# Patient Record
Sex: Male | Born: 1950
Health system: Southern US, Community
[De-identification: ages and names within clinical notes are randomized; demographics above are authoritative.]

## PROBLEM LIST (undated history)

## (undated) ENCOUNTER — Emergency Department (HOSPITAL_COMMUNITY): Discharge status: Against Medical Advice | Payer: Self-pay

## (undated) DIAGNOSIS — E039 Hypothyroidism, unspecified: Secondary | ICD-10-CM

## (undated) DIAGNOSIS — C61 Malignant neoplasm of prostate: Secondary | ICD-10-CM

## (undated) DIAGNOSIS — E119 Type 2 diabetes mellitus without complications: Secondary | ICD-10-CM

## (undated) DIAGNOSIS — R269 Unspecified abnormalities of gait and mobility: Secondary | ICD-10-CM

## (undated) DIAGNOSIS — I10 Essential (primary) hypertension: Secondary | ICD-10-CM

## (undated) DIAGNOSIS — T8781 Dehiscence of amputation stump: Secondary | ICD-10-CM

## (undated) DIAGNOSIS — I499 Cardiac arrhythmia, unspecified: Secondary | ICD-10-CM

## (undated) DIAGNOSIS — M109 Gout, unspecified: Secondary | ICD-10-CM

## (undated) DIAGNOSIS — I2510 Atherosclerotic heart disease of native coronary artery without angina pectoris: Secondary | ICD-10-CM

## (undated) DIAGNOSIS — Z9989 Dependence on other enabling machines and devices: Secondary | ICD-10-CM

## (undated) DIAGNOSIS — G959 Disease of spinal cord, unspecified: Secondary | ICD-10-CM

## (undated) DIAGNOSIS — R112 Nausea with vomiting, unspecified: Secondary | ICD-10-CM

## (undated) DIAGNOSIS — E1140 Type 2 diabetes mellitus with diabetic neuropathy, unspecified: Secondary | ICD-10-CM

## (undated) DIAGNOSIS — M86172 Other acute osteomyelitis, left ankle and foot: Secondary | ICD-10-CM

## (undated) DIAGNOSIS — E11621 Type 2 diabetes mellitus with foot ulcer: Secondary | ICD-10-CM

## (undated) DIAGNOSIS — Z87442 Personal history of urinary calculi: Secondary | ICD-10-CM

## (undated) DIAGNOSIS — I739 Peripheral vascular disease, unspecified: Secondary | ICD-10-CM

## (undated) DIAGNOSIS — K219 Gastro-esophageal reflux disease without esophagitis: Secondary | ICD-10-CM

## (undated) DIAGNOSIS — Z9289 Personal history of other medical treatment: Secondary | ICD-10-CM

## (undated) DIAGNOSIS — T8859XA Other complications of anesthesia, initial encounter: Secondary | ICD-10-CM

## (undated) DIAGNOSIS — E7800 Pure hypercholesterolemia, unspecified: Secondary | ICD-10-CM

## (undated) DIAGNOSIS — G629 Polyneuropathy, unspecified: Secondary | ICD-10-CM

## (undated) DIAGNOSIS — D649 Anemia, unspecified: Secondary | ICD-10-CM

## (undated) DIAGNOSIS — G709 Myoneural disorder, unspecified: Secondary | ICD-10-CM

## (undated) DIAGNOSIS — N189 Chronic kidney disease, unspecified: Secondary | ICD-10-CM

## (undated) DIAGNOSIS — Z9889 Other specified postprocedural states: Secondary | ICD-10-CM

## (undated) HISTORY — DX: Dehiscence of amputation stump: T87.81

## (undated) HISTORY — DX: Type 2 diabetes mellitus without complications: E11.9

## (undated) HISTORY — PX: BACK SURGERY: SHX140

## (undated) HISTORY — PX: NECK SURGERY: SHX720

## (undated) HISTORY — DX: Other acute osteomyelitis, left ankle and foot: M86.172

## (undated) HISTORY — PX: WISDOM TOOTH EXTRACTION: SHX21

## (undated) HISTORY — PX: CHOLECYSTECTOMY: SHX55

## (undated) HISTORY — DX: Disease of spinal cord, unspecified: G95.9

## (undated) HISTORY — DX: Type 2 diabetes mellitus with diabetic neuropathy, unspecified: E11.40

## (undated) HISTORY — PX: CATARACT EXTRACTION: SUR2

---
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
## 2006-04-08 HISTORY — PX: CORONARY ARTERY BYPASS GRAFT: SHX141

## 2009-04-08 HISTORY — PX: COLONOSCOPY: SHX174

## 2012-09-14 ENCOUNTER — Inpatient Hospital Stay (HOSPITAL_COMMUNITY)
Admission: EM | Admit: 2012-09-14 | Discharge: 2012-09-16 | DRG: 638 | Disposition: A | Discharge status: Home / Self Care | Payer: Managed Care, Other (non HMO) | Attending: Internal Medicine | Admitting: Internal Medicine

## 2012-09-14 ENCOUNTER — Inpatient Hospital Stay (HOSPITAL_COMMUNITY): Payer: Managed Care, Other (non HMO)

## 2012-09-14 ENCOUNTER — Emergency Department (HOSPITAL_COMMUNITY): Payer: Managed Care, Other (non HMO)

## 2012-09-14 ENCOUNTER — Encounter (HOSPITAL_COMMUNITY): Payer: Self-pay | Admitting: Emergency Medicine

## 2012-09-14 DIAGNOSIS — L02619 Cutaneous abscess of unspecified foot: Secondary | ICD-10-CM | POA: Diagnosis present

## 2012-09-14 DIAGNOSIS — M109 Gout, unspecified: Secondary | ICD-10-CM | POA: Diagnosis present

## 2012-09-14 DIAGNOSIS — Z79899 Other long term (current) drug therapy: Secondary | ICD-10-CM

## 2012-09-14 DIAGNOSIS — E1149 Type 2 diabetes mellitus with other diabetic neurological complication: Secondary | ICD-10-CM | POA: Diagnosis present

## 2012-09-14 DIAGNOSIS — I10 Essential (primary) hypertension: Secondary | ICD-10-CM | POA: Diagnosis present

## 2012-09-14 DIAGNOSIS — E119 Type 2 diabetes mellitus without complications: Secondary | ICD-10-CM | POA: Diagnosis present

## 2012-09-14 DIAGNOSIS — E1159 Type 2 diabetes mellitus with other circulatory complications: Secondary | ICD-10-CM | POA: Diagnosis present

## 2012-09-14 DIAGNOSIS — Z87891 Personal history of nicotine dependence: Secondary | ICD-10-CM

## 2012-09-14 DIAGNOSIS — E039 Hypothyroidism, unspecified: Secondary | ICD-10-CM | POA: Diagnosis present

## 2012-09-14 DIAGNOSIS — M216X9 Other acquired deformities of unspecified foot: Secondary | ICD-10-CM | POA: Diagnosis present

## 2012-09-14 DIAGNOSIS — E1142 Type 2 diabetes mellitus with diabetic polyneuropathy: Secondary | ICD-10-CM | POA: Diagnosis present

## 2012-09-14 DIAGNOSIS — G629 Polyneuropathy, unspecified: Secondary | ICD-10-CM | POA: Diagnosis present

## 2012-09-14 DIAGNOSIS — E11621 Type 2 diabetes mellitus with foot ulcer: Secondary | ICD-10-CM | POA: Diagnosis present

## 2012-09-14 DIAGNOSIS — E10621 Type 1 diabetes mellitus with foot ulcer: Secondary | ICD-10-CM

## 2012-09-14 DIAGNOSIS — E1165 Type 2 diabetes mellitus with hyperglycemia: Principal | ICD-10-CM | POA: Diagnosis present

## 2012-09-14 DIAGNOSIS — IMO0002 Reserved for concepts with insufficient information to code with codable children: Principal | ICD-10-CM | POA: Diagnosis present

## 2012-09-14 DIAGNOSIS — I2510 Atherosclerotic heart disease of native coronary artery without angina pectoris: Secondary | ICD-10-CM | POA: Diagnosis present

## 2012-09-14 DIAGNOSIS — E1169 Type 2 diabetes mellitus with other specified complication: Secondary | ICD-10-CM | POA: Diagnosis present

## 2012-09-14 DIAGNOSIS — L0390 Cellulitis, unspecified: Secondary | ICD-10-CM

## 2012-09-14 DIAGNOSIS — M9080 Osteopathy in diseases classified elsewhere, unspecified site: Secondary | ICD-10-CM | POA: Diagnosis present

## 2012-09-14 DIAGNOSIS — L97509 Non-pressure chronic ulcer of other part of unspecified foot with unspecified severity: Secondary | ICD-10-CM | POA: Diagnosis present

## 2012-09-14 DIAGNOSIS — L0291 Cutaneous abscess, unspecified: Secondary | ICD-10-CM

## 2012-09-14 DIAGNOSIS — Z951 Presence of aortocoronary bypass graft: Secondary | ICD-10-CM

## 2012-09-14 DIAGNOSIS — E7800 Pure hypercholesterolemia, unspecified: Secondary | ICD-10-CM | POA: Diagnosis present

## 2012-09-14 DIAGNOSIS — E785 Hyperlipidemia, unspecified: Secondary | ICD-10-CM | POA: Diagnosis present

## 2012-09-14 DIAGNOSIS — M869 Osteomyelitis, unspecified: Secondary | ICD-10-CM | POA: Diagnosis present

## 2012-09-14 DIAGNOSIS — F40298 Other specified phobia: Secondary | ICD-10-CM | POA: Diagnosis present

## 2012-09-14 HISTORY — DX: Atherosclerotic heart disease of native coronary artery without angina pectoris: I25.10

## 2012-09-14 HISTORY — DX: Essential (primary) hypertension: I10

## 2012-09-14 HISTORY — DX: Pure hypercholesterolemia, unspecified: E78.00

## 2012-09-14 HISTORY — DX: Polyneuropathy, unspecified: G62.9

## 2012-09-14 LAB — CBC WITH DIFFERENTIAL/PLATELET
Eosinophils Absolute: 0.1 10*3/uL (ref 0.0–0.7)
Eosinophils Relative: 2 % (ref 0–5)
HCT: 37.2 % — ABNORMAL LOW (ref 39.0–52.0)
Hemoglobin: 13 g/dL (ref 13.0–17.0)
Lymphs Abs: 1 10*3/uL (ref 0.7–4.0)
MCH: 29.3 pg (ref 26.0–34.0)
MCV: 83.8 fL (ref 78.0–100.0)
Monocytes Absolute: 0.5 10*3/uL (ref 0.1–1.0)
Monocytes Relative: 8 % (ref 3–12)
Platelets: 152 10*3/uL (ref 150–400)
RBC: 4.44 MIL/uL (ref 4.22–5.81)

## 2012-09-14 LAB — BASIC METABOLIC PANEL
BUN: 33 mg/dL — ABNORMAL HIGH (ref 6–23)
CO2: 26 mEq/L (ref 19–32)
Calcium: 9.3 mg/dL (ref 8.4–10.5)
Creatinine, Ser: 1.74 mg/dL — ABNORMAL HIGH (ref 0.50–1.35)
GFR calc non Af Amer: 40 mL/min — ABNORMAL LOW (ref >90)
Glucose, Bld: 291 mg/dL — ABNORMAL HIGH (ref 70–99)

## 2012-09-14 MED ORDER — PIPERACILLIN-TAZOBACTAM 3.375 G IVPB
3.3750 g | Freq: Once | INTRAVENOUS | Status: AC
  Administered 2012-09-14: 3.375 g via INTRAVENOUS
  Filled 2012-09-14: qty 50

## 2012-09-14 MED ORDER — ACETAMINOPHEN 650 MG RE SUPP: 650.0000 mg | Freq: Four times a day (QID) | RECTAL | Status: DC | PRN

## 2012-09-14 MED ORDER — VANCOMYCIN HCL IN DEXTROSE 1-5 GM/200ML-% IV SOLN
1000.0000 mg | Freq: Once | INTRAVENOUS | Status: AC
  Administered 2012-09-14: 1000 mg via INTRAVENOUS
  Filled 2012-09-14: qty 200

## 2012-09-14 MED ORDER — ATORVASTATIN CALCIUM 20 MG PO TABS
20.0000 mg | ORAL_TABLET | Freq: Every day | ORAL | Status: DC
  Administered 2012-09-15: 20 mg via ORAL
  Filled 2012-09-14 (×2): qty 1

## 2012-09-14 MED ORDER — AMLODIPINE BESYLATE 5 MG PO TABS
5.0000 mg | ORAL_TABLET | Freq: Every day | ORAL | Status: DC
  Administered 2012-09-15: 5 mg via ORAL
  Filled 2012-09-14: qty 1

## 2012-09-14 MED ORDER — CIPROFLOXACIN IN D5W 400 MG/200ML IV SOLN
400.0000 mg | Freq: Two times a day (BID) | INTRAVENOUS | Status: DC
  Administered 2012-09-15 – 2012-09-16 (×4): 400 mg via INTRAVENOUS
  Filled 2012-09-14 (×5): qty 200

## 2012-09-14 MED ORDER — ONDANSETRON HCL 4 MG/2ML IJ SOLN: 4.0000 mg | Freq: Four times a day (QID) | INTRAMUSCULAR | Status: DC | PRN

## 2012-09-14 MED ORDER — LEVOTHYROXINE SODIUM 75 MCG PO TABS
75.0000 ug | ORAL_TABLET | Freq: Every day | ORAL | Status: DC
  Filled 2012-09-14 (×3): qty 1

## 2012-09-14 MED ORDER — AMLODIPINE BESY-BENAZEPRIL HCL 5-10 MG PO CAPS: 1.0000 | ORAL_CAPSULE | Freq: Every day | ORAL | Status: DC

## 2012-09-14 MED ORDER — BENAZEPRIL HCL 10 MG PO TABS
10.0000 mg | ORAL_TABLET | Freq: Every day | ORAL | Status: DC
  Administered 2012-09-15 – 2012-09-16 (×2): 10 mg via ORAL
  Filled 2012-09-14 (×2): qty 1

## 2012-09-14 MED ORDER — ALLOPURINOL 300 MG PO TABS
300.0000 mg | ORAL_TABLET | Freq: Every day | ORAL | Status: DC
  Administered 2012-09-15 – 2012-09-16 (×2): 300 mg via ORAL
  Filled 2012-09-14 (×2): qty 1

## 2012-09-14 MED ORDER — VANCOMYCIN HCL 500 MG IV SOLR
500.0000 mg | Freq: Once | INTRAVENOUS | Status: AC
  Administered 2012-09-15: 500 mg via INTRAVENOUS
  Filled 2012-09-14: qty 500

## 2012-09-14 MED ORDER — ASPIRIN 81 MG PO CHEW
81.0000 mg | CHEWABLE_TABLET | Freq: Every day | ORAL | Status: DC
  Administered 2012-09-15 – 2012-09-16 (×2): 81 mg via ORAL
  Filled 2012-09-14 (×2): qty 1

## 2012-09-14 MED ORDER — GABAPENTIN 600 MG PO TABS
1200.0000 mg | ORAL_TABLET | Freq: Two times a day (BID) | ORAL | Status: DC
  Administered 2012-09-15 – 2012-09-16 (×4): 1200 mg via ORAL
  Filled 2012-09-14 (×5): qty 2

## 2012-09-14 MED ORDER — SODIUM CHLORIDE 0.9 % IV SOLN
INTRAVENOUS | Status: AC
  Administered 2012-09-15 (×2): via INTRAVENOUS

## 2012-09-14 MED ORDER — ONDANSETRON HCL 4 MG PO TABS: 4.0000 mg | ORAL_TABLET | Freq: Four times a day (QID) | ORAL | Status: DC | PRN

## 2012-09-14 MED ORDER — INSULIN ASPART 100 UNIT/ML ~~LOC~~ SOLN
0.0000 [IU] | Freq: Three times a day (TID) | SUBCUTANEOUS | Status: DC
  Administered 2012-09-15: 3 [IU] via SUBCUTANEOUS
  Administered 2012-09-15: 7 [IU] via SUBCUTANEOUS
  Administered 2012-09-15: 2 [IU] via SUBCUTANEOUS
  Administered 2012-09-16: 3 [IU] via SUBCUTANEOUS

## 2012-09-14 MED ORDER — ACETAMINOPHEN 325 MG PO TABS
650.0000 mg | ORAL_TABLET | Freq: Four times a day (QID) | ORAL | Status: DC | PRN
  Administered 2012-09-15 – 2012-09-16 (×2): 650 mg via ORAL
  Filled 2012-09-14: qty 1
  Filled 2012-09-14: qty 2

## 2012-09-14 MED ORDER — VANCOMYCIN HCL 10 G IV SOLR
1500.0000 mg | INTRAVENOUS | Status: DC
  Administered 2012-09-16: 1500 mg via INTRAVENOUS
  Filled 2012-09-14 (×2): qty 1500

## 2012-09-14 MED ORDER — PANTOPRAZOLE SODIUM 40 MG PO TBEC
40.0000 mg | DELAYED_RELEASE_TABLET | Freq: Every day | ORAL | Status: DC
  Administered 2012-09-15: 40 mg via ORAL

## 2012-09-14 NOTE — Progress Notes (Signed)
ANTIBIOTIC CONSULT NOTE - INITIAL  Pharmacy Consult for Vancocin and Cipro Indication: cellulitis  Allergies  Allergen Reactions  . Mushroom Extract Complex Nausea Only    Patient Measurements: Height: 5\' 10"  (177.8 cm) Weight: 225 lb (102.059 kg) IBW/kg (Calculated) : 73  Vital Signs: Temp: 98.6 F (37 C) (06/09 2303) Temp src: Oral (06/09 2303) BP: 156/91 mmHg (06/09 2303) Pulse Rate: 80 (06/09 2303)  Labs:  Recent Labs  09/14/12 1855  WBC 6.8  HGB 13.0  PLT 152  CREATININE 1.74*   Estimated Creatinine Clearance: 52 ml/min (by C-G formula based on Cr of 1.74).   Microbiology: No results found for this or any previous visit (from the past 720 hour(s)).  Medical History: Past Medical History  Diagnosis Date  . Coronary artery disease   . Hypertension   . Hypercholesteremia   . Neuropathy     Medications:  Prescriptions prior to admission  Medication Sig Dispense Refill  . allopurinol (ZYLOPRIM) 300 MG tablet Take 300 mg by mouth daily.      Marland Kitchen amLODipine-benazepril (LOTREL) 5-10 MG per capsule Take 1 capsule by mouth daily.      Marland Kitchen aspirin 81 MG chewable tablet Chew 81 mg by mouth daily.      . celecoxib (CELEBREX) 100 MG capsule Take 100 mg by mouth daily.      Marland Kitchen esomeprazole (NEXIUM) 40 MG capsule Take 40 mg by mouth daily before breakfast.      . hydrochlorothiazide (HYDRODIURIL) 25 MG tablet Take 25 mg by mouth daily.      Marland Kitchen levothyroxine (SYNTHROID, LEVOTHROID) 75 MCG tablet Take 75 mcg by mouth daily before breakfast.      . metFORMIN (GLUCOPHAGE) 500 MG tablet Take 500 mg by mouth 2 (two) times daily with a meal.      . Pitavastatin Calcium (LIVALO) 4 MG TABS Take 4 mg by mouth 2 (two) times a week.      . tadalafil (CIALIS) 20 MG tablet Take 20 mg by mouth daily as needed for erectile dysfunction.       Scheduled:  . [START ON 09/15/2012] allopurinol  300 mg Oral Daily  . [START ON 09/15/2012] amLODipine  5 mg Oral Daily  . [START ON 09/15/2012]  aspirin  81 mg Oral Daily  . [START ON 09/15/2012] atorvastatin  20 mg Oral q1800  . [START ON 09/15/2012] benazepril  10 mg Oral Daily  . ciprofloxacin  400 mg Intravenous Q12H  . [START ON 09/15/2012] insulin aspart  0-9 Units Subcutaneous TID WC  . [START ON 09/15/2012] levothyroxine  75 mcg Oral QAC breakfast  . [START ON 09/15/2012] pantoprazole  40 mg Oral Q1200  . piperacillin-tazobactam (ZOSYN)  IV  3.375 g Intravenous Once  . [START ON 09/15/2012] vancomycin  1,500 mg Intravenous Q24H  . vancomycin  1,000 mg Intravenous Once   Infusions:  . sodium chloride      Assessment: 62yo male c/o left foot pain and ulcer x6wk, pt says today wound worsened with pus and redness/warmth, to begin IV ABX for cellulitis.  Goal of Therapy:  Vancomycin trough level 10-15 mcg/ml  Plan:  Rec'd Zosyn 3.375g IV in ED; will begin vancomycin 1500mg  IV Q24H and Cipro 400mg  IV Q12H and monitor CBC, Cx, levels prn.  Wynona Neat, PharmD, BCPS  09/14/2012,11:05 PM

## 2012-09-14 NOTE — ED Provider Notes (Signed)
History     CSN: [id number]  Arrival date & time 09/14/12  1801   First MD Initiated Contact with Patient 09/14/12 1814      Chief Complaint  Patient presents with  . Foot Pain    (Consider location/radiation/quality/duration/timing/severity/associated sxs/prior treatment) HPI Comments: Patient presents to the ED for a left foot wound infection. States ulcer has been present for the past 6 weeks secondary to abnormal gait from neuropathy. He currently sees podiatry, Dr. Prudence Davidson.  Patient had an appointment earlier today, wound was debrided, cultures obtained and he was sent to the ED for further evaluation.  Patient recently returned from [place] on a business trip. States while he was there wound remained dressed and he wore socks and cam walker the entire time.  He states wound was not exposed and was in a hotel. He admits he did not perform proper wound care while away.  Now there is redness surrounding his third, fourth, and fifth left toes and extending into the volar surface of his foot. This is new and was not present before his trip.  Patient denies any recent fevers, sweats, or chills. No history of osteomyelitis.  The history is provided by the patient.    Past Medical History  Diagnosis Date  . Coronary artery disease   . Hypertension   . Hypercholesteremia   . Neuropathy     History reviewed. No pertinent past surgical history.  History reviewed. No pertinent family history.  History  Substance Use Topics  . Smoking status: Never Smoker   . Smokeless tobacco: Not on file  . Alcohol Use: No      Review of Systems  Skin: Positive for wound.  All other systems reviewed and are negative.    Allergies  Review of patient's allergies indicates no known allergies.  Home Medications  No current outpatient prescriptions on file.  BP 133/74  Pulse 87  Temp(Src) 98.2 F (36.8 C) (Oral)  Resp 18  SpO2 98%  Physical Exam  Nursing note and vitals  reviewed. Constitutional: He is oriented to person, place, and time. He appears well-developed and well-nourished.  HENT:  Head: Normocephalic and atraumatic.  Eyes: Conjunctivae and EOM are normal. Pupils are equal, round, and reactive to light.  Neck: Normal range of motion. Neck supple.  Cardiovascular: Normal rate, regular rhythm and normal heart sounds.   Pulmonary/Chest: Effort normal and breath sounds normal. No respiratory distress.  Musculoskeletal: Normal range of motion.  Ulcer on sole of left lateral foot, purulent drainage present with strong odor, cellulitis extending into 3rd-5th left toes and volar surface of foot  Neurological: He is alert and oriented to person, place, and time.  Skin: Skin is warm and dry.  Psychiatric: He has a normal mood and affect.        ED Course  Procedures (including critical care time)  Labs Reviewed  CBC WITH DIFFERENTIAL - Abnormal; Notable for the following:    HCT 37.2 (*)    All other components within normal limits  BASIC METABOLIC PANEL - Abnormal; Notable for the following:    Glucose, Bld 291 (*)    BUN 33 (*)    Creatinine, Ser 1.74 (*)    GFR calc non Af Amer 40 (*)    GFR calc Af Amer 46 (*)    All other components within normal limits  COMPREHENSIVE METABOLIC PANEL - Abnormal; Notable for the following:    Glucose, Bld 242 (*)    BUN 28 (*)  Creatinine, Ser 1.68 (*)    Albumin 3.2 (*)    GFR calc non Af Amer 42 (*)    GFR calc Af Amer 48 (*)    All other components within normal limits  HEMOGLOBIN A1C - Abnormal; Notable for the following:    Hemoglobin A1C 10.0 (*)    Mean Plasma Glucose 240 (*)    All other components within normal limits  CBC WITH DIFFERENTIAL - Abnormal; Notable for the following:    Hemoglobin 12.5 (*)    HCT 35.7 (*)    All other components within normal limits  GLUCOSE, CAPILLARY - Abnormal; Notable for the following:    Glucose-Capillary 328 (*)    All other components within normal  limits  URINALYSIS, ROUTINE W REFLEX MICROSCOPIC - Abnormal; Notable for the following:    Protein, ur 100 (*)    All other components within normal limits  GLUCOSE, CAPILLARY - Abnormal; Notable for the following:    Glucose-Capillary 217 (*)    All other components within normal limits  GLUCOSE, CAPILLARY - Abnormal; Notable for the following:    Glucose-Capillary 304 (*)    All other components within normal limits  SEDIMENTATION RATE - Abnormal; Notable for the following:    Sed Rate 53 (*)    All other components within normal limits  GLUCOSE, CAPILLARY - Abnormal; Notable for the following:    Glucose-Capillary 199 (*)    All other components within normal limits  BASIC METABOLIC PANEL - Abnormal; Notable for the following:    Glucose, Bld 268 (*)    BUN 25 (*)    Creatinine, Ser 1.60 (*)    GFR calc non Af Amer 44 (*)    GFR calc Af Amer 51 (*)    All other components within normal limits  CBC - Abnormal; Notable for the following:    Hemoglobin 12.7 (*)    HCT 36.9 (*)    All other components within normal limits  GLUCOSE, CAPILLARY - Abnormal; Notable for the following:    Glucose-Capillary 319 (*)    All other components within normal limits  GLUCOSE, CAPILLARY - Abnormal; Notable for the following:    Glucose-Capillary 247 (*)    All other components within normal limits  CULTURE, BLOOD (ROUTINE X 2)  CULTURE, BLOOD (ROUTINE X 2)  TSH  URINE MICROSCOPIC-ADD ON  SODIUM, URINE, RANDOM  CREATININE, URINE, RANDOM   No results found.   1. Type 1 diabetes mellitus with diabetic foot ulcer   2. Cellulitis   3. Neuropathy   4. Diabetic foot ulcer   5. HTN (hypertension)   6. Hypothyroidism   7. Diabetes mellitus   8. Hypercholesteremia   9. Gout   10. Hyperlipidemia   11. CAD (coronary artery disease)       MDM   Labs as above.  X-ray negative for osteomyelitis.  Wound culture performed earlier today, blood cultures pending.  Started on zosyn.  Pt will  need to be admitted for continued IV abx and wound care.  Consulted to unassigned internal medicine pending.  Signed out to gail schulz NP for dispo and temp admit orders.        Larene Pickett, PA-C 09/16/12 1056

## 2012-09-14 NOTE — ED Notes (Signed)
Pt c/o left foot pain and wound x 6 weeks that thinks is infected; pt sts increased pain

## 2012-09-14 NOTE — H&P (Signed)
[facility] History and Physical  [name] [id number] DOB: 1951-02-14 DOA: 09/14/2012  Referring physician: ER physician. PCP: No primary provider on file. patient has just moved from [facility]. Specialists: None.  Chief Complaint: Left foot also with erythema and discharge.  HPI: [name] is a 62 y.o. male with history of hypertension, diabetes mellitus, CAD status post CABG, hyperlipidemia and hypothyroidism has been noticing some redness and ulceration on his leg 6 weeks ago. He had gone to podiatrist in the ER and was instructed to decrease weight on the leg. Eventually patient started developing some pain and ulceration but worse had gone to another podiatrist and at this time was given diabetic foot wear. Patient recently went to [place] and back when he started developing further worsening of the ulcer with active discharge and erythema involving his foot. He had gone to a podiatrist today and had gone debridement was instructed to to the ER. X-rays don't show anything acute patient has been admitted for cellulitis with diabetic foot ulcer. Patient denies any fever chills chest pain shortness of breath.   Review of Systems: As presented in the history of presenting illness, rest negative.  Past Medical History  Diagnosis Date  . Coronary artery disease   . Hypertension   . Hypercholesteremia   . Neuropathy    Past Surgical History  Procedure Laterality Date  . Coronary artery bypass graft    . Cholecystectomy    . Back surgery     Social History:  reports that he has quit smoking. He does not have any smokeless tobacco history on file. He reports that he does not drink alcohol or use illicit drugs. Home.  where does patient live-- Can do ADLs. Can patient participate in ADLs?  Allergies  Allergen Reactions  . Mushroom Extract Complex Nausea Only    Family History  Problem Relation Age of Onset  . Diabetes Mellitus II Mother   . Diabetes Mellitus II Father    . CAD Father       Prior to Admission medications   Medication Sig Start Date End Date Taking? Authorizing Provider  allopurinol (ZYLOPRIM) 300 MG tablet Take 300 mg by mouth daily.   Yes Historical Provider, MD  amLODipine-benazepril (LOTREL) 5-10 MG per capsule Take 1 capsule by mouth daily.   Yes Historical Provider, MD  aspirin 81 MG chewable tablet Chew 81 mg by mouth daily.   Yes Historical Provider, MD  celecoxib (CELEBREX) 100 MG capsule Take 100 mg by mouth daily.   Yes Historical Provider, MD  esomeprazole (NEXIUM) 40 MG capsule Take 40 mg by mouth daily before breakfast.   Yes Historical Provider, MD  hydrochlorothiazide (HYDRODIURIL) 25 MG tablet Take 25 mg by mouth daily.   Yes Historical Provider, MD  levothyroxine (SYNTHROID, LEVOTHROID) 75 MCG tablet Take 75 mcg by mouth daily before breakfast.   Yes Historical Provider, MD  metFORMIN (GLUCOPHAGE) 500 MG tablet Take 500 mg by mouth 2 (two) times daily with a meal.   Yes Historical Provider, MD  Pitavastatin Calcium (LIVALO) 4 MG TABS Take 4 mg by mouth 2 (two) times a week.   Yes Historical Provider, MD  tadalafil (CIALIS) 20 MG tablet Take 20 mg by mouth daily as needed for erectile dysfunction.   Yes Historical Provider, MD   Physical Exam: Filed Vitals:   09/14/12 1812 09/14/12 2019  BP: 133/74 154/69  Pulse: 87 82  Temp: 98.2 F (36.8 C) 98.2 F (36.8 C)  TempSrc: Oral Oral  Resp: 18 16  Height:  5\' 10"  (1.778 m)  Weight:  102.059 kg (225 lb)  SpO2: 98% 98%     General:  Well-developed and nourished.   Eyes:Anicteric no pallor.  ENT: No discharge from the ears eyes nose mouth.   Neck: No mass felt.   Cardiovascular: S1-S2 heard.   Respiratory: No rhonchi or crepitations.   Abdomen: Soft nontender bowel sounds present.   Skin: There is erythema of his left foot with 2 cm ulceration on the plantar aspect of his left foot. Mild discharge. Good pulsations.   Musculoskeletal: See skin description.    Psychiatric: Appears normal.   Neurologic: Alert awake oriented to time place and person. Moves all extremities. Has left foot drop.   Labs on Admission:  Basic Metabolic Panel:  Recent Labs Lab 09/14/12 1855  NA 135  K 3.9  CL 97  CO2 26  GLUCOSE 291*  BUN 33*  CREATININE 1.74*  CALCIUM 9.3   Liver Function Tests: No results found for this basename: AST, ALT, ALKPHOS, BILITOT, PROT, ALBUMIN,  in the last 168 hours No results found for this basename: LIPASE, AMYLASE,  in the last 168 hours No results found for this basename: AMMONIA,  in the last 168 hours CBC:  Recent Labs Lab 09/14/12 1855  WBC 6.8  NEUTROABS 5.2  HGB 13.0  HCT 37.2*  MCV 83.8  PLT 152   Cardiac Enzymes: No results found for this basename: CKTOTAL, CKMB, CKMBINDEX, TROPONINI,  in the last 168 hours  BNP (last 3 results) No results found for this basename: PROBNP,  in the last 8760 hours CBG: No results found for this basename: GLUCAP,  in the last 168 hours  Radiological Exams on Admission: Dg Foot Complete Left  09/14/2012   *RADIOLOGY REPORT*  Clinical Data: Nonhealing left foot ulcer.  LEFT FOOT - COMPLETE 3+ VIEW  Comparison: None.  Findings: There is soft tissue swelling at the base of the fifth toe along the outer aspect of the fifth MTP joint.  No associated fracture or bony destruction is seen.  No foreign body is identified in the soft tissues.  There are mild degenerative changes of the toes.  IMPRESSION: No overt evidence of osteomyelitis or fracture.  No soft tissue foreign body is identified.  Soft tissue swelling is seen adjacent to the fifth toe.   Original Report Authenticated By: Aletta Edouard, M.D.     Assessment/Plan Principal Problem:   Cellulitis Active Problems:   Hypercholesteremia   Neuropathy   Diabetic foot ulcer   Gout   HTN (hypertension)   Hyperlipidemia   Hypothyroidism   1. Cellulitis of the left foot with diabetic foot ulcer - patient has been placed  on empiric antibiotics and CT of the left foot has been ordered as patient is very claustrophobic to get MRI. Further recommendations after CT results. Wound consult.  2. Diabetes mellitus type 2  - closely follow CBGs.  3. Hypertension  - continue home medications.  4. Possible chronic kidney disease - closely follow metabolic panel. We do not have baseline creatinine to compare. Check UA. 5. Hypothyroidism  - continue home medications.  6. Hyperlipidemia - continue home medications. 7. History of CAD status post CABG - denies any chest pain. 8. History of gout.    Code Status: Full code.   Family Communication: Patient's wife at the bedside.   Disposition Plan: Admit to inpatient.     Corina Stacy N. [facility] Pager [phone].   If  7PM-7AM, please contact night-coverage [email or web address] Password [facility] 09/14/2012, 9:51 PM

## 2012-09-14 NOTE — ED Provider Notes (Signed)
Medical screening examination/treatment/procedure(s) were conducted as a shared visit with non-physician practitioner(s) and myself.  I personally evaluated the patient during the encounter  Pt seen and examined, diabetic foot ulcer noted with cellulitis, pt to be admitted  Leota Jacobsen, MD 09/14/12 1948

## 2012-09-14 NOTE — ED Provider Notes (Signed)
  Physical Exam  BP 154/69  Pulse 82  Temp(Src) 98.2 F (36.8 C) (Oral)  Resp 16  Ht 5\' 10"  (1.778 m)  Wt 225 lb (102.059 kg)  BMI 32.28 kg/m2  SpO2 98%  Physical Exam Patient with diabetic foot ulcer has been followed by dietary sent to the emergency department for worsening symptoms and cellulitis.  After evaluation by Dr. Zenia Resides and Quincy Carnes was deemed that he was to be admitted for IV antibiotics.  He received 3.375 g of Zosyn.  [facility]  requested.  He receive additional vancomycin, which has been ordered ED Course  Procedures  MDM  Her hospital is requested admitted to [place] team to      Garald Balding, NP 09/14/12 2034

## 2012-09-14 NOTE — ED Notes (Signed)
C/o worsening,, increasing size of ulcer to bottom of left foot x 6 weeks. States today noticed pus & top of foot red & warm to touch. Denies fever, chills.

## 2012-09-15 DIAGNOSIS — E119 Type 2 diabetes mellitus without complications: Secondary | ICD-10-CM

## 2012-09-15 LAB — CBC WITH DIFFERENTIAL/PLATELET
Basophils Absolute: 0 10*3/uL (ref 0.0–0.1)
Eosinophils Absolute: 0.2 10*3/uL (ref 0.0–0.7)
Eosinophils Relative: 3 % (ref 0–5)
HCT: 35.7 % — ABNORMAL LOW (ref 39.0–52.0)
Lymphocytes Relative: 17 % (ref 12–46)
Lymphs Abs: 0.9 10*3/uL (ref 0.7–4.0)
MCH: 28.9 pg (ref 26.0–34.0)
MCV: 82.6 fL (ref 78.0–100.0)
Monocytes Absolute: 0.6 10*3/uL (ref 0.1–1.0)
RDW: 13.3 % (ref 11.5–15.5)
WBC: 5.2 10*3/uL (ref 4.0–10.5)

## 2012-09-15 LAB — COMPREHENSIVE METABOLIC PANEL
ALT: 28 U/L (ref 0–53)
AST: 22 U/L (ref 0–37)
Alkaline Phosphatase: 63 U/L (ref 39–117)
CO2: 26 mEq/L (ref 19–32)
Calcium: 8.8 mg/dL (ref 8.4–10.5)
Chloride: 98 mEq/L (ref 96–112)
GFR calc non Af Amer: 42 mL/min — ABNORMAL LOW (ref >90)
Glucose, Bld: 242 mg/dL — ABNORMAL HIGH (ref 70–99)
Potassium: 3.6 mEq/L (ref 3.5–5.1)
Sodium: 136 mEq/L (ref 135–145)
Total Bilirubin: 0.6 mg/dL (ref 0.3–1.2)

## 2012-09-15 LAB — URINALYSIS, ROUTINE W REFLEX MICROSCOPIC
Glucose, UA: NEGATIVE mg/dL
Hgb urine dipstick: NEGATIVE
Specific Gravity, Urine: 1.012 (ref 1.005–1.030)

## 2012-09-15 LAB — URINE MICROSCOPIC-ADD ON

## 2012-09-15 LAB — GLUCOSE, CAPILLARY: Glucose-Capillary: 217 mg/dL — ABNORMAL HIGH (ref 70–99)

## 2012-09-15 MED ORDER — AMLODIPINE BESYLATE 10 MG PO TABS
10.0000 mg | ORAL_TABLET | Freq: Every day | ORAL | Status: DC
  Administered 2012-09-16: 10 mg via ORAL
  Filled 2012-09-15: qty 1

## 2012-09-15 MED ORDER — INSULIN GLARGINE 100 UNIT/ML ~~LOC~~ SOLN
10.0000 [IU] | Freq: Every day | SUBCUTANEOUS | Status: DC
  Administered 2012-09-15 – 2012-09-16 (×2): 10 [IU] via SUBCUTANEOUS
  Filled 2012-09-15 (×2): qty 0.1

## 2012-09-15 MED ORDER — AMLODIPINE BESYLATE 5 MG PO TABS
5.0000 mg | ORAL_TABLET | Freq: Once | ORAL | Status: AC
  Administered 2012-09-15: 5 mg via ORAL
  Filled 2012-09-15 (×2): qty 1

## 2012-09-15 MED ORDER — COLLAGENASE 250 UNIT/GM EX OINT
TOPICAL_OINTMENT | Freq: Every day | CUTANEOUS | Status: DC
  Administered 2012-09-15 – 2012-09-16 (×2): via TOPICAL
  Filled 2012-09-15: qty 30

## 2012-09-15 MED ORDER — INSULIN PEN STARTER KIT
1.0000 | Freq: Once | Status: AC
  Administered 2012-09-15: 1
  Filled 2012-09-15: qty 1

## 2012-09-15 MED ORDER — LIVING WELL WITH DIABETES BOOK
Freq: Once | Status: AC
  Administered 2012-09-15: 21:00:00
  Filled 2012-09-15 (×2): qty 1

## 2012-09-15 MED ORDER — ZOLPIDEM TARTRATE 5 MG PO TABS: 5.0000 mg | ORAL_TABLET | Freq: Every evening | ORAL | Status: DC | PRN

## 2012-09-15 NOTE — Progress Notes (Signed)
UR COMPLETED  

## 2012-09-15 NOTE — Progress Notes (Signed)
Patient requesting sleeping pill, call placed to Dr. Hal Hope.

## 2012-09-15 NOTE — Plan of Care (Signed)
Problem: Food- and Nutrition-Related Knowledge Deficit (NB-1.1) Goal: Nutrition education Formal process to instruct or train a patient/client in a skill or to impart knowledge to help patients/clients voluntarily manage or modify food choices and eating behavior to maintain or improve health. Outcome: Completed/Met Date Met:  09/15/12  RD consulted for nutrition education regarding diabetes. Pt reports that all of his family members have diabetes. He notes that both of his parents died from DM complications and both were on HD. Pt reports that he eats fairly well and that his blood pressure and cholesterol are in control because he takes medications to help him. Pt reports eating large steak 3-4 nights a week. Not willing to change his red meat consumption because he has been eating that way for quite some time and doesn't see anything wrong with it because his weight has been stable. Pt admits that he eats "copious" amounts of salt however he states that he is also unwilling to change that because his blood pressures are "great" and are managed by his blood pressure pills. Discussed with patient that HTN and DM complications can result in renal issues however pt doesn't seem concerned. Expect very poor compliance when d/c.    Lab Results  Component Value Date    HGBA1C 10.0* 09/15/2012    RD provided "Carbohydrate Counting for People with Diabetes" handout from the Academy of Nutrition and Dietetics. Discussed different food groups and their effects on blood sugar, emphasizing carbohydrate-containing foods. Provided list of carbohydrates and recommended serving sizes of common foods.  Discussed importance of controlled and consistent carbohydrate intake throughout the day. Provided examples of ways to balance meals/snacks and encouraged intake of high-fiber, whole grain complex carbohydrates. Teach back method used.  Expect poor compliance.  Body mass index is 32.28 kg/(m^2). Pt meets criteria for  Obese Class I based on current BMI.  Current diet order is Heart Healthy, patient is consuming approximately 100% of meals at this time. Labs and medications reviewed. No further nutrition interventions warranted at this time. RD contact information provided. If additional nutrition issues arise, please re-consult RD.  Inda Coke MS, RD, LDN Pager: [phone] After-hours pager: [phone]

## 2012-09-15 NOTE — Progress Notes (Signed)
Orthopedic Tech Progress Note Patient Details:  [name] [birth year] [id number]  Patient ID: [name], male   DOB: 1950/10/02, 62 y.o.   MRN: [id number]   [name] 09/15/2012, 3:36 PM  Left offloading boot completed by [employer].

## 2012-09-15 NOTE — Progress Notes (Signed)
Orthopedic Tech Progress Note Patient Details:  [name] 17-Oct-1950 [id number]  Patient ID: [name], male   DOB: 10/03/1950, 62 y.o.   MRN: [id number]   [name] 09/15/2012, 12:53 PM Called [employer] for left offloading boot.

## 2012-09-15 NOTE — Consult Note (Signed)
Reason for Consult:left foot  5th MT head diabetic ulcer Wagner stage 3 vs 4 Referring Physician: Hal Hope md  [name] is an 62 y.o. male.  HPI: 6 wk hx of ulcer debrided by pediatrist with worsening, CT scan shows some small area of bone involvement suspicious for early osteomyelitis.  Hx CAD, states he has had borderline diabetes for years .  Admission A1C is 10 .   I discussed with him that this is not borderline. Started Cipro and Vanc 6/9  Past Medical History  Diagnosis Date  . Coronary artery disease   . Hypertension   . Hypercholesteremia   . Neuropathy     Past Surgical History  Procedure Laterality Date  . Coronary artery bypass graft    . Cholecystectomy    . Back surgery      Family History  Problem Relation Age of Onset  . Diabetes Mellitus II Mother   . Diabetes Mellitus II Father   . CAD Father     Social History:  reports that he has quit smoking. He does not have any smokeless tobacco history on file. He reports that he does not drink alcohol or use illicit drugs.  Allergies:  Allergies  Allergen Reactions  . Mushroom Extract Complex Nausea Only    Medications: I have reviewed the patient's current medications.  Results for orders placed during the hospital encounter of 09/14/12 (from the past 48 hour(s))  CBC WITH DIFFERENTIAL     Status: Abnormal   Collection Time    09/14/12  6:55 PM      Result Value Range   WBC 6.8  4.0 - 10.5 K/uL   RBC 4.44  4.22 - 5.81 MIL/uL   Hemoglobin 13.0  13.0 - 17.0 g/dL   HCT 37.2 (*) 39.0 - 52.0 %   MCV 83.8  78.0 - 100.0 fL   MCH 29.3  26.0 - 34.0 pg   MCHC 34.9  30.0 - 36.0 g/dL   RDW 13.6  11.5 - 15.5 %   Platelets 152  150 - 400 K/uL   Neutrophils Relative % 76  43 - 77 %   Neutro Abs 5.2  1.7 - 7.7 K/uL   Lymphocytes Relative 14  12 - 46 %   Lymphs Abs 1.0  0.7 - 4.0 K/uL   Monocytes Relative 8  3 - 12 %   Monocytes Absolute 0.5  0.1 - 1.0 K/uL   Eosinophils Relative 2  0 - 5 %   Eosinophils  Absolute 0.1  0.0 - 0.7 K/uL   Basophils Relative 0  0 - 1 %   Basophils Absolute 0.0  0.0 - 0.1 K/uL  BASIC METABOLIC PANEL     Status: Abnormal   Collection Time    09/14/12  6:55 PM      Result Value Range   Sodium 135  135 - 145 mEq/L   Potassium 3.9  3.5 - 5.1 mEq/L   Chloride 97  96 - 112 mEq/L   CO2 26  19 - 32 mEq/L   Glucose, Bld 291 (*) 70 - 99 mg/dL   BUN 33 (*) 6 - 23 mg/dL   Creatinine, Ser 1.74 (*) 0.50 - 1.35 mg/dL   Calcium 9.3  8.4 - 10.5 mg/dL   GFR calc non Af Amer 40 (*) >90 mL/min   GFR calc Af Amer 46 (*) >90 mL/min   Comment:            The eGFR has been calculated  using the CKD EPI equation.     This calculation has not been     validated in all clinical     situations.     eGFR's persistently     <90 mL/min signify     possible Chronic Kidney Disease.  GLUCOSE, CAPILLARY     Status: Abnormal   Collection Time    09/14/12 10:52 PM      Result Value Range   Glucose-Capillary 328 (*) 70 - 99 mg/dL  COMPREHENSIVE METABOLIC PANEL     Status: Abnormal   Collection Time    09/15/12  5:20 AM      Result Value Range   Sodium 136  135 - 145 mEq/L   Potassium 3.6  3.5 - 5.1 mEq/L   Chloride 98  96 - 112 mEq/L   CO2 26  19 - 32 mEq/L   Glucose, Bld 242 (*) 70 - 99 mg/dL   BUN 28 (*) 6 - 23 mg/dL   Creatinine, Ser 1.68 (*) 0.50 - 1.35 mg/dL   Calcium 8.8  8.4 - 10.5 mg/dL   Total Protein 6.2  6.0 - 8.3 g/dL   Albumin 3.2 (*) 3.5 - 5.2 g/dL   AST 22  0 - 37 U/L   ALT 28  0 - 53 U/L   Alkaline Phosphatase 63  39 - 117 U/L   Total Bilirubin 0.6  0.3 - 1.2 mg/dL   GFR calc non Af Amer 42 (*) >90 mL/min   GFR calc Af Amer 48 (*) >90 mL/min   Comment:            The eGFR has been calculated     using the CKD EPI equation.     This calculation has not been     validated in all clinical     situations.     eGFR's persistently     <90 mL/min signify     possible Chronic Kidney Disease.  HEMOGLOBIN A1C     Status: Abnormal   Collection Time     09/15/12  5:20 AM      Result Value Range   Hemoglobin A1C 10.0 (*) <5.7 %   Comment: (NOTE)                                                                               According to the ADA Clinical Practice Recommendations for 2011, when     HbA1c is used as a screening test:      >=6.5%   Diagnostic of Diabetes Mellitus               (if abnormal result is confirmed)     5.7-6.4%   Increased risk of developing Diabetes Mellitus     References:Diagnosis and Classification of Diabetes Mellitus,Diabetes     [id number] 1):S62-S69 and Standards of Medical Care in             Diabetes - 2011,Diabetes Care,2011,34 (Suppl 1):S11-S61.   Mean Plasma Glucose 240 (*) <117 mg/dL  CBC WITH DIFFERENTIAL     Status: Abnormal   Collection Time    09/15/12  5:20 AM      Result Value Range  WBC 5.2  4.0 - 10.5 K/uL   RBC 4.32  4.22 - 5.81 MIL/uL   Hemoglobin 12.5 (*) 13.0 - 17.0 g/dL   HCT 35.7 (*) 39.0 - 52.0 %   MCV 82.6  78.0 - 100.0 fL   MCH 28.9  26.0 - 34.0 pg   MCHC 35.0  30.0 - 36.0 g/dL   RDW 13.3  11.5 - 15.5 %   Platelets 153  150 - 400 K/uL   Neutrophils Relative % 69  43 - 77 %   Neutro Abs 3.6  1.7 - 7.7 K/uL   Lymphocytes Relative 17  12 - 46 %   Lymphs Abs 0.9  0.7 - 4.0 K/uL   Monocytes Relative 11  3 - 12 %   Monocytes Absolute 0.6  0.1 - 1.0 K/uL   Eosinophils Relative 3  0 - 5 %   Eosinophils Absolute 0.2  0.0 - 0.7 K/uL   Basophils Relative 1  0 - 1 %   Basophils Absolute 0.0  0.0 - 0.1 K/uL  TSH     Status: None   Collection Time    09/15/12  5:20 AM      Result Value Range   TSH 2.547  0.350 - 4.500 uIU/mL  GLUCOSE, CAPILLARY     Status: Abnormal   Collection Time    09/15/12  7:23 AM      Result Value Range   Glucose-Capillary 217 (*) 70 - 99 mg/dL  URINALYSIS, ROUTINE W REFLEX MICROSCOPIC     Status: Abnormal   Collection Time    09/15/12  7:44 AM      Result Value Range   Color, Urine YELLOW  YELLOW   APPearance CLEAR  CLEAR   Specific Gravity,  Urine 1.012  1.005 - 1.030   pH 6.0  5.0 - 8.0   Glucose, UA NEGATIVE  NEGATIVE mg/dL   Hgb urine dipstick NEGATIVE  NEGATIVE   Bilirubin Urine NEGATIVE  NEGATIVE   Ketones, ur NEGATIVE  NEGATIVE mg/dL   Protein, ur 100 (*) NEGATIVE mg/dL   Urobilinogen, UA 0.2  0.0 - 1.0 mg/dL   Nitrite NEGATIVE  NEGATIVE   Leukocytes, UA NEGATIVE  NEGATIVE  URINE MICROSCOPIC-ADD ON     Status: None   Collection Time    09/15/12  7:44 AM      Result Value Range   WBC, UA 0-2  <3 WBC/hpf   RBC / HPF 0-2  <3 RBC/hpf  GLUCOSE, CAPILLARY     Status: Abnormal   Collection Time    09/15/12 11:43 AM      Result Value Range   Glucose-Capillary 304 (*) 70 - 99 mg/dL  SEDIMENTATION RATE     Status: Abnormal   Collection Time    09/15/12  3:20 PM      Result Value Range   Sed Rate 53 (*) 0 - 16 mm/hr  GLUCOSE, CAPILLARY     Status: Abnormal   Collection Time    09/15/12  4:11 PM      Result Value Range   Glucose-Capillary 199 (*) 70 - 99 mg/dL    Ct Foot Left Wo Contrast  09/15/2012   *RADIOLOGY REPORT*  Clinical Data: Cellulitis.  Left foot pain.  Left foot wound with infection.  CT OF THE LEFT FOOT WITHOUT CONTRAST  Technique:  Multidetector CT imaging was performed according to the standard protocol. Multiplanar CT image reconstructions were also generated.  Comparison: None.  Findings: There is ulceration over  the plantar aspect of the fifth metatarsal head.  High density material is present around the ulceration, probably representing Iodoform dressing.  Phlegmon and infiltration extends into the plantar subcutaneous tissues up to the plantar surface of the fifth metatarsal.  On the sagittal images, there appears to be a thin layer of fluid overlying the plantar aspect of the fifth metatarsal head suspicious for small abscess adjacent to the area of ulceration.  There is a tiny area of rarefaction of the cortex that suggests osteomyelitis associated with ulceration and infection.  Negative for Brodie  abscess.  Findings compatible with gout arthritis are present at the first MTP joint.  Hallux valgus, marginal erosions have the typical appearance for gout.  Tiny erosions are present on both sides of the sinus tarsi which may be degenerative or associated with gout arthropathy.  Mild midfoot osteoarthritis is present.  Grossly the flexor and extensor tendons appear intact.  Edema is present over the dorsum of the foot which may be secondary to cellulitis or dependent edema.  Destructive changes of the medial sesamoid of the great toe are present with heterogeneous calcifications and destruction of the dorsal aspects of the sesamoid bone.  Bone is expanded.  This is an unusual appearance but given the other findings of gout arthritis and adjacent marginal erosion in the undersurface of the metatarsal head, the findings are most compatible with gout arthropathy. Infection is considered unlikely.  This may represent intraosseous tophus formation extending from the great toe flexor tendons.  IMPRESSION: 1.  Ulceration on the plantar aspect of the fifth metatarsal head with tiny area of osteolysis in the plantar aspect of the head suggesting early osteomyelitis. 2.  Phlegmon and fluid attenuation and deep to the ulceration. Fluid attenuation suggests a thin abscess although on CT, phlegmon can have a similar appearance. 3.  Gout arthropathy, most pronounced at the great toe.   Original Report Authenticated By: Dereck Ligas, M.D.   Dg Foot Complete Left  09/14/2012   *RADIOLOGY REPORT*  Clinical Data: Nonhealing left foot ulcer.  LEFT FOOT - COMPLETE 3+ VIEW  Comparison: None.  Findings: There is soft tissue swelling at the base of the fifth toe along the outer aspect of the fifth MTP joint.  No associated fracture or bony destruction is seen.  No foreign body is identified in the soft tissues.  There are mild degenerative changes of the toes.  IMPRESSION: No overt evidence of osteomyelitis or fracture.  No soft  tissue foreign body is identified.  Soft tissue swelling is seen adjacent to the fifth toe.   Original Report Authenticated By: Aletta Edouard, M.D.    Review of Systems  Constitutional: Negative for fever, chills and diaphoresis.  Respiratory:       Past smoker  Cardiovascular:       Positive for CAD.   Genitourinary: Negative.   Musculoskeletal:       Left 5MT head ulcer extends down to capsule at least Grade 3 Wagner ulcer.   No cellulitis.  Diabetic neuropathy bilat.  No right foot calluses.   Skin: Negative for rash.  Neurological: Positive for sensory change.  Psychiatric/Behavioral: Negative for depression. The patient has insomnia. The patient is not nervous/anxious.    Blood pressure 129/85, pulse 79, temperature 98.9 F (37.2 C), temperature source Oral, resp. rate 20, height 5\' 10"  (1.778 m), weight 102.059 kg (225 lb), SpO2 95.00%. Physical Exam  Assessment/Plan: Discussed with pt that he may have bone involvement .  Long discussion on  diabetic compliance with DIET and also with CHECKING  CBG's and taking his Insulin as instructed. Discussed poor compliance and likely more surgery with loss of 5th ray and potentially foot in future if compliance is poor. Will be glad to see in Followup in a week and if with PO ABX he is not improving with better diabetic control then plan would be open MRI with valium pre-scan to rule out osteomyelitis. If present then he will require 5th ray amputation.  He runs a [employer] , travels, and doesn't want to have any foot surgery if avoidable. He understands that if the bone is indeed infected then surgery will be required. We can treat with po ABX unload with present shoe and watch.    I would be glad to see him next week in my office if he wants.    Office # [phone]  Kilea Mccarey C 09/15/2012, 7:50 PM

## 2012-09-15 NOTE — Progress Notes (Signed)
Inpatient Diabetes Program Recommendations  AACE/ADA: New Consensus Statement on Inpatient Glycemic Control (2013)  Target Ranges:  Prepandial:   less than 140 mg/dL      Peak postprandial:   less than 180 mg/dL (1-2 hours)      Critically ill patients:  140 - 180 mg/dL     Results for ANJAN, ETCHEVERRY (MRN [id number]) as of 09/15/2012 15:15  Ref. Range 09/15/2012 07:23 09/15/2012 11:43  Glucose-Capillary Latest Range: 70-99 mg/dL 217 (H) 304 (H)    Results for JADEYN, REMEDIOS (MRN [id number]) as of 09/15/2012 15:15  Ref. Range 09/15/2012 05:20  Hemoglobin A1C Latest Range: <5.7 % 10.0 (H)    Patient admitted with cellulitis and foot ulcer.  A1c 10% (09/15/12).  Upon interview, patient told me he was diagnosed with "pre-diabetes" about 3 weeks ago.  His PCP in [place] started him on Metformin 500 mg bid and patient states he started his Metformin about 2 weeks ago.  Relocated to [facility] and needs to find a PCP.  Spoke with patient about the fact that he does indeed have DM.  Spoke with pt about new diagnosis.  Discussed A1C results with him and explained what an A1C is, basic pathophysiology of DM Type 2, basic home care, importance of checking CBGs and maintaining good CBG control to prevent long-term and short-term complications.  Reviewed signs and symptoms of hyperglycemia and hypoglycemia.  Reviewed how to treat hypoglycemia at home.  Wife present for entirety of discussion.  RNs to provide ongoing basic DM education at bedside with this patient.  Have ordered educational booklet, insulin starter kit, and DM videos.  Also ordered RD consult for DM diet education and placed a referral for patient to follow up with a CDE at the [facility].  Educated patient and spouse on insulin pen use at home.  Reviewed contents of insulin flexpen starter kit.  Reviewed all steps if insulin pen including attachment of needle, 2-unit air shot, dialing up dose, giving  injection, removing needle, disposal of sharps, storage of unused insulin, disposal of insulin etc.  Patient able to provide successful return demonstration.  Also reviewed troubleshooting with insulin pen.  MD to give patient Rxs for insulin pens and insulin pen needles.  [facility] for patient.  Made appointment with Dr. Benjiman Core for patient on July 8th at 3pm (this appt placed on AVS).  Also gave patient a Rx for a blood glucose meter that was completed by Dr. Grandville Silos.   Recommend patient be discharged home on Metformin 500 mg bid + basal insulin.  Noted Dr. Grandville Silos started patient on Lantus 10 units daily today.  Patient will be followed by Dr Cruzita Lederer who can make further adjustments to patient's DM medication regimen.   **For Discharging MD- Please give patient the following Rxs at d/c (patient given a Rx for a CBG meter today) 1. Lantus solostar insulin pen 2. Insulin Pen needles 31 gauge X 36mm 3. Metformin 500 mg bid   Will follow. Wyn Quaker RN, MSN, CDE Diabetes Coordinator Inpatient Diabetes Program [phone]

## 2012-09-15 NOTE — ED Provider Notes (Signed)
Medical screening examination/treatment/procedure(s) were performed by non-physician practitioner and as supervising physician I was immediately available for consultation/collaboration.  Leota Jacobsen, MD 09/15/12 2012

## 2012-09-15 NOTE — Consult Note (Signed)
WOC consult Note Reason for Consult: evaluation of left plantar foot wound. Pt. Reports history of this wound x 6 weeks.  Has seen podiatry who placed in cam boot, which is not currently providing adequate offloading.  Wore cam boot to [place] recently to try to prevent worsening of the ulcer, however the images taken at the beginning per the family members camera in the room, it does appear it has worsened. He has had serial debridements in the podiatry office.  Wound type:neuropathic foot ulceration Measurement: 3.5cm x 3.5cm x 0.5cm  Wound bed:90% Yellow slough, with some pink tissue at the wound edges. Drainage (amount, consistency, odor) moderate serous drainage, no odor Periwound:macerated  Dressing procedure/placement/frequency: begin PT for hydrotherapy to clean away necrotic tissue, add enzymatic debridement ointment with dressing changes daily after PT.  Custom offloading to be placed by [employer] to offload the left lateral foot, since this is key to wound healing in this type of wound.   FU Appt made with [facility] for Tuesday June 17th, 10:00.  Pt and caregiver provided with appt. Information.   Re consult if needed, will not follow at this time. Thanks  [name] [place], [place] [phone])

## 2012-09-15 NOTE — Progress Notes (Signed)
[facility] PROGRESS NOTE  [name] [id number] DOB: 1950-09-23 DOA: 09/14/2012 PCP: No primary provider on file.  Assessment/Plan: #1 left foot cellulitis with probable diabetic foot ulcer Patient currently afebrile. WBC within normal limits. CT of the left foot with ulceration on the plantar aspect of the fifth metatarsal head with tiny area of ostial lysis suggesting osteomyelitis. Phlegmon and fluid attenuation deep to the ulceration suggesting a questionable thin abscess. Gouty arthropathy at the great toe. Continue empiric IV ciprofloxacin and IV vancomycin. Patient has been seen by the wound care nurse and recommend PT for hydrotherapy and biotech. Patient to followup by the wound care center as outpatient. Due to changes seen on CT scan we'll consult with orthopedics for further evaluation and management. Tight diabetes control.  #2 uncontrolled type 2 diabetes Patient states was recently diagnosed with diabetes. Patient was recently started on metformin as outpatient. Hemoglobin A1c is 10.0. CBGs have ranged from 217 to 328. We'll place on Lantus 10 units daily. Continue sliding scale insulin. Will need outpatient diabetes education. Patient requesting information concerning endocrinology.  #3 acute renal failure versus chronic kidney disease Patient with no baseline creatinine. Creatinine on admission was 1.74. Creatinine today is 1.68. Urinalysis had 100 of protein. Will check a urine sodium. Check a urine creatinine. Check a renal ultrasound. Continue gentle hydration. Follow.  #4 hypothyroidism Continue home dose Synthroid.  #5 history of coronary artery disease status post CABG Stable. Asymptomatic. Continue aspirin, benazepril, Norvasc.  #6 hypertension Increase Norvasc to 10 mg daily. Continue benazepril and titrate as needed for better blood pressure control.  #7 history of gout Stable. Continue allopurinol.  #8 prophylaxis Protonix will GI prophylaxis. Lovenox  for DVT prophylaxis.  Code Status: Full Family Communication: Updated patient and wife at bedside. Disposition Plan: Home in 1-2 days   Consultants:  Orthopedics: Dr. Lorin Mercy pending  Procedures:  CT left foot 09/15/2012  Antibiotics:  IV ciprofloxacin 09/14/2012  HPI/Subjective: Patient with no complaints. Patient asking when he could go home.  Objective: Filed Vitals:   09/14/12 2303 09/15/12 0546 09/15/12 0920 09/15/12 1310  BP: 156/91 147/74 152/82 158/72  Pulse: 80 84 83 74  Temp: 98.6 F (37 C) 98.4 F (36.9 C) 98.5 F (36.9 C) 98.9 F (37.2 C)  TempSrc: Oral Oral    Resp: 18 18 21 20   Height:      Weight:      SpO2: 100% 94% 98% 93%    Intake/Output Summary (Last 24 hours) at 09/15/12 1411 Last data filed at 09/15/12 1310  Gross per 24 hour  Intake 1356.67 ml  Output   1400 ml  Net -43.33 ml   Filed Weights   09/14/12 2019  Weight: 102.059 kg (225 lb)    Exam:   General:  NAD  Cardiovascular: RRR  Respiratory: CTAB  Abdomen: Soft/NT/ND/+BS  Extremities: Left Foot in boot.  Data Reviewed: Basic Metabolic Panel:  Recent Labs Lab 09/14/12 1855 09/15/12 0520  NA 135 136  K 3.9 3.6  CL 97 98  CO2 26 26  GLUCOSE 291* 242*  BUN 33* 28*  CREATININE 1.74* 1.68*  CALCIUM 9.3 8.8   Liver Function Tests:  Recent Labs Lab 09/15/12 0520  AST 22  ALT 28  ALKPHOS 63  BILITOT 0.6  PROT 6.2  ALBUMIN 3.2*   No results found for this basename: LIPASE, AMYLASE,  in the last 168 hours No results found for this basename: AMMONIA,  in the last 168 hours CBC:  Recent Labs  Lab 09/14/12 1855 09/15/12 0520  WBC 6.8 5.2  NEUTROABS 5.2 3.6  HGB 13.0 12.5*  HCT 37.2* 35.7*  MCV 83.8 82.6  PLT 152 153   Cardiac Enzymes: No results found for this basename: CKTOTAL, CKMB, CKMBINDEX, TROPONINI,  in the last 168 hours BNP (last 3 results) No results found for this basename: PROBNP,  in the last 8760 hours CBG:  Recent Labs Lab  09/14/12 2252 09/15/12 0723 09/15/12 1143  GLUCAP 328* 217* 304*    No results found for this or any previous visit (from the past 240 hour(s)).   Studies: Ct Foot Left Wo Contrast  09/15/2012   *RADIOLOGY REPORT*  Clinical Data: Cellulitis.  Left foot pain.  Left foot wound with infection.  CT OF THE LEFT FOOT WITHOUT CONTRAST  Technique:  Multidetector CT imaging was performed according to the standard protocol. Multiplanar CT image reconstructions were also generated.  Comparison: None.  Findings: There is ulceration over the plantar aspect of the fifth metatarsal head.  High density material is present around the ulceration, probably representing Iodoform dressing.  Phlegmon and infiltration extends into the plantar subcutaneous tissues up to the plantar surface of the fifth metatarsal.  On the sagittal images, there appears to be a thin layer of fluid overlying the plantar aspect of the fifth metatarsal head suspicious for small abscess adjacent to the area of ulceration.  There is a tiny area of rarefaction of the cortex that suggests osteomyelitis associated with ulceration and infection.  Negative for Brodie abscess.  Findings compatible with gout arthritis are present at the first MTP joint.  Hallux valgus, marginal erosions have the typical appearance for gout.  Tiny erosions are present on both sides of the sinus tarsi which may be degenerative or associated with gout arthropathy.  Mild midfoot osteoarthritis is present.  Grossly the flexor and extensor tendons appear intact.  Edema is present over the dorsum of the foot which may be secondary to cellulitis or dependent edema.  Destructive changes of the medial sesamoid of the great toe are present with heterogeneous calcifications and destruction of the dorsal aspects of the sesamoid bone.  Bone is expanded.  This is an unusual appearance but given the other findings of gout arthritis and adjacent marginal erosion in the undersurface of the  metatarsal head, the findings are most compatible with gout arthropathy. Infection is considered unlikely.  This may represent intraosseous tophus formation extending from the great toe flexor tendons.  IMPRESSION: 1.  Ulceration on the plantar aspect of the fifth metatarsal head with tiny area of osteolysis in the plantar aspect of the head suggesting early osteomyelitis. 2.  Phlegmon and fluid attenuation and deep to the ulceration. Fluid attenuation suggests a thin abscess although on CT, phlegmon can have a similar appearance. 3.  Gout arthropathy, most pronounced at the great toe.   Original Report Authenticated By: Dereck Ligas, M.D.   Dg Foot Complete Left  09/14/2012   *RADIOLOGY REPORT*  Clinical Data: Nonhealing left foot ulcer.  LEFT FOOT - COMPLETE 3+ VIEW  Comparison: None.  Findings: There is soft tissue swelling at the base of the fifth toe along the outer aspect of the fifth MTP joint.  No associated fracture or bony destruction is seen.  No foreign body is identified in the soft tissues.  There are mild degenerative changes of the toes.  IMPRESSION: No overt evidence of osteomyelitis or fracture.  No soft tissue foreign body is identified.  Soft tissue swelling is seen  adjacent to the fifth toe.   Original Report Authenticated By: Aletta Edouard, M.D.    Scheduled Meds: . allopurinol  300 mg Oral Daily  . amLODipine  5 mg Oral Daily  . aspirin  81 mg Oral Daily  . atorvastatin  20 mg Oral q1800  . benazepril  10 mg Oral Daily  . ciprofloxacin  400 mg Intravenous Q12H  . collagenase   Topical Daily  . gabapentin  1,200 mg Oral BID  . insulin aspart  0-9 Units Subcutaneous TID WC  . insulin glargine  10 Units Subcutaneous Daily  . levothyroxine  75 mcg Oral QAC breakfast  . pantoprazole  40 mg Oral Q1200  . vancomycin  1,500 mg Intravenous Q24H   Continuous Infusions: . sodium chloride 50 mL/hr at 09/15/12 1310    Principal Problem:   Cellulitis Active Problems:    Hypercholesteremia   Neuropathy   Diabetic foot ulcer   Gout   HTN (hypertension)   Hyperlipidemia   Hypothyroidism   CAD (coronary artery disease)   Diabetes mellitus    Time spent: > 35 mins    [facility] Pager [phone]. If 7PM-7AM, please contact night-coverage at [email or web address], password [facility] 09/15/2012, 2:11 PM  LOS: 1 day

## 2012-09-15 NOTE — Progress Notes (Signed)
Physical Therapy Wound Treatment Patient Details  Name: Ulyses Trick MRN: [id number] Date of Birth: 15-Jun-1950  Today's Date: 09/15/2012 Time: [id number] Time Calculation (min): 41 min  Subjective  Subjective: Yeah, I've got to get out of here. Patient and Family Stated Goals: Healed up,  We have a trip to [place]. Date of Onset:  (chonic)  Pain Score: Pain Score:   2  Wound Assessment  Wound 09/14/12 Diabetic ulcer Foot Right;Lower 2.5x2cm (Active)  Site / Wound Assessment Red;Yellow 09/15/2012  2:54 PM  % Wound base Red or Granulating 40% 09/15/2012  2:54 PM  % Wound base Yellow 60% 09/15/2012  2:54 PM  Peri-wound Assessment Erythema (blanchable);Other (Comment) 09/15/2012  2:54 PM  Wound Length (cm) 2.2 cm 09/15/2012  2:54 PM  Wound Width (cm) 2.3 cm 09/15/2012  2:54 PM  Wound Depth (cm) 0.9 cm 09/15/2012  2:54 PM  Margins Unattacted edges (unapproximated) 09/15/2012  2:54 PM  Drainage Amount Minimal 09/15/2012  2:54 PM  Drainage Description Serosanguineous 09/15/2012  2:54 PM  Treatment Cleansed;Debridement (Selective);Packing (Saline gauze);Hydrotherapy (Pulse lavage) 09/15/2012  2:54 PM  Dressing Type Moist to dry 09/15/2012  2:54 PM  Dressing Changed New 09/15/2012  2:54 PM  Dressing Status Clean;Dry;Intact 09/15/2012  2:54 PM   Hydrotherapy Pulsed lavage therapy - wound location: to Left forefoot diabetic ulcer Pulsed Lavage with Suction (psi): 4 psi (to 12 psi) Pulsed Lavage with Suction - Normal Saline Used: 1000 mL Pulsed Lavage Tip: Tip with splash shield Selective Debridement Selective Debridement - Location: Left forefoot ulcer Selective Debridement - Tools Used: Forceps;Scissors Selective Debridement - Tissue Removed: yellow necrosis   Wound Assessment and Plan  Wound Therapy - Assess/Plan/Recommendations Wound Therapy - Clinical Statement: This is a progressing chronic diabetic ulcer that should benefit from [facility] and selective debridement. Wound Therapy - Functional Problem  List: active Factors Delaying/Impairing Wound Healing: Diabetes Mellitus Hydrotherapy Plan: Debridement;Dressing change;Pulsatile lavage with suction;Patient/family education Wound Therapy - Frequency: 6X / week Wound Therapy - Follow Up Recommendations: Home health RN Wound Plan: debride down to healthy tissue to allow for granulation and epithelialization  Wound Therapy Goals- Improve the function of patient's integumentary system by progressing the wound(s) through the phases of wound healing (inflammation - proliferation - remodeling) by: Decrease Necrotic Tissue to: 0% Decrease Necrotic Tissue - Progress: Goal set today Increase Granulation Tissue to: 100% Increase Granulation Tissue - Progress: Goal set today Improve Drainage Characteristics: Min;Serous Improve Drainage Characteristics - Progress: Goal set today Patient/Family will be able to : do basic dressing change Patient/Family Instruction Goal - Progress: Goal set today Time For Goal Achievement: 2 weeks Wound Therapy - Potential for Goals: Good  Goals will be updated until maximal potential achieved or discharge criteria met.  Discharge criteria: when goals achieved, discharge from hospital, MD decision/surgical intervention, no progress towards goals, refusal/missing three consecutive treatments without notification or medical reason.  GP     Adair Lemar, Tessie Fass 09/15/2012, 3:02 PM 09/15/2012  Donnella Sham, [place] [phone]  (pager)

## 2012-09-15 NOTE — Progress Notes (Signed)
As per diabetes coordinator instruction,the RN wanted to show pt. Video 511 about diabetes,pt. Refuses because he said he knows everything about diabetes.Pt. Agree to practice with the syringe and gave himself the Lantus.keep monitoring pt. Closely and assessing his needs.

## 2012-09-16 ENCOUNTER — Inpatient Hospital Stay (HOSPITAL_COMMUNITY): Payer: Managed Care, Other (non HMO)

## 2012-09-16 DIAGNOSIS — G589 Mononeuropathy, unspecified: Secondary | ICD-10-CM

## 2012-09-16 LAB — CBC
HCT: 36.9 % — ABNORMAL LOW (ref 39.0–52.0)
Hemoglobin: 12.7 g/dL — ABNORMAL LOW (ref 13.0–17.0)
MCH: 28.7 pg (ref 26.0–34.0)
MCV: 83.3 fL (ref 78.0–100.0)
RBC: 4.43 MIL/uL (ref 4.22–5.81)

## 2012-09-16 LAB — BASIC METABOLIC PANEL
CO2: 26 mEq/L (ref 19–32)
Glucose, Bld: 268 mg/dL — ABNORMAL HIGH (ref 70–99)
Potassium: 4.1 mEq/L (ref 3.5–5.1)
Sodium: 138 mEq/L (ref 135–145)

## 2012-09-16 MED ORDER — GABAPENTIN 600 MG PO TABS: 1200.0000 mg | ORAL_TABLET | Freq: Two times a day (BID) | ORAL | Status: DC

## 2012-09-16 MED ORDER — SULFAMETHOXAZOLE-TRIMETHOPRIM 800-160 MG PO TABS: 1.0000 | ORAL_TABLET | Freq: Two times a day (BID) | ORAL | Status: DC

## 2012-09-16 MED ORDER — INSULIN PEN NEEDLE 31G X 5 MM MISC: 1.0000 | Freq: Every day | Status: DC

## 2012-09-16 MED ORDER — COLLAGENASE 250 UNIT/GM EX OINT: TOPICAL_OINTMENT | Freq: Every day | CUTANEOUS | Status: DC

## 2012-09-16 MED ORDER — INSULIN GLARGINE 100 UNITS/ML SOLOSTAR PEN: 15.0000 [IU] | PEN_INJECTOR | Freq: Every day | SUBCUTANEOUS | Status: DC

## 2012-09-16 NOTE — Progress Notes (Signed)
Pt signed discharge papers, prescriptions given. Pt given dressing change supplies for initial home change, and instructions on santyl application covered with a NS moist to dry gauze to be changed every day. Instructed to keep foot clean and dry, and to call for worsening redness, fever, signs of infection. IV removed. Volunteer services notified.

## 2012-09-16 NOTE — Discharge Summary (Signed)
Physician Discharge Summary  [name] [id number] DOB: 07/11/1950 DOA: 09/14/2012  PCP: No primary provider on file.  Admit date: 09/14/2012 Discharge date: 09/16/2012  Time spent: greater than 30 minutes  Recommendations for Outpatient Follow-up:    Discharge Diagnoses:    Diabetic foot ulcer with Cellulitis, left lateral fifth metarsal head, possible osteomyelysis    Hypercholesteremia    Neuropathy    Gout    HTN (hypertension)    Hyperlipidemia    Hypothyroidism    CAD (coronary artery disease)    Diabetes mellitus, type 2, uncontrolled  Discharge Condition: stable  Filed Weights   09/14/12 2019 09/15/12 2102  Weight: 102.059 kg (225 lb) 102.059 kg (225 lb)    History of present illness:  [name] is a 62 y.o. male with history of hypertension, diabetes mellitus, CAD status post CABG, hyperlipidemia and hypothyroidism has been noticing some redness and ulceration on his leg 6 weeks ago. He had gone to podiatrist in the ER and was instructed to decrease weight on the leg. Eventually patient started developing some pain and ulceration but worse had gone to another podiatrist and at this time was given diabetic foot wear. Patient recently went to [place] and back when he started developing further worsening of the ulcer with active discharge and erythema involving his foot. He had gone to a podiatrist today and had gone debridement was instructed to to the ER. X-rays don't show anything acute patient has been admitted for cellulitis with diabetic foot ulcer. Patient denies any fever chills chest pain shortness of breath.  Hospital Course:   #1 left foot cellulitis with probable diabetic foot ulcer  Patient currently afebrile. WBC within normal limits. Refused MRI due to claustrophobia.  CT of the left foot with ulceration on the plantar aspect of the fifth metatarsal head with tiny area of ostial lysis suggesting osteomyelitis. Phlegmon and fluid attenuation deep to the  ulceration suggesting a questionable thin abscess. Gouty arthropathy at the great toe.  IV ciprofloxacin and IV vancomycin. Patient has been seen by the wound care nurse and recommend PT for hydrotherapy and biotech. Patient to followup by the wound care center as outpatient. Due to changes seen on CT scan Dr. Lorin Mercy consulted.  He will f/u in a week and consider open MRI.  If osteomyelitis, will require 5th ray amputation  #2 uncontrolled type 2 diabetes  Patient states was recently diagnosed with diabetes. Patient was recently started on metformin as outpatient. Hemoglobin A1c is 10.0. Discharge on insulin as below  #3 acute renal failure versus chronic kidney disease  Patient with no baseline creatinine on record. Creatinine on admission was 1.74.  Improved slightly with hydration  #4 hypothyroidism  Continue home dose Synthroid.   #5 history of coronary artery disease status post CABG  Stable. Asymptomatic. Continue aspirin, benazepril, Norvasc.   Procedures:  none  Consultations:  Ortho  WOC  Discharge Exam: Filed Vitals:   09/15/12 1310 09/15/12 1616 09/15/12 2102 09/16/12 0514  BP: 158/72 129/85 146/74 164/88  Pulse: 74 79 65 72  Temp: 98.9 F (37.2 C)  98 F (36.7 C) 98.4 F (36.9 C)  TempSrc:   Oral Oral  Resp: 20 20 20 20   Height:   5\' 10"  (1.778 m)   Weight:   102.059 kg (225 lb)   SpO2: 93% 95% 100% 100%   Foot with ulcer with minimal erythema  Discharge Instructions  Discharge Orders   Future Appointments Provider Department Dept Phone   10/13/2012 3:00  PM Philemon Kingdom, MD [facility] ENDOCRINOLOGY [phone]   Future Orders Complete By Expires     Activity as tolerated - No restrictions  As directed     Scheduling Instructions:      Wear offloading boot when ambulating    Ambulatory referral to Nutrition and Diabetic Education  As directed     Comments:      Patient with newly diagnosed diabetes.  A1c 10% (09/15/12).  To be d/c'd on  Metformin and Lantus.  Has appointment with Dr. Benjiman Core in July for Endocrinology.    Diet - low sodium heart healthy  As directed     Diet Carb Modified  As directed     Discharge instructions  As directed     Comments:      Monitor blood glucose at least once daily    Discharge wound care:  As directed     Comments:      Apply santyl ointment to foot wound and cover with gauze dressing daily        Medication List    TAKE these medications       allopurinol 300 MG tablet  Commonly known as:  ZYLOPRIM  Take 300 mg by mouth daily.     amLODipine-benazepril 5-10 MG per capsule  Commonly known as:  LOTREL  Take 1 capsule by mouth daily.     aspirin 81 MG chewable tablet  Chew 81 mg by mouth daily.     celecoxib 100 MG capsule  Commonly known as:  CELEBREX  Take 100 mg by mouth daily.     collagenase ointment  Commonly known as:  SANTYL  Apply topically daily. To foot wound     esomeprazole 40 MG capsule  Commonly known as:  NEXIUM  Take 40 mg by mouth daily before breakfast.     gabapentin 600 MG tablet  Commonly known as:  NEURONTIN  Take 2 tablets (1,200 mg total) by mouth 2 (two) times daily.     hydrochlorothiazide 25 MG tablet  Commonly known as:  HYDRODIURIL  Take 25 mg by mouth daily.     insulin glargine 100 units/mL Soln  Commonly known as:  LANTUS  Inject 15 Units into the skin at bedtime.     Insulin Pen Needle 31G X 5 MM Misc  Commonly known as:  FIFTY50 PEN NEEDLES  1 each by Does not apply route at bedtime.     levothyroxine 75 MCG tablet  Commonly known as:  SYNTHROID, LEVOTHROID  Take 75 mcg by mouth daily before breakfast.     LIVALO 4 MG Tabs  Generic drug:  Pitavastatin Calcium  Take 4 mg by mouth 2 (two) times a week.     metFORMIN 500 MG tablet  Commonly known as:  GLUCOPHAGE  Take 500 mg by mouth 2 (two) times daily with a meal.     sulfamethoxazole-trimethoprim 800-160 MG per tablet  Commonly known as:  BACTRIM DS   Take 1 tablet by mouth 2 (two) times daily.     tadalafil 20 MG tablet  Commonly known as:  CIALIS  Take 20 mg by mouth daily as needed for erectile dysfunction.       Allergies  Allergen Reactions  . Mushroom Extract Complex Nausea Only       Follow-up Information   Follow up with Marybelle Killings, MD In 1 week. (to check wound)    Contact information:   [place] [address] [phone]  Follow up with [facility] center On 09/22/2012. (at 10 am)       Follow up with primary care provider of choice In 2 weeks.       The results of significant diagnostics from this hospitalization (including imaging, microbiology, ancillary and laboratory) are listed below for reference.    Significant Diagnostic Studies: US Renal  09/16/2012   *RADIOLOGY REPORT*  Clinical Data: Elevated creatinine.  Diabetes.  RENAL/URINARY TRACT ULTRASOUND COMPLETE  Comparison:  None.  Findings:  Right Kidney:  11.7 cm. Normal echotexture.  Normal central sinus echo complex.  No calculi or hydronephrosis.  Left Kidney:  11.5 cm. Normal echotexture.  Normal central sinus echo complex.  No calculi or hydronephrosis.  Bladder:  Urinary bladder appears normal.  Prostatomegaly is present with the prostate measuring 48 mm x 35 mm x 41 mm.  IMPRESSION:  1.  Normal appearance of the kidneys and urinary bladder. 2.  Prostatomegaly.   Original Report Authenticated By: Dereck Ligas, M.D.   Ct Foot Left Wo Contrast  09/15/2012   *RADIOLOGY REPORT*  Clinical Data: Cellulitis.  Left foot pain.  Left foot wound with infection.  CT OF THE LEFT FOOT WITHOUT CONTRAST  Technique:  Multidetector CT imaging was performed according to the standard protocol. Multiplanar CT image reconstructions were also generated.  Comparison: None.  Findings: There is ulceration over the plantar aspect of the fifth metatarsal head.  High density material is present around the ulceration, probably representing  Iodoform dressing.  Phlegmon and infiltration extends into the plantar subcutaneous tissues up to the plantar surface of the fifth metatarsal.  On the sagittal images, there appears to be a thin layer of fluid overlying the plantar aspect of the fifth metatarsal head suspicious for small abscess adjacent to the area of ulceration.  There is a tiny area of rarefaction of the cortex that suggests osteomyelitis associated with ulceration and infection.  Negative for Brodie abscess.  Findings compatible with gout arthritis are present at the first MTP joint.  Hallux valgus, marginal erosions have the typical appearance for gout.  Tiny erosions are present on both sides of the sinus tarsi which may be degenerative or associated with gout arthropathy.  Mild midfoot osteoarthritis is present.  Grossly the flexor and extensor tendons appear intact.  Edema is present over the dorsum of the foot which may be secondary to cellulitis or dependent edema.  Destructive changes of the medial sesamoid of the great toe are present with heterogeneous calcifications and destruction of the dorsal aspects of the sesamoid bone.  Bone is expanded.  This is an unusual appearance but given the other findings of gout arthritis and adjacent marginal erosion in the undersurface of the metatarsal head, the findings are most compatible with gout arthropathy. Infection is considered unlikely.  This may represent intraosseous tophus formation extending from the great toe flexor tendons.  IMPRESSION: 1.  Ulceration on the plantar aspect of the fifth metatarsal head with tiny area of osteolysis in the plantar aspect of the head suggesting early osteomyelitis. 2.  Phlegmon and fluid attenuation and deep to the ulceration. Fluid attenuation suggests a thin abscess although on CT, phlegmon can have a similar appearance. 3.  Gout arthropathy, most pronounced at the great toe.   Original Report Authenticated By: Dereck Ligas, M.D.   Dg Foot Complete  Left  09/14/2012   *RADIOLOGY REPORT*  Clinical Data: Nonhealing left foot ulcer.  LEFT FOOT - COMPLETE 3+ VIEW  Comparison: None.  Findings: There is soft tissue swelling at the base of the fifth toe along the outer aspect of the fifth MTP joint.  No associated fracture or bony destruction is seen.  No foreign body is identified in the soft tissues.  There are mild degenerative changes of the toes.  IMPRESSION: No overt evidence of osteomyelitis or fracture.  No soft tissue foreign body is identified.  Soft tissue swelling is seen adjacent to the fifth toe.   Original Report Authenticated By: Aletta Edouard, M.D.    Microbiology: No results found for this or any previous visit (from the past 240 hour(s)).   Labs: Basic Metabolic Panel:  Recent Labs Lab 09/14/12 1855 09/15/12 0520 09/16/12 0525  NA 135 136 138  K 3.9 3.6 4.1  CL 97 98 101  CO2 26 26 26   GLUCOSE 291* 242* 268*  BUN 33* 28* 25*  CREATININE 1.74* 1.68* 1.60*  CALCIUM 9.3 8.8 9.2   Liver Function Tests:  Recent Labs Lab 09/15/12 0520  AST 22  ALT 28  ALKPHOS 63  BILITOT 0.6  PROT 6.2  ALBUMIN 3.2*   No results found for this basename: LIPASE, AMYLASE,  in the last 168 hours No results found for this basename: AMMONIA,  in the last 168 hours CBC:  Recent Labs Lab 09/14/12 1855 09/15/12 0520 09/16/12 0525  WBC 6.8 5.2 5.3  NEUTROABS 5.2 3.6  --   HGB 13.0 12.5* 12.7*  HCT 37.2* 35.7* 36.9*  MCV 83.8 82.6 83.3  PLT 152 153 168  CBG:  Recent Labs Lab 09/15/12 0723 09/15/12 1143 09/15/12 1611 09/15/12 2106 09/16/12 0805  GLUCAP 217* 304* 199* 319* 247*   Signed:  Jaser Fullen L  [facility] 09/16/2012, 10:13 AM

## 2012-09-16 NOTE — Progress Notes (Signed)
Physical Therapy Wound Treatment Patient Details  Name: [name] MRN: [id number] Date of Birth: [birth year]  Today's Date: 09/16/2012 Time: [id number] Time Calculation (min): 38 min  Subjective  Subjective: I am leaving today, I'll be going to the wound center. Patient and Family Stated Goals: Healed up,  We have a trip to [place]. Date of Onset:  (chonic)  Pain Score:    Wound Assessment  Wound 09/14/12 Diabetic ulcer Foot Right;Lower 2.5x2cm (Active)  Site / Wound Assessment Red;Yellow 09/16/2012 12:29 PM  % Wound base Red or Granulating 50% 09/16/2012 12:29 PM  % Wound base Yellow 50% 09/16/2012 12:29 PM  Peri-wound Assessment Erythema (blanchable);Other (Comment) 09/16/2012 12:29 PM  Wound Length (cm) 2.2 cm 09/15/2012  2:54 PM  Wound Width (cm) 2.3 cm 09/15/2012  2:54 PM  Wound Depth (cm) 0.9 cm 09/15/2012  2:54 PM  Margins Unattacted edges (unapproximated) 09/16/2012 12:29 PM  Closure None 09/16/2012  8:18 AM  Drainage Amount Minimal 09/16/2012 12:29 PM  Drainage Description Serosanguineous;Sanguineous 09/16/2012 12:29 PM  Treatment Cleansed 09/16/2012  8:18 AM  Dressing Type Moist to dry 09/16/2012 12:29 PM  Dressing Changed Changed 09/16/2012 12:29 PM  Dressing Status Clean;Dry;Intact 09/16/2012 12:29 PM   Hydrotherapy Pulsed lavage therapy - wound location: to Left forefoot diabetic ulcer Pulsed Lavage with Suction (psi): 4 psi (to 12 psi) Pulsed Lavage with Suction - Normal Saline Used: 1000 mL Pulsed Lavage Tip: Tip with splash shield Selective Debridement Selective Debridement - Location: Left forefoot ulcer Selective Debridement - Tools Used: Forceps;Scissors Selective Debridement - Tissue Removed: yellow necrosis   Wound Assessment and Plan  Wound Therapy - Assess/Plan/Recommendations Wound Therapy - Clinical Statement: This is a progressing chronic diabetic ulcer that should benefit from [facility] and selective debridement. Wound Therapy - Functional Problem List: active Factors  Delaying/Impairing Wound Healing: Diabetes Mellitus Hydrotherapy Plan: Debridement;Dressing change;Pulsatile lavage with suction;Patient/family education Wound Therapy - Frequency: 6X / week Wound Therapy - Follow Up Recommendations: Home health RN Wound Plan: debride down to healthy tissue to allow for granulation and epithelialization  Wound Therapy Goals- Improve the function of patient's integumentary system by progressing the wound(s) through the phases of wound healing (inflammation - proliferation - remodeling) by: Decrease Necrotic Tissue to: 0% Decrease Necrotic Tissue - Progress: Progressing toward goal Increase Granulation Tissue to: 100% Increase Granulation Tissue - Progress: Progressing toward goal Improve Drainage Characteristics: Min;Serous Improve Drainage Characteristics - Progress: Progressing toward goal Patient/Family will be able to : do basic dressing change Time For Goal Achievement: 2 weeks Wound Therapy - Potential for Goals: Good  Goals will be updated until maximal potential achieved or discharge criteria met.  Discharge criteria: when goals achieved, discharge from hospital, MD decision/surgical intervention, no progress towards goals, refusal/missing three consecutive treatments without notification or medical reason.  GP     Diaz Crago, Tessie Fass 09/16/2012, 12:32 PM 09/16/2012  Donnella Sham, [place] [phone]  (pager)

## 2012-09-16 NOTE — Progress Notes (Signed)
Inpatient Diabetes Program Recommendations  AACE/ADA: New Consensus Statement on Inpatient Glycemic Control (2013)  Target Ranges:  Prepandial:   less than 140 mg/dL      Peak postprandial:   less than 180 mg/dL (1-2 hours)      Critically ill patients:  140 - 180 mg/dL     Results for [name], [name] (MRN [id number]) as of 09/16/2012 09:35  Ref. Range 09/15/2012 07:23 09/15/2012 11:43 09/15/2012 16:11 09/15/2012 21:06  Glucose-Capillary Latest Range: 70-99 mg/dL 217 (H) 304 (H) 199 (H) 319 (H)    Results for [name], [name] (MRN [id number]) as of 09/16/2012 09:35  Ref. Range 09/16/2012 08:05  Glucose-Capillary Latest Range: 70-99 mg/dL 247 (H)    Noted Lantus 10 units started yesterday.  Patient with fasting hyperglycemia again today.  **Spoke with Dr. Grandville Silos yesterday.  Dr. Grandville Silos would like to send patient home on insulin along with Metformin.  Patient already has an appointment with Dr. Benjiman Core with [facility] on July 8th.  Please see DM Coordinator note from 06/10 to review the discussion this DM Coordinator had with patient on 06/10.  Patient would prefer Lantus solostar pen and has been instructed on how to use the insulin pen for home.  **Recommend the following Lantus adjustment:    Please increase Lantus to 20 units daily (0.2 units/kg dosing- patient weight 102 kg)      Please make sure to give patient Rxs for the following: 1. Lantus solostar insulin pen 2. Insulin Pen needles 31 gauge X 31mm 3. Metformin 500 mg bid   Will follow. Wyn Quaker RN, MSN, CDE Diabetes Coordinator Inpatient Diabetes Program [phone]

## 2012-09-16 NOTE — Progress Notes (Signed)
Attempted to administer pt synthroid, pt refused. States he is hungry and not willing to wait to eat. States he has not had his synthroid in 3 days due to similar circumstances. Requesting med admin time changed to 6 am. Will continue to monitor.

## 2012-09-17 NOTE — ED Provider Notes (Signed)
Medical screening examination/treatment/procedure(s) were conducted as a shared visit with non-physician practitioner(s) and myself.  I personally evaluated the patient during the encounter  Leota Jacobsen, MD 09/17/12 1317

## 2012-09-21 LAB — CULTURE, BLOOD (ROUTINE X 2): Culture: NO GROWTH

## 2012-09-22 ENCOUNTER — Encounter (HOSPITAL_BASED_OUTPATIENT_CLINIC_OR_DEPARTMENT_OTHER): Payer: Managed Care, Other (non HMO) | Attending: General Surgery

## 2012-09-22 DIAGNOSIS — L97509 Non-pressure chronic ulcer of other part of unspecified foot with unspecified severity: Secondary | ICD-10-CM | POA: Insufficient documentation

## 2012-09-22 DIAGNOSIS — E039 Hypothyroidism, unspecified: Secondary | ICD-10-CM | POA: Insufficient documentation

## 2012-09-22 DIAGNOSIS — Z951 Presence of aortocoronary bypass graft: Secondary | ICD-10-CM | POA: Insufficient documentation

## 2012-09-22 DIAGNOSIS — Z7982 Long term (current) use of aspirin: Secondary | ICD-10-CM | POA: Insufficient documentation

## 2012-09-22 DIAGNOSIS — Z794 Long term (current) use of insulin: Secondary | ICD-10-CM | POA: Insufficient documentation

## 2012-09-22 DIAGNOSIS — E1169 Type 2 diabetes mellitus with other specified complication: Secondary | ICD-10-CM | POA: Insufficient documentation

## 2012-09-22 DIAGNOSIS — Z79899 Other long term (current) drug therapy: Secondary | ICD-10-CM | POA: Insufficient documentation

## 2012-09-22 DIAGNOSIS — M109 Gout, unspecified: Secondary | ICD-10-CM | POA: Insufficient documentation

## 2012-09-22 DIAGNOSIS — I10 Essential (primary) hypertension: Secondary | ICD-10-CM | POA: Insufficient documentation

## 2012-09-22 DIAGNOSIS — G589 Mononeuropathy, unspecified: Secondary | ICD-10-CM | POA: Insufficient documentation

## 2012-09-22 DIAGNOSIS — B958 Unspecified staphylococcus as the cause of diseases classified elsewhere: Secondary | ICD-10-CM | POA: Insufficient documentation

## 2012-09-23 NOTE — Progress Notes (Signed)
[facility]  NAME:  Jon, [name] NO.:  [id number]  MEDICAL RECORD NO.:  [id number]      DATE OF BIRTH:  11-24-1950  PHYSICIAN:  Judene Companion, M.D.           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  He is a diabetic and he is on insulin and Glucophage.  He comes here today with a diabetic foot ulcer on the lateral aspect of his left foot that is 2 cm in diameter.  He has a blood pressure of 120/70, respirations 18, temperature 98, he weighs 226 pounds.  He has been a diabetic for 20+ years, and has handled his sugars very well especially since the start of using Lantus insulin.  He also has hypertension and neuropathy.  He has had coronary artery bypass graft.  He also takes aspirin, amlodipine, allopurinol for gout, Celebrex, Nexium, hydrochlorothiazide 25 mg a day, and levothyroxine 75 mcg a day.  He has also been on Bactrim because they cultured out staphylococci on his ulcer of his foot at his doctor's office, and we are going to continue him on that.  On examination, he has good pulses. His feet are warm.  He has good hair distribution.  On the lateral aspect of his left foot, is a clean, well-circumscribed 2 cm diabetic foot ulcer.  We discussed several sorts of treatment, one being hyperbaric oxygen which he said would be very difficult for him to go to because of his obligations at his business.  We talked about an EZCast and he felt like he could do this.  I think we will also apply for an Apligraf, and we will plan on an EZCast, hopefully we can get these accomplished next week.  In the meantime, today we put collagen on there and wrapped the foot in a dressing and he has an offloading shoe, so he will be back next week.  DIAGNOSES:  Diabetic foot ulcer, left foot, type 2 diabetes, hypertension, hypothyroidism, and  gout.     Judene Companion, M.D.     PP/MEDQ  D:  09/22/2012  T:  09/23/2012  Job:  [id number]

## 2012-10-12 ENCOUNTER — Encounter (HOSPITAL_BASED_OUTPATIENT_CLINIC_OR_DEPARTMENT_OTHER): Payer: Managed Care, Other (non HMO) | Attending: General Surgery

## 2012-10-12 DIAGNOSIS — E1169 Type 2 diabetes mellitus with other specified complication: Secondary | ICD-10-CM | POA: Insufficient documentation

## 2012-10-12 DIAGNOSIS — L97509 Non-pressure chronic ulcer of other part of unspecified foot with unspecified severity: Secondary | ICD-10-CM | POA: Insufficient documentation

## 2012-10-13 ENCOUNTER — Ambulatory Visit (INDEPENDENT_AMBULATORY_CARE_PROVIDER_SITE_OTHER): Payer: Managed Care, Other (non HMO) | Admitting: Internal Medicine

## 2012-10-13 ENCOUNTER — Encounter: Payer: Self-pay | Admitting: Internal Medicine

## 2012-10-13 VITALS — BP 124/68 | HR 77 | Temp 98.0°F | Resp 10 | Ht 71.0 in | Wt 233.0 lb

## 2012-10-13 DIAGNOSIS — E119 Type 2 diabetes mellitus without complications: Secondary | ICD-10-CM

## 2012-10-13 MED ORDER — METFORMIN HCL 500 MG PO TABS: 1000.0000 mg | ORAL_TABLET | Freq: Two times a day (BID) | ORAL | Status: DC

## 2012-10-13 NOTE — Progress Notes (Signed)
Patient ID: [name], male   DOB: 02-24-1951, 62 y.o.   MRN: [id number]  HPI: [name] is a 62 y.o.-year-old male, referred by the hospital diabetes educator for management of newly diagnosed DM2, insulin-dependent, uncontrolled, with complications (peripheral neuropathy, CAD - s/p CABG, ?CKD, ED, diabetic ulcer). He recently moved to [place] from [facility]. Last visit with his primary care doctor was in 07/2012, at that time, his sugars were found to be 300. He was advised to establish care with a physician here in [place]. He did not have time to do this yet.  Pt has been formally diagnosed with diabetes in 09/2012 when he was hospitalized for diabetic foot ulcer with left foot cellulitis (sent to the ED by podiatrist). He also had ?early osteomyelitis in 5th L metatarsal. He was treated with iv ABx, d'c'd on Bactrim, however, at the wound center yesterday: greenish aspect and sweet smell >> concern for pseudomonas >> changed to Cipro. He was discharged on Metformin and Lantus 1 mo ago, he continues both today.   At the last hospitalization, a hemoglobin A1c was: Lab Results  Component Value Date   HGBA1C 10.0* 09/15/2012  Sugars in the hospital have been 199-328.   Last CMP:   Chemistry      Component Value Date/Time   NA 138 09/16/2012 0525   K 4.1 09/16/2012 0525   CL 101 09/16/2012 0525   CO2 26 09/16/2012 0525   BUN 25* 09/16/2012 0525   CREATININE 1.60* 09/16/2012 0525      Component Value Date/Time   CALCIUM 9.2 09/16/2012 0525   ALKPHOS 63 09/15/2012 0520   AST 22 09/15/2012 0520   ALT 28 09/15/2012 0520   BILITOT 0.6 09/15/2012 0520  Glu 258   Pt is on a regimen of: - Metformin 500 mg po bid - he is tolerating this well - Lantus 10 units qhs  Pt checks his sugars once a day and they are: - am: 125-145 Does not check sugars later in the day as he is very busy at work. No lows. Lowest sugar was 119; ? If has hypoglycemia awareness. Highest sugar was 145 after the  hospitalization.  Pt's meals are: - Breakfast: 2 slices of whole wheat + cream cheese or muenster cheese + veggies - Lunch: sandwich sometimes with meat, veggies - Dinner: out usually; chicken, sometimes fried meal, whole wheat pasta, sushi - Snacks: cottage cheese, greek yoghurt; diet jello  He lifts weights 3 times a week for exercise.  Pt has kidney disease, but unclear if acute or chronic, last BUN/creatinine was 25/1.6 a mo ago.  Lab Results  Component Value Date   BUN 25* 09/16/2012   CREATININE 1.60* 09/16/2012  On Benazepril.  Pt's last eye exam was 5 mo ago. No DR. Denies numbness and tingling in his legs in Neurontin. Left leg worse. Sees Podiatry.   I reviewed his chart and he also has a history of gout-on Allopurinol, also has gout arthropathy, HTN, HL-on Pitavastatin, hypothyroidism-on levothyroxine 75, last TSH 2.54; GERD. He tells me that he was recently found to have an enlarged prostate on the CT scan obtained in the hospital. Previous PSA levels have been low.  Pt has FH of DM in both parents and 2 brothers.  ROS: Constitutional: no weight gain/loss, no fatigue, no subjective hyperthermia/hypothermia Eyes: no blurry vision, no xerophthalmia ENT: no sore throat, no nodules palpated in throat, no dysphagia/odynophagia, no hoarseness Cardiovascular: no CP/SOB/palpitations/leg swelling Respiratory: no cough/SOB Gastrointestinal: no N/V/D/C Musculoskeletal:  no muscle/joint aches Skin: no rashes Neurological: no tremors/numbness/tingling/dizziness Psychiatric: no depression/anxiety  Past Medical History  Diagnosis Date  . Coronary artery disease   . Hypertension   . Hypercholesteremia   . Neuropathy   . Diabetes mellitus without complication    Past Surgical History  Procedure Laterality Date  . Coronary artery bypass graft    . Cholecystectomy    . Back surgery     History   Social History  . Marital Status: Married    Spouse Name: N/A    Number of  Children: 63 and 18 y/o  . Years of Education: N/A   Occupational History  . Executive   Social History Main Topics  . Smoking status: Former Research scientist (life sciences), quit in 1991  . Smokeless tobacco: Not on file  . Alcohol Use: No  . Drug Use: No  . Sexually Active: Yes -- Male partner(s)   Social History Narrative   Regular exercise: yes 3 times a week - lifts weights   Caffeine use: hot tea   Current Outpatient Prescriptions on File Prior to Visit  Medication Sig Dispense Refill  . allopurinol (ZYLOPRIM) 300 MG tablet Take 300 mg by mouth daily.      Marland Kitchen amLODipine-benazepril (LOTREL) 5-10 MG per capsule Take 1 capsule by mouth daily.      Marland Kitchen aspirin 81 MG chewable tablet Chew 81 mg by mouth daily.      . celecoxib (CELEBREX) 100 MG capsule Take 100 mg by mouth daily.      . collagenase (SANTYL) ointment Apply topically daily. To foot wound  15 g  0  . esomeprazole (NEXIUM) 40 MG capsule Take 40 mg by mouth daily before breakfast.      . gabapentin (NEURONTIN) 600 MG tablet Take 2 tablets (1,200 mg total) by mouth 2 (two) times daily.      . hydrochlorothiazide (HYDRODIURIL) 25 MG tablet Take 25 mg by mouth daily.      . insulin glargine (LANTUS) 100 units/mL SOLN Inject 15 Units into the skin at bedtime.  1 vial  0  . Insulin Pen Needle (FIFTY50 PEN NEEDLES) 31G X 5 MM MISC 1 each by Does not apply route at bedtime.  30 each  0  . levothyroxine (SYNTHROID, LEVOTHROID) 75 MCG tablet Take 75 mcg by mouth daily before breakfast.      . Pitavastatin Calcium (LIVALO) 4 MG TABS Take 4 mg by mouth 2 (two) times a week.      . sulfamethoxazole-trimethoprim (BACTRIM DS) 800-160 MG per tablet Take 1 tablet by mouth 2 (two) times daily.  14 tablet  0  . tadalafil (CIALIS) 20 MG tablet   Metformin 500 mg twice a day Take 20 mg by mouth daily as needed for erectile dysfunction.       No current facility-administered medications on file prior to visit.   Allergies  Allergen Reactions  . Mushroom  Extract Complex Nausea Only   Family History  Problem Relation Age of Onset  . Diabetes Mellitus II Mother   . Diabetes Mellitus II Father   . CAD Father    PE: BP 124/68  Pulse 77  Temp(Src) 98 F (36.7 C) (Oral)  Resp 10  Ht 5\' 11"  (1.803 m)  Wt 233 lb (105.688 kg)  BMI 32.51 kg/m2  SpO2 97% Wt Readings from Last 3 Encounters:  10/13/12 233 lb (105.688 kg)  09/15/12 225 lb (102.059 kg)  - pt weighed with cast and boot  Constitutional: overweight, in  NAD Eyes: PERRLA, EOMI, no exophthalmos ENT: moist mucous membranes, no thyromegaly, no cervical lymphadenopathy Cardiovascular: RRR, No MRG Respiratory: CTA B Gastrointestinal: abdomen soft, NT, ND, BS+ Musculoskeletal: no deformities, strength intact in all 4; left leg in boot, could not examine Skin: moist, warm, no rashes Neurological: no tremor with outstretched hands, DTR normal in all 4  ASSESSMENT: 1. DM2, insulin-dependent, uncontrolled, with complications - Periph. Neuropathy - on neurontin - CAD - s/p CABG 8 years ago - ?CKD vs AKD (Cr 1.6 - 09/2012) - ED - on Cialis - left foot diabetic ulcer - likely Pseudomonas, on ciprofloxacin  2. Hypothyroidism - last TSH 2.54 on 09/15/2012 - on Levothyroxine 75 mcg daily  PLAN:  1. DM2 Patient with newly diagnosed diabetes, after a long history of prediabetes. He has several complications from diabetes (see above). Upon discharge from the hospital, he was continued on Lantus 15 units at night and half maximal dose of metformin, 500 mg twice a day. He is tolerating these well, however his a.m. sugars are still above goal, up to 145. We did discuss about goal blood sugars for him in the morning being approximately 90-120. To achieve this, I advised him to increase the metformin to target dose of 1000 mg twice a day.  - continue Lantus at 15 units for now, but we might be able to titrate it off - I did advise him to start writing his sugars down and to check at different  times of the day, so we can determined whether he needs mealtime coverage, too. If he does, we can use a DPP4 inhibitor. - He recently moved in the area and would like to establish care with me. He also needs to find a primary care doctor, and I placed a referral to [facility] medicine office - given sugar log and advised how to fill it and to bring it at next appt - check 1-2 x a day rotating check times - given foot care handout and explained the principles - given instructions for hypoglycemia management "15-15 rule" - advised to continue to get eye exams annually - I refilled his metformin - we can switch to 1000 mg tablets at next visit, if he tolerates it well - I will see him in a month with a sugar log  2. Hypothyroidism - The patient mentions that he would want me to manage his hypothyroidism. He recently had a TSH of 2.54 one month ago, so we will continue the current dose of levothyroxine 75 mcg daily

## 2012-10-13 NOTE — Patient Instructions (Addendum)
Please return in 1 month with your sugar log.   Please increase Metformin to target dose of 1000 mg 2x a day.  Try to read Dr. Janene Harvey book: "Program for Reversing Diabetes" for the vegan concept and other ideas for healthy eating.

## 2012-11-06 ENCOUNTER — Encounter (HOSPITAL_BASED_OUTPATIENT_CLINIC_OR_DEPARTMENT_OTHER): Payer: Managed Care, Other (non HMO) | Attending: General Surgery

## 2012-11-06 ENCOUNTER — Other Ambulatory Visit: Payer: Self-pay | Admitting: *Deleted

## 2012-11-06 ENCOUNTER — Encounter: Payer: Self-pay | Admitting: Internal Medicine

## 2012-11-06 DIAGNOSIS — L97509 Non-pressure chronic ulcer of other part of unspecified foot with unspecified severity: Secondary | ICD-10-CM | POA: Insufficient documentation

## 2012-11-06 DIAGNOSIS — L84 Corns and callosities: Secondary | ICD-10-CM | POA: Insufficient documentation

## 2012-11-06 DIAGNOSIS — E1169 Type 2 diabetes mellitus with other specified complication: Secondary | ICD-10-CM | POA: Insufficient documentation

## 2012-11-06 MED ORDER — ONETOUCH LANCETS MISC: Status: DC

## 2012-11-06 MED ORDER — GLUCOSE BLOOD VI STRP: ORAL_STRIP | Status: DC

## 2012-11-13 ENCOUNTER — Ambulatory Visit (INDEPENDENT_AMBULATORY_CARE_PROVIDER_SITE_OTHER): Payer: Managed Care, Other (non HMO) | Admitting: Internal Medicine

## 2012-11-13 ENCOUNTER — Encounter: Payer: Self-pay | Admitting: Internal Medicine

## 2012-11-13 VITALS — BP 122/68 | HR 75 | Temp 97.6°F | Resp 12 | Ht 71.0 in | Wt 232.0 lb

## 2012-11-13 DIAGNOSIS — E039 Hypothyroidism, unspecified: Secondary | ICD-10-CM

## 2012-11-13 DIAGNOSIS — E119 Type 2 diabetes mellitus without complications: Secondary | ICD-10-CM

## 2012-11-13 LAB — BASIC METABOLIC PANEL
BUN: 43 mg/dL — ABNORMAL HIGH (ref 6–23)
CO2: 24 mEq/L (ref 19–32)
Calcium: 9.3 mg/dL (ref 8.4–10.5)
Chloride: 104 mEq/L (ref 96–112)
Creatinine, Ser: 2 mg/dL — ABNORMAL HIGH (ref 0.4–1.5)
GFR: 36.23 mL/min — ABNORMAL LOW (ref >60.00)
Glucose, Bld: 173 mg/dL — ABNORMAL HIGH (ref 70–99)
Potassium: 4.2 mEq/L (ref 3.5–5.1)
Sodium: 137 mEq/L (ref 135–145)

## 2012-11-13 MED ORDER — LINAGLIPTIN 5 MG PO TABS: 5.0000 mg | ORAL_TABLET | Freq: Every day | ORAL | Status: DC

## 2012-11-13 NOTE — Patient Instructions (Addendum)
Please return in 3 months with your sugar log.  Continue Metformin at 1000 mg 2x a day with meals. Start Linagliptin (Tradjenta) 5 mg daily in am. Hold Lantus and let me know if sugars in am consistently >130.  Please stop at the lab.

## 2012-11-13 NOTE — Progress Notes (Signed)
Patient ID: [name], male   DOB: February 05, 1951, 62 y.o.   MRN: [id number]  HPI: Dejonte Burnett is a 62 y.o.-year-old male, returning for followup for newly diagnosed DM2, insulin-dependent, uncontrolled, with complications (peripheral neuropathy, CAD - s/p CABG, ?CKD, ED, diabetic ulcer). He will also f/u with me for his hypothyrodism.  Pt has been formally diagnosed with diabetes in 09/2012 when he was hospitalized for diabetic foot ulcer with left foot cellulitis (sent to the ED by podiatrist). He also had ?early osteomyelitis in 5th L metatarsal - still on Cipro, he was seen at the wound center today and his wound was debrided. He is having considerable pain, only taking Tylenol.  Last hemoglobin A1c was: Lab Results  Component Value Date   HGBA1C 10.0* 09/15/2012  Sugars in the hospital have been 199-328, but much improved since then.  Pt is on a regimen of: - Metformin 1000 mg po bid - increased from 500 mg bid at last visit - Lantus 15 units qhs  Pt checks his sugars once a day and they are: - am: 125-145 > 90-140 - b'f lunch: 128-140 - b'f dinner: 102-104 No lows. Lowest sugar was 90; ? If has hypoglycemia awareness. Highest sugar was 140. He lifts weights 3 times a week for exercise.  Pt has CKD, last BUN/creatinine:  Lab Results  Component Value Date   BUN 25* 09/16/2012   CREATININE 1.60* 09/16/2012  On Benazepril.  - last eye exam was 6 mo ago. No DR.  - Denies numbness and tingling in his legs in Neurontin. Left leg worse. Sees Podiatry.   I reviewed his chart and he also has a history of gout-on Allopurinol, also has gout arthropathy, HTN, HL-on Pitavastatin, hypothyroidism-on levothyroxine 75, last TSH 2.54; GERD; enlarged prostate on the CT scan obtained in the hospital.   I reviewed pt's medications, allergies, PMH, social hx, family hx and no changes required, except as mentioned above.  ROS: Constitutional: no weight gain/loss, increased appetite; no fatigue, no  subjective hyperthermia/hypothermia; burning with urination, nocturia, poor sleep Eyes: no blurry vision, no xerophthalmia ENT: no sore throat, no nodules palpated in throat, no dysphagia/odynophagia, no hoarseness Cardiovascular: no CP/SOB/palpitations/leg swelling Respiratory: no cough/SOB Gastrointestinal: no N/V/+D/no C Musculoskeletal: no muscle/joint aches Skin: ? Brown discoloration on bilateral lower extremities, question stasis dermatitis Difficulty with erections  PE: BP 122/68  Pulse 75  Temp(Src) 97.6 F (36.4 C) (Oral)  Resp 12  Ht 5\' 11"  (1.803 m)  Wt 232 lb (105.235 kg)  BMI 32.37 kg/m2  SpO2 97% Wt Readings from Last 3 Encounters:  11/13/12 232 lb (105.235 kg)  10/13/12 233 lb (105.688 kg)  09/15/12 225 lb (102.059 kg)  - pt weighed with cast and boot  Constitutional: overweight, in NAD Eyes: PERRLA, EOMI, no exophthalmos ENT: moist mucous membranes, no thyromegaly, no cervical lymphadenopathy Cardiovascular: RRR, No MRG Respiratory: CTA B Gastrointestinal: abdomen soft, NT, ND, BS+ Musculoskeletal: no deformities, strength intact in all 4; left leg in boot, could not examine Skin: moist, warm, no rashes  ASSESSMENT: 1. DM2, insulin-dependent, uncontrolled, with complications - Periph. Neuropathy - on neurontin - CAD - s/p CABG 8 years ago - CKD (Cr 1.6 - 09/2012) - ED - on Cialis - left foot diabetic ulcer  2. Hypothyroidism - last TSH 2.54 on 09/15/2012 - on Levothyroxine 75 mcg daily  PLAN:  1. DM2 Patient with newly diagnosed diabetes, after a long history of prediabetes. His sugars are almost all at goal in the last 2  weeks, after increasing the metformin the maximum dose, and maintaining Lantus to 15 units daily, but also improving his diet. The patient tells me that he would like to come off Lantus, and this is not an unreasonable goal, and we discussed about alternatives. For now, we can try Tradjenta 5 mg daily. He does not have a history of  pancreatitis. I gave him samples for 2 weeks and sent a supply for 3 months to his pharmacy. -  At last visit, we increased the metformin to target dose of 1000 mg twice a day. We had a long discussion about metformin use in the setting of chronic kidney disease, and I thoroughly explained the guidelines, which recommend stopping metformin if the creatinine is 1.5 in men. I explained the risks, which are very small, but present, for lactic acidosis, and these were especially with another medication in the same class (phenformin). The patient understood the risk and opted to stay on the current dose of metformin, acknowledging the risk for acidosis. They tell me that patient's creatinine of 1.6 was close to his baseline, he has had chronic kidney disease since his 87s, of unknown etiology, but it was considered that NSAIDs have played a role. He did stop Celebrex and Advil, and only using Tylenol for pain. I explained the fact that I would like to check his kidney function again, and will decide about metformin use when the results are back. If we cannot use it (for example his creatinine returns at 2), I believe that he would need to get back on Lantus - we can try to stop Lantus for now, but I told him to let me know through my chart if his sugars in the morning increase consistently higher than 130 - he did not establish care yet with a PCP at [facility], has an appointment in more than a month with Dr. Ronnald Ramp, however he considered this to late, and might schedule an appointment somewhere else - he did a good job filling her sugar log and I advised him to continue to check 1-2 x a day rotating check times - no refills needed today - I will see him in 3 months with his sugar log  2. Hypothyroidism - Last TSH of 2.54 2 months ago, so we will continue the current dose of levothyroxine 75 mcg daily  Office Visit on 11/13/2012  Component Date Value Range Status  . Sodium 11/13/2012 137   135 - 145 mEq/L Final  . Potassium 11/13/2012 4.2  3.5 - 5.1 mEq/L Final  . Chloride 11/13/2012 104  96 - 112 mEq/L Final  . CO2 11/13/2012 24  19 - 32 mEq/L Final  . Glucose, Bld 11/13/2012 173* 70 - 99 mg/dL Final  . BUN 11/13/2012 43* 6 - 23 mg/dL Final  . Creatinine, Ser 11/13/2012 2.0* 0.4 - 1.5 mg/dL Final  . Calcium 11/13/2012 9.3  8.4 - 10.5 mg/dL Final  . GFR 11/13/2012 36.23* >60.00 mL/min Final   Creatinine 2, will stop Metformin. Will try Cycloset. Will need referral to nephrology at next visit.  Msg sent: Dear Mr Rummel, Unfortunately, your creatinine is now 2, so I would NOT recommend using Metformin. Please stop it today.  We can try Cycloset instead, it is a once daily medication (taken in am, with food, within 2 hours of waking up). You can start at 0.8 mg and if you tolerate it, increase to 1.6 mg (2 tabs) after 1 week. We can increase further, but  please let me know if your sugars are not at goal in 2 weeks on the higher dose. Side effects can be: dizziness, congestion, headache (also some weight gain). I will send it to your pharmacy, but please let me know if you decide to not use this or if it is too expensive. Please let me know if you have any questions. Sincerely, Philemon Kingdom MD

## 2012-11-16 ENCOUNTER — Encounter: Payer: Self-pay | Admitting: Internal Medicine

## 2012-11-16 MED ORDER — BROMOCRIPTINE MESYLATE 0.8 MG PO TABS: ORAL_TABLET | ORAL | Status: DC

## 2012-11-17 ENCOUNTER — Encounter: Payer: Self-pay | Admitting: Internal Medicine

## 2012-11-18 ENCOUNTER — Encounter: Payer: Self-pay | Admitting: Internal Medicine

## 2012-12-11 ENCOUNTER — Encounter (HOSPITAL_BASED_OUTPATIENT_CLINIC_OR_DEPARTMENT_OTHER): Payer: Managed Care, Other (non HMO) | Attending: General Surgery

## 2012-12-11 DIAGNOSIS — E1169 Type 2 diabetes mellitus with other specified complication: Secondary | ICD-10-CM | POA: Insufficient documentation

## 2012-12-11 DIAGNOSIS — L97509 Non-pressure chronic ulcer of other part of unspecified foot with unspecified severity: Secondary | ICD-10-CM | POA: Insufficient documentation

## 2012-12-18 ENCOUNTER — Ambulatory Visit: Payer: Managed Care, Other (non HMO) | Admitting: Internal Medicine

## 2013-01-18 ENCOUNTER — Encounter (HOSPITAL_BASED_OUTPATIENT_CLINIC_OR_DEPARTMENT_OTHER): Payer: Managed Care, Other (non HMO) | Attending: General Surgery

## 2013-01-18 DIAGNOSIS — L97509 Non-pressure chronic ulcer of other part of unspecified foot with unspecified severity: Secondary | ICD-10-CM | POA: Insufficient documentation

## 2013-01-18 DIAGNOSIS — E1169 Type 2 diabetes mellitus with other specified complication: Secondary | ICD-10-CM | POA: Insufficient documentation

## 2013-02-11 ENCOUNTER — Other Ambulatory Visit: Payer: Self-pay

## 2013-02-12 ENCOUNTER — Ambulatory Visit: Payer: Managed Care, Other (non HMO) | Admitting: Endocrinology

## 2013-02-15 ENCOUNTER — Encounter (HOSPITAL_BASED_OUTPATIENT_CLINIC_OR_DEPARTMENT_OTHER): Payer: Managed Care, Other (non HMO) | Attending: General Surgery

## 2013-02-15 DIAGNOSIS — E1169 Type 2 diabetes mellitus with other specified complication: Secondary | ICD-10-CM | POA: Insufficient documentation

## 2013-02-15 DIAGNOSIS — L97509 Non-pressure chronic ulcer of other part of unspecified foot with unspecified severity: Secondary | ICD-10-CM | POA: Insufficient documentation

## 2013-02-17 ENCOUNTER — Ambulatory Visit: Payer: Managed Care, Other (non HMO) | Admitting: Endocrinology

## 2013-03-26 ENCOUNTER — Encounter (HOSPITAL_BASED_OUTPATIENT_CLINIC_OR_DEPARTMENT_OTHER): Payer: Managed Care, Other (non HMO) | Attending: General Surgery

## 2013-03-26 DIAGNOSIS — L97509 Non-pressure chronic ulcer of other part of unspecified foot with unspecified severity: Secondary | ICD-10-CM | POA: Insufficient documentation

## 2013-03-26 DIAGNOSIS — E1169 Type 2 diabetes mellitus with other specified complication: Secondary | ICD-10-CM | POA: Insufficient documentation

## 2013-04-14 ENCOUNTER — Encounter (HOSPITAL_BASED_OUTPATIENT_CLINIC_OR_DEPARTMENT_OTHER): Payer: Managed Care, Other (non HMO) | Attending: General Surgery

## 2013-04-14 DIAGNOSIS — E1169 Type 2 diabetes mellitus with other specified complication: Secondary | ICD-10-CM | POA: Insufficient documentation

## 2013-04-14 DIAGNOSIS — L97509 Non-pressure chronic ulcer of other part of unspecified foot with unspecified severity: Secondary | ICD-10-CM | POA: Insufficient documentation

## 2013-06-04 ENCOUNTER — Encounter (HOSPITAL_BASED_OUTPATIENT_CLINIC_OR_DEPARTMENT_OTHER): Payer: Managed Care, Other (non HMO) | Attending: General Surgery

## 2013-06-04 DIAGNOSIS — E1139 Type 2 diabetes mellitus with other diabetic ophthalmic complication: Secondary | ICD-10-CM | POA: Insufficient documentation

## 2013-06-04 DIAGNOSIS — L97509 Non-pressure chronic ulcer of other part of unspecified foot with unspecified severity: Secondary | ICD-10-CM | POA: Insufficient documentation

## 2013-06-16 ENCOUNTER — Encounter (HOSPITAL_BASED_OUTPATIENT_CLINIC_OR_DEPARTMENT_OTHER): Payer: Managed Care, Other (non HMO) | Attending: General Surgery

## 2013-06-16 DIAGNOSIS — E1169 Type 2 diabetes mellitus with other specified complication: Secondary | ICD-10-CM | POA: Insufficient documentation

## 2013-06-16 DIAGNOSIS — L97509 Non-pressure chronic ulcer of other part of unspecified foot with unspecified severity: Secondary | ICD-10-CM | POA: Insufficient documentation

## 2013-07-02 ENCOUNTER — Other Ambulatory Visit (HOSPITAL_COMMUNITY): Payer: Self-pay | Admitting: General Surgery

## 2013-07-02 DIAGNOSIS — M869 Osteomyelitis, unspecified: Secondary | ICD-10-CM

## 2013-07-13 ENCOUNTER — Ambulatory Visit (HOSPITAL_COMMUNITY)
Admission: RE | Admit: 2013-07-13 | Discharge: 2013-07-13 | Disposition: A | Discharge status: Home / Self Care | Payer: Managed Care, Other (non HMO) | Source: Ambulatory Visit | Attending: General Surgery | Admitting: General Surgery

## 2013-07-13 ENCOUNTER — Other Ambulatory Visit (HOSPITAL_COMMUNITY): Payer: Self-pay | Admitting: General Surgery

## 2013-07-13 DIAGNOSIS — M109 Gout, unspecified: Secondary | ICD-10-CM | POA: Insufficient documentation

## 2013-07-13 DIAGNOSIS — M869 Osteomyelitis, unspecified: Secondary | ICD-10-CM

## 2013-07-13 DIAGNOSIS — L97409 Non-pressure chronic ulcer of unspecified heel and midfoot with unspecified severity: Secondary | ICD-10-CM | POA: Insufficient documentation

## 2013-07-13 DIAGNOSIS — M25476 Effusion, unspecified foot: Secondary | ICD-10-CM | POA: Insufficient documentation

## 2013-07-13 DIAGNOSIS — M25473 Effusion, unspecified ankle: Secondary | ICD-10-CM | POA: Insufficient documentation

## 2013-07-16 ENCOUNTER — Encounter (HOSPITAL_BASED_OUTPATIENT_CLINIC_OR_DEPARTMENT_OTHER): Payer: Managed Care, Other (non HMO) | Attending: General Surgery

## 2013-07-16 DIAGNOSIS — L97509 Non-pressure chronic ulcer of other part of unspecified foot with unspecified severity: Secondary | ICD-10-CM | POA: Insufficient documentation

## 2013-07-16 DIAGNOSIS — E1169 Type 2 diabetes mellitus with other specified complication: Secondary | ICD-10-CM | POA: Insufficient documentation

## 2013-08-11 ENCOUNTER — Encounter (HOSPITAL_BASED_OUTPATIENT_CLINIC_OR_DEPARTMENT_OTHER): Payer: Managed Care, Other (non HMO) | Attending: General Surgery

## 2013-08-11 DIAGNOSIS — E1169 Type 2 diabetes mellitus with other specified complication: Secondary | ICD-10-CM | POA: Insufficient documentation

## 2013-08-11 DIAGNOSIS — L97509 Non-pressure chronic ulcer of other part of unspecified foot with unspecified severity: Secondary | ICD-10-CM | POA: Insufficient documentation

## 2013-09-15 ENCOUNTER — Encounter (HOSPITAL_BASED_OUTPATIENT_CLINIC_OR_DEPARTMENT_OTHER): Payer: Managed Care, Other (non HMO) | Attending: General Surgery

## 2013-09-15 DIAGNOSIS — L97509 Non-pressure chronic ulcer of other part of unspecified foot with unspecified severity: Secondary | ICD-10-CM | POA: Insufficient documentation

## 2013-09-15 DIAGNOSIS — L84 Corns and callosities: Secondary | ICD-10-CM | POA: Insufficient documentation

## 2013-09-15 DIAGNOSIS — E1169 Type 2 diabetes mellitus with other specified complication: Secondary | ICD-10-CM | POA: Insufficient documentation

## 2013-09-15 DIAGNOSIS — G5790 Unspecified mononeuropathy of unspecified lower limb: Secondary | ICD-10-CM | POA: Insufficient documentation

## 2013-10-06 ENCOUNTER — Encounter (HOSPITAL_BASED_OUTPATIENT_CLINIC_OR_DEPARTMENT_OTHER): Payer: Managed Care, Other (non HMO) | Attending: General Surgery

## 2013-10-06 DIAGNOSIS — E1169 Type 2 diabetes mellitus with other specified complication: Secondary | ICD-10-CM | POA: Diagnosis present

## 2013-10-06 DIAGNOSIS — L97509 Non-pressure chronic ulcer of other part of unspecified foot with unspecified severity: Secondary | ICD-10-CM | POA: Diagnosis not present

## 2013-10-13 DIAGNOSIS — E1169 Type 2 diabetes mellitus with other specified complication: Secondary | ICD-10-CM | POA: Diagnosis not present

## 2013-10-20 DIAGNOSIS — E1169 Type 2 diabetes mellitus with other specified complication: Secondary | ICD-10-CM | POA: Diagnosis not present

## 2013-10-27 DIAGNOSIS — E1169 Type 2 diabetes mellitus with other specified complication: Secondary | ICD-10-CM | POA: Diagnosis not present

## 2013-11-03 ENCOUNTER — Telehealth: Payer: Self-pay

## 2013-11-03 DIAGNOSIS — E1169 Type 2 diabetes mellitus with other specified complication: Secondary | ICD-10-CM | POA: Diagnosis not present

## 2013-11-03 NOTE — Telephone Encounter (Signed)
Diabetic Bundle. Pt no longer with provider.

## 2013-11-10 ENCOUNTER — Encounter (HOSPITAL_BASED_OUTPATIENT_CLINIC_OR_DEPARTMENT_OTHER): Payer: Managed Care, Other (non HMO) | Attending: General Surgery

## 2013-11-10 DIAGNOSIS — E1169 Type 2 diabetes mellitus with other specified complication: Secondary | ICD-10-CM | POA: Diagnosis present

## 2013-11-10 DIAGNOSIS — L97409 Non-pressure chronic ulcer of unspecified heel and midfoot with unspecified severity: Secondary | ICD-10-CM | POA: Diagnosis not present

## 2013-11-26 DIAGNOSIS — E1169 Type 2 diabetes mellitus with other specified complication: Secondary | ICD-10-CM | POA: Diagnosis not present

## 2013-12-01 DIAGNOSIS — E1169 Type 2 diabetes mellitus with other specified complication: Secondary | ICD-10-CM | POA: Diagnosis not present

## 2013-12-22 ENCOUNTER — Other Ambulatory Visit (HOSPITAL_COMMUNITY): Payer: Self-pay | Admitting: General Surgery

## 2013-12-22 ENCOUNTER — Encounter (HOSPITAL_BASED_OUTPATIENT_CLINIC_OR_DEPARTMENT_OTHER): Payer: Managed Care, Other (non HMO) | Attending: General Surgery

## 2013-12-22 ENCOUNTER — Ambulatory Visit (HOSPITAL_COMMUNITY)
Admission: RE | Admit: 2013-12-22 | Discharge: 2013-12-22 | Disposition: A | Discharge status: Home / Self Care | Payer: Managed Care, Other (non HMO) | Source: Ambulatory Visit | Attending: General Surgery | Admitting: General Surgery

## 2013-12-22 DIAGNOSIS — M19079 Primary osteoarthritis, unspecified ankle and foot: Secondary | ICD-10-CM | POA: Diagnosis not present

## 2013-12-22 DIAGNOSIS — X58XXXA Exposure to other specified factors, initial encounter: Secondary | ICD-10-CM | POA: Insufficient documentation

## 2013-12-22 DIAGNOSIS — I10 Essential (primary) hypertension: Secondary | ICD-10-CM | POA: Insufficient documentation

## 2013-12-22 DIAGNOSIS — Z9889 Other specified postprocedural states: Secondary | ICD-10-CM | POA: Diagnosis not present

## 2013-12-22 DIAGNOSIS — E1169 Type 2 diabetes mellitus with other specified complication: Secondary | ICD-10-CM | POA: Insufficient documentation

## 2013-12-22 DIAGNOSIS — M869 Osteomyelitis, unspecified: Secondary | ICD-10-CM | POA: Diagnosis present

## 2013-12-22 DIAGNOSIS — L97409 Non-pressure chronic ulcer of unspecified heel and midfoot with unspecified severity: Secondary | ICD-10-CM | POA: Insufficient documentation

## 2013-12-22 DIAGNOSIS — S91309A Unspecified open wound, unspecified foot, initial encounter: Secondary | ICD-10-CM | POA: Diagnosis not present

## 2013-12-22 DIAGNOSIS — Z9289 Personal history of other medical treatment: Secondary | ICD-10-CM

## 2013-12-24 ENCOUNTER — Encounter (HOSPITAL_COMMUNITY): Payer: Self-pay | Admitting: Emergency Medicine

## 2013-12-24 ENCOUNTER — Inpatient Hospital Stay (HOSPITAL_COMMUNITY)
Admission: EM | Admit: 2013-12-24 | Discharge: 2013-12-27 | DRG: 041 | Disposition: A | Discharge status: Home Health | Payer: Managed Care, Other (non HMO) | Attending: Internal Medicine | Admitting: Internal Medicine

## 2013-12-24 ENCOUNTER — Encounter (HOSPITAL_BASED_OUTPATIENT_CLINIC_OR_DEPARTMENT_OTHER): Payer: Managed Care, Other (non HMO)

## 2013-12-24 DIAGNOSIS — M869 Osteomyelitis, unspecified: Secondary | ICD-10-CM | POA: Diagnosis present

## 2013-12-24 DIAGNOSIS — E119 Type 2 diabetes mellitus without complications: Secondary | ICD-10-CM | POA: Diagnosis present

## 2013-12-24 DIAGNOSIS — E1169 Type 2 diabetes mellitus with other specified complication: Secondary | ICD-10-CM | POA: Diagnosis present

## 2013-12-24 DIAGNOSIS — Z7982 Long term (current) use of aspirin: Secondary | ICD-10-CM

## 2013-12-24 DIAGNOSIS — L97509 Non-pressure chronic ulcer of other part of unspecified foot with unspecified severity: Secondary | ICD-10-CM | POA: Diagnosis present

## 2013-12-24 DIAGNOSIS — E1369 Other specified diabetes mellitus with other specified complication: Secondary | ICD-10-CM

## 2013-12-24 DIAGNOSIS — Z9119 Patient's noncompliance with other medical treatment and regimen: Secondary | ICD-10-CM | POA: Diagnosis not present

## 2013-12-24 DIAGNOSIS — E1149 Type 2 diabetes mellitus with other diabetic neurological complication: Principal | ICD-10-CM | POA: Diagnosis present

## 2013-12-24 DIAGNOSIS — E039 Hypothyroidism, unspecified: Secondary | ICD-10-CM | POA: Diagnosis present

## 2013-12-24 DIAGNOSIS — Z91199 Patient's noncompliance with other medical treatment and regimen due to unspecified reason: Secondary | ICD-10-CM

## 2013-12-24 DIAGNOSIS — Z79899 Other long term (current) drug therapy: Secondary | ICD-10-CM

## 2013-12-24 DIAGNOSIS — L089 Local infection of the skin and subcutaneous tissue, unspecified: Secondary | ICD-10-CM | POA: Diagnosis present

## 2013-12-24 DIAGNOSIS — L03039 Cellulitis of unspecified toe: Secondary | ICD-10-CM

## 2013-12-24 DIAGNOSIS — I10 Essential (primary) hypertension: Secondary | ICD-10-CM | POA: Diagnosis present

## 2013-12-24 DIAGNOSIS — L97409 Non-pressure chronic ulcer of unspecified heel and midfoot with unspecified severity: Secondary | ICD-10-CM | POA: Diagnosis not present

## 2013-12-24 DIAGNOSIS — I2510 Atherosclerotic heart disease of native coronary artery without angina pectoris: Secondary | ICD-10-CM | POA: Diagnosis present

## 2013-12-24 DIAGNOSIS — E1142 Type 2 diabetes mellitus with diabetic polyneuropathy: Secondary | ICD-10-CM | POA: Diagnosis present

## 2013-12-24 DIAGNOSIS — N183 Chronic kidney disease, stage 3 unspecified: Secondary | ICD-10-CM | POA: Diagnosis present

## 2013-12-24 DIAGNOSIS — E1159 Type 2 diabetes mellitus with other circulatory complications: Secondary | ICD-10-CM | POA: Diagnosis present

## 2013-12-24 DIAGNOSIS — Z87891 Personal history of nicotine dependence: Secondary | ICD-10-CM | POA: Diagnosis not present

## 2013-12-24 DIAGNOSIS — Z951 Presence of aortocoronary bypass graft: Secondary | ICD-10-CM

## 2013-12-24 DIAGNOSIS — E785 Hyperlipidemia, unspecified: Secondary | ICD-10-CM | POA: Diagnosis present

## 2013-12-24 DIAGNOSIS — E7800 Pure hypercholesterolemia, unspecified: Secondary | ICD-10-CM

## 2013-12-24 DIAGNOSIS — E11621 Type 2 diabetes mellitus with foot ulcer: Secondary | ICD-10-CM | POA: Diagnosis present

## 2013-12-24 DIAGNOSIS — E038 Other specified hypothyroidism: Secondary | ICD-10-CM

## 2013-12-24 DIAGNOSIS — M9080 Osteopathy in diseases classified elsewhere, unspecified site: Secondary | ICD-10-CM | POA: Diagnosis present

## 2013-12-24 DIAGNOSIS — L02619 Cutaneous abscess of unspecified foot: Secondary | ICD-10-CM | POA: Diagnosis present

## 2013-12-24 DIAGNOSIS — L03032 Cellulitis of left toe: Secondary | ICD-10-CM

## 2013-12-24 DIAGNOSIS — L03119 Cellulitis of unspecified part of limb: Secondary | ICD-10-CM

## 2013-12-24 DIAGNOSIS — I129 Hypertensive chronic kidney disease with stage 1 through stage 4 chronic kidney disease, or unspecified chronic kidney disease: Secondary | ICD-10-CM | POA: Diagnosis present

## 2013-12-24 DIAGNOSIS — E1122 Type 2 diabetes mellitus with diabetic chronic kidney disease: Secondary | ICD-10-CM

## 2013-12-24 DIAGNOSIS — L97529 Non-pressure chronic ulcer of other part of left foot with unspecified severity: Secondary | ICD-10-CM | POA: Diagnosis present

## 2013-12-24 DIAGNOSIS — E08621 Diabetes mellitus due to underlying condition with foot ulcer: Secondary | ICD-10-CM

## 2013-12-24 DIAGNOSIS — G629 Polyneuropathy, unspecified: Secondary | ICD-10-CM

## 2013-12-24 LAB — CBC WITH DIFFERENTIAL/PLATELET
BASOS ABS: 0 10*3/uL (ref 0.0–0.1)
BASOS PCT: 1 % (ref 0–1)
EOS PCT: 4 % (ref 0–5)
Eosinophils Absolute: 0.2 10*3/uL (ref 0.0–0.7)
HCT: 38.2 % — ABNORMAL LOW (ref 39.0–52.0)
Hemoglobin: 12.9 g/dL — ABNORMAL LOW (ref 13.0–17.0)
LYMPHS PCT: 18 % (ref 12–46)
Lymphs Abs: 1 10*3/uL (ref 0.7–4.0)
MCH: 28.9 pg (ref 26.0–34.0)
MCHC: 33.8 g/dL (ref 30.0–36.0)
MCV: 85.5 fL (ref 78.0–100.0)
Monocytes Absolute: 0.4 10*3/uL (ref 0.1–1.0)
Monocytes Relative: 8 % (ref 3–12)
NEUTROS ABS: 3.8 10*3/uL (ref 1.7–7.7)
Neutrophils Relative %: 69 % (ref 43–77)
PLATELETS: 175 10*3/uL (ref 150–400)
RBC: 4.47 MIL/uL (ref 4.22–5.81)
RDW: 14.3 % (ref 11.5–15.5)
WBC: 5.4 10*3/uL (ref 4.0–10.5)

## 2013-12-24 LAB — BASIC METABOLIC PANEL
ANION GAP: 16 — AB (ref 5–15)
BUN: 48 mg/dL — ABNORMAL HIGH (ref 6–23)
CALCIUM: 9.2 mg/dL (ref 8.4–10.5)
CHLORIDE: 100 meq/L (ref 96–112)
CO2: 22 meq/L (ref 19–32)
Creatinine, Ser: 2.25 mg/dL — ABNORMAL HIGH (ref 0.50–1.35)
GFR calc non Af Amer: 29 mL/min — ABNORMAL LOW (ref >90)
GFR, EST AFRICAN AMERICAN: 34 mL/min — AB (ref >90)
Glucose, Bld: 155 mg/dL — ABNORMAL HIGH (ref 70–99)
Potassium: 4.5 mEq/L (ref 3.7–5.3)
SODIUM: 138 meq/L (ref 137–147)

## 2013-12-24 LAB — GLUCOSE, CAPILLARY: GLUCOSE-CAPILLARY: 128 mg/dL — AB (ref 70–99)

## 2013-12-24 MED ORDER — ALUM & MAG HYDROXIDE-SIMETH 200-200-20 MG/5ML PO SUSP: 30.0000 mL | Freq: Four times a day (QID) | ORAL | Status: DC | PRN

## 2013-12-24 MED ORDER — CARVEDILOL 3.125 MG PO TABS
3.1250 mg | ORAL_TABLET | Freq: Two times a day (BID) | ORAL | Status: DC
  Administered 2013-12-25 – 2013-12-27 (×5): 3.125 mg via ORAL
  Filled 2013-12-24 (×7): qty 1

## 2013-12-24 MED ORDER — ONDANSETRON HCL 4 MG PO TABS: 4.0000 mg | ORAL_TABLET | Freq: Four times a day (QID) | ORAL | Status: DC | PRN

## 2013-12-24 MED ORDER — ASPIRIN EC 81 MG PO TBEC
81.0000 mg | DELAYED_RELEASE_TABLET | Freq: Every day | ORAL | Status: DC
  Administered 2013-12-25 – 2013-12-27 (×3): 81 mg via ORAL
  Filled 2013-12-24 (×3): qty 1

## 2013-12-24 MED ORDER — LINAGLIPTIN 5 MG PO TABS
5.0000 mg | ORAL_TABLET | Freq: Every day | ORAL | Status: DC
  Administered 2013-12-25 – 2013-12-27 (×3): 5 mg via ORAL
  Filled 2013-12-24 (×3): qty 1

## 2013-12-24 MED ORDER — EZETIMIBE 10 MG PO TABS
10.0000 mg | ORAL_TABLET | Freq: Every day | ORAL | Status: DC
  Administered 2013-12-25 – 2013-12-27 (×3): 10 mg via ORAL
  Filled 2013-12-24 (×3): qty 1

## 2013-12-24 MED ORDER — HYDROCHLOROTHIAZIDE 25 MG PO TABS
25.0000 mg | ORAL_TABLET | Freq: Every day | ORAL | Status: DC
  Administered 2013-12-25 – 2013-12-27 (×3): 25 mg via ORAL
  Filled 2013-12-24 (×3): qty 1

## 2013-12-24 MED ORDER — GABAPENTIN 400 MG PO CAPS
1200.0000 mg | ORAL_CAPSULE | Freq: Three times a day (TID) | ORAL | Status: DC
  Administered 2013-12-24 – 2013-12-27 (×7): 1200 mg via ORAL
  Filled 2013-12-24 (×10): qty 3

## 2013-12-24 MED ORDER — FLORA-Q PO CAPS
1.0000 | ORAL_CAPSULE | Freq: Every day | ORAL | Status: DC
  Administered 2013-12-25 – 2013-12-27 (×3): 1 via ORAL
  Filled 2013-12-24 (×3): qty 1

## 2013-12-24 MED ORDER — SODIUM CHLORIDE 0.9 % IV SOLN
INTRAVENOUS | Status: DC
  Administered 2013-12-24: 19:00:00 via INTRAVENOUS

## 2013-12-24 MED ORDER — AMLODIPINE BESYLATE 5 MG PO TABS
5.0000 mg | ORAL_TABLET | Freq: Every day | ORAL | Status: DC
  Administered 2013-12-25 – 2013-12-27 (×3): 5 mg via ORAL
  Filled 2013-12-24 (×3): qty 1

## 2013-12-24 MED ORDER — ACETAMINOPHEN 325 MG PO TABS: 650.0000 mg | ORAL_TABLET | Freq: Four times a day (QID) | ORAL | Status: DC | PRN

## 2013-12-24 MED ORDER — ACETAMINOPHEN 650 MG RE SUPP: 650.0000 mg | Freq: Four times a day (QID) | RECTAL | Status: DC | PRN

## 2013-12-24 MED ORDER — INSULIN ASPART 100 UNIT/ML ~~LOC~~ SOLN
0.0000 [IU] | Freq: Three times a day (TID) | SUBCUTANEOUS | Status: DC
  Administered 2013-12-25: 2 [IU] via SUBCUTANEOUS
  Administered 2013-12-25 – 2013-12-26 (×2): 1 [IU] via SUBCUTANEOUS
  Administered 2013-12-26: 2 [IU] via SUBCUTANEOUS
  Administered 2013-12-26 – 2013-12-27 (×2): 3 [IU] via SUBCUTANEOUS

## 2013-12-24 MED ORDER — LEVOTHYROXINE SODIUM 88 MCG PO TABS
88.0000 ug | ORAL_TABLET | Freq: Every day | ORAL | Status: DC
  Administered 2013-12-25 – 2013-12-27 (×3): 88 ug via ORAL
  Filled 2013-12-24 (×4): qty 1

## 2013-12-24 MED ORDER — ONDANSETRON HCL 4 MG/2ML IJ SOLN: 4.0000 mg | Freq: Four times a day (QID) | INTRAMUSCULAR | Status: DC | PRN

## 2013-12-24 MED ORDER — HYDROMORPHONE HCL 1 MG/ML IJ SOLN
0.5000 mg | INTRAMUSCULAR | Status: DC | PRN
  Administered 2013-12-24 – 2013-12-25 (×3): 1 mg via INTRAVENOUS
  Filled 2013-12-24 (×3): qty 1

## 2013-12-24 MED ORDER — MORPHINE SULFATE 2 MG/ML IJ SOLN
2.0000 mg | Freq: Once | INTRAMUSCULAR | Status: AC
Start: 2013-12-24 — End: 2013-12-24
  Administered 2013-12-24: 2 mg via INTRAVENOUS
  Filled 2013-12-24: qty 1

## 2013-12-24 MED ORDER — SODIUM CHLORIDE 0.9 % IV SOLN
INTRAVENOUS | Status: DC
  Administered 2013-12-24: 22:00:00 via INTRAVENOUS

## 2013-12-24 MED ORDER — INSULIN ASPART 100 UNIT/ML ~~LOC~~ SOLN: 0.0000 [IU] | Freq: Every day | SUBCUTANEOUS | Status: DC

## 2013-12-24 MED ORDER — ALLOPURINOL 300 MG PO TABS
300.0000 mg | ORAL_TABLET | Freq: Every day | ORAL | Status: DC
  Administered 2013-12-25 – 2013-12-27 (×3): 300 mg via ORAL
  Filled 2013-12-24 (×3): qty 1

## 2013-12-24 MED ORDER — PANTOPRAZOLE SODIUM 40 MG PO TBEC
40.0000 mg | DELAYED_RELEASE_TABLET | Freq: Every day | ORAL | Status: DC
  Administered 2013-12-25 – 2013-12-27 (×3): 40 mg via ORAL
  Filled 2013-12-24 (×3): qty 1

## 2013-12-24 MED ORDER — OMEGA-3-ACID ETHYL ESTERS 1 G PO CAPS
1.0000 g | ORAL_CAPSULE | Freq: Two times a day (BID) | ORAL | Status: DC
  Administered 2013-12-24 – 2013-12-27 (×6): 1 g via ORAL
  Filled 2013-12-24 (×7): qty 1

## 2013-12-24 MED ORDER — AMLODIPINE BESY-BENAZEPRIL HCL 5-10 MG PO CAPS: 1.0000 | ORAL_CAPSULE | Freq: Every day | ORAL | Status: DC

## 2013-12-24 MED ORDER — MAGNESIUM OXIDE 400 (241.3 MG) MG PO TABS
200.0000 mg | ORAL_TABLET | Freq: Every day | ORAL | Status: DC
  Administered 2013-12-25 – 2013-12-27 (×3): 200 mg via ORAL
  Filled 2013-12-24 (×4): qty 0.5

## 2013-12-24 MED ORDER — DIAZEPAM 2 MG PO TABS
2.0000 mg | ORAL_TABLET | Freq: Once | ORAL | Status: AC
  Administered 2013-12-24: 2 mg via ORAL
  Filled 2013-12-24: qty 1

## 2013-12-24 MED ORDER — ENOXAPARIN SODIUM 40 MG/0.4ML ~~LOC~~ SOLN
40.0000 mg | Freq: Every day | SUBCUTANEOUS | Status: DC
  Administered 2013-12-24 – 2013-12-26 (×3): 40 mg via SUBCUTANEOUS
  Filled 2013-12-24 (×4): qty 0.4

## 2013-12-24 MED ORDER — BENAZEPRIL HCL 10 MG PO TABS
10.0000 mg | ORAL_TABLET | Freq: Every day | ORAL | Status: DC
  Administered 2013-12-25 – 2013-12-27 (×3): 10 mg via ORAL
  Filled 2013-12-24 (×3): qty 1

## 2013-12-24 MED ORDER — OXYCODONE HCL 5 MG PO TABS
5.0000 mg | ORAL_TABLET | ORAL | Status: DC | PRN
  Administered 2013-12-25 – 2013-12-27 (×5): 5 mg via ORAL
  Filled 2013-12-24 (×5): qty 1

## 2013-12-24 MED ORDER — VANCOMYCIN HCL IN DEXTROSE 1-5 GM/200ML-% IV SOLN
1000.0000 mg | Freq: Once | INTRAVENOUS | Status: AC
  Administered 2013-12-24: 1000 mg via INTRAVENOUS
  Filled 2013-12-24: qty 200

## 2013-12-24 MED ORDER — ATORVASTATIN CALCIUM 40 MG PO TABS
40.0000 mg | ORAL_TABLET | Freq: Every day | ORAL | Status: DC
  Administered 2013-12-25 – 2013-12-26 (×2): 40 mg via ORAL
  Filled 2013-12-24 (×3): qty 1

## 2013-12-24 NOTE — Progress Notes (Signed)
[facility]  NAME:  [name], [name] NO.:  MEDICAL RECORD NO.:  [id number]      DATE OF BIRTH:  07-10-1950  PHYSICIAN:  Judene Companion, M.D.           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  He is 63 years of age.  He has had a great problem with a diabetic foot ulcer over his plantar aspect of his fifth MP joint, left foot.  He has normal blood pressures, normal temperatures.  He weighs a little over 200 pounds.  He has been treated with Apligrafs.  He has been on Bactrim.  He has cultured out MRSA on his cultures and x-ray in the past has been very suspicious for osteomyelitis involving his fifth MP joint.  He has been classified as a Wagner 3 diabetic foot ulcer, left foot.  Lately, he has been treated with offloading with felt and diabetic shoes and his ulcer was just about healed and we were treating it with silver collagen along with the offloading.  Somehow over the last week, this ulcer has become much worse rather than being a couple of millimeters in diameter.  It is about a 1.5 cm and there was a lot of infection and cellulitis spreading up his dorsal aspect of his foot.  I got cultures on him and started him on doxycycline and I would like to continue using Dermagrafts, antibiotics, offloading and I think that hyperbaric oxygen is in order as we have been treating him for months where he waxes and wanes with any sort of improvement on this diabetic ulcer.  In light of the x-rays and findings compatible with early osteomyelitis, I want to continue him on long-term antibiotics, offloading, several types of dressings including Dermagrafts or Apligrafs, and I would like to incorporate hyperbaric oxygen.     Judene Companion, M.D.     PP/MEDQ  D:  12/23/2013  T:  12/24/2013  Job:  [id number]

## 2013-12-24 NOTE — ED Provider Notes (Signed)
CSN: [id number]     Arrival date & time 12/24/13  1531 History   First MD Initiated Contact with Patient 12/24/13 1824     Chief Complaint  Patient presents with  . Wound Check     (Consider location/radiation/quality/duration/timing/severity/associated sxs/prior Treatment) HPI Comments: Patient here with progressive left foot infection x1 week. Was seen at the wound care center and sent her for further evaluation. He has noted increased redness without drainage. No erythema extending up the leg. He is currently taking Bactrim for this. Wound culture is pending at this time. No reported vomiting or recent fever. Symptoms persistent and nothing makes them better.  The history is provided by the patient and the spouse.    Past Medical History  Diagnosis Date  . Coronary artery disease   . Hypertension   . Hypercholesteremia   . Neuropathy   . Diabetes mellitus without complication    Past Surgical History  Procedure Laterality Date  . Coronary artery bypass graft    . Cholecystectomy    . Back surgery     Family History  Problem Relation Age of Onset  . Diabetes Mellitus II Mother   . Diabetes Mellitus II Father   . CAD Father    History  Substance Use Topics  . Smoking status: Former Research scientist (life sciences)  . Smokeless tobacco: Not on file  . Alcohol Use: No    Review of Systems  All other systems reviewed and are negative.     Allergies  Mushroom extract complex  Home Medications   Prior to Admission medications   Medication Sig Start Date End Date Taking? Authorizing Provider  allopurinol (ZYLOPRIM) 300 MG tablet Take 300 mg by mouth daily.    Historical Provider, MD  amLODipine-benazepril (LOTREL) 5-10 MG per capsule Take 1 capsule by mouth daily.    Historical Provider, MD  aspirin 81 MG chewable tablet Chew 81 mg by mouth daily.    Historical Provider, MD  Bromocriptine Mesylate 0.8 MG TABS Take 2 tablets in am, by mouth, with breakfast 11/16/12   Philemon Kingdom, MD    celecoxib (CELEBREX) 100 MG capsule Take 100 mg by mouth daily.    Historical Provider, MD  collagenase (SANTYL) ointment Apply topically daily. To foot wound 09/16/12   Delfina Redwood, MD  esomeprazole (NEXIUM) 40 MG capsule Take 40 mg by mouth daily before breakfast.    Historical Provider, MD  gabapentin (NEURONTIN) 600 MG tablet Take 2 tablets (1,200 mg total) by mouth 2 (two) times daily. 09/16/12   Delfina Redwood, MD  glucose blood test strip Test blood glucose 2 times a day as instructed 11/06/12   Renato Shin, MD  hydrochlorothiazide (HYDRODIURIL) 25 MG tablet Take 25 mg by mouth daily.    Historical Provider, MD  insulin glargine (LANTUS) 100 units/mL SOLN Inject 15 Units into the skin at bedtime. 09/16/12   Delfina Redwood, MD  Insulin Pen Needle (FIFTY50 PEN NEEDLES) 31G X 5 MM MISC 1 each by Does not apply route at bedtime. 09/16/12   Delfina Redwood, MD  levothyroxine (SYNTHROID, LEVOTHROID) 75 MCG tablet Take 75 mcg by mouth daily before breakfast.    Historical Provider, MD  linagliptin (TRADJENTA) 5 MG TABS tablet Take 1 tablet (5 mg total) by mouth daily. 11/13/12   Philemon Kingdom, MD  ONE TOUCH LANCETS MISC Test blood glucose 2 times a day as directed 11/06/12   Renato Shin, MD  Pitavastatin Calcium (LIVALO) 4 MG TABS Take 4 mg by  mouth 2 (two) times a week.    Historical Provider, MD  sulfamethoxazole-trimethoprim (BACTRIM DS) 800-160 MG per tablet Take 1 tablet by mouth 2 (two) times daily. 09/16/12   Delfina Redwood, MD  tadalafil (CIALIS) 20 MG tablet Take 20 mg by mouth daily as needed for erectile dysfunction.    Historical Provider, MD   BP 161/73  Pulse 79  Temp(Src) 98.2 F (36.8 C) (Oral)  Resp 16  SpO2 96% Physical Exam  Nursing note and vitals reviewed. Constitutional: He is oriented to person, place, and time. He appears well-developed and well-nourished.  Non-toxic appearance. No distress.  HENT:  Head: Normocephalic and atraumatic.  Eyes:  Conjunctivae, EOM and lids are normal. Pupils are equal, round, and reactive to light.  Neck: Normal range of motion. Neck supple. No tracheal deviation present. No mass present.  Cardiovascular: Normal rate, regular rhythm and normal heart sounds.  Exam reveals no gallop.   No murmur heard. Pulmonary/Chest: Effort normal and breath sounds normal. No stridor. No respiratory distress. He has no decreased breath sounds. He has no wheezes. He has no rhonchi. He has no rales.  Abdominal: Soft. Normal appearance and bowel sounds are normal. He exhibits no distension. There is no tenderness. There is no rebound and no CVA tenderness.  Musculoskeletal: Normal range of motion. He exhibits no edema and no tenderness.       Feet:  Neurological: He is alert and oriented to person, place, and time. He has normal strength. No cranial nerve deficit or sensory deficit. GCS eye subscore is 4. GCS verbal subscore is 5. GCS motor subscore is 6.  Skin: Skin is warm and dry. No abrasion and no rash noted.  Psychiatric: He has a normal mood and affect. His speech is normal and behavior is normal.    ED Course  Procedures (including critical care time) Labs Review Labs Reviewed - No data to display  Imaging Review Dg Chest 2 View  12/23/2013   CLINICAL DATA:  Hypertension.  History of hyperbaric oxygen therapy.  EXAM: CHEST  2 VIEW  COMPARISON:  None.  FINDINGS: The heart size and mediastinal contours are within normal limits. Sternotomy wires are noted. No pneumothorax or pleural effusion is noted. Both lungs are clear. The visualized skeletal structures are unremarkable.  IMPRESSION: No acute cardiopulmonary abnormality seen.   Electronically Signed   By: Sabino Dick M.D.   On: 12/23/2013 08:37   Dg Foot Complete Left  12/23/2013   CLINICAL DATA:  Left foot wound over the fifth digit  EXAM: LEFT FOOT - COMPLETE 3+ VIEW  COMPARISON:  None.  FINDINGS: There is no acute fracture or dislocation. There is mild  osteoarthritis of the first MTP joint. There is a soft tissue wound overlying the lateral aspect of the fifth metatarsal head. There is no apparent bone destruction or periostitis. There is no subcutaneous emphysema.  IMPRESSION: Soft tissue wound overlying the fifth metatarsal head without radiographic evidence of osteomyelitis. If there is further clinical concern recommend MRI of the left foot without and with intravenous contrast.   Electronically Signed   By: Kathreen Devoid   On: 12/23/2013 08:41     EKG Interpretation None      MDM   Final diagnoses:  None    Patient started on vancomycin for foot infection. Spoke to orthopedics on call he will see the patient in consultation. Patient to be admitted to a MedSurg bed    Leota Jacobsen, MD 12/24/13 2052

## 2013-12-24 NOTE — H&P (Signed)
[facility] Admission History and Physical       [name] [id number] DOB: 1950-11-01 DOA: 12/24/2013  Referring physician: EDP PCP: No PCP Per Patient  Specialists:   Chief Complaint: Worsening Wound  HPI: [name] is a 63 y.o. male with a history of DM2, HTN, Hyperlipidemia, and Diabetic ulcer of the Left Foot who was seen by his wound care Doctor (Dr. Gerarda Gunther sent to the ED due to worsening over the past 3 weeks despite Bactrim Rx.   He reports that he has had this ulcer on his left foot for 1.5 years and has been receiving care   He denies having any fevers or chills.  He was placed on IV Vancomycin in the ED , and referred for medical Admission and Orthopedics Dr. [name] has been consulted to see the patient in the AM.    An MRI of the left foot has also been ordered for the AM to evaluate for possible Osteomyelitis.      Review of Systems:  Constitutional: No Weight Loss, No Weight Gain, Night Sweats, Fevers, Chills, Dizziness, Fatigue, or Generalized Weakness HEENT: No Headaches, Difficulty Swallowing,Tooth/Dental Problems,Sore Throat,  No Sneezing, Rhinitis, Ear Ache, Nasal Congestion, or Post Nasal Drip,  Cardio-vascular:  No Chest pain, Orthopnea, PND, Edema in Lower Extremities, Anasarca, Dizziness, Palpitations  Resp: No Dyspnea, No DOE, No Cough, No Hemoptysis, No Wheezing.    GI: No Heartburn, Indigestion, Abdominal Pain, Nausea, Vomiting, Diarrhea, Hematemesis, Hematochezia, Melena, Change in Bowel Habits,  Loss of Appetite  GU: No Dysuria, Change in Color of Urine, No Urgency or Frequency, No Flank pain.  Musculoskeletal: No Joint Pain or Swelling, No Decreased Range of Motion, No Back Pain.  Neurologic: No Syncope, No Seizures, Muscle Weakness, Paresthesia, Vision Disturbance or Loss, No Diplopia, No Vertigo, No Difficulty Walking,  Skin: +Left Foot Ulcer, +Right Foot Callous,  No Rash or Lesions. Psych: No Change in Mood or Affect, No Depression or  Anxiety, No Memory loss, No Confusion, or Hallucinations   Past Medical History  Diagnosis Date  . Coronary artery disease   . Hypertension   . Hypercholesteremia   . Neuropathy   . Diabetes mellitus without complication     Past Surgical History  Procedure Laterality Date  . Coronary artery bypass graft    . Cholecystectomy    . Back surgery       Prior to Admission medications   Medication Sig Start Date End Date Taking? Authorizing Provider  allopurinol (ZYLOPRIM) 300 MG tablet Take 300 mg by mouth daily.   Yes Historical Provider, MD  amLODipine-benazepril (LOTREL) 5-10 MG per capsule Take 1 capsule by mouth daily.   Yes Historical Provider, MD  aspirin EC 81 MG tablet Take 81 mg by mouth daily.   Yes Historical Provider, MD  carvedilol (COREG) 3.125 MG tablet Take 3.125 mg by mouth 2 (two) times daily with a meal.  12/03/13  Yes Historical Provider, MD  CHROMIUM PO Take 1 tablet by mouth daily.   Yes Historical Provider, MD  CRESTOR 20 MG tablet Take 20 mg by mouth daily.  10/29/13  Yes Historical Provider, MD  DEXILANT 60 MG capsule Take 60 mg by mouth daily.  12/24/13  Yes Historical Provider, MD  ezetimibe (ZETIA) 10 MG tablet Take 10 mg by mouth daily.   Yes Historical Provider, MD  gabapentin (NEURONTIN) 600 MG tablet Take 1,200 mg by mouth 3 (three) times daily.   Yes Historical Provider, MD  hydrochlorothiazide (HYDRODIURIL) 25 MG tablet  Take 25 mg by mouth daily.   Yes Historical Provider, MD  levothyroxine (SYNTHROID, LEVOTHROID) 88 MCG tablet Take 88 mcg by mouth daily before breakfast.   Yes Historical Provider, MD  linagliptin (TRADJENTA) 5 MG TABS tablet Take 5 mg by mouth daily.   Yes Historical Provider, MD  MAGNESIUM PO Take 1 tablet by mouth daily.   Yes Historical Provider, MD  metformin (FORTAMET) 1000 MG (OSM) 24 hr tablet Take 1,000 mg by mouth 2 (two) times daily with a meal.  11/29/13  Yes Historical Provider, MD  Omega-3 Fatty Acids (FISH OIL) 1200 MG CAPS  Take 1,200 mg by mouth 2 (two) times daily.   Yes Historical Provider, MD  Probiotic Product (PROBIOTIC PO) Take 1 capsule by mouth daily.   Yes Historical Provider, MD  Saw Palmetto, Serenoa repens, (SAW PALMETTO PO) Take 1 tablet by mouth daily.   Yes Historical Provider, MD  VOLTAREN 1 % GEL Apply 2 g topically 4 (four) times daily as needed (pain).  11/29/13  Yes Historical Provider, MD  tadalafil (CIALIS) 20 MG tablet Take 20 mg by mouth daily as needed for erectile dysfunction.    Historical Provider, MD     Allergies  Allergen Reactions  . Mushroom Extract Complex Nausea Only     Social History:  reports that he quit smoking about 25 years ago. His smoking use included Cigars. He has never used smokeless tobacco. He reports that he does not drink alcohol or use illicit drugs.     Family History  Problem Relation Age of Onset  . Diabetes Mellitus II Mother   . Diabetes Mellitus II Father   . CAD Father        Physical Exam:  GEN:  Pleasant Well Nourished and Well Developed 63 y.o. Caucasian male examined  and in no acute distress; cooperative with exam Filed Vitals:   12/24/13 1538 12/24/13 2022  BP: 161/73 163/76  Pulse: 79 77  Temp: 98.2 F (36.8 C) 98.4 F (36.9 C)  TempSrc: Oral Oral  Resp: 16   SpO2: 96% 98%   Blood pressure 163/76, pulse 77, temperature 98.4 F (36.9 C), temperature source Oral, resp. rate 16, SpO2 98.00%. PSYCH: He is alert and oriented x4; does not appear anxious does not appear depressed; affect is normal HEENT: Normocephalic and Atraumatic, Mucous membranes pink; PERRLA; EOM intact; Fundi:  Benign;  No scleral icterus, Nares: Patent, Oropharynx: Clear, Fair Dentition,    Neck:  FROM, No Cervical Lymphadenopathy nor Thyromegaly or Carotid Bruit; No JVD; Breasts:: Not examined CHEST WALL: No tenderness CHEST: Normal respiration, clear to auscultation bilaterally HEART: Regular rate and rhythm; no murmurs rubs or gallops BACK: No kyphosis  or scoliosis; No CVA tenderness ABDOMEN: Positive Bowel Sounds, Soft Non-Tender; No Masses, No Organomegaly. Rectal Exam: Not done EXTREMITIES: No Cyanosis, Clubbing, or Edema; + Ulceration of the Lateral Left Foot = Quarter Size just beneath the 5th MTP Area, and + Right lateral Foot Callous Formation Genitalia: not examined PULSES: 2+ and symmetric SKIN: Normal hydration no rash  CNS:  Alert and Oriented x 4, No Focal Deficits  Vascular: pulses palpable throughout    Labs on Admission:  Basic Metabolic Panel:  Recent Labs Lab 12/24/13 1842  NA 138  K 4.5  CL 100  CO2 22  GLUCOSE 155*  BUN 48*  CREATININE 2.25*  CALCIUM 9.2   Liver Function Tests: No results found for this basename: AST, ALT, ALKPHOS, BILITOT, PROT, ALBUMIN,  in the last 168  hours No results found for this basename: LIPASE, AMYLASE,  in the last 168 hours No results found for this basename: AMMONIA,  in the last 168 hours CBC:  Recent Labs Lab 12/24/13 1842  WBC 5.4  NEUTROABS 3.8  HGB 12.9*  HCT 38.2*  MCV 85.5  PLT 175   Cardiac Enzymes: No results found for this basename: CKTOTAL, CKMB, CKMBINDEX, TROPONINI,  in the last 168 hours  BNP (last 3 results) No results found for this basename: PROBNP,  in the last 8760 hours CBG: No results found for this basename: GLUCAP,  in the last 168 hours  Radiological Exams on Admission: No results found.   EKG: Independently reviewed.    Assessment/Plan:   63 y.o. male with  Principal Problem:   1.    Foot infection/Foot ulcer, left/Diabetic foot ulcer   IV Vancomycin   Ortho:  Dr [name] to See in AM     2.    HTN (hypertension)   Continue Lotrel, and HCTZ,    Monitor BPs   IV Hydralazine PRN     3.    Hyperlipidemia   Continue Crestor ( or Equivalent) Rx, Zetia, and Omega 3 Fatty Acids      4.    Hypothyroidism   Continue Levothyroxine       5.    CAD (coronary artery disease)   Continue Carvedilol, and ASA Rx     6     Diabetes  mellitus   Hold Metformin, and Tradjenta   SSI    Check HbA1c in AM     7.    DVT Prophylaxis   Lovenox    Code Status:   FULL CODE Family Communication:    Wife At Bedside Disposition Plan:   Inpatient      Time spent:  Raymond C [facility] Pager [phone]   If [place] Please Contact the Day Rounding Team MD for [facility]  If 7PM-7AM, Please Contact night-coverage  [email or web address] Password [facility] 12/24/2013, 9:28 PM

## 2013-12-24 NOTE — ED Notes (Signed)
Pt presents with c/o left foot wound that has gotten progressively worse over the last week. Pt was sent here from the wound center related to increased swelling and redness and worsening of the sore. Pt has been on antibiotics for this and has already had a culture on this foot. Pt was sent here for IV antibiotics and possible consult with orthopedics.

## 2013-12-25 ENCOUNTER — Encounter (HOSPITAL_COMMUNITY): Payer: Managed Care, Other (non HMO) | Admitting: Anesthesiology

## 2013-12-25 ENCOUNTER — Encounter (HOSPITAL_COMMUNITY)
Admission: EM | Disposition: A | Discharge status: Home Health | Payer: Self-pay | Source: Home / Self Care | Attending: Internal Medicine

## 2013-12-25 ENCOUNTER — Inpatient Hospital Stay (HOSPITAL_COMMUNITY): Payer: Managed Care, Other (non HMO) | Admitting: Anesthesiology

## 2013-12-25 DIAGNOSIS — E7800 Pure hypercholesterolemia, unspecified: Secondary | ICD-10-CM

## 2013-12-25 DIAGNOSIS — E1129 Type 2 diabetes mellitus with other diabetic kidney complication: Secondary | ICD-10-CM

## 2013-12-25 DIAGNOSIS — I10 Essential (primary) hypertension: Secondary | ICD-10-CM

## 2013-12-25 DIAGNOSIS — E038 Other specified hypothyroidism: Secondary | ICD-10-CM

## 2013-12-25 DIAGNOSIS — L089 Local infection of the skin and subcutaneous tissue, unspecified: Secondary | ICD-10-CM

## 2013-12-25 DIAGNOSIS — N183 Chronic kidney disease, stage 3 unspecified: Secondary | ICD-10-CM | POA: Diagnosis present

## 2013-12-25 DIAGNOSIS — N189 Chronic kidney disease, unspecified: Secondary | ICD-10-CM

## 2013-12-25 DIAGNOSIS — L97509 Non-pressure chronic ulcer of other part of unspecified foot with unspecified severity: Secondary | ICD-10-CM

## 2013-12-25 DIAGNOSIS — G589 Mononeuropathy, unspecified: Secondary | ICD-10-CM

## 2013-12-25 HISTORY — PX: AMPUTATION: SHX166

## 2013-12-25 LAB — HEMOGLOBIN A1C
Hgb A1c MFr Bld: 7 % — ABNORMAL HIGH (ref <5.7)
Mean Plasma Glucose: 154 mg/dL — ABNORMAL HIGH (ref <117)

## 2013-12-25 LAB — BASIC METABOLIC PANEL
ANION GAP: 15 (ref 5–15)
BUN: 39 mg/dL — ABNORMAL HIGH (ref 6–23)
CO2: 23 mEq/L (ref 19–32)
CREATININE: 1.95 mg/dL — AB (ref 0.50–1.35)
Calcium: 9.5 mg/dL (ref 8.4–10.5)
Chloride: 100 mEq/L (ref 96–112)
GFR calc Af Amer: 40 mL/min — ABNORMAL LOW (ref >90)
GFR calc non Af Amer: 35 mL/min — ABNORMAL LOW (ref >90)
Glucose, Bld: 126 mg/dL — ABNORMAL HIGH (ref 70–99)
Potassium: 4.4 mEq/L (ref 3.7–5.3)
Sodium: 138 mEq/L (ref 137–147)

## 2013-12-25 LAB — GLUCOSE, CAPILLARY
GLUCOSE-CAPILLARY: 165 mg/dL — AB (ref 70–99)
Glucose-Capillary: 128 mg/dL — ABNORMAL HIGH (ref 70–99)
Glucose-Capillary: 141 mg/dL — ABNORMAL HIGH (ref 70–99)
Glucose-Capillary: 149 mg/dL — ABNORMAL HIGH (ref 70–99)
Glucose-Capillary: 187 mg/dL — ABNORMAL HIGH (ref 70–99)

## 2013-12-25 LAB — CBC
HCT: 40 % (ref 39.0–52.0)
HEMOGLOBIN: 13.1 g/dL (ref 13.0–17.0)
MCH: 28.2 pg (ref 26.0–34.0)
MCHC: 32.8 g/dL (ref 30.0–36.0)
MCV: 86 fL (ref 78.0–100.0)
Platelets: 206 10*3/uL (ref 150–400)
RBC: 4.65 MIL/uL (ref 4.22–5.81)
RDW: 14.5 % (ref 11.5–15.5)
WBC: 7.4 10*3/uL (ref 4.0–10.5)

## 2013-12-25 LAB — SURGICAL PCR SCREEN
MRSA, PCR: POSITIVE — AB
Staphylococcus aureus: POSITIVE — AB

## 2013-12-25 SURGERY — AMPUTATION, FOOT, RAY
Anesthesia: General | Site: Toe | Laterality: Left

## 2013-12-25 MED ORDER — METHOCARBAMOL 1000 MG/10ML IJ SOLN
500.0000 mg | Freq: Four times a day (QID) | INTRAMUSCULAR | Status: DC | PRN
  Filled 2013-12-25: qty 5

## 2013-12-25 MED ORDER — VANCOMYCIN HCL 10 G IV SOLR
1500.0000 mg | INTRAVENOUS | Status: DC
  Administered 2013-12-25 – 2013-12-27 (×3): 1500 mg via INTRAVENOUS
  Filled 2013-12-25 (×3): qty 1500

## 2013-12-25 MED ORDER — FENTANYL CITRATE 0.05 MG/ML IJ SOLN
25.0000 ug | INTRAMUSCULAR | Status: DC | PRN
  Administered 2013-12-25 (×2): 50 ug via INTRAVENOUS

## 2013-12-25 MED ORDER — MUPIROCIN 2 % EX OINT
1.0000 "application " | TOPICAL_OINTMENT | Freq: Two times a day (BID) | CUTANEOUS | Status: DC
  Administered 2013-12-25 – 2013-12-27 (×4): 1 via NASAL
  Filled 2013-12-25: qty 22

## 2013-12-25 MED ORDER — LIDOCAINE HCL (PF) 2 % IJ SOLN
INTRAMUSCULAR | Status: DC | PRN
  Administered 2013-12-25: 75 mg via INTRADERMAL

## 2013-12-25 MED ORDER — ONDANSETRON HCL 4 MG/2ML IJ SOLN: 4.0000 mg | Freq: Four times a day (QID) | INTRAMUSCULAR | Status: DC | PRN

## 2013-12-25 MED ORDER — ONDANSETRON HCL 4 MG/2ML IJ SOLN
INTRAMUSCULAR | Status: AC
  Filled 2013-12-25: qty 2

## 2013-12-25 MED ORDER — OXYCODONE-ACETAMINOPHEN 5-325 MG PO TABS
1.0000 | ORAL_TABLET | ORAL | Status: DC | PRN
  Administered 2013-12-25 – 2013-12-27 (×6): 2 via ORAL
  Filled 2013-12-25 (×6): qty 2

## 2013-12-25 MED ORDER — ONDANSETRON HCL 4 MG PO TABS: 4.0000 mg | ORAL_TABLET | Freq: Four times a day (QID) | ORAL | Status: DC | PRN

## 2013-12-25 MED ORDER — ONDANSETRON HCL 4 MG/2ML IJ SOLN
INTRAMUSCULAR | Status: DC | PRN
  Administered 2013-12-25: 4 mg via INTRAVENOUS

## 2013-12-25 MED ORDER — CHLORHEXIDINE GLUCONATE CLOTH 2 % EX PADS
6.0000 | MEDICATED_PAD | Freq: Every day | CUTANEOUS | Status: DC
  Administered 2013-12-26: 6 via TOPICAL

## 2013-12-25 MED ORDER — FENTANYL CITRATE 0.05 MG/ML IJ SOLN
INTRAMUSCULAR | Status: AC
  Filled 2013-12-25: qty 2

## 2013-12-25 MED ORDER — METOCLOPRAMIDE HCL 5 MG/ML IJ SOLN: 5.0000 mg | Freq: Three times a day (TID) | INTRAMUSCULAR | Status: DC | PRN

## 2013-12-25 MED ORDER — METHOCARBAMOL 500 MG PO TABS
500.0000 mg | ORAL_TABLET | Freq: Four times a day (QID) | ORAL | Status: DC | PRN
  Filled 2013-12-25: qty 1

## 2013-12-25 MED ORDER — PROPOFOL 10 MG/ML IV BOLUS
INTRAVENOUS | Status: DC | PRN
  Administered 2013-12-25: 200 mg via INTRAVENOUS

## 2013-12-25 MED ORDER — PROPOFOL 10 MG/ML IV BOLUS
INTRAVENOUS | Status: AC
  Filled 2013-12-25: qty 20

## 2013-12-25 MED ORDER — LACTATED RINGERS IV SOLN: INTRAVENOUS | Status: DC

## 2013-12-25 MED ORDER — SODIUM CHLORIDE 0.9 % IJ SOLN
INTRAMUSCULAR | Status: AC
  Filled 2013-12-25: qty 10

## 2013-12-25 MED ORDER — FENTANYL CITRATE 0.05 MG/ML IJ SOLN
INTRAMUSCULAR | Status: DC | PRN
  Administered 2013-12-25 (×3): 50 ug via INTRAVENOUS

## 2013-12-25 MED ORDER — EPHEDRINE SULFATE 50 MG/ML IJ SOLN
INTRAMUSCULAR | Status: AC
  Filled 2013-12-25: qty 1

## 2013-12-25 MED ORDER — DOCUSATE SODIUM 100 MG PO CAPS
100.0000 mg | ORAL_CAPSULE | Freq: Two times a day (BID) | ORAL | Status: DC
  Administered 2013-12-25 – 2013-12-26 (×2): 100 mg via ORAL
  Filled 2013-12-25 (×6): qty 1

## 2013-12-25 MED ORDER — LIDOCAINE HCL (CARDIAC) 20 MG/ML IV SOLN
INTRAVENOUS | Status: AC
  Filled 2013-12-25: qty 5

## 2013-12-25 MED ORDER — FENTANYL CITRATE 0.05 MG/ML IJ SOLN
INTRAMUSCULAR | Status: AC
  Filled 2013-12-25: qty 5

## 2013-12-25 MED ORDER — MIDAZOLAM HCL 5 MG/5ML IJ SOLN
INTRAMUSCULAR | Status: DC | PRN
  Administered 2013-12-25: 2 mg via INTRAVENOUS

## 2013-12-25 MED ORDER — 0.9 % SODIUM CHLORIDE (POUR BTL) OPTIME
TOPICAL | Status: DC | PRN
  Administered 2013-12-25: 1000 mL

## 2013-12-25 MED ORDER — MIDAZOLAM HCL 2 MG/2ML IJ SOLN
INTRAMUSCULAR | Status: AC
  Filled 2013-12-25: qty 2

## 2013-12-25 MED ORDER — LACTATED RINGERS IV SOLN
INTRAVENOUS | Status: DC | PRN
  Administered 2013-12-25: 11:00:00 via INTRAVENOUS

## 2013-12-25 MED ORDER — SODIUM CHLORIDE 0.9 % IV SOLN: INTRAVENOUS | Status: DC

## 2013-12-25 MED ORDER — HYDROMORPHONE HCL 1 MG/ML IJ SOLN
0.5000 mg | INTRAMUSCULAR | Status: DC | PRN
  Administered 2013-12-25 – 2013-12-26 (×3): 1 mg via INTRAVENOUS
  Filled 2013-12-25 (×3): qty 1

## 2013-12-25 MED ORDER — METOCLOPRAMIDE HCL 10 MG PO TABS: 5.0000 mg | ORAL_TABLET | Freq: Three times a day (TID) | ORAL | Status: DC | PRN

## 2013-12-25 SURGICAL SUPPLY — 37 items
BAG ZIPLOCK 12X15 (MISCELLANEOUS) ×2 IMPLANT
BANDAGE ESMARK 6X9 LF (GAUZE/BANDAGES/DRESSINGS) ×1 IMPLANT
BLADE OSCILLATING/SAGITTAL (BLADE) ×1
BLADE SW THK.38XMED LNG THN (BLADE) ×1 IMPLANT
BNDG COHESIVE 3X5 TAN STRL LF (GAUZE/BANDAGES/DRESSINGS) ×2 IMPLANT
BNDG COHESIVE 4X5 TAN STRL (GAUZE/BANDAGES/DRESSINGS) ×2 IMPLANT
BNDG COHESIVE 6X5 TAN STRL LF (GAUZE/BANDAGES/DRESSINGS) ×2 IMPLANT
BNDG ESMARK 6X9 LF (GAUZE/BANDAGES/DRESSINGS) ×2
BNDG GAUZE ELAST 4 BULKY (GAUZE/BANDAGES/DRESSINGS) ×2 IMPLANT
CUFF TOURN SGL QUICK 34 (TOURNIQUET CUFF)
CUFF TRNQT CYL 34X4X40X1 (TOURNIQUET CUFF) IMPLANT
DRAPE SHEET LG 3/4 BI-LAMINATE (DRAPES) ×2 IMPLANT
DRAPE SURG 17X11 SM STRL (DRAPES) ×4 IMPLANT
DRAPE U-SHAPE 47X51 STRL (DRAPES) ×4 IMPLANT
DRSG ADAPTIC 3X8 NADH LF (GAUZE/BANDAGES/DRESSINGS) ×2 IMPLANT
DURAPREP 26ML APPLICATOR (WOUND CARE) ×2 IMPLANT
ELECT REM PT RETURN 9FT ADLT (ELECTROSURGICAL) ×2
ELECTRODE REM PT RTRN 9FT ADLT (ELECTROSURGICAL) ×1 IMPLANT
GAUZE SPONGE 4X4 12PLY STRL (GAUZE/BANDAGES/DRESSINGS) ×4 IMPLANT
GLOVE BIOGEL PI IND STRL 8.5 (GLOVE) ×1 IMPLANT
GLOVE BIOGEL PI INDICATOR 8.5 (GLOVE) ×1
GLOVE SURG ORTHO 9.0 STRL STRW (GLOVE) ×2 IMPLANT
GOWN STRL REUS W/ TWL XL LVL3 (GOWN DISPOSABLE) ×1 IMPLANT
GOWN STRL REUS W/TWL XL LVL3 (GOWN DISPOSABLE) ×1
KIT BASIN OR (CUSTOM PROCEDURE TRAY) ×2 IMPLANT
MANIFOLD NEPTUNE II (INSTRUMENTS) ×2 IMPLANT
NS IRRIG 1000ML POUR BTL (IV SOLUTION) ×2 IMPLANT
PACK ORTHO EXTREMITY (CUSTOM PROCEDURE TRAY) ×2 IMPLANT
PAD ABD 8X10 STRL (GAUZE/BANDAGES/DRESSINGS) ×2 IMPLANT
PAD CAST 4YDX4 CTTN HI CHSV (CAST SUPPLIES) IMPLANT
PADDING CAST COTTON 4X4 STRL (CAST SUPPLIES)
POSITIONER SURGICAL ARM (MISCELLANEOUS) ×2 IMPLANT
SCOTCHCAST PLUS 4X4 WHITE (CAST SUPPLIES) ×2 IMPLANT
STOCKINETTE 8 INCH (MISCELLANEOUS) ×2 IMPLANT
SUCTION FRAZIER TIP 10 FR DISP (SUCTIONS) ×2 IMPLANT
SUT ETHILON 2 0 PSLX (SUTURE) ×6 IMPLANT
WATER STERILE IRR 1500ML POUR (IV SOLUTION) IMPLANT

## 2013-12-25 NOTE — Transfer of Care (Signed)
Immediate Anesthesia Transfer of Care Note  Patient: [name]  Procedure(s) Performed: Procedure(s): AMPUTATION RAY LEFT 5TH RAY (Left)  Patient Location: PACU  Anesthesia Type:General  Level of Consciousness: sedated and responds to stimulation  Airway & Oxygen Therapy: Patient Spontanous Breathing and Patient connected to face mask oxygen  Post-op Assessment: Report given to PACU RN and Post -op Vital signs reviewed and stable  Post vital signs: Reviewed and stable  Complications: No apparent anesthesia complications

## 2013-12-25 NOTE — Anesthesia Postprocedure Evaluation (Signed)
  Anesthesia Post-op Note  Patient: Mahkai Pitones  Procedure(s) Performed: Procedure(s) (LRB): AMPUTATION RAY LEFT 5TH RAY (Left)  Patient Location: PACU  Anesthesia Type: General  Level of Consciousness: awake and alert   Airway and Oxygen Therapy: Patient Spontanous Breathing  Post-op Pain: mild  Post-op Assessment: Post-op Vital signs reviewed, Patient's Cardiovascular Status Stable, Respiratory Function Stable, Patent Airway and No signs of Nausea or vomiting  Last Vitals:  Filed Vitals:   12/25/13 1250  BP:   Pulse: 69  Temp:   Resp: 12    Post-op Vital Signs: stable   Complications: No apparent anesthesia complications

## 2013-12-25 NOTE — Progress Notes (Signed)
Pt at about 1650 asked to use the bathrrom. Pt offered urinal but refused. Offered the [facility] but still refused. Pt attempted to use crutches and then put them away saying he did not want to use them. Pt applied wt to left foot and started to bleed after. Pt had mod amt of bleeding on left side of dsg. dsg then reinforced. MD made aware. Pt said he would listen the next time and not apply pressure ot leg. Vital signs wnl. Vwilliams,rn.

## 2013-12-25 NOTE — Discharge Instructions (Signed)
Elevate left lower extremity level with the heart. Nonweightbearing left foot. Keep dressing clean dry and intact for one week.

## 2013-12-25 NOTE — Progress Notes (Addendum)
[facility] PROGRESS NOTE   [name] [id number] DOB: 03-10-1951 DOA: 12/24/2013 PCP: No PCP Per Patient  HPI/Subjective: Seen this morning after he was seen by Dr. [name], he will have surgery later today.  Assessment/Plan: Principal Problem:   Foot infection Active Problems:   Diabetic foot ulcer   HTN (hypertension)   Hyperlipidemia   Hypothyroidism   CAD (coronary artery disease)   Diabetes mellitus   Foot ulcer, left   Foot infection/Foot ulcer, left/Diabetic foot ulcer  -Evaluated by Dr. [name] this morning, patient scheduled for left fifth ray amputation today. -Continue IV antibiotics, after amputation will treat as cellulitis rather than osteomyelitis. -MRI discontinued.  Diabetes mellitus  -Hold metformin and Tradjenta. -Patient said he was diagnosed only for the past one year. -Started on insulin sliding scale and carbohydrate modified diet after surgery. -A1c is 7.0 which correlate with mean plasma glucose of 126 indicating good control.  HTN (hypertension)  -Continue Lotrel, and HCTZ,  -Monitor blood pressure, IV hydralazine as needed for high blood pressure.  Hyperlipidemia  Continue Crestor ( or Equivalent) Rx, Zetia, and Omega 3 Fatty Acids   Hypothyroidism  Continue Levothyroxine   CAD (coronary artery disease)  Continue Carvedilol, and ASA Rx   CK stage III -Baseline creatinine being 1.6 and 2, creatinine is 1.95 today. This is likely secondary to diabetes.   Code Status: Full code Family Communication: Plan discussed with the patient. Disposition Plan: Remains inpatient   Consultants:  Dr. [name]   Procedures:  None  Antibiotics:  None   Objective: Filed Vitals:   12/25/13 1230  BP: 122/74  Pulse: 66  Temp:   Resp: 12    Intake/Output Summary (Last 24 hours) at 12/25/13 1249 Last data filed at 12/25/13 1218  Gross per 24 hour  Intake   1297 ml  Output   1930 ml  Net   -633 ml   Filed Weights   12/24/13 2100    Weight: 105.96 kg (233 lb 9.6 oz)    Exam: General: Alert and awake, oriented x3, not in any acute distress. HEENT: anicteric sclera, pupils reactive to light and accommodation, EOMI CVS: S1-S2 clear, no murmur rubs or gallops Chest: clear to auscultation bilaterally, no wheezing, rales or rhonchi Abdomen: soft nontender, nondistended, normal bowel sounds, no organomegaly Extremities: no cyanosis, clubbing or edema noted bilaterally Neuro: Cranial nerves II-XII intact, no focal neurological deficits  Data Reviewed: Basic Metabolic Panel:  Recent Labs Lab 12/24/13 1842 12/25/13 0525  NA 138 138  K 4.5 4.4  CL 100 100  CO2 22 23  GLUCOSE 155* 126*  BUN 48* 39*  CREATININE 2.25* 1.95*  CALCIUM 9.2 9.5   Liver Function Tests: No results found for this basename: AST, ALT, ALKPHOS, BILITOT, PROT, ALBUMIN,  in the last 168 hours No results found for this basename: LIPASE, AMYLASE,  in the last 168 hours No results found for this basename: AMMONIA,  in the last 168 hours CBC:  Recent Labs Lab 12/24/13 1842 12/25/13 0525  WBC 5.4 7.4  NEUTROABS 3.8  --   HGB 12.9* 13.1  HCT 38.2* 40.0  MCV 85.5 86.0  PLT 175 206   Cardiac Enzymes: No results found for this basename: CKTOTAL, CKMB, CKMBINDEX, TROPONINI,  in the last 168 hours BNP (last 3 results) No results found for this basename: PROBNP,  in the last 8760 hours CBG:  Recent Labs Lab 12/24/13 2214 12/25/13 0732 12/25/13 1217  GLUCAP 128* 165* 128*    Micro Recent  Results (from the past 240 hour(s))  SURGICAL PCR SCREEN     Status: Abnormal   Collection Time    12/25/13  9:47 AM      Result Value Ref Range Status   MRSA, PCR POSITIVE (*) NEGATIVE Final   Comment: RESULT CALLED TO, READ BACK BY AND VERIFIED WITH:     WILLIAMS, V. AT 1217 ON 11/24/13 BY HOBBINS, J.   Staphylococcus aureus POSITIVE (*) NEGATIVE Final   Comment:            The Xpert SA Assay (FDA     approved for NASAL specimens     in  patients over 26 years of age),     is one component of     a comprehensive surveillance     program.  Test performance has     been validated by [employer] for patients greater     than or equal to 32 year old.     It is not intended     to diagnose infection nor to     guide or monitor treatment.     RESULT CALLED TO, READ BACK BY AND VERIFIED WITH:     WILLIAMS, V. AT 1217 ON 11/24/13 BY HOBBINS, J.     Studies: No results found.  Scheduled Meds: . allopurinol  300 mg Oral Daily  . amLODipine  5 mg Oral Daily  . aspirin EC  81 mg Oral Daily  . atorvastatin  40 mg Oral q1800  . benazepril  10 mg Oral Daily  . carvedilol  3.125 mg Oral BID WC  . enoxaparin (LOVENOX) injection  40 mg Subcutaneous QHS  . ezetimibe  10 mg Oral Daily  . FLORA-Q  1 capsule Oral Daily  . gabapentin  1,200 mg Oral TID  . hydrochlorothiazide  25 mg Oral Daily  . insulin aspart  0-5 Units Subcutaneous QHS  . insulin aspart  0-9 Units Subcutaneous TID WC  . levothyroxine  88 mcg Oral QAC breakfast  . linagliptin  5 mg Oral Daily  . magnesium oxide  200 mg Oral Daily  . omega-3 acid ethyl esters  1 g Oral BID  . pantoprazole  40 mg Oral Daily  . vancomycin  1,500 mg Intravenous Q24H   Continuous Infusions: . sodium chloride 20 mL/hr at 12/24/13 1911  . sodium chloride 50 mL/hr at 12/24/13 2216  . sodium chloride    . lactated ringers         Time spent: 35 minutes    [facility] A  [facility] Pager [phone] If 7PM-7AM, please contact night-coverage at [email or web address], password [facility] 12/25/2013, 12:49 PM  LOS: 1 day

## 2013-12-25 NOTE — Consult Note (Signed)
Reason for Consult: Osteomyelitis ulceration cellulitis left foot fifth metatarsal Referring Physician: Dr. Grier Mitts is an 63 y.o. male.  HPI: Patient is a 63 year old gentleman diabetic insensate neuropathy with over a year and a half ulceration over the fifth metatarsal head left foot. Patient also has a history of gout. Patient is been going to the [place] [facility] and was referred from the wound Center last night to the hospital.  Past Medical History  Diagnosis Date  . Coronary artery disease   . Hypertension   . Hypercholesteremia   . Neuropathy   . Diabetes mellitus without complication     Past Surgical History  Procedure Laterality Date  . Coronary artery bypass graft    . Cholecystectomy    . Back surgery      Family History  Problem Relation Age of Onset  . Diabetes Mellitus II Mother   . Diabetes Mellitus II Father   . CAD Father     Social History:  reports that he quit smoking about 25 years ago. His smoking use included Cigars. He has never used smokeless tobacco. He reports that he does not drink alcohol or use illicit drugs.  Allergies:  Allergies  Allergen Reactions  . Mushroom Extract Complex Nausea Only    Medications: I have reviewed the patient's current medications.  Results for orders placed during the hospital encounter of 12/24/13 (from the past 48 hour(s))  CBC WITH DIFFERENTIAL     Status: Abnormal   Collection Time    12/24/13  6:42 PM      Result Value Ref Range   WBC 5.4  4.0 - 10.5 K/uL   RBC 4.47  4.22 - 5.81 MIL/uL   Hemoglobin 12.9 (*) 13.0 - 17.0 g/dL   HCT 38.2 (*) 39.0 - 52.0 %   MCV 85.5  78.0 - 100.0 fL   MCH 28.9  26.0 - 34.0 pg   MCHC 33.8  30.0 - 36.0 g/dL   RDW 14.3  11.5 - 15.5 %   Platelets 175  150 - 400 K/uL   Neutrophils Relative % 69  43 - 77 %   Neutro Abs 3.8  1.7 - 7.7 K/uL   Lymphocytes Relative 18  12 - 46 %   Lymphs Abs 1.0  0.7 - 4.0 K/uL   Monocytes Relative 8  3 - 12 %   Monocytes  Absolute 0.4  0.1 - 1.0 K/uL   Eosinophils Relative 4  0 - 5 %   Eosinophils Absolute 0.2  0.0 - 0.7 K/uL   Basophils Relative 1  0 - 1 %   Basophils Absolute 0.0  0.0 - 0.1 K/uL  BASIC METABOLIC PANEL     Status: Abnormal   Collection Time    12/24/13  6:42 PM      Result Value Ref Range   Sodium 138  137 - 147 mEq/L   Potassium 4.5  3.7 - 5.3 mEq/L   Chloride 100  96 - 112 mEq/L   CO2 22  19 - 32 mEq/L   Glucose, Bld 155 (*) 70 - 99 mg/dL   BUN 48 (*) 6 - 23 mg/dL   Creatinine, Ser 2.25 (*) 0.50 - 1.35 mg/dL   Calcium 9.2  8.4 - 10.5 mg/dL   GFR calc non Af Amer 29 (*) >90 mL/min   GFR calc Af Amer 34 (*) >90 mL/min   Comment: (NOTE)     The eGFR has been calculated using the CKD  EPI equation.     This calculation has not been validated in all clinical situations.     eGFR's persistently <90 mL/min signify possible Chronic Kidney     Disease.   Anion gap 16 (*) 5 - 15  GLUCOSE, CAPILLARY     Status: Abnormal   Collection Time    12/24/13 10:14 PM      Result Value Ref Range   Glucose-Capillary 128 (*) 70 - 99 mg/dL   Comment 1 Notify RN    BASIC METABOLIC PANEL     Status: Abnormal   Collection Time    12/25/13  5:25 AM      Result Value Ref Range   Sodium 138  137 - 147 mEq/L   Potassium 4.4  3.7 - 5.3 mEq/L   Chloride 100  96 - 112 mEq/L   CO2 23  19 - 32 mEq/L   Glucose, Bld 126 (*) 70 - 99 mg/dL   BUN 39 (*) 6 - 23 mg/dL   Creatinine, Ser 1.95 (*) 0.50 - 1.35 mg/dL   Calcium 9.5  8.4 - 10.5 mg/dL   GFR calc non Af Amer 35 (*) >90 mL/min   GFR calc Af Amer 40 (*) >90 mL/min   Comment: (NOTE)     The eGFR has been calculated using the CKD EPI equation.     This calculation has not been validated in all clinical situations.     eGFR's persistently <90 mL/min signify possible Chronic Kidney     Disease.   Anion gap 15  5 - 15  CBC     Status: None   Collection Time    12/25/13  5:25 AM      Result Value Ref Range   WBC 7.4  4.0 - 10.5 K/uL   RBC 4.65  4.22 -  5.81 MIL/uL   Hemoglobin 13.1  13.0 - 17.0 g/dL   HCT 40.0  39.0 - 52.0 %   MCV 86.0  78.0 - 100.0 fL   MCH 28.2  26.0 - 34.0 pg   MCHC 32.8  30.0 - 36.0 g/dL   RDW 14.5  11.5 - 15.5 %   Platelets 206  150 - 400 K/uL    No results found.  Review of Systems  All other systems reviewed and are negative.  Blood pressure 148/87, pulse 76, temperature 97.2 F (36.2 C), temperature source Oral, resp. rate 18, height '5\' 11"'  (1.803 m), weight 105.96 kg (233 lb 9.6 oz), SpO2 97.00%. Physical Exam On examination patient has a strong dorsalis pedis pulse on the left. He has cellulitis involving half of his foot. There is no fluctuance no signs of abscess. He has a large chronic ulcer over the lateral border of the fifth metatarsal head. The bone is palpable within the wound. There is no drainage. There are no tophaceous gouty changes. Patient is heel cord contracture with dorsiflexion of the ankle less than neutral. Assessment/Plan: Assessment: Chronic ulceration with osteomyelitis and cellulitis left foot fifth metatarsal head.  Plan: Will plan for a left foot fifth ray amputation. Risks and benefits were discussed including persistent infection nonhealing of the wound need for additional surgery. Patient states he understands and wished to proceed at this time. Anticipate patient could be discharged to home on Monday. Continue IV antibiotics through Sunday.  Kerryann Allaire V 12/25/2013, 7:09 AM

## 2013-12-25 NOTE — Progress Notes (Signed)
ANTIBIOTIC CONSULT NOTE - INITIAL  Pharmacy Consult for vancomycin Indication: diabetic foot ulcer, r/o osteo  Allergies  Allergen Reactions  . Mushroom Extract Complex Nausea Only    Patient Measurements: Height: 5\' 11"  (180.3 cm) Weight: 233 lb 9.6 oz (105.96 kg) IBW/kg (Calculated) : 75.3 Adjusted Body Weight:   Vital Signs: Temp: 97.2 F (36.2 C) (09/19 0514) Temp src: Oral (09/19 0514) BP: 148/87 mmHg (09/19 0514) Pulse Rate: 76 (09/19 0514) Intake/Output from previous day: 09/18 0701 - 09/19 0700 In: 547 [I.V.:347; IV Piggyback:200] Out: 1080 [Urine:1080] Intake/Output from this shift: Total I/O In: 547 [I.V.:347; IV Piggyback:200] Out: 1080 [Urine:1080]  Labs:  Recent Labs  12/24/13 1842 12/25/13 0525  WBC 5.4 7.4  HGB 12.9* 13.1  PLT 175 206  CREATININE 2.25*  --    Estimated Creatinine Clearance: 41.1 ml/min (by C-G formula based on Cr of 2.25). No results found for this basename: VANCOTROUGH, VANCOPEAK, VANCORANDOM, GENTTROUGH, GENTPEAK, GENTRANDOM, TOBRATROUGH, TOBRAPEAK, TOBRARND, AMIKACINPEAK, AMIKACINTROU, AMIKACIN,  in the last 72 hours   Microbiology: No results found for this or any previous visit (from the past 720 hour(s)).  Medical History: Past Medical History  Diagnosis Date  . Coronary artery disease   . Hypertension   . Hypercholesteremia   . Neuropathy   . Diabetes mellitus without complication     Medications:  Anti-infectives   Start     Dose/Rate Route Frequency Ordered Stop   12/25/13 0800  vancomycin (VANCOCIN) 1,500 mg in sodium chloride 0.9 % 500 mL IVPB     1,500 mg 250 mL/hr over 120 Minutes Intravenous Every 24 hours 12/25/13 0617     12/24/13 1845  vancomycin (VANCOCIN) IVPB 1000 mg/200 mL premix     1,000 mg 200 mL/hr over 60 Minutes Intravenous  Once 12/24/13 1837 12/24/13 2024     Assessment: Patient with diabetic foot ulcer, First dose of antibiotics already given.    Goal of Therapy:  Vancomycin trough  level 15-20 mcg/ml  Plan:  Measure antibiotic drug levels at steady state Follow up culture results vancomycin 1500mg  iv q24hr  Nani Skillern Crowford 12/25/2013,6:24 AM

## 2013-12-25 NOTE — Consult Note (Deleted)
Reason for Consult: Left intertrochanteric hip fracture and left olecranon fracture Referring Physician: Dr. Grier Mitts is an 63 y.o. male.  HPI: Patient is a 63 year old woman who lives alone states that she had a mechanical fall sustaining a fracture to the left elbow and left hip.  Past Medical History  Diagnosis Date  . Coronary artery disease   . Hypertension   . Hypercholesteremia   . Neuropathy   . Diabetes mellitus without complication     Past Surgical History  Procedure Laterality Date  . Coronary artery bypass graft    . Cholecystectomy    . Back surgery      Family History  Problem Relation Age of Onset  . Diabetes Mellitus II Mother   . Diabetes Mellitus II Father   . CAD Father     Social History:  reports that he quit smoking about 25 years ago. His smoking use included Cigars. He has never used smokeless tobacco. He reports that he does not drink alcohol or use illicit drugs.  Allergies:  Allergies  Allergen Reactions  . Mushroom Extract Complex Nausea Only    Medications: I have reviewed the patient's current medications.  Results for orders placed during the hospital encounter of 12/24/13 (from the past 48 hour(s))  CBC WITH DIFFERENTIAL     Status: Abnormal   Collection Time    12/24/13  6:42 PM      Result Value Ref Range   WBC 5.4  4.0 - 10.5 K/uL   RBC 4.47  4.22 - 5.81 MIL/uL   Hemoglobin 12.9 (*) 13.0 - 17.0 g/dL   HCT 38.2 (*) 39.0 - 52.0 %   MCV 85.5  78.0 - 100.0 fL   MCH 28.9  26.0 - 34.0 pg   MCHC 33.8  30.0 - 36.0 g/dL   RDW 14.3  11.5 - 15.5 %   Platelets 175  150 - 400 K/uL   Neutrophils Relative % 69  43 - 77 %   Neutro Abs 3.8  1.7 - 7.7 K/uL   Lymphocytes Relative 18  12 - 46 %   Lymphs Abs 1.0  0.7 - 4.0 K/uL   Monocytes Relative 8  3 - 12 %   Monocytes Absolute 0.4  0.1 - 1.0 K/uL   Eosinophils Relative 4  0 - 5 %   Eosinophils Absolute 0.2  0.0 - 0.7 K/uL   Basophils Relative 1  0 - 1 %   Basophils Absolute  0.0  0.0 - 0.1 K/uL  BASIC METABOLIC PANEL     Status: Abnormal   Collection Time    12/24/13  6:42 PM      Result Value Ref Range   Sodium 138  137 - 147 mEq/L   Potassium 4.5  3.7 - 5.3 mEq/L   Chloride 100  96 - 112 mEq/L   CO2 22  19 - 32 mEq/L   Glucose, Bld 155 (*) 70 - 99 mg/dL   BUN 48 (*) 6 - 23 mg/dL   Creatinine, Ser 2.25 (*) 0.50 - 1.35 mg/dL   Calcium 9.2  8.4 - 10.5 mg/dL   GFR calc non Af Amer 29 (*) >90 mL/min   GFR calc Af Amer 34 (*) >90 mL/min   Comment: (NOTE)     The eGFR has been calculated using the CKD EPI equation.     This calculation has not been validated in all clinical situations.     eGFR's persistently <90 mL/min signify possible  Chronic Kidney     Disease.   Anion gap 16 (*) 5 - 15  GLUCOSE, CAPILLARY     Status: Abnormal   Collection Time    12/24/13 10:14 PM      Result Value Ref Range   Glucose-Capillary 128 (*) 70 - 99 mg/dL   Comment 1 Notify RN    BASIC METABOLIC PANEL     Status: Abnormal   Collection Time    12/25/13  5:25 AM      Result Value Ref Range   Sodium 138  137 - 147 mEq/L   Potassium 4.4  3.7 - 5.3 mEq/L   Chloride 100  96 - 112 mEq/L   CO2 23  19 - 32 mEq/L   Glucose, Bld 126 (*) 70 - 99 mg/dL   BUN 39 (*) 6 - 23 mg/dL   Creatinine, Ser 1.95 (*) 0.50 - 1.35 mg/dL   Calcium 9.5  8.4 - 10.5 mg/dL   GFR calc non Af Amer 35 (*) >90 mL/min   GFR calc Af Amer 40 (*) >90 mL/min   Comment: (NOTE)     The eGFR has been calculated using the CKD EPI equation.     This calculation has not been validated in all clinical situations.     eGFR's persistently <90 mL/min signify possible Chronic Kidney     Disease.   Anion gap 15  5 - 15  CBC     Status: None   Collection Time    12/25/13  5:25 AM      Result Value Ref Range   WBC 7.4  4.0 - 10.5 K/uL   RBC 4.65  4.22 - 5.81 MIL/uL   Hemoglobin 13.1  13.0 - 17.0 g/dL   HCT 40.0  39.0 - 52.0 %   MCV 86.0  78.0 - 100.0 fL   MCH 28.2  26.0 - 34.0 pg   MCHC 32.8  30.0 - 36.0 g/dL    RDW 14.5  11.5 - 15.5 %   Platelets 206  150 - 400 K/uL    No results found.  Review of Systems  All other systems reviewed and are negative.  Blood pressure 148/87, pulse 76, temperature 97.2 F (36.2 C), temperature source Oral, resp. rate 18, height '5\' 11"'  (1.803 m), weight 105.96 kg (233 lb 9.6 oz), SpO2 97.00%. Physical Exam On examination patient's left upper extremity is splinted. Radiographs shows a nondisplaced fracture of the olecranon of the left elbow. Examination of her left lower extremity her leg is shortened and externally rotated. Radiographs show a intertrochanteric hip fracture with comminution. Assessment/Plan: Assessment: #1 left olecranon fracture. #2 left intertrochanteric hip fracture.  Plan: Will plan to treat the left elbow fracture nonoperatively. The fracture is nondisplaced. Will plan for intramedullary nail fixation for the left hip. Risks and benefits were discussed including infection neurovascular injury pain DVT need for additional surgery. Patient states she understands and wished to proceed at this time patient does live alone she is recently widowed and will need skilled nursing for discharge.  Saniya Tranchina V 12/25/2013, 6:33 AM

## 2013-12-25 NOTE — Op Note (Signed)
12/24/2013 - 12/25/2013  12:12 PM  PATIENT:  [name]    PRE-OPERATIVE DIAGNOSIS:  left fifth ray amputation  POST-OPERATIVE DIAGNOSIS:  Same  PROCEDURE:  AMPUTATION RAY LEFT 5TH RAY Local tissue rearrangement for wound closure 3 x 7 cm  SURGEON:  [name], MD  PHYSICIAN ASSISTANT:None ANESTHESIA:   General  PREOPERATIVE INDICATIONS:  [name] is a  63 y.o. male with a diagnosis of left fifth ray amputation who failed conservative measures and elected for surgical management.    The risks benefits and alternatives were discussed with the patient preoperatively including but not limited to the risks of infection, bleeding, nerve injury, cardiopulmonary complications, the need for revision surgery, among others, and the patient was willing to proceed.  OPERATIVE IMPLANTS: None  OPERATIVE FINDINGS: No deep abscess with good petechial bleeding  OPERATIVE PROCEDURE: Patient is a 63 year old gentleman with a 1-1/2 year history of ulceration on the left foot fifth metatarsal head with diabetic insensate neuropathy. Patient is been treated conservatively at the wound center and presents at this time for surgical intervention. Patient has exposed bone with a chronic ulceration and cellulitis. Risks and benefits of surgery were discussed or a fifth ray amputation. Patient states he understands was to proceed at this time the importance of nonweightbearing to promote wound healing was discussed.  Patient was brought to the operating room and underwent a general anesthetic. After adequate levels of anesthesia were obtained patient's left lower extremity was prepped using DuraPrep draped into a sterile field. A timeout was called. An elliptical incision was made around the ulcer and toe 3 second metatarsal and ulcer in one block of tissue. The metatarsal was resected proximally. The wounds irrigated with normal saline. There was good petechial bleeding. There is no signs of abscess or necrotic  tissue. Local tissue rearrangement was performed to close the wound 7 x 3 cm. 2-0 nylon was used to close the wound. Sterile compressive dressing was applied. Patient was extubated taken to the PACU in stable condition.

## 2013-12-25 NOTE — Progress Notes (Signed)
Call received from lab with positive MRSA pcr result. Pt made aware and education provided. Pt also placed on contact precaution and standing orders initiated. Vw, rn.

## 2013-12-25 NOTE — Anesthesia Preprocedure Evaluation (Signed)
Anesthesia Evaluation  Patient identified by MRN, date of birth, ID band Patient awake    Reviewed: Allergy & Precautions, H&P , NPO status , Patient's Chart, lab work & pertinent test results, reviewed documented beta blocker date and time   Airway Mallampati: II TM Distance: >3 FB Neck ROM: full    Dental  (+) Caps, Dental Advisory Given All upper front capped:   Pulmonary neg pulmonary ROS, former smoker,  breath sounds clear to auscultation  Pulmonary exam normal       Cardiovascular Exercise Tolerance: Good hypertension, Pt. on home beta blockers and Pt. on medications + CAD Rhythm:regular Rate:Normal     Neuro/Psych negative neurological ROS  negative psych ROS   GI/Hepatic negative GI ROS, Neg liver ROS,   Endo/Other  diabetes, Type 2, Oral Hypoglycemic AgentsHypothyroidism   Renal/GU negative Renal ROS  negative genitourinary   Musculoskeletal   Abdominal   Peds  Hematology negative hematology ROS (+)   Anesthesia Other Findings   Reproductive/Obstetrics negative OB ROS                           Anesthesia Physical Anesthesia Plan  ASA: III  Anesthesia Plan: General   Post-op Pain Management:    Induction: Intravenous  Airway Management Planned: LMA  Additional Equipment:   Intra-op Plan:   Post-operative Plan:   Informed Consent: I have reviewed the patients History and Physical, chart, labs and discussed the procedure including the risks, benefits and alternatives for the proposed anesthesia with the patient or authorized representative who has indicated his/her understanding and acceptance.   Dental Advisory Given  Plan Discussed with: CRNA and Surgeon  Anesthesia Plan Comments:         Anesthesia Quick Evaluation

## 2013-12-26 DIAGNOSIS — N183 Chronic kidney disease, stage 3 unspecified: Secondary | ICD-10-CM

## 2013-12-26 DIAGNOSIS — E1169 Type 2 diabetes mellitus with other specified complication: Secondary | ICD-10-CM

## 2013-12-26 LAB — BASIC METABOLIC PANEL
Anion gap: 13 (ref 5–15)
BUN: 28 mg/dL — AB (ref 6–23)
CO2: 25 meq/L (ref 19–32)
CREATININE: 1.69 mg/dL — AB (ref 0.50–1.35)
Calcium: 8.7 mg/dL (ref 8.4–10.5)
Chloride: 99 mEq/L (ref 96–112)
GFR calc Af Amer: 48 mL/min — ABNORMAL LOW (ref >90)
GFR calc non Af Amer: 41 mL/min — ABNORMAL LOW (ref >90)
GLUCOSE: 182 mg/dL — AB (ref 70–99)
Potassium: 4.1 mEq/L (ref 3.7–5.3)
Sodium: 137 mEq/L (ref 137–147)

## 2013-12-26 LAB — GLUCOSE, CAPILLARY
GLUCOSE-CAPILLARY: 205 mg/dL — AB (ref 70–99)
GLUCOSE-CAPILLARY: 166 mg/dL — AB (ref 70–99)
Glucose-Capillary: 146 mg/dL — ABNORMAL HIGH (ref 70–99)
Glucose-Capillary: 183 mg/dL — ABNORMAL HIGH (ref 70–99)

## 2013-12-26 MED ORDER — METFORMIN HCL 500 MG PO TABS
1000.0000 mg | ORAL_TABLET | Freq: Two times a day (BID) | ORAL | Status: DC
  Administered 2013-12-26 – 2013-12-27 (×2): 1000 mg via ORAL
  Filled 2013-12-26 (×4): qty 2

## 2013-12-26 NOTE — Progress Notes (Signed)
[facility] PROGRESS NOTE   [name] [id number] DOB: 1951/03/31 DOA: 12/24/2013 PCP: No PCP Per Patient  HPI/Subjective: Patient tried to walk to the bathroom yesterday and he had incident of bleeding from the surgery site. Both me and Dr. [name] reinforced non-weightbearing status for now.  Assessment/Plan: Principal Problem:   Foot infection Active Problems:   Diabetic foot ulcer   HTN (hypertension)   Hyperlipidemia   Hypothyroidism   CAD (coronary artery disease)   Diabetes mellitus   Foot ulcer, left   CKD (chronic kidney disease), stage III   Foot infection/Foot ulcer, left/Diabetic foot ulcer  -Evaluated by Dr. [name] this morning, patient scheduled for left fifth ray amputation today. -Continue IV antibiotics, after amputation will treat as cellulitis rather than osteomyelitis. -Likely will be discharged in the morning, on oral antibiotics.  Diabetes mellitus  -Restart her metformin and Tradjenta. -Patient said he was diagnosed only for the past one year. -Started on insulin sliding scale and carbohydrate modified diet after surgery. -A1c is 7.0 which correlate with mean plasma glucose of 126 indicating good control.  HTN (hypertension)  -Continue Lotrel, and HCTZ,  -Monitor blood pressure, IV hydralazine as needed for high blood pressure.  Hyperlipidemia  Continue Crestor ( or Equivalent) Rx, Zetia, and Omega 3 Fatty Acids   Hypothyroidism  Continue Levothyroxine   CAD (coronary artery disease)  Continue Carvedilol, and ASA Rx   CK stage III -Baseline creatinine being 1.6 and 2, creatinine is 1.95 today. This is likely secondary to diabetes.   Code Status: Full code Family Communication: Plan discussed with the patient. Disposition Plan: Remains inpatient   Consultants:  Dr. [name]   Procedures:  None  Antibiotics:  None   Objective: Filed Vitals:   12/26/13 1429  BP: 132/60  Pulse: 74  Temp: 98.1 F (36.7 C)  Resp: 18     Intake/Output Summary (Last 24 hours) at 12/26/13 1437 Last data filed at 12/26/13 1013  Gross per 24 hour  Intake 697.33 ml  Output   2270 ml  Net -1572.67 ml   Filed Weights   12/24/13 2100  Weight: 105.96 kg (233 lb 9.6 oz)    Exam: General: Alert and awake, oriented x3, not in any acute distress. HEENT: anicteric sclera, pupils reactive to light and accommodation, EOMI CVS: S1-S2 clear, no murmur rubs or gallops Chest: clear to auscultation bilaterally, no wheezing, rales or rhonchi Abdomen: soft nontender, nondistended, normal bowel sounds, no organomegaly Extremities: no cyanosis, clubbing or edema noted bilaterally Neuro: Cranial nerves II-XII intact, no focal neurological deficits  Data Reviewed: Basic Metabolic Panel:  Recent Labs Lab 12/24/13 1842 12/25/13 0525 12/26/13 0806  NA 138 138 137  K 4.5 4.4 4.1  CL 100 100 99  CO2 22 23 25   GLUCOSE 155* 126* 182*  BUN 48* 39* 28*  CREATININE 2.25* 1.95* 1.69*  CALCIUM 9.2 9.5 8.7   Liver Function Tests: No results found for this basename: AST, ALT, ALKPHOS, BILITOT, PROT, ALBUMIN,  in the last 168 hours No results found for this basename: LIPASE, AMYLASE,  in the last 168 hours No results found for this basename: AMMONIA,  in the last 168 hours CBC:  Recent Labs Lab 12/24/13 1842 12/25/13 0525  WBC 5.4 7.4  NEUTROABS 3.8  --   HGB 12.9* 13.1  HCT 38.2* 40.0  MCV 85.5 86.0  PLT 175 206   Cardiac Enzymes: No results found for this basename: CKTOTAL, CKMB, CKMBINDEX, TROPONINI,  in the last 168 hours BNP (  last 3 results) No results found for this basename: PROBNP,  in the last 8760 hours CBG:  Recent Labs Lab 12/25/13 1217 12/25/13 1709 12/25/13 2102 12/26/13 0736 12/26/13 1208  GLUCAP 128* 149* 187* 146* 183*    Micro Recent Results (from the past 240 hour(s))  SURGICAL PCR SCREEN     Status: Abnormal   Collection Time    12/25/13  9:47 AM      Result Value Ref Range Status   MRSA,  PCR POSITIVE (*) NEGATIVE Final   Comment: RESULT CALLED TO, READ BACK BY AND VERIFIED WITH:     WILLIAMS, V. AT 1217 ON 11/24/13 BY HOBBINS, J.   Staphylococcus aureus POSITIVE (*) NEGATIVE Final   Comment:            The Xpert SA Assay (FDA     approved for NASAL specimens     in patients over 42 years of age),     is one component of     a comprehensive surveillance     program.  Test performance has     been validated by [employer] for patients greater     than or equal to 39 year old.     It is not intended     to diagnose infection nor to     guide or monitor treatment.     RESULT CALLED TO, READ BACK BY AND VERIFIED WITH:     WILLIAMS, V. AT 1217 ON 11/24/13 BY HOBBINS, J.     Studies: No results found.  Scheduled Meds: . allopurinol  300 mg Oral Daily  . amLODipine  5 mg Oral Daily  . aspirin EC  81 mg Oral Daily  . atorvastatin  40 mg Oral q1800  . benazepril  10 mg Oral Daily  . carvedilol  3.125 mg Oral BID WC  . Chlorhexidine Gluconate Cloth  6 each Topical Q0600  . docusate sodium  100 mg Oral BID  . enoxaparin (LOVENOX) injection  40 mg Subcutaneous QHS  . ezetimibe  10 mg Oral Daily  . FLORA-Q  1 capsule Oral Daily  . gabapentin  1,200 mg Oral TID  . hydrochlorothiazide  25 mg Oral Daily  . insulin aspart  0-5 Units Subcutaneous QHS  . insulin aspart  0-9 Units Subcutaneous TID WC  . levothyroxine  88 mcg Oral QAC breakfast  . linagliptin  5 mg Oral Daily  . magnesium oxide  200 mg Oral Daily  . mupirocin ointment  1 application Nasal BID  . omega-3 acid ethyl esters  1 g Oral BID  . pantoprazole  40 mg Oral Daily  . vancomycin  1,500 mg Intravenous Q24H   Continuous Infusions: . sodium chloride 20 mL/hr at 12/24/13 1911       Time spent: 35 minutes    [facility]  [facility] Pager [phone] If 7PM-7AM, please contact night-coverage at [email or web address], password [facility] 12/26/2013, 2:37 PM  LOS: 2 days

## 2013-12-26 NOTE — Evaluation (Signed)
Physical Therapy Evaluation Patient Details Name: [name] MRN: [id number] DOB: 07-02-1950 Today's Date: 12/26/2013   History of Present Illness  R fifth ray amputation; hx peripheral neuropathy affecting balance  Clinical Impression  Pt s/p L foot 5th ray amputation presents with functional mobility limitations 2* NWB status on L LE and balance deficits 2* peripheral neuropathy.  Pt mobilizing this date with min assist and use of RW and knee scooter.  Pt plans d/c home with family assist.    Follow Up Recommendations No PT follow up    Equipment Recommendations  Rolling walker with 5" wheels;3in1 (PT)    Recommendations for Other Services OT consult     Precautions / Restrictions Precautions Precautions: Fall Restrictions Weight Bearing Restrictions: Yes LLE Weight Bearing: Non weight bearing      Mobility  Bed Mobility Overal bed mobility: Modified Independent                Transfers Overall transfer level: Needs assistance Equipment used: Rolling walker (2 wheeled) Transfers: Sit to/from Stand Sit to Stand: Min guard         General transfer comment: cues for LE management and use of UEs to self assist  Ambulation/Gait Ambulation/Gait assistance: Min assist Ambulation Distance (Feet): 25 Feet Assistive device: Rolling walker (2 wheeled) Gait Pattern/deviations: Step-to pattern;Decreased step length - right;Decreased step length - left;Shuffle;Trunk flexed Gait velocity: decr   General Gait Details: cues for posture, position from RW and L LE management; assist for stability  Stairs            Wheelchair Mobility    Modified Rankin (Stroke Patients Only)       Balance Overall balance assessment: Needs assistance Sitting-balance support: Feet supported;No upper extremity supported Sitting balance-Leahy Scale: Normal     Standing balance support: Bilateral upper extremity supported Standing balance-Leahy Scale: Fair Standing balance  comment: Pt balance ltd prior to admit 2* periph neuropathy.  Pt unable to balance on R LE without assist of RW                             Pertinent Vitals/Pain Pain Assessment: 0-10 Pain Score: 3  Pain Location: L foot Pain Intervention(s): Limited activity within patient's tolerance;Monitored during session;Premedicated before session    Home Living Family/patient expects to be discharged to:: Private residence Living Arrangements: Spouse/significant other Available Help at Discharge: Family Type of Home: House Home Access: Stairs to enter Entrance Stairs-Rails: None Entrance Stairs-Number of Steps: 1 Home Layout: Able to live on main level with bedroom/bathroom Home Equipment: Crutches Additional Comments: Pt attempted crutches yesterday without assist and too unstable to ambulate    Prior Function Level of Independence: Independent         Comments: Pt states very active     Hand Dominance        Extremity/Trunk Assessment   Upper Extremity Assessment: Overall WFL for tasks assessed           Lower Extremity Assessment: LLE deficits/detail   LLE Deficits / Details: ROM/strength WFL - dressings in place L foot  Cervical / Trunk Assessment: Normal  Communication   Communication: No difficulties  Cognition Arousal/Alertness: Awake/alert Behavior During Therapy: WFL for tasks assessed/performed Overall Cognitive Status: Within Functional Limits for tasks assessed                      General Comments General comments (skin integrity, edema, etc.): Pt mobilized  150' on knee scooter with min assist and multiple rests 2* L calf cramping.  Cues required for knee placement, use of hand brakes and 3 pt turning    Exercises        Assessment/Plan    PT Assessment Patient needs continued PT services  PT Diagnosis     PT Problem List Decreased activity tolerance;Decreased balance;Decreased mobility;Decreased knowledge of use of  DME;Decreased safety awareness;Obesity;Pain  PT Treatment Interventions DME instruction;Gait training;Stair training;Functional mobility training;Therapeutic activities;Therapeutic exercise;Patient/family education   PT Goals (Current goals can be found in the Care Plan section) Acute Rehab PT Goals Patient Stated Goal: Resume previous active lifestyle asap PT Goal Formulation: With patient Time For Goal Achievement: 01/02/14 Potential to Achieve Goals: Good    Frequency Min 6X/week   Barriers to discharge        Co-evaluation               End of Session Equipment Utilized During Treatment: Gait belt Activity Tolerance: Patient tolerated treatment well Patient left: Other (comment) (bathroom) Nurse Communication: Mobility status         Time: 1107-1140 PT Time Calculation (min): 33 min   Charges:   PT Evaluation $Initial PT Evaluation Tier I: 1 Procedure PT Treatments $Gait Training: 8-22 mins $Therapeutic Activity: 8-22 mins   PT G Codes:          Artice Bergerson 12/26/2013, 12:04 PM

## 2013-12-26 NOTE — Progress Notes (Signed)
Patient ID: [name], male   DOB: [birth year], 63 y.o.   MRN: [id number] Patient states that he was noncompliant with the nonweightbearing and walked to the bathroom and had immediate onset of bleeding through the dressing. Will have dressing changed today. Anticipate patient can be discharged to home tomorrow morning. Patient will not need oral antibiotics. I will followup in the office in one week.

## 2013-12-27 ENCOUNTER — Encounter (HOSPITAL_COMMUNITY): Payer: Self-pay | Admitting: Orthopedic Surgery

## 2013-12-27 LAB — BASIC METABOLIC PANEL
ANION GAP: 10 (ref 5–15)
BUN: 28 mg/dL — ABNORMAL HIGH (ref 6–23)
CO2: 26 meq/L (ref 19–32)
Calcium: 9 mg/dL (ref 8.4–10.5)
Chloride: 102 mEq/L (ref 96–112)
Creatinine, Ser: 2 mg/dL — ABNORMAL HIGH (ref 0.50–1.35)
GFR calc Af Amer: 39 mL/min — ABNORMAL LOW (ref >90)
GFR, EST NON AFRICAN AMERICAN: 34 mL/min — AB (ref >90)
GLUCOSE: 150 mg/dL — AB (ref 70–99)
POTASSIUM: 4.3 meq/L (ref 3.7–5.3)
SODIUM: 138 meq/L (ref 137–147)

## 2013-12-27 LAB — GLUCOSE, CAPILLARY: Glucose-Capillary: 201 mg/dL — ABNORMAL HIGH (ref 70–99)

## 2013-12-27 MED ORDER — HYDROCODONE-ACETAMINOPHEN 5-325 MG PO TABS
1.0000 | ORAL_TABLET | Freq: Four times a day (QID) | ORAL | Status: DC | PRN
Start: 2013-12-27 — End: 2015-12-27

## 2013-12-27 NOTE — Care Management Note (Signed)
    Page 1 of 1   12/27/2013     10:03:20 AM CARE MANAGEMENT NOTE 12/27/2013  Patient:  HARLON, WILLETTS   Account Number:  [id number]  Date Initiated:  12/27/2013  Documentation initiated by:  Sunday Spillers  Subjective/Objective Assessment:   63 yo admitted with foot infection requiring amputation of toes. PTA lived at home with spouse.     Action/Plan:   home when stable   Anticipated DC Date:  12/27/2013   Anticipated DC Plan:  [place]  CM consult      [facility]  DURABLE MEDICAL EQUIPMENT   Choice offered to / List presented to:     DME arranged  Vassie Moselle      DME agency  [place].        Status of service:  Completed, signed off Medicare Important Message given?   (If response is "NO", the following Medicare IM given date fields will be blank) Date Medicare IM given:   Medicare IM given by:   Date Additional Medicare IM given:   Additional Medicare IM given by:    Discharge Disposition:  HOME/SELF CARE  Per UR Regulation:  Reviewed for med. necessity/level of care/duration of stay  If discussed at [place] of Stay Meetings, dates discussed:    Comments:

## 2013-12-27 NOTE — Discharge Summary (Signed)
Physician Discharge Summary  [name] [id number] DOB: September 08, 1950 DOA: 12/24/2013  PCP: No PCP Per Patient  Admit date: 12/24/2013 Discharge date: 12/27/2013  Time spent: 40 minutes  Recommendations for Outpatient Follow-up:  1. Followup with Dr. [name] within 1 week. 2. Followup with primary care physician for diabetes and CKD stage III.  Discharge Diagnoses:  Principal Problem:   Foot infection Active Problems:   Diabetic foot ulcer   HTN (hypertension)   Hyperlipidemia   Hypothyroidism   CAD (coronary artery disease)   Diabetes mellitus   Foot ulcer, left   CKD (chronic kidney disease), stage III   Discharge Condition: Stable  Diet recommendation: Heart healthy  Filed Weights   12/24/13 2100  Weight: 105.96 kg (233 lb 9.6 oz)    History of present illness:  [name] is a 63 y.o. male with a history of DM2, HTN, Hyperlipidemia, and Diabetic ulcer of the Left Foot who was seen by his wound care Doctor (Dr. Gerarda Gunther sent to the ED due to worsening over the past 3 weeks despite Bactrim Rx. He reports that he has had this ulcer on his left foot for 1.5 years and has been receiving care He denies having any fevers or chills. He was placed on IV Vancomycin in the ED , and referred for medical Admission and Orthopedics Dr. [name] has been consulted to see the patient in the AM. An MRI of the left foot has also been ordered for the AM to evaluate for possible Osteomyelitis.  Hospital Course:   Foot infection/Foot ulcer, left/Diabetic foot ulcer  -Evaluated by Dr. [name] this morning, patient scheduled for left fifth ray amputation today.  -Continue IV antibiotics, after amputation will treat as cellulitis rather than osteomyelitis.  -Per Dr. [name] is noted no need for oral antibiotics, no deep abscess with acute petechial bleeding. -Patient discharged on as needed Vicodin, followup with Dr. [name] as outpatient. -Nonweightbearing, rolling walker prescribed, patient said he will  have scooter for his left leg.  Diabetes mellitus  -Restart home metformin and Tradjenta.  -Patient said he was diagnosed only for the past one year.  -Started on insulin sliding scale and carbohydrate modified diet after surgery.  -A1c is 7.0 which correlate with mean plasma glucose of 126 indicating good glycemic control.   HTN (hypertension)  -Continue Lotrel, and HCTZ,  -Monitor blood pressure, IV hydralazine as needed for high blood pressure.   Hyperlipidemia  -Continue Crestor, Zetia, and Omega 3 Fatty Acids.   Hypothyroidism  -Continue Levothyroxine   CAD (coronary artery disease)  -Continue Carvedilol, and ASA Rx.  CK stage III  -Baseline creatinine being 1.6 and 2, creatinine is 2.0  today. This is likely secondary to diabetes. -Patient is on benazepril, continued throughout the hospital stay. Check BMP in 1-2 weeks.   Procedures:  Left fifth ray amputation done by Dr. [name] on 12/25/13  Consultations:  [name] of orthopedics  Discharge Exam: Filed Vitals:   12/27/13 0517  BP: 165/85  Pulse: 72  Temp: 97.8 F (36.6 C)  Resp: 18   General: Alert and awake, oriented x3, not in any acute distress. HEENT: anicteric sclera, pupils reactive to light and accommodation, EOMI CVS: S1-S2 clear, no murmur rubs or gallops Chest: clear to auscultation bilaterally, no wheezing, rales or rhonchi Abdomen: soft nontender, nondistended, normal bowel sounds, no organomegaly Extremities: no cyanosis, clubbing or edema noted bilaterally Neuro: Cranial nerves II-XII intact, no focal neurological deficits  Discharge Instructions You were cared for by a hospitalist during  your hospital stay. If you have any questions about your discharge medications or the care you received while you were in the hospital after you are discharged, you can call the unit and asked to speak with the hospitalist on call if the hospitalist that took care of you is not available. Once you are discharged,  your primary care physician will handle any further medical issues. Please note that NO REFILLS for any discharge medications will be authorized once you are discharged, as it is imperative that you return to your primary care physician (or establish a relationship with a primary care physician if you do not have one) for your aftercare needs so that they can reassess your need for medications and monitor your lab values.  Discharge Instructions   Diet Carb Modified    Complete by:  As directed      Increase activity slowly    Complete by:  As directed           Current Discharge Medication List    START taking these medications   Details  HYDROcodone-acetaminophen (NORCO) 5-325 MG per tablet Take 1 tablet by mouth every 6 (six) hours as needed for moderate pain. Qty: 20 tablet, Refills: 0      CONTINUE these medications which have NOT CHANGED   Details  allopurinol (ZYLOPRIM) 300 MG tablet Take 300 mg by mouth daily.    amLODipine-benazepril (LOTREL) 5-10 MG per capsule Take 1 capsule by mouth daily.    aspirin EC 81 MG tablet Take 81 mg by mouth daily.    carvedilol (COREG) 3.125 MG tablet Take 3.125 mg by mouth 2 (two) times daily with a meal.     CHROMIUM PO Take 1 tablet by mouth daily.    CRESTOR 20 MG tablet Take 20 mg by mouth daily.     DEXILANT 60 MG capsule Take 60 mg by mouth daily.     ezetimibe (ZETIA) 10 MG tablet Take 10 mg by mouth daily.    gabapentin (NEURONTIN) 600 MG tablet Take 1,200 mg by mouth 3 (three) times daily.    hydrochlorothiazide (HYDRODIURIL) 25 MG tablet Take 25 mg by mouth daily.    levothyroxine (SYNTHROID, LEVOTHROID) 88 MCG tablet Take 88 mcg by mouth daily before breakfast.    linagliptin (TRADJENTA) 5 MG TABS tablet Take 5 mg by mouth daily.    MAGNESIUM PO Take 1 tablet by mouth daily.    metformin (FORTAMET) 1000 MG (OSM) 24 hr tablet Take 1,000 mg by mouth 2 (two) times daily with a meal.     Omega-3 Fatty Acids (FISH OIL)  1200 MG CAPS Take 1,200 mg by mouth 2 (two) times daily.    Probiotic Product (PROBIOTIC PO) Take 1 capsule by mouth daily.    Saw Palmetto, Serenoa repens, (SAW PALMETTO PO) Take 1 tablet by mouth daily.    VOLTAREN 1 % GEL Apply 2 g topically 4 (four) times daily as needed (pain).     tadalafil (CIALIS) 20 MG tablet Take 20 mg by mouth daily as needed for erectile dysfunction.       Allergies  Allergen Reactions  . Mushroom Extract Complex Nausea Only   Follow-up Information   Follow up with DUDA,MARCUS V, MD In 1 week.   Specialty:  Orthopedic Surgery   Contact information:   [place] [address] [phone]        The results of significant diagnostics from this hospitalization (including imaging, microbiology, ancillary and laboratory) are  listed below for reference.    Significant Diagnostic Studies: Dg Chest 2 View  12/23/2013   CLINICAL DATA:  Hypertension.  History of hyperbaric oxygen therapy.  EXAM: CHEST  2 VIEW  COMPARISON:  None.  FINDINGS: The heart size and mediastinal contours are within normal limits. Sternotomy wires are noted. No pneumothorax or pleural effusion is noted. Both lungs are clear. The visualized skeletal structures are unremarkable.  IMPRESSION: No acute cardiopulmonary abnormality seen.   Electronically Signed   By: Sabino Dick M.D.   On: 12/23/2013 08:37   Dg Foot Complete Left  12/23/2013   CLINICAL DATA:  Left foot wound over the fifth digit  EXAM: LEFT FOOT - COMPLETE 3+ VIEW  COMPARISON:  None.  FINDINGS: There is no acute fracture or dislocation. There is mild osteoarthritis of the first MTP joint. There is a soft tissue wound overlying the lateral aspect of the fifth metatarsal head. There is no apparent bone destruction or periostitis. There is no subcutaneous emphysema.  IMPRESSION: Soft tissue wound overlying the fifth metatarsal head without radiographic evidence of osteomyelitis. If there is further clinical  concern recommend MRI of the left foot without and with intravenous contrast.   Electronically Signed   By: Kathreen Devoid   On: 12/23/2013 08:41    Microbiology: Recent Results (from the past 240 hour(s))  SURGICAL PCR SCREEN     Status: Abnormal   Collection Time    12/25/13  9:47 AM      Result Value Ref Range Status   MRSA, PCR POSITIVE (*) NEGATIVE Final   Comment: RESULT CALLED TO, READ BACK BY AND VERIFIED WITH:     WILLIAMS, V. AT 1217 ON 11/24/13 BY HOBBINS, J.   Staphylococcus aureus POSITIVE (*) NEGATIVE Final   Comment:            The Xpert SA Assay (FDA     approved for NASAL specimens     in patients over 22 years of age),     is one component of     a comprehensive surveillance     program.  Test performance has     been validated by [employer] for patients greater     than or equal to 42 year old.     It is not intended     to diagnose infection nor to     guide or monitor treatment.     RESULT CALLED TO, READ BACK BY AND VERIFIED WITH:     WILLIAMS, V. AT 1217 ON 11/24/13 BY HOBBINS, J.     Labs: Basic Metabolic Panel:  Recent Labs Lab 12/24/13 1842 12/25/13 0525 12/26/13 0806 12/27/13 0450  NA 138 138 137 138  K 4.5 4.4 4.1 4.3  CL 100 100 99 102  CO2 22 23 25 26   GLUCOSE 155* 126* 182* 150*  BUN 48* 39* 28* 28*  CREATININE 2.25* 1.95* 1.69* 2.00*  CALCIUM 9.2 9.5 8.7 9.0   Liver Function Tests: No results found for this basename: AST, ALT, ALKPHOS, BILITOT, PROT, ALBUMIN,  in the last 168 hours No results found for this basename: LIPASE, AMYLASE,  in the last 168 hours No results found for this basename: AMMONIA,  in the last 168 hours CBC:  Recent Labs Lab 12/24/13 1842 12/25/13 0525  WBC 5.4 7.4  NEUTROABS 3.8  --   HGB 12.9* 13.1  HCT 38.2* 40.0  MCV 85.5 86.0  PLT 175 206   Cardiac Enzymes:  No results found for this basename: CKTOTAL, CKMB, CKMBINDEX, TROPONINI,  in the last 168 hours BNP: BNP (last 3 results) No results  found for this basename: PROBNP,  in the last 8760 hours CBG:  Recent Labs Lab 12/26/13 0736 12/26/13 1208 12/26/13 1733 12/26/13 2204 12/27/13 0742  GLUCAP 146* 183* 205* 166* 201*       Signed:  Echo Propp A  [facility] 12/27/2013, 10:44 AM

## 2013-12-27 NOTE — Progress Notes (Signed)
[facility]  [facility] is providing the following services: RW  If patient discharges after hours, please call [phone].   Linward Headland 12/27/2013, 10:11 AM

## 2014-02-17 HISTORY — PX: CARDIAC CATHETERIZATION: SHX172

## 2014-12-27 ENCOUNTER — Ambulatory Visit
Admission: RE | Admit: 2014-12-27 | Discharge: 2014-12-27 | Disposition: A | Discharge status: Home / Self Care | Payer: BLUE CROSS/BLUE SHIELD | Source: Ambulatory Visit | Attending: Family Medicine | Admitting: Family Medicine

## 2014-12-27 ENCOUNTER — Other Ambulatory Visit: Payer: Self-pay | Admitting: Family Medicine

## 2014-12-27 DIAGNOSIS — R1915 Other abnormal bowel sounds: Secondary | ICD-10-CM

## 2014-12-27 DIAGNOSIS — R109 Unspecified abdominal pain: Secondary | ICD-10-CM

## 2014-12-28 ENCOUNTER — Emergency Department (HOSPITAL_COMMUNITY): Payer: BLUE CROSS/BLUE SHIELD

## 2014-12-28 ENCOUNTER — Emergency Department (HOSPITAL_COMMUNITY)
Admission: EM | Admit: 2014-12-28 | Discharge: 2014-12-28 | Disposition: A | Discharge status: Home / Self Care | Payer: BLUE CROSS/BLUE SHIELD | Attending: Emergency Medicine | Admitting: Emergency Medicine

## 2014-12-28 ENCOUNTER — Encounter (HOSPITAL_COMMUNITY): Payer: Self-pay | Admitting: *Deleted

## 2014-12-28 DIAGNOSIS — Z87891 Personal history of nicotine dependence: Secondary | ICD-10-CM | POA: Insufficient documentation

## 2014-12-28 DIAGNOSIS — G629 Polyneuropathy, unspecified: Secondary | ICD-10-CM | POA: Insufficient documentation

## 2014-12-28 DIAGNOSIS — I2510 Atherosclerotic heart disease of native coronary artery without angina pectoris: Secondary | ICD-10-CM | POA: Diagnosis not present

## 2014-12-28 DIAGNOSIS — E782 Mixed hyperlipidemia: Secondary | ICD-10-CM | POA: Insufficient documentation

## 2014-12-28 DIAGNOSIS — Z7982 Long term (current) use of aspirin: Secondary | ICD-10-CM | POA: Insufficient documentation

## 2014-12-28 DIAGNOSIS — R109 Unspecified abdominal pain: Secondary | ICD-10-CM

## 2014-12-28 DIAGNOSIS — E119 Type 2 diabetes mellitus without complications: Secondary | ICD-10-CM | POA: Diagnosis not present

## 2014-12-28 DIAGNOSIS — I10 Essential (primary) hypertension: Secondary | ICD-10-CM | POA: Diagnosis not present

## 2014-12-28 DIAGNOSIS — Z79899 Other long term (current) drug therapy: Secondary | ICD-10-CM | POA: Insufficient documentation

## 2014-12-28 DIAGNOSIS — R1084 Generalized abdominal pain: Secondary | ICD-10-CM | POA: Insufficient documentation

## 2014-12-28 LAB — URINE MICROSCOPIC-ADD ON

## 2014-12-28 LAB — URINALYSIS, ROUTINE W REFLEX MICROSCOPIC
Bilirubin Urine: NEGATIVE
KETONES UR: NEGATIVE mg/dL
Leukocytes, UA: NEGATIVE
Nitrite: NEGATIVE
Protein, ur: >300 mg/dL — AB
Specific Gravity, Urine: 1.03 (ref 1.005–1.030)
Urobilinogen, UA: 0.2 mg/dL (ref 0.0–1.0)
pH: 5 (ref 5.0–8.0)

## 2014-12-28 LAB — COMPREHENSIVE METABOLIC PANEL
ALBUMIN: 3.7 g/dL (ref 3.5–5.0)
ALK PHOS: 69 U/L (ref 38–126)
ALT: 47 U/L (ref 17–63)
AST: 50 U/L — ABNORMAL HIGH (ref 15–41)
Anion gap: 7 (ref 5–15)
BILIRUBIN TOTAL: 1 mg/dL (ref 0.3–1.2)
BUN: 39 mg/dL — ABNORMAL HIGH (ref 6–20)
CALCIUM: 8.7 mg/dL — AB (ref 8.9–10.3)
CO2: 19 mmol/L — ABNORMAL LOW (ref 22–32)
CREATININE: 2.22 mg/dL — AB (ref 0.61–1.24)
Chloride: 110 mmol/L (ref 101–111)
GFR calc Af Amer: 34 mL/min — ABNORMAL LOW (ref >60)
GFR calc non Af Amer: 29 mL/min — ABNORMAL LOW (ref >60)
GLUCOSE: 194 mg/dL — AB (ref 65–99)
Potassium: 4.8 mmol/L (ref 3.5–5.1)
SODIUM: 136 mmol/L (ref 135–145)
TOTAL PROTEIN: 5.7 g/dL — AB (ref 6.5–8.1)

## 2014-12-28 LAB — CBC
HCT: 41 % (ref 39.0–52.0)
Hemoglobin: 13.9 g/dL (ref 13.0–17.0)
MCH: 29 pg (ref 26.0–34.0)
MCHC: 33.9 g/dL (ref 30.0–36.0)
MCV: 85.6 fL (ref 78.0–100.0)
PLATELETS: 156 10*3/uL (ref 150–400)
RBC: 4.79 MIL/uL (ref 4.22–5.81)
RDW: 14.6 % (ref 11.5–15.5)
WBC: 6.4 10*3/uL (ref 4.0–10.5)

## 2014-12-28 LAB — LIPASE, BLOOD: Lipase: 52 U/L — ABNORMAL HIGH (ref 22–51)

## 2014-12-28 MED ORDER — SODIUM CHLORIDE 0.9 % IV SOLN
1000.0000 mL | INTRAVENOUS | Status: DC
  Administered 2014-12-28: 1000 mL via INTRAVENOUS

## 2014-12-28 MED ORDER — RANITIDINE HCL 150 MG PO TABS: 150.0000 mg | ORAL_TABLET | Freq: Two times a day (BID) | ORAL | Status: DC

## 2014-12-28 MED ORDER — SODIUM CHLORIDE 0.9 % IV SOLN
1000.0000 mL | Freq: Once | INTRAVENOUS | Status: AC
  Administered 2014-12-28: 1000 mL via INTRAVENOUS

## 2014-12-28 MED ORDER — IOHEXOL 300 MG/ML  SOLN
50.0000 mL | Freq: Once | INTRAMUSCULAR | Status: AC | PRN
  Administered 2014-12-28: 50 mL via ORAL

## 2014-12-28 MED ORDER — ACETAMINOPHEN 325 MG PO TABS
650.0000 mg | ORAL_TABLET | Freq: Once | ORAL | Status: DC
  Filled 2014-12-28: qty 2

## 2014-12-28 NOTE — ED Provider Notes (Signed)
CSN: [id number]     Arrival date & time 12/28/14  [id number] History   First MD Initiated Contact with Patient 12/28/14 1002     Chief Complaint  Patient presents with  . Pancreatitis     (Consider location/radiation/quality/duration/timing/severity/associated sxs/prior Treatment) HPI  [name] is a 64 y.o. male with PMH significant for CAD, hypertension, hypercholesterolemia, neuropathy, diabetes, qcute kidney disease who presents after being referred to the ED by his PCP for possible pancreatitis. Patient states he has been experiencing constant epigastric squeezing/pushing nonradiating abdominal pain for the past week. Associated symptoms include nausea, malodorous diarrhea, anorexia, and fatigue. Denies fevers, chills, chest pain, shortness of breath, vomiting, polyuria, polydipsia, cough, headache, or difficulty walking. Note he was started on toujeo (insulin glargine) 1 week ago. He has been able to tolerate adequate fluid intake. He states he normally has a voracious appetite, however over the past week he has been eating toast and applesauce throughout the day. Denies tobacco use, alcohol use, drug use. Abdominal surgeries include cholecystectomy over 20 years ago. Abdominal x-ray performed yesterday shows no evidence of obstruction or free air in the abdomen.   Past Medical History  Diagnosis Date  . Coronary artery disease   . Hypertension   . Hypercholesteremia   . Neuropathy   . Diabetes mellitus without complication    Past Surgical History  Procedure Laterality Date  . Coronary artery bypass graft    . Cholecystectomy    . Back surgery    . Amputation Left 12/25/2013    Procedure: AMPUTATION RAY LEFT 5TH RAY;  Surgeon: [name], MD;  Location: [facility];  Service: Orthopedics;  Laterality: Left;   Family History  Problem Relation Age of Onset  . Diabetes Mellitus II Mother   . Diabetes Mellitus II Father   . CAD Father    Social History  Substance Use Topics  .  Smoking status: Former Smoker    Types: Cigars    Quit date: 12/24/1988  . Smokeless tobacco: Never Used  . Alcohol Use: No    Review of Systems All other systems negative unless otherwise stated in HPI    Allergies  Mushroom extract complex  Home Medications   Prior to Admission medications   Medication Sig Start Date End Date Taking? Authorizing Provider  allopurinol (ZYLOPRIM) 300 MG tablet Take 300 mg by mouth daily.    Historical Provider, MD  amLODipine-benazepril (LOTREL) 5-10 MG per capsule Take 1 capsule by mouth daily.    Historical Provider, MD  aspirin EC 81 MG tablet Take 81 mg by mouth daily.    Historical Provider, MD  carvedilol (COREG) 3.125 MG tablet Take 3.125 mg by mouth 2 (two) times daily with a meal.  12/03/13   Historical Provider, MD  CHROMIUM PO Take 1 tablet by mouth daily.    Historical Provider, MD  CRESTOR 20 MG tablet Take 20 mg by mouth daily.  10/29/13   Historical Provider, MD  DEXILANT 60 MG capsule Take 60 mg by mouth daily.  12/24/13   Historical Provider, MD  ezetimibe (ZETIA) 10 MG tablet Take 10 mg by mouth daily.    Historical Provider, MD  gabapentin (NEURONTIN) 600 MG tablet Take 1,200 mg by mouth 3 (three) times daily.    Historical Provider, MD  hydrochlorothiazide (HYDRODIURIL) 25 MG tablet Take 25 mg by mouth daily.    Historical Provider, MD  HYDROcodone-acetaminophen (NORCO) 5-325 MG per tablet Take 1 tablet by mouth every 6 (six) hours as needed  for moderate pain. 12/27/13   Verlee Monte, MD  levothyroxine (SYNTHROID, LEVOTHROID) 88 MCG tablet Take 88 mcg by mouth daily before breakfast.    Historical Provider, MD  linagliptin (TRADJENTA) 5 MG TABS tablet Take 5 mg by mouth daily.    Historical Provider, MD  MAGNESIUM PO Take 1 tablet by mouth daily.    Historical Provider, MD  metformin (FORTAMET) 1000 MG (OSM) 24 hr tablet Take 1,000 mg by mouth 2 (two) times daily with a meal.  11/29/13   Historical Provider, MD  Omega-3 Fatty Acids  (FISH OIL) 1200 MG CAPS Take 1,200 mg by mouth 2 (two) times daily.    Historical Provider, MD  Probiotic Product (PROBIOTIC PO) Take 1 capsule by mouth daily.    Historical Provider, MD  Saw Palmetto, Serenoa repens, (SAW PALMETTO PO) Take 1 tablet by mouth daily.    Historical Provider, MD  tadalafil (CIALIS) 20 MG tablet Take 20 mg by mouth daily as needed for erectile dysfunction.    Historical Provider, MD  VOLTAREN 1 % GEL Apply 2 g topically 4 (four) times daily as needed (pain).  11/29/13   Historical Provider, MD   BP 126/57 mmHg  Pulse 69  Temp(Src) 97.8 F (36.6 C) (Oral)  Resp 12  SpO2 99% Physical Exam  Constitutional: He is oriented to person, place, and time. He appears well-developed and well-nourished.  HENT:  Head: Normocephalic and atraumatic.  Mouth/Throat: Oropharynx is clear and moist.  Eyes: Pupils are equal, round, and reactive to light.  Neck: Normal range of motion. Neck supple.  Cardiovascular: Normal rate, regular rhythm and normal heart sounds.   No murmur heard. Pulmonary/Chest: Effort normal and breath sounds normal. No respiratory distress. He has no wheezes. He has no rales.  Abdominal: Soft. Bowel sounds are normal. He exhibits no distension. There is generalized tenderness. There is no rebound and no guarding.    No peritoneal signs.   Musculoskeletal: Normal range of motion.  Lymphadenopathy:    He has no cervical adenopathy.  Neurological: He is alert and oriented to person, place, and time. He has normal strength. No sensory deficit.  Skin: Skin is warm and dry.  Psychiatric: He has a normal mood and affect. His behavior is normal.    ED Course  Procedures (including critical care time) Labs Review Labs Reviewed  LIPASE, BLOOD - Abnormal; Notable for the following:    Lipase 52 (*)    All other components within normal limits  COMPREHENSIVE METABOLIC PANEL - Abnormal; Notable for the following:    CO2 19 (*)    Glucose, Bld 194 (*)     BUN 39 (*)    Creatinine, Ser 2.22 (*)    Calcium 8.7 (*)    Total Protein 5.7 (*)    AST 50 (*)    GFR calc non Af Amer 29 (*)    GFR calc Af Amer 34 (*)    All other components within normal limits  URINALYSIS, ROUTINE W REFLEX MICROSCOPIC (NOT AT [facility]) - Abnormal; Notable for the following:    Glucose, UA >1000 (*)    Hgb urine dipstick SMALL (*)    Protein, ur >300 (*)    All other components within normal limits  URINE MICROSCOPIC-ADD ON - Abnormal; Notable for the following:    Bacteria, UA FEW (*)    Casts HYALINE CASTS (*)    All other components within normal limits  CBC    Imaging Review Ct Abdomen Pelvis Wo  Contrast  12/28/2014   CLINICAL DATA:  Acute epigastric and right lower quadrant abdominal pain.  EXAM: CT ABDOMEN AND PELVIS WITHOUT CONTRAST  TECHNIQUE: Multidetector CT imaging of the abdomen and pelvis was performed following the standard protocol without IV contrast.  COMPARISON:  None.  FINDINGS: Severe degenerative disc disease is noted at L4-5. Visualized lung bases appear normal.  Status post cholecystectomy. No focal abnormality is noted in the liver, spleen or pancreas on these unenhanced images. Right adrenal gland and kidneys appear normal. 11 mm left adrenal nodule is noted. No hydronephrosis or renal obstruction is noted. No renal or ureteral calculi are noted. Atherosclerosis of abdominal aorta is noted without aneurysm formation. The appendix appears normal. There is no evidence of bowel obstruction. No abnormal fluid collection is noted. Sigmoid diverticulosis is noted without inflammation. Urinary bladder appears normal. Mild prostatic enlargement is noted. No significant adenopathy is noted.  IMPRESSION: Atherosclerosis of abdominal aorta without aneurysm formation.  Sigmoid diverticulosis without inflammation.  Mild prostatic enlargement.  No hydronephrosis or renal obstruction is noted. No renal or ureteral calculi are noted.  11 mm left adrenal nodule is  noted which most likely represents benign adenoma. Follow-up CT scan or MRI in 6-12 months is recommended to ensure stability.   Electronically Signed   By: Marijo Conception, M.D.   On: 12/28/2014 14:19   Dg Abd Acute W/chest  12/27/2014   CLINICAL DATA:  Epigastric pain, nausea  EXAM: DG ABDOMEN ACUTE W/ 1V CHEST  COMPARISON:  Chest x-ray of 12/22/2013  FINDINGS: No active infiltrate or effusion is seen. Mediastinal and hilar contours are unremarkable. Median sternotomy sutures are noted. The heart is within normal limits in size.  Supine and erect views of the abdomen show no bowel obstruction. No free air is noted. Surgical clips are present in the right upper quadrant from prior cholecystectomy. There are degenerative changes in the lower lumbar spine.  IMPRESSION: 1. No active lung disease. 2. No bowel obstruction.  No free air.   Electronically Signed   By: Ivar Drape M.D.   On: 12/27/2014 16:42   I have personally reviewed and evaluated these images and lab results as part of my medical decision-making.   EKG Interpretation None      MDM   Final diagnoses:  None   Patient presents with one-week history of abdominal pain with nausea, S diarrhea, anorexia, and fatigue. VSS, patient appears in no acute distress and nontoxic. We'll start IV fluids, and pain management. Labs pending. Will order abdominal CT. Concern for pancreatitis vs appendicitis vs GERD.  Low suspicion of mesenteric ischemia.   Lipase 52. Creatinine 2.22 (unchanged).  AST 50.  UA shows glycosuria and proteinuria, most likely from chronic kidney disease. Abdominal CT pending.  Abdominal CT shows no focal abnormality noted in the spleen, liver, or pancreas.  No evidence of bowel obstruction. No abnormal fluid collection. No renal or ureteral calculi. Evidence of sigmoid diverticulosis without inflammation. Atherosclerosis of the abdominal aorta without aneurysm formation. 11 mm left adrenal nodule noted which most likely  represents benign adenoma. Follow-up CT scan or MRI in 6-12 months is recommended.  No evidence of acute abdominal processes. Will discharge home. Patient will follow up outpatient GI. Patient given strict return precautions. Patient acknowledges and agrees with this plan.  Case has been discussed with and seen by Dr. Oleta Mouse who agrees with the above plan for discharge with GI follow up.   Gloriann Loan, PA-C 12/28/14 [place]  Roderic Ovens, MD 12/28/14 [phone]

## 2014-12-28 NOTE — ED Notes (Signed)
Pt reports abdominal pain, nausea, and diarhea for 1 week. Pt was seen by his PCP and told to come here for R/O pancreatitis.

## 2014-12-28 NOTE — ED Notes (Signed)
Patient transported to CT 

## 2014-12-28 NOTE — Discharge Instructions (Signed)

## 2015-04-06 ENCOUNTER — Other Ambulatory Visit: Payer: Self-pay | Admitting: Family Medicine

## 2015-04-06 ENCOUNTER — Ambulatory Visit
Admission: RE | Admit: 2015-04-06 | Discharge: 2015-04-06 | Disposition: A | Discharge status: Home / Self Care | Payer: BLUE CROSS/BLUE SHIELD | Source: Ambulatory Visit | Attending: Family Medicine | Admitting: Family Medicine

## 2015-04-06 DIAGNOSIS — M5489 Other dorsalgia: Secondary | ICD-10-CM

## 2015-04-06 DIAGNOSIS — R52 Pain, unspecified: Secondary | ICD-10-CM

## 2015-08-28 ENCOUNTER — Other Ambulatory Visit: Payer: Self-pay | Admitting: Orthopedic Surgery

## 2015-08-28 DIAGNOSIS — M25512 Pain in left shoulder: Secondary | ICD-10-CM

## 2015-09-05 ENCOUNTER — Ambulatory Visit
Admission: RE | Admit: 2015-09-05 | Discharge: 2015-09-05 | Disposition: A | Discharge status: Home / Self Care | Payer: 59 | Source: Ambulatory Visit | Attending: Orthopedic Surgery | Admitting: Orthopedic Surgery

## 2015-09-05 DIAGNOSIS — M25512 Pain in left shoulder: Secondary | ICD-10-CM

## 2015-12-24 ENCOUNTER — Inpatient Hospital Stay (HOSPITAL_COMMUNITY)
Admission: EM | Admit: 2015-12-24 | Discharge: 2015-12-27 | DRG: 872 | Disposition: A | Discharge status: Home / Self Care | Payer: Managed Care, Other (non HMO) | Attending: Internal Medicine | Admitting: Internal Medicine

## 2015-12-24 ENCOUNTER — Emergency Department (HOSPITAL_COMMUNITY): Payer: Managed Care, Other (non HMO)

## 2015-12-24 ENCOUNTER — Encounter (HOSPITAL_COMMUNITY): Payer: Self-pay

## 2015-12-24 ENCOUNTER — Inpatient Hospital Stay (HOSPITAL_COMMUNITY)
Admit: 2015-12-24 | Discharge: 2015-12-24 | Disposition: A | Discharge status: Home / Self Care | Payer: Managed Care, Other (non HMO) | Attending: Emergency Medicine | Admitting: Emergency Medicine

## 2015-12-24 DIAGNOSIS — M79609 Pain in unspecified limb: Secondary | ICD-10-CM | POA: Diagnosis not present

## 2015-12-24 DIAGNOSIS — I10 Essential (primary) hypertension: Secondary | ICD-10-CM | POA: Diagnosis not present

## 2015-12-24 DIAGNOSIS — N189 Chronic kidney disease, unspecified: Secondary | ICD-10-CM

## 2015-12-24 DIAGNOSIS — I129 Hypertensive chronic kidney disease with stage 1 through stage 4 chronic kidney disease, or unspecified chronic kidney disease: Secondary | ICD-10-CM | POA: Diagnosis present

## 2015-12-24 DIAGNOSIS — E860 Dehydration: Secondary | ICD-10-CM | POA: Diagnosis present

## 2015-12-24 DIAGNOSIS — E7800 Pure hypercholesterolemia, unspecified: Secondary | ICD-10-CM | POA: Diagnosis present

## 2015-12-24 DIAGNOSIS — G629 Polyneuropathy, unspecified: Secondary | ICD-10-CM | POA: Diagnosis not present

## 2015-12-24 DIAGNOSIS — A419 Sepsis, unspecified organism: Principal | ICD-10-CM | POA: Diagnosis present

## 2015-12-24 DIAGNOSIS — E1165 Type 2 diabetes mellitus with hyperglycemia: Secondary | ICD-10-CM | POA: Diagnosis present

## 2015-12-24 DIAGNOSIS — E1122 Type 2 diabetes mellitus with diabetic chronic kidney disease: Secondary | ICD-10-CM | POA: Diagnosis present

## 2015-12-24 DIAGNOSIS — L03115 Cellulitis of right lower limb: Secondary | ICD-10-CM | POA: Diagnosis present

## 2015-12-24 DIAGNOSIS — M7989 Other specified soft tissue disorders: Secondary | ICD-10-CM

## 2015-12-24 DIAGNOSIS — W010XXA Fall on same level from slipping, tripping and stumbling without subsequent striking against object, initial encounter: Secondary | ICD-10-CM | POA: Diagnosis present

## 2015-12-24 DIAGNOSIS — Z951 Presence of aortocoronary bypass graft: Secondary | ICD-10-CM

## 2015-12-24 DIAGNOSIS — N179 Acute kidney failure, unspecified: Secondary | ICD-10-CM | POA: Diagnosis present

## 2015-12-24 DIAGNOSIS — Z87891 Personal history of nicotine dependence: Secondary | ICD-10-CM

## 2015-12-24 DIAGNOSIS — E039 Hypothyroidism, unspecified: Secondary | ICD-10-CM | POA: Diagnosis present

## 2015-12-24 DIAGNOSIS — Z833 Family history of diabetes mellitus: Secondary | ICD-10-CM

## 2015-12-24 DIAGNOSIS — I2510 Atherosclerotic heart disease of native coronary artery without angina pectoris: Secondary | ICD-10-CM | POA: Diagnosis present

## 2015-12-24 DIAGNOSIS — E1159 Type 2 diabetes mellitus with other circulatory complications: Secondary | ICD-10-CM | POA: Diagnosis present

## 2015-12-24 DIAGNOSIS — N183 Chronic kidney disease, stage 3 (moderate): Secondary | ICD-10-CM | POA: Diagnosis present

## 2015-12-24 DIAGNOSIS — Z89422 Acquired absence of other left toe(s): Secondary | ICD-10-CM | POA: Diagnosis not present

## 2015-12-24 DIAGNOSIS — Z7984 Long term (current) use of oral hypoglycemic drugs: Secondary | ICD-10-CM | POA: Diagnosis not present

## 2015-12-24 DIAGNOSIS — Z7982 Long term (current) use of aspirin: Secondary | ICD-10-CM | POA: Diagnosis not present

## 2015-12-24 DIAGNOSIS — Z8249 Family history of ischemic heart disease and other diseases of the circulatory system: Secondary | ICD-10-CM | POA: Diagnosis not present

## 2015-12-24 DIAGNOSIS — Y92410 Unspecified street and highway as the place of occurrence of the external cause: Secondary | ICD-10-CM

## 2015-12-24 DIAGNOSIS — E785 Hyperlipidemia, unspecified: Secondary | ICD-10-CM | POA: Diagnosis present

## 2015-12-24 DIAGNOSIS — E1140 Type 2 diabetes mellitus with diabetic neuropathy, unspecified: Secondary | ICD-10-CM | POA: Diagnosis present

## 2015-12-24 DIAGNOSIS — M79661 Pain in right lower leg: Secondary | ICD-10-CM | POA: Diagnosis present

## 2015-12-24 LAB — CBC WITH DIFFERENTIAL/PLATELET
BASOS PCT: 0 %
Basophils Absolute: 0 10*3/uL (ref 0.0–0.1)
EOS PCT: 0 %
Eosinophils Absolute: 0 10*3/uL (ref 0.0–0.7)
HEMATOCRIT: 41.6 % (ref 39.0–52.0)
HEMOGLOBIN: 14.1 g/dL (ref 13.0–17.0)
LYMPHS PCT: 7 %
Lymphs Abs: 0.9 10*3/uL (ref 0.7–4.0)
MCH: 29.7 pg (ref 26.0–34.0)
MCHC: 33.9 g/dL (ref 30.0–36.0)
MCV: 87.6 fL (ref 78.0–100.0)
MONOS PCT: 4 %
Monocytes Absolute: 0.5 10*3/uL (ref 0.1–1.0)
NEUTROS ABS: 11 10*3/uL — AB (ref 1.7–7.7)
NEUTROS PCT: 89 %
Platelets: 135 10*3/uL — ABNORMAL LOW (ref 150–400)
RBC: 4.75 MIL/uL (ref 4.22–5.81)
RDW: 14.9 % (ref 11.5–15.5)
WBC: 12.4 10*3/uL — ABNORMAL HIGH (ref 4.0–10.5)

## 2015-12-24 LAB — COMPREHENSIVE METABOLIC PANEL
ALT: 24 U/L (ref 17–63)
AST: 38 U/L (ref 15–41)
Albumin: 3.7 g/dL (ref 3.5–5.0)
Alkaline Phosphatase: 60 U/L (ref 38–126)
Anion gap: 9 (ref 5–15)
BILIRUBIN TOTAL: 1.1 mg/dL (ref 0.3–1.2)
BUN: 41 mg/dL — AB (ref 6–20)
CHLORIDE: 108 mmol/L (ref 101–111)
CO2: 17 mmol/L — ABNORMAL LOW (ref 22–32)
CREATININE: 2.75 mg/dL — AB (ref 0.61–1.24)
Calcium: 8.3 mg/dL — ABNORMAL LOW (ref 8.9–10.3)
GFR calc Af Amer: 26 mL/min — ABNORMAL LOW (ref >60)
GFR calc non Af Amer: 22 mL/min — ABNORMAL LOW (ref >60)
Glucose, Bld: 148 mg/dL — ABNORMAL HIGH (ref 65–99)
Potassium: 4.7 mmol/L (ref 3.5–5.1)
Sodium: 134 mmol/L — ABNORMAL LOW (ref 135–145)
TOTAL PROTEIN: 6.1 g/dL — AB (ref 6.5–8.1)

## 2015-12-24 LAB — I-STAT CG4 LACTIC ACID, ED
LACTIC ACID, VENOUS: 2.25 mmol/L — AB (ref 0.5–1.9)
LACTIC ACID, VENOUS: 1.89 mmol/L (ref 0.5–1.9)

## 2015-12-24 LAB — GLUCOSE, CAPILLARY: GLUCOSE-CAPILLARY: 171 mg/dL — AB (ref 65–99)

## 2015-12-24 MED ORDER — ONDANSETRON HCL 4 MG/2ML IJ SOLN
4.0000 mg | Freq: Once | INTRAMUSCULAR | Status: AC
  Administered 2015-12-24: 4 mg via INTRAVENOUS
  Filled 2015-12-24: qty 2

## 2015-12-24 MED ORDER — SODIUM CHLORIDE 0.9 % IV SOLN
Freq: Once | INTRAVENOUS | Status: AC
  Administered 2015-12-24: 20:00:00 via INTRAVENOUS

## 2015-12-24 MED ORDER — MORPHINE SULFATE (PF) 4 MG/ML IV SOLN
4.0000 mg | Freq: Once | INTRAVENOUS | Status: AC
  Administered 2015-12-24: 4 mg via INTRAVENOUS
  Filled 2015-12-24: qty 1

## 2015-12-24 MED ORDER — SODIUM CHLORIDE 0.9% FLUSH
3.0000 mL | Freq: Two times a day (BID) | INTRAVENOUS | Status: DC
  Administered 2015-12-24 – 2015-12-27 (×4): 3 mL via INTRAVENOUS

## 2015-12-24 MED ORDER — ROSUVASTATIN CALCIUM 20 MG PO TABS
20.0000 mg | ORAL_TABLET | Freq: Every day | ORAL | Status: DC
  Administered 2015-12-24 – 2015-12-25 (×2): 20 mg via ORAL
  Filled 2015-12-24 (×4): qty 1

## 2015-12-24 MED ORDER — AMLODIPINE BESYLATE 5 MG PO TABS
5.0000 mg | ORAL_TABLET | Freq: Every day | ORAL | Status: DC
  Administered 2015-12-25 – 2015-12-27 (×3): 5 mg via ORAL
  Filled 2015-12-24 (×3): qty 1

## 2015-12-24 MED ORDER — INSULIN ASPART 100 UNIT/ML ~~LOC~~ SOLN
0.0000 [IU] | Freq: Every day | SUBCUTANEOUS | Status: DC
  Administered 2015-12-25: 3 [IU] via SUBCUTANEOUS

## 2015-12-24 MED ORDER — ASPIRIN EC 81 MG PO TBEC
81.0000 mg | DELAYED_RELEASE_TABLET | Freq: Every day | ORAL | Status: DC
  Administered 2015-12-24 – 2015-12-27 (×4): 81 mg via ORAL
  Filled 2015-12-24 (×4): qty 1

## 2015-12-24 MED ORDER — ACETAMINOPHEN 650 MG RE SUPP: 650.0000 mg | Freq: Four times a day (QID) | RECTAL | Status: DC | PRN

## 2015-12-24 MED ORDER — INSULIN ASPART 100 UNIT/ML ~~LOC~~ SOLN
0.0000 [IU] | Freq: Three times a day (TID) | SUBCUTANEOUS | Status: DC
  Administered 2015-12-25: 2 [IU] via SUBCUTANEOUS
  Administered 2015-12-25: 3 [IU] via SUBCUTANEOUS
  Administered 2015-12-26: 1 [IU] via SUBCUTANEOUS

## 2015-12-24 MED ORDER — VANCOMYCIN HCL IN DEXTROSE 1-5 GM/200ML-% IV SOLN: 1000.0000 mg | Freq: Once | INTRAVENOUS | Status: DC

## 2015-12-24 MED ORDER — CARVEDILOL 3.125 MG PO TABS
3.1250 mg | ORAL_TABLET | Freq: Two times a day (BID) | ORAL | Status: DC
  Administered 2015-12-25 – 2015-12-26 (×2): 3.125 mg via ORAL
  Filled 2015-12-24 (×2): qty 1

## 2015-12-24 MED ORDER — LEVOTHYROXINE SODIUM 88 MCG PO TABS
88.0000 ug | ORAL_TABLET | Freq: Every day | ORAL | Status: DC
  Administered 2015-12-25: 88 ug via ORAL
  Filled 2015-12-24: qty 1

## 2015-12-24 MED ORDER — PIPERACILLIN-TAZOBACTAM 3.375 G IVPB 30 MIN: 3.3750 g | Freq: Once | INTRAVENOUS | Status: DC

## 2015-12-24 MED ORDER — SODIUM CHLORIDE 0.9 % IV SOLN
INTRAVENOUS | Status: AC
  Administered 2015-12-24: 19:00:00 via INTRAVENOUS

## 2015-12-24 MED ORDER — PANTOPRAZOLE SODIUM 40 MG PO TBEC
40.0000 mg | DELAYED_RELEASE_TABLET | Freq: Every day | ORAL | Status: DC
  Administered 2015-12-24 – 2015-12-27 (×4): 40 mg via ORAL
  Filled 2015-12-24 (×4): qty 1

## 2015-12-24 MED ORDER — MORPHINE SULFATE (PF) 2 MG/ML IV SOLN
2.0000 mg | INTRAVENOUS | Status: DC | PRN
  Administered 2015-12-24 – 2015-12-25 (×2): 2 mg via INTRAVENOUS
  Filled 2015-12-24 (×2): qty 1

## 2015-12-24 MED ORDER — SODIUM CHLORIDE 0.9 % IV BOLUS (SEPSIS)
1000.0000 mL | Freq: Once | INTRAVENOUS | Status: AC
  Administered 2015-12-24: 1000 mL via INTRAVENOUS

## 2015-12-24 MED ORDER — EZETIMIBE 10 MG PO TABS
10.0000 mg | ORAL_TABLET | Freq: Every day | ORAL | Status: DC
  Administered 2015-12-24 – 2015-12-27 (×4): 10 mg via ORAL
  Filled 2015-12-24 (×4): qty 1

## 2015-12-24 MED ORDER — GABAPENTIN 600 MG PO TABS
300.0000 mg | ORAL_TABLET | Freq: Three times a day (TID) | ORAL | Status: DC
  Administered 2015-12-24 – 2015-12-27 (×8): 300 mg via ORAL
  Filled 2015-12-24 (×8): qty 1

## 2015-12-24 MED ORDER — ALLOPURINOL 300 MG PO TABS
300.0000 mg | ORAL_TABLET | Freq: Every day | ORAL | Status: DC
  Administered 2015-12-24 – 2015-12-27 (×4): 300 mg via ORAL
  Filled 2015-12-24 (×4): qty 1

## 2015-12-24 MED ORDER — RISAQUAD PO CAPS
ORAL_CAPSULE | Freq: Every day | ORAL | Status: DC
  Administered 2015-12-24 – 2015-12-27 (×4): 1 via ORAL
  Filled 2015-12-24 (×4): qty 1

## 2015-12-24 MED ORDER — ONDANSETRON HCL 4 MG/2ML IJ SOLN: 4.0000 mg | Freq: Four times a day (QID) | INTRAMUSCULAR | Status: DC | PRN

## 2015-12-24 MED ORDER — SODIUM CHLORIDE 0.9 % IV BOLUS (SEPSIS)
1000.0000 mL | Freq: Once | INTRAVENOUS | Status: AC
Start: 2015-12-24 — End: 2015-12-24
  Administered 2015-12-24: 1000 mL via INTRAVENOUS

## 2015-12-24 MED ORDER — ACETAMINOPHEN 325 MG PO TABS
650.0000 mg | ORAL_TABLET | Freq: Four times a day (QID) | ORAL | Status: DC | PRN
  Administered 2015-12-24 – 2015-12-25 (×2): 650 mg via ORAL
  Filled 2015-12-24 (×2): qty 2

## 2015-12-24 MED ORDER — VANCOMYCIN HCL IN DEXTROSE 1-5 GM/200ML-% IV SOLN
1000.0000 mg | INTRAVENOUS | Status: AC
  Administered 2015-12-24: 1000 mg via INTRAVENOUS
  Filled 2015-12-24: qty 200

## 2015-12-24 MED ORDER — ONDANSETRON HCL 4 MG PO TABS: 4.0000 mg | ORAL_TABLET | Freq: Four times a day (QID) | ORAL | Status: DC | PRN

## 2015-12-24 MED ORDER — HEPARIN SODIUM (PORCINE) 5000 UNIT/ML IJ SOLN
5000.0000 [IU] | Freq: Three times a day (TID) | INTRAMUSCULAR | Status: DC
  Administered 2015-12-24 – 2015-12-25 (×3): 5000 [IU] via SUBCUTANEOUS
  Filled 2015-12-24 (×4): qty 1

## 2015-12-24 MED ORDER — PIPERACILLIN-TAZOBACTAM 3.375 G IVPB
3.3750 g | Freq: Three times a day (TID) | INTRAVENOUS | Status: DC
Start: 2015-12-25 — End: 2015-12-27
  Administered 2015-12-25 – 2015-12-27 (×8): 3.375 g via INTRAVENOUS
  Filled 2015-12-24 (×10): qty 50

## 2015-12-24 MED ORDER — VANCOMYCIN HCL IN DEXTROSE 1-5 GM/200ML-% IV SOLN
1000.0000 mg | Freq: Once | INTRAVENOUS | Status: AC
  Administered 2015-12-24: 1000 mg via INTRAVENOUS
  Filled 2015-12-24: qty 200

## 2015-12-24 MED ORDER — PIPERACILLIN-TAZOBACTAM 3.375 G IVPB 30 MIN
3.3750 g | Freq: Once | INTRAVENOUS | Status: AC
  Administered 2015-12-24: 3.375 g via INTRAVENOUS
  Filled 2015-12-24: qty 50

## 2015-12-24 MED ORDER — VANCOMYCIN HCL 10 G IV SOLR
1250.0000 mg | INTRAVENOUS | Status: DC
  Administered 2015-12-25 – 2015-12-26 (×2): 1250 mg via INTRAVENOUS
  Filled 2015-12-24 (×3): qty 1250

## 2015-12-24 MED ORDER — HYDROCODONE-ACETAMINOPHEN 5-325 MG PO TABS
1.0000 | ORAL_TABLET | Freq: Four times a day (QID) | ORAL | Status: DC | PRN
  Administered 2015-12-25 – 2015-12-27 (×6): 1 via ORAL
  Filled 2015-12-24 (×6): qty 1

## 2015-12-24 NOTE — H&P (Signed)
History and Physical    [name] [id number] DOB: [birth year] DOA: 12/24/2015  PCP: Rachell Cipro, MD  Patient coming from: Home  Chief Complaint: RLE pain, redness, fever, chills for 1 day  HPI: [name] is a 65 y.o. male with medical history significant of hypertension, type 2 diabetes with neuropathy, coronary artery disease status post CABG, hyperlipidemia, hypothyroidism, diabetic foot ulcer status post left fifth toe amputation, presented with sudden onset of right lower extremity pain, redness, swelling, fever and chills since yesterday. Patient reported that he was traveling to [place] where he had a fall 3 days ago. He did not have any problem for 2 days however, yesterday he started feeling chills, fever and sudden onset of right lower extremity redness, swelling and pain. He was diaphoretic. Today, he flew from [place] to come here.  He did not measure his temperature however reported having fever and chills. He reported that he tripped and had a fall. He did not pass out. He stated that he might hurt his knee during the fall. Denied headache, dizziness, chest pain, shortness of breath, nausea, vomiting, abdominal pain. Denied dysuria, urgency or frequency.  ED Course: In the ER patient was found to have tachycardia, leukocytosis, and elevated lactate. He was treated for right lower extremity cellulitis with IV vancomycin and Zosyn. Blood cultures were ordered. X-ray consistent with soft tissue swelling without abscess or gas. The patient also received IV morphine for pain management. Admitted for further evaluation.  Review of Systems: As per HPI otherwise 10 point review of systems negative.   Review of systems positive for subjective fever, chills, right lower extremity pain and swelling and redness. Denied headache, dizziness, lightheadedness, chest pain, shortness of breath, nausea, vomiting, abdominal pain, dysuria, urgency, frequency, constipation or diarrhea.  The  patient's wife at bedside in the ER.   Past Medical History:  Diagnosis Date  . Coronary artery disease   . Diabetes mellitus without complication ([place])   . Hypercholesteremia   . Hypertension   . Neuropathy [facility])     Past Surgical History:  Procedure Laterality Date  . AMPUTATION Left 12/25/2013   Procedure: AMPUTATION RAY LEFT 5TH RAY;  Surgeon: [name], MD;  Location: [facility];  Service: Orthopedics;  Laterality: Left;  . BACK SURGERY    . CHOLECYSTECTOMY    . CORONARY ARTERY BYPASS GRAFT      Social History: reports that he quit smoking about 27 years ago. His smoking use included Cigars. He has never used smokeless tobacco. He reports that he does not drink alcohol or use drugs.  Allergies  Allergen Reactions  . Mushroom Extract Complex Nausea Only    Family History  Problem Relation Age of Onset  . Diabetes Mellitus II Mother   . Diabetes Mellitus II Father   . CAD Father   Reviewed   Prior to Admission medications   Medication Sig Start Date End Date Taking? Authorizing Provider  allopurinol (ZYLOPRIM) 300 MG tablet Take 300 mg by mouth daily.    Historical Provider, MD  amLODipine-benazepril (LOTREL) 5-10 MG per capsule Take 1 capsule by mouth daily.    Historical Provider, MD  aspirin EC 81 MG tablet Take 81 mg by mouth daily.    Historical Provider, MD  carvedilol (COREG) 3.125 MG tablet Take 3.125 mg by mouth 2 (two) times daily with a meal.  12/03/13   Historical Provider, MD  CHROMIUM PO Take 1 tablet by mouth daily.    Historical Provider, MD  CRESTOR 20 MG tablet Take 20 mg by mouth daily.  10/29/13   Historical Provider, MD  DEXILANT 60 MG capsule Take 60 mg by mouth daily.  12/24/13   Historical Provider, MD  ezetimibe (ZETIA) 10 MG tablet Take 10 mg by mouth daily.    Historical Provider, MD  gabapentin (NEURONTIN) 600 MG tablet Take 1,200 mg by mouth 3 (three) times daily.    Historical Provider, MD  hydrochlorothiazide (HYDRODIURIL) 25 MG tablet Take  25 mg by mouth daily.    Historical Provider, MD  HYDROcodone-acetaminophen (NORCO) 5-325 MG per tablet Take 1 tablet by mouth every 6 (six) hours as needed for moderate pain. 12/27/13   Verlee Monte, MD  levothyroxine (SYNTHROID, LEVOTHROID) 88 MCG tablet Take 88 mcg by mouth daily before breakfast.    Historical Provider, MD  linagliptin (TRADJENTA) 5 MG TABS tablet Take 5 mg by mouth daily.    Historical Provider, MD  MAGNESIUM PO Take 1 tablet by mouth daily.    Historical Provider, MD  metformin (FORTAMET) 1000 MG (OSM) 24 hr tablet Take 1,000 mg by mouth 2 (two) times daily with a meal.  11/29/13   Historical Provider, MD  Omega-3 Fatty Acids (FISH OIL) 1200 MG CAPS Take 1,200 mg by mouth 2 (two) times daily.    Historical Provider, MD  Probiotic Product (PROBIOTIC PO) Take 1 capsule by mouth daily.    Historical Provider, MD  ranitidine (ZANTAC) 150 MG tablet Take 1 tablet (150 mg total) by mouth 2 (two) times daily. 12/28/14   Gloriann Loan, PA-C  Saw Palmetto, Serenoa repens, (SAW PALMETTO PO) Take 1 tablet by mouth daily.    Historical Provider, MD  tadalafil (CIALIS) 20 MG tablet Take 20 mg by mouth daily as needed for erectile dysfunction.    Historical Provider, MD  VOLTAREN 1 % GEL Apply 2 g topically 4 (four) times daily as needed (pain).  11/29/13   Historical Provider, MD    Physical Exam: Vitals:   12/24/15 1304 12/24/15 1534 12/24/15 1735 12/24/15 1745  BP: 169/79 143/74 157/75 145/70  Pulse: 108 103 119 111  Resp: 20 18 17 25   Temp: 98.1 F (36.7 C)     TempSrc: Oral     SpO2: 99% 98% 94% 95%      Constitutional: NAD, calm, comfortable Vitals:   12/24/15 1304 12/24/15 1534 12/24/15 1735 12/24/15 1745  BP: 169/79 143/74 157/75 145/70  Pulse: 108 103 119 111  Resp: 20 18 17 25   Temp: 98.1 F (36.7 C)     TempSrc: Oral     SpO2: 99% 98% 94% 95%   Eyes: PERRL, lids and conjunctivae normal ENMT: Mucous membranes are dry. Normal dentition.  Neck: normal,  supple Respiratory: clear to auscultation bilaterally, no wheezing, no crackles. Normal respiratory effort. No accessory muscle use.  Cardiovascular: Regular rate and rhythm, no murmurs / rubs / gallops.  Abdomen: Soft, no tenderness, Bowel sounds positive.  Musculoskeletal: no clubbing / cyanosis. No joint deformity upper and lower extremities. Good ROM, no contractures. Normal muscle tone.  Right lower extremity has redness, tenderness, swelling. Bilateral dorsalis pedis pulse palpable. Warm to touch. Skin: Erythematous rash on right lower extremity and a skin tenderness. Neurologic: CN 2-12 grossly intact. Strength 5/5 in all 4.  Psychiatric: Normal judgment and insight. Alert and oriented x 3. Normal mood.   Labs on Admission: I have personally reviewed following labs and imaging studies  CBC:  Recent Labs Lab 12/24/15 1311  WBC 12.4*  NEUTROABS 11.0*  HGB 14.1  HCT 41.6  MCV 87.6  PLT 433*   Basic Metabolic Panel:  Recent Labs Lab 12/24/15 1311  NA 134*  K 4.7  CL 108  CO2 17*  GLUCOSE 148*  BUN 41*  CREATININE 2.75*  CALCIUM 8.3*   GFR: CrCl cannot be calculated (Unknown ideal weight.). Liver Function Tests:  Recent Labs Lab 12/24/15 1311  AST 38  ALT 24  ALKPHOS 60  BILITOT 1.1  PROT 6.1*  ALBUMIN 3.7   No results for input(s): LIPASE, AMYLASE in the last 168 hours. No results for input(s): AMMONIA in the last 168 hours. Coagulation Profile: No results for input(s): INR, PROTIME in the last 168 hours. Cardiac Enzymes: No results for input(s): CKTOTAL, CKMB, CKMBINDEX, TROPONINI in the last 168 hours. BNP (last 3 results) No results for input(s): PROBNP in the last 8760 hours. HbA1C: No results for input(s): HGBA1C in the last 72 hours. CBG: No results for input(s): GLUCAP in the last 168 hours. Lipid Profile: No results for input(s): CHOL, HDL, LDLCALC, TRIG, CHOLHDL, LDLDIRECT in the last 72 hours. Thyroid Function Tests: No results for  input(s): TSH, T4TOTAL, FREET4, T3FREE, THYROIDAB in the last 72 hours. Anemia Panel: No results for input(s): VITAMINB12, FOLATE, FERRITIN, TIBC, IRON, RETICCTPCT in the last 72 hours. Urine analysis:    Component Value Date/Time   COLORURINE YELLOW 12/28/2014 1031   APPEARANCEUR CLEAR 12/28/2014 1031   LABSPEC 1.030 12/28/2014 1031   PHURINE 5.0 12/28/2014 1031   GLUCOSEU >1000 (A) 12/28/2014 1031   HGBUR SMALL (A) 12/28/2014 1031   BILIRUBINUR NEGATIVE 12/28/2014 1031   KETONESUR NEGATIVE 12/28/2014 1031   PROTEINUR >300 (A) 12/28/2014 1031   UROBILINOGEN 0.2 12/28/2014 1031   NITRITE NEGATIVE 12/28/2014 1031   LEUKOCYTESUR NEGATIVE 12/28/2014 1031    Radiological Exams on Admission: Dg Chest 2 View  Result Date: 12/24/2015 CLINICAL DATA:  Fever and chills that began yesterday, shaking began today, fell 2 days ago, history diabetes mellitus, hypertension, coronary artery disease post CABG EXAM: CHEST  2 VIEW COMPARISON:  04/06/2015 FINDINGS: Upper normal heart size post CABG. Mediastinal contours and pulmonary vascularity normal. Lungs clear. No pleural effusion or pneumothorax. Scattered endplate spur formation thoracic spine. IMPRESSION: No acute abnormalities. Electronically Signed   By: Lavonia Dana M.D.   On: 12/24/2015 14:37   Dg Tibia/fibula Right  Result Date: 12/24/2015 CLINICAL DATA:  Cellulitis.  Evaluate for soft tissue gas. EXAM: RIGHT TIBIA AND FIBULA - 2 VIEW COMPARISON:  None. FINDINGS: There is diffuse soft tissue swelling, with no appreciable soft tissue gas. No radiopaque foreign body. Vascular calcifications throughout the soft tissues. No fracture or suspicious focal osseous lesion. No cortical erosions. There is spurring at the proximal right fibular shaft laterally. Small to moderate enthesophytes are seen at the superior and inferior right patella. No evidence of malalignment at the right knee or right ankle on the provided views. IMPRESSION: Diffuse soft tissue  swelling, with no appreciable soft tissue gas. No fracture Electronically Signed   By: Ilona Sorrel M.D.   On: 12/24/2015 17:23   Dg Ankle Complete Right  Result Date: 12/24/2015 CLINICAL DATA:  Cellulitis.  Evaluate for soft tissue gas. EXAM: RIGHT ANKLE - COMPLETE 3+ VIEW COMPARISON:  None. FINDINGS: Diffuse soft tissue swelling. No fracture, subluxation or suspicious focal osseous lesion. No cortical erosions or periosteal reaction. Mild osteoarthritis in the right ankle joint. Bulky Achilles right calcaneal enthesophyte. No appreciable soft tissue gas. Vascular calcifications throughout the soft  tissues. IMPRESSION: Diffuse right ankle soft tissue swelling, with no appreciable soft tissue gas. No fracture or malalignment. Mild right ankle joint osteoarthritis. Electronically Signed   By: Ilona Sorrel M.D.   On: 12/24/2015 17:21    EKG: Independently reviewed. Sinus tachycardia.  Assessment/Plan  # Cellulitis of leg, right, likely early sepsis: (Tachycardia, leukocytosis, elevated lactate, fever etc). Patient has diabetes with neuropathy and recent fall might have triggered the cellulitis. No sign of open wound noticed. -X-ray of right lower extremity consistent with soft tissue swelling, cellulitis. No sign of fracture or gas. No open wound on physical examination. Continue IV vancomycin and Zosyn. Follow-up blood culture result. -Pharmacy was referred to doze with IV antibiotics. -Vascular Doppler ultrasound of lower extremity was ordered in the ER to rule out DVT. Follow-up results. -Continue pain management with IV morphine and oral pain medication.  #Type 2 diabetes with neuropathy: Lower the dose of Neurontin according to GFR. Checking HbA1c. Monitor blood sugar level. Control hyperglycemia with correctional dose insulin. Holding metformin and oral hypoglycemic agent. Consider discontinuing metformin on discharge given renal failure.  #Acute on chronic kidney disease stage III: Likely  contributed by infection on top of  ACE inhibitor and hydrochlorothiazide. Presented with serum creatinine level of 2.7 with baseline serum creatinine level around 2.2. Check urinalysis. Holding both medication this time. Patient looked dehydrated therefore receiving IV hydration. Monitor BMP closely. Avoid nephrotoxins. Patient follows up with his nephrologist outpatient.  # [facility] HTN (hypertension): Monitor blood pressure closely. I will continue amlodipine 5 mg and Coreg. Holding benazepril and hydrochlorothiazide because of worsening renal failure.  # Hyperlipidemia: Continue statin.  #History of CAD (coronary artery disease): Denied chest pain or shortness of breath. Continue aspirin, Coreg, starting. Advised outpatient follow-up.    #Hypothyroidism: Continue Synthroid.  Plan discussed with the patient and his wife at bedside in the ER. Patient agreed with the current plan of care.  DVT prophylaxis: Heparin subcutaneous. Code Status: Full code Family Communication: The patient's wife in ER.  Disposition Plan: Likely discharge home in 2-3 days. Admission status: Inpatient because of severity of illness and requirement of IV antibiotics.   Gladie Gravette Tanna Furry MD [facility] Pager [phone]  If 7PM-7AM, please contact night-coverage [email or web address] Password [facility]  12/24/2015, 6:36 PM

## 2015-12-24 NOTE — Progress Notes (Signed)
VASCULAR LAB PRELIMINARY  PRELIMINARY  PRELIMINARY  PRELIMINARY  Right lower extremity venous duplex completed.    Preliminary report:  There is no DVT or SVT noted in the right lower extremity.  There is an enlarged inguinal lymph node noted.   Josejuan Hoaglin, RVT 12/24/2015, 6:41 PM

## 2015-12-24 NOTE — ED Notes (Signed)
Lactic acid 2.25 called from lab, Dr. Ellender Hose informed

## 2015-12-24 NOTE — ED Provider Notes (Signed)
[facility] DEPT Provider Note   CSN: [id number] Arrival date & time: 12/24/15  1257     History   Chief Complaint Chief Complaint  Patient presents with  . Leg Pain  . Chills    HPI [name] is a 65 y.o. male.  Patient presents with a three-day history of progressing redness, pain and swelling to his right leg. States he fell on the street when he was visiting in [place] 4 days ago. He landed on both knees. Then he said redness, pain and swelling to his right leg starting last night. Reports subjective fever and chills at home with shaking. Did not check his temperature. Increased pain from diabetic neuropathy. Denies any document of fevers. No chest pain or shortness of breath. No focal weakness, numbness or tingling. Has some nausea but no vomiting. Denies any breaks in the skin.   The history is provided by the patient.  Leg Pain      Past Medical History:  Diagnosis Date  . Coronary artery disease   . Diabetes mellitus without complication ([place])   . Hypercholesteremia   . Hypertension   . Neuropathy [facility])     Patient Active Problem List   Diagnosis Date Noted  . CKD (chronic kidney disease), stage III 12/25/2013  . Foot infection 12/24/2013  . Foot ulcer, left ([place]) 12/24/2013  . Cellulitis 09/14/2012  . Diabetic foot ulcer ([place]) 09/14/2012  . Gout 09/14/2012  . HTN (hypertension) 09/14/2012  . Hyperlipidemia 09/14/2012  . Hypothyroidism 09/14/2012  . CAD (coronary artery disease) 09/14/2012  . Diabetes mellitus ([place]) 09/14/2012  . Hypercholesteremia   . Neuropathy [facility])     Past Surgical History:  Procedure Laterality Date  . AMPUTATION Left 12/25/2013   Procedure: AMPUTATION RAY LEFT 5TH RAY;  Surgeon: [name], MD;  Location: [facility] ORS;  Service: Orthopedics;  Laterality: Left;  . BACK SURGERY    . CHOLECYSTECTOMY    . CORONARY ARTERY BYPASS GRAFT         Home Medications    Prior to Admission medications   Medication Sig Start Date  End Date Taking? Authorizing Provider  allopurinol (ZYLOPRIM) 300 MG tablet Take 300 mg by mouth daily.    Historical Provider, MD  amLODipine-benazepril (LOTREL) 5-10 MG per capsule Take 1 capsule by mouth daily.    Historical Provider, MD  aspirin EC 81 MG tablet Take 81 mg by mouth daily.    Historical Provider, MD  carvedilol (COREG) 3.125 MG tablet Take 3.125 mg by mouth 2 (two) times daily with a meal.  12/03/13   Historical Provider, MD  CHROMIUM PO Take 1 tablet by mouth daily.    Historical Provider, MD  CRESTOR 20 MG tablet Take 20 mg by mouth daily.  10/29/13   Historical Provider, MD  DEXILANT 60 MG capsule Take 60 mg by mouth daily.  12/24/13   Historical Provider, MD  ezetimibe (ZETIA) 10 MG tablet Take 10 mg by mouth daily.    Historical Provider, MD  gabapentin (NEURONTIN) 600 MG tablet Take 1,200 mg by mouth 3 (three) times daily.    Historical Provider, MD  hydrochlorothiazide (HYDRODIURIL) 25 MG tablet Take 25 mg by mouth daily.    Historical Provider, MD  HYDROcodone-acetaminophen (NORCO) 5-325 MG per tablet Take 1 tablet by mouth every 6 (six) hours as needed for moderate pain. 12/27/13   Verlee Monte, MD  levothyroxine (SYNTHROID, LEVOTHROID) 88 MCG tablet Take 88 mcg by mouth daily before breakfast.  Historical Provider, MD  linagliptin (TRADJENTA) 5 MG TABS tablet Take 5 mg by mouth daily.    Historical Provider, MD  MAGNESIUM PO Take 1 tablet by mouth daily.    Historical Provider, MD  metformin (FORTAMET) 1000 MG (OSM) 24 hr tablet Take 1,000 mg by mouth 2 (two) times daily with a meal.  11/29/13   Historical Provider, MD  Omega-3 Fatty Acids (FISH OIL) 1200 MG CAPS Take 1,200 mg by mouth 2 (two) times daily.    Historical Provider, MD  Probiotic Product (PROBIOTIC PO) Take 1 capsule by mouth daily.    Historical Provider, MD  ranitidine (ZANTAC) 150 MG tablet Take 1 tablet (150 mg total) by mouth 2 (two) times daily. 12/28/14   Gloriann Loan, PA-C  Saw Palmetto, Serenoa repens,  (SAW PALMETTO PO) Take 1 tablet by mouth daily.    Historical Provider, MD  tadalafil (CIALIS) 20 MG tablet Take 20 mg by mouth daily as needed for erectile dysfunction.    Historical Provider, MD  VOLTAREN 1 % GEL Apply 2 g topically 4 (four) times daily as needed (pain).  11/29/13   Historical Provider, MD    Family History Family History  Problem Relation Age of Onset  . Diabetes Mellitus II Mother   . Diabetes Mellitus II Father   . CAD Father     Social History Social History  Substance Use Topics  . Smoking status: Former Smoker    Types: Cigars    Quit date: 12/24/1988  . Smokeless tobacco: Never Used  . Alcohol use No     Allergies   Mushroom extract complex   Review of Systems Review of Systems  Constitutional: Positive for activity change, appetite change, chills, fatigue and fever.  HENT: Negative for congestion.   Respiratory: Negative for cough, chest tightness and shortness of breath.   Cardiovascular: Negative for chest pain and leg swelling.  Gastrointestinal: Negative for abdominal pain, nausea and vomiting.  Genitourinary: Negative for dysuria, hematuria and testicular pain.  Musculoskeletal: Negative for arthralgias and myalgias.  Skin: Positive for wound.  Neurological: Positive for weakness. Negative for dizziness and headaches.   A complete 10 system review of systems was obtained and all systems are negative except as noted in the HPI and PMH.    Physical Exam Updated Vital Signs BP 143/74   Pulse 103   Temp 98.1 F (36.7 C) (Oral)   Resp 18   SpO2 98%   Physical Exam  Constitutional: He is oriented to person, place, and time. He appears well-developed and well-nourished. No distress.  Shaking chills  HENT:  Head: Normocephalic and atraumatic.  Mouth/Throat: Oropharynx is clear and moist. No oropharyngeal exudate.  Eyes: Conjunctivae and EOM are normal. Pupils are equal, round, and reactive to light.  Neck: Normal range of motion. Neck  supple.  No meningismus.  Cardiovascular: Normal rate, regular rhythm, normal heart sounds and intact distal pulses.   No murmur heard. Pulmonary/Chest: Effort normal and breath sounds normal. No respiratory distress.  Abdominal: Soft. There is no tenderness. There is no rebound and no guarding.  Musculoskeletal: Normal range of motion. He exhibits edema and tenderness.  There is diffuse erythema to the right lower leg from proximal tibia to dorsal foot. It is warm and red and tender to palpation. Full range of motion of hip and ankle. Intact DP pulse. No crepitus  Abrasion to posterior left calf with some surrounding redness. Intact DP pulse    Neurological: He is alert and oriented  to person, place, and time. No cranial nerve deficit. He exhibits normal muscle tone. Coordination normal.  No ataxia on finger to nose bilaterally. No pronator drift. 5/5 strength throughout. CN 2-12 intact.Equal grip strength. Sensation intact.   Skin: Skin is warm.  Psychiatric: He has a normal mood and affect. His behavior is normal.  Nursing note and vitals reviewed.      ED Treatments / Results  Labs (all labs ordered are listed, but only abnormal results are displayed) Labs Reviewed  COMPREHENSIVE METABOLIC PANEL - Abnormal; Notable for the following:       Result Value   Sodium 134 (*)    CO2 17 (*)    Glucose, Bld 148 (*)    BUN 41 (*)    Creatinine, Ser 2.75 (*)    Calcium 8.3 (*)    Total Protein 6.1 (*)    GFR calc non Af Amer 22 (*)    GFR calc Af Amer 26 (*)    All other components within normal limits  CBC WITH DIFFERENTIAL/PLATELET - Abnormal; Notable for the following:    WBC 12.4 (*)    Platelets 135 (*)    Neutro Abs 11.0 (*)    All other components within normal limits  GLUCOSE, CAPILLARY - Abnormal; Notable for the following:    Glucose-Capillary 171 (*)    All other components within normal limits  I-STAT CG4 LACTIC ACID, ED - Abnormal; Notable for the following:     Lactic Acid, Venous 2.25 (*)    All other components within normal limits  CULTURE, BLOOD (ROUTINE X 2)  CULTURE, BLOOD (ROUTINE X 2)  URINALYSIS, ROUTINE W REFLEX MICROSCOPIC (NOT AT [facility])  HEMOGLOBIN S0F  BASIC METABOLIC PANEL  CBC  I-STAT CG4 LACTIC ACID, ED    EKG  EKG Interpretation  Date/Time:  Sunday December 24 2015 17:32:10 EDT Ventricular Rate:  109 PR Interval:    QRS Duration: 98 QT Interval:  325 QTC Calculation: 438 R Axis:   76 Text Interpretation:  Sinus tachycardia No previous ECGs available Confirmed by Wyvonnia Dusky  MD, Keshona Kartes [phone]) on 12/24/2015 5:41:18 PM       Radiology Dg Chest 2 View  Result Date: 12/24/2015 CLINICAL DATA:  Fever and chills that began yesterday, shaking began today, fell 2 days ago, history diabetes mellitus, hypertension, coronary artery disease post CABG EXAM: CHEST  2 VIEW COMPARISON:  04/06/2015 FINDINGS: Upper normal heart size post CABG. Mediastinal contours and pulmonary vascularity normal. Lungs clear. No pleural effusion or pneumothorax. Scattered endplate spur formation thoracic spine. IMPRESSION: No acute abnormalities. Electronically Signed   By: Lavonia Dana M.D.   On: 12/24/2015 14:37    Procedures Procedures (including critical care time)  Medications Ordered in ED Medications  0.9 %  sodium chloride infusion (not administered)  sodium chloride 0.9 % bolus 1,000 mL (not administered)  piperacillin-tazobactam (ZOSYN) IVPB 3.375 g (not administered)  vancomycin (VANCOCIN) IVPB 1000 mg/200 mL premix (not administered)     Initial Impression / Assessment and Plan / ED Course  I have reviewed the triage vital signs and the nursing notes.  Pertinent labs & imaging results that were available during my care of the patient were reviewed by me and considered in my medical decision making (see chart for details).  Clinical Course  Diabetic patient with rapidly progressing cellulitis of right lower extremity. Subjective fever  and chills at home. Afebrile on arrival.  Lactate and white blood cell count elevated. Patient will be started on  IV antibiotics after cultures obtained. X-ray will be obtained to rule out soft tissue gas.  Patient given IV fluids and IV antibiotics. Creatinine is 2.75 which is slightly worse than his baseline. Chest x-ray shows no evidence of heart failure  Xray shows soft tissue swelling. No fracture, no soft tissue gas.  Patient will be admitted given his immunocompromise state diabetes with likely early sepsis. Blood pressure and mental status remained stable in the ED. Doppler negative for DVT.  Admission d/w Dr. Carolin Sicks. Final Clinical Impressions(s) / ED Diagnoses   Final diagnoses:  Cellulitis of right leg    New Prescriptions New Prescriptions   No medications on file     Ezequiel Essex, MD 12/24/15 2339

## 2015-12-24 NOTE — ED Triage Notes (Signed)
Patient complains of bilateral leg pain with right leg swelling and redness since yesterday with chills. States that it started while out of state. Complains of shooting pain in feet to knee with increased neuropathy pain to hands. Alert and oriented, no fever on assessment

## 2015-12-24 NOTE — Progress Notes (Signed)
ANTIBIOTIC CONSULT NOTE - INITIAL  Pharmacy Consult for Vanco/Zosyn Indication: Cellulitis + sepsis  Allergies  Allergen Reactions  . Mushroom Extract Complex Nausea Only    Patient Measurements: Weight: 251 lb 8.7 oz (114.1 kg) Adjusted Body Weight:    Vital Signs: Temp: 99.1 F (37.3 C) (09/17 2120) Temp Source: Oral (09/17 2120) BP: 157/68 (09/17 2120) Pulse Rate: 101 (09/17 2120) Intake/Output from previous day: No intake/output data recorded. Intake/Output from this shift: Total I/O In: 2070 [I.V.:2070] Out: -   Labs:  Recent Labs  12/24/15 1311  WBC 12.4*  HGB 14.1  PLT 135*  CREATININE 2.75*   CrCl cannot be calculated (Unknown ideal weight.). No results for input(s): VANCOTROUGH, VANCOPEAK, VANCORANDOM, GENTTROUGH, GENTPEAK, GENTRANDOM, TOBRATROUGH, TOBRAPEAK, TOBRARND, AMIKACINPEAK, AMIKACINTROU, AMIKACIN in the last 72 hours.   Microbiology: No results found for this or any previous visit (from the past 720 hour(s)).  Medical History: Past Medical History:  Diagnosis Date  . Coronary artery disease   . Diabetes mellitus without complication ([place])   . Hypercholesteremia   . Hypertension   . Neuropathy ([facility])     Medications:  See med rec  Assessment: 65 y/o M with B leg pain. R leg has swelling and redness x 1 day. +fever, chills. Has tachycardia, leukocytosis, and elevated lactate. Patient is morbidly obese with known neuropathy. He reported that he tripped and had a fall. He did not pass out. He stated that he might hurt his knee during the fall.   PMH: hypertension, type 2 diabetes with neuropathy, coronary artery disease status post CABG, hyperlipidemia, hypothyroidism, diabetic foot ulcer status post left fifth toe amputation  ID: R leg cellulits with early sepsis. LA 2.25>1.89. WBC 12.4. Scr 2.75. Estimated CrCl 42.   Goal of Therapy:  Vancomycin trough level 15-20 mcg/ml  Plan:  Zosyn 3.375g IV q8hr. Vancomycin 2g IV (total load) x 1  then 1250mg  IV q24h Vanco trough after 3-5 doses at steady state.   Chriss Redel S. Alford Highland, PharmD, BCPS Clinical Staff Pharmacist Pager [phone]  Eilene Ghazi Stillinger 12/24/2015,9:49 PM

## 2015-12-24 NOTE — ED Notes (Signed)
Report attempted 

## 2015-12-25 DIAGNOSIS — L03115 Cellulitis of right lower limb: Secondary | ICD-10-CM

## 2015-12-25 LAB — GLUCOSE, CAPILLARY
GLUCOSE-CAPILLARY: 231 mg/dL — AB (ref 65–99)
GLUCOSE-CAPILLARY: 282 mg/dL — AB (ref 65–99)
Glucose-Capillary: 158 mg/dL — ABNORMAL HIGH (ref 65–99)
Glucose-Capillary: 175 mg/dL — ABNORMAL HIGH (ref 65–99)

## 2015-12-25 LAB — BASIC METABOLIC PANEL
ANION GAP: 6 (ref 5–15)
BUN: 38 mg/dL — ABNORMAL HIGH (ref 6–20)
CHLORIDE: 108 mmol/L (ref 101–111)
CO2: 22 mmol/L (ref 22–32)
CREATININE: 2.93 mg/dL — AB (ref 0.61–1.24)
Calcium: 7.6 mg/dL — ABNORMAL LOW (ref 8.9–10.3)
GFR calc non Af Amer: 21 mL/min — ABNORMAL LOW (ref >60)
GFR, EST AFRICAN AMERICAN: 24 mL/min — AB (ref >60)
Glucose, Bld: 234 mg/dL — ABNORMAL HIGH (ref 65–99)
Potassium: 4 mmol/L (ref 3.5–5.1)
SODIUM: 136 mmol/L (ref 135–145)

## 2015-12-25 LAB — CBC
HCT: 32.9 % — ABNORMAL LOW (ref 39.0–52.0)
HEMOGLOBIN: 10.5 g/dL — AB (ref 13.0–17.0)
MCH: 28.3 pg (ref 26.0–34.0)
MCHC: 31.9 g/dL (ref 30.0–36.0)
MCV: 88.7 fL (ref 78.0–100.0)
PLATELETS: 105 10*3/uL — AB (ref 150–400)
RBC: 3.71 MIL/uL — AB (ref 4.22–5.81)
RDW: 15.4 % (ref 11.5–15.5)
WBC: 9.1 10*3/uL (ref 4.0–10.5)

## 2015-12-25 LAB — URINE MICROSCOPIC-ADD ON: WBC, UA: NONE SEEN WBC/hpf (ref 0–5)

## 2015-12-25 LAB — URINALYSIS, ROUTINE W REFLEX MICROSCOPIC
BILIRUBIN URINE: NEGATIVE
Glucose, UA: NEGATIVE mg/dL
Ketones, ur: NEGATIVE mg/dL
LEUKOCYTES UA: NEGATIVE
NITRITE: NEGATIVE
PH: 5.5 (ref 5.0–8.0)
Protein, ur: >300 mg/dL — AB
SPECIFIC GRAVITY, URINE: 1.013 (ref 1.005–1.030)

## 2015-12-25 LAB — MRSA PCR SCREENING: MRSA BY PCR: POSITIVE — AB

## 2015-12-25 MED ORDER — PREGABALIN 75 MG PO CAPS
75.0000 mg | ORAL_CAPSULE | Freq: Every day | ORAL | Status: DC
  Administered 2015-12-25 – 2015-12-26 (×2): 75 mg via ORAL
  Filled 2015-12-25 (×2): qty 1

## 2015-12-25 MED ORDER — SODIUM CHLORIDE 0.9 % IV SOLN
INTRAVENOUS | Status: DC
  Administered 2015-12-25 – 2015-12-26 (×2): via INTRAVENOUS

## 2015-12-25 MED ORDER — INSULIN GLARGINE 100 UNIT/ML ~~LOC~~ SOLN
25.0000 [IU] | Freq: Every day | SUBCUTANEOUS | Status: DC
  Administered 2015-12-25 – 2015-12-26 (×2): 25 [IU] via SUBCUTANEOUS
  Filled 2015-12-25 (×3): qty 0.25

## 2015-12-25 NOTE — Care Management Note (Addendum)
Case Management Note  Patient Details  Name: [name] MRN: [id number] Date of Birth: [birth year]  Subjective/Objective:                 Patient admitted with AKI, and cellulitis to right leg. PMH DM, CKD. Receiving IVF and IV Abx. Anticipate DC in next few days as symptoms resolve.    Action/Plan:  No needs identified at this time, will continue to follow. Addendum 9-20, spoke with patient at the bedside will DC to home today, no needs.  Expected Discharge Date:                  Expected Discharge Plan:  Home/Self Care  In-House Referral:  NA  Discharge planning Services  CM Consult  Post Acute Care Choice:  NA Choice offered to:  NA  DME Arranged:    DME Agency:     HH Arranged:    HH Agency:     Status of Service:  In process, will continue to follow  If discussed at Long Length of Stay Meetings, dates discussed:    Additional Comments:  Carles Collet, RN 12/25/2015, 3:10 PM

## 2015-12-25 NOTE — Progress Notes (Signed)
PROGRESS NOTE    [name]  HLK:[id number] DOB: 1950-09-10 DOA: 12/24/2015 PCP: Rachell Cipro, MD Brief Narrative: 65/M with PMH of of hypertension, type 2 diabetes with neuropathy, coronary artery disease status post CABG, hyperlipidemia, hypothyroidism, diabetic foot ulcer status post left fifth toe amputation, presented with sudden onset of right lower extremity pain, redness, swelling, fever and chills x1day.  -FOudn to have Cellulitis and AKi, started on Abx and IVF  Assessment & Plan: 1.  Sepsis due to Cellulitis of R leg -improving, sepsis physiology resolved -lactate cleared -continue IV Vanc and ZOsyn, day 2 -X-ray of right lower extremity consistent with soft tissue swelling, cellulitis. -FU Blood Cx, pain control -Dopplers negative for DVT -elevate R leg  2. Type 2 diabetes with neuropathy -taking 1200mg  TID of Gabapentin despite CKD3, cut down dose to 300mg  TID may need further lowering if GFR doesn't improve, add QHS lyrica due to pts report of severe neuropathy -start 1/2 dose lantus and SSI, CBGS starting to trend dup -takes [place] 50units QHS at home -stop metformin at DC due to CKD  3. Acute on chronic kidney disease stage III:  -due to sepsis, with concomitant use of ARB, hydrochlorothiazide and NSAIDs -admitting creatinine 2.7 and now 2.9, baseline creatinine level around 2.2. -IVF, avoid nephrotoxins and monitor  4.  [place] HTN (hypertension) -stable, continue amlodipine 5 mg and Coreg.  -Holding benazepril and hydrochlorothiazide due to AKI  5. History of CAD (coronary artery disease) -stable, continue aspirin, Coreg, statin    6. Hypothyroidism: Continue Synthroid.  DVT prophylaxis: Heparin subcutaneous. Code Status: Full code Family Communication: No family at bedside Disposition Plan: home in few days if stable    Antimicrobials:Vanc/Zosyn  Subjective: Feels better, R leg unchanged  Objective: Vitals:   12/24/15 2000 12/24/15 2045  12/24/15 2120 12/25/15 0600  BP: 149/74  (!) 157/68 (!) 129/59  Pulse: 100  (!) 101 82  Resp: 25  (!) 22 16  Temp:  100.4 F (38 C) 99.1 F (37.3 C) 98 F (36.7 C)  TempSrc:  Oral Oral Oral  SpO2: 95%  (!) 88%   Weight:   114.1 kg (251 lb 8.7 oz) 114.1 kg (251 lb 8.7 oz)  Height:    5\' 10"  (1.778 m)    Intake/Output Summary (Last 24 hours) at 12/25/15 1330 Last data filed at 12/25/15 0602  Gross per 24 hour  Intake             2360 ml  Output              300 ml  Net             2060 ml   Filed Weights   12/24/15 2120 12/25/15 0600  Weight: 114.1 kg (251 lb 8.7 oz) 114.1 kg (251 lb 8.7 oz)    Examination:  General exam: Appears calm and comfortable  Respiratory system: Clear to auscultation. Respiratory effort normal. Cardiovascular system: S1 & S2 heard, RRR. No JVD, murmurs, rubs, gallops or clicks. 1plus edema Gastrointestinal system: Abdomen is nondistended, soft and nontender. No organomegaly or masses felt. Normal bowel sounds heard. Central nervous system: Alert and oriented. No focal neurological deficits. Extremities/Skin with erythema, edema and tenderness, overlying the R leg Psychiatry: Judgement and insight appear normal. Mood & affect appropriate.     Data Reviewed: I have personally reviewed following labs and imaging studies  CBC:  Recent Labs Lab 12/24/15 1311 12/25/15 0958  WBC 12.4* 9.1  NEUTROABS 11.0*  --   HGB  14.1 10.5*  HCT 41.6 32.9*  MCV 87.6 88.7  PLT 135* 161*   Basic Metabolic Panel:  Recent Labs Lab 12/24/15 1311 12/25/15 0958  NA 134* 136  K 4.7 4.0  CL 108 108  CO2 17* 22  GLUCOSE 148* 234*  BUN 41* 38*  CREATININE 2.75* 2.93*  CALCIUM 8.3* 7.6*   GFR: Estimated Creatinine Clearance: 31.4 mL/min (by C-G formula based on SCr of 2.93 mg/dL (H)). Liver Function Tests:  Recent Labs Lab 12/24/15 1311  AST 38  ALT 24  ALKPHOS 60  BILITOT 1.1  PROT 6.1*  ALBUMIN 3.7   No results for input(s): LIPASE, AMYLASE in  the last 168 hours. No results for input(s): AMMONIA in the last 168 hours. Coagulation Profile: No results for input(s): INR, PROTIME in the last 168 hours. Cardiac Enzymes: No results for input(s): CKTOTAL, CKMB, CKMBINDEX, TROPONINI in the last 168 hours. BNP (last 3 results) No results for input(s): PROBNP in the last 8760 hours. HbA1C: No results for input(s): HGBA1C in the last 72 hours. CBG:  Recent Labs Lab 12/24/15 2131 12/25/15 0837 12/25/15 1157  GLUCAP 171* 158* 231*   Lipid Profile: No results for input(s): CHOL, HDL, LDLCALC, TRIG, CHOLHDL, LDLDIRECT in the last 72 hours. Thyroid Function Tests: No results for input(s): TSH, T4TOTAL, FREET4, T3FREE, THYROIDAB in the last 72 hours. Anemia Panel: No results for input(s): VITAMINB12, FOLATE, FERRITIN, TIBC, IRON, RETICCTPCT in the last 72 hours. Urine analysis:    Component Value Date/Time   COLORURINE YELLOW 12/24/2015 0112   APPEARANCEUR CLOUDY (A) 12/24/2015 0112   LABSPEC 1.013 12/24/2015 0112   PHURINE 5.5 12/24/2015 0112   GLUCOSEU NEGATIVE 12/24/2015 0112   HGBUR SMALL (A) 12/24/2015 0112   BILIRUBINUR NEGATIVE 12/24/2015 0112   KETONESUR NEGATIVE 12/24/2015 0112   PROTEINUR >300 (A) 12/24/2015 0112   UROBILINOGEN 0.2 12/28/2014 1031   NITRITE NEGATIVE 12/24/2015 0112   LEUKOCYTESUR NEGATIVE 12/24/2015 0112   Sepsis Labs: @LABRCNTIP (procalcitonin:4,lacticidven:4)  ) Recent Results (from the past 240 hour(s))  MRSA PCR Screening     Status: Abnormal   Collection Time: 12/25/15  6:36 AM  Result Value Ref Range Status   MRSA by PCR POSITIVE (A) NEGATIVE Final    Comment:        The GeneXpert MRSA Assay (FDA approved for NASAL specimens only), is one component of a comprehensive MRSA colonization surveillance program. It is not intended to diagnose MRSA infection nor to guide or monitor treatment for MRSA infections. RESULT CALLED TO, READ BACK BY AND VERIFIED WITH: K. PRICE RN 10:00 12/25/15  ([email or web address])          Radiology Studies: Dg Chest 2 View  Result Date: 12/24/2015 CLINICAL DATA:  Fever and chills that began yesterday, shaking began today, fell 2 days ago, history diabetes mellitus, hypertension, coronary artery disease post CABG EXAM: CHEST  2 VIEW COMPARISON:  04/06/2015 FINDINGS: Upper normal heart size post CABG. Mediastinal contours and pulmonary vascularity normal. Lungs clear. No pleural effusion or pneumothorax. Scattered endplate spur formation thoracic spine. IMPRESSION: No acute abnormalities. Electronically Signed   By: Lavonia Dana M.D.   On: 12/24/2015 14:37   Dg Tibia/fibula Right  Result Date: 12/24/2015 CLINICAL DATA:  Cellulitis.  Evaluate for soft tissue gas. EXAM: RIGHT TIBIA AND FIBULA - 2 VIEW COMPARISON:  None. FINDINGS: There is diffuse soft tissue swelling, with no appreciable soft tissue gas. No radiopaque foreign body. Vascular calcifications throughout the soft tissues. No fracture or suspicious focal osseous lesion.  No cortical erosions. There is spurring at the proximal right fibular shaft laterally. Small to moderate enthesophytes are seen at the superior and inferior right patella. No evidence of malalignment at the right knee or right ankle on the provided views. IMPRESSION: Diffuse soft tissue swelling, with no appreciable soft tissue gas. No fracture Electronically Signed   By: Ilona Sorrel M.D.   On: 12/24/2015 17:23   Dg Ankle Complete Right  Result Date: 12/24/2015 CLINICAL DATA:  Cellulitis.  Evaluate for soft tissue gas. EXAM: RIGHT ANKLE - COMPLETE 3+ VIEW COMPARISON:  None. FINDINGS: Diffuse soft tissue swelling. No fracture, subluxation or suspicious focal osseous lesion. No cortical erosions or periosteal reaction. Mild osteoarthritis in the right ankle joint. Bulky Achilles right calcaneal enthesophyte. No appreciable soft tissue gas. Vascular calcifications throughout the soft tissues. IMPRESSION: Diffuse right ankle soft tissue  swelling, with no appreciable soft tissue gas. No fracture or malalignment. Mild right ankle joint osteoarthritis. Electronically Signed   By: Ilona Sorrel M.D.   On: 12/24/2015 17:21        Scheduled Meds: . acidophilus   Oral Daily  . allopurinol  300 mg Oral Daily  . amLODipine  5 mg Oral Daily  . aspirin EC  81 mg Oral Daily  . carvedilol  3.125 mg Oral BID WC  . ezetimibe  10 mg Oral Daily  . gabapentin  300 mg Oral TID  . heparin  5,000 Units Subcutaneous Q8H  . insulin aspart  0-5 Units Subcutaneous QHS  . insulin aspart  0-9 Units Subcutaneous TID WC  . levothyroxine  88 mcg Oral QAC breakfast  . pantoprazole  40 mg Oral Daily  . piperacillin-tazobactam (ZOSYN)  IV  3.375 g Intravenous Q8H  . rosuvastatin  20 mg Oral Daily  . sodium chloride flush  3 mL Intravenous Q12H  . vancomycin  1,250 mg Intravenous Q24H   Continuous Infusions: . sodium chloride 100 mL/hr at 12/25/15 0955     LOS: 1 day    Time spent: 60min    Domenic Polite, MD [facility] Pager [phone]  If 7PM-7AM, please contact night-coverage [email or web address] Password [email or web address] 12/25/2015, 1:30 PM

## 2015-12-25 NOTE — Progress Notes (Signed)
Pt arrived floor, [id number]. Pt oriented to room and settled in to start the care. Will continue to monitor.

## 2015-12-26 LAB — BASIC METABOLIC PANEL
Anion gap: 7 (ref 5–15)
BUN: 25 mg/dL — AB (ref 6–20)
CALCIUM: 8.1 mg/dL — AB (ref 8.9–10.3)
CHLORIDE: 108 mmol/L (ref 101–111)
CO2: 23 mmol/L (ref 22–32)
CREATININE: 2.66 mg/dL — AB (ref 0.61–1.24)
GFR calc Af Amer: 27 mL/min — ABNORMAL LOW (ref >60)
GFR calc non Af Amer: 23 mL/min — ABNORMAL LOW (ref >60)
GLUCOSE: 148 mg/dL — AB (ref 65–99)
Potassium: 3.8 mmol/L (ref 3.5–5.1)
Sodium: 138 mmol/L (ref 135–145)

## 2015-12-26 LAB — CBC
HCT: 35 % — ABNORMAL LOW (ref 39.0–52.0)
Hemoglobin: 11.1 g/dL — ABNORMAL LOW (ref 13.0–17.0)
MCH: 28.2 pg (ref 26.0–34.0)
MCHC: 31.7 g/dL (ref 30.0–36.0)
MCV: 88.8 fL (ref 78.0–100.0)
PLATELETS: 127 10*3/uL — AB (ref 150–400)
RBC: 3.94 MIL/uL — ABNORMAL LOW (ref 4.22–5.81)
RDW: 15 % (ref 11.5–15.5)
WBC: 8.4 10*3/uL (ref 4.0–10.5)

## 2015-12-26 LAB — GLUCOSE, CAPILLARY
GLUCOSE-CAPILLARY: 130 mg/dL — AB (ref 65–99)
GLUCOSE-CAPILLARY: 185 mg/dL — AB (ref 65–99)
Glucose-Capillary: 248 mg/dL — ABNORMAL HIGH (ref 65–99)
Glucose-Capillary: 140 mg/dL — ABNORMAL HIGH (ref 65–99)

## 2015-12-26 LAB — HEMOGLOBIN A1C
HEMOGLOBIN A1C: 6 % — AB (ref 4.8–5.6)
Mean Plasma Glucose: 126 mg/dL

## 2015-12-26 MED ORDER — CARVEDILOL 6.25 MG PO TABS
6.2500 mg | ORAL_TABLET | Freq: Two times a day (BID) | ORAL | Status: DC
  Administered 2015-12-26 – 2015-12-27 (×2): 6.25 mg via ORAL
  Filled 2015-12-26 (×2): qty 1

## 2015-12-26 MED ORDER — LEVOTHYROXINE SODIUM 112 MCG PO TABS
112.0000 ug | ORAL_TABLET | Freq: Every day | ORAL | Status: DC
  Administered 2015-12-26 – 2015-12-27 (×2): 112 ug via ORAL
  Filled 2015-12-26 (×2): qty 1

## 2015-12-26 MED ORDER — INSULIN ASPART 100 UNIT/ML ~~LOC~~ SOLN
0.0000 [IU] | Freq: Three times a day (TID) | SUBCUTANEOUS | Status: DC
  Administered 2015-12-26: 2 [IU] via SUBCUTANEOUS
  Administered 2015-12-26: 5 [IU] via SUBCUTANEOUS
  Administered 2015-12-27: 3 [IU] via SUBCUTANEOUS

## 2015-12-26 MED ORDER — INSULIN ASPART 100 UNIT/ML ~~LOC~~ SOLN: 0.0000 [IU] | Freq: Every day | SUBCUTANEOUS | Status: DC

## 2015-12-26 NOTE — Progress Notes (Signed)
PROGRESS NOTE    [name]  RWE:[id number] DOB: July 31, 1950 DOA: 12/24/2015 PCP: Rachell Cipro, MD Brief Narrative: 65/M with PMH of of hypertension, type 2 diabetes with neuropathy, coronary artery disease status post CABG, hyperlipidemia, hypothyroidism, diabetic foot ulcer status post left fifth toe amputation, presented with sudden onset of right lower extremity pain, redness, swelling, fever and chills x1day.  -Found to have Cellulitis and AKi, started on Abx and IVF  Assessment & Plan: 1.  Sepsis due to Cellulitis of R leg -improving, sepsis physiology resolved -lactate cleared -continue IV Vanc and ZOsyn, day 2 -X-ray of right lower extremity consistent with soft tissue swelling, cellulitis. -Blood Cx-NGTD, pain control -Dopplers negative for DVT -elevate R leg -change to PO Abx in 1-2days  2. Type 2 diabetes with neuropathy -taking 1200mg  TID of Gabapentin despite CKD3, cut down dose to 300mg  TID may need further lowering if GFR doesn't improve, add QHS lyrica due to pts report of severe neuropathy -start 1/2 dose lantus and SSI, CBGS starting to trend dup -takes [place] 50units QHS at home -stop metformin at DC due to CKD  3. Acute on chronic kidney disease stage III:  -due to sepsis, with concomitant use of ARB, hydrochlorothiazide and NSAIDs -admitting creatinine 2.7 and now 2.9, baseline creatinine level around 2.2. -improving, will stop IVF  4.  [place] HTN (hypertension) -stable, continue amlodipine 5 mg and Coreg.  -Holding benazepril and hydrochlorothiazide due to AKI  5. History of CAD (coronary artery disease) -stable, continue aspirin, Coreg, statin    6. Hypothyroidism: Continue Synthroid.  DVT prophylaxis: Heparin subcutaneous. Code Status: Full code Family Communication: No family at bedside Disposition Plan: home in 1-2days    Antimicrobials:Vanc/Zosyn  Subjective: Feels better, R leg unchanged  Objective: Vitals:   12/25/15 1428  12/25/15 2204 12/26/15 0632 12/26/15 1427  BP: (!) 129/52 (!) 147/77 (!) 157/78 (!) 141/70  Pulse: 89 92 82 76  Resp: 18 18 18 18   Temp: 98.8 F (37.1 C) 98.8 F (37.1 C) 98.2 F (36.8 C) 98 F (36.7 C)  TempSrc: Oral Oral Oral Oral  SpO2: 98% 95% 96% 96%  Weight:      Height:        Intake/Output Summary (Last 24 hours) at 12/26/15 1432 Last data filed at 12/26/15 0533  Gross per 24 hour  Intake          2603.33 ml  Output              900 ml  Net          1703.33 ml   Filed Weights   12/24/15 2120 12/25/15 0600  Weight: 114.1 kg (251 lb 8.7 oz) 114.1 kg (251 lb 8.7 oz)    Examination:  General exam: Appears calm and comfortable  Respiratory system: Clear to auscultation. Respiratory effort normal. Cardiovascular system: S1 & S2 heard, RRR. No JVD, murmurs, rubs, gallops or clicks. 1plus edema Gastrointestinal system: Abdomen is nondistended, soft and nontender. No organomegaly or masses felt. Normal bowel sounds heard. Central nervous system: Alert and oriented. No focal neurological deficits. Extremities/Skin with erythema, edema and tenderness, overlying the R leg Psychiatry: Judgement and insight appear normal. Mood & affect appropriate.     Data Reviewed: I have personally reviewed following labs and imaging studies  CBC:  Recent Labs Lab 12/24/15 1311 12/25/15 0958 12/26/15 0837  WBC 12.4* 9.1 8.4  NEUTROABS 11.0*  --   --   HGB 14.1 10.5* 11.1*  HCT 41.6 32.9* 35.0*  MCV  87.6 88.7 88.8  PLT 135* 105* 209*   Basic Metabolic Panel:  Recent Labs Lab 12/24/15 1311 12/25/15 0958 12/26/15 0837  NA 134* 136 138  K 4.7 4.0 3.8  CL 108 108 108  CO2 17* 22 23  GLUCOSE 148* 234* 148*  BUN 41* 38* 25*  CREATININE 2.75* 2.93* 2.66*  CALCIUM 8.3* 7.6* 8.1*   GFR: Estimated Creatinine Clearance: 34.5 mL/min (by C-G formula based on SCr of 2.66 mg/dL (H)). Liver Function Tests:  Recent Labs Lab 12/24/15 1311  AST 38  ALT 24  ALKPHOS 60  BILITOT  1.1  PROT 6.1*  ALBUMIN 3.7   No results for input(s): LIPASE, AMYLASE in the last 168 hours. No results for input(s): AMMONIA in the last 168 hours. Coagulation Profile: No results for input(s): INR, PROTIME in the last 168 hours. Cardiac Enzymes: No results for input(s): CKTOTAL, CKMB, CKMBINDEX, TROPONINI in the last 168 hours. BNP (last 3 results) No results for input(s): PROBNP in the last 8760 hours. HbA1C:  Recent Labs  12/24/15 2131  HGBA1C 6.0*   CBG:  Recent Labs Lab 12/25/15 0837 12/25/15 1157 12/25/15 1645 12/25/15 2201 12/26/15 0815  GLUCAP 158* 231* 175* 282* 130*   Lipid Profile: No results for input(s): CHOL, HDL, LDLCALC, TRIG, CHOLHDL, LDLDIRECT in the last 72 hours. Thyroid Function Tests: No results for input(s): TSH, T4TOTAL, FREET4, T3FREE, THYROIDAB in the last 72 hours. Anemia Panel: No results for input(s): VITAMINB12, FOLATE, FERRITIN, TIBC, IRON, RETICCTPCT in the last 72 hours. Urine analysis:    Component Value Date/Time   COLORURINE YELLOW 12/24/2015 0112   APPEARANCEUR CLOUDY (A) 12/24/2015 0112   LABSPEC 1.013 12/24/2015 0112   PHURINE 5.5 12/24/2015 0112   GLUCOSEU NEGATIVE 12/24/2015 0112   HGBUR SMALL (A) 12/24/2015 0112   BILIRUBINUR NEGATIVE 12/24/2015 0112   KETONESUR NEGATIVE 12/24/2015 0112   PROTEINUR >300 (A) 12/24/2015 0112   UROBILINOGEN 0.2 12/28/2014 1031   NITRITE NEGATIVE 12/24/2015 0112   LEUKOCYTESUR NEGATIVE 12/24/2015 0112   Sepsis Labs: @LABRCNTIP (procalcitonin:4,lacticidven:4)  ) Recent Results (from the past 240 hour(s))  Blood culture (routine x 2)     Status: None (Preliminary result)   Collection Time: 12/24/15  4:11 PM  Result Value Ref Range Status   Specimen Description BLOOD RIGHT ANTECUBITAL  Final   Special Requests BOTTLES DRAWN AEROBIC AND ANAEROBIC 5ML  Final   Culture NO GROWTH 2 DAYS  Final   Report Status PENDING  Incomplete  Blood culture (routine x 2)     Status: None (Preliminary  result)   Collection Time: 12/24/15  4:16 PM  Result Value Ref Range Status   Specimen Description BLOOD LEFT ANTECUBITAL  Final   Special Requests IN PEDIATRIC BOTTLE 4ML  Final   Culture NO GROWTH 2 DAYS  Final   Report Status PENDING  Incomplete  MRSA PCR Screening     Status: Abnormal   Collection Time: 12/25/15  6:36 AM  Result Value Ref Range Status   MRSA by PCR POSITIVE (A) NEGATIVE Final    Comment:        The GeneXpert MRSA Assay (FDA approved for NASAL specimens only), is one component of a comprehensive MRSA colonization surveillance program. It is not intended to diagnose MRSA infection nor to guide or monitor treatment for MRSA infections. RESULT CALLED TO, READ BACK BY AND VERIFIED WITH: K. PRICE RN 10:00 12/25/15 ([email or web address])          Radiology Studies: Dg Tibia/fibula Right  Result Date:  12/24/2015 CLINICAL DATA:  Cellulitis.  Evaluate for soft tissue gas. EXAM: RIGHT TIBIA AND FIBULA - 2 VIEW COMPARISON:  None. FINDINGS: There is diffuse soft tissue swelling, with no appreciable soft tissue gas. No radiopaque foreign body. Vascular calcifications throughout the soft tissues. No fracture or suspicious focal osseous lesion. No cortical erosions. There is spurring at the proximal right fibular shaft laterally. Small to moderate enthesophytes are seen at the superior and inferior right patella. No evidence of malalignment at the right knee or right ankle on the provided views. IMPRESSION: Diffuse soft tissue swelling, with no appreciable soft tissue gas. No fracture Electronically Signed   By: Ilona Sorrel M.D.   On: 12/24/2015 17:23   Dg Ankle Complete Right  Result Date: 12/24/2015 CLINICAL DATA:  Cellulitis.  Evaluate for soft tissue gas. EXAM: RIGHT ANKLE - COMPLETE 3+ VIEW COMPARISON:  None. FINDINGS: Diffuse soft tissue swelling. No fracture, subluxation or suspicious focal osseous lesion. No cortical erosions or periosteal reaction. Mild osteoarthritis in the  right ankle joint. Bulky Achilles right calcaneal enthesophyte. No appreciable soft tissue gas. Vascular calcifications throughout the soft tissues. IMPRESSION: Diffuse right ankle soft tissue swelling, with no appreciable soft tissue gas. No fracture or malalignment. Mild right ankle joint osteoarthritis. Electronically Signed   By: Ilona Sorrel M.D.   On: 12/24/2015 17:21        Scheduled Meds: . acidophilus   Oral Daily  . allopurinol  300 mg Oral Daily  . amLODipine  5 mg Oral Daily  . aspirin EC  81 mg Oral Daily  . carvedilol  6.25 mg Oral BID WC  . ezetimibe  10 mg Oral Daily  . gabapentin  300 mg Oral TID  . heparin  5,000 Units Subcutaneous Q8H  . insulin aspart  0-15 Units Subcutaneous TID WC  . insulin aspart  0-5 Units Subcutaneous QHS  . insulin glargine  25 Units Subcutaneous QHS  . levothyroxine  112 mcg Oral QAC breakfast  . pantoprazole  40 mg Oral Daily  . piperacillin-tazobactam (ZOSYN)  IV  3.375 g Intravenous Q8H  . pregabalin  75 mg Oral QHS  . rosuvastatin  20 mg Oral Daily  . sodium chloride flush  3 mL Intravenous Q12H  . vancomycin  1,250 mg Intravenous Q24H   Continuous Infusions:     LOS: 2 days    Time spent: 45min    Domenic Polite, MD [facility] Pager [phone]  If 7PM-7AM, please contact night-coverage [email or web address] Password [email or web address] 12/26/2015, 2:32 PM

## 2015-12-26 NOTE — Progress Notes (Signed)
Inpatient Diabetes Program Recommendations  AACE/ADA: New Consensus Statement on Inpatient Glycemic Control (2015)  Target Ranges:  Prepandial:   less than 140 mg/dL      Peak postprandial:   less than 180 mg/dL (1-2 hours)      Critically ill patients:  140 - 180 mg/dL   Lab Results  Component Value Date   GLUCAP 130 (H) 12/26/2015   HGBA1C 6.0 (H) 12/24/2015    Review of Glycemic Control  Diabetes history: DM 2 Outpatient Diabetes medications: Tresiba 50 units QHS Current orders for Inpatient glycemic control: Lantus 25 units QHS, Novolog Sensitive TID + HS scale   A1c 6% on 12/24/15  Inpatient Diabetes Program Recommendations:   Glucose trends upward during the day. Consider increasing correction to Novolog Moderate correction TID.  Thanks,  Tama Headings RN, MSN, [facility] Inpatient Diabetes Coordinator Team Pager [phone] (8a-5p)

## 2015-12-27 LAB — CBC
HEMATOCRIT: 34.3 % — AB (ref 39.0–52.0)
Hemoglobin: 11.3 g/dL — ABNORMAL LOW (ref 13.0–17.0)
MCH: 28.3 pg (ref 26.0–34.0)
MCHC: 32.9 g/dL (ref 30.0–36.0)
MCV: 86 fL (ref 78.0–100.0)
Platelets: 159 10*3/uL (ref 150–400)
RBC: 3.99 MIL/uL — ABNORMAL LOW (ref 4.22–5.81)
RDW: 14.7 % (ref 11.5–15.5)
WBC: 7 10*3/uL (ref 4.0–10.5)

## 2015-12-27 LAB — BASIC METABOLIC PANEL
Anion gap: 10 (ref 5–15)
BUN: 21 mg/dL — AB (ref 6–20)
CO2: 21 mmol/L — ABNORMAL LOW (ref 22–32)
CREATININE: 2.52 mg/dL — AB (ref 0.61–1.24)
Calcium: 8.6 mg/dL — ABNORMAL LOW (ref 8.9–10.3)
Chloride: 107 mmol/L (ref 101–111)
GFR calc Af Amer: 29 mL/min — ABNORMAL LOW (ref >60)
GFR, EST NON AFRICAN AMERICAN: 25 mL/min — AB (ref >60)
GLUCOSE: 124 mg/dL — AB (ref 65–99)
POTASSIUM: 3.7 mmol/L (ref 3.5–5.1)
Sodium: 138 mmol/L (ref 135–145)

## 2015-12-27 LAB — GLUCOSE, CAPILLARY
Glucose-Capillary: 163 mg/dL — ABNORMAL HIGH (ref 65–99)
Glucose-Capillary: 232 mg/dL — ABNORMAL HIGH (ref 65–99)

## 2015-12-27 MED ORDER — HYDROCODONE-ACETAMINOPHEN 5-325 MG PO TABS: 1.0000 | ORAL_TABLET | Freq: Four times a day (QID) | ORAL | 0 refills | Status: DC | PRN

## 2015-12-27 MED ORDER — GABAPENTIN 600 MG PO TABS: 300.0000 mg | ORAL_TABLET | Freq: Three times a day (TID) | ORAL | Status: DC

## 2015-12-27 MED ORDER — DOXYCYCLINE HYCLATE 50 MG PO CAPS: 100.0000 mg | ORAL_CAPSULE | Freq: Two times a day (BID) | ORAL | 0 refills | Status: DC

## 2015-12-27 MED ORDER — PREGABALIN 75 MG PO CAPS: 75.0000 mg | ORAL_CAPSULE | Freq: Every day | ORAL | 0 refills | Status: DC

## 2015-12-27 MED ORDER — INSULIN DEGLUDEC 200 UNIT/ML ~~LOC~~ SOPN: 40.0000 [IU] | PEN_INJECTOR | Freq: Every day | SUBCUTANEOUS | Status: DC

## 2015-12-27 NOTE — Care Management Important Message (Signed)
Important Message  Patient Details  Name: [name] MRN: [id number] Date of Birth: Aug 22, 1950   Medicare Important Message Given:  Yes    Carles Collet, RN 12/27/2015, 10:03 AM

## 2015-12-27 NOTE — Progress Notes (Signed)
Pt given discharge instructions, prescriptions, and care notes. Pt verbalized understanding AEB no further questions or concerns at this time. IV was discontinued, no redness, pain, or swelling noted at this time. Telemetry discontinued and Centralized Telemetry was notified. Pt left the floor via wheelchair with staff in stable condition. 

## 2015-12-27 NOTE — Progress Notes (Signed)
Pharmacy Antibiotic Note  [name] is a 65 y.o. male admitted on 12/24/2015 with sepsis.  Pharmacy has been consulted for Zosyn and vancomycin dosing.  Day #4 of abx for R leg cellulits with early sepsis. Afebrile, WBC wnl. Scr 2.52, somewhat near baseline. Estimated CrCl 35 ml/min   Plan: Continue Zosyn 3.375 gm IV q8h (4 hour infusion) Continue vancomycin 1,250mg  IV Q24 Monitor clinical picture, renal function F/U abx deescalation / LOT  Provider may transition to PO abx today  Height: 5\' 10"  (177.8 cm) Weight: 251 lb 8.7 oz (114.1 kg) IBW/kg (Calculated) : 73  Temp (24hrs), Avg:98.6 F (37 C), Min:98 F (36.7 C), Max:99.1 F (37.3 C)   Recent Labs Lab 12/24/15 1311 12/24/15 1330 12/24/15 1745 12/25/15 0958 12/26/15 0837 12/27/15 0703 12/27/15 0706  WBC 12.4*  --   --  9.1 8.4 7.0  --   CREATININE 2.75*  --   --  2.93* 2.66*  --  2.52*  LATICACIDVEN  --  2.25* 1.89  --   --   --   --     Estimated Creatinine Clearance: 36.5 mL/min (by C-G formula based on SCr of 2.52 mg/dL (H)).    Allergies  Allergen Reactions  . Mushroom Extract Complex Nausea Only     Thank you for allowing pharmacy to be a part of this patient's care.  Elenor Quinones, PharmD, BCPS Clinical Pharmacist Pager [phone] 12/27/2015 1:28 PM

## 2015-12-29 LAB — CULTURE, BLOOD (ROUTINE X 2)
CULTURE: NO GROWTH
Culture: NO GROWTH

## 2016-01-01 NOTE — Discharge Summary (Signed)
Physician Discharge Summary  [name] CWU:[id number] DOB: [birth year] DOA: 12/24/2015  PCP: Rachell Cipro, MD  Admit date: 12/24/2015 Discharge date: 12/27/2015  Time spent:45 minutes  Recommendations for Outpatient Follow-up:  1. Dr.Dewey in 1-2weeks, please check Bet at FU, changed gabapentin dose due to CKD4 2. FU with Dr.Upton at [facility]  Discharge Diagnoses:    Acute kidney injury superimposed on CKD ([place])   Cellulitis of right lower extremity   Neuropathy (HCC)   HTN (hypertension)   Hyperlipidemia   CAD (coronary artery disease)   Cellulitis of leg, right     Discharge Condition: stable  Diet recommendation: diabetic, heart healthy  Filed Weights   12/24/15 2120 12/25/15 0600  Weight: 114.1 kg (251 lb 8.7 oz) 114.1 kg (251 lb 8.7 oz)    History of present illness:  65/M with PMH of of hypertension, type 2 diabetes with neuropathy, coronary artery disease status post CABG, hyperlipidemia, hypothyroidism, diabetic foot ulcer status post left fifth toe amputation, presented with sudden onset of right lower extremity pain, redness, swelling, fever and chills x1day  Hospital Course:  1.  Sepsis due to Cellulitis of R leg -improved, sepsis physiology resolved -initially treated with IV Vanc and ZOsyn,  -X-ray of right lower extremity was consistent with soft tissue swelling, cellulitis. -Blood Cx-NGTD, Dopplers negative for DVT -clinically improved and transitioned to Oral DOxycycline at discharge, advised FU with PCP in 1 week, to reassess leg and FU Bmet  2. Type 2 diabetes with neuropathy -taking 1200mg  TID of Gabapentin despite CKD3, cut down dose to 300mg  TID and added low dose QHS lyrica due to pts report of severe neuropathy -resumed Insulin at lower dose than home regimen -takes [place] 50units QHS at home -stopped metformin at DC due to CKD  3. Acute on chronic kidney disease stage III: -due to sepsis, with concomitant use of ARB,  hydrochlorothiazide and NSAIDs -creatinine was 2.9, baseline creatinine level around 2.2. -improved to 2.5 at discharge, followed by Dr.Upton at [place], will need FU labs in 1 week and ARB and NSAIDs stopped  4.  EssentiaHTN (hypertension) -stable, continue amlodipine 5 mg and Coreg.  -Stopped benazepril  due to AKI  5. History ofCAD (coronary artery disease) -stable, continue aspirin, Coreg, statin  6. Hypothyroidism:Continue Synthroid  Discharge Exam: Vitals:   12/26/15 1427 12/26/15 2227  BP: (!) 141/70 (!) 154/76  Pulse: 76 81  Resp: 18 18  Temp: 98 F (36.7 C) 99.1 F (37.3 C)    General: AAOx3 Cardiovascular: S1S2/RRR Respiratory: CTAB  Discharge Instructions   Discharge Instructions    Diet - low sodium heart healthy    Complete by:  As directed    Diet Carb Modified    Complete by:  As directed    Increase activity slowly    Complete by:  As directed      Discharge Medication List as of 12/27/2015 12:02 PM    START taking these medications   Details  doxycycline (VIBRAMYCIN) 50 MG capsule Take 2 capsules (100 mg total) by mouth 2 (two) times daily. For 5days, do not take this on an empty stomach, Starting Wed 12/27/2015, Print      CONTINUE these medications which have CHANGED   Details  gabapentin (NEURONTIN) 600 MG tablet Take 0.5 tablets (300 mg total) by mouth 3 (three) times daily. Take 2 tablets (1200 mg) by mouth every morning and at night, may also take 2 tablets mid-day as needed for nerve pain, Starting Wed 12/27/2015, No Print  HYDROcodone-acetaminophen (NORCO) 5-325 MG tablet Take 1 tablet by mouth every 6 (six) hours as needed for moderate pain., Starting Wed 12/27/2015, Print    Insulin Degludec (TRESIBA FLEXTOUCH) 200 UNIT/ML SOPN Inject 40 Units into the skin at bedtime., Starting Wed 12/27/2015, No Print    pregabalin (LYRICA) 75 MG capsule Take 1 capsule (75 mg total) by mouth at bedtime., Starting Wed 12/27/2015, Print      CONTINUE  these medications which have NOT CHANGED   Details  allopurinol (ZYLOPRIM) 300 MG tablet Take 300 mg by mouth daily., Until Discontinued, Historical Med    amLODipine (NORVASC) 10 MG tablet Take 10 mg by mouth at bedtime., Starting Fri 11/03/2015, Historical Med    aspirin EC 81 MG tablet Take 81 mg by mouth at bedtime. , Historical Med    Carboxymethylcellul-Glycerin (REFRESH OPTIVE) 1-0.9 % GEL Place 1 application into both eyes 2 (two) times daily., Historical Med    carvedilol (COREG) 6.25 MG tablet Take 6.25 mg by mouth 2 (two) times daily., Starting Fri 10/27/2015, Historical Med    diclofenac sodium (VOLTAREN) 1 % GEL Apply 1 application topically daily., Historical Med    Esomeprazole Magnesium (NEXIUM PO) Take 22.3 mg by mouth daily., Historical Med    ezetimibe (ZETIA) 10 MG tablet Take 10 mg by mouth daily., Historical Med    levothyroxine (SYNTHROID, LEVOTHROID) 112 MCG tablet Take 112 mcg by mouth daily before breakfast., Historical Med    OVER THE COUNTER MEDICATION Take 1 tablet by mouth See admin instructions. Take 1 tablet by mouth every 2 hours during the night as needed for leg cramps: Hyland's Leg Cramps PM, Historical Med    tadalafil (CIALIS) 5 MG tablet Take 5 mg by mouth daily as needed for erectile dysfunction., Historical Med    triamcinolone (KENALOG) 0.025 % cream Apply 1 application topically daily as needed (psoriasis)., Historical Med      STOP taking these medications     ibuprofen (ADVIL,MOTRIN) 200 MG tablet      losartan (COZAAR) 25 MG tablet      CRESTOR 20 MG tablet      DEXILANT 60 MG capsule      Probiotic Product (PROBIOTIC PO)      ranitidine (ZANTAC) 150 MG tablet        Allergies  Allergen Reactions  . Mushroom Extract Complex Nausea Only   Follow-up Information    DEWEY,ELIZABETH, MD. Schedule an appointment as soon as possible for a visit on 01/02/2016.   Specialty:  Family Medicine Why:  Labs-Bmet in 1 week// Appointment  with Dr. Ernie Hew is on 01/02/16 at [place] information: [address] [place] [address] [phone]            The results of significant diagnostics from this hospitalization (including imaging, microbiology, ancillary and laboratory) are listed below for reference.    Significant Diagnostic Studies: Dg Chest 2 View  Result Date: 12/24/2015 CLINICAL DATA:  Fever and chills that began yesterday, shaking began today, fell 2 days ago, history diabetes mellitus, hypertension, coronary artery disease post CABG EXAM: CHEST  2 VIEW COMPARISON:  04/06/2015 FINDINGS: Upper normal heart size post CABG. Mediastinal contours and pulmonary vascularity normal. Lungs clear. No pleural effusion or pneumothorax. Scattered endplate spur formation thoracic spine. IMPRESSION: No acute abnormalities. Electronically Signed   By: Lavonia Dana M.D.   On: 12/24/2015 14:37   Dg Tibia/fibula Right  Result Date: 12/24/2015 CLINICAL DATA:  Cellulitis.  Evaluate for soft  tissue gas. EXAM: RIGHT TIBIA AND FIBULA - 2 VIEW COMPARISON:  None. FINDINGS: There is diffuse soft tissue swelling, with no appreciable soft tissue gas. No radiopaque foreign body. Vascular calcifications throughout the soft tissues. No fracture or suspicious focal osseous lesion. No cortical erosions. There is spurring at the proximal right fibular shaft laterally. Small to moderate enthesophytes are seen at the superior and inferior right patella. No evidence of malalignment at the right knee or right ankle on the provided views. IMPRESSION: Diffuse soft tissue swelling, with no appreciable soft tissue gas. No fracture Electronically Signed   By: Ilona Sorrel M.D.   On: 12/24/2015 17:23   Dg Ankle Complete Right  Result Date: 12/24/2015 CLINICAL DATA:  Cellulitis.  Evaluate for soft tissue gas. EXAM: RIGHT ANKLE - COMPLETE 3+ VIEW COMPARISON:  None. FINDINGS: Diffuse soft tissue swelling. No fracture, subluxation or suspicious focal  osseous lesion. No cortical erosions or periosteal reaction. Mild osteoarthritis in the right ankle joint. Bulky Achilles right calcaneal enthesophyte. No appreciable soft tissue gas. Vascular calcifications throughout the soft tissues. IMPRESSION: Diffuse right ankle soft tissue swelling, with no appreciable soft tissue gas. No fracture or malalignment. Mild right ankle joint osteoarthritis. Electronically Signed   By: Ilona Sorrel M.D.   On: 12/24/2015 17:21    Microbiology: Recent Results (from the past 240 hour(s))  Blood culture (routine x 2)     Status: None   Collection Time: 12/24/15  4:11 PM  Result Value Ref Range Status   Specimen Description BLOOD RIGHT ANTECUBITAL  Final   Special Requests BOTTLES DRAWN AEROBIC AND ANAEROBIC 5ML  Final   Culture NO GROWTH 5 DAYS  Final   Report Status 12/29/2015 FINAL  Final  Blood culture (routine x 2)     Status: None   Collection Time: 12/24/15  4:16 PM  Result Value Ref Range Status   Specimen Description BLOOD LEFT ANTECUBITAL  Final   Special Requests IN PEDIATRIC BOTTLE 4ML  Final   Culture NO GROWTH 5 DAYS  Final   Report Status 12/29/2015 FINAL  Final  MRSA PCR Screening     Status: Abnormal   Collection Time: 12/25/15  6:36 AM  Result Value Ref Range Status   MRSA by PCR POSITIVE (A) NEGATIVE Final    Comment:        The GeneXpert MRSA Assay (FDA approved for NASAL specimens only), is one component of a comprehensive MRSA colonization surveillance program. It is not intended to diagnose MRSA infection nor to guide or monitor treatment for MRSA infections. RESULT CALLED TO, READ BACK BY AND VERIFIED WITH: K. PRICE RN 10:00 12/25/15 ([email or web address])      Labs: Basic Metabolic Panel:  Recent Labs Lab 12/26/15 0837 12/27/15 0706  NA 138 138  K 3.8 3.7  CL 108 107  CO2 23 21*  GLUCOSE 148* 124*  BUN 25* 21*  CREATININE 2.66* 2.52*  CALCIUM 8.1* 8.6*   Liver Function Tests: No results for input(s): AST, ALT, ALKPHOS,  BILITOT, PROT, ALBUMIN in the last 168 hours. No results for input(s): LIPASE, AMYLASE in the last 168 hours. No results for input(s): AMMONIA in the last 168 hours. CBC:  Recent Labs Lab 12/26/15 0837 12/27/15 0703  WBC 8.4 7.0  HGB 11.1* 11.3*  HCT 35.0* 34.3*  MCV 88.8 86.0  PLT 127* 159   Cardiac Enzymes: No results for input(s): CKTOTAL, CKMB, CKMBINDEX, TROPONINI in the last 168 hours. BNP: BNP (last 3 results) No results for input(s): BNP  in the last 8760 hours.  ProBNP (last 3 results) No results for input(s): PROBNP in the last 8760 hours.  CBG:  Recent Labs Lab 12/26/15 1220 12/26/15 1731 12/26/15 2223 12/27/15 0836 12/27/15 1145  GLUCAP 248* 140* 185* 163* 232*       Signed:  Laterrica Libman MD.  [facility] 01/01/2016, 1:15 PM

## 2016-04-04 ENCOUNTER — Encounter (INDEPENDENT_AMBULATORY_CARE_PROVIDER_SITE_OTHER): Payer: Self-pay | Admitting: Orthopedic Surgery

## 2016-04-04 ENCOUNTER — Ambulatory Visit (INDEPENDENT_AMBULATORY_CARE_PROVIDER_SITE_OTHER): Payer: Managed Care, Other (non HMO) | Admitting: Family

## 2016-04-04 VITALS — Ht 70.0 in | Wt 251.0 lb

## 2016-04-04 DIAGNOSIS — L97511 Non-pressure chronic ulcer of other part of right foot limited to breakdown of skin: Secondary | ICD-10-CM | POA: Insufficient documentation

## 2016-04-04 DIAGNOSIS — M79632 Pain in left forearm: Secondary | ICD-10-CM | POA: Diagnosis not present

## 2016-04-04 NOTE — Progress Notes (Signed)
Office Visit Note   Patient: [name]           Date of Birth: 1950/12/05           MRN: [id number] Visit Date: 04/04/2016              Requested by: Fanny Bien, MD [address] [name], [place] [address] PCP: Rachell Cipro, MD   Assessment & Plan: Visit Diagnoses:  1. Non-pressure chronic ulcer of other part of right foot limited to breakdown of skin ([place])   2. Left forearm pain     Plan: He will cleanse the wound daily. Apply antibacterial ointment and dressing daily. Minimize weight bearing on right foot until wound healed. Will complete the course of antibiotics. For the left forearm pain have provided him with a tennis elbow brace. Recommended he cut back on weight lifting. Rest. nonsteroidals for pain.   Follow-Up Instructions: Return in about 2 weeks (around 04/18/2016).   Orders:  No orders of the defined types were placed in this encounter.  No orders of the defined types were placed in this encounter.     Procedures: No procedures performed   Clinical Data: No additional findings.   Subjective: Chief Complaint  Patient presents with  . Left Foot - Follow-up    Patient is a 65 year old gentleman seen today for evaluation of two separate issues.   1. right foot wound, lateral side with callus and spot bloody drain. He was seen by his PCP, Dr. Willette Pa several days ago with pain to the right foot and fever and chills, wound was cultured. there is not a report for review. He was placed on cipro, currently completing this course.   He states that this area has been on his foot "for a while" and is able to narrow it down to roughly one year. He is full weight bearing without a dressing to the area and in regular shoe wear.   2. Left forearm pain which has been ongoing for about 6 months. Is well localized and point tender. Pain with weight lifting which patient states he does daily. Though this was initially related to a rotator cuff repair which was  done about 6 months ago. Has persisted. Denies burning, numbness or tingling pain. Pain at rest and with use.     Review of Systems  Constitutional: Negative for chills and fever.     Objective: Vital Signs: Ht 5\' 10"  (1.778 m)   Wt 251 lb (113.9 kg)   BMI 36.01 kg/m   Physical Exam  Constitutional: He is oriented to person, place, and time. He appears well-developed and well-nourished.  Pulmonary/Chest: Effort normal.  Musculoskeletal:       Right forearm: He exhibits tenderness. He exhibits no bony tenderness and no swelling.       Arms: Right foot: There is ulceration over the base of the 5th metatarsal which is 15 mm in diameter. There is surrounding maceration. This was pared with a 10 blade knife back to viable tissue. There is no drainage. No surrounding erythema. No sign of infection.    Tender in muscle belly. Non tender over medial and lateral epicondyles.  Neurological: He is alert and oriented to person, place, and time.  Psychiatric: He has a normal mood and affect.  Nursing note reviewed.   Ortho Exam  Specialty Comments:  No specialty comments available.  Imaging: No results found.   PMFS History: Patient Active Problem List   Diagnosis  Date Noted  . Non-pressure chronic ulcer of other part of right foot limited to breakdown of skin ([place]) 04/04/2016  . Cellulitis of leg, right 12/24/2015  . Acute kidney injury superimposed on CKD ([place]) 12/24/2015  . Cellulitis of right lower extremity 12/24/2015  . CKD (chronic kidney disease), stage III 12/25/2013  . Foot infection 12/24/2013  . Foot ulcer, left ([place]) 12/24/2013  . Cellulitis 09/14/2012  . Diabetic foot ulcer ([place]) 09/14/2012  . Gout 09/14/2012  . HTN (hypertension) 09/14/2012  . Hyperlipidemia 09/14/2012  . Hypothyroidism 09/14/2012  . CAD (coronary artery disease) 09/14/2012  . Diabetes mellitus ([place]) 09/14/2012  . Hypercholesteremia   . Neuropathy [facility])    Past Medical History:    Diagnosis Date  . Coronary artery disease   . Diabetes mellitus without complication ([place])   . Hypercholesteremia   . Hypertension   . Neuropathy ([place])     Family History  Problem Relation Age of Onset  . Diabetes Mellitus II Mother   . Diabetes Mellitus II Father   . CAD Father     Past Surgical History:  Procedure Laterality Date  . AMPUTATION Left 12/25/2013   Procedure: AMPUTATION RAY LEFT 5TH RAY;  Surgeon: [name], MD;  Location: [facility];  Service: Orthopedics;  Laterality: Left;  . BACK SURGERY    . CHOLECYSTECTOMY    . CORONARY ARTERY BYPASS GRAFT     Social History   Occupational History  . Not on file.   Social History Main Topics  . Smoking status: Former Smoker    Types: Cigars    Quit date: 12/24/1988  . Smokeless tobacco: Never Used  . Alcohol use No  . Drug use: No  . Sexual activity: Yes    Partners: Female

## 2016-04-05 ENCOUNTER — Other Ambulatory Visit (INDEPENDENT_AMBULATORY_CARE_PROVIDER_SITE_OTHER): Payer: Self-pay | Admitting: Family

## 2016-04-12 ENCOUNTER — Other Ambulatory Visit (INDEPENDENT_AMBULATORY_CARE_PROVIDER_SITE_OTHER): Payer: Self-pay | Admitting: Family

## 2016-04-12 ENCOUNTER — Telehealth (INDEPENDENT_AMBULATORY_CARE_PROVIDER_SITE_OTHER): Payer: Self-pay | Admitting: Orthopedic Surgery

## 2016-04-12 MED ORDER — DOXYCYCLINE HYCLATE 50 MG PO CAPS: 100.0000 mg | ORAL_CAPSULE | Freq: Two times a day (BID) | ORAL | 0 refills | Status: DC

## 2016-04-12 NOTE — Telephone Encounter (Signed)
I called patient left voicemail stating we received culture results from PCP. +staph, sensitivity to cipro. Advised he needs to dc cipro and start taking doxycycline. This was sent to pharmacy [facility]

## 2016-05-13 ENCOUNTER — Encounter (HOSPITAL_BASED_OUTPATIENT_CLINIC_OR_DEPARTMENT_OTHER): Payer: Managed Care, Other (non HMO) | Attending: Internal Medicine

## 2016-05-30 ENCOUNTER — Other Ambulatory Visit: Payer: Self-pay | Admitting: Urology

## 2016-05-30 DIAGNOSIS — C61 Malignant neoplasm of prostate: Secondary | ICD-10-CM

## 2016-06-07 ENCOUNTER — Encounter: Payer: Self-pay | Admitting: Radiation Oncology

## 2016-06-10 ENCOUNTER — Encounter: Payer: Self-pay | Admitting: Radiation Oncology

## 2016-06-10 ENCOUNTER — Ambulatory Visit (HOSPITAL_COMMUNITY)
Admission: RE | Admit: 2016-06-10 | Discharge: 2016-06-10 | Disposition: A | Discharge status: Home / Self Care | Payer: Managed Care, Other (non HMO) | Source: Ambulatory Visit | Attending: Urology | Admitting: Urology

## 2016-06-10 ENCOUNTER — Encounter (HOSPITAL_COMMUNITY)
Admission: RE | Admit: 2016-06-10 | Discharge: 2016-06-10 | Disposition: A | Discharge status: Home / Self Care | Payer: Managed Care, Other (non HMO) | Source: Ambulatory Visit | Attending: Urology | Admitting: Urology

## 2016-06-10 DIAGNOSIS — C61 Malignant neoplasm of prostate: Secondary | ICD-10-CM

## 2016-06-10 MED ORDER — TECHNETIUM TC 99M MEDRONATE IV KIT
21.4000 | PACK | Freq: Once | INTRAVENOUS | Status: AC | PRN
  Administered 2016-06-10: 21.4 via INTRAVENOUS

## 2016-06-17 ENCOUNTER — Encounter: Payer: Self-pay | Admitting: Radiation Oncology

## 2016-06-17 NOTE — Progress Notes (Addendum)
GU Location of Tumor / Histology: prostatic adenocarcinoma   If Prostate Cancer, Gleason Score is (4 + 3) and PSA is (4.5) on 02/13/16. Then, PSA on 04/26/16 was lower at 3.14. Prostate volume: 100 cc.   [name] was referred by Dr. Rachell Cipro to Dr. [name] January 2018 for evaluation of an elevated PSA.   Biopsies of prostate (if applicable) revealed:    Past/Anticipated interventions by urology, if any: biopsy, bone scan (negative), discussion about ADT,  referral to radiation oncology to discuss external beam plus seed boost.  Past/Anticipated interventions by medical oncology, if any: no  Weight changes, if any: no  Wt Readings from Last 3 Encounters:  06/20/16 240 lb (108.9 kg)  04/04/16 251 lb (113.9 kg)  12/25/15 251 lb 8.7 oz (114.1 kg)    Bowel/Bladder complaints, if any: nocturia x 5   Nausea/Vomiting, if any: no  Pain issues, if any: Left forearm 4-10 had  shoulder surgery in the past   SAFETY ISSUES:  Prior radiation? No  Pacemaker/ICD? No  Possible current pregnancy? no  Is the patient on methotrexate? No  Current Complaints / other details:  66 year old male. Works as an Programme researcher, broadcasting/film/video. Married. Father with hx of prostate ca. NKDA.  BP 140/65   Pulse 71   Temp 98.3 F (36.8 C) (Oral)   Resp 18   Ht 5\' 10"  (1.778 m)   Wt 240 lb (108.9 kg)   SpO2 99%   BMI 34.44 kg/m

## 2016-06-19 DIAGNOSIS — C61 Malignant neoplasm of prostate: Secondary | ICD-10-CM | POA: Insufficient documentation

## 2016-06-19 NOTE — Progress Notes (Signed)
Radiation Oncology         [phone] ________________________________  Initial outpatient Consultation  Name: [name] MRN: [id number]  Date: 06/20/2016  DOB: 10-Jul-1950  OV:ZCHYI,FOYDXAJOI, MD  [name], MD   REFERRING PHYSICIAN: [name], MD  DIAGNOSIS: 66 y.o. gentleman with Stage T2a adenocarcinoma of the prostate with Gleason Score of 4+3=7, and PSA of 3.14.    ICD-9-CM ICD-10-CM   1. Prostate cancer ([place]) 185 C61     HISTORY OF PRESENT ILLNESS: [name] is a 66 y.o. male with a diagnosis of prostate cancer. He was noted to have an elevated PSA of 4.5 by his primary care physician, Dr. Rachell Cipro.  Accordingly, he was referred for evaluation in urology by Dr. [name] on 04/26/16,  digital rectal examination was performed at that time revealing a firm right lobe.  Repeat PSA 04/26/16 was 3.14.  The patient proceeded to transrectal ultrasound with 12 biopsies of the prostate on 05/22/16.  The prostate volume measured 99.88 cc.  Out of 12 core biopsies,4 were positive.  The maximum Gleason score was 4+3=7, and this was seen in left base, right base and right base lateral.  The patient reviewed the biopsy results with his urologist and he has kindly been referred today for discussion of potential radiation treatment options.      PREVIOUS RADIATION THERAPY: No  PAST MEDICAL HISTORY:  Past Medical History:  Diagnosis Date  . Coronary artery disease   . Diabetes mellitus without complication ([place])   . Hypercholesteremia   . Hypertension   . Neuropathy ([place])   . Prostate cancer ([place])       PAST SURGICAL HISTORY: Past Surgical History:  Procedure Laterality Date  . AMPUTATION Left 12/25/2013   Procedure: AMPUTATION RAY LEFT 5TH RAY;  Surgeon: [name], MD;  Location: [facility];  Service: Orthopedics;  Laterality: Left;  . BACK SURGERY    . CHOLECYSTECTOMY    . CORONARY ARTERY BYPASS GRAFT      FAMILY HISTORY:  Family History  Problem  Relation Age of Onset  . Diabetes Mellitus II Mother   . Diabetes Mellitus II Father   . CAD Father   . Cancer Father     prostate    SOCIAL HISTORY:  Social History   Social History  . Marital status: Married    Spouse name: N/A  . Number of children: N/A  . Years of education: N/A   Occupational History  . Not on file.   Social History Main Topics  . Smoking status: Former Smoker    Types: Cigars    Quit date: 12/24/1988  . Smokeless tobacco: Never Used  . Alcohol use No  . Drug use: No  . Sexual activity: Yes    Partners: Female   Other Topics Concern  . Not on file   Social History Narrative   Regular exercise: yes 3 times a week   Caffeine use: hot tea    ALLERGIES: Mushroom extract complex  MEDICATIONS:  Current Outpatient Prescriptions  Medication Sig Dispense Refill  . allopurinol (ZYLOPRIM) 300 MG tablet Take 300 mg by mouth daily.    Marland Kitchen amLODipine (NORVASC) 10 MG tablet Take 10 mg by mouth at bedtime.    Marland Kitchen aspirin EC 81 MG tablet Take 81 mg by mouth at bedtime.     . carvedilol (COREG) 6.25 MG tablet Take 6.25 mg by mouth 2 (two) times daily.    . diclofenac sodium (VOLTAREN) 1 % GEL Apply 1  application topically daily.    . Esomeprazole Magnesium (NEXIUM PO) Take 22.3 mg by mouth daily.    Marland Kitchen ezetimibe (ZETIA) 10 MG tablet Take 10 mg by mouth daily.    . Insulin Degludec (TRESIBA FLEXTOUCH) 200 UNIT/ML SOPN Inject 40 Units into the skin at bedtime.    Marland Kitchen levothyroxine (SYNTHROID, LEVOTHROID) 112 MCG tablet Take 112 mcg by mouth daily before breakfast.    . OVER THE COUNTER MEDICATION Take 1 tablet by mouth See admin instructions. Take 1 tablet by mouth every 2 hours during the night as needed for leg cramps: Hyland's Leg Cramps PM    . pregabalin (LYRICA) 75 MG capsule Take 1 capsule (75 mg total) by mouth at bedtime. 30 capsule 0  . tadalafil (CIALIS) 5 MG tablet Take 5 mg by mouth daily as needed for erectile dysfunction.    . triamcinolone (KENALOG)  0.025 % cream Apply 1 application topically daily as needed (psoriasis).     No current facility-administered medications for this encounter.     REVIEW OF SYSTEMS:  On review of systems, the patient reports that he is doing well overall. He denies any chest pain, shortness of breath, cough, fevers, chills, night sweats, unintended weight changes. He denies any bowel disturbances, and denies abdominal pain, nausea or vomiting. He reports chronic swelling of his lower extremities. He reports pain in the left forearm 4 out of 10 on pain scale and had shoulder surgery in the past. His IPSS was 10, indicating mild-moderate urinary symptoms. He reports nocturia x3 and urinary frequency. He has ED which responds to Cialis.  A complete review of systems is obtained and is otherwise negative.   The patient travels intermittently which may interrupt therapy.   PHYSICAL EXAM:  Wt Readings from Last 3 Encounters:  06/20/16 240 lb (108.9 kg)  04/04/16 251 lb (113.9 kg)  12/25/15 251 lb 8.7 oz (114.1 kg)   Temp Readings from Last 3 Encounters:  06/20/16 98.3 F (36.8 C) (Oral)  12/28/14 97.6 F (36.4 C) (Oral)   BP Readings from Last 3 Encounters:  06/20/16 140/65  12/26/15 (!) 154/76  12/28/14 128/70   Pulse Readings from Last 3 Encounters:  06/20/16 71  12/26/15 81  12/28/14 72   Pain Assessment Pain Score: 4  (Left forearm)/10  In general this is a well appearing caucasian male in no acute distress. He is alert and oriented x4 and appropriate throughout the examination. HEENT reveals that the patient is normocephalic, atraumatic. EOMs are intact. PERRLA. Skin is intact without any evidence of gross lesions. Cardiovascular exam reveals a regular rate and rhythm, no clicks rubs or murmurs are auscultated. Chest is clear to auscultation bilaterally. Lymphatic assessment is performed and does not reveal any adenopathy in the cervical, supraclavicular, axillary, or inguinal chains. Abdomen has  active bowel sounds in all quadrants and is intact. The abdomen is soft, non tender, non distended. Lower extremities are negative for pretibial pitting edema, deep calf tenderness, cyanosis or clubbing.   KPS = 100  100 - Normal; no complaints; no evidence of disease. 90   - Able to carry on normal activity; minor signs or symptoms of disease. 80   - Normal activity with effort; some signs or symptoms of disease. 31   - Cares for self; unable to carry on normal activity or to do active work. 60   - Requires occasional assistance, but is able to care for most of his personal needs. 50   - Requires considerable  assistance and frequent medical care. 57   - Disabled; requires special care and assistance. 73   - Severely disabled; hospital admission is indicated although death not imminent. 32   - Very sick; hospital admission necessary; active supportive treatment necessary. 10   - Moribund; fatal processes progressing rapidly. 0     - Dead  Karnofsky DA, Abelmann [place], Craver LS and Burchenal [facility] [phone]) The use of the nitrogen mustards in the palliative treatment of carcinoma: with particular reference to bronchogenic carcinoma Cancer 1 634-56  LABORATORY DATA:  Lab Results  Component Value Date   WBC 7.0 12/27/2015   HGB 11.3 (L) 12/27/2015   HCT 34.3 (L) 12/27/2015   MCV 86.0 12/27/2015   PLT 159 12/27/2015   Lab Results  Component Value Date   NA 138 12/27/2015   K 3.7 12/27/2015   CL 107 12/27/2015   CO2 21 (L) 12/27/2015   Lab Results  Component Value Date   ALT 24 12/24/2015   AST 38 12/24/2015   ALKPHOS 60 12/24/2015   BILITOT 1.1 12/24/2015     RADIOGRAPHY: Nm Bone Scan Whole Body  Result Date: 06/10/2016 CLINICAL DATA:  Prostate cancer EXAM: NUCLEAR MEDICINE WHOLE BODY BONE SCAN TECHNIQUE: Whole body anterior and posterior images were obtained approximately 3 hours after intravenous injection of radiopharmaceutical. RADIOPHARMACEUTICALS:  21.4 mCi Technetium-54m MDP IV  COMPARISON:  CT scan abdomen and pelvis 06/10/2016 FINDINGS: A whole-body bone scan shows homogeneous uptake of the tracer within bony structures without evidence of metastatic disease. Mild increased activity bilateral shoulders, bilateral SI joints, upper lumbar spine and bilateral sternoclavicular joints probable degenerative in nature. IMPRESSION: No evidence of bony metastatic disease. Probable degenerative changes as described above. Electronically Signed   By: Lahoma Crocker M.D.   On: 06/10/2016 16:41      IMPRESSION/PLAN: 1. 66 y.o. gentleman with Stage T2a adenocarcinoma of the prostate with Gleason Score of 4+3=7, and PSA of 3.14.  We discussed the patient's workup and outlines the nature of prostate cancer in this setting. The patient's T stage, Gleason's score, and PSA put him into the intermediate risk group. Accordingly he is eligible for a variety of potential treatment options including external radiation, brachytherapy, external beam radiation with brachytherapy boost or prostatectomy. The patient is not an ideal candidate for brachytherapy with a prostate volume of 100 cc. We discussed and outlined the risks, benefits, short and long-term effects associated with radiotherapy. We discussed the role of ADT in the treatment of prostate cancer and he is not interested in this due to the side effects discussed.   At the end of the conversation the patient is interested in moving forward with external radiation. He has not had placement of gold fiducial markers. We will contact [facility] to make arrangements for fiducial marker placement prior to simulation. He will be scheduled for simulation in the near future. We will share our discussion with Dr. [name] and move forward with scheduling placement of 3 gold fiducial markers into the prostate to proceed with IMRT in the near future.   I spent 60 minutes face to face with the patient and more than 50% of that time was spent in counseling  and/or coordination of care.    Nicholos Johns, PA-C    Tyler Pita, MD  [facility] Direct Dial: [phone]  Fax: [phone] [email or web address]  Skype  [email or web address]  This document serves as a record of services personally performed by Tyler Pita, MD and Ailene Ards  Bruning, PA-C. It was created on their behalf by Arlyce Harman, a trained medical scribe. The creation of this record is based on the scribe's personal observations and the provider's statements to them. This document has been checked and approved by the attending provider.

## 2016-06-20 ENCOUNTER — Encounter: Payer: Self-pay | Admitting: Radiation Oncology

## 2016-06-20 ENCOUNTER — Ambulatory Visit
Admission: RE | Admit: 2016-06-20 | Discharge: 2016-06-20 | Disposition: A | Discharge status: Home / Self Care | Payer: Managed Care, Other (non HMO) | Source: Ambulatory Visit | Attending: Radiation Oncology | Admitting: Radiation Oncology

## 2016-06-20 DIAGNOSIS — I10 Essential (primary) hypertension: Secondary | ICD-10-CM | POA: Insufficient documentation

## 2016-06-20 DIAGNOSIS — Z8249 Family history of ischemic heart disease and other diseases of the circulatory system: Secondary | ICD-10-CM | POA: Insufficient documentation

## 2016-06-20 DIAGNOSIS — Z87891 Personal history of nicotine dependence: Secondary | ICD-10-CM | POA: Diagnosis not present

## 2016-06-20 DIAGNOSIS — Z7982 Long term (current) use of aspirin: Secondary | ICD-10-CM | POA: Diagnosis not present

## 2016-06-20 DIAGNOSIS — Z794 Long term (current) use of insulin: Secondary | ICD-10-CM | POA: Diagnosis not present

## 2016-06-20 DIAGNOSIS — Z510 Encounter for antineoplastic radiation therapy: Secondary | ICD-10-CM | POA: Diagnosis not present

## 2016-06-20 DIAGNOSIS — Z951 Presence of aortocoronary bypass graft: Secondary | ICD-10-CM | POA: Diagnosis not present

## 2016-06-20 DIAGNOSIS — Z833 Family history of diabetes mellitus: Secondary | ICD-10-CM | POA: Diagnosis not present

## 2016-06-20 DIAGNOSIS — C61 Malignant neoplasm of prostate: Secondary | ICD-10-CM

## 2016-06-20 DIAGNOSIS — E1140 Type 2 diabetes mellitus with diabetic neuropathy, unspecified: Secondary | ICD-10-CM | POA: Insufficient documentation

## 2016-06-20 DIAGNOSIS — Z8042 Family history of malignant neoplasm of prostate: Secondary | ICD-10-CM | POA: Insufficient documentation

## 2016-06-20 DIAGNOSIS — I2510 Atherosclerotic heart disease of native coronary artery without angina pectoris: Secondary | ICD-10-CM | POA: Insufficient documentation

## 2016-06-20 DIAGNOSIS — E7800 Pure hypercholesterolemia, unspecified: Secondary | ICD-10-CM | POA: Diagnosis not present

## 2016-06-20 DIAGNOSIS — Z79899 Other long term (current) drug therapy: Secondary | ICD-10-CM | POA: Diagnosis not present

## 2016-06-20 HISTORY — DX: Malignant neoplasm of prostate: C61

## 2016-06-20 NOTE — Addendum Note (Signed)
Encounter addended by: Heywood Footman, RN on: 06/20/2016  3:58 PM<BR>    Actions taken: Charge Capture section accepted

## 2016-06-20 NOTE — Addendum Note (Signed)
Encounter addended by: Malena Edman, RN on: 06/20/2016 12:54 PM<BR>    Actions taken: Charge Capture section accepted

## 2016-06-25 ENCOUNTER — Telehealth: Payer: Self-pay | Admitting: *Deleted

## 2016-06-25 NOTE — Telephone Encounter (Signed)
CALLED PATIENT TO INFORM THAT SIM APPT. HAS BEEN MOVED TO 07-01-16 @ 11 AM @ DR. MANNING'S OFFICE, SPOKE WITH PATIENT AND HE IS AWARE OF THIS APPT.

## 2016-06-27 NOTE — Progress Notes (Signed)
  Radiation Oncology         [phone] ________________________________  Name: [name] MRN: [id number]  Date: 07/01/2016  DOB: 1950/04/18  SIMULATION AND TREATMENT PLANNING NOTE    ICD-9-CM ICD-10-CM   1. Prostate cancer ([place]) 185 C61     DIAGNOSIS:  66 y.o. gentleman with Stage T2a adenocarcinoma of the prostate with Gleason Score of 4+3=7, and PSA of 3.14  NARRATIVE:  The patient was brought to the [facility].  Identity was confirmed.  All relevant records and images related to the planned course of therapy were reviewed.  The patient freely provided informed written consent to proceed with treatment after reviewing the details related to the planned course of therapy. The consent form was witnessed and verified by the simulation staff.  Then, the patient was set-up in a stable reproducible supine position for radiation therapy.  A vacuum lock pillow device was custom fabricated to position his legs in a reproducible immobilized position.  Then, I performed a urethrogram under sterile conditions to identify the prostatic apex.  CT images were obtained.  Surface markings were placed.  The CT images were loaded into the planning software.  Then the prostate target and avoidance structures including the rectum, bladder, bowel and hips were contoured.  Treatment planning then occurred.  The radiation prescription was entered and confirmed.  A total of one complex treatment devices were fabricated. I have requested : Intensity Modulated Radiotherapy (IMRT) is medically necessary for this case for the following reason:  Rectal sparing.Marland Kitchen  PLAN:  The patient will receive 78 Gy in 40 fractions.  ________________________________  Sheral Apley Tammi Klippel, M.D.

## 2016-07-01 ENCOUNTER — Encounter: Payer: Self-pay | Admitting: Medical Oncology

## 2016-07-01 ENCOUNTER — Ambulatory Visit
Admission: RE | Admit: 2016-07-01 | Discharge: 2016-07-01 | Disposition: A | Discharge status: Home / Self Care | Payer: Managed Care, Other (non HMO) | Source: Ambulatory Visit | Attending: Radiation Oncology | Admitting: Radiation Oncology

## 2016-07-01 DIAGNOSIS — C61 Malignant neoplasm of prostate: Secondary | ICD-10-CM

## 2016-07-01 DIAGNOSIS — Z510 Encounter for antineoplastic radiation therapy: Secondary | ICD-10-CM | POA: Diagnosis not present

## 2016-07-01 NOTE — Progress Notes (Signed)
I met [name] and introduced myself as the prostate oncology navigator and discussed my role. I was not able to meet him the day of consult with Dr. Tammi Klippel.

## 2016-07-03 DIAGNOSIS — Z510 Encounter for antineoplastic radiation therapy: Secondary | ICD-10-CM | POA: Diagnosis not present

## 2016-07-04 ENCOUNTER — Ambulatory Visit
Admission: RE | Admit: 2016-07-04 | Payer: Managed Care, Other (non HMO) | Source: Ambulatory Visit | Admitting: Radiation Oncology

## 2016-07-10 ENCOUNTER — Ambulatory Visit
Admission: RE | Admit: 2016-07-10 | Discharge: 2016-07-10 | Disposition: A | Discharge status: Home / Self Care | Payer: Managed Care, Other (non HMO) | Source: Ambulatory Visit | Attending: Radiation Oncology | Admitting: Radiation Oncology

## 2016-07-10 DIAGNOSIS — Z510 Encounter for antineoplastic radiation therapy: Secondary | ICD-10-CM | POA: Diagnosis not present

## 2016-07-11 ENCOUNTER — Ambulatory Visit
Admission: RE | Admit: 2016-07-11 | Discharge: 2016-07-11 | Disposition: A | Discharge status: Home / Self Care | Payer: Managed Care, Other (non HMO) | Source: Ambulatory Visit | Attending: Radiation Oncology | Admitting: Radiation Oncology

## 2016-07-11 DIAGNOSIS — Z510 Encounter for antineoplastic radiation therapy: Secondary | ICD-10-CM | POA: Diagnosis not present

## 2016-07-12 ENCOUNTER — Ambulatory Visit
Admission: RE | Admit: 2016-07-12 | Discharge: 2016-07-12 | Disposition: A | Discharge status: Home / Self Care | Payer: Managed Care, Other (non HMO) | Source: Ambulatory Visit | Attending: Radiation Oncology | Admitting: Radiation Oncology

## 2016-07-12 VITALS — BP 153/77 | HR 66 | Resp 18 | Wt 242.0 lb

## 2016-07-12 DIAGNOSIS — Z510 Encounter for antineoplastic radiation therapy: Secondary | ICD-10-CM | POA: Diagnosis not present

## 2016-07-12 DIAGNOSIS — C61 Malignant neoplasm of prostate: Secondary | ICD-10-CM

## 2016-07-12 NOTE — Progress Notes (Signed)
Weight and vitals stable. Reports continued chronic left forearm pain related to lifting weights. Reports nocturia x 3-5. Denies dysuria or hematuria. Denies urinary leakage or incontinence. Describes a strong steady urine stream without difficulty emptying his bladder. Denies any bowel complaints. Denies fatigue.   BP (!) 153/77 (BP Location: Left Arm, Patient Position: Sitting, Cuff Size: Large)   Pulse 66   Resp 18   Wt 242 lb (109.8 kg)   SpO2 100%   BMI 34.72 kg/m  Wt Readings from Last 3 Encounters:  07/12/16 242 lb (109.8 kg)  06/20/16 240 lb (108.9 kg)  04/04/16 251 lb (113.9 kg)

## 2016-07-12 NOTE — Progress Notes (Signed)
  Radiation Oncology         [phone] ________________________________  Name: [name] MRN: [id number]  Date: 07/12/2016  DOB: 1950/06/08    Weekly Radiation Therapy Management    ICD-9-CM ICD-10-CM   1. Prostate cancer ([place]) 185 C61      Current Dose: 5.85 Gy     Planned Dose:  78 Gy  Narrative . . . . . . . . The patient presents for routine under treatment assessment.                                  Weight and vitals stable. Reports continued chronic left forearm pain related to lifting weights. Reports nocturia x 3-5. Denies dysuria or hematuria. Denies urinary leakage or incontinence. Describes a strong steady urine stream without difficulty emptying his bladder. Denies any bowel complaints. Denies fatigue.                                  Set-up films were reviewed.                                 The chart was checked. Physical Findings. . .  weight is 242 lb (109.8 kg). His blood pressure is 153/77 (abnormal) and his pulse is 66. His respiration is 18 and oxygen saturation is 100%. . Weight essentially stable.  No significant changes. Lungs are clear to auscultation bilaterally. Heart has regular rate and rhythm. Abdomen soft, non-tender, normal bowel sounds. Impression . . . . . . . The patient is tolerating radiation. Plan . . . . . . . . . . . . Continue treatment as planned.  ________________________________   Blair Promise, PhD, MD  This document serves as a record of services personally performed by Gery Pray, MD. It was created on his behalf by Arlyce Harman, a trained medical scribe. The creation of this record is based on the scribe's personal observations and the provider's statements to them. This document has been checked and approved by the attending provider.

## 2016-07-15 ENCOUNTER — Ambulatory Visit
Admission: RE | Admit: 2016-07-15 | Discharge: 2016-07-15 | Disposition: A | Discharge status: Home / Self Care | Payer: Managed Care, Other (non HMO) | Source: Ambulatory Visit | Attending: Radiation Oncology | Admitting: Radiation Oncology

## 2016-07-15 DIAGNOSIS — Z510 Encounter for antineoplastic radiation therapy: Secondary | ICD-10-CM | POA: Diagnosis not present

## 2016-07-16 ENCOUNTER — Ambulatory Visit
Admission: RE | Admit: 2016-07-16 | Discharge: 2016-07-16 | Disposition: A | Discharge status: Home / Self Care | Payer: Managed Care, Other (non HMO) | Source: Ambulatory Visit | Attending: Radiation Oncology | Admitting: Radiation Oncology

## 2016-07-16 DIAGNOSIS — Z510 Encounter for antineoplastic radiation therapy: Secondary | ICD-10-CM | POA: Diagnosis not present

## 2016-07-17 ENCOUNTER — Ambulatory Visit
Admission: RE | Admit: 2016-07-17 | Discharge: 2016-07-17 | Disposition: A | Discharge status: Home / Self Care | Payer: Managed Care, Other (non HMO) | Source: Ambulatory Visit | Attending: Radiation Oncology | Admitting: Radiation Oncology

## 2016-07-17 DIAGNOSIS — Z510 Encounter for antineoplastic radiation therapy: Secondary | ICD-10-CM | POA: Diagnosis not present

## 2016-07-18 ENCOUNTER — Ambulatory Visit
Admission: RE | Admit: 2016-07-18 | Discharge: 2016-07-18 | Disposition: A | Discharge status: Home / Self Care | Payer: Managed Care, Other (non HMO) | Source: Ambulatory Visit | Attending: Radiation Oncology | Admitting: Radiation Oncology

## 2016-07-18 DIAGNOSIS — Z510 Encounter for antineoplastic radiation therapy: Secondary | ICD-10-CM | POA: Diagnosis not present

## 2016-07-19 ENCOUNTER — Ambulatory Visit
Admission: RE | Admit: 2016-07-19 | Discharge: 2016-07-19 | Disposition: A | Discharge status: Home / Self Care | Payer: Managed Care, Other (non HMO) | Source: Ambulatory Visit | Attending: Radiation Oncology | Admitting: Radiation Oncology

## 2016-07-19 DIAGNOSIS — Z510 Encounter for antineoplastic radiation therapy: Secondary | ICD-10-CM | POA: Diagnosis not present

## 2016-07-22 ENCOUNTER — Ambulatory Visit
Admission: RE | Admit: 2016-07-22 | Discharge: 2016-07-22 | Disposition: A | Discharge status: Home / Self Care | Payer: Managed Care, Other (non HMO) | Source: Ambulatory Visit | Attending: Radiation Oncology | Admitting: Radiation Oncology

## 2016-07-22 DIAGNOSIS — Z510 Encounter for antineoplastic radiation therapy: Secondary | ICD-10-CM | POA: Diagnosis not present

## 2016-07-23 ENCOUNTER — Ambulatory Visit
Admission: RE | Admit: 2016-07-23 | Discharge: 2016-07-23 | Disposition: A | Discharge status: Home / Self Care | Payer: Managed Care, Other (non HMO) | Source: Ambulatory Visit | Attending: Radiation Oncology | Admitting: Radiation Oncology

## 2016-07-23 DIAGNOSIS — Z510 Encounter for antineoplastic radiation therapy: Secondary | ICD-10-CM | POA: Diagnosis not present

## 2016-07-24 ENCOUNTER — Ambulatory Visit
Admission: RE | Admit: 2016-07-24 | Discharge: 2016-07-24 | Disposition: A | Discharge status: Home / Self Care | Payer: Managed Care, Other (non HMO) | Source: Ambulatory Visit | Attending: Radiation Oncology | Admitting: Radiation Oncology

## 2016-07-24 DIAGNOSIS — Z510 Encounter for antineoplastic radiation therapy: Secondary | ICD-10-CM | POA: Diagnosis not present

## 2016-07-25 ENCOUNTER — Ambulatory Visit
Admission: RE | Admit: 2016-07-25 | Discharge: 2016-07-25 | Disposition: A | Discharge status: Home / Self Care | Payer: Managed Care, Other (non HMO) | Source: Ambulatory Visit | Attending: Radiation Oncology | Admitting: Radiation Oncology

## 2016-07-25 DIAGNOSIS — Z510 Encounter for antineoplastic radiation therapy: Secondary | ICD-10-CM | POA: Diagnosis not present

## 2016-07-26 ENCOUNTER — Ambulatory Visit
Admission: RE | Admit: 2016-07-26 | Discharge: 2016-07-26 | Disposition: A | Discharge status: Home / Self Care | Payer: Managed Care, Other (non HMO) | Source: Ambulatory Visit | Attending: Radiation Oncology | Admitting: Radiation Oncology

## 2016-07-26 DIAGNOSIS — Z510 Encounter for antineoplastic radiation therapy: Secondary | ICD-10-CM | POA: Diagnosis not present

## 2016-07-29 ENCOUNTER — Encounter (INDEPENDENT_AMBULATORY_CARE_PROVIDER_SITE_OTHER): Payer: Self-pay | Admitting: Orthopedic Surgery

## 2016-07-29 ENCOUNTER — Ambulatory Visit (INDEPENDENT_AMBULATORY_CARE_PROVIDER_SITE_OTHER): Payer: Managed Care, Other (non HMO)

## 2016-07-29 ENCOUNTER — Ambulatory Visit: Admission: RE | Admit: 2016-07-29 | Payer: Managed Care, Other (non HMO) | Source: Ambulatory Visit

## 2016-07-29 ENCOUNTER — Ambulatory Visit (INDEPENDENT_AMBULATORY_CARE_PROVIDER_SITE_OTHER): Payer: Managed Care, Other (non HMO) | Admitting: Orthopedic Surgery

## 2016-07-29 VITALS — Ht 70.0 in | Wt 242.0 lb

## 2016-07-29 DIAGNOSIS — M86271 Subacute osteomyelitis, right ankle and foot: Secondary | ICD-10-CM

## 2016-07-29 DIAGNOSIS — L03031 Cellulitis of right toe: Secondary | ICD-10-CM | POA: Insufficient documentation

## 2016-07-29 DIAGNOSIS — L97511 Non-pressure chronic ulcer of other part of right foot limited to breakdown of skin: Secondary | ICD-10-CM

## 2016-07-29 MED ORDER — DOXYCYCLINE HYCLATE 100 MG PO TABS: 100.0000 mg | ORAL_TABLET | Freq: Two times a day (BID) | ORAL | 0 refills | Status: DC

## 2016-07-29 NOTE — Progress Notes (Signed)
Office Visit Note   Patient: [name]           Date of Birth: Aug 27, 1950           MRN: [id number] Visit Date: 07/29/2016              Requested by: Fanny Bien, MD [place], [place] [address] PCP: Rachell Cipro, MD  Chief Complaint  Patient presents with  . Right Foot - Open Wound      HPI: Patient was last in the office in December he had a debridement of the callus patient states that the ulcer and cellulitis of these develop now was from the debridement of the callus.  Assessment & Plan: Visit Diagnoses:  1. Subacute osteomyelitis, right ankle and foot ([place])     Plan: Prescription called in for doxycycline start antibiotic ointment dressing changes daily and he felt leaving pad was placed under his custom orthotics to further unload pressure from the base of the fifth metatarsal. Following the office in 2 weeks.  Follow-Up Instructions: Return in about 2 weeks (around 08/12/2016).   Ortho Exam  Patient is alert, oriented, no adenopathy, well-dressed, normal affect, normal respiratory effort. Examination patient has a normal gait. He has good hair growth down to his foot and has a good dorsalis pedis pulse. Patient has redness and tenderness to palpation around the base of the fifth metatarsal. The ulcer is about 7 mm in diameter 0.1 mm deep this does not probe to bone or tendon. Patient has heel cord tightness with dorsiflexion about 10 short of neutral with the knee extended.  Imaging: Xr Foot Complete Right  Result Date: 07/29/2016 Three-view radiographs of left foot shows calcification of the digital vessels. Patient has no destructive bony changes of the fifth metatarsal no evidence of chronic osteomyelitis.   Labs: Lab Results  Component Value Date   HGBA1C 6.0 (H) 12/24/2015   HGBA1C 7.0 (H) 12/25/2013   HGBA1C 10.0 (H) 09/15/2012   ESRSEDRATE 53 (H) 09/15/2012   REPTSTATUS 12/29/2015 FINAL 12/24/2015   CULT NO GROWTH 5 DAYS  12/24/2015    Orders:  Orders Placed This Encounter  Procedures  . XR Foot Complete Right   Meds ordered this encounter  Medications  . doxycycline (VIBRA-TABS) 100 MG tablet    Sig: Take 1 tablet (100 mg total) by mouth 2 (two) times daily.    Dispense:  60 tablet    Refill:  0     Procedures: No procedures performed  Clinical Data: No additional findings.  ROS:  All other systems negative, except as noted in the HPI. Review of Systems  Objective: Vital Signs: Ht 5\' 10"  (1.778 m)   Wt 242 lb (109.8 kg)   BMI 34.72 kg/m   Specialty Comments:  No specialty comments available.  PMFS History: Patient Active Problem List   Diagnosis Date Noted  . Prostate cancer ([place]) 06/19/2016  . Non-pressure chronic ulcer of other part of right foot limited to breakdown of skin ([place]) 04/04/2016  . Cellulitis of leg, right 12/24/2015  . Acute kidney injury superimposed on CKD ([place]) 12/24/2015  . Cellulitis of right lower extremity 12/24/2015  . CKD (chronic kidney disease), stage III 12/25/2013  . Foot infection 12/24/2013  . Foot ulcer, left ([place]) 12/24/2013  . Cellulitis 09/14/2012  . Diabetic foot ulcer ([place]) 09/14/2012  . Gout 09/14/2012  . HTN (hypertension) 09/14/2012  . Hyperlipidemia 09/14/2012  . Hypothyroidism 09/14/2012  . CAD (coronary artery disease)  09/14/2012  . Diabetes mellitus ([place]) 09/14/2012  . Hypercholesteremia   . Neuropathy [facility])    Past Medical History:  Diagnosis Date  . Coronary artery disease   . Diabetes mellitus without complication ([place])   . Hypercholesteremia   . Hypertension   . Neuropathy   . Prostate cancer [facility])     Family History  Problem Relation Age of Onset  . Diabetes Mellitus II Mother   . Diabetes Mellitus II Father   . CAD Father   . Cancer Father     prostate    Past Surgical History:  Procedure Laterality Date  . AMPUTATION Left 12/25/2013   Procedure: AMPUTATION RAY LEFT 5TH RAY;  Surgeon: [name], MD;   Location: [facility];  Service: Orthopedics;  Laterality: Left;  . BACK SURGERY    . CHOLECYSTECTOMY    . CORONARY ARTERY BYPASS GRAFT     Social History   Occupational History  . Not on file.   Social History Main Topics  . Smoking status: Former Smoker    Types: Cigars    Quit date: 12/24/1988  . Smokeless tobacco: Never Used  . Alcohol use No  . Drug use: No  . Sexual activity: Yes    Partners: Female

## 2016-07-30 ENCOUNTER — Ambulatory Visit: Payer: Managed Care, Other (non HMO)

## 2016-07-31 ENCOUNTER — Ambulatory Visit: Payer: Managed Care, Other (non HMO)

## 2016-08-01 ENCOUNTER — Ambulatory Visit
Admission: RE | Admit: 2016-08-01 | Discharge: 2016-08-01 | Disposition: A | Discharge status: Home / Self Care | Payer: Managed Care, Other (non HMO) | Source: Ambulatory Visit | Attending: Radiation Oncology | Admitting: Radiation Oncology

## 2016-08-01 DIAGNOSIS — Z510 Encounter for antineoplastic radiation therapy: Secondary | ICD-10-CM | POA: Diagnosis not present

## 2016-08-02 ENCOUNTER — Ambulatory Visit
Admission: RE | Admit: 2016-08-02 | Discharge: 2016-08-02 | Disposition: A | Discharge status: Home / Self Care | Payer: Managed Care, Other (non HMO) | Source: Ambulatory Visit | Attending: Radiation Oncology | Admitting: Radiation Oncology

## 2016-08-02 ENCOUNTER — Encounter: Payer: Self-pay | Admitting: Medical Oncology

## 2016-08-02 DIAGNOSIS — Z510 Encounter for antineoplastic radiation therapy: Secondary | ICD-10-CM | POA: Diagnosis not present

## 2016-08-05 ENCOUNTER — Ambulatory Visit
Admission: RE | Admit: 2016-08-05 | Discharge: 2016-08-05 | Disposition: A | Discharge status: Home / Self Care | Payer: Managed Care, Other (non HMO) | Source: Ambulatory Visit | Attending: Radiation Oncology | Admitting: Radiation Oncology

## 2016-08-05 DIAGNOSIS — Z510 Encounter for antineoplastic radiation therapy: Secondary | ICD-10-CM | POA: Diagnosis not present

## 2016-08-06 ENCOUNTER — Ambulatory Visit
Admission: RE | Admit: 2016-08-06 | Discharge: 2016-08-06 | Disposition: A | Discharge status: Home / Self Care | Payer: Managed Care, Other (non HMO) | Source: Ambulatory Visit | Attending: Radiation Oncology | Admitting: Radiation Oncology

## 2016-08-06 DIAGNOSIS — Z510 Encounter for antineoplastic radiation therapy: Secondary | ICD-10-CM | POA: Diagnosis not present

## 2016-08-07 ENCOUNTER — Ambulatory Visit
Admission: RE | Admit: 2016-08-07 | Discharge: 2016-08-07 | Disposition: A | Discharge status: Home / Self Care | Payer: Managed Care, Other (non HMO) | Source: Ambulatory Visit | Attending: Radiation Oncology | Admitting: Radiation Oncology

## 2016-08-07 DIAGNOSIS — Z510 Encounter for antineoplastic radiation therapy: Secondary | ICD-10-CM | POA: Diagnosis not present

## 2016-08-08 ENCOUNTER — Ambulatory Visit
Admission: RE | Admit: 2016-08-08 | Discharge: 2016-08-08 | Disposition: A | Discharge status: Home / Self Care | Payer: Managed Care, Other (non HMO) | Source: Ambulatory Visit | Attending: Radiation Oncology | Admitting: Radiation Oncology

## 2016-08-08 DIAGNOSIS — Z510 Encounter for antineoplastic radiation therapy: Secondary | ICD-10-CM | POA: Diagnosis not present

## 2016-08-09 ENCOUNTER — Ambulatory Visit
Admission: RE | Admit: 2016-08-09 | Discharge: 2016-08-09 | Disposition: A | Discharge status: Home / Self Care | Payer: Managed Care, Other (non HMO) | Source: Ambulatory Visit | Attending: Radiation Oncology | Admitting: Radiation Oncology

## 2016-08-09 DIAGNOSIS — Z510 Encounter for antineoplastic radiation therapy: Secondary | ICD-10-CM | POA: Diagnosis not present

## 2016-08-12 ENCOUNTER — Ambulatory Visit
Admission: RE | Admit: 2016-08-12 | Discharge: 2016-08-12 | Disposition: A | Discharge status: Home / Self Care | Payer: Managed Care, Other (non HMO) | Source: Ambulatory Visit | Attending: Radiation Oncology | Admitting: Radiation Oncology

## 2016-08-12 ENCOUNTER — Encounter (INDEPENDENT_AMBULATORY_CARE_PROVIDER_SITE_OTHER): Payer: Self-pay | Admitting: Orthopedic Surgery

## 2016-08-12 ENCOUNTER — Ambulatory Visit (INDEPENDENT_AMBULATORY_CARE_PROVIDER_SITE_OTHER): Payer: Managed Care, Other (non HMO) | Admitting: Orthopedic Surgery

## 2016-08-12 DIAGNOSIS — Z510 Encounter for antineoplastic radiation therapy: Secondary | ICD-10-CM | POA: Diagnosis not present

## 2016-08-12 DIAGNOSIS — L97511 Non-pressure chronic ulcer of other part of right foot limited to breakdown of skin: Secondary | ICD-10-CM

## 2016-08-12 DIAGNOSIS — M7712 Lateral epicondylitis, left elbow: Secondary | ICD-10-CM | POA: Diagnosis not present

## 2016-08-12 NOTE — Progress Notes (Signed)
Office Visit Note   Patient: [name]           Date of Birth: [birth year]           MRN: [id number] Visit Date: 08/12/2016              Requested by: Fanny Bien, [address] [place], [place] [address] PCP: Fanny Bien, MD  Chief Complaint  Patient presents with  . Right Foot - Follow-up    Follow up wound 5th metatarsal base       HPI: Patient is a 66 year old gentleman diabetic insensate neuropathy wider grade 1 ulcer base of the fifth metatarsal right foot as well as recurrent lateral epicondylitis of the left elbow. Patient has tried using a tennis elbow strap in the past he lifts weights religiously.  Assessment & Plan: Visit Diagnoses:  1. Non-pressure chronic ulcer of other part of right foot limited to breakdown of skin ([place])   2. Lateral epicondylitis, left elbow     Plan: Recommended wearing the tennis elbow strap wall lifting. Recommended wearing the new custom orthotics to unload pressure from the base of the fifth metatarsal recommended antibiotic ointment.  Follow-Up Instructions: Return in about 4 weeks (around 09/09/2016).   Ortho Exam  Patient is alert, oriented, no adenopathy, well-dressed, normal affect, normal respiratory effort. Examination patient's left elbow he has tenderness to palpation of lateral condyle resisted extension of the wrist reproduces pain. There is no redness no cellulitis no olecranon bursitis. Examination of the right foot he has a Wagner grade 1 ulcer which is tender meters in diameter 1 mm deep with healthy beefy granulation tissue does have some callus around the edges from too much pressure. There is no cellulitis no odor no signs of infection.  Imaging: No results found.  Labs: Lab Results  Component Value Date   HGBA1C 6.0 (H) 12/24/2015   HGBA1C 7.0 (H) 12/25/2013   HGBA1C 10.0 (H) 09/15/2012   ESRSEDRATE 53 (H) 09/15/2012   REPTSTATUS 12/29/2015 FINAL 12/24/2015   CULT NO GROWTH 5 DAYS 12/24/2015     Orders:  No orders of the defined types were placed in this encounter.  No orders of the defined types were placed in this encounter.    Procedures: No procedures performed  Clinical Data: No additional findings.  ROS:  All other systems negative, except as noted in the HPI. Review of Systems  Objective: Vital Signs: There were no vitals taken for this visit.  Specialty Comments:  No specialty comments available.  PMFS History: Patient Active Problem List   Diagnosis Date Noted  . Lateral epicondylitis, left elbow 08/12/2016  . Cellulitis of fifth toe of right foot 07/29/2016  . Prostate cancer ([place]) 06/19/2016  . Non-pressure chronic ulcer of other part of right foot limited to breakdown of skin ([place]) 04/04/2016  . Cellulitis of leg, right 12/24/2015  . Acute kidney injury superimposed on CKD ([place]) 12/24/2015  . Cellulitis of right lower extremity 12/24/2015  . CKD (chronic kidney disease), stage III 12/25/2013  . Foot infection 12/24/2013  . Foot ulcer, left ([place]) 12/24/2013  . Cellulitis 09/14/2012  . Diabetic foot ulcer ([place]) 09/14/2012  . Gout 09/14/2012  . HTN (hypertension) 09/14/2012  . Hyperlipidemia 09/14/2012  . Hypothyroidism 09/14/2012  . CAD (coronary artery disease) 09/14/2012  . Diabetes mellitus ([place]) 09/14/2012  . Hypercholesteremia   . Neuropathy [facility])    Past Medical History:  Diagnosis Date  . Coronary artery disease   .  Diabetes mellitus without complication ([place])   . Hypercholesteremia   . Hypertension   . Neuropathy   . Prostate cancer [facility])     Family History  Problem Relation Age of Onset  . Diabetes Mellitus II Mother   . Diabetes Mellitus II Father   . CAD Father   . Cancer Father     prostate    Past Surgical History:  Procedure Laterality Date  . AMPUTATION Left 12/25/2013   Procedure: AMPUTATION RAY LEFT 5TH RAY;  Surgeon: [name], MD;  Location: [facility];  Service: Orthopedics;  Laterality: Left;  . BACK  SURGERY    . CHOLECYSTECTOMY    . CORONARY ARTERY BYPASS GRAFT     Social History   Occupational History  . Not on file.   Social History Main Topics  . Smoking status: Former Smoker    Types: Cigars    Quit date: 12/24/1988  . Smokeless tobacco: Never Used  . Alcohol use No  . Drug use: No  . Sexual activity: Yes    Partners: Female

## 2016-08-13 ENCOUNTER — Ambulatory Visit
Admission: RE | Admit: 2016-08-13 | Discharge: 2016-08-13 | Disposition: A | Discharge status: Home / Self Care | Payer: Managed Care, Other (non HMO) | Source: Ambulatory Visit | Attending: Radiation Oncology | Admitting: Radiation Oncology

## 2016-08-13 DIAGNOSIS — Z510 Encounter for antineoplastic radiation therapy: Secondary | ICD-10-CM | POA: Diagnosis not present

## 2016-08-14 ENCOUNTER — Ambulatory Visit
Admission: RE | Admit: 2016-08-14 | Discharge: 2016-08-14 | Disposition: A | Discharge status: Home / Self Care | Payer: Managed Care, Other (non HMO) | Source: Ambulatory Visit | Attending: Radiation Oncology | Admitting: Radiation Oncology

## 2016-08-14 DIAGNOSIS — Z510 Encounter for antineoplastic radiation therapy: Secondary | ICD-10-CM | POA: Diagnosis not present

## 2016-08-15 ENCOUNTER — Ambulatory Visit
Admission: RE | Admit: 2016-08-15 | Discharge: 2016-08-15 | Disposition: A | Discharge status: Home / Self Care | Payer: Managed Care, Other (non HMO) | Source: Ambulatory Visit | Attending: Radiation Oncology | Admitting: Radiation Oncology

## 2016-08-15 ENCOUNTER — Other Ambulatory Visit: Payer: Self-pay | Admitting: Radiation Oncology

## 2016-08-15 DIAGNOSIS — R05 Cough: Secondary | ICD-10-CM

## 2016-08-15 DIAGNOSIS — R059 Cough, unspecified: Secondary | ICD-10-CM

## 2016-08-15 DIAGNOSIS — Z510 Encounter for antineoplastic radiation therapy: Secondary | ICD-10-CM | POA: Diagnosis not present

## 2016-08-15 DIAGNOSIS — J209 Acute bronchitis, unspecified: Secondary | ICD-10-CM

## 2016-08-15 MED ORDER — GUAIFENESIN-CODEINE 100-10 MG/5ML PO SOLN: 5.0000 mL | ORAL | 0 refills | Status: DC | PRN

## 2016-08-16 ENCOUNTER — Ambulatory Visit
Admission: RE | Admit: 2016-08-16 | Discharge: 2016-08-16 | Disposition: A | Discharge status: Home / Self Care | Payer: Managed Care, Other (non HMO) | Source: Ambulatory Visit | Attending: Radiation Oncology | Admitting: Radiation Oncology

## 2016-08-16 DIAGNOSIS — Z510 Encounter for antineoplastic radiation therapy: Secondary | ICD-10-CM | POA: Diagnosis not present

## 2016-08-19 ENCOUNTER — Ambulatory Visit
Admission: RE | Admit: 2016-08-19 | Discharge: 2016-08-19 | Disposition: A | Discharge status: Home / Self Care | Payer: Managed Care, Other (non HMO) | Source: Ambulatory Visit | Attending: Radiation Oncology | Admitting: Radiation Oncology

## 2016-08-19 DIAGNOSIS — Z510 Encounter for antineoplastic radiation therapy: Secondary | ICD-10-CM | POA: Diagnosis not present

## 2016-08-20 ENCOUNTER — Ambulatory Visit
Admission: RE | Admit: 2016-08-20 | Discharge: 2016-08-20 | Disposition: A | Discharge status: Home / Self Care | Payer: Managed Care, Other (non HMO) | Source: Ambulatory Visit | Attending: Radiation Oncology | Admitting: Radiation Oncology

## 2016-08-20 DIAGNOSIS — Z510 Encounter for antineoplastic radiation therapy: Secondary | ICD-10-CM | POA: Diagnosis not present

## 2016-08-21 ENCOUNTER — Ambulatory Visit
Admission: RE | Admit: 2016-08-21 | Discharge: 2016-08-21 | Disposition: A | Discharge status: Home / Self Care | Payer: Managed Care, Other (non HMO) | Source: Ambulatory Visit | Attending: Radiation Oncology | Admitting: Radiation Oncology

## 2016-08-21 DIAGNOSIS — Z510 Encounter for antineoplastic radiation therapy: Secondary | ICD-10-CM | POA: Diagnosis not present

## 2016-08-22 ENCOUNTER — Ambulatory Visit
Admission: RE | Admit: 2016-08-22 | Discharge: 2016-08-22 | Disposition: A | Discharge status: Home / Self Care | Payer: Managed Care, Other (non HMO) | Source: Ambulatory Visit | Attending: Radiation Oncology | Admitting: Radiation Oncology

## 2016-08-22 DIAGNOSIS — Z510 Encounter for antineoplastic radiation therapy: Secondary | ICD-10-CM | POA: Diagnosis not present

## 2016-08-23 ENCOUNTER — Other Ambulatory Visit: Payer: Self-pay | Admitting: Radiation Oncology

## 2016-08-23 ENCOUNTER — Ambulatory Visit
Admission: RE | Admit: 2016-08-23 | Discharge: 2016-08-23 | Disposition: A | Discharge status: Home / Self Care | Payer: Managed Care, Other (non HMO) | Source: Ambulatory Visit | Attending: Radiation Oncology | Admitting: Radiation Oncology

## 2016-08-23 DIAGNOSIS — C61 Malignant neoplasm of prostate: Secondary | ICD-10-CM

## 2016-08-23 DIAGNOSIS — Z510 Encounter for antineoplastic radiation therapy: Secondary | ICD-10-CM | POA: Diagnosis not present

## 2016-08-23 MED ORDER — TAMSULOSIN HCL 0.4 MG PO CAPS: 0.4000 mg | ORAL_CAPSULE | Freq: Every day | ORAL | 5 refills | Status: DC

## 2016-08-26 ENCOUNTER — Ambulatory Visit
Admission: RE | Admit: 2016-08-26 | Discharge: 2016-08-26 | Disposition: A | Discharge status: Home / Self Care | Payer: Managed Care, Other (non HMO) | Source: Ambulatory Visit | Attending: Radiation Oncology | Admitting: Radiation Oncology

## 2016-08-26 DIAGNOSIS — Z510 Encounter for antineoplastic radiation therapy: Secondary | ICD-10-CM | POA: Diagnosis not present

## 2016-08-27 ENCOUNTER — Ambulatory Visit
Admission: RE | Admit: 2016-08-27 | Discharge: 2016-08-27 | Disposition: A | Discharge status: Home / Self Care | Payer: Managed Care, Other (non HMO) | Source: Ambulatory Visit | Attending: Radiation Oncology | Admitting: Radiation Oncology

## 2016-08-27 DIAGNOSIS — Z510 Encounter for antineoplastic radiation therapy: Secondary | ICD-10-CM | POA: Diagnosis not present

## 2016-08-28 ENCOUNTER — Ambulatory Visit
Admission: RE | Admit: 2016-08-28 | Discharge: 2016-08-28 | Disposition: A | Discharge status: Home / Self Care | Payer: Managed Care, Other (non HMO) | Source: Ambulatory Visit | Attending: Radiation Oncology | Admitting: Radiation Oncology

## 2016-08-28 DIAGNOSIS — Z510 Encounter for antineoplastic radiation therapy: Secondary | ICD-10-CM | POA: Diagnosis not present

## 2016-08-29 ENCOUNTER — Ambulatory Visit
Admission: RE | Admit: 2016-08-29 | Discharge: 2016-08-29 | Disposition: A | Discharge status: Home / Self Care | Payer: Managed Care, Other (non HMO) | Source: Ambulatory Visit | Attending: Radiation Oncology | Admitting: Radiation Oncology

## 2016-08-29 DIAGNOSIS — Z510 Encounter for antineoplastic radiation therapy: Secondary | ICD-10-CM | POA: Diagnosis not present

## 2016-08-30 ENCOUNTER — Ambulatory Visit
Admission: RE | Admit: 2016-08-30 | Discharge: 2016-08-30 | Disposition: A | Discharge status: Home / Self Care | Payer: Managed Care, Other (non HMO) | Source: Ambulatory Visit | Attending: Radiation Oncology | Admitting: Radiation Oncology

## 2016-08-30 DIAGNOSIS — Z510 Encounter for antineoplastic radiation therapy: Secondary | ICD-10-CM | POA: Diagnosis not present

## 2016-09-03 ENCOUNTER — Ambulatory Visit
Admission: RE | Admit: 2016-09-03 | Discharge: 2016-09-03 | Disposition: A | Discharge status: Home / Self Care | Payer: Managed Care, Other (non HMO) | Source: Ambulatory Visit | Attending: Radiation Oncology | Admitting: Radiation Oncology

## 2016-09-03 ENCOUNTER — Ambulatory Visit: Admission: RE | Admit: 2016-09-03 | Payer: Managed Care, Other (non HMO) | Source: Ambulatory Visit

## 2016-09-03 DIAGNOSIS — Z510 Encounter for antineoplastic radiation therapy: Secondary | ICD-10-CM | POA: Diagnosis not present

## 2016-09-04 ENCOUNTER — Ambulatory Visit: Payer: Managed Care, Other (non HMO)

## 2016-09-05 ENCOUNTER — Ambulatory Visit
Admission: RE | Admit: 2016-09-05 | Discharge: 2016-09-05 | Disposition: A | Discharge status: Home / Self Care | Payer: Managed Care, Other (non HMO) | Source: Ambulatory Visit | Attending: Radiation Oncology | Admitting: Radiation Oncology

## 2016-09-05 ENCOUNTER — Ambulatory Visit: Payer: Managed Care, Other (non HMO)

## 2016-09-05 DIAGNOSIS — Z510 Encounter for antineoplastic radiation therapy: Secondary | ICD-10-CM | POA: Diagnosis not present

## 2016-09-06 ENCOUNTER — Ambulatory Visit: Payer: Managed Care, Other (non HMO)

## 2016-09-09 ENCOUNTER — Encounter: Payer: Self-pay | Admitting: Radiation Oncology

## 2016-09-09 ENCOUNTER — Ambulatory Visit: Payer: Managed Care, Other (non HMO)

## 2016-09-09 NOTE — Progress Notes (Signed)
  Radiation Oncology         [phone]) [phone] ________________________________  Name: [name] MRN: [id number]  Date: 09/09/2016  DOB: [birth year]  End of Treatment Note  Diagnosis: 66 y.o. gentleman with Stage T2a adenocarcinoma of the prostate with Gleason Score of 4+3=7, and PSA of 3.14    Indication for treatment:  Curative       Radiation treatment dates:   07/10/16-09/05/16  Site/dose:   Prostate/ 78 Gy in 40 fractions  Beams/energy:   IMRT/ 6X  Narrative: The patient tolerated radiation treatment relatively well. Patient reported managing his bladder symptoms with Flomax.  Plan: The patient has completed radiation treatment. The patient will return to radiation oncology clinic for routine followup in one month. I advised him to call or return sooner if he has any questions or concerns related to his recovery or treatment. ________________________________  Sheral Apley. Tammi Klippel, M.D.   This document serves as a record of services personally performed by Tyler Pita, MD. It was created on his behalf by Bethann Humble, a trained medical scribe. The creation of this record is based on the scribe's personal observations and the provider's statements to them. This document has been checked and approved by the attending provider.

## 2016-09-19 ENCOUNTER — Ambulatory Visit (INDEPENDENT_AMBULATORY_CARE_PROVIDER_SITE_OTHER): Payer: Managed Care, Other (non HMO)

## 2016-09-19 ENCOUNTER — Ambulatory Visit (INDEPENDENT_AMBULATORY_CARE_PROVIDER_SITE_OTHER): Payer: Managed Care, Other (non HMO) | Admitting: Orthopedic Surgery

## 2016-09-19 DIAGNOSIS — L97511 Non-pressure chronic ulcer of other part of right foot limited to breakdown of skin: Secondary | ICD-10-CM | POA: Diagnosis not present

## 2016-09-19 DIAGNOSIS — M79632 Pain in left forearm: Secondary | ICD-10-CM

## 2016-09-19 NOTE — Progress Notes (Signed)
Office Visit Note   Patient: [name]           Date of Birth: July 30, 1950           MRN: [id number] Visit Date: 09/19/2016              Requested by: Fanny Bien, [address] [place], [place] [address] PCP: Fanny Bien, MD  Chief Complaint  Patient presents with  . Left Elbow - Follow-up  . Right Foot - Follow-up      HPI: Patient complains of pain along the border of the ulna left forearm. Patient states that the pain is constant but worse when exercising. Patient also complains of a chronic ulcer over the base of the fifth metatarsal right foot he has a new double upright brace on the left knee orthotics. Patient also complains of some burning radicular pain in the left foot well-seated and driving.  Assessment & Plan: Visit Diagnoses:  1. Pain in left forearm   2. Left forearm pain   3. Non-pressure chronic ulcer of other part of right foot limited to breakdown of skin ([place])     Plan: Ulcer debridement of skin and soft tissue right foot. A felt relieving pad was placed in the shoe to unload pressure from the ulcer.  Follow-Up Instructions: Return in about 3 weeks (around 10/10/2016).   Ortho Exam  Patient is alert, oriented, no adenopathy, well-dressed, normal affect, normal respiratory effort. Examination patient has an antalgic gait. He has no focal motor weakness in either lower extremity his left elbow is nontender to palpation lateral condyle is nontender to palpation he does have tenderness to palpation along the subcutaneous border of the junction of the middle and proximal third of the left ulna. His hand is neurovascularly intact there is no skin ulcers or breakdown. Examination the right foot he has a chronic Wagner grade 1 ulcer over the base of the fifth metatarsal. After informed consent a 10 blade knife was used to debride the skin and soft tissue back to bleeding viable granulation tissue. Silver nitrate was used for hemostasis a Band-Aid  was applied. The ulcer is 40 x 10 mm x 1 mm deep. There is no cellulitis no drainage no signs of infection. The left forearm is generally tender to palpation along the subcutaneous border. There is no palpable fluid collections no palpable bony changes  Imaging: No results found.  Labs: Lab Results  Component Value Date   HGBA1C 6.0 (H) 12/24/2015   HGBA1C 7.0 (H) 12/25/2013   HGBA1C 10.0 (H) 09/15/2012   ESRSEDRATE 53 (H) 09/15/2012   REPTSTATUS 12/29/2015 FINAL 12/24/2015   CULT NO GROWTH 5 DAYS 12/24/2015    Orders:  Orders Placed This Encounter  Procedures  . XR Forearm Left   No orders of the defined types were placed in this encounter.    Procedures: No procedures performed  Clinical Data: No additional findings.  ROS:  All other systems negative, except as noted in the HPI. Review of Systems  Objective: Vital Signs: There were no vitals taken for this visit.  Specialty Comments:  No specialty comments available.  PMFS History: Patient Active Problem List   Diagnosis Date Noted  . Lateral epicondylitis, left elbow 08/12/2016  . Cellulitis of fifth toe of right foot 07/29/2016  . Prostate cancer ([place]) 06/19/2016  . Non-pressure chronic ulcer of other part of right foot limited to breakdown of skin ([place]) 04/04/2016  . Cellulitis of leg,  right 12/24/2015  . Acute kidney injury superimposed on CKD ([place]) 12/24/2015  . Cellulitis of right lower extremity 12/24/2015  . CKD (chronic kidney disease), stage III 12/25/2013  . Foot infection 12/24/2013  . Foot ulcer, left ([place]) 12/24/2013  . Cellulitis 09/14/2012  . Diabetic foot ulcer ([place]) 09/14/2012  . Gout 09/14/2012  . HTN (hypertension) 09/14/2012  . Hyperlipidemia 09/14/2012  . Hypothyroidism 09/14/2012  . CAD (coronary artery disease) 09/14/2012  . Diabetes mellitus ([place]) 09/14/2012  . Hypercholesteremia   . Neuropathy [facility])    Past Medical History:  Diagnosis Date  . Coronary artery disease   .  Diabetes mellitus without complication ([place])   . Hypercholesteremia   . Hypertension   . Neuropathy   . Prostate cancer [facility])     Family History  Problem Relation Age of Onset  . Diabetes Mellitus II Mother   . Diabetes Mellitus II Father   . CAD Father   . Cancer Father        prostate    Past Surgical History:  Procedure Laterality Date  . AMPUTATION Left 12/25/2013   Procedure: AMPUTATION RAY LEFT 5TH RAY;  Surgeon: [name], MD;  Location: [facility];  Service: Orthopedics;  Laterality: Left;  . BACK SURGERY    . CHOLECYSTECTOMY    . CORONARY ARTERY BYPASS GRAFT     Social History   Occupational History  . Not on file.   Social History Main Topics  . Smoking status: Former Smoker    Types: Cigars    Quit date: 12/24/1988  . Smokeless tobacco: Never Used  . Alcohol use No  . Drug use: No  . Sexual activity: Yes    Partners: Female

## 2016-09-20 ENCOUNTER — Other Ambulatory Visit (INDEPENDENT_AMBULATORY_CARE_PROVIDER_SITE_OTHER): Payer: Self-pay | Admitting: Orthopedic Surgery

## 2016-09-30 ENCOUNTER — Other Ambulatory Visit: Payer: Self-pay | Admitting: Family Medicine

## 2016-09-30 ENCOUNTER — Ambulatory Visit
Admission: RE | Admit: 2016-09-30 | Discharge: 2016-09-30 | Disposition: A | Discharge status: Home / Self Care | Payer: Managed Care, Other (non HMO) | Source: Ambulatory Visit | Attending: Family Medicine | Admitting: Family Medicine

## 2016-09-30 DIAGNOSIS — W19XXXA Unspecified fall, initial encounter: Secondary | ICD-10-CM

## 2016-09-30 DIAGNOSIS — M25522 Pain in left elbow: Secondary | ICD-10-CM

## 2016-10-10 ENCOUNTER — Ambulatory Visit (INDEPENDENT_AMBULATORY_CARE_PROVIDER_SITE_OTHER): Payer: Managed Care, Other (non HMO) | Admitting: Orthopedic Surgery

## 2016-10-10 ENCOUNTER — Encounter (INDEPENDENT_AMBULATORY_CARE_PROVIDER_SITE_OTHER): Payer: Self-pay | Admitting: Orthopedic Surgery

## 2016-10-10 VITALS — Ht 70.0 in | Wt 242.0 lb

## 2016-10-10 DIAGNOSIS — L97511 Non-pressure chronic ulcer of other part of right foot limited to breakdown of skin: Secondary | ICD-10-CM | POA: Diagnosis not present

## 2016-10-10 NOTE — Progress Notes (Signed)
Office Visit Note   Patient: [name]           Date of Birth: 25-Apr-1950           MRN: [id number] Visit Date: 10/10/2016              Requested by: Fanny Bien, [address] [place], [place] [address] PCP: Fanny Bien, MD  Chief Complaint  Patient presents with  . Left Foot - Pain  . Right Foot - Wound Check    Base of 5th metatarsal ulceration      HPI: Patient is a 66 year old gentleman diabetic insensate neuropathy venous stasis insufficiency with foot drop on the left who presents with a new ulcer beneath the base of the fifth metatarsal left foot with a chronic ulcer beneath the base of the fifth metatarsal right foot. Patient complains of pain on the left foot.  Assessment & Plan: Visit Diagnoses:  1. Non-pressure chronic ulcer of other part of right foot limited to breakdown of skin ([place])     Plan: The ulcers were debrided of skin and soft tissue 2 recommend wearing compression stockings daily continue custom orthotics double upright brace on the left continued dressing changes daily  Follow-Up Instructions: Return in about 4 weeks (around 11/07/2016).   Ortho Exam  Patient is alert, oriented, no adenopathy, well-dressed, normal affect, normal respiratory effort. Examination patient has brawny skin color changes in both legs. He has pitting edema up to the tibial tubercle with venous stasis swelling without ulceration. He has a palpable pulse. He has dorsiflexion just past neutral bilaterally with no equinus contracture he does have a foot drop on the left. He has a new ulcer beneath the base of the fifth metatarsal left foot. After informed consent a 10 blade knife was used to debride the skin and soft tissue back to healthy viable granulation tissue. There was fluid but no purulence no abscess no signs of infection this did not probe to bone or tendon. The ulcer is 15 mm in diameter and 1 mm deep a Band-Aid was applied silver nitrate was used for  hemostasis. Examination the right foot patient has a chronic 10 mm diameter ulcer with good granulation tissue on the right foot over the base of the fifth metatarsal this is 1 mm deep. It does not probe to bone or tendon there is no signs of infection no drainage no odor.  Imaging: No results found.  Labs: Lab Results  Component Value Date   HGBA1C 6.0 (H) 12/24/2015   HGBA1C 7.0 (H) 12/25/2013   HGBA1C 10.0 (H) 09/15/2012   ESRSEDRATE 53 (H) 09/15/2012   REPTSTATUS 12/29/2015 FINAL 12/24/2015   CULT NO GROWTH 5 DAYS 12/24/2015    Orders:  No orders of the defined types were placed in this encounter.  No orders of the defined types were placed in this encounter.    Procedures: No procedures performed  Clinical Data: No additional findings.  ROS:  All other systems negative, except as noted in the HPI. Review of Systems  Objective: Vital Signs: Ht 5\' 10"  (1.778 m)   Wt 242 lb (109.8 kg)   BMI 34.72 kg/m   Specialty Comments:  No specialty comments available.  PMFS History: Patient Active Problem List   Diagnosis Date Noted  . Lateral epicondylitis, left elbow 08/12/2016  . Cellulitis of fifth toe of right foot 07/29/2016  . Prostate cancer ([place]) 06/19/2016  . Non-pressure chronic ulcer of other part  of right foot limited to breakdown of skin ([place]) 04/04/2016  . Cellulitis of leg, right 12/24/2015  . Acute kidney injury superimposed on CKD ([place]) 12/24/2015  . Cellulitis of right lower extremity 12/24/2015  . CKD (chronic kidney disease), stage III 12/25/2013  . Foot infection 12/24/2013  . Foot ulcer, left ([place]) 12/24/2013  . Cellulitis 09/14/2012  . Diabetic foot ulcer ([place]) 09/14/2012  . Gout 09/14/2012  . HTN (hypertension) 09/14/2012  . Hyperlipidemia 09/14/2012  . Hypothyroidism 09/14/2012  . CAD (coronary artery disease) 09/14/2012  . Diabetes mellitus ([place]) 09/14/2012  . Hypercholesteremia   . Neuropathy [facility])    Past Medical History:    Diagnosis Date  . Coronary artery disease   . Diabetes mellitus without complication ([place])   . Hypercholesteremia   . Hypertension   . Neuropathy   . Prostate cancer [facility])     Family History  Problem Relation Age of Onset  . Diabetes Mellitus II Mother   . Diabetes Mellitus II Father   . CAD Father   . Cancer Father        prostate    Past Surgical History:  Procedure Laterality Date  . AMPUTATION Left 12/25/2013   Procedure: AMPUTATION RAY LEFT 5TH RAY;  Surgeon: [name], MD;  Location: [facility];  Service: Orthopedics;  Laterality: Left;  . BACK SURGERY    . CHOLECYSTECTOMY    . CORONARY ARTERY BYPASS GRAFT     Social History   Occupational History  . Not on file.   Social History Main Topics  . Smoking status: Former Smoker    Types: Cigars    Quit date: 12/24/1988  . Smokeless tobacco: Never Used  . Alcohol use No  . Drug use: No  . Sexual activity: Yes    Partners: Female

## 2016-10-17 ENCOUNTER — Ambulatory Visit
Admission: RE | Admit: 2016-10-17 | Discharge: 2016-10-17 | Disposition: A | Discharge status: Home / Self Care | Payer: Managed Care, Other (non HMO) | Source: Ambulatory Visit | Attending: Radiation Oncology | Admitting: Radiation Oncology

## 2016-10-17 ENCOUNTER — Encounter: Payer: Self-pay | Admitting: Urology

## 2016-10-17 DIAGNOSIS — E1140 Type 2 diabetes mellitus with diabetic neuropathy, unspecified: Secondary | ICD-10-CM | POA: Diagnosis not present

## 2016-10-17 DIAGNOSIS — Z833 Family history of diabetes mellitus: Secondary | ICD-10-CM | POA: Insufficient documentation

## 2016-10-17 DIAGNOSIS — Z87891 Personal history of nicotine dependence: Secondary | ICD-10-CM | POA: Insufficient documentation

## 2016-10-17 DIAGNOSIS — I2510 Atherosclerotic heart disease of native coronary artery without angina pectoris: Secondary | ICD-10-CM | POA: Insufficient documentation

## 2016-10-17 DIAGNOSIS — Z8249 Family history of ischemic heart disease and other diseases of the circulatory system: Secondary | ICD-10-CM | POA: Insufficient documentation

## 2016-10-17 DIAGNOSIS — Z951 Presence of aortocoronary bypass graft: Secondary | ICD-10-CM | POA: Insufficient documentation

## 2016-10-17 DIAGNOSIS — Z8042 Family history of malignant neoplasm of prostate: Secondary | ICD-10-CM | POA: Diagnosis not present

## 2016-10-17 DIAGNOSIS — E7800 Pure hypercholesterolemia, unspecified: Secondary | ICD-10-CM | POA: Diagnosis not present

## 2016-10-17 DIAGNOSIS — Z7982 Long term (current) use of aspirin: Secondary | ICD-10-CM | POA: Diagnosis not present

## 2016-10-17 DIAGNOSIS — C61 Malignant neoplasm of prostate: Secondary | ICD-10-CM | POA: Diagnosis not present

## 2016-10-17 DIAGNOSIS — I10 Essential (primary) hypertension: Secondary | ICD-10-CM | POA: Diagnosis not present

## 2016-10-17 DIAGNOSIS — Z510 Encounter for antineoplastic radiation therapy: Secondary | ICD-10-CM | POA: Diagnosis present

## 2016-10-17 DIAGNOSIS — Z79899 Other long term (current) drug therapy: Secondary | ICD-10-CM | POA: Insufficient documentation

## 2016-10-17 DIAGNOSIS — Z794 Long term (current) use of insulin: Secondary | ICD-10-CM | POA: Insufficient documentation

## 2016-10-17 NOTE — Progress Notes (Signed)
Follow up prostate cancer,   radiation 07/10/16-09/05/16 78Gy/40 fractions No hematuria or dysuria, good stream, nocturia down to 2x, takes flomax still, regular bowels, has a lot of gas stated, appetite good,  C/o shoulder left and feet pain Mild fatigue has gotten better 8:32 AM BP (!) 156/72   Pulse 68   Temp 98 F (36.7 C) (Oral)   Resp 20   Ht 5\' 10"  (1.778 m)   Wt 242 lb 9.6 oz (110 kg)   BMI 34.81 kg/m   Wt Readings from Last 3 Encounters:  10/17/16 242 lb 9.6 oz (110 kg)  10/10/16 242 lb (109.8 kg)  07/29/16 242 lb (109.8 kg)   8:32 AM

## 2016-10-17 NOTE — Addendum Note (Signed)
Encounter addended by: Doreen Beam, RN on: 10/17/2016  9:37 AM<BR>    Actions taken: Visit Navigator Flowsheet section accepted

## 2016-10-17 NOTE — Progress Notes (Signed)
Radiation Oncology         [phone] ________________________________  Name: Tahmid Stonehocker MRN: [id number]  Date: 10/17/2016  DOB: 07/13/1950  Post Treatment Note  CC: Fanny Bien, MD  [name], MD  Diagnosis:   66 y.o.gentleman with Stage T2aadenocarcinoma of the prostate with Gleason Score of 4+3=7, and PSA of 3.14    Interval Since Last Radiation:  6 weeks  07/10/16-09/05/16:   Prostate/ 78 Gy in 40 fractions  Narrative:  The patient returns today for routine follow-up.  He tolerated radiation treatment relatively well. Patient did experience moderate bladder symptoms with increased frequency, urgency and nocturia which were managed with Flomax.                            On review of systems, the patient states that he is doing well in general.  He continues to use Flomax daily as he has noted a significant improvement in his nocturia, frequency and force of stream.  He denies dysuria, gross hematuria, incontinence, weak stream or hesitancy. He reports nocturia is 1-2x/night which is manageable. He denies abdominal pain, nausea, vomiting or diarrhea.  Bowel movements have returned to normal but he continues with significant gas which he attributes to his diet.  He reports a healthy appetite and is maintaining his weight.  His energy level is gradually improving and he has continued to work out at the gym regularly.  He uses Cialis prn for ED and reports that this is successful.  ALLERGIES:  is allergic to mushroom extract complex.  Meds: Current Outpatient Prescriptions  Medication Sig Dispense Refill  . allopurinol (ZYLOPRIM) 300 MG tablet Take 300 mg by mouth daily.    Marland Kitchen amLODipine (NORVASC) 10 MG tablet Take 10 mg by mouth at bedtime.    Marland Kitchen aspirin EC 81 MG tablet Take 81 mg by mouth at bedtime.     . carvedilol (COREG) 6.25 MG tablet Take 6.25 mg by mouth 2 (two) times daily.    . diclofenac sodium (VOLTAREN) 1 % GEL Apply 1 application topically daily.    .  Esomeprazole Magnesium (NEXIUM PO) Take 22.3 mg by mouth daily.    Marland Kitchen ezetimibe (ZETIA) 10 MG tablet Take 10 mg by mouth daily.    . Insulin Degludec (TRESIBA FLEXTOUCH) 200 UNIT/ML SOPN Inject 40 Units into the skin at bedtime.    Marland Kitchen levothyroxine (SYNTHROID, LEVOTHROID) 112 MCG tablet Take 112 mcg by mouth daily before breakfast.    . OVER THE COUNTER MEDICATION Take 1 tablet by mouth See admin instructions. Take 1 tablet by mouth every 2 hours during the night as needed for leg cramps: Hyland's Leg Cramps PM    . pregabalin (LYRICA) 75 MG capsule Take 1 capsule (75 mg total) by mouth at bedtime. 30 capsule 0  . tadalafil (CIALIS) 5 MG tablet Take 5 mg by mouth daily as needed for erectile dysfunction.    . tamsulosin (FLOMAX) 0.4 MG CAPS capsule Take 1 capsule (0.4 mg total) by mouth daily after supper. 30 capsule 5  . triamcinolone (KENALOG) 0.025 % cream Apply 1 application topically daily as needed (psoriasis).    Marland Kitchen doxycycline (VIBRA-TABS) 100 MG tablet Take 1 tablet (100 mg total) by mouth 2 (two) times daily. (Patient not taking: Reported on 10/17/2016) 60 tablet 0  . guaiFENesin-codeine 100-10 MG/5ML syrup Take 5-10 mLs by mouth every 4 (four) hours as needed for cough. (Patient not taking: Reported on 10/17/2016)  236 mL 0   No current facility-administered medications for this encounter.     Physical Findings:  height is 5\' 10"  (1.778 m) and weight is 242 lb 9.6 oz (110 kg). His oral temperature is 98 F (36.7 C). His blood pressure is 156/72 (abnormal) and his pulse is 68. His respiration is 20.  Pain Assessment Pain Score: 4  Pain Loc: Foot/10 In general this is a well appearing caucasian male in no acute distress. He's alert and oriented x4 and appropriate throughout the examination. Cardiopulmonary assessment is negative for acute distress and he exhibits normal effort.   Lab Findings: Lab Results  Component Value Date   WBC 7.0 12/27/2015   HGB 11.3 (L) 12/27/2015   HCT 34.3  (L) 12/27/2015   MCV 86.0 12/27/2015   PLT 159 12/27/2015     Radiographic Findings: Dg Elbow Complete Left (3+view)  Result Date: 09/30/2016 CLINICAL DATA:  Left posterior and medial elbow pain. Fall 6 days ago. EXAM: LEFT ELBOW - COMPLETE 3+ VIEW COMPARISON:  09/19/2016 FINDINGS: There is no evidence of fracture, dislocation, or joint effusion. There is no evidence of arthropathy or other focal bone abnormality. Soft tissues are unremarkable. IMPRESSION: Negative. Electronically Signed   By: Rolm Baptise M.D.   On: 09/30/2016 17:29   Xr Forearm Left  Result Date: 09/19/2016 2 view radiographs of left forearm shows no periosteal reactionassistant changes no fracture no bony abnormalities.   Impression/Plan: 1. 66 y.o.gentleman with Stage T2aadenocarcinoma of the prostate with Gleason Score of 4+3=7, and PSA of 3.14. He will continue to follow up with urology for ongoing PSA determinations and has an appointment scheduled with Dr. [name] next week. He understands what to expect with regards to PSA monitoring going forward. I will look forward to following his response to treatment via correspondence with urology, and would be happy to continue to participate in his care if clinically indicated. I talked to the patient about what to expect in the future, including his risk for erectile dysfunction rectal bleeding. I encouraged him to call or return to the office if he has any questions regarding his previous radiation or possible radiation side effects. He was comfortable with this plan and will follow up as needed.    Nicholos Johns, PA-C

## 2016-10-23 ENCOUNTER — Other Ambulatory Visit: Payer: Self-pay | Admitting: Cardiology

## 2016-10-23 DIAGNOSIS — I872 Venous insufficiency (chronic) (peripheral): Secondary | ICD-10-CM

## 2016-10-23 NOTE — Addendum Note (Signed)
Encounter addended by: Heywood Footman, RN on: 10/23/2016  9:05 AM<BR>    Actions taken: Visit Navigator Flowsheet section accepted

## 2016-10-29 ENCOUNTER — Telehealth (INDEPENDENT_AMBULATORY_CARE_PROVIDER_SITE_OTHER): Payer: Self-pay | Admitting: Orthopedic Surgery

## 2016-10-29 MED ORDER — DOXYCYCLINE HYCLATE 100 MG PO CAPS: 100.0000 mg | ORAL_CAPSULE | Freq: Two times a day (BID) | ORAL | 0 refills | Status: DC

## 2016-10-29 NOTE — Telephone Encounter (Signed)
Patient called advised he is out of town and have ulcers on both of his feet. Patient said he is having chills. Patient think he has an infection. Patient asked if he can get a Rx for Doxycycline called into the [facility] [address]. The phone number is [phone] The number to contact patient is [phone]

## 2016-10-29 NOTE — Telephone Encounter (Signed)
OK call in prescription for doxycycline for 2 weeks 100 mg twice a day

## 2016-10-29 NOTE — Telephone Encounter (Signed)
Sent to me by mistake  Please advise

## 2016-10-29 NOTE — Telephone Encounter (Signed)
Pt wife called to check status of request for antibiotic. Please check with [name] and call pt.

## 2016-10-29 NOTE — Telephone Encounter (Signed)
I called and left voicemail on pharmacy vm line, and this was sent in electronically through epic and I have called patient to make them aware as well. Advised him to follow up in office when he is back in town.

## 2016-11-14 ENCOUNTER — Ambulatory Visit (INDEPENDENT_AMBULATORY_CARE_PROVIDER_SITE_OTHER): Payer: Managed Care, Other (non HMO) | Admitting: Orthopedic Surgery

## 2016-11-15 ENCOUNTER — Encounter (INDEPENDENT_AMBULATORY_CARE_PROVIDER_SITE_OTHER): Payer: Self-pay | Admitting: Orthopedic Surgery

## 2016-11-15 ENCOUNTER — Ambulatory Visit (INDEPENDENT_AMBULATORY_CARE_PROVIDER_SITE_OTHER): Payer: Managed Care, Other (non HMO) | Admitting: Orthopedic Surgery

## 2016-11-15 VITALS — Ht 70.0 in | Wt 242.0 lb

## 2016-11-15 DIAGNOSIS — M79632 Pain in left forearm: Secondary | ICD-10-CM

## 2016-11-15 DIAGNOSIS — L97511 Non-pressure chronic ulcer of other part of right foot limited to breakdown of skin: Secondary | ICD-10-CM

## 2016-11-15 DIAGNOSIS — L97421 Non-pressure chronic ulcer of left heel and midfoot limited to breakdown of skin: Secondary | ICD-10-CM

## 2016-11-15 DIAGNOSIS — L97411 Non-pressure chronic ulcer of right heel and midfoot limited to breakdown of skin: Secondary | ICD-10-CM | POA: Insufficient documentation

## 2016-11-15 NOTE — Progress Notes (Signed)
Office Visit Note   Patient: [name]           Date of Birth: [birth year]           MRN: [id number] Visit Date: 11/15/2016              Requested by: Fanny Bien, [address], [place] [address] PCP: Fanny Bien, MD  Chief Complaint  Patient presents with  . Right Foot - Wound Check  . Left Foot - Wound Check      HPI: Patient is a 66 year old gentleman with diabetic insensate neuropathy complains of burning neuropathic pain which is helped with Lyrica and anti-inflammatories. Patient states she's developed a new ulcer on the plantar aspect the left foot base of the fifth metatarsal he states he feels this hurts more than the right foot. She has completed a course of doxycycline no signs or symptoms of infection at this time.  Assessment & Plan: Visit Diagnoses:  1. Non-pressure chronic ulcer of other part of right foot limited to breakdown of skin (HCC)   2. Pain in left forearm   3. Midfoot ulcer, left, limited to breakdown of skin ([place])     Plan: We will place a follow leaving donut to unload the base of the fifth metatarsal left foot. He will continue with antibiotic ointment and Band-Aid dressing changes daily. Ulcers were debrided 2 of skin and soft tissue back to healthy viable granulation tissue. Patient has shown interested in enrolling in the [place] study and he will call them to set this up.  Follow-Up Instructions: Return in about 3 weeks (around 12/06/2016).   Ortho Exam  Patient is alert, oriented, no adenopathy, well-dressed, normal affect, normal respiratory effort. Examination patient has palpable pulses bilaterally he has a cavovarus foot which overloads the base of the fifth metatarsal bilaterally. He has a new ulcer with nonviable tissue around the base of the fifth metatarsal left foot. After informed consent a 10 blade knife was used to debride the skin and soft tissue back to healthy viable granulation tissue for both the left  and right fifth metatarsal head ulcers. Silver nitrate was used for hemostasis of family was applied. The left ulcer measures 10 x 20 mm the right ulcer is 15 mm in diameter there are both 2 mm deep. There is no exposed bone or tendon. There is no ascending cellulitis.  Imaging: No results found.  Labs: Lab Results  Component Value Date   HGBA1C 6.0 (H) 12/24/2015   HGBA1C 7.0 (H) 12/25/2013   HGBA1C 10.0 (H) 09/15/2012   ESRSEDRATE 53 (H) 09/15/2012   REPTSTATUS 12/29/2015 FINAL 12/24/2015   CULT NO GROWTH 5 DAYS 12/24/2015    Orders:  No orders of the defined types were placed in this encounter.  No orders of the defined types were placed in this encounter.    Procedures: No procedures performed  Clinical Data: No additional findings.  ROS:  All other systems negative, except as noted in the HPI. Review of Systems  Objective: Vital Signs: Ht 5\' 10"  (1.778 m)   Wt 242 lb (109.8 kg)   BMI 34.72 kg/m   Specialty Comments:  No specialty comments available.  PMFS History: Patient Active Problem List   Diagnosis Date Noted  . Midfoot ulcer, left, limited to breakdown of skin ([place]) 11/15/2016  . Lateral epicondylitis, left elbow 08/12/2016  . Cellulitis of fifth toe of right foot 07/29/2016  . Prostate cancer (  [place]) 06/19/2016  . Non-pressure chronic ulcer of other part of right foot limited to breakdown of skin ([place]) 04/04/2016  . Cellulitis of leg, right 12/24/2015  . Acute kidney injury superimposed on CKD ([place]) 12/24/2015  . Cellulitis of right lower extremity 12/24/2015  . CKD (chronic kidney disease), stage III 12/25/2013  . Foot infection 12/24/2013  . Foot ulcer, left ([place]) 12/24/2013  . Cellulitis 09/14/2012  . Diabetic foot ulcer ([place]) 09/14/2012  . Gout 09/14/2012  . HTN (hypertension) 09/14/2012  . Hyperlipidemia 09/14/2012  . Hypothyroidism 09/14/2012  . CAD (coronary artery disease) 09/14/2012  . Diabetes mellitus ([place]) 09/14/2012  .  Hypercholesteremia   . Neuropathy [facility])    Past Medical History:  Diagnosis Date  . Coronary artery disease   . Diabetes mellitus without complication ([place])   . Hypercholesteremia   . Hypertension   . Neuropathy   . Prostate cancer [facility])     Family History  Problem Relation Age of Onset  . Diabetes Mellitus II Mother   . Diabetes Mellitus II Father   . CAD Father   . Cancer Father        prostate    Past Surgical History:  Procedure Laterality Date  . AMPUTATION Left 12/25/2013   Procedure: AMPUTATION RAY LEFT 5TH RAY;  Surgeon: [name], MD;  Location: [facility];  Service: Orthopedics;  Laterality: Left;  . BACK SURGERY    . CHOLECYSTECTOMY    . CORONARY ARTERY BYPASS GRAFT     Social History   Occupational History  . Not on file.   Social History Main Topics  . Smoking status: Former Smoker    Types: Cigars    Quit date: 12/24/1988  . Smokeless tobacco: Never Used  . Alcohol use No  . Drug use: No  . Sexual activity: Yes    Partners: Female

## 2016-12-02 ENCOUNTER — Telehealth (INDEPENDENT_AMBULATORY_CARE_PROVIDER_SITE_OTHER): Payer: Self-pay | Admitting: Orthopedic Surgery

## 2016-12-02 NOTE — Telephone Encounter (Signed)
Called patient left message on voicemail  appt has been canceled for 12/06/16 - need to call back to reschedule appt.

## 2016-12-02 NOTE — Telephone Encounter (Signed)
Returned call to patient left message to call back to reschedule appointment  with Dr [name]

## 2016-12-05 ENCOUNTER — Inpatient Hospital Stay
Admission: RE | Admit: 2016-12-05 | Discharge: 2016-12-05 | Disposition: A | Discharge status: Home / Self Care | Payer: Managed Care, Other (non HMO) | Source: Ambulatory Visit | Attending: Cardiology | Admitting: Cardiology

## 2016-12-05 ENCOUNTER — Inpatient Hospital Stay: Admission: RE | Admit: 2016-12-05 | Payer: Managed Care, Other (non HMO) | Source: Ambulatory Visit

## 2016-12-06 ENCOUNTER — Ambulatory Visit (INDEPENDENT_AMBULATORY_CARE_PROVIDER_SITE_OTHER): Payer: Managed Care, Other (non HMO) | Admitting: Orthopedic Surgery

## 2016-12-10 ENCOUNTER — Telehealth (INDEPENDENT_AMBULATORY_CARE_PROVIDER_SITE_OTHER): Payer: Self-pay | Admitting: Radiology

## 2016-12-10 NOTE — Telephone Encounter (Signed)
Dr. Ernie Hew ordered cultures on patient, positive for pseudomonas aeruginosa heavy growth. Cipro 500 bid x 2 weeks. Need to call patient and medical doctor to see if they started abx tx, otherwise we will.

## 2016-12-11 NOTE — Telephone Encounter (Signed)
Spoke with patient to advise him we did receive his wound culture from Dr. Theone Murdoch office. Wanting to know what anitbiotic regimen he was put on. 8/30 he was started on levaquin and metronidzaole (flagyl). Dr. [name] is recommending cipro 500mg  1 po bid x 2 weeks. He is actually coming in tomorrow to see Dr. [name] at [place] [facility] and can discuss antibiotics at that time.

## 2016-12-12 ENCOUNTER — Encounter (INDEPENDENT_AMBULATORY_CARE_PROVIDER_SITE_OTHER): Payer: Self-pay | Admitting: Orthopedic Surgery

## 2016-12-12 ENCOUNTER — Ambulatory Visit (INDEPENDENT_AMBULATORY_CARE_PROVIDER_SITE_OTHER): Payer: Managed Care, Other (non HMO) | Admitting: Orthopedic Surgery

## 2016-12-12 DIAGNOSIS — M79632 Pain in left forearm: Secondary | ICD-10-CM | POA: Diagnosis not present

## 2016-12-12 DIAGNOSIS — L97421 Non-pressure chronic ulcer of left heel and midfoot limited to breakdown of skin: Secondary | ICD-10-CM | POA: Diagnosis not present

## 2016-12-12 DIAGNOSIS — L97511 Non-pressure chronic ulcer of other part of right foot limited to breakdown of skin: Secondary | ICD-10-CM

## 2016-12-12 DIAGNOSIS — M7989 Other specified soft tissue disorders: Secondary | ICD-10-CM

## 2016-12-12 NOTE — Progress Notes (Signed)
Office Visit Note   Patient: [name]           Date of Birth: January 08, 1951           MRN: [id number] Visit Date: 12/12/2016              Requested by: Fanny Bien, [name] [place], [place] [address] PCP: Fanny Bien, MD  Chief Complaint  Patient presents with  . Right Foot - Wound Check  . Left Foot - Wound Check      HPI: Patient is a 66 year old gentleman with insensate neuropathy bilateral lower extremities with Wagner grade 1 ulcers beneath the fifth metatarsal head bilaterally. Initial tissues cultures were positive for Pseudomonas for the right foot ulcer and he has completed a one-week course of Levaquin and Flagyl. Patient continues to complain of bone pain on the ulnar border of the left forearm. Patient states that he has pain that wakes him up at night pain during the day and has pain when he lifts Friday Saturday and Sunday. Previous plain radiographs were inconclusive for any type of bone lesion.  Assessment & Plan: Visit Diagnoses:  1. Pain and swelling of left forearm   2. Midfoot ulcer, left, limited to breakdown of skin ([place])   3. Non-pressure chronic ulcer of other part of right foot limited to breakdown of skin [place])     Plan: We'll request an MRI scan to further evaluate the left forearm for possible bone malignancy.  Follow-Up Instructions: Return in about 3 weeks (around 01/02/2017).   Ortho Exam  Patient is alert, oriented, no adenopathy, well-dressed, normal affect, normal respiratory effort. Examination patient has venous stasis swelling in both legs with brawny skin color changes recommended that he wear his compression stockings. Examination of both feet he has a Wagner grade 1 ulcer beneath the fifth metatarsal head bilaterally. After informed consent a 10 blade knife was used to debride the skin and soft tissue on both feet back to bleeding viable granulation tissue. This was touched with silver nitrate and a Band-Aid was  applied. The wounds are 10 mm in diameter and 1 mm deep the ulcers do not probe to bone or tendon. There is no cellulitis on either foot no drainage no odor no clinical signs of infection.  Examination left forearm the muscles are nontender to palpation the origin of the flexor and extensor muscles are nontender to palpation the subcutaneous border of the ulna at the junction of the proximal and middle third is tender to palpation along the subcutaneous border of the bone. The muscles are nontender to palpation the muscle origins are nontender to palpation. Patient is only tender to palpation directly over the bone.  Imaging: No results found. No images are attached to the encounter.  Labs: Lab Results  Component Value Date   HGBA1C 6.0 (H) 12/24/2015   HGBA1C 7.0 (H) 12/25/2013   HGBA1C 10.0 (H) 09/15/2012   ESRSEDRATE 53 (H) 09/15/2012   REPTSTATUS 12/29/2015 FINAL 12/24/2015   CULT NO GROWTH 5 DAYS 12/24/2015    Orders:  Orders Placed This Encounter  Procedures  . MR FOREARM LEFT WO CONTRAST   No orders of the defined types were placed in this encounter.    Procedures: No procedures performed  Clinical Data: No additional findings.  ROS:  All other systems negative, except as noted in the HPI. Review of Systems  Objective: Vital Signs: There were no vitals taken for this visit.  Specialty  Comments:  No specialty comments available.  PMFS History: Patient Active Problem List   Diagnosis Date Noted  . Midfoot ulcer, left, limited to breakdown of skin ([place]) 11/15/2016  . Lateral epicondylitis, left elbow 08/12/2016  . Cellulitis of fifth toe of right foot 07/29/2016  . Prostate cancer ([place]) 06/19/2016  . Non-pressure chronic ulcer of other part of right foot limited to breakdown of skin ([place]) 04/04/2016  . Cellulitis of leg, right 12/24/2015  . Acute kidney injury superimposed on CKD ([place]) 12/24/2015  . Cellulitis of right lower extremity 12/24/2015  . CKD  (chronic kidney disease), stage III 12/25/2013  . Foot infection 12/24/2013  . Foot ulcer, left ([place]) 12/24/2013  . Cellulitis 09/14/2012  . Diabetic foot ulcer ([place]) 09/14/2012  . Gout 09/14/2012  . HTN (hypertension) 09/14/2012  . Hyperlipidemia 09/14/2012  . Hypothyroidism 09/14/2012  . CAD (coronary artery disease) 09/14/2012  . Diabetes mellitus ([place]) 09/14/2012  . Hypercholesteremia   . Neuropathy [facility])    Past Medical History:  Diagnosis Date  . Coronary artery disease   . Diabetes mellitus without complication ([place])   . Hypercholesteremia   . Hypertension   . Neuropathy   . Prostate cancer [facility])     Family History  Problem Relation Age of Onset  . Diabetes Mellitus II Mother   . Diabetes Mellitus II Father   . CAD Father   . Cancer Father        prostate    Past Surgical History:  Procedure Laterality Date  . AMPUTATION Left 12/25/2013   Procedure: AMPUTATION RAY LEFT 5TH RAY;  Surgeon: [name], MD;  Location: [facility];  Service: Orthopedics;  Laterality: Left;  . BACK SURGERY    . CHOLECYSTECTOMY    . CORONARY ARTERY BYPASS GRAFT     Social History   Occupational History  . Not on file.   Social History Main Topics  . Smoking status: Former Smoker    Types: Cigars    Quit date: 12/24/1988  . Smokeless tobacco: Never Used  . Alcohol use No  . Drug use: No  . Sexual activity: Yes    Partners: Female

## 2016-12-13 ENCOUNTER — Other Ambulatory Visit (INDEPENDENT_AMBULATORY_CARE_PROVIDER_SITE_OTHER): Payer: Self-pay | Admitting: Family

## 2016-12-13 MED ORDER — CIPROFLOXACIN HCL 500 MG PO TABS
500.0000 mg | ORAL_TABLET | Freq: Two times a day (BID) | ORAL | 0 refills | Status: AC
Start: 2016-12-13 — End: 2016-12-27

## 2016-12-13 NOTE — Progress Notes (Unsigned)
ciprofl

## 2016-12-16 ENCOUNTER — Ambulatory Visit (INDEPENDENT_AMBULATORY_CARE_PROVIDER_SITE_OTHER): Payer: Managed Care, Other (non HMO) | Admitting: Orthopedic Surgery

## 2016-12-24 ENCOUNTER — Ambulatory Visit
Admission: RE | Admit: 2016-12-24 | Discharge: 2016-12-24 | Disposition: A | Discharge status: Home / Self Care | Payer: Medicare Other | Source: Ambulatory Visit | Attending: Orthopedic Surgery | Admitting: Orthopedic Surgery

## 2016-12-24 DIAGNOSIS — M7989 Other specified soft tissue disorders: Secondary | ICD-10-CM

## 2016-12-24 DIAGNOSIS — M79632 Pain in left forearm: Secondary | ICD-10-CM

## 2016-12-25 ENCOUNTER — Ambulatory Visit
Admission: RE | Admit: 2016-12-25 | Discharge: 2016-12-25 | Disposition: A | Discharge status: Home / Self Care | Payer: Managed Care, Other (non HMO) | Source: Ambulatory Visit | Attending: Cardiology | Admitting: Cardiology

## 2016-12-25 DIAGNOSIS — I872 Venous insufficiency (chronic) (peripheral): Secondary | ICD-10-CM

## 2016-12-25 NOTE — Consult Note (Signed)
Chief Complaint: Patient was seen in consultation today for  Chief Complaint  Patient presents with  . Advice Only    E & M for Venous Insufficiency   at the request of Ganji,Jay  Referring Physician(s): Ganji,Jay  History of Present Illness: [name] is a 66 y.o. male with a long history of lower extremity swelling. He is a diabetic, and is status post amputation of the left small toe, who presents with leg and ankle skin discoloration, episodic swelling, and bilateral foot ulcers. The ulcers are on the lateral feet bilaterally. His dressings are in place. When asked, he also complains of pain in his calves, tiredness, and does describe spider veins. He doesn't have a history of varicose veins in his family through his mother. He has not been treated for venous insufficiency in the past as far as procedures. He was given knee-high graded compression stockings. The symptoms do interfere with his activities of daily living.  Past Medical History:  Diagnosis Date  . Coronary artery disease   . Diabetes mellitus without complication ([place])   . Hypercholesteremia   . Hypertension   . Neuropathy   . Prostate cancer [facility])     Past Surgical History:  Procedure Laterality Date  . AMPUTATION Left 12/25/2013   Procedure: AMPUTATION RAY LEFT 5TH RAY;  Surgeon: [name], MD;  Location: [facility];  Service: Orthopedics;  Laterality: Left;  . BACK SURGERY    . CHOLECYSTECTOMY    . CORONARY ARTERY BYPASS GRAFT      Allergies: Mushroom extract complex  Medications: Prior to Admission medications   Medication Sig Start Date End Date Taking? Authorizing Provider  allopurinol (ZYLOPRIM) 300 MG tablet Take 300 mg by mouth daily.   Yes [provider]  amLODipine (NORVASC) 10 MG tablet Take 10 mg by mouth at bedtime. 11/03/15  Yes [provider]  aspirin EC 81 MG tablet Take 81 mg by mouth at bedtime.    Yes [provider]  carvedilol (COREG) 6.25 MG tablet  Take 6.25 mg by mouth 2 (two) times daily. 10/27/15  Yes [provider]  diclofenac sodium (VOLTAREN) 1 % GEL Apply 1 application topically daily.   Yes [provider]  Esomeprazole Magnesium (NEXIUM PO) Take 22.3 mg by mouth daily.   Yes [provider]  ezetimibe (ZETIA) 10 MG tablet Take 10 mg by mouth daily.   Yes [provider]  Insulin Degludec (TRESIBA FLEXTOUCH) 200 UNIT/ML SOPN Inject 40 Units into the skin at bedtime. 12/27/15  Yes Domenic Polite, MD  levothyroxine (SYNTHROID, LEVOTHROID) 112 MCG tablet Take 112 mcg by mouth daily before breakfast.   Yes [provider]  LYRICA 150 MG capsule  12/09/16  Yes [provider]  OVER THE COUNTER MEDICATION Take 1 tablet by mouth See admin instructions. Take 1 tablet by mouth every 2 hours during the night as needed for leg cramps: Hyland's Leg Cramps PM   Yes [provider]  pregabalin (LYRICA) 75 MG capsule Take 1 capsule (75 mg total) by mouth at bedtime. 12/27/15  Yes Domenic Polite, MD  rosuvastatin (CRESTOR) 10 MG tablet Take 10 mg by mouth daily.   Yes [provider]  sildenafil (VIAGRA) 25 MG tablet Take 25 mg by mouth daily as needed for erectile dysfunction.   Yes [provider]  tadalafil (CIALIS) 5 MG tablet Take 5 mg by mouth daily as needed for erectile dysfunction.   Yes [provider]  triamcinolone (  KENALOG) 0.025 % cream Apply 1 application topically daily as needed (psoriasis).   Yes [provider]  carvedilol (COREG) 25 MG tablet  11/22/16   [provider]  ciprofloxacin (CIPRO) 500 MG tablet Take 1 tablet (500 mg total) by mouth 2 (two) times daily. Patient not taking: Reported on 12/25/2016 12/13/16 12/27/16  Suzan Slick, NP  doxycycline (VIBRA-TABS) 100 MG tablet Take 1 tablet (100 mg total) by mouth 2 (two) times daily. Patient not taking: Reported on 12/12/2016 07/29/16   [name], MD  doxycycline  (VIBRAMYCIN) 100 MG capsule Take 1 capsule (100 mg total) by mouth 2 (two) times daily. Patient not taking: Reported on 12/25/2016 10/29/16   [name], MD  guaiFENesin-codeine 100-10 MG/5ML syrup Take 5-10 mLs by mouth every 4 (four) hours as needed for cough. Patient not taking: Reported on 12/25/2016 08/15/16   Tyler Pita, MD  levofloxacin [facility]) 750 MG tablet  12/05/16   [provider]  metroNIDAZOLE (FLAGYL) 500 MG tablet  12/05/16   [provider]  tamsulosin (FLOMAX) 0.4 MG CAPS capsule Take 1 capsule (0.4 mg total) by mouth daily after supper. Patient not taking: Reported on 12/25/2016 08/23/16   Tyler Pita, MD     Family History  Problem Relation Age of Onset  . Diabetes Mellitus II Mother   . Diabetes Mellitus II Father   . CAD Father   . Cancer Father        prostate    Social History   Social History  . Marital status: Married    Spouse name: N/A  . Number of children: N/A  . Years of education: N/A   Social History Main Topics  . Smoking status: Former Smoker    Types: Cigars    Quit date: 12/24/1988  . Smokeless tobacco: Never Used  . Alcohol use No  . Drug use: No  . Sexual activity: Yes    Partners: Female   Other Topics Concern  . Not on file   Social History Narrative   Regular exercise: yes 3 times a week   Caffeine use: hot tea     Review of Systems: A 12 point ROS discussed and pertinent positives are indicated in the HPI above.  All other systems are negative.  Review of Systems  Vital Signs: BP (!) 107/55   Pulse 71   Temp 98.2 F (36.8 C) (Oral)   Resp 15   Ht 5\' 10"  (1.778 m)   Wt 236 lb (107 kg)   SpO2 96%   BMI 33.86 kg/m   Physical Exam  Constitutional: He is oriented to person, place, and time. He appears well-developed and well-nourished.  Cardiovascular:  Dorsalis pedis and posterior tibial pulses are 2+ bilaterally.  Musculoskeletal:  Examination of the left lower extremity demonstrates  discoloration from the mid calf to the ankle. There are no varicosities. A wound on the lateral foot is noted with a dressing in place.  Examination of the right lower extremity demonstrates discoloration from the mid calf to the ankle. There are no varicosities. A wound dressing is in place at the lateral foot.  Neurological: He is alert and oriented to person, place, and time.     Imaging: Mr Forearm Left Wo Contrast  Result Date: 12/25/2016 CLINICAL DATA:  Left elbow and forearm pain with numbness and swelling for 2 years. Previous injury. EXAM: MRI OF THE LEFT FOREARM WITHOUT CONTRAST TECHNIQUE: Multiplanar, multisequence MR imaging of the left forearm was performed.  No intravenous contrast was administered. COMPARISON:  Radiographs 09/19/2016 and 09/30/2016. FINDINGS: Bones/Joint/Cartilage Images extend from the elbow through the distal forearm. The wrist is incompletely visualized. Capsules were placed dorsally in the proximal forearm. No evidence of acute fracture, dislocation or focal osseous lesion. There are mild degenerative changes at the elbow. There is no significant elbow joint effusion. Ligaments Not relevant for exam/indication. The collateral ligaments appear intact at the elbow. Muscles and Tendons There is T2 hyperintensity and fatty atrophy within the anconeus muscle. The forearm musculature otherwise appears unremarkable. No tendon abnormalities are seen. The biceps, brachialis and triceps tendons appear normal. Soft tissues No evidence of soft tissue mass, fluid collection or focal inflammation. IMPRESSION: 1. T2 hyperintensity and fatty atrophy within the anconeus muscle, suggesting subacute denervation. 2. No focal mass lesion or acute findings demonstrated. 3. Mild degenerative changes at the elbow. Electronically Signed   By: Richardean Sale M.D.   On: 12/25/2016 09:46   [place] Rad Eval And Mgmt  Result Date: 12/25/2016 Please refer to "Notes" to see consult details.  Venous  Doppler study was performed today. There is no evidence of DVT. There is also no evidence of reflux in the right lower extremity. There is mild reflux in the left greater saphenous vein in the distal thigh and within the calf only. No varicosities are identified by ultrasound.   Labs:  CBC:  Recent Labs  12/27/15 0703  WBC 7.0  HGB 11.3*  HCT 34.3*  PLT 159    COAGS: No results for input(s): INR, APTT in the last 8760 hours.  BMP:  Recent Labs  12/27/15 0706  NA 138  K 3.7  CL 107  CO2 21*  GLUCOSE 124*  BUN 21*  CALCIUM 8.6*  CREATININE 2.52*  GFRNONAA 25*  GFRAA 29*    LIVER FUNCTION TESTS: No results for input(s): BILITOT, AST, ALT, ALKPHOS, PROT, ALBUMIN in the last 8760 hours.  TUMOR MARKERS: No results for input(s): AFPTM, CEA, CA199, CHROMGRNA in the last 8760 hours.  Assessment and Plan:  Mr. Leonette Monarch does have skin discoloration and edema bilaterally but his venous insufficiency is only noted in the distal left greater saphenous vein. This is probably contributing to but is not the primary cause of his foot wounds bilaterally. He was instructed continue wearing his knee-high graded compression stockings and will follow-up at the six-month interval. His foot wounds are probably related to diabetic small vessel disease. Please note that his ankle pulses are bounding and intact.  Thank you for this interesting consult.  I greatly enjoyed meeting Real Cona and look forward to participating in their care.  A copy of this report was sent to the requesting provider on this date.  Electronically Signed: Crosley Stejskal, ART A 12/25/2016, 10:04 AM   I spent a total of  40 Minutes   in face to face in clinical consultation, greater than 50% of which was counseling/coordinating care for lower extremity venous insufficiency.

## 2016-12-30 ENCOUNTER — Ambulatory Visit (INDEPENDENT_AMBULATORY_CARE_PROVIDER_SITE_OTHER): Payer: Managed Care, Other (non HMO) | Admitting: Orthopedic Surgery

## 2016-12-30 ENCOUNTER — Ambulatory Visit (INDEPENDENT_AMBULATORY_CARE_PROVIDER_SITE_OTHER): Payer: Managed Care, Other (non HMO)

## 2016-12-30 ENCOUNTER — Encounter (INDEPENDENT_AMBULATORY_CARE_PROVIDER_SITE_OTHER): Payer: Self-pay | Admitting: Orthopedic Surgery

## 2016-12-30 DIAGNOSIS — M4712 Other spondylosis with myelopathy, cervical region: Secondary | ICD-10-CM | POA: Diagnosis not present

## 2016-12-30 MED ORDER — PREDNISONE 10 MG PO TABS: 20.0000 mg | ORAL_TABLET | Freq: Every day | ORAL | 0 refills | Status: DC

## 2016-12-30 NOTE — Progress Notes (Signed)
Office Visit Note   Patient: [name]           Date of Birth: March 19, 1951           MRN: [id number] Visit Date: 12/30/2016              Requested by: Fanny Bien, [address] [place], [place] [address] PCP: Fanny Bien, MD  Chief Complaint  Patient presents with  . Right Foot - Follow-up, Pain  . Left Foot - Follow-up  . Left Forearm - Follow-up, Pain      HPI: Patient is a 66 year old gentleman who still has the elbow pain status post MRI scan he states that over the last few days he has developed weakness in his left hand he states he cannot button or unbutton his pants due to the weakness in his left thumb. Patient states that his right foot still has some drainage he states he has not been doing heel cord stretching.  Assessment & Plan: Visit Diagnoses:  1. Other spondylosis with myelopathy, cervical region     Plan: Recommended Achilles stretching on the right to unload the lateral aspect of the right foot. We will start him on a prednisone dose pack to help with the myelopathy in the left upper extremity. We'll obtain an MRI scan to evaluate cervical spine.  Follow-Up Instructions: Return in about 3 weeks (around 01/20/2017).   Ortho Exam  Patient is alert, oriented, no adenopathy, well-dressed, normal affect, normal respiratory effort. Examination patient's MRI scan of the left elbow shows some denervation of the anconeous muscle most likely due to the muscle pain. No bony abnormalities no masses were identified.  Examination of right foot patient has increased Achilles contracture with dorsiflexion short of neutral. The ulcer over the base of the fifth metatarsal is superficial and is approximately 2 mm x 10 mm.  Examination of upper extremities patient has essentially the same motor strength in both upper extremities grip strength appears approximately 4.5 over 5 in the left hand compared to the right. Thoracic outlet is nontender to  palpation. Radiographs show degenerative arthritic changes of the cervical spine.  Imaging: Xr Cervical Spine 2 Or 3 Views  Result Date: 12/30/2016 Two-view radiographs of the cervical spine shows osteoarthritic changes throughout the cervical spine with para-articular bone spurs as well as joint space narrowing.  No images are attached to the encounter.  Labs: Lab Results  Component Value Date   HGBA1C 6.0 (H) 12/24/2015   HGBA1C 7.0 (H) 12/25/2013   HGBA1C 10.0 (H) 09/15/2012   ESRSEDRATE 53 (H) 09/15/2012   REPTSTATUS 12/29/2015 FINAL 12/24/2015   CULT NO GROWTH 5 DAYS 12/24/2015    Orders:  Orders Placed This Encounter  Procedures  . XR Cervical Spine 2 or 3 views  . MR Cervical Spine w/o contrast   Meds ordered this encounter  Medications  . predniSONE (DELTASONE) 10 MG tablet    Sig: Take 2 tablets (20 mg total) by mouth daily with breakfast.    Dispense:  60 tablet    Refill:  0     Procedures: No procedures performed  Clinical Data: No additional findings.  ROS:  All other systems negative, except as noted in the HPI. Review of Systems  Objective: Vital Signs: There were no vitals taken for this visit.  Specialty Comments:  No specialty comments available.  PMFS History: Patient Active Problem List   Diagnosis Date Noted  . Midfoot ulcer, left, limited to  breakdown of skin ([place]) 11/15/2016  . Lateral epicondylitis, left elbow 08/12/2016  . Cellulitis of fifth toe of right foot 07/29/2016  . Prostate cancer ([place]) 06/19/2016  . Non-pressure chronic ulcer of other part of right foot limited to breakdown of skin ([place]) 04/04/2016  . Cellulitis of leg, right 12/24/2015  . Acute kidney injury superimposed on CKD ([place]) 12/24/2015  . Cellulitis of right lower extremity 12/24/2015  . CKD (chronic kidney disease), stage III 12/25/2013  . Foot infection 12/24/2013  . Foot ulcer, left ([place]) 12/24/2013  . Cellulitis 09/14/2012  . Diabetic foot ulcer ([place])  09/14/2012  . Gout 09/14/2012  . HTN (hypertension) 09/14/2012  . Hyperlipidemia 09/14/2012  . Hypothyroidism 09/14/2012  . CAD (coronary artery disease) 09/14/2012  . Diabetes mellitus ([place]) 09/14/2012  . Hypercholesteremia   . Neuropathy [facility])    Past Medical History:  Diagnosis Date  . Coronary artery disease   . Diabetes mellitus without complication ([place])   . Hypercholesteremia   . Hypertension   . Neuropathy   . Prostate cancer [facility])     Family History  Problem Relation Age of Onset  . Diabetes Mellitus II Mother   . Diabetes Mellitus II Father   . CAD Father   . Cancer Father        prostate    Past Surgical History:  Procedure Laterality Date  . AMPUTATION Left 12/25/2013   Procedure: AMPUTATION RAY LEFT 5TH RAY;  Surgeon: [name], MD;  Location: [facility];  Service: Orthopedics;  Laterality: Left;  . BACK SURGERY    . CHOLECYSTECTOMY    . CORONARY ARTERY BYPASS GRAFT     Social History   Occupational History  . Not on file.   Social History Main Topics  . Smoking status: Former Smoker    Types: Cigars    Quit date: 12/24/1988  . Smokeless tobacco: Never Used  . Alcohol use No  . Drug use: No  . Sexual activity: Yes    Partners: Female

## 2017-01-20 ENCOUNTER — Ambulatory Visit
Admission: RE | Admit: 2017-01-20 | Discharge: 2017-01-20 | Disposition: A | Discharge status: Home / Self Care | Payer: Self-pay | Source: Ambulatory Visit | Attending: Orthopedic Surgery | Admitting: Orthopedic Surgery

## 2017-01-20 DIAGNOSIS — M4712 Other spondylosis with myelopathy, cervical region: Secondary | ICD-10-CM

## 2017-01-24 ENCOUNTER — Other Ambulatory Visit: Payer: Self-pay | Admitting: Radiology

## 2017-01-26 ENCOUNTER — Other Ambulatory Visit (INDEPENDENT_AMBULATORY_CARE_PROVIDER_SITE_OTHER): Payer: Self-pay | Admitting: Orthopedic Surgery

## 2017-01-27 ENCOUNTER — Encounter (INDEPENDENT_AMBULATORY_CARE_PROVIDER_SITE_OTHER): Payer: Self-pay | Admitting: Orthopedic Surgery

## 2017-01-27 ENCOUNTER — Ambulatory Visit (INDEPENDENT_AMBULATORY_CARE_PROVIDER_SITE_OTHER): Payer: 59 | Admitting: Orthopedic Surgery

## 2017-01-27 DIAGNOSIS — M4722 Other spondylosis with radiculopathy, cervical region: Secondary | ICD-10-CM | POA: Diagnosis not present

## 2017-01-27 DIAGNOSIS — L97511 Non-pressure chronic ulcer of other part of right foot limited to breakdown of skin: Secondary | ICD-10-CM | POA: Diagnosis not present

## 2017-01-27 NOTE — Progress Notes (Signed)
Office Visit Note   Patient: [name]           Date of Birth: 05-24-1950           MRN: [id number] Visit Date: 01/27/2017              Requested by: Fanny Bien, [address] [place], [place] [address] PCP: Fanny Bien, MD  Chief Complaint  Patient presents with  . Neck - Follow-up    MRI review cervical spine      HPI: Follow-up for his cervical spine and right foot.  Patient states that he is having burning pain along the medial scapular border on the left side and has radicular pain down the left upper extremity and has weakness in the left upper extremity.  Patient states that he is wearing his custom orthotics with spacer but is having a difficult time healing the ulcer beneath the fifth metatarsal head right foot  Assessment & Plan: Visit Diagnoses:  1. Other spondylosis with radiculopathy, cervical region   2. Non-pressure chronic ulcer of other part of right foot limited to breakdown of skin ([place])     Plan: Patient will continue with his orthotics and pressure loading continued heel cord stretching to unload the base of the fifth metatarsal.  We will set him up with a neurosurgical evaluation with Dr. Duffy Bruce for evaluation of his cervical spine.  Follow-Up Instructions: Return in about 4 weeks (around 02/24/2017).   Ortho Exam  Patient is alert, oriented, no adenopathy, well-dressed, normal affect, normal respiratory effort. Examination of the right foot patient has healthy granulation tissue the wound is approximately 5 mm x 10 mm and 1 mm deep there is some mild maceration around the edges.  Patient does have heel cord tightness with dorsiflexion only to about neutral.  Examination of the cervical spine he does have thoracic tenderness on the left.  Motor strength is slightly decreased for grip strength on the left.  Shoulder abduction flexion elbow flexion extension and grip strength appears symmetric.  Review of the MRI scan shows advanced  degenerative disc disease of the cervical spine with severe stenosis.  Imaging: No results found. No images are attached to the encounter.  Labs: Lab Results  Component Value Date   HGBA1C 6.0 (H) 12/24/2015   HGBA1C 7.0 (H) 12/25/2013   HGBA1C 10.0 (H) 09/15/2012   ESRSEDRATE 53 (H) 09/15/2012   REPTSTATUS 12/29/2015 FINAL 12/24/2015   CULT NO GROWTH 5 DAYS 12/24/2015    Orders:  Orders Placed This Encounter  Procedures  . Ambulatory referral to Neurosurgery   No orders of the defined types were placed in this encounter.    Procedures: No procedures performed  Clinical Data: No additional findings.  ROS:  All other systems negative, except as noted in the HPI. Review of Systems  Objective: Vital Signs: There were no vitals taken for this visit.  Specialty Comments:  No specialty comments available.  PMFS History: Patient Active Problem List   Diagnosis Date Noted  . Other spondylosis with radiculopathy, cervical region 01/27/2017  . Midfoot ulcer, left, limited to breakdown of skin ([place]) 11/15/2016  . Lateral epicondylitis, left elbow 08/12/2016  . Cellulitis of fifth toe of right foot 07/29/2016  . Prostate cancer ([place]) 06/19/2016  . Non-pressure chronic ulcer of other part of right foot limited to breakdown of skin ([place]) 04/04/2016  . Cellulitis of leg, right 12/24/2015  . Acute kidney injury superimposed on CKD ([place]) 12/24/2015  .  Cellulitis of right lower extremity 12/24/2015  . CKD (chronic kidney disease), stage III ([place]) 12/25/2013  . Foot infection 12/24/2013  . Foot ulcer, left ([place]) 12/24/2013  . Cellulitis 09/14/2012  . Diabetic foot ulcer ([place]) 09/14/2012  . Gout 09/14/2012  . HTN (hypertension) 09/14/2012  . Hyperlipidemia 09/14/2012  . Hypothyroidism 09/14/2012  . CAD (coronary artery disease) 09/14/2012  . Diabetes mellitus ([place]) 09/14/2012  . Hypercholesteremia   . Neuropathy [facility])    Past Medical History:  Diagnosis Date  .  Coronary artery disease   . Diabetes mellitus without complication ([place])   . Hypercholesteremia   . Hypertension   . Neuropathy   . Prostate cancer [facility])     Family History  Problem Relation Age of Onset  . Diabetes Mellitus II Mother   . Diabetes Mellitus II Father   . CAD Father   . Cancer Father        prostate    Past Surgical History:  Procedure Laterality Date  . AMPUTATION Left 12/25/2013   Procedure: AMPUTATION RAY LEFT 5TH RAY;  Surgeon: [name], MD;  Location: [facility];  Service: Orthopedics;  Laterality: Left;  . BACK SURGERY    . CHOLECYSTECTOMY    . CORONARY ARTERY BYPASS GRAFT     Social History   Occupational History  . Not on file.   Social History Main Topics  . Smoking status: Former Smoker    Types: Cigars    Quit date: 12/24/1988  . Smokeless tobacco: Never Used  . Alcohol use No  . Drug use: No  . Sexual activity: Yes    Partners: Female

## 2017-03-04 ENCOUNTER — Telehealth: Payer: Self-pay | Admitting: Radiation Oncology

## 2017-03-04 NOTE — Telephone Encounter (Signed)
Received fax refill request for tamsulosin from [facility] pharmacy. Patient was released to Dr. [name] in July thus refill was not authorization. Provided a note encouraging the pharmacy to contact urology for refill. Fax confirmation that response was delivered obtained.

## 2017-03-10 ENCOUNTER — Ambulatory Visit (INDEPENDENT_AMBULATORY_CARE_PROVIDER_SITE_OTHER): Payer: 59 | Admitting: Orthopedic Surgery

## 2017-03-20 ENCOUNTER — Encounter (INDEPENDENT_AMBULATORY_CARE_PROVIDER_SITE_OTHER): Payer: Self-pay | Admitting: Orthopedic Surgery

## 2017-03-20 ENCOUNTER — Ambulatory Visit (INDEPENDENT_AMBULATORY_CARE_PROVIDER_SITE_OTHER): Payer: 59 | Admitting: Orthopedic Surgery

## 2017-03-20 DIAGNOSIS — L97511 Non-pressure chronic ulcer of other part of right foot limited to breakdown of skin: Secondary | ICD-10-CM | POA: Diagnosis not present

## 2017-03-20 NOTE — Progress Notes (Signed)
Office Visit Note   Patient: [name]           Date of Birth: 08/06/1950           MRN: [id number] Visit Date: 03/20/2017              Requested by: Fanny Bien, [address], [place] [address] PCP: Fanny Bien, MD  Chief Complaint  Patient presents with  . Right Foot - Follow-up      HPI: Patient is a 66 year old gentleman with insensate neuropathy with a Waggoner grade 1 ulcer beneath the fifth metatarsal head.  Patient states the ulcer has gotten larger he is status post anterior cervical discectomy and fusion about a month ago he states he still has arm pain and weakness.  Assessment & Plan: Visit Diagnoses:  1. Non-pressure chronic ulcer of other part of right foot limited to breakdown of skin ([place])     Plan: We will have him use a Band-Aid and a felt relieving donut to unload pressure from the ulcer discussed the importance of protected weightbearing.  Use the felt relieving donut at all time  Follow-Up Instructions: Return in about 3 weeks (around 04/10/2017).   Ortho Exam  Patient is alert, oriented, no adenopathy, well-dressed, normal affect, normal respiratory effort. Examination patient has a good pulse his foot is plantigrade he has a prominent fifth metatarsal base he has a large area of hyper granulation tissue callus and ulceration over the base of the fifth metatarsal right foot.  After informed consent a 10 blade knife was used to debride the skin and soft tissue back to bleeding viable granulation tissue.  There is no abscess no exposed bone no exposed tendon silver nitrate was used for hemostasis the ulcer is 2 x 3 cm and 3 mm deep.  A Band-Aid and a felt relieving donut was applied.  Imaging: No results found. No images are attached to the encounter.  Labs: Lab Results  Component Value Date   HGBA1C 6.0 (H) 12/24/2015   HGBA1C 7.0 (H) 12/25/2013   HGBA1C 10.0 (H) 09/15/2012   ESRSEDRATE 53 (H) 09/15/2012   REPTSTATUS  12/29/2015 FINAL 12/24/2015   CULT NO GROWTH 5 DAYS 12/24/2015    @LABSALLVALUES (HGBA1)@  There is no height or weight on file to calculate BMI.  Orders:  No orders of the defined types were placed in this encounter.  No orders of the defined types were placed in this encounter.    Procedures: No procedures performed  Clinical Data: No additional findings.  ROS:  All other systems negative, except as noted in the HPI. Review of Systems  Objective: Vital Signs: There were no vitals taken for this visit.  Specialty Comments:  No specialty comments available.  PMFS History: Patient Active Problem List   Diagnosis Date Noted  . Other spondylosis with radiculopathy, cervical region 01/27/2017  . Midfoot ulcer, left, limited to breakdown of skin ([place]) 11/15/2016  . Lateral epicondylitis, left elbow 08/12/2016  . Cellulitis of fifth toe of right foot 07/29/2016  . Prostate cancer ([place]) 06/19/2016  . Non-pressure chronic ulcer of other part of right foot limited to breakdown of skin ([place]) 04/04/2016  . Cellulitis of leg, right 12/24/2015  . Acute kidney injury superimposed on CKD ([place]) 12/24/2015  . Cellulitis of right lower extremity 12/24/2015  . CKD (chronic kidney disease), stage III ([place]) 12/25/2013  . Foot infection 12/24/2013  . Foot ulcer, left ([place]) 12/24/2013  . Cellulitis  09/14/2012  . Diabetic foot ulcer ([place]) 09/14/2012  . Gout 09/14/2012  . HTN (hypertension) 09/14/2012  . Hyperlipidemia 09/14/2012  . Hypothyroidism 09/14/2012  . CAD (coronary artery disease) 09/14/2012  . Diabetes mellitus ([place]) 09/14/2012  . Hypercholesteremia   . Neuropathy [facility])    Past Medical History:  Diagnosis Date  . Coronary artery disease   . Diabetes mellitus without complication ([place])   . Hypercholesteremia   . Hypertension   . Neuropathy   . Prostate cancer [facility])     Family History  Problem Relation Age of Onset  . Diabetes Mellitus II Mother   . Diabetes  Mellitus II Father   . CAD Father   . Cancer Father        prostate    Past Surgical History:  Procedure Laterality Date  . AMPUTATION Left 12/25/2013   Procedure: AMPUTATION RAY LEFT 5TH RAY;  Surgeon: [name], MD;  Location: [facility];  Service: Orthopedics;  Laterality: Left;  . BACK SURGERY    . CHOLECYSTECTOMY    . CORONARY ARTERY BYPASS GRAFT     Social History   Occupational History  . Not on file  Tobacco Use  . Smoking status: Former Smoker    Types: Cigars    Last attempt to quit: 12/24/1988    Years since quitting: 28.2  . Smokeless tobacco: Never Used  Substance and Sexual Activity  . Alcohol use: No  . Drug use: No  . Sexual activity: Yes    Partners: Female

## 2017-04-17 ENCOUNTER — Ambulatory Visit (INDEPENDENT_AMBULATORY_CARE_PROVIDER_SITE_OTHER): Payer: 59 | Admitting: Orthopedic Surgery

## 2017-04-24 ENCOUNTER — Ambulatory Visit (INDEPENDENT_AMBULATORY_CARE_PROVIDER_SITE_OTHER): Payer: 59 | Admitting: Orthopedic Surgery

## 2017-04-24 ENCOUNTER — Encounter (INDEPENDENT_AMBULATORY_CARE_PROVIDER_SITE_OTHER): Payer: Self-pay | Admitting: Orthopedic Surgery

## 2017-04-24 DIAGNOSIS — L97511 Non-pressure chronic ulcer of other part of right foot limited to breakdown of skin: Secondary | ICD-10-CM

## 2017-04-24 NOTE — Progress Notes (Signed)
Office Visit Note   Patient: [name]           Date of Birth: [birth year]           MRN: [id number] Visit Date: 04/24/2017              Requested by: Fanny Bien, [address] [place], [place] [address] PCP: Fanny Bien, MD  Chief Complaint  Patient presents with  . Right Foot - Wound Check      HPI: Patient is a 67 year old gentleman status post anterior cervical discectomy and fusion he states he still has pain in the left upper extremity he is wearing a soft collar.  Patient states that he has been on his feet a lot he does have custom orthotics.  Assessment & Plan: Visit Diagnoses:  1. Non-pressure chronic ulcer of other part of right foot limited to breakdown of skin ([place])     Plan: Discussed the importance of minimizing weightbearing discussed the possibility of using a kneeling scooter patient states that he does not feel comfortable with a kneeling scooter he has used one before.  Again reinforced that minimal weightbearing will help the ulcer to heal.  He has a good custom orthotic with felt relieving pad to unload pressure.  Follow-Up Instructions: Return in about 4 weeks (around 05/22/2017).   Ortho Exam  Patient is alert, oriented, no adenopathy, well-dressed, normal affect, normal respiratory effort. Examination patient has a good dorsalis pedis pulse.  He has atrophy of the fat pad of the calcaneus with essentially skin and bone with no fat pad.  He has a increasing ulcer over the base of the fifth metatarsal from weightbearing.  After informed consent a 10 blade knife was used to debride the skin and soft tissue back to healthy viable granulation tissue this was touched with silver nitrate.  The ulcer is 3 x 2 cm and 2 mm deep this does not probe to bone or tendon there is no drainage no abscess no cellulitis.  Imaging: No results found. No images are attached to the encounter.  Labs: Lab Results  Component Value Date   HGBA1C 6.0 (H)  12/24/2015   HGBA1C 7.0 (H) 12/25/2013   HGBA1C 10.0 (H) 09/15/2012   ESRSEDRATE 53 (H) 09/15/2012   REPTSTATUS 12/29/2015 FINAL 12/24/2015   CULT NO GROWTH 5 DAYS 12/24/2015    @LABSALLVALUES (HGBA1)@  There is no height or weight on file to calculate BMI.  Orders:  No orders of the defined types were placed in this encounter.  No orders of the defined types were placed in this encounter.    Procedures: No procedures performed  Clinical Data: No additional findings.  ROS:  All other systems negative, except as noted in the HPI. Review of Systems  Objective: Vital Signs: There were no vitals taken for this visit.  Specialty Comments:  No specialty comments available.  PMFS History: Patient Active Problem List   Diagnosis Date Noted  . Other spondylosis with radiculopathy, cervical region 01/27/2017  . Midfoot ulcer, left, limited to breakdown of skin ([place]) 11/15/2016  . Lateral epicondylitis, left elbow 08/12/2016  . Cellulitis of fifth toe of right foot 07/29/2016  . Prostate cancer ([place]) 06/19/2016  . Non-pressure chronic ulcer of other part of right foot limited to breakdown of skin ([place]) 04/04/2016  . Cellulitis of leg, right 12/24/2015  . Acute kidney injury superimposed on CKD ([place]) 12/24/2015  . Cellulitis of right lower extremity 12/24/2015  .  CKD (chronic kidney disease), stage III ([place]) 12/25/2013  . Foot infection 12/24/2013  . Foot ulcer, left ([place]) 12/24/2013  . Cellulitis 09/14/2012  . Diabetic foot ulcer ([place]) 09/14/2012  . Gout 09/14/2012  . HTN (hypertension) 09/14/2012  . Hyperlipidemia 09/14/2012  . Hypothyroidism 09/14/2012  . CAD (coronary artery disease) 09/14/2012  . Diabetes mellitus ([place]) 09/14/2012  . Hypercholesteremia   . Neuropathy [facility])    Past Medical History:  Diagnosis Date  . Coronary artery disease   . Diabetes mellitus without complication ([place])   . Hypercholesteremia   . Hypertension   . Neuropathy   . Prostate  cancer [facility])     Family History  Problem Relation Age of Onset  . Diabetes Mellitus II Mother   . Diabetes Mellitus II Father   . CAD Father   . Cancer Father        prostate    Past Surgical History:  Procedure Laterality Date  . AMPUTATION Left 12/25/2013   Procedure: AMPUTATION RAY LEFT 5TH RAY;  Surgeon: [name], MD;  Location: [facility];  Service: Orthopedics;  Laterality: Left;  . BACK SURGERY    . CHOLECYSTECTOMY    . CORONARY ARTERY BYPASS GRAFT     Social History   Occupational History  . Not on file  Tobacco Use  . Smoking status: Former Smoker    Types: Cigars    Last attempt to quit: 12/24/1988    Years since quitting: 28.3  . Smokeless tobacco: Never Used  Substance and Sexual Activity  . Alcohol use: No  . Drug use: No  . Sexual activity: Yes    Partners: Female

## 2017-05-26 ENCOUNTER — Encounter (INDEPENDENT_AMBULATORY_CARE_PROVIDER_SITE_OTHER): Payer: Self-pay | Admitting: Orthopedic Surgery

## 2017-05-26 ENCOUNTER — Ambulatory Visit (INDEPENDENT_AMBULATORY_CARE_PROVIDER_SITE_OTHER): Payer: 59 | Admitting: Orthopedic Surgery

## 2017-05-26 VITALS — Ht 70.0 in | Wt 236.0 lb

## 2017-05-26 DIAGNOSIS — L97511 Non-pressure chronic ulcer of other part of right foot limited to breakdown of skin: Secondary | ICD-10-CM

## 2017-05-26 NOTE — Progress Notes (Signed)
Office Visit Note   Patient: [name]           Date of Birth: 17-Feb-1951           MRN: [id number] Visit Date: 05/26/2017              Requested by: Fanny Bien, [address] [place], [place] [address] PCP: Fanny Bien, MD  Chief Complaint  Patient presents with  . Right Foot - Follow-up    Foot ulcer      HPI: Patient is a 67 year old gentleman with a persistent ulcer base of the fifth metatarsal right foot.  Patient has custom orthotics felt relieving pressure relieving pads but still has persistent ulceration.  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  Assessment & Plan: Visit Diagnoses:  1. Non-pressure chronic ulcer of other part of right foot limited to breakdown of skin ([place])     Plan: Discussed the importance of minimizing weightbearing recommended using a kneeling scooter.  Patient states that this would be inconvenient for him to use a kneeling scooter at work and while trying to go to soccer matches.  He states he will try to get his weight off the foot continue with antibiotic ointment dressing changes.  Follow-Up Instructions: Return in about 3 weeks (around 06/16/2017).   Ortho Exam  Patient is alert, oriented, no adenopathy, well-dressed, normal affect, normal respiratory effort. Examination patient has an antalgic gait he has a palpable pulse.  There is no surrounding cellulitis around the ulcer there is nonviable tissue around the wound edges.  After informed consent a 10 blade knife was used to debride the skin and soft tissue back to bleeding viable granulation tissue.  This was touched with silver nitrate the ulcer is 10 mm in diameter 1 mm deep there is no drainage no exposed bone or tendon no signs of any deep infection.  Patient has shown improvement in his hemoglobin A1c.  Imaging: No results found. No images are attached to the encounter.  Labs: Lab Results    Component Value Date   HGBA1C 6.0 (H) 12/24/2015   HGBA1C 7.0 (H) 12/25/2013   HGBA1C 10.0 (H) 09/15/2012   ESRSEDRATE 53 (H) 09/15/2012   REPTSTATUS 12/29/2015 FINAL 12/24/2015   CULT NO GROWTH 5 DAYS 12/24/2015    @LABSALLVALUES (HGBA1)@  Body mass index is 33.86 kg/m.  Orders:  No orders of the defined types were placed in this encounter.  No orders of the defined types were placed in this encounter.    Procedures: No procedures performed  Clinical Data: No additional findings.  ROS:  All other systems negative, except as noted in the HPI. Review of Systems  Objective: Vital Signs: Ht 5\' 10"  (1.778 m)   Wt 236 lb (107 kg)   BMI 33.86 kg/m   Specialty Comments:  No specialty comments available.  PMFS History: Patient Active Problem List   Diagnosis Date Noted  . Other spondylosis with radiculopathy, cervical region 01/27/2017  . Midfoot ulcer, left, limited to breakdown of skin ([place]) 11/15/2016  . Lateral epicondylitis, left elbow 08/12/2016  . Cellulitis of fifth toe of right foot 07/29/2016  . Prostate cancer ([place]) 06/19/2016  . Non-pressure chronic ulcer of other part of right foot limited to breakdown of skin ([place]) 04/04/2016  . Cellulitis of leg, right 12/24/2015  . Acute kidney injury superimposed on CKD ([place]) 12/24/2015  . Cellulitis of  right lower extremity 12/24/2015  . CKD (chronic kidney disease), stage III ([place]) 12/25/2013  . Foot infection 12/24/2013  . Foot ulcer, left ([place]) 12/24/2013  . Cellulitis 09/14/2012  . Diabetic foot ulcer ([place]) 09/14/2012  . Gout 09/14/2012  . HTN (hypertension) 09/14/2012  . Hyperlipidemia 09/14/2012  . Hypothyroidism 09/14/2012  . CAD (coronary artery disease) 09/14/2012  . Diabetes mellitus ([place]) 09/14/2012  . Hypercholesteremia   . Neuropathy [facility])    Past Medical History:  Diagnosis Date  . Coronary artery disease   . Diabetes mellitus without complication ([place])   . Hypercholesteremia   .  Hypertension   . Neuropathy   . Prostate cancer [facility])     Family History  Problem Relation Age of Onset  . Diabetes Mellitus II Mother   . Diabetes Mellitus II Father   . CAD Father   . Cancer Father        prostate    Past Surgical History:  Procedure Laterality Date  . AMPUTATION Left 12/25/2013   Procedure: AMPUTATION RAY LEFT 5TH RAY;  Surgeon: [name], MD;  Location[facility];  Service: Orthopedics;  Laterality: Left;  . BACK SURGERY    . CHOLECYSTECTOMY    . CORONARY ARTERY BYPASS GRAFT     Social History   Occupational History  . Not on file  Tobacco Use  . Smoking status: Former Smoker    Types: Cigars    Last attempt to quit: 12/24/1988    Years since quitting: 28.4  . Smokeless tobacco: Never Used  Substance and Sexual Activity  . Alcohol use: No  . Drug use: No  . Sexual activity: Yes    Partners: Female

## 2017-06-10 ENCOUNTER — Other Ambulatory Visit (HOSPITAL_COMMUNITY): Payer: Self-pay | Admitting: Interventional Radiology

## 2017-06-10 ENCOUNTER — Telehealth: Payer: Self-pay | Admitting: Radiology

## 2017-06-10 NOTE — Telephone Encounter (Signed)
Phoned patient to schedule 6 mo follow up app't w/ Dr Barbie Banner.  Patient states that he is doing well and does not want to schedule follow up app't at this time.  Stephene Alegria Riki Rusk, RN 06/10/2017 1:45 PM

## 2017-06-16 ENCOUNTER — Ambulatory Visit (INDEPENDENT_AMBULATORY_CARE_PROVIDER_SITE_OTHER): Payer: 59 | Admitting: Orthopedic Surgery

## 2017-06-19 ENCOUNTER — Ambulatory Visit (INDEPENDENT_AMBULATORY_CARE_PROVIDER_SITE_OTHER): Payer: 59 | Admitting: Orthopedic Surgery

## 2017-06-19 VITALS — Ht 70.0 in | Wt 236.0 lb

## 2017-06-19 DIAGNOSIS — L97511 Non-pressure chronic ulcer of other part of right foot limited to breakdown of skin: Secondary | ICD-10-CM

## 2017-06-23 ENCOUNTER — Encounter (INDEPENDENT_AMBULATORY_CARE_PROVIDER_SITE_OTHER): Payer: Self-pay | Admitting: Orthopedic Surgery

## 2017-06-23 NOTE — Progress Notes (Signed)
Office Visit Note   Patient: [name]           Date of Birth: 1950/07/23           MRN: [id number] Visit Date: 06/19/2017              Requested by: Fanny Bien, [address] [place], [place] [address] PCP: Fanny Bien, MD  Chief Complaint  Patient presents with  . Right Foot - Wound Check, Follow-up      HPI: Patient is a 24-year gentleman presents in follow-up for ulcer base of the fifth metatarsal right foot.  Patient has good orthotics pressure relieving felt pads but still is ambulating on the lateral aspect of his foot.  Assessment & Plan: Visit Diagnoses:  1. Non-pressure chronic ulcer of other part of right foot limited to breakdown of skin ([name])     Plan: The orthotic was modified to allow for plantarflexion of the first ray.  Patient will follow-up in 3 weeks.  If he is better but not completely improved we will need to post laterally to further unload the pressure.  Follow-Up Instructions: No Follow-up on file.   Ortho Exam  Patient is alert, oriented, no adenopathy, well-dressed, normal affect, normal respiratory effort. Examination patient has a palpable pulse he has a plantar flexed first ray that is rocking his foot over 4 weightbearing on the lateral side of his foot.  He has good midfoot motion and the varus alignment of the hindfoot can be corrected with allowing for plantarflexion of the first ray.  The ulcer has good granulation tissue it is 10 mm in diameter 1 mm deep.  Imaging: No results found. No images are attached to the encounter.  Labs: Lab Results  Component Value Date   HGBA1C 6.0 (H) 12/24/2015   HGBA1C 7.0 (H) 12/25/2013   HGBA1C 10.0 (H) 09/15/2012   ESRSEDRATE 53 (H) 09/15/2012   REPTSTATUS 12/29/2015 FINAL 12/24/2015   CULT NO GROWTH 5 DAYS 12/24/2015    @LABSALLVALUES (HGBA1)@  Body mass index is 33.86 kg/m.  Orders:  No orders of the defined types were placed in this encounter.  No orders of the  defined types were placed in this encounter.    Procedures: No procedures performed  Clinical Data: No additional findings.  ROS:  All other systems negative, except as noted in the HPI. Review of Systems  Objective: Vital Signs: Ht 5\' 10"  (1.778 m)   Wt 236 lb (107 kg)   BMI 33.86 kg/m   Specialty Comments:  No specialty comments available.  PMFS History: Patient Active Problem List   Diagnosis Date Noted  . Other spondylosis with radiculopathy, cervical region 01/27/2017  . Midfoot ulcer, left, limited to breakdown of skin ([name]) 11/15/2016  . Lateral epicondylitis, left elbow 08/12/2016  . Cellulitis of fifth toe of right foot 07/29/2016  . Prostate cancer ([place]) 06/19/2016  . Non-pressure chronic ulcer of other part of right foot limited to breakdown of skin ([name]) 04/04/2016  . Cellulitis of leg, right 12/24/2015  . Acute kidney injury superimposed on CKD ([name]) 12/24/2015  . Cellulitis of right lower extremity 12/24/2015  . CKD (chronic kidney disease), stage III ([place]) 12/25/2013  . Foot infection 12/24/2013  . Foot ulcer, left ([name]) 12/24/2013  . Cellulitis 09/14/2012  . Diabetic foot ulcer ([name]) 09/14/2012  . Gout 09/14/2012  . HTN (hypertension) 09/14/2012  . Hyperlipidemia 09/14/2012  . Hypothyroidism 09/14/2012  . CAD (coronary artery disease) 09/14/2012  .  Diabetes mellitus ([name]) 09/14/2012  . Hypercholesteremia   . Neuropathy [facility])    Past Medical History:  Diagnosis Date  . Coronary artery disease   . Diabetes mellitus without complication ([place])   . Hypercholesteremia   . Hypertension   . Neuropathy   . Prostate cancer [facility])     Family History  Problem Relation Age of Onset  . Diabetes Mellitus II Mother   . Diabetes Mellitus II Father   . CAD Father   . Cancer Father        prostate    Past Surgical History:  Procedure Laterality Date  . AMPUTATION Left 12/25/2013   Procedure: AMPUTATION RAY LEFT 5TH RAY;  Surgeon: [name], MD;   Location[facility];  Service: Orthopedics;  Laterality: Left;  . BACK SURGERY    . CHOLECYSTECTOMY    . CORONARY ARTERY BYPASS GRAFT     Social History   Occupational History  . Not on file  Tobacco Use  . Smoking status: Former Smoker    Types: Cigars    Last attempt to quit: 12/24/1988    Years since quitting: 28.5  . Smokeless tobacco: Never Used  Substance and Sexual Activity  . Alcohol use: No  . Drug use: No  . Sexual activity: Yes    Partners: Female

## 2017-07-17 ENCOUNTER — Encounter (INDEPENDENT_AMBULATORY_CARE_PROVIDER_SITE_OTHER): Payer: Self-pay | Admitting: Orthopedic Surgery

## 2017-07-17 ENCOUNTER — Ambulatory Visit (INDEPENDENT_AMBULATORY_CARE_PROVIDER_SITE_OTHER): Payer: 59 | Admitting: Orthopedic Surgery

## 2017-07-17 DIAGNOSIS — L97511 Non-pressure chronic ulcer of other part of right foot limited to breakdown of skin: Secondary | ICD-10-CM

## 2017-07-17 MED ORDER — DOXYCYCLINE HYCLATE 100 MG PO TABS: 100.0000 mg | ORAL_TABLET | Freq: Two times a day (BID) | ORAL | 0 refills | Status: DC

## 2017-07-17 NOTE — Progress Notes (Signed)
Office Visit Note   Patient: [name]           Date of Birth: 01-Aug-1950           MRN: [id number] Visit Date: 07/17/2017              Requested by: Fanny Bien, [address] [place], [place] [address] PCP: Fanny Bien, MD  Chief Complaint  Patient presents with  . Right Foot - Follow-up, Pain      HPI: Patient presents in follow-up for chronic Waggoner grade 1 ulcer beneath the base of the fifth metatarsal right foot.  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  Assessment & Plan: Visit Diagnoses:  1. Non-pressure chronic ulcer of other part of right foot limited to breakdown of skin ([place])     Plan: Patient was given instructions to try a kneeling scooter or some way to get pressure off his foot continue with the dressing changes daily we will call in a prescription for doxycycline he does have some redness around the ulcer at this time the redness is not tender to palpation but I am concerned that he may be developing some cellulitis.  Follow-Up Instructions: No follow-ups on file.   Ortho Exam  Patient is alert, oriented, no adenopathy, well-dressed, normal affect, normal respiratory effort. Examination there is some redness around the ulcer.  After informed consent a 10 blade knife was used to debride the skin and soft tissue back to healthy bleeding viable granulation tissue the ulcer is 2 cm in diameter 2 mm deep there is no exposed bone or tendon.  Patient has a good pulse.  Imaging: No results found. No images are attached to the encounter.  Labs: Lab Results  Component Value Date   HGBA1C 6.0 (H) 12/24/2015   HGBA1C 7.0 (H) 12/25/2013   HGBA1C 10.0 (H) 09/15/2012   ESRSEDRATE 53 (H) 09/15/2012   REPTSTATUS 12/29/2015 FINAL 12/24/2015   CULT NO  GROWTH 5 DAYS 12/24/2015    @LABSALLVALUES (HGBA1)@  There is no height or weight on file to calculate BMI.  Orders:  No orders of the defined types were placed in this encounter.  Meds ordered this encounter  Medications  . doxycycline (VIBRA-TABS) 100 MG tablet    Sig: Take 1 tablet (100 mg total) by mouth 2 (two) times daily.    Dispense:  60 tablet    Refill:  0     Procedures: No procedures performed  Clinical Data: No additional findings.  ROS:  All other systems negative, except as noted in the HPI. Review of Systems  Objective: Vital Signs: There were no vitals taken for this visit.  Specialty Comments:  No specialty comments available.  PMFS History: Patient Active Problem List   Diagnosis Date Noted  . Other spondylosis with radiculopathy, cervical region 01/27/2017  . Midfoot ulcer, left, limited to breakdown of skin ([place]) 11/15/2016  . Lateral epicondylitis, left elbow 08/12/2016  . Cellulitis of fifth toe of right foot 07/29/2016  . Prostate cancer ([place]) 06/19/2016  . Non-pressure chronic ulcer of other part of right foot limited to breakdown of skin ([place]) 04/04/2016  . Cellulitis of leg, right 12/24/2015  . Acute kidney injury superimposed on CKD ([place]) 12/24/2015  . Cellulitis  of right lower extremity 12/24/2015  . CKD (chronic kidney disease), stage III ([place]) 12/25/2013  . Foot infection 12/24/2013  . Foot ulcer, left ([place]) 12/24/2013  . Cellulitis 09/14/2012  . Diabetic foot ulcer ([place]) 09/14/2012  . Gout 09/14/2012  . HTN (hypertension) 09/14/2012  . Hyperlipidemia 09/14/2012  . Hypothyroidism 09/14/2012  . CAD (coronary artery disease) 09/14/2012  . Diabetes mellitus ([place]) 09/14/2012  . Hypercholesteremia   . Neuropathy [facility])    Past Medical History:  Diagnosis Date  . Coronary artery disease   . Diabetes mellitus without complication ([place])   . Hypercholesteremia   . Hypertension   . Neuropathy   . Prostate cancer [facility])     Family  History  Problem Relation Age of Onset  . Diabetes Mellitus II Mother   . Diabetes Mellitus II Father   . CAD Father   . Cancer Father        prostate    Past Surgical History:  Procedure Laterality Date  . AMPUTATION Left 12/25/2013   Procedure: AMPUTATION RAY LEFT 5TH RAY;  Surgeon: [name], MD;  Location: [facility];  Service: Orthopedics;  Laterality: Left;  . BACK SURGERY    . CHOLECYSTECTOMY    . CORONARY ARTERY BYPASS GRAFT     Social History   Occupational History  . Not on file  Tobacco Use  . Smoking status: Former Smoker    Types: Cigars    Last attempt to quit: 12/24/1988    Years since quitting: 28.5  . Smokeless tobacco: Never Used  Substance and Sexual Activity  . Alcohol use: No  . Drug use: No  . Sexual activity: Yes    Partners: Female

## 2017-08-07 ENCOUNTER — Encounter (INDEPENDENT_AMBULATORY_CARE_PROVIDER_SITE_OTHER): Payer: Self-pay | Admitting: Orthopedic Surgery

## 2017-08-07 ENCOUNTER — Ambulatory Visit (INDEPENDENT_AMBULATORY_CARE_PROVIDER_SITE_OTHER): Payer: 59 | Admitting: Orthopedic Surgery

## 2017-08-07 VITALS — Ht 70.0 in | Wt 236.0 lb

## 2017-08-07 DIAGNOSIS — L97511 Non-pressure chronic ulcer of other part of right foot limited to breakdown of skin: Secondary | ICD-10-CM | POA: Diagnosis not present

## 2017-08-07 NOTE — Progress Notes (Signed)
Office Visit Note   Patient: [name]           Date of Birth: Dec 25, 1950           MRN: [id number] Visit Date: 08/07/2017              Requested by: Fanny Bien, [address] [place], [place] [address] PCP: Fanny Bien, MD  Chief Complaint  Patient presents with  . Right Foot - Follow-up, Wound Check    Ulcer base 5th MT      HPI: Patient is a 67 year old gentleman presents in follow-up for Wagoner grade 1 ulcer on the base of the fifth metatarsal right foot.  Patient has been using topical antibiotic ointment and oral doxycycline.  Patient states he has not been using his kneeling scooter he states it is too difficult to use.  Assessment & Plan: Visit Diagnoses:  1. Non-pressure chronic ulcer of other part of right foot limited to breakdown of skin ([place])     Plan: Discontinue the oral antibiotics and the topical antibiotics there is too much maceration.  Ulcer was debrided.  The orthotic was further modified to unload pressure from the base of the fifth metatarsal patient has shown improvement from his last exam.  Follow-Up Instructions: Return in about 1 month (around 09/04/2017).   Ortho Exam  Patient is alert, oriented, no adenopathy, well-dressed, normal affect, normal respiratory effort. Examination there is maceration around there is dermatitis from the [place].  There is no cellulitis no tenderness to palpation no signs of infection.  After informed consent a 10 blade knife was used to debride the skin and soft tissue back to healthy viable granulation tissue there is no exposed bone or tendon there is 100% granulation tissue the wound bed the macerated tissue was debrided the ulcer is 10 mm in diameter and 3 mm deep.  This was touched with silver nitrate.  A 2 x 2 plus Covan was applied.  Patient has good pressure relieving underneath the first ray to allow for plantarflexion of the first ray there is a felt relieving pad under his orthotic and the  orthotic was further trimmed to unload pressure from the base of the fifth metatarsal.  Imaging: No results found. No images are attached to the encounter.  Labs: Lab Results  Component Value Date   HGBA1C 6.0 (H) 12/24/2015   HGBA1C 7.0 (H) 12/25/2013   HGBA1C 10.0 (H) 09/15/2012   ESRSEDRATE 53 (H) 09/15/2012   REPTSTATUS 12/29/2015 FINAL 12/24/2015   CULT NO GROWTH 5 DAYS 12/24/2015    Lab Results  Component Value Date/Time   HGBA1C 6.0 (H) 12/24/2015 09:31 PM   HGBA1C 7.0 (H) 12/25/2013 05:25 AM   HGBA1C 10.0 (H) 09/15/2012 05:20 AM    Body mass index is 33.86 kg/m.  Orders:  No orders of the defined types were placed in this encounter.  No orders of the defined types were placed in this encounter.    Procedures: No procedures performed  Clinical Data: No additional findings.  ROS:  All other systems negative, except as noted in the HPI. Review of Systems  Objective: Vital Signs: Ht 5\' 10"  (1.778 m)   Wt 236 lb (107 kg)   BMI 33.86 kg/m   Specialty Comments:  No specialty comments available.  PMFS History: Patient Active Problem List   Diagnosis Date Noted  . Other spondylosis with radiculopathy, cervical region 01/27/2017  . Midfoot ulcer, left, limited to breakdown of  skin ([place]) 11/15/2016  . Lateral epicondylitis, left elbow 08/12/2016  . Cellulitis of fifth toe of right foot 07/29/2016  . Prostate cancer ([place]) 06/19/2016  . Non-pressure chronic ulcer of other part of right foot limited to breakdown of skin ([place]) 04/04/2016  . Cellulitis of leg, right 12/24/2015  . Acute kidney injury superimposed on CKD ([place]) 12/24/2015  . Cellulitis of right lower extremity 12/24/2015  . CKD (chronic kidney disease), stage III ([place]) 12/25/2013  . Foot infection 12/24/2013  . Foot ulcer, left ([place]) 12/24/2013  . Cellulitis 09/14/2012  . Diabetic foot ulcer ([place]) 09/14/2012  . Gout 09/14/2012  . HTN (hypertension) 09/14/2012  . Hyperlipidemia 09/14/2012    . Hypothyroidism 09/14/2012  . CAD (coronary artery disease) 09/14/2012  . Diabetes mellitus ([place]) 09/14/2012  . Hypercholesteremia   . Neuropathy [facility])    Past Medical History:  Diagnosis Date  . Coronary artery disease   . Diabetes mellitus without complication ([place])   . Hypercholesteremia   . Hypertension   . Neuropathy   . Prostate cancer [facility])     Family History  Problem Relation Age of Onset  . Diabetes Mellitus II Mother   . Diabetes Mellitus II Father   . CAD Father   . Cancer Father        prostate    Past Surgical History:  Procedure Laterality Date  . AMPUTATION Left 12/25/2013   Procedure: AMPUTATION RAY LEFT 5TH RAY;  Surgeon: [name], MD;  Location: [facility];  Service: Orthopedics;  Laterality: Left;  . BACK SURGERY    . CHOLECYSTECTOMY    . CORONARY ARTERY BYPASS GRAFT     Social History   Occupational History  . Not on file  Tobacco Use  . Smoking status: Former Smoker    Types: Cigars    Last attempt to quit: 12/24/1988    Years since quitting: 28.6  . Smokeless tobacco: Never Used  Substance and Sexual Activity  . Alcohol use: No  . Drug use: No  . Sexual activity: Yes    Partners: Female

## 2017-08-14 ENCOUNTER — Other Ambulatory Visit (INDEPENDENT_AMBULATORY_CARE_PROVIDER_SITE_OTHER): Payer: Self-pay | Admitting: Orthopedic Surgery

## 2017-09-04 ENCOUNTER — Other Ambulatory Visit (INDEPENDENT_AMBULATORY_CARE_PROVIDER_SITE_OTHER): Payer: Self-pay | Admitting: Orthopedic Surgery

## 2017-09-04 ENCOUNTER — Ambulatory Visit (INDEPENDENT_AMBULATORY_CARE_PROVIDER_SITE_OTHER): Payer: 59 | Admitting: Orthopedic Surgery

## 2017-09-04 ENCOUNTER — Encounter (INDEPENDENT_AMBULATORY_CARE_PROVIDER_SITE_OTHER): Payer: Self-pay | Admitting: Orthopedic Surgery

## 2017-09-04 VITALS — Ht 70.0 in | Wt 236.0 lb

## 2017-09-04 DIAGNOSIS — M7661 Achilles tendinitis, right leg: Secondary | ICD-10-CM | POA: Diagnosis not present

## 2017-09-04 DIAGNOSIS — L97511 Non-pressure chronic ulcer of other part of right foot limited to breakdown of skin: Secondary | ICD-10-CM | POA: Diagnosis not present

## 2017-09-04 NOTE — Progress Notes (Signed)
Office Visit Note   Patient: [name]           Date of Birth: 01-21-1951           MRN: [id number] Visit Date: 09/04/2017              Requested by: Fanny Bien, [address] [place], [place] [address] PCP: Fanny Bien, MD  Chief Complaint  Patient presents with  . Right Foot - Follow-up    Ulcer base 5th MT      HPI: Patient is a 67 year old gentleman with diabetic insensate neuropathy presents complaining of plantar fasciitis secondary to his heel cord contracture and a ulcer over the base of the fifth metatarsal.  Patient has been using a Band-Aid but has not been putting ointment on it.  He is full weightbearing in shoes and orthotics.  Assessment & Plan: Visit Diagnoses:  1. Non-pressure chronic ulcer of other part of right foot limited to breakdown of skin ([place])   2. Achilles tendinitis, right leg     Plan: Patient was given instruction and demonstrated heel cord stretching to stretch the Achilles to relieve the plantar fasciitis he will do this 5 times a day a minute at a time continue with his orthotics recommended using antibiotic ointment with Band-Aid change daily.  Follow-Up Instructions: Return in about 1 month (around 10/02/2017).   Ortho Exam  Patient is alert, oriented, no adenopathy, well-dressed, normal affect, normal respiratory effort. Examination the wound is much smaller patient does have some green discoloration in the hypertrophic callus around the wound.  After informed consent the skin and soft tissue was debrided back to bleeding viable granulation tissue all the green tissue was excised.  The ulcer was 5 x 10 mm and 1 mm deep after debridement.  Iodosorb and a Band-Aid was applied.  There is no cellulitis there is some skin irritation from his Band-Aid.  Imaging: No results found. No images are attached to the encounter.  Labs: Lab Results  Component Value Date   HGBA1C 6.0 (H) 12/24/2015   HGBA1C 7.0 (H) 12/25/2013   HGBA1C 10.0 (H) 09/15/2012   ESRSEDRATE 53 (H) 09/15/2012   REPTSTATUS 12/29/2015 FINAL 12/24/2015   CULT NO GROWTH 5 DAYS 12/24/2015     Lab Results  Component Value Date   ALBUMIN 3.7 12/24/2015   ALBUMIN 3.7 12/28/2014   ALBUMIN 3.2 (L) 09/15/2012    Body mass index is 33.86 kg/m.  Orders:  No orders of the defined types were placed in this encounter.  No orders of the defined types were placed in this encounter.    Procedures: No procedures performed  Clinical Data: No additional findings.  ROS:  All other systems negative, except as noted in the HPI. Review of Systems  Objective: Vital Signs: Ht 5\' 10"  (1.778 m)   Wt 236 lb (107 kg)   BMI 33.86 kg/m   Specialty Comments:  No specialty comments available.  PMFS History: Patient Active Problem List   Diagnosis Date Noted  . Other spondylosis with radiculopathy, cervical region 01/27/2017  . Midfoot ulcer, left, limited to breakdown of skin ([place]) 11/15/2016  . Lateral epicondylitis, left elbow 08/12/2016  . Cellulitis of fifth toe of right foot 07/29/2016  . Prostate cancer ([place]) 06/19/2016  . Non-pressure chronic ulcer of other part of right foot limited to breakdown of skin ([place]) 04/04/2016  . Cellulitis of leg, right 12/24/2015  . Acute kidney injury superimposed on CKD ([place])  12/24/2015  . Cellulitis of right lower extremity 12/24/2015  . CKD (chronic kidney disease), stage III ([place]) 12/25/2013  . Foot infection 12/24/2013  . Foot ulcer, left ([place]) 12/24/2013  . Cellulitis 09/14/2012  . Diabetic foot ulcer ([place]) 09/14/2012  . Gout 09/14/2012  . HTN (hypertension) 09/14/2012  . Hyperlipidemia 09/14/2012  . Hypothyroidism 09/14/2012  . CAD (coronary artery disease) 09/14/2012  . Diabetes mellitus ([place]) 09/14/2012  . Hypercholesteremia   . Neuropathy [facility])    Past Medical History:  Diagnosis Date  . Coronary artery disease   . Diabetes mellitus without complication ([place])   .  Hypercholesteremia   . Hypertension   . Neuropathy   . Prostate cancer [facility])     Family History  Problem Relation Age of Onset  . Diabetes Mellitus II Mother   . Diabetes Mellitus II Father   . CAD Father   . Cancer Father        prostate    Past Surgical History:  Procedure Laterality Date  . AMPUTATION Left 12/25/2013   Procedure: AMPUTATION RAY LEFT 5TH RAY;  Surgeon: [name], MD;  Location: [facility];  Service: Orthopedics;  Laterality: Left;  . BACK SURGERY    . CHOLECYSTECTOMY    . CORONARY ARTERY BYPASS GRAFT     Social History   Occupational History  . Not on file  Tobacco Use  . Smoking status: Former Smoker    Types: Cigars    Last attempt to quit: 12/24/1988    Years since quitting: 28.7  . Smokeless tobacco: Never Used  Substance and Sexual Activity  . Alcohol use: No  . Drug use: No  . Sexual activity: Yes    Partners: Female

## 2017-09-05 NOTE — Telephone Encounter (Signed)
Rx request, please advise. °

## 2017-10-02 ENCOUNTER — Ambulatory Visit (INDEPENDENT_AMBULATORY_CARE_PROVIDER_SITE_OTHER): Payer: Managed Care, Other (non HMO) | Admitting: Orthopedic Surgery

## 2017-10-02 ENCOUNTER — Encounter (INDEPENDENT_AMBULATORY_CARE_PROVIDER_SITE_OTHER): Payer: Self-pay | Admitting: Orthopedic Surgery

## 2017-10-02 VITALS — Ht 70.0 in | Wt 236.0 lb

## 2017-10-02 DIAGNOSIS — L97511 Non-pressure chronic ulcer of other part of right foot limited to breakdown of skin: Secondary | ICD-10-CM | POA: Diagnosis not present

## 2017-10-02 DIAGNOSIS — M7661 Achilles tendinitis, right leg: Secondary | ICD-10-CM | POA: Diagnosis not present

## 2017-10-02 NOTE — Progress Notes (Signed)
Office Visit Note   Patient: [name]           Date of Birth: [birth year]           MRN: [id number] Visit Date: 10/02/2017              Requested by: Fanny Bien, [address] [place], [place] [address] PCP: Fanny Bien, MD  Chief Complaint  Patient presents with  . Right Foot - Wound Check    Ulcer base 5th MT      HPI: Patient is a 67 year old gentleman who presents follow-up for a ulcer beneath the fifth metatarsal right foot he has been doing heel cord stretching with minimal relief he complains of heel pain.  Assessment & Plan: Visit Diagnoses:  1. Non-pressure chronic ulcer of other part of right foot limited to breakdown of skin ([place])   2. Achilles tendinitis, right leg     Plan: Recommended orthotics with a tight heel cup to provide more support to his heel pad which is atrophic.  Continue with wound care for the ulcer orthotics were recommended.  Follow-Up Instructions: Return in about 3 weeks (around 10/23/2017).   Ortho Exam  Patient is alert, oriented, no adenopathy, well-dressed, normal affect, normal respiratory effort. Examination patient has a thin atrophic heel pad on the right essentially skin and bone is tender to palpation the origin the plantar fascia he does have heel cord tightness with dorsiflexion to neutral.  The ulcer over the fifth metatarsal is getting better.  After informed consent a 10 blade knife was used to debride the skin and soft tissue back to healthy viable granulation tissue this was touched with silver nitrate.  The ulcer is 10 mm in diameter 2 mm deep.  This is showing good steady improvement.  Imaging: No results found. No images are attached to the encounter.  Labs: Lab Results  Component Value Date   HGBA1C 6.0 (H) 12/24/2015   HGBA1C 7.0 (H) 12/25/2013   HGBA1C 10.0 (H) 09/15/2012   ESRSEDRATE 53 (H) 09/15/2012   REPTSTATUS 12/29/2015 FINAL 12/24/2015   CULT NO GROWTH 5 DAYS 12/24/2015     Lab  Results  Component Value Date   ALBUMIN 3.7 12/24/2015   ALBUMIN 3.7 12/28/2014   ALBUMIN 3.2 (L) 09/15/2012    Body mass index is 33.86 kg/m.  Orders:  No orders of the defined types were placed in this encounter.  No orders of the defined types were placed in this encounter.    Procedures: No procedures performed  Clinical Data: No additional findings.  ROS:  All other systems negative, except as noted in the HPI. Review of Systems  Objective: Vital Signs: Ht 5\' 10"  (1.778 m)   Wt 236 lb (107 kg)   BMI 33.86 kg/m   Specialty Comments:  No specialty comments available.  PMFS History: Patient Active Problem List   Diagnosis Date Noted  . Other spondylosis with radiculopathy, cervical region 01/27/2017  . Midfoot ulcer, left, limited to breakdown of skin ([place]) 11/15/2016  . Lateral epicondylitis, left elbow 08/12/2016  . Cellulitis of fifth toe of right foot 07/29/2016  . Prostate cancer ([place]) 06/19/2016  . Non-pressure chronic ulcer of other part of right foot limited to breakdown of skin ([place]) 04/04/2016  . Cellulitis of leg, right 12/24/2015  . Acute kidney injury superimposed on CKD ([place]) 12/24/2015  . Cellulitis of right lower extremity 12/24/2015  . CKD (chronic kidney disease), stage III ([place]) 12/25/2013  .  Foot infection 12/24/2013  . Foot ulcer, left ([place]) 12/24/2013  . Cellulitis 09/14/2012  . Diabetic foot ulcer ([place]) 09/14/2012  . Gout 09/14/2012  . HTN (hypertension) 09/14/2012  . Hyperlipidemia 09/14/2012  . Hypothyroidism 09/14/2012  . CAD (coronary artery disease) 09/14/2012  . Diabetes mellitus ([place]) 09/14/2012  . Hypercholesteremia   . Neuropathy [facility])    Past Medical History:  Diagnosis Date  . Coronary artery disease   . Diabetes mellitus without complication ([place])   . Hypercholesteremia   . Hypertension   . Neuropathy   . Prostate cancer [facility])     Family History  Problem Relation Age of Onset  . Diabetes Mellitus II Mother     . Diabetes Mellitus II Father   . CAD Father   . Cancer Father        prostate    Past Surgical History:  Procedure Laterality Date  . AMPUTATION Left 12/25/2013   Procedure: AMPUTATION RAY LEFT 5TH RAY;  Surgeon: [name], MD;  Location: [facility];  Service: Orthopedics;  Laterality: Left;  . BACK SURGERY    . CHOLECYSTECTOMY    . CORONARY ARTERY BYPASS GRAFT     Social History   Occupational History  . Not on file  Tobacco Use  . Smoking status: Former Smoker    Types: Cigars    Last attempt to quit: 12/24/1988    Years since quitting: 28.7  . Smokeless tobacco: Never Used  Substance and Sexual Activity  . Alcohol use: No  . Drug use: No  . Sexual activity: Yes    Partners: Female

## 2017-10-30 ENCOUNTER — Ambulatory Visit (INDEPENDENT_AMBULATORY_CARE_PROVIDER_SITE_OTHER): Payer: Managed Care, Other (non HMO) | Admitting: Orthopedic Surgery

## 2017-10-30 ENCOUNTER — Encounter (INDEPENDENT_AMBULATORY_CARE_PROVIDER_SITE_OTHER): Payer: Self-pay | Admitting: Orthopedic Surgery

## 2017-10-30 DIAGNOSIS — L97511 Non-pressure chronic ulcer of other part of right foot limited to breakdown of skin: Secondary | ICD-10-CM | POA: Diagnosis not present

## 2017-10-30 DIAGNOSIS — B351 Tinea unguium: Secondary | ICD-10-CM | POA: Diagnosis not present

## 2017-10-30 DIAGNOSIS — E11621 Type 2 diabetes mellitus with foot ulcer: Secondary | ICD-10-CM

## 2017-10-30 NOTE — Progress Notes (Signed)
Office Visit Note   Patient: [name]           Date of Birth: 10-Apr-1950           MRN: [id number] Visit Date: 10/30/2017              Requested by: Fanny Bien, [address] [place], [place] [address] PCP: Fanny Bien, MD  Chief Complaint  Patient presents with  . Left Foot - Follow-up  . Right Foot - Follow-up      HPI: Patient is a 67 year old gentleman who states he is having a lot of pain from the ulcer on the plantar aspect of the right foot.  Patient also complains of increased callus from the ulcer on the left foot.  Patient complains of painful thickened discolored onychomycotic nails x9 which she is unable to safely trim on his own.  Assessment & Plan: Visit Diagnoses:  1. Non-pressure chronic ulcer of other part of right foot limited to breakdown of skin ([place])   2. Onychomycosis     Plan: Nails trimmed x9 without complications ulcers debrided x2.  Patient will continue with his compression for the venous insufficiency.  Follow-Up Instructions: Return in about 1 month (around 11/27/2017).   Ortho Exam  Patient is alert, oriented, no adenopathy, well-dressed, normal affect, normal respiratory effort. Examination patient has an antalgic gait he has a Waggoner grade 1 ulcer beneath the fifth metatarsal base bilaterally.  The ulcer on the right side is 2 cm in diameter the ulcer on the left is 1 cm diameter.  After informed consent a 10 blade knife was used to debride the skin and soft tissue back to healthy viable granulation tissue.  The ulcer on the right after debridement is 2 cm in diameter 2 mm deep to the left ulcer is 1 cm diameter to patient has thickened discolored onychomycotic nails x9 there is no signs of paronychial infection and the nails are trimmed x9 without complications.  Patient has does have brawny skin color swelling in both lower extremities and recommended that he wear his compression stockings.  Imaging: No results  found. No images are attached to the encounter.  Labs: Lab Results  Component Value Date   HGBA1C 6.0 (H) 12/24/2015   HGBA1C 7.0 (H) 12/25/2013   HGBA1C 10.0 (H) 09/15/2012   ESRSEDRATE 53 (H) 09/15/2012   REPTSTATUS 12/29/2015 FINAL 12/24/2015   CULT NO GROWTH 5 DAYS 12/24/2015     Lab Results  Component Value Date   ALBUMIN 3.7 12/24/2015   ALBUMIN 3.7 12/28/2014   ALBUMIN 3.2 (L) 09/15/2012    There is no height or weight on file to calculate BMI.  Orders:  No orders of the defined types were placed in this encounter.  No orders of the defined types were placed in this encounter.    Procedures: No procedures performed  Clinical Data: No additional findings.  ROS:  All other systems negative, except as noted in the HPI. Review of Systems  Objective: Vital Signs: There were no vitals taken for this visit.  Specialty Comments:  No specialty comments available.  PMFS History: Patient Active Problem List   Diagnosis Date Noted  . Onychomycosis 10/30/2017  . Other spondylosis with radiculopathy, cervical region 01/27/2017  . Midfoot ulcer, left, limited to breakdown of skin ([place]) 11/15/2016  . Lateral epicondylitis, left elbow 08/12/2016  . Cellulitis of fifth toe of right foot 07/29/2016  . Prostate cancer ([place]) 06/19/2016  .  Non-pressure chronic ulcer of other part of right foot limited to breakdown of skin ([place]) 04/04/2016  . Cellulitis of leg, right 12/24/2015  . Acute kidney injury superimposed on CKD ([place]) 12/24/2015  . Cellulitis of right lower extremity 12/24/2015  . CKD (chronic kidney disease), stage III ([place]) 12/25/2013  . Foot infection 12/24/2013  . Foot ulcer, left ([place]) 12/24/2013  . Cellulitis 09/14/2012  . Diabetic foot ulcer ([place]) 09/14/2012  . Gout 09/14/2012  . HTN (hypertension) 09/14/2012  . Hyperlipidemia 09/14/2012  . Hypothyroidism 09/14/2012  . CAD (coronary artery disease) 09/14/2012  . Diabetes mellitus ([place]) 09/14/2012   . Hypercholesteremia   . Neuropathy [facility])    Past Medical History:  Diagnosis Date  . Coronary artery disease   . Diabetes mellitus without complication ([place])   . Hypercholesteremia   . Hypertension   . Neuropathy   . Prostate cancer [facility])     Family History  Problem Relation Age of Onset  . Diabetes Mellitus II Mother   . Diabetes Mellitus II Father   . CAD Father   . Cancer Father        prostate    Past Surgical History:  Procedure Laterality Date  . AMPUTATION Left 12/25/2013   Procedure: AMPUTATION RAY LEFT 5TH RAY;  Surgeon: [name], MD;  Location: [facility];  Service: Orthopedics;  Laterality: Left;  . BACK SURGERY    . CHOLECYSTECTOMY    . CORONARY ARTERY BYPASS GRAFT     Social History   Occupational History  . Not on file  Tobacco Use  . Smoking status: Former Smoker    Types: Cigars    Last attempt to quit: 12/24/1988    Years since quitting: 28.8  . Smokeless tobacco: Never Used  Substance and Sexual Activity  . Alcohol use: No  . Drug use: No  . Sexual activity: Yes    Partners: Female

## 2017-12-01 ENCOUNTER — Ambulatory Visit (INDEPENDENT_AMBULATORY_CARE_PROVIDER_SITE_OTHER): Payer: Managed Care, Other (non HMO) | Admitting: Orthopedic Surgery

## 2017-12-22 ENCOUNTER — Ambulatory Visit (INDEPENDENT_AMBULATORY_CARE_PROVIDER_SITE_OTHER): Payer: Managed Care, Other (non HMO) | Admitting: Orthopedic Surgery

## 2017-12-29 ENCOUNTER — Ambulatory Visit (INDEPENDENT_AMBULATORY_CARE_PROVIDER_SITE_OTHER): Payer: Managed Care, Other (non HMO) | Admitting: Orthopedic Surgery

## 2017-12-29 ENCOUNTER — Encounter (INDEPENDENT_AMBULATORY_CARE_PROVIDER_SITE_OTHER): Payer: Self-pay | Admitting: Orthopedic Surgery

## 2017-12-29 VITALS — Ht 70.0 in | Wt 236.0 lb

## 2017-12-29 DIAGNOSIS — L97511 Non-pressure chronic ulcer of other part of right foot limited to breakdown of skin: Secondary | ICD-10-CM | POA: Diagnosis not present

## 2017-12-29 DIAGNOSIS — B351 Tinea unguium: Secondary | ICD-10-CM

## 2017-12-29 DIAGNOSIS — M7661 Achilles tendinitis, right leg: Secondary | ICD-10-CM | POA: Diagnosis not present

## 2017-12-29 DIAGNOSIS — L97421 Non-pressure chronic ulcer of left heel and midfoot limited to breakdown of skin: Secondary | ICD-10-CM | POA: Diagnosis not present

## 2017-12-29 NOTE — Progress Notes (Signed)
Office Visit Note   Patient: [name]           Date of Birth: 04-10-1950           MRN: [id number] Visit Date: 12/29/2017              Requested by: Fanny Bien, [address] [place],  [address] PCP: Fanny Bien, MD  Chief Complaint  Patient presents with  . Left Foot - Pain  . Right Foot - Follow-up, Wound Check      HPI: Patient is a 67 year old gentleman with diabetic insensate neuropathy venous insufficiency heel cord contracture with foot drop on the left with severe neuropathy.  Patient states the Lyrica does help.  He has a chronic ulcer over the base of the fifth metatarsal right foot.  Patient states he is used total contact cast in the past.  He states that this did not work.  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  He does have a brace for the foot drop on the left he has modified orthotics for both feet.  Assessment & Plan: Visit Diagnoses:  1. Non-pressure chronic ulcer of other part of right foot limited to breakdown of skin ([place])   2. Onychomycosis   3. Achilles tendinitis, right leg   4. Midfoot ulcer, left, limited to breakdown of skin ([place])     Plan: Patient will continue with Achilles stretching he will stop the workouts using the pressure on the foot use knee extension knee flexion exercises with no weightbearing on the feet.  Minimize his activities continue current care.  Follow-Up Instructions: Return in about 4 weeks (around 01/26/2018).   Ortho Exam  Patient is alert, oriented, no adenopathy, well-dressed, normal affect, normal respiratory effort. Examination patient has a good dorsalis pedis pulse bilaterally he has venous insufficiency with brawny skin color changes and pitting edema but no open venous ulcers.  Patient's feet are thin and atrophic he has equinus contractures bilaterally left worse than right he has minimal subtalar motion.  He has a Waggoner grade 1 ulcer beneath the fifth  metatarsal base on the right.  After informed consent a 10 blade knife was used to debride the skin and soft tissue back to healthy viable bleeding granulation tissue.  There is no exposed bone or tendon.  Silver nitrate was used for hemostasis.  The ulcer is 15 mm in diameter and 3 mm deep.  A Band-Aid was applied.  On the left foot he has some callus at the base of the fifth metatarsal on the left foot.  The callus was pared silver nitrate was used for hemostasis this area was 5 mm in diameter 1 mm deep.  Patient has a varus hindfoot bilaterally.  Imaging: No results found. No images are attached to the encounter.  Labs: Lab Results  Component Value Date   HGBA1C 6.0 (H) 12/24/2015   HGBA1C 7.0 (H) 12/25/2013   HGBA1C 10.0 (H) 09/15/2012   ESRSEDRATE 53 (H) 09/15/2012   REPTSTATUS 12/29/2015 FINAL 12/24/2015   CULT NO GROWTH 5 DAYS 12/24/2015     Lab Results  Component Value Date   ALBUMIN 3.7 12/24/2015   ALBUMIN 3.7 12/28/2014   ALBUMIN 3.2 (L) 09/15/2012    Body mass index is 33.86 kg/m.  Orders:  No orders of the defined types were placed in this encounter.  No orders of the defined types were placed in this encounter.    Procedures:  No procedures performed  Clinical Data: No additional findings.  ROS:  All other systems negative, except as noted in the HPI. Review of Systems  Objective: Vital Signs: Ht 5\' 10"  (1.778 m)   Wt 236 lb (107 kg)   BMI 33.86 kg/m   Specialty Comments:  No specialty comments available.  PMFS History: Patient Active Problem List   Diagnosis Date Noted  . Onychomycosis 10/30/2017  . Other spondylosis with radiculopathy, cervical region 01/27/2017  . Midfoot ulcer, left, limited to breakdown of skin ([place]) 11/15/2016  . Lateral epicondylitis, left elbow 08/12/2016  . Cellulitis of fifth toe of right foot 07/29/2016  . Prostate cancer ([place]) 06/19/2016  . Non-pressure chronic ulcer of other part of right foot limited to  breakdown of skin ([place]) 04/04/2016  . Cellulitis of leg, right 12/24/2015  . Acute kidney injury superimposed on CKD ([place]) 12/24/2015  . Cellulitis of right lower extremity 12/24/2015  . CKD (chronic kidney disease), stage III ([place]) 12/25/2013  . Foot infection 12/24/2013  . Foot ulcer, left ([place]) 12/24/2013  . Cellulitis 09/14/2012  . Diabetic foot ulcer ([place]) 09/14/2012  . Gout 09/14/2012  . HTN (hypertension) 09/14/2012  . Hyperlipidemia 09/14/2012  . Hypothyroidism 09/14/2012  . CAD (coronary artery disease) 09/14/2012  . Diabetes mellitus ([place]) 09/14/2012  . Hypercholesteremia   . Neuropathy [facility])    Past Medical History:  Diagnosis Date  . Coronary artery disease   . Diabetes mellitus without complication ([place])   . Hypercholesteremia   . Hypertension   . Neuropathy   . Prostate cancer [facility])     Family History  Problem Relation Age of Onset  . Diabetes Mellitus II Mother   . Diabetes Mellitus II Father   . CAD Father   . Cancer Father        prostate    Past Surgical History:  Procedure Laterality Date  . AMPUTATION Left 12/25/2013   Procedure: AMPUTATION RAY LEFT 5TH RAY;  Surgeon: [name], MD;  Location: [facility];  Service: Orthopedics;  Laterality: Left;  . BACK SURGERY    . CHOLECYSTECTOMY    . CORONARY ARTERY BYPASS GRAFT     Social History   Occupational History  . Not on file  Tobacco Use  . Smoking status: Former Smoker    Types: Cigars    Last attempt to quit: 12/24/1988    Years since quitting: 29.0  . Smokeless tobacco: Never Used  Substance and Sexual Activity  . Alcohol use: No  . Drug use: No  . Sexual activity: Yes    Partners: Female

## 2018-01-14 ENCOUNTER — Encounter (INDEPENDENT_AMBULATORY_CARE_PROVIDER_SITE_OTHER): Payer: Self-pay | Admitting: Orthopedic Surgery

## 2018-01-14 ENCOUNTER — Ambulatory Visit (INDEPENDENT_AMBULATORY_CARE_PROVIDER_SITE_OTHER): Payer: Managed Care, Other (non HMO) | Admitting: Orthopedic Surgery

## 2018-01-14 VITALS — Ht 70.0 in | Wt 236.0 lb

## 2018-01-14 DIAGNOSIS — L97421 Non-pressure chronic ulcer of left heel and midfoot limited to breakdown of skin: Secondary | ICD-10-CM

## 2018-01-14 DIAGNOSIS — L97511 Non-pressure chronic ulcer of other part of right foot limited to breakdown of skin: Secondary | ICD-10-CM

## 2018-01-14 NOTE — Progress Notes (Signed)
Office Visit Note   Patient: [name]           Date of Birth: 1951-02-24           MRN: [id number] Visit Date: 01/14/2018              Requested by: Fanny Bien, [address] [place], [place] [address] PCP: Fanny Bien, MD  Chief Complaint  Patient presents with  . Right Foot - Wound Check  . Left Foot - Wound Check      HPI: Patient is a 67 year old gentleman who presents in follow-up for ulceration lateral column of both feet.  He has a posterior AFO on the left with a carbon plate in the shoe.  There are chronic ulcers over the base of the fifth metatarsals bilaterally.  Assessment & Plan: Visit Diagnoses:  1. Non-pressure chronic ulcer of other part of right foot limited to breakdown of skin ([place])   2. Midfoot ulcer, left, limited to breakdown of skin ([place])     Plan: New felt relieving pads were placed beneath his custom orthotics to unload pressure from the carbon plate and unload the base of the fifth metatarsal bilaterally.  Continue with dressing changes with a Band-Aid.  Follow-Up Instructions: Return in about 3 weeks (around 02/04/2018).   Ortho Exam  Patient is alert, oriented, no adenopathy, well-dressed, normal affect, normal respiratory effort. Examination patient has Waggoner grade 1 ulcers base of the fifth metatarsal bilaterally.  These ulcers are superficial 1 cm diameter 1 mm deep they do not probe to bone or tendon.  There is no cellulitis there is good granulation tissue at the base.  Imaging: No results found. No images are attached to the encounter.  Labs: Lab Results  Component Value Date   HGBA1C 6.0 (H) 12/24/2015   HGBA1C 7.0 (H) 12/25/2013   HGBA1C 10.0 (H) 09/15/2012   ESRSEDRATE 53 (H) 09/15/2012   REPTSTATUS 12/29/2015 FINAL 12/24/2015   CULT NO GROWTH 5 DAYS 12/24/2015     Lab Results  Component Value Date   ALBUMIN 3.7 12/24/2015   ALBUMIN 3.7 12/28/2014   ALBUMIN 3.2 (L) 09/15/2012    Body mass  index is 33.86 kg/m.  Orders:  No orders of the defined types were placed in this encounter.  No orders of the defined types were placed in this encounter.    Procedures: No procedures performed  Clinical Data: No additional findings.  ROS:  All other systems negative, except as noted in the HPI. Review of Systems  Objective: Vital Signs: Ht 5\' 10"  (1.778 m)   Wt 236 lb (107 kg)   BMI 33.86 kg/m   Specialty Comments:  No specialty comments available.  PMFS History: Patient Active Problem List   Diagnosis Date Noted  . Onychomycosis 10/30/2017  . Other spondylosis with radiculopathy, cervical region 01/27/2017  . Midfoot ulcer, left, limited to breakdown of skin ([place]) 11/15/2016  . Lateral epicondylitis, left elbow 08/12/2016  . Cellulitis of fifth toe of right foot 07/29/2016  . Prostate cancer ([place]) 06/19/2016  . Non-pressure chronic ulcer of other part of right foot limited to breakdown of skin ([place]) 04/04/2016  . Cellulitis of leg, right 12/24/2015  . Acute kidney injury superimposed on CKD ([place]) 12/24/2015  . Cellulitis of right lower extremity 12/24/2015  . CKD (chronic kidney disease), stage III ([place]) 12/25/2013  . Foot infection 12/24/2013  . Foot ulcer, left ([place]) 12/24/2013  . Cellulitis 09/14/2012  . Diabetic  foot ulcer ([place]) 09/14/2012  . Gout 09/14/2012  . HTN (hypertension) 09/14/2012  . Hyperlipidemia 09/14/2012  . Hypothyroidism 09/14/2012  . CAD (coronary artery disease) 09/14/2012  . Diabetes mellitus ([place]) 09/14/2012  . Hypercholesteremia   . Neuropathy [facility])    Past Medical History:  Diagnosis Date  . Coronary artery disease   . Diabetes mellitus without complication ([place])   . Hypercholesteremia   . Hypertension   . Neuropathy   . Prostate cancer [facility])     Family History  Problem Relation Age of Onset  . Diabetes Mellitus II Mother   . Diabetes Mellitus II Father   . CAD Father   . Cancer Father        prostate    Past  Surgical History:  Procedure Laterality Date  . AMPUTATION Left 12/25/2013   Procedure: AMPUTATION RAY LEFT 5TH RAY;  Surgeon: [name], MD;  Location: [facility];  Service: Orthopedics;  Laterality: Left;  . BACK SURGERY    . CHOLECYSTECTOMY    . CORONARY ARTERY BYPASS GRAFT     Social History   Occupational History  . Not on file  Tobacco Use  . Smoking status: Former Smoker    Types: Cigars    Last attempt to quit: 12/24/1988    Years since quitting: 29.0  . Smokeless tobacco: Never Used  Substance and Sexual Activity  . Alcohol use: No  . Drug use: No  . Sexual activity: Yes    Partners: Female

## 2018-01-26 ENCOUNTER — Ambulatory Visit (INDEPENDENT_AMBULATORY_CARE_PROVIDER_SITE_OTHER): Payer: Managed Care, Other (non HMO) | Admitting: Orthopedic Surgery

## 2018-02-05 ENCOUNTER — Encounter (INDEPENDENT_AMBULATORY_CARE_PROVIDER_SITE_OTHER): Payer: Self-pay | Admitting: Orthopedic Surgery

## 2018-02-05 ENCOUNTER — Ambulatory Visit (INDEPENDENT_AMBULATORY_CARE_PROVIDER_SITE_OTHER): Payer: Managed Care, Other (non HMO) | Admitting: Orthopedic Surgery

## 2018-02-05 VITALS — Ht 70.0 in | Wt 236.0 lb

## 2018-02-05 DIAGNOSIS — L97511 Non-pressure chronic ulcer of other part of right foot limited to breakdown of skin: Secondary | ICD-10-CM

## 2018-02-06 ENCOUNTER — Ambulatory Visit: Payer: Managed Care, Other (non HMO) | Admitting: Neurology

## 2018-02-06 ENCOUNTER — Encounter: Payer: Self-pay | Admitting: Neurology

## 2018-02-06 VITALS — BP 141/81 | HR 75 | Ht 70.0 in | Wt 225.0 lb

## 2018-02-06 DIAGNOSIS — E1142 Type 2 diabetes mellitus with diabetic polyneuropathy: Secondary | ICD-10-CM

## 2018-02-06 DIAGNOSIS — E538 Deficiency of other specified B group vitamins: Secondary | ICD-10-CM

## 2018-02-06 DIAGNOSIS — G959 Disease of spinal cord, unspecified: Secondary | ICD-10-CM | POA: Insufficient documentation

## 2018-02-06 DIAGNOSIS — E1140 Type 2 diabetes mellitus with diabetic neuropathy, unspecified: Secondary | ICD-10-CM | POA: Insufficient documentation

## 2018-02-06 HISTORY — DX: Type 2 diabetes mellitus with diabetic neuropathy, unspecified: E11.40

## 2018-02-06 HISTORY — DX: Disease of spinal cord, unspecified: G95.9

## 2018-02-06 MED ORDER — PREGABALIN 75 MG PO CAPS: 150.0000 mg | ORAL_CAPSULE | Freq: Two times a day (BID) | ORAL | Status: DC

## 2018-02-06 MED ORDER — LYRICA 150 MG PO CAPS: 150.0000 mg | ORAL_CAPSULE | Freq: Every day | ORAL | Status: DC

## 2018-02-06 MED ORDER — DULOXETINE HCL 30 MG PO CPEP: 30.0000 mg | ORAL_CAPSULE | Freq: Every day | ORAL | 3 refills | Status: DC

## 2018-02-06 NOTE — Progress Notes (Signed)
Reason for visit: Peripheral neuropathy  Referring physician: Dr. Parke Poisson is a 67 y.o. male  History of present illness:  [name] is a 67 year old right-handed white male with a history of diabetes and a diabetic neuropathy both dating back about 10 years.  The patient has had gradual worsening of his neuropathy over time, he claims that about 9 years ago he did undergo nerve conduction studies and there was no response on the nerve conductions of the legs, he was told he had a severe neuropathy at that time.  The patient has had gradual worsening of balance, he will fall on occasion, he has an AFO brace on the left with severe foot drop, he has a more mild foot drop on the right but does not use a brace.  He has had problems with ulcerations on the feet that have been slow to heal.  He reports that he has had cervical spine surgery a year ago, he had spinal cord compression prior to surgery.  The patient has also had lumbosacral spine surgery in the past.  The patient reports ongoing discomfort mainly in the feet that is associated with a throbbing sensation mainly on the right foot and burning sensations in both feet.  The patient denies issues with controlling the bowels or the bladder.  He does not use a cane or a walker for ambulation.  The patient mainly has discomfort in the evening hours.  He does have some back pain and some occasional pain down the leg on the left.  The patient also has some left arm pain.  He is on Lyrica, he is on maximum doses for his renal failure, he claims that his creatinine runs between the upper 2 range and the low 3 range.  The patient therefore likely has stage IV renal insufficiency.  Past Medical History:  Diagnosis Date  . Coronary artery disease   . Diabetes mellitus without complication ([place])   . Diabetic neuropathy ([place]) 02/06/2018  . Hypercholesteremia   . Hypertension   . Neuropathy   . Prostate cancer [facility])     Past Surgical  History:  Procedure Laterality Date  . AMPUTATION Left 12/25/2013   Procedure: AMPUTATION RAY LEFT 5TH RAY;  Surgeon: [name], MD;  Location: [facility];  Service: Orthopedics;  Laterality: Left;  . BACK SURGERY    . CHOLECYSTECTOMY    . CORONARY ARTERY BYPASS GRAFT      Family History  Problem Relation Age of Onset  . Diabetes Mellitus II Mother   . Diabetes Mellitus II Father   . CAD Father   . Cancer Father        prostate  . Diabetes Mellitus II Brother   . Diabetes Mellitus II Brother   . Cancer Brother     Social history:  reports that he quit smoking about 29 years ago. His smoking use included cigars. He has never used smokeless tobacco. He reports that he does not drink alcohol or use drugs.  Medications:  Prior to Admission medications   Medication Sig Start Date End Date Taking? Authorizing Provider  aspirin EC 81 MG tablet Take 81 mg by mouth at bedtime.    Yes [provider]  carvedilol (COREG) 25 MG tablet  11/22/16  Yes [provider]  diclofenac sodium (VOLTAREN) 1 % GEL Apply 1 application topically daily.   Yes [provider]  Esomeprazole Magnesium (NEXIUM PO) Take 22.3 mg by mouth daily.  Yes [provider]  ezetimibe (ZETIA) 10 MG tablet Take 10 mg by mouth daily.   Yes [provider]  guaiFENesin-codeine 100-10 MG/5ML syrup Take 5-10 mLs by mouth every 4 (four) hours as needed for cough. 08/15/16  Yes Tyler Pita, MD  Insulin Degludec (TRESIBA FLEXTOUCH) 200 UNIT/ML SOPN Inject 40 Units into the skin at bedtime. Patient taking differently: Inject 30 Units into the skin at bedtime.  12/27/15  Yes Domenic Polite, MD  levothyroxine (SYNTHROID, LEVOTHROID) 112 MCG tablet Take 112 mcg by mouth daily before breakfast.   Yes [provider]  LYRICA 150 MG capsule  12/09/16  Yes [provider]  OVER THE COUNTER MEDICATION Take 1 tablet by mouth See admin instructions. Take 1 tablet by mouth every  2 hours during the night as needed for leg cramps: Hyland's Leg Cramps PM   Yes [provider]  pregabalin (LYRICA) 75 MG capsule Take 1 capsule (75 mg total) by mouth at bedtime. 12/27/15  Yes Domenic Polite, MD  rosuvastatin (CRESTOR) 10 MG tablet Take 10 mg by mouth daily.   Yes [provider]  Semaglutide (OZEMPIC, 0.25 OR 0.5 MG/DOSE, [place]) Inject into the skin once a week.   Yes [provider]  sildenafil (VIAGRA) 25 MG tablet Take 25 mg by mouth daily as needed for erectile dysfunction.   Yes [provider]  tadalafil (CIALIS) 5 MG tablet Take 5 mg by mouth daily as needed for erectile dysfunction.   Yes [provider]  tamsulosin (FLOMAX) 0.4 MG CAPS capsule Take 1 capsule (0.4 mg total) by mouth daily after supper. 08/23/16  Yes Tyler Pita, MD  triamcinolone (KENALOG) 0.025 % cream Apply 1 application topically daily as needed (psoriasis).   Yes [provider]      Allergies  Allergen Reactions  . Mushroom Extract Complex Nausea Only    ROS:  Out of a complete 14 system review of symptoms, the patient complains only of the following symptoms, and all other reviewed systems are negative.  Foot pain Walking problems  Blood pressure (!) 141/81, pulse 75, height 5\' 10"  (1.778 m), weight 225 lb (102.1 kg).  Physical Exam  General: The patient is alert and cooperative at the time of the examination.  Eyes: Pupils are equal, round, and reactive to light. Discs are flat bilaterally.  Neck: The neck is supple, no carotid bruits are noted.  Respiratory: The respiratory examination is clear.  Cardiovascular: The cardiovascular examination reveals a regular rate and rhythm, no obvious murmurs or rubs are noted.  Skin: Extremities are without significant edema.  Neurologic Exam  Mental status: The patient is alert and oriented x 3 at the time of the examination. The patient has apparent normal recent and remote  memory, with an apparently normal attention span and concentration ability.  Cranial nerves: Facial symmetry is present. There is good sensation of the face to pinprick and soft touch bilaterally. The strength of the facial muscles and the muscles to head turning and shoulder shrug are normal bilaterally. Speech is well enunciated, no aphasia or dysarthria is noted. Extraocular movements are full. Visual fields are full. The tongue is midline, and the patient has symmetric elevation of the soft palate. No obvious hearing deficits are noted.  Motor: The motor testing reveals 5 over 5 strength of the upper extremities, with exception of 4/5 strength of the intrinsic muscles of the left hand, atrophy of the first dorsal interosseous muscle.  With the lower extremities, there  is 4/5 strength with hip flexion on the left, 4+/5 strength with hip flexion on the right.  The patient is a very prominent foot drop on the left, slight foot drop on the right.  Good symmetric motor tone is noted throughout.  Sensory: Sensory testing is intact to pinprick, soft touch, vibration sensation, and position sense on the upper extremities.  With the lower extremities there is a stocking pattern pinprick sensory deficit to the knees bilaterally, significant impairment of vibration sensation in position sense is seen in both feet.  No evidence of extinction is noted.  Coordination: Cerebellar testing reveals good finger-nose-finger and heel-to-shin bilaterally.  Gait and station: Gait is wide-based, unsteady.  The patient has a steppage gait with the left leg.  The patient usually has an AFO brace on the left.  Tandem gait was not attempted.  Romberg is positive, the patient will fall backwards.  Reflexes: Deep tendon reflexes are depressed but symmetric in the arms, with the lower extremities the knee jerk reflexes are slightly brisk but are more prominent on the left than the right.  The right ankle jerk reflexes absent, the  left ankle jerk reflex is present and may be slightly brisk. Toes are downgoing bilaterally.   MRI cervical 01/20/18:  IMPRESSION: 1. Advanced degenerative spondylolysis extending from C3-4 through C6-7 with moderate to severe diffuse canal stenosis (severe at C5-6 and C6-7). 2. Abnormal cord signal intensity within the left aspect of the cord at C6-7, consistent with myelomalacia and/or edema. 3. Multifactorial degenerative changes with resultant multilevel foraminal narrowing as above. Notable findings include moderate left C3 foraminal narrowing, severe bilateral C4 foraminal stenosis, moderate bilateral C5 foraminal narrowing, severe left C6 foraminal narrowing stenosis, severe bilateral C7 foraminal stenosis, and moderate right C8 foraminal narrowing.  * MRI scan images were reviewed online. I agree with the written report.    Assessment/Plan:  1.  Diabetic peripheral neuropathy  2.  Cervical myelopathy  3.  Gait disorder  The patient likely has a combination of issues with the cervical myelopathy and the peripheral neuropathy.  The patient is on a maximum dose of the Lyrica, he will be given a prescription for Cymbalta starting at 30 mg in the evening, he will call for any dose adjustments.  The patient will follow-up in 6 months.  The patient does not wish to undergo a repeat EMG nerve conduction study.  The patient will undergo blood work today.  Jill Alexanders MD 02/06/2018 8:47 AM  [facility] [address] [place], [place] [address]  Phone [phone] Fax [phone]

## 2018-02-06 NOTE — Patient Instructions (Signed)
We will start Cymbalta for the diabetic neuropathy pain.   Cymbalta (duloxetine) is an antidepressant medication that is commonly used for peripheral neuropathy pain or for fibromyalgia pain. As with any antidepressant medication, worsening depression can be seen. This medication can potentially cause headache, dizziness, sexual dysfunction, or nausea. If any problems are noted on this medication, please contact our office.

## 2018-02-09 ENCOUNTER — Encounter (INDEPENDENT_AMBULATORY_CARE_PROVIDER_SITE_OTHER): Payer: Self-pay | Admitting: Orthopedic Surgery

## 2018-02-09 NOTE — Progress Notes (Addendum)
Office Visit Note   Patient: [name]           Date of Birth: [birth year]           MRN: [id number] Visit Date: 02/05/2018              Requested by: Fanny Bien, [address], [place] [address] PCP: Fanny Bien, MD  Chief Complaint  Patient presents with  . Left Foot - Wound Check  . Right Foot - Wound Check      HPI: Patient presents in follow-up status post left fifth ray amputation.   Assessment & Plan: Visit Diagnoses:  1. Non-pressure chronic ulcer of other part of right foot limited to breakdown of skin ([place])     Plan: Patient is showing good steady improvement he will continue with his orthotics the brace on the left reevaluate in 4 weeks.  Follow-Up Instructions: Return in about 4 weeks (around 03/05/2018).   Ortho Exam  Patient is alert, oriented, no adenopathy, well-dressed, normal affect, normal respiratory effort. Examination patient has ulceration of the base of the fifth metatarsal bilaterally ulcerative area about 5 mm in diameter.  There is good healthy granulation tissue there is no redness no cellulitis no drainage no odor no signs of infection.  Imaging: No results found. No images are attached to the encounter.  Labs: Lab Results  Component Value Date   HGBA1C 6.0 (H) 12/24/2015   HGBA1C 7.0 (H) 12/25/2013   HGBA1C 10.0 (H) 09/15/2012   ESRSEDRATE 16 02/06/2018   ESRSEDRATE 53 (H) 09/15/2012   REPTSTATUS 12/29/2015 FINAL 12/24/2015   CULT NO GROWTH 5 DAYS 12/24/2015     Lab Results  Component Value Date   ALBUMIN 3.7 12/24/2015   ALBUMIN 3.7 12/28/2014   ALBUMIN 3.2 (L) 09/15/2012    Body mass index is 33.86 kg/m.  Orders:  No orders of the defined types were placed in this encounter.  No orders of the defined types were placed in this encounter.    Procedures: No procedures performed  Clinical Data: No additional findings.  ROS:  All other systems negative, except as noted in the  HPI. Review of Systems  Objective: Vital Signs: Ht 5\' 10"  (1.778 m)   Wt 236 lb (107 kg)   BMI 33.86 kg/m   Specialty Comments:  No specialty comments available.  PMFS History: Patient Active Problem List   Diagnosis Date Noted  . Diabetic neuropathy ([place]) 02/06/2018  . Cervical myelopathy ([place]) 02/06/2018  . Onychomycosis 10/30/2017  . Other spondylosis with radiculopathy, cervical region 01/27/2017  . Midfoot ulcer, left, limited to breakdown of skin ([name]) 11/15/2016  . Lateral epicondylitis, left elbow 08/12/2016  . Cellulitis of fifth toe of right foot 07/29/2016  . Prostate cancer ([place]) 06/19/2016  . Non-pressure chronic ulcer of other part of right foot limited to breakdown of skin ([place]) 04/04/2016  . Cellulitis of leg, right 12/24/2015  . Acute kidney injury superimposed on CKD ([place]) 12/24/2015  . Cellulitis of right lower extremity 12/24/2015  . CKD (chronic kidney disease), stage III ([place]) 12/25/2013  . Foot infection 12/24/2013  . Foot ulcer, left ([place]) 12/24/2013  . Cellulitis 09/14/2012  . Diabetic foot ulcer ([place]) 09/14/2012  . Gout 09/14/2012  . HTN (hypertension) 09/14/2012  . Hyperlipidemia 09/14/2012  . Hypothyroidism 09/14/2012  . CAD (coronary artery disease) 09/14/2012  . Diabetes mellitus ([place]) 09/14/2012  . Hypercholesteremia   . Neuropathy [facility])    Past Medical  History:  Diagnosis Date  . Cervical myelopathy ([place]) 02/06/2018  . Coronary artery disease   . Diabetes mellitus without complication ([place])   . Diabetic neuropathy ([place]) 02/06/2018  . Hypercholesteremia   . Hypertension   . Neuropathy   . Prostate cancer [facility])     Family History  Problem Relation Age of Onset  . Diabetes Mellitus II Mother   . Diabetes Mellitus II Father   . CAD Father   . Cancer Father        prostate  . Diabetes Mellitus II Brother   . Diabetes Mellitus II Brother   . Cancer Brother     Past Surgical History:  Procedure Laterality Date  . AMPUTATION Left  12/25/2013   Procedure: AMPUTATION RAY LEFT 5TH RAY;  Surgeon: [name], MD;  Location: [facility];  Service: Orthopedics;  Laterality: Left;  . BACK SURGERY    . CHOLECYSTECTOMY    . CORONARY ARTERY BYPASS GRAFT     Social History   Occupational History  . Not on file  Tobacco Use  . Smoking status: Former Smoker    Types: Cigars    Last attempt to quit: 12/24/1988    Years since quitting: 29.1  . Smokeless tobacco: Never Used  Substance and Sexual Activity  . Alcohol use: No  . Drug use: No  . Sexual activity: Yes    Partners: Female

## 2018-02-12 ENCOUNTER — Telehealth: Payer: Self-pay | Admitting: Neurology

## 2018-02-12 LAB — MULTIPLE MYELOMA PANEL, SERUM
ALPHA2 GLOB SERPL ELPH-MCNC: 0.8 g/dL (ref 0.4–1.0)
Albumin SerPl Elph-Mcnc: 3.7 g/dL (ref 2.9–4.4)
Albumin/Glob SerPl: 1.7 (ref 0.7–1.7)
Alpha 1: 0.2 g/dL (ref 0.0–0.4)
B-Globulin SerPl Elph-Mcnc: 0.8 g/dL (ref 0.7–1.3)
Gamma Glob SerPl Elph-Mcnc: 0.4 g/dL (ref 0.4–1.8)
Globulin, Total: 2.2 g/dL (ref 2.2–3.9)
IGM (IMMUNOGLOBULIN M), SRM: 42 mg/dL (ref 20–172)
IgA/Immunoglobulin A, Serum: 87 mg/dL (ref 61–437)
IgG (Immunoglobin G), Serum: 460 mg/dL — ABNORMAL LOW (ref 700–1600)
TOTAL PROTEIN: 5.9 g/dL — AB (ref 6.0–8.5)

## 2018-02-12 LAB — B. BURGDORFI ANTIBODIES

## 2018-02-12 LAB — ENA+DNA/DS+SJORGEN'S
ENA RNP Ab: 1 AI — ABNORMAL HIGH (ref 0.0–0.9)
ENA SM Ab Ser-aCnc: <0.2 AI (ref 0.0–0.9)
ENA SSA (RO) Ab: <0.2 AI (ref 0.0–0.9)
ENA SSB (LA) Ab: <0.2 AI (ref 0.0–0.9)

## 2018-02-12 LAB — ANGIOTENSIN CONVERTING ENZYME: Angio Convert Enzyme: 47 U/L (ref 14–82)

## 2018-02-12 LAB — VITAMIN B12: VITAMIN B 12: 319 pg/mL (ref 232–1245)

## 2018-02-12 LAB — ANA W/REFLEX: Anti Nuclear Antibody(ANA): POSITIVE — AB

## 2018-02-12 LAB — SEDIMENTATION RATE: SED RATE: 16 mm/h (ref 0–30)

## 2018-02-12 MED ORDER — DULOXETINE HCL 30 MG PO CPEP: 30.0000 mg | ORAL_CAPSULE | Freq: Two times a day (BID) | ORAL | 3 refills | Status: DC

## 2018-02-12 NOTE — Telephone Encounter (Signed)
I called patient.  The blood work essentially is normal, ANA is positive but the antibody panel shows only a very minimal elevation in the ENA RNP antibody, likely not clinically significant.  The patient is doing well so far with the Cymbalta, we will go up to 30 mg twice daily.

## 2018-03-09 ENCOUNTER — Ambulatory Visit (INDEPENDENT_AMBULATORY_CARE_PROVIDER_SITE_OTHER): Payer: Managed Care, Other (non HMO) | Admitting: Orthopedic Surgery

## 2018-03-09 ENCOUNTER — Encounter (INDEPENDENT_AMBULATORY_CARE_PROVIDER_SITE_OTHER): Payer: Self-pay | Admitting: Orthopedic Surgery

## 2018-03-09 VITALS — Ht 70.0 in | Wt 225.0 lb

## 2018-03-09 DIAGNOSIS — L97511 Non-pressure chronic ulcer of other part of right foot limited to breakdown of skin: Secondary | ICD-10-CM | POA: Diagnosis not present

## 2018-03-09 DIAGNOSIS — L97421 Non-pressure chronic ulcer of left heel and midfoot limited to breakdown of skin: Secondary | ICD-10-CM | POA: Diagnosis not present

## 2018-03-09 NOTE — Progress Notes (Signed)
Office Visit Note   Patient: [name]           Date of Birth: 31-Dec-1950           MRN: [id number] Visit Date: 03/09/2018              Requested by: Fanny Bien, [address] [place], [place] [address] PCP: Fanny Bien, MD  Chief Complaint  Patient presents with  . Left Foot - Follow-up  . Right Foot - Follow-up      HPI: Patient is a 67 year old gentleman who presents in follow-up for both feet with a chronic Wegner grade 1 ulcer beneath the fifth metatarsal head bilaterally.  Patient denies any acute problems.  Assessment & Plan: Visit Diagnoses:  1. Non-pressure chronic ulcer of other part of right foot limited to breakdown of skin ([place])   2. Midfoot ulcer, left, limited to breakdown of skin ([place])     Plan: Patient's ulcers are improving nicely he states that his sugars have been improved and this may be the reason.  Continue with the brace on the right foot continue with his protective shoe wear and orthotics.  Follow-Up Instructions: Return in about 4 weeks (around 04/06/2018).   Ortho Exam  Patient is alert, oriented, no adenopathy, well-dressed, normal affect, normal respiratory effort. Examination of both lower extremities patient does have venous insufficiency with pitting edema with brawny skin color changes but no open ulcers.  Recommend that he resume wearing his compression stockings.  He has a Medical illustrator grade 1 ulcer beneath the fifth metatarsal head of the left foot.  After informed consent a 10 blade knife was used to debride the skin and soft tissue back to healthy viable granulation tissue this was touched with silver nitrate this ulcer looks much improved it is 10 mm in diameter 0.1 mm deep.  Examination of the right foot he only has a callus beneath the fifth metatarsal head and the callus was pared without complications.  Imaging: No results found. No images are attached to the encounter.  Labs: Lab Results  Component Value Date     HGBA1C 6.0 (H) 12/24/2015   HGBA1C 7.0 (H) 12/25/2013   HGBA1C 10.0 (H) 09/15/2012   ESRSEDRATE 16 02/06/2018   ESRSEDRATE 53 (H) 09/15/2012   REPTSTATUS 12/29/2015 FINAL 12/24/2015   CULT NO GROWTH 5 DAYS 12/24/2015     Lab Results  Component Value Date   ALBUMIN 3.7 12/24/2015   ALBUMIN 3.7 12/28/2014   ALBUMIN 3.2 (L) 09/15/2012    Body mass index is 32.28 kg/m.  Orders:  No orders of the defined types were placed in this encounter.  No orders of the defined types were placed in this encounter.    Procedures: No procedures performed  Clinical Data: No additional findings.  ROS:  All other systems negative, except as noted in the HPI. Review of Systems  Objective: Vital Signs: Ht 5\' 10"  (1.778 m)   Wt 225 lb (102.1 kg)   BMI 32.28 kg/m   Specialty Comments:  No specialty comments available.  PMFS History: Patient Active Problem List   Diagnosis Date Noted  . Diabetic neuropathy ([place]) 02/06/2018  . Cervical myelopathy ([place]) 02/06/2018  . Onychomycosis 10/30/2017  . Other spondylosis with radiculopathy, cervical region 01/27/2017  . Midfoot ulcer, left, limited to breakdown of skin ([place]) 11/15/2016  . Lateral epicondylitis, left elbow 08/12/2016  . Cellulitis of fifth toe of right foot 07/29/2016  . Prostate cancer ([place])  06/19/2016  . Non-pressure chronic ulcer of other part of right foot limited to breakdown of skin ([place]) 04/04/2016  . Cellulitis of leg, right 12/24/2015  . Acute kidney injury superimposed on CKD ([place]) 12/24/2015  . Cellulitis of right lower extremity 12/24/2015  . CKD (chronic kidney disease), stage III ([place]) 12/25/2013  . Foot infection 12/24/2013  . Foot ulcer, left ([place]) 12/24/2013  . Cellulitis 09/14/2012  . Diabetic foot ulcer ([place]) 09/14/2012  . Gout 09/14/2012  . HTN (hypertension) 09/14/2012  . Hyperlipidemia 09/14/2012  . Hypothyroidism 09/14/2012  . CAD (coronary artery disease) 09/14/2012  . Diabetes mellitus  ([place]) 09/14/2012  . Hypercholesteremia   . Neuropathy [facility])    Past Medical History:  Diagnosis Date  . Cervical myelopathy ([place]) 02/06/2018  . Coronary artery disease   . Diabetes mellitus without complication ([place])   . Diabetic neuropathy ([place]) 02/06/2018  . Hypercholesteremia   . Hypertension   . Neuropathy   . Prostate cancer [facility])     Family History  Problem Relation Age of Onset  . Diabetes Mellitus II Mother   . Diabetes Mellitus II Father   . CAD Father   . Cancer Father        prostate  . Diabetes Mellitus II Brother   . Diabetes Mellitus II Brother   . Cancer Brother     Past Surgical History:  Procedure Laterality Date  . AMPUTATION Left 12/25/2013   Procedure: AMPUTATION RAY LEFT 5TH RAY;  Surgeon: [name], MD;  Location: [facility];  Service: Orthopedics;  Laterality: Left;  . BACK SURGERY    . CHOLECYSTECTOMY    . CORONARY ARTERY BYPASS GRAFT     Social History   Occupational History  . Not on file  Tobacco Use  . Smoking status: Former Smoker    Types: Cigars    Last attempt to quit: 12/24/1988    Years since quitting: 29.2  . Smokeless tobacco: Never Used  Substance and Sexual Activity  . Alcohol use: No  . Drug use: No  . Sexual activity: Yes    Partners: Female

## 2018-04-13 ENCOUNTER — Encounter (INDEPENDENT_AMBULATORY_CARE_PROVIDER_SITE_OTHER): Payer: Self-pay | Admitting: Orthopedic Surgery

## 2018-04-13 ENCOUNTER — Ambulatory Visit (INDEPENDENT_AMBULATORY_CARE_PROVIDER_SITE_OTHER): Payer: Managed Care, Other (non HMO) | Admitting: Orthopedic Surgery

## 2018-04-13 VITALS — Ht 70.0 in | Wt 225.0 lb

## 2018-04-13 DIAGNOSIS — L97421 Non-pressure chronic ulcer of left heel and midfoot limited to breakdown of skin: Secondary | ICD-10-CM | POA: Diagnosis not present

## 2018-04-13 DIAGNOSIS — L97511 Non-pressure chronic ulcer of other part of right foot limited to breakdown of skin: Secondary | ICD-10-CM | POA: Diagnosis not present

## 2018-04-13 NOTE — Progress Notes (Signed)
Office Visit Note   Patient: [name]           Date of Birth: 11/17/1950           MRN: [id number] Visit Date: 04/13/2018              Requested by: Fanny Bien, [address] [place], [place] [address] PCP: Fanny Bien, MD  Chief Complaint  Patient presents with  . Right Foot - Follow-up  . Left Foot - Follow-up      HPI: Patient is a 68 year old gentleman who presents in follow-up for Wagoner grade 1 ulcer is base of the fifth metatarsal both feet.  Patient is currently wearing a double upright brace has custom orthotics extra-depth shoes he states that shoes are fitting well.  Assessment & Plan: Visit Diagnoses:  1. Non-pressure chronic ulcer of other part of right foot limited to breakdown of skin ([place])   2. Midfoot ulcer, left, limited to breakdown of skin ([place])     Plan: Continue with current care and reevaluate in 4 weeks.  Follow-Up Instructions: Return in about 4 weeks (around 05/11/2018).   Ortho Exam  Patient is alert, oriented, no adenopathy, well-dressed, normal affect, normal respiratory effort. Examination patient does have venous stasis changes but no venous ulcers.  He has a Medical illustrator grade 1 ulcer beneath the base of the fifth metatarsal bilaterally.  After informed consent a 10 blade knife was used to debride skin and soft tissue from the wounds bilaterally.  The base of the fifth metatarsal left foot is 1 cm diameter 2 mm deep the base of the fifth metatarsal right foot ulcer is 2 cm in diameter 2 mm deep these both had good granulation tissue after debridement there is no exposed bone no tendon no signs of infection a Band-Aid was applied.  Imaging: No results found. No images are attached to the encounter.  Labs: Lab Results  Component Value Date   HGBA1C 6.0 (H) 12/24/2015   HGBA1C 7.0 (H) 12/25/2013   HGBA1C 10.0 (H) 09/15/2012   ESRSEDRATE 16 02/06/2018   ESRSEDRATE 53 (H) 09/15/2012   REPTSTATUS 12/29/2015 FINAL 12/24/2015    CULT NO GROWTH 5 DAYS 12/24/2015     Lab Results  Component Value Date   ALBUMIN 3.7 12/24/2015   ALBUMIN 3.7 12/28/2014   ALBUMIN 3.2 (L) 09/15/2012    Body mass index is 32.28 kg/m.  Orders:  No orders of the defined types were placed in this encounter.  No orders of the defined types were placed in this encounter.    Procedures: No procedures performed  Clinical Data: No additional findings.  ROS:  All other systems negative, except as noted in the HPI. Review of Systems  Objective: Vital Signs: Ht 5\' 10"  (1.778 m)   Wt 225 lb (102.1 kg)   BMI 32.28 kg/m   Specialty Comments:  No specialty comments available.  PMFS History: Patient Active Problem List   Diagnosis Date Noted  . Diabetic neuropathy ([place]) 02/06/2018  . Cervical myelopathy ([place]) 02/06/2018  . Onychomycosis 10/30/2017  . Other spondylosis with radiculopathy, cervical region 01/27/2017  . Midfoot ulcer, left, limited to breakdown of skin ([place]) 11/15/2016  . Lateral epicondylitis, left elbow 08/12/2016  . Cellulitis of fifth toe of right foot 07/29/2016  . Prostate cancer ([place]) 06/19/2016  . Non-pressure chronic ulcer of other part of right foot limited to breakdown of skin ([place]) 04/04/2016  . Cellulitis of leg, right 12/24/2015  .  Acute kidney injury superimposed on CKD ([place]) 12/24/2015  . Cellulitis of right lower extremity 12/24/2015  . CKD (chronic kidney disease), stage III ([place]) 12/25/2013  . Foot infection 12/24/2013  . Foot ulcer, left ([place]) 12/24/2013  . Cellulitis 09/14/2012  . Diabetic foot ulcer ([place]) 09/14/2012  . Gout 09/14/2012  . HTN (hypertension) 09/14/2012  . Hyperlipidemia 09/14/2012  . Hypothyroidism 09/14/2012  . CAD (coronary artery disease) 09/14/2012  . Diabetes mellitus ([place]) 09/14/2012  . Hypercholesteremia   . Neuropathy [facility])    Past Medical History:  Diagnosis Date  . Cervical myelopathy ([place]) 02/06/2018  . Coronary artery disease   . Diabetes  mellitus without complication ([place])   . Diabetic neuropathy ([place]) 02/06/2018  . Hypercholesteremia   . Hypertension   . Neuropathy   . Prostate cancer [facility])     Family History  Problem Relation Age of Onset  . Diabetes Mellitus II Mother   . Diabetes Mellitus II Father   . CAD Father   . Cancer Father        prostate  . Diabetes Mellitus II Brother   . Diabetes Mellitus II Brother   . Cancer Brother     Past Surgical History:  Procedure Laterality Date  . AMPUTATION Left 12/25/2013   Procedure: AMPUTATION RAY LEFT 5TH RAY;  Surgeon: [name], MD;  Location: [facility];  Service: Orthopedics;  Laterality: Left;  . BACK SURGERY    . CHOLECYSTECTOMY    . CORONARY ARTERY BYPASS GRAFT     Social History   Occupational History  . Not on file  Tobacco Use  . Smoking status: Former Smoker    Types: Cigars    Last attempt to quit: 12/24/1988    Years since quitting: 29.3  . Smokeless tobacco: Never Used  Substance and Sexual Activity  . Alcohol use: No  . Drug use: No  . Sexual activity: Yes    Partners: Female

## 2018-04-18 ENCOUNTER — Other Ambulatory Visit: Payer: Self-pay | Admitting: Neurology

## 2018-05-11 ENCOUNTER — Encounter (INDEPENDENT_AMBULATORY_CARE_PROVIDER_SITE_OTHER): Payer: Self-pay | Admitting: Orthopedic Surgery

## 2018-05-11 ENCOUNTER — Ambulatory Visit (INDEPENDENT_AMBULATORY_CARE_PROVIDER_SITE_OTHER): Payer: Managed Care, Other (non HMO) | Admitting: Orthopedic Surgery

## 2018-05-11 VITALS — Ht 70.0 in | Wt 225.0 lb

## 2018-05-11 DIAGNOSIS — L97511 Non-pressure chronic ulcer of other part of right foot limited to breakdown of skin: Secondary | ICD-10-CM | POA: Diagnosis not present

## 2018-05-11 DIAGNOSIS — L97421 Non-pressure chronic ulcer of left heel and midfoot limited to breakdown of skin: Secondary | ICD-10-CM

## 2018-05-11 NOTE — Progress Notes (Signed)
Office Visit Note   Patient: [name]           Date of Birth: October 04, 1950           MRN: [id number] Visit Date: 05/11/2018              Requested by: Fanny Bien, [address] [place], [place] [address] PCP: Fanny Bien, MD  Chief Complaint  Patient presents with  . Left Foot - Follow-up  . Right Foot - Follow-up      HPI: Patient is a 68 year old gentleman with diabetic insensate neuropathy with Wagner grade 1 ulcers beneath the base of the fifth metatarsals bilaterally with a double upright brace on the left.  Patient states he feels like the ulcers are getting better.  Assessment & Plan: Visit Diagnoses:  1. Non-pressure chronic ulcer of other part of right foot limited to breakdown of skin ([place])   2. Midfoot ulcer, left, limited to breakdown of skin ([place])     Plan: Ulcers were debrided of skin and soft tissue x2 continue with protective shoe wear.  Follow-Up Instructions: Return in about 4 weeks (around 06/08/2018).   Ortho Exam  Patient is alert, oriented, no adenopathy, well-dressed, normal affect, normal respiratory effort. Examination patient's feet are plantigrade bilaterally.  He has a Medical illustrator grade 1 ulcer beneath the base of the fifth metatarsals bilaterally.  After informed consent a 10 blade knife was used to debride the skin and soft tissue back to healthy viable bleeding petechial tissue along the ulcers bilaterally.  Single nitrate was used for hemostasis Band-Aids were applied.  The right fifth metatarsal ulcer is 10 mm in diameter 2 mm deep the left ulcer is 10 mm in diameter 1 mm deep.  Imaging: No results found. No images are attached to the encounter.  Labs: Lab Results  Component Value Date   HGBA1C 6.0 (H) 12/24/2015   HGBA1C 7.0 (H) 12/25/2013   HGBA1C 10.0 (H) 09/15/2012   ESRSEDRATE 16 02/06/2018   ESRSEDRATE 53 (H) 09/15/2012   REPTSTATUS 12/29/2015 FINAL 12/24/2015   CULT NO GROWTH 5 DAYS 12/24/2015     Lab  Results  Component Value Date   ALBUMIN 3.7 12/24/2015   ALBUMIN 3.7 12/28/2014   ALBUMIN 3.2 (L) 09/15/2012    Body mass index is 32.28 kg/m.  Orders:  No orders of the defined types were placed in this encounter.  No orders of the defined types were placed in this encounter.    Procedures: No procedures performed  Clinical Data: No additional findings.  ROS:  All other systems negative, except as noted in the HPI. Review of Systems  Objective: Vital Signs: Ht 5\' 10"  (1.778 m)   Wt 225 lb (102.1 kg)   BMI 32.28 kg/m   Specialty Comments:  No specialty comments available.  PMFS History: Patient Active Problem List   Diagnosis Date Noted  . Diabetic neuropathy ([place]) 02/06/2018  . Cervical myelopathy ([place]) 02/06/2018  . Onychomycosis 10/30/2017  . Other spondylosis with radiculopathy, cervical region 01/27/2017  . Midfoot ulcer, left, limited to breakdown of skin ([place]) 11/15/2016  . Lateral epicondylitis, left elbow 08/12/2016  . Cellulitis of fifth toe of right foot 07/29/2016  . Prostate cancer ([place]) 06/19/2016  . Non-pressure chronic ulcer of other part of right foot limited to breakdown of skin ([place]) 04/04/2016  . Cellulitis of leg, right 12/24/2015  . Acute kidney injury superimposed on CKD ([place]) 12/24/2015  . Cellulitis of right lower  extremity 12/24/2015  . CKD (chronic kidney disease), stage III ([place]) 12/25/2013  . Foot infection 12/24/2013  . Foot ulcer, left ([place]) 12/24/2013  . Cellulitis 09/14/2012  . Diabetic foot ulcer ([place]) 09/14/2012  . Gout 09/14/2012  . HTN (hypertension) 09/14/2012  . Hyperlipidemia 09/14/2012  . Hypothyroidism 09/14/2012  . CAD (coronary artery disease) 09/14/2012  . Diabetes mellitus ([place]) 09/14/2012  . Hypercholesteremia   . Neuropathy [facility])    Past Medical History:  Diagnosis Date  . Cervical myelopathy ([place]) 02/06/2018  . Coronary artery disease   . Diabetes mellitus without complication ([place])   . Diabetic  neuropathy ([place]) 02/06/2018  . Hypercholesteremia   . Hypertension   . Neuropathy   . Prostate cancer [facility])     Family History  Problem Relation Age of Onset  . Diabetes Mellitus II Mother   . Diabetes Mellitus II Father   . CAD Father   . Cancer Father        prostate  . Diabetes Mellitus II Brother   . Diabetes Mellitus II Brother   . Cancer Brother     Past Surgical History:  Procedure Laterality Date  . AMPUTATION Left 12/25/2013   Procedure: AMPUTATION RAY LEFT 5TH RAY;  Surgeon: [name], MD;  Location: [facility];  Service: Orthopedics;  Laterality: Left;  . BACK SURGERY    . CHOLECYSTECTOMY    . CORONARY ARTERY BYPASS GRAFT     Social History   Occupational History  . Not on file  Tobacco Use  . Smoking status: Former Smoker    Types: Cigars    Last attempt to quit: 12/24/1988    Years since quitting: 29.3  . Smokeless tobacco: Never Used  Substance and Sexual Activity  . Alcohol use: No  . Drug use: No  . Sexual activity: Yes    Partners: Female

## 2018-05-15 NOTE — Progress Notes (Signed)
Subjective:   @Patient  ID: [name], male    DOB: 1950/06/19, 68 y.o.   MRN: [id number]  No chief complaint on file.   HPI: [name]  is a 68 y.o. male  with [name] is a fairly complex Caucasian male with history of known coronary artery disease and has undergone CABG in 2005, who has diabetes mellitus, diabetic nephropathy. stage 3-4 CKD, peripheral neuropathy and diabetic foot ulcer right. DM is controlled. He also has chronic bilateral leg edema and venous insufficiency of left leg by insufficiency study in Sept 2019. He has no known PAD and Lower extremity arterial duplex was normal and he has good pulses on physical exam.  He has had amputation of his left 2 toes due to diabetic foot ulcer in the past. He now has diabetic bilateral foot ulcers in the sole of the foot and follows Dr. [name]. He has not had any recurrence of chest pain and remains angina free. No shortness breath, no PND or orthopnea. He has been exercising regularly but mostly lifting weight. Underwent cervical spine surgery in Nov 2018 without cardiac complications and has done well with mild residual bilateral arm pain.  Due to peripheral neuropathy he is now on duloxetine and is doing well.   Past Medical History:  Diagnosis Date  . Cervical myelopathy ([place]) 02/06/2018  . Coronary artery disease   . Diabetes mellitus without complication ([place])   . Diabetic neuropathy ([place]) 02/06/2018  . Hypercholesteremia   . Hypertension   . Neuropathy   . Prostate cancer [facility])     Past Surgical History:  Procedure Laterality Date  . AMPUTATION Left 12/25/2013   Procedure: AMPUTATION RAY LEFT 5TH RAY;  Surgeon: [name], MD;  Location: [facility];  Service: Orthopedics;  Laterality: Left;  . BACK SURGERY    . CHOLECYSTECTOMY    . CORONARY ARTERY BYPASS GRAFT      Social History   Socioeconomic History  . Marital status: Married    Spouse name: Not on file  . Number of children: Not on file  . Years of  education: Not on file  . Highest education level: Not on file  Occupational History  . Not on file  Social Needs  . Financial resource strain: Not on file  . Food insecurity:    Worry: Not on file    Inability: Not on file  . Transportation needs:    Medical: Not on file    Non-medical: Not on file  Tobacco Use  . Smoking status: Former Smoker    Types: Cigars    Last attempt to quit: 12/24/1988    Years since quitting: 29.4  . Smokeless tobacco: Never Used  Substance and Sexual Activity  . Alcohol use: No  . Drug use: No  . Sexual activity: Yes    Partners: Female  Lifestyle  . Physical activity:    Days per week: Not on file    Minutes per session: Not on file  . Stress: Not on file  Relationships  . Social connections:    Talks on phone: Not on file    Gets together: Not on file    Attends religious service: Not on file    Active member of club or organization: Not on file    Attends meetings of clubs or organizations: Not on file    Relationship status: Not on file  . Intimate partner violence:    Fear of current or ex partner: Not on file  Emotionally abused: Not on file    Physically abused: Not on file    Forced sexual activity: Not on file  Other Topics Concern  . Not on file  Social History Narrative   Regular exercise: yes 3 times a week   Caffeine use: hot tea    Current Outpatient Medications on File Prior to Visit  Medication Sig Dispense Refill  . aspirin EC 81 MG tablet Take 81 mg by mouth at bedtime.     . carvedilol (COREG) 25 MG tablet     . diclofenac sodium (VOLTAREN) 1 % GEL Apply 1 application topically daily.    . DULoxetine (CYMBALTA) 30 MG capsule TAKE 1 CAPSULE (30 MG TOTAL) BY MOUTH 2 (TWO) TIMES DAILY. 180 capsule 2  . Esomeprazole Magnesium (NEXIUM PO) Take 22.3 mg by mouth daily.    Marland Kitchen ezetimibe (ZETIA) 10 MG tablet Take 10 mg by mouth daily.    Marland Kitchen guaiFENesin-codeine 100-10 MG/5ML syrup Take 5-10 mLs by mouth every 4 (four) hours  as needed for cough. 236 mL 0  . Insulin Degludec (TRESIBA FLEXTOUCH) 200 UNIT/ML SOPN Inject 40 Units into the skin at bedtime. (Patient taking differently: Inject 30 Units into the skin at bedtime. )    . levothyroxine (SYNTHROID, LEVOTHROID) 112 MCG tablet Take 112 mcg by mouth daily before breakfast.    . LYRICA 150 MG capsule Take 1 capsule (150 mg total) by mouth at bedtime.    Marland Kitchen OVER THE COUNTER MEDICATION Take 1 tablet by mouth See admin instructions. Take 1 tablet by mouth every 2 hours during the night as needed for leg cramps: [place]yland's Leg Cramps PM    . pregabalin (LYRICA) 75 MG capsule Take 2 capsules (150 mg total) by mouth 2 (two) times daily.    . rosuvastatin (CRESTOR) 10 MG tablet Take 10 mg by mouth daily.    . Semaglutide (OZEMPIC, 0.25 OR 0.5 MG/DOSE, [place]) Inject into the skin once a week.    . sildenafil (VIAGRA) 25 MG tablet Take 25 mg by mouth daily as needed for erectile dysfunction.    . tadalafil (CIALIS) 5 MG tablet Take 5 mg by mouth daily as needed for erectile dysfunction.    . tamsulosin (FLOMAX) 0.4 MG CAPS capsule Take 1 capsule (0.4 mg total) by mouth daily after supper. 30 capsule 5  . triamcinolone (KENALOG) 0.025 % cream Apply 1 application topically daily as needed (psoriasis).     No current facility-administered medications on file prior to visit.      Review of Systems  Constitutional: Negative for malaise/fatigue and weight loss.  Respiratory: Negative for cough, hemoptysis and shortness of breath.   Cardiovascular: Positive for leg swelling (chronic). Negative for chest pain, palpitations and claudication.  Gastrointestinal: Negative for abdominal pain, blood in stool, constipation, heartburn and vomiting.  Genitourinary: Negative for dysuria.  Musculoskeletal: Positive for joint pain (knee and shoulders) and myalgias (muscle cramps at night).  Neurological: Negative for dizziness, focal weakness and headaches.  Endo/Heme/Allergies: Does not  bruise/bleed easily.  Psychiatric/Behavioral: Negative for depression. The patient is not nervous/anxious.   All other systems reviewed and are negative.      Objective:  There were no vitals taken for this visit.  Physical Exam  Constitutional: He appears well-developed. No distress.  Mildly obese  HENT:  Head: Atraumatic.  Eyes: Conjunctivae are normal.  Neck: Neck supple. No JVD present. No thyromegaly present.  Cardiovascular: Normal rate and regular rhythm. Exam reveals no gallop.  No murmur (  2/6 early systolic murmur at the apex) heard. Pulses:      Carotid pulses are 2+ on the right side with bruit and 2+ on the left side with bruit.      Femoral pulses are 2+ on the right side and 2+ on the left side.      Popliteal pulses are 0 on the right side and 0 on the left side.       Dorsalis pedis pulses are 2+ on the right side and 2+ on the left side.       Posterior tibial pulses are 0 on the right side and 0 on the left side.  Has ulcers on both feet at the lateral aspect, healing and followed by Dr. [name]. Thick rigid nails and onychomycosis (chronic).   Pulmonary/Chest: Effort normal and breath sounds normal.  Abdominal: Soft. Bowel sounds are normal.  Musculoskeletal: Normal range of motion.        General: No edema.  Neurological: He is alert.  Skin: Skin is warm and dry.  Psychiatric: He has a normal mood and affect.    CARDIAC STUDIES:    Echo- 06/08/13 1. Left ventricular cavity is normal in size. Mild concentric hypertrophy. Normal global wall motion. Normal systolic global function. Calculated EF 57%. Visual EF is 60-65%. 2. Left atrial cavity is mildly dilated. Mild aneurysmal motion of the interatrial septum. 3. Mild aortic valve leaflet calcification. 4. Mild calcification of the mitral annulus. Mild mitral regurgitation. 5. Tricuspid valve structurally normal. Mild tricuspid regurgitation.   Lexiscan sestamibi stress test 05/28/2013: 1. Resting EKG NSR,  Poor R wave progression. Low voltage complexes. Stress EKG was non diagnostic for ischemia. No ST-T changes of ischemia noted with pharmacologic stress testing.  2. The perfusion study demonstrated normal isotope uptake both at rest and stress. There was no evidence of ischemia or scar. Dynamic gated images reveal normal wall motion and endocardial thickening. Left ventricular ejection fraction was estimated to be 67%.  Lower extremity arterial duplex 05/12/2014:  No hemodynamically significant stenoses are identified in the bilateral lower extremity arterial system.This exam reveals normal perfusion of both the lower extremities with RABI 1.04 and LABI 1.09. There is mild diffuse disease involving the small vessels below the knee. Compared to the study done on 06/08/2013, no significant change. Impression: Lower extremity venous insufficiency study 12/25/2016: No venous insufficiency or DVT in the right lower extremity. Reflexes in the left greater saphenous vein beginning in the distal thigh and involving the entire length of the calf. No obvious varicosities.  Carotid artery duplex 01/27/2018: Stenosis in the bilateral internal carotid artery (16-49%), lower end of spectrum with heteregenous plaque. Right external carotid stenosis <50%. Antegrade right vertebral artery flow. Antegrade left vertebral artery flow. Follow up in one year is appropriate if clinically indicated. Compared to 12/04/2016, no significant change.    Assessment & Recommendations:   1. Coronary artery disease involving native coronary artery of native heart without angina pectoris [place] 02/18/2004: RIMA to LAD, LIMA to obtuse marginal, SVG to diagonal and SVG to PDA. EKG 05/18/2018: Sinus rhythm with first-degree AV block at the rate of 81 bpm, normal axis, incomplete right bundle branch block.  Nonspecific T abnormality. No significant change from EKG 05/19/2017.   2. Essential hypertension Controlled  3. Diabetic  polyneuropathy associated with type 2 diabetes mellitus ([employer]) Also has diabetic foot ulcer  4. CKD (chronic kidney disease), stage III ([place]) Stable and follows [facility] Dr. Hollie Salk.  5. Hypercholesteremia On Zetia  and Crestor and controlled per patient  6. Chronic venous insufficiency left leg, no le edema    7. Asymptomatic mild bilateral carotid stenosis of <50%.  Recommendation:  He is presently doing well and from cardiac standpoint no change in the EKG, has underlying first-degree AV block, blood pressure is well controlled and is on appropriate medical therapy.  Renal function is remained stable, diabetes is now much improved.  Chronic ulceration in his feet is also improved and stable.  Advised him that if ulcers to get worse he needs to contact us immediately.  I'll see him back on an annual basis.  Adrian Prows, MD, [facility] 05/15/2018, 3:32 PM [facility] [place] Pager: [phone] Office: [phone] If no answer Cell [phone]

## 2018-05-18 ENCOUNTER — Encounter: Payer: Self-pay | Admitting: Cardiology

## 2018-05-18 ENCOUNTER — Ambulatory Visit: Payer: Managed Care, Other (non HMO) | Admitting: Cardiology

## 2018-05-18 VITALS — BP 101/59 | HR 84 | Ht 70.0 in | Wt 225.0 lb

## 2018-05-18 DIAGNOSIS — E1142 Type 2 diabetes mellitus with diabetic polyneuropathy: Secondary | ICD-10-CM | POA: Diagnosis not present

## 2018-05-18 DIAGNOSIS — I10 Essential (primary) hypertension: Secondary | ICD-10-CM

## 2018-05-18 DIAGNOSIS — I129 Hypertensive chronic kidney disease with stage 1 through stage 4 chronic kidney disease, or unspecified chronic kidney disease: Secondary | ICD-10-CM

## 2018-05-18 DIAGNOSIS — N183 Chronic kidney disease, stage 3 unspecified: Secondary | ICD-10-CM

## 2018-05-18 DIAGNOSIS — I2510 Atherosclerotic heart disease of native coronary artery without angina pectoris: Secondary | ICD-10-CM

## 2018-05-18 DIAGNOSIS — E7800 Pure hypercholesterolemia, unspecified: Secondary | ICD-10-CM

## 2018-06-08 ENCOUNTER — Ambulatory Visit (INDEPENDENT_AMBULATORY_CARE_PROVIDER_SITE_OTHER): Payer: Managed Care, Other (non HMO) | Admitting: Orthopedic Surgery

## 2018-06-08 ENCOUNTER — Encounter (INDEPENDENT_AMBULATORY_CARE_PROVIDER_SITE_OTHER): Payer: Self-pay | Admitting: Orthopedic Surgery

## 2018-06-08 VITALS — Ht 70.0 in | Wt 225.0 lb

## 2018-06-08 DIAGNOSIS — L97421 Non-pressure chronic ulcer of left heel and midfoot limited to breakdown of skin: Secondary | ICD-10-CM | POA: Diagnosis not present

## 2018-06-08 DIAGNOSIS — L97511 Non-pressure chronic ulcer of other part of right foot limited to breakdown of skin: Secondary | ICD-10-CM | POA: Diagnosis not present

## 2018-06-08 NOTE — Progress Notes (Signed)
Office Visit Note   Patient: [name]           Date of Birth: [birth year]           MRN: [id number] Visit Date: 06/08/2018              Requested by: Fanny Bien, [address] [place], [place] [address] PCP: Fanny Bien, MD  Chief Complaint  Patient presents with  . Left Foot - Follow-up  . Right Foot - Follow-up       HPI: Patient is seen in follow-up for bilateral lower extremities.  Patient states he needs new orthotics from [employer].  Assessment & Plan: Visit Diagnoses:  1. Non-pressure chronic ulcer of other part of right foot limited to breakdown of skin ([address])   2. Midfoot ulcer, left, limited to breakdown of skin [facility])     Plan: Patient is given a prescription for [employer] for new custom orthotics.  Patient is status post a left fifth ray amputation and requires the orthotic to prevent further ulceration or further amputation.  Patient is given a prescription for handicap parking.  Follow-Up Instructions: Return in about 4 weeks (around 07/06/2018).   Ortho Exam  Patient is alert, oriented, no adenopathy, well-dressed, normal affect, normal respiratory effort. Examination patient has palpable pulses.  He has a callus beneath the base of the fifth metatarsal on the left foot but no open wound.  Patient does have an open Wagner grade 1 ulcer beneath the base of the fifth metatarsal on the right foot.  He states he has had some bleeding and pain denies any cellulitis denies any odor.  Patient has palpable pulses.  The callus was pared on the left foot.  On the right foot the skin and soft tissue was debrided with a 10 blade knife.  The wound was 10 mm in diameter and 2 mm deep after debridement.  This was touched with silver nitrate Iodosorb and a Band-Aid was applied.  There is no exposed bone or tendon.  Imaging: No results found. No images are attached to the encounter.  Labs: Lab Results  Component Value Date   HGBA1C 6.0 (H) 12/24/2015   HGBA1C 7.0 (H) 12/25/2013   HGBA1C 10.0 (H) 09/15/2012   ESRSEDRATE 16 02/06/2018   ESRSEDRATE 53 (H) 09/15/2012   REPTSTATUS 12/29/2015 FINAL 12/24/2015   CULT NO GROWTH 5 DAYS 12/24/2015     Lab Results  Component Value Date   ALBUMIN 3.7 12/24/2015   ALBUMIN 3.7 12/28/2014   ALBUMIN 3.2 (L) 09/15/2012    Body mass index is 32.28 kg/m.  Orders:  No orders of the defined types were placed in this encounter.  No orders of the defined types were placed in this encounter.    Procedures: No procedures performed  Clinical Data: No additional findings.  ROS:  All other systems negative, except as noted in the HPI. Review of Systems  Objective: Vital Signs: Ht 5\' 10"  (1.778 m)   Wt 225 lb (102.1 kg)   BMI 32.28 kg/m   Specialty Comments:  No specialty comments available.  PMFS History: Patient Active Problem List   Diagnosis Date Noted  . Diabetic neuropathy ([place]) 02/06/2018  . Cervical myelopathy ([place]) 02/06/2018  . Onychomycosis 10/30/2017  . Other spondylosis with radiculopathy, cervical region 01/27/2017  . Midfoot ulcer, left, limited to breakdown of skin ([place]) 11/15/2016  . Lateral epicondylitis, left elbow 08/12/2016  . Cellulitis of fifth toe of right foot 07/29/2016  .  Prostate cancer ([place]) 06/19/2016  . Non-pressure chronic ulcer of other part of right foot limited to breakdown of skin ([place]) 04/04/2016  . Cellulitis of leg, right 12/24/2015  . Cellulitis of right lower extremity 12/24/2015  . CKD (chronic kidney disease), stage III ([name]) 12/25/2013  . Foot infection 12/24/2013  . Foot ulcer, left ([place]) 12/24/2013  . Diabetic foot ulcer ([place]) 09/14/2012  . Gout 09/14/2012  . HTN (hypertension) 09/14/2012  . Hypothyroidism 09/14/2012  . CAD (coronary artery disease) 09/14/2012  . Diabetes mellitus ([place]) 09/14/2012  . Hypercholesteremia   . Neuropathy [facility])    Past Medical History:  Diagnosis Date  . Cervical myelopathy ([place]) 02/06/2018  .  Coronary artery disease   . Diabetes mellitus without complication ([place])   . Diabetic neuropathy ([place]) 02/06/2018  . Hypercholesteremia   . Hypertension   . Neuropathy   . Prostate cancer [facility])     Family History  Problem Relation Age of Onset  . Diabetes Mellitus II Mother   . Diabetes Mellitus II Father   . CAD Father   . Cancer Father        prostate  . Diabetes Mellitus II Brother   . Diabetes Mellitus II Brother   . Cancer Brother     Past Surgical History:  Procedure Laterality Date  . AMPUTATION Left 12/25/2013   Procedure: AMPUTATION RAY LEFT 5TH RAY;  Surgeon: [name], MD;  [facility]: [facility];  Service: Orthopedics;  Laterality: Left;  . BACK SURGERY    . CHOLECYSTECTOMY    . CORONARY ARTERY BYPASS GRAFT    . NECK SURGERY     novemver 2019   Social History   Occupational History  . Not on file  Tobacco Use  . Smoking status: Former Smoker    Types: Cigars    Last attempt to quit: 12/24/1988    Years since quitting: 29.4  . Smokeless tobacco: Never Used  Substance and Sexual Activity  . Alcohol use: No  . Drug use: No  . Sexual activity: Yes    Partners: Female

## 2018-07-09 ENCOUNTER — Telehealth (INDEPENDENT_AMBULATORY_CARE_PROVIDER_SITE_OTHER): Payer: Self-pay | Admitting: Radiology

## 2018-07-09 NOTE — Telephone Encounter (Signed)
Called and spoke with patient, patient wanted to reschedule 4/6 appointment due to COVID-19. He wanted to push it out at least a month, rescheduled him for 5/4

## 2018-07-09 NOTE — Telephone Encounter (Signed)
ERROR

## 2018-07-13 ENCOUNTER — Ambulatory Visit (INDEPENDENT_AMBULATORY_CARE_PROVIDER_SITE_OTHER): Payer: Managed Care, Other (non HMO) | Admitting: Orthopedic Surgery

## 2018-08-04 ENCOUNTER — Other Ambulatory Visit: Payer: Self-pay | Admitting: Cardiology

## 2018-08-04 DIAGNOSIS — R0989 Other specified symptoms and signs involving the circulatory and respiratory systems: Secondary | ICD-10-CM

## 2018-08-10 ENCOUNTER — Encounter: Payer: Self-pay | Admitting: Orthopedic Surgery

## 2018-08-10 ENCOUNTER — Ambulatory Visit (INDEPENDENT_AMBULATORY_CARE_PROVIDER_SITE_OTHER): Payer: Managed Care, Other (non HMO) | Admitting: Orthopedic Surgery

## 2018-08-10 ENCOUNTER — Ambulatory Visit (INDEPENDENT_AMBULATORY_CARE_PROVIDER_SITE_OTHER): Payer: Self-pay | Admitting: Orthopedic Surgery

## 2018-08-10 ENCOUNTER — Ambulatory Visit: Payer: Managed Care, Other (non HMO) | Admitting: Orthopedic Surgery

## 2018-08-10 ENCOUNTER — Other Ambulatory Visit: Payer: Self-pay

## 2018-08-10 VITALS — Ht 70.0 in | Wt 225.0 lb

## 2018-08-10 DIAGNOSIS — L97421 Non-pressure chronic ulcer of left heel and midfoot limited to breakdown of skin: Secondary | ICD-10-CM

## 2018-08-10 DIAGNOSIS — L97511 Non-pressure chronic ulcer of other part of right foot limited to breakdown of skin: Secondary | ICD-10-CM | POA: Diagnosis not present

## 2018-08-10 DIAGNOSIS — B351 Tinea unguium: Secondary | ICD-10-CM | POA: Diagnosis not present

## 2018-08-10 NOTE — Progress Notes (Signed)
Office Visit Note   Patient: [name]           Date of Birth: 1950/11/21           MRN: [id number] Visit Date: 08/10/2018              Requested by: Fanny Bien, [address] [place], [place] [address] PCP: Fanny Bien, MD  Chief Complaint  Patient presents with  . Left Foot - Follow-up  . Right Foot - Follow-up      HPI: Patient is a 68 year old gentleman who presents in follow-up for both lower extremities with chronic Wegner grade 1 ulcer beneath the base of the fifth metatarsal bilaterally.  Patient also complains of thickened discolored onychomycotic nails.  He is wearing a posterior AFO on the left he has custom orthotics extra-depth shoes.  Assessment & Plan: Visit Diagnoses:  1. Non-pressure chronic ulcer of other part of right foot limited to breakdown of skin ([place])   2. Midfoot ulcer, left, limited to breakdown of skin ([place])   3. Onychomycosis     Plan: Patient will continue with routine wound care follow-up in 3 months.  Follow-Up Instructions: Return in about 3 months (around 11/10/2018).   Ortho Exam  Patient is alert, oriented, no adenopathy, well-dressed, normal affect, normal respiratory effort. Examination patient has a good dorsalis pedis pulse bilaterally he does have venous stasis changes but no ulcers.  The ulcers beneath the base of the fifth metatarsal bilaterally are healing quite nicely.  There is epithelialization of the ulcer on the left foot right foot has a very small open wound that is about 3 x 5 mm.  There is no redness no cellulitis no signs of infection the skin is thin and atrophic.  Examination of his nails he has thickened discolored onychomycotic nails x9 these were trimmed x9 without complications.  Imaging: No results found. No images are attached to the encounter.  Labs: Lab Results  Component Value Date   HGBA1C 6.0 (H) 12/24/2015   HGBA1C 7.0 (H) 12/25/2013   HGBA1C 10.0 (H) 09/15/2012   ESRSEDRATE 16  02/06/2018   ESRSEDRATE 53 (H) 09/15/2012   REPTSTATUS 12/29/2015 FINAL 12/24/2015   CULT NO GROWTH 5 DAYS 12/24/2015     Lab Results  Component Value Date   ALBUMIN 3.7 12/24/2015   ALBUMIN 3.7 12/28/2014   ALBUMIN 3.2 (L) 09/15/2012    Body mass index is 32.28 kg/m.  Orders:  No orders of the defined types were placed in this encounter.  No orders of the defined types were placed in this encounter.    Procedures: No procedures performed  Clinical Data: No additional findings.  ROS:  All other systems negative, except as noted in the HPI. Review of Systems  Objective: Vital Signs: Ht 5\' 10"  (1.778 m)   Wt 225 lb (102.1 kg)   BMI 32.28 kg/m   Specialty Comments:  No specialty comments available.  PMFS History: Patient Active Problem List   Diagnosis Date Noted  . Diabetic neuropathy ([place]) 02/06/2018  . Cervical myelopathy ([place]) 02/06/2018  . Onychomycosis 10/30/2017  . Other spondylosis with radiculopathy, cervical region 01/27/2017  . Midfoot ulcer, left, limited to breakdown of skin ([place]) 11/15/2016  . Lateral epicondylitis, left elbow 08/12/2016  . Cellulitis of fifth toe of right foot 07/29/2016  . Prostate cancer ([place]) 06/19/2016  . Non-pressure chronic ulcer of other part of right foot limited to breakdown of skin ([place]) 04/04/2016  .  Cellulitis of leg, right 12/24/2015  . Cellulitis of right lower extremity 12/24/2015  . CKD (chronic kidney disease), stage III ([place]) 12/25/2013  . Foot infection 12/24/2013  . Foot ulcer, left ([place]) 12/24/2013  . Diabetic foot ulcer ([place]) 09/14/2012  . Gout 09/14/2012  . HTN (hypertension) 09/14/2012  . Hypothyroidism 09/14/2012  . CAD (coronary artery disease) 09/14/2012  . Diabetes mellitus ([place]) 09/14/2012  . Hypercholesteremia   . Neuropathy [facility])    Past Medical History:  Diagnosis Date  . Cervical myelopathy ([place]) 02/06/2018  . Coronary artery disease   . Diabetes mellitus without complication ([place])    . Diabetic neuropathy ([place]) 02/06/2018  . Hypercholesteremia   . Hypertension   . Neuropathy   . Prostate cancer [facility])     Family History  Problem Relation Age of Onset  . Diabetes Mellitus II Mother   . Diabetes Mellitus II Father   . CAD Father   . Cancer Father        prostate  . Diabetes Mellitus II Brother   . Diabetes Mellitus II Brother   . Cancer Brother     Past Surgical History:  Procedure Laterality Date  . AMPUTATION Left 12/25/2013   Procedure: AMPUTATION RAY LEFT 5TH RAY;  Surgeon: [name], MD;  Location: [facility];  Service: Orthopedics;  Laterality: Left;  . BACK SURGERY    . CHOLECYSTECTOMY    . CORONARY ARTERY BYPASS GRAFT    . NECK SURGERY     novemver 2019   Social History   Occupational History  . Not on file  Tobacco Use  . Smoking status: Former Smoker    Types: Cigars    Last attempt to quit: 12/24/1988    Years since quitting: 29.6  . Smokeless tobacco: Never Used  Substance and Sexual Activity  . Alcohol use: No  . Drug use: No  . Sexual activity: Yes    Partners: Female

## 2018-08-18 ENCOUNTER — Telehealth: Payer: Self-pay | Admitting: Neurology

## 2018-08-18 NOTE — Telephone Encounter (Signed)
Due to current COVID 19 pandemic, our office is severely reducing in office visits until further notice, in order to minimize the risk to our patients and healthcare providers.   Called patient to offer a virtual visit for his 5/18 appointment. Patient accepted and verbalized understanding of the doxy.me process. I have sent an e-mail to patient with the link and directions as well as my name and office number/hours for reference. Patient understands that RN will call him to update chart.  Pt understands that although there may be some limitations with this type of visit, we will take all precautions to reduce any security or privacy concerns.  Pt understands that this will be treated like an in office visit and we will file with pt's insurance, and there may be a patient responsible charge related to this service.

## 2018-08-20 NOTE — Telephone Encounter (Signed)
I contacted the pt and updated meds, allergies and pmh for 08/24/18 visit.  Pt states he has not received the link yet so I have resent to [email or web address]. Pt understands to sign in 15 mins prior to our virtual waiting room.

## 2018-08-24 ENCOUNTER — Encounter: Payer: Self-pay | Admitting: Neurology

## 2018-08-24 ENCOUNTER — Other Ambulatory Visit: Payer: Self-pay

## 2018-08-24 ENCOUNTER — Ambulatory Visit (INDEPENDENT_AMBULATORY_CARE_PROVIDER_SITE_OTHER): Payer: Managed Care, Other (non HMO) | Admitting: Neurology

## 2018-08-24 DIAGNOSIS — G959 Disease of spinal cord, unspecified: Secondary | ICD-10-CM | POA: Diagnosis not present

## 2018-08-24 DIAGNOSIS — E1142 Type 2 diabetes mellitus with diabetic polyneuropathy: Secondary | ICD-10-CM

## 2018-08-24 DIAGNOSIS — R269 Unspecified abnormalities of gait and mobility: Secondary | ICD-10-CM | POA: Diagnosis not present

## 2018-08-24 HISTORY — DX: Unspecified abnormalities of gait and mobility: R26.9

## 2018-08-24 MED ORDER — DULOXETINE HCL 60 MG PO CPEP: 60.0000 mg | ORAL_CAPSULE | Freq: Two times a day (BID) | ORAL | 1 refills | Status: DC

## 2018-08-24 NOTE — Progress Notes (Signed)
     Virtual Visit via Video Note  I connected with Bela Nyborg on 08/24/18 at  8:00 AM EDT by a video enabled telemedicine application and verified that I am speaking with the correct person using two identifiers.  Location: Patient: The patient is at home. Provider: Providers in the office.   I discussed the limitations of evaluation and management by telemedicine and the availability of in person appointments. The patient expressed understanding and agreed to proceed.  History of Present Illness: [name] is a 68 year old right-handed white male with a history of diabetes with a diabetic peripheral neuropathy.  The patient has had some foot ulcers that have healed, he indicates that he continues to have pain in the feet, left greater than right.  The pain is actually worse when he is up on his feet, but he still has some burning sensations in the left greater than right foot with rest.  The patient is sleeping fairly well at night, he remains on Lyrica but cannot take higher doses because of chronic renal insufficiency.  He is on the waiting list for a renal transplant.  He reports no falls, he does not use a cane for ambulation.  He does have AFO braces.  He does not have any steps or stairs that he has to go up and down at home.  The patient has gained some benefit from Cymbalta, he currently is on 30 mg twice daily.  The patient has some decreased left arm and hand strength following cervical spine surgery.  He denies any significant pain in the hands.  He is being evaluated for the neuropathy.   Observations/Objective: The examination today reveals the patient is alert and cooperative.  Speech is well enunciated, not aphasic or dysarthric.  Extraocular movements are full.  He is able to protrude the tongue in the midline with good lateral movement of the tongue.  Face is symmetric.  He has good finger-nose-finger bilaterally and good heel shin bilaterally.  Gait is slightly wide-based, he  can walk independently.  He is unable to perform tandem gait.  Romberg is unsteady.  Assessment and Plan: 1.  Peripheral neuropathy, diabetic  2.  Gait disorder  The patient will be increased on the Cymbalta, he will go to 90 mg daily for 1 week and then go to 60 mg twice daily.  He will be given a prescription for the Cymbalta.  He will call for any dose adjustments.  He will remain on the Lyrica.  He will follow-up here in about 6 months.  Follow Up Instructions:    I discussed the assessment and treatment plan with the patient. The patient was provided an opportunity to ask questions and all were answered. The patient agreed with the plan and demonstrated an understanding of the instructions.   The patient was advised to call back or seek an in-person evaluation if the symptoms worsen or if the condition fails to improve as anticipated.  I provided 20 minutes of non-face-to-face time during this encounter.   Kathrynn Ducking, MD

## 2018-09-09 ENCOUNTER — Telehealth: Payer: Self-pay | Admitting: Neurology

## 2018-09-09 NOTE — Telephone Encounter (Signed)
Called patient and LVM to schedule a 6 month follow-up with Judson Roch NP per Dr. Jannifer Franklin.

## 2018-09-10 ENCOUNTER — Ambulatory Visit (INDEPENDENT_AMBULATORY_CARE_PROVIDER_SITE_OTHER): Payer: Self-pay | Admitting: Orthopedic Surgery

## 2018-09-10 ENCOUNTER — Encounter: Payer: Self-pay | Admitting: Orthopedic Surgery

## 2018-09-10 ENCOUNTER — Other Ambulatory Visit: Payer: Self-pay

## 2018-09-10 VITALS — Ht 70.0 in | Wt 225.0 lb

## 2018-09-10 DIAGNOSIS — L97511 Non-pressure chronic ulcer of other part of right foot limited to breakdown of skin: Secondary | ICD-10-CM

## 2018-09-10 DIAGNOSIS — L97421 Non-pressure chronic ulcer of left heel and midfoot limited to breakdown of skin: Secondary | ICD-10-CM

## 2018-09-10 MED ORDER — DOXYCYCLINE HYCLATE 100 MG PO TABS: 100.0000 mg | ORAL_TABLET | Freq: Two times a day (BID) | ORAL | 0 refills | Status: DC

## 2018-09-15 ENCOUNTER — Encounter: Payer: Self-pay | Admitting: Orthopedic Surgery

## 2018-09-15 NOTE — Progress Notes (Signed)
Office Visit Note   Patient: [name]           Date of Birth: April 29, 1950           MRN: [id number] Visit Date: 09/10/2018              Requested by: Fanny Bien, [address] [place], [place] [address] PCP: Fanny Bien, MD  Chief Complaint  Patient presents with  . Left Foot - Follow-up  . Right Foot - Follow-up      HPI: Patient is a 68 year old gentleman with bilateral insensate neuropathic ulcers on both feet.  With venous insufficiency brawny skin color changes in both legs.  Assessment & Plan: Visit Diagnoses:  1. Non-pressure chronic ulcer of other part of right foot limited to breakdown of skin ([place])   2. Midfoot ulcer, left, limited to breakdown of skin ([place])     Plan: Discussed the importance of minimizing weightbearing.  He will use his kneeling scooter continue with dressing changes prescription for doxycycline sent to [facility]   Follow-Up Instructions: Return in about 2 weeks (around 09/24/2018).   Ortho Exam  Patient is alert, oriented, no adenopathy, well-dressed, normal affect, normal respiratory effort. Examination patient has brawny skin color changes in both legs with venous insufficiency but no ulcers.  There is a 5 cm diameter area of cellulitis on the left foot base of the fifth metatarsal Wegner grade 1 ulcer 2 cm in diameter.  Right foot he states he has some pain there is some mild cellulitis with a very superficial Wegner grade 1 ulcer.  Imaging: No results found. No images are attached to the encounter.  Labs: Lab Results  Component Value Date   HGBA1C 6.0 (H) 12/24/2015   HGBA1C 7.0 (H) 12/25/2013   HGBA1C 10.0 (H) 09/15/2012   ESRSEDRATE 16 02/06/2018   ESRSEDRATE 53 (H) 09/15/2012   REPTSTATUS 12/29/2015 FINAL 12/24/2015   CULT NO GROWTH 5 DAYS 12/24/2015     Lab Results  Component Value Date   ALBUMIN 3.7 12/24/2015   ALBUMIN 3.7 12/28/2014   ALBUMIN 3.2 (L) 09/15/2012    Body mass index is 32.28 kg/m.   Orders:  No orders of the defined types were placed in this encounter.  Meds ordered this encounter  Medications  . doxycycline (VIBRA-TABS) 100 MG tablet    Sig: Take 1 tablet (100 mg total) by mouth 2 (two) times daily.    Dispense:  60 tablet    Refill:  0     Procedures: No procedures performed  Clinical Data: No additional findings.  ROS:  All other systems negative, except as noted in the HPI. Review of Systems  Objective: Vital Signs: Ht 5\' 10"  (1.778 m)   Wt 225 lb (102.1 kg)   BMI 32.28 kg/m   Specialty Comments:  No specialty comments available.  PMFS History: Patient Active Problem List   Diagnosis Date Noted  . Gait abnormality 08/24/2018  . Diabetic neuropathy ([place]) 02/06/2018  . Cervical myelopathy ([place]) 02/06/2018  . Onychomycosis 10/30/2017  . Other spondylosis with radiculopathy, cervical region 01/27/2017  . Midfoot ulcer, left, limited to breakdown of skin ([place]) 11/15/2016  . Lateral epicondylitis, left elbow 08/12/2016  . Cellulitis of fifth toe of right foot 07/29/2016  . Prostate cancer ([place]) 06/19/2016  . Non-pressure chronic ulcer of other part of right foot limited to breakdown of skin ([place]) 04/04/2016  . Cellulitis of leg, right 12/24/2015  . Cellulitis of right lower  extremity 12/24/2015  . CKD (chronic kidney disease), stage III ([place]) 12/25/2013  . Foot infection 12/24/2013  . Foot ulcer, left ([place]) 12/24/2013  . Diabetic foot ulcer ([place]) 09/14/2012  . Gout 09/14/2012  . HTN (hypertension) 09/14/2012  . Hypothyroidism 09/14/2012  . CAD (coronary artery disease) 09/14/2012  . Diabetes mellitus ([place]) 09/14/2012  . Hypercholesteremia   . Neuropathy [facility])    Past Medical History:  Diagnosis Date  . Cervical myelopathy ([place]) 02/06/2018  . Coronary artery disease   . Diabetes mellitus without complication ([place])   . Diabetic neuropathy ([place]) 02/06/2018  . Gait abnormality 08/24/2018  . Hypercholesteremia   . Hypertension   .  Neuropathy   . Prostate cancer [facility])     Family History  Problem Relation Age of Onset  . Diabetes Mellitus II Mother   . Diabetes Mellitus II Father   . CAD Father   . Cancer Father        prostate  . Diabetes Mellitus II Brother   . Diabetes Mellitus II Brother   . Cancer Brother     Past Surgical History:  Procedure Laterality Date  . AMPUTATION Left 12/25/2013   Procedure: AMPUTATION RAY LEFT 5TH RAY;  Surgeon: [name], MD;  Location: [facility];  Service: Orthopedics;  Laterality: Left;  . BACK SURGERY    . CHOLECYSTECTOMY    . CORONARY ARTERY BYPASS GRAFT    . NECK SURGERY     novemver 2019   Social History   Occupational History  . Not on file  Tobacco Use  . Smoking status: Former Smoker    Types: Cigars    Last attempt to quit: 12/24/1988    Years since quitting: 29.7  . Smokeless tobacco: Never Used  Substance and Sexual Activity  . Alcohol use: No  . Drug use: No  . Sexual activity: Yes    Partners: Female

## 2018-09-28 ENCOUNTER — Ambulatory Visit: Payer: Self-pay | Admitting: Orthopedic Surgery

## 2018-10-12 ENCOUNTER — Encounter: Payer: Self-pay | Admitting: Orthopedic Surgery

## 2018-10-12 ENCOUNTER — Other Ambulatory Visit: Payer: Self-pay

## 2018-10-12 ENCOUNTER — Ambulatory Visit (INDEPENDENT_AMBULATORY_CARE_PROVIDER_SITE_OTHER): Payer: Medicare Other | Admitting: Orthopedic Surgery

## 2018-10-12 ENCOUNTER — Ambulatory Visit: Payer: Self-pay

## 2018-10-12 VITALS — Ht 70.0 in | Wt 225.0 lb

## 2018-10-12 DIAGNOSIS — M5442 Lumbago with sciatica, left side: Secondary | ICD-10-CM | POA: Diagnosis not present

## 2018-10-12 DIAGNOSIS — G8929 Other chronic pain: Secondary | ICD-10-CM | POA: Diagnosis not present

## 2018-10-12 DIAGNOSIS — M5441 Lumbago with sciatica, right side: Secondary | ICD-10-CM | POA: Diagnosis not present

## 2018-10-12 DIAGNOSIS — L97511 Non-pressure chronic ulcer of other part of right foot limited to breakdown of skin: Secondary | ICD-10-CM

## 2018-10-12 MED ORDER — PREDNISONE 10 MG PO TABS: 20.0000 mg | ORAL_TABLET | Freq: Every day | ORAL | 0 refills | Status: DC

## 2018-10-12 NOTE — Progress Notes (Signed)
Office Visit Note   Patient: [name]           Date of Birth: 1951/02/06           MRN: [id number] Visit Date: 10/12/2018              Requested by: Fanny Bien, [address] [place],  [place] [address] PCP: Fanny Bien, MD  Chief Complaint  Patient presents with  . Right Foot - Follow-up  . Left Foot - Follow-up  . Lower Back - Pain      HPI: Patient is a 68 year old gentleman who presents with diabetic insensate neuropathic ulcers both feet for follow-up as well as acute lower back pain with radicular symptoms into the left buttocks greater than the right.  Patient states he has had 2 surgeries on his lumbar spine years ago recently has also had a cervical spine surgery as well.  Patient states that he has been having pain for weeks left greater than right lower extremity.  Patient has chronic foot drop on the left.  Assessment & Plan: Visit Diagnoses:  1. Chronic bilateral low back pain with bilateral sciatica     Plan: We will start him on prednisone 20 mg with breakfast in the morning he will check his sugars to make sure that they are not elevated by the prednisone.  Discussed that if he is not getting any relief with the prednisone we would need to get an MRI scan.  Follow-Up Instructions: Return in about 4 weeks (around 11/09/2018).   Ortho Exam  Patient is alert, oriented, no adenopathy, well-dressed, normal affect, normal respiratory effort. Examination patient has chronic foot drop on the left he has a negative straight leg raise bilaterally.  The ulcer over the base of the fifth metatarsal left foot is completely healed.  He has no active EHL or dorsiflexion of the left ankle good plantar flexion.  Examination right lower extremity he has good motor function.  He has a ulcer over the base of the fifth metatarsal right foot.  After informed consent a 10 blade knife was used to debride the skin and soft tissue back to healthy viable tissue the  ulcer is 10 mm in diameter 1 mm deep a Band-Aid was applied.  Imaging: Xr Lumbar Spine 2-3 Views  Result Date: 10/12/2018 2 view radiographs of the lumbar spine shows a degenerative scoliosis in the AP view with multiple periarticular bony spurs.  Lateral radiograph shows straightening of the lumbar lordosis joint space collapse and bony spurs at L4-5 as well as degenerative collapse at T11-T12 and T9 and T8 with bony spurs anteriorly.  There is calcification of the aorta.  No images are attached to the encounter.  Labs: Lab Results  Component Value Date   HGBA1C 6.0 (H) 12/24/2015   HGBA1C 7.0 (H) 12/25/2013   HGBA1C 10.0 (H) 09/15/2012   ESRSEDRATE 16 02/06/2018   ESRSEDRATE 53 (H) 09/15/2012   REPTSTATUS 12/29/2015 FINAL 12/24/2015   CULT NO GROWTH 5 DAYS 12/24/2015     Lab Results  Component Value Date   ALBUMIN 3.7 12/24/2015   ALBUMIN 3.7 12/28/2014   ALBUMIN 3.2 (L) 09/15/2012    No results found for: MG No results found for: VD25OH  No results found for: PREALBUMIN CBC EXTENDED Latest Ref Rng & Units 12/27/2015 12/26/2015 12/25/2015  WBC 4.0 - 10.5 K/uL 7.0 8.4 9.1  RBC 4.22 - 5.81 MIL/uL 3.99(L) 3.94(L) 3.71(L)  HGB 13.0 - 17.0  g/dL 11.3(L) 11.1(L) 10.5(L)  HCT 39.0 - 52.0 % 34.3(L) 35.0(L) 32.9(L)  PLT 150 - 400 K/uL 159 127(L) 105(L)  NEUTROABS 1.7 - 7.7 K/uL - - -  LYMPHSABS 0.7 - 4.0 K/uL - - -     Body mass index is 32.28 kg/m.  Orders:  Orders Placed This Encounter  Procedures  . XR Lumbar Spine 2-3 Views   No orders of the defined types were placed in this encounter.    Procedures: No procedures performed  Clinical Data: No additional findings.  ROS:  All other systems negative, except as noted in the HPI. Review of Systems  Objective: Vital Signs: Ht 5\' 10"  (1.778 m)   Wt 225 lb (102.1 kg)   BMI 32.28 kg/m   Specialty Comments:  No specialty comments available.  PMFS History: Patient Active Problem List   Diagnosis Date Noted   . Gait abnormality 08/24/2018  . Diabetic neuropathy ([place]) 02/06/2018  . Cervical myelopathy ([place]) 02/06/2018  . Onychomycosis 10/30/2017  . Other spondylosis with radiculopathy, cervical region 01/27/2017  . Midfoot ulcer, left, limited to breakdown of skin ([place]) 11/15/2016  . Lateral epicondylitis, left elbow 08/12/2016  . Cellulitis of fifth toe of right foot 07/29/2016  . Prostate cancer ([place]) 06/19/2016  . Non-pressure chronic ulcer of other part of right foot limited to breakdown of skin ([place]) 04/04/2016  . Cellulitis of leg, right 12/24/2015  . Cellulitis of right lower extremity 12/24/2015  . CKD (chronic kidney disease), stage III ([place]) 12/25/2013  . Foot infection 12/24/2013  . Foot ulcer, left ([place]) 12/24/2013  . Diabetic foot ulcer ([place]) 09/14/2012  . Gout 09/14/2012  . HTN (hypertension) 09/14/2012  . Hypothyroidism 09/14/2012  . CAD (coronary artery disease) 09/14/2012  . Diabetes mellitus ([place]) 09/14/2012  . Hypercholesteremia   . Neuropathy [facility])    Past Medical History:  Diagnosis Date  . Cervical myelopathy ([place]) 02/06/2018  . Coronary artery disease   . Diabetes mellitus without complication ([place])   . Diabetic neuropathy ([place]) 02/06/2018  . Gait abnormality 08/24/2018  . Hypercholesteremia   . Hypertension   . Neuropathy   . Prostate cancer [facility])     Family History  Problem Relation Age of Onset  . Diabetes Mellitus II Mother   . Diabetes Mellitus II Father   . CAD Father   . Cancer Father        prostate  . Diabetes Mellitus II Brother   . Diabetes Mellitus II Brother   . Cancer Brother     Past Surgical History:  Procedure Laterality Date  . AMPUTATION Left 12/25/2013   Procedure: AMPUTATION RAY LEFT 5TH RAY;  Surgeon: [name], MD;  Location: [facility];  Service: Orthopedics;  Laterality: Left;  . BACK SURGERY    . CHOLECYSTECTOMY    . CORONARY ARTERY BYPASS GRAFT    . NECK SURGERY     novemver 2019   Social History   Occupational  History  . Not on file  Tobacco Use  . Smoking status: Former Smoker    Types: Cigars    Quit date: 12/24/1988    Years since quitting: 29.8  . Smokeless tobacco: Never Used  Substance and Sexual Activity  . Alcohol use: No  . Drug use: No  . Sexual activity: Yes    Partners: Female

## 2018-10-21 ENCOUNTER — Other Ambulatory Visit: Payer: Self-pay | Admitting: Orthopedic Surgery

## 2018-10-21 NOTE — Telephone Encounter (Signed)
[name] please advise . Thanks

## 2018-10-22 ENCOUNTER — Telehealth: Payer: Self-pay | Admitting: Orthopedic Surgery

## 2018-10-22 NOTE — Telephone Encounter (Signed)
Patient called stating that he was told to call and request a MRI by Dr. [name]. No referral in.  Please call patient to advise. [phone]

## 2018-10-23 ENCOUNTER — Other Ambulatory Visit: Payer: Self-pay

## 2018-10-23 DIAGNOSIS — G8911 Acute pain due to trauma: Secondary | ICD-10-CM

## 2018-10-23 NOTE — Telephone Encounter (Signed)
Patient referral was ordered and sent to [facility]; patient was notified of order and informed someone will be calling him for an appt.

## 2018-10-23 NOTE — Telephone Encounter (Signed)
Can you send orders for LS spine MRI scan without contrast please for lumbar radiculopathy

## 2018-10-23 NOTE — Telephone Encounter (Signed)
Shawn, can you please advise? Dr [name] did dictate for patient to be referred to have a MRI done. Thank you.

## 2018-11-03 ENCOUNTER — Other Ambulatory Visit: Payer: Self-pay | Admitting: Orthopedic Surgery

## 2018-11-03 NOTE — Telephone Encounter (Signed)
Do you want to give refill

## 2018-11-09 ENCOUNTER — Ambulatory Visit: Payer: BLUE CROSS/BLUE SHIELD | Admitting: Orthopedic Surgery

## 2018-11-14 ENCOUNTER — Other Ambulatory Visit: Payer: Self-pay | Admitting: Family

## 2018-11-16 NOTE — Telephone Encounter (Signed)
Do you want to refill for this pt?

## 2018-11-20 ENCOUNTER — Ambulatory Visit
Admission: RE | Admit: 2018-11-20 | Discharge: 2018-11-20 | Disposition: A | Discharge status: Home / Self Care | Payer: Medicare Other | Source: Ambulatory Visit | Attending: Orthopedic Surgery | Admitting: Orthopedic Surgery

## 2018-11-20 ENCOUNTER — Other Ambulatory Visit: Payer: Self-pay

## 2018-11-20 DIAGNOSIS — G8911 Acute pain due to trauma: Secondary | ICD-10-CM

## 2018-11-23 ENCOUNTER — Other Ambulatory Visit: Payer: Self-pay

## 2018-11-23 DIAGNOSIS — M5442 Lumbago with sciatica, left side: Secondary | ICD-10-CM

## 2018-11-23 DIAGNOSIS — G8929 Other chronic pain: Secondary | ICD-10-CM

## 2018-11-24 ENCOUNTER — Encounter: Payer: Self-pay | Admitting: Orthopedic Surgery

## 2018-11-24 ENCOUNTER — Ambulatory Visit: Payer: BLUE CROSS/BLUE SHIELD | Admitting: Orthopedic Surgery

## 2018-11-24 ENCOUNTER — Ambulatory Visit (INDEPENDENT_AMBULATORY_CARE_PROVIDER_SITE_OTHER): Payer: Medicare Other | Admitting: Orthopedic Surgery

## 2018-11-24 VITALS — Ht 70.0 in | Wt 225.0 lb

## 2018-11-24 DIAGNOSIS — L97421 Non-pressure chronic ulcer of left heel and midfoot limited to breakdown of skin: Secondary | ICD-10-CM | POA: Diagnosis not present

## 2018-11-24 DIAGNOSIS — M5441 Lumbago with sciatica, right side: Secondary | ICD-10-CM | POA: Diagnosis not present

## 2018-11-24 DIAGNOSIS — G8929 Other chronic pain: Secondary | ICD-10-CM

## 2018-11-24 DIAGNOSIS — M5442 Lumbago with sciatica, left side: Secondary | ICD-10-CM | POA: Diagnosis not present

## 2018-11-24 DIAGNOSIS — L97511 Non-pressure chronic ulcer of other part of right foot limited to breakdown of skin: Secondary | ICD-10-CM | POA: Diagnosis not present

## 2018-11-24 DIAGNOSIS — I2510 Atherosclerotic heart disease of native coronary artery without angina pectoris: Secondary | ICD-10-CM | POA: Diagnosis not present

## 2018-11-24 NOTE — Progress Notes (Addendum)
Office Visit Note   Patient: [name]           Date of Birth: 03/12/1951           MRN: [id number] Visit Date: 11/24/2018              Requested by: Fanny Bien, [address] [place],  [place] [address] PCP: Fanny Bien, MD  Chief Complaint  Patient presents with  . Lower Back - Follow-up    MRI review L spine       HPI: Patient is a 68 year old gentleman who presents for 3 separate issues #1 follow-up status post MRI scan of his lumbar spine he states he still has neuropathic burning pain involving the left buttocks and left anterior thigh as well as some mild radicular pain into the right anterior thigh.  Patient complains of weakness with his hip flexors.  Patient also has chronic Wegner grade 1 ulcers over the base of the fifth metatarsals bilaterally.  Assessment & Plan: Visit Diagnoses:  1. Non-pressure chronic ulcer of other part of right foot limited to breakdown of skin ([place])   2. Midfoot ulcer, left, limited to breakdown of skin ([place])     Plan: Will schedule patient with a follow-up appointment Dr. Ernestina Patches for evaluation for epidural steroid injection.  The Wegner grade 1 ulcers were debrided x2 he will use gauze and a Band-Aid and follow-up in 4 weeks.  Follow-Up Instructions: Return in about 4 weeks (around 12/22/2018).   Ortho Exam  Patient is alert, oriented, no adenopathy, well-dressed, normal affect, normal respiratory effort. Examination patient has radicular pain in the buttocks on the left side into the both thighs bilaterally.  Review the MRI scan he has L3-4 spinal stenosis as well as T11-T12 spinal stenosis.  There is an L4-5 foraminal protrusion compressing the L5 nerve root and moderate right foraminal impingement at L5-S1.  There is facet arthropathy at L3-4 and degenerative disc disease at T11-12 and L4-5.  After informed consent a 10 blade knife was used to debride the skin and soft tissue from the leg grade 1 ulcers to the base  of the fifth metatarsal bilaterally.  Both ulcers had good bleeding granulation tissue and were touched with silver nitrate after debridement they are both 2 cm in diameter 1 mm deep.  Patient also has a small Wegner grade 1 ulcer beneath the fifth metatarsal head of the right foot that is 5 mm diameter 1 mm deep. Imaging: No results found. No images are attached to the encounter.  Labs: Lab Results  Component Value Date   HGBA1C 6.0 (H) 12/24/2015   HGBA1C 7.0 (H) 12/25/2013   HGBA1C 10.0 (H) 09/15/2012   ESRSEDRATE 16 02/06/2018   ESRSEDRATE 53 (H) 09/15/2012   REPTSTATUS 12/29/2015 FINAL 12/24/2015   CULT NO GROWTH 5 DAYS 12/24/2015     Lab Results  Component Value Date   ALBUMIN 3.7 12/24/2015   ALBUMIN 3.7 12/28/2014   ALBUMIN 3.2 (L) 09/15/2012    No results found for: MG No results found for: VD25OH  No results found for: PREALBUMIN CBC EXTENDED Latest Ref Rng & Units 12/27/2015 12/26/2015 12/25/2015  WBC 4.0 - 10.5 K/uL 7.0 8.4 9.1  RBC 4.22 - 5.81 MIL/uL 3.99(L) 3.94(L) 3.71(L)  HGB 13.0 - 17.0 g/dL 11.3(L) 11.1(L) 10.5(L)  HCT 39.0 - 52.0 % 34.3(L) 35.0(L) 32.9(L)  PLT 150 - 400 K/uL 159 127(L) 105(L)  NEUTROABS 1.7 - 7.7 K/uL - - -  LYMPHSABS 0.7 - 4.0 K/uL - - -     Body mass index is 32.28 kg/m.  Orders:  No orders of the defined types were placed in this encounter.  No orders of the defined types were placed in this encounter.    Procedures: No procedures performed  Clinical Data: No additional findings.  ROS:  All other systems negative, except as noted in the HPI. Review of Systems  Objective: Vital Signs: Ht 5\' 10"  (1.778 m)   Wt 225 lb (102.1 kg)   BMI 32.28 kg/m   Specialty Comments:  No specialty comments available.  PMFS History: Patient Active Problem List   Diagnosis Date Noted  . Gait abnormality 08/24/2018  . Diabetic neuropathy ([place]) 02/06/2018  . Cervical myelopathy ([place]) 02/06/2018  . Onychomycosis 10/30/2017  .  Other spondylosis with radiculopathy, cervical region 01/27/2017  . Midfoot ulcer, left, limited to breakdown of skin ([place]) 11/15/2016  . Lateral epicondylitis, left elbow 08/12/2016  . Cellulitis of fifth toe of right foot 07/29/2016  . Prostate cancer ([place]) 06/19/2016  . Non-pressure chronic ulcer of other part of right foot limited to breakdown of skin ([place]) 04/04/2016  . Cellulitis of leg, right 12/24/2015  . Cellulitis of right lower extremity 12/24/2015  . CKD (chronic kidney disease), stage III ([place]) 12/25/2013  . Foot infection 12/24/2013  . Foot ulcer, left ([place]) 12/24/2013  . Diabetic foot ulcer ([place]) 09/14/2012  . Gout 09/14/2012  . HTN (hypertension) 09/14/2012  . Hypothyroidism 09/14/2012  . CAD (coronary artery disease) 09/14/2012  . Diabetes mellitus ([place]) 09/14/2012  . Hypercholesteremia   . Neuropathy [facility])    Past Medical History:  Diagnosis Date  . Cervical myelopathy ([place]) 02/06/2018  . Coronary artery disease   . Diabetes mellitus without complication ([place])   . Diabetic neuropathy ([place]) 02/06/2018  . Gait abnormality 08/24/2018  . Hypercholesteremia   . Hypertension   . Neuropathy   . Prostate cancer [facility])     Family History  Problem Relation Age of Onset  . Diabetes Mellitus II Mother   . Diabetes Mellitus II Father   . CAD Father   . Cancer Father        prostate  . Diabetes Mellitus II Brother   . Diabetes Mellitus II Brother   . Cancer Brother     Past Surgical History:  Procedure Laterality Date  . AMPUTATION Left 12/25/2013   Procedure: AMPUTATION RAY LEFT 5TH RAY;  Surgeon: [name], MD;  Location: [facility];  Service: Orthopedics;  Laterality: Left;  . BACK SURGERY    . CHOLECYSTECTOMY    . CORONARY ARTERY BYPASS GRAFT    . NECK SURGERY     novemver 2019   Social History   Occupational History  . Not on file  Tobacco Use  . Smoking status: Former Smoker    Types: Cigars    Quit date: 12/24/1988    Years since quitting: 29.9  .  Smokeless tobacco: Never Used  Substance and Sexual Activity  . Alcohol use: No  . Drug use: No  . Sexual activity: Yes    Partners: Female

## 2018-12-02 ENCOUNTER — Encounter: Payer: Self-pay | Admitting: Physical Medicine and Rehabilitation

## 2018-12-02 ENCOUNTER — Ambulatory Visit (INDEPENDENT_AMBULATORY_CARE_PROVIDER_SITE_OTHER): Payer: Medicare Other | Admitting: Physical Medicine and Rehabilitation

## 2018-12-02 ENCOUNTER — Telehealth: Payer: Self-pay | Admitting: Physical Medicine and Rehabilitation

## 2018-12-02 VITALS — BP 158/90 | HR 70

## 2018-12-02 DIAGNOSIS — I2510 Atherosclerotic heart disease of native coronary artery without angina pectoris: Secondary | ICD-10-CM

## 2018-12-02 DIAGNOSIS — M48062 Spinal stenosis, lumbar region with neurogenic claudication: Secondary | ICD-10-CM

## 2018-12-02 DIAGNOSIS — E1142 Type 2 diabetes mellitus with diabetic polyneuropathy: Secondary | ICD-10-CM

## 2018-12-02 DIAGNOSIS — M5416 Radiculopathy, lumbar region: Secondary | ICD-10-CM

## 2018-12-02 NOTE — Telephone Encounter (Signed)
Per [employer] portal, no PA is needed for [phone] for [facility] Supplement customers.

## 2018-12-02 NOTE — Progress Notes (Signed)
Bilateral buttock pain left more than right. Sometimes has anterior thigh pain. Symptoms have been present for several months. Has neuropathy/ foot pain. Exercise/ stretching helps relieve the pain. Numeric Pain Rating Scale and Functional Assessment Average Pain 6 Pain Right Now 4 My pain is intermittent, dull and aching Pain is worse with: walking Pain improves with: rest and therapy/exercise   In the last MONTH (on 0-10 scale) has pain interfered with the following?  1. General activity like being  able to carry out your everyday physical activities such as walking, climbing stairs, carrying groceries, or moving a chair?  Rating(8)  2. Relation with others like being able to carry out your usual social activities and roles such as  activities at home, at work and in your community. Rating(8)  3. Enjoyment of life such that you have  been bothered by emotional problems such as feeling anxious, depressed or irritable?  Rating(1)

## 2018-12-07 ENCOUNTER — Encounter: Payer: Self-pay | Admitting: Physical Medicine and Rehabilitation

## 2018-12-07 ENCOUNTER — Ambulatory Visit: Payer: Self-pay

## 2018-12-07 ENCOUNTER — Ambulatory Visit (INDEPENDENT_AMBULATORY_CARE_PROVIDER_SITE_OTHER): Payer: Medicare Other | Admitting: Physical Medicine and Rehabilitation

## 2018-12-07 VITALS — BP 158/85 | HR 77

## 2018-12-07 DIAGNOSIS — M5416 Radiculopathy, lumbar region: Secondary | ICD-10-CM | POA: Diagnosis not present

## 2018-12-07 MED ORDER — BETAMETHASONE SOD PHOS & ACET 6 (3-3) MG/ML IJ SUSP
12.0000 mg | Freq: Once | INTRAMUSCULAR | Status: AC
  Administered 2018-12-07: 12 mg

## 2018-12-07 NOTE — Progress Notes (Signed)
 .  Numeric Pain Rating Scale and Functional Assessment Average Pain 5   In the last MONTH (on 0-10 scale) has pain interfered with the following?  1. General activity like being  able to carry out your everyday physical activities such as walking, climbing stairs, carrying groceries, or moving a chair?  Rating(6)   +Driver, -BT, -Dye Allergies.  

## 2018-12-08 NOTE — Progress Notes (Signed)
[name] - 68 y.o. male MRN [id number]  Date of birth: 11/11/1950  Office Visit Note: Visit Date: 12/07/2018 PCP: Fanny Bien, MD Referred by: Fanny Bien, MD  Subjective: Chief Complaint  Patient presents with  . Lower Back - Pain   HPI:  [name] is a 68 y.o. male who comes in today For planned bilateral L3 transforaminal epidural steroid injection for low back pain and left more than right radicular pain with severe stenosis at L3-4.  Please see her prior note for further details justification.  Case complicated by prior cervical ACDF with history of myelopathy as well as diabetic polyneuropathy.  ROS Otherwise per HPI.  Assessment & Plan: Visit Diagnoses:  1. Lumbar radiculopathy     Plan: No additional findings.   Meds & Orders:  Meds ordered this encounter  Medications  . betamethasone acetate-betamethasone sodium phosphate (CELESTONE) injection 12 mg    Orders Placed This Encounter  Procedures  . XR C-ARM NO REPORT  . Epidural Steroid injection    Follow-up: No follow-ups on file.   Procedures: No procedures performed  Lumbosacral Transforaminal Epidural Steroid Injection - Sub-Pedicular Approach with Fluoroscopic Guidance  Patient: [name]      Date of Birth: February 15, 1951 MRN: [id number] PCP: Fanny Bien, MD      Visit Date: 12/07/2018   Universal Protocol:    Date/Time: 12/07/2018  Consent Given By: the patient  Position: PRONE  Additional Comments: Vital signs were monitored before and after the procedure. Patient was prepped and draped in the usual sterile fashion. The correct patient, procedure, and site was verified.   Injection Procedure Details:  Procedure Site One Meds Administered:  Meds ordered this encounter  Medications  . betamethasone acetate-betamethasone sodium phosphate (CELESTONE) injection 12 mg    Laterality: Bilateral  Location/Site:  L3-L4  Needle size: 22 G  Needle type: Spinal  Needle  Placement: Transforaminal  Findings:    -Comments: Excellent flow of contrast along the nerve and into the epidural space.  Procedure Details: After squaring off the end-plates to get a true AP view, the C-arm was positioned so that an oblique view of the foramen as noted above was visualized. The target area is just inferior to the "nose of the scotty dog" or sub pedicular. The soft tissues overlying this structure were infiltrated with 2-3 ml. of 1% Lidocaine without Epinephrine.  The spinal needle was inserted toward the target using a "trajectory" view along the fluoroscope beam.  Under AP and lateral visualization, the needle was advanced so it did not puncture dura and was located close the 6 O'Clock position of the pedical in AP tracterory. Biplanar projections were used to confirm position. Aspiration was confirmed to be negative for CSF and/or blood. A 1-2 ml. volume of Isovue-250 was injected and flow of contrast was noted at each level. Radiographs were obtained for documentation purposes.   After attaining the desired flow of contrast documented above, a 0.5 to 1.0 ml test dose of 0.25% Marcaine was injected into each respective transforaminal space.  The patient was observed for 90 seconds post injection.  After no sensory deficits were reported, and normal lower extremity motor function was noted,   the above injectate was administered so that equal amounts of the injectate were placed at each foramen (level) into the transforaminal epidural space.   Additional Comments:  The patient tolerated the procedure well Dressing: 2 x 2 sterile gauze and Band-Aid    Post-procedure details: Patient  was observed during the procedure. Post-procedure instructions were reviewed.  Patient left the clinic in stable condition.     Clinical History: MRI LUMBAR SPINE WITHOUT CONTRAST  TECHNIQUE: Multiplanar, multisequence MR imaging of the lumbar spine was performed. No intravenous  contrast was administered.  COMPARISON:  10/12/2018 radiography  FINDINGS: Segmentation:  5 lumbar type vertebrae  Alignment:  Mild levo scoliotic curvature.  Vertebrae:  No fracture, evidence of discitis, or bone lesion.  Conus medullaris and cauda equina: Conus extends to the T12-L1 level. Conus and cauda equina appear normal.  Paraspinal and other soft tissues: Right upper pole renal cyst with apparent complexity likely related to motion.  Disc levels:  T11-12: Advanced disc narrowing with circumferential bulge and posterior element hypertrophy. Spinal stenosis with mild right cord impingement. Right more than left foraminal stenosis.  T12- L1: Left facet spurring.  Negative disc  L1-L2: Bilateral moderate facet spurring with negative disc. Mild spinal stenosis.  L2-L3: Moderate degenerative facet spurring.  Spondylosis.  L3-L4: Advanced facet arthropathy with 5 mm degenerative cyst in the ligamentum flavum. Minor bulging of the disc. Severe spinal stenosis. Both foramina are patent  L4-L5: Advanced disc narrowing with right eccentric protrusion compressing the right L5 nerve root in the subarticular recess. Degenerative facet spurring asymmetric to the right with mild/moderate right foraminal narrowing.  L5-S1:Degenerative facet spurring with asymmetric bulky hypertrophy on the right impinging on the right foraminal L5 nerve root.  IMPRESSION: 1. Generalized facet arthropathy most advanced at L3-4. There is superimposed advanced disc degeneration at T11-12 and L4-5. 2. L3-4 severe spinal stenosis related to facet hypertrophy and a ligamentum flavum ganglion. 3. T11-12 spinal stenosis with mild right cord flattening and biforaminal impingement. 4. L4-5 right foraminal protrusion compressing the L5 nerve root. 5. L5-S1 moderate right foraminal impingement.   Electronically Signed   By: Monte Fantasia M.D.   On: 11/20/2018 10:56      Objective:  VS:  HT:    WT:   BMI:     BP:(!) 158/85  HR:77bpm  TEMP: ( )  RESP:  Physical Exam  Ortho Exam Imaging: Xr C-arm No Report  Result Date: 12/07/2018 Please see Notes tab for imaging impression.

## 2018-12-08 NOTE — Procedures (Signed)
Lumbosacral Transforaminal Epidural Steroid Injection - Sub-Pedicular Approach with Fluoroscopic Guidance  Patient: [name]      Date of Birth: 05-May-1950 MRN: [id number] PCP: Fanny Bien, MD      Visit Date: 12/07/2018   Universal Protocol:    Date/Time: 12/07/2018  Consent Given By: the patient  Position: PRONE  Additional Comments: Vital signs were monitored before and after the procedure. Patient was prepped and draped in the usual sterile fashion. The correct patient, procedure, and site was verified.   Injection Procedure Details:  Procedure Site One Meds Administered:  Meds ordered this encounter  Medications  . betamethasone acetate-betamethasone sodium phosphate (CELESTONE) injection 12 mg    Laterality: Bilateral  Location/Site:  L3-L4  Needle size: 22 G  Needle type: Spinal  Needle Placement: Transforaminal  Findings:    -Comments: Excellent flow of contrast along the nerve and into the epidural space.  Procedure Details: After squaring off the end-plates to get a true AP view, the C-arm was positioned so that an oblique view of the foramen as noted above was visualized. The target area is just inferior to the "nose of the scotty dog" or sub pedicular. The soft tissues overlying this structure were infiltrated with 2-3 ml. of 1% Lidocaine without Epinephrine.  The spinal needle was inserted toward the target using a "trajectory" view along the fluoroscope beam.  Under AP and lateral visualization, the needle was advanced so it did not puncture dura and was located close the 6 O'Clock position of the pedical in AP tracterory. Biplanar projections were used to confirm position. Aspiration was confirmed to be negative for CSF and/or blood. A 1-2 ml. volume of Isovue-250 was injected and flow of contrast was noted at each level. Radiographs were obtained for documentation purposes.   After attaining the desired flow of contrast documented above, a 0.5 to  1.0 ml test dose of 0.25% Marcaine was injected into each respective transforaminal space.  The patient was observed for 90 seconds post injection.  After no sensory deficits were reported, and normal lower extremity motor function was noted,   the above injectate was administered so that equal amounts of the injectate were placed at each foramen (level) into the transforaminal epidural space.   Additional Comments:  The patient tolerated the procedure well Dressing: 2 x 2 sterile gauze and Band-Aid    Post-procedure details: Patient was observed during the procedure. Post-procedure instructions were reviewed.  Patient left the clinic in stable condition.

## 2018-12-11 DIAGNOSIS — Z951 Presence of aortocoronary bypass graft: Secondary | ICD-10-CM

## 2018-12-13 ENCOUNTER — Other Ambulatory Visit: Payer: Self-pay | Admitting: Family

## 2018-12-14 ENCOUNTER — Other Ambulatory Visit: Payer: Self-pay

## 2018-12-14 ENCOUNTER — Inpatient Hospital Stay (HOSPITAL_COMMUNITY): Payer: Medicare Other

## 2018-12-14 ENCOUNTER — Inpatient Hospital Stay (HOSPITAL_COMMUNITY)
Admission: EM | Admit: 2018-12-14 | Discharge: 2018-12-18 | DRG: 617 | Disposition: A | Discharge status: Home Health | Payer: Medicare Other | Attending: Internal Medicine | Admitting: Internal Medicine

## 2018-12-14 ENCOUNTER — Emergency Department (HOSPITAL_COMMUNITY): Payer: Medicare Other

## 2018-12-14 DIAGNOSIS — Z8546 Personal history of malignant neoplasm of prostate: Secondary | ICD-10-CM | POA: Diagnosis not present

## 2018-12-14 DIAGNOSIS — D631 Anemia in chronic kidney disease: Secondary | ICD-10-CM | POA: Diagnosis present

## 2018-12-14 DIAGNOSIS — I2510 Atherosclerotic heart disease of native coronary artery without angina pectoris: Secondary | ICD-10-CM | POA: Diagnosis present

## 2018-12-14 DIAGNOSIS — L03115 Cellulitis of right lower limb: Secondary | ICD-10-CM | POA: Diagnosis present

## 2018-12-14 DIAGNOSIS — L02611 Cutaneous abscess of right foot: Secondary | ICD-10-CM | POA: Diagnosis present

## 2018-12-14 DIAGNOSIS — Z7982 Long term (current) use of aspirin: Secondary | ICD-10-CM | POA: Diagnosis not present

## 2018-12-14 DIAGNOSIS — E1169 Type 2 diabetes mellitus with other specified complication: Secondary | ICD-10-CM | POA: Diagnosis present

## 2018-12-14 DIAGNOSIS — E1159 Type 2 diabetes mellitus with other circulatory complications: Secondary | ICD-10-CM | POA: Diagnosis present

## 2018-12-14 DIAGNOSIS — Z8042 Family history of malignant neoplasm of prostate: Secondary | ICD-10-CM

## 2018-12-14 DIAGNOSIS — E1122 Type 2 diabetes mellitus with diabetic chronic kidney disease: Secondary | ICD-10-CM | POA: Diagnosis present

## 2018-12-14 DIAGNOSIS — Z794 Long term (current) use of insulin: Secondary | ICD-10-CM | POA: Diagnosis not present

## 2018-12-14 DIAGNOSIS — E11621 Type 2 diabetes mellitus with foot ulcer: Secondary | ICD-10-CM | POA: Diagnosis not present

## 2018-12-14 DIAGNOSIS — I120 Hypertensive chronic kidney disease with stage 5 chronic kidney disease or end stage renal disease: Secondary | ICD-10-CM | POA: Diagnosis present

## 2018-12-14 DIAGNOSIS — M86171 Other acute osteomyelitis, right ankle and foot: Secondary | ICD-10-CM | POA: Diagnosis present

## 2018-12-14 DIAGNOSIS — Z79890 Hormone replacement therapy (postmenopausal): Secondary | ICD-10-CM

## 2018-12-14 DIAGNOSIS — Z833 Family history of diabetes mellitus: Secondary | ICD-10-CM | POA: Diagnosis not present

## 2018-12-14 DIAGNOSIS — Z951 Presence of aortocoronary bypass graft: Secondary | ICD-10-CM | POA: Diagnosis not present

## 2018-12-14 DIAGNOSIS — M869 Osteomyelitis, unspecified: Secondary | ICD-10-CM

## 2018-12-14 DIAGNOSIS — E7800 Pure hypercholesterolemia, unspecified: Secondary | ICD-10-CM | POA: Diagnosis present

## 2018-12-14 DIAGNOSIS — E039 Hypothyroidism, unspecified: Secondary | ICD-10-CM | POA: Diagnosis present

## 2018-12-14 DIAGNOSIS — N185 Chronic kidney disease, stage 5: Secondary | ICD-10-CM | POA: Diagnosis present

## 2018-12-14 DIAGNOSIS — Z809 Family history of malignant neoplasm, unspecified: Secondary | ICD-10-CM

## 2018-12-14 DIAGNOSIS — L97509 Non-pressure chronic ulcer of other part of unspecified foot with unspecified severity: Secondary | ICD-10-CM | POA: Diagnosis present

## 2018-12-14 DIAGNOSIS — G959 Disease of spinal cord, unspecified: Secondary | ICD-10-CM | POA: Diagnosis present

## 2018-12-14 DIAGNOSIS — M86271 Subacute osteomyelitis, right ankle and foot: Secondary | ICD-10-CM | POA: Diagnosis not present

## 2018-12-14 DIAGNOSIS — L0390 Cellulitis, unspecified: Secondary | ICD-10-CM | POA: Diagnosis not present

## 2018-12-14 DIAGNOSIS — N186 End stage renal disease: Secondary | ICD-10-CM | POA: Diagnosis present

## 2018-12-14 DIAGNOSIS — E119 Type 2 diabetes mellitus without complications: Secondary | ICD-10-CM

## 2018-12-14 DIAGNOSIS — E1142 Type 2 diabetes mellitus with diabetic polyneuropathy: Secondary | ICD-10-CM | POA: Diagnosis present

## 2018-12-14 DIAGNOSIS — I959 Hypotension, unspecified: Secondary | ICD-10-CM | POA: Diagnosis not present

## 2018-12-14 DIAGNOSIS — N179 Acute kidney failure, unspecified: Secondary | ICD-10-CM | POA: Diagnosis present

## 2018-12-14 DIAGNOSIS — L97414 Non-pressure chronic ulcer of right heel and midfoot with necrosis of bone: Secondary | ICD-10-CM | POA: Diagnosis not present

## 2018-12-14 DIAGNOSIS — Z87891 Personal history of nicotine dependence: Secondary | ICD-10-CM

## 2018-12-14 DIAGNOSIS — Z20828 Contact with and (suspected) exposure to other viral communicable diseases: Secondary | ICD-10-CM | POA: Diagnosis present

## 2018-12-14 DIAGNOSIS — N184 Chronic kidney disease, stage 4 (severe): Secondary | ICD-10-CM | POA: Diagnosis present

## 2018-12-14 DIAGNOSIS — Z8249 Family history of ischemic heart disease and other diseases of the circulatory system: Secondary | ICD-10-CM

## 2018-12-14 DIAGNOSIS — E872 Acidosis: Secondary | ICD-10-CM | POA: Diagnosis not present

## 2018-12-14 DIAGNOSIS — Z7952 Long term (current) use of systemic steroids: Secondary | ICD-10-CM | POA: Diagnosis not present

## 2018-12-14 DIAGNOSIS — I96 Gangrene, not elsewhere classified: Secondary | ICD-10-CM | POA: Diagnosis present

## 2018-12-14 DIAGNOSIS — Z91018 Allergy to other foods: Secondary | ICD-10-CM

## 2018-12-14 DIAGNOSIS — E11622 Type 2 diabetes mellitus with other skin ulcer: Secondary | ICD-10-CM

## 2018-12-14 DIAGNOSIS — E1152 Type 2 diabetes mellitus with diabetic peripheral angiopathy with gangrene: Secondary | ICD-10-CM | POA: Diagnosis present

## 2018-12-14 DIAGNOSIS — I10 Essential (primary) hypertension: Secondary | ICD-10-CM | POA: Diagnosis present

## 2018-12-14 LAB — COMPREHENSIVE METABOLIC PANEL
ALT: 25 U/L (ref 0–44)
AST: 27 U/L (ref 15–41)
Albumin: 3 g/dL — ABNORMAL LOW (ref 3.5–5.0)
Alkaline Phosphatase: 46 U/L (ref 38–126)
Anion gap: 15 (ref 5–15)
BUN: 58 mg/dL — ABNORMAL HIGH (ref 8–23)
CO2: 16 mmol/L — ABNORMAL LOW (ref 22–32)
Calcium: 8.5 mg/dL — ABNORMAL LOW (ref 8.9–10.3)
Chloride: 103 mmol/L (ref 98–111)
Creatinine, Ser: 5.14 mg/dL — ABNORMAL HIGH (ref 0.61–1.24)
GFR calc Af Amer: 12 mL/min — ABNORMAL LOW (ref >60)
GFR calc non Af Amer: 11 mL/min — ABNORMAL LOW (ref >60)
Glucose, Bld: 237 mg/dL — ABNORMAL HIGH (ref 70–99)
Potassium: 4.5 mmol/L (ref 3.5–5.1)
Sodium: 134 mmol/L — ABNORMAL LOW (ref 135–145)
Total Bilirubin: 0.6 mg/dL (ref 0.3–1.2)
Total Protein: 6.7 g/dL (ref 6.5–8.1)

## 2018-12-14 LAB — URINALYSIS, ROUTINE W REFLEX MICROSCOPIC
Bilirubin Urine: NEGATIVE
Glucose, UA: 50 mg/dL — AB
Ketones, ur: NEGATIVE mg/dL
Leukocytes,Ua: NEGATIVE
Nitrite: NEGATIVE
Protein, ur: >=300 mg/dL — AB
Specific Gravity, Urine: 1.019 (ref 1.005–1.030)
pH: 5 (ref 5.0–8.0)

## 2018-12-14 LAB — CBC
HCT: 26.2 % — ABNORMAL LOW (ref 39.0–52.0)
Hemoglobin: 8.7 g/dL — ABNORMAL LOW (ref 13.0–17.0)
MCH: 29.3 pg (ref 26.0–34.0)
MCHC: 33.2 g/dL (ref 30.0–36.0)
MCV: 88.2 fL (ref 80.0–100.0)
Platelets: 175 10*3/uL (ref 150–400)
RBC: 2.97 MIL/uL — ABNORMAL LOW (ref 4.22–5.81)
RDW: 15.2 % (ref 11.5–15.5)
WBC: 11.7 10*3/uL — ABNORMAL HIGH (ref 4.0–10.5)
nRBC: 0 % (ref 0.0–0.2)

## 2018-12-14 LAB — HEMOGLOBIN A1C
Hgb A1c MFr Bld: 6.3 % — ABNORMAL HIGH (ref 4.8–5.6)
Mean Plasma Glucose: 134.11 mg/dL

## 2018-12-14 LAB — CBC WITH DIFFERENTIAL/PLATELET
Abs Immature Granulocytes: 0.19 10*3/uL — ABNORMAL HIGH (ref 0.00–0.07)
Basophils Absolute: 0 10*3/uL (ref 0.0–0.1)
Basophils Relative: 0 %
Eosinophils Absolute: 0 10*3/uL (ref 0.0–0.5)
Eosinophils Relative: 0 %
HCT: 33.1 % — ABNORMAL LOW (ref 39.0–52.0)
Hemoglobin: 10.4 g/dL — ABNORMAL LOW (ref 13.0–17.0)
Immature Granulocytes: 2 %
Lymphocytes Relative: 3 %
Lymphs Abs: 0.4 10*3/uL — ABNORMAL LOW (ref 0.7–4.0)
MCH: 29.1 pg (ref 26.0–34.0)
MCHC: 31.4 g/dL (ref 30.0–36.0)
MCV: 92.7 fL (ref 80.0–100.0)
Monocytes Absolute: 1 10*3/uL (ref 0.1–1.0)
Monocytes Relative: 8 %
Neutro Abs: 11.4 10*3/uL — ABNORMAL HIGH (ref 1.7–7.7)
Neutrophils Relative %: 87 %
Platelets: 197 10*3/uL (ref 150–400)
RBC: 3.57 MIL/uL — ABNORMAL LOW (ref 4.22–5.81)
RDW: 15.2 % (ref 11.5–15.5)
WBC: 13 10*3/uL — ABNORMAL HIGH (ref 4.0–10.5)
nRBC: 0 % (ref 0.0–0.2)

## 2018-12-14 LAB — SARS CORONAVIRUS 2 BY RT PCR (HOSPITAL ORDER, PERFORMED IN ~~LOC~~ HOSPITAL LAB): SARS Coronavirus 2: NEGATIVE

## 2018-12-14 LAB — PREALBUMIN: Prealbumin: 14.8 mg/dL — ABNORMAL LOW (ref 18–38)

## 2018-12-14 LAB — CREATININE, SERUM
Creatinine, Ser: 5.18 mg/dL — ABNORMAL HIGH (ref 0.61–1.24)
GFR calc Af Amer: 12 mL/min — ABNORMAL LOW (ref >60)
GFR calc non Af Amer: 10 mL/min — ABNORMAL LOW (ref >60)

## 2018-12-14 LAB — C-REACTIVE PROTEIN: CRP: 32.1 mg/dL — ABNORMAL HIGH (ref <1.0)

## 2018-12-14 LAB — GLUCOSE, CAPILLARY
Glucose-Capillary: 213 mg/dL — ABNORMAL HIGH (ref 70–99)
Glucose-Capillary: 199 mg/dL — ABNORMAL HIGH (ref 70–99)

## 2018-12-14 LAB — LACTIC ACID, PLASMA
Lactic Acid, Venous: 1.7 mmol/L (ref 0.5–1.9)
Lactic Acid, Venous: 1.1 mmol/L (ref 0.5–1.9)

## 2018-12-14 LAB — SEDIMENTATION RATE: Sed Rate: 131 mm/hr — ABNORMAL HIGH (ref 0–16)

## 2018-12-14 LAB — SURGICAL PCR SCREEN
MRSA, PCR: NEGATIVE
Staphylococcus aureus: NEGATIVE

## 2018-12-14 MED ORDER — PROBIOTIC PO CAPS: ORAL_CAPSULE | Freq: Every morning | ORAL | Status: DC

## 2018-12-14 MED ORDER — PREGABALIN 100 MG PO CAPS
300.0000 mg | ORAL_CAPSULE | Freq: Every day | ORAL | Status: DC
  Administered 2018-12-14 – 2018-12-16 (×3): 300 mg via ORAL
  Filled 2018-12-14 (×3): qty 3

## 2018-12-14 MED ORDER — ACETAMINOPHEN 325 MG PO TABS: 650.0000 mg | ORAL_TABLET | Freq: Four times a day (QID) | ORAL | Status: DC | PRN

## 2018-12-14 MED ORDER — ONDANSETRON HCL 4 MG/2ML IJ SOLN
4.0000 mg | Freq: Once | INTRAMUSCULAR | Status: AC
  Administered 2018-12-14: 4 mg via INTRAVENOUS
  Filled 2018-12-14: qty 2

## 2018-12-14 MED ORDER — HYDROCODONE-ACETAMINOPHEN 5-325 MG PO TABS
1.0000 | ORAL_TABLET | ORAL | Status: DC | PRN
  Administered 2018-12-14 – 2018-12-15 (×3): 2 via ORAL
  Administered 2018-12-15: 14:00:00 1 via ORAL
  Administered 2018-12-16 – 2018-12-17 (×3): 2 via ORAL
  Filled 2018-12-14 (×2): qty 2
  Filled 2018-12-14: qty 1
  Filled 2018-12-14 (×4): qty 2

## 2018-12-14 MED ORDER — DOCUSATE SODIUM 100 MG PO CAPS
100.0000 mg | ORAL_CAPSULE | Freq: Two times a day (BID) | ORAL | Status: DC
  Administered 2018-12-14: 100 mg via ORAL
  Filled 2018-12-14: qty 1

## 2018-12-14 MED ORDER — MUPIROCIN 2 % EX OINT
1.0000 "application " | TOPICAL_OINTMENT | Freq: Two times a day (BID) | CUTANEOUS | Status: DC
  Administered 2018-12-14 – 2018-12-18 (×8): 1 via NASAL
  Filled 2018-12-14: qty 22

## 2018-12-14 MED ORDER — ROSUVASTATIN CALCIUM 5 MG PO TABS
10.0000 mg | ORAL_TABLET | Freq: Every day | ORAL | Status: DC
  Administered 2018-12-14 – 2018-12-18 (×4): 10 mg via ORAL
  Filled 2018-12-14 (×4): qty 2

## 2018-12-14 MED ORDER — ONDANSETRON HCL 4 MG PO TABS: 4.0000 mg | ORAL_TABLET | Freq: Four times a day (QID) | ORAL | Status: DC | PRN

## 2018-12-14 MED ORDER — FENOFIBRATE 160 MG PO TABS
160.0000 mg | ORAL_TABLET | Freq: Every day | ORAL | Status: DC
  Administered 2018-12-14 – 2018-12-17 (×4): 160 mg via ORAL
  Filled 2018-12-14 (×4): qty 1

## 2018-12-14 MED ORDER — BACID PO TABS
2.0000 | ORAL_TABLET | Freq: Every day | ORAL | Status: DC
  Administered 2018-12-14 – 2018-12-18 (×4): 2 via ORAL
  Filled 2018-12-14 (×4): qty 2

## 2018-12-14 MED ORDER — SODIUM CHLORIDE 0.9 % IV SOLN
1.0000 g | INTRAVENOUS | Status: DC
  Administered 2018-12-15 – 2018-12-17 (×3): 1 g via INTRAVENOUS
  Filled 2018-12-14 (×6): qty 1

## 2018-12-14 MED ORDER — LEVOTHYROXINE SODIUM 112 MCG PO TABS
112.0000 ug | ORAL_TABLET | Freq: Every day | ORAL | Status: DC
  Administered 2018-12-15 – 2018-12-18 (×4): 112 ug via ORAL
  Filled 2018-12-14 (×4): qty 1

## 2018-12-14 MED ORDER — INSULIN ASPART 100 UNIT/ML ~~LOC~~ SOLN
0.0000 [IU] | Freq: Three times a day (TID) | SUBCUTANEOUS | Status: DC
  Administered 2018-12-14 – 2018-12-15 (×2): 3 [IU] via SUBCUTANEOUS
  Administered 2018-12-15: 2 [IU] via SUBCUTANEOUS
  Administered 2018-12-16: 3 [IU] via SUBCUTANEOUS
  Administered 2018-12-16: 2 [IU] via SUBCUTANEOUS
  Administered 2018-12-16: 1 [IU] via SUBCUTANEOUS
  Administered 2018-12-17: 2 [IU] via SUBCUTANEOUS
  Administered 2018-12-17: 1 [IU] via SUBCUTANEOUS

## 2018-12-14 MED ORDER — VANCOMYCIN HCL 10 G IV SOLR
2000.0000 mg | Freq: Once | INTRAVENOUS | Status: AC
  Administered 2018-12-14: 2000 mg via INTRAVENOUS
  Filled 2018-12-14: qty 2000

## 2018-12-14 MED ORDER — ACETAMINOPHEN 325 MG PO TABS
650.0000 mg | ORAL_TABLET | Freq: Once | ORAL | Status: AC
  Administered 2018-12-14: 650 mg via ORAL
  Filled 2018-12-14: qty 2

## 2018-12-14 MED ORDER — INSULIN DEGLUDEC 200 UNIT/ML ~~LOC~~ SOPN: 24.0000 [IU] | PEN_INJECTOR | Freq: Every day | SUBCUTANEOUS | Status: DC

## 2018-12-14 MED ORDER — EZETIMIBE 10 MG PO TABS: 10.0000 mg | ORAL_TABLET | Freq: Every day | ORAL | Status: DC

## 2018-12-14 MED ORDER — VANCOMYCIN VARIABLE DOSE PER UNSTABLE RENAL FUNCTION (PHARMACIST DOSING): Status: DC

## 2018-12-14 MED ORDER — INSULIN ASPART 100 UNIT/ML ~~LOC~~ SOLN: 0.0000 [IU] | Freq: Every day | SUBCUTANEOUS | Status: DC

## 2018-12-14 MED ORDER — HEPARIN SODIUM (PORCINE) 5000 UNIT/ML IJ SOLN
5000.0000 [IU] | Freq: Three times a day (TID) | INTRAMUSCULAR | Status: DC
  Administered 2018-12-14 – 2018-12-18 (×11): 5000 [IU] via SUBCUTANEOUS
  Filled 2018-12-14 (×11): qty 1

## 2018-12-14 MED ORDER — ASPIRIN EC 81 MG PO TBEC
81.0000 mg | DELAYED_RELEASE_TABLET | Freq: Every day | ORAL | Status: DC
  Administered 2018-12-14 – 2018-12-17 (×4): 81 mg via ORAL
  Filled 2018-12-14 (×4): qty 1

## 2018-12-14 MED ORDER — ONDANSETRON HCL 4 MG/2ML IJ SOLN: 4.0000 mg | Freq: Four times a day (QID) | INTRAMUSCULAR | Status: DC | PRN

## 2018-12-14 MED ORDER — CARVEDILOL 25 MG PO TABS
25.0000 mg | ORAL_TABLET | Freq: Two times a day (BID) | ORAL | Status: DC
  Administered 2018-12-14 – 2018-12-18 (×8): 25 mg via ORAL
  Filled 2018-12-14 (×5): qty 1
  Filled 2018-12-14: qty 2
  Filled 2018-12-14 (×2): qty 1

## 2018-12-14 MED ORDER — DULOXETINE HCL 60 MG PO CPEP
60.0000 mg | ORAL_CAPSULE | Freq: Two times a day (BID) | ORAL | Status: DC
  Administered 2018-12-14 – 2018-12-18 (×7): 60 mg via ORAL
  Filled 2018-12-14 (×7): qty 1

## 2018-12-14 MED ORDER — SODIUM CHLORIDE 0.9 % IV SOLN
INTRAVENOUS | Status: DC
  Administered 2018-12-14: 17:00:00 800 mL via INTRAVENOUS
  Administered 2018-12-15: 02:00:00 via INTRAVENOUS

## 2018-12-14 MED ORDER — ACETAMINOPHEN 650 MG RE SUPP: 650.0000 mg | Freq: Four times a day (QID) | RECTAL | Status: DC | PRN

## 2018-12-14 MED ORDER — INSULIN GLARGINE 100 UNIT/ML ~~LOC~~ SOLN
24.0000 [IU] | Freq: Every day | SUBCUTANEOUS | Status: DC
  Administered 2018-12-14 – 2018-12-17 (×4): 24 [IU] via SUBCUTANEOUS
  Filled 2018-12-14 (×5): qty 0.24

## 2018-12-14 MED ORDER — SODIUM CHLORIDE 0.9 % IV SOLN
Freq: Once | INTRAVENOUS | Status: AC
  Administered 2018-12-14: 14:00:00 via INTRAVENOUS

## 2018-12-14 MED ORDER — MORPHINE SULFATE (PF) 4 MG/ML IV SOLN
4.0000 mg | Freq: Once | INTRAVENOUS | Status: AC
  Administered 2018-12-14: 4 mg via INTRAVENOUS
  Filled 2018-12-14: qty 1

## 2018-12-14 MED ORDER — SODIUM CHLORIDE 0.9% FLUSH: 3.0000 mL | Freq: Once | INTRAVENOUS | Status: DC

## 2018-12-14 MED ORDER — METRONIDAZOLE 500 MG PO TABS
500.0000 mg | ORAL_TABLET | Freq: Three times a day (TID) | ORAL | Status: DC
  Administered 2018-12-14 – 2018-12-18 (×11): 500 mg via ORAL
  Filled 2018-12-14 (×11): qty 1

## 2018-12-14 NOTE — ED Provider Notes (Signed)
[facility] EMERGENCY DEPARTMENT Provider Note   CSN: [id number] Arrival date & time: 12/14/18  1147     History   Chief Complaint Chief Complaint  Patient presents with  . Recurrent Skin Infections    HPI [name] is a 68 y.o. male with history of CAD, diabetes, CKD, hypertension, neuropathy, prostate cancer presents today with wound of the right foot.  Patient injured his right foot 6 days ago and over the past 5 days has developed drainage, pain swelling and erythema of the area.  He describes a moderate intensity throbbing sensation constant worsened with palpation and movement and without alleviating factors.  He reports fever at home up to 102 F.  He has been using doxycycline left over from previous infection twice daily for the last 3 days without improvement of his symptoms.  Denies headache/vision change, chest pain/shortness of breath, abdominal pain, nausea/vomiting, diarrhea or any additional concerns today.     HPI  Past Medical History:  Diagnosis Date  . Cervical myelopathy ([place]) 02/06/2018  . Coronary artery disease   . Diabetes mellitus without complication ([place])   . Diabetic neuropathy ([place]) 02/06/2018  . Gait abnormality 08/24/2018  . Hypercholesteremia   . Hypertension   . Neuropathy   . Prostate cancer [facility])     Patient Active Problem List   Diagnosis Date Noted  . Osteomyelitis ([place]) 12/14/2018  . Gait abnormality 08/24/2018  . Diabetic neuropathy ([place]) 02/06/2018  . Cervical myelopathy ([employer]) 02/06/2018  . Onychomycosis 10/30/2017  . Other spondylosis with radiculopathy, cervical region 01/27/2017  . Midfoot ulcer, left, limited to breakdown of skin ([place]) 11/15/2016  . Lateral epicondylitis, left elbow 08/12/2016  . Cellulitis of fifth toe of right foot 07/29/2016  . Prostate cancer ([place]) 06/19/2016  . Non-pressure chronic ulcer of other part of right foot limited to breakdown of skin ([place]) 04/04/2016  . Cellulitis of leg,  right 12/24/2015  . Cellulitis of right lower extremity 12/24/2015  . CKD (chronic kidney disease), stage III ([place]) 12/25/2013  . Foot infection 12/24/2013  . Foot ulcer, left ([place]) 12/24/2013  . Diabetic foot ulcer ([place]) 09/14/2012  . Gout 09/14/2012  . HTN (hypertension) 09/14/2012  . Hypothyroidism 09/14/2012  . CAD (coronary artery disease) 09/14/2012  . Diabetes mellitus ([place]) 09/14/2012  . Hypercholesteremia   . Neuropathy [facility])     Past Surgical History:  Procedure Laterality Date  . AMPUTATION Left 12/25/2013   Procedure: AMPUTATION RAY LEFT 5TH RAY;  Surgeon: [name], MD;  Location: [facility] ORS;  Service: Orthopedics;  Laterality: Left;  . BACK SURGERY    . CHOLECYSTECTOMY    . CORONARY ARTERY BYPASS GRAFT    . NECK SURGERY     novemver 2019        Home Medications    Prior to Admission medications   Medication Sig Start Date End Date Taking? Authorizing Provider  allopurinol (ZYLOPRIM) 100 MG tablet  05/04/18   [provider]  amLODipine (NORVASC) 5 MG tablet  03/26/18   [provider]  aspirin EC 81 MG tablet Take 81 mg by mouth at bedtime.     [provider]  calcitRIOL (ROCALTROL) 0.25 MCG capsule  03/22/18   [provider]  carvedilol (COREG) 25 MG tablet Take 25 mg by mouth 2 (two) times daily with a meal.  11/22/16   [provider]  diclofenac sodium (VOLTAREN) 1 % GEL Apply 1 application topically daily.    [provider]  doxycycline (VIBRA-TABS) 100 MG tablet TAKE 1 TABLET BY MOUTH TWICE A DAY 11/17/18   Suzan Slick, NP  DULoxetine (CYMBALTA) 60 MG capsule Take 1 capsule (60 mg total) by mouth 2 (two) times daily. 08/24/18   Kathrynn Ducking, MD  Esomeprazole Magnesium (NEXIUM PO) Take 22.3 mg by mouth as needed.     [provider]  ezetimibe (ZETIA) 10 MG tablet Take 10 mg by mouth daily.    [provider]  Insulin Degludec (TRESIBA FLEXTOUCH) 200 UNIT/ML SOPN Inject 40 Units  into the skin at bedtime. Patient taking differently: Inject 24 Units into the skin at bedtime.  12/27/15   Domenic Polite, MD  levothyroxine (SYNTHROID, LEVOTHROID) 112 MCG tablet Take 112 mcg by mouth daily before breakfast.    [provider]  LYRICA 150 MG capsule Take 1 capsule (150 mg total) by mouth at bedtime. Patient taking differently: Take 300 mg by mouth at bedtime.  02/06/18   Kathrynn Ducking, MD  OVER THE COUNTER MEDICATION Take 1 tablet by mouth See admin instructions. Take 1 tablet by mouth every 2 hours during the night as needed for leg cramps: Hyland's Leg Cramps PM    [provider]  OZEMPIC, 1 MG/DOSE, 2 MG/1.5ML SOPN Inject into the skin once a week.  05/15/18   [provider]  predniSONE (DELTASONE) 10 MG tablet TAKE 2 TABLETS (20 MG TOTAL) BY MOUTH DAILY WITH BREAKFAST. 11/03/18   [name], MD  rosuvastatin (CRESTOR) 10 MG tablet Take 10 mg by mouth daily.    [provider]  tadalafil (CIALIS) 5 MG tablet Take 5 mg by mouth daily as needed for erectile dysfunction.    [provider]  triamcinolone (KENALOG) 0.025 % cream Apply 1 application topically daily as needed (psoriasis).    [provider]    Family History Family History  Problem Relation Age of Onset  . Diabetes Mellitus II Mother   . Diabetes Mellitus II Father   . CAD Father   . Cancer Father        prostate  . Diabetes Mellitus II Brother   . Diabetes Mellitus II Brother   . Cancer Brother     Social History Social History   Tobacco Use  . Smoking status: Former Smoker    Types: Cigars    Quit date: 12/24/1988    Years since quitting: 29.9  . Smokeless tobacco: Never Used  Substance Use Topics  . Alcohol use: No  . Drug use: No     Allergies   Mushroom extract complex   Review of Systems Review of Systems Ten systems are reviewed and are negative for acute change except as noted in the HPI   Physical Exam Updated Vital  Signs BP (!) 150/80 (BP Location: Right Arm)   Pulse (!) 109   Temp 100.1 F (37.8 C) (Oral)   Resp 16   Ht 5\' 10"  (1.778 m)   Wt 99.8 kg   SpO2 100%   BMI 31.57 kg/m   Physical Exam Constitutional:      General: He is not in acute distress.    Appearance: Normal appearance. He is well-developed. He is not ill-appearing or diaphoretic.  HENT:     Head: Normocephalic and atraumatic.     Right Ear: External ear normal.     Left Ear: External ear normal.     Nose: Nose normal.  Eyes:     General: Vision grossly intact. Gaze aligned  appropriately.     Pupils: Pupils are equal, round, and reactive to light.  Neck:     Musculoskeletal: Normal range of motion.     Trachea: Trachea and phonation normal. No tracheal deviation.  Cardiovascular:     Pulses:          Dorsalis pedis pulses are 2+ on the right side and 2+ on the left side.  Pulmonary:     Effort: Pulmonary effort is normal. No respiratory distress.  Abdominal:     General: There is no distension.     Palpations: Abdomen is soft.     Tenderness: There is no abdominal tenderness. There is no guarding or rebound.  Musculoskeletal: Normal range of motion.       Feet:  Feet:     Right foot:     Protective Sensation: 3 sites tested. 3 sites sensed.     Left foot:     Protective Sensation: 3 sites tested. 3 sites sensed.     Comments: Patient with wound of the right lateral foot at the base of the fifth toe.  Necrotic tissue present in the center.  Surrounding erythema/streaking extending up the midfoot, ankle and right shin.  Tender to palpation.  Capillary refill and sensation intact to all 5 toes.  Pedal pulses intact and equal bilaterally.  See picture below Skin:    General: Skin is warm and dry.  Neurological:     Mental Status: He is alert.     GCS: GCS eye subscore is 4. GCS verbal subscore is 5. GCS motor subscore is 6.     Comments: Speech is clear and goal oriented, follows commands Major Cranial nerves  without deficit, no facial droop Moves extremities without ataxia, coordination intact  Psychiatric:        Behavior: Behavior normal.            ED Treatments / Results  Labs (all labs ordered are listed, but only abnormal results are displayed) Labs Reviewed  COMPREHENSIVE METABOLIC PANEL - Abnormal; Notable for the following components:      Result Value   Sodium 134 (*)    CO2 16 (*)    Glucose, Bld 237 (*)    BUN 58 (*)    Creatinine, Ser 5.14 (*)    Calcium 8.5 (*)    Albumin 3.0 (*)    GFR calc non Af Amer 11 (*)    GFR calc Af Amer 12 (*)    All other components within normal limits  CBC WITH DIFFERENTIAL/PLATELET - Abnormal; Notable for the following components:   WBC 13.0 (*)    RBC 3.57 (*)    Hemoglobin 10.4 (*)    HCT 33.1 (*)    Neutro Abs 11.4 (*)    Lymphs Abs 0.4 (*)    Abs Immature Granulocytes 0.19 (*)    All other components within normal limits  CULTURE, BLOOD (ROUTINE X 2)  CULTURE, BLOOD (ROUTINE X 2)  AEROBIC CULTURE (SUPERFICIAL SPECIMEN)  SARS CORONAVIRUS 2 (HOSPITAL ORDER, [facility])  LACTIC ACID, PLASMA  LACTIC ACID, PLASMA  URINALYSIS, ROUTINE W REFLEX MICROSCOPIC    EKG None  Radiology Dg Chest 2 View  Result Date: 12/14/2018 CLINICAL DATA:  Fever. EXAM: CHEST - 2 VIEW COMPARISON:  December 24, 2015 FINDINGS: Stable postsurgical changes. Cardiomediastinal silhouette is normal. Mediastinal contours appear intact. There is no evidence of focal airspace consolidation, pleural effusion or pneumothorax. Osseous structures are without acute abnormality. Soft tissues  are grossly normal. IMPRESSION: No active cardiopulmonary disease. Electronically Signed   By: Fidela Salisbury M.D.   On: 12/14/2018 13:11   Dg Foot Complete Right  Result Date: 12/14/2018 CLINICAL DATA:  Infection fifth toe EXAM: RIGHT FOOT COMPLETE - 3+ VIEW COMPARISON:  None. FINDINGS: Soft tissue swelling and extensive soft tissue air the  level of the fifth toe and metatarsal head. Loss cortical distinctness of the medial aspect of the fifth metatarsal head is suspicious for acute osteomyelitis. No fracture identified. No dislocation. Mild degenerative changes within the midfoot. Prominent calcaneal enthesophyte. Vascular calcifications. IMPRESSION: 1. Findings suspicious for osteomyelitis of the fifth metatarsal head. 2. Soft tissue swelling with air within the soft tissues at the level of the fifth digit and fifth metatarsal head suggesting ulceration. No soft tissue gas is seen tracking proximally within the forefoot or hindfoot. Electronically Signed   By: Davina Poke M.D.   On: 12/14/2018 13:14    Procedures Procedures (including critical care time)  Medications Ordered in ED Medications  sodium chloride flush (NS) 0.9 % injection 3 mL (has no administration in time range)  vancomycin (VANCOCIN) 2,000 mg in sodium chloride 0.9 % 500 mL IVPB (has no administration in time range)  vancomycin variable dose per unstable renal function (pharmacist dosing) (has no administration in time range)  morphine 4 MG/ML injection 4 mg (4 mg Intravenous Given 12/14/18 1415)  acetaminophen (TYLENOL) tablet 650 mg (650 mg Oral Given 12/14/18 1415)  0.9 %  sodium chloride infusion ( Intravenous New Bag/Given 12/14/18 1415)  ondansetron (ZOFRAN) injection 4 mg (4 mg Intravenous Given 12/14/18 1414)     Initial Impression / Assessment and Plan / ED Course  I have reviewed the triage vital signs and the nursing notes.  Pertinent labs & imaging results that were available during my care of the patient were reviewed by me and considered in my medical decision making (see chart for details).  Clinical Course as of Dec 13 1437  Mon Dec 14, 2018  1358 Hospitalist   [BM]    Clinical Course User Index [BM] Deliah Boston, PA-C   CBC with leukocytosis of 13.0 with left shift CMP with creatinine 5.14, appears as patient with AKI today baseline  creatinine looks to be approximately 2-1/2- Blood cultures, IV fluids, pain medication, Tylenol ordered.  X-ray of the right foot ordered.  Lactic pending.  Patient will need admission. - CXR:    IMPRESSION:  No active cardiopulmonary disease.   DG Right Foot:  IMPRESSION:  1. Findings suspicious for osteomyelitis of the fifth metatarsal  head.  2. Soft tissue swelling with air within the soft tissues at the  level of the fifth digit and fifth metatarsal head suggesting  ulceration. No soft tissue gas is seen tracking proximally within  the forefoot or hindfoot.   Discussed case with orthopedic surgeon Dr. Ninfa Linden who advises medicine admission at this time.  IV vancomycin.  N.p.o. after midnight.  Dr. [name] to see patient tomorrow morning. - Wound culture collected, COVID-19 screening collected, pain medication, IV fluid bolus, Zofran and vancomycin ordered.  Patient updated on care plan is agreeable for admission at this time.  He has no further questions or concerns.  On reevaluation he is resting comfortably in no acute distress. - Patient seen and evaluated by Dr. Maryan Rued during this visit, patient reports new nonproductive cough, will send rapid COVID testing. He is not any respiratory distress.    Note: Portions of this report may have  been transcribed using voice recognition software. Every effort was made to ensure accuracy; however, inadvertent computerized transcription errors may still be present. Final Clinical Impressions(s) / ED Diagnoses   Final diagnoses:  Osteomyelitis of right foot, unspecified type [facility])    ED Discharge Orders    None       Gari Crown 12/14/18 1441    Blanchie Dessert, MD 12/14/18 2106

## 2018-12-14 NOTE — H&P (Signed)
History and Physical    [name] CWC:[id number] DOB: 1950/12/09 DOA: 12/14/2018  PCP: Fanny Bien, MD  Patient coming from: home  I have personally briefly reviewed patient's old medical records in [place]  Chief Complaint: Right foot wound  HPI: [name] is a 68 y.o. male with medical history significant of hypertension, diabetes, chronic right foot wound under the care of Dr. [name], chronic kidney disease stage IV under the care of Dr. Hollie Salk.  Patient reports that for the past 5 to 6 days and his wife have noticed developing erythema around his chronic right foot wound.  This is been draining dark/purulent material.  He is noticed fevers for the past 3 days.  He says wound is mildly tender.  Reports urine output has been normal for him.  He has not had any vomiting or diarrhea.  Does report cough and shortness of breath for the last 3 days, although he is not had any since he is come to the hospital.  No chest pain.  ED Course: He was evaluated the emergency room where plain film x-rays showed concern for underlying osteomyelitis of his right fifth metatarsal.  Labs showed elevated WBC count as well as elevated creatinine from baseline.  Case was reviewed with orthopedics who felt that he would likely need operative management and has requested medical admission.  Review of Systems:  General: Positive for fever, generalized weakness Respiratory: Positive for occasional shortness of breath and cough Cardiac: Negative for chest pain, palpitations Abdomen: Negative for abdominal pain, vomiting, diarrhea Skin: Positive for chronic ulcer of right foot. Remainder of systems reviewed and found to be negative.   Past Medical History:  Diagnosis Date  . Cervical myelopathy ([place]) 02/06/2018  . Coronary artery disease   . Diabetes mellitus without complication ([place])   . Diabetic neuropathy ([place]) 02/06/2018  . Gait abnormality 08/24/2018  . Hypercholesteremia   . Hypertension   .  Neuropathy   . Prostate cancer [facility])     Past Surgical History:  Procedure Laterality Date  . AMPUTATION Left 12/25/2013   Procedure: AMPUTATION RAY LEFT 5TH RAY;  Surgeon: [name], MD;  Location: [facility];  Service: Orthopedics;  Laterality: Left;  . BACK SURGERY    . CHOLECYSTECTOMY    . CORONARY ARTERY BYPASS GRAFT    . NECK SURGERY     novemver 2019    Social History:  reports that he quit smoking about 29 years ago. His smoking use included cigars. He has never used smokeless tobacco. He reports that he does not drink alcohol or use drugs.  Allergies  Allergen Reactions  . Mushroom Extract Complex Nausea Only    Family History  Problem Relation Age of Onset  . Diabetes Mellitus II Mother   . Diabetes Mellitus II Father   . CAD Father   . Cancer Father        prostate  . Diabetes Mellitus II Brother   . Diabetes Mellitus II Brother   . Cancer Brother     Prior to Admission medications   Medication Sig Start Date End Date Taking? Authorizing Provider  allopurinol (ZYLOPRIM) 100 MG tablet  05/04/18   [provider]  amLODipine (NORVASC) 5 MG tablet  03/26/18   [provider]  aspirin EC 81 MG tablet Take 81 mg by mouth at bedtime.     [provider]  calcitRIOL (ROCALTROL) 0.25 MCG capsule  03/22/18   [provider]  carvedilol (COREG) 25  MG tablet Take 25 mg by mouth 2 (two) times daily with a meal.  11/22/16   [provider]  diclofenac sodium (VOLTAREN) 1 % GEL Apply 1 application topically daily.    [provider]  doxycycline (VIBRA-TABS) 100 MG tablet TAKE 1 TABLET BY MOUTH TWICE A DAY 11/17/18   Suzan Slick, NP  DULoxetine (CYMBALTA) 60 MG capsule Take 1 capsule (60 mg total) by mouth 2 (two) times daily. 08/24/18   Kathrynn Ducking, MD  Esomeprazole Magnesium (NEXIUM PO) Take 22.3 mg by mouth as needed.     [provider]  ezetimibe (ZETIA) 10 MG tablet Take 10 mg by mouth daily.     [provider]  fenofibrate 160 MG tablet Take 160 mg by mouth at bedtime. 10/26/18   [provider]  Insulin Degludec (TRESIBA FLEXTOUCH) 200 UNIT/ML SOPN Inject 40 Units into the skin at bedtime. Patient taking differently: Inject 24 Units into the skin at bedtime.  12/27/15   Domenic Polite, MD  levothyroxine (SYNTHROID, LEVOTHROID) 112 MCG tablet Take 112 mcg by mouth daily before breakfast.    [provider]  LYRICA 150 MG capsule Take 1 capsule (150 mg total) by mouth at bedtime. Patient taking differently: Take 300 mg by mouth at bedtime.  02/06/18   Kathrynn Ducking, MD  OVER THE COUNTER MEDICATION Take 1 tablet by mouth See admin instructions. Take 1 tablet by mouth every 2 hours during the night as needed for leg cramps: Hyland's Leg Cramps PM    [provider]  predniSONE (DELTASONE) 10 MG tablet TAKE 2 TABLETS (20 MG TOTAL) BY MOUTH DAILY WITH BREAKFAST. 11/03/18   [name], MD  rosuvastatin (CRESTOR) 10 MG tablet Take 10 mg by mouth daily.    [provider]  triamcinolone (KENALOG) 0.025 % cream Apply 1 application topically daily as needed (psoriasis).    [provider]    Physical Exam: Vitals:   12/14/18 1153 12/14/18 1154 12/14/18 1245  BP: (!) 150/80  (!) 138/110  Pulse: (!) 109  (!) 102  Resp: 16  20  Temp: 100.1 F (37.8 C)    TempSrc: Oral    SpO2: 100%  100%  Weight:  99.8 kg   Height:  5\' 10"  (1.778 m)     Constitutional: NAD, calm, comfortable Eyes: PERRL, lids and conjunctivae normal ENMT: Mucous membranes are moist. Posterior pharynx clear of any exudate or lesions.Normal dentition.  Neck: normal, supple, no masses, no thyromegaly Respiratory: clear to auscultation bilaterally, no wheezing, no crackles. Normal respiratory effort. No accessory muscle use.  Cardiovascular: Regular rate and rhythm, no murmurs / rubs / gallops. No extremity edema. 2+ pedal pulses. No carotid bruits.  Abdomen: no  tenderness, no masses palpated. No hepatosplenomegaly. Bowel sounds positive.  Musculoskeletal: no clubbing / cyanosis. No joint deformity upper and lower extremities. Good ROM, no contractures. Normal muscle tone.  Skin: Large ulcer on lateral aspect of fifth metatarsal with surrounding cellulitis.  Please refer to pictures and EDP note. Neurologic: CN 2-12 grossly intact. Sensation intact, DTR normal. Strength 5/5 in all 4.  Psychiatric: Normal judgment and insight. Alert and oriented x 3. Normal mood.    Labs on Admission: I have personally reviewed following labs and imaging studies  CBC: Recent Labs  Lab 12/14/18 1207  WBC 13.0*  NEUTROABS 11.4*  HGB 10.4*  HCT 33.1*  MCV 92.7  PLT 791   Basic Metabolic Panel: Recent Labs  Lab 12/14/18 1207  NA 134*  K 4.5  CL 103  CO2 16*  GLUCOSE 237*  BUN 58*  CREATININE 5.14*  CALCIUM 8.5*   GFR: Estimated Creatinine Clearance: 16.1 mL/min (A) (by C-G formula based on SCr of 5.14 mg/dL (H)). Liver Function Tests: Recent Labs  Lab 12/14/18 1207  AST 27  ALT 25  ALKPHOS 46  BILITOT 0.6  PROT 6.7  ALBUMIN 3.0*   No results for input(s): LIPASE, AMYLASE in the last 168 hours. No results for input(s): AMMONIA in the last 168 hours. Coagulation Profile: No results for input(s): INR, PROTIME in the last 168 hours. Cardiac Enzymes: No results for input(s): CKTOTAL, CKMB, CKMBINDEX, TROPONINI in the last 168 hours. BNP (last 3 results) No results for input(s): PROBNP in the last 8760 hours. HbA1C: No results for input(s): HGBA1C in the last 72 hours. CBG: No results for input(s): GLUCAP in the last 168 hours. Lipid Profile: No results for input(s): CHOL, HDL, LDLCALC, TRIG, CHOLHDL, LDLDIRECT in the last 72 hours. Thyroid Function Tests: No results for input(s): TSH, T4TOTAL, FREET4, T3FREE, THYROIDAB in the last 72 hours. Anemia Panel: No results for input(s): VITAMINB12, FOLATE, FERRITIN, TIBC, IRON, RETICCTPCT in the  last 72 hours. Urine analysis:    Component Value Date/Time   COLORURINE YELLOW 12/14/2018 1506   APPEARANCEUR HAZY (A) 12/14/2018 1506   LABSPEC 1.019 12/14/2018 1506   PHURINE 5.0 12/14/2018 1506   GLUCOSEU 50 (A) 12/14/2018 1506   HGBUR MODERATE (A) 12/14/2018 1506   BILIRUBINUR NEGATIVE 12/14/2018 1506   KETONESUR NEGATIVE 12/14/2018 1506   PROTEINUR >=300 (A) 12/14/2018 1506   UROBILINOGEN 0.2 12/28/2014 1031   NITRITE NEGATIVE 12/14/2018 1506   LEUKOCYTESUR NEGATIVE 12/14/2018 1506    Radiological Exams on Admission: Dg Chest 2 View  Result Date: 12/14/2018 CLINICAL DATA:  Fever. EXAM: CHEST - 2 VIEW COMPARISON:  December 24, 2015 FINDINGS: Stable postsurgical changes. Cardiomediastinal silhouette is normal. Mediastinal contours appear intact. There is no evidence of focal airspace consolidation, pleural effusion or pneumothorax. Osseous structures are without acute abnormality. Soft tissues are grossly normal. IMPRESSION: No active cardiopulmonary disease. Electronically Signed   By: Fidela Salisbury M.D.   On: 12/14/2018 13:11   Dg Foot Complete Right  Result Date: 12/14/2018 CLINICAL DATA:  Infection fifth toe EXAM: RIGHT FOOT COMPLETE - 3+ VIEW COMPARISON:  None. FINDINGS: Soft tissue swelling and extensive soft tissue air the level of the fifth toe and metatarsal head. Loss cortical distinctness of the medial aspect of the fifth metatarsal head is suspicious for acute osteomyelitis. No fracture identified. No dislocation. Mild degenerative changes within the midfoot. Prominent calcaneal enthesophyte. Vascular calcifications. IMPRESSION: 1. Findings suspicious for osteomyelitis of the fifth metatarsal head. 2. Soft tissue swelling with air within the soft tissues at the level of the fifth digit and fifth metatarsal head suggesting ulceration. No soft tissue gas is seen tracking proximally within the forefoot or hindfoot. Electronically Signed   By: Davina Poke M.D.   On:  12/14/2018 13:14    Assessment/Plan Active Problems:   Diabetic foot ulcer (HCC)   HTN (hypertension)   Hypothyroidism   Diabetes mellitus ([place])   Osteomyelitis (HCC)   AKI (acute kidney injury) ([place])   CKD (chronic kidney disease) stage 4, GFR 15-29 ml/min ([place])     1. Osteomyelitis of right foot.  Patient has been started on a treatment antibiotics per lower extremity wound doorstep.  MRI has been ordered to further evaluate extent of infection.  Orthopedics following and plans  on operative management on 9/8.  He will be kept n.p.o. after midnight. 2. Acute kidney injury on chronic kidney disease stage IV.  Case reviewed with Dr. Joelyn Oms on-call for nephrology.  Patient's creatinine and 11/2018 was 4.4.  Currently, patient has presented with a creatinine of 5.1.  Will provide hydration and recheck labs in a.m.  Monitor urine output.  If no significant improvement or worsening creatinine, consider formal nephrology consultation. 3. Diabetes.  Continue on basal insulin.  Supplement with sliding scale 4. Hypothyroidism.  Continue Synthroid 5. Hypertension.  He is chronically on amlodipine and Coreg.  Will hold amlodipine for now and continue on home dose of Coreg.  DVT prophylaxis: heparin  Code Status: Full code Family Communication: Discussed with wife at the bedside Disposition Plan: Likely discharge home postprocedure Consults called: Orthopedics, Dr. [name] Admission status: Inpatient, telemetry It is my clinical opinion that admission to INPATIENT is reasonable and necessary because of the expectation that this patient will require hospital care that crosses at least 2 midnights to treat this condition based on the medical complexity of the problems presented and need for operative management.  Given the aforementioned information, the predictability of an adverse outcome is felt to be significant.   Kathie Dike MD [facility]   If 7PM-7AM, please contact night-coverage  [email or web address]   12/14/2018, 3:26 PM

## 2018-12-14 NOTE — Progress Notes (Signed)
Patient ID: [name], male   DOB: 1950-12-12, 68 y.o.   MRN: [id number] I have reviewed the patient's chart and seen pictures of his foot.  He will obviously need some type of surgery on that foot.  I will inform Dr. [name] about the patient.  He will need to be n.p.o. after midnight tonight for surgery tomorrow (9-/8).

## 2018-12-14 NOTE — Consult Note (Signed)
ORTHOPAEDIC CONSULTATION  REQUESTING PHYSICIAN: Kathie Dike, MD  Chief Complaint: Cellulitis abscess foul-smelling draining right foot.  HPI: [name] is a 68 y.o. male who presents with osteomyelitis ulceration abscess right foot fifth metatarsal head.  Patient states that he has had redness cellulitis and foul-smelling drainage for several days.  Past Medical History:  Diagnosis Date  . Cervical myelopathy ([place]) 02/06/2018  . Coronary artery disease   . Diabetes mellitus without complication ([place])   . Diabetic neuropathy ([place]) 02/06/2018  . Gait abnormality 08/24/2018  . Hypercholesteremia   . Hypertension   . Neuropathy   . Prostate cancer [facility])    Past Surgical History:  Procedure Laterality Date  . AMPUTATION Left 12/25/2013   Procedure: AMPUTATION RAY LEFT 5TH RAY;  Surgeon: [name], MD;  Location: [facility];  Service: Orthopedics;  Laterality: Left;  . BACK SURGERY    . CHOLECYSTECTOMY    . CORONARY ARTERY BYPASS GRAFT    . NECK SURGERY     novemver 2019   Social History   Socioeconomic History  . Marital status: Married    Spouse name: Not on file  . Number of children: Not on file  . Years of education: Not on file  . Highest education level: Not on file  Occupational History  . Not on file  Social Needs  . Financial resource strain: Not on file  . Food insecurity    Worry: Not on file    Inability: Not on file  . Transportation needs    Medical: Not on file    Non-medical: Not on file  Tobacco Use  . Smoking status: Former Smoker    Types: Cigars    Quit date: 12/24/1988    Years since quitting: 29.9  . Smokeless tobacco: Never Used  Substance and Sexual Activity  . Alcohol use: No  . Drug use: No  . Sexual activity: Yes    Partners: Female  Lifestyle  . Physical activity    Days per week: Not on file    Minutes per session: Not on file  . Stress: Not on file  Relationships  . Social Herbalist on phone: Not on file   Gets together: Not on file    Attends religious service: Not on file    Active member of club or organization: Not on file    Attends meetings of clubs or organizations: Not on file    Relationship status: Not on file  Other Topics Concern  . Not on file  Social History Narrative   Regular exercise: yes 3 times a week   Caffeine use: hot tea   Family History  Problem Relation Age of Onset  . Diabetes Mellitus II Mother   . Diabetes Mellitus II Father   . CAD Father   . Cancer Father        prostate  . Diabetes Mellitus II Brother   . Diabetes Mellitus II Brother   . Cancer Brother    - negative except otherwise stated in the family history section Allergies  Allergen Reactions  . Mushroom Extract Complex Nausea Only   Prior to Admission medications   Medication Sig Start Date End Date Taking? Authorizing Provider  acetaminophen (TYLENOL) 500 MG tablet Take 1,500 mg by mouth every 6 (six) hours as needed (pain).   Yes [provider]  allopurinol (ZYLOPRIM) 100 MG tablet Take 200 mg by mouth every morning.  05/04/18  Yes [provider]  Alprostadil, Vasodilator, (CAVERJECT IC) 1 Dose by Intracavernosal route daily as needed (erectile dysfunction).   Yes [provider]  amLODipine (NORVASC) 5 MG tablet Take 5 mg by mouth every morning.  03/26/18  Yes [provider]  aspirin EC 81 MG tablet Take 81 mg by mouth every morning.    Yes [provider]  calcitRIOL (ROCALTROL) 0.25 MCG capsule Take 0.25 mcg by mouth every morning.  03/22/18  Yes [provider]  carvedilol (COREG) 25 MG tablet Take 25 mg by mouth 2 (two) times daily.  11/22/16  Yes [provider]  diclofenac sodium (VOLTAREN) 1 % GEL Apply 1 application topically 3 (three) times daily as needed (pain).    Yes [provider]  docusate sodium (STOOL SOFTENER) 100 MG capsule Take 100 mg by mouth 2 (two) times daily.   Yes [provider]   doxycycline (VIBRA-TABS) 100 MG tablet TAKE 1 TABLET BY MOUTH TWICE A DAY Patient taking differently: Take 100 mg by mouth 2 (two) times daily.  11/17/18  Yes Suzan Slick, NP  DULoxetine (CYMBALTA) 60 MG capsule Take 1 capsule (60 mg total) by mouth 2 (two) times daily. 08/24/18  Yes Kathrynn Ducking, MD  fenofibrate 160 MG tablet Take 160 mg by mouth at bedtime. 10/26/18  Yes [provider]  Insulin Degludec (TRESIBA FLEXTOUCH) 200 UNIT/ML SOPN Inject 40 Units into the skin at bedtime. Patient taking differently: Inject 24 Units into the skin at bedtime.  12/27/15  Yes Domenic Polite, MD  levothyroxine (SYNTHROID, LEVOTHROID) 112 MCG tablet Take 112 mcg by mouth daily before breakfast. BRAND NAME SYNTHROID   Yes [provider]  LYRICA 150 MG capsule Take 1 capsule (150 mg total) by mouth at bedtime. Patient taking differently: Take 150 mg by mouth 2 (two) times daily.  02/06/18  Yes Kathrynn Ducking, MD  OVER THE COUNTER MEDICATION Take 1 tablet by mouth 3 (three) times daily as needed (leg cramps). Hyland's Leg Cramps   Yes [provider]  Probiotic Product (PROBIOTIC PO) Take 1 tablet by mouth every morning.   Yes [provider]  rosuvastatin (CRESTOR) 10 MG tablet Take 10 mg by mouth at bedtime.    Yes [provider]  Semaglutide, 1 MG/DOSE, (OZEMPIC, 1 MG/DOSE,) 2 MG/1.5ML SOPN Inject 1 mg into the skin every Friday.   Yes [provider]  triamcinolone (KENALOG) 0.025 % cream Apply 1 application topically daily as needed (psoriasis).   Yes [provider]  predniSONE (DELTASONE) 10 MG tablet TAKE 2 TABLETS (20 MG TOTAL) BY MOUTH DAILY WITH BREAKFAST. Patient not taking: Reported on 12/14/2018 11/03/18   [name], MD   Dg Chest 2 View  Result Date: 12/14/2018 CLINICAL DATA:  Fever. EXAM: CHEST - 2 VIEW COMPARISON:  December 24, 2015 FINDINGS: Stable postsurgical changes. Cardiomediastinal silhouette is normal.  Mediastinal contours appear intact. There is no evidence of focal airspace consolidation, pleural effusion or pneumothorax. Osseous structures are without acute abnormality. Soft tissues are grossly normal. IMPRESSION: No active cardiopulmonary disease. Electronically Signed   By: Fidela Salisbury M.D.   On: 12/14/2018 13:11   Dg Foot Complete Right  Result Date: 12/14/2018 CLINICAL DATA:  Infection fifth toe EXAM: RIGHT FOOT COMPLETE - 3+ VIEW COMPARISON:  None. FINDINGS: Soft tissue swelling and extensive soft tissue air the level of the fifth toe and metatarsal head. Loss cortical distinctness of the medial aspect of the fifth metatarsal head is suspicious for acute osteomyelitis.  No fracture identified. No dislocation. Mild degenerative changes within the midfoot. Prominent calcaneal enthesophyte. Vascular calcifications. IMPRESSION: 1. Findings suspicious for osteomyelitis of the fifth metatarsal head. 2. Soft tissue swelling with air within the soft tissues at the level of the fifth digit and fifth metatarsal head suggesting ulceration. No soft tissue gas is seen tracking proximally within the forefoot or hindfoot. Electronically Signed   By: Davina Poke M.D.   On: 12/14/2018 13:14   - pertinent xrays, CT, MRI studies were reviewed and independently interpreted  Positive ROS: All other systems have been reviewed and were otherwise negative with the exception of those mentioned in the HPI and as above.  Physical Exam: General: Alert, no acute distress Psychiatric: Patient is competent for consent with normal mood and affect Lymphatic: No axillary or cervical lymphadenopathy Cardiovascular: No pedal edema Respiratory: No cyanosis, no use of accessory musculature GI: No organomegaly, abdomen is soft and non-tender    Images:  @ENCIMAGES @  Labs:  Lab Results  Component Value Date   HGBA1C 6.0 (H) 12/24/2015   HGBA1C 7.0 (H) 12/25/2013   HGBA1C 10.0 (H) 09/15/2012   ESRSEDRATE  16 02/06/2018   ESRSEDRATE 53 (H) 09/15/2012   REPTSTATUS 12/29/2015 FINAL 12/24/2015   CULT NO GROWTH 5 DAYS 12/24/2015    Lab Results  Component Value Date   ALBUMIN 3.0 (L) 12/14/2018   ALBUMIN 3.7 12/24/2015   ALBUMIN 3.7 12/28/2014    Neurologic: Patient does not have protective sensation bilateral lower extremities.   MUSCULOSKELETAL:   Skin: Examination patient has cellulitis involving the dorsal half of the right foot.  He has a black eschar over the base of the fifth metatarsal but no full-thickness skin defect.  He has a large necrotic abscess ulcer over the fifth metatarsal head.  Patient has a palpable dorsalis pedis pulse.  Review of the MRI scan shows abscess and osteomyelitis involving the fifth metatarsal head possible involvement of the fourth metatarsal.  Assessment: Assessment: Diabetic insensate neuropathy with abscess osteomyelitis necrotic tissue fifth metatarsal head right foot.  Plan: Plan: Discussed with the patient the minimum surgery would be to proceed with 1/5 ray amputation right foot.  Discussed that we may have to proceed with a fourth and fifth ray amputation depending on the soft tissue involvement with the infection.  Risks and benefits were discussed including persistent infection neurovascular injury nonhealing of the wound need for additional surgery.  Patient states he understands wished to proceed at this time discussed the importance of n.p.o. prior to surgery and we will plan for surgery approximately 5 PM today.  Thank you for the consult and the opportunity to see [name], [place] [phone] 5:13 PM

## 2018-12-14 NOTE — Progress Notes (Addendum)
Pharmacy Antibiotic Note  [name] is a 68 y.o. male admitted on 12/14/2018 with osteomyelitis.  Pharmacy has been consulted for Vancomycin dosing.  Patient with CKD (baseline Serum Creat 2.5), today with some AKI Scr 5.14.  Addendum: Pharmacy was also asked to dose Cefepime in this patient.   Plan: Vancomycin 2 grams IV x 1 Vanc variable dosing per pharmacy due to changing renal function (Pharmacy will monitor and dose based on levels) Cefepime 1 gram IV q24hr Monitor renal function, clinical status, C&S, and vanc levels as needed  Height: 5\' 10"  (177.8 cm) Weight: 220 lb (99.8 kg) IBW/kg (Calculated) : 73  Temp (24hrs), Avg:100.1 F (37.8 C), Min:100.1 F (37.8 C), Max:100.1 F (37.8 C)  Recent Labs  Lab 12/14/18 1207  WBC 13.0*  CREATININE 5.14*  LATICACIDVEN 1.7    Estimated Creatinine Clearance: 16.1 mL/min (A) (by C-G formula based on SCr of 5.14 mg/dL (H)).    Allergies  Allergen Reactions  . Mushroom Extract Complex Nausea Only    Antimicrobials this admission: Vanc 9/7 >>  Thank you for allowing pharmacy to be a part of this patient's care.  Alanda Slim, PharmD, [facility] Clinical Pharmacist Please see AMION for all Pharmacists' Contact Phone Numbers 12/14/2018, 1:51 PM

## 2018-12-14 NOTE — Progress Notes (Addendum)
Patient trasfered from ED to [phone] via stretcher; alert and oriented x 4; complaints of pain in right foot; IV saline locked in RFA running fluids at 125 ml/hr; skin dry; Left lateral foot - diabetic foot ulcer; right lateral 5th metatarsal - large ulcer; right lateral foot - non-pressure chronic ulcer; MASD buttocks. Orient patient to room and unit; gave patient care guide; instructed how to use the call bell and  fall risk precautions; patient's wife at bedside. Will continue to monitor the patient.

## 2018-12-14 NOTE — ED Triage Notes (Signed)
Pt here for evaluation of recurrent cellulitis on R shin x 5-6 days. Endorses fever of up to 102 at home. Pt had some leftover doxycycline that he took this week.

## 2018-12-15 ENCOUNTER — Encounter (HOSPITAL_COMMUNITY): Payer: Self-pay

## 2018-12-15 ENCOUNTER — Inpatient Hospital Stay (HOSPITAL_COMMUNITY): Payer: Medicare Other | Admitting: Anesthesiology

## 2018-12-15 ENCOUNTER — Encounter (HOSPITAL_COMMUNITY): Payer: Medicare Other

## 2018-12-15 ENCOUNTER — Encounter (HOSPITAL_COMMUNITY)
Admission: EM | Disposition: A | Discharge status: Home Health | Payer: Self-pay | Source: Home / Self Care | Attending: Internal Medicine

## 2018-12-15 DIAGNOSIS — L02611 Cutaneous abscess of right foot: Secondary | ICD-10-CM

## 2018-12-15 DIAGNOSIS — N184 Chronic kidney disease, stage 4 (severe): Secondary | ICD-10-CM

## 2018-12-15 DIAGNOSIS — L97414 Non-pressure chronic ulcer of right heel and midfoot with necrosis of bone: Secondary | ICD-10-CM

## 2018-12-15 DIAGNOSIS — M869 Osteomyelitis, unspecified: Secondary | ICD-10-CM

## 2018-12-15 DIAGNOSIS — M86271 Subacute osteomyelitis, right ankle and foot: Secondary | ICD-10-CM

## 2018-12-15 DIAGNOSIS — Z794 Long term (current) use of insulin: Secondary | ICD-10-CM

## 2018-12-15 DIAGNOSIS — E11621 Type 2 diabetes mellitus with foot ulcer: Secondary | ICD-10-CM

## 2018-12-15 DIAGNOSIS — L97509 Non-pressure chronic ulcer of other part of unspecified foot with unspecified severity: Secondary | ICD-10-CM

## 2018-12-15 HISTORY — PX: AMPUTATION: SHX166

## 2018-12-15 HISTORY — PX: APPLICATION OF WOUND VAC: SHX5189

## 2018-12-15 HISTORY — PX: I & D EXTREMITY: SHX5045

## 2018-12-15 LAB — CBC
HCT: 26.6 % — ABNORMAL LOW (ref 39.0–52.0)
Hemoglobin: 8.5 g/dL — ABNORMAL LOW (ref 13.0–17.0)
MCH: 28.5 pg (ref 26.0–34.0)
MCHC: 32 g/dL (ref 30.0–36.0)
MCV: 89.3 fL (ref 80.0–100.0)
Platelets: 154 10*3/uL (ref 150–400)
RBC: 2.98 MIL/uL — ABNORMAL LOW (ref 4.22–5.81)
RDW: 15.3 % (ref 11.5–15.5)
WBC: 10 10*3/uL (ref 4.0–10.5)
nRBC: 0 % (ref 0.0–0.2)

## 2018-12-15 LAB — TYPE AND SCREEN
ABO/RH(D): O POS
Antibody Screen: NEGATIVE

## 2018-12-15 LAB — PREPARE RBC (CROSSMATCH)

## 2018-12-15 LAB — GLUCOSE, CAPILLARY
Glucose-Capillary: 220 mg/dL — ABNORMAL HIGH (ref 70–99)
Glucose-Capillary: 171 mg/dL — ABNORMAL HIGH (ref 70–99)
Glucose-Capillary: 116 mg/dL — ABNORMAL HIGH (ref 70–99)
Glucose-Capillary: 115 mg/dL — ABNORMAL HIGH (ref 70–99)

## 2018-12-15 LAB — HIV ANTIBODY (ROUTINE TESTING W REFLEX): HIV Screen 4th Generation wRfx: NONREACTIVE

## 2018-12-15 LAB — ABO/RH: ABO/RH(D): O POS

## 2018-12-15 LAB — PROTIME-INR
INR: 1.4 — ABNORMAL HIGH (ref 0.8–1.2)
Prothrombin Time: 16.9 seconds — ABNORMAL HIGH (ref 11.4–15.2)

## 2018-12-15 SURGERY — IRRIGATION AND DEBRIDEMENT EXTREMITY
Anesthesia: General | Site: Foot | Laterality: Right

## 2018-12-15 MED ORDER — METOCLOPRAMIDE HCL 10 MG PO TABS: 5.0000 mg | ORAL_TABLET | Freq: Three times a day (TID) | ORAL | Status: DC | PRN

## 2018-12-15 MED ORDER — BISACODYL 10 MG RE SUPP: 10.0000 mg | Freq: Every day | RECTAL | Status: DC | PRN

## 2018-12-15 MED ORDER — FENTANYL CITRATE (PF) 100 MCG/2ML IJ SOLN
INTRAMUSCULAR | Status: DC | PRN
  Administered 2018-12-15: 50 ug via INTRAVENOUS
  Administered 2018-12-15: 25 ug via INTRAVENOUS
  Administered 2018-12-15: 50 ug via INTRAVENOUS

## 2018-12-15 MED ORDER — SODIUM CHLORIDE 0.9 % IV SOLN
INTRAVENOUS | Status: DC
  Administered 2018-12-15: 16:00:00 via INTRAVENOUS

## 2018-12-15 MED ORDER — PRO-STAT SUGAR FREE PO LIQD
30.0000 mL | Freq: Three times a day (TID) | ORAL | Status: DC
  Administered 2018-12-16 – 2018-12-18 (×7): 30 mL via ORAL
  Filled 2018-12-15 (×7): qty 30

## 2018-12-15 MED ORDER — LIDOCAINE 2% (20 MG/ML) 5 ML SYRINGE
INTRAMUSCULAR | Status: AC
  Filled 2018-12-15: qty 5

## 2018-12-15 MED ORDER — SODIUM CHLORIDE 0.9 % IV SOLN
INTRAVENOUS | Status: DC
  Administered 2018-12-15: 19:00:00 via INTRAVENOUS

## 2018-12-15 MED ORDER — OXYCODONE HCL 5 MG PO TABS: 5.0000 mg | ORAL_TABLET | Freq: Once | ORAL | Status: DC | PRN

## 2018-12-15 MED ORDER — PROPOFOL 10 MG/ML IV BOLUS
INTRAVENOUS | Status: AC
  Filled 2018-12-15: qty 20

## 2018-12-15 MED ORDER — ENSURE PRE-SURGERY PO LIQD: 296.0000 mL | Freq: Once | ORAL | Status: DC

## 2018-12-15 MED ORDER — LIDOCAINE 2% (20 MG/ML) 5 ML SYRINGE
INTRAMUSCULAR | Status: DC | PRN
  Administered 2018-12-15: 60 mg via INTRAVENOUS

## 2018-12-15 MED ORDER — PROMETHAZINE HCL 25 MG/ML IJ SOLN: 6.2500 mg | INTRAMUSCULAR | Status: DC | PRN

## 2018-12-15 MED ORDER — METHOCARBAMOL 1000 MG/10ML IJ SOLN
500.0000 mg | Freq: Four times a day (QID) | INTRAVENOUS | Status: DC | PRN
  Filled 2018-12-15: qty 5

## 2018-12-15 MED ORDER — HYDROMORPHONE HCL 1 MG/ML IJ SOLN: 0.5000 mg | INTRAMUSCULAR | Status: DC | PRN

## 2018-12-15 MED ORDER — POLYETHYLENE GLYCOL 3350 17 G PO PACK: 17.0000 g | PACK | Freq: Every day | ORAL | Status: DC | PRN

## 2018-12-15 MED ORDER — CEFAZOLIN SODIUM-DEXTROSE 2-4 GM/100ML-% IV SOLN
2.0000 g | INTRAVENOUS | Status: AC
  Administered 2018-12-15: 2 g via INTRAVENOUS

## 2018-12-15 MED ORDER — METOCLOPRAMIDE HCL 5 MG/ML IJ SOLN: 5.0000 mg | Freq: Three times a day (TID) | INTRAMUSCULAR | Status: DC | PRN

## 2018-12-15 MED ORDER — HYDROMORPHONE HCL 1 MG/ML IJ SOLN: 0.2500 mg | INTRAMUSCULAR | Status: DC | PRN

## 2018-12-15 MED ORDER — ONDANSETRON HCL 4 MG PO TABS: 4.0000 mg | ORAL_TABLET | Freq: Four times a day (QID) | ORAL | Status: DC | PRN

## 2018-12-15 MED ORDER — PHENYLEPHRINE 40 MCG/ML (10ML) SYRINGE FOR IV PUSH (FOR BLOOD PRESSURE SUPPORT)
PREFILLED_SYRINGE | INTRAVENOUS | Status: DC | PRN
  Administered 2018-12-15: 120 ug via INTRAVENOUS
  Administered 2018-12-15: 160 ug via INTRAVENOUS
  Administered 2018-12-15 (×3): 80 ug via INTRAVENOUS

## 2018-12-15 MED ORDER — ONDANSETRON HCL 4 MG/2ML IJ SOLN
INTRAMUSCULAR | Status: AC
  Filled 2018-12-15: qty 2

## 2018-12-15 MED ORDER — CEFAZOLIN SODIUM-DEXTROSE 2-4 GM/100ML-% IV SOLN
INTRAVENOUS | Status: AC
  Filled 2018-12-15: qty 100

## 2018-12-15 MED ORDER — POVIDONE-IODINE 10 % EX SWAB: 2.0000 "application " | Freq: Once | CUTANEOUS | Status: DC

## 2018-12-15 MED ORDER — ONDANSETRON HCL 4 MG/2ML IJ SOLN
INTRAMUSCULAR | Status: DC | PRN
  Administered 2018-12-15: 4 mg via INTRAVENOUS

## 2018-12-15 MED ORDER — FENTANYL CITRATE (PF) 250 MCG/5ML IJ SOLN
INTRAMUSCULAR | Status: AC
  Filled 2018-12-15: qty 5

## 2018-12-15 MED ORDER — METHOCARBAMOL 500 MG PO TABS: 500.0000 mg | ORAL_TABLET | Freq: Four times a day (QID) | ORAL | Status: DC | PRN

## 2018-12-15 MED ORDER — DOCUSATE SODIUM 100 MG PO CAPS
100.0000 mg | ORAL_CAPSULE | Freq: Two times a day (BID) | ORAL | Status: DC
  Administered 2018-12-16 – 2018-12-17 (×4): 100 mg via ORAL
  Filled 2018-12-15 (×6): qty 1

## 2018-12-15 MED ORDER — MAGNESIUM CITRATE PO SOLN: 1.0000 | Freq: Once | ORAL | Status: DC | PRN

## 2018-12-15 MED ORDER — OXYCODONE HCL 5 MG PO TABS: 10.0000 mg | ORAL_TABLET | ORAL | Status: DC | PRN

## 2018-12-15 MED ORDER — 0.9 % SODIUM CHLORIDE (POUR BTL) OPTIME
TOPICAL | Status: DC | PRN
  Administered 2018-12-15: 1000 mL

## 2018-12-15 MED ORDER — ONDANSETRON HCL 4 MG/2ML IJ SOLN: 4.0000 mg | Freq: Four times a day (QID) | INTRAMUSCULAR | Status: DC | PRN

## 2018-12-15 MED ORDER — CHLORHEXIDINE GLUCONATE 4 % EX LIQD: 60.0000 mL | Freq: Once | CUTANEOUS | Status: DC

## 2018-12-15 MED ORDER — PHENYLEPHRINE 40 MCG/ML (10ML) SYRINGE FOR IV PUSH (FOR BLOOD PRESSURE SUPPORT)
PREFILLED_SYRINGE | INTRAVENOUS | Status: AC
  Filled 2018-12-15: qty 10

## 2018-12-15 MED ORDER — RENA-VITE PO TABS
1.0000 | ORAL_TABLET | Freq: Every day | ORAL | Status: DC
  Administered 2018-12-16 – 2018-12-17 (×3): 1 via ORAL
  Filled 2018-12-15 (×3): qty 1

## 2018-12-15 MED ORDER — SODIUM CHLORIDE 0.9% IV SOLUTION: Freq: Once | INTRAVENOUS | Status: DC

## 2018-12-15 MED ORDER — PROPOFOL 10 MG/ML IV BOLUS
INTRAVENOUS | Status: DC | PRN
  Administered 2018-12-15: 120 mg via INTRAVENOUS

## 2018-12-15 MED ORDER — OXYCODONE HCL 5 MG PO TABS: 5.0000 mg | ORAL_TABLET | ORAL | Status: DC | PRN

## 2018-12-15 MED ORDER — OXYCODONE HCL 5 MG/5ML PO SOLN: 5.0000 mg | Freq: Once | ORAL | Status: DC | PRN

## 2018-12-15 MED ORDER — ACETAMINOPHEN 500 MG PO TABS: 1000.0000 mg | ORAL_TABLET | Freq: Once | ORAL | Status: DC

## 2018-12-15 MED ORDER — ACETAMINOPHEN 325 MG PO TABS: 325.0000 mg | ORAL_TABLET | Freq: Four times a day (QID) | ORAL | Status: DC | PRN

## 2018-12-15 SURGICAL SUPPLY — 42 items
BENZOIN TINCTURE PRP APPL 2/3 (GAUZE/BANDAGES/DRESSINGS) ×3 IMPLANT
BLADE SAW SGTL HD 18.5X60.5X1. (BLADE) ×3 IMPLANT
BLADE SURG 21 STRL SS (BLADE) ×3 IMPLANT
BNDG COHESIVE 4X5 TAN STRL (GAUZE/BANDAGES/DRESSINGS) IMPLANT
BNDG COHESIVE 6X5 TAN STRL LF (GAUZE/BANDAGES/DRESSINGS) IMPLANT
BNDG GAUZE ELAST 4 BULKY (GAUZE/BANDAGES/DRESSINGS) ×6 IMPLANT
CONT SPEC 4OZ CLIKSEAL STRL BL (MISCELLANEOUS) ×1 IMPLANT
COVER SURGICAL LIGHT HANDLE (MISCELLANEOUS) ×6 IMPLANT
COVER WAND RF STERILE (DRAPES) ×3 IMPLANT
DRAPE INCISE IOBAN 66X45 STRL (DRAPES) ×3 IMPLANT
DRAPE U-SHAPE 47X51 STRL (DRAPES) ×3 IMPLANT
DRSG ADAPTIC 3X8 NADH LF (GAUZE/BANDAGES/DRESSINGS) ×3 IMPLANT
DRSG PAD ABDOMINAL 8X10 ST (GAUZE/BANDAGES/DRESSINGS) IMPLANT
DURAPREP 26ML APPLICATOR (WOUND CARE) ×3 IMPLANT
ELECT REM PT RETURN 9FT ADLT (ELECTROSURGICAL) ×3
ELECTRODE REM PT RTRN 9FT ADLT (ELECTROSURGICAL) ×2 IMPLANT
GAUZE SPONGE 4X4 12PLY STRL (GAUZE/BANDAGES/DRESSINGS) ×3 IMPLANT
GLOVE BIOGEL PI IND STRL 9 (GLOVE) ×2 IMPLANT
GLOVE BIOGEL PI INDICATOR 9 (GLOVE) ×1
GLOVE SURG ORTHO 9.0 STRL STRW (GLOVE) ×3 IMPLANT
GOWN STRL REUS W/ TWL XL LVL3 (GOWN DISPOSABLE) ×6 IMPLANT
GOWN STRL REUS W/TWL XL LVL3 (GOWN DISPOSABLE) ×3
HANDPIECE INTERPULSE COAX TIP (DISPOSABLE)
KIT BASIN OR (CUSTOM PROCEDURE TRAY) ×3 IMPLANT
KIT PREVENA INCISION MGT 13 (CANNISTER) ×1 IMPLANT
KIT PREVENA INCISION MGT20CM45 (CANNISTER) ×1 IMPLANT
KIT TURNOVER KIT B (KITS) ×3 IMPLANT
MANIFOLD NEPTUNE II (INSTRUMENTS) ×3 IMPLANT
NS IRRIG 1000ML POUR BTL (IV SOLUTION) ×3 IMPLANT
PACK ORTHO EXTREMITY (CUSTOM PROCEDURE TRAY) ×3 IMPLANT
PAD ARMBOARD 7.5X6 YLW CONV (MISCELLANEOUS) ×6 IMPLANT
SET HNDPC FAN SPRY TIP SCT (DISPOSABLE) IMPLANT
SPONGE LAP 18X18 RF (DISPOSABLE) IMPLANT
STOCKINETTE IMPERVIOUS 9X36 MD (GAUZE/BANDAGES/DRESSINGS) IMPLANT
SUT ETHILON 2 0 PSLX (SUTURE) ×6 IMPLANT
SWAB COLLECTION DEVICE MRSA (MISCELLANEOUS) ×3 IMPLANT
SWAB CULTURE ESWAB REG 1ML (MISCELLANEOUS) IMPLANT
TOWEL GREEN STERILE (TOWEL DISPOSABLE) ×3 IMPLANT
TOWEL GREEN STERILE FF (TOWEL DISPOSABLE) ×3 IMPLANT
TUBE CONNECTING 12X1/4 (SUCTIONS) ×3 IMPLANT
WATER STERILE IRR 1000ML POUR (IV SOLUTION) ×3 IMPLANT
YANKAUER SUCT BULB TIP NO VENT (SUCTIONS) ×3 IMPLANT

## 2018-12-15 NOTE — Anesthesia Postprocedure Evaluation (Signed)
Anesthesia Post Note  Patient: [name]  Procedure(s) Performed: DEBRIDEMENT RIGHT FOOT (Right ) AMPUTATION OF 4TH AND 5TH TOES RIGHT FOOT (Right Foot) APPLICATION OF WOUND VAC (Right Foot)     Patient location during evaluation: PACU Anesthesia Type: General Level of consciousness: awake and alert Pain management: pain level controlled Vital Signs Assessment: post-procedure vital signs reviewed and stable Respiratory status: spontaneous breathing, nonlabored ventilation, respiratory function stable and patient connected to nasal cannula oxygen Cardiovascular status: blood pressure returned to baseline and stable Postop Assessment: no apparent nausea or vomiting Anesthetic complications: no    Last Vitals:  Vitals:   12/15/18 1734 12/15/18 1749  BP: (!) 109/57 108/61  Pulse: 90 91  Resp: 20 18  Temp:    SpO2: 98% 99%    Last Pain:  Vitals:   12/15/18 1719  TempSrc:   PainSc: Asleep                 Ellene Bloodsaw,W. EDMOND

## 2018-12-15 NOTE — Telephone Encounter (Signed)
Do you want to refill for the pt?

## 2018-12-15 NOTE — Anesthesia Preprocedure Evaluation (Addendum)
Anesthesia Evaluation  Patient identified by MRN, date of birth, ID band Patient awake    Reviewed: Allergy & Precautions, H&P , NPO status , Patient's Chart, lab work & pertinent test results, reviewed documented beta blocker date and time   Airway Mallampati: II  TM Distance: >3 FB Neck ROM: full    Dental  (+) Caps, Dental Advisory Given All upper front capped:   Pulmonary neg pulmonary ROS, former smoker,    Pulmonary exam normal breath sounds clear to auscultation       Cardiovascular Exercise Tolerance: Poor hypertension, Pt. on home beta blockers and Pt. on medications + CAD  Normal cardiovascular exam Rhythm:regular Rate:Normal  S/p CABG at OSH in 2005: RIMA to LAD, LIMA to obtuse marginal, SVG to diagonal and SVG to PDA. EKG 05/18/2018: Sinus rhythm with first-degree AV block at the rate of 81 bpm, normal axis, incomplete right bundle branch block.  Nonspecific T abnormality. No significant change from EKG 05/19/2017.   Echo- 06/08/13 1. Left ventricular cavity is normal in size. Mild concentric hypertrophy. Normal global wall motion. Normal systolic global function. Calculated EF 57%. Visual EF is 60-65%. 2. Left atrial cavity is mildly dilated. Mild aneurysmal motion of the interatrial septum. 3. Mild aortic valve leaflet calcification. 4. Mild calcification of the mitral annulus. Mild mitral regurgitation. 5. Tricuspid valve structurally normal. Mild tricuspid regurgitation.  Lexiscan sestamibi stress test 05/28/2013: 1. Resting EKG NSR, Poor R wave progression. Low voltage complexes. Stress EKG was non diagnostic for ischemia. No ST-T changes of ischemia noted with pharmacologic stress testing.  2. The perfusion study demonstrated normal isotope uptake both at rest and stress. There was no evidence of ischemia or scar. Dynamic gated images reveal normal wall motion and endocardial thickening. Left ventricular ejection  fraction was estimated to be 67%.   Neuro/Psych Diabetic neuropathy Cervical myelopathy negative neurological ROS  negative psych ROS   GI/Hepatic negative GI ROS, Neg liver ROS,   Endo/Other  diabetes, Type 2, Oral Hypoglycemic AgentsHypothyroidism   Renal/GU CRFRenal diseaseCKD 4   Hx of prostate cancer    Musculoskeletal  (+) Arthritis , Osteoarthritis,  RLE midfoot ulcer, cellulitis, osteomyelitis   Abdominal   Peds  Hematology negative hematology ROS (+)   Anesthesia Other Findings   Reproductive/Obstetrics negative OB ROS                            Anesthesia Physical  Anesthesia Plan  ASA: III  Anesthesia Plan: General   Post-op Pain Management:    Induction: Intravenous  PONV Risk Score and Plan: 2 and Ondansetron and Dexamethasone  Airway Management Planned: LMA  Additional Equipment: None  Intra-op Plan:   Post-operative Plan:   Informed Consent: I have reviewed the patients History and Physical, chart, labs and discussed the procedure including the risks, benefits and alternatives for the proposed anesthesia with the patient or authorized representative who has indicated his/her understanding and acceptance.     Dental Advisory Given  Plan Discussed with: CRNA and Surgeon  Anesthesia Plan Comments:         Anesthesia Quick Evaluation

## 2018-12-15 NOTE — Plan of Care (Signed)
  Problem: Clinical Measurements: Goal: Respiratory complications will improve Outcome: Progressing   Problem: Activity: Goal: Risk for activity intolerance will decrease Outcome: Progressing   Problem: Pain Managment: Goal: General experience of comfort will improve Outcome: Progressing   Problem: Skin Integrity: Goal: Risk for impaired skin integrity will decrease Outcome: Progressing   

## 2018-12-15 NOTE — Anesthesia Procedure Notes (Signed)
Procedure Name: LMA Insertion Date/Time: 12/15/2018 4:39 PM Performed by: Kynlea Blackston T, CRNA Pre-anesthesia Checklist: Patient identified, Emergency Drugs available, Suction available and Patient being monitored Patient Re-evaluated:Patient Re-evaluated prior to induction Oxygen Delivery Method: Circle system utilized Preoxygenation: Pre-oxygenation with 100% oxygen Induction Type: IV induction LMA Size: 5.0 Number of attempts: 1 Airway Equipment and Method: Patient positioned with wedge pillow Placement Confirmation: positive ETCO2 and breath sounds checked- equal and bilateral Tube secured with: Tape Dental Injury: Teeth and Oropharynx as per pre-operative assessment

## 2018-12-15 NOTE — Consult Note (Signed)
[place] Nurse wound consult note Patient receiving care in [facility] [phone]. Reason for Consult: LE wound Per the note from Dr. Kathrynn Speed 12/14/18 at 1347:  "I have reviewed the patient's chart and seen pictures of his foot.  He will obviously need some type of surgery on that foot.  I will inform Dr. [name] about the patient.  He will need to be n.p.o. after midnight tonight for surgery tomorrow (9-/8)."  Therefore, the WOC is signing off on this consult. Val Riles, RN, MSN, CWOCN, CNS-BC, pager [phone]

## 2018-12-15 NOTE — Progress Notes (Signed)
Initial Nutrition Assessment  DOCUMENTATION CODES:   Obesity unspecified  INTERVENTION:   -Renal MVI daily -30 ml Prostat TID, each supplement provides 100 kcals and 15 grams protein  NUTRITION DIAGNOSIS:   Increased nutrient needs related to post-op healing as evidenced by estimated needs.  GOAL:   Patient will meet greater than or equal to 90% of their needs  MONITOR:   PO intake, Supplement acceptance, Diet advancement, Labs, Weight trends, Skin, I & O's  REASON FOR ASSESSMENT:   Consult Wound healing  ASSESSMENT:   [name] is a 68 y.o. male with medical history significant of hypertension, diabetes, chronic right foot wound under the care of Dr. [name], chronic kidney disease stage IV under the care of Dr. Hollie Salk.  Patient reports that for the past 5 to 6 days and his wife have noticed developing erythema around his chronic right foot wound.  This is been draining dark/purulent material.  He is noticed fevers for the past 3 days.  He says wound is mildly tender.  Reports urine output has been normal for him.  He has not had any vomiting or diarrhea.  Does report cough and shortness of breath for the last 3 days, although he is not had any since he is come to the hospital.  No chest pain.  Pt admitted with osteomyelitis of rt foot.   Reviewed I/O's: +2 L x 24 hours   UOP: 250 ml x 24 hours   Pt currently NPO for surgery (per orthopedics, fourth and fifth ray amputation depending on the soft tissue involvement with the infection). Pt previously on a renal, carb modified diet with 1.2 L fluid restriction and was consuming 100% of meals.  Pt sleeping soundly at time of visit and did not respond to voice.   Reviewed wt history; pt has experienced a 2.3% wt loss over the past 3 months.  Pt with increased nutritional needs for healing and wound benefit from addition of nutritional supplements.   Lab Results  Component Value Date   HGBA1C 6.3 (H) 12/14/2018   PTA DM  medications are 24 units insulin degludec q HS.   Labs reviewed: CBGS: 220 (inpatient orders for glycemic control are 0-5 units insulin aspart q HS, 0-9 units insulin aspart TID with meals, and 24 units insulin glargine q HS).   NUTRITION - FOCUSED PHYSICAL EXAM:    Most Recent Value  Orbital Region  No depletion  Upper Arm Region  No depletion  Thoracic and Lumbar Region  No depletion  Buccal Region  No depletion  Temple Region  No depletion  Clavicle Bone Region  No depletion  Clavicle and Acromion Bone Region  No depletion  Scapular Bone Region  No depletion  Dorsal Hand  No depletion  Patellar Region  No depletion  Anterior Thigh Region  No depletion  Posterior Calf Region  No depletion  Edema (RD Assessment)  Mild  Hair  Reviewed  Eyes  Reviewed  Mouth  Reviewed  Skin  Reviewed  Nails  Reviewed       Diet Order:   Diet Order            Diet NPO time specified  Diet effective midnight              EDUCATION NEEDS:   No education needs have been identified at this time  Skin:  Skin Assessment: Skin Integrity Issues: Skin Integrity Issues:: Other (Comment), Diabetic Ulcer Diabetic Ulcer: lt lateral foot Other: osteomyelitis ulceration abscess right  foot fifth metatarsal head, chronic non-pressure ulcer rt foot, MASD sacrum  Last BM:  12/12/18  Height:   Ht Readings from Last 1 Encounters:  12/15/18 5\' 10"  (1.778 m)    Weight:   Wt Readings from Last 1 Encounters:  12/15/18 99.8 kg    Ideal Body Weight:  75.5 kg  BMI:  Body mass index is 31.57 kg/m.  Estimated Nutritional Needs:   Kcal:  2200-2400  Protein:  110-125 grams  Fluid:  2.2-2.4 L    Mikahla Wisor A. Jimmye [name], RD, LDN, [place] Registered Dietitian II Certified Diabetes Care and Education Specialist Pager: [phone] After hours Pager: [phone]

## 2018-12-15 NOTE — Op Note (Signed)
12/15/2018  5:21 PM  PATIENT:  Francis Dowse    PRE-OPERATIVE DIAGNOSIS:  Infected Right foot  POST-OPERATIVE DIAGNOSIS:  Same  PROCEDURE:  DEBRIDEMENT RIGHT FOOT, AMPUTATION OF 4TH AND 5TH TOES RIGHT FOOT,  APPLICATION OF WOUND VAC Local tissue rearrangement for wound closure 11 x 5 cm.  SURGEON:  [name], MD  PHYSICIAN ASSISTANT:None ANESTHESIA:   General  PREOPERATIVE INDICATIONS:  [name] Goeken is a  68 y.o. male with a diagnosis of Infected Right foot who failed conservative measures and elected for surgical management.    The risks benefits and alternatives were discussed with the patient preoperatively including but not limited to the risks of infection, bleeding, nerve injury, cardiopulmonary complications, the need for revision surgery, among others, and the patient was willing to proceed.  OPERATIVE IMPLANTS: Praveena wound VAC 13 cm sponge  @ENCIMAGES @  OPERATIVE FINDINGS: Large purulent abscess that circumferentially involves the skin and soft tissue and bone of the fourth and fifth metatarsals.  Soft tissue was sent for cultures.  OPERATIVE PROCEDURE: Patient was brought the operating room and underwent a general anesthetic.  After adequate levels anesthesia were obtained patient's right lower extremity was prepped using DuraPrep draped into a sterile field a timeout was called.  A racquet incision was first made around the ulcerative tissue and the fifth metatarsal.  This showed that the margins of surgery were still completely infected and necrotic soft tissue.  The margins were then extended to encompass the fourth metatarsal.  The fourth and fifth metatarsals were resected through the base and the tissue was sent for cultures.  Patient had further infected tissue dorsally and this dorsal soft tissue infected tendons was resected.  Patient also had further infection plantarly and this infected plantar soft tissue was also resected.  The wound was irrigated with normal  saline and the wound edges were healthy there was a small area of 1 cm dorsally on the foot where she had some ischemic changes but the tissue was viable.  Electrocautery was used for hemostasis.  Local tissue rearrangement was used to close the wound 11 x 5 cm.  The Praveena wound VAC was applied this had a good suction fit patient was extubated taken the PACU in stable condition.   DISCHARGE PLANNING:  Antibiotic duration: Continue antibiotics for at least 4 weeks postoperatively.  Oral or IV antibiotics to be determined by tissue cultures.  Weightbearing: Strict nonweightbearing on the right  Pain medication: Opioid pathway ordered  Dressing care/ Wound VAC: Continue wound VAC at discharge  Ambulatory devices: Walker or crutches or kneeling scooter  Discharge to: Anticipate discharge to home  Follow-up: In the office 1 week post operative.

## 2018-12-15 NOTE — Progress Notes (Signed)
Blood transfusion of 1 unit prbc on hold per MD due to shortage of patient's blood type and current hemoglobin level.

## 2018-12-15 NOTE — Transfer of Care (Signed)
Immediate Anesthesia Transfer of Care Note  Patient: [name]  Procedure(s) Performed: DEBRIDEMENT RIGHT FOOT (Right ) AMPUTATION OF 4TH AND 5TH TOES RIGHT FOOT (Right Foot) APPLICATION OF WOUND VAC (Right Foot)  Patient Location: PACU  Anesthesia Type:General  Level of Consciousness: drowsy  Airway & Oxygen Therapy: Patient Spontanous Breathing and Patient connected to nasal cannula oxygen  Post-op Assessment: Report given to RN, Post -op Vital signs reviewed and stable and Patient moving all extremities  Post vital signs: Reviewed and stable  Last Vitals:  Vitals Value Taken Time  BP 99/53 12/15/18 1719  Temp    Pulse 89 12/15/18 1723  Resp 18 12/15/18 1723  SpO2 98 % 12/15/18 1723  Vitals shown include unvalidated device data.  Last Pain:  Vitals:   12/15/18 1719  TempSrc:   PainSc: (P) Asleep      Patients Stated Pain Goal: 4 (29/09/03 0149)  Complications: No apparent anesthesia complications

## 2018-12-15 NOTE — Progress Notes (Signed)
PROGRESS NOTE    [name]  LDJ:[id number] DOB: 09-26-1950 DOA: 12/14/2018 PCP: Fanny Bien, MD    Brief Narrative:  68 year old diabetic, hypertension and chronic right foot wound, chronic kidney disease stage IV presented to the emergency room with 5 to 6 days of worsening erythema around his chronic right foot wound.  Draining purulent material.  Noticed fever for 3 days.  Patient found to have worsening diabetic foot infection with osteomyelitis and admitted to the hospital.   Assessment & Plan:   Active Problems:   Diabetic foot ulcer (HCC)   HTN (hypertension)   Hypothyroidism   Diabetes mellitus ([place])   Osteomyelitis ([place])   AKI (acute kidney injury) ([place])   CKD (chronic kidney disease) stage 4, GFR 15-29 ml/min (HCC)  Diabetic foot ulcer with osteomyelitis: Continue broad-spectrum antibiotics.  Surgical cultures will be appreciated.  Blood cultures negative so far.  Currently remains hemodynamically stable.  Patient will need surgical debridement/amputation that is planned for tonight.  Adequate pain medications.  Acute kidney injury on chronic kidney stage IV: Patient has advanced kidney disease.  Adequate urine output.  Medications to dose with renal functions.  Continue very close monitoring.  Will discuss with nephrology as needed.  Type 2 diabetes: On insulin.  Currently well controlled.  Latest A1c 6.3.  Continue similar doses of insulin.  Hypertension: Blood pressures are adequate.  Continue home medications.  Hypothyroidism: On Synthroid.  Continue.   DVT prophylaxis: Heparin subcu Code Status: Full code Family Communication: None Disposition Plan: Continue inpatient hospitalization.  Per surgery today.   Consultants:   Orthopedics  Procedures:   None  Antimicrobials:  Cefepime, 12/14/2018>>> Vancomycin, 12/14/2018>>> Flagyl, 12/14/2018>>>   Subjective: Patient seen and examined.  No overnight events.  Temperature maximum 100.7.  Purulent draining  right leg and some pain on movement.  N.p.o. for surgical procedure.  Objective: Vitals:   12/14/18 2215 12/15/18 0037 12/15/18 0254 12/15/18 0445  BP:  113/65 (!) 118/58 109/67  Pulse:  89 86 90  Resp:  16 16 18   Temp: (!) 100.7 F (38.2 C) 98.4 F (36.9 C) 98.1 F (36.7 C) (!) 97.5 F (36.4 C)  TempSrc:      SpO2:  97% 97% 97%  Weight:      Height:        Intake/Output Summary (Last 24 hours) at 12/15/2018 1255 Last data filed at 12/15/2018 0600 Gross per 24 hour  Intake 2277.79 ml  Output 250 ml  Net 2027.79 ml   Filed Weights   12/14/18 1154  Weight: 99.8 kg    Examination:  General exam: Appears calm and comfortable, on room air. Respiratory system: Clear to auscultation. Respiratory effort normal.  No added sounds. Cardiovascular system: S1 & S2 heard, RRR. No JVD, murmurs, rubs, gallops or clicks. No pedal edema. Gastrointestinal system: Abdomen is nondistended, soft and nontender. No organomegaly or masses felt. Normal bowel sounds heard. Central nervous system: Alert and oriented. No focal neurological deficits. Extremities: Symmetric 5 x 5 power. Psychiatry: Judgement and insight appear normal. Mood & affect appropriate.  Right lateral foot: Right erythematous, fluctuant swelling and purulent drainage from the lateral aspect of metatarsal head area.   Data Reviewed: I have personally reviewed following labs and imaging studies  CBC: Recent Labs  Lab 12/14/18 1207 12/14/18 1739 12/15/18 0241  WBC 13.0* 11.7* 10.0  NEUTROABS 11.4*  --   --   HGB 10.4* 8.7* 8.5*  HCT 33.1* 26.2* 26.6*  MCV 92.7 88.2 89.3  PLT 197 175 144   Basic Metabolic Panel: Recent Labs  Lab 12/14/18 1207 12/14/18 1739  NA 134*  --   K 4.5  --   CL 103  --   CO2 16*  --   GLUCOSE 237*  --   BUN 58*  --   CREATININE 5.14* 5.18*  CALCIUM 8.5*  --    GFR: Estimated Creatinine Clearance: 15.9 mL/min (A) (by C-G formula based on SCr of 5.18 mg/dL (H)). Liver Function  Tests: Recent Labs  Lab 12/14/18 1207  AST 27  ALT 25  ALKPHOS 46  BILITOT 0.6  PROT 6.7  ALBUMIN 3.0*   No results for input(s): LIPASE, AMYLASE in the last 168 hours. No results for input(s): AMMONIA in the last 168 hours. Coagulation Profile: Recent Labs  Lab 12/15/18 0241  INR 1.4*   Cardiac Enzymes: No results for input(s): CKTOTAL, CKMB, CKMBINDEX, TROPONINI in the last 168 hours. BNP (last 3 results) No results for input(s): PROBNP in the last 8760 hours. HbA1C: Recent Labs    12/14/18 1739  HGBA1C 6.3*   CBG: Recent Labs  Lab 12/14/18 1641 12/14/18 2159 12/15/18 0816 12/15/18 1221  GLUCAP 213* 199* 220* 171*   Lipid Profile: No results for input(s): CHOL, HDL, LDLCALC, TRIG, CHOLHDL, LDLDIRECT in the last 72 hours. Thyroid Function Tests: No results for input(s): TSH, T4TOTAL, FREET4, T3FREE, THYROIDAB in the last 72 hours. Anemia Panel: No results for input(s): VITAMINB12, FOLATE, FERRITIN, TIBC, IRON, RETICCTPCT in the last 72 hours. Sepsis Labs: Recent Labs  Lab 12/14/18 1207 12/14/18 1645  LATICACIDVEN 1.7 1.1    Recent Results (from the past 240 hour(s))  Wound or Superficial Culture     Status: None (Preliminary result)   Collection Time: 12/14/18 12:46 PM   Specimen: Wound  Result Value Ref Range Status   Specimen Description WOUND  Final   Special Requests Immunocompromised  Final   Gram Stain   Final    RARE WBC PRESENT,BOTH PMN AND MONONUCLEAR FEW GRAM POSITIVE COCCI FEW GRAM POSITIVE RODS MODERATE GRAM NEGATIVE RODS    Culture   Final    CULTURE REINCUBATED FOR BETTER GROWTH Performed at [facility], [place] [address], [place], [place] [address]    Report Status PENDING  Incomplete  SARS Coronavirus 2 [facility] order, Performed in [facility] hospital lab) Nasopharyngeal Nasopharyngeal Swab     Status: None   Collection Time: 12/14/18  1:59 PM   Specimen: Nasopharyngeal Swab  Result Value Ref Range Status   SARS  Coronavirus 2 NEGATIVE NEGATIVE Final    Comment: (NOTE) If result is NEGATIVE SARS-CoV-2 target nucleic acids are NOT DETECTED. The SARS-CoV-2 RNA is generally detectable in upper and lower  respiratory specimens during the acute phase of infection. The lowest  concentration of SARS-CoV-2 viral copies this assay can detect is 250  copies / mL. A negative result does not preclude SARS-CoV-2 infection  and should not be used as the sole basis for treatment or other  patient management decisions.  A negative result may occur with  improper specimen collection / handling, submission of specimen other  than nasopharyngeal swab, presence of viral mutation(s) within the  areas targeted by this assay, and inadequate number of viral copies  (<250 copies / mL). A negative result must be combined with clinical  observations, patient history, and epidemiological information. If result is POSITIVE SARS-CoV-2 target nucleic acids are DETECTED. The SARS-CoV-2 RNA is generally detectable in upper and lower  respiratory specimens dur ing the acute phase of infection.  Positive  results are indicative of active infection with SARS-CoV-2.  Clinical  correlation with patient history and other diagnostic information is  necessary to determine patient infection status.  Positive results do  not rule out bacterial infection or co-infection with other viruses. If result is PRESUMPTIVE POSTIVE SARS-CoV-2 nucleic acids MAY BE PRESENT.   A presumptive positive result was obtained on the submitted specimen  and confirmed on repeat testing.  While 2019 novel coronavirus  (SARS-CoV-2) nucleic acids may be present in the submitted sample  additional confirmatory testing may be necessary for epidemiological  and / or clinical management purposes  to differentiate between  SARS-CoV-2 and other Sarbecovirus currently known to infect humans.  If clinically indicated additional testing with an alternate test   methodology [phone]) is advised. The SARS-CoV-2 RNA is generally  detectable in upper and lower respiratory sp ecimens during the acute  phase of infection. The expected result is Negative. Fact Sheet for Patients:  StrictlyIdeas.no Fact Sheet for Healthcare Providers: BankingDealers.co.za This test is not yet approved or cleared by the [place] FDA and has been authorized for detection and/or diagnosis of SARS-CoV-2 by FDA under an Emergency Use Authorization (EUA).  This EUA will remain in effect (meaning this test can be used) for the duration of the COVID-19 declaration under Section 564(b)(1) of the Act, 21 U.S.C. section 360bbb-3(b)(1), unless the authorization is terminated or revoked sooner. Performed at [facility], [place] [address], [place], [place] [address]   Blood culture (routine x 2)     Status: None (Preliminary result)   Collection Time: 12/14/18  2:35 PM   Specimen: BLOOD  Result Value Ref Range Status   Specimen Description BLOOD LEFT ANTECUBITAL  Final   Special Requests AEROBIC BOTTLE ONLY Blood Culture adequate volume  Final   Culture   Final    NO GROWTH < 24 HOURS Performed at [facility], [place] [address] [place], [place] [address]    Report Status PENDING  Incomplete  Surgical PCR screen     Status: None   Collection Time: 12/14/18  4:34 PM   Specimen: Nasal Mucosa; Nasal Swab  Result Value Ref Range Status   MRSA, PCR NEGATIVE NEGATIVE Final   Staphylococcus aureus NEGATIVE NEGATIVE Final    Comment: (NOTE) The Xpert SA Assay (FDA approved for NASAL specimens in patients 25 years of age and older), is one component of a comprehensive surveillance program. It is not intended to diagnose infection nor to guide or monitor treatment. Performed at [facility], [place] [address] [place], [place] [address]   Blood culture (routine x 2)     Status: None (Preliminary result)    Collection Time: 12/14/18  4:45 PM   Specimen: BLOOD  Result Value Ref Range Status   Specimen Description BLOOD LEFT ANTECUBITAL  Final   Special Requests   Final    BOTTLES DRAWN AEROBIC ONLY Blood Culture adequate volume   Culture   Final    NO GROWTH < 24 HOURS Performed at [facility], [place] [address], [place], [place] [address]    Report Status PENDING  Incomplete         Radiology Studies: Dg Chest 2 View  Result Date: 12/14/2018 CLINICAL DATA:  Fever. EXAM: CHEST - 2 VIEW COMPARISON:  December 24, 2015 FINDINGS: Stable postsurgical changes. Cardiomediastinal silhouette is normal. Mediastinal contours appear intact. There is no evidence  of focal airspace consolidation, pleural effusion or pneumothorax. Osseous structures are without acute abnormality. Soft tissues are grossly normal. IMPRESSION: No active cardiopulmonary disease. Electronically Signed   By: Fidela Salisbury M.D.   On: 12/14/2018 13:11   Mr Foot Right Wo Contrast  Result Date: 12/14/2018 CLINICAL DATA:  O: On the lateral aspect around the right fifth digit. EXAM: MRI OF THE RIGHT FOREFOOT WITHOUT CONTRAST TECHNIQUE: Multiplanar, multisequence MR imaging of the right forefoot was performed. No intravenous contrast was administered. COMPARISON:  None. FINDINGS: Bones/Joint/Cartilage There is increased T2 signal involving the fifth metatarsal head with subtle T1 hypointensity seen along the lateral margin best seen on series 4, image 8. There is also increased T2 signal seen involving the fifth metatarsal head diffusely and the fifth phalanges. There is also mildly increased STIR signal seen at the fourth proximal and distal phalanges. No areas cortical destruction or bony erosion are seen. No cysts seen at T1 hypointensity are noted. Ligaments The Lisfranc ligaments appear to be intact. Muscles and Tendons There is mild fatty atrophy noted within the muscles surrounding the forefoot. There is diffusely increased  signal seen within the musculature surrounding the forefoot however. There is fluid signal surrounding the fifth extensor digitorum tendon at the level of the metatarsal head best seen on series 5, image 8. The remainder of the flexor extensor tendons are intact. Soft tissues Overlying skin ulceration seen along the lateral aspect of the foot measuring 1.3 cm in length with diffuse subcutaneous edema. There is subcutaneous emphysema seen surrounding the dorsal aspect of the fifth digit and extending into the interdigital space. IMPRESSION: 1. Reactive marrow versus early osteomyelitis involving the lateral aspect of the fifth metatarsal head. No destructive changes however. Overlying area of skin ulceration and diffuse edema. No soft tissue abscess. 2. Reactive marrow involving the fifth and fourth digits as described above. 3. Fifth extensor digitorum tenosynovitis 4. Subcutaneous emphysema on the dorsal aspect of the fifth digit and interdigital space. Electronically Signed   By: Prudencio Pair M.D.   On: 12/14/2018 20:02   Dg Foot Complete Right  Result Date: 12/14/2018 CLINICAL DATA:  Infection fifth toe EXAM: RIGHT FOOT COMPLETE - 3+ VIEW COMPARISON:  None. FINDINGS: Soft tissue swelling and extensive soft tissue air the level of the fifth toe and metatarsal head. Loss cortical distinctness of the medial aspect of the fifth metatarsal head is suspicious for acute osteomyelitis. No fracture identified. No dislocation. Mild degenerative changes within the midfoot. Prominent calcaneal enthesophyte. Vascular calcifications. IMPRESSION: 1. Findings suspicious for osteomyelitis of the fifth metatarsal head. 2. Soft tissue swelling with air within the soft tissues at the level of the fifth digit and fifth metatarsal head suggesting ulceration. No soft tissue gas is seen tracking proximally within the forefoot or hindfoot. Electronically Signed   By: Davina Poke M.D.   On: 12/14/2018 13:14        Scheduled  Meds:  sodium chloride   Intravenous Once   aspirin EC  81 mg Oral QHS   carvedilol  25 mg Oral BID   docusate sodium  100 mg Oral BID   DULoxetine  60 mg Oral BID   fenofibrate  160 mg Oral QHS   heparin  5,000 Units Subcutaneous Q8H   insulin aspart  0-5 Units Subcutaneous QHS   insulin aspart  0-9 Units Subcutaneous TID WC   insulin glargine  24 Units Subcutaneous QHS   lactobacillus acidophilus  2 tablet Oral Daily   levothyroxine  112 mcg Oral Q0600   metroNIDAZOLE  500 mg Oral Q8H   mupirocin ointment  1 application Nasal BID   pregabalin  300 mg Oral QHS   rosuvastatin  10 mg Oral Daily   sodium chloride flush  3 mL Intravenous Once   vancomycin variable dose per unstable renal function (pharmacist dosing)   Does not apply See admin instructions   Continuous Infusions:  sodium chloride 100 mL/hr at 12/15/18 0200   ceFEPime (MAXIPIME) IV       LOS: 1 day    Time spent: 30 minutes    Barb Merino, MD [facility] Pager [phone]  If 7PM-7AM, please contact night-coverage [email or web address] Password [facility] 12/15/2018, 12:55 PM

## 2018-12-16 ENCOUNTER — Encounter (HOSPITAL_COMMUNITY): Payer: Self-pay | Admitting: Orthopedic Surgery

## 2018-12-16 ENCOUNTER — Inpatient Hospital Stay (HOSPITAL_COMMUNITY): Payer: Medicare Other

## 2018-12-16 DIAGNOSIS — L0390 Cellulitis, unspecified: Secondary | ICD-10-CM

## 2018-12-16 LAB — CBC WITH DIFFERENTIAL/PLATELET
Abs Immature Granulocytes: 0.07 10*3/uL (ref 0.00–0.07)
Basophils Absolute: 0 10*3/uL (ref 0.0–0.1)
Basophils Relative: 0 %
Eosinophils Absolute: 0.1 10*3/uL (ref 0.0–0.5)
Eosinophils Relative: 1 %
HCT: 23.4 % — ABNORMAL LOW (ref 39.0–52.0)
Hemoglobin: 7.2 g/dL — ABNORMAL LOW (ref 13.0–17.0)
Immature Granulocytes: 1 %
Lymphocytes Relative: 6 %
Lymphs Abs: 0.4 10*3/uL — ABNORMAL LOW (ref 0.7–4.0)
MCH: 28.3 pg (ref 26.0–34.0)
MCHC: 30.8 g/dL (ref 30.0–36.0)
MCV: 92.1 fL (ref 80.0–100.0)
Monocytes Absolute: 0.4 10*3/uL (ref 0.1–1.0)
Monocytes Relative: 6 %
Neutro Abs: 6.1 10*3/uL (ref 1.7–7.7)
Neutrophils Relative %: 86 %
Platelets: 145 10*3/uL — ABNORMAL LOW (ref 150–400)
RBC: 2.54 MIL/uL — ABNORMAL LOW (ref 4.22–5.81)
RDW: 16 % — ABNORMAL HIGH (ref 11.5–15.5)
WBC: 7.1 10*3/uL (ref 4.0–10.5)
nRBC: 0 % (ref 0.0–0.2)

## 2018-12-16 LAB — BASIC METABOLIC PANEL
Anion gap: 13 (ref 5–15)
BUN: 80 mg/dL — ABNORMAL HIGH (ref 8–23)
CO2: 14 mmol/L — ABNORMAL LOW (ref 22–32)
Calcium: 7.4 mg/dL — ABNORMAL LOW (ref 8.9–10.3)
Chloride: 109 mmol/L (ref 98–111)
Creatinine, Ser: 7.06 mg/dL — ABNORMAL HIGH (ref 0.61–1.24)
GFR calc Af Amer: 8 mL/min — ABNORMAL LOW (ref >60)
GFR calc non Af Amer: 7 mL/min — ABNORMAL LOW (ref >60)
Glucose, Bld: 144 mg/dL — ABNORMAL HIGH (ref 70–99)
Potassium: 4.3 mmol/L (ref 3.5–5.1)
Sodium: 136 mmol/L (ref 135–145)

## 2018-12-16 LAB — GLUCOSE, CAPILLARY
Glucose-Capillary: 141 mg/dL — ABNORMAL HIGH (ref 70–99)
Glucose-Capillary: 207 mg/dL — ABNORMAL HIGH (ref 70–99)
Glucose-Capillary: 186 mg/dL — ABNORMAL HIGH (ref 70–99)
Glucose-Capillary: 196 mg/dL — ABNORMAL HIGH (ref 70–99)

## 2018-12-16 MED ORDER — LACTATED RINGERS IV SOLN
INTRAVENOUS | Status: DC
  Administered 2018-12-16 – 2018-12-17 (×2): via INTRAVENOUS

## 2018-12-16 MED ORDER — SODIUM CHLORIDE 0.9 % IV SOLN
INTRAVENOUS | Status: DC
  Administered 2018-12-16: 08:00:00 via INTRAVENOUS

## 2018-12-16 NOTE — Progress Notes (Signed)
ABI has been completed.   Preliminary results in CV Proc.   [name] 12/16/2018 1:32 PM

## 2018-12-16 NOTE — Progress Notes (Signed)
PROGRESS NOTE    [name]  QZR:[id number] DOB: [birth year] DOA: 12/14/2018 PCP: Fanny Bien, MD    Brief Narrative:  68 year old diabetic, hypertension and chronic right foot wound, chronic kidney disease stage IV presented to the emergency room with 5 to 6 days of worsening erythema around his chronic right foot wound.  Draining purulent material.  Noticed fever for 3 days.  Patient found to have worsening diabetic foot infection with osteomyelitis and admitted to the hospital.   Assessment & Plan:   Active Problems:   Diabetic foot ulcer (HCC)   HTN (hypertension)   Hypothyroidism   Diabetes mellitus ([place])   Osteomyelitis ([place])   AKI (acute kidney injury) ([place])   CKD (chronic kidney disease) stage 4, GFR 15-29 ml/min (HCC)   Cutaneous abscess of right foot  Diabetic foot ulcer with osteomyelitis: Continue broad-spectrum antibiotics.  Status post fourth and fifth metatarsal ray amputation.  Significant infection.  Continue antibiotics until final blood cultures and surgical cultures.  Postop wound care and wound VAC management as per surgery.  Adequate pain medications.  Acute kidney injury on chronic kidney stage IV: Patient has advanced kidney disease.  Adequate urine output.  Medications to dose with renal functions.  Renal functions worsening today.  He also had episode of hypotension perioperative. Discussed and consult placed to his nephrology team. We will start patient on IV hydration.  Strict intake and output monitoring.  Uncontrolled type 2 diabetes with peripheral neuropathy: On insulin.  Currently well controlled.  Latest A1c 6.3.  Continue similar doses of insulin.  Patient continue Lyrica and pain medications.  Hypertension: Blood pressures are adequate now.  Continue beta-blockers.  Hypothyroidism: On Synthroid.  Continue.  Acute on chronic anemia of chronic disease: Hemoglobin further down to 7.2.  No evidence of active bleeding.  Hold off on blood  transfusion today.  We will continue close monitoring.   DVT prophylaxis: Heparin subcu Code Status: Full code Family Communication: None Disposition Plan: Continue inpatient hospitalization.  Per surgery today.   Consultants:   Orthopedics  Nephrology  Procedures:  Right fourth fifth metatarsal ray amputation with wound VAC placement  Antimicrobials:  Cefepime, 12/14/2018>>> Vancomycin, 12/14/2018>>> Flagyl, 12/14/2018>>>   Subjective: Patient seen and examined.  No overnight events.  Afebrile.  Worsening renal functions noted.  He has adequate urine output as per him.  Minimum pain at the surgical site.  Objective: Vitals:   12/15/18 1829 12/15/18 2030 12/16/18 0050 12/16/18 0641  BP: (!) 100/55 122/69 131/67 (!) 97/56  Pulse: 91 89  67  Resp: 18  18 16   Temp: 98.8 F (37.1 C) 97.7 F (36.5 C) 97.8 F (36.6 C) 97.6 F (36.4 C)  TempSrc: Oral Oral Oral Axillary  SpO2: 96% 96% 98% 99%  Weight:      Height:        Intake/Output Summary (Last 24 hours) at 12/16/2018 1144 Last data filed at 12/16/2018 1027 Gross per 24 hour  Intake 1299 ml  Output 250 ml  Net 1049 ml   Filed Weights   12/14/18 1154 12/15/18 1536  Weight: 99.8 kg 99.8 kg    Examination:  General exam: Appears calm and comfortable, on room air.  Anxious.  Pale looking. Respiratory system: Clear to auscultation. Respiratory effort normal.  No added sounds. Cardiovascular system: S1 & S2 heard, RRR. No JVD, murmurs, rubs, gallops or clicks. No pedal edema. Gastrointestinal system: Abdomen is nondistended, soft and nontender. No organomegaly or masses felt. Normal bowel sounds heard. Central  nervous system: Alert and oriented. No focal neurological deficits. Extremities: Symmetric 5 x 5 power. Psychiatry: Judgement and insight appear normal. Mood & affect appropriate.  Right lateral foot: Postop wound, treated with wound VAC dressing, not removed by me.    Data Reviewed: I have personally reviewed  following labs and imaging studies  CBC: Recent Labs  Lab 12/14/18 1207 12/14/18 1739 12/15/18 0241 12/16/18 0248  WBC 13.0* 11.7* 10.0 7.1  NEUTROABS 11.4*  --   --  6.1  HGB 10.4* 8.7* 8.5* 7.2*  HCT 33.1* 26.2* 26.6* 23.4*  MCV 92.7 88.2 89.3 92.1  PLT 197 175 154 409*   Basic Metabolic Panel: Recent Labs  Lab 12/14/18 1207 12/14/18 1739 12/16/18 0248  NA 134*  --  136  K 4.5  --  4.3  CL 103  --  109  CO2 16*  --  14*  GLUCOSE 237*  --  144*  BUN 58*  --  80*  CREATININE 5.14* 5.18* 7.06*  CALCIUM 8.5*  --  7.4*   GFR: Estimated Creatinine Clearance: 11.7 mL/min (A) (by C-G formula based on SCr of 7.06 mg/dL (H)). Liver Function Tests: Recent Labs  Lab 12/14/18 1207  AST 27  ALT 25  ALKPHOS 46  BILITOT 0.6  PROT 6.7  ALBUMIN 3.0*   No results for input(s): LIPASE, AMYLASE in the last 168 hours. No results for input(s): AMMONIA in the last 168 hours. Coagulation Profile: Recent Labs  Lab 12/15/18 0241  INR 1.4*   Cardiac Enzymes: No results for input(s): CKTOTAL, CKMB, CKMBINDEX, TROPONINI in the last 168 hours. BNP (last 3 results) No results for input(s): PROBNP in the last 8760 hours. HbA1C: Recent Labs    12/14/18 1739  HGBA1C 6.3*   CBG: Recent Labs  Lab 12/15/18 0816 12/15/18 1221 12/15/18 1501 12/15/18 1719 12/16/18 0808  GLUCAP 220* 171* 116* 115* 141*   Lipid Profile: No results for input(s): CHOL, HDL, LDLCALC, TRIG, CHOLHDL, LDLDIRECT in the last 72 hours. Thyroid Function Tests: No results for input(s): TSH, T4TOTAL, FREET4, T3FREE, THYROIDAB in the last 72 hours. Anemia Panel: No results for input(s): VITAMINB12, FOLATE, FERRITIN, TIBC, IRON, RETICCTPCT in the last 72 hours. Sepsis Labs: Recent Labs  Lab 12/14/18 1207 12/14/18 1645  LATICACIDVEN 1.7 1.1    Recent Results (from the past 240 hour(s))  Wound or Superficial Culture     Status: None (Preliminary result)   Collection Time: 12/14/18 12:46 PM   Specimen:  Wound  Result Value Ref Range Status   Specimen Description WOUND  Final   Special Requests Immunocompromised  Final   Gram Stain   Final    RARE WBC PRESENT,BOTH PMN AND MONONUCLEAR FEW GRAM POSITIVE COCCI FEW GRAM POSITIVE RODS MODERATE GRAM NEGATIVE RODS    Culture   Final    CULTURE REINCUBATED FOR BETTER GROWTH Performed at [facility], [place] [address], [place], [place] [address]    Report Status PENDING  Incomplete  SARS Coronavirus 2 [facility] order, Performed in [facility] hospital lab) Nasopharyngeal Nasopharyngeal Swab     Status: None   Collection Time: 12/14/18  1:59 PM   Specimen: Nasopharyngeal Swab  Result Value Ref Range Status   SARS Coronavirus 2 NEGATIVE NEGATIVE Final    Comment: (NOTE) If result is NEGATIVE SARS-CoV-2 target nucleic acids are NOT DETECTED. The SARS-CoV-2 RNA is generally detectable in upper and lower  respiratory specimens during the acute phase of infection. The lowest  concentration of SARS-CoV-2 viral  copies this assay can detect is 250  copies / mL. A negative result does not preclude SARS-CoV-2 infection  and should not be used as the sole basis for treatment or other  patient management decisions.  A negative result may occur with  improper specimen collection / handling, submission of specimen other  than nasopharyngeal swab, presence of viral mutation(s) within the  areas targeted by this assay, and inadequate number of viral copies  (<250 copies / mL). A negative result must be combined with clinical  observations, patient history, and epidemiological information. If result is POSITIVE SARS-CoV-2 target nucleic acids are DETECTED. The SARS-CoV-2 RNA is generally detectable in upper and lower  respiratory specimens dur ing the acute phase of infection.  Positive  results are indicative of active infection with SARS-CoV-2.  Clinical  correlation with patient history and other diagnostic information is  necessary to determine  patient infection status.  Positive results do  not rule out bacterial infection or co-infection with other viruses. If result is PRESUMPTIVE POSTIVE SARS-CoV-2 nucleic acids MAY BE PRESENT.   A presumptive positive result was obtained on the submitted specimen  and confirmed on repeat testing.  While 2019 novel coronavirus  (SARS-CoV-2) nucleic acids may be present in the submitted sample  additional confirmatory testing may be necessary for epidemiological  and / or clinical management purposes  to differentiate between  SARS-CoV-2 and other Sarbecovirus currently known to infect humans.  If clinically indicated additional testing with an alternate test  methodology [phone]) is advised. The SARS-CoV-2 RNA is generally  detectable in upper and lower respiratory sp ecimens during the acute  phase of infection. The expected result is Negative. Fact Sheet for Patients:  StrictlyIdeas.no Fact Sheet for Healthcare Providers: BankingDealers.co.za This test is not yet approved or cleared by the [place] FDA and has been authorized for detection and/or diagnosis of SARS-CoV-2 by FDA under an Emergency Use Authorization (EUA).  This EUA will remain in effect (meaning this test can be used) for the duration of the COVID-19 declaration under Section 564(b)(1) of the Act, 21 U.S.C. section 360bbb-3(b)(1), unless the authorization is terminated or revoked sooner. Performed at [facility], [place] [address] [place], [place] [address]   Blood culture (routine x 2)     Status: None (Preliminary result)   Collection Time: 12/14/18  2:35 PM   Specimen: BLOOD  Result Value Ref Range Status   Specimen Description BLOOD LEFT ANTECUBITAL  Final   Special Requests AEROBIC BOTTLE ONLY Blood Culture adequate volume  Final   Culture   Final    NO GROWTH 2 DAYS Performed at [facility], [place] [address], [place], [place] [address]    Report  Status PENDING  Incomplete  Surgical PCR screen     Status: None   Collection Time: 12/14/18  4:34 PM   Specimen: Nasal Mucosa; Nasal Swab  Result Value Ref Range Status   MRSA, PCR NEGATIVE NEGATIVE Final   Staphylococcus aureus NEGATIVE NEGATIVE Final    Comment: (NOTE) The Xpert SA Assay (FDA approved for NASAL specimens in patients 64 years of age and older), is one component of a comprehensive surveillance program. It is not intended to diagnose infection nor to guide or monitor treatment. Performed at [facility], [place] [address] [place], [place] [address]   Blood culture (routine x 2)     Status: None (Preliminary result)   Collection Time: 12/14/18  4:45 PM   Specimen: BLOOD  Result Value Ref Range Status   Specimen Description BLOOD LEFT ANTECUBITAL  Final   Special Requests   Final    BOTTLES DRAWN AEROBIC ONLY Blood Culture adequate volume   Culture   Final    NO GROWTH 2 DAYS Performed at [facility], [address] [place], [place] [address]    Report Status PENDING  Incomplete  Aerobic/Anaerobic Culture (surgical/deep wound)     Status: None (Preliminary result)   Collection Time: 12/15/18  5:02 PM   Specimen: Soft Tissue, Other  Result Value Ref Range Status   Specimen Description TISSUE RIGHT FOOT  Final   Special Requests NONE  Final   Gram Stain   Final    FEW WBC PRESENT,BOTH PMN AND MONONUCLEAR FEW GRAM POSITIVE COCCI FEW GRAM NEGATIVE RODS RARE GRAM POSITIVE RODS    Culture   Final    CULTURE REINCUBATED FOR BETTER GROWTH Performed at [facility], [place] [address] [place], [place] [address]    Report Status PENDING  Incomplete         Radiology Studies: Dg Chest 2 View  Result Date: 12/14/2018 CLINICAL DATA:  Fever. EXAM: CHEST - 2 VIEW COMPARISON:  December 24, 2015 FINDINGS: Stable postsurgical changes. Cardiomediastinal silhouette is normal. Mediastinal contours appear intact. There is no evidence of focal airspace  consolidation, pleural effusion or pneumothorax. Osseous structures are without acute abnormality. Soft tissues are grossly normal. IMPRESSION: No active cardiopulmonary disease. Electronically Signed   By: Fidela Salisbury M.D.   On: 12/14/2018 13:11   Mr Foot Right Wo Contrast  Result Date: 12/14/2018 CLINICAL DATA:  O: On the lateral aspect around the right fifth digit. EXAM: MRI OF THE RIGHT FOREFOOT WITHOUT CONTRAST TECHNIQUE: Multiplanar, multisequence MR imaging of the right forefoot was performed. No intravenous contrast was administered. COMPARISON:  None. FINDINGS: Bones/Joint/Cartilage There is increased T2 signal involving the fifth metatarsal head with subtle T1 hypointensity seen along the lateral margin best seen on series 4, image 8. There is also increased T2 signal seen involving the fifth metatarsal head diffusely and the fifth phalanges. There is also mildly increased STIR signal seen at the fourth proximal and distal phalanges. No areas cortical destruction or bony erosion are seen. No cysts seen at T1 hypointensity are noted. Ligaments The Lisfranc ligaments appear to be intact. Muscles and Tendons There is mild fatty atrophy noted within the muscles surrounding the forefoot. There is diffusely increased signal seen within the musculature surrounding the forefoot however. There is fluid signal surrounding the fifth extensor digitorum tendon at the level of the metatarsal head best seen on series 5, image 8. The remainder of the flexor extensor tendons are intact. Soft tissues Overlying skin ulceration seen along the lateral aspect of the foot measuring 1.3 cm in length with diffuse subcutaneous edema. There is subcutaneous emphysema seen surrounding the dorsal aspect of the fifth digit and extending into the interdigital space. IMPRESSION: 1. Reactive marrow versus early osteomyelitis involving the lateral aspect of the fifth metatarsal head. No destructive changes however. Overlying area  of skin ulceration and diffuse edema. No soft tissue abscess. 2. Reactive marrow involving the fifth and fourth digits as described above. 3. Fifth extensor digitorum tenosynovitis 4. Subcutaneous emphysema on the dorsal aspect of the fifth digit and interdigital space. Electronically Signed   By: Prudencio Pair M.D.   On: 12/14/2018 20:02   Dg Foot Complete Right  Result Date: 12/14/2018 CLINICAL DATA:  Infection fifth toe EXAM: RIGHT  FOOT COMPLETE - 3+ VIEW COMPARISON:  None. FINDINGS: Soft tissue swelling and extensive soft tissue air the level of the fifth toe and metatarsal head. Loss cortical distinctness of the medial aspect of the fifth metatarsal head is suspicious for acute osteomyelitis. No fracture identified. No dislocation. Mild degenerative changes within the midfoot. Prominent calcaneal enthesophyte. Vascular calcifications. IMPRESSION: 1. Findings suspicious for osteomyelitis of the fifth metatarsal head. 2. Soft tissue swelling with air within the soft tissues at the level of the fifth digit and fifth metatarsal head suggesting ulceration. No soft tissue gas is seen tracking proximally within the forefoot or hindfoot. Electronically Signed   By: Davina Poke M.D.   On: 12/14/2018 13:14        Scheduled Meds:  sodium chloride   Intravenous Once   aspirin EC  81 mg Oral QHS   carvedilol  25 mg Oral BID   docusate sodium  100 mg Oral BID   DULoxetine  60 mg Oral BID   feeding supplement (PRO-STAT SUGAR FREE 64)  30 mL Oral TID BM   fenofibrate  160 mg Oral QHS   heparin  5,000 Units Subcutaneous Q8H   insulin aspart  0-5 Units Subcutaneous QHS   insulin aspart  0-9 Units Subcutaneous TID WC   insulin glargine  24 Units Subcutaneous QHS   lactobacillus acidophilus  2 tablet Oral Daily   levothyroxine  112 mcg Oral Q0600   metroNIDAZOLE  500 mg Oral Q8H   multivitamin  1 tablet Oral QHS   mupirocin ointment  1 application Nasal BID   pregabalin  300 mg Oral  QHS   rosuvastatin  10 mg Oral Daily   sodium chloride flush  3 mL Intravenous Once   vancomycin variable dose per unstable renal function (pharmacist dosing)   Does not apply See admin instructions   Continuous Infusions:  sodium chloride 10 mL/hr at 12/15/18 1840   sodium chloride 150 mL/hr at 12/16/18 0755   ceFEPime (MAXIPIME) IV 1 g (12/16/18 1127)   methocarbamol (ROBAXIN) IV       LOS: 2 days    Time spent: 30 minutes    Barb Merino, MD [facility] Pager [phone]  If 7PM-7AM, please contact night-coverage [email or web address] Password [email or web address] 12/16/2018, 11:44 AM

## 2018-12-16 NOTE — Progress Notes (Signed)
Orthopedic Tech Progress Note Patient Details:  [name] Jan 16, 1951 [id number]  Ortho Devices Type of Ortho Device: Postop shoe/boot Ortho Device/Splint Location: rle. Ortho Device/Splint Interventions: Ordered, Application, Adjustment   Post Interventions Patient Tolerated: Well Instructions Provided: Care of device, Adjustment of device   Karolee Stamps 12/16/2018, 7:22 AM

## 2018-12-16 NOTE — Progress Notes (Signed)
Late Entry for 12/16/18: Patient seen and assessed. Assessment entered via computerized charting per [facility]. No acute distress noted during assessment. Wound vac noted to right foot at 125 mm Hg. No drainage noted. No family present at bedside. Side rails up x 2. Bed in lowest position and locked. No family present at bedside. Continue to monitor

## 2018-12-16 NOTE — Consult Note (Signed)
Francis Dowse Admit Date: 12/14/2018 12/16/2018 Rexene Agent Requesting Physician:  Raelyn Mora MD  Reason for Consult:  [id number] HPI:  68 year old male admitted 9/7 after presenting with progressive diabetic foot wound on the right side and concern for infection.  He had accompanying fevers/chills and purulent drainage from the site.  He was found to have a mild leukocytosis.  Seen by orthopedics and underwent MRI with concerning for deep infection and concern for osteomyelitis.  On 9/8 he underwent debridement of the wound and fourth and fifth toe amputations, followed by wound VAC placement.  Antibiotics have been vancomycin, cefepime, metronidazole.  Other past history includes hypertension on beta-blocker/CCB, type 2 diabetes, longstanding, hypothyroidism.  He follows with Dr. Hollie Salk in our office.  He has CKD 4 and creatinine on 8/21 was 4.4.  Presenting serum creatinine was 5.1 and has progressed to 7.1 today.  Other labs are notable for potassium 4.3 and serum bicarbonate of 14 with anion gap of 13.  Urine analysis without hematuria, pyuria.  He has 4+ proteinuria consistent with previous values.  There is some mention of hypotension in the perioperative setting.  No exposure to IV contrast.  No NSAIDs during or prior to this admission.  No issues with lower urinary tract symptoms.  Does not currently have a Foley catheter.   Creatinine, Ser (mg/dL)  Date Value  12/16/2018 7.06 (H)  12/14/2018 5.18 (H)  12/14/2018 5.14 (H)  12/27/2015 2.52 (H)  12/26/2015 2.66 (H)  12/25/2015 2.93 (H)  12/24/2015 2.75 (H)  12/28/2014 2.22 (H)  12/27/2013 2.00 (H)  12/26/2013 1.69 (H)  ] I/Os:  ROS NSAIDS: No exposure IV Contrast no exposure TMP/SMX no exposure Hypotension?,  In OR Balance of 12 systems is negative w/ exceptions as above  PMH  Past Medical History:  Diagnosis Date  . Cervical myelopathy ([place]) 02/06/2018  . Coronary artery disease   . Diabetes mellitus without complication  ([place])   . Diabetic neuropathy ([place]) 02/06/2018  . Gait abnormality 08/24/2018  . Hypercholesteremia   . Hypertension   . Neuropathy   . Prostate cancer ([place])    [place]  Past Surgical History:  Procedure Laterality Date  . AMPUTATION Left 12/25/2013   Procedure: AMPUTATION RAY LEFT 5TH RAY;  Surgeon: [name], MD;  Location: [facility];  Service: Orthopedics;  Laterality: Left;  . AMPUTATION Right 12/15/2018   Procedure: AMPUTATION OF 4TH AND 5TH TOES RIGHT FOOT;  Surgeon: [name], MD;  Location: [facility];  Service: Orthopedics;  Laterality: Right;  . APPLICATION OF WOUND VAC Right 12/15/2018   Procedure: APPLICATION OF WOUND VAC;  Surgeon: [name], MD;  Location: [facility];  Service: Orthopedics;  Laterality: Right;  . BACK SURGERY    . CHOLECYSTECTOMY    . CORONARY ARTERY BYPASS GRAFT    . I&D EXTREMITY Right 12/15/2018   Procedure: DEBRIDEMENT RIGHT FOOT;  Surgeon: [name], MD;  Location: [facility];  Service: Orthopedics;  Laterality: Right;  . NECK SURGERY     novemver 2019   FH  Family History  Problem Relation Age of Onset  . Diabetes Mellitus II Mother   . Diabetes Mellitus II Father   . CAD Father   . Cancer Father        prostate  . Diabetes Mellitus II Brother   . Diabetes Mellitus II Brother   . Cancer Brother    [place]  reports that he quit smoking about 29 years ago. His smoking use included  cigars. He has never used smokeless tobacco. He reports that he does not drink alcohol or use drugs. Allergies  Allergies  Allergen Reactions  . Mushroom Extract Complex Nausea Only   Home medications Prior to Admission medications   Medication Sig Start Date End Date Taking? Authorizing Provider  acetaminophen (TYLENOL) 500 MG tablet Take 1,500 mg by mouth every 6 (six) hours as needed (pain).   Yes [provider]  allopurinol (ZYLOPRIM) 100 MG tablet Take 200 mg by mouth every morning.  05/04/18  Yes [provider]  Alprostadil, Vasodilator, (CAVERJECT  IC) 1 Dose by Intracavernosal route daily as needed (erectile dysfunction).   Yes [provider]  amLODipine (NORVASC) 5 MG tablet Take 5 mg by mouth every morning.  03/26/18  Yes [provider]  aspirin EC 81 MG tablet Take 81 mg by mouth every morning.    Yes [provider]  calcitRIOL (ROCALTROL) 0.25 MCG capsule Take 0.25 mcg by mouth every morning.  03/22/18  Yes [provider]  carvedilol (COREG) 25 MG tablet Take 25 mg by mouth 2 (two) times daily.  11/22/16  Yes [provider]  diclofenac sodium (VOLTAREN) 1 % GEL Apply 1 application topically 3 (three) times daily as needed (pain).    Yes [provider]  docusate sodium (STOOL SOFTENER) 100 MG capsule Take 100 mg by mouth 2 (two) times daily.   Yes [provider]  doxycycline (VIBRA-TABS) 100 MG tablet TAKE 1 TABLET BY MOUTH TWICE A DAY Patient taking differently: Take 100 mg by mouth 2 (two) times daily.  11/17/18  Yes Suzan Slick, NP  DULoxetine (CYMBALTA) 60 MG capsule Take 1 capsule (60 mg total) by mouth 2 (two) times daily. 08/24/18  Yes Kathrynn Ducking, MD  fenofibrate 160 MG tablet Take 160 mg by mouth at bedtime. 10/26/18  Yes [provider]  Insulin Degludec (TRESIBA FLEXTOUCH) 200 UNIT/ML SOPN Inject 40 Units into the skin at bedtime. Patient taking differently: Inject 24 Units into the skin at bedtime.  12/27/15  Yes Domenic Polite, MD  levothyroxine (SYNTHROID, LEVOTHROID) 112 MCG tablet Take 112 mcg by mouth daily before breakfast. BRAND NAME SYNTHROID   Yes [provider]  LYRICA 150 MG capsule Take 1 capsule (150 mg total) by mouth at bedtime. Patient taking differently: Take 150 mg by mouth 2 (two) times daily.  02/06/18  Yes Kathrynn Ducking, MD  OVER THE COUNTER MEDICATION Take 1 tablet by mouth 3 (three) times daily as needed (leg cramps). Hyland's Leg Cramps   Yes [provider]  Probiotic Product (PROBIOTIC PO) Take 1  tablet by mouth every morning.   Yes [provider]  rosuvastatin (CRESTOR) 10 MG tablet Take 10 mg by mouth at bedtime.    Yes [provider]  Semaglutide, 1 MG/DOSE, (OZEMPIC, 1 MG/DOSE,) 2 MG/1.5ML SOPN Inject 1 mg into the skin every Friday.   Yes [provider]  triamcinolone (KENALOG) 0.025 % cream Apply 1 application topically daily as needed (psoriasis).   Yes [provider]  predniSONE (DELTASONE) 10 MG tablet TAKE 2 TABLETS (20 MG TOTAL) BY MOUTH DAILY WITH BREAKFAST. Patient not taking: Reported on 12/14/2018 11/03/18   [name], MD    Current Medications Scheduled Meds: . sodium chloride   Intravenous Once  . aspirin EC  81 mg Oral QHS  . carvedilol  25 mg Oral BID  . docusate sodium  100 mg Oral BID  . DULoxetine  60 mg Oral BID  . feeding supplement (PRO-STAT SUGAR FREE 64)  30 mL Oral TID BM  . fenofibrate  160 mg Oral QHS  . heparin  5,000 Units Subcutaneous Q8H  . insulin aspart  0-5 Units Subcutaneous QHS  . insulin aspart  0-9 Units Subcutaneous TID WC  . insulin glargine  24 Units Subcutaneous QHS  . lactobacillus acidophilus  2 tablet Oral Daily  . levothyroxine  112 mcg Oral Q0600  . metroNIDAZOLE  500 mg Oral Q8H  . multivitamin  1 tablet Oral QHS  . mupirocin ointment  1 application Nasal BID  . pregabalin  300 mg Oral QHS  . rosuvastatin  10 mg Oral Daily  . sodium chloride flush  3 mL Intravenous Once  . vancomycin variable dose per unstable renal function (pharmacist dosing)   Does not apply See admin instructions   Continuous Infusions: . sodium chloride 10 mL/hr at 12/15/18 1840  . sodium chloride 150 mL/hr at 12/16/18 0755  . ceFEPime (MAXIPIME) IV 1 g (12/16/18 1127)  . methocarbamol (ROBAXIN) IV     PRN Meds:.acetaminophen **OR** acetaminophen, bisacodyl, HYDROcodone-acetaminophen, HYDROmorphone (DILAUDID) injection, methocarbamol **OR** methocarbamol (ROBAXIN) IV, metoCLOPramide **OR** metoCLOPramide  (REGLAN) injection, ondansetron **OR** ondansetron (ZOFRAN) IV, oxyCODONE, oxyCODONE, polyethylene glycol  CBC Recent Labs  Lab 12/14/18 1207 12/14/18 1739 12/15/18 0241 12/16/18 0248  WBC 13.0* 11.7* 10.0 7.1  NEUTROABS 11.4*  --   --  6.1  HGB 10.4* 8.7* 8.5* 7.2*  HCT 33.1* 26.2* 26.6* 23.4*  MCV 92.7 88.2 89.3 92.1  PLT 197 175 154 277*   Basic Metabolic Panel Recent Labs  Lab 12/14/18 1207 12/14/18 1739 12/16/18 0248  NA 134*  --  136  K 4.5  --  4.3  CL 103  --  109  CO2 16*  --  14*  GLUCOSE 237*  --  144*  BUN 58*  --  80*  CREATININE 5.14* 5.18* 7.06*  CALCIUM 8.5*  --  7.4*    Physical Exam  Blood pressure (!) 112/57, pulse 67, temperature (!) 97.4 F (36.3 C), temperature source Oral, resp. rate 16, height 5\' 10"  (1.778 m), weight 99.8 kg, SpO2 100 %. GEN: NAD, sitting in chair, cordial ENT: NCAT EYES: EOMI CV: RRR, normal S1 and S2, no rub PULM: CTA B, normal work of breathing ABD: Soft, nontender SKIN: Wound VAC on right lateral distal foot EXT: No edema in the left leg No asterixis, CN II through XII grossly intact   Assessment 68 year old male with infected diabetic foot wound status post debridement and amputation of fourth and fifth toes of the right foot with acute on chronic renal failure.  1. CKD 5 with AKI, it appears he has progressive diabetic nephropathy and the acute insult surrounding the infection and amputation.  No clear nephrotoxic exposures.  Currently receiving hydration with normal saline at 150 mL/hr.  No uremia.  No indications for RRT at the current time. 2. Infected diabetic foot wound status post amputation of fourth and fifth toes on 9/8 with Dr. [name].  On Vanco/cefepime/Flagyl 3. Metabolic acidosis with anion gap of 13, likely driven by #1.  Potentially to worsen with normal saline.  Follow closely. 4. Anemia, hemoglobin 10.4 at presentation, now 7.2; not surprising, asymptomatic, transfusion per primary.  Check iron  levels 5. DM2, A1c 6.3% 6. HTN, BPs currently stable  Plan 1. Largely as above, stop normal saline, will hydrate with LR at 75 mL/h overnight 2. Check iron studies 3. Have discussed  that patient has progressive renal disease and even if avoiding this current hospitalization, likely to need dialysis sooner than later.  Transplant currently not feasible with his diabetic foot wound.  We will continue to dialogue with patient. 4. Daily weights, Daily Renal Panel, Strict I/Os, Avoid nephrotoxins (NSAIDs, judicious IV Contrast)    Pearson Grippe MD 12/16/2018, 3:36 PM

## 2018-12-16 NOTE — Evaluation (Signed)
Physical Therapy Evaluation Patient Details Name: [name] MRN: [id number] DOB: [birth year] Today's Date: 12/16/2018   History of Present Illness  68 year old male with significant PMH including diabeties, hypertension and chronic right foot wound, chronic kidney disease stage IV presented to the emergency room with 5 to 6 days of worsening erythema around his chronic right foot wound. s/p I&D of R foot and amputation of 4th and 5th toes on R foot, now NWB with wound vac.  Clinical Impression  Pt presents with an overall decrease in functional mobility secondary to above. PTA, pt was independent for all mobility. Educ on precautions, positioning, therex, and importance of mobility. Today, pt able to tolerate gait training with RW min assist, max cues throughout for R LE NWB precautions, pt with difficulty maintaining this. Pt would benefit from continued acute PT services to maximize functional mobility and independence prior to d/c home with home health services to address balance and strength deficits.      Follow Up Recommendations Home health PT;Supervision - Intermittent;Supervision for mobility/OOB    Equipment Recommendations  Other (comment)(determine whether pt will need w/c)    Recommendations for Other Services       Precautions / Restrictions Precautions Precautions: Fall Required Braces or Orthoses: Other Brace Other Brace: R fracture shoe Restrictions Weight Bearing Restrictions: Yes RLE Weight Bearing: Non weight bearing      Mobility  Bed Mobility Overal bed mobility: Needs Assistance Bed Mobility: Supine to Sit     Supine to sit: Min assist     General bed mobility comments: min assist to initate movement this AM, assisting with R LE movement off the bed  Transfers Overall transfer level: Needs assistance Equipment used: Rolling walker (2 wheeled) Transfers: Sit to/from Stand Sit to Stand: Min assist         General transfer comment: sit<>stand from  EOB with RW and min assist, cues for hand placement and for R LE NWB precautions  Ambulation/Gait Ambulation/Gait assistance: Min assist Gait Distance (Feet): 10 Feet Assistive device: Rolling walker (2 wheeled)   Gait velocity: decreased Gait velocity interpretation: <1.31 ft/sec, indicative of household ambulator General Gait Details: swing to pattern for R LE NWB with RW, cues for precaution as pt had difficulty maintaining throughout  Stairs            Wheelchair Mobility    Modified Rankin (Stroke Patients Only)       Balance Overall balance assessment: Needs assistance Sitting-balance support: Feet supported Sitting balance-Leahy Scale: Fair Sitting balance - Comments: supervision for sitting balance     Standing balance-Leahy Scale: Poor Standing balance comment: reliant on UE support using RW                             Pertinent Vitals/Pain Pain Assessment: Faces Faces Pain Scale: Hurts little more Pain Location: R foot Pain Descriptors / Indicators: Aching;Grimacing;Operative site guarding Pain Intervention(s): Limited activity within patient's tolerance;Monitored during session    [place] expects to be discharged to:: Private residence Living Arrangements: Spouse/significant other   Type of Home: House Home Access: Stairs to enter Entrance Stairs-Rails: None Entrance Stairs-Number of Steps: 1 Home Layout: One level(with one step to get into den) Home Equipment: [name] - 2 wheels      Prior Function Level of Independence: Independent               Hand Dominance  Extremity/Trunk Assessment   Upper Extremity Assessment Upper Extremity Assessment: Overall WFL for tasks assessed    Lower Extremity Assessment Lower Extremity Assessment: Generalized weakness;RLE deficits/detail RLE Deficits / Details: R toe amputation, peripheral neuropathy affect strength and sensation. Strength WFL at hip and  knee RLE: Unable to fully assess due to pain    Cervical / Trunk Assessment Cervical / Trunk Assessment: Normal  Communication   Communication: No difficulties  Cognition Arousal/Alertness: Awake/alert Behavior During Therapy: WFL for tasks assessed/performed Overall Cognitive Status: Within Functional Limits for tasks assessed                                 General Comments: Pt overall awake/alert this AM, some drowsiness noted and slow to initiate      General Comments General comments (skin integrity, edema, etc.): wound vac R LE    Exercises     Assessment/Plan    PT Assessment Patient needs continued PT services  PT Problem List Decreased strength;Decreased balance;Pain;Decreased mobility;Decreased activity tolerance;Decreased coordination;Decreased skin integrity;Impaired sensation       PT Treatment Interventions DME instruction;Functional mobility training;Balance training;Patient/family education;Gait training;Therapeutic activities;Therapeutic exercise    PT Goals (Current goals can be found in the Care Plan section)  Acute Rehab PT Goals Patient Stated Goal: "go home" PT Goal Formulation: With patient Time For Goal Achievement: 12/30/18 Potential to Achieve Goals: Good    Frequency Min 3X/week   Barriers to discharge Inaccessible home environment 1 STE and 1 step into den    Co-evaluation               AM-PAC PT "6 Clicks" Mobility  Outcome Measure Help needed turning from your back to your side while in a flat bed without using bedrails?: A Little Help needed moving from lying on your back to sitting on the side of a flat bed without using bedrails?: A Little Help needed moving to and from a bed to a chair (including a wheelchair)?: A Little Help needed standing up from a chair using your arms (e.g., wheelchair or bedside chair)?: A Little Help needed to walk in hospital room?: A Little Help needed climbing 3-5 steps with a  railing? : A Lot 6 Click Score: 17    End of Session Equipment Utilized During Treatment: Gait belt Activity Tolerance: Patient tolerated treatment well;Patient limited by fatigue Patient left: in chair;with chair alarm set;with call bell/phone within reach Nurse Communication: Mobility status PT Visit Diagnosis: Unsteadiness on feet (R26.81);Muscle weakness (generalized) (M62.81);Pain Pain - Right/Left: Right Pain - part of body: Ankle and joints of foot    Time:  -      Charges:              Netta Corrigan, PT, DPT Acute Rehab Office [place] 12/16/2018, 9:45 AM

## 2018-12-16 NOTE — TOC Initial Note (Signed)
Transition of Care [facility]) - Initial/Assessment Note    Patient Details  Name: [name] MRN: [id number] Date of Birth: 08-29-1950  [facility] CM/SW Contact:    Benard Halsted, LCSW Phone Number: 12/16/2018, 2:14 PM  Clinical Narrative:                 CSW received consult for possible home health services at time of discharge. CSW spoke with patient regarding PT recommendation of Home Health PT at time of discharge. Patient reported that he would like home health services, though he eventually wants to get back to work. Patient reports he has never had home health before and does not have a preference. CSW discussed equipment needs with patient and he requested a wheelchair if he needs one. CSW will reach out to [employer] for delivery to the room once closer to discharge. CSW confirmed PCP and address with patient. Patient states his wife will come pick him up at discharge. No further questions reported at this time. CSW to continue to follow and assist with discharge planning needs.   Expected Discharge Plan: Opp Barriers to Discharge: Continued Medical Work up   Patient Goals and CMS Choice Patient states their goals for this hospitalization and ongoing recovery are:: Return to work [employer].gov Compare Post Acute Care list provided to:: Patient Choice offered to / list presented to : Patient  Expected Discharge Plan and Services Expected Discharge Plan: [place] In-house Referral: NA Discharge Planning Services: CM Consult Post Acute Care Choice: Durable Medical Equipment, Home Health Living arrangements for the past 2 months: Single Family Home                 DME Arranged: Wheelchair manual         HH Arranged: PT, RN          Prior Living Arrangements/Services Living arrangements for the past 2 months: Single Family Home Lives with:: Spouse Patient language and need for interpreter reviewed:: Yes Do you feel  safe going back to the place where you live?: Yes      Need for Family Participation in Patient Care: No (Comment) Care giver support system in place?: Yes (comment) Current home services: DME Criminal Activity/Legal Involvement Pertinent to Current Situation/Hospitalization: No - Comment as needed  Activities of Daily Living      Permission Sought/Granted Permission sought to share information with : Facility Art therapist granted to share information with : Yes, Verbal Permission Granted     Permission granted to share info w AGENCY: Home Health        Emotional Assessment Appearance:: Appears stated age Attitude/Demeanor/Rapport: Gracious Affect (typically observed): Accepting, Appropriate Orientation: : Oriented to Self, Oriented to Place, Oriented to  Time, Oriented to Situation Alcohol / Substance Use: Not Applicable Psych Involvement: No (comment)  Admission diagnosis:  Osteomyelitis of right foot, unspecified type [facility]) [M86.9] Patient Active Problem List   Diagnosis Date Noted  . Cutaneous abscess of right foot   . Osteomyelitis ([place]) 12/14/2018  . AKI (acute kidney injury) ([place]) 12/14/2018  . CKD (chronic kidney disease) stage 4, GFR 15-29 ml/min (HCC) 12/14/2018  . Gait abnormality 08/24/2018  . Diabetic neuropathy ([place]) 02/06/2018  . Cervical myelopathy ([place]) 02/06/2018  . Onychomycosis 10/30/2017  . Other spondylosis with radiculopathy, cervical region 01/27/2017  . Midfoot ulcer, left, limited to breakdown of skin ([place]) 11/15/2016  . Lateral epicondylitis, left elbow 08/12/2016  . Cellulitis of  fifth toe of right foot 07/29/2016  . Prostate cancer ([place]) 06/19/2016  . Non-pressure chronic ulcer of other part of right foot limited to breakdown of skin ([place]) 04/04/2016  . Cellulitis of leg, right 12/24/2015  . Cellulitis of right lower extremity 12/24/2015  . CKD (chronic kidney disease), stage III ([place]) 12/25/2013  . Foot infection  12/24/2013  . Foot ulcer, left ([place]) 12/24/2013  . Diabetic foot ulcer ([place]) 09/14/2012  . Gout 09/14/2012  . HTN (hypertension) 09/14/2012  . Hypothyroidism 09/14/2012  . CAD (coronary artery disease) 09/14/2012  . Diabetes mellitus ([place]) 09/14/2012  . Hypercholesteremia   . Neuropathy ([place])    PCP:  Fanny Bien, MD Pharmacy:   [id number] - [place], [address]. [address] [place] [address] Phone: [phone] Fax: [phone]     Social Determinants of Health (SDOH) Interventions    Readmission Risk Interventions Readmission Risk Prevention Plan 12/16/2018  Transportation Screening Complete  PCP or Specialist Appt within 5-7 Days Complete  Home Care Screening Complete  Medication Review (RN CM) Complete  Some recent data might be hidden

## 2018-12-16 NOTE — Plan of Care (Signed)

## 2018-12-17 LAB — CBC WITH DIFFERENTIAL/PLATELET
Abs Immature Granulocytes: 0.2 10*3/uL — ABNORMAL HIGH (ref 0.00–0.07)
Basophils Absolute: 0 10*3/uL (ref 0.0–0.1)
Basophils Relative: 0 %
Eosinophils Absolute: 0.1 10*3/uL (ref 0.0–0.5)
Eosinophils Relative: 3 %
HCT: 22.7 % — ABNORMAL LOW (ref 39.0–52.0)
Hemoglobin: 7.2 g/dL — ABNORMAL LOW (ref 13.0–17.0)
Immature Granulocytes: 4 %
Lymphocytes Relative: 8 %
Lymphs Abs: 0.4 10*3/uL — ABNORMAL LOW (ref 0.7–4.0)
MCH: 29 pg (ref 26.0–34.0)
MCHC: 31.7 g/dL (ref 30.0–36.0)
MCV: 91.5 fL (ref 80.0–100.0)
Monocytes Absolute: 0.3 10*3/uL (ref 0.1–1.0)
Monocytes Relative: 6 %
Neutro Abs: 4.1 10*3/uL (ref 1.7–7.7)
Neutrophils Relative %: 79 %
Platelets: 159 10*3/uL (ref 150–400)
RBC: 2.48 MIL/uL — ABNORMAL LOW (ref 4.22–5.81)
RDW: 16.1 % — ABNORMAL HIGH (ref 11.5–15.5)
WBC: 5.1 10*3/uL (ref 4.0–10.5)
nRBC: 0 % (ref 0.0–0.2)

## 2018-12-17 LAB — BASIC METABOLIC PANEL
Anion gap: 11 (ref 5–15)
BUN: 90 mg/dL — ABNORMAL HIGH (ref 8–23)
CO2: 16 mmol/L — ABNORMAL LOW (ref 22–32)
Calcium: 7.5 mg/dL — ABNORMAL LOW (ref 8.9–10.3)
Chloride: 107 mmol/L (ref 98–111)
Creatinine, Ser: 7.14 mg/dL — ABNORMAL HIGH (ref 0.61–1.24)
GFR calc Af Amer: 8 mL/min — ABNORMAL LOW (ref >60)
GFR calc non Af Amer: 7 mL/min — ABNORMAL LOW (ref >60)
Glucose, Bld: 156 mg/dL — ABNORMAL HIGH (ref 70–99)
Potassium: 4.2 mmol/L (ref 3.5–5.1)
Sodium: 134 mmol/L — ABNORMAL LOW (ref 135–145)

## 2018-12-17 LAB — VANCOMYCIN, RANDOM: Vancomycin Rm: 12

## 2018-12-17 LAB — IRON AND TIBC
Iron: 30 ug/dL — ABNORMAL LOW (ref 45–182)
Saturation Ratios: 19 % (ref 17.9–39.5)
TIBC: 158 ug/dL — ABNORMAL LOW (ref 250–450)
UIBC: 128 ug/dL

## 2018-12-17 LAB — FERRITIN: Ferritin: 2007 ng/mL — ABNORMAL HIGH (ref 24–336)

## 2018-12-17 LAB — AEROBIC CULTURE W GRAM STAIN (SUPERFICIAL SPECIMEN)

## 2018-12-17 LAB — GLUCOSE, CAPILLARY
Glucose-Capillary: 141 mg/dL — ABNORMAL HIGH (ref 70–99)
Glucose-Capillary: 179 mg/dL — ABNORMAL HIGH (ref 70–99)
Glucose-Capillary: 157 mg/dL — ABNORMAL HIGH (ref 70–99)
Glucose-Capillary: 156 mg/dL — ABNORMAL HIGH (ref 70–99)

## 2018-12-17 MED ORDER — VANCOMYCIN HCL IN DEXTROSE 1-5 GM/200ML-% IV SOLN
1000.0000 mg | Freq: Once | INTRAVENOUS | Status: AC
  Administered 2018-12-17: 06:00:00 1000 mg via INTRAVENOUS
  Filled 2018-12-17: qty 200

## 2018-12-17 MED ORDER — SODIUM BICARBONATE 650 MG PO TABS
1300.0000 mg | ORAL_TABLET | Freq: Two times a day (BID) | ORAL | Status: DC
  Administered 2018-12-17 – 2018-12-18 (×2): 1300 mg via ORAL
  Filled 2018-12-17 (×2): qty 2

## 2018-12-17 MED ORDER — PREGABALIN 75 MG PO CAPS
150.0000 mg | ORAL_CAPSULE | Freq: Every day | ORAL | Status: DC
  Administered 2018-12-17: 150 mg via ORAL
  Filled 2018-12-17: qty 2

## 2018-12-17 NOTE — Progress Notes (Signed)
Pharmacy Antibiotic Note  [name] is a 68 y.o. male admitted on 12/14/2018 with osteomyelitis.  Pharmacy has been consulted for Vancomycin dosing.  Vancomycin level 12. Last vancomycin dose 9/7  Plan: Vancomycin 1 g IV this morning F/U cultures  Height: 5\' 10"  (177.8 cm) Weight: 220 lb (99.8 kg) IBW/kg (Calculated) : 73  Temp (24hrs), Avg:97.8 F (36.6 C), Min:97.4 F (36.3 C), Max:98.4 F (36.9 C)  Recent Labs  Lab 12/14/18 1207 12/14/18 1645 12/14/18 1739 12/15/18 0241 12/16/18 0248 12/17/18 0250  WBC 13.0*  --  11.7* 10.0 7.1 5.1  CREATININE 5.14*  --  5.18*  --  7.06* 7.14*  LATICACIDVEN 1.7 1.1  --   --   --   --   VANCORANDOM  --   --   --   --   --  12    Estimated Creatinine Clearance: 11.6 mL/min (A) (by C-G formula based on SCr of 7.14 mg/dL (H)).    Allergies  Allergen Reactions  . Mushroom Extract Complex Nausea Only    Antimicrobials this admission: Vanc 9/7 >>  Phillis Knack, PharmD, BCPS  12/17/2018, 4:47 AM

## 2018-12-17 NOTE — Progress Notes (Signed)
Physical Therapy Treatment Patient Details Name: [name] MRN: [id number] DOB: 1950-08-01 Today's Date: 12/17/2018    History of Present Illness 68 year old male with significant PMH including diabeties, hypertension and chronic right foot wound, chronic kidney disease stage IV presented to the emergency room with 5 to 6 days of worsening erythema around his chronic right foot wound. s/p I&D of R foot and amputation of 4th and 5th toes on R foot, now NWB with wound vac.    PT Comments    Patient received in bed, reports he is waiting for lunch. Agrees to walk around to chair to sit up for lunch. Instructed patient that he is able to bear weight through heel on right. AFO donned on left. No assistance needed for supine to sit. Sit to stand required min assist from low bed. 2-3 attempts before he was able to achieve full standing. Ambulated with RW 15 feet to recliner. Patient will continue to benefit from skilled PT while here to improve mobility and functional independence.      Follow Up Recommendations  Home health PT;Supervision for mobility/OOB     Equipment Recommendations       Recommendations for Other Services       Precautions / Restrictions Precautions Precautions: Fall Other Brace: R post op shoe , AFO on left Restrictions Weight Bearing Restrictions: Yes RLE Weight Bearing: Non weight bearing Other Position/Activity Restrictions: Per Dr. [name] is allowed heel weight bearing on right with post op shoe    Mobility  Bed Mobility Overal bed mobility: Needs Assistance Bed Mobility: Supine to Sit     Supine to sit: Supervision     General bed mobility comments: no physical assistance needed with supine to sit.  Transfers Overall transfer level: Needs assistance Equipment used: Rolling walker (2 wheeled) Transfers: Sit to/from Stand Sit to Stand: Min assist         General transfer comment: sit<>stand from EOB with RW and min assist, cues for hand placement  and for R LE heel weight bearing precautions.  Ambulation/Gait Ambulation/Gait assistance: Min guard Gait Distance (Feet): 15 Feet Assistive device: Rolling walker (2 wheeled) Gait Pattern/deviations: Step-to pattern;Decreased stride length;Trunk flexed Gait velocity: decreased   General Gait Details: patient with improved ambulation now as he is allowed heel weight bearing on right. Also put AFO on left LE which he reported helped a lot.   Stairs             Wheelchair Mobility    Modified Rankin (Stroke Patients Only)       Balance Overall balance assessment: Needs assistance Sitting-balance support: Feet supported Sitting balance-Leahy Scale: Good     Standing balance support: Bilateral upper extremity supported Standing balance-Leahy Scale: Fair Standing balance comment: reliant on UE support using RW                            Cognition Arousal/Alertness: Awake/alert Behavior During Therapy: WFL for tasks assessed/performed Overall Cognitive Status: Within Functional Limits for tasks assessed                                        Exercises      General Comments        Pertinent Vitals/Pain Pain Assessment: No/denies pain    Home Living  Prior Function            PT Goals (current goals can now be found in the care plan section) Acute Rehab PT Goals Patient Stated Goal: "go home" PT Goal Formulation: With patient Time For Goal Achievement: 12/30/18 Potential to Achieve Goals: Good Progress towards PT goals: Progressing toward goals    Frequency    Min 3X/week      PT Plan Current plan remains appropriate    Co-evaluation              AM-PAC PT "6 Clicks" Mobility   Outcome Measure  Help needed turning from your back to your side while in a flat bed without using bedrails?: None Help needed moving from lying on your back to sitting on the side of a flat bed without  using bedrails?: A Little Help needed moving to and from a bed to a chair (including a wheelchair)?: A Little Help needed standing up from a chair using your arms (e.g., wheelchair or bedside chair)?: A Little Help needed to walk in hospital room?: A Little Help needed climbing 3-5 steps with a railing? : A Little 6 Click Score: 19    End of Session Equipment Utilized During Treatment: Gait belt Activity Tolerance: Patient tolerated treatment well Patient left: in chair;with call bell/phone within reach;with family/visitor present Nurse Communication: Mobility status PT Visit Diagnosis: Muscle weakness (generalized) (M62.81);Difficulty in walking, not elsewhere classified (R26.2)     Time: 8335-8251 PT Time Calculation (min) (ACUTE ONLY): 21 min  Charges:  $Gait Training: 8-22 mins                     Gwenda Heiner, PT, GCS 12/17/18,1:35 PM

## 2018-12-17 NOTE — Care Management Important Message (Signed)
Important Message  Patient Details  Name: [name] MRN: [id number] Date of Birth: [birth year]   Medicare Important Message Given:  Yes     Deegan Valentino 12/17/2018, 4:09 PM

## 2018-12-17 NOTE — Progress Notes (Signed)
Admit: 12/14/2018 LOS: 73  68 year old male with infected diabetic foot wound status post debridement and amputation of fourth and fifth toes of the right foot with acute on chronic renal failure.  Subjective:  . Stable creatinine, . Partially recorded U OP in excess of 0.5 L . Hemoglobin stable . Tolerating p.o., remains on LR  09/09 0701 - 09/10 0700 In: 2520.2 [P.O.:960; I.V.:1328.6; IV Piggyback:231.6] Out: 575 [Urine:525; Drains:50]  Filed Weights   12/14/18 1154 12/15/18 1536  Weight: 99.8 kg 99.8 kg    Scheduled Meds: . sodium chloride   Intravenous Once  . aspirin EC  81 mg Oral QHS  . carvedilol  25 mg Oral BID  . docusate sodium  100 mg Oral BID  . DULoxetine  60 mg Oral BID  . feeding supplement (PRO-STAT SUGAR FREE 64)  30 mL Oral TID BM  . fenofibrate  160 mg Oral QHS  . heparin  5,000 Units Subcutaneous Q8H  . insulin aspart  0-5 Units Subcutaneous QHS  . insulin aspart  0-9 Units Subcutaneous TID WC  . insulin glargine  24 Units Subcutaneous QHS  . lactobacillus acidophilus  2 tablet Oral Daily  . levothyroxine  112 mcg Oral Q0600  . metroNIDAZOLE  500 mg Oral Q8H  . multivitamin  1 tablet Oral QHS  . mupirocin ointment  1 application Nasal BID  . pregabalin  150 mg Oral QHS  . rosuvastatin  10 mg Oral Daily  . sodium chloride flush  3 mL Intravenous Once  . vancomycin variable dose per unstable renal function (pharmacist dosing)   Does not apply See admin instructions   Continuous Infusions: . sodium chloride 10 mL/hr at 12/15/18 1840  . ceFEPime (MAXIPIME) IV 1 g (12/17/18 1154)  . lactated ringers 75 mL/hr at 12/17/18 0616  . methocarbamol (ROBAXIN) IV     PRN Meds:.acetaminophen **OR** acetaminophen, bisacodyl, HYDROcodone-acetaminophen, HYDROmorphone (DILAUDID) injection, methocarbamol **OR** methocarbamol (ROBAXIN) IV, metoCLOPramide **OR** metoCLOPramide (REGLAN) injection, ondansetron **OR** ondansetron (ZOFRAN) IV, oxyCODONE, oxyCODONE,  polyethylene glycol  Current Labs: reviewed  Results for TREJUAN, MATHERNE (MRN [id number]) as of 12/17/2018 15:57  Ref. Range 12/17/2018 02:50  Saturation Ratios Latest Ref Range: 17.9 - 39.5 % 19  Ferritin Latest Ref Range: 24 - 336 ng/mL 2,007 (H)    Physical Exam:  Blood pressure 132/74, pulse 70, temperature 98.1 F (36.7 C), temperature source Oral, resp. rate 16, height 5\' 10"  (1.778 m), weight 99.8 kg, SpO2 96 %. GEN: NAD, sitting in chair, cordial ENT: NCAT EYES: EOMI CV: RRR, normal S1 and S2, no rub PULM: CTA B, normal work of breathing ABD: Soft, nontender SKIN: Wound VAC on right lateral distal foot EXT: No edema in the left leg No asterixis, CN II through XII grossly intact  A 1. CKD 5 with AKI, it appears he has progressive diabetic nephropathy and the acute insult surrounding the infection and amputation.    Follows with Dr. Hollie Salk at our office. No clear nephrotoxic exposures.    Stable in past 24 hours.  No uremia.  No indications for RRT at the current time. 2. Infected diabetic foot wound status post amputation of fourth and fifth toes on 9/8 with Dr. [name].  On Vanco/cefepime/Flagyl; wound VAC 3. Metabolic acidosis with anion gap of 13, likely driven by #1.    Start sodium bicarbonate 1300mg  twice daily  4. anemia, hemoglobin 10.4 at presentation, now 7.2; not surprising, asymptomatic, transfusion per primary.  Fe levels above.  If not improving will consider ESA  5. DM2, A1c 6.3% 6. HTN, BPs currently stable  P . As above, stop LR, begin sodium bicarbonate 1300 twice daily.  Continue to follow closely.  Consider ESA if hemoglobin remains low. . Medication Issues; o Preferred narcotic agents for pain control are hydromorphone, fentanyl, and methadone. Morphine should not be used.  o Baclofen should be avoided o Avoid oral sodium phosphate and magnesium citrate based laxatives / bowel preps    Pearson Grippe MD 12/17/2018, 3:55 PM  Recent Labs  Lab 12/14/18 1207  12/14/18 1739 12/16/18 0248 12/17/18 0250  NA 134*  --  136 134*  K 4.5  --  4.3 4.2  CL 103  --  109 107  CO2 16*  --  14* 16*  GLUCOSE 237*  --  144* 156*  BUN 58*  --  80* 90*  CREATININE 5.14* 5.18* 7.06* 7.14*  CALCIUM 8.5*  --  7.4* 7.5*   Recent Labs  Lab 12/14/18 1207  12/15/18 0241 12/16/18 0248 12/17/18 0250  WBC 13.0*   < > 10.0 7.1 5.1  NEUTROABS 11.4*  --   --  6.1 4.1  HGB 10.4*   < > 8.5* 7.2* 7.2*  HCT 33.1*   < > 26.6* 23.4* 22.7*  MCV 92.7   < > 89.3 92.1 91.5  PLT 197   < > 154 145* 159   < > = values in this interval not displayed.

## 2018-12-17 NOTE — Progress Notes (Signed)
Pt up to bathroom, refusing to use bedside commode, stated that PT had gotten him to the bathroom. Two person assist, used bedside commode over toliet and walker. Pt bearing weight on foot - has rigid shoe on. Will encourage pt to maintain NWB precautions.

## 2018-12-17 NOTE — Progress Notes (Signed)
PROGRESS NOTE    [name]  GXQ:[id number] DOB: 1951/03/28 DOA: 12/14/2018 PCP: Fanny Bien, MD    Brief Narrative:  68 year old diabetic, hypertension and chronic right foot wound, chronic kidney disease stage IV presented to the emergency room with 5 to 6 days of worsening erythema around his chronic right foot wound.  Draining purulent material.  Noticed fever for 3 days.  Patient found to have worsening diabetic foot infection with osteomyelitis and admitted to the hospital. 12/15/2018: Underwent surgical amputation.  Continues to worsen renal functions. Cultures with multiple organisms, final cultures pending.  Blood cultures negative so far.   Assessment & Plan:   Active Problems:   Diabetic foot ulcer (HCC)   HTN (hypertension)   Hypothyroidism   Diabetes mellitus ([place])   Osteomyelitis ([place])   AKI (acute kidney injury) ([place])   CKD (chronic kidney disease) stage 4, GFR 15-29 ml/min (HCC)   Cutaneous abscess of right foot  Diabetic foot ulcer with osteomyelitis: Continue broad-spectrum antibiotics.  Status post fourth and fifth metatarsal ray amputation.  Patient had evidence of significant local infection.  Wound cultures are growing multiple organisms.  Blood cultures negative so far. Continue antibiotics until final blood cultures and surgical cultures.  Postop wound care and wound VAC management as per surgery.  Adequate pain medications. Start mobilizing with therapies.  Acute kidney injury on chronic kidney stage IV: Patient has advanced kidney disease.  Adequate urine output.  Medications to dose with renal functions.  Renal functions continues to worsen. Strict intake and output monitoring.  Followed by nephrology.  Uncontrolled type 2 diabetes with peripheral neuropathy: On insulin.  Currently well controlled.  Latest A1c 6.3.  Continue similar doses of insulin.  Patient continue Lyrica and pain medications.  Hypertension: Blood pressures are adequate now.  Continue  beta-blockers.  Hypothyroidism: On Synthroid.  Continue.  Acute on chronic anemia of chronic disease: Hemoglobin further down to 7.2.  No evidence of active bleeding. We will continue close monitoring.   DVT prophylaxis: Heparin subcu Code Status: Full code Family Communication: None Disposition Plan: Continue inpatient hospitalization.  Ultimately may go home after he stabilizes on the renal functions.   Consultants:   Orthopedics  Nephrology  Procedures:  Right fourth fifth metatarsal ray amputation with wound VAC placement  Antimicrobials:  Cefepime, 12/14/2018>>> Vancomycin, 12/14/2018>>> Flagyl, 12/14/2018>>>   Subjective:  Patient seen and examined.  No overnight events.  Remains afebrile last 24 hours.  Minimal pain on the one side.  He is eager to walk. Urine output 1000 mL last 24 hours.  Objective: Vitals:   12/16/18 2024 12/17/18 0004 12/17/18 0438 12/17/18 0952  BP: 133/67 127/66 132/74   Pulse: 77 77 70   Resp: 16 16 16 16   Temp: 97.7 F (36.5 C) 98.4 F (36.9 C) 98.1 F (36.7 C)   TempSrc:  Oral Oral   SpO2: 99% 96% 96%   Weight:      Height:        Intake/Output Summary (Last 24 hours) at 12/17/2018 1336 Last data filed at 12/17/2018 1241 Gross per 24 hour  Intake 3177.65 ml  Output 1075 ml  Net 2102.65 ml   Filed Weights   12/14/18 1154 12/15/18 1536  Weight: 99.8 kg 99.8 kg    Examination:  General exam: Appears calm and comfortable, on room air. pale looking. Respiratory system: Clear to auscultation. Respiratory effort normal.  No added sounds. Cardiovascular system: S1 & S2 heard, RRR. No JVD, murmurs, rubs, gallops or clicks.  No pedal edema. Gastrointestinal system: Abdomen is nondistended, soft and nontender. No organomegaly or masses felt. Normal bowel sounds heard. Central nervous system: Alert and oriented. No focal neurological deficits. Extremities: Symmetric 5 x 5 power. Psychiatry: Judgement and insight appear normal. Mood &  affect appropriate.  Right lateral foot: Postop wound, treated with wound VAC dressing, not removed by me.    Data Reviewed: I have personally reviewed following labs and imaging studies  CBC: Recent Labs  Lab 12/14/18 1207 12/14/18 1739 12/15/18 0241 12/16/18 0248 12/17/18 0250  WBC 13.0* 11.7* 10.0 7.1 5.1  NEUTROABS 11.4*  --   --  6.1 4.1  HGB 10.4* 8.7* 8.5* 7.2* 7.2*  HCT 33.1* 26.2* 26.6* 23.4* 22.7*  MCV 92.7 88.2 89.3 92.1 91.5  PLT 197 175 154 145* 235   Basic Metabolic Panel: Recent Labs  Lab 12/14/18 1207 12/14/18 1739 12/16/18 0248 12/17/18 0250  NA 134*  --  136 134*  K 4.5  --  4.3 4.2  CL 103  --  109 107  CO2 16*  --  14* 16*  GLUCOSE 237*  --  144* 156*  BUN 58*  --  80* 90*  CREATININE 5.14* 5.18* 7.06* 7.14*  CALCIUM 8.5*  --  7.4* 7.5*   GFR: Estimated Creatinine Clearance: 11.6 mL/min (A) (by C-G formula based on SCr of 7.14 mg/dL (H)). Liver Function Tests: Recent Labs  Lab 12/14/18 1207  AST 27  ALT 25  ALKPHOS 46  BILITOT 0.6  PROT 6.7  ALBUMIN 3.0*   No results for input(s): LIPASE, AMYLASE in the last 168 hours. No results for input(s): AMMONIA in the last 168 hours. Coagulation Profile: Recent Labs  Lab 12/15/18 0241  INR 1.4*   Cardiac Enzymes: No results for input(s): CKTOTAL, CKMB, CKMBINDEX, TROPONINI in the last 168 hours. BNP (last 3 results) No results for input(s): PROBNP in the last 8760 hours. HbA1C: Recent Labs    12/14/18 1739  HGBA1C 6.3*   CBG: Recent Labs  Lab 12/16/18 1230 12/16/18 1640 12/16/18 2134 12/17/18 0815 12/17/18 1155  GLUCAP 207* 186* 196* 141* 179*   Lipid Profile: No results for input(s): CHOL, HDL, LDLCALC, TRIG, CHOLHDL, LDLDIRECT in the last 72 hours. Thyroid Function Tests: No results for input(s): TSH, T4TOTAL, FREET4, T3FREE, THYROIDAB in the last 72 hours. Anemia Panel: Recent Labs    12/17/18 0250  FERRITIN 2,007*  TIBC 158*  IRON 30*   Sepsis Labs: Recent Labs   Lab 12/14/18 1207 12/14/18 1645  LATICACIDVEN 1.7 1.1    Recent Results (from the past 240 hour(s))  Wound or Superficial Culture     Status: Abnormal   Collection Time: 12/14/18 12:46 PM   Specimen: Wound  Result Value Ref Range Status   Specimen Description WOUND  Final   Special Requests Immunocompromised  Final   Gram Stain   Final    RARE WBC PRESENT,BOTH PMN AND MONONUCLEAR FEW GRAM POSITIVE COCCI FEW GRAM POSITIVE RODS MODERATE GRAM NEGATIVE RODS    Culture (A)  Final    MULTIPLE ORGANISMS PRESENT, NONE PREDOMINANT NO STAPHYLOCOCCUS AUREUS ISOLATED NO GROUP A STREP (S.PYOGENES) ISOLATED Performed at [facility], [place] [address] [place], [place] [address]    Report Status 12/17/2018 FINAL  Final  SARS Coronavirus 2 [facility] order, Performed in [facility] hospital lab) Nasopharyngeal Nasopharyngeal Swab     Status: None   Collection Time: 12/14/18  1:59 PM   Specimen: Nasopharyngeal Swab  Result Value Ref Range  Status   SARS Coronavirus 2 NEGATIVE NEGATIVE Final    Comment: (NOTE) If result is NEGATIVE SARS-CoV-2 target nucleic acids are NOT DETECTED. The SARS-CoV-2 RNA is generally detectable in upper and lower  respiratory specimens during the acute phase of infection. The lowest  concentration of SARS-CoV-2 viral copies this assay can detect is 250  copies / mL. A negative result does not preclude SARS-CoV-2 infection  and should not be used as the sole basis for treatment or other  patient management decisions.  A negative result may occur with  improper specimen collection / handling, submission of specimen other  than nasopharyngeal swab, presence of viral mutation(s) within the  areas targeted by this assay, and inadequate number of viral copies  (<250 copies / mL). A negative result must be combined with clinical  observations, patient history, and epidemiological information. If result is POSITIVE SARS-CoV-2 target nucleic acids are DETECTED. The  SARS-CoV-2 RNA is generally detectable in upper and lower  respiratory specimens dur ing the acute phase of infection.  Positive  results are indicative of active infection with SARS-CoV-2.  Clinical  correlation with patient history and other diagnostic information is  necessary to determine patient infection status.  Positive results do  not rule out bacterial infection or co-infection with other viruses. If result is PRESUMPTIVE POSTIVE SARS-CoV-2 nucleic acids MAY BE PRESENT.   A presumptive positive result was obtained on the submitted specimen  and confirmed on repeat testing.  While 2019 novel coronavirus  (SARS-CoV-2) nucleic acids may be present in the submitted sample  additional confirmatory testing may be necessary for epidemiological  and / or clinical management purposes  to differentiate between  SARS-CoV-2 and other Sarbecovirus currently known to infect humans.  If clinically indicated additional testing with an alternate test  methodology [phone]) is advised. The SARS-CoV-2 RNA is generally  detectable in upper and lower respiratory sp ecimens during the acute  phase of infection. The expected result is Negative. Fact Sheet for Patients:  StrictlyIdeas.no Fact Sheet for Healthcare Providers: BankingDealers.co.za This test is not yet approved or cleared by the [place] FDA and has been authorized for detection and/or diagnosis of SARS-CoV-2 by FDA under an Emergency Use Authorization (EUA).  This EUA will remain in effect (meaning this test can be used) for the duration of the COVID-19 declaration under Section 564(b)(1) of the Act, 21 U.S.C. section 360bbb-3(b)(1), unless the authorization is terminated or revoked sooner. Performed at [facility], [place] [address], [place], [place] [address]   Blood culture (routine x 2)     Status: None (Preliminary result)   Collection Time: 12/14/18  2:35 PM   Specimen:  BLOOD  Result Value Ref Range Status   Specimen Description BLOOD LEFT ANTECUBITAL  Final   Special Requests AEROBIC BOTTLE ONLY Blood Culture adequate volume  Final   Culture   Final    NO GROWTH 2 DAYS Performed at [facility], [place] [address], [place], [place] [address]    Report Status PENDING  Incomplete  Surgical PCR screen     Status: None   Collection Time: 12/14/18  4:34 PM   Specimen: Nasal Mucosa; Nasal Swab  Result Value Ref Range Status   MRSA, PCR NEGATIVE NEGATIVE Final   Staphylococcus aureus NEGATIVE NEGATIVE Final    Comment: (NOTE) The Xpert SA Assay (FDA approved for NASAL specimens in patients 1 years of age and older), is one component of a comprehensive surveillance program. It is  not intended to diagnose infection nor to guide or monitor treatment. Performed at [facility], [place] [address], [place], [place] [address]   Blood culture (routine x 2)     Status: None (Preliminary result)   Collection Time: 12/14/18  4:45 PM   Specimen: BLOOD  Result Value Ref Range Status   Specimen Description BLOOD LEFT ANTECUBITAL  Final   Special Requests   Final    BOTTLES DRAWN AEROBIC ONLY Blood Culture adequate volume   Culture   Final    NO GROWTH 2 DAYS Performed at [facility], [place] [address], [place], [place] [address]    Report Status PENDING  Incomplete  Aerobic/Anaerobic Culture (surgical/deep wound)     Status: None (Preliminary result)   Collection Time: 12/15/18  5:02 PM   Specimen: Soft Tissue, Other  Result Value Ref Range Status   Specimen Description TISSUE RIGHT FOOT  Final   Special Requests NONE  Final   Gram Stain   Final    FEW WBC PRESENT,BOTH PMN AND MONONUCLEAR FEW GRAM POSITIVE COCCI FEW GRAM NEGATIVE RODS RARE GRAM POSITIVE RODS    Culture   Final    CULTURE REINCUBATED FOR BETTER GROWTH HOLDING FOR POSSIBLE ANAEROBE Performed at [facility], [place] [address], [place], [place] [address]    Report Status  PENDING  Incomplete         Radiology Studies: Vas [place] Abi With/wo Tbi  Result Date: 12/16/2018 LOWER EXTREMITY DOPPLER STUDY Indications: Ulceration. High Risk         Hypertension, hyperlipidemia, Diabetes, coronary artery Factors:          disease.  Comparison Study: no prior Performing Technologist: Abram Sander RVS  Examination Guidelines: A complete evaluation includes at minimum, Doppler waveform signals and systolic blood pressure reading at the level of bilateral brachial, anterior tibial, and posterior tibial arteries, when vessel segments are accessible. Bilateral testing is considered an integral part of a complete examination. Photoelectric Plethysmograph (PPG) waveforms and toe systolic pressure readings are included as required and additional duplex testing as needed. Limited examinations for reoccurring indications may be performed as noted.  ABI Findings: +--------+------------------+-----+---------+--------+ Right   Rt Pressure (mmHg)IndexWaveform Comment  +--------+------------------+-----+---------+--------+ [id number]                    triphasic         +--------+------------------+-----+---------+--------+ PTA     128               1.09 triphasic         +--------+------------------+-----+---------+--------+ DP      148               1.26 triphasic         +--------+------------------+-----+---------+--------+ +--------+------------------+-----+---------+-------+ Left    Lt Pressure (mmHg)IndexWaveform Comment +--------+------------------+-----+---------+-------+ [id number]                    triphasic        +--------+------------------+-----+---------+-------+ PTA     255               2.18 triphasic        +--------+------------------+-----+---------+-------+ DP      255               2.18 triphasic        +--------+------------------+-----+---------+-------+ +-------+-----------+-----------+------------+------------+  ABI/TBIToday's ABIToday's TBIPrevious ABIPrevious TBI +-------+-----------+-----------+------------+------------+ Right  1.26                                           +-------+-----------+-----------+------------+------------+  Left   2.18                                           +-------+-----------+-----------+------------+------------+  Summary: Right: Resting right ankle-brachial index is within normal range. No evidence of significant right lower extremity arterial disease. Left: Resting left ankle-brachial index indicates noncompressible left lower extremity arteries.  *See table(s) above for measurements and observations.  Electronically signed by Curt Jews MD on 12/16/2018 at 4:49:33 PM.   Final         Scheduled Meds: . sodium chloride   Intravenous Once  . aspirin EC  81 mg Oral QHS  . carvedilol  25 mg Oral BID  . docusate sodium  100 mg Oral BID  . DULoxetine  60 mg Oral BID  . feeding supplement (PRO-STAT SUGAR FREE 64)  30 mL Oral TID BM  . fenofibrate  160 mg Oral QHS  . heparin  5,000 Units Subcutaneous Q8H  . insulin aspart  0-5 Units Subcutaneous QHS  . insulin aspart  0-9 Units Subcutaneous TID WC  . insulin glargine  24 Units Subcutaneous QHS  . lactobacillus acidophilus  2 tablet Oral Daily  . levothyroxine  112 mcg Oral Q0600  . metroNIDAZOLE  500 mg Oral Q8H  . multivitamin  1 tablet Oral QHS  . mupirocin ointment  1 application Nasal BID  . pregabalin  150 mg Oral QHS  . rosuvastatin  10 mg Oral Daily  . sodium chloride flush  3 mL Intravenous Once  . vancomycin variable dose per unstable renal function (pharmacist dosing)   Does not apply See admin instructions   Continuous Infusions: . sodium chloride 10 mL/hr at 12/15/18 1840  . ceFEPime (MAXIPIME) IV 1 g (12/17/18 1154)  . lactated ringers 75 mL/hr at 12/17/18 0616  . methocarbamol (ROBAXIN) IV       LOS: 3 days    Time spent: 30 minutes    Barb Merino, MD [facility]  Pager [phone]  If 7PM-7AM, please contact night-coverage [email or web address] Password [email or web address] 12/17/2018, 1:36 PM

## 2018-12-18 LAB — BASIC METABOLIC PANEL
Anion gap: 11 (ref 5–15)
BUN: 98 mg/dL — ABNORMAL HIGH (ref 8–23)
CO2: 17 mmol/L — ABNORMAL LOW (ref 22–32)
Calcium: 7.8 mg/dL — ABNORMAL LOW (ref 8.9–10.3)
Chloride: 109 mmol/L (ref 98–111)
Creatinine, Ser: 6.99 mg/dL — ABNORMAL HIGH (ref 0.61–1.24)
GFR calc Af Amer: 8 mL/min — ABNORMAL LOW (ref >60)
GFR calc non Af Amer: 7 mL/min — ABNORMAL LOW (ref >60)
Glucose, Bld: 120 mg/dL — ABNORMAL HIGH (ref 70–99)
Potassium: 4.6 mmol/L (ref 3.5–5.1)
Sodium: 137 mmol/L (ref 135–145)

## 2018-12-18 LAB — CBC WITH DIFFERENTIAL/PLATELET
Abs Immature Granulocytes: 0.4 10*3/uL — ABNORMAL HIGH (ref 0.00–0.07)
Basophils Absolute: 0 10*3/uL (ref 0.0–0.1)
Basophils Relative: 0 %
Eosinophils Absolute: 0.1 10*3/uL (ref 0.0–0.5)
Eosinophils Relative: 3 %
HCT: 23.3 % — ABNORMAL LOW (ref 39.0–52.0)
Hemoglobin: 7.4 g/dL — ABNORMAL LOW (ref 13.0–17.0)
Immature Granulocytes: 8 %
Lymphocytes Relative: 9 %
Lymphs Abs: 0.5 10*3/uL — ABNORMAL LOW (ref 0.7–4.0)
MCH: 28.7 pg (ref 26.0–34.0)
MCHC: 31.8 g/dL (ref 30.0–36.0)
MCV: 90.3 fL (ref 80.0–100.0)
Monocytes Absolute: 0.4 10*3/uL (ref 0.1–1.0)
Monocytes Relative: 7 %
Neutro Abs: 3.7 10*3/uL (ref 1.7–7.7)
Neutrophils Relative %: 73 %
Platelets: 170 10*3/uL (ref 150–400)
RBC: 2.58 MIL/uL — ABNORMAL LOW (ref 4.22–5.81)
RDW: 15.7 % — ABNORMAL HIGH (ref 11.5–15.5)
WBC: 5.1 10*3/uL (ref 4.0–10.5)
nRBC: 0 % (ref 0.0–0.2)

## 2018-12-18 LAB — GLUCOSE, CAPILLARY
Glucose-Capillary: 94 mg/dL (ref 70–99)
Glucose-Capillary: 157 mg/dL — ABNORMAL HIGH (ref 70–99)

## 2018-12-18 LAB — AEROBIC/ANAEROBIC CULTURE W GRAM STAIN (SURGICAL/DEEP WOUND)

## 2018-12-18 MED ORDER — SODIUM BICARBONATE 650 MG PO TABS: 1300.0000 mg | ORAL_TABLET | Freq: Two times a day (BID) | ORAL | 0 refills | Status: AC

## 2018-12-18 MED ORDER — AMOXICILLIN-POT CLAVULANATE 500-125 MG PO TABS
1.0000 | ORAL_TABLET | Freq: Every day | ORAL | Status: DC
  Administered 2018-12-18: 500 mg via ORAL
  Filled 2018-12-18: qty 1

## 2018-12-18 MED ORDER — HYDROCODONE-ACETAMINOPHEN 5-325 MG PO TABS: 1.0000 | ORAL_TABLET | Freq: Four times a day (QID) | ORAL | Status: DC | PRN

## 2018-12-18 MED ORDER — AMOXICILLIN-POT CLAVULANATE 500-125 MG PO TABS: 1.0000 | ORAL_TABLET | Freq: Every day | ORAL | 0 refills | Status: AC

## 2018-12-18 NOTE — TOC Transition Note (Signed)
Transition of Care [facility]) - CM/SW Discharge Note   Patient Details  Name: [name] MRN: [id number] Date of Birth: [birth year]  Transition of Care [facility] CM/SW Contact:  Bartholomew Crews, RN Phone Number:  [phone] 12/18/2018, 4:25 PM   Clinical Narrative:    Spoke with patient and wife at the bedside. Discussed pending delivery of wheelchair from [place]. Patient and spouse requested 3n1. DME order created, and [employer] notified. Patient has pravena vac at bedside, and advised that bedside RN will attache when ready to transition home. [facility] to follow up with PT, RN - notified of transition home today. Wife to provide transportation home. No other transiton of care needs identified.    Final next level of care: [place] Barriers to Discharge: No Barriers Identified   Patient Goals and CMS Choice Patient states their goals for this hospitalization and ongoing recovery are:: take a shower CMS Medicare.gov Compare Post Acute Care list provided to:: Patient Choice offered to / list presented to : Patient, Spouse  Discharge Placement                       Discharge Plan and Services In-house Referral: NA Discharge Planning Services: CM Consult Post Acute Care Choice: Durable Medical Equipment, Home Health          DME Arranged: 3-N-1, Wheelchair manual DME Agency: [employer] Date DME Agency Contacted: 12/18/18 Time DME Agency Contacted: 1330 Representative spoke with at DME Agency: Thedore Mins [facility] Arranged: RN, PT [place] Agency: [place] Date [name]: 12/18/18 Time [place]: 1330 Representative spoke with at [place]: [place] ([place]) Interventions     Readmission Risk Interventions Readmission Risk Prevention Plan 12/16/2018  Transportation Screening Complete  PCP or Specialist Appt within 5-7 Days Complete  Home Care Screening Complete  Medication Review (RN CM) Complete  Some recent  data might be hidden

## 2018-12-18 NOTE — Progress Notes (Signed)
Wheelchair, BSC, and cushion delivered. Pt already had portable wound vac set up in room. I taught patient about unhooking and rehooking wound vac. I removed his IV and telemetry. I gave him the AVS ans discussed it with him and his wife. They understand his antibiotics and follow up schedule. Pt in stable condition for discharge.

## 2018-12-18 NOTE — Progress Notes (Signed)
[facility]    NEPHROLOGY PROGRESS NOTE  SUBJECTIVE: Feeling well today.  Is planning for discharge.  Denies headaches, fevers, chills, chest pain, shortness of breath, nausea, vomiting, diarrhea or dysuria.  All other review of systems are negative.     OBJECTIVE:  Vitals:   12/18/18 0820 12/18/18 1214  BP: (!) 155/80 (!) 148/90  Pulse: 70 72  Resp: 18 16  Temp: (!) 97.4 F (36.3 C) 97.6 F (36.4 C)  SpO2: 99% 100%    Intake/Output Summary (Last 24 hours) at 12/18/2018 1410 Last data filed at 12/18/2018 0630 Gross per 24 hour  Intake 1382.78 ml  Output 925 ml  Net 457.78 ml      General:  AAOx3 NAD HEENT: MMM  AT anicteric sclera Neck:  No JVD, no adenopathy CV:  Heart RRR  Lungs:  L/S CTA bilaterally Abd:  abd SNT/ND with normal BS GU:  Bladder non-palpable Extremities: +1 right lower extremity edema, wound VAC in place Skin:  No skin rash  MEDICATIONS:  . sodium chloride   Intravenous Once  . amoxicillin-clavulanate  1 tablet Oral Daily  . aspirin EC  81 mg Oral QHS  . carvedilol  25 mg Oral BID  . docusate sodium  100 mg Oral BID  . DULoxetine  60 mg Oral BID  . feeding supplement (PRO-STAT SUGAR FREE 64)  30 mL Oral TID BM  . fenofibrate  160 mg Oral QHS  . heparin  5,000 Units Subcutaneous Q8H  . insulin aspart  0-5 Units Subcutaneous QHS  . insulin aspart  0-9 Units Subcutaneous TID WC  . insulin glargine  24 Units Subcutaneous QHS  . lactobacillus acidophilus  2 tablet Oral Daily  . levothyroxine  112 mcg Oral Q0600  . multivitamin  1 tablet Oral QHS  . mupirocin ointment  1 application Nasal BID  . pregabalin  150 mg Oral QHS  . rosuvastatin  10 mg Oral Daily  . sodium bicarbonate  1,300 mg Oral BID  . sodium chloride flush  3 mL Intravenous Once       LABS:   CBC Latest Ref Rng & Units 12/18/2018 12/17/2018 12/16/2018  WBC 4.0 - 10.5 K/uL 5.1 5.1 7.1  Hemoglobin 13.0 - 17.0 g/dL 7.4(L) 7.2(L) 7.2(L)  Hematocrit 39.0 - 52.0  % 23.3(L) 22.7(L) 23.4(L)  Platelets 150 - 400 K/uL 170 159 145(L)    CMP Latest Ref Rng & Units 12/18/2018 12/17/2018 12/16/2018  Glucose 70 - 99 mg/dL 120(H) 156(H) 144(H)  BUN 8 - 23 mg/dL 98(H) 90(H) 80(H)  Creatinine 0.61 - 1.24 mg/dL 6.99(H) 7.14(H) 7.06(H)  Sodium 135 - 145 mmol/L 137 134(L) 136  Potassium 3.5 - 5.1 mmol/L 4.6 4.2 4.3  Chloride 98 - 111 mmol/L 109 107 109  CO2 22 - 32 mmol/L 17(L) 16(L) 14(L)  Calcium 8.9 - 10.3 mg/dL 7.8(L) 7.5(L) 7.4(L)  Total Protein 6.5 - 8.1 g/dL - - -  Total Bilirubin 0.3 - 1.2 mg/dL - - -  Alkaline Phos 38 - 126 U/L - - -  AST 15 - 41 U/L - - -  ALT 0 - 44 U/L - - -    Lab Results  Component Value Date   CALCIUM 7.8 (L) 12/18/2018       Component Value Date/Time   COLORURINE YELLOW 12/14/2018 1506   APPEARANCEUR HAZY (A) 12/14/2018 1506   LABSPEC 1.019 12/14/2018 1506   PHURINE 5.0 12/14/2018 1506   GLUCOSEU 50 (A) 12/14/2018 1506   HGBUR MODERATE (  A) 12/14/2018 1506   BILIRUBINUR NEGATIVE 12/14/2018 1506   KETONESUR NEGATIVE 12/14/2018 1506   PROTEINUR >=300 (A) 12/14/2018 1506   UROBILINOGEN 0.2 12/28/2014 1031   NITRITE NEGATIVE 12/14/2018 1506   LEUKOCYTESUR NEGATIVE 12/14/2018 1506   No results found for: PHART, PCO2ART, PO2ART, HCO3, TCO2, ACIDBASEDEF, O2SAT     Component Value Date/Time   IRON 30 (L) 12/17/2018 0250   TIBC 158 (L) 12/17/2018 0250   FERRITIN 2,007 (H) 12/17/2018 0250   IRONPCTSAT 19 12/17/2018 0250       ASSESSMENT/PLAN:    68 year old male with infected diabetic foot wound status post debridement and amputation of fourth and fifth toes of the right foot with acute on chronic renal failure.  1. CKD 5 with AKI, it appears he has progressive diabetic nephropathy and the acute insult surrounding the infection and amputation.   Follows with Dr. Hollie Salk at our office. No clear nephrotoxic exposures.   Stable in past 48 hours.No uremia. No indications for RRT at the current time.  We will follow-up in  office in 1 to 2 weeks. 2. Infected diabetic foot wound status post amputation of fourth and fifth toes on 9/8 with Dr. [name]. On Vanco/cefepime/Flagyl; wound VAC 3. Metabolic acidosis with anion gap of 13, likely driven by #1.   Started sodium bicarbonate 1300mg  twice daily yesterday. 4. anemia, hemoglobin 10.4 at presentation, now 7.2; not surprising, asymptomatic, transfusion per primary. Fe levels above.  If not improving will consider ESA 5. DM2, A1c 6.3% 6. HTN,BPs currently stable     [place], DO, FACP

## 2018-12-18 NOTE — Progress Notes (Addendum)
Patient suffers from impaired mobility, peripheral neuropathy which impairs their ability to perform daily activities like bathing in the home. A walker will not resolve issue with performing activities of daily living. A wheelchair will allow patient to safely perform daily activities. Patient can safely propel the wheelchair in the home or has a caregiver who can provide assistance. Length of need Lifetime.  Accessories: elevating leg rests (ELRs), wheel locks, extensions and anti-tippers.  Manya Silvas, RN CM Transitions of Care [phone]

## 2018-12-18 NOTE — Discharge Summary (Signed)
Physician Discharge Summary  [name] HLK:[id number] DOB: [birth year] DOA: 12/14/2018  PCP: Fanny Bien, MD  Admit date: 12/14/2018 Discharge date: 12/18/2018  Admitted From: home Discharge disposition: home with [facility]   Code Status: Full Code   Recommendations for Outpatient Follow-Up:   1. Home health care 2. Follow-up with Dr. [name] next week, 1 week postop. 3. Follow-up with nephrology as an outpatient.  Discharge Diagnosis:   Active Problems:   Diabetic foot ulcer (HCC)   HTN (hypertension)   Hypothyroidism   Diabetes mellitus ([place])   Osteomyelitis (HCC)   AKI (acute kidney injury) ([place])   CKD (chronic kidney disease) stage 4, GFR 15-29 ml/min (HCC)   Cutaneous abscess of right foot    History of Present Illness / Brief narrative:  Patient is 68 year old diabetic, hypertension and chronic right foot wound, chronic kidney disease stage IV who presented to the emergency room with 5 to 6 days of worsening erythema around his chronic right foot wound.  Draining purulent material.  Noticed fever for 3 days.  Patient found to have worsening diabetic foot infection with osteomyelitis and admitted to the hospital. 12/15/2018: Underwent surgical amputation.  Continues to worsen renal functions. Blood culture did not show any growth so far.  Wound culture showed multiple organisms but no staph aureus or strep pyogenes.     Subjective:  Seen and examined this morning.  Pleasant elderly Caucasian male.  Not in distress.  Wants to go home.  Has a wound VAC in the right foot.  Hospital Course:  Diabetic foot ulcer with osteomyelitis: 12/15/2018: Underwent surgical amputation by Dr. [name] on 9/8  Initially he was started on broad-spectrum antibiotic coverage with cefepime, vancomycin and Flagyl. Blood culture did not show any growth so far.  Wound culture showed multiple organisms but no staph aureus or strep pyogenes.   Dr. [name] is on vacation.  D/w orthoppedics PA Silvestre Gunner  this am.  Patient has a f/u plan with Dr. [name] on 9/15. We'll discharge the patient on Aumentin 500mg  daily (renally dosed) with probiotics.  Acute kidney injury on chronic kidney stage IV Metabolic acidosis Patient has advanced kidney disease. Creatinine 5.14 on 9/7, has persistently remained elevated throughout the hospitalization.  6.99 today.  Nephrology following.  Is not yet on dialysis.   Continue to follow-up with nephrology as an outpatient.  Started on sodium bicarbonate 1300 mg twice daily for metabolic acidosis.    Type 2 diabetes with peripheral neuropathy: On insulin.  Currently well controlled.  Latest A1c 6.3.  Continue similar doses of insulin.  Patient continue Lyrica and pain medications.  Hypertension: Blood pressures are adequate now.  Continue beta-blockers.  Hypothyroidism: On Synthroid.  Continue.  Acute on chronic anemia of chronic disease: Hemoglobin 10.4 on presentation, 7.2 on last check, likely because of intraoperative blood loss.  Not transfused on this hospitalization.  May need to consider ESA as an outpatient.   Stable for discharge to home today with a wound VAC.   Discharge Exam:   Vitals:   12/17/18 0952 12/17/18 2219 12/18/18 0552 12/18/18 0820  BP:  (!) 167/79 133/76 (!) 155/80  Pulse:  76 67 70  Resp: 16  17 18   Temp:  97.8 F (36.6 C) (!) 96.7 F (35.9 C) (!) 97.4 F (36.3 C)  TempSrc:   Axillary   SpO2:  100% 98% 99%  Weight:   105.9 kg   Height:        Body mass index is 33.5  kg/m.  General exam: Appears calm and comfortable.  Eager to go home Skin: No rashes, lesions or ulcers. HEENT: Atraumatic, normocephalic, supple neck, no obvious bleeding Lungs: Clear to auscultation bilaterally CVS: Regular rate and rhythm, no murmur GI/Abd soft, nontender, BS + CNS: Alert and awake, oriented x3 Psychiatry: Mood appropriate.  Intact judgment insight. Extremities: No pedal edema, no calf tenderness right foot pain postsurgical status.   On wound VAC  Discharge Instructions:  Wound care: Home health care, wound VAC  Discharge Instructions    Call MD for:  redness, tenderness, or signs of infection (pain, swelling, redness, odor or green/yellow discharge around incision site)   Complete by: As directed    Call MD for:  temperature >100.4   Complete by: As directed    Diet Carb Modified   Complete by: As directed    Increase activity slowly   Complete by: As directed    Negative Pressure Wound Therapy - Incisional   Complete by: As directed      Follow-up Information    [name], MD In 1 week.   Specialty: Orthopedic Surgery Contact information: [place] [address] [phone]          Allergies as of 12/18/2018      Reactions   Mushroom Extract Complex Nausea Only      Medication List    STOP taking these medications   doxycycline 100 MG tablet Commonly known as: VIBRA-TABS   predniSONE 10 MG tablet Commonly known as: DELTASONE     TAKE these medications   acetaminophen 500 MG tablet Commonly known as: TYLENOL Take 1,500 mg by mouth every 6 (six) hours as needed (pain).   allopurinol 100 MG tablet Commonly known as: ZYLOPRIM Take 200 mg by mouth every morning.   amLODipine 5 MG tablet Commonly known as: NORVASC Take 5 mg by mouth every morning.   amoxicillin-clavulanate 500-125 MG tablet Commonly known as: AUGMENTIN Take 1 tablet (500 mg total) by mouth daily for 25 days.   aspirin EC 81 MG tablet Take 81 mg by mouth every morning.   calcitRIOL 0.25 MCG capsule Commonly known as: ROCALTROL Take 0.25 mcg by mouth every morning.   carvedilol 25 MG tablet Commonly known as: COREG Take 25 mg by mouth 2 (two) times daily.   CAVERJECT IC 1 Dose by Intracavernosal route daily as needed (erectile dysfunction).   diclofenac sodium 1 % Gel Commonly known as: VOLTAREN Apply 1 application topically 3 (three) times daily as needed (pain).   DULoxetine  60 MG capsule Commonly known as: Cymbalta Take 1 capsule (60 mg total) by mouth 2 (two) times daily.   fenofibrate 160 MG tablet Take 160 mg by mouth at bedtime.   Insulin Degludec 200 UNIT/ML Sopn Commonly known as: [place] FlexTouch Inject 40 Units into the skin at bedtime. What changed: how much to take   levothyroxine 112 MCG tablet Commonly known as: SYNTHROID Take 112 mcg by mouth daily before breakfast. BRAND NAME SYNTHROID   Lyrica 150 MG capsule Generic drug: pregabalin Take 1 capsule (150 mg total) by mouth at bedtime. What changed: when to take this   OVER THE COUNTER MEDICATION Take 1 tablet by mouth 3 (three) times daily as needed (leg cramps). Hyland's Leg Cramps   Ozempic (1 MG/DOSE) 2 MG/1.5ML Sopn Generic drug: Semaglutide (1 MG/DOSE) Inject 1 mg into the skin every Friday.   PROBIOTIC PO Take 1 tablet by mouth every morning.   rosuvastatin 10  MG tablet Commonly known as: CRESTOR Take 10 mg by mouth at bedtime.   sodium bicarbonate 650 MG tablet Take 2 tablets (1,300 mg total) by mouth 2 (two) times daily.   Stool Softener 100 MG capsule Generic drug: docusate sodium Take 100 mg by mouth 2 (two) times daily.   triamcinolone 0.025 % cream Commonly known as: KENALOG Apply 1 application topically daily as needed (psoriasis).            Durable Medical Equipment  (From admission, onward)         Start     Ordered   12/18/18 1106  For home use only DME standard manual wheelchair with seat cushion  (Wheelchairs)  Once    Comments: Patient suffers from impaired mobility, peripheral neuropathy which impairs their ability to perform daily activities like bathing in the home.  A walker will not resolve issue with performing activities of daily living. A wheelchair will allow patient to safely perform daily activities. Patient can safely propel the wheelchair in the home or has a caregiver who can provide assistance. Length of need  Lifetime. Accessories: elevating leg rests (ELRs), wheel locks, extensions and anti-tippers.   12/18/18 1107          Time coordinating discharge: 35 minutes  The results of significant diagnostics from this hospitalization (including imaging, microbiology, ancillary and laboratory) are listed below for reference.    Procedures and Diagnostic Studies:   Dg Chest 2 View  Result Date: 12/14/2018 CLINICAL DATA:  Fever. EXAM: CHEST - 2 VIEW COMPARISON:  December 24, 2015 FINDINGS: Stable postsurgical changes. Cardiomediastinal silhouette is normal. Mediastinal contours appear intact. There is no evidence of focal airspace consolidation, pleural effusion or pneumothorax. Osseous structures are without acute abnormality. Soft tissues are grossly normal. IMPRESSION: No active cardiopulmonary disease. Electronically Signed   By: Fidela Salisbury M.D.   On: 12/14/2018 13:11   Mr Foot Right Wo Contrast  Result Date: 12/14/2018 CLINICAL DATA:  O: On the lateral aspect around the right fifth digit. EXAM: MRI OF THE RIGHT FOREFOOT WITHOUT CONTRAST TECHNIQUE: Multiplanar, multisequence MR imaging of the right forefoot was performed. No intravenous contrast was administered. COMPARISON:  None. FINDINGS: Bones/Joint/Cartilage There is increased T2 signal involving the fifth metatarsal head with subtle T1 hypointensity seen along the lateral margin best seen on series 4, image 8. There is also increased T2 signal seen involving the fifth metatarsal head diffusely and the fifth phalanges. There is also mildly increased STIR signal seen at the fourth proximal and distal phalanges. No areas cortical destruction or bony erosion are seen. No cysts seen at T1 hypointensity are noted. Ligaments The Lisfranc ligaments appear to be intact. Muscles and Tendons There is mild fatty atrophy noted within the muscles surrounding the forefoot. There is diffusely increased signal seen within the musculature surrounding the  forefoot however. There is fluid signal surrounding the fifth extensor digitorum tendon at the level of the metatarsal head best seen on series 5, image 8. The remainder of the flexor extensor tendons are intact. Soft tissues Overlying skin ulceration seen along the lateral aspect of the foot measuring 1.3 cm in length with diffuse subcutaneous edema. There is subcutaneous emphysema seen surrounding the dorsal aspect of the fifth digit and extending into the interdigital space. IMPRESSION: 1. Reactive marrow versus early osteomyelitis involving the lateral aspect of the fifth metatarsal head. No destructive changes however. Overlying area of skin ulceration and diffuse edema. No soft tissue abscess. 2. Reactive marrow involving the  fifth and fourth digits as described above. 3. Fifth extensor digitorum tenosynovitis 4. Subcutaneous emphysema on the dorsal aspect of the fifth digit and interdigital space. Electronically Signed   By: Prudencio Pair M.D.   On: 12/14/2018 20:02   Dg Foot Complete Right  Result Date: 12/14/2018 CLINICAL DATA:  Infection fifth toe EXAM: RIGHT FOOT COMPLETE - 3+ VIEW COMPARISON:  None. FINDINGS: Soft tissue swelling and extensive soft tissue air the level of the fifth toe and metatarsal head. Loss cortical distinctness of the medial aspect of the fifth metatarsal head is suspicious for acute osteomyelitis. No fracture identified. No dislocation. Mild degenerative changes within the midfoot. Prominent calcaneal enthesophyte. Vascular calcifications. IMPRESSION: 1. Findings suspicious for osteomyelitis of the fifth metatarsal head. 2. Soft tissue swelling with air within the soft tissues at the level of the fifth digit and fifth metatarsal head suggesting ulceration. No soft tissue gas is seen tracking proximally within the forefoot or hindfoot. Electronically Signed   By: Davina Poke M.D.   On: 12/14/2018 13:14     Labs:   Basic Metabolic Panel: Recent Labs  Lab 12/14/18 1207  12/14/18 1739 12/16/18 0248 12/17/18 0250 12/18/18 0243  NA 134*  --  136 134* 137  K 4.5  --  4.3 4.2 4.6  CL 103  --  109 107 109  CO2 16*  --  14* 16* 17*  GLUCOSE 237*  --  144* 156* 120*  BUN 58*  --  80* 90* 98*  CREATININE 5.14* 5.18* 7.06* 7.14* 6.99*  CALCIUM 8.5*  --  7.4* 7.5* 7.8*   GFR Estimated Creatinine Clearance: 12.2 mL/min (A) (by C-G formula based on SCr of 6.99 mg/dL (H)). Liver Function Tests: Recent Labs  Lab 12/14/18 1207  AST 27  ALT 25  ALKPHOS 46  BILITOT 0.6  PROT 6.7  ALBUMIN 3.0*   No results for input(s): LIPASE, AMYLASE in the last 168 hours. No results for input(s): AMMONIA in the last 168 hours. Coagulation profile Recent Labs  Lab 12/15/18 0241  INR 1.4*    CBC: Recent Labs  Lab 12/14/18 1207 12/14/18 1739 12/15/18 0241 12/16/18 0248 12/17/18 0250 12/18/18 0243  WBC 13.0* 11.7* 10.0 7.1 5.1 5.1  NEUTROABS 11.4*  --   --  6.1 4.1 3.7  HGB 10.4* 8.7* 8.5* 7.2* 7.2* 7.4*  HCT 33.1* 26.2* 26.6* 23.4* 22.7* 23.3*  MCV 92.7 88.2 89.3 92.1 91.5 90.3  PLT 197 175 154 145* 159 170   Cardiac Enzymes: No results for input(s): CKTOTAL, CKMB, CKMBINDEX, TROPONINI in the last 168 hours. BNP: Invalid input(s): POCBNP CBG: Recent Labs  Lab 12/17/18 0815 12/17/18 1155 12/17/18 1645 12/17/18 2233 12/18/18 0820  GLUCAP 141* 179* 157* 156* 94   D-Dimer No results for input(s): DDIMER in the last 72 hours. Hgb A1c No results for input(s): HGBA1C in the last 72 hours. Lipid Profile No results for input(s): CHOL, HDL, LDLCALC, TRIG, CHOLHDL, LDLDIRECT in the last 72 hours. Thyroid function studies No results for input(s): TSH, T4TOTAL, T3FREE, THYROIDAB in the last 72 hours.  Invalid input(s): FREET3 Anemia work up Recent Labs    12/17/18 0250  FERRITIN 2,007*  TIBC 158*  IRON 30*   Microbiology Recent Results (from the past 240 hour(s))  Wound or Superficial Culture     Status: Abnormal   Collection Time: 12/14/18  12:46 PM   Specimen: Wound  Result Value Ref Range Status   Specimen Description WOUND  Final   Special Requests Immunocompromised  Final   Gram Stain   Final    RARE WBC PRESENT,BOTH PMN AND MONONUCLEAR FEW GRAM POSITIVE COCCI FEW GRAM POSITIVE RODS MODERATE GRAM NEGATIVE RODS    Culture (A)  Final    MULTIPLE ORGANISMS PRESENT, NONE PREDOMINANT NO STAPHYLOCOCCUS AUREUS ISOLATED NO GROUP A STREP (S.PYOGENES) ISOLATED Performed at [facility], [place] [address], [place], [place] [address]    Report Status 12/17/2018 FINAL  Final  SARS Coronavirus 2 [facility] order, Performed in [facility] hospital lab) Nasopharyngeal Nasopharyngeal Swab     Status: None   Collection Time: 12/14/18  1:59 PM   Specimen: Nasopharyngeal Swab  Result Value Ref Range Status   SARS Coronavirus 2 NEGATIVE NEGATIVE Final    Comment: (NOTE) If result is NEGATIVE SARS-CoV-2 target nucleic acids are NOT DETECTED. The SARS-CoV-2 RNA is generally detectable in upper and lower  respiratory specimens during the acute phase of infection. The lowest  concentration of SARS-CoV-2 viral copies this assay can detect is 250  copies / mL. A negative result does not preclude SARS-CoV-2 infection  and should not be used as the sole basis for treatment or other  patient management decisions.  A negative result may occur with  improper specimen collection / handling, submission of specimen other  than nasopharyngeal swab, presence of viral mutation(s) within the  areas targeted by this assay, and inadequate number of viral copies  (<250 copies / mL). A negative result must be combined with clinical  observations, patient history, and epidemiological information. If result is POSITIVE SARS-CoV-2 target nucleic acids are DETECTED. The SARS-CoV-2 RNA is generally detectable in upper and lower  respiratory specimens dur ing the acute phase of infection.  Positive  results are indicative of active infection with  SARS-CoV-2.  Clinical  correlation with patient history and other diagnostic information is  necessary to determine patient infection status.  Positive results do  not rule out bacterial infection or co-infection with other viruses. If result is PRESUMPTIVE POSTIVE SARS-CoV-2 nucleic acids MAY BE PRESENT.   A presumptive positive result was obtained on the submitted specimen  and confirmed on repeat testing.  While 2019 novel coronavirus  (SARS-CoV-2) nucleic acids may be present in the submitted sample  additional confirmatory testing may be necessary for epidemiological  and / or clinical management purposes  to differentiate between  SARS-CoV-2 and other Sarbecovirus currently known to infect humans.  If clinically indicated additional testing with an alternate test  methodology [phone]) is advised. The SARS-CoV-2 RNA is generally  detectable in upper and lower respiratory sp ecimens during the acute  phase of infection. The expected result is Negative. Fact Sheet for Patients:  StrictlyIdeas.no Fact Sheet for Healthcare Providers: BankingDealers.co.za This test is not yet approved or cleared by the [place] FDA and has been authorized for detection and/or diagnosis of SARS-CoV-2 by FDA under an Emergency Use Authorization (EUA).  This EUA will remain in effect (meaning this test can be used) for the duration of the COVID-19 declaration under Section 564(b)(1) of the Act, 21 U.S.C. section 360bbb-3(b)(1), unless the authorization is terminated or revoked sooner. Performed at [facility], [place] [address], [place], [place] [address]   Blood culture (routine x 2)     Status: None (Preliminary result)   Collection Time: 12/14/18  2:35 PM   Specimen: BLOOD  Result Value Ref Range Status   Specimen Description BLOOD LEFT ANTECUBITAL  Final   Special Requests AEROBIC BOTTLE ONLY Blood Culture adequate  volume  Final   Culture    Final    NO GROWTH 2 DAYS Performed at [facility], [place] [address] [place], [place] [address]    Report Status PENDING  Incomplete  Surgical PCR screen     Status: None   Collection Time: 12/14/18  4:34 PM   Specimen: Nasal Mucosa; Nasal Swab  Result Value Ref Range Status   MRSA, PCR NEGATIVE NEGATIVE Final   Staphylococcus aureus NEGATIVE NEGATIVE Final    Comment: (NOTE) The Xpert SA Assay (FDA approved for NASAL specimens in patients 17 years of age and older), is one component of a comprehensive surveillance program. It is not intended to diagnose infection nor to guide or monitor treatment. Performed at [facility], [place] [address], [place], [place] [address]   Blood culture (routine x 2)     Status: None (Preliminary result)   Collection Time: 12/14/18  4:45 PM   Specimen: BLOOD  Result Value Ref Range Status   Specimen Description BLOOD LEFT ANTECUBITAL  Final   Special Requests   Final    BOTTLES DRAWN AEROBIC ONLY Blood Culture adequate volume   Culture   Final    NO GROWTH 2 DAYS Performed at [facility], [place] [name], [place], [place] [address]    Report Status PENDING  Incomplete  Aerobic/Anaerobic Culture (surgical/deep wound)     Status: None (Preliminary result)   Collection Time: 12/15/18  5:02 PM   Specimen: Soft Tissue, Other  Result Value Ref Range Status   Specimen Description TISSUE RIGHT FOOT  Final   Special Requests NONE  Final   Gram Stain   Final    FEW WBC PRESENT,BOTH PMN AND MONONUCLEAR FEW GRAM POSITIVE COCCI FEW GRAM NEGATIVE RODS RARE GRAM POSITIVE RODS    Culture   Final    CULTURE REINCUBATED FOR BETTER GROWTH HOLDING FOR POSSIBLE ANAEROBE Performed at [facility], [place] [address], [place], [place] [address]    Report Status PENDING  Incomplete    Signed: Marlowe Aschoff Misha Antonini  [facility] 12/18/2018, 11:07 AM

## 2018-12-19 LAB — CULTURE, BLOOD (ROUTINE X 2)
Culture: NO GROWTH
Culture: NO GROWTH
Special Requests: ADEQUATE
Special Requests: ADEQUATE

## 2018-12-21 ENCOUNTER — Telehealth: Payer: Self-pay | Admitting: Orthopedic Surgery

## 2018-12-21 ENCOUNTER — Telehealth: Payer: Self-pay

## 2018-12-21 NOTE — Telephone Encounter (Signed)
[name] with [name] would like a call back concerning [place] instructions and if patient will keep wound vac on until his F/U appointment on 12/28/2018.  Patient had right foot surgery on 12/15/2018.  CB# is [phone].  Please advise.  Thank you.

## 2018-12-21 NOTE — Telephone Encounter (Signed)
Patient called stated needed pain meds..just had surgery and just taking Tylenol. States been a liitle dizzy since he has been home.  Please call patient @ [phone]

## 2018-12-21 NOTE — Telephone Encounter (Signed)
Pt is s/p a right foot debridement on 12/15/18 he is requesting rx for pain medication. The only thing that the pt is taking is tylenol and lyrica 150 mg #190 filled on 11/26/18

## 2018-12-22 ENCOUNTER — Other Ambulatory Visit: Payer: Self-pay | Admitting: Orthopedic Surgery

## 2018-12-22 ENCOUNTER — Encounter: Payer: Self-pay | Admitting: Family

## 2018-12-22 ENCOUNTER — Ambulatory Visit (INDEPENDENT_AMBULATORY_CARE_PROVIDER_SITE_OTHER): Payer: Medicare Other | Admitting: Orthopedic Surgery

## 2018-12-22 VITALS — Ht 70.0 in | Wt 233.0 lb

## 2018-12-22 DIAGNOSIS — Z89431 Acquired absence of right foot: Secondary | ICD-10-CM

## 2018-12-22 MED ORDER — OXYCODONE-ACETAMINOPHEN 5-325 MG PO TABS: 1.0000 | ORAL_TABLET | ORAL | 0 refills | Status: DC | PRN

## 2018-12-22 MED ORDER — PENTOXIFYLLINE ER 400 MG PO TBCR: 400.0000 mg | EXTENDED_RELEASE_TABLET | Freq: Three times a day (TID) | ORAL | 3 refills | Status: DC

## 2018-12-22 MED ORDER — NITROGLYCERIN 0.2 MG/HR TD PT24: 0.2000 mg | MEDICATED_PATCH | Freq: Every day | TRANSDERMAL | 12 refills | Status: DC

## 2018-12-22 NOTE — Telephone Encounter (Signed)
rx sent

## 2018-12-22 NOTE — Telephone Encounter (Signed)
Pt has an appt today for vac malfunction. Will hold message and call with updated orders after visit.

## 2018-12-22 NOTE — Progress Notes (Signed)
rx sent

## 2018-12-23 ENCOUNTER — Encounter: Payer: Self-pay | Admitting: Orthopedic Surgery

## 2018-12-23 NOTE — Progress Notes (Signed)
Office Visit Note   Patient: [name]           Date of Birth: April 23, 1950           MRN: [id number] Visit Date: 12/22/2018              Requested by: Fanny Bien, [facility] [address],  [place] [address] PCP: Fanny Bien, MD  Chief Complaint  Patient presents with  . Right Foot - Routine Post Op    12/15/2018 right foot debridement amputation 4th and 5th toes      HPI: Patient is a 68 year old gentleman who presents 1 week status post right foot fourth and fifth ray amputations.  Patient complains of stinging states he needs pain medicine he states he has been nonweightbearing.  Assessment & Plan: Visit Diagnoses:  1. Partial nontraumatic amputation of foot, right ([place])     Plan: The wound VAC was removed recommended continue nonweightbearing okay to wash with soap and water at this time patient has some mild wound dehiscence problems he is sending the prescription for nitroglycerin and Trental and a prescription for Percocet.  Follow-Up Instructions: Return in about 1 week (around 12/29/2018).   Ortho Exam  Patient is alert, oriented, no adenopathy, well-dressed, normal affect, normal respiratory effort. Examination there is some ischemic changes over the dorsal flap of the incision.  He has a strong dorsalis pedis pulse.  There is no drainage no odor no depth to the incision no signs of infection  Imaging: No results found. No images are attached to the encounter.  Labs: Lab Results  Component Value Date   HGBA1C 6.3 (H) 12/14/2018   HGBA1C 6.0 (H) 12/24/2015   HGBA1C 7.0 (H) 12/25/2013   ESRSEDRATE 131 (H) 12/14/2018   ESRSEDRATE 16 02/06/2018   ESRSEDRATE 53 (H) 09/15/2012   CRP 32.1 (H) 12/14/2018   REPTSTATUS 12/18/2018 FINAL 12/15/2018   GRAMSTAIN  12/15/2018    FEW WBC PRESENT,BOTH PMN AND MONONUCLEAR FEW GRAM POSITIVE COCCI FEW GRAM NEGATIVE RODS RARE GRAM POSITIVE RODS    CULT  12/15/2018    FEW ESCHERICHIA COLI FEW  CORYNEBACTERIUM STRIATUM FEW PREVOTELLA MELANINOGENICA BETA LACTAMASE POSITIVE Performed at [facility], [place] [address] [name], [place] [address]    LABORGA ESCHERICHIA COLI 12/15/2018     Lab Results  Component Value Date   ALBUMIN 3.0 (L) 12/14/2018   ALBUMIN 3.7 12/24/2015   ALBUMIN 3.7 12/28/2014   PREALBUMIN 14.8 (L) 12/14/2018    No results found for: MG No results found for: VD25OH  Lab Results  Component Value Date   PREALBUMIN 14.8 (L) 12/14/2018   CBC EXTENDED Latest Ref Rng & Units 12/18/2018 12/17/2018 12/16/2018  WBC 4.0 - 10.5 K/uL 5.1 5.1 7.1  RBC 4.22 - 5.81 MIL/uL 2.58(L) 2.48(L) 2.54(L)  HGB 13.0 - 17.0 g/dL 7.4(L) 7.2(L) 7.2(L)  HCT 39.0 - 52.0 % 23.3(L) 22.7(L) 23.4(L)  PLT 150 - 400 K/uL 170 159 145(L)  NEUTROABS 1.7 - 7.7 K/uL 3.7 4.1 6.1  LYMPHSABS 0.7 - 4.0 K/uL 0.5(L) 0.4(L) 0.4(L)     Body mass index is 33.43 kg/m.  Orders:  No orders of the defined types were placed in this encounter.  Meds ordered this encounter  Medications  . nitroGLYCERIN (NITRODUR - DOSED IN MG/24 HR) 0.2 mg/hr patch    Sig: Place 1 patch (0.2 mg total) onto the skin daily.    Dispense:  30 patch    Refill:  12  .  pentoxifylline (TRENTAL) 400 MG CR tablet    Sig: Take 1 tablet (400 mg total) by mouth 3 (three) times daily with meals.    Dispense:  90 tablet    Refill:  3  . oxyCODONE-acetaminophen (PERCOCET/ROXICET) 5-325 MG tablet    Sig: Take 1 tablet by mouth every 4 (four) hours as needed for severe pain.    Dispense:  30 tablet    Refill:  0     Procedures: No procedures performed  Clinical Data: No additional findings.  ROS:  All other systems negative, except as noted in the HPI. Review of Systems  Objective: Vital Signs: Ht 5\' 10"  (1.778 m)   Wt 233 lb (105.7 kg)   BMI 33.43 kg/m   Specialty Comments:  No specialty comments available.  PMFS History: Patient Active Problem List   Diagnosis Date Noted  . Cutaneous abscess of  right foot   . Osteomyelitis ([place]) 12/14/2018  . AKI (acute kidney injury) ([place]) 12/14/2018  . CKD (chronic kidney disease) stage 4, GFR 15-29 ml/min (HCC) 12/14/2018  . Gait abnormality 08/24/2018  . Diabetic neuropathy ([place]) 02/06/2018  . Cervical myelopathy ([place]) 02/06/2018  . Onychomycosis 10/30/2017  . Other spondylosis with radiculopathy, cervical region 01/27/2017  . Midfoot ulcer, left, limited to breakdown of skin ([place]) 11/15/2016  . Lateral epicondylitis, left elbow 08/12/2016  . Cellulitis of fifth toe of right foot 07/29/2016  . Prostate cancer ([place]) 06/19/2016  . Non-pressure chronic ulcer of other part of right foot limited to breakdown of skin ([place]) 04/04/2016  . Cellulitis of leg, right 12/24/2015  . Cellulitis of right lower extremity 12/24/2015  . CKD (chronic kidney disease), stage III ([place]) 12/25/2013  . Foot infection 12/24/2013  . Foot ulcer, left ([place]) 12/24/2013  . Diabetic foot ulcer ([place]) 09/14/2012  . Gout 09/14/2012  . HTN (hypertension) 09/14/2012  . Hypothyroidism 09/14/2012  . CAD (coronary artery disease) 09/14/2012  . Diabetes mellitus ([place]) 09/14/2012  . Hypercholesteremia   . Neuropathy [facility])    Past Medical History:  Diagnosis Date  . Cervical myelopathy ([place]) 02/06/2018  . Coronary artery disease   . Diabetes mellitus without complication ([place])   . Diabetic neuropathy ([place]) 02/06/2018  . Gait abnormality 08/24/2018  . Hypercholesteremia   . Hypertension   . Neuropathy   . Prostate cancer [facility])     Family History  Problem Relation Age of Onset  . Diabetes Mellitus II Mother   . Diabetes Mellitus II Father   . CAD Father   . Cancer Father        prostate  . Diabetes Mellitus II Brother   . Diabetes Mellitus II Brother   . Cancer Brother     Past Surgical History:  Procedure Laterality Date  . AMPUTATION Left 12/25/2013   Procedure: AMPUTATION RAY LEFT 5TH RAY;  Surgeon: [name], MD;  [facility];  Service: Orthopedics;   Laterality: Left;  . AMPUTATION Right 12/15/2018   Procedure: AMPUTATION OF 4TH AND 5TH TOES RIGHT FOOT;  Surgeon: [name], MD;  Location: [place];  Service: Orthopedics;  Laterality: Right;  . APPLICATION OF WOUND VAC Right 12/15/2018   Procedure: APPLICATION OF WOUND VAC;  Surgeon: [name], MD;  Location: [place];  Service: Orthopedics;  Laterality: Right;  . BACK SURGERY    . CHOLECYSTECTOMY    . CORONARY ARTERY BYPASS GRAFT    . I&D EXTREMITY Right 12/15/2018   Procedure: DEBRIDEMENT RIGHT FOOT;  Surgeon: Meridee Score  V, MD;  Location: [place];  Service: Orthopedics;  Laterality: Right;  . NECK SURGERY     novemver 2019   Social History   Occupational History  . Not on file  Tobacco Use  . Smoking status: Former Smoker    Types: Cigars    Quit date: 12/24/1988    Years since quitting: 30.0  . Smokeless tobacco: Never Used  Substance and Sexual Activity  . Alcohol use: No  . Drug use: No  . Sexual activity: Yes    Partners: Female

## 2018-12-23 NOTE — Telephone Encounter (Signed)
Patient was seen on 12/22/2018 and was applied dry dressing with Ace wrap. Sutures were still intact until next appt.

## 2018-12-28 ENCOUNTER — Ambulatory Visit: Payer: Medicare Other | Admitting: Orthopedic Surgery

## 2018-12-31 ENCOUNTER — Encounter: Payer: Self-pay | Admitting: Orthopedic Surgery

## 2018-12-31 ENCOUNTER — Ambulatory Visit (INDEPENDENT_AMBULATORY_CARE_PROVIDER_SITE_OTHER): Payer: Medicare Other | Admitting: Orthopedic Surgery

## 2018-12-31 ENCOUNTER — Telehealth: Payer: Self-pay | Admitting: Orthopedic Surgery

## 2018-12-31 VITALS — Ht 70.0 in | Wt 233.0 lb

## 2018-12-31 DIAGNOSIS — L97421 Non-pressure chronic ulcer of left heel and midfoot limited to breakdown of skin: Secondary | ICD-10-CM

## 2018-12-31 DIAGNOSIS — Z89431 Acquired absence of right foot: Secondary | ICD-10-CM

## 2018-12-31 MED ORDER — SILVER SULFADIAZINE 1 % EX CREA: 1.0000 "application " | TOPICAL_CREAM | Freq: Every day | CUTANEOUS | 3 refills | Status: DC

## 2018-12-31 NOTE — Telephone Encounter (Signed)
Dwyane Dee from [facility] called. He is the PT working with patient. He would like to know if patient could start using a Cam Boot for walking. His call back number is [phone]

## 2019-01-01 NOTE — Telephone Encounter (Signed)
[name] was called and informed patient should not be weightbearing at this time and should not use cam boot for walking.

## 2019-01-05 ENCOUNTER — Encounter: Payer: Self-pay | Admitting: Orthopedic Surgery

## 2019-01-05 NOTE — Progress Notes (Signed)
Office Visit Note   Patient: [name]           Date of Birth: 1950/12/23           MRN: [id number] Visit Date: 12/31/2018              Requested by: Fanny Bien, [address] [place],  [place] [address] PCP: Fanny Bien, MD  Chief Complaint  Patient presents with  . Right Foot - Routine Post Op    12/15/2018 right foot debridement and amputation 4th and 5th toes.       HPI: Patient is a 68 year old gentleman who presents status post debridement right foot amputation fourth and fifth rays and left foot status post fifth ray amputation.  Assessment & Plan: Visit Diagnoses:  1. Partial nontraumatic amputation of foot, right ([name])   2. Midfoot ulcer, left, limited to breakdown of skin ([place])     Plan: Patient is given a note to stay out of work until these wounds have completely healed.  Continue with the nitroglycerin patch prescription provided for Silvadene.  Follow-Up Instructions: Return in about 1 week (around 01/07/2019).   Ortho Exam  Patient is alert, oriented, no adenopathy, well-dressed, normal affect, normal respiratory effort. Examination patient is a strong dorsalis pedis pulse he is using a nitroglycerin patch there is wound dehiscence consistent with microcirculatory disorder.  Patient does have some tenderness to palpation of the origin of the plantar fascia.  Left foot patient has a good dorsalis pedis pulse and well-healed fifth ray amputation.  Imaging: No results found. No images are attached to the encounter.  Labs: Lab Results  Component Value Date   HGBA1C 6.3 (H) 12/14/2018   HGBA1C 6.0 (H) 12/24/2015   HGBA1C 7.0 (H) 12/25/2013   ESRSEDRATE 131 (H) 12/14/2018   ESRSEDRATE 16 02/06/2018   ESRSEDRATE 53 (H) 09/15/2012   CRP 32.1 (H) 12/14/2018   REPTSTATUS 12/18/2018 FINAL 12/15/2018   GRAMSTAIN  12/15/2018    FEW WBC PRESENT,BOTH PMN AND MONONUCLEAR FEW GRAM POSITIVE COCCI FEW GRAM NEGATIVE RODS RARE GRAM POSITIVE RODS     CULT  12/15/2018    FEW ESCHERICHIA COLI FEW CORYNEBACTERIUM STRIATUM FEW PREVOTELLA MELANINOGENICA BETA LACTAMASE POSITIVE Performed at [facility], [place] [address], [place],  [address]    LABORGA ESCHERICHIA COLI 12/15/2018     Lab Results  Component Value Date   ALBUMIN 3.0 (L) 12/14/2018   ALBUMIN 3.7 12/24/2015   ALBUMIN 3.7 12/28/2014   PREALBUMIN 14.8 (L) 12/14/2018    No results found for: MG No results found for: VD25OH  Lab Results  Component Value Date   PREALBUMIN 14.8 (L) 12/14/2018   CBC EXTENDED Latest Ref Rng & Units 12/18/2018 12/17/2018 12/16/2018  WBC 4.0 - 10.5 K/uL 5.1 5.1 7.1  RBC 4.22 - 5.81 MIL/uL 2.58(L) 2.48(L) 2.54(L)  HGB 13.0 - 17.0 g/dL 7.4(L) 7.2(L) 7.2(L)  HCT 39.0 - 52.0 % 23.3(L) 22.7(L) 23.4(L)  PLT 150 - 400 K/uL 170 159 145(L)  NEUTROABS 1.7 - 7.7 K/uL 3.7 4.1 6.1  LYMPHSABS 0.7 - 4.0 K/uL 0.5(L) 0.4(L) 0.4(L)     Body mass index is 33.43 kg/m.  Orders:  No orders of the defined types were placed in this encounter.  Meds ordered this encounter  Medications  . silver sulfADIAZINE (SILVADENE) 1 % cream    Sig: Apply 1 application topically daily. Apply to affected area daily plus dry dressing    Dispense:  400 g  Refill:  3     Procedures: No procedures performed  Clinical Data: No additional findings.  ROS:  All other systems negative, except as noted in the HPI. Review of Systems  Objective: Vital Signs: Ht 5\' 10"  (1.778 m)   Wt 233 lb (105.7 kg)   BMI 33.43 kg/m   Specialty Comments:  No specialty comments available.  PMFS History: Patient Active Problem List   Diagnosis Date Noted  . Cutaneous abscess of right foot   . Osteomyelitis ([place]) 12/14/2018  . AKI (acute kidney injury) ([place]) 12/14/2018  . CKD (chronic kidney disease) stage 4, GFR 15-29 ml/min (HCC) 12/14/2018  . Gait abnormality 08/24/2018  . Diabetic neuropathy ([place]) 02/06/2018  . Cervical myelopathy ([place]) 02/06/2018  .  Onychomycosis 10/30/2017  . Other spondylosis with radiculopathy, cervical region 01/27/2017  . Midfoot ulcer, left, limited to breakdown of skin ([place]) 11/15/2016  . Lateral epicondylitis, left elbow 08/12/2016  . Cellulitis of fifth toe of right foot 07/29/2016  . Prostate cancer ([place]) 06/19/2016  . Non-pressure chronic ulcer of other part of right foot limited to breakdown of skin ([place]) 04/04/2016  . Cellulitis of leg, right 12/24/2015  . Cellulitis of right lower extremity 12/24/2015  . CKD (chronic kidney disease), stage III ([place]) 12/25/2013  . Foot infection 12/24/2013  . Foot ulcer, left ([place]) 12/24/2013  . Diabetic foot ulcer ([place]) 09/14/2012  . Gout 09/14/2012  . HTN (hypertension) 09/14/2012  . Hypothyroidism 09/14/2012  . CAD (coronary artery disease) 09/14/2012  . Diabetes mellitus ([place]) 09/14/2012  . Hypercholesteremia   . Neuropathy [facility])    Past Medical History:  Diagnosis Date  . Cervical myelopathy ([place]) 02/06/2018  . Coronary artery disease   . Diabetes mellitus without complication ([place])   . Diabetic neuropathy ([place]) 02/06/2018  . Gait abnormality 08/24/2018  . Hypercholesteremia   . Hypertension   . Neuropathy   . Prostate cancer [facility])     Family History  Problem Relation Age of Onset  . Diabetes Mellitus II Mother   . Diabetes Mellitus II Father   . CAD Father   . Cancer Father        prostate  . Diabetes Mellitus II Brother   . Diabetes Mellitus II Brother   . Cancer Brother     Past Surgical History:  Procedure Laterality Date  . AMPUTATION Left 12/25/2013   Procedure: AMPUTATION RAY LEFT 5TH RAY;  Surgeon: [name], MD;  [facility];  Service: Orthopedics;  Laterality: Left;  . AMPUTATION Right 12/15/2018   Procedure: AMPUTATION OF 4TH AND 5TH TOES RIGHT FOOT;  Surgeon: [name], MD;  Location: [place];  Service: Orthopedics;  Laterality: Right;  . APPLICATION OF WOUND VAC Right 12/15/2018   Procedure: APPLICATION OF WOUND VAC;  Surgeon:  [name], MD;  Location: [place];  Service: Orthopedics;  Laterality: Right;  . BACK SURGERY    . CHOLECYSTECTOMY    . CORONARY ARTERY BYPASS GRAFT    . I&D EXTREMITY Right 12/15/2018   Procedure: DEBRIDEMENT RIGHT FOOT;  Surgeon: [name], MD;  Location: [place];  Service: Orthopedics;  Laterality: Right;  . NECK SURGERY     novemver 2019   Social History   Occupational History  . Not on file  Tobacco Use  . Smoking status: Former Smoker    Types: Cigars    Quit date: 12/24/1988    Years since quitting: 30.0  . Smokeless tobacco: Never Used  Substance and Sexual  Activity  . Alcohol use: No  . Drug use: No  . Sexual activity: Yes    Partners: Female

## 2019-01-07 ENCOUNTER — Encounter: Payer: Self-pay | Admitting: Orthopedic Surgery

## 2019-01-07 ENCOUNTER — Ambulatory Visit (INDEPENDENT_AMBULATORY_CARE_PROVIDER_SITE_OTHER): Payer: Medicare Other | Admitting: Orthopedic Surgery

## 2019-01-07 VITALS — Ht 70.0 in | Wt 233.0 lb

## 2019-01-07 DIAGNOSIS — Z89431 Acquired absence of right foot: Secondary | ICD-10-CM

## 2019-01-07 MED ORDER — OXYCODONE-ACETAMINOPHEN 5-325 MG PO TABS: 1.0000 | ORAL_TABLET | ORAL | 0 refills | Status: DC | PRN

## 2019-01-12 ENCOUNTER — Encounter: Payer: Self-pay | Admitting: Orthopedic Surgery

## 2019-01-12 NOTE — Progress Notes (Signed)
Office Visit Note   Patient: [name]           Date of Birth: December 26, 1950           MRN: [id number] Visit Date: 01/07/2019              Requested by: Fanny Bien, [address],  [place] [address] PCP: Fanny Bien, MD  Chief Complaint  Patient presents with  . Right Foot - Routine Post Op    12/15/2018 right foot deb; 4th &5th amp      HPI: Patient is a 68 year old gentleman who is status post right foot debridement and fourth and fifth ray amputation is currently using a nitroglycerin patch daily and Silvadene dressing changes.  Patient states his pain is worse at night.   Assessment & Plan: Visit Diagnoses:  1. Partial nontraumatic amputation of foot, right ([place])     Plan: Patient will continue with dressing changes daily continue with protected weightbearing.  Follow-Up Instructions: Return in about 2 weeks (around 01/21/2019).   Ortho Exam  Patient is alert, oriented, no adenopathy, well-dressed, normal affect, normal respiratory effort. Examination the wound has some necrotic tissue around the edges this was debrided there is 75% granulation tissue after debridement.  The sutures are harvested Iodosorb dressing was applied.  Imaging: No results found. No images are attached to the encounter.  Labs: Lab Results  Component Value Date   HGBA1C 6.3 (H) 12/14/2018   HGBA1C 6.0 (H) 12/24/2015   HGBA1C 7.0 (H) 12/25/2013   ESRSEDRATE 131 (H) 12/14/2018   ESRSEDRATE 16 02/06/2018   ESRSEDRATE 53 (H) 09/15/2012   CRP 32.1 (H) 12/14/2018   REPTSTATUS 12/18/2018 FINAL 12/15/2018   GRAMSTAIN  12/15/2018    FEW WBC PRESENT,BOTH PMN AND MONONUCLEAR FEW GRAM POSITIVE COCCI FEW GRAM NEGATIVE RODS RARE GRAM POSITIVE RODS    CULT  12/15/2018    FEW ESCHERICHIA COLI FEW CORYNEBACTERIUM STRIATUM FEW PREVOTELLA MELANINOGENICA BETA LACTAMASE POSITIVE Performed at [facility], [place] [address] [place],  [address]    LABORGA  ESCHERICHIA COLI 12/15/2018     Lab Results  Component Value Date   ALBUMIN 3.0 (L) 12/14/2018   ALBUMIN 3.7 12/24/2015   ALBUMIN 3.7 12/28/2014   PREALBUMIN 14.8 (L) 12/14/2018    No results found for: MG No results found for: VD25OH  Lab Results  Component Value Date   PREALBUMIN 14.8 (L) 12/14/2018   CBC EXTENDED Latest Ref Rng & Units 12/18/2018 12/17/2018 12/16/2018  WBC 4.0 - 10.5 K/uL 5.1 5.1 7.1  RBC 4.22 - 5.81 MIL/uL 2.58(L) 2.48(L) 2.54(L)  HGB 13.0 - 17.0 g/dL 7.4(L) 7.2(L) 7.2(L)  HCT 39.0 - 52.0 % 23.3(L) 22.7(L) 23.4(L)  PLT 150 - 400 K/uL 170 159 145(L)  NEUTROABS 1.7 - 7.7 K/uL 3.7 4.1 6.1  LYMPHSABS 0.7 - 4.0 K/uL 0.5(L) 0.4(L) 0.4(L)     Body mass index is 33.43 kg/m.  Orders:  No orders of the defined types were placed in this encounter.  Meds ordered this encounter  Medications  . oxyCODONE-acetaminophen (PERCOCET/ROXICET) 5-325 MG tablet    Sig: Take 1 tablet by mouth every 4 (four) hours as needed for severe pain.    Dispense:  30 tablet    Refill:  0     Procedures: No procedures performed  Clinical Data: No additional findings.  ROS:  All other systems negative, except as noted in the HPI. Review of Systems  Objective: Vital Signs:  Ht 5\' 10"  (1.778 m)   Wt 233 lb (105.7 kg)   BMI 33.43 kg/m   Specialty Comments:  No specialty comments available.  PMFS History: Patient Active Problem List   Diagnosis Date Noted  . Cutaneous abscess of right foot   . Osteomyelitis ([place]) 12/14/2018  . AKI (acute kidney injury) ([place]) 12/14/2018  . CKD (chronic kidney disease) stage 4, GFR 15-29 ml/min (HCC) 12/14/2018  . Gait abnormality 08/24/2018  . Diabetic neuropathy ([place]) 02/06/2018  . Cervical myelopathy ([place]) 02/06/2018  . Onychomycosis 10/30/2017  . Other spondylosis with radiculopathy, cervical region 01/27/2017  . Midfoot ulcer, left, limited to breakdown of skin ([place]) 11/15/2016  . Lateral epicondylitis, left elbow 08/12/2016  .  Cellulitis of fifth toe of right foot 07/29/2016  . Prostate cancer ([place]) 06/19/2016  . Non-pressure chronic ulcer of other part of right foot limited to breakdown of skin ([place]) 04/04/2016  . Cellulitis of leg, right 12/24/2015  . Cellulitis of right lower extremity 12/24/2015  . CKD (chronic kidney disease), stage III 12/25/2013  . Foot infection 12/24/2013  . Foot ulcer, left ([place]) 12/24/2013  . Diabetic foot ulcer ([place]) 09/14/2012  . Gout 09/14/2012  . HTN (hypertension) 09/14/2012  . Hypothyroidism 09/14/2012  . CAD (coronary artery disease) 09/14/2012  . Diabetes mellitus ([place]) 09/14/2012  . Hypercholesteremia   . Neuropathy [facility])    Past Medical History:  Diagnosis Date  . Cervical myelopathy ([place]) 02/06/2018  . Coronary artery disease   . Diabetes mellitus without complication ([place])   . Diabetic neuropathy ([place]) 02/06/2018  . Gait abnormality 08/24/2018  . Hypercholesteremia   . Hypertension   . Neuropathy   . Prostate cancer [facility])     Family History  Problem Relation Age of Onset  . Diabetes Mellitus II Mother   . Diabetes Mellitus II Father   . CAD Father   . Cancer Father        prostate  . Diabetes Mellitus II Brother   . Diabetes Mellitus II Brother   . Cancer Brother     Past Surgical History:  Procedure Laterality Date  . AMPUTATION Left 12/25/2013   Procedure: AMPUTATION RAY LEFT 5TH RAY;  Surgeon: [name], MD;  [facility];  Service: Orthopedics;  Laterality: Left;  . AMPUTATION Right 12/15/2018   Procedure: AMPUTATION OF 4TH AND 5TH TOES RIGHT FOOT;  Surgeon: [name], MD;  Location: [place];  Service: Orthopedics;  Laterality: Right;  . APPLICATION OF WOUND VAC Right 12/15/2018   Procedure: APPLICATION OF WOUND VAC;  Surgeon: [name], MD;  Location: [place];  Service: Orthopedics;  Laterality: Right;  . BACK SURGERY    . CHOLECYSTECTOMY    . CORONARY ARTERY BYPASS GRAFT    . I&D EXTREMITY Right 12/15/2018   Procedure: DEBRIDEMENT RIGHT  FOOT;  Surgeon: [name], MD;  Location: [place];  Service: Orthopedics;  Laterality: Right;  . NECK SURGERY     novemver 2019   Social History   Occupational History  . Not on file  Tobacco Use  . Smoking status: Former Smoker    Types: Cigars    Quit date: 12/24/1988    Years since quitting: 30.0  . Smokeless tobacco: Never Used  Substance and Sexual Activity  . Alcohol use: No  . Drug use: No  . Sexual activity: Yes    Partners: Female

## 2019-01-15 ENCOUNTER — Other Ambulatory Visit: Payer: Self-pay | Admitting: Neurology

## 2019-01-21 ENCOUNTER — Ambulatory Visit: Payer: Medicare Other | Admitting: Orthopedic Surgery

## 2019-01-25 ENCOUNTER — Ambulatory Visit (INDEPENDENT_AMBULATORY_CARE_PROVIDER_SITE_OTHER): Payer: Medicare Other | Admitting: Orthopedic Surgery

## 2019-01-25 ENCOUNTER — Other Ambulatory Visit: Payer: Self-pay

## 2019-01-25 ENCOUNTER — Encounter: Payer: Self-pay | Admitting: Orthopedic Surgery

## 2019-01-25 VITALS — Ht 70.0 in | Wt 233.0 lb

## 2019-01-25 DIAGNOSIS — Z89431 Acquired absence of right foot: Secondary | ICD-10-CM

## 2019-01-28 ENCOUNTER — Other Ambulatory Visit: Payer: Managed Care, Other (non HMO)

## 2019-02-09 ENCOUNTER — Encounter: Payer: Self-pay | Admitting: Orthopedic Surgery

## 2019-02-09 NOTE — Progress Notes (Signed)
Office Visit Note   Patient: [name]           Date of Birth: Feb 24, 1951           MRN: [id number] Visit Date: 01/25/2019              Requested by: Fanny Bien, [address] [place],  [place] [address] PCP: Fanny Bien, MD  Chief Complaint  Patient presents with  . Right Foot - Routine Post Op    12/15/2018 right foot deb 4th & 5th amp      HPI: Patient is a 68 year old gentleman who is 4 weeks status post right foot fourth and fifth ray amputations.  Patient states that starting last week he has been having some dizziness his CBG has been running in the 100s.  He states he has some shaking when he stands up possibly orthostatic hypotension.  Patient states he has been using the nitroglycerin patch.  Assessment & Plan: Visit Diagnoses:  1. Partial nontraumatic amputation of foot, right ([place])     Plan: Plan: Patient may start light duty work.  He is given a prescription for biotech for orthotics and a spacer and a carbon plate.  Recommended following up with his primary care physician for orthostatic hypotension recommended fluids.  Follow-Up Instructions: Return in about 4 weeks (around 02/22/2019).   Ortho Exam  Patient is alert, oriented, no adenopathy, well-dressed, normal affect, normal respiratory effort. Examination there is hyper granulation tissue along the surgical incision this was touched with silver nitrate there is no redness no cellulitis no drainage no signs of infection.  Imaging: No results found. No images are attached to the encounter.  Labs: Lab Results  Component Value Date   HGBA1C 6.3 (H) 12/14/2018   HGBA1C 6.0 (H) 12/24/2015   HGBA1C 7.0 (H) 12/25/2013   ESRSEDRATE 131 (H) 12/14/2018   ESRSEDRATE 16 02/06/2018   ESRSEDRATE 53 (H) 09/15/2012   CRP 32.1 (H) 12/14/2018   REPTSTATUS 12/18/2018 FINAL 12/15/2018   GRAMSTAIN  12/15/2018    FEW WBC PRESENT,BOTH PMN AND MONONUCLEAR FEW GRAM POSITIVE COCCI FEW GRAM NEGATIVE  RODS RARE GRAM POSITIVE RODS    CULT  12/15/2018    FEW ESCHERICHIA COLI FEW CORYNEBACTERIUM STRIATUM FEW PREVOTELLA MELANINOGENICA BETA LACTAMASE POSITIVE Performed at [facility], [place] [address] [place], [place] [address]    LABORGA ESCHERICHIA COLI 12/15/2018     Lab Results  Component Value Date   ALBUMIN 3.0 (L) 12/14/2018   ALBUMIN 3.7 12/24/2015   ALBUMIN 3.7 12/28/2014   PREALBUMIN 14.8 (L) 12/14/2018    No results found for: MG No results found for: VD25OH  Lab Results  Component Value Date   PREALBUMIN 14.8 (L) 12/14/2018   CBC EXTENDED Latest Ref Rng & Units 12/18/2018 12/17/2018 12/16/2018  WBC 4.0 - 10.5 K/uL 5.1 5.1 7.1  RBC 4.22 - 5.81 MIL/uL 2.58(L) 2.48(L) 2.54(L)  HGB 13.0 - 17.0 g/dL 7.4(L) 7.2(L) 7.2(L)  HCT 39.0 - 52.0 % 23.3(L) 22.7(L) 23.4(L)  PLT 150 - 400 K/uL 170 159 145(L)  NEUTROABS 1.7 - 7.7 K/uL 3.7 4.1 6.1  LYMPHSABS 0.7 - 4.0 K/uL 0.5(L) 0.4(L) 0.4(L)     Body mass index is 33.43 kg/m.  Orders:  No orders of the defined types were placed in this encounter.  No orders of the defined types were placed in this encounter.    Procedures: No procedures performed  Clinical Data: No additional findings.  ROS:  All other systems negative, except as noted in the HPI. Review of Systems  Objective: Vital Signs: Ht 5\' 10"  (1.778 m)   Wt 233 lb (105.7 kg)   BMI 33.43 kg/m   Specialty Comments:  No specialty comments available.  PMFS History: Patient Active Problem List   Diagnosis Date Noted  . Cutaneous abscess of right foot   . Osteomyelitis ([place]) 12/14/2018  . AKI (acute kidney injury) ([place]) 12/14/2018  . CKD (chronic kidney disease) stage 4, GFR 15-29 ml/min ([facility]) 12/14/2018  . Gait abnormality 08/24/2018  . Diabetic neuropathy ([place]) 02/06/2018  . Cervical myelopathy ([place]) 02/06/2018  . Onychomycosis 10/30/2017  . Other spondylosis with radiculopathy, cervical region 01/27/2017  . Midfoot ulcer, left, limited  to breakdown of skin ([place]) 11/15/2016  . Lateral epicondylitis, left elbow 08/12/2016  . Cellulitis of fifth toe of right foot 07/29/2016  . Prostate cancer ([place]) 06/19/2016  . Non-pressure chronic ulcer of other part of right foot limited to breakdown of skin ([place]) 04/04/2016  . Cellulitis of leg, right 12/24/2015  . Cellulitis of right lower extremity 12/24/2015  . CKD (chronic kidney disease), stage III 12/25/2013  . Foot infection 12/24/2013  . Foot ulcer, left ([place]) 12/24/2013  . Diabetic foot ulcer ([place]) 09/14/2012  . Gout 09/14/2012  . HTN (hypertension) 09/14/2012  . Hypothyroidism 09/14/2012  . CAD (coronary artery disease) 09/14/2012  . Diabetes mellitus ([place]) 09/14/2012  . Hypercholesteremia   . Neuropathy [facility])    Past Medical History:  Diagnosis Date  . Cervical myelopathy ([place]) 02/06/2018  . Coronary artery disease   . Diabetes mellitus without complication ([place])   . Diabetic neuropathy ([name]) 02/06/2018  . Gait abnormality 08/24/2018  . Hypercholesteremia   . Hypertension   . Neuropathy   . Prostate cancer [facility])     Family History  Problem Relation Age of Onset  . Diabetes Mellitus II Mother   . Diabetes Mellitus II Father   . CAD Father   . Cancer Father        prostate  . Diabetes Mellitus II Brother   . Diabetes Mellitus II Brother   . Cancer Brother     Past Surgical History:  Procedure Laterality Date  . AMPUTATION Left 12/25/2013   Procedure: AMPUTATION RAY LEFT 5TH RAY;  Surgeon: [name], MD;  Location: [facility];  Service: Orthopedics;  Laterality: Left;  . AMPUTATION Right 12/15/2018   Procedure: AMPUTATION OF 4TH AND 5TH TOES RIGHT FOOT;  Surgeon: [name], MD;  Location: [place];  Service: Orthopedics;  Laterality: Right;  . APPLICATION OF WOUND VAC Right 12/15/2018   Procedure: APPLICATION OF WOUND VAC;  Surgeon: [name], MD;  Location: [name];  Service: Orthopedics;  Laterality: Right;  . BACK SURGERY    . CHOLECYSTECTOMY    .  CORONARY ARTERY BYPASS GRAFT    . I&D EXTREMITY Right 12/15/2018   Procedure: DEBRIDEMENT RIGHT FOOT;  Surgeon: [name], MD;  Location: [facility];  Service: Orthopedics;  Laterality: Right;  . NECK SURGERY     novemver 2019   Social History   Occupational History  . Not on file  Tobacco Use  . Smoking status: Former Smoker    Types: Cigars    Quit date: 12/24/1988    Years since quitting: 30.1  . Smokeless tobacco: Never Used  Substance and Sexual Activity  . Alcohol use: No  . Drug use: No  . Sexual activity: Yes    Partners: Female

## 2019-02-18 ENCOUNTER — Other Ambulatory Visit: Payer: Self-pay | Admitting: Neurology

## 2019-02-18 NOTE — Telephone Encounter (Signed)
I called pt about making an appt with Judson Roch NP per Dr Jannifer Franklin recommendation. I stated a refill is being requested on Cymbalta. Pt stated he recently had foot surgery and Dr. [name] decrease his cymbalta to one tablet daily. He stated Dr.Duda was suppose to notify Dr.WIllis. I stated a message will be sent to Dr. Jannifer Franklin for review on the medication. Pt given appt with Judson Roch NP.

## 2019-02-25 ENCOUNTER — Ambulatory Visit (INDEPENDENT_AMBULATORY_CARE_PROVIDER_SITE_OTHER): Payer: Medicare Other | Admitting: Orthopedic Surgery

## 2019-02-25 ENCOUNTER — Other Ambulatory Visit: Payer: Self-pay

## 2019-02-25 ENCOUNTER — Encounter: Payer: Self-pay | Admitting: Orthopedic Surgery

## 2019-02-25 VITALS — Ht 70.0 in | Wt 233.0 lb

## 2019-02-25 DIAGNOSIS — Z89431 Acquired absence of right foot: Secondary | ICD-10-CM

## 2019-03-12 ENCOUNTER — Other Ambulatory Visit: Payer: Self-pay

## 2019-03-12 DIAGNOSIS — N183 Chronic kidney disease, stage 3 unspecified: Secondary | ICD-10-CM

## 2019-03-15 ENCOUNTER — Other Ambulatory Visit: Payer: Self-pay

## 2019-03-15 ENCOUNTER — Ambulatory Visit (HOSPITAL_COMMUNITY)
Admission: RE | Admit: 2019-03-15 | Discharge: 2019-03-15 | Disposition: A | Discharge status: Home / Self Care | Payer: Medicare Other | Source: Ambulatory Visit | Attending: Family | Admitting: Family

## 2019-03-15 ENCOUNTER — Encounter: Payer: Self-pay | Admitting: Family

## 2019-03-15 ENCOUNTER — Ambulatory Visit (INDEPENDENT_AMBULATORY_CARE_PROVIDER_SITE_OTHER): Payer: Medicare Other | Admitting: Surgery

## 2019-03-15 ENCOUNTER — Ambulatory Visit (INDEPENDENT_AMBULATORY_CARE_PROVIDER_SITE_OTHER)
Admission: RE | Admit: 2019-03-15 | Discharge: 2019-03-15 | Disposition: A | Discharge status: Home / Self Care | Payer: Medicare Other | Source: Ambulatory Visit | Attending: Family | Admitting: Family

## 2019-03-15 VITALS — BP 159/81 | HR 72 | Temp 96.7°F | Resp 16 | Ht 70.0 in | Wt 212.0 lb

## 2019-03-15 DIAGNOSIS — N183 Chronic kidney disease, stage 3 unspecified: Secondary | ICD-10-CM | POA: Diagnosis present

## 2019-03-15 DIAGNOSIS — N185 Chronic kidney disease, stage 5: Secondary | ICD-10-CM | POA: Diagnosis not present

## 2019-03-15 DIAGNOSIS — N186 End stage renal disease: Secondary | ICD-10-CM | POA: Diagnosis not present

## 2019-03-15 DIAGNOSIS — I2510 Atherosclerotic heart disease of native coronary artery without angina pectoris: Secondary | ICD-10-CM

## 2019-03-15 NOTE — Patient Instructions (Addendum)
T

## 2019-03-15 NOTE — H&P (View-Only) (Signed)
[facility]  Patient name: [name] MRN: [id number] DOB: 1951/02/23 Sex: male   REQUESTING PROVIDER:   Dr. Hollie Salk   REASON FOR CONSULT:    Dialysis access  HISTORY OF PRESENT ILLNESS:   [name] is a 68 y.o. male, who is referred for evaluation of dialysis access.  He is not yet started dialysis.  He is right-handed.  His renal failure secondary to diabetes and hypertension.  Patient has a longstanding history of diabetes which has been complicated by neuropathy and nephropathy.  He has a history of a left fifth toe amputation.  He also suffers from coronary artery disease.  He is a former smoker.  PAST MEDICAL HISTORY    Past Medical History:  Diagnosis Date  . Cervical myelopathy ([place]) 02/06/2018  . Coronary artery disease   . Diabetes mellitus without complication ([place])   . Diabetic neuropathy ([place]) 02/06/2018  . Gait abnormality 08/24/2018  . Hypercholesteremia   . Hypertension   . Neuropathy   . Prostate cancer ([place])      FAMILY HISTORY   Family History  Problem Relation Age of Onset  . Diabetes Mellitus II Mother   . Diabetes Mellitus II Father   . CAD Father   . Cancer Father        prostate  . Diabetes Mellitus II Brother   . Diabetes Mellitus II Brother   . Cancer Brother     SOCIAL HISTORY:   Social History   Socioeconomic History  . Marital status: Married    Spouse name: Not on file  . Number of children: Not on file  . Years of education: Not on file  . Highest education level: Not on file  Occupational History  . Not on file  Social Needs  . Financial resource strain: Not on file  . Food insecurity    Worry: Not on file    Inability: Not on file  . Transportation needs    Medical: Not on file    Non-medical: Not on file  Tobacco Use  . Smoking status: Former Smoker    Types: Cigars    Quit date: 12/24/1988    Years since quitting: 30.2  . Smokeless tobacco: Never  Used  Substance and Sexual Activity  . Alcohol use: No  . Drug use: No  . Sexual activity: Yes    Partners: Female  Lifestyle  . Physical activity    Days per week: Not on file    Minutes per session: Not on file  . Stress: Not on file  Relationships  . Social Herbalist on phone: Not on file    Gets together: Not on file    Attends religious service: Not on file    Active member of club or organization: Not on file    Attends meetings of clubs or organizations: Not on file    Relationship status: Not on file  . Intimate partner violence    Fear of current or ex partner: Not on file    Emotionally abused: Not on file    Physically abused: Not on file    Forced sexual activity: Not on file  Other Topics Concern  . Not on file  Social History Narrative   Regular exercise: yes 3 times a week   Caffeine use: hot tea    ALLERGIES:    Allergies  Allergen Reactions  . Mushroom Extract Complex Nausea Only    CURRENT MEDICATIONS:  Current Outpatient Medications  Medication Sig Dispense Refill  . acetaminophen (TYLENOL) 500 MG tablet Take 1,500 mg by mouth every 6 (six) hours as needed (pain).    Marland Kitchen allopurinol (ZYLOPRIM) 100 MG tablet Take 200 mg by mouth every morning.     . Alprostadil, Vasodilator, (CAVERJECT IC) 1 Dose by Intracavernosal route daily as needed (erectile dysfunction).    Marland Kitchen aspirin EC 81 MG tablet Take 81 mg by mouth every morning.     . calcitRIOL (ROCALTROL) 0.25 MCG capsule Take 0.25 mcg by mouth every morning.     . carvedilol (COREG) 25 MG tablet Take 25 mg by mouth 2 (two) times daily.     . diclofenac sodium (VOLTAREN) 1 % GEL Apply 1 application topically 3 (three) times daily as needed (pain).     Marland Kitchen docusate sodium (STOOL SOFTENER) 100 MG capsule Take 100 mg by mouth 2 (two) times daily.    . DULoxetine (CYMBALTA) 60 MG capsule Take 1 capsule (60 mg total) by mouth daily. 90 capsule 1  . fenofibrate 160 MG tablet Take 160 mg by mouth  at bedtime.    . Insulin Degludec (TRESIBA FLEXTOUCH) 200 UNIT/ML SOPN Inject 40 Units into the skin at bedtime. (Patient taking differently: Inject 24 Units into the skin at bedtime. )    . levothyroxine (SYNTHROID, LEVOTHROID) 112 MCG tablet Take 112 mcg by mouth daily before breakfast. BRAND NAME SYNTHROID    . LYRICA 150 MG capsule Take 1 capsule (150 mg total) by mouth at bedtime. (Patient taking differently: Take 150 mg by mouth 2 (two) times daily. )    . nitroGLYCERIN (NITRODUR - DOSED IN MG/24 HR) 0.2 mg/hr patch Place 1 patch (0.2 mg total) onto the skin daily. 30 patch 12  . OVER THE COUNTER MEDICATION Take 1 tablet by mouth 3 (three) times daily as needed (leg cramps). Hyland's Leg Cramps    . pentoxifylline (TRENTAL) 400 MG CR tablet Take 1 tablet (400 mg total) by mouth 3 (three) times daily with meals. 90 tablet 3  . rosuvastatin (CRESTOR) 10 MG tablet Take 10 mg by mouth at bedtime.     . Semaglutide, 1 MG/DOSE, (OZEMPIC, 1 MG/DOSE,) 2 MG/1.5ML SOPN Inject 1 mg into the skin every Friday.    . triamcinolone (KENALOG) 0.025 % cream Apply 1 application topically daily as needed (psoriasis).    Marland Kitchen amLODipine (NORVASC) 5 MG tablet Take 5 mg by mouth every morning.     Marland Kitchen oxyCODONE-acetaminophen (PERCOCET/ROXICET) 5-325 MG tablet Take 1 tablet by mouth every 4 (four) hours as needed for severe pain. (Patient not taking: Reported on 03/15/2019) 30 tablet 0  . Probiotic Product (PROBIOTIC PO) Take 1 tablet by mouth every morning.    . silver sulfADIAZINE (SILVADENE) 1 % cream Apply 1 application topically daily. Apply to affected area daily plus dry dressing (Patient not taking: Reported on 03/15/2019) 400 g 3   No current facility-administered medications for this visit.     REVIEW OF SYSTEMS:   [X]  denotes positive finding, [ ]  denotes negative finding Cardiac  Comments:  Chest pain or chest pressure:    Shortness of breath upon exertion:    Short of breath when lying flat:      Irregular heart rhythm:        Vascular    Pain in calf, thigh, or hip brought on by ambulation:    Pain in feet at night that wakes you up from your sleep:     Blood  clot in your veins:    Leg swelling:         Pulmonary    Oxygen at home:    Productive cough:     Wheezing:         Neurologic    Sudden weakness in arms or legs:     Sudden numbness in arms or legs:     Sudden onset of difficulty speaking or slurred speech:    Temporary loss of vision in one eye:     Problems with dizziness:         Gastrointestinal    Blood in stool:      Vomited blood:         Genitourinary    Burning when urinating:     Blood in urine:        Psychiatric    Major depression:         Hematologic    Bleeding problems:    Problems with blood clotting too easily:        Skin    Rashes or ulcers:        Constitutional    Fever or chills:     PHYSICAL EXAM:   Vitals:   03/15/19 0912  BP: (!) 159/81  Pulse: 72  Resp: 16  Temp: (!) 96.7 F (35.9 C)  TempSrc: Temporal  SpO2: 98%  Weight: 212 lb (96.2 kg)  Height: 5\' 10"  (1.778 m)    GENERAL: The patient is a well-nourished male, in no acute distress. The vital signs are documented above. CARDIAC: There is a regular rate and rhythm.  VASCULAR: Palpable left radial and brachial pulse PULMONARY: Nonlabored respirations MUSCULOSKELETAL: There are no major deformities or cyanosis. NEUROLOGIC: No focal weakness or paresthesias are detected. SKIN: There are no ulcers or rashes noted. PSYCHIATRIC: The patient has a normal affect.  STUDIES:   I have ordered and reviewed the following: +-----------------+-------------+----------+--------------+ Right Cephalic   Diameter (cm)Depth (cm)   Findings    +-----------------+-------------+----------+--------------+ Shoulder             27.00                             +-----------------+-------------+----------+--------------+ Prox upper arm     0.330.16                branching    +-----------------+-------------+----------+--------------+ Mid upper arm     0.29 / 0.27             branching    +-----------------+-------------+----------+--------------+ Dist upper arm    0.29 / 0.20             branching    +-----------------+-------------+----------+--------------+ Antecubital fossa                       not visualized +-----------------+-------------+----------+--------------+ Prox forearm      0.26 / 0.24                          +-----------------+-------------+----------+--------------+ Mid forearm       0.31 / 0.16             branching    +-----------------+-------------+----------+--------------+ Dist forearm                            not visualized +-----------------+-------------+----------+--------------+  +-----------------+-------------+----------+--------------+ Right Basilic    Diameter (cm)Depth (cm)  Findings    +-----------------+-------------+----------+--------------+ Shoulder                                not visualized +-----------------+-------------+----------+--------------+ Prox upper arm                          not visualized +-----------------+-------------+----------+--------------+ Mid upper arm        0.44                   joins      +-----------------+-------------+----------+--------------+ Dist upper arm       0.46                 branching    +-----------------+-------------+----------+--------------+ Antecubital fossa 0.36 / 0.32                          +-----------------+-------------+----------+--------------+ Prox forearm         0.21                              +-----------------+-------------+----------+--------------+  +-----------------+-------------+----------+---------+ Left Cephalic    Diameter (cm)Depth (cm)Findings  +-----------------+-------------+----------+---------+ Shoulder             0.28                          +-----------------+-------------+----------+---------+ Prox upper arm    0.29 / 0.24                     +-----------------+-------------+----------+---------+ Mid upper arm        0.26                         +-----------------+-------------+----------+---------+ Dist upper arm       0.36               tortuous  +-----------------+-------------+----------+---------+ Antecubital fossa    0.37                         +-----------------+-------------+----------+---------+ Prox forearm         0.31                         +-----------------+-------------+----------+---------+ Mid forearm       0.35 / 0.23           branching +-----------------+-------------+----------+---------+ Dist forearm      0.37 / 0.32                     +-----------------+-------------+----------+---------+  +-----------------+-------------+----------+---------+ Left Basilic     Diameter (cm)Depth (cm)Findings  +-----------------+-------------+----------+---------+ Prox upper arm    0.58 / 0.54                     +-----------------+-------------+----------+---------+ Mid upper arm        0.60                         +-----------------+-------------+----------+---------+ Dist upper arm       0.57               branching +-----------------+-------------+----------+---------+ Antecubital fossa 0.45 / 0.19           branching +-----------------+-------------+----------+---------+ Prox forearm  too small +-----------------+-------------+----------+---------+  ASSESSMENT and PLAN   CKD 5: We discussed proceeding with a left arm fistula.  I suspect this will be a brachiocephalic fistula, however I did discuss that if we used the basilic vein, he would need a staged procedure.  I discussed the risk of not maturity as well as the risk of steal syndrome.  All of his questions were answered.  I will proceed on Thursday, December  17.   Leia Alf, MD, FACS [facility] [phone] Pager [phone]

## 2019-03-15 NOTE — Progress Notes (Signed)
[facility] [place]  Patient name: [name] MRN: [id number] DOB: May 08, 1950 Sex: male   REQUESTING PROVIDER:   Dr. Hollie Salk   REASON FOR CONSULT:    Dialysis access  HISTORY OF PRESENT ILLNESS:   [name] is a 68 y.o. male, who is referred for evaluation of dialysis access.  He is not yet started dialysis.  He is right-handed.  His renal failure secondary to diabetes and hypertension.  Patient has a longstanding history of diabetes which has been complicated by neuropathy and nephropathy.  He has a history of a left fifth toe amputation.  He also suffers from coronary artery disease.  He is a former smoker.  PAST MEDICAL HISTORY    Past Medical History:  Diagnosis Date  . Cervical myelopathy ([place]) 02/06/2018  . Coronary artery disease   . Diabetes mellitus without complication ([place])   . Diabetic neuropathy ([place]) 02/06/2018  . Gait abnormality 08/24/2018  . Hypercholesteremia   . Hypertension   . Neuropathy   . Prostate cancer ([place])      FAMILY HISTORY   Family History  Problem Relation Age of Onset  . Diabetes Mellitus II Mother   . Diabetes Mellitus II Father   . CAD Father   . Cancer Father        prostate  . Diabetes Mellitus II Brother   . Diabetes Mellitus II Brother   . Cancer Brother     SOCIAL HISTORY:   Social History   Socioeconomic History  . Marital status: Married    Spouse name: Not on file  . Number of children: Not on file  . Years of education: Not on file  . Highest education level: Not on file  Occupational History  . Not on file  Social Needs  . Financial resource strain: Not on file  . Food insecurity    Worry: Not on file    Inability: Not on file  . Transportation needs    Medical: Not on file    Non-medical: Not on file  Tobacco Use  . Smoking status: Former Smoker    Types: Cigars    Quit date: 12/24/1988    Years since quitting: 30.2  . Smokeless tobacco: Never  Used  Substance and Sexual Activity  . Alcohol use: No  . Drug use: No  . Sexual activity: Yes    Partners: Female  Lifestyle  . Physical activity    Days per week: Not on file    Minutes per session: Not on file  . Stress: Not on file  Relationships  . Social Herbalist on phone: Not on file    Gets together: Not on file    Attends religious service: Not on file    Active member of club or organization: Not on file    Attends meetings of clubs or organizations: Not on file    Relationship status: Not on file  . Intimate partner violence    Fear of current or ex partner: Not on file    Emotionally abused: Not on file    Physically abused: Not on file    Forced sexual activity: Not on file  Other Topics Concern  . Not on file  Social History Narrative   Regular exercise: yes 3 times a week   Caffeine use: hot tea    ALLERGIES:    Allergies  Allergen Reactions  . Mushroom Extract Complex Nausea Only    CURRENT MEDICATIONS:  Current Outpatient Medications  Medication Sig Dispense Refill  . acetaminophen (TYLENOL) 500 MG tablet Take 1,500 mg by mouth every 6 (six) hours as needed (pain).    Marland Kitchen allopurinol (ZYLOPRIM) 100 MG tablet Take 200 mg by mouth every morning.     . Alprostadil, Vasodilator, (CAVERJECT IC) 1 Dose by Intracavernosal route daily as needed (erectile dysfunction).    Marland Kitchen aspirin EC 81 MG tablet Take 81 mg by mouth every morning.     . calcitRIOL (ROCALTROL) 0.25 MCG capsule Take 0.25 mcg by mouth every morning.     . carvedilol (COREG) 25 MG tablet Take 25 mg by mouth 2 (two) times daily.     . diclofenac sodium (VOLTAREN) 1 % GEL Apply 1 application topically 3 (three) times daily as needed (pain).     Marland Kitchen docusate sodium (STOOL SOFTENER) 100 MG capsule Take 100 mg by mouth 2 (two) times daily.    . DULoxetine (CYMBALTA) 60 MG capsule Take 1 capsule (60 mg total) by mouth daily. 90 capsule 1  . fenofibrate 160 MG tablet Take 160 mg by mouth  at bedtime.    . Insulin Degludec (TRESIBA FLEXTOUCH) 200 UNIT/ML SOPN Inject 40 Units into the skin at bedtime. (Patient taking differently: Inject 24 Units into the skin at bedtime. )    . levothyroxine (SYNTHROID, LEVOTHROID) 112 MCG tablet Take 112 mcg by mouth daily before breakfast. BRAND NAME SYNTHROID    . LYRICA 150 MG capsule Take 1 capsule (150 mg total) by mouth at bedtime. (Patient taking differently: Take 150 mg by mouth 2 (two) times daily. )    . nitroGLYCERIN (NITRODUR - DOSED IN MG/24 HR) 0.2 mg/hr patch Place 1 patch (0.2 mg total) onto the skin daily. 30 patch 12  . OVER THE COUNTER MEDICATION Take 1 tablet by mouth 3 (three) times daily as needed (leg cramps). Hyland's Leg Cramps    . pentoxifylline (TRENTAL) 400 MG CR tablet Take 1 tablet (400 mg total) by mouth 3 (three) times daily with meals. 90 tablet 3  . rosuvastatin (CRESTOR) 10 MG tablet Take 10 mg by mouth at bedtime.     . Semaglutide, 1 MG/DOSE, (OZEMPIC, 1 MG/DOSE,) 2 MG/1.5ML SOPN Inject 1 mg into the skin every Friday.    . triamcinolone (KENALOG) 0.025 % cream Apply 1 application topically daily as needed (psoriasis).    Marland Kitchen amLODipine (NORVASC) 5 MG tablet Take 5 mg by mouth every morning.     Marland Kitchen oxyCODONE-acetaminophen (PERCOCET/ROXICET) 5-325 MG tablet Take 1 tablet by mouth every 4 (four) hours as needed for severe pain. (Patient not taking: Reported on 03/15/2019) 30 tablet 0  . Probiotic Product (PROBIOTIC PO) Take 1 tablet by mouth every morning.    . silver sulfADIAZINE (SILVADENE) 1 % cream Apply 1 application topically daily. Apply to affected area daily plus dry dressing (Patient not taking: Reported on 03/15/2019) 400 g 3   No current facility-administered medications for this visit.     REVIEW OF SYSTEMS:   [X]  denotes positive finding, [ ]  denotes negative finding Cardiac  Comments:  Chest pain or chest pressure:    Shortness of breath upon exertion:    Short of breath when lying flat:     Irregular heart rhythm:        Vascular    Pain in calf, thigh, or hip brought on by ambulation:    Pain in feet at night that wakes you up from your sleep:     Blood clot  in your veins:    Leg swelling:         Pulmonary    Oxygen at home:    Productive cough:     Wheezing:         Neurologic    Sudden weakness in arms or legs:     Sudden numbness in arms or legs:     Sudden onset of difficulty speaking or slurred speech:    Temporary loss of vision in one eye:     Problems with dizziness:         Gastrointestinal    Blood in stool:      Vomited blood:         Genitourinary    Burning when urinating:     Blood in urine:        Psychiatric    Major depression:         Hematologic    Bleeding problems:    Problems with blood clotting too easily:        Skin    Rashes or ulcers:        Constitutional    Fever or chills:     PHYSICAL EXAM:   Vitals:   03/15/19 0912  BP: (!) 159/81  Pulse: 72  Resp: 16  Temp: (!) 96.7 F (35.9 C)  TempSrc: Temporal  SpO2: 98%  Weight: 212 lb (96.2 kg)  Height: 5\' 10"  (1.778 m)    GENERAL: The patient is a well-nourished male, in no acute distress. The vital signs are documented above. CARDIAC: There is a regular rate and rhythm.  VASCULAR: Palpable left radial and brachial pulse PULMONARY: Nonlabored respirations MUSCULOSKELETAL: There are no major deformities or cyanosis. NEUROLOGIC: No focal weakness or paresthesias are detected. SKIN: There are no ulcers or rashes noted. PSYCHIATRIC: The patient has a normal affect.  STUDIES:   I have ordered and reviewed the following: +-----------------+-------------+----------+--------------+ Right Cephalic   Diameter (cm)Depth (cm)   Findings    +-----------------+-------------+----------+--------------+ Shoulder             27.00                             +-----------------+-------------+----------+--------------+ Prox upper arm     0.330.16                branching    +-----------------+-------------+----------+--------------+ Mid upper arm     0.29 / 0.27             branching    +-----------------+-------------+----------+--------------+ Dist upper arm    0.29 / 0.20             branching    +-----------------+-------------+----------+--------------+ Antecubital fossa                       not visualized +-----------------+-------------+----------+--------------+ Prox forearm      0.26 / 0.24                          +-----------------+-------------+----------+--------------+ Mid forearm       0.31 / 0.16             branching    +-----------------+-------------+----------+--------------+ Dist forearm                            not visualized +-----------------+-------------+----------+--------------+  +-----------------+-------------+----------+--------------+ Right Basilic    Diameter (cm)Depth (cm)  Findings    +-----------------+-------------+----------+--------------+ Shoulder                                not visualized +-----------------+-------------+----------+--------------+ Prox upper arm                          not visualized +-----------------+-------------+----------+--------------+ Mid upper arm        0.44                   joins      +-----------------+-------------+----------+--------------+ Dist upper arm       0.46                 branching    +-----------------+-------------+----------+--------------+ Antecubital fossa 0.36 / 0.32                          +-----------------+-------------+----------+--------------+ Prox forearm         0.21                              +-----------------+-------------+----------+--------------+  +-----------------+-------------+----------+---------+ Left Cephalic    Diameter (cm)Depth (cm)Findings  +-----------------+-------------+----------+---------+ Shoulder             0.28                          +-----------------+-------------+----------+---------+ Prox upper arm    0.29 / 0.24                     +-----------------+-------------+----------+---------+ Mid upper arm        0.26                         +-----------------+-------------+----------+---------+ Dist upper arm       0.36               tortuous  +-----------------+-------------+----------+---------+ Antecubital fossa    0.37                         +-----------------+-------------+----------+---------+ Prox forearm         0.31                         +-----------------+-------------+----------+---------+ Mid forearm       0.35 / 0.23           branching +-----------------+-------------+----------+---------+ Dist forearm      0.37 / 0.32                     +-----------------+-------------+----------+---------+  +-----------------+-------------+----------+---------+ Left Basilic     Diameter (cm)Depth (cm)Findings  +-----------------+-------------+----------+---------+ Prox upper arm    0.58 / 0.54                     +-----------------+-------------+----------+---------+ Mid upper arm        0.60                         +-----------------+-------------+----------+---------+ Dist upper arm       0.57               branching +-----------------+-------------+----------+---------+ Antecubital fossa 0.45 / 0.19           branching +-----------------+-------------+----------+---------+ Prox forearm  too small +-----------------+-------------+----------+---------+  ASSESSMENT and PLAN   CKD 5: We discussed proceeding with a left arm fistula.  I suspect this will be a brachiocephalic fistula, however I did discuss that if we used the basilic vein, he would need a staged procedure.  I discussed the risk of not maturity as well as the risk of steal syndrome.  All of his questions were answered.  I will proceed on Thursday, December  17.   Leia Alf, MD, FACS [facility] [phone] Pager [phone]

## 2019-03-16 ENCOUNTER — Encounter: Payer: Self-pay | Admitting: Orthopedic Surgery

## 2019-03-16 NOTE — Progress Notes (Signed)
Office Visit Note   Patient: [name]           Date of Birth: 24-Mar-1951           MRN: [id number] Visit Date: 02/25/2019              Requested by: Fanny Bien, [name] [place],  [place] [address] PCP: Fanny Bien, MD  Chief Complaint  Patient presents with  . Right Foot - Routine Post Op    12/15/2018  Right foot 4th & 5th       HPI: Patient is a 68 year old gentleman who presents 2 months status post right foot fourth and fifth ray amputation.  Patient states he has a follow-up with [facility] after Thanksgiving.  Assessment & Plan: Visit Diagnoses:  1. Partial nontraumatic amputation of foot, right ([place])     Plan: Patient will follow up with [facility] for orthotics and a protective donut to unload pressure from the base of the fifth metatarsal.  Follow-Up Instructions: Return in about 4 weeks (around 03/25/2019).   Ortho Exam  Patient is alert, oriented, no adenopathy, well-dressed, normal affect, normal respiratory effort. Examination the incision is well-healed patient does have a prominent base of the fifth metatarsal.  There is a small area of hyper granulation tissue that was touched with silver nitrate.  Imaging: No results found. No images are attached to the encounter.  Labs: Lab Results  Component Value Date   HGBA1C 6.3 (H) 12/14/2018   HGBA1C 6.0 (H) 12/24/2015   HGBA1C 7.0 (H) 12/25/2013   ESRSEDRATE 131 (H) 12/14/2018   ESRSEDRATE 16 02/06/2018   ESRSEDRATE 53 (H) 09/15/2012   CRP 32.1 (H) 12/14/2018   REPTSTATUS 12/18/2018 FINAL 12/15/2018   GRAMSTAIN  12/15/2018    FEW WBC PRESENT,BOTH PMN AND MONONUCLEAR FEW GRAM POSITIVE COCCI FEW GRAM NEGATIVE RODS RARE GRAM POSITIVE RODS    CULT  12/15/2018    FEW ESCHERICHIA COLI FEW CORYNEBACTERIUM STRIATUM FEW PREVOTELLA MELANINOGENICA BETA LACTAMASE POSITIVE Performed at [facility], [place] [address], [place], [place] [address]    LABORGA ESCHERICHIA COLI  12/15/2018     Lab Results  Component Value Date   ALBUMIN 3.0 (L) 12/14/2018   ALBUMIN 3.7 12/24/2015   ALBUMIN 3.7 12/28/2014   PREALBUMIN 14.8 (L) 12/14/2018    No results found for: MG No results found for: VD25OH  Lab Results  Component Value Date   PREALBUMIN 14.8 (L) 12/14/2018   CBC EXTENDED Latest Ref Rng & Units 12/18/2018 12/17/2018 12/16/2018  WBC 4.0 - 10.5 K/uL 5.1 5.1 7.1  RBC 4.22 - 5.81 MIL/uL 2.58(L) 2.48(L) 2.54(L)  HGB 13.0 - 17.0 g/dL 7.4(L) 7.2(L) 7.2(L)  HCT 39.0 - 52.0 % 23.3(L) 22.7(L) 23.4(L)  PLT 150 - 400 K/uL 170 159 145(L)  NEUTROABS 1.7 - 7.7 K/uL 3.7 4.1 6.1  LYMPHSABS 0.7 - 4.0 K/uL 0.5(L) 0.4(L) 0.4(L)     Body mass index is 33.43 kg/m.  Orders:  No orders of the defined types were placed in this encounter.  No orders of the defined types were placed in this encounter.    Procedures: No procedures performed  Clinical Data: No additional findings.  ROS:  All other systems negative, except as noted in the HPI. Review of Systems  Objective: Vital Signs: Ht 5\' 10"  (1.778 m)   Wt 233 lb (105.7 kg)   BMI 33.43 kg/m   Specialty Comments:  No specialty comments available.  PMFS History: Patient  Active Problem List   Diagnosis Date Noted  . Cutaneous abscess of right foot   . Osteomyelitis ([place]) 12/14/2018  . AKI (acute kidney injury) ([place]) 12/14/2018  . CKD (chronic kidney disease) stage 4, GFR 15-29 ml/min (HCC) 12/14/2018  . Gait abnormality 08/24/2018  . Diabetic neuropathy ([place]) 02/06/2018  . Cervical myelopathy ([place]) 02/06/2018  . Onychomycosis 10/30/2017  . Other spondylosis with radiculopathy, cervical region 01/27/2017  . Midfoot ulcer, left, limited to breakdown of skin ([place]) 11/15/2016  . Lateral epicondylitis, left elbow 08/12/2016  . Cellulitis of fifth toe of right foot 07/29/2016  . Prostate cancer ([place]) 06/19/2016  . Non-pressure chronic ulcer of other part of right foot limited to breakdown of skin ([place])  04/04/2016  . Cellulitis of leg, right 12/24/2015  . Cellulitis of right lower extremity 12/24/2015  . CKD (chronic kidney disease), stage III 12/25/2013  . Foot infection 12/24/2013  . Foot ulcer, left ([place]) 12/24/2013  . Diabetic foot ulcer ([place]) 09/14/2012  . Gout 09/14/2012  . HTN (hypertension) 09/14/2012  . Hypothyroidism 09/14/2012  . CAD (coronary artery disease) 09/14/2012  . Diabetes mellitus ([place]) 09/14/2012  . Hypercholesteremia   . Neuropathy [facility])    Past Medical History:  Diagnosis Date  . Cervical myelopathy ([place]) 02/06/2018  . Coronary artery disease   . Diabetes mellitus without complication ([place])   . Diabetic neuropathy ([place]) 02/06/2018  . Gait abnormality 08/24/2018  . Hypercholesteremia   . Hypertension   . Neuropathy   . Prostate cancer [facility])     Family History  Problem Relation Age of Onset  . Diabetes Mellitus II Mother   . Diabetes Mellitus II Father   . CAD Father   . Cancer Father        prostate  . Diabetes Mellitus II Brother   . Diabetes Mellitus II Brother   . Cancer Brother     Past Surgical History:  Procedure Laterality Date  . AMPUTATION Left 12/25/2013   Procedure: AMPUTATION RAY LEFT 5TH RAY;  Surgeon: [name], MD;  Location: [facility];  Service: Orthopedics;  Laterality: Left;  . AMPUTATION Right 12/15/2018   Procedure: AMPUTATION OF 4TH AND 5TH TOES RIGHT FOOT;  Surgeon: [name], MD;  Location: [place];  Service: Orthopedics;  Laterality: Right;  . APPLICATION OF WOUND VAC Right 12/15/2018   Procedure: APPLICATION OF WOUND VAC;  Surgeon: [name], MD;  Location: [place];  Service: Orthopedics;  Laterality: Right;  . BACK SURGERY    . CHOLECYSTECTOMY    . CORONARY ARTERY BYPASS GRAFT    . I&D EXTREMITY Right 12/15/2018   Procedure: DEBRIDEMENT RIGHT FOOT;  Surgeon: [name], MD;  Location: [place];  Service: Orthopedics;  Laterality: Right;  . NECK SURGERY     novemver 2019   Social History   Occupational History  .  Not on file  Tobacco Use  . Smoking status: Former Smoker    Types: Cigars    Quit date: 12/24/1988    Years since quitting: 30.2  . Smokeless tobacco: Never Used  Substance and Sexual Activity  . Alcohol use: No  . Drug use: No  . Sexual activity: Yes    Partners: Female

## 2019-03-17 ENCOUNTER — Ambulatory Visit: Payer: Self-pay | Admitting: Neurology

## 2019-03-23 ENCOUNTER — Other Ambulatory Visit (HOSPITAL_COMMUNITY)
Admission: RE | Admit: 2019-03-23 | Discharge: 2019-03-23 | Disposition: A | Discharge status: Home / Self Care | Payer: Medicare Other | Source: Ambulatory Visit | Attending: Surgery | Admitting: Surgery

## 2019-03-23 DIAGNOSIS — Z01812 Encounter for preprocedural laboratory examination: Secondary | ICD-10-CM | POA: Diagnosis present

## 2019-03-23 DIAGNOSIS — Z20828 Contact with and (suspected) exposure to other viral communicable diseases: Secondary | ICD-10-CM | POA: Insufficient documentation

## 2019-03-23 LAB — SARS CORONAVIRUS 2 (TAT 6-24 HRS): SARS Coronavirus 2: NEGATIVE

## 2019-03-24 ENCOUNTER — Other Ambulatory Visit: Payer: Self-pay

## 2019-03-24 ENCOUNTER — Encounter (HOSPITAL_COMMUNITY): Payer: Self-pay | Admitting: Surgery

## 2019-03-24 NOTE — Anesthesia Preprocedure Evaluation (Addendum)
Anesthesia Evaluation  Patient identified by MRN, date of birth, ID band Patient awake    Reviewed: Allergy & Precautions, NPO status , Patient's Chart, lab work & pertinent test results, reviewed documented beta blocker date and time   Airway Mallampati: III  TM Distance: >3 FB Neck ROM: Limited    Dental  (+) Caps, Implants, Dental Advisory Given   Pulmonary former smoker,    Pulmonary exam normal        Cardiovascular hypertension, Pt. on medications and Pt. on home beta blockers + CAD and + CABG  Normal cardiovascular exam     Neuro/Psych negative neurological ROS     GI/Hepatic Neg liver ROS, GERD  ,  Endo/Other  diabetes, Insulin DependentHypothyroidism   Renal/GU Renal disease     Musculoskeletal  (+) Arthritis ,   Abdominal   Peds  Hematology negative hematology ROS (+)   Anesthesia Other Findings   Reproductive/Obstetrics                           Anesthesia Physical Anesthesia Plan  ASA: III  Anesthesia Plan: MAC   Post-op Pain Management:    Induction:   PONV Risk Score and Plan: 1 and Ondansetron and Treatment may vary due to age or medical condition  Airway Management Planned: Natural Airway and Simple Face Mask  Additional Equipment:   Intra-op Plan:   Post-operative Plan:   Informed Consent:   Plan Discussed with:   Anesthesia Plan Comments: (Follows with Dr. Einar Gip for hx of CAD s/p CABG 2005. Last seen 05/18/18. Per note he was doing well from cardiac standpoint and advised to f/u in 1 year.  ESRD not yet on HD.   IDDMII, last A1c 6.3 on 12/14/18.  Will need DOS labs and eval.  EKG 05/18/2018: Sinus rhythm with first-degree AV block at the rate of 81 bpm, normal axis, incomplete right bundle branch block.  Nonspecific T abnormality. No significant change from EKG 05/19/2017.  Echo 06/08/13: 1. Left ventricular cavity is normal in size. Mild concentric  hypertrophy. Normal global wall motion. Normal systolic global function. Calculated EF 57%. Visual EF is 60-65%. 2. Left atrial cavity is mildly dilated. Mild aneurysmal motion of the interatrial septum. 3. Mild aortic valve leaflet calcification. 4. Mild calcification of the mitral annulus. Mild mitral regurgitation. 5. Tricuspid valve structurally normal. Mild tricuspid regurgitation.  Nuclear stress 05/28/2013: 1. Resting EKG NSR, Poor R wave progression. Low voltage complexes. Stress EKG was non diagnostic for ischemia. No ST-T changes of ischemia noted with pharmacologic stress testing.  2. The perfusion study demonstrated normal isotope uptake both at rest and stress. There was no evidence of ischemia or scar. Dynamic gated images reveal normal wall motion and endocardial thickening. Left ventricular ejection fraction was estimated to be 67%.)       Anesthesia Quick Evaluation

## 2019-03-24 NOTE — Progress Notes (Signed)
Anesthesia Chart Review: Same day workup  Follows with Dr. Einar Gip for hx of CAD s/p CABG 2005. Last seen 05/18/18. Per note he was doing well from cardiac standpoint and advised to f/u in 1 year.  ESRD not yet on HD.   IDDMII, last A1c 6.3 on 12/14/18.  Will need DOS labs and eval.  EKG 05/18/2018: Sinus rhythm with first-degree AV block at the rate of 81 bpm, normal axis, incomplete right bundle branch block.  Nonspecific T abnormality. No significant change from EKG 05/19/2017.  Echo 06/08/13: 1. Left ventricular cavity is normal in size. Mild concentric hypertrophy. Normal global wall motion. Normal systolic global function. Calculated EF 57%. Visual EF is 60-65%. 2. Left atrial cavity is mildly dilated. Mild aneurysmal motion of the interatrial septum. 3. Mild aortic valve leaflet calcification. 4. Mild calcification of the mitral annulus. Mild mitral regurgitation. 5. Tricuspid valve structurally normal. Mild tricuspid regurgitation.  Nuclear stress 05/28/2013: 1. Resting EKG NSR, Poor R wave progression. Low voltage complexes. Stress EKG was non diagnostic for ischemia. No ST-T changes of ischemia noted with pharmacologic stress testing.  2. The perfusion study demonstrated normal isotope uptake both at rest and stress. There was no evidence of ischemia or scar. Dynamic gated images reveal normal wall motion and endocardial thickening. Left ventricular ejection fraction was estimated to be 67%.   Wynonia Musty [facility] Short Stay Center/Anesthesiology Phone [phone] 03/24/2019 1:47 PM

## 2019-03-25 ENCOUNTER — Ambulatory Visit (HOSPITAL_COMMUNITY): Payer: Medicare Other | Admitting: Physician Assistant

## 2019-03-25 ENCOUNTER — Ambulatory Visit (HOSPITAL_COMMUNITY)
Admission: RE | Admit: 2019-03-25 | Discharge: 2019-03-25 | Disposition: A | Discharge status: Home / Self Care | Payer: Medicare Other | Source: Ambulatory Visit | Attending: Surgery | Admitting: Surgery

## 2019-03-25 ENCOUNTER — Encounter (HOSPITAL_COMMUNITY)
Admission: RE | Disposition: A | Discharge status: Home / Self Care | Payer: Self-pay | Source: Ambulatory Visit | Attending: Surgery

## 2019-03-25 ENCOUNTER — Encounter (HOSPITAL_COMMUNITY): Payer: Self-pay | Admitting: Surgery

## 2019-03-25 ENCOUNTER — Ambulatory Visit: Payer: Medicare Other | Admitting: Orthopedic Surgery

## 2019-03-25 ENCOUNTER — Other Ambulatory Visit: Payer: Self-pay

## 2019-03-25 DIAGNOSIS — I2510 Atherosclerotic heart disease of native coronary artery without angina pectoris: Secondary | ICD-10-CM | POA: Insufficient documentation

## 2019-03-25 DIAGNOSIS — E1122 Type 2 diabetes mellitus with diabetic chronic kidney disease: Secondary | ICD-10-CM | POA: Insufficient documentation

## 2019-03-25 DIAGNOSIS — E7800 Pure hypercholesterolemia, unspecified: Secondary | ICD-10-CM | POA: Insufficient documentation

## 2019-03-25 DIAGNOSIS — Z87891 Personal history of nicotine dependence: Secondary | ICD-10-CM | POA: Insufficient documentation

## 2019-03-25 DIAGNOSIS — Z794 Long term (current) use of insulin: Secondary | ICD-10-CM | POA: Insufficient documentation

## 2019-03-25 DIAGNOSIS — Z79899 Other long term (current) drug therapy: Secondary | ICD-10-CM | POA: Insufficient documentation

## 2019-03-25 DIAGNOSIS — I120 Hypertensive chronic kidney disease with stage 5 chronic kidney disease or end stage renal disease: Secondary | ICD-10-CM | POA: Insufficient documentation

## 2019-03-25 DIAGNOSIS — N185 Chronic kidney disease, stage 5: Secondary | ICD-10-CM | POA: Insufficient documentation

## 2019-03-25 DIAGNOSIS — Z79890 Hormone replacement therapy (postmenopausal): Secondary | ICD-10-CM | POA: Insufficient documentation

## 2019-03-25 DIAGNOSIS — E1140 Type 2 diabetes mellitus with diabetic neuropathy, unspecified: Secondary | ICD-10-CM | POA: Diagnosis not present

## 2019-03-25 DIAGNOSIS — Z8546 Personal history of malignant neoplasm of prostate: Secondary | ICD-10-CM | POA: Diagnosis not present

## 2019-03-25 DIAGNOSIS — Z7982 Long term (current) use of aspirin: Secondary | ICD-10-CM | POA: Diagnosis not present

## 2019-03-25 HISTORY — DX: Type 2 diabetes mellitus with foot ulcer: E11.621

## 2019-03-25 HISTORY — DX: Hypothyroidism, unspecified: E03.9

## 2019-03-25 HISTORY — DX: Chronic kidney disease, unspecified: N18.9

## 2019-03-25 HISTORY — PX: AV FISTULA PLACEMENT: SHX1204

## 2019-03-25 HISTORY — DX: Gastro-esophageal reflux disease without esophagitis: K21.9

## 2019-03-25 HISTORY — DX: Personal history of urinary calculi: Z87.442

## 2019-03-25 LAB — POCT I-STAT, CHEM 8
BUN: 74 mg/dL — ABNORMAL HIGH (ref 8–23)
Calcium, Ion: 1.18 mmol/L (ref 1.15–1.40)
Chloride: 112 mmol/L — ABNORMAL HIGH (ref 98–111)
Creatinine, Ser: 4.6 mg/dL — ABNORMAL HIGH (ref 0.61–1.24)
Glucose, Bld: 114 mg/dL — ABNORMAL HIGH (ref 70–99)
HCT: 27 % — ABNORMAL LOW (ref 39.0–52.0)
Hemoglobin: 9.2 g/dL — ABNORMAL LOW (ref 13.0–17.0)
Potassium: 4.8 mmol/L (ref 3.5–5.1)
Sodium: 142 mmol/L (ref 135–145)
TCO2: 19 mmol/L — ABNORMAL LOW (ref 22–32)

## 2019-03-25 LAB — GLUCOSE, CAPILLARY
Glucose-Capillary: 114 mg/dL — ABNORMAL HIGH (ref 70–99)
Glucose-Capillary: 86 mg/dL (ref 70–99)

## 2019-03-25 SURGERY — ARTERIOVENOUS (AV) FISTULA CREATION
Anesthesia: Monitor Anesthesia Care | Site: Arm Upper | Laterality: Left

## 2019-03-25 MED ORDER — PROPOFOL 10 MG/ML IV BOLUS
INTRAVENOUS | Status: AC
  Filled 2019-03-25: qty 20

## 2019-03-25 MED ORDER — MIDAZOLAM HCL 5 MG/5ML IJ SOLN
INTRAMUSCULAR | Status: DC | PRN
  Administered 2019-03-25: 2 mg via INTRAVENOUS

## 2019-03-25 MED ORDER — PROPOFOL 500 MG/50ML IV EMUL
INTRAVENOUS | Status: DC | PRN
  Administered 2019-03-25: 75 ug/kg/min via INTRAVENOUS

## 2019-03-25 MED ORDER — PHENYLEPHRINE 40 MCG/ML (10ML) SYRINGE FOR IV PUSH (FOR BLOOD PRESSURE SUPPORT)
PREFILLED_SYRINGE | INTRAVENOUS | Status: AC
  Filled 2019-03-25: qty 10

## 2019-03-25 MED ORDER — FENTANYL CITRATE (PF) 250 MCG/5ML IJ SOLN
INTRAMUSCULAR | Status: AC
  Filled 2019-03-25: qty 5

## 2019-03-25 MED ORDER — LIDOCAINE 2% (20 MG/ML) 5 ML SYRINGE
INTRAMUSCULAR | Status: DC | PRN
  Administered 2019-03-25: 40 mg via INTRAVENOUS

## 2019-03-25 MED ORDER — SODIUM CHLORIDE 0.9 % IV SOLN
INTRAVENOUS | Status: AC
  Filled 2019-03-25: qty 1.2

## 2019-03-25 MED ORDER — CHLORHEXIDINE GLUCONATE 4 % EX LIQD: 60.0000 mL | Freq: Once | CUTANEOUS | Status: DC

## 2019-03-25 MED ORDER — ONDANSETRON HCL 4 MG/2ML IJ SOLN
INTRAMUSCULAR | Status: DC | PRN
  Administered 2019-03-25: 4 mg via INTRAVENOUS

## 2019-03-25 MED ORDER — LIDOCAINE-EPINEPHRINE (PF) 1 %-1:200000 IJ SOLN
INTRAMUSCULAR | Status: AC
  Filled 2019-03-25: qty 30

## 2019-03-25 MED ORDER — OXYCODONE-ACETAMINOPHEN 5-325 MG PO TABS: 1.0000 | ORAL_TABLET | Freq: Four times a day (QID) | ORAL | 0 refills | Status: DC | PRN

## 2019-03-25 MED ORDER — FENTANYL CITRATE (PF) 100 MCG/2ML IJ SOLN
INTRAMUSCULAR | Status: DC | PRN
  Administered 2019-03-25: 50 ug via INTRAVENOUS

## 2019-03-25 MED ORDER — ONDANSETRON HCL 4 MG/2ML IJ SOLN
INTRAMUSCULAR | Status: AC
  Filled 2019-03-25: qty 2

## 2019-03-25 MED ORDER — MIDAZOLAM HCL 2 MG/2ML IJ SOLN
INTRAMUSCULAR | Status: AC
  Filled 2019-03-25: qty 2

## 2019-03-25 MED ORDER — STERILE WATER FOR IRRIGATION IR SOLN
Status: DC | PRN
  Administered 2019-03-25: 1000 mL

## 2019-03-25 MED ORDER — LIDOCAINE 2% (20 MG/ML) 5 ML SYRINGE
INTRAMUSCULAR | Status: AC
  Filled 2019-03-25: qty 5

## 2019-03-25 MED ORDER — CEFAZOLIN SODIUM-DEXTROSE 2-4 GM/100ML-% IV SOLN
INTRAVENOUS | Status: AC
  Filled 2019-03-25: qty 100

## 2019-03-25 MED ORDER — 0.9 % SODIUM CHLORIDE (POUR BTL) OPTIME
TOPICAL | Status: DC | PRN
  Administered 2019-03-25: 08:00:00 1000 mL

## 2019-03-25 MED ORDER — CEFAZOLIN SODIUM-DEXTROSE 2-4 GM/100ML-% IV SOLN
2.0000 g | INTRAVENOUS | Status: AC
  Administered 2019-03-25: 2 g via INTRAVENOUS

## 2019-03-25 MED ORDER — SODIUM CHLORIDE 0.9 % IV SOLN
INTRAVENOUS | Status: DC | PRN
  Administered 2019-03-25: 500 mL

## 2019-03-25 MED ORDER — SODIUM CHLORIDE 0.9 % IV SOLN: INTRAVENOUS | Status: DC

## 2019-03-25 MED ORDER — PHENYLEPHRINE 40 MCG/ML (10ML) SYRINGE FOR IV PUSH (FOR BLOOD PRESSURE SUPPORT)
PREFILLED_SYRINGE | INTRAVENOUS | Status: DC | PRN
  Administered 2019-03-25 (×3): 80 ug via INTRAVENOUS

## 2019-03-25 MED ORDER — LIDOCAINE-EPINEPHRINE (PF) 1 %-1:200000 IJ SOLN
INTRAMUSCULAR | Status: DC | PRN
  Administered 2019-03-25: 3 mL

## 2019-03-25 SURGICAL SUPPLY — 32 items
ARMBAND PINK RESTRICT EXTREMIT (MISCELLANEOUS) ×4 IMPLANT
CANISTER SUCT 3000ML PPV (MISCELLANEOUS) ×2 IMPLANT
CLIP VESOCCLUDE MED 6/CT (CLIP) ×2 IMPLANT
CLIP VESOCCLUDE SM WIDE 6/CT (CLIP) ×2 IMPLANT
COVER PROBE W GEL 5X96 (DRAPES) ×2 IMPLANT
COVER WAND RF STERILE (DRAPES) ×2 IMPLANT
DERMABOND ADVANCED (GAUZE/BANDAGES/DRESSINGS) ×1
DERMABOND ADVANCED .7 DNX12 (GAUZE/BANDAGES/DRESSINGS) ×1 IMPLANT
ELECT REM PT RETURN 9FT ADLT (ELECTROSURGICAL) ×2
ELECTRODE REM PT RTRN 9FT ADLT (ELECTROSURGICAL) ×1 IMPLANT
GLOVE BIOGEL PI IND STRL 7.0 (GLOVE) IMPLANT
GLOVE BIOGEL PI IND STRL 7.5 (GLOVE) ×1 IMPLANT
GLOVE BIOGEL PI INDICATOR 7.0 (GLOVE) ×2
GLOVE BIOGEL PI INDICATOR 7.5 (GLOVE) ×1
GLOVE SURG SS PI 7.5 STRL IVOR (GLOVE) ×2 IMPLANT
GOWN STRL REUS W/ TWL LRG LVL3 (GOWN DISPOSABLE) ×2 IMPLANT
GOWN STRL REUS W/ TWL XL LVL3 (GOWN DISPOSABLE) ×1 IMPLANT
GOWN STRL REUS W/TWL LRG LVL3 (GOWN DISPOSABLE) ×3
GOWN STRL REUS W/TWL XL LVL3 (GOWN DISPOSABLE) ×1
HEMOSTAT SNOW SURGICEL 2X4 (HEMOSTASIS) IMPLANT
KIT BASIN OR (CUSTOM PROCEDURE TRAY) ×2 IMPLANT
KIT TURNOVER KIT B (KITS) ×2 IMPLANT
NS IRRIG 1000ML POUR BTL (IV SOLUTION) ×2 IMPLANT
PACK CV ACCESS (CUSTOM PROCEDURE TRAY) ×2 IMPLANT
PAD ARMBOARD 7.5X6 YLW CONV (MISCELLANEOUS) ×4 IMPLANT
SUT PROLENE 6 0 CC (SUTURE) ×4 IMPLANT
SUT VIC AB 3-0 SH 27 (SUTURE) ×1
SUT VIC AB 3-0 SH 27X BRD (SUTURE) ×1 IMPLANT
SUT VICRYL 4-0 PS2 18IN ABS (SUTURE) ×2 IMPLANT
TOWEL GREEN STERILE (TOWEL DISPOSABLE) ×2 IMPLANT
UNDERPAD 30X30 (UNDERPADS AND DIAPERS) ×2 IMPLANT
WATER STERILE IRR 1000ML POUR (IV SOLUTION) ×2 IMPLANT

## 2019-03-25 NOTE — Transfer of Care (Signed)
Immediate Anesthesia Transfer of Care Note  Patient: [name]  Procedure(s) Performed: LEFT ARM ARTERIOVENOUS (AV) FISTULA CREATION (Left Arm Upper)  Patient Location: PACU  Anesthesia Type:MAC  Level of Consciousness: oriented, drowsy and patient cooperative  Airway & Oxygen Therapy: Patient Spontanous Breathing and Patient connected to face mask oxygen  Post-op Assessment: Report given to RN and Post -op Vital signs reviewed and stable  Post vital signs: Reviewed  Last Vitals:  Vitals Value Taken Time  BP 117/67 03/25/19 0910  Temp    Pulse 73 03/25/19 0910  Resp 20 03/25/19 0910  SpO2 98 % 03/25/19 0910  Vitals shown include unvalidated device data.  Last Pain:  Vitals:   03/25/19 0624  TempSrc: Oral  PainSc:          Complications: No apparent anesthesia complications

## 2019-03-25 NOTE — Op Note (Signed)
    Patient name: [name] MRN: [id number] DOB: 1950/08/19 Sex: male  03/25/2019 Pre-operative Diagnosis: CKD Post-operative diagnosis:  Same Surgeon:  Annamarie Major Assistants:  Laurence Slate Procedure:   Left first stage basilic vein fistula Anesthesia: MAC Blood Loss: Minimal Specimens: None  Findings: Healthy appearing artery and vein.  The vein excepted a #5 dilator without difficulty  Indications: The patient is not yet on dialysis.  He comes in today for fistula creation  Procedure:  The patient was identified in the holding area and taken to [address]  The patient was then placed supine on the table. MAC anesthesia was administered.  The patient was prepped and draped in the usual sterile fashion.  A time out was called and antibiotics were administered.  Ultrasound was used to evaluate the cephalic and basilic vein in the upper arm.  In the mid arm the cephalic vein became rather small and tortuous and I did not feel was a good candidate for fistula therefore I elected to use the basilic vein which was of excellent diameter measuring about 5-6 mm.  1% lidocaine was used for local anesthesia.  An oblique incision was made just proximal to the antecubital crease.  I first dissected out the basilic vein.  This was a healthy appearing 4 mm vein.  It was fully mobilized and marked for orientation.  Next I dissected out the brachial artery which was a 5 mm disease-free artery.  The vein was then ligated distally.  It was flushed with heparin saline.  It distended nicely.  Next the brachial artery was occluded with vascular clamps.  A #11 blade was used to make an arteriotomy which was extended longitudinally with Potts scissors.  The vein was cut the appropriate length and spatulated but the size arteriotomy.  A running anastomosis was created 6-0 Prolene.  Prior to completion the appropriate flushing maneuvers were performed and the anastomosis was completed.  The vein did not distend as I  had expected.  It was a little more pulsatile just beyond the anastomosis.  I tried to further mobilize the vein however this did not change the vein diameter.  At this point, I elected to reoccluded the brachial artery and takedown the anastomosis.  The vein was flushed with heparin saline.  Sequential dilators were then passed.  The vein easily excepted the #5 dilator.  I then redid the anastomosis with 6-0 Prolene.  Once this was completed the clamps were released.  This time the vein distended nicely and had a palpable thrill.  There was also a palpable radial pulse.  The wound was then irrigated.  Hemostasis was achieved.  The incision was closed with 2 layers of Vicryl followed by Dermabond.  There are no immediate complications.   Disposition: To PACU stable.   Theotis Burrow, M.D., [facility] [place] Office: [phone] Pager:  [phone]

## 2019-03-25 NOTE — Discharge Instructions (Signed)
° °  [facility] [place] ° °Discharge Instructions ° °AV Fistula or Graft Surgery for Dialysis Access ° °Please refer to the following instructions for your post-procedure care. Your surgeon or physician assistant will discuss any changes with you. ° °Activity ° °You may drive the day following your surgery, if you are comfortable and no longer taking prescription pain medication. Resume full activity as the soreness in your incision resolves. ° °Bathing/Showering ° °You may shower after you go home. Keep your incision dry for 48 hours. Do not soak in a bathtub, hot tub, or swim until the incision heals completely. You may not shower if you have a hemodialysis catheter. ° °Incision Care ° °Clean your incision with mild soap and water after 48 hours. Pat the area dry with a clean towel. You do not need a bandage unless otherwise instructed. Do not apply any ointments or creams to your incision. You may have skin glue on your incision. Do not peel it off. It will come off on its own in about one week. Your arm may swell a bit after surgery. To reduce swelling use pillows to elevate your arm so it is above your heart. Your doctor will tell you if you need to lightly wrap your arm with an ACE bandage. ° °Diet ° °Resume your normal diet. There are not special food restrictions following this procedure. In order to heal from your surgery, it is CRITICAL to get adequate nutrition. Your body requires vitamins, minerals, and protein. Vegetables are the best source of vitamins and minerals. Vegetables also provide the perfect balance of protein. Processed food has little nutritional value, so try to avoid this. ° °Medications ° °Resume taking all of your medications. If your incision is causing pain, you may take over-the counter pain relievers such as acetaminophen (Tylenol). If you were prescribed a stronger pain medication, please be aware these medications can cause nausea and constipation. Prevent  nausea by taking the medication with a snack or meal. Avoid constipation by drinking plenty of fluids and eating foods with high amount of fiber, such as fruits, vegetables, and grains. Do not take Tylenol if you are taking prescription pain medications. ° ° ° ° °Follow up °Your surgeon may want to see you in the office following your access surgery. If so, this will be arranged at the time of your surgery. ° °Please call us immediately for any of the following conditions: ° °Increased pain, redness, drainage (pus) from your incision site °Fever of 101 degrees or higher °Severe or worsening pain at your incision site °Hand pain or numbness. ° °Reduce your risk of vascular disease: ° °Stop smoking. If you would like help, call [facility] at 1-800-QUIT-NOW (1-800-784-8669) or [place] at 336-586-4000 ° °Manage your cholesterol °Maintain a desired weight °Control your diabetes °Keep your blood pressure down ° °Dialysis ° °It will take several weeks to several months for your new dialysis access to be ready for use. Your surgeon will determine when it is OK to use it. Your nephrologist will continue to direct your dialysis. You can continue to use your Permcath until your new access is ready for use. ° °If you have any questions, please call the office at [phone]. ° °

## 2019-03-25 NOTE — Interval H&P Note (Signed)
History and Physical Interval Note:  03/25/2019 7:56 AM  [name]  has presented today for surgery, with the diagnosis of END STAGE RENAL DISEASE.  The various methods of treatment have been discussed with the patient and family. After consideration of risks, benefits and other options for treatment, the patient has consented to  Procedure(s): LEFT ARM ARTERIOVENOUS (AV) FISTULA CREATION (Left) as a surgical intervention.  The patient's history has been reviewed, patient examined, no change in status, stable for surgery.  I have reviewed the patient's chart and labs.  Questions were answered to the patient's satisfaction.     Annamarie Major

## 2019-03-25 NOTE — Anesthesia Postprocedure Evaluation (Signed)
Anesthesia Post Note  Patient: [name]  Procedure(s) Performed: LEFT ARM ARTERIOVENOUS (AV) FISTULA CREATION (Left Arm Upper)     Patient location during evaluation: PACU Anesthesia Type: MAC Level of consciousness: awake and alert Pain management: pain level controlled Vital Signs Assessment: post-procedure vital signs reviewed and stable Respiratory status: spontaneous breathing, nonlabored ventilation, respiratory function stable and patient connected to nasal cannula oxygen Cardiovascular status: stable and blood pressure returned to baseline Postop Assessment: no apparent nausea or vomiting Anesthetic complications: no    Last Vitals:  Vitals:   03/25/19 0925 03/25/19 0935  BP: 129/79   Pulse: 73 73  Resp: 16 14  Temp:    SpO2: 96% 98%    Last Pain:  Vitals:   03/25/19 0935  TempSrc:   PainSc: 0-No pain                 Tiajuana Amass

## 2019-03-25 NOTE — Anesthesia Procedure Notes (Signed)
Procedure Name: MAC Date/Time: 03/25/2019 7:40 AM Performed by: Jenne Campus, CRNA Pre-anesthesia Checklist: Patient identified, Emergency Drugs available, Suction available and Patient being monitored Oxygen Delivery Method: Simple face mask

## 2019-03-26 ENCOUNTER — Other Ambulatory Visit: Payer: Self-pay | Admitting: Orthopedic Surgery

## 2019-03-29 ENCOUNTER — Ambulatory Visit (INDEPENDENT_AMBULATORY_CARE_PROVIDER_SITE_OTHER): Payer: Medicare Other | Admitting: Orthopedic Surgery

## 2019-03-29 ENCOUNTER — Other Ambulatory Visit: Payer: Self-pay

## 2019-03-29 ENCOUNTER — Ambulatory Visit: Payer: Self-pay

## 2019-03-29 ENCOUNTER — Encounter: Payer: Self-pay | Admitting: Orthopedic Surgery

## 2019-03-29 DIAGNOSIS — M1A0210 Idiopathic chronic gout, right elbow, without tophus (tophi): Secondary | ICD-10-CM

## 2019-03-29 DIAGNOSIS — Z89431 Acquired absence of right foot: Secondary | ICD-10-CM | POA: Diagnosis not present

## 2019-03-29 DIAGNOSIS — I2510 Atherosclerotic heart disease of native coronary artery without angina pectoris: Secondary | ICD-10-CM | POA: Diagnosis not present

## 2019-03-29 DIAGNOSIS — M25521 Pain in right elbow: Secondary | ICD-10-CM

## 2019-03-29 DIAGNOSIS — L97421 Non-pressure chronic ulcer of left heel and midfoot limited to breakdown of skin: Secondary | ICD-10-CM

## 2019-03-29 NOTE — Progress Notes (Signed)
Office Visit Note   Patient: [name]           Date of Birth: 1951-03-30           MRN: [id number] Visit Date: 03/29/2019              Requested by: Fanny Bien, [address] [place],  [place] [address] PCP: Fanny Bien, MD  Chief Complaint  Patient presents with  . Right Foot - Pain, Follow-up  . Left Foot - Pain  . Right Elbow - Pain      HPI: Patient is a 68 year old gentleman who presents in follow-up for 3 separate issues #1 ulcer base of the fifth metatarsal left foot #2 ulcer base of the fifth metatarsal right foot status post fourth and fifth ray amputations right foot #3 swelling olecranon bursa right elbow.  Patient states he has a history of gout he is currently on allopurinol 100 mg twice a day he states he does not take colchicine.  Patient complains of a stinging pain in both heels he is on Lyrica.  Patient states he was placed on an antibiotic due to redness in his leg.  Assessment & Plan: Visit Diagnoses:  1. Pain in right elbow   2. Partial nontraumatic amputation of foot, right ([place])   3. Midfoot ulcer, left, limited to breakdown of skin ([place])   4. 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  We will draw a uric acid level and see if we need to increase his allopurinol or place him on some colchicine.  Discussed the importance of minimizing weightbearing of both feet he does have atrophy of the fat pads bilaterally and has thin atrophic skin he is following up with [employer] for new orthotics.  Follow-Up Instructions: Return in about 2 weeks (around 04/12/2019).   Ortho Exam  Patient is alert, oriented, no adenopathy, well-dressed, normal affect, normal respiratory effort. Examination patient has a good dorsalis pedis pulse bilaterally.  He has thin skin on both feet with loss of the  heel pad bilaterally essentially skin and bone with no fat pad bilaterally.  Patient has a very small superficial ulcer on the base the fifth metatarsal left foot that is about a millimeter diameter no cellulitis no drainage right foot he has healthy beefy granulation tissue over the lateral ulcer on the right foot he is currently using a nitroglycerin patch and a dry dressing change daily.  Discussed the importance of minimizing weightbearing.  Examination of the right elbow he does have some bursitis but this is nontender to palpation there is no cellulitis patient appears to have approximately 3 cc of fluid with bursitis that may either be due to his weight lifting or possibly due to flareup of gout we will draw a uric acid level today.  Radiographs are not obtained.  Imaging: No results found. No images are attached to the encounter.  Labs: Lab Results  Component Value Date   HGBA1C 6.3 (H) 12/14/2018   HGBA1C 6.0 (H) 12/24/2015   HGBA1C 7.0 (H) 12/25/2013   ESRSEDRATE 131 (H) 12/14/2018   ESRSEDRATE 16 02/06/2018   ESRSEDRATE 53 (H) 09/15/2012   CRP 32.1 (H) 12/14/2018   REPTSTATUS 12/18/2018 FINAL 12/15/2018   GRAMSTAIN  12/15/2018    FEW WBC PRESENT,BOTH PMN AND MONONUCLEAR FEW GRAM  POSITIVE COCCI FEW GRAM NEGATIVE RODS RARE GRAM POSITIVE RODS    CULT  12/15/2018    FEW ESCHERICHIA COLI FEW CORYNEBACTERIUM STRIATUM FEW PREVOTELLA MELANINOGENICA BETA LACTAMASE POSITIVE Performed at [facility], [place] [address], [place], [place] [address]    LABORGA ESCHERICHIA COLI 12/15/2018     Lab Results  Component Value Date   ALBUMIN 3.0 (L) 12/14/2018   ALBUMIN 3.7 12/24/2015   ALBUMIN 3.7 12/28/2014   PREALBUMIN 14.8 (L) 12/14/2018    No results found for: MG No results found for: VD25OH  Lab Results  Component Value Date   PREALBUMIN 14.8 (L) 12/14/2018   CBC EXTENDED Latest Ref Rng & Units 03/25/2019 12/18/2018 12/17/2018  WBC 4.0 - 10.5 K/uL - 5.1 5.1  RBC 4.22  - 5.81 MIL/uL - 2.58(L) 2.48(L)  HGB 13.0 - 17.0 g/dL 9.2(L) 7.4(L) 7.2(L)  HCT 39.0 - 52.0 % 27.0(L) 23.3(L) 22.7(L)  PLT 150 - 400 K/uL - 170 159  NEUTROABS 1.7 - 7.7 K/uL - 3.7 4.1  LYMPHSABS 0.7 - 4.0 K/uL - 0.5(L) 0.4(L)     There is no height or weight on file to calculate BMI.  Orders:  Orders Placed This Encounter  Procedures  . XR Elbow 2 Views Right   No orders of the defined types were placed in this encounter.    Procedures: No procedures performed  Clinical Data: No additional findings.  ROS:  All other systems negative, except as noted in the HPI. Review of Systems  Objective: Vital Signs: There were no vitals taken for this visit.  Specialty Comments:  No specialty comments available.  PMFS History: Patient Active Problem List   Diagnosis Date Noted  . Cutaneous abscess of right foot   . Osteomyelitis ([place]) 12/14/2018  . AKI (acute kidney injury) ([place]) 12/14/2018  . CKD (chronic kidney disease) stage 4, GFR 15-29 ml/min ([facility]) 12/14/2018  . Gait abnormality 08/24/2018  . Diabetic neuropathy ([place]) 02/06/2018  . Cervical myelopathy ([place]) 02/06/2018  . Onychomycosis 10/30/2017  . Other spondylosis with radiculopathy, cervical region 01/27/2017  . Midfoot ulcer, left, limited to breakdown of skin ([place]) 11/15/2016  . Lateral epicondylitis, left elbow 08/12/2016  . Cellulitis of fifth toe of right foot 07/29/2016  . Prostate cancer ([place]) 06/19/2016  . Non-pressure chronic ulcer of other part of right foot limited to breakdown of skin ([place]) 04/04/2016  . Cellulitis of leg, right 12/24/2015  . Cellulitis of right lower extremity 12/24/2015  . CKD (chronic kidney disease), stage III 12/25/2013  . Foot infection 12/24/2013  . Foot ulcer, left ([place]) 12/24/2013  . Diabetic foot ulcer ([place]) 09/14/2012  . Gout 09/14/2012  . HTN (hypertension) 09/14/2012  . Hypothyroidism 09/14/2012  . CAD (coronary artery disease) 09/14/2012  . Diabetes mellitus ([place])  09/14/2012  . Hypercholesteremia   . Neuropathy [facility])    Past Medical History:  Diagnosis Date  . Cervical myelopathy ([place]) 02/06/2018  . Chronic kidney disease   . Coronary artery disease   . Diabetes mellitus without complication ([place])   . Diabetic foot ulcer ([place])   . Diabetic foot ulcer ([place])   . Diabetic neuropathy ([place]) 02/06/2018  . Gait abnormality 08/24/2018  . GERD (gastroesophageal reflux disease)   . History of kidney stones   . Hypercholesteremia   . Hypertension   . Hypothyroidism   . Neuropathy   . Prostate cancer [name])     Family History  Problem Relation Age of Onset  . Diabetes Mellitus II Mother   .  Diabetes Mellitus II Father   . CAD Father   . Cancer Father        prostate  . Diabetes Mellitus II Brother   . Diabetes Mellitus II Brother   . Cancer Brother     Past Surgical History:  Procedure Laterality Date  . AMPUTATION Left 12/25/2013   Procedure: AMPUTATION RAY LEFT 5TH RAY;  Surgeon: [name], MD;  Location: [facility];  Service: Orthopedics;  Laterality: Left;  . AMPUTATION Right 12/15/2018   Procedure: AMPUTATION OF 4TH AND 5TH TOES RIGHT FOOT;  Surgeon: [name], MD;  Location: [facility];  Service: Orthopedics;  Laterality: Right;  . APPLICATION OF WOUND VAC Right 12/15/2018   Procedure: APPLICATION OF WOUND VAC;  Surgeon: [name], MD;  Location: [facility];  Service: Orthopedics;  Laterality: Right;  . AV FISTULA PLACEMENT Left 03/25/2019   Procedure: LEFT ARM ARTERIOVENOUS (AV) FISTULA CREATION;  Surgeon: [name], MD;  Location: [facility];  Service: Vascular;  Laterality: Left;  . BACK SURGERY    . CARDIAC CATHETERIZATION    . CHOLECYSTECTOMY    . CORONARY ARTERY BYPASS GRAFT    . I & D EXTREMITY Right 12/15/2018   Procedure: DEBRIDEMENT RIGHT FOOT;  Surgeon: [name], MD;  Location: [facility];  Service: Orthopedics;  Laterality: Right;  . NECK SURGERY     novemver 2019   Social History   Occupational History  . Not on file   Tobacco Use  . Smoking status: Former Smoker    Types: Cigars    Quit date: 12/24/1988    Years since quitting: 30.2  . Smokeless tobacco: Never Used  Substance and Sexual Activity  . Alcohol use: No  . Drug use: No  . Sexual activity: Yes    Partners: Female

## 2019-03-29 NOTE — Addendum Note (Signed)
Addended by: Lorayne Bender on: 03/29/2019 09:34 AM   Modules accepted: Orders

## 2019-03-30 ENCOUNTER — Telehealth: Payer: Self-pay | Admitting: *Deleted

## 2019-03-30 NOTE — Telephone Encounter (Signed)
Patient called c/o sudden onset of numbness in fingers Left hand. He denies pain with grip. He states his fingers are cool not cold, pink with good cap refill. He states he is at work and has not had arm elevated or been working fingers. I asked him to work his finger with grip and extend, elevation to decrease swelling. If no relief to call this office back or sched. Appointment. He desires to do the above first and will call back if no improvement for appt.

## 2019-04-06 ENCOUNTER — Ambulatory Visit: Payer: Medicare Other | Attending: Internal Medicine

## 2019-04-06 DIAGNOSIS — Z20822 Contact with and (suspected) exposure to covid-19: Secondary | ICD-10-CM

## 2019-04-07 LAB — NOVEL CORONAVIRUS, NAA: SARS-CoV-2, NAA: NOT DETECTED

## 2019-04-15 ENCOUNTER — Encounter: Payer: Self-pay | Admitting: Orthopedic Surgery

## 2019-04-15 ENCOUNTER — Ambulatory Visit (INDEPENDENT_AMBULATORY_CARE_PROVIDER_SITE_OTHER): Payer: Medicare Other | Admitting: Orthopedic Surgery

## 2019-04-15 ENCOUNTER — Other Ambulatory Visit: Payer: Self-pay

## 2019-04-15 ENCOUNTER — Telehealth: Payer: Self-pay

## 2019-04-15 DIAGNOSIS — M86271 Subacute osteomyelitis, right ankle and foot: Secondary | ICD-10-CM

## 2019-04-15 NOTE — Telephone Encounter (Signed)
I called [facility] about uric acid level that was obtained 03/29/19. The representative that I spoke with searched under name, dob and our client code and states that their is no lab result and that the order is not visible found. I see the order for this day collection time 9:34am. What do I do from here?

## 2019-04-15 NOTE — Progress Notes (Signed)
Office Visit Note   Patient: [name]           Date of Birth: 03-12-1951           MRN: [id number] Visit Date: 04/15/2019              Requested by: Fanny Bien, [address] [place],  [place] [address] PCP: Fanny Bien, MD  Chief Complaint  Patient presents with  . Right Foot - Follow-up      HPI: This is a pleasant 69 year old gentleman who is here in follow-up.  He is status post right foot fourth and fifth toe amputation.  He states that he is trying to plan a kidney transplant but this cannot be done until the foot is healed.  He does walk on it in a regular shoe.  He was to have a uric acid drawn but has not done this yet  Assessment & Plan: Visit Diagnoses: No diagnosis found.  Plan: He understands the importance of getting uric acid drawn we will try and do this..  We did discuss with him getting a knee scooter and being completely nonweightbearing as a way to improve his chance of a quicker recovery.  He said he did not think he needed this that 90% of the time at work he does not ambulate on his foot he will follow up in 3 weeks  Follow-Up Instructions: No follow-ups on file.   Ortho Exam  Patient is alert, oriented, no adenopathy, well-dressed, normal affect, normal respiratory effort. Right foot: Minimal to no soft tissue swelling.  Pulses are easily palpable.  No signs of cellulitis or infection.  At the proximal end of the wound there is a 3 x 1 cm area that has good granulation tissue no drainage no foul odor and is at the skin surface After verbal consent the area of the ulcer and callus was debrided to healthy soft wound edges Imaging: No results found. No images are attached to the encounter.  Labs: Lab Results  Component Value Date   HGBA1C 6.3 (H) 12/14/2018   HGBA1C 6.0 (H) 12/24/2015   HGBA1C 7.0 (H) 12/25/2013   ESRSEDRATE 131 (H) 12/14/2018   ESRSEDRATE 16 02/06/2018   ESRSEDRATE 53 (H) 09/15/2012   CRP 32.1 (H)  12/14/2018   REPTSTATUS 12/18/2018 FINAL 12/15/2018   GRAMSTAIN  12/15/2018    FEW WBC PRESENT,BOTH PMN AND MONONUCLEAR FEW GRAM POSITIVE COCCI FEW GRAM NEGATIVE RODS RARE GRAM POSITIVE RODS    CULT  12/15/2018    FEW ESCHERICHIA COLI FEW CORYNEBACTERIUM STRIATUM FEW PREVOTELLA MELANINOGENICA BETA LACTAMASE POSITIVE Performed at [facility], [place] [address] [place], [place] [address]    LABORGA ESCHERICHIA COLI 12/15/2018     Lab Results  Component Value Date   ALBUMIN 3.0 (L) 12/14/2018   ALBUMIN 3.7 12/24/2015   ALBUMIN 3.7 12/28/2014   PREALBUMIN 14.8 (L) 12/14/2018    No results found for: MG No results found for: VD25OH  Lab Results  Component Value Date   PREALBUMIN 14.8 (L) 12/14/2018   CBC EXTENDED Latest Ref Rng & Units 03/25/2019 12/18/2018 12/17/2018  WBC 4.0 - 10.5 K/uL - 5.1 5.1  RBC 4.22 - 5.81 MIL/uL - 2.58(L) 2.48(L)  HGB 13.0 - 17.0 g/dL 9.2(L) 7.4(L) 7.2(L)  HCT 39.0 - 52.0 % 27.0(L) 23.3(L) 22.7(L)  PLT 150 - 400 K/uL - 170 159  NEUTROABS 1.7 - 7.7 K/uL - 3.7 4.1  LYMPHSABS 0.7 - 4.0 K/uL -  0.5(L) 0.4(L)     There is no height or weight on file to calculate BMI.  Orders:  No orders of the defined types were placed in this encounter.  No orders of the defined types were placed in this encounter.    Procedures: No procedures performed  Clinical Data: No additional findings.  ROS:  All other systems negative, except as noted in the HPI. Review of Systems  Objective: Vital Signs: There were no vitals taken for this visit.  Specialty Comments:  No specialty comments available.  PMFS History: Patient Active Problem List   Diagnosis Date Noted  . Cutaneous abscess of right foot   . Osteomyelitis ([place]) 12/14/2018  . AKI (acute kidney injury) ([place]) 12/14/2018  . CKD (chronic kidney disease) stage 4, GFR 15-29 ml/min (HCC) 12/14/2018  . Gait abnormality 08/24/2018  . Diabetic neuropathy ([place]) 02/06/2018  . Cervical  myelopathy ([place]) 02/06/2018  . Onychomycosis 10/30/2017  . Other spondylosis with radiculopathy, cervical region 01/27/2017  . Midfoot ulcer, left, limited to breakdown of skin ([place]) 11/15/2016  . Lateral epicondylitis, left elbow 08/12/2016  . Cellulitis of fifth toe of right foot 07/29/2016  . Prostate cancer ([place]) 06/19/2016  . Non-pressure chronic ulcer of other part of right foot limited to breakdown of skin ([place]) 04/04/2016  . Cellulitis of leg, right 12/24/2015  . Cellulitis of right lower extremity 12/24/2015  . CKD (chronic kidney disease), stage III 12/25/2013  . Foot infection 12/24/2013  . Foot ulcer, left ([place]) 12/24/2013  . Diabetic foot ulcer ([place]) 09/14/2012  . Gout 09/14/2012  . HTN (hypertension) 09/14/2012  . Hypothyroidism 09/14/2012  . CAD (coronary artery disease) 09/14/2012  . Diabetes mellitus ([place]) 09/14/2012  . Hypercholesteremia   . Neuropathy [facility])    Past Medical History:  Diagnosis Date  . Cervical myelopathy ([name]) 02/06/2018  . Chronic kidney disease   . Coronary artery disease   . Diabetes mellitus without complication ([place])   . Diabetic foot ulcer ([name])   . Diabetic foot ulcer ([place])   . Diabetic neuropathy ([place]) 02/06/2018  . Gait abnormality 08/24/2018  . GERD (gastroesophageal reflux disease)   . History of kidney stones   . Hypercholesteremia   . Hypertension   . Hypothyroidism   . Neuropathy   . Prostate cancer [facility])     Family History  Problem Relation Age of Onset  . Diabetes Mellitus II Mother   . Diabetes Mellitus II Father   . CAD Father   . Cancer Father        prostate  . Diabetes Mellitus II Brother   . Diabetes Mellitus II Brother   . Cancer Brother     Past Surgical History:  Procedure Laterality Date  . AMPUTATION Left 12/25/2013   Procedure: AMPUTATION RAY LEFT 5TH RAY;  Surgeon: [name], MD;  Location: [facility];  Service: Orthopedics;  Laterality: Left;  . AMPUTATION Right 12/15/2018   Procedure: AMPUTATION OF 4TH  AND 5TH TOES RIGHT FOOT;  Surgeon: [name], MD;  Location: [facility];  Service: Orthopedics;  Laterality: Right;  . APPLICATION OF WOUND VAC Right 12/15/2018   Procedure: APPLICATION OF WOUND VAC;  Surgeon: [name], MD;  Location: [place];  Service: Orthopedics;  Laterality: Right;  . AV FISTULA PLACEMENT Left 03/25/2019   Procedure: LEFT ARM ARTERIOVENOUS (AV) FISTULA CREATION;  Surgeon: [name], MD;  Location: [place];  Service: Vascular;  Laterality: Left;  . BACK SURGERY    . CARDIAC  CATHETERIZATION    . CHOLECYSTECTOMY    . CORONARY ARTERY BYPASS GRAFT    . I & D EXTREMITY Right 12/15/2018   Procedure: DEBRIDEMENT RIGHT FOOT;  Surgeon: [name], MD;  Location: [place];  Service: Orthopedics;  Laterality: Right;  . NECK SURGERY     novemver 2019   Social History   Occupational History  . Not on file  Tobacco Use  . Smoking status: Former Smoker    Types: Cigars    Quit date: 12/24/1988    Years since quitting: 30.3  . Smokeless tobacco: Never Used  Substance and Sexual Activity  . Alcohol use: No  . Drug use: No  . Sexual activity: Yes    Partners: Female

## 2019-04-15 NOTE — Telephone Encounter (Signed)
Correction apparently the pt was given an order and was to have this lab work obtained at another office and has not gone to have this done. Please disregard the previous  message.

## 2019-04-17 ENCOUNTER — Other Ambulatory Visit: Payer: Self-pay

## 2019-04-17 ENCOUNTER — Encounter (HOSPITAL_COMMUNITY): Payer: Self-pay

## 2019-04-17 ENCOUNTER — Ambulatory Visit (HOSPITAL_COMMUNITY)
Admission: EM | Admit: 2019-04-17 | Discharge: 2019-04-17 | Disposition: A | Discharge status: Home / Self Care | Payer: Medicare Other | Attending: Emergency Medicine | Admitting: Emergency Medicine

## 2019-04-17 ENCOUNTER — Ambulatory Visit (INDEPENDENT_AMBULATORY_CARE_PROVIDER_SITE_OTHER): Payer: Medicare Other

## 2019-04-17 DIAGNOSIS — Z79890 Hormone replacement therapy (postmenopausal): Secondary | ICD-10-CM | POA: Diagnosis not present

## 2019-04-17 DIAGNOSIS — L97511 Non-pressure chronic ulcer of other part of right foot limited to breakdown of skin: Secondary | ICD-10-CM | POA: Diagnosis not present

## 2019-04-17 DIAGNOSIS — J9811 Atelectasis: Secondary | ICD-10-CM | POA: Diagnosis not present

## 2019-04-17 DIAGNOSIS — N186 End stage renal disease: Secondary | ICD-10-CM | POA: Insufficient documentation

## 2019-04-17 DIAGNOSIS — Z951 Presence of aortocoronary bypass graft: Secondary | ICD-10-CM | POA: Diagnosis not present

## 2019-04-17 DIAGNOSIS — R0602 Shortness of breath: Secondary | ICD-10-CM

## 2019-04-17 DIAGNOSIS — E11621 Type 2 diabetes mellitus with foot ulcer: Secondary | ICD-10-CM | POA: Diagnosis not present

## 2019-04-17 DIAGNOSIS — R05 Cough: Secondary | ICD-10-CM | POA: Insufficient documentation

## 2019-04-17 DIAGNOSIS — M4722 Other spondylosis with radiculopathy, cervical region: Secondary | ICD-10-CM | POA: Insufficient documentation

## 2019-04-17 DIAGNOSIS — J90 Pleural effusion, not elsewhere classified: Secondary | ICD-10-CM | POA: Insufficient documentation

## 2019-04-17 DIAGNOSIS — Z794 Long term (current) use of insulin: Secondary | ICD-10-CM | POA: Insufficient documentation

## 2019-04-17 DIAGNOSIS — Z8042 Family history of malignant neoplasm of prostate: Secondary | ICD-10-CM | POA: Insufficient documentation

## 2019-04-17 DIAGNOSIS — Z79899 Other long term (current) drug therapy: Secondary | ICD-10-CM | POA: Insufficient documentation

## 2019-04-17 DIAGNOSIS — I25810 Atherosclerosis of coronary artery bypass graft(s) without angina pectoris: Secondary | ICD-10-CM | POA: Insufficient documentation

## 2019-04-17 DIAGNOSIS — I120 Hypertensive chronic kidney disease with stage 5 chronic kidney disease or end stage renal disease: Secondary | ICD-10-CM | POA: Insufficient documentation

## 2019-04-17 DIAGNOSIS — M109 Gout, unspecified: Secondary | ICD-10-CM | POA: Insufficient documentation

## 2019-04-17 DIAGNOSIS — Z8249 Family history of ischemic heart disease and other diseases of the circulatory system: Secondary | ICD-10-CM | POA: Insufficient documentation

## 2019-04-17 DIAGNOSIS — E7800 Pure hypercholesterolemia, unspecified: Secondary | ICD-10-CM | POA: Diagnosis not present

## 2019-04-17 DIAGNOSIS — E039 Hypothyroidism, unspecified: Secondary | ICD-10-CM | POA: Insufficient documentation

## 2019-04-17 DIAGNOSIS — Z9049 Acquired absence of other specified parts of digestive tract: Secondary | ICD-10-CM | POA: Diagnosis not present

## 2019-04-17 DIAGNOSIS — Z89422 Acquired absence of other left toe(s): Secondary | ICD-10-CM | POA: Diagnosis not present

## 2019-04-17 DIAGNOSIS — Z809 Family history of malignant neoplasm, unspecified: Secondary | ICD-10-CM | POA: Insufficient documentation

## 2019-04-17 DIAGNOSIS — Z7982 Long term (current) use of aspirin: Secondary | ICD-10-CM | POA: Insufficient documentation

## 2019-04-17 DIAGNOSIS — Z89421 Acquired absence of other right toe(s): Secondary | ICD-10-CM | POA: Diagnosis not present

## 2019-04-17 DIAGNOSIS — Z87891 Personal history of nicotine dependence: Secondary | ICD-10-CM | POA: Insufficient documentation

## 2019-04-17 DIAGNOSIS — Z833 Family history of diabetes mellitus: Secondary | ICD-10-CM | POA: Insufficient documentation

## 2019-04-17 DIAGNOSIS — Z8546 Personal history of malignant neoplasm of prostate: Secondary | ICD-10-CM | POA: Diagnosis not present

## 2019-04-17 DIAGNOSIS — E1140 Type 2 diabetes mellitus with diabetic neuropathy, unspecified: Secondary | ICD-10-CM | POA: Diagnosis not present

## 2019-04-17 DIAGNOSIS — N529 Male erectile dysfunction, unspecified: Secondary | ICD-10-CM | POA: Diagnosis not present

## 2019-04-17 DIAGNOSIS — Z20822 Contact with and (suspected) exposure to covid-19: Secondary | ICD-10-CM | POA: Diagnosis not present

## 2019-04-17 DIAGNOSIS — E1122 Type 2 diabetes mellitus with diabetic chronic kidney disease: Secondary | ICD-10-CM | POA: Insufficient documentation

## 2019-04-17 DIAGNOSIS — R059 Cough, unspecified: Secondary | ICD-10-CM

## 2019-04-17 DIAGNOSIS — Z91018 Allergy to other foods: Secondary | ICD-10-CM | POA: Insufficient documentation

## 2019-04-17 LAB — POC SARS CORONAVIRUS 2 AG -  ED: SARS Coronavirus 2 Ag: NEGATIVE

## 2019-04-17 LAB — POC SARS CORONAVIRUS 2 AG: SARS Coronavirus 2 Ag: NEGATIVE

## 2019-04-17 MED ORDER — ALBUTEROL SULFATE HFA 108 (90 BASE) MCG/ACT IN AERS: 2.0000 | INHALATION_SPRAY | RESPIRATORY_TRACT | 0 refills | Status: DC | PRN

## 2019-04-17 MED ORDER — DOXYCYCLINE HYCLATE 100 MG PO CAPS: 100.0000 mg | ORAL_CAPSULE | Freq: Two times a day (BID) | ORAL | 0 refills | Status: DC

## 2019-04-17 MED ORDER — BENZONATATE 100 MG PO CAPS: 100.0000 mg | ORAL_CAPSULE | Freq: Three times a day (TID) | ORAL | 0 refills | Status: DC | PRN

## 2019-04-17 NOTE — ED Provider Notes (Signed)
[place]    CSN: [id number] Arrival date & time: 04/17/19  1111      History   Chief Complaint Chief Complaint  Patient presents with  . Cough  . Shortness of Breath    HPI [name] is a 69 y.o. male.   Patient here concerned with cough x 1 week.  PMH ESRD, DM II. Admits wheezing, SOB for same period of time.  He had negative COVID PCR test 11 days ago.       Past Medical History:  Diagnosis Date  . Cervical myelopathy ([place]) 02/06/2018  . Chronic kidney disease   . Coronary artery disease   . Diabetes mellitus without complication ([place])   . Diabetic foot ulcer ([place])   . Diabetic foot ulcer ([place])   . Diabetic neuropathy ([place]) 02/06/2018  . Gait abnormality 08/24/2018  . GERD (gastroesophageal reflux disease)   . History of kidney stones   . Hypercholesteremia   . Hypertension   . Hypothyroidism   . Neuropathy   . Prostate cancer [facility]     Patient Active Problem List   Diagnosis Date Noted  . Cutaneous abscess of right foot   . Osteomyelitis ([place]) 12/14/2018  . AKI (acute kidney injury) ([place]) 12/14/2018  . CKD (chronic kidney disease) stage 4, GFR 15-29 ml/min (HCC) 12/14/2018  . Gait abnormality 08/24/2018  . Diabetic neuropathy ([place]) 02/06/2018  . Cervical myelopathy ([place]) 02/06/2018  . Onychomycosis 10/30/2017  . Other spondylosis with radiculopathy, cervical region 01/27/2017  . Midfoot ulcer, left, limited to breakdown of skin ([place]) 11/15/2016  . Lateral epicondylitis, left elbow 08/12/2016  . Cellulitis of fifth toe of right foot 07/29/2016  . Prostate cancer ([place]) 06/19/2016  . Non-pressure chronic ulcer of other part of right foot limited to breakdown of skin ([place]) 04/04/2016  . Cellulitis of leg, right 12/24/2015  . Cellulitis of right lower extremity 12/24/2015  . CKD (chronic kidney disease), stage III 12/25/2013  . Foot infection 12/24/2013  . Foot ulcer, left ([place]) 12/24/2013  . Diabetic foot ulcer ([place]) 09/14/2012  . Gout  09/14/2012  . HTN (hypertension) 09/14/2012  . Hypothyroidism 09/14/2012  . CAD (coronary artery disease) 09/14/2012  . Diabetes mellitus ([place]) 09/14/2012  . Hypercholesteremia   . Neuropathy [facility])     Past Surgical History:  Procedure Laterality Date  . AMPUTATION Left 12/25/2013   Procedure: AMPUTATION RAY LEFT 5TH RAY;  Surgeon: [name], MD;  [facility];  Service: Orthopedics;  Laterality: Left;  . AMPUTATION Right 12/15/2018   Procedure: AMPUTATION OF 4TH AND 5TH TOES RIGHT FOOT;  Surgeon: [name], MD;  Location: [place];  Service: Orthopedics;  Laterality: Right;  . APPLICATION OF WOUND VAC Right 12/15/2018   Procedure: APPLICATION OF WOUND VAC;  Surgeon: [name], MD;  Location: [place];  Service: Orthopedics;  Laterality: Right;  . AV FISTULA PLACEMENT Left 03/25/2019   Procedure: LEFT ARM ARTERIOVENOUS (AV) FISTULA CREATION;  Surgeon: [name], MD;  Location: [place];  Service: Vascular;  Laterality: Left;  . BACK SURGERY    . CARDIAC CATHETERIZATION    . CHOLECYSTECTOMY    . CORONARY ARTERY BYPASS GRAFT    . I & D EXTREMITY Right 12/15/2018   Procedure: DEBRIDEMENT RIGHT FOOT;  Surgeon: [name], MD;  Location: [place];  Service: Orthopedics;  Laterality: Right;  . NECK SURGERY     novemver 2019       Home Medications  Prior to Admission medications   Medication Sig Start Date End Date Taking? Authorizing Provider  acetaminophen (TYLENOL) 500 MG tablet Take 1,500 mg by mouth every 6 (six) hours as needed (pain).    [provider]  albuterol (VENTOLIN HFA) 108 (90 Base) MCG/ACT inhaler Inhale 2 puffs into the lungs every 4 (four) hours as needed for wheezing or shortness of breath. 04/17/19   Peri Jefferson, PA-C  allopurinol (ZYLOPRIM) 100 MG tablet Take 200 mg by mouth every morning.  05/04/18   [provider]  Alprostadil, Vasodilator, (CAVERJECT IC) 1 Dose by Intracavernosal route daily as needed (erectile dysfunction).     [provider]  aspirin EC 81 MG tablet Take 81 mg by mouth every morning.     [provider]  benzonatate (TESSALON PERLES) 100 MG capsule Take 1 capsule (100 mg total) by mouth 3 (three) times daily as needed for cough. 04/17/19   Peri Jefferson, PA-C  calcitRIOL (ROCALTROL) 0.25 MCG capsule Take 0.25 mcg by mouth every morning.  03/22/18   [provider]  carvedilol (COREG) 25 MG tablet Take 25 mg by mouth 2 (two) times daily.  11/22/16   [provider]  diclofenac sodium (VOLTAREN) 1 % GEL Apply 1 application topically 3 (three) times daily as needed (pain).     [provider]  docusate sodium (STOOL SOFTENER) 100 MG capsule Take 100 mg by mouth 2 (two) times daily.    [provider]  doxycycline (VIBRAMYCIN) 100 MG capsule Take 1 capsule (100 mg total) by mouth 2 (two) times daily. 04/17/19   Peri Jefferson, PA-C  DULoxetine (CYMBALTA) 60 MG capsule Take 1 capsule (60 mg total) by mouth daily. 02/18/19   Kathrynn Ducking, MD  fenofibrate 160 MG tablet Take 160 mg by mouth at bedtime. 10/26/18   [provider]  Insulin Degludec (TRESIBA FLEXTOUCH) 200 UNIT/ML SOPN Inject 40 Units into the skin at bedtime. Patient taking differently: Inject 20 Units into the skin at bedtime.  12/27/15   Domenic Polite, MD  levothyroxine (SYNTHROID, LEVOTHROID) 112 MCG tablet Take 112 mcg by mouth daily before breakfast. BRAND NAME SYNTHROID    [provider]  LYRICA 150 MG capsule Take 1 capsule (150 mg total) by mouth at bedtime. Patient taking differently: Take 150 mg by mouth 2 (two) times daily.  02/06/18   Kathrynn Ducking, MD  nitroGLYCERIN (NITRODUR - DOSED IN MG/24 HR) 0.2 mg/hr patch Place 1 patch (0.2 mg total) onto the skin daily. 12/22/18   [name], MD  OVER THE COUNTER MEDICATION Take 1 tablet by mouth 3 (three) times daily as needed (leg cramps). Hyland's Leg Cramps    [provider]  oxyCODONE-acetaminophen  (PERCOCET/ROXICET) 5-325 MG tablet Take 1 tablet by mouth every 6 (six) hours as needed. 03/25/19   Ulyses Amor, PA-C  pentoxifylline (TRENTAL) 400 MG CR tablet TAKE 1 TABLET (400 MG TOTAL) BY MOUTH 3 (THREE) TIMES DAILY WITH MEALS. 03/26/19   [name], MD  rosuvastatin (CRESTOR) 10 MG tablet Take 10 mg by mouth at bedtime.     [provider]  Semaglutide, 1 MG/DOSE, (OZEMPIC, 1 MG/DOSE,) 2 MG/1.5ML SOPN Inject 0.5 mg into the skin every Friday.     [provider]  triamcinolone (KENALOG) 0.025 % cream Apply 1 application topically daily as needed (psoriasis).    [provider]    Family History Family History  Problem Relation Age of Onset  . Diabetes Mellitus  II Mother   . Diabetes Mellitus II Father   . CAD Father   . Cancer Father        prostate  . Diabetes Mellitus II Brother   . Diabetes Mellitus II Brother   . Cancer Brother     Social History Social History   Tobacco Use  . Smoking status: Former Smoker    Types: Cigars    Quit date: 12/24/1988    Years since quitting: 30.3  . Smokeless tobacco: Never Used  Substance Use Topics  . Alcohol use: No  . Drug use: No     Allergies   Mushroom extract complex   Review of Systems Review of Systems  Constitutional: Negative for chills and fever.  HENT: Negative for ear pain, rhinorrhea, sinus pressure, sinus pain and sore throat.        No loss of taste or smell  Eyes: Negative for pain and visual disturbance.  Respiratory: Positive for cough, shortness of breath and wheezing.   Cardiovascular: Negative for chest pain and palpitations.  Gastrointestinal: Negative for abdominal pain, diarrhea, nausea and vomiting.  Musculoskeletal: Negative for arthralgias, back pain and myalgias.  Skin: Negative for color change and rash.  Neurological: Negative for seizures and syncope.  Psychiatric/Behavioral: Positive for sleep disturbance.  All other systems reviewed and are  negative.    Physical Exam Triage Vital Signs ED Triage Vitals  Enc Vitals Group     BP 04/17/19 1145 (!) 154/67     Pulse Rate 04/17/19 1145 73     Resp 04/17/19 1145 20     Temp 04/17/19 1145 98.1 F (36.7 C)     Temp Source 04/17/19 1145 Oral     SpO2 04/17/19 1145 96 %     Weight --      Height --      Head Circumference --      Peak Flow --      Pain Score 04/17/19 1141 0     Pain Loc --      Pain Edu? --      Excl. in [place]? --    No data found.  Updated Vital Signs BP (!) 154/67 (BP Location: Left Arm)   Pulse 73   Temp 98.1 F (36.7 C) (Oral)   Resp 20   SpO2 96%   Visual Acuity Right Eye Distance:   Left Eye Distance:   Bilateral Distance:    Right Eye Near:   Left Eye Near:    Bilateral Near:     Physical Exam Vitals and nursing note reviewed.  Constitutional:      Appearance: He is well-developed.  HENT:     Head: Normocephalic and atraumatic.  Eyes:     Conjunctiva/sclera: Conjunctivae normal.     Pupils: Pupils are equal, round, and reactive to light.  Cardiovascular:     Rate and Rhythm: Normal rate and regular rhythm.     Heart sounds: No murmur.  Pulmonary:     Effort: Pulmonary effort is normal. No respiratory distress.     Breath sounds: Normal breath sounds. No decreased breath sounds, wheezing, rhonchi or rales.  Abdominal:     Palpations: Abdomen is soft.     Tenderness: There is no abdominal tenderness.  Musculoskeletal:     Cervical back: Neck supple.  Skin:    General: Skin is warm and dry.  Neurological:     Mental Status: He is alert.      UC Treatments / Results  Labs (all labs ordered are listed, but only abnormal results are displayed) Labs Reviewed  POC SARS CORONAVIRUS 2 AG -  ED  POC SARS CORONAVIRUS 2 AG    EKG   Radiology DG Chest 2 View  Result Date: 04/17/2019 CLINICAL DATA:  Cough and shortness of breath EXAM: CHEST - 2 VIEW COMPARISON:  December 14, 2018 FINDINGS: There are small pleural effusions  bilaterally with slight bibasilar atelectasis. No edema or consolidation. Heart is upper normal in size with pulmonary vascularity normal. No adenopathy. Patient is status post median sternotomy. There is postoperative change in the lower cervical region IMPRESSION: Small pleural effusions bilaterally with bibasilar atelectasis. Lungs elsewhere clear. Heart upper normal in size. Postoperative changes noted. No evident adenopathy. Electronically Signed   By: Lowella Grip III M.D.   On: 04/17/2019 12:22    Procedures Procedures (including critical care time)  Medications Ordered in UC Medications - No data to display  Initial Impression / Assessment and Plan / UC Course  I have reviewed the triage vital signs and the nursing notes.  Pertinent labs & imaging results that were available during my care of the patient were reviewed by me and considered in my medical decision making (see chart for details).     Xray with small b/l pleural effusion, due to [place] DM, will start on doxycycline and recommend close follow up with PCP. ED precautions given. Final Clinical Impressions(s) / UC Diagnoses   Final diagnoses:  Cough  Shortness of breath     Discharge Instructions     Take medication as prescribed. Follow up with PCP on Monday Go to ER with worsening symptoms or if symptoms fail to improve.    ED Prescriptions    Medication Sig Dispense Auth. Provider   albuterol (VENTOLIN HFA) 108 (90 Base) MCG/ACT inhaler Inhale 2 puffs into the lungs every 4 (four) hours as needed for wheezing or shortness of breath. 18 g Peri Jefferson, PA-C   doxycycline (VIBRAMYCIN) 100 MG capsule Take 1 capsule (100 mg total) by mouth 2 (two) times daily. 20 capsule Peri Jefferson, PA-C   benzonatate (TESSALON PERLES) 100 MG capsule Take 1 capsule (100 mg total) by mouth 3 (three) times daily as needed for cough. 20 capsule Peri Jefferson, PA-C     PDMP not reviewed this encounter.   Peri Jefferson, PA-C 04/17/19 1252

## 2019-04-17 NOTE — Discharge Instructions (Signed)
Take medication as prescribed. Follow up with PCP on Monday Go to ER with worsening symptoms or if symptoms fail to improve.

## 2019-04-17 NOTE — ED Triage Notes (Addendum)
Pt presents to UC with cough, worse at nigh x 1 week; mild SOB with exertion x 1 week, and chest congestion x 1 week. Pt reports he had a negative COVID test 1 week ago.

## 2019-04-18 LAB — NOVEL CORONAVIRUS, NAA (HOSP ORDER, SEND-OUT TO REF LAB; TAT 18-24 HRS): SARS-CoV-2, NAA: NOT DETECTED

## 2019-05-03 ENCOUNTER — Ambulatory Visit (INDEPENDENT_AMBULATORY_CARE_PROVIDER_SITE_OTHER): Payer: Medicare Other | Admitting: Cardiology

## 2019-05-03 ENCOUNTER — Encounter: Payer: Self-pay | Admitting: Cardiology

## 2019-05-03 ENCOUNTER — Other Ambulatory Visit: Payer: Self-pay

## 2019-05-03 VITALS — BP 117/65 | HR 72 | Temp 97.9°F | Ht 70.0 in | Wt 198.7 lb

## 2019-05-03 DIAGNOSIS — Z951 Presence of aortocoronary bypass graft: Secondary | ICD-10-CM

## 2019-05-03 DIAGNOSIS — I120 Hypertensive chronic kidney disease with stage 5 chronic kidney disease or end stage renal disease: Secondary | ICD-10-CM

## 2019-05-03 DIAGNOSIS — I2510 Atherosclerotic heart disease of native coronary artery without angina pectoris: Secondary | ICD-10-CM

## 2019-05-03 DIAGNOSIS — E1122 Type 2 diabetes mellitus with diabetic chronic kidney disease: Secondary | ICD-10-CM

## 2019-05-03 DIAGNOSIS — Z794 Long term (current) use of insulin: Secondary | ICD-10-CM

## 2019-05-03 DIAGNOSIS — E7800 Pure hypercholesterolemia, unspecified: Secondary | ICD-10-CM

## 2019-05-03 DIAGNOSIS — I6523 Occlusion and stenosis of bilateral carotid arteries: Secondary | ICD-10-CM

## 2019-05-03 DIAGNOSIS — N185 Chronic kidney disease, stage 5: Secondary | ICD-10-CM

## 2019-05-03 DIAGNOSIS — I10 Essential (primary) hypertension: Secondary | ICD-10-CM

## 2019-05-03 NOTE — Progress Notes (Signed)
Primary Physician/Referring:  Fanny Bien, MD  Patient ID: [name], male    DOB: [birth year], 69 y.o.   MRN: [id number]  Chief Complaint  Patient presents with  . Coronary Artery Disease  . Follow-up    1 year   HPI:    [name]  is a 69 y.o.  Caucasian male with CABG in [place] 02/18/2004: RIMA to LAD, LIMA to obtuse marginal, SVG to diagonal and SVG to PDA, who has diabetes mellitus, diabetic nephropathy. stage 3-4 CKD, peripheral neuropathy and diabetic foot ulcer right.   DM is controlled. He also has chronic bilateral leg edema and positive venous insufficiency of left leg by insufficiency study in Sept 2019. He has no known PAD. He has had amputation of his left 2 toes due to diabetic foot ulcer in the past. He now has diabetic bilateral foot ulcers in the sole of the foot and follows Dr. [name]. He is also presently in the wound care clinic.  He now has developed advanced renal failure and needs dialysis.  He was trying to postpone this but now is aware as he feels very poorly, lack of appetite, weight loss, dizziness, he wants to proceed with having AV shunt placed and also willing to proceed with having a dialysis catheter placed temporarily.  Wife present.  Past Medical History:  Diagnosis Date  . Cervical myelopathy ([place]) 02/06/2018  . Chronic kidney disease   . Coronary artery disease   . Diabetes mellitus without complication ([place])   . Diabetic foot ulcer ([place])   . Diabetic foot ulcer ([place])   . Diabetic neuropathy ([place]) 02/06/2018  . Gait abnormality 08/24/2018  . GERD (gastroesophageal reflux disease)   . History of kidney stones   . Hypercholesteremia   . Hypertension   . Hypothyroidism   . Neuropathy   . Prostate cancer [facility])    Past Surgical History:  Procedure Laterality Date  . AMPUTATION Left 12/25/2013   Procedure: AMPUTATION RAY LEFT 5TH RAY;  Surgeon: [name], MD;  Location: [facility];  Service: Orthopedics;  Laterality: Left;  . AMPUTATION  Right 12/15/2018   Procedure: AMPUTATION OF 4TH AND 5TH TOES RIGHT FOOT;  Surgeon: [name], MD;  Location: [place];  Service: Orthopedics;  Laterality: Right;  . APPLICATION OF WOUND VAC Right 12/15/2018   Procedure: APPLICATION OF WOUND VAC;  Surgeon: [name], MD;  Location: [place];  Service: Orthopedics;  Laterality: Right;  . AV FISTULA PLACEMENT Left 03/25/2019   Procedure: LEFT ARM ARTERIOVENOUS (AV) FISTULA CREATION;  Surgeon: [name], MD;  Location: [place];  Service: Vascular;  Laterality: Left;  . BACK SURGERY    . CARDIAC CATHETERIZATION    . CHOLECYSTECTOMY    . CORONARY ARTERY BYPASS GRAFT    . I & D EXTREMITY Right 12/15/2018   Procedure: DEBRIDEMENT RIGHT FOOT;  Surgeon: [name], MD;  Location: [place];  Service: Orthopedics;  Laterality: Right;  . NECK SURGERY     novemver 2019   Social History   Tobacco Use  . Smoking status: Former Smoker    Types: Cigars    Quit date: 12/24/1988    Years since quitting: 30.3  . Smokeless tobacco: Never Used  Substance Use Topics  . Alcohol use: No    ROS  Review of Systems  Constitution: Positive for malaise/fatigue and weight loss. Negative for weight gain.  Cardiovascular: Negative for dyspnea on exertion, leg swelling and syncope.  Respiratory: Negative  for hemoptysis.   Endocrine: Negative for cold intolerance.  Hematologic/Lymphatic: Does not bruise/bleed easily.  Skin:       Right foot ulcer (not examined) and right shin skin tear after a recent trauma.  Gastrointestinal: Negative for hematochezia and melena.  Neurological: Positive for light-headedness and paresthesias. Negative for headaches.   Objective  Blood pressure 117/65, pulse 72, temperature 97.9 F (36.6 C), height 5\' 10"  (1.778 m), weight 198 lb 11.2 oz (90.1 kg), SpO2 99 %.  Vitals with BMI 05/03/2019 04/17/2019 03/25/2019  Height 5\' 10"  - -  Weight 198 lbs 11 oz - -  BMI 16.01 - -  Systolic 093 235 -  Diastolic 65 67 -  Pulse 72 73 73       Physical Exam  Constitutional:  Ill looking  HENT:  Head: Atraumatic.  Eyes: Conjunctivae are normal.  Neck: No JVD present. No thyromegaly present.  Cardiovascular: Normal rate and regular rhythm. Exam reveals no gallop.  No murmur (2/6 early systolic murmur at the apex) heard. Pulses:      Carotid pulses are 2+ on the right side and 2+ on the left side.      Femoral pulses are 2+ on the right side and 2+ on the left side.      Popliteal pulses are 2+ on the right side and 2+ on the left side.       Dorsalis pedis pulses are 1+ on the right side and 1+ on the left side.       Posterior tibial pulses are 1+ on the right side and 1+ on the left side.  Has ulcers on right  Feet healing and followed by Dr. [name] and wound care clinic (not examined today).  Right shin has a superficial ulceration after recent trauma.   Thick rigid nails and onychomycosis (chronic).   Pulmonary/Chest: Effort normal and breath sounds normal.  Abdominal: Soft. Bowel sounds are normal.  Musculoskeletal:        General: No edema. Normal range of motion.     Cervical back: Neck supple.  Neurological: He is alert.  Skin: Skin is warm and dry.  Psychiatric: He has a normal mood and affect.   Laboratory examination:   Recent Labs    12/16/18 0248 12/16/18 0248 12/17/18 0250 12/18/18 0243 03/25/19 0625  NA 136   < > 134* 137 142  K 4.3   < > 4.2 4.6 4.8  CL 109   < > 107 109 112*  CO2 14*  --  16* 17*  --   GLUCOSE 144*   < > 156* 120* 114*  BUN 80*   < > 90* 98* 74*  CREATININE 7.06*   < > 7.14* 6.99* 4.60*  CALCIUM 7.4*  --  7.5* 7.8*  --   GFRNONAA 7*  --  7* 7*  --   GFRAA 8*  --  8* 8*  --    < > = values in this interval not displayed.   CrCl cannot be calculated (Patient's most recent lab result is older than the maximum 21 days allowed.).  CMP Latest Ref Rng & Units 03/25/2019 12/18/2018 12/17/2018  Glucose 70 - 99 mg/dL 114(H) 120(H) 156(H)  BUN 8 - 23 mg/dL 74(H) 98(H) 90(H)   Creatinine 0.61 - 1.24 mg/dL 4.60(H) 6.99(H) 7.14(H)  Sodium 135 - 145 mmol/L 142 137 134(L)  Potassium 3.5 - 5.1 mmol/L 4.8 4.6 4.2  Chloride 98 - 111 mmol/L 112(H) 109 107  CO2 22 -  32 mmol/L - 17(L) 16(L)  Calcium 8.9 - 10.3 mg/dL - 7.8(L) 7.5(L)  Total Protein 6.5 - 8.1 g/dL - - -  Total Bilirubin 0.3 - 1.2 mg/dL - - -  Alkaline Phos 38 - 126 U/L - - -  AST 15 - 41 U/L - - -  ALT 0 - 44 U/L - - -   CBC Latest Ref Rng & Units 03/25/2019 12/18/2018 12/17/2018  WBC 4.0 - 10.5 K/uL - 5.1 5.1  Hemoglobin 13.0 - 17.0 g/dL 9.2(L) 7.4(L) 7.2(L)  Hematocrit 39.0 - 52.0 % 27.0(L) 23.3(L) 22.7(L)  Platelets 150 - 400 K/uL - 170 159   Lipid Panel  No results found for: CHOL, TRIG, HDL, CHOLHDL, VLDL, LDLCALC, LDLDIRECT HEMOGLOBIN A1C Lab Results  Component Value Date   HGBA1C 6.3 (H) 12/14/2018   MPG 134.11 12/14/2018   TSH No results for input(s): TSH in the last 8760 hours.  External labs:  Cholesterol, total 148.000 M 11/27/2018 HDL 37.000 M 11/27/2018 LDL N/D Triglycerides 223.000 M 11/27/2018 A1C 6.300 12/14/2018 Hemoglobin 9.200 03/25/2019 Creatinine, Serum 4.600 03/25/2019 Potassium 4.800 03/25/2019 Magnesium N/D ALT (SGPT) 14.000 IU/ 01/29/2019 TSH 2.030 10/12/2018 Platelets 178.000 X 03/24/2019  Medications and allergies   Allergies  Allergen Reactions  . Mushroom Extract Complex Nausea Only     Current Outpatient Medications  Medication Instructions  . acetaminophen (TYLENOL) 1,500 mg, Oral, Every 6 hours PRN  . albuterol (VENTOLIN HFA) 108 (90 Base) MCG/ACT inhaler 2 puffs, Inhalation, Every 4 hours PRN  . allopurinol (ZYLOPRIM) 200 mg, Oral,  Every morning - 10a  . Alprostadil, Vasodilator, (CAVERJECT IC) 1 Dose, Intracavernosal, Daily PRN  . aspirin EC 81 mg, Oral,  Every morning - 10a  . calcitRIOL (ROCALTROL) 0.25 mcg, Oral,  Every morning - 10a  . carvedilol (COREG) 25 mg, Oral, 2 times daily  . DULoxetine (CYMBALTA) 60 mg, Oral, Daily  . Insulin Degludec  (TRESIBA FLEXTOUCH) 40 Units, Subcutaneous, Daily at bedtime  . levothyroxine (SYNTHROID) 112 mcg, Oral, Daily before breakfast, BRAND NAME SYNTHROID  . Lyrica 150 mg, Oral, Daily at bedtime  . nitroGLYCERIN (NITRODUR - DOSED IN MG/24 HR) 0.2 mg, Transdermal, Daily  . OVER THE COUNTER MEDICATION 1 tablet, Oral, 3 times daily PRN, Hyland's Leg Cramps  . Ozempic (1 MG/DOSE) 0.5 mg, Subcutaneous, Every Fri  . pentoxifylline (TRENTAL) 400 mg, Oral, 3 times daily with meals  . rosuvastatin (CRESTOR) 10 mg, Oral, Daily at bedtime  . triamcinolone (KENALOG) 4.627 % cream 1 application, Topical, Daily PRN    Radiology:  No results found.  Cardiac Studies:   Echo- 06/08/13 1. Left ventricular cavity is normal in size. Mild concentric hypertrophy. Normal global wall motion. Normal systolic global function. Calculated EF 57%. Visual EF is 60-65%. 2. Left atrial cavity is mildly dilated. Mild aneurysmal motion of the interatrial septum. 3. Mild aortic valve leaflet calcification. 4. Mild calcification of the mitral annulus. Mild mitral regurgitation. 5. Tricuspid valve structurally normal. Mild tricuspid regurgitation.  Lexiscan sestamibi stress test 05/28/2013: 1. Resting EKG NSR, Poor R wave progression. Low voltage complexes. Stress EKG was non diagnostic for ischemia. No ST-T changes of ischemia noted with pharmacologic stress testing.  2. The perfusion study demonstrated normal isotope uptake both at rest and stress. There was no evidence of ischemia or scar. Dynamic gated images reveal normal wall motion and endocardial thickening. Left ventricular ejection fraction was estimated to be 67%.  Lower extremity arterial duplex 05/12/2014:  No hemodynamically significant stenoses are identified in the  bilateral lower extremity arterial system.This exam reveals normal perfusion of both the lower extremities with RABI 1.04 and LABI 1.09. There is mild diffuse disease involving the small vessels below the  knee. Compared to the study done on 06/08/2013, no significant change. Impression: Lower extremity venous insufficiency study 12/25/2016: No venous insufficiency or DVT in the right lower extremity. Reflexes in the left greater saphenous vein beginning in the distal thigh and involving the entire length of the calf. No obvious varicosities.  Carotid artery duplex 01/27/2018: Stenosis in the bilateral internal carotid artery (16-49%), lower end of spectrum with heteregenous plaque. Right external carotid stenosis <50%. Antegrade right vertebral artery flow. Antegrade left vertebral artery flow. Follow up in one year is appropriate if clinically indicated. Compared to 12/04/2016, no significant change.  ABI 12/16/2018: Right: Resting right ankle-brachial index is within normal range. No evidence of significant right lower extremity arterial disease. Left: Resting left ankle-brachial index indicates noncompressible left lower extremity arteries.  Assessment     ICD-10-CM   1. Coronary artery disease involving native coronary artery of native heart without angina pectoris  I25.10 EKG 12-Lead  2. S/P CABG x 4:  02/18/2004: RIMA to LAD, LIMA to OM-1, SVG to D1 and SVG to PDA.  Z95.1   3. Controlled type 2 diabetes mellitus with stage 5 chronic kidney disease not on chronic dialysis, with long-term current use of insulin (HCC)  E11.22    N18.5    Z79.4   4. Essential hypertension  I10   5. Hypercholesteremia  E78.00    EKG 05/03/2019: Sinus rhythm with borderline first-degree AV block, normal axis, incomplete right bundle branch block.  Poor R progression, probably normal variant.  No evidence of ischemia.No significant change from EKG 05/18/2018   No orders of the defined types were placed in this encounter.   Medications Discontinued During This Encounter  Medication Reason  . benzonatate (TESSALON PERLES) 100 MG capsule Error  . diclofenac sodium (VOLTAREN) 1 % GEL Error  . docusate sodium  (STOOL SOFTENER) 100 MG capsule Error  . doxycycline (VIBRAMYCIN) 100 MG capsule Error  . fenofibrate 160 MG tablet Error  . oxyCODONE-acetaminophen (PERCOCET/ROXICET) 5-325 MG tablet Error    Recommendations:   Nevan Creighton  is a 69 y.o. Caucasian male with CABG in [place] 02/18/2004: RIMA to LAD, LIMA to obtuse marginal, SVG to diagonal and SVG to PDA, who has diabetes mellitus, diabetic nephropathy. stage 3-4 CKD, peripheral neuropathy and diabetic foot ulcer right.  Patient is now in end stage renal disease, he is awaiting dialysis.  Will discuss with vascular surgery to go ahead and place AV shunt.  He has carotid artery duplex scheduled for surveillance next week which he will keep.  He is profoundly weak, very unsteady on his gait, will reduce the dose of carvedilol from 25 mg of 12.5 mg twice daily and will try to have permissive hypertension for now.  Diabetes continues to be controlled, lipids are also controlled, I reviewed his external labs.  I will see him back in 2 months for follow-up.  He is presently in wound care clinic for slow healing right foot ulcer, he also has developed superficial ulceration after trauma 2 days ago.  No evidence of infection. I have presonally reached out to Dr. Trula Slade regarding proponing his visit if possible although not an emergency.   I spent 40 minutes in review of external records, coordination of care, discussion with him and his wife about his complex medical issues.  Adrian Prows, MD, [name] 05/03/2019, 9:07 AM [facility] [place] Office: [phone]  CC: Madelon Lips, MD (Nephro); CC: Annamarie Major, MD (Vasc)

## 2019-05-04 ENCOUNTER — Other Ambulatory Visit: Payer: Self-pay | Admitting: *Deleted

## 2019-05-04 ENCOUNTER — Other Ambulatory Visit: Payer: Self-pay

## 2019-05-04 ENCOUNTER — Telehealth: Payer: Self-pay | Admitting: *Deleted

## 2019-05-04 ENCOUNTER — Encounter (HOSPITAL_BASED_OUTPATIENT_CLINIC_OR_DEPARTMENT_OTHER): Payer: Medicare Other | Attending: Internal Medicine | Admitting: Internal Medicine

## 2019-05-04 DIAGNOSIS — E1122 Type 2 diabetes mellitus with diabetic chronic kidney disease: Secondary | ICD-10-CM | POA: Diagnosis not present

## 2019-05-04 DIAGNOSIS — E11621 Type 2 diabetes mellitus with foot ulcer: Secondary | ICD-10-CM | POA: Insufficient documentation

## 2019-05-04 DIAGNOSIS — Z951 Presence of aortocoronary bypass graft: Secondary | ICD-10-CM | POA: Diagnosis not present

## 2019-05-04 DIAGNOSIS — E1151 Type 2 diabetes mellitus with diabetic peripheral angiopathy without gangrene: Secondary | ICD-10-CM | POA: Diagnosis not present

## 2019-05-04 DIAGNOSIS — Z87891 Personal history of nicotine dependence: Secondary | ICD-10-CM | POA: Diagnosis not present

## 2019-05-04 DIAGNOSIS — L97511 Non-pressure chronic ulcer of other part of right foot limited to breakdown of skin: Secondary | ICD-10-CM | POA: Diagnosis not present

## 2019-05-04 DIAGNOSIS — Z89431 Acquired absence of right foot: Secondary | ICD-10-CM | POA: Insufficient documentation

## 2019-05-04 DIAGNOSIS — I120 Hypertensive chronic kidney disease with stage 5 chronic kidney disease or end stage renal disease: Secondary | ICD-10-CM | POA: Insufficient documentation

## 2019-05-04 DIAGNOSIS — Z992 Dependence on renal dialysis: Secondary | ICD-10-CM | POA: Insufficient documentation

## 2019-05-04 DIAGNOSIS — N186 End stage renal disease: Secondary | ICD-10-CM | POA: Diagnosis not present

## 2019-05-04 DIAGNOSIS — E1142 Type 2 diabetes mellitus with diabetic polyneuropathy: Secondary | ICD-10-CM | POA: Diagnosis not present

## 2019-05-04 DIAGNOSIS — W19XXXA Unspecified fall, initial encounter: Secondary | ICD-10-CM | POA: Insufficient documentation

## 2019-05-04 DIAGNOSIS — S81811A Laceration without foreign body, right lower leg, initial encounter: Secondary | ICD-10-CM | POA: Diagnosis not present

## 2019-05-04 DIAGNOSIS — Z8546 Personal history of malignant neoplasm of prostate: Secondary | ICD-10-CM | POA: Insufficient documentation

## 2019-05-04 DIAGNOSIS — I2510 Atherosclerotic heart disease of native coronary artery without angina pectoris: Secondary | ICD-10-CM | POA: Insufficient documentation

## 2019-05-04 DIAGNOSIS — Z923 Personal history of irradiation: Secondary | ICD-10-CM | POA: Diagnosis not present

## 2019-05-04 NOTE — Telephone Encounter (Signed)
All information as in previous note from [facility]  Given to son Broadus John.

## 2019-05-04 NOTE — Telephone Encounter (Signed)
I have spoke with [name] multiple times today. He is confused on what is going to be done and when. I called Dr. Hollie Salk. Patient is scheduled for [facility] placement at [facility] [place] today 05/04/2019.  Patient instructed to be here at the office on Monday 05/10/2019 at 9:30 for Vascular labs and the see PA to determine when to schedule Second stage of surgery for access in his arm. He is reminded to call Dr. Bishop [place] office for any questions about starting dialysis.

## 2019-05-04 NOTE — Progress Notes (Signed)
Jon, [name] ([id number]) Visit Report for 05/04/2019 Abuse/Suicide Risk Screen Details Patient Name: Date of Service: [name], [name] 05/04/2019 9:00 AM Medical Record [id number] Patient Account Number: [id number] Date of Birth/Sex: Treating RN: 11-Jun-1950 (69 y.o. [name] Primary Care Vernica Wachtel: Fanny Bien Other Clinician: Referring Adara Kittle: Treating Prezley Qadir/Extender:Robson, Gennette Pac, Rojelio Brenner in Treatment: 0 Abuse/Suicide Risk Screen Items Answer ABUSE RISK SCREEN: Has anyone close to you tried to hurt or harm you recentlyo No Do you feel uncomfortable with anyone in your familyo No Has anyone forced you do things that you didnt want to doo No Electronic Signature(s) Signed: 05/04/2019 6:19:30 PM By: Baruch Gouty RN, BSN Entered By: Baruch Gouty on 05/04/2019 09:44:16 -------------------------------------------------------------------------------- Activities of Daily Living Details Patient Name: Date of Service: [name] 05/04/2019 9:00 AM Medical Record [id number] Patient Account Number: [id number] Date of Birth/Sex: Treating RN: 02-07-1951 (69 y.o. [name] Primary Care Johnatan Baskette: Fanny Bien Other Clinician: Referring Ryann Pauli: Treating Meital Riehl/Extender:Robson, Gennette Pac, Rojelio Brenner in Treatment: 0 Activities of Daily Living Items Answer Activities of Daily Living (Please select one for each item) Drive Automobile Completely Able Take Medications Completely Able Use Telephone Completely Able Care for Appearance Completely Able Use Toilet Completely Able Bath / Shower Completely Able Dress Self Completely Able Feed Self Completely Able Walk Completely Able Get In / Out Bed Completely Able Housework Completely Able Prepare Meals Completely Able Handle Money Completely Able Shop for Self Completely Able Electronic Signature(s) Signed: 05/04/2019 6:19:30 PM By: Baruch Gouty RN, BSN Entered  By: Baruch Gouty on 05/04/2019 09:45:01 -------------------------------------------------------------------------------- Education Screening Details Patient Name: Date of Service: [name], [name] 05/04/2019 9:00 AM Medical Record [id number] Patient Account Number: [id number] Date of Birth/Sex: Treating RN: 1950/11/01 (68 y.o. [name] Primary Care Tewana Bohlen: Fanny Bien Other Clinician: Referring Sherylann Vangorden: Treating Elleen Coulibaly/Extender:Robson, Gennette Pac, Rojelio Brenner in Treatment: 0 Primary Learner Assessed: Patient Learning Preferences/Education Level/Primary Language Learning Preference: Explanation, Demonstration, Printed Material Highest Education Level: College or Above Preferred Language: English Cognitive Barrier Language Barrier: No Translator Needed: No Memory Deficit: No Emotional Barrier: No Cultural/Religious Beliefs Affecting Medical Care: No Physical Barrier Impaired Vision: Yes Contacts Impaired Hearing: No Decreased Hand dexterity: No Knowledge/Comprehension Knowledge Level: High Comprehension Level: High Ability to understand written High instructions: Ability to understand verbal High instructions: Motivation Anxiety Level: Calm Cooperation: Cooperative Education Importance: Acknowledges Need Interest in Health Problems: Asks Questions Perception: Coherent Willingness to Engage in Self- High Management Activities: Readiness to Engage in Self- High Management Activities: Electronic Signature(s) Signed: 05/04/2019 6:19:30 PM By: Baruch Gouty RN, BSN Entered By: Baruch Gouty on 05/04/2019 09:45:28 -------------------------------------------------------------------------------- Fall Risk Assessment Details Patient Name: Date of Service: [name], [name] 05/04/2019 9:00 AM Medical Record [id number] Patient Account Number: [id number] Date of Birth/Sex: Treating RN: November 23, 1950 (69 y.o. [name] Primary Care  Reynolds Kittel: Fanny Bien Other Clinician: Referring Derrich Gaby: Treating Kannan Proia/Extender:Robson, Gennette Pac, Rojelio Brenner in Treatment: 0 Fall Risk Assessment Items Have you had 2 or more falls in the last 12 monthso 0 Yes Have you had any fall that resulted in injury in the last 12 monthso 0 Yes FALLS RISK SCREEN History of falling - immediate or within 3 months 25 Yes Secondary diagnosis (Do you have 2 or more medical diagnoseso) 0 No Ambulatory aid None/bed rest/wheelchair/nurse 0 No Crutches/cane/walker 15 Yes Furniture 0 No Intravenous therapy Access/Saline/Heparin Lock 0 No Weak (short steps with or without shuffle, stooped but able to lift head 10 Yes while walking, may seek  support from furniture) Impaired (short steps with shuffle, may have difficulty arising from chair, 0 No head down, impaired balance) Mental Status Oriented to own ability 0 Yes Overestimates or forgets limitations 0 No Risk Level: Medium Risk Score: 50 Electronic Signature(s) Signed: 05/04/2019 6:19:30 PM By: Baruch Gouty RN, BSN Entered By: Baruch Gouty on 05/04/2019 09:45:54 -------------------------------------------------------------------------------- Foot Assessment Details Patient Name: Date of Service: [name], [name] 05/04/2019 9:00 AM Medical Record [id number] Patient Account Number: [id number] Date of Birth/Sex: Treating RN: [birth year] (69 y.o. [name] Primary Care Andray Assefa: Fanny Bien Other Clinician: Referring Ceasia Elwell: Treating Harlean Regula/Extender:Robson, Gennette Pac, Rojelio Brenner in Treatment: 0 Foot Assessment Items Site Locations + = Sensation present, - = Sensation absent, C = Callus, U = Ulcer R = Redness, W = Warmth, M = Maceration, PU = Pre-ulcerative lesion F = Fissure, S = Swelling, D = Dryness Assessment Right: Left: Other Deformity: Yes Yes Prior Foot Ulcer: Yes Yes Prior Amputation: Yes Yes Charcot Joint: No No Ambulatory  Status: Ambulatory With Help Assistance Device: Cane Gait: Steady Electronic Signature(s) Signed: 05/04/2019 6:19:30 PM By: Baruch Gouty RN, BSN Entered By: Baruch Gouty on 05/04/2019 09:48:56 -------------------------------------------------------------------------------- Nutrition Risk Screening Details Patient Name: Date of Service: [name], [name] 05/04/2019 9:00 AM Medical Record [id number] Patient Account Number: [id number] Date of Birth/Sex: Treating RN: [birth year] (69 y.o. [name] Primary Care Kara Mierzejewski: Fanny Bien Other Clinician: Referring Kiylee Thoreson: Treating Tennyson Kallen/Extender:Robson, Gennette Pac, Rojelio Brenner in Treatment: 0 Height (in): 71 Weight (lbs): 192 Body Mass Index (BMI): 26.8 Nutrition Risk Screening Items Score Screening NUTRITION RISK SCREEN: I have an illness or condition that made me change the kind and/or 2 Yes amount of food I eat I eat fewer than two meals per day 0 No I eat few fruits and vegetables, or milk products 2 Yes I have three or more drinks of beer, liquor or wine almost every day 0 No I have tooth or mouth problems that make it hard for me to eat 0 No I don't always have enough money to buy the food I need 0 No I eat alone most of the time 0 No I take three or more different prescribed or over-the-counter drugs a day 1 Yes 2 Yes Without wanting to, I have lost or gained 10 pounds in the last six months I am not always physically able to shop, cook and/or feed myself 0 No Nutrition Protocols Good Risk Protocol Moderate Risk Protocol Provide education on elevated blood sugars and High Risk Proctocol 0 impact on wound healing, as applicable Risk Level: High Risk Score: 7 Electronic Signature(s) Signed: 05/04/2019 6:19:30 PM By: Baruch Gouty RN, BSN Entered By: Baruch Gouty on 05/04/2019 09:46:36

## 2019-05-05 ENCOUNTER — Ambulatory Visit: Payer: Medicare Other

## 2019-05-05 NOTE — Progress Notes (Signed)
[name], [name] ([id number]) Visit Report for 05/04/2019 Chief Complaint Document Details Patient Name: Date of Service: [name], [name] 05/04/2019 9:00 AM Medical Record [id number] Patient Account Number: [id number] Date of Birth/Sex: Treating RN: November 19, 1950 (69 y.o. M) Primary Care Provider: Fanny Bien Other Clinician: Referring Provider: Treating Provider/Extender:Varick Keys, Gennette Pac, Rojelio Brenner in Treatment: 0 Information Obtained from: Patient Chief Complaint 05/04/2019; patient is here for review of wound on his right lateral foot and right anterior lower leg Electronic Signature(s) Signed: 05/04/2019 5:32:11 PM By: Linton Ham MD Entered By: Linton Ham on 05/04/2019 10:37:54 -------------------------------------------------------------------------------- Debridement Details Patient Name: Date of Service: [name], [name] 05/04/2019 9:00 AM Medical Record [id number] Patient Account Number: [id number] Date of Birth/Sex: Treating RN: Oct 05, 1950 (69 y.o. M) Primary Care Provider: Fanny Bien Other Clinician: Referring Provider: Treating Provider/Extender:Steward Sames, Gennette Pac, Rojelio Brenner in Treatment: 0 Debridement Performed for Wound #5 Right,Lateral Foot Assessment: Performed By: Physician Ricard Dillon., MD Debridement Type: Debridement Severity of Tissue Pre Fat layer exposed Debridement: Level of Consciousness (Pre- Awake and Alert procedure): Pre-procedure Verification/Time Out Taken: Yes - 10:24 Start Time: 10:24 Pain Control: Lidocaine 5% topical ointment Total Area Debrided (L x W): 1.5 (cm) x 1.2 (cm) = 1.8 (cm) Tissue and other material Viable, Non-Viable, Subcutaneous, Skin: Dermis , Skin: Epidermis debrided: Level: Skin/Subcutaneous Tissue Debridement Description: Excisional Instrument: Curette Bleeding: Moderate Hemostasis Achieved: Silver Nitrate End Time: 10:26 Procedural Pain: 2 Post Procedural Pain:  0 Response to Treatment: Procedure was tolerated well Level of Consciousness Awake and Alert (Post-procedure): Post Debridement Measurements of Total Wound Length: (cm) 1.5 Width: (cm) 1.2 Depth: (cm) 0.1 Volume: (cm) 0.141 Character of Wound/Ulcer Post Improved Debridement: Severity of Tissue Post Debridement: Fat layer exposed Post Procedure Diagnosis Same as Pre-procedure Electronic Signature(s) Signed: 05/04/2019 5:32:11 PM By: Linton Ham MD Entered By: Linton Ham on 05/04/2019 10:37:24 -------------------------------------------------------------------------------- HPI Details Patient Name: Date of Service: [name] 05/04/2019 9:00 AM Medical Record [id number] Patient Account Number: [id number] Date of Birth/Sex: Treating RN: 1951-01-08 (69 y.o. M) Primary Care Provider: Fanny Bien Other Clinician: Referring Provider: Treating Provider/Extender:Quanetta Truss, Gennette Pac, Rojelio Brenner in Treatment: 0 History of Present Illness HPI Description: ADMISSION 05/04/2019 This is a 69 year old patient who spent a prolonged period of time in this clinic in 2014-2015 cared for by Dr. Jerline Pain with apparently predominantly wounds on the left plantar foot. This apparently healed up. He is a type II diabetic with a history of peripheral neuropathy and PAD. In September 2020 he ended up with a right fourth and fifth ray extensive amputation I believe by Dr. [name] for underlying osteomyelitis. Prior to the surgery he had arterial studies that were normal on the right with an ABI of 1.26 and triphasic waveforms and on the left noncompressible with triphasic waveforms. Unfortunately they did not do TBI's. In any case he apparently developed a nonhealing area on the right lateral foot. He has been applying Silvadene to this. He uses his own running shoe with a custom insert. He has an AFO on the left which is fixed in the other shoe. He is here for a second opinion of  this wound. Unfortunately 2 days ago he also had a fall and he has a considerably long laceration across the mid tibia. The only Steri-Stripped this they did not go to the ER this was not sutured although it probably should have been Past medical history; type 2 diabetes with a history of PAD and peripheral neuropathy, prostate CA treated with radiation, cervical  spondylosis, left foot ulcer, history of osteomyelitis coronary artery disease status post CABG in 2005 recent fistula creation in the left arm in preparation for the dialysis, gout and a history of venous insufficiency ABIs were not done in this clinic as they were done in September 1 0.26 on the right and 2.18 on the left but you noncompressible. TBI's were not done but his waveforms were said to be triphasic bilaterally Electronic Signature(s) Signed: 05/04/2019 5:32:11 PM By: Linton Ham MD Entered By: Linton Ham on 05/04/2019 10:41:07 -------------------------------------------------------------------------------- Chemical Cauterization Details Patient Name: Date of Service: [name], [name] 05/04/2019 9:00 AM Medical Record [id number] Patient Account Number: [id number] Date of Birth/Sex: Treating RN: 09-Jan-1951 (69 y.o. M) Primary Care Provider: Fanny Bien Other Clinician: Referring Provider: Treating Provider/Extender:Dorina Ribaudo, Gennette Pac, Rojelio Brenner in Treatment: 0 Procedure Performed for: Wound #6 Right,Anterior Lower Leg Performed By: Physician Ricard Dillon., MD Post Procedure Diagnosis Same as Pre-procedure Electronic Signature(s) Signed: 05/04/2019 5:32:11 PM By: Linton Ham MD Entered By: Linton Ham on 05/04/2019 10:37:31 -------------------------------------------------------------------------------- Physical Exam Details Patient Name: Date of Service: [name], [name] 05/04/2019 9:00 AM Medical Record [id number] Patient Account Number: [id number] Date of  Birth/Sex: Treating RN: 05-21-1950 (69 y.o. M) Primary Care Provider: Fanny Bien Other Clinician: Referring Provider: Treating Provider/Extender:Ifeoma Vallin, Gennette Pac, Rojelio Brenner in Treatment: 0 Constitutional Sitting or standing Blood Pressure is within target range for patient.. Pulse regular and within target range for patient.Marland Kitchen Respirations regular, non-labored and within target range.. Temperature is normal and within the target range for the patient.Marland Kitchen Appears in no distress. Eyes Conjunctivae clear. No discharge.no icterus. Respiratory work of breathing is normal. Cardiovascular Pedal pulses are palpable on the right. Integumentary (Hair, Skin) No erythema around the wound. Psychiatric appears at normal baseline. Notes Wound exam; the area in question was on the lateral remanent of his right foot. He has an protruding area of bone which I think is probably the remanent of the metatarsal although I did not look at his x-rays or review his surgery. He has 1 small open area. He had hyper granulation interestingly almost the small volcano superiorly with a indent. The intent had some depth but no purulence no drainage although the patient says it bleeds from this area. The rest of the small wound looked healthy with some epithelialization. I used a #5 curette and knocked it down the hyper granulation. Hemostasis with silver nitrate. He has a laceration type linear wound on the right anterior tibia. Weeping tissue which I cauterized with silver nitrate and I will reapply the Steri-Strips although I am not sure this is going to adhere Electronic Signature(s) Signed: 05/04/2019 5:32:11 PM By: Linton Ham MD Entered By: Linton Ham on 05/04/2019 10:45:24 -------------------------------------------------------------------------------- Physician Orders Details Patient Name: Date of Service: [name], [name] 05/04/2019 9:00 AM Medical Record [id number] Patient  Account Number: [id number] Date of Birth/Sex: Treating RN: November 05, 1950 (68 y.o. Oval Linsey Primary Care Provider: Fanny Bien Other Clinician: Referring Provider: Treating Provider/Extender:Clevie Prout, Gennette Pac, Rojelio Brenner in Treatment: 0 Verbal / Phone Orders: No Diagnosis Coding Follow-up Appointments Return Appointment in 1 week. Dressing Change Frequency Wound #5 Right,Lateral Foot Change Dressing every other day. Wound Cleansing May shower with protection. Primary Wound Dressing Wound #5 Right,Lateral Foot Hydrofera Blue Other: - felt to relieve pressure Wound #6 Right,Anterior Lower Leg Other: - steri strips Secondary Dressing Wound #6 Right,Anterior Lower Leg ABD pad Edema Control Kerlix and Coban - Right Lower Extremity Off-Loading Other: - surgical shoe to  right foot Electronic Signature(s) Signed: 05/04/2019 5:32:11 PM By: Linton Ham MD Signed: 05/05/2019 7:19:07 AM By: Carlene Coria RN Entered By: Carlene Coria on 05/04/2019 10:45:04 -------------------------------------------------------------------------------- Problem List Details Patient Name: Date of Service: [name], [name] 05/04/2019 9:00 AM Medical Record [id number] Patient Account Number: [id number] Date of Birth/Sex: Treating RN: [birth year] (69 y.o. M) Primary Care Provider: Fanny Bien Other Clinician: Referring Provider: Treating Provider/Extender:Keyshia Orwick, Gennette Pac, Rojelio Brenner in Treatment: 0 Active Problems ICD-10 Evaluated Encounter Code Description Active Date Today Diagnosis E11.621 Type 2 diabetes mellitus with foot ulcer 05/04/2019 No Yes L97.511 Non-pressure chronic ulcer of other part of right foot 05/04/2019 No Yes limited to breakdown of skin E11.42 Type 2 diabetes mellitus with diabetic polyneuropathy 05/04/2019 No Yes S81.811D Laceration without foreign body, right lower leg, 05/04/2019 No Yes subsequent encounter Inactive Problems Resolved  Problems Electronic Signature(s) Signed: 05/04/2019 5:32:11 PM By: Linton Ham MD Entered By: Linton Ham on 05/04/2019 10:37:05 -------------------------------------------------------------------------------- Progress Note Details Patient Name: Date of Service: [name], [name] 05/04/2019 9:00 AM Medical Record [id number] Patient Account Number: [id number] Date of Birth/Sex: Treating RN: Dec 09, 1950 (69 y.o. M) Primary Care Provider: Fanny Bien Other Clinician: Referring Provider: Treating Provider/Extender:Waver Dibiasio, Gennette Pac, Rojelio Brenner in Treatment: 0 Subjective Chief Complaint Information obtained from Patient 05/04/2019; patient is here for review of wound on his right lateral foot and right anterior lower leg History of Present Illness (HPI) ADMISSION 05/04/2019 This is a 69 year old patient who spent a prolonged period of time in this clinic in 2014-2015 cared for by Dr. Jerline Pain with apparently predominantly wounds on the left plantar foot. This apparently healed up. He is a type II diabetic with a history of peripheral neuropathy and PAD. In September 2020 he ended up with a right fourth and fifth ray extensive amputation I believe by Dr. [name] for underlying osteomyelitis. Prior to the surgery he had arterial studies that were normal on the right with an ABI of 1.26 and triphasic waveforms and on the left noncompressible with triphasic waveforms. Unfortunately they did not do TBI's. In any case he apparently developed a nonhealing area on the right lateral foot. He has been applying Silvadene to this. He uses his own running shoe with a custom insert. He has an AFO on the left which is fixed in the other shoe. He is here for a second opinion of this wound. Unfortunately 2 days ago he also had a fall and he has a considerably long laceration across the mid tibia. The only Steri-Stripped this they did not go to the ER this was not sutured although it probably  should have been Past medical history; type 2 diabetes with a history of PAD and peripheral neuropathy, prostate CA treated with radiation, cervical spondylosis, left foot ulcer, history of osteomyelitis coronary artery disease status post CABG in 2005 recent fistula creation in the left arm in preparation for the dialysis, gout and a history of venous insufficiency ABIs were not done in this clinic as they were done in September 1 0.26 on the right and 2.18 on the left but you noncompressible. TBI's were not done but his waveforms were said to be triphasic bilaterally Patient History Information obtained from Patient. Allergies mushroom (Severity: Moderate, Reaction: nausea) Family History Cancer - Father,Siblings, Diabetes - Mother,Father,Siblings, Heart Disease - Father, Hypertension - Mother, Kidney Disease - Mother,Father, No family history of Hereditary Spherocytosis, Lung Disease, Seizures, Stroke, Thyroid Problems, Tuberculosis. Social History Former smoker - quit 30 yrs ago, Marital Status - Married,  Alcohol Use - Never, Drug Use - No History, Caffeine Use - Daily - coffee. Medical History Cardiovascular Patient has history of Coronary Artery Disease, Hypertension Denies history of Congestive Heart Failure Endocrine Patient has history of Type II Diabetes Denies history of Type I Diabetes Genitourinary Patient has history of End Stage Renal Disease - starting HD Integumentary (Skin) Denies history of History of Burn Musculoskeletal Patient has history of Gout, Osteomyelitis Denies history of Rheumatoid Arthritis, Osteoarthritis Neurologic Patient has history of Neuropathy Oncologic Patient has history of Received Radiation - 2018 Denies history of Received Chemotherapy Psychiatric Patient has history of Anorexia/bulimia, Confinement Anxiety Patient is treated with Insulin. Blood sugar is tested. Blood sugar results noted at the following times: Breakfast  - <100. Hospitalization/Surgery History - right 4th and fifth toe ray amputation. - left 5th toe amputation. - cervical fusion. - lumbar dickectomy. - CABG 4 vessel. - cholecystectomy. Medical And Surgical History Notes Cardiovascular hypercholesteremia Endocrine hypothyroidism Genitourinary CKD, prostate CA Musculoskeletal abnormal gait Neurologic cervical myelopathy Oncologic prostate CA Psychiatric poor appetite secondary to nausea Review of Systems (ROS) Constitutional Symptoms (General Health) Complains or has symptoms of Fatigue. Eyes Complains or has symptoms of Glasses / Contacts - contacts. Denies complaints or symptoms of Dry Eyes, Vision Changes. Ear/Nose/Mouth/Throat Denies complaints or symptoms of Chronic sinus problems or rhinitis. Respiratory Denies complaints or symptoms of Chronic or frequent coughs, Shortness of Breath. Gastrointestinal Complains or has symptoms of Nausea. Denies complaints or symptoms of Frequent diarrhea, Vomiting. Endocrine Denies complaints or symptoms of Heat/cold intolerance. Genitourinary Denies complaints or symptoms of Frequent urination. Integumentary (Skin) Complains or has symptoms of Wounds - right foot and righ tlower leg. Neurologic Complains or has symptoms of Numbness/parasthesias - both feet. Psychiatric Complains or has symptoms of Claustrophobia. Denies complaints or symptoms of Suicidal. Objective Constitutional Sitting or standing Blood Pressure is within target range for patient.. Pulse regular and within target range for patient.Marland Kitchen Respirations regular, non-labored and within target range.. Temperature is normal and within the target range for the patient.Marland Kitchen Appears in no distress. Vitals Time Taken: 9:18 AM, Height: 71 in, Source: Stated, Weight: 192 lbs, Source: Stated, BMI: 26.8, Temperature: 97.6 F, Pulse: 71 bpm, Respiratory Rate: 18 breaths/min, Blood Pressure: 130/64 mmHg, Capillary Blood Glucose: 97  mg/dl. General Notes: blood sugar per pt report this am Eyes Conjunctivae clear. No discharge.no icterus. Respiratory work of breathing is normal. Cardiovascular Pedal pulses are palpable on the right. Psychiatric appears at normal baseline. General Notes: Wound exam; the area in question was on the lateral remanent of his right foot. He has an protruding area of bone which I think is probably the remanent of the metatarsal although I did not look at his x-rays or review his surgery. He has 1 small open area. He had hyper granulation interestingly almost the small volcano superiorly with a indent. The intent had some depth but no purulence no drainage although the patient says it bleeds from this area. The rest of the small wound looked healthy with some epithelialization. I used a #5 curette and knocked it down the hyper granulation. Hemostasis with silver nitrate. ooHe has a laceration type linear wound on the right anterior tibia. Weeping tissue which I cauterized with silver nitrate and I will reapply the Steri-Strips although I am not sure this is going to adhere Integumentary (Hair, Skin) No erythema around the wound. Wound #5 status is Open. Original cause of wound was Surgical Injury. The wound is located on the Right,Lateral Foot. The  wound measures 1.5cm length x 1.2cm width x 0.1cm depth; 1.414cm^2 area and 0.141cm^3 volume. There is Fat Layer (Subcutaneous Tissue) Exposed exposed. There is no tunneling or undermining noted. There is a medium amount of serosanguineous drainage noted. The wound margin is flat and intact. There is large (67-100%) red, hyper - granulation within the wound bed. There is no necrotic tissue within the wound bed. Wound #6 status is Open. Original cause of wound was Trauma. The wound is located on the Right,Anterior Lower Leg. The wound measures 8.2cm length x 1cm width x 0.1cm depth; 6.44cm^2 area and 0.644cm^3 volume. There is Fat Layer (Subcutaneous  Tissue) Exposed exposed. There is no tunneling or undermining noted. There is a large amount of sanguinous drainage noted. The wound margin is flat and intact. There is medium (34-66%) red granulation within the wound bed. There is no necrotic tissue within the wound bed. Assessment Active Problems ICD-10 Type 2 diabetes mellitus with foot ulcer Non-pressure chronic ulcer of other part of right foot limited to breakdown of skin Type 2 diabetes mellitus with diabetic polyneuropathy Laceration without foreign body, right lower leg, subsequent encounter Procedures Wound #5 Pre-procedure diagnosis of Wound #5 is a Diabetic Wound/Ulcer of the Lower Extremity located on the Right,Lateral Foot .Severity of Tissue Pre Debridement is: Fat layer exposed. There was a Excisional Skin/Subcutaneous Tissue Debridement with a total area of 1.8 sq cm performed by Ricard Dillon., MD. With the following instrument(s): Curette to remove Viable and Non-Viable tissue/material. Material removed includes Subcutaneous Tissue, Skin: Dermis, and Skin: Epidermis after achieving pain control using Lidocaine 5% topical ointment. No specimens were taken. A time out was conducted at 10:24, prior to the start of the procedure. A Moderate amount of bleeding was controlled with Silver Nitrate. The procedure was tolerated well with a pain level of 2 throughout and a pain level of 0 following the procedure. Post Debridement Measurements: 1.5cm length x 1.2cm width x 0.1cm depth; 0.141cm^3 volume. Character of Wound/Ulcer Post Debridement is improved. Severity of Tissue Post Debridement is: Fat layer exposed. Post procedure Diagnosis Wound #5: Same as Pre-Procedure Wound #6 Pre-procedure diagnosis of Wound #6 is an Abrasion located on the Right,Anterior Lower Leg . An Chemical Cauterization procedure was performed by Ricard Dillon., MD. Post procedure Diagnosis Wound #6: Same as Pre-Procedure Plan Follow-up  Appointments: Return Appointment in 1 week. Dressing Change Frequency: Wound #5 Right,Lateral Foot: Change Dressing every other day. Wound Cleansing: May shower with protection. Primary Wound Dressing: Wound #5 Right,Lateral Foot: Hydrofera Blue Other: - felt to relieve pressure Wound #6 Right,Anterior Lower Leg: Other: - steri strips Secondary Dressing: Wound #6 Right,Anterior Lower Leg: ABD pad Edema Control: Kerlix and Coban - Right Lower Extremity Off-Loading: Other: - surgical shoe to right foot 1. Post debridement we are going to use Hydrofera Blue on the wound on the right anterior foot 2. I am going to change him to a surgical shoe to see if we can offload this better although it seems like he walks on the outside of his foot we will see if we can manage in a surgical shoe and whether this is helpful. He has a modified insole in the right shoe he has been wearing although I think there is probably too much pressure and friction in this area 3. He does have peripheral neuropathy but he does not have significant PAD. His peripheral pulses are easily palpable 4. No current evidence of infection although he tells me that nephrology gave him  2 rounds of doxycycline. I do not think any further antibiotics are necessary at this point I did not do any cultures I spent 35 minutes in the research of this patient's record, face-to-face examination and preparation of this note Electronic Signature(s) Signed: 05/04/2019 10:45:56 AM By: Linton Ham MD Entered By: Linton Ham on 05/04/2019 10:45:55 -------------------------------------------------------------------------------- HxROS Details Patient Name: Date of Service: [name], [name] 05/04/2019 9:00 AM Medical Record [id number] Patient Account Number: [id number] Date of Birth/Sex: Treating RN: [birth year] (69 y.o. Ernestene Mention Primary Care Provider: Fanny Bien Other Clinician: Referring Provider: Treating  Provider/Extender:Shaquandra Galano, Gennette Pac, Rojelio Brenner in Treatment: 0 Information Obtained From Patient Constitutional Symptoms (General Health) Complaints and Symptoms: Positive for: Fatigue Eyes Complaints and Symptoms: Positive for: Glasses / Contacts - contacts Negative for: Dry Eyes; Vision Changes Ear/Nose/Mouth/Throat Complaints and Symptoms: Negative for: Chronic sinus problems or rhinitis Respiratory Complaints and Symptoms: Negative for: Chronic or frequent coughs; Shortness of Breath Gastrointestinal Complaints and Symptoms: Positive for: Nausea Negative for: Frequent diarrhea; Vomiting Endocrine Complaints and Symptoms: Negative for: Heat/cold intolerance Medical History: Positive for: Type II Diabetes Negative for: Type I Diabetes Past Medical History Notes: hypothyroidism Time with diabetes: 7 yrs Treated with: Insulin Blood sugar tested every day: Yes Tested : once Blood sugar testing results: Breakfast: <100 Genitourinary Complaints and Symptoms: Negative for: Frequent urination Medical History: Positive for: End Stage Renal Disease - starting HD Past Medical History Notes: CKD, prostate CA Integumentary (Skin) Complaints and Symptoms: Positive for: Wounds - right foot and righ tlower leg Medical History: Negative for: History of Burn Neurologic Complaints and Symptoms: Positive for: Numbness/parasthesias - both feet Medical History: Positive for: Neuropathy Past Medical History Notes: cervical myelopathy Psychiatric Complaints and Symptoms: Positive for: Claustrophobia Negative for: Suicidal Medical History: Positive for: Anorexia/bulimia; Confinement Anxiety Past Medical History Notes: poor appetite secondary to nausea Hematologic/Lymphatic Cardiovascular Medical History: Positive for: Coronary Artery Disease; Hypertension Negative for: Congestive Heart Failure Past Medical History  Notes: hypercholesteremia Immunological Musculoskeletal Medical History: Positive for: Gout; Osteomyelitis Negative for: Rheumatoid Arthritis; Osteoarthritis Past Medical History Notes: abnormal gait Oncologic Medical History: Positive for: Received Radiation - 2018 Negative for: Received Chemotherapy Past Medical History Notes: prostate CA Immunizations Pneumococcal Vaccine: Received Pneumococcal Vaccination: Yes Implantable Devices Yes Hospitalization / Surgery History Type of Hospitalization/Surgery right 4th and fifth toe ray amputation left 5th toe amputation cervical fusion lumbar dickectomy CABG 4 vessel cholecystectomy Family and Social History Cancer: Yes - Father,Siblings; Diabetes: Yes - Mother,Father,Siblings; Heart Disease: Yes - Father; Hereditary Spherocytosis: No; Hypertension: Yes - Mother; Kidney Disease: Yes - Mother,Father; Lung Disease: No; Seizures: No; Stroke: No; Thyroid Problems: No; Tuberculosis: No; Former smoker - quit 30 yrs ago; Marital Status - Married; Alcohol Use: Never; Drug Use: No History; Caffeine Use: Daily - coffee; Financial Concerns: No; Food, Clothing or Shelter Needs: No; Support System Lacking: No; Transportation Concerns: No Electronic Signature(s) Signed: 05/04/2019 5:32:11 PM By: Linton Ham MD Signed: 05/04/2019 6:19:30 PM By: Baruch Gouty RN, BSN Entered By: Baruch Gouty on 05/04/2019 09:44:08 -------------------------------------------------------------------------------- [place] Details Patient Name: Date of Service: [name], [name] 05/04/2019 Medical Record [id number] Patient Account Number: [id number] Date of Birth/Sex: Treating RN: 11/16/1950 (69 y.o. M) Primary Care Provider: Fanny Bien Other Clinician: Referring Provider: Treating Provider/Extender:Gurshaan Matsuoka, Gennette Pac, Rojelio Brenner in Treatment: 0 Diagnosis Coding ICD-10 Codes Code Description E11.621 Type 2 diabetes mellitus with  foot ulcer L97.511 Non-pressure chronic ulcer of other part of right foot limited to breakdown of skin E11.42 Type 2  diabetes mellitus with diabetic polyneuropathy S81.811D Laceration without foreign body, right lower leg, subsequent encounter Facility Procedures CPT4 Code Description: 11941740 [place] VISIT-LEV 3 EST PT Modifier: 25 Quantity: 1 CPT4 Code Description: [id number] 11042 - DEB SUBQ TISSUE 20 SQ CM/< ICD-10 Diagnosis Description L97.511 Non-pressure chronic ulcer of other part of right foot limit E11.621 Type 2 diabetes mellitus with foot ulcer Modifier: ed to breakdown Quantity: 1 of skin CPT4 Code Description: 63149702 17250 - CHEM CAUT GRANULATION TISS ICD-10 Diagnosis Description S81.811D Laceration without foreign body, right lower leg, subsequent Modifier: 24 encounter Quantity: 1 Physician Procedures CPT4 Code Description: [id number] [place] PHYS LEVEL 3 NEW PT ICD-10 Diagnosis Description E11.621 Type 2 diabetes mellitus with foot ulcer L97.511 Non-pressure chronic ulcer of other part of right foot li E11.42 Type 2 diabetes mellitus with diabetic  polyneuropathy S81.811D Laceration without foreign body, right lower leg, subsequ Modifier: 25 mited to breakd ent encounter Quantity: 1 own of skin CPT4 Code Description: 5027741 11042 - WC PHYS SUBQ TISS 20 SQ CM ICD-10 Diagnosis Description 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

## 2019-05-06 ENCOUNTER — Ambulatory Visit: Payer: Medicare Other | Admitting: Orthopedic Surgery

## 2019-05-06 NOTE — Progress Notes (Signed)
FRANSISCO, MESSMER ([id number]) Visit Report for 05/04/2019 Allergy List Details Patient Name: Date of Service: [name], [name] 05/04/2019 9:00 AM Medical Record [id number] Patient Account Number: [id number] Date of Birth/Sex: Treating RN: 04-11-1950 (69 y.o. Ernestene Mention Primary Care Shawneequa Baldridge: Fanny Bien Other Clinician: Referring Hanaa Payes: Treating Lashawn Orrego/Extender:Robson, Gennette Pac, Rojelio Brenner in Treatment: 0 Allergies Active Allergies mushroom Reaction: nausea Severity: Moderate Allergy Notes Electronic Signature(s) Signed: 05/04/2019 6:19:30 PM By: Baruch Gouty RN, BSN Entered By: Baruch Gouty on 05/04/2019 09:21:09 -------------------------------------------------------------------------------- Arrival Information Details Patient Name: Date of Service: [name], [name] 05/04/2019 9:00 AM Medical Record [id number] Patient Account Number: [id number] Date of Birth/Sex: Treating RN: [birth year] (69 y.o. Ernestene Mention Primary Care Russ Looper: Fanny Bien Other Clinician: Referring Kaedyn Polivka: Treating Dari Carpenito/Extender:Robson, Gennette Pac, Rojelio Brenner in Treatment: 0 Visit Information Patient Arrived: Ambulatory Arrival Time: 09:09 Accompanied By: spouse Transfer Assistance: None Patient Identification Verified: Yes Secondary Verification Process Completed: Yes Patient Requires Transmission-Based No Precautions: Patient Has Alerts: Yes Patient Alerts: R ABI = 1.26 L ABI = 2.18 History Since Last Visit Has Dressing in Place as Prescribed: Yes Electronic Signature(s) Signed: 05/04/2019 6:19:30 PM By: Baruch Gouty RN, BSN Entered By: Baruch Gouty on 05/04/2019 09:58:22 -------------------------------------------------------------------------------- Clinic Level of Care Assessment Details Patient Name: Date of Service: [name], [name] 05/04/2019 9:00 AM Medical Record [id number] Patient Account Number: [id number] Date of  Birth/Sex: Treating RN: 07-Jan-1951 (69 y.o. Oval Linsey Primary Care Amber Guthridge: Fanny Bien Other Clinician: Referring Adrian Specht: Treating Brylinn Teaney/Extender:Robson, Gennette Pac, Rojelio Brenner in Treatment: 0 Clinic Level of Care Assessment Items TOOL 1 Quantity Score X - Use when EandM and Procedure is performed on INITIAL visit 1 0 ASSESSMENTS - Nursing Assessment / Reassessment X - General Physical Exam (combine w/ comprehensive assessment (listed just below) 1 20 when performed on new pt. evals) X - Comprehensive Assessment (HX, ROS, Risk Assessments, Wounds Hx, etc.) 1 25 ASSESSMENTS - Wound and Skin Assessment / Reassessment []  - Dermatologic / Skin Assessment (not related to wound area) 0 ASSESSMENTS - Ostomy and/or Continence Assessment and Care []  - Incontinence Assessment and Management 0 []  - Ostomy Care Assessment and Management (repouching, etc.) 0 PROCESS - Coordination of Care X - Simple Patient / Family Education for ongoing care 1 15 []  - Complex (extensive) Patient / Family Education for ongoing care 0 X - Staff obtains Programmer, systems, Records, Test Results / Process Orders 1 10 []  - Staff telephones HHA, Nursing Homes / Clarify orders / etc 0 []  - Routine Transfer to another Facility (non-emergent condition) 0 X - Routine Hospital Admission (non-emergent condition) 1 10 []  - New Admissions / Biomedical engineer / Ordering NPWT, Apligraf, etc. 0 []  - Emergency Hospital Admission (emergent condition) 0 PROCESS - Special Needs []  - Pediatric / Minor Patient Management 0 []  - Isolation Patient Management 0 []  - Hearing / Language / Visual special needs 0 []  - Assessment of Community assistance (transportation, D/C planning, etc.) 0 []  - Additional assistance / Altered mentation 0 []  - Support Surface(s) Assessment (bed, cushion, seat, etc.) 0 INTERVENTIONS - Miscellaneous []  - External ear exam 0 []  - Patient Transfer (multiple staff / Civil Service fast streamer /  Similar devices) 0 []  - Simple Staple / Suture removal (25 or less) 0 []  - Complex Staple / Suture removal (26 or more) 0 []  - Hypo/Hyperglycemic Management (do not check if billed separately) 0 X - Ankle / Brachial Index (ABI) - do not check if billed separately 1 15 Has the patient  been seen at the hospital within the last three years: Yes Total Score: 95 Level Of Care: New/Established - Level 3 Electronic Signature(s) Signed: 05/05/2019 7:19:07 AM By: Carlene Coria RN Entered By: Carlene Coria on 05/04/2019 10:30:41 -------------------------------------------------------------------------------- Encounter Discharge Information Details Patient Name: Date of Service: [name], [name] 05/04/2019 9:00 AM Medical Record [id number] Patient Account Number: [id number] Date of Birth/Sex: Treating RN: Jun 15, 1950 (69 y.o. [name] Primary Care Berthe Oley: Fanny Bien Other Clinician: Referring Yani Lal: Treating Jaiquan Temme/Extender:Robson, Gennette Pac, Rojelio Brenner in Treatment: 0 Encounter Discharge Information Items Post Procedure Vitals Discharge Condition: Stable Temperature (F): 97.6 Ambulatory Status: Ambulatory Pulse (bpm): 71 Discharge Destination: Home Respiratory Rate (breaths/min): 18 Transportation: Private Auto Blood Pressure (mmHg): 130/64 Accompanied By: wife Schedule Follow-up Appointment: Yes Clinical Summary of Care: Patient Declined Electronic Signature(s) Signed: 05/06/2019 7:44:20 AM By: Kela Millin Entered By: Kela Millin on 05/04/2019 11:01:20 -------------------------------------------------------------------------------- Lower Extremity Assessment Details Patient Name: Date of Service: [name], [name] 05/04/2019 9:00 AM Medical Record [id number] Patient Account Number: [id number] Date of Birth/Sex: Treating RN: [birth year] (69 y.o. Ernestene Mention Primary Care Romell Wolden: Fanny Bien Other Clinician: Referring Shakya Sebring:  Treating Moneka Mcquinn/Extender:Robson, Gennette Pac, Rojelio Brenner in Treatment: 0 Edema Assessment Assessed: [Left: No] [Right: No] Edema: [Left: No] [Right: No] Calf Left: Right: Point of Measurement: cm From Medial Instep 33.8 cm 34 cm Ankle Left: Right: Point of Measurement: cm From Medial Instep 19.7 cm 20.3 cm Vascular Assessment Pulses: Dorsalis Pedis Palpable: [Left:Yes] [Right:Yes] Electronic Signature(s) Signed: 05/04/2019 6:19:30 PM By: Baruch Gouty RN, BSN Entered By: Baruch Gouty on 05/04/2019 09:50:27 -------------------------------------------------------------------------------- Multi Wound Chart Details Patient Name: Date of Service: [name] 05/04/2019 9:00 AM Medical Record [id number] Patient Account Number: [id number] Date of Birth/Sex: Treating RN: November 14, 1950 (69 y.o. M) Primary Care Ryle Buscemi: Fanny Bien Other Clinician: Referring Laquisha Northcraft: Treating Zina Pitzer/Extender:Robson, Gennette Pac, Rojelio Brenner in Treatment: 0 Vital Signs Height(in): 71 Capillary Blood 97 Glucose(mg/dl): Weight(lbs): 192 Pulse(bpm): 41 Body Mass Index(BMI): 66 Blood Pressure(mmHg): 130/64 Temperature(F): 97.6 Respiratory 18 Rate(breaths/min): Photos: [5:No Photos] [6:No Photos] [N/A:N/A] Wound Location: [5:Right Foot - Lateral] [6:Right Lower Leg - Anterior N/A] Wounding Event: [5:Surgical Injury] [6:Trauma] [N/A:N/A] Primary Etiology: [5: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] [N/A:N/A] Secondary Etiology: [5:Open Surgical Wound] [6:N/A] [N/A:N/A] Comorbid History: [5:Coronary Artery Disease, Coronary Artery Disease, N/A Hypertension, Type II Diabetes, End Stage Renal Diabetes, End Stage Renal Disease, Gout, Osteomyelitis, Neuropathy, Osteomyelitis, Neuropathy, Received Radiation,  Anorexia/bulimia, Confinement Anxiety] [6:Hypertension, Type II Disease, Gout, Received Radiation, Anorexia/bulimia, Confinement Anxiety] Date  Acquired: [5:05/02/2019] [6:05/02/2019] [N/A:N/A] Weeks of Treatment: [5:0] [6:0] [N/A:N/A] Wound Status: [5:Open] [6:Open] [N/A:N/A] Measurements L x W x D 1.5x1.2x0.1 [6:8.2x1x0.1] [N/A:N/A] (cm) Area (cm) : [5:1.414] [6:6.44] [N/A:N/A] Volume (cm) : [5:0.141] [6:0.644] [N/A:N/A] Classification: [5:Grade 2] [6:Full Thickness Without Exposed Support Structures] [N/A:N/A] Exudate Amount: [5:Medium] [6:Large] [N/A:N/A] Exudate Type: [5:Serosanguineous] [6:Sanguinous] [N/A:N/A] Exudate Color: [5:red, brown] [6:red] [N/A:N/A] Wound Margin: [5:Flat and Intact] [6:Flat and Intact] [N/A:N/A] Granulation Amount: [5:Large (67-100%)] [6:Medium (34-66%)] [N/A:N/A] Granulation Quality: [5:Red, Hyper-granulation] [6:Red] [N/A:N/A] Necrotic Amount: [5:None Present (0%)] [6:None Present (0%)] [N/A:N/A] Exposed Structures: [5:Fat Layer (Subcutaneous Fat Layer (Subcutaneous N/A Tissue) Exposed: Yes Fascia: No Tendon: No Muscle: No Joint: No Bone: No] [6:Tissue) Exposed: Yes Fascia: No Tendon: No Muscle: No Joint: No Bone: No] Epithelialization: [5:Small (1-33%)] [6:Medium (34-66%)] [N/A:N/A] Debridement: [5:Debridement - Excisional N/A] [N/A:N/A] Pre-procedure [5:10:24] [6:N/A] [N/A:N/A] Verification/Time Out Taken: Pain Control: [5:Lidocaine 5% topical ointment] [6:N/A] [N/A:N/A] Tissue Debrided: [5:Subcutaneous] [6:N/A] [N/A:N/A] Level: [5:Skin/Subcutaneous Tissue N/A] [N/A:N/A] Debridement  Area (sq cm):1.8 [6:N/A] [N/A:N/A] Instrument: [5:Curette] [6:N/A] [N/A:N/A] Bleeding: [5:Moderate] [6:N/A] [N/A:N/A] Hemostasis Achieved: [5:Silver Nitrate] [6:N/A] [N/A:N/A] Procedural Pain: [5:2] [6:N/A] Post Procedural Pain: [5:0] [6:N/A] Debridement Treatment [5:Procedure was tolerated] [6:N/A] Response: [5:well] Post Debridement [5:1.5x1.2x0.1] [6:N/A] Measurements L x W x D (cm) Post Debridement [5:0.141] [6:N/A] Volume: (cm) Procedures Performed: [5:Debridement] [6:Chemical  Cauterization] Treatment Notes Electronic Signature(s) Signed: 05/04/2019 5:32:11 PM By: Linton Ham MD Entered By: Linton Ham on 05/04/2019 10:37:11 -------------------------------------------------------------------------------- Multi-Disciplinary Care Plan Details Patient Name: Date of Service: [name], [name] 05/04/2019 9:00 AM Medical Record [id number] Patient Account Number: [id number] Date of Birth/Sex: Treating RN: 01/18/1951 (68 y.o. Oval Linsey Primary Care Lynnel Zanetti: Fanny Bien Other Clinician: Referring Ellar Hakala: Treating Eden Toohey/Extender:Robson, Gennette Pac, Rojelio Brenner in Treatment: 0 Active Inactive Abuse / Safety / Falls / Self Care Management Nursing Diagnoses: Potential for injury related to falls Goals: Patient will not experience any injury related to falls Date Initiated: 05/04/2019 Target Resolution Date: 06/04/2019 Goal Status: Active Interventions: Assess Activities of Daily Living upon admission and as needed Assess fall risk on admission and as needed Assess: immobility, friction, shearing, incontinence upon admission and as needed Assess impairment of mobility on admission and as needed per policy Assess personal safety and home safety (as indicated) on admission and as needed Assess self care needs on admission and as needed Notes: Wound/Skin Impairment Nursing Diagnoses: Knowledge deficit related to ulceration/compromised skin integrity Goals: Patient/caregiver will verbalize understanding of skin care regimen Date Initiated: 05/04/2019 Target Resolution Date: 06/04/2019 Goal Status: Active Ulcer/skin breakdown will have a volume reduction of 30% by week 4 Date Initiated: 05/04/2019 Target Resolution Date: 06/04/2019 Goal Status: Active Interventions: Assess patient/caregiver ability to obtain necessary supplies Assess patient/caregiver ability to perform ulcer/skin care regimen upon admission and as needed Assess  ulceration(s) every visit Notes: Electronic Signature(s) Signed: 05/05/2019 7:19:07 AM By: Carlene Coria RN Entered By: Carlene Coria on 05/04/2019 10:19:48 -------------------------------------------------------------------------------- Pain Assessment Details Patient Name: Date of Service: [name], [name] 05/04/2019 9:00 AM Medical Record [id number] Patient Account Number: [id number] Date of Birth/Sex: Treating RN: 04/01/1951 (69 y.o. Ernestene Mention Primary Care Quaran Kedzierski: Fanny Bien Other Clinician: Referring Ulric Salzman: Treating Nena Hampe/Extender:Robson, Gennette Pac, Rojelio Brenner in Treatment: 0 Active Problems Location of Pain Severity and Description of Pain Patient Has Paino Yes Site Locations Pain Location: Pain in Ulcers With Dressing Change: No Duration of the Pain. Constant / Intermittento Intermittent Rate the pain. Current Pain Level: 2 Worst Pain Level: 8 Character of Pain Describe the Pain: Shooting, Other: stinging Pain Management and Medication Current Pain Management: Medication: Yes Is the Current Pain Management Adequate: Adequate Rest: Yes How does your wound impact your activities of daily livingo Sleep: Yes Bathing: No Appetite: No Relationship With Others: No Bladder Continence: No Emotions: Yes Bowel Continence: No Work: No Toileting: No Drive: No Dressing: No Hobbies: No Electronic Signature(s) Signed: 05/04/2019 6:19:30 PM By: Baruch Gouty RN, BSN Entered By: Baruch Gouty on 05/04/2019 09:57:14 -------------------------------------------------------------------------------- Patient/Caregiver Education Details Patient Name: Date of Service: [name], [name] 1/26/2021andnbsp9:00 AM Medical Record [id number] Patient Account Number: [id number] Date of Birth/Gender: Treating RN: [birth year] (68 y.o. Oval Linsey Primary Care Physician: Fanny Bien Other Clinician: Referring Physician: Treating  Physician/Extender:Robson, Gennette Pac, Rojelio Brenner in Treatment: 0 Education Assessment Education Provided To: Patient Education Topics Provided Wound/Skin Impairment: Methods: Explain/Verbal Responses: State content correctly Electronic Signature(s) Signed: 05/05/2019 7:19:07 AM By: Carlene Coria RN Entered By: Carlene Coria on 05/04/2019 10:19:59 -------------------------------------------------------------------------------- Wound Assessment Details Patient Name:  Date of Service: [name], [name] 05/04/2019 9:00 AM Medical Record [id number] Patient Account Number: [id number] Date of Birth/Sex: Treating RN: 09-06-1950 (69 y.o. M) Primary Care Kiylee Thoreson: Fanny Bien Other Clinician: Referring Dim Meisinger: Treating Caylynn Minchew/Extender:Robson, Gennette Pac, Rojelio Brenner in Treatment: 0 Wound Status Wound Number: 5 Primary Diabetic Wound/Ulcer of the Lower Extremity Etiology: Wound Location: Right Foot - Lateral Secondary Open Surgical Wound Wounding Event: Surgical Injury Etiology: Date Acquired: 05/02/2019 Wound Open Weeks Of Treatment: 0 Status: Clustered Wound: No Comorbid Coronary Artery Disease, Hypertension, Type History: II Diabetes, End Stage Renal Disease, Gout, Osteomyelitis, Neuropathy, Received Radiation, Anorexia/bulimia, Confinement Anxiety Photos Wound Measurements Length: (cm) 1.5 % Reduction in Width: (cm) 1.2 % Reduction in Depth: (cm) 0.1 Epithelializat Area: (cm) 1.414 Tunneling: Volume: (cm) 0.141 Undermining: Wound Description Classification: Grade 2 Foul Odor Aft Wound Margin: Flat and Intact Slough/Fibrin Exudate Amount: Medium Exudate Type: Serosanguineous Exudate Color: red, brown Wound Bed Granulation Amount: Large (67-100%) Granulation Quality: Red, Hyper-granulation Fascia Expose Necrotic Amount: None Present (0%) Fat Layer (Su Tendon Expose Muscle Expose Joint Exposed Bone Exposed: er Cleansing: No o  No Exposed Structure d: No bcutaneous Tissue) Exposed: Yes d: No d: No : No No Area: 0% Volume: 0% ion: Small (1-33%) No No Treatment Notes Wound #5 (Right, Lateral Foot) 1. Cleanse With Wound Cleanser 2. Periwound Care Moisturizing lotion Skin Prep 3. Primary Dressing Applied Hydrofera Blue 4. Secondary Dressing Dry Gauze 6. Support Layer Holiday representative) Signed: 05/04/2019 5:09:30 PM By: Mikeal Hawthorne EMT/HBOT Entered By: Mikeal Hawthorne on 05/04/2019 15:46:53 -------------------------------------------------------------------------------- Wound Assessment Details Patient Name: Date of Service: [name], [name] 05/04/2019 9:00 AM Medical Record [id number] Patient Account Number: [id number] Date of Birth/Sex: Treating RN: 10-Aug-1950 (69 y.o. M) Primary Care Sahasra Belue: Fanny Bien Other Clinician: Referring Azad Calame: Treating Tabor Bartram/Extender:Robson, Gennette Pac, Rojelio Brenner in Treatment: 0 Wound Status Wound Number: 6 Primary Abrasion Etiology: Wound Location: Right Lower Leg - Anterior Wound Open Wounding Event: Trauma Status: Date Acquired: 05/02/2019 Comorbid Coronary Artery Disease, Hypertension, Type II Weeks Of Treatment: 0 History: Diabetes, End Stage Renal Disease, Gout, Clustered Wound: No Osteomyelitis, Neuropathy, Received Radiation, Anorexia/bulimia, Confinement Anxiety Photos Wound Measurements Length: (cm) 8.2 % Reduct Width: (cm) 1 % Reduct Depth: (cm) 0.1 Epitheli Area: (cm) 6.44 Tunneli Volume: (cm) 0.644 Undermi Wound Description Full Thickness Without Exposed Support Foul Od Classification: Structures Classification: Structures Slou Wound Flat and Intact Margin: Exudate Large Amount: Exudate Sanguinous Type: Exudate red Color: Wound Bed Granulation Amount: Medium (34-66%) Granulation Quality: Red Fasc Necrotic Amount: None Present (0%) Fat Tend Musc Join Bone or After  Cleansing: No gh/Fibrino No Exposed Structure ia Exposed: No Layer (Subcutaneous Tissue) Exposed: Yes on Exposed: No le Exposed: No t Exposed: No Exposed: No ion in Area: 0% ion in Volume: 0% alization: Medium (34-66%) ng: No ning: No Treatment Notes Wound #6 (Right, Anterior Lower Leg) 1. Cleanse With Wound Cleanser 2. Periwound Care Skin Prep 3. Primary Dressing Applied Other primary dressing (specifiy in notes) 6. Support Layer Applied Kerlix/Coban Notes primary dressing is Dispensing optician) Signed: 05/04/2019 5:09:30 PM By: Mikeal Hawthorne EMT/HBOT Entered By: Mikeal Hawthorne on 05/04/2019 15:46:21 -------------------------------------------------------------------------------- Vitals Details Patient Name: Date of Service: [name], [name] 05/04/2019 9:00 AM Medical Record [id number] Patient Account Number: [id number] Date of Birth/Sex: Treating RN: Jul 07, 1950 (68 y.o. Ernestene Mention Primary Care Jovonni Borquez: Fanny Bien Other Clinician: Referring Kayana Thoen: Treating Qais Jowers/Extender:Robson, Gennette Pac, Rojelio Brenner in Treatment: 0 Vital Signs Time Taken: 09:18 Temperature (F): 97.6 Height (  in): 71 Pulse (bpm): 71 Source: Stated Respiratory Rate (breaths/min): 18 Weight (lbs): 192 Blood Pressure (mmHg): 130/64 Source: Stated Capillary Blood Glucose (mg/dl): 97 Body Mass Index (BMI): 26.8 Reference Range: 80 - 120 mg / dl Notes blood sugar per pt report this am Electronic Signature(s) Signed: 05/04/2019 6:19:30 PM By: Baruch Gouty RN, BSN Entered By: Baruch Gouty on 05/04/2019 09:20:12

## 2019-05-07 ENCOUNTER — Telehealth (HOSPITAL_COMMUNITY): Payer: Self-pay

## 2019-05-07 ENCOUNTER — Other Ambulatory Visit: Payer: Self-pay

## 2019-05-07 ENCOUNTER — Ambulatory Visit (INDEPENDENT_AMBULATORY_CARE_PROVIDER_SITE_OTHER): Payer: Medicare Other

## 2019-05-07 DIAGNOSIS — R0989 Other specified symptoms and signs involving the circulatory and respiratory systems: Secondary | ICD-10-CM | POA: Diagnosis not present

## 2019-05-07 NOTE — Telephone Encounter (Signed)

## 2019-05-07 NOTE — Progress Notes (Signed)
POST OPERATIVE OFFICE NOTE    CC:  F/u for surgery  HPI:  This is a 69 y.o. male who is s/p left 1st stage BVT on 03/25/2019 by Dr. Trula Slade.  He was scheduled to have [facility] placed at [facility] on 05/04/2019.  The pt has some mild tingling in the fingertips on his left hand.  He does not have any pain in his left hand.  He states he did get his catheter last week and is now dialyzing on M/W/F at the [facility] location.  He wants to get the surgery done as soon as possible.  He wants the catheter out as soon as possible as well.   He is scheduled to get his first covid vaccine on Thursday as it was cancelled last week.     Allergies  Allergen Reactions  . Mushroom Extract Complex Nausea Only    Current Outpatient Medications  Medication Sig Dispense Refill  . acetaminophen (TYLENOL) 500 MG tablet Take 1,500 mg by mouth every 6 (six) hours as needed (pain).    Marland Kitchen albuterol (VENTOLIN HFA) 108 (90 Base) MCG/ACT inhaler Inhale 2 puffs into the lungs every 4 (four) hours as needed for wheezing or shortness of breath. 18 g 0  . allopurinol (ZYLOPRIM) 100 MG tablet Take 200 mg by mouth every morning.     . Alprostadil, Vasodilator, (CAVERJECT IC) 1 Dose by Intracavernosal route daily as needed (erectile dysfunction).    Marland Kitchen aspirin EC 81 MG tablet Take 81 mg by mouth every morning.     . calcitRIOL (ROCALTROL) 0.25 MCG capsule Take 0.25 mcg by mouth every morning.     . carvedilol (COREG) 25 MG tablet Take 12.5 mg by mouth 2 (two) times daily.    . DULoxetine (CYMBALTA) 60 MG capsule Take 1 capsule (60 mg total) by mouth daily. 90 capsule 1  . Insulin Degludec (TRESIBA FLEXTOUCH) 200 UNIT/ML SOPN Inject 40 Units into the skin at bedtime. (Patient taking differently: Inject 20 Units into the skin at bedtime. )    . levothyroxine (SYNTHROID, LEVOTHROID) 112 MCG tablet Take 112 mcg by mouth daily before breakfast. BRAND NAME SYNTHROID    . LYRICA 150 MG capsule Take 1 capsule (150 mg total) by  mouth at bedtime. (Patient taking differently: Take 150 mg by mouth 2 (two) times daily. )    . nitroGLYCERIN (NITRODUR - DOSED IN MG/24 HR) 0.2 mg/hr patch Place 1 patch (0.2 mg total) onto the skin daily. 30 patch 12  . OVER THE COUNTER MEDICATION Take 1 tablet by mouth 3 (three) times daily as needed (leg cramps). Hyland's Leg Cramps    . pentoxifylline (TRENTAL) 400 MG CR tablet TAKE 1 TABLET (400 MG TOTAL) BY MOUTH 3 (THREE) TIMES DAILY WITH MEALS. 270 tablet 1  . rosuvastatin (CRESTOR) 10 MG tablet Take 10 mg by mouth at bedtime.     . Semaglutide, 1 MG/DOSE, (OZEMPIC, 1 MG/DOSE,) 2 MG/1.5ML SOPN Inject 0.5 mg into the skin every Friday.     . triamcinolone (KENALOG) 0.025 % cream Apply 1 application topically daily as needed (psoriasis).     No current facility-administered medications for this visit.     ROS:  See HPI  Physical Exam:  Today's Vitals   05/10/19 1011  BP: 139/61  Pulse: 78  Resp: 18  Temp: (!) 96.5 F (35.8 C)  TempSrc: Temporal  Weight: 197 lb (89.4 kg)  Height: 5\' 10"  (1.778 m)   Body mass index is 28.27 kg/m.  Incision:  Well healed.  Extremities:  There is a palpable left radial pulse.  Motor and sensory are in tact.  There is a thrill/bruit present.  The left hand is slightly cooler than the right.    Dialysis Duplex on 05/10/2019: Diameter:  0.52cm-0.76cm Depth:  0.60cm-1.61cm Competing branches present   Assessment/Plan:  This is a 69 y.o. male who is s/p: Left 1st stage BVT on 03/25/2019 by Dr. Trula Slade  -the pt does have mild tingling and numbness in the tips of his fingers on the left hand.  I discussed steal sx with him in detail.  He states this is tolerable.  He knows that if he develops sores or motor difficulties, he will contact us sooner rather than later.   -his fistula is maturing nicely-we will schedule him for a 2nd stage left BVT by Dr. Trula Slade on a T/T  -If pt has tunneled dialysis catheter and the access has been used  successfully to the satisfaction of the dialysis center, the tunneled catheter can be removed at their discretion.   I discussed this with the pt and his wife (she was via telephone).    Leontine Locket, PA-C [facility] [phone]  Clinic MD:  Trula Slade

## 2019-05-07 NOTE — H&P (View-Only) (Signed)
POST OPERATIVE OFFICE NOTE    CC:  F/u for surgery  HPI:  This is a 69 y.o. male who is s/p left 1st stage BVT on 03/25/2019 by Dr. Trula Slade.  He was scheduled to have [facility] placed at [facility] on 05/04/2019.  The pt has some mild tingling in the fingertips on his left hand.  He does not have any pain in his left hand.  He states he did get his catheter last week and is now dialyzing on M/W/F at the [facility] location.  He wants to get the surgery done as soon as possible.  He wants the catheter out as soon as possible as well.   He is scheduled to get his first covid vaccine on Thursday as it was cancelled last week.     Allergies  Allergen Reactions  . Mushroom Extract Complex Nausea Only    Current Outpatient Medications  Medication Sig Dispense Refill  . acetaminophen (TYLENOL) 500 MG tablet Take 1,500 mg by mouth every 6 (six) hours as needed (pain).    Marland Kitchen albuterol (VENTOLIN HFA) 108 (90 Base) MCG/ACT inhaler Inhale 2 puffs into the lungs every 4 (four) hours as needed for wheezing or shortness of breath. 18 g 0  . allopurinol (ZYLOPRIM) 100 MG tablet Take 200 mg by mouth every morning.     . Alprostadil, Vasodilator, (CAVERJECT IC) 1 Dose by Intracavernosal route daily as needed (erectile dysfunction).    Marland Kitchen aspirin EC 81 MG tablet Take 81 mg by mouth every morning.     . calcitRIOL (ROCALTROL) 0.25 MCG capsule Take 0.25 mcg by mouth every morning.     . carvedilol (COREG) 25 MG tablet Take 12.5 mg by mouth 2 (two) times daily.    . DULoxetine (CYMBALTA) 60 MG capsule Take 1 capsule (60 mg total) by mouth daily. 90 capsule 1  . Insulin Degludec (TRESIBA FLEXTOUCH) 200 UNIT/ML SOPN Inject 40 Units into the skin at bedtime. (Patient taking differently: Inject 20 Units into the skin at bedtime. )    . levothyroxine (SYNTHROID, LEVOTHROID) 112 MCG tablet Take 112 mcg by mouth daily before breakfast. BRAND NAME SYNTHROID    . LYRICA 150 MG capsule Take 1 capsule (150 mg total) by  mouth at bedtime. (Patient taking differently: Take 150 mg by mouth 2 (two) times daily. )    . nitroGLYCERIN (NITRODUR - DOSED IN MG/24 HR) 0.2 mg/hr patch Place 1 patch (0.2 mg total) onto the skin daily. 30 patch 12  . OVER THE COUNTER MEDICATION Take 1 tablet by mouth 3 (three) times daily as needed (leg cramps). Hyland's Leg Cramps    . pentoxifylline (TRENTAL) 400 MG CR tablet TAKE 1 TABLET (400 MG TOTAL) BY MOUTH 3 (THREE) TIMES DAILY WITH MEALS. 270 tablet 1  . rosuvastatin (CRESTOR) 10 MG tablet Take 10 mg by mouth at bedtime.     . Semaglutide, 1 MG/DOSE, (OZEMPIC, 1 MG/DOSE,) 2 MG/1.5ML SOPN Inject 0.5 mg into the skin every Friday.     . triamcinolone (KENALOG) 0.025 % cream Apply 1 application topically daily as needed (psoriasis).     No current facility-administered medications for this visit.     ROS:  See HPI  Physical Exam:  Today's Vitals   05/10/19 1011  BP: 139/61  Pulse: 78  Resp: 18  Temp: (!) 96.5 F (35.8 C)  TempSrc: Temporal  Weight: 197 lb (89.4 kg)  Height: 5\' 10"  (1.778 m)   Body mass index is 28.27 kg/m.  Incision:  Well healed.  Extremities:  There is a palpable left radial pulse.  Motor and sensory are in tact.  There is a thrill/bruit present.  The left hand is slightly cooler than the right.    Dialysis Duplex on 05/10/2019: Diameter:  0.52cm-0.76cm Depth:  0.60cm-1.61cm Competing branches present   Assessment/Plan:  This is a 69 y.o. male who is s/p: Left 1st stage BVT on 03/25/2019 by Dr. Trula Slade  -the pt does have mild tingling and numbness in the tips of his fingers on the left hand.  I discussed steal sx with him in detail.  He states this is tolerable.  He knows that if he develops sores or motor difficulties, he will contact us sooner rather than later.   -his fistula is maturing nicely-we will schedule him for a 2nd stage left BVT by Dr. Trula Slade on a T/T  -If pt has tunneled dialysis catheter and the access has been used  successfully to the satisfaction of the dialysis center, the tunneled catheter can be removed at their discretion.   I discussed this with the pt and his wife (she was via telephone).    Leontine Locket, PA-C [facility] [phone]  Clinic MD:  Trula Slade

## 2019-05-09 ENCOUNTER — Other Ambulatory Visit: Payer: Self-pay

## 2019-05-09 DIAGNOSIS — N186 End stage renal disease: Secondary | ICD-10-CM

## 2019-05-10 ENCOUNTER — Ambulatory Visit (HOSPITAL_COMMUNITY)
Admission: RE | Admit: 2019-05-10 | Discharge: 2019-05-10 | Disposition: A | Discharge status: Home / Self Care | Payer: Medicare Other | Source: Ambulatory Visit | Attending: Surgery | Admitting: Surgery

## 2019-05-10 ENCOUNTER — Other Ambulatory Visit: Payer: Self-pay

## 2019-05-10 ENCOUNTER — Ambulatory Visit (INDEPENDENT_AMBULATORY_CARE_PROVIDER_SITE_OTHER): Payer: Self-pay | Admitting: Physician Assistant

## 2019-05-10 VITALS — BP 139/61 | HR 78 | Temp 96.5°F | Resp 18 | Ht 70.0 in | Wt 197.0 lb

## 2019-05-10 DIAGNOSIS — N186 End stage renal disease: Secondary | ICD-10-CM

## 2019-05-13 ENCOUNTER — Ambulatory Visit: Payer: Medicare Other | Attending: Internal Medicine

## 2019-05-13 ENCOUNTER — Encounter (HOSPITAL_BASED_OUTPATIENT_CLINIC_OR_DEPARTMENT_OTHER): Payer: Medicare Other | Attending: Internal Medicine | Admitting: Internal Medicine

## 2019-05-13 ENCOUNTER — Other Ambulatory Visit: Payer: Self-pay

## 2019-05-13 DIAGNOSIS — W19XXXA Unspecified fall, initial encounter: Secondary | ICD-10-CM | POA: Insufficient documentation

## 2019-05-13 DIAGNOSIS — E1151 Type 2 diabetes mellitus with diabetic peripheral angiopathy without gangrene: Secondary | ICD-10-CM | POA: Insufficient documentation

## 2019-05-13 DIAGNOSIS — I2510 Atherosclerotic heart disease of native coronary artery without angina pectoris: Secondary | ICD-10-CM | POA: Insufficient documentation

## 2019-05-13 DIAGNOSIS — Z923 Personal history of irradiation: Secondary | ICD-10-CM | POA: Diagnosis not present

## 2019-05-13 DIAGNOSIS — E1142 Type 2 diabetes mellitus with diabetic polyneuropathy: Secondary | ICD-10-CM | POA: Diagnosis not present

## 2019-05-13 DIAGNOSIS — E11621 Type 2 diabetes mellitus with foot ulcer: Secondary | ICD-10-CM | POA: Insufficient documentation

## 2019-05-13 DIAGNOSIS — Z951 Presence of aortocoronary bypass graft: Secondary | ICD-10-CM | POA: Diagnosis not present

## 2019-05-13 DIAGNOSIS — L97511 Non-pressure chronic ulcer of other part of right foot limited to breakdown of skin: Secondary | ICD-10-CM | POA: Diagnosis not present

## 2019-05-13 DIAGNOSIS — Z23 Encounter for immunization: Secondary | ICD-10-CM | POA: Insufficient documentation

## 2019-05-13 DIAGNOSIS — S81811A Laceration without foreign body, right lower leg, initial encounter: Secondary | ICD-10-CM | POA: Insufficient documentation

## 2019-05-13 DIAGNOSIS — L97521 Non-pressure chronic ulcer of other part of left foot limited to breakdown of skin: Secondary | ICD-10-CM | POA: Insufficient documentation

## 2019-05-13 DIAGNOSIS — Z8546 Personal history of malignant neoplasm of prostate: Secondary | ICD-10-CM | POA: Diagnosis not present

## 2019-05-13 LAB — GLUCOSE, CAPILLARY: Glucose-Capillary: 107 mg/dL — ABNORMAL HIGH (ref 70–99)
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
## 2019-05-13 NOTE — Progress Notes (Signed)
   Covid-19 Vaccination Clinic  Name:  [name]    MRN: [id number] DOB: 1951/02/02  05/13/2019  [name] was observed post Covid-19 immunization for 15 minutes without incidence. He was provided with Vaccine Information Sheet and instruction to access the V-Safe system.   [name] was instructed to call 911 with any severe reactions post vaccine: Marland Kitchen Difficulty breathing  . Swelling of your face and throat  . A fast heartbeat  . A bad rash all over your body  . Dizziness and weakness    Immunizations Administered    Name Date Dose VIS Date Route   Pfizer COVID-19 Vaccine 05/13/2019  5:22 PM 0.3 mL 03/19/2019 Intramuscular   Manufacturer: [place]   Lot: [id number]   [place]: [id number]

## 2019-05-13 NOTE — Progress Notes (Signed)
[name], [name] ([id number]) Visit Report for 05/13/2019 Debridement Details Patient Name: Date of Service: [name], [name] 05/13/2019 7:30 AM Medical Record [id number] Patient Account Number: [id number] Date of Birth/Sex: Treating RN: October 16, 1950 (69 y.o. M) Primary Care Provider: Fanny Bien Other Clinician: Referring Provider: Treating Provider/Extender:Zarie Kosiba, Gennette Pac, Rojelio Brenner in Treatment: 1 Debridement Performed for Wound #5 Right,Lateral Foot Assessment: Performed By: Physician Ricard Dillon., MD Debridement Type: Debridement Severity of Tissue Pre Limited to breakdown of skin Debridement: Level of Consciousness (Pre- Awake and Alert procedure): Pre-procedure Verification/Time Out Taken: Yes - 08:16 Start Time: 08:17 Pain Control: Lidocaine 4% Topical Solution Total Area Debrided (L x W): 1.1 (cm) x 0.6 (cm) = 0.66 (cm) Tissue and other material Viable, Non-Viable, Callus, Skin: Dermis , Fibrin/Exudate debrided: Level: Skin/Dermis Debridement Description: Selective/Open Wound Instrument: Curette Bleeding: Minimum Hemostasis Achieved: Pressure End Time: 08:20 Procedural Pain: 0 Post Procedural Pain: 0 Response to Treatment: Procedure was tolerated well Level of Consciousness Awake and Alert (Post-procedure): Post Debridement Measurements of Total Wound Length: (cm) 1.1 Width: (cm) 0.6 Depth: (cm) 0.1 Volume: (cm) 0.052 Character of Wound/Ulcer Post Improved Debridement: Severity of Tissue Post Debridement: Limited to breakdown of skin Post Procedure Diagnosis Same as Pre-procedure Electronic Signature(s) Signed: 05/13/2019 5:50:02 PM By: Linton Ham MD Entered By: Linton Ham on 05/13/2019 08:35:48 -------------------------------------------------------------------------------- HPI Details Patient Name: Date of Service: [name], [name] 05/13/2019 7:30 AM Medical Record [id number] Patient Account Number: [id number] Date of  Birth/Sex: Treating RN: 11-30-1950 (69 y.o. M) Primary Care Provider: Fanny Bien Other Clinician: Referring Provider: Treating Provider/Extender:Vessie Olmsted, Gennette Pac, Rojelio Brenner in Treatment: 1 History of Present Illness HPI Description: ADMISSION 05/04/2019 This is a 69 year old patient who spent a prolonged period of time in this clinic in 2014-2015 cared for by Dr. Jerline Pain with apparently predominantly wounds on the left plantar foot. This apparently healed up. He is a type II diabetic with a history of peripheral neuropathy and PAD. In September 2020 he ended up with a right fourth and fifth ray extensive amputation I believe by Dr. [name] for underlying osteomyelitis. Prior to the surgery he had arterial studies that were normal on the right with an ABI of 1.26 and triphasic waveforms and on the left noncompressible with triphasic waveforms. Unfortunately they did not do TBI's. In any case he apparently developed a nonhealing area on the right lateral foot. He has been applying Silvadene to this. He uses his own running shoe with a custom insert. He has an AFO on the left which is fixed in the other shoe. He is here for a second opinion of this wound. Unfortunately 2 days ago he also had a fall and he has a considerably long laceration across the mid tibia. The only Steri-Stripped this they did not go to the ER this was not sutured although it probably should have been Past medical history; type 2 diabetes with a history of PAD and peripheral neuropathy, prostate CA treated with radiation, cervical spondylosis, left foot ulcer, history of osteomyelitis coronary artery disease status post CABG in 2005 recent fistula creation in the left arm in preparation for the dialysis, gout and a history of venous insufficiency ABIs were not done in this clinic as they were done in September 1.26 on the right and 2.18 on the left noncompressible. TBI's were not done but his waveforms were  said to be triphasic bilaterally 05/13/2019; I reviewed the operative report from Dr. [name] on 12/14/2018. I think he went into the OR expecting to  just have amputation of the fourth and fifth toes but after incision the margins were still completely infected and necrotic therefore the surgery was extended to encompass the metatarsals. Tissue was sent for culture this showed E. coli I am not really sure about the duration of his antibiotics whether he received this at dialysis or not. The patient thinks his wound has been present ever since the surgery. This is on the remanent of the metatarsals laterally. Last week hyper granulated this week really healthy looking with an epithelialized margin He also has a traumatic laceration which was Steri-Stripped by [place] last week. This has open areas inferiorly but I did not remove the Steri-Strips this week. I am going to give this another week and then remove them next week and deal with what is there. Finally we got a phone call from the transplant people at [facility]. They want to know if this is going to be healed whether he can be delayed for 6 months on the list. Electronic Signature(s) Signed: 05/13/2019 5:50:02 PM By: Linton Ham MD Entered By: Linton Ham on 05/13/2019 08:40:30 -------------------------------------------------------------------------------- Physical Exam Details Patient Name: Date of Service: [name], [name] 05/13/2019 7:30 AM Medical Record [id number] Patient Account Number: [id number] Date of Birth/Sex: Treating RN: [birth year] (69 y.o. M) Primary Care Provider: Fanny Bien Other Clinician: Referring Provider: Treating Provider/Extender:Kerryann Allaire, Gennette Pac, Rojelio Brenner in Treatment: 1 Constitutional Patient is hypotensive. Manually in the right arm 90/40 when he left.Marland Kitchen Respirations regular, non-labored and within target range.. Temperature is normal and within the target range for the patient.. Somewhat pale  looking. Respiratory work of breathing is normal. Bilateral breath sounds are clear and equal in all lobes with no wheezes, rales or rhonchi.. Cardiovascular Soft midsystolic murmur. Notes Wound exam The area in question is on the lateral remanent of his right foot. This has a protruding area of bone which I think is probably the remanent of the fifth metatarsal however there is no palpable bone. The wound is small after last week's debridement of hyper granulation the surface of this looks healthy there is surrounding epithelialization. The laceration on the right mid tibia has an open area inferiorly. I am going to leave the Steri-Strips in place for another week we will remove them next week and deal with the results of this then Electronic Signature(s) Signed: 05/13/2019 5:50:02 PM By: Linton Ham MD Entered By: Linton Ham on 05/13/2019 08:42:32 -------------------------------------------------------------------------------- Physician Orders Details Patient Name: Date of Service: [name], [name] 05/13/2019 7:30 AM Medical Record [id number] Patient Account Number: [id number] Date of Birth/Sex: Treating RN: [birth year] (69 y.o. [name], [name] Primary Care Provider: Fanny Bien Other Clinician: Referring Provider: Treating Provider/Extender:Berea Majkowski, Gennette Pac, Rojelio Brenner in Treatment: 1 Verbal / Phone Orders: No Diagnosis Coding ICD-10 Coding Code Description E11.621 Type 2 diabetes mellitus with foot ulcer L97.511 Non-pressure chronic ulcer of other part of right foot limited to breakdown of skin E11.42 Type 2 diabetes mellitus with diabetic polyneuropathy S81.811D Laceration without foreign body, right lower leg, subsequent encounter Follow-up Appointments Return Appointment in 1 week. Dressing Change Frequency Change Dressing every other day. - leave steri-strips in place on right leg wound. Wound Cleansing May shower with protection. Primary  Wound Dressing Wound #5 Right,Lateral Foot Calcium Alginate with Silver Wound #6 Right,Anterior Lower Leg Calcium Alginate with Silver - to open areas. Other: - leave steri-strips in place. Secondary Dressing Wound #6 Right,Anterior Lower Leg ABD pad Edema Control Kerlix and Coban - Right Lower Extremity Off-Loading  Open toe surgical shoe to: - felt in shoe. Other: - surgical shoe to right foot Radiology X-ray, foot right - x-ray of right foot related non-healing diabetic foot ulcer looking for infection. CPT code - (ICD10 E11.621 - Type 2 diabetes mellitus with foot ulcer) Electronic Signature(s) Signed: 05/13/2019 5:50:02 PM By: Linton Ham MD Signed: 05/13/2019 5:56:18 PM By: Deon Pilling Entered By: Deon Pilling on 05/13/2019 08:26:42 -------------------------------------------------------------------------------- Prescription 05/13/2019 Patient Name: [name] Provider: Linton Ham MD Date of Birth: [birth year] NPI#: [id number] Sex: Jerilynn Mages DEA#: [id number] Phone #: [phone] License #: [id number] Patient Address: [place] [address] [place], [place] [address] [place], [place] [address] [phone] Allergies mushroom Reaction: nausea Severity: Moderate Provider's Orders X-ray, foot right - ICD10: E11.621 - x-ray of right foot related non-healing diabetic foot ulcer looking for infection. CPT code Signature(s): Date(s): Electronic Signature(s) Signed: 05/13/2019 5:50:02 PM By: Linton Ham MD Signed: 05/13/2019 5:56:18 PM By: Deon Pilling Entered By: Deon Pilling on 05/13/2019 08:26:42 --------------------------------------------------------------------------------  Problem List Details Patient Name: Date of Service: [name], [name] 05/13/2019 7:30 AM Medical Record [id number] Patient Account Number: [id number] Date of Birth/Sex: Treating RN: [birth year] (69 y.o. [name] Primary Care  Provider: Fanny Bien Other Clinician: Referring Provider: Treating Provider/Extender:Kieli Golladay, Gennette Pac, Rojelio Brenner in Treatment: 1 Active Problems ICD-10 Evaluated Encounter Code Description Active Date Today Diagnosis E11.621 Type 2 diabetes mellitus with foot ulcer 05/04/2019 No Yes L97.511 Non-pressure chronic ulcer of other part of right foot 05/04/2019 No Yes limited to breakdown of skin E11.42 Type 2 diabetes mellitus with diabetic polyneuropathy 05/04/2019 No Yes S81.811D Laceration without foreign body, right lower leg, 05/04/2019 No Yes subsequent encounter Inactive Problems Resolved Problems Electronic Signature(s) Signed: 05/13/2019 5:50:02 PM By: Linton Ham MD Entered By: Linton Ham on 05/13/2019 08:35:30 -------------------------------------------------------------------------------- Progress Note Details Patient Name: Date of Service: [name], [name] 05/13/2019 7:30 AM Medical Record [id number] Patient Account Number: [id number] Date of Birth/Sex: Treating RN: 06/15/1950 (69 y.o. M) Primary Care Provider: Fanny Bien Other Clinician: Referring Provider: Treating Provider/Extender:Toshika Parrow, Gennette Pac, Rojelio Brenner in Treatment: 1 Subjective History of Present Illness (HPI) ADMISSION 05/04/2019 This is a 69 year old patient who spent a prolonged period of time in this clinic in 2014-2015 cared for by Dr. Jerline Pain with apparently predominantly wounds on the left plantar foot. This apparently healed up. He is a type II diabetic with a history of peripheral neuropathy and PAD. In September 2020 he ended up with a right fourth and fifth ray extensive amputation I believe by Dr. [name] for underlying osteomyelitis. Prior to the surgery he had arterial studies that were normal on the right with an ABI of 1.26 and triphasic waveforms and on the left noncompressible with triphasic waveforms. Unfortunately they did not do TBI's. In any case  he apparently developed a nonhealing area on the right lateral foot. He has been applying Silvadene to this. He uses his own running shoe with a custom insert. He has an AFO on the left which is fixed in the other shoe. He is here for a second opinion of this wound. Unfortunately 2 days ago he also had a fall and he has a considerably long laceration across the mid tibia. The only Steri-Stripped this they did not go to the ER this was not sutured although it probably should have been Past medical history; type 2 diabetes with a history of PAD and peripheral neuropathy, prostate CA treated with radiation,  cervical spondylosis, left foot ulcer, history of osteomyelitis coronary artery disease status post CABG in 2005 recent fistula creation in the left arm in preparation for the dialysis, gout and a history of venous insufficiency ABIs were not done in this clinic as they were done in September 1.26 on the right and 2.18 on the left noncompressible. TBI's were not done but his waveforms were said to be triphasic bilaterally 05/13/2019; I reviewed the operative report from Dr. [name] on 12/14/2018. I think he went into the OR expecting to just have amputation of the fourth and fifth toes but after incision the margins were still completely infected and necrotic therefore the surgery was extended to encompass the metatarsals. Tissue was sent for culture this showed E. coli I am not really sure about the duration of his antibiotics whether he received this at dialysis or not. The patient thinks his wound has been present ever since the surgery. This is on the remanent of the metatarsals laterally. Last week hyper granulated this week really healthy looking with an epithelialized margin He also has a traumatic laceration which was Steri-Stripped by [place] last week. This has open areas inferiorly but I did not remove the Steri-Strips this week. I am going to give this another week and then remove them next week  and deal with what is there. Finally we got a phone call from the transplant people at [name]. They want to know if this is going to be healed whether he can be delayed for 6 months on the list. Objective Constitutional Patient is hypotensive. Manually in the right arm 90/40 when he left.Marland Kitchen Respirations regular, non-labored and within target range.. Temperature is normal and within the target range for the patient.. Somewhat pale looking. Vitals Time Taken: 8:00 AM, Height: 71 in, Source: Stated, Weight: 192 lbs, Source: Stated, BMI: 26.8, Temperature: 98.4 F, Pulse: 84 bpm, Respiratory Rate: 18 breaths/min, Blood Pressure: 72/44 mmHg, Capillary Blood Glucose: 107 mg/dl. General Notes: pt staggered in lobby and became lightheaded. assisted to sit, pt diaphoretic. stated he ate a small sandwich this morning but did not check his blood sugar. states having difficulty with low BP at dialysis.0813 manual BP 90/40, pt still with mild dizziness, feeling somewhat better. given water to drink Respiratory work of breathing is normal. Bilateral breath sounds are clear and equal in all lobes with no wheezes, rales or rhonchi.. Cardiovascular Soft midsystolic murmur. General Notes: Wound exam ooThe area in question is on the lateral remanent of his right foot. This has a protruding area of bone which I think is probably the remanent of the fifth metatarsal however there is no palpable bone. The wound is small after last week's debridement of hyper granulation the surface of this looks healthy there is surrounding epithelialization. ooThe laceration on the right mid tibia has an open area inferiorly. I am going to leave the Steri-Strips in place for another week we will remove them next week and deal with the results of this then Integumentary (Hair, Skin) Wound #5 status is Open. Original cause of wound was Surgical Injury. The wound is located on the Right,Lateral Foot. The wound measures 1.1cm  length x 0.6cm width x 0.1cm depth; 0.518cm^2 area and 0.052cm^3 volume. There is Fat Layer (Subcutaneous Tissue) Exposed exposed. There is no tunneling or undermining noted. There is a small amount of serosanguineous drainage noted. The wound margin is flat and intact. There is large (67-100%) red granulation within the wound bed. There is no necrotic tissue within  the wound bed. Wound #6 status is Open. Original cause of wound was Trauma. The wound is located on the Right,Anterior Lower Leg. The wound measures 2cm length x 0.8cm width x 0.1cm depth; 1.257cm^2 area and 0.126cm^3 volume. There is Fat Layer (Subcutaneous Tissue) Exposed exposed. There is no tunneling or undermining noted. There is a small amount of serosanguineous drainage noted. The wound margin is flat and intact. There is large (67-100%) red granulation within the wound bed. There is no necrotic tissue within the wound bed. Assessment Active Problems ICD-10 Type 2 diabetes mellitus with foot ulcer Non-pressure chronic ulcer of other part of right foot limited to breakdown of skin Type 2 diabetes mellitus with diabetic polyneuropathy Laceration without foreign body, right lower leg, subsequent encounter Procedures Wound #5 Pre-procedure diagnosis of Wound #5 is a Diabetic Wound/Ulcer of the Lower Extremity located on the Right,Lateral Foot .Severity of Tissue Pre Debridement is: Limited to breakdown of skin. There was a Selective/Open Wound Skin/Dermis Debridement with a total area of 0.66 sq cm performed by Ricard Dillon., MD. With the following instrument(s): Curette to remove Viable and Non-Viable tissue/material. Material removed includes Callus, Skin: Dermis, and Fibrin/Exudate after achieving pain control using Lidocaine 4% Topical Solution. A time out was conducted at 08:16, prior to the start of the procedure. A Minimum amount of bleeding was controlled with Pressure. The procedure was tolerated well with a pain  level of 0 throughout and a pain level of 0 following the procedure. Post Debridement Measurements: 1.1cm length x 0.6cm width x 0.1cm depth; 0.052cm^3 volume. Character of Wound/Ulcer Post Debridement is improved. Severity of Tissue Post Debridement is: Limited to breakdown of skin. Post procedure Diagnosis Wound #5: Same as Pre-Procedure Plan Follow-up Appointments: Return Appointment in 1 week. Dressing Change Frequency: Change Dressing every other day. - leave steri-strips in place on right leg wound. Wound Cleansing: May shower with protection. Primary Wound Dressing: Wound #5 Right,Lateral Foot: Calcium Alginate with Silver Wound #6 Right,Anterior Lower Leg: Calcium Alginate with Silver - to open areas. Other: - leave steri-strips in place. Secondary Dressing: Wound #6 Right,Anterior Lower Leg: ABD pad Edema Control: Kerlix and Coban - Right Lower Extremity Off-Loading: Open toe surgical shoe to: - felt in shoe. Other: - surgical shoe to right foot Radiology ordered were: X-ray, foot right - x-ray of right foot related non-healing diabetic foot ulcer looking for infection. CPT code 1. Extensive resection by Dr. [name] of infected bone on 12/15/2018. I have reviewed the operative record 2. I do not really see whether he had postoperative antibiotics for him or not. I do not see any recent x-rays of the area therefore I am going to go ahead and order an x-ray of the foot today. I am particularly interested in what I think is the remanent of the fifth metatarsal. His intraoperative culture grew E. coli which was cephalosporin, quinolone sensitive but ampicillin/sulbactam intermediate really resistant 3. The wound actually looks quite good today. Healthy granulation rim of epithelialization. Not much change in surface area however 4. We have communicated with the transplant list. I have asked him to give [place] a month to see if we can get this closed and to see if there is any  worrisome remanence of bone infection in his foot. The patient is anxious to proceed 5. The patient is hypotensive and looks a bit pasty in clinic today. His cardiorespiratory exam is normal he does not look septic. He tells [place] that he had problems yesterday with blood  pressure at dialysis. I wonder whether he has orthostatic hypotension/autonomic neuropathy secondary to his diabetic condition. He is mentating normally. Electronic Signature(s) Signed: 05/13/2019 5:50:02 PM By: Linton Ham MD Entered By: Linton Ham on 05/13/2019 08:45:20 -------------------------------------------------------------------------------- SuperBill Details Patient Name: Date of Service: [name], [name] 05/13/2019 Medical Record [id number] Patient Account Number: [id number] Date of Birth/Sex: Treating RN: Jun 07, 1950 (69 y.o. [name], [name] Primary Care Provider: Fanny Bien Other Clinician: Referring Provider: Treating Provider/Extender:Davit Vassar, Gennette Pac, Rojelio Brenner in Treatment: 1 Diagnosis Coding ICD-10 Codes Code Description E11.621 Type 2 diabetes mellitus with foot ulcer L97.511 Non-pressure chronic ulcer of other part of right foot limited to breakdown of skin E11.42 Type 2 diabetes mellitus with diabetic polyneuropathy S81.811D Laceration without foreign body, right lower leg, subsequent encounter Facility Procedures CPT4 Code Description: [id number] - DEBRIDE WOUND 1ST 20 SQ CM OR < ICD-10 Diagnosis Description L97.511 Non-pressure chronic ulcer of other part of right foot limited E11.621 Type 2 diabetes mellitus with foot ulcer Modifier: to breakdown o Quantity: 1 f skin Physician Procedures Electronic Signature(s) Signed: 05/13/2019 5:50:02 PM By: Linton Ham MD Entered By: Linton Ham on 05/13/2019 08:45:54

## 2019-05-14 ENCOUNTER — Ambulatory Visit: Admit: 2019-05-14 | Payer: Medicare Other | Admitting: Surgery

## 2019-05-14 ENCOUNTER — Ambulatory Visit: Payer: Medicare Other

## 2019-05-14 SURGERY — ARTERIOVENOUS (AV) FISTULA CREATION
Anesthesia: Choice

## 2019-05-18 ENCOUNTER — Other Ambulatory Visit (HOSPITAL_COMMUNITY)
Admission: RE | Admit: 2019-05-18 | Discharge: 2019-05-18 | Disposition: A | Discharge status: Home / Self Care | Payer: Medicare Other | Source: Ambulatory Visit | Attending: Surgery | Admitting: Surgery

## 2019-05-18 DIAGNOSIS — Z01812 Encounter for preprocedural laboratory examination: Secondary | ICD-10-CM | POA: Insufficient documentation

## 2019-05-18 DIAGNOSIS — Z20822 Contact with and (suspected) exposure to covid-19: Secondary | ICD-10-CM | POA: Diagnosis not present

## 2019-05-18 LAB — SARS CORONAVIRUS 2 (TAT 6-24 HRS): SARS Coronavirus 2: NEGATIVE

## 2019-05-19 ENCOUNTER — Encounter (HOSPITAL_COMMUNITY): Payer: Self-pay | Admitting: Surgery

## 2019-05-19 ENCOUNTER — Other Ambulatory Visit: Payer: Self-pay

## 2019-05-19 NOTE — Progress Notes (Signed)
Patient denies shortness of breath, fever, cough and chest pain.  PCP - Dr Rachell Cipro Cardiologist - Dr Adrian Prows Neuro - Dr Margette Fast  Chest x-ray - 04/17/19 (2V) EKG - 05/03/19 Stress Test - 05/28/13 ECHO - 06/08/13 Cardiac Cath - 02/18/2004  Fasting Blood Sugar - 90-100s Checks Blood Sugar 1 times a day  . THE NIGHT BEFORE SURGERY, do not take Tresiba insulin.  . If your blood sugar is less than 70 mg/dL, you will need to treat for low blood sugar: o Treat a low blood sugar (less than 70 mg/dL) with  cup of clear juice (cranberry or apple), 4 glucose tablets, OR glucose gel. o Recheck blood sugar in 15 minutes after treatment (to make sure it is greater than 70 mg/dL). If your blood sugar is not greater than 70 mg/dL on recheck, call [phone] for further instructions.  Anesthesia review: Yes  STOP now taking any Aspirin (unless otherwise instructed by your surgeon), Aleve, Naproxen, Ibuprofen, Motrin, Advil, Goody's, BC's, all herbal medications, fish oil, and all vitamins.   Coronavirus Screening Covid test on 05/18/19 was negative.  Patient verbalized understanding of instructions that were given via phone.

## 2019-05-19 NOTE — Progress Notes (Signed)
Anesthesia Chart Review: Same day workup  Follows with Dr. Einar Gip for hx of CAD s/p CABG 2005. Last seen 05/03/19. Per note his CKD had progressed to ESRD and he needed to start HD soon due to progressive symptoms. His carvedilol was reduced to allow for somewhat permissive hypertension due to pt having significant fatigue and weakness. Pt did subsequently have [place] placed and has started HD.  IDDMII, last A1c 6.3 on 12/14/18.  Will need DOS labs and eval.  EKG 05/18/2018: Sinus rhythm with first-degree AV block at the rate of 81 bpm, normal axis, incomplete right bundle branch block.  Nonspecific T abnormality. No significant change from EKG 05/19/2017.  Carotid artery duplex 05/07/2019:  Stenosis in the bilateral internal carotid artery (16-49%), lower end of  spectrum with heteregenous plaque.  Antegrade right vertebral artery flow. Antegrade left vertebral artery flow.  Follow up in one year is appropriate if clinically indicated. Compared to 01/27/2018, no significant change.  Echo 06/08/13: 1. Left ventricular cavity is normal in size. Mild concentric hypertrophy. Normal global wall motion. Normal systolic global function. Calculated EF 57%. Visual EF is 60-65%. 2. Left atrial cavity is mildly dilated. Mild aneurysmal motion of the interatrial septum. 3. Mild aortic valve leaflet calcification. 4. Mild calcification of the mitral annulus. Mild mitral regurgitation. 5. Tricuspid valve structurally normal. Mild tricuspid regurgitation.  Nuclear stress 05/28/2013: 1. Resting EKG NSR, Poor R wave progression. Low voltage complexes. Stress EKG was non diagnostic for ischemia. No ST-T changes of ischemia noted with pharmacologic stress testing.  2. The perfusion study demonstrated normal isotope uptake both at rest and stress. There was no evidence of ischemia or scar. Dynamic gated images reveal normal wall motion and endocardial thickening. Left ventricular ejection fraction was estimated to be  67%.  [name] [facility]/Anesthesiology Phone [phone] 05/19/2019 10:29 AM

## 2019-05-19 NOTE — Anesthesia Preprocedure Evaluation (Addendum)
Anesthesia Evaluation  Patient identified by MRN, date of birth, ID band Patient awake    Reviewed: Allergy & Precautions, NPO status   Airway Mallampati: II  TM Distance: >3 FB     Dental   Pulmonary former smoker,    breath sounds clear to auscultation       Cardiovascular hypertension, + CAD   Rhythm:Regular Rate:Normal     Neuro/Psych    GI/Hepatic GERD  ,  Endo/Other  diabetesHypothyroidism   Renal/GU Renal disease     Musculoskeletal   Abdominal   Peds  Hematology   Anesthesia Other Findings   Reproductive/Obstetrics                           Anesthesia Physical Anesthesia Plan  ASA: III  Anesthesia Plan: General   Post-op Pain Management:    Induction: Intravenous  PONV Risk Score and Plan: 2 and Ondansetron  Airway Management Planned: Nasal Cannula and Simple Face Mask  Additional Equipment:   Intra-op Plan:   Post-operative Plan:   Informed Consent: I have reviewed the patients History and Physical, chart, labs and discussed the procedure including the risks, benefits and alternatives for the proposed anesthesia with the patient or authorized representative who has indicated his/her understanding and acceptance.     Dental advisory given  Plan Discussed with: CRNA and Anesthesiologist  Anesthesia Plan Comments: (Follows with Dr. Einar Gip for hx of CAD s/p CABG 2005. Last seen 05/03/19. Per note his CKD had progressed to ESRD and he needed to start HD soon due to progressive symptoms. His carvedilol was reduced to allow for somewhat permissive hypertension due to pt having significant fatigue and weakness. Pt did subsequently have [place] placed and has started HD.  IDDMII, last A1c 6.3 on 12/14/18.  Will need DOS labs and eval.  EKG 05/18/2018: Sinus rhythm with first-degree AV block at the rate of 81 bpm, normal axis, incomplete right bundle branch block.  Nonspecific T  abnormality. No significant change from EKG 05/19/2017.  Carotid artery duplex 05/07/2019:  Stenosis in the bilateral internal carotid artery (16-49%), lower end of  spectrum with heteregenous plaque.  Antegrade right vertebral artery flow. Antegrade left vertebral artery flow.  Follow up in one year is appropriate if clinically indicated. Compared to 01/27/2018, no significant change.  Echo 06/08/13: 1. Left ventricular cavity is normal in size. Mild concentric hypertrophy. Normal global wall motion. Normal systolic global function. Calculated EF 57%. Visual EF is 60-65%. 2. Left atrial cavity is mildly dilated. Mild aneurysmal motion of the interatrial septum. 3. Mild aortic valve leaflet calcification. 4. Mild calcification of the mitral annulus. Mild mitral regurgitation. 5. Tricuspid valve structurally normal. Mild tricuspid regurgitation.  Nuclear stress 05/28/2013: 1. Resting EKG NSR, Poor R wave progression. Low voltage complexes. Stress EKG was non diagnostic for ischemia. No ST-T changes of ischemia noted with pharmacologic stress testing.  2. The perfusion study demonstrated normal isotope uptake both at rest and stress. There was no evidence of ischemia or scar. Dynamic gated images reveal normal wall motion and endocardial thickening. Left ventricular ejection fraction was estimated to be 67%.)      Anesthesia Quick Evaluation

## 2019-05-20 ENCOUNTER — Encounter (HOSPITAL_COMMUNITY): Payer: Self-pay | Admitting: Surgery

## 2019-05-20 ENCOUNTER — Encounter (HOSPITAL_COMMUNITY)
Admission: RE | Disposition: A | Discharge status: Home / Self Care | Payer: Self-pay | Source: Ambulatory Visit | Attending: Surgery

## 2019-05-20 ENCOUNTER — Telehealth: Payer: Self-pay | Admitting: *Deleted

## 2019-05-20 ENCOUNTER — Ambulatory Visit (HOSPITAL_COMMUNITY)
Admission: RE | Admit: 2019-05-20 | Discharge: 2019-05-20 | Disposition: A | Discharge status: Home / Self Care | Payer: Medicare Other | Source: Ambulatory Visit | Attending: Surgery | Admitting: Surgery

## 2019-05-20 ENCOUNTER — Ambulatory Visit (HOSPITAL_COMMUNITY): Payer: Medicare Other | Admitting: Physician Assistant

## 2019-05-20 ENCOUNTER — Other Ambulatory Visit: Payer: Self-pay

## 2019-05-20 ENCOUNTER — Encounter (HOSPITAL_BASED_OUTPATIENT_CLINIC_OR_DEPARTMENT_OTHER): Payer: Medicare Other | Admitting: Internal Medicine

## 2019-05-20 DIAGNOSIS — R202 Paresthesia of skin: Secondary | ICD-10-CM | POA: Diagnosis not present

## 2019-05-20 DIAGNOSIS — N184 Chronic kidney disease, stage 4 (severe): Secondary | ICD-10-CM

## 2019-05-20 DIAGNOSIS — Z79890 Hormone replacement therapy (postmenopausal): Secondary | ICD-10-CM | POA: Diagnosis not present

## 2019-05-20 DIAGNOSIS — Z794 Long term (current) use of insulin: Secondary | ICD-10-CM | POA: Diagnosis not present

## 2019-05-20 DIAGNOSIS — Z79899 Other long term (current) drug therapy: Secondary | ICD-10-CM | POA: Diagnosis not present

## 2019-05-20 DIAGNOSIS — N185 Chronic kidney disease, stage 5: Secondary | ICD-10-CM

## 2019-05-20 DIAGNOSIS — Z7982 Long term (current) use of aspirin: Secondary | ICD-10-CM | POA: Insufficient documentation

## 2019-05-20 DIAGNOSIS — N186 End stage renal disease: Secondary | ICD-10-CM | POA: Insufficient documentation

## 2019-05-20 HISTORY — PX: BASCILIC VEIN TRANSPOSITION: SHX5742

## 2019-05-20 HISTORY — DX: Dependence on other enabling machines and devices: Z99.89

## 2019-05-20 HISTORY — DX: Myoneural disorder, unspecified: G70.9

## 2019-05-20 LAB — POCT I-STAT, CHEM 8
BUN: 32 mg/dL — ABNORMAL HIGH (ref 8–23)
Calcium, Ion: 0.98 mmol/L — ABNORMAL LOW (ref 1.15–1.40)
Chloride: 95 mmol/L — ABNORMAL LOW (ref 98–111)
Creatinine, Ser: 5.6 mg/dL — ABNORMAL HIGH (ref 0.61–1.24)
Glucose, Bld: 129 mg/dL — ABNORMAL HIGH (ref 70–99)
HCT: 28 % — ABNORMAL LOW (ref 39.0–52.0)
Hemoglobin: 9.5 g/dL — ABNORMAL LOW (ref 13.0–17.0)
Potassium: 3.4 mmol/L — ABNORMAL LOW (ref 3.5–5.1)
Sodium: 137 mmol/L (ref 135–145)
TCO2: 30 mmol/L (ref 22–32)

## 2019-05-20 LAB — SURGICAL PCR SCREEN
MRSA, PCR: NEGATIVE
Staphylococcus aureus: NEGATIVE

## 2019-05-20 LAB — GLUCOSE, CAPILLARY
Glucose-Capillary: 136 mg/dL — ABNORMAL HIGH (ref 70–99)
Glucose-Capillary: 126 mg/dL — ABNORMAL HIGH (ref 70–99)

## 2019-05-20 SURGERY — TRANSPOSITION, VEIN, BASILIC
Anesthesia: Monitor Anesthesia Care | Site: Arm Upper | Laterality: Left

## 2019-05-20 MED ORDER — ONDANSETRON HCL 4 MG/2ML IJ SOLN
INTRAMUSCULAR | Status: AC
  Filled 2019-05-20: qty 2

## 2019-05-20 MED ORDER — 0.9 % SODIUM CHLORIDE (POUR BTL) OPTIME
TOPICAL | Status: DC | PRN
  Administered 2019-05-20: 07:00:00 1000 mL

## 2019-05-20 MED ORDER — ONDANSETRON HCL 4 MG/2ML IJ SOLN
INTRAMUSCULAR | Status: DC | PRN
  Administered 2019-05-20: 4 mg via INTRAVENOUS

## 2019-05-20 MED ORDER — LIDOCAINE-EPINEPHRINE (PF) 1 %-1:200000 IJ SOLN
INTRAMUSCULAR | Status: DC | PRN
  Administered 2019-05-20: 20 mL

## 2019-05-20 MED ORDER — FENTANYL CITRATE (PF) 100 MCG/2ML IJ SOLN: 25.0000 ug | INTRAMUSCULAR | Status: DC | PRN

## 2019-05-20 MED ORDER — CEFAZOLIN SODIUM-DEXTROSE 2-4 GM/100ML-% IV SOLN
2.0000 g | INTRAVENOUS | Status: AC
  Administered 2019-05-20: 2 g via INTRAVENOUS

## 2019-05-20 MED ORDER — PROPOFOL 500 MG/50ML IV EMUL
INTRAVENOUS | Status: DC | PRN
  Administered 2019-05-20: 100 ug/kg/min via INTRAVENOUS

## 2019-05-20 MED ORDER — FENTANYL CITRATE (PF) 100 MCG/2ML IJ SOLN
INTRAMUSCULAR | Status: AC
  Filled 2019-05-20: qty 2

## 2019-05-20 MED ORDER — FENTANYL CITRATE (PF) 100 MCG/2ML IJ SOLN
INTRAMUSCULAR | Status: DC | PRN
  Administered 2019-05-20: 25 ug via INTRAVENOUS

## 2019-05-20 MED ORDER — CEFAZOLIN SODIUM-DEXTROSE 2-4 GM/100ML-% IV SOLN
INTRAVENOUS | Status: AC
  Filled 2019-05-20: qty 100

## 2019-05-20 MED ORDER — SODIUM CHLORIDE 0.9 % IV SOLN
INTRAVENOUS | Status: DC | PRN
  Administered 2019-05-20: 500 mL

## 2019-05-20 MED ORDER — CHLORHEXIDINE GLUCONATE 4 % EX LIQD: 60.0000 mL | Freq: Once | CUTANEOUS | Status: DC

## 2019-05-20 MED ORDER — PROPOFOL 1000 MG/100ML IV EMUL
INTRAVENOUS | Status: AC
  Filled 2019-05-20: qty 100

## 2019-05-20 MED ORDER — SODIUM CHLORIDE 0.9 % IV SOLN
INTRAVENOUS | Status: AC
  Filled 2019-05-20: qty 1.2

## 2019-05-20 MED ORDER — MUPIROCIN 2 % EX OINT
TOPICAL_OINTMENT | CUTANEOUS | Status: AC
  Filled 2019-05-20: qty 22

## 2019-05-20 MED ORDER — LIDOCAINE 2% (20 MG/ML) 5 ML SYRINGE
INTRAMUSCULAR | Status: DC | PRN
  Administered 2019-05-20: 40 mg via INTRAVENOUS

## 2019-05-20 MED ORDER — MIDAZOLAM HCL 2 MG/2ML IJ SOLN
INTRAMUSCULAR | Status: AC
  Filled 2019-05-20: qty 2

## 2019-05-20 MED ORDER — HEMOSTATIC AGENTS (NO CHARGE) OPTIME
TOPICAL | Status: DC | PRN
  Administered 2019-05-20: 1 via TOPICAL

## 2019-05-20 MED ORDER — PHENYLEPHRINE HCL-NACL 10-0.9 MG/250ML-% IV SOLN
INTRAVENOUS | Status: DC | PRN
  Administered 2019-05-20: 15 ug/min via INTRAVENOUS

## 2019-05-20 MED ORDER — LIDOCAINE-EPINEPHRINE 1 %-1:100000 IJ SOLN
INTRAMUSCULAR | Status: AC
  Filled 2019-05-20: qty 2

## 2019-05-20 MED ORDER — FENTANYL CITRATE (PF) 250 MCG/5ML IJ SOLN
INTRAMUSCULAR | Status: AC
  Filled 2019-05-20: qty 5

## 2019-05-20 MED ORDER — PROPOFOL 10 MG/ML IV BOLUS
INTRAVENOUS | Status: AC
  Filled 2019-05-20: qty 20

## 2019-05-20 MED ORDER — SODIUM CHLORIDE 0.9 % IV SOLN: INTRAVENOUS | Status: DC

## 2019-05-20 MED ORDER — HYDROCODONE-ACETAMINOPHEN 5-325 MG PO TABS: 1.0000 | ORAL_TABLET | ORAL | 0 refills | Status: DC | PRN

## 2019-05-20 SURGICAL SUPPLY — 36 items
ARMBAND PINK RESTRICT EXTREMIT (MISCELLANEOUS) ×3 IMPLANT
CANISTER SUCT 3000ML PPV (MISCELLANEOUS) ×3 IMPLANT
CLIP VESOCCLUDE MED 24/CT (CLIP) IMPLANT
CLIP VESOCCLUDE MED 6/CT (CLIP) IMPLANT
CLIP VESOCCLUDE SM WIDE 24/CT (CLIP) IMPLANT
CLIP VESOCCLUDE SM WIDE 6/CT (CLIP) IMPLANT
COVER PROBE W GEL 5X96 (DRAPES) ×3 IMPLANT
COVER WAND RF STERILE (DRAPES) ×3 IMPLANT
DERMABOND ADVANCED (GAUZE/BANDAGES/DRESSINGS) ×2
DERMABOND ADVANCED .7 DNX12 (GAUZE/BANDAGES/DRESSINGS) ×1 IMPLANT
ELECT REM PT RETURN 9FT ADLT (ELECTROSURGICAL) ×3
ELECTRODE REM PT RTRN 9FT ADLT (ELECTROSURGICAL) ×1 IMPLANT
GLOVE BIOGEL PI IND STRL 7.5 (GLOVE) ×1 IMPLANT
GLOVE BIOGEL PI INDICATOR 7.5 (GLOVE) ×2
GLOVE SURG SS PI 7.5 STRL IVOR (GLOVE) ×3 IMPLANT
GOWN STRL REUS W/ TWL LRG LVL3 (GOWN DISPOSABLE) ×2 IMPLANT
GOWN STRL REUS W/ TWL XL LVL3 (GOWN DISPOSABLE) ×1 IMPLANT
GOWN STRL REUS W/TWL LRG LVL3 (GOWN DISPOSABLE) ×4
GOWN STRL REUS W/TWL XL LVL3 (GOWN DISPOSABLE) ×2
HEMOSTAT SNOW SURGICEL 2X4 (HEMOSTASIS) ×2 IMPLANT
KIT BASIN OR (CUSTOM PROCEDURE TRAY) ×3 IMPLANT
KIT TURNOVER KIT B (KITS) ×3 IMPLANT
NS IRRIG 1000ML POUR BTL (IV SOLUTION) ×3 IMPLANT
PACK CV ACCESS (CUSTOM PROCEDURE TRAY) ×3 IMPLANT
PAD ARMBOARD 7.5X6 YLW CONV (MISCELLANEOUS) ×6 IMPLANT
SUT PROLENE 6 0 CC (SUTURE) ×5 IMPLANT
SUT SILK 2 0 (SUTURE) ×2
SUT SILK 2 0 SH (SUTURE) IMPLANT
SUT SILK 2-0 18XBRD TIE 12 (SUTURE) IMPLANT
SUT VIC AB 3-0 SH 27 (SUTURE) ×4
SUT VIC AB 3-0 SH 27X BRD (SUTURE) ×1 IMPLANT
SUT VIC AB 4-0 PS2 18 (SUTURE) ×2 IMPLANT
SUT VICRYL 4-0 PS2 18IN ABS (SUTURE) ×3 IMPLANT
TOWEL GREEN STERILE (TOWEL DISPOSABLE) ×3 IMPLANT
UNDERPAD 30X30 (UNDERPADS AND DIAPERS) ×3 IMPLANT
WATER STERILE IRR 1000ML POUR (IV SOLUTION) ×3 IMPLANT

## 2019-05-20 NOTE — Interval H&P Note (Signed)
History and Physical Interval Note:  05/20/2019 7:23 AM  [name]  has presented today for surgery, with the diagnosis of END STAGE RENAL DISEASE.  The various methods of treatment have been discussed with the patient and family. After consideration of risks, benefits and other options for treatment, the patient has consented to  Procedure(s): [place] (Left) as a surgical intervention.  The patient's history has been reviewed, patient examined, no change in status, stable for surgery.  I have reviewed the patient's chart and labs.  Questions were answered to the patient's satisfaction.     Annamarie Major

## 2019-05-20 NOTE — Anesthesia Procedure Notes (Signed)
Procedure Name: MAC Date/Time: 05/20/2019 7:33 AM Performed by: Kyung Rudd, CRNA Pre-anesthesia Checklist: Patient identified, Emergency Drugs available, Suction available and Patient being monitored Patient Re-evaluated:Patient Re-evaluated prior to induction Oxygen Delivery Method: Simple face mask Induction Type: IV induction Placement Confirmation: positive ETCO2 Dental Injury: Teeth and Oropharynx as per pre-operative assessment

## 2019-05-20 NOTE — Transfer of Care (Signed)
Immediate Anesthesia Transfer of Care Note  Patient: [name]  Procedure(s) Performed: SECOND STAGE LEFT BASCILIC VEIN TRANSPOSITION (Left Arm Upper)  Patient Location: PACU  Anesthesia Type:MAC  Level of Consciousness: awake, alert  and oriented  Airway & Oxygen Therapy: Patient Spontanous Breathing  Post-op Assessment: Report given to RN, Post -op Vital signs reviewed and stable and Patient moving all extremities  Post vital signs: Reviewed and stable  Last Vitals:  Vitals Value Taken Time  BP 93/59 05/20/19 0907  Temp    Pulse 72 05/20/19 0908  Resp 17 05/20/19 0908  SpO2 100 % 05/20/19 0908  Vitals shown include unvalidated device data.  Last Pain:  Vitals:   05/20/19 0637  PainSc: 0-No pain         Complications: No apparent anesthesia complications

## 2019-05-20 NOTE — Op Note (Signed)
    Patient name: [name] MRN: [id number] DOB: January 13, 1951 Sex: male  05/20/2019 Pre-operative Diagnosis: ESRD Post-operative diagnosis:  Same Surgeon:  Annamarie Major Assistants:  2nd stage left basilic vein fistula Procedure:   [name] Anesthesia:  MAC Blood Loss:  minimal Specimens:  none  Findings:  Excellent caliber vein  Indications:  The patient comes in today for his 2nd stage fistula creation  Procedure:  The patient was identified in the holding area and taken to [place] [facility]  The patient was then placed supine on the table. MAC anesthesia was administered.  The patient was prepped and draped in the usual sterile fashion.  A time out was called and antibiotics were administered.  1% lidocaine was used for local anesthesia.  Ultrasound was used to evaluate the basilic vein which was widely patent and about 1cm.  2 longitudinal incisions were made in the upper arm.  The vein was circumferentially mobilized.  Side branches were ligated between silk ties.  The vein was marked for orientation.  Next, a curved Gore tunneler was used to create a subcutaneous tunnel.  The vein was then occluded with baby Gregory clamps.  It was transected near the antecubital crease and then brought through the previously created tunnel making sure to maintain proper orientation.  A end to end anastomosis was then created with 5-0 Prolene.  Prior to completion the appropriate flushing maneuvers were performed and the anastomosis was completed.  The clamps were released.  There was an excellent thrill within the fistula.  The fistula was then inspected to make sure there were no kinks.  The wound was then irrigated.  The incisions were closed with 2 layers of 3-0 Vicryl followed by Dermabond.  There were no immediate complications.   Disposition: To PACU stable.   Theotis Burrow, M.D., [facility] [place] Office: [phone] Pager:  [phone]

## 2019-05-20 NOTE — Anesthesia Postprocedure Evaluation (Signed)
Anesthesia Post Note  Patient: [name]  Procedure(s) Performed: SECOND STAGE LEFT BASCILIC VEIN TRANSPOSITION (Left Arm Upper)     Patient location during evaluation: PACU Anesthesia Type: MAC Level of consciousness: awake Pain management: pain level controlled Respiratory status: spontaneous breathing Cardiovascular status: stable Postop Assessment: no apparent nausea or vomiting Anesthetic complications: no    Last Vitals:  Vitals:   05/20/19 0917 05/20/19 0927  BP: (!) 104/52 (!) 115/57  Pulse: 77 70  Resp: 18 16  Temp:  36.5 C  SpO2: 100% 100%    Last Pain:  Vitals:   05/20/19 0927  PainSc: 0-No pain                 Harlen Danford

## 2019-05-20 NOTE — Telephone Encounter (Signed)
Per [employer] online portal no pa is needed for [phone].

## 2019-05-20 NOTE — Discharge Instructions (Signed)
° °  [facility] [place] ° °Discharge Instructions ° °AV Fistula or Graft Surgery for Dialysis Access ° °Please refer to the following instructions for your post-procedure care. Your surgeon or physician assistant will discuss any changes with you. ° °Activity ° °You may drive the day following your surgery, if you are comfortable and no longer taking prescription pain medication. Resume full activity as the soreness in your incision resolves. ° °Bathing/Showering ° °You may shower after you go home. Keep your incision dry for 48 hours. Do not soak in a bathtub, hot tub, or swim until the incision heals completely. You may not shower if you have a hemodialysis catheter. ° °Incision Care ° °Clean your incision with mild soap and water after 48 hours. Pat the area dry with a clean towel. You do not need a bandage unless otherwise instructed. Do not apply any ointments or creams to your incision. You may have skin glue on your incision. Do not peel it off. It will come off on its own in about one week. Your arm may swell a bit after surgery. To reduce swelling use pillows to elevate your arm so it is above your heart. Your doctor will tell you if you need to lightly wrap your arm with an ACE bandage. ° °Diet ° °Resume your normal diet. There are not special food restrictions following this procedure. In order to heal from your surgery, it is CRITICAL to get adequate nutrition. Your body requires vitamins, minerals, and protein. Vegetables are the best source of vitamins and minerals. Vegetables also provide the perfect balance of protein. Processed food has little nutritional value, so try to avoid this. ° °Medications ° °Resume taking all of your medications. If your incision is causing pain, you may take over-the counter pain relievers such as acetaminophen (Tylenol). If you were prescribed a stronger pain medication, please be aware these medications can cause nausea and constipation. Prevent  nausea by taking the medication with a snack or meal. Avoid constipation by drinking plenty of fluids and eating foods with high amount of fiber, such as fruits, vegetables, and grains. Do not take Tylenol if you are taking prescription pain medications. ° ° ° ° °Follow up °Your surgeon may want to see you in the office following your access surgery. If so, this will be arranged at the time of your surgery. ° °Please call us immediately for any of the following conditions: ° °Increased pain, redness, drainage (pus) from your incision site °Fever of 101 degrees or higher °Severe or worsening pain at your incision site °Hand pain or numbness. ° °Reduce your risk of vascular disease: ° °Stop smoking. If you would like help, call [facility] at 1-800-QUIT-NOW (1-800-784-8669) or [name] at 336-586-4000 ° °Manage your cholesterol °Maintain a desired weight °Control your diabetes °Keep your blood pressure down ° °Dialysis ° °It will take several weeks to several months for your new dialysis access to be ready for use. Your surgeon will determine when it is OK to use it. Your nephrologist will continue to direct your dialysis. You can continue to use your Permcath until your new access is ready for use. ° °If you have any questions, please call the office at [phone]. ° °

## 2019-05-21 ENCOUNTER — Other Ambulatory Visit (HOSPITAL_COMMUNITY): Payer: Self-pay | Admitting: Internal Medicine

## 2019-05-21 ENCOUNTER — Encounter (HOSPITAL_BASED_OUTPATIENT_CLINIC_OR_DEPARTMENT_OTHER): Payer: Medicare Other | Admitting: Internal Medicine

## 2019-05-21 ENCOUNTER — Ambulatory Visit (HOSPITAL_COMMUNITY)
Admission: RE | Admit: 2019-05-21 | Discharge: 2019-05-21 | Disposition: A | Discharge status: Home / Self Care | Payer: Medicare Other | Source: Ambulatory Visit | Attending: Internal Medicine | Admitting: Internal Medicine

## 2019-05-21 DIAGNOSIS — B999 Unspecified infectious disease: Secondary | ICD-10-CM

## 2019-05-21 DIAGNOSIS — E11621 Type 2 diabetes mellitus with foot ulcer: Secondary | ICD-10-CM | POA: Diagnosis not present

## 2019-05-24 NOTE — Progress Notes (Signed)
DUFF, POZZI ([id number]) Visit Report for 05/21/2019 Arrival Information Details Patient Name: Date of Service: [name], [name] 05/21/2019 3:00 PM Medical Record [id number] Patient Account Number: [id number] Date of Birth/Sex: Treating RN: 1950-07-19 (69 y.o. [name], [name] Primary Care Rosalva Neary: Fanny Bien Other Clinician: Referring Jarielys Girardot: Treating Cru Kritikos/Extender:Robson, Gennette Pac, Rojelio Brenner in Treatment: 2 Visit Information History Since Last Visit All ordered tests and consults were completed: Yes Patient Arrived: Kasandra Knudsen Added or deleted any medications: No Arrival Time: 15:10 Any new allergies or adverse reactions: No Accompanied By: wife Had a fall or experienced change in No Transfer Assistance: None activities of daily living that may affect Patient Identification Verified: Yes risk of falls: Secondary Verification Process Completed: Yes Signs or symptoms of abuse/neglect since last No Patient Requires Transmission-Based No visito Precautions: Hospitalized since last visit: No Patient Has Alerts: Yes Implantable device outside of the clinic excluding No Patient Alerts: R ABI = cellular tissue based products placed in the center 1.26 since last visit: L ABI = Has Dressing in Place as Prescribed: Yes 2.18 Pain Present Now: Yes Electronic Signature(s) Signed: 05/21/2019 5:41:48 PM By: Deon Pilling Entered By: Deon Pilling on 05/21/2019 15:20:53 -------------------------------------------------------------------------------- Clinic Level of Care Assessment Details Patient Name: Date of Service: [name], [name] 05/21/2019 3:00 PM Medical Record [id number] Patient Account Number: [id number] Date of Birth/Sex: Treating RN: [birth year] (69 y.o. [name] Primary Care Earon Rivest: Fanny Bien Other Clinician: Referring Alexiss Iturralde: Treating Valree Feild/Extender:Robson, Gennette Pac, Rojelio Brenner in Treatment: 2 Clinic Level  of Care Assessment Items TOOL 4 Quantity Score X - Use when only an EandM is performed on FOLLOW-UP visit 1 0 ASSESSMENTS - Nursing Assessment / Reassessment X - Reassessment of Co-morbidities (includes updates in patient status) 1 10 X - Reassessment of Adherence to Treatment Plan 1 5 ASSESSMENTS - Wound and Skin Assessment / Reassessment X - Simple Wound Assessment / Reassessment - one wound 1 5 []  - Complex Wound Assessment / Reassessment - multiple wounds 0 []  - Dermatologic / Skin Assessment (not related to wound area) 0 ASSESSMENTS - Focused Assessment X - Circumferential Edema Measurements - multi extremities 1 5 []  - Nutritional Assessment / Counseling / Intervention 0 []  - Lower Extremity Assessment (monofilament, tuning fork, pulses) 0 []  - Peripheral Arterial Disease Assessment (using hand held doppler) 0 ASSESSMENTS - Ostomy and/or Continence Assessment and Care []  - Incontinence Assessment and Management 0 []  - Ostomy Care Assessment and Management (repouching, etc.) 0 PROCESS - Coordination of Care X - Simple Patient / Family Education for ongoing care 1 15 []  - Complex (extensive) Patient / Family Education for ongoing care 0 X - Staff obtains Programmer, systems, Records, Test Results / Process Orders 1 10 []  - Staff telephones HHA, Nursing Homes / Clarify orders / etc 0 []  - Routine Transfer to another Facility (non-emergent condition) 0 []  - Routine Hospital Admission (non-emergent condition) 0 []  - New Admissions / Biomedical engineer / Ordering NPWT, Apligraf, etc. 0 []  - Emergency Hospital Admission (emergent condition) 0 X - Simple Discharge Coordination 1 10 []  - Complex (extensive) Discharge Coordination 0 PROCESS - Special Needs []  - Pediatric / Minor Patient Management 0 []  - Isolation Patient Management 0 []  - Hearing / Language / Visual special needs 0 []  - Assessment of Community assistance (transportation, D/C planning, etc.) 0 []  - Additional assistance /  Altered mentation 0 []  - Support Surface(s) Assessment (bed, cushion, seat, etc.) 0 INTERVENTIONS - Wound Cleansing / Measurement []  - Simple Wound Cleansing -  one wound 0 X - Complex Wound Cleansing - multiple wounds 2 5 X - Wound Imaging (photographs - any number of wounds) 1 5 []  - Wound Tracing (instead of photographs) 0 []  - Simple Wound Measurement - one wound 0 X - Complex Wound Measurement - multiple wounds 2 5 INTERVENTIONS - Wound Dressings X - Small Wound Dressing one or multiple wounds 2 10 []  - Medium Wound Dressing one or multiple wounds 0 []  - Large Wound Dressing one or multiple wounds 0 []  - Application of Medications - topical 0 []  - Application of Medications - injection 0 INTERVENTIONS - Miscellaneous []  - External ear exam 0 []  - Specimen Collection (cultures, biopsies, blood, body fluids, etc.) 0 []  - Specimen(s) / Culture(s) sent or taken to Lab for analysis 0 []  - Patient Transfer (multiple staff / Civil Service fast streamer / Similar devices) 0 []  - Simple Staple / Suture removal (25 or less) 0 []  - Complex Staple / Suture removal (26 or more) 0 []  - Hypo / Hyperglycemic Management (close monitor of Blood Glucose) 0 []  - Ankle / Brachial Index (ABI) - do not check if billed separately 0 X - Vital Signs 1 5 Has the patient been seen at the hospital within the last three years: Yes Total Score: 110 Level Of Care: New/Established - Level 3 Electronic Signature(s) Signed: 05/24/2019 6:12:41 PM By: Kela Millin Entered By: Kela Millin on 05/21/2019 15:39:37 -------------------------------------------------------------------------------- Encounter Discharge Information Details Patient Name: Date of Service: [name], [name] 05/21/2019 3:00 PM Medical Record [id number] Patient Account Number: [id number] Date of Birth/Sex: Treating RN: 11-Jul-1950 (69 y.o. [name] Primary Care Kaden Daughdrill: Fanny Bien Other Clinician: Referring Brady Plant: Treating  Amiliah Campisi/Extender:Robson, Gennette Pac, Rojelio Brenner in Treatment: 2 Encounter Discharge Information Items Discharge Condition: Stable Ambulatory Status: Cane Discharge Destination: Home Transportation: Private Auto Accompanied By: self Schedule Follow-up Appointment: Yes Clinical Summary of Care: Electronic Signature(s) Signed: 05/21/2019 5:41:48 PM By: Deon Pilling Entered By: Deon Pilling on 05/21/2019 15:59:40 -------------------------------------------------------------------------------- Lower Extremity Assessment Details Patient Name: Date of Service: [name], [name] 05/21/2019 3:00 PM Medical Record [id number] Patient Account Number: [id number] Date of Birth/Sex: Treating RN: 1950/07/19 (69 y.o. [name] Primary Care Ernestyne Caldwell: Fanny Bien Other Clinician: Referring Davie Sagona: Treating Liberty Seto/Extender:Robson, Gennette Pac, Rojelio Brenner in Treatment: 2 Edema Assessment Assessed: [Left: No] [Right: Yes] Edema: [Left: N] [Right: o] Calf Left: Right: Point of Measurement: cm From Medial Instep cm 33 cm Ankle Left: Right: Point of Measurement: cm From Medial Instep cm 20 cm Electronic Signature(s) Signed: 05/21/2019 5:41:48 PM By: Deon Pilling Entered By: Deon Pilling on 05/21/2019 15:21:57 -------------------------------------------------------------------------------- Multi Wound Chart Details Patient Name: Date of Service: [name], [name] 05/21/2019 3:00 PM Medical Record [id number] Patient Account Number: [id number] Date of Birth/Sex: Treating RN: [birth year] (69 y.o. [name] Primary Care Kolyn Rozario: Fanny Bien Other Clinician: Referring Arkeem Harts: Treating Yaretzy Olazabal/Extender:Robson, Gennette Pac, Rojelio Brenner in Treatment: 2 Vital Signs Height(in): 71 Capillary Blood 135 Glucose(mg/dl): Weight(lbs): 192 Pulse(bpm): 60 Body Mass Index(BMI): 27 Blood Pressure(mmHg): 97/45 Temperature(F):  98.5 Respiratory 16 Rate(breaths/min): Photos: [5:No Photos] [6:No Photos] [7:No Photos] Wound Location: [5:Right Foot - Lateral] [6:Right, Anterior Lower Leg Left Toe Second] Wounding Event: [5:Surgical Injury] [6:Trauma] [7:Gradually Appeared] Primary Etiology: [5: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] [7:Diabetic Wound/Ulcer of the Lower Extremity] Secondary Etiology: [5:Open Surgical Wound] [6:N/A] [7:N/A] Comorbid History: [5:Coronary Artery Disease, N/A Hypertension, Type II Diabetes, End Stage Renal Disease, Gout, Osteomyelitis, Neuropathy, Received Radiation, Anorexia/bulimia, Confinement Anxiety] [7:Coronary Artery Disease,  Hypertension, Type II  Diabetes, End Stage Renal Disease, Gout, Osteomyelitis, Neuropathy, Received Radiation, Anorexia/bulimia, Confinement Anxiety] Date Acquired: [5:05/02/2019] [6:05/02/2019] [7:05/13/2019] Weeks of Treatment: [5:2] [6:2] [7:0] Wound Status: [5:Open] [6:Healed - Epithelialized] [7:Open] Measurements L x W x D 0.6x0.3x0.1 [6:0x0x0] [7:0.3x0.4x0.1] (cm) Area (cm) : [5:0.141] [6:0] [7:0.094] Volume (cm) : [5:0.014] [6:0] [7:0.009] % Reduction in Area: [5:90.00%] [6:100.00%] [7:N/A] % Reduction in Volume: 90.10% [6:100.00%] [7:N/A] Classification: [5:Grade 2] [6:Full Thickness Without Exposed Support Structures] [7:Grade 1] Exudate Amount: [5:Small] [6:N/A] [7:Medium] Exudate Type: [5:Serosanguineous] [6:N/A] [7:Serosanguineous] Exudate Color: [5:red, brown] [6:N/A] [7:red, brown] Wound Margin: [5:Flat and Intact] [6:N/A] [7:Distinct, outline attached] Granulation Amount: [5:Large (67-100%)] [6:N/A] [7:Large (67-100%)] Granulation Quality: [5:Red, Pink] [6:N/A] [7:Red, Pink] Necrotic Amount: [5:None Present (0%)] [6:N/A] [7:Small (1-33%)] Exposed Structures: [5:Fat Layer (Subcutaneous N/A Tissue) Exposed: Yes Fascia: No Tendon: No Muscle: No Joint: No Bone: No Large (67-100%)] [6:N/A] [7:Fascia: No Fat Layer (Subcutaneous Tissue)  Exposed: No Tendon: No Muscle: No Joint: No Bone: No Small (1-33%)] Treatment Notes Wound #5 (Right, Lateral Foot) 1. Cleanse With Wound Cleanser Soap and water 3. Primary Dressing Applied Calcium Alginate Ag 4. Secondary Dressing Dry Gauze 6. Support Layer Applied Kerlix/Coban Notes netting Wound #7 (Left Toe Second) 1. Cleanse With Wound Cleanser 3. Primary Dressing Applied Calcium Alginate Ag 4. Secondary Dressing Dry Gauze Roll Gauze 5. Secured With [employer]) Signed: 05/21/2019 5:45:04 PM By: Linton Ham MD Signed: 05/24/2019 6:12:41 PM By: Kela Millin Entered By: Linton Ham on 05/21/2019 16:22:44 -------------------------------------------------------------------------------- [facility] Details Patient Name: Date of Service: [name], [name] 05/21/2019 3:00 PM Medical Record [id number] Patient Account Number: [id number] Date of Birth/Sex: Treating RN: Sep 17, 1950 (69 y.o. [name] Primary Care Cristian Davitt: Fanny Bien Other Clinician: Referring Jenna Routzahn: Treating Izabela Ow/Extender:Robson, Gennette Pac, Rojelio Brenner in Treatment: 2 Active Inactive Abuse / Safety / Falls / Self Care Management Nursing Diagnoses: Potential for injury related to falls Goals: Patient will not experience any injury related to falls Date Initiated: 05/04/2019 Target Resolution Date: 06/11/2019 Goal Status: Active Interventions: Assess Activities of Daily Living upon admission and as needed Assess fall risk on admission and as needed Assess: immobility, friction, shearing, incontinence upon admission and as needed Assess impairment of mobility on admission and as needed per policy Assess personal safety and home safety (as indicated) on admission and as needed Assess self care needs on admission and as needed Notes: Wound/Skin Impairment Nursing Diagnoses: Knowledge deficit related to  ulceration/compromised skin integrity Goals: Patient/caregiver will verbalize understanding of skin care regimen Date Initiated: 05/04/2019 Target Resolution Date: 06/11/2019 Goal Status: Active Ulcer/skin breakdown will have a volume reduction of 30% by week 4 Date Initiated: 05/04/2019 Target Resolution Date: 06/04/2019 Goal Status: Active Interventions: Assess patient/caregiver ability to obtain necessary supplies Assess patient/caregiver ability to perform ulcer/skin care regimen upon admission and as needed Assess ulceration(s) every visit Notes: Electronic Signature(s) Signed: 05/24/2019 6:12:41 PM By: Kela Millin Entered By: Kela Millin on 05/21/2019 15:38:25 -------------------------------------------------------------------------------- Pain Assessment Details Patient Name: Date of Service: [name], [name] 05/21/2019 3:00 PM Medical Record [id number] Patient Account Number: [id number] Date of Birth/Sex: Treating RN: 02-19-1951 (69 y.o. [name] Primary Care Ashonte Angelucci: Fanny Bien Other Clinician: Referring Romolo Sieling: Treating Deairra Halleck/Extender:Robson, Gennette Pac, Rojelio Brenner in Treatment: 2 Active Problems Location of Pain Severity and Description of Pain Patient Has Paino Yes Site Locations Pain Location: Pain in Ulcers Rate the pain. Current Pain Level: 7 Worst Pain Level: 10 Least Pain Level: 0 Tolerable Pain Level: 8  Character of Pain Describe the Pain: Aching, Sharp Pain Management and Medication Current Pain Management: Medication: Yes Cold Application: No Rest: Yes Massage: No Activity: No T.E.N.S.: No Heat Application: No Leg drop or elevation: Yes Is the Current Pain Management Adequate: Adequate How does your wound impact your activities of daily livingo Sleep: Yes Bathing: No Appetite: No Relationship With Others: No Bladder Continence: No Emotions: No Bowel Continence: No Work: No Toileting: No Drive:  No Dressing: No Hobbies: Yes Electronic Signature(s) Signed: 05/21/2019 5:41:48 PM By: Deon Pilling Entered By: Deon Pilling on 05/21/2019 15:21:48 -------------------------------------------------------------------------------- Patient/Caregiver Education Details Patient Name: Date of Service: [name], [name] 2/12/2021andnbsp3:00 PM Medical Record [id number] Patient Account Number: [id number] Date of Birth/Gender: Treating RN: Sep 01, 1950 (69 y.o. [name] Primary Care Physician: Fanny Bien Other Clinician: Referring Physician: Treating Physician/Extender:Robson, Gennette Pac, Rojelio Brenner in Treatment: 2 Education Assessment Education Provided To: Patient Education Topics Provided Wound/Skin Impairment: Methods: Explain/Verbal Responses: State content correctly Electronic Signature(s) Signed: 05/24/2019 6:12:41 PM By: Kela Millin Entered By: Kela Millin on 05/21/2019 15:38:37 -------------------------------------------------------------------------------- Wound Assessment Details Patient Name: Date of Service: [name], [name] 05/21/2019 3:00 PM Medical Record [id number] Patient Account Number: [id number] Date of Birth/Sex: Treating RN: 09/18/1950 (70 y.o. [name] Primary Care Cris Talavera: Fanny Bien Other Clinician: Referring Teddie Curd: Treating Paxon Propes/Extender:Robson, Gennette Pac, Rojelio Brenner in Treatment: 2 Wound Status Wound Number: 5 Primary Diabetic Wound/Ulcer of the Lower Extremity Etiology: Wound Location: Right Foot - Lateral Secondary Open Surgical Wound Wounding Event: Surgical Injury Etiology: Date Acquired: 05/02/2019 Wound Open Weeks Of Treatment: 2 Status: Clustered Wound: No Comorbid Coronary Artery Disease, Hypertension, Type History: II Diabetes, End Stage Renal Disease, Gout, Osteomyelitis, Neuropathy, Received Radiation, Anorexia/bulimia, Confinement Anxiety Photos Wound  Measurements Length: (cm) 0.6 Width: (cm) 0.3 Depth: (cm) 0.1 Area: (cm) 0.141 Volume: (cm) 0.014 Wound Description Classification: Grade 2 Wound Margin: Flat and Intact Exudate Amount: Small Exudate Type: Serosanguineous Exudate Color: red, brown Wound Bed Granulation Amount: Large (67-100%) Granulation Quality: Red, Pink Necrotic Amount: None Present (0%) Foul Odor After Cleansing: No Slough/Fibrino Yes Exposed Structure Fascia Exposed: No Fat Layer (Subcutaneous Tissue) Exposed: Ye Tendon Exposed: No Muscle Exposed: No Joint Exposed: No Bone Exposed: No % Reduction in Area: 90% % Reduction in Volume: 90.1% Epithelialization: Large (67-100%) Tunneling: No Undermining: No s Treatment Notes Wound #5 (Right, Lateral Foot) 1. Cleanse With Wound Cleanser Soap and water 3. Primary Dressing Applied Calcium Alginate Ag 4. Secondary Dressing Dry Gauze 6. Support Layer Applied Kerlix/Coban Notes Horticulturist, commercial) Signed: 05/24/2019 4:24:03 PM By: Mikeal Hawthorne EMT/HBOT Signed: 05/24/2019 6:12:41 PM By: Kela Millin Previous Signature: 05/21/2019 5:41:48 PM Version By: Deon Pilling Entered By: Mikeal Hawthorne on 05/24/2019 15:54:01 -------------------------------------------------------------------------------- Wound Assessment Details Patient Name: Date of Service: [name], [name] 05/21/2019 3:00 PM Medical Record [id number] Patient Account Number: [id number] Date of Birth/Sex: Treating RN: December 22, 1950 (69 y.o. [name], [name] Primary Care Chihiro Frey: Fanny Bien Other Clinician: Referring Ernesto Zukowski: Treating Zylee Marchiano/Extender:Robson, Gennette Pac, Rojelio Brenner in Treatment: 2 Wound Status Wound Number: 6 Primary Abrasion Etiology: Wound Location: Right Lower Leg - Anterior Wound Healed - Epithelialized Wounding Event: Trauma Status: Date Acquired: 05/02/2019 Comorbid Coronary Artery Disease, Hypertension, Type II Weeks Of  Treatment: 2 History: Diabetes, End Stage Renal Disease, Gout, Clustered Wound: No Osteomyelitis, Neuropathy, Received Radiation, Anorexia/bulimia, Confinement Anxiety Photos Wound Measurements Length: (cm) 0 % Reduct Width: (cm) 0 % Reduct Depth: (cm) 0 Epitheli Area: (cm) 0 Volume: (cm) 0 Wound Description Classification: Full Thickness  Without Exposed Support Foul Odo Structures Slough/F Wound Flat and Intact Margin: Exudate Small Amount: Exudate Serosanguineous Type: Exudate red, brown Color: Wound Bed Granulation Amount: Large (67-100%) Granulation Quality: Red Fascia E Necrotic Amount: None Present (0%) Fat Laye Tendon E Muscle E Joint Ex Bone Exp r After Cleansing: No ibrino No Exposed Structure xposed: No r (Subcutaneous Tissue) Exposed: Yes xposed: No xposed: No posed: No osed: No ion in Area: 100% ion in Volume: 100% alization: Medium (34-66%) Electronic Signature(s) Signed: 05/24/2019 4:24:03 PM By: Mikeal Hawthorne EMT/HBOT Signed: 05/24/2019 6:19:44 PM By: Deon Pilling Previous Signature: 05/21/2019 5:41:48 PM Version By: Deon Pilling Entered By: Mikeal Hawthorne on 05/24/2019 15:53:41 -------------------------------------------------------------------------------- Wound Assessment Details Patient Name: Date of Service: [name], [name] 05/21/2019 3:00 PM Medical Record [id number] Patient Account Number: [id number] Date of Birth/Sex: Treating RN: 1951-04-08 (69 y.o. [name] Primary Care Esco Joslyn: Fanny Bien Other Clinician: Referring Anastazia Creek: Treating Aanyah Loa/Extender:Robson, Gennette Pac, Rojelio Brenner in Treatment: 2 Wound Status Wound Number: 7 Primary Diabetic Wound/Ulcer of the Lower Extremity Etiology: Wound Location: Left Toe Second Wound Open Wounding Event: Gradually Appeared Status: Date Acquired: 05/13/2019 Comorbid Coronary Artery Disease, Hypertension, Type II Weeks Of Treatment: 0 History:  Diabetes, End Stage Renal Disease, Gout, Clustered Wound: No Osteomyelitis, Neuropathy, Received Radiation, Anorexia/bulimia, Confinement Anxiety Photos Wound Measurements Length: (cm) 0.3 % Reduction i Width: (cm) 0.4 % Reduction i Depth: (cm) 0.1 Epithelializa Area: (cm) 0.094 Tunneling: Volume: (cm) 0.009 Undermining: Wound Description Classification: Grade 1 Foul Odor Af Wound Margin: Distinct, outline attached Slough/Fibri Exudate Amount: Medium Exudate Type: Serosanguineous Exudate Color: red, brown Wound Bed Granulation Amount: Large (67-100%) Granulation Quality: Red, Pink Fascia Expos Necrotic Amount: Small (1-33%) Fat Layer (S Necrotic Quality: Adherent Slough Tendon Expos Muscle Expos Joint Expose Bone Exposed Treatment Notes Wound #7 (Left Toe Second) 1. Cleanse With Wound Cleanser 3. Primary Dressing Applied Calcium Alginate Ag 4. Secondary Dressing Dry Gauze Roll Gauze 5. Secured With Medipore tape ter Cleansing: No no Yes Exposed Structure ed: No ubcutaneous Tissue) Exposed: No ed: No ed: No d: No : No n Area: 0% n Volume: 0% tion: Small (1-33%) No No Electronic Signature(s) Signed: 05/24/2019 4:24:03 PM By: Mikeal Hawthorne EMT/HBOT Signed: 05/24/2019 6:12:41 PM By: Kela Millin Previous Signature: 05/21/2019 5:41:48 PM Version By: Deon Pilling Entered By: Mikeal Hawthorne on 05/24/2019 15:54:25 -------------------------------------------------------------------------------- Vitals Details Patient Name: Date of Service: [name], BOWKER 05/21/2019 3:00 PM Medical Record [id number] Patient Account Number: [id number] Date of Birth/Sex: Treating RN: 10/26/1950 (69 y.o. [name], [name] Primary Care Swain Acree: Fanny Bien Other Clinician: Referring Shiara Mcgough: Treating Chaysen Tillman/Extender:Robson, Gennette Pac, Rojelio Brenner in Treatment: 2 Vital Signs Time Taken: 15:10 Temperature (F): 98.5 Height (in): 71 Pulse (bpm):  87 Weight (lbs): 192 Respiratory Rate (breaths/min): 16 Body Mass Index (BMI): 26.8 Blood Pressure (mmHg): 97/45 Capillary Blood Glucose (mg/dl): 135 Reference Range: 80 - 120 mg / dl Electronic Signature(s) Signed: 05/21/2019 5:41:48 PM By: Deon Pilling Entered By: Deon Pilling on 05/21/2019 15:21:23

## 2019-05-24 NOTE — Progress Notes (Signed)
Jon, [name] ([id number]) Visit Report for 05/21/2019 HPI Details Patient Name: Date of Service: [name], [name] 05/21/2019 3:00 PM Medical Record [id number] Patient Account Number: [id number] Date of Birth/Sex: Treating RN: 1950/04/17 (69 y.o. [name] Primary Care Provider: Fanny [name] Other Clinician: Referring Provider: Treating Provider/Extender:[name], [name], [name] in Treatment: 2 History of Present Illness HPI Description: ADMISSION 05/04/2019 This is a 69 year old patient who spent a prolonged period of time in this clinic in 2014-2015 cared for by Dr. Jerline Pain with apparently predominantly wounds on the left plantar foot. This apparently healed up. He is a type II diabetic with a history of peripheral neuropathy and PAD. In September 2020 he ended up with a right fourth and fifth ray extensive amputation I believe by Dr. [name] for underlying osteomyelitis. Prior to the surgery he had arterial studies that were normal on the right with an ABI of 1.26 and triphasic waveforms and on the left noncompressible with triphasic waveforms. Unfortunately they did not do TBI's. In any case he apparently developed a nonhealing area on the right lateral foot. He has been applying Silvadene to this. He uses his own running shoe with a custom insert. He has an AFO on the left which is fixed in the other shoe. He is here for a second opinion of this wound. Unfortunately 2 days ago he also had a fall and he has a considerably long laceration across the mid tibia. The only Steri-Stripped this they did not go to the ER this was not sutured although it probably should have been Past medical history; type 2 diabetes with a history of PAD and peripheral neuropathy, prostate CA treated with radiation, cervical spondylosis, left foot ulcer, history of osteomyelitis coronary artery disease status post CABG in 2005 recent fistula creation in the left arm in preparation for  the dialysis, gout and a history of venous insufficiency ABIs were not done in this clinic as they were done in September 1.26 on the right and 2.18 on the left noncompressible. TBI's were not done but his waveforms were said to be triphasic bilaterally 05/13/2019; I reviewed the operative report from Dr. [name] on 12/14/2018. I think he went into the OR expecting to just have amputation of the fourth and fifth toes but after incision the margins were still completely infected and necrotic therefore the surgery was extended to encompass the metatarsals. Tissue was sent for culture this showed E. coli I am not really sure about the duration of his antibiotics whether he received this at dialysis or not. The patient thinks his wound has been present ever since the surgery. This is on the remanent of the metatarsals laterally. Last week hyper granulated this week really healthy looking with an epithelialized margin He also has a traumatic laceration which was Steri-Stripped by [place] last week. This has open areas inferiorly but I did not remove the Steri-Strips this week. I am going to give this another week and then remove them next week and deal with what is there. Finally we got a phone call from the transplant people at [facility]. They want to know if this is going to be healed whether he can be delayed for 6 months on the list. 2/12; patient has a small wound on the right lateral foot. This looks healthy. X-ray showed no acute bony abnormality no evidence of osteomyelitis. He is noted to have heavy vascular calcification however. He points out a new area on the tip of the left second toe with  loss of the nail in this area this is the area where he has a AFO brace Electronic Signature(s) Signed: 05/24/2019 6:14:31 PM By: Jon Hurst RN, BSN Signed: 05/24/2019 6:16:05 PM By: Linton Ham MD Previous Signature: 05/21/2019 5:45:04 PM Version By: Linton Ham MD Entered By: [name] on 05/24/2019  17:55:15 -------------------------------------------------------------------------------- Physician Orders Details Patient Name: Date of Service: [name], [name] 05/21/2019 3:00 PM Medical Record [id number] Patient Account Number: [id number] Date of Birth/Sex: Treating RN: 1950-12-29 (69 y.o. [name] Primary Care Provider: Fanny [name] Other Clinician: Referring Provider: Treating Provider/Extender:Demontrae Gilbert, [name], [name] in Treatment: 2 Verbal / Phone Orders: No Diagnosis Coding ICD-10 Coding Code Description E11.621 Type 2 diabetes mellitus with foot ulcer L97.511 Non-pressure chronic ulcer of other part of right foot limited to breakdown of skin E11.42 Type 2 diabetes mellitus with diabetic polyneuropathy S81.811D Laceration without foreign body, right lower leg, subsequent encounter Follow-up Appointments Return Appointment in 1 week. Dressing Change Frequency Change Dressing every other day. Wound Cleansing May shower with protection. Primary Wound Dressing Wound #5 Right,Lateral Foot Calcium Alginate with Silver Wound #7 Left Toe Second Calcium Alginate with Silver Edema Control Kerlix and Coban - Right Lower Extremity Off-Loading Open toe surgical shoe to: - felt in shoe. Other: - surgical shoe to right foot Electronic Signature(s) Signed: 05/21/2019 5:45:04 PM By: Linton Ham MD Signed: 05/24/2019 6:12:41 PM By: Kela Millin Entered By: Kela Millin on 05/21/2019 15:38:00 -------------------------------------------------------------------------------- Problem List Details Patient Name: Date of Service: Jon, [name] 05/21/2019 3:00 PM Medical Record [id number] Patient Account Number: [id number] Date of Birth/Sex: Treating RN: 16-Jan-1951 (69 y.o. [name] Primary Care Provider: Fanny [name] Other Clinician: Referring Provider: Treating Provider/Extender:[name], [name], [name] in Treatment: 2 Active Problems ICD-10 Evaluated Encounter Code Description Active Date Today Diagnosis E11.621 Type 2 diabetes mellitus with foot ulcer 05/04/2019 No Yes L97.511 Non-pressure chronic ulcer of other part of right foot 05/04/2019 No Yes limited to breakdown of skin E11.42 Type 2 diabetes mellitus with diabetic polyneuropathy 05/04/2019 No Yes S81.811D Laceration without foreign body, right lower leg, 05/04/2019 No Yes subsequent encounter L97.521 Non-pressure chronic ulcer of other part of left foot 05/21/2019 No Yes limited to breakdown of skin Inactive Problems Resolved Problems Electronic Signature(s) Signed: 05/21/2019 5:45:04 PM By: Linton Ham MD Entered By: Linton Ham on 05/21/2019 16:26:20 -------------------------------------------------------------------------------- Progress Note Details Patient Name: Date of Service: ASEEM, SESSUMS 05/21/2019 3:00 PM Medical Record [id number] Patient Account Number: [id number] Date of Birth/Sex: Treating RN: [birth year] (69 y.o. [name] Primary Care Provider: Fanny [name] Other Clinician: Referring Provider: Treating Provider/Extender:Zakaria Sedor, [name], [name] in Treatment: 2 Subjective History of Present Illness (HPI) ADMISSION 05/04/2019 This is a 69 year old patient who spent a prolonged period of time in this clinic in 2014-2015 cared for by Dr. Jerline Pain with apparently predominantly wounds on the left plantar foot. This apparently healed up. He is a type II diabetic with a history of peripheral neuropathy and PAD. In September 2020 he ended up with a right fourth and fifth ray extensive amputation I believe by Dr. [name] for underlying osteomyelitis. Prior to the surgery he had arterial studies that were normal on the right with an ABI of 1.26 and triphasic waveforms and on the left noncompressible with triphasic waveforms. Unfortunately they did not do TBI's. In any case  he apparently developed a nonhealing area on the right lateral foot. He has been applying Silvadene to this. He uses his own running  shoe with a custom insert. He has an AFO on the left which is fixed in the other shoe. He is here for a second opinion of this wound. Unfortunately 2 days ago he also had a fall and he has a considerably long laceration across the mid tibia. The only Steri-Stripped this they did not go to the ER this was not sutured although it probably should have been Past medical history; type 2 diabetes with a history of PAD and peripheral neuropathy, prostate CA treated with radiation, cervical spondylosis, left foot ulcer, history of osteomyelitis coronary artery disease status post CABG in 2005 recent fistula creation in the left arm in preparation for the dialysis, gout and a history of venous insufficiency ABIs were not done in this clinic as they were done in September 1.26 on the right and 2.18 on the left noncompressible. TBI's were not done but his waveforms were said to be triphasic bilaterally 05/13/2019; I reviewed the operative report from Dr. [name] on 12/14/2018. I think he went into the OR expecting to just have amputation of the fourth and fifth toes but after incision the margins were still completely infected and necrotic therefore the surgery was extended to encompass the metatarsals. Tissue was sent for culture this showed E. coli I am not really sure about the duration of his antibiotics whether he received this at dialysis or not. The patient thinks his wound has been present ever since the surgery. This is on the remanent of the metatarsals laterally. Last week hyper granulated this week really healthy looking with an epithelialized margin He also has a traumatic laceration which was Steri-Stripped by [place] last week. This has open areas inferiorly but I did not remove the Steri-Strips this week. I am going to give this another week and then remove them next week  and deal with what is there. Finally we got a phone call from the transplant people at [facility]. They want to know if this is going to be healed whether he can be delayed for 6 months on the list. 2/12; patient has a small wound on the right lateral foot. This looks healthy. X-ray showed no acute bony abnormality no evidence of osteomyelitis. He is noted to have heavy vascular calcification however. He points out a new area on the tip of the left second toe with loss of the nail in this area this is the area where he has a AFO brace Objective Constitutional Vitals Time Taken: 3:10 PM, Height: 71 in, Weight: 192 lbs, BMI: 26.8, Temperature: 98.5 F, Pulse: 87 bpm, Respiratory Rate: 16 breaths/min, Blood Pressure: 97/45 mmHg, Capillary Blood Glucose: 135 mg/dl. Integumentary (Hair, Skin) Wound #5 status is Open. Original cause of wound was Surgical Injury. The wound is located on the Right,Lateral Foot. The wound measures 0.6cm length x 0.3cm width x 0.1cm depth; 0.141cm^2 area and 0.014cm^3 volume. There is Fat Layer (Subcutaneous Tissue) Exposed exposed. There is no tunneling or undermining noted. There is a small amount of serosanguineous drainage noted. The wound margin is flat and intact. There is large (67-100%) red, pink granulation within the wound bed. There is no necrotic tissue within the wound bed. Wound #6 status is Healed - Epithelialized. Original cause of wound was Trauma. The wound is located on the Right,Anterior Lower Leg. The wound measures 0cm length x 0cm width x 0cm depth; 0cm^2 area and 0cm^3 volume. There is Fat Layer (Subcutaneous Tissue) Exposed exposed. There is a small amount of serosanguineous drainage noted. The wound margin  is flat and intact. There is large (67-100%) red granulation within the wound bed. There is no necrotic tissue within the wound bed. Wound #7 status is Open. Original cause of wound was Gradually Appeared. The wound is located on the Left  Toe Second. The wound measures 0.3cm length x 0.4cm width x 0.1cm depth; 0.094cm^2 area and 0.009cm^3 volume. There is no tunneling or undermining noted. There is a medium amount of serosanguineous drainage noted. The wound margin is distinct with the outline attached to the wound base. There is large (67-100%) red, pink granulation within the wound bed. There is a small (1-33%) amount of necrotic tissue within the wound bed including Adherent Slough. Assessment Active Problems ICD-10 Type 2 diabetes mellitus with foot ulcer Non-pressure chronic ulcer of other part of right foot limited to breakdown of skin Type 2 diabetes mellitus with diabetic polyneuropathy Laceration without foreign body, right lower leg, subsequent encounter Non-pressure chronic ulcer of other part of left foot limited to breakdown of skin Plan Follow-up Appointments: Return Appointment in 1 week. Dressing Change Frequency: Change Dressing every other day. Wound Cleansing: May shower with protection. Primary Wound Dressing: Wound #5 Right,Lateral Foot: Calcium Alginate with Silver Wound #7 Left Toe Second: Calcium Alginate with Silver Edema Control: Kerlix and Coban - Right Lower Extremity Off-Loading: Open toe surgical shoe to: - felt in shoe. Other: - surgical shoe to right foot 1. Right lateral foot I am still going to apply silver alginate to this area 2. kerlix and Coban. We have him in a healing sandal 3. I am going to apply silver alginate to the toe itself I think this is related to pressure on the tip of the shoe with the AFO on. We will keep an eye on that. He has severe mycotic toenails this will probably need podiatry going forward 4. His x-ray was negative for osteomyelitis the wound appears to be improving. 5. I do not think any further imaging studies are necessary as long as the wounds close over. I wonder if he is going to need a trip to look at this foot wear Electronic  Signature(s) Signed: 05/24/2019 6:14:31 PM By: Jon Hurst RN, BSN Signed: 05/24/2019 6:16:05 PM By: Linton Ham MD Previous Signature: 05/21/2019 5:45:04 PM Version By: Linton Ham MD Entered By: [name] on 05/24/2019 17:55:27 -------------------------------------------------------------------------------- [place] Details Patient Name: Date of Service: SIMEON, VERA 05/21/2019 Medical Record [id number] Patient Account Number: [id number] Date of Birth/Sex: Treating RN: [birth year] (69 y.o. [name] Primary Care Provider: Fanny [name] Other Clinician: Referring Provider: Treating Provider/Extender:Manar Smalling, [name], [name] in Treatment: 2 Diagnosis Coding ICD-10 Codes Code Description E11.621 Type 2 diabetes mellitus with foot ulcer L97.511 Non-pressure chronic ulcer of other part of right foot limited to breakdown of skin E11.42 Type 2 diabetes mellitus with diabetic polyneuropathy S81.811D Laceration without foreign body, right lower leg, subsequent encounter Facility Procedures CPT4 Code: [id number] Description: [place] VISIT-LEV 3 EST PT Modifier: Quantity: 1 Physician Procedures Electronic Signature(s) Signed: 05/21/2019 5:45:04 PM By: Linton Ham MD Entered By: Linton Ham on 05/21/2019 03:49:17

## 2019-05-28 ENCOUNTER — Other Ambulatory Visit: Payer: Self-pay

## 2019-05-28 ENCOUNTER — Encounter (HOSPITAL_BASED_OUTPATIENT_CLINIC_OR_DEPARTMENT_OTHER): Payer: Medicare Other | Admitting: Internal Medicine

## 2019-05-28 DIAGNOSIS — E11621 Type 2 diabetes mellitus with foot ulcer: Secondary | ICD-10-CM | POA: Diagnosis not present

## 2019-05-31 NOTE — Progress Notes (Signed)
[name], [name] ([id number]) Visit Report for 05/28/2019 HPI Details Patient Name: Date of Service: [name], [name] 05/28/2019 3:45 PM Medical Record [id number] Patient Account Number: [id number] Date of Birth/Sex: Treating RN: 11/18/1950 (69 y.o. [name] Primary Care Provider: Fanny [name] Other Clinician: Referring Provider: Treating Provider/Extender:[name], [name], [name] in Treatment: 3 History of Present Illness HPI Description: ADMISSION 05/04/2019 This is a 69 year old patient who spent a prolonged period of time in this clinic in 2014-2015 cared for by [name] with apparently predominantly wounds on the left plantar foot. This apparently healed up. He is a type II diabetic with a history of peripheral neuropathy and PAD. In September 2020 he ended up with a right fourth and fifth ray extensive amputation I believe by [name] for underlying osteomyelitis. Prior to the surgery he had arterial studies that were normal on the right with an ABI of 1.26 and triphasic waveforms and on the left noncompressible with triphasic waveforms. Unfortunately they did not do TBI's. In any case he apparently developed a nonhealing area on the right lateral foot. He has been applying Silvadene to this. He uses his own running shoe with a custom insert. He has an AFO on the left which is fixed in the other shoe. He is here for a second opinion of this wound. Unfortunately 2 days ago he also had a fall and he has a considerably long laceration across the mid tibia. The only Steri-Stripped this they did not go to the ER this was not sutured although it probably should have been Past medical history; type 2 diabetes with a history of PAD and peripheral neuropathy, prostate CA treated with radiation, cervical spondylosis, left foot ulcer, history of osteomyelitis coronary artery disease status post CABG in 2005 recent fistula creation in the left arm in preparation for  the dialysis, gout and a history of venous insufficiency ABIs were not done in this clinic as they were done in September 1.26 on the right and 2.18 on the left noncompressible. TBI's were not done but his waveforms were said to be triphasic bilaterally 05/13/2019; I reviewed the operative report from [name] on 12/14/2018. I think he went into the OR expecting to just have amputation of the fourth and fifth toes but after incision the margins were still completely infected and necrotic therefore the surgery was extended to encompass the metatarsals. Tissue was sent for culture this showed E. coli I am not really sure about the duration of his antibiotics whether he received this at dialysis or not. The patient thinks his wound has been present ever since the surgery. This is on the remanent of the metatarsals laterally. Last week hyper granulated this week really healthy looking with an epithelialized margin He also has a traumatic laceration which was Steri-Stripped by [place] last week. This has open areas inferiorly but I did not remove the Steri-Strips this week. I am going to give this another week and then remove them next week and deal with what is there. Finally we got a phone call from the transplant people at [name]. They want to know if this is going to be healed whether he can be delayed for 6 months on the list. 2/12; patient has a small wound on the right lateral foot. This looks healthy. X-ray showed no acute bony abnormality no evidence of osteomyelitis. He is noted to have heavy vascular calcification however. He points out a new area on the tip of the left second toe with  loss of the nail in this area this is the area where he has a AFO brace 2/19; both the patient's wound area on the right lateral foot and the left second toe are healed. With regards to the right lateral foot I think this is a pressure/friction area related to how he walks with his right foot in an AFO. I think the  crucial thing here is pressure relief. The patient is going to go see people at [employer] and I support this He is anxious to be back on the transplant list at [name] for kidney transplant or at least not to be removed from it. We will try to communicate with the transplant team at [facility] Signed: 05/28/2019 6:29:59 PM By: Jon Ham MD Entered By: [name] on 05/28/2019 18:19:46 -------------------------------------------------------------------------------- Physical Exam Details Patient Name: Date of Service: [name], [name] 05/28/2019 3:45 PM Medical Record [id number] Patient Account Number: [id number] Date of Birth/Sex: Treating RN: May 20, 1950 (69 y.o. [name] Primary Care Provider: Fanny [name] Other Clinician: Referring Provider: Treating Provider/Extender:Dann Ventress, [name], [name] in Treatment: 3 Constitutional Sitting or standing Blood Pressure is within target range for patient.. Pulse regular and within target range for patient.Marland Kitchen Respirations regular, non-labored and within target range.. Temperature is normal and within the target range for the patient.Marland Kitchen Appears in no distress. Notes Wound exam; the area in question is on the lateral remanent of his right foot. This is a protruding area of bone which I think is the remnant of the fifth metatarsal. He is developing thick callus in this area which is probably constant friction and pressure perhaps over time he would develop a false bursa. He says it occasionally hurts and I think that would be a good explanation. The area on the left second toe nailbed is closed Electronic Signature(s) Signed: 05/28/2019 6:29:59 PM By: Jon Ham MD Entered By: [name] on 05/28/2019 18:20:47 -------------------------------------------------------------------------------- Physician Orders Details Patient Name: Date of Service: COLBI, STAUBS 05/28/2019 3:45  PM Medical Record [id number] Patient Account Number: [id number] Date of Birth/Sex: Treating RN: 1951/02/02 (69 y.o. [name] Primary Care Provider: Fanny [name] Other Clinician: Referring Provider: Treating Provider/Extender:Tameah Mihalko, [name], [name] in Treatment: 3 Verbal / Phone Orders: No Diagnosis Coding ICD-10 Coding Code Description E11.621 Type 2 diabetes mellitus with foot ulcer L97.511 Non-pressure chronic ulcer of other part of right foot limited to breakdown of skin E11.42 Type 2 diabetes mellitus with diabetic polyneuropathy S81.811D Laceration without foreign body, right lower leg, subsequent encounter L97.521 Non-pressure chronic ulcer of other part of left foot limited to breakdown of skin Discharge From [facility] Discharge from [facility] - call if wound re-opens Notes cushion right lateral foot Electronic Signature(s) Signed: 05/28/2019 6:29:59 PM By: Jon Ham MD Signed: 05/31/2019 1:40:10 PM By: Kela Millin Entered By: Kela Millin on 05/28/2019 17:48:22 -------------------------------------------------------------------------------- Problem List Details Patient Name: Date of Service: [name], [name] 05/28/2019 3:45 PM Medical Record [id number] Patient Account Number: [id number] Date of Birth/Sex: Treating RN: 1950/10/26 (69 y.o. [name] Primary Care Provider: Fanny [name] Other Clinician: Referring Provider: Treating Provider/Extender:Mohd Clemons, [name], [name] in Treatment: 3 Active Problems ICD-10 Evaluated Encounter Code Description Active Date Today Diagnosis E11.621 Type 2 diabetes mellitus with foot ulcer 05/04/2019 No Yes L97.511 Non-pressure chronic ulcer of other part of right foot 05/04/2019 No Yes limited to breakdown of skin E11.42 Type 2 diabetes mellitus with diabetic polyneuropathy 05/04/2019 No Yes S81.811D Laceration without foreign body,  right lower leg, 05/04/2019 No Yes subsequent encounter L97.521 Non-pressure chronic ulcer of other part of left foot 05/21/2019 No Yes limited to breakdown of skin Inactive Problems Resolved Problems Electronic Signature(s) Signed: 05/28/2019 6:29:59 PM By: Jon Ham MD Entered By: [name] on 05/28/2019 18:18:21 -------------------------------------------------------------------------------- Progress Note Details Patient Name: Date of Service: [name], [name] 05/28/2019 3:45 PM Medical Record [id number] Patient Account Number: [id number] Date of Birth/Sex: Treating RN: 12/01/1950 (69 y.o. [name] Primary Care Provider: Fanny [name] Other Clinician: Referring Provider: Treating Provider/Extender:Gwendalyn Mcgonagle, [name], [name] in Treatment: 3 Subjective History of Present Illness (HPI) ADMISSION 05/04/2019 This is a 69 year old patient who spent a prolonged period of time in this clinic in 2014-2015 cared for by [name] with apparently predominantly wounds on the left plantar foot. This apparently healed up. He is a type II diabetic with a history of peripheral neuropathy and PAD. In September 2020 he ended up with a right fourth and fifth ray extensive amputation I believe by [name] for underlying osteomyelitis. Prior to the surgery he had arterial studies that were normal on the right with an ABI of 1.26 and triphasic waveforms and on the left noncompressible with triphasic waveforms. Unfortunately they did not do TBI's. In any case he apparently developed a nonhealing area on the right lateral foot. He has been applying Silvadene to this. He uses his own running shoe with a custom insert. He has an AFO on the left which is fixed in the other shoe. He is here for a second opinion of this wound. Unfortunately 2 days ago he also had a fall and he has a considerably long laceration across the mid tibia. The only Steri-Stripped this they did  not go to the ER this was not sutured although it probably should have been Past medical history; type 2 diabetes with a history of PAD and peripheral neuropathy, prostate CA treated with radiation, cervical spondylosis, left foot ulcer, history of osteomyelitis coronary artery disease status post CABG in 2005 recent fistula creation in the left arm in preparation for the dialysis, gout and a history of venous insufficiency ABIs were not done in this clinic as they were done in September 1.26 on the right and 2.18 on the left noncompressible. TBI's were not done but his waveforms were said to be triphasic bilaterally 05/13/2019; I reviewed the operative report from [name] on 12/14/2018. I think he went into the OR expecting to just have amputation of the fourth and fifth toes but after incision the margins were still completely infected and necrotic therefore the surgery was extended to encompass the metatarsals. Tissue was sent for culture this showed E. coli I am not really sure about the duration of his antibiotics whether he received this at dialysis or not. The patient thinks his wound has been present ever since the surgery. This is on the remanent of the metatarsals laterally. Last week hyper granulated this week really healthy looking with an epithelialized margin He also has a traumatic laceration which was Steri-Stripped by [place] last week. This has open areas inferiorly but I did not remove the Steri-Strips this week. I am going to give this another week and then remove them next week and deal with what is there. Finally we got a phone call from the transplant people at [name]. They want to know if this is going to be healed whether he can be delayed for 6 months on the list. 2/12; patient has a small wound  on the right lateral foot. This looks healthy. X-ray showed no acute bony abnormality no evidence of osteomyelitis. He is noted to have heavy vascular calcification however. He points  out a new area on the tip of the left second toe with loss of the nail in this area this is the area where he has a AFO brace 2/19; both the patient's wound area on the right lateral foot and the left second toe are healed. With regards to the right lateral foot I think this is a pressure/friction area related to how he walks with his right foot in an AFO. I think the crucial thing here is pressure relief. The patient is going to go see people at [employer] and I support this He is anxious to be back on the transplant list at [name] for kidney transplant or at least not to be removed from it. We will try to communicate with the transplant team at [name] Objective Constitutional Sitting or standing Blood Pressure is within target range for patient.. Pulse regular and within target range for patient.Marland Kitchen Respirations regular, non-labored and within target range.. Temperature is normal and within the target range for the patient.Marland Kitchen Appears in no distress. Vitals Time Taken: 4:41 PM, Height: 71 in, Source: Stated, Weight: 192 lbs, Source: Stated, BMI: 26.8, Temperature: 98.4 F, Pulse: 76 bpm, Respiratory Rate: 18 breaths/min, Blood Pressure: 126/59 mmHg, Capillary Blood Glucose: 175 mg/dl. General Notes: glucose per pt report today General Notes: Wound exam; the area in question is on the lateral remanent of his right foot. This is a protruding area of bone which I think is the remnant of the fifth metatarsal. He is developing thick callus in this area which is probably constant friction and pressure perhaps over time he would develop a false bursa. He says it occasionally hurts and I think that would be a good explanation. ooThe area on the left second toe nailbed is closed Integumentary (Hair, Skin) Wound #5 status is Healed - Epithelialized. Original cause of wound was Surgical Injury. The wound is located on the Right,Lateral Foot. The wound measures 0cm length x 0cm width x 0cm depth; 0cm^2  area and 0cm^3 volume. There is no tunneling or undermining noted. There is a none present amount of drainage noted. The wound margin is flat and intact. There is no granulation within the wound bed. There is no necrotic tissue within the wound bed. Wound #7 status is Healed - Epithelialized. Original cause of wound was Gradually Appeared. The wound is located on the Left Toe Second. The wound measures 0cm length x 0cm width x 0cm depth; 0cm^2 area and 0cm^3 volume. There is no tunneling or undermining noted. There is a none present amount of drainage noted. The wound margin is distinct with the outline attached to the wound base. There is no granulation within the wound bed. There is a small (1-33%) amount of necrotic tissue within the wound bed including Adherent Slough. Assessment Active Problems ICD-10 Type 2 diabetes mellitus with foot ulcer Non-pressure chronic ulcer of other part of right foot limited to breakdown of skin Type 2 diabetes mellitus with diabetic polyneuropathy Laceration without foreign body, right lower leg, subsequent encounter Non-pressure chronic ulcer of other part of left foot limited to breakdown of skin Plan Discharge From [name]: Discharge from [facility] - call if wound re-opens General Notes: cushion right lateral foot 1. I think this patient really is just on the issue of offloading this area on his right foot. This  is not easy to do when your left foot is fixed in an AFO brace I agree with the thought of going to see the people at [facility]. 2. I have asked him to lubricate this area religiously to soften down the developing callus. If he cannot relieve the pressure in this area I think this will be a recurrent issue Electronic Signature(s) Signed: 05/28/2019 6:29:59 PM By: Jon Ham MD Entered By: [name] on 05/28/2019 18:22:09 -------------------------------------------------------------------------------- SuperBill  Details Patient Name: Date of Service: [name], [name] 05/28/2019 Medical Record [id number] Patient Account Number: [id number] Date of Birth/Sex: Treating RN: 1950-11-14 (69 y.o. [name] Primary Care Provider: Fanny [name] Other Clinician: Referring Provider: Treating Provider/Extender:Welton Bord, [name], [name] in Treatment: 3 Diagnosis Coding ICD-10 Codes Code Description E11.621 Type 2 diabetes mellitus with foot ulcer L97.511 Non-pressure chronic ulcer of other part of right foot limited to breakdown of skin E11.42 Type 2 diabetes mellitus with diabetic polyneuropathy S81.811D Laceration without foreign body, right lower leg, subsequent encounter L97.521 Non-pressure chronic ulcer of other part of left foot limited to breakdown of skin Facility Procedures CPT4 Code: [id number] Description: 99213 - WOUND CARE VISIT-LEV 3 EST PT Modifier: Quantity: 1 Physician Procedures CPT4 Code Description: [id number] - WC PHYS LEVEL 2 - EST PT ICD-10 Diagnosis Description L97.511 Non-pressure chronic ulcer of other part of right foot limi E11.621 Type 2 diabetes mellitus with foot ulcer Modifier: ted to breakdo Quantity: 1 wn of skin Electronic Signature(s) Signed: 05/28/2019 6:29:59 PM By: Jon Ham MD Entered By: [name] on 05/28/2019 18:22:25

## 2019-05-31 NOTE — Progress Notes (Addendum)
SUMMIT, ARROYAVE ([id number]) Visit Report for 05/28/2019 Arrival Information Details Patient Name: Date of Service: CARTEL, MAUSS 05/28/2019 3:45 PM Medical Record [id number] Patient Account Number: [id number] Date of Birth/Sex: Treating RN: 1951-02-21 (69 y.o. Ernestene Mention Primary Care Braedyn Kauk: Fanny Bien Other Clinician: Referring Kaian Fahs: Treating Kent Braunschweig/Extender:Robson, Gennette Pac, Rojelio Brenner in Treatment: 3 Visit Information History Since Last Visit Added or deleted any medications: No Patient Arrived: Kasandra Knudsen Any new allergies or adverse reactions: No Arrival Time: 16:40 Had a fall or experienced change in No Accompanied By: spouse activities of daily living that may affect Transfer Assistance: None risk of falls: Patient Identification Verified: Yes Signs or symptoms of abuse/neglect since last No Secondary Verification Process Completed: Yes visito Patient Requires Transmission-Based No Hospitalized since last visit: No Precautions: Implantable device outside of the clinic excluding No Patient Has Alerts: Yes cellular tissue based products placed in the center Patient Alerts: R ABI = since last visit: 1.26 Has Dressing in Place as Prescribed: Yes L ABI = Has Compression in Place as Prescribed: Yes 2.18 Pain Present Now: Yes Electronic Signature(s) Signed: 05/28/2019 6:14:01 PM By: Baruch Gouty RN, BSN Entered By: Baruch Gouty on 05/28/2019 16:40:58 -------------------------------------------------------------------------------- Clinic Level of Care Assessment Details Patient Name: Date of Service: VASHAWN, EKSTEIN 05/28/2019 3:45 PM Medical Record [id number] Patient Account Number: [id number] Date of Birth/Sex: Treating RN: [birth year] (69 y.o. [name] Primary Care Jawon Dipiero: Fanny Bien Other Clinician: Referring Lennart Gladish: Treating Malachi Kinzler/Extender:Robson, Gennette Pac, Rojelio Brenner in Treatment:  3 Clinic Level of Care Assessment Items TOOL 4 Quantity Score X - Use when only an EandM is performed on FOLLOW-UP visit 1 0 ASSESSMENTS - Nursing Assessment / Reassessment X - Reassessment of Co-morbidities (includes updates in patient status) 1 10 X - Reassessment of Adherence to Treatment Plan 1 5 ASSESSMENTS - Wound and Skin Assessment / Reassessment X - Simple Wound Assessment / Reassessment - one wound 1 5 []  - Complex Wound Assessment / Reassessment - multiple wounds 0 []  - Dermatologic / Skin Assessment (not related to wound area) 0 ASSESSMENTS - Focused Assessment X - Circumferential Edema Measurements - multi extremities 1 5 []  - Nutritional Assessment / Counseling / Intervention 0 []  - Lower Extremity Assessment (monofilament, tuning fork, pulses) 0 []  - Peripheral Arterial Disease Assessment (using hand held doppler) 0 ASSESSMENTS - Ostomy and/or Continence Assessment and Care []  - Incontinence Assessment and Management 0 []  - Ostomy Care Assessment and Management (repouching, etc.) 0 PROCESS - Coordination of Care X - Simple Patient / Family Education for ongoing care 1 15 []  - Complex (extensive) Patient / Family Education for ongoing care 0 X - Staff obtains Programmer, systems, Records, Test Results / Process Orders 1 10 []  - Staff telephones HHA, Nursing Homes / Clarify orders / etc 0 []  - Routine Transfer to another Facility (non-emergent condition) 0 []  - Routine Hospital Admission (non-emergent condition) 0 []  - New Admissions / Biomedical engineer / Ordering NPWT, Apligraf, etc. 0 []  - Emergency Hospital Admission (emergent condition) 0 X - Simple Discharge Coordination 1 10 []  - Complex (extensive) Discharge Coordination 0 PROCESS - Special Needs []  - Pediatric / Minor Patient Management 0 []  - Isolation Patient Management 0 []  - Hearing / Language / Visual special needs 0 []  - Assessment of Community assistance (transportation, D/C planning, etc.) 0 []  - Additional  assistance / Altered mentation 0 []  - Support Surface(s) Assessment (bed, cushion, seat, etc.) 0 INTERVENTIONS - Wound Cleansing / Measurement X - Simple Wound  Cleansing - one wound 1 5 []  - Complex Wound Cleansing - multiple wounds 0 X - Wound Imaging (photographs - any number of wounds) 1 5 []  - Wound Tracing (instead of photographs) 0 X - Simple Wound Measurement - one wound 1 5 []  - Complex Wound Measurement - multiple wounds 0 INTERVENTIONS - Wound Dressings []  - Small Wound Dressing one or multiple wounds 0 []  - Medium Wound Dressing one or multiple wounds 0 []  - Large Wound Dressing one or multiple wounds 0 []  - Application of Medications - topical 0 []  - Application of Medications - injection 0 INTERVENTIONS - Miscellaneous []  - External ear exam 0 []  - Specimen Collection (cultures, biopsies, blood, body fluids, etc.) 0 []  - Specimen(s) / Culture(s) sent or taken to Lab for analysis 0 []  - Patient Transfer (multiple staff / Civil Service fast streamer / Similar devices) 0 []  - Simple Staple / Suture removal (25 or less) 0 []  - Complex Staple / Suture removal (26 or more) 0 []  - Hypo / Hyperglycemic Management (close monitor of Blood Glucose) 0 []  - Ankle / Brachial Index (ABI) - do not check if billed separately 0 X - Vital Signs 1 5 Has the patient been seen at the hospital within the last three years: Yes Total Score: 80 Level Of Care: New/Established - Level 3 Electronic Signature(s) Signed: 05/31/2019 1:40:10 PM By: Kela Millin Entered By: Kela Millin on 05/28/2019 17:42:52 -------------------------------------------------------------------------------- Lower Extremity Assessment Details Patient Name: Date of Service: ZYIRE, EIDSON 05/28/2019 3:45 PM Medical Record [id number] Patient Account Number: [id number] Date of Birth/Sex: Treating RN: 05-27-1950 (69 y.o. Ernestene Mention Primary Care Delle Andrzejewski: Fanny Bien Other Clinician: Referring Undra Harriman: Treating  Fawaz Borquez/Extender:Robson, Gennette Pac, Rojelio Brenner in Treatment: 3 Edema Assessment Assessed: [Left: No] [Right: No] Edema: [Left: No] [Right: No] Calf Left: Right: Point of Measurement: cm From Medial Instep 30 cm 33 cm Ankle Left: Right: Point of Measurement: cm From Medial Instep 20 cm 19 cm Vascular Assessment Pulses: Dorsalis Pedis Palpable: [Left:Yes] [Right:Yes] Electronic Signature(s) Signed: 05/28/2019 6:14:01 PM By: Baruch Gouty RN, BSN Entered By: Baruch Gouty on 05/28/2019 16:46:14 -------------------------------------------------------------------------------- Multi Wound Chart Details Patient Name: Date of Service: JLEN, WINTLE 05/28/2019 3:45 PM Medical Record [id number] Patient Account Number: [id number] Date of Birth/Sex: Treating RN: 10-24-1950 (69 y.o. [name] Primary Care Dailan Pfalzgraf: Fanny Bien Other Clinician: Referring Jabir Dahlem: Treating Morad Tal/Extender:Robson, Gennette Pac, Rojelio Brenner in Treatment: 3 Vital Signs Height(in): 71 Capillary Blood 175 Glucose(mg/dl): Weight(lbs): 192 Pulse(bpm): 26 Body Mass Index(BMI): 27 Blood Pressure(mmHg): 126/59 Temperature(F): 98.4 Respiratory 18 Rate(breaths/min): Photos: [5:No Photos] [7:No Photos] [N/A:N/A] Wound Location: [5:Right Foot - Lateral] [7:Left Toe Second] [N/A:N/A] Wounding Event: [5:Surgical Injury] [7:Gradually Appeared] [N/A:N/A] Primary Etiology: [5:Diabetic Wound/Ulcer of the Diabetic Wound/Ulcer of the N/A Lower Extremity] [7:Lower Extremity] Secondary Etiology: [5:Open Surgical Wound] [7:N/A] [N/A:N/A] Comorbid History: [5:Coronary Artery Disease, Coronary Artery Disease, N/A Hypertension, Type II Diabetes, End Stage Renal Diabetes, End Stage Renal Disease, Gout, Osteomyelitis, Neuropathy, Osteomyelitis, Neuropathy, Received Radiation,  Anorexia/bulimia, Confinement Anxiety] [7:Hypertension, Type II Disease, Gout, Received Radiation,  Anorexia/bulimia, Confinement Anxiety] Date Acquired: [5:05/02/2019] [7:05/13/2019] [N/A:N/A] Weeks of Treatment: [5:3] [7:1] [N/A:N/A] Wound Status: [5:Healed - Epithelialized] [7:Healed - Epithelialized] [N/A:N/A] Measurements L x W x D 0x0x0 [7:0x0x0] [N/A:N/A] (cm) Area (cm) : [5:0] [7:0] [N/A:N/A] Volume (cm) : [5:0] [7:0] [N/A:N/A] % Reduction in Area: [5:100.00%] [7:100.00%] [N/A:N/A] % Reduction in Volume: 100.00% [7:100.00%] [N/A:N/A] Classification: [5:Grade 2] [7:Grade 1] [N/A:N/A] Exudate Amount: [5:None Present] [7:None Present] [N/A:N/A] Wound  Margin: [5:Flat and Intact] [7:Distinct, outline attached N/A] Granulation Amount: [5:None Present (0%)] [7:None Present (0%)] [N/A:N/A] Necrotic Amount: [5:None Present (0%)] [7:Small (1-33%)] [N/A:N/A] Exposed Structures: [5:Fascia: No Fat Layer (Subcutaneous Fat Layer (Subcutaneous Tissue) Exposed: No Tendon: No Muscle: No Joint: No Bone: No Large (67-100%)] [7:Fascia: No Tissue) Exposed: No Tendon: No Muscle: No Joint: No Bone: No Large (67-100%)] [N/A:N/A N/A] Treatment Notes Electronic Signature(s) Signed: 05/28/2019 6:29:59 PM By: Linton Ham MD Signed: 05/31/2019 1:40:10 PM By: Kela Millin Entered By: Linton Ham on 05/28/2019 18:18:30 -------------------------------------------------------------------------------- Multi-Disciplinary Care Plan Details Patient Name: Date of Service: ISLEY, ZINNI 05/28/2019 3:45 PM Medical Record [id number] Patient Account Number: [id number] Date of Birth/Sex: Treating RN: 01-08-1951 (69 y.o. [name] Primary Care Walburga Hudman: Fanny Bien Other Clinician: Referring Dvontae Ruan: Treating Kymorah Korf/Extender:Robson, Gennette Pac, Rojelio Brenner in Treatment: 3 Active Inactive Electronic Signature(s) Signed: 05/31/2019 1:40:10 PM By: Kela Millin Entered By: Kela Millin on 05/28/2019  17:47:35 -------------------------------------------------------------------------------- Pain Assessment Details Patient Name: Date of Service: CLAYTEN, ALLCOCK 05/28/2019 3:45 PM Medical Record [id number] Patient Account Number: [id number] Date of Birth/Sex: Treating RN: 16-Nov-1950 (69 y.o. Ernestene Mention Primary Care Mckinzi Eriksen: Fanny Bien Other Clinician: Referring Kaysa Roulhac: Treating Cyani Kallstrom/Extender:Robson, Gennette Pac, Rojelio Brenner in Treatment: 3 Active Problems Location of Pain Severity and Description of Pain Patient Has Paino Yes Site Locations Pain Location: Generalized Pain With Dressing Change: No Duration of the Pain. Constant / Intermittento Intermittent Rate the pain. Current Pain Level: 5 Least Pain Level: 0 Character of Pain Describe the Pain: Aching, Burning Pain Management and Medication Current Pain Management: Medication: Yes Is the Current Pain Management Adequate: Adequate How does your wound impact your activities of daily livingo Sleep: Yes Bathing: No Appetite: No Relationship With Others: No Bladder Continence: No Emotions: Yes Bowel Continence: No Work: No Toileting: No Drive: No Dressing: No Hobbies: No Notes c/o right heel burning Electronic Signature(s) Signed: 05/28/2019 6:14:01 PM By: Baruch Gouty RN, BSN Entered By: Baruch Gouty on 05/28/2019 16:44:13 -------------------------------------------------------------------------------- Patient/Caregiver Education Details Patient Name: Date of Service: Francis Dowse 2/19/2021andnbsp3:45 PM Medical Record [id number] Patient Account Number: [id number] Date of Birth/Gender: Treating RN: Jan 08, 1951 (69 y.o. [name] Primary Care Physician: Fanny Bien Other Clinician: Referring Physician: Treating Physician/Extender:Robson, Gennette Pac, Rojelio Brenner in Treatment: 3 Education Assessment Education Provided To: Patient Education  Topics Provided Wound/Skin Impairment: Methods: Explain/Verbal Responses: State content correctly Electronic Signature(s) Signed: 05/31/2019 1:40:10 PM By: Kela Millin Entered By: Kela Millin on 05/28/2019 17:37:39 -------------------------------------------------------------------------------- Wound Assessment Details Patient Name: Date of Service: SURESH, AUDI 05/28/2019 3:45 PM Medical Record [id number] Patient Account Number: [id number] Date of Birth/Sex: Treating RN: [birth year] (69 y.o. [name] Primary Care Talley Kreiser: Fanny Bien Other Clinician: Referring Jeremiah Curci: Treating Kamariya Blevens/Extender:Robson, Gennette Pac, Rojelio Brenner in Treatment: 3 Wound Status Wound Number: 5 Primary Diabetic Wound/Ulcer of the Lower Extremity Etiology: Wound Location: Right Foot - Lateral Secondary Open Surgical Wound Wounding Event: Surgical Injury Etiology: Date Acquired: 05/02/2019 Wound Healed - Epithelialized Weeks Of Treatment: 3 Weeks Of Treatment: 3 Status: Clustered Wound: No Comorbid Coronary Artery Disease, Hypertension, Type History: II Diabetes, End Stage Renal Disease, Gout, Osteomyelitis, Neuropathy, Received Radiation, Anorexia/bulimia, Confinement Anxiety Photos Wound Measurements Length: (cm) 0 % Reductio Width: (cm) 0 % Reductio Depth: (cm) 0 Epithelial Area: (cm) 0 Tunneling Volume: (cm) 0 Undermini Wound Description Classification: Grade 2 Wound Margin: Flat and Intact Exudate Amount: None Present Wound Bed Granulation Amount: None Present (0%) Necrotic Amount: None Present (0%) Foul  Odor After Cleansing: No Slough/Fibrino No Exposed Structure Fascia Exposed: No Fat Layer (Subcutaneous Tissue) Exposed: No Tendon Exposed: No Muscle Exposed: No Joint Exposed: No Bone Exposed: No n in Area: 100% n in Volume: 100% ization: Large (67-100%) : No ng: No Electronic Signature(s) Signed: 05/31/2019 4:33:06 PM By:  Mikeal Hawthorne EMT/HBOT Signed: 05/31/2019 5:29:42 PM By: Kela Millin Previous Signature: 05/31/2019 1:40:10 PM Version By: Kela Millin Entered By: Mikeal Hawthorne on 05/31/2019 13:45:03 -------------------------------------------------------------------------------- Wound Assessment Details Patient Name: Date of Service: [place], RUMPLE 05/28/2019 3:45 PM Medical Record [id number] Patient Account Number: [id number] Date of Birth/Sex: Treating RN: [birth year] (69 y.o. [name] Primary Care Corliss Coggeshall: Fanny Bien Other Clinician: Referring Clifton Safley: Treating Endiya Klahr/Extender:Robson, Gennette Pac, Rojelio Brenner in Treatment: 3 Wound Status Wound Number: 7 Primary Diabetic Wound/Ulcer of the Lower Extremity Etiology: Wound Location: Left Toe Second Wound Healed - Epithelialized Wounding Event: Gradually Appeared Status: Date Acquired: 05/13/2019 Comorbid Coronary Artery Disease, Hypertension, Type II Weeks Of Treatment: 1 History: Diabetes, End Stage Renal Disease, Gout, Clustered Wound: No Osteomyelitis, Neuropathy, Received Radiation, Anorexia/bulimia, Confinement Anxiety Photos Wound Measurements Length: (cm) 0 % Reducti Width: (cm) 0 % Reducti Depth: (cm) 0 Epithelia Area: (cm) 0 Tunnelin Volume: (cm) 0 Undermin Wound Description Classification: Grade 1 Wound Margin: Distinct, outline attached Exudate Amount: None Present Wound Bed Granulation Amount: None Present (0%) Necrotic Amount: Small (1-33%) Necrotic Quality: Adherent Slough Foul Odor After Cleansing: No Slough/Fibrino No Exposed Structure Fascia Exposed: No Fat Layer (Subcutaneous Tissue) Exposed: No Tendon Exposed: No Muscle Exposed: No Joint Exposed: No Bone Exposed: No on in Area: 100% on in Volume: 100% lization: Large (67-100%) g: No ing: No Electronic Signature(s) Signed: 05/31/2019 4:33:06 PM By: Mikeal Hawthorne EMT/HBOT Signed: 05/31/2019 5:29:42 PM By:  Kela Millin Previous Signature: 05/31/2019 1:40:10 PM Version By: Kela Millin Entered By: Mikeal Hawthorne on 05/31/2019 13:44:38 -------------------------------------------------------------------------------- Vitals Details Patient Name: Date of Service: SIMRAN, MANNIS 05/28/2019 3:45 PM Medical Record [id number] Patient Account Number: [id number] Date of Birth/Sex: Treating RN: Feb 27, 1951 (69 y.o. Ernestene Mention Primary Care Jerick Khachatryan: Fanny Bien Other Clinician: Referring Lura Falor: Treating Adalynd Donahoe/Extender:Robson, Gennette Pac, Rojelio Brenner in Treatment: 3 Vital Signs Time Taken: 16:41 Temperature (F): 98.4 Height (in): 71 Pulse (bpm): 76 Source: Stated Respiratory Rate (breaths/min): 18 Weight (lbs): 192 Blood Pressure (mmHg): 126/59 Source: Stated Capillary Blood Glucose (mg/dl): 175 Body Mass Index (BMI): 26.8 Reference Range: 80 - 120 mg / dl Notes glucose per pt report today Electronic Signature(s) Signed: 05/28/2019 6:14:01 PM By: Baruch Gouty RN, BSN Entered By: Baruch Gouty on 05/28/2019 16:42:04

## 2019-06-01 ENCOUNTER — Ambulatory Visit: Payer: Medicare Other

## 2019-06-03 ENCOUNTER — Ambulatory Visit: Payer: Self-pay

## 2019-06-03 ENCOUNTER — Ambulatory Visit (INDEPENDENT_AMBULATORY_CARE_PROVIDER_SITE_OTHER): Payer: Medicare Other | Admitting: Physical Medicine and Rehabilitation

## 2019-06-03 ENCOUNTER — Other Ambulatory Visit: Payer: Self-pay

## 2019-06-03 VITALS — BP 117/62 | HR 83

## 2019-06-03 DIAGNOSIS — M5416 Radiculopathy, lumbar region: Secondary | ICD-10-CM

## 2019-06-03 DIAGNOSIS — M48062 Spinal stenosis, lumbar region with neurogenic claudication: Secondary | ICD-10-CM | POA: Diagnosis not present

## 2019-06-03 MED ORDER — DEXAMETHASONE SODIUM PHOSPHATE 10 MG/ML IJ SOLN
15.0000 mg | Freq: Once | INTRAMUSCULAR | Status: AC
  Administered 2019-06-03: 16:00:00 15 mg

## 2019-06-03 NOTE — Progress Notes (Signed)
    Numeric Pain Rating Scale and Functional Assessment Average Pain 5   In the last MONTH (on 0-10 scale) has pain interfered with the following?  1. General activity like being  able to carry out your everyday physical activities such as walking, climbing stairs, carrying groceries, or moving a chair?  Rating(2)   +Driver, -BT, -Dye Allergies.   

## 2019-06-08 ENCOUNTER — Ambulatory Visit: Payer: Medicare Other

## 2019-06-08 ENCOUNTER — Ambulatory Visit: Payer: Medicare Other | Attending: Internal Medicine

## 2019-06-08 DIAGNOSIS — Z23 Encounter for immunization: Secondary | ICD-10-CM | POA: Insufficient documentation

## 2019-06-08 NOTE — Procedures (Signed)
Lumbosacral Transforaminal Epidural Steroid Injection - Sub-Pedicular Approach with Fluoroscopic Guidance  Patient: [name]      Date of Birth: 1951-02-14 MRN: [id number] PCP: Fanny Bien, MD      Visit Date: 06/03/2019   Universal Protocol:    Date/Time: 06/03/2019  Consent Given By: the patient  Position: PRONE  Additional Comments: Vital signs were monitored before and after the procedure. Patient was prepped and draped in the usual sterile fashion. The correct patient, procedure, and site was verified.   Injection Procedure Details:  Procedure Site One Meds Administered:  Meds ordered this encounter  Medications  . dexamethasone (DECADRON) injection 15 mg    Laterality: Bilateral  Location/Site:  L3-L4  Needle size: 22 G  Needle type: Spinal  Needle Placement: Transforaminal  Findings:    -Comments: Excellent flow of contrast along the nerve and into the epidural space.  Procedure Details: After squaring off the end-plates to get a true AP view, the C-arm was positioned so that an oblique view of the foramen as noted above was visualized. The target area is just inferior to the "nose of the scotty dog" or sub pedicular. The soft tissues overlying this structure were infiltrated with 2-3 ml. of 1% Lidocaine without Epinephrine.  The spinal needle was inserted toward the target using a "trajectory" view along the fluoroscope beam.  Under AP and lateral visualization, the needle was advanced so it did not puncture dura and was located close the 6 O'Clock position of the pedical in AP tracterory. Biplanar projections were used to confirm position. Aspiration was confirmed to be negative for CSF and/or blood. A 1-2 ml. volume of Isovue-250 was injected and flow of contrast was noted at each level. Radiographs were obtained for documentation purposes.   After attaining the desired flow of contrast documented above, a 0.5 to 1.0 ml test dose of 0.25% Marcaine was  injected into each respective transforaminal space.  The patient was observed for 90 seconds post injection.  After no sensory deficits were reported, and normal lower extremity motor function was noted,   the above injectate was administered so that equal amounts of the injectate were placed at each foramen (level) into the transforaminal epidural space.   Additional Comments:  The patient tolerated the procedure well Dressing: 2 x 2 sterile gauze and Band-Aid    Post-procedure details: Patient was observed during the procedure. Post-procedure instructions were reviewed.  Patient left the clinic in stable condition.

## 2019-06-08 NOTE — Progress Notes (Signed)
[name] - 69 y.o. male MRN [id number]  Date of birth: October 28, 1950  Office Visit Note: Visit Date: 06/03/2019 PCP: Fanny Bien, MD Referred by: Fanny Bien, MD  Subjective: Chief Complaint  Patient presents with  . Lower Back - Pain   HPI: [name] is a 69 y.o. male who comes in today For planned repeat bilateral L3 transforaminal epidural steroid injection for severe stenosis at this level with bilateral low back pain left more than right hip and leg pain.  Leg pain is much better but still having referral pain in the hips.  His history is complicated by diabetes and diabetic neuropathy and nephropathy.  He is now in end-stage renal disease on dialysis.  Last injection in August was very beneficial for several months.  We can repeat that injection today has had no new trauma or other issues.  He has no focal weakness.  ROS Otherwise per HPI.  Assessment & Plan: Visit Diagnoses:  1. Lumbar radiculopathy   2. Spinal stenosis of lumbar region with neurogenic claudication     Plan: No additional findings.   Meds & Orders:  Meds ordered this encounter  Medications  . dexamethasone (DECADRON) injection 15 mg    Orders Placed This Encounter  Procedures  . XR C-ARM NO REPORT  . Epidural Steroid injection    Follow-up: Return if symptoms worsen or fail to improve.   Procedures: No procedures performed  Lumbosacral Transforaminal Epidural Steroid Injection - Sub-Pedicular Approach with Fluoroscopic Guidance  Patient: [name]      Date of Birth: 1950/06/04 MRN: [id number] PCP: Fanny Bien, MD      Visit Date: 06/03/2019   Universal Protocol:    Date/Time: 06/03/2019  Consent Given By: the patient  Position: PRONE  Additional Comments: Vital signs were monitored before and after the procedure. Patient was prepped and draped in the usual sterile fashion. The correct patient, procedure, and site was verified.   Injection Procedure Details:   Procedure Site One Meds Administered:  Meds ordered this encounter  Medications  . dexamethasone (DECADRON) injection 15 mg    Laterality: Bilateral  Location/Site:  L3-L4  Needle size: 22 G  Needle type: Spinal  Needle Placement: Transforaminal  Findings:    -Comments: Excellent flow of contrast along the nerve and into the epidural space.  Procedure Details: After squaring off the end-plates to get a true AP view, the C-arm was positioned so that an oblique view of the foramen as noted above was visualized. The target area is just inferior to the "nose of the scotty dog" or sub pedicular. The soft tissues overlying this structure were infiltrated with 2-3 ml. of 1% Lidocaine without Epinephrine.  The spinal needle was inserted toward the target using a "trajectory" view along the fluoroscope beam.  Under AP and lateral visualization, the needle was advanced so it did not puncture dura and was located close the 6 O'Clock position of the pedical in AP tracterory. Biplanar projections were used to confirm position. Aspiration was confirmed to be negative for CSF and/or blood. A 1-2 ml. volume of Isovue-250 was injected and flow of contrast was noted at each level. Radiographs were obtained for documentation purposes.   After attaining the desired flow of contrast documented above, a 0.5 to 1.0 ml test dose of 0.25% Marcaine was injected into each respective transforaminal space.  The patient was observed for 90 seconds post injection.  After no sensory deficits were reported, and normal lower extremity  motor function was noted,   the above injectate was administered so that equal amounts of the injectate were placed at each foramen (level) into the transforaminal epidural space.   Additional Comments:  The patient tolerated the procedure well Dressing: 2 x 2 sterile gauze and Band-Aid    Post-procedure details: Patient was observed during the procedure. Post-procedure  instructions were reviewed.  Patient left the clinic in stable condition.      Clinical History: MRI LUMBAR SPINE WITHOUT CONTRAST  TECHNIQUE: Multiplanar, multisequence MR imaging of the lumbar spine was performed. No intravenous contrast was administered.  COMPARISON:  10/12/2018 radiography  FINDINGS: Segmentation:  5 lumbar type vertebrae  Alignment:  Mild levo scoliotic curvature.  Vertebrae:  No fracture, evidence of discitis, or bone lesion.  Conus medullaris and cauda equina: Conus extends to the T12-L1 level. Conus and cauda equina appear normal.  Paraspinal and other soft tissues: Right upper pole renal cyst with apparent complexity likely related to motion.  Disc levels:  T11-12: Advanced disc narrowing with circumferential bulge and posterior element hypertrophy. Spinal stenosis with mild right cord impingement. Right more than left foraminal stenosis.  T12- L1: Left facet spurring.  Negative disc  L1-L2: Bilateral moderate facet spurring with negative disc. Mild spinal stenosis.  L2-L3: Moderate degenerative facet spurring.  Spondylosis.  L3-L4: Advanced facet arthropathy with 5 mm degenerative cyst in the ligamentum flavum. Minor bulging of the disc. Severe spinal stenosis. Both foramina are patent  L4-L5: Advanced disc narrowing with right eccentric protrusion compressing the right L5 nerve root in the subarticular recess. Degenerative facet spurring asymmetric to the right with mild/moderate right foraminal narrowing.  L5-S1:Degenerative facet spurring with asymmetric bulky hypertrophy on the right impinging on the right foraminal L5 nerve root.  IMPRESSION: 1. Generalized facet arthropathy most advanced at L3-4. There is superimposed advanced disc degeneration at T11-12 and L4-5. 2. L3-4 severe spinal stenosis related to facet hypertrophy and a ligamentum flavum ganglion. 3. T11-12 spinal stenosis with mild right cord  flattening and biforaminal impingement. 4. L4-5 right foraminal protrusion compressing the L5 nerve root. 5. L5-S1 moderate right foraminal impingement.   Electronically Signed   By: Monte Fantasia M.D.   On: 11/20/2018 10:56   He reports that he quit smoking about 30 years ago. His smoking use included cigars. He has never used smokeless tobacco.  Recent Labs    12/14/18 1739  HGBA1C 6.3*    Objective:  VS:  HT:    WT:   BMI:     BP:117/62  HR:83bpm  TEMP: ( )  RESP:  Physical Exam  Ortho Exam Imaging: No results found.  Past Medical/Family/Surgical/Social History: Medications & Allergies reviewed per EMR, new medications updated. Patient Active Problem List   Diagnosis Date Noted  . Cutaneous abscess of right foot   . Osteomyelitis ([place]) 12/14/2018  . AKI (acute kidney injury) ([place]) 12/14/2018  . CKD (chronic kidney disease) stage 4, GFR 15-29 ml/min (HCC) 12/14/2018  . Gait abnormality 08/24/2018  . Diabetic neuropathy ([place]) 02/06/2018  . Cervical myelopathy ([place]) 02/06/2018  . Onychomycosis 10/30/2017  . Other spondylosis with radiculopathy, cervical region 01/27/2017  . Midfoot ulcer, left, limited to breakdown of skin ([place]) 11/15/2016  . Lateral epicondylitis, left elbow 08/12/2016  . Cellulitis of fifth toe of right foot 07/29/2016  . Prostate cancer ([place]) 06/19/2016  . Non-pressure chronic ulcer of other part of right foot limited to breakdown of skin ([place]) 04/04/2016  . Cellulitis of leg, right 12/24/2015  .  Cellulitis of right lower extremity 12/24/2015  . CKD (chronic kidney disease), stage III 12/25/2013  . Foot infection 12/24/2013  . Foot ulcer, left ([place]) 12/24/2013  . Diabetic foot ulcer ([place]) 09/14/2012  . Gout 09/14/2012  . HTN (hypertension) 09/14/2012  . Hypothyroidism 09/14/2012  . CAD (coronary artery disease) 09/14/2012  . Diabetes mellitus ([place]) 09/14/2012  . Hypercholesteremia   . Neuropathy [facility])    Past Medical History:   Diagnosis Date  . Ambulates with cane    straight cane  . Cervical myelopathy ([place]) 02/06/2018  . Chronic kidney disease    dailysis [facility]  . Coronary artery disease   . Diabetes mellitus without complication ([place])    type2  . Diabetic foot ulcer ([place])   . Diabetic foot ulcer ([place])   . Diabetic neuropathy ([place]) 02/06/2018  . Gait abnormality 08/24/2018  . GERD (gastroesophageal reflux disease)   . History of kidney stones    passed stones  . Hypercholesteremia   . Hypertension   . Hypothyroidism   . Neuromuscular disorder (HCC)    neuropathy left leg and bilateral feet  . Neuropathy   . Prostate cancer [facility])    Family History  Problem Relation Age of Onset  . Diabetes Mellitus II Mother   . Diabetes Mellitus II Father   . CAD Father   . Cancer Father        prostate  . Diabetes Mellitus II Brother   . Diabetes Mellitus II Brother   . Cancer Brother    Past Surgical History:  Procedure Laterality Date  . AMPUTATION Left 12/25/2013   Procedure: AMPUTATION RAY LEFT 5TH RAY;  Surgeon: [name], MD;  Location: [facility];  Service: Orthopedics;  Laterality: Left;  . AMPUTATION Right 12/15/2018   Procedure: AMPUTATION OF 4TH AND 5TH TOES RIGHT FOOT;  Surgeon: [name], MD;  Location: [place];  Service: Orthopedics;  Laterality: Right;  . APPLICATION OF WOUND VAC Right 12/15/2018   Procedure: APPLICATION OF WOUND VAC;  Surgeon: [name], MD;  Location: [place];  Service: Orthopedics;  Laterality: Right;  . AV FISTULA PLACEMENT Left 03/25/2019   Procedure: LEFT ARM ARTERIOVENOUS (AV) FISTULA CREATION;  Surgeon: [name], MD;  Location: [place];  Service: Vascular;  Laterality: Left;  . BACK SURGERY    . BASCILIC VEIN TRANSPOSITION Left 05/20/2019   Procedure: SECOND STAGE LEFT BASCILIC VEIN TRANSPOSITION;  Surgeon: [name], MD;  Location: [place];  Service: Vascular;  Laterality: Left;  . CARDIAC CATHETERIZATION  02/17/2014  . CHOLECYSTECTOMY    . COLONOSCOPY     . CORONARY ARTERY BYPASS GRAFT    . I & D EXTREMITY Right 12/15/2018   Procedure: DEBRIDEMENT RIGHT FOOT;  Surgeon: [name], MD;  Location: [place];  Service: Orthopedics;  Laterality: Right;  . NECK SURGERY     novemver 2019  . WISDOM TOOTH EXTRACTION     Social History   Occupational History  . Not on file  Tobacco Use  . Smoking status: Former Smoker    Types: Cigars    Quit date: 12/24/1988    Years since quitting: 30.4  . Smokeless tobacco: Never Used  Substance and Sexual Activity  . Alcohol use: No  . Drug use: No  . Sexual activity: Yes    Partners: Female

## 2019-06-08 NOTE — Progress Notes (Signed)
   Covid-19 Vaccination Clinic  Name:  Askari Kinley    MRN: [id number] DOB: [birth year]  06/08/2019  Mr. Kross was observed post Covid-19 immunization for 15 minutes without incident. He was provided with Vaccine Information Sheet and instruction to access the V-Safe system.   Mr. Fotheringham was instructed to call 911 with any severe reactions post vaccine: Marland Kitchen Difficulty breathing  . Swelling of face and throat  . A fast heartbeat  . A bad rash all over body  . Dizziness and weakness   Immunizations Administered    Name Date Dose VIS Date Route   Pfizer COVID-19 Vaccine 06/08/2019  4:44 PM 0.3 mL 03/19/2019 Intramuscular   Manufacturer: [place]   Lot: [id number]   [place]: [address]-2

## 2019-06-21 ENCOUNTER — Ambulatory Visit: Payer: Medicare Other

## 2019-06-25 ENCOUNTER — Telehealth (HOSPITAL_COMMUNITY): Payer: Self-pay

## 2019-06-25 ENCOUNTER — Ambulatory Visit (INDEPENDENT_AMBULATORY_CARE_PROVIDER_SITE_OTHER): Payer: Medicare Other

## 2019-06-25 ENCOUNTER — Other Ambulatory Visit: Payer: Self-pay

## 2019-06-25 ENCOUNTER — Ambulatory Visit (INDEPENDENT_AMBULATORY_CARE_PROVIDER_SITE_OTHER): Payer: Medicare Other | Admitting: Internal Medicine

## 2019-06-25 ENCOUNTER — Encounter: Payer: Self-pay | Admitting: Internal Medicine

## 2019-06-25 VITALS — BP 118/56 | HR 87 | Temp 97.1°F | Ht 70.0 in | Wt 206.0 lb

## 2019-06-25 DIAGNOSIS — I6523 Occlusion and stenosis of bilateral carotid arteries: Secondary | ICD-10-CM | POA: Diagnosis not present

## 2019-06-25 DIAGNOSIS — R05 Cough: Secondary | ICD-10-CM

## 2019-06-25 DIAGNOSIS — R053 Chronic cough: Secondary | ICD-10-CM

## 2019-06-25 MED ORDER — METHYLPREDNISOLONE ACETATE 80 MG/ML IJ SUSP
120.0000 mg | Freq: Once | INTRAMUSCULAR | Status: AC
  Administered 2019-06-25: 120 mg via INTRAMUSCULAR

## 2019-06-25 MED ORDER — FAMOTIDINE 20 MG PO TABS: ORAL_TABLET | ORAL | 11 refills | Status: DC

## 2019-06-25 MED ORDER — PANTOPRAZOLE SODIUM 40 MG PO TBEC: 40.0000 mg | DELAYED_RELEASE_TABLET | Freq: Every day | ORAL | 2 refills | Status: DC

## 2019-06-25 MED ORDER — BENZONATATE 200 MG PO CAPS: 200.0000 mg | ORAL_CAPSULE | Freq: Three times a day (TID) | ORAL | 1 refills | Status: DC | PRN

## 2019-06-25 NOTE — Telephone Encounter (Signed)

## 2019-06-25 NOTE — Progress Notes (Addendum)
[name], male    DOB: 1950/05/04,     MRN: [id number]   Brief patient profile:  28 yowm quit smoking 1990 no sequelae with  hbp /dm/ IHD s/p cabg 2005 [place], [place] on HD since early  of Jan 2021 non-stop since so referred to pulmonary clinic 06/25/2019 by Dr   Deterding    History of Present Illness  06/25/2019  Pulmonary/ 1st office eval/Duanna Runk  Chief Complaint  Patient presents with  . Pulmonary Consult    Referred by Dr [name]. Pt c/o cough x 3 months- non prod.   Dyspnea:  Not limited by breathing from desired activities  Including wt lifting  Cough: indolent onset 3 m prior to OV   of persitent daily dry tickle in throat not really much production at all, no assoc nasal symptoms or prior h/o seasonal rhinitis or even much problem with colds in past. Sleep: at hs worse ? Better p inhaler, tessalon immediately on waking/ bed is flat with two big pillws and dog in bedroom  SABA use: 4 total per per day  Lots of halls not helping  .     Kouffman Reflux v Neurogenic Cough Differentiator Reflux Comments  Do you awaken from a sound sleep coughing violently?                            With trouble breathing? No sometimes   Do you have choking episodes when you cannot  Get enough air, gasping for air ?              When it's severe   Do you usually cough when you lie down into  The bed, or when you just lie down to rest ?                          Yes   Do you usually cough after meals or eating?         No   Do you cough when (or after) you bend over?    no   GERD SCORE     Kouffman Reflux v Neurogenic Cough Differentiator Neurogenic   Do you more-or-less cough all day long? sporadic   Does change of temperature make you cough? Cold    Does laughing or chuckling cause you to cough? no   Do fumes (perfume, automobile fumes, burned  Toast, etc.,) cause you to cough ?      no   Does speaking, singing, or talking on the phone cause you to cough   ?               Ye      Neurogenic/Airway score     No obvious other patterns in day to day or daytime variability or assoc excess/ purulent sputum or mucus plugs or hemoptysis or cp or chest tightness, subjective wheeze or overt sinus or hb symptoms.     Also denies any obvious fluctuation of symptoms with weather or environmental changes or other aggravating or alleviating factors except as outlined above   No unusual exposure hx or h/o childhood pna/ asthma or knowledge of premature birth.  Current Allergies, Complete Past Medical History, Past Surgical History, Family History, and Social History were reviewed in [employer] record.  ROS  The following are not active complaints unless bolded Hoarseness, sore throat, dysphagia, dental problems, itching, sneezing,  nasal congestion or  discharge of excess mucus or purulent secretions, ear ache,   fever, chills, sweats, unintended wt loss or wt gain, classically pleuritic or exertional cp,  orthopnea pnd or arm/hand swelling  or leg swelling, presyncope, palpitations, abdominal pain, anorexia, nausea, vomiting, diarrhea  or change in bowel habits or change in bladder habits, change in stools or change in urine, dysuria, hematuria,  rash, arthralgias, visual complaints, headache, numbness, weakness or ataxia or problems with walking or coordination,  change in mood or  memory.           Past Medical History:  Diagnosis Date  . Ambulates with cane    straight cane  . Cervical myelopathy ([place]) 02/06/2018  . Chronic kidney disease    dailysis [facility]  . Coronary artery disease   . Diabetes mellitus without complication ([place])    type2  . Diabetic foot ulcer ([place])   . Diabetic foot ulcer ([place])   . Diabetic neuropathy ([place]) 02/06/2018  . Gait abnormality 08/24/2018  . GERD (gastroesophageal reflux disease)   . History of kidney stones    passed stones  . Hypercholesteremia   . Hypertension   . Hypothyroidism   . Neuromuscular disorder (HCC)     neuropathy left leg and bilateral feet  . Neuropathy   . Prostate cancer [facility])     Outpatient Medications Prior to Visit  Medication Sig Dispense Refill  . albuterol (VENTOLIN HFA) 108 (90 Base) MCG/ACT inhaler Inhale 2 puffs into the lungs every 4 (four) hours as needed for wheezing or shortness of breath. 18 g 0  . allopurinol (ZYLOPRIM) 100 MG tablet Take 200 mg by mouth daily.     . Alprostadil, Vasodilator, (CAVERJECT IC) 1 Dose by Intracavernosal route daily as needed (erectile dysfunction).    Marland Kitchen aspirin EC 81 MG tablet Take 81 mg by mouth every morning.     . diclofenac Sodium (VOLTAREN) 1 % GEL Apply 1 application topically daily as needed for pain.    Marland Kitchen insulin degludec (TRESIBA FLEXTOUCH) 200 UNIT/ML FlexTouch Pen Inject 20 Units into the skin daily.    Marland Kitchen levothyroxine (SYNTHROID, LEVOTHROID) 112 MCG tablet Take 112 mcg by mouth daily before breakfast. BRAND NAME SYNTHROID    . pregabalin (LYRICA) 150 MG capsule Take 1 capsule by mouth in the morning and at bedtime.    . rosuvastatin (CRESTOR) 10 MG tablet Take 10 mg by mouth daily.     . Semaglutide, 1 MG/DOSE, (OZEMPIC, 1 MG/DOSE,) 2 MG/1.5ML SOPN Inject 0.5 mg into the skin every Friday.     Marland Kitchen acetaminophen (OFIRMEV) 10 MG/ML SOLN Take by mouth.    . Carboxymethylcellul-Glycerin (LUBRICATING EYE DROPS OP) Place 1 drop into both eyes daily as needed (dry eyes).    . carvedilol (COREG) 6.25 MG tablet Take 6.25 mg by mouth 2 (two) times daily.    . DULoxetine (CYMBALTA) 60 MG capsule Take 1 capsule (60 mg total) by mouth daily. (Patient not taking: Reported on 05/13/2019) 90 capsule 1  . lanthanum (FOSRENOL) 1000 MG chewable tablet Chew 1,000 mg by mouth 3 (three) times daily with meals.    . nitroGLYCERIN (NITRODUR - DOSED IN MG/24 HR) 0.2 mg/hr patch Place 1 patch (0.2 mg total) onto the skin daily. (Patient not taking: Reported on 05/13/2019) 30 patch 12     Objective:     BP (!) 118/56 (BP Location: Left Arm, Cuff Size:  Normal)   Pulse 87   Temp (!) 97.1 F (36.2 C) (Temporal)  Ht 5\' 10"  (1.778 m)   Wt 206 lb (93.4 kg)   SpO2 98% Comment: on RA  BMI 29.56 kg/m   SpO2: 98 %(on RA)   amb wm freq throat clearing evoving to dry coughing spasms   HEENT : pt wearing mask not removed for exam due to covid -19 concerns.    NECK :  without JVD/Nodes/TM/ nl carotid upstrokes bilaterally/HD catheter R [place]   LUNGS: no acc muscle use,  Nl contour chest which is clear to A and P bilaterally without cough on insp or exp maneuvers   CV:  RRR  no s3 or murmur or increase in P2, and no edema   ABD:  soft and nontender with nl inspiratory excursion in the supine position. No bruits or organomegaly appreciated, bowel sounds nl  MS:  Nl gait/ ext warm without deformities, calf tenderness, cyanosis or clubbing Brace on L ankle   SKIN: warm and dry without lesions    NEURO:  alert, approp, nl sensorium with  no  cerebellar deficits apparent/ wears L ankle brace for foot drop       I personally reviewed images and agree with radiology impression as follows:  CXR:   PA and Lateral 04/17/19 Small pleural effusions bilaterally with bibasilar atelectasis. Lungs elsewhere clear. Heart upper normal in size. Postoperative changes noted. No evident adenopathy.  Vs 06/25/2019 : my interpretation:  No effusions, now, clear lungs   Labs ordered 06/25/2019  :  allergy profile          Assessment   No problem-specific Assessment & Plan notes found for this encounter.     Christinia Gully, MD 06/25/2019

## 2019-06-25 NOTE — Patient Instructions (Addendum)
Pantoprazole (protonix) 40 mg   Take  30-60 min before first meal of the day and Pepcid (famotidine)  20 mg one after supper  until return to office - this is the best way to tell whether stomach acid is contributing to your problem.    GERD (REFLUX)  is an extremely common cause of respiratory symptoms just like yours , many times with no obvious heartburn at all.    It can be treated with medication, but also with lifestyle changes including elevation of the head of your bed (ideally with 6 -8inch blocks under the headboard of your bed),  Smoking cessation, avoidance of late meals, excessive alcohol, and avoid fatty foods, chocolate, peppermint, colas, red wine, and acidic juices such as orange juice.  NO MINT OR MENTHOL PRODUCTS SO NO COUGH DROPS  USE SUGARLESS CANDY INSTEAD (Jolley ranchers or Stover's or Life Savers) or even ice chips will also do - the key is to swallow to prevent all throat clearing. NO OIL BASED VITAMINS - use powdered substitutes.  Avoid fish oil when coughing.  For cough > tessalon 200 mg every 6-8 hours to eliminate all the coughing   Depomedrol 120 mg IM today   Please remember to go to the lab and x-ray department   for your tests - we will call you with the results when they are available.     Please schedule a follow up office visit in 4 weeks, sooner if needed

## 2019-06-26 ENCOUNTER — Encounter: Payer: Self-pay | Admitting: Internal Medicine

## 2019-06-26 NOTE — Assessment & Plan Note (Signed)
Onset around 1st of 2021  - Allergy profile 06/25/2019 >  Eos .2 /  IgE   - max gerd rx 06/25/2019   Of the three most common causes of  Sub-acute / recurrent or chronic cough, only one (GERD)  can actually contribute to/ trigger  the other two (asthma and post nasal drip syndrome)  and perpetuate the cylce of cough.  While not intuitively obvious, many patients with chronic low grade reflux do not cough until there is a primary insult that disturbs the protective epithelial barrier and exposes sensitive nerve endings.   This is typically viral but can due to PNDS and  either may apply here.     >>>The point is that once this occurs, it is difficult to eliminate the cycle  using anything but a maximally effective acid suppression regimen at least in the short run, accompanied by an appropriate diet to address non acid GERD and control / eliminate the cough itself to the extent possible with tessalon 200 mg and avoid narcs/ cough drops plus one dose Depomedrol 120 IM  in case of component of Th-2 driven upper or lower airways inflammation (if cough responds short term only to relapse befor return while will on rx for uacs that would point to allergic rhinitis/ asthma or eos bronchitis)    Discussed in detail all the  indications, usual  risks and alternatives  relative to the benefits with patient who agrees to proceed with empirical  as outlined.    Advised: The standardized cough guidelines published in Chest by Lissa Morales in 2006 are still the best available and consist of a multiple step process (up to 12!) , not a single office visit,  and are intended  to address this problem logically,  with an alogrithm dependent on response to empiric treatment at  each progressive step  to determine a specific diagnosis with  minimal addtional testing needed. Therefore if adherence is an issue or can't be accurately verified,  it's very unlikely the standard evaluation and treatment will be successful here.     Furthermore, response to therapy (other than acute cough suppression, which should only be used short term with avoidance of narcotic containing cough syrups if possible), can be a gradual process for which the patient is not likely to  perceive immediate benefit.  Unlike going to an eye doctor where the best perscription is almost always the first one and is immediately effective, this is almost never the case in the management of chronic cough syndromes. Therefore the patient needs to commit up front to consistently adhere to recommendations  for up to 4-6  weeks of therapy directed at the likely underlying problem(s) before the response can be reasonably evaluated.            Each maintenance medication was reviewed in detail including emphasizing most importantly the difference between maintenance and prns and under what circumstances the prns are to be triggered using an action plan format where appropriate.  Total time for H and P, chart review, counseling,   and generating customized AVS unique to this office visit / charting = 60 min

## 2019-06-28 ENCOUNTER — Encounter: Payer: Self-pay | Admitting: Internal Medicine

## 2019-06-28 ENCOUNTER — Telehealth: Payer: Self-pay | Admitting: Internal Medicine

## 2019-06-28 ENCOUNTER — Ambulatory Visit (INDEPENDENT_AMBULATORY_CARE_PROVIDER_SITE_OTHER): Payer: Self-pay | Admitting: Physician Assistant

## 2019-06-28 ENCOUNTER — Other Ambulatory Visit: Payer: Self-pay

## 2019-06-28 VITALS — BP 143/71 | HR 75 | Temp 97.2°F | Resp 16 | Ht 70.0 in | Wt 200.0 lb

## 2019-06-28 DIAGNOSIS — N186 End stage renal disease: Secondary | ICD-10-CM

## 2019-06-28 LAB — RESPIRATORY ALLERGY PROFILE REGION II ~~LOC~~
Allergen, A. alternata, m6: <0.1 kU/L
Allergen, Cedar tree, t12: <0.1 kU/L
Allergen, Comm Silver Birch, t9: <0.1 kU/L
Allergen, Cottonwood, t14: <0.1 kU/L
Allergen, D pternoyssinus,d7: <0.1 kU/L
Allergen, Mouse Urine Protein, e78: <0.1 kU/L
Allergen, Mulberry, t76: <0.1 kU/L
Allergen, Oak,t7: <0.1 kU/L
Allergen, P. notatum, m1: <0.1 kU/L
Aspergillus fumigatus, m3: <0.1 kU/L
Bermuda Grass: <0.1 kU/L
Box Elder IgE: <0.1 kU/L
CLADOSPORIUM HERBARUM (M2) IGE: <0.1 kU/L
COMMON RAGWEED (SHORT) (W1) IGE: <0.1 kU/L
Cat Dander: <0.1 kU/L
Class: 0
Class: 0
Class: 0
Class: 0
Class: 0
Class: 0
Class: 0
Class: 0
Class: 0
Class: 0
Class: 0
Class: 0
Class: 0
Class: 0
Class: 0
Class: 0
Class: 0
Class: 0
Class: 0
Class: 0
Class: 0
Class: 0
Class: 0
Class: 0
Cockroach: <0.1 kU/L
D. farinae: <0.1 kU/L
Dog Dander: <0.1 kU/L
Elm IgE: <0.1 kU/L
IgE (Immunoglobulin E), Serum: 10 kU/L (ref <=114)
Johnson Grass: <0.1 kU/L
Pecan/Hickory Tree IgE: <0.1 kU/L
Rough Pigweed  IgE: <0.1 kU/L
Sheep Sorrel IgE: <0.1 kU/L
Timothy Grass: <0.1 kU/L

## 2019-06-28 LAB — INTERPRETATION:

## 2019-06-28 LAB — CBC WITH DIFFERENTIAL/PLATELET
Absolute Monocytes: 587 cells/uL (ref 200–950)
Basophils Absolute: 90 cells/uL (ref 0–200)
Basophils Relative: 1.3 %
Eosinophils Absolute: 221 cells/uL (ref 15–500)
Eosinophils Relative: 3.2 %
HCT: 30.6 % — ABNORMAL LOW (ref 38.5–50.0)
Hemoglobin: 10.3 g/dL — ABNORMAL LOW (ref 13.2–17.1)
Lymphs Abs: 780 cells/uL — ABNORMAL LOW (ref 850–3900)
MCH: 28.9 pg (ref 27.0–33.0)
MCHC: 33.7 g/dL (ref 32.0–36.0)
MCV: 85.7 fL (ref 80.0–100.0)
MPV: 10.2 fL (ref 7.5–12.5)
Monocytes Relative: 8.5 %
Neutro Abs: 5223 cells/uL (ref 1500–7800)
Neutrophils Relative %: 75.7 %
Platelets: 220 10*3/uL (ref 140–400)
RBC: 3.57 10*6/uL — ABNORMAL LOW (ref 4.20–5.80)
RDW: 15.8 % — ABNORMAL HIGH (ref 11.0–15.0)
Total Lymphocyte: 11.3 %
WBC: 6.9 10*3/uL (ref 3.8–10.8)

## 2019-06-28 NOTE — Telephone Encounter (Signed)
OK to schedule screening colonoscopy direct

## 2019-06-28 NOTE — Progress Notes (Signed)
Spoke with pt and notified of results per Dr. Wert. Pt verbalized understanding and denied any questions. 

## 2019-06-28 NOTE — Telephone Encounter (Signed)
Dr. Carlean Purl, pt was urgently referred by Dr. [name] for a colonoscopy prior to kidney transplant.  Records from [facility] have been printed and will be sent to you for review.

## 2019-06-28 NOTE — Progress Notes (Signed)
POST OPERATIVE OFFICE NOTE    CC:  F/u for surgery  HPI:  This is a 69 y.o. male who is s/p second stage left basilic vein transposition by Dr. Trula Slade on May 20, 2019.Initial left arm AV fistula creation performed March 25, 2019  He dialyzes via right IJ TDC. He dialyzes on Mondays, Wednesdays and Fridays at [facility] location  He continues to have mild tingling and numbness in his left fingertips and also coldness. He denies any pain in left upper arm, left forearm, or left hand/fingers. No wounds on fingers or hand.  Allergies  Allergen Reactions  . Mushroom Extract Complex Nausea Only    Current Outpatient Medications  Medication Sig Dispense Refill  . albuterol (VENTOLIN HFA) 108 (90 Base) MCG/ACT inhaler Inhale 2 puffs into the lungs every 4 (four) hours as needed for wheezing or shortness of breath. 18 g 0  . allopurinol (ZYLOPRIM) 100 MG tablet Take 200 mg by mouth daily.     . Alprostadil, Vasodilator, (CAVERJECT IC) 1 Dose by Intracavernosal route daily as needed (erectile dysfunction).    Marland Kitchen aspirin EC 81 MG tablet Take 81 mg by mouth every morning.     . benzonatate (TESSALON) 200 MG capsule Take 1 capsule (200 mg total) by mouth 3 (three) times daily as needed for cough. 45 capsule 1  . diclofenac Sodium (VOLTAREN) 1 % GEL Apply 1 application topically daily as needed for pain.    . famotidine (PEPCID) 20 MG tablet One after supper 30 tablet 11  . insulin degludec (TRESIBA FLEXTOUCH) 200 UNIT/ML FlexTouch Pen Inject 20 Units into the skin daily.    Marland Kitchen levothyroxine (SYNTHROID, LEVOTHROID) 112 MCG tablet Take 112 mcg by mouth daily before breakfast. BRAND NAME SYNTHROID    . pantoprazole (PROTONIX) 40 MG tablet Take 1 tablet (40 mg total) by mouth daily. Take 30-60 min before first meal of the day 30 tablet 2  . pregabalin (LYRICA) 150 MG capsule Take 1 capsule by mouth in the morning and at bedtime.    . rosuvastatin (CRESTOR) 10 MG tablet Take 10 mg by mouth  daily.     . Semaglutide, 1 MG/DOSE, (OZEMPIC, 1 MG/DOSE,) 2 MG/1.5ML SOPN Inject 0.5 mg into the skin every Friday.      No current facility-administered medications for this visit.     ROS: Review of Systems  Constitutional: Negative for chills, fever and malaise/fatigue.  Respiratory: Negative for cough and shortness of breath.   Cardiovascular: Negative for chest pain, palpitations, claudication and leg swelling.  Gastrointestinal: Negative for abdominal pain, constipation, diarrhea, nausea and vomiting.  Musculoskeletal: Negative for joint pain.  Neurological: Negative for dizziness, weakness and headaches.     Physical Exam: General: well nourished, well appearing, not in any discomfort Cardiac regular rate and rhythm, no murmurs Lungs: non labored, no wheezing, equal expansion bilaterally Incision:  Left upper extremity incisions healed, some skin glue still present. No swelling, no erythema or ecchymsis, no tenderness. Good bruit/ thrill in left basilic vein fistula Extremities:  Bilateral lower extremities well perfused, warm. No ulcers. No swelling or deformities. 2+ radial pulses bilaterally, normal grip strength , left finger tips cooler than right Neuro: Alert and oriented   Assessment/Plan:  This is a 69 y.o. male who is s/p second stage left basilic vein transposition 05/20/19 by Dr. Trula Slade. His arm is well healed. Great bruit and palpable thrill.  - he has mild steal symptoms in left upper extremity. I re discussed this with him  today and discussed that his symptoms may or may not become more pronounced once fistula is being used. Right now they are tolerable. He will follow up if he develops worsening pain or wounds - He is eager to have his TDC removed - He can start using his fistula for dialysis access. Once the left BV fistula access has been used successfully to the satisfaction of the dialysis center, the tunneled catheter can be removed at the discretion of the  dialysis center - He will follow up as needed or if he has any issues with his fistula or worsening steal symptoms   Paulo Fruit, PA-C [facility] [phone]  Clinic MD:  Dr. Trula Slade

## 2019-06-29 ENCOUNTER — Encounter: Payer: Self-pay | Admitting: Family Medicine

## 2019-06-30 ENCOUNTER — Telehealth: Payer: Self-pay

## 2019-06-30 NOTE — Telephone Encounter (Signed)
Pt left VM on triage line regarding going to HD today and being unable to have HD today due to facility not being aware that his fistula can start being used. I spoke to [place] at [facility] and read her the PA's note from pt's appt on 3/22 stating his fistula can start being accessed.

## 2019-07-02 ENCOUNTER — Ambulatory Visit: Payer: Medicare Other | Admitting: Cardiology

## 2019-07-20 ENCOUNTER — Ambulatory Visit (AMBULATORY_SURGERY_CENTER): Payer: Self-pay | Admitting: *Deleted

## 2019-07-20 ENCOUNTER — Other Ambulatory Visit: Payer: Self-pay

## 2019-07-20 VITALS — Temp 97.1°F | Ht 70.0 in | Wt 209.2 lb

## 2019-07-20 DIAGNOSIS — Z1211 Encounter for screening for malignant neoplasm of colon: Secondary | ICD-10-CM

## 2019-07-20 NOTE — Progress Notes (Signed)
Pt has dialysis on MWF No egg or soy allergy known to patient  No issues with past sedation with any surgeries  or procedures, no intubation problems  No diet pills per patient No home 02 use per patient  No blood thinners per patient  Pt denies issues with constipation  No A fib or A flutter  EMMI video sent to pt's e mail   Due to the COVID-19 pandemic we are asking patients to follow these guidelines. Please only bring one care partner. Please be aware that your care partner may wait in the car in the parking lot or if they feel like they will be too hot to wait in the car, they may wait in the lobby on the 4th floor. All care partners are required to wear a mask the entire time (we do not have any that we can provide them), they need to practice social distancing, and we will do a Covid check for all patient's and care partners when you arrive. Also we will check their temperature and your temperature. If the care partner waits in their car they need to stay in the parking lot the entire time and we will call them on their cell phone when the patient is ready for discharge so they can bring the car to the front of the building. Also all patient's will need to wear a mask into building.

## 2019-07-22 ENCOUNTER — Ambulatory Visit (INDEPENDENT_AMBULATORY_CARE_PROVIDER_SITE_OTHER): Payer: Medicare Other

## 2019-07-22 ENCOUNTER — Ambulatory Visit (INDEPENDENT_AMBULATORY_CARE_PROVIDER_SITE_OTHER): Payer: Medicare Other | Admitting: Physician Assistant

## 2019-07-22 ENCOUNTER — Encounter: Payer: Self-pay | Admitting: Orthopedic Surgery

## 2019-07-22 ENCOUNTER — Other Ambulatory Visit: Payer: Self-pay

## 2019-07-22 DIAGNOSIS — M86271 Subacute osteomyelitis, right ankle and foot: Secondary | ICD-10-CM | POA: Diagnosis not present

## 2019-07-22 NOTE — Progress Notes (Signed)
Office Visit Note   Patient: [name]           Date of Birth: 07-30-1950           MRN: [id number] Visit Date: 07/22/2019              Requested by: Lorenda Hatchet, [place] [address],  [place] [address] PCP: Lorenda Hatchet, FNP  Chief Complaint  Patient presents with  . Right Foot - Follow-up      HPI: This is a pleasant gentleman who is here for follow-up for his right foot he is status post ray amputations fourth and fifth.  He was doing well.  He actually had been seen by the wound center and was released.  Over the weekend he became concerned as he started having drainage on the lateral side of his amputation site.  He denies any fever chills  Assessment & Plan: Visit Diagnoses:  1. Subacute osteomyelitis of right foot ([place])     Plan: Patient should offload this and go back on his knee scooter to the best of his ability.  He may do daily dressing changes with his silver cell.  He will follow-up in 3 weeks  Follow-Up Instructions: No follow-ups on file.   Ortho Exam  Patient is alert, oriented, no adenopathy, well-dressed, normal affect, normal respiratory effort. Focused examination demonstrates no cellulitis no foul odor he does have a superficial ulcer approximately 2 cm in diameter.  Another small 1/4 cm in diameter.  Neither have any purulent drainage or surrounding cellulitis they do not probe deeply  Imaging: No results found. No images are attached to the encounter.  Labs: Lab Results  Component Value Date   HGBA1C 6.3 (H) 12/14/2018   HGBA1C 6.0 (H) 12/24/2015   HGBA1C 7.0 (H) 12/25/2013   ESRSEDRATE 131 (H) 12/14/2018   ESRSEDRATE 16 02/06/2018   ESRSEDRATE 53 (H) 09/15/2012   CRP 32.1 (H) 12/14/2018   REPTSTATUS 12/18/2018 FINAL 12/15/2018   GRAMSTAIN  12/15/2018    FEW WBC PRESENT,BOTH PMN AND MONONUCLEAR FEW GRAM POSITIVE COCCI FEW GRAM NEGATIVE RODS RARE GRAM POSITIVE RODS    CULT  12/15/2018    FEW  ESCHERICHIA COLI FEW CORYNEBACTERIUM STRIATUM FEW PREVOTELLA MELANINOGENICA BETA LACTAMASE POSITIVE Performed at [facility], [place] [address] [place], [place] [address]    LABORGA ESCHERICHIA COLI 12/15/2018     Lab Results  Component Value Date   ALBUMIN 3.0 (L) 12/14/2018   ALBUMIN 3.7 12/24/2015   ALBUMIN 3.7 12/28/2014   PREALBUMIN 14.8 (L) 12/14/2018    No results found for: MG No results found for: VD25OH  Lab Results  Component Value Date   PREALBUMIN 14.8 (L) 12/14/2018   CBC EXTENDED Latest Ref Rng & Units 06/25/2019 05/20/2019 03/25/2019  WBC 3.8 - 10.8 Thousand/uL 6.9 - -  RBC 4.20 - 5.80 Million/uL 3.57(L) - -  HGB 13.2 - 17.1 g/dL 10.3(L) 9.5(L) 9.2(L)  HCT 38.5 - 50.0 % 30.6(L) 28.0(L) 27.0(L)  PLT 140 - 400 Thousand/uL 220 - -  NEUTROABS 1,500 - 7,800 cells/uL 5,223 - -  LYMPHSABS 850 - 3,900 cells/uL 780(L) - -     There is no height or weight on file to calculate BMI.  Orders:  Orders Placed This Encounter  Procedures  . XR Foot 2 Views Right   No orders of the defined types were placed in this encounter.    Procedures: No procedures performed  Clinical Data: No additional  findings.  ROS:  All other systems negative, except as noted in the HPI. Review of Systems  Objective: Vital Signs: There were no vitals taken for this visit.  Specialty Comments:  No specialty comments available.  PMFS History: Patient Active Problem List   Diagnosis Date Noted  . Chronic cough 06/25/2019  . Cutaneous abscess of right foot   . Osteomyelitis ([place]) 12/14/2018  . AKI (acute kidney injury) ([place]) 12/14/2018  . CKD (chronic kidney disease) stage 4, GFR 15-29 ml/min (HCC) 12/14/2018  . Gait abnormality 08/24/2018  . Diabetic neuropathy ([place]) 02/06/2018  . Cervical myelopathy ([place]) 02/06/2018  . Onychomycosis 10/30/2017  . Other spondylosis with radiculopathy, cervical region 01/27/2017  . Midfoot ulcer, left, limited to breakdown of skin  ([place]) 11/15/2016  . Lateral epicondylitis, left elbow 08/12/2016  . Cellulitis of fifth toe of right foot 07/29/2016  . Prostate cancer ([place]) 06/19/2016  . Non-pressure chronic ulcer of other part of right foot limited to breakdown of skin ([place]) 04/04/2016  . Cellulitis of leg, right 12/24/2015  . Cellulitis of right lower extremity 12/24/2015  . CKD (chronic kidney disease), stage III 12/25/2013  . Foot infection 12/24/2013  . Foot ulcer, left ([place]) 12/24/2013  . Diabetic foot ulcer ([place]) 09/14/2012  . Gout 09/14/2012  . HTN (hypertension) 09/14/2012  . Hypothyroidism 09/14/2012  . CAD (coronary artery disease) 09/14/2012  . Diabetes mellitus ([place]) 09/14/2012  . Hypercholesteremia   . Neuropathy [facility])    Past Medical History:  Diagnosis Date  . Ambulates with cane    straight cane  . Cervical myelopathy ([place]) 02/06/2018  . Chronic kidney disease    dailysis M [facility] F  . Coronary artery disease   . Diabetes mellitus without complication ([place])    type2  . Diabetic foot ulcer ([place])   . Diabetic foot ulcer ([place])   . Diabetic neuropathy ([place]) 02/06/2018  . Gait abnormality 08/24/2018  . GERD (gastroesophageal reflux disease)   . History of kidney stones    passed stones  . Hypercholesteremia   . Hypertension   . Hypothyroidism   . Neuromuscular disorder (HCC)    neuropathy left leg and bilateral feet  . Neuropathy   . Prostate cancer [facility])     Family History  Problem Relation Age of Onset  . Diabetes Mellitus II Mother   . Diabetes Mellitus II Father   . CAD Father   . Cancer Father        prostate  . Diabetes Mellitus II Brother   . Diabetes Mellitus II Brother   . Stomach cancer Brother 40  . Colon cancer Neg Hx   . Colon polyps Neg Hx   . Esophageal cancer Neg Hx   . Rectal cancer Neg Hx     Past Surgical History:  Procedure Laterality Date  . AMPUTATION Left 12/25/2013   Procedure: AMPUTATION RAY LEFT 5TH RAY;  Surgeon: [name], MD;  Location: [facility];   Service: Orthopedics;  Laterality: Left;  . AMPUTATION Right 12/15/2018   Procedure: AMPUTATION OF 4TH AND 5TH TOES RIGHT FOOT;  Surgeon: [name], MD;  Location: [place];  Service: Orthopedics;  Laterality: Right;  . APPLICATION OF WOUND VAC Right 12/15/2018   Procedure: APPLICATION OF WOUND VAC;  Surgeon: [name], MD;  Location: [place];  Service: Orthopedics;  Laterality: Right;  . AV FISTULA PLACEMENT Left 03/25/2019   Procedure: LEFT ARM ARTERIOVENOUS (AV) FISTULA CREATION;  Surgeon: [name], MD;  [facility]  OR;  Service: Vascular;  Laterality: Left;  . BACK SURGERY    . BASCILIC VEIN TRANSPOSITION Left 05/20/2019   Procedure: SECOND STAGE LEFT BASCILIC VEIN TRANSPOSITION;  Surgeon: [name], MD;  Location: [place];  Service: Vascular;  Laterality: Left;  . CARDIAC CATHETERIZATION  02/17/2014  . CHOLECYSTECTOMY    . COLONOSCOPY  2011   in [place], [place] GRAFT  2008  . I & D EXTREMITY Right 12/15/2018   Procedure: DEBRIDEMENT RIGHT FOOT;  Surgeon: [name], MD;  Location: [place];  Service: Orthopedics;  Laterality: Right;  . NECK SURGERY     novemver 2019  . WISDOM TOOTH EXTRACTION     Social History   Occupational History  . Not on file  Tobacco Use  . Smoking status: Former Smoker    Types: Cigars    Quit date: 12/24/1988    Years since quitting: 30.5  . Smokeless tobacco: Never Used  Substance and Sexual Activity  . Alcohol use: No  . Drug use: No  . Sexual activity: Yes    Partners: Female

## 2019-07-23 ENCOUNTER — Ambulatory Visit (INDEPENDENT_AMBULATORY_CARE_PROVIDER_SITE_OTHER): Payer: Medicare Other | Admitting: Internal Medicine

## 2019-07-23 ENCOUNTER — Encounter: Payer: Self-pay | Admitting: Internal Medicine

## 2019-07-23 DIAGNOSIS — I6523 Occlusion and stenosis of bilateral carotid arteries: Secondary | ICD-10-CM

## 2019-07-23 DIAGNOSIS — R05 Cough: Secondary | ICD-10-CM | POA: Diagnosis not present

## 2019-07-23 DIAGNOSIS — R053 Chronic cough: Secondary | ICD-10-CM

## 2019-07-23 NOTE — Progress Notes (Signed)
[name], male    DOB: 19-Sep-1950,     MRN: [id number]   Brief patient profile:  89 yowm quit smoking 1990 no sequelae with  hbp /dm/ IHD s/p cabg 2005 [place], [place] on HD since early  of Jan 2021 non-stop since so referred to pulmonary clinic 06/25/2019 by Dr   Deterding    History of Present Illness  06/25/2019  Pulmonary/ 1st office eval/[name]  Chief Complaint  Patient presents with  . Pulmonary Consult    Referred by Dr [name]. Pt c/o cough x 3 months- non prod.   Dyspnea:  Not limited by breathing from desired activities  Including wt lifting  Cough: indolent onset 3 m prior to OV   of persitent daily dry tickle in throat not really much production at all, no assoc nasal symptoms or prior h/o seasonal rhinitis or even much problem with colds in past. Sleep: at hs worse ? Better p inhaler, tessalon immediately on waking/ bed is flat with two big pillws and dog in bedroom  SABA use: 4 total per per day  Lots of halls not helping  Kouffman Reflux v Neurogenic Cough Differentiator Reflux Comments  Do you awaken from a sound sleep coughing violently?                            With trouble breathing? No sometimes   Do you have choking episodes when you cannot  Get enough air, gasping for air ?              When it's severe   Do you usually cough when you lie down into  The bed, or when you just lie down to rest ?                          Yes   Do you usually cough after meals or eating?         No   Do you cough when (or after) you bend over?    no   GERD SCORE     Kouffman Reflux v Neurogenic Cough Differentiator Neurogenic   Do you more-or-less cough all day long? sporadic   Does change of temperature make you cough? Cold    Does laughing or chuckling cause you to cough? no   Do fumes (perfume, automobile fumes, burned  Toast, etc.,) cause you to cough ?      no   Does speaking, singing, or talking on the phone cause you to cough   ?               [name]     Neurogenic/Airway score    REC Pantoprazole (protonix) 40 mg   Take  30-60 min before first meal of the day and Pepcid (famotidine)  20 mg one after supper  until return to office  For cough > tessalon 200 mg every 6-8 hours to eliminate all the coughing  gerd diet  Depomedrol 120 mg IM today        07/23/2019  f/u ov/[name] re: cough since first of 2021  Chief Complaint  Patient presents with  . Follow-up    Cough has resolved. He is taking the tessalon about 2 x per day. He has not had to use his albuterol.   Dyspnea:  Not limited by breathing from desired activities  No real aerobics Cough: gone but still using tessalon bid Sleeping:  slt elevations pillows  SABA use: none  02: none    No obvious day to day or daytime variability or assoc excess/ purulent sputum or mucus plugs or hemoptysis or cp or chest tightness, subjective wheeze or overt sinus or hb symptoms.   Sleeping  without nocturnal  or early am exacerbation  of respiratory  c/o's or need for noct saba. Also denies any obvious fluctuation of symptoms with weather or environmental changes or other aggravating or alleviating factors except as outlined above   No unusual exposure hx or h/o childhood pna/ asthma or knowledge of premature birth.  Current Allergies, Complete Past Medical History, Past Surgical History, Family History, and Social History were reviewed in [employer] record.  ROS  The following are not active complaints unless bolded Hoarseness, sore throat, dysphagia, dental problems, itching, sneezing,  nasal congestion or discharge of excess mucus or purulent secretions, ear ache,   fever, chills, sweats, unintended wt loss or wt gain, classically pleuritic or exertional cp,  orthopnea pnd or arm/hand swelling  or leg swelling, presyncope, palpitations, abdominal pain, anorexia, nausea, vomiting, diarrhea  or change in bowel habits or change in bladder habits, change in stools or change in  urine, dysuria, hematuria,  rash, arthralgias, visual complaints, headache, numbness, weakness or ataxia or problems with walking or coordination,  change in mood or  memory.        Current Meds  Medication Sig  . albuterol (VENTOLIN HFA) 108 (90 Base) MCG/ACT inhaler Inhale 2 puffs into the lungs every 4 (four) hours as needed for wheezing or shortness of breath.  . allopurinol (ZYLOPRIM) 100 MG tablet Take 200 mg by mouth daily.   . Alprostadil, Vasodilator, (CAVERJECT IC) 1 Dose by Intracavernosal route daily as needed (erectile dysfunction).  Marland Kitchen aspirin EC 81 MG tablet Take 81 mg by mouth every morning.   . benzonatate (TESSALON) 200 MG capsule Take 1 capsule (200 mg total) by mouth 3 (three) times daily as needed for cough.  . diclofenac Sodium (VOLTAREN) 1 % GEL Apply 1 application topically daily as needed for pain.  . famotidine (PEPCID) 20 MG tablet One after supper  . ferric citrate (AURYXIA) 1 GM 210 MG(Fe) tablet Take 420 mg by mouth 3 (three) times daily with meals.  . insulin degludec (TRESIBA FLEXTOUCH) 200 UNIT/ML FlexTouch Pen Inject 20 Units into the skin daily.  Marland Kitchen levothyroxine (SYNTHROID, LEVOTHROID) 112 MCG tablet Take 112 mcg by mouth daily before breakfast. BRAND NAME SYNTHROID  . Methoxy PEG-Epoetin Beta (MIRCERA IJ) Mircera  . multivitamin (RENA-VIT) TABS tablet Take by mouth.  . pantoprazole (PROTONIX) 40 MG tablet Take 1 tablet (40 mg total) by mouth daily. Take 30-60 min before first meal of the day  . pregabalin (LYRICA) 150 MG capsule Take 1 capsule by mouth in the morning and at bedtime.  . rosuvastatin (CRESTOR) 10 MG tablet Take 10 mg by mouth daily.   . Semaglutide, 1 MG/DOSE, (OZEMPIC, 1 MG/DOSE,) 2 MG/1.5ML SOPN Inject 0.5 mg into the skin every Friday.   Marland Kitchen VITAMIN D, ERGOCALCIFEROL, PO Take by mouth.          Past Medical History:  Diagnosis Date  . Ambulates with cane    straight cane  . Cervical myelopathy ([place]) 02/06/2018  . Chronic kidney disease     dailysis [facility]  . Coronary artery disease   . Diabetes mellitus without complication ([place])    type2  . Diabetic foot ulcer ([place])   .  Diabetic foot ulcer ([place])   . Diabetic neuropathy ([place]) 02/06/2018  . Gait abnormality 08/24/2018  . GERD (gastroesophageal reflux disease)   . History of kidney stones    passed stones  . Hypercholesteremia   . Hypertension   . Hypothyroidism   . Neuromuscular disorder (HCC)    neuropathy left leg and bilateral feet  . Neuropathy   . Prostate cancer ([place])        Objective:    Somber wm nad    Wt Readings from Last 3 Encounters:  07/23/19 203 lb 12.8 oz (92.4 kg)  07/20/19 209 lb 3.2 oz (94.9 kg)  06/28/19 200 lb (90.7 kg)     Vital signs reviewed - Note on arrival 02 sats  97% on RA       HEENT : pt wearing mask not removed for exam due to covid -19 concerns.    NECK :  without JVD/Nodes/TM/ nl carotid upstrokes bilaterally   LUNGS: no acc muscle use,  Nl contour chest which is clear to A and P bilaterally without cough on insp or exp maneuvers   CV:  RRR  no s3 or murmur or increase in P2, and no edema   ABD:  soft and nontender with nl inspiratory excursion in the supine position. No bruits or organomegaly appreciated, bowel sounds nl  MS:  Nl gait/ ext warm without deformities, calf tenderness, cyanosis or clubbing No obvious joint restrictions   SKIN: warm and dry without lesions    NEURO:  alert, approp, nl sensorium  -   wears L ankle brace for foot drop        I personally reviewed images and agree with radiology impression as follows:  CXR:   06/25/19  No acute cardiopulmonary abnormality seen.    Assessment

## 2019-07-23 NOTE — Patient Instructions (Signed)
Stop the night time tessalon and no other changes > if cough recurs resume three times a day until 100% control x 3 straight days then twice daily    Please schedule a follow up office visit in 6 weeks, call sooner if needed

## 2019-07-24 ENCOUNTER — Encounter: Payer: Self-pay | Admitting: Internal Medicine

## 2019-07-24 NOTE — Assessment & Plan Note (Signed)
Onset around 1st of 2021  - Allergy profile 06/25/2019 >  Eos 0.2 /  IgE 10  RAST neg  - max gerd rx 06/25/2019 > much better 07/23/2019 rec taper tessalon down/ off  C/w Upper airway cough syndrome (previously labeled PNDS),  is so named because it's frequently impossible to sort out how much is  CR/sinusitis with freq throat clearing (which can be related to primary GERD)   vs  causing  secondary (" extra esophageal")  GERD from wide swings in gastric pressure that occur with throat clearing, often  promoting self use of mint and menthol lozenges that reduce the lower esophageal sphincter tone and exacerbate the problem further in a cyclical fashion.   These are the same pts (now being labeled as having "irritable larynx syndrome" by some cough centers) who not infrequently have a history of having failed to tolerate ace inhibitors,  dry powder inhalers or biphosphonates or report having atypical/extraesophageal reflux symptoms that don't respond to standard doses of PPI  and are easily confused as having aecopd or asthma flares by even experienced allergists/ pulmonologists (myself included).   If cough really has responded to gerd rx should be able to taper off tessalon > f/u in 6 weeks          Each maintenance medication was reviewed in detail including emphasizing most importantly the difference between maintenance and prns and under what circumstances the prns are to be triggered using an action plan format where appropriate.  Total time for H and P, chart review, counseling, teaching device and generating customized AVS unique to this office visit / charting = 20 min

## 2019-08-03 ENCOUNTER — Encounter: Payer: Medicare Other | Admitting: Internal Medicine

## 2019-08-08 ENCOUNTER — Other Ambulatory Visit: Payer: Self-pay | Admitting: Neurology

## 2019-08-19 ENCOUNTER — Encounter: Payer: Self-pay | Admitting: Orthopedic Surgery

## 2019-08-19 ENCOUNTER — Other Ambulatory Visit: Payer: Self-pay

## 2019-08-19 ENCOUNTER — Ambulatory Visit (INDEPENDENT_AMBULATORY_CARE_PROVIDER_SITE_OTHER): Payer: Medicare Other | Admitting: Orthopedic Surgery

## 2019-08-19 VITALS — Ht 70.0 in | Wt 203.0 lb

## 2019-08-19 DIAGNOSIS — M86271 Subacute osteomyelitis, right ankle and foot: Secondary | ICD-10-CM | POA: Diagnosis not present

## 2019-08-19 DIAGNOSIS — I6523 Occlusion and stenosis of bilateral carotid arteries: Secondary | ICD-10-CM | POA: Diagnosis not present

## 2019-08-19 MED ORDER — DOXYCYCLINE HYCLATE 100 MG PO TABS: 100.0000 mg | ORAL_TABLET | Freq: Every day | ORAL | 0 refills | Status: DC

## 2019-08-20 ENCOUNTER — Telehealth: Payer: Self-pay | Admitting: Orthopedic Surgery

## 2019-08-20 NOTE — Telephone Encounter (Signed)
I spoke with the patient he is having shaking chills and just does not feel well.  I also spoke with Dr. [name].  He has recommended that the patient go to the emergency room at [facility].  The patient is hesitant as he has several things going on but I did reiterate to him that infection of this type could be quite serious and that he needs to be seen and evaluated in an emergency room

## 2019-08-20 NOTE — Telephone Encounter (Signed)
Patient called advised he now have the shakes and needing to know if the surgery should be pushed up? Patient said he is not feeling very well. The number to contact patient is [phone]

## 2019-08-23 ENCOUNTER — Other Ambulatory Visit: Payer: Self-pay

## 2019-08-23 ENCOUNTER — Encounter: Payer: Self-pay | Admitting: Orthopedic Surgery

## 2019-08-23 NOTE — Progress Notes (Signed)
Office Visit Note   Patient: [name]           Date of Birth: [birth year]           MRN: [id number] Visit Date: 08/19/2019              Requested by: Lorenda Hatchet, FNP [address] [place],  [place] [address] PCP: Lorenda Hatchet, FNP  Chief Complaint  Patient presents with  . Right Foot - Open Wound    12/15/2018 right foor 4th and 5th ray amputation       HPI: Patient is a 69 year old gentleman who presents with worsening ulceration over the base of the fifth metatarsal right foot.  Patient is status post fourth and fifth ray amputation in 2020.  He currently ambulates with a quad cane.  Patient states that he is undergoing evaluation for kidney transplant and states he has follow-up appointments at [facility].  Assessment & Plan: Visit Diagnoses:  1. Subacute osteomyelitis, right ankle and foot ([place])     Plan: With the osteomyelitis of the base of the fifth metatarsal right foot have recommended proceeding with surgical intervention for partial bone excision from the base of the fifth metatarsal as well as the cuboid.  Discussed risks of maintaining a plantigrade foot that he would need braces he would need to be strictly nonweightbearing after surgery and patient may require a transtibial amputation.  Patient wishes to proceed with foot salvage intervention.  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  Follow-Up Instructions: Return in about 2 weeks (around 09/02/2019).   Ortho Exam  Patient is alert, oriented, no adenopathy, well-dressed, normal affect, normal respiratory effort. Examination patient has a worsening ulcer over the base of the fifth metatarsal right foot.  Patient has good pulses bilaterally.  There are some ischemic changes to the toes but no ulcers in the left foot.  Examination the ulcer which was previously superficial now extends down to bone.  Patient complains of  persistent drainage.  Imaging: No results found. No images are attached to the encounter.  Labs: Lab Results  Component Value Date   HGBA1C 6.3 (H) 12/14/2018   HGBA1C 6.0 (H) 12/24/2015   HGBA1C 7.0 (H) 12/25/2013   ESRSEDRATE 131 (H) 12/14/2018   ESRSEDRATE 16 02/06/2018   ESRSEDRATE 53 (H) 09/15/2012   CRP 32.1 (H) 12/14/2018   REPTSTATUS 12/18/2018 FINAL 12/15/2018   GRAMSTAIN  12/15/2018    FEW WBC PRESENT,BOTH PMN AND MONONUCLEAR FEW GRAM POSITIVE COCCI FEW GRAM NEGATIVE RODS RARE GRAM POSITIVE RODS    CULT  12/15/2018    FEW ESCHERICHIA COLI FEW CORYNEBACTERIUM STRIATUM FEW PREVOTELLA MELANINOGENICA BETA LACTAMASE POSITIVE Performed at [facility], [place] [address] [place], [place] [address]    LABORGA ESCHERICHIA COLI 12/15/2018     Lab Results  Component Value Date   ALBUMIN 3.0 (L) 12/14/2018   ALBUMIN 3.7 12/24/2015   ALBUMIN 3.7 12/28/2014   PREALBUMIN 14.8 (L) 12/14/2018    No results found for: MG No results found for: VD25OH  Lab Results  Component Value Date   PREALBUMIN 14.8 (L) 12/14/2018   CBC EXTENDED Latest Ref Rng & Units 06/25/2019 05/20/2019 03/25/2019  WBC 3.8 - 10.8 Thousand/uL 6.9 - -  RBC 4.20 - 5.80 Million/uL 3.57(L) - -  HGB 13.2 - 17.1 g/dL 10.3(L) 9.5(L) 9.2(L)  HCT 38.5 - 50.0 % 30.6(L) 28.0(L) 27.0(L)  PLT 140 -  400 Thousand/uL 220 - -  NEUTROABS 1,500 - 7,800 cells/uL 5,223 - -  LYMPHSABS 850 - 3,900 cells/uL 780(L) - -     Body mass index is 29.13 kg/m.  Orders:  No orders of the defined types were placed in this encounter.  Meds ordered this encounter  Medications  . doxycycline (VIBRA-TABS) 100 MG tablet    Sig: Take 1 tablet (100 mg total) by mouth daily.    Dispense:  30 tablet    Refill:  0     Procedures: No procedures performed  Clinical Data: No additional findings.  ROS:  All other systems negative, except as noted in the HPI. Review of Systems  Objective: Vital Signs: Ht 5\' 10"   (1.778 m)   Wt 203 lb (92.1 kg)   BMI 29.13 kg/m   Specialty Comments:  No specialty comments available.  PMFS History: Patient Active Problem List   Diagnosis Date Noted  . Chronic cough 06/25/2019  . Cutaneous abscess of right foot   . Osteomyelitis ([place]) 12/14/2018  . AKI (acute kidney injury) ([place]) 12/14/2018  . CKD (chronic kidney disease) stage 4, GFR 15-29 ml/min (H[facility]) 12/14/2018  . Gait abnormality 08/24/2018  . Diabetic neuropathy ([place]) 02/06/2018  . Cervical myelopathy ([place]) 02/06/2018  . Onychomycosis 10/30/2017  . Other spondylosis with radiculopathy, cervical region 01/27/2017  . Midfoot ulcer, left, limited to breakdown of skin ([place]) 11/15/2016  . Lateral epicondylitis, left elbow 08/12/2016  . Cellulitis of fifth toe of right foot 07/29/2016  . Prostate cancer ([place]) 06/19/2016  . Non-pressure chronic ulcer of other part of right foot limited to breakdown of skin ([place]) 04/04/2016  . Cellulitis of leg, right 12/24/2015  . Cellulitis of right lower extremity 12/24/2015  . CKD (chronic kidney disease), stage III 12/25/2013  . Foot infection 12/24/2013  . Foot ulcer, left ([place]) 12/24/2013  . Diabetic foot ulcer ([place]) 09/14/2012  . Gout 09/14/2012  . HTN (hypertension) 09/14/2012  . Hypothyroidism 09/14/2012  . CAD (coronary artery disease) 09/14/2012  . Diabetes mellitus ([place]) 09/14/2012  . Hypercholesteremia   . Neuropathy [facility])    Past Medical History:  Diagnosis Date  . Ambulates with cane    straight cane  . Cervical myelopathy ([place]) 02/06/2018  . Chronic kidney disease    dailysis [facility]  . Coronary artery disease   . Diabetes mellitus without complication ([place])    type2  . Diabetic foot ulcer ([place])   . Diabetic foot ulcer ([place])   . Diabetic neuropathy ([place]) 02/06/2018  . Gait abnormality 08/24/2018  . GERD (gastroesophageal reflux disease)   . History of kidney stones    passed stones  . Hypercholesteremia   . Hypertension   . Hypothyroidism   .  Neuromuscular disorder (HCC)    neuropathy left leg and bilateral feet  . Neuropathy   . Prostate cancer [facility])     Family History  Problem Relation Age of Onset  . Diabetes Mellitus II Mother   . Diabetes Mellitus II Father   . CAD Father   . Cancer Father        prostate  . Diabetes Mellitus II Brother   . Diabetes Mellitus II Brother   . Stomach cancer Brother 29  . Colon cancer Neg Hx   . Colon polyps Neg Hx   . Esophageal cancer Neg Hx   . Rectal cancer Neg Hx     Past Surgical History:  Procedure Laterality Date  . AMPUTATION Left 12/25/2013  Procedure: AMPUTATION RAY LEFT 5TH RAY;  Surgeon: [name], MD;  Location: [facility];  Service: Orthopedics;  Laterality: Left;  . AMPUTATION Right 12/15/2018   Procedure: AMPUTATION OF 4TH AND 5TH TOES RIGHT FOOT;  Surgeon: [name], MD;  Location: [facility];  Service: Orthopedics;  Laterality: Right;  . APPLICATION OF WOUND VAC Right 12/15/2018   Procedure: APPLICATION OF WOUND VAC;  Surgeon: [name], MD;  Location: [facility];  Service: Orthopedics;  Laterality: Right;  . AV FISTULA PLACEMENT Left 03/25/2019   Procedure: LEFT ARM ARTERIOVENOUS (AV) FISTULA CREATION;  Surgeon: [name], MD;  Location: [facility];  Service: Vascular;  Laterality: Left;  . BACK SURGERY    . BASCILIC VEIN TRANSPOSITION Left 05/20/2019   Procedure: SECOND STAGE LEFT BASCILIC VEIN TRANSPOSITION;  Surgeon: [name], MD;  Location: [facility];  Service: Vascular;  Laterality: Left;  . CARDIAC CATHETERIZATION  02/17/2014  . CHOLECYSTECTOMY    . COLONOSCOPY  2011   in [place], [place] GRAFT  2008  . I & D EXTREMITY Right 12/15/2018   Procedure: DEBRIDEMENT RIGHT FOOT;  Surgeon: [name], MD;  Location: [facility];  Service: Orthopedics;  Laterality: Right;  . NECK SURGERY     novemver 2019  . WISDOM TOOTH EXTRACTION     Social History   Occupational History  . Not on file  Tobacco Use  . Smoking status: Former  Smoker    Types: Cigars    Quit date: 12/24/1988    Years since quitting: 30.6  . Smokeless tobacco: Never Used  Substance and Sexual Activity  . Alcohol use: No  . Drug use: No  . Sexual activity: Yes    Partners: Female

## 2019-08-25 ENCOUNTER — Other Ambulatory Visit: Payer: Self-pay | Admitting: Physician Assistant

## 2019-08-26 ENCOUNTER — Other Ambulatory Visit (HOSPITAL_COMMUNITY)
Admission: RE | Admit: 2019-08-26 | Discharge: 2019-08-26 | Disposition: A | Discharge status: Home / Self Care | Payer: Medicare Other | Source: Ambulatory Visit | Attending: Orthopedic Surgery | Admitting: Orthopedic Surgery

## 2019-08-26 ENCOUNTER — Encounter (HOSPITAL_COMMUNITY): Payer: Self-pay | Admitting: Orthopedic Surgery

## 2019-08-26 ENCOUNTER — Other Ambulatory Visit: Payer: Self-pay

## 2019-08-26 DIAGNOSIS — Z01812 Encounter for preprocedural laboratory examination: Secondary | ICD-10-CM | POA: Diagnosis present

## 2019-08-26 DIAGNOSIS — Z20822 Contact with and (suspected) exposure to covid-19: Secondary | ICD-10-CM | POA: Diagnosis not present

## 2019-08-26 LAB — SARS CORONAVIRUS 2 (TAT 6-24 HRS): SARS Coronavirus 2: NEGATIVE

## 2019-08-26 NOTE — Progress Notes (Addendum)
[name] denies chest pain or shortness of breath. Patient was  tested  for Covid__5/20/21 and has been in quarantine since that time.  Mr [name] is on Hemodialysis and begins home dialysis tonight.  [name] has type II diabetes, patient reports that last A1C drawn at the kidney center was 7.0.  I instructed patient to take 1/2 of Tresbia tonight, =10 units.I instructed patient to check CBG after awaking and every 2 hours until arrival  to the hospital.  I Instructed patient if CBG is less than 70 to drink1 /2 cup of a clear juice. Recheck CBG in 15 minutes then call pre- op desk at [phone] for further instructions.

## 2019-08-27 ENCOUNTER — Encounter (HOSPITAL_COMMUNITY): Payer: Self-pay | Admitting: Orthopedic Surgery

## 2019-08-27 ENCOUNTER — Other Ambulatory Visit: Payer: Self-pay

## 2019-08-27 ENCOUNTER — Encounter (HOSPITAL_COMMUNITY)
Admission: RE | Disposition: A | Discharge status: Home / Self Care | Payer: Self-pay | Source: Ambulatory Visit | Attending: Orthopedic Surgery

## 2019-08-27 ENCOUNTER — Ambulatory Visit (HOSPITAL_COMMUNITY)
Admission: RE | Admit: 2019-08-27 | Discharge: 2019-08-27 | Disposition: A | Discharge status: Home / Self Care | Payer: Medicare Other | Source: Ambulatory Visit | Attending: Orthopedic Surgery | Admitting: Orthopedic Surgery

## 2019-08-27 ENCOUNTER — Ambulatory Visit (HOSPITAL_COMMUNITY): Payer: Medicare Other | Admitting: Anesthesiology

## 2019-08-27 DIAGNOSIS — I120 Hypertensive chronic kidney disease with stage 5 chronic kidney disease or end stage renal disease: Secondary | ICD-10-CM | POA: Insufficient documentation

## 2019-08-27 DIAGNOSIS — Z951 Presence of aortocoronary bypass graft: Secondary | ICD-10-CM | POA: Insufficient documentation

## 2019-08-27 DIAGNOSIS — Z7982 Long term (current) use of aspirin: Secondary | ICD-10-CM | POA: Diagnosis not present

## 2019-08-27 DIAGNOSIS — N186 End stage renal disease: Secondary | ICD-10-CM | POA: Insufficient documentation

## 2019-08-27 DIAGNOSIS — Z833 Family history of diabetes mellitus: Secondary | ICD-10-CM | POA: Diagnosis not present

## 2019-08-27 DIAGNOSIS — M1990 Unspecified osteoarthritis, unspecified site: Secondary | ICD-10-CM | POA: Diagnosis not present

## 2019-08-27 DIAGNOSIS — L97514 Non-pressure chronic ulcer of other part of right foot with necrosis of bone: Secondary | ICD-10-CM | POA: Diagnosis not present

## 2019-08-27 DIAGNOSIS — E1169 Type 2 diabetes mellitus with other specified complication: Secondary | ICD-10-CM | POA: Insufficient documentation

## 2019-08-27 DIAGNOSIS — K219 Gastro-esophageal reflux disease without esophagitis: Secondary | ICD-10-CM | POA: Insufficient documentation

## 2019-08-27 DIAGNOSIS — M109 Gout, unspecified: Secondary | ICD-10-CM | POA: Insufficient documentation

## 2019-08-27 DIAGNOSIS — Z794 Long term (current) use of insulin: Secondary | ICD-10-CM | POA: Diagnosis not present

## 2019-08-27 DIAGNOSIS — E1140 Type 2 diabetes mellitus with diabetic neuropathy, unspecified: Secondary | ICD-10-CM | POA: Insufficient documentation

## 2019-08-27 DIAGNOSIS — I2510 Atherosclerotic heart disease of native coronary artery without angina pectoris: Secondary | ICD-10-CM | POA: Diagnosis not present

## 2019-08-27 DIAGNOSIS — Z87891 Personal history of nicotine dependence: Secondary | ICD-10-CM | POA: Insufficient documentation

## 2019-08-27 DIAGNOSIS — M86271 Subacute osteomyelitis, right ankle and foot: Secondary | ICD-10-CM

## 2019-08-27 DIAGNOSIS — E7800 Pure hypercholesterolemia, unspecified: Secondary | ICD-10-CM | POA: Diagnosis not present

## 2019-08-27 DIAGNOSIS — E11621 Type 2 diabetes mellitus with foot ulcer: Secondary | ICD-10-CM | POA: Insufficient documentation

## 2019-08-27 DIAGNOSIS — M869 Osteomyelitis, unspecified: Secondary | ICD-10-CM | POA: Diagnosis present

## 2019-08-27 DIAGNOSIS — Z79890 Hormone replacement therapy (postmenopausal): Secondary | ICD-10-CM | POA: Insufficient documentation

## 2019-08-27 DIAGNOSIS — E1122 Type 2 diabetes mellitus with diabetic chronic kidney disease: Secondary | ICD-10-CM | POA: Diagnosis not present

## 2019-08-27 DIAGNOSIS — Z8546 Personal history of malignant neoplasm of prostate: Secondary | ICD-10-CM | POA: Insufficient documentation

## 2019-08-27 DIAGNOSIS — E039 Hypothyroidism, unspecified: Secondary | ICD-10-CM | POA: Insufficient documentation

## 2019-08-27 DIAGNOSIS — Z79899 Other long term (current) drug therapy: Secondary | ICD-10-CM | POA: Insufficient documentation

## 2019-08-27 DIAGNOSIS — L97511 Non-pressure chronic ulcer of other part of right foot limited to breakdown of skin: Secondary | ICD-10-CM | POA: Diagnosis not present

## 2019-08-27 HISTORY — DX: Gout, unspecified: M10.9

## 2019-08-27 HISTORY — PX: I & D EXTREMITY: SHX5045

## 2019-08-27 LAB — SURGICAL PCR SCREEN
MRSA, PCR: NEGATIVE
Staphylococcus aureus: NEGATIVE

## 2019-08-27 LAB — GLUCOSE, CAPILLARY
Glucose-Capillary: 148 mg/dL — ABNORMAL HIGH (ref 70–99)
Glucose-Capillary: 106 mg/dL — ABNORMAL HIGH (ref 70–99)
Glucose-Capillary: 134 mg/dL — ABNORMAL HIGH (ref 70–99)

## 2019-08-27 LAB — POCT I-STAT, CHEM 8
BUN: 44 mg/dL — ABNORMAL HIGH (ref 8–23)
Calcium, Ion: 1.01 mmol/L — ABNORMAL LOW (ref 1.15–1.40)
Chloride: 96 mmol/L — ABNORMAL LOW (ref 98–111)
Creatinine, Ser: 8.8 mg/dL — ABNORMAL HIGH (ref 0.61–1.24)
Glucose, Bld: 144 mg/dL — ABNORMAL HIGH (ref 70–99)
HCT: 23 % — ABNORMAL LOW (ref 39.0–52.0)
Hemoglobin: 7.8 g/dL — ABNORMAL LOW (ref 13.0–17.0)
Potassium: 3.7 mmol/L (ref 3.5–5.1)
Sodium: 136 mmol/L (ref 135–145)
TCO2: 31 mmol/L (ref 22–32)

## 2019-08-27 SURGERY — IRRIGATION AND DEBRIDEMENT EXTREMITY
Anesthesia: Monitor Anesthesia Care | Laterality: Right

## 2019-08-27 MED ORDER — CEFAZOLIN SODIUM-DEXTROSE 2-4 GM/100ML-% IV SOLN
2.0000 g | INTRAVENOUS | Status: AC
  Administered 2019-08-27: 2 g via INTRAVENOUS
  Filled 2019-08-27: qty 100

## 2019-08-27 MED ORDER — MIDAZOLAM HCL 2 MG/2ML IJ SOLN: 1.0000 mg | Freq: Once | INTRAMUSCULAR | Status: AC

## 2019-08-27 MED ORDER — FENTANYL CITRATE (PF) 250 MCG/5ML IJ SOLN
INTRAMUSCULAR | Status: AC
  Filled 2019-08-27: qty 5

## 2019-08-27 MED ORDER — MIDAZOLAM HCL 2 MG/2ML IJ SOLN
INTRAMUSCULAR | Status: AC
  Filled 2019-08-27: qty 2

## 2019-08-27 MED ORDER — SODIUM CHLORIDE 0.9 % IV SOLN: INTRAVENOUS | Status: DC

## 2019-08-27 MED ORDER — PROPOFOL 10 MG/ML IV BOLUS
INTRAVENOUS | Status: DC | PRN
  Administered 2019-08-27 (×3): 20 mg via INTRAVENOUS

## 2019-08-27 MED ORDER — ROPIVACAINE HCL 5 MG/ML IJ SOLN
INTRAMUSCULAR | Status: DC | PRN
  Administered 2019-08-27: 25 mL via PERINEURAL
  Administered 2019-08-27: 15 mL via PERINEURAL

## 2019-08-27 MED ORDER — FENTANYL CITRATE (PF) 100 MCG/2ML IJ SOLN: 50.0000 ug | Freq: Once | INTRAMUSCULAR | Status: AC

## 2019-08-27 MED ORDER — MIDAZOLAM HCL 5 MG/5ML IJ SOLN
INTRAMUSCULAR | Status: DC | PRN
  Administered 2019-08-27: 1 mg via INTRAVENOUS

## 2019-08-27 MED ORDER — OXYCODONE-ACETAMINOPHEN 5-325 MG PO TABS
1.0000 | ORAL_TABLET | ORAL | 0 refills | Status: DC | PRN
Start: 2019-08-27 — End: 2020-01-05

## 2019-08-27 MED ORDER — MIDAZOLAM HCL 2 MG/2ML IJ SOLN
INTRAMUSCULAR | Status: AC
  Administered 2019-08-27: 1 mg via INTRAVENOUS
  Filled 2019-08-27: qty 2

## 2019-08-27 MED ORDER — FENTANYL CITRATE (PF) 100 MCG/2ML IJ SOLN
INTRAMUSCULAR | Status: AC
  Administered 2019-08-27: 50 ug via INTRAVENOUS
  Filled 2019-08-27: qty 2

## 2019-08-27 MED ORDER — CHLORHEXIDINE GLUCONATE 0.12 % MT SOLN
OROMUCOSAL | Status: AC
  Administered 2019-08-27: 15 mL via OROMUCOSAL
  Filled 2019-08-27: qty 15

## 2019-08-27 MED ORDER — PROPOFOL 500 MG/50ML IV EMUL
INTRAVENOUS | Status: DC | PRN
  Administered 2019-08-27: 150 ug/kg/min via INTRAVENOUS

## 2019-08-27 MED ORDER — CHLORHEXIDINE GLUCONATE 0.12 % MT SOLN: 15.0000 mL | Freq: Once | OROMUCOSAL | Status: AC

## 2019-08-27 MED ORDER — ORAL CARE MOUTH RINSE: 15.0000 mL | Freq: Once | OROMUCOSAL | Status: AC

## 2019-08-27 SURGICAL SUPPLY — 35 items
BLADE SURG 21 STRL SS (BLADE) ×2 IMPLANT
BNDG COHESIVE 4X5 TAN STRL (GAUZE/BANDAGES/DRESSINGS) ×2 IMPLANT
BNDG COHESIVE 6X5 TAN STRL LF (GAUZE/BANDAGES/DRESSINGS) IMPLANT
BNDG GAUZE ELAST 4 BULKY (GAUZE/BANDAGES/DRESSINGS) ×2 IMPLANT
COVER SURGICAL LIGHT HANDLE (MISCELLANEOUS) ×4 IMPLANT
COVER WAND RF STERILE (DRAPES) ×2 IMPLANT
DRAPE U-SHAPE 47X51 STRL (DRAPES) ×2 IMPLANT
DRSG ADAPTIC 3X8 NADH LF (GAUZE/BANDAGES/DRESSINGS) ×2 IMPLANT
DRSG EMULSION OIL 3X3 NADH (GAUZE/BANDAGES/DRESSINGS) ×2 IMPLANT
DURAPREP 26ML APPLICATOR (WOUND CARE) ×2 IMPLANT
ELECT REM PT RETURN 9FT ADLT (ELECTROSURGICAL)
ELECTRODE REM PT RTRN 9FT ADLT (ELECTROSURGICAL) IMPLANT
GAUZE SPONGE 4X4 12PLY STRL (GAUZE/BANDAGES/DRESSINGS) ×2 IMPLANT
GLOVE BIOGEL PI IND STRL 9 (GLOVE) ×1 IMPLANT
GLOVE BIOGEL PI INDICATOR 9 (GLOVE) ×1
GLOVE SURG ORTHO 9.0 STRL STRW (GLOVE) ×2 IMPLANT
GOWN STRL REUS W/ TWL XL LVL3 (GOWN DISPOSABLE) ×2 IMPLANT
GOWN STRL REUS W/TWL XL LVL3 (GOWN DISPOSABLE) ×2
HANDPIECE INTERPULSE COAX TIP (DISPOSABLE)
KIT BASIN OR (CUSTOM PROCEDURE TRAY) ×2 IMPLANT
KIT TURNOVER KIT B (KITS) ×2 IMPLANT
MANIFOLD NEPTUNE II (INSTRUMENTS) ×2 IMPLANT
NS IRRIG 1000ML POUR BTL (IV SOLUTION) ×2 IMPLANT
PACK ORTHO EXTREMITY (CUSTOM PROCEDURE TRAY) ×2 IMPLANT
PAD ABD 8X10 STRL (GAUZE/BANDAGES/DRESSINGS) ×2 IMPLANT
PAD ARMBOARD 7.5X6 YLW CONV (MISCELLANEOUS) ×4 IMPLANT
PREVENA RESTOR ARTHOFORM 46X30 (CANNISTER) ×2 IMPLANT
SET HNDPC FAN SPRY TIP SCT (DISPOSABLE) IMPLANT
STOCKINETTE IMPERVIOUS 9X36 MD (GAUZE/BANDAGES/DRESSINGS) IMPLANT
SUT ETHILON 2 0 PSLX (SUTURE) ×4 IMPLANT
SWAB COLLECTION DEVICE MRSA (MISCELLANEOUS) ×2 IMPLANT
SWAB CULTURE ESWAB REG 1ML (MISCELLANEOUS) IMPLANT
TOWEL GREEN STERILE (TOWEL DISPOSABLE) ×2 IMPLANT
TUBE CONNECTING 12X1/4 (SUCTIONS) ×2 IMPLANT
YANKAUER SUCT BULB TIP NO VENT (SUCTIONS) ×2 IMPLANT

## 2019-08-27 NOTE — Op Note (Addendum)
08/27/2019  9:48 AM  PATIENT:  [name]    PRE-OPERATIVE DIAGNOSIS:  Osteomyelitis Right Cuboid  POST-OPERATIVE DIAGNOSIS:  Same  PROCEDURE:  PARTIAL CUBOID EXCISION RIGHT FOOT Local tissue rearrangement for wound closure 9 x 5 cm. Application of 13 cm Prevena wound VAC.  SURGEON:  [name], MD  PHYSICIAN ASSISTANT:None ANESTHESIA:   General  PREOPERATIVE INDICATIONS:  [name] is a  69 y.o. male with a diagnosis of Osteomyelitis Right Cuboid who failed conservative measures and elected for surgical management.    The risks benefits and alternatives were discussed with the patient preoperatively including but not limited to the risks of infection, bleeding, nerve injury, cardiopulmonary complications, the need for revision surgery, among others, and the patient was willing to proceed.  OPERATIVE IMPLANTS: None  @ENCIMAGES @  OPERATIVE FINDINGS: Infection extended down to the base of the fifth metatarsal and the cuboid partial excision of both cuboid and base of the fifth metatarsal.  No surrounding abscess.  No clinical signs of infection at the bone resection margins.  OPERATIVE PROCEDURE: Patient was brought the operating room and underwent a regional anesthetic.  After adequate levels anesthesia were obtained patient's right lower extremity was prepped using DuraPrep draped into a sterile field a timeout was called.  Elliptical incision was made around the ulcerative tissue carried down to bone.  This left a wound that was 5 x 9 cm.  A oscillating saw was used to perform a partial cuboid excision.  The resection margins were clear.  The wound was irrigated with normal saline.  Local tissue rearrangement was used to perform a wound closure 5 x 9 cm.  A Prevena wound VAC was applied this had a good suction fit patient was taken the PACU in stable condition   DISCHARGE PLANNING:  Antibiotic duration: Preoperative antibiotics  Weightbearing: Nonweightbearing on the  right  Pain medication: Prescription for Percocet  Dressing care/ Wound VAC: Continue wound VAC for 1 week  Ambulatory devices: Walker crutches or kneeling scooter  Discharge to: Home.  Follow-up: In the office 1 week post operative.

## 2019-08-27 NOTE — Anesthesia Procedure Notes (Signed)
Anesthesia Regional Block: Adductor canal block   Pre-Anesthetic Checklist: ,, timeout performed, Correct Patient, Correct Site, Correct Laterality, Correct Procedure, Correct Position, site marked, Risks and benefits discussed,  Surgical consent,  Pre-op evaluation,  At surgeon's request and post-op pain management  Laterality: Right  Prep: chloraprep       Needles:  Injection technique: Single-shot  Needle Type: Echogenic Stimulator Needle     Needle Length: 10cm  Needle Gauge: 20     Additional Needles:   Procedures:,,,, ultrasound used (permanent image in chart),,,,  Narrative:  Start time: 08/27/2019 8:30 AM End time: 08/27/2019 8:33 AM Injection made incrementally with aspirations every 5 mL.  Performed by: Personally  Anesthesiologist: Lidia Collum, MD  Additional Notes: Monitors applied. Injection made in 5cc increments. No resistance to injection. Good needle visualization. Patient tolerated procedure well. 15 mL 0.5% ropivacaine

## 2019-08-27 NOTE — Anesthesia Procedure Notes (Signed)
Anesthesia Regional Block: Popliteal block   Pre-Anesthetic Checklist: ,, timeout performed, Correct Patient, Correct Site, Correct Laterality, Correct Procedure, Correct Position, site marked, Risks and benefits discussed,  Surgical consent,  Pre-op evaluation,  At surgeon's request and post-op pain management  Laterality: Right  Prep: chloraprep       Needles:  Injection technique: Single-shot  Needle Type: Echogenic Stimulator Needle     Needle Length: 10cm  Needle Gauge: 20     Additional Needles:   Procedures:,,,, ultrasound used (permanent image in chart),,,,  Narrative:  Start time: 08/27/2019 8:33 AM End time: 08/27/2019 8:36 AM Injection made incrementally with aspirations every 5 mL.  Performed by: Personally  Anesthesiologist: Lidia Collum, MD  Additional Notes: Monitors applied. Injection made in 5cc increments. No resistance to injection. Good needle visualization. Patient tolerated procedure well.    Ropivacaine 0.5% 25 mL

## 2019-08-27 NOTE — Anesthesia Preprocedure Evaluation (Addendum)
Anesthesia Evaluation  Patient identified by MRN, date of birth, ID band Patient awake    Reviewed: Allergy & Precautions, NPO status , Patient's Chart, lab work & pertinent test results  History of Anesthesia Complications Negative for: history of anesthetic complications  Airway Mallampati: II  TM Distance: >3 FB Neck ROM: Full    Dental  (+) Caps   Pulmonary neg pulmonary ROS, former smoker,    Pulmonary exam normal        Cardiovascular hypertension, + CAD and + CABG (2005)  Normal cardiovascular exam     Neuro/Psych negative neurological ROS  negative psych ROS   GI/Hepatic Neg liver ROS, GERD  ,  Endo/Other  diabetes, Type 2, Insulin DependentHypothyroidism   Renal/GU CRF, ESRF and DialysisRenal disease  negative genitourinary   Musculoskeletal  (+) Arthritis ,   Abdominal   Peds  Hematology  (+) anemia ,   Anesthesia Other Findings   Reproductive/Obstetrics                          Anesthesia Physical Anesthesia Plan  ASA: IV  Anesthesia Plan: MAC   Post-op Pain Management:    Induction: Intravenous  PONV Risk Score and Plan: 1 and Propofol infusion, TIVA and Treatment may vary due to age or medical condition  Airway Management Planned: Natural Airway, Nasal Cannula and Simple Face Mask  Additional Equipment: None  Intra-op Plan:   Post-operative Plan:   Informed Consent: I have reviewed the patients History and Physical, chart, labs and discussed the procedure including the risks, benefits and alternatives for the proposed anesthesia with the patient or authorized representative who has indicated his/her understanding and acceptance.       Plan Discussed with:   Anesthesia Plan Comments:       Anesthesia Quick Evaluation

## 2019-08-27 NOTE — Progress Notes (Signed)
Orthopedic Tech Progress Note Patient Details:  [name] Sep 23, 1950 [id number] PACU RN called requesting POST OP SHOE Ortho Devices Type of Ortho Device: Postop shoe/boot Ortho Device/Splint Location: RLE Ortho Device/Splint Interventions: Application   Post Interventions Patient Tolerated: Well Instructions Provided: Care of device   Janit Pagan 08/27/2019, 10:38 AM

## 2019-08-27 NOTE — Anesthesia Procedure Notes (Signed)
Procedure Name: MAC Date/Time: 08/27/2019 9:05 AM Performed by: Amadeo Garnet, CRNA Pre-anesthesia Checklist: Patient identified, Emergency Drugs available, Suction available and Patient being monitored Patient Re-evaluated:Patient Re-evaluated prior to induction Oxygen Delivery Method: Simple face mask Preoxygenation: Pre-oxygenation with 100% oxygen Induction Type: IV induction Placement Confirmation: positive ETCO2 Dental Injury: Teeth and Oropharynx as per pre-operative assessment

## 2019-08-27 NOTE — Transfer of Care (Signed)
Immediate Anesthesia Transfer of Care Note  Patient: [name]  Procedure(s) Performed: PARTIAL CUBOID EXCISION RIGHT FOOT (Right )  Patient Location: PACU  Anesthesia Type:MAC combined with regional for post-op pain  Level of Consciousness: drowsy  Airway & Oxygen Therapy: Patient Spontanous Breathing  Post-op Assessment: Report given to RN, Post -op Vital signs reviewed and stable and Patient moving all extremities  Post vital signs: Reviewed and stable  Last Vitals:  Vitals Value Taken Time  BP 123/58 08/27/19 0933  Temp    Pulse 73 08/27/19 0934  Resp 15 08/27/19 0934  SpO2 100 % 08/27/19 0934  Vitals shown include unvalidated device data.  Last Pain:  Vitals:   08/27/19 0840  PainSc: 0-No pain         Complications: No apparent anesthesia complications

## 2019-08-27 NOTE — H&P (Signed)
[name] is an 69 y.o. male.   Chief Complaint:  Right Foot osteomyelitis HPI: Patient is a 69 year old gentleman who presents with worsening ulceration over the base of the fifth metatarsal right foot.  Patient is status post fourth and fifth ray amputation in 2020.  He currently ambulates with a quad cane.  Patient states that he is undergoing evaluation for kidney transplant and states he has follow-up appointments at [facility].  Past Medical History:  Diagnosis Date  . Ambulates with cane    straight cane  . Cervical myelopathy ([place]) 02/06/2018  . Chronic kidney disease    dailysis M W F- home  . Coronary artery disease   . Diabetes mellitus without complication ([place])    type2  . Diabetic foot ulcer ([place])   . Diabetic foot ulcer ([place])   . Diabetic neuropathy ([place]) 02/06/2018  . Gait abnormality 08/24/2018  . GERD (gastroesophageal reflux disease)   . Gout   . History of kidney stones    passed stones  . Hypercholesteremia   . Hypertension   . Hypothyroidism   . Neuromuscular disorder (HCC)    neuropathy left leg and bilateral feet  . Neuropathy   . Prostate cancer [facility])     Past Surgical History:  Procedure Laterality Date  . AMPUTATION Left 12/25/2013   Procedure: AMPUTATION RAY LEFT 5TH RAY;  Surgeon: [name], MD;  [facility];  Service: Orthopedics;  Laterality: Left;  . AMPUTATION Right 12/15/2018   Procedure: AMPUTATION OF 4TH AND 5TH TOES RIGHT FOOT;  Surgeon: [name], MD;  Location: [place];  Service: Orthopedics;  Laterality: Right;  . APPLICATION OF WOUND VAC Right 12/15/2018   Procedure: APPLICATION OF WOUND VAC;  Surgeon: [name], MD;  Location: [place];  Service: Orthopedics;  Laterality: Right;  . AV FISTULA PLACEMENT Left 03/25/2019   Procedure: LEFT ARM ARTERIOVENOUS (AV) FISTULA CREATION;  Surgeon: [name], MD;  Location: [place];  Service: Vascular;  Laterality: Left;  . BACK SURGERY    . BASCILIC VEIN TRANSPOSITION  Left 05/20/2019   Procedure: SECOND STAGE LEFT BASCILIC VEIN TRANSPOSITION;  Surgeon: [name], MD;  Location: [place];  Service: Vascular;  Laterality: Left;  . CARDIAC CATHETERIZATION  02/17/2014  . CHOLECYSTECTOMY    . COLONOSCOPY  2011   in [place], [place] GRAFT  2008  . I & D EXTREMITY Right 12/15/2018   Procedure: DEBRIDEMENT RIGHT FOOT;  Surgeon: [name], MD;  Location: [place];  Service: Orthopedics;  Laterality: Right;  . NECK SURGERY     novemver 2019  . WISDOM TOOTH EXTRACTION      Family History  Problem Relation Age of Onset  . Diabetes Mellitus II Mother   . Diabetes Mellitus II Father   . CAD Father   . Cancer Father        prostate  . Diabetes Mellitus II Brother   . Diabetes Mellitus II Brother   . Stomach cancer Brother 82  . Colon cancer Neg Hx   . Colon polyps Neg Hx   . Esophageal cancer Neg Hx   . Rectal cancer Neg Hx    Social History:  reports that he quit smoking about 30 years ago. His smoking use included cigars. He has never used smokeless tobacco. He reports that he does not drink alcohol or use drugs.  Allergies:  Allergies  Allergen Reactions  . Mushroom Extract  Complex Nausea Only    Medications Prior to Admission  Medication Sig Dispense Refill  . allopurinol (ZYLOPRIM) 100 MG tablet Take 200 mg by mouth daily.     Marland Kitchen aspirin EC 81 MG tablet Take 81 mg by mouth every morning.     . benzonatate (TESSALON) 200 MG capsule Take 1 capsule (200 mg total) by mouth 3 (three) times daily as needed for cough. (Patient taking differently: Take 200 mg by mouth daily. ) 45 capsule 1  . calcitRIOL (ROCALTROL) 0.5 MCG capsule Take 0.5 mcg by mouth 3 (three) times a week.    . diclofenac Sodium (VOLTAREN) 1 % GEL Apply 1 application topically daily as needed (pain).     Marland Kitchen doxycycline (VIBRA-TABS) 100 MG tablet Take 1 tablet (100 mg total) by mouth daily. (Patient taking differently: Take 100 mg by mouth 2 (two) times  daily. ) 30 tablet 0  . famotidine (PEPCID) 20 MG tablet One after supper (Patient taking differently: Take 20 mg by mouth every evening. ) 30 tablet 11  . ferric citrate (AURYXIA) 1 GM 210 MG(Fe) tablet Take 420 mg by mouth 3 (three) times daily with meals.    . insulin degludec (TRESIBA FLEXTOUCH) 200 UNIT/ML FlexTouch Pen Inject 20 Units into the skin at bedtime.     Marland Kitchen levothyroxine (SYNTHROID, LEVOTHROID) 112 MCG tablet Take 112 mcg by mouth daily before breakfast. BRAND NAME SYNTHROID    . multivitamin (RENA-VIT) TABS tablet Take 1 tablet by mouth daily.     . mupirocin cream (BACTROBAN) 2 % Apply 1 application topically daily as needed.    . pantoprazole (PROTONIX) 40 MG tablet Take 1 tablet (40 mg total) by mouth daily. Take 30-60 min before first meal of the day 30 tablet 2  . pregabalin (LYRICA) 150 MG capsule Take 150 mg by mouth in the morning and at bedtime.     . rosuvastatin (CRESTOR) 10 MG tablet Take 10 mg by mouth daily.     . Semaglutide, 1 MG/DOSE, (OZEMPIC, 1 MG/DOSE,) 2 MG/1.5ML SOPN Inject 0.5 mg into the skin every Friday. Takes in the evening    . albuterol (VENTOLIN HFA) 108 (90 Base) MCG/ACT inhaler Inhale 2 puffs into the lungs every 4 (four) hours as needed for wheezing or shortness of breath. (Patient not taking: Reported on 08/23/2019) 18 g 0  . DULoxetine (CYMBALTA) 60 MG capsule TAKE 1 CAPSULE BY MOUTH EVERY DAY (Patient not taking: Reported on 08/23/2019) 90 capsule 1    Results for orders placed or performed during the hospital encounter of 08/26/19 (from the past 48 hour(s))  SARS CORONAVIRUS 2 (TAT 6-24 HRS) Nasopharyngeal Nasopharyngeal Swab     Status: None   Collection Time: 08/26/19  8:40 AM   Specimen: Nasopharyngeal Swab  Result Value Ref Range   SARS Coronavirus 2 NEGATIVE NEGATIVE    Comment: (NOTE) SARS-CoV-2 target nucleic acids are NOT DETECTED. The SARS-CoV-2 RNA is generally detectable in upper and lower respiratory specimens during the acute  phase of infection. Negative results do not preclude SARS-CoV-2 infection, do not rule out co-infections with other pathogens, and should not be used as the sole basis for treatment or other patient management decisions. Negative results must be combined with clinical observations, patient history, and epidemiological information. The expected result is Negative. Fact Sheet for Patients: SugarRoll.be Fact Sheet for Healthcare Providers: https://www.woods-mathews.com/ This test is not yet approved or cleared by the [place] FDA and  has been authorized for detection and/or diagnosis of  SARS-CoV-2 by FDA under an Emergency Use Authorization (EUA). This EUA will remain  in effect (meaning this test can be used) for the duration of the COVID-19 declaration under Section 56 4(b)(1) of the Act, 21 U.S.C. section 360bbb-3(b)(1), unless the authorization is terminated or revoked sooner. Performed at [facility], [place] [address], [place], [place] [address]    No results found.  Review of Systems  All other systems reviewed and are negative.   Blood pressure (!) 137/53, pulse 67, temperature 98.2 F (36.8 C), resp. rate 18, height 5\' 10"  (1.778 m), weight 91.6 kg, SpO2 100 %. Physical Exam  Patient is alert, oriented, no adenopathy, well-dressed, normal affect, normal respiratory effort. Examination patient has a worsening ulcer over the base of the fifth metatarsal right foot.  Patient has good pulses bilaterally.  There are some ischemic changes to the toes but no ulcers in the left foot.  Examination the ulcer which was previously superficial now extends down to bone.  Patient complains of persistent drainage. Lungs Clear. Heart RRR Assessment/Plan Visit Diagnoses:  1. Subacute osteomyelitis, right ankle and foot ([place])     Plan: With the osteomyelitis of the base of the fifth metatarsal right foot have recommended proceeding with  surgical intervention for partial bone excision from the base of the fifth metatarsal as well as the cuboid.  Discussed risks of maintaining a plantigrade foot that he would need braces he would need to be strictly nonweightbearing after surgery and patient may require a transtibial amputation.  Patient wishes to proceed with foot salvage intervention.  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   Bevely Palmer Kayler Rise, PA 08/27/2019, 6:40 AM

## 2019-08-27 NOTE — Anesthesia Postprocedure Evaluation (Signed)
Anesthesia Post Note  Patient: [name]  Procedure(s) Performed: PARTIAL CUBOID EXCISION RIGHT FOOT (Right )     Patient location during evaluation: PACU Anesthesia Type: MAC Level of consciousness: awake and alert Pain management: pain level controlled Vital Signs Assessment: post-procedure vital signs reviewed and stable Respiratory status: spontaneous breathing, nonlabored ventilation and respiratory function stable Cardiovascular status: blood pressure returned to baseline and stable Postop Assessment: no apparent nausea or vomiting Anesthetic complications: no    Last Vitals:  Vitals:   08/27/19 0938 08/27/19 0953  BP: (!) 123/58 138/71  Pulse:  77  Resp:  14  Temp: 36.7 C   SpO2:  100%    Last Pain:  Vitals:   08/27/19 0938  PainSc: [place] [name]

## 2019-09-02 ENCOUNTER — Encounter: Payer: Self-pay | Admitting: Orthopedic Surgery

## 2019-09-02 ENCOUNTER — Ambulatory Visit (INDEPENDENT_AMBULATORY_CARE_PROVIDER_SITE_OTHER): Payer: Medicare Other | Admitting: Physician Assistant

## 2019-09-02 ENCOUNTER — Other Ambulatory Visit: Payer: Self-pay

## 2019-09-02 DIAGNOSIS — L97514 Non-pressure chronic ulcer of other part of right foot with necrosis of bone: Secondary | ICD-10-CM

## 2019-09-02 NOTE — Progress Notes (Signed)
Office Visit Note   Patient: [name]           Date of Birth: [birth year]           MRN: [id number] Visit Date: 09/02/2019              Requested by: Lorenda Hatchet, FNP [address] [place],  [place] [address] PCP: Lorenda Hatchet, FNP  Chief Complaint  Patient presents with  . Right Foot - Pain      HPI: Patient presents today 1 week status post ray amputation.  He is overall doing well.  He is wondering when he can have his new brace made.  Assessment & Plan: Visit Diagnoses: No diagnosis found.  Plan: Patient may wash wound with mild soap and water and do daily dry dressing changes he will follow-up in 1 week.  Follow-Up Instructions: No follow-ups on file.   Ortho Exam  Patient is alert, oriented, no adenopathy, well-dressed, normal affect, normal respiratory effort. Focused examination demonstrates macerated skin but the wound is well opposed with wound edges.  No evidence of necrosis.  Swelling is well controlled.  Sutures are intact.  No cellulitis.  Imaging: No results found.   Labs: Lab Results  Component Value Date   HGBA1C 6.3 (H) 12/14/2018   HGBA1C 6.0 (H) 12/24/2015   HGBA1C 7.0 (H) 12/25/2013   ESRSEDRATE 131 (H) 12/14/2018   ESRSEDRATE 16 02/06/2018   ESRSEDRATE 53 (H) 09/15/2012   CRP 32.1 (H) 12/14/2018   REPTSTATUS 12/18/2018 FINAL 12/15/2018   GRAMSTAIN  12/15/2018    FEW WBC PRESENT,BOTH PMN AND MONONUCLEAR FEW GRAM POSITIVE COCCI FEW GRAM NEGATIVE RODS RARE GRAM POSITIVE RODS    CULT  12/15/2018    FEW ESCHERICHIA COLI FEW CORYNEBACTERIUM STRIATUM FEW PREVOTELLA MELANINOGENICA BETA LACTAMASE POSITIVE Performed at [facility], [place] [address] [place], [place] [address]    LABORGA ESCHERICHIA COLI 12/15/2018     Lab Results  Component Value Date   ALBUMIN 3.0 (L) 12/14/2018   ALBUMIN 3.7 12/24/2015   ALBUMIN 3.7 12/28/2014   PREALBUMIN 14.8 (L) 12/14/2018    No results found for: MG No  results found for: VD25OH  Lab Results  Component Value Date   PREALBUMIN 14.8 (L) 12/14/2018   CBC EXTENDED Latest Ref Rng & Units 08/27/2019 06/25/2019 05/20/2019  WBC 3.8 - 10.8 Thousand/uL - 6.9 -  RBC 4.20 - 5.80 Million/uL - 3.57(L) -  HGB 13.0 - 17.0 g/dL 7.8(L) 10.3(L) 9.5(L)  HCT 39.0 - 52.0 % 23.0(L) 30.6(L) 28.0(L)  PLT 140 - 400 Thousand/uL - 220 -  NEUTROABS 1,500 - 7,800 cells/uL - 5,223 -  LYMPHSABS 850 - 3,900 cells/uL - 780(L) -     There is no height or weight on file to calculate BMI.  Orders:  No orders of the defined types were placed in this encounter.  No orders of the defined types were placed in this encounter.    Procedures: No procedures performed  Clinical Data: No additional findings.  ROS:  All other systems negative, except as noted in the HPI. Review of Systems  Objective: Vital Signs: There were no vitals taken for this visit.  Specialty Comments:  No specialty comments available.  PMFS History: Patient Active Problem List   Diagnosis Date Noted  . Ulcerated, foot, right, with necrosis of bone ([place])   . Chronic cough 06/25/2019  . Cutaneous abscess of right foot   . Subacute osteomyelitis, right ankle and  foot ([place]) 12/14/2018  . AKI (acute kidney injury) ([place]) 12/14/2018  . CKD (chronic kidney disease) stage 4, GFR 15-29 ml/min (HCC) 12/14/2018  . Gait abnormality 08/24/2018  . Diabetic neuropathy ([place]) 02/06/2018  . Cervical myelopathy ([place]) 02/06/2018  . Onychomycosis 10/30/2017  . Other spondylosis with radiculopathy, cervical region 01/27/2017  . Midfoot ulcer, left, limited to breakdown of skin ([place]) 11/15/2016  . Lateral epicondylitis, left elbow 08/12/2016  . Cellulitis of fifth toe of right foot 07/29/2016  . Prostate cancer ([place]) 06/19/2016  . Non-pressure chronic ulcer of other part of right foot limited to breakdown of skin ([place]) 04/04/2016  . Cellulitis of leg, right 12/24/2015  . Cellulitis of right lower  extremity 12/24/2015  . CKD (chronic kidney disease), stage III 12/25/2013  . Foot infection 12/24/2013  . Foot ulcer, left ([place]) 12/24/2013  . Diabetic foot ulcer ([place]) 09/14/2012  . Gout 09/14/2012  . HTN (hypertension) 09/14/2012  . Hypothyroidism 09/14/2012  . CAD (coronary artery disease) 09/14/2012  . Diabetes mellitus ([place]) 09/14/2012  . Hypercholesteremia   . Neuropathy [facility])    Past Medical History:  Diagnosis Date  . Ambulates with cane    straight cane  . Cervical myelopathy ([place]) 02/06/2018  . Chronic kidney disease    dailysis M W F- home  . Coronary artery disease   . Diabetes mellitus without complication ([place])    type2  . Diabetic foot ulcer ([place])   . Diabetic foot ulcer ([place])   . Diabetic neuropathy ([place]) 02/06/2018  . Gait abnormality 08/24/2018  . GERD (gastroesophageal reflux disease)   . Gout   . History of kidney stones    passed stones  . Hypercholesteremia   . Hypertension   . Hypothyroidism   . Neuromuscular disorder (HCC)    neuropathy left leg and bilateral feet  . Neuropathy   . Prostate cancer [facility])     Family History  Problem Relation Age of Onset  . Diabetes Mellitus II Mother   . Diabetes Mellitus II Father   . CAD Father   . Cancer Father        prostate  . Diabetes Mellitus II Brother   . Diabetes Mellitus II Brother   . Stomach cancer Brother 69  . Colon cancer Neg Hx   . Colon polyps Neg Hx   . Esophageal cancer Neg Hx   . Rectal cancer Neg Hx     Past Surgical History:  Procedure Laterality Date  . AMPUTATION Left 12/25/2013   Procedure: AMPUTATION RAY LEFT 5TH RAY;  Surgeon: [name], MD;  [facility];  Service: Orthopedics;  Laterality: Left;  . AMPUTATION Right 12/15/2018   Procedure: AMPUTATION OF 4TH AND 5TH TOES RIGHT FOOT;  Surgeon: [name], MD;  Location: [place];  Service: Orthopedics;  Laterality: Right;  . APPLICATION OF WOUND VAC Right 12/15/2018   Procedure: APPLICATION OF WOUND VAC;  Surgeon: [name], MD;  Location: [place];  Service: Orthopedics;  Laterality: Right;  . AV FISTULA PLACEMENT Left 03/25/2019   Procedure: LEFT ARM ARTERIOVENOUS (AV) FISTULA CREATION;  Surgeon: [name], MD;  Location: [place];  Service: Vascular;  Laterality: Left;  . BACK SURGERY    . BASCILIC VEIN TRANSPOSITION Left 05/20/2019   Procedure: SECOND STAGE LEFT BASCILIC VEIN TRANSPOSITION;  Surgeon: [name], MD;  Location: [place];  Service: Vascular;  Laterality: Left;  . CARDIAC CATHETERIZATION  02/17/2014  . CHOLECYSTECTOMY    . COLONOSCOPY  2011   in [place], [place] GRAFT  2008  . I & D EXTREMITY Right 12/15/2018   Procedure: DEBRIDEMENT RIGHT FOOT;  Surgeon: [name], MD;  Location: [place];  Service: Orthopedics;  Laterality: Right;  . I & D EXTREMITY Right 08/27/2019   Procedure: PARTIAL CUBOID EXCISION RIGHT FOOT;  Surgeon: [name], MD;  Location: [place];  Service: Orthopedics;  Laterality: Right;  . NECK SURGERY     novemver 2019  . WISDOM TOOTH EXTRACTION     Social History   Occupational History  . Not on file  Tobacco Use  . Smoking status: Former Smoker    Types: Cigars    Quit date: 12/24/1988    Years since quitting: 30.7  . Smokeless tobacco: Never Used  Substance and Sexual Activity  . Alcohol use: No  . Drug use: No  . Sexual activity: Yes    Partners: Female

## 2019-09-08 ENCOUNTER — Encounter: Payer: Self-pay | Admitting: Family

## 2019-09-08 ENCOUNTER — Other Ambulatory Visit: Payer: Self-pay

## 2019-09-08 ENCOUNTER — Ambulatory Visit (INDEPENDENT_AMBULATORY_CARE_PROVIDER_SITE_OTHER): Payer: Medicare Other | Admitting: Family

## 2019-09-08 DIAGNOSIS — Z89421 Acquired absence of other right toe(s): Secondary | ICD-10-CM

## 2019-09-08 NOTE — Progress Notes (Signed)
Post-Op Visit Note   Patient: [name]           Date of Birth: [birth year]           MRN: [id number] Visit Date: 09/08/2019 PCP: Lorenda Hatchet, FNP  Chief Complaint: No chief complaint on file.   HPI:  HPI Is a 69 year old gentleman seen today status post right fourth fifth ray amputation right foot.  He is weeks out.  He has been doing dry dressing changes and nonweightbearing.  Unfortunately he did have a fall today prior to arrival and has new abrasions to the second and third toes.  Ortho Exam On examination of the right foot fourth ray amputation this is approximated with sutures there is slight gaping centrally there is no dehiscence there is no active drainage there are some ischemic changes to the distal aspect of his incision no surrounding erythema no odor no sign of infection  Visit Diagnoses:  1. History of partial ray amputation of fourth toe of right foot ([place])     Plan: Continue daily Dial soap cleansing.  Dry dressing changes.  Nonweightbearing.  Provided an order for his custom orthotic with spacer for the right shoe to [employer] per his request.  Follow-Up Instructions: No follow-ups on file.   Imaging: No results found.  Orders:  No orders of the defined types were placed in this encounter.  No orders of the defined types were placed in this encounter.    PMFS History: Patient Active Problem List   Diagnosis Date Noted  . History of partial ray amputation of fourth toe of right foot ([place]) 09/08/2019  . Ulcerated, foot, right, with necrosis of bone ([place])   . Chronic cough 06/25/2019  . Cutaneous abscess of right foot   . Subacute osteomyelitis, right ankle and foot ([place]) 12/14/2018  . AKI (acute kidney injury) ([place]) 12/14/2018  . CKD (chronic kidney disease) stage 4, GFR 15-29 ml/min (HCC) 12/14/2018  . Gait abnormality 08/24/2018  . Diabetic neuropathy ([place]) 02/06/2018  . Cervical myelopathy ([place]) 02/06/2018  . Onychomycosis 10/30/2017  .  Other spondylosis with radiculopathy, cervical region 01/27/2017  . Midfoot ulcer, left, limited to breakdown of skin ([place]) 11/15/2016  . Lateral epicondylitis, left elbow 08/12/2016  . Cellulitis of fifth toe of right foot 07/29/2016  . Prostate cancer ([place]) 06/19/2016  . Non-pressure chronic ulcer of other part of right foot limited to breakdown of skin ([place]) 04/04/2016  . Cellulitis of leg, right 12/24/2015  . Cellulitis of right lower extremity 12/24/2015  . CKD (chronic kidney disease), stage III 12/25/2013  . Foot infection 12/24/2013  . Foot ulcer, left ([place]) 12/24/2013  . Diabetic foot ulcer ([place]) 09/14/2012  . Gout 09/14/2012  . HTN (hypertension) 09/14/2012  . Hypothyroidism 09/14/2012  . CAD (coronary artery disease) 09/14/2012  . Diabetes mellitus ([place]) 09/14/2012  . Hypercholesteremia   . Neuropathy [facility])    Past Medical History:  Diagnosis Date  . Ambulates with cane    straight cane  . Cervical myelopathy ([place]) 02/06/2018  . Chronic kidney disease    dailysis M W F- home  . Coronary artery disease   . Diabetes mellitus without complication ([place])    type2  . Diabetic foot ulcer ([place])   . Diabetic foot ulcer ([place])   . Diabetic neuropathy ([place]) 02/06/2018  . Gait abnormality 08/24/2018  . GERD (gastroesophageal reflux disease)   . Gout   . History of kidney stones    passed stones  . Hypercholesteremia   .  Hypertension   . Hypothyroidism   . Neuromuscular disorder (HCC)    neuropathy left leg and bilateral feet  . Neuropathy   . Prostate cancer [facility])     Family History  Problem Relation Age of Onset  . Diabetes Mellitus II Mother   . Diabetes Mellitus II Father   . CAD Father   . Cancer Father        prostate  . Diabetes Mellitus II Brother   . Diabetes Mellitus II Brother   . Stomach cancer Brother 13  . Colon cancer Neg Hx   . Colon polyps Neg Hx   . Esophageal cancer Neg Hx   . Rectal cancer Neg Hx     Past Surgical History:  Procedure  Laterality Date  . AMPUTATION Left 12/25/2013   Procedure: AMPUTATION RAY LEFT 5TH RAY;  Surgeon: [name], MD;  [facility];  Service: Orthopedics;  Laterality: Left;  . AMPUTATION Right 12/15/2018   Procedure: AMPUTATION OF 4TH AND 5TH TOES RIGHT FOOT;  Surgeon: [name], MD;  Location: [name];  Service: Orthopedics;  Laterality: Right;  . APPLICATION OF WOUND VAC Right 12/15/2018   Procedure: APPLICATION OF WOUND VAC;  Surgeon: [name], MD;  Location: [place];  Service: Orthopedics;  Laterality: Right;  . AV FISTULA PLACEMENT Left 03/25/2019   Procedure: LEFT ARM ARTERIOVENOUS (AV) FISTULA CREATION;  Surgeon: [name], MD;  Location: [place];  Service: Vascular;  Laterality: Left;  . BACK SURGERY    . BASCILIC VEIN TRANSPOSITION Left 05/20/2019   Procedure: SECOND STAGE LEFT BASCILIC VEIN TRANSPOSITION;  Surgeon: [name], MD;  Location: [place];  Service: Vascular;  Laterality: Left;  . CARDIAC CATHETERIZATION  02/17/2014  . CHOLECYSTECTOMY    . COLONOSCOPY  2011   in [place], [place] GRAFT  2008  . I & D EXTREMITY Right 12/15/2018   Procedure: DEBRIDEMENT RIGHT FOOT;  Surgeon: [name], MD;  Location: [place];  Service: Orthopedics;  Laterality: Right;  . I & D EXTREMITY Right 08/27/2019   Procedure: PARTIAL CUBOID EXCISION RIGHT FOOT;  Surgeon: [name], MD;  Location: [place];  Service: Orthopedics;  Laterality: Right;  . NECK SURGERY     novemver 2019  . WISDOM TOOTH EXTRACTION     Social History   Occupational History  . Not on file  Tobacco Use  . Smoking status: Former Smoker    Types: Cigars    Quit date: 12/24/1988    Years since quitting: 30.7  . Smokeless tobacco: Never Used  Substance and Sexual Activity  . Alcohol use: No  . Drug use: No  . Sexual activity: Yes    Partners: Female

## 2019-09-10 ENCOUNTER — Ambulatory Visit: Payer: Medicare Other | Admitting: Family

## 2019-09-10 ENCOUNTER — Ambulatory Visit: Payer: Medicare Other | Admitting: Internal Medicine

## 2019-09-15 ENCOUNTER — Encounter: Payer: Medicare Other | Admitting: Internal Medicine

## 2019-09-17 ENCOUNTER — Ambulatory Visit (INDEPENDENT_AMBULATORY_CARE_PROVIDER_SITE_OTHER): Payer: Medicare Other | Admitting: Family

## 2019-09-17 ENCOUNTER — Encounter: Payer: Self-pay | Admitting: Family

## 2019-09-17 ENCOUNTER — Ambulatory Visit: Payer: Medicare Other | Admitting: Internal Medicine

## 2019-09-17 ENCOUNTER — Other Ambulatory Visit: Payer: Self-pay

## 2019-09-17 VITALS — Ht 70.0 in | Wt 202.0 lb

## 2019-09-17 DIAGNOSIS — Z89421 Acquired absence of other right toe(s): Secondary | ICD-10-CM

## 2019-09-17 NOTE — Progress Notes (Signed)
Post-Op Visit Note   Patient: [name]           Date of Birth: April 19, 1950           MRN: [id number] Visit Date: 09/17/2019 PCP: Lorenda Hatchet, FNP  Chief Complaint:  Chief Complaint  Patient presents with  . Right Foot - Routine Post Op    08/27/19 right 4th and 5th ray amputation     HPI:  HPI Patient is a 69 year old gentleman who presents today status post right fourth and fifth ray amputations.  The incision is proximately sutures he has been nonweightbearing doing daily dry dressing changes. Ortho Exam There was some mild dehiscence distally.  There is 1 ulcerative area that is open about 5 mm in diameter this is filled in with 60% granulation tissue.  There is no visible bone or tendon.  Overall incision is healing well sutures harvested today without incident there is no surrounding erythema no maceration  Visit Diagnoses:  1. History of partial ray amputation of fourth toe of right foot ([place])     Plan: He will continue daily Dial soap cleansing.  Mupirocin dressing changes daily continue to offload the forefoot he will follow-up in 1 week.  Follow-Up Instructions: Return in about 6 days (around 09/23/2019).   Imaging: No results found.  Orders:  No orders of the defined types were placed in this encounter.  No orders of the defined types were placed in this encounter.    PMFS History: Patient Active Problem List   Diagnosis Date Noted  . History of partial ray amputation of fourth toe of right foot ([name]) 09/08/2019  . Ulcerated, foot, right, with necrosis of bone ([place])   . Chronic cough 06/25/2019  . Cutaneous abscess of right foot   . Subacute osteomyelitis, right ankle and foot ([place]) 12/14/2018  . AKI (acute kidney injury) ([place]) 12/14/2018  . CKD (chronic kidney disease) stage 4, GFR 15-29 ml/min (HCC) 12/14/2018  . Gait abnormality 08/24/2018  . Diabetic neuropathy ([place]) 02/06/2018  . Cervical myelopathy ([place]) 02/06/2018  . Onychomycosis  10/30/2017  . Other spondylosis with radiculopathy, cervical region 01/27/2017  . Midfoot ulcer, left, limited to breakdown of skin ([place]) 11/15/2016  . Lateral epicondylitis, left elbow 08/12/2016  . Cellulitis of fifth toe of right foot 07/29/2016  . Prostate cancer ([place]) 06/19/2016  . Non-pressure chronic ulcer of other part of right foot limited to breakdown of skin ([place]) 04/04/2016  . Cellulitis of leg, right 12/24/2015  . Cellulitis of right lower extremity 12/24/2015  . CKD (chronic kidney disease), stage III 12/25/2013  . Foot infection 12/24/2013  . Foot ulcer, left ([place]) 12/24/2013  . Diabetic foot ulcer ([place]) 09/14/2012  . Gout 09/14/2012  . HTN (hypertension) 09/14/2012  . Hypothyroidism 09/14/2012  . CAD (coronary artery disease) 09/14/2012  . Diabetes mellitus ([place]) 09/14/2012  . Hypercholesteremia   . Neuropathy [facility])    Past Medical History:  Diagnosis Date  . Ambulates with cane    straight cane  . Cervical myelopathy ([place]) 02/06/2018  . Chronic kidney disease    dailysis M W F- home  . Coronary artery disease   . Diabetes mellitus without complication ([place])    type2  . Diabetic foot ulcer ([place])   . Diabetic foot ulcer ([place])   . Diabetic neuropathy ([place]) 02/06/2018  . Gait abnormality 08/24/2018  . GERD (gastroesophageal reflux disease)   . Gout   . History of kidney stones    passed stones  .  Hypercholesteremia   . Hypertension   . Hypothyroidism   . Neuromuscular disorder (HCC)    neuropathy left leg and bilateral feet  . Neuropathy   . Prostate cancer [facility])     Family History  Problem Relation Age of Onset  . Diabetes Mellitus II Mother   . Diabetes Mellitus II Father   . CAD Father   . Cancer Father        prostate  . Diabetes Mellitus II Brother   . Diabetes Mellitus II Brother   . Stomach cancer Brother 49  . Colon cancer Neg Hx   . Colon polyps Neg Hx   . Esophageal cancer Neg Hx   . Rectal cancer Neg Hx     Past Surgical History:    Procedure Laterality Date  . AMPUTATION Left 12/25/2013   Procedure: AMPUTATION RAY LEFT 5TH RAY;  Surgeon: [name], MD;  [facility];  Service: Orthopedics;  Laterality: Left;  . AMPUTATION Right 12/15/2018   Procedure: AMPUTATION OF 4TH AND 5TH TOES RIGHT FOOT;  Surgeon: [name], MD;  Location: [place];  Service: Orthopedics;  Laterality: Right;  . APPLICATION OF WOUND VAC Right 12/15/2018   Procedure: APPLICATION OF WOUND VAC;  Surgeon: [name], MD;  Location: [place];  Service: Orthopedics;  Laterality: Right;  . AV FISTULA PLACEMENT Left 03/25/2019   Procedure: LEFT ARM ARTERIOVENOUS (AV) FISTULA CREATION;  Surgeon: [name], MD;  Location: [place];  Service: Vascular;  Laterality: Left;  . BACK SURGERY    . BASCILIC VEIN TRANSPOSITION Left 05/20/2019   Procedure: SECOND STAGE LEFT BASCILIC VEIN TRANSPOSITION;  Surgeon: [name], MD;  Location: [place];  Service: Vascular;  Laterality: Left;  . CARDIAC CATHETERIZATION  02/17/2014  . CHOLECYSTECTOMY    . COLONOSCOPY  2011   in [place], [place] GRAFT  2008  . I & D EXTREMITY Right 12/15/2018   Procedure: DEBRIDEMENT RIGHT FOOT;  Surgeon: [name], MD;  Location: [place];  Service: Orthopedics;  Laterality: Right;  . I & D EXTREMITY Right 08/27/2019   Procedure: PARTIAL CUBOID EXCISION RIGHT FOOT;  Surgeon: [name], MD;  Location: [place];  Service: Orthopedics;  Laterality: Right;  . NECK SURGERY     novemver 2019  . WISDOM TOOTH EXTRACTION     Social History   Occupational History  . Not on file  Tobacco Use  . Smoking status: Former Smoker    Types: Cigars    Quit date: 12/24/1988    Years since quitting: 30.7  . Smokeless tobacco: Never Used  Vaping Use  . Vaping Use: Never used  Substance and Sexual Activity  . Alcohol use: No  . Drug use: No  . Sexual activity: Yes    Partners: Female

## 2019-09-18 ENCOUNTER — Other Ambulatory Visit: Payer: Self-pay | Admitting: Internal Medicine

## 2019-09-18 DIAGNOSIS — R053 Chronic cough: Secondary | ICD-10-CM

## 2019-09-24 ENCOUNTER — Encounter: Payer: Self-pay | Admitting: Family

## 2019-09-24 ENCOUNTER — Ambulatory Visit (INDEPENDENT_AMBULATORY_CARE_PROVIDER_SITE_OTHER): Payer: Medicare Other | Admitting: Family

## 2019-09-24 ENCOUNTER — Other Ambulatory Visit: Payer: Self-pay

## 2019-09-24 DIAGNOSIS — Z89421 Acquired absence of other right toe(s): Secondary | ICD-10-CM

## 2019-09-24 NOTE — Progress Notes (Signed)
Post-Op Visit Note   Patient: [name]           Date of Birth: 11/26/1950           MRN: [id number] Visit Date: 09/24/2019 PCP: Lorenda Hatchet, FNP  Chief Complaint:  No chief complaint on file.   HPI:  HPI Patient is a 69 year old gentleman who presents today status post right fourth and fifth ray amputations.  Has been nonweight bearing. Doing dry dressing changes.   Ortho Exam There was some mild dehiscence distally, this is now ulcerated, wound is a diameter of 10 mm in diameter, 100% beefy granulation tissue. There is no visible bone or tendon.  There is no surrounding erythema no maceration. No drainage.  Visit Diagnoses:  1. History of partial ray amputation of fourth toe of right foot ([place])     Plan: He will continue daily Dial soap cleansing.  Mupirocin dressing changes daily. continue to offload the forefoot. Will minimize weight bearing.   Follow-Up Instructions: No follow-ups on file.   Imaging: No results found.  Orders:  No orders of the defined types were placed in this encounter.  No orders of the defined types were placed in this encounter.    PMFS History: Patient Active Problem List   Diagnosis Date Noted  . History of partial ray amputation of fourth toe of right foot ([place]) 09/08/2019  . Ulcerated, foot, right, with necrosis of bone ([place])   . Chronic cough 06/25/2019  . Cutaneous abscess of right foot   . Subacute osteomyelitis, right ankle and foot ([place]) 12/14/2018  . AKI (acute kidney injury) ([place]) 12/14/2018  . CKD (chronic kidney disease) stage 4, GFR 15-29 ml/min (HCC) 12/14/2018  . Gait abnormality 08/24/2018  . Diabetic neuropathy ([place]) 02/06/2018  . Cervical myelopathy ([place]) 02/06/2018  . Onychomycosis 10/30/2017  . Other spondylosis with radiculopathy, cervical region 01/27/2017  . Midfoot ulcer, left, limited to breakdown of skin ([place]) 11/15/2016  . Lateral epicondylitis, left elbow 08/12/2016  . Cellulitis of fifth toe of  right foot 07/29/2016  . Prostate cancer ([place]) 06/19/2016  . Non-pressure chronic ulcer of other part of right foot limited to breakdown of skin ([place]) 04/04/2016  . Cellulitis of leg, right 12/24/2015  . Cellulitis of right lower extremity 12/24/2015  . CKD (chronic kidney disease), stage III 12/25/2013  . Foot infection 12/24/2013  . Foot ulcer, left ([name]) 12/24/2013  . Diabetic foot ulcer ([place]) 09/14/2012  . Gout 09/14/2012  . HTN (hypertension) 09/14/2012  . Hypothyroidism 09/14/2012  . CAD (coronary artery disease) 09/14/2012  . Diabetes mellitus ([place]) 09/14/2012  . Hypercholesteremia   . Neuropathy [facility])    Past Medical History:  Diagnosis Date  . Ambulates with cane    straight cane  . Cervical myelopathy ([place]) 02/06/2018  . Chronic kidney disease    dailysis M W F- home  . Coronary artery disease   . Diabetes mellitus without complication ([place])    type2  . Diabetic foot ulcer ([place])   . Diabetic foot ulcer ([place])   . Diabetic neuropathy ([place]) 02/06/2018  . Gait abnormality 08/24/2018  . GERD (gastroesophageal reflux disease)   . Gout   . History of kidney stones    passed stones  . Hypercholesteremia   . Hypertension   . Hypothyroidism   . Neuromuscular disorder (HCC)    neuropathy left leg and bilateral feet  . Neuropathy   . Prostate cancer [facility])     Family History  Problem Relation  Age of Onset  . Diabetes Mellitus II Mother   . Diabetes Mellitus II Father   . CAD Father   . Cancer Father        prostate  . Diabetes Mellitus II Brother   . Diabetes Mellitus II Brother   . Stomach cancer Brother 74  . Colon cancer Neg Hx   . Colon polyps Neg Hx   . Esophageal cancer Neg Hx   . Rectal cancer Neg Hx     Past Surgical History:  Procedure Laterality Date  . AMPUTATION Left 12/25/2013   Procedure: AMPUTATION RAY LEFT 5TH RAY;  Surgeon: [name], MD;  [facility];  Service: Orthopedics;  Laterality: Left;  . AMPUTATION Right 12/15/2018   Procedure:  AMPUTATION OF 4TH AND 5TH TOES RIGHT FOOT;  Surgeon: [name], MD;  Location: [place];  Service: Orthopedics;  Laterality: Right;  . APPLICATION OF WOUND VAC Right 12/15/2018   Procedure: APPLICATION OF WOUND VAC;  Surgeon: [name], MD;  Location: [place];  Service: Orthopedics;  Laterality: Right;  . AV FISTULA PLACEMENT Left 03/25/2019   Procedure: LEFT ARM ARTERIOVENOUS (AV) FISTULA CREATION;  Surgeon: [name], MD;  Location: [place];  Service: Vascular;  Laterality: Left;  . BACK SURGERY    . BASCILIC VEIN TRANSPOSITION Left 05/20/2019   Procedure: SECOND STAGE LEFT BASCILIC VEIN TRANSPOSITION;  Surgeon: [name], MD;  Location: [place];  Service: Vascular;  Laterality: Left;  . CARDIAC CATHETERIZATION  02/17/2014  . CHOLECYSTECTOMY    . COLONOSCOPY  2011   in [place], [place] GRAFT  2008  . I & D EXTREMITY Right 12/15/2018   Procedure: DEBRIDEMENT RIGHT FOOT;  Surgeon: [name], MD;  Location: [place];  Service: Orthopedics;  Laterality: Right;  . I & D EXTREMITY Right 08/27/2019   Procedure: PARTIAL CUBOID EXCISION RIGHT FOOT;  Surgeon: [name], MD;  Location: [place];  Service: Orthopedics;  Laterality: Right;  . NECK SURGERY     novemver 2019  . WISDOM TOOTH EXTRACTION     Social History   Occupational History  . Not on file  Tobacco Use  . Smoking status: Former Smoker    Types: Cigars    Quit date: 12/24/1988    Years since quitting: 30.7  . Smokeless tobacco: Never Used  Vaping Use  . Vaping Use: Never used  Substance and Sexual Activity  . Alcohol use: No  . Drug use: No  . Sexual activity: Yes    Partners: Female

## 2019-10-08 ENCOUNTER — Ambulatory Visit (INDEPENDENT_AMBULATORY_CARE_PROVIDER_SITE_OTHER): Payer: Medicare Other | Admitting: Family

## 2019-10-08 ENCOUNTER — Other Ambulatory Visit: Payer: Self-pay

## 2019-10-08 ENCOUNTER — Encounter: Payer: Self-pay | Admitting: Family

## 2019-10-08 VITALS — Ht 70.0 in | Wt 202.0 lb

## 2019-10-08 DIAGNOSIS — Z89421 Acquired absence of other right toe(s): Secondary | ICD-10-CM

## 2019-10-08 NOTE — Progress Notes (Signed)
Post-Op Visit Note   Patient: [name]           Date of Birth: 04-18-1950           MRN: [id number] Visit Date: 10/08/2019 PCP: Lorenda Hatchet, FNP  Chief Complaint:  Chief Complaint  Patient presents with  . Right Foot - Follow-up    08/27/19 right 4th and 5th ray amputation     HPI:  HPI Patient is a 69 year old gentleman who presents today status post right fourth and fifth ray amputations.  Has been full weightbearing in regular shoewear his obtain custom orthotics.  He is returned to work.  He states he continues to minimize his weightbearing.  Doing mupirocin dressing changes.  Ortho Exam There was some mild dehiscence distally, this is now ulcerated, 2 separate areas of ulceration these are both 5 mm in length.  There is no probing or depth.  Flat pink tissue in the wound bed.  1 drop of bloody drainage on his dressing.   There is no surrounding erythema no maceration.   Visit Diagnoses:  1. History of partial ray amputation of fourth toe of right foot ([place])     Plan: He will continue daily Dial soap cleansing.  Mupirocin dressing changes daily. Will minimize weight bearing.   Follow-Up Instructions: Return in about 3 weeks (around 10/29/2019).   Imaging: No results found.  Orders:  No orders of the defined types were placed in this encounter.  No orders of the defined types were placed in this encounter.    PMFS History: Patient Active Problem List   Diagnosis Date Noted  . History of partial ray amputation of fourth toe of right foot ([place]) 09/08/2019  . Ulcerated, foot, right, with necrosis of bone ([place])   . Chronic cough 06/25/2019  . Cutaneous abscess of right foot   . Subacute osteomyelitis, right ankle and foot ([place]) 12/14/2018  . AKI (acute kidney injury) ([place]) 12/14/2018  . CKD (chronic kidney disease) stage 4, GFR 15-29 ml/min (HCC) 12/14/2018  . Gait abnormality 08/24/2018  . Diabetic neuropathy ([place]) 02/06/2018  . Cervical myelopathy ([place])  02/06/2018  . Onychomycosis 10/30/2017  . Other spondylosis with radiculopathy, cervical region 01/27/2017  . Midfoot ulcer, left, limited to breakdown of skin ([place]) 11/15/2016  . Lateral epicondylitis, left elbow 08/12/2016  . Cellulitis of fifth toe of right foot 07/29/2016  . Prostate cancer ([place]) 06/19/2016  . Non-pressure chronic ulcer of other part of right foot limited to breakdown of skin ([place]) 04/04/2016  . Cellulitis of leg, right 12/24/2015  . Cellulitis of right lower extremity 12/24/2015  . CKD (chronic kidney disease), stage III 12/25/2013  . Foot infection 12/24/2013  . Foot ulcer, left ([place]) 12/24/2013  . Diabetic foot ulcer ([place]) 09/14/2012  . Gout 09/14/2012  . HTN (hypertension) 09/14/2012  . Hypothyroidism 09/14/2012  . CAD (coronary artery disease) 09/14/2012  . Diabetes mellitus ([place]) 09/14/2012  . Hypercholesteremia   . Neuropathy [facility])    Past Medical History:  Diagnosis Date  . Ambulates with cane    straight cane  . Cervical myelopathy ([place]) 02/06/2018  . Chronic kidney disease    dailysis M W F- home  . Coronary artery disease   . Diabetes mellitus without complication ([place])    type2  . Diabetic foot ulcer ([place])   . Diabetic foot ulcer ([place])   . Diabetic neuropathy ([name]) 02/06/2018  . Gait abnormality 08/24/2018  . GERD (gastroesophageal reflux disease)   . Gout   .  History of kidney stones    passed stones  . Hypercholesteremia   . Hypertension   . Hypothyroidism   . Neuromuscular disorder (HCC)    neuropathy left leg and bilateral feet  . Neuropathy   . Prostate cancer [facility])     Family History  Problem Relation Age of Onset  . Diabetes Mellitus II Mother   . Diabetes Mellitus II Father   . CAD Father   . Cancer Father        prostate  . Diabetes Mellitus II Brother   . Diabetes Mellitus II Brother   . Stomach cancer Brother 67  . Colon cancer Neg Hx   . Colon polyps Neg Hx   . Esophageal cancer Neg Hx   . Rectal cancer Neg Hx       Past Surgical History:  Procedure Laterality Date  . AMPUTATION Left 12/25/2013   Procedure: AMPUTATION RAY LEFT 5TH RAY;  Surgeon: [name], MD;  [facility];  Service: Orthopedics;  Laterality: Left;  . AMPUTATION Right 12/15/2018   Procedure: AMPUTATION OF 4TH AND 5TH TOES RIGHT FOOT;  Surgeon: [name], MD;  Location: [place];  Service: Orthopedics;  Laterality: Right;  . APPLICATION OF WOUND VAC Right 12/15/2018   Procedure: APPLICATION OF WOUND VAC;  Surgeon: [name], MD;  Location: [place];  Service: Orthopedics;  Laterality: Right;  . AV FISTULA PLACEMENT Left 03/25/2019   Procedure: LEFT ARM ARTERIOVENOUS (AV) FISTULA CREATION;  Surgeon: [name], MD;  Location: [place];  Service: Vascular;  Laterality: Left;  . BACK SURGERY    . BASCILIC VEIN TRANSPOSITION Left 05/20/2019   Procedure: SECOND STAGE LEFT BASCILIC VEIN TRANSPOSITION;  Surgeon: [name], MD;  Location: [place];  Service: Vascular;  Laterality: Left;  . CARDIAC CATHETERIZATION  02/17/2014  . CHOLECYSTECTOMY    . COLONOSCOPY  2011   in [place], [place] GRAFT  2008  . I & D EXTREMITY Right 12/15/2018   Procedure: DEBRIDEMENT RIGHT FOOT;  Surgeon: [name], MD;  Location: [place];  Service: Orthopedics;  Laterality: Right;  . I & D EXTREMITY Right 08/27/2019   Procedure: PARTIAL CUBOID EXCISION RIGHT FOOT;  Surgeon: [name], MD;  Location: [place];  Service: Orthopedics;  Laterality: Right;  . NECK SURGERY     novemver 2019  . WISDOM TOOTH EXTRACTION     Social History   Occupational History  . Not on file  Tobacco Use  . Smoking status: Former Smoker    Types: Cigars    Quit date: 12/24/1988    Years since quitting: 30.8  . Smokeless tobacco: Never Used  Vaping Use  . Vaping Use: Never used  Substance and Sexual Activity  . Alcohol use: No  . Drug use: No  . Sexual activity: Yes    Partners: Female

## 2019-10-29 ENCOUNTER — Other Ambulatory Visit: Payer: Self-pay

## 2019-10-29 ENCOUNTER — Ambulatory Visit (INDEPENDENT_AMBULATORY_CARE_PROVIDER_SITE_OTHER): Payer: Medicare Other | Admitting: Family

## 2019-10-29 ENCOUNTER — Encounter: Payer: Self-pay | Admitting: Family

## 2019-10-29 DIAGNOSIS — L97411 Non-pressure chronic ulcer of right heel and midfoot limited to breakdown of skin: Secondary | ICD-10-CM

## 2019-10-29 DIAGNOSIS — Z89421 Acquired absence of other right toe(s): Secondary | ICD-10-CM

## 2019-10-29 MED ORDER — SULFAMETHOXAZOLE-TRIMETHOPRIM 800-160 MG PO TABS
1.0000 | ORAL_TABLET | Freq: Two times a day (BID) | ORAL | 0 refills | Status: DC
Start: 2019-10-29 — End: 2019-12-27

## 2019-10-29 NOTE — Progress Notes (Signed)
Post-Op Visit Note   Patient: [name]           Date of Birth: Jul 24, 1950           MRN: [id number] Visit Date: 10/29/2019 PCP: Lorenda Hatchet, FNP  Chief Complaint: No chief complaint on file.   HPI:  HPI The patient is a 69 year old gentleman seen today status post 4th and 5th ray amputations on right foot. Had been doing well. Had advanced to regular shoe wear with custom orthotics. No concerns today.  Ortho Exam Incision well healed distally. unfortunately able to express purulent drainage from proximal incision area of eschar. This was debrided back to viable tissue. Underlying ulcer is 3 mm in diameter and probes 5 mm deep. No surrounding erythema or warmth. No cellulitis.   Visit Diagnoses: No diagnosis found.  Plan: given silvercell to pack open the wound following daily cleansing. Placed on bactrim for 10 days. Follow up in 2 weeks. Discussed return precautions.  Follow-Up Instructions: No follow-ups on file.   Imaging: No results found.  Orders:  No orders of the defined types were placed in this encounter.  Meds ordered this encounter  Medications  . sulfamethoxazole-trimethoprim (BACTRIM DS) 800-160 MG tablet    Sig: Take 1 tablet by mouth 2 (two) times daily.    Dispense:  20 tablet    Refill:  0     PMFS History: Patient Active Problem List   Diagnosis Date Noted  . History of partial ray amputation of fourth toe of right foot ([place]) 09/08/2019  . Ulcerated, foot, right, with necrosis of bone ([place])   . Chronic cough 06/25/2019  . Cutaneous abscess of right foot   . Subacute osteomyelitis, right ankle and foot ([place]) 12/14/2018  . AKI (acute kidney injury) ([place]) 12/14/2018  . CKD (chronic kidney disease) stage 4, GFR 15-29 ml/min (HCC) 12/14/2018  . Gait abnormality 08/24/2018  . Diabetic neuropathy ([place]) 02/06/2018  . Cervical myelopathy ([place]) 02/06/2018  . Onychomycosis 10/30/2017  . Other spondylosis with radiculopathy, cervical region  01/27/2017  . Midfoot ulcer, left, limited to breakdown of skin ([place]) 11/15/2016  . Lateral epicondylitis, left elbow 08/12/2016  . Cellulitis of fifth toe of right foot 07/29/2016  . Prostate cancer ([place]) 06/19/2016  . Non-pressure chronic ulcer of other part of right foot limited to breakdown of skin ([place]) 04/04/2016  . Cellulitis of leg, right 12/24/2015  . Cellulitis of right lower extremity 12/24/2015  . CKD (chronic kidney disease), stage III 12/25/2013  . Foot infection 12/24/2013  . Foot ulcer, left ([place]) 12/24/2013  . Diabetic foot ulcer ([place]) 09/14/2012  . Gout 09/14/2012  . HTN (hypertension) 09/14/2012  . Hypothyroidism 09/14/2012  . CAD (coronary artery disease) 09/14/2012  . Diabetes mellitus ([place]) 09/14/2012  . Hypercholesteremia   . Neuropathy [facility])    Past Medical History:  Diagnosis Date  . Ambulates with cane    straight cane  . Cervical myelopathy ([place]) 02/06/2018  . Chronic kidney disease    dailysis M W F- home  . Coronary artery disease   . Diabetes mellitus without complication ([place])    type2  . Diabetic foot ulcer ([place])   . Diabetic foot ulcer ([place])   . Diabetic neuropathy ([place]) 02/06/2018  . Gait abnormality 08/24/2018  . GERD (gastroesophageal reflux disease)   . Gout   . History of kidney stones    passed stones  . Hypercholesteremia   . Hypertension   . Hypothyroidism   .  Neuromuscular disorder (HCC)    neuropathy left leg and bilateral feet  . Neuropathy   . Prostate cancer [facility])     Family History  Problem Relation Age of Onset  . Diabetes Mellitus II Mother   . Diabetes Mellitus II Father   . CAD Father   . Cancer Father        prostate  . Diabetes Mellitus II Brother   . Diabetes Mellitus II Brother   . Stomach cancer Brother 78  . Colon cancer Neg Hx   . Colon polyps Neg Hx   . Esophageal cancer Neg Hx   . Rectal cancer Neg Hx     Past Surgical History:  Procedure Laterality Date  . AMPUTATION Left 12/25/2013   Procedure:  AMPUTATION RAY LEFT 5TH RAY;  Surgeon: [name], MD;  [facility];  Service: Orthopedics;  Laterality: Left;  . AMPUTATION Right 12/15/2018   Procedure: AMPUTATION OF 4TH AND 5TH TOES RIGHT FOOT;  Surgeon: [name], MD;  Location: [place];  Service: Orthopedics;  Laterality: Right;  . APPLICATION OF WOUND VAC Right 12/15/2018   Procedure: APPLICATION OF WOUND VAC;  Surgeon: [name], MD;  Location: [place];  Service: Orthopedics;  Laterality: Right;  . AV FISTULA PLACEMENT Left 03/25/2019   Procedure: LEFT ARM ARTERIOVENOUS (AV) FISTULA CREATION;  Surgeon: [name], MD;  Location: [place];  Service: Vascular;  Laterality: Left;  . BACK SURGERY    . BASCILIC VEIN TRANSPOSITION Left 05/20/2019   Procedure: SECOND STAGE LEFT BASCILIC VEIN TRANSPOSITION;  Surgeon: [name], MD;  Location: [place];  Service: Vascular;  Laterality: Left;  . CARDIAC CATHETERIZATION  02/17/2014  . CHOLECYSTECTOMY    . COLONOSCOPY  2011   in [place], [place] GRAFT  2008  . I & D EXTREMITY Right 12/15/2018   Procedure: DEBRIDEMENT RIGHT FOOT;  Surgeon: [name], MD;  Location: [place];  Service: Orthopedics;  Laterality: Right;  . I & D EXTREMITY Right 08/27/2019   Procedure: PARTIAL CUBOID EXCISION RIGHT FOOT;  Surgeon: [name], MD;  Location: [place];  Service: Orthopedics;  Laterality: Right;  . NECK SURGERY     novemver 2019  . WISDOM TOOTH EXTRACTION     Social History   Occupational History  . Not on file  Tobacco Use  . Smoking status: Former Smoker    Types: Cigars    Quit date: 12/24/1988    Years since quitting: 30.8  . Smokeless tobacco: Never Used  Vaping Use  . Vaping Use: Never used  Substance and Sexual Activity  . Alcohol use: No  . Drug use: No  . Sexual activity: Yes    Partners: Female

## 2019-11-12 ENCOUNTER — Encounter: Payer: Self-pay | Admitting: Family

## 2019-11-12 ENCOUNTER — Other Ambulatory Visit: Payer: Self-pay

## 2019-11-12 ENCOUNTER — Ambulatory Visit (INDEPENDENT_AMBULATORY_CARE_PROVIDER_SITE_OTHER): Payer: Medicare Other | Admitting: Family

## 2019-11-12 DIAGNOSIS — M5416 Radiculopathy, lumbar region: Secondary | ICD-10-CM

## 2019-11-12 DIAGNOSIS — L97411 Non-pressure chronic ulcer of right heel and midfoot limited to breakdown of skin: Secondary | ICD-10-CM

## 2019-11-12 DIAGNOSIS — Z89421 Acquired absence of other right toe(s): Secondary | ICD-10-CM

## 2019-11-12 MED ORDER — DOXYCYCLINE HYCLATE 100 MG PO TABS: 100.0000 mg | ORAL_TABLET | Freq: Two times a day (BID) | ORAL | 0 refills | Status: DC

## 2019-11-12 NOTE — Addendum Note (Signed)
Addended by: Suzan Slick on: 11/12/2019 09:41 AM   Modules accepted: Orders

## 2019-11-12 NOTE — Progress Notes (Addendum)
Post-Op Visit Note   Patient: [name]           Date of Birth: 1951-01-14           MRN: [id number] Visit Date: 11/12/2019 PCP: Lorenda Hatchet, FNP  Chief Complaint:  Chief Complaint  Patient presents with   Right Foot - Follow-up    HPI:  HPI The patient is a 69 year old gentleman seen today status post 4th and 5th ray amputations on right foot. Had advanced to regular shoe wear with custom orthotics.   Seen last 2 weeks ago, was treated with Bactrim for infection x 10 days and new ulceration to partial cuboid excision incision which had previously healed. States felt better on bactrim after being off for 2 days had return of tenderness and redness.  The patient also having what he feels to be radicular pain in anterior right thigh, constant aching, burning pain. Has tried voltaren with minimal relief. Chronic low back pain. Worse with extension and weight bearing. No relieving factors. Good relief with ESI in past x 2. No red flag symptoms. No new/recent injuries  Ortho Exam Incision well healed distally. unfortunately has wound over cuboid, is 3 mm in diameter and probes 8 mm deep. Does not probe to bone. There is surrounding erythema or warmth. No ascending cellulitis.   Distal NV intact, strength equal. + straight leg raise on right  Visit Diagnoses: No diagnosis found.  Plan: continue using silvercell to pack open the wound following daily cleansing. Placed on Doxycycline. Follow up in 1 week with [name]. Discussed return precautions.  Follow-Up Instructions: No follow-ups on file.   Imaging: No results found.  Orders:  No orders of the defined types were placed in this encounter.  Meds ordered this encounter  Medications   doxycycline (VIBRA-TABS) 100 MG tablet    Sig: Take 1 tablet (100 mg total) by mouth 2 (two) times daily.    Dispense:  60 tablet    Refill:  0     PMFS History: Patient Active Problem List   Diagnosis Date Noted   History of  partial ray amputation of fourth toe of right foot ([place]) 09/08/2019   Ulcerated, foot, right, with necrosis of bone ([place])    Chronic cough 06/25/2019   Cutaneous abscess of right foot    Subacute osteomyelitis, right ankle and foot ([place]) 12/14/2018   AKI (acute kidney injury) ([place]) 12/14/2018   CKD (chronic kidney disease) stage 4, GFR 15-29 ml/min (HCC) 12/14/2018   Gait abnormality 08/24/2018   Diabetic neuropathy ([place]) 02/06/2018   Cervical myelopathy ([place]) 02/06/2018   Onychomycosis 10/30/2017   Other spondylosis with radiculopathy, cervical region 01/27/2017   Midfoot ulcer, right, limited to breakdown of skin ([place]) 11/15/2016   Lateral epicondylitis, left elbow 08/12/2016   Cellulitis of fifth toe of right foot 07/29/2016   Prostate cancer ([place]) 06/19/2016   Non-pressure chronic ulcer of other part of right foot limited to breakdown of skin ([place]) 04/04/2016   Cellulitis of leg, right 12/24/2015   Cellulitis of right lower extremity 12/24/2015   CKD (chronic kidney disease), stage III 12/25/2013   Foot infection 12/24/2013   Foot ulcer, left ([place]) 12/24/2013   Diabetic foot ulcer ([place]) 09/14/2012   Gout 09/14/2012   HTN (hypertension) 09/14/2012   Hypothyroidism 09/14/2012   CAD (coronary artery disease) 09/14/2012   Diabetes mellitus ([place]) 09/14/2012   Hypercholesteremia    Neuropathy ([place])    Past Medical History:  Diagnosis Date  Ambulates with cane    straight cane   Cervical myelopathy (HCC) 02/06/2018   Chronic kidney disease    dailysis M W F- home   Coronary artery disease    Diabetes mellitus without complication ([place])    type2   Diabetic foot ulcer ([place])    Diabetic foot ulcer ([place])    Diabetic neuropathy ([place]) 02/06/2018   Gait abnormality 08/24/2018   GERD (gastroesophageal reflux disease)    Gout    History of kidney stones    passed stones   Hypercholesteremia    Hypertension    Hypothyroidism     Neuromuscular disorder ([place])    neuropathy left leg and bilateral feet   Neuropathy    Prostate cancer ([place])     Family History  Problem Relation Age of Onset   Diabetes Mellitus II Mother    Diabetes Mellitus II Father    CAD Father    Cancer Father        prostate   Diabetes Mellitus II Brother    Diabetes Mellitus II Brother    Stomach cancer Brother 75   Colon cancer Neg Hx    Colon polyps Neg Hx    Esophageal cancer Neg Hx    Rectal cancer Neg Hx     Past Surgical History:  Procedure Laterality Date   AMPUTATION Left 12/25/2013   Procedure: AMPUTATION RAY LEFT 5TH RAY;  Surgeon: [name], MD;  [facility];  Service: Orthopedics;  Laterality: Left;   AMPUTATION Right 12/15/2018   Procedure: AMPUTATION OF 4TH AND 5TH TOES RIGHT FOOT;  Surgeon: [name], MD;  Location: [facility];  Service: Orthopedics;  Laterality: Right;   APPLICATION OF WOUND VAC Right 12/15/2018   Procedure: APPLICATION OF WOUND VAC;  Surgeon: [name], MD;  Location: [facility];  Service: Orthopedics;  Laterality: Right;   AV FISTULA PLACEMENT Left 03/25/2019   Procedure: LEFT ARM ARTERIOVENOUS (AV) FISTULA CREATION;  Surgeon: [name], MD;  Location: [facility];  Service: Vascular;  Laterality: Left;   [place] Left 05/20/2019   Procedure: SECOND STAGE LEFT BASCILIC VEIN TRANSPOSITION;  Surgeon: [name], MD;  Location: [facility];  Service: Vascular;  Laterality: Left;   CARDIAC CATHETERIZATION  02/17/2014   CHOLECYSTECTOMY     COLONOSCOPY  2011   in [place], [place]  2008   I & D EXTREMITY Right 12/15/2018   Procedure: DEBRIDEMENT RIGHT FOOT;  Surgeon: [name], MD;  Location: [facility];  Service: Orthopedics;  Laterality: Right;   I & D EXTREMITY Right 08/27/2019   Procedure: PARTIAL CUBOID EXCISION RIGHT FOOT;  Surgeon: [name], MD;  Location: [facility];  Service: Orthopedics;  Laterality:  Right;   NECK SURGERY     novemver 2019   WISDOM TOOTH EXTRACTION     Social History   Occupational History   Not on file  Tobacco Use   Smoking status: Former Smoker    Types: Cigars    Quit date: 12/24/1988    Years since quitting: 30.9   Smokeless tobacco: Never Used  Vaping Use   Vaping Use: Never used  Substance and Sexual Activity   Alcohol use: No   Drug use: No   Sexual activity: Yes    Partners: Female

## 2019-11-18 ENCOUNTER — Ambulatory Visit (INDEPENDENT_AMBULATORY_CARE_PROVIDER_SITE_OTHER): Payer: Medicare Other | Admitting: Orthopedic Surgery

## 2019-11-18 ENCOUNTER — Encounter: Payer: Self-pay | Admitting: Orthopedic Surgery

## 2019-11-18 ENCOUNTER — Telehealth: Payer: Self-pay | Admitting: Physical Medicine and Rehabilitation

## 2019-11-18 VITALS — Ht 70.0 in | Wt 202.0 lb

## 2019-11-18 DIAGNOSIS — L97411 Non-pressure chronic ulcer of right heel and midfoot limited to breakdown of skin: Secondary | ICD-10-CM

## 2019-11-18 NOTE — Telephone Encounter (Signed)
Documented in referral  

## 2019-11-18 NOTE — Telephone Encounter (Signed)
Patient called returning call to [facility] to set appt. Please call patient at [phone].

## 2019-11-19 ENCOUNTER — Ambulatory Visit (INDEPENDENT_AMBULATORY_CARE_PROVIDER_SITE_OTHER): Payer: Medicare Other | Admitting: Family

## 2019-11-19 ENCOUNTER — Other Ambulatory Visit: Payer: Self-pay

## 2019-11-19 ENCOUNTER — Encounter: Payer: Self-pay | Admitting: Family

## 2019-11-19 VITALS — Ht 70.0 in | Wt 202.0 lb

## 2019-11-19 DIAGNOSIS — L97411 Non-pressure chronic ulcer of right heel and midfoot limited to breakdown of skin: Secondary | ICD-10-CM

## 2019-11-19 DIAGNOSIS — I6523 Occlusion and stenosis of bilateral carotid arteries: Secondary | ICD-10-CM

## 2019-11-19 DIAGNOSIS — Z89421 Acquired absence of other right toe(s): Secondary | ICD-10-CM

## 2019-11-24 ENCOUNTER — Encounter: Payer: Self-pay | Admitting: Family

## 2019-11-24 NOTE — Progress Notes (Signed)
Post-Op Visit Note   Patient: [name]           Date of Birth: 1950/09/23           MRN: [id number] Visit Date: 11/19/2019 PCP: Lorenda Hatchet, FNP  Chief Complaint:  Chief Complaint  Patient presents with  . Right Foot - Routine Post Op    08/27/19 right foot partial cuboid excision     HPI:  HPI The patient is a 69 year old gentleman seen today status post partial cuboid excision as well as remote 4th and 5th ray amputations on right foot. Has advanced to regular shoe wear with custom orthotics. Has an AFO on left. Concerned he may require bracing on right foot, has been ambulating with cane. Having difficulty with ambulation and stability especially of right foot.   Was placed on Bactrim for infection of right foot along incision of most recent surgery. Had resolution of symptoms and some pain with bactrim. A few days off had return of drainage.  Ortho Exam Incision well healed distally. unfortunately has wound over cuboid, is 3 mm in diameter with 5 mm of  Depth today. Does not probe to bone or fascia. There is mild surrounding erythema. no warmth. No ascending cellulitis.   Visit Diagnoses: No diagnosis found.  Plan: will continue with silver dressings. Will continue doxycycline. Given order for bracing to assist with right foot instability and gait. Follow with duda in 1-2 weeks. Patient aware of return precautions.  Follow-Up Instructions: No follow-ups on file.   Imaging: No results found.  Orders:  No orders of the defined types were placed in this encounter.  No orders of the defined types were placed in this encounter.    PMFS History: Patient Active Problem List   Diagnosis Date Noted  . History of partial ray amputation of fourth toe of right foot ([place]) 09/08/2019  . Ulcerated, foot, right, with necrosis of bone ([place])   . Chronic cough 06/25/2019  . Cutaneous abscess of right foot   . Subacute osteomyelitis, right ankle and foot ([place]) 12/14/2018  .  AKI (acute kidney injury) ([place]) 12/14/2018  . CKD (chronic kidney disease) stage 4, GFR 15-29 ml/min (HCC) 12/14/2018  . Gait abnormality 08/24/2018  . Diabetic neuropathy ([place]) 02/06/2018  . Cervical myelopathy ([place]) 02/06/2018  . Onychomycosis 10/30/2017  . Other spondylosis with radiculopathy, cervical region 01/27/2017  . Midfoot ulcer, right, limited to breakdown of skin ([place]) 11/15/2016  . Lateral epicondylitis, left elbow 08/12/2016  . Cellulitis of fifth toe of right foot 07/29/2016  . Prostate cancer ([place]) 06/19/2016  . Non-pressure chronic ulcer of other part of right foot limited to breakdown of skin ([place]) 04/04/2016  . Cellulitis of leg, right 12/24/2015  . Cellulitis of right lower extremity 12/24/2015  . CKD (chronic kidney disease), stage III 12/25/2013  . Foot infection 12/24/2013  . Foot ulcer, left ([place]) 12/24/2013  . Diabetic foot ulcer ([place]) 09/14/2012  . Gout 09/14/2012  . HTN (hypertension) 09/14/2012  . Hypothyroidism 09/14/2012  . CAD (coronary artery disease) 09/14/2012  . Diabetes mellitus ([place]) 09/14/2012  . Hypercholesteremia   . Neuropathy [facility])    Past Medical History:  Diagnosis Date  . Ambulates with cane    straight cane  . Cervical myelopathy ([place]) 02/06/2018  . Chronic kidney disease    dailysis M W F- home  . Coronary artery disease   . Diabetes mellitus without complication ([place])    type2  . Diabetic foot ulcer (  [place])   . Diabetic foot ulcer ([place])   . Diabetic neuropathy ([place]) 02/06/2018  . Gait abnormality 08/24/2018  . GERD (gastroesophageal reflux disease)   . Gout   . History of kidney stones    passed stones  . Hypercholesteremia   . Hypertension   . Hypothyroidism   . Neuromuscular disorder (HCC)    neuropathy left leg and bilateral feet  . Neuropathy   . Prostate cancer [facility])     Family History  Problem Relation Age of Onset  . Diabetes Mellitus II Mother   . Diabetes Mellitus II Father   . CAD Father   . Cancer Father          prostate  . Diabetes Mellitus II Brother   . Diabetes Mellitus II Brother   . Stomach cancer Brother 72  . Colon cancer Neg Hx   . Colon polyps Neg Hx   . Esophageal cancer Neg Hx   . Rectal cancer Neg Hx     Past Surgical History:  Procedure Laterality Date  . AMPUTATION Left 12/25/2013   Procedure: AMPUTATION RAY LEFT 5TH RAY;  Surgeon: [name], MD;  [facility];  Service: Orthopedics;  Laterality: Left;  . AMPUTATION Right 12/15/2018   Procedure: AMPUTATION OF 4TH AND 5TH TOES RIGHT FOOT;  Surgeon: [name], MD;  Location: [place];  Service: Orthopedics;  Laterality: Right;  . APPLICATION OF WOUND VAC Right 12/15/2018   Procedure: APPLICATION OF WOUND VAC;  Surgeon: [name], MD;  [facility];  Service: Orthopedics;  Laterality: Right;  . AV FISTULA PLACEMENT Left 03/25/2019   Procedure: LEFT ARM ARTERIOVENOUS (AV) FISTULA CREATION;  Surgeon: [name], MD;  Location: [place];  Service: Vascular;  Laterality: Left;  . BACK SURGERY    . BASCILIC VEIN TRANSPOSITION Left 05/20/2019   Procedure: SECOND STAGE LEFT BASCILIC VEIN TRANSPOSITION;  Surgeon: [name], MD;  Location: [facility];  Service: Vascular;  Laterality: Left;  . CARDIAC CATHETERIZATION  02/17/2014  . CHOLECYSTECTOMY    . COLONOSCOPY  2011   in [place], [place]  2008  . I & D EXTREMITY Right 12/15/2018   Procedure: DEBRIDEMENT RIGHT FOOT;  Surgeon: [name], MD;  Location: [place] [facility];  Service: Orthopedics;  Laterality: Right;  . I & D EXTREMITY Right 08/27/2019   Procedure: PARTIAL CUBOID EXCISION RIGHT FOOT;  Surgeon: [name], MD;  Location: [place];  Service: Orthopedics;  Laterality: Right;  . NECK SURGERY     novemver 2019  . WISDOM TOOTH EXTRACTION     Social History   Occupational History  . Not on file  Tobacco Use  . Smoking status: Former Smoker    Types: Cigars    Quit date: 12/24/1988    Years since quitting: 30.9  . Smokeless  tobacco: Never Used  Vaping Use  . Vaping Use: Never used  Substance and Sexual Activity  . Alcohol use: No  . Drug use: No  . Sexual activity: Yes    Partners: Female

## 2019-12-03 ENCOUNTER — Ambulatory Visit (INDEPENDENT_AMBULATORY_CARE_PROVIDER_SITE_OTHER): Payer: Medicare Other | Admitting: Family

## 2019-12-03 ENCOUNTER — Encounter: Payer: Self-pay | Admitting: Family

## 2019-12-03 VITALS — Ht 70.0 in | Wt 202.0 lb

## 2019-12-03 DIAGNOSIS — L97411 Non-pressure chronic ulcer of right heel and midfoot limited to breakdown of skin: Secondary | ICD-10-CM | POA: Diagnosis not present

## 2019-12-03 DIAGNOSIS — I6523 Occlusion and stenosis of bilateral carotid arteries: Secondary | ICD-10-CM | POA: Diagnosis not present

## 2019-12-04 ENCOUNTER — Other Ambulatory Visit: Payer: Self-pay | Admitting: Family

## 2019-12-06 ENCOUNTER — Telehealth: Payer: Self-pay | Admitting: Family

## 2019-12-06 ENCOUNTER — Other Ambulatory Visit: Payer: Self-pay

## 2019-12-06 LAB — WOUND CULTURE
GRAM STAIN:: NONE SEEN
MICRO NUMBER:: 10881810
RESULT:: NO GROWTH
SPECIMEN QUALITY:: ADEQUATE

## 2019-12-06 MED ORDER — DOXYCYCLINE HYCLATE 100 MG PO TABS: 100.0000 mg | ORAL_TABLET | Freq: Two times a day (BID) | ORAL | 0 refills | Status: DC

## 2019-12-06 NOTE — Telephone Encounter (Signed)
Yes, refill doxycycline

## 2019-12-06 NOTE — Telephone Encounter (Signed)
Pt has a mid foot ulcer s/p partial cuboid excision 08/27/19. In office on Friday and cultures obtained. They did not grow anthing but pt is wanting a refill on his Doxycycline. Please advise.

## 2019-12-06 NOTE — Telephone Encounter (Signed)
Did you want to refill this med for pt?

## 2019-12-06 NOTE — Telephone Encounter (Signed)
Rx sent to pharm

## 2019-12-06 NOTE — Telephone Encounter (Signed)
Patient called.   He said the wound is still showing puss so he needs antibiotics.   Call back: [phone]

## 2019-12-08 ENCOUNTER — Encounter: Payer: Self-pay | Admitting: Physical Medicine and Rehabilitation

## 2019-12-08 ENCOUNTER — Ambulatory Visit (INDEPENDENT_AMBULATORY_CARE_PROVIDER_SITE_OTHER): Payer: Medicare Other | Admitting: Physical Medicine and Rehabilitation

## 2019-12-08 ENCOUNTER — Telehealth: Payer: Self-pay

## 2019-12-08 ENCOUNTER — Encounter: Payer: Self-pay | Admitting: Family

## 2019-12-08 ENCOUNTER — Other Ambulatory Visit: Payer: Self-pay

## 2019-12-08 ENCOUNTER — Ambulatory Visit: Payer: Self-pay

## 2019-12-08 VITALS — BP 154/69 | HR 77

## 2019-12-08 DIAGNOSIS — M5416 Radiculopathy, lumbar region: Secondary | ICD-10-CM

## 2019-12-08 DIAGNOSIS — M48062 Spinal stenosis, lumbar region with neurogenic claudication: Secondary | ICD-10-CM

## 2019-12-08 MED ORDER — METHYLPREDNISOLONE ACETATE 80 MG/ML IJ SUSP
80.0000 mg | Freq: Once | INTRAMUSCULAR | Status: AC
  Administered 2019-12-08: 80 mg

## 2019-12-08 NOTE — Progress Notes (Signed)
[name] - 69 y.o. male MRN [id number]  Date of birth: [birth year]  Office Visit Note: Visit Date: 12/08/2019 PCP: Lorenda Hatchet, FNP Referred by: Lorenda Hatchet, FNP  Subjective: Chief Complaint  Patient presents with  . Right Leg - Pain  . Right Thigh - Pain   HPI: [name] is a 69 y.o. male who comes in today for planned repeat Bilateral L3-L4 Lumbar epidural steroid injection with fluoroscopic guidance.  The patient has failed conservative care including home exercise, medications, time and activity modification.  This injection will be diagnostic and hopefully therapeutic.  Please see requesting physician notes for further details and justification. Patient received more than 50% pain relief from prior injection.   Referring: Dr. Meridee Score    ROS Otherwise per HPI.  Assessment & Plan: Visit Diagnoses:  1. Lumbar radiculopathy   2. Spinal stenosis of lumbar region with neurogenic claudication     Plan: No additional findings.   Meds & Orders:  Meds ordered this encounter  Medications  . methylPREDNISolone acetate (DEPO-MEDROL) injection 80 mg    Orders Placed This Encounter  Procedures  . XR C-ARM NO REPORT  . Epidural Steroid injection    Follow-up: Return if symptoms worsen or fail to improve.   Procedures: No procedures performed  Lumbosacral Transforaminal Epidural Steroid Injection - Sub-Pedicular Approach with Fluoroscopic Guidance  Patient: [name]      Date of Birth: July 14, 1950 MRN: [id number] PCP: Lorenda Hatchet, FNP      Visit Date: 12/08/2019   Universal Protocol:    Date/Time: 12/08/2019  Consent Given By: the patient  Position: PRONE  Additional Comments: Vital signs were monitored before and after the procedure. Patient was prepped and draped in the usual sterile fashion. The correct patient, procedure, and site was verified.   Injection Procedure Details:  Procedure Site One Meds Administered:  Meds ordered this  encounter  Medications  . methylPREDNISolone acetate (DEPO-MEDROL) injection 80 mg    Laterality: Bilateral  Location/Site:  L3-L4  Needle size: 22 G  Needle type: Spinal  Needle Placement: Transforaminal  Findings:    -Comments: Excellent flow of contrast along the nerve, nerve root and into the epidural space.  Procedure Details: After squaring off the end-plates to get a true AP view, the C-arm was positioned so that an oblique view of the foramen as noted above was visualized. The target area is just inferior to the "nose of the scotty dog" or sub pedicular. The soft tissues overlying this structure were infiltrated with 2-3 ml. of 1% Lidocaine without Epinephrine.  The spinal needle was inserted toward the target using a "trajectory" view along the fluoroscope beam.  Under AP and lateral visualization, the needle was advanced so it did not puncture dura and was located close the 6 O'Clock position of the pedical in AP tracterory. Biplanar projections were used to confirm position. Aspiration was confirmed to be negative for CSF and/or blood. A 1-2 ml. volume of Isovue-250 was injected and flow of contrast was noted at each level. Radiographs were obtained for documentation purposes.   After attaining the desired flow of contrast documented above, a 0.5 to 1.0 ml test dose of 0.25% Marcaine was injected into each respective transforaminal space.  The patient was observed for 90 seconds post injection.  After no sensory deficits were reported, and normal lower extremity motor function was noted,   the above injectate was administered so that equal amounts of the injectate were placed at  each foramen (level) into the transforaminal epidural space.   Additional Comments:  The patient tolerated the procedure well Dressing: 2 x 2 sterile gauze and Band-Aid    Post-procedure details: Patient was observed during the procedure. Post-procedure instructions were reviewed.  Patient left  the clinic in stable condition.      Clinical History: MRI LUMBAR SPINE WITHOUT CONTRAST  TECHNIQUE: Multiplanar, multisequence MR imaging of the lumbar spine was performed. No intravenous contrast was administered.  COMPARISON:  10/12/2018 radiography  FINDINGS: Segmentation:  5 lumbar type vertebrae  Alignment:  Mild levo scoliotic curvature.  Vertebrae:  No fracture, evidence of discitis, or bone lesion.  Conus medullaris and cauda equina: Conus extends to the T12-L1 level. Conus and cauda equina appear normal.  Paraspinal and other soft tissues: Right upper pole renal cyst with apparent complexity likely related to motion.  Disc levels:  T11-12: Advanced disc narrowing with circumferential bulge and posterior element hypertrophy. Spinal stenosis with mild right cord impingement. Right more than left foraminal stenosis.  T12- L1: Left facet spurring.  Negative disc  L1-L2: Bilateral moderate facet spurring with negative disc. Mild spinal stenosis.  L2-L3: Moderate degenerative facet spurring.  Spondylosis.  L3-L4: Advanced facet arthropathy with 5 mm degenerative cyst in the ligamentum flavum. Minor bulging of the disc. Severe spinal stenosis. Both foramina are patent  L4-L5: Advanced disc narrowing with right eccentric protrusion compressing the right L5 nerve root in the subarticular recess. Degenerative facet spurring asymmetric to the right with mild/moderate right foraminal narrowing.  L5-S1:Degenerative facet spurring with asymmetric bulky hypertrophy on the right impinging on the right foraminal L5 nerve root.  IMPRESSION: 1. Generalized facet arthropathy most advanced at L3-4. There is superimposed advanced disc degeneration at T11-12 and L4-5. 2. L3-4 severe spinal stenosis related to facet hypertrophy and a ligamentum flavum ganglion. 3. T11-12 spinal stenosis with mild right cord flattening and biforaminal impingement. 4. L4-5  right foraminal protrusion compressing the L5 nerve root. 5. L5-S1 moderate right foraminal impingement.   Electronically Signed   By: Monte Fantasia M.D.   On: 11/20/2018 10:56   He reports that he quit smoking about 30 years ago. His smoking use included cigars. He has never used smokeless tobacco.  Recent Labs    12/14/18 1739  HGBA1C 6.3*    Objective:  VS:  HT:    WT:   BMI:     BP:(!) 154/69  HR:77bpm  TEMP: ( )  RESP:  Physical Exam Constitutional:      General: He is not in acute distress.    Appearance: Normal appearance. He is not ill-appearing.  HENT:     Head: Normocephalic and atraumatic.     Right Ear: External ear normal.     Left Ear: External ear normal.  Eyes:     Extraocular Movements: Extraocular movements intact.  Cardiovascular:     Rate and Rhythm: Normal rate.     Pulses: Normal pulses.  Abdominal:     General: There is no distension.     Palpations: Abdomen is soft.  Musculoskeletal:        General: No tenderness or signs of injury.     Right lower leg: No edema.     Left lower leg: No edema.     Comments: Patient ambulating with a cane with an awkward gait.  Patient has history of neuropathy and is status post foot surgery.  Skin:    Findings: No erythema or rash.  Neurological:  General: No focal deficit present.     Mental Status: He is alert and oriented to person, place, and time.     Sensory: No sensory deficit.     Motor: No weakness or abnormal muscle tone.     Coordination: Coordination normal.  Psychiatric:        Mood and Affect: Mood normal.        Behavior: Behavior normal.     Ortho Exam  Imaging: No results found.  Past Medical/Family/Surgical/Social History: Medications & Allergies reviewed per EMR, new medications updated. Patient Active Problem List   Diagnosis Date Noted  . History of partial ray amputation of fourth toe of right foot ([place]) 09/08/2019  . Ulcerated, foot, right, with necrosis of bone  ([place])   . Chronic cough 06/25/2019  . Cutaneous abscess of right foot   . Subacute osteomyelitis, right ankle and foot ([place]) 12/14/2018  . AKI (acute kidney injury) ([place]) 12/14/2018  . CKD (chronic kidney disease) stage 4, GFR 15-29 ml/min (HCC) 12/14/2018  . Gait abnormality 08/24/2018  . Diabetic neuropathy ([place]) 02/06/2018  . Cervical myelopathy ([place]) 02/06/2018  . Onychomycosis 10/30/2017  . Other spondylosis with radiculopathy, cervical region 01/27/2017  . Midfoot ulcer, right, limited to breakdown of skin ([place]) 11/15/2016  . Lateral epicondylitis, left elbow 08/12/2016  . Cellulitis of fifth toe of right foot 07/29/2016  . Prostate cancer ([place]) 06/19/2016  . Non-pressure chronic ulcer of other part of right foot limited to breakdown of skin ([place]) 04/04/2016  . Cellulitis of leg, right 12/24/2015  . Cellulitis of right lower extremity 12/24/2015  . CKD (chronic kidney disease), stage III 12/25/2013  . Foot infection 12/24/2013  . Foot ulcer, left ([place]) 12/24/2013  . Diabetic foot ulcer ([place]) 09/14/2012  . Gout 09/14/2012  . HTN (hypertension) 09/14/2012  . Hypothyroidism 09/14/2012  . CAD (coronary artery disease) 09/14/2012  . Diabetes mellitus ([place]) 09/14/2012  . Hypercholesteremia   . Neuropathy [facility])    Past Medical History:  Diagnosis Date  . Ambulates with cane    straight cane  . Cervical myelopathy ([place]) 02/06/2018  . Chronic kidney disease    dailysis M W F- home  . Coronary artery disease   . Diabetes mellitus without complication ([place])    type2  . Diabetic foot ulcer ([place])   . Diabetic foot ulcer ([place])   . Diabetic neuropathy ([place]) 02/06/2018  . Gait abnormality 08/24/2018  . GERD (gastroesophageal reflux disease)   . Gout   . History of kidney stones    passed stones  . Hypercholesteremia   . Hypertension   . Hypothyroidism   . Neuromuscular disorder (HCC)    neuropathy left leg and bilateral feet  . Neuropathy   . Prostate cancer [facility])    Family  History  Problem Relation Age of Onset  . Diabetes Mellitus II Mother   . Diabetes Mellitus II Father   . CAD Father   . Cancer Father        prostate  . Diabetes Mellitus II Brother   . Diabetes Mellitus II Brother   . Stomach cancer Brother 48  . Colon cancer Neg Hx   . Colon polyps Neg Hx   . Esophageal cancer Neg Hx   . Rectal cancer Neg Hx    Past Surgical History:  Procedure Laterality Date  . AMPUTATION Left 12/25/2013   Procedure: AMPUTATION RAY LEFT 5TH RAY;  Surgeon: [name], MD;  [facility]: [facility];  Service: Orthopedics;  Laterality:  Left;  . AMPUTATION Right 12/15/2018   Procedure: AMPUTATION OF 4TH AND 5TH TOES RIGHT FOOT;  Surgeon: [name], MD;  Location: [place];  Service: Orthopedics;  Laterality: Right;  . APPLICATION OF WOUND VAC Right 12/15/2018   Procedure: APPLICATION OF WOUND VAC;  Surgeon: [name], MD;  Location: [place];  Service: Orthopedics;  Laterality: Right;  . AV FISTULA PLACEMENT Left 03/25/2019   Procedure: LEFT ARM ARTERIOVENOUS (AV) FISTULA CREATION;  Surgeon: [name], MD;  Location: [place];  Service: Vascular;  Laterality: Left;  . BACK SURGERY    . BASCILIC VEIN TRANSPOSITION Left 05/20/2019   Procedure: SECOND STAGE LEFT BASCILIC VEIN TRANSPOSITION;  Surgeon: [name], MD;  Location: [place];  Service: Vascular;  Laterality: Left;  . CARDIAC CATHETERIZATION  02/17/2014  . CHOLECYSTECTOMY    . COLONOSCOPY  2011   in [place], [place] GRAFT  2008  . I & D EXTREMITY Right 12/15/2018   Procedure: DEBRIDEMENT RIGHT FOOT;  Surgeon: [name], MD;  Location: [place];  Service: Orthopedics;  Laterality: Right;  . I & D EXTREMITY Right 08/27/2019   Procedure: PARTIAL CUBOID EXCISION RIGHT FOOT;  Surgeon: [name], MD;  Location: [place];  Service: Orthopedics;  Laterality: Right;  . NECK SURGERY     novemver 2019  . WISDOM TOOTH EXTRACTION     Social History   Occupational History  . Not on  file  Tobacco Use  . Smoking status: Former Smoker    Types: Cigars    Quit date: 12/24/1988    Years since quitting: 30.9  . Smokeless tobacco: Never Used  Vaping Use  . Vaping Use: Never used  Substance and Sexual Activity  . Alcohol use: No  . Drug use: No  . Sexual activity: Yes    Partners: Female

## 2019-12-08 NOTE — Progress Notes (Signed)
Post-Op Visit Note   Patient: [name]           Date of Birth: 16-Jul-1950           MRN: [id number] Visit Date: 12/03/2019 PCP: Lorenda Hatchet, FNP  Chief Complaint:  Chief Complaint  Patient presents with  . Right Foot - Routine Post Op    08/27/19 right foot partial cuboid excision     HPI:  HPI The patient is a 69 year old gentleman seen today status post partial cuboid excision as well as remote 4th and 5th ray amputations on right foot. Has advanced to regular shoe wear with custom orthotics. Has an AFO on left. Now in new AFO on right, please with improvement in gait stability.  No worsening of symptoms. Concerned for chronic infection or nonhealing of this wound. Have been using silver cell. Curious about other wound care/dressing options.  Ortho Exam Incision well healed distally. Has wound, which is stable,over cuboid, is 3 mm in diameter with 5 mm of  Depth today. Does not probe to bone or fascia. There is mild surrounding erythema. no warmth. No ascending cellulitis. Surrounding tissue debrided of callus and nonviable tissue. No further skin breakdown.  Visit Diagnoses:  1. Midfoot ulcer, right, limited to breakdown of skin ([place])     Plan: will begin prisma dressings. And return for any worsening. Patient aware of return precautions.   Follow-Up Instructions: Return in about 2 weeks (around 12/17/2019).   Imaging: No results found.  Orders:  Orders Placed This Encounter  Procedures  . Wound culture   No orders of the defined types were placed in this encounter.    PMFS History: Patient Active Problem List   Diagnosis Date Noted  . History of partial ray amputation of fourth toe of right foot ([place]) 09/08/2019  . Ulcerated, foot, right, with necrosis of bone ([place])   . Chronic cough 06/25/2019  . Cutaneous abscess of right foot   . Subacute osteomyelitis, right ankle and foot ([place]) 12/14/2018  . AKI (acute kidney injury) ([place]) 12/14/2018  . CKD  (chronic kidney disease) stage 4, GFR 15-29 ml/min (HCC) 12/14/2018  . Gait abnormality 08/24/2018  . Diabetic neuropathy ([place]) 02/06/2018  . Cervical myelopathy ([place]) 02/06/2018  . Onychomycosis 10/30/2017  . Other spondylosis with radiculopathy, cervical region 01/27/2017  . Midfoot ulcer, right, limited to breakdown of skin ([place]) 11/15/2016  . Lateral epicondylitis, left elbow 08/12/2016  . Cellulitis of fifth toe of right foot 07/29/2016  . Prostate cancer ([place]) 06/19/2016  . Non-pressure chronic ulcer of other part of right foot limited to breakdown of skin ([place]) 04/04/2016  . Cellulitis of leg, right 12/24/2015  . Cellulitis of right lower extremity 12/24/2015  . CKD (chronic kidney disease), stage III 12/25/2013  . Foot infection 12/24/2013  . Foot ulcer, left ([place]) 12/24/2013  . Diabetic foot ulcer ([place]) 09/14/2012  . Gout 09/14/2012  . HTN (hypertension) 09/14/2012  . Hypothyroidism 09/14/2012  . CAD (coronary artery disease) 09/14/2012  . Diabetes mellitus ([place]) 09/14/2012  . Hypercholesteremia   . Neuropathy [facility])    Past Medical History:  Diagnosis Date  . Ambulates with cane    straight cane  . Cervical myelopathy ([place]) 02/06/2018  . Chronic kidney disease    dailysis M W F- home  . Coronary artery disease   . Diabetes mellitus without complication ([place])    type2  . Diabetic foot ulcer ([place])   . Diabetic foot ulcer ([place])   .  Diabetic neuropathy ([place]) 02/06/2018  . Gait abnormality 08/24/2018  . GERD (gastroesophageal reflux disease)   . Gout   . History of kidney stones    passed stones  . Hypercholesteremia   . Hypertension   . Hypothyroidism   . Neuromuscular disorder (HCC)    neuropathy left leg and bilateral feet  . Neuropathy   . Prostate cancer [facility])     Family History  Problem Relation Age of Onset  . Diabetes Mellitus II Mother   . Diabetes Mellitus II Father   . CAD Father   . Cancer Father        prostate  . Diabetes Mellitus II Brother     . Diabetes Mellitus II Brother   . Stomach cancer Brother 76  . Colon cancer Neg Hx   . Colon polyps Neg Hx   . Esophageal cancer Neg Hx   . Rectal cancer Neg Hx     Past Surgical History:  Procedure Laterality Date  . AMPUTATION Left 12/25/2013   Procedure: AMPUTATION RAY LEFT 5TH RAY;  Surgeon: [name], MD;  [facility];  Service: Orthopedics;  Laterality: Left;  . AMPUTATION Right 12/15/2018   Procedure: AMPUTATION OF 4TH AND 5TH TOES RIGHT FOOT;  Surgeon: [name], MD;  Location: [place];  Service: Orthopedics;  Laterality: Right;  . APPLICATION OF WOUND VAC Right 12/15/2018   Procedure: APPLICATION OF WOUND VAC;  Surgeon: [name], MD;  Location: [place];  Service: Orthopedics;  Laterality: Right;  . AV FISTULA PLACEMENT Left 03/25/2019   Procedure: LEFT ARM ARTERIOVENOUS (AV) FISTULA CREATION;  Surgeon: [name], MD;  Location: [place];  Service: Vascular;  Laterality: Left;  . BACK SURGERY    . BASCILIC VEIN TRANSPOSITION Left 05/20/2019   Procedure: SECOND STAGE LEFT BASCILIC VEIN TRANSPOSITION;  Surgeon: [name], MD;  Location: [place];  Service: Vascular;  Laterality: Left;  . CARDIAC CATHETERIZATION  02/17/2014  . CHOLECYSTECTOMY    . COLONOSCOPY  2011   in [place], [place] GRAFT  2008  . I & D EXTREMITY Right 12/15/2018   Procedure: DEBRIDEMENT RIGHT FOOT;  Surgeon: [name], MD;  Location: [place];  Service: Orthopedics;  Laterality: Right;  . I & D EXTREMITY Right 08/27/2019   Procedure: PARTIAL CUBOID EXCISION RIGHT FOOT;  Surgeon: [name], MD;  Location: [place];  Service: Orthopedics;  Laterality: Right;  . NECK SURGERY     novemver 2019  . WISDOM TOOTH EXTRACTION     Social History   Occupational History  . Not on file  Tobacco Use  . Smoking status: Former Smoker    Types: Cigars    Quit date: 12/24/1988    Years since quitting: 30.9  . Smokeless tobacco: Never Used  Vaping Use  . Vaping Use:  Never used  Substance and Sexual Activity  . Alcohol use: No  . Drug use: No  . Sexual activity: Yes    Partners: Female

## 2019-12-08 NOTE — Telephone Encounter (Signed)
Called pt and advised of wound culture results. Voiced understanding and will follow up at appt 12/17/19

## 2019-12-08 NOTE — Procedures (Signed)
Lumbosacral Transforaminal Epidural Steroid Injection - Sub-Pedicular Approach with Fluoroscopic Guidance  Patient: [name]      Date of Birth: January 22, 1951 MRN: [id number] PCP: Lorenda Hatchet, FNP      Visit Date: 12/08/2019   Universal Protocol:    Date/Time: 12/08/2019  Consent Given By: the patient  Position: PRONE  Additional Comments: Vital signs were monitored before and after the procedure. Patient was prepped and draped in the usual sterile fashion. The correct patient, procedure, and site was verified.   Injection Procedure Details:  Procedure Site One Meds Administered:  Meds ordered this encounter  Medications  . methylPREDNISolone acetate (DEPO-MEDROL) injection 80 mg    Laterality: Bilateral  Location/Site:  L3-L4  Needle size: 22 G  Needle type: Spinal  Needle Placement: Transforaminal  Findings:    -Comments: Excellent flow of contrast along the nerve, nerve root and into the epidural space.  Procedure Details: After squaring off the end-plates to get a true AP view, the C-arm was positioned so that an oblique view of the foramen as noted above was visualized. The target area is just inferior to the "nose of the scotty dog" or sub pedicular. The soft tissues overlying this structure were infiltrated with 2-3 ml. of 1% Lidocaine without Epinephrine.  The spinal needle was inserted toward the target using a "trajectory" view along the fluoroscope beam.  Under AP and lateral visualization, the needle was advanced so it did not puncture dura and was located close the 6 O'Clock position of the pedical in AP tracterory. Biplanar projections were used to confirm position. Aspiration was confirmed to be negative for CSF and/or blood. A 1-2 ml. volume of Isovue-250 was injected and flow of contrast was noted at each level. Radiographs were obtained for documentation purposes.   After attaining the desired flow of contrast documented above, a 0.5 to 1.0 ml  test dose of 0.25% Marcaine was injected into each respective transforaminal space.  The patient was observed for 90 seconds post injection.  After no sensory deficits were reported, and normal lower extremity motor function was noted,   the above injectate was administered so that equal amounts of the injectate were placed at each foramen (level) into the transforaminal epidural space.   Additional Comments:  The patient tolerated the procedure well Dressing: 2 x 2 sterile gauze and Band-Aid    Post-procedure details: Patient was observed during the procedure. Post-procedure instructions were reviewed.  Patient left the clinic in stable condition.

## 2019-12-08 NOTE — Progress Notes (Signed)
Pt state that he has pain in his leg and upper thigh. Pt state walking makes the pain worse.  Pt has hx of inj on 06/03/19 pt state that the inj was great.  Numeric Pain Rating Scale and Functional Assessment Average Pain 5   In the last MONTH (on 0-10 scale) has pain interfered with the following?  1. General activity like being  able to carry out your everyday physical activities such as walking, climbing stairs, carrying groceries, or moving a chair?  Rating(7)   +Driver, -BT, -Dye Allergies.

## 2019-12-10 ENCOUNTER — Ambulatory Visit: Payer: Medicare Other | Admitting: Family

## 2019-12-10 ENCOUNTER — Encounter: Payer: Self-pay | Admitting: Orthopedic Surgery

## 2019-12-10 NOTE — Progress Notes (Signed)
Patient left without being seen.

## 2019-12-17 ENCOUNTER — Ambulatory Visit (INDEPENDENT_AMBULATORY_CARE_PROVIDER_SITE_OTHER): Payer: Medicare Other | Admitting: Family

## 2019-12-17 DIAGNOSIS — Z89421 Acquired absence of other right toe(s): Secondary | ICD-10-CM | POA: Diagnosis not present

## 2019-12-17 DIAGNOSIS — I6523 Occlusion and stenosis of bilateral carotid arteries: Secondary | ICD-10-CM | POA: Diagnosis not present

## 2019-12-17 DIAGNOSIS — L97411 Non-pressure chronic ulcer of right heel and midfoot limited to breakdown of skin: Secondary | ICD-10-CM | POA: Diagnosis not present

## 2019-12-17 MED ORDER — PURAPLY 2X4CM EX SHEE: 1.0000 cm2 | MEDICATED_PATCH | CUTANEOUS | 2 refills | Status: DC

## 2019-12-17 MED ORDER — PURAPLY ANTIMICROBIAL 4X4CM EX SHEE: 1.0000 cm2 | MEDICATED_PATCH | CUTANEOUS | 2 refills | Status: DC

## 2019-12-17 MED ORDER — PURAPLY ANTIMICROBIAL 4X4CM EX SHEE: 1.0000 cm2 | MEDICATED_PATCH | CUTANEOUS | 2 refills | Status: AC

## 2019-12-18 ENCOUNTER — Other Ambulatory Visit: Payer: Self-pay | Admitting: Internal Medicine

## 2019-12-18 ENCOUNTER — Encounter: Payer: Self-pay | Admitting: Physical Medicine and Rehabilitation

## 2019-12-18 DIAGNOSIS — R053 Chronic cough: Secondary | ICD-10-CM

## 2019-12-18 NOTE — Progress Notes (Signed)
[name] - 69 y.o. male MRN [id number]  Date of birth: 22-May-1950  Office Visit Note: Visit Date: 12/02/2018 PCP: Lorenda Hatchet, FNP Referred by: Fanny Bien, MD  Subjective: Chief Complaint  Patient presents with  . Lower Back - Pain   HPI: [name] is a 69 y.o. male who comes in today At the request of Dr. Meridee Score for consultation for increasing low back pain with a history of chronic back pain with radicular pain down both legs.  History of diabetic neuropathy and pretty severe diabetes in general.  He has had multiple complications including amputation.  He has had not any specific injury to the lumbar spine but he is on chronic pain medication.  He has had prior ACDF of the cervical spine with history of myelopathy from that.  MRI was completed after failure of conservative care including medication management and therapy.  Lumbar MRI is reviewed with the patient today using spine models and imaging.  Patient does not endorse any focal weakness more than usual.  He has had weakness in general.  He does have difficulty with movement.  He does have difficulty with balance.  MRI mainly shows severe stenosis at L3-4 with more local foraminal narrowing at L4-5 on the right and facet arthropathy.  No prior lumbar surgery.  Review of Systems  Musculoskeletal: Positive for back pain, joint pain and neck pain.  Neurological: Positive for tingling, sensory change and weakness.  All other systems reviewed and are negative.  Otherwise per HPI.  Assessment & Plan: Visit Diagnoses:  1. Spinal stenosis of lumbar region with neurogenic claudication   2. Lumbar radiculopathy   3. Type 2 diabetes mellitus with diabetic polyneuropathy, unspecified whether long term insulin use (HCC)     Plan: Findings:  Chronic history of back pain and neck pain and chronic pain syndrome complicated by diabetes and polyneuropathy and myelopathy now with worsening low back pain and bilateral  radicular pain over the last several months with failure of conservative care including medication management and therapy.  MRI consistent with severe lumbar stenosis.  Patient probably not a great surgical candidate with his health conditions.  We will schedule him for bilateral L3 transforaminal epidural steroid injection with fluoroscopic guidance and this would be diagnostic and hopefully therapeutic.  This could be done intermittently depending on his relief.    Meds & Orders: No orders of the defined types were placed in this encounter.  No orders of the defined types were placed in this encounter.   Follow-up: Return for Bilateral L3 transforaminal epidural steroid injection.   Procedures: No procedures performed  No notes on file   Clinical History: MRI LUMBAR SPINE WITHOUT CONTRAST  TECHNIQUE: Multiplanar, multisequence MR imaging of the lumbar spine was performed. No intravenous contrast was administered.  COMPARISON:  10/12/2018 radiography  FINDINGS: Segmentation:  5 lumbar type vertebrae  Alignment:  Mild levo scoliotic curvature.  Vertebrae:  No fracture, evidence of discitis, or bone lesion.  Conus medullaris and cauda equina: Conus extends to the T12-L1 level. Conus and cauda equina appear normal.  Paraspinal and other soft tissues: Right upper pole renal cyst with apparent complexity likely related to motion.  Disc levels:  T11-12: Advanced disc narrowing with circumferential bulge and posterior element hypertrophy. Spinal stenosis with mild right cord impingement. Right more than left foraminal stenosis.  T12- L1: Left facet spurring.  Negative disc  L1-L2: Bilateral moderate facet spurring with negative disc. Mild spinal stenosis.  L2-L3:  Moderate degenerative facet spurring.  Spondylosis.  L3-L4: Advanced facet arthropathy with 5 mm degenerative cyst in the ligamentum flavum. Minor bulging of the disc. Severe spinal stenosis. Both  foramina are patent  L4-L5: Advanced disc narrowing with right eccentric protrusion compressing the right L5 nerve root in the subarticular recess. Degenerative facet spurring asymmetric to the right with mild/moderate right foraminal narrowing.  L5-S1:Degenerative facet spurring with asymmetric bulky hypertrophy on the right impinging on the right foraminal L5 nerve root.  IMPRESSION: 1. Generalized facet arthropathy most advanced at L3-4. There is superimposed advanced disc degeneration at T11-12 and L4-5. 2. L3-4 severe spinal stenosis related to facet hypertrophy and a ligamentum flavum ganglion. 3. T11-12 spinal stenosis with mild right cord flattening and biforaminal impingement. 4. L4-5 right foraminal protrusion compressing the L5 nerve root. 5. L5-S1 moderate right foraminal impingement.   Electronically Signed   By: Monte Fantasia M.D.   On: 11/20/2018 10:56   He reports that he quit smoking about 31 years ago. His smoking use included cigars. He has never used smokeless tobacco. No results for input(s): HGBA1C, LABURIC in the last 8760 hours.  Objective:  VS:  HT:    WT:   BMI:     BP:(!) 158/90  HR:70bpm  TEMP: ( )  RESP:  Physical Exam Vitals and nursing note reviewed.  Constitutional:      General: He is not in acute distress.    Appearance: Normal appearance. He is well-developed.  HENT:     Head: Normocephalic and atraumatic.  Eyes:     Conjunctiva/sclera: Conjunctivae normal.     Pupils: Pupils are equal, round, and reactive to light.  Cardiovascular:     Rate and Rhythm: Normal rate.     Pulses: Normal pulses.     Heart sounds: Normal heart sounds.  Pulmonary:     Effort: Pulmonary effort is normal. No respiratory distress.  Musculoskeletal:     Cervical back: Normal range of motion and neck supple. No rigidity.     Right lower leg: No edema.     Left lower leg: No edema.     Comments: Patient with good proximal strength in the hips  bilaterally.  No pain with hip rotation.  No pain over the greater trochanters.  He has global decrease sensation in the lower extremities past the knees.  He has negative slump test.  Skin:    General: Skin is warm and dry.     Findings: No erythema or rash.  Neurological:     General: No focal deficit present.     Mental Status: He is alert and oriented to person, place, and time.     Sensory: Sensory deficit present.     Motor: Weakness present.     Coordination: Coordination normal.     Gait: Gait abnormal.  Psychiatric:        Mood and Affect: Mood normal.        Behavior: Behavior normal.     Ortho Exam  Imaging: No results found.  Past Medical/Family/Surgical/Social History: Medications & Allergies reviewed per EMR, new medications updated. Patient Active Problem List   Diagnosis Date Noted  . History of partial ray amputation of fourth toe of right foot ([place]) 09/08/2019  . Ulcerated, foot, right, with necrosis of bone ([place])   . Chronic cough 06/25/2019  . Cutaneous abscess of right foot   . Subacute osteomyelitis, right ankle and foot ([place]) 12/14/2018  . AKI (acute kidney injury) ([place]) 12/14/2018  .  CKD (chronic kidney disease) stage 4, GFR 15-29 ml/min (HCC) 12/14/2018  . Gait abnormality 08/24/2018  . Diabetic neuropathy ([place]) 02/06/2018  . Cervical myelopathy ([place]) 02/06/2018  . Onychomycosis 10/30/2017  . Other spondylosis with radiculopathy, cervical region 01/27/2017  . Midfoot ulcer, right, limited to breakdown of skin ([place]) 11/15/2016  . Lateral epicondylitis, left elbow 08/12/2016  . Cellulitis of fifth toe of right foot 07/29/2016  . Prostate cancer ([place]) 06/19/2016  . Non-pressure chronic ulcer of other part of right foot limited to breakdown of skin ([place]) 04/04/2016  . Cellulitis of leg, right 12/24/2015  . Cellulitis of right lower extremity 12/24/2015  . CKD (chronic kidney disease), stage III 12/25/2013  . Foot infection 12/24/2013  . Foot ulcer,  left ([place]) 12/24/2013  . Diabetic foot ulcer ([place]) 09/14/2012  . Gout 09/14/2012  . HTN (hypertension) 09/14/2012  . Hypothyroidism 09/14/2012  . CAD (coronary artery disease) 09/14/2012  . Diabetes mellitus ([place]) 09/14/2012  . Hypercholesteremia   . Neuropathy [place])    Past Medical History:  Diagnosis Date  . Ambulates with cane    straight cane  . Cervical myelopathy ([place]) 02/06/2018  . Chronic kidney disease    dailysis M W F- home  . Coronary artery disease   . Diabetes mellitus without complication ([place])    type2  . Diabetic foot ulcer ([place])   . Diabetic foot ulcer ([place])   . Diabetic neuropathy ([place]) 02/06/2018  . Gait abnormality 08/24/2018  . GERD (gastroesophageal reflux disease)   . Gout   . History of kidney stones    passed stones  . Hypercholesteremia   . Hypertension   . Hypothyroidism   . Neuromuscular disorder (HCC)    neuropathy left leg and bilateral feet  . Neuropathy   . Prostate cancer [facility])    Family History  Problem Relation Age of Onset  . Diabetes Mellitus II Mother   . Diabetes Mellitus II Father   . CAD Father   . Cancer Father        prostate  . Diabetes Mellitus II Brother   . Diabetes Mellitus II Brother   . Stomach cancer Brother 68  . Colon cancer Neg Hx   . Colon polyps Neg Hx   . Esophageal cancer Neg Hx   . Rectal cancer Neg Hx    Past Surgical History:  Procedure Laterality Date  . AMPUTATION Left 12/25/2013   Procedure: AMPUTATION RAY LEFT 5TH RAY;  Surgeon: [name], MD;  [facility];  Service: Orthopedics;  Laterality: Left;  . AMPUTATION Right 12/15/2018   Procedure: AMPUTATION OF 4TH AND 5TH TOES RIGHT FOOT;  Surgeon: [name], MD;  Location: [place];  Service: Orthopedics;  Laterality: Right;  . APPLICATION OF WOUND VAC Right 12/15/2018   Procedure: APPLICATION OF WOUND VAC;  Surgeon: [name], MD;  Location: [place];  Service: Orthopedics;  Laterality: Right;  . AV FISTULA PLACEMENT Left 03/25/2019    Procedure: LEFT ARM ARTERIOVENOUS (AV) FISTULA CREATION;  Surgeon: [name], MD;  Location: [place];  Service: Vascular;  Laterality: Left;  . BACK SURGERY    . BASCILIC VEIN TRANSPOSITION Left 05/20/2019   Procedure: SECOND STAGE LEFT BASCILIC VEIN TRANSPOSITION;  Surgeon: [name], MD;  Location: [place];  Service: Vascular;  Laterality: Left;  . CARDIAC CATHETERIZATION  02/17/2014  . CHOLECYSTECTOMY    . COLONOSCOPY  2011   in [place], Normal  . CORONARY ARTERY BYPASS GRAFT  2008  . I & D EXTREMITY Right 12/15/2018   Procedure: DEBRIDEMENT RIGHT FOOT;  Surgeon: [name], MD;  Location: [place];  Service: Orthopedics;  Laterality: Right;  . I & D EXTREMITY Right 08/27/2019   Procedure: PARTIAL CUBOID EXCISION RIGHT FOOT;  Surgeon: [name], MD;  Location: [place];  Service: Orthopedics;  Laterality: Right;  . NECK SURGERY     novemver 2019  . WISDOM TOOTH EXTRACTION     Social History   Occupational History  . Not on file  Tobacco Use  . Smoking status: Former Smoker    Types: Cigars    Quit date: 12/24/1988    Years since quitting: 31.0  . Smokeless tobacco: Never Used  Vaping Use  . Vaping Use: Never used  Substance and Sexual Activity  . Alcohol use: No  . Drug use: No  . Sexual activity: Yes    Partners: Female

## 2019-12-22 ENCOUNTER — Encounter: Payer: Self-pay | Admitting: Family

## 2019-12-22 NOTE — Progress Notes (Signed)
Post-Op Visit Note   Patient: [name]           Date of Birth: December 25, 1950           MRN: [id number] Visit Date: 12/17/2019 PCP: Lorenda Hatchet, FNP  Chief Complaint:  Chief Complaint  Patient presents with  . Right Foot - Routine Post Op    HPI:  HPI The patient is a 69 year old gentleman seen today status post partial cuboid excision as well as remote 4th and 5th ray amputations on right foot. Has advanced to regular shoe wear with custom orthotics and new AFO on right, please with improvement in gait stability. Has returned to work but primarily is seated work with little weight bearing.   Concerned for ongoing nonhealing ulcer over cuboid area of incision. Ongoing serosanguinous drainage. Denies odor. Has been using prisma and manuka honey dressings since last visit. Would like to try a new wound dressing. Also would like ID input for IV antibiotics. He is considering hyperbaric oxygen as well.   Ortho Exam Incision well healed. Continues with wound overlying cuboid. Wound is 3 mm in diameter with 5 mm of  Depth today. Does not probe to bone or fascia. There is mild surrounding erythema. no warmth. No ascending cellulitis. Surrounding tissue debrided of callus and nonviable tissue. No further skin breakdown.  Visit Diagnoses:  1. Midfoot ulcer, right, limited to breakdown of skin ([place])   2. History of partial ray amputation of fourth toe of right foot ([place])     Plan: will begin puraply dressings. Referral to ID placed, appreciate any recommendations/input. No outward signs of acute infection. Cultures recently negative. Concern for underlying infection.  And return for any worsening. Patient aware of return precautions.   Follow-Up Instructions: Return in about 2 weeks (around 12/31/2019).   Imaging: No results found.  Orders:  Orders Placed This Encounter  Procedures  . Ambulatory referral to Infectious Disease   Meds ordered this encounter  Medications  .  DISCONTD: Collagen Matrix Fenest, Porc, (PURAPLY) 2X4CM SHEE    Sig: Apply 1 cm2 topically 1 day or 1 dose for 1 dose.    Dispense:  14 each    Refill:  2  . DISCONTD: Collagen Matrix Fenest, Porc, (PURAPLY) 2X4CM SHEE    Sig: Apply 1 cm2 topically 1 day or 1 dose for 1 dose.    Dispense:  14 each    Refill:  2  . DISCONTD: Collagen-Antimicrobial (PURAPLY ANTIMICROBIAL 4X4CM) SHEE    Sig: Apply 1 cm2 topically 1 day or 1 dose for 1 dose.    Dispense:  14 each    Refill:  2  . Collagen-Antimicrobial (PURAPLY ANTIMICROBIAL 4X4CM) SHEE    Sig: Apply 1 cm2 topically 1 day or 1 dose for 1 dose.    Dispense:  14 each    Refill:  2     PMFS History: Patient Active Problem List   Diagnosis Date Noted  . History of partial ray amputation of fourth toe of right foot ([place]) 09/08/2019  . Ulcerated, foot, right, with necrosis of bone ([place])   . Chronic cough 06/25/2019  . Cutaneous abscess of right foot   . Subacute osteomyelitis, right ankle and foot ([place]) 12/14/2018  . AKI (acute kidney injury) ([place]) 12/14/2018  . CKD (chronic kidney disease) stage 4, GFR 15-29 ml/min (HCC) 12/14/2018  . Gait abnormality 08/24/2018  . Diabetic neuropathy ([place]) 02/06/2018  . Cervical myelopathy ([place]) 02/06/2018  . Onychomycosis 10/30/2017  .  Other spondylosis with radiculopathy, cervical region 01/27/2017  . Midfoot ulcer, right, limited to breakdown of skin ([place]) 11/15/2016  . Lateral epicondylitis, left elbow 08/12/2016  . Cellulitis of fifth toe of right foot 07/29/2016  . Prostate cancer ([place]) 06/19/2016  . Non-pressure chronic ulcer of other part of right foot limited to breakdown of skin ([place]) 04/04/2016  . Cellulitis of leg, right 12/24/2015  . Cellulitis of right lower extremity 12/24/2015  . CKD (chronic kidney disease), stage III 12/25/2013  . Foot infection 12/24/2013  . Foot ulcer, left ([place]) 12/24/2013  . Diabetic foot ulcer ([place]) 09/14/2012  . Gout 09/14/2012  . HTN (hypertension)  09/14/2012  . Hypothyroidism 09/14/2012  . CAD (coronary artery disease) 09/14/2012  . Diabetes mellitus ([place]) 09/14/2012  . Hypercholesteremia   . Neuropathy [facility])    Past Medical History:  Diagnosis Date  . Ambulates with cane    straight cane  . Cervical myelopathy ([place]) 02/06/2018  . Chronic kidney disease    dailysis M W F- home  . Coronary artery disease   . Diabetes mellitus without complication ([place])    type2  . Diabetic foot ulcer ([place])   . Diabetic foot ulcer ([place])   . Diabetic neuropathy ([place]) 02/06/2018  . Gait abnormality 08/24/2018  . GERD (gastroesophageal reflux disease)   . Gout   . History of kidney stones    passed stones  . Hypercholesteremia   . Hypertension   . Hypothyroidism   . Neuromuscular disorder (HCC)    neuropathy left leg and bilateral feet  . Neuropathy   . Prostate cancer [facility])     Family History  Problem Relation Age of Onset  . Diabetes Mellitus II Mother   . Diabetes Mellitus II Father   . CAD Father   . Cancer Father        prostate  . Diabetes Mellitus II Brother   . Diabetes Mellitus II Brother   . Stomach cancer Brother 24  . Colon cancer Neg Hx   . Colon polyps Neg Hx   . Esophageal cancer Neg Hx   . Rectal cancer Neg Hx     Past Surgical History:  Procedure Laterality Date  . AMPUTATION Left 12/25/2013   Procedure: AMPUTATION RAY LEFT 5TH RAY;  Surgeon: [name], MD;  [facility];  Service: Orthopedics;  Laterality: Left;  . AMPUTATION Right 12/15/2018   Procedure: AMPUTATION OF 4TH AND 5TH TOES RIGHT FOOT;  Surgeon: [name], MD;  Location: [place];  Service: Orthopedics;  Laterality: Right;  . APPLICATION OF WOUND VAC Right 12/15/2018   Procedure: APPLICATION OF WOUND VAC;  Surgeon: [name], MD;  Location: [place];  Service: Orthopedics;  Laterality: Right;  . AV FISTULA PLACEMENT Left 03/25/2019   Procedure: LEFT ARM ARTERIOVENOUS (AV) FISTULA CREATION;  Surgeon: [name], MD;  Location: [facility];   Service: Vascular;  Laterality: Left;  . BACK SURGERY    . BASCILIC VEIN TRANSPOSITION Left 05/20/2019   Procedure: SECOND STAGE LEFT BASCILIC VEIN TRANSPOSITION;  Surgeon: [name], MD;  Location: [place];  Service: Vascular;  Laterality: Left;  . CARDIAC CATHETERIZATION  02/17/2014  . CHOLECYSTECTOMY    . COLONOSCOPY  2011   in [place], [place] GRAFT  2008  . I & D EXTREMITY Right 12/15/2018   Procedure: DEBRIDEMENT RIGHT FOOT;  Surgeon: [name], MD;  Location: [place];  Service: Orthopedics;  Laterality: Right;  .  I & D EXTREMITY Right 08/27/2019   Procedure: PARTIAL CUBOID EXCISION RIGHT FOOT;  Surgeon: [name], MD;  Location: [place];  Service: Orthopedics;  Laterality: Right;  . NECK SURGERY     novemver 2019  . WISDOM TOOTH EXTRACTION     Social History   Occupational History  . Not on file  Tobacco Use  . Smoking status: Former Smoker    Types: Cigars    Quit date: 12/24/1988    Years since quitting: 31.0  . Smokeless tobacco: Never Used  Vaping Use  . Vaping Use: Never used  Substance and Sexual Activity  . Alcohol use: No  . Drug use: No  . Sexual activity: Yes    Partners: Female

## 2019-12-27 ENCOUNTER — Telehealth: Payer: Self-pay | Admitting: *Deleted

## 2019-12-27 ENCOUNTER — Encounter: Payer: Self-pay | Admitting: Infectious Diseases

## 2019-12-27 ENCOUNTER — Ambulatory Visit (INDEPENDENT_AMBULATORY_CARE_PROVIDER_SITE_OTHER): Payer: Medicare Other | Admitting: Infectious Diseases

## 2019-12-27 ENCOUNTER — Ambulatory Visit
Admission: RE | Admit: 2019-12-27 | Discharge: 2019-12-27 | Disposition: A | Discharge status: Home / Self Care | Payer: Medicare Other | Source: Ambulatory Visit | Attending: Infectious Diseases | Admitting: Infectious Diseases

## 2019-12-27 ENCOUNTER — Other Ambulatory Visit: Payer: Self-pay

## 2019-12-27 VITALS — BP 163/76 | HR 74 | Temp 97.6°F | Wt 208.0 lb

## 2019-12-27 DIAGNOSIS — M869 Osteomyelitis, unspecified: Secondary | ICD-10-CM

## 2019-12-27 MED ORDER — AMOXICILLIN-POT CLAVULANATE 500-125 MG PO TABS
1.0000 | ORAL_TABLET | Freq: Two times a day (BID) | ORAL | 2 refills | Status: DC
Start: 2019-12-27 — End: 2019-12-29

## 2019-12-27 NOTE — Progress Notes (Signed)
[facility]                                                             [place], [place], [place], [address]                                                                  Phn. [phone]; Fax: [phone]                                                                             Date: 12/27/19  Reason for Referral: Diabetic Foot Ulcer  Referring Provider: Dondra Prader  Assessment Rt Foot Diabetic Ulcer Rt Cuboid Osteomyelitis Diabetes mellitus with Diabetic Neuropathy   Plan The Xray just came back positive for Osteomyelitis at the time of writing this note. So, he will need 6 weeks of antibiotics. Patient is interested in IV abx as he has already been on oral antibiotics. So, I will try to coordinate IV abx with home health. He is not willing to see a podiatrist however I made a referral to see if he needs further debridement to which he agreed. At this point, I do not have any deep tissue cultures or bone cultures to target antibiotics and the antibiotics would be empirical. Given he is ESRD on HD ( 4 times a week), Vancomycin and ceftazidime seems a reasonable option that would not need another central line. [name] and myself spoke withe [facility] [place] today regarding plan for IV vanc and ceftazidime for 6 weeks. He will need weekly, CBC, CMP, vanc trough, ESR and CRP. Orders Placed This Encounter  Procedures  . DG Foot 2 Views Right  . Sedimentation rate  . C-reactive protein  . CBC  . Ambulatory referral to Podiatry   Follow up in 2 weeks   I spent greater than 60 minutes with the patient including greater than 50% of time in face to face counsel of the patient and in coordination of their care.    Rosiland Oz, MD [facility]  Office phone [phone] Fax no.  [phone] ______________________________________________________________________________________________________________________  HPI: 69 Y O Caucasian Male with a PMH of IDDM, Diabetic Neuropathy, CAD, HTN, Hypothyroidism, ESRD on HD via Left arm fistula ( 4 times in a week, HLD, s/p 4th and 5th ray amputation in 12/2018 who is here for evaluation of chronic Rt mid-foot ulcer in the plantar side for approx 2 months. Patient had a  RT partial cuboid resection on 08/27/19 for OM of RT cuobid that failes conservative management. Per Operative note, the resection margins were clear. However, I did not see any path sent from the clean margin. He denies taking any oral or IV abx after the procedure. He started following with wound care after  post procedure. He was started on 10 days of Bactrim for concern of infection at the surgical site  On 10/29/19 followed by Doxycycline on 11/12/19 for new wound that developed over the cuboid and has been taking Doxycyline until this clinic visit. 8/27 superficial wound cultures negative He does not have any recent imagings of his foot.   He is here with his wife. He got very upset when I discussed about seeing a podiatrist to him. He said " why don't you understand, they will take off my foot? I really want to save my foot with the IV abx". I explained that I need some deep bone cultures to help me guide with the antibiotics. He was frustrated and wanted to start IV abx right away. He did have good pulses on both feet but has severe neuropathy. He has quit smoking since 1980s.  Wound has started approx 2 months ago in July and has failed to heal despite being on Oral Doxycycline. He denies any fever, chills but complains of drainage from the wound which has been more frequent in the last 2 weeks. He has been taking Doxycycline without any issues.  ROS: Constitutional: Negative for fever, chills, activity change, appetite change, fatigue and unexpected weight change.   HENT: Negative for congestion, sore throat, rhinorrhea, sneezing, trouble swallowing and sinus pressure.  Eyes: Negative for photophobia and visual disturbance.  Respiratory: Negative for cough, chest tightness, shortness of breath, wheezing and stridor.  Cardiovascular: Negative for chest pain, palpitations and leg swelling.  Gastrointestinal: Negative for nausea, vomiting, abdominal pain, diarrhea, constipation, blood in stool, abdominal distention and anal bleeding.  Genitourinary: Negative for dysuria, hematuria, flank pain and difficulty urinating.  Musculoskeletal: Negative for myalgias, back pain, joint swelling, arthralgias Skin: Negative for color change, pallor, rash and wound.  Neurological: Negative for dizziness, tremors, weakness and light-headedness.  Hematological: Negative for adenopathy. Does not bruise/bleed easily.  Psychiatric/Behavioral: Negative for behavioral problems, confusion, sleep disturbance, dysphoric mood, decreased concentration and agitation.   Past Medical History:  Diagnosis Date  . Ambulates with cane    straight cane  . Cervical myelopathy ([place]) 02/06/2018  . Chronic kidney disease    dailysis M W F- home  . Coronary artery disease   . Diabetes mellitus without complication ([place])    type2  . Diabetic foot ulcer ([place])   . Diabetic foot ulcer ([place])   . Diabetic neuropathy ([place]) 02/06/2018  . Gait abnormality 08/24/2018  . GERD (gastroesophageal reflux disease)   . Gout   . History of kidney stones    passed stones  . Hypercholesteremia   . Hypertension   . Hypothyroidism   . Neuromuscular disorder (H[facility])    neuropathy left leg and bilateral feet  . Neuropathy   . Prostate cancer [facility])    Past Surgical History:  Procedure Laterality Date  . AMPUTATION Left 12/25/2013   Procedure: AMPUTATION RAY LEFT 5TH RAY;  Surgeon: [name], MD;  Location: [facility];  Service: Orthopedics;  Laterality: Left;  . AMPUTATION Right 12/15/2018   Procedure:  AMPUTATION OF 4TH AND 5TH TOES RIGHT FOOT;  Surgeon: [name], MD;  Location: [facility];  Service: Orthopedics;  Laterality: Right;  . APPLICATION OF WOUND VAC Right 12/15/2018   Procedure: APPLICATION OF WOUND VAC;  Surgeon: [name], MD;  Location: [place];  Service: Orthopedics;  Laterality: Right;  . AV FISTULA PLACEMENT Left 03/25/2019   Procedure: LEFT ARM ARTERIOVENOUS (AV) FISTULA CREATION;  Surgeon: Harold Barban  W, MD;  Location: [place];  Service: Vascular;  Laterality: Left;  . BACK SURGERY    . BASCILIC VEIN TRANSPOSITION Left 05/20/2019   Procedure: SECOND STAGE LEFT BASCILIC VEIN TRANSPOSITION;  Surgeon: [name], MD;  Location: [place];  Service: Vascular;  Laterality: Left;  . CARDIAC CATHETERIZATION  02/17/2014  . CHOLECYSTECTOMY    . COLONOSCOPY  2011   in [place], [place] GRAFT  2008  . I & D EXTREMITY Right 12/15/2018   Procedure: DEBRIDEMENT RIGHT FOOT;  Surgeon: [name], MD;  Location: [facility] [place];  Service: Orthopedics;  Laterality: Right;  . I & D EXTREMITY Right 08/27/2019   Procedure: PARTIAL CUBOID EXCISION RIGHT FOOT;  Surgeon: [name], MD;  Location: [facility];  Service: Orthopedics;  Laterality: Right;  . NECK SURGERY     novemver 2019  . WISDOM TOOTH EXTRACTION     Current Outpatient Medications on File Prior to Visit  Medication Sig Dispense Refill  . allopurinol (ZYLOPRIM) 100 MG tablet Take 200 mg by mouth daily.     Marland Kitchen aspirin EC 81 MG tablet Take 81 mg by mouth every morning.     . calcitRIOL (ROCALTROL) 0.5 MCG capsule Take 0.5 mcg by mouth 3 (three) times a week.    . diclofenac Sodium (VOLTAREN) 1 % GEL Apply 1 application topically daily as needed (pain).     Marland Kitchen doxycycline (VIBRA-TABS) 100 MG tablet TAKE 1 TABLET BY MOUTH TWICE A DAY 60 tablet 0  . doxycycline (VIBRA-TABS) 100 MG tablet Take 1 tablet (100 mg total) by mouth 2 (two) times daily. 60 tablet 0  . ferric citrate (AURYXIA) 1 GM 210 MG(Fe) tablet Take  420 mg by mouth 3 (three) times daily with meals.    . insulin degludec (TRESIBA FLEXTOUCH) 200 UNIT/ML FlexTouch Pen Inject 20 Units into the skin at bedtime.     Marland Kitchen levothyroxine (SYNTHROID, LEVOTHROID) 112 MCG tablet Take 112 mcg by mouth daily before breakfast. BRAND NAME SYNTHROID    . multivitamin (RENA-VIT) TABS tablet Take 1 tablet by mouth daily.     . pregabalin (LYRICA) 150 MG capsule Take 150 mg by mouth in the morning and at bedtime.     . rosuvastatin (CRESTOR) 10 MG tablet Take 10 mg by mouth daily.     . Semaglutide, 1 MG/DOSE, (OZEMPIC, 1 MG/DOSE,) 2 MG/1.5ML SOPN Inject 0.5 mg into the skin every Friday. Takes in the evening    . benzonatate (TESSALON) 200 MG capsule Take 1 capsule (200 mg total) by mouth 3 (three) times daily as needed for cough. (Patient not taking: Reported on 12/27/2019) 45 capsule 1  . famotidine (PEPCID) 20 MG tablet One after supper (Patient not taking: Reported on 12/27/2019) 30 tablet 11  . oxyCODONE-acetaminophen (PERCOCET) 5-325 MG tablet Take 1 tablet by mouth every 4 (four) hours as needed. (Patient not taking: Reported on 12/27/2019) 30 tablet 0  . pantoprazole (PROTONIX) 40 MG tablet TAKE 1 TABLET (40 MG TOTAL) BY MOUTH DAILY. TAKE 30-60 MIN BEFORE FIRST MEAL OF THE DAY (Patient not taking: Reported on 12/27/2019) 90 tablet 0   No current facility-administered medications on file prior to visit.   Allergies  Allergen Reactions  . Mushroom Extract Complex Nausea Only   Social History   Socioeconomic History  . Marital status: Married    Spouse name: Not on file  . Number of children: 2  . Years of education: Not on file  .  Highest education level: Not on file  Occupational History  . Not on file  Tobacco Use  . Smoking status: Former Smoker    Types: Cigars    Quit date: 12/24/1988    Years since quitting: 31.0  . Smokeless tobacco: Never Used  Vaping Use  . Vaping Use: Never used  Substance and Sexual Activity  . Alcohol use: No  .  Drug use: No  . Sexual activity: Yes    Partners: Female  Other Topics Concern  . Not on file  Social History Narrative   Regular exercise: yes 3 times a week   Caffeine use: hot tea   Social Determinants of Health   Financial Resource Strain:   . Difficulty of Paying Living Expenses: Not on file  Food Insecurity:   . Worried About Charity fundraiser in the Last Year: Not on file  . Ran Out of Food in the Last Year: Not on file  Transportation Needs:   . Lack of Transportation (Medical): Not on file  . Lack of Transportation (Non-Medical): Not on file  Physical Activity:   . Days of Exercise per Week: Not on file  . Minutes of Exercise per Session: Not on file  Stress:   . Feeling of Stress : Not on file  Social Connections:   . Frequency of Communication with Friends and Family: Not on file  . Frequency of Social Gatherings with Friends and Family: Not on file  . Attends Religious Services: Not on file  . Active Member of Clubs or Organizations: Not on file  . Attends Archivist Meetings: Not on file  . Marital Status: Not on file  Intimate Partner Violence:   . Fear of Current or Ex-Partner: Not on file  . Emotionally Abused: Not on file  . Physically Abused: Not on file  . Sexually Abused: Not on file     Vitals BP (!) 163/76   Pulse 74   Temp 97.6 F (36.4 C) (Oral)   Wt 208 lb (94.3 kg)   BMI 29.84 kg/m    Examination  General - not in acute distress, comfortably sitting in chair HEENT - PEERLA, no pallor and no icterus Chest - b/l clear air entry, no additional sounds CVS- Normal s1s2, RRR Abdomen - Soft, Non tender , non distended RT foot-    Neuro: grossly normal Back - WNL Psych : calm and cooperative   Recent labs   CBC Latest Ref Rng & Units 08/27/2019 06/25/2019 05/20/2019  WBC 3.8 - 10.8 Thousand/uL - 6.9 -  Hemoglobin 13.0 - 17.0 g/dL 7.8(L) 10.3(L) 9.5(L)  Hematocrit 39 - 52 % 23.0(L) 30.6(L) 28.0(L)  Platelets 140 - 400  Thousand/uL - 220 -   CMP Latest Ref Rng & Units 08/27/2019 05/20/2019 03/25/2019  Glucose 70 - 99 mg/dL 144(H) 129(H) 114(H)  BUN 8 - 23 mg/dL 44(H) 32(H) 74(H)  Creatinine 0.61 - 1.24 mg/dL 8.80(H) 5.60(H) 4.60(H)  Sodium 135 - 145 mmol/L 136 137 142  Potassium 3.5 - 5.1 mmol/L 3.7 3.4(L) 4.8  Chloride 98 - 111 mmol/L 96(L) 95(L) 112(H)  CO2 22 - 32 mmol/L - - -  Calcium 8.9 - 10.3 mg/dL - - -  Total Protein 6.5 - 8.1 g/dL - - -  Total Bilirubin 0.3 - 1.2 mg/dL - - -  Alkaline Phos 38 - 126 U/L - - -  AST 15 - 41 U/L - - -  ALT 0 - 44 U/L - - -  Pertinent Microbiology 12/03/19 Rt Foot wound cx no organisms om   Pertinent Imaging Xray Rt Foot 12/27/19  FINDINGS: Prior amputations of the fourth and fifth metatarsals. Evidence for soft tissue swelling and lucency along the lateral aspect of the foot near the tarsal bones. Mild cortical irregularity adjacent to the soft tissue swelling. Prominent enthesopathic changes at the Achilles tendon insertion site. Vascular calcifications along the dorsal aspect of the foot.  IMPRESSION: Soft tissue swelling and wound along the lateral aspect of the right foot adjacent to the tarsal bones. Mild cortical irregularity in the region of the soft tissue swelling. Findings raise concern for osteomyelitis.  Previous amputations as described.  All pertinent labs/Imagings/notes reviewed. All pertinent plain films and CT images have been personally visualized and interpreted; radiology reports have been reviewed. Decision making incorporated into the Impression / Recommendations.

## 2019-12-27 NOTE — Telephone Encounter (Addendum)
RN relayed verbal order for vancomycin and ceftazidime after hemodialysis. Patient does home hemodialysis 4 times a week (need to know which days and through which dialysis location) Will need weekly cbc, cmp, esr, crp, vanc trough - drawn by nursing or patient?   May need first dose at short stay if not had cef previously  [facility] will speak with Pam to see if this is something they can accommodate

## 2019-12-28 LAB — CBC
HCT: 33.9 % — ABNORMAL LOW (ref 38.5–50.0)
Hemoglobin: 10.8 g/dL — ABNORMAL LOW (ref 13.2–17.1)
MCH: 28.1 pg (ref 27.0–33.0)
MCHC: 31.9 g/dL — ABNORMAL LOW (ref 32.0–36.0)
MCV: 88.1 fL (ref 80.0–100.0)
MPV: 11 fL (ref 7.5–12.5)
Platelets: 180 10*3/uL (ref 140–400)
RBC: 3.85 10*6/uL — ABNORMAL LOW (ref 4.20–5.80)
RDW: 15.7 % — ABNORMAL HIGH (ref 11.0–15.0)
WBC: 6 10*3/uL (ref 3.8–10.8)

## 2019-12-28 LAB — C-REACTIVE PROTEIN: CRP: 77.9 mg/L — ABNORMAL HIGH (ref <8.0)

## 2019-12-28 LAB — SEDIMENTATION RATE: Sed Rate: 55 mm/h — ABNORMAL HIGH (ref 0–20)

## 2019-12-28 NOTE — Telephone Encounter (Signed)
Per [name] at [facility], patient will not be able to infuse antibiotics at home via fistula.  He would need to come into the dialysis center for dialysis followed by antibiotic infusion. Spoke with [name], dialysis nurse.  She confirmed switch from home (4 days) to on-site (3 days) through nephrologist Dr Hollie Salk, location and schedule pending. [name] dialysis - [phone] Relayed verbal orders to [place], [place] per Dr [place] Bali for  - Vancomycin 2 gm x 1 for loading dose, then 1 gm there after post dialysis x 6 weeks. - Ceftazidime 2 gm post dialysis x 6 weeks  Dialysis center unable to draw ESR, CRP.  They do draw monthly labs and will send to [facility] ([name] panel including CBC diff and BMP among others).  Please advise on labs.

## 2019-12-28 NOTE — Telephone Encounter (Signed)
I would prefer to have weekly CBC, CMP, ESR and CRP done. However, [name] just informed me that the HD cannot do ESR, CRP ( I am not sure why). In that case, I will see him in 2 weeks and get ESR and CRP at that time and another set of ESR and CRP near the end of his therapy.

## 2019-12-29 ENCOUNTER — Other Ambulatory Visit: Payer: Self-pay | Admitting: Infectious Diseases

## 2019-12-29 ENCOUNTER — Telehealth: Payer: Self-pay

## 2019-12-29 MED ORDER — METRONIDAZOLE 500 MG PO TABS
500.0000 mg | ORAL_TABLET | Freq: Two times a day (BID) | ORAL | 1 refills | Status: DC
Start: 2019-12-29 — End: 2020-01-07

## 2019-12-29 NOTE — Telephone Encounter (Signed)
Patient called office back stating he would prefer to do home dialysis then go to dialysis center. States he is willing to hire someone to do this, but would like to make sure MD is okay with this.  Per Rn's last note patient home health unable to infuse via fistula. Will forward message to MD.  Aundria Rud, [place]

## 2019-12-29 NOTE — Telephone Encounter (Signed)
-----   Message from Rosiland Oz, MD sent at 12/29/2019  7:55 AM EDT ----- Regarding: Update Could you please let the patient know that he can stop taking the current oral antibiotics as soon as he starts getting the IV abx? Once he starts getting the IV abx, he needs to take Metronidazole 500mg  PO BID for [redacted] weeks along with the IV abx. I have sent a prescription to his pharmacy. Thank you.

## 2019-12-29 NOTE — Telephone Encounter (Signed)
Spoke with patient regarding Md's message. Patient is okay with plan; will pick up new medication today. Is scheduled to start IV infusion today.  [place]

## 2019-12-29 NOTE — Telephone Encounter (Signed)
I think it would be more safe and preferable for the patient to have HD at the HD center and get the antibiotics after HD than at home. After 6 weeks, he can continue doing his home HD.

## 2019-12-29 NOTE — Telephone Encounter (Signed)
Thank you :)

## 2019-12-30 NOTE — Telephone Encounter (Signed)
Called patient with MD's message.  Patient has decided to have infusion at HD center. Is scheduled to see Podiatrist in one week. Will call office if he has any concerns.  [place]

## 2019-12-31 ENCOUNTER — Ambulatory Visit: Payer: Medicare Other | Admitting: Family

## 2019-12-31 NOTE — Telephone Encounter (Signed)
Notified that patient is receiving IV antibiotics at the dialysis center.

## 2019-12-31 NOTE — Telephone Encounter (Signed)
Sounds good. Thank you

## 2020-01-01 ENCOUNTER — Other Ambulatory Visit: Payer: Self-pay | Admitting: Orthopedic Surgery

## 2020-01-03 ENCOUNTER — Ambulatory Visit: Payer: Self-pay

## 2020-01-03 ENCOUNTER — Ambulatory Visit (INDEPENDENT_AMBULATORY_CARE_PROVIDER_SITE_OTHER): Payer: Medicare Other | Admitting: Orthopedic Surgery

## 2020-01-03 ENCOUNTER — Encounter: Payer: Self-pay | Admitting: Orthopedic Surgery

## 2020-01-03 VITALS — Ht 70.0 in | Wt 208.0 lb

## 2020-01-03 DIAGNOSIS — M79671 Pain in right foot: Secondary | ICD-10-CM

## 2020-01-03 DIAGNOSIS — L97514 Non-pressure chronic ulcer of other part of right foot with necrosis of bone: Secondary | ICD-10-CM | POA: Diagnosis not present

## 2020-01-03 DIAGNOSIS — I6523 Occlusion and stenosis of bilateral carotid arteries: Secondary | ICD-10-CM

## 2020-01-03 NOTE — Progress Notes (Signed)
Office Visit Note   Patient: [name]           Date of Birth: 06/12/1950           MRN: [id number] Visit Date: 01/03/2020              Requested by: Lorenda Hatchet, FNP [address] [place],  [place] [address] PCP: Lorenda Hatchet, FNP  Chief Complaint  Patient presents with  . Right Foot - Pain    08/27/19 right foot partial cuboid excision       HPI: Patient is a 69 year old gentleman diabetic end-stage renal disease on dialysis Tuesday Thursday Saturday who is status post fourth and fifth ray amputations of the right foot.  Patient has a persistent ulcer he has been started on 3 antibiotics with infectious disease with 2 of the antibiotics during dialysis.  Radiographs obtained with infectious disease did show destructive bony changes.  Assessment & Plan: Visit Diagnoses:  1. Pain in right foot   2. Ulcerated, foot, right, with necrosis of bone ([place])     Plan: With patient's necrotic bony changes to the base of the fifth metatarsal and cuboid I do not feel a bone culture would be beneficial I think the best option is is wide excision of the cuboid and base of the fifth metatarsal with patient being off antibiotics prior to cultures and then restarting antibiotics once cultures are finalized.  Risks and benefits were discussed including risk of the wound not healing risk of recurrent infection.  Patient states he understands and wishes to proceed with foot salvage intervention and does not want to consider a transtibial amputation.  Patient has commitments on Thursday and we will plan for surgery on Friday hold the antibiotics until surgery.  Follow-Up Instructions: Return in about 1 week (around 01/10/2020) for Follow-up a week from Thursday.   Ortho Exam  Patient is alert, oriented, no adenopathy, well-dressed, normal affect, normal respiratory effort. Examination patient has a good dorsalis pedis pulse patient has callus over the lateral base of the  fifth metatarsal.  The callus was pared there is good healthy granulation tissue patient has about 1 mm of exposed bone.  Review of radiographs obtained today and his most recent radiographs obtained infectious disease shows extensive bony abnormalities to the base of the fifth metatarsal extending into the cuboid.  Imaging: XR Foot 2 Views Right  Result Date: 01/03/2020 2 view radiographs of the right foot shows extensive destructive bony changes through the base of the fifth metatarsal extending to the cuboid.  No images are attached to the encounter.  Labs: Lab Results  Component Value Date   HGBA1C 6.3 (H) 12/14/2018   HGBA1C 6.0 (H) 12/24/2015   HGBA1C 7.0 (H) 12/25/2013   ESRSEDRATE 55 (H) 12/27/2019   ESRSEDRATE 131 (H) 12/14/2018   ESRSEDRATE 16 02/06/2018   CRP 77.9 (H) 12/27/2019   CRP 32.1 (H) 12/14/2018   REPTSTATUS 12/18/2018 FINAL 12/15/2018   GRAMSTAIN  12/15/2018    FEW WBC PRESENT,BOTH PMN AND MONONUCLEAR FEW GRAM POSITIVE COCCI FEW GRAM NEGATIVE RODS RARE GRAM POSITIVE RODS    CULT  12/15/2018    FEW ESCHERICHIA COLI FEW CORYNEBACTERIUM STRIATUM FEW PREVOTELLA MELANINOGENICA BETA LACTAMASE POSITIVE Performed at [facility], [place] [address] [place],  [address]    LABORGA ESCHERICHIA COLI 12/15/2018     Lab Results  Component Value Date   ALBUMIN 3.0 (L) 12/14/2018   ALBUMIN 3.7 12/24/2015  ALBUMIN 3.7 12/28/2014   PREALBUMIN 14.8 (L) 12/14/2018    No results found for: MG No results found for: VD25OH  Lab Results  Component Value Date   PREALBUMIN 14.8 (L) 12/14/2018   CBC EXTENDED Latest Ref Rng & Units 12/27/2019 08/27/2019 06/25/2019  WBC 3.8 - 10.8 Thousand/uL 6.0 - 6.9  RBC 4.20 - 5.80 Million/uL 3.85(L) - 3.57(L)  HGB 13.2 - 17.1 g/dL 10.8(L) 7.8(L) 10.3(L)  HCT 38 - 50 % 33.9(L) 23.0(L) 30.6(L)  PLT 140 - 400 Thousand/uL 180 - 220  NEUTROABS 1,500 - 7,800 cells/uL - - 5,223  LYMPHSABS 850 - 3,900 cells/uL - - 780(L)      Body mass index is 29.84 kg/m.  Orders:  Orders Placed This Encounter  Procedures  . XR Foot 2 Views Right   No orders of the defined types were placed in this encounter.    Procedures: No procedures performed  Clinical Data: No additional findings.  ROS:  All other systems negative, except as noted in the HPI. Review of Systems  Objective: Vital Signs: Ht 5\' 10"  (1.778 m)   Wt 208 lb (94.3 kg)   BMI 29.84 kg/m   Specialty Comments:  No specialty comments available.  PMFS History: Patient Active Problem List   Diagnosis Date Noted  . History of partial ray amputation of fourth toe of right foot ([place]) 09/08/2019  . Ulcerated, foot, right, with necrosis of bone ([place])   . Chronic cough 06/25/2019  . Cutaneous abscess of right foot   . Subacute osteomyelitis, right ankle and foot ([place]) 12/14/2018  . AKI (acute kidney injury) ([place]) 12/14/2018  . CKD (chronic kidney disease) stage 4, GFR 15-29 ml/min ([facility]) 12/14/2018  . Gait abnormality 08/24/2018  . Diabetic neuropathy ([place]) 02/06/2018  . Cervical myelopathy ([place]) 02/06/2018  . Onychomycosis 10/30/2017  . Other spondylosis with radiculopathy, cervical region 01/27/2017  . Midfoot ulcer, right, limited to breakdown of skin ([place]) 11/15/2016  . Lateral epicondylitis, left elbow 08/12/2016  . Cellulitis of fifth toe of right foot 07/29/2016  . Prostate cancer ([place]) 06/19/2016  . Non-pressure chronic ulcer of other part of right foot limited to breakdown of skin ([place]) 04/04/2016  . Cellulitis of leg, right 12/24/2015  . Cellulitis of right lower extremity 12/24/2015  . CKD (chronic kidney disease), stage III 12/25/2013  . Foot infection 12/24/2013  . Foot ulcer, left ([place]) 12/24/2013  . Diabetic foot ulcer ([place]) 09/14/2012  . Gout 09/14/2012  . HTN (hypertension) 09/14/2012  . Hypothyroidism 09/14/2012  . CAD (coronary artery disease) 09/14/2012  . Diabetes mellitus ([place]) 09/14/2012  . Hypercholesteremia    . Neuropathy [facility])    Past Medical History:  Diagnosis Date  . Ambulates with cane    straight cane  . Cervical myelopathy ([place]) 02/06/2018  . Chronic kidney disease    dailysis M W F- home  . Coronary artery disease   . Diabetes mellitus without complication ([place])    type2  . Diabetic foot ulcer ([place])   . Diabetic foot ulcer ([place])   . Diabetic neuropathy ([place]) 02/06/2018  . Gait abnormality 08/24/2018  . GERD (gastroesophageal reflux disease)   . Gout   . History of kidney stones    passed stones  . Hypercholesteremia   . Hypertension   . Hypothyroidism   . Neuromuscular disorder ([facility])    neuropathy left leg and bilateral feet  . Neuropathy   . Prostate cancer [facility])     Family History  Problem Relation Age of  Onset  . Diabetes Mellitus II Mother   . Diabetes Mellitus II Father   . CAD Father   . Cancer Father        prostate  . Diabetes Mellitus II Brother   . Diabetes Mellitus II Brother   . Stomach cancer Brother 78  . Colon cancer Neg Hx   . Colon polyps Neg Hx   . Esophageal cancer Neg Hx   . Rectal cancer Neg Hx     Past Surgical History:  Procedure Laterality Date  . AMPUTATION Left 12/25/2013   Procedure: AMPUTATION RAY LEFT 5TH RAY;  Surgeon: [name], MD;  [facility];  Service: Orthopedics;  Laterality: Left;  . AMPUTATION Right 12/15/2018   Procedure: AMPUTATION OF 4TH AND 5TH TOES RIGHT FOOT;  Surgeon: [name], MD;  Location: [place];  Service: Orthopedics;  Laterality: Right;  . APPLICATION OF WOUND VAC Right 12/15/2018   Procedure: APPLICATION OF WOUND VAC;  Surgeon: [name], MD;  Location: [place];  Service: Orthopedics;  Laterality: Right;  . AV FISTULA PLACEMENT Left 03/25/2019   Procedure: LEFT ARM ARTERIOVENOUS (AV) FISTULA CREATION;  Surgeon: [name], MD;  Location: [place];  Service: Vascular;  Laterality: Left;  . BACK SURGERY    . BASCILIC VEIN TRANSPOSITION Left 05/20/2019   Procedure: SECOND STAGE LEFT BASCILIC VEIN  TRANSPOSITION;  Surgeon: [name], MD;  Location: [place];  Service: Vascular;  Laterality: Left;  . CARDIAC CATHETERIZATION  02/17/2014  . CHOLECYSTECTOMY    . COLONOSCOPY  2011   in [place], [place] GRAFT  2008  . I & D EXTREMITY Right 12/15/2018   Procedure: DEBRIDEMENT RIGHT FOOT;  Surgeon: [name], MD;  Location: [place];  Service: Orthopedics;  Laterality: Right;  . I & D EXTREMITY Right 08/27/2019   Procedure: PARTIAL CUBOID EXCISION RIGHT FOOT;  Surgeon: [name], MD;  Location: [place];  Service: Orthopedics;  Laterality: Right;  . NECK SURGERY     novemver 2019  . WISDOM TOOTH EXTRACTION     Social History   Occupational History  . Not on file  Tobacco Use  . Smoking status: Former Smoker    Types: Cigars    Quit date: 12/24/1988    Years since quitting: 31.0  . Smokeless tobacco: Never Used  Vaping Use  . Vaping Use: Never used  Substance and Sexual Activity  . Alcohol use: No  . Drug use: No  . Sexual activity: Yes    Partners: Female

## 2020-01-05 ENCOUNTER — Other Ambulatory Visit: Payer: Self-pay | Admitting: Physician Assistant

## 2020-01-06 ENCOUNTER — Encounter (HOSPITAL_COMMUNITY): Payer: Self-pay | Admitting: Orthopedic Surgery

## 2020-01-06 ENCOUNTER — Other Ambulatory Visit: Payer: Self-pay

## 2020-01-06 NOTE — Progress Notes (Signed)
[name] denies chest pain or shortness of breath. Patient has had  3 vaccines for Covid.  [name] will be tested on arrival to the hospital. [name] has type II diabetes.  I instructed patient to take 5 units of Tresiba at hs. Check CBG upon awakening and every 2 hours until leaving to come to the hospital. If CBG is  less than 70; treat with 4 oz of a clear juice..  Recheck CBG in 15 minutes and call pre op desk - [phone] for further instructions.

## 2020-01-07 ENCOUNTER — Ambulatory Visit: Payer: BLUE CROSS/BLUE SHIELD | Admitting: Podiatry

## 2020-01-07 ENCOUNTER — Encounter (HOSPITAL_COMMUNITY): Payer: Self-pay | Admitting: Orthopedic Surgery

## 2020-01-07 ENCOUNTER — Ambulatory Visit (HOSPITAL_COMMUNITY): Payer: Medicare Other | Admitting: Anesthesiology

## 2020-01-07 ENCOUNTER — Ambulatory Visit (HOSPITAL_COMMUNITY)
Admission: RE | Admit: 2020-01-07 | Discharge: 2020-01-07 | Disposition: A | Discharge status: Home / Self Care | Payer: Medicare Other | Source: Ambulatory Visit | Attending: Orthopedic Surgery | Admitting: Orthopedic Surgery

## 2020-01-07 ENCOUNTER — Encounter (HOSPITAL_COMMUNITY)
Admission: RE | Disposition: A | Discharge status: Home / Self Care | Payer: Self-pay | Source: Ambulatory Visit | Attending: Orthopedic Surgery

## 2020-01-07 ENCOUNTER — Other Ambulatory Visit: Payer: Self-pay

## 2020-01-07 DIAGNOSIS — Z20822 Contact with and (suspected) exposure to covid-19: Secondary | ICD-10-CM | POA: Insufficient documentation

## 2020-01-07 DIAGNOSIS — E1122 Type 2 diabetes mellitus with diabetic chronic kidney disease: Secondary | ICD-10-CM | POA: Insufficient documentation

## 2020-01-07 DIAGNOSIS — N186 End stage renal disease: Secondary | ICD-10-CM | POA: Insufficient documentation

## 2020-01-07 DIAGNOSIS — L97514 Non-pressure chronic ulcer of other part of right foot with necrosis of bone: Secondary | ICD-10-CM | POA: Insufficient documentation

## 2020-01-07 DIAGNOSIS — Z8546 Personal history of malignant neoplasm of prostate: Secondary | ICD-10-CM | POA: Diagnosis not present

## 2020-01-07 DIAGNOSIS — D631 Anemia in chronic kidney disease: Secondary | ICD-10-CM | POA: Insufficient documentation

## 2020-01-07 DIAGNOSIS — M86271 Subacute osteomyelitis, right ankle and foot: Secondary | ICD-10-CM

## 2020-01-07 DIAGNOSIS — Z89421 Acquired absence of other right toe(s): Secondary | ICD-10-CM | POA: Insufficient documentation

## 2020-01-07 DIAGNOSIS — E1169 Type 2 diabetes mellitus with other specified complication: Secondary | ICD-10-CM | POA: Insufficient documentation

## 2020-01-07 DIAGNOSIS — Z951 Presence of aortocoronary bypass graft: Secondary | ICD-10-CM | POA: Diagnosis not present

## 2020-01-07 DIAGNOSIS — I120 Hypertensive chronic kidney disease with stage 5 chronic kidney disease or end stage renal disease: Secondary | ICD-10-CM | POA: Insufficient documentation

## 2020-01-07 DIAGNOSIS — E11622 Type 2 diabetes mellitus with other skin ulcer: Secondary | ICD-10-CM | POA: Diagnosis not present

## 2020-01-07 DIAGNOSIS — Z87891 Personal history of nicotine dependence: Secondary | ICD-10-CM | POA: Diagnosis not present

## 2020-01-07 DIAGNOSIS — Z7984 Long term (current) use of oral hypoglycemic drugs: Secondary | ICD-10-CM | POA: Diagnosis not present

## 2020-01-07 DIAGNOSIS — Z89432 Acquired absence of left foot: Secondary | ICD-10-CM | POA: Diagnosis not present

## 2020-01-07 DIAGNOSIS — I2510 Atherosclerotic heart disease of native coronary artery without angina pectoris: Secondary | ICD-10-CM | POA: Insufficient documentation

## 2020-01-07 DIAGNOSIS — M869 Osteomyelitis, unspecified: Secondary | ICD-10-CM | POA: Diagnosis not present

## 2020-01-07 HISTORY — DX: Other specified postprocedural states: Z98.890

## 2020-01-07 HISTORY — DX: Other complications of anesthesia, initial encounter: T88.59XA

## 2020-01-07 HISTORY — PX: I & D EXTREMITY: SHX5045

## 2020-01-07 HISTORY — DX: Nausea with vomiting, unspecified: R11.2

## 2020-01-07 LAB — SURGICAL PCR SCREEN
MRSA, PCR: NEGATIVE
Staphylococcus aureus: NEGATIVE

## 2020-01-07 LAB — POCT I-STAT, CHEM 8
BUN: 33 mg/dL — ABNORMAL HIGH (ref 8–23)
Calcium, Ion: 1.02 mmol/L — ABNORMAL LOW (ref 1.15–1.40)
Chloride: 100 mmol/L (ref 98–111)
Creatinine, Ser: 5.7 mg/dL — ABNORMAL HIGH (ref 0.61–1.24)
Glucose, Bld: 93 mg/dL (ref 70–99)
HCT: 31 % — ABNORMAL LOW (ref 39.0–52.0)
Hemoglobin: 10.5 g/dL — ABNORMAL LOW (ref 13.0–17.0)
Potassium: 4.2 mmol/L (ref 3.5–5.1)
Sodium: 141 mmol/L (ref 135–145)
TCO2: 27 mmol/L (ref 22–32)

## 2020-01-07 LAB — GLUCOSE, CAPILLARY
Glucose-Capillary: 103 mg/dL — ABNORMAL HIGH (ref 70–99)
Glucose-Capillary: 82 mg/dL (ref 70–99)
Glucose-Capillary: 94 mg/dL (ref 70–99)

## 2020-01-07 LAB — SARS CORONAVIRUS 2 BY RT PCR (HOSPITAL ORDER, PERFORMED IN ~~LOC~~ HOSPITAL LAB): SARS Coronavirus 2: NEGATIVE

## 2020-01-07 SURGERY — IRRIGATION AND DEBRIDEMENT EXTREMITY
Anesthesia: General | Laterality: Right

## 2020-01-07 MED ORDER — POVIDONE-IODINE 10 % EX SWAB
2.0000 "application " | Freq: Once | CUTANEOUS | Status: AC
  Administered 2020-01-07: 2 via TOPICAL

## 2020-01-07 MED ORDER — FENTANYL CITRATE (PF) 250 MCG/5ML IJ SOLN
INTRAMUSCULAR | Status: DC | PRN
  Administered 2020-01-07: 25 ug via INTRAVENOUS

## 2020-01-07 MED ORDER — OXYCODONE HCL 5 MG PO TABS: 5.0000 mg | ORAL_TABLET | Freq: Once | ORAL | Status: DC | PRN

## 2020-01-07 MED ORDER — PROPOFOL 10 MG/ML IV BOLUS
INTRAVENOUS | Status: DC | PRN
  Administered 2020-01-07: 150 mg via INTRAVENOUS

## 2020-01-07 MED ORDER — ONDANSETRON HCL 4 MG/2ML IJ SOLN
INTRAMUSCULAR | Status: DC | PRN
  Administered 2020-01-07: 4 mg via INTRAVENOUS

## 2020-01-07 MED ORDER — CEFAZOLIN SODIUM-DEXTROSE 2-4 GM/100ML-% IV SOLN
2.0000 g | INTRAVENOUS | Status: AC
  Administered 2020-01-07: 2 g via INTRAVENOUS
  Filled 2020-01-07: qty 100

## 2020-01-07 MED ORDER — MIDAZOLAM HCL 2 MG/2ML IJ SOLN
INTRAMUSCULAR | Status: AC
  Filled 2020-01-07: qty 2

## 2020-01-07 MED ORDER — ONDANSETRON HCL 4 MG/2ML IJ SOLN: 4.0000 mg | Freq: Once | INTRAMUSCULAR | Status: DC | PRN

## 2020-01-07 MED ORDER — OXYCODONE-ACETAMINOPHEN 5-325 MG PO TABS: 1.0000 | ORAL_TABLET | ORAL | 0 refills | Status: DC | PRN

## 2020-01-07 MED ORDER — FENTANYL CITRATE (PF) 100 MCG/2ML IJ SOLN: 25.0000 ug | INTRAMUSCULAR | Status: DC | PRN

## 2020-01-07 MED ORDER — CHLORHEXIDINE GLUCONATE 0.12 % MT SOLN
15.0000 mL | Freq: Once | OROMUCOSAL | Status: AC
  Administered 2020-01-07: 15 mL via OROMUCOSAL
  Filled 2020-01-07: qty 15

## 2020-01-07 MED ORDER — CHLORHEXIDINE GLUCONATE 4 % EX LIQD: 60.0000 mL | Freq: Once | CUTANEOUS | Status: DC

## 2020-01-07 MED ORDER — EPHEDRINE 5 MG/ML INJ
INTRAVENOUS | Status: AC
  Filled 2020-01-07: qty 10

## 2020-01-07 MED ORDER — PROPOFOL 10 MG/ML IV BOLUS
INTRAVENOUS | Status: AC
  Filled 2020-01-07: qty 20

## 2020-01-07 MED ORDER — 0.9 % SODIUM CHLORIDE (POUR BTL) OPTIME
TOPICAL | Status: DC | PRN
  Administered 2020-01-07: 1000 mL

## 2020-01-07 MED ORDER — DEXAMETHASONE SODIUM PHOSPHATE 4 MG/ML IJ SOLN
INTRAMUSCULAR | Status: DC | PRN
  Administered 2020-01-07: 5 mg via INTRAVENOUS

## 2020-01-07 MED ORDER — FENTANYL CITRATE (PF) 250 MCG/5ML IJ SOLN
INTRAMUSCULAR | Status: AC
  Filled 2020-01-07: qty 5

## 2020-01-07 MED ORDER — OXYCODONE HCL 5 MG/5ML PO SOLN: 5.0000 mg | Freq: Once | ORAL | Status: DC | PRN

## 2020-01-07 MED ORDER — EPHEDRINE SULFATE-NACL 50-0.9 MG/10ML-% IV SOSY
PREFILLED_SYRINGE | INTRAVENOUS | Status: DC | PRN
  Administered 2020-01-07: 5 mg via INTRAVENOUS
  Administered 2020-01-07: 10 mg via INTRAVENOUS

## 2020-01-07 MED ORDER — MIDAZOLAM HCL 5 MG/5ML IJ SOLN
INTRAMUSCULAR | Status: DC | PRN
  Administered 2020-01-07: 1 mg via INTRAVENOUS

## 2020-01-07 MED ORDER — SODIUM CHLORIDE 0.9 % IV SOLN: INTRAVENOUS | Status: DC

## 2020-01-07 MED ORDER — ORAL CARE MOUTH RINSE: 15.0000 mL | Freq: Once | OROMUCOSAL | Status: AC

## 2020-01-07 MED ORDER — LIDOCAINE 2% (20 MG/ML) 5 ML SYRINGE
INTRAMUSCULAR | Status: DC | PRN
  Administered 2020-01-07: 60 mg via INTRAVENOUS

## 2020-01-07 SURGICAL SUPPLY — 24 items
BLADE SAW SGTL 81X20 HD (BLADE) ×2 IMPLANT
BLADE SURG 21 STRL SS (BLADE) ×2 IMPLANT
BNDG GAUZE ELAST 4 BULKY (GAUZE/BANDAGES/DRESSINGS) ×4 IMPLANT
COVER SURGICAL LIGHT HANDLE (MISCELLANEOUS) ×2 IMPLANT
DRAPE DERMATAC (DRAPES) ×2 IMPLANT
DRAPE U-SHAPE 47X51 STRL (DRAPES) ×2 IMPLANT
DURAPREP 26ML APPLICATOR (WOUND CARE) ×2 IMPLANT
ELECT REM PT RETURN 9FT ADLT (ELECTROSURGICAL)
ELECTRODE REM PT RTRN 9FT ADLT (ELECTROSURGICAL) IMPLANT
GLOVE BIOGEL PI IND STRL 9 (GLOVE) ×1 IMPLANT
GLOVE BIOGEL PI INDICATOR 9 (GLOVE) ×1
GLOVE SURG ORTHO 9.0 STRL STRW (GLOVE) ×2 IMPLANT
GOWN STRL REUS W/ TWL XL LVL3 (GOWN DISPOSABLE) ×2 IMPLANT
GOWN STRL REUS W/TWL XL LVL3 (GOWN DISPOSABLE) ×2
KIT BASIN OR (CUSTOM PROCEDURE TRAY) ×2 IMPLANT
KIT PREVENA INCISION MGT 13 (CANNISTER) ×2 IMPLANT
KIT TURNOVER KIT B (KITS) ×2 IMPLANT
MANIFOLD NEPTUNE II (INSTRUMENTS) ×2 IMPLANT
NS IRRIG 1000ML POUR BTL (IV SOLUTION) ×2 IMPLANT
PACK ORTHO EXTREMITY (CUSTOM PROCEDURE TRAY) ×2 IMPLANT
SUT ETHILON 2 0 PSLX (SUTURE) ×4 IMPLANT
TOWEL GREEN STERILE (TOWEL DISPOSABLE) ×2 IMPLANT
TUBE CONNECTING 12X1/4 (SUCTIONS) ×2 IMPLANT
YANKAUER SUCT BULB TIP NO VENT (SUCTIONS) ×2 IMPLANT

## 2020-01-07 NOTE — Transfer of Care (Signed)
Immediate Anesthesia Transfer of Care Note  Patient: [name]  Procedure(s) Performed: RIGHT FOOT EXCISION INFECTED BONE (Right )  Patient Location: PACU  Anesthesia Type:General  Level of Consciousness: awake, alert  and oriented  Airway & Oxygen Therapy: Patient Spontanous Breathing  Post-op Assessment: Report given to RN and Post -op Vital signs reviewed and stable  Post vital signs: Reviewed and stable  Last Vitals:  Vitals Value Taken Time  BP 148/77 01/07/20 1334  Temp    Pulse 75 01/07/20 1335  Resp 16 01/07/20 1335  SpO2 98 % 01/07/20 1335  Vitals shown include unvalidated device data.  Last Pain:  Vitals:   01/07/20 1018  TempSrc: Oral         Complications: No complications documented.

## 2020-01-07 NOTE — Anesthesia Preprocedure Evaluation (Addendum)
Anesthesia Evaluation  Patient identified by MRN, date of birth, ID band Patient awake    Reviewed: Allergy & Precautions, NPO status , Patient's Chart, lab work & pertinent test results  History of Anesthesia Complications (+) PONV and history of anesthetic complications  Airway Mallampati: II  TM Distance: >3 FB Neck ROM: Full    Dental  (+) Dental Advisory Given, Teeth Intact   Pulmonary former smoker,    Pulmonary exam normal        Cardiovascular hypertension, Pt. on medications and Pt. on home beta blockers + CAD and + CABG  Normal cardiovascular exam   '21 Carotid US - Stenosis in b/l ICA (16-49%)   Neuro/Psych  Neuromuscular disease (cervical myelopathy, neuropathy) negative psych ROS   GI/Hepatic Neg liver ROS, GERD  Controlled,  Endo/Other  diabetes, Type 2, Oral Hypoglycemic AgentsHypothyroidism   Renal/GU ESRF and DialysisRenal disease     Musculoskeletal  (+) Arthritis ,  Gout    Abdominal   Peds  Hematology  (+) anemia ,   Anesthesia Other Findings Needs Covid test   Reproductive/Obstetrics                            Anesthesia Physical Anesthesia Plan  ASA: III  Anesthesia Plan: General   Post-op Pain Management:    Induction: Intravenous  PONV Risk Score and Plan: 3 and Treatment may vary due to age or medical condition, Ondansetron and Dexamethasone  Airway Management Planned: LMA  Additional Equipment: None  Intra-op Plan:   Post-operative Plan: Extubation in OR  Informed Consent: I have reviewed the patients History and Physical, chart, labs and discussed the procedure including the risks, benefits and alternatives for the proposed anesthesia with the patient or authorized representative who has indicated his/her understanding and acceptance.     Dental advisory given  Plan Discussed with: CRNA and Anesthesiologist  Anesthesia Plan Comments:         Anesthesia Quick Evaluation

## 2020-01-07 NOTE — Anesthesia Postprocedure Evaluation (Signed)
Anesthesia Post Note  Patient: [name]  Procedure(s) Performed: RIGHT FOOT EXCISION INFECTED BONE (Right )     Patient location during evaluation: PACU Anesthesia Type: General Level of consciousness: awake and alert Pain management: pain level controlled Vital Signs Assessment: post-procedure vital signs reviewed and stable Respiratory status: spontaneous breathing, nonlabored ventilation and respiratory function stable Cardiovascular status: blood pressure returned to baseline and stable Postop Assessment: no apparent nausea or vomiting Anesthetic complications: no   No complications documented.  Last Vitals:  Vitals:   01/07/20 1340 01/07/20 1351  BP: (!) 148/77   Pulse: 75   Resp: 15 16  Temp:  (!) 36.1 C  SpO2: 99% 100%    Last Pain:  Vitals:   01/07/20 1351  TempSrc:   PainSc: 0-No pain                 Audry Pili

## 2020-01-07 NOTE — Op Note (Signed)
01/07/2020  1:35 PM  PATIENT:  [name]    PRE-OPERATIVE DIAGNOSIS:  Osteomyelitis Right Foot  POST-OPERATIVE DIAGNOSIS:  Same  PROCEDURE:  RIGHT FOOT EXCISION INFECTED BONE Including excision of skin and soft tissue fascia tendon including bone of the cuboid and fourth metatarsal base Local tissue rearrangement for wound closure 10 x 4 cm. Application of Prevena wound VAC.  SURGEON:  [name], MD  PHYSICIAN ASSISTANT:None ANESTHESIA:   General  PREOPERATIVE INDICATIONS:  [name] is a  69 y.o. male with a diagnosis of Osteomyelitis Right Foot who failed conservative measures and elected for surgical management.    The risks benefits and alternatives were discussed with the patient preoperatively including but not limited to the risks of infection, bleeding, nerve injury, cardiopulmonary complications, the need for revision surgery, among others, and the patient was willing to proceed.  OPERATIVE IMPLANTS: 13 cm Prevena wound VAC  @ENCIMAGES @  OPERATIVE FINDINGS: Bone, soft tissue, tendon and skin sent for cultures  OPERATIVE PROCEDURE: Patient was brought to the operating room and underwent a general anesthetic.  After adequate level of anesthesia were obtained patient's right lower extremity was prepped using DuraPrep draped into the sterile field a timeout was called.  Elliptical incision was made longitudinally around the ulcerative tissue this left a wound that was 10 x 4 cm.  Bony resection was performed including partial resection the base of the fourth metatarsal partial resection of the cuboid with an oscillating saw.  The wound edges were healthy and viable.  The wound was irrigated with normal saline.  Electrocautery was used hemostasis.  Local tissue rearrangement was used to close the wound 10 x 4 cm.  A Prevena wound VAC was applied covered with Covan this had a good suction fit patient was extubated taken the PACU in stable condition   DISCHARGE  PLANNING:  Antibiotic duration: Preoperative antibiotics patient does have a follow-up with infectious disease and anticipate cultures may be finalized prior to his follow-up with infectious disease.  Weightbearing: Touchdown weightbearing on the right  Pain medication: Prescription for Percocet  Dressing care/ Wound VAC: Continue wound VAC for 1 week  Ambulatory devices: Walker  Discharge to: Home.  Follow-up: In the office 1 week post operative.

## 2020-01-07 NOTE — Anesthesia Procedure Notes (Signed)
Procedure Name: LMA Insertion Date/Time: 01/07/2020 1:01 PM Performed by: Dorthea Cove, CRNA Pre-anesthesia Checklist: Patient identified, Emergency Drugs available, Suction available and Patient being monitored Patient Re-evaluated:Patient Re-evaluated prior to induction Oxygen Delivery Method: Circle system utilized Preoxygenation: Pre-oxygenation with 100% oxygen Induction Type: IV induction Ventilation: Mask ventilation without difficulty LMA: LMA inserted LMA Size: 5.0 Tube type: Oral Number of attempts: 1 Airway Equipment and Method: Stylet and Oral airway Placement Confirmation: ETT inserted through vocal cords under direct vision,  positive ETCO2 and breath sounds checked- equal and bilateral Tube secured with: Tape Dental Injury: Teeth and Oropharynx as per pre-operative assessment

## 2020-01-07 NOTE — H&P (Signed)
[name] is an 69 y.o. male.   Chief Complaint: Right Foot Osteomyelitis HPI: Patient is a 69 year old gentleman diabetic end-stage renal disease on dialysis Tuesday Thursday Saturday who is status post fourth and fifth ray amputations of the right foot.  Patient has a persistent ulcer he has been started on 3 antibiotics with infectious disease with 2 of the antibiotics during dialysis.  Radiographs obtained with infectious disease did show destructive bony changes.  Past Medical History:  Diagnosis Date  . Ambulates with cane    straight cane  . Cervical myelopathy ([place]) 02/06/2018  . Chronic kidney disease    dailysis M W F- home  . Complication of anesthesia   . Coronary artery disease   . Diabetes mellitus without complication ([place])    type2  . Diabetic foot ulcer ([place])   . Diabetic foot ulcer ([place])   . Diabetic neuropathy ([place]) 02/06/2018  . Gait abnormality 08/24/2018  . GERD (gastroesophageal reflux disease)     01/06/20- not current  . Gout   . History of kidney stones    passed stones  . Hypercholesteremia   . Hypertension   . Hypothyroidism   . Neuromuscular disorder (HCC)    neuropathy left leg and bilateral feet  . Neuropathy   . PONV (postoperative nausea and vomiting)   . Prostate cancer [facility])     Past Surgical History:  Procedure Laterality Date  . AMPUTATION Left 12/25/2013   Procedure: AMPUTATION RAY LEFT 5TH RAY;  Surgeon: [name], MD;  [facility];  Service: Orthopedics;  Laterality: Left;  . AMPUTATION Right 12/15/2018   Procedure: AMPUTATION OF 4TH AND 5TH TOES RIGHT FOOT;  Surgeon: [name], MD;  Location: [place];  Service: Orthopedics;  Laterality: Right;  . APPLICATION OF WOUND VAC Right 12/15/2018   Procedure: APPLICATION OF WOUND VAC;  Surgeon: [name], MD;  Location: [place];  Service: Orthopedics;  Laterality: Right;  . AV FISTULA PLACEMENT Left 03/25/2019   Procedure: LEFT ARM ARTERIOVENOUS (AV) FISTULA CREATION;  Surgeon:  [name], MD;  Location: [place];  Service: Vascular;  Laterality: Left;  . BACK SURGERY    . BASCILIC VEIN TRANSPOSITION Left 05/20/2019   Procedure: SECOND STAGE LEFT BASCILIC VEIN TRANSPOSITION;  Surgeon: [name], MD;  Location: [place];  Service: Vascular;  Laterality: Left;  . CARDIAC CATHETERIZATION  02/17/2014  . CHOLECYSTECTOMY    . COLONOSCOPY  2011   in [place], [place] GRAFT  2008  . I & D EXTREMITY Right 12/15/2018   Procedure: DEBRIDEMENT RIGHT FOOT;  Surgeon: [name], MD;  Location: [place];  Service: Orthopedics;  Laterality: Right;  . I & D EXTREMITY Right 08/27/2019   Procedure: PARTIAL CUBOID EXCISION RIGHT FOOT;  Surgeon: [name], MD;  Location: [place];  Service: Orthopedics;  Laterality: Right;  . NECK SURGERY     novemver 2019  . WISDOM TOOTH EXTRACTION      Family History  Problem Relation Age of Onset  . Diabetes Mellitus II Mother   . Diabetes Mellitus II Father   . CAD Father   . Cancer Father        prostate  . Diabetes Mellitus II Brother   . Diabetes Mellitus II Brother   . Stomach cancer Brother 33  . Colon cancer Neg Hx   . Colon polyps Neg Hx   . Esophageal cancer Neg Hx   . Rectal  cancer Neg Hx    Social History:  reports that he quit smoking about 31 years ago. His smoking use included cigars. He has never used smokeless tobacco. He reports that he does not drink alcohol and does not use drugs.  Allergies:  Allergies  Allergen Reactions  . Mushroom Extract Complex Nausea Only    No medications prior to admission.    No results found for this or any previous visit (from the past 48 hour(s)). No results found.  Review of Systems  All other systems reviewed and are negative.   There were no vitals taken for this visit. Physical Exam  Patient is alert, oriented, no adenopathy, well-dressed, normal affect, normal respiratory effort. Examination patient has a good dorsalis pedis pulse  patient has callus over the lateral base of the fifth metatarsal.  The callus was pared there is good healthy granulation tissue patient has about 1 mm of exposed bone.  Review of radiographs obtained today and his most recent radiographs obtained infectious disease shows extensive bony abnormalities to the base of the fifth metatarsal extending into the cuboid.Heart RRR Lungs Clear Assessment/Plan 1. Pain in right foot   2. Ulcerated, foot, right, with necrosis of bone ([place])     Plan: With patient's necrotic bony changes to the base of the fifth metatarsal and cuboid I do not feel a bone culture would be beneficial I think the best option is is wide excision of the cuboid and base of the fifth metatarsal with patient being off antibiotics prior to cultures and then restarting antibiotics once cultures are finalized.  Risks and benefits were discussed including risk of the wound not healing risk of recurrent infection.  Patient states he understands and wishes to proceed with foot salvage intervention and does not want to consider a transtibial amputation.  Patient has commitments on Thursday and we will plan for surgery on Friday hold the antibiotics until surgery.   Bevely Palmer Lehua Flores, PA 01/07/2020, 6:53 AM

## 2020-01-08 ENCOUNTER — Encounter (HOSPITAL_COMMUNITY): Payer: Self-pay | Admitting: Orthopedic Surgery

## 2020-01-10 ENCOUNTER — Other Ambulatory Visit: Payer: Self-pay

## 2020-01-10 ENCOUNTER — Ambulatory Visit (INDEPENDENT_AMBULATORY_CARE_PROVIDER_SITE_OTHER): Payer: Medicare Other | Admitting: Infectious Diseases

## 2020-01-10 DIAGNOSIS — M869 Osteomyelitis, unspecified: Secondary | ICD-10-CM | POA: Diagnosis not present

## 2020-01-10 DIAGNOSIS — M86271 Subacute osteomyelitis, right ankle and foot: Secondary | ICD-10-CM

## 2020-01-10 NOTE — Progress Notes (Addendum)
[name]                                                             [place], [place], [place], [address]                                                                  Phn. [phone]; Fax: [phone]                                                                             Date: 01/10/2020  Reason for Visit: Follow up for Diabetic Foot Ulcer   Assessment Rt Foot Diabetic Ulcer/Rt Cuboid and 4th metatarsal base Osteomyelitis Diabetes mellitus with Diabetic Neuropathy  ESRD on HD  Patient was finally seen by Dr [name] and fortunately, he underwent Rt foot excisional debridement on 10/1. OR cx is growing Staphylococcus haemolyticus and Corynebacterium striatum. Path was not sent. However, OM of the remaining bone is high and will need to be treated for 6 weeks   Plan Vancomycin with HD * 6 weeks  Weekly CBC, BMP HD not able to do weekly CRP and ESR. So, I will do ESR and CRP 2 weekly during clinic visit. Follow up in 2 weeks   I spent greater than 25 minutes with the patient including greater than 50% of time in face to face counsel of the patient and in coordination of their care.    Rosiland Oz, MD [facility] for Infectious Diseases  Office phone [phone] Fax no. [phone] ______________________________________________________________________________________________________________________ Subjective  Patient is here for a follow up of Rt Foot Osteomyelitis. Patient was seen by Dr [name] and went excisional debridement on 10/1. His Rt foot is still wrapped with a surgical dressing and still has a wound vac. He is supposed to see Dr [name] soon for post op check. He has not been taking Vancomycin since last Tuesday 9/28. Will communicate with his HD center regarding starting Vancomycin with HD. Patient and wife are agreeable to the plan. No other complaints today    ROS: 11 point ROS negative except as stated above   Past Medical History:  Diagnosis Date  . Ambulates with cane    straight cane  . Cervical myelopathy ([place]) 02/06/2018  . Chronic kidney disease    dailysis M W F- home  . Complication of anesthesia   . Coronary artery disease   . Diabetes mellitus without complication ([place])    type2  . Diabetic foot ulcer ([place])   . Diabetic foot ulcer ([place])   . Diabetic neuropathy ([place]) 02/06/2018  . Gait abnormality 08/24/2018  . GERD (gastroesophageal reflux disease)     01/06/20- not current  . Gout   . History of kidney stones    passed stones  .  Hypercholesteremia   . Hypertension   . Hypothyroidism   . Neuromuscular disorder (HCC)    neuropathy left leg and bilateral feet  . Neuropathy   . PONV (postoperative nausea and vomiting)   . Prostate cancer [facility])    Past Surgical History:  Procedure Laterality Date  . AMPUTATION Left 12/25/2013   Procedure: AMPUTATION RAY LEFT 5TH RAY;  Surgeon: [name], MD;  [facility];  Service: Orthopedics;  Laterality: Left;  . AMPUTATION Right 12/15/2018   Procedure: AMPUTATION OF 4TH AND 5TH TOES RIGHT FOOT;  Surgeon: [name], MD;  Location: [place];  Service: Orthopedics;  Laterality: Right;  . APPLICATION OF WOUND VAC Right 12/15/2018   Procedure: APPLICATION OF WOUND VAC;  Surgeon: [name], MD;  Location: [place];  Service: Orthopedics;  Laterality: Right;  . AV FISTULA PLACEMENT Left 03/25/2019   Procedure: LEFT ARM ARTERIOVENOUS (AV) FISTULA CREATION;  Surgeon: [name], MD;  Location: [place];  Service: Vascular;  Laterality: Left;  . BACK SURGERY    . BASCILIC VEIN TRANSPOSITION Left 05/20/2019   Procedure: SECOND STAGE LEFT BASCILIC VEIN TRANSPOSITION;  Surgeon: [name], MD;  Location: [place];  Service: Vascular;  Laterality: Left;  . CARDIAC CATHETERIZATION  02/17/2014  . CHOLECYSTECTOMY    . COLONOSCOPY  2011   in [place], [place] GRAFT  2008  . I & D EXTREMITY Right 12/15/2018   Procedure: DEBRIDEMENT RIGHT FOOT;  Surgeon: [name], MD;  Location: [place];  Service: Orthopedics;  Laterality: Right;  . I & D EXTREMITY Right 08/27/2019   Procedure: PARTIAL CUBOID EXCISION RIGHT FOOT;  Surgeon: [name], MD;  Location: [place];  Service: Orthopedics;  Laterality: Right;  . I & D EXTREMITY Right 01/07/2020   Procedure: RIGHT FOOT EXCISION INFECTED BONE;  Surgeon: [name], MD;  Location: [place];  Service: Orthopedics;  Laterality: Right;  . NECK SURGERY     novemver 2019  . WISDOM TOOTH EXTRACTION     Current Outpatient Medications on File Prior to Visit  Medication Sig Dispense Refill  . allopurinol (ZYLOPRIM) 100 MG tablet Take 200 mg by mouth daily.     Marland Kitchen aspirin EC 81 MG tablet Take 81 mg by mouth every morning.     . B Complex-C-Zn-Folic Acid (DIALYVITE 237 WITH ZINC) 0.8 MG TABS Take 1 tablet by mouth daily.    . calcitRIOL (ROCALTROL) 0.5 MCG capsule Take 0.5 mcg by mouth 3 (three) times a week.    . carvedilol (COREG) 6.25 MG tablet Take 6.25 mg by mouth 2 (two) times daily.    . chlorhexidine (PERIDEX) 0.12 % solution 15 mLs by Mouth Rinse route daily.    . diclofenac Sodium (VOLTAREN) 1 % GEL Apply 1 application topically daily as needed (pain).     . Ibuprofen-Acetaminophen (ADVIL DUAL ACTION) 125-250 MG TABS Take 4 tablets by mouth daily as needed (Pain).    . insulin degludec (TRESIBA FLEXTOUCH) 200 UNIT/ML FlexTouch Pen Inject 10 Units into the skin at bedtime.     Marland Kitchen levothyroxine (SYNTHROID, LEVOTHROID) 112 MCG tablet Take 112 mcg by mouth daily before breakfast. BRAND NAME SYNTHROID    . oxyCODONE-acetaminophen (PERCOCET/ROXICET) 5-325 MG tablet Take 1 tablet by mouth every 4 (four) hours as needed. 30 tablet 0  . pregabalin (LYRICA) 150 MG capsule Take 150 mg by mouth in the morning and at bedtime.     Marland Kitchen  rosuvastatin (CRESTOR) 10 MG tablet Take 10 mg by mouth daily.     . Semaglutide, 1  MG/DOSE, (OZEMPIC, 1 MG/DOSE,) 2 MG/1.5ML SOPN Inject 0.5 mg into the skin every Friday. Takes in the evening    . sevelamer carbonate (RENVELA) 800 MG tablet Take 2,400 mg by mouth 3 (three) times daily.    Marland Kitchen zinc gluconate 50 MG tablet Take 50 mg by mouth daily.     No current facility-administered medications on file prior to visit.   Allergies  Allergen Reactions  . Mushroom Extract Complex Nausea Only   Social History   Socioeconomic History  . Marital status: Married    Spouse name: Not on file  . Number of children: 2  . Years of education: Not on file  . Highest education level: Not on file  Occupational History  . Not on file  Tobacco Use  . Smoking status: Former Smoker    Types: Cigars    Quit date: 12/24/1988    Years since quitting: 31.0  . Smokeless tobacco: Never Used  Vaping Use  . Vaping Use: Never used  Substance and Sexual Activity  . Alcohol use: No  . Drug use: No  . Sexual activity: Yes    Partners: Female  Other Topics Concern  . Not on file  Social History Narrative   Regular exercise: yes 3 times a week   Caffeine use: hot tea   Social Determinants of Health   Financial Resource Strain:   . Difficulty of Paying Living Expenses: Not on file  Food Insecurity:   . Worried About Charity fundraiser in the Last Year: Not on file  . Ran Out of Food in the Last Year: Not on file  Transportation Needs:   . Lack of Transportation (Medical): Not on file  . Lack of Transportation (Non-Medical): Not on file  Physical Activity:   . Days of Exercise per Week: Not on file  . Minutes of Exercise per Session: Not on file  Stress:   . Feeling of Stress : Not on file  Social Connections:   . Frequency of Communication with Friends and Family: Not on file  . Frequency of Social Gatherings with Friends and Family: Not on file  . Attends Religious Services: Not on file  . Active Member of Clubs or Organizations: Not on file  . Attends Archivist  Meetings: Not on file  . Marital Status: Not on file  Intimate Partner Violence:   . Fear of Current or Ex-Partner: Not on file  . Emotionally Abused: Not on file  . Physically Abused: Not on file  . Sexually Abused: Not on file     Examination  General - not in acute distress, comfortably sitting in chair HEENT - PEERLA, no pallor and no icterus Chest - b/l clear air entry, no additional sounds CVS- Normal s1s2, RRR Abdomen - Soft, Non tender , non distended RT foot- WRAPPED IN A SURGICAL DRESSING, HAS A WOUND VAC ( DRESSING HAS NOT BEEN REMOVED BY SURGEON POST SX) Neuro: grossly normal Back - WNL Psych : calm and cooperative   Recent labs   CBC Latest Ref Rng & Units 01/07/2020 12/27/2019 08/27/2019  WBC 3.8 - 10.8 Thousand/uL - 6.0 -  Hemoglobin 13.0 - 17.0 g/dL 10.5(L) 10.8(L) 7.8(L)  Hematocrit 39 - 52 % 31.0(L) 33.9(L) 23.0(L)  Platelets 140 - 400 Thousand/uL - 180 -   CMP Latest Ref Rng & Units 01/07/2020 08/27/2019 05/20/2019  Glucose 70 -  99 mg/dL 93 144(H) 129(H)  BUN 8 - 23 mg/dL 33(H) 44(H) 32(H)  Creatinine 0.61 - 1.24 mg/dL 5.70(H) 8.80(H) 5.60(H)  Sodium 135 - 145 mmol/L 141 136 137  Potassium 3.5 - 5.1 mmol/L 4.2 3.7 3.4(L)  Chloride 98 - 111 mmol/L 100 96(L) 95(L)  CO2 22 - 32 mmol/L - - -  Calcium 8.9 - 10.3 mg/dL - - -  Total Protein 6.5 - 8.1 g/dL - - -  Total Bilirubin 0.3 - 1.2 mg/dL - - -  Alkaline Phos 38 - 126 U/L - - -  AST 15 - 41 U/L - - -  ALT 0 - 44 U/L - - -    Pertinent Microbiology 12/03/19 Rt Foot wound cx no organisms om   Results for orders placed or performed during the hospital encounter of 01/07/20  SARS Coronavirus 2 by RT PCR (hospital order, performed in [facility] hospital lab) Nasopharyngeal Nasopharyngeal Swab     Status: None   Collection Time: 01/07/20 10:08 AM   Specimen: Nasopharyngeal Swab  Result Value Ref Range Status   SARS Coronavirus 2 NEGATIVE NEGATIVE Final    Comment: (NOTE) SARS-CoV-2 target nucleic acids  are NOT DETECTED.  The SARS-CoV-2 RNA is generally detectable in upper and lower respiratory specimens during the acute phase of infection. The lowest concentration of SARS-CoV-2 viral copies this assay can detect is 250 copies / mL. A negative result does not preclude SARS-CoV-2 infection and should not be used as the sole basis for treatment or other patient management decisions.  A negative result may occur with improper specimen collection / handling, submission of specimen other than nasopharyngeal swab, presence of viral mutation(s) within the areas targeted by this assay, and inadequate number of viral copies (<250 copies / mL). A negative result must be combined with clinical observations, patient history, and epidemiological information.  Fact Sheet for Patients:   StrictlyIdeas.no  Fact Sheet for Healthcare Providers: BankingDealers.co.za  This test is not yet approved or  cleared by the [place] FDA and has been authorized for detection and/or diagnosis of SARS-CoV-2 by FDA under an Emergency Use Authorization (EUA).  This EUA will remain in effect (meaning this test can be used) for the duration of the COVID-19 declaration under Section 564(b)(1) of the Act, 21 U.S.C. section 360bbb-3(b)(1), unless the authorization is terminated or revoked sooner.  Performed at [facility], [place] [address] [place], [place] [address]   Surgical pcr screen     Status: None   Collection Time: 01/07/20 10:55 AM   Specimen: Nasal Mucosa; Nasal Swab  Result Value Ref Range Status   MRSA, PCR NEGATIVE NEGATIVE Final   Staphylococcus aureus NEGATIVE NEGATIVE Final    Comment: (NOTE) The Xpert SA Assay (FDA approved for NASAL specimens in patients 54 years of age and older), is one component of a comprehensive surveillance program. It is not intended to diagnose infection nor to guide or monitor treatment. Performed at [facility], [place] [address], [place], [place] [address]   Aerobic/Anaerobic Culture (surgical/deep wound)     Status: None (Preliminary result)   Collection Time: 01/07/20  1:15 PM   Specimen: Bone; Tissue  Result Value Ref Range Status   Specimen Description TISSUE BONE  Final   Special Requests BONE TISSUE RIGHT FOOT SPEC A  Final   Gram Stain   Final    FEW WBC PRESENT, PREDOMINANTLY PMN NO ORGANISMS SEEN Performed at [facility], [place]  [address] [place], [place] [address]    Culture   Final    RARE STAPHYLOCOCCUS HAEMOLYTICUS RARE CORYNEBACTERIUM STRIATUM    Report Status PENDING  Incomplete   Organism ID, Bacteria STAPHYLOCOCCUS HAEMOLYTICUS  Final      Susceptibility   Staphylococcus haemolyticus - MIC*    CIPROFLOXACIN >=8 RESISTANT Resistant     ERYTHROMYCIN >=8 RESISTANT Resistant     GENTAMICIN <=0.5 SENSITIVE Sensitive     OXACILLIN >=4 RESISTANT Resistant     TETRACYCLINE >=16 RESISTANT Resistant     VANCOMYCIN <=0.5 SENSITIVE Sensitive     TRIMETH/SULFA >=320 RESISTANT Resistant     CLINDAMYCIN 1 INTERMEDIATE Intermediate     RIFAMPIN <=0.5 SENSITIVE Sensitive     Inducible Clindamycin NEGATIVE Sensitive     * RARE STAPHYLOCOCCUS HAEMOLYTICUS    Pertinent Imaging Xray Rt Foot 01/03/20 2 view radiographs of the right foot shows extensive destructive bony  changes through the base of the fifth metatarsal extending to the cuboid.   Xray Rt Foot 12/27/19  FINDINGS: Prior amputations of the fourth and fifth metatarsals. Evidence for soft tissue swelling and lucency along the lateral aspect of the foot near the tarsal bones. Mild cortical irregularity adjacent to the soft tissue swelling. Prominent enthesopathic changes at the Achilles tendon insertion site. Vascular calcifications along the dorsal aspect of the foot.  IMPRESSION: Soft tissue swelling and wound along the lateral aspect of the right foot adjacent to the tarsal bones. Mild cortical  irregularity in the region of the soft tissue swelling. Findings raise concern for osteomyelitis.  Previous amputations as described.  All pertinent labs/Imagings/notes reviewed. All pertinent plain films and CT images have been personally visualized and interpreted; radiology reports have been reviewed. Decision making incorporated into the Impression / Recommendations.  .sab

## 2020-01-10 NOTE — Progress Notes (Signed)
Call placed to [facility] location at [phone]. Per Dr. West Bali to restart Vancomycin 1 gram post dialysis x 6 weeks. Draw weekly CBC and CMP and fax results [phone].  Spoke with Elder Love and she prefers orders to be faxed at [phone].  Orders faxed and confirmation received.  [name]

## 2020-01-11 ENCOUNTER — Encounter: Payer: Self-pay | Admitting: Physician Assistant

## 2020-01-11 ENCOUNTER — Telehealth: Payer: Self-pay

## 2020-01-11 ENCOUNTER — Ambulatory Visit (INDEPENDENT_AMBULATORY_CARE_PROVIDER_SITE_OTHER): Payer: Medicare Other | Admitting: Physician Assistant

## 2020-01-11 VITALS — Ht 70.0 in | Wt 205.0 lb

## 2020-01-11 DIAGNOSIS — L97514 Non-pressure chronic ulcer of other part of right foot with necrosis of bone: Secondary | ICD-10-CM

## 2020-01-11 LAB — SEDIMENTATION RATE: Sed Rate: 41 mm/h — ABNORMAL HIGH (ref 0–20)

## 2020-01-11 LAB — C-REACTIVE PROTEIN: CRP: 35.9 mg/L — ABNORMAL HIGH (ref <8.0)

## 2020-01-11 NOTE — Telephone Encounter (Signed)
There is no message attached to this encounter?

## 2020-01-11 NOTE — Progress Notes (Signed)
Office Visit Note   Patient: [name]           Date of Birth: [birth year]           MRN: [id number] Visit Date: 01/11/2020              Requested by: Lorenda Hatchet, FNP [address] [place],  [place] [address] PCP: Lorenda Hatchet, FNP  Chief Complaint  Patient presents with  . Right Foot - Routine Post Op    01/07/20 right foot excision infected bone       HPI: I think forwarded to [place] patient is 5 days status post right foot excision of infected bone comes in today because his wound VAC is beeping.  Assessment & Plan: Visit Diagnoses: No diagnosis found.  Plan: Wound VAC was removed without difficulty due to [facility] date dressing changes they would like to keep their appointment with Dr. [name] on Thursday and I think this is fine.  Follow-up at that time.  Follow-Up Instructions: No follow-ups on file.   Ortho Exam  Patient is alert, oriented, no adenopathy, well-dressed, normal affect, normal respiratory effort. Wound VAC was removed without any difficulty.  Swelling is minimal.  Incision has well apposed wound edges with just minimal skin maceration.  No sign of necrosis just a small amount of bloody drainage dry dressing was applied no cellulitis  Imaging: No results found. No images are attached to the encounter.  Labs: Lab Results  Component Value Date   HGBA1C 6.3 (H) 12/14/2018   HGBA1C 6.0 (H) 12/24/2015   HGBA1C 7.0 (H) 12/25/2013   ESRSEDRATE 41 (H) 01/10/2020   ESRSEDRATE 55 (H) 12/27/2019   ESRSEDRATE 131 (H) 12/14/2018   CRP 35.9 (H) 01/10/2020   CRP 77.9 (H) 12/27/2019   CRP 32.1 (H) 12/14/2018   REPTSTATUS PENDING 01/07/2020   GRAMSTAIN  01/07/2020    FEW WBC PRESENT, PREDOMINANTLY PMN NO ORGANISMS SEEN Performed at [place] [address], [place], [place] [address]    CULT  01/07/2020    RARE STAPHYLOCOCCUS HAEMOLYTICUS RARE CORYNEBACTERIUM STRIATUM Standardized susceptibility testing for this organism is  not available. NO ANAEROBES ISOLATED; CULTURE IN PROGRESS FOR 5 DAYS    LABORGA STAPHYLOCOCCUS HAEMOLYTICUS 01/07/2020     Lab Results  Component Value Date   ALBUMIN 3.0 (L) 12/14/2018   ALBUMIN 3.7 12/24/2015   ALBUMIN 3.7 12/28/2014   PREALBUMIN 14.8 (L) 12/14/2018    No results found for: MG No results found for: VD25OH  Lab Results  Component Value Date   PREALBUMIN 14.8 (L) 12/14/2018   CBC EXTENDED Latest Ref Rng & Units 01/07/2020 12/27/2019 08/27/2019  WBC 3.8 - 10.8 Thousand/uL - 6.0 -  RBC 4.20 - 5.80 Million/uL - 3.85(L) -  HGB 13.0 - 17.0 g/dL 10.5(L) 10.8(L) 7.8(L)  HCT 39 - 52 % 31.0(L) 33.9(L) 23.0(L)  PLT 140 - 400 Thousand/uL - 180 -  NEUTROABS 1,500 - 7,800 cells/uL - - -  LYMPHSABS 850 - 3,900 cells/uL - - -     Body mass index is 29.41 kg/m.  Orders:  No orders of the defined types were placed in this encounter.  No orders of the defined types were placed in this encounter.    Procedures: No procedures performed  Clinical Data: No additional findings.  ROS:  All other systems negative, except as noted in the HPI. Review of Systems  Objective: Vital Signs: Ht 5\' 10"  (1.778 m)   Wt 205  lb (93 kg)   BMI 29.41 kg/m   Specialty Comments:  No specialty comments available.  PMFS History: Patient Active Problem List   Diagnosis Date Noted  . History of partial ray amputation of fourth toe of right foot ([place]) 09/08/2019  . Ulcerated, foot, right, with necrosis of bone ([place])   . Chronic cough 06/25/2019  . Cutaneous abscess of right foot   . Subacute osteomyelitis of right foot ([place]) 12/14/2018  . AKI (acute kidney injury) ([place]) 12/14/2018  . CKD (chronic kidney disease) stage 4, GFR 15-29 ml/min (HCC) 12/14/2018  . Gait abnormality 08/24/2018  . Diabetic neuropathy ([place]) 02/06/2018  . Cervical myelopathy ([place]) 02/06/2018  . Onychomycosis 10/30/2017  . Other spondylosis with radiculopathy, cervical region 01/27/2017  . Midfoot  ulcer, right, limited to breakdown of skin ([place]) 11/15/2016  . Lateral epicondylitis, left elbow 08/12/2016  . Cellulitis of fifth toe of right foot 07/29/2016  . Prostate cancer ([place]) 06/19/2016  . Non-pressure chronic ulcer of other part of right foot limited to breakdown of skin ([place]) 04/04/2016  . Cellulitis of leg, right 12/24/2015  . Cellulitis of right lower extremity 12/24/2015  . CKD (chronic kidney disease), stage III ([place]) 12/25/2013  . Foot infection 12/24/2013  . Foot ulcer, left ([place]) 12/24/2013  . Diabetic foot ulcer ([place]) 09/14/2012  . Gout 09/14/2012  . HTN (hypertension) 09/14/2012  . Hypothyroidism 09/14/2012  . CAD (coronary artery disease) 09/14/2012  . Diabetes mellitus ([place]) 09/14/2012  . Hypercholesteremia   . Neuropathy [facility])    Past Medical History:  Diagnosis Date  . Ambulates with cane    straight cane  . Cervical myelopathy ([name]) 02/06/2018  . Chronic kidney disease    dailysis M W F- home  . Complication of anesthesia   . Coronary artery disease   . Diabetes mellitus without complication ([place])    type2  . Diabetic foot ulcer ([place])   . Diabetic foot ulcer ([place])   . Diabetic neuropathy ([place]) 02/06/2018  . Gait abnormality 08/24/2018  . GERD (gastroesophageal reflux disease)     01/06/20- not current  . Gout   . History of kidney stones    passed stones  . Hypercholesteremia   . Hypertension   . Hypothyroidism   . Neuromuscular disorder (HCC)    neuropathy left leg and bilateral feet  . Neuropathy   . PONV (postoperative nausea and vomiting)   . Prostate cancer [facility])     Family History  Problem Relation Age of Onset  . Diabetes Mellitus II Mother   . Diabetes Mellitus II Father   . CAD Father   . Cancer Father        prostate  . Diabetes Mellitus II Brother   . Diabetes Mellitus II Brother   . Stomach cancer Brother 9  . Colon cancer Neg Hx   . Colon polyps Neg Hx   . Esophageal cancer Neg Hx   . Rectal cancer Neg Hx     Past  Surgical History:  Procedure Laterality Date  . AMPUTATION Left 12/25/2013   Procedure: AMPUTATION RAY LEFT 5TH RAY;  Surgeon: [name], MD;  [facility];  Service: Orthopedics;  Laterality: Left;  . AMPUTATION Right 12/15/2018   Procedure: AMPUTATION OF 4TH AND 5TH TOES RIGHT FOOT;  Surgeon: [name], MD;  Location: [place];  Service: Orthopedics;  Laterality: Right;  . APPLICATION OF WOUND VAC Right 12/15/2018   Procedure: APPLICATION OF WOUND VAC;  Surgeon: [name],  MD;  Location: [place];  Service: Orthopedics;  Laterality: Right;  . AV FISTULA PLACEMENT Left 03/25/2019   Procedure: LEFT ARM ARTERIOVENOUS (AV) FISTULA CREATION;  Surgeon: [name], MD;  Location: [place];  Service: Vascular;  Laterality: Left;  . BACK SURGERY    . BASCILIC VEIN TRANSPOSITION Left 05/20/2019   Procedure: SECOND STAGE LEFT BASCILIC VEIN TRANSPOSITION;  Surgeon: [name], MD;  Location: [place];  Service: Vascular;  Laterality: Left;  . CARDIAC CATHETERIZATION  02/17/2014  . CHOLECYSTECTOMY    . COLONOSCOPY  2011   in [place], [place] GRAFT  2008  . I & D EXTREMITY Right 12/15/2018   Procedure: DEBRIDEMENT RIGHT FOOT;  Surgeon: [name], MD;  Location: [place];  Service: Orthopedics;  Laterality: Right;  . I & D EXTREMITY Right 08/27/2019   Procedure: PARTIAL CUBOID EXCISION RIGHT FOOT;  Surgeon: [name], MD;  Location: [place];  Service: Orthopedics;  Laterality: Right;  . I & D EXTREMITY Right 01/07/2020   Procedure: RIGHT FOOT EXCISION INFECTED BONE;  Surgeon: [name], MD;  Location: [place];  Service: Orthopedics;  Laterality: Right;  . NECK SURGERY     novemver 2019  . WISDOM TOOTH EXTRACTION     Social History   Occupational History  . Not on file  Tobacco Use  . Smoking status: Former Smoker    Types: Cigars    Quit date: 12/24/1988    Years since quitting: 31.0  . Smokeless tobacco: Never Used  Vaping Use  . Vaping Use:  Never used  Substance and Sexual Activity  . Alcohol use: No  . Drug use: No  . Sexual activity: Yes    Partners: Female

## 2020-01-12 ENCOUNTER — Telehealth: Payer: Self-pay

## 2020-01-12 ENCOUNTER — Other Ambulatory Visit: Payer: Self-pay | Admitting: Infectious Diseases

## 2020-01-12 MED ORDER — SULFAMETHOXAZOLE-TRIMETHOPRIM 400-80 MG PO TABS: 1.0000 | ORAL_TABLET | Freq: Two times a day (BID) | ORAL | 1 refills | Status: DC

## 2020-01-12 NOTE — Progress Notes (Unsigned)
batr

## 2020-01-12 NOTE — Telephone Encounter (Signed)
-----   Message from Rosiland Oz, MD sent at 01/12/2020  2:39 PM EDT ----- Regarding: Bactrim prescribed His bone cx has grown one more organism Stenotrophomonas maltophilia. I have sent bactrim 1 ds bid to his pharmacy.

## 2020-01-12 NOTE — Telephone Encounter (Signed)
Patient made aware of culture results and new medication sent to pharmacy. Patient states he will pick it up today. Patient very appreciative of call.  Eugenia Mcalpine

## 2020-01-13 ENCOUNTER — Encounter: Payer: Self-pay | Admitting: Orthopedic Surgery

## 2020-01-13 ENCOUNTER — Ambulatory Visit (INDEPENDENT_AMBULATORY_CARE_PROVIDER_SITE_OTHER): Payer: Medicare Other | Admitting: Orthopedic Surgery

## 2020-01-13 VITALS — Ht 70.0 in | Wt 205.0 lb

## 2020-01-13 DIAGNOSIS — L97514 Non-pressure chronic ulcer of other part of right foot with necrosis of bone: Secondary | ICD-10-CM

## 2020-01-15 LAB — AEROBIC/ANAEROBIC CULTURE W GRAM STAIN (SURGICAL/DEEP WOUND)

## 2020-01-17 ENCOUNTER — Encounter: Payer: Self-pay | Admitting: Orthopedic Surgery

## 2020-01-17 NOTE — Progress Notes (Signed)
Office Visit Note   Patient: [name]           Date of Birth: 07-18-1950           MRN: [id number] Visit Date: 01/13/2020              Requested by: Lorenda Hatchet, [place] [address],  [place] [address] PCP: Lorenda Hatchet, FNP  Chief Complaint  Patient presents with  . Right Foot - Routine Post Op    01/07/20 right excision infected bone       HPI: Patient is a 69 year old gentleman who presents for initial follow-up status post lateral column bony excision for infection on 01/07/2020.    He has been nonweightbearing on a scooter.  He is currently receiving vancomycin with dialysis.  He is also on oral sulfamethoxazole trimethoprim.  Assessment & Plan: Visit Diagnoses:  1. Ulcerated, foot, right, with necrosis of bone ([place])     Plan: We will follow-up Thursday he will continue routine wound care.  Follow-Up Instructions: Return in about 1 week (around 01/20/2020).   Ortho Exam  Patient is alert, oriented, no adenopathy, well-dressed, normal affect, normal respiratory effort. Examination of the wound edges are well approximated there is no ischemic changes no cellulitis no drainage no signs of infection.  Begin routine wound care with washing with soap and water continue nonweightbearing.  Imaging: No results found. No images are attached to the encounter.  Labs: Lab Results  Component Value Date   HGBA1C 6.3 (H) 12/14/2018   HGBA1C 6.0 (H) 12/24/2015   HGBA1C 7.0 (H) 12/25/2013   ESRSEDRATE 41 (H) 01/10/2020   ESRSEDRATE 55 (H) 12/27/2019   ESRSEDRATE 131 (H) 12/14/2018   CRP 35.9 (H) 01/10/2020   CRP 77.9 (H) 12/27/2019   CRP 32.1 (H) 12/14/2018   REPTSTATUS 01/15/2020 FINAL 01/07/2020   GRAMSTAIN  01/07/2020    FEW WBC PRESENT, PREDOMINANTLY PMN NO ORGANISMS SEEN    CULT  01/07/2020    RARE STAPHYLOCOCCUS HAEMOLYTICUS RARE CORYNEBACTERIUM STRIATUM Standardized susceptibility testing for this organism is not  available. RARE STENOTROPHOMONAS MALTOPHILIA CRITICAL RESULT CALLED TO, READ BACK BY AND VERIFIED WITH: DR Keshav Winegar VIA EPIC [id number] FCP NO ANAEROBES ISOLATED Performed at [facility], [place] [address] [place],  [address]    LABORGA STAPHYLOCOCCUS HAEMOLYTICUS 01/07/2020   LABORGA STENOTROPHOMONAS MALTOPHILIA 01/07/2020     Lab Results  Component Value Date   ALBUMIN 3.0 (L) 12/14/2018   ALBUMIN 3.7 12/24/2015   ALBUMIN 3.7 12/28/2014   PREALBUMIN 14.8 (L) 12/14/2018    No results found for: MG No results found for: VD25OH  Lab Results  Component Value Date   PREALBUMIN 14.8 (L) 12/14/2018   CBC EXTENDED Latest Ref Rng & Units 01/07/2020 12/27/2019 08/27/2019  WBC 3.8 - 10.8 Thousand/uL - 6.0 -  RBC 4.20 - 5.80 Million/uL - 3.85(L) -  HGB 13.0 - 17.0 g/dL 10.5(L) 10.8(L) 7.8(L)  HCT 39 - 52 % 31.0(L) 33.9(L) 23.0(L)  PLT 140 - 400 Thousand/uL - 180 -  NEUTROABS 1,500 - 7,800 cells/uL - - -  LYMPHSABS 850 - 3,900 cells/uL - - -     Body mass index is 29.41 kg/m.  Orders:  No orders of the defined types were placed in this encounter.  No orders of the defined types were placed in this encounter.    Procedures: No procedures performed  Clinical Data: No additional findings.  ROS:  All other systems negative, except  as noted in the HPI. Review of Systems  Objective: Vital Signs: Ht 5\' 10"  (1.778 m)   Wt 205 lb (93 kg)   BMI 29.41 kg/m   Specialty Comments:  No specialty comments available.  PMFS History: Patient Active Problem List   Diagnosis Date Noted  . History of partial ray amputation of fourth toe of right foot ([place]) 09/08/2019  . Ulcerated, foot, right, with necrosis of bone ([place])   . Chronic cough 06/25/2019  . Cutaneous abscess of right foot   . Subacute osteomyelitis of right foot ([place]) 12/14/2018  . AKI (acute kidney injury) ([place]) 12/14/2018  . CKD (chronic kidney disease) stage 4, GFR 15-29 ml/min (HCC) 12/14/2018  . Gait  abnormality 08/24/2018  . Diabetic neuropathy ([place]) 02/06/2018  . Cervical myelopathy ([place]) 02/06/2018  . Onychomycosis 10/30/2017  . Other spondylosis with radiculopathy, cervical region 01/27/2017  . Midfoot ulcer, right, limited to breakdown of skin ([place]) 11/15/2016  . Lateral epicondylitis, left elbow 08/12/2016  . Cellulitis of fifth toe of right foot 07/29/2016  . Prostate cancer ([place]) 06/19/2016  . Non-pressure chronic ulcer of other part of right foot limited to breakdown of skin ([place]) 04/04/2016  . Cellulitis of leg, right 12/24/2015  . Cellulitis of right lower extremity 12/24/2015  . CKD (chronic kidney disease), stage III ([place]) 12/25/2013  . Foot infection 12/24/2013  . Foot ulcer, left ([place]) 12/24/2013  . Diabetic foot ulcer ([place]) 09/14/2012  . Gout 09/14/2012  . HTN (hypertension) 09/14/2012  . Hypothyroidism 09/14/2012  . CAD (coronary artery disease) 09/14/2012  . Diabetes mellitus ([place]) 09/14/2012  . Hypercholesteremia   . Neuropathy [facility])    Past Medical History:  Diagnosis Date  . Ambulates with cane    straight cane  . Cervical myelopathy ([place]) 02/06/2018  . Chronic kidney disease    dailysis M W F- home  . Complication of anesthesia   . Coronary artery disease   . Diabetes mellitus without complication ([place])    type2  . Diabetic foot ulcer ([place])   . Diabetic foot ulcer ([place])   . Diabetic neuropathy ([place]) 02/06/2018  . Gait abnormality 08/24/2018  . GERD (gastroesophageal reflux disease)     01/06/20- not current  . Gout   . History of kidney stones    passed stones  . Hypercholesteremia   . Hypertension   . Hypothyroidism   . Neuromuscular disorder (HCC)    neuropathy left leg and bilateral feet  . Neuropathy   . PONV (postoperative nausea and vomiting)   . Prostate cancer [facility])     Family History  Problem Relation Age of Onset  . Diabetes Mellitus II Mother   . Diabetes Mellitus II Father   . CAD Father   . Cancer Father        prostate  .  Diabetes Mellitus II Brother   . Diabetes Mellitus II Brother   . Stomach cancer Brother 74  . Colon cancer Neg Hx   . Colon polyps Neg Hx   . Esophageal cancer Neg Hx   . Rectal cancer Neg Hx     Past Surgical History:  Procedure Laterality Date  . AMPUTATION Left 12/25/2013   Procedure: AMPUTATION RAY LEFT 5TH RAY;  Surgeon: [name], MD;  Location: [facility];  Service: Orthopedics;  Laterality: Left;  . AMPUTATION Right 12/15/2018   Procedure: AMPUTATION OF 4TH AND 5TH TOES RIGHT FOOT;  Surgeon: [name], MD;  Location: [place];  Service: Orthopedics;  Laterality:  Right;  Marland Kitchen APPLICATION OF WOUND VAC Right 12/15/2018   Procedure: APPLICATION OF WOUND VAC;  Surgeon: [name], MD;  Location: [place];  Service: Orthopedics;  Laterality: Right;  . AV FISTULA PLACEMENT Left 03/25/2019   Procedure: LEFT ARM ARTERIOVENOUS (AV) FISTULA CREATION;  Surgeon: [name], MD;  Location: [place];  Service: Vascular;  Laterality: Left;  . BACK SURGERY    . BASCILIC VEIN TRANSPOSITION Left 05/20/2019   Procedure: SECOND STAGE LEFT BASCILIC VEIN TRANSPOSITION;  Surgeon: [name], MD;  Location: [place];  Service: Vascular;  Laterality: Left;  . CARDIAC CATHETERIZATION  02/17/2014  . CHOLECYSTECTOMY    . COLONOSCOPY  2011   in [place], [place] GRAFT  2008  . I & D EXTREMITY Right 12/15/2018   Procedure: DEBRIDEMENT RIGHT FOOT;  Surgeon: [name], MD;  Location: [place];  Service: Orthopedics;  Laterality: Right;  . I & D EXTREMITY Right 08/27/2019   Procedure: PARTIAL CUBOID EXCISION RIGHT FOOT;  Surgeon: [name], MD;  Location: [place];  Service: Orthopedics;  Laterality: Right;  . I & D EXTREMITY Right 01/07/2020   Procedure: RIGHT FOOT EXCISION INFECTED BONE;  Surgeon: [name], MD;  Location: [place];  Service: Orthopedics;  Laterality: Right;  . NECK SURGERY     novemver 2019  . WISDOM TOOTH EXTRACTION     Social History   Occupational  History  . Not on file  Tobacco Use  . Smoking status: Former Smoker    Types: Cigars    Quit date: 12/24/1988    Years since quitting: 31.0  . Smokeless tobacco: Never Used  Vaping Use  . Vaping Use: Never used  Substance and Sexual Activity  . Alcohol use: No  . Drug use: No  . Sexual activity: Yes    Partners: Female

## 2020-01-20 ENCOUNTER — Ambulatory Visit (INDEPENDENT_AMBULATORY_CARE_PROVIDER_SITE_OTHER): Payer: Medicare Other | Admitting: Orthopedic Surgery

## 2020-01-20 ENCOUNTER — Encounter: Payer: Self-pay | Admitting: Orthopedic Surgery

## 2020-01-20 VITALS — Ht 70.0 in | Wt 205.0 lb

## 2020-01-20 DIAGNOSIS — L97514 Non-pressure chronic ulcer of other part of right foot with necrosis of bone: Secondary | ICD-10-CM

## 2020-01-20 NOTE — Progress Notes (Signed)
Office Visit Note   Patient: [name]           Date of Birth: Nov 01, 1950           MRN: [id number] Visit Date: 01/20/2020              Requested by: Lorenda Hatchet, FNP [address] [place],  [place] [address] PCP: Lorenda Hatchet, FNP  Chief Complaint  Patient presents with  . Right Foot - Routine Post Op    01/07/20 right foot excision infected bone.       HPI: Patient presents in follow-up status post revision lateral column right foot.  Patient is receiving antibiotics with dialysis and states that he has no foot symptoms he has been nonweightbearing he is pleased with his progress.  Assessment & Plan: Visit Diagnoses:  1. Ulcerated, foot, right, with necrosis of bone ([place])     Plan: Plan to follow-up in 1 week to harvest the suture.  Recommended continued protected weightbearing for about 4 to 6 weeks.  Discussed that he may drive to work.  Follow-Up Instructions: Return in about 1 week (around 01/27/2020).   Ortho Exam  Patient is alert, oriented, no adenopathy, well-dressed, normal affect, normal respiratory effort. Examination the incision is well approximated there is no swelling no drainage no odor no signs of infection.  Imaging: No results found. No images are attached to the encounter.  Labs: Lab Results  Component Value Date   HGBA1C 6.3 (H) 12/14/2018   HGBA1C 6.0 (H) 12/24/2015   HGBA1C 7.0 (H) 12/25/2013   ESRSEDRATE 41 (H) 01/10/2020   ESRSEDRATE 55 (H) 12/27/2019   ESRSEDRATE 131 (H) 12/14/2018   CRP 35.9 (H) 01/10/2020   CRP 77.9 (H) 12/27/2019   CRP 32.1 (H) 12/14/2018   REPTSTATUS 01/15/2020 FINAL 01/07/2020   GRAMSTAIN  01/07/2020    FEW WBC PRESENT, PREDOMINANTLY PMN NO ORGANISMS SEEN    CULT  01/07/2020    RARE STAPHYLOCOCCUS HAEMOLYTICUS RARE CORYNEBACTERIUM STRIATUM Standardized susceptibility testing for this organism is not available. RARE STENOTROPHOMONAS MALTOPHILIA CRITICAL RESULT CALLED TO, READ  BACK BY AND VERIFIED WITH: DR Sonia Bromell VIA EPIC [phone] [id number] FCP NO ANAEROBES ISOLATED Performed at [facility], [place] [address], [place], [place] [address]    LABORGA STAPHYLOCOCCUS HAEMOLYTICUS 01/07/2020   LABORGA STENOTROPHOMONAS MALTOPHILIA 01/07/2020     Lab Results  Component Value Date   ALBUMIN 3.0 (L) 12/14/2018   ALBUMIN 3.7 12/24/2015   ALBUMIN 3.7 12/28/2014   PREALBUMIN 14.8 (L) 12/14/2018    No results found for: MG No results found for: VD25OH  Lab Results  Component Value Date   PREALBUMIN 14.8 (L) 12/14/2018   CBC EXTENDED Latest Ref Rng & Units 01/07/2020 12/27/2019 08/27/2019  WBC 3.8 - 10.8 Thousand/uL - 6.0 -  RBC 4.20 - 5.80 Million/uL - 3.85(L) -  HGB 13.0 - 17.0 g/dL 10.5(L) 10.8(L) 7.8(L)  HCT 39 - 52 % 31.0(L) 33.9(L) 23.0(L)  PLT 140 - 400 Thousand/uL - 180 -  NEUTROABS 1,500 - 7,800 cells/uL - - -  LYMPHSABS 850 - 3,900 cells/uL - - -     Body mass index is 29.41 kg/m.  Orders:  No orders of the defined types were placed in this encounter.  No orders of the defined types were placed in this encounter.    Procedures: No procedures performed  Clinical Data: No additional findings.  ROS:  All other systems negative, except as noted in the HPI. Review  of Systems  Objective: Vital Signs: Ht 5\' 10"  (1.778 m)   Wt 205 lb (93 kg)   BMI 29.41 kg/m   Specialty Comments:  No specialty comments available.  PMFS History: Patient Active Problem List   Diagnosis Date Noted  . History of partial ray amputation of fourth toe of right foot ([place]) 09/08/2019  . Ulcerated, foot, right, with necrosis of bone ([place])   . Chronic cough 06/25/2019  . Cutaneous abscess of right foot   . Subacute osteomyelitis of right foot ([place]) 12/14/2018  . AKI (acute kidney injury) ([place]) 12/14/2018  . CKD (chronic kidney disease) stage 4, GFR 15-29 ml/min (HCC) 12/14/2018  . Gait abnormality 08/24/2018  . Diabetic neuropathy ([place]) 02/06/2018  . Cervical  myelopathy ([place]) 02/06/2018  . Onychomycosis 10/30/2017  . Other spondylosis with radiculopathy, cervical region 01/27/2017  . Midfoot ulcer, right, limited to breakdown of skin ([place]) 11/15/2016  . Lateral epicondylitis, left elbow 08/12/2016  . Cellulitis of fifth toe of right foot 07/29/2016  . Prostate cancer ([place]) 06/19/2016  . Non-pressure chronic ulcer of other part of right foot limited to breakdown of skin ([place]) 04/04/2016  . Cellulitis of leg, right 12/24/2015  . Cellulitis of right lower extremity 12/24/2015  . CKD (chronic kidney disease), stage III ([place]) 12/25/2013  . Foot infection 12/24/2013  . Foot ulcer, left ([place]) 12/24/2013  . Diabetic foot ulcer ([place]) 09/14/2012  . Gout 09/14/2012  . HTN (hypertension) 09/14/2012  . Hypothyroidism 09/14/2012  . CAD (coronary artery disease) 09/14/2012  . Diabetes mellitus ([place]) 09/14/2012  . Hypercholesteremia   . Neuropathy [facility])    Past Medical History:  Diagnosis Date  . Ambulates with cane    straight cane  . Cervical myelopathy ([place]) 02/06/2018  . Chronic kidney disease    dailysis M W F- home  . Complication of anesthesia   . Coronary artery disease   . Diabetes mellitus without complication ([place])    type2  . Diabetic foot ulcer ([place])   . Diabetic foot ulcer ([place])   . Diabetic neuropathy ([place]) 02/06/2018  . Gait abnormality 08/24/2018  . GERD (gastroesophageal reflux disease)     01/06/20- not current  . Gout   . History of kidney stones    passed stones  . Hypercholesteremia   . Hypertension   . Hypothyroidism   . Neuromuscular disorder (HCC)    neuropathy left leg and bilateral feet  . Neuropathy   . PONV (postoperative nausea and vomiting)   . Prostate cancer [facility])     Family History  Problem Relation Age of Onset  . Diabetes Mellitus II Mother   . Diabetes Mellitus II Father   . CAD Father   . Cancer Father        prostate  . Diabetes Mellitus II Brother   . Diabetes Mellitus II Brother   . Stomach  cancer Brother 44  . Colon cancer Neg Hx   . Colon polyps Neg Hx   . Esophageal cancer Neg Hx   . Rectal cancer Neg Hx     Past Surgical History:  Procedure Laterality Date  . AMPUTATION Left 12/25/2013   Procedure: AMPUTATION RAY LEFT 5TH RAY;  Surgeon: [name], MD;  [facility];  Service: Orthopedics;  Laterality: Left;  . AMPUTATION Right 12/15/2018   Procedure: AMPUTATION OF 4TH AND 5TH TOES RIGHT FOOT;  Surgeon: [name], MD;  Location: [place];  Service: Orthopedics;  Laterality: Right;  . APPLICATION OF WOUND  VAC Right 12/15/2018   Procedure: APPLICATION OF WOUND VAC;  Surgeon: [name], MD;  Location: [place];  Service: Orthopedics;  Laterality: Right;  . AV FISTULA PLACEMENT Left 03/25/2019   Procedure: LEFT ARM ARTERIOVENOUS (AV) FISTULA CREATION;  Surgeon: [name], MD;  Location: [place];  Service: Vascular;  Laterality: Left;  . BACK SURGERY    . BASCILIC VEIN TRANSPOSITION Left 05/20/2019   Procedure: SECOND STAGE LEFT BASCILIC VEIN TRANSPOSITION;  Surgeon: [name], MD;  Location: [place];  Service: Vascular;  Laterality: Left;  . CARDIAC CATHETERIZATION  02/17/2014  . CHOLECYSTECTOMY    . COLONOSCOPY  2011   in [place], [place] GRAFT  2008  . I & D EXTREMITY Right 12/15/2018   Procedure: DEBRIDEMENT RIGHT FOOT;  Surgeon: [name], MD;  Location: [place];  Service: Orthopedics;  Laterality: Right;  . I & D EXTREMITY Right 08/27/2019   Procedure: PARTIAL CUBOID EXCISION RIGHT FOOT;  Surgeon: [name], MD;  Location: [place];  Service: Orthopedics;  Laterality: Right;  . I & D EXTREMITY Right 01/07/2020   Procedure: RIGHT FOOT EXCISION INFECTED BONE;  Surgeon: [name], MD;  Location: [place];  Service: Orthopedics;  Laterality: Right;  . NECK SURGERY     novemver 2019  . WISDOM TOOTH EXTRACTION     Social History   Occupational History  . Not on file  Tobacco Use  . Smoking status: Former Smoker     Types: Cigars    Quit date: 12/24/1988    Years since quitting: 31.0  . Smokeless tobacco: Never Used  Vaping Use  . Vaping Use: Never used  Substance and Sexual Activity  . Alcohol use: No  . Drug use: No  . Sexual activity: Yes    Partners: Female

## 2020-01-24 ENCOUNTER — Ambulatory Visit (INDEPENDENT_AMBULATORY_CARE_PROVIDER_SITE_OTHER): Payer: Medicare Other | Admitting: Infectious Diseases

## 2020-01-24 ENCOUNTER — Telehealth: Payer: Self-pay

## 2020-01-24 ENCOUNTER — Encounter: Payer: Self-pay | Admitting: Infectious Diseases

## 2020-01-24 ENCOUNTER — Other Ambulatory Visit: Payer: Self-pay

## 2020-01-24 VITALS — BP 193/91 | HR 78 | Temp 97.4°F

## 2020-01-24 DIAGNOSIS — M869 Osteomyelitis, unspecified: Secondary | ICD-10-CM | POA: Diagnosis present

## 2020-01-24 MED ORDER — SULFAMETHOXAZOLE-TRIMETHOPRIM 400-80 MG PO TABS: 1.0000 | ORAL_TABLET | Freq: Three times a day (TID) | ORAL | 0 refills | Status: DC

## 2020-01-24 NOTE — Telephone Encounter (Signed)
Verbal orders given to Vickie with [facility] location to end patient's Vancomycin 02/18/20 per Dr. [place]. Vickie read back orders and verbalized understanding. Gordon Vandunk T Brooks Sailors

## 2020-01-24 NOTE — Progress Notes (Signed)
[facility] Infectious [facility]                                                             [place], [place], [place], [address]                                                                  Phn. [phone]; Fax: [phone]                                                                             Date: 01/24/20  Reason for Follow Up: Diabetic Foot Osteomyelitis   Assessment/Plan 1. Rt Foot Diabetic Ulcer/Rt Cuboid and 4th metatarsal base Osteomyelitis S/p Rt foot excisional debridement on 10/1. OR cx is growing Staphylococcus haemolyticus,  Corynebacterium striatum and Stenotrophomonas maltophilia   2. Medication Monitoring:  01/20/20 WBC 4.63, hb 9.5, platelets 150, k 4.3 3. Insulin Diabetes mellitus with Diabetic Neuropathy (a1c 6.3) 4. ESRD on HD    Plan Continue Vancomycin with HD to complete remainder of 6 weeks. End date is 02/18/20 Will Increase Bactrim from 1 DS PO BID to 1 DS PO TID. End date 02/23/20 Weekly CBC, BMP and vancomycin trough  HD not able to do weekly CRP and ESR. So, I will do ESR and CRP today  Follow up in 4 weeks   I spent greater than 25  minutes with the patient including greater than 50% of time in face to face counsel of the patient and in coordination of their care.   Rosiland Oz, MD [facility] Infectious Diseases  Office phone [phone] Fax no. [phone] ______________________________________________________________________________________________________________________ Subjective:  69 Y O Caucasian Male with a PMH of IDDM, Diabetic Neuropathy, CAD, HTN, Hypothyroidism, ESRD on HD via Left arm fistula ( 4 times in a week, HLD, s/p 4th and 5th ray amputation in 12/2018 who is here for follow up of Rt lateral Foot Osteomyelitis. Patient underwent excisional debridement of Rt lateral foot on 10/1. He has been following Dr [name] and has a fu  appointment soon for removal of sutures. Getting Vancomycin with HD. End date for 6 weeks 02/18/20. Denies any issues with AV fistula. He has also been taking Bactrim POI BID since 10/6. Denies any nausea/vomiting/diarrhea/rashes or itching with the antibiotics. He is very impressed with the healing of the wound and was concerned if he would have recurrent foot infection. Discussed about foot care as well as diabetes control. He has no other complaints today.  ROS - 10 point ROS negative except as above  Past Medical History:  Diagnosis Date  . Ambulates with cane    straight cane  . Cervical myelopathy ([place]) 02/06/2018  . Chronic kidney disease    dailysis M W F- home  . Complication of anesthesia   .  Coronary artery disease   . Diabetes mellitus without complication ([place])    type2  . Diabetic foot ulcer ([place])   . Diabetic foot ulcer ([place])   . Diabetic neuropathy ([place]) 02/06/2018  . Gait abnormality 08/24/2018  . GERD (gastroesophageal reflux disease)     01/06/20- not current  . Gout   . History of kidney stones    passed stones  . Hypercholesteremia   . Hypertension   . Hypothyroidism   . Neuromuscular disorder (HCC)    neuropathy left leg and bilateral feet  . Neuropathy   . PONV (postoperative nausea and vomiting)   . Prostate cancer [facility])    Past Surgical History:  Procedure Laterality Date  . AMPUTATION Left 12/25/2013   Procedure: AMPUTATION RAY LEFT 5TH RAY;  Surgeon: [name], MD;  [facility]: [facility];  Service: Orthopedics;  Laterality: Left;  . AMPUTATION Right 12/15/2018   Procedure: AMPUTATION OF 4TH AND 5TH TOES RIGHT FOOT;  Surgeon: [name], MD;  Location: [place];  Service: Orthopedics;  Laterality: Right;  . APPLICATION OF WOUND VAC Right 12/15/2018   Procedure: APPLICATION OF WOUND VAC;  Surgeon: [name], MD;  Location: [place];  Service: Orthopedics;  Laterality: Right;  . AV FISTULA PLACEMENT Left 03/25/2019   Procedure: LEFT ARM ARTERIOVENOUS (AV)  FISTULA CREATION;  Surgeon: [name], MD;  Location: [place];  Service: Vascular;  Laterality: Left;  . BACK SURGERY    . BASCILIC VEIN TRANSPOSITION Left 05/20/2019   Procedure: SECOND STAGE LEFT BASCILIC VEIN TRANSPOSITION;  Surgeon: [name], MD;  Location: [place];  Service: Vascular;  Laterality: Left;  . CARDIAC CATHETERIZATION  02/17/2014  . CHOLECYSTECTOMY    . COLONOSCOPY  2011   in [place], [place] GRAFT  2008  . I & D EXTREMITY Right 12/15/2018   Procedure: DEBRIDEMENT RIGHT FOOT;  Surgeon: [name], MD;  Location: [place];  Service: Orthopedics;  Laterality: Right;  . I & D EXTREMITY Right 08/27/2019   Procedure: PARTIAL CUBOID EXCISION RIGHT FOOT;  Surgeon: [name], MD;  Location: [place];  Service: Orthopedics;  Laterality: Right;  . I & D EXTREMITY Right 01/07/2020   Procedure: RIGHT FOOT EXCISION INFECTED BONE;  Surgeon: [name], MD;  Location: [place];  Service: Orthopedics;  Laterality: Right;  . NECK SURGERY     novemver 2019  . WISDOM TOOTH EXTRACTION     Current Outpatient Medications on File Prior to Visit  Medication Sig Dispense Refill  . allopurinol (ZYLOPRIM) 100 MG tablet Take 200 mg by mouth daily.     Marland Kitchen aspirin EC 81 MG tablet Take 81 mg by mouth every morning.     . B Complex-C-Zn-Folic Acid (DIALYVITE 244 WITH ZINC) 0.8 MG TABS Take 1 tablet by mouth daily.    . calcitRIOL (ROCALTROL) 0.5 MCG capsule Take 0.5 mcg by mouth 3 (three) times a week.    . carvedilol (COREG) 6.25 MG tablet Take 6.25 mg by mouth 2 (two) times daily.    . chlorhexidine (PERIDEX) 0.12 % solution 15 mLs by Mouth Rinse route daily.    . diclofenac Sodium (VOLTAREN) 1 % GEL Apply 1 application topically daily as needed (pain).     . Ibuprofen-Acetaminophen (ADVIL DUAL ACTION) 125-250 MG TABS Take 4 tablets by mouth daily as needed (Pain).    . insulin degludec (TRESIBA FLEXTOUCH) 200 UNIT/ML FlexTouch Pen Inject 10 Units into the  skin  at bedtime.     Marland Kitchen levothyroxine (SYNTHROID, LEVOTHROID) 112 MCG tablet Take 112 mcg by mouth daily before breakfast. BRAND NAME SYNTHROID    . oxyCODONE-acetaminophen (PERCOCET/ROXICET) 5-325 MG tablet Take 1 tablet by mouth every 4 (four) hours as needed. 30 tablet 0  . pregabalin (LYRICA) 150 MG capsule Take 150 mg by mouth in the morning and at bedtime.     . rosuvastatin (CRESTOR) 10 MG tablet Take 10 mg by mouth daily.     . Semaglutide, 1 MG/DOSE, (OZEMPIC, 1 MG/DOSE,) 2 MG/1.5ML SOPN Inject 0.5 mg into the skin every Friday. Takes in the evening    . sevelamer carbonate (RENVELA) 800 MG tablet Take 2,400 mg by mouth 3 (three) times daily.    Marland Kitchen sulfamethoxazole-trimethoprim (BACTRIM) 400-80 MG tablet Take 1 tablet by mouth 2 (two) times daily. 60 tablet 1  . zinc gluconate 50 MG tablet Take 50 mg by mouth daily.     No current facility-administered medications on file prior to visit.   Allergies  Allergen Reactions  . Mushroom Extract Complex Nausea Only   Social History   Socioeconomic History  . Marital status: Married    Spouse name: Not on file  . Number of children: 2  . Years of education: Not on file  . Highest education level: Not on file  Occupational History  . Not on file  Tobacco Use  . Smoking status: Former Smoker    Types: Cigars    Quit date: 12/24/1988    Years since quitting: 31.1  . Smokeless tobacco: Never Used  Vaping Use  . Vaping Use: Never used  Substance and Sexual Activity  . Alcohol use: No  . Drug use: No  . Sexual activity: Yes    Partners: Female  Other Topics Concern  . Not on file  Social History Narrative   Regular exercise: yes 3 times a week   Caffeine use: hot tea   Social Determinants of Health   Financial Resource Strain:   . Difficulty of Paying Living Expenses: Not on file  Food Insecurity:   . Worried About Charity fundraiser in the Last Year: Not on file  . Ran Out of Food in the Last Year: Not on file  Transportation  Needs:   . Lack of Transportation (Medical): Not on file  . Lack of Transportation (Non-Medical): Not on file  Physical Activity:   . Days of Exercise per Week: Not on file  . Minutes of Exercise per Session: Not on file  Stress:   . Feeling of Stress : Not on file  Social Connections:   . Frequency of Communication with Friends and Family: Not on file  . Frequency of Social Gatherings with Friends and Family: Not on file  . Attends Religious Services: Not on file  . Active Member of Clubs or Organizations: Not on file  . Attends Archivist Meetings: Not on file  . Marital Status: Not on file  Intimate Partner Violence:   . Fear of Current or Ex-Partner: Not on file  . Emotionally Abused: Not on file  . Physically Abused: Not on file  . Sexually Abused: Not on file   Vitals BP (!) 193/91   Pulse 78   Temp (!) 97.4 F (36.3 C) (Oral)    Examination  General - not in acute distress, comfortably sitting in chair HEENT - PEERLA, no pallor and no icterus Chest - b/l clear air entry, no additional sounds CVS-  Normal s1s2, RRR Abdomen - Soft, Non tender , non distended RT foot- suture in place, no tenderness/erythema/swelling    Neuro: grossly normal Back - WNL Psych : calm and cooperative  Recent labs CBC Latest Ref Rng & Units 01/07/2020 12/27/2019 08/27/2019  WBC 3.8 - 10.8 Thousand/uL - 6.0 -  Hemoglobin 13.0 - 17.0 g/dL 10.5(L) 10.8(L) 7.8(L)  Hematocrit 39 - 52 % 31.0(L) 33.9(L) 23.0(L)  Platelets 140 - 400 Thousand/uL - 180 -   CMP Latest Ref Rng & Units 01/07/2020 08/27/2019 05/20/2019  Glucose 70 - 99 mg/dL 93 144(H) 129(H)  BUN 8 - 23 mg/dL 33(H) 44(H) 32(H)  Creatinine 0.61 - 1.24 mg/dL 5.70(H) 8.80(H) 5.60(H)  Sodium 135 - 145 mmol/L 141 136 137  Potassium 3.5 - 5.1 mmol/L 4.2 3.7 3.4(L)  Chloride 98 - 111 mmol/L 100 96(L) 95(L)  CO2 22 - 32 mmol/L - - -  Calcium 8.9 - 10.3 mg/dL - - -  Total Protein 6.5 - 8.1 g/dL - - -  Total Bilirubin 0.3 - 1.2  mg/dL - - -  Alkaline Phos 38 - 126 U/L - - -  AST 15 - 41 U/L - - -  ALT 0 - 44 U/L - - -     Pertinent Microbiology Results for orders placed or performed during the hospital encounter of 01/07/20  SARS Coronavirus 2 by RT PCR (hospital order, performed in [facility] hospital lab) Nasopharyngeal Nasopharyngeal Swab     Status: None   Collection Time: 01/07/20 10:08 AM   Specimen: Nasopharyngeal Swab  Result Value Ref Range Status   SARS Coronavirus 2 NEGATIVE NEGATIVE Final    Comment: (NOTE) SARS-CoV-2 target nucleic acids are NOT DETECTED.  The SARS-CoV-2 RNA is generally detectable in upper and lower respiratory specimens during the acute phase of infection. The lowest concentration of SARS-CoV-2 viral copies this assay can detect is 250 copies / mL. A negative result does not preclude SARS-CoV-2 infection and should not be used as the sole basis for treatment or other patient management decisions.  A negative result may occur with improper specimen collection / handling, submission of specimen other than nasopharyngeal swab, presence of viral mutation(s) within the areas targeted by this assay, and inadequate number of viral copies (<250 copies / mL). A negative result must be combined with clinical observations, patient history, and epidemiological information.  Fact Sheet for Patients:   StrictlyIdeas.no  Fact Sheet for Healthcare Providers: BankingDealers.co.za  This test is not yet approved or  cleared by the [place] FDA and has been authorized for detection and/or diagnosis of SARS-CoV-2 by FDA under an Emergency Use Authorization (EUA).  This EUA will remain in effect (meaning this test can be used) for the duration of the COVID-19 declaration under Section 564(b)(1) of the Act, 21 U.S.C. section 360bbb-3(b)(1), unless the authorization is terminated or revoked sooner.  Performed at [facility],  [place] [address], [place], [place] [address]   Surgical pcr screen     Status: None   Collection Time: 01/07/20 10:55 AM   Specimen: Nasal Mucosa; Nasal Swab  Result Value Ref Range Status   MRSA, PCR NEGATIVE NEGATIVE Final   Staphylococcus aureus NEGATIVE NEGATIVE Final    Comment: (NOTE) The Xpert SA Assay (FDA approved for NASAL specimens in patients 52 years of age and older), is one component of a comprehensive surveillance program. It is not intended to diagnose infection nor to guide or monitor treatment. Performed at [facility],  [address] [place], [place] [address]   Aerobic/Anaerobic Culture (surgical/deep wound)     Status: None   Collection Time: 01/07/20  1:15 PM   Specimen: Bone; Tissue  Result Value Ref Range Status   Specimen Description TISSUE BONE  Final   Special Requests BONE TISSUE RIGHT FOOT SPEC A  Final   Gram Stain   Final    FEW WBC PRESENT, PREDOMINANTLY PMN NO ORGANISMS SEEN    Culture   Final    RARE STAPHYLOCOCCUS HAEMOLYTICUS RARE CORYNEBACTERIUM STRIATUM Standardized susceptibility testing for this organism is not available. RARE STENOTROPHOMONAS MALTOPHILIA CRITICAL RESULT CALLED TO, READ BACK BY AND VERIFIED WITH: DR DUDA VIA EPIC [phone] FCP NO ANAEROBES ISOLATED Performed at [facility], [place] [address] [place], [place] [address]    Report Status 01/15/2020 FINAL  Final   Organism ID, Bacteria STAPHYLOCOCCUS HAEMOLYTICUS  Final   Organism ID, Bacteria STENOTROPHOMONAS MALTOPHILIA  Final      Susceptibility   Stenotrophomonas maltophilia - MIC*    LEVOFLOXACIN 0.5 SENSITIVE Sensitive     TRIMETH/SULFA <=20 SENSITIVE Sensitive     * RARE STENOTROPHOMONAS MALTOPHILIA   Staphylococcus haemolyticus - MIC*    CIPROFLOXACIN >=8 RESISTANT Resistant     ERYTHROMYCIN >=8 RESISTANT Resistant     GENTAMICIN <=0.5 SENSITIVE Sensitive     OXACILLIN >=4 RESISTANT Resistant     TETRACYCLINE >=16 RESISTANT Resistant     VANCOMYCIN  <=0.5 SENSITIVE Sensitive     TRIMETH/SULFA >=320 RESISTANT Resistant     CLINDAMYCIN 1 INTERMEDIATE Intermediate     RIFAMPIN <=0.5 SENSITIVE Sensitive     Inducible Clindamycin NEGATIVE Sensitive     * RARE STAPHYLOCOCCUS HAEMOLYTICUS     All pertinent labs/Imagings/notes reviewed. All pertinent plain films and CT images have been personally visualized and interpreted; radiology reports have been reviewed. Decision making incorporated into the Impression / Recommendations.

## 2020-01-25 ENCOUNTER — Telehealth: Payer: Self-pay

## 2020-01-25 LAB — C-REACTIVE PROTEIN: CRP: 8.8 mg/L — ABNORMAL HIGH (ref <8.0)

## 2020-01-25 LAB — SEDIMENTATION RATE: Sed Rate: 34 mm/h — ABNORMAL HIGH (ref 0–20)

## 2020-01-25 NOTE — Telephone Encounter (Signed)
-----   Message from Rosiland Oz, MD sent at 01/24/2020  6:59 PM EDT ----- Regarding: LABS Please let HD center know that I need vanc trough weekly while he is on vancomycin with HD. Thanks

## 2020-01-25 NOTE — Telephone Encounter (Signed)
Thanks

## 2020-01-25 NOTE — Telephone Encounter (Signed)
Spoke with RN, Jocelyn Lamer regarding lab weekly vancomycin through orders. Verbal orders read back and understood. Results to be to [phone]. Eugenia Mcalpine

## 2020-01-27 ENCOUNTER — Ambulatory Visit (INDEPENDENT_AMBULATORY_CARE_PROVIDER_SITE_OTHER): Payer: Medicare Other | Admitting: Orthopedic Surgery

## 2020-01-27 ENCOUNTER — Encounter: Payer: Self-pay | Admitting: Orthopedic Surgery

## 2020-01-27 DIAGNOSIS — L97514 Non-pressure chronic ulcer of other part of right foot with necrosis of bone: Secondary | ICD-10-CM

## 2020-01-27 NOTE — Progress Notes (Signed)
Office Visit Note   Patient: [name]           Date of Birth: 1950-11-19           MRN: [id number] Visit Date: 01/27/2020              Requested by: Lorenda Hatchet, FNP [address] [place],  [place] [address] PCP: Lorenda Hatchet, FNP  Chief Complaint  Patient presents with  . Right Foot - Pain      HPI: Patient is a 69 year old gentleman who presents for follow-up status post excision of infected bone right foot and wound closure he is currently on IV antibiotics for 3 more weeks.  Patient is nonweightbearing using a kneeling scooter.  Assessment & Plan: Visit Diagnoses:  1. Ulcerated, foot, right, with necrosis of bone ([place])     Plan: We will harvest the sutures today the incision is well-healed continue nonweightbearing for 2 weeks follow-up in 2 weeks at which time we will evaluate for walking with his shoe and braces.  Follow-Up Instructions: Return in about 2 weeks (around 02/10/2020).   Ortho Exam  Patient is alert, oriented, no adenopathy, well-dressed, normal affect, normal respiratory effort. Examination the incision is well-healed there is no redness no cellulitis no swelling no fluctuance no tenderness to palpation.  Sutures are harvested today.  Imaging: No results found. No images are attached to the encounter.  Labs: Lab Results  Component Value Date   HGBA1C 6.3 (H) 12/14/2018   HGBA1C 6.0 (H) 12/24/2015   HGBA1C 7.0 (H) 12/25/2013   ESRSEDRATE 34 (H) 01/24/2020   ESRSEDRATE 41 (H) 01/10/2020   ESRSEDRATE 55 (H) 12/27/2019   CRP 8.8 (H) 01/24/2020   CRP 35.9 (H) 01/10/2020   CRP 77.9 (H) 12/27/2019   REPTSTATUS 01/15/2020 FINAL 01/07/2020   GRAMSTAIN  01/07/2020    FEW WBC PRESENT, PREDOMINANTLY PMN NO ORGANISMS SEEN    CULT  01/07/2020    RARE STAPHYLOCOCCUS HAEMOLYTICUS RARE CORYNEBACTERIUM STRIATUM Standardized susceptibility testing for this organism is not available. RARE STENOTROPHOMONAS MALTOPHILIA CRITICAL  RESULT CALLED TO, READ BACK BY AND VERIFIED WITH: DR Chardonay [place] VIA EPIC [phone] FCP NO ANAEROBES ISOLATED Performed at [facility], [place] [address], [place], [place] [address]    LABORGA STAPHYLOCOCCUS HAEMOLYTICUS 01/07/2020   LABORGA STENOTROPHOMONAS MALTOPHILIA 01/07/2020     Lab Results  Component Value Date   ALBUMIN 3.0 (L) 12/14/2018   ALBUMIN 3.7 12/24/2015   ALBUMIN 3.7 12/28/2014   PREALBUMIN 14.8 (L) 12/14/2018    No results found for: MG No results found for: VD25OH  Lab Results  Component Value Date   PREALBUMIN 14.8 (L) 12/14/2018   CBC EXTENDED Latest Ref Rng & Units 01/07/2020 12/27/2019 08/27/2019  WBC 3.8 - 10.8 Thousand/uL - 6.0 -  RBC 4.20 - 5.80 Million/uL - 3.85(L) -  HGB 13.0 - 17.0 g/dL 10.5(L) 10.8(L) 7.8(L)  HCT 39 - 52 % 31.0(L) 33.9(L) 23.0(L)  PLT 140 - 400 Thousand/uL - 180 -  NEUTROABS 1,500 - 7,800 cells/uL - - -  LYMPHSABS 850 - 3,900 cells/uL - - -     There is no height or weight on file to calculate BMI.  Orders:  No orders of the defined types were placed in this encounter.  No orders of the defined types were placed in this encounter.    Procedures: No procedures performed  Clinical Data: No additional findings.  ROS:  All other systems negative, except as noted  in the HPI. Review of Systems  Objective: Vital Signs: There were no vitals taken for this visit.  Specialty Comments:  No specialty comments available.  PMFS History: Patient Active Problem List   Diagnosis Date Noted  . History of partial ray amputation of fourth toe of right foot ([place]) 09/08/2019  . Ulcerated, foot, right, with necrosis of bone ([name])   . Chronic cough 06/25/2019  . Cutaneous abscess of right foot   . Subacute osteomyelitis of right foot ([place]) 12/14/2018  . AKI (acute kidney injury) ([place]) 12/14/2018  . CKD (chronic kidney disease) stage 4, GFR 15-29 ml/min (HCC) 12/14/2018  . Gait abnormality 08/24/2018  . Diabetic neuropathy  ([place]) 02/06/2018  . Cervical myelopathy ([place]) 02/06/2018  . Onychomycosis 10/30/2017  . Other spondylosis with radiculopathy, cervical region 01/27/2017  . Midfoot ulcer, right, limited to breakdown of skin ([place]) 11/15/2016  . Lateral epicondylitis, left elbow 08/12/2016  . Cellulitis of fifth toe of right foot 07/29/2016  . Prostate cancer ([place]) 06/19/2016  . Non-pressure chronic ulcer of other part of right foot limited to breakdown of skin ([place]) 04/04/2016  . Cellulitis of leg, right 12/24/2015  . Cellulitis of right lower extremity 12/24/2015  . CKD (chronic kidney disease), stage III ([place]) 12/25/2013  . Foot infection 12/24/2013  . Foot ulcer, left ([place]) 12/24/2013  . Diabetic foot ulcer ([place]) 09/14/2012  . Gout 09/14/2012  . HTN (hypertension) 09/14/2012  . Hypothyroidism 09/14/2012  . CAD (coronary artery disease) 09/14/2012  . Diabetes mellitus ([place]) 09/14/2012  . Hypercholesteremia   . Neuropathy [facility])    Past Medical History:  Diagnosis Date  . Ambulates with cane    straight cane  . Cervical myelopathy ([place]) 02/06/2018  . Chronic kidney disease    dailysis M W F- home  . Complication of anesthesia   . Coronary artery disease   . Diabetes mellitus without complication ([place])    type2  . Diabetic foot ulcer ([place])   . Diabetic foot ulcer ([place])   . Diabetic neuropathy ([place]) 02/06/2018  . Gait abnormality 08/24/2018  . GERD (gastroesophageal reflux disease)     01/06/20- not current  . Gout   . History of kidney stones    passed stones  . Hypercholesteremia   . Hypertension   . Hypothyroidism   . Neuromuscular disorder (HCC)    neuropathy left leg and bilateral feet  . Neuropathy   . PONV (postoperative nausea and vomiting)   . Prostate cancer [facility])     Family History  Problem Relation Age of Onset  . Diabetes Mellitus II Mother   . Diabetes Mellitus II Father   . CAD Father   . Cancer Father        prostate  . Diabetes Mellitus II Brother   . Diabetes  Mellitus II Brother   . Stomach cancer Brother 5  . Colon cancer Neg Hx   . Colon polyps Neg Hx   . Esophageal cancer Neg Hx   . Rectal cancer Neg Hx     Past Surgical History:  Procedure Laterality Date  . AMPUTATION Left 12/25/2013   Procedure: AMPUTATION RAY LEFT 5TH RAY;  Surgeon: [name], MD;  [facility];  Service: Orthopedics;  Laterality: Left;  . AMPUTATION Right 12/15/2018   Procedure: AMPUTATION OF 4TH AND 5TH TOES RIGHT FOOT;  Surgeon: [name], MD;  Location: [place];  Service: Orthopedics;  Laterality: Right;  . APPLICATION OF WOUND VAC Right 12/15/2018   Procedure:  APPLICATION OF WOUND VAC;  Surgeon: [name], MD;  Location: [place];  Service: Orthopedics;  Laterality: Right;  . AV FISTULA PLACEMENT Left 03/25/2019   Procedure: LEFT ARM ARTERIOVENOUS (AV) FISTULA CREATION;  Surgeon: [name], MD;  Location: [place];  Service: Vascular;  Laterality: Left;  . BACK SURGERY    . BASCILIC VEIN TRANSPOSITION Left 05/20/2019   Procedure: SECOND STAGE LEFT BASCILIC VEIN TRANSPOSITION;  Surgeon: [name], MD;  Location: [place];  Service: Vascular;  Laterality: Left;  . CARDIAC CATHETERIZATION  02/17/2014  . CHOLECYSTECTOMY    . COLONOSCOPY  2011   in [place], [place] GRAFT  2008  . I & D EXTREMITY Right 12/15/2018   Procedure: DEBRIDEMENT RIGHT FOOT;  Surgeon: [name], MD;  Location: [place];  Service: Orthopedics;  Laterality: Right;  . I & D EXTREMITY Right 08/27/2019   Procedure: PARTIAL CUBOID EXCISION RIGHT FOOT;  Surgeon: [name], MD;  Location: [place];  Service: Orthopedics;  Laterality: Right;  . I & D EXTREMITY Right 01/07/2020   Procedure: RIGHT FOOT EXCISION INFECTED BONE;  Surgeon: [name], MD;  Location: [place];  Service: Orthopedics;  Laterality: Right;  . NECK SURGERY     novemver 2019  . WISDOM TOOTH EXTRACTION     Social History   Occupational History  . Not on file  Tobacco Use  .  Smoking status: Former Smoker    Types: Cigars    Quit date: 12/24/1988    Years since quitting: 31.1  . Smokeless tobacco: Never Used  Vaping Use  . Vaping Use: Never used  Substance and Sexual Activity  . Alcohol use: No  . Drug use: No  . Sexual activity: Yes    Partners: Female

## 2020-01-27 NOTE — Progress Notes (Signed)
Thank you. I appreciate your plan.

## 2020-01-28 ENCOUNTER — Other Ambulatory Visit: Payer: Self-pay

## 2020-01-28 ENCOUNTER — Ambulatory Visit (INDEPENDENT_AMBULATORY_CARE_PROVIDER_SITE_OTHER): Payer: Medicare Other

## 2020-01-28 ENCOUNTER — Ambulatory Visit (INDEPENDENT_AMBULATORY_CARE_PROVIDER_SITE_OTHER): Payer: Medicare Other | Admitting: Pulmonary Disease

## 2020-01-28 ENCOUNTER — Encounter: Payer: Self-pay | Admitting: Pulmonary Disease

## 2020-01-28 VITALS — BP 132/78 | HR 77 | Temp 97.2°F

## 2020-01-28 DIAGNOSIS — N184 Chronic kidney disease, stage 4 (severe): Secondary | ICD-10-CM

## 2020-01-28 DIAGNOSIS — L97514 Non-pressure chronic ulcer of other part of right foot with necrosis of bone: Secondary | ICD-10-CM | POA: Diagnosis not present

## 2020-01-28 DIAGNOSIS — R053 Chronic cough: Secondary | ICD-10-CM

## 2020-01-28 DIAGNOSIS — R0602 Shortness of breath: Secondary | ICD-10-CM | POA: Insufficient documentation

## 2020-01-28 DIAGNOSIS — Z0000 Encounter for general adult medical examination without abnormal findings: Secondary | ICD-10-CM

## 2020-01-28 MED ORDER — BENZONATATE 100 MG PO CAPS: 100.0000 mg | ORAL_CAPSULE | Freq: Three times a day (TID) | ORAL | 0 refills | Status: DC

## 2020-01-28 MED ORDER — OMEPRAZOLE 40 MG PO CPDR: 40.0000 mg | DELAYED_RELEASE_CAPSULE | Freq: Every day | ORAL | 3 refills | Status: DC

## 2020-01-28 MED ORDER — FAMOTIDINE 20 MG PO TABS: 20.0000 mg | ORAL_TABLET | Freq: Every day | ORAL | 3 refills | Status: DC

## 2020-01-28 NOTE — Progress Notes (Signed)
@Patient  ID: [name], male    DOB: 03-12-1951, 69 y.o.   MRN: [id number]  Chief Complaint  Patient presents with  . Follow-up    Cough    Referring provider: Lorenda Hatchet, FNP  HPI:  69 year old male former smoker found our office for chronic cough  PMH: Neuropathy, gout, hypertension, hypothyroidism, CAD, diabetes, chronic kidney disease Smoker/ Smoking History: None former smoker Maintenance:   Pt of: Dr. Melvyn Novas  01/28/2020  - Visit   69 year old male former smoker followed in our office for chronic cough.  He is followed by Dr. Melvyn Novas.  He was last seen in our office in April/2021 by Dr. Melvyn Novas.  After March/2021 office visit on return to office in April/2021 patient's cough was better controlled with max GERD treatment.  He was recommended that time to try to stop taking Tessalon Perles and titrate this down.  Is recommended that he have follow-up in 6 weeks.  This was never completed.  Patient presenting to office today reporting that cough has started to return.  Tessalon Perles are helping.  He is currently being followed by Dr. [name] to he has been managed with IV antibiotics as well as oral antibiotics for an ulcerated foot with necrosis to the bone.  He was last seen by Dr. Jess Barters office on 01/27/2020.  Records are in the chart.  2-3 weeks  Dry cough  Worse at night Using tessalon pearles every night  Worsened shortness of breath at night   Stopped reflux meds in June     Questionaires / Pulmonary Flowsheets:   ACT:  No flowsheet data found.  MMRC: No flowsheet data found.  [facility]worth:  No flowsheet data found.  Tests:    Allergy profile 06/25/2019 >  Eos 0.2 /  IgE 10  RAST neg    06/26/2019-chest x-ray-no acute cardiopulmonary abnormality seen   FENO:  No results found for: NITRICOXIDE  PFT: No flowsheet data found.  WALK:  No flowsheet data found.  Imaging: XR Foot 2 Views Right  Result Date: 01/03/2020 2 view radiographs of the right  foot shows extensive destructive bony changes through the base of the fifth metatarsal extending to the cuboid.   Lab Results:  CBC    Component Value Date/Time   WBC 6.0 12/27/2019 1643   RBC 3.85 (L) 12/27/2019 1643   HGB 10.5 (L) 01/07/2020 1048   HCT 31.0 (L) 01/07/2020 1048   PLT 180 12/27/2019 1643   MCV 88.1 12/27/2019 1643   MCH 28.1 12/27/2019 1643   MCHC 31.9 (L) 12/27/2019 1643   RDW 15.7 (H) 12/27/2019 1643   LYMPHSABS 780 (L) 06/25/2019 1615   MONOABS 0.4 12/18/2018 0243   EOSABS 221 06/25/2019 1615   BASOSABS 90 06/25/2019 1615    BMET    Component Value Date/Time   NA 141 01/07/2020 1048   K 4.2 01/07/2020 1048   CL 100 01/07/2020 1048   CO2 17 (L) 12/18/2018 0243   GLUCOSE 93 01/07/2020 1048   BUN 33 (H) 01/07/2020 1048   CREATININE 5.70 (H) 01/07/2020 1048   CALCIUM 7.8 (L) 12/18/2018 0243   GFRNONAA 7 (L) 12/18/2018 0243   GFRAA 8 (L) 12/18/2018 0243    BNP No results found for: BNP  ProBNP No results found for: PROBNP  Specialty Problems      Pulmonary Problems   Chronic cough    Onset around 1st of 2021  - Allergy profile 06/25/2019 >  Eos 0.2 /  IgE 10  RAST neg  - max gerd rx 06/25/2019 > much better 07/23/2019 rec taper tessalon down/ off      Shortness of breath      Allergies  Allergen Reactions  . Mushroom Extract Complex Nausea Only    Immunization History  Administered Date(s) Administered  . Hepatitis B 07/21/2019, 08/24/2019  . Hepatitis B, adult 07/21/2019, 08/24/2019  . Hepb-cpg 12/16/2019  . PFIZER SARS-COV-2 Vaccination 05/13/2019, 06/08/2019  . Pneumococcal Conjugate-13 08/10/2019, 08/10/2019    Past Medical History:  Diagnosis Date  . Ambulates with cane    straight cane  . Cervical myelopathy ([place]) 02/06/2018  . Chronic kidney disease    dailysis M W F- home  . Complication of anesthesia   . Coronary artery disease   . Diabetes mellitus without complication ([place])    type2  . Diabetic foot ulcer ([place])     . Diabetic foot ulcer ([place])   . Diabetic neuropathy ([place]) 02/06/2018  . Gait abnormality 08/24/2018  . GERD (gastroesophageal reflux disease)     01/06/20- not current  . Gout   . History of kidney stones    passed stones  . Hypercholesteremia   . Hypertension   . Hypothyroidism   . Neuromuscular disorder ([facility])    neuropathy left leg and bilateral feet  . Neuropathy   . PONV (postoperative nausea and vomiting)   . Prostate cancer ([place])     Tobacco History: Social History   Tobacco Use  Smoking Status Former Smoker  . Years: 25.00  . Types: Cigars  . Start date: 04/08/1973  . Quit date: 12/24/1988  . Years since quitting: 31.1  Smokeless Tobacco Never Used  Tobacco Comment   Cigars and Pipe    Counseling given: Yes Comment: Cigars and Pipe    Continue to not smoke  Outpatient Encounter Medications as of 01/28/2020  Medication Sig  . allopurinol (ZYLOPRIM) 100 MG tablet Take 200 mg by mouth daily.   Marland Kitchen aspirin EC 81 MG tablet Take 81 mg by mouth every morning.   . B Complex-C-Zn-Folic Acid (DIALYVITE 161 WITH ZINC) 0.8 MG TABS Take 1 tablet by mouth daily.  . benzonatate (TESSALON) 100 MG capsule Take 1 capsule (100 mg total) by mouth 3 (three) times daily.  . calcitRIOL (ROCALTROL) 0.5 MCG capsule Take 0.5 mcg by mouth 3 (three) times a week.  . carvedilol (COREG) 6.25 MG tablet Take 6.25 mg by mouth 2 (two) times daily.  . chlorhexidine (PERIDEX) 0.12 % solution 15 mLs by Mouth Rinse route daily.  . diclofenac Sodium (VOLTAREN) 1 % GEL Apply 1 application topically daily as needed (pain).   Marland Kitchen HYDROcodone-acetaminophen (NORCO/VICODIN) 5-325 MG tablet Take 1 tablet by mouth every 6 (six) hours as needed.  . Ibuprofen-Acetaminophen (ADVIL DUAL ACTION) 125-250 MG TABS Take 4 tablets by mouth daily as needed (Pain).  . insulin degludec (TRESIBA FLEXTOUCH) 200 UNIT/ML FlexTouch Pen Inject 10 Units into the skin at bedtime.   . Lancets (ONETOUCH DELICA PLUS [id number]) MISC   .  levothyroxine (SYNTHROID, LEVOTHROID) 112 MCG tablet Take 112 mcg by mouth daily before breakfast. BRAND NAME SYNTHROID  . Methoxy PEG-Epoetin Beta (MIRCERA IJ) Inject into the skin.  Marland Kitchen oxyCODONE-acetaminophen (PERCOCET/ROXICET) 5-325 MG tablet Take 1 tablet by mouth every 4 (four) hours as needed.  . pregabalin (LYRICA) 150 MG capsule Take 150 mg by mouth in the morning and at bedtime.   . rosuvastatin (CRESTOR) 10 MG tablet Take 10 mg by mouth daily.   . Semaglutide, 1  MG/DOSE, (OZEMPIC, 1 MG/DOSE,) 2 MG/1.5ML SOPN Inject 0.5 mg into the skin every Friday. Takes in the evening  . sevelamer carbonate (RENVELA) 800 MG tablet Take 2,400 mg by mouth 3 (three) times daily.  Marland Kitchen sulfamethoxazole-trimethoprim (BACTRIM) 400-80 MG tablet Take 1 tablet by mouth 2 (two) times daily.  Marland Kitchen sulfamethoxazole-trimethoprim (BACTRIM) 400-80 MG tablet Take 1 tablet by mouth 3 (three) times daily.  Marland Kitchen zinc gluconate 50 MG tablet Take 50 mg by mouth daily.  . [DISCONTINUED] benzonatate (TESSALON) 100 MG capsule Take 100 mg by mouth 3 (three) times daily.  . famotidine (PEPCID) 20 MG tablet Take 1 tablet (20 mg total) by mouth at bedtime.  Marland Kitchen omeprazole (PRILOSEC) 40 MG capsule Take 1 capsule (40 mg total) by mouth daily.   No facility-administered encounter medications on file as of 01/28/2020.     Review of Systems  Review of Systems  Constitutional: Negative for activity change, chills, fatigue, fever and unexpected weight change.  HENT: Positive for congestion. Negative for postnasal drip, rhinorrhea, sinus pressure, sinus pain and sore throat.   Eyes: Negative.   Respiratory: Positive for cough and shortness of breath (at night). Negative for wheezing.   Cardiovascular: Negative for chest pain and palpitations.  Gastrointestinal: Negative for constipation, diarrhea, nausea and vomiting.  Endocrine: Negative.   Genitourinary: Negative.   Musculoskeletal: Negative.   Skin: Negative.   Neurological: Negative  for dizziness and headaches.  Psychiatric/Behavioral: Negative.  Negative for dysphoric mood. The patient is not nervous/anxious.   All other systems reviewed and are negative.    Physical Exam  BP 132/78 (BP Location: Left Arm, Cuff Size: Normal)   Pulse 77   Temp (!) 97.2 F (36.2 C) (Oral)   SpO2 98%   Wt Readings from Last 5 Encounters:  01/20/20 205 lb (93 kg)  01/13/20 205 lb (93 kg)  01/11/20 205 lb (93 kg)  01/07/20 205 lb (93 kg)  01/03/20 208 lb (94.3 kg)    BMI Readings from Last 5 Encounters:  01/20/20 29.41 kg/m  01/13/20 29.41 kg/m  01/11/20 29.41 kg/m  01/07/20 29.41 kg/m  01/03/20 29.84 kg/m     Physical Exam Vitals and nursing note reviewed.  Constitutional:      General: He is not in acute distress.    Appearance: Normal appearance. He is obese.  HENT:     Head: Normocephalic and atraumatic.     Right Ear: Hearing, tympanic membrane, ear canal and external ear normal. There is no impacted cerumen.     Left Ear: Hearing, tympanic membrane, ear canal and external ear normal. There is no impacted cerumen.     Nose: Rhinorrhea present. No mucosal edema.     Right Turbinates: Not enlarged.     Left Turbinates: Not enlarged.     Mouth/Throat:     Mouth: Mucous membranes are dry.     Pharynx: Oropharynx is clear. No oropharyngeal exudate.     Comments: +PND Eyes:     Pupils: Pupils are equal, round, and reactive to light.  Cardiovascular:     Rate and Rhythm: Normal rate and regular rhythm.     Pulses: Normal pulses.     Heart sounds: Normal heart sounds. No murmur heard.   Pulmonary:     Effort: Pulmonary effort is normal.     Breath sounds: Normal breath sounds. No decreased breath sounds, wheezing or rales.  Musculoskeletal:     Cervical back: Normal range of motion.     Right lower leg:  1+ Edema present.     Left lower leg: 1+ Edema present.       Legs:  Lymphadenopathy:     Cervical: No cervical adenopathy.  Skin:    General: Skin  is warm and dry.     Capillary Refill: Capillary refill takes less than 2 seconds.     Findings: No erythema or rash.  Neurological:     General: No focal deficit present.     Mental Status: He is alert and oriented to person, place, and time.     Motor: No weakness.     Coordination: Coordination normal.     Gait: Gait is intact. Gait normal.  Psychiatric:        Mood and Affect: Mood normal.        Behavior: Behavior normal. Behavior is cooperative.        Thought Content: Thought content normal.        Judgment: Judgment normal.       Assessment & Plan:   Chronic cough Cough returned 2 to 3 weeks ago Postnasal drip and rhinorrhea on exam Cough was previously responsive to GERD treatments, patient stopped taking acid reflux medications due to resolution of cough in June/2021 Suspect patient may have aspect of diabetic gastroparesis given severity of diabetic neuropathy, this may be leading to silent reflux  Plan: Restart omeprazole 40 mg daily in the morning Restart Pepcid 20 mg in the evening Can continue to use Tessalon Perles If postnasal drip or rhinorrhea worsens can start taking daily antihistamine 8-week follow-up in office to ensure the cough has improved Given 25 years of smoking history with pipe and cigar smoke may need to consider pulmonary function test if shortness of breath does not improve with resolution of cough Provided GERD diet and lifestyle recommendations on AVS  Healthcare maintenance Patient will receive flu vaccine from dialysis center He reports that he is up-to-date with his pneumonia vaccines We will request records from dialysis center  Ulcerated, foot, right, with necrosis of bone ([place]) Plan: Continue medications as managed by Dr. [name] Continue follow-up with Dr. [name]  Shortness of breath 25-year smoker with cigars and pipes Never had breathing test performed Patient reporting worsening shortness of breath over the last 2 to 3 weeks  with cough onset  Plan: Chest x-ray today May need to consider pulmonary function testing if shortness of breath persist despite cough improvement  CKD (chronic kidney disease) stage 4, GFR 15-29 ml/min [facility]) Plan: Continue Monday Wednesday Friday dialysis    Return in about 2 months (around 03/29/2020), or if symptoms worsen or fail to improve, for Follow up with Dr. Melvyn Novas.   Lauraine Rinne, NP 01/28/2020   This appointment required 32 minutes of patient care (this includes precharting, chart review, review of results, face-to-face care, etc.).

## 2020-01-28 NOTE — Assessment & Plan Note (Signed)
Patient will receive flu vaccine from dialysis center He reports that he is up-to-date with his pneumonia vaccines We will request records from dialysis center

## 2020-01-28 NOTE — Assessment & Plan Note (Signed)
Cough returned 2 to 3 weeks ago Postnasal drip and rhinorrhea on exam Cough was previously responsive to GERD treatments, patient stopped taking acid reflux medications due to resolution of cough in June/2021 Suspect patient may have aspect of diabetic gastroparesis given severity of diabetic neuropathy, this may be leading to silent reflux  Plan: Restart omeprazole 40 mg daily in the morning Restart Pepcid 20 mg in the evening Can continue to use Tessalon Perles If postnasal drip or rhinorrhea worsens can start taking daily antihistamine 8-week follow-up in office to ensure the cough has improved Given 25 years of smoking history with pipe and cigar smoke may need to consider pulmonary function test if shortness of breath does not improve with resolution of cough Provided GERD diet and lifestyle recommendations on AVS

## 2020-01-28 NOTE — Assessment & Plan Note (Signed)
Plan: Continue medications as managed by Dr. [name] Continue follow-up with Dr. [name]

## 2020-01-28 NOTE — Patient Instructions (Addendum)
You were seen today by Lauraine Rinne, NP  for:   1. Chronic cough  - omeprazole (PRILOSEC) 40 MG capsule; Take 1 capsule (40 mg total) by mouth daily.  Dispense: 30 capsule; Refill: 3 - famotidine (PEPCID) 20 MG tablet; Take 1 tablet (20 mg total) by mouth at bedtime.  Dispense: 30 tablet; Refill: 3 - DG Chest 2 View; Future  Restart omeprazole 40 mg tablet  >>>Please take 1 tablet daily 15 minutes to 30 minutes before your first meal of the day as well as before your other medications >>>Try to take at the same time each day >>>take this medication daily  Restart Pepcid 20 mg at night  GERD management: >>>Avoid laying flat until 2 hours after meals >>>Elevate head of the bed including entire chest >>>Reduce size of meals and amount of fat, acid, spices, caffeine and sweets >>>If you are smoking, Please stop! >>>Decrease alcohol consumption >>>Work on maintaining a healthy weight with normal BMI   Cough Home Instructions:  We believe you have a chronic/cyclical cough that is aggravated by reflux , coughing , and drainage.  . Goal is to not Cough or clear throat.  Marland Kitchen Avoid coughing or clearing throat by using:  o non-mint products/sugarless candy o Water o ice chips o Remember NO MINT PRODUCTS  . Medications to use:  o Mucinex DM 1-2 every 12 hrs or Delsym 2 tsp every 12 hrs for cough (These are Over the counter) o Tessalon Three times a day  As needed  Cough.  o Prilosec 40mg  30 min before breakfast or dinner.  o Pepcid 20mg  before dinner  o Zyrtec 10mg  at bedtime (Can use generic, this is over the counter) o Chlor tabs 4mg  2 at bedtime  for nasal drip until cough is 100% cough free. (this medication is over the counter)     We recommend today:  Orders Placed This Encounter  Procedures  . DG Chest 2 View    Standing Status:   Future    Standing Expiration Date:   05/30/2020    Order Specific Question:   Reason for Exam (SYMPTOM  OR DIAGNOSIS REQUIRED)    Answer:   cough     Order Specific Question:   Preferred imaging location?    Answer:   Internal    Order Specific Question:   Radiology Contrast Protocol - do NOT remove file path    Answer:   [email or web address]   Orders Placed This Encounter  Procedures  . DG Chest 2 View   Meds ordered this encounter  Medications  . omeprazole (PRILOSEC) 40 MG capsule    Sig: Take 1 capsule (40 mg total) by mouth daily.    Dispense:  30 capsule    Refill:  3  . famotidine (PEPCID) 20 MG tablet    Sig: Take 1 tablet (20 mg total) by mouth at bedtime.    Dispense:  30 tablet    Refill:  3    Follow Up:    Return in about 2 months (around 03/29/2020), or if symptoms worsen or fail to improve, for Follow up with Dr. Melvyn Novas.   Notification of test results are managed in the following manner: If there are  any recommendations or changes to the  plan of care discussed in office today,  we will contact you and let you know what they are. If you do not hear from [place], then your results are normal and you can view them through your  MyChart account ,  or a letter will be sent to you. Thank you again for trusting [place] with your care  - Thank you, [facility]    It is flu season:   >>> Best ways to protect herself from the flu: Receive the yearly flu vaccine, practice good hand hygiene washing with soap and also using hand sanitizer when available, eat a nutritious meals, get adequate rest, hydrate appropriately       Please contact the office if your symptoms worsen or you have concerns that you are not improving.   Thank you for choosing [facility] for your healthcare, and for allowing [place] to partner with you on your healthcare journey. I am thankful to be able to provide care to you today.   Wyn Quaker FNP-C    Gastroesophageal Reflux Disease, Adult Gastroesophageal reflux (GER) happens when acid from the stomach flows up into the tube that  connects the mouth and the stomach (esophagus). Normally, food travels down the esophagus and stays in the stomach to be digested. With GER, food and stomach acid sometimes move back up into the esophagus. You may have a disease called gastroesophageal reflux disease (GERD) if the reflux:  Happens often.  Causes frequent or very bad symptoms.  Causes problems such as damage to the esophagus. When this happens, the esophagus becomes sore and swollen (inflamed). Over time, GERD can make small holes (ulcers) in the lining of the esophagus. What are the causes? This condition is caused by a problem with the muscle between the esophagus and the stomach. When this muscle is weak or not normal, it does not close properly to keep food and acid from coming back up from the stomach. The muscle can be weak because of:  Tobacco use.  Pregnancy.  Having a certain type of hernia (hiatal hernia).  Alcohol use.  Certain foods and drinks, such as coffee, chocolate, onions, and peppermint. What increases the risk? You are more likely to develop this condition if you:  Are overweight.  Have a disease that affects your connective tissue.  Use NSAID medicines. What are the signs or symptoms? Symptoms of this condition include:  Heartburn.  Difficult or painful swallowing.  The feeling of having a lump in the throat.  A bitter taste in the mouth.  Bad breath.  Having a lot of saliva.  Having an upset or bloated stomach.  Belching.  Chest pain. Different conditions can cause chest pain. Make sure you see your doctor if you have chest pain.  Shortness of breath or noisy breathing (wheezing).  Ongoing (chronic) cough or a cough at night.  Wearing away of the surface of teeth (tooth enamel).  Weight loss. How is this treated? Treatment will depend on how bad your symptoms are. Your doctor may suggest:  Changes to your diet.  Medicine.  Surgery. Follow these instructions at  home: Eating and drinking   Follow a diet as told by your doctor. You may need to avoid foods and drinks such as: ? Coffee and tea (with or without caffeine). ? Drinks that contain alcohol. ? Energy drinks and sports drinks. ? Bubbly (carbonated) drinks or sodas. ? Chocolate and cocoa. ? Peppermint and mint flavorings. ? Garlic and onions. ? Horseradish. ? Spicy and acidic foods. These include peppers, chili powder, curry powder, vinegar, hot sauces, and BBQ sauce. ? Citrus fruit juices and citrus fruits, such as oranges, lemons, and limes. ? Tomato-based foods. These include red sauce, chili, salsa, and pizza with red sauce. ?  Fried and fatty foods. These include donuts, french fries, potato chips, and high-fat dressings. ? High-fat meats. These include hot dogs, rib eye steak, sausage, ham, and bacon. ? High-fat dairy items, such as whole milk, butter, and cream cheese.  Eat small meals often. Avoid eating large meals.  Avoid drinking large amounts of liquid with your meals.  Avoid eating meals during the 2-3 hours before bedtime.  Avoid lying down right after you eat.  Do not exercise right after you eat. Lifestyle   Do not use any products that contain nicotine or tobacco. These include cigarettes, e-cigarettes, and chewing tobacco. If you need help quitting, ask your doctor.  Try to lower your stress. If you need help doing this, ask your doctor.  If you are overweight, lose an amount of weight that is healthy for you. Ask your doctor about a safe weight loss goal. General instructions  Pay attention to any changes in your symptoms.  Take over-the-counter and prescription medicines only as told by your doctor. Do not take aspirin, ibuprofen, or other NSAIDs unless your doctor says it is okay.  Wear loose clothes. Do not wear anything tight around your waist.  Raise (elevate) the head of your bed about 6 inches (15 cm).  Avoid bending over if this makes your  symptoms worse.  Keep all follow-up visits as told by your doctor. This is important. Contact a doctor if:  You have new symptoms.  You lose weight and you do not know why.  You have trouble swallowing or it hurts to swallow.  You have wheezing or a cough that keeps happening.  Your symptoms do not get better with treatment.  You have a hoarse voice. Get help right away if:  You have pain in your arms, neck, jaw, teeth, or back.  You feel sweaty, dizzy, or light-headed.  You have chest pain or shortness of breath.  You throw up (vomit) and your throw-up looks like blood or coffee grounds.  You pass out (faint).  Your poop (stool) is bloody or black.  You cannot swallow, drink, or eat. Summary  If a person has gastroesophageal reflux disease (GERD), food and stomach acid move back up into the esophagus and cause symptoms or problems such as damage to the esophagus.  Treatment will depend on how bad your symptoms are.  Follow a diet as told by your doctor.  Take all medicines only as told by your doctor. This information is not intended to replace advice given to you by your health care provider. Make sure you discuss any questions you have with your health care provider. Document Revised: 10/01/2017 Document Reviewed: 10/01/2017 Elsevier Patient Education  2020 [place] for Gastroesophageal Reflux Disease, Adult When you have gastroesophageal reflux disease (GERD), the foods you eat and your eating habits are very important. Choosing the right foods can help ease your discomfort. Think about working with a nutrition specialist (dietitian) to help you make good choices. What are tips for following this plan?  Meals  Choose healthy foods that are low in fat, such as fruits, vegetables, whole grains, low-fat dairy products, and lean meat, fish, and poultry.  Eat small meals often instead of 3 large meals a day. Eat your meals slowly, and in a  place where you are relaxed. Avoid bending over or lying down until 2-3 hours after eating.  Avoid eating meals 2-3 hours before bed.  Avoid drinking a lot of liquid with meals.  Cook foods using methods other than frying. Bake, grill, or broil food instead.  Avoid or limit: ? Chocolate. ? Peppermint or spearmint. ? Alcohol. ? Pepper. ? Black and decaffeinated coffee. ? Black and decaffeinated tea. ? Bubbly (carbonated) soft drinks. ? Caffeinated energy drinks and soft drinks.  Limit high-fat foods such as: ? Fatty meat or fried foods. ? Whole milk, cream, butter, or ice cream. ? Nuts and nut butters. ? Pastries, donuts, and sweets made with butter or shortening.  Avoid foods that cause symptoms. These foods may be different for everyone. Common foods that cause symptoms include: ? Tomatoes. ? Oranges, lemons, and limes. ? Peppers. ? Spicy food. ? Onions and garlic. ? Vinegar. Lifestyle  Maintain a healthy weight. Ask your doctor what weight is healthy for you. If you need to lose weight, work with your doctor to do so safely.  Exercise for at least 30 minutes for 5 or more days each week, or as told by your doctor.  Wear loose-fitting clothes.  Do not smoke. If you need help quitting, ask your doctor.  Sleep with the head of your bed higher than your feet. Use a wedge under the mattress or blocks under the bed frame to raise the head of the bed. Summary  When you have gastroesophageal reflux disease (GERD), food and lifestyle choices are very important in easing your symptoms.  Eat small meals often instead of 3 large meals a day. Eat your meals slowly, and in a place where you are relaxed.  Limit high-fat foods such as fatty meat or fried foods.  Avoid bending over or lying down until 2-3 hours after eating.  Avoid peppermint and spearmint, caffeine, alcohol, and chocolate. This information is not intended to replace advice given to you by your health care  provider. Make sure you discuss any questions you have with your health care provider. Document Revised: 07/16/2018 Document Reviewed: 04/30/2016 Elsevier Patient Education  [place].

## 2020-01-28 NOTE — Assessment & Plan Note (Signed)
Plan: Continue Monday Wednesday Friday dialysis

## 2020-01-28 NOTE — Assessment & Plan Note (Signed)
25-year smoker with cigars and pipes Never had breathing test performed Patient reporting worsening shortness of breath over the last 2 to 3 weeks with cough onset  Plan: Chest x-ray today May need to consider pulmonary function testing if shortness of breath persist despite cough improvement

## 2020-02-09 ENCOUNTER — Telehealth: Payer: Self-pay | Admitting: Internal Medicine

## 2020-02-09 NOTE — Telephone Encounter (Signed)
Called and spoke with patient, he is requesting a refill of the Tessalon pearles, states he still has the cough at night.  He ends up taking 2 of the 100 mg tablets, he is requesting the higher mg dose of the tessalon.  He denies any fever, chills or body aches.  He took a home covid test and it was negative.  He was confused regarding the Pepcid and Prilosec.  We reviewed which one his to take at what time of day.  He verbalized understanding.  Dr. Melvyn Novas, please advise if it is ok to send in a script for the tessalon pearles 200mg .

## 2020-02-09 NOTE — Telephone Encounter (Signed)
Ok for tessalon 200  Up to tid   #45 but needs ov with all meds in hand before we can refill this again as it means the other meds didn't fix the underlying problem

## 2020-02-10 ENCOUNTER — Encounter: Payer: Self-pay | Admitting: Orthopedic Surgery

## 2020-02-10 ENCOUNTER — Ambulatory Visit (INDEPENDENT_AMBULATORY_CARE_PROVIDER_SITE_OTHER): Payer: Medicare Other | Admitting: Orthopedic Surgery

## 2020-02-10 ENCOUNTER — Other Ambulatory Visit: Payer: Self-pay | Admitting: Pulmonary Disease

## 2020-02-10 VITALS — Ht 70.0 in | Wt 205.0 lb

## 2020-02-10 DIAGNOSIS — Z89421 Acquired absence of other right toe(s): Secondary | ICD-10-CM

## 2020-02-10 MED ORDER — BENZONATATE 200 MG PO CAPS: 200.0000 mg | ORAL_CAPSULE | Freq: Three times a day (TID) | ORAL | 0 refills | Status: DC | PRN

## 2020-02-10 NOTE — Telephone Encounter (Signed)
Spoke with the pt and notified of response per MW  He does want a sooner appt than the one he has in Jan 2022  I have scheduled him for 03/16/20  Rx refill sent x 1 only

## 2020-02-10 NOTE — Telephone Encounter (Addendum)
Pt is requesting refill on BENZONATATE 100 MG CAPSULE next ov 04/15/19.pt has already pick up from pharmacy nothing further needed

## 2020-02-10 NOTE — Progress Notes (Signed)
Office Visit Note   Patient: [name]           Date of Birth: [birth year]           MRN: [id number] Visit Date: 02/10/2020              Requested by: Lorenda Hatchet, [place] [address] [place],  [place] [address] PCP: Lorenda Hatchet, FNP  Chief Complaint  Patient presents with  . Right Foot - Routine Post Op    01/07/20 right foot excision bone       HPI: Patient is a 68 year old gentleman who presents in follow-up status post further excision of the lateral column right foot he has been nonweightbearing in a postoperative shoe patient feels well he feels like this has healed nicely.  Assessment & Plan: Visit Diagnoses:  1. 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  He will check his foot daily increase activity slowly.  Follow-Up Instructions: Return in about 2 weeks (around 02/24/2020).   Ortho Exam  Patient is alert, oriented, no adenopathy, well-dressed, normal affect, normal respiratory effort. Examination patient surgical incision is well-healed there is no redness no cellulitis no drainage no signs of infection.  There is a small scab about 3 x 5 mm 0.1 mm deep.  Imaging: No results found. No images are attached to the encounter.  Labs: Lab Results  Component Value Date   HGBA1C 6.3 (H) 12/14/2018   HGBA1C 6.0 (H) 12/24/2015   HGBA1C 7.0 (H) 12/25/2013   ESRSEDRATE 34 (H) 01/24/2020   ESRSEDRATE 41 (H) 01/10/2020   ESRSEDRATE 55 (H) 12/27/2019   CRP 8.8 (H) 01/24/2020   CRP 35.9 (H) 01/10/2020   CRP 77.9 (H) 12/27/2019   REPTSTATUS 01/15/2020 FINAL 01/07/2020   GRAMSTAIN  01/07/2020    FEW WBC PRESENT, PREDOMINANTLY PMN NO ORGANISMS SEEN    CULT  01/07/2020    RARE STAPHYLOCOCCUS HAEMOLYTICUS RARE CORYNEBACTERIUM STRIATUM Standardized susceptibility testing for this organism is not available. RARE  STENOTROPHOMONAS MALTOPHILIA CRITICAL RESULT CALLED TO, READ BACK BY AND VERIFIED WITH: DR Daeveon Zweber VIA EPIC [id number] FCP NO ANAEROBES ISOLATED Performed at [name], [place] [address], [place], [place] [address]    LABORGA STAPHYLOCOCCUS HAEMOLYTICUS 01/07/2020   LABORGA STENOTROPHOMONAS MALTOPHILIA 01/07/2020     Lab Results  Component Value Date   ALBUMIN 3.0 (L) 12/14/2018   ALBUMIN 3.7 12/24/2015   ALBUMIN 3.7 12/28/2014   PREALBUMIN 14.8 (L) 12/14/2018    No results found for: MG No results found for: VD25OH  Lab Results  Component Value Date   PREALBUMIN 14.8 (L) 12/14/2018   CBC EXTENDED Latest Ref Rng & Units 01/07/2020 12/27/2019 08/27/2019  WBC 3.8 - 10.8 Thousand/uL - 6.0 -  RBC 4.20 - 5.80 Million/uL - 3.85(L) -  HGB 13.0 - 17.0 g/dL 10.5(L) 10.8(L) 7.8(L)  HCT 39 - 52 % 31.0(L) 33.9(L) 23.0(L)  PLT 140 - 400 Thousand/uL - 180 -  NEUTROABS 1,500 - 7,800 cells/uL - - -  LYMPHSABS 850 - 3,900 cells/uL - - -     Body mass index is 29.41 kg/m.  Orders:  No orders of the defined types were placed in this encounter.  No orders of the defined types were placed in this encounter.    Procedures: No procedures performed  Clinical Data: No additional findings.  ROS:  All other systems negative, except as noted in the HPI. Review of Systems  Objective: Vital Signs: Ht 5\' 10"  (1.778 m)   Wt 205 lb (93 kg)   BMI 29.41 kg/m   Specialty Comments:  No specialty comments available.  PMFS History: Patient Active Problem List   Diagnosis Date Noted  . Healthcare maintenance 01/28/2020  . Shortness of breath 01/28/2020  . History of partial ray amputation of fourth toe of right foot ([place]) 09/08/2019  . Ulcerated, foot, right, with necrosis of bone ([place])   . Chronic cough 06/25/2019  . Cutaneous abscess of right foot   . Subacute osteomyelitis of right foot ([place]) 12/14/2018  . AKI (acute kidney injury) ([place]) 12/14/2018  . CKD (chronic kidney  disease) stage 4, GFR 15-29 ml/min (HCC) 12/14/2018  . Gait abnormality 08/24/2018  . Diabetic neuropathy ([place]) 02/06/2018  . Cervical myelopathy ([place]) 02/06/2018  . Onychomycosis 10/30/2017  . Other spondylosis with radiculopathy, cervical region 01/27/2017  . Midfoot ulcer, right, limited to breakdown of skin ([place]) 11/15/2016  . Lateral epicondylitis, left elbow 08/12/2016  . Cellulitis of fifth toe of right foot 07/29/2016  . Prostate cancer ([place]) 06/19/2016  . Non-pressure chronic ulcer of other part of right foot limited to breakdown of skin ([place]) 04/04/2016  . Cellulitis of leg, right 12/24/2015  . Cellulitis of right lower extremity 12/24/2015  . CKD (chronic kidney disease), stage III ([place]) 12/25/2013  . Foot infection 12/24/2013  . Foot ulcer, left ([place]) 12/24/2013  . Diabetic foot ulcer ([place]) 09/14/2012  . Gout 09/14/2012  . HTN (hypertension) 09/14/2012  . Hypothyroidism 09/14/2012  . CAD (coronary artery disease) 09/14/2012  . Diabetes mellitus ([place]) 09/14/2012  . Hypercholesteremia   . Neuropathy [facility])    Past Medical History:  Diagnosis Date  . Ambulates with cane    straight cane  . Cervical myelopathy ([place]) 02/06/2018  . Chronic kidney disease    dailysis [facility]- home  . Complication of anesthesia   . Coronary artery disease   . Diabetes mellitus without complication ([place])    type2  . Diabetic foot ulcer ([place])   . Diabetic foot ulcer ([place])   . Diabetic neuropathy ([place]) 02/06/2018  . Gait abnormality 08/24/2018  . GERD (gastroesophageal reflux disease)     01/06/20- not current  . Gout   . History of kidney stones    passed stones  . Hypercholesteremia   . Hypertension   . Hypothyroidism   . Neuromuscular disorder (HCC)    neuropathy left leg and bilateral feet  . Neuropathy   . PONV (postoperative nausea and vomiting)   . Prostate cancer [facility])     Family History  Problem Relation Age of Onset  . Diabetes Mellitus II Mother   . Diabetes Mellitus II  Father   . CAD Father   . Cancer Father        prostate  . Diabetes Mellitus II Brother   . Diabetes Mellitus II Brother   . Stomach cancer Brother 93  . Colon cancer Neg Hx   . Colon polyps Neg Hx   . Esophageal cancer Neg Hx   . Rectal cancer Neg Hx     Past Surgical History:  Procedure Laterality Date  . AMPUTATION Left 12/25/2013   Procedure: AMPUTATION RAY LEFT 5TH RAY;  Surgeon: [name], MD;  Location: [facility];  Service: Orthopedics;  Laterality: Left;  . AMPUTATION Right 12/15/2018   Procedure: AMPUTATION OF 4TH AND 5TH TOES RIGHT  FOOT;  Surgeon: [name], MD;  Location: [facility];  Service: Orthopedics;  Laterality: Right;  . APPLICATION OF WOUND VAC Right 12/15/2018   Procedure: APPLICATION OF WOUND VAC;  Surgeon: [name], MD;  Location: [facility];  Service: Orthopedics;  Laterality: Right;  . AV FISTULA PLACEMENT Left 03/25/2019   Procedure: LEFT ARM ARTERIOVENOUS (AV) FISTULA CREATION;  Surgeon: [name], MD;  Location: [facility];  Service: Vascular;  Laterality: Left;  . BACK SURGERY    . BASCILIC VEIN TRANSPOSITION Left 05/20/2019   Procedure: SECOND STAGE LEFT BASCILIC VEIN TRANSPOSITION;  Surgeon: [name], MD;  Location: [facility];  Service: Vascular;  Laterality: Left;  . CARDIAC CATHETERIZATION  02/17/2014  . CHOLECYSTECTOMY    . COLONOSCOPY  2011   in [place], [place] GRAFT  2008  . I & D EXTREMITY Right 12/15/2018   Procedure: DEBRIDEMENT RIGHT FOOT;  Surgeon: [name], MD;  Location: [facility];  Service: Orthopedics;  Laterality: Right;  . I & D EXTREMITY Right 08/27/2019   Procedure: PARTIAL CUBOID EXCISION RIGHT FOOT;  Surgeon: [name], MD;  Location: [facility];  Service: Orthopedics;  Laterality: Right;  . I & D EXTREMITY Right 01/07/2020   Procedure: RIGHT FOOT EXCISION INFECTED BONE;  Surgeon: [name], MD;  Location: [facility];  Service: Orthopedics;  Laterality: Right;  . NECK SURGERY     novemver 2019  .  WISDOM TOOTH EXTRACTION     Social History   Occupational History  . Not on file  Tobacco Use  . Smoking status: Former Smoker    Years: 25.00    Types: Cigars    Start date: 04/08/1973    Quit date: 12/24/1988    Years since quitting: 31.1  . Smokeless tobacco: Never Used  . Tobacco comment: Cigars and Pipe   Vaping Use  . Vaping Use: Never used  Substance and Sexual Activity  . Alcohol use: No  . Drug use: No  . Sexual activity: Yes    Partners: Female

## 2020-02-22 ENCOUNTER — Telehealth: Payer: Self-pay | Admitting: Internal Medicine

## 2020-02-22 NOTE — Telephone Encounter (Signed)
Nothing else to offer in addition to Brian's recs which he should be following to the letter at this point and ok to add on any day at 1130 x for Friday 02/25/20 but must bring all active meds with him to office

## 2020-02-22 NOTE — Telephone Encounter (Signed)
Called and spoke with patient, provided instructions per Dr. Melvyn Novas.  Patient stated the instructions from his visit with Wyn Quaker NP were not clear and some of the medications were not even listed on the instructions.  I read from the actual visit with the instructions, patient stated the detailed instructions did not sound familiar to him.  He asked what I was ready from and I advised him the office note, however, I would print out the AVS and compare the two.  The instructions were the same.  Patient stated he was reading them from mychart.  I advised him to always call us or message [place] on mychart with any questions as we want him to understand his instructions.  I advised him to take all of his medications with him to his appointment with Dr. Melvyn Novas on Friday.  He understands this appointment is in [place] as he is not in the [place] office this week, offered an appointment next week, however, he did not want to wait that long.  He has the address and phone number.  He verbalized understanding.  Nothing further needed.

## 2020-02-22 NOTE — Telephone Encounter (Signed)
Spoke with the [name]  He states that his cough is "getting worse by the day"- ever since the last ov with Aaron Edelman on 01/28/20  Cough is non prod and he is not having any other symptoms like wheezing, SOB, f/c/s, aches  He is requesting sooner appt with Dr Melvyn Novas- currently scheduled in [place] for 03/16/20  Last ov AVS:   Return in about 2 months (around 03/29/2020), or if symptoms worsen or fail to improve, for Follow up with Dr. Melvyn Novas. You were seen today by Lauraine Rinne, NP  for:   1. Chronic cough  - omeprazole (PRILOSEC) 40 MG capsule; Take 1 capsule (40 mg total) by mouth daily.  Dispense: 30 capsule; Refill: 3 - famotidine (PEPCID) 20 MG tablet; Take 1 tablet (20 mg total) by mouth at bedtime.  Dispense: 30 tablet; Refill: 3 - DG Chest 2 View; Future  Restart omeprazole 40 mg tablet  >>>Please take 1 tablet daily 15 minutes to 30 minutes before your first meal of the day as well as before your other medications >>>Try to take at the same time each day >>>take this medication daily  Restart Pepcid 20 mg at night  GERD management: >>>Avoid laying flat until 2 hours after meals >>>Elevate head of the bed including entire chest >>>Reduce size of meals and amount of fat, acid, spices, caffeine and sweets >>>If you are smoking, Please stop! >>>Decrease alcohol consumption >>>Work on maintaining a healthy weight with normal BMI   Cough Home Instructions:  We believe you have a chronic/cyclical cough that is aggravated by reflux , coughing , and drainage.   Goal is to not Cough or clear throat.   Avoid coughing or clearing throat by using:  ? non-mint products/sugarless candy ? Water ? ice chips ? Remember NO MINT PRODUCTS   Medications to use:  ? Mucinex DM 1-2 every 12 hrs or Delsym 2 tsp every 12 hrs for cough (These are Over the counter) ? Tessalon Three times a day  As needed  Cough.  ? Prilosec 40mg  30 min before breakfast or dinner.  ? Pepcid 20mg  before dinner   ? Zyrtec 10mg  at bedtime (Can use generic, this is over the counter) ? Chlor tabs 4mg  2 at bedtime  for nasal drip until cough is 100% cough free. (this medication is over the counter)       He is taking the prilosec, pepcid, tessalon but HAS NOT tried chlor tabs mucinex, delsym, or zyrtec. I advised that we can check with Dr Melvyn Novas but not sure if he will want to add anything else since he has not done what was already instructed at the last visit. Please advise, thanks!

## 2020-02-24 ENCOUNTER — Encounter: Payer: Self-pay | Admitting: Orthopedic Surgery

## 2020-02-24 ENCOUNTER — Encounter: Payer: Self-pay | Admitting: Internal Medicine

## 2020-02-24 ENCOUNTER — Ambulatory Visit (INDEPENDENT_AMBULATORY_CARE_PROVIDER_SITE_OTHER): Payer: Medicare Other | Admitting: Internal Medicine

## 2020-02-24 ENCOUNTER — Other Ambulatory Visit: Payer: Self-pay

## 2020-02-24 ENCOUNTER — Ambulatory Visit (HOSPITAL_COMMUNITY)
Admission: RE | Admit: 2020-02-24 | Discharge: 2020-02-24 | Disposition: A | Discharge status: Home / Self Care | Payer: Medicare Other | Source: Ambulatory Visit | Attending: Internal Medicine | Admitting: Internal Medicine

## 2020-02-24 ENCOUNTER — Ambulatory Visit (INDEPENDENT_AMBULATORY_CARE_PROVIDER_SITE_OTHER): Payer: Medicare Other | Admitting: Orthopedic Surgery

## 2020-02-24 DIAGNOSIS — J90 Pleural effusion, not elsewhere classified: Secondary | ICD-10-CM

## 2020-02-24 DIAGNOSIS — R053 Chronic cough: Secondary | ICD-10-CM

## 2020-02-24 DIAGNOSIS — I6523 Occlusion and stenosis of bilateral carotid arteries: Secondary | ICD-10-CM | POA: Diagnosis not present

## 2020-02-24 DIAGNOSIS — Z89421 Acquired absence of other right toe(s): Secondary | ICD-10-CM

## 2020-02-24 MED ORDER — BENZONATATE 200 MG PO CAPS: 200.0000 mg | ORAL_CAPSULE | Freq: Three times a day (TID) | ORAL | 0 refills | Status: DC | PRN

## 2020-02-24 MED ORDER — METHYLPREDNISOLONE ACETATE 80 MG/ML IJ SUSP
120.0000 mg | Freq: Once | INTRAMUSCULAR | Status: AC
  Administered 2020-02-24: 120 mg via INTRAMUSCULAR

## 2020-02-24 NOTE — Progress Notes (Signed)
Office Visit Note   Patient: [name]           Date of Birth: 1950/12/27           MRN: [id number] Visit Date: 02/24/2020              Requested by: Lorenda Hatchet, [place] [address] [place],  [place] [address] PCP: Lorenda Hatchet, FNP  Chief Complaint  Patient presents with  . Right Foot - Pain, Follow-up      HPI: Patient is a 69 year old gentleman status post revision lateral column amputation of the right foot he currently uses a 4 pronged cane he has an extra-depth shoe custom orthotics and a anterior foot orthosis.  He has finished his antibiotics.  Assessment & Plan: Visit Diagnoses:  1. History of partial ray amputation of fourth toe of right foot ([place])     Plan: Recommended that he could try DuoDERM over the small area of skin breakdown.  Increase his activities as tolerated.  Follow-Up Instructions: Return in about 4 weeks (around 03/23/2020).   Ortho Exam  Patient is alert, oriented, no adenopathy, well-dressed, normal affect, normal respiratory effort. Examination there is no redness no cellulitis no swelling the incision is well-healed there is no tenderness to palpation there is 1 area of granulation tissue that is 5 mm in diameter and 0.1 mm deep no signs of infection no exposed bone or tendon.  Imaging: No results found. No images are attached to the encounter.  Labs: Lab Results  Component Value Date   HGBA1C 6.3 (H) 12/14/2018   HGBA1C 6.0 (H) 12/24/2015   HGBA1C 7.0 (H) 12/25/2013   ESRSEDRATE 34 (H) 01/24/2020   ESRSEDRATE 41 (H) 01/10/2020   ESRSEDRATE 55 (H) 12/27/2019   CRP 8.8 (H) 01/24/2020   CRP 35.9 (H) 01/10/2020   CRP 77.9 (H) 12/27/2019   REPTSTATUS 01/15/2020 FINAL 01/07/2020   GRAMSTAIN  01/07/2020    FEW WBC PRESENT, PREDOMINANTLY PMN NO ORGANISMS SEEN    CULT  01/07/2020    RARE STAPHYLOCOCCUS HAEMOLYTICUS RARE CORYNEBACTERIUM STRIATUM Standardized susceptibility testing for this organism is not  available. RARE STENOTROPHOMONAS MALTOPHILIA CRITICAL RESULT CALLED TO, READ BACK BY AND VERIFIED WITH: DR Devota Viruet VIA EPIC [id number] FCP NO ANAEROBES ISOLATED Performed at [facility], [place] [address], [place], [place] [address]    LABORGA STAPHYLOCOCCUS HAEMOLYTICUS 01/07/2020   LABORGA STENOTROPHOMONAS MALTOPHILIA 01/07/2020     Lab Results  Component Value Date   ALBUMIN 3.0 (L) 12/14/2018   ALBUMIN 3.7 12/24/2015   ALBUMIN 3.7 12/28/2014   PREALBUMIN 14.8 (L) 12/14/2018    No results found for: MG No results found for: VD25OH  Lab Results  Component Value Date   PREALBUMIN 14.8 (L) 12/14/2018   CBC EXTENDED Latest Ref Rng & Units 01/07/2020 12/27/2019 08/27/2019  WBC 3.8 - 10.8 Thousand/uL - 6.0 -  RBC 4.20 - 5.80 Million/uL - 3.85(L) -  HGB 13.0 - 17.0 g/dL 10.5(L) 10.8(L) 7.8(L)  HCT 39 - 52 % 31.0(L) 33.9(L) 23.0(L)  PLT 140 - 400 Thousand/uL - 180 -  NEUTROABS 1,500 - 7,800 cells/uL - - -  LYMPHSABS 850 - 3,900 cells/uL - - -     There is no height or weight on file to calculate BMI.  Orders:  No orders of the defined types were placed in this encounter.  No orders of the defined types were placed in this encounter.    Procedures: No procedures performed  Clinical Data: No additional findings.  ROS:  All other systems negative, except as noted in the HPI. Review of Systems  Objective: Vital Signs: There were no vitals taken for this visit.  Specialty Comments:  No specialty comments available.  PMFS History: Patient Active Problem List   Diagnosis Date Noted  . Healthcare maintenance 01/28/2020  . Shortness of breath 01/28/2020  . History of partial ray amputation of fourth toe of right foot ([place]) 09/08/2019  . Ulcerated, foot, right, with necrosis of bone ([place])   . Chronic cough 06/25/2019  . Cutaneous abscess of right foot   . Subacute osteomyelitis of right foot ([place]) 12/14/2018  . AKI (acute kidney injury) ([place]) 12/14/2018  . CKD  (chronic kidney disease) stage 4, GFR 15-29 ml/min (HCC) 12/14/2018  . Gait abnormality 08/24/2018  . Diabetic neuropathy ([place]) 02/06/2018  . Cervical myelopathy ([place]) 02/06/2018  . Onychomycosis 10/30/2017  . Other spondylosis with radiculopathy, cervical region 01/27/2017  . Midfoot ulcer, right, limited to breakdown of skin ([place]) 11/15/2016  . Lateral epicondylitis, left elbow 08/12/2016  . Cellulitis of fifth toe of right foot 07/29/2016  . Prostate cancer ([place]) 06/19/2016  . Non-pressure chronic ulcer of other part of right foot limited to breakdown of skin ([place]) 04/04/2016  . Cellulitis of leg, right 12/24/2015  . Cellulitis of right lower extremity 12/24/2015  . CKD (chronic kidney disease), stage III ([place]) 12/25/2013  . Foot infection 12/24/2013  . Foot ulcer, left ([place]) 12/24/2013  . Diabetic foot ulcer ([place]) 09/14/2012  . Gout 09/14/2012  . HTN (hypertension) 09/14/2012  . Hypothyroidism 09/14/2012  . CAD (coronary artery disease) 09/14/2012  . Diabetes mellitus ([place]) 09/14/2012  . Hypercholesteremia   . Neuropathy [facility])    Past Medical History:  Diagnosis Date  . Ambulates with cane    straight cane  . Cervical myelopathy ([place]) 02/06/2018  . Chronic kidney disease    dailysis M W F- home  . Complication of anesthesia   . Coronary artery disease   . Diabetes mellitus without complication ([place])    type2  . Diabetic foot ulcer ([place])   . Diabetic foot ulcer ([place])   . Diabetic neuropathy ([place]) 02/06/2018  . Gait abnormality 08/24/2018  . GERD (gastroesophageal reflux disease)     01/06/20- not current  . Gout   . History of kidney stones    passed stones  . Hypercholesteremia   . Hypertension   . Hypothyroidism   . Neuromuscular disorder (HCC)    neuropathy left leg and bilateral feet  . Neuropathy   . PONV (postoperative nausea and vomiting)   . Prostate cancer [facility])     Family History  Problem Relation Age of Onset  . Diabetes Mellitus II Mother   .  Diabetes Mellitus II Father   . CAD Father   . Cancer Father        prostate  . Diabetes Mellitus II Brother   . Diabetes Mellitus II Brother   . Stomach cancer Brother 44  . Colon cancer Neg Hx   . Colon polyps Neg Hx   . Esophageal cancer Neg Hx   . Rectal cancer Neg Hx     Past Surgical History:  Procedure Laterality Date  . AMPUTATION Left 12/25/2013   Procedure: AMPUTATION RAY LEFT 5TH RAY;  Surgeon: [name], MD;  Location: [facility];  Service: Orthopedics;  Laterality: Left;  . AMPUTATION Right 12/15/2018   Procedure: AMPUTATION OF 4TH AND 5TH TOES RIGHT FOOT;  Surgeon: [name], MD;  Location: [place];  Service: Orthopedics;  Laterality: Right;  . APPLICATION OF WOUND VAC Right 12/15/2018   Procedure: APPLICATION OF WOUND VAC;  Surgeon: [name], MD;  Location: [place];  Service: Orthopedics;  Laterality: Right;  . AV FISTULA PLACEMENT Left 03/25/2019   Procedure: LEFT ARM ARTERIOVENOUS (AV) FISTULA CREATION;  Surgeon: [name], MD;  Location: [place];  Service: Vascular;  Laterality: Left;  . BACK SURGERY    . BASCILIC VEIN TRANSPOSITION Left 05/20/2019   Procedure: SECOND STAGE LEFT BASCILIC VEIN TRANSPOSITION;  Surgeon: [name], MD;  Location: [place];  Service: Vascular;  Laterality: Left;  . CARDIAC CATHETERIZATION  02/17/2014  . CHOLECYSTECTOMY    . COLONOSCOPY  2011   in [place], [place] GRAFT  2008  . I & D EXTREMITY Right 12/15/2018   Procedure: DEBRIDEMENT RIGHT FOOT;  Surgeon: [name], MD;  Location: [place];  Service: Orthopedics;  Laterality: Right;  . I & D EXTREMITY Right 08/27/2019   Procedure: PARTIAL CUBOID EXCISION RIGHT FOOT;  Surgeon: [name], MD;  Location: [place];  Service: Orthopedics;  Laterality: Right;  . I & D EXTREMITY Right 01/07/2020   Procedure: RIGHT FOOT EXCISION INFECTED BONE;  Surgeon: [name], MD;  Location: [place];  Service: Orthopedics;  Laterality: Right;  . NECK SURGERY      novemver 2019  . WISDOM TOOTH EXTRACTION     Social History   Occupational History  . Not on file  Tobacco Use  . Smoking status: Former Smoker    Years: 25.00    Types: Cigars    Start date: 04/08/1973    Quit date: 12/24/1988    Years since quitting: 31.1  . Smokeless tobacco: Never Used  . Tobacco comment: Cigars and Pipe   Vaping Use  . Vaping Use: Never used  Substance and Sexual Activity  . Alcohol use: No  . Drug use: No  . Sexual activity: Yes    Partners: Female

## 2020-02-24 NOTE — Progress Notes (Signed)
[name], male    DOB: 02/16/1951,     MRN: [id number]   Brief patient profile:  70 yowm quit smoking 1990 no sequelae with  hbp /dm/ IHD s/p cabg 2005 [place], [place] on HD since early  of Jan 2021 non-stop since so referred to pulmonary clinic 06/25/2019 by Dr   [name]    History of Present Illness  06/25/2019  Pulmonary/ 1st office eval/[name]  Chief Complaint  Patient presents with  . Pulmonary Consult    Referred by Dr [name]. Pt c/o cough x 3 months- non prod.   Dyspnea:  Not limited by breathing from desired activities  Including wt lifting  Cough: indolent onset 3 m prior to OV   of persitent daily dry tickle in throat not really much production at all, no assoc nasal symptoms or prior h/o seasonal rhinitis or even much problem with colds in past. Sleep: at hs worse ? Better p inhaler, tessalon immediately on waking/ bed is flat with two big pillws and dog in bedroom  SABA use: 4 total per per day  Lots of halls not helping  Kouffman Reflux v Neurogenic Cough Differentiator Reflux Comments  Do you awaken from a sound sleep coughing violently?                            With trouble breathing? No sometimes   Do you have choking episodes when you cannot  Get enough air, gasping for air ?              When it's severe   Do you usually cough when you lie down into  The bed, or when you just lie down to rest ?                          Yes   Do you usually cough after meals or eating?         No   Do you cough when (or after) you bend over?    no   GERD SCORE     Kouffman Reflux v Neurogenic Cough Differentiator Neurogenic   Do you more-or-less cough all day long? sporadic   Does change of temperature make you cough? Cold    Does laughing or chuckling cause you to cough? no   Do fumes (perfume, automobile fumes, burned  Toast, etc.,) cause you to cough ?      no   Does speaking, singing, or talking on the phone cause you to cough   ?               [name]     Neurogenic/Airway score    REC Pantoprazole (protonix) 40 mg   Take  30-60 min before first meal of the day and Pepcid (famotidine)  20 mg one after supper  until return to office  For cough > tessalon 200 mg every 6-8 hours to eliminate all the coughing  gerd diet  Depomedrol 120 mg IM today        07/23/2019  f/u ov/[name] re: cough since first of 2021  Chief Complaint  Patient presents with  . Follow-up    Cough has resolved. He is taking the tessalon about 2 x per day. He has not had to use his albuterol.   Dyspnea:  Not limited by breathing from desired activities  No real aerobics Cough: gone but still using tessalon bid Sleeping:  slt elevation  pillows  SABA use: none  02: none rec Tessalon and f/u in 6 weeks > did not return       NP  Ov 01/28/20 2-3 weeks  Dry cough  Worse at night Using tessalon pearles every night  Worsened shortness of breath at night   Stopped reflux meds in June  2021   1. Chronic cough  - omeprazole (PRILOSEC) 40 MG capsule; Take 1 capsule (40 mg total) by mouth daily.  Dispense: 30 capsule; Refill: 3 - famotidine (PEPCID) 20 MG tablet; Take 1 tablet (20 mg total) by mouth at bedtime.  Dispense: 30 tablet; Refill: 3 - DG Chest 2 View; Future  Restart omeprazole 40 mg tablet  >>>Please take 1 tablet daily 15 minutes to 30 minutes before your first meal of the day as well as before your other medications >>>Try to take at the same time each day >>>take this medication daily  Restart Pepcid 20 mg at night  GERD management: >>>Avoid laying flat until 2 hours after meals >>>Elevate head of the bed including entire chest >>>Reduce size of meals and amount of fat, acid, spices, caffeine and sweets >>>If you are smoking, Please stop! >>>Decrease alcohol consumption >>>Work on maintaining a healthy weight with normal BMI   Cough Home Instructions:  We believe you have a chronic/cyclical cough that is aggravated by reflux , coughing ,  and drainage.  . Goal is to not Cough or clear throat.  Marland Kitchen Avoid coughing or clearing throat by using:  o non-mint products/sugarless candy o Water o ice chips o Remember NO MINT PRODUCTS  . Medications to use:  o Mucinex DM 1-2 every 12 hrs or Delsym 2 tsp every 12 hrs for cough (These are Over the counter) o Tessalon Three times a day  As needed  Cough.  o Prilosec 40mg  30 min before breakfast or dinner.  o Pepcid 20mg  before dinner  o Zyrtec 10mg  at bedtime (Can use generic, this is over the counter) o Chlor tabs 4mg  2 at bedtime  for nasal drip until cough is 100% cough free. (this medication is over the counter)    02/24/2020  Acute  ov/[name] office/[name] re: severe  cough since early oct 2021  Chief Complaint  Patient presents with  . Follow-up    non productive cough gets SOB when coughing  previous cough 100% gone until returned to in center HD now back at home  Just started 02/11/20  Dyspnea: worse lying down / no problem walking with cane  Cough: after supper seems worse/ hacking dry cough Sleeping: flat bed/ 2 pillows under  head  SABA use: no better p saba  02: none  Not using 1st gen H1 blockers per guidelines and using mints (mentos)  No obvious day to day or daytime variability or assoc excess/ purulent sputum or mucus plugs or hemoptysis or cp or chest tightness, subjective wheeze or overt sinus or hb symptoms.    . Also denies any obvious fluctuation of symptoms with weather or environmental changes or other aggravating or alleviating factors except as outlined above   No unusual exposure hx or h/o childhood pna/ asthma or knowledge of premature birth.  Current Allergies, Complete Past Medical History, Past Surgical History, Family History, and Social History were reviewed in [employer] record.  ROS  The following are not active complaints unless bolded Hoarseness, sore throat, dysphagia, dental problems, itching, sneezing,  nasal  congestion or discharge of excess mucus or  purulent secretions, ear ache,   fever, chills, sweats, unintended wt loss or wt gain, classically pleuritic or exertional cp,  orthopnea pnd or arm/hand swelling  or leg swelling, presyncope, palpitations, abdominal pain, anorexia, nausea, vomiting, diarrhea  or change in bowel habits or change in bladder habits, change in stools or change in urine, dysuria, hematuria,  rash, arthralgias, visual complaints, headache, numbness, weakness or ataxia or problems with walking or coordination,  change in mood or  memory.        Current Meds  Medication Sig  . allopurinol (ZYLOPRIM) 100 MG tablet Take 200 mg by mouth daily.   Marland Kitchen aspirin EC 81 MG tablet Take 81 mg by mouth every morning.   . B Complex-C-Zn-Folic Acid (DIALYVITE 220 WITH ZINC) 0.8 MG TABS Take 1 tablet by mouth daily.  . benzonatate (TESSALON) 100 MG capsule TAKE 1 CAPSULE BY MOUTH THREE TIMES A DAY  . benzonatate (TESSALON) 200 MG capsule Take 1 capsule (200 mg total) by mouth 3 (three) times daily as needed for cough.  . calcitRIOL (ROCALTROL) 0.5 MCG capsule Take 0.5 mcg by mouth 3 (three) times a week.  . carvedilol (COREG) 6.25 MG tablet Take 6.25 mg by mouth 2 (two) times daily.  . chlorhexidine (PERIDEX) 0.12 % solution 15 mLs by Mouth Rinse route daily.  . diclofenac Sodium (VOLTAREN) 1 % GEL Apply 1 application topically daily as needed (pain).   . famotidine (PEPCID) 20 MG tablet Take 1 tablet (20 mg total) by mouth at bedtime.  Marland Kitchen HYDROcodone-acetaminophen (NORCO/VICODIN) 5-325 MG tablet Take 1 tablet by mouth every 6 (six) hours as needed.  . Ibuprofen-Acetaminophen (ADVIL DUAL ACTION) 125-250 MG TABS Take 4 tablets by mouth daily as needed (Pain).  . insulin degludec (TRESIBA FLEXTOUCH) 200 UNIT/ML FlexTouch Pen Inject 10 Units into the skin at bedtime.   . Lancets (ONETOUCH DELICA PLUS URKYHC62B) MISC   . levothyroxine (SYNTHROID, LEVOTHROID) 112 MCG tablet Take 112 mcg by mouth daily  before breakfast. BRAND NAME SYNTHROID  . Methoxy PEG-Epoetin Beta (MIRCERA IJ) Inject into the skin.  Marland Kitchen omeprazole (PRILOSEC) 40 MG capsule Take 1 capsule (40 mg total) by mouth daily.  Marland Kitchen oxyCODONE-acetaminophen (PERCOCET/ROXICET) 5-325 MG tablet Take 1 tablet by mouth every 4 (four) hours as needed.  . pregabalin (LYRICA) 150 MG capsule Take 150 mg by mouth in the morning and at bedtime.   . rosuvastatin (CRESTOR) 10 MG tablet Take 10 mg by mouth daily.   . Semaglutide, 1 MG/DOSE, (OZEMPIC, 1 MG/DOSE,) 2 MG/1.5ML SOPN Inject 0.5 mg into the skin every Friday. Takes in the evening  . sevelamer carbonate (RENVELA) 800 MG tablet Take 2,400 mg by mouth 3 (three) times daily.  Marland Kitchen zinc gluconate 50 MG tablet Take 50 mg by mouth daily.            Past Medical History:  Diagnosis Date  . Ambulates with cane    straight cane  . Cervical myelopathy ([place]) 02/06/2018  . Chronic kidney disease    dailysis M W F  . Coronary artery disease   . Diabetes mellitus without complication ([place])    type2  . Diabetic foot ulcer ([place])   . Diabetic foot ulcer ([place])   . Diabetic neuropathy ([place]) 02/06/2018  . Gait abnormality 08/24/2018  . GERD (gastroesophageal reflux disease)   . History of kidney stones    passed stones  . Hypercholesteremia   . Hypertension   . Hypothyroidism   . Neuromuscular disorder ([place])  neuropathy left leg and bilateral feet  . Neuropathy   . Prostate cancer ([place])        Objective:        02/24/2020     204   07/23/19 203 lb 12.8 oz (92.4 kg)  07/20/19 209 lb 3.2 oz (94.9 kg)  06/28/19 200 lb (90.7 kg)     Vital signs reviewed  02/24/2020  - Note at rest 02 sats  98% on RA     4 pronged walker 2 foot braces    HEENT : pt wearing mask not removed for exam due to covid -19 concerns.    NECK :  without JVD/Nodes/TM/ nl carotid upstrokes bilaterally   LUNGS: no acc muscle use,  Nl contour chest witht cough early on insp  Maneuvers and decreased bs/dullness in  bases L > R   CV:  RRR  no s3 or murmur or increase in P2, and no edema   ABD:  soft and nontender with nl inspiratory excursion in the supine position. No bruits or organomegaly appreciated, bowel sounds nl  MS:  Nl gait/ ext warm without deformities, calf tenderness, cyanosis or clubbing No obvious joint restrictions   SKIN: warm and dry without lesions    NEURO:  alert, approp, nl sensorium with  no motor or cerebellar deficits apparent.     CXR PA and Lateral:   02/24/2020 :    I personally reviewed images and  impression as follows:   Bilateral L > R effusions minimal change from priors but def new since 06/25/2019            Assessment

## 2020-02-24 NOTE — Assessment & Plan Note (Signed)
Onset around 1st of 2021  - Allergy profile 06/25/2019 >  Eos 0.2 /  IgE 10  RAST neg  - max gerd rx 06/25/2019 > much better 07/23/2019 rec taper tessalon down/ off - recurrent cough while off gerd rx early oct 2021  - cyclical cough regimen 60/63/0160   Of the three most common causes of  Sub-acute / recurrent or chronic cough, only one (GERD)  can actually contribute to/ trigger  the other two (asthma and post nasal drip syndrome)  and perpetuate the cylce of cough.  While not intuitively obvious, many patients with chronic low grade reflux do not cough until there is a primary insult that disturbs the protective epithelial barrier and exposes sensitive nerve endings.   This is typically viral but can due to PNDS and  either may apply here.   The point is that once this occurs, it is difficult to eliminate the cycle  using anything but a maximally effective acid suppression regimen at least in the short run, accompanied by an appropriate diet to address non acid GERD and control / eliminate the cough itself for at least 3 days with tessalon and percocet prn plus 1st gen H1 blockers per guidelines  And Also added Depomedrol 141m mg IM   in case of component of Th-2 driven upper or lower airways inflammation (if cough responds short term only to relapse befor return while will on rx for uacs that would point to allergic rhinitis/ asthma or eos bronchitis)

## 2020-02-24 NOTE — Assessment & Plan Note (Signed)
New finding  01/27/20 in HD pt   Most likely related to chronic vol overload in this HD pt but could also be occult chf > check echo next step.          Each maintenance medication was reviewed in detail including emphasizing most importantly the difference between maintenance and prns and under what circumstances the prns are to be triggered using an action plan format where appropriate.  Total time for H and P, chart review, counseling,  and generating customized AVS unique to this office visit / charting  > 40 min

## 2020-02-24 NOTE — Patient Instructions (Addendum)
Pepcid 20 mg after supper    Omeprazole 40 mg Take 30-60 min before first meal of the day   GERD (REFLUX)  is an extremely common cause of respiratory symptoms just like yours , many times with no obvious heartburn at all. +g   It can be treated with medication, but also with lifestyle changes including elevation of the head of your bed (ideally with 6 -8inch blocks under the headboard of your bed),  Smoking cessation, avoidance of late meals, excessive alcohol, and avoid fatty foods, chocolate, peppermint, colas, red wine, and acidic juices such as orange juice.  NO MINT OR MENTHOL PRODUCTS SO NO COUGH DROPS  USE SUGARLESS CANDY INSTEAD (Jolley ranchers or Stover's or Life Savers) or even ice chips will also do - the key is to swallow to prevent all throat clearing. NO OIL BASED VITAMINS - use powdered substitutes.  Avoid fish oil when coughing.  Depomedrol 120 mg IM   Tessalon 200 mg about  Every 8 hours around the clock and supplement if needed  with either vicodin or percocet every 4 hours  As needed x 3 straight days  For drainage / throat tickle try take CHLORPHENIRAMINE  4 mg  (Chlortab 4mg   at [employer] should be easiest to find in the green box)  take one every 4 hours as needed - available over the counter- may cause drowsiness so start with just a bedtime dose or two and see how you tolerate it before trying in daytime    Please remember to go to the  x-ray department  @  [facility] for your tests - we will call you with the results when they are available       Keep the [facility] appointment  Add: needs echo to eval effusions

## 2020-02-25 ENCOUNTER — Other Ambulatory Visit: Payer: Self-pay

## 2020-02-25 ENCOUNTER — Ambulatory Visit: Payer: Medicare Other | Admitting: Internal Medicine

## 2020-02-25 DIAGNOSIS — J90 Pleural effusion, not elsewhere classified: Secondary | ICD-10-CM

## 2020-02-25 DIAGNOSIS — R0602 Shortness of breath: Secondary | ICD-10-CM

## 2020-02-25 NOTE — Progress Notes (Signed)
Called and went over xray results per Dr Melvyn Novas with patient. All questions answered and patient expressed full understanding and ok with order being placed for Echo. Order placed per Dr Melvyn Novas. Nothing further needed at this time.

## 2020-02-28 ENCOUNTER — Ambulatory Visit (INDEPENDENT_AMBULATORY_CARE_PROVIDER_SITE_OTHER): Payer: Medicare Other | Admitting: Infectious Diseases

## 2020-02-28 ENCOUNTER — Other Ambulatory Visit: Payer: Self-pay

## 2020-02-28 ENCOUNTER — Encounter: Payer: Self-pay | Admitting: Infectious Diseases

## 2020-02-28 VITALS — BP 175/78 | HR 69 | Wt 199.0 lb

## 2020-02-28 DIAGNOSIS — M869 Osteomyelitis, unspecified: Secondary | ICD-10-CM | POA: Diagnosis present

## 2020-02-28 NOTE — Progress Notes (Addendum)
[facility]                                                             [place], [place], [place], [address]                                                                  Phn. [phone]; Fax: [phone]                                                                             Date: 02/28/20  Reason for Follow Up: Diabetic Foot Osteomyelitis   Assessment/Plan 1. Rt Foot Diabetic Ulcer/Rt Cuboid and 4th metatarsal base Osteomyelitis S/p Rt foot excisional debridement on 10/1. OR cx is growing Staphylococcus haemolyticus,  Corynebacterium striatum and Stenotrophomonas maltophilia   - Patient has already completed 6 weeks of IV vancomycin with end date 11/12 and 6 weeks of PO bactrim with end date 11/17. He is off antibiotics currently and follows up with Dr [name]. Last follow up with Dr [name] with good healing of the RT foot wound.   2. Insulin Diabetes mellitus with Diabetic Neuropathy (a1c 6.3) 3. ESRD on HD  4 Medication monitoring Labs 02/16/20 WBC 3.52, hb 9.  Plan Will do CBC and BMP today as patient was on abx until last week as part of medication monitoring Follow up with Dr Pearla Dubonnet Follow up with me PRN  Discussed about diabetic control, regular self exam of foot as measures to prevent further infection of the foot.  All questions and concerns were discussed. Patient verbalised understanding of the plan.   Rosiland Oz, MD [facility]  Office phone [phone] Fax no. [phone] ______________________________________________________________________________________________________________________ Subjective: 69 Y O Caucasian Male with a PMH of IDDM, Diabetic Neuropathy, CAD, HTN, Hypothyroidism, ESRD on HD via Left arm fistula ( 4 times in a week, HLD, s/p 4th and 5th ray amputation in 12/2018) who is here for follow up of Rt lateral  Foot Osteomyelitis. Patient underwent excisional debridement of Rt lateral foot on 10/1. He has been following Dr [name].. Denies any issues with AV fistula.   Patient is accompanied by his wife. Vancomycin was d'ced on 11/12 and PO bactrim was d'ced on 11/17 after completing full 6 weeks course. Complains of minimal bloody drainage on and off from the small opening at the RT lateral wound. Denies any foul smelling drainage/discharge. Denies any fevers, chills, sweats. Overall doing well and no complaints. Wound seems to be healing well.   He is also following with Pulmonary for evaluation of cough.   ROS - 10 point ROS negative except as above  Past Medical History:  Diagnosis Date  . Ambulates with cane    straight cane  . Cervical myelopathy ([place])  02/06/2018  . Chronic kidney disease    dailysis M W F- home  . Complication of anesthesia   . Coronary artery disease   . Diabetes mellitus without complication ([place])    type2  . Diabetic foot ulcer ([place])   . Diabetic foot ulcer ([place])   . Diabetic neuropathy ([place]) 02/06/2018  . Gait abnormality 08/24/2018  . GERD (gastroesophageal reflux disease)     01/06/20- not current  . Gout   . History of kidney stones    passed stones  . Hypercholesteremia   . Hypertension   . Hypothyroidism   . Neuromuscular disorder (HCC)    neuropathy left leg and bilateral feet  . Neuropathy   . PONV (postoperative nausea and vomiting)   . Prostate cancer [facility])    Past Surgical History:  Procedure Laterality Date  . AMPUTATION Left 12/25/2013   Procedure: AMPUTATION RAY LEFT 5TH RAY;  Surgeon: [name], MD;  Location: [facility];  Service: Orthopedics;  Laterality: Left;  . AMPUTATION Right 12/15/2018   Procedure: AMPUTATION OF 4TH AND 5TH TOES RIGHT FOOT;  Surgeon: [name], MD;  Location: [place];  Service: Orthopedics;  Laterality: Right;  . APPLICATION OF WOUND VAC Right 12/15/2018   Procedure: APPLICATION OF WOUND VAC;  Surgeon: [name], MD;   Location: [place];  Service: Orthopedics;  Laterality: Right;  . AV FISTULA PLACEMENT Left 03/25/2019   Procedure: LEFT ARM ARTERIOVENOUS (AV) FISTULA CREATION;  Surgeon: [name], MD;  Location: [place];  Service: Vascular;  Laterality: Left;  . BACK SURGERY    . BASCILIC VEIN TRANSPOSITION Left 05/20/2019   Procedure: SECOND STAGE LEFT BASCILIC VEIN TRANSPOSITION;  Surgeon: [name], MD;  Location: [place];  Service: Vascular;  Laterality: Left;  . CARDIAC CATHETERIZATION  02/17/2014  . CHOLECYSTECTOMY    . COLONOSCOPY  2011   in [place], [place] GRAFT  2008  . I & D EXTREMITY Right 12/15/2018   Procedure: DEBRIDEMENT RIGHT FOOT;  Surgeon: [name], MD;  Location: [place];  Service: Orthopedics;  Laterality: Right;  . I & D EXTREMITY Right 08/27/2019   Procedure: PARTIAL CUBOID EXCISION RIGHT FOOT;  Surgeon: [name], MD;  Location: [place];  Service: Orthopedics;  Laterality: Right;  . I & D EXTREMITY Right 01/07/2020   Procedure: RIGHT FOOT EXCISION INFECTED BONE;  Surgeon: [name], MD;  Location: [place];  Service: Orthopedics;  Laterality: Right;  . NECK SURGERY     novemver 2019  . WISDOM TOOTH EXTRACTION     Current Outpatient Medications on File Prior to Visit  Medication Sig Dispense Refill  . allopurinol (ZYLOPRIM) 100 MG tablet Take 200 mg by mouth daily.     Marland Kitchen aspirin EC 81 MG tablet Take 81 mg by mouth every morning.     . B Complex-C-Zn-Folic Acid (DIALYVITE 161 WITH ZINC) 0.8 MG TABS Take 1 tablet by mouth daily.    . benzonatate (TESSALON) 200 MG capsule Take 1 capsule (200 mg total) by mouth 3 (three) times daily as needed for cough. 90 capsule 0  . carvedilol (COREG) 6.25 MG tablet Take 6.25 mg by mouth 2 (two) times daily.    . diclofenac Sodium (VOLTAREN) 1 % GEL Apply 1 application topically daily as needed (pain).     . famotidine (PEPCID) 20 MG tablet Take 1 tablet (20 mg total) by mouth at bedtime. 30 tablet 3  .  HYDROcodone-acetaminophen (NORCO/VICODIN) 5-325 MG tablet Take 1 tablet by mouth every 6 (six) hours as needed.    Marland Kitchen levothyroxine (SYNTHROID, LEVOTHROID) 112 MCG tablet Take 112 mcg by mouth daily before breakfast. BRAND NAME SYNTHROID    . pregabalin (LYRICA) 150 MG capsule Take 150 mg by mouth in the morning and at bedtime.     . rosuvastatin (CRESTOR) 10 MG tablet Take 10 mg by mouth daily.     . sevelamer carbonate (RENVELA) 800 MG tablet Take 2,400 mg by mouth 3 (three) times daily.    . calcitRIOL (ROCALTROL) 0.5 MCG capsule Take 0.5 mcg by mouth 3 (three) times a week.    . chlorhexidine (PERIDEX) 0.12 % solution 15 mLs by Mouth Rinse route daily.    . Ibuprofen-Acetaminophen (ADVIL DUAL ACTION) 125-250 MG TABS Take 4 tablets by mouth daily as needed (Pain).    . insulin degludec (TRESIBA FLEXTOUCH) 200 UNIT/ML FlexTouch Pen Inject 10 Units into the skin at bedtime.  (Patient not taking: Reported on 02/28/2020)    . Lancets (ONETOUCH DELICA PLUS [id number]) [place]  (Patient not taking: Reported on 02/28/2020)    . Methoxy PEG-Epoetin Beta (MIRCERA IJ) Inject into the skin.    Marland Kitchen omeprazole (PRILOSEC) 40 MG capsule Take 1 capsule (40 mg total) by mouth daily. 30 capsule 3  . oxyCODONE-acetaminophen (PERCOCET/ROXICET) 5-325 MG tablet Take 1 tablet by mouth every 4 (four) hours as needed. 30 tablet 0  . Semaglutide, 1 MG/DOSE, (OZEMPIC, 1 MG/DOSE,) 2 MG/1.5ML SOPN Inject 0.5 mg into the skin every Friday. Takes in the evening (Patient not taking: Reported on 02/28/2020)    . zinc gluconate 50 MG tablet Take 50 mg by mouth daily.     No current facility-administered medications on file prior to visit.   Allergies  Allergen Reactions  . Mushroom Extract Complex Nausea Only   Social History   Socioeconomic History  . Marital status: Married    Spouse name: Not on file  . Number of children: 2  . Years of education: Not on file  . Highest education level: Not on file  Occupational History    . Not on file  Tobacco Use  . Smoking status: Former Smoker    Years: 25.00    Types: Cigars    Start date: 04/08/1973    Quit date: 12/24/1988    Years since quitting: 31.2  . Smokeless tobacco: Never Used  . Tobacco comment: Cigars and Pipe   Vaping Use  . Vaping Use: Never used  Substance and Sexual Activity  . Alcohol use: No  . Drug use: No  . Sexual activity: Yes    Partners: Female  Other Topics Concern  . Not on file  Social History Narrative   Regular exercise: yes 3 times a week   Caffeine use: hot tea   Social Determinants of Health   Financial Resource Strain:   . Difficulty of Paying Living Expenses: Not on file  Food Insecurity:   . Worried About Charity fundraiser in the Last Year: Not on file  . Ran Out of Food in the Last Year: Not on file  Transportation Needs:   . Lack of Transportation (Medical): Not on file  . Lack of Transportation (Non-Medical): Not on file  Physical Activity:   . Days of Exercise per Week: Not on file  . Minutes of Exercise per Session: Not on file  Stress:   . Feeling of Stress : Not on file  Social Connections:   .  Frequency of Communication with Friends and Family: Not on file  . Frequency of Social Gatherings with Friends and Family: Not on file  . Attends Religious Services: Not on file  . Active Member of Clubs or Organizations: Not on file  . Attends Archivist Meetings: Not on file  . Marital Status: Not on file  Intimate Partner Violence:   . Fear of Current or Ex-Partner: Not on file  . Emotionally Abused: Not on file  . Physically Abused: Not on file  . Sexually Abused: Not on file   Vitals BP (!) 175/78   Pulse 69   Wt 199 lb (90.3 kg)   BMI 28.55 kg/m    Examination  General - not in acute distress, comfortably sitting in chair HEENT - PEERLA, no pallor and no icterus Chest - b/l clear air entry, BASAL RALES + CVS- Normal s1s2, RRR Abdomen - Soft, Non tender , non distended RT  foot   Neuro: grossly normal Back - WNL Psych : calm and cooperative  Recent labs CBC Latest Ref Rng & Units 02/28/2020 01/07/2020 12/27/2019  WBC 3.8 - 10.8 Thousand/uL 4.5 - 6.0  Hemoglobin 13.2 - 17.1 g/dL 10.1(L) 10.5(L) 10.8(L)  Hematocrit 38 - 50 % 31.3(L) 31.0(L) 33.9(L)  Platelets 140 - 400 Thousand/uL 89(L) - 180   CMP Latest Ref Rng & Units 01/07/2020 08/27/2019 05/20/2019  Glucose 70 - 99 mg/dL 93 144(H) 129(H)  BUN 8 - 23 mg/dL 33(H) 44(H) 32(H)  Creatinine 0.61 - 1.24 mg/dL 5.70(H) 8.80(H) 5.60(H)  Sodium 135 - 145 mmol/L 141 136 137  Potassium 3.5 - 5.1 mmol/L 4.2 3.7 3.4(L)  Chloride 98 - 111 mmol/L 100 96(L) 95(L)  CO2 22 - 32 mmol/L - - -  Calcium 8.9 - 10.3 mg/dL - - -  Total Protein 6.5 - 8.1 g/dL - - -  Total Bilirubin 0.3 - 1.2 mg/dL - - -  Alkaline Phos 38 - 126 U/L - - -  AST 15 - 41 U/L - - -  ALT 0 - 44 U/L - - -    Pertinent Microbiology Results for orders placed or performed during the hospital encounter of 01/07/20  SARS Coronavirus 2 by RT PCR (hospital order, performed in [facility] hospital lab) Nasopharyngeal Nasopharyngeal Swab     Status: None   Collection Time: 01/07/20 10:08 AM   Specimen: Nasopharyngeal Swab  Result Value Ref Range Status   SARS Coronavirus 2 NEGATIVE NEGATIVE Final    Comment: (NOTE) SARS-CoV-2 target nucleic acids are NOT DETECTED.  The SARS-CoV-2 RNA is generally detectable in upper and lower respiratory specimens during the acute phase of infection. The lowest concentration of SARS-CoV-2 viral copies this assay can detect is 250 copies / mL. A negative result does not preclude SARS-CoV-2 infection and should not be used as the sole basis for treatment or other patient management decisions.  A negative result may occur with improper specimen collection / handling, submission of specimen other than nasopharyngeal swab, presence of viral mutation(s) within the areas targeted by this assay, and inadequate number of  viral copies (<250 copies / mL). A negative result must be combined with clinical observations, patient history, and epidemiological information.  Fact Sheet for Patients:   StrictlyIdeas.no  Fact Sheet for Healthcare Providers: BankingDealers.co.za  This test is not yet approved or  cleared by the [place] FDA and has been authorized for detection and/or diagnosis of SARS-CoV-2 by FDA under an Emergency Use Authorization (EUA).  This EUA  will remain in effect (meaning this test can be used) for the duration of the COVID-19 declaration under Section 564(b)(1) of the Act, 21 U.S.C. section 360bbb-3(b)(1), unless the authorization is terminated or revoked sooner.  Performed at [facility], [place] [address] [place], [place] [address]   Surgical pcr screen     Status: None   Collection Time: 01/07/20 10:55 AM   Specimen: Nasal Mucosa; Nasal Swab  Result Value Ref Range Status   MRSA, PCR NEGATIVE NEGATIVE Final   Staphylococcus aureus NEGATIVE NEGATIVE Final    Comment: (NOTE) The Xpert SA Assay (FDA approved for NASAL specimens in patients 28 years of age and older), is one component of a comprehensive surveillance program. It is not intended to diagnose infection nor to guide or monitor treatment. Performed at [facility], [place] [address] [place], [place] [address]   Aerobic/Anaerobic Culture (surgical/deep wound)     Status: None   Collection Time: 01/07/20  1:15 PM   Specimen: Bone; Tissue  Result Value Ref Range Status   Specimen Description TISSUE BONE  Final   Special Requests BONE TISSUE RIGHT FOOT SPEC A  Final   Gram Stain   Final    FEW WBC PRESENT, PREDOMINANTLY PMN NO ORGANISMS SEEN    Culture   Final    RARE STAPHYLOCOCCUS HAEMOLYTICUS RARE CORYNEBACTERIUM STRIATUM Standardized susceptibility testing for this organism is not available. RARE STENOTROPHOMONAS MALTOPHILIA CRITICAL RESULT CALLED  TO, READ BACK BY AND VERIFIED WITH: DR DUDA VIA EPIC [id number] FCP NO ANAEROBES ISOLATED Performed at [facility], [place] [address], [place], [place] [address]    Report Status 01/15/2020 FINAL  Final   Organism ID, Bacteria STAPHYLOCOCCUS HAEMOLYTICUS  Final   Organism ID, Bacteria STENOTROPHOMONAS MALTOPHILIA  Final      Susceptibility   Stenotrophomonas maltophilia - MIC*    LEVOFLOXACIN 0.5 SENSITIVE Sensitive     TRIMETH/SULFA <=20 SENSITIVE Sensitive     * RARE STENOTROPHOMONAS MALTOPHILIA   Staphylococcus haemolyticus - MIC*    CIPROFLOXACIN >=8 RESISTANT Resistant     ERYTHROMYCIN >=8 RESISTANT Resistant     GENTAMICIN <=0.5 SENSITIVE Sensitive     OXACILLIN >=4 RESISTANT Resistant     TETRACYCLINE >=16 RESISTANT Resistant     VANCOMYCIN <=0.5 SENSITIVE Sensitive     TRIMETH/SULFA >=320 RESISTANT Resistant     CLINDAMYCIN 1 INTERMEDIATE Intermediate     RIFAMPIN <=0.5 SENSITIVE Sensitive     Inducible Clindamycin NEGATIVE Sensitive     * RARE STAPHYLOCOCCUS HAEMOLYTICUS     All pertinent labs/Imagings/notes reviewed. All pertinent plain films and CT images have been personally visualized and interpreted; radiology reports have been reviewed. Decision making incorporated into the Impression / Recommendations.  Chest Xray 02/24/20 FINDINGS: Sequelae of CABG are again identified. The cardiomediastinal silhouette is unchanged with normal heart size. There are persistent small pleural effusions, left larger than right with the left-sided effusion having slightly increased in size. No airspace consolidation, edema, or pneumothorax is identified. No acute osseous abnormality is seen.  IMPRESSION: Persistent small bilateral pleural effusions, slightly increased on the left.  I spent greater than 20  minutes with the patient including greater than 50% of time in face to face counsel of the patient and in coordination of their care.

## 2020-02-29 LAB — CBC
HCT: 31.3 % — ABNORMAL LOW (ref 38.5–50.0)
Hemoglobin: 10.1 g/dL — ABNORMAL LOW (ref 13.2–17.1)
MCH: 29.1 pg (ref 27.0–33.0)
MCHC: 32.3 g/dL (ref 32.0–36.0)
MCV: 90.2 fL (ref 80.0–100.0)
MPV: 12.4 fL (ref 7.5–12.5)
Platelets: 89 10*3/uL — ABNORMAL LOW (ref 140–400)
RBC: 3.47 10*6/uL — ABNORMAL LOW (ref 4.20–5.80)
RDW: 15.7 % — ABNORMAL HIGH (ref 11.0–15.0)
WBC: 4.5 10*3/uL (ref 3.8–10.8)

## 2020-02-29 LAB — BASIC METABOLIC PANEL
BUN/Creatinine Ratio: 6 (calc) (ref 6–22)
BUN: 36 mg/dL — ABNORMAL HIGH (ref 7–25)
CO2: 30 mmol/L (ref 20–32)
Calcium: 9.1 mg/dL (ref 8.6–10.3)
Chloride: 100 mmol/L (ref 98–110)
Creat: 5.97 mg/dL — ABNORMAL HIGH (ref 0.70–1.18)
Glucose, Bld: 121 mg/dL — ABNORMAL HIGH (ref 65–99)
Potassium: 4.1 mmol/L (ref 3.5–5.3)
Sodium: 141 mmol/L (ref 135–146)

## 2020-03-08 ENCOUNTER — Other Ambulatory Visit: Payer: Self-pay

## 2020-03-08 ENCOUNTER — Ambulatory Visit: Payer: Medicare Other | Admitting: Cardiology

## 2020-03-08 ENCOUNTER — Encounter: Payer: Self-pay | Admitting: Cardiology

## 2020-03-08 VITALS — BP 168/77 | HR 70 | Resp 16 | Ht 70.0 in | Wt 204.0 lb

## 2020-03-08 DIAGNOSIS — I483 Typical atrial flutter: Secondary | ICD-10-CM

## 2020-03-08 DIAGNOSIS — I5032 Chronic diastolic (congestive) heart failure: Secondary | ICD-10-CM

## 2020-03-08 DIAGNOSIS — R0600 Dyspnea, unspecified: Secondary | ICD-10-CM

## 2020-03-08 DIAGNOSIS — I10 Essential (primary) hypertension: Secondary | ICD-10-CM

## 2020-03-08 DIAGNOSIS — R0609 Other forms of dyspnea: Secondary | ICD-10-CM

## 2020-03-08 DIAGNOSIS — N186 End stage renal disease: Secondary | ICD-10-CM

## 2020-03-08 DIAGNOSIS — I2510 Atherosclerotic heart disease of native coronary artery without angina pectoris: Secondary | ICD-10-CM

## 2020-03-08 MED ORDER — CARVEDILOL 12.5 MG PO TABS
6.2500 mg | ORAL_TABLET | Freq: Two times a day (BID) | ORAL | 3 refills | Status: DC
Start: 2020-03-08 — End: 2020-03-24

## 2020-03-08 MED ORDER — RIVAROXABAN 15 MG PO TABS: 15.0000 mg | ORAL_TABLET | Freq: Every day | ORAL | 2 refills | Status: DC

## 2020-03-08 NOTE — Patient Instructions (Signed)
Atrial Flutter  Atrial flutter is a type of abnormal heart rhythm (arrhythmia). The heart has an electrical system that tells it how to beat. In atrial flutter, the signals move rapidly in the top chambers of the heart (the atria). This makes your heart beat very fast. Atrial flutter can come and go, or it can be permanent. The goal of treatment is to prevent blood clots from forming, control your heart rate, or restore your heartbeat to a normal rhythm. If this condition is not treated, it can cause serious problems, such as a weakened heart muscle (cardiomyopathy) or a stroke. What are the causes? This condition is often caused by conditions that damage the heart's electrical system. These include:  Heart conditions and heart surgery. These include heart attacks and open-heart surgery.  Lung problems, such as COPD or a blood clot in the lung (pulmonary embolism, or PE).  Poorly controlled high blood pressure (hypertension).  Overactive thyroid (hyperthyroidism).  Diabetes. In some cases, the cause of this condition is not known. What increases the risk? You are more likely to develop this condition if:  You are an elderly adult.  You are a man.  You are overweight (obese).  You have obstructive sleep apnea.  You have a family history of atrial flutter.  You have diabetes.  You drink a lot of alcohol, especially binge drinking.  You use drugs, including cannabis.  You smoke. What are the signs or symptoms? Symptoms of this condition include:  A feeling that your heart is pounding or racing (palpitations).  Shortness of breath.  Chest pain.  Feeling dizzy or light-headed.  Fainting.  Low blood pressure (hypotension).  Fatigue.  Tiring easily during exercise or activity. In some cases, there are no symptoms. How is this diagnosed? This condition may be diagnosed with:  An electrocardiogram (ECG) to check electrical signals of the heart.  An ambulatory  cardiac monitor to record your heart's activity for a few days.  An echocardiogram to create pictures of your heart.  A transesophageal echocardiogram (TEE) to create even better pictures of your heart.  A stress test to check your blood supply while you exercise.  Imaging tests, such as a CT scan or chest X-ray.  Blood tests. How is this treated? Treatment depends on underlying conditions and how you feel when you experience atrial flutter. This condition may be treated with:  Medicines to prevent blood clots or to treat heart rate or heart rhythm problems.  Electrical cardioversion to reset the heart's rhythm.  Ablation to remove the heart tissue that sends abnormal signals.  Left atrial appendage closure to seal the area where blood clots can form. In some cases, underlying conditions will be treated. Follow these instructions at home: Medicines  Take over-the-counter and prescription medicines only as told by your health care provider.  Do not take any new medicines without talking to your health care provider.  If you are taking blood thinners: ? Talk with your health care provider before you take any medicines that contain aspirin or NSAIDs, such as ibuprofen. These medicines increase your risk for dangerous bleeding. ? Take your medicine exactly as told, at the same time every day. ? Avoid activities that could cause injury or bruising, and follow instructions about how to prevent falls. ? Wear a medical alert bracelet or carry a card that lists what medicines you take. Lifestyle  Eat heart-healthy foods. Talk with a dietitian to make an eating plan that is right for you.  Do  not use any products that contain nicotine or tobacco, such as cigarettes, e-cigarettes, and chewing tobacco. If you need help quitting, ask your health care provider.  Do not drink alcohol.  Do not use drugs, including cannabis.  Lose weight if you are overweight or obese.  Exercise  regularly as instructed by your health care provider. General instructions  Do not use diet pills unless your health care provider approves. Diet pills may make heart problems worse.  If you have obstructive sleep apnea, manage your condition as told by your health care provider.  Keep all follow-up visits as told by your health care provider. This is important. Contact a health care provider if you:  Notice a change in the rate, rhythm, or strength of your heartbeat.  Are taking a blood thinner and you notice more bruising.  Have a sudden change in weight.  Tire more easily when you exercise or do heavy work. Get help right away if you have:  Pain or pressure in your chest.  Shortness of breath.  Fainting.  Increasing sweating with no known cause.  Side effects of blood thinners, such as blood in your vomit, stool, or urine, or bleeding that cannot stop.  Any symptoms of a stroke. "BE FAST" is an easy way to remember the main warning signs of a stroke: ? B - Balance. Signs are dizziness, sudden trouble walking, or loss of balance. ? E - Eyes. Signs are trouble seeing or a sudden change in vision. ? F - Face. Signs are sudden weakness or numbness of the face, or the face or eyelid drooping on one side. ? A - Arms. Signs are weakness or numbness in an arm. This happens suddenly and usually on one side of the body. ? S - Speech. Signs are sudden trouble speaking, slurred speech, or trouble understanding what people say. ? T - Time. Time to call emergency services. Write down what time symptoms started.  Other signs of a stroke, such as: ? A sudden, severe headache with no known cause. ? Nausea or vomiting. ? Seizure.  These symptoms may represent a serious problem that is an emergency. Do not wait to see if the symptoms will go away. Get medical help right away. Call your local emergency services (911 in the [place] Do not drive yourself to the hospital. Summary  Atrial  flutter is an abnormal heart rhythm that can give you symptoms of palpitations, shortness of breath, or fatigue.  Atrial flutter is often treated with medicines to keep your heart in a normal rhythm and to prevent a stroke.  Get help right away if you cannot catch your breath, or have chest pain or pressure.  Get help right away if you have signs or symptoms of a stroke. This information is not intended to replace advice given to you by your health care provider. Make sure you discuss any questions you have with your health care provider. Document Revised: 09/16/2018 Document Reviewed: 09/16/2018 Elsevier Patient Education  [place].

## 2020-03-08 NOTE — Progress Notes (Signed)
Primary Physician/Referring:  Lorenda Hatchet, FNP  [name] ID: [name], male    DOB: 1951/04/01, 69 y.o.   MRN: [id number]  Chief Complaint  [name] presents with  . Follow-up    1 year  . Coronary Artery Disease  . Hypertension   HPI:    [name]  is a 69 y.o.  Caucasian male with CABG in [place] 02/18/2004: RIMA to LAD, LIMA to obtuse marginal, SVG to diagonal and SVG to PDA, who has diabetes mellitus, diabetic nephropathy with ESRD on HD, peripheral neuropathy and diabetic foot ulcer right, S/P Sim's amputation of the right foot and now has chronic ulcer.  [name] just finished prolonged antibiotic therapy for right foot osteomyelitis in November 2021.  [name] has chronic bilateral leg edema and positive venous insufficiency of left leg by insufficiency study in Sept 2019, amputation of his left 2 toes due to diabetic foot ulcer in the past.  [name] is now here in the office for follow-up of coronary disease, elevated blood pressure last few months and marked fatigue and dyspnea and markedly decreased exercise tolerance over the past 6 weeks or so.  No chest pain, no PND or orthopnea.  [name] was evaluated by pulmonary medicine and chest x-ray reviewed bilateral pleural effusion and is being evaluated for CHF and echo has been ordered.  Past Medical History:  Diagnosis Date  . Ambulates with cane    straight cane  . Cervical myelopathy ([place]) 02/06/2018  . Chronic kidney disease    dailysis M W F- home  . Complication of anesthesia   . Coronary artery disease   . Diabetes mellitus without complication ([place])    type2  . Diabetic foot ulcer ([place])   . Diabetic foot ulcer ([place])   . Diabetic neuropathy ([place]) 02/06/2018  . Gait abnormality 08/24/2018  . GERD (gastroesophageal reflux disease)     01/06/20- not current  . Gout   . History of kidney stones    passed stones  . Hypercholesteremia   . Hypertension   . Hypothyroidism   . Neuromuscular disorder (HCC)    neuropathy left leg and  bilateral feet  . Neuropathy   . PONV (postoperative nausea and vomiting)   . Prostate cancer [facility])    Past Surgical History:  Procedure Laterality Date  . AMPUTATION Left 12/25/2013   Procedure: AMPUTATION RAY LEFT 5TH RAY;  Surgeon: [name], MD;  Location: [facility];  Service: Orthopedics;  Laterality: Left;  . AMPUTATION Right 12/15/2018   Procedure: AMPUTATION OF 4TH AND 5TH TOES RIGHT FOOT;  Surgeon: [name], MD;  Location: [place];  Service: Orthopedics;  Laterality: Right;  . APPLICATION OF WOUND VAC Right 12/15/2018   Procedure: APPLICATION OF WOUND VAC;  Surgeon: [name], MD;  Location: [place];  Service: Orthopedics;  Laterality: Right;  . AV FISTULA PLACEMENT Left 03/25/2019   Procedure: LEFT ARM ARTERIOVENOUS (AV) FISTULA CREATION;  Surgeon: [name], MD;  Location: [place];  Service: Vascular;  Laterality: Left;  . BACK SURGERY    . BASCILIC VEIN TRANSPOSITION Left 05/20/2019   Procedure: SECOND STAGE LEFT BASCILIC VEIN TRANSPOSITION;  Surgeon: [name], MD;  Location: [place];  Service: Vascular;  Laterality: Left;  . CARDIAC CATHETERIZATION  02/17/2014  . CHOLECYSTECTOMY    . COLONOSCOPY  2011   in [place], [place] GRAFT  2008  . I & D EXTREMITY Right 12/15/2018   Procedure: DEBRIDEMENT RIGHT  FOOT;  Surgeon: [name], MD;  Location: [place];  Service: Orthopedics;  Laterality: Right;  . I & D EXTREMITY Right 08/27/2019   Procedure: PARTIAL CUBOID EXCISION RIGHT FOOT;  Surgeon: [name], MD;  Location: [place];  Service: Orthopedics;  Laterality: Right;  . I & D EXTREMITY Right 01/07/2020   Procedure: RIGHT FOOT EXCISION INFECTED BONE;  Surgeon: [name], MD;  Location: [place];  Service: Orthopedics;  Laterality: Right;  . NECK SURGERY     novemver 2019  . WISDOM TOOTH EXTRACTION     Social History   Tobacco Use  . Smoking status: Former Smoker    Years: 25.00    Types: Cigars    Start date: 04/08/1973     Quit date: 12/24/1988    Years since quitting: 31.2  . Smokeless tobacco: Never Used  . Tobacco comment: Cigars and Pipe   Substance Use Topics  . Alcohol use: No    ROS  Review of Systems  Constitutional: Positive for malaise/fatigue and weight loss. Negative for weight gain.  Cardiovascular: Negative for dyspnea on exertion, leg swelling and syncope.  Respiratory: Negative for hemoptysis.   Endocrine: Negative for cold intolerance.  Hematologic/Lymphatic: Does not bruise/bleed easily.  Skin:       Right foot ulcer (not examined) and right shin skin tear after a recent trauma.  Gastrointestinal: Negative for hematochezia and melena.  Neurological: Positive for light-headedness and paresthesias. Negative for headaches.   Objective  Blood pressure (!) 168/77, pulse 70, resp. rate 16, height 5\' 10"  (1.778 m), weight 204 lb (92.5 kg), SpO2 96 %.  Vitals with BMI 03/08/2020 02/28/2020 02/24/2020  Height 5\' 10"  - 5\' 10"   Weight 204 lbs 199 lbs 204 lbs  BMI 29.27 97.58 83.25  Systolic 498 264 158  Diastolic 77 78 82  Pulse 70 69 72     Physical Exam Constitutional:      Comments: Ill looking  HENT:     Head: Atraumatic.  Eyes:     Conjunctiva/sclera: Conjunctivae normal.  Neck:     Thyroid: No thyromegaly.     Vascular: No JVD.  Cardiovascular:     Rate and Rhythm: Normal rate and regular rhythm.     Pulses:          Carotid pulses are 2+ on the right side and 2+ on the left side.      Femoral pulses are 2+ on the right side and 2+ on the left side.      Popliteal pulses are 2+ on the right side and 2+ on the left side.       Dorsalis pedis pulses are 1+ on the right side and 1+ on the left side.       Posterior tibial pulses are 1+ on the right side and 1+ on the left side.     Heart sounds: Murmur heard.  Holosystolic murmur is present with a grade of 2/6. Through out the precordium related to AV shunt left arm.    No gallop.      Comments: Has ulcers on right foot,  covered by tape and per wife and [name] has no discharge and is almost healed. Thick rigid nails and onychomycosis (chronic).  No leg edema. Capillary refill < 3 Sec.  Left arm AV fistula for dialysis patent. Pulmonary:     Effort: Pulmonary effort is normal.     Breath sounds: Normal breath sounds.  Abdominal:     General:  Bowel sounds are normal.     Palpations: Abdomen is soft.  Musculoskeletal:        General: Normal range of motion.     Cervical back: Neck supple.  Skin:    General: Skin is warm and dry.  Neurological:     Mental Status: [name] is alert.    Laboratory examination:   Recent Labs    08/27/19 0728 01/07/20 1048 02/28/20 1522  NA 136 141 141  K 3.7 4.2 4.1  CL 96* 100 100  CO2  --   --  30  GLUCOSE 144* 93 121*  BUN 44* 33* 36*  CREATININE 8.80* 5.70* 5.97*  CALCIUM  --   --  9.1   estimated creatinine clearance is 13.2 mL/min (A) (by C-G formula based on SCr of 5.97 mg/dL (H)).  CMP Latest Ref Rng & Units 02/28/2020 01/07/2020 08/27/2019  Glucose 65 - 99 mg/dL 121(H) 93 144(H)  BUN 7 - 25 mg/dL 36(H) 33(H) 44(H)  Creatinine 0.70 - 1.18 mg/dL 5.97(H) 5.70(H) 8.80(H)  Sodium 135 - 146 mmol/L 141 141 136  Potassium 3.5 - 5.3 mmol/L 4.1 4.2 3.7  Chloride 98 - 110 mmol/L 100 100 96(L)  CO2 20 - 32 mmol/L 30 - -  Calcium 8.6 - 10.3 mg/dL 9.1 - -  Total Protein 6.5 - 8.1 g/dL - - -  Total Bilirubin 0.3 - 1.2 mg/dL - - -  Alkaline Phos 38 - 126 U/L - - -  AST 15 - 41 U/L - - -  ALT 0 - 44 U/L - - -   CBC Latest Ref Rng & Units 02/28/2020 01/07/2020 12/27/2019  WBC 3.8 - 10.8 Thousand/uL 4.5 - 6.0  Hemoglobin 13.2 - 17.1 g/dL 10.1(L) 10.5(L) 10.8(L)  Hematocrit 38 - 50 % 31.3(L) 31.0(L) 33.9(L)  Platelets 140 - 400 Thousand/uL 89(L) - 180   HEMOGLOBIN A1C Lab Results  Component Value Date   HGBA1C 6.3 (H) 12/14/2018   MPG 134.11 12/14/2018   External labs:   Labs 08/25/2019:  Total cholesterol 109, triglycerides 331, HDL 25, LDL 43.  Non-HDL  cholesterol 84.  Labs 02/25/2020: A1c 4.7%.  TSH normal.  Labs 02/17/2020:  Hb 10.4/HCT 31.5, platelets 146, normal indicis.  Sodium 139, potassium 4.5, BUN 46, creatinine 5.88.  Total bilirubin 0.69.  Albumin 4.2.  ALT 14.  AST 19.  Medications and allergies   Allergies  Allergen Reactions  . Mushroom Extract Complex Nausea Only    Current Outpatient Medications on File Prior to Visit  Medication Sig Dispense Refill  . allopurinol (ZYLOPRIM) 100 MG tablet Take 200 mg by mouth daily.     . B Complex-C-Zn-Folic Acid (DIALYVITE 623 WITH ZINC) 0.8 MG TABS Take 1 tablet by mouth daily.    . benzonatate (TESSALON) 200 MG capsule Take 1 capsule (200 mg total) by mouth 3 (three) times daily as needed for cough. 90 capsule 0  . calcitRIOL (ROCALTROL) 0.5 MCG capsule Take 0.5 mcg by mouth daily.     . chlorhexidine (PERIDEX) 0.12 % solution 15 mLs by Mouth Rinse route daily.    . diclofenac Sodium (VOLTAREN) 1 % GEL Apply 1 application topically daily as needed (pain).     . famotidine (PEPCID) 20 MG tablet Take 1 tablet (20 mg total) by mouth at bedtime. 30 tablet 3  . HYDROcodone-acetaminophen (NORCO/VICODIN) 5-325 MG tablet Take 1 tablet by mouth every 6 (six) hours as needed.    . Lancets (ONETOUCH DELICA PLUS [id number]) [place]     .  levothyroxine (SYNTHROID, LEVOTHROID) 112 MCG tablet Take 112 mcg by mouth daily before breakfast. BRAND NAME SYNTHROID    . Methoxy PEG-Epoetin Beta (MIRCERA IJ) Inject into the skin.    Marland Kitchen omeprazole (PRILOSEC) 40 MG capsule Take 1 capsule (40 mg total) by mouth daily. 30 capsule 3  . oxyCODONE-acetaminophen (PERCOCET/ROXICET) 5-325 MG tablet Take 1 tablet by mouth every 4 (four) hours as needed. 30 tablet 0  . pregabalin (LYRICA) 150 MG capsule Take 150 mg by mouth in the morning and at bedtime.     . rosuvastatin (CRESTOR) 10 MG tablet Take 10 mg by mouth daily.     . Semaglutide, 1 MG/DOSE, (OZEMPIC, 1 MG/DOSE,) 2 MG/1.5ML SOPN Inject 0.5 mg into the skin  every Friday. Takes in the evening    . sevelamer carbonate (RENVELA) 800 MG tablet Take 2,400 mg by mouth 3 (three) times daily.    Marland Kitchen zinc gluconate 50 MG tablet Take 50 mg by mouth daily.     No current facility-administered medications on file prior to visit.     Radiology:   CXR 02/25/2020: Persistent small bilateral pleural effusions, slightly increased on the left.  Cardiac Studies:   Echo- 06/08/13 1. Left ventricular cavity is normal in size. Mild concentric hypertrophy. Normal global wall motion. Normal systolic global function. Calculated EF 57%. Visual EF is 60-65%. 2. Left atrial cavity is mildly dilated. Mild aneurysmal motion of the interatrial septum. 3. Mild aortic valve leaflet calcification. 4. Mild calcification of the mitral annulus. Mild mitral regurgitation. 5. Tricuspid valve structurally normal. Mild tricuspid regurgitation.  Lexiscan sestamibi stress test 05/28/2013: 1. Resting EKG NSR, Poor R wave progression. Low voltage complexes. Stress EKG was non diagnostic for ischemia. No ST-T changes of ischemia noted with pharmacologic stress testing.  2. The perfusion study demonstrated normal isotope uptake both at rest and stress. There was no evidence of ischemia or scar. Dynamic gated images reveal normal wall motion and endocardial thickening. Left ventricular ejection fraction was estimated to be 67%.  Lower extremity arterial duplex 05/12/2014:  No hemodynamically significant stenoses are identified in the bilateral lower extremity arterial system.This exam reveals normal perfusion of both the lower extremities with RABI 1.04 and LABI 1.09. There is mild diffuse disease involving the small vessels below the knee. Compared to the study done on 06/08/2013, no significant change. Impression: Lower extremity venous insufficiency study 12/25/2016: No venous insufficiency or DVT in the right lower extremity. Reflexes in the left greater saphenous vein beginning in the distal  thigh and involving the entire length of the calf. No obvious varicosities.  Carotid artery duplex 01/27/2018: Stenosis in the bilateral internal carotid artery (16-49%), lower end of spectrum with heteregenous plaque. Right external carotid stenosis <50%. Antegrade right vertebral artery flow. Antegrade left vertebral artery flow. Follow up in one year is appropriate if clinically indicated. Compared to 12/04/2016, no significant change.  ABI 12/16/2018: Right: Resting right ankle-brachial index is within normal range. No evidence of significant right lower extremity arterial disease. Left: Resting left ankle-brachial index indicates noncompressible left lower extremity arteries.   EKG   EKG 03/08/2020: Typical atrial flutter with 3: 1 conduction with ventricular rate of 67 bpm, normal axis.  EKG 05/03/2019: Sinus rhythm with borderline first-degree AV block, normal axis, incomplete right bundle branch block.  Poor R progression, probably normal variant.  No evidence of ischemia.No significant change from EKG 05/18/2018   Assessment     ICD-10-CM   1. Typical atrial flutter (HCC)  I48.3 PCV  MYOCARDIAL PERFUSION WITH LEXISCAN    PCV ECHOCARDIOGRAM COMPLETE    Rivaroxaban (XARELTO) 15 MG TABS tablet    Ambulatory referral to Cardiac Electrophysiology  2. Coronary artery disease involving native coronary artery of native heart without angina pectoris  I25.10 EKG 12-Lead    PCV MYOCARDIAL PERFUSION WITH LEXISCAN    PCV ECHOCARDIOGRAM COMPLETE  3. Dyspnea on exertion  R06.00 Pulse oximetry, overnight  4. Chronic diastolic heart failure (HCC)  I50.32 Pulse oximetry, overnight    PCV MYOCARDIAL PERFUSION WITH LEXISCAN  5. Essential hypertension  I10   6. ESRD (end stage renal disease) (HCC)  N18.6    CHA2DS2-VASc Score is 4.  Yearly risk of stroke: 4.8% (A, HTN, DM, Vasc Dz).  Score of 1=0.6; 2=2.2; 3=3.2; 4=4.8; 5=7.2; 6=9.8; 7=>9.8) -(CHF; HTN; vasc disease DM,  Male = 1; Age <65  =0; 65-74 = 1,  >75 =2; stroke/embolism= 2).    Meds ordered this encounter  Medications  . Rivaroxaban (XARELTO) 15 MG TABS tablet    Sig: Take 1 tablet (15 mg total) by mouth daily with supper.    Dispense:  30 tablet    Refill:  2  . carvedilol (COREG) 12.5 MG tablet    Sig: Take 0.5 tablets (6.25 mg total) by mouth 2 (two) times daily.    Dispense:  180 tablet    Refill:  3    Medications Discontinued During This Encounter  Medication Reason  . aspirin EC 81 MG tablet Change in therapy  . insulin degludec (TRESIBA FLEXTOUCH) 200 UNIT/ML FlexTouch Pen Error  . Ibuprofen-Acetaminophen (ADVIL DUAL ACTION) 125-250 MG TABS Discontinued by provider  . carvedilol (COREG) 6.25 MG tablet Reorder    Recommendations:   Carlis Blanchard  is a 69 y.o. Caucasian male with CABG in [place] 02/18/2004: RIMA to LAD, LIMA to obtuse marginal, SVG to diagonal and SVG to PDA, who has diabetes mellitus, diabetic nephropathy with ESRD on HD, peripheral neuropathy and diabetic foot ulcer right, S/P Sim's amputation of the right foot and now has chronic ulcer.  [name] just finished prolonged antibiotic therapy for right foot osteomyelitis in November 2021.  [name] has chronic bilateral leg edema and positive venous insufficiency of left leg by insufficiency study in Sept 2019, amputation of his left 2 toes due to diabetic foot ulcer in the past.  [name] is now here in the office for follow-up of coronary disease, elevated blood pressure last few months and marked fatigue and dyspnea and markedly decreased exercise tolerance over the past 6 weeks or so.  [name] symptoms are most probably related to new onset of atrial flutter.  I have started him on Xarelto 15 mg p.o. nightly after dinner, will also increase carvedilol to 12.5 mg p.o. twice daily for elevated blood pressure.  I will set him up for direct-current cardioversion in 3 weeks, however I would like to see him back in 2 weeks for follow-up.  Initially I was thinking of  performing nocturnal oximetry however after review of his EKG, suspect his fatigue is related to underlying atrial arrhythmia.  [name] needs echocardiogram and also Lexiscan nuclear stress test to exclude ischemic etiology.  I have advised him to discontinue ibuprofen and also aspirin to reduce risk of bleeding.  I would like to see him back in 2 weeks although I have set him up for direct-current cardioversion in 3 weeks.  In view of typical atrial flutter, [name] would also benefit from atrial flutter ablation so long-term anticoagulation can be avoided  to reduce risk of bleed.  A referral for EP has been made.  This was a 40-minute office visit encounter with complex decision making and additional 10 minutes for documentation and review of his external records and external labs.  His wife is present and all questions answered.   Adrian Prows, MD, [facility] 03/08/2020, 10:04 PM Office: [phone] Pager: [phone]   CC: Madelon Lips, MD (Nephro), Christinia Gully, MD (Pul), Caryl Ada, FNP

## 2020-03-16 ENCOUNTER — Other Ambulatory Visit: Payer: Self-pay

## 2020-03-16 ENCOUNTER — Ambulatory Visit (INDEPENDENT_AMBULATORY_CARE_PROVIDER_SITE_OTHER): Payer: Medicare Other | Admitting: Orthopedic Surgery

## 2020-03-16 ENCOUNTER — Ambulatory Visit (INDEPENDENT_AMBULATORY_CARE_PROVIDER_SITE_OTHER): Payer: Medicare Other | Admitting: Internal Medicine

## 2020-03-16 ENCOUNTER — Encounter: Payer: Self-pay | Admitting: Internal Medicine

## 2020-03-16 DIAGNOSIS — R053 Chronic cough: Secondary | ICD-10-CM | POA: Diagnosis not present

## 2020-03-16 DIAGNOSIS — Z89421 Acquired absence of other right toe(s): Secondary | ICD-10-CM

## 2020-03-16 DIAGNOSIS — J90 Pleural effusion, not elsewhere classified: Secondary | ICD-10-CM

## 2020-03-16 DIAGNOSIS — I6523 Occlusion and stenosis of bilateral carotid arteries: Secondary | ICD-10-CM

## 2020-03-16 MED ORDER — BENZONATATE 200 MG PO CAPS
200.0000 mg | ORAL_CAPSULE | Freq: Three times a day (TID) | ORAL | 2 refills | Status: DC | PRN
Start: 2020-03-16 — End: 2020-04-14

## 2020-03-16 NOTE — Assessment & Plan Note (Signed)
New finding  01/27/20 in HD pt  - [place] w/u 02/2020 dx afib  Plan is cardioversion then return p first of year to see if effusion resolve and if not can consider tapping the greater of the two  Discussed in detail all the  indications, usual  risks and alternatives  relative to the benefits with patient who agrees to proceed with w/u as outlined.            Each maintenance medication was reviewed in detail including emphasizing most importantly the difference between maintenance and prns and under what circumstances the prns are to be triggered using an action plan format where appropriate.  Total time for H and P, chart review, counseling, teaching device and generating customized AVS unique to this office visit / charting =  22 min

## 2020-03-16 NOTE — Patient Instructions (Signed)
Keep appt for January to follow up the fluid in your lungs after they cardiovert    No change in medications from my perspective in the meantime

## 2020-03-16 NOTE — Progress Notes (Signed)
[name], male    DOB: 12/31/1950,     MRN: [id number]   Brief patient profile:  80 yowm quit smoking 1990 no sequelae with  hbp /dm/ IHD s/p cabg 2005 [place], [place] on HD since early  of Jan 2021 non-stop since so referred to pulmonary clinic 06/25/2019 by Dr   [name]    History of Present Illness  06/25/2019  Pulmonary/ 1st office eval/Taniela [name]  Chief Complaint  Patient presents with  . Pulmonary Consult    Referred by Dr [name]. Pt c/o cough x 3 months- non prod.   Dyspnea:  Not limited by breathing from desired activities  Including wt lifting  Cough: indolent onset 3 m prior to OV   of persitent daily dry tickle in throat not really much production at all, no assoc nasal symptoms or prior h/o seasonal rhinitis or even much problem with colds in past. Sleep: at hs worse ? Better p inhaler, tessalon immediately on waking/ bed is flat with two big pillws and dog in bedroom  SABA use: 4 total per per day  Lots of halls not helping  Kouffman Reflux v Neurogenic Cough Differentiator Reflux Comments  Do you awaken from a sound sleep coughing violently?                            With trouble breathing? No sometimes   Do you have choking episodes when you cannot  Get enough air, gasping for air ?              When it's severe   Do you usually cough when you lie down into  The bed, or when you just lie down to rest ?                          Yes   Do you usually cough after meals or eating?         No   Do you cough when (or after) you bend over?    no   GERD SCORE     Kouffman Reflux v Neurogenic Cough Differentiator Neurogenic   Do you more-or-less cough all day long? sporadic   Does change of temperature make you cough? Cold    Does laughing or chuckling cause you to cough? no   Do fumes (perfume, automobile fumes, burned  Toast, etc.,) cause you to cough ?      no   Does speaking, singing, or talking on the phone cause you to cough   ?               [name]     Neurogenic/Airway score    REC Pantoprazole (protonix) 40 mg   Take  30-60 min before first meal of the day and Pepcid (famotidine)  20 mg one after supper  until return to office  For cough > tessalon 200 mg every 6-8 hours to eliminate all the coughing  gerd diet  Depomedrol 120 mg IM today        07/23/2019  f/u ov/[name] re: cough since first of 2021  Chief Complaint  Patient presents with  . Follow-up    Cough has resolved. He is taking the tessalon about 2 x per day. He has not had to use his albuterol.   Dyspnea:  Not limited by breathing from desired activities  No real aerobics Cough: gone but still using tessalon bid Sleeping:  slt elevation  pillows  SABA use: none  02: none rec Tessalon and f/u in 6 weeks > did not return       NP  Ov 01/28/20 2-3 weeks  Dry cough  Worse at night Using tessalon pearles every night  Worsened shortness of breath at night   Stopped reflux meds in June  2021   1. Chronic cough  - omeprazole (PRILOSEC) 40 MG capsule; Take 1 capsule (40 mg total) by mouth daily.  Dispense: 30 capsule; Refill: 3 - famotidine (PEPCID) 20 MG tablet; Take 1 tablet (20 mg total) by mouth at bedtime.  Dispense: 30 tablet; Refill: 3 - DG Chest 2 View; Future  Restart omeprazole 40 mg tablet  >>>Please take 1 tablet daily 15 minutes to 30 minutes before your first meal of the day as well as before your other medications >>>Try to take at the same time each day >>>take this medication daily  Restart Pepcid 20 mg at night  GERD management: >>>Avoid laying flat until 2 hours after meals >>>Elevate head of the bed including entire chest >>>Reduce size of meals and amount of fat, acid, spices, caffeine and sweets >>>If you are smoking, Please stop! >>>Decrease alcohol consumption >>>Work on maintaining a healthy weight with normal BMI   Cough Home Instructions:  We believe you have a chronic/cyclical cough that is aggravated by reflux , coughing ,  and drainage.  . Goal is to not Cough or clear throat.  Marland Kitchen Avoid coughing or clearing throat by using:  o non-mint products/sugarless candy o Water o ice chips o Remember NO MINT PRODUCTS  . Medications to use:  o Mucinex DM 1-2 every 12 hrs or Delsym 2 tsp every 12 hrs for cough (These are Over the counter) o Tessalon Three times a day  As needed  Cough.  o Prilosec 40mg  30 min before breakfast or dinner.  o Pepcid 20mg  before dinner  o Zyrtec 10mg  at bedtime (Can use generic, this is over the counter) o Chlor tabs 4mg  2 at bedtime  for nasal drip until cough is 100% cough free. (this medication is over the counter)    02/24/2020  Acute  ov/[name] office/[name] re: severe  cough since early oct 2021  Chief Complaint  Patient presents with  . Follow-up    non productive cough gets SOB when coughing  previous cough 100% gone until returned to in center HD now back at home  Just started 02/11/20  Dyspnea: worse lying down / no problem walking with cane  Cough: after supper seems worse/ hacking dry cough Sleeping: flat bed/ 2 pillows under  head  SABA use: no better p saba  02: none  Not using 1st gen H1 blockers per guidelines and using mints (mentos) rec Pepcid 20 mg after supper   Omeprazole 40 mg Take 30-60 min before first meal of the day  GERD  Depomedrol 120 mg IM  Tessalon 200 mg about  Every 8 hours around the clock and supplement if needed  with either vicodin or percocet every 4 hours  As needed x 3 straight days For drainage / throat tickle try take CHLORPHENIRAMINE  4 mg  Please remember to go to the  x-ray department  > bilateral effusions    Keep the [name] appointment  Add: needs echo to eval effusions > [name] dx afib, planning cardioversion    03/16/2020  f/u ov/[name] re:  Home hemodialysis new afib with fatigue > sob and cough is better  Chief Complaint  Patient presents with  . Follow-up    Pt cough improved some. He notices most at night and early  am- non prod.    Dyspnea:  Not limited by breathing R foot/ 4 pronged walker Cough: dry cough mostly daytime, tessalon helps ? If better with 1st gen H1 blockers per guidelines  And  no noct doses of chlorpheniramine needed / uses daytime doesn't think it makes him sleepy,   Sleeping: flat bed/ 2 pillows better  SABA use: none 02: none    No obvious day to day or daytime variability or assoc excess/ purulent sputum or mucus plugs or hemoptysis or cp or chest tightness, subjective wheeze or overt sinus or hb symptoms.   sleeping without nocturnal  or early am exacerbation  of respiratory  c/o's or need for noct saba. Also denies any obvious fluctuation of symptoms with weather or environmental changes or other aggravating or alleviating factors except as outlined above   No unusual exposure hx or h/o childhood pna/ asthma or knowledge of premature birth.  Current Allergies, Complete Past Medical History, Past Surgical History, Family History, and Social History were reviewed in [employer] record.  ROS  The following are not active complaints unless bolded Hoarseness, sore throat, dysphagia, dental problems, itching, sneezing,  nasal congestion or discharge of excess mucus or purulent secretions, ear ache,   fever, chills, sweats, unintended wt loss or wt gain, classically pleuritic or exertional cp,  orthopnea pnd or arm/hand swelling  or leg swelling, presyncope, palpitations, abdominal pain, anorexia, nausea, vomiting, diarrhea  or change in bowel habits or change in bladder habits, change in stools or change in urine, dysuria, hematuria,  rash, arthralgias, visual complaints, headache, numbness, weakness or ataxia or problems with walking or coordination,  change in mood or  memory.        Current Meds  Medication Sig  . allopurinol (ZYLOPRIM) 100 MG tablet Take 200 mg by mouth daily.   . B Complex-C-Zn-Folic Acid (DIALYVITE 937 WITH ZINC) 0.8 MG TABS Take 1 tablet by  mouth daily.  . benzonatate (TESSALON) 200 MG capsule Take 1 capsule (200 mg total) by mouth 3 (three) times daily as needed for cough.  . calcitRIOL (ROCALTROL) 0.5 MCG capsule Take 0.5 mcg by mouth daily.   . carvedilol (COREG) 12.5 MG tablet Take 0.5 tablets (6.25 mg total) by mouth 2 (two) times daily.  . chlorhexidine (PERIDEX) 0.12 % solution 15 mLs by Mouth Rinse route daily.  . chlorpheniramine (CHLOR-TRIMETON) 4 MG tablet Take 4 mg by mouth every 4 (four) hours as needed for allergies.  Marland Kitchen diclofenac Sodium (VOLTAREN) 1 % GEL Apply 1 application topically daily as needed (pain).   . famotidine (PEPCID) 20 MG tablet Take 1 tablet (20 mg total) by mouth at bedtime.  Marland Kitchen HYDROcodone-acetaminophen (NORCO/VICODIN) 5-325 MG tablet Take 1 tablet by mouth every 6 (six) hours as needed.  . Lancets (ONETOUCH DELICA PLUS [id number]) MISC   . levothyroxine (SYNTHROID, LEVOTHROID) 112 MCG tablet Take 112 mcg by mouth daily before breakfast. BRAND NAME SYNTHROID  . Methoxy PEG-Epoetin Beta (MIRCERA IJ) Inject into the skin.  Marland Kitchen omeprazole (PRILOSEC) 40 MG capsule Take 1 capsule (40 mg total) by mouth daily.  Marland Kitchen oxyCODONE-acetaminophen (PERCOCET/ROXICET) 5-325 MG tablet Take 1 tablet by mouth every 4 (four) hours as needed.  . pregabalin (LYRICA) 150 MG capsule Take 150 mg by mouth in the morning and at bedtime.   . Rivaroxaban (XARELTO) 15 MG TABS  tablet Take 1 tablet (15 mg total) by mouth daily with supper.  . rosuvastatin (CRESTOR) 10 MG tablet Take 10 mg by mouth daily.   . Semaglutide, 1 MG/DOSE, (OZEMPIC, 1 MG/DOSE,) 2 MG/1.5ML SOPN Inject 0.5 mg into the skin every Friday. Takes in the evening  . sevelamer carbonate (RENVELA) 800 MG tablet Take 2,400 mg by mouth 3 (three) times daily.  Marland Kitchen zinc gluconate 50 MG tablet Take 50 mg by mouth daily.            Past Medical History:  Diagnosis Date  . Ambulates with cane    straight cane  . Cervical myelopathy ([place]) 02/06/2018  . Chronic kidney  disease    dailysis M W F  . Coronary artery disease   . Diabetes mellitus without complication ([place])    type2  . Diabetic foot ulcer ([place])   . Diabetic foot ulcer ([place])   . Diabetic neuropathy ([place]) 02/06/2018  . Gait abnormality 08/24/2018  . GERD (gastroesophageal reflux disease)   . History of kidney stones    passed stones  . Hypercholesteremia   . Hypertension   . Hypothyroidism   . Neuromuscular disorder (HCC)    neuropathy left leg and bilateral feet  . Neuropathy   . Prostate cancer ([place])        Objective:      03/16/2020       199 02/24/2020     204   07/23/19 203 lb 12.8 oz (92.4 kg)  07/20/19 209 lb 3.2 oz (94.9 kg)  06/28/19 200 lb (90.7 kg)     Vital signs reviewed  03/16/2020  - Note at rest 02 sats  96% on RA    Somber amb nad slow walk with 4 pronged walker    HEENT : pt wearing mask not removed for exam due to covid -19 concerns.    NECK :  without JVD/Nodes/TM/ nl carotid upstrokes bilaterally   LUNGS: no acc muscle use,  Nl contour chest dullness and decrease BS bases  bilaterally without cough on insp or exp maneuvers   CV:  RRR (not irrreg)  no s3 or murmur or increase in P2, and no edema   ABD:  soft and nontender with nl inspiratory excursion in the supine position. No bruits or organomegaly appreciated, bowel sounds nl  MS:    ext warm without deformities, calf tenderness, cyanosis or clubbing R foot brace in place    SKIN: warm and dry without lesions    NEURO:  alert, approp, nl sensorium with  no motor or cerebellar deficits apparent.              Assessment

## 2020-03-16 NOTE — Assessment & Plan Note (Signed)
Onset around 1st of 2021  - Allergy profile 06/25/2019 >  Eos 0.2 /  IgE 10  RAST neg  - max gerd rx 06/25/2019 > much better 07/23/2019 rec taper tessalon down/ off - recurrent cough while off gerd rx early oct 2021 assoc with new bilateral pl effusions - cyclical cough regimen 09/81/1914   Cause and effect have not been established between the afib/effusions/cough so no change in pulmonary rx and f/u p cardioversion to see how many of his symptoms respond to back in SR (if can maintain it) .  In meantime rec try less H1 so see if daytime fatigue improves and if so try less sedating antihistamine

## 2020-03-17 ENCOUNTER — Ambulatory Visit: Payer: Medicare Other

## 2020-03-17 ENCOUNTER — Other Ambulatory Visit (HOSPITAL_COMMUNITY): Payer: Medicare Other

## 2020-03-17 ENCOUNTER — Encounter: Payer: Self-pay | Admitting: Orthopedic Surgery

## 2020-03-17 DIAGNOSIS — I2510 Atherosclerotic heart disease of native coronary artery without angina pectoris: Secondary | ICD-10-CM

## 2020-03-17 DIAGNOSIS — I483 Typical atrial flutter: Secondary | ICD-10-CM

## 2020-03-17 NOTE — Progress Notes (Signed)
Office Visit Note   Patient: [name]           Date of Birth: 1950-06-23           MRN: [id number] Visit Date: 03/16/2020              Requested by: Lorenda Hatchet, [place] [address],  [place] [address] PCP: Lorenda Hatchet, FNP  Chief Complaint  Patient presents with  . Right Foot - Follow-up      HPI: Patient is a 69 year old gentleman who presents in follow-up status post fourth and fifth ray amputations right foot he has been using DuoDERM over the surgical incision he has a posterior AFO  Assessment & Plan: Visit Diagnoses:  1. History of partial ray amputation of fourth toe of right foot ([place])     Plan: Recommended using a wider piece of the DuoDERM.  Follow-up if there is any acute change in his foot.  Follow-Up Instructions: Return if symptoms worsen or fail to improve.   Ortho Exam  Patient is alert, oriented, no adenopathy, well-dressed, normal affect, normal respiratory effort. Examination the incision is well approximated there is no redness no cellulitis no drainage no signs of infection.  His foot is plantigrade.  Imaging: No results found. No images are attached to the encounter.  Labs: Lab Results  Component Value Date   HGBA1C 6.3 (H) 12/14/2018   HGBA1C 6.0 (H) 12/24/2015   HGBA1C 7.0 (H) 12/25/2013   ESRSEDRATE 34 (H) 01/24/2020   ESRSEDRATE 41 (H) 01/10/2020   ESRSEDRATE 55 (H) 12/27/2019   CRP 8.8 (H) 01/24/2020   CRP 35.9 (H) 01/10/2020   CRP 77.9 (H) 12/27/2019   REPTSTATUS 01/15/2020 FINAL 01/07/2020   GRAMSTAIN  01/07/2020    FEW WBC PRESENT, PREDOMINANTLY PMN NO ORGANISMS SEEN    CULT  01/07/2020    RARE STAPHYLOCOCCUS HAEMOLYTICUS RARE CORYNEBACTERIUM STRIATUM Standardized susceptibility testing for this organism is not available. RARE STENOTROPHOMONAS MALTOPHILIA CRITICAL RESULT CALLED TO, READ BACK BY AND VERIFIED WITH: DR Zaydee Aina VIA EPIC [id number] [id number] FCP NO ANAEROBES ISOLATED Performed at [facility], [place] [address] [place], [place] [address]    LABORGA STAPHYLOCOCCUS HAEMOLYTICUS 01/07/2020   LABORGA STENOTROPHOMONAS MALTOPHILIA 01/07/2020     Lab Results  Component Value Date   ALBUMIN 3.0 (L) 12/14/2018   ALBUMIN 3.7 12/24/2015   ALBUMIN 3.7 12/28/2014   PREALBUMIN 14.8 (L) 12/14/2018    No results found for: MG No results found for: VD25OH  Lab Results  Component Value Date   PREALBUMIN 14.8 (L) 12/14/2018   CBC EXTENDED Latest Ref Rng & Units 02/28/2020 01/07/2020 12/27/2019  WBC 3.8 - 10.8 Thousand/uL 4.5 - 6.0  RBC 4.20 - 5.80 Million/uL 3.47(L) - 3.85(L)  HGB 13.2 - 17.1 g/dL 10.1(L) 10.5(L) 10.8(L)  HCT 38.5 - 50.0 % 31.3(L) 31.0(L) 33.9(L)  PLT 140 - 400 Thousand/uL 89(L) - 180  NEUTROABS 1,500 - 7,800 cells/uL - - -  LYMPHSABS 850 - 3,900 cells/uL - - -     There is no height or weight on file to calculate BMI.  Orders:  No orders of the defined types were placed in this encounter.  No orders of the defined types were placed in this encounter.    Procedures: No procedures performed  Clinical Data: No additional findings.  ROS:  All other systems negative, except as noted in the HPI. Review of Systems  Objective: Vital Signs: There were no vitals taken  for this visit.  Specialty Comments:  No specialty comments available.  PMFS History: Patient Active Problem List   Diagnosis Date Noted  . Bilateral pleural effusion 02/24/2020  . Healthcare maintenance 01/28/2020  . Shortness of breath 01/28/2020  . History of partial ray amputation of fourth toe of right foot ([place]) 09/08/2019  . Ulcerated, foot, right, with necrosis of bone ([place])   . Chronic cough 06/25/2019  . Cutaneous abscess of right foot   . Subacute osteomyelitis of right foot ([place]) 12/14/2018  . AKI (acute kidney injury) ([place]) 12/14/2018  . CKD (chronic kidney disease) stage 4, GFR 15-29 ml/min (HCC) 12/14/2018  . Gait abnormality 08/24/2018  . Diabetic  neuropathy ([place]) 02/06/2018  . Cervical myelopathy ([place]) 02/06/2018  . Onychomycosis 10/30/2017  . Other spondylosis with radiculopathy, cervical region 01/27/2017  . Midfoot ulcer, right, limited to breakdown of skin ([place]) 11/15/2016  . Lateral epicondylitis, left elbow 08/12/2016  . Cellulitis of fifth toe of right foot 07/29/2016  . Prostate cancer ([place]) 06/19/2016  . Non-pressure chronic ulcer of other part of right foot limited to breakdown of skin ([place]) 04/04/2016  . Cellulitis of leg, right 12/24/2015  . Cellulitis of right lower extremity 12/24/2015  . CKD (chronic kidney disease), stage III ([place]) 12/25/2013  . Foot infection 12/24/2013  . Foot ulcer, left ([place]) 12/24/2013  . Diabetic foot ulcer ([place]) 09/14/2012  . Gout 09/14/2012  . HTN (hypertension) 09/14/2012  . Hypothyroidism 09/14/2012  . CAD (coronary artery disease) 09/14/2012  . Diabetes mellitus ([place]) 09/14/2012  . Hypercholesteremia   . Neuropathy [facility])    Past Medical History:  Diagnosis Date  . Ambulates with cane    straight cane  . Cervical myelopathy ([place]) 02/06/2018  . Chronic kidney disease    dailysis M W F- home  . Complication of anesthesia   . Coronary artery disease   . Diabetes mellitus without complication ([place])    type2  . Diabetic foot ulcer ([place])   . Diabetic foot ulcer ([place])   . Diabetic neuropathy ([place]) 02/06/2018  . Gait abnormality 08/24/2018  . GERD (gastroesophageal reflux disease)     01/06/20- not current  . Gout   . History of kidney stones    passed stones  . Hypercholesteremia   . Hypertension   . Hypothyroidism   . Neuromuscular disorder (HCC)    neuropathy left leg and bilateral feet  . Neuropathy   . PONV (postoperative nausea and vomiting)   . Prostate cancer [facility])     Family History  Problem Relation Age of Onset  . Diabetes Mellitus II Mother   . Diabetes Mellitus II Father   . CAD Father   . Cancer Father        prostate  . Diabetes Mellitus II Brother   .  Diabetes Mellitus II Brother   . Stomach cancer Brother 83  . Colon cancer Neg Hx   . Colon polyps Neg Hx   . Esophageal cancer Neg Hx   . Rectal cancer Neg Hx     Past Surgical History:  Procedure Laterality Date  . AMPUTATION Left 12/25/2013   Procedure: AMPUTATION RAY LEFT 5TH RAY;  Surgeon: [name], MD;  Location: [facility];  Service: Orthopedics;  Laterality: Left;  . AMPUTATION Right 12/15/2018   Procedure: AMPUTATION OF 4TH AND 5TH TOES RIGHT FOOT;  Surgeon: [name], MD;  Location: [place];  Service: Orthopedics;  Laterality: Right;  . APPLICATION OF WOUND VAC Right 12/15/2018  Procedure: APPLICATION OF WOUND VAC;  Surgeon: [name], MD;  Location: [place];  Service: Orthopedics;  Laterality: Right;  . AV FISTULA PLACEMENT Left 03/25/2019   Procedure: LEFT ARM ARTERIOVENOUS (AV) FISTULA CREATION;  Surgeon: [name], MD;  Location: [place];  Service: Vascular;  Laterality: Left;  . BACK SURGERY    . BASCILIC VEIN TRANSPOSITION Left 05/20/2019   Procedure: SECOND STAGE LEFT BASCILIC VEIN TRANSPOSITION;  Surgeon: [name], MD;  Location: [place];  Service: Vascular;  Laterality: Left;  . CARDIAC CATHETERIZATION  02/17/2014  . CHOLECYSTECTOMY    . COLONOSCOPY  2011   in [place], [place] GRAFT  2008  . I & D EXTREMITY Right 12/15/2018   Procedure: DEBRIDEMENT RIGHT FOOT;  Surgeon: [name], MD;  Location: [place];  Service: Orthopedics;  Laterality: Right;  . I & D EXTREMITY Right 08/27/2019   Procedure: PARTIAL CUBOID EXCISION RIGHT FOOT;  Surgeon: [name], MD;  Location: [place];  Service: Orthopedics;  Laterality: Right;  . I & D EXTREMITY Right 01/07/2020   Procedure: RIGHT FOOT EXCISION INFECTED BONE;  Surgeon: [name], MD;  Location: [place];  Service: Orthopedics;  Laterality: Right;  . NECK SURGERY     novemver 2019  . WISDOM TOOTH EXTRACTION     Social History   Occupational History  . Not on file  Tobacco Use   . Smoking status: Former Smoker    Years: 25.00    Types: Cigars    Start date: 04/08/1973    Quit date: 12/24/1988    Years since quitting: 31.2  . Smokeless tobacco: Never Used  . Tobacco comment: Cigars and Pipe   Vaping Use  . Vaping Use: Never used  Substance and Sexual Activity  . Alcohol use: No  . Drug use: No  . Sexual activity: Yes    Partners: Female

## 2020-03-20 ENCOUNTER — Ambulatory Visit: Payer: Medicare Other

## 2020-03-20 ENCOUNTER — Other Ambulatory Visit: Payer: Self-pay

## 2020-03-20 DIAGNOSIS — I2510 Atherosclerotic heart disease of native coronary artery without angina pectoris: Secondary | ICD-10-CM

## 2020-03-20 DIAGNOSIS — I5032 Chronic diastolic (congestive) heart failure: Secondary | ICD-10-CM

## 2020-03-20 DIAGNOSIS — I483 Typical atrial flutter: Secondary | ICD-10-CM

## 2020-03-20 NOTE — Progress Notes (Signed)
You are seeing him tomorrow for AFL eval

## 2020-03-20 NOTE — Progress Notes (Signed)
I reviewed his echo: also AFL not AFIB at least by rhythm

## 2020-03-24 ENCOUNTER — Ambulatory Visit: Payer: Medicare Other | Admitting: Cardiology

## 2020-03-24 ENCOUNTER — Encounter: Payer: Self-pay | Admitting: Cardiology

## 2020-03-24 ENCOUNTER — Other Ambulatory Visit: Payer: Self-pay | Admitting: Cardiology

## 2020-03-24 ENCOUNTER — Encounter: Payer: Self-pay | Admitting: Internal Medicine

## 2020-03-24 ENCOUNTER — Ambulatory Visit (INDEPENDENT_AMBULATORY_CARE_PROVIDER_SITE_OTHER): Payer: Medicare Other | Admitting: Internal Medicine

## 2020-03-24 ENCOUNTER — Other Ambulatory Visit: Payer: Self-pay

## 2020-03-24 VITALS — BP 133/64 | HR 56 | Ht 70.0 in | Wt 195.0 lb

## 2020-03-24 DIAGNOSIS — I10 Essential (primary) hypertension: Secondary | ICD-10-CM

## 2020-03-24 DIAGNOSIS — I483 Typical atrial flutter: Secondary | ICD-10-CM | POA: Diagnosis not present

## 2020-03-24 LAB — CBC WITH DIFFERENTIAL/PLATELET
Basophils Absolute: 0 10*3/uL (ref 0.0–0.2)
Basos: 1 %
EOS (ABSOLUTE): 0.1 10*3/uL (ref 0.0–0.4)
Eos: 1 %
Hematocrit: 25.7 % — ABNORMAL LOW (ref 37.5–51.0)
Hemoglobin: 8.3 g/dL — ABNORMAL LOW (ref 13.0–17.7)
Immature Grans (Abs): 0.1 10*3/uL (ref 0.0–0.1)
Immature Granulocytes: 1 %
Lymphocytes Absolute: 0.5 10*3/uL — ABNORMAL LOW (ref 0.7–3.1)
Lymphs: 8 %
MCH: 27.8 pg (ref 26.6–33.0)
MCHC: 32.3 g/dL (ref 31.5–35.7)
MCV: 86 fL (ref 79–97)
Monocytes Absolute: 0.6 10*3/uL (ref 0.1–0.9)
Monocytes: 11 %
Neutrophils Absolute: 4.8 10*3/uL (ref 1.4–7.0)
Neutrophils: 78 %
Platelets: 108 10*3/uL — ABNORMAL LOW (ref 150–450)
RBC: 2.99 x10E6/uL — ABNORMAL LOW (ref 4.14–5.80)
RDW: 15.7 % — ABNORMAL HIGH (ref 11.6–15.4)
WBC: 6 10*3/uL (ref 3.4–10.8)

## 2020-03-24 LAB — BASIC METABOLIC PANEL
BUN/Creatinine Ratio: 6 — ABNORMAL LOW (ref 10–24)
BUN: 27 mg/dL (ref 8–27)
CO2: 31 mmol/L — ABNORMAL HIGH (ref 20–29)
Calcium: 9.2 mg/dL (ref 8.6–10.2)
Chloride: 95 mmol/L — ABNORMAL LOW (ref 96–106)
Creatinine, Ser: 4.2 mg/dL — ABNORMAL HIGH (ref 0.76–1.27)
GFR calc Af Amer: 15 mL/min/{1.73_m2} — ABNORMAL LOW (ref >59)
GFR calc non Af Amer: 13 mL/min/{1.73_m2} — ABNORMAL LOW (ref >59)
Glucose: 134 mg/dL — ABNORMAL HIGH (ref 65–99)
Potassium: 3.9 mmol/L (ref 3.5–5.2)
Sodium: 138 mmol/L (ref 134–144)

## 2020-03-24 NOTE — H&P (View-Only) (Signed)
HPI [name] is referred today by Dr. Einar Gip for evaluation of atrial flutter. He is a pleasant 69 yo man with CAD, s/p CABG almost 20 years ago, HTN, and ESRD on HD. He was placed on xarelto by Dr. Einar Gip for stroke prevention in atrial flutter. He feels very poorly in atrial flutter even though his HR is usually well controlled. A 2D echo with him out of rhythm demonstrated mild LV dysfunction which is new. Allergies  Allergen Reactions   Mushroom Extract Complex Nausea Only     Current Outpatient Medications  Medication Sig Dispense Refill   acetaminophen (TYLENOL) 500 MG tablet Take 1,500 mg by mouth every 8 (eight) hours as needed for moderate pain.     allopurinol (ZYLOPRIM) 100 MG tablet Take 200 mg by mouth daily.      B Complex-C-Zn-Folic Acid (DIALYVITE 371 WITH ZINC) 0.8 MG TABS Take 1 tablet by mouth daily.     benzonatate (TESSALON) 200 MG capsule Take 1 capsule (200 mg total) by mouth 3 (three) times daily as needed for cough. 90 capsule 2   calcitRIOL (ROCALTROL) 0.5 MCG capsule Take 0.5 mcg by mouth daily.      carvedilol (COREG) 6.25 MG tablet Take 12.5 mg by mouth 2 (two) times daily.     chlorhexidine (PERIDEX) 0.12 % solution 15 mLs by Mouth Rinse route daily.     diclofenac Sodium (VOLTAREN) 1 % GEL Apply 1 application topically daily as needed (pain).      famotidine (PEPCID) 20 MG tablet Take 1 tablet (20 mg total) by mouth at bedtime. 30 tablet 3   HYDROcodone-acetaminophen (NORCO/VICODIN) 5-325 MG tablet Take 1 tablet by mouth every 6 (six) hours as needed for moderate pain.     Lancets (ONETOUCH DELICA PLUS [id number]) MISC      levothyroxine (SYNTHROID, LEVOTHROID) 112 MCG tablet Take 112 mcg by mouth daily before breakfast. BRAND NAME SYNTHROID     Methoxy PEG-Epoetin Beta (MIRCERA IJ) Inject into the skin.     omeprazole (PRILOSEC) 40 MG capsule Take 1 capsule (40 mg total) by mouth daily. 30 capsule 3   oxyCODONE-acetaminophen  (PERCOCET/ROXICET) 5-325 MG tablet Take 1 tablet by mouth every 4 (four) hours as needed. (Patient taking differently: Take 1 tablet by mouth every 4 (four) hours as needed for moderate pain.) 30 tablet 0   pregabalin (LYRICA) 150 MG capsule Take 150 mg by mouth in the morning and at bedtime.      Rivaroxaban (XARELTO) 15 MG TABS tablet Take 1 tablet (15 mg total) by mouth daily with supper. 30 tablet 2   rosuvastatin (CRESTOR) 10 MG tablet Take 10 mg by mouth daily.      Semaglutide, 1 MG/DOSE, (OZEMPIC, 1 MG/DOSE,) 2 MG/1.5ML SOPN Inject 0.5 mg into the skin every Friday. Takes in the evening     sevelamer carbonate (RENVELA) 800 MG tablet Take 2,400 mg by mouth 3 (three) times daily.     No current facility-administered medications for this visit.     Past Medical History:  Diagnosis Date   Ambulates with cane    straight cane   Cervical myelopathy ([place]) 02/06/2018   Chronic kidney disease    dailysis M W F- home   Complication of anesthesia    Coronary artery disease    Diabetes mellitus without complication ([place])    type2   Diabetic foot ulcer ([place])    Diabetic foot ulcer ([place])    Diabetic neuropathy ([place]) 02/06/2018  Gait abnormality 08/24/2018   GERD (gastroesophageal reflux disease)     01/06/20- not current   Gout    History of kidney stones    passed stones   Hypercholesteremia    Hypertension    Hypothyroidism    Neuromuscular disorder (HCC)    neuropathy left leg and bilateral feet   Neuropathy    PONV (postoperative nausea and vomiting)    Prostate cancer ([place])     ROS:   All systems reviewed and negative except as noted in the HPI.   Past Surgical History:  Procedure Laterality Date   AMPUTATION Left 12/25/2013   Procedure: AMPUTATION RAY LEFT 5TH RAY;  Surgeon: [name], MD;  Location: [facility];  Service: Orthopedics;  Laterality: Left;   AMPUTATION Right 12/15/2018   Procedure: AMPUTATION OF 4TH AND 5TH TOES RIGHT FOOT;   Surgeon: [name], MD;  Location: [facility];  Service: Orthopedics;  Laterality: Right;   APPLICATION OF WOUND VAC Right 12/15/2018   Procedure: APPLICATION OF WOUND VAC;  Surgeon: [name], MD;  Location: [facility];  Service: Orthopedics;  Laterality: Right;   AV FISTULA PLACEMENT Left 03/25/2019   Procedure: LEFT ARM ARTERIOVENOUS (AV) FISTULA CREATION;  Surgeon: [name], MD;  Location: [facility];  Service: Vascular;  Laterality: Left;   [place] Left 05/20/2019   Procedure: SECOND STAGE LEFT BASCILIC VEIN TRANSPOSITION;  Surgeon: [name], MD;  Location: [facility];  Service: Vascular;  Laterality: Left;   CARDIAC CATHETERIZATION  02/17/2014   CHOLECYSTECTOMY     COLONOSCOPY  2011   in [place], [place]  2008   I & D EXTREMITY Right 12/15/2018   Procedure: DEBRIDEMENT RIGHT FOOT;  Surgeon: [name], MD;  Location: [facility];  Service: Orthopedics;  Laterality: Right;   I & D EXTREMITY Right 08/27/2019   Procedure: PARTIAL CUBOID EXCISION RIGHT FOOT;  Surgeon: [name], MD;  Location: [facility];  Service: Orthopedics;  Laterality: Right;   I & D EXTREMITY Right 01/07/2020   Procedure: RIGHT FOOT EXCISION INFECTED BONE;  Surgeon: [name], MD;  Location: [facility];  Service: Orthopedics;  Laterality: Right;   NECK SURGERY     novemver 2019   WISDOM TOOTH EXTRACTION       Family History  Problem Relation Age of Onset   Diabetes Mellitus II Mother    Diabetes Mellitus II Father    CAD Father    Cancer Father        prostate   Diabetes Mellitus II Brother    Diabetes Mellitus II Brother    Stomach cancer Brother 57   Colon cancer Neg Hx    Colon polyps Neg Hx    Esophageal cancer Neg Hx    Rectal cancer Neg Hx      Social History   Socioeconomic History   Marital status: Married    Spouse name: Not on file   Number of children: 2   Years of education: Not on file    Highest education level: Not on file  Occupational History   Not on file  Tobacco Use   Smoking status: Former Smoker    Years: 25.00    Types: Cigars    Start date: 04/08/1973    Quit date: 12/24/1988    Years since quitting: 31.2   Smokeless tobacco: Never Used   Tobacco comment: Cigars and  Pipe   Vaping Use   Vaping Use: Never used  Substance and Sexual Activity   Alcohol use: No   Drug use: No   Sexual activity: Yes    Partners: Female  Other Topics Concern   Not on file  Social History Narrative   Regular exercise: yes 3 times a week   Caffeine use: hot tea   Social Determinants of Health   Financial Resource Strain: Not on file  Food Insecurity: Not on file  Transportation Needs: Not on file  Physical Activity: Not on file  Stress: Not on file  Social Connections: Not on file  Intimate Partner Violence: Not on file     BP 134/62    Pulse (!) 55    Ht 5\' 10"  (1.778 m)    Wt 190 lb 12.8 oz (86.5 kg)    SpO2 90%    BMI 27.38 kg/m   Physical Exam:  Well appearing NAD HEENT: Unremarkable Neck:  No JVD, no thyromegally Lymphatics:  No adenopathy Back:  No CVA tenderness Lungs:  Clear with no wheezes HEART:  Regular rate rhythm, no murmurs, no rubs, no clicks Abd:  soft, positive bowel sounds, no organomegally, no rebound, no guarding Ext:  2 plus pulses, no edema, no cyanosis, no clubbing Skin:  No rashes no nodules Neuro:  CN II through XII intact, motor grossly intact  EKG - atrial flutter with a CVR   Assess/Plan: 1. Atrial flutter - his rate is controlled but he states that he has felt poorly the past month. I have discussed the treatment options with the patient and his wife and he would like to proceed with catheter ablation. 2. CAD - he is s/p CABG and denies chest pain. 3. LV dysfunction - he has class 2 CHF symptoms. Unclear if this is due to atrial flutter or progression of CAD.  4. Coags - he would like to get off of his [place]. We will  stop 3 weeks after ablation.  Carleene Overlie Josselyne Onofrio,MD

## 2020-03-24 NOTE — Patient Instructions (Addendum)
Medication Instructions:  Your physician recommends that you continue on your current medications as directed. Please refer to the Current Medication list given to you today.  Labwork: You will get lab work today:  BMP and CBC  Testing/Procedures: None ordered.  Follow-Up:  SEE INSTRUCTION LETTER  Any Other Special Instructions Will Be Listed Below (If Applicable).  If you need a refill on your cardiac medications before your next appointment, please call your pharmacy.    Cardiac Ablation Cardiac ablation is a procedure to disable (ablate) a small amount of heart tissue in very specific places. The heart has many electrical connections. Sometimes these connections are abnormal and can cause the heart to beat very fast or irregularly. Ablating some of the problem areas can improve the heart rhythm or return it to normal. Ablation may be done for people who:  Have Wolff-Parkinson-White syndrome.  Have fast heart rhythms (tachycardia).  Have taken medicines for an abnormal heart rhythm (arrhythmia) that were not effective or caused side effects.  Have a high-risk heartbeat that may be life-threatening. During the procedure, a small incision is made in the neck or the groin, and a long, thin, flexible tube (catheter) is inserted into the incision and moved to the heart. Small devices (electrodes) on the tip of the catheter will send out electrical currents. A type of X-ray (fluoroscopy) will be used to help guide the catheter and to provide images of the heart. Tell a health care provider about:  Any allergies you have.  All medicines you are taking, including vitamins, herbs, eye drops, creams, and over-the-counter medicines.  Any problems you or family members have had with anesthetic medicines.  Any blood disorders you have.  Any surgeries you have had.  Any medical conditions you have, such as kidney failure.  Whether you are pregnant or may be pregnant. What are the  risks? Generally, this is a safe procedure. However, problems may occur, including:  Infection.  Bruising and bleeding at the catheter insertion site.  Bleeding into the chest, especially into the sac that surrounds the heart. This is a serious complication.  Stroke or blood clots.  Damage to other structures or organs.  Allergic reaction to medicines or dyes.  Need for a permanent pacemaker if the normal electrical system is damaged. A pacemaker is a small computer that sends electrical signals to the heart and helps your heart beat normally.  The procedure not being fully effective. This may not be recognized until months later. Repeat ablation procedures are sometimes required. What happens before the procedure?  Follow instructions from your health care provider about eating or drinking restrictions.  Ask your health care provider about: ? Changing or stopping your regular medicines. This is especially important if you are taking diabetes medicines or blood thinners. ? Taking medicines such as aspirin and ibuprofen. These medicines can thin your blood. Do not take these medicines before your procedure if your health care provider instructs you not to.  Plan to have someone take you home from the hospital or clinic.  If you will be going home right after the procedure, plan to have someone with you for 24 hours. What happens during the procedure?  To lower your risk of infection: ? Your health care team will wash or sanitize their hands. ? Your skin will be washed with soap. ? Hair may be removed from the incision area.  An IV tube will be inserted into one of your veins.  You will be given a   medicine to help you relax (sedative).  The skin on your neck or groin will be numbed.  An incision will be made in your neck or your groin.  A needle will be inserted through the incision and into a large vein in your neck or groin.  A catheter will be inserted into the needle  and moved to your heart.  Dye may be injected through the catheter to help your surgeon see the area of the heart that needs treatment.  Electrical currents will be sent from the catheter to ablate heart tissue in desired areas. There are three types of energy that may be used to ablate heart tissue: ? Heat (radiofrequency energy). ? Laser energy. ? Extreme cold (cryoablation).  When the necessary tissue has been ablated, the catheter will be removed.  Pressure will be held on the catheter insertion area to prevent excessive bleeding.  A bandage (dressing) will be placed over the catheter insertion area. The procedure may vary among health care providers and hospitals. What happens after the procedure?  Your blood pressure, heart rate, breathing rate, and blood oxygen level will be monitored until the medicines you were given have worn off.  Your catheter insertion area will be monitored for bleeding. You will need to lie still for a few hours to ensure that you do not bleed from the catheter insertion area.  Do not drive for 24 hours or as long as directed by your health care provider. Summary  Cardiac ablation is a procedure to disable (ablate) a small amount of heart tissue in very specific places. Ablating some of the problem areas can improve the heart rhythm or return it to normal.  During the procedure, electrical currents will be sent from the catheter to ablate heart tissue in desired areas. This information is not intended to replace advice given to you by your health care provider. Make sure you discuss any questions you have with your health care provider. Document Revised: 09/15/2017 Document Reviewed: 02/12/2016 Elsevier Patient Education  2020 Elsevier Inc.   

## 2020-03-24 NOTE — Progress Notes (Signed)
HPI [name] is referred today by Dr. Einar Gip for evaluation of atrial flutter. He is a pleasant 69 yo man with CAD, s/p CABG almost 20 years ago, HTN, and ESRD on HD. He was placed on xarelto by Dr. Einar Gip for stroke prevention in atrial flutter. He feels very poorly in atrial flutter even though his HR is usually well controlled. A 2D echo with him out of rhythm demonstrated mild LV dysfunction which is new. Allergies  Allergen Reactions  . Mushroom Extract Complex Nausea Only     Current Outpatient Medications  Medication Sig Dispense Refill  . acetaminophen (TYLENOL) 500 MG tablet Take 1,500 mg by mouth every 8 (eight) hours as needed for moderate pain.    Marland Kitchen allopurinol (ZYLOPRIM) 100 MG tablet Take 200 mg by mouth daily.     . B Complex-C-Zn-Folic Acid (DIALYVITE 734 WITH ZINC) 0.8 MG TABS Take 1 tablet by mouth daily.    . benzonatate (TESSALON) 200 MG capsule Take 1 capsule (200 mg total) by mouth 3 (three) times daily as needed for cough. 90 capsule 2  . calcitRIOL (ROCALTROL) 0.5 MCG capsule Take 0.5 mcg by mouth daily.     . carvedilol (COREG) 6.25 MG tablet Take 12.5 mg by mouth 2 (two) times daily.    . chlorhexidine (PERIDEX) 0.12 % solution 15 mLs by Mouth Rinse route daily.    . diclofenac Sodium (VOLTAREN) 1 % GEL Apply 1 application topically daily as needed (pain).     . famotidine (PEPCID) 20 MG tablet Take 1 tablet (20 mg total) by mouth at bedtime. 30 tablet 3  . HYDROcodone-acetaminophen (NORCO/VICODIN) 5-325 MG tablet Take 1 tablet by mouth every 6 (six) hours as needed for moderate pain.    . Lancets (ONETOUCH DELICA PLUS [id number]) MISC     . levothyroxine (SYNTHROID, LEVOTHROID) 112 MCG tablet Take 112 mcg by mouth daily before breakfast. BRAND NAME SYNTHROID    . Methoxy PEG-Epoetin Beta (MIRCERA IJ) Inject into the skin.    Marland Kitchen omeprazole (PRILOSEC) 40 MG capsule Take 1 capsule (40 mg total) by mouth daily. 30 capsule 3  . oxyCODONE-acetaminophen  (PERCOCET/ROXICET) 5-325 MG tablet Take 1 tablet by mouth every 4 (four) hours as needed. (Patient taking differently: Take 1 tablet by mouth every 4 (four) hours as needed for moderate pain.) 30 tablet 0  . pregabalin (LYRICA) 150 MG capsule Take 150 mg by mouth in the morning and at bedtime.     . Rivaroxaban (XARELTO) 15 MG TABS tablet Take 1 tablet (15 mg total) by mouth daily with supper. 30 tablet 2  . rosuvastatin (CRESTOR) 10 MG tablet Take 10 mg by mouth daily.     . Semaglutide, 1 MG/DOSE, (OZEMPIC, 1 MG/DOSE,) 2 MG/1.5ML SOPN Inject 0.5 mg into the skin every Friday. Takes in the evening    . sevelamer carbonate (RENVELA) 800 MG tablet Take 2,400 mg by mouth 3 (three) times daily.     No current facility-administered medications for this visit.     Past Medical History:  Diagnosis Date  . Ambulates with cane    straight cane  . Cervical myelopathy ([place]) 02/06/2018  . Chronic kidney disease    dailysis M W F- home  . Complication of anesthesia   . Coronary artery disease   . Diabetes mellitus without complication ([place])    type2  . Diabetic foot ulcer ([place])   . Diabetic foot ulcer ([place])   . Diabetic neuropathy ([place]) 02/06/2018  .  Gait abnormality 08/24/2018  . GERD (gastroesophageal reflux disease)     01/06/20- not current  . Gout   . History of kidney stones    passed stones  . Hypercholesteremia   . Hypertension   . Hypothyroidism   . Neuromuscular disorder (HCC)    neuropathy left leg and bilateral feet  . Neuropathy   . PONV (postoperative nausea and vomiting)   . Prostate cancer ([place])     ROS:   All systems reviewed and negative except as noted in the HPI.   Past Surgical History:  Procedure Laterality Date  . AMPUTATION Left 12/25/2013   Procedure: AMPUTATION RAY LEFT 5TH RAY;  Surgeon: [name], MD;  [facility];  Service: Orthopedics;  Laterality: Left;  . AMPUTATION Right 12/15/2018   Procedure: AMPUTATION OF 4TH AND 5TH TOES RIGHT FOOT;   Surgeon: [name], MD;  Location: [facility];  Service: Orthopedics;  Laterality: Right;  . APPLICATION OF WOUND VAC Right 12/15/2018   Procedure: APPLICATION OF WOUND VAC;  Surgeon: [name], MD;  Location: [facility];  Service: Orthopedics;  Laterality: Right;  . AV FISTULA PLACEMENT Left 03/25/2019   Procedure: LEFT ARM ARTERIOVENOUS (AV) FISTULA CREATION;  Surgeon: [name], MD;  Location: [facility];  Service: Vascular;  Laterality: Left;  . BACK SURGERY    . BASCILIC VEIN TRANSPOSITION Left 05/20/2019   Procedure: SECOND STAGE LEFT BASCILIC VEIN TRANSPOSITION;  Surgeon: [name], MD;  Location: [facility];  Service: Vascular;  Laterality: Left;  . CARDIAC CATHETERIZATION  02/17/2014  . CHOLECYSTECTOMY    . COLONOSCOPY  2011   in [place], [place]  2008  . I & D EXTREMITY Right 12/15/2018   Procedure: DEBRIDEMENT RIGHT FOOT;  Surgeon: [name], MD;  Location: [facility];  Service: Orthopedics;  Laterality: Right;  . I & D EXTREMITY Right 08/27/2019   Procedure: PARTIAL CUBOID EXCISION RIGHT FOOT;  Surgeon: [name], MD;  Location: [facility];  Service: Orthopedics;  Laterality: Right;  . I & D EXTREMITY Right 01/07/2020   Procedure: RIGHT FOOT EXCISION INFECTED BONE;  Surgeon: [name], MD;  Location: [facility];  Service: Orthopedics;  Laterality: Right;  . NECK SURGERY     novemver 2019  . WISDOM TOOTH EXTRACTION       Family History  Problem Relation Age of Onset  . Diabetes Mellitus II Mother   . Diabetes Mellitus II Father   . CAD Father   . Cancer Father        prostate  . Diabetes Mellitus II Brother   . Diabetes Mellitus II Brother   . Stomach cancer Brother 87  . Colon cancer Neg Hx   . Colon polyps Neg Hx   . Esophageal cancer Neg Hx   . Rectal cancer Neg Hx      Social History   Socioeconomic History  . Marital status: Married    Spouse name: Not on file  . Number of children: 2  . Years of education: Not on file   . Highest education level: Not on file  Occupational History  . Not on file  Tobacco Use  . Smoking status: Former Smoker    Years: 25.00    Types: Cigars    Start date: 04/08/1973    Quit date: 12/24/1988    Years since quitting: 31.2  . Smokeless tobacco: Never Used  . Tobacco comment: Cigars and  Pipe   Vaping Use  . Vaping Use: Never used  Substance and Sexual Activity  . Alcohol use: No  . Drug use: No  . Sexual activity: Yes    Partners: Female  Other Topics Concern  . Not on file  Social History Narrative   Regular exercise: yes 3 times a week   Caffeine use: hot tea   Social Determinants of Health   Financial Resource Strain: Not on file  Food Insecurity: Not on file  Transportation Needs: Not on file  Physical Activity: Not on file  Stress: Not on file  Social Connections: Not on file  Intimate Partner Violence: Not on file     BP 134/62   Pulse (!) 55   Ht 5\' 10"  (1.778 m)   Wt 190 lb 12.8 oz (86.5 kg)   SpO2 90%   BMI 27.38 kg/m   Physical Exam:  Well appearing NAD HEENT: Unremarkable Neck:  No JVD, no thyromegally Lymphatics:  No adenopathy Back:  No CVA tenderness Lungs:  Clear with no wheezes HEART:  Regular rate rhythm, no murmurs, no rubs, no clicks Abd:  soft, positive bowel sounds, no organomegally, no rebound, no guarding Ext:  2 plus pulses, no edema, no cyanosis, no clubbing Skin:  No rashes no nodules Neuro:  CN II through XII intact, motor grossly intact  EKG - atrial flutter with a CVR   Assess/Plan: 1. Atrial flutter - his rate is controlled but he states that he has felt poorly the past month. I have discussed the treatment options with the patient and his wife and he would like to proceed with catheter ablation. 2. CAD - he is s/p CABG and denies chest pain. 3. LV dysfunction - he has class 2 CHF symptoms. Unclear if this is due to atrial flutter or progression of CAD.  4. Coags - he would like to get off of his [place]. We will  stop 3 weeks after ablation.  Carleene Overlie Jheri Mitter,MD

## 2020-03-24 NOTE — Progress Notes (Signed)
Primary Physician/Referring:  Lorenda Hatchet, FNP  Patient ID: Francis Dowse, male    DOB: [birth year], 69 y.o.   MRN: [id number]  Chief Complaint  Patient presents with   Coronary Artery Disease   Results   Follow-up   HPI:    Marbin Olshefski  is a 69 y.o.  Caucasian male with CABG in [place] 02/18/2004: RIMA to LAD, LIMA to obtuse marginal, SVG to diagonal and SVG to PDA, who has diabetes mellitus, diabetic nephropathy with ESRD on HD, peripheral neuropathy and diabetic foot ulcer right, S/P Sim's amputation of the right foot and now has chronic ulcer, amputation of his left 2 toes due to diabetic foot ulcer in the past.  He still has a dry wound in his right leg but is healing well.  I had seen him 2 weeks ago with worsening dyspnea, fatigue.  He was found to be in new onset atrial flutter on 03/08/2021.  Continues to have marked fatigue and dyspnea.  He underwent stress test and echocardiogram and is accompanied by his wife and presents for follow-up.  He is tolerating Eliquis but states that it is hard for them to stop bleeding after dialysis access.  There is no PND or orthopnea, there is no leg edema, denies any chest pain or palpitations.  Past Medical History:  Diagnosis Date   Ambulates with cane    straight cane   Cervical myelopathy ([place]) 02/06/2018   Chronic kidney disease    dailysis M [facility] F- home   Complication of anesthesia    Coronary artery disease    Diabetes mellitus without complication ([place])    type2   Diabetic foot ulcer ([place])    Diabetic foot ulcer ([place])    Diabetic neuropathy ([place]) 02/06/2018   Gait abnormality 08/24/2018   GERD (gastroesophageal reflux disease)     01/06/20- not current   Gout    History of kidney stones    passed stones   Hypercholesteremia    Hypertension    Hypothyroidism    Neuromuscular disorder ([place])    neuropathy left leg and bilateral feet   Neuropathy    PONV (postoperative nausea and vomiting)    Prostate  cancer [facility])    Past Surgical History:  Procedure Laterality Date   AMPUTATION Left 12/25/2013   Procedure: AMPUTATION RAY LEFT 5TH RAY;  Surgeon: [name], MD;  Location: [facility];  Service: Orthopedics;  Laterality: Left;   AMPUTATION Right 12/15/2018   Procedure: AMPUTATION OF 4TH AND 5TH TOES RIGHT FOOT;  Surgeon: [name], MD;  Location: [place];  Service: Orthopedics;  Laterality: Right;   APPLICATION OF WOUND VAC Right 12/15/2018   Procedure: APPLICATION OF WOUND VAC;  Surgeon: [name], MD;  Location: [place];  Service: Orthopedics;  Laterality: Right;   AV FISTULA PLACEMENT Left 03/25/2019   Procedure: LEFT ARM ARTERIOVENOUS (AV) FISTULA CREATION;  Surgeon: [name], MD;  Location: [place] [facility];  Service: Vascular;  Laterality: Left;   [place] Left 05/20/2019   Procedure: SECOND STAGE LEFT BASCILIC VEIN TRANSPOSITION;  Surgeon: [name], MD;  Location: [place] [facility];  Service: Vascular;  Laterality: Left;   CARDIAC CATHETERIZATION  02/17/2014   CHOLECYSTECTOMY     COLONOSCOPY  2011   in [place], [place]  2008   I & D EXTREMITY Right 12/15/2018   Procedure: DEBRIDEMENT RIGHT FOOT;  Surgeon: [name],  MD;  Location: [place];  Service: Orthopedics;  Laterality: Right;   I & D EXTREMITY Right 08/27/2019   Procedure: PARTIAL CUBOID EXCISION RIGHT FOOT;  Surgeon: [name], MD;  Location: [place];  Service: Orthopedics;  Laterality: Right;   I & D EXTREMITY Right 01/07/2020   Procedure: RIGHT FOOT EXCISION INFECTED BONE;  Surgeon: [name], MD;  Location: [place];  Service: Orthopedics;  Laterality: Right;   NECK SURGERY     novemver 2019   WISDOM TOOTH EXTRACTION     Social History   Tobacco Use   Smoking status: Former Smoker    Years: 25.00    Types: Cigars    Start date: 04/08/1973    Quit date: 12/24/1988    Years since quitting: 31.2   Smokeless tobacco: Never Used    Tobacco comment: Cigars and Pipe   Substance Use Topics   Alcohol use: No    ROS  Review of Systems  Constitutional: Positive for malaise/fatigue and weight loss. Negative for weight gain.  Cardiovascular: Negative for dyspnea on exertion, leg swelling and syncope.  Respiratory: Negative for hemoptysis.   Endocrine: Negative for cold intolerance.  Hematologic/Lymphatic: Does not bruise/bleed easily.  Skin:       Right foot ulcer (not examined) and right shin skin tear after a recent trauma.  Gastrointestinal: Negative for hematochezia and melena.  Neurological: Positive for light-headedness and paresthesias. Negative for headaches.   Objective  Blood pressure 133/64, pulse (!) 56, height 5\' 10"  (1.778 m), weight 195 lb (88.5 kg), SpO2 99 %.  Vitals with BMI 03/24/2020 03/16/2020 03/08/2020  Height 5\' 10"  5\' 10"  5\' 10"   Weight 195 lbs 199 lbs 204 lbs  BMI 27.98 66.29 47.65  Systolic 465 035 465  Diastolic 64 64 77  Pulse 56 67 70     Physical Exam Constitutional:      Comments: Ill looking  HENT:     Head: Atraumatic.  Eyes:     Conjunctiva/sclera: Conjunctivae normal.  Neck:     Thyroid: No thyromegaly.     Vascular: No JVD.  Cardiovascular:     Rate and Rhythm: Normal rate and regular rhythm.     Pulses:          Carotid pulses are 2+ on the right side and 2+ on the left side.      Femoral pulses are 2+ on the right side and 2+ on the left side.      Popliteal pulses are 2+ on the right side and 2+ on the left side.       Dorsalis pedis pulses are 1+ on the right side and 1+ on the left side.       Posterior tibial pulses are 1+ on the right side and 1+ on the left side.     Heart sounds: Murmur heard.   Holosystolic murmur is present with a grade of 2/6. Through out the precordium related to AV shunt left arm.   No gallop.      Comments: Has ulcers on right foot, covered by tape and per wife and patient has no discharge and is almost healed. Thick rigid nails and  onychomycosis (chronic).  No leg edema. Capillary refill < 3 Sec.  Left arm AV fistula for dialysis patent. Pulmonary:     Effort: Pulmonary effort is normal.     Breath sounds: Normal breath sounds.  Abdominal:     General: Bowel sounds are normal.  Palpations: Abdomen is soft.  Musculoskeletal:        General: Normal range of motion.     Cervical back: Neck supple.  Skin:    General: Skin is warm and dry.  Neurological:     Mental Status: He is alert.    Laboratory examination:   Recent Labs    08/27/19 0728 01/07/20 1048 02/28/20 1522  NA 136 141 141  K 3.7 4.2 4.1  CL 96* 100 100  CO2  --   --  30  GLUCOSE 144* 93 121*  BUN 44* 33* 36*  CREATININE 8.80* 5.70* 5.97*  CALCIUM  --   --  9.1   CrCl cannot be calculated (Patient's most recent lab result is older than the maximum 21 days allowed.).  CMP Latest Ref Rng & Units 02/28/2020 01/07/2020 08/27/2019  Glucose 65 - 99 mg/dL 121(H) 93 144(H)  BUN 7 - 25 mg/dL 36(H) 33(H) 44(H)  Creatinine 0.70 - 1.18 mg/dL 5.97(H) 5.70(H) 8.80(H)  Sodium 135 - 146 mmol/L 141 141 136  Potassium 3.5 - 5.3 mmol/L 4.1 4.2 3.7  Chloride 98 - 110 mmol/L 100 100 96(L)  CO2 20 - 32 mmol/L 30 - -  Calcium 8.6 - 10.3 mg/dL 9.1 - -  Total Protein 6.5 - 8.1 g/dL - - -  Total Bilirubin 0.3 - 1.2 mg/dL - - -  Alkaline Phos 38 - 126 U/L - - -  AST 15 - 41 U/L - - -  ALT 0 - 44 U/L - - -   CBC Latest Ref Rng & Units 02/28/2020 01/07/2020 12/27/2019  WBC 3.8 - 10.8 Thousand/uL 4.5 - 6.0  Hemoglobin 13.2 - 17.1 g/dL 10.1(L) 10.5(L) 10.8(L)  Hematocrit 38.5 - 50.0 % 31.3(L) 31.0(L) 33.9(L)  Platelets 140 - 400 Thousand/uL 89(L) - 180   HEMOGLOBIN A1C Lab Results  Component Value Date   HGBA1C 6.3 (H) 12/14/2018   MPG 134.11 12/14/2018   External labs:   Labs 08/25/2019:  Total cholesterol 109, triglycerides 331, HDL 25, LDL 43.  Non-HDL cholesterol 84.  Labs 02/25/2020: A1c 4.7%.  TSH normal.  Labs 02/17/2020:  Hb 10.4/HCT  31.5, platelets 146, normal indicis.  Sodium 139, potassium 4.5, BUN 46, creatinine 5.88.  Total bilirubin 0.69.  Albumin 4.2.  ALT 14.  AST 19.  Medications and allergies   Allergies  Allergen Reactions   Mushroom Extract Complex Nausea Only    Current Outpatient Medications on File Prior to Visit  Medication Sig Dispense Refill   acetaminophen (TYLENOL) 500 MG tablet Take 1,500 mg by mouth every 8 (eight) hours as needed for moderate pain.     allopurinol (ZYLOPRIM) 100 MG tablet Take 200 mg by mouth daily.      B Complex-C-Zn-Folic Acid (DIALYVITE 453 WITH ZINC) 0.8 MG TABS Take 1 tablet by mouth daily.     benzonatate (TESSALON) 200 MG capsule Take 1 capsule (200 mg total) by mouth 3 (three) times daily as needed for cough. 90 capsule 2   calcitRIOL (ROCALTROL) 0.5 MCG capsule Take 0.5 mcg by mouth daily.      carvedilol (COREG) 6.25 MG tablet Take 12.5 mg by mouth 2 (two) times daily.     chlorhexidine (PERIDEX) 0.12 % solution 15 mLs by Mouth Rinse route daily.     diclofenac Sodium (VOLTAREN) 1 % GEL Apply 1 application topically daily as needed (pain).      famotidine (PEPCID) 20 MG tablet Take 1 tablet (20 mg total) by mouth at bedtime. [place]  tablet 3   HYDROcodone-acetaminophen (NORCO/VICODIN) 5-325 MG tablet Take 1 tablet by mouth every 6 (six) hours as needed for moderate pain.     Lancets (ONETOUCH DELICA PLUS [id number]) MISC      levothyroxine (SYNTHROID, LEVOTHROID) 112 MCG tablet Take 112 mcg by mouth daily before breakfast. BRAND NAME SYNTHROID     Methoxy PEG-Epoetin Beta (MIRCERA IJ) Inject into the skin.     omeprazole (PRILOSEC) 40 MG capsule Take 1 capsule (40 mg total) by mouth daily. 30 capsule 3   oxyCODONE-acetaminophen (PERCOCET/ROXICET) 5-325 MG tablet Take 1 tablet by mouth every 4 (four) hours as needed. (Patient taking differently: Take 1 tablet by mouth every 4 (four) hours as needed for moderate pain.) 30 tablet 0   pregabalin (LYRICA) 150 MG  capsule Take 150 mg by mouth in the morning and at bedtime.      Rivaroxaban (XARELTO) 15 MG TABS tablet Take 1 tablet (15 mg total) by mouth daily with supper. 30 tablet 2   rosuvastatin (CRESTOR) 10 MG tablet Take 10 mg by mouth daily.      Semaglutide, 1 MG/DOSE, (OZEMPIC, 1 MG/DOSE,) 2 MG/1.5ML SOPN Inject 0.5 mg into the skin every Friday. Takes in the evening     sevelamer carbonate (RENVELA) 800 MG tablet Take 2,400 mg by mouth 3 (three) times daily.     No current facility-administered medications on file prior to visit.     Radiology:   CXR 02/25/2020: Persistent small bilateral pleural effusions, slightly increased on the left.  Cardiac Studies:   Lower extremity arterial duplex 05/12/2014:  No hemodynamically significant stenoses are identified in the bilateral lower extremity arterial system.This exam reveals normal perfusion of both the lower extremities with RABI 1.04 and LABI 1.09. There is mild diffuse disease involving the small vessels below the knee. Compared to the study done on 06/08/2013, no significant change. Impression: Lower extremity venous insufficiency study 12/25/2016: No venous insufficiency or DVT in the right lower extremity. Reflexes in the left greater saphenous vein beginning in the distal thigh and involving the entire length of the calf. No obvious varicosities.   ABI 12/16/2018: Right: Resting right ankle-brachial index is within normal range. No evidence of significant right lower extremity arterial disease. Left: Resting left ankle-brachial index indicates noncompressible left lower extremity arteries.  Carotid artery duplex  05/07/2019: Stenosis in the bilateral internal carotid artery (16-49%), lower end of spectrum with heteregenous plaque.  Antegrade right vertebral artery flow. Antegrade left vertebral artery flow. Follow up in one year is appropriate if clinically indicated. Compared to 01/27/2018, no significant change.  Echocardiogram  03/17/2020: Mildly depressed LV systolic function with visual EF 40-45%. Left ventricle cavity is normal in size. Moderate concentric hypertrophy of the left ventricle. Left ventricle regional wall motion findings: Mid inferoseptal, Apical septal and Apical cap hypokinesis. Unable to evaluate diastolic function due to atrial fibrillation. Elevated LAP.  Left atrial cavity is severely dilated. Aortic valve sclerosis without stenosis. Mild (Grade I) mitral regurgitation. Mild tricuspid regurgitation. No evidence of pulmonary hypertension. RVSP measures 33 mmHg. Mild pulmonic regurgitation. IVC is dilated with blunted respiratory response. Compared to prior study dated 06/08/2013: LVEF was 60-65% and now 40-45%, RWMA is new, LA is now severely dilated.   Lexiscan Tetrofosmin Stress Test 03/20/2020: Non-diagnostic ECG stress due to pharmacologic stress testing. Resting EKG demonstrated atrial flutter. Non-specific Twave abnormalities. Peak EKG revealed no significant ST-T change from baseline abnormality. Myocardial perfusion is normal. Mildly enlarged left ventricle. in both rest and stress.  LV stress volume 170 ml. TID is normal. No stress lung uptake. Overall LV systolic function is low normal without regional wall motion  abnormalities. Stress LV EF: 51%. Compared to 05/28/2013, no significant change. LV dilatation is new.    EKG   EKG 03/24/2020: Atrial flutter with 4: 1 conduction, ventricular rate 66 bpm.  Normal axis.  Nonspecific T abnormality.   EKG 03/08/2020: Typical atrial flutter with 3: 1 conduction with ventricular rate of 67 bpm, normal axis.  EKG 05/03/2019: Sinus rhythm with borderline first-degree AV block, normal axis, incomplete right bundle branch block.  Poor R progression, probably normal variant.  No evidence of ischemia.No significant change from EKG 05/18/2018   Assessment   No diagnosis found. CHA2DS2-VASc Score is 4.  Yearly risk of stroke: 4.8% (A, HTN, DM, Vasc  Dz).  Score of 1=0.6; 2=2.2; 3=3.2; 4=4.8; 5=7.2; 6=9.8; 7=>9.8) -(CHF; HTN; vasc disease DM,  Male = 1; Age <65 =0; 65-74 = 1,  >75 =2; stroke/embolism= 2).    No orders of the defined types were placed in this encounter.   Medications Discontinued During This Encounter  Medication Reason   carvedilol (COREG) 12.5 MG tablet Dose change    Recommendations:   Saif Peter  is a 69 y.o. Caucasian male with CABG in [place] 02/18/2004: RIMA to LAD, LIMA to obtuse marginal, SVG to diagonal and SVG to PDA, who has diabetes mellitus, diabetic nephropathy with ESRD on HD, peripheral neuropathy and diabetic foot ulcer right, S/P Sim's amputation of the right foot and now has chronic ulcer, amputation of his left 2 toes due to diabetic foot ulcer in the past.  He still has a dry wound in his right leg but is healing well.  I had seen him 2 weeks ago with worsening dyspnea, fatigue.  He was found to be in new onset atrial flutter on 03/08/2021.  I reviewed the results of the stress test, it is nonischemic but he does have mildly reduced LVEF by echocardiogram and stress test.  He has been scheduled for direct-current cardioversion next week, he has been tolerating Eliquis but has prolonged bleeding after access.  He is accompanied by his wife.  He will keep an appointment with Dr. [name] this morning, I have tentatively scheduled him for direct-current cardioversion on Tuesday.  Suspect he may be able to come off of anticoagulation if Dr. [name] feels that atrial flutter is typical and he may benefit from EP study/ablation as well.  With regard to coronary artery disease, he is doing well, with regard to reduced LVEF, there is no clinical evidence of heart failure.  No leg edema, no JVD.  I will see him back in 4 weeks for follow-up.  questions answered.   Adrian Prows, MD, [facility] 03/24/2020, 9:41 AM Office: [phone] Pager: [phone]   CC: [name], MD

## 2020-03-25 ENCOUNTER — Other Ambulatory Visit (HOSPITAL_COMMUNITY): Payer: Self-pay

## 2020-03-28 ENCOUNTER — Ambulatory Visit (HOSPITAL_COMMUNITY): Admission: RE | Admit: 2020-03-28 | Payer: Medicare Other | Source: Ambulatory Visit | Admitting: Cardiology

## 2020-03-28 ENCOUNTER — Encounter (HOSPITAL_COMMUNITY): Admission: RE | Payer: Self-pay | Source: Ambulatory Visit

## 2020-03-28 SURGERY — CARDIOVERSION
Anesthesia: General

## 2020-04-01 ENCOUNTER — Telehealth: Payer: Self-pay | Admitting: Student

## 2020-04-01 DIAGNOSIS — R0602 Shortness of breath: Secondary | ICD-10-CM

## 2020-04-01 DIAGNOSIS — R110 Nausea: Secondary | ICD-10-CM

## 2020-04-01 DIAGNOSIS — R5383 Other fatigue: Secondary | ICD-10-CM

## 2020-04-01 NOTE — Telephone Encounter (Addendum)
ON-CALL CARDIOLOGY 04/01/20  Patient's name: [name].   MRN: [id number].    DOB: [birth year] Primary care provider: Lorenda Hatchet, [place]. Primary cardiologist: Dr. Einar Gip  Interaction regarding this patient's care today: Patient called with complaint that he is extremely fatigued, nauseous, and short of breath. Reports over the last 1 week he has been experiencing worsening fatigue and nausea, as well as cough. Last night he reports symptoms became acutely worse and he has since been having occasional shortness of breath. He has been unable to get out of bed due to weakness and fatigue Denies chest pain, dizziness, syncope, near syncope, palpitations.   In view of worsening of symptoms and recent new onset atrial flutter (03/08/2020), recommend patient seek evaluation in the emergency department. Patient and his wife verbalized understanding and agreement.   Impression:   ICD-10-CM   1. Shortness of breath  R06.02   2. Other fatigue  R53.83   3. Nausea  R11.0     No orders of the defined types were placed in this encounter.   No orders of the defined types were placed in this encounter.   Telephone encounter total time: 15 minutes     Alethia Berthold, PA-C 04/01/2020, 12:00 PM Office: [phone]

## 2020-04-02 NOTE — Telephone Encounter (Signed)
Unless acutely dyspneic, this may be chronic. Maybe taking more fluid off during dialysis may help. Prefer not to go to the hospital in view of Covid and long wait times.  JG

## 2020-04-04 ENCOUNTER — Other Ambulatory Visit (HOSPITAL_COMMUNITY)
Admission: RE | Admit: 2020-04-04 | Discharge: 2020-04-04 | Disposition: A | Discharge status: Home / Self Care | Payer: Medicare Other | Source: Ambulatory Visit | Attending: Internal Medicine | Admitting: Internal Medicine

## 2020-04-04 DIAGNOSIS — Z20822 Contact with and (suspected) exposure to covid-19: Secondary | ICD-10-CM | POA: Insufficient documentation

## 2020-04-04 DIAGNOSIS — Z01812 Encounter for preprocedural laboratory examination: Secondary | ICD-10-CM | POA: Diagnosis present

## 2020-04-04 LAB — SARS CORONAVIRUS 2 (TAT 6-24 HRS): SARS Coronavirus 2: NEGATIVE

## 2020-04-05 NOTE — Progress Notes (Signed)
Attempted to call patient regarding procedure instructions for tomorrow.  Left voice mail- arrival time 7:30, nothing to eat or drink after midnight, need responsible adult to drive you home and stay with you 1st 24 hours.  No medications in the morning,  Be sure to take xarelto today.

## 2020-04-06 ENCOUNTER — Ambulatory Visit (HOSPITAL_COMMUNITY)
Admission: RE | Admit: 2020-04-06 | Discharge: 2020-04-06 | Disposition: A | Discharge status: Home / Self Care | Payer: Medicare Other | Attending: Internal Medicine | Admitting: Internal Medicine

## 2020-04-06 ENCOUNTER — Encounter (HOSPITAL_COMMUNITY)
Admission: RE | Disposition: A | Discharge status: Home / Self Care | Payer: Self-pay | Source: Home / Self Care | Attending: Internal Medicine

## 2020-04-06 ENCOUNTER — Other Ambulatory Visit: Payer: Self-pay

## 2020-04-06 DIAGNOSIS — I4892 Unspecified atrial flutter: Secondary | ICD-10-CM

## 2020-04-06 DIAGNOSIS — I509 Heart failure, unspecified: Secondary | ICD-10-CM | POA: Diagnosis not present

## 2020-04-06 DIAGNOSIS — Z951 Presence of aortocoronary bypass graft: Secondary | ICD-10-CM | POA: Diagnosis not present

## 2020-04-06 DIAGNOSIS — I483 Typical atrial flutter: Secondary | ICD-10-CM | POA: Insufficient documentation

## 2020-04-06 DIAGNOSIS — Z87891 Personal history of nicotine dependence: Secondary | ICD-10-CM | POA: Insufficient documentation

## 2020-04-06 DIAGNOSIS — I132 Hypertensive heart and chronic kidney disease with heart failure and with stage 5 chronic kidney disease, or end stage renal disease: Secondary | ICD-10-CM | POA: Diagnosis not present

## 2020-04-06 DIAGNOSIS — Z79899 Other long term (current) drug therapy: Secondary | ICD-10-CM | POA: Insufficient documentation

## 2020-04-06 DIAGNOSIS — E1122 Type 2 diabetes mellitus with diabetic chronic kidney disease: Secondary | ICD-10-CM | POA: Insufficient documentation

## 2020-04-06 DIAGNOSIS — N186 End stage renal disease: Secondary | ICD-10-CM | POA: Diagnosis not present

## 2020-04-06 DIAGNOSIS — I2510 Atherosclerotic heart disease of native coronary artery without angina pectoris: Secondary | ICD-10-CM | POA: Diagnosis not present

## 2020-04-06 DIAGNOSIS — Z7901 Long term (current) use of anticoagulants: Secondary | ICD-10-CM | POA: Insufficient documentation

## 2020-04-06 DIAGNOSIS — Z79890 Hormone replacement therapy (postmenopausal): Secondary | ICD-10-CM | POA: Diagnosis not present

## 2020-04-06 DIAGNOSIS — Z992 Dependence on renal dialysis: Secondary | ICD-10-CM | POA: Diagnosis not present

## 2020-04-06 HISTORY — PX: A-FLUTTER ABLATION: EP1230

## 2020-04-06 LAB — GLUCOSE, CAPILLARY
Glucose-Capillary: 136 mg/dL — ABNORMAL HIGH (ref 70–99)
Glucose-Capillary: 115 mg/dL — ABNORMAL HIGH (ref 70–99)

## 2020-04-06 SURGERY — A-FLUTTER ABLATION

## 2020-04-06 MED ORDER — ONDANSETRON HCL 4 MG/2ML IJ SOLN: 4.0000 mg | Freq: Four times a day (QID) | INTRAMUSCULAR | Status: DC | PRN

## 2020-04-06 MED ORDER — HYDRALAZINE HCL 20 MG/ML IJ SOLN
INTRAMUSCULAR | Status: AC
  Administered 2020-04-06: 5 mg via INTRAVENOUS
  Filled 2020-04-06: qty 1

## 2020-04-06 MED ORDER — SODIUM CHLORIDE 0.9 % IV SOLN: 250.0000 mL | INTRAVENOUS | Status: DC | PRN

## 2020-04-06 MED ORDER — BUPIVACAINE HCL (PF) 0.25 % IJ SOLN
INTRAMUSCULAR | Status: AC
  Filled 2020-04-06: qty 60

## 2020-04-06 MED ORDER — BUPIVACAINE HCL (PF) 0.25 % IJ SOLN
INTRAMUSCULAR | Status: DC | PRN
  Administered 2020-04-06: 60 mL

## 2020-04-06 MED ORDER — FENTANYL CITRATE (PF) 100 MCG/2ML IJ SOLN
INTRAMUSCULAR | Status: DC | PRN
  Administered 2020-04-06: 25 ug via INTRAVENOUS
  Administered 2020-04-06 (×2): 12.5 ug via INTRAVENOUS
  Administered 2020-04-06 (×2): 25 ug via INTRAVENOUS

## 2020-04-06 MED ORDER — SODIUM CHLORIDE 0.9% FLUSH: 3.0000 mL | INTRAVENOUS | Status: DC | PRN

## 2020-04-06 MED ORDER — FUROSEMIDE 10 MG/ML IJ SOLN: 40.0000 mg | Freq: Once | INTRAMUSCULAR | Status: DC

## 2020-04-06 MED ORDER — MIDAZOLAM HCL 5 MG/5ML IJ SOLN
INTRAMUSCULAR | Status: AC
  Filled 2020-04-06: qty 5

## 2020-04-06 MED ORDER — SODIUM CHLORIDE 0.9 % IV SOLN: INTRAVENOUS | Status: DC

## 2020-04-06 MED ORDER — ACETAMINOPHEN 325 MG PO TABS: 650.0000 mg | ORAL_TABLET | ORAL | Status: DC | PRN

## 2020-04-06 MED ORDER — HEPARIN SODIUM (PORCINE) 1000 UNIT/ML IJ SOLN
INTRAMUSCULAR | Status: AC
  Filled 2020-04-06: qty 1

## 2020-04-06 MED ORDER — HEPARIN SODIUM (PORCINE) 1000 UNIT/ML IJ SOLN
INTRAMUSCULAR | Status: DC | PRN
  Administered 2020-04-06: 1000 [IU] via INTRAVENOUS

## 2020-04-06 MED ORDER — HEPARIN (PORCINE) IN NACL 1000-0.9 UT/500ML-% IV SOLN
INTRAVENOUS | Status: DC | PRN
  Administered 2020-04-06: 500 mL

## 2020-04-06 MED ORDER — FENTANYL CITRATE (PF) 100 MCG/2ML IJ SOLN
INTRAMUSCULAR | Status: AC
  Filled 2020-04-06: qty 2

## 2020-04-06 MED ORDER — FUROSEMIDE 10 MG/ML IJ SOLN
INTRAMUSCULAR | Status: AC
  Filled 2020-04-06: qty 4

## 2020-04-06 MED ORDER — SODIUM CHLORIDE 0.9% FLUSH: 3.0000 mL | Freq: Two times a day (BID) | INTRAVENOUS | Status: DC

## 2020-04-06 MED ORDER — HEPARIN (PORCINE) IN NACL 1000-0.9 UT/500ML-% IV SOLN
INTRAVENOUS | Status: AC
  Filled 2020-04-06: qty 500

## 2020-04-06 MED ORDER — HYDRALAZINE HCL 20 MG/ML IJ SOLN: 5.0000 mg | Freq: Once | INTRAMUSCULAR | Status: AC

## 2020-04-06 MED ORDER — MIDAZOLAM HCL 5 MG/5ML IJ SOLN
INTRAMUSCULAR | Status: DC | PRN
  Administered 2020-04-06 (×2): 2 mg via INTRAVENOUS
  Administered 2020-04-06 (×3): 1 mg via INTRAVENOUS

## 2020-04-06 SURGICAL SUPPLY — 13 items
BAG SNAP BAND KOVER 36X36 (MISCELLANEOUS) ×2 IMPLANT
CATH JOSEPH QUAD ALLRED 6F REP (CATHETERS) ×2 IMPLANT
CATH SMTCH THERMOCOOL SF FJ (CATHETERS) ×2 IMPLANT
CATH WEB BI DIR CSDF CRV REPRO (CATHETERS) ×2 IMPLANT
MAT PREVALON FULL STRYKER (MISCELLANEOUS) ×2 IMPLANT
PACK EP LATEX FREE (CUSTOM PROCEDURE TRAY) ×2
PACK EP LF (CUSTOM PROCEDURE TRAY) ×1 IMPLANT
PAD PRO RADIOLUCENT 2001M-C (PAD) ×2 IMPLANT
PATCH CARTO3 (PAD) ×2 IMPLANT
SHEATH PINNACLE 6F 10CM (SHEATH) ×2 IMPLANT
SHEATH PINNACLE 7F 10CM (SHEATH) ×2 IMPLANT
SHEATH PINNACLE 8F 10CM (SHEATH) ×2 IMPLANT
TUBING SMART ABLATE COOLFLOW (TUBING) ×2 IMPLANT

## 2020-04-06 NOTE — Discharge Instructions (Signed)
Cardiac Ablation, Care After This sheet gives you information about how to care for yourself after your procedure. Your health care provider may also give you more specific instructions. If you have problems or questions, contact your health care provider. What can I expect after the procedure? After the procedure, it is common to have:  Bruising around your puncture site.  Tenderness around your puncture site.  Skipped heartbeats.  Tiredness (fatigue). Follow these instructions at home: Puncture site care   Follow instructions from your health care provider about how to take care of your puncture site. Make sure you: ? Wash your hands with soap and water before you change your bandage (dressing). If soap and water are not available, use hand sanitizer. ? Change your dressing as told by your health care provider. ? Leave stitches (sutures), skin glue, or adhesive strips in place. These skin closures may need to stay in place for up to 2 weeks. If adhesive strip edges start to loosen and curl up, you may trim the loose edges. Do not remove adhesive strips completely unless your health care provider tells you to do that.  Check your puncture site every day for signs of infection. Check for: ? Redness, swelling, or pain. ? Fluid or blood. If your puncture site starts to bleed, lie down on your back, apply firm pressure to the area, and contact your health care provider. ? Warmth. ? Pus or a bad smell. Driving  Ask your health care provider when it is safe for you to drive again after the procedure.  Do not drive or use heavy machinery while taking prescription pain medicine.  Do not drive for 24 hours if you were given a medicine to help you relax (sedative) during your procedure. Activity  Avoid activities that take a lot of effort for at least 3 days after your procedure.  Do not lift anything that is heavier than 10 lb (4.5 kg), or the limit that you are told, until your health  care provider says that it is safe.  Return to your normal activities as told by your health care provider. Ask your health care provider what activities are safe for you. General instructions  Take over-the-counter and prescription medicines only as told by your health care provider.  Do not use any products that contain nicotine or tobacco, such as cigarettes and e-cigarettes. If you need help quitting, ask your health care provider.  Do not take baths, swim, or use a hot tub until your health care provider approves.  Do not drink alcohol for 24 hours after your procedure.  Keep all follow-up visits as told by your health care provider. This is important. Contact a health care provider if:  You have redness, mild swelling, or pain around your puncture site.  You have fluid or blood coming from your puncture site that stops after applying firm pressure to the area.  Your puncture site feels warm to the touch.  You have pus or a bad smell coming from your puncture site.  You have a fever.  You have chest pain or discomfort that spreads to your neck, jaw, or arm.  You are sweating a lot.  You feel nauseous.  You have a fast or irregular heartbeat.  You have shortness of breath.  You are dizzy or light-headed and feel the need to lie down.  You have pain or numbness in the arm or leg closest to your puncture site. Get help right away if:  Your puncture   site suddenly swells.  Your puncture site is bleeding and the bleeding does not stop after applying firm pressure to the area. These symptoms may represent a serious problem that is an emergency. Do not wait to see if the symptoms will go away. Get medical help right away. Call your local emergency services (911 in the [place] Do not drive yourself to the hospital. Summary  After the procedure, it is normal to have bruising and tenderness at the puncture site in your groin, neck, or forearm.  Check your puncture site every  day for signs of infection.  Get help right away if your puncture site is bleeding and the bleeding does not stop after applying firm pressure to the area. This is a medical emergency. This information is not intended to replace advice given to you by your health care provider. Make sure you discuss any questions you have with your health care provider. Document Revised: 03/07/2017 Document Reviewed: 07/04/2016 Elsevier Patient Education  [place].   Femoral Site Care This sheet gives you information about how to care for yourself after your procedure. Your health care provider may also give you more specific instructions. If you have problems or questions, contact your health care provider. What can I expect after the procedure? After the procedure, it is common to have:  Bruising that usually fades within 1-2 weeks.  Tenderness at the site. Follow these instructions at home: Wound care  Follow instructions from your health care provider about how to take care of your insertion site. Make sure you: ? Wash your hands with soap and water before you change your bandage (dressing). If soap and water are not available, use hand sanitizer. ? Change your dressing as told by your health care provider. ? Leave stitches (sutures), skin glue, or adhesive strips in place. These skin closures may need to stay in place for 2 weeks or longer. If adhesive strip edges start to loosen and curl up, you may trim the loose edges. Do not remove adhesive strips completely unless your health care provider tells you to do that.  Do not take baths, swim, or use a hot tub until your health care provider approves.  You may shower 24-48 hours after the procedure or as told by your health care provider. ? Gently wash the site with plain soap and water. ? Pat the area dry with a clean towel. ? Do not rub the site. This may cause bleeding.  Do not apply powder or lotion to the site. Keep the site clean and  dry.  Check your femoral site every day for signs of infection. Check for: ? Redness, swelling, or pain. ? Fluid or blood. ? Warmth. ? Pus or a bad smell. Activity  For the first 2-3 days after your procedure, or as long as directed: ? Avoid climbing stairs as much as possible. ? Do not squat.  Do not lift anything that is heavier than 10 lb (4.5 kg), or the limit that you are told, until your health care provider says that it is safe.  Rest as directed. ? Avoid sitting for a long time without moving. Get up to take short walks every 1-2 hours.  Do not drive for 24 hours if you were given a medicine to help you relax (sedative). General instructions  Take over-the-counter and prescription medicines only as told by your health care provider.  Keep all follow-up visits as told by your health care provider. This is important. Contact a health  care provider if you have:  A fever or chills.  You have redness, swelling, or pain around your insertion site. Get help right away if:  The catheter insertion area swells very fast.  You pass out.  You suddenly start to sweat or your skin gets clammy.  The catheter insertion area is bleeding, and the bleeding does not stop when you hold steady pressure on the area.  The area near or just beyond the catheter insertion site becomes pale, cool, tingly, or numb. These symptoms may represent a serious problem that is an emergency. Do not wait to see if the symptoms will go away. Get medical help right away. Call your local emergency services (911 in the [place] Do not drive yourself to the hospital. Summary  After the procedure, it is common to have bruising that usually fades within 1-2 weeks.  Check your femoral site every day for signs of infection.  Do not lift anything that is heavier than 10 lb (4.5 kg), or the limit that you are told, until your health care provider says that it is safe. This information is not intended to replace  advice given to you by your health care provider. Make sure you discuss any questions you have with your health care provider. Document Revised: 04/07/2017 Document Reviewed: 04/07/2017 Elsevier Patient Education  2020 [employer].

## 2020-04-06 NOTE — Progress Notes (Signed)
Dr [name] notified of client with cough and client's b/p and order noted

## 2020-04-06 NOTE — Progress Notes (Signed)
Dr Taylor in and ok to d/c home 

## 2020-04-06 NOTE — Progress Notes (Signed)
Site area: rt groin fv x3 Site Prior to Removal:  Level 0 Pressure Applied For: 15 minutes Manual:   yes Patient Status During Pull:  stable Post Pull Site:  Level 0 Post Pull Instructions Given:  yes Post Pull Pulses Present: rt dp 1+ Dressing Applied:  Gauze and tegaderm Bedrest begins @ 1330 Comments: IV saline locked

## 2020-04-06 NOTE — Interval H&P Note (Signed)
History and Physical Interval Note:  04/06/2020 10:41 AM  [name]  has presented today for surgery, with the diagnosis of aflutter.  The various methods of treatment have been discussed with the patient and family. After consideration of risks, benefits and other options for treatment, the patient has consented to  Procedure(s): A-FLUTTER ABLATION (N/A) as a surgical intervention.  The patient's history has been reviewed, patient examined, no change in status, stable for surgery.  I have reviewed the patient's chart and labs.  Questions were answered to the patient's satisfaction.     [name]

## 2020-04-10 ENCOUNTER — Encounter (HOSPITAL_COMMUNITY): Payer: Self-pay | Admitting: Internal Medicine

## 2020-04-14 ENCOUNTER — Encounter: Payer: Self-pay | Admitting: Internal Medicine

## 2020-04-14 ENCOUNTER — Ambulatory Visit (INDEPENDENT_AMBULATORY_CARE_PROVIDER_SITE_OTHER): Payer: Medicare Other | Admitting: Internal Medicine

## 2020-04-14 ENCOUNTER — Other Ambulatory Visit: Payer: Self-pay

## 2020-04-14 DIAGNOSIS — R053 Chronic cough: Secondary | ICD-10-CM

## 2020-04-14 DIAGNOSIS — J90 Pleural effusion, not elsewhere classified: Secondary | ICD-10-CM

## 2020-04-14 MED ORDER — BENZONATATE 200 MG PO CAPS: 200.0000 mg | ORAL_CAPSULE | Freq: Three times a day (TID) | ORAL | 2 refills | Status: DC | PRN

## 2020-04-14 NOTE — Progress Notes (Signed)
[name], male    DOB: [birth year],     MRN: [id number]   Brief patient profile:  31 yowm quit smoking 1990 no sequelae with  hbp /dm/ IHD s/p cabg 2005 [place], [place] on HD since early  of Jan 2021 non-stop since since so referred to pulmonary clinic 06/25/2019 by Dr   [name]    History of Present Illness  06/25/2019  Pulmonary/ 1st [name] eval/[name]  Chief Complaint  Patient presents with  . Pulmonary Consult    Referred by Dr [name]. Pt c/o cough x 3 months- non prod.   Dyspnea:  Not limited by breathing from desired activities  Including wt lifting  Cough: indolent onset 3 m prior to OV   of persitent daily dry tickle in throat not really much production at all, no assoc nasal symptoms or prior h/o seasonal rhinitis or even much problem with colds in past. Sleep: at hs worse ? Better p inhaler, tessalon immediately on waking/ bed is flat with two big pillws and dog in bedroom  SABA use: 4 total per per day  Lots of halls not helping  Kouffman Reflux v Neurogenic Cough Differentiator Reflux Comments  Do you awaken from a sound sleep coughing violently?                            With trouble breathing? No sometimes   Do you have choking episodes when you cannot  Get enough air, gasping for air ?              When it's severe   Do you usually cough when you lie down into  The bed, or when you just lie down to rest ?                          Yes   Do you usually cough after meals or eating?         No   Do you cough when (or after) you bend over?    no   GERD SCORE     Kouffman Reflux v Neurogenic Cough Differentiator Neurogenic   Do you more-or-less cough all day long? sporadic   Does change of temperature make you cough? Cold    Does laughing or chuckling cause you to cough? no   Do fumes (perfume, automobile fumes, burned  Toast, etc.,) cause you to cough ?      no   Does speaking, singing, or talking on the phone cause you to cough   ?               [name]     Neurogenic/Airway score    REC Pantoprazole (protonix) 40 mg   Take  30-60 min before first meal of the day and Pepcid (famotidine)  20 mg one after supper  until return to [name]  For cough > tessalon 200 mg every 6-8 hours to eliminate all the coughing  gerd diet  Depomedrol 120 mg IM today        07/23/2019  f/u ov/[name] re: cough since first of 2021  Chief Complaint  Patient presents with  . Follow-up    Cough has resolved. He is taking the tessalon about 2 x per day. He has not had to use his albuterol.   Dyspnea:  Not limited by breathing from desired activities  No real aerobics Cough: gone but still using tessalon bid  Sleeping: slt elevation  pillows  SABA use: none  02: none rec Tessalon and f/u in 6 weeks > did not return       NP  Ov 01/28/20 2-3 weeks  Dry cough  Worse at night Using tessalon pearles every night  Worsened shortness of breath at night   Stopped reflux meds in June  2021   1. Chronic cough  - omeprazole (PRILOSEC) 40 MG capsule; Take 1 capsule (40 mg total) by mouth daily.  Dispense: 30 capsule; Refill: 3 - famotidine (PEPCID) 20 MG tablet; Take 1 tablet (20 mg total) by mouth at bedtime.  Dispense: 30 tablet; Refill: 3 - DG Chest 2 View; Future  Restart omeprazole 40 mg tablet  >>>Please take 1 tablet daily 15 minutes to 30 minutes before your first meal of the day as well as before your other medications >>>Try to take at the same time each day >>>take this medication daily  Restart Pepcid 20 mg at night  GERD management: >>>Avoid laying flat until 2 hours after meals >>>Elevate head of the bed including entire chest >>>Reduce size of meals and amount of fat, acid, spices, caffeine and sweets >>>If you are smoking, Please stop! >>>Decrease alcohol consumption >>>Work on maintaining a healthy weight with normal BMI   Cough Home Instructions:  We believe you have a chronic/cyclical cough that is aggravated by reflux , coughing ,  and drainage.  . Goal is to not Cough or clear throat.  Marland Kitchen Avoid coughing or clearing throat by using:  o non-mint products/sugarless candy o Water o ice chips o Remember NO MINT PRODUCTS  . Medications to use:  o Mucinex DM 1-2 every 12 hrs or Delsym 2 tsp every 12 hrs for cough (These are Over the counter) o Tessalon Three times a day  As needed  Cough.  o Prilosec 40mg  30 min before breakfast or dinner.  o Pepcid 20mg  before dinner  o Zyrtec 10mg  at bedtime (Can use generic, this is over the counter) o Chlor tabs 4mg  2 at bedtime  for nasal drip until cough is 100% cough free. (this medication is over the counter)    02/24/2020  Acute  ov/[name]/[name] re: severe  cough since early oct 2021  Chief Complaint  Patient presents with  . Follow-up    non productive cough gets SOB when coughing  previous cough 100% gone until returned to in center HD now back at home  Just started 02/11/20  Dyspnea: worse lying down / no problem walking with cane  Cough: after supper seems worse/ hacking dry cough Sleeping: flat bed/ 2 pillows under  head  SABA use: no better p saba  02: none  Not using 1st gen H1 blockers per guidelines and using mints (mentos) rec Pepcid 20 mg after supper   Omeprazole 40 mg Take 30-60 min before first meal of the day  GERD  Depomedrol 120 mg IM  Tessalon 200 mg about  Every 8 hours around the clock and supplement if needed  with either vicodin or percocet every 4 hours  As needed x 3 straight days For drainage / throat tickle try take CHLORPHENIRAMINE  4 mg  Please remember to go to the  x-ray department  > bilateral effusions    Keep the [facility] appointment  Add: needs echo to eval effusions > [name] dx afib, planning cardioversion    03/16/2020  f/u ov/[name] re:  Home hemodialysis new afib with fatigue > sob and cough are better  Chief Complaint  Patient presents with  . Follow-up    Pt cough improved some. He notices most at night and early  am- non prod.   Dyspnea:  Not limited by breathing R foot/ 4 pronged walker Cough: dry cough mostly daytime, tessalon helps ? If better with 1st gen H1 blockers per guidelines  And  no noct doses of chlorpheniramine needed / uses daytime doesn't think it makes him sleepy,   Sleeping: flat bed/ 2 pillows better  SABA use: none 02: none  Rec Keep appt for January to follow up the fluid in your lungs after cardioversion        04/14/2020  f/u ov/Bryssa Tones re: chronic cough x Jan 2021/ B effusions s/p cardioversion 04/06/20  Chief Complaint  Patient presents with  . Follow-up    Cough has improved slightly and is non prod.   Dyspnea:  Limited by R foot / balance poor /ok pushing cart at HT no limiting sob  Cough: 75% better pm tessalon 200 qid / dry  Sleeping: flat bed/ 2pillows  SABA use: none 02: none  HD after supper / not taking hs pepcid   No obvious day to day or daytime variability or assoc excess/ purulent sputum or mucus plugs or hemoptysis or cp or chest tightness, subjective wheeze or overt sinus or hb symptoms.   Sleeping  without nocturnal  or early am exacerbation  of respiratory  c/o's or need for noct saba. Also denies any obvious fluctuation of symptoms with weather or environmental changes or other aggravating or alleviating factors except as outlined above   No unusual exposure hx or h/o childhood pna/ asthma or knowledge of premature birth.  Current Allergies, Complete Past Medical History, Past Surgical History, Family History, and Social History were reviewed in [employer] record.  ROS  The following are not active complaints unless bolded Hoarseness, sore throat, dysphagia, dental problems, itching, sneezing,  nasal congestion or discharge of excess mucus or purulent secretions, ear ache,   fever, chills, sweats, unintended wt loss or wt gain, classically pleuritic or exertional cp,  orthopnea pnd or arm/hand swelling  or leg swelling,  presyncope, palpitations, abdominal pain, anorexia, nausea, vomiting, diarrhea  or change in bowel habits or change in bladder habits, change in stools or change in urine, dysuria, hematuria,  rash, arthralgias, visual complaints, headache, numbness, weakness or ataxia or problems with walking or coordination,  change in mood or  memory.        Current Meds  Medication Sig  . acetaminophen (TYLENOL) 500 MG tablet Take 1,500 mg by mouth every 8 (eight) hours as needed for moderate pain.  Marland Kitchen allopurinol (ZYLOPRIM) 100 MG tablet Take 200 mg by mouth daily.   . B Complex-C-Zn-Folic Acid (DIALYVITE 062 WITH ZINC) 0.8 MG TABS Take 1 tablet by mouth daily.  . benzonatate (TESSALON) 200 MG capsule Take 1 capsule (200 mg total) by mouth 3 (three) times daily as needed for cough.  . calcitRIOL (ROCALTROL) 0.5 MCG capsule Take 0.5 mcg by mouth daily.   . carvedilol (COREG) 12.5 MG tablet Take 12.5 mg by mouth in the morning and at bedtime.  . chlorhexidine (PERIDEX) 0.12 % solution 15 mLs by Mouth Rinse route daily.  . diclofenac Sodium (VOLTAREN) 1 % GEL Apply 1 application topically 4 (four) times daily as needed (pain).  . famotidine (PEPCID) 20 MG tablet Take 1 tablet (20 mg total) by mouth at bedtime. (Patient taking differently: Take 20 mg by mouth  daily.)  . furosemide (LASIX) 40 MG tablet Take 40 mg by mouth daily.  . Lancets (ONETOUCH DELICA PLUS [id number]) MISC   . levothyroxine (SYNTHROID, LEVOTHROID) 112 MCG tablet Take 112 mcg by mouth daily before breakfast. BRAND NAME SYNTHROID  . Methoxy PEG-Epoetin Beta (MIRCERA IJ) Inject into the skin.  Marland Kitchen omeprazole (PRILOSEC) 40 MG capsule Take 1 capsule (40 mg total) by mouth daily. (Patient taking differently: Take 40 mg by mouth at bedtime as needed (acid reflux/indigestion.).)  . oxyCODONE-acetaminophen (PERCOCET/ROXICET) 5-325 MG tablet Take 1 tablet by mouth every 4 (four) hours as needed.  Vladimir Faster Glycol-Propyl Glycol (LUBRICANT EYE DROPS)  0.4-0.3 % SOLN Place 1-2 drops into both eyes 3 (three) times daily as needed (dry/irritated eyes.).  Marland Kitchen pregabalin (LYRICA) 150 MG capsule Take 150 mg by mouth in the morning and at bedtime.   . Rivaroxaban (XARELTO) 15 MG TABS tablet Take 1 tablet (15 mg total) by mouth daily with supper.  . rosuvastatin (CRESTOR) 10 MG tablet Take 10 mg by mouth daily.   . Semaglutide,0.25 or 0.5MG /DOS, (OZEMPIC, 0.25 OR 0.5 MG/DOSE,) 2 MG/1.5ML SOPN Inject 0.5 mg into the skin every Friday.  . sevelamer carbonate (RENVELA) 800 MG tablet Take 2,400 mg by mouth daily.                 Past Medical History:  Diagnosis Date  . Ambulates with cane    straight cane  . Cervical myelopathy ([place]) 02/06/2018  . Chronic kidney disease    dailysis [facility]  . Coronary artery disease   . Diabetes mellitus without complication ([place])    type2  . Diabetic foot ulcer ([place])   . Diabetic foot ulcer ([place])   . Diabetic neuropathy ([place]) 02/06/2018  . Gait abnormality 08/24/2018  . GERD (gastroesophageal reflux disease)   . History of kidney stones    passed stones  . Hypercholesteremia   . Hypertension   . Hypothyroidism   . Neuromuscular disorder ([facility])    neuropathy left leg and bilateral feet  . Neuropathy   . Prostate cancer ([place])        Objective:     04/14/2020         202 03/16/2020       199 02/24/2020     204   07/23/19 203 lb 12.8 oz (92.4 kg)  07/20/19 209 lb 3.2 oz (94.9 kg)  06/28/19 200 lb (90.7 kg)     Vital signs reviewed  04/14/2020  - Note at rest 02 sats  97% on RA   General appearance:    slt hoarse somber  wm walks with 4 pronged cane     HEENT : pt wearing mask not removed for exam due to covid -19 concerns.    NECK :  without JVD/Nodes/TM/ nl carotid upstrokes bilaterally   LUNGS: no acc muscle use,  Nl contour chest decreased bs bases L >R bilaterally without cough on insp or exp maneuvers   CV:  RRR  no s3 or murmur or increase in P2, and no edema   ABD:  soft and  nontender with nl inspiratory excursion in the supine position. No bruits or organomegaly appreciated, bowel sounds nl  MS:  Nl gait/ ext warm without deformities, calf tenderness, cyanosis or clubbing No obvious joint restrictions   SKIN: warm and dry without lesions    NEURO:  alert, approp, nl sensorium with  no motor or cerebellar deficits apparent.  Assessment

## 2020-04-14 NOTE — Patient Instructions (Addendum)
Be sure to ask you kidney about timing of medications relative to your dialysis  Pepcid 20 mg at bedtime   For drainage / throat tickle try take CHLORPHENIRAMINE  4 mg  (Chlortab 4mg   at [employer] should be easiest to find in the green box)  take one every 4 hours as needed - available over the counter- may cause drowsiness so start with just a bedtime dose or two and see how you tolerate it before trying in daytime     Please schedule a follow up office visit in 6 weeks, call sooner if needed with cxr on return

## 2020-04-14 NOTE — Assessment & Plan Note (Signed)
New finding  01/27/20 in HD pt  - [place] w/u 02/2020 dx afib> s/p caridoversion 04/06/20  Still has findings of effusion L>R so f/u in 6 weeks p cardioversion with cxr/ tap prn   Discussed in detail all the  indications, usual  risks and alternatives  relative to the benefits with patient who agrees to proceed with Rx as outlined.             Each maintenance medication was reviewed in detail including emphasizing most importantly the difference between maintenance and prns and under what circumstances the prns are to be triggered using an action plan format where appropriate.  Total time for H and P, chart review, counseling,   and generating customized AVS unique to this office visit / charting = 60min

## 2020-04-14 NOTE — Assessment & Plan Note (Signed)
Onset around 1st of 2021  - Allergy profile 06/25/2019 >  Eos 0.2 /  IgE 10  RAST neg  - max gerd rx 06/25/2019 > much better 07/23/2019 rec taper tessalon down/ off - recurrent cough while off gerd rx early oct 2021 assoc with new bilateral pl effusions - cyclical cough regimen 16/01/9603  - cardioversion 04/06/20   Improved with tessalon and gerd rx though not using 1st gen H1 blockers per guidelines and not clear how much the effusions are contributing but the absence of sob and also absence of cough on insp favor this is Upper airway cough syndrome (previously labeled PNDS),  is so named because it's frequently impossible to sort out how much is  CR/sinusitis with freq throat clearing (which can be related to primary GERD)   vs  causing  secondary (" extra esophageal")  GERD from wide swings in gastric pressure that occur with throat clearing, often  promoting self use of mint and menthol lozenges that reduce the lower esophageal sphincter tone and exacerbate the problem further in a cyclical fashion.   These are the same pts (now being labeled as having "irritable larynx syndrome" by some cough centers) who not infrequently have a history of having failed to tolerate ace inhibitors,  dry powder inhalers or biphosphonates or report having atypical/extraesophageal reflux symptoms that don't respond to standard doses of PPI  and are easily confused as having aecopd or asthma flares by even experienced allergists/ pulmonologists (myself included).   rec max gerd rx/ 1st gen H1 blockers per guidelines  And f/u in 6 weeks

## 2020-04-21 ENCOUNTER — Other Ambulatory Visit: Payer: Self-pay

## 2020-04-21 ENCOUNTER — Encounter: Payer: Self-pay | Admitting: Cardiology

## 2020-04-21 ENCOUNTER — Ambulatory Visit: Payer: Medicare Other | Admitting: Cardiology

## 2020-04-21 ENCOUNTER — Telehealth: Payer: Self-pay

## 2020-04-21 ENCOUNTER — Other Ambulatory Visit: Payer: Self-pay | Admitting: Pulmonary Disease

## 2020-04-21 VITALS — BP 146/74 | HR 76 | Ht 70.0 in | Wt 190.0 lb

## 2020-04-21 DIAGNOSIS — R053 Chronic cough: Secondary | ICD-10-CM

## 2020-04-21 DIAGNOSIS — N186 End stage renal disease: Secondary | ICD-10-CM

## 2020-04-21 DIAGNOSIS — I5021 Acute systolic (congestive) heart failure: Secondary | ICD-10-CM

## 2020-04-21 DIAGNOSIS — I2510 Atherosclerotic heart disease of native coronary artery without angina pectoris: Secondary | ICD-10-CM

## 2020-04-21 DIAGNOSIS — I483 Typical atrial flutter: Secondary | ICD-10-CM

## 2020-04-21 DIAGNOSIS — Z8679 Personal history of other diseases of the circulatory system: Secondary | ICD-10-CM

## 2020-04-21 DIAGNOSIS — I10 Essential (primary) hypertension: Secondary | ICD-10-CM

## 2020-04-21 NOTE — Telephone Encounter (Signed)
Patient called he is requesting a rx for a right custom double upright ato and lateral t strap brace for his right foot for [employer]. He will come and pick it up today CB:[phone]

## 2020-04-21 NOTE — Telephone Encounter (Signed)
Patient aware.

## 2020-04-21 NOTE — Progress Notes (Signed)
Primary Physician/Referring:  Lorenda Hatchet, FNP  Patient ID: [name], male    DOB: 22-Feb-1951, 70 y.o.   MRN: [id number]  Chief Complaint  Patient presents with  . Atrial Flutter  . Follow-up   HPI:    [name]  is a 70 y.o.  Caucasian male with CABG in [place] 02/18/2004: RIMA to LAD, LIMA to obtuse marginal, SVG to diagonal and SVG to PDA, who has diabetes mellitus, diabetic nephropathy with ESRD on HD at home, peripheral neuropathy and diabetic foot ulcer right, S/P Sim's amputation of the right foot and now has chronic ulcer, amputation of his left 2 toes due to diabetic foot ulcer in the past.  He still has a dry wound in his right leg but is healing well.  He was found to be in new onset atrial flutter on 03/08/2021.  He underwent atrial flutter ablation on 04/06/2020, he has noticed his energy level is improving.  He was recently found to have severe anemia, received iron transfusion.  He is tolerating Xarelto without any bleeding diathesis but has been advised to stop Xarelto after 3 weeks to 4 weeks of atrial flutter ablation by Dr. [name].  No chest pain, dyspnea stable, no leg edema.    Past Medical History:  Diagnosis Date  . Ambulates with cane    straight cane  . Cervical myelopathy ([place]) 02/06/2018  . Chronic kidney disease    dailysis M W F- home  . Complication of anesthesia   . Coronary artery disease   . Diabetes mellitus without complication ([place])    type2  . Diabetic foot ulcer ([place])   . Diabetic foot ulcer ([place])   . Diabetic neuropathy ([place]) 02/06/2018  . Gait abnormality 08/24/2018  . GERD (gastroesophageal reflux disease)     01/06/20- not current  . Gout   . History of kidney stones    passed stones  . Hypercholesteremia   . Hypertension   . Hypothyroidism   . Neuromuscular disorder (HCC)    neuropathy left leg and bilateral feet  . Neuropathy   . PONV (postoperative nausea and vomiting)   . Prostate cancer [facility])    Past Surgical History:   Procedure Laterality Date  . A-FLUTTER ABLATION N/A 04/06/2020   Procedure: A-FLUTTER ABLATION;  Surgeon: [name], MD;  Location: [facility];  Service: Cardiovascular;  Laterality: N/A;  . AMPUTATION Left 12/25/2013   Procedure: AMPUTATION RAY LEFT 5TH RAY;  Surgeon: [name], MD;  Location: [facility];  Service: Orthopedics;  Laterality: Left;  . AMPUTATION Right 12/15/2018   Procedure: AMPUTATION OF 4TH AND 5TH TOES RIGHT FOOT;  Surgeon: [name], MD;  Location: [facility];  Service: Orthopedics;  Laterality: Right;  . APPLICATION OF WOUND VAC Right 12/15/2018   Procedure: APPLICATION OF WOUND VAC;  Surgeon: [name], MD;  Location: [facility];  Service: Orthopedics;  Laterality: Right;  . AV FISTULA PLACEMENT Left 03/25/2019   Procedure: LEFT ARM ARTERIOVENOUS (AV) FISTULA CREATION;  Surgeon: [name], MD;  Location: [facility];  Service: Vascular;  Laterality: Left;  . BACK SURGERY    . BASCILIC VEIN TRANSPOSITION Left 05/20/2019   Procedure: SECOND STAGE LEFT BASCILIC VEIN TRANSPOSITION;  Surgeon: [name], MD;  Location: [facility];  Service: Vascular;  Laterality: Left;  . CARDIAC CATHETERIZATION  02/17/2014  . CHOLECYSTECTOMY    . COLONOSCOPY  2011   in [place], Normal  . CORONARY ARTERY BYPASS GRAFT  2008  . I & D EXTREMITY Right 12/15/2018   Procedure: DEBRIDEMENT RIGHT FOOT;  Surgeon: [name], MD;  Location: [facility];  Service: Orthopedics;  Laterality: Right;  . I & D EXTREMITY Right 08/27/2019   Procedure: PARTIAL CUBOID EXCISION RIGHT FOOT;  Surgeon: [name], MD;  Location: [facility];  Service: Orthopedics;  Laterality: Right;  . I & D EXTREMITY Right 01/07/2020   Procedure: RIGHT FOOT EXCISION INFECTED BONE;  Surgeon: [name], MD;  Location: [facility];  Service: Orthopedics;  Laterality: Right;  . NECK SURGERY     novemver 2019  . WISDOM TOOTH EXTRACTION     Social History   Tobacco Use  . Smoking status: Former Smoker    Years: 25.00     Types: Cigars    Start date: 04/08/1973    Quit date: 12/24/1988    Years since quitting: 31.3  . Smokeless tobacco: Never Used  . Tobacco comment: Cigars and Pipe   Substance Use Topics  . Alcohol use: No    ROS  Review of Systems  Constitutional: Positive for malaise/fatigue and weight loss. Negative for weight gain.  Cardiovascular: Negative for dyspnea on exertion, leg swelling and syncope.  Respiratory: Negative for hemoptysis.   Endocrine: Negative for cold intolerance.  Hematologic/Lymphatic: Does not bruise/bleed easily.  Skin:       Right foot ulcer (not examined) and right shin skin tear after a recent trauma.  Gastrointestinal: Negative for hematochezia and melena.  Neurological: Positive for light-headedness and paresthesias. Negative for headaches.   Objective  Blood pressure (!) 146/74, pulse 76, height 5\' 10"  (1.778 m), weight 190 lb (86.2 kg), SpO2 100 %.  Vitals with BMI 04/21/2020 04/21/2020 04/14/2020  Height - 5\' 10"  5\' 10"   Weight - 190 lbs 202 lbs  BMI - 45.40 98.11  Systolic 914 782 956  Diastolic 74 82 70  Pulse 76 75 78     Physical Exam Constitutional:      Comments: Ill looking  HENT:     Head: Atraumatic.  Eyes:     Conjunctiva/sclera: Conjunctivae normal.  Neck:     Thyroid: No thyromegaly.     Vascular: No JVD.  Cardiovascular:     Rate and Rhythm: Normal rate and regular rhythm.     Pulses:          Carotid pulses are 2+ on the right side and 2+ on the left side.      Femoral pulses are 2+ on the right side and 2+ on the left side.      Popliteal pulses are 2+ on the right side and 2+ on the left side.       Dorsalis pedis pulses are 1+ on the right side and 1+ on the left side.       Posterior tibial pulses are 1+ on the right side and 1+ on the left side.     Heart sounds: S1 normal and S2 normal. Murmur heard.   Holosystolic murmur is present with a grade of 2/6. Through out the precordium related to AV shunt left arm.       Comments:  Has ulcers on right foot, covered by tape and per wife and patient has no discharge and is almost healed. Thick rigid nails and onychomycosis (chronic).  No leg edema. Capillary refill < 3 Sec.  Left arm AV fistula for dialysis patent. Pulmonary:     Effort: Pulmonary effort is normal.  Breath sounds: Normal breath sounds.  Abdominal:     General: Bowel sounds are normal.     Palpations: Abdomen is soft.  Musculoskeletal:        General: Normal range of motion.     Cervical back: Neck supple.  Skin:    General: Skin is warm and dry.  Neurological:     Mental Status: He is alert.    Laboratory examination:   Recent Labs    01/07/20 1048 02/28/20 1522 03/24/20 1203  NA 141 141 138  K 4.2 4.1 3.9  CL 100 100 95*  CO2  --  30 31*  GLUCOSE 93 121* 134*  BUN 33* 36* 27  CREATININE 5.70* 5.97* 4.20*  CALCIUM  --  9.1 9.2  GFRNONAA  --   --  13*  GFRAA  --   --  15*   CrCl cannot be calculated (Patient's most recent lab result is older than the maximum 21 days allowed.).  CMP Latest Ref Rng & Units 03/24/2020 02/28/2020 01/07/2020  Glucose 65 - 99 mg/dL 134(H) 121(H) 93  BUN 8 - 27 mg/dL 27 36(H) 33(H)  Creatinine 0.76 - 1.27 mg/dL 4.20(H) 5.97(H) 5.70(H)  Sodium 134 - 144 mmol/L 138 141 141  Potassium 3.5 - 5.2 mmol/L 3.9 4.1 4.2  Chloride 96 - 106 mmol/L 95(L) 100 100  CO2 20 - 29 mmol/L 31(H) 30 -  Calcium 8.6 - 10.2 mg/dL 9.2 9.1 -  Total Protein 6.5 - 8.1 g/dL - - -  Total Bilirubin 0.3 - 1.2 mg/dL - - -  Alkaline Phos 38 - 126 U/L - - -  AST 15 - 41 U/L - - -  ALT 0 - 44 U/L - - -   CBC Latest Ref Rng & Units 03/24/2020 02/28/2020 01/07/2020  WBC 3.4 - 10.8 x10E3/uL 6.0 4.5 -  Hemoglobin 13.0 - 17.7 g/dL 8.3(L) 10.1(L) 10.5(L)  Hematocrit 37.5 - 51.0 % 25.7(L) 31.3(L) 31.0(L)  Platelets 150 - 450 x10E3/uL 108(L) 89(L) -   HEMOGLOBIN A1C Lab Results  Component Value Date   HGBA1C 6.3 (H) 12/14/2018   MPG 134.11 12/14/2018   External labs:   Labs  08/25/2019:  Total cholesterol 109, triglycerides 331, HDL 25, LDL 43.  Non-HDL cholesterol 84.  Labs 02/25/2020: A1c 4.7%.  TSH normal.  Labs 02/17/2020:  Hb 10.4/HCT 31.5, platelets 146, normal indicis.  Sodium 139, potassium 4.5, BUN 46, creatinine 5.88.  Total bilirubin 0.69.  Albumin 4.2.  ALT 14.  AST 19.  Medications and allergies   Allergies  Allergen Reactions  . Mushroom Extract Complex Nausea Only    Current Outpatient Medications on File Prior to Visit  Medication Sig Dispense Refill  . acetaminophen (TYLENOL) 500 MG tablet Take 1,500 mg by mouth every 8 (eight) hours as needed for moderate pain.    Marland Kitchen allopurinol (ZYLOPRIM) 100 MG tablet Take 200 mg by mouth daily.     . B Complex-C-Zn-Folic Acid (DIALYVITE 010 WITH ZINC) 0.8 MG TABS Take 1 tablet by mouth daily.    . benzonatate (TESSALON) 200 MG capsule Take 1 capsule (200 mg total) by mouth 3 (three) times daily as needed for cough. 90 capsule 2  . calcitRIOL (ROCALTROL) 0.5 MCG capsule Take 0.5 mcg by mouth daily.     . carvedilol (COREG) 12.5 MG tablet Take 12.5 mg by mouth in the morning and at bedtime.    . chlorhexidine (PERIDEX) 0.12 % solution 15 mLs by Mouth Rinse route daily.    Marland Kitchen  diclofenac Sodium (VOLTAREN) 1 % GEL Apply 1 application topically 4 (four) times daily as needed (pain).    . famotidine (PEPCID) 20 MG tablet Take 1 tablet (20 mg total) by mouth at bedtime. (Patient taking differently: Take 20 mg by mouth daily.) 30 tablet 3  . Lancets (ONETOUCH DELICA PLUS [id number]) MISC     . levothyroxine (SYNTHROID, LEVOTHROID) 112 MCG tablet Take 112 mcg by mouth daily before breakfast. BRAND NAME SYNTHROID    . Methoxy PEG-Epoetin Beta (MIRCERA IJ) Inject into the skin.    Marland Kitchen omeprazole (PRILOSEC) 40 MG capsule Take 1 capsule (40 mg total) by mouth daily. (Patient taking differently: Take 40 mg by mouth at bedtime as needed (acid reflux/indigestion.).) 30 capsule 3  . oxyCODONE-acetaminophen  (PERCOCET/ROXICET) 5-325 MG tablet Take 1 tablet by mouth every 4 (four) hours as needed. 30 tablet 0  . Polyethyl Glycol-Propyl Glycol (LUBRICANT EYE DROPS) 0.4-0.3 % SOLN Place 1-2 drops into both eyes 3 (three) times daily as needed (dry/irritated eyes.).    Marland Kitchen pregabalin (LYRICA) 150 MG capsule Take 150 mg by mouth in the morning and at bedtime.     . Rivaroxaban (XARELTO) 15 MG TABS tablet Take 1 tablet (15 mg total) by mouth daily with supper. 30 tablet 2  . rosuvastatin (CRESTOR) 10 MG tablet Take 10 mg by mouth daily.     . Semaglutide,0.25 or 0.5MG /DOS, (OZEMPIC, 0.25 OR 0.5 MG/DOSE,) 2 MG/1.5ML SOPN Inject 0.5 mg into the skin every Friday.    . sevelamer carbonate (RENVELA) 800 MG tablet Take 2,400 mg by mouth daily.    Marland Kitchen losartan (COZAAR) 50 MG tablet Take 50 mg by mouth daily. (Patient not taking: Reported on 04/21/2020)     No current facility-administered medications on file prior to visit.     Radiology:   CXR 02/25/2020: Persistent small bilateral pleural effusions, slightly increased on the left.  Cardiac Studies:   Lower extremity arterial duplex 05/12/2014:  No hemodynamically significant stenoses are identified in the bilateral lower extremity arterial system.This exam reveals normal perfusion of both the lower extremities with RABI 1.04 and LABI 1.09. There is mild diffuse disease involving the small vessels below the knee. Compared to the study done on 06/08/2013, no significant change. Impression: Lower extremity venous insufficiency study 12/25/2016: No venous insufficiency or DVT in the right lower extremity. Reflexes in the left greater saphenous vein beginning in the distal thigh and involving the entire length of the calf. No obvious varicosities.   ABI 12/16/2018: Right: Resting right ankle-brachial index is within normal range. No evidence of significant right lower extremity arterial disease. Left: Resting left ankle-brachial index indicates noncompressible left  lower extremity arteries.  Carotid artery duplex  05/07/2019: Stenosis in the bilateral internal carotid artery (16-49%), lower end of spectrum with heteregenous plaque.  Antegrade right vertebral artery flow. Antegrade left vertebral artery flow. Follow up in one year is appropriate if clinically indicated. Compared to 01/27/2018, no significant change.  Echocardiogram 03/17/2020: Mildly depressed LV systolic function with visual EF 40-45%. Left ventricle cavity is normal in size. Moderate concentric hypertrophy of the left ventricle. Left ventricle regional wall motion findings: Mid inferoseptal, Apical septal and Apical cap hypokinesis. Unable to evaluate diastolic function due to atrial fibrillation. Elevated LAP.  Left atrial cavity is severely dilated. Aortic valve sclerosis without stenosis. Mild (Grade I) mitral regurgitation. Mild tricuspid regurgitation. No evidence of pulmonary hypertension. RVSP measures 33 mmHg. Mild pulmonic regurgitation. IVC is dilated with blunted respiratory response. Compared to prior  study dated 06/08/2013: LVEF was 60-65% and now 40-45%, RWMA is new, LA is now severely dilated.   Lexiscan Tetrofosmin Stress Test 03/20/2020: Non-diagnostic ECG stress due to pharmacologic stress testing. Resting EKG demonstrated atrial flutter. Non-specific Twave abnormalities. Peak EKG revealed no significant ST-T change from baseline abnormality. Myocardial perfusion is normal. Mildly enlarged left ventricle. in both rest and stress. LV stress volume 170 ml. TID is normal. No stress lung uptake. Overall LV systolic function is low normal without regional wall motion  abnormalities. Stress LV EF: 51%. Compared to 05/28/2013, no significant change. LV dilatation is new.    EKG      Atrial flutter ablation 04/06/2020: 1. Isthmus-dependent right atrial flutter upon presentation.  2. Successful radiofrequency ablation of atrial flutter along the cavotricuspid isthmus  with complete bidirectional isthmus block achieved.  3. No inducible arrhythmias following ablation.  4. No early apparent complications.   EKG 03/24/2020: Atrial flutter with 4: 1 conduction, ventricular rate 66 bpm.  Normal axis.  Nonspecific T abnormality.   EKG 03/08/2020: Typical atrial flutter with 3: 1 conduction with ventricular rate of 67 bpm, normal axis.  EKG 05/03/2019: Sinus rhythm with borderline first-degree AV block, normal axis, incomplete right bundle branch block.  Poor R progression, probably normal variant.  No evidence of ischemia.No significant change from EKG 05/18/2018   Assessment     ICD-10-CM   1. Typical atrial flutter (HCC)  I48.3 EKG 12-Lead  2. Essential hypertension  I10   3. Coronary artery disease involving native coronary artery of native heart without angina pectoris  I25.10   4. ESRD (end stage renal disease) ([place])  N18.6   5. S/P ablation of atrial flutter  Z98.890    Z86.79   6. Acute systolic heart failure (HCC)  I50.21 PCV ECHOCARDIOGRAM COMPLETE   CHA2DS2-VASc Score is 4.  Yearly risk of stroke: 4.8% (A, HTN, DM, Vasc Dz).  Score of 1=0.6; 2=2.2; 3=3.2; 4=4.8; 5=7.2; 6=9.8; 7=>9.8) -(CHF; HTN; vasc disease DM,  Male = 1; Age <65 =0; 65-74 = 1,  >75 =2; stroke/embolism= 2).    No orders of the defined types were placed in this encounter.   Medications Discontinued During This Encounter  Medication Reason  . furosemide (LASIX) 40 MG tablet Patient Preference    Recommendations:   Asad Keeven  is a 70 y.o. Caucasian male with CABG in [place] 02/18/2004: RIMA to LAD, LIMA to obtuse marginal, SVG to diagonal and SVG to PDA, who has diabetes mellitus, diabetic nephropathy with ESRD on HD, peripheral neuropathy and diabetic foot ulcer right, S/P Sim's amputation of the right foot and now has chronic ulcer, amputation of his left 2 toes due to diabetic foot ulcer in the past.  He still has a dry wound in his right leg but is healing well.  He  underwent atrial flutter ablation on 04/06/2020, he is maintaining sinus rhythm and also still fatigue, I suspect he will feel much better, clinically he appears much more stable and much improved with regard to his fatigue and shortness of breath.  Blood pressure is elevated, he will be starting losartan 50 mg today.  He is being closely monitored by his nephrologist Dr. Madelon Lips.  I will certainly discuss with her to see whether she can uptitrated to 100 mg.  Will be careful with hypertension management in view of patient being on hemodialysis at home.  He will I will see him back in 3 months and I will repeat echocardiogram prior to his  next office visit for his heart failure.  Clinically today he is not in any acute decompensated heart failure.  He will discontinue Xarelto after total of 4 weeks after ablation.   Adrian Prows, MD, [facility] 04/21/2020, 10:23 AM Office: [phone] Pager: [phone]   CC: Madelon Lips, MD

## 2020-04-21 NOTE — Telephone Encounter (Signed)
Will place at front desk

## 2020-04-24 ENCOUNTER — Ambulatory Visit: Payer: Medicare Other | Admitting: Cardiology

## 2020-05-04 ENCOUNTER — Other Ambulatory Visit: Payer: Self-pay

## 2020-05-04 ENCOUNTER — Emergency Department (HOSPITAL_COMMUNITY): Payer: Medicare Other

## 2020-05-04 ENCOUNTER — Emergency Department (HOSPITAL_COMMUNITY)
Admission: EM | Admit: 2020-05-04 | Discharge: 2020-05-05 | Disposition: A | Discharge status: Home / Self Care | Payer: Medicare Other | Attending: Emergency Medicine | Admitting: Emergency Medicine

## 2020-05-04 DIAGNOSIS — R110 Nausea: Secondary | ICD-10-CM | POA: Diagnosis present

## 2020-05-04 DIAGNOSIS — N186 End stage renal disease: Secondary | ICD-10-CM | POA: Insufficient documentation

## 2020-05-04 DIAGNOSIS — E039 Hypothyroidism, unspecified: Secondary | ICD-10-CM | POA: Diagnosis not present

## 2020-05-04 DIAGNOSIS — E1140 Type 2 diabetes mellitus with diabetic neuropathy, unspecified: Secondary | ICD-10-CM | POA: Diagnosis not present

## 2020-05-04 DIAGNOSIS — Z87891 Personal history of nicotine dependence: Secondary | ICD-10-CM | POA: Insufficient documentation

## 2020-05-04 DIAGNOSIS — R17 Unspecified jaundice: Secondary | ICD-10-CM | POA: Diagnosis not present

## 2020-05-04 DIAGNOSIS — I2510 Atherosclerotic heart disease of native coronary artery without angina pectoris: Secondary | ICD-10-CM | POA: Insufficient documentation

## 2020-05-04 DIAGNOSIS — Z7901 Long term (current) use of anticoagulants: Secondary | ICD-10-CM | POA: Insufficient documentation

## 2020-05-04 DIAGNOSIS — Z951 Presence of aortocoronary bypass graft: Secondary | ICD-10-CM | POA: Insufficient documentation

## 2020-05-04 DIAGNOSIS — Z79899 Other long term (current) drug therapy: Secondary | ICD-10-CM | POA: Insufficient documentation

## 2020-05-04 DIAGNOSIS — Z8546 Personal history of malignant neoplasm of prostate: Secondary | ICD-10-CM | POA: Diagnosis not present

## 2020-05-04 DIAGNOSIS — I120 Hypertensive chronic kidney disease with stage 5 chronic kidney disease or end stage renal disease: Secondary | ICD-10-CM | POA: Diagnosis not present

## 2020-05-04 DIAGNOSIS — Z794 Long term (current) use of insulin: Secondary | ICD-10-CM | POA: Diagnosis not present

## 2020-05-04 DIAGNOSIS — Z992 Dependence on renal dialysis: Secondary | ICD-10-CM | POA: Diagnosis not present

## 2020-05-04 DIAGNOSIS — R630 Anorexia: Secondary | ICD-10-CM | POA: Insufficient documentation

## 2020-05-04 DIAGNOSIS — R5383 Other fatigue: Secondary | ICD-10-CM | POA: Insufficient documentation

## 2020-05-04 LAB — BASIC METABOLIC PANEL
Anion gap: 14 (ref 5–15)
BUN: 22 mg/dL (ref 8–23)
CO2: 28 mmol/L (ref 22–32)
Calcium: 9.3 mg/dL (ref 8.9–10.3)
Chloride: 95 mmol/L — ABNORMAL LOW (ref 98–111)
Creatinine, Ser: 4.63 mg/dL — ABNORMAL HIGH (ref 0.61–1.24)
GFR, Estimated: 13 mL/min — ABNORMAL LOW (ref >60)
Glucose, Bld: 118 mg/dL — ABNORMAL HIGH (ref 70–99)
Potassium: 3.3 mmol/L — ABNORMAL LOW (ref 3.5–5.1)
Sodium: 137 mmol/L (ref 135–145)

## 2020-05-04 LAB — CBC
HCT: 31.3 % — ABNORMAL LOW (ref 39.0–52.0)
Hemoglobin: 9.7 g/dL — ABNORMAL LOW (ref 13.0–17.0)
MCH: 27.7 pg (ref 26.0–34.0)
MCHC: 31 g/dL (ref 30.0–36.0)
MCV: 89.4 fL (ref 80.0–100.0)
Platelets: 154 10*3/uL (ref 150–400)
RBC: 3.5 MIL/uL — ABNORMAL LOW (ref 4.22–5.81)
RDW: 18.7 % — ABNORMAL HIGH (ref 11.5–15.5)
WBC: 4.4 10*3/uL (ref 4.0–10.5)
nRBC: 0 % (ref 0.0–0.2)

## 2020-05-04 LAB — TROPONIN I (HIGH SENSITIVITY)
Troponin I (High Sensitivity): 59 ng/L — ABNORMAL HIGH (ref <18)
Troponin I (High Sensitivity): 63 ng/L — ABNORMAL HIGH (ref <18)

## 2020-05-04 NOTE — ED Triage Notes (Signed)
Pt reports labs at [facility] today revealed an elevated troponin. Denies chest pain. Endorses nausea x 1 week.

## 2020-05-05 ENCOUNTER — Emergency Department (HOSPITAL_COMMUNITY): Payer: Medicare Other

## 2020-05-05 DIAGNOSIS — R110 Nausea: Secondary | ICD-10-CM | POA: Diagnosis not present

## 2020-05-05 LAB — URINALYSIS, ROUTINE W REFLEX MICROSCOPIC
Bilirubin Urine: NEGATIVE
Glucose, UA: NEGATIVE mg/dL
Hgb urine dipstick: NEGATIVE
Ketones, ur: NEGATIVE mg/dL
Leukocytes,Ua: NEGATIVE
Nitrite: NEGATIVE
Protein, ur: >=300 mg/dL — AB
Specific Gravity, Urine: 1.015 (ref 1.005–1.030)
pH: 7 (ref 5.0–8.0)

## 2020-05-05 LAB — HEPATIC FUNCTION PANEL
ALT: 16 U/L (ref 0–44)
AST: 21 U/L (ref 15–41)
Albumin: 3.7 g/dL (ref 3.5–5.0)
Alkaline Phosphatase: 81 U/L (ref 38–126)
Bilirubin, Direct: 0.4 mg/dL — ABNORMAL HIGH (ref 0.0–0.2)
Indirect Bilirubin: 1.4 mg/dL — ABNORMAL HIGH (ref 0.3–0.9)
Total Bilirubin: 1.8 mg/dL — ABNORMAL HIGH (ref 0.3–1.2)
Total Protein: 5.7 g/dL — ABNORMAL LOW (ref 6.5–8.1)

## 2020-05-05 LAB — ACETAMINOPHEN LEVEL: Acetaminophen (Tylenol), Serum: <10 ug/mL — ABNORMAL LOW (ref 10–30)

## 2020-05-05 LAB — AMMONIA: Ammonia: 21 umol/L (ref 9–35)

## 2020-05-05 LAB — LIPASE, BLOOD: Lipase: 37 U/L (ref 11–51)

## 2020-05-05 LAB — TROPONIN I (HIGH SENSITIVITY): Troponin I (High Sensitivity): 58 ng/L — ABNORMAL HIGH (ref <18)

## 2020-05-05 MED ORDER — SUCRALFATE 1 GM/10ML PO SUSP: 1.0000 g | Freq: Three times a day (TID) | ORAL | 0 refills | Status: DC

## 2020-05-05 MED ORDER — ONDANSETRON HCL 4 MG/2ML IJ SOLN
4.0000 mg | Freq: Once | INTRAMUSCULAR | Status: AC
  Administered 2020-05-05: 4 mg via INTRAVENOUS
  Filled 2020-05-05: qty 2

## 2020-05-05 MED ORDER — PROCHLORPERAZINE MALEATE 5 MG PO TABS: 5.0000 mg | ORAL_TABLET | Freq: Four times a day (QID) | ORAL | 0 refills | Status: DC | PRN

## 2020-05-05 MED ORDER — LOSARTAN POTASSIUM 50 MG PO TABS
50.0000 mg | ORAL_TABLET | Freq: Every day | ORAL | Status: DC
  Administered 2020-05-05: 50 mg via ORAL
  Filled 2020-05-05: qty 1

## 2020-05-05 MED ORDER — OXYCODONE HCL 5 MG PO TABS
5.0000 mg | ORAL_TABLET | Freq: Once | ORAL | Status: AC
Start: 2020-05-05 — End: 2020-05-05
  Administered 2020-05-05: 5 mg via ORAL
  Filled 2020-05-05: qty 1

## 2020-05-05 MED ORDER — PROCHLORPERAZINE EDISYLATE 10 MG/2ML IJ SOLN
5.0000 mg | Freq: Once | INTRAMUSCULAR | Status: AC
  Administered 2020-05-05: 5 mg via INTRAVENOUS
  Filled 2020-05-05: qty 2

## 2020-05-05 MED ORDER — PANTOPRAZOLE SODIUM 20 MG PO TBEC: 20.0000 mg | DELAYED_RELEASE_TABLET | Freq: Every day | ORAL | 0 refills | Status: DC

## 2020-05-05 MED ORDER — SODIUM CHLORIDE 0.9 % IV BOLUS
250.0000 mL | Freq: Once | INTRAVENOUS | Status: AC
  Administered 2020-05-05: 250 mL via INTRAVENOUS

## 2020-05-05 MED ORDER — IOHEXOL 300 MG/ML  SOLN
100.0000 mL | Freq: Once | INTRAMUSCULAR | Status: AC | PRN
  Administered 2020-05-05: 100 mL via INTRAVENOUS

## 2020-05-05 MED ORDER — FAMOTIDINE IN NACL 20-0.9 MG/50ML-% IV SOLN
20.0000 mg | Freq: Once | INTRAVENOUS | Status: AC
  Administered 2020-05-05: 20 mg via INTRAVENOUS
  Filled 2020-05-05: qty 50

## 2020-05-05 MED ORDER — CARVEDILOL 12.5 MG PO TABS
12.5000 mg | ORAL_TABLET | Freq: Two times a day (BID) | ORAL | Status: DC
  Administered 2020-05-05: 12.5 mg via ORAL
  Filled 2020-05-05: qty 1

## 2020-05-05 NOTE — ED Provider Notes (Signed)
[facility] EMERGENCY DEPARTMENT Provider Note   CSN: [id number] Arrival date & time: 05/04/20  1843     History Chief Complaint  Patient presents with  . Abnormal Lab    [name] is a 70 y.o. male with presents to the Emergency Department complaining of gradual, persistent, progressively worsening nausea onset 1 week ago. Associated symptoms include fatigue and loss of appetite.  Patient reports 1 episode of vomiting in the last week.  No diarrhea, melena or hematochezia.  Patient denies abdominal pain, chest pain, shortness of breath, dyspnea on exertion.  He reports he does hemodialysis at home and has had no complications with this.  He was last dialyzed yesterday.  Patient does have a history of diabetic foot ulcers for which he takes Tylenol.  Patient reports he takes 1500 mg for each dose anywhere between 3 and 4 times per day.  Denies alcohol usage.  Has a previous history of cholecystectomy.  Patient reports given his nausea he has not taken his home medications in the last several days.  Has no other complaints.  Patient presented to urgent care earlier today with complaints of nausea.  They found his troponin to be elevated at 53.  He has a history of CABG and is followed by Dr. Einar Gip.  Had recent ablation for A. fib at the end of December and has done well since then.  Patient with normal exam and follow-up on 04/21/2020.  No specific aggravating or alleviating factors for the nausea.  Patient does report it seems to be worse in the morning.    The history is provided by the patient and medical records. No language interpreter was used.       Past Medical History:  Diagnosis Date  . Ambulates with cane    straight cane  . Cervical myelopathy ([place]) 02/06/2018  . Chronic kidney disease    dailysis M W F- home  . Complication of anesthesia   . Coronary artery disease   . Diabetes mellitus without complication ([place])    type2  . Diabetic foot ulcer ([place])   .  Diabetic foot ulcer ([place])   . Diabetic neuropathy ([place]) 02/06/2018  . Gait abnormality 08/24/2018  . GERD (gastroesophageal reflux disease)     01/06/20- not current  . Gout   . History of kidney stones    passed stones  . Hypercholesteremia   . Hypertension   . Hypothyroidism   . Neuromuscular disorder (HCC)    neuropathy left leg and bilateral feet  . Neuropathy   . PONV (postoperative nausea and vomiting)   . Prostate cancer [facility])     Patient Active Problem List   Diagnosis Date Noted  . Typical atrial flutter ([place]) 03/24/2020  . Bilateral pleural effusion 02/24/2020  . Healthcare maintenance 01/28/2020  . Shortness of breath 01/28/2020  . History of partial ray amputation of fourth toe of right foot ([place]) 09/08/2019  . Ulcerated, foot, right, with necrosis of bone ([place])   . Chronic cough 06/25/2019  . Cutaneous abscess of right foot   . Subacute osteomyelitis of right foot ([place]) 12/14/2018  . AKI (acute kidney injury) ([place]) 12/14/2018  . CKD (chronic kidney disease) stage 4, GFR 15-29 ml/min (HCC) 12/14/2018  . Gait abnormality 08/24/2018  . Diabetic neuropathy ([place]) 02/06/2018  . Cervical myelopathy ([place]) 02/06/2018  . Onychomycosis 10/30/2017  . Other spondylosis with radiculopathy, cervical region 01/27/2017  . Midfoot ulcer, right, limited to breakdown of skin ([place]) 11/15/2016  .  Lateral epicondylitis, left elbow 08/12/2016  . Cellulitis of fifth toe of right foot 07/29/2016  . Prostate cancer ([place]) 06/19/2016  . Non-pressure chronic ulcer of other part of right foot limited to breakdown of skin ([place]) 04/04/2016  . Cellulitis of leg, right 12/24/2015  . Cellulitis of right lower extremity 12/24/2015  . CKD (chronic kidney disease), stage III ([place]) 12/25/2013  . Foot infection 12/24/2013  . Foot ulcer, left ([place]) 12/24/2013  . Diabetic foot ulcer ([place]) 09/14/2012  . Gout 09/14/2012  . HTN (hypertension) 09/14/2012  . Hypothyroidism 09/14/2012  . CAD (coronary  artery disease) 09/14/2012  . Diabetes mellitus ([name]) 09/14/2012  . Hypercholesteremia   . Neuropathy [facility])     Past Surgical History:  Procedure Laterality Date  . A-FLUTTER ABLATION N/A 04/06/2020   Procedure: A-FLUTTER ABLATION;  Surgeon: [name], MD;  Location: [facility] CV LAB;  Service: Cardiovascular;  Laterality: N/A;  . AMPUTATION Left 12/25/2013   Procedure: AMPUTATION RAY LEFT 5TH RAY;  Surgeon: [name], MD;  Location: [facility];  Service: Orthopedics;  Laterality: Left;  . AMPUTATION Right 12/15/2018   Procedure: AMPUTATION OF 4TH AND 5TH TOES RIGHT FOOT;  Surgeon: [name], MD;  Location: [facility];  Service: Orthopedics;  Laterality: Right;  . APPLICATION OF WOUND VAC Right 12/15/2018   Procedure: APPLICATION OF WOUND VAC;  Surgeon: [name], MD;  Location: [facility];  Service: Orthopedics;  Laterality: Right;  . AV FISTULA PLACEMENT Left 03/25/2019   Procedure: LEFT ARM ARTERIOVENOUS (AV) FISTULA CREATION;  Surgeon: [name], MD;  Location: [facility];  Service: Vascular;  Laterality: Left;  . BACK SURGERY    . BASCILIC VEIN TRANSPOSITION Left 05/20/2019   Procedure: SECOND STAGE LEFT BASCILIC VEIN TRANSPOSITION;  Surgeon: [name], MD;  Location: [facility];  Service: Vascular;  Laterality: Left;  . CARDIAC CATHETERIZATION  02/17/2014  . CHOLECYSTECTOMY    . COLONOSCOPY  2011   in [place], [place] GRAFT  2008  . I & D EXTREMITY Right 12/15/2018   Procedure: DEBRIDEMENT RIGHT FOOT;  Surgeon: [name], MD;  Location: [facility];  Service: Orthopedics;  Laterality: Right;  . I & D EXTREMITY Right 08/27/2019   Procedure: PARTIAL CUBOID EXCISION RIGHT FOOT;  Surgeon: [name], MD;  Location: [facility];  Service: Orthopedics;  Laterality: Right;  . I & D EXTREMITY Right 01/07/2020   Procedure: RIGHT FOOT EXCISION INFECTED BONE;  Surgeon: [name], MD;  Location: [facility];  Service: Orthopedics;  Laterality: Right;  . NECK  SURGERY     novemver 2019  . WISDOM TOOTH EXTRACTION         Family History  Problem Relation Age of Onset  . Diabetes Mellitus II Mother   . Diabetes Mellitus II Father   . CAD Father   . Cancer Father        prostate  . Diabetes Mellitus II Brother   . Diabetes Mellitus II Brother   . Stomach cancer Brother 45  . Colon cancer Neg Hx   . Colon polyps Neg Hx   . Esophageal cancer Neg Hx   . Rectal cancer Neg Hx     Social History   Tobacco Use  . Smoking status: Former Smoker    Years: 25.00    Types: Cigars    Start date: 04/08/1973    Quit date: 12/24/1988    Years since quitting: 31.3  .  Smokeless tobacco: Never Used  . Tobacco comment: Cigars and Pipe   Vaping Use  . Vaping Use: Never used  Substance Use Topics  . Alcohol use: No  . Drug use: No    Home Medications Prior to Admission medications   Medication Sig Start Date End Date Taking? Authorizing Provider  acetaminophen (TYLENOL) 500 MG tablet Take 1,500 mg by mouth every 8 (eight) hours as needed for moderate pain.    [provider]  allopurinol (ZYLOPRIM) 100 MG tablet Take 200 mg by mouth daily.  05/04/18   [provider]  B Complex-C-Zn-Folic Acid (DIALYVITE 194 WITH ZINC) 0.8 MG TABS Take 1 tablet by mouth daily. 12/23/19   [provider]  benzonatate (TESSALON) 200 MG capsule Take 1 capsule (200 mg total) by mouth 3 (three) times daily as needed for cough. 04/14/20   Tanda Rockers, MD  calcitRIOL (ROCALTROL) 0.5 MCG capsule Take 0.5 mcg by mouth daily.  08/11/19   [provider]  carvedilol (COREG) 12.5 MG tablet Take 12.5 mg by mouth in the morning and at bedtime.    [provider]  chlorhexidine (PERIDEX) 0.12 % solution 15 mLs by Mouth Rinse route daily. 12/07/19   [provider]  diclofenac Sodium (VOLTAREN) 1 % GEL Apply 1 application topically 4 (four) times daily as needed (pain). 05/04/19   [provider]  famotidine (PEPCID) 20 MG  tablet Take 1 tablet (20 mg total) by mouth at bedtime. Patient taking differently: Take 20 mg by mouth daily. 01/28/20   Lauraine Rinne, NP  Lancets (ONETOUCH DELICA PLUS [id number]) [place]  01/17/20   [provider]  levothyroxine (SYNTHROID, LEVOTHROID) 112 MCG tablet Take 112 mcg by mouth daily before breakfast. BRAND NAME SYNTHROID    [provider]  losartan (COZAAR) 50 MG tablet Take 50 mg by mouth daily. Patient not taking: Reported on 04/21/2020 04/20/20   [provider]  Methoxy PEG-Epoetin Beta (MIRCERA IJ) Inject into the skin. 12/23/19   [provider]  omeprazole (PRILOSEC) 40 MG capsule Take 1 capsule (40 mg total) by mouth at bedtime as needed (acid reflux/indigestion.). 04/21/20   Tanda Rockers, MD  oxyCODONE-acetaminophen (PERCOCET/ROXICET) 5-325 MG tablet Take 1 tablet by mouth every 4 (four) hours as needed. 01/07/20   [name], MD  Polyethyl Glycol-Propyl Glycol (LUBRICANT EYE DROPS) 0.4-0.3 % SOLN Place 1-2 drops into both eyes 3 (three) times daily as needed (dry/irritated eyes.).    [provider]  pregabalin (LYRICA) 150 MG capsule Take 150 mg by mouth in the morning and at bedtime.  04/19/19   [provider]  Rivaroxaban (XARELTO) 15 MG TABS tablet Take 1 tablet (15 mg total) by mouth daily with supper. 03/08/20   Adrian Prows, MD  rosuvastatin (CRESTOR) 10 MG tablet Take 10 mg by mouth daily.     [provider]  Semaglutide,0.25 or 0.5MG /DOS, (OZEMPIC, 0.25 OR 0.5 MG/DOSE,) 2 MG/1.5ML SOPN Inject 0.5 mg into the skin every Friday.    [provider]  sevelamer carbonate (RENVELA) 800 MG tablet Take 2,400 mg by mouth daily. 10/06/19   [provider]    Allergies    Mushroom extract complex  Review of Systems   Review of Systems  Constitutional: Positive for appetite change. Negative for diaphoresis, fatigue, fever and unexpected weight change.  HENT: Negative for mouth sores.   Eyes:  Negative for visual disturbance.  Respiratory: Negative for cough, chest tightness, shortness of breath and  wheezing.   Cardiovascular: Negative for chest pain.  Gastrointestinal: Positive for nausea. Negative for abdominal pain, constipation, diarrhea and vomiting.  Endocrine: Negative for polydipsia, polyphagia and polyuria.  Genitourinary: Negative for dysuria, frequency, hematuria and urgency.  Musculoskeletal: Negative for back pain and neck stiffness.  Skin: Negative for rash.  Allergic/Immunologic: Negative for immunocompromised state.  Neurological: Negative for syncope, light-headedness and headaches.  Hematological: Does not bruise/bleed easily.  Psychiatric/Behavioral: Negative for sleep disturbance. The patient is not nervous/anxious.     Physical Exam Updated Vital Signs BP (!) 188/93 (BP Location: Right Arm)   Pulse 86   Temp 98.3 F (36.8 C) (Oral)   Resp 16   Ht 5\' 10"  (1.778 m)   Wt 88.5 kg   SpO2 96%   BMI 27.98 kg/m   Physical Exam Vitals and nursing note reviewed.  Constitutional:      General: He is not in acute distress.    Appearance: He is not diaphoretic.  HENT:     Head: Normocephalic.     Mouth/Throat:     Mouth: Mucous membranes are dry.  Eyes:     General: Scleral icterus present.     Conjunctiva/sclera: Conjunctivae normal.  Cardiovascular:     Rate and Rhythm: Normal rate and regular rhythm.     Pulses: Normal pulses.          Radial pulses are 2+ on the right side and 2+ on the left side.  Pulmonary:     Effort: No tachypnea, accessory muscle usage, prolonged expiration, respiratory distress or retractions.     Breath sounds: No stridor.     Comments: Equal chest rise. No increased work of breathing. Abdominal:     General: Bowel sounds are normal. There is no distension.     Palpations: Abdomen is soft.     Tenderness: There is no abdominal tenderness. There is no guarding or rebound.  Musculoskeletal:     Cervical back: Normal  range of motion.     Comments: Moves all extremities equally and without difficulty.  Skin:    General: Skin is warm and dry.     Capillary Refill: Capillary refill takes less than 2 seconds.     Coloration: Skin is jaundiced.  Neurological:     Mental Status: He is alert.     GCS: GCS eye subscore is 4. GCS verbal subscore is 5. GCS motor subscore is 6.     Comments: Speech is clear and goal oriented.  Psychiatric:        Mood and Affect: Mood normal.     ED Results / Procedures / Treatments   Labs (all labs ordered are listed, but only abnormal results are displayed) Labs Reviewed  BASIC METABOLIC PANEL - Abnormal; Notable for the following components:      Result Value   Potassium 3.3 (*)    Chloride 95 (*)    Glucose, Bld 118 (*)    Creatinine, Ser 4.63 (*)    GFR, Estimated 13 (*)    All other components within normal limits  CBC - Abnormal; Notable for the following components:   RBC 3.50 (*)    Hemoglobin 9.7 (*)    HCT 31.3 (*)    RDW 18.7 (*)    All other components within normal limits  HEPATIC FUNCTION PANEL - Abnormal; Notable for the following components:   Total Protein 5.7 (*)    Total Bilirubin 1.8 (*)    Bilirubin, Direct 0.4 (*)  Indirect Bilirubin 1.4 (*)    All other components within normal limits  URINALYSIS, ROUTINE W REFLEX MICROSCOPIC - Abnormal; Notable for the following components:   Protein, ur >=300 (*)    Bacteria, UA RARE (*)    All other components within normal limits  ACETAMINOPHEN LEVEL - Abnormal; Notable for the following components:   Acetaminophen (Tylenol), Serum <10 (*)    All other components within normal limits  TROPONIN I (HIGH SENSITIVITY) - Abnormal; Notable for the following components:   Troponin I (High Sensitivity) 59 (*)    All other components within normal limits  TROPONIN I (HIGH SENSITIVITY) - Abnormal; Notable for the following components:   Troponin I (High Sensitivity) 63 (*)    All other components within  normal limits  TROPONIN I (HIGH SENSITIVITY) - Abnormal; Notable for the following components:   Troponin I (High Sensitivity) 58 (*)    All other components within normal limits  LIPASE, BLOOD  AMMONIA    EKG EKG Interpretation  Date/Time:  Friday May 05 2020 01:39:31 EST Ventricular Rate:  85 PR Interval:  204 QRS Duration: 84 QT Interval:  396 QTC Calculation: 471 R Axis:   81 Text Interpretation: Normal sinus rhythm T wave abnormality, consider inferior ischemia Abnormal ECG \ Interpretation limited secondary to artifact No significant change since last tracing Confirmed by Ripley Fraise [phone]) on 05/05/2020 2:53:20 AM   Radiology DG Chest 2 View  Result Date: 05/04/2020 CLINICAL DATA:  70 year old male with chest pain. EXAM: CHEST - 2 VIEW COMPARISON:  Chest radiograph dated 02/24/2020. FINDINGS: Small left pleural effusion with left lung base opacity similar to prior radiograph which may represent atelectasis or recurrent pneumonia. The right lung is clear. No pneumothorax. Stable cardiac silhouette. Median sternotomy wires and CABG vascular clips. No acute osseous pathology. Cervical ACDF. IMPRESSION: Small left pleural effusion and left lung base opacity similar to prior radiograph. Electronically Signed   By: Anner Crete M.D.   On: 05/04/2020 19:30    Procedures Procedures   Medications Ordered in ED Medications  carvedilol (COREG) tablet 12.5 mg (12.5 mg Oral Given 05/05/20 0439)  losartan (COZAAR) tablet 50 mg (50 mg Oral Given 05/05/20 0439)  ondansetron (ZOFRAN) injection 4 mg (4 mg Intravenous Given 05/05/20 0416)  sodium chloride 0.9 % bolus 250 mL (0 mLs Intravenous Stopped 05/05/20 0439)  oxyCODONE (Oxy IR/ROXICODONE) immediate release tablet 5 mg (5 mg Oral Given 05/05/20 0416)  iohexol (OMNIPAQUE) 300 MG/ML solution 100 mL (100 mLs Intravenous Contrast Given 05/05/20 6644)    ED Course  I have reviewed the triage vital signs and the nursing  notes.  Pertinent labs & imaging results that were available during my care of the patient were reviewed by me and considered in my medical decision making (see chart for details).  Clinical Course as of 05/05/20 0347  Fri May 05, 2020  0302 Creatinine(!): 4.63 baseline [HM]  0302 Hemoglobin(!): 9.7 baseline [HM]  0302 Troponin I (High Sensitivity)(!): 63 elevated [HM]  0302 DG Chest 2 View Pleural effusion as seen on previous [HM]    Clinical Course User Index [HM] Graig Hessling, Gwenlyn Perking   MDM Rules/Calculators/A&P                           Patient presents with 1 week of nausea and reports of elevated troponin from urgent care.  Patient denies chest pain, shortness of breath or dyspnea on exertion.    Considering acute  coronary syndrome, pancreatitis, choledocholithiasis, hepatitis, acetaminophen overdose, hepatic malignancy.  Additional history obtained:  Previous records obtained and reviewed.    Lab Tests:  I Ordered, reviewed, and interpreted labs, which included:  Urinalysis without evidence of urinary tract infection.  Basic metabolic panel with mild hypokalemia and elevated creatinine per baseline for patient and as expected for hemodialysis.  CBC with mild anemia again, expected as anemia of chronic disease.  Mildly elevated troponin however remained stable and EKG is without acute ischemia.  ECG:  I personally viewed and interpreted the ECG obtained.  EKG with inverted T waves, unchanged from baseline.  EKG also assessed by Dr. Christy Gentles.   Imaging Studies ordered:  I have placed order for imaging including CT scan. I personally reviewed and interpreted imaging. CT pending at shift change.   ED Course:  Patient labs appear to be baseline.  Hepatic function shows no elevation in AST or ALT and normal acetaminophen level.  Normal ammonia and normal lipase.  However patient does have increase in direct and indirect bilirubin.  Concerning for obstructive hepatic  process.  History of cholecystectomy.  Will obtain CT scan to ensure no duct obstruction.  The patient was discussed with and seen by Dr. Christy Gentles who agrees with the treatment plan.  At shift change care was transferred to [facility], PA-C who will follow pending studies, re-evaulate and determine disposition.      Final Clinical Impression(s) / ED Diagnoses Final diagnoses:  Jaundice  Nausea    Rx / DC Orders ED Discharge Orders    None       Jarl Sellitto, Gwenlyn Perking 05/05/20 1751    Ripley Fraise, MD 05/05/20 [phone]

## 2020-05-05 NOTE — Discharge Instructions (Addendum)
You were seen in the emergency department today for nausea, fatigue, decreased appetite, and abnormal blood work at urgent care.  Your heart enzymes stay fairly consistent here in the emergency department and your EKG looks similar to prior EKGs you have been done.  Your CT scan did not show any obvious masses or cancerous processes in your abdomen/pelvis on imaging.  Your liver looked a bit enhanced therefore we sent a hepatitis panel, we will call you if this is abnormal.  You did have some fluid at the bottom of the lung as well as around the abdomen.  Also have findings that could represent an ileus which is decreased movement in the intestines, but no obvious obstruction was seen.  We are sending you home with the following medications to help with your symptoms:  - Protonix- please take 1 tablet in the morning prior to any meals to help with stomach acidity/pain/irritation.  - Carafate- please take prior to each meal and prior to bedtime to help with stomach acidity/irritation.  - Compazine- please take every 6 hours as needed for nausea/vomiting.   We have prescribed you new medication(s) today. Discuss the medications prescribed today with your pharmacist as they can have adverse effects and interactions with your other medicines including over the counter and prescribed medications. Seek medical evaluation if you start to experience new or abnormal symptoms after taking one of these medicines, seek care immediately if you start to experience difficulty breathing, feeling of your throat closing, facial swelling, or rash as these could be indications of a more serious allergic reaction  Please follow attached diet guidelines.   Follow up with your primary care provider & GI doctor within 3 days for re-evaluation.  Return to the ER for new or worsening symptoms including but not limited to worsened pain, new pain, inability to keep fluids down, blood in vomit/stool, passing out, or any other  concerns.

## 2020-05-05 NOTE — ED Provider Notes (Signed)
06:45: Assumed care from [facility] @ change of shift pending CT A/P & Disposition.   Please see prior provider note for full H&P.  Briefly patient is a 70 year old male who presented with UC with 1 week of nausea, fatigue, and decreased appetite. Had abnormal troponin therefore was sent to the ED.   EKG  without significant change from prior.  Labs reviewed:  Troponins: elevated but flat in hemodialysis patient.  CBC: anemia improved from prior.  BMP: Fairly baseline for patient Hepatic function panel: LFTs WNL, bili elevated some.  UA: No UTI.  Lipase: WNL Ammonia: WNL  CXR: small L pleural effusion.   Plan @ change of shift pending CT A/P- if no acute significant abnormalities discharge home with close PCP follow up.   CT A/P: 1. No mass or metastatic disease identified in the abdomen or pelvis. 2. No evidence of acute biliary obstruction, but heterogeneous liver enhancement. Query Acute Hepatitis. Chronic calcific pancreatitis is stable since 2016. 3. New small volume ascites in the abdomen and pelvis, with new small to moderate layering left pleural effusion with atelectasis. 4. Borderline to mildly enlarged small bowel loops with no transition point. Favor ileus. 5. Bilateral renal atrophy since 2016. 6. Chronic benign right adrenal gland myelolipoma. 7. Aortic Atherosclerosis  Will send hepatitis panel.  No acute biliary obstruction.  On re-assessment mild temporary relief with zofran, but nausea returned, no emesis, will give compazine & send home with this.  Will discharge home with compazine & trial PPI & carafate with PCP/GI follow up.  I discussed results, treatment plan, need for follow-up, and return precautions with the patient. Provided opportunity for questions, patient confirmed understanding and is in agreement with plan.   CT findings and plan for discharge discussed with supervising physician Dr. Christy Gentles who has evaluated patient & is in agreement.      Leafy Kindle 05/05/20 0753    Ripley Fraise, MD 05/06/20 [phone]

## 2020-05-05 NOTE — ED Notes (Signed)
Up walking to bathroom, gait steady 

## 2020-05-07 NOTE — Progress Notes (Signed)
05/07/2020 [name] [id number] [birth year]   CHIEF COMPLAINT: Nausea   HISTORY OF PRESENT ILLNESS: [name] is a 71 year old male history of hypertension, coronary artery disease s/p 4 vessel CABG 02/2002, atrial flutter s/p ablation 04/06/2020, diabetes mellitus type 2, ESRD on hemodialysis Sun-Mon-Wed-Thur at home since 05/2019, anemia, peripheral neuropathy s/p partial amputation of the right foot and amputation of his left 5th toe, chronic cough, kidney stones, hypothyroidism, prostate cancer s/p radiation 2019. Past cholecytectomy 1988.    He presented to [facility] ED on 05/04/2020 with worsening nausea and fatigue.  Labs in the ED showed a potassium level of 3.3.  Creatinine 4.63 (base line Cr 4.2 - 5.97).  Hemoglobin 9.7 (Hg 8.3 on 12/17 with typical baseline Hg 10.1- 10.5).  Hematocrit 31.3.  MCV 89.4.  Platelet 154.  Alk phos 81.  AST 21.  ALT 16.  Total bili 1.8.  Direct bili 0.4.  Indirect bili 1.4.  Troponin levels mildly elevated 58 - 63 - 59.  A twelve-lead EKG showed inverted T waves which was unchanged from a prior EKG. Ammonia 21.  Acetaminophen level was normal.  The ED physician assessed he appeared somewhat jaundiced with scleral icterus.  An abdominal/pelvic CT scan showed heterogeneous liver enhancement query acute hepatitis without evidence of acute biliary obstruction and chronic calcific pancreatitis which was stable since prior imaging in 2016.  Small volume ascites in the abdomen pelvis was noted.  Borderline to mildly enlarged small bowel loops with no transition point suggestive of a possible ileus.  He remained hemodynamically stable and he was discharged home on Compazine, PPI and Carafate.  He presents to our office today for further follow-up regarding persistent nausea.  He complains of nausea and dry heaves which started 2 weeks ago. No vomiting but has intermittent dry heaves.  He received his 4th Covid vaccination 3 weeks ago.  No upper or lower  abdominal pain.  No dysphagia or heartburn. No fever.  He has intermittent chills.  He been constipated for the past month.  He is passing a very little amount of soft formed brown stool daily.  No rectal bleeding or melena.  His appetite has diminished.  His wife stated his food intake is significantly low with restricted fluid intake.  He has a history of anemia most likely due ESRD for which he receives IV iron once weekly for the past month as prescribed by his nephrologist.  His wife was trained to complete his hemodialysis at home every Sun-Mon-Wed and Thursday.  He  no longer receives heparin through the dialyzer.  He urinates small amounts of clear yellow urine twice daily.  He underwent 2 colonoscopies in his lifetime.  His most recent colonoscopy was done in [place] in 2011 which he reported was normal.  No history of colon polyps.  His newest medication was Losartan which was started approximately 2 weeks ago.  He underwent cardiac ablation due to having atrial flutter on 04/06/2020.  He was prescribed Xarelto by Dr. [name] which he took for 4 weeks then discontinued.  His last dose of Xarelto was approximately 1 week ago.  He denies having any chest pain, palpitations or shortness of breath.  He complains of fatigue.  No NSAID use.  He uses Tylenol as needed.  No alcohol use.  He reports losing 20 pounds over the past 1 to 2 months with a total weight loss of 50 pounds over the past year.  He is taking Compazine 5 mg  every 6 hours.  Zofran was ineffective.  He is taking Protonix 20 mg daily and famotidine 20 mg daily.  History of liver disease.  Brother with history of stomach cancer.  No family history of esophageal, colon or liver cancer.   CBC Latest Ref Rng & Units 05/04/2020 03/24/2020 02/28/2020  WBC 4.0 - 10.5 K/uL 4.4 6.0 4.5  Hemoglobin 13.0 - 17.0 g/dL 9.7(L) 8.3(L) 10.1(L)  Hematocrit 39.0 - 52.0 % 31.3(L) 25.7(L) 31.3(L)  Platelets 150 - 400 K/uL 154 108(L) 89(L)   CMP Latest Ref  Rng & Units 05/05/2020 05/04/2020 03/24/2020  Glucose 70 - 99 mg/dL - 118(H) 134(H)  BUN 8 - 23 mg/dL - 22 27  Creatinine 0.61 - 1.24 mg/dL - 4.63(H) 4.20(H)  Sodium 135 - 145 mmol/L - 137 138  Potassium 3.5 - 5.1 mmol/L - 3.3(L) 3.9  Chloride 98 - 111 mmol/L - 95(L) 95(L)  CO2 22 - 32 mmol/L - 28 31(H)  Calcium 8.9 - 10.3 mg/dL - 9.3 9.2  Total Protein 6.5 - 8.1 g/dL 5.7(L) - -  Total Bilirubin 0.3 - 1.2 mg/dL 1.8(H) - -  Alkaline Phos 38 - 126 U/L 81 - -  AST 15 - 41 U/L 21 - -  ALT 0 - 44 U/L 16 - -  Direct bili 0.5. Indirect bili 1.3.  Lipase 61. CK 81.  Acute Hepatitis panel 05/04/2020: Hepatitis A IgM and IgG antibody nonreactive.  Hepatitis B surface antigen nonreactive.  Hepatitis B surface antibody nonreactive.  Hepatitis B core antibody active.  Hepatitis C antibody nonreactive  Abdominal/pelvic CT with contrast 05/05/2020: 1. No mass or metastatic disease identified in the abdomen or pelvis. 2. No evidence of acute biliary obstruction, but heterogeneous liver enhancement. Query Acute Hepatitis. Chronic calcific pancreatitis is stable since 2016. 3. New small volume ascites in the abdomen and pelvis, with new small to moderate layering left pleural effusion with atelectasis. 4. Borderline to mildly enlarged small bowel loops with no transition point. Favor ileus. 5. Bilateral renal atrophy since 2016. 6. Chronic benign right adrenal gland myelolipoma. 7. Aortic Atherosclerosis   Carotid artery duplex 05/07/2019: Stenosis in the bilateral internal carotid artery (16-49%), lower end of spectrum with heteregenous plaque.  Antegrade right vertebral artery flow. Antegrade left vertebral artery flow. Follow up in one year is appropriate if clinically indicated. Compared to 01/27/2018, no significant change.  Echocardiogram 03/17/2020: Mildly depressed LV systolic function with visual EF 40-45%. Left ventricle cavity is normal in size. Moderate concentric hypertrophy of  the left ventricle. Left ventricle regional wall motion findings: Mid inferoseptal, Apical septal and Apical cap hypokinesis. Unable to evaluate diastolic function due to atrial fibrillation. Elevated LAP.  Left atrial cavity is severely dilated. Aortic valve sclerosis without stenosis. Mild (Grade I) mitral regurgitation. Mild tricuspid regurgitation. No evidence of pulmonary hypertension. RVSP measures 33 mmHg. Mild pulmonic regurgitation. IVC is dilated with blunted respiratory response. Compared to prior study dated 06/08/2013: LVEF was 60-65% and now 40-45%, RWMA is new, LA is now severely dilated.   Lexiscan Tetrofosmin Stress Test12/13/2021: Non-diagnostic ECG stress due to pharmacologic stress testing. Resting EKG demonstrated atrial flutter. Non-specific Twave abnormalities. Peak EKG revealed no significant ST-T change from baseline abnormality. Myocardial perfusion is normal. Mildly enlarged left ventricle. in both rest and stress. LV stress volume 170 ml. TID is normal. No stress lung uptake. Overall LV systolic function is low normal without regional wall motion abnormalities. Stress LV EF: 51%. Compared to 05/28/2013, no significant change. LV dilatation is new.  Past Medical History:  Diagnosis Date  . Ambulates with cane    straight cane  . Cervical myelopathy ([place]) 02/06/2018  . Chronic kidney disease    dailysis M W F- home  . Complication of anesthesia   . Coronary artery disease   . Diabetes mellitus without complication ([place])    type2  . Diabetic foot ulcer ([place])   . Diabetic foot ulcer ([place])   . Diabetic neuropathy ([place]) 02/06/2018  . Gait abnormality 08/24/2018  . GERD (gastroesophageal reflux disease)     01/06/20- not current  . Gout   . History of kidney stones    passed stones  . Hypercholesteremia   . Hypertension   . Hypothyroidism   . Neuromuscular disorder ([facility])    neuropathy left leg and bilateral feet  . Neuropathy   . PONV (postoperative nausea  and vomiting)   . Prostate cancer [facility])    Past Surgical History:  Procedure Laterality Date  . A-FLUTTER ABLATION N/A 04/06/2020   Procedure: A-FLUTTER ABLATION;  Surgeon: [name], MD;  Location: [place] [facility];  Service: Cardiovascular;  Laterality: N/A;  . AMPUTATION Left 12/25/2013   Procedure: AMPUTATION RAY LEFT 5TH RAY;  Surgeon: [name], MD;  Location: [facility];  Service: Orthopedics;  Laterality: Left;  . AMPUTATION Right 12/15/2018   Procedure: AMPUTATION OF 4TH AND 5TH TOES RIGHT FOOT;  Surgeon: [name], MD;  Location: [place];  Service: Orthopedics;  Laterality: Right;  . APPLICATION OF WOUND VAC Right 12/15/2018   Procedure: APPLICATION OF WOUND VAC;  Surgeon: [name], MD;  Location: [place];  Service: Orthopedics;  Laterality: Right;  . AV FISTULA PLACEMENT Left 03/25/2019   Procedure: LEFT ARM ARTERIOVENOUS (AV) FISTULA CREATION;  Surgeon: [name], MD;  Location: [place];  Service: Vascular;  Laterality: Left;  . BACK SURGERY    . BASCILIC VEIN TRANSPOSITION Left 05/20/2019   Procedure: SECOND STAGE LEFT BASCILIC VEIN TRANSPOSITION;  Surgeon: [name], MD;  Location: [place];  Service: Vascular;  Laterality: Left;  . CARDIAC CATHETERIZATION  02/17/2014  . CHOLECYSTECTOMY    . COLONOSCOPY  2011   in [place], [place] GRAFT  2008  . I & D EXTREMITY Right 12/15/2018   Procedure: DEBRIDEMENT RIGHT FOOT;  Surgeon: [name], MD;  Location: [place];  Service: Orthopedics;  Laterality: Right;  . I & D EXTREMITY Right 08/27/2019   Procedure: PARTIAL CUBOID EXCISION RIGHT FOOT;  Surgeon: [name], MD;  Location: [place];  Service: Orthopedics;  Laterality: Right;  . I & D EXTREMITY Right 01/07/2020   Procedure: RIGHT FOOT EXCISION INFECTED BONE;  Surgeon: [name], MD;  Location: [place];  Service: Orthopedics;  Laterality: Right;  . NECK SURGERY     novemver 2019  . WISDOM TOOTH EXTRACTION     Social History:  He is married.  He has 2 sons.  Past smoker.  No alcohol use.  No drug use.  Family History: Mother died age 57 type I DM, MI.  Father with DM, prostate cancer, heart disease and bypass surgery. Brother with stomach cancer, kidney transplant. Brother in process kidney transplant DM.  Allergies  Allergen Reactions  . Mushroom Extract Complex Nausea Only      Outpatient Encounter Medications as of 05/08/2020  Medication Sig  . acetaminophen (TYLENOL) 500 MG tablet Take 1,500 mg by mouth every 8 (eight) hours as needed for  moderate pain.  Marland Kitchen allopurinol (ZYLOPRIM) 100 MG tablet Take 200 mg by mouth daily.   . B Complex-C-Zn-Folic Acid (DIALYVITE 629 WITH ZINC) 0.8 MG TABS Take 1 tablet by mouth daily.  . benzonatate (TESSALON) 200 MG capsule Take 1 capsule (200 mg total) by mouth 3 (three) times daily as needed for cough.  . calcitRIOL (ROCALTROL) 0.5 MCG capsule Take 0.5 mcg by mouth daily.   . carvedilol (COREG) 12.5 MG tablet Take 12.5 mg by mouth in the morning and at bedtime.  . chlorhexidine (PERIDEX) 0.12 % solution 15 mLs by Mouth Rinse route daily.  . diclofenac Sodium (VOLTAREN) 1 % GEL Apply 1 application topically 4 (four) times daily as needed (pain).  . famotidine (PEPCID) 20 MG tablet Take 1 tablet (20 mg total) by mouth at bedtime. (Patient taking differently: Take 20 mg by mouth daily.)  . Lancets (ONETOUCH DELICA PLUS [id number]) MISC   . levothyroxine (SYNTHROID, LEVOTHROID) 112 MCG tablet Take 112 mcg by mouth daily before breakfast. BRAND NAME SYNTHROID  . losartan (COZAAR) 50 MG tablet Take 50 mg by mouth daily. (Patient not taking: Reported on 04/21/2020)  . Methoxy PEG-Epoetin Beta (MIRCERA IJ) Inject into the skin.  Marland Kitchen omeprazole (PRILOSEC) 40 MG capsule Take 1 capsule (40 mg total) by mouth at bedtime as needed (acid reflux/indigestion.).  Marland Kitchen oxyCODONE-acetaminophen (PERCOCET/ROXICET) 5-325 MG tablet Take 1 tablet by mouth every 4 (four) hours as needed.  . pantoprazole  (PROTONIX) 20 MG tablet Take 1 tablet (20 mg total) by mouth daily.  Vladimir Faster Glycol-Propyl Glycol (LUBRICANT EYE DROPS) 0.4-0.3 % SOLN Place 1-2 drops into both eyes 3 (three) times daily as needed (dry/irritated eyes.).  Marland Kitchen pregabalin (LYRICA) 150 MG capsule Take 150 mg by mouth in the morning and at bedtime.   . prochlorperazine (COMPAZINE) 5 MG tablet Take 1 tablet (5 mg total) by mouth every 6 (six) hours as needed for nausea or vomiting.  . Rivaroxaban (XARELTO) 15 MG TABS tablet Take 1 tablet (15 mg total) by mouth daily with supper.  . rosuvastatin (CRESTOR) 10 MG tablet Take 10 mg by mouth daily.   . Semaglutide,0.25 or 0.5MG/DOS, (OZEMPIC, 0.25 OR 0.5 MG/DOSE,) 2 MG/1.5ML SOPN Inject 0.5 mg into the skin every Friday.  . sevelamer carbonate (RENVELA) 800 MG tablet Take 2,400 mg by mouth daily.  . sucralfate (CARAFATE) 1 GM/10ML suspension Take 10 mLs (1 g total) by mouth 4 (four) times daily -  with meals and at bedtime.   No facility-administered encounter medications on file as of 05/08/2020.    REVIEW OF SYSTEMS:.  See HPI, all other systems reviewed and are negative  PHYSICAL EXAM: BP (!) 160/70 (BP Location: Left Arm, Patient Position: Sitting, Cuff Size: Normal)   Pulse 78   Ht '5\' 10"'  (1.778 m)   Wt 193 lb (87.5 kg)   BMI 27.69 kg/m   General: 70 year old male fatigued appearing in no acute distress. Head: Normocephalic and atraumatic. Eyes:  Trace scleral icterus. Conjunctive pink. Ears: Normal auditory acuity. Mouth: Dentition intact. No ulcers or lesions.  Neck: Supple, no lymphadenopathy or thyromegaly.  Lungs: Clear bilaterally to auscultation without wheezes, crackles or rhonchi. Heart: Regular rate and rhythm. No murmur, rub or gallop appreciated.  Mediastinal scar intact. Abdomen: Soft, nontender, non distended. No masses. No hepatosplenomegaly. Normoactive bowel sounds x 4 quadrants.  Rectal: Soft brown stool in the rectal vault guaiac  negative. Musculoskeletal: Symmetrical with no gross deformities. Skin: No frank jaundice but his skin color  yellow/tan hue is more consistent with chronic renal disease.  Warm and dry. No rash or lesions on visible extremities. Extremities: No edema. LUE fistula with positive bruit and thrill. Neurological: Alert oriented x 4, no focal deficits.  Psychological:  Alert and cooperative. Normal mood and affect.  ASSESSMENT AND PLAN:  9.  70 year old male with a complex medical history presents for further evaluation regarding nausea x 2 weeks.  I suspect his nausea is most likely due to his end-stage renal disease on home dialysis 4 days weekly may also be related to his weekly IV iron infusions x 3.  -CBC, BMP, hepatic panel -Schedule EGD to rule out UGI etiology for his nausea after the above lab results reviewed and after I  review case with Dr. Henrene Pastor. -Continue Pantoprazole 20 mg daily Famotidine 20 mg daily for now -Continue Compazine 65m po Q 6 hrs   2.  Chronic anemia secondary to ESRD on HD.  Recently received IV iron Q week x 3. No overt GI bleeding. Soft formed brown stool on exam today guaiac negative. -Continue management with nephrologist  3 .Atrial flutter s/p ablation 03/2020 on Xarelto x 4 weeks then discontinued one week ago.  CHA2DS2-VASc Score is 4.  4. Coronary artery disease s/p CABG 2003  5. Elevated T. Bilirubin level Indirect > direct indicating hemolysis most likely from frequent hemodialysis verses Gilbert's Syndrome. CTAP without contrast 05/05/2020 without evidence of biliary obstruction or mass but shows but heterogeneous liver enhancement, query acute hepatitis. Chronic calcific pancreatitis is stable since 2016.Acute hepatitis panel was negative.  -Hepatic panel is ordered above  6.  Weight loss, patient reports losing 50 lbs total over the past year with the last 20lbs loss over the past few months. -Eventual EGD to rule out upper GI malignancy -No plans for a  diagnostic colonoscopy at this time as patient would not tolerate a bowel prep. CTAP 05/05/2020 without contrast without evidence of colonic mass. Colonoscopy in 2011 reported as normal. -Follow up with nephrologist for nutritional consult   7. DM II with peripheral neuropathy and peripheral vascular disease.      CC:  SLorenda Hatchet FNP

## 2020-05-08 ENCOUNTER — Encounter: Payer: Self-pay | Admitting: Nurse Practitioner

## 2020-05-08 ENCOUNTER — Encounter: Payer: Self-pay | Admitting: Internal Medicine

## 2020-05-08 ENCOUNTER — Ambulatory Visit (INDEPENDENT_AMBULATORY_CARE_PROVIDER_SITE_OTHER): Payer: Medicare Other | Admitting: Nurse Practitioner

## 2020-05-08 ENCOUNTER — Other Ambulatory Visit: Payer: Self-pay

## 2020-05-08 ENCOUNTER — Telehealth: Payer: Self-pay

## 2020-05-08 ENCOUNTER — Other Ambulatory Visit (INDEPENDENT_AMBULATORY_CARE_PROVIDER_SITE_OTHER): Payer: Medicare Other

## 2020-05-08 VITALS — BP 160/70 | HR 78 | Ht 70.0 in | Wt 193.0 lb

## 2020-05-08 DIAGNOSIS — D649 Anemia, unspecified: Secondary | ICD-10-CM

## 2020-05-08 DIAGNOSIS — R110 Nausea: Secondary | ICD-10-CM | POA: Diagnosis not present

## 2020-05-08 LAB — BASIC METABOLIC PANEL
BUN: 15 mg/dL (ref 6–23)
CO2: 33 mEq/L — ABNORMAL HIGH (ref 19–32)
Calcium: 9.7 mg/dL (ref 8.4–10.5)
Chloride: 94 mEq/L — ABNORMAL LOW (ref 96–112)
Creatinine, Ser: 4.52 mg/dL (ref 0.40–1.50)
GFR: 12.46 mL/min — CL (ref >60.00)
Glucose, Bld: 151 mg/dL — ABNORMAL HIGH (ref 70–99)
Potassium: 3.3 mEq/L — ABNORMAL LOW (ref 3.5–5.1)
Sodium: 135 mEq/L (ref 135–145)

## 2020-05-08 LAB — CBC WITH DIFFERENTIAL/PLATELET
Basophils Absolute: 0.1 10*3/uL (ref 0.0–0.1)
Basophils Relative: 1.2 % (ref 0.0–3.0)
Eosinophils Absolute: 0 10*3/uL (ref 0.0–0.7)
Eosinophils Relative: 0.8 % (ref 0.0–5.0)
HCT: 32.3 % — ABNORMAL LOW (ref 39.0–52.0)
Hemoglobin: 10.5 g/dL — ABNORMAL LOW (ref 13.0–17.0)
Lymphocytes Relative: 5.2 % — ABNORMAL LOW (ref 12.0–46.0)
Lymphs Abs: 0.3 10*3/uL — ABNORMAL LOW (ref 0.7–4.0)
MCHC: 32.4 g/dL (ref 30.0–36.0)
MCV: 83.7 fl (ref 78.0–100.0)
Monocytes Absolute: 0.4 10*3/uL (ref 0.1–1.0)
Monocytes Relative: 8.7 % (ref 3.0–12.0)
Neutro Abs: 4.3 10*3/uL (ref 1.4–7.7)
Neutrophils Relative %: 82.7 % — ABNORMAL HIGH (ref 43.0–77.0)
Platelets: 144 10*3/uL — ABNORMAL LOW (ref 150.0–400.0)
RBC: 3.86 Mil/uL — ABNORMAL LOW (ref 4.22–5.81)
RDW: 20.3 % — ABNORMAL HIGH (ref 11.5–15.5)
WBC: 5.1 10*3/uL (ref 4.0–10.5)

## 2020-05-08 LAB — HEPATIC FUNCTION PANEL
ALT: 16 U/L (ref 0–53)
AST: 22 U/L (ref 0–37)
Albumin: 4.4 g/dL (ref 3.5–5.2)
Alkaline Phosphatase: 82 U/L (ref 39–117)
Bilirubin, Direct: 0.6 mg/dL — ABNORMAL HIGH (ref 0.0–0.3)
Total Bilirubin: 2.4 mg/dL — ABNORMAL HIGH (ref 0.2–1.2)
Total Protein: 6.2 g/dL (ref 6.0–8.3)

## 2020-05-08 NOTE — Telephone Encounter (Signed)
TC from lab for stat critical result.  Creatine 4.52  GFR 12.46.

## 2020-05-08 NOTE — Progress Notes (Signed)
Called by patient's son and wife in background as well. Still very nauseated compazine doesn't help and previously ondansetron did not help. I reviewed the notes from Rigby Swamy Best and Dr Henrene Pastor today. I think the family was expecting some call about a plan today though I told them it could take a day or two to put that together. He is refusing to eat and they are very worried. I told them we would regroup with them tomor row that I did not have any significant change as far as medication to fix that overnight. I suggested that he sip on fluids as much as possible to try to avoid dehydration. Apparently he is refusing dialysis as well.

## 2020-05-08 NOTE — Progress Notes (Signed)
Assessment and plan reviewed.  I suspect he has medical nausea related to his multiple medical problems versus medication related.  Could have diabetic gastroparesis.  I would increase his PPI to twice daily.  He could consider running Zofran for chronic nausea, if no contraindications.  Okay to set up for EGD in the [place].  If I cannot accommodate it a reasonable amount of time, set up with another provider.  Thanks

## 2020-05-08 NOTE — Telephone Encounter (Signed)
Noted, patient has ESRD on HD 

## 2020-05-08 NOTE — Patient Instructions (Addendum)
If you are age 71 or older, your body mass index should be between 23-30. Your Body mass index is 27.69 kg/m. If this is out of the aforementioned range listed, please consider follow up with your Primary Care Provider.  LABS:   Your provider has requested that you go to the basement level for lab work before leaving today. Press "B" on the elevator. The lab is located at the first door on the left as you exit the elevator.  HEALTHCARE LAWS AND MY CHART RESULTS: Due to recent changes in healthcare laws, you may see the results of your imaging and laboratory studies on MyChart before your provider has had a chance to review them.   We understand that in some cases there may be results that are confusing or concerning to you. Not all laboratory results come back in the same time frame and the provider may be waiting for multiple results in order to interpret others.  Please give [place] 48 hours in order for your provider to thoroughly review all the results before contacting the office for clarification of your results.   Further recommendation to be determined after we get your lab results.  It was great seeing you today!  Thank you for entrusting me with your care and choosing [facility].  Noralyn Pick, CRNP

## 2020-05-09 ENCOUNTER — Other Ambulatory Visit: Payer: Self-pay | Admitting: Nurse Practitioner

## 2020-05-09 ENCOUNTER — Telehealth: Payer: Self-pay | Admitting: Nurse Practitioner

## 2020-05-09 ENCOUNTER — Other Ambulatory Visit: Payer: Self-pay | Admitting: Physician Assistant

## 2020-05-09 ENCOUNTER — Telehealth: Payer: Self-pay | Admitting: Orthopedic Surgery

## 2020-05-09 DIAGNOSIS — R110 Nausea: Secondary | ICD-10-CM

## 2020-05-09 DIAGNOSIS — D649 Anemia, unspecified: Secondary | ICD-10-CM

## 2020-05-09 DIAGNOSIS — R053 Chronic cough: Secondary | ICD-10-CM

## 2020-05-09 LAB — IRON,TIBC AND FERRITIN PANEL
%SAT: 19 % (calc) — ABNORMAL LOW (ref 20–48)
Ferritin: 707 ng/mL — ABNORMAL HIGH (ref 24–380)
Iron: 56 ug/dL (ref 50–180)
TIBC: 296 mcg/dL (calc) (ref 250–425)

## 2020-05-09 MED ORDER — PREGABALIN 150 MG PO CAPS: 150.0000 mg | ORAL_CAPSULE | Freq: Two times a day (BID) | ORAL | 0 refills | Status: DC

## 2020-05-09 NOTE — Telephone Encounter (Signed)
Dr. Henrene Pastor, [facility] patient scheduled EGD with you on 2/3.

## 2020-05-09 NOTE — Telephone Encounter (Signed)
[name], please contact the patient and schedule him for an EGD with Dr. Henrene Pastor or any physician who has a earliest available spot, hopefully this week.  Refer to office visit yesterday for EGD orders.  I called the patient this morning and reviewed his laboratory studies and recommendations for an expedited EGD.  Refer to my chart message, patient to contact his urologist Dr. Hollie Salk as she needs to be involved in the management of his nausea in the setting of decreased fluid and oral food intake with dialysis 4 days weekly.  Please let me know which physician and what date the patient is scheduled for his EGD.  Thanks

## 2020-05-09 NOTE — Telephone Encounter (Signed)
Can you please refill this pt's lyrica? Pharmacy is listed below.

## 2020-05-09 NOTE — Telephone Encounter (Signed)
done

## 2020-05-09 NOTE — Telephone Encounter (Signed)
Patient's wife Remo Lipps called requesting on behalf of patient for Dr. [name] to send in script for pregablin/lyrica. Please send medication to [facility] on [address]. Phone number is [phone] or [phone]. Please call patient when it has been called in.

## 2020-05-09 NOTE — Telephone Encounter (Signed)
Spoke to patient this afternoon to discuss dates to schedule for his EGD . Patient is scheduled for EGD on 05/11/20 in [place] with Dr Henrene Pastor at 2 pm. We went over the instructions on the phone as well as reviewing them in [place]. The consent form was emailed for him to sign and bring back with him on his procedure day. All questions answered. Patient voiced understanding. He was very appreciative to get him in so quickly.

## 2020-05-10 ENCOUNTER — Encounter: Payer: Self-pay | Admitting: Internal Medicine

## 2020-05-10 ENCOUNTER — Ambulatory Visit (INDEPENDENT_AMBULATORY_CARE_PROVIDER_SITE_OTHER): Payer: Medicare Other | Admitting: Internal Medicine

## 2020-05-10 ENCOUNTER — Other Ambulatory Visit: Payer: Self-pay

## 2020-05-10 VITALS — BP 164/80 | HR 77 | Ht 70.0 in | Wt 189.0 lb

## 2020-05-10 DIAGNOSIS — I483 Typical atrial flutter: Secondary | ICD-10-CM

## 2020-05-10 DIAGNOSIS — I2510 Atherosclerotic heart disease of native coronary artery without angina pectoris: Secondary | ICD-10-CM

## 2020-05-10 NOTE — Progress Notes (Signed)
HPI [name] returns today for followup of his atrial flutter. He is a pleasant 70 yo man with atrial flutter who underwent EP study and catheter ablation several weeks ago. He did well for 2 weeks then developed GI problems. He has had difficulty eating. This has started to get better slowly. No palpitations. His cough has resolved since his ablation and he has more energy and less dyspnea.  Allergies  Allergen Reactions  . Mushroom Extract Complex Nausea Only     Current Outpatient Medications  Medication Sig Dispense Refill  . acetaminophen (TYLENOL) 500 MG tablet Take 1,500 mg by mouth every 8 (eight) hours as needed for moderate pain.    Marland Kitchen allopurinol (ZYLOPRIM) 100 MG tablet Take 200 mg by mouth daily.     . B Complex-C-Zn-Folic Acid (DIALYVITE 259 WITH ZINC) 0.8 MG TABS Take 1 tablet by mouth daily.    . calcitRIOL (ROCALTROL) 0.5 MCG capsule Take 0.5 mcg by mouth daily.     . carvedilol (COREG) 12.5 MG tablet Take 12.5 mg by mouth in the morning and at bedtime.    . chlorhexidine (PERIDEX) 0.12 % solution 15 mLs by Mouth Rinse route daily.    . diclofenac Sodium (VOLTAREN) 1 % GEL Apply 1 application topically 4 (four) times daily as needed (pain).    . famotidine (PEPCID) 20 MG tablet Take 1 tablet (20 mg total) by mouth at bedtime. 30 tablet 3  . Lancets (ONETOUCH DELICA PLUS [id number]) MISC     . levothyroxine (SYNTHROID, LEVOTHROID) 112 MCG tablet Take 112 mcg by mouth daily before breakfast. BRAND NAME SYNTHROID    . losartan (COZAAR) 50 MG tablet Take 50 mg by mouth daily.    . Methoxy PEG-Epoetin Beta (MIRCERA IJ) Inject into the skin.    Marland Kitchen oxyCODONE-acetaminophen (PERCOCET/ROXICET) 5-325 MG tablet Take 1 tablet by mouth every 4 (four) hours as needed. 30 tablet 0  . pantoprazole (PROTONIX) 20 MG tablet Take 1 tablet (20 mg total) by mouth daily. 30 tablet 0  . Polyethyl Glycol-Propyl Glycol (LUBRICANT EYE DROPS) 0.4-0.3 % SOLN Place 1-2 drops into both eyes 3  (three) times daily as needed (dry/irritated eyes.).    Marland Kitchen pregabalin (LYRICA) 150 MG capsule Take 1 capsule (150 mg total) by mouth in the morning and at bedtime. 60 capsule 0  . prochlorperazine (COMPAZINE) 5 MG tablet Take 1 tablet (5 mg total) by mouth every 6 (six) hours as needed for nausea or vomiting. 15 tablet 0  . rosuvastatin (CRESTOR) 10 MG tablet Take 10 mg by mouth daily.     . Semaglutide,0.25 or 0.5MG /DOS, (OZEMPIC, 0.25 OR 0.5 MG/DOSE,) 2 MG/1.5ML SOPN Inject 0.5 mg into the skin every Friday.    . sevelamer carbonate (RENVELA) 800 MG tablet Take 2,400 mg by mouth daily.     No current facility-administered medications for this visit.     Past Medical History:  Diagnosis Date  . Ambulates with cane    straight cane  . Cervical myelopathy ([place]) 02/06/2018  . Chronic kidney disease    dailysis M W F- home  . Complication of anesthesia   . Coronary artery disease   . Diabetes mellitus without complication ([place])    type2  . Diabetic foot ulcer ([place])   . Diabetic foot ulcer ([place])   . Diabetic neuropathy ([place]) 02/06/2018  . Gait abnormality 08/24/2018  . GERD (gastroesophageal reflux disease)     01/06/20- not current  . Gout   .  History of kidney stones    passed stones  . Hypercholesteremia   . Hypertension   . Hypothyroidism   . Neuromuscular disorder ([facility])    neuropathy left leg and bilateral feet  . Neuropathy   . PONV (postoperative nausea and vomiting)   . Prostate cancer ([place])     ROS:   All systems reviewed and negative except as noted in the HPI.   Past Surgical History:  Procedure Laterality Date  . A-FLUTTER ABLATION N/A 04/06/2020   Procedure: A-FLUTTER ABLATION;  Surgeon: [name], MD;  Location: [facility];  Service: Cardiovascular;  Laterality: N/A;  . AMPUTATION Left 12/25/2013   Procedure: AMPUTATION RAY LEFT 5TH RAY;  Surgeon: [name], MD;  Location: [facility];  Service: Orthopedics;  Laterality: Left;  . AMPUTATION Right  12/15/2018   Procedure: AMPUTATION OF 4TH AND 5TH TOES RIGHT FOOT;  Surgeon: [name], MD;  Location: [facility];  Service: Orthopedics;  Laterality: Right;  . APPLICATION OF WOUND VAC Right 12/15/2018   Procedure: APPLICATION OF WOUND VAC;  Surgeon: [name], MD;  Location: [facility];  Service: Orthopedics;  Laterality: Right;  . AV FISTULA PLACEMENT Left 03/25/2019   Procedure: LEFT ARM ARTERIOVENOUS (AV) FISTULA CREATION;  Surgeon: [name], MD;  Location: [facility];  Service: Vascular;  Laterality: Left;  . BACK SURGERY    . BASCILIC VEIN TRANSPOSITION Left 05/20/2019   Procedure: SECOND STAGE LEFT BASCILIC VEIN TRANSPOSITION;  Surgeon: [name], MD;  Location: [facility];  Service: Vascular;  Laterality: Left;  . CARDIAC CATHETERIZATION  02/17/2014  . CHOLECYSTECTOMY    . COLONOSCOPY  2011   in [place], [place] GRAFT  2008  . I & D EXTREMITY Right 12/15/2018   Procedure: DEBRIDEMENT RIGHT FOOT;  Surgeon: [name], MD;  Location: [facility];  Service: Orthopedics;  Laterality: Right;  . I & D EXTREMITY Right 08/27/2019   Procedure: PARTIAL CUBOID EXCISION RIGHT FOOT;  Surgeon: [name], MD;  Location: [facility];  Service: Orthopedics;  Laterality: Right;  . I & D EXTREMITY Right 01/07/2020   Procedure: RIGHT FOOT EXCISION INFECTED BONE;  Surgeon: [name], MD;  Location: [facility];  Service: Orthopedics;  Laterality: Right;  . NECK SURGERY     novemver 2019  . WISDOM TOOTH EXTRACTION       Family History  Problem Relation Age of Onset  . Diabetes Mellitus II Mother   . Kidney disease Mother   . Diabetes Mellitus II Father   . CAD Father   . Cancer Father        prostate  . Kidney disease Father   . Diabetes Mellitus II Brother   . Kidney disease Brother   . Diabetes Mellitus II Brother   . Stomach cancer Brother 65  . Kidney disease Brother   . Colon cancer Neg Hx   . Colon polyps Neg Hx   . Esophageal cancer Neg Hx   . Rectal cancer  Neg Hx   . Pancreatic cancer Neg Hx      Social History   Socioeconomic History  . Marital status: Married    Spouse name: Not on file  . Number of children: 2  . Years of education: Not on file  . Highest education level: Not on file  Occupational History  . Not on file  Tobacco Use  . Smoking status: Former Smoker  Years: 25.00    Types: Cigars    Start date: 04/08/1973    Quit date: 12/24/1988    Years since quitting: 31.3  . Smokeless tobacco: Never Used  . Tobacco comment: Cigars and Pipe   Vaping Use  . Vaping Use: Never used  Substance and Sexual Activity  . Alcohol use: No  . Drug use: No  . Sexual activity: Yes    Partners: Female  Other Topics Concern  . Not on file  Social History Narrative   Regular exercise: yes 3 times a week   Caffeine use: hot tea   Social Determinants of Health   Financial Resource Strain: Not on file  Food Insecurity: Not on file  Transportation Needs: Not on file  Physical Activity: Not on file  Stress: Not on file  Social Connections: Not on file  Intimate Partner Violence: Not on file     BP (!) 164/80   Pulse 77   Ht 5\' 10"  (1.778 m)   Wt 189 lb (85.7 kg)   SpO2 95%   BMI 27.12 kg/m   Physical Exam:  Well appearing NAD HEENT: Unremarkable Neck:  No JVD, no thyromegally Lymphatics:  No adenopathy Back:  No CVA tenderness Lungs:  Clear with no wheezes HEART:  Regular rate rhythm, no murmurs, no rubs, no clicks Abd:  soft, positive bowel sounds, no organomegally, no rebound, no guarding Ext:  2 plus pulses, no edema, no cyanosis, no clubbing Skin:  No rashes no nodules Neuro:  CN II through XII intact, motor grossly intact  EKG - nsr   Assess/Plan: 1. Atrial flutter - he is doing well, s/p EPS/RFA. He has stopped his systemic anti-coagulation. He will undergo watchful waiting. 2. HTN - his bp is elevated.  3. CAD - he denies anginal symptoms. We will follow.  Carleene Overlie Taylor,MD

## 2020-05-10 NOTE — Patient Instructions (Addendum)
Medication Instructions:  Your physician recommends that you continue on your current medications as directed. Please refer to the Current Medication list given to you today.  Labwork: None ordered.  Testing/Procedures: None ordered.  Follow-Up: Your physician wants you to follow-up in: as needed with Dr. Taylor.      Any Other Special Instructions Will Be Listed Below (If Applicable).  If you need a refill on your cardiac medications before your next appointment, please call your pharmacy.   

## 2020-05-11 ENCOUNTER — Encounter: Payer: Self-pay | Admitting: Internal Medicine

## 2020-05-11 ENCOUNTER — Ambulatory Visit: Payer: Medicare Other | Admitting: Orthopedic Surgery

## 2020-05-11 ENCOUNTER — Other Ambulatory Visit: Payer: Self-pay

## 2020-05-11 ENCOUNTER — Ambulatory Visit (AMBULATORY_SURGERY_CENTER): Payer: Medicare Other | Admitting: Internal Medicine

## 2020-05-11 VITALS — BP 151/51 | HR 62 | Temp 97.4°F | Resp 16 | Ht 70.0 in | Wt 189.0 lb

## 2020-05-11 DIAGNOSIS — R110 Nausea: Secondary | ICD-10-CM

## 2020-05-11 DIAGNOSIS — D649 Anemia, unspecified: Secondary | ICD-10-CM

## 2020-05-11 MED ORDER — PROCHLORPERAZINE MALEATE 5 MG PO TABS: 5.0000 mg | ORAL_TABLET | Freq: Four times a day (QID) | ORAL | 3 refills | Status: DC | PRN

## 2020-05-11 MED ORDER — SODIUM CHLORIDE 0.9 % IV SOLN
500.0000 mL | INTRAVENOUS | Status: DC
Start: 2020-05-11 — End: 2021-05-24

## 2020-05-11 MED ORDER — PANTOPRAZOLE SODIUM 40 MG PO TBEC: 40.0000 mg | DELAYED_RELEASE_TABLET | Freq: Every day | ORAL | 6 refills | Status: DC

## 2020-05-11 NOTE — Patient Instructions (Signed)
Compazine Rx refilled   Pantoprazole ( reflux medication) ordered for you to pick up [facility]   Minimize or eliminate narcotic use   Office follow up with Cottie Banda -[name] in 4 weeks   Return to regular physicians for medical problems   YOU HAD AN ENDOSCOPIC PROCEDURE TODAY AT [facility]:   Refer to the procedure report that was given to you for any specific questions about what was found during the examination.  If the procedure report does not answer your questions, please call your gastroenterologist to clarify.  If you requested that your care partner not be given the details of your procedure findings, then the procedure report has been included in a sealed envelope for you to review at your convenience later.  YOU SHOULD EXPECT: Some feelings of bloating in the abdomen. Passage of more gas than usual.  Walking can help get rid of the air that was put into your GI tract during the procedure and reduce the bloating. If you had a lower endoscopy (such as a colonoscopy or flexible sigmoidoscopy) you may notice spotting of blood in your stool or on the toilet paper. If you underwent a bowel prep for your procedure, you may not have a normal bowel movement for a few days.  Please Note:  You might notice some irritation and congestion in your nose or some drainage.  This is from the oxygen used during your procedure.  There is no need for concern and it should clear up in a day or so.  SYMPTOMS TO REPORT IMMEDIATELY:     Following upper endoscopy (EGD)  Vomiting of blood or coffee ground material  New chest pain or pain under the shoulder blades  Painful or persistently difficult swallowing  New shortness of breath  Fever of 100F or higher  Black, tarry-looking stools  For urgent or emergent issues, a gastroenterologist can be reached at any hour by calling [phone]. Do not use MyChart messaging for urgent concerns.    DIET:  We do recommend  a small meal at first, but then you may proceed to your regular diet.  Drink plenty of fluids but you should avoid alcoholic beverages for 24 hours.  ACTIVITY:  You should plan to take it easy for the rest of today and you should NOT DRIVE or use heavy machinery until tomorrow (because of the sedation medicines used during the test).    FOLLOW UP: Our staff will call the number listed on your records 48-72 hours following your procedure to check on you and address any questions or concerns that you may have regarding the information given to you following your procedure. If we do not reach you, we will leave a message.  We will attempt to reach you two times.  During this call, we will ask if you have developed any symptoms of COVID 19. If you develop any symptoms (ie: fever, flu-like symptoms, shortness of breath, cough etc.) before then, please call [phone].  If you test positive for Covid 19 in the 2 weeks post procedure, please call and report this information to [place].    If any biopsies were taken you will be contacted by phone or by letter within the next 1-3 weeks.  Please call us at [phone] if you have not heard about the biopsies in 3 weeks.    SIGNATURES/CONFIDENTIALITY: You and/or your care partner have signed paperwork which will be entered into your electronic medical record.  These signatures  attest to the fact that that the information above on your After Visit Summary has been reviewed and is understood.  Full responsibility of the confidentiality of this discharge information lies with you and/or your care-partner.

## 2020-05-11 NOTE — Progress Notes (Signed)
pt tolerated well. VSS. awake and to recovery. Report given to RN. Bite block used without trauma. 

## 2020-05-11 NOTE — Op Note (Signed)
[facility] Patient Name: [name] Procedure Date: 05/11/2020 2:13 PM MRN: [id number] Endoscopist: Docia Chuck. Henrene Pastor , MD Age: 70 Referring MD:  Date of Birth: 1950/07/23 Gender: Male Account #: [id number] Procedure:                Upper GI endoscopy Indications:              Nausea Medicines:                Monitored Anesthesia Care Procedure:                Pre-Anesthesia Assessment:                           - Prior to the procedure, a History and Physical                            was performed, and patient medications and                            allergies were reviewed. The patient's tolerance of                            previous anesthesia was also reviewed. The risks                            and benefits of the procedure and the sedation                            options and risks were discussed with the patient.                            All questions were answered, and informed consent                            was obtained. Prior Anticoagulants: The patient has                            taken no previous anticoagulant or antiplatelet                            agents. ASA Grade Assessment: III - A patient with                            severe systemic disease. After reviewing the risks                            and benefits, the patient was deemed in                            satisfactory condition to undergo the procedure.                           After obtaining informed consent, the endoscope was  passed under direct vision. Throughout the                            procedure, the patient's blood pressure, pulse, and                            oxygen saturations were monitored continuously. The                            Endoscope was introduced through the mouth, and                            advanced to the second part of duodenum. The upper                            GI endoscopy was accomplished without difficulty.                             The patient tolerated the procedure well. Scope In: Scope Out: Findings:                 The esophagus was normal.                           The stomach was normal.                           The examined duodenum was normal.                           The cardia and gastric fundus were normal on                            retroflexion. Complications:            No immediate complications. Estimated Blood Loss:     Estimated blood loss: none. Impression:               1. Normal EGD                           2. No cause for chronic nausea found on endoscopy.                           3. Other potential causes for chronic nausea                            include medications, medical disease such as                            chronic kidney problems, and delayed gastric                            emptying Recommendation:           1. Patient has a contact number available for  emergencies. The signs and symptoms of potential                            delayed complications were discussed with the                            patient. Return to normal activities tomorrow.                            Written discharge instructions were provided to the                            patient.                           2. Resume previous diet.                           3. Continue present medications.                           4. Refill Compazine prescription. Provide an                            additional 3 refills                           5. Prescribe pantoprazole 40 mg daily; #30; 6                            refills                           6. Schedule solid-phase gastric emptying scan "rule                            out gastroparesis"                           7. Return to your regular physicians regarding                            management of your multiple medical problems                           8. Minimize or eliminate narcotic use as  this can                            severely impair gastric emptying and resulted in                            severe nausea & patient                           9. Office follow-up with Carl Best in 4  weeks Docia Chuck. Henrene Pastor, MD 05/11/2020 2:48:29 PM This report has been signed electronically.

## 2020-05-12 ENCOUNTER — Other Ambulatory Visit: Payer: Self-pay

## 2020-05-12 ENCOUNTER — Telehealth: Payer: Self-pay

## 2020-05-12 DIAGNOSIS — R110 Nausea: Secondary | ICD-10-CM

## 2020-05-12 NOTE — Telephone Encounter (Signed)
Pt scheduled for GES at [facility] 06/08/20@7 :30am, pt to arrive there at 7am. Pt to be NPO after midnight and hold stomach meds. Pt scheduled to see Dr. Henrene Pastor 06/14/20 at 1:30pm. Left message for pt to call back.

## 2020-05-15 ENCOUNTER — Telehealth: Payer: Self-pay

## 2020-05-15 NOTE — Telephone Encounter (Signed)
First attempt follow up call to pt, lm on vm 

## 2020-05-15 NOTE — Telephone Encounter (Signed)
  Follow up Call-  Call back number 05/11/2020  Post procedure Call Back phone  # [phone]  Permission to leave phone message Yes  Some recent data might be hidden     Patient questions:  Do you have a fever, pain , or abdominal swelling? No. Pain Score  0 *  Have you tolerated food without any problems? Yes.    Have you been able to return to your normal activities? Yes.    Do you have any questions about your discharge instructions: Diet   No. Medications  No. Follow up visit  No.  Do you have questions or concerns about your Care? No.  Actions: * If pain score is 4 or above: No action needed, pain <4.  1. Have you developed a fever since your procedure? No  2.   Have you had an respiratory symptoms (SOB or cough) since your procedure? No 3.   Have you tested positive for COVID 19 since your procedure No  4.   Have you had any family members/close contacts diagnosed with the COVID 19 since your procedure?  No   If yes to any of these questions please route to Joylene John, RN and Joella Prince, RN

## 2020-05-15 NOTE — Telephone Encounter (Signed)
Left message for pt to call back  °

## 2020-05-16 ENCOUNTER — Ambulatory Visit (INDEPENDENT_AMBULATORY_CARE_PROVIDER_SITE_OTHER): Payer: Medicare Other | Admitting: Orthopedic Surgery

## 2020-05-16 DIAGNOSIS — Z89421 Acquired absence of other right toe(s): Secondary | ICD-10-CM | POA: Diagnosis not present

## 2020-05-16 DIAGNOSIS — I2510 Atherosclerotic heart disease of native coronary artery without angina pectoris: Secondary | ICD-10-CM

## 2020-05-16 NOTE — Telephone Encounter (Signed)
Spoke with pt and he is aware of appts. 

## 2020-05-16 NOTE — Telephone Encounter (Signed)
Pt is returning a missed call from the nurse. 

## 2020-05-25 ENCOUNTER — Other Ambulatory Visit: Payer: Self-pay | Admitting: Internal Medicine

## 2020-05-25 DIAGNOSIS — J90 Pleural effusion, not elsewhere classified: Secondary | ICD-10-CM

## 2020-05-26 ENCOUNTER — Encounter: Payer: Self-pay | Admitting: Internal Medicine

## 2020-05-26 ENCOUNTER — Ambulatory Visit (INDEPENDENT_AMBULATORY_CARE_PROVIDER_SITE_OTHER): Payer: Medicare Other | Admitting: Internal Medicine

## 2020-05-26 ENCOUNTER — Other Ambulatory Visit: Payer: Self-pay

## 2020-05-26 ENCOUNTER — Ambulatory Visit (INDEPENDENT_AMBULATORY_CARE_PROVIDER_SITE_OTHER): Payer: Medicare Other

## 2020-05-26 DIAGNOSIS — J90 Pleural effusion, not elsewhere classified: Secondary | ICD-10-CM | POA: Diagnosis not present

## 2020-05-26 DIAGNOSIS — R053 Chronic cough: Secondary | ICD-10-CM | POA: Diagnosis not present

## 2020-05-26 DIAGNOSIS — I2510 Atherosclerotic heart disease of native coronary artery without angina pectoris: Secondary | ICD-10-CM

## 2020-05-26 NOTE — Patient Instructions (Signed)
If night time cough recurs, restart pepcid 20 mg  evening   Also keep in mind bed blocks no mint or menthol products  Follow up is as needed

## 2020-05-26 NOTE — Progress Notes (Signed)
[name], male    DOB: 1950-10-07      MRN: [id number]   Brief patient profile:  34 yowm quit smoking 1990 no sequelae with  hbp /dm/ IHD s/p cabg 2005 [place], [place] on HD since early  of Jan 2021 non-stop since since so referred to pulmonary clinic 06/25/2019 by Dr   Deterding    History of Present Illness  06/25/2019  Pulmonary/ 1st office eval/Hisako Bugh  Chief Complaint  Patient presents with  . Pulmonary Consult    Referred by Dr [name]. Pt c/o cough x 3 months- non prod.   Dyspnea:  Not limited by breathing from desired activities  Including wt lifting  Cough: indolent onset 3 m prior to OV   of persitent daily dry tickle in throat not really much production at all, no assoc nasal symptoms or prior h/o seasonal rhinitis or even much problem with colds in past. Sleep: at hs worse ? Better p inhaler, tessalon immediately on waking/ bed is flat with two big pillws and dog in bedroom  SABA use: 4 total per per day  Lots of halls not helping  Kouffman Reflux v Neurogenic Cough Differentiator Reflux Comments  Do you awaken from a sound sleep coughing violently?                            With trouble breathing? No sometimes   Do you have choking episodes when you cannot  Get enough air, gasping for air ?              When it's severe   Do you usually cough when you lie down into  The bed, or when you just lie down to rest ?                          Yes   Do you usually cough after meals or eating?         No   Do you cough when (or after) you bend over?    no   GERD SCORE     Kouffman Reflux v Neurogenic Cough Differentiator Neurogenic   Do you more-or-less cough all day long? sporadic   Does change of temperature make you cough? Cold    Does laughing or chuckling cause you to cough? no   Do fumes (perfume, automobile fumes, burned  Toast, etc.,) cause you to cough ?      no   Does speaking, singing, or talking on the phone cause you to cough   ?               [name]     Neurogenic/Airway score    REC Pantoprazole (protonix) 40 mg   Take  30-60 min before first meal of the day and Pepcid (famotidine)  20 mg one after supper  until return to office  For cough > tessalon 200 mg every 6-8 hours to eliminate all the coughing  gerd diet  Depomedrol 120 mg IM today        07/23/2019  f/u ov/Ranier Coach re: cough since first of 2021  Chief Complaint  Patient presents with  . Follow-up    Cough has resolved. He is taking the tessalon about 2 x per day. He has not had to use his albuterol.   Dyspnea:  Not limited by breathing from desired activities  No real aerobics Cough: gone but still using tessalon  bid Sleeping: slt elevation  pillows  SABA use: none  02: none rec Tessalon and f/u in 6 weeks > did not return       NP  Ov 01/28/20 2-3 weeks  Dry cough  Worse at night Using tessalon pearles every night  Worsened shortness of breath at night   Stopped reflux meds in June  2021   1. Chronic cough  - omeprazole (PRILOSEC) 40 MG capsule; Take 1 capsule (40 mg total) by mouth daily.  Dispense: 30 capsule; Refill: 3 - famotidine (PEPCID) 20 MG tablet; Take 1 tablet (20 mg total) by mouth at bedtime.  Dispense: 30 tablet; Refill: 3 - DG Chest 2 View; Future  Restart omeprazole 40 mg tablet  >>>Please take 1 tablet daily 15 minutes to 30 minutes before your first meal of the day as well as before your other medications >>>Try to take at the same time each day >>>take this medication daily  Restart Pepcid 20 mg at night  GERD management: >>>Avoid laying flat until 2 hours after meals >>>Elevate head of the bed including entire chest >>>Reduce size of meals and amount of fat, acid, spices, caffeine and sweets >>>If you are smoking, Please stop! >>>Decrease alcohol consumption >>>Work on maintaining a healthy weight with normal BMI   Cough Home Instructions:  We believe you have a chronic/cyclical cough that is aggravated by reflux , coughing ,  and drainage.  . Goal is to not Cough or clear throat.  Marland Kitchen Avoid coughing or clearing throat by using:  o non-mint products/sugarless candy o Water o ice chips o Remember NO MINT PRODUCTS  . Medications to use:  o Mucinex DM 1-2 every 12 hrs or Delsym 2 tsp every 12 hrs for cough (These are Over the counter) o Tessalon Three times a day  As needed  Cough.  o Prilosec 40mg  30 min before breakfast or dinner.  o Pepcid 20mg  before dinner  o Zyrtec 10mg  at bedtime (Can use generic, this is over the counter) o Chlor tabs 4mg  2 at bedtime  for nasal drip until cough is 100% cough free. (this medication is over the counter)    02/24/2020  Acute  ov/[place] office/Terance Pomplun re: severe  cough since early oct 2021  Chief Complaint  Patient presents with  . Follow-up    non productive cough gets SOB when coughing  previous cough 100% gone until returned to in center HD now back at home  Just started 02/11/20  Dyspnea: worse lying down / no problem walking with cane  Cough: after supper seems worse/ hacking dry cough Sleeping: flat bed/ 2 pillows under  head  SABA use: no better p saba  02: none  Not using 1st gen H1 blockers per guidelines and using mints (mentos) rec Pepcid 20 mg after supper   Omeprazole 40 mg Take 30-60 min before first meal of the day  GERD  Depomedrol 120 mg IM  Tessalon 200 mg about  Every 8 hours around the clock and supplement if needed  with either vicodin or percocet every 4 hours  As needed x 3 straight days For drainage / throat tickle try take CHLORPHENIRAMINE  4 mg  Please remember to go to the  x-ray department  > bilateral effusions    Keep the [facility] appointment  Add: needs echo to eval effusions > Gangi dx afib, planning cardioversion    03/16/2020  f/u ov/Corianna Avallone re:  Home hemodialysis new afib with fatigue > sob and cough are  better  Chief Complaint  Patient presents with  . Follow-up    Pt cough improved some. He notices most at night and early  am- non prod.   Dyspnea:  Not limited by breathing R foot/ 4 pronged walker Cough: dry cough mostly daytime, tessalon helps ? If better with 1st gen H1 blockers per guidelines  And  no noct doses of chlorpheniramine needed / uses daytime doesn't think it makes him sleepy,   Sleeping: flat bed/ 2 pillows better  SABA use: none 02: none  Rec Keep appt for January to follow up the fluid in your lungs after cardioversion        04/14/2020  f/u ov/Nazly Digilio re: chronic cough x Jan 2021/ B effusions s/p cardioversion 04/06/20  Chief Complaint  Patient presents with  . Follow-up    Cough has improved slightly and is non prod.   Dyspnea:  Limited by R foot / balance poor /ok pushing cart at HT no limiting sob  Cough: 75% better pm tessalon 200 qid / dry  Sleeping: flat bed/ 2pillows  SABA use: none 02: none  HD after supper / not taking hs pepcid rec Be sure to ask you kidney about timing of medications relative to your dialysis Pepcid 20 mg at bedtime  For drainage / throat tickle try take CHLORPHENIRAMINE  4 mg   05/26/2020  f/u ov/Delmar Arriaga re: chronic cough  Chief Complaint  Patient presents with  . Follow-up    CXR repeated today. Breathing is doing well and he is not coughing much at this point.    Dyspnea:  Not limited by breathing but  by R foot/ balance/ still doing HT and walking with 2 wheeled walker Cough: gone now - no need for suppression and no longer on pepcid hs  Sleeping: bed is flat, prefers R side down/ 2 pillow  SABA use: none 02: none Covid status: vax x 4    No obvious day to day or daytime variability or assoc excess/ purulent sputum or mucus plugs or hemoptysis or cp or chest tightness, subjective wheeze or overt sinus or hb symptoms.   Sleeping flat  without nocturnal  or early am exacerbation  of respiratory  c/o's or need for noct saba. Also denies any obvious fluctuation of symptoms with weather or environmental changes or other aggravating or alleviating factors  except as outlined above   No unusual exposure hx or h/o childhood pna/ asthma or knowledge of premature birth.  Current Allergies, Complete Past Medical History, Past Surgical History, Family History, and Social History were reviewed in [employer] record.  ROS  The following are not active complaints unless bolded Hoarseness, sore throat, dysphagia, dental problems, itching, sneezing,  nasal congestion or discharge of excess mucus or purulent secretions, ear ache,   fever, chills, sweats, unintended wt loss or wt gain, classically pleuritic or exertional cp,  orthopnea pnd or arm/hand swelling  or leg swelling, presyncope, palpitations, abdominal pain, anorexia, nausea, vomiting, diarrhea  or change in bowel habits or change in bladder habits, change in stools or change in urine, dysuria, hematuria,  rash, arthralgias, visual complaints, headache, numbness, weakness or ataxia or problems with walking or coordination,  change in mood or  memory.        Current Meds  Medication Sig  . acetaminophen (TYLENOL) 500 MG tablet Take 1,500 mg by mouth every 8 (eight) hours as needed for moderate pain.  Marland Kitchen allopurinol (ZYLOPRIM) 100 MG tablet Take 200 mg by  mouth daily.   . B Complex-C-Zn-Folic Acid (DIALYVITE 825 WITH ZINC) 0.8 MG TABS Take 1 tablet by mouth daily.  . calcitRIOL (ROCALTROL) 0.5 MCG capsule Take 0.5 mcg by mouth daily.   . carvedilol (COREG) 12.5 MG tablet Take 12.5 mg by mouth in the morning and at bedtime.  . chlorhexidine (PERIDEX) 0.12 % solution 15 mLs by Mouth Rinse route daily.  . diclofenac Sodium (VOLTAREN) 1 % GEL Apply 1 application topically 4 (four) times daily as needed (pain).  . Lancets (ONETOUCH DELICA PLUS [id number]) MISC   . levothyroxine (SYNTHROID, LEVOTHROID) 112 MCG tablet Take 112 mcg by mouth daily before breakfast. BRAND NAME SYNTHROID  . losartan (COZAAR) 100 MG tablet Take 100 mg by mouth daily.  . Methoxy PEG-Epoetin Beta (MIRCERA IJ)  Inject into the skin.  Marland Kitchen oxyCODONE-acetaminophen (PERCOCET/ROXICET) 5-325 MG tablet Take 1 tablet by mouth every 4 (four) hours as needed.  . pantoprazole (PROTONIX) 40 MG tablet Take 1 tablet (40 mg total) by mouth daily.  Vladimir Faster Glycol-Propyl Glycol (LUBRICANT EYE DROPS) 0.4-0.3 % SOLN Place 1-2 drops into both eyes 3 (three) times daily as needed (dry/irritated eyes.).  Marland Kitchen pregabalin (LYRICA) 150 MG capsule Take 1 capsule (150 mg total) by mouth in the morning and at bedtime.  . prochlorperazine (COMPAZINE) 5 MG tablet Take 1 tablet (5 mg total) by mouth every 6 (six) hours as needed for nausea or vomiting.  . rosuvastatin (CRESTOR) 10 MG tablet Take 10 mg by mouth daily.   . Semaglutide,0.25 or 0.5MG /DOS, (OZEMPIC, 0.25 OR 0.5 MG/DOSE,) 2 MG/1.5ML SOPN Inject 0.5 mg into the skin every Friday.  . sevelamer carbonate (RENVELA) 800 MG tablet Take 2,400 mg by mouth daily.           Past Medical History:  Diagnosis Date  . Ambulates with cane    straight cane  . Cervical myelopathy ([place]) 02/06/2018  . Chronic kidney disease    dailysis M W F  . Coronary artery disease   . Diabetes mellitus without complication ([place])    type2  . Diabetic foot ulcer ([place])   . Diabetic foot ulcer ([place])   . Diabetic neuropathy ([place]) 02/06/2018  . Gait abnormality 08/24/2018  . GERD (gastroesophageal reflux disease)   . History of kidney stones    passed stones  . Hypercholesteremia   . Hypertension   . Hypothyroidism   . Neuromuscular disorder (HCC)    neuropathy left leg and bilateral feet  . Neuropathy   . Prostate cancer ([place])        Objective:      04/14/2020        202 03/16/2020       199 02/24/2020     204   07/23/19 203 lb 12.8 oz (92.4 kg)  07/20/19 209 lb 3.2 oz (94.9 kg)  06/28/19 200 lb (90.7 kg)     Vital signs reviewed  05/26/2020  - Note at rest 02 sats  97% on RA   General appearance:    Pleasant elderly wm nad        HEENT : pt wearing mask not removed for exam  due to covid -19 concerns.    NECK :  without JVD/Nodes/TM/ nl carotid upstrokes bilaterally   LUNGS: no acc muscle use,  Nl contour chest which is clear to A and P bilaterally without cough on insp or exp maneuvers   CV:  RRR  no s3 or murmur or increase in P2, and trace edema both  ankles   ABD:  soft and nontender with nl inspiratory excursion in the supine position. No bruits or organomegaly appreciated, bowel sounds nl  MS:  ext warm with braces both LE's, calf tenderness, cyanosis or clubbing No obvious joint restrictions   SKIN: warm and dry without lesions    NEURO:  alert, approp, nl sensorium with  no motor or cerebellar deficits apparent.       CXR PA and Lateral:   05/26/2020 :    I personally reviewed images and agree with radiology impression as follows:   No active cardiopulmonary disease.          Assessment

## 2020-05-26 NOTE — Assessment & Plan Note (Signed)
New finding  01/27/20 in HD pt  - Gangi w/u 02/2020 dx afib> s/p caridoversion 04/06/20  Resolved at this point > f/u prn   I had an extended discussion with the patient and reviewed all relevant studies and contingency plans so total time was 30 minutes with moderate level of MDM for this summary final f/u ov   Each maintenance medication was reviewed in detail including most importantly the difference between maintenance and prns and under what circumstances the prns are to be triggered using an action plan format that is not reflected in the computer generated alphabetically organized AVS.     Please see AVS for specific instructions unique to this visit that I personally wrote and verbalized to the the pt in detail and then reviewed with pt  by my nurse highlighting any  changes in therapy recommended at today's visit to their plan of care.

## 2020-05-26 NOTE — Assessment & Plan Note (Signed)
Onset around 1st of 2021  - Allergy profile 06/25/2019 >  Eos 0.2 /  IgE 10  RAST neg  - max gerd rx 06/25/2019 > much better 07/23/2019 rec taper tessalon down/ off - recurrent cough while off gerd rx early oct 2021 assoc with new bilateral pl effusions - cyclical cough regimen 25/24/7998  - cardioversion 04/06/20   Much better since cardioversion including the cough which could have been related to mild chf/vol overload so only issue is what pulmonary rx to use first noct cough comes back and they should probably include simple simple measures like bed blocks and pm pepcid  X at least 2 weeks then ov to regroup if persists

## 2020-05-29 ENCOUNTER — Encounter: Payer: Self-pay | Admitting: Orthopedic Surgery

## 2020-05-29 NOTE — Progress Notes (Signed)
Office Visit Note   Patient: [name]           Date of Birth: 1951/03/16           MRN: [id number] Visit Date: 05/16/2020              Requested by: Lorenda Hatchet, [place] [address],  [place] [id number] PCP: Lorenda Hatchet, FNP  Chief Complaint  Patient presents with  . Right Foot - Follow-up      HPI: Patient is a 70 year old gentleman who is seen in follow-up status post partial ray amputations right foot he is 4 months out.  Patient states he occasionally gets some pain in his heel he is wearing a double upright brace with a T-strap.  He does have orthotics.  Assessment & Plan: Visit Diagnoses:  1. History of partial ray amputation of fourth toe of right foot ([place])     Plan: Patient will continue with his activities of daily living no restrictions he is to follow-up if he develops any ulcers or calluses.  Follow-Up Instructions: Return if symptoms worsen or fail to improve.   Ortho Exam  Patient is alert, oriented, no adenopathy, well-dressed, normal affect, normal respiratory effort. Examination of the right foot there are no open ulcers there is no redness no cellulitis he has venous stasis insufficiency but no open ulcers in the leg.  Imaging: No results found. No images are attached to the encounter.  Labs: Lab Results  Component Value Date   HGBA1C 6.3 (H) 12/14/2018   HGBA1C 6.0 (H) 12/24/2015   HGBA1C 7.0 (H) 12/25/2013   ESRSEDRATE 34 (H) 01/24/2020   ESRSEDRATE 41 (H) 01/10/2020   ESRSEDRATE 55 (H) 12/27/2019   CRP 8.8 (H) 01/24/2020   CRP 35.9 (H) 01/10/2020   CRP 77.9 (H) 12/27/2019   REPTSTATUS 01/15/2020 FINAL 01/07/2020   GRAMSTAIN  01/07/2020    FEW WBC PRESENT, PREDOMINANTLY PMN NO ORGANISMS SEEN    CULT  01/07/2020    RARE STAPHYLOCOCCUS HAEMOLYTICUS RARE CORYNEBACTERIUM STRIATUM Standardized susceptibility testing for this organism is not available. RARE STENOTROPHOMONAS MALTOPHILIA CRITICAL RESULT  CALLED TO, READ BACK BY AND VERIFIED WITH: DR DUDA VIA EPIC [phone] FCP NO ANAEROBES ISOLATED Performed at [facility], [place] [address] [place], [place] [address]    LABORGA STAPHYLOCOCCUS HAEMOLYTICUS 01/07/2020   LABORGA STENOTROPHOMONAS MALTOPHILIA 01/07/2020     Lab Results  Component Value Date   ALBUMIN 4.4 05/08/2020   ALBUMIN 3.7 05/05/2020   ALBUMIN 3.0 (L) 12/14/2018   PREALBUMIN 14.8 (L) 12/14/2018    No results found for: MG No results found for: VD25OH  Lab Results  Component Value Date   PREALBUMIN 14.8 (L) 12/14/2018   CBC EXTENDED Latest Ref Rng & Units 05/08/2020 05/04/2020 03/24/2020  WBC 4.0 - 10.5 K/uL 5.1 4.4 6.0  RBC 4.22 - 5.81 Mil/uL 3.86(L) 3.50(L) 2.99(L)  HGB 13.0 - 17.0 g/dL 10.5(L) 9.7(L) 8.3(L)  HCT 39.0 - 52.0 % 32.3(L) 31.3(L) 25.7(L)  PLT 150.0 - 400.0 K/uL 144.0(L) 154 108(L)  NEUTROABS 1.4 - 7.7 K/uL 4.3 - 4.8  LYMPHSABS 0.7 - 4.0 K/uL 0.3(L) - 0.5(L)     There is no height or weight on file to calculate BMI.  Orders:  No orders of the defined types were placed in this encounter.  No orders of the defined types were placed in this encounter.    Procedures: No procedures performed  Clinical Data: No additional findings.  ROS:  All other systems negative, except as noted in the HPI. Review of Systems  Objective: Vital Signs: There were no vitals taken for this visit.  Specialty Comments:  No specialty comments available.  PMFS History: Patient Active Problem List   Diagnosis Date Noted  . Typical atrial flutter ([place]) 03/24/2020  . Bilateral pleural effusion 02/24/2020  . Healthcare maintenance 01/28/2020  . Shortness of breath 01/28/2020  . History of partial ray amputation of fourth toe of right foot ([place]) 09/08/2019  . Ulcerated, foot, right, with necrosis of bone ([place])   . Chronic cough 06/25/2019  . Cutaneous abscess of right foot   . Subacute osteomyelitis of right foot ([place]) 12/14/2018  . AKI  (acute kidney injury) ([place]) 12/14/2018  . CKD (chronic kidney disease) stage 4, GFR 15-29 ml/min (HCC) 12/14/2018  . Gait abnormality 08/24/2018  . Diabetic neuropathy ([place]) 02/06/2018  . Cervical myelopathy ([place]) 02/06/2018  . Onychomycosis 10/30/2017  . Other spondylosis with radiculopathy, cervical region 01/27/2017  . Midfoot ulcer, right, limited to breakdown of skin ([place]) 11/15/2016  . Lateral epicondylitis, left elbow 08/12/2016  . Cellulitis of fifth toe of right foot 07/29/2016  . Prostate cancer ([place]) 06/19/2016  . Non-pressure chronic ulcer of other part of right foot limited to breakdown of skin ([place]) 04/04/2016  . Cellulitis of leg, right 12/24/2015  . Cellulitis of right lower extremity 12/24/2015  . CKD (chronic kidney disease), stage III ([place]) 12/25/2013  . Foot infection 12/24/2013  . Foot ulcer, left ([place]) 12/24/2013  . Diabetic foot ulcer ([place]) 09/14/2012  . Gout 09/14/2012  . HTN (hypertension) 09/14/2012  . Hypothyroidism 09/14/2012  . CAD (coronary artery disease) 09/14/2012  . Diabetes mellitus ([place]) 09/14/2012  . Hypercholesteremia   . Neuropathy [facility])    Past Medical History:  Diagnosis Date  . Ambulates with cane    straight cane  . Cervical myelopathy ([place]) 02/06/2018  . Chronic kidney disease    dailysis M W F- home  . Complication of anesthesia   . Coronary artery disease   . Diabetes mellitus without complication ([place])    type2  . Diabetic foot ulcer ([place])   . Diabetic foot ulcer ([place])   . Diabetic neuropathy ([place]) 02/06/2018  . Gait abnormality 08/24/2018  . GERD (gastroesophageal reflux disease)     01/06/20- not current  . Gout   . History of kidney stones    passed stones  . Hypercholesteremia   . Hypertension   . Hypothyroidism   . Neuromuscular disorder (HCC)    neuropathy left leg and bilateral feet  . Neuropathy   . PONV (postoperative nausea and vomiting)   . Prostate cancer [facility])     Family History  Problem Relation Age of  Onset  . Diabetes Mellitus II Mother   . Kidney disease Mother   . Diabetes Mellitus II Father   . CAD Father   . Cancer Father        prostate  . Kidney disease Father   . Diabetes Mellitus II Brother   . Kidney disease Brother   . Diabetes Mellitus II Brother   . Stomach cancer Brother 82  . Kidney disease Brother   . Colon cancer Neg Hx   . Colon polyps Neg Hx   . Esophageal cancer Neg Hx   . Rectal cancer Neg Hx   . Pancreatic cancer Neg Hx     Past Surgical History:  Procedure Laterality Date  . A-FLUTTER ABLATION N/A 04/06/2020  Procedure: A-FLUTTER ABLATION;  Surgeon: [name], MD;  Location: [facility];  Service: Cardiovascular;  Laterality: N/A;  . AMPUTATION Left 12/25/2013   Procedure: AMPUTATION RAY LEFT 5TH RAY;  Surgeon: [name], MD;  Location: [facility];  Service: Orthopedics;  Laterality: Left;  . AMPUTATION Right 12/15/2018   Procedure: AMPUTATION OF 4TH AND 5TH TOES RIGHT FOOT;  Surgeon: [name], MD;  Location: [facility];  Service: Orthopedics;  Laterality: Right;  . APPLICATION OF WOUND VAC Right 12/15/2018   Procedure: APPLICATION OF WOUND VAC;  Surgeon: [name], MD;  Location: [facility];  Service: Orthopedics;  Laterality: Right;  . AV FISTULA PLACEMENT Left 03/25/2019   Procedure: LEFT ARM ARTERIOVENOUS (AV) FISTULA CREATION;  Surgeon: [name], MD;  Location: [facility];  Service: Vascular;  Laterality: Left;  . BACK SURGERY    . BASCILIC VEIN TRANSPOSITION Left 05/20/2019   Procedure: SECOND STAGE LEFT BASCILIC VEIN TRANSPOSITION;  Surgeon: [name], MD;  Location: [facility];  Service: Vascular;  Laterality: Left;  . CARDIAC CATHETERIZATION  02/17/2014  . CHOLECYSTECTOMY    . COLONOSCOPY  2011   in [place], [place] GRAFT  2008  . I & D EXTREMITY Right 12/15/2018   Procedure: DEBRIDEMENT RIGHT FOOT;  Surgeon: [name], MD;  Location: [facility];  Service: Orthopedics;  Laterality: Right;  . I & D  EXTREMITY Right 08/27/2019   Procedure: PARTIAL CUBOID EXCISION RIGHT FOOT;  Surgeon: [name], MD;  Location: [place] [facility];  Service: Orthopedics;  Laterality: Right;  . I & D EXTREMITY Right 01/07/2020   Procedure: RIGHT FOOT EXCISION INFECTED BONE;  Surgeon: [name], MD;  Location: [facility];  Service: Orthopedics;  Laterality: Right;  . NECK SURGERY     novemver 2019  . WISDOM TOOTH EXTRACTION     Social History   Occupational History  . Not on file  Tobacco Use  . Smoking status: Former Smoker    Years: 25.00    Types: Cigars    Start date: 04/08/1973    Quit date: 12/24/1988    Years since quitting: 31.4  . Smokeless tobacco: Never Used  . Tobacco comment: Cigars and Pipe   Vaping Use  . Vaping Use: Never used  Substance and Sexual Activity  . Alcohol use: No  . Drug use: No  . Sexual activity: Yes    Partners: Female

## 2020-06-08 ENCOUNTER — Encounter (HOSPITAL_COMMUNITY)
Admission: RE | Admit: 2020-06-08 | Discharge: 2020-06-08 | Disposition: A | Discharge status: Home / Self Care | Payer: Medicare Other | Source: Ambulatory Visit | Attending: Internal Medicine | Admitting: Internal Medicine

## 2020-06-08 ENCOUNTER — Other Ambulatory Visit: Payer: Self-pay

## 2020-06-08 DIAGNOSIS — R110 Nausea: Secondary | ICD-10-CM

## 2020-06-08 MED ORDER — TECHNETIUM TC 99M SULFUR COLLOID
2.0000 | Freq: Once | INTRAVENOUS | Status: AC | PRN
  Administered 2020-06-08: 2 via INTRAVENOUS

## 2020-06-14 ENCOUNTER — Encounter: Payer: Self-pay | Admitting: Nurse Practitioner

## 2020-06-14 ENCOUNTER — Ambulatory Visit (INDEPENDENT_AMBULATORY_CARE_PROVIDER_SITE_OTHER): Payer: Medicare Other | Admitting: Nurse Practitioner

## 2020-06-14 VITALS — Ht 70.0 in | Wt 210.0 lb

## 2020-06-14 DIAGNOSIS — R110 Nausea: Secondary | ICD-10-CM

## 2020-06-14 NOTE — Patient Instructions (Addendum)
If you are age 72 or older, your body mass index should be between 23-30. Your Body mass index is 30.13 kg/m. If this is out of the aforementioned range listed, please consider follow up with your Primary Care Provider.  RECOMMENDATIONS:  Continue taking Pantoprazole 40 MG once a day and Compazine 5 MG twice a day as needed for nausea.  Please discuss with your primary care provider about doing a cologuard.  It was great seeing you today! Thank you for entrusting me with your care and choosing [facility]  Noralyn Pick, CRNP

## 2020-06-14 NOTE — Progress Notes (Signed)
06/14/2020 Greco Gastelum [id number] 1950-08-21   Chief Complaint: Nausea   History of Present Illness: Lowell Makara is a 70 year old male history of hypertension, coronary artery disease s/p 4 vessel CABG 02/2002, atrial flutter s/p ablation 04/06/2020, diabetes mellitus type 2, ESRD on hemodialysis Sun-Mon-Wed-Thur at home since 05/2019, anemia, peripheral neuropathy s/p partial amputation of the right foot and amputation of his left 5th toe, chronic cough, kidney stones, hypothyroidism, prostate cancer s/p radiation 2019. Past cholecytectomy 1988.   I initially saw the patient in office on 05/08/2020 due to worsening nausea. Refer to 05/08/2020 office consult for comprehensive history review.  He underwent EGD 05/11/2020 by Dr. Henrene Pastor which was normal.  A gastric empty study 06/08/2020 was normal.  He presents today for further follow-up.  He stated his nausea has abated.  He is eating at least 3 small meals daily.  He remains on Pantoprazole 40 mg once daily and Compazine 5 mg twice daily.  He has hematuria and he is in the process of scheduling appointment with his urologist for further evaluation.  He is passing normal formed brown bowel movements most days.  No rectal bleeding or melena. He underwent 2 colonoscopies in his lifetime.  His most recent colonoscopy was done in [place] in 2011 which he reported was normal. No history of colon polyps.  He does not wish to pursue a conventional screening colonoscopy at this time.  He will follow-up with his PCP to consider Cologuard testing.  CBC Latest Ref Rng & Units 05/08/2020 05/04/2020 03/24/2020  WBC 4.0 - 10.5 K/uL 5.1 4.4 6.0  Hemoglobin 13.0 - 17.0 g/dL 10.5(L) 9.7(L) 8.3(L)  Hematocrit 39.0 - 52.0 % 32.3(L) 31.3(L) 25.7(L)  Platelets 150.0 - 400.0 K/uL 144.0(L) 154 108(L)    CMP Latest Ref Rng & Units 05/08/2020 05/05/2020 05/04/2020  Glucose 70 - 99 mg/dL 151(H) - 118(H)  BUN 6 - 23 mg/dL 15 - 22  Creatinine 0.40 - 1.50 mg/dL 4.52(HH) - 4.63(H)   Sodium 135 - 145 mEq/L 135 - 137  Potassium 3.5 - 5.1 mEq/L 3.3(L) - 3.3(L)  Chloride 96 - 112 mEq/L 94(L) - 95(L)  CO2 19 - 32 mEq/L 33(H) - 28  Calcium 8.4 - 10.5 mg/dL 9.7 - 9.3  Total Protein 6.0 - 8.3 g/dL 6.2 5.7(L) -  Total Bilirubin 0.2 - 1.2 mg/dL 2.4(H) 1.8(H) -  Alkaline Phos 39 - 117 U/L 82 81 -  AST 0 - 37 U/L 22 21 -  ALT 0 - 53 U/L 16 16 -   Current Outpatient Medications on File Prior to Visit  Medication Sig Dispense Refill  . acetaminophen (TYLENOL) 500 MG tablet Take 1,500 mg by mouth every 8 (eight) hours as needed for moderate pain.    Marland Kitchen allopurinol (ZYLOPRIM) 100 MG tablet Take 200 mg by mouth daily.     . B Complex-C-Zn-Folic Acid (DIALYVITE 503 WITH ZINC) 0.8 MG TABS Take 1 tablet by mouth daily.    . calcitRIOL (ROCALTROL) 0.5 MCG capsule Take 0.5 mcg by mouth daily.     . carvedilol (COREG) 12.5 MG tablet Take 12.5 mg by mouth in the morning and at bedtime.    . chlorhexidine (PERIDEX) 0.12 % solution 15 mLs by Mouth Rinse route daily.    . diclofenac Sodium (VOLTAREN) 1 % GEL Apply 1 application topically 4 (four) times daily as needed (pain).    . Lancets (ONETOUCH DELICA PLUS [id number]) MISC     . levothyroxine (SYNTHROID, LEVOTHROID) 112 MCG tablet  Take 112 mcg by mouth daily before breakfast. BRAND NAME SYNTHROID    . losartan (COZAAR) 100 MG tablet Take 100 mg by mouth daily.    . Methoxy PEG-Epoetin Beta (MIRCERA IJ) Inject into the skin.    Marland Kitchen oxyCODONE-acetaminophen (PERCOCET/ROXICET) 5-325 MG tablet Take 1 tablet by mouth every 4 (four) hours as needed. 30 tablet 0  . pantoprazole (PROTONIX) 40 MG tablet Take 1 tablet (40 mg total) by mouth daily. 30 tablet 6  . Polyethyl Glycol-Propyl Glycol (LUBRICANT EYE DROPS) 0.4-0.3 % SOLN Place 1-2 drops into both eyes 3 (three) times daily as needed (dry/irritated eyes.).    Marland Kitchen pregabalin (LYRICA) 150 MG capsule Take 1 capsule (150 mg total) by mouth in the morning and at bedtime. 60 capsule 0  .  prochlorperazine (COMPAZINE) 5 MG tablet Take 1 tablet (5 mg total) by mouth every 6 (six) hours as needed for nausea or vomiting. 90 tablet 3  . rosuvastatin (CRESTOR) 10 MG tablet Take 10 mg by mouth daily.     . Semaglutide,0.25 or 0.5MG /DOS, (OZEMPIC, 0.25 OR 0.5 MG/DOSE,) 2 MG/1.5ML SOPN Inject 0.5 mg into the skin every Friday.     Current Facility-Administered Medications on File Prior to Visit  Medication Dose Route Frequency Provider Last Rate Last Admin  . 0.9 %  sodium chloride infusion  500 mL Intravenous Continuous Irene Shipper, MD       Allergies  Allergen Reactions  . Mushroom Extract Complex Nausea Only    Current Medications, Allergies, Past Medical History, Past Surgical History, Family History and Social History were reviewed in [employer] record.   Review of Systems:   Constitutional: Negative for fever, sweats, chills or weight loss.  Respiratory: Negative for shortness of breath.   Cardiovascular: Negative for chest pain, palpitations and leg swelling.  Gastrointestinal: See HPI.  Musculoskeletal: Negative for back pain or muscle aches.  Neurological: Negative for dizziness, headaches or paresthesias.    Physical Exam: Ht 5\' 10"  (1.778 m)   Wt 210 lb (95.3 kg)   BMI 30.13 kg/m   General: 70 year old male in no acute distress. Head: Normocephalic and atraumatic. Eyes: No scleral icterus. Conjunctiva pink . Ears: Normal auditory acuity. Mouth: Dentition intact. No ulcers or lesions.  Lungs: Clear throughout to auscultation. Heart: Regular rate and rhythm, no murmur. Abdomen: Soft, nontender and nondistended. No masses or hepatomegaly. Normal bowel sounds x 4 quadrants.  Rectal: Deferred. Musculoskeletal: Symmetrical with no gross deformities. Extremities: No edema. Neurological: Alert oriented x 4. No focal deficits.  Psychological: Alert and cooperative. Normal mood and affect  Assessment and Recommendations:  1.  Nausea,  resolved.  EGD 05/11/2020 was normal.  Gastric empty study 06/08/2020 was normal. -Continue Pantoprazole 40 mg once daily -Continue Compazine 5 mg 1 p.o. twice daily as needed -Patient to call our office if his nausea recurs  2. Chronic anemia secondary to ESRD on HD. Previously received IV iron. Stable Hg 10.5.  Iron level 56 and Iron saturation 19% on 05/08/2020.  3  Atrial flutter s/p ablation 03/2020 on Xarelto. CHA2DS2-VASc Score 4.  4. Coronary artery disease s/p CABG 2003  5.  Colon cancer screen. CTAP 05/05/2020 without contrast without evidence of colonic mass. Colonoscopy in 2011 reported as normal by the patient.  Does not wish to pursue a conventional screening colonoscopy at this time.  He wishes to follow-up with his PCP for Cologuard testing.  6. 5. Elevated T. Bilirubin level Indirect > direct indicating hemolysis  most likely from frequent hemodialysis verses Gilbert's Syndrome. Labs 05/08/2020: T. Bili 2.4. Direct bili 0.6. CTAP without contrast 05/05/2020 without evidence of biliary obstruction or mass but shows but heterogeneous liver enhancement, query acute hepatitis. Chronic calcific pancreatitis is stable since 2016.Acute hepatitis panel was negative.   7.  Hematuria -Follow-up with urology

## 2020-06-15 ENCOUNTER — Other Ambulatory Visit: Payer: Self-pay | Admitting: Internal Medicine

## 2020-06-15 ENCOUNTER — Emergency Department (HOSPITAL_COMMUNITY)
Admission: EM | Admit: 2020-06-15 | Discharge: 2020-06-15 | Disposition: A | Discharge status: Home / Self Care | Payer: Medicare Other | Source: Home / Self Care | Attending: Emergency Medicine | Admitting: Emergency Medicine

## 2020-06-15 ENCOUNTER — Other Ambulatory Visit: Payer: Self-pay

## 2020-06-15 DIAGNOSIS — R300 Dysuria: Secondary | ICD-10-CM

## 2020-06-15 DIAGNOSIS — R310 Gross hematuria: Secondary | ICD-10-CM

## 2020-06-15 DIAGNOSIS — Z87891 Personal history of nicotine dependence: Secondary | ICD-10-CM | POA: Insufficient documentation

## 2020-06-15 DIAGNOSIS — I2510 Atherosclerotic heart disease of native coronary artery without angina pectoris: Secondary | ICD-10-CM | POA: Insufficient documentation

## 2020-06-15 DIAGNOSIS — R339 Retention of urine, unspecified: Secondary | ICD-10-CM | POA: Insufficient documentation

## 2020-06-15 DIAGNOSIS — Z79899 Other long term (current) drug therapy: Secondary | ICD-10-CM | POA: Insufficient documentation

## 2020-06-15 DIAGNOSIS — Z951 Presence of aortocoronary bypass graft: Secondary | ICD-10-CM | POA: Insufficient documentation

## 2020-06-15 DIAGNOSIS — I120 Hypertensive chronic kidney disease with stage 5 chronic kidney disease or end stage renal disease: Secondary | ICD-10-CM | POA: Insufficient documentation

## 2020-06-15 DIAGNOSIS — E039 Hypothyroidism, unspecified: Secondary | ICD-10-CM | POA: Insufficient documentation

## 2020-06-15 DIAGNOSIS — Z992 Dependence on renal dialysis: Secondary | ICD-10-CM | POA: Insufficient documentation

## 2020-06-15 DIAGNOSIS — E1122 Type 2 diabetes mellitus with diabetic chronic kidney disease: Secondary | ICD-10-CM | POA: Insufficient documentation

## 2020-06-15 DIAGNOSIS — N186 End stage renal disease: Secondary | ICD-10-CM | POA: Insufficient documentation

## 2020-06-15 DIAGNOSIS — D62 Acute posthemorrhagic anemia: Secondary | ICD-10-CM | POA: Diagnosis not present

## 2020-06-15 DIAGNOSIS — Z8546 Personal history of malignant neoplasm of prostate: Secondary | ICD-10-CM | POA: Insufficient documentation

## 2020-06-15 DIAGNOSIS — N3041 Irradiation cystitis with hematuria: Secondary | ICD-10-CM | POA: Diagnosis not present

## 2020-06-15 DIAGNOSIS — R053 Chronic cough: Secondary | ICD-10-CM

## 2020-06-15 LAB — BASIC METABOLIC PANEL
Anion gap: 9 (ref 5–15)
BUN: 31 mg/dL — ABNORMAL HIGH (ref 8–23)
CO2: 28 mmol/L (ref 22–32)
Calcium: 9.2 mg/dL (ref 8.9–10.3)
Chloride: 98 mmol/L (ref 98–111)
Creatinine, Ser: 3.67 mg/dL — ABNORMAL HIGH (ref 0.61–1.24)
GFR, Estimated: 17 mL/min — ABNORMAL LOW (ref >60)
Glucose, Bld: 148 mg/dL — ABNORMAL HIGH (ref 70–99)
Potassium: 4.2 mmol/L (ref 3.5–5.1)
Sodium: 135 mmol/L (ref 135–145)

## 2020-06-15 LAB — CBC WITH DIFFERENTIAL/PLATELET
Abs Immature Granulocytes: 0.14 10*3/uL — ABNORMAL HIGH (ref 0.00–0.07)
Basophils Absolute: 0.1 10*3/uL (ref 0.0–0.1)
Basophils Relative: 1 %
Eosinophils Absolute: 0.1 10*3/uL (ref 0.0–0.5)
Eosinophils Relative: 2 %
HCT: 32.2 % — ABNORMAL LOW (ref 39.0–52.0)
Hemoglobin: 9.8 g/dL — ABNORMAL LOW (ref 13.0–17.0)
Immature Granulocytes: 2 %
Lymphocytes Relative: 5 %
Lymphs Abs: 0.3 10*3/uL — ABNORMAL LOW (ref 0.7–4.0)
MCH: 26.8 pg (ref 26.0–34.0)
MCHC: 30.4 g/dL (ref 30.0–36.0)
MCV: 88 fL (ref 80.0–100.0)
Monocytes Absolute: 0.4 10*3/uL (ref 0.1–1.0)
Monocytes Relative: 7 %
Neutro Abs: 4.8 10*3/uL (ref 1.7–7.7)
Neutrophils Relative %: 83 %
Platelets: 102 10*3/uL — ABNORMAL LOW (ref 150–400)
RBC: 3.66 MIL/uL — ABNORMAL LOW (ref 4.22–5.81)
RDW: 16.9 % — ABNORMAL HIGH (ref 11.5–15.5)
WBC: 5.9 10*3/uL (ref 4.0–10.5)
nRBC: 0 % (ref 0.0–0.2)

## 2020-06-15 LAB — URINALYSIS, MICROSCOPIC (REFLEX)
RBC / HPF: >50 RBC/hpf (ref 0–5)
Squamous Epithelial / HPF: NONE SEEN (ref 0–5)

## 2020-06-15 LAB — URINALYSIS, ROUTINE W REFLEX MICROSCOPIC

## 2020-06-15 MED ORDER — PHENAZOPYRIDINE HCL 100 MG PO TABS: 95.0000 mg | ORAL_TABLET | Freq: Once | ORAL | Status: DC

## 2020-06-15 NOTE — ED Triage Notes (Signed)
[facility] from home. C/O hematuria started on Monday, seen by urology Tuesday. "It is worse" Dialysis patient, had dialysis at home last night.

## 2020-06-15 NOTE — Discharge Instructions (Signed)
Please read and follow all provided instructions.  Your diagnoses today include:  1. Gross hematuria   2. Urinary retention   3. Dysuria     Tests performed today include: Blood cell counts (white, red, and platelets) - hemoglobin (red blood cell count) is okay Electrolytes  Kidney function test Urine test to check for infection - shows a lot of blood, unclear if there is an infection, culture sent  Vital signs. See below for your results today.   Medications prescribed:   None  Take any prescribed medications only as directed.  Home care instructions:  Follow any educational materials contained in this packet.  BE VERY CAREFUL not to take multiple medicines containing Tylenol (also called acetaminophen). Doing so can lead to an overdose which can damage your liver and cause liver failure and possibly death.   Follow-up instructions: Please follow-up with your urology providers tomorrow as planned for recheck.   Return instructions:   Please return to the Emergency Department if you experience worsening symptoms.   Please return if you have any other emergent concerns.  Additional Information:  Your vital signs today were: BP (!) 146/64   Pulse 73   Temp 97.9 F (36.6 C) (Oral)   Resp 17   SpO2 95%  If your blood pressure (BP) was elevated above 135/85 this visit, please have this repeated by your doctor within one month. --------------

## 2020-06-15 NOTE — ED Provider Notes (Signed)
[facility] Provider Note   CSN: [id number] Arrival date & time: 06/15/20  0630     History Chief Complaint  Patient presents with  . Hematuria    [name] is a 69 y.o. male.  Patient with history of end-stage renal disease on home hemodialysis, last performed at home last night, no anticoagulation, history of prostate cancer status post radiation therapy -- presents to the emergency department today for evaluation of dysuria and hematuria.  Patient states that he typically urinates twice a day.  Over the past 3 days he has had significant hematuria with passage of small clots.  He saw his urologist office 2 days ago.  He states that his urine culture was sent.  No other treatments at this time.  He denies associated fevers, chills, nausea, vomiting, diarrhea, back pain or flank pain.  The onset of this condition was acute. The course is worsening. Aggravating factors: none. Alleviating factors: none.          Past Medical History:  Diagnosis Date  . Ambulates with cane    straight cane  . Cervical myelopathy ([place]) 02/06/2018  . Chronic kidney disease    dailysis M W F- home  . Complication of anesthesia   . Coronary artery disease   . Diabetes mellitus without complication ([place])    type2  . Diabetic foot ulcer ([place])   . Diabetic foot ulcer ([place])   . Diabetic neuropathy ([place]) 02/06/2018  . Gait abnormality 08/24/2018  . GERD (gastroesophageal reflux disease)     01/06/20- not current  . Gout   . History of kidney stones    passed stones  . Hypercholesteremia   . Hypertension   . Hypothyroidism   . Neuromuscular disorder (HCC)    neuropathy left leg and bilateral feet  . Neuropathy   . PONV (postoperative nausea and vomiting)   . Prostate cancer [facility])     Patient Active Problem List   Diagnosis Date Noted  . Typical atrial flutter ([place]) 03/24/2020  . Bilateral pleural effusion 02/24/2020  . Healthcare maintenance 01/28/2020   . Shortness of breath 01/28/2020  . History of partial ray amputation of fourth toe of right foot ([place]) 09/08/2019  . Ulcerated, foot, right, with necrosis of bone ([place])   . Chronic cough 06/25/2019  . Cutaneous abscess of right foot   . Subacute osteomyelitis of right foot ([place]) 12/14/2018  . AKI (acute kidney injury) ([place]) 12/14/2018  . CKD (chronic kidney disease) stage 4, GFR 15-29 ml/min (HCC) 12/14/2018  . Gait abnormality 08/24/2018  . Diabetic neuropathy ([place]) 02/06/2018  . Cervical myelopathy ([place]) 02/06/2018  . Onychomycosis 10/30/2017  . Other spondylosis with radiculopathy, cervical region 01/27/2017  . Midfoot ulcer, right, limited to breakdown of skin ([place]) 11/15/2016  . Lateral epicondylitis, left elbow 08/12/2016  . Cellulitis of fifth toe of right foot 07/29/2016  . Prostate cancer ([place]) 06/19/2016  . Non-pressure chronic ulcer of other part of right foot limited to breakdown of skin ([place]) 04/04/2016  . Cellulitis of leg, right 12/24/2015  . Cellulitis of right lower extremity 12/24/2015  . CKD (chronic kidney disease), stage III ([place]) 12/25/2013  . Foot infection 12/24/2013  . Foot ulcer, left ([name]) 12/24/2013  . Diabetic foot ulcer ([place]) 09/14/2012  . Gout 09/14/2012  . HTN (hypertension) 09/14/2012  . Hypothyroidism 09/14/2012  . CAD (coronary artery disease) 09/14/2012  . Diabetes mellitus ([place]) 09/14/2012  . Hypercholesteremia   . Neuropathy ([place])  Past Surgical History:  Procedure Laterality Date  . A-FLUTTER ABLATION N/A 04/06/2020   Procedure: A-FLUTTER ABLATION;  Surgeon: [name], MD;  Location: [facility];  Service: Cardiovascular;  Laterality: N/A;  . AMPUTATION Left 12/25/2013   Procedure: AMPUTATION RAY LEFT 5TH RAY;  Surgeon: [name], MD;  Location: [facility];  Service: Orthopedics;  Laterality: Left;  . AMPUTATION Right 12/15/2018   Procedure: AMPUTATION OF 4TH AND 5TH TOES RIGHT FOOT;  Surgeon: [name], MD;   Location: [facility];  Service: Orthopedics;  Laterality: Right;  . APPLICATION OF WOUND VAC Right 12/15/2018   Procedure: APPLICATION OF WOUND VAC;  Surgeon: [name], MD;  Location: [facility];  Service: Orthopedics;  Laterality: Right;  . AV FISTULA PLACEMENT Left 03/25/2019   Procedure: LEFT ARM ARTERIOVENOUS (AV) FISTULA CREATION;  Surgeon: [name], MD;  Location: [facility];  Service: Vascular;  Laterality: Left;  . BACK SURGERY    . BASCILIC VEIN TRANSPOSITION Left 05/20/2019   Procedure: SECOND STAGE LEFT BASCILIC VEIN TRANSPOSITION;  Surgeon: [name], MD;  Location: [facility];  Service: Vascular;  Laterality: Left;  . CARDIAC CATHETERIZATION  02/17/2014  . CHOLECYSTECTOMY    . COLONOSCOPY  2011   in [place], [place] GRAFT  2008  . I & D EXTREMITY Right 12/15/2018   Procedure: DEBRIDEMENT RIGHT FOOT;  Surgeon: [name], MD;  Location: [facility];  Service: Orthopedics;  Laterality: Right;  . I & D EXTREMITY Right 08/27/2019   Procedure: PARTIAL CUBOID EXCISION RIGHT FOOT;  Surgeon: [name], MD;  Location: [facility];  Service: Orthopedics;  Laterality: Right;  . I & D EXTREMITY Right 01/07/2020   Procedure: RIGHT FOOT EXCISION INFECTED BONE;  Surgeon: [name], MD;  Location: [facility];  Service: Orthopedics;  Laterality: Right;  . NECK SURGERY     novemver 2019  . WISDOM TOOTH EXTRACTION         Family History  Problem Relation Age of Onset  . Diabetes Mellitus II Mother   . Kidney disease Mother   . Diabetes Mellitus II Father   . CAD Father   . Cancer Father        prostate  . Kidney disease Father   . Diabetes Mellitus II Brother   . Kidney disease Brother   . Diabetes Mellitus II Brother   . Stomach cancer Brother 26  . Kidney disease Brother   . Colon cancer Neg Hx   . Colon polyps Neg Hx   . Esophageal cancer Neg Hx   . Rectal cancer Neg Hx   . Pancreatic cancer Neg Hx     Social History   Tobacco Use  . Smoking status:  Former Smoker    Years: 25.00    Types: Cigars    Start date: 04/08/1973    Quit date: 12/24/1988    Years since quitting: 31.4  . Smokeless tobacco: Never Used  . Tobacco comment: Cigars and Pipe   Vaping Use  . Vaping Use: Never used  Substance Use Topics  . Alcohol use: No  . Drug use: No    Home Medications Prior to Admission medications   Medication Sig Start Date End Date Taking? Authorizing Provider  acetaminophen (TYLENOL) 500 MG tablet Take 1,500 mg by mouth every 8 (eight) hours as needed for moderate pain.    [provider]  allopurinol (ZYLOPRIM) 100 MG tablet Take 200  mg by mouth daily.  05/04/18   [provider]  B Complex-C-Zn-Folic Acid (DIALYVITE 941 WITH ZINC) 0.8 MG TABS Take 1 tablet by mouth daily. 12/23/19   [provider]  calcitRIOL (ROCALTROL) 0.5 MCG capsule Take 0.5 mcg by mouth daily.  08/11/19   [provider]  carvedilol (COREG) 12.5 MG tablet Take 12.5 mg by mouth in the morning and at bedtime.    [provider]  chlorhexidine (PERIDEX) 0.12 % solution 15 mLs by Mouth Rinse route daily. 12/07/19   [provider]  diclofenac Sodium (VOLTAREN) 1 % GEL Apply 1 application topically 4 (four) times daily as needed (pain). 05/04/19   [provider]  Lancets (ONETOUCH DELICA PLUS [id number]) [place]  01/17/20   [provider]  levothyroxine (SYNTHROID, LEVOTHROID) 112 MCG tablet Take 112 mcg by mouth daily before breakfast. BRAND NAME SYNTHROID    [provider]  losartan (COZAAR) 100 MG tablet Take 100 mg by mouth daily.    [provider]  Methoxy PEG-Epoetin Beta (MIRCERA IJ) Inject into the skin. 12/23/19   [provider]  oxyCODONE-acetaminophen (PERCOCET/ROXICET) 5-325 MG tablet Take 1 tablet by mouth every 4 (four) hours as needed. 01/07/20   [name], MD  pantoprazole (PROTONIX) 40 MG tablet Take 1 tablet (40 mg total) by mouth daily. 05/11/20   Irene Shipper, MD  Polyethyl Glycol-Propyl Glycol (LUBRICANT EYE DROPS) 0.4-0.3 % SOLN Place 1-2 drops into both eyes 3 (three) times daily as needed (dry/irritated eyes.).    [provider]  pregabalin (LYRICA) 150 MG capsule Take 1 capsule (150 mg total) by mouth in the morning and at bedtime. 05/09/20   Persons, Bevely [facility], PA  prochlorperazine (COMPAZINE) 5 MG tablet Take 1 tablet (5 mg total) by mouth every 6 (six) hours as needed for nausea or vomiting. 05/11/20   Irene Shipper, MD  rosuvastatin (CRESTOR) 10 MG tablet Take 10 mg by mouth daily.     [provider]  Semaglutide,0.25 or 0.5MG /DOS, (OZEMPIC, 0.25 OR 0.5 MG/DOSE,) 2 MG/1.5ML SOPN Inject 0.5 mg into the skin every Friday.    [provider]    Allergies    Mushroom extract complex  Review of Systems   Review of Systems  Constitutional: Negative for fever.  HENT: Negative for rhinorrhea and sore throat.   Eyes: Negative for redness.  Respiratory: Negative for cough.   Cardiovascular: Negative for chest pain.  Gastrointestinal: Positive for abdominal pain. Negative for diarrhea, nausea and vomiting.  Genitourinary: Positive for decreased urine volume, dysuria and hematuria. Negative for frequency.  Musculoskeletal: Negative for myalgias.  Skin: Negative for rash.  Neurological: Negative for headaches.    Physical Exam Updated Vital Signs BP (!) 154/78 (BP Location: Left Arm)   Pulse 76   Temp 97.7 F (36.5 C) (Oral)   SpO2 97%   Physical Exam Vitals and nursing note reviewed.  Constitutional:      Appearance: He is well-developed.  HENT:     Head: Normocephalic and atraumatic.  Eyes:     General:        Right eye: No discharge.        Left eye: No discharge.     Conjunctiva/sclera: Conjunctivae normal.  Cardiovascular:     Rate and Rhythm: Normal rate and regular rhythm.     Heart sounds: Normal heart sounds.  Pulmonary:     Effort: Pulmonary effort is normal.     Breath sounds: Normal  breath sounds.  Abdominal:     Palpations: Abdomen is soft.     Tenderness: There is abdominal tenderness.     Comments: Mild suprapubic tenderness  Musculoskeletal:     Cervical back: Normal range of motion and neck supple.  Skin:    General: Skin is warm and dry.  Neurological:     Mental Status: He is alert.     ED Results / Procedures / Treatments   Labs (all labs ordered are listed, but only abnormal results are displayed) Labs Reviewed  CBC WITH DIFFERENTIAL/PLATELET - Abnormal; Notable for the following components:      Result Value   RBC 3.66 (*)    Hemoglobin 9.8 (*)    HCT 32.2 (*)    RDW 16.9 (*)    Platelets 102 (*)    Lymphs Abs 0.3 (*)    Abs Immature Granulocytes 0.14 (*)    All other components within normal limits  BASIC METABOLIC PANEL - Abnormal; Notable for the following components:   Glucose, Bld 148 (*)    BUN 31 (*)    Creatinine, Ser 3.67 (*)    GFR, Estimated 17 (*)    All other components within normal limits  URINALYSIS, ROUTINE W REFLEX MICROSCOPIC - Abnormal; Notable for the following components:   Color, Urine RED (*)    APPearance CLOUDY (*)    Glucose, UA   (*)    Value: TEST NOT REPORTED DUE TO COLOR INTERFERENCE OF URINE PIGMENT   Hgb urine dipstick   (*)    Value: TEST NOT REPORTED DUE TO COLOR INTERFERENCE OF URINE PIGMENT   Bilirubin Urine   (*)    Value: TEST NOT REPORTED DUE TO COLOR INTERFERENCE OF URINE PIGMENT   Ketones, ur   (*)    Value: TEST NOT REPORTED DUE TO COLOR INTERFERENCE OF URINE PIGMENT   Protein, ur   (*)    Value: TEST NOT REPORTED DUE TO COLOR INTERFERENCE OF URINE PIGMENT   Nitrite   (*)    Value: TEST NOT REPORTED DUE TO COLOR INTERFERENCE OF URINE PIGMENT   Leukocytes,Ua   (*)    Value: TEST NOT REPORTED DUE TO COLOR INTERFERENCE OF URINE PIGMENT   All other components within normal limits  URINALYSIS, MICROSCOPIC (REFLEX) - Abnormal; Notable for the following components:   Bacteria, UA RARE (*)    All  other components within normal limits  URINE CULTURE    EKG None  Radiology No results found.  Procedures Procedures   Medications Ordered in ED Medications - No data to display  ED Course  I have reviewed the triage vital signs and the nursing notes.  Pertinent labs & imaging results that were available during my care of the patient were reviewed by me and considered in my medical decision making (see chart for details).  Patient seen and examined. Labs ordered. WIll check bladder scan.   Vital signs reviewed and are as follows: BP (!) 154/78 (BP Location: Left Arm)   Pulse 76   Temp 97.7 F (36.5 C) (Oral)   SpO2 97%   Bladder scan 90cc. Discussed with Dr. Billy Fischer.   Discussed with Dr. Junious Silk.  Recommends reassurance.  Encouraged by stable H&H as well as no signs of retention.  Patient is due to schedule up with Dr. [name] for cystoscopy next week.  Encouraged to keep follow-up.   9:55 AM Pt seen by Dr. Billy Fischer, see her note for further details. In and out cath  was ordered.   11:02 AM Bladder irrigated with return of clots. Urology follow-up obtained for tomorrow for Dr. Will Bonnet discussion with urology. Plan for d/c, urology follow-up.     MDM Rules/Calculators/A&P                          Pt with hematuria, upcoming cystoscopy. Possible transient urinary outlet obstruction 2/2 clots. Hgb baseline and pt is home hemodialysis with no concerns for ESRD complications. Will be following up with urology tomorrow.   Final Clinical Impression(s) / ED Diagnoses Final diagnoses:  Gross hematuria  Urinary retention  Dysuria    Rx / DC Orders ED Discharge Orders    None       Carlisle Cater, PA-C 06/15/20 1130    Gareth Morgan, MD 06/16/20 2241

## 2020-06-15 NOTE — ED Notes (Addendum)
Foley catheter irrigated for bloody urine with multiple large clots. Tolerated well. Provider aware.

## 2020-06-15 NOTE — ED Notes (Signed)
Waiting for spouse to bring clothes.

## 2020-06-15 NOTE — ED Notes (Addendum)
Bladder scan result-94 cc's

## 2020-06-16 ENCOUNTER — Other Ambulatory Visit: Payer: Self-pay

## 2020-06-16 ENCOUNTER — Inpatient Hospital Stay (HOSPITAL_COMMUNITY): Payer: Medicare Other

## 2020-06-16 ENCOUNTER — Inpatient Hospital Stay (HOSPITAL_COMMUNITY)
Admission: EM | Admit: 2020-06-16 | Discharge: 2020-07-01 | DRG: 665 | Disposition: A | Discharge status: Home / Self Care | Payer: Medicare Other | Attending: Internal Medicine | Admitting: Internal Medicine

## 2020-06-16 ENCOUNTER — Encounter (HOSPITAL_COMMUNITY): Payer: Self-pay

## 2020-06-16 DIAGNOSIS — R310 Gross hematuria: Secondary | ICD-10-CM | POA: Diagnosis not present

## 2020-06-16 DIAGNOSIS — E039 Hypothyroidism, unspecified: Secondary | ICD-10-CM | POA: Diagnosis present

## 2020-06-16 DIAGNOSIS — Z89422 Acquired absence of other left toe(s): Secondary | ICD-10-CM

## 2020-06-16 DIAGNOSIS — L89152 Pressure ulcer of sacral region, stage 2: Secondary | ICD-10-CM | POA: Diagnosis not present

## 2020-06-16 DIAGNOSIS — E1141 Type 2 diabetes mellitus with diabetic mononeuropathy: Secondary | ICD-10-CM | POA: Diagnosis present

## 2020-06-16 DIAGNOSIS — Z79899 Other long term (current) drug therapy: Secondary | ICD-10-CM

## 2020-06-16 DIAGNOSIS — N3041 Irradiation cystitis with hematuria: Secondary | ICD-10-CM | POA: Diagnosis not present

## 2020-06-16 DIAGNOSIS — L97509 Non-pressure chronic ulcer of other part of unspecified foot with unspecified severity: Secondary | ICD-10-CM | POA: Diagnosis not present

## 2020-06-16 DIAGNOSIS — Z833 Family history of diabetes mellitus: Secondary | ICD-10-CM | POA: Diagnosis not present

## 2020-06-16 DIAGNOSIS — E1122 Type 2 diabetes mellitus with diabetic chronic kidney disease: Secondary | ICD-10-CM | POA: Diagnosis present

## 2020-06-16 DIAGNOSIS — E785 Hyperlipidemia, unspecified: Secondary | ICD-10-CM | POA: Diagnosis present

## 2020-06-16 DIAGNOSIS — N35911 Unspecified urethral stricture, male, meatal: Secondary | ICD-10-CM | POA: Diagnosis present

## 2020-06-16 DIAGNOSIS — T83091A Other mechanical complication of indwelling urethral catheter, initial encounter: Secondary | ICD-10-CM | POA: Diagnosis present

## 2020-06-16 DIAGNOSIS — Z20822 Contact with and (suspected) exposure to covid-19: Secondary | ICD-10-CM | POA: Diagnosis present

## 2020-06-16 DIAGNOSIS — Z841 Family history of disorders of kidney and ureter: Secondary | ICD-10-CM | POA: Diagnosis not present

## 2020-06-16 DIAGNOSIS — Z800 Family history of malignant neoplasm of digestive organs: Secondary | ICD-10-CM

## 2020-06-16 DIAGNOSIS — N3289 Other specified disorders of bladder: Secondary | ICD-10-CM | POA: Diagnosis present

## 2020-06-16 DIAGNOSIS — E7800 Pure hypercholesterolemia, unspecified: Secondary | ICD-10-CM | POA: Diagnosis present

## 2020-06-16 DIAGNOSIS — E8889 Other specified metabolic disorders: Secondary | ICD-10-CM | POA: Diagnosis present

## 2020-06-16 DIAGNOSIS — Z87442 Personal history of urinary calculi: Secondary | ICD-10-CM

## 2020-06-16 DIAGNOSIS — E1151 Type 2 diabetes mellitus with diabetic peripheral angiopathy without gangrene: Secondary | ICD-10-CM | POA: Diagnosis present

## 2020-06-16 DIAGNOSIS — N2581 Secondary hyperparathyroidism of renal origin: Secondary | ICD-10-CM | POA: Diagnosis present

## 2020-06-16 DIAGNOSIS — Z87891 Personal history of nicotine dependence: Secondary | ICD-10-CM | POA: Diagnosis not present

## 2020-06-16 DIAGNOSIS — N186 End stage renal disease: Secondary | ICD-10-CM | POA: Diagnosis not present

## 2020-06-16 DIAGNOSIS — I2510 Atherosclerotic heart disease of native coronary artery without angina pectoris: Secondary | ICD-10-CM | POA: Diagnosis present

## 2020-06-16 DIAGNOSIS — E875 Hyperkalemia: Secondary | ICD-10-CM | POA: Diagnosis present

## 2020-06-16 DIAGNOSIS — I10 Essential (primary) hypertension: Secondary | ICD-10-CM | POA: Diagnosis present

## 2020-06-16 DIAGNOSIS — Z79890 Hormone replacement therapy (postmenopausal): Secondary | ICD-10-CM

## 2020-06-16 DIAGNOSIS — D62 Acute posthemorrhagic anemia: Secondary | ICD-10-CM

## 2020-06-16 DIAGNOSIS — L8990 Pressure ulcer of unspecified site, unspecified stage: Secondary | ICD-10-CM | POA: Insufficient documentation

## 2020-06-16 DIAGNOSIS — Y842 Radiological procedure and radiotherapy as the cause of abnormal reaction of the patient, or of later complication, without mention of misadventure at the time of the procedure: Secondary | ICD-10-CM | POA: Diagnosis present

## 2020-06-16 DIAGNOSIS — D631 Anemia in chronic kidney disease: Secondary | ICD-10-CM | POA: Diagnosis present

## 2020-06-16 DIAGNOSIS — Z8249 Family history of ischemic heart disease and other diseases of the circulatory system: Secondary | ICD-10-CM

## 2020-06-16 DIAGNOSIS — Z8546 Personal history of malignant neoplasm of prostate: Secondary | ICD-10-CM

## 2020-06-16 DIAGNOSIS — I120 Hypertensive chronic kidney disease with stage 5 chronic kidney disease or end stage renal disease: Secondary | ICD-10-CM | POA: Diagnosis not present

## 2020-06-16 DIAGNOSIS — Z992 Dependence on renal dialysis: Secondary | ICD-10-CM

## 2020-06-16 DIAGNOSIS — Z951 Presence of aortocoronary bypass graft: Secondary | ICD-10-CM

## 2020-06-16 DIAGNOSIS — E1159 Type 2 diabetes mellitus with other circulatory complications: Secondary | ICD-10-CM | POA: Diagnosis present

## 2020-06-16 DIAGNOSIS — R319 Hematuria, unspecified: Secondary | ICD-10-CM

## 2020-06-16 DIAGNOSIS — Z89421 Acquired absence of other right toe(s): Secondary | ICD-10-CM

## 2020-06-16 DIAGNOSIS — Z419 Encounter for procedure for purposes other than remedying health state, unspecified: Secondary | ICD-10-CM

## 2020-06-16 DIAGNOSIS — M109 Gout, unspecified: Secondary | ICD-10-CM | POA: Diagnosis present

## 2020-06-16 DIAGNOSIS — R339 Retention of urine, unspecified: Secondary | ICD-10-CM | POA: Diagnosis present

## 2020-06-16 DIAGNOSIS — Z794 Long term (current) use of insulin: Secondary | ICD-10-CM | POA: Diagnosis not present

## 2020-06-16 DIAGNOSIS — G5792 Unspecified mononeuropathy of left lower limb: Secondary | ICD-10-CM | POA: Diagnosis present

## 2020-06-16 DIAGNOSIS — E11621 Type 2 diabetes mellitus with foot ulcer: Secondary | ICD-10-CM | POA: Diagnosis not present

## 2020-06-16 DIAGNOSIS — R54 Age-related physical debility: Secondary | ICD-10-CM | POA: Diagnosis present

## 2020-06-16 DIAGNOSIS — E1169 Type 2 diabetes mellitus with other specified complication: Secondary | ICD-10-CM | POA: Diagnosis present

## 2020-06-16 DIAGNOSIS — E119 Type 2 diabetes mellitus without complications: Secondary | ICD-10-CM

## 2020-06-16 HISTORY — DX: Peripheral vascular disease, unspecified: I73.9

## 2020-06-16 LAB — URINE CULTURE: Culture: NO GROWTH

## 2020-06-16 LAB — BASIC METABOLIC PANEL
Anion gap: 10 (ref 5–15)
BUN: 45 mg/dL — ABNORMAL HIGH (ref 8–23)
CO2: 29 mmol/L (ref 22–32)
Calcium: 9.4 mg/dL (ref 8.9–10.3)
Chloride: 100 mmol/L (ref 98–111)
Creatinine, Ser: 5.59 mg/dL — ABNORMAL HIGH (ref 0.61–1.24)
GFR, Estimated: 10 mL/min — ABNORMAL LOW (ref >60)
Glucose, Bld: 140 mg/dL — ABNORMAL HIGH (ref 70–99)
Potassium: 4.1 mmol/L (ref 3.5–5.1)
Sodium: 139 mmol/L (ref 135–145)

## 2020-06-16 LAB — CBC
HCT: 26.1 % — ABNORMAL LOW (ref 39.0–52.0)
HCT: 23.5 % — ABNORMAL LOW (ref 39.0–52.0)
Hemoglobin: 8 g/dL — ABNORMAL LOW (ref 13.0–17.0)
Hemoglobin: 7.5 g/dL — ABNORMAL LOW (ref 13.0–17.0)
MCH: 26.6 pg (ref 26.0–34.0)
MCH: 27.1 pg (ref 26.0–34.0)
MCHC: 30.7 g/dL (ref 30.0–36.0)
MCHC: 31.9 g/dL (ref 30.0–36.0)
MCV: 86.7 fL (ref 80.0–100.0)
MCV: 84.8 fL (ref 80.0–100.0)
Platelets: 128 10*3/uL — ABNORMAL LOW (ref 150–400)
Platelets: 125 10*3/uL — ABNORMAL LOW (ref 150–400)
RBC: 3.01 MIL/uL — ABNORMAL LOW (ref 4.22–5.81)
RBC: 2.77 MIL/uL — ABNORMAL LOW (ref 4.22–5.81)
RDW: 17.1 % — ABNORMAL HIGH (ref 11.5–15.5)
RDW: 17.1 % — ABNORMAL HIGH (ref 11.5–15.5)
WBC: 6.3 10*3/uL (ref 4.0–10.5)
WBC: 8.1 10*3/uL (ref 4.0–10.5)
nRBC: 0 % (ref 0.0–0.2)
nRBC: 0 % (ref 0.0–0.2)

## 2020-06-16 LAB — HEMOGLOBIN A1C
Hgb A1c MFr Bld: 5.7 % — ABNORMAL HIGH (ref 4.8–5.6)
Mean Plasma Glucose: 116.89 mg/dL

## 2020-06-16 LAB — TSH: TSH: 4.878 u[IU]/mL — ABNORMAL HIGH (ref 0.350–4.500)

## 2020-06-16 LAB — GLUCOSE, CAPILLARY: Glucose-Capillary: 219 mg/dL — ABNORMAL HIGH (ref 70–99)

## 2020-06-16 LAB — HIV ANTIBODY (ROUTINE TESTING W REFLEX): HIV Screen 4th Generation wRfx: NONREACTIVE

## 2020-06-16 MED ORDER — ACETAMINOPHEN 325 MG PO TABS: 650.0000 mg | ORAL_TABLET | Freq: Four times a day (QID) | ORAL | Status: DC | PRN

## 2020-06-16 MED ORDER — SODIUM CHLORIDE 0.9 % IV SOLN
1.0000 g | INTRAVENOUS | Status: DC
  Administered 2020-06-16 – 2020-06-20 (×4): 1 g via INTRAVENOUS
  Filled 2020-06-16 (×2): qty 1
  Filled 2020-06-16: qty 10
  Filled 2020-06-16: qty 1

## 2020-06-16 MED ORDER — CARVEDILOL 12.5 MG PO TABS
12.5000 mg | ORAL_TABLET | Freq: Two times a day (BID) | ORAL | Status: DC
Start: 2020-06-16 — End: 2020-07-01
  Administered 2020-06-17 – 2020-07-01 (×21): 12.5 mg via ORAL
  Filled 2020-06-16 (×22): qty 1

## 2020-06-16 MED ORDER — ONDANSETRON HCL 4 MG PO TABS: 4.0000 mg | ORAL_TABLET | Freq: Four times a day (QID) | ORAL | Status: DC | PRN

## 2020-06-16 MED ORDER — FAMOTIDINE 20 MG PO TABS: 20.0000 mg | ORAL_TABLET | Freq: Every day | ORAL | Status: DC

## 2020-06-16 MED ORDER — LEVOTHYROXINE SODIUM 112 MCG PO TABS
112.0000 ug | ORAL_TABLET | Freq: Every day | ORAL | Status: DC
  Administered 2020-06-17 – 2020-07-01 (×14): 112 ug via ORAL
  Filled 2020-06-16 (×14): qty 1

## 2020-06-16 MED ORDER — OXYCODONE-ACETAMINOPHEN 5-325 MG PO TABS
1.0000 | ORAL_TABLET | ORAL | Status: DC | PRN
  Administered 2020-06-16 – 2020-06-18 (×2): 2 via ORAL
  Administered 2020-06-19: 1 via ORAL
  Administered 2020-06-20 – 2020-06-28 (×5): 2 via ORAL
  Filled 2020-06-16: qty 2
  Filled 2020-06-16: qty 1
  Filled 2020-06-16 (×8): qty 2

## 2020-06-16 MED ORDER — ONDANSETRON HCL 4 MG/2ML IJ SOLN: 4.0000 mg | Freq: Four times a day (QID) | INTRAMUSCULAR | Status: DC | PRN

## 2020-06-16 MED ORDER — ALLOPURINOL 100 MG PO TABS
200.0000 mg | ORAL_TABLET | Freq: Every day | ORAL | Status: DC
Start: 2020-06-17 — End: 2020-07-01
  Administered 2020-06-17 – 2020-07-01 (×14): 200 mg via ORAL
  Filled 2020-06-16 (×14): qty 2

## 2020-06-16 MED ORDER — POLYVINYL ALCOHOL 1.4 % OP SOLN
2.0000 [drp] | Freq: Three times a day (TID) | OPHTHALMIC | Status: DC | PRN
  Administered 2020-06-17: 2 [drp] via OPHTHALMIC
  Filled 2020-06-16: qty 15

## 2020-06-16 MED ORDER — SODIUM CHLORIDE 0.9 % IR SOLN: 3000.0000 mL | Status: DC

## 2020-06-16 MED ORDER — BELLADONNA ALKALOIDS-OPIUM 16.2-60 MG RE SUPP
1.0000 | Freq: Four times a day (QID) | RECTAL | Status: DC | PRN
  Administered 2020-06-16 – 2020-06-20 (×4): 1 via RECTAL
  Filled 2020-06-16 (×4): qty 1

## 2020-06-16 MED ORDER — PANTOPRAZOLE SODIUM 40 MG PO TBEC
40.0000 mg | DELAYED_RELEASE_TABLET | Freq: Every day | ORAL | Status: DC
  Administered 2020-06-17 – 2020-07-01 (×13): 40 mg via ORAL
  Filled 2020-06-16 (×14): qty 1

## 2020-06-16 MED ORDER — ACETAMINOPHEN 650 MG RE SUPP: 650.0000 mg | Freq: Four times a day (QID) | RECTAL | Status: DC | PRN

## 2020-06-16 MED ORDER — CAMPHOR-MENTHOL 0.5-0.5 % EX LOTN
1.0000 "application " | TOPICAL_LOTION | Freq: Three times a day (TID) | CUTANEOUS | Status: DC | PRN
  Filled 2020-06-16: qty 222

## 2020-06-16 MED ORDER — CALCITRIOL 0.25 MCG PO CAPS
0.5000 ug | ORAL_CAPSULE | Freq: Every day | ORAL | Status: DC
  Administered 2020-06-17 – 2020-07-01 (×15): 0.5 ug via ORAL
  Filled 2020-06-16 (×2): qty 2
  Filled 2020-06-16: qty 1
  Filled 2020-06-16 (×10): qty 2
  Filled 2020-06-16: qty 1

## 2020-06-16 MED ORDER — CHLORHEXIDINE GLUCONATE 0.12 % MT SOLN
15.0000 mL | Freq: Every day | OROMUCOSAL | Status: DC
  Administered 2020-06-17 – 2020-07-01 (×10): 15 mL via OROMUCOSAL
  Filled 2020-06-16 (×8): qty 15

## 2020-06-16 MED ORDER — LOSARTAN POTASSIUM 50 MG PO TABS
100.0000 mg | ORAL_TABLET | Freq: Every day | ORAL | Status: DC
  Administered 2020-06-17 – 2020-07-01 (×13): 100 mg via ORAL
  Filled 2020-06-16 (×12): qty 2

## 2020-06-16 MED ORDER — ROSUVASTATIN CALCIUM 5 MG PO TABS
10.0000 mg | ORAL_TABLET | Freq: Every day | ORAL | Status: DC
  Administered 2020-06-17 – 2020-07-01 (×14): 10 mg via ORAL
  Filled 2020-06-16 (×14): qty 2

## 2020-06-16 MED ORDER — NEPRO/CARBSTEADY PO LIQD
237.0000 mL | Freq: Three times a day (TID) | ORAL | Status: DC | PRN
  Filled 2020-06-16: qty 237

## 2020-06-16 MED ORDER — DOCUSATE SODIUM 100 MG PO CAPS
100.0000 mg | ORAL_CAPSULE | Freq: Two times a day (BID) | ORAL | Status: DC
  Administered 2020-06-16 – 2020-07-01 (×12): 100 mg via ORAL
  Filled 2020-06-16 (×23): qty 1

## 2020-06-16 MED ORDER — CALCIUM CARBONATE ANTACID 1250 MG/5ML PO SUSP
500.0000 mg | Freq: Four times a day (QID) | ORAL | Status: DC | PRN
  Filled 2020-06-16: qty 5

## 2020-06-16 MED ORDER — SORBITOL 70 % SOLN
30.0000 mL | Status: DC | PRN
  Filled 2020-06-16: qty 30

## 2020-06-16 MED ORDER — ZOLPIDEM TARTRATE 5 MG PO TABS
5.0000 mg | ORAL_TABLET | Freq: Every evening | ORAL | Status: DC | PRN
  Administered 2020-06-17 – 2020-06-27 (×4): 5 mg via ORAL
  Filled 2020-06-16 (×4): qty 1

## 2020-06-16 MED ORDER — PREGABALIN 75 MG PO CAPS
150.0000 mg | ORAL_CAPSULE | Freq: Two times a day (BID) | ORAL | Status: DC
  Administered 2020-06-16 – 2020-07-01 (×28): 150 mg via ORAL
  Filled 2020-06-16 (×28): qty 2

## 2020-06-16 MED ORDER — MORPHINE SULFATE (PF) 2 MG/ML IV SOLN
2.0000 mg | INTRAVENOUS | Status: DC | PRN
  Administered 2020-06-20 – 2020-06-23 (×13): 2 mg via INTRAVENOUS
  Filled 2020-06-16 (×15): qty 1

## 2020-06-16 MED ORDER — INSULIN ASPART 100 UNIT/ML ~~LOC~~ SOLN
0.0000 [IU] | Freq: Three times a day (TID) | SUBCUTANEOUS | Status: DC
  Administered 2020-06-18 – 2020-06-20 (×4): 1 [IU] via SUBCUTANEOUS
  Administered 2020-06-20: 2 [IU] via SUBCUTANEOUS
  Administered 2020-06-21 – 2020-06-22 (×3): 1 [IU] via SUBCUTANEOUS
  Administered 2020-06-24: 2 [IU] via SUBCUTANEOUS
  Administered 2020-06-24: 1 [IU] via SUBCUTANEOUS
  Administered 2020-06-25: 3 [IU] via SUBCUTANEOUS
  Administered 2020-06-25 (×2): 1 [IU] via SUBCUTANEOUS
  Administered 2020-06-26: 2 [IU] via SUBCUTANEOUS
  Administered 2020-06-26 – 2020-06-28 (×4): 1 [IU] via SUBCUTANEOUS
  Administered 2020-06-30: 3 [IU] via SUBCUTANEOUS
  Administered 2020-07-01: 1 [IU] via SUBCUTANEOUS
  Administered 2020-07-01: 2 [IU] via SUBCUTANEOUS
  Administered 2020-07-01: 1 [IU] via SUBCUTANEOUS

## 2020-06-16 MED ORDER — HYDROXYZINE HCL 25 MG PO TABS: 25.0000 mg | ORAL_TABLET | Freq: Three times a day (TID) | ORAL | Status: DC | PRN

## 2020-06-16 MED ORDER — DOCUSATE SODIUM 283 MG RE ENEM
1.0000 | ENEMA | RECTAL | Status: DC | PRN
  Filled 2020-06-16: qty 1

## 2020-06-16 MED ORDER — SODIUM CHLORIDE 0.9 % IR SOLN
3000.0000 mL | Status: DC
  Administered 2020-06-16 – 2020-06-22 (×16): 3000 mL

## 2020-06-16 MED ORDER — HYDRALAZINE HCL 20 MG/ML IJ SOLN
5.0000 mg | INTRAMUSCULAR | Status: DC | PRN
  Administered 2020-06-16: 5 mg via INTRAVENOUS
  Filled 2020-06-16: qty 1

## 2020-06-16 NOTE — ED Notes (Signed)
Unable to irrigate foley catheter d/t foley isn't a 3 way foley, notified admitting provider

## 2020-06-16 NOTE — Consult Note (Addendum)
Urology Consult   Physician requesting consult: Karmen Bongo, MD  Reason for consult: Gross hematuria  History of Present Illness: [name] is a 70 y.o. male with history of 4+3 prostate cancer s/p EBRT completed 09/05/2016, ESRD on home [facility]ThS HD, irregular heart rate s/p ablation in 03/2020, CAD s/p CABG ~5 years ago, HTN, HLD, and DM who presents to the ED for gross hematuria x 1 week. He saw Jiles Crocker, NP on 06/13/20 for gross hematuria. Urine culture at that time NG. His hematuria has progressively worsened with intermittent difficulty voiding due to clots. He saw Dr. [name] today. Per the patient, Dr. [name] attempted cystourethroscopy, but his visualization was completely obscured by clots, so he aborted the procedure and placed a 24 Fr 2-way foley catheter. Per patient, the Dr. [name] had to dilate his urethra in order to get the foley in. Given his degree of hematuria, he was instructed to go to the ED for further workup.  He is currently AFHDS. Hgb 8 from 9.8 yesterday. Baseline Hgb 8-11. CT A/P w contrast on 05/05/20 without remarkable GU pathology.   At baseline, the patient voids minimal amount 2-3 x daily. Denies fevers/chills. Denies prior episodes of gross hematuria. He takes a ASA 81mg  for preventative health.   At time of evaluation, RN currently at bedside irrigating foley catheter with NS and return of ~25cc medium-sized clots. When urology subsequently attempted to irrigate the foley catheter, it no longer irrigated easily, concerning of clot obstruction.   Past Medical History:  Diagnosis Date   Ambulates with cane    straight cane   Cervical myelopathy ([place]) 02/06/2018   Chronic kidney disease    dailysis M W F- home   Complication of anesthesia    Coronary artery disease    Diabetes mellitus without complication ([place])    type2   Diabetic foot ulcer ([place])    Diabetic neuropathy ([place]) 02/06/2018   Gait abnormality 08/24/2018   GERD (gastroesophageal  reflux disease)     01/06/20- not current   Gout    History of kidney stones    passed stones   Hypercholesteremia    Hypertension    Hypothyroidism    Neuromuscular disorder (HCC)    neuropathy left leg and bilateral feet   Neuropathy    PONV (postoperative nausea and vomiting)    Prostate cancer (HCC)    PVD (peripheral vascular disease) ([place])    with amputations    Past Surgical History:  Procedure Laterality Date   A-FLUTTER ABLATION N/A 04/06/2020   Procedure: A-FLUTTER ABLATION;  Surgeon: [name], MD;  Location: [facility];  Service: Cardiovascular;  Laterality: N/A;   AMPUTATION Left 12/25/2013   Procedure: AMPUTATION RAY LEFT 5TH RAY;  Surgeon: [name], MD;  Location: [facility];  Service: Orthopedics;  Laterality: Left;   AMPUTATION Right 12/15/2018   Procedure: AMPUTATION OF 4TH AND 5TH TOES RIGHT FOOT;  Surgeon: [name], MD;  Location: [facility];  Service: Orthopedics;  Laterality: Right;   APPLICATION OF WOUND VAC Right 12/15/2018   Procedure: APPLICATION OF WOUND VAC;  Surgeon: [name], MD;  Location: [facility];  Service: Orthopedics;  Laterality: Right;   AV FISTULA PLACEMENT Left 03/25/2019   Procedure: LEFT ARM ARTERIOVENOUS (AV) FISTULA CREATION;  Surgeon: [name], MD;  Location: [facility];  Service: Vascular;  Laterality: Left;   BACK SURGERY     BASCILIC VEIN TRANSPOSITION Left 05/20/2019   Procedure: SECOND STAGE LEFT  [place];  Surgeon: [name], MD;  Location: [facility];  Service: Vascular;  Laterality: Left;   CARDIAC CATHETERIZATION  02/17/2014   CHOLECYSTECTOMY     COLONOSCOPY  2011   in [place], [place]  2008   I & D EXTREMITY Right 12/15/2018   Procedure: DEBRIDEMENT RIGHT FOOT;  Surgeon: [name], MD;  Location: [facility];  Service: Orthopedics;  Laterality: Right;   I & D EXTREMITY Right 08/27/2019   Procedure: PARTIAL CUBOID EXCISION RIGHT FOOT;   Surgeon: [name], MD;  Location: [facility];  Service: Orthopedics;  Laterality: Right;   I & D EXTREMITY Right 01/07/2020   Procedure: RIGHT FOOT EXCISION INFECTED BONE;  Surgeon: [name], MD;  Location: [facility];  Service: Orthopedics;  Laterality: Right;   NECK SURGERY     novemver 2019   [place] EXTRACTION      Current Hospital Medications:  Home Meds:  Current Facility-Administered Medications on File Prior to Encounter  Medication Dose Route Frequency Provider Last Rate Last Admin   0.9 %  sodium chloride infusion  500 mL Intravenous Continuous Irene Shipper, MD       Current Outpatient Medications on File Prior to Encounter  Medication Sig Dispense Refill   acetaminophen (TYLENOL) 500 MG tablet Take 1,500 mg by mouth every 8 (eight) hours as needed for moderate pain.     allopurinol (ZYLOPRIM) 100 MG tablet Take 200 mg by mouth daily.      B Complex-C-Zn-Folic Acid (DIALYVITE 440 WITH ZINC) 0.8 MG TABS Take 1 tablet by mouth daily.     calcitRIOL (ROCALTROL) 0.5 MCG capsule Take 0.5 mcg by mouth daily.      carvedilol (COREG) 12.5 MG tablet Take 12.5 mg by mouth in the morning and at bedtime.     chlorhexidine (PERIDEX) 0.12 % solution 15 mLs by Mouth Rinse route daily.     diclofenac Sodium (VOLTAREN) 1 % GEL Apply 1 application topically 4 (four) times daily as needed (pain).     famotidine (PEPCID) 20 MG tablet TAKE 1 TABLET BY MOUTH AFTER SUPPER 90 tablet 0   Lancets (ONETOUCH DELICA PLUS [id number]) MISC      levothyroxine (SYNTHROID, LEVOTHROID) 112 MCG tablet Take 112 mcg by mouth daily before breakfast. BRAND NAME SYNTHROID     losartan (COZAAR) 100 MG tablet Take 100 mg by mouth daily.     Methoxy PEG-Epoetin Beta (MIRCERA IJ) Inject into the skin.     oxyCODONE-acetaminophen (PERCOCET/ROXICET) 5-325 MG tablet Take 1 tablet by mouth every 4 (four) hours as needed. 30 tablet 0   pantoprazole (PROTONIX) 40 MG tablet Take 1 tablet (40 mg total) by  mouth daily. 30 tablet 6   Polyethyl Glycol-Propyl Glycol (LUBRICANT EYE DROPS) 0.4-0.3 % SOLN Place 1-2 drops into both eyes 3 (three) times daily as needed (dry/irritated eyes.).     pregabalin (LYRICA) 150 MG capsule Take 1 capsule (150 mg total) by mouth in the morning and at bedtime. 60 capsule 0   prochlorperazine (COMPAZINE) 5 MG tablet Take 1 tablet (5 mg total) by mouth every 6 (six) hours as needed for nausea or vomiting. 90 tablet 3   rosuvastatin (CRESTOR) 10 MG tablet Take 10 mg by mouth daily.      Semaglutide,0.25 or 0.5MG /DOS, (OZEMPIC, 0.25 OR 0.5 MG/DOSE,) 2 MG/1.5ML SOPN Inject 0.5 mg into the skin every Friday.       Scheduled Meds: Continuous  Infusions:  sodium chloride     PRN Meds:.  Allergies:  Allergies  Allergen Reactions   Mushroom Extract Complex Nausea Only    Family History  Problem Relation Age of Onset   Diabetes Mellitus II Mother    Kidney disease Mother    Diabetes Mellitus II Father    CAD Father    Cancer Father        prostate   Kidney disease Father    Diabetes Mellitus II Brother    Kidney disease Brother    Diabetes Mellitus II Brother    Stomach cancer Brother 37   Kidney disease Brother    Colon cancer Neg Hx    Colon polyps Neg Hx    Esophageal cancer Neg Hx    Rectal cancer Neg Hx    Pancreatic cancer Neg Hx     Social History:  reports that he quit smoking about 31 years ago. His smoking use included cigars. He started smoking about 47 years ago. He quit after 25.00 years of use. He has never used smokeless tobacco. He reports that he does not drink alcohol and does not use drugs.  ROS: A complete review of systems was performed.  All systems are negative except for pertinent findings as noted.  Physical Exam:  Vital signs in last 24 hours: Temp:  [98.8 F (37.1 C)] 98.8 F (37.1 C) (03/11 1448) Pulse Rate:  [84-85] 85 (03/11 1620) Resp:  [14-18] 14 (03/11 1620) BP: (147-152)/(65-72) 152/72  (03/11 1620) SpO2:  [99 %] 99 % (03/11 1620) Weight:  [95.3 kg] 95.3 kg (03/11 1641) Constitutional:  Alert and oriented Cardiovascular: Regular rate  Respiratory: Normal respiratory effort Abdomen: Soft, nontender, nondistended GU: Normal circumcised phallus with 24 Fr 2-way foley catheter with cherry colored urine in bag. Doesn't appear to be draining  Neurologic: Grossly intact, no focal deficits Psychiatric: Normal mood and affect  Laboratory Data:  Recent Labs    06/15/20 0711 06/16/20 1456  WBC 5.9 6.3  HGB 9.8* 8.0*  HCT 32.2* 26.1*  PLT 102* 128*    Recent Labs    06/15/20 0711 06/16/20 1456  NA 135 139  K 4.2 4.1  CL 98 100  GLUCOSE 148* 140*  BUN 31* 45*  CALCIUM 9.2 9.4  CREATININE 3.67* 5.59*     Results for orders placed or performed during the hospital encounter of 06/16/20 (from the past 24 hour(s))  Basic metabolic panel     Status: Abnormal   Collection Time: 06/16/20  2:56 PM  Result Value Ref Range   Sodium 139 135 - 145 mmol/L   Potassium 4.1 3.5 - 5.1 mmol/L   Chloride 100 98 - 111 mmol/L   CO2 29 22 - 32 mmol/L   Glucose, Bld 140 (H) 70 - 99 mg/dL   BUN 45 (H) 8 - 23 mg/dL   Creatinine, Ser 5.59 (H) 0.61 - 1.24 mg/dL   Calcium 9.4 8.9 - 10.3 mg/dL   GFR, Estimated 10 (L) >60 mL/min   Anion gap 10 5 - 15  CBC     Status: Abnormal   Collection Time: 06/16/20  2:56 PM  Result Value Ref Range   WBC 6.3 4.0 - 10.5 K/uL   RBC 3.01 (L) 4.22 - 5.81 MIL/uL   Hemoglobin 8.0 (L) 13.0 - 17.0 g/dL   HCT 26.1 (L) 39.0 - 52.0 %   MCV 86.7 80.0 - 100.0 fL   MCH 26.6 26.0 - 34.0 pg   MCHC 30.7 30.0 -  36.0 g/dL   RDW 17.1 (H) 11.5 - 15.5 %   Platelets 128 (L) 150 - 400 K/uL   nRBC 0.0 0.0 - 0.2 %   Recent Results (from the past 240 hour(s))  Urine Culture     Status: None   Collection Time: 06/15/20 11:59 AM   Specimen: Urine, Random  Result Value Ref Range Status   Specimen Description URINE, RANDOM  Final   Special Requests NONE  Final    Culture   Final    NO GROWTH Performed at [facility], [place] [address], [place], [place] [address]    Report Status 06/16/2020 FINAL  Final    Renal Function: Recent Labs    06/15/20 0711 06/16/20 1456  CREATININE 3.67* 5.59*   Estimated Creatinine Clearance: 14.2 mL/min (A) (by C-G formula based on SCr of 5.59 mg/dL (H)).  Radiologic Imaging: No results found.   Impression/Recommendation  [name] is a 70 y.o. male with history of 4+3 prostate cancer s/p EBRT completed 09/05/2016, ESRD on home MWThS HD, irregular heart rate s/p ablation in 03/2020, CAD s/p CABG ~5 years ago, HTN, HLD, and DM who presents to the ED today for worsening gross hematuria x 1 week s/p aborted cystourethroscopy due to obscured visualization from hematuria and in-office urethral dilation and placement of 2-way 24 Fr foley catheter by Dr. [name] today. He is currently AFHDS. He has anemia to Hgb 8 from 9.8 yesterday (baseline 8-11).  Recent urine culture on 3/8 with NG. On ASA 81mg .   2-way 24 Fr foley catheter not draining or easily irrigating at bedside, concerning for clot obstruction. Urology perform urethral dilation and placement of a 3-way 20 Fr hematuria catheter at bedside. This foley was irrigated without return of more clot. CBI was initiated. Draining urine cherry on fast drip CBI. Please refer to separate procedure note for more details.  The patient's new onset gross hematuria is likely due to radiation cystitis given patient's history; however, he will need further hematuria workup to confirm the etiology once his hematuria has improved/resolved. Infection not likely given recent negative urine culture. We will continue to monitor his hematuria during his hospitalization.  - Please give ceftriaxone 1g x 1 dose for peri-procedural prophylaxis given significant urethral manipulation - Titrate CBI to achieve a cherry to pink urine. If foley catheter stops draining, please disconnect tubing  and manually irrigate foley catheter. If unable to irrigate foley catheter, please page urology - Please obtain a bladder ultrasound to evaluate for residual bladder clots - Please make patient NPO at midnight for possible procedure. If his hematuria worsens overnight, then we will consider taking him to the OR tomorrow for cystourethroscopy and fulguration +/- clot evacuation - Recommend B&O suppositories PRN for bladder spasms with foley in place (ordered) - Continue to trend Hgb. Transfused as needed - Please hold home ASA 81mg  given active hematuria - We will continue to follow. We greatly appreciate Family Medicine's help with this medically complicated patient     Celene Squibb 06/16/2020, 5:00 PM    I have seen and examined the patient and agree with the above assessment and plan.  -CBI overnight with intermittent manual irrigation. -Check RBUS -NPO after midnight for possible clot evacuation.  [facility]  Pager: [phone]

## 2020-06-16 NOTE — ED Triage Notes (Signed)
Pt sent back here from urologist office for admission, pt having continued hematuria. Foley placed in office today. Bright red blood noted in foley bag. Denies any pain

## 2020-06-16 NOTE — ED Provider Notes (Signed)
[facility] EMERGENCY DEPARTMENT Provider Note   CSN: [id number] Arrival date & time: 06/16/20  1439     History Chief Complaint  Patient presents with  . Hematuria    [name] is a 70 y.o. male.  HPI Patient reporting [facility] from [facility] by Dr. [name] for admission.  Has had around 4 days of hematuria now.  Passing some clots.  He is end-stage renal disease patient with home dialysis.  Reportedly saw his urologist 3 days ago and then again today.  Seen in the ER yesterday with a hemoglobin 9.8.  Had some irrigation done in the ER and follow-up in the office today.  Reportedly was unable to get cystoscopy and office due to the amount of blood and was sent in for admission and bladder irrigation.  Patient states he feels okay.  No other bleeding.  Did have history of prostate cancer.  No lightheadedness or dizziness.  He is home dialysis 4 days a week and would have been done today.  Patient has a history of getting Mircera injections for his anemia.  Has not previously had a blood transfusion.    Past Medical History:  Diagnosis Date  . Ambulates with cane    straight cane  . Cervical myelopathy ([place]) 02/06/2018  . Chronic kidney disease    dailysis M W F- home  . Complication of anesthesia   . Coronary artery disease   . Diabetes mellitus without complication ([place])    type2  . Diabetic foot ulcer ([place])   . Diabetic neuropathy ([place]) 02/06/2018  . Gait abnormality 08/24/2018  . GERD (gastroesophageal reflux disease)     01/06/20- not current  . Gout   . History of kidney stones    passed stones  . Hypercholesteremia   . Hypertension   . Hypothyroidism   . Neuromuscular disorder (HCC)    neuropathy left leg and bilateral feet  . Neuropathy   . PONV (postoperative nausea and vomiting)   . Prostate cancer ([place])   . PVD (peripheral vascular disease) ([place])    with amputations    Patient Active Problem List   Diagnosis Date Noted  . Gross  hematuria 06/16/2020  . Typical atrial flutter ([place]) 03/24/2020  . Bilateral pleural effusion 02/24/2020  . Healthcare maintenance 01/28/2020  . Shortness of breath 01/28/2020  . History of partial ray amputation of fourth toe of right foot ([place]) 09/08/2019  . Ulcerated, foot, right, with necrosis of bone ([place])   . Chronic cough 06/25/2019  . Cutaneous abscess of right foot   . Subacute osteomyelitis of right foot ([place]) 12/14/2018  . AKI (acute kidney injury) ([place]) 12/14/2018  . ESRD (end stage renal disease) ([place]) 12/14/2018  . Gait abnormality 08/24/2018  . Diabetic neuropathy ([place]) 02/06/2018  . Cervical myelopathy ([place]) 02/06/2018  . Onychomycosis 10/30/2017  . Other spondylosis with radiculopathy, cervical region 01/27/2017  . Midfoot ulcer, right, limited to breakdown of skin ([place]) 11/15/2016  . Lateral epicondylitis, left elbow 08/12/2016  . Cellulitis of fifth toe of right foot 07/29/2016  . Prostate cancer ([place]) 06/19/2016  . Non-pressure chronic ulcer of other part of right foot limited to breakdown of skin ([place]) 04/04/2016  . Cellulitis of leg, right 12/24/2015  . Cellulitis of right lower extremity 12/24/2015  . CKD (chronic kidney disease), stage III ([place]) 12/25/2013  . Foot infection 12/24/2013  . Foot ulcer, left ([place]) 12/24/2013  . Diabetic foot ulcer ([place]) 09/14/2012  . Gout 09/14/2012  . HTN (  hypertension) 09/14/2012  . Hypothyroidism 09/14/2012  . CAD (coronary artery disease) 09/14/2012  . Diabetes mellitus ([place]) 09/14/2012  . Hypercholesteremia   . Neuropathy [facility])     Past Surgical History:  Procedure Laterality Date  . A-FLUTTER ABLATION N/A 04/06/2020   Procedure: A-FLUTTER ABLATION;  Surgeon: [name], MD;  Location: [facility];  Service: Cardiovascular;  Laterality: N/A;  . AMPUTATION Left 12/25/2013   Procedure: AMPUTATION RAY LEFT 5TH RAY;  Surgeon: [name], MD;  Location: [facility];  Service: Orthopedics;  Laterality: Left;  .  AMPUTATION Right 12/15/2018   Procedure: AMPUTATION OF 4TH AND 5TH TOES RIGHT FOOT;  Surgeon: [name], MD;  Location: [facility];  Service: Orthopedics;  Laterality: Right;  . APPLICATION OF WOUND VAC Right 12/15/2018   Procedure: APPLICATION OF WOUND VAC;  Surgeon: [name], MD;  Location: [name];  Service: Orthopedics;  Laterality: Right;  . AV FISTULA PLACEMENT Left 03/25/2019   Procedure: LEFT ARM ARTERIOVENOUS (AV) FISTULA CREATION;  Surgeon: [name], MD;  Location: [facility];  Service: Vascular;  Laterality: Left;  . BACK SURGERY    . BASCILIC VEIN TRANSPOSITION Left 05/20/2019   Procedure: SECOND STAGE LEFT BASCILIC VEIN TRANSPOSITION;  Surgeon: [name], MD;  Location: [facility];  Service: Vascular;  Laterality: Left;  . CARDIAC CATHETERIZATION  02/17/2014  . CHOLECYSTECTOMY    . COLONOSCOPY  2011   in [place], [place] GRAFT  2008  . I & D EXTREMITY Right 12/15/2018   Procedure: DEBRIDEMENT RIGHT FOOT;  Surgeon: [name], MD;  Location: [facility];  Service: Orthopedics;  Laterality: Right;  . I & D EXTREMITY Right 08/27/2019   Procedure: PARTIAL CUBOID EXCISION RIGHT FOOT;  Surgeon: [name], MD;  Location: [facility];  Service: Orthopedics;  Laterality: Right;  . I & D EXTREMITY Right 01/07/2020   Procedure: RIGHT FOOT EXCISION INFECTED BONE;  Surgeon: [name], MD;  Location: [facility];  Service: Orthopedics;  Laterality: Right;  . NECK SURGERY     novemver 2019  . WISDOM TOOTH EXTRACTION         Family History  Problem Relation Age of Onset  . Diabetes Mellitus II Mother   . Kidney disease Mother   . Diabetes Mellitus II Father   . CAD Father   . Cancer Father        prostate  . Kidney disease Father   . Diabetes Mellitus II Brother   . Kidney disease Brother   . Diabetes Mellitus II Brother   . Stomach cancer Brother 110  . Kidney disease Brother   . Colon cancer Neg Hx   . Colon polyps Neg Hx   . Esophageal cancer Neg Hx    . Rectal cancer Neg Hx   . Pancreatic cancer Neg Hx     Social History   Tobacco Use  . Smoking status: Former Smoker    Years: 25.00    Types: Cigars    Start date: 04/08/1973    Quit date: 12/24/1988    Years since quitting: 31.4  . Smokeless tobacco: Never Used  . Tobacco comment: Cigars and Pipe   Vaping Use  . Vaping Use: Never used  Substance Use Topics  . Alcohol use: No  . Drug use: No    Home Medications Prior to Admission medications   Medication Sig Start Date End Date Taking? Authorizing Provider  acetaminophen (TYLENOL)  500 MG tablet Take 1,000-2,000 mg by mouth 2 (two) times daily as needed (pain).   Yes [provider]  allopurinol (ZYLOPRIM) 100 MG tablet Take 200 mg by mouth daily.  05/04/18  Yes [provider]  B Complex-C-Zn-Folic Acid (DIALYVITE 353 WITH ZINC) 0.8 MG TABS Take 1 tablet by mouth daily. 12/23/19  Yes [provider]  calcitRIOL (ROCALTROL) 0.5 MCG capsule Take 0.5 mcg by mouth daily.  08/11/19  Yes [provider]  carvedilol (COREG) 12.5 MG tablet Take 12.5 mg by mouth in the morning and at bedtime.   Yes [provider]  chlorhexidine (PERIDEX) 0.12 % solution 15 mLs by Mouth Rinse route daily. 12/07/19  Yes [provider]  diclofenac Sodium (VOLTAREN) 1 % GEL Apply 1 application topically 4 (four) times daily as needed (pain). 05/04/19  Yes [provider]  levothyroxine (SYNTHROID, LEVOTHROID) 112 MCG tablet Take 112 mcg by mouth daily before breakfast. BRAND NAME SYNTHROID   Yes [provider]  losartan (COZAAR) 100 MG tablet Take 100 mg by mouth daily.   Yes [provider]  Methoxy PEG-Epoetin Beta (MIRCERA IJ) Inject into the skin. 12/23/19  Yes [provider]  mupirocin cream (BACTROBAN) 2 % Apply 1 application topically daily as needed (wound care). 05/23/20  Yes [provider]  oxyCODONE-acetaminophen (PERCOCET/ROXICET) 5-325 MG tablet Take 1  tablet by mouth every 4 (four) hours as needed. Patient taking differently: Take 1 tablet by mouth every 4 (four) hours as needed for severe pain (pain). 01/07/20  Yes [name], MD  pantoprazole (PROTONIX) 40 MG tablet Take 1 tablet (40 mg total) by mouth daily. 05/11/20  Yes Irene Shipper, MD  Polyethyl Glycol-Propyl Glycol (LUBRICANT EYE DROPS) 0.4-0.3 % SOLN Place 1-2 drops into both eyes 3 (three) times daily as needed (dry/irritated eyes.).   Yes [provider]  pregabalin (LYRICA) 150 MG capsule Take 1 capsule (150 mg total) by mouth in the morning and at bedtime. 05/09/20  Yes Persons, [place]ely [place], [place]  PRESCRIPTION MEDICATION Apply 1 application topically daily as needed (psoriasis). Cream for psoriasis   Yes [provider]  prochlorperazine (COMPAZINE) 5 MG tablet Take 1 tablet (5 mg total) by mouth every 6 (six) hours as needed for nausea or vomiting. Patient taking differently: Take 5 mg by mouth 2 (two) times daily. 05/11/20  Yes Irene Shipper, MD  rosuvastatin (CRESTOR) 10 MG tablet Take 10 mg by mouth daily.    Yes [provider]  Semaglutide,0.25 or 0.5MG /DOS, (OZEMPIC, 0.25 OR 0.5 MG/DOSE,) 2 MG/1.5ML SOPN Inject 0.5 mg into the skin every Friday.   Yes [provider]  famotidine (PEPCID) 20 MG tablet TAKE 1 TABLET BY MOUTH AFTER SUPPER Patient not taking: Reported on 06/16/2020 06/15/20   Tanda Rockers, MD  Lancets (ONETOUCH DELICA PLUS [id number]) [place]  01/17/20   [provider]    Allergies    Mushroom extract complex  Review of Systems   Review of Systems  Constitutional: Negative for appetite change.  HENT: Negative for congestion.   Respiratory: Negative for shortness of breath.   Gastrointestinal: Negative for abdominal pain.  Genitourinary: Positive for hematuria.  Musculoskeletal: Negative for back pain.  Skin: Negative for rash.  Neurological: Negative for weakness.  Psychiatric/Behavioral: Negative for confusion.     Physical Exam Updated Vital Signs BP (!) 171/77 (BP Location: Right Arm)   Pulse 88   Temp 98.9 F (37.2 C) (Oral)   Resp 20  Ht 5\' 10"  (1.778 m)   Wt 95.3 kg   SpO2 98%   BMI 30.13 kg/m   Physical Exam Vitals and nursing note reviewed.  HENT:     Head: Atraumatic.     Mouth/Throat:     Mouth: Mucous membranes are moist.  Eyes:     Pupils: Pupils are equal, round, and reactive to light.  Cardiovascular:     Rate and Rhythm: Regular rhythm.  Pulmonary:     Breath sounds: No wheezing or rhonchi.  Abdominal:     Tenderness: There is no abdominal tenderness.  Genitourinary:    Comments: Foley catheter in place with blood in the tube and bag. Skin:    Capillary Refill: Capillary refill takes less than 2 seconds.  Neurological:     Mental Status: He is alert and oriented to person, place, and time.     ED Results / Procedures / Treatments   Labs (all labs ordered are listed, but only abnormal results are displayed) Labs Reviewed  BASIC METABOLIC PANEL - Abnormal; Notable for the following components:      Result Value   Glucose, Bld 140 (*)    BUN 45 (*)    Creatinine, Ser 5.59 (*)    GFR, Estimated 10 (*)    All other components within normal limits  CBC - Abnormal; Notable for the following components:   RBC 3.01 (*)    Hemoglobin 8.0 (*)    HCT 26.1 (*)    RDW 17.1 (*)    Platelets 128 (*)    All other components within normal limits  HEMOGLOBIN A1C - Abnormal; Notable for the following components:   Hgb A1c MFr Bld 5.7 (*)    All other components within normal limits  TSH - Abnormal; Notable for the following components:   TSH 4.878 (*)    All other components within normal limits  CBC - Abnormal; Notable for the following components:   RBC 2.77 (*)    Hemoglobin 7.5 (*)    HCT 23.5 (*)    RDW 17.1 (*)    Platelets 125 (*)    All other components within normal limits  GLUCOSE, CAPILLARY - Abnormal; Notable for the following components:    Glucose-Capillary 219 (*)    All other components within normal limits  SARS CORONAVIRUS 2 (TAT 6-24 HRS)  HIV ANTIBODY (ROUTINE TESTING W REFLEX)  BASIC METABOLIC PANEL  CBC  TYPE AND SCREEN    EKG None  Radiology No results found.  Procedures Procedures   Medications Ordered in ED Medications  allopurinol (ZYLOPRIM) tablet 200 mg (has no administration in time range)  oxyCODONE-acetaminophen (PERCOCET/ROXICET) 5-325 MG per tablet 1-2 tablet (2 tablets Oral Given 06/16/20 2219)  carvedilol (COREG) tablet 12.5 mg (has no administration in time range)  losartan (COZAAR) tablet 100 mg (has no administration in time range)  rosuvastatin (CRESTOR) tablet 10 mg (has no administration in time range)  calcitRIOL (ROCALTROL) capsule 0.5 mcg (has no administration in time range)  levothyroxine (SYNTHROID) tablet 112 mcg (has no administration in time range)  pantoprazole (PROTONIX) EC tablet 40 mg (has no administration in time range)  pregabalin (LYRICA) capsule 150 mg (150 mg Oral Given 06/16/20 2219)  chlorhexidine (PERIDEX) 0.12 % solution 15 mL (has no administration in time range)  polyvinyl alcohol (LIQUIFILM TEARS) 1.4 % ophthalmic solution 2 drop (has no administration in time range)  acetaminophen (TYLENOL) tablet 650 mg (has no administration in time range)    Or  acetaminophen (TYLENOL) suppository 650 mg (has no administration in time range)  zolpidem (AMBIEN) tablet 5 mg (has no administration in time range)  sorbitol 70 % solution 30 mL (has no administration in time range)  docusate sodium (ENEMEEZ) enema 283 mg (has no administration in time range)  ondansetron (ZOFRAN) tablet 4 mg (has no administration in time range)    Or  ondansetron (ZOFRAN) injection 4 mg (has no administration in time range)  camphor-menthol (SARNA) lotion 1 application (has no administration in time range)    And  hydrOXYzine (ATARAX/VISTARIL) tablet 25 mg (has no administration in time range)   calcium carbonate (dosed in mg elemental calcium) suspension 500 mg of elemental calcium (has no administration in time range)  feeding supplement (NEPRO CARB STEADY) liquid 237 mL (has no administration in time range)  morphine 2 MG/ML injection 2 mg (has no administration in time range)  docusate sodium (COLACE) capsule 100 mg (100 mg Oral Given 06/16/20 2219)  hydrALAZINE (APRESOLINE) injection 5 mg (5 mg Intravenous Given 06/16/20 2220)  insulin aspart (novoLOG) injection 0-6 Units (has no administration in time range)  belladonna-opium (B&O) suppository 16.2-60mg  (1 suppository Rectal Given 06/16/20 2324)  cefTRIAXone (ROCEPHIN) 1 g in sodium chloride 0.9 % 100 mL IVPB (1 g Intravenous New Bag/Given 06/16/20 2327)  sodium chloride irrigation 0.9 % 3,000 mL (3,000 mLs Irrigation New Bag/Given 06/16/20 2219)    ED Course  I have reviewed the triage vital signs and the nursing notes.  Pertinent labs & imaging results that were available during my care of the patient were reviewed by me and considered in my medical decision making (see chart for details).    MDM Rules/Calculators/A&P                          Patient with hematuria.  Hemoglobin is gone from 9.8-8 in a day.  Sent in reportedly from urologist for admission.  Will need continuous bladder irrigation.  Will also end up needing dialysis but does not appear to need it urgently.  Urology will see patient.  Admitted to medicine.  Potentially will need transfusion versus EPO infusion.   CRITICAL CARE Performed by: Davonna Belling Total critical care time: 30 minutes Critical care time was exclusive of separately billable procedures and treating other patients. Critical care was necessary to treat or prevent imminent or life-threatening deterioration. Critical care was time spent personally by me on the following activities: development of treatment plan with patient and/or surrogate as well as nursing, discussions with consultants,  evaluation of patient's response to treatment, examination of patient, obtaining history from patient or surrogate, ordering and performing treatments and interventions, ordering and review of laboratory studies, ordering and review of radiographic studies, pulse oximetry and re-evaluation of patient's condition.     ression(s) / ED Diagnoses Final diagnoses:  Hematuria, unspecified type  End stage renal disease on dialysis ([place])  Acute blood loss anemia    Rx / DC Orders ED Discharge Orders    None       Davonna Belling, MD 06/16/20 2330

## 2020-06-16 NOTE — ED Notes (Signed)
Foley irrigated to the point where clots are no longer coming out of foley. Return fluid pinkish red w/ minimal clotting after irrigation.

## 2020-06-16 NOTE — Procedures (Addendum)
Procedure: Complicated foley catheter placement CPT Code: 54562  Indication: Gross hematuria c/b clotted foley catheter. Please refer to separate consult note for more details.  Procedure Details:  The patient was seen in his ED room. His current 2-way 24 Fr foley catheter was removed given that it no longer irrigated easily and wasn't draining, c/f clot obstruction.   I prepped and draped the patient in the typical sterile fashion. I attempted to insert a 24 Fr then 20 Fr 3-way hematuria catheter; however, I was unable to advance either catheter more than 1cm into the urethra due to significant urethral narrowing. Thus, I advanced a straight glidewire per the urethra and into the bladder based off feel. I then advanced a 5 Fr open-ended catheter over the glidewire into the bladder. I removed the glidewire leaving open-ended catheter in place. I then slowly pulling out the open-ended catheter while aspirating on the end until I got return of cherry-colored urine, confirming that it was in the bladder. I advanced a sensor wire through the open-ended catheter and removed the open-ended catheter leaving wire in place.   I then sequentially dilated the urethra to 26 Fr using Heyman Bard dilators over the sensor wire. Of note, the penile urethra was very tight. I then attempted to pass the 24 Fr 3-way hematuria catheter over the wire, but I was unable to advance it more than 2cm into the urethra. I switched to the 20 Fr 3-way hematuria catheter, which I was able to successfully advance up to the hub over the wire with return of cherry urine. I removed the indwelling sensor wire and filled the foley balloon with 30cc sterile water. I irrigated the foley with ~200cc NS without return of clot. I connected the foley to CBI. The foley was draining cherry colored urine on fast drip CBI.  Recommendations: - Please refer to separate consult note   I have seen and examined the patient and agree with the above  assessment and plan. I was available for procedure.  Matt R. [facility]  Pager: [phone]

## 2020-06-16 NOTE — H&P (Signed)
History and Physical    [name] SEG:[id number] DOB: 01-31-1951 DOA: 06/16/2020  PCP: Lorenda Hatchet, FNP Consultants:  Melvyn Novas - pulmonology; [name] - orthopedics; Henrene Pastor - GI; [name]- cardiology; Manandhar - ID; nephrology; [place] - urology Patient coming from:  Home - lives with wife, son and his partner, and grandson; NOK: Wife, [name], [phone]   Chief Complaint: Hematuria  HPI: [name] is a 70 y.o. male with medical history significant of prostate CA; hypothyroidism; HTN; HLD; DM; and ESRD on home [facility] HD presenting with hematuria.  He has been having gross hematuria for "a while now", since last week.  He saw the urology PA but it has gotten significantly worse over the last few days.  Significant clotting.  He saw Dr. [name] today and was unable to pass it due to clot.  He thinks that if he can get all the blood out of the bladder then it will resolve.  He started that today but recommended admission.  He has been unable to sleep for the last 2 nights due to urgency and inability to void.  His wife bought Depends and that helped a little.  No SOB.  Not light-headed or dizzy.    ED Course: Urology plans to irrigate and consult - hematuria, Hgb 8.  Has never had a transfusion. Gets home HD but no urgent need.  Unable to do cystoscopy today.  Review of Systems: As per HPI; otherwise review of systems reviewed and negative.   Ambulatory Status:  Ambulates with a walker  COVID Vaccine Status:  Complete plus booster  Past Medical History:  Diagnosis Date  . Ambulates with cane    straight cane  . Cervical myelopathy ([place]) 02/06/2018  . Chronic kidney disease    dailysis M W F- home  . Complication of anesthesia   . Coronary artery disease   . Diabetes mellitus without complication ([place])    type2  . Diabetic foot ulcer ([place])   . Diabetic neuropathy ([place]) 02/06/2018  . Gait abnormality 08/24/2018  . GERD (gastroesophageal reflux disease)     01/06/20- not current   . Gout   . History of kidney stones    passed stones  . Hypercholesteremia   . Hypertension   . Hypothyroidism   . Neuromuscular disorder (HCC)    neuropathy left leg and bilateral feet  . Neuropathy   . PONV (postoperative nausea and vomiting)   . Prostate cancer ([place])   . PVD (peripheral vascular disease) ([place])    with amputations    Past Surgical History:  Procedure Laterality Date  . A-FLUTTER ABLATION N/A 04/06/2020   Procedure: A-FLUTTER ABLATION;  Surgeon: [name], MD;  Location: [facility];  Service: Cardiovascular;  Laterality: N/A;  . AMPUTATION Left 12/25/2013   Procedure: AMPUTATION RAY LEFT 5TH RAY;  Surgeon: [name], MD;  Location: [facility];  Service: Orthopedics;  Laterality: Left;  . AMPUTATION Right 12/15/2018   Procedure: AMPUTATION OF 4TH AND 5TH TOES RIGHT FOOT;  Surgeon: [name], MD;  Location: [facility];  Service: Orthopedics;  Laterality: Right;  . APPLICATION OF WOUND VAC Right 12/15/2018   Procedure: APPLICATION OF WOUND VAC;  Surgeon: [name], MD;  Location: [facility];  Service: Orthopedics;  Laterality: Right;  . AV FISTULA PLACEMENT Left 03/25/2019   Procedure: LEFT ARM ARTERIOVENOUS (AV) FISTULA CREATION;  Surgeon: [name], MD;  Location: [facility];  Service: Vascular;  Laterality: Left;  . BACK SURGERY    .  BASCILIC VEIN TRANSPOSITION Left 05/20/2019   Procedure: SECOND STAGE LEFT BASCILIC VEIN TRANSPOSITION;  Surgeon: [name], MD;  Location: [facility];  Service: Vascular;  Laterality: Left;  . CARDIAC CATHETERIZATION  02/17/2014  . CHOLECYSTECTOMY    . COLONOSCOPY  2011   in [place], [place] GRAFT  2008  . I & D EXTREMITY Right 12/15/2018   Procedure: DEBRIDEMENT RIGHT FOOT;  Surgeon: [name], MD;  Location: [facility];  Service: Orthopedics;  Laterality: Right;  . I & D EXTREMITY Right 08/27/2019   Procedure: PARTIAL CUBOID EXCISION RIGHT FOOT;  Surgeon: [name], MD;  Location: [facility];  Service: Orthopedics;  Laterality: Right;  . I & D EXTREMITY Right 01/07/2020   Procedure: RIGHT FOOT EXCISION INFECTED BONE;  Surgeon: [name], MD;  Location: [facility];  Service: Orthopedics;  Laterality: Right;  . NECK SURGERY     novemver 2019  . WISDOM TOOTH EXTRACTION      Social History   Socioeconomic History  . Marital status: Married    Spouse name: Not on file  . Number of children: 2  . Years of education: Not on file  . Highest education level: Not on file  Occupational History  . 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 Smoking status: Former Smoker    Years: 25.00    Types: Cigars    Start date: 04/08/1973    Quit date: 12/24/1988    Years since quitting: 31.4  . Smokeless tobacco: Never Used  . Tobacco comment: Cigars and Pipe   Vaping Use  . Vaping Use: Never used  Substance and Sexual Activity  . Alcohol use: No  . Drug use: No  . Sexual activity: Yes    Partners: Female  Other Topics Concern  . Not on file  Social History Narrative   Regular exercise: yes 3 times a week   Caffeine use: hot tea   Social Determinants of Health   Financial Resource Strain: Not on file  Food Insecurity: Not on file  Transportation Needs: Not on file  Physical Activity: Not on file  Stress: Not on file  Social Connections: Not on file  Intimate Partner Violence: Not on file    Allergies  Allergen Reactions  . Mushroom Extract Complex Nausea Only    Family History  Problem Relation Age of Onset  . Diabetes Mellitus II Mother   . Kidney disease Mother   . Diabetes Mellitus II Father   . CAD Father   . Cancer Father        prostate  . Kidney disease Father   . Diabetes Mellitus II Brother   . Kidney disease Brother   . Diabetes Mellitus II Brother   . Stomach cancer Brother 68  . Kidney disease Brother   . Colon cancer Neg Hx   . Colon polyps Neg Hx   . Esophageal cancer Neg Hx   . Rectal cancer Neg Hx   . Pancreatic cancer Neg Hx      Prior to Admission medications   Medication Sig Start Date End Date Taking? Authorizing Provider  acetaminophen (TYLENOL) 500 MG tablet Take 1,500 mg by mouth every 8 (eight) hours as needed for moderate pain.    [provider]  allopurinol (ZYLOPRIM) 100 MG tablet Take 200 mg by mouth daily.  05/04/18   [provider]  B Complex-C-Zn-Folic Acid (DIALYVITE 440 WITH ZINC) 0.8 MG TABS Take  1 tablet by mouth daily. 12/23/19   [provider]  calcitRIOL (ROCALTROL) 0.5 MCG capsule Take 0.5 mcg by mouth daily.  08/11/19   [provider]  carvedilol (COREG) 12.5 MG tablet Take 12.5 mg by mouth in the morning and at bedtime.    [provider]  chlorhexidine (PERIDEX) 0.12 % solution 15 mLs by Mouth Rinse route daily. 12/07/19   [provider]  diclofenac Sodium (VOLTAREN) 1 % GEL Apply 1 application topically 4 (four) times daily as needed (pain). 05/04/19   [provider]  famotidine (PEPCID) 20 MG tablet TAKE 1 TABLET BY MOUTH AFTER SUPPER 06/15/20   Tanda Rockers, MD  Lancets (ONETOUCH DELICA PLUS [id number]) [place]  01/17/20   [provider]  levothyroxine (SYNTHROID, LEVOTHROID) 112 MCG tablet Take 112 mcg by mouth daily before breakfast. BRAND NAME SYNTHROID    [provider]  losartan (COZAAR) 100 MG tablet Take 100 mg by mouth daily.    [provider]  Methoxy PEG-Epoetin Beta (MIRCERA IJ) Inject into the skin. 12/23/19   [provider]  oxyCODONE-acetaminophen (PERCOCET/ROXICET) 5-325 MG tablet Take 1 tablet by mouth every 4 (four) hours as needed. 01/07/20   [name], MD  pantoprazole (PROTONIX) 40 MG tablet Take 1 tablet (40 mg total) by mouth daily. 05/11/20   Irene Shipper, MD  Polyethyl Glycol-Propyl Glycol (LUBRICANT EYE DROPS) 0.4-0.3 % SOLN Place 1-2 drops into both eyes 3 (three) times daily as needed (dry/irritated eyes.).    [provider]  pregabalin (LYRICA) 150 MG  capsule Take 1 capsule (150 mg total) by mouth in the morning and at bedtime. 05/09/20   Persons, Bevely Palmer, PA  prochlorperazine (COMPAZINE) 5 MG tablet Take 1 tablet (5 mg total) by mouth every 6 (six) hours as needed for nausea or vomiting. 05/11/20   Irene Shipper, MD  rosuvastatin (CRESTOR) 10 MG tablet Take 10 mg by mouth daily.     [provider]  Semaglutide,0.25 or 0.5MG /DOS, (OZEMPIC, 0.25 OR 0.5 MG/DOSE,) 2 MG/1.5ML SOPN Inject 0.5 mg into the skin every Friday.    [provider]    Physical Exam: Vitals:   06/16/20 1448 06/16/20 1620 06/16/20 1641  BP: (!) 147/65 (!) 152/72   Pulse: 84 85   Resp: 18 14   Temp: 98.8 F (37.1 C)    SpO2: 99% 99%   Weight:   95.3 kg  Height:   5\' 10"  (1.778 m)     . General:  Appears calm but mildly uncomfortable and is in NAD . Eyes:  PERRL, EOMI, normal lids, iris . ENT:  grossly normal hearing, lips & tongue, mmm . Neck:  no LAD, masses or thyromegaly . Cardiovascular:  RRR, no m/r/g. No LE edema.  Marland Kitchen Respiratory:   CTA bilaterally with no wheezes/rales/rhonchi.  Normal respiratory effort. . Abdomen:  soft, NT, ND . Skin:  no rash or induration seen on limited exam . Musculoskeletal:  grossly normal tone BUE/BLE, good ROM, no bony abnormality . Lower extremity:  No LE edema.  Limited foot exam with no ulcerations; s/p L 5th toe amputation and R 4/5 toe amputations with B calluses at base of 5th metatarsal.  2+ distal pulses. Marland Kitchen Psychiatric:  grossly normal mood and affect, speech fluent and appropriate, AOx3 . Neurologic:  CN 2-12 grossly intact, moves all extremities in coordinated fashion    Radiological Exams on Admission: Independently reviewed - see discussion in A/P where applicable  No  results found.  EKG: not done   Labs on Admission: I have personally reviewed the available labs and imaging studies at the time of the admission.  Pertinent labs:   Glucose 140 BUN 45/[place] 5.59/GFR 10 WBC 8.0; 9.8  on 06/15/20 Platelets 128 - stable   Assessment/Plan Principal Problem:   Gross hematuria Active Problems:   Hypercholesteremia   HTN (hypertension)   Hypothyroidism   Diabetes mellitus (HCC)   ESRD (end stage renal disease) (HCC)   Gross hematuria -Patient with acute onset of hematuria about a week ago, now significantly worse and with clots -He was seen by the urology PA earlier this week -He was seen again by urology today (Dr. [name]) and had foley placed -He does still make some urine daily -Will need foley irrigation -May need cystoscopy -Will admit to Med Surg for ongoing treatment -Dr. Sheppard Coil from urology is seeing him now  ESRD on home HD -Patient on chronic MWThS home HD -Nephrology prn order set utilized -He does not appear to be volume overloaded or otherwise in need of acute HD -Dr. Tonna Corner was notified by EDP that patient will need HD -Continue Allopurinol, Calcitriol  Anemia -Patient has baseline iron deficiency anemia/anemia associated with CKD -He receives Mircera injections -His Hgb decreased from 9.8 yesterday to 8.0 today.  -Type and screen ordered -Will recheck CBC at 2200 and 0500 -Transfuse as necessary for Hbg <7 -He has been consented for blood products and agrees to receive, if needed  Prostate CA -Completed external beam radiation in 2018  Hypothyroidism -Check TSH -Continue Synthroid at current dose for now  HTN -Continue Coreg, Cozaar  HLD -Continue Crestor  DM -Will check A1c -hold Ozempic -Cover with very sensitive-scale SSI     Note: This patient has been tested and is pending for the novel coronavirus COVID-19. The patient has been fully vaccinated against COVID-19.   Level of care: Med-Surg DVT prophylaxis:  SCDs Code Status:  Full - confirmed with patient Family Communication: None present Disposition Plan:  The patient is from: home  Anticipated d/c is to: home without [facility] services  Anticipated d/c  date will depend on clinical response to treatment, likely 2-3 days  Patient is currently: acutely ill Consults called: Urology; Nephrology; Nutrition Admission status:  Admit - It is my clinical opinion that admission to INPATIENT is reasonable and necessary because of the expectation that this patient will require hospital care that crosses at least 2 midnights to treat this condition based on the medical complexity of the problems presented.  Given the aforementioned information, the predictability of an adverse outcome is felt to be significant.    Karmen Bongo MD [facility]   How to contact the [facility] Attending or Consulting provider [place] or covering provider during after hours [place], for this patient?  1. Check the care team in [facility] and look for a) attending/consulting TR[facility] provider listed and b) the [facility] team listed 2. Log into [email or web address] and use [place]'s universal password to access. If you do not have the password, please contact the hospital operator. 3. Locate the [facility] provider you are looking for under [facility] and page to a number that you can be directly reached. 4. If you still have difficulty reaching the provider, please page the [facility] (Director on Call) for the Hospitalists listed on amion for assistance.   06/16/2020, 5:46 PM

## 2020-06-17 ENCOUNTER — Encounter (HOSPITAL_COMMUNITY)
Admission: EM | Disposition: A | Discharge status: Home / Self Care | Payer: Self-pay | Source: Home / Self Care | Attending: Internal Medicine

## 2020-06-17 ENCOUNTER — Inpatient Hospital Stay (HOSPITAL_COMMUNITY): Payer: Medicare Other | Admitting: Certified Registered"

## 2020-06-17 ENCOUNTER — Inpatient Hospital Stay (HOSPITAL_COMMUNITY): Payer: Medicare Other

## 2020-06-17 DIAGNOSIS — E11621 Type 2 diabetes mellitus with foot ulcer: Secondary | ICD-10-CM | POA: Diagnosis not present

## 2020-06-17 DIAGNOSIS — R310 Gross hematuria: Secondary | ICD-10-CM | POA: Diagnosis not present

## 2020-06-17 DIAGNOSIS — N186 End stage renal disease: Secondary | ICD-10-CM | POA: Diagnosis not present

## 2020-06-17 DIAGNOSIS — I10 Essential (primary) hypertension: Secondary | ICD-10-CM | POA: Diagnosis not present

## 2020-06-17 HISTORY — PX: CYSTOSCOPY WITH FULGERATION: SHX6638

## 2020-06-17 LAB — HEPATITIS B SURFACE ANTIGEN: Hepatitis B Surface Ag: NONREACTIVE

## 2020-06-17 LAB — CBC
HCT: 24.7 % — ABNORMAL LOW (ref 39.0–52.0)
Hemoglobin: 7.5 g/dL — ABNORMAL LOW (ref 13.0–17.0)
MCH: 26.4 pg (ref 26.0–34.0)
MCHC: 30.4 g/dL (ref 30.0–36.0)
MCV: 87 fL (ref 80.0–100.0)
Platelets: 125 10*3/uL — ABNORMAL LOW (ref 150–400)
RBC: 2.84 MIL/uL — ABNORMAL LOW (ref 4.22–5.81)
RDW: 17.3 % — ABNORMAL HIGH (ref 11.5–15.5)
WBC: 6.6 10*3/uL (ref 4.0–10.5)
nRBC: 0 % (ref 0.0–0.2)

## 2020-06-17 LAB — PREPARE RBC (CROSSMATCH)

## 2020-06-17 LAB — BASIC METABOLIC PANEL
Anion gap: 12 (ref 5–15)
BUN: 53 mg/dL — ABNORMAL HIGH (ref 8–23)
CO2: 25 mmol/L (ref 22–32)
Calcium: 9.1 mg/dL (ref 8.9–10.3)
Chloride: 103 mmol/L (ref 98–111)
Creatinine, Ser: 5.78 mg/dL — ABNORMAL HIGH (ref 0.61–1.24)
GFR, Estimated: 10 mL/min — ABNORMAL LOW (ref >60)
Glucose, Bld: 119 mg/dL — ABNORMAL HIGH (ref 70–99)
Potassium: 4.6 mmol/L (ref 3.5–5.1)
Sodium: 140 mmol/L (ref 135–145)

## 2020-06-17 LAB — GLUCOSE, CAPILLARY
Glucose-Capillary: 106 mg/dL — ABNORMAL HIGH (ref 70–99)
Glucose-Capillary: 110 mg/dL — ABNORMAL HIGH (ref 70–99)
Glucose-Capillary: 113 mg/dL — ABNORMAL HIGH (ref 70–99)
Glucose-Capillary: 221 mg/dL — ABNORMAL HIGH (ref 70–99)

## 2020-06-17 LAB — SARS CORONAVIRUS 2 (TAT 6-24 HRS): SARS Coronavirus 2: NEGATIVE

## 2020-06-17 SURGERY — CYSTOSCOPY, WITH BLADDER FULGURATION
Anesthesia: General | Site: Bladder | Laterality: Bilateral

## 2020-06-17 MED ORDER — ALBUMIN HUMAN 5 % IV SOLN: INTRAVENOUS | Status: DC | PRN

## 2020-06-17 MED ORDER — SODIUM CHLORIDE 0.9% IV SOLUTION: Freq: Once | INTRAVENOUS | Status: AC

## 2020-06-17 MED ORDER — ONDANSETRON HCL 4 MG/2ML IJ SOLN
INTRAMUSCULAR | Status: DC | PRN
  Administered 2020-06-17: 4 mg via INTRAVENOUS

## 2020-06-17 MED ORDER — DEXAMETHASONE SODIUM PHOSPHATE 4 MG/ML IJ SOLN
INTRAMUSCULAR | Status: DC | PRN
  Administered 2020-06-17: 5 mg via INTRAVENOUS

## 2020-06-17 MED ORDER — 0.9 % SODIUM CHLORIDE (POUR BTL) OPTIME
TOPICAL | Status: DC | PRN
  Administered 2020-06-17: 1000 mL

## 2020-06-17 MED ORDER — SODIUM CHLORIDE 0.9 % IV SOLN: INTRAVENOUS | Status: DC

## 2020-06-17 MED ORDER — CHLORHEXIDINE GLUCONATE CLOTH 2 % EX PADS
6.0000 | MEDICATED_PAD | Freq: Every day | CUTANEOUS | Status: DC
  Administered 2020-06-17 – 2020-06-22 (×6): 6 via TOPICAL

## 2020-06-17 MED ORDER — SODIUM CHLORIDE 0.9 % IV SOLN: INTRAVENOUS | Status: DC | PRN

## 2020-06-17 MED ORDER — LIDOCAINE 2% (20 MG/ML) 5 ML SYRINGE
INTRAMUSCULAR | Status: DC | PRN
  Administered 2020-06-17: 60 mg via INTRAVENOUS

## 2020-06-17 MED ORDER — FENTANYL CITRATE (PF) 250 MCG/5ML IJ SOLN
INTRAMUSCULAR | Status: AC
  Filled 2020-06-17: qty 5

## 2020-06-17 MED ORDER — PROPOFOL 10 MG/ML IV BOLUS
INTRAVENOUS | Status: AC
  Filled 2020-06-17: qty 20

## 2020-06-17 MED ORDER — CEFAZOLIN SODIUM-DEXTROSE 2-3 GM-%(50ML) IV SOLR
INTRAVENOUS | Status: DC | PRN
  Administered 2020-06-17: 2 g via INTRAVENOUS

## 2020-06-17 MED ORDER — IOHEXOL 300 MG/ML  SOLN
INTRAMUSCULAR | Status: DC | PRN
  Administered 2020-06-17: 17 mL via URETHRAL

## 2020-06-17 MED ORDER — PROPOFOL 10 MG/ML IV BOLUS
INTRAVENOUS | Status: DC | PRN
  Administered 2020-06-17: 120 mg via INTRAVENOUS

## 2020-06-17 MED ORDER — STERILE WATER FOR IRRIGATION IR SOLN
Status: DC | PRN
  Administered 2020-06-17: 1000 mL

## 2020-06-17 MED ORDER — FENTANYL CITRATE (PF) 100 MCG/2ML IJ SOLN: 25.0000 ug | INTRAMUSCULAR | Status: DC | PRN

## 2020-06-17 MED ORDER — CHLORHEXIDINE GLUCONATE CLOTH 2 % EX PADS
6.0000 | MEDICATED_PAD | Freq: Every day | CUTANEOUS | Status: DC
  Administered 2020-06-21 – 2020-06-28 (×4): 6 via TOPICAL

## 2020-06-17 MED ORDER — PHENYLEPHRINE HCL (PRESSORS) 10 MG/ML IV SOLN
INTRAVENOUS | Status: DC | PRN
  Administered 2020-06-17 (×2): 80 ug via INTRAVENOUS
  Administered 2020-06-17 (×2): 40 ug via INTRAVENOUS

## 2020-06-17 MED ORDER — FENTANYL CITRATE (PF) 100 MCG/2ML IJ SOLN
INTRAMUSCULAR | Status: DC | PRN
  Administered 2020-06-17 (×3): 25 ug via INTRAVENOUS

## 2020-06-17 MED ORDER — SODIUM CHLORIDE 0.9 % IR SOLN
Status: DC | PRN
  Administered 2020-06-17 (×3): 3000 mL

## 2020-06-17 SURGICAL SUPPLY — 25 items
ADAPTER CATH URET PLST 4-6FR (CATHETERS) ×1 IMPLANT
BAG URINE DRAIN 2000ML AR STRL (UROLOGICAL SUPPLIES) ×2 IMPLANT
BAG URO CATCHER STRL LF (MISCELLANEOUS) ×2 IMPLANT
CATH FOLEY 2WAY 5CC 16FR (CATHETERS)
CATH HEMA 3WAY 30CC 24FR COUDE (CATHETERS) ×1 IMPLANT
CATH URET 5FR 28IN OPEN ENDED (CATHETERS) ×1 IMPLANT
CATH URTH STD 16FR FL 2W DRN (CATHETERS) ×1 IMPLANT
DRAPE C-ARM 42X72 X-RAY (DRAPES) ×1 IMPLANT
GLOVE BIOGEL M 7.0 STRL (GLOVE) ×2 IMPLANT
GOWN STRL REUS W/TWL LRG LVL3 (GOWN DISPOSABLE) ×2 IMPLANT
GUIDEWIRE ANG ZIPWIRE 038X150 (WIRE) IMPLANT
GUIDEWIRE STR DUAL SENSOR (WIRE) ×4 IMPLANT
IV NS 1000ML (IV SOLUTION)
IV NS 1000ML BAXH (IV SOLUTION) ×1 IMPLANT
KIT TURNOVER KIT A (KITS) IMPLANT
LOOP CUT BIPOLAR 24F LRG (ELECTROSURGICAL) ×1 IMPLANT
MANIFOLD NEPTUNE II (INSTRUMENTS) ×2 IMPLANT
PACK CYSTO (CUSTOM PROCEDURE TRAY) ×2 IMPLANT
PLUG CATH AND CAP STER (CATHETERS) ×1 IMPLANT
STENT CONTOUR 6FRX24X.038 (STENTS) IMPLANT
STENT CONTOUR 6FRX26X.038 (STENTS) ×1 IMPLANT
SYR 30ML LL (SYRINGE) ×1 IMPLANT
SYR TOOMEY IRRIG 70ML (MISCELLANEOUS) ×2
SYRINGE TOOMEY IRRIG 70ML (MISCELLANEOUS) IMPLANT
TUBING CONNECTING 10 (TUBING) ×2 IMPLANT

## 2020-06-17 NOTE — Transfer of Care (Signed)
Immediate Anesthesia Transfer of Care Note  Patient: [name]  Procedure(s) Performed: CYSTOSCOPY,BILATERAL RETROGRADE, CLOT EVACUATION WITH FULGERATION OF THE BLADDER (Bilateral Bladder)  Patient Location: PACU  Anesthesia Type:General  Level of Consciousness: awake, alert  and oriented  Airway & Oxygen Therapy: Patient Spontanous Breathing  Post-op Assessment: Report given to RN and Post -op Vital signs reviewed and stable  Post vital signs: Reviewed and stable  Last Vitals:  Vitals Value Taken Time  BP 117/65 06/17/20 1347  Temp 36.1 C 06/17/20 1345  Pulse 71 06/17/20 1351  Resp 12 06/17/20 1351  SpO2 100 % 06/17/20 1351  Vitals shown include unvalidated device data.  Last Pain:  Vitals:   06/17/20 1345  TempSrc:   PainSc: 5       Patients Stated Pain Goal: 0 (62/56/38 9373)  Complications: No complications documented.

## 2020-06-17 NOTE — Progress Notes (Signed)
Day of Surgery Subjective: Pain controlled. No nausea. Tolerating foley. Light pink tinge.  Objective: Vital signs in last 24 hours: Temp:  [98.2 F (36.8 C)-98.9 F (37.2 C)] 98.2 F (36.8 C) (03/12 0923) Pulse Rate:  [42-94] 79 (03/12 0923) Resp:  [13-27] 18 (03/12 0923) BP: (145-173)/(60-85) 145/66 (03/12 0923) SpO2:  [90 %-100 %] 100 % (03/12 0923) Weight:  [95.3 kg] 95.3 kg (03/11 1641)  Intake/Output from previous day: 03/11 0701 - 03/12 0700 In: 12240 [P.O.:240] Out: 15300 [Urine:15300] Intake/Output this shift: Total I/O In: 2450 [Other:2450] Out: 3500 [Urine:3500]  Physical Exam:  General: Alert and oriented CV: RRR Lungs: Clear Abdomen: Soft, ND, NT Ext: NT, No erythema  Lab Results: Recent Labs    06/16/20 1456 06/16/20 2210 06/17/20 0526  HGB 8.0* 7.5* 7.5*  HCT 26.1* 23.5* 24.7*   BMET Recent Labs    06/16/20 1456 06/17/20 0526  NA 139 140  K 4.1 4.6  CL 100 103  CO2 29 25  GLUCOSE 140* 119*  BUN 45* 53*  CREATININE 5.59* 5.78*  CALCIUM 9.4 9.1     Studies/Results: US PELVIS LIMITED (TRANSABDOMINAL ONLY)  Result Date: 06/16/2020 CLINICAL DATA:  Blood clot can bladder, continuous bladder irrigation EXAM: LIMITED ULTRASOUND OF PELVIS TECHNIQUE: Limited transabdominal ultrasound examination of the pelvis was performed. COMPARISON:  None. FINDINGS: Sonographic evaluation of the bladder was performed. Foley catheter is seen within the urinary bladder. There is a 6.8 x 7.3 x 6.1 cm blood clot surrounding the Foley catheter balloon. No other filling defects within the bladder. IMPRESSION: 1. Large clot surrounding the Foley catheter as above. Electronically Signed   By: Randa Ngo M.D.   On: 06/16/2020 23:29    Assessment/Plan: 1. Radiation cystitis 2. Clot retention 3. Gross hematuria  -Irrigated minimal clot at bedside. Irrigation is light pink tinge. -Bladder ultrasound with approximately 7x7x7cm well formed clot -Recommend OR for  cysto, b/l RPG, clot evacuation, fulguration of bladder -Will plan for CBI after procedure   LOS: 1 day   Matt R. Jocelin Schuelke MD 06/17/2020, 10:51 AM [facility]  Pager: [phone]

## 2020-06-17 NOTE — Progress Notes (Signed)
Patient reported pain on arrival to Short Stay. Foley noted not to be draining. Milked tubing and cleared a clot from tubing. Foley started draining again. Patient reports that pain is resolving.

## 2020-06-17 NOTE — Interval H&P Note (Signed)
History and Physical Interval Note:  06/17/2020 10:54 AM  [name]  has presented today for surgery, with the diagnosis of bleeding in the bladder.  The various methods of treatment have been discussed with the patient and family. After consideration of risks, benefits and other options for treatment, the patient has consented to  Procedure(s): CYSTOSCOPY,BILATERAL RETROGRADE, [place] (Bilateral) as a surgical intervention.  The patient's history has been reviewed, patient examined, no change in status, stable for surgery.  I have reviewed the patient's chart and labs.  Questions were answered to the patient's satisfaction.     [name]

## 2020-06-17 NOTE — Anesthesia Procedure Notes (Signed)
Procedure Name: LMA Insertion Date/Time: 06/17/2020 12:33 PM Performed by: Clearnce Sorrel, CRNA Pre-anesthesia Checklist: Patient identified, Emergency Drugs available, Suction available, Patient being monitored and Timeout performed Patient Re-evaluated:Patient Re-evaluated prior to induction Oxygen Delivery Method: Circle system utilized Preoxygenation: Pre-oxygenation with 100% oxygen Induction Type: IV induction LMA: LMA inserted LMA Size: 5.0 Number of attempts: 1 Placement Confirmation: positive ETCO2 and breath sounds checked- equal and bilateral Tube secured with: Tape Dental Injury: Teeth and Oropharynx as per pre-operative assessment

## 2020-06-17 NOTE — Consult Note (Addendum)
[facility] Renal Consultation Note    Indication for Consultation:  Management of ESRD/hemodialysis; anemia, hypertension/volume and secondary hyperparathyroidism   HPI: [name] is a 70 y.o. male with ESRD on home HD. PMH significant for DMT2, HTN, CAD,  hypothyroidism, prostate cancer s/p radiation therapy. He is admitted for evaluation of hematuria x 1 week. Was seen by urology earlier this week and had unsuccessful cystourethroscopy. Outpatient urine cx were negative. Instructed to present to ED with worsening hematuria and difficulty voiding. Urology consulted with plans for intervention in OR today. Labs: Na 140, K 4.6 BUN 53 Cr 5.78, WBC 6.6 Hgb 7.5   Nephrology asked to see for dialysis needs. He missed his last HD d/t hospital admission.  No issues with dialysis at home. Dialysis via AVF with buttonholes.   Seen and examined at bedside. His biggest complaint is being hungry. Denies f,c, cp, sob, n/v, edema.    Past Medical History:  Diagnosis Date  . Ambulates with cane    straight cane  . Cervical myelopathy ([place]) 02/06/2018  . Chronic kidney disease    dailysis M W F- home  . Complication of anesthesia   . Coronary artery disease   . Diabetes mellitus without complication ([place])    type2  . Diabetic foot ulcer ([place])   . Diabetic neuropathy ([place]) 02/06/2018  . Gait abnormality 08/24/2018  . GERD (gastroesophageal reflux disease)     01/06/20- not current  . Gout   . History of kidney stones    passed stones  . Hypercholesteremia   . Hypertension   . Hypothyroidism   . Neuromuscular disorder (H[facility])    neuropathy left leg and bilateral feet  . Neuropathy   . PONV (postoperative nausea and vomiting)   . Prostate cancer ([place])   . PVD (peripheral vascular disease) ([place])    with amputations   Past Surgical History:  Procedure Laterality Date  . A-FLUTTER ABLATION N/A 04/06/2020   Procedure: A-FLUTTER ABLATION;  Surgeon: [name], MD;  Location: [place] [facility];  Service: Cardiovascular;  Laterality: N/A;  . AMPUTATION Left 12/25/2013   Procedure: AMPUTATION RAY LEFT 5TH RAY;  Surgeon: [name], MD;  Location: [facility];  Service: Orthopedics;  Laterality: Left;  . AMPUTATION Right 12/15/2018   Procedure: AMPUTATION OF 4TH AND 5TH TOES RIGHT FOOT;  Surgeon: [name], MD;  Location: [place];  Service: Orthopedics;  Laterality: Right;  . APPLICATION OF WOUND VAC Right 12/15/2018   Procedure: APPLICATION OF WOUND VAC;  Surgeon: [name], MD;  Location: [place];  Service: Orthopedics;  Laterality: Right;  . AV FISTULA PLACEMENT Left 03/25/2019   Procedure: LEFT ARM ARTERIOVENOUS (AV) FISTULA CREATION;  Surgeon: [name], MD;  Location: [place];  Service: Vascular;  Laterality: Left;  . BACK SURGERY    . BASCILIC VEIN TRANSPOSITION Left 05/20/2019   Procedure: SECOND STAGE LEFT BASCILIC VEIN TRANSPOSITION;  Surgeon: [name], MD;  Location: [place];  Service: Vascular;  Laterality: Left;  . CARDIAC CATHETERIZATION  02/17/2014  . CHOLECYSTECTOMY    . COLONOSCOPY  2011   in [place], [place] GRAFT  2008  . I & D EXTREMITY Right 12/15/2018   Procedure: DEBRIDEMENT RIGHT FOOT;  Surgeon: [name], MD;  Location: [place];  Service: Orthopedics;  Laterality: Right;  . I & D EXTREMITY Right 08/27/2019   Procedure: PARTIAL CUBOID EXCISION RIGHT FOOT;  Surgeon: Meridee Score  V, MD;  Location: [place];  Service: Orthopedics;  Laterality: Right;  . I & D EXTREMITY Right 01/07/2020   Procedure: RIGHT FOOT EXCISION INFECTED BONE;  Surgeon: [name], MD;  Location: [place];  Service: Orthopedics;  Laterality: Right;  . NECK SURGERY     novemver 2019  . WISDOM TOOTH EXTRACTION     Family History  Problem Relation Age of Onset  . Diabetes Mellitus II Mother   . Kidney disease Mother   . Diabetes Mellitus II Father   . CAD Father   . Cancer Father        prostate  . Kidney disease Father   . Diabetes  Mellitus II Brother   . Kidney disease Brother   . Diabetes Mellitus II Brother   . Stomach cancer Brother 7  . Kidney disease Brother   . Colon cancer Neg Hx   . Colon polyps Neg Hx   . Esophageal cancer Neg Hx   . Rectal cancer Neg Hx   . Pancreatic cancer Neg Hx    Social History:  reports that he quit smoking about 31 years ago. His smoking use included cigars. He started smoking about 47 years ago. He quit after 25.00 years of use. He has never used smokeless tobacco. He reports that he does not drink alcohol and does not use drugs. Allergies  Allergen Reactions  . Mushroom Extract Complex Nausea Only   Prior to Admission medications   Medication Sig Start Date End Date Taking? Authorizing Provider  acetaminophen (TYLENOL) 500 MG tablet Take 1,000-2,000 mg by mouth 2 (two) times daily as needed (pain).   Yes [provider]  allopurinol (ZYLOPRIM) 100 MG tablet Take 200 mg by mouth daily.  05/04/18  Yes [provider]  B Complex-C-Zn-Folic Acid (DIALYVITE 151 WITH ZINC) 0.8 MG TABS Take 1 tablet by mouth daily. 12/23/19  Yes [provider]  calcitRIOL (ROCALTROL) 0.5 MCG capsule Take 0.5 mcg by mouth daily.  08/11/19  Yes [provider]  carvedilol (COREG) 12.5 MG tablet Take 12.5 mg by mouth in the morning and at bedtime.   Yes [provider]  chlorhexidine (PERIDEX) 0.12 % solution 15 mLs by Mouth Rinse route daily. 12/07/19  Yes [provider]  diclofenac Sodium (VOLTAREN) 1 % GEL Apply 1 application topically 4 (four) times daily as needed (pain). 05/04/19  Yes [provider]  levothyroxine (SYNTHROID, LEVOTHROID) 112 MCG tablet Take 112 mcg by mouth daily before breakfast. BRAND NAME SYNTHROID   Yes [provider]  losartan (COZAAR) 100 MG tablet Take 100 mg by mouth daily.   Yes [provider]  Methoxy PEG-Epoetin Beta (MIRCERA IJ) Inject into the skin. 12/23/19  Yes [provider]   mupirocin cream (BACTROBAN) 2 % Apply 1 application topically daily as needed (wound care). 05/23/20  Yes [provider]  oxyCODONE-acetaminophen (PERCOCET/ROXICET) 5-325 MG tablet Take 1 tablet by mouth every 4 (four) hours as needed. Patient taking differently: Take 1 tablet by mouth every 4 (four) hours as needed for severe pain (pain). 01/07/20  Yes [name], MD  pantoprazole (PROTONIX) 40 MG tablet Take 1 tablet (40 mg total) by mouth daily. 05/11/20  Yes Irene Shipper, MD  Polyethyl Glycol-Propyl Glycol (LUBRICANT EYE DROPS) 0.4-0.3 % SOLN Place 1-2 drops into both eyes 3 (three) times daily as needed (dry/irritated eyes.).   Yes [provider]  pregabalin (LYRICA) 150 MG capsule Take 1 capsule (150 mg total) by  mouth in the morning and at bedtime. 05/09/20  Yes Persons, Bevely Palmer, [place]  PRESCRIPTION MEDICATION Apply 1 application topically daily as needed (psoriasis). Cream for psoriasis   Yes [provider]  prochlorperazine (COMPAZINE) 5 MG tablet Take 1 tablet (5 mg total) by mouth every 6 (six) hours as needed for nausea or vomiting. Patient taking differently: Take 5 mg by mouth 2 (two) times daily. 05/11/20  Yes Irene Shipper, MD  rosuvastatin (CRESTOR) 10 MG tablet Take 10 mg by mouth daily.    Yes [provider]  Semaglutide,0.25 or 0.5MG /DOS, (OZEMPIC, 0.25 OR 0.5 MG/DOSE,) 2 MG/1.5ML SOPN Inject 0.5 mg into the skin every Friday.   Yes [provider]  famotidine (PEPCID) 20 MG tablet TAKE 1 TABLET BY MOUTH AFTER SUPPER Patient not taking: Reported on 06/16/2020 06/15/20   Tanda Rockers, MD  Lancets (ONETOUCH DELICA PLUS [id number]) [place]  01/17/20   [provider]   Current Facility-Administered Medications  Medication Dose Route Frequency Provider Last Rate Last Admin  . acetaminophen (TYLENOL) tablet 650 mg  650 mg Oral Q6H PRN Karmen Bongo, MD       Or  . acetaminophen (TYLENOL) suppository 650 mg  650 mg Rectal Q6H PRN  Karmen Bongo, MD      . allopurinol (ZYLOPRIM) tablet 200 mg  200 mg Oral Daily Karmen Bongo, MD   200 mg at 06/17/20 0925  . belladonna-opium (B&O) suppository 16.2-60mg   1 suppository Rectal Q6H PRN Celene Squibb, MD   1 suppository at 06/16/20 2324  . calcitRIOL (ROCALTROL) capsule 0.5 mcg  0.5 mcg Oral Daily Karmen Bongo, MD   0.5 mcg at 06/17/20 0924  . calcium carbonate (dosed in mg elemental calcium) suspension 500 mg of elemental calcium  500 mg of elemental calcium Oral Q6H PRN Karmen Bongo, MD      . camphor-menthol [facility]) lotion 1 application  1 application Topical D4K PRN Karmen Bongo, MD       And  . hydrOXYzine (ATARAX/VISTARIL) tablet 25 mg  25 mg Oral Q8H PRN Karmen Bongo, MD      . carvedilol (COREG) tablet 12.5 mg  12.5 mg Oral BID WC Karmen Bongo, MD   12.5 mg at 06/17/20 0925  . cefTRIAXone (ROCEPHIN) 1 g in sodium chloride 0.9 % 100 mL IVPB  1 g Intravenous Q24H Celene Squibb, MD 200 mL/hr at 06/16/20 2327 1 g at 06/16/20 2327  . chlorhexidine (PERIDEX) 0.12 % solution 15 mL  15 mL Mouth Rinse Daily Karmen Bongo, MD   15 mL at 06/17/20 0925  . Chlorhexidine Gluconate Cloth 2 % PADS 6 each  6 each Topical Daily Pokhrel, Laxman, MD      . docusate sodium (COLACE) capsule 100 mg  100 mg Oral BID Karmen Bongo, MD   100 mg at 06/16/20 2219  . docusate sodium (ENEMEEZ) enema 283 mg  1 enema Rectal PRN Karmen Bongo, MD      . feeding supplement (NEPRO CARB STEADY) liquid 237 mL  237 mL Oral TID PRN Karmen Bongo, MD      . hydrALAZINE (APRESOLINE) injection 5 mg  5 mg Intravenous Q4H PRN Karmen Bongo, MD   5 mg at 06/16/20 2220  . insulin aspart (novoLOG) injection 0-6 Units  0-6 Units Subcutaneous TID WC Karmen Bongo, MD      . levothyroxine (SYNTHROID) tablet 112 mcg  112 mcg Oral QAC breakfast Karmen Bongo, MD   112 mcg at 06/17/20 0610  . losartan (COZAAR)  tablet 100 mg  100 mg Oral Daily Karmen Bongo, MD   100 mg at 06/17/20  7209  . morphine 2 MG/ML injection 2 mg  2 mg Intravenous Q2H PRN Karmen Bongo, MD      . ondansetron [facility]) tablet 4 mg  4 mg Oral Q6H PRN Karmen Bongo, MD       Or  . ondansetron [facility]) injection 4 mg  4 mg Intravenous Q6H PRN Karmen Bongo, MD      . oxyCODONE-acetaminophen (PERCOCET/ROXICET) 5-325 MG per tablet 1-2 tablet  1-2 tablet Oral Q4H PRN Karmen Bongo, MD   2 tablet at 06/16/20 2219  . pantoprazole (PROTONIX) EC tablet 40 mg  40 mg Oral Daily Karmen Bongo, MD   40 mg at 06/17/20 0925  . polyvinyl alcohol (LIQUIFILM TEARS) 1.4 % ophthalmic solution 2 drop  2 drop Both Eyes TID PRN Karmen Bongo, MD      . pregabalin (LYRICA) capsule 150 mg  150 mg Oral BID Karmen Bongo, MD   150 mg at 06/17/20 0925  . rosuvastatin (CRESTOR) tablet 10 mg  10 mg Oral Daily Karmen Bongo, MD   10 mg at 06/17/20 0925  . sodium chloride irrigation 0.9 % 3,000 mL  3,000 mL Irrigation Continuous Shalhoub, Sherryll Burger, MD   3,000 mL at 06/17/20 0450  . sorbitol 70 % solution 30 mL  30 mL Oral PRN Karmen Bongo, MD      . zolpidem Lorrin Mais) tablet 5 mg  5 mg Oral QHS PRN Karmen Bongo, MD         ROS: As per HPI otherwise negative.  Physical Exam: Vitals:   06/16/20 2045 06/16/20 2100 06/17/20 0400 06/17/20 0923  BP: (!) 152/68 (!) 171/77 (!) 147/62 (!) 145/66  Pulse: 90 88 81 79  Resp: (!) 27 20 16 18   Temp:  98.9 F (37.2 C) 98.6 F (37 C) 98.2 F (36.8 C)  TempSrc:  Oral Oral   SpO2: 100% 98% 95% 100%  Weight:      Height:         General: Well appearing, nad  Head: NCAT sclera not icteric MMM Neck: Supple. No JVD appreciated  Lungs: CTA bilaterally without wheezes, rales, or rhonchi.  Heart: RRR no m,r,g,  Abdomen: soft non-tender  GU: Foley in place with cherry colored urine  Lower extremities:without edema or ischemic changes, no open wounds  Neuro: A & O  X 3. Moves all extremities spontaneously. Dialysis Access: LUE AVF +bruit; buttonholes present    Labs: Basic Metabolic Panel: Recent Labs  Lab 06/15/20 0711 06/16/20 1456 06/17/20 0526  NA 135 139 140  K 4.2 4.1 4.6  CL 98 100 103  CO2 28 29 25   GLUCOSE 148* 140* 119*  BUN 31* 45* 53*  CREATININE 3.67* 5.59* 5.78*  CALCIUM 9.2 9.4 9.1   Liver Function Tests: No results for input(s): AST, ALT, ALKPHOS, BILITOT, PROT, ALBUMIN in the last 168 hours. No results for input(s): LIPASE, AMYLASE in the last 168 hours. No results for input(s): AMMONIA in the last 168 hours. CBC: Recent Labs  Lab 06/15/20 0711 06/16/20 1456 06/16/20 2210 06/17/20 0526  WBC 5.9 6.3 8.1 6.6  NEUTROABS 4.8  --   --   --   HGB 9.8* 8.0* 7.5* 7.5*  HCT 32.2* 26.1* 23.5* 24.7*  MCV 88.0 86.7 84.8 87.0  PLT 102* 128* 125* 125*   Cardiac Enzymes: No results for input(s): CKTOTAL, CKMB, CKMBINDEX, TROPONINI in the last 168 hours. CBG:  Recent Labs  Lab 06/16/20 2125 06/17/20 0618  GLUCAP 219* 106*   Iron Studies: No results for input(s): IRON, TIBC, TRANSFERRIN, FERRITIN in the last 72 hours. Studies/Results: US PELVIS LIMITED (TRANSABDOMINAL ONLY)  Result Date: 06/16/2020 CLINICAL DATA:  Blood clot can bladder, continuous bladder irrigation EXAM: LIMITED ULTRASOUND OF PELVIS TECHNIQUE: Limited transabdominal ultrasound examination of the pelvis was performed. COMPARISON:  None. FINDINGS: Sonographic evaluation of the bladder was performed. Foley catheter is seen within the urinary bladder. There is a 6.8 x 7.3 x 6.1 cm blood clot surrounding the Foley catheter balloon. No other filling defects within the bladder. IMPRESSION: 1. Large clot surrounding the Foley catheter as above. Electronically Signed   By: Randa Ngo M.D.   On: 06/16/2020 23:29    Dialysis Orders:  HomeHD NxStage  MTWF EDW 85kg   Assessment/Plan: 1.  Gross hematuria - Hx prostate ca s/p radiation therapy. Urology following -For cystoscopy in OR today.   2.  ESRD -  Home HD. Missed last treatment. Plan for HD today  following surgery. No heparin   3.  Hypertension/volume  - BP/weights up. Continue home meds. UF 3L today  4.  Anemia  - Hgb 7.5 on adm. On ESA as outpatient. Received Mircera 150 mcg  on 3/8. Transfuse prn.  5.  Metabolic bone disease -  OP Ca/Phos at goal. Continue home calcitriol/binder   Lynnda Child PA-C [facility] 06/17/2020, 9:51 AM

## 2020-06-17 NOTE — Plan of Care (Signed)

## 2020-06-17 NOTE — Anesthesia Postprocedure Evaluation (Signed)
Anesthesia Post Note  Patient: [name]  Procedure(s) Performed: CYSTOSCOPY,BILATERAL RETROGRADE, CLOT EVACUATION WITH FULGERATION OF THE BLADDER (Bilateral Bladder)     Patient location during evaluation: PACU Anesthesia Type: General Level of consciousness: awake and alert Pain management: pain level controlled Vital Signs Assessment: post-procedure vital signs reviewed and stable Respiratory status: spontaneous breathing, nonlabored ventilation and respiratory function stable Cardiovascular status: blood pressure returned to baseline and stable Postop Assessment: no apparent nausea or vomiting Anesthetic complications: no   No complications documented.  Last Vitals:  Vitals:   06/17/20 1350 06/17/20 1400  BP: 116/64 121/62  Pulse: 73 71  Resp: 14 13  Temp:    SpO2: 100% 100%    Last Pain:  Vitals:   06/17/20 1400  TempSrc:   PainSc: 5                  FITZGERALD,W. EDMOND

## 2020-06-17 NOTE — Anesthesia Preprocedure Evaluation (Addendum)
Anesthesia Evaluation  Patient identified by MRN, date of birth, ID band Patient awake    Reviewed: Allergy & Precautions, H&P , NPO status , Patient's Chart, lab work & pertinent test results  History of Anesthesia Complications (+) PONV  Airway Mallampati: II  TM Distance: >3 FB Neck ROM: Full    Dental no notable dental hx. (+) Teeth Intact, Dental Advisory Given   Pulmonary neg pulmonary ROS, former smoker,    Pulmonary exam normal breath sounds clear to auscultation       Cardiovascular hypertension, Pt. on medications and Pt. on home beta blockers + CAD, + CABG and + Peripheral Vascular Disease   Rhythm:Regular Rate:Normal     Neuro/Psych negative neurological ROS  negative psych ROS   GI/Hepatic Neg liver ROS, GERD  Medicated,  Endo/Other  diabetesHypothyroidism   Renal/GU CRF and DialysisRenal disease  negative genitourinary   Musculoskeletal  (+) Arthritis , Osteoarthritis,    Abdominal   Peds  Hematology negative hematology ROS (+)   Anesthesia Other Findings   Reproductive/Obstetrics negative OB ROS                            Anesthesia Physical Anesthesia Plan  ASA: III  Anesthesia Plan: General   Post-op Pain Management:    Induction: Intravenous  PONV Risk Score and Plan: 4 or greater and Ondansetron, Dexamethasone and Treatment may vary due to age or medical condition  Airway Management Planned: LMA  Additional Equipment:   Intra-op Plan:   Post-operative Plan: Extubation in OR  Informed Consent: I have reviewed the patients History and Physical, chart, labs and discussed the procedure including the risks, benefits and alternatives for the proposed anesthesia with the patient or authorized representative who has indicated his/her understanding and acceptance.     Dental advisory given  Plan Discussed with: CRNA  Anesthesia Plan Comments:          Anesthesia Quick Evaluation

## 2020-06-17 NOTE — Op Note (Signed)
Operative Note  Preoperative diagnosis:  1.  Clot retention 2. Gross hematuria  Postoperative diagnosis: 1.  Clot retention 2. Gross hematuria 3.  Radiation cystitis  Procedure(s): 1.  Cystoscopy 2.  Clot evacuation of the bladder 3.  Fulguration of bladder 4.  Bilateral retrograde pyelogram 5.  Fluoroscopy with intraoperative interpretation 6.  Meatal dilation  Surgeon: [name], MD   Resident: Celene Squibb, PGY 4  Anesthesia:  General  Complications:  None  EBL: 10 mL  Specimens: None  Drains/Catheters: 1.  24Fr 3 way catheter with 110ml water into balloon  Intraoperative findings:   1. Cystourethroscopy demonstrated approximately 24 French narrowing at the bulbar urethra.  He had evidence of trilobar obstructing prostate with a large intravesical component.  There is evidence of radiation cystitis with neovascularity along the bladder neck, median lobe and anterior bladder neck.  Initial view of the bladder demonstrated large newly formed clot encompassing nearly entirety of the bladder. 2. Clot evacuation approximately 150 cc of clot 3. After clot evacuation, there are a few areas along the trigone of the bladder and anterior bladder neck consistent with radiation cystitis which were oozing.  These were fulgurated.  Excellent hemostasis was obtained. 4. Left retrograde pyelogram demonstrated no filling defects, no hydronephrosis, no extravasation of contrast.  The calyces were thin and delicate and the contrast drained promptly. 5. Right retrograde pyelogram demonstrated no filling defects, no hydronephrosis, no extravasation of contrast.  The calyces were thin and delicate and the contrast drained promptly. 6. Cystoscopy placement 24 French three-way Foley catheter with return of clear urine urine.  Indication:  [name] is a 70 y.o. male with history of localized prostate cancer s/p EBRT in 09/05/2016.  He also has a history of end-stage renal disease is on  home dialysis.  He was found to have gross hematuria.  He was admitted for continuous bladder irrigation.  Ultrasound demonstrated near 7 x 7 x 7 cm clot burden within the bladder.  He is being taken the operating today for clot evacuation and fulguration of the bladder. After thorough discussion including all relevant risk benefits and alternatives, he is ready to proceed.  Description of procedure: The indication, alternatives, benefits and risks were discussed with the patient and informed consent was obtained.  Patient was brought to the operating room table, positioned supine, secured with a safety strap.  Pneumatic compression devices were placed on the lower extremities.  After the administration of intravenous antibiotics and general anesthesia, the patient was repositioned into the dorsal lithotomy position.  All pressure points were carefully padded.  The genitalia were prepped and draped in standard sterile manner.  A timeout was completed, verifying the correct patient, surgical procedure and positioning prior to beginning the procedure.  Isotonic sodium chloride was used for irrigation.  Cystoscope was used to gain entrance into the urethra, we identified approximately 20 French narrowing within the bulbar urethra.  He had an elevated bladder neck with evidence of trilobar hypertrophy with an large intravesical component.  Me to view the bladder demonstrated severe clot burden.  We then dilated sequentially using [name] sounds.  We then passed a 26 French continuous-flow resectoscope sheath with the visual obturator and a 30 degree lens was advanced under direct vision into the bladder.  We then irrigated his bladder removing clot.  We did have to use a loop in order to excise some of the more well-formed clot tissue.  We then irrigated the remainder of his clot.  Approximately  150 cc clot was removed.  We then reintroduced our resectoscope and fulgurated the areas of radiation cystitis on the  trigone and bladder neck.  Excellent hemostasis was obtained.  We then exchanged over cystoscope for cystoscope and perform bilateral retrograde pyelograms with findings dictated as above.  There is no further bleeding.  Having completed the irrigation of clots, we again confirmed hemostasis with the loop with coagulating current.  Upon completion of the entire procedure, the bladder and posterior urethra were reexamined, confirming open prostatic urethra and bladder neck without evidence of bleeding or perforation.  Both ureteral orifices and the external sphincter were noted to be intact.  The resectoscope was withdrawn under direct vision and a 24 [place] three-way Foley catheter with a 30 cc balloon was inserted into the bladder.  The balloon was inflated with 30 cc of sterile water.  After multiple manual irrigations ensuring clear return of the irrigant, the procedure was terminated.  The catheter was attached to a drainage bag and continuous bladder irrigation was started with normal saline.  The patient was positioned supine.  At the end of the procedure, all counts were correct.  Patient tolerated the procedure well and was taken to the recovery room satisfactory condition.  Plan: Continuous bladder irrigation overnight.  Possible void trial tomorrow. [name]  Pager: [phone]

## 2020-06-17 NOTE — Plan of Care (Signed)
  Problem: Nutrition: Goal: Adequate nutrition will be maintained Outcome: Not Progressing   Problem: Coping: Goal: Level of anxiety will decrease Outcome: Not Progressing

## 2020-06-17 NOTE — Progress Notes (Signed)
PROGRESS NOTE  [name] GGY:[id number] DOB: March 14, 1951 DOA: 06/16/2020 PCP: Lorenda Hatchet, FNP   LOS: 1 day   Brief narrative:  [name] is a 70 y.o. male with medical history significant of prostate CA; hypothyroidism; hypertension, hyperlipidemia, diabetes mellitus and end-stage renal disease on hemodialysis-Tuesday Thursday Saturday presented to hospital with hematuria for few days.  Patient had seen urology PA as outpatient but had gotten worse so was brought into the hospital. Urology was consulted and patient underwent Foley catheter placement and a three-way irrigation.  Patient was then admitted to hospital for further evaluation and treatment.  Assessment/Plan:  Principal Problem:   Gross hematuria Active Problems:   Hypercholesteremia   HTN (hypertension)   Hypothyroidism   Diabetes mellitus ([place])   ESRD (end stage renal disease) ([place])  Gross hematuria Likely secondary to radiation cystitis.  Going for a week or so.  Urology on board and on three-way Foley catheter.  Patient will likely go for cystoscopic evaluation today.  Spoke with urology Dr. Abner Greenspan at bedside  ESRD on home HD Patient on chronic MWThS home HD.  Creatinine 5.7 at this time. Dr. Tonna Corner was notified .Continue Allopurinol, Calcitriol  Anemia Likely exacerbated secondary to hematuria.  Has baseline anemia of chronic disease and iron deficiency.  Patient has baseline iron deficiency anemia/anemia associated with CKD. He receives Mircera injections as outpatient.  Hemoglobin of 7.5 today.  Will transfuse PRBC as needed  Prostate CA status post external beam radiation in 2018.  Hypothyroidism -Continue Synthroid.  TSH of 4.8  Essential hypertension Continue Coreg, Cozaar.  Continue to monitor blood pressure.  Hyperlipidemia Continue Crestor  Diabetes mellitus type 2. Hemoglobin A1c of 5.7. Hold Ozempic.  Continue sliding-scale insulin Accu-Cheks diabetic diet.   DVT  prophylaxis: SCDs Start: 06/16/20 1734   Code Status: Full code  Family Communication: None  Status is: Inpatient  Remains inpatient appropriate because:IV treatments appropriate due to intensity of illness or inability to take PO, Inpatient level of care appropriate due to severity of illness and Urology evaluation   Dispo: The patient is from: Home              Anticipated d/c is to: Home              Patient currently is not medically stable to d/c.   Difficult to place patient No   Consultants:  Urology  Procedures:  Three-way irrigation  Anti-infectives:  Marland Kitchen Rocephin iv  Anti-infectives (From admission, onward)   Start     Dose/Rate Route Frequency Ordered Stop   06/16/20 1900  [MAR Hold]  cefTRIAXone (ROCEPHIN) 1 g in sodium chloride 0.9 % 100 mL IVPB        (MAR Hold since Sat 06/17/2020 at 1027.Hold Reason: Transfer to a Procedural area.)   1 g 200 mL/hr over 30 Minutes Intravenous Every 24 hours 06/16/20 1855         Subjective: Today, patient was seen and examined at bedside.   Objective: Vitals:   06/17/20 0400 06/17/20 0923  BP: (!) 147/62 (!) 145/66  Pulse: 81 79  Resp: 16 18  Temp: 98.6 F (37 C) 98.2 F (36.8 C)  SpO2: 95% 100%    Intake/Output Summary (Last 24 hours) at 06/17/2020 1121 Last data filed at 06/17/2020 1054 Gross per 24 hour  Intake 14690 ml  Output 19800 ml  Net -5110 ml   Filed Weights   06/16/20 1641  Weight: 95.3 kg   Body mass index is 30.13  kg/m.   Physical Exam: GENERAL: Patient is alert awake and oriented. Not in obvious distress. HENT: Mild pallor noted.. Pupils equally reactive to light. Oral mucosa is moist NECK: is supple, no gross swelling noted. CHEST: Clear to auscultation. No crackles or wheezes.  Diminished breath sounds bilaterally. CVS: S1 and S2 heard, no murmur. Regular rate and rhythm.  ABDOMEN: Soft, non-tender, bowel sounds are present.  Urinary catheter with pinkish urine. EXTREMITIES: No  edema.  Left fifth toe amputation and right 4, 5 toe amputations CNS: Cranial nerves are intact. No focal motor deficits. SKIN: warm and dry  Data Review: I have personally reviewed the following laboratory data and studies,  CBC: Recent Labs  Lab 06/15/20 0711 06/16/20 1456 06/16/20 2210 06/17/20 0526  WBC 5.9 6.3 8.1 6.6  NEUTROABS 4.8  --   --   --   HGB 9.8* 8.0* 7.5* 7.5*  HCT 32.2* 26.1* 23.5* 24.7*  MCV 88.0 86.7 84.8 87.0  PLT 102* 128* 125* 254*   Basic Metabolic Panel: Recent Labs  Lab 06/15/20 0711 06/16/20 1456 06/17/20 0526  NA 135 139 140  K 4.2 4.1 4.6  CL 98 100 103  CO2 28 29 25   GLUCOSE 148* 140* 119*  BUN 31* 45* 53*  CREATININE 3.67* 5.59* 5.78*  CALCIUM 9.2 9.4 9.1   Liver Function Tests: No results for input(s): AST, ALT, ALKPHOS, BILITOT, PROT, ALBUMIN in the last 168 hours. No results for input(s): LIPASE, AMYLASE in the last 168 hours. No results for input(s): AMMONIA in the last 168 hours. Cardiac Enzymes: No results for input(s): CKTOTAL, CKMB, CKMBINDEX, TROPONINI in the last 168 hours. BNP (last 3 results) No results for input(s): BNP in the last 8760 hours.  ProBNP (last 3 results) No results for input(s): PROBNP in the last 8760 hours.  CBG: Recent Labs  Lab 06/16/20 2125 06/17/20 0618 06/17/20 1009  GLUCAP 219* 106* 110*   Recent Results (from the past 240 hour(s))  Urine Culture     Status: None   Collection Time: 06/15/20 11:59 AM   Specimen: Urine, Random  Result Value Ref Range Status   Specimen Description URINE, RANDOM  Final   Special Requests NONE  Final   Culture   Final    NO GROWTH Performed at [facility], [place] [address], [place], [place] [address]    Report Status 06/16/2020 FINAL  Final  SARS CORONAVIRUS 2 (TAT 6-24 HRS) Nasopharyngeal Nasopharyngeal Swab     Status: None   Collection Time: 06/16/20  7:12 PM   Specimen: Nasopharyngeal Swab  Result Value Ref Range Status   SARS Coronavirus 2  NEGATIVE NEGATIVE Final    Comment: (NOTE) SARS-CoV-2 target nucleic acids are NOT DETECTED.  The SARS-CoV-2 RNA is generally detectable in upper and lower respiratory specimens during the acute phase of infection. Negative results do not preclude SARS-CoV-2 infection, do not rule out co-infections with other pathogens, and should not be used as the sole basis for treatment or other patient management decisions. Negative results must be combined with clinical observations, patient history, and epidemiological information. The expected result is Negative.  Fact Sheet for Patients: SugarRoll.be  Fact Sheet for Healthcare Providers: https://www.woods-mathews.com/  This test is not yet approved or cleared by the [place] FDA and  has been authorized for detection and/or diagnosis of SARS-CoV-2 by FDA under an Emergency Use Authorization (EUA). This EUA will remain  in effect (meaning this test can be used) for the duration of the  COVID-19 declaration under Se ction 564(b)(1) of the Act, 21 U.S.C. section 360bbb-3(b)(1), unless the authorization is terminated or revoked sooner.  Performed at [facility], [place] [address], [place], [place] [address]      Studies: US PELVIS LIMITED (TRANSABDOMINAL ONLY)  Result Date: 06/16/2020 CLINICAL DATA:  Blood clot can bladder, continuous bladder irrigation EXAM: LIMITED ULTRASOUND OF PELVIS TECHNIQUE: Limited transabdominal ultrasound examination of the pelvis was performed. COMPARISON:  None. FINDINGS: Sonographic evaluation of the bladder was performed. Foley catheter is seen within the urinary bladder. There is a 6.8 x 7.3 x 6.1 cm blood clot surrounding the Foley catheter balloon. No other filling defects within the bladder. IMPRESSION: 1. Large clot surrounding the Foley catheter as above. Electronically Signed   By: Randa Ngo M.D.   On: 06/16/2020 23:29      Flora Lipps,  MD  [facility] 06/17/2020  If 7PM-7AM, please contact night-coverage

## 2020-06-17 NOTE — H&P (View-Only) (Signed)
Day of Surgery Subjective: Pain controlled. No nausea. Tolerating foley. Light pink tinge.  Objective: Vital signs in last 24 hours: Temp:  [98.2 F (36.8 C)-98.9 F (37.2 C)] 98.2 F (36.8 C) (03/12 0923) Pulse Rate:  [42-94] 79 (03/12 0923) Resp:  [13-27] 18 (03/12 0923) BP: (145-173)/(60-85) 145/66 (03/12 0923) SpO2:  [90 %-100 %] 100 % (03/12 0923) Weight:  [95.3 kg] 95.3 kg (03/11 1641)  Intake/Output from previous day: 03/11 0701 - 03/12 0700 In: 12240 [P.O.:240] Out: 15300 [Urine:15300] Intake/Output this shift: Total I/O In: 2450 [Other:2450] Out: 3500 [Urine:3500]  Physical Exam:  General: Alert and oriented CV: RRR Lungs: Clear Abdomen: Soft, ND, NT Ext: NT, No erythema  Lab Results: Recent Labs    06/16/20 1456 06/16/20 2210 06/17/20 0526  HGB 8.0* 7.5* 7.5*  HCT 26.1* 23.5* 24.7*   BMET Recent Labs    06/16/20 1456 06/17/20 0526  NA 139 140  K 4.1 4.6  CL 100 103  CO2 29 25  GLUCOSE 140* 119*  BUN 45* 53*  CREATININE 5.59* 5.78*  CALCIUM 9.4 9.1     Studies/Results: US PELVIS LIMITED (TRANSABDOMINAL ONLY)  Result Date: 06/16/2020 CLINICAL DATA:  Blood clot can bladder, continuous bladder irrigation EXAM: LIMITED ULTRASOUND OF PELVIS TECHNIQUE: Limited transabdominal ultrasound examination of the pelvis was performed. COMPARISON:  None. FINDINGS: Sonographic evaluation of the bladder was performed. Foley catheter is seen within the urinary bladder. There is a 6.8 x 7.3 x 6.1 cm blood clot surrounding the Foley catheter balloon. No other filling defects within the bladder. IMPRESSION: 1. Large clot surrounding the Foley catheter as above. Electronically Signed   By: Randa Ngo M.D.   On: 06/16/2020 23:29    Assessment/Plan: 1. Radiation cystitis 2. Clot retention 3. Gross hematuria  -Irrigated minimal clot at bedside. Irrigation is light pink tinge. -Bladder ultrasound with approximately 7x7x7cm well formed clot -Recommend OR for  cysto, b/l RPG, clot evacuation, fulguration of bladder -Will plan for CBI after procedure   LOS: 1 day   Matt R. Johneric Mcfadden MD 06/17/2020, 10:51 AM [facility]  Pager: [phone]

## 2020-06-17 NOTE — Progress Notes (Signed)
Glasses taken to PACU.

## 2020-06-18 ENCOUNTER — Encounter (HOSPITAL_COMMUNITY): Payer: Self-pay | Admitting: Urology

## 2020-06-18 DIAGNOSIS — R310 Gross hematuria: Secondary | ICD-10-CM | POA: Diagnosis not present

## 2020-06-18 DIAGNOSIS — I10 Essential (primary) hypertension: Secondary | ICD-10-CM | POA: Diagnosis not present

## 2020-06-18 DIAGNOSIS — E11621 Type 2 diabetes mellitus with foot ulcer: Secondary | ICD-10-CM | POA: Diagnosis not present

## 2020-06-18 DIAGNOSIS — N186 End stage renal disease: Secondary | ICD-10-CM | POA: Diagnosis not present

## 2020-06-18 LAB — GLUCOSE, CAPILLARY
Glucose-Capillary: 153 mg/dL — ABNORMAL HIGH (ref 70–99)
Glucose-Capillary: 151 mg/dL — ABNORMAL HIGH (ref 70–99)

## 2020-06-18 LAB — BASIC METABOLIC PANEL
Anion gap: 11 (ref 5–15)
BUN: 27 mg/dL — ABNORMAL HIGH (ref 8–23)
CO2: 26 mmol/L (ref 22–32)
Calcium: 8.5 mg/dL — ABNORMAL LOW (ref 8.9–10.3)
Chloride: 100 mmol/L (ref 98–111)
Creatinine, Ser: 3.87 mg/dL — ABNORMAL HIGH (ref 0.61–1.24)
GFR, Estimated: 16 mL/min — ABNORMAL LOW (ref >60)
Glucose, Bld: 219 mg/dL — ABNORMAL HIGH (ref 70–99)
Potassium: 4.2 mmol/L (ref 3.5–5.1)
Sodium: 137 mmol/L (ref 135–145)

## 2020-06-18 LAB — HEMOGLOBIN AND HEMATOCRIT, BLOOD
HCT: 20.3 % — ABNORMAL LOW (ref 39.0–52.0)
HCT: 22.8 % — ABNORMAL LOW (ref 39.0–52.0)
Hemoglobin: 6.6 g/dL — CL (ref 13.0–17.0)
Hemoglobin: 7.1 g/dL — ABNORMAL LOW (ref 13.0–17.0)

## 2020-06-18 LAB — CBC
HCT: 21.9 % — ABNORMAL LOW (ref 39.0–52.0)
Hemoglobin: 6.6 g/dL — CL (ref 13.0–17.0)
MCH: 26.7 pg (ref 26.0–34.0)
MCHC: 30.1 g/dL (ref 30.0–36.0)
MCV: 88.7 fL (ref 80.0–100.0)
Platelets: 115 10*3/uL — ABNORMAL LOW (ref 150–400)
RBC: 2.47 MIL/uL — ABNORMAL LOW (ref 4.22–5.81)
RDW: 17.2 % — ABNORMAL HIGH (ref 11.5–15.5)
WBC: 8.4 10*3/uL (ref 4.0–10.5)
nRBC: 0 % (ref 0.0–0.2)

## 2020-06-18 LAB — MAGNESIUM: Magnesium: 1.9 mg/dL (ref 1.7–2.4)

## 2020-06-18 MED ORDER — PROSOURCE PLUS PO LIQD
30.0000 mL | Freq: Two times a day (BID) | ORAL | Status: DC
  Filled 2020-06-18: qty 30

## 2020-06-18 MED ORDER — NEPRO/CARBSTEADY PO LIQD: 237.0000 mL | Freq: Two times a day (BID) | ORAL | Status: DC

## 2020-06-18 MED ORDER — RENA-VITE PO TABS
1.0000 | ORAL_TABLET | Freq: Every day | ORAL | Status: DC
  Administered 2020-06-18 – 2020-06-29 (×12): 1 via ORAL
  Filled 2020-06-18 (×12): qty 1

## 2020-06-18 NOTE — Progress Notes (Signed)
Patient educated about need for transfer to progressive care unit, voiced understanding.  Called report to 4E Nelida Gores.  All questions answered.

## 2020-06-18 NOTE — Progress Notes (Signed)
[facility] Progress Note   Subjective:  OR yesterday -cystoscopy/clot removal  Completed dialysis net UF 1.4L  No new complaints. Just anxious to hear next steps.   Objective Vitals:   06/17/20 2007 06/18/20 0115 06/18/20 0144 06/18/20 0445  BP: (!) 127/56 128/72 (!) 107/57 (!) 144/96  Pulse: 82 88 74 84  Resp: 16 16 16    Temp: 97.6 F (36.4 C) 97.9 F (36.6 C) 97.9 F (36.6 C) 97.9 F (36.6 C)  TempSrc: Oral Oral Axillary Oral  SpO2: 92% 96% 94% 100%  Weight:      Height:         Additional Objective Labs: Basic Metabolic Panel: Recent Labs  Lab 06/16/20 1456 06/17/20 0526 06/18/20 0118  NA 139 140 137  K 4.1 4.6 4.2  CL 100 103 100  CO2 29 25 26   GLUCOSE 140* 119* 219*  BUN 45* 53* 27*  CREATININE 5.59* 5.78* 3.87*  CALCIUM 9.4 9.1 8.5*   CBC: Recent Labs  Lab 06/15/20 0711 06/16/20 1456 06/16/20 2210 06/17/20 0526 06/17/20 2318 06/18/20 0118 06/18/20 0619  WBC 5.9 6.3 8.1 6.6  --  8.4  --   NEUTROABS 4.8  --   --   --   --   --   --   HGB 9.8* 8.0* 7.5* 7.5* 6.6* 6.6* 7.1*  HCT 32.2* 26.1* 23.5* 24.7* 20.3* 21.9* 22.8*  MCV 88.0 86.7 84.8 87.0  --  88.7  --   PLT 102* 128* 125* 125*  --  115*  --    Blood Culture    Component Value Date/Time   SDES URINE, RANDOM 06/15/2020 1159   SPECREQUEST NONE 06/15/2020 1159   CULT  06/15/2020 1159    NO GROWTH Performed at [facility], [place] [address] [place], [place] [address]    REPTSTATUS 06/16/2020 FINAL 06/15/2020 1159     Physical Exam General: Well appearing, nad  Heart: RRR no m,r,g Lungs: Clear bilaterally  Abdomen: soft, active bs Extremities: No sig LE edema  Dialysis Access: LUE AVF +bruit   Medications: . cefTRIAXone (ROCEPHIN)  IV 1 g (06/17/20 2231)  . sodium chloride irrigation     . sodium chloride   Intravenous Once  . allopurinol  200 mg Oral Daily  . calcitRIOL  0.5 mcg Oral Daily  . carvedilol  12.5 mg Oral BID WC  . chlorhexidine  15 mL Mouth  Rinse Daily  . Chlorhexidine Gluconate Cloth  6 each Topical Daily  . Chlorhexidine Gluconate Cloth  6 each Topical Q0600  . docusate sodium  100 mg Oral BID  . insulin aspart  0-6 Units Subcutaneous TID WC  . levothyroxine  112 mcg Oral QAC breakfast  . losartan  100 mg Oral Daily  . pantoprazole  40 mg Oral Daily  . pregabalin  150 mg Oral BID  . rosuvastatin  10 mg Oral Daily    Dialysis Orders:  HomeHD NxStage  MTWF EDW 85kg   Assessment/Plan: 1. Gross hematuria - Hx prostate ca s/p radiation therapy. Urology following -s/p cystoscopy/clot removal in OR 3/12.  2. ESRD -  Home HD. Missed last treatment. Had HD off schedule 3/12. Continue MWF schedule while here. Next HD 3/14.  3. Hypertension/volume  - BP sl elevated. Continue home meds.--Coreg, Losartan, hydralazine.  UF as tolerated.  4. Anemia  - Hgb trend down 7.5>6.6 Transfused overnight.   On ESA as outpatient. Received Mircera 150 mcg on 3/8. T 5.  Metabolic bone disease -  OP Ca/Phos at goal. Continue home calcitriol.    Lynnda Child PA-C [facility] 06/18/2020,8:56 AM

## 2020-06-18 NOTE — Progress Notes (Addendum)
PROGRESS NOTE  [name] OMV:[id number] DOB: 1950-04-17 DOA: 06/16/2020 PCP: Lorenda Hatchet, FNP   LOS: 2 days   Brief narrative:  [name] is a 70 y.o. male with medical history significant of prostate CA; hypothyroidism; hypertension, hyperlipidemia, diabetes mellitus and end-stage renal disease on hemodialysis onTuesday Thursday Saturday presented to hospital with hematuria for few days.  Patient had seen urology PA as outpatient but had gotten worse so was brought into the hospital. Urology was consulted and patient underwent Foley catheter placement and a three-way irrigation.  Patient was then admitted to hospital for further evaluation and treatment.  Procedure hematuria patient underwent a cystoscopic evaluation with fulguration on 06/17/2020.  Patient persisted to have blood clots after fulguration so was continued on CBI.  Urology followed the patient during hospitalization.  Assessment/Plan:  Principal Problem:   Gross hematuria Active Problems:   Hypercholesteremia   HTN (hypertension)   Hypothyroidism   Diabetes mellitus ([place])   ESRD (end stage renal disease) ([place])  Gross hematuria Likely secondary to radiation cystitis.  Urology on board and is currently on CBI. Patient underwent cystoscopy with fulguration on 06/17/2020. There is continued hematuria and blood clots.  Follow as per urology recommendation.  ESRD on home HD Patient on chronic MWThS home HD.  Creatinine 5.7 at this time.  Nephrology on board for hemodialysis.Continue Allopurinol, Calcitriol  Anemia Likely exacerbated secondary to acute blood loss from hematuria.  Has baseline anemia of chronic disease and iron deficiency.  Patient has baseline iron deficiency anemia/anemia associated with CKD. He receives Mircera injections as outpatient.  Hemoglobin of 7.1 today after 1 unit of packed RBC transfusion for hemoglobin of 6.6.  We will continue to monitor CBC closely and transfuse as  necessary.  Prostate CA status post external beam radiation in 2018.  Hypothyroidism -Continue Synthroid.  TSH of 4.8  Essential hypertension Continue Coreg, Cozaar.  Continue to monitor blood pressure.  Hyperlipidemia Continue Crestor  Diabetes mellitus type 2. Hemoglobin A1c of 5.7. Hold Ozempic.  Continue sliding-scale insulin Accu-Cheks diabetic diet.  Closely monitor blood glucose levels.  DVT prophylaxis: SCDs Start: 06/16/20 1734   Code Status: Full code  Family Communication: None  Status is: Inpatient  Remains inpatient appropriate because:IV treatments appropriate due to intensity of illness or inability to take PO, Inpatient level of care appropriate due to severity of illness and Urology evaluation, CBI, continued hematuria   Dispo: The patient is from: Home              Anticipated d/c is to: Home when okay with urology.              Patient currently is not medically stable to d/c.   Difficult to place patient No  Consultants:  Urology  Procedures:  Continuous bladder irrigation  Cystoscopy with fulguration of the bladder on 06/17/2020  Anti-infectives:  Marland Kitchen Rocephin iv 3/11>  Anti-infectives (From admission, onward)   Start     Dose/Rate Route Frequency Ordered Stop   06/16/20 1900  cefTRIAXone (ROCEPHIN) 1 g in sodium chloride 0.9 % 100 mL IVPB        1 g 200 mL/hr over 30 Minutes Intravenous Every 24 hours 06/16/20 1855       Subjective: Today, patient was seen and examined at bedside.  Patient complains of spasms in the bladder from blood clots.  Objective: Vitals:   06/18/20 0445 06/18/20 1019  BP: (!) 144/96 (!) 126/58  Pulse: 84 71  Resp:  18  Temp:  97.9 F (36.6 C) 98.4 F (36.9 C)  SpO2: 100% 100%    Intake/Output Summary (Last 24 hours) at 06/18/2020 1142 Last data filed at 06/18/2020 1100 Gross per 24 hour  Intake 27100 ml  Output 20284 ml  Net 6816 ml   Filed Weights   06/16/20 1641  Weight: 95.3 kg   Body mass  index is 30.13 kg/m.   Physical Exam:  General:  Average built, not in obvious distress HENT:   Mild pallor noted, oral mucosa is moist.  Chest:  Clear breath sounds.  Diminished breath sounds bilaterally. No crackles or wheezes.  CVS: S1 &S2 heard. No murmur.  Regular rate and rhythm. Abdomen: Soft, nontender, nondistended.  Bowel sounds are heard.  Urinary catheter with pinkish urine Extremities: No cyanosis, clubbing or edema.  Peripheral pulses are palpable.  Left fifth toe amputation, right fourth and fifth toe amputation Psych: Alert, awake and oriented, normal mood CNS:  No cranial nerve deficits.  Power equal in all extremities.   Skin: Warm and dry.  No rashes noted.   Data Review: I have personally reviewed the following laboratory data and studies,  CBC: Recent Labs  Lab 06/15/20 0711 06/16/20 1456 06/16/20 2210 06/17/20 0526 06/17/20 2318 06/18/20 0118 06/18/20 0619  WBC 5.9 6.3 8.1 6.6  --  8.4  --   NEUTROABS 4.8  --   --   --   --   --   --   HGB 9.8* 8.0* 7.5* 7.5* 6.6* 6.6* 7.1*  HCT 32.2* 26.1* 23.5* 24.7* 20.3* 21.9* 22.8*  MCV 88.0 86.7 84.8 87.0  --  88.7  --   PLT 102* 128* 125* 125*  --  115*  --    Basic Metabolic Panel: Recent Labs  Lab 06/15/20 0711 06/16/20 1456 06/17/20 0526 06/18/20 0118  NA 135 139 140 137  K 4.2 4.1 4.6 4.2  CL 98 100 103 100  CO2 28 29 25 26   GLUCOSE 148* 140* 119* 219*  BUN 31* 45* 53* 27*  CREATININE 3.67* 5.59* 5.78* 3.87*  CALCIUM 9.2 9.4 9.1 8.5*  MG  --   --   --  1.9   Liver Function Tests: No results for input(s): AST, ALT, ALKPHOS, BILITOT, PROT, ALBUMIN in the last 168 hours. No results for input(s): LIPASE, AMYLASE in the last 168 hours. No results for input(s): AMMONIA in the last 168 hours. Cardiac Enzymes: No results for input(s): CKTOTAL, CKMB, CKMBINDEX, TROPONINI in the last 168 hours. BNP (last 3 results) No results for input(s): BNP in the last 8760 hours.  ProBNP (last 3 results) No  results for input(s): PROBNP in the last 8760 hours.  CBG: Recent Labs  Lab 06/17/20 0618 06/17/20 1009 06/17/20 1353 06/17/20 1959 06/18/20 0623  GLUCAP 106* 110* 113* 221* 153*   Recent Results (from the past 240 hour(s))  Urine Culture     Status: None   Collection Time: 06/15/20 11:59 AM   Specimen: Urine, Random  Result Value Ref Range Status   Specimen Description URINE, RANDOM  Final   Special Requests NONE  Final   Culture   Final    NO GROWTH Performed at [facility], [place] [address], [place], [place] [address]    Report Status 06/16/2020 FINAL  Final  SARS CORONAVIRUS 2 (TAT 6-24 HRS) Nasopharyngeal Nasopharyngeal Swab     Status: None   Collection Time: 06/16/20  7:12 PM   Specimen: Nasopharyngeal Swab  Result Value Ref Range Status  SARS Coronavirus 2 NEGATIVE NEGATIVE Final    Comment: (NOTE) SARS-CoV-2 target nucleic acids are NOT DETECTED.  The SARS-CoV-2 RNA is generally detectable in upper and lower respiratory specimens during the acute phase of infection. Negative results do not preclude SARS-CoV-2 infection, do not rule out co-infections with other pathogens, and should not be used as the sole basis for treatment or other patient management decisions. Negative results must be combined with clinical observations, patient history, and epidemiological information. The expected result is Negative.  Fact Sheet for Patients: SugarRoll.be  Fact Sheet for Healthcare Providers: https://www.woods-mathews.com/  This test is not yet approved or cleared by the [place] FDA and  has been authorized for detection and/or diagnosis of SARS-CoV-2 by FDA under an Emergency Use Authorization (EUA). This EUA will remain  in effect (meaning this test can be used) for the duration of the COVID-19 declaration under Se ction 564(b)(1) of the Act, 21 U.S.C. section 360bbb-3(b)(1), unless the authorization is terminated  or revoked sooner.  Performed at [facility], [place] [address], [place], [place] [address]      Studies: US PELVIS LIMITED (TRANSABDOMINAL ONLY)  Result Date: 06/16/2020 CLINICAL DATA:  Blood clot can bladder, continuous bladder irrigation EXAM: LIMITED ULTRASOUND OF PELVIS TECHNIQUE: Limited transabdominal ultrasound examination of the pelvis was performed. COMPARISON:  None. FINDINGS: Sonographic evaluation of the bladder was performed. Foley catheter is seen within the urinary bladder. There is a 6.8 x 7.3 x 6.1 cm blood clot surrounding the Foley catheter balloon. No other filling defects within the bladder. IMPRESSION: 1. Large clot surrounding the Foley catheter as above. Electronically Signed   By: Randa Ngo M.D.   On: 06/16/2020 23:29   DG Cystogram  Result Date: 06/18/2020 CLINICAL DATA:  Cystoscopy. EXAM: CYSTOGRAM TECHNIQUE: Retrograde pyelogram. FLUOROSCOPY TIME:  Fluoroscopy Time:  28 seconds Radiation Exposure Index (if provided by the fluoroscopic device): 9.02 mGy Number of Acquired Spot Images: 2 COMPARISON:  None. FINDINGS: Retrograde pyelogram images were obtained bilaterally. The images were not marked laterality. One of the pyelograms demonstrates no filling defects in the visualized collecting system. The other pyelogram demonstrates a filling defect in the proximal ureter. The op report states no filling defects. As a result, the filling defect on this study may represent an air bubble. IMPRESSION: Bilateral pyelograms as above. Electronically Signed   By: Dorise Bullion III M.D   On: 06/18/2020 07:08   DG C-Arm 1-60 Min  Result Date: 06/17/2020 CLINICAL DATA:  Cystogram. EXAM: DG C-ARM 1-60 MIN FLUOROSCOPY TIME:  Fluoroscopy Time:  28 seconds Number of Acquired Spot Images: 2 COMPARISON:  None. FINDINGS: Retrograde pyelogram performed bilaterally. There is an apparent defect in the proximal ureter on 1 side. The side is not marked. However, correlation with the  operative note from today indicated no filling defects in this may simply represent an air bubble that was transient. No other defects identified. IMPRESSION: Retrograde pyelogram as above. Electronically Signed   By: Dorise Bullion III M.D   On: 06/17/2020 15:33      Flora Lipps, MD  [facility] 06/18/2020  If 7PM-7AM, please contact night-coverage

## 2020-06-18 NOTE — Progress Notes (Signed)
PM hospitalist notified about ongoing need for irrigation and for foley in addition to CBI for blood clots.  Patient feels pain/pressure when clots are in foley catheter.  Pain meds and bella donna given.  Labs and transfer to progressive care ordered. Patient refusing labs earlier, educated and advised of repeat H&H due to amount of clots noted when foley is flushed.  Patient states he will allow labs but we "need to get the best" to draw them.  Also advised patient of need to transfer to progressive care unit, patient states he does not want to leave staff on this floor yet, "maybe tomorrow".  Urology paged, notified of frequent need for flushes in addition to CBI and large amounts of blood clots.  Adivsed patient experiences pain/discomfort when tube has clots, pain meds bella donna given.  No orders received from Urology at this time.

## 2020-06-18 NOTE — Progress Notes (Addendum)
HOSPITAL MEDICINE OVERNIGHT EVENT NOTE    Notified by nursing that patient has been exhibiting ongoing gross hematuria with frequent blood clots since earlier in the evening.  This has required a significant amount of nursing care which has outstripped the ability of this MedSurg unit.  Will place order to transition patient to progressive care for continued close monitoring of hematuria, frequent bladder flushes and continuous bladder irrigation.  Furthermore, note that patient had hemoglobin of 6.6 this morning.  Patient is refusing repeat labs but I am extremely concerned that his hemoglobin may be critical with a significant amount of hematuria is experiencing.  Will reorder blood count and and coags and attempt to encourage patient to go through with a repeat blood testing.  Vernelle Emerald  MD [facility]   OVERNIGHT EVENT (3/14 1:50AM)  Notified by nursing that repeat hemoglobin is 6.3, which is actually a decrease in hemoglobin despite 1 unit packed red blood cell transfusion.  Patient has been transferred to 4 E. progressive unit or nursing there says that the patient's gross bleeding is slowly subsiding.  We will proceed with transfusing an additional unit of blood, will then obtain another hemoglobin and hematocrit after transfusion is complete.  Continuing continuous bladder irrigation, close careful monitoring.  Patient remains hemodynamically stable.  Sherryll Burger Tianni Escamilla

## 2020-06-18 NOTE — Progress Notes (Addendum)
1 Day Post-Op Subjective: SBP 80s post-procedure with Hgb 6.6. Received 1u pRBC with subsequent Hgb 7.1. Vitals subsequently normalized  CBI clamped this morning several hours to urology rounds. Unfortunately, hematuria worsened to cherry with stringy clots off CBI. Urology irrigated foley to clear at bedside with removal of several medium-sized stringy clots and restarted CBI  Objective: Vital signs in last 24 hours: Temp:  [97 F (36.1 C)-98.5 F (36.9 C)] 98.4 F (36.9 C) (03/13 1019) Pulse Rate:  [68-88] 71 (03/13 1019) Resp:  [13-18] 18 (03/13 1019) BP: (80-144)/(33-96) 126/58 (03/13 1019) SpO2:  [92 %-100 %] 100 % (03/13 1019)  Intake/Output from previous day: 03/12 0701 - 03/13 0700 In: 24097 [I.V.:100; Blood:470; IV Piggyback:250] Out: 20534 [Urine:19075] Intake/Output this shift: No intake/output data recorded.  Physical Exam:  General: Alert and oriented CV: Regular rate Lungs: NWOB on RA GU: 24 Fr 3-way hematuria catheter in placement draining pink urine on moderate drip CBI Ext: Warm and well-perfused   Lab Results: Recent Labs    06/17/20 2318 06/18/20 0118 06/18/20 0619  HGB 6.6* 6.6* 7.1*  HCT 20.3* 21.9* 22.8*   BMET Recent Labs    06/17/20 0526 06/18/20 0118  NA 140 137  K 4.6 4.2  CL 103 100  CO2 25 26  GLUCOSE 119* 219*  BUN 53* 27*  CREATININE 5.78* 3.87*  CALCIUM 9.1 8.5*     Studies/Results: US PELVIS LIMITED (TRANSABDOMINAL ONLY)  Result Date: 06/16/2020 CLINICAL DATA:  Blood clot can bladder, continuous bladder irrigation EXAM: LIMITED ULTRASOUND OF PELVIS TECHNIQUE: Limited transabdominal ultrasound examination of the pelvis was performed. COMPARISON:  None. FINDINGS: Sonographic evaluation of the bladder was performed. Foley catheter is seen within the urinary bladder. There is a 6.8 x 7.3 x 6.1 cm blood clot surrounding the Foley catheter balloon. No other filling defects within the bladder. IMPRESSION: 1. Large clot surrounding  the Foley catheter as above. Electronically Signed   By: Randa Ngo M.D.   On: 06/16/2020 23:29   DG Cystogram  Result Date: 06/18/2020 CLINICAL DATA:  Cystoscopy. EXAM: CYSTOGRAM TECHNIQUE: Retrograde pyelogram. FLUOROSCOPY TIME:  Fluoroscopy Time:  28 seconds Radiation Exposure Index (if provided by the fluoroscopic device): 9.02 mGy Number of Acquired Spot Images: 2 COMPARISON:  None. FINDINGS: Retrograde pyelogram images were obtained bilaterally. The images were not marked laterality. One of the pyelograms demonstrates no filling defects in the visualized collecting system. The other pyelogram demonstrates a filling defect in the proximal ureter. The op report states no filling defects. As a result, the filling defect on this study may represent an air bubble. IMPRESSION: Bilateral pyelograms as above. Electronically Signed   By: Dorise Bullion III M.D   On: 06/18/2020 07:08   DG C-Arm 1-60 Min  Result Date: 06/17/2020 CLINICAL DATA:  Cystogram. EXAM: DG C-ARM 1-60 MIN FLUOROSCOPY TIME:  Fluoroscopy Time:  28 seconds Number of Acquired Spot Images: 2 COMPARISON:  None. FINDINGS: Retrograde pyelogram performed bilaterally. There is an apparent defect in the proximal ureter on 1 side. The side is not marked. However, correlation with the operative note from today indicated no filling defects in this may simply represent an air bubble that was transient. No other defects identified. IMPRESSION: Retrograde pyelogram as above. Electronically Signed   By: Dorise Bullion III M.D   On: 06/17/2020 15:33    Assessment/Plan: 1. Radiation cystitis 2. Clot retention 3. Gross hematuria  POD1 s/p cystourethroscopy and clot evacuation with fulguration as well as bilateral retrograde pyelograms to complete  hematuria workup (negative for upper tract abnormalities). Patient with persistent bleeding requiring 1u pRBC overnight and continued CBI. We are hopeful that his hematuria will resolve over the next few  days with continued CBI.   - Titrate CBI to achieve pink urine. If foley catheter stops draining, please disconnect tubing and manually irrigate foley catheter. If unable to irrigate foley catheter, please page urology - Continue B&O suppositories PRN for bladder spasms with foley in place - Continue to trend Hgb. Transfuse as needed - Continue to hold home ASA 81mg  given active hematuria - We will continue to follow. We greatly appreciate Family Medicine's help with this medically complicated patient      LOS: 2 days   I have seen and examined the patient and agree with the above assessment and plan.  S/p clot evacuation/fulguration/ b/l RPG on 3/12 with evidence of radiation cystitis. Required transfusion overnight with 1 unit. CBI clamped this AM with small clots and darker urine. Irrigated out about 5cc clot during rounds with irrigant clear to slight pink tinge after. Recommend CBI today, titrate to light pink. We discussed that this will likely resolved with irrigation.  [facility]  Pager: [phone]

## 2020-06-18 NOTE — Progress Notes (Signed)
Initial Nutrition Assessment  DOCUMENTATION CODES:   Not applicable  INTERVENTION:   Nepro Shake po BID, each supplement provides 425 kcal and 19 grams protein  31ml Prosource Plus po BID, each supplement provides 100 kcals and 15 grams of protein  renavite daily  NUTRITION DIAGNOSIS:   Increased nutrient needs related to chronic illness (ESRD on HD) as evidenced by estimated needs.  GOAL:   Patient will meet greater than or equal to 90% of their needs  MONITOR:   PO intake,Supplement acceptance,Labs,Weight trends,I & O's  REASON FOR ASSESSMENT:   Consult Other (Comment) ("nutritional goals")  ASSESSMENT:   Pt admitted with gross hematuria, likely 2/2 radiation cystitis. PMH includes prostate cancer, hypothyroidism, HTN, HLD, DM, and ESRD on HD.  3/12 s/p cystoscopy/clot removal   Pt unavailable at time of RD visit.   PO intake: 25-75% x 2 recorded meals. Will order oral nutrition supplements to provide pt with additional kcals/protein.   UOP: 4242ml documented today  EDW: 85 kg (? Need new EDW?) Current wt: 95.3 kg Last HD 3/12, net UF 1.4L  Pt noted to have non-pitting edema to BLE per RN assessment.   Medications: rocaltrol, colace, ss novolog TID w/ meals, protonix Labs: Cr 3.87 (H), Hgb 7.1 (L) CBGs 153-221   Diet Order:   Diet Order            Diet regular Room service appropriate? Yes; Fluid consistency: Thin  Diet effective now                 EDUCATION NEEDS:   No education needs have been identified at this time  Skin:  Skin Assessment: Skin Integrity Issues: Skin Integrity Issues:: Incisions Incisions: perineum  Last BM:  3/11  Height:   Ht Readings from Last 1 Encounters:  06/16/20 5\' 10"  (1.778 m)    Weight:   Wt Readings from Last 1 Encounters:  06/16/20 95.3 kg    BMI:  Body mass index is 30.13 kg/m.  Estimated Nutritional Needs:   Kcal:  4098-1191  Protein:  115-130 grams  Fluid:  1L+UOP    Larkin Ina, MS, RD, LDN RD pager number and weekend/on-call pager number located in [place].

## 2020-06-19 DIAGNOSIS — N186 End stage renal disease: Secondary | ICD-10-CM | POA: Diagnosis not present

## 2020-06-19 DIAGNOSIS — R310 Gross hematuria: Secondary | ICD-10-CM | POA: Diagnosis not present

## 2020-06-19 DIAGNOSIS — E11621 Type 2 diabetes mellitus with foot ulcer: Secondary | ICD-10-CM | POA: Diagnosis not present

## 2020-06-19 DIAGNOSIS — I10 Essential (primary) hypertension: Secondary | ICD-10-CM | POA: Diagnosis not present

## 2020-06-19 LAB — GLUCOSE, CAPILLARY
Glucose-Capillary: 143 mg/dL — ABNORMAL HIGH (ref 70–99)
Glucose-Capillary: 135 mg/dL — ABNORMAL HIGH (ref 70–99)
Glucose-Capillary: 127 mg/dL — ABNORMAL HIGH (ref 70–99)

## 2020-06-19 LAB — CBC
HCT: 19.4 % — ABNORMAL LOW (ref 39.0–52.0)
Hemoglobin: 6.3 g/dL — CL (ref 13.0–17.0)
MCH: 28.4 pg (ref 26.0–34.0)
MCHC: 32.5 g/dL (ref 30.0–36.0)
MCV: 87.4 fL (ref 80.0–100.0)
Platelets: 123 10*3/uL — ABNORMAL LOW (ref 150–400)
RBC: 2.22 MIL/uL — ABNORMAL LOW (ref 4.22–5.81)
RDW: 17.2 % — ABNORMAL HIGH (ref 11.5–15.5)
WBC: 5.7 10*3/uL (ref 4.0–10.5)
nRBC: 0 % (ref 0.0–0.2)

## 2020-06-19 LAB — PROTIME-INR
INR: 1.2 (ref 0.8–1.2)
Prothrombin Time: 14.7 seconds (ref 11.4–15.2)

## 2020-06-19 LAB — HEMOGLOBIN AND HEMATOCRIT, BLOOD
HCT: 22 % — ABNORMAL LOW (ref 39.0–52.0)
Hemoglobin: 7.1 g/dL — ABNORMAL LOW (ref 13.0–17.0)

## 2020-06-19 LAB — PREPARE RBC (CROSSMATCH)

## 2020-06-19 LAB — APTT: aPTT: 34 seconds (ref 24–36)

## 2020-06-19 MED ORDER — SODIUM CHLORIDE 0.9 % IR SOLN
Status: AC
  Filled 2020-06-19 (×7): qty 30

## 2020-06-19 MED ORDER — SODIUM CHLORIDE 0.9% IV SOLUTION: Freq: Once | INTRAVENOUS | Status: AC

## 2020-06-19 NOTE — Progress Notes (Signed)
PROGRESS NOTE  [name] NUU:[id number] DOB: 1950/08/12 DOA: 06/16/2020 PCP: Lorenda Hatchet, FNP   LOS: 3 days   Brief narrative:  [name] is a 70 y.o. male with medical history significant of prostate CA; hypothyroidism; hypertension, hyperlipidemia, diabetes mellitus and end-stage renal disease on hemodialysis onTuesday Thursday Saturday presented to hospital with hematuria for few days.  Patient had seen urology PA as outpatient but had gotten worse so was brought into the hospital. Urology was consulted and patient underwent Foley catheter placement and a three-way irrigation.  Patient was then admitted to hospital for further evaluation and treatment.  During to large hematuria with clots, patient underwent a cystoscopic evaluation with fulguration on 06/17/2020.  Patient persisted to have blood clots after fulguration so was continued on CBI.  Urology followed the patient during hospitalization.  Assessment/Plan:  Principal Problem:   Gross hematuria Active Problems:   Hypercholesteremia   HTN (hypertension)   Hypothyroidism   Diabetes mellitus ([place])   ESRD (end stage renal disease) ([place])  Gross hematuria-passage of clots Likely secondary to radiation cystitis.  Urology on board and is currently on CBI. Patient underwent cystoscopy with fulguration on 06/17/2020. There is continued hematuria and blood clots.  Urology manually evacuating the clots plus patient is undergoing continuous bladder irrigation.  Continue to follow urology recommendation.  ESRD on home HD Patient on chronic [facility]ThS home HD.  Creatinine 5.7 at this time.  Nephrology on board for hemodialysis.Continue Allopurinol, Calcitriol  Anemia Likely exacerbated secondary to acute blood loss from hematuria.  Has baseline anemia of chronic disease and iron deficiency.  Patient has baseline iron deficiency anemia/anemia associated with CKD. He receives Mircera injections as outpatient.  Hemoglobin of 6.3 today.   Will receive 1 unit of packed RBC today   Prostate CA status post external beam radiation in 2018.  Hypothyroidism -Continue Synthroid.  TSH of 4.8  Essential hypertension Continue Coreg, Cozaar.  Continue to monitor blood pressure.  Blood pressure is stable at this time.  Hyperlipidemia Continue Crestor  Diabetes mellitus type 2. Hemoglobin A1c of 5.7.  On Ozempic at home..  Continue sliding-scale insulin Accu-Cheks diabetic diet.  Closely monitor blood glucose levels.  Latest POC glucose of 143.  DVT prophylaxis: SCDs Start: 06/16/20 1734   Code Status: Full code  Family Communication:  I spoke with the patient's wife [name] on the phone and updated her about the clinical condition of the patient.  Status is: Inpatient  Remains inpatient appropriate because:IV treatments appropriate due to intensity of illness or inability to take PO, Inpatient level of care appropriate due to severity of illness and Urology evaluation, CBI, continued hematuria, packed RBC transfusion.   Dispo: The patient is from: Home              Anticipated d/c is to: Home when okay with urology.              Patient currently is not medically stable to d/c.   Difficult to place patient No  Consultants:  Urology  Procedures:  Continuous bladder irrigation  Cystoscopy with fulguration of the bladder on 06/17/2020  Anti-infectives:  Marland Kitchen Rocephin iv 3/11>  Anti-infectives (From admission, onward)   Start     Dose/Rate Route Frequency Ordered Stop   06/16/20 1900  cefTRIAXone (ROCEPHIN) 1 g in sodium chloride 0.9 % 100 mL IVPB        1 g 200 mL/hr over 30 Minutes Intravenous Every 24 hours 06/16/20 1855  Subjective: Today, patient was seen and examined at bedside. Still complains of having hematuria and clots.  Required 1 unit of packed RBC this morning.  Objective: Vitals:   06/19/20 0700 06/19/20 0804  BP:  108/60  Pulse:  72  Resp:  14  Temp:  97.9 F (36.6 C)  SpO2: 100%  98%    Intake/Output Summary (Last 24 hours) at 06/19/2020 1127 Last data filed at 06/19/2020 1120 Gross per 24 hour  Intake 59354.67 ml  Output 44500 ml  Net 14854.67 ml   Filed Weights   06/16/20 1641  Weight: 95.3 kg   Body mass index is 30.13 kg/m.   Physical Exam: General:  Average built, not in obvious distress HENT:   No scleral pallor or icterus noted. Oral mucosa is moist.  Chest:  Clear breath sounds.  Diminished breath sounds bilaterally. No crackles or wheezes.  CVS: S1 &S2 heard. No murmur.  Regular rate and rhythm. Abdomen: Soft, nontender, nondistended.  Bowel sounds are heard.  Urinary catheter in place with pinkish urine. Extremities: No cyanosis, clubbing or edema.  Peripheral pulses are palpable.Left fifth toe amputation, right fourth and fifth toe amputation Psych: Alert, awake and oriented, normal mood CNS:  No cranial nerve deficits.  Power equal in all extremities.   Skin: Warm and dry.  No rashes noted.     Data Review: I have personally reviewed the following laboratory data and studies,  CBC: Recent Labs  Lab 06/15/20 0711 06/16/20 1456 06/16/20 2210 06/17/20 0526 06/17/20 2318 06/18/20 0118 06/18/20 0619 06/19/20 0104  WBC 5.9 6.3 8.1 6.6  --  8.4  --  5.7  NEUTROABS 4.8  --   --   --   --   --   --   --   HGB 9.8* 8.0* 7.5* 7.5* 6.6* 6.6* 7.1* 6.3*  HCT 32.2* 26.1* 23.5* 24.7* 20.3* 21.9* 22.8* 19.4*  MCV 88.0 86.7 84.8 87.0  --  88.7  --  87.4  PLT 102* 128* 125* 125*  --  115*  --  510*   Basic Metabolic Panel: Recent Labs  Lab 06/15/20 0711 06/16/20 1456 06/17/20 0526 06/18/20 0118 06/19/20 0104  NA 135 139 140 137 139  K 4.2 4.1 4.6 4.2 4.6  CL 98 100 103 100 100  CO2 28 29 25 26 29   GLUCOSE 148* 140* 119* 219* 181*  BUN 31* 45* 53* 27* 43*  CREATININE 3.67* 5.59* 5.78* 3.87* 5.84*  CALCIUM 9.2 9.4 9.1 8.5* 8.2*  MG  --   --   --  1.9  --    Liver Function Tests: No results for input(s): AST, ALT, ALKPHOS, BILITOT,  PROT, ALBUMIN in the last 168 hours. No results for input(s): LIPASE, AMYLASE in the last 168 hours. No results for input(s): AMMONIA in the last 168 hours. Cardiac Enzymes: No results for input(s): CKTOTAL, CKMB, CKMBINDEX, TROPONINI in the last 168 hours. BNP (last 3 results) No results for input(s): BNP in the last 8760 hours.  ProBNP (last 3 results) No results for input(s): PROBNP in the last 8760 hours.  CBG: Recent Labs  Lab 06/17/20 1353 06/17/20 1959 06/18/20 0623 06/18/20 2019 06/19/20 0554  GLUCAP 113* 221* 153* 151* 143*   Recent Results (from the past 240 hour(s))  Urine Culture     Status: None   Collection Time: 06/15/20 11:59 AM   Specimen: Urine, Random  Result Value Ref Range Status   Specimen Description URINE, RANDOM  Final   Special Requests  NONE  Final   Culture   Final    NO GROWTH Performed at [facility], [place] [address] [place], [place] [address]    Report Status 06/16/2020 FINAL  Final  SARS CORONAVIRUS 2 (TAT 6-24 HRS) Nasopharyngeal Nasopharyngeal Swab     Status: None   Collection Time: 06/16/20  7:12 PM   Specimen: Nasopharyngeal Swab  Result Value Ref Range Status   SARS Coronavirus 2 NEGATIVE NEGATIVE Final    Comment: (NOTE) SARS-CoV-2 target nucleic acids are NOT DETECTED.  The SARS-CoV-2 RNA is generally detectable in upper and lower respiratory specimens during the acute phase of infection. Negative results do not preclude SARS-CoV-2 infection, do not rule out co-infections with other pathogens, and should not be used as the sole basis for treatment or other patient management decisions. Negative results must be combined with clinical observations, patient history, and epidemiological information. The expected result is Negative.  Fact Sheet for Patients: SugarRoll.be  Fact Sheet for Healthcare Providers: https://www.woods-mathews.com/  This test is not yet approved or cleared  by the [place] FDA and  has been authorized for detection and/or diagnosis of SARS-CoV-2 by FDA under an Emergency Use Authorization (EUA). This EUA will remain  in effect (meaning this test can be used) for the duration of the COVID-19 declaration under Se ction 564(b)(1) of the Act, 21 U.S.C. section 360bbb-3(b)(1), unless the authorization is terminated or revoked sooner.  Performed at [facility], [place] [address] [place], [place] [address]      Studies: DG Cystogram  Result Date: 06/18/2020 CLINICAL DATA:  Cystoscopy. EXAM: CYSTOGRAM TECHNIQUE: Retrograde pyelogram. FLUOROSCOPY TIME:  Fluoroscopy Time:  28 seconds Radiation Exposure Index (if provided by the fluoroscopic device): 9.02 mGy Number of Acquired Spot Images: 2 COMPARISON:  None. FINDINGS: Retrograde pyelogram images were obtained bilaterally. The images were not marked laterality. One of the pyelograms demonstrates no filling defects in the visualized collecting system. The other pyelogram demonstrates a filling defect in the proximal ureter. The op report states no filling defects. As a result, the filling defect on this study may represent an air bubble. IMPRESSION: Bilateral pyelograms as above. Electronically Signed   By: Dorise Bullion III M.D   On: 06/18/2020 07:08   DG C-Arm 1-60 Min  Result Date: 06/17/2020 CLINICAL DATA:  Cystogram. EXAM: DG C-ARM 1-60 MIN FLUOROSCOPY TIME:  Fluoroscopy Time:  28 seconds Number of Acquired Spot Images: 2 COMPARISON:  None. FINDINGS: Retrograde pyelogram performed bilaterally. There is an apparent defect in the proximal ureter on 1 side. The side is not marked. However, correlation with the operative note from today indicated no filling defects in this may simply represent an air bubble that was transient. No other defects identified. IMPRESSION: Retrograde pyelogram as above. Electronically Signed   By: Dorise Bullion III M.D   On: 06/17/2020 15:33      Flora Lipps,  MD  [facility] 06/19/2020  If 7PM-7AM, please contact night-coverage

## 2020-06-19 NOTE — Progress Notes (Addendum)
2 Days Post-Op Subjective: AFHDS. Hgb downtrended to 6.3 this morning from 7.1 yesterday. Transfused 1u pRBC. Repeat Hgb pending.  Patient's foley flushed multiple times yesterday/overnight by RN with removal of small/medium-sized clots clots. I flushed his foley with 700cc NS with removal of 50-100cc medium-sized clots. CBI titrated to medium/fast drip with pink returns.   Objective: Vital signs in last 24 hours: Temp:  [97.6 F (36.4 C)-98.4 F (36.9 C)] 97.6 F (36.4 C) (03/14 0656) Pulse Rate:  [68-77] 73 (03/14 0656) Resp:  [13-18] 13 (03/14 0656) BP: (110-139)/(58-65) 129/61 (03/14 0656) SpO2:  [95 %-100 %] 100 % (03/14 0700)  Intake/Output from previous day: 03/13 0701 - 03/14 0700 In: 20254.2 [P.O.:240; Blood:374.7] Out: 33800 [Urine:33800] Intake/Output this shift: No intake/output data recorded.  Physical Exam:  General: Alert and oriented CV: Regular rate Lungs: NWOB on RA GU: 24 Fr 3-way hematuria catheter in placement draining pink urine on moderate/fast drip CBI Ext: Warm and well-perfused   Lab Results: Recent Labs    06/18/20 0118 06/18/20 0619 06/19/20 0104  HGB 6.6* 7.1* 6.3*  HCT 21.9* 22.8* 19.4*   BMET Recent Labs    06/18/20 0118 06/19/20 0104  NA 137 139  K 4.2 4.6  CL 100 100  CO2 26 29  GLUCOSE 219* 181*  BUN 27* 43*  CREATININE 3.87* 5.84*  CALCIUM 8.5* 8.2*     Studies/Results: DG Cystogram  Result Date: 06/18/2020 CLINICAL DATA:  Cystoscopy. EXAM: CYSTOGRAM TECHNIQUE: Retrograde pyelogram. FLUOROSCOPY TIME:  Fluoroscopy Time:  28 seconds Radiation Exposure Index (if provided by the fluoroscopic device): 9.02 mGy Number of Acquired Spot Images: 2 COMPARISON:  None. FINDINGS: Retrograde pyelogram images were obtained bilaterally. The images were not marked laterality. One of the pyelograms demonstrates no filling defects in the visualized collecting system. The other pyelogram demonstrates a filling defect in the proximal ureter.  The op report states no filling defects. As a result, the filling defect on this study may represent an air bubble. IMPRESSION: Bilateral pyelograms as above. Electronically Signed   By: Dorise Bullion III M.D   On: 06/18/2020 07:08   DG C-Arm 1-60 Min  Result Date: 06/17/2020 CLINICAL DATA:  Cystogram. EXAM: DG C-ARM 1-60 MIN FLUOROSCOPY TIME:  Fluoroscopy Time:  28 seconds Number of Acquired Spot Images: 2 COMPARISON:  None. FINDINGS: Retrograde pyelogram performed bilaterally. There is an apparent defect in the proximal ureter on 1 side. The side is not marked. However, correlation with the operative note from today indicated no filling defects in this may simply represent an air bubble that was transient. No other defects identified. IMPRESSION: Retrograde pyelogram as above. Electronically Signed   By: Dorise Bullion III M.D   On: 06/17/2020 15:33    Assessment/Plan: 1. Radiation cystitis 2. Clot retention 3. Gross hematuria  POD2 s/p cystourethroscopy and clot evacuation with fulguration as well as bilateral retrograde pyelograms to complete hematuria workup (negative for upper tract abnormalities). Patient with persistent bleeding requiring tranfusion support (2u pRBC total during hospitalization, last unit this morning) and continued CBI. We are hopeful that his hematuria will resolve over the next few days with continued CBI.   - Titrate CBI to achieve pink urine. If foley catheter stops draining, please disconnect tubing and manually irrigate foley catheter. If unable to irrigate foley catheter, please page urology - Continue B&O suppositories PRN for bladder spasms with foley in place - Continue to trend Hgb. Transfuse as needed - Continue to hold home ASA 81mg  given active hematuria -  We will continue to closely follow. We greatly appreciate Family Medicine's help with this medically complicated patient      LOS: 3 days   I saw pt this AM. Still w/ hematuria. I irrigated ~  100-150 mL of clots til clear.  Will start alum irrigation. Hope that HD can be done w/ tight heparin.

## 2020-06-19 NOTE — Progress Notes (Signed)
Multiple calls to verify correct administration rate of alum.  Confirmed with Dr. Zannie Cove and Baxter Flattery CN [place]. Pt resting with call bell within reach.  Will continue to monitor.

## 2020-06-19 NOTE — Evaluation (Signed)
Physical Therapy Evaluation Patient Details Name: [name] MRN: [id number] DOB: July 11, 1950 Today's Date: 06/19/2020   History of Present Illness  Patient is a 70 y/o male who presents on 06/16/20 with large hematuria with clots s/p cystoscopy, clot evacation and fulgeration of bladder 06/17/20. s/p multiple blood transfusions. 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  Clinical Impression  Patient presents with pain, generalized weakness and impaired mobility s/p above. Pt reports living with family and being Mod I for ADLs PTA. Pt works out 3x/week at home in his gym and uses rollator for ambulation due to poor balance. Pt has a hx of falls due to amputation of right foot and wears BLE braces. Today, pt self limiting with activity due to pain and not wanting to aggravate his bladder discomfort. Discussed importance of mobility, getting OOB for prevention of bed sores, PNA, deconditioning etc Tolerated bed mobility Mod I and sitting EOB for short period but quickly returned to supine due to increased in pain sitting upright. Declined standing or walking today due to above. Encouraged OOB to chair post dialysis. Will follow acutely to maximize independence and mobility as well as assess for any discharge needs prior to return home.     Follow Up Recommendations No PT follow up;Supervision for mobility/OOB (pending progress)    Equipment Recommendations  None recommended by PT    Recommendations for Other Services OT consult     Precautions / Restrictions Precautions Precautions: Fall;Other (comment) Precaution Comments: bladder irrigation, wears LE braces Restrictions Weight Bearing Restrictions: No      Mobility  Bed Mobility Overal bed mobility: Needs Assistance Bed Mobility: Supine to Sit;Sit to Supine     Supine to sit: Modified independent (Device/Increase time);HOB elevated Sit to supine: Modified independent (Device/Increase time);HOB  elevated   General bed mobility comments: Use of rail, no assist needed.    Transfers                 General transfer comment: Pt declined due to pain in abdomen with sitting.  Ambulation/Gait             General Gait Details: Pt declined. "I just want to wait another day."  Stairs            Wheelchair Mobility    Modified Rankin (Stroke Patients Only)       Balance Overall balance assessment: Needs assistance Sitting-balance support: Feet supported;No upper extremity supported Sitting balance-Leahy Scale: Good                                       Pertinent Vitals/Pain Pain Assessment: Faces Faces Pain Scale: Hurts little more Pain Location: lower abdomen/bladder at end of session post mobility Pain Descriptors / Indicators: Guarding;Sore;Pressure Pain Intervention(s): Monitored during session;Repositioned;Limited activity within patient's tolerance;Patient requesting pain meds-RN notified    Home Living Family/patient expects to be discharged to:: Private residence Living Arrangements: Spouse/significant other;Children;Other relatives Available Help at Discharge: Family;Available PRN/intermittently Type of Home: House Home Access: Stairs to enter;Ramped entrance Entrance Stairs-Rails: None Entrance Stairs-Number of Steps: 1 Home Layout: One level;Laundry or work area in basement (gym downstairs) Home Equipment: Environmental consultant - 4 wheels;Shower seat - built in;Cane - single point;Walker - 2 wheels      Prior Function Level of Independence: Independent with assistive device(s)         Comments: Does own ADLs, works  out 3x/week weight training. Owns furniture company. Wife does IADLs. Does HD at home.     Hand Dominance   Dominant Hand: Right    Extremity/Trunk Assessment   Upper Extremity Assessment Upper Extremity Assessment: Defer to OT evaluation    Lower Extremity Assessment Lower Extremity Assessment: RLE  deficits/detail;LLE deficits/detail (Minimal testing due to pt's inability to sit upright and pain) RLE Deficits / Details: Limited ankle AROM with right foot positioned into inversion RLE Sensation: decreased light touch LLE Deficits / Details: Limited ankle AROM. LLE Sensation: decreased light touch       Communication   Communication: No difficulties  Cognition Arousal/Alertness: Awake/alert Behavior During Therapy: WFL for tasks assessed/performed Overall Cognitive Status: Within Functional Limits for tasks assessed                                 General Comments: Self limiting with mobility.      General Comments General comments (skin integrity, edema, etc.): Wife present during session.    Exercises     Assessment/Plan    PT Assessment Patient needs continued PT services  PT Problem List Decreased strength;Decreased mobility;Pain;Impaired sensation;Decreased balance;Decreased activity tolerance;Decreased range of motion       PT Treatment Interventions Therapeutic exercise;Gait training;Balance training;Patient/family education;Therapeutic activities;Functional mobility training;Stair training    PT Goals (Current goals can be found in the Care Plan section)  Acute Rehab PT Goals Patient Stated Goal: to get these clots to stop PT Goal Formulation: With patient Time For Goal Achievement: 07/03/20 Potential to Achieve Goals: Fair    Frequency Min 3X/week   Barriers to discharge        Co-evaluation               AM-PAC PT "6 Clicks" Mobility  Outcome Measure Help needed turning from your back to your side while in a flat bed without using bedrails?: None Help needed moving from lying on your back to sitting on the side of a flat bed without using bedrails?: None Help needed moving to and from a bed to a chair (including a wheelchair)?: A Little Help needed standing up from a chair using your arms (e.g., wheelchair or bedside chair)?: A  Little Help needed to walk in hospital room?: A Little Help needed climbing 3-5 steps with a railing? : A Little 6 Click Score: 20    End of Session   Activity Tolerance: Patient limited by pain;Other (comment) (self limiting) Patient left: in bed;with call bell/phone within reach;with family/visitor present Nurse Communication: Mobility status;Patient requests pain meds PT Visit Diagnosis: Pain;Difficulty in walking, not elsewhere classified (R26.2);Unsteadiness on feet (R26.81) Pain - part of body:  (lower abdomen)    Time: 4854-6270 PT Time Calculation (min) (ACUTE ONLY): 26 min   Charges:   PT Evaluation $PT Eval Moderate Complexity: 1 Mod PT Treatments $Therapeutic Activity: 8-22 mins        Marisa Severin, PT, DPT Acute Rehabilitation Services Pager [phone] Office [phone]      [place] 06/19/2020, 3:24 PM

## 2020-06-19 NOTE — Progress Notes (Signed)
06/18/2020 2320 Received pt to room 4E-24 from 26M.  Pt is A&O, does have C/O pressure in his "penis" irrigated foley and only saw one small clot.  CBI infusing well.  VSS, Tele monitor applied and CCMD notified.  CHG bath given.  Oriented to room, call light and bed.  Call bell in reach. [name]

## 2020-06-19 NOTE — Progress Notes (Signed)
Date and time results received: 06/19/20 0145 (use smartphrase ".now" to insert current time)  Test: Hemoglobin Critical Value: 6.3  Name of Provider Notified: [place]   Orders Received? Or Actions Taken?: 1 unit of blood ordered [name]

## 2020-06-19 NOTE — Progress Notes (Signed)
    Merla Riches, RN  Registered Nurse    Progress Notes     Signed  Date of Service:  06/19/2020 12:45 PM           Signed         Show:Clear all [x] Manual[x] Template[] Copied  Added by: [x] Merla Riches, RN   [] Hover for details  Multiple calls to  [facility] CN, [facility], Dr. [name] and Dr. Jeffie Pollock. Patient with continuous bladder irrigation returning bright pink irrigant. This RN confirmed that irrigation and manual irrigation were normal as this is not common of this unit. Pt complaining of pain and wanting to have foley irrigated every 30-45 minutes. PA at shift change and MD a couple hours later aggressively irrigated foley and instructed this RN to irrigate more forcefully. New orders to start alum. Pt resting with call bell within reach.  Will continue to monitor.            Note Details  Author Merla Riches, RN File Time 06/19/2020 4:56 PM  Author Type Registered Nurse Status Signed  Last Editor Merla Riches, RN Service (none)  [place] # [id number] Admit Date 06/14/2020

## 2020-06-19 NOTE — Progress Notes (Signed)
[facility] ROUNDING NOTE   Subjective:   Brief history: End-stage renal disease on dialysis Monday Wednesday Friday.  History of prostate cancer status post radiation therapy.  Status post cystoscopy and clot removal 06/17/2020 for gross hematuria.  Blood pressure 129/61 pulse 74 temperature 97.6 O2 sats 96% room air  Sodium 139 potassium 4.6 chloride 100 CO2 29 BUN 43 creatinine 5.84 glucose 181 hemoglobin 6.3  Home medications allopurinol 200 mg daily, Calcitrol 0.5 mcg daily, Coreg 12.5 mg twice daily, levothyroxine 112 mcg daily, Cozaar 100 mg daily, Protonix 40 mg daily, Lyrica 150 mg twice daily, Crestor 10 mg daily, Pepcid 20 mg daily   Objective:  Vital signs in last 24 hours:  Temp:  [97.6 F (36.4 C)-98.4 F (36.9 C)] 97.6 F (36.4 C) (03/14 0656) Pulse Rate:  [68-77] 73 (03/14 0656) Resp:  [13-18] 13 (03/14 0656) BP: (110-139)/(58-65) 129/61 (03/14 0656) SpO2:  [95 %-100 %] 100 % (03/14 0700)  Weight change:  Filed Weights   06/16/20 1641  Weight: 95.3 kg    Intake/Output: I/O last 3 completed shifts: In: 70564.7 [P.O.:240; Blood:844.7; GDJME:26834] Out: 19622 [WLNLG:92119]   Intake/Output this shift:  No intake/output data recorded.  General: Well appearing, nad  Heart: RRR no m,r,g Lungs: Clear bilaterally  Abdomen: soft, active bs Extremities: No sig LE edema  Dialysis Access: LUE AVF +bruit    Basic Metabolic Panel: Recent Labs  Lab 06/15/20 0711 06/16/20 1456 06/17/20 0526 06/18/20 0118 06/19/20 0104  NA 135 139 140 137 139  K 4.2 4.1 4.6 4.2 4.6  CL 98 100 103 100 100  CO2 28 29 25 26 29   GLUCOSE 148* 140* 119* 219* 181*  BUN 31* 45* 53* 27* 43*  CREATININE 3.67* 5.59* 5.78* 3.87* 5.84*  CALCIUM 9.2 9.4 9.1 8.5* 8.2*  MG  --   --   --  1.9  --     Liver Function Tests: No results for input(s): AST, ALT, ALKPHOS, BILITOT, PROT, ALBUMIN in the last 168 hours. No results for input(s): LIPASE, AMYLASE in the last 168  hours. No results for input(s): AMMONIA in the last 168 hours.  CBC: Recent Labs  Lab 06/15/20 0711 06/16/20 1456 06/16/20 2210 06/17/20 0526 06/17/20 2318 06/18/20 0118 06/18/20 0619 06/19/20 0104  WBC 5.9 6.3 8.1 6.6  --  8.4  --  5.7  NEUTROABS 4.8  --   --   --   --   --   --   --   HGB 9.8* 8.0* 7.5* 7.5* 6.6* 6.6* 7.1* 6.3*  HCT 32.2* 26.1* 23.5* 24.7* 20.3* 21.9* 22.8* 19.4*  MCV 88.0 86.7 84.8 87.0  --  88.7  --  87.4  PLT 102* 128* 125* 125*  --  115*  --  123*    Cardiac Enzymes: No results for input(s): CKTOTAL, CKMB, CKMBINDEX, TROPONINI in the last 168 hours.  BNP: Invalid input(s): POCBNP  CBG: Recent Labs  Lab 06/17/20 1353 06/17/20 1959 06/18/20 0623 06/18/20 2019 06/19/20 0554  GLUCAP 113* 221* 153* 151* 143*    Microbiology: Results for orders placed or performed during the hospital encounter of 06/16/20  SARS CORONAVIRUS 2 (TAT 6-24 HRS) Nasopharyngeal Nasopharyngeal Swab     Status: None   Collection Time: 06/16/20  7:12 PM   Specimen: Nasopharyngeal Swab  Result Value Ref Range Status   SARS Coronavirus 2 NEGATIVE NEGATIVE Final    Comment: (NOTE) SARS-CoV-2 target nucleic acids are NOT DETECTED.  The SARS-CoV-2 RNA is generally detectable  in upper and lower respiratory specimens during the acute phase of infection. Negative results do not preclude SARS-CoV-2 infection, do not rule out co-infections with other pathogens, and should not be used as the sole basis for treatment or other patient management decisions. Negative results must be combined with clinical observations, patient history, and epidemiological information. The expected result is Negative.  Fact Sheet for Patients: SugarRoll.be  Fact Sheet for Healthcare Providers: https://www.woods-mathews.com/  This test is not yet approved or cleared by the [place] FDA and  has been authorized for detection and/or diagnosis of  SARS-CoV-2 by FDA under an Emergency Use Authorization (EUA). This EUA will remain  in effect (meaning this test can be used) for the duration of the COVID-19 declaration under Se ction 564(b)(1) of the Act, 21 U.S.C. section 360bbb-3(b)(1), unless the authorization is terminated or revoked sooner.  Performed at [facility], [place] [address] [place], [place] [address]     Coagulation Studies: Recent Labs    06/19/20 0104  LABPROT 14.7  INR 1.2    Urinalysis: No results for input(s): COLORURINE, LABSPEC, PHURINE, GLUCOSEU, HGBUR, BILIRUBINUR, KETONESUR, PROTEINUR, UROBILINOGEN, NITRITE, LEUKOCYTESUR in the last 72 hours.  Invalid input(s): APPERANCEUR    Imaging: DG Cystogram  Result Date: 06/18/2020 CLINICAL DATA:  Cystoscopy. EXAM: CYSTOGRAM TECHNIQUE: Retrograde pyelogram. FLUOROSCOPY TIME:  Fluoroscopy Time:  28 seconds Radiation Exposure Index (if provided by the fluoroscopic device): 9.02 mGy Number of Acquired Spot Images: 2 COMPARISON:  None. FINDINGS: Retrograde pyelogram images were obtained bilaterally. The images were not marked laterality. One of the pyelograms demonstrates no filling defects in the visualized collecting system. The other pyelogram demonstrates a filling defect in the proximal ureter. The op report states no filling defects. As a result, the filling defect on this study may represent an air bubble. IMPRESSION: Bilateral pyelograms as above. Electronically Signed   By: Dorise Bullion III M.D   On: 06/18/2020 07:08   DG C-Arm 1-60 Min  Result Date: 06/17/2020 CLINICAL DATA:  Cystogram. EXAM: DG C-ARM 1-60 MIN FLUOROSCOPY TIME:  Fluoroscopy Time:  28 seconds Number of Acquired Spot Images: 2 COMPARISON:  None. FINDINGS: Retrograde pyelogram performed bilaterally. There is an apparent defect in the proximal ureter on 1 side. The side is not marked. However, correlation with the operative note from today indicated no filling defects in this may simply  represent an air bubble that was transient. No other defects identified. IMPRESSION: Retrograde pyelogram as above. Electronically Signed   By: Dorise Bullion III M.D   On: 06/17/2020 15:33     Medications:   . cefTRIAXone (ROCEPHIN)  IV 1 g (06/18/20 2015)  . sodium chloride irrigation     . (feeding supplement) PROSource Plus  30 mL Oral BID BM  . sodium chloride   Intravenous Once  . allopurinol  200 mg Oral Daily  . calcitRIOL  0.5 mcg Oral Daily  . carvedilol  12.5 mg Oral BID WC  . chlorhexidine  15 mL Mouth Rinse Daily  . Chlorhexidine Gluconate Cloth  6 each Topical Daily  . Chlorhexidine Gluconate Cloth  6 each Topical Q0600  . docusate sodium  100 mg Oral BID  . feeding supplement (NEPRO CARB STEADY)  237 mL Oral BID BM  . insulin aspart  0-6 Units Subcutaneous TID WC  . levothyroxine  112 mcg Oral QAC breakfast  . losartan  100 mg Oral Daily  . multivitamin  1 tablet Oral QHS  . pantoprazole  40 mg  Oral Daily  . pregabalin  150 mg Oral BID  . rosuvastatin  10 mg Oral Daily   acetaminophen **OR** acetaminophen, opium-belladonna, calcium carbonate (dosed in mg elemental calcium), camphor-menthol **AND** hydrOXYzine, docusate sodium, feeding supplement (NEPRO CARB STEADY), hydrALAZINE, morphine injection, ondansetron **OR** ondansetron (ZOFRAN) IV, oxyCODONE-acetaminophen, polyvinyl alcohol, sorbitol, zolpidem  Assessment/ Plan:   Dialysis Orders: HomeHD NxStage MTWF EDW 85kg   Assessment/Plan: 1. Gross hematuria - Hx prostate ca s/p radiation therapy. Urology following -s/p cystoscopy/clot removal in OR 3/12.  2. ESRD - Home HD. Missed last treatment. Had HD off schedule 3/12. Continue MWF schedule while here. Next HD 3/14.  3. Hypertension/volume - BP sl elevated. Continue home meds.--Coreg, Losartan, hydralazine.  UF as tolerated.  4. Anemia - Hgb trend down 7.5>6.6 Transfused overnight.   On ESA as outpatient. Received Mircera 150 mcg on 3/8. T 5. Metabolic  bone disease - OP Ca/Phos at goal. Continue home calcitriol.       LOS: [place] @TODAY @7 :44 AM

## 2020-06-19 NOTE — Progress Notes (Signed)
Emptied foley bag and called Dr. [name] to confirm color change from bright pink to darker brown color was acceptable. Due to change in rate of irrigation patient has been more comfortable and not complaining of need to be manually irrigated.  Woke pt to perform manual irrigation as clot seen in tubing.  Patient tolerated well and continues to return pink fluid with manual irrigation. Pt resting with call bell within reach.  Will continue to monitor.

## 2020-06-20 DIAGNOSIS — E11621 Type 2 diabetes mellitus with foot ulcer: Secondary | ICD-10-CM | POA: Diagnosis not present

## 2020-06-20 DIAGNOSIS — R310 Gross hematuria: Secondary | ICD-10-CM | POA: Diagnosis not present

## 2020-06-20 DIAGNOSIS — N186 End stage renal disease: Secondary | ICD-10-CM | POA: Diagnosis not present

## 2020-06-20 DIAGNOSIS — I10 Essential (primary) hypertension: Secondary | ICD-10-CM | POA: Diagnosis not present

## 2020-06-20 LAB — TYPE AND SCREEN
ABO/RH(D): O POS
Antibody Screen: NEGATIVE
Unit division: 0
Unit division: 0
Unit division: 0
Unit division: 0

## 2020-06-20 LAB — IRON AND TIBC
Iron: 58 ug/dL (ref 45–182)
Saturation Ratios: 20 % (ref 17.9–39.5)
TIBC: 288 ug/dL (ref 250–450)
UIBC: 230 ug/dL

## 2020-06-20 LAB — BASIC METABOLIC PANEL
Anion gap: 10 (ref 5–15)
Anion gap: 9 (ref 5–15)
BUN: 43 mg/dL — ABNORMAL HIGH (ref 8–23)
BUN: 54 mg/dL — ABNORMAL HIGH (ref 8–23)
CO2: 29 mmol/L (ref 22–32)
CO2: 26 mmol/L (ref 22–32)
Calcium: 8.2 mg/dL — ABNORMAL LOW (ref 8.9–10.3)
Calcium: 8.4 mg/dL — ABNORMAL LOW (ref 8.9–10.3)
Chloride: 100 mmol/L (ref 98–111)
Chloride: 103 mmol/L (ref 98–111)
Creatinine, Ser: 5.84 mg/dL — ABNORMAL HIGH (ref 0.61–1.24)
Creatinine, Ser: 6.61 mg/dL — ABNORMAL HIGH (ref 0.61–1.24)
GFR, Estimated: 10 mL/min — ABNORMAL LOW (ref >60)
GFR, Estimated: 8 mL/min — ABNORMAL LOW (ref >60)
Glucose, Bld: 181 mg/dL — ABNORMAL HIGH (ref 70–99)
Glucose, Bld: 126 mg/dL — ABNORMAL HIGH (ref 70–99)
Potassium: 4.6 mmol/L (ref 3.5–5.1)
Potassium: 4.9 mmol/L (ref 3.5–5.1)
Sodium: 139 mmol/L (ref 135–145)
Sodium: 138 mmol/L (ref 135–145)

## 2020-06-20 LAB — BPAM RBC
Blood Product Expiration Date: 202203122359
Blood Product Expiration Date: 202203172359
Blood Product Expiration Date: 202204052359
Blood Product Expiration Date: 202204082359
ISSUE DATE / TIME: 202203121147
ISSUE DATE / TIME: 202203130125
ISSUE DATE / TIME: 202203131250
ISSUE DATE / TIME: 202203140423
Unit Type and Rh: 5100
Unit Type and Rh: 5100
Unit Type and Rh: 9500
Unit Type and Rh: 9500

## 2020-06-20 LAB — GLUCOSE, CAPILLARY
Glucose-Capillary: 220 mg/dL — ABNORMAL HIGH (ref 70–99)
Glucose-Capillary: 128 mg/dL — ABNORMAL HIGH (ref 70–99)
Glucose-Capillary: 158 mg/dL — ABNORMAL HIGH (ref 70–99)
Glucose-Capillary: 178 mg/dL — ABNORMAL HIGH (ref 70–99)

## 2020-06-20 LAB — HEMOGLOBIN AND HEMATOCRIT, BLOOD
HCT: 21.7 % — ABNORMAL LOW (ref 39.0–52.0)
HCT: 24.7 % — ABNORMAL LOW (ref 39.0–52.0)
Hemoglobin: 6.7 g/dL — CL (ref 13.0–17.0)
Hemoglobin: 7.9 g/dL — ABNORMAL LOW (ref 13.0–17.0)

## 2020-06-20 LAB — PHOSPHORUS: Phosphorus: 5.9 mg/dL — ABNORMAL HIGH (ref 2.5–4.6)

## 2020-06-20 LAB — PREPARE RBC (CROSSMATCH)

## 2020-06-20 LAB — MAGNESIUM: Magnesium: 2 mg/dL (ref 1.7–2.4)

## 2020-06-20 MED ORDER — OXYBUTYNIN CHLORIDE 5 MG PO TABS
5.0000 mg | ORAL_TABLET | Freq: Three times a day (TID) | ORAL | Status: DC
  Administered 2020-06-20 – 2020-06-24 (×14): 5 mg via ORAL
  Filled 2020-06-20 (×16): qty 1

## 2020-06-20 MED ORDER — DIAZEPAM 5 MG PO TABS
5.0000 mg | ORAL_TABLET | Freq: Three times a day (TID) | ORAL | Status: DC
  Administered 2020-06-20 – 2020-06-26 (×15): 5 mg via ORAL
  Filled 2020-06-20 (×19): qty 1

## 2020-06-20 MED ORDER — DARBEPOETIN ALFA 150 MCG/0.3ML IJ SOSY
150.0000 ug | PREFILLED_SYRINGE | INTRAMUSCULAR | Status: DC
  Administered 2020-06-21: 150 ug via INTRAVENOUS
  Filled 2020-06-20: qty 0.3

## 2020-06-20 MED ORDER — BELLADONNA ALKALOIDS-OPIUM 16.2-60 MG RE SUPP
1.0000 | Freq: Three times a day (TID) | RECTAL | Status: DC
  Administered 2020-06-20 – 2020-06-24 (×8): 1 via RECTAL
  Filled 2020-06-20 (×9): qty 1

## 2020-06-20 MED ORDER — SODIUM CHLORIDE 0.9% IV SOLUTION: Freq: Once | INTRAVENOUS | Status: AC

## 2020-06-20 MED ORDER — OXYBUTYNIN CHLORIDE 5 MG PO TABS
5.0000 mg | ORAL_TABLET | Freq: Three times a day (TID) | ORAL | Status: DC | PRN
  Filled 2020-06-20: qty 1

## 2020-06-20 MED ORDER — CHLORHEXIDINE GLUCONATE CLOTH 2 % EX PADS
6.0000 | MEDICATED_PAD | Freq: Every day | CUTANEOUS | Status: DC
  Administered 2020-06-20 – 2020-06-22 (×3): 6 via TOPICAL

## 2020-06-20 NOTE — Care Management Important Message (Signed)
Important Message  Patient Details  Name: [name] MRN: [id number] Date of Birth: 07-06-1950   Medicare Important Message Given:  Yes     [name] 06/20/2020, 9:12 AM

## 2020-06-20 NOTE — Progress Notes (Signed)
[facility] ROUNDING NOTE   Subjective:   Brief history: End-stage renal disease on dialysis Monday Wednesday Friday.  History of prostate cancer status post radiation therapy.  Status post cystoscopy and clot removal 06/17/2020 for gross hematuria.  Next dialysis treatment 06/21/2020  Blood pressure 139/52 pulse 85 temperature 97.8 O2 sats 90% room air  Sodium 138 potassium 4.9 chloride 103 CO2 26 BUN 54 creatinine 6.61 glucose 126 calcium 8.4 hemoglobin 6.7  Home medications allopurinol 200 mg daily, Calcitrol 0.5 mcg daily, Coreg 12.5 mg twice daily, levothyroxine 112 mcg daily, Cozaar 100 mg daily, Protonix 40 mg daily, Lyrica 150 mg twice daily, Crestor 10 mg daily, Pepcid 20 mg daily   Objective:  Vital signs in last 24 hours:  Temp:  [97.8 F (36.6 C)-98.4 F (36.9 C)] 97.8 F (36.6 C) (03/15 0554) Pulse Rate:  [72-93] 84 (03/15 0518) Resp:  [12-20] 15 (03/15 0518) BP: (107-158)/(48-64) 139/52 (03/15 0518) SpO2:  [96 %-100 %] 99 % (03/15 0537)  Weight change:  Filed Weights   06/16/20 1641  Weight: 95.3 kg    Intake/Output: I/O last 3 completed shifts: In: 56349.7 [P.O.:360; Blood:689.7; BOFBP:10258] Out: [id number] [NIDPO:24235; Other:2000]   Intake/Output this shift:  No intake/output data recorded.  General: Well appearing, nad  Heart: RRR no m,r,g Lungs: Clear bilaterally  Abdomen: soft, active bs Extremities: No sig LE edema  Dialysis Access: LUE AVF +bruit    Basic Metabolic Panel: Recent Labs  Lab 06/16/20 1456 06/17/20 0526 06/18/20 0118 06/19/20 0104 06/20/20 0118  NA 139 140 137 139 138  K 4.1 4.6 4.2 4.6 4.9  CL 100 103 100 100 103  CO2 29 25 26 29 26   GLUCOSE 140* 119* 219* 181* 126*  BUN 45* 53* 27* 43* 54*  CREATININE 5.59* 5.78* 3.87* 5.84* 6.61*  CALCIUM 9.4 9.1 8.5* 8.2* 8.4*  MG  --   --  1.9  --  2.0  PHOS  --   --   --   --  5.9*    Liver Function Tests: No results for input(s): AST, ALT, ALKPHOS, BILITOT, PROT,  ALBUMIN in the last 168 hours. No results for input(s): LIPASE, AMYLASE in the last 168 hours. No results for input(s): AMMONIA in the last 168 hours.  CBC: Recent Labs  Lab 06/15/20 0711 06/16/20 1456 06/16/20 2210 06/17/20 0526 06/17/20 2318 06/18/20 0118 06/18/20 0619 06/19/20 0104 06/19/20 1516 06/20/20 0118  WBC 5.9 6.3 8.1 6.6  --  8.4  --  5.7  --   --   NEUTROABS 4.8  --   --   --   --   --   --   --   --   --   HGB 9.8* 8.0* 7.5* 7.5*   < > 6.6* 7.1* 6.3* 7.1* 6.7*  HCT 32.2* 26.1* 23.5* 24.7*   < > 21.9* 22.8* 19.4* 22.0* 21.7*  MCV 88.0 86.7 84.8 87.0  --  88.7  --  87.4  --   --   PLT 102* 128* 125* 125*  --  115*  --  123*  --   --    < > = values in this interval not displayed.    Cardiac Enzymes: No results for input(s): CKTOTAL, CKMB, CKMBINDEX, TROPONINI in the last 168 hours.  BNP: Invalid input(s): POCBNP  CBG: Recent Labs  Lab 06/18/20 2019 06/19/20 0554 06/19/20 1154 06/19/20 1621 06/20/20 0516  GLUCAP 151* 143* 135* 127* 220*    Microbiology: Results for orders placed  or performed during the hospital encounter of 06/16/20  SARS CORONAVIRUS 2 (TAT 6-24 HRS) Nasopharyngeal Nasopharyngeal Swab     Status: None   Collection Time: 06/16/20  7:12 PM   Specimen: Nasopharyngeal Swab  Result Value Ref Range Status   SARS Coronavirus 2 NEGATIVE NEGATIVE Final    Comment: (NOTE) SARS-CoV-2 target nucleic acids are NOT DETECTED.  The SARS-CoV-2 RNA is generally detectable in upper and lower respiratory specimens during the acute phase of infection. Negative results do not preclude SARS-CoV-2 infection, do not rule out co-infections with other pathogens, and should not be used as the sole basis for treatment or other patient management decisions. Negative results must be combined with clinical observations, patient history, and epidemiological information. The expected result is Negative.  Fact Sheet for  Patients: SugarRoll.be  Fact Sheet for Healthcare Providers: https://www.woods-mathews.com/  This test is not yet approved or cleared by the [place] FDA and  has been authorized for detection and/or diagnosis of SARS-CoV-2 by FDA under an Emergency Use Authorization (EUA). This EUA will remain  in effect (meaning this test can be used) for the duration of the COVID-19 declaration under Se ction 564(b)(1) of the Act, 21 U.S.C. section 360bbb-3(b)(1), unless the authorization is terminated or revoked sooner.  Performed at [facility], [place] [address] [place], [place] [address]     Coagulation Studies: Recent Labs    06/19/20 0104  LABPROT 14.7  INR 1.2    Urinalysis: No results for input(s): COLORURINE, LABSPEC, PHURINE, GLUCOSEU, HGBUR, BILIRUBINUR, KETONESUR, PROTEINUR, UROBILINOGEN, NITRITE, LEUKOCYTESUR in the last 72 hours.  Invalid input(s): APPERANCEUR    Imaging: No results found.   Medications:   . alum bladder irrigation 200 mL/hr at 06/20/20 0621  . sodium chloride irrigation     . (feeding supplement) PROSource Plus  30 mL Oral BID BM  . allopurinol  200 mg Oral Daily  . calcitRIOL  0.5 mcg Oral Daily  . carvedilol  12.5 mg Oral BID WC  . chlorhexidine  15 mL Mouth Rinse Daily  . Chlorhexidine Gluconate Cloth  6 each Topical Daily  . Chlorhexidine Gluconate Cloth  6 each Topical Q0600  . docusate sodium  100 mg Oral BID  . feeding supplement (NEPRO CARB STEADY)  237 mL Oral BID BM  . insulin aspart  0-6 Units Subcutaneous TID WC  . levothyroxine  112 mcg Oral QAC breakfast  . losartan  100 mg Oral Daily  . multivitamin  1 tablet Oral QHS  . pantoprazole  40 mg Oral Daily  . pregabalin  150 mg Oral BID  . rosuvastatin  10 mg Oral Daily   acetaminophen **OR** acetaminophen, opium-belladonna, calcium carbonate (dosed in mg elemental calcium), camphor-menthol **AND** hydrOXYzine, docusate sodium,  feeding supplement (NEPRO CARB STEADY), hydrALAZINE, morphine injection, ondansetron **OR** ondansetron (ZOFRAN) IV, oxyCODONE-acetaminophen, polyvinyl alcohol, sorbitol, zolpidem  Assessment/ Plan:   Dialysis Orders: HomeHD NxStage MTWF EDW 85kg   Assessment/Plan: 1. Gross hematuria - Hx prostate ca s/p radiation therapy. Urology following -s/p cystoscopy/clot removal in OR 3/12.  2. ESRD - Home HD. Missed last treatment. Had HD off schedule 3/12. Continue MWF schedule while here.  Next dialysis treatment will be 06/21/2020  3. Hypertension/volume - BP sl elevated. Continue home meds.--Coreg, Losartan, hydralazine.  UF as tolerated.  4. Anemia -continue to follow trend.  ESA as outpatient.  Will restart ESA check iron studies.  Transfuse as needed 5. Metabolic bone disease - OP Ca/Phos at goal. Continue home calcitriol.  LOS: [place] @TODAY @7 :23 AM

## 2020-06-20 NOTE — Progress Notes (Signed)
PROGRESS NOTE  [name] IOE:[id number] DOB: [birth year] DOA: 06/16/2020 PCP: Lorenda Hatchet, FNP   LOS: 4 days   Brief narrative:  [name] is a 70 y.o. male with medical history significant of prostate CA; hypothyroidism; hypertension, hyperlipidemia, diabetes mellitus and end-stage renal disease on hemodialysis onTuesday Thursday Saturday presented to hospital with hematuria for few days.  Patient had seen urology PA as outpatient but had gotten worse so was brought into the hospital. Urology was consulted and patient underwent Foley catheter placement and a three-way irrigation.  Patient was then admitted to hospital for further evaluation and treatment.  During to large hematuria with clots, patient underwent a cystoscopic evaluation with fulguration on 06/17/2020.  Patient persisted to have blood clots after fulguration so was continued on CBI.  Urology followed the patient during hospitalization.  Assessment/Plan:  Principal Problem:   Gross hematuria Active Problems:   Hypercholesteremia   HTN (hypertension)   Hypothyroidism   Diabetes mellitus ([place])   ESRD (end stage renal disease) ([place])  Gross hematuria-passage of clots Likely secondary to radiation cystitis.  Urology on board and is currently on CBI. Patient underwent cystoscopy with fulguration on 06/17/2020. There is continued hematuria and blood clots.  Urology manually evacuating the clots plus patient is undergoing continuous bladder irrigation.  Continue to follow urology recommendation.  ESRD on home HD Patient on chronic MWThS home HD.  Creatinine 5.8 at this time.  Nephrology on board for hemodialysis.  Acute on chronic anemia Likely exacerbated secondary to acute blood loss from hematuria.  Has baseline anemia of chronic disease and iron deficiency. He receives Mircera injections as outpatient.  Hemoglobin of 6.7 today.  Received 1 unit of packed RBC today.  Patient has received total of 3 units of packed RBC  during this hospitalization  Prostate CA status post external beam radiation in 2018.  Hypothyroidism Continue Synthroid.  TSH of 4.8  Essential hypertension Continue Coreg, Cozaar.  Continue to monitor blood pressure.  Blood pressure is stable at this time.  Hyperlipidemia Continue Crestor  Diabetes mellitus type 2. Hemoglobin A1c of 5.7.  On Ozempic at home..  Continue sliding-scale insulin Accu-Cheks diabetic diet.  Closely monitor blood glucose levels.  Latest POC glucose of 143.  DVT prophylaxis: SCDs Start: 06/16/20 1734   Code Status: Full code  Family Communication:  Spoke with the patient's wife at bedside today.  Status is: Inpatient  Remains inpatient appropriate because:IV treatments appropriate due to intensity of illness or inability to take PO, Inpatient level of care appropriate due to severity of illness and Urology evaluation, CBI, continued hematuria, packed RBC transfusion.   Dispo: The patient is from: Home              Anticipated d/c is to: Home when okay with urology.              Patient currently is not medically stable to d/c.   Difficult to place patient No  Consultants:  Urology  Procedures:  Continuous bladder irrigation  Cystoscopy with fulguration of the bladder on 06/17/2020  Anti-infectives:  Marland Kitchen Rocephin iv 3/11>  Anti-infectives (From admission, onward)   Start     Dose/Rate Route Frequency Ordered Stop   06/16/20 1900  cefTRIAXone (ROCEPHIN) 1 g in sodium chloride 0.9 % 100 mL IVPB  Status:  Discontinued        1 g 200 mL/hr over 30 Minutes Intravenous Every 24 hours 06/16/20 1855 06/20/20 0606     Subjective: Today, patient was  seen and examined at bedside.  Feels frustrated about his hematuria.  Denies any nausea vomiting fever chills or rigor except for penile pain.  Patient's spouse at bedside.  Objective: Vitals:   06/20/20 1307 06/20/20 1325  BP: (!) 213/99 (!) 155/73  Pulse:    Resp: 20 15  Temp: 97.9 F  (36.6 C)   SpO2: 100% 100%    Intake/Output Summary (Last 24 hours) at 06/20/2020 1352 Last data filed at 06/20/2020 1326 Gross per 24 hour  Intake 6930 ml  Output 9550 ml  Net -2620 ml   Filed Weights   06/16/20 1641  Weight: 95.3 kg   Body mass index is 30.13 kg/m.   Physical Exam: General:  Average built, not in obvious distress HENT:   Mild pallor noted.  Oral mucosa is moist.  Chest:  Clear breath sounds.  Diminished breath sounds bilaterally. No crackles or wheezes.  CVS: S1 &S2 heard. No murmur.  Regular rate and rhythm. Abdomen: Soft, nontender, nondistended.  Bowel sounds are heard.  Urinary catheter with pinkish urine Extremities: No cyanosis, clubbing or edema. Left fifth toe amputation, right fourth and fifth toe amputation Psych: Alert, awake and oriented, normal mood CNS:  No cranial nerve deficits.  Power equal in all extremities.   Skin: Warm and dry.   Data Review: I have personally reviewed the following laboratory data and studies,  CBC: Recent Labs  Lab 06/15/20 0711 06/16/20 1456 06/16/20 2210 06/17/20 0526 06/17/20 2318 06/18/20 0118 06/18/20 0619 06/19/20 0104 06/19/20 1516 06/20/20 0118  WBC 5.9 6.3 8.1 6.6  --  8.4  --  5.7  --   --   NEUTROABS 4.8  --   --   --   --   --   --   --   --   --   HGB 9.8* 8.0* 7.5* 7.5*   < > 6.6* 7.1* 6.3* 7.1* 6.7*  HCT 32.2* 26.1* 23.5* 24.7*   < > 21.9* 22.8* 19.4* 22.0* 21.7*  MCV 88.0 86.7 84.8 87.0  --  88.7  --  87.4  --   --   PLT 102* 128* 125* 125*  --  115*  --  123*  --   --    < > = values in this interval not displayed.   Basic Metabolic Panel: Recent Labs  Lab 06/16/20 1456 06/17/20 0526 06/18/20 0118 06/19/20 0104 06/20/20 0118  NA 139 140 137 139 138  K 4.1 4.6 4.2 4.6 4.9  CL 100 103 100 100 103  CO2 29 25 26 29 26   GLUCOSE 140* 119* 219* 181* 126*  BUN 45* 53* 27* 43* 54*  CREATININE 5.59* 5.78* 3.87* 5.84* 6.61*  CALCIUM 9.4 9.1 8.5* 8.2* 8.4*  MG  --   --  1.9  --  2.0   PHOS  --   --   --   --  5.9*   Liver Function Tests: No results for input(s): AST, ALT, ALKPHOS, BILITOT, PROT, ALBUMIN in the last 168 hours. No results for input(s): LIPASE, AMYLASE in the last 168 hours. No results for input(s): AMMONIA in the last 168 hours. Cardiac Enzymes: No results for input(s): CKTOTAL, CKMB, CKMBINDEX, TROPONINI in the last 168 hours. BNP (last 3 results) No results for input(s): BNP in the last 8760 hours.  ProBNP (last 3 results) No results for input(s): PROBNP in the last 8760 hours.  CBG: Recent Labs  Lab 06/19/20 0554 06/19/20 1154 06/19/20 1621 06/20/20 0516 06/20/20 1133  GLUCAP 143* 135* 127* 220* 128*   Recent Results (from the past 240 hour(s))  Urine Culture     Status: None   Collection Time: 06/15/20 11:59 AM   Specimen: Urine, Random  Result Value Ref Range Status   Specimen Description URINE, RANDOM  Final   Special Requests NONE  Final   Culture   Final    NO GROWTH Performed at [facility], [address] [place], [place] [address]    Report Status 06/16/2020 FINAL  Final  SARS CORONAVIRUS 2 (TAT 6-24 HRS) Nasopharyngeal Nasopharyngeal Swab     Status: None   Collection Time: 06/16/20  7:12 PM   Specimen: Nasopharyngeal Swab  Result Value Ref Range Status   SARS Coronavirus 2 NEGATIVE NEGATIVE Final    Comment: (NOTE) SARS-CoV-2 target nucleic acids are NOT DETECTED.  The SARS-CoV-2 RNA is generally detectable in upper and lower respiratory specimens during the acute phase of infection. Negative results do not preclude SARS-CoV-2 infection, do not rule out co-infections with other pathogens, and should not be used as the sole basis for treatment or other patient management decisions. Negative results must be combined with clinical observations, patient history, and epidemiological information. The expected result is Negative.  Fact Sheet for Patients: SugarRoll.be  Fact Sheet  for Healthcare Providers: https://www.woods-mathews.com/  This test is not yet approved or cleared by the [place] FDA and  has been authorized for detection and/or diagnosis of SARS-CoV-2 by FDA under an Emergency Use Authorization (EUA). This EUA will remain  in effect (meaning this test can be used) for the duration of the COVID-19 declaration under Se ction 564(b)(1) of the Act, 21 U.S.C. section 360bbb-3(b)(1), unless the authorization is terminated or revoked sooner.  Performed at [facility], [place] [address], [place], [place] [address]      Studies: No results found.    Flora Lipps, MD  [facility] 06/20/2020  If 7PM-7AM, please contact night-coverage

## 2020-06-20 NOTE — Progress Notes (Signed)
3 Days Post-Op Subjective: AFHDS. Hgb appropriately responded to 1u pRBC yesterday but downtrended again to 6.7 this morning from 7.1 yesterday. Transfusing 1u pRBC this morning. Repeat Hgb pending.  Patient's foley flushed multiple times yesterday/overnight per RN with removal of some small clots. I flushed his foley this morning without return of clots. Urine cherry after flushing, but I was able to titrate CBI to slow/moderate drip with rose pink returns.   Objective: Vital signs in last 24 hours: Temp:  [97.8 F (36.6 C)-98.4 F (36.9 C)] 97.8 F (36.6 C) (03/15 0554) Pulse Rate:  [72-93] 84 (03/15 0518) Resp:  [12-20] 15 (03/15 0518) BP: (107-158)/(48-64) 139/52 (03/15 0518) SpO2:  [96 %-100 %] 99 % (03/15 0537)  Intake/Output from previous day: 03/14 0701 - 03/15 0700 In: 26948 [P.O.:360; Blood:315] Out: 23750 [Urine:21750] Intake/Output this shift: No intake/output data recorded.  Physical Exam:  General: Alert and oriented CV: Regular rate Lungs: NWOB on RA GU: 24 Fr 3-way hematuria catheter in place draining rose pink urine on slow/moderate drip CBI Ext: Warm and well-perfused   Lab Results: Recent Labs    06/19/20 0104 06/19/20 1516 06/20/20 0118  HGB 6.3* 7.1* 6.7*  HCT 19.4* 22.0* 21.7*   BMET Recent Labs    06/19/20 0104 06/20/20 0118  NA 139 138  K 4.6 4.9  CL 100 103  CO2 29 26  GLUCOSE 181* 126*  BUN 43* 54*  CREATININE 5.84* 6.61*  CALCIUM 8.2* 8.4*     Studies/Results: No results found.  Assessment/Plan: 1. Radiation cystitis 2. Clot retention 3. Gross hematuria  POD3 s/p cystourethroscopy and clot evacuation with fulguration as well as bilateral retrograde pyelograms to complete hematuria workup (negative for upper tract abnormalities). Patient with persistent bleeding requiring tranfusion support (3u pRBC total during hospitalization, last unit this morning) and continued CBI. Started Alum intravesical irrigation on 3/14. We are  hopeful that his hematuria will resolve over the next few days with continued CBI + Alum.   - Titrate CBI + Alum to achieve pink urine. If foley catheter stops draining, please disconnect tubing and manually irrigate foley catheter. If unable to irrigate foley catheter, please page urology - Continue B&O suppositories PRN for bladder spasms with foley in place - Continue to trend Hgb. Transfuse as needed - Continue to hold home ASA 81mg  given active hematuria - We will continue to closely follow. We greatly appreciate Family Medicine's help with this medically complicated patient      LOS: 4 days

## 2020-06-20 NOTE — Progress Notes (Signed)
HOSPITAL MEDICINE OVERNIGHT EVENT NOTE    Notified by nursing that patient's repeat hemoglobin this morning is 6.7.  Hemoglobin repeatedly downtrends due to ongoing hematuria.  Nursing reports that patient is exhibiting rose-colored urine with continued clots.  Patient is hemodynamically stable.  Patient denies shortness of breath or chest pain.  Patient continues to receive continuous bladder irrigation.  1 unit packed red blood cell transfusion ordered with posttransfusion H&H afterwards.  Vernelle Emerald  MD [facility]

## 2020-06-20 NOTE — Progress Notes (Signed)
PT Cancellation Note  Patient Details Name: Roney Youtz MRN: [id number] DOB: 1951/03/27   Cancelled Treatment:    Reason Eval/Treat Not Completed: (P) Pain limiting ability to participate (pt reporting severe discomfort from bladder spasms, deferred PT session at this time.) Will continue efforts next date per PT POC as schedule permits.   Kara Pacer Hezikiah Retzloff 06/20/2020, 5:50 PM

## 2020-06-20 NOTE — Progress Notes (Signed)
Occupational Therapy Evaluation Patient Details Name: [name] MRN: [id number] DOB: [birth year] Today's Date: 06/20/2020    History of Present Illness Patient is a 70 y/o male who presents on 06/16/20 with large hematuria with clots s/p cystoscopy, clot evacation and fulgeration of bladder 06/17/20. s/p multiple blood transfusions. 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   Clinical Impression   Pt lives at home with wife, grossly mod Indep with ADL's and mobility with use of B LE braces and rollator for mobility. Minimal management of stairs for needs. Wife manages home HD Sun/mon/wed/thurs. Despite encouragement pt declining OOB as 'moving irritates my pain'. After bed level assessment pt with no significant deficits present that would limit ability to participate with ADL's. ?activity tolerance but strength and coordination intact. Anticipate pt to be able to return to home with wife S/A but OT will continue to follow for 1-2 more session for self care retraining and assessment of safety with self care transfers. Recommendations listed below.     Follow Up Recommendations  Supervision/Assistance - 24 hour    Equipment Recommendations  None recommended by OT    Recommendations for Other Services       Precautions / Restrictions Precautions Precaution Comments: has B LE braces at baseline, uses Rollator for mobility, and currently attached to bladder irrigation. Restrictions Weight Bearing Restrictions: No      Mobility Bed Mobility Overal bed mobility: Needs Assistance             General bed mobility comments: demo's ability to reposition and roll at bed level for improved comfort with mod indep. no reliance on rail observed    Transfers                 General transfer comment: pt declining OOB    Balance                                           ADL either performed or assessed with clinical judgement    ADL Overall ADL's : Needs assistance/impaired Eating/Feeding: Set up   Grooming: Set up   Upper Body Bathing: Set up   Lower Body Bathing: Minimal assistance;Bed level   Upper Body Dressing : Set up   Lower Body Dressing: Minimal assistance                 General ADL Comments: at thist ime full ADL and transfer assessment limited d/t pt self limiting participation (knowingly) during evaluation. endorsing discomfort wtih transfers and deferring transition OOB. overall appears with no acute focal deficits limiting ability to participate wtih ADL's, at bed level pt requiring set up for UB and min A for LB limited by discomfort with hip flexion. will continue with safety assessment at next session if agreeable to [place] Vision/History: No visual deficits;Wears glasses Wears Glasses: Reading only       Perception     Praxis      Pertinent Vitals/Pain Pain Assessment: 0-10 Pain Score: 4  Pain Location: lower abdomen/bladder at end of session post mobility Pain Descriptors / Indicators: Guarding;Sore;Pressure Pain Intervention(s): Limited activity within patient's tolerance     Hand Dominance Right   Extremity/Trunk Assessment Upper Extremity Assessment Upper Extremity Assessment: Overall WFL for tasks assessed   Lower Extremity Assessment Lower Extremity Assessment: Defer to PT evaluation  Communication Communication Communication: No difficulties   Cognition Arousal/Alertness: Awake/alert Behavior During Therapy: WFL for tasks assessed/performed Overall Cognitive Status: Within Functional Limits for tasks assessed                                 General Comments: pt self limiting by choice, d/t 'just wanting to stay comfortable'   General Comments  wife present during session    Exercises     Shoulder Instructions      Home Living Family/patient expects to be discharged to:: Private residence Living  Arrangements: Spouse/significant other;Children;Other relatives Available Help at Discharge: Family;Available PRN/intermittently Type of Home: House Home Access: Stairs to enter;Ramped entrance Entrance Stairs-Number of Steps: 1 Entrance Stairs-Rails: None Home Layout: Multi-level;Laundry or work area in basement     [employer] Shower/Tub: Occupational psychologist: Standard Bathroom Accessibility: Yes How Accessible: Accessible via walker;Accessible via wheelchair Home Equipment: [name] - 4 wheels;Shower seat - built in;Cane - single point;Walker - 2 wheels   Additional Comments: wife assists with set up of home HD      Prior Functioning/Environment Level of Independence: Independent with assistive device(s)        Comments: pt endorses being mod indep with all ADL's with use of rollator for stability, works out 3x/week at least on "off days" of HD.        OT Problem List: Decreased activity tolerance;Impaired balance (sitting and/or standing);Decreased safety awareness;Pain      OT Treatment/Interventions:      OT Goals(Current goals can be found in the care plan section) Acute Rehab OT Goals Patient Stated Goal: to stop bleeding and go home OT Goal Formulation: With patient Time For Goal Achievement: 07/04/20 Potential to Achieve Goals: Good ADL Goals Additional ADL Goal #1: Pt will be mod indep with completion of all basic ADL's and self care transfers for decreased risk of falls and caregiver burden  OT Frequency:     Barriers to D/C:            Co-evaluation              AM-PAC OT "6 Clicks" Daily Activity     Outcome Measure Help from another person eating meals?: None Help from another person taking care of personal grooming?: A Little Help from another person toileting, which includes using toliet, bedpan, or urinal?: A Lot Help from another person bathing (including washing, rinsing, drying)?: A Little Help from another person to put on and  taking off regular upper body clothing?: A Little Help from another person to put on and taking off regular lower body clothing?: A Little 6 Click Score: 18   End of Session Nurse Communication: Other (comment) (of status at end of session)  Activity Tolerance: Patient limited by pain Patient left: in bed;with family/visitor present;with call bell/phone within reach  OT Visit Diagnosis: Muscle weakness (generalized) (M62.81)                Time: 1055-1110 OT Time Calculation (min): 15 min Charges:  OT General Charges $OT Visit: 1 Visit OT Evaluation $OT Eval Low Complexity: 1 Low  Teodoro Jeffreys OTR/L acute rehab services Office: [phone]  06/20/2020, 2:36 PM

## 2020-06-21 DIAGNOSIS — E11621 Type 2 diabetes mellitus with foot ulcer: Secondary | ICD-10-CM | POA: Diagnosis not present

## 2020-06-21 DIAGNOSIS — R310 Gross hematuria: Secondary | ICD-10-CM | POA: Diagnosis not present

## 2020-06-21 DIAGNOSIS — N186 End stage renal disease: Secondary | ICD-10-CM | POA: Diagnosis not present

## 2020-06-21 DIAGNOSIS — I10 Essential (primary) hypertension: Secondary | ICD-10-CM | POA: Diagnosis not present

## 2020-06-21 LAB — TYPE AND SCREEN
ABO/RH(D): O POS
Antibody Screen: NEGATIVE
Unit division: 0

## 2020-06-21 LAB — RENAL FUNCTION PANEL
Albumin: 2.9 g/dL — ABNORMAL LOW (ref 3.5–5.0)
Anion gap: 11 (ref 5–15)
BUN: 40 mg/dL — ABNORMAL HIGH (ref 8–23)
CO2: 24 mmol/L (ref 22–32)
Calcium: 8.3 mg/dL — ABNORMAL LOW (ref 8.9–10.3)
Chloride: 104 mmol/L (ref 98–111)
Creatinine, Ser: 5.28 mg/dL — ABNORMAL HIGH (ref 0.61–1.24)
GFR, Estimated: 11 mL/min — ABNORMAL LOW (ref >60)
Glucose, Bld: 201 mg/dL — ABNORMAL HIGH (ref 70–99)
Phosphorus: 4.6 mg/dL (ref 2.5–4.6)
Potassium: 5 mmol/L (ref 3.5–5.1)
Sodium: 139 mmol/L (ref 135–145)

## 2020-06-21 LAB — CBC
HCT: 22.1 % — ABNORMAL LOW (ref 39.0–52.0)
Hemoglobin: 7.2 g/dL — ABNORMAL LOW (ref 13.0–17.0)
MCH: 28.7 pg (ref 26.0–34.0)
MCHC: 32.6 g/dL (ref 30.0–36.0)
MCV: 88 fL (ref 80.0–100.0)
Platelets: 130 10*3/uL — ABNORMAL LOW (ref 150–400)
RBC: 2.51 MIL/uL — ABNORMAL LOW (ref 4.22–5.81)
RDW: 17.2 % — ABNORMAL HIGH (ref 11.5–15.5)
WBC: 6.5 10*3/uL (ref 4.0–10.5)
nRBC: 0 % (ref 0.0–0.2)

## 2020-06-21 LAB — BPAM RBC
Blood Product Expiration Date: 202204112359
ISSUE DATE / TIME: 202203150522
Unit Type and Rh: 5100

## 2020-06-21 LAB — GLUCOSE, CAPILLARY
Glucose-Capillary: 153 mg/dL — ABNORMAL HIGH (ref 70–99)
Glucose-Capillary: 187 mg/dL — ABNORMAL HIGH (ref 70–99)
Glucose-Capillary: 173 mg/dL — ABNORMAL HIGH (ref 70–99)

## 2020-06-21 MED ORDER — ALTEPLASE 2 MG IJ SOLR: 2.0000 mg | Freq: Once | INTRAMUSCULAR | Status: DC | PRN

## 2020-06-21 MED ORDER — LIDOCAINE HCL (PF) 1 % IJ SOLN: 5.0000 mL | INTRAMUSCULAR | Status: DC | PRN

## 2020-06-21 MED ORDER — SODIUM CHLORIDE 0.9 % IV SOLN: 100.0000 mL | INTRAVENOUS | Status: DC | PRN

## 2020-06-21 MED ORDER — PENTAFLUOROPROP-TETRAFLUOROETH EX AERO: 1.0000 "application " | INHALATION_SPRAY | CUTANEOUS | Status: DC | PRN

## 2020-06-21 MED ORDER — HEPARIN SODIUM (PORCINE) 1000 UNIT/ML DIALYSIS: 1000.0000 [IU] | INTRAMUSCULAR | Status: DC | PRN

## 2020-06-21 MED ORDER — SODIUM CHLORIDE 0.9 % IR SOLN
Status: AC
  Filled 2020-06-21 (×3): qty 30

## 2020-06-21 MED ORDER — HEPARIN SODIUM (PORCINE) 1000 UNIT/ML DIALYSIS
1000.0000 [IU] | INTRAMUSCULAR | Status: DC | PRN
  Filled 2020-06-21: qty 1

## 2020-06-21 MED ORDER — DARBEPOETIN ALFA 150 MCG/0.3ML IJ SOSY
PREFILLED_SYRINGE | INTRAMUSCULAR | Status: AC
  Filled 2020-06-21: qty 0.3

## 2020-06-21 MED ORDER — LIDOCAINE-PRILOCAINE 2.5-2.5 % EX CREA: 1.0000 "application " | TOPICAL_CREAM | CUTANEOUS | Status: DC | PRN

## 2020-06-21 MED ORDER — LIDOCAINE-PRILOCAINE 2.5-2.5 % EX CREA
1.0000 "application " | TOPICAL_CREAM | CUTANEOUS | Status: DC | PRN
  Filled 2020-06-21: qty 5

## 2020-06-21 MED ORDER — CALCITRIOL 0.5 MCG PO CAPS
ORAL_CAPSULE | ORAL | Status: AC
  Filled 2020-06-21: qty 1

## 2020-06-21 NOTE — Progress Notes (Signed)
PROGRESS NOTE  [name] WJX:[id number] DOB: 11-04-1950 DOA: 06/16/2020 PCP: Lorenda Hatchet, FNP   LOS: 5 days   Brief narrative:  [name] is a 70 y.o. male with medical history significant of prostate CA; hypothyroidism; hypertension, hyperlipidemia, diabetes mellitus and end-stage renal disease on hemodialysis onTuesday Thursday Saturday presented to hospital with hematuria for few days.  Patient had seen urology PA as outpatient but had gotten worse so was brought into the hospital. Urology was consulted and patient underwent Foley catheter placement and a three-way irrigation.  Patient was then admitted to hospital for further evaluation and treatment.  During hospitalization, patient continued to have large hematuria with clots, patient underwent a cystoscopic evaluation with fulguration on 06/17/2020.  Patient persisted to have blood clots after fulguration so was continued on CBI.  Urology followed the patient during hospitalization.  Assessment/Plan:  Principal Problem:   Gross hematuria Active Problems:   Hypercholesteremia   HTN (hypertension)   Hypothyroidism   Diabetes mellitus ([place])   ESRD (end stage renal disease) ([place])  Gross hematuria-passage of clots Likely secondary to radiation cystitis.  Urology on board and is currently on CBI. Patient underwent cystoscopy with fulguration on 06/17/2020.   Continue to follow urology recommendation.  ESRD on home HD Patient on chronic [facility] home HD.  Creatinine 5.8 at this time.  Nephrology on board for hemodialysis.  Seen during hemodialysis today.  Acute on chronic anemia Likely exacerbated secondary to acute blood loss from hematuria.  Has baseline anemia of chronic disease and iron deficiency. He receives Mircera injections as outpatient.  Hemoglobin of 6.7 today.   Patient has received total of 3 units of packed RBC during this hospitalization.  Hemoglobin today at 7.2.  Prostate CA status post external beam radiation  in 2018.  Hypothyroidism Continue Synthroid.  TSH of 4.8  Essential hypertension Continue Coreg, Cozaar.  Continue to monitor blood pressure.  Blood pressure is stable at this time.  Hyperlipidemia Continue Crestor  Diabetes mellitus type 2. Hemoglobin A1c of 5.7.  On Ozempic at home.  Continue sliding-scale insulin Accu-Cheks diabetic diet.  Closely monitor blood glucose levels.  Latest POC glucose of 153.  DVT prophylaxis: SCDs Start: 06/16/20 1734   Code Status: Full code  Family Communication:  None today.  Status is: Inpatient  Remains inpatient appropriate because:IV treatments appropriate due to intensity of illness or inability to take PO, Inpatient level of care appropriate due to severity of illness and Urology evaluation, CBI, continued hematuria   Dispo: The patient is from: Home              Anticipated d/c is to: Home when okay with urology.              Patient currently is not medically stable to d/c.   Difficult to place patient No  Consultants:  Urology  Procedures:  Continuous bladder irrigation  Cystoscopy with fulguration of the bladder on 06/17/2020  Anti-infectives:  Marland Kitchen Rocephin iv 3/11>  Anti-infectives (From admission, onward)   Start     Dose/Rate Route Frequency Ordered Stop   06/16/20 1900  cefTRIAXone (ROCEPHIN) 1 g in sodium chloride 0.9 % 100 mL IVPB  Status:  Discontinued        1 g 200 mL/hr over 30 Minutes Intravenous Every 24 hours 06/16/20 1855 06/20/20 0606     Subjective: Today, patient was seen and examined at bedside.  Patient feels little better today with bladder spasm.  Hematuria slightly clearing up.  Objective: Vitals:   06/21/20 1330 06/21/20 1351  BP: 133/64 (!) 141/79  Pulse:    Resp: 15 13  Temp:  98 F (36.7 C)  SpO2:  96%    Intake/Output Summary (Last 24 hours) at 06/21/2020 1449 Last data filed at 06/21/2020 1436 Gross per 24 hour  Intake 4090 ml  Output 18650 ml  Net -14560 ml   Filed  Weights   06/16/20 1641 06/21/20 1000  Weight: 95.3 kg 88.5 kg   Body mass index is 27.99 kg/m.   Physical Exam: General:  Average built, not in obvious distress HENT:   Mild pallor noted, oral mucosa is moist.  Chest:  Clear breath sounds.  Diminished breath sounds bilaterally. No crackles or wheezes.  CVS: S1 &S2 heard. No murmur.  Regular rate and rhythm. Abdomen: Soft, nontender, nondistended.  Bowel sounds are heard.  Urinary catheter with pinkish urine Extremities: No cyanosis, clubbing or edema. Left fifth toe amputation, right fourth and fifth toe amputation Psych: Alert, awake and oriented, normal mood CNS:  No cranial nerve deficits.  Power equal in all extremities.   Skin: Warm and dry.  No rashes noted.   Data Review: I have personally reviewed the following laboratory data and studies,  CBC: Recent Labs  Lab 06/15/20 0711 06/16/20 1456 06/16/20 2210 06/17/20 0526 06/17/20 2318 06/18/20 0118 06/18/20 0619 06/19/20 0104 06/19/20 1516 06/20/20 0118 06/20/20 1650 06/21/20 0840  WBC 5.9   < > 8.1 6.6  --  8.4  --  5.7  --   --   --  6.5  NEUTROABS 4.8  --   --   --   --   --   --   --   --   --   --   --   HGB 9.8*   < > 7.5* 7.5*   < > 6.6*   < > 6.3* 7.1* 6.7* 7.9* 7.2*  HCT 32.2*   < > 23.5* 24.7*   < > 21.9*   < > 19.4* 22.0* 21.7* 24.7* 22.1*  MCV 88.0   < > 84.8 87.0  --  88.7  --  87.4  --   --   --  88.0  PLT 102*   < > 125* 125*  --  115*  --  123*  --   --   --  130*   < > = values in this interval not displayed.   Basic Metabolic Panel: Recent Labs  Lab 06/17/20 0526 06/18/20 0118 06/19/20 0104 06/20/20 0118 06/21/20 0840  NA 140 137 139 138 139  K 4.6 4.2 4.6 4.9 5.0  CL 103 100 100 103 104  CO2 25 26 29 26 24   GLUCOSE 119* 219* 181* 126* 201*  BUN 53* 27* 43* 54* 40*  CREATININE 5.78* 3.87* 5.84* 6.61* 5.28*  CALCIUM 9.1 8.5* 8.2* 8.4* 8.3*  MG  --  1.9  --  2.0  --   PHOS  --   --   --  5.9* 4.6   Liver Function Tests: Recent Labs   Lab 06/21/20 0840  ALBUMIN 2.9*   No results for input(s): LIPASE, AMYLASE in the last 168 hours. No results for input(s): AMMONIA in the last 168 hours. Cardiac Enzymes: No results for input(s): CKTOTAL, CKMB, CKMBINDEX, TROPONINI in the last 168 hours. BNP (last 3 results) No results for input(s): BNP in the last 8760 hours.  ProBNP (last 3 results) No results for input(s): PROBNP in the last 8760 hours.  CBG: Recent Labs  Lab 06/20/20 0516 06/20/20 1133 06/20/20 1704 06/20/20 2102 06/21/20 0612  GLUCAP 220* 128* 158* 178* 153*   Recent Results (from the past 240 hour(s))  Urine Culture     Status: None   Collection Time: 06/15/20 11:59 AM   Specimen: Urine, Random  Result Value Ref Range Status   Specimen Description URINE, RANDOM  Final   Special Requests NONE  Final   Culture   Final    NO GROWTH Performed at [facility], [place] [address], [place], [place] [address]    Report Status 06/16/2020 FINAL  Final  SARS CORONAVIRUS 2 (TAT 6-24 HRS) Nasopharyngeal Nasopharyngeal Swab     Status: None   Collection Time: 06/16/20  7:12 PM   Specimen: Nasopharyngeal Swab  Result Value Ref Range Status   SARS Coronavirus 2 NEGATIVE NEGATIVE Final    Comment: (NOTE) SARS-CoV-2 target nucleic acids are NOT DETECTED.  The SARS-CoV-2 RNA is generally detectable in upper and lower respiratory specimens during the acute phase of infection. Negative results do not preclude SARS-CoV-2 infection, do not rule out co-infections with other pathogens, and should not be used as the sole basis for treatment or other patient management decisions. Negative results must be combined with clinical observations, patient history, and epidemiological information. The expected result is Negative.  Fact Sheet for Patients: SugarRoll.be  Fact Sheet for Healthcare Providers: https://www.woods-mathews.com/  This test is not yet approved or cleared  by the [place] FDA and  has been authorized for detection and/or diagnosis of SARS-CoV-2 by FDA under an Emergency Use Authorization (EUA). This EUA will remain  in effect (meaning this test can be used) for the duration of the COVID-19 declaration under Se ction 564(b)(1) of the Act, 21 U.S.C. section 360bbb-3(b)(1), unless the authorization is terminated or revoked sooner.  Performed at [facility], [place] [address] [place], [place] [address]      Studies: No results found.    Flora Lipps, MD  [facility] 06/21/2020  If 7PM-7AM, please contact night-coverage

## 2020-06-21 NOTE — Progress Notes (Signed)
PT Cancellation Note  Patient Details Name: Ramere Downs MRN: [id number] DOB: 1951/03/10   Cancelled Treatment:    Reason Eval/Treat Not Completed: (P) Patient at procedure or test/unavailable (pt at HD dept) Will continue efforts per PT POC as schedule permits.   Omarri Eich M Minna Dumire 06/21/2020, 10:08 AM

## 2020-06-21 NOTE — Progress Notes (Signed)
[facility] ROUNDING NOTE   Subjective:   Brief history: End-stage renal disease on dialysis Monday Wednesday Friday.  History of prostate cancer status post radiation therapy.  Status post cystoscopy and clot removal 06/17/2020 for gross hematuria.  Next dialysis treatment 06/21/2020  Blood pressure 142/61 pulse 82 temperature 97.8 O2 sats 100% room air  Sodium 138 potassium 4.9 chloride 103 CO2 26 BUN 34 creatinine 6.6 glucose 126 calcium 8.4 iron saturation is 20% hemoglobin 7.9  Home medications allopurinol 200 mg daily, Calcitrol 0.5 mcg daily, Coreg 12.5 mg twice daily, levothyroxine 112 mcg daily, Cozaar 100 mg daily, Protonix 40 mg daily, Lyrica 150 mg twice daily, Crestor 10 mg daily, Pepcid 20 mg daily   Objective:  Vital signs in last 24 hours:  Temp:  [97.8 F (36.6 C)-98.7 F (37.1 C)] 97.8 F (36.6 C) (03/16 0309) Pulse Rate:  [76-98] 79 (03/16 0309) Resp:  [13-20] 17 (03/16 0309) BP: (119-213)/(53-99) 142/61 (03/16 0309) SpO2:  [96 %-100 %] 100 % (03/16 0309)  Weight change:  Filed Weights   06/16/20 1641  Weight: 95.3 kg    Intake/Output: I/O last 3 completed shifts: In: 8280 [P.O.:450; Blood:630; Other:7200] Out: 20550 [YJEHU:31497; Other:2000]   Intake/Output this shift:  No intake/output data recorded.  General: Well appearing, nad  Heart: RRR no m,r,g Lungs: Clear bilaterally  Abdomen: soft, active bs Extremities: No sig LE edema  Dialysis Access: LUE AVF +bruit    Basic Metabolic Panel: Recent Labs  Lab 06/16/20 1456 06/17/20 0526 06/18/20 0118 06/19/20 0104 06/20/20 0118  NA 139 140 137 139 138  K 4.1 4.6 4.2 4.6 4.9  CL 100 103 100 100 103  CO2 29 25 26 29 26   GLUCOSE 140* 119* 219* 181* 126*  BUN 45* 53* 27* 43* 54*  CREATININE 5.59* 5.78* 3.87* 5.84* 6.61*  CALCIUM 9.4 9.1 8.5* 8.2* 8.4*  MG  --   --  1.9  --  2.0  PHOS  --   --   --   --  5.9*    Liver Function Tests: No results for input(s): AST, ALT, ALKPHOS,  BILITOT, PROT, ALBUMIN in the last 168 hours. No results for input(s): LIPASE, AMYLASE in the last 168 hours. No results for input(s): AMMONIA in the last 168 hours.  CBC: Recent Labs  Lab 06/15/20 0711 06/16/20 1456 06/16/20 2210 06/17/20 0526 06/17/20 2318 06/18/20 0118 06/18/20 0619 06/19/20 0104 06/19/20 1516 06/20/20 0118 06/20/20 1650  WBC 5.9 6.3 8.1 6.6  --  8.4  --  5.7  --   --   --   NEUTROABS 4.8  --   --   --   --   --   --   --   --   --   --   HGB 9.8* 8.0* 7.5* 7.5*   < > 6.6* 7.1* 6.3* 7.1* 6.7* 7.9*  HCT 32.2* 26.1* 23.5* 24.7*   < > 21.9* 22.8* 19.4* 22.0* 21.7* 24.7*  MCV 88.0 86.7 84.8 87.0  --  88.7  --  87.4  --   --   --   PLT 102* 128* 125* 125*  --  115*  --  123*  --   --   --    < > = values in this interval not displayed.    Cardiac Enzymes: No results for input(s): CKTOTAL, CKMB, CKMBINDEX, TROPONINI in the last 168 hours.  BNP: Invalid input(s): POCBNP  CBG: Recent Labs  Lab 06/20/20 0516 06/20/20 1133 06/20/20  1704 06/20/20 2102 06/21/20 0612  GLUCAP 220* 128* 158* 178* 153*    Microbiology: Results for orders placed or performed during the hospital encounter of 06/16/20  SARS CORONAVIRUS 2 (TAT 6-24 HRS) Nasopharyngeal Nasopharyngeal Swab     Status: None   Collection Time: 06/16/20  7:12 PM   Specimen: Nasopharyngeal Swab  Result Value Ref Range Status   SARS Coronavirus 2 NEGATIVE NEGATIVE Final    Comment: (NOTE) SARS-CoV-2 target nucleic acids are NOT DETECTED.  The SARS-CoV-2 RNA is generally detectable in upper and lower respiratory specimens during the acute phase of infection. Negative results do not preclude SARS-CoV-2 infection, do not rule out co-infections with other pathogens, and should not be used as the sole basis for treatment or other patient management decisions. Negative results must be combined with clinical observations, patient history, and epidemiological information. The expected result is  Negative.  Fact Sheet for Patients: SugarRoll.be  Fact Sheet for Healthcare Providers: https://www.woods-mathews.com/  This test is not yet approved or cleared by the [place] FDA and  has been authorized for detection and/or diagnosis of SARS-CoV-2 by FDA under an Emergency Use Authorization (EUA). This EUA will remain  in effect (meaning this test can be used) for the duration of the COVID-19 declaration under Se ction 564(b)(1) of the Act, 21 U.S.C. section 360bbb-3(b)(1), unless the authorization is terminated or revoked sooner.  Performed at [facility], [place] [address], [place], [place] [address]     Coagulation Studies: Recent Labs    06/19/20 0104  LABPROT 14.7  INR 1.2    Urinalysis: No results for input(s): COLORURINE, LABSPEC, PHURINE, GLUCOSEU, HGBUR, BILIRUBINUR, KETONESUR, PROTEINUR, UROBILINOGEN, NITRITE, LEUKOCYTESUR in the last 72 hours.  Invalid input(s): APPERANCEUR    Imaging: No results found.   Medications:   . alum bladder irrigation 200 mL/hr at 06/20/20 2030  . sodium chloride irrigation     . (feeding supplement) PROSource Plus  30 mL Oral BID BM  . allopurinol  200 mg Oral Daily  . opium-belladonna  1 suppository Rectal Q8H  . calcitRIOL  0.5 mcg Oral Daily  . carvedilol  12.5 mg Oral BID WC  . chlorhexidine  15 mL Mouth Rinse Daily  . Chlorhexidine Gluconate Cloth  6 each Topical Daily  . Chlorhexidine Gluconate Cloth  6 each Topical Q0600  . Chlorhexidine Gluconate Cloth  6 each Topical Q0600  . darbepoetin (ARANESP) injection - DIALYSIS  150 mcg Intravenous Q Wed-HD  . diazepam  5 mg Oral Q8H  . docusate sodium  100 mg Oral BID  . feeding supplement (NEPRO CARB STEADY)  237 mL Oral BID BM  . insulin aspart  0-6 Units Subcutaneous TID WC  . levothyroxine  112 mcg Oral QAC breakfast  . losartan  100 mg Oral Daily  . multivitamin  1 tablet Oral QHS  . oxybutynin  5 mg Oral TID   . pantoprazole  40 mg Oral Daily  . pregabalin  150 mg Oral BID  . rosuvastatin  10 mg Oral Daily   acetaminophen **OR** acetaminophen, calcium carbonate (dosed in mg elemental calcium), camphor-menthol **AND** hydrOXYzine, docusate sodium, feeding supplement (NEPRO CARB STEADY), hydrALAZINE, morphine injection, ondansetron **OR** ondansetron (ZOFRAN) IV, oxyCODONE-acetaminophen, polyvinyl alcohol, sorbitol, zolpidem  Assessment/ Plan:   Dialysis Orders: HomeHD NxStage MTWF EDW 85kg   Assessment/Plan: 1. Gross hematuria - Hx prostate ca s/p radiation therapy. Urology following -s/p cystoscopy/clot removal in OR 3/12.  2. ESRD - Home HD. Missed last treatment. Had  HD off schedule 3/12. Continue MWF schedule while here.  Next dialysis treatment will be 06/21/2020  3. Hypertension/volume - BP sl elevated. Continue home meds.--Coreg, Losartan, hydralazine.  UF as tolerated.  4. Anemia -continue to follow trend.  ESA as outpatient.  Will restart ESA check iron studies.  Transfuse as needed 5. Metabolic bone disease - OP Ca/Phos at goal. Continue home calcitriol.     LOS: [place] @TODAY @7 :02 AM

## 2020-06-21 NOTE — Progress Notes (Signed)
4 Days Post-Op Subjective: Patient reports less bloody appearing urine. Still having some spasms. Alum irrigation still running.Has dialysis today.  Objective: Vital signs in last 24 hours: Temp:  [97.8 F (36.6 C)-98.7 F (37.1 C)] 98.3 F (36.8 C) (03/16 0731) Pulse Rate:  [76-98] 82 (03/16 0731) Resp:  [13-20] 18 (03/16 0731) BP: (119-213)/(53-99) 133/54 (03/16 0731) SpO2:  [96 %-100 %] 99 % (03/16 0731)  Intake/Output from previous day: 03/15 0701 - 03/16 0700 In: 4165 [P.O.:450; Blood:315] Out: 52778 [Urine:15850] Intake/Output this shift: Total I/O In: 240 [P.O.:240] Out: 900 [Urine:900]  Physical Exam:  Constitutional: Vital signs reviewed. WD WN in NAD   Eyes: PERRL, No scleral icterus.   Cardiovascular: RRR Pulmonary/Chest: Normal effort Extremities: No cyanosis or edema   Bladder irrigated w/ 1 L NS. Very few clots irrigated but effluent still pink.  Lab Results: Recent Labs    06/19/20 1516 06/20/20 0118 06/20/20 1650  HGB 7.1* 6.7* 7.9*  HCT 22.0* 21.7* 24.7*   BMET Recent Labs    06/19/20 0104 06/20/20 0118  NA 139 138  K 4.6 4.9  CL 100 103  CO2 29 26  GLUCOSE 181* 126*  BUN 43* 54*  CREATININE 5.84* 6.61*  CALCIUM 8.2* 8.4*   Recent Labs    06/19/20 0104  INR 1.2   No results for input(s): LABURIN in the last 72 hours. Results for orders placed or performed during the hospital encounter of 06/16/20  SARS CORONAVIRUS 2 (TAT 6-24 HRS) Nasopharyngeal Nasopharyngeal Swab     Status: None   Collection Time: 06/16/20  7:12 PM   Specimen: Nasopharyngeal Swab  Result Value Ref Range Status   SARS Coronavirus 2 NEGATIVE NEGATIVE Final    Comment: (NOTE) SARS-CoV-2 target nucleic acids are NOT DETECTED.  The SARS-CoV-2 RNA is generally detectable in upper and lower respiratory specimens during the acute phase of infection. Negative results do not preclude SARS-CoV-2 infection, do not rule out co-infections with other pathogens, and should  not be used as the sole basis for treatment or other patient management decisions. Negative results must be combined with clinical observations, patient history, and epidemiological information. The expected result is Negative.  Fact Sheet for Patients: SugarRoll.be  Fact Sheet for Healthcare Providers: https://www.woods-mathews.com/  This test is not yet approved or cleared by the [place] FDA and  has been authorized for detection and/or diagnosis of SARS-CoV-2 by FDA under an Emergency Use Authorization (EUA). This EUA will remain  in effect (meaning this test can be used) for the duration of the COVID-19 declaration under Se ction 564(b)(1) of the Act, 21 U.S.C. section 360bbb-3(b)(1), unless the authorization is terminated or revoked sooner.  Performed at [facility], [place] [address], [place], [place] [address]      Assessment/Plan:   Radiation cystitis--still w/ some bloody urine despite operative mgmt and alum irrigation.   Will leave alum running for now--hopefully will continue to improve.   LOS: 5 days   Jorja Loa 06/21/2020, 7:46 AM

## 2020-06-22 DIAGNOSIS — N186 End stage renal disease: Secondary | ICD-10-CM | POA: Diagnosis not present

## 2020-06-22 DIAGNOSIS — I10 Essential (primary) hypertension: Secondary | ICD-10-CM | POA: Diagnosis not present

## 2020-06-22 DIAGNOSIS — E11621 Type 2 diabetes mellitus with foot ulcer: Secondary | ICD-10-CM | POA: Diagnosis not present

## 2020-06-22 DIAGNOSIS — R310 Gross hematuria: Secondary | ICD-10-CM | POA: Diagnosis not present

## 2020-06-22 LAB — CBC
HCT: 23.3 % — ABNORMAL LOW (ref 39.0–52.0)
Hemoglobin: 7.4 g/dL — ABNORMAL LOW (ref 13.0–17.0)
MCH: 28 pg (ref 26.0–34.0)
MCHC: 31.8 g/dL (ref 30.0–36.0)
MCV: 88.3 fL (ref 80.0–100.0)
Platelets: 144 10*3/uL — ABNORMAL LOW (ref 150–400)
RBC: 2.64 MIL/uL — ABNORMAL LOW (ref 4.22–5.81)
RDW: 17.1 % — ABNORMAL HIGH (ref 11.5–15.5)
WBC: 7.3 10*3/uL (ref 4.0–10.5)
nRBC: 0 % (ref 0.0–0.2)

## 2020-06-22 LAB — BASIC METABOLIC PANEL
Anion gap: 9 (ref 5–15)
BUN: 23 mg/dL (ref 8–23)
CO2: 29 mmol/L (ref 22–32)
Calcium: 8.8 mg/dL — ABNORMAL LOW (ref 8.9–10.3)
Chloride: 102 mmol/L (ref 98–111)
Creatinine, Ser: 3.51 mg/dL — ABNORMAL HIGH (ref 0.61–1.24)
GFR, Estimated: 18 mL/min — ABNORMAL LOW (ref >60)
Glucose, Bld: 184 mg/dL — ABNORMAL HIGH (ref 70–99)
Potassium: 4.7 mmol/L (ref 3.5–5.1)
Sodium: 140 mmol/L (ref 135–145)

## 2020-06-22 LAB — GLUCOSE, CAPILLARY
Glucose-Capillary: 147 mg/dL — ABNORMAL HIGH (ref 70–99)
Glucose-Capillary: 132 mg/dL — ABNORMAL HIGH (ref 70–99)
Glucose-Capillary: 163 mg/dL — ABNORMAL HIGH (ref 70–99)
Glucose-Capillary: 157 mg/dL — ABNORMAL HIGH (ref 70–99)

## 2020-06-22 LAB — MAGNESIUM: Magnesium: 2 mg/dL (ref 1.7–2.4)

## 2020-06-22 MED ORDER — CHLORHEXIDINE GLUCONATE CLOTH 2 % EX PADS
6.0000 | MEDICATED_PAD | Freq: Every day | CUTANEOUS | Status: DC
  Administered 2020-06-23 – 2020-07-01 (×5): 6 via TOPICAL

## 2020-06-22 MED ORDER — SODIUM CHLORIDE 0.9 % IR SOLN
Status: DC
  Filled 2020-06-22 (×5): qty 30

## 2020-06-22 MED ORDER — ENSURE ENLIVE PO LIQD
237.0000 mL | ORAL | Status: DC
  Administered 2020-06-24: 237 mL via ORAL

## 2020-06-22 NOTE — Progress Notes (Signed)
PT Cancellation Note  Patient Details Name: [name] MRN: [id number] DOB: [birth year]   Cancelled Treatment:    Reason Eval/Treat Not Completed: (P) Patient declined, no reason specified (pt just got lunch wanting to eat. Reports he has ambulated to bathroom with nursing assist.) Pt encouraged to continue pressure offloading while in bed Q2H. Will continue efforts next date as schedule permits per PT POC.  Kara Pacer [name] 06/22/2020, 2:52 PM

## 2020-06-22 NOTE — Progress Notes (Signed)
PROGRESS NOTE  [name] IRJ:[id number] DOB: 1950/06/25 DOA: 06/16/2020 PCP: Lorenda Hatchet, FNP   LOS: 6 days   Brief narrative:  [name] is a 70 y.o. male with medical history significant of prostate CA; hypothyroidism; hypertension, hyperlipidemia, diabetes mellitus and end-stage renal disease on hemodialysis onTuesday Thursday Saturday presented to hospital with hematuria for few days.  Patient had seen urology PA as outpatient but had gotten worse so was brought into the hospital. Urology was consulted and patient underwent Foley catheter placement and a three-way irrigation.  Patient was then admitted to hospital for further evaluation and treatment.  During hospitalization, patient continued to have large hematuria with clots, patient underwent a cystoscopic evaluation with fulguration on 06/17/2020.  Patient persisted to have blood clots after fulguration so was continued on CBI.  Urology followed the patient during hospitalization.  Assessment/Plan:  Principal Problem:   Gross hematuria Active Problems:   Hypercholesteremia   HTN (hypertension)   Hypothyroidism   Diabetes mellitus ([place])   ESRD (end stage renal disease) ([place])  Gross hematuria-passage of clots Likely secondary to radiation cystitis.  Urology on board and is currently on CBI. Patient underwent cystoscopy with fulguration on 06/17/2020.   Continue to follow urology recommendation.  ESRD on home HD Patient on chronic MWThS home HD.  Creatinine 3.5 at this time.  Nephrology on board for hemodialysis.    Acute on chronic anemia Likely exacerbated secondary to acute blood loss from hematuria.  Has baseline anemia of chronic disease and iron deficiency. He receives Mircera injections as outpatient.    Patient has received total of 3 units of packed RBC during this hospitalization.  Hemoglobin today at 7.4.  Prostate CA status post external beam radiation in 2018.  Hypothyroidism Continue Synthroid.  TSH  of 4.8  Essential hypertension Continue Coreg, Cozaar.   Blood pressure is stable at this time.  Hyperlipidemia Continue Crestor  Diabetes mellitus type 2. Hemoglobin A1c of 5.7.  On Ozempic at home.  Continue sliding-scale insulin Accu-Cheks diabetic diet.  Closely monitor blood glucose levels.  Latest POC glucose of 147.  DVT prophylaxis: SCDs Start: 06/16/20 1734   Code Status: Full code  Family Communication:  None today.  Status is: Inpatient  Remains inpatient appropriate because:IV treatments appropriate due to intensity of illness or inability to take PO, Inpatient level of care appropriate due to severity of illness and Urology evaluation, continued bladder irrigation   Dispo: The patient is from: Home              Anticipated d/c is to: Home when okay with urology.              Patient currently is not medically stable to d/c.   Difficult to place patient No  Consultants:  Urology  Procedures:  Continuous bladder irrigation  Cystoscopy with fulguration of the bladder on 06/17/2020  Anti-infectives:  Marland Kitchen Rocephin iv 3/11>  Anti-infectives (From admission, onward)   Start     Dose/Rate Route Frequency Ordered Stop   06/16/20 1900  cefTRIAXone (ROCEPHIN) 1 g in sodium chloride 0.9 % 100 mL IVPB  Status:  Discontinued        1 g 200 mL/hr over 30 Minutes Intravenous Every 24 hours 06/16/20 1855 06/20/20 0606     Subjective: Today, patient was seen and examined at bedside.  Patient feels little sleepy this morning.  Denies overt pain nausea vomiting.  Objective: Vitals:   06/22/20 0328 06/22/20 0756  BP: (!) 137/58 (!) 142/63  Pulse: 80 86  Resp: 15 16  Temp: 98.8 F (37.1 C) 98.3 F (36.8 C)  SpO2: 100% 97%    Intake/Output Summary (Last 24 hours) at 06/22/2020 1135 Last data filed at 06/22/2020 0800 Gross per 24 hour  Intake 8790 ml  Output 15450 ml  Net -6660 ml   Filed Weights   06/16/20 1641 06/21/20 1000 06/21/20 1351  Weight: 95.3 kg  88.5 kg 87.6 kg   Body mass index is 27.71 kg/m.   Physical Exam: General:  Average built, not in obvious distress HENT: Mild pallor noted, oral mucosa is moist.  Chest:  Clear breath sounds.  Diminished breath sounds bilaterally. No crackles or wheezes.  CVS: S1 &S2 heard. No murmur.  Regular rate and rhythm. Abdomen: Soft, nontender, nondistended.  Bowel sounds are heard.  Urinary catheter with pinkish urine Extremities: No cyanosis, clubbing or edema.  Left fifth toe amputation, right fourth and fifth toe amputation Psych: Alert, awake and oriented, normal mood CNS:  No cranial nerve deficits.  Power equal in all extremities.   Skin: Warm and dry.  No rashes noted.    Data Review: I have personally reviewed the following laboratory data and studies,  CBC: Recent Labs  Lab 06/17/20 0526 06/17/20 2318 06/18/20 0118 06/18/20 0619 06/19/20 0104 06/19/20 1516 06/20/20 0118 06/20/20 1650 06/21/20 0840 06/22/20 0119  WBC 6.6  --  8.4  --  5.7  --   --   --  6.5 7.3  HGB 7.5*   < > 6.6*   < > 6.3* 7.1* 6.7* 7.9* 7.2* 7.4*  HCT 24.7*   < > 21.9*   < > 19.4* 22.0* 21.7* 24.7* 22.1* 23.3*  MCV 87.0  --  88.7  --  87.4  --   --   --  88.0 88.3  PLT 125*  --  115*  --  123*  --   --   --  130* 144*   < > = values in this interval not displayed.   Basic Metabolic Panel: Recent Labs  Lab 06/18/20 0118 06/19/20 0104 06/20/20 0118 06/21/20 0840 06/22/20 0119  NA 137 139 138 139 140  K 4.2 4.6 4.9 5.0 4.7  CL 100 100 103 104 102  CO2 26 29 26 24 29   GLUCOSE 219* 181* 126* 201* 184*  BUN 27* 43* 54* 40* 23  CREATININE 3.87* 5.84* 6.61* 5.28* 3.51*  CALCIUM 8.5* 8.2* 8.4* 8.3* 8.8*  MG 1.9  --  2.0  --  2.0  PHOS  --   --  5.9* 4.6  --    Liver Function Tests: Recent Labs  Lab 06/21/20 0840  ALBUMIN 2.9*   No results for input(s): LIPASE, AMYLASE in the last 168 hours. No results for input(s): AMMONIA in the last 168 hours. Cardiac Enzymes: No results for input(s):  CKTOTAL, CKMB, CKMBINDEX, TROPONINI in the last 168 hours. BNP (last 3 results) No results for input(s): BNP in the last 8760 hours.  ProBNP (last 3 results) No results for input(s): PROBNP in the last 8760 hours.  CBG: Recent Labs  Lab 06/20/20 2102 06/21/20 0612 06/21/20 1615 06/21/20 2138 06/22/20 0609  GLUCAP 178* 153* 187* 173* 147*   Recent Results (from the past 240 hour(s))  Urine Culture     Status: None   Collection Time: 06/15/20 11:59 AM   Specimen: Urine, Random  Result Value Ref Range Status   Specimen Description URINE, RANDOM  Final   Special Requests NONE  Final  Culture   Final    NO GROWTH Performed at [facility], [place] [address], [place], [place] [address]    Report Status 06/16/2020 FINAL  Final  SARS CORONAVIRUS 2 (TAT 6-24 HRS) Nasopharyngeal Nasopharyngeal Swab     Status: None   Collection Time: 06/16/20  7:12 PM   Specimen: Nasopharyngeal Swab  Result Value Ref Range Status   SARS Coronavirus 2 NEGATIVE NEGATIVE Final    Comment: (NOTE) SARS-CoV-2 target nucleic acids are NOT DETECTED.  The SARS-CoV-2 RNA is generally detectable in upper and lower respiratory specimens during the acute phase of infection. Negative results do not preclude SARS-CoV-2 infection, do not rule out co-infections with other pathogens, and should not be used as the sole basis for treatment or other patient management decisions. Negative results must be combined with clinical observations, patient history, and epidemiological information. The expected result is Negative.  Fact Sheet for Patients: SugarRoll.be  Fact Sheet for Healthcare Providers: https://www.woods-mathews.com/  This test is not yet approved or cleared by the [place] FDA and  has been authorized for detection and/or diagnosis of SARS-CoV-2 by FDA under an Emergency Use Authorization (EUA). This EUA will remain  in effect (meaning this test can  be used) for the duration of the COVID-19 declaration under Se ction 564(b)(1) of the Act, 21 U.S.C. section 360bbb-3(b)(1), unless the authorization is terminated or revoked sooner.  Performed at [facility], [place] [address] [place],  [address]      Studies: No results found.    Flora Lipps, MD  [facility] 06/22/2020  If 7PM-7AM, please contact night-coverage

## 2020-06-22 NOTE — Progress Notes (Signed)
Pt has been very anxious about continuous bladder irrigation. Pt requested that the tubing be flushed and changed as he thought it was not dranning as its supposed to. educated pt that its flushing fine, and flushing too much and detaching connection often is infection risk. But pt wanted tube to be flushed. 2030: Cathter flushed with 50 cc Ns and pulled back without any difficulty, collection bag changed.  2240: pt requested cath flushing again, stated its not draining like its supposed to. Foley was flushed with 50 cc NS Again. Educated pt that it cannot be done very often unless tubing is clogged off. Pt keeps adjusting his foley around tip of the penis beside continuous teaching. Will continue to monitor.

## 2020-06-22 NOTE — Progress Notes (Signed)
[facility] ROUNDING NOTE   Subjective:   Brief history: End-stage renal disease on dialysis Monday Wednesday Friday.  History of prostate cancer status post radiation therapy.  Status post cystoscopy and clot removal 06/17/2020 for gross hematuria.     Blood pressure 142/61 pulse 82 temperature 97.8 O2 sats 100% room air  Sodium 140 potassium 4.7 chloride 102 CO2 29 BUN 23 creatinine 3.51 glucose 184 calcium 8.8 hemoglobin 7.4  Home medications allopurinol 200 mg daily, Calcitrol 0.5 mcg daily, Coreg 12.5 mg twice daily, levothyroxine 112 mcg daily, Cozaar 100 mg daily, Protonix 40 mg daily, Lyrica 150 mg twice daily, Crestor 10 mg daily, Pepcid 20 mg daily   Objective:  Vital signs in last 24 hours:  Temp:  [98 F (36.7 C)-98.8 F (37.1 C)] 98.8 F (37.1 C) (03/17 0328) Pulse Rate:  [75-92] 80 (03/17 0328) Resp:  [13-18] 15 (03/17 0328) BP: (106-163)/(53-79) 137/58 (03/17 0328) SpO2:  [96 %-100 %] 100 % (03/17 0328) Weight:  [87.6 kg-88.5 kg] 87.6 kg (03/16 1351)  Weight change:  Filed Weights   06/16/20 1641 06/21/20 1000 06/21/20 1351  Weight: 95.3 kg 88.5 kg 87.6 kg    Intake/Output: I/O last 3 completed shifts: In: 55732 [P.O.:690; KGURK:27062] Out: 23300 [Urine:22300; Other:1000]   Intake/Output this shift:  No intake/output data recorded.  General: Well appearing, nad  Heart: RRR no m,r,g Lungs: Clear bilaterally  Abdomen: soft, active bs Extremities: No sig LE edema  Dialysis Access: LUE AVF +bruit    Basic Metabolic Panel: Recent Labs  Lab 06/18/20 0118 06/19/20 0104 06/20/20 0118 06/21/20 0840 06/22/20 0119  NA 137 139 138 139 140  K 4.2 4.6 4.9 5.0 4.7  CL 100 100 103 104 102  CO2 26 29 26 24 29   GLUCOSE 219* 181* 126* 201* 184*  BUN 27* 43* 54* 40* 23  CREATININE 3.87* 5.84* 6.61* 5.28* 3.51*  CALCIUM 8.5* 8.2* 8.4* 8.3* 8.8*  MG 1.9  --  2.0  --  2.0  PHOS  --   --  5.9* 4.6  --     Liver Function Tests: Recent Labs   Lab 06/21/20 0840  ALBUMIN 2.9*   No results for input(s): LIPASE, AMYLASE in the last 168 hours. No results for input(s): AMMONIA in the last 168 hours.  CBC: Recent Labs  Lab 06/15/20 0711 06/16/20 1456 06/17/20 0526 06/17/20 2318 06/18/20 0118 06/18/20 0619 06/19/20 0104 06/19/20 1516 06/20/20 0118 06/20/20 1650 06/21/20 0840 06/22/20 0119  WBC 5.9   < > 6.6  --  8.4  --  5.7  --   --   --  6.5 7.3  NEUTROABS 4.8  --   --   --   --   --   --   --   --   --   --   --   HGB 9.8*   < > 7.5*   < > 6.6*   < > 6.3* 7.1* 6.7* 7.9* 7.2* 7.4*  HCT 32.2*   < > 24.7*   < > 21.9*   < > 19.4* 22.0* 21.7* 24.7* 22.1* 23.3*  MCV 88.0   < > 87.0  --  88.7  --  87.4  --   --   --  88.0 88.3  PLT 102*   < > 125*  --  115*  --  123*  --   --   --  130* 144*   < > = values in this interval not displayed.  Cardiac Enzymes: No results for input(s): CKTOTAL, CKMB, CKMBINDEX, TROPONINI in the last 168 hours.  BNP: Invalid input(s): POCBNP  CBG: Recent Labs  Lab 06/20/20 2102 06/21/20 0612 06/21/20 1615 06/21/20 2138 06/22/20 0609  GLUCAP 178* 153* 187* 173* 147*    Microbiology: Results for orders placed or performed during the hospital encounter of 06/16/20  SARS CORONAVIRUS 2 (TAT 6-24 HRS) Nasopharyngeal Nasopharyngeal Swab     Status: None   Collection Time: 06/16/20  7:12 PM   Specimen: Nasopharyngeal Swab  Result Value Ref Range Status   SARS Coronavirus 2 NEGATIVE NEGATIVE Final    Comment: (NOTE) SARS-CoV-2 target nucleic acids are NOT DETECTED.  The SARS-CoV-2 RNA is generally detectable in upper and lower respiratory specimens during the acute phase of infection. Negative results do not preclude SARS-CoV-2 infection, do not rule out co-infections with other pathogens, and should not be used as the sole basis for treatment or other patient management decisions. Negative results must be combined with clinical observations, patient history, and epidemiological  information. The expected result is Negative.  Fact Sheet for Patients: SugarRoll.be  Fact Sheet for Healthcare Providers: https://www.woods-mathews.com/  This test is not yet approved or cleared by the [place] FDA and  has been authorized for detection and/or diagnosis of SARS-CoV-2 by FDA under an Emergency Use Authorization (EUA). This EUA will remain  in effect (meaning this test can be used) for the duration of the COVID-19 declaration under Se ction 564(b)(1) of the Act, 21 U.S.C. section 360bbb-3(b)(1), unless the authorization is terminated or revoked sooner.  Performed at [facility], [place] [address], [place], [place] [address]     Coagulation Studies: No results for input(s): LABPROT, INR in the last 72 hours.  Urinalysis: No results for input(s): COLORURINE, LABSPEC, PHURINE, GLUCOSEU, HGBUR, BILIRUBINUR, KETONESUR, PROTEINUR, UROBILINOGEN, NITRITE, LEUKOCYTESUR in the last 72 hours.  Invalid input(s): APPERANCEUR    Imaging: No results found.   Medications:   . alum bladder irrigation 200 mL/hr at 06/22/20 0429  . sodium chloride irrigation 3,000 mL (06/21/20 2233)   . (feeding supplement) PROSource Plus  30 mL Oral BID BM  . allopurinol  200 mg Oral Daily  . opium-belladonna  1 suppository Rectal Q8H  . calcitRIOL  0.5 mcg Oral Daily  . carvedilol  12.5 mg Oral BID WC  . chlorhexidine  15 mL Mouth Rinse Daily  . Chlorhexidine Gluconate Cloth  6 each Topical Daily  . Chlorhexidine Gluconate Cloth  6 each Topical Q0600  . Chlorhexidine Gluconate Cloth  6 each Topical Q0600  . darbepoetin (ARANESP) injection - DIALYSIS  150 mcg Intravenous Q Wed-HD  . diazepam  5 mg Oral Q8H  . docusate sodium  100 mg Oral BID  . feeding supplement (NEPRO CARB STEADY)  237 mL Oral BID BM  . insulin aspart  0-6 Units Subcutaneous TID WC  . levothyroxine  112 mcg Oral QAC breakfast  . losartan  100 mg Oral Daily  .  multivitamin  1 tablet Oral QHS  . oxybutynin  5 mg Oral TID  . pantoprazole  40 mg Oral Daily  . pregabalin  150 mg Oral BID  . rosuvastatin  10 mg Oral Daily   acetaminophen **OR** acetaminophen, calcium carbonate (dosed in mg elemental calcium), camphor-menthol **AND** hydrOXYzine, docusate sodium, feeding supplement (NEPRO CARB STEADY), hydrALAZINE, morphine injection, ondansetron **OR** ondansetron (ZOFRAN) IV, oxyCODONE-acetaminophen, polyvinyl alcohol, sorbitol, zolpidem  Assessment/ Plan:   Dialysis Orders: HomeHD NxStage MTWF EDW 85kg  Assessment/Plan: 1. Gross hematuria - Hx prostate ca s/p radiation therapy. Urology following -s/p cystoscopy/clot removal in OR 3/12.  2. ESRD - Home HD. Missed last treatment. Had HD off schedule 3/12. Continue MWF schedule while here.  Next dialysis treatment will be 06/23/2020 3. Hypertension/volume - BP sl elevated. Continue home meds.--Coreg, Losartan, hydralazine.  UF as tolerated.  4. Anemia -continue to follow trend.  ESA as outpatient.  Will restart ESA check iron studies.  Transfuse as needed 5. Metabolic bone disease - OP Ca/Phos at goal. Continue home calcitriol.     LOS: [place] @TODAY @7 :08 AM

## 2020-06-22 NOTE — Progress Notes (Signed)
OT Cancellation Note  Patient Details Name: Raydan Schlabach MRN: [id number] DOB: 1951/03/09   Cancelled Treatment:    Reason Eval/Treat Not Completed: Patient declined, due to kidney flush and discomfort.  OT to continue efforts as appropriate.  Richard D Popella 06/22/2020, 2:46 PM

## 2020-06-22 NOTE — Progress Notes (Signed)
Nutrition Follow Up  DOCUMENTATION CODES:   Not applicable  INTERVENTION:    Ensure Enlive po daily each supplement provides 350 kcal and 20 grams of protein  Magic cup BID with meals, each supplement provides 290 kcal and 9 grams of protein  Renal MVI daily   NUTRITION DIAGNOSIS:   Increased nutrient needs related to chronic illness (ESRD on HD) as evidenced by estimated needs.  Ongoing  GOAL:   Patient will meet greater than or equal to 90% of their needs   Progressing   MONITOR:   PO intake,Supplement acceptance,Labs,Weight trends,I & O's  REASON FOR ASSESSMENT:   Consult Other (Comment) ("nutritional goals")  ASSESSMENT:   Pt admitted with gross hematuria, likely 2/2 radiation cystitis. PMH includes prostate cancer, hypothyroidism, HTN, HLD, DM, and ESRD on HD.  3/12 s/p cystoscopy/clot removal   No plans for return to OR.   Intake progressing. Last three meals documented as 100%, 100%, and 75%. Refusing ProSource and Nepro. RD to transition to magic cup and Ensure.   Admission weight: 88.5 kg Current weight: 87.6 kg  Medications: calcitriol, aranesp, colace, SS novolog Labs: CBG 132-187  Diet Order:   Diet Order            Diet regular Room service appropriate? Yes; Fluid consistency: Thin  Diet effective now                 EDUCATION NEEDS:   No education needs have been identified at this time  Skin:  Skin Assessment: Skin Integrity Issues: Skin Integrity Issues:: Incisions Incisions: perineum  Last BM:  3/15  Height:   Ht Readings from Last 1 Encounters:  06/16/20 5\' 10"  (1.778 m)    Weight:   Wt Readings from Last 1 Encounters:  06/21/20 87.6 kg    BMI:  Body mass index is 27.71 kg/m.  Estimated Nutritional Needs:   Kcal:  3358-2518  Protein:  115-130 grams  Fluid:  1L+UOP  Mariana Single RD, LDN Clinical Nutrition Pager listed in [place]

## 2020-06-22 NOTE — H&P (View-Only) (Signed)
5 Days Post-Op Subjective: AFHDS. Hgb stable. RN reported flushing foley a few times overnight with removal of minimal small clots. Urine light pink on slow drip CBI this morning. Flushed without removal of clot and restarted slow drip CBI with light pink returns.   Objective: Vital signs in last 24 hours: Temp:  [98 F (36.7 C)-98.8 F (37.1 C)] 98.8 F (37.1 C) (03/17 0328) Pulse Rate:  [75-92] 80 (03/17 0328) Resp:  [13-18] 15 (03/17 0328) BP: (106-163)/(53-79) 137/58 (03/17 0328) SpO2:  [96 %-100 %] 100 % (03/17 0328) Weight:  [87.6 kg-88.5 kg] 87.6 kg (03/16 1351)  Intake/Output from previous day: 03/16 0701 - 03/17 0700 In: 8790 [P.O.:240] Out: 17700 [Urine:16700] Intake/Output this shift: Total I/O In: 8550 [Other:8550] Out: 8850 [Urine:8850]  Physical Exam:  General: Alert and oriented CV: Regular rate Lungs: NWOB on RA GU: 24 Fr 3-way hematuria catheter in place draining light pink urine on slow drip CBI + Alum Ext: Warm and well-perfused   Lab Results: Recent Labs    06/20/20 1650 06/21/20 0840 06/22/20 0119  HGB 7.9* 7.2* 7.4*  HCT 24.7* 22.1* 23.3*   BMET Recent Labs    06/21/20 0840 06/22/20 0119  NA 139 140  K 5.0 4.7  CL 104 102  CO2 24 29  GLUCOSE 201* 184*  BUN 40* 23  CREATININE 5.28* 3.51*  CALCIUM 8.3* 8.8*     Studies/Results: No results found.  Assessment/Plan: 1. Radiation cystitis 2. Clot retention 3. Gross hematuria  POD5 s/p cystourethroscopy and clot evacuation with fulguration as well as bilateral retrograde pyelograms to complete hematuria workup (negative for upper tract abnormalities) on 3/12. Patient has had persistent bleeding requiring tranfusion support (3u pRBC total during hospitalization, last unit 3/15) and continued CBI. Started Alum intravesical irrigation on 3/14. Hematuria now appears to be improving, and hematuria has stabilized. We are hopeful that we can wean him off CBI + Alum and remove foley/TOV if his  hematuria remains mild off CBI + Alum over the next 1-2 days.   - Ok for diet. No indication for urologic intervention today given improvement in hematuria - Titrate CBI + Alum to achieve pink urine. If urine remains light pink on slow drip CBI, then can stop CBI + Alum. Restart if urine darks to dark pink/cherry. If foley catheter stops draining, please disconnect tubing and manually irrigate foley catheter. If unable to irrigate foley catheter, please page urology - Continue [facility] B&O suppositories, valium suppositories, and PO oxybutynin 5mg  TID for significant bladder spasms - Continue to trend Hgb. Transfuse as needed - Continue to hold home ASA 81mg  given active hematuria - We will continue to closely follow. We greatly appreciate Family Medicine's help with this medically complicated patient    I saw [name] this AM. Agree that we can hold off on return to OR. I will leave alum on slow drip a while longer. Will shoot for TOV tomorrow if continued improvement.   LOS: 6 days

## 2020-06-22 NOTE — Progress Notes (Signed)
Pt complained of pain in his penis 8/10, said its uncomfortable. PRN morphine administered. Tip of penis was draining small amount of serosanguineous drainage. Pt has been instructed several times that he should not be adjusting or tugging the tubing. Pt stated it gives him some relief. Call bell within reach. CBI running well, urine is clearing off, will continue to monitor.

## 2020-06-22 NOTE — Progress Notes (Addendum)
5 Days Post-Op Subjective: AFHDS. Hgb stable. RN reported flushing foley a few times overnight with removal of minimal small clots. Urine light pink on slow drip CBI this morning. Flushed without removal of clot and restarted slow drip CBI with light pink returns.   Objective: Vital signs in last 24 hours: Temp:  [98 F (36.7 C)-98.8 F (37.1 C)] 98.8 F (37.1 C) (03/17 0328) Pulse Rate:  [75-92] 80 (03/17 0328) Resp:  [13-18] 15 (03/17 0328) BP: (106-163)/(53-79) 137/58 (03/17 0328) SpO2:  [96 %-100 %] 100 % (03/17 0328) Weight:  [87.6 kg-88.5 kg] 87.6 kg (03/16 1351)  Intake/Output from previous day: 03/16 0701 - 03/17 0700 In: 8790 [P.O.:240] Out: 17700 [Urine:16700] Intake/Output this shift: Total I/O In: 8550 [Other:8550] Out: 8850 [Urine:8850]  Physical Exam:  General: Alert and oriented CV: Regular rate Lungs: NWOB on RA GU: 24 Fr 3-way hematuria catheter in place draining light pink urine on slow drip CBI + Alum Ext: Warm and well-perfused   Lab Results: Recent Labs    06/20/20 1650 06/21/20 0840 06/22/20 0119  HGB 7.9* 7.2* 7.4*  HCT 24.7* 22.1* 23.3*   BMET Recent Labs    06/21/20 0840 06/22/20 0119  NA 139 140  K 5.0 4.7  CL 104 102  CO2 24 29  GLUCOSE 201* 184*  BUN 40* 23  CREATININE 5.28* 3.51*  CALCIUM 8.3* 8.8*     Studies/Results: No results found.  Assessment/Plan: 1. Radiation cystitis 2. Clot retention 3. Gross hematuria  POD5 s/p cystourethroscopy and clot evacuation with fulguration as well as bilateral retrograde pyelograms to complete hematuria workup (negative for upper tract abnormalities) on 3/12. Patient has had persistent bleeding requiring tranfusion support (3u pRBC total during hospitalization, last unit 3/15) and continued CBI. Started Alum intravesical irrigation on 3/14. Hematuria now appears to be improving, and hematuria has stabilized. We are hopeful that we can wean him off CBI + Alum and remove foley/TOV if his  hematuria remains mild off CBI + Alum over the next 1-2 days.   - Ok for diet. No indication for urologic intervention today given improvement in hematuria - Titrate CBI + Alum to achieve pink urine. If urine remains light pink on slow drip CBI, then can stop CBI + Alum. Restart if urine darks to dark pink/cherry. If foley catheter stops draining, please disconnect tubing and manually irrigate foley catheter. If unable to irrigate foley catheter, please page urology - Continue [facility] B&O suppositories, valium suppositories, and PO oxybutynin 5mg  TID for significant bladder spasms - Continue to trend Hgb. Transfuse as needed - Continue to hold home ASA 81mg  given active hematuria - We will continue to closely follow. We greatly appreciate Family Medicine's help with this medically complicated patient    I saw Mr Talamantez this AM. Agree that we can hold off on return to OR. I will leave alum on slow drip a while longer. Will shoot for TOV tomorrow if continued improvement.   LOS: 6 days

## 2020-06-22 NOTE — Plan of Care (Signed)

## 2020-06-23 DIAGNOSIS — E11621 Type 2 diabetes mellitus with foot ulcer: Secondary | ICD-10-CM | POA: Diagnosis not present

## 2020-06-23 DIAGNOSIS — R310 Gross hematuria: Secondary | ICD-10-CM | POA: Diagnosis not present

## 2020-06-23 DIAGNOSIS — N186 End stage renal disease: Secondary | ICD-10-CM | POA: Diagnosis not present

## 2020-06-23 DIAGNOSIS — I10 Essential (primary) hypertension: Secondary | ICD-10-CM | POA: Diagnosis not present

## 2020-06-23 LAB — CBC
HCT: 22.2 % — ABNORMAL LOW (ref 39.0–52.0)
Hemoglobin: 7.1 g/dL — ABNORMAL LOW (ref 13.0–17.0)
MCH: 28.4 pg (ref 26.0–34.0)
MCHC: 32 g/dL (ref 30.0–36.0)
MCV: 88.8 fL (ref 80.0–100.0)
Platelets: 142 10*3/uL — ABNORMAL LOW (ref 150–400)
RBC: 2.5 MIL/uL — ABNORMAL LOW (ref 4.22–5.81)
RDW: 17.2 % — ABNORMAL HIGH (ref 11.5–15.5)
WBC: 8 10*3/uL (ref 4.0–10.5)
nRBC: 0.3 % — ABNORMAL HIGH (ref 0.0–0.2)

## 2020-06-23 LAB — BASIC METABOLIC PANEL
Anion gap: 11 (ref 5–15)
BUN: 43 mg/dL — ABNORMAL HIGH (ref 8–23)
CO2: 25 mmol/L (ref 22–32)
Calcium: 8.9 mg/dL (ref 8.9–10.3)
Chloride: 103 mmol/L (ref 98–111)
Creatinine, Ser: 4.83 mg/dL — ABNORMAL HIGH (ref 0.61–1.24)
GFR, Estimated: 12 mL/min — ABNORMAL LOW (ref >60)
Glucose, Bld: 144 mg/dL — ABNORMAL HIGH (ref 70–99)
Potassium: 6.6 mmol/L (ref 3.5–5.1)
Sodium: 139 mmol/L (ref 135–145)

## 2020-06-23 LAB — GLUCOSE, CAPILLARY
Glucose-Capillary: 135 mg/dL — ABNORMAL HIGH (ref 70–99)
Glucose-Capillary: 148 mg/dL — ABNORMAL HIGH (ref 70–99)
Glucose-Capillary: 152 mg/dL — ABNORMAL HIGH (ref 70–99)
Glucose-Capillary: 198 mg/dL — ABNORMAL HIGH (ref 70–99)

## 2020-06-23 LAB — PHOSPHORUS: Phosphorus: 5.6 mg/dL — ABNORMAL HIGH (ref 2.5–4.6)

## 2020-06-23 LAB — POTASSIUM: Potassium: 5 mmol/L (ref 3.5–5.1)

## 2020-06-23 LAB — MAGNESIUM: Magnesium: 2 mg/dL (ref 1.7–2.4)

## 2020-06-23 NOTE — Progress Notes (Signed)
[facility] Progress Note   Assessment/ Plan:   Dialysis Orders: HomeHD NxStage MTWF EDW 85kg   Assessment/Plan: 1. Gross hematuria - Hx prostate ca s/p radiation therapy. Urology following -s/pcystoscopy/clot removalin OR3/12.Foley removed today 2. ESRD - Home HD. Missed last treatment. Had HD off schedule 3/12. Continue MWF schedule while here.  HD today 06/23/2020 3. Hypertension/volume - BPsl elevated. Continue home meds.--Coreg, Losartan, hydralazine.UF as tolerated. 4. Anemia -continue to follow trend.  ESA as outpatient.  Will restart ESA check iron studies.  Transfuse as needed 5. Metabolic bone disease - OPCa/Phos at goal. Continue home calcitriol.   Subjective:    Sleepy today.  No complaints.   Objective:   BP (!) 120/57 (BP Location: Right Arm)   Pulse 78   Temp 98.2 F (36.8 C) (Oral)   Resp 18   Ht 5\' 10"  (1.778 m)   Wt 87.6 kg   SpO2 94%   BMI 27.71 kg/m   Physical Exam: Gen: NAD, sleeping CVS: RRR Resp: clear Abd: soft Ext: 1+ LE edema ACCESS: L AVF  Labs: BMET Recent Labs  Lab 06/17/20 0526 06/18/20 0118 06/19/20 0104 06/20/20 0118 06/21/20 0840 06/22/20 0119 06/23/20 0159 06/23/20 0526  NA 140 137 139 138 139 140 139  --   K 4.6 4.2 4.6 4.9 5.0 4.7 6.6* 5.0  CL 103 100 100 103 104 102 103  --   CO2 25 26 29 26 24 29 25   --   GLUCOSE 119* 219* 181* 126* 201* 184* 144*  --   BUN 53* 27* 43* 54* 40* 23 43*  --   CREATININE 5.78* 3.87* 5.84* 6.61* 5.28* 3.51* 4.83*  --   CALCIUM 9.1 8.5* 8.2* 8.4* 8.3* 8.8* 8.9  --   PHOS  --   --   --  5.9* 4.6  --  5.6*  --    CBC Recent Labs  Lab 06/19/20 0104 06/19/20 1516 06/20/20 1650 06/21/20 0840 06/22/20 0119 06/23/20 0159  WBC 5.7  --   --  6.5 7.3 8.0  HGB 6.3*   < > 7.9* 7.2* 7.4* 7.1*  HCT 19.4*   < > 24.7* 22.1* 23.3* 22.2*  MCV 87.4  --   --  88.0 88.3 88.8  PLT 123*  --   --  130* 144* 142*   < > = values in this interval not displayed.       Medications:    . allopurinol  200 mg Oral Daily  . opium-belladonna  1 suppository Rectal Q8H  . calcitRIOL  0.5 mcg Oral Daily  . carvedilol  12.5 mg Oral BID WC  . chlorhexidine  15 mL Mouth Rinse Daily  . Chlorhexidine Gluconate Cloth  6 each Topical Daily  . Chlorhexidine Gluconate Cloth  6 each Topical Q0600  . Chlorhexidine Gluconate Cloth  6 each Topical Q0600  . Chlorhexidine Gluconate Cloth  6 each Topical Q0600  . darbepoetin (ARANESP) injection - DIALYSIS  150 mcg Intravenous Q Wed-HD  . diazepam  5 mg Oral Q8H  . docusate sodium  100 mg Oral BID  . feeding supplement  237 mL Oral Q24H  . insulin aspart  0-6 Units Subcutaneous TID WC  . levothyroxine  112 mcg Oral QAC breakfast  . losartan  100 mg Oral Daily  . multivitamin  1 tablet Oral QHS  . oxybutynin  5 mg Oral TID  . pantoprazole  40 mg Oral Daily  . pregabalin  150 mg Oral BID  .  rosuvastatin  10 mg Oral Daily     Madelon Lips, MD 06/23/2020, 12:16 PM

## 2020-06-23 NOTE — Progress Notes (Signed)
PT Cancellation Note  Patient Details Name: [name] MRN: [id number] DOB: Sep 08, 1950   Cancelled Treatment:    Reason Eval/Treat Not Completed: Patient declined, no reason specified.  No, I need to sleep... 06/23/2020  Jon Carne., PT Acute Rehabilitation Services [phone]  (pager) [phone]  (office)   [name] 06/23/2020, 1:46 PM

## 2020-06-23 NOTE — Progress Notes (Signed)
Pt potasium came back critical 6.6 paged Dr. Ronnald Ramp who ordered stat potasium to be drawn

## 2020-06-23 NOTE — Progress Notes (Signed)
Foley catheter removed per Urology order.  250cc bloody urine emptied from bag. Pain meds given per PRN order.  Will cont plan of care

## 2020-06-23 NOTE — Progress Notes (Signed)
6 Days Post-Op Subjective: Patient reports no bladder issues. He perseverates QJ:JHER mgmt. Minimal clots irrigated last 24 hrs  Objective: Vital signs in last 24 hours: Temp:  [98.2 F (36.8 C)-98.8 F (37.1 C)] 98.2 F (36.8 C) (03/18 0259) Pulse Rate:  [82-88] 88 (03/18 0259) Resp:  [14-18] 17 (03/18 0259) BP: (128-153)/(52-66) 134/55 (03/18 0259) SpO2:  [95 %-100 %] 95 % (03/18 0259)  Intake/Output from previous day: 03/17 0701 - 03/18 0700 In: 3240 [P.O.:240] Out: 10900 [Urine:10900] Intake/Output this shift: Total I/O In: 3000 [Other:3000] Out: 8950 [Urine:8950]  Physical Exam:  Constitutional: Vital signs reviewed. WD WN in NAD   Eyes: PERRL, No scleral icterus.   Cardiovascular: RRR Pulmonary/Chest: Normal effort  Urine brownish. When CBI used to rinse no clots out.  Lab Results: Recent Labs    06/21/20 0840 06/22/20 0119 06/23/20 0159  HGB 7.2* 7.4* 7.1*  HCT 22.1* 23.3* 22.2*   BMET Recent Labs    06/22/20 0119 06/23/20 0159 06/23/20 0526  NA 140 139  --   K 4.7 6.6* 5.0  CL 102 103  --   CO2 29 25  --   GLUCOSE 184* 144*  --   BUN 23 43*  --   CREATININE 3.51* 4.83*  --   CALCIUM 8.8* 8.9  --    No results for input(s): LABPT, INR in the last 72 hours. No results for input(s): LABURIN in the last 72 hours. Results for orders placed or performed during the hospital encounter of 06/16/20  SARS CORONAVIRUS 2 (TAT 6-24 HRS) Nasopharyngeal Nasopharyngeal Swab     Status: None   Collection Time: 06/16/20  7:12 PM   Specimen: Nasopharyngeal Swab  Result Value Ref Range Status   SARS Coronavirus 2 NEGATIVE NEGATIVE Final    Comment: (NOTE) SARS-CoV-2 target nucleic acids are NOT DETECTED.  The SARS-CoV-2 RNA is generally detectable in upper and lower respiratory specimens during the acute phase of infection. Negative results do not preclude SARS-CoV-2 infection, do not rule out co-infections with other pathogens, and should not be used as  the sole basis for treatment or other patient management decisions. Negative results must be combined with clinical observations, patient history, and epidemiological information. The expected result is Negative.  Fact Sheet for Patients: SugarRoll.be  Fact Sheet for Healthcare Providers: https://www.woods-mathews.com/  This test is not yet approved or cleared by the [place] FDA and  has been authorized for detection and/or diagnosis of SARS-CoV-2 by FDA under an Emergency Use Authorization (EUA). This EUA will remain  in effect (meaning this test can be used) for the duration of the COVID-19 declaration under Se ction 564(b)(1) of the Act, 21 U.S.C. section 360bbb-3(b)(1), unless the authorization is terminated or revoked sooner.  Performed at [facility], [place] [address] [place], [place] [address]     Studies/Results: No results found.  Assessment/Plan:   Radiation cystitis--improved  Will d/c foley for voiding trial, hopefully will do well w/o return of bleeding. We will follow over weekend   LOS: 7 days   Jorja Loa 06/23/2020, 6:56 AM

## 2020-06-23 NOTE — Progress Notes (Signed)
PROGRESS NOTE  [name] JOI:[id number] DOB: September 30, 1950 DOA: 06/16/2020 PCP: Lorenda Hatchet, FNP   LOS: 7 days   Brief narrative:  [name] is a 70 y.o. male with medical history significant of prostate CA; hypothyroidism; hypertension, hyperlipidemia, diabetes mellitus and end-stage renal disease on hemodialysis on Tuesday, Thursday, Saturday presented to the hospital with hematuria for few days.  Patient had seen urology PA as outpatient but hematuria had gotten worse so was brought into the hospital. Urology was consulted and patient underwent Foley catheter placement and a three-way irrigation.    During hospitalization, patient continued to have large hematuria with clots, patient underwent a cystoscopic evaluation with fulguration on 06/17/2020.  Patient persisted to have blood clots after fulguration so was continued on CBI.  Urology followed the patient during hospitalization.  Currently improving.  Assessment/Plan:  Principal Problem:   Gross hematuria Active Problems:   Hypercholesteremia   HTN (hypertension)   Hypothyroidism   Diabetes mellitus ([place])   ESRD (end stage renal disease) ([place])  Gross hematuria-passage of clots Likely secondary to radiation cystitis.  Urology on board and is currently on CBI. Patient underwent cystoscopy with fulguration on 06/17/2020.   Continue to follow urology recommendation.  Likely discontinuation of Foley catheter today as per urology.  ESRD on home HD Patient on chronic MWThS home HD.   Nephrology on board for hemodialysis.    Acute on chronic anemia Likely exacerbated secondary to acute blood loss from hematuria.  Has baseline anemia of chronic disease and iron deficiency. He receives Mircera injections as outpatient.    Patient received total of 3 units of packed RBC during this hospitalization.  Hemoglobin today at 7.1.  Prostate CA status post external beam radiation in 2018.  Hypothyroidism Continue Synthroid.  TSH of  4.8  Essential hypertension Continue Coreg, Cozaar.   Blood pressure is stable at this time.  Hyperlipidemia Continue Crestor  Diabetes mellitus type 2. Hemoglobin A1c of 5.7.  On Ozempic at home.  Continue sliding-scale insulin, Accu-Cheks diabetic diet.  Latest POC glucose of 135  DVT prophylaxis: SCDs Start: 06/16/20 1734   Code Status: Full code  Family Communication:  None today.  Status is: Inpatient  Remains inpatient appropriate because:IV treatments appropriate due to intensity of illness or inability to take PO, Inpatient level of care appropriate due to severity of illness, bladder irrigation.   Dispo: The patient is from: Home              Anticipated d/c is to: Home when okay with urology.  Likely after the weekend.              Patient currently is not medically stable to d/c.   Difficult to place patient No  Consultants:  Urology  Procedures:  Continuous bladder irrigation  Cystoscopy with fulguration of the bladder on 06/17/2020  Anti-infectives:    Rocephin iv 3/11>3/15  Anti-infectives (From admission, onward)   Start     Dose/Rate Route Frequency Ordered Stop   06/16/20 1900  cefTRIAXone (ROCEPHIN) 1 g in sodium chloride 0.9 % 100 mL IVPB  Status:  Discontinued        1 g 200 mL/hr over 30 Minutes Intravenous Every 24 hours 06/16/20 1855 06/20/20 0606     Subjective: Today, patient was seen and examined at bedside.  Patient states that his hematuria is improving.  Denies overt pain nausea vomiting fever chills  Objective: Vitals:   06/23/20 0259 06/23/20 0744  BP: (!) 134/55 (!) 127/49  Pulse: 88 80  Resp: 17 18  Temp: 98.2 F (36.8 C) 98.5 F (36.9 C)  SpO2: 95% 95%    Intake/Output Summary (Last 24 hours) at 06/23/2020 1001 Last data filed at 06/23/2020 0754 Gross per 24 hour  Intake 3000 ml  Output 10950 ml  Net -7950 ml   Filed Weights   06/16/20 1641 06/21/20 1000 06/21/20 1351  Weight: 95.3 kg 88.5 kg 87.6 kg   Body  mass index is 27.71 kg/m.   Physical Exam: General:  Average built, not in obvious distress HENT: Mild pallor noted. Oral mucosa is moist.  Chest:  Clear breath sounds.  Diminished breath sounds bilaterally. No crackles or wheezes.  CVS: S1 &S2 heard. No murmur.  Regular rate and rhythm. Abdomen: Soft, nontender, nondistended.  Bowel sounds are heard.  Urinary catheter with mostly clear urine Extremities: No cyanosis, clubbing or edema.  Peripheral pulses are palpable. Psych: Alert, awake and oriented, normal mood CNS:  No cranial nerve deficits.  Power equal in all extremities.   Skin: Warm and dry.  No rashes noted.   Data Review: I have personally reviewed the following laboratory data and studies,  CBC: Recent Labs  Lab 06/18/20 0118 06/18/20 0619 06/19/20 0104 06/19/20 1516 06/20/20 0118 06/20/20 1650 06/21/20 0840 06/22/20 0119 06/23/20 0159  WBC 8.4  --  5.7  --   --   --  6.5 7.3 8.0  HGB 6.6*   < > 6.3*   < > 6.7* 7.9* 7.2* 7.4* 7.1*  HCT 21.9*   < > 19.4*   < > 21.7* 24.7* 22.1* 23.3* 22.2*  MCV 88.7  --  87.4  --   --   --  88.0 88.3 88.8  PLT 115*  --  123*  --   --   --  130* 144* 142*   < > = values in this interval not displayed.   Basic Metabolic Panel: Recent Labs  Lab 06/18/20 0118 06/19/20 0104 06/20/20 0118 06/21/20 0840 06/22/20 0119 06/23/20 0159 06/23/20 0526  NA 137 139 138 139 140 139  --   K 4.2 4.6 4.9 5.0 4.7 6.6* 5.0  CL 100 100 103 104 102 103  --   CO2 26 29 26 24 29 25   --   GLUCOSE 219* 181* 126* 201* 184* 144*  --   BUN 27* 43* 54* 40* 23 43*  --   CREATININE 3.87* 5.84* 6.61* 5.28* 3.51* 4.83*  --   CALCIUM 8.5* 8.2* 8.4* 8.3* 8.8* 8.9  --   MG 1.9  --  2.0  --  2.0 2.0  --   PHOS  --   --  5.9* 4.6  --  5.6*  --    Liver Function Tests: Recent Labs  Lab 06/21/20 0840  ALBUMIN 2.9*   No results for input(s): LIPASE, AMYLASE in the last 168 hours. No results for input(s): AMMONIA in the last 168 hours. Cardiac  Enzymes: No results for input(s): CKTOTAL, CKMB, CKMBINDEX, TROPONINI in the last 168 hours. BNP (last 3 results) No results for input(s): BNP in the last 8760 hours.  ProBNP (last 3 results) No results for input(s): PROBNP in the last 8760 hours.  CBG: Recent Labs  Lab 06/22/20 0609 06/22/20 1227 06/22/20 1717 06/22/20 2124 06/23/20 0615  GLUCAP 147* 132* 163* 157* 135*   Recent Results (from the past 240 hour(s))  Urine Culture     Status: None   Collection Time: 06/15/20 11:59 AM   Specimen:  Urine, Random  Result Value Ref Range Status   Specimen Description URINE, RANDOM  Final   Special Requests NONE  Final   Culture   Final    NO GROWTH Performed at [facility], [address] [place], [place] [address]    Report Status 06/16/2020 FINAL  Final  SARS CORONAVIRUS 2 (TAT 6-24 HRS) Nasopharyngeal Nasopharyngeal Swab     Status: None   Collection Time: 06/16/20  7:12 PM   Specimen: Nasopharyngeal Swab  Result Value Ref Range Status   SARS Coronavirus 2 NEGATIVE NEGATIVE Final    Comment: (NOTE) SARS-CoV-2 target nucleic acids are NOT DETECTED.  The SARS-CoV-2 RNA is generally detectable in upper and lower respiratory specimens during the acute phase of infection. Negative results do not preclude SARS-CoV-2 infection, do not rule out co-infections with other pathogens, and should not be used as the sole basis for treatment or other patient management decisions. Negative results must be combined with clinical observations, patient history, and epidemiological information. The expected result is Negative.  Fact Sheet for Patients: SugarRoll.be  Fact Sheet for Healthcare Providers: https://www.woods-mathews.com/  This test is not yet approved or cleared by the [place] FDA and  has been authorized for detection and/or diagnosis of SARS-CoV-2 by FDA under an Emergency Use Authorization (EUA). This EUA will remain   in effect (meaning this test can be used) for the duration of the COVID-19 declaration under Se ction 564(b)(1) of the Act, 21 U.S.C. section 360bbb-3(b)(1), unless the authorization is terminated or revoked sooner.  Performed at [facility], [place] [address], [place], [place] [address]      Studies: No results found.    Flora Lipps, MD  [facility] 06/23/2020  If 7PM-7AM, please contact night-coverage

## 2020-06-23 NOTE — Progress Notes (Signed)
Pt continues to request foley irrigate every q 2 to 3 hours and complain of pain, color charges from cherry to light pink and then back again, pain med's given multiple times this shift

## 2020-06-24 DIAGNOSIS — E039 Hypothyroidism, unspecified: Secondary | ICD-10-CM

## 2020-06-24 DIAGNOSIS — E7800 Pure hypercholesterolemia, unspecified: Secondary | ICD-10-CM

## 2020-06-24 DIAGNOSIS — R310 Gross hematuria: Secondary | ICD-10-CM

## 2020-06-24 DIAGNOSIS — Z794 Long term (current) use of insulin: Secondary | ICD-10-CM

## 2020-06-24 DIAGNOSIS — I10 Essential (primary) hypertension: Secondary | ICD-10-CM | POA: Diagnosis not present

## 2020-06-24 DIAGNOSIS — N186 End stage renal disease: Secondary | ICD-10-CM | POA: Diagnosis not present

## 2020-06-24 DIAGNOSIS — E11621 Type 2 diabetes mellitus with foot ulcer: Secondary | ICD-10-CM | POA: Diagnosis not present

## 2020-06-24 DIAGNOSIS — L97509 Non-pressure chronic ulcer of other part of unspecified foot with unspecified severity: Secondary | ICD-10-CM

## 2020-06-24 LAB — CBC
HCT: 23.3 % — ABNORMAL LOW (ref 39.0–52.0)
Hemoglobin: 7.2 g/dL — ABNORMAL LOW (ref 13.0–17.0)
MCH: 27.5 pg (ref 26.0–34.0)
MCHC: 30.9 g/dL (ref 30.0–36.0)
MCV: 88.9 fL (ref 80.0–100.0)
Platelets: 150 10*3/uL (ref 150–400)
RBC: 2.62 MIL/uL — ABNORMAL LOW (ref 4.22–5.81)
RDW: 17.1 % — ABNORMAL HIGH (ref 11.5–15.5)
WBC: 8.9 10*3/uL (ref 4.0–10.5)
nRBC: 0.3 % — ABNORMAL HIGH (ref 0.0–0.2)

## 2020-06-24 LAB — BASIC METABOLIC PANEL
Anion gap: 9 (ref 5–15)
BUN: 24 mg/dL — ABNORMAL HIGH (ref 8–23)
CO2: 28 mmol/L (ref 22–32)
Calcium: 8.9 mg/dL (ref 8.9–10.3)
Chloride: 98 mmol/L (ref 98–111)
Creatinine, Ser: 3.2 mg/dL — ABNORMAL HIGH (ref 0.61–1.24)
GFR, Estimated: 20 mL/min — ABNORMAL LOW (ref >60)
Glucose, Bld: 170 mg/dL — ABNORMAL HIGH (ref 70–99)
Potassium: 4.7 mmol/L (ref 3.5–5.1)
Sodium: 135 mmol/L (ref 135–145)

## 2020-06-24 LAB — MAGNESIUM: Magnesium: 1.9 mg/dL (ref 1.7–2.4)

## 2020-06-24 LAB — GLUCOSE, CAPILLARY
Glucose-Capillary: 153 mg/dL — ABNORMAL HIGH (ref 70–99)
Glucose-Capillary: 202 mg/dL — ABNORMAL HIGH (ref 70–99)
Glucose-Capillary: 154 mg/dL — ABNORMAL HIGH (ref 70–99)
Glucose-Capillary: 159 mg/dL — ABNORMAL HIGH (ref 70–99)

## 2020-06-24 MED ORDER — DARBEPOETIN ALFA 150 MCG/0.3ML IJ SOSY
PREFILLED_SYRINGE | INTRAMUSCULAR | Status: AC
  Filled 2020-06-24: qty 0.3

## 2020-06-24 NOTE — Progress Notes (Signed)
PROGRESS NOTE    [name]  TDH:[id number] DOB: 1950/06/02 DOA: 06/16/2020 PCP: Lorenda Hatchet, FNP   Brief Narrative:  [name] a 70 y.o.malewith medical history significant ofprostate CA; hypothyroidism; hypertension, hyperlipidemia, diabetes mellitus and end-stage renal disease on hemodialysis on Tuesday, Thursday, Saturday presented to the hospital with hematuria for few days.  Patient had seen urology PA as outpatient but hematuria had gotten worse so was brought into the hospital. Urology was consulted and patient underwent Foley catheter placement and a three-way irrigation.    During hospitalization, patient continued to have large hematuria with clots, patient underwent a cystoscopic evaluation with fulguration on 06/17/2020.  Patient persisted to have blood clots after fulguration so was continued on CBI.  Urology followed the patient during hospitalization.  Currently improving.   Assessment & Plan:   Principal Problem:   Gross hematuria Active Problems:   Hypercholesteremia   HTN (hypertension)   Hypothyroidism   Diabetes mellitus ([place])   ESRD (end stage renal disease) ([name])   Gross hematuria-passage of clots Likely secondary to radiation cystitis.  Urology on board and is currently on CBI. Patient underwent cystoscopy with fulguration on 06/17/2020.   Continue to follow urology recommendation.   discontinuation of Foley catheter 3/19 as per urology. Monitor overnight, checking PVR's in ok, then likley d/c sunday  ESRD on home HD Patient on [facility].   Nephrology on board for hemodialysis.    Acute on chronic anemia Likely exacerbated secondary to acute blood loss from hematuria.  Has baseline anemia of chronic disease and iron deficiency. He receives Mircera injections as outpatient.    Patient received total of 3 units of packed RBC during this hospitalization.  Hemoglobin today at 7.2.  Prostate CA status post external beam radiation in  2018.  Hypothyroidism Continue Synthroid.  TSH of 4.8  Essential hypertension Continue Coreg, Cozaar.   Blood pressure is stable at this time.  Hyperlipidemia Continue Crestor  Diabetes mellitus type 2. Hemoglobin A1c of 5.7.  On Ozempic at home.  Continue sliding-scale insulin, Accu-Cheks diabetic diet.  Latest POC glucose of 135   DVT prophylaxis: SCD/Compression stockings  Code Status: full    Code Status Orders  (From admission, onward)         Start     Ordered   06/16/20 1734  Full code  Continuous        03 /11/22 1734        Code Status History    Date Active Date Inactive Code Status Order ID Comments User Context   04/06/2020 1401 04/07/2020 0005 Full Code [id number]  [name], MD Inpatient   12/14/2018 1633 12/18/2018 1846 Full Code [id number]  Kathie Dike, MD Inpatient   12/24/2015 2117 12/27/2015 1756 Full Code [id number]  Rosita Fire, MD Inpatient   12/25/2013 1333 12/27/2013 1412 Full Code [id number]  [name], MD Inpatient   12/24/2013 2210 12/25/2013 1333 Full Code [id number]  Theressa Millard, MD Inpatient   09/14/2012 2250 09/16/2012 1447 Full Code [id number]  Rise Patience, MD Inpatient   Advance Care Planning Activity     Family Communication: wife at bedside  Disposition Plan:    The patient is from: Home  Anticipated d/c is to: Home when okay with urology.  Likely Sunday if PVR's okay   Patient currently is not medically stable to d/c.              Difficult to place patient No Consults called: None Admission status: Inpatient  Consultants:   urology  Procedures:  DG Chest 2 View  Result Date: 05/26/2020 CLINICAL DATA:  Pleural effusion. EXAM: CHEST - 2 VIEW COMPARISON:  May 04, 2020. FINDINGS: The heart size and mediastinal contours are within normal limits. Both lungs are clear. Sternotomy wires are noted. No pneumothorax or pleural effusion is noted. The visualized skeletal  structures are unremarkable. IMPRESSION: No active cardiopulmonary disease. Electronically Signed   By: Marijo Conception M.D.   On: 05/26/2020 09:18   NM Gastric Emptying  Result Date: 06/08/2020 CLINICAL DATA:  Nausea. EXAM: NUCLEAR MEDICINE GASTRIC EMPTYING SCAN TECHNIQUE: After oral ingestion of radiolabeled meal, sequential abdominal images were obtained for 120 minutes. Residual percentage of activity remaining within the stomach was calculated at 60 and 120 minutes. RADIOPHARMACEUTICALS:  Two mCi Tc-9m sulfur colloid in standardized meal COMPARISON:  May 05, 2020 FINDINGS: Expected location of the stomach in the left upper quadrant. Ingested meal empties the stomach gradually over the course of the study with 45% retention at 60 min and 3% retention at 120 min (normal retention less than 30% at a 120 min). IMPRESSION: Normal gastric emptying study. Electronically Signed   By: Constance Holster M.D.   On: 06/08/2020 18:42   US PELVIS LIMITED (TRANSABDOMINAL ONLY)  Result Date: 06/16/2020 CLINICAL DATA:  Blood clot can bladder, continuous bladder irrigation EXAM: LIMITED ULTRASOUND OF PELVIS TECHNIQUE: Limited transabdominal ultrasound examination of the pelvis was performed. COMPARISON:  None. FINDINGS: Sonographic evaluation of the bladder was performed. Foley catheter is seen within the urinary bladder. There is a 6.8 x 7.3 x 6.1 cm blood clot surrounding the Foley catheter balloon. No other filling defects within the bladder. IMPRESSION: 1. Large clot surrounding the Foley catheter as above. Electronically Signed   By: Randa Ngo M.D.   On: 06/16/2020 23:29   DG Cystogram  Result Date: 06/18/2020 CLINICAL DATA:  Cystoscopy. EXAM: CYSTOGRAM TECHNIQUE: Retrograde pyelogram. FLUOROSCOPY TIME:  Fluoroscopy Time:  28 seconds Radiation Exposure Index (if provided by the fluoroscopic device): 9.02 mGy Number of Acquired Spot Images: 2 COMPARISON:  None. FINDINGS: Retrograde pyelogram images were  obtained bilaterally. The images were not marked laterality. One of the pyelograms demonstrates no filling defects in the visualized collecting system. The other pyelogram demonstrates a filling defect in the proximal ureter. The op report states no filling defects. As a result, the filling defect on this study may represent an air bubble. IMPRESSION: Bilateral pyelograms as above. Electronically Signed   By: Dorise Bullion III M.D   On: 06/18/2020 07:08   DG C-Arm 1-60 Min  Result Date: 06/17/2020 CLINICAL DATA:  Cystogram. EXAM: DG C-ARM 1-60 MIN FLUOROSCOPY TIME:  Fluoroscopy Time:  28 seconds Number of Acquired Spot Images: 2 COMPARISON:  None. FINDINGS: Retrograde pyelogram performed bilaterally. There is an apparent defect in the proximal ureter on 1 side. The side is not marked. However, correlation with the operative note from today indicated no filling defects in this may simply represent an air bubble that was transient. No other defects identified. IMPRESSION: Retrograde pyelogram as above. Electronically Signed   By: Dorise Bullion III M.D   On: 06/17/2020 15:33     Antimicrobials:   Rocephin 3/11-3/15    Subjective: Did well overnight, still having difficulty urinating  Objective: Vitals:   06/24/20 0330 06/24/20 0350 06/24/20 0842 06/24/20 1143  BP: (!) 135/59 (!) 146/60 (!) 151/63 (!) 117/55  Pulse: 87 92 82 74  Resp: 17 16 14 14   Temp: 98.4 F (  36.9 C) 99 F (37.2 C) 98.6 F (37 C) 98.8 F (37.1 C)  TempSrc: Oral Oral Oral Oral  SpO2: 99% 99% 96% 96%  Weight:      Height:        Intake/Output Summary (Last 24 hours) at 06/24/2020 1529 Last data filed at 06/24/2020 1144 Gross per 24 hour  Intake 340 ml  Output 2235 ml  Net -1895 ml   Filed Weights   06/21/20 1000 06/21/20 1351 06/23/20 2300  Weight: 88.5 kg 87.6 kg 95.3 kg    Examination:  General exam: Appears calm and comfortable  Respiratory system: Clear to auscultation. Respiratory effort  normal. Cardiovascular system: S1 & S2 heard, RRR. No JVD, murmurs, rubs, gallops or clicks. No pedal edema. Gastrointestinal system: Abdomen is nondistended, soft and nontender. No organomegaly or masses felt. Normal bowel sounds heard. Central nervous system: Alert and oriented. No focal neurological deficits. Extremities: WWP, NV intact Skin: No rashes, lesions or ulcers Psychiatry: Judgement and insight appear normal. Mood & affect appropriate.     Data Reviewed: I have personally reviewed following labs and imaging studies  CBC: Recent Labs  Lab 06/19/20 0104 06/19/20 1516 06/20/20 1650 06/21/20 0840 06/22/20 0119 06/23/20 0159 06/24/20 0438  WBC 5.7  --   --  6.5 7.3 8.0 8.9  HGB 6.3*   < > 7.9* 7.2* 7.4* 7.1* 7.2*  HCT 19.4*   < > 24.7* 22.1* 23.3* 22.2* 23.3*  MCV 87.4  --   --  88.0 88.3 88.8 88.9  PLT 123*  --   --  130* 144* 142* 150   < > = values in this interval not displayed.   Basic Metabolic Panel: Recent Labs  Lab 06/18/20 0118 06/19/20 0104 06/20/20 0118 06/21/20 0840 06/22/20 0119 06/23/20 0159 06/23/20 0526 06/24/20 0438  NA 137   < > 138 139 140 139  --  135  K 4.2   < > 4.9 5.0 4.7 6.6* 5.0 4.7  CL 100   < > 103 104 102 103  --  98  CO2 26   < > 26 24 29 25   --  28  GLUCOSE 219*   < > 126* 201* 184* 144*  --  170*  BUN 27*   < > 54* 40* 23 43*  --  24*  CREATININE 3.87*   < > 6.61* 5.28* 3.51* 4.83*  --  3.20*  CALCIUM 8.5*   < > 8.4* 8.3* 8.8* 8.9  --  8.9  MG 1.9  --  2.0  --  2.0 2.0  --  1.9  PHOS  --   --  5.9* 4.6  --  5.6*  --   --    < > = values in this interval not displayed.   GFR: Estimated Creatinine Clearance: 24.9 mL/min (A) (by C-G formula based on SCr of 3.2 mg/dL (H)). Liver Function Tests: Recent Labs  Lab 06/21/20 0840  ALBUMIN 2.9*   No results for input(s): LIPASE, AMYLASE in the last 168 hours. No results for input(s): AMMONIA in the last 168 hours. Coagulation Profile: Recent Labs  Lab 06/19/20 0104  INR  1.2   Cardiac Enzymes: No results for input(s): CKTOTAL, CKMB, CKMBINDEX, TROPONINI in the last 168 hours. BNP (last 3 results) No results for input(s): PROBNP in the last 8760 hours. HbA1C: No results for input(s): HGBA1C in the last 72 hours. CBG: Recent Labs  Lab 06/23/20 1207 06/23/20 1745 06/23/20 2117 06/24/20 0612 06/24/20 1145  GLUCAP  148* 152* 198* 153* 202*   Lipid Profile: No results for input(s): CHOL, HDL, LDLCALC, TRIG, CHOLHDL, LDLDIRECT in the last 72 hours. Thyroid Function Tests: No results for input(s): TSH, T4TOTAL, FREET4, T3FREE, THYROIDAB in the last 72 hours. Anemia Panel: No results for input(s): VITAMINB12, FOLATE, FERRITIN, TIBC, IRON, RETICCTPCT in the last 72 hours. Sepsis Labs: No results for input(s): PROCALCITON, LATICACIDVEN in the last 168 hours.  Recent Results (from the past 240 hour(s))  Urine Culture     Status: None   Collection Time: 06/15/20 11:59 AM   Specimen: Urine, Random  Result Value Ref Range Status   Specimen Description URINE, RANDOM  Final   Special Requests NONE  Final   Culture   Final    NO GROWTH Performed at [facility], [address] [place], [place] [address]    Report Status 06/16/2020 FINAL  Final  SARS CORONAVIRUS 2 (TAT 6-24 HRS) Nasopharyngeal Nasopharyngeal Swab     Status: None   Collection Time: 06/16/20  7:12 PM   Specimen: Nasopharyngeal Swab  Result Value Ref Range Status   SARS Coronavirus 2 NEGATIVE NEGATIVE Final    Comment: (NOTE) SARS-CoV-2 target nucleic acids are NOT DETECTED.  The SARS-CoV-2 RNA is generally detectable in upper and lower respiratory specimens during the acute phase of infection. Negative results do not preclude SARS-CoV-2 infection, do not rule out co-infections with other pathogens, and should not be used as the sole basis for treatment or other patient management decisions. Negative results must be combined with clinical observations, patient history, and  epidemiological information. The expected result is Negative.  Fact Sheet for Patients: SugarRoll.be  Fact Sheet for Healthcare Providers: https://www.woods-mathews.com/  This test is not yet approved or cleared by the [place] FDA and  has been authorized for detection and/or diagnosis of SARS-CoV-2 by FDA under an Emergency Use Authorization (EUA). This EUA will remain  in effect (meaning this test can be used) for the duration of the COVID-19 declaration under Se ction 564(b)(1) of the Act, 21 U.S.C. section 360bbb-3(b)(1), unless the authorization is terminated or revoked sooner.  Performed at [facility], [place] [address] [place], [place] [address]          Radiology Studies: No results found.      Scheduled Meds: . allopurinol  200 mg Oral Daily  . opium-belladonna  1 suppository Rectal Q8H  . calcitRIOL  0.5 mcg Oral Daily  . carvedilol  12.5 mg Oral BID WC  . chlorhexidine  15 mL Mouth Rinse Daily  . Chlorhexidine Gluconate Cloth  6 each Topical Daily  . Chlorhexidine Gluconate Cloth  6 each Topical Q0600  . Chlorhexidine Gluconate Cloth  6 each Topical Q0600  . Chlorhexidine Gluconate Cloth  6 each Topical Q0600  . darbepoetin (ARANESP) injection - DIALYSIS  150 mcg Intravenous Q Wed-HD  . diazepam  5 mg Oral Q8H  . docusate sodium  100 mg Oral BID  . feeding supplement  237 mL Oral Q24H  . insulin aspart  0-6 Units Subcutaneous TID WC  . levothyroxine  112 mcg Oral QAC breakfast  . losartan  100 mg Oral Daily  . multivitamin  1 tablet Oral QHS  . oxybutynin  5 mg Oral TID  . pantoprazole  40 mg Oral Daily  . pregabalin  150 mg Oral BID  . rosuvastatin  10 mg Oral Daily   Continuous Infusions: . sodium chloride irrigation 3,000 mL (06/21/20 2233)  LOS: 8 days    Time spent: 35 min    Nicolette Bang, MD [facility]  If 7PM-7AM, please contact night-coverage  06/24/2020,  3:29 PM

## 2020-06-24 NOTE — Progress Notes (Incomplete)
Pt back from HD, canned bladder only 105 cc in bladder

## 2020-06-24 NOTE — Progress Notes (Signed)
[facility] Progress Note   Assessment/ Plan:   Dialysis Orders: HomeHD NxStage MTWF EDW 85kg   Assessment/Plan: 1. Gross hematuria - Hx prostate ca s/p radiation therapy. Urology following -s/pcystoscopy/clot removalin OR3/12.Foley removed 3/18. 2. ESRD - Home HD. Missed last treatment. Had HD off schedule 3/12. Continue MWF schedule while here.  HD 06/23/2020, next planned for Monday 3. Hypertension/volume - BPsl elevated. Continue home meds.--Coreg, Losartan, hydralazine.UF as tolerated. 4. Anemia -continue to follow trend.  ESA as outpatient.  Restarted darbepoetin 200 mcg q Wednesday.  Transfuse as needed, Hgb 7.2 today 5. Metabolic bone disease - OPCa/Phos at goal. Continue home calcitriol.   Subjective:    Seen in room.  Feeling better.  Foley out.  Had HD late last night.  Trying to pee.     Objective:   BP (!) 151/63 (BP Location: Right Arm)   Pulse 82   Temp 98.6 F (37 C) (Oral)   Resp 14   Ht 5\' 10"  (1.778 m)   Wt 95.3 kg   SpO2 96%   BMI 30.15 kg/m   Physical Exam: Gen: NAD, sitting up in bed eating breakfast CVS: RRR Resp: clear Abd: soft Ext: trace LE edema ACCESS: L AVF  Labs: BMET Recent Labs  Lab 06/18/20 0118 06/19/20 0104 06/20/20 0118 06/21/20 0840 06/22/20 0119 06/23/20 0159 06/23/20 0526 06/24/20 0438  NA 137 139 138 139 140 139  --  135  K 4.2 4.6 4.9 5.0 4.7 6.6* 5.0 4.7  CL 100 100 103 104 102 103  --  98  CO2 26 29 26 24 29 25   --  28  GLUCOSE 219* 181* 126* 201* 184* 144*  --  170*  BUN 27* 43* 54* 40* 23 43*  --  24*  CREATININE 3.87* 5.84* 6.61* 5.28* 3.51* 4.83*  --  3.20*  CALCIUM 8.5* 8.2* 8.4* 8.3* 8.8* 8.9  --  8.9  PHOS  --   --  5.9* 4.6  --  5.6*  --   --    CBC Recent Labs  Lab 06/21/20 0840 06/22/20 0119 06/23/20 0159 06/24/20 0438  WBC 6.5 7.3 8.0 8.9  HGB 7.2* 7.4* 7.1* 7.2*  HCT 22.1* 23.3* 22.2* 23.3*  MCV 88.0 88.3 88.8 88.9  PLT 130* 144* 142* 150       Medications:    . allopurinol  200 mg Oral Daily  . opium-belladonna  1 suppository Rectal Q8H  . calcitRIOL  0.5 mcg Oral Daily  . carvedilol  12.5 mg Oral BID WC  . chlorhexidine  15 mL Mouth Rinse Daily  . Chlorhexidine Gluconate Cloth  6 each Topical Daily  . Chlorhexidine Gluconate Cloth  6 each Topical Q0600  . Chlorhexidine Gluconate Cloth  6 each Topical Q0600  . Chlorhexidine Gluconate Cloth  6 each Topical Q0600  . darbepoetin (ARANESP) injection - DIALYSIS  150 mcg Intravenous Q Wed-HD  . diazepam  5 mg Oral Q8H  . docusate sodium  100 mg Oral BID  . feeding supplement  237 mL Oral Q24H  . insulin aspart  0-6 Units Subcutaneous TID WC  . levothyroxine  112 mcg Oral QAC breakfast  . losartan  100 mg Oral Daily  . multivitamin  1 tablet Oral QHS  . oxybutynin  5 mg Oral TID  . pantoprazole  40 mg Oral Daily  . pregabalin  150 mg Oral BID  . rosuvastatin  10 mg Oral Daily     Madelon Lips, MD 06/24/2020, 11:24 AM

## 2020-06-24 NOTE — Progress Notes (Signed)
MD notified of 32mL urinary retention.  Verbal order to bladder scan in AM.

## 2020-06-24 NOTE — Progress Notes (Signed)
Pt leaving for HD, VSS

## 2020-06-24 NOTE — Progress Notes (Signed)
7 Days Post-Op  Subjective: [name] is doing better.  He has voided small amounts of dark but transparent urine.  He has urgency but bladder scan overnight didn't show much residual.   ROS:  ROS  Anti-infectives: Anti-infectives (From admission, onward)   Start     Dose/Rate Route Frequency Ordered Stop   06/16/20 1900  cefTRIAXone (ROCEPHIN) 1 g in sodium chloride 0.9 % 100 mL IVPB  Status:  Discontinued        1 g 200 mL/hr over 30 Minutes Intravenous Every 24 hours 06/16/20 1855 06/20/20 0606      Current Facility-Administered Medications  Medication Dose Route Frequency Provider Last Rate Last Admin  . acetaminophen (TYLENOL) tablet 650 mg  650 mg Oral Q6H PRN Celene Squibb, MD       Or  . acetaminophen (TYLENOL) suppository 650 mg  650 mg Rectal Q6H PRN Celene Squibb, MD      . allopurinol (ZYLOPRIM) tablet 200 mg  200 mg Oral Daily Celene Squibb, MD   200 mg at 06/24/20 0940  . belladonna-opium (B&O) suppository 16.2-60mg   1 suppository Rectal Q8H Celene Squibb, MD   1 suppository at 06/23/20 0509  . calcitRIOL (ROCALTROL) capsule 0.5 mcg  0.5 mcg Oral Daily Celene Squibb, MD   0.5 mcg at 06/24/20 0944  . calcium carbonate (dosed in mg elemental calcium) suspension 500 mg of elemental calcium  500 mg of elemental calcium Oral Q6H PRN Celene Squibb, MD      . camphor-menthol [facility]) lotion 1 application  1 application Topical F7P PRN Celene Squibb, MD       And  . hydrOXYzine (ATARAX/VISTARIL) tablet 25 mg  25 mg Oral Q8H PRN Celene Squibb, MD      . carvedilol (COREG) tablet 12.5 mg  12.5 mg Oral BID WC Celene Squibb, MD   12.5 mg at 06/24/20 0940  . chlorhexidine (PERIDEX) 0.12 % solution 15 mL  15 mL Mouth Rinse Daily Celene Squibb, MD   15 mL at 06/24/20 0941  . Chlorhexidine Gluconate Cloth 2 % PADS 6 each  6 each Topical Daily Celene Squibb, MD   6 each at 06/21/20 [phone]  . Chlorhexidine Gluconate Cloth 2 % PADS 6 each  6 each Topical  Q0600 Celene Squibb, MD   6 each at 06/22/20 [phone]  . Chlorhexidine Gluconate Cloth 2 % PADS 6 each  6 each Topical Q0600 Edrick Oh, MD   6 each at 06/22/20 [phone]  . Chlorhexidine Gluconate Cloth 2 % PADS 6 each  6 each Topical Q0600 Edrick Oh, MD   6 each at 06/24/20 0941  . Darbepoetin Alfa (ARANESP) injection 150 mcg  150 mcg Intravenous Q Wed-HD Edrick Oh, MD   150 mcg at 06/21/20 1349  . diazepam (VALIUM) tablet 5 mg  5 mg Oral Q8H Celene Squibb, MD   5 mg at 06/24/20 [phone]  . docusate sodium (COLACE) capsule 100 mg  100 mg Oral BID Celene Squibb, MD   100 mg at 06/23/20 2211  . docusate sodium (ENEMEEZ) enema 283 mg  1 enema Rectal PRN Celene Squibb, MD      . feeding supplement (ENSURE ENLIVE / ENSURE PLUS) liquid 237 mL  237 mL Oral Q24H Pokhrel, Laxman, MD      . hydrALAZINE (APRESOLINE) injection 5 mg  5 mg Intravenous Q4H PRN Celene Squibb, MD   5 mg at 06/16/20 2220  . insulin aspart (novoLOG) injection 0-6 Units  0-6 Units Subcutaneous TID WC Celene Squibb, MD  1 Units at 06/22/20 1745  . levothyroxine (SYNTHROID) tablet 112 mcg  112 mcg Oral QAC breakfast Celene Squibb, MD   112 mcg at 06/24/20 [phone]  . losartan (COZAAR) tablet 100 mg  100 mg Oral Daily Celene Squibb, MD   100 mg at 06/24/20 0940  . morphine 2 MG/ML injection 2 mg  2 mg Intravenous Q2H PRN Celene Squibb, MD   2 mg at 06/23/20 1200  . multivitamin (RENA-VIT) tablet 1 tablet  1 tablet Oral QHS Pokhrel, Laxman, MD   1 tablet at 06/23/20 2211  . ondansetron (ZOFRAN) tablet 4 mg  4 mg Oral Q6H PRN Celene Squibb, MD       Or  . ondansetron [facility]) injection 4 mg  4 mg Intravenous Q6H PRN Celene Squibb, MD      . oxybutynin (DITROPAN) tablet 5 mg  5 mg Oral TID Celene Squibb, MD   5 mg at 06/24/20 0939  . oxyCODONE-acetaminophen (PERCOCET/ROXICET) 5-325 MG per tablet 1-2 tablet  1-2 tablet Oral Q4H PRN Celene Squibb, MD   2 tablet at 06/22/20 2023  . pantoprazole  (PROTONIX) EC tablet 40 mg  40 mg Oral Daily Celene Squibb, MD   40 mg at 06/24/20 0939  . polyvinyl alcohol (LIQUIFILM TEARS) 1.4 % ophthalmic solution 2 drop  2 drop Both Eyes TID PRN Celene Squibb, MD   2 drop at 06/17/20 0953  . pregabalin (LYRICA) capsule 150 mg  150 mg Oral BID Celene Squibb, MD   150 mg at 06/24/20 0939  . rosuvastatin (CRESTOR) tablet 10 mg  10 mg Oral Daily Celene Squibb, MD   10 mg at 06/24/20 0940  . sodium chloride irrigation 0.9 % 3,000 mL  3,000 mL Irrigation Continuous Celene Squibb, MD 200 mL/hr at 06/21/20 2233 3,000 mL at 06/22/20 0428  . sorbitol 70 % solution 30 mL  30 mL Oral PRN Celene Squibb, MD      . zolpidem [facility]) tablet 5 mg  5 mg Oral QHS PRN Celene Squibb, MD   5 mg at 06/18/20 2010     Objective: Vital signs in last 24 hours: Temp:  [98.2 F (36.8 C)-99.2 F (37.3 C)] 98.8 F (37.1 C) (03/19 1143) Pulse Rate:  [66-101] 74 (03/19 1143) Resp:  [13-21] 14 (03/19 1143) BP: (100-153)/(48-94) 117/55 (03/19 1143) SpO2:  [77 %-100 %] 96 % (03/19 1143) Weight:  [95.3 kg] 95.3 kg (03/18 2300)  Intake/Output from previous day: 03/18 0701 - 03/19 0700 In: 120 [P.O.:120] Out: 2675 [Urine:635] Intake/Output this shift: Total I/O In: 220 [P.O.:220] Out: 60 [Urine:60]   Physical Exam Bladder not palpable.   Lab Results:  Recent Labs    06/23/20 0159 06/24/20 0438  WBC 8.0 8.9  HGB 7.1* 7.2*  HCT 22.2* 23.3*  PLT 142* 150   BMET Recent Labs    06/23/20 0159 06/23/20 0526 06/24/20 0438  NA 139  --  135  K 6.6* 5.0 4.7  CL 103  --  98  CO2 25  --  28  GLUCOSE 144*  --  170*  BUN 43*  --  24*  CREATININE 4.83*  --  3.20*  CALCIUM 8.9  --  8.9   PT/INR No results for input(s): LABPROT, INR in the last 72 hours. ABG No results for input(s): PHART, HCO3 in the last 72 hours.  Invalid input(s): PCO2, PO2  Studies/Results: No results found.   Assessment and Plan: Radiation cystitis with  hematuria.  He is voiding small amounts with  dark but transparent urine.  He still has urgency but the bladder is not palpable.  He will have a PVR checked again and if low, he could be discharged either today or tomorrow.       LOS: 8 days    Irine Seal 06/24/2020 [phone] ID: Francis Dowse, male   DOB: 1950/06/03, 70 y.o.   MRN: [id number]

## 2020-06-25 ENCOUNTER — Inpatient Hospital Stay (HOSPITAL_COMMUNITY): Payer: Medicare Other

## 2020-06-25 DIAGNOSIS — E11621 Type 2 diabetes mellitus with foot ulcer: Secondary | ICD-10-CM | POA: Diagnosis not present

## 2020-06-25 DIAGNOSIS — I10 Essential (primary) hypertension: Secondary | ICD-10-CM | POA: Diagnosis not present

## 2020-06-25 DIAGNOSIS — R310 Gross hematuria: Secondary | ICD-10-CM | POA: Diagnosis not present

## 2020-06-25 DIAGNOSIS — N186 End stage renal disease: Secondary | ICD-10-CM | POA: Diagnosis not present

## 2020-06-25 LAB — BASIC METABOLIC PANEL
Anion gap: 12 (ref 5–15)
BUN: 40 mg/dL — ABNORMAL HIGH (ref 8–23)
CO2: 25 mmol/L (ref 22–32)
Calcium: 8.7 mg/dL — ABNORMAL LOW (ref 8.9–10.3)
Chloride: 99 mmol/L (ref 98–111)
Creatinine, Ser: 4.61 mg/dL — ABNORMAL HIGH (ref 0.61–1.24)
GFR, Estimated: 13 mL/min — ABNORMAL LOW (ref >60)
Glucose, Bld: 206 mg/dL — ABNORMAL HIGH (ref 70–99)
Potassium: 4.8 mmol/L (ref 3.5–5.1)
Sodium: 136 mmol/L (ref 135–145)

## 2020-06-25 LAB — CBC WITH DIFFERENTIAL/PLATELET
Abs Immature Granulocytes: 0.31 10*3/uL — ABNORMAL HIGH (ref 0.00–0.07)
Basophils Absolute: 0 10*3/uL (ref 0.0–0.1)
Basophils Relative: 1 %
Eosinophils Absolute: 0.2 10*3/uL (ref 0.0–0.5)
Eosinophils Relative: 3 %
HCT: 19.9 % — ABNORMAL LOW (ref 39.0–52.0)
Hemoglobin: 6.3 g/dL — CL (ref 13.0–17.0)
Immature Granulocytes: 6 %
Lymphocytes Relative: 9 %
Lymphs Abs: 0.5 10*3/uL — ABNORMAL LOW (ref 0.7–4.0)
MCH: 28.1 pg (ref 26.0–34.0)
MCHC: 31.7 g/dL (ref 30.0–36.0)
MCV: 88.8 fL (ref 80.0–100.0)
Monocytes Absolute: 0.5 10*3/uL (ref 0.1–1.0)
Monocytes Relative: 9 %
Neutro Abs: 4.1 10*3/uL (ref 1.7–7.7)
Neutrophils Relative %: 72 %
Platelets: 145 10*3/uL — ABNORMAL LOW (ref 150–400)
RBC: 2.24 MIL/uL — ABNORMAL LOW (ref 4.22–5.81)
RDW: 17.3 % — ABNORMAL HIGH (ref 11.5–15.5)
WBC: 5.6 10*3/uL (ref 4.0–10.5)
nRBC: 0 % (ref 0.0–0.2)

## 2020-06-25 LAB — PREPARE RBC (CROSSMATCH)

## 2020-06-25 LAB — HEMOGLOBIN AND HEMATOCRIT, BLOOD
HCT: 20 % — ABNORMAL LOW (ref 39.0–52.0)
HCT: 24.4 % — ABNORMAL LOW (ref 39.0–52.0)
Hemoglobin: 6.2 g/dL — CL (ref 13.0–17.0)
Hemoglobin: 7.5 g/dL — ABNORMAL LOW (ref 13.0–17.0)

## 2020-06-25 LAB — GLUCOSE, CAPILLARY
Glucose-Capillary: 170 mg/dL — ABNORMAL HIGH (ref 70–99)
Glucose-Capillary: 168 mg/dL — ABNORMAL HIGH (ref 70–99)
Glucose-Capillary: 288 mg/dL — ABNORMAL HIGH (ref 70–99)
Glucose-Capillary: 183 mg/dL — ABNORMAL HIGH (ref 70–99)

## 2020-06-25 MED ORDER — DARBEPOETIN ALFA 300 MCG/0.6ML IJ SOSY: 300.0000 ug | PREFILLED_SYRINGE | INTRAMUSCULAR | Status: DC

## 2020-06-25 MED ORDER — TAMSULOSIN HCL 0.4 MG PO CAPS
0.4000 mg | ORAL_CAPSULE | Freq: Every day | ORAL | Status: DC
  Administered 2020-06-25 – 2020-07-01 (×6): 0.4 mg via ORAL
  Filled 2020-06-25 (×6): qty 1

## 2020-06-25 MED ORDER — SODIUM CHLORIDE 0.9% IV SOLUTION: Freq: Once | INTRAVENOUS | Status: DC

## 2020-06-25 NOTE — Progress Notes (Signed)
Sent page to Dr. Milford Cage advised to review result of pelvic ultrasound in Epic. Result called to me by radiology due to indication of clot in bladder. Ultrasound ordered by Dr. Jeffie Pollock on day shift due to patient still having dark urine. Patient is stable at this time.

## 2020-06-25 NOTE — Progress Notes (Signed)
Patient ID: [name], male   DOB: [birth year], 70 y.o.   MRN: [id number]  I discussed with situation with his wife.     I am going to order a pelvic US for in the morning to make sure he doesn't have a lot of clot in the bladder since the urine is still dark.

## 2020-06-25 NOTE — Progress Notes (Signed)
PROGRESS NOTE    [name]  LOV:[id number] DOB: [birth year] DOA: 06/16/2020 PCP: Lorenda Hatchet, FNP   Brief Narrative:  [name] a 70 y.o.malewith medical history significant ofprostate CA; hypothyroidism; hypertension, hyperlipidemia, diabetes mellitus and end-stage renal disease on hemodialysis on Tuesday,Thursday,Saturday presented to thehospital with hematuria for few days. Patient had seen urology PA as outpatient but hematuriahad gotten worse so was brought into the hospital. Urology was consulted and patient underwent Foley catheter placement and a three-way irrigation.   During hospitalization, patient continued to have large hematuria with clots, patient underwent a cystoscopic evaluation with fulguration on 06/17/2020. Patient persisted to have blood clots after fulguration so was continued on CBI. Urology followed the patient during hospitalization.Currently improving.   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 Urology on board and was on CBI. Patient underwent cystoscopy with fulguration on 06/17/2020. Continue to follow urology recommendation. discontinuation of Foley catheter 3/19 as per urology. Monitoring, checking PVR's, plan was to d/c over the weekend, although patient continues to have hematuria and required transfusion Sunday with a hemoglobin of 6.3  ESRD on home HD Patient on [facility]. Nephrology on board for hemodialysis.   Acute on chronic anemia Likely exacerbated secondary to acute blood loss from hematuria. Has baseline anemia of chronic disease and iron deficiency. He receives Mircera injections as outpatient. Patient receivedtotal of 5 units of packed RBC during this  hospitalization. Hemoglobin today at 6.3, transfused 2 additional units overnight for total of 5 during this admission  Prostate CAstatus post external beam radiation in 2018.  Hypothyroidism Continue Synthroid. TSH of 4.8  Essential hypertension Continue Coreg, Cozaar. Blood pressure is stable at this time.  Hyperlipidemia Continue Crestor  Diabetes mellitus type 2. Hemoglobin A1c of 5.7. On Ozempic at home. Continue sliding-scale insulin,Accu-Cheks diabetic diet. Latest POC glucose of 135  DVT prophylaxis: SCD/Compression stockings  Code Status: Full    Code Status Orders  (From admission, onward)         Start     Ordered   06/16/20 1734  Full code  Continuous        03 /11/22 1734        Code Status History    Date Active Date Inactive Code Status Order ID Comments User Context   04/06/2020 1401 04/07/2020 0005 Full Code [id number]  [name], MD Inpatient   12/14/2018 1633 12/18/2018 1846 Full Code [id number]  Kathie Dike, MD Inpatient   12/24/2015 2117 12/27/2015 1756 Full Code [id number]  Rosita Fire, MD Inpatient   12/25/2013 1333 12/27/2013 1412 Full Code [id number]  [name], MD Inpatient   12/24/2013 2210 12/25/2013 1333 Full Code [id number]  Theressa Millard, MD Inpatient   09/14/2012 2250 09/16/2012 1447 Full Code [id number]  Rise Patience, MD Inpatient   Advance Care Planning Activity     Family Communication: none today, discussed with wife on saturday at bedside  Disposition Plan:    Inpt The patient is from: Home Anticipated d/c is to: [facility] okay with urology. Patient currently is not medically stable to d/c with active gross hematuria requiring transfusions Difficult to place patient No Consults called: None Admission status: Inpatient   Consultants:   urology  Procedures:  NM Gastric Emptying  Result Date: 06/08/2020 CLINICAL  DATA:  Nausea. EXAM: NUCLEAR MEDICINE  GASTRIC EMPTYING SCAN TECHNIQUE: After oral ingestion of radiolabeled meal, sequential abdominal images were obtained for 120 minutes. Residual percentage of activity remaining within the stomach was calculated at 60 and 120 minutes. RADIOPHARMACEUTICALS:  Two mCi Tc-67m sulfur colloid in standardized meal COMPARISON:  May 05, 2020 FINDINGS: Expected location of the stomach in the left upper quadrant. Ingested meal empties the stomach gradually over the course of the study with 45% retention at 60 min and 3% retention at 120 min (normal retention less than 30% at a 120 min). IMPRESSION: Normal gastric emptying study. Electronically Signed   By: Constance Holster M.D.   On: 06/08/2020 18:42   US PELVIS LIMITED (TRANSABDOMINAL ONLY)  Result Date: 06/16/2020 CLINICAL DATA:  Blood clot can bladder, continuous bladder irrigation EXAM: LIMITED ULTRASOUND OF PELVIS TECHNIQUE: Limited transabdominal ultrasound examination of the pelvis was performed. COMPARISON:  None. FINDINGS: Sonographic evaluation of the bladder was performed. Foley catheter is seen within the urinary bladder. There is a 6.8 x 7.3 x 6.1 cm blood clot surrounding the Foley catheter balloon. No other filling defects within the bladder. IMPRESSION: 1. Large clot surrounding the Foley catheter as above. Electronically Signed   By: Randa Ngo M.D.   On: 06/16/2020 23:29   DG Cystogram  Result Date: 06/18/2020 CLINICAL DATA:  Cystoscopy. EXAM: CYSTOGRAM TECHNIQUE: Retrograde pyelogram. FLUOROSCOPY TIME:  Fluoroscopy Time:  28 seconds Radiation Exposure Index (if provided by the fluoroscopic device): 9.02 mGy Number of Acquired Spot Images: 2 COMPARISON:  None. FINDINGS: Retrograde pyelogram images were obtained bilaterally. The images were not marked laterality. One of the pyelograms demonstrates no filling defects in the visualized collecting system. The other pyelogram demonstrates a filling defect in the proximal ureter. The op report  states no filling defects. As a result, the filling defect on this study may represent an air bubble. IMPRESSION: Bilateral pyelograms as above. Electronically Signed   By: Dorise Bullion III M.D   On: 06/18/2020 07:08   DG C-Arm 1-60 Min  Result Date: 06/17/2020 CLINICAL DATA:  Cystogram. EXAM: DG C-ARM 1-60 MIN FLUOROSCOPY TIME:  Fluoroscopy Time:  28 seconds Number of Acquired Spot Images: 2 COMPARISON:  None. FINDINGS: Retrograde pyelogram performed bilaterally. There is an apparent defect in the proximal ureter on 1 side. The side is not marked. However, correlation with the operative note from today indicated no filling defects in this may simply represent an air bubble that was transient. No other defects identified. IMPRESSION: Retrograde pyelogram as above. Electronically Signed   By: Dorise Bullion III M.D   On: 06/17/2020 15:33     Antimicrobials:   Completed 5 days of Rocephin   Subjective: Patient reported continued hematuria overnight As noted above hemoglobin low, transfusion started overnight and just completed this afternoon  Objective: Vitals:   06/25/20 0553 06/25/20 0750 06/25/20 0947 06/25/20 1138  BP: (!) 92/49 (!) 104/42 132/65 (!) 147/70  Pulse: 73 66  67  Resp: 14 15 18 14   Temp: 97.9 F (36.6 C) 97.7 F (36.5 C) 97.8 F (36.6 C) 98.2 F (36.8 C)  TempSrc: Oral Oral Oral Oral  SpO2: 92% 95%  96%  Weight:      Height:        Intake/Output Summary (Last 24 hours) at 06/25/2020 1325 Last data filed at 06/25/2020 1113 Gross per 24 hour  Intake 1206.5 ml  Output 268 ml  Net 938.5 ml   Filed Weights   06/21/20 1351 06/23/20  2300 06/25/20 0436  Weight: 87.6 kg 95.3 kg 92.1 kg    Examination:  General exam: Appears calm and comfortable  Respiratory system: Clear to auscultation. Respiratory effort normal. Cardiovascular system: S1 & S2 heard, RRR. No JVD, murmurs, rubs, gallops or clicks. No pedal edema. Gastrointestinal system: Abdomen is  nondistended, soft and nontender. No organomegaly or masses felt. Normal bowel sounds heard. Central nervous system: Alert and oriented. No focal neurological deficits. Extremities: Warm well perfused, trace edema. Skin: No rashes, lesions or ulcers Psychiatry: Judgement and insight appear normal. Mood & affect appropriate.     Data Reviewed: I have personally reviewed following labs and imaging studies  CBC: Recent Labs  Lab 06/21/20 0840 06/22/20 0119 06/23/20 0159 06/24/20 0438 06/25/20 0130 06/25/20 0401  WBC 6.5 7.3 8.0 8.9 5.6  --   NEUTROABS  --   --   --   --  4.1  --   HGB 7.2* 7.4* 7.1* 7.2* 6.3* 6.2*  HCT 22.1* 23.3* 22.2* 23.3* 19.9* 20.0*  MCV 88.0 88.3 88.8 88.9 88.8  --   PLT 130* 144* 142* 150 145*  --    Basic Metabolic Panel: Recent Labs  Lab 06/20/20 0118 06/21/20 0840 06/22/20 0119 06/23/20 0159 06/23/20 0526 06/24/20 0438 06/25/20 0130  NA 138 139 140 139  --  135 136  K 4.9 5.0 4.7 6.6* 5.0 4.7 4.8  CL 103 104 102 103  --  98 99  CO2 26 24 29 25   --  28 25  GLUCOSE 126* 201* 184* 144*  --  170* 206*  BUN 54* 40* 23 43*  --  24* 40*  CREATININE 6.61* 5.28* 3.51* 4.83*  --  3.20* 4.61*  CALCIUM 8.4* 8.3* 8.8* 8.9  --  8.9 8.7*  MG 2.0  --  2.0 2.0  --  1.9  --   PHOS 5.9* 4.6  --  5.6*  --   --   --    GFR: Estimated Creatinine Clearance: 17 mL/min (A) (by C-G formula based on SCr of 4.61 mg/dL (H)). Liver Function Tests: Recent Labs  Lab 06/21/20 0840  ALBUMIN 2.9*   No results for input(s): LIPASE, AMYLASE in the last 168 hours. No results for input(s): AMMONIA in the last 168 hours. Coagulation Profile: Recent Labs  Lab 06/19/20 0104  INR 1.2   Cardiac Enzymes: No results for input(s): CKTOTAL, CKMB, CKMBINDEX, TROPONINI in the last 168 hours. BNP (last 3 results) No results for input(s): PROBNP in the last 8760 hours. HbA1C: No results for input(s): HGBA1C in the last 72 hours. CBG: Recent Labs  Lab 06/24/20 1145  06/24/20 1621 06/24/20 2148 06/25/20 0640 06/25/20 1116  GLUCAP 202* 154* 159* 170* 168*   Lipid Profile: No results for input(s): CHOL, HDL, LDLCALC, TRIG, CHOLHDL, LDLDIRECT in the last 72 hours. Thyroid Function Tests: No results for input(s): TSH, T4TOTAL, FREET4, T3FREE, THYROIDAB in the last 72 hours. Anemia Panel: No results for input(s): VITAMINB12, FOLATE, FERRITIN, TIBC, IRON, RETICCTPCT in the last 72 hours. Sepsis Labs: No results for input(s): PROCALCITON, LATICACIDVEN in the last 168 hours.  Recent Results (from the past 240 hour(s))  SARS CORONAVIRUS 2 (TAT 6-24 HRS) Nasopharyngeal Nasopharyngeal Swab     Status: None   Collection Time: 06/16/20  7:12 PM   Specimen: Nasopharyngeal Swab  Result Value Ref Range Status   SARS Coronavirus 2 NEGATIVE NEGATIVE Final    Comment: (NOTE) SARS-CoV-2 target nucleic acids are NOT DETECTED.  The SARS-CoV-2 RNA is  generally detectable in upper and lower respiratory specimens during the acute phase of infection. Negative results do not preclude SARS-CoV-2 infection, do not rule out co-infections with other pathogens, and should not be used as the sole basis for treatment or other patient management decisions. Negative results must be combined with clinical observations, patient history, and epidemiological information. The expected result is Negative.  Fact Sheet for Patients: SugarRoll.be  Fact Sheet for Healthcare Providers: https://www.woods-mathews.com/  This test is not yet approved or cleared by the [place] FDA and  has been authorized for detection and/or diagnosis of SARS-CoV-2 by FDA under an Emergency Use Authorization (EUA). This EUA will remain  in effect (meaning this test can be used) for the duration of the COVID-19 declaration under Se ction 564(b)(1) of the Act, 21 U.S.C. section 360bbb-3(b)(1), unless the authorization is terminated or revoked  sooner.  Performed at [name], [place] [address], [place], [place] [address]          Radiology Studies: No results found.      Scheduled Meds: . sodium chloride   Intravenous Once  . allopurinol  200 mg Oral Daily  . calcitRIOL  0.5 mcg Oral Daily  . carvedilol  12.5 mg Oral BID WC  . chlorhexidine  15 mL Mouth Rinse Daily  . Chlorhexidine Gluconate Cloth  6 each Topical Daily  . Chlorhexidine Gluconate Cloth  6 each Topical Q0600  . Chlorhexidine Gluconate Cloth  6 each Topical Q0600  . Chlorhexidine Gluconate Cloth  6 each Topical Q0600  . [START ON 06/28/2020] darbepoetin (ARANESP) injection - DIALYSIS  300 mcg Intravenous Q Wed-HD  . diazepam  5 mg Oral Q8H  . docusate sodium  100 mg Oral BID  . feeding supplement  237 mL Oral Q24H  . insulin aspart  0-6 Units Subcutaneous TID WC  . levothyroxine  112 mcg Oral QAC breakfast  . losartan  100 mg Oral Daily  . multivitamin  1 tablet Oral QHS  . pantoprazole  40 mg Oral Daily  . pregabalin  150 mg Oral BID  . rosuvastatin  10 mg Oral Daily  . tamsulosin  0.4 mg Oral Daily   Continuous Infusions: . sodium chloride irrigation 3,000 mL (06/21/20 2233)     LOS: 9 days    Time spent: 35 min    Nicolette Bang, MD [facility]  If 7PM-7AM, please contact night-coverage  06/25/2020, 1:25 PM

## 2020-06-25 NOTE — Progress Notes (Signed)
8 Days Post-Op  Subjective: [name] is doing better.   He is on oxybutynin and has gotten B&O suppositories which can have a negative impact on voiding. He continues to void small amounts with some difficulty but his PVR is stable at about 361ml and he is much more comfortable without the catheter.   ROS:  ROS  Anti-infectives: Anti-infectives (From admission, onward)   Start     Dose/Rate Route Frequency Ordered Stop   06/16/20 1900  cefTRIAXone (ROCEPHIN) 1 g in sodium chloride 0.9 % 100 mL IVPB  Status:  Discontinued        1 g 200 mL/hr over 30 Minutes Intravenous Every 24 hours 06/16/20 1855 06/20/20 0606      Current Facility-Administered Medications  Medication Dose Route Frequency Provider Last Rate Last Admin  . 0.9 %  sodium chloride infusion (Manually program via Guardrails IV Fluids)   Intravenous Once Adefeso, Olad[place], DO      . acetaminophen (TYLENOL) tablet 650 mg  650 mg Oral Q6H PRN Celene Squibb, MD       Or  . acetaminophen (TYLENOL) suppository 650 mg  650 mg Rectal Q6H PRN Celene Squibb, MD      . allopurinol (ZYLOPRIM) tablet 200 mg  200 mg Oral Daily Celene Squibb, MD   200 mg at 06/24/20 0940  . belladonna-opium (B&O) suppository 16.2-60mg   1 suppository Rectal Q8H Celene Squibb, MD   1 suppository at 06/24/20 1357  . calcitRIOL (ROCALTROL) capsule 0.5 mcg  0.5 mcg Oral Daily Celene Squibb, MD   0.5 mcg at 06/24/20 0944  . calcium carbonate (dosed in mg elemental calcium) suspension 500 mg of elemental calcium  500 mg of elemental calcium Oral Q6H PRN Celene Squibb, MD      . camphor-menthol [facility]) lotion 1 application  1 application Topical O9G PRN Celene Squibb, MD       And  . hydrOXYzine (ATARAX/VISTARIL) tablet 25 mg  25 mg Oral Q8H PRN Celene Squibb, MD      . carvedilol (COREG) tablet 12.5 mg  12.5 mg Oral BID WC Celene Squibb, MD   12.5 mg at 06/24/20 1716  . chlorhexidine (PERIDEX) 0.12 % solution 15 mL  15 mL Mouth Rinse  Daily Celene Squibb, MD   15 mL at 06/24/20 0941  . Chlorhexidine Gluconate Cloth 2 % PADS 6 each  6 each Topical Daily Celene Squibb, MD   6 each at 06/21/20 [phone]  . Chlorhexidine Gluconate Cloth 2 % PADS 6 each  6 each Topical Q0600 Celene Squibb, MD   6 each at 06/22/20 [phone]  . Chlorhexidine Gluconate Cloth 2 % PADS 6 each  6 each Topical Q0600 Edrick Oh, MD   6 each at 06/22/20 [phone]  . Chlorhexidine Gluconate Cloth 2 % PADS 6 each  6 each Topical Q0600 Edrick Oh, MD   6 each at 06/24/20 0941  . [START ON 06/28/2020] Darbepoetin Alfa (ARANESP) injection 300 mcg  300 mcg Intravenous Q Wed-HD Madelon Lips, MD      . diazepam (VALIUM) tablet 5 mg  5 mg Oral Q8H Celene Squibb, MD   5 mg at 06/24/20 1357  . docusate sodium (COLACE) capsule 100 mg  100 mg Oral BID Celene Squibb, MD   100 mg at 06/24/20 2215  . docusate sodium (ENEMEEZ) enema 283 mg  1 enema Rectal PRN Celene Squibb, MD      . feeding supplement (ENSURE ENLIVE / ENSURE PLUS) liquid 237 mL  237 mL Oral Q24H  Pokhrel, Laxman, MD   237 mL at 06/24/20 1715  . hydrALAZINE (APRESOLINE) injection 5 mg  5 mg Intravenous Q4H PRN Celene Squibb, MD   5 mg at 06/16/20 2220  . insulin aspart (novoLOG) injection 0-6 Units  0-6 Units Subcutaneous TID WC Celene Squibb, MD   1 Units at 06/25/20 [phone]  . levothyroxine (SYNTHROID) tablet 112 mcg  112 mcg Oral QAC breakfast Celene Squibb, MD   112 mcg at 06/25/20 [phone]  . losartan (COZAAR) tablet 100 mg  100 mg Oral Daily Celene Squibb, MD   100 mg at 06/24/20 0940  . morphine 2 MG/ML injection 2 mg  2 mg Intravenous Q2H PRN Celene Squibb, MD   2 mg at 06/23/20 1200  . multivitamin (RENA-VIT) tablet 1 tablet  1 tablet Oral QHS Pokhrel, Laxman, MD   1 tablet at 06/24/20 2215  . ondansetron (ZOFRAN) tablet 4 mg  4 mg Oral Q6H PRN Celene Squibb, MD       Or  . ondansetron [facility]) injection 4 mg  4 mg Intravenous Q6H PRN Celene Squibb, MD      .  oxybutynin (DITROPAN) tablet 5 mg  5 mg Oral TID Celene Squibb, MD   5 mg at 06/24/20 2200  . oxyCODONE-acetaminophen (PERCOCET/ROXICET) 5-325 MG per tablet 1-2 tablet  1-2 tablet Oral Q4H PRN Celene Squibb, MD   2 tablet at 06/22/20 2023  . pantoprazole (PROTONIX) EC tablet 40 mg  40 mg Oral Daily Celene Squibb, MD   40 mg at 06/24/20 0939  . polyvinyl alcohol (LIQUIFILM TEARS) 1.4 % ophthalmic solution 2 drop  2 drop Both Eyes TID PRN Celene Squibb, MD   2 drop at 06/17/20 0953  . pregabalin (LYRICA) capsule 150 mg  150 mg Oral BID Celene Squibb, MD   150 mg at 06/24/20 2216  . rosuvastatin (CRESTOR) tablet 10 mg  10 mg Oral Daily Celene Squibb, MD   10 mg at 06/24/20 0940  . sodium chloride irrigation 0.9 % 3,000 mL  3,000 mL Irrigation Continuous Celene Squibb, MD 200 mL/hr at 06/21/20 2233 3,000 mL at 06/22/20 0428  . sorbitol 70 % solution 30 mL  30 mL Oral PRN Celene Squibb, MD      . zolpidem [facility]) tablet 5 mg  5 mg Oral QHS PRN Celene Squibb, MD   5 mg at 06/18/20 2010     Objective: Vital signs in last 24 hours: Temp:  [97.7 F (36.5 C)-98.8 F (37.1 C)] 97.7 F (36.5 C) (03/20 0750) Pulse Rate:  [66-82] 66 (03/20 0750) Resp:  [14-18] 15 (03/20 0750) BP: (92-142)/(41-60) 104/42 (03/20 0750) SpO2:  [92 %-98 %] 95 % (03/20 0750) Weight:  [92.1 kg] 92.1 kg (03/20 0436)  Intake/Output from previous day: 03/19 0701 - 03/20 0700 In: 590 [P.O.:340; I.V.:250] Out: 210 [Urine:210] Intake/Output this shift: No intake/output data recorded.   Physical Exam  Lab Results:  Recent Labs    06/24/20 0438 06/25/20 0130 06/25/20 0401  WBC 8.9 5.6  --   HGB 7.2* 6.3* 6.2*  HCT 23.3* 19.9* 20.0*  PLT 150 145*  --    BMET Recent Labs    06/24/20 0438 06/25/20 0130  NA 135 136  K 4.7 4.8  CL 98 99  CO2 28 25  GLUCOSE 170* 206*  BUN 24* 40*  CREATININE 3.20* 4.61*  CALCIUM 8.9 8.7*   PT/INR No results for input(s): LABPROT, INR in  the last 72 hours. ABG No results for input(s): PHART, HCO3  in the last 72 hours.  Invalid input(s): PCO2, PO2  Studies/Results: No results found.   Assessment and Plan: Radiation cystitis with hematuria.  He is voiding small amounts and his PVR remains stable at about 374ml.   He is more comfortable without the catheter and hopefully voiding will improve further with the addition of tamsulosin and cessation of oxybutynin and B&O's..  He will continue daily PVR's.      LOS: 9 days    Irine Seal 06/25/2020 [phone] ID: [name], male   DOB: 1950-06-19, 70 y.o.   MRN: [id number] Patient ID: [name], male   DOB: Mar 30, 1951, 70 y.o.   MRN: [id number]

## 2020-06-25 NOTE — Progress Notes (Signed)
Lab called a critical HGB 21f 6.3 paged Dr. Josephine Cables who order PRBC but wants to wait until repeat H&H drawn before transfusion

## 2020-06-25 NOTE — Progress Notes (Addendum)
2:39 AM RN called due to patient's hemoglobin at 6.3.  A repeat H&H, type and screen and 2 units of PRBCs were reserved to be transfused based on repeated H/H

## 2020-06-25 NOTE — Progress Notes (Addendum)
[facility] Progress Note   Assessment/ Plan:   Dialysis Orders: HomeHD NxStage MTWF EDW 85kg   Assessment/Plan: 1. Gross hematuria - Hx prostate ca s/p radiation therapy. Urology following -s/pcystoscopy/clot removalin OR3/12.Foley removed 3/18.  On Flomax. Off oxybutinin 2. ESRD - Home HD. Continue MWF schedule while here.  HD 06/23/2020, next planned for Monday.  No heparin with HD  3. Hypertension/volume - BPsl elevated. Continue home meds.--Coreg, Losartan, hydralazine.UF as tolerated. 4. Anemia -continue to follow trend.  ESA as outpatient.  Restarted darbepoetin 200 mcg q Wednesday, increase to 300 mcg next dose.  Transfuse as needed, getting 1 u pRBCs this AM 4/78. 5. Metabolic bone disease - OPCa/Phos at goal. Continue home calcitriol. 6. DM: per primary 7. H/o osteomyelitis 8. Dispo: pending   Subjective:    Says that his voiding is better overnight.  Much more comfortable without the catheter.  For HD tomorrow.  Hgb 6.2, getting blood this AM   Objective:   BP 132/65   Pulse 66   Temp 97.8 F (36.6 C) (Oral)   Resp 18   Ht 5\' 10"  (1.778 m)   Wt 92.1 kg   SpO2 95%   BMI 29.13 kg/m   Physical Exam: Gen: NAD, sitting up in bed eating breakfast--> I helped him clean his tray up since it was making him very uncomfortable CVS: RRR Resp: clear Abd: soft Ext: trace LE edema ACCESS: L AVF  Labs: BMET Recent Labs  Lab 06/19/20 0104 06/20/20 0118 06/21/20 0840 06/22/20 0119 06/23/20 0159 06/23/20 0526 06/24/20 0438 06/25/20 0130  NA 139 138 139 140 139  --  135 136  K 4.6 4.9 5.0 4.7 6.6* 5.0 4.7 4.8  CL 100 103 104 102 103  --  98 99  CO2 29 26 24 29 25   --  28 25  GLUCOSE 181* 126* 201* 184* 144*  --  170* 206*  BUN 43* 54* 40* 23 43*  --  24* 40*  CREATININE 5.84* 6.61* 5.28* 3.51* 4.83*  --  3.20* 4.61*  CALCIUM 8.2* 8.4* 8.3* 8.8* 8.9  --  8.9 8.7*  PHOS  --  5.9* 4.6  --  5.6*  --   --   --    CBC Recent Labs   Lab 06/22/20 0119 06/23/20 0159 06/24/20 0438 06/25/20 0130 06/25/20 0401  WBC 7.3 8.0 8.9 5.6  --   NEUTROABS  --   --   --  4.1  --   HGB 7.4* 7.1* 7.2* 6.3* 6.2*  HCT 23.3* 22.2* 23.3* 19.9* 20.0*  MCV 88.3 88.8 88.9 88.8  --   PLT 144* 142* 150 145*  --       Medications:    . sodium chloride   Intravenous Once  . allopurinol  200 mg Oral Daily  . calcitRIOL  0.5 mcg Oral Daily  . carvedilol  12.5 mg Oral BID WC  . chlorhexidine  15 mL Mouth Rinse Daily  . Chlorhexidine Gluconate Cloth  6 each Topical Daily  . Chlorhexidine Gluconate Cloth  6 each Topical Q0600  . Chlorhexidine Gluconate Cloth  6 each Topical Q0600  . Chlorhexidine Gluconate Cloth  6 each Topical Q0600  . [START ON 06/28/2020] darbepoetin (ARANESP) injection - DIALYSIS  300 mcg Intravenous Q Wed-HD  . diazepam  5 mg Oral Q8H  . docusate sodium  100 mg Oral BID  . feeding supplement  237 mL Oral Q24H  . insulin aspart  0-6 Units Subcutaneous TID WC  .  levothyroxine  112 mcg Oral QAC breakfast  . losartan  100 mg Oral Daily  . multivitamin  1 tablet Oral QHS  . pantoprazole  40 mg Oral Daily  . pregabalin  150 mg Oral BID  . rosuvastatin  10 mg Oral Daily  . tamsulosin  0.4 mg Oral Daily     Madelon Lips, MD 06/25/2020, 9:54 AM

## 2020-06-26 LAB — GLUCOSE, CAPILLARY
Glucose-Capillary: 187 mg/dL — ABNORMAL HIGH (ref 70–99)
Glucose-Capillary: 198 mg/dL — ABNORMAL HIGH (ref 70–99)
Glucose-Capillary: 171 mg/dL — ABNORMAL HIGH (ref 70–99)

## 2020-06-26 LAB — CBC WITH DIFFERENTIAL/PLATELET
Abs Immature Granulocytes: 0.45 10*3/uL — ABNORMAL HIGH (ref 0.00–0.07)
Basophils Absolute: 0 10*3/uL (ref 0.0–0.1)
Basophils Relative: 1 %
Eosinophils Absolute: 0.2 10*3/uL (ref 0.0–0.5)
Eosinophils Relative: 3 %
HCT: 25.2 % — ABNORMAL LOW (ref 39.0–52.0)
Hemoglobin: 7.8 g/dL — ABNORMAL LOW (ref 13.0–17.0)
Immature Granulocytes: 7 %
Lymphocytes Relative: 7 %
Lymphs Abs: 0.5 10*3/uL — ABNORMAL LOW (ref 0.7–4.0)
MCH: 27.5 pg (ref 26.0–34.0)
MCHC: 31 g/dL (ref 30.0–36.0)
MCV: 88.7 fL (ref 80.0–100.0)
Monocytes Absolute: 0.6 10*3/uL (ref 0.1–1.0)
Monocytes Relative: 9 %
Neutro Abs: 5.1 10*3/uL (ref 1.7–7.7)
Neutrophils Relative %: 73 %
Platelets: 138 10*3/uL — ABNORMAL LOW (ref 150–400)
RBC: 2.84 MIL/uL — ABNORMAL LOW (ref 4.22–5.81)
RDW: 17.2 % — ABNORMAL HIGH (ref 11.5–15.5)
WBC: 6.8 10*3/uL (ref 4.0–10.5)
nRBC: 0 % (ref 0.0–0.2)

## 2020-06-26 LAB — TYPE AND SCREEN
ABO/RH(D): O POS
Antibody Screen: NEGATIVE
Unit division: 0

## 2020-06-26 LAB — BPAM RBC
Blood Product Expiration Date: 202204212359
ISSUE DATE / TIME: 202203200527
Unit Type and Rh: 5100

## 2020-06-26 LAB — BASIC METABOLIC PANEL
Anion gap: 10 (ref 5–15)
BUN: 53 mg/dL — ABNORMAL HIGH (ref 8–23)
CO2: 24 mmol/L (ref 22–32)
Calcium: 8.8 mg/dL — ABNORMAL LOW (ref 8.9–10.3)
Chloride: 104 mmol/L (ref 98–111)
Creatinine, Ser: 5.39 mg/dL — ABNORMAL HIGH (ref 0.61–1.24)
GFR, Estimated: 11 mL/min — ABNORMAL LOW (ref >60)
Glucose, Bld: 199 mg/dL — ABNORMAL HIGH (ref 70–99)
Potassium: 5 mmol/L (ref 3.5–5.1)
Sodium: 138 mmol/L (ref 135–145)

## 2020-06-26 MED ORDER — DARBEPOETIN ALFA 200 MCG/0.4ML IJ SOSY
200.0000 ug | PREFILLED_SYRINGE | INTRAMUSCULAR | Status: DC
  Filled 2020-06-26: qty 0.4

## 2020-06-26 NOTE — Progress Notes (Addendum)
PT Cancellation Note  Patient Details Name: [name] MRN: [id number] DOB: October 09, 1950   Cancelled Treatment:    Reason Eval/Treat Not Completed: (P) Patient at procedure or test/unavailable at HD dept (10:20 AM).  Tried again 1620 pm, pt refusing despite encouragement from PTA and his spouse. Pt has now refused x5 PT treatment attempts, please reconsult as needed.  Carly M Poff 06/26/2020, 10:20 AM

## 2020-06-26 NOTE — Progress Notes (Signed)
PROGRESS NOTE    [name]  MBE:[id number] DOB: 09/07/1950 DOA: 06/16/2020 PCP: Lorenda Hatchet, FNP   Brief Narrative:  [name] a 70 y.o.malewith medical history significant ofprostate CA; hypothyroidism; hypertension, hyperlipidemia, diabetes mellitus and end-stage renal disease on hemodialysis on Tuesday,Thursday,Saturday presented to thehospital with hematuria for few days. Patient had seen urology PA as outpatient but hematuriahad gotten worse so was brought into the hospital. Urology was consulted and patient underwent Foley catheter placement and a three-way irrigation.   During hospitalization, patient continued to have large hematuria with clots, patient underwent a cystoscopic evaluation with fulguration on 06/17/2020. Patient persisted to have blood clots after fulguration so was continued on CBI. Urology followed the patient during hospitalization.Currently improving.   Assessment & Plan:   Principal Problem:   Gross hematuria Active Problems:   Hypercholesteremia   HTN (hypertension)   Hypothyroidism   Diabetes mellitus ([place])   ESRD (end stage renal disease) ([place])   Gross hematuria-passage of clots Likely secondary to radiation cystitis. Urology on board and was on CBI. Patient underwent cystoscopy with fulguration on 06/17/2020. Continue to follow urology recommendation.discontinuation of Foley catheter 3/19as per urology. Monitoring, checking PVR's, plan was to d/c over the weekend, although patient continues to have hematuria and required transfusion Sunday with a hemoglobin of 6.3, now stable at 7.8 -Appreciate further Urology recommendations given clot in bladder.  ESRD on home HD Patient on [facility]. Nephrology on board for hemodialysis.   Acute on chronic anemia Likely exacerbated secondary to acute blood loss from hematuria. Has baseline anemia of chronic disease and iron deficiency. He receives Mircera injections  as outpatient. Patient receivedtotal of 5 units of packed RBC during this hospitalization. Hemoglobin today at 6.3, transfused 2 additional units overnight for total of 5 during this admission  Prostate CAstatus post external beam radiation in 2018.  Hypothyroidism Continue Synthroid. TSH of 4.8  Essential hypertension Continue Coreg, Cozaar. Blood pressure is stable at this time.  Hyperlipidemia Continue Crestor  Diabetes mellitus type 2. Hemoglobin A1c of 5.7. On Ozempic at home. Continue sliding-scale insulin,Accu-Cheks diabetic diet. Latest POC glucose of 198   DVT prophylaxis:SCDs Code Status: Full Family Communication: Discussed with wife on phone 3/21 Disposition Plan:  Status is: Inpatient  Remains inpatient appropriate because:IV treatments appropriate due to intensity of illness or inability to take PO and Inpatient level of care appropriate due to severity of illness   Dispo: The patient is from: Home              Anticipated d/c is to: Home              Patient currently is not medically stable to d/c.   Difficult to place patient No   Consultants:   Urology  Nephrology  Procedures:   None  Antimicrobials:  Anti-infectives (From admission, onward)   Start     Dose/Rate Route Frequency Ordered Stop   06/16/20 1900  cefTRIAXone (ROCEPHIN) 1 g in sodium chloride 0.9 % 100 mL IVPB  Status:  Discontinued        1 g 200 mL/hr over 30 Minutes Intravenous Every 24 hours 06/16/20 1855 06/20/20 0606       Subjective: Patient seen and evaluated today with no new acute complaints or concerns. No acute concerns or events noted overnight. He continues to have ongoing hematuria.  Objective: Vitals:   06/26/20 1040 06/26/20 1115 06/26/20 1155 06/26/20 1225  BP: (!) 114/47 (!) 129/53 (!) 125/51 (!) 116/56  Pulse:  Resp: 15 16 15 12   Temp:      TempSrc:      SpO2:      Weight:      Height:        Intake/Output Summary (Last 24 hours)  at 06/26/2020 1243 Last data filed at 06/26/2020 0825 Gross per 24 hour  Intake -  Output 290 ml  Net -290 ml   Filed Weights   06/25/20 0436 06/26/20 0456 06/26/20 0840  Weight: 92.1 kg 93.3 kg 94.6 kg    Examination:  General exam: Appears calm and comfortable  Respiratory system: Clear to auscultation. Respiratory effort normal. Cardiovascular system: S1 & S2 heard, RRR.  Gastrointestinal system: Abdomen is soft Central nervous system: Alert and awake Extremities: No edema Skin: No significant lesions noted Psychiatry: Flat affect.    Data Reviewed: I have personally reviewed following labs and imaging studies  CBC: Recent Labs  Lab 06/22/20 0119 06/23/20 0159 06/24/20 0438 06/25/20 0130 06/25/20 0401 06/25/20 1939 06/26/20 0147  WBC 7.3 8.0 8.9 5.6  --   --  6.8  NEUTROABS  --   --   --  4.1  --   --  5.1  HGB 7.4* 7.1* 7.2* 6.3* 6.2* 7.5* 7.8*  HCT 23.3* 22.2* 23.3* 19.9* 20.0* 24.4* 25.2*  MCV 88.3 88.8 88.9 88.8  --   --  88.7  PLT 144* 142* 150 145*  --   --  979*   Basic Metabolic Panel: Recent Labs  Lab 06/20/20 0118 06/21/20 0840 06/22/20 0119 06/23/20 0159 06/23/20 0526 06/24/20 0438 06/25/20 0130 06/26/20 0147  NA 138 139 140 139  --  135 136 138  K 4.9 5.0 4.7 6.6* 5.0 4.7 4.8 5.0  CL 103 104 102 103  --  98 99 104  CO2 26 24 29 25   --  28 25 24   GLUCOSE 126* 201* 184* 144*  --  170* 206* 199*  BUN 54* 40* 23 43*  --  24* 40* 53*  CREATININE 6.61* 5.28* 3.51* 4.83*  --  3.20* 4.61* 5.39*  CALCIUM 8.4* 8.3* 8.8* 8.9  --  8.9 8.7* 8.8*  MG 2.0  --  2.0 2.0  --  1.9  --   --   PHOS 5.9* 4.6  --  5.6*  --   --   --   --    GFR: Estimated Creatinine Clearance: 14.7 mL/min (A) (by C-G formula based on SCr of 5.39 mg/dL (H)). Liver Function Tests: Recent Labs  Lab 06/21/20 0840  ALBUMIN 2.9*   No results for input(s): LIPASE, AMYLASE in the last 168 hours. No results for input(s): AMMONIA in the last 168 hours. Coagulation Profile: No  results for input(s): INR, PROTIME in the last 168 hours. Cardiac Enzymes: No results for input(s): CKTOTAL, CKMB, CKMBINDEX, TROPONINI in the last 168 hours. BNP (last 3 results) No results for input(s): PROBNP in the last 8760 hours. HbA1C: No results for input(s): HGBA1C in the last 72 hours. CBG: Recent Labs  Lab 06/25/20 0640 06/25/20 1116 06/25/20 1702 06/25/20 2155 06/26/20 0707  GLUCAP 170* 168* 288* 183* 187*   Lipid Profile: No results for input(s): CHOL, HDL, LDLCALC, TRIG, CHOLHDL, LDLDIRECT in the last 72 hours. Thyroid Function Tests: No results for input(s): TSH, T4TOTAL, FREET4, T3FREE, THYROIDAB in the last 72 hours. Anemia Panel: No results for input(s): VITAMINB12, FOLATE, FERRITIN, TIBC, IRON, RETICCTPCT in the last 72 hours. Sepsis Labs: No results for input(s): PROCALCITON, LATICACIDVEN in the last 168  hours.  Recent Results (from the past 240 hour(s))  SARS CORONAVIRUS 2 (TAT 6-24 HRS) Nasopharyngeal Nasopharyngeal Swab     Status: None   Collection Time: 06/16/20  7:12 PM   Specimen: Nasopharyngeal Swab  Result Value Ref Range Status   SARS Coronavirus 2 NEGATIVE NEGATIVE Final    Comment: (NOTE) SARS-CoV-2 target nucleic acids are NOT DETECTED.  The SARS-CoV-2 RNA is generally detectable in upper and lower respiratory specimens during the acute phase of infection. Negative results do not preclude SARS-CoV-2 infection, do not rule out co-infections with other pathogens, and should not be used as the sole basis for treatment or other patient management decisions. Negative results must be combined with clinical observations, patient history, and epidemiological information. The expected result is Negative.  Fact Sheet for Patients: SugarRoll.be  Fact Sheet for Healthcare Providers: https://www.woods-mathews.com/  This test is not yet approved or cleared by the [place] FDA and  has been authorized  for detection and/or diagnosis of SARS-CoV-2 by FDA under an Emergency Use Authorization (EUA). This EUA will remain  in effect (meaning this test can be used) for the duration of the COVID-19 declaration under Se ction 564(b)(1) of the Act, 21 U.S.C. section 360bbb-3(b)(1), unless the authorization is terminated or revoked sooner.  Performed at [facility], [place] [address] [place], [place] [address]          Radiology Studies: US PELVIS LIMITED (TRANSABDOMINAL ONLY)  Result Date: 06/25/2020 CLINICAL DATA:  Evaluate for retained clot in urinary bladder. Gross hematuria. EXAM: URINARY TRACT ULTRASOUND COMPLETE COMPARISON:  Ultrasound pelvis 06/16/2020 FINDINGS: Urinary bladder: There is a 7.1 x 4.1 x 3 cm (6mL) heterogeneous avascular hyperechoic foci within the urinary bladder lumen that is consistent with a known clot. IMPRESSION: 1. Several foci of gas associated with the urinary bladder. Unclear if finding is within the urinary bladder lumen versus within the urinary bladder wall. If within the wall finding is concerning for emphysematous cystitis. If within the lumen, finding could be related recent instrumentation versus infection. Correlate clinically. 2. Persistent 7.1 cm avascular heterogeneous hyperechoic foci within urinary bladder lumen consistent with known clot. These results will be called to the ordering clinician or representative by the Radiologist Assistant, and communication documented in the PACS or [employer]. Electronically Signed   By: Iven Finn M.D.   On: 06/25/2020 20:10        Scheduled Meds: . sodium chloride   Intravenous Once  . allopurinol  200 mg Oral Daily  . calcitRIOL  0.5 mcg Oral Daily  . carvedilol  12.5 mg Oral BID WC  . chlorhexidine  15 mL Mouth Rinse Daily  . Chlorhexidine Gluconate Cloth  6 each Topical Daily  . Chlorhexidine Gluconate Cloth  6 each Topical Q0600  . [START ON 06/28/2020] darbepoetin (ARANESP) injection -  DIALYSIS  200 mcg Intravenous Q Wed-HD  . diazepam  5 mg Oral Q8H  . docusate sodium  100 mg Oral BID  . feeding supplement  237 mL Oral Q24H  . insulin aspart  0-6 Units Subcutaneous TID WC  . levothyroxine  112 mcg Oral QAC breakfast  . losartan  100 mg Oral Daily  . multivitamin  1 tablet Oral QHS  . pantoprazole  40 mg Oral Daily  . pregabalin  150 mg Oral BID  . rosuvastatin  10 mg Oral Daily  . tamsulosin  0.4 mg Oral Daily   Continuous Infusions: . sodium chloride irrigation 3,000 mL (06/21/20 2233)  LOS: 10 days    Time spent: 35 minutes    Pratik Darleen Crocker, DO [facility]  If 7PM-7AM, please contact night-coverage [email or web address] 06/26/2020, 12:43 PM

## 2020-06-26 NOTE — Care Management Important Message (Signed)
Important Message  Patient Details  Name: [name] MRN: [id number] Date of Birth: July 28, 1950   Medicare Important Message Given:  Yes     [name] 06/26/2020, 11:34 AM

## 2020-06-26 NOTE — Progress Notes (Signed)
Occupational Therapy Treatment Patient Details Name: Rece Zechman MRN: [id number] DOB: 02-Nov-1950 Today's Date: 06/26/2020    History of present illness Patient is a 70 y/o male who presents on 06/16/20 with large hematuria with clots s/p cystoscopy, clot evacation and fulgeration of bladder 06/17/20. s/p multiple blood transfusions. 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   OT comments  Pt making steady progress towards OT goals this session. Pt in bathroom upon arrival, needing MIN A for safety and balance to ambulate to sink with rollator, pt with one posterior LOB during transitions needing MIN A to recover. Pt required cues to recall needing to unlock brakes on rollator during mobility tasks.  Pt able to stand at sink for grooming tasks with min guard assist- MIN A for dynamic tasks at sink. Pt currently requires total A for LB ADLs but reports at baseline he is able to complete LB tasks independently. Pt really wants to go home today, however pt refusing all [place] services. Pt does seem to be well equipped for DME needs. Do agree that pt would need 24 hour assist as indicated below on initial OT eval. Will continue to follow acutely per POC for OT needs.    Follow Up Recommendations  Supervision/Assistance - 24 hour    Equipment Recommendations  None recommended by OT    Recommendations for Other Services      Precautions / Restrictions Precautions Precautions: Fall;Other (comment) Precaution Comments: has B LE braces at baseline, uses Rollator for mobility Restrictions Weight Bearing Restrictions: No       Mobility Bed Mobility Overal bed mobility: Needs Assistance Bed Mobility: Sit to Supine       Sit to supine: Modified independent (Device/Increase time);HOB elevated   General bed mobility comments: no physical assist needed, use of elevate HOB and bed rails    Transfers Overall transfer level: Needs assistance Equipment used: Rolling  walker (2 wheeled) Transfers: Sit to/from Stand Sit to Stand: Min guard         General transfer comment: min guard from toilet and to safely descend onto EOB    Balance Overall balance assessment: Needs assistance Sitting-balance support: Feet supported;No upper extremity supported Sitting balance-Leahy Scale: Good     Standing balance support: Single extremity supported;During functional activity Standing balance-Leahy Scale: Poor Standing balance comment: at least one UE supported during ADLs for static tasks, pt would need external assist for dynamic tasks with no UE support                           ADL either performed or assessed with clinical judgement   ADL Overall ADL's : Needs assistance/impaired     Grooming: Wash/dry hands;Standing;Min guard;Minimal assistance Grooming Details (indicate cue type and reason): min guard for static standing grooming tasks, MIN A for dynamic tasks             Lower Body Dressing: Total assistance;Bed level Lower Body Dressing Details (indicate cue type and reason): to doff shoes Toilet Transfer: Minimal assistance;RW;Ambulation Toilet Transfer Details (indicate cue type and reason): MIN A for balance d/t ambulation as pt not wearing leg braces upon arrival, moderately unsteadying during household distance functional mobility tasks Toileting- Water quality scientist and Hygiene: Sit to/from stand;Min Child psychotherapist Details (indicate cue type and reason): pt reports tub shower with bench at home Functional mobility during ADLs: Min guard;Minimal assistance;Rolling walker General ADL Comments: pt  completing standing grooming tasks, toileting, functional mobility, and bed mobility. pt continues to present with impaired balance, generalized deconditioning and decreased ability to care for self     Vision       Perception     Praxis      Cognition Arousal/Alertness: Awake/alert Behavior During Therapy:  WFL for tasks assessed/performed Overall Cognitive Status: No family/caregiver present to determine baseline cognitive functioning                                 General Comments: overall WFL for mobility tasks        Exercises     Shoulder Instructions       General Comments pt reprots at baseline his wife doesn't help him, uses rollator and can usually perform LB ADLs    Pertinent Vitals/ Pain       Pain Assessment: Faces Faces Pain Scale: Hurts a little bit Pain Location: general discomfort with mobility Pain Descriptors / Indicators: Discomfort Pain Intervention(s): Monitored during session;Repositioned  Home Living                                          Prior Functioning/Environment              Frequency           Progress Toward Goals  OT Goals(current goals can now be found in the care plan section)  Progress towards OT goals: Progressing toward goals  Acute Rehab OT Goals Patient Stated Goal: to go home today OT Goal Formulation: With patient Time For Goal Achievement: 07/04/20 Potential to Achieve Goals: Good  Plan Discharge plan remains appropriate;Frequency remains appropriate    Co-evaluation                 AM-PAC OT "6 Clicks" Daily Activity     Outcome Measure   Help from another person eating meals?: None Help from another person taking care of personal grooming?: A Little Help from another person toileting, which includes using toliet, bedpan, or urinal?: A Little Help from another person bathing (including washing, rinsing, drying)?: A Little Help from another person to put on and taking off regular upper body clothing?: None Help from another person to put on and taking off regular lower body clothing?: A Lot 6 Click Score: 19    End of Session Equipment Utilized During Treatment: Other (comment) (rollator)  OT Visit Diagnosis: Muscle weakness (generalized) (M62.81)   Activity Tolerance  Patient tolerated treatment well   Patient Left in bed;with call bell/phone within reach;with bed alarm set   Nurse Communication Mobility status        Time: 3664-4034 OT Time Calculation (min): 16 min  Charges: OT General Charges $OT Visit: 1 Visit OT Treatments $Self Care/Home Management : 8-22 mins  Harley Alto., COTA/L Acute Rehabilitation Services [phone]    Precious Haws 06/26/2020, 3:46 PM

## 2020-06-26 NOTE — Progress Notes (Signed)
[facility] Progress Note  Subjective: seen on HD, no new c/o's.   Vitals:   06/26/20 1115 06/26/20 1155 06/26/20 1225 06/26/20 1235  BP: (!) 129/53 (!) 125/51 (!) 116/56 (!) 125/55  Pulse:    75  Resp: 16 15 12 14   Temp:    98.5 F (36.9 C)  TempSrc:    Oral  SpO2:    99%  Weight:    92.3 kg  Height:        Exam:   alert, nad   no jvd  Chest cta bilat  Cor reg no RG  Abd soft ntnd no ascites   Ext no LE edema   Alert, NF, ox3   L AVF +bruit    OP HD:  HomeHD NxStage MTWF EDW 85kg No heparin  - last esa was mircera 150 ug in Nov 2021, none since  Assessment/Plan: 1. Gross hematuria/ radiation cystitis - Hx prostate ca s/p radiation therapy. Urology following -s/pcystoscopy/clot removalin OR3/12.Foley removed 3/18.  On Flomax. Off oxybutinin 2. ESRD - Home HD. Continue MWF schedule while here. Next HD planned for today. No heparin w/ HD.  3. Hypertension/volume - BPsl elevated. Continue home meds - Coreg, Losartan, hydralazine.  4. Anemia - not on ESA as outpatient. Restarted darbepoetin 200 mcg q Wednesday here. Transfuse as needed. SP total 4u pRBCs this admit. 5. Metabolic bone disease - OPCa/Phos at goal. Continue home calcitriol. 6. DM: per primary 7. H/o osteomyelitis     Rob Schertz 06/26/2020, 12:52 PM   Recent Labs  Lab 06/21/20 0840 06/22/20 0119 06/23/20 0159 06/23/20 0526 06/25/20 0130 06/25/20 0401 06/25/20 1939 06/26/20 0147  K 5.0   < > 6.6*   < > 4.8  --   --  5.0  BUN 40*   < > 43*   < > 40*  --   --  53*  CREATININE 5.28*   < > 4.83*   < > 4.61*  --   --  5.39*  CALCIUM 8.3*   < > 8.9   < > 8.7*  --   --  8.8*  PHOS 4.6  --  5.6*  --   --   --   --   --   HGB 7.2*   < > 7.1*   < > 6.3*   < > 7.5* 7.8*   < > = values in this interval not displayed.   Inpatient medications: . sodium chloride   Intravenous Once  . allopurinol  200 mg Oral Daily  . calcitRIOL  0.5 mcg Oral Daily  . carvedilol  12.5 mg  Oral BID WC  . chlorhexidine  15 mL Mouth Rinse Daily  . Chlorhexidine Gluconate Cloth  6 each Topical Daily  . Chlorhexidine Gluconate Cloth  6 each Topical Q0600  . [START ON 06/28/2020] darbepoetin (ARANESP) injection - DIALYSIS  200 mcg Intravenous Q Wed-HD  . diazepam  5 mg Oral Q8H  . docusate sodium  100 mg Oral BID  . feeding supplement  237 mL Oral Q24H  . insulin aspart  0-6 Units Subcutaneous TID WC  . levothyroxine  112 mcg Oral QAC breakfast  . losartan  100 mg Oral Daily  . multivitamin  1 tablet Oral QHS  . pantoprazole  40 mg Oral Daily  . pregabalin  150 mg Oral BID  . rosuvastatin  10 mg Oral Daily  . tamsulosin  0.4 mg Oral Daily   . sodium chloride irrigation 3,000 mL (06/21/20 2233)  acetaminophen **OR** acetaminophen, calcium carbonate (dosed in mg elemental calcium), camphor-menthol **AND** hydrOXYzine, docusate sodium, hydrALAZINE, morphine injection, ondansetron **OR** ondansetron (ZOFRAN) IV, oxyCODONE-acetaminophen, polyvinyl alcohol, sorbitol, zolpidem

## 2020-06-27 LAB — BASIC METABOLIC PANEL
Anion gap: 8 (ref 5–15)
BUN: 35 mg/dL — ABNORMAL HIGH (ref 8–23)
CO2: 29 mmol/L (ref 22–32)
Calcium: 8.8 mg/dL — ABNORMAL LOW (ref 8.9–10.3)
Chloride: 104 mmol/L (ref 98–111)
Creatinine, Ser: 3.94 mg/dL — ABNORMAL HIGH (ref 0.61–1.24)
GFR, Estimated: 16 mL/min — ABNORMAL LOW (ref >60)
Glucose, Bld: 170 mg/dL — ABNORMAL HIGH (ref 70–99)
Potassium: 4.6 mmol/L (ref 3.5–5.1)
Sodium: 141 mmol/L (ref 135–145)

## 2020-06-27 LAB — CBC
HCT: 24.4 % — ABNORMAL LOW (ref 39.0–52.0)
Hemoglobin: 7.9 g/dL — ABNORMAL LOW (ref 13.0–17.0)
MCH: 28.1 pg (ref 26.0–34.0)
MCHC: 32.4 g/dL (ref 30.0–36.0)
MCV: 86.8 fL (ref 80.0–100.0)
Platelets: 154 10*3/uL (ref 150–400)
RBC: 2.81 MIL/uL — ABNORMAL LOW (ref 4.22–5.81)
RDW: 16.9 % — ABNORMAL HIGH (ref 11.5–15.5)
WBC: 7 10*3/uL (ref 4.0–10.5)
nRBC: 0 % (ref 0.0–0.2)

## 2020-06-27 LAB — GLUCOSE, CAPILLARY
Glucose-Capillary: 244 mg/dL — ABNORMAL HIGH (ref 70–99)
Glucose-Capillary: 160 mg/dL — ABNORMAL HIGH (ref 70–99)
Glucose-Capillary: 172 mg/dL — ABNORMAL HIGH (ref 70–99)
Glucose-Capillary: 129 mg/dL — ABNORMAL HIGH (ref 70–99)
Glucose-Capillary: 197 mg/dL — ABNORMAL HIGH (ref 70–99)

## 2020-06-27 LAB — MAGNESIUM: Magnesium: 1.8 mg/dL (ref 1.7–2.4)

## 2020-06-27 MED ORDER — CEFAZOLIN SODIUM-DEXTROSE 2-4 GM/100ML-% IV SOLN: 2.0000 g | INTRAVENOUS | Status: DC

## 2020-06-27 NOTE — Progress Notes (Signed)
PROGRESS NOTE    [name]  OVZ:[id number] DOB: [birth year] DOA: 06/16/2020 PCP: Lorenda Hatchet, FNP   Brief Narrative:  [name] a 70 y.o.malewith medical history significant ofprostate CA; hypothyroidism; hypertension, hyperlipidemia, diabetes mellitus and end-stage renal disease on hemodialysis on Tuesday,Thursday,Saturday presented to thehospital with hematuria for few days. Patient had seen urology PA as outpatient but hematuriahad gotten worse so was brought into the hospital. Urology was consulted and patient underwent Foley catheter placement and a three-way irrigation.   During hospitalization, patient continued to have large hematuria with clots, patient underwent a cystoscopic evaluation with fulguration on 06/17/2020. Patient persisted to have blood clots after fulguration so was continued on CBI.  Urology planning for repeat cystoscopy after hemodialysis tomorrow.  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 Urology on board andwason CBI. Patient underwent cystoscopy with fulguration on 06/17/2020. Continue to follow urology recommendation.discontinuation of Foley catheter 3/19as per urology. Monitoring, checking PVR's, plan was to d/c over theweekend, although patient continues to have hematuria and required transfusion Sunday with a hemoglobin of 6.3, now stable at 7.9 -Appreciate further Urology recommendations given clot in bladder.  Plans for further cystoscopy evaluation tomorrow after hemodialysis.  ESRD on home HD Patient on [facility]. Nephrology on board for hemodialysis.  Further hemodialysis on 3/23  Acute on chronic anemia-stable Likely exacerbated secondary to acute blood loss from  hematuria. Has baseline anemia of chronic disease and iron deficiency. He receives Mircera injections as outpatient. Patient receivedtotal of5units of packed RBC during this hospitalization. Hemoglobin today at6.3, transfused 2 additional units overnight for total of 5 during this admission -Follow CBC in a.m.  Prostate CAstatus post external beam radiation in 2018.  Hypothyroidism Continue Synthroid. TSH of 4.8  Essential hypertension Continue Coreg, Cozaar. Blood pressure is stable at this time.  Hyperlipidemia Continue Crestor  Diabetes mellitus type 2. Hemoglobin A1c of 5.7. On Ozempic at home. Continue sliding-scale insulin,Accu-Cheks diabetic diet. Latest POC glucose of 172   DVT prophylaxis:SCDs Code Status: Full Family Communication: Discussed with wife on phone 3/22 Disposition Plan:  Status is: Inpatient  Remains inpatient appropriate because:IV treatments appropriate due to intensity of illness or inability to take PO and Inpatient level of care appropriate due to severity of illness   Dispo: The patient is from: Home  Anticipated d/c is to: Home  Patient currently is not medically stable to d/c.              Difficult to place patient No   Consultants:   Urology  Nephrology  Procedures:   None  Antimicrobials:  Anti-infectives (From admission, onward)   Start     Dose/Rate Route Frequency Ordered Stop   06/28/20 1500  ceFAZolin (ANCEF) IVPB 2g/100 mL premix        2 g 200 mL/hr over 30 Minutes Intravenous To ShortStay Procedural 06/27/20 0928 06/29/20 1500   06/16/20 1900  cefTRIAXone (ROCEPHIN) 1 g in sodium chloride 0.9 % 100 mL IVPB  Status:  Discontinued        1 g 200 mL/hr over 30 Minutes Intravenous Every 24 hours 06/16/20 1855 06/20/20 0606       Subjective: Patient seen and evaluated today and appears to be quite drowsy.  He did receive Valium and Ambien last night.  He continues to  have ongoing dark urine output with hematuria.  Objective: Vitals:   06/26/20 2348 06/27/20 0401 06/27/20 0817 06/27/20 1126  BP: (!) 159/68 (!) 160/70 (!) 144/73 132/63  Pulse: 85 86 78 74  Resp: 14 13 20 19   Temp: 98.1 F (36.7 C) 98.5 F (36.9 C) 98 F (36.7 C) 97.6 F (36.4 C)  TempSrc: Oral Oral Oral Oral  SpO2: 97% 99% 100% 98%  Weight:  90.1 kg    Height:        Intake/Output Summary (Last 24 hours) at 06/27/2020 1141 Last data filed at 06/27/2020 0400 Gross per 24 hour  Intake 150 ml  Output 2200 ml  Net -2050 ml   Filed Weights   06/26/20 0840 06/26/20 1235 06/27/20 0401  Weight: 94.6 kg 92.3 kg 90.1 kg    Examination:  General exam: Appears somnolent Respiratory system: Clear to auscultation. Respiratory effort normal. Cardiovascular system: S1 & S2 heard, RRR.  Gastrointestinal system: Abdomen is soft Central nervous system: Somnolent Extremities: No edema Skin: No significant lesions noted Psychiatry: Cannot be assessed    Data Reviewed: I have personally reviewed following labs and imaging studies  CBC: Recent Labs  Lab 06/23/20 0159 06/24/20 0438 06/25/20 0130 06/25/20 0401 06/25/20 1939 06/26/20 0147 06/27/20 0145  WBC 8.0 8.9 5.6  --   --  6.8 7.0  NEUTROABS  --   --  4.1  --   --  5.1  --   HGB 7.1* 7.2* 6.3* 6.2* 7.5* 7.8* 7.9*  HCT 22.2* 23.3* 19.9* 20.0* 24.4* 25.2* 24.4*  MCV 88.8 88.9 88.8  --   --  88.7 86.8  PLT 142* 150 145*  --   --  138* 326   Basic Metabolic Panel: Recent Labs  Lab 06/21/20 0840 06/22/20 0119 06/23/20 0159 06/23/20 0526 06/24/20 0438 06/25/20 0130 06/26/20 0147 06/27/20 0145  NA 139 140 139  --  135 136 138 141  K 5.0 4.7 6.6* 5.0 4.7 4.8 5.0 4.6  CL 104 102 103  --  98 99 104 104  CO2 24 29 25   --  28 25 24 29   GLUCOSE 201* 184* 144*  --  170* 206* 199* 170*  BUN 40* 23 43*  --  24* 40* 53* 35*  CREATININE 5.28* 3.51* 4.83*  --  3.20* 4.61* 5.39* 3.94*  CALCIUM 8.3* 8.8* 8.9  --  8.9 8.7* 8.8*  8.8*  MG  --  2.0 2.0  --  1.9  --   --  1.8  PHOS 4.6  --  5.6*  --   --   --   --   --    GFR: Estimated Creatinine Clearance: 19.7 mL/min (A) (by C-G formula based on SCr of 3.94 mg/dL (H)). Liver Function Tests: Recent Labs  Lab 06/21/20 0840  ALBUMIN 2.9*   No results for input(s): LIPASE, AMYLASE in the last 168 hours. No results for input(s): AMMONIA in the last 168 hours. Coagulation Profile: No results for input(s): INR, PROTIME in the last 168 hours. Cardiac Enzymes: No results for input(s): CKTOTAL, CKMB, CKMBINDEX, TROPONINI in the last 168 hours. BNP (last 3 results) No results for input(s): PROBNP in the last 8760 hours. HbA1C: No results for input(s): HGBA1C in the last 72 hours. CBG: Recent Labs  Lab 06/26/20 1340 06/26/20 1702 06/26/20 2108 06/27/20 0611 06/27/20 1128  GLUCAP 198* 244* 171* 160* 172*   Lipid Profile: No results for input(s): CHOL, HDL, LDLCALC, TRIG, CHOLHDL, LDLDIRECT  in the last 72 hours. Thyroid Function Tests: No results for input(s): TSH, T4TOTAL, FREET4, T3FREE, THYROIDAB in the last 72 hours. Anemia Panel: No results for input(s): VITAMINB12, FOLATE, FERRITIN, TIBC, IRON, RETICCTPCT in the last 72 hours. Sepsis Labs: No results for input(s): PROCALCITON, LATICACIDVEN in the last 168 hours.  No results found for this or any previous visit (from the past 240 hour(s)).       Radiology Studies: US PELVIS LIMITED (TRANSABDOMINAL ONLY)  Result Date: 06/25/2020 CLINICAL DATA:  Evaluate for retained clot in urinary bladder. Gross hematuria. EXAM: URINARY TRACT ULTRASOUND COMPLETE COMPARISON:  Ultrasound pelvis 06/16/2020 FINDINGS: Urinary bladder: There is a 7.1 x 4.1 x 3 cm (68mL) heterogeneous avascular hyperechoic foci within the urinary bladder lumen that is consistent with a known clot. IMPRESSION: 1. Several foci of gas associated with the urinary bladder. Unclear if finding is within the urinary bladder lumen versus within the  urinary bladder wall. If within the wall finding is concerning for emphysematous cystitis. If within the lumen, finding could be related recent instrumentation versus infection. Correlate clinically. 2. Persistent 7.1 cm avascular heterogeneous hyperechoic foci within urinary bladder lumen consistent with known clot. These results will be called to the ordering clinician or representative by the Radiologist Assistant, and communication documented in the PACS or [employer]. Electronically Signed   By: Iven Finn M.D.   On: 06/25/2020 20:10        Scheduled Meds: . sodium chloride   Intravenous Once  . allopurinol  200 mg Oral Daily  . calcitRIOL  0.5 mcg Oral Daily  . carvedilol  12.5 mg Oral BID WC  . chlorhexidine  15 mL Mouth Rinse Daily  . Chlorhexidine Gluconate Cloth  6 each Topical Daily  . Chlorhexidine Gluconate Cloth  6 each Topical Q0600  . [START ON 06/28/2020] darbepoetin (ARANESP) injection - DIALYSIS  200 mcg Intravenous Q Wed-HD  . docusate sodium  100 mg Oral BID  . feeding supplement  237 mL Oral Q24H  . insulin aspart  0-6 Units Subcutaneous TID WC  . levothyroxine  112 mcg Oral QAC breakfast  . losartan  100 mg Oral Daily  . multivitamin  1 tablet Oral QHS  . pantoprazole  40 mg Oral Daily  . pregabalin  150 mg Oral BID  . rosuvastatin  10 mg Oral Daily  . tamsulosin  0.4 mg Oral Daily   Continuous Infusions: . [START ON 06/28/2020]  ceFAZolin (ANCEF) IV    . sodium chloride irrigation 3,000 mL (06/21/20 2233)     LOS: 11 days    Time spent: 35 minutes    Pratik D Manuella Ghazi, DO [facility]  If 7PM-7AM, please contact night-coverage [email or web address] 06/27/2020, 11:41 AM

## 2020-06-27 NOTE — Progress Notes (Signed)
[facility] Progress Note  Subjective: seen in room, says that urology is planning surgery for tomorrow.   Vitals:   06/26/20 2348 06/27/20 0401 06/27/20 0817 06/27/20 1126  BP: (!) 159/68 (!) 160/70 (!) 144/73 132/63  Pulse: 85 86 78 74  Resp: 14 13 20 19   Temp: 98.1 F (36.7 C) 98.5 F (36.9 C) 98 F (36.7 C) 97.6 F (36.4 C)  TempSrc: Oral Oral Oral Oral  SpO2: 97% 99% 100% 98%  Weight:  90.1 kg    Height:        Exam:   alert, nad   no jvd  Chest cta bilat  Cor reg no RG  Abd soft ntnd no ascites   Ext no LE edema   Alert, NF, ox3   L AVF +bruit    OP HD:  HomeHD NxStage MTWF EDW 85kg No heparin  - last esa was mircera 150 ug in Nov 2021, none since  Assessment/Plan: 1. Gross hematuria/ radiation cystitis - hx prostate ca s/p radiation therapy. Urology following -s/pcystoscopy/clot removalin OR 3/12. Pt has had cystoscopy, clot evacuation, cauterization, alum irrigation but still bleeding. Back to OR tomorrow per urology.  2. ESRD - Home HD. Continue MWF schedule while here. HD tomorrow in am prior to surgery in the afternoon. No heparin w/ HD.  3. Hypertension/volume - BPsl elevated. Continue home meds - Coreg, Losartan, hydralazine. 4-5kg up by wts, UF 3 L w/ HD tomorrow.   4. Anemia - not on ESA as outpatient. Restarted darbepoetin 200 mcg q Wednesday here. Transfuse as needed. SP total 4u pRBCs this admit. 5. Metabolic bone disease - OPCa/Phos at goal. Continue home calcitriol. 6. DM: per primary 7. H/o osteomyelitis   Rob Schertz 06/27/2020, 1:18 PM   Recent Labs  Lab 06/21/20 0840 06/22/20 0119 06/23/20 0159 06/23/20 0526 06/26/20 0147 06/27/20 0145  K 5.0   < > 6.6*   < > 5.0 4.6  BUN 40*   < > 43*   < > 53* 35*  CREATININE 5.28*   < > 4.83*   < > 5.39* 3.94*  CALCIUM 8.3*   < > 8.9   < > 8.8* 8.8*  PHOS 4.6  --  5.6*  --   --   --   HGB 7.2*   < > 7.1*   < > 7.8* 7.9*   < > = values in this interval not displayed.    Inpatient medications: . sodium chloride   Intravenous Once  . allopurinol  200 mg Oral Daily  . calcitRIOL  0.5 mcg Oral Daily  . carvedilol  12.5 mg Oral BID WC  . chlorhexidine  15 mL Mouth Rinse Daily  . Chlorhexidine Gluconate Cloth  6 each Topical Daily  . Chlorhexidine Gluconate Cloth  6 each Topical Q0600  . [START ON 06/28/2020] darbepoetin (ARANESP) injection - DIALYSIS  200 mcg Intravenous Q Wed-HD  . docusate sodium  100 mg Oral BID  . feeding supplement  237 mL Oral Q24H  . insulin aspart  0-6 Units Subcutaneous TID WC  . levothyroxine  112 mcg Oral QAC breakfast  . losartan  100 mg Oral Daily  . multivitamin  1 tablet Oral QHS  . pantoprazole  40 mg Oral Daily  . pregabalin  150 mg Oral BID  . rosuvastatin  10 mg Oral Daily  . tamsulosin  0.4 mg Oral Daily   . [START ON 06/28/2020]  ceFAZolin (ANCEF) IV    . sodium chloride irrigation  3,000 mL (06/21/20 2233)   acetaminophen **OR** acetaminophen, calcium carbonate (dosed in mg elemental calcium), camphor-menthol **AND** hydrOXYzine, docusate sodium, hydrALAZINE, morphine injection, ondansetron **OR** ondansetron (ZOFRAN) IV, oxyCODONE-acetaminophen, polyvinyl alcohol, sorbitol, zolpidem

## 2020-06-27 NOTE — Progress Notes (Signed)
Pharmacy Antibiotic Note  [name] is a 70 y.o. male admitted on 06/16/2020 with hematuria. .  Pharmacy has been consulted for prophylactic dosing of ancef prior to procedure in OR 3/23.  Patient is ESRD on MWF schedule.   Patient scheduled for OR 3/23 ~1515, will order prophylactic dose for that time and follow up dialysis plans in am. If patient to receive HD prior will adjust dosing so that it is received at end of HD.    Plan: Ancef 2g IV prior to procedure 3/23  Height: 5\' 10"  (177.8 cm) Weight: 90.1 kg (198 lb 11.2 oz) IBW/kg (Calculated) : 73  Temp (24hrs), Avg:98.4 F (36.9 C), Min:98 F (36.7 C), Max:98.9 F (37.2 C)  Recent Labs  Lab 06/23/20 0159 06/24/20 0438 06/25/20 0130 06/26/20 0147 06/27/20 0145  WBC 8.0 8.9 5.6 6.8 7.0  CREATININE 4.83* 3.20* 4.61* 5.39* 3.94*    Estimated Creatinine Clearance: 19.7 mL/min (A) (by C-G formula based on SCr of 3.94 mg/dL (H)).    Allergies  Allergen Reactions  . Mushroom Extract Complex Nausea Only     Thank you for allowing pharmacy to be a part of this patient's care.  [name] Hearing PharmD., BCPS Clinical Pharmacist 06/27/2020 9:30 AM

## 2020-06-27 NOTE — Progress Notes (Signed)
10 Days Post-Op Subjective: Patient reports that he still has blood in his urine.  Ultrasound yesterday revealed a clot in his bladder.  He is somewhat lethargic this morning as he had both Valium and Ambien last night.  Objective: Vital signs in last 24 hours: Temp:  [98 F (36.7 C)-98.9 F (37.2 C)] 98.5 F (36.9 C) (03/22 0401) Pulse Rate:  [75-87] 86 (03/22 0401) Resp:  [11-17] 13 (03/22 0401) BP: (114-160)/(47-82) 160/70 (03/22 0401) SpO2:  [97 %-100 %] 99 % (03/22 0401) Weight:  [90.1 kg-94.6 kg] 90.1 kg (03/22 0401)  Intake/Output from previous day: 03/21 0701 - 03/22 0700 In: 150 [P.O.:150] Out: 2350 [Urine:350] Intake/Output this shift: No intake/output data recorded.  Physical Exam:  Constitutional: Vital signs reviewed. WD WN in NAD   Eyes: PERRL, No scleral icterus.   Cardiovascular: RRR Pulmonary/Chest: Normal effort    Lab Results: Recent Labs    06/25/20 1939 06/26/20 0147 06/27/20 0145  HGB 7.5* 7.8* 7.9*  HCT 24.4* 25.2* 24.4*   BMET Recent Labs    06/26/20 0147 06/27/20 0145  NA 138 141  K 5.0 4.6  CL 104 104  CO2 24 29  GLUCOSE 199* 170*  BUN 53* 35*  CREATININE 5.39* 3.94*  CALCIUM 8.8* 8.8*   No results for input(s): LABPT, INR in the last 72 hours. No results for input(s): LABURIN in the last 72 hours. Results for orders placed or performed during the hospital encounter of 06/16/20  SARS CORONAVIRUS 2 (TAT 6-24 HRS) Nasopharyngeal Nasopharyngeal Swab     Status: None   Collection Time: 06/16/20  7:12 PM   Specimen: Nasopharyngeal Swab  Result Value Ref Range Status   SARS Coronavirus 2 NEGATIVE NEGATIVE Final    Comment: (NOTE) SARS-CoV-2 target nucleic acids are NOT DETECTED.  The SARS-CoV-2 RNA is generally detectable in upper and lower respiratory specimens during the acute phase of infection. Negative results do not preclude SARS-CoV-2 infection, do not rule out co-infections with other pathogens, and should not be used as  the sole basis for treatment or other patient management decisions. Negative results must be combined with clinical observations, patient history, and epidemiological information. The expected result is Negative.  Fact Sheet for Patients: SugarRoll.be  Fact Sheet for Healthcare Providers: https://www.woods-mathews.com/  This test is not yet approved or cleared by the [place] FDA and  has been authorized for detection and/or diagnosis of SARS-CoV-2 by FDA under an Emergency Use Authorization (EUA). This EUA will remain  in effect (meaning this test can be used) for the duration of the COVID-19 declaration under Se ction 564(b)(1) of the Act, 21 U.S.C. section 360bbb-3(b)(1), unless the authorization is terminated or revoked sooner.  Performed at [facility], [place] [address], [place], [place] [address]     Studies/Results: US PELVIS LIMITED (TRANSABDOMINAL ONLY)  Result Date: 06/25/2020 CLINICAL DATA:  Evaluate for retained clot in urinary bladder. Gross hematuria. EXAM: URINARY TRACT ULTRASOUND COMPLETE COMPARISON:  Ultrasound pelvis 06/16/2020 FINDINGS: Urinary bladder: There is a 7.1 x 4.1 x 3 cm (28mL) heterogeneous avascular hyperechoic foci within the urinary bladder lumen that is consistent with a known clot. IMPRESSION: 1. Several foci of gas associated with the urinary bladder. Unclear if finding is within the urinary bladder lumen versus within the urinary bladder wall. If within the wall finding is concerning for emphysematous cystitis. If within the lumen, finding could be related recent instrumentation versus infection. Correlate clinically. 2. Persistent 7.1 cm avascular heterogeneous hyperechoic foci within urinary  bladder lumen consistent with known clot. These results will be called to the ordering clinician or representative by the Radiologist Assistant, and communication documented in the PACS or [employer].  Electronically Signed   By: Iven Finn M.D.   On: 06/25/2020 20:10   I reviewed ultrasound images.  There is a small-46 cc clot in the bladder. Assessment/Plan:   Persistent gross hematuria from radiation cystitis.  Unfortunately, this has been resistant to treatment, both cystoscopy, clot evacuation, cauterization, alum irrigation.  I think that he needs to go back to the OR in a nonurgent basis for repeat cauterization.  At this point, I do not think he needs a catheter.    I would suggest that we proceed tomorrow, perhaps after dialysis which can hopefully be done in the morning.  I have discussed this with the patient.  As he was somewhat lethargic, I did write this down for him.    We will look to doing this in the afternoon tomorrow.  If this fails, he will probably eventually need eventual formalin placement in the bladder.  I do not think he is a candidate for cystectomy.   LOS: 11 days   Jorja Loa 06/27/2020, 8:01 AM

## 2020-06-28 ENCOUNTER — Inpatient Hospital Stay (HOSPITAL_COMMUNITY): Payer: Medicare Other | Admitting: Anesthesiology

## 2020-06-28 ENCOUNTER — Encounter (HOSPITAL_COMMUNITY): Payer: Self-pay | Admitting: Internal Medicine

## 2020-06-28 ENCOUNTER — Encounter (HOSPITAL_COMMUNITY)
Admission: EM | Disposition: A | Discharge status: Home / Self Care | Payer: Self-pay | Source: Home / Self Care | Attending: Internal Medicine

## 2020-06-28 HISTORY — PX: CYSTOSCOPY WITH FULGERATION: SHX6638

## 2020-06-28 HISTORY — PX: TRANSURETHRAL RESECTION OF PROSTATE: SHX73

## 2020-06-28 LAB — POCT I-STAT, CHEM 8
BUN: 21 mg/dL (ref 8–23)
Calcium, Ion: 1.09 mmol/L — ABNORMAL LOW (ref 1.15–1.40)
Chloride: 100 mmol/L (ref 98–111)
Creatinine, Ser: 2.8 mg/dL — ABNORMAL HIGH (ref 0.61–1.24)
Glucose, Bld: 139 mg/dL — ABNORMAL HIGH (ref 70–99)
HCT: 28 % — ABNORMAL LOW (ref 39.0–52.0)
Hemoglobin: 9.5 g/dL — ABNORMAL LOW (ref 13.0–17.0)
Potassium: 4.3 mmol/L (ref 3.5–5.1)
Sodium: 138 mmol/L (ref 135–145)
TCO2: 29 mmol/L (ref 22–32)

## 2020-06-28 LAB — BASIC METABOLIC PANEL
Anion gap: 9 (ref 5–15)
BUN: 51 mg/dL — ABNORMAL HIGH (ref 8–23)
CO2: 26 mmol/L (ref 22–32)
Calcium: 9.1 mg/dL (ref 8.9–10.3)
Chloride: 104 mmol/L (ref 98–111)
Creatinine, Ser: 4.83 mg/dL — ABNORMAL HIGH (ref 0.61–1.24)
GFR, Estimated: 12 mL/min — ABNORMAL LOW (ref >60)
Glucose, Bld: 174 mg/dL — ABNORMAL HIGH (ref 70–99)
Potassium: 5.3 mmol/L — ABNORMAL HIGH (ref 3.5–5.1)
Sodium: 139 mmol/L (ref 135–145)

## 2020-06-28 LAB — GLUCOSE, CAPILLARY
Glucose-Capillary: 160 mg/dL — ABNORMAL HIGH (ref 70–99)
Glucose-Capillary: 120 mg/dL — ABNORMAL HIGH (ref 70–99)
Glucose-Capillary: 131 mg/dL — ABNORMAL HIGH (ref 70–99)
Glucose-Capillary: 120 mg/dL — ABNORMAL HIGH (ref 70–99)
Glucose-Capillary: 262 mg/dL — ABNORMAL HIGH (ref 70–99)

## 2020-06-28 LAB — CBC
HCT: 26.9 % — ABNORMAL LOW (ref 39.0–52.0)
Hemoglobin: 8.3 g/dL — ABNORMAL LOW (ref 13.0–17.0)
MCH: 27.2 pg (ref 26.0–34.0)
MCHC: 30.9 g/dL (ref 30.0–36.0)
MCV: 88.2 fL (ref 80.0–100.0)
Platelets: 156 10*3/uL (ref 150–400)
RBC: 3.05 MIL/uL — ABNORMAL LOW (ref 4.22–5.81)
RDW: 16.6 % — ABNORMAL HIGH (ref 11.5–15.5)
WBC: 7.6 10*3/uL (ref 4.0–10.5)
nRBC: 0 % (ref 0.0–0.2)

## 2020-06-28 LAB — MAGNESIUM: Magnesium: 2 mg/dL (ref 1.7–2.4)

## 2020-06-28 SURGERY — CYSTOSCOPY, WITH BLADDER FULGURATION
Anesthesia: General | Site: Prostate

## 2020-06-28 MED ORDER — PHENYLEPHRINE 40 MCG/ML (10ML) SYRINGE FOR IV PUSH (FOR BLOOD PRESSURE SUPPORT)
PREFILLED_SYRINGE | INTRAVENOUS | Status: AC
  Filled 2020-06-28: qty 20

## 2020-06-28 MED ORDER — FENTANYL CITRATE (PF) 100 MCG/2ML IJ SOLN
25.0000 ug | INTRAMUSCULAR | Status: DC | PRN
  Administered 2020-06-28: 50 ug via INTRAVENOUS

## 2020-06-28 MED ORDER — DEXAMETHASONE SODIUM PHOSPHATE 10 MG/ML IJ SOLN
INTRAMUSCULAR | Status: AC
  Filled 2020-06-28: qty 1

## 2020-06-28 MED ORDER — SODIUM CHLORIDE 0.9 % IV SOLN: INTRAVENOUS | Status: DC

## 2020-06-28 MED ORDER — PROPOFOL 10 MG/ML IV BOLUS
INTRAVENOUS | Status: AC
  Filled 2020-06-28: qty 20

## 2020-06-28 MED ORDER — DARBEPOETIN ALFA 200 MCG/0.4ML IJ SOSY
PREFILLED_SYRINGE | INTRAMUSCULAR | Status: AC
  Administered 2020-06-28: 200 ug via INTRAVENOUS
  Filled 2020-06-28: qty 0.4

## 2020-06-28 MED ORDER — FENTANYL CITRATE (PF) 250 MCG/5ML IJ SOLN
INTRAMUSCULAR | Status: AC
  Filled 2020-06-28: qty 5

## 2020-06-28 MED ORDER — ONDANSETRON HCL 4 MG/2ML IJ SOLN
INTRAMUSCULAR | Status: AC
  Filled 2020-06-28: qty 2

## 2020-06-28 MED ORDER — LIDOCAINE 2% (20 MG/ML) 5 ML SYRINGE
INTRAMUSCULAR | Status: DC | PRN
  Administered 2020-06-28: 60 mg via INTRAVENOUS

## 2020-06-28 MED ORDER — FENTANYL CITRATE (PF) 100 MCG/2ML IJ SOLN
INTRAMUSCULAR | Status: DC | PRN
  Administered 2020-06-28 (×3): 25 ug via INTRAVENOUS

## 2020-06-28 MED ORDER — ONDANSETRON HCL 4 MG/2ML IJ SOLN
INTRAMUSCULAR | Status: DC | PRN
  Administered 2020-06-28: 4 mg via INTRAVENOUS

## 2020-06-28 MED ORDER — PROPOFOL 10 MG/ML IV BOLUS
INTRAVENOUS | Status: DC | PRN
  Administered 2020-06-28: 150 mg via INTRAVENOUS

## 2020-06-28 MED ORDER — OXYCODONE-ACETAMINOPHEN 5-325 MG PO TABS
1.0000 | ORAL_TABLET | ORAL | Status: DC | PRN
  Administered 2020-06-28: 1 via ORAL
  Filled 2020-06-28: qty 1

## 2020-06-28 MED ORDER — CEFAZOLIN SODIUM-DEXTROSE 2-4 GM/100ML-% IV SOLN
2.0000 g | INTRAVENOUS | Status: AC
  Administered 2020-06-28: 2 g via INTRAVENOUS
  Filled 2020-06-28: qty 100

## 2020-06-28 MED ORDER — PHENYLEPHRINE 40 MCG/ML (10ML) SYRINGE FOR IV PUSH (FOR BLOOD PRESSURE SUPPORT)
PREFILLED_SYRINGE | INTRAVENOUS | Status: DC | PRN
  Administered 2020-06-28: 120 ug via INTRAVENOUS
  Administered 2020-06-28 (×3): 80 ug via INTRAVENOUS
  Administered 2020-06-28: 40 ug via INTRAVENOUS

## 2020-06-28 MED ORDER — FENTANYL CITRATE (PF) 100 MCG/2ML IJ SOLN
INTRAMUSCULAR | Status: AC
  Administered 2020-06-28: 50 ug via INTRAVENOUS
  Filled 2020-06-28: qty 2

## 2020-06-28 MED ORDER — CHLORHEXIDINE GLUCONATE 0.12 % MT SOLN
15.0000 mL | OROMUCOSAL | Status: AC
  Administered 2020-06-28: 15 mL via OROMUCOSAL

## 2020-06-28 MED ORDER — HYDROMORPHONE HCL 1 MG/ML IJ SOLN
1.0000 mg | INTRAMUSCULAR | Status: DC | PRN
Start: 2020-06-28 — End: 2020-07-01
  Administered 2020-06-28: 1 mg via INTRAVENOUS
  Filled 2020-06-28 (×2): qty 1

## 2020-06-28 MED ORDER — HYDROMORPHONE HCL 1 MG/ML IJ SOLN
0.5000 mg | INTRAMUSCULAR | Status: DC | PRN
  Administered 2020-06-28: 0.5 mg via INTRAVENOUS
  Filled 2020-06-28: qty 1

## 2020-06-28 MED ORDER — LIDOCAINE 2% (20 MG/ML) 5 ML SYRINGE
INTRAMUSCULAR | Status: AC
  Filled 2020-06-28: qty 5

## 2020-06-28 MED ORDER — SODIUM CHLORIDE 0.9 % IR SOLN
Status: DC | PRN
  Administered 2020-06-28 (×2): 3000 mL

## 2020-06-28 MED ORDER — DEXAMETHASONE SODIUM PHOSPHATE 4 MG/ML IJ SOLN
INTRAMUSCULAR | Status: DC | PRN
  Administered 2020-06-28: 4 mg via INTRAVENOUS

## 2020-06-28 MED ORDER — LIDOCAINE HCL URETHRAL/MUCOSAL 2 % EX GEL
CUTANEOUS | Status: AC
  Filled 2020-06-28: qty 11

## 2020-06-28 MED ORDER — EPHEDRINE 5 MG/ML INJ
INTRAVENOUS | Status: AC
  Filled 2020-06-28: qty 10

## 2020-06-28 SURGICAL SUPPLY — 37 items
BAG URO CATCHER STRL LF (MISCELLANEOUS) ×3 IMPLANT
BASKET LASER NITINOL 1.9FR (BASKET) IMPLANT
BASKET STNLS GEMINI 4WIRE 3FR (BASKET) IMPLANT
BASKET ZERO TIP NITINOL 2.4FR (BASKET) IMPLANT
CATH FOLEY 2W COUNCIL 20FR 5CC (CATHETERS) IMPLANT
CATH FOLEY 2WAY SLVR 30CC 20FR (CATHETERS) ×1 IMPLANT
CATH INTERMIT  6FR 70CM (CATHETERS) IMPLANT
CATH URET 5FR 28IN CONE TIP (BALLOONS) ×1
CATH URET 5FR 28IN OPEN ENDED (CATHETERS) ×3 IMPLANT
CATH URET 5FR 70CM CONE TIP (BALLOONS) ×2 IMPLANT
CATH URET DUAL LUMEN 6-10FR 50 (CATHETERS) ×3 IMPLANT
COVER WAND RF STERILE (DRAPES) ×2 IMPLANT
ELECT REM PT RETURN 9FT ADLT (ELECTROSURGICAL)
ELECTRODE REM PT RTRN 9FT ADLT (ELECTROSURGICAL) IMPLANT
GLOVE BIO SURGEON STRL SZ7.5 (GLOVE) ×3 IMPLANT
GOWN STRL REUS W/ TWL LRG LVL3 (GOWN DISPOSABLE) ×2 IMPLANT
GOWN STRL REUS W/ TWL XL LVL3 (GOWN DISPOSABLE) ×2 IMPLANT
GOWN STRL REUS W/TWL LRG LVL3 (GOWN DISPOSABLE) ×1
GOWN STRL REUS W/TWL XL LVL3 (GOWN DISPOSABLE) ×1
GUIDEWIRE ANG ZIPWIRE 038X150 (WIRE) IMPLANT
GUIDEWIRE STR DUAL SENSOR (WIRE) IMPLANT
IV NS IRRIG 3000ML ARTHROMATIC (IV SOLUTION) ×3 IMPLANT
KIT BALLN UROMAX 15FX4 (MISCELLANEOUS) ×2 IMPLANT
KIT BALLN UROMAX 26 75X4 (MISCELLANEOUS) ×1
KIT TURNOVER KIT B (KITS) ×3 IMPLANT
LOOP CUT BIPOLAR 24F LRG (ELECTROSURGICAL) ×1 IMPLANT
MANIFOLD NEPTUNE II (INSTRUMENTS) ×3 IMPLANT
NS IRRIG 1000ML POUR BTL (IV SOLUTION) ×3 IMPLANT
PACK CYSTO (CUSTOM PROCEDURE TRAY) ×3 IMPLANT
SET IRRIG Y TYPE TUR BLADDER L (SET/KITS/TRAYS/PACK) IMPLANT
SHEATH URETERAL 12FRX35CM (MISCELLANEOUS) ×3 IMPLANT
SOL PREP POV-IOD 4OZ 10% (MISCELLANEOUS) ×3 IMPLANT
SYR CONTROL 10ML LL (SYRINGE) ×1 IMPLANT
SYR TOOMEY IRRIG 70ML (MISCELLANEOUS) ×3
SYRINGE TOOMEY IRRIG 70ML (MISCELLANEOUS) IMPLANT
TUBE CONNECTING 12X1/4 (SUCTIONS) IMPLANT
WATER STERILE IRR 3000ML UROMA (IV SOLUTION) ×3 IMPLANT

## 2020-06-28 NOTE — Anesthesia Postprocedure Evaluation (Signed)
Anesthesia Post Note  Patient: [name]  Procedure(s) Performed: CYSTOSCOPY WITH CLOT EVACUATION AND FULGERATION OF BLEEDERS (N/A Bladder) TRANSURETHRAL RESECTION OF THE PROSTATE (TURP) (N/A Prostate)     Patient location during evaluation: PACU Anesthesia Type: General Level of consciousness: awake Pain management: pain level controlled Vital Signs Assessment: post-procedure vital signs reviewed and stable Respiratory status: spontaneous breathing, nonlabored ventilation, respiratory function stable and patient connected to nasal cannula oxygen Cardiovascular status: blood pressure returned to baseline and stable Postop Assessment: no apparent nausea or vomiting Anesthetic complications: no   No complications documented.  Last Vitals:  Vitals:   06/28/20 1820 06/28/20 1928  BP: (!) 164/77 (!) 145/99  Pulse: 82 82  Resp: 16 18  Temp: 36.6 C 36.5 C  SpO2: 98% 99%    Last Pain:  Vitals:   06/28/20 1928  TempSrc: Oral  PainSc:                  Aitana Burry P Nyasha Rahilly

## 2020-06-28 NOTE — Anesthesia Procedure Notes (Signed)
Procedure Name: LMA Insertion Date/Time: 06/28/2020 4:33 PM Performed by: Candis Shine, CRNA Pre-anesthesia Checklist: Patient identified, Emergency Drugs available, Suction available and Patient being monitored Patient Re-evaluated:Patient Re-evaluated prior to induction Oxygen Delivery Method: Circle System Utilized Preoxygenation: Pre-oxygenation with 100% oxygen Induction Type: IV induction LMA: LMA inserted LMA Size: 5.0 Number of attempts: 1 Placement Confirmation: positive ETCO2 Tube secured with: Tape Dental Injury: Teeth and Oropharynx as per pre-operative assessment

## 2020-06-28 NOTE — Anesthesia Preprocedure Evaluation (Addendum)
Anesthesia Evaluation  Patient identified by MRN, date of birth, ID band Patient awake    Reviewed: Allergy & Precautions, H&P , NPO status , Patient's Chart, lab work & pertinent test results  History of Anesthesia Complications (+) PONV and history of anesthetic complications  Airway Mallampati: I       Dental no notable dental hx.    Pulmonary neg pulmonary ROS, former smoker,    Pulmonary exam normal        Cardiovascular hypertension, Pt. on medications and Pt. on home beta blockers + CAD, + CABG and + Peripheral Vascular Disease  Normal cardiovascular exam     Neuro/Psych negative neurological ROS  negative psych ROS   GI/Hepatic Neg liver ROS, GERD  Medicated,  Endo/Other  diabetesHypothyroidism   Renal/GU CRF and DialysisRenal disease  negative genitourinary   Musculoskeletal  (+) Arthritis , Osteoarthritis,    Abdominal Normal abdominal exam  (+)   Peds  Hematology negative hematology ROS (+)   Anesthesia Other Findings   Reproductive/Obstetrics negative OB ROS                           Anesthesia Physical  Anesthesia Plan  ASA: III  Anesthesia Plan: General   Post-op Pain Management:    Induction: Intravenous  PONV Risk Score and Plan: 4 or greater and Ondansetron, Dexamethasone and Treatment may vary due to age or medical condition  Airway Management Planned: LMA  Additional Equipment:   Intra-op Plan:   Post-operative Plan: Extubation in OR  Informed Consent:   Plan Discussed with: Anesthesiologist  Anesthesia Plan Comments:         Anesthesia Quick Evaluation

## 2020-06-28 NOTE — Progress Notes (Signed)
[facility] Progress Note  Subjective: seen on HD, no new /co  Vitals:   06/28/20 1030 06/28/20 1100 06/28/20 1126 06/28/20 1200  BP: 105/65 102/65 129/70 132/74  Pulse:  88 83 83  Resp:   12   Temp:   (!) 97.5 F (36.4 C) 97.7 F (36.5 C)  TempSrc:   Oral Oral  SpO2:   98% 100%  Weight:   88 kg   Height:        Exam:   alert, nad   no jvd  Chest cta bilat  Cor reg no RG  Abd soft ntnd no ascites   Ext no LE edema   Alert, NF, ox3   L AVF +bruit    OP HD:  HomeHD NxStage MTWF EDW 85kg No heparin  - last esa was mircera 150 ug in Nov 2021, none since  Assessment/Plan: 1. Gross hematuria/ radiation cystitis - hx prostate ca s/p radiation therapy. Urology following -s/pcystoscopy/clot removalin OR 3/12. Pt has had cystoscopy, clot evacuation, cauterization, alum irrigation but still bleeding. Per urology.  2. ESRD - Home HD. Continue MWF schedule while here. HD today this am. No heparin w/ HD.  3. Hypertension/volume - BPsl elevated. Continue home meds - Coreg, Losartan, hydralazine. Max UF 3 L w/ HD today and again Friday 4. Anemia - not on ESA as outpatient. Restarted darbepoetin 200 mcg q Wednesday here (rec'd 3/16, 3/23). Transfuse as needed. SP total 4u pRBCs this admit. 5. Metabolic bone disease - OPCa/Phos at goal. Continue home calcitriol. 6. DM: per primary 7. H/o osteomyelitis   Rob Rondarius Kadrmas 06/28/2020, 2:18 PM   Recent Labs  Lab 06/23/20 0159 06/23/20 0526 06/27/20 0145 06/28/20 0131  K 6.6*   < > 4.6 5.3*  BUN 43*   < > 35* 51*  CREATININE 4.83*   < > 3.94* 4.83*  CALCIUM 8.9   < > 8.8* 9.1  PHOS 5.6*  --   --   --   HGB 7.1*   < > 7.9* 8.3*   < > = values in this interval not displayed.   Inpatient medications: . sodium chloride   Intravenous Once  . allopurinol  200 mg Oral Daily  . calcitRIOL  0.5 mcg Oral Daily  . carvedilol  12.5 mg Oral BID WC  . chlorhexidine  15 mL Mouth Rinse Daily  . Chlorhexidine  Gluconate Cloth  6 each Topical Daily  . Chlorhexidine Gluconate Cloth  6 each Topical Q0600  . darbepoetin (ARANESP) injection - DIALYSIS  200 mcg Intravenous Q Wed-HD  . docusate sodium  100 mg Oral BID  . feeding supplement  237 mL Oral Q24H  . insulin aspart  0-6 Units Subcutaneous TID WC  . levothyroxine  112 mcg Oral QAC breakfast  . losartan  100 mg Oral Daily  . multivitamin  1 tablet Oral QHS  . pantoprazole  40 mg Oral Daily  . pregabalin  150 mg Oral BID  . rosuvastatin  10 mg Oral Daily  . tamsulosin  0.4 mg Oral Daily   . sodium chloride irrigation 3,000 mL (06/21/20 2233)   acetaminophen **OR** acetaminophen, calcium carbonate (dosed in mg elemental calcium), camphor-menthol **AND** hydrOXYzine, docusate sodium, hydrALAZINE, morphine injection, ondansetron **OR** ondansetron (ZOFRAN) IV, oxyCODONE-acetaminophen, polyvinyl alcohol, sorbitol, zolpidem

## 2020-06-28 NOTE — Progress Notes (Signed)
PROGRESS NOTE    [name]  YBO:[id number] DOB: 1951/04/02 DOA: 06/16/2020 PCP: Lorenda Hatchet, FNP   Chief Complaint  Patient presents with  . Hematuria  Brief Narrative: 70 year old male with history of prostate cancer hypothyroidism, hypertension, hyperlipidemia, diabetes mellitus ESRD on HD presented with hematuria for a few days.  He was admitted seen by urology recently as outpatient but hematuria had gotten worse so was brought to the hospital.  He underwent Foley catheter placement and three-way irrigation.  Patient continued to have large hematuria with clots underwent cystoscopic evaluation with fulguration on 06/17/2020, hematuria persisted needing blood transfusion and continued on CBI.  Urology following closely.  Subjective: Seen/examined in HD, aaox3, on ra, weak, frail appearing Reports he has not eaten anything for cystoscopy after HD  Assessment & Plan:  Gross hematuria: Suspecting due to radiation cystitis followed by urology was on CBI underwent cystoscopy with fulguration 3/12 with ongoing hematuria planning for cystoscopy postdialysis today.  Foley catheter discontinued 3/19.  Monitor PVR.  Monitor H&H.  History of prostate cancer status post radiation in 2018  ESRD on HD MWF, dialysis this morning per nephrology.  Appreciate input.  Hypertension/volume blood pressure is elevated continue current Coreg losartan hydralazine ultrafiltrate 3 L with HD today per nephrology and again on Friday.  Anemia of due to acute blood loss from hematuria and chronic renal disease not on ESA as an outpatient placed on darbepoetin qwednesday.  s/p 4 unit PRBC this admission.  Monitor. Recent Labs  Lab 06/25/20 0401 06/25/20 1939 06/26/20 0147 06/27/20 0145 06/28/20 0131  HGB 6.2* 7.5* 7.8* 7.9* 8.3*  HCT 20.0* 24.4* 25.2* 27.7* 82.4*   Metabolic bone disease continue PhosLo and calcitriol.  Calcium at goal.  Diabetes mellitus, controlled hemoglobin A1c 5.7, blood sugar  stable.  Continue sliding scale insulin. Recent Labs  Lab 06/27/20 1128 06/27/20 1606 06/27/20 2116 06/28/20 0604 06/28/20 1200  GLUCAP 172* 129* 197* 160* 120*   Hyperkalemia at 5.3.  Adjusted by nephrology today.  Hypercholesteremia: Continue his Crestor  Hypertension blood pressure is stable on Coreg Cozaar.  Hypothyroidism TSH stable 4.8 continue home Synthroid  Overweight with BMI 27.8.  Diet Order            Diet NPO time specified  Diet effective midnight                 Nutrition Problem: Increased nutrient needs Etiology: chronic illness (ESRD on HD) Signs/Symptoms: estimated needs Interventions: MVI,Prostat,Nepro shake Patient's Body mass index is 29.86 kg/m.  DVT prophylaxis: SCDs Start: 06/16/20 1734 Code Status:   Code Status: Full Code  Family Communication: plan of care discussed with patient at bedside.  Status is: Inpatient  Remains inpatient appropriate because:Inpatient level of care appropriate due to severity of illness   Dispo: The patient is from: Home with wife              Anticipated d/c is to: Home              Patient currently is not medically stable to d/c.   Difficult to place patient No  Unresulted Labs (From admission, onward)          Start     Ordered   06/26/20 2353  Basic metabolic panel  Daily,   R     Question:  Specimen collection method  Answer:  Lab=Lab collect   06/25/20 0743   Signed and Held  Renal function panel  Once,   R  Question:  Specimen collection method  Answer:  Lab=Lab collect   Signed and Held   Signed and Held  CBC  Once,   R       Question:  Specimen collection method  Answer:  Lab=Lab collect   Signed and Held        Medications reviewed:  Scheduled Meds: . Darbepoetin Alfa      . sodium chloride   Intravenous Once  . allopurinol  200 mg Oral Daily  . calcitRIOL  0.5 mcg Oral Daily  . carvedilol  12.5 mg Oral BID WC  . chlorhexidine  15 mL Mouth Rinse Daily  . Chlorhexidine  Gluconate Cloth  6 each Topical Daily  . Chlorhexidine Gluconate Cloth  6 each Topical Q0600  . darbepoetin (ARANESP) injection - DIALYSIS  200 mcg Intravenous Q Wed-HD  . docusate sodium  100 mg Oral BID  . feeding supplement  237 mL Oral Q24H  . insulin aspart  0-6 Units Subcutaneous TID WC  . levothyroxine  112 mcg Oral QAC breakfast  . losartan  100 mg Oral Daily  . multivitamin  1 tablet Oral QHS  . pantoprazole  40 mg Oral Daily  . pregabalin  150 mg Oral BID  . rosuvastatin  10 mg Oral Daily  . tamsulosin  0.4 mg Oral Daily   Continuous Infusions: .  ceFAZolin (ANCEF) IV    . sodium chloride irrigation 3,000 mL (06/21/20 2233)    Consultants:see note  Procedures:see note  Antimicrobials: Anti-infectives (From admission, onward)   Start     Dose/Rate Route Frequency Ordered Stop   06/28/20 1500  ceFAZolin (ANCEF) IVPB 2g/100 mL premix  Status:  Discontinued        2 g 200 mL/hr over 30 Minutes Intravenous To ShortStay Procedural 06/27/20 0928 06/28/20 0814   06/28/20 1115  ceFAZolin (ANCEF) IVPB 2g/100 mL premix        2 g 200 mL/hr over 30 Minutes Intravenous To ShortStay Procedural 06/28/20 0814 06/29/20 1115   06/16/20 1900  cefTRIAXone (ROCEPHIN) 1 g in sodium chloride 0.9 % 100 mL IVPB  Status:  Discontinued        1 g 200 mL/hr over 30 Minutes Intravenous Every 24 hours 06/16/20 1855 06/20/20 0606     Culture/Microbiology    Component Value Date/Time   SDES URINE, RANDOM 06/15/2020 1159   SPECREQUEST NONE 06/15/2020 1159   CULT  06/15/2020 1159    NO GROWTH Performed at [facility], [place] [address] [place], [place] [address]    REPTSTATUS 06/16/2020 FINAL 06/15/2020 1159    Other culture-see note  Objective: Vitals: Today's Vitals   06/28/20 0900 06/28/20 0930 06/28/20 1000 06/28/20 1030  BP: (!) 143/60 (!) 157/70 (!) 141/61 105/65  Pulse:  84    Resp:  16    Temp:      TempSrc:      SpO2:      Weight:      Height:      PainSc:         Intake/Output Summary (Last 24 hours) at 06/28/2020 1040 Last data filed at 06/28/2020 0354 Gross per 24 hour  Intake 140 ml  Output 431 ml  Net -291 ml   Filed Weights   06/27/20 0401 06/28/20 0534 06/28/20 0715  Weight: 90.1 kg 91.4 kg 94.4 kg   Weight change: -3.155 kg  Intake/Output from previous day: 03/22 0701 - 03/23 0700 In: 260 [P.O.:260] Out: 731 [Urine:730; Stool:1] Intake/Output this  shift: No intake/output data recorded. Filed Weights   06/27/20 0401 06/28/20 0534 06/28/20 0715  Weight: 90.1 kg 91.4 kg 94.4 kg    Examination: General exam: AAO, weak appearing, on RA HEENT:Oral mucosa moist, Ear/Nose WNL grossly,dentition normal. Respiratory system: bilaterally diminished,no use of accessory muscle, non tender. Cardiovascular system: S1 & S2 +, regular, No JVD. Gastrointestinal system: Abdomen soft, NT,ND, BS+. Nervous System:Alert, awake, moving extremities and grossly nonfocal Extremities: No edema, distal peripheral pulses palpable.  Skin: No rashes,no icterus. MSK: Normal muscle bulk,tone, power LUE fistula  Data Reviewed: I have personally reviewed following labs and imaging studies CBC: Recent Labs  Lab 06/24/20 0438 06/25/20 0130 06/25/20 0401 06/25/20 1939 06/26/20 0147 06/27/20 0145 06/28/20 0131  WBC 8.9 5.6  --   --  6.8 7.0 7.6  NEUTROABS  --  4.1  --   --  5.1  --   --   HGB 7.2* 6.3* 6.2* 7.5* 7.8* 7.9* 8.3*  HCT 23.3* 19.9* 20.0* 24.4* 25.2* 24.4* 26.9*  MCV 88.9 88.8  --   --  88.7 86.8 88.2  PLT 150 145*  --   --  138* 154 242   Basic Metabolic Panel: Recent Labs  Lab 06/22/20 0119 06/23/20 0159 06/23/20 0526 06/24/20 0438 06/25/20 0130 06/26/20 0147 06/27/20 0145 06/28/20 0131  NA 140 139  --  135 136 138 141 139  K 4.7 6.6*   < > 4.7 4.8 5.0 4.6 5.3*  CL 102 103  --  98 99 104 104 104  CO2 29 25  --  28 25 24 29 26   GLUCOSE 184* 144*  --  170* 206* 199* 170* 174*  BUN 23 43*  --  24* 40* 53* 35* 51*  CREATININE  3.51* 4.83*  --  3.20* 4.61* 5.39* 3.94* 4.83*  CALCIUM 8.8* 8.9  --  8.9 8.7* 8.8* 8.8* 9.1  MG 2.0 2.0  --  1.9  --   --  1.8 2.0  PHOS  --  5.6*  --   --   --   --   --   --    < > = values in this interval not displayed.   GFR: Estimated Creatinine Clearance: 16.4 mL/min (A) (by C-G formula based on SCr of 4.83 mg/dL (H)). Liver Function Tests: No results for input(s): AST, ALT, ALKPHOS, BILITOT, PROT, ALBUMIN in the last 168 hours. No results for input(s): LIPASE, AMYLASE in the last 168 hours. No results for input(s): AMMONIA in the last 168 hours. Coagulation Profile: No results for input(s): INR, PROTIME in the last 168 hours. Cardiac Enzymes: No results for input(s): CKTOTAL, CKMB, CKMBINDEX, TROPONINI in the last 168 hours. BNP (last 3 results) No results for input(s): PROBNP in the last 8760 hours. HbA1C: No results for input(s): HGBA1C in the last 72 hours. CBG: Recent Labs  Lab 06/27/20 0611 06/27/20 1128 06/27/20 1606 06/27/20 2116 06/28/20 0604  GLUCAP 160* 172* 129* 197* 160*   Lipid Profile: No results for input(s): CHOL, HDL, LDLCALC, TRIG, CHOLHDL, LDLDIRECT in the last 72 hours. Thyroid Function Tests: No results for input(s): TSH, T4TOTAL, FREET4, T3FREE, THYROIDAB in the last 72 hours. Anemia Panel: No results for input(s): VITAMINB12, FOLATE, FERRITIN, TIBC, IRON, RETICCTPCT in the last 72 hours. Sepsis Labs: No results for input(s): PROCALCITON, LATICACIDVEN in the last 168 hours.  No results found for this or any previous visit (from the past 240 hour(s)).   Radiology Studies: No results found.   LOS: 12 days  Antonieta Pert, MD [facility]  06/28/2020, 10:40 AM

## 2020-06-28 NOTE — Interval H&P Note (Signed)
History and Physical Interval Note:  06/28/2020 4:17 PM  [name]  has presented today for surgery, with the diagnosis of PERSISTENT HEMATURIA.  The various methods of treatment have been discussed with the patient and family. After consideration of risks, benefits and other options for treatment, the patient has consented to  Procedure(s) with comments: CYSTOSCOPY WITH CLOT EVACUATION AND FULGERATION OF BLEEDERS (N/A) - 1 HR as a surgical intervention.  The patient's history has been reviewed, patient examined, no change in status, stable for surgery.  I have reviewed the patient's chart and labs.  Questions were answered to the patient's satisfaction.     Lillette Boxer Jaxzen Vanhorn

## 2020-06-28 NOTE — Transfer of Care (Signed)
Immediate Anesthesia Transfer of Care Note  Patient: [name]  Procedure(s) Performed: CYSTOSCOPY WITH CLOT EVACUATION AND FULGERATION OF BLEEDERS (N/A ) TRANSURETHRAL RESECTION OF THE PROSTATE (TURP) (N/A Prostate)  Patient Location: PACU  Anesthesia Type:General  Level of Consciousness: drowsy  Airway & Oxygen Therapy: Patient Spontanous Breathing and Patient connected to face mask oxygen  Post-op Assessment: Report given to RN and Post -op Vital signs reviewed and stable  Post vital signs: Reviewed and stable  Last Vitals:  Vitals Value Taken Time  BP 121/62 06/28/20 1717  Temp    Pulse 81 06/28/20 1718  Resp    SpO2 97 % 06/28/20 1718  Vitals shown include unvalidated device data.  Last Pain:  Vitals:   06/28/20 1200  TempSrc: Oral  PainSc: 0-No pain      Patients Stated Pain Goal: 0 (22/17/98 1025)  Complications: No complications documented.

## 2020-06-28 NOTE — Op Note (Signed)
Preoperative diagnosis: Radiation-induced hemorrhagic cystitis with persistent bleeding  Postoperative diagnosis: Same  Principal procedure: Cystoscopy, clot evacuation, fulguration of bladder neck/prostatic bleeders, TURP  Surgeon: Zephaniah Lubrano  Anesthesia: General with LMA  Complications: None  Estimated blood loss: Less than 25 mL  Specimen: Prostate chips, to pathology  Drains: 54 French Foley catheter to gravity drainage  Indications: 70 year old male with history of prostate cancer.  He has multiple other medical issues as well.  He is 2 to 3 years out from external beam radiotherapy.  The patient developed gross hematuria with clot retention approximately 2 weeks ago.  He was admitted to the hospital 11 days ago for catheter placement, irrigation, and eventually needed anesthetic cystoscopy and clot evacuation/fulguration.  The patient has had persistent bleeding since that time, with recent ultrasound of the bladder showing a blood clot.  He presents at this time for repeat cystoscopy, clot evacuation and fulguration of the bladder.  Findings: He did have mild meatal stenosis.  This was dilated to 30 [place] with [employer].  The prostatic urethra showed necrotic urothelium with small amount of adherent blood clot circumferentially.  There was an obstructive median lobe with a significant amount of telangiectatic vessels, necrotic tissue and mild amount of bleeding.  There was minimal bleeding at the bladder neck area.  Ureteral orifice ease were normal.  There was, in the midline posterior/superior bladder, catheter related trauma.  No urothelial lesions were seen.  Description of procedure: The patient was properly identified in the holding area.  He received antibiotics this morning in dialysis.  He was taken the operating room where general anesthetic was administered with the LMA.  He was placed in the dorsolithotomy position.  Genitalia and perineum were prepped, draped, proper  timeout performed.  Urethral meatus was dilated to 30 [place] gently with [name] sound sequentially from [place].  At this point the 50 French resectoscope was passed using the visual obturator.  This was easily entered into the bladder.  Circumferential inspection was performed of the bladder.  It was evident that, to me, most of the pathology with the bleeding was noted on the median lobe.  There was a significant amount of necrotic urothelium there as well as obvious obstruction.  This was not a very large median lobe but I think was significantly pathologic.  I took great care to avoid the ureteral orifice ease with the cautery loop.  However, all small erythematous areas around the bladder neck that were affected by the radiation were carefully electrocoagulated with the coag current.  The traumatic area in the posterior midline, most likely where the tip of the catheter was, was also cauterized.  The bladder neck was circumferentially cauterized.  With this median lobe to be showing obstruction and significantly abnormal urothelium, this was resected.  I took great care not to resect any of the median lobes.  Tissue for resection was probably less than 5 mL and volume.  Careful electrocoagulation of the resected area was performed, all the way into and circumferentially around the prostatic urethra.  The prostate chips were irrigated from the bladder and sent for pathology labeled "prostate chips".  I took my time to perform careful inspection of the prostatic urethra and the bladder.  No significant bleeding was seen at this point.  The scope was then removed.  I placed a 26 French Foley catheter, balloon filled with 20 cc of water, hooked to dependent drainage.  Effluent was clear, colorless.  At this point the  procedure was terminated.  The patient was awakened, extubated, taken to the PACU in stable condition, having tolerated the procedure well

## 2020-06-29 ENCOUNTER — Inpatient Hospital Stay (HOSPITAL_COMMUNITY): Payer: Medicare Other | Admitting: Anesthesiology

## 2020-06-29 ENCOUNTER — Encounter (HOSPITAL_COMMUNITY): Payer: Self-pay | Admitting: Internal Medicine

## 2020-06-29 ENCOUNTER — Inpatient Hospital Stay (HOSPITAL_COMMUNITY): Payer: Medicare Other

## 2020-06-29 ENCOUNTER — Encounter (HOSPITAL_COMMUNITY)
Admission: EM | Disposition: A | Discharge status: Home / Self Care | Payer: Self-pay | Source: Home / Self Care | Attending: Internal Medicine

## 2020-06-29 DIAGNOSIS — L8990 Pressure ulcer of unspecified site, unspecified stage: Secondary | ICD-10-CM | POA: Insufficient documentation

## 2020-06-29 HISTORY — PX: CYSTOSCOPY: SHX5120

## 2020-06-29 LAB — GLUCOSE, CAPILLARY
Glucose-Capillary: 201 mg/dL — ABNORMAL HIGH (ref 70–99)
Glucose-Capillary: 172 mg/dL — ABNORMAL HIGH (ref 70–99)
Glucose-Capillary: 151 mg/dL — ABNORMAL HIGH (ref 70–99)
Glucose-Capillary: 135 mg/dL — ABNORMAL HIGH (ref 70–99)
Glucose-Capillary: 220 mg/dL — ABNORMAL HIGH (ref 70–99)
Glucose-Capillary: 180 mg/dL — ABNORMAL HIGH (ref 70–99)

## 2020-06-29 LAB — CBC
HCT: 26.7 % — ABNORMAL LOW (ref 39.0–52.0)
Hemoglobin: 8.5 g/dL — ABNORMAL LOW (ref 13.0–17.0)
MCH: 27.7 pg (ref 26.0–34.0)
MCHC: 31.8 g/dL (ref 30.0–36.0)
MCV: 87 fL (ref 80.0–100.0)
Platelets: 180 10*3/uL (ref 150–400)
RBC: 3.07 MIL/uL — ABNORMAL LOW (ref 4.22–5.81)
RDW: 16.4 % — ABNORMAL HIGH (ref 11.5–15.5)
WBC: 11 10*3/uL — ABNORMAL HIGH (ref 4.0–10.5)
nRBC: 0 % (ref 0.0–0.2)

## 2020-06-29 SURGERY — CYSTOSCOPY
Anesthesia: General | Site: Bladder

## 2020-06-29 MED ORDER — SODIUM CHLORIDE 0.9 % IR SOLN: 3000.0000 mL | Status: DC

## 2020-06-29 MED ORDER — PROPOFOL 10 MG/ML IV BOLUS
INTRAVENOUS | Status: AC
  Filled 2020-06-29: qty 20

## 2020-06-29 MED ORDER — STERILE WATER FOR IRRIGATION IR SOLN
Status: DC | PRN
  Administered 2020-06-29: 3000 mL

## 2020-06-29 MED ORDER — HYDROMORPHONE HCL 1 MG/ML IJ SOLN
1.0000 mg | INTRAMUSCULAR | Status: AC
Start: 2020-06-29 — End: 2020-06-29
  Administered 2020-06-29: 1 mg via INTRAVENOUS

## 2020-06-29 MED ORDER — LIDOCAINE 2% (20 MG/ML) 5 ML SYRINGE
INTRAMUSCULAR | Status: DC | PRN
  Administered 2020-06-29: 40 mg via INTRAVENOUS

## 2020-06-29 MED ORDER — PROPOFOL 10 MG/ML IV BOLUS
INTRAVENOUS | Status: DC | PRN
  Administered 2020-06-29: 80 mg via INTRAVENOUS

## 2020-06-29 MED ORDER — PHENYLEPHRINE HCL (PRESSORS) 10 MG/ML IV SOLN
INTRAVENOUS | Status: DC | PRN
  Administered 2020-06-29 (×2): 40 ug via INTRAVENOUS
  Administered 2020-06-29: 80 ug via INTRAVENOUS

## 2020-06-29 MED ORDER — SODIUM CHLORIDE 0.9 % IR SOLN
Status: DC | PRN
  Administered 2020-06-29: 3000 mL via INTRAVESICAL

## 2020-06-29 MED ORDER — FENTANYL CITRATE (PF) 250 MCG/5ML IJ SOLN
INTRAMUSCULAR | Status: AC
  Filled 2020-06-29: qty 5

## 2020-06-29 MED ORDER — FENTANYL CITRATE (PF) 250 MCG/5ML IJ SOLN
INTRAMUSCULAR | Status: DC | PRN
  Administered 2020-06-29 (×2): 25 ug via INTRAVENOUS

## 2020-06-29 MED ORDER — MIDAZOLAM HCL 2 MG/2ML IJ SOLN
INTRAMUSCULAR | Status: AC
  Filled 2020-06-29: qty 2

## 2020-06-29 MED ORDER — PHENYLEPHRINE HCL-NACL 10-0.9 MG/250ML-% IV SOLN
INTRAVENOUS | Status: DC | PRN
  Administered 2020-06-29: 50 ug/min via INTRAVENOUS

## 2020-06-29 MED ORDER — ONDANSETRON HCL 4 MG/2ML IJ SOLN
INTRAMUSCULAR | Status: DC | PRN
  Administered 2020-06-29: 4 mg via INTRAVENOUS

## 2020-06-29 MED ORDER — SODIUM CHLORIDE 0.9 % IV SOLN: INTRAVENOUS | Status: DC | PRN

## 2020-06-29 MED ORDER — SODIUM ZIRCONIUM CYCLOSILICATE 10 G PO PACK
10.0000 g | PACK | Freq: Once | ORAL | Status: AC
  Administered 2020-06-29: 10 g via ORAL
  Filled 2020-06-29: qty 1

## 2020-06-29 SURGICAL SUPPLY — 39 items
BAG URINE DRAINAGE (UROLOGICAL SUPPLIES) IMPLANT
BAG URO CATCHER STRL LF (MISCELLANEOUS) ×2 IMPLANT
CATH COUDE FOLEY 2W 5CC 16FR (CATHETERS) IMPLANT
CATH COUDE FOLEY 2W 5CC 18FR (CATHETERS) IMPLANT
CATH FOLEY 2WAY SLVR  5CC 16FR (CATHETERS)
CATH FOLEY 2WAY SLVR 5CC 16FR (CATHETERS) IMPLANT
CATH HEMA 3WAY 30CC 22FR COUDE (CATHETERS) ×1 IMPLANT
CATH INTERMIT  6FR 70CM (CATHETERS) ×1 IMPLANT
CATH URET 5FR 28IN CONE TIP (BALLOONS)
CATH URET 5FR 28IN OPEN ENDED (CATHETERS) ×1 IMPLANT
CATH URET 5FR 70CM CONE TIP (BALLOONS) ×1 IMPLANT
COVER WAND RF STERILE (DRAPES) ×2 IMPLANT
DRAPE CAMERA CLOSED 9X96 (DRAPES) ×1 IMPLANT
ELECT REM PT RETURN 9FT ADLT (ELECTROSURGICAL)
ELECTRODE REM PT RTRN 9FT ADLT (ELECTROSURGICAL) IMPLANT
GLOVE BIO SURGEON STRL SZ8 (GLOVE) ×2 IMPLANT
GOWN STRL REUS W/ TWL LRG LVL3 (GOWN DISPOSABLE) ×1 IMPLANT
GOWN STRL REUS W/ TWL XL LVL3 (GOWN DISPOSABLE) ×1 IMPLANT
GOWN STRL REUS W/TWL LRG LVL3 (GOWN DISPOSABLE) ×1
GOWN STRL REUS W/TWL XL LVL3 (GOWN DISPOSABLE) ×1
GUIDEWIRE ANG ZIPWIRE 038X150 (WIRE) ×2 IMPLANT
GUIDEWIRE STR DUAL SENSOR (WIRE) ×1 IMPLANT
IV NS IRRIG 3000ML ARTHROMATIC (IV SOLUTION) ×2 IMPLANT
KIT TURNOVER KIT B (KITS) ×2 IMPLANT
MANIFOLD NEPTUNE II (INSTRUMENTS) ×2 IMPLANT
NS IRRIG 1000ML POUR BTL (IV SOLUTION) ×2 IMPLANT
PACK CYSTO (CUSTOM PROCEDURE TRAY) ×1 IMPLANT
PAD ARMBOARD 7.5X6 YLW CONV (MISCELLANEOUS) ×4 IMPLANT
SET IRRIG Y TYPE TUR BLADDER L (SET/KITS/TRAYS/PACK) ×2 IMPLANT
SOL PREP POV-IOD 4OZ 10% (MISCELLANEOUS) ×2 IMPLANT
SURGILUBE 2OZ TUBE FLIPTOP (MISCELLANEOUS) ×2 IMPLANT
SYR 10ML LL (SYRINGE) ×1 IMPLANT
SYR TOOMEY IRRIG 70ML (MISCELLANEOUS) ×2
SYRINGE TOOMEY IRRIG 70ML (MISCELLANEOUS) IMPLANT
TUBE CONNECTING 12X1/4 (SUCTIONS) ×2 IMPLANT
UNDERPAD 30X36 HEAVY ABSORB (UNDERPADS AND DIAPERS) ×2 IMPLANT
WATER STERILE IRR 1000ML POUR (IV SOLUTION) ×2 IMPLANT
WATER STERILE IRR 3000ML UROMA (IV SOLUTION) ×3 IMPLANT
WIRE COONS/BENSON .038X145CM (WIRE) IMPLANT

## 2020-06-29 NOTE — Progress Notes (Signed)
On call Urology MD Celene Squibb called and notified pt still has no urine output, she said she has seen the pelvis US report and will keep pt NPO. If pt is very uncomfortable will try to get to OR soon. Notified pt is resting now with occasional screaming. Bed alarm on. NPO now. Will continue to monitor.

## 2020-06-29 NOTE — Anesthesia Postprocedure Evaluation (Signed)
Anesthesia Post Note  Patient: [name]  Procedure(s) Performed: CYSTOSCOPY WITH CLOT EVACUATION AND  FULGERATION (N/A Bladder)     Patient location during evaluation: PACU Anesthesia Type: General Level of consciousness: patient cooperative and lethargic Pain management: pain level controlled Vital Signs Assessment: post-procedure vital signs reviewed and stable Respiratory status: spontaneous breathing, nonlabored ventilation, respiratory function stable and patient connected to nasal cannula oxygen Cardiovascular status: blood pressure returned to baseline and stable Postop Assessment: no apparent nausea or vomiting Anesthetic complications: no   No complications documented.  Last Vitals:  Vitals:   06/29/20 0655 06/29/20 0935  BP:  127/60  Pulse: 80 79  Resp: 17 16  Temp:  36.7 C  SpO2: 97% 97%    Last Pain:  Vitals:   06/29/20 0935  TempSrc: Axillary  PainSc:                  [name]

## 2020-06-29 NOTE — Progress Notes (Signed)
Post-op note  Subjective: The patient is doing well. Somnolent.  Objective: Vital signs in last 24 hours: Temp:  [97 F (36.1 C)-99.1 F (37.3 C)] 98.7 F (37.1 C) (03/24 1649) Pulse Rate:  [78-93] 84 (03/24 1649) Resp:  [10-18] 17 (03/24 1649) BP: (112-164)/(53-99) 128/60 (03/24 1649) SpO2:  [96 %-100 %] 96 % (03/24 1649)  Intake/Output from previous day: 03/23 0701 - 03/24 0700 In: 650 [P.O.:100; I.V.:550] Out: 3550 [Urine:450; Blood:100] Intake/Output this shift: Total I/O In: -  Out: 2400 [Urine:2400]  Bladder irrigant clear grapefruit-colored..  Lab Results: Recent Labs    06/28/20 0131 06/28/20 1516 06/29/20 0706  HGB 8.3* 9.5* 8.5*  HCT 26.9* 28.0* 26.7*    Assessment/Plan: POD#1/11. Urine looks great  If this continues will remove cathter in am     [name]. Jon Web, MD   LOS: 13 days   [name] [name] 06/29/2020, 5:49 PM

## 2020-06-29 NOTE — Progress Notes (Signed)
[name] Progress Note  Subjective: seen in room. K+ 5.5 this am.   Vitals:   06/29/20 0645 06/29/20 0655 06/29/20 0935 06/29/20 1145  BP: (!) 145/74  127/60 138/74  Pulse: 81 80 79 85  Resp: 12 17 16 18   Temp: 97.8 F (36.6 C)  98 F (36.7 C)   TempSrc: Axillary  Axillary   SpO2: 97% 97% 97% 98%  Weight:      Height:        Exam:   alert, nad   no jvd  Chest cta bilat  Cor reg no RG  Abd soft ntnd no ascites   Ext no LE edema   Alert, NF, ox3   L AVF +bruit    OP HD:  HomeHD NxStage MTWF EDW 85kg No heparin  - last esa was mircera 150 ug in Nov 2021, none since  Assessment/Plan: 1. Radiation-induced hemorrhagic cystitis - hx prostate ca s/p radiation therapy. Urology following, s/pcystoscopy/clot removalin OR 3/12. Also has had cauterization, alum irrigation but still bleeding. Back to OR 3/23 for clot evac/ fulguration of bleeders and TURP. Had to go back to OR again this AM for worsening bleeding/ pain/ clot retention. Clot was evacuated, no new bleeders. Per Urology.  2. ESRD - Home HD. Continue MWF 4h each while here.  No heparin w/ HD. Mild K+ ^, will give lokelma x 1 today, HD tomorrow.  3. Hypertension/volume - BPsl elevated. Continue home meds - Coreg, Losartan, hydralazine. Max UF 3 L w/ HD today and again Friday 4. Anemia - not on ESA as outpatient. Restarted darbepoetin 200 mcg q Wednesday here (rec'd 3/16, 3/23). Next dose 3/30. Transfuse as needed. SP total 4u pRBCs this admit. Hb 8-9 range now.  5. Metabolic bone disease - OPCa/Phos at goal. Continue home calcitriol. 6. DM: per primary 7. H/o osteomyelitis   [name] 06/29/2020, 12:30 PM   Recent Labs  Lab 06/23/20 0159 06/23/20 0526 06/28/20 0131 06/28/20 1516 06/29/20 0149 06/29/20 0706  K 6.6*   < > 5.3* 4.3 5.5*  --   BUN 43*   < > 51* 21 32*  --   CREATININE 4.83*   < > 4.83* 2.80* 4.01*  --   CALCIUM 8.9   < > 9.1  --  9.2  --   PHOS 5.6*  --   --   --    --   --   HGB 7.1*   < > 8.3* 9.5*  --  8.5*   < > = values in this interval not displayed.   Inpatient medications: . sodium chloride   Intravenous Once  . allopurinol  200 mg Oral Daily  . calcitRIOL  0.5 mcg Oral Daily  . carvedilol  12.5 mg Oral BID WC  . chlorhexidine  15 mL Mouth Rinse Daily  . Chlorhexidine Gluconate Cloth  6 each Topical Daily  . Chlorhexidine Gluconate Cloth  6 each Topical Q0600  . darbepoetin (ARANESP) injection - DIALYSIS  200 mcg Intravenous Q Wed-HD  . docusate sodium  100 mg Oral BID  . feeding supplement  237 mL Oral Q24H  . insulin aspart  0-6 Units Subcutaneous TID WC  . levothyroxine  112 mcg Oral QAC breakfast  . losartan  100 mg Oral Daily  . multivitamin  1 tablet Oral QHS  . pantoprazole  40 mg Oral Daily  . pregabalin  150 mg Oral BID  . rosuvastatin  10 mg Oral Daily  . sodium zirconium cyclosilicate  10 g Oral Once  . tamsulosin  0.4 mg Oral Daily   . sodium chloride irrigation 3,000 mL (06/21/20 2233)   acetaminophen **OR** acetaminophen, calcium carbonate (dosed in mg elemental calcium), camphor-menthol **AND** hydrOXYzine, docusate sodium, hydrALAZINE, HYDROmorphone (DILAUDID) injection, ondansetron **OR** ondansetron (ZOFRAN) IV, oxyCODONE-acetaminophen, polyvinyl alcohol, sorbitol

## 2020-06-29 NOTE — H&P (View-Only) (Signed)
1 Day Post-Op Subjective: Minimal UOP via foley after cysto, clot evacuation, TURP, and fulguration yesterday afternoon. 20 Fr foley catheter clotted off and unable to irrigate this evening. Switched for a 24 Fr 3-way hematuria catheter by urology overnight, but unable to irrigate hematuria catheter. Pelvic ultrasound showed ~9cm large bladder clot. Recent bladder scan with ~220-240cc volume. Patient drowsy/confused after multiple doses of dilaudid for associated pain.   Objective: Vital signs in last 24 hours: Temp:  [97 F (36.1 C)-99.1 F (37.3 C)] 99.1 F (37.3 C) (03/24 0017) Pulse Rate:  [80-93] 93 (03/24 0017) Resp:  [11-18] 15 (03/24 0017) BP: (102-171)/(60-99) 147/64 (03/24 0017) SpO2:  [96 %-100 %] 96 % (03/24 0017) Weight:  [88 kg-94.4 kg] 88 kg (03/23 1126)  Intake/Output from previous day: 03/23 0701 - 03/24 0700 In: 250 [I.V.:250] Out: 3000  Intake/Output this shift: No intake/output data recorded.  Physical Exam:  Constitutional: Drowsy/confused. Laying in bed in NAD Cardiovascular: Regular rate Pulmonary/Chest: NWOB on RA GU: 3-way 24 Fr hematuria catheter in place with scant output of cherry urine. CBI connected but clamped    Lab Results: Recent Labs    06/27/20 0145 06/28/20 0131 06/28/20 1516  HGB 7.9* 8.3* 9.5*  HCT 24.4* 26.9* 28.0*   BMET Recent Labs    06/27/20 0145 06/28/20 0131 06/28/20 1516  NA 141 139 138  K 4.6 5.3* 4.3  CL 104 104 100  CO2 29 26  --   GLUCOSE 170* 174* 139*  BUN 35* 51* 21  CREATININE 3.94* 4.83* 2.80*  CALCIUM 8.8* 9.1  --    No results for input(s): LABPT, INR in the last 72 hours. No results for input(s): LABURIN in the last 72 hours. Results for orders placed or performed during the hospital encounter of 06/16/20  SARS CORONAVIRUS 2 (TAT 6-24 HRS) Nasopharyngeal Nasopharyngeal Swab     Status: None   Collection Time: 06/16/20  7:12 PM   Specimen: Nasopharyngeal Swab  Result Value Ref Range Status   SARS  Coronavirus 2 NEGATIVE NEGATIVE Final    Comment: (NOTE) SARS-CoV-2 target nucleic acids are NOT DETECTED.  The SARS-CoV-2 RNA is generally detectable in upper and lower respiratory specimens during the acute phase of infection. Negative results do not preclude SARS-CoV-2 infection, do not rule out co-infections with other pathogens, and should not be used as the sole basis for treatment or other patient management decisions. Negative results must be combined with clinical observations, patient history, and epidemiological information. The expected result is Negative.  Fact Sheet for Patients: SugarRoll.be  Fact Sheet for Healthcare Providers: https://www.woods-mathews.com/  This test is not yet approved or cleared by the [place] FDA and  has been authorized for detection and/or diagnosis of SARS-CoV-2 by FDA under an Emergency Use Authorization (EUA). This EUA will remain  in effect (meaning this test can be used) for the duration of the COVID-19 declaration under Se ction 564(b)(1) of the Act, 21 U.S.C. section 360bbb-3(b)(1), unless the authorization is terminated or revoked sooner.  Performed at [facility], [place] [address], [place], [place] [address]     Studies/Results: US PELVIS LIMITED (TRANSABDOMINAL ONLY)  Result Date: 06/29/2020 CLINICAL DATA:  70 year old male with clot within the urinary bladder. EXAM: LIMITED ULTRASOUND OF PELVIS TECHNIQUE: Limited transabdominal ultrasound examination of the pelvis was performed. COMPARISON:  Ultrasound dated 06/25/2020. FINDINGS: There is a 7.1 x 7.5 x 8.8 cm rounded heterogeneous echogenic mass with no internal vascularity is noted within the urinary bladder. This  is most consistent with a blood clot. Clinical correlation and follow-up recommended. IMPRESSION: Large blood clot within the urinary bladder. Electronically Signed   By: Anner Crete M.D.   On: 06/29/2020 01:50    I reviewed ultrasound images.  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  Unfortunately, this has been resistant to treatment, both cystoscopy, clot evacuation, cauterization with continuous bladder irrigation, alum irrigation, and transurethral resection of hypervascular median prostatic lobe. Unfortunately, he reformed a large bladder clot after his procedure yesterday that can't be removed by bedside irrigation of his hematuria catheter. Thus, we recommend urgent cystourethroscopy, clot evacuation, and fulguration overnight to prevent further bladder distention. We will resume continuous bladder irrigation after the procedure to prevent reformation of clot. We will slowly titate his CBI off.   Per patient's RN, she doesn't believe he has eaten during her shift since 7pm. Patient is unable to say when he last ate. Given the urgent nature of this case, we feel that it is necessary to proceed overnight.   Given that the patient was drowsy/confused after multiple doses of dilaudid for pain, he cannot give informed consent. I discussed the procedure with the patient as well as his wife over the phone. I discussed procedural risks, benefits, and alternatives with his wife, who provided informed verbal consent for the procedure over the phone.   If this fails, he will probably eventually need eventual formalin placement in the bladder.  We do not think he is a candidate for cystectomy.   LOS: 13 days   Celene Squibb 06/29/2020, 3:07 AM

## 2020-06-29 NOTE — Progress Notes (Signed)
PROGRESS NOTE    [name]  MVH:[id number] DOB: 11-Jan-1951 DOA: 06/16/2020 PCP: Lorenda Hatchet, FNP   Chief Complaint  Patient presents with  . Hematuria  Brief Narrative: 70 year old male with history of prostate cancer hypothyroidism, hypertension, hyperlipidemia, diabetes mellitus ESRD on HD presented with hematuria for a few days.  He was admitted seen by urology recently as outpatient but hematuria had gotten worse so was brought to the hospital.  He underwent Foley catheter placement and three-way irrigation.  Patient continued to have large hematuria with clots underwent cystoscopic evaluation with fulguration on 06/17/2020, hematuria persisted needing blood transfusion and continued on CBI.  Urology following closely.  Subjective:  Seen and examined the patient this morning. Appears very sleepy but able to wake up briefly move his lower extremities. Overnight events noted patient had repeat cystoscopy and clot evacuation and fulguration of prostatic bladder by Dr. [name]. Hb this am 8.5 gm. Nursing report he is working up slowly. He is on room air.  Assessment & Plan:  Gross hematuria: Suspecting due to radiation cystitis followed by urology was on CBI underwent cystoscopy with fulguration 3/12 with ongoing hematuria-patient had repeat x 2 cystoscopy post-dialysis 3/23 and overnight 3/24 due to recurrent hematuria/difficult to pass Foley. Has foley in place now and urine is clear.  Anemia of due to acute blood loss from hematuria and chronic renal disease not on ESA as an outpatient placed on darbepoetin qwednesday.  s/p 4 units PRBC this admission.  Overnight episode of hematuria needing cystoscopy x2.Hemoglobin appears stable 8.5 g.Monitor monitor. Recent Labs  Lab 06/26/20 0147 06/27/20 0145 06/28/20 0131 06/28/20 1516 06/29/20 0706  HGB 7.8* 7.9* 8.3* 9.5* 8.5*  HCT 25.2* 24.4* 26.9* 28.0* 26.7*   History of prostate cancer status post radiation in 2018  ESRD  on HD MWF, status post HD 3/23-nephrology on board and defer dialysis to nephrology.   Hypertension/volume blood pressure is elevated continue current Coreg losartan hydralazine.  Continue dialysis and ultrafiltration as per nephrology.  Lethargy sleepiness: patient did not sleep overnight had  procedures cystoscopy and anesthesia.  He is able to wake up move his extremities. Monitor closely.  Metabolic bone disease continue PhosLo and calcitriol.  Calcium at goal.  Diabetes mellitus, controlled hemoglobin A1c 5.7, blood sugar fairly controlled on sliding scale insulin. Recent Labs  Lab 06/28/20 1200 06/28/20 1434 06/28/20 1720 06/28/20 2117 06/29/20 0413  GLUCAP 120* 131* 120* 262* 201*   Hyperkalemia at 5.5 this am.nephrology on board for dialysis.  Discussed with nephrology they are planning for [facility]. Recent Labs  Lab 06/26/20 0147 06/27/20 0145 06/28/20 0131 06/28/20 1516 06/29/20 0149  K 5.0 4.6 5.3* 4.3 5.5*   Hypercholesteremia: On Crestor  Hypertension BP controlled, continue Coreg Cozaar.  Hypothyroidism TSH stable 4.8 continue home Synthroid  Overweight with BMI 27.8.  Diet Order            Diet NPO time specified  Diet effective now                 Nutrition Problem: Increased nutrient needs Etiology: chronic illness (ESRD on HD) Signs/Symptoms: estimated needs Interventions: MVI,Prostat,Nepro shake Patient's Body mass index is 27.84 kg/m.  DVT prophylaxis: SCDs Start: 06/16/20 1734 Code Status:   Code Status: Full Code  Family Communication: plan of care discussed with patient at bedside and nursing staff and nephrology. Call his wife number, but no answer. Status is: Inpatient Remains inpatient appropriate because:Inpatient level of care appropriate due to severity of illness Dispo:  The patient is from: Home with wife              Anticipated d/c is to: Home              Patient currently is not medically stable to d/c.   Difficult to place  patient No  Unresulted Labs (From admission, onward)          Start     Ordered   06/30/20 0500  CBC  Daily,   R     Question:  Specimen collection method  Answer:  Lab=Lab collect   06/29/20 0621   06/29/20 0656  Glucose, capillary  Once,   R        06/29/20 0656   06/29/20 0541  Glucose, capillary  Once,   R        06/29/20 0541   06/26/20 2778  Basic metabolic panel  Daily,   R     Question:  Specimen collection method  Answer:  Lab=Lab collect   06/25/20 0743   Signed and Held  Renal function panel  Once,   R       Question:  Specimen collection method  Answer:  Lab=Lab collect   Signed and Held   Signed and Held  CBC  Once,   R       Question:  Specimen collection method  Answer:  Lab=Lab collect   Signed and Held        Medications reviewed:  Scheduled Meds: . sodium chloride   Intravenous Once  . allopurinol  200 mg Oral Daily  . calcitRIOL  0.5 mcg Oral Daily  . carvedilol  12.5 mg Oral BID WC  . chlorhexidine  15 mL Mouth Rinse Daily  . Chlorhexidine Gluconate Cloth  6 each Topical Daily  . Chlorhexidine Gluconate Cloth  6 each Topical Q0600  . darbepoetin (ARANESP) injection - DIALYSIS  200 mcg Intravenous Q Wed-HD  . docusate sodium  100 mg Oral BID  . feeding supplement  237 mL Oral Q24H  . insulin aspart  0-6 Units Subcutaneous TID WC  . levothyroxine  112 mcg Oral QAC breakfast  . losartan  100 mg Oral Daily  . multivitamin  1 tablet Oral QHS  . pantoprazole  40 mg Oral Daily  . pregabalin  150 mg Oral BID  . rosuvastatin  10 mg Oral Daily  . tamsulosin  0.4 mg Oral Daily   Continuous Infusions: . sodium chloride irrigation 3,000 mL (06/21/20 2233)    Consultants:see note  Procedures:see note  Antimicrobials: Anti-infectives (From admission, onward)   Start     Dose/Rate Route Frequency Ordered Stop   06/28/20 1500  ceFAZolin (ANCEF) IVPB 2g/100 mL premix  Status:  Discontinued        2 g 200 mL/hr over 30 Minutes Intravenous To ShortStay  Procedural 06/27/20 0928 06/28/20 0814   06/28/20 1115  ceFAZolin (ANCEF) IVPB 2g/100 mL premix        2 g 200 mL/hr over 30 Minutes Intravenous To ShortStay Procedural 06/28/20 0814 06/28/20 1114   06/16/20 1900  cefTRIAXone (ROCEPHIN) 1 g in sodium chloride 0.9 % 100 mL IVPB  Status:  Discontinued        1 g 200 mL/hr over 30 Minutes Intravenous Every 24 hours 06/16/20 1855 06/20/20 0606     Culture/Microbiology    Component Value Date/Time   SDES URINE, RANDOM 06/15/2020 [place] 06/15/2020 1159   CULT  06/15/2020 1159  NO GROWTH Performed at [facility], [place] [address] [place], [place] [address]    REPTSTATUS 06/16/2020 FINAL 06/15/2020 1159    Other culture-see note  Objective: Vitals: Today's Vitals   06/29/20 0625 06/29/20 0630 06/29/20 0645 06/29/20 0655  BP: 138/82  (!) 145/74   Pulse: 86 78 81 80  Resp: 12 11 12 17   Temp: (!) 97.2 F (36.2 C)  97.8 F (36.6 C)   TempSrc:   Axillary   SpO2: 100% 99% 97% 97%  Weight:      Height:      PainSc: Asleep  Asleep     Intake/Output Summary (Last 24 hours) at 06/29/2020 0816 Last data filed at 06/29/2020 0553 Gross per 24 hour  Intake 650 ml  Output 3550 ml  Net -2900 ml   Filed Weights   06/28/20 0534 06/28/20 0715 06/28/20 1126  Weight: 91.4 kg 94.4 kg 88 kg   Weight change: 2.955 kg  Intake/Output from previous day: 03/23 0701 - 03/24 0700 In: 650 [P.O.:100; I.V.:550] Out: 3550 [Urine:450; Blood:100] Intake/Output this shift: No intake/output data recorded. Filed Weights   06/28/20 0534 06/28/20 0715 06/28/20 1126  Weight: 91.4 kg 94.4 kg 88 kg    Examination: General exam: Sleepy able to wake up and say his name on room air. HEENT:Oral mucosa moist, Ear/Nose WNL grossly, dentition normal. Respiratory system: bilaterally diminishedd,no wheezing or crackles,no use of accessory muscle Cardiovascular system: S1 & S2 +, No JVD,. Gastrointestinal system: Abdomen soft, NT,ND,  BS+ Nervous System: Sleepy and lethargic able to move his extremities.  Extremities: No edema, distal peripheral pulses palpable.  Skin: No rashes,no icterus. MSK: Normal muscle bulk,tone, power Left upper extremity fistula present.  Data Reviewed: I have personally reviewed following labs and imaging studies CBC: Recent Labs  Lab 06/25/20 0130 06/25/20 0401 06/26/20 0147 06/27/20 0145 06/28/20 0131 06/28/20 1516 06/29/20 0706  WBC 5.6  --  6.8 7.0 7.6  --  11.0*  NEUTROABS 4.1  --  5.1  --   --   --   --   HGB 6.3*   < > 7.8* 7.9* 8.3* 9.5* 8.5*  HCT 19.9*   < > 25.2* 24.4* 26.9* 28.0* 26.7*  MCV 88.8  --  88.7 86.8 88.2  --  87.0  PLT 145*  --  138* 154 156  --  180   < > = values in this interval not displayed.   Basic Metabolic Panel: Recent Labs  Lab 06/23/20 0159 06/23/20 0526 06/24/20 0438 06/25/20 0130 06/26/20 0147 06/27/20 0145 06/28/20 0131 06/28/20 1516 06/29/20 0149  NA 139  --  135 136 138 141 139 138 138  K 6.6*   < > 4.7 4.8 5.0 4.6 5.3* 4.3 5.5*  CL 103  --  98 99 104 104 104 100 101  CO2 25  --  28 25 24 29 26   --  26  GLUCOSE 144*  --  170* 206* 199* 170* 174* 139* 258*  BUN 43*  --  24* 40* 53* 35* 51* 21 32*  CREATININE 4.83*  --  3.20* 4.61* 5.39* 3.94* 4.83* 2.80* 4.01*  CALCIUM 8.9  --  8.9 8.7* 8.8* 8.8* 9.1  --  9.2  MG 2.0  --  1.9  --   --  1.8 2.0  --   --   PHOS 5.6*  --   --   --   --   --   --   --   --    < > =  values in this interval not displayed.   GFR: Estimated Creatinine Clearance: 19.2 mL/min (A) (by C-G formula based on SCr of 4.01 mg/dL (H)). Liver Function Tests: No results for input(s): AST, ALT, ALKPHOS, BILITOT, PROT, ALBUMIN in the last 168 hours. No results for input(s): LIPASE, AMYLASE in the last 168 hours. No results for input(s): AMMONIA in the last 168 hours. Coagulation Profile: No results for input(s): INR, PROTIME in the last 168 hours. Cardiac Enzymes: No results for input(s): CKTOTAL, CKMB, CKMBINDEX,  TROPONINI in the last 168 hours. BNP (last 3 results) No results for input(s): PROBNP in the last 8760 hours. HbA1C: No results for input(s): HGBA1C in the last 72 hours. CBG: Recent Labs  Lab 06/28/20 1200 06/28/20 1434 06/28/20 1720 06/28/20 2117 06/29/20 0413  GLUCAP 120* 131* 120* 262* 201*   Lipid Profile: No results for input(s): CHOL, HDL, LDLCALC, TRIG, CHOLHDL, LDLDIRECT in the last 72 hours. Thyroid Function Tests: No results for input(s): TSH, T4TOTAL, FREET4, T3FREE, THYROIDAB in the last 72 hours. Anemia Panel: No results for input(s): VITAMINB12, FOLATE, FERRITIN, TIBC, IRON, RETICCTPCT in the last 72 hours. Sepsis Labs: No results for input(s): PROCALCITON, LATICACIDVEN in the last 168 hours.  No results found for this or any previous visit (from the past 240 hour(s)).   Radiology Studies: US PELVIS LIMITED (TRANSABDOMINAL ONLY)  Result Date: 06/29/2020 CLINICAL DATA:  70 year old male with clot within the urinary bladder. EXAM: LIMITED ULTRASOUND OF PELVIS TECHNIQUE: Limited transabdominal ultrasound examination of the pelvis was performed. COMPARISON:  Ultrasound dated 06/25/2020. FINDINGS: There is a 7.1 x 7.5 x 8.8 cm rounded heterogeneous echogenic mass with no internal vascularity is noted within the urinary bladder. This is most consistent with a blood clot. Clinical correlation and follow-up recommended. IMPRESSION: Large blood clot within the urinary bladder. Electronically Signed   By: Anner Crete M.D.   On: 06/29/2020 01:50     LOS: 13 days   Antonieta Pert, MD [facility]  06/29/2020, 8:16 AM

## 2020-06-29 NOTE — Significant Event (Signed)
Urology called by patient's RN. RN reports minimal urine output via foley catheter since the start of her shift at 7pm and inability to flush foley catheter despite multiple different attempts with bladder scan showing 170-240cc.   Urology evaluated patient at bedside. I attempted to flush his 20 Fr 2-way foley catheter, but I was unable to pull back on the foley catheter. Given that the foley was clogged, likely due to clot, I removed his 20 Fr 2-way foley catheter. A medium-large clot was removed with the foley catheter.   Using sterile technique, I placed a 24 Fr 3-way hematuria catheter with 10cc sterile water in the balloon. CBI was connected. I flushed the foley catheter with return of several small-medium clots, but I was unable to flush the catheter further despite multiple attempts. Bladder scan showed ~220-240cc. I attempted to start CBI, but the foley wasn't draining, so I clamped off CBI. I again tried to flush the foley without success. CBI was kept clamped.   Given concern for large bladder clot obstructing the hematuria catheter, I ordered a stat bladder ultrasound for further evaluation. Depending on findings, patient may need another procedure for clot evacuation.

## 2020-06-29 NOTE — Progress Notes (Signed)
OT Cancellation Note  Patient Details Name: Langley Ingalls MRN: [id number] DOB: November 23, 1950   Cancelled Treatment:    Reason Eval/Treat Not Completed: Medical issues which prohibited therapy;Other (comment) per RN pt still drowsy from anesthesia, will check back as time allows for OT session.   Corinne Ports K., COTA/L Acute Rehabilitation Services [phone]   Precious Haws 06/29/2020, 8:20 AM

## 2020-06-29 NOTE — Transfer of Care (Signed)
Immediate Anesthesia Transfer of Care Note  Patient: [name]  Procedure(s) Performed: CYSTOSCOPY WITH CLOT EVACUATION AND  FULGERATION (N/A Bladder)  Patient Location: PACU  Anesthesia Type:General  Level of Consciousness: sedated, drowsy and responds to stimulation, opens eyes and tracks to name  Airway & Oxygen Therapy: Patient Spontanous Breathing  Post-op Assessment: Report given to RN and Post -op Vital signs reviewed and stable  Post vital signs: Reviewed and stable  Last Vitals:  Vitals Value Taken Time  BP 140/67 06/29/20 0538  Temp    Pulse 81 06/29/20 0539  Resp 11 06/29/20 0539  SpO2 100 % 06/29/20 0539  Vitals shown include unvalidated device data.  Last Pain:  Vitals:   06/29/20 0350  TempSrc: Oral  PainSc: Asleep      Patients Stated Pain Goal: 0 (64/35/39 1225)  Complications: No complications documented.

## 2020-06-29 NOTE — Plan of Care (Signed)

## 2020-06-29 NOTE — Progress Notes (Signed)
1 Day Post-Op Subjective: Minimal UOP via foley after cysto, clot evacuation, TURP, and fulguration yesterday afternoon. 20 Fr foley catheter clotted off and unable to irrigate this evening. Switched for a 24 Fr 3-way hematuria catheter by urology overnight, but unable to irrigate hematuria catheter. Pelvic ultrasound showed ~9cm large bladder clot. Recent bladder scan with ~220-240cc volume. Patient drowsy/confused after multiple doses of dilaudid for associated pain.   Objective: Vital signs in last 24 hours: Temp:  [97 F (36.1 C)-99.1 F (37.3 C)] 99.1 F (37.3 C) (03/24 0017) Pulse Rate:  [80-93] 93 (03/24 0017) Resp:  [11-18] 15 (03/24 0017) BP: (102-171)/(60-99) 147/64 (03/24 0017) SpO2:  [96 %-100 %] 96 % (03/24 0017) Weight:  [88 kg-94.4 kg] 88 kg (03/23 1126)  Intake/Output from previous day: 03/23 0701 - 03/24 0700 In: 250 [I.V.:250] Out: 3000  Intake/Output this shift: No intake/output data recorded.  Physical Exam:  Constitutional: Drowsy/confused. Laying in bed in NAD Cardiovascular: Regular rate Pulmonary/Chest: NWOB on RA GU: 3-way 24 Fr hematuria catheter in place with scant output of cherry urine. CBI connected but clamped    Lab Results: Recent Labs    06/27/20 0145 06/28/20 0131 06/28/20 1516  HGB 7.9* 8.3* 9.5*  HCT 24.4* 26.9* 28.0*   BMET Recent Labs    06/27/20 0145 06/28/20 0131 06/28/20 1516  NA 141 139 138  K 4.6 5.3* 4.3  CL 104 104 100  CO2 29 26  --   GLUCOSE 170* 174* 139*  BUN 35* 51* 21  CREATININE 3.94* 4.83* 2.80*  CALCIUM 8.8* 9.1  --    No results for input(s): LABPT, INR in the last 72 hours. No results for input(s): LABURIN in the last 72 hours. Results for orders placed or performed during the hospital encounter of 06/16/20  SARS CORONAVIRUS 2 (TAT 6-24 HRS) Nasopharyngeal Nasopharyngeal Swab     Status: None   Collection Time: 06/16/20  7:12 PM   Specimen: Nasopharyngeal Swab  Result Value Ref Range Status   SARS  Coronavirus 2 NEGATIVE NEGATIVE Final    Comment: (NOTE) SARS-CoV-2 target nucleic acids are NOT DETECTED.  The SARS-CoV-2 RNA is generally detectable in upper and lower respiratory specimens during the acute phase of infection. Negative results do not preclude SARS-CoV-2 infection, do not rule out co-infections with other pathogens, and should not be used as the sole basis for treatment or other patient management decisions. Negative results must be combined with clinical observations, patient history, and epidemiological information. The expected result is Negative.  Fact Sheet for Patients: SugarRoll.be  Fact Sheet for Healthcare Providers: https://www.woods-mathews.com/  This test is not yet approved or cleared by the [place] FDA and  has been authorized for detection and/or diagnosis of SARS-CoV-2 by FDA under an Emergency Use Authorization (EUA). This EUA will remain  in effect (meaning this test can be used) for the duration of the COVID-19 declaration under Se ction 564(b)(1) of the Act, 21 U.S.C. section 360bbb-3(b)(1), unless the authorization is terminated or revoked sooner.  Performed at [facility], [place] [address], [place], [place] [address]     Studies/Results: US PELVIS LIMITED (TRANSABDOMINAL ONLY)  Result Date: 06/29/2020 CLINICAL DATA:  70 year old male with clot within the urinary bladder. EXAM: LIMITED ULTRASOUND OF PELVIS TECHNIQUE: Limited transabdominal ultrasound examination of the pelvis was performed. COMPARISON:  Ultrasound dated 06/25/2020. FINDINGS: There is a 7.1 x 7.5 x 8.8 cm rounded heterogeneous echogenic mass with no internal vascularity is noted within the urinary bladder. This  is most consistent with a blood clot. Clinical correlation and follow-up recommended. IMPRESSION: Large blood clot within the urinary bladder. Electronically Signed   By: Anner Crete M.D.   On: 06/29/2020 01:50    I reviewed ultrasound images.  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  Unfortunately, this has been resistant to treatment, both cystoscopy, clot evacuation, cauterization with continuous bladder irrigation, alum irrigation, and transurethral resection of hypervascular median prostatic lobe. Unfortunately, he reformed a large bladder clot after his procedure yesterday that can't be removed by bedside irrigation of his hematuria catheter. Thus, we recommend urgent cystourethroscopy, clot evacuation, and fulguration overnight to prevent further bladder distention. We will resume continuous bladder irrigation after the procedure to prevent reformation of clot. We will slowly titate his CBI off.   Per patient's RN, she doesn't believe he has eaten during her shift since 7pm. Patient is unable to say when he last ate. Given the urgent nature of this case, we feel that it is necessary to proceed overnight.   Given that the patient was drowsy/confused after multiple doses of dilaudid for pain, he cannot give informed consent. I discussed the procedure with the patient as well as his wife over the phone. I discussed procedural risks, benefits, and alternatives with his wife, who provided informed verbal consent for the procedure over the phone.   If this fails, he will probably eventually need eventual formalin placement in the bladder.  We do not think he is a candidate for cystectomy.   LOS: 13 days   Celene Squibb 06/29/2020, 3:07 AM

## 2020-06-29 NOTE — Progress Notes (Signed)
Pt received from PACU . Drowsy, opens his eyes to voice. Vitals stable. Call bell within reach. Bed alarm on. CBI ongoing with clear yellow drainage in the foley bag. Will continue to monitor.

## 2020-06-29 NOTE — Progress Notes (Signed)
2244: on call urology MD [place] and notified of pt's ongoing situation. Notified pt is having spasms and urine is not dranning post foley flush.MD said to scan the bladder., if its >than 75 cc, keep trying to flush, if <75 cc treat as spasm. Bladder scan showed 177 cc. Flushed foley cath with 50 cc NS, pt would scream every time while flushing. Next attempt 40 cc NS flushed in and only 20cc came out with resistance when tried to flush any more. 2315Hulen Skains MD back again. MD stated try to advance foley in and try flushing it again. Called charge nurse by bedside. Foley advanced per charge RN, tried to flush but it was  Leaking around the insertion site, foley was still not dranning. Repeated the process one more time but still was leaking around the cathter. When scan the bladder, it was 244 cc. Pt stated he feels pressure to urinate.  2346: called back MD again, updated on what happened. MD asked to order cystoscopy trey from OR and call her once its there.  2351: This RN bought the trey from [place], MD notified.  0018: MD by bedside. Will continue to monitor.

## 2020-06-29 NOTE — Anesthesia Procedure Notes (Signed)
Procedure Name: LMA Insertion Date/Time: 06/29/2020 4:39 AM Performed by: Suzy Bouchard, CRNA Pre-anesthesia Checklist: Emergency Drugs available, Patient identified, Suction available, Patient being monitored and Timeout performed Patient Re-evaluated:Patient Re-evaluated prior to induction Oxygen Delivery Method: Circle system utilized Preoxygenation: Pre-oxygenation with 100% oxygen Induction Type: IV induction Ventilation: Mask ventilation without difficulty LMA: LMA inserted LMA Size: 5.0 Number of attempts: 1 Placement Confirmation: positive ETCO2 and breath sounds checked- equal and bilateral Tube secured with: Tape Dental Injury: Teeth and Oropharynx as per pre-operative assessment

## 2020-06-29 NOTE — Care Management Important Message (Signed)
Important Message  Patient Details  Name: [name] MRN: [id number] Date of Birth: 07/27/1950   Medicare Important Message Given:  Yes     [name] 06/29/2020, 8:37 AM

## 2020-06-29 NOTE — Progress Notes (Signed)
Patient has been drowsy throughout the day after returning from his procedure this morning. Vital signs have been stable and Bladder irrigation has been running slow and clear. Irrigation was stopped per physician request at beside. Patient requests to get out of bed but when attempting he was unable to stand. Physician aware and patient agrees to remain bedrest until he is rested and recovered from anesthetic effects. Will continue to monitor closely.   -Donnelly Angelica, RN

## 2020-06-29 NOTE — Interval H&P Note (Signed)
History and Physical Interval Note:  06/29/2020 3:20 AM  [name]  has presented today for surgery, with the diagnosis of PERSISTENT HEMATURIA.  The various methods of treatment have been discussed with the patient and family. After consideration of risks, benefits and other options for treatment, the patient's wife has consented to  Procedure(s) with comments: CYSTOSCOPY WITH CLOT EVACUATION AND FULGERATION OF BLEEDERS (N/A) - 1 HR TRANSURETHRAL RESECTION OF THE PROSTATE (TURP) (N/A) as a surgical intervention. The patient was unable to consent given that he was drowsy/confused after multiple doses of dilaudid. The patient's history has been reviewed, patient examined, no change in status, stable for surgery.  I have reviewed the patient's chart and labs.  Questions were answered to the patient's and his wife's satisfaction.     Celene Squibb

## 2020-06-29 NOTE — Progress Notes (Signed)
Paged on call Physician T Opoyd, pt was screaming in pain asking for morphine. There wa note from pharmacy stating "Recommend alternative to morphine for severe pain as morphine is not preferred in HD patients due to toxic metabolites." Asked MD if he would like to change, MD changed for diluadid 0.5 mg iv. See MAR for meds administration. Will continue to monitor.

## 2020-06-29 NOTE — Op Note (Signed)
Preoperative diagnosis: Recurrent gross hematuria/clot retention  Postoperative diagnosis: Same  Principal procedure: Cystoscopy, clot evacuation, fulguration of prostate/bladder  Surgeon: Dahlstedt  First Assistant: Celene Squibb, MD  Anesthesia: General with LMA  Specimen: Clot obtained, discarded  Estimated blood loss: Less than 25 mL during procedure, estimated clot volume 250 mL  Drains: 22 French hematuria 3-way catheter  Indications: 70 year old male with a 12-day history of gross hematuria.  He had an initial cystoscopy and clot evacuation/fulguration on March 12.  He subsequently had recurrent bleeding with eventual alum irrigation.  I took him back to the operating room yesterday because of persistent gross hematuria and a clot within the bladder.  A small amount of median lobe was resected.  There was no significant bleeding at the time of completion of the procedure, but since that time he has had worsening hematuria, clot retention and significant pain.  He comes back to the OR for repeat clot evacuation/fulguration.  Findings: There was a large clot within the bladder.  There no significant bleeding from the prostatic urethra or the bladder.  There were normal postoperative findings within the prostatic urethra following recent fulguration x2.  Description of procedure: The patient was seen in the preoperative area.  Consent was obtained from the patient's wife as the patient has been on narcotics.  He was taken to the operating room where general anesthetic was administered with LMA.  Is placed in the dorsolithotomy position.  Genitalia and perineum were prepped, draped, timeout performed.  Urethral meatus dilated and the 26 French resectoscope sheath was passed using the visual obturator.  This was passed into the bladder where a large clot was seen.  Vigorous irrigation was used to evacuate the clot.  Following this, inspection was carried out.  There was no significant  active bleeding.  Loop was used to apply monopolar cautery to the prostatic urethra circumferentially.  Posterior bladder wall where there has been catheter related trauma was cauterized.  Several tiny areas in the trigonal region were cauterized.  All visible clot had been removed from the bladder.  Careful inspection with the irrigation off, under low pressure revealed no significant bleeding following the above-mentioned cautery.  At this point, the scope was removed.  47 [place] three-way hematuria catheter was then placed, balloon filled with water.  This was hooked to CBI.  CBI, on slow drip was crystal-clear.  At this point, the patient was awakened and taken the PACU in stable condition.  He tolerated the procedure well

## 2020-06-29 NOTE — Progress Notes (Signed)
called on call physician Dr. Darene Lamer opoyd again, asked if I could flush the cathter since there is no output and pt keeps screaming with pain , visible blood clots in the tubing. MD stated go head and flush it.  Flushed catheter with 50 cc NSx 2. blood clots removed x 2. Urine bag changed. But urine was still not flowing through the tube. When asked pt said he does not urinate much, but there has been appox 5 cc urine in the bag since my shift started.  2231: called MD back gain and updated . Asked if he needs a CBI again, but pt does not have 3 way cathter.MD said call urology and go from there. In the mean time requested to change Dilaudid to 1 mg since 0.5 mg did not help much. See mar for new orders and medication administration. Will continue to monitor.

## 2020-06-29 NOTE — Anesthesia Preprocedure Evaluation (Addendum)
Anesthesia Evaluation  Patient identified by MRN, date of birth, ID band Patient awake    Reviewed: Allergy & Precautions, H&P , NPO status , Patient's Chart, lab work & pertinent test results  History of Anesthesia Complications (+) PONV and history of anesthetic complications  Airway Mallampati: II  TM Distance: >3 FB Neck ROM: Full    Dental  (+) Teeth Intact, Dental Advisory Given   Pulmonary neg COPD, neg recent URI, former smoker,  Covid-19 Nucleic Acid Test Results Lab Results      Component                Value               Date                      SARSCOV2NAA              NEGATIVE            06/16/2020                [place]              NEGATIVE            04/04/2020                [place]              NEGATIVE            01/07/2020                [place]              NEGATIVE            08/26/2019                [place]              NEGATIVE            05/18/2019                [place]                 NEGATIVE            04/17/2019                [place]                 NEGATIVE            04/17/2019              breath sounds clear to auscultation       Cardiovascular hypertension, Pt. on medications and Pt. on home beta blockers + CAD, + CABG and + Peripheral Vascular Disease   Rhythm:Regular     Neuro/Psych negative neurological ROS  negative psych ROS   GI/Hepatic Neg liver ROS, GERD  Medicated,  Endo/Other  diabetesHypothyroidism Lab Results      Component                Value               Date                      HGBA1C                   5.7 (H)             06/16/2020  Renal/GU Dialysis and ESRFRenal disease   bladder clot    Musculoskeletal  (+) Arthritis , Osteoarthritis,    Abdominal   Peds  Hematology  (+) Blood dyscrasia, anemia , Lab Results      Component                Value               Date                      WBC                      7.6                  06/28/2020                HGB                      9.5 (L)             06/28/2020                HCT                      28.0 (L)            06/28/2020                MCV                      88.2                06/28/2020                PLT                      156                 06/28/2020              Anesthesia Other Findings   Reproductive/Obstetrics negative OB ROS                           Anesthesia Physical Anesthesia Plan  ASA: III  Anesthesia Plan: General   Post-op Pain Management:    Induction: Intravenous  PONV Risk Score and Plan: 3 and Treatment may vary due to age or medical condition and Ondansetron  Airway Management Planned: LMA  Additional Equipment: None  Intra-op Plan:   Post-operative Plan: Extubation in OR  Informed Consent: I have reviewed the patients History and Physical, chart, labs and discussed the procedure including the risks, benefits and alternatives for the proposed anesthesia with the patient or authorized representative who has indicated his/her understanding and acceptance.     Dental advisory given  Plan Discussed with: CRNA and Surgeon  Anesthesia Plan Comments:         Anesthesia Quick Evaluation

## 2020-06-29 NOTE — Progress Notes (Signed)
Nutrition Follow Up  DOCUMENTATION CODES:   Not applicable  INTERVENTION:    Double protein portions   Magic cup BID with meals, each supplement provides 290 kcal and 9 grams of protein  Renal MVI daily   NUTRITION DIAGNOSIS:   Increased nutrient needs related to chronic illness (ESRD on HD) as evidenced by estimated needs.  Ongoing  GOAL:   Patient will meet greater than or equal to 90% of their needs   Progressing   MONITOR:   PO intake,Supplement acceptance,Labs,Weight trends,I & O's  REASON FOR ASSESSMENT:   Consult Other (Comment) ("nutritional goals")  ASSESSMENT:   Pt admitted with gross hematuria, likely 2/2 radiation cystitis. PMH includes prostate cancer, hypothyroidism, HTN, HLD, DM, and ESRD on HD.  3/12 s/p cystoscopy, clot evacuation   3/23 s/p cystoscopy, clot evacuation, fulguration of bladder neck/prostatic bleeders, TURP  Patient still bleeding after procedure yesterday. Taken back to OR again this am for clot evacuation.   NPO yesterday and today for procedures. Meal completions charted as 15-80% for his last 5 meals. Taking Ensure off/on, transition to double protein portions. Of note, patient does not like Nepro shakes.   EDW: 85 kg  Current weight: 88 kg   Medications: calcitriol, aranesp, colace, SS novolog, lokelma Labs: K 5.5 (H) CBG 135-262  Diet Order:   Diet Order            Diet regular Room service appropriate? Yes with Assist; Fluid consistency: Thin  Diet effective now                 EDUCATION NEEDS:   No education needs have been identified at this time  Skin:  Skin Assessment: Skin Integrity Issues: Skin Integrity Issues:: Incisions,Stage II Stage II: coccyx Incisions: perineum  Last BM:  3/21  Height:   Ht Readings from Last 1 Encounters:  06/28/20 5\' 10"  (1.778 m)    Weight:   Wt Readings from Last 1 Encounters:  06/28/20 88 kg    BMI:  Body mass index is 27.84 kg/m.  Estimated Nutritional  Needs:   Kcal:  2876-8115  Protein:  115-130 grams  Fluid:  1L+UOP  [name] RD, LDN Clinical Nutrition Pager listed in [place]

## 2020-06-29 NOTE — Progress Notes (Signed)
Report called to Anesthesia Estill Bamberg in [place]. Escorted pt via bed, vitals stable at the time of transport. CCMD called and notified. Will reaccess when pt comes back on the floor.

## 2020-06-30 ENCOUNTER — Encounter (HOSPITAL_COMMUNITY): Payer: Self-pay | Admitting: Urology

## 2020-06-30 LAB — CBC
HCT: 23.3 % — ABNORMAL LOW (ref 39.0–52.0)
Hemoglobin: 7.3 g/dL — ABNORMAL LOW (ref 13.0–17.0)
MCH: 27.5 pg (ref 26.0–34.0)
MCHC: 31.3 g/dL (ref 30.0–36.0)
MCV: 87.9 fL (ref 80.0–100.0)
Platelets: 153 10*3/uL (ref 150–400)
RBC: 2.65 MIL/uL — ABNORMAL LOW (ref 4.22–5.81)
RDW: 16.9 % — ABNORMAL HIGH (ref 11.5–15.5)
WBC: 8.1 10*3/uL (ref 4.0–10.5)
nRBC: 0.2 % (ref 0.0–0.2)

## 2020-06-30 LAB — SURGICAL PATHOLOGY

## 2020-06-30 LAB — GLUCOSE, CAPILLARY
Glucose-Capillary: 146 mg/dL — ABNORMAL HIGH (ref 70–99)
Glucose-Capillary: 297 mg/dL — ABNORMAL HIGH (ref 70–99)
Glucose-Capillary: 196 mg/dL — ABNORMAL HIGH (ref 70–99)

## 2020-06-30 LAB — BASIC METABOLIC PANEL
Anion gap: 11 (ref 5–15)
Anion gap: 10 (ref 5–15)
BUN: 32 mg/dL — ABNORMAL HIGH (ref 8–23)
BUN: 50 mg/dL — ABNORMAL HIGH (ref 8–23)
CO2: 26 mmol/L (ref 22–32)
CO2: 27 mmol/L (ref 22–32)
Calcium: 9.2 mg/dL (ref 8.9–10.3)
Calcium: 8.8 mg/dL — ABNORMAL LOW (ref 8.9–10.3)
Chloride: 101 mmol/L (ref 98–111)
Chloride: 101 mmol/L (ref 98–111)
Creatinine, Ser: 4.01 mg/dL — ABNORMAL HIGH (ref 0.61–1.24)
Creatinine, Ser: 5.82 mg/dL — ABNORMAL HIGH (ref 0.61–1.24)
GFR, Estimated: 15 mL/min — ABNORMAL LOW (ref >60)
GFR, Estimated: 10 mL/min — ABNORMAL LOW (ref >60)
Glucose, Bld: 258 mg/dL — ABNORMAL HIGH (ref 70–99)
Glucose, Bld: 189 mg/dL — ABNORMAL HIGH (ref 70–99)
Potassium: 5.5 mmol/L — ABNORMAL HIGH (ref 3.5–5.1)
Potassium: 5.2 mmol/L — ABNORMAL HIGH (ref 3.5–5.1)
Sodium: 138 mmol/L (ref 135–145)
Sodium: 138 mmol/L (ref 135–145)

## 2020-06-30 LAB — HEMOGLOBIN AND HEMATOCRIT, BLOOD
HCT: 24 % — ABNORMAL LOW (ref 39.0–52.0)
Hemoglobin: 7.5 g/dL — ABNORMAL LOW (ref 13.0–17.0)

## 2020-06-30 NOTE — Progress Notes (Signed)
Foley removed per provider order for voiding trial.  Patient tolerated procedure well.  Monitor urine output.

## 2020-06-30 NOTE — Progress Notes (Signed)
Mobility Specialist: Progress Note   06/30/20 1647  Mobility  Activity Ambulated to bathroom;Ambulated in hall  Level of Assistance Contact guard assist, steadying assist  Assistive Device Four wheel walker  Distance Ambulated (ft) 60 ft  Mobility Response Tolerated well  Mobility performed by Mobility specialist  $Mobility charge 1 Mobility   Pt to BR before ambulation. Told pt to pull call string on wall when he was done, RN and NT notified. Pt back in bed with help from RN upon return. Pt agreeable to go on walk still. Pt asx during ambulation. Pt sitting EOB with family present in room.   [facility] Day Mobility Specialist Mobility Specialist Phone: [phone]

## 2020-06-30 NOTE — Progress Notes (Signed)
[facility] Progress Note  Subjective: seen in room. K+ 5.3 today. Not much bladder pain today. Feeling better.   Vitals:   06/30/20 1300 06/30/20 1330 06/30/20 1343 06/30/20 1345  BP: (!) 91/57 (!) 83/46 (!) 99/58 (!) 121/49  Pulse: 97 (!) 9  96  Resp:    16  Temp:    98.6 F (37 C)  TempSrc:    Oral  SpO2:    100%  Weight:    83.8 kg  Height:        Exam:   alert, nad   no jvd  Chest cta bilat  Cor reg no RG  Abd soft ntnd no ascites   Ext no LE edema   Alert, NF, ox3   L AVF +bruit    OP HD:  HomeHD NxStage MTWF EDW 85kg No heparin  - last esa was mircera 150 ug in Nov 2021, none since  Assessment/Plan: 1. Radiation-induced hemorrhagic cystitis - hx prostate ca s/p radiation therapy. Urology following, s/pcystoscopy/clot removalin OR 3/12. Also has had cauterization, alum irrigation but still bleeding. Went back to OR 3/23 for clot evac + fulguration of bleeders and TURP. Had to go back again 3/24 early AM for pain > more clot was evacuated but no new bleeders noted. Looks better today. Per Urology.  2. ESRD - Home HD. Continue MWF 4h each while here.  No heparin w/ HD. HD today.   3. Hypertension/volume - BPsl elevated. Continue home meds - Coreg, Losartan, hydralazine. Wt's are better, under dry wt 1-2kg post HD today.  4. Anemia - not on ESA as outpatient. Restarted darbepoetin 200 mcg q Wednesday here (rec'd 3/16, 3/23). Next dose 3/30. Transfuse as needed. SP total 4u pRBCs this admit. Hb 8-9 range now.  5. Metabolic bone disease - OPCa/Phos at goal. Continue home calcitriol. 6. DM: per primary 7. H/o osteomyelitis   Rob Schertz 06/30/2020, 4:32 PM   Recent Labs  Lab 06/29/20 0149 06/29/20 0706 06/30/20 0142 06/30/20 1458  K 5.5*  --  5.2*  --   BUN 32*  --  50*  --   CREATININE 4.01*  --  5.82*  --   CALCIUM 9.2  --  8.8*  --   HGB  --    < > 7.3* 7.5*   < > = values in this interval not displayed.   Inpatient  medications: . allopurinol  200 mg Oral Daily  . calcitRIOL  0.5 mcg Oral Daily  . carvedilol  12.5 mg Oral BID WC  . chlorhexidine  15 mL Mouth Rinse Daily  . Chlorhexidine Gluconate Cloth  6 each Topical Q0600  . darbepoetin (ARANESP) injection - DIALYSIS  200 mcg Intravenous Q Wed-HD  . docusate sodium  100 mg Oral BID  . insulin aspart  0-6 Units Subcutaneous TID WC  . levothyroxine  112 mcg Oral QAC breakfast  . losartan  100 mg Oral Daily  . multivitamin  1 tablet Oral QHS  . pantoprazole  40 mg Oral Daily  . pregabalin  150 mg Oral BID  . rosuvastatin  10 mg Oral Daily  . tamsulosin  0.4 mg Oral Daily   . sodium chloride irrigation 3,000 mL (06/21/20 2233)   acetaminophen **OR** acetaminophen, calcium carbonate (dosed in mg elemental calcium), camphor-menthol **AND** hydrOXYzine, docusate sodium, hydrALAZINE, HYDROmorphone (DILAUDID) injection, ondansetron **OR** ondansetron (ZOFRAN) IV, oxyCODONE-acetaminophen, polyvinyl alcohol, sorbitol

## 2020-06-30 NOTE — Progress Notes (Signed)
OT Cancellation Note  Patient Details Name: [name] MRN: [id number] DOB: 03-Feb-1951   Cancelled Treatment:    Reason Eval/Treat Not Completed: Patient at procedure or test/ unavailable.  Off the floor for HD, OT to see as appropriate.    Richard D Popella 06/30/2020, 1:10 PM

## 2020-06-30 NOTE — Progress Notes (Signed)
PROGRESS NOTE    [name]  ZDG:[id number] DOB: 1950-08-27 DOA: 06/16/2020 PCP: Lorenda Hatchet, FNP   Chief Complaint  Patient presents with  . Hematuria  Brief Narrative: 70 year old male with history of prostate cancer hypothyroidism, hypertension, hyperlipidemia, diabetes mellitus ESRD on HD presented with hematuria for a few days.  He was admitted seen by urology recently as outpatient but hematuria had gotten worse so was brought to the hospital.  He underwent Foley catheter placement and three-way irrigation.  Patient continued to have large hematuria with clots underwent cystoscopic evaluation with fulguration on 06/17/2020, hematuria persisted needing blood transfusion and continued on CBI.  Urology following closely.  Patient again cystourethroscopy, clot evacuation and fulguration 3/24.  Subjective:  No more hematuria on foley and has been discontinued this morning Patient is more alert and has no and complaints. Nursing at the bedside.  Assessment & Plan:  Gross hematuria: Secondary to radiation cystitis. He was on CBI >underwent cystoscopy with fulguration 3/12 with ongoing hematuria-patient had repeat x 2 cystoscopy post-dialysis 3/23 and overnight 3/24 due to recurrent hematuria/difficult to pass Foley.Hematuria has now resolved.  Voiding trial started.  Anemia of due to acute blood loss from gross hematuria and chronic renal disease not on ESA as an outpatient placed on darbepoetin qwednesday.  s/p 4 units PRBC this admission.  Hemoglobin downtrending.  Monitor and transfuse if less than 7 g.  Recent Labs  Lab 06/27/20 0145 06/28/20 0131 06/28/20 1516 06/29/20 0706 06/30/20 0142  HGB 7.9* 8.3* 9.5* 8.5* 7.3*  HCT 24.4* 26.9* 28.0* 26.7* 23.3*   History of prostate cancer status post radiation in 2018  ESRD on HD MWF, status post HD 3/23-nephrology on board for dialysis.    Hypertension/volume blood blood pressure is fairly controlled.  Continue current Coreg  losartan hydralazine.    Lethargy sleepiness: Status post procedure OVERNIGHT 3/23.  Metabolic bone disease continue PhosLo and calcitriol.  Calcium at goal.  Diabetes mellitus, controlled hemoglobin A1c 5.7, blood sugar is controlled On sliding scale insulin.  Recent Labs  Lab 06/29/20 0656 06/29/20 1132 06/29/20 1656 06/29/20 2107 06/30/20 0611  GLUCAP 151* 135* 220* 180* 146*   Hyperkalemia potassium at 5.2 status post Lokelma.  Management per nephrology-plan is for dialysis today Recent Labs  Lab 06/27/20 0145 06/28/20 0131 06/28/20 1516 06/29/20 0149 06/30/20 0142  K 4.6 5.3* 4.3 5.5* 5.2*   Hypercholesteremia: continue Crestor  Hypothyroidism TSH stable 4.8 continue home Synthroid  Overweight with BMI 27.8.  Deconditioning PT OT follow-up will be needed, discussed with nursing staff.  Diet Order            Diet regular Room service appropriate? Yes; Fluid consistency: Thin  Diet effective now                 Nutrition Problem: Increased nutrient needs Etiology: chronic illness (ESRD on HD) Signs/Symptoms: estimated needs Interventions: MVI,Prostat,Nepro shake Patient's Body mass index is 27.71 kg/m.  DVT prophylaxis: SCDs Start: 06/16/20 1734 Code Status:   Code Status: Full Code  Family Communication: plan of care discussed with patient at bedside and nursing staff and nephrology. I had spoken to patient's wife and updated 3/24  Status is: Inpatient Remains inpatient appropriate because:Inpatient level of care appropriate due to severity of illness Dispo: The patient is from: Home with wife              Anticipated d/c is to: TBD-awaiting further PT OT evaluation as he appears more deconditioned  Patient currently is not medically stable to d/c.   Difficult to place patient No  Unresulted Labs (From admission, onward)          Start     Ordered   06/30/20 0500  CBC  Daily,   R     Question:  Specimen collection method  Answer:   Lab=Lab collect   06/29/20 4196        Medications reviewed:  Scheduled Meds: . allopurinol  200 mg Oral Daily  . calcitRIOL  0.5 mcg Oral Daily  . carvedilol  12.5 mg Oral BID WC  . chlorhexidine  15 mL Mouth Rinse Daily  . Chlorhexidine Gluconate Cloth  6 each Topical Q0600  . darbepoetin (ARANESP) injection - DIALYSIS  200 mcg Intravenous Q Wed-HD  . docusate sodium  100 mg Oral BID  . insulin aspart  0-6 Units Subcutaneous TID WC  . levothyroxine  112 mcg Oral QAC breakfast  . losartan  100 mg Oral Daily  . multivitamin  1 tablet Oral QHS  . pantoprazole  40 mg Oral Daily  . pregabalin  150 mg Oral BID  . rosuvastatin  10 mg Oral Daily  . tamsulosin  0.4 mg Oral Daily   Continuous Infusions: . sodium chloride irrigation 3,000 mL (06/21/20 2233)    Consultants:see note  Procedures:see note  Antimicrobials: Anti-infectives (From admission, onward)   Start     Dose/Rate Route Frequency Ordered Stop   06/28/20 1500  ceFAZolin (ANCEF) IVPB 2g/100 mL premix  Status:  Discontinued        2 g 200 mL/hr over 30 Minutes Intravenous To ShortStay Procedural 06/27/20 0928 06/28/20 0814   06/28/20 1115  ceFAZolin (ANCEF) IVPB 2g/100 mL premix        2 g 200 mL/hr over 30 Minutes Intravenous To ShortStay Procedural 06/28/20 0814 06/28/20 1114   06/16/20 1900  cefTRIAXone (ROCEPHIN) 1 g in sodium chloride 0.9 % 100 mL IVPB  Status:  Discontinued        1 g 200 mL/hr over 30 Minutes Intravenous Every 24 hours 06/16/20 1855 06/20/20 0606     Culture/Microbiology    Component Value Date/Time   SDES URINE, RANDOM 06/15/2020 1159   SPECREQUEST NONE 06/15/2020 1159   CULT  06/15/2020 1159    NO GROWTH Performed at [facility], [place] [address], [place], [place] [address]    REPTSTATUS 06/16/2020 FINAL 06/15/2020 1159    Other culture-see note  Objective: Vitals: Today's Vitals   06/30/20 0300 06/30/20 0415 06/30/20 0500 06/30/20 0742  BP: (!) 132/57 (!) 132/57  (!)  154/73  Pulse: 85 86  89  Resp: 20 16  18   Temp: 98.2 F (36.8 C) 98.2 F (36.8 C)  98.4 F (36.9 C)  TempSrc: Oral Oral  Oral  SpO2: 100% 94%  99%  Weight:   87.6 kg   Height:      PainSc:  0-No pain      Intake/Output Summary (Last 24 hours) at 06/30/2020 0747 Last data filed at 06/30/2020 0400 Gross per 24 hour  Intake 360 ml  Output 3150 ml  Net -2790 ml   Filed Weights   06/28/20 0715 06/28/20 1126 06/30/20 0500  Weight: 94.4 kg 88 kg 87.6 kg   Weight change: -6.8 kg  Intake/Output from previous day: 03/24 0701 - 03/25 0700 In: 360 [P.O.:360] Out: 3150 [Urine:3150] Intake/Output this shift: No intake/output data recorded. Filed Weights   06/28/20 0715 06/28/20 1126 06/30/20 0500  Weight: 94.4 kg 88 kg 87.6 kg    Examination: General exam: Alert awake appears older than stated age, not in acute distress  HEENT:Oral mucosa moist, Ear/Nose WNL grossly, dentition normal. Respiratory system: bilaterally diminishedd,no wheezing or crackles,no use of accessory muscle Cardiovascular system: S1 & S2 +, No JVD,. Gastrointestinal system: Abdomen soft, NT,ND, BS+ Nervous System:Alert, awake, moving extremities and grossly nonfocal Extremities: No edema, distal peripheral pulses palpable.  Skin: No rashes,no icterus. MSK: Normal muscle bulk,tone, power  Data Reviewed: I have personally reviewed following labs and imaging studies CBC: Recent Labs  Lab 06/25/20 0130 06/25/20 0401 06/26/20 0147 06/27/20 0145 06/28/20 0131 06/28/20 1516 06/29/20 0706 06/30/20 0142  WBC 5.6  --  6.8 7.0 7.6  --  11.0* 8.1  NEUTROABS 4.1  --  5.1  --   --   --   --   --   HGB 6.3*   < > 7.8* 7.9* 8.3* 9.5* 8.5* 7.3*  HCT 19.9*   < > 25.2* 24.4* 26.9* 28.0* 26.7* 23.3*  MCV 88.8  --  88.7 86.8 88.2  --  87.0 87.9  PLT 145*  --  138* 154 156  --  180 153   < > = values in this interval not displayed.   Basic Metabolic Panel: Recent Labs  Lab 06/24/20 0438 06/25/20 0130  06/26/20 0147 06/27/20 0145 06/28/20 0131 06/28/20 1516 06/29/20 0149 06/30/20 0142  NA 135   < > 138 141 139 138 138 138  K 4.7   < > 5.0 4.6 5.3* 4.3 5.5* 5.2*  CL 98   < > 104 104 104 100 101 101  CO2 28   < > 24 29 26   --  26 27  GLUCOSE 170*   < > 199* 170* 174* 139* 258* 189*  BUN 24*   < > 53* 35* 51* 21 32* 50*  CREATININE 3.20*   < > 5.39* 3.94* 4.83* 2.80* 4.01* 5.82*  CALCIUM 8.9   < > 8.8* 8.8* 9.1  --  9.2 8.8*  MG 1.9  --   --  1.8 2.0  --   --   --    < > = values in this interval not displayed.   GFR: Estimated Creatinine Clearance: 13.2 mL/min (A) (by C-G formula based on SCr of 5.82 mg/dL (H)). Liver Function Tests: No results for input(s): AST, ALT, ALKPHOS, BILITOT, PROT, ALBUMIN in the last 168 hours. No results for input(s): LIPASE, AMYLASE in the last 168 hours. No results for input(s): AMMONIA in the last 168 hours. Coagulation Profile: No results for input(s): INR, PROTIME in the last 168 hours. Cardiac Enzymes: No results for input(s): CKTOTAL, CKMB, CKMBINDEX, TROPONINI in the last 168 hours. BNP (last 3 results) No results for input(s): PROBNP in the last 8760 hours. HbA1C: No results for input(s): HGBA1C in the last 72 hours. CBG: Recent Labs  Lab 06/29/20 0656 06/29/20 1132 06/29/20 1656 06/29/20 2107 06/30/20 0611  GLUCAP 151* 135* 220* 180* 146*   Lipid Profile: No results for input(s): CHOL, HDL, LDLCALC, TRIG, CHOLHDL, LDLDIRECT in the last 72 hours. Thyroid Function Tests: No results for input(s): TSH, T4TOTAL, FREET4, T3FREE, THYROIDAB in the last 72 hours. Anemia Panel: No results for input(s): VITAMINB12, FOLATE, FERRITIN, TIBC, IRON, RETICCTPCT in the last 72 hours. Sepsis Labs: No results for input(s): PROCALCITON, LATICACIDVEN in the last 168 hours.  No results found for this or any previous visit (from the past 240 hour(s)).   Radiology Studies:  US PELVIS LIMITED (TRANSABDOMINAL ONLY)  Result Date: 06/29/2020 CLINICAL  DATA:  70 year old male with clot within the urinary bladder. EXAM: LIMITED ULTRASOUND OF PELVIS TECHNIQUE: Limited transabdominal ultrasound examination of the pelvis was performed. COMPARISON:  Ultrasound dated 06/25/2020. FINDINGS: There is a 7.1 x 7.5 x 8.8 cm rounded heterogeneous echogenic mass with no internal vascularity is noted within the urinary bladder. This is most consistent with a blood clot. Clinical correlation and follow-up recommended. IMPRESSION: Large blood clot within the urinary bladder. Electronically Signed   By: Anner Crete M.D.   On: 06/29/2020 01:50     LOS: 14 days   Antonieta Pert, MD [facility]  06/30/2020, 7:47 AM

## 2020-06-30 NOTE — Progress Notes (Addendum)
1 Day Post-Op Subjective: Urine clear yellow with CBI clamped since 6pm yesterday  Objective: Vital signs in last 24 hours: Temp:  [97.8 F (36.6 C)-98.7 F (37.1 C)] 98.2 F (36.8 C) (03/25 0415) Pulse Rate:  [79-88] 86 (03/25 0415) Resp:  [12-20] 16 (03/25 0415) BP: (127-145)/(57-74) 132/57 (03/25 0415) SpO2:  [94 %-100 %] 94 % (03/25 0415)  Intake/Output from previous day: 03/24 0701 - 03/25 0700 In: 360 [P.O.:360] Out: 3150 [Urine:3150] Intake/Output this shift: Total I/O In: 360 [P.O.:360] Out: 750 [Urine:750]  Physical Exam:  Constitutional: Sleeping. Laying in bed in NAD Cardiovascular: Regular rate Pulmonary/Chest: NWOB on RA GU: 3-way 22 Fr hematuria catheter in place draining clear yellow urine with CBI clamped    Lab Results: Recent Labs    06/28/20 1516 06/29/20 0706 06/30/20 0142  HGB 9.5* 8.5* 7.3*  HCT 28.0* 26.7* 23.3*   BMET Recent Labs    06/29/20 0149 06/30/20 0142  NA 138 138  K 5.5* 5.2*  CL 101 101  CO2 26 27  GLUCOSE 258* 189*  BUN 32* 50*  CREATININE 4.01* 5.82*  CALCIUM 9.2 8.8*   No results for input(s): LABPT, INR in the last 72 hours. No results for input(s): LABURIN in the last 72 hours. Results for orders placed or performed during the hospital encounter of 06/16/20  SARS CORONAVIRUS 2 (TAT 6-24 HRS) Nasopharyngeal Nasopharyngeal Swab     Status: None   Collection Time: 06/16/20  7:12 PM   Specimen: Nasopharyngeal Swab  Result Value Ref Range Status   SARS Coronavirus 2 NEGATIVE NEGATIVE Final    Comment: (NOTE) SARS-CoV-2 target nucleic acids are NOT DETECTED.  The SARS-CoV-2 RNA is generally detectable in upper and lower respiratory specimens during the acute phase of infection. Negative results do not preclude SARS-CoV-2 infection, do not rule out co-infections with other pathogens, and should not be used as the sole basis for treatment or other patient management decisions. Negative results must be combined with  clinical observations, patient history, and epidemiological information. The expected result is Negative.  Fact Sheet for Patients: SugarRoll.be  Fact Sheet for Healthcare Providers: https://www.woods-mathews.com/  This test is not yet approved or cleared by the [place] FDA and  has been authorized for detection and/or diagnosis of SARS-CoV-2 by FDA under an Emergency Use Authorization (EUA). This EUA will remain  in effect (meaning this test can be used) for the duration of the COVID-19 declaration under Se ction 564(b)(1) of the Act, 21 U.S.C. section 360bbb-3(b)(1), unless the authorization is terminated or revoked sooner.  Performed at [facility], [place] [address], [place], [place] [address]     Studies/Results: US PELVIS LIMITED (TRANSABDOMINAL ONLY)  Result Date: 06/29/2020 CLINICAL DATA:  70 year old male with clot within the urinary bladder. EXAM: LIMITED ULTRASOUND OF PELVIS TECHNIQUE: Limited transabdominal ultrasound examination of the pelvis was performed. COMPARISON:  Ultrasound dated 06/25/2020. FINDINGS: There is a 7.1 x 7.5 x 8.8 cm rounded heterogeneous echogenic mass with no internal vascularity is noted within the urinary bladder. This is most consistent with a blood clot. Clinical correlation and follow-up recommended. IMPRESSION: Large blood clot within the urinary bladder. Electronically Signed   By: Anner Crete M.D.   On: 06/29/2020 01:50   Assessment/Plan:   Gross hematuria from radiation cystitis.  Hematuria appears to have resolved after most recent cystourethroscopy, clot evacuation, and fulguration on 06/29/20. We recommend removal of foley catheter and trial of void this morning. Patient has been off bladder relaxants  LOS: 14 days   Celene Squibb 06/30/2020, 6:31 AM

## 2020-07-01 LAB — CBC
HCT: 23.2 % — ABNORMAL LOW (ref 39.0–52.0)
Hemoglobin: 7 g/dL — ABNORMAL LOW (ref 13.0–17.0)
MCH: 26.9 pg (ref 26.0–34.0)
MCHC: 30.2 g/dL (ref 30.0–36.0)
MCV: 89.2 fL (ref 80.0–100.0)
Platelets: 142 10*3/uL — ABNORMAL LOW (ref 150–400)
RBC: 2.6 MIL/uL — ABNORMAL LOW (ref 4.22–5.81)
RDW: 16.9 % — ABNORMAL HIGH (ref 11.5–15.5)
WBC: 7 10*3/uL (ref 4.0–10.5)
nRBC: 0 % (ref 0.0–0.2)

## 2020-07-01 LAB — GLUCOSE, CAPILLARY
Glucose-Capillary: 174 mg/dL — ABNORMAL HIGH (ref 70–99)
Glucose-Capillary: 161 mg/dL — ABNORMAL HIGH (ref 70–99)
Glucose-Capillary: 222 mg/dL — ABNORMAL HIGH (ref 70–99)

## 2020-07-01 LAB — PREPARE RBC (CROSSMATCH)

## 2020-07-01 MED ORDER — DOCUSATE SODIUM 100 MG PO CAPS: 100.0000 mg | ORAL_CAPSULE | Freq: Two times a day (BID) | ORAL | 0 refills | Status: DC

## 2020-07-01 MED ORDER — TAMSULOSIN HCL 0.4 MG PO CAPS: 0.4000 mg | ORAL_CAPSULE | Freq: Every day | ORAL | 0 refills | Status: AC

## 2020-07-01 MED ORDER — SODIUM CHLORIDE 0.9% IV SOLUTION: Freq: Once | INTRAVENOUS | Status: AC

## 2020-07-01 NOTE — Progress Notes (Signed)
PT Cancellation Note  Patient Details Name: [name] MRN: [id number] DOB: 1950-09-16   Cancelled Treatment:    Reason Eval/Treat Not Completed: Medical issues which prohibited therapy.  Pt is receiving blood and reports he is leaving afterward.  Follow up as time and pt allow.   Ramond Dial 07/01/2020, 1:39 PM   Mee Hives, PT MS Acute Rehab Dept. Number: [place]

## 2020-07-01 NOTE — Progress Notes (Signed)
Urology Inpatient Progress Report     Intv/Subj: No overnight events.  Pt Hgb 7.0 this AM and he is going to have transfusion.  He is voiding small amounts of dark pink urine.   Principal Problem:   Gross hematuria Active Problems:   Hypercholesteremia   HTN (hypertension)   Hypothyroidism   Diabetes mellitus ([place])   ESRD (end stage renal disease) ([place])   Pressure injury of skin  Current Facility-Administered Medications  Medication Dose Route Frequency Provider Last Rate Last Admin  . 0.9 %  sodium chloride infusion (Manually program via Guardrails IV Fluids)   Intravenous Once Kc, Ramesh, MD      . acetaminophen (TYLENOL) tablet 650 mg  650 mg Oral Q6H PRN [name], MD       Or  . acetaminophen (TYLENOL) suppository 650 mg  650 mg Rectal Q6H PRN [name], MD      . allopurinol (ZYLOPRIM) tablet 200 mg  200 mg Oral Daily [name], MD   200 mg at 06/30/20 1425  . calcitRIOL (ROCALTROL) capsule 0.5 mcg  0.5 mcg Oral Daily [name], MD   0.5 mcg at 06/30/20 1425  . calcium carbonate (dosed in mg elemental calcium) suspension 500 mg of elemental calcium  500 mg of elemental calcium Oral Q6H PRN [name], MD      . camphor-menthol [facility]) lotion 1 application  1 application Topical W1U PRN [name], MD       And  . hydrOXYzine (ATARAX/VISTARIL) tablet 25 mg  25 mg Oral Q8H PRN [name], MD      . carvedilol (COREG) tablet 12.5 mg  12.5 mg Oral BID WC [name], MD   12.5 mg at 06/30/20 1636  . chlorhexidine (PERIDEX) 0.12 % solution 15 mL  15 mL Mouth Rinse Daily [name], MD   15 mL at 06/30/20 1425  . Chlorhexidine Gluconate Cloth 2 % PADS 6 each  6 each Topical Q0600 [name], MD   6 each at 06/30/20 [phone]  . Darbepoetin Alfa (ARANESP) injection 200 mcg  200 mcg Intravenous Q Wed-HD [name], MD   200 mcg at 06/28/20 1041  . docusate sodium (COLACE) capsule 100 mg  100 mg Oral  BID [name], MD   100 mg at 06/30/20 1425  . docusate sodium (ENEMEEZ) enema 283 mg  1 enema Rectal PRN [name], MD      . hydrALAZINE (APRESOLINE) injection 5 mg  5 mg Intravenous Q4H PRN [name], MD   5 mg at 06/16/20 2220  . HYDROmorphone (DILAUDID) injection 1 mg  1 mg Intravenous Q4H PRN [name], MD   1 mg at 06/28/20 2335  . insulin aspart (novoLOG) injection 0-6 Units  0-6 Units Subcutaneous TID WC [name], MD   1 Units at 07/01/20 3664  . levothyroxine (SYNTHROID) tablet 112 mcg  112 mcg Oral QAC breakfast [name], MD   112 mcg at 07/01/20 [phone]  . losartan (COZAAR) tablet 100 mg  100 mg Oral Daily [name], MD   100 mg at 06/30/20 1425  . multivitamin (RENA-VIT) tablet 1 tablet  1 tablet Oral Hewitt Shorts, MD   1 tablet at 06/29/20 2148  . ondansetron (ZOFRAN) tablet 4 mg  4 mg Oral Q6H PRN [name], MD       Or  . ondansetron [facility]) injection 4 mg  4 mg Intravenous Q6H PRN [name], MD      . oxyCODONE-acetaminophen (PERCOCET/ROXICET) [phone]  MG per tablet 1 tablet  1 tablet Oral Q4H PRN [name], MD   1 tablet at 06/28/20 2232  . pantoprazole (PROTONIX) EC tablet 40 mg  40 mg Oral Daily [name], MD   40 mg at 06/30/20 1425  . polyvinyl alcohol (LIQUIFILM TEARS) 1.4 % ophthalmic solution 2 drop  2 drop Both Eyes TID PRN [name], MD   2 drop at 06/17/20 0953  . pregabalin (LYRICA) capsule 150 mg  150 mg Oral BID [name], MD   150 mg at 06/30/20 1425  . rosuvastatin (CRESTOR) tablet 10 mg  10 mg Oral Daily [name], MD   10 mg at 06/30/20 1425  . sodium chloride irrigation 0.9 % 3,000 mL  3,000 mL Irrigation Continuous [name], MD 200 mL/hr at 06/21/20 2233 3,000 mL at 06/22/20 0428  . sorbitol 70 % solution 30 mL  30 mL Oral PRN [name], MD      . tamsulosin [facility]) capsule 0.4 mg  0.4 mg Oral Daily [name], MD   0.4 mg at 06/30/20 1425     Objective: Vital: Vitals:   06/30/20 2024 06/30/20 2357 07/01/20 0340 07/01/20 0730  BP: (!) 127/53 (!) 134/55 (!) 151/68   Pulse: 82     Resp: 17 16 16    Temp: 98.1 F (36.7 C) 98.8 F (37.1 C) 98.1 F (36.7 C) 98.4 F (36.9 C)  TempSrc: Oral Oral Oral Oral  SpO2:   100%   Weight:   86.4 kg   Height:       I/Os: I/O last 3 completed shifts: In: 360 [P.O.:360] Out: 3950 [Urine:950; Other:3000]  Physical Exam:  General: Patient is in no apparent distress Lungs: Normal respiratory effort, chest expands symmetrically. GI: The abdomen is soft and nontender without mass. Ext: lower extremities symmetric  Urinal at bedside: thin, moderately pink urine without clot  Lab Results: Recent Labs    06/29/20 0706 06/30/20 0142 06/30/20 1458 07/01/20 0155  WBC 11.0* 8.1  --  7.0  HGB 8.5* 7.3* 7.5* 7.0*  HCT 26.7* 23.3* 24.0* 23.2*   Recent Labs    06/28/20 1516 06/29/20 0149 06/30/20 0142  NA 138 138 138  K 4.3 5.5* 5.2*  CL 100 101 101  CO2  --  26 27  GLUCOSE 139* 258* 189*  BUN 21 32* 50*  CREATININE 2.80* 4.01* 5.82*  CALCIUM  --  9.2 8.8*   No results for input(s): LABPT, INR in the last 72 hours. No results for input(s): LABURIN in the last 72 hours. Results for orders placed or performed during the hospital encounter of 06/16/20  SARS CORONAVIRUS 2 (TAT 6-24 HRS) Nasopharyngeal Nasopharyngeal Swab     Status: None   Collection Time: 06/16/20  7:12 PM   Specimen: Nasopharyngeal Swab  Result Value Ref Range Status   SARS Coronavirus 2 NEGATIVE NEGATIVE Final    Comment: (NOTE) SARS-CoV-2 target nucleic acids are NOT DETECTED.  The SARS-CoV-2 RNA is generally detectable in upper and lower respiratory specimens during the acute phase of infection. Negative results do not preclude SARS-CoV-2 infection, do not rule out co-infections with other pathogens, and should not be used as the sole basis for treatment or  other patient management decisions. Negative results must be combined with clinical observations, patient history, and epidemiological information. The expected result is Negative.  Fact Sheet for Patients: SugarRoll.be  Fact Sheet for Healthcare Providers: https://www.woods-mathews.com/  This test is not yet approved or cleared by the  [place] Architectural technologist and  has been authorized for detection and/or diagnosis of SARS-CoV-2 by FDA under an Print production planner (EUA). This EUA will remain  in effect (meaning this test can be used) for the duration of the COVID-19 declaration under Se ction 564(b)(1) of the Act, 21 U.S.C. section 360bbb-3(b)(1), unless the authorization is terminated or revoked sooner.  Performed at [name], [place] [address] [place], [place] [address]     Studies/Results: No results found.  Assessment/Plan:  Gross hematuria from radiation cystitis.  Hematuria appears to have resolved after most recent cystourethroscopy, clot evacuation, and fulguration on 07/14/20.   He has passed voiding trial  Will arrange outpatient urology follow up        Jacalyn Lefevre, MD Urology 07/01/2020, 9:53 AM

## 2020-07-01 NOTE — Progress Notes (Signed)
[name] Progress Note  Subjective: going home, but needs prbc x 1 first  Vitals:   07/01/20 0340 07/01/20 0730 07/01/20 1136 07/01/20 1153  BP: (!) 151/68 136/73 (!) 141/70 (!) 147/68  Pulse:  80 63 77  Resp: 16   16  Temp: 98.1 F (36.7 C) 98.4 F (36.9 C) (!) 97.4 F (36.3 C) 97.8 F (36.6 C)  TempSrc: Oral Oral Axillary Oral  SpO2: 100% 100%  100%  Weight: 86.4 kg     Height:        Exam:   alert, nad   no jvd  Chest cta bilat  Cor reg no RG  Abd soft ntnd no ascites   Ext no LE edema   Alert, NF, ox3   L AVF +bruit    OP HD:  HomeHD NxStage MTWF EDW 85kg No heparin  - last esa was mircera 150 ug in Nov 2021, none since  Assessment/Plan: 1. Radiation-induced hemorrhagic cystitis - hx prostate ca s/p radiation therapy. Urology following, s/pcystoscopy/clot removalin OR 3/12. Also has had cauterization, alum irrigation but still bleeding. Went back to OR 3/23 for clot evac + fulguration of bleeders and TURP. Had to go back again 3/24 early AM for pain > more clot was evacuated but no new bleeders noted. Looks better today. Per Urology. Going home today after prbc x 1.  2. ESRD - Home HD. Continue MWF 4h each while here. Will resume his HD at home prob on Monday.  3. Hypertension/volume - BPsl elevated. Continue home meds - Coreg, Losartan, hydralazine. Close to dry wt today. No vol issues.  4. Anemia - not on ESA as outpatient. Restarted darbepoetin 200 mcg q Wednesday here (rec'd 3/16, 3/23). Next dose 3/30. SP total 4u pRBCs this admit. Hb 7.0 today, to get prbc x 1.  5. Metabolic bone disease - OPCa/Phos at goal. Continue home calcitriol. 6. DM: per primary 7. H/o osteomyelitis   [name] 07/01/2020, 12:14 PM   Recent Labs  Lab 06/29/20 0149 06/29/20 0706 06/30/20 0142 06/30/20 1458 07/01/20 0155  K 5.5*  --  5.2*  --   --   BUN 32*  --  50*  --   --   CREATININE 4.01*  --  5.82*  --   --   CALCIUM 9.2  --  8.8*  --   --    HGB  --    < > 7.3* 7.5* 7.0*   < > = values in this interval not displayed.   Inpatient medications: . allopurinol  200 mg Oral Daily  . calcitRIOL  0.5 mcg Oral Daily  . carvedilol  12.5 mg Oral BID WC  . chlorhexidine  15 mL Mouth Rinse Daily  . Chlorhexidine Gluconate Cloth  6 each Topical Q0600  . darbepoetin (ARANESP) injection - DIALYSIS  200 mcg Intravenous Q Wed-HD  . docusate sodium  100 mg Oral BID  . insulin aspart  0-6 Units Subcutaneous TID WC  . levothyroxine  112 mcg Oral QAC breakfast  . losartan  100 mg Oral Daily  . multivitamin  1 tablet Oral QHS  . pantoprazole  40 mg Oral Daily  . pregabalin  150 mg Oral BID  . rosuvastatin  10 mg Oral Daily  . tamsulosin  0.4 mg Oral Daily   . sodium chloride irrigation 3,000 mL (06/21/20 2233)   acetaminophen **OR** acetaminophen, calcium carbonate (dosed in mg elemental calcium), camphor-menthol **AND** hydrOXYzine, docusate sodium, hydrALAZINE, HYDROmorphone (DILAUDID) injection, ondansetron **OR**  ondansetron (ZOFRAN) IV, oxyCODONE-acetaminophen, polyvinyl alcohol, sorbitol

## 2020-07-01 NOTE — Progress Notes (Signed)
PT Cancellation Note  Patient Details Name: [name] MRN: [id number] DOB: 30-Dec-1950   Cancelled Treatment:    Reason Eval/Treat Not Completed: Medical issues which prohibited therapy.  Hgb is 7 and will retry as time and pt allow.   Ramond Dial 07/01/2020, 9:52 AM   Mee Hives, PT MS Acute Rehab Dept. Number: [place] [facility] [place]

## 2020-07-01 NOTE — Discharge Summary (Signed)
Physician Discharge Summary  [name] VEH:[id number] DOB: 08-05-1950 DOA: 06/16/2020  PCP: Lorenda Hatchet, FNP  Admit date: 06/16/2020 Discharge date: 07/01/2020  Admitted From: home Disposition:  home  Recommendations for Outpatient Follow-up:  1. Follow up with PCP in 1-2 weeks 2. Please obtain BMP/CBC in one week 3. Please follow up on the following pending results:  Home Health:no  Equipment/Devices: none  Discharge Condition: Stable Code Status:   Code Status: Full Code Diet recommendation:  Diet Order            Diet - low sodium heart healthy           Diet regular Room service appropriate? Yes; Fluid consistency: Thin  Diet effective now                  Brief/Interim Summary: Brief Narrative: 70 year old male with history of prostate cancer hypothyroidism, hypertension, hyperlipidemia, diabetes mellitus ESRD on HD presented with hematuria for a few days.  He was admitted seen by urology recently as outpatient but hematuria had gotten worse so was brought to the hospital.  He underwent Foley catheter placement and three-way irrigation.  Patient continued to have large hematuria with clots underwent cystoscopic evaluation with fulguration on 06/17/2020, hematuria persisted needing blood transfusion and continued on CBI.  Urology following closely.  Patient again cystourethroscopy, clot evacuation and fulguration 3/24.Hematuria esolved and Foley catheter removed 2/25. Remains medically stable.  Hemoglobin is 7.0 g today -he wants to go home today after 1 unit PRBC transfusion.  Discharge Diagnoses:  Gross hematuria: Secondary to radiation cystitis. He was on CBI >underwent cystoscopy with fulguration 3/12 with ongoing hematuria-patient had repeat x 2 cystoscopy post-dialysis 3/23 and overnight 3/24 due to recurrent hematuria/difficult to pass Foley  Hematuria esolved and Foley catheter removed 2/25.  Follow-up with urology outpatient  Anemia of due to acute blood loss  from gross hematuria and chronic renal disease not on ESA as an outpatient placed on darbepoetin qwednesday.  s/p 4 units PRBC this admission.  Discussed with the patient and with nephrology and plan is to transfuse 1 more unit PRBC and will be discharged home. Recent Labs  Lab 06/28/20 1516 06/29/20 0706 06/30/20 0142 06/30/20 1458 07/01/20 0155  HGB 9.5* 8.5* 7.3* 7.5* 7.0*  HCT 28.0* 26.7* 23.3* 24.0* 23.2*   History of prostate cancer status post radiation in 2018. Follow-up with urology  ESRD on HD MWF, continue dialysis per nephrology.   Hyperkalemia improved to 5.2 and was dialyzed.  He will get dialysis when at home.Marland Kitchen  Recent Labs  Lab 06/27/20 0145 06/28/20 0131 06/28/20 1516 06/29/20 0149 06/30/20 0142  K 4.6 5.3* 4.3 5.5* 5.2*   Hypertension/volume blood blood pressure is controlled.  Continue his home Coreg losartan hydralazine.    Lethargy sleepiness: Status post procedure OVERNIGHT 3/23.  Resolved.  Metabolic bone disease continue PhosLo and calcitriol.  Calcium at goal.  Diabetes mellitus, controlled hemoglobin A1c 5.7, blood sugar is controlled On sliding scale insulin.  Recent Labs  Lab 06/29/20 2107 06/30/20 0611 06/30/20 1636 06/30/20 2122 07/01/20 0608  GLUCAP 180* 146* 297* 196* 174*   Hypercholesteremia: continue Crestor  Hypothyroidism TSH stable 4.8 continue home Synthroid  Overweight with BMI 27.8.  Deconditioning PT OT follow-up will be needed, discussed with nursing staff.  Consults:  Nephrology, urology  Subjective: aaox3, requesting for discharge this morning.  Discharge Exam: Vitals:   07/01/20 1153 07/01/20 1224  BP: (!) 147/68 137/79  Pulse: 77 77  Resp: 16 18  Temp: 97.8 F (36.6 C) 98.2 F (36.8 C)  SpO2: 100% 100%   General: Pt is alert, awake, not in acute distress Cardiovascular: RRR, S1/S2 +, no rubs, no gallops Respiratory: CTA bilaterally, no wheezing, no rhonchi Abdominal: Soft, NT, ND, bowel sounds  + Extremities: no edema, no cyanosis  Discharge Instructions  Discharge Instructions    Diet - low sodium heart healthy   Complete by: As directed    Discharge instructions   Complete by: As directed    Check CBC in next week  Continue dialysis at home  Please call call MD or return to ER for similar or worsening recurring problem that brought you to hospital or if any fever,nausea/vomiting,abdominal pain, uncontrolled pain, chest pain,  shortness of breath or any other alarming symptoms.  Please follow-up your doctor as instructed in a week time and call the office for appointment.  Please avoid alcohol, smoking, or any other illicit substance and maintain healthy habits including taking your regular medications as prescribed.  You were cared for by a hospitalist during your hospital stay. If you have any questions about your discharge medications or the care you received while you were in the hospital after you are discharged, you can call the unit and ask to speak with the hospitalist on call if the hospitalist that took care of you is not available.  Once you are discharged, your primary care physician will handle any further medical issues. Please note that NO REFILLS for any discharge medications will be authorized once you are discharged, as it is imperative that you return to your primary care physician (or establish a relationship with a primary care physician if you do not have one) for your aftercare needs so that they can reassess your need for medications and monitor your lab values.   Discharge wound care:   Complete by: As directed    Apply offloading dressing at coccyx   Increase activity slowly   Complete by: As directed      Allergies as of 07/01/2020      Reactions   Mushroom Extract Complex Nausea Only      Medication List    TAKE these medications   acetaminophen 500 MG tablet Commonly known as: TYLENOL Take 1,000-2,000 mg by mouth 2 (two) times daily as  needed (pain).   allopurinol 100 MG tablet Commonly known as: ZYLOPRIM Take 200 mg by mouth daily.   calcitRIOL 0.5 MCG capsule Commonly known as: ROCALTROL Take 0.5 mcg by mouth daily.   carvedilol 12.5 MG tablet Commonly known as: COREG Take 12.5 mg by mouth in the morning and at bedtime.   chlorhexidine 0.12 % solution Commonly known as: PERIDEX 15 mLs by Mouth Rinse route daily.   DIALYVITE 800 WITH ZINC 0.8 MG Tabs Take 1 tablet by mouth daily.   diclofenac Sodium 1 % Gel Commonly known as: VOLTAREN Apply 1 application topically 4 (four) times daily as needed (pain).   docusate sodium 100 MG capsule Commonly known as: COLACE Take 1 capsule (100 mg total) by mouth 2 (two) times daily.   levothyroxine 112 MCG tablet Commonly known as: SYNTHROID Take 112 mcg by mouth daily before breakfast. BRAND NAME SYNTHROID   losartan 100 MG tablet Commonly known as: COZAAR Take 100 mg by mouth daily.   Lubricant Eye Drops 0.4-0.3 % Soln Generic drug: Polyethyl Glycol-Propyl Glycol Place 1-2 drops into both eyes 3 (three) times daily as needed (dry/irritated eyes.).   MIRCERA IJ Inject into the skin.  mupirocin cream 2 % Commonly known as: BACTROBAN Apply 1 application topically daily as needed (wound care).   OneTouch Delica Plus [id number] Misc   oxyCODONE-acetaminophen 5-325 MG tablet Commonly known as: PERCOCET/ROXICET Take 1 tablet by mouth every 4 (four) hours as needed. What changed: reasons to take this   Ozempic (0.25 or 0.5 MG/DOSE) 2 MG/1.5ML Sopn Generic drug: Semaglutide(0.25 or 0.5MG /DOS) Inject 0.5 mg into the skin every Friday.   pantoprazole 40 MG tablet Commonly known as: PROTONIX Take 1 tablet (40 mg total) by mouth daily.   pregabalin 150 MG capsule Commonly known as: LYRICA Take 1 capsule (150 mg total) by mouth in the morning and at bedtime.   PRESCRIPTION MEDICATION Apply 1 application topically daily as needed (psoriasis). Cream for  psoriasis   prochlorperazine 5 MG tablet Commonly known as: COMPAZINE Take 1 tablet (5 mg total) by mouth every 6 (six) hours as needed for nausea or vomiting. What changed: when to take this   rosuvastatin 10 MG tablet Commonly known as: CRESTOR Take 10 mg by mouth daily.   tamsulosin 0.4 MG Caps capsule Commonly known as: FLOMAX Take 1 capsule (0.4 mg total) by mouth daily.            Discharge Care Instructions  (From admission, onward)         Start     Ordered   07/01/20 0000  Discharge wound care:       Comments: Apply offloading dressing at coccyx   07/01/20 1119          Follow-up Information    Schedule an appointment as soon as possible for a visit with [place].   Contact information: [place] [phone]             Allergies  Allergen Reactions  . Mushroom Extract Complex Nausea Only    The results of significant diagnostics from this hospitalization (including imaging, microbiology, ancillary and laboratory) are listed below for reference.    Microbiology: No results found for this or any previous visit (from the past 240 hour(s)).  Procedures/Studies: NM Gastric Emptying  Result Date: 06/08/2020 CLINICAL DATA:  Nausea. EXAM: NUCLEAR MEDICINE GASTRIC EMPTYING SCAN TECHNIQUE: After oral ingestion of radiolabeled meal, sequential abdominal images were obtained for 120 minutes. Residual percentage of activity remaining within the stomach was calculated at 60 and 120 minutes. RADIOPHARMACEUTICALS:  Two mCi Tc-44m sulfur colloid in standardized meal COMPARISON:  May 05, 2020 FINDINGS: Expected location of the stomach in the left upper quadrant. Ingested meal empties the stomach gradually over the course of the study with 45% retention at 60 min and 3% retention at 120 min (normal retention less than 30% at a 120 min). IMPRESSION: Normal gastric emptying study. Electronically Signed    By: Constance Holster M.D.   On: 06/08/2020 18:42   US PELVIS LIMITED (TRANSABDOMINAL ONLY)  Result Date: 06/29/2020 CLINICAL DATA:  70 year old male with clot within the urinary bladder. EXAM: LIMITED ULTRASOUND OF PELVIS TECHNIQUE: Limited transabdominal ultrasound examination of the pelvis was performed. COMPARISON:  Ultrasound dated 06/25/2020. FINDINGS: There is a 7.1 x 7.5 x 8.8 cm rounded heterogeneous echogenic mass with no internal vascularity is noted within the urinary bladder. This is most consistent with a blood clot. Clinical correlation and follow-up recommended. IMPRESSION: Large blood clot within the urinary bladder. Electronically Signed   By: Anner Crete M.D.   On: 06/29/2020 01:50   US PELVIS LIMITED (TRANSABDOMINAL  ONLY)  Result Date: 06/25/2020 CLINICAL DATA:  Evaluate for retained clot in urinary bladder. Gross hematuria. EXAM: URINARY TRACT ULTRASOUND COMPLETE COMPARISON:  Ultrasound pelvis 06/16/2020 FINDINGS: Urinary bladder: There is a 7.1 x 4.1 x 3 cm (18mL) heterogeneous avascular hyperechoic foci within the urinary bladder lumen that is consistent with a known clot. IMPRESSION: 1. Several foci of gas associated with the urinary bladder. Unclear if finding is within the urinary bladder lumen versus within the urinary bladder wall. If within the wall finding is concerning for emphysematous cystitis. If within the lumen, finding could be related recent instrumentation versus infection. Correlate clinically. 2. Persistent 7.1 cm avascular heterogeneous hyperechoic foci within urinary bladder lumen consistent with known clot. These results will be called to the ordering clinician or representative by the Radiologist Assistant, and communication documented in the PACS or [employer]. Electronically Signed   By: Iven Finn M.D.   On: 06/25/2020 20:10   US PELVIS LIMITED (TRANSABDOMINAL ONLY)  Result Date: 06/16/2020 CLINICAL DATA:  Blood clot can bladder, continuous  bladder irrigation EXAM: LIMITED ULTRASOUND OF PELVIS TECHNIQUE: Limited transabdominal ultrasound examination of the pelvis was performed. COMPARISON:  None. FINDINGS: Sonographic evaluation of the bladder was performed. Foley catheter is seen within the urinary bladder. There is a 6.8 x 7.3 x 6.1 cm blood clot surrounding the Foley catheter balloon. No other filling defects within the bladder. IMPRESSION: 1. Large clot surrounding the Foley catheter as above. Electronically Signed   By: Randa Ngo M.D.   On: 06/16/2020 23:29   DG Cystogram  Result Date: 06/18/2020 CLINICAL DATA:  Cystoscopy. EXAM: CYSTOGRAM TECHNIQUE: Retrograde pyelogram. FLUOROSCOPY TIME:  Fluoroscopy Time:  28 seconds Radiation Exposure Index (if provided by the fluoroscopic device): 9.02 mGy Number of Acquired Spot Images: 2 COMPARISON:  None. FINDINGS: Retrograde pyelogram images were obtained bilaterally. The images were not marked laterality. One of the pyelograms demonstrates no filling defects in the visualized collecting system. The other pyelogram demonstrates a filling defect in the proximal ureter. The op report states no filling defects. As a result, the filling defect on this study may represent an air bubble. IMPRESSION: Bilateral pyelograms as above. Electronically Signed   By: Dorise Bullion III M.D   On: 06/18/2020 07:08   DG C-Arm 1-60 Min  Result Date: 06/17/2020 CLINICAL DATA:  Cystogram. EXAM: DG C-ARM 1-60 MIN FLUOROSCOPY TIME:  Fluoroscopy Time:  28 seconds Number of Acquired Spot Images: 2 COMPARISON:  None. FINDINGS: Retrograde pyelogram performed bilaterally. There is an apparent defect in the proximal ureter on 1 side. The side is not marked. However, correlation with the operative note from today indicated no filling defects in this may simply represent an air bubble that was transient. No other defects identified. IMPRESSION: Retrograde pyelogram as above. Electronically Signed   By: Dorise Bullion III  M.D   On: 06/17/2020 15:33    Labs: BNP (last 3 results) No results for input(s): BNP in the last 8760 hours. Basic Metabolic Panel: Recent Labs  Lab 06/26/20 0147 06/27/20 0145 06/28/20 0131 06/28/20 1516 06/29/20 0149 06/30/20 0142  NA 138 141 139 138 138 138  K 5.0 4.6 5.3* 4.3 5.5* 5.2*  CL 104 104 104 100 101 101  CO2 24 29 26   --  26 27  GLUCOSE 199* 170* 174* 139* 258* 189*  BUN 53* 35* 51* 21 32* 50*  CREATININE 5.39* 3.94* 4.83* 2.80* 4.01* 5.82*  CALCIUM 8.8* 8.8* 9.1  --  9.2 8.8*  MG  --  1.8 2.0  --   --   --    Liver Function Tests: No results for input(s): AST, ALT, ALKPHOS, BILITOT, PROT, ALBUMIN in the last 168 hours. No results for input(s): LIPASE, AMYLASE in the last 168 hours. No results for input(s): AMMONIA in the last 168 hours. CBC: Recent Labs  Lab 06/25/20 0130 06/25/20 0401 06/26/20 0147 06/27/20 0145 06/28/20 0131 06/28/20 1516 06/29/20 0706 06/30/20 0142 06/30/20 1458 07/01/20 0155  WBC 5.6  --  6.8 7.0 7.6  --  11.0* 8.1  --  7.0  NEUTROABS 4.1  --  5.1  --   --   --   --   --   --   --   HGB 6.3*   < > 7.8* 7.9* 8.3* 9.5* 8.5* 7.3* 7.5* 7.0*  HCT 19.9*   < > 25.2* 24.4* 26.9* 28.0* 26.7* 23.3* 24.0* 23.2*  MCV 88.8  --  88.7 86.8 88.2  --  87.0 87.9  --  89.2  PLT 145*  --  138* 154 156  --  180 153  --  142*   < > = values in this interval not displayed.   Cardiac Enzymes: No results for input(s): CKTOTAL, CKMB, CKMBINDEX, TROPONINI in the last 168 hours. BNP: Invalid input(s): POCBNP CBG: Recent Labs  Lab 06/30/20 0611 06/30/20 1636 06/30/20 2122 07/01/20 0608 07/01/20 1133  GLUCAP 146* 297* 196* 174* 161*   D-Dimer No results for input(s): DDIMER in the last 72 hours. Hgb A1c No results for input(s): HGBA1C in the last 72 hours. Lipid Profile No results for input(s): CHOL, HDL, LDLCALC, TRIG, CHOLHDL, LDLDIRECT in the last 72 hours. Thyroid function studies No results for input(s): TSH, T4TOTAL, T3FREE,  THYROIDAB in the last 72 hours.  Invalid input(s): FREET3 Anemia work up No results for input(s): VITAMINB12, FOLATE, FERRITIN, TIBC, IRON, RETICCTPCT in the last 72 hours. Urinalysis    Component Value Date/Time   COLORURINE RED (A) 06/15/2020 0711   APPEARANCEUR CLOUDY (A) 06/15/2020 0711   LABSPEC  06/15/2020 0711    TEST NOT REPORTED DUE TO COLOR INTERFERENCE OF URINE PIGMENT   PHURINE  06/15/2020 0711    TEST NOT REPORTED DUE TO COLOR INTERFERENCE OF URINE PIGMENT   GLUCOSEU (A) 06/15/2020 0711    TEST NOT REPORTED DUE TO COLOR INTERFERENCE OF URINE PIGMENT   HGBUR (A) 06/15/2020 0711    TEST NOT REPORTED DUE TO COLOR INTERFERENCE OF URINE PIGMENT   BILIRUBINUR (A) 06/15/2020 0711    TEST NOT REPORTED DUE TO COLOR INTERFERENCE OF URINE PIGMENT   KETONESUR (A) 06/15/2020 0711    TEST NOT REPORTED DUE TO COLOR INTERFERENCE OF URINE PIGMENT   PROTEINUR (A) 06/15/2020 0711    TEST NOT REPORTED DUE TO COLOR INTERFERENCE OF URINE PIGMENT   UROBILINOGEN 0.2 12/28/2014 1031   NITRITE (A) 06/15/2020 0711    TEST NOT REPORTED DUE TO COLOR INTERFERENCE OF URINE PIGMENT   LEUKOCYTESUR (A) 06/15/2020 0711    TEST NOT REPORTED DUE TO COLOR INTERFERENCE OF URINE PIGMENT   Sepsis Labs Invalid input(s): PROCALCITONIN,  WBC,  LACTICIDVEN Microbiology No results found for this or any previous visit (from the past 240 hour(s)).   Time coordinating discharge: 35 minutes  SIGNED: Antonieta Pert, MD  [facility] 07/01/2020, 1:09 PM  If 7PM-7AM, please contact night-coverage [email or web address]

## 2020-07-02 LAB — TYPE AND SCREEN
ABO/RH(D): O POS
Antibody Screen: NEGATIVE
Unit division: 0

## 2020-07-02 LAB — BPAM RBC
Blood Product Expiration Date: 202204252359
ISSUE DATE / TIME: 202203261159
Unit Type and Rh: 5100

## 2020-07-06 ENCOUNTER — Ambulatory Visit: Payer: Medicare Other | Admitting: Nurse Practitioner

## 2020-07-22 ENCOUNTER — Encounter (HOSPITAL_COMMUNITY): Payer: Self-pay | Admitting: Emergency Medicine

## 2020-07-22 ENCOUNTER — Other Ambulatory Visit: Payer: Self-pay

## 2020-07-22 ENCOUNTER — Emergency Department (HOSPITAL_COMMUNITY)
Admission: EM | Admit: 2020-07-22 | Discharge: 2020-07-22 | Disposition: A | Discharge status: Home / Self Care | Payer: Medicare Other | Attending: Emergency Medicine | Admitting: Emergency Medicine

## 2020-07-22 DIAGNOSIS — Z79899 Other long term (current) drug therapy: Secondary | ICD-10-CM | POA: Diagnosis not present

## 2020-07-22 DIAGNOSIS — E1140 Type 2 diabetes mellitus with diabetic neuropathy, unspecified: Secondary | ICD-10-CM | POA: Diagnosis not present

## 2020-07-22 DIAGNOSIS — E039 Hypothyroidism, unspecified: Secondary | ICD-10-CM | POA: Diagnosis not present

## 2020-07-22 DIAGNOSIS — Z8546 Personal history of malignant neoplasm of prostate: Secondary | ICD-10-CM | POA: Insufficient documentation

## 2020-07-22 DIAGNOSIS — R319 Hematuria, unspecified: Secondary | ICD-10-CM

## 2020-07-22 DIAGNOSIS — I2510 Atherosclerotic heart disease of native coronary artery without angina pectoris: Secondary | ICD-10-CM | POA: Diagnosis not present

## 2020-07-22 DIAGNOSIS — N186 End stage renal disease: Secondary | ICD-10-CM | POA: Insufficient documentation

## 2020-07-22 DIAGNOSIS — Z951 Presence of aortocoronary bypass graft: Secondary | ICD-10-CM | POA: Insufficient documentation

## 2020-07-22 DIAGNOSIS — N3040 Irradiation cystitis without hematuria: Secondary | ICD-10-CM

## 2020-07-22 DIAGNOSIS — I120 Hypertensive chronic kidney disease with stage 5 chronic kidney disease or end stage renal disease: Secondary | ICD-10-CM | POA: Diagnosis not present

## 2020-07-22 DIAGNOSIS — Z794 Long term (current) use of insulin: Secondary | ICD-10-CM | POA: Insufficient documentation

## 2020-07-22 DIAGNOSIS — E1122 Type 2 diabetes mellitus with diabetic chronic kidney disease: Secondary | ICD-10-CM | POA: Diagnosis not present

## 2020-07-22 DIAGNOSIS — Z87891 Personal history of nicotine dependence: Secondary | ICD-10-CM | POA: Insufficient documentation

## 2020-07-22 DIAGNOSIS — N3091 Cystitis, unspecified with hematuria: Secondary | ICD-10-CM | POA: Insufficient documentation

## 2020-07-22 DIAGNOSIS — R339 Retention of urine, unspecified: Secondary | ICD-10-CM | POA: Diagnosis present

## 2020-07-22 LAB — CBC WITH DIFFERENTIAL/PLATELET
Abs Immature Granulocytes: 0.03 10*3/uL (ref 0.00–0.07)
Basophils Absolute: 0.1 10*3/uL (ref 0.0–0.1)
Basophils Relative: 1 %
Eosinophils Absolute: 0.1 10*3/uL (ref 0.0–0.5)
Eosinophils Relative: 2 %
HCT: 30.8 % — ABNORMAL LOW (ref 39.0–52.0)
Hemoglobin: 9.5 g/dL — ABNORMAL LOW (ref 13.0–17.0)
Immature Granulocytes: 1 %
Lymphocytes Relative: 6 %
Lymphs Abs: 0.3 10*3/uL — ABNORMAL LOW (ref 0.7–4.0)
MCH: 27.1 pg (ref 26.0–34.0)
MCHC: 30.8 g/dL (ref 30.0–36.0)
MCV: 88 fL (ref 80.0–100.0)
Monocytes Absolute: 0.4 10*3/uL (ref 0.1–1.0)
Monocytes Relative: 8 %
Neutro Abs: 4.2 10*3/uL (ref 1.7–7.7)
Neutrophils Relative %: 82 %
Platelets: 99 10*3/uL — ABNORMAL LOW (ref 150–400)
RBC: 3.5 MIL/uL — ABNORMAL LOW (ref 4.22–5.81)
RDW: 16.9 % — ABNORMAL HIGH (ref 11.5–15.5)
WBC: 5.1 10*3/uL (ref 4.0–10.5)
nRBC: 0 % (ref 0.0–0.2)

## 2020-07-22 LAB — BASIC METABOLIC PANEL
Anion gap: 12 (ref 5–15)
BUN: 37 mg/dL — ABNORMAL HIGH (ref 8–23)
CO2: 26 mmol/L (ref 22–32)
Calcium: 8.9 mg/dL (ref 8.9–10.3)
Chloride: 101 mmol/L (ref 98–111)
Creatinine, Ser: 4.1 mg/dL — ABNORMAL HIGH (ref 0.61–1.24)
GFR, Estimated: 15 mL/min — ABNORMAL LOW (ref >60)
Glucose, Bld: 156 mg/dL — ABNORMAL HIGH (ref 70–99)
Potassium: 3.8 mmol/L (ref 3.5–5.1)
Sodium: 139 mmol/L (ref 135–145)

## 2020-07-22 MED ORDER — SODIUM CHLORIDE 0.9 % IR SOLN
3000.0000 mL | Status: DC
  Administered 2020-07-22: 3000 mL

## 2020-07-22 MED ORDER — ONDANSETRON HCL 4 MG/2ML IJ SOLN
4.0000 mg | Freq: Once | INTRAMUSCULAR | Status: AC
Start: 2020-07-22 — End: 2020-07-22
  Administered 2020-07-22: 4 mg via INTRAVENOUS
  Filled 2020-07-22: qty 2

## 2020-07-22 MED ORDER — FENTANYL CITRATE (PF) 100 MCG/2ML IJ SOLN
50.0000 ug | Freq: Once | INTRAMUSCULAR | Status: AC
Start: 2020-07-22 — End: 2020-07-22
  Administered 2020-07-22: 50 ug via INTRAVENOUS
  Filled 2020-07-22: qty 2

## 2020-07-22 NOTE — ED Provider Notes (Signed)
[facility] DEPT Provider Note   CSN: [id number] Arrival date & time: 07/22/20  3500     History Chief Complaint  Patient presents with  . Urinary Retention    [name] is a 70 y.o. male.  [name] complains of urinary retention.  It is likely secondary to hematuria from radiation cystitis.  He was hospitalized at the end of March, and he had a similar but more severe issue.  He is followed by urology, they have discussed hyperbaric oxygen treatments.  He is having some abdominal pain secondary to the urinary retention.  The history is provided by the patient.  Hematuria This is a new problem. The current episode started 3 to 5 hours ago. The problem occurs constantly. The problem has not changed since onset.Associated symptoms include abdominal pain. Pertinent negatives include no chest pain, no headaches and no shortness of breath. Nothing aggravates the symptoms. Nothing relieves the symptoms. He has tried nothing for the symptoms. The treatment provided no relief.       Past Medical History:  Diagnosis Date  . Ambulates with cane    straight cane  . Cervical myelopathy ([place]) 02/06/2018  . Chronic kidney disease    dailysis M W F- home  . Complication of anesthesia   . Coronary artery disease   . Diabetes mellitus without complication ([place])    type2  . Diabetic foot ulcer ([place])   . Diabetic neuropathy ([place]) 02/06/2018  . Gait abnormality 08/24/2018  . GERD (gastroesophageal reflux disease)     01/06/20- not current  . Gout   . History of kidney stones    passed stones  . Hypercholesteremia   . Hypertension   . Hypothyroidism   . Neuromuscular disorder (HCC)    neuropathy left leg and bilateral feet  . Neuropathy   . PONV (postoperative nausea and vomiting)   . Prostate cancer ([place])   . PVD (peripheral vascular disease) ([place])    with amputations    Patient Active Problem List   Diagnosis Date Noted  . Pressure injury of skin  06/29/2020  . Gross hematuria 06/16/2020  . Typical atrial flutter ([place]) 03/24/2020  . Bilateral pleural effusion 02/24/2020  . Healthcare maintenance 01/28/2020  . Shortness of breath 01/28/2020  . History of partial ray amputation of fourth toe of right foot ([place]) 09/08/2019  . Ulcerated, foot, right, with necrosis of bone ([place])   . Chronic cough 06/25/2019  . Cutaneous abscess of right foot   . Subacute osteomyelitis of right foot ([place]) 12/14/2018  . AKI (acute kidney injury) ([place]) 12/14/2018  . ESRD (end stage renal disease) ([place]) 12/14/2018  . Gait abnormality 08/24/2018  . Diabetic neuropathy ([place]) 02/06/2018  . Cervical myelopathy ([place]) 02/06/2018  . Onychomycosis 10/30/2017  . Other spondylosis with radiculopathy, cervical region 01/27/2017  . Midfoot ulcer, right, limited to breakdown of skin ([place]) 11/15/2016  . Lateral epicondylitis, left elbow 08/12/2016  . Cellulitis of fifth toe of right foot 07/29/2016  . Prostate cancer ([place]) 06/19/2016  . Non-pressure chronic ulcer of other part of right foot limited to breakdown of skin ([place]) 04/04/2016  . Cellulitis of leg, right 12/24/2015  . Cellulitis of right lower extremity 12/24/2015  . CKD (chronic kidney disease), stage III ([place]) 12/25/2013  . Foot infection 12/24/2013  . Foot ulcer, left ([place]) 12/24/2013  . Diabetic foot ulcer ([place]) 09/14/2012  . Gout 09/14/2012  . HTN (hypertension) 09/14/2012  . Hypothyroidism 09/14/2012  . CAD (coronary artery disease)  09/14/2012  . Diabetes mellitus ([place]) 09/14/2012  . Hypercholesteremia   . Neuropathy [facility])     Past Surgical History:  Procedure Laterality Date  . A-FLUTTER ABLATION N/A 04/06/2020   Procedure: A-FLUTTER ABLATION;  Surgeon: [name], MD;  Location: [facility];  Service: Cardiovascular;  Laterality: N/A;  . AMPUTATION Left 12/25/2013   Procedure: AMPUTATION RAY LEFT 5TH RAY;  Surgeon: [name], MD;  Location: [facility];  Service: Orthopedics;   Laterality: Left;  . AMPUTATION Right 12/15/2018   Procedure: AMPUTATION OF 4TH AND 5TH TOES RIGHT FOOT;  Surgeon: [name], MD;  Location: [facility];  Service: Orthopedics;  Laterality: Right;  . APPLICATION OF WOUND VAC Right 12/15/2018   Procedure: APPLICATION OF WOUND VAC;  Surgeon: [name], MD;  Location: [facility];  Service: Orthopedics;  Laterality: Right;  . AV FISTULA PLACEMENT Left 03/25/2019   Procedure: LEFT ARM ARTERIOVENOUS (AV) FISTULA CREATION;  Surgeon: [name], MD;  Location: [facility];  Service: Vascular;  Laterality: Left;  . BACK SURGERY    . BASCILIC VEIN TRANSPOSITION Left 05/20/2019   Procedure: SECOND STAGE LEFT BASCILIC VEIN TRANSPOSITION;  Surgeon: [name], MD;  Location: [facility];  Service: Vascular;  Laterality: Left;  . CARDIAC CATHETERIZATION  02/17/2014  . CHOLECYSTECTOMY    . COLONOSCOPY  2011   in [place], [place] GRAFT  2008  . CYSTOSCOPY N/A 06/29/2020   Procedure: CYSTOSCOPY WITH CLOT EVACUATION AND  FULGERATION;  Surgeon: [name], MD;  Location: [facility];  Service: Urology;  Laterality: N/A;  . CYSTOSCOPY WITH FULGERATION Bilateral 06/17/2020   Procedure: CYSTOSCOPY,BILATERAL RETROGRADE, CLOT EVACUATION WITH FULGERATION OF THE BLADDER;  Surgeon: Janith Lima, MD;  Location: [facility];  Service: Urology;  Laterality: Bilateral;  . CYSTOSCOPY WITH FULGERATION N/A 06/28/2020   Procedure: CYSTOSCOPY WITH CLOT EVACUATION AND FULGERATION OF BLEEDERS;  Surgeon: [name], MD;  Location: [facility];  Service: Urology;  Laterality: N/A;  1 HR  . I & D EXTREMITY Right 12/15/2018   Procedure: DEBRIDEMENT RIGHT FOOT;  Surgeon: [name], MD;  Location: [facility];  Service: Orthopedics;  Laterality: Right;  . I & D EXTREMITY Right 08/27/2019   Procedure: PARTIAL CUBOID EXCISION RIGHT FOOT;  Surgeon: [name], MD;  Location: [facility];  Service: Orthopedics;  Laterality: Right;  . I & D EXTREMITY Right 01/07/2020    Procedure: RIGHT FOOT EXCISION INFECTED BONE;  Surgeon: [name], MD;  Location: [facility];  Service: Orthopedics;  Laterality: Right;  . NECK SURGERY     novemver 2019  . TRANSURETHRAL RESECTION OF PROSTATE N/A 06/28/2020   Procedure: TRANSURETHRAL RESECTION OF THE PROSTATE (TURP);  Surgeon: [name], MD;  Location: [facility];  Service: Urology;  Laterality: N/A;  . WISDOM TOOTH EXTRACTION         Family History  Problem Relation Age of Onset  . Diabetes Mellitus II Mother   . Kidney disease Mother   . Diabetes Mellitus II Father   . CAD Father   . Cancer Father        prostate  . Kidney disease Father   . Diabetes Mellitus II Brother   . Kidney disease Brother   . Diabetes Mellitus II Brother   . Stomach cancer Brother 100  . Kidney disease Brother   . Colon cancer Neg Hx   . Colon polyps Neg Hx   . Esophageal cancer  Neg Hx   . Rectal cancer Neg Hx   . Pancreatic cancer Neg Hx     Social History   Tobacco Use  . Smoking status: Former Smoker    Years: 25.00    Types: Cigars    Start date: 04/08/1973    Quit date: 12/24/1988    Years since quitting: 31.5  . Smokeless tobacco: Never Used  . Tobacco comment: Cigars and Pipe   Vaping Use  . Vaping Use: Never used  Substance Use Topics  . Alcohol use: No  . Drug use: No    Home Medications Prior to Admission medications   Medication Sig Start Date End Date Taking? Authorizing Provider  acetaminophen (TYLENOL) 500 MG tablet Take 1,000-2,000 mg by mouth 2 (two) times daily as needed (pain).   Yes [provider]  allopurinol (ZYLOPRIM) 100 MG tablet Take 200 mg by mouth daily.  05/04/18  Yes [provider]  B Complex-C-Zn-Folic Acid (DIALYVITE 324 WITH ZINC) 0.8 MG TABS Take 1 tablet by mouth daily. 12/23/19  Yes [provider]  calcitRIOL (ROCALTROL) 0.5 MCG capsule Take 0.5 mcg by mouth daily.  08/11/19  Yes [provider]  carvedilol (COREG) 12.5 MG tablet Take 12.5 mg by  mouth in the morning and at bedtime.   Yes [provider]  chlorhexidine (PERIDEX) 0.12 % solution 15 mLs by Mouth Rinse route daily. 12/07/19  Yes [provider]  diclofenac Sodium (VOLTAREN) 1 % GEL Apply 1 application topically 4 (four) times daily as needed (pain). 05/04/19  Yes [provider]  levothyroxine (SYNTHROID, LEVOTHROID) 112 MCG tablet Take 112 mcg by mouth daily before breakfast. BRAND NAME SYNTHROID   Yes [provider]  losartan (COZAAR) 100 MG tablet Take 100 mg by mouth daily.   Yes [provider]  Methoxy PEG-Epoetin Beta (MIRCERA IJ) Inject into the skin. 12/23/19  Yes [provider]  mupirocin cream (BACTROBAN) 2 % Apply 1 application topically daily as needed (wound care). 05/23/20  Yes [provider]  pantoprazole (PROTONIX) 40 MG tablet Take 1 tablet (40 mg total) by mouth daily. 05/11/20  Yes Irene Shipper, MD  Polyethyl Glycol-Propyl Glycol (LUBRICANT EYE DROPS) 0.4-0.3 % SOLN Place 1-2 drops into both eyes 3 (three) times daily as needed (dry/irritated eyes.).   Yes [provider]  pregabalin (LYRICA) 150 MG capsule Take 1 capsule (150 mg total) by mouth in the morning and at bedtime. 05/09/20  Yes Persons, Bevely Palmer, [place]  PRESCRIPTION MEDICATION Apply 1 application topically daily as needed (psoriasis). Cream for psoriasis   Yes [provider]  prochlorperazine (COMPAZINE) 5 MG tablet Take 1 tablet (5 mg total) by mouth every 6 (six) hours as needed for nausea or vomiting. Patient taking differently: Take 5 mg by mouth 2 (two) times daily. 05/11/20  Yes Irene Shipper, MD  rosuvastatin (CRESTOR) 10 MG tablet Take 10 mg by mouth daily.    Yes [provider]  Semaglutide,0.25 or 0.5MG /DOS, (OZEMPIC, 0.25 OR 0.5 MG/DOSE,) 2 MG/1.5ML SOPN Inject 0.5 mg into the skin every Friday.   Yes [provider]  sevelamer carbonate (RENVELA) 800 MG tablet Take 800 mg by mouth 3 (three)  times daily. 07/20/20  Yes [provider]  tamsulosin (FLOMAX) 0.4 MG CAPS capsule Take 1 capsule (0.4 mg total) by mouth daily. 07/01/20 07/31/20 Yes Antonieta Pert, MD  docusate sodium (COLACE) 100 MG capsule Take 1 capsule (100 mg total) by mouth 2 (two) times daily.  Patient not taking: No sig reported 07/01/20   Antonieta Pert, MD  Lancets (ONETOUCH DELICA PLUS [id number]) [place]  01/17/20   [provider]  oxyCODONE-acetaminophen (PERCOCET/ROXICET) 5-325 MG tablet Take 1 tablet by mouth every 4 (four) hours as needed. Patient not taking: Reported on 07/22/2020 01/07/20   [name], MD    Allergies    Mushroom extract complex  Review of Systems   Review of Systems  Constitutional: Negative for chills and fever.  HENT: Negative for ear pain and sore throat.   Eyes: Negative for pain and visual disturbance.  Respiratory: Negative for cough and shortness of breath.   Cardiovascular: Negative for chest pain and palpitations.  Gastrointestinal: Positive for abdominal pain. Negative for vomiting.  Genitourinary: Positive for hematuria. Negative for dysuria.  Musculoskeletal: Negative for arthralgias and back pain.  Skin: Negative for color change and rash.  Neurological: Negative for seizures, syncope and headaches.  All other systems reviewed and are negative.   Physical Exam Updated Vital Signs BP (!) 139/93 (BP Location: Right Arm)   Pulse (!) 56   Temp 98.5 F (36.9 C) (Oral)   Resp 16   Ht 5\' 10"  (1.778 m)   Wt 86.2 kg   SpO2 100%   BMI 27.26 kg/m   Physical Exam Vitals and nursing note reviewed.  Constitutional:      Appearance: Normal appearance.  HENT:     Head: Normocephalic and atraumatic.  Eyes:     Conjunctiva/sclera: Conjunctivae normal.  Pulmonary:     Effort: Pulmonary effort is normal. No respiratory distress.  Abdominal:     General: There is no distension.     Palpations: Abdomen is soft.     Tenderness: There is no abdominal tenderness.   Musculoskeletal:        General: No deformity. Normal range of motion.     Cervical back: Normal range of motion.  Skin:    General: Skin is warm and dry.  Neurological:     General: No focal deficit present.     Mental Status: He is alert and oriented to person, place, and time. Mental status is at baseline.  Psychiatric:        Mood and Affect: Mood normal.     ED Results / Procedures / Treatments   Labs (all labs ordered are listed, but only abnormal results are displayed) Labs Reviewed  BASIC METABOLIC PANEL - Abnormal; Notable for the following components:      Result Value   Glucose, Bld 156 (*)    BUN 37 (*)    Creatinine, Ser 4.10 (*)    GFR, Estimated 15 (*)    All other components within normal limits  CBC WITH DIFFERENTIAL/PLATELET - Abnormal; Notable for the following components:   RBC 3.50 (*)    Hemoglobin 9.5 (*)    HCT 30.8 (*)    RDW 16.9 (*)    Platelets 99 (*)    Lymphs Abs 0.3 (*)    All other components within normal limits    EKG None  Radiology No results found.  Procedures Procedures   Medications Ordered in ED Medications  sodium chloride irrigation 0.9 % 3,000 mL (3,000 mLs Irrigation New Bag/Given 07/22/20 0933)  fentaNYL (SUBLIMAZE) injection 50 mcg (50 mcg Intravenous Given 07/22/20 0933)  ondansetron (ZOFRAN) injection 4 mg (4 mg Intravenous Given 07/22/20 0934)    ED Course  I have reviewed the triage vital signs and the nursing notes.  Pertinent labs & imaging  results that were available during my care of the patient were reviewed by me and considered in my medical decision making (see chart for details).    MDM Rules/Calculators/A&P                          Jamille Yoshino was evaluated here in the ED.  Foley catheter was placed, and irrigation was performed until the urine was light pink.  He will be discharged with a Foley catheter, and I did recommend he follow-up immediately with urology.  He was given return precautions.   Hemoglobin improved since last check.  Renal function consistent with known history of end-stage renal disease. Final Clinical Impression(s) / ED Diagnoses Final diagnoses:  Hematuria, unspecified type  Radiation cystitis    Rx / DC Orders ED Discharge Orders    None       Arnaldo Natal, MD 07/22/20 1113

## 2020-07-22 NOTE — ED Triage Notes (Signed)
Emergency Medicine Provider Triage Evaluation Note  [name] , a 70 y.o. male  was evaluated in triage.  Pt complains of acute urinary retention.  Review of Systems  Positive: abd pain, urinary retention Negative: fever  Physical Exam  BP (!) 182/103 (BP Location: Right Arm)   Pulse 100   Temp 98.5 F (36.9 C) (Oral)   Resp 18   Ht 5\' 10"  (1.778 m)   Wt 86.2 kg   SpO2 93%   BMI 27.26 kg/m  Gen:   Awake, appears uncomfortable HEENT:  Atraumatic  Resp:  tachypneic Cardiac:  tachycardic Abd:   Suprapubic fullness and tenderness MSK:   Moves extremities without difficulty  Neuro:  Speech clear   Medical Decision Making  Medically screening exam initiated at 7:23 AM.  Appropriate orders placed.  Draken Farrior was informed that the remainder of the evaluation will be completed by another provider, this initial triage assessment does not replace that evaluation, and the importance of remaining in the ED until their evaluation is complete.  Clinical Impression  Acute urinary retention.  I was able to advance a coude catheter.  Will need bladder irrigation.   BLADDER CATHETERIZATION  Date/Time: 07/22/2020 7:26 AM Performed by: Domenic Moras, PA-C Authorized by: Domenic Moras, PA-C   Consent:    Consent obtained:  Verbal   Consent given by:  Patient   Risks, benefits, and alternatives were discussed: yes     Risks discussed:  False passage, pain and urethral injury   Alternatives discussed:  Alternative treatment Universal protocol:    Procedure explained and questions answered to patient or proxy's satisfaction: yes     Relevant documents present and verified: yes     Patient identity confirmed:  Verbally with patient and arm band Pre-procedure details:    Procedure purpose:  Therapeutic   Preparation: Patient was prepped and draped in usual sterile fashion   Anesthesia:    Anesthesia method:  None Procedure details:    Provider performed due to:  Nurse unable to complete    Catheter insertion:  Temporary indwelling   Catheter type:  Coude   Catheter size:  16 Fr   Bladder irrigation: yes     Number of attempts:  1   Urine characteristics:  Bloody Post-procedure details:    Procedure completion:  Tolerated with difficulty      Domenic Moras, PA-C 07/22/20 [id number]

## 2020-07-22 NOTE — ED Triage Notes (Signed)
Called materials for 3-day catheter

## 2020-07-22 NOTE — ED Triage Notes (Signed)
Patient presents with urinary retention after an admission at [facility]. Patient states it has been "hours" since he last urinated. States he is leaking some blood from his meatus.

## 2020-07-28 ENCOUNTER — Other Ambulatory Visit: Payer: Medicare Other

## 2020-08-04 ENCOUNTER — Ambulatory Visit: Payer: Medicare Other | Admitting: Cardiology

## 2020-08-04 ENCOUNTER — Encounter (HOSPITAL_BASED_OUTPATIENT_CLINIC_OR_DEPARTMENT_OTHER): Payer: Medicare Other | Admitting: Internal Medicine

## 2020-08-07 ENCOUNTER — Other Ambulatory Visit: Payer: Self-pay

## 2020-08-07 ENCOUNTER — Encounter (HOSPITAL_BASED_OUTPATIENT_CLINIC_OR_DEPARTMENT_OTHER): Payer: Medicare Other | Attending: Internal Medicine | Admitting: Internal Medicine

## 2020-08-07 DIAGNOSIS — Z923 Personal history of irradiation: Secondary | ICD-10-CM | POA: Insufficient documentation

## 2020-08-07 DIAGNOSIS — N3041 Irradiation cystitis with hematuria: Secondary | ICD-10-CM | POA: Insufficient documentation

## 2020-08-07 DIAGNOSIS — E119 Type 2 diabetes mellitus without complications: Secondary | ICD-10-CM | POA: Diagnosis not present

## 2020-08-07 DIAGNOSIS — Z8546 Personal history of malignant neoplasm of prostate: Secondary | ICD-10-CM | POA: Insufficient documentation

## 2020-08-07 DIAGNOSIS — Y842 Radiological procedure and radiotherapy as the cause of abnormal reaction of the patient, or of later complication, without mention of misadventure at the time of the procedure: Secondary | ICD-10-CM | POA: Insufficient documentation

## 2020-08-07 NOTE — Progress Notes (Addendum)
DANTA, BAUMGARDNER ([id number]) Visit Report for 08/07/2020 Chief Complaint Document Details Patient Name: Date of Service: [name], [name] 08/07/2020 1:15 PM Medical Record Number: [id number] Patient Account Number: [id number] Date of Birth/Sex: Treating RN: 1950-07-12 (70 y.o. [name] Primary Care Provider: [name], [place] Other Clinician: Referring Provider: Treating Provider/Extender: [name], [name] Weeks in Treatment: 0 Information Obtained from: Patient Chief Complaint 05/04/2019; patient is here for review of wound on his right lateral foot and right anterior lower leg 08/07/2020; patient is here for consideration of hyperbaric oxygen therapy for radiation cystitis Electronic Signature(s) Signed: 08/07/2020 5:25:01 PM By: Linton Ham MD Entered By: Linton Ham on 08/07/2020 14:25:15 -------------------------------------------------------------------------------- HPI Details Patient Name: Date of Service: [name], [place] 08/07/2020 1:15 PM Medical Record Number: [id number] Patient Account Number: [id number] Date of Birth/Sex: Treating RN: 09-23-1950 (70 y.o. [name] Primary Care Provider: [name], [place] Other Clinician: Referring Provider: Treating Provider/Extender: [name], [name] Weeks in Treatment: 0 History of Present Illness HPI Description: ADMISSION 05/04/2019 This is a 70 year old patient who spent a prolonged period of time in this clinic in 2014-2015 cared for by Dr. Jerline Pain with apparently predominantly wounds on the left plantar foot. This apparently healed up. He is a type II diabetic with a history of peripheral neuropathy and PAD. In September 2020 he ended up with a right fourth and fifth ray extensive amputation I believe by Dr. [name] for underlying osteomyelitis. Prior to the surgery he had arterial studies that were normal on the right with an ABI of 1.26 and triphasic waveforms and on the left noncompressible with  triphasic waveforms. Unfortunately they did not do TBI's. In any case he apparently developed a nonhealing area on the right lateral foot. He has been applying Silvadene to this. He uses his own running shoe with a custom insert. He has an AFO on the left which is fixed in the other shoe. He is here for a second opinion of this wound. Unfortunately 2 days ago he also had a fall and he has a considerably long laceration across the mid tibia. The only Steri-Stripped this they did not go to the ER this was not sutured although it probably should have been Past medical history; type 2 diabetes with a history of PAD and peripheral neuropathy, prostate CA treated with radiation, cervical spondylosis, left foot ulcer, history of osteomyelitis coronary artery disease status post CABG in 2005 recent fistula creation in the left arm in preparation for the dialysis, gout and a history of venous insufficiency ABIs were not done in this clinic as they were done in September 1.26 on the right and 2.18 on the left noncompressible. TBI's were not done but his waveforms were said to be triphasic bilaterally 05/13/2019; I reviewed the operative report from Dr. [name] on 12/14/2018. I think he went into the OR expecting to just have amputation of the fourth and fifth toes but after incision the margins were still completely infected and necrotic therefore the surgery was extended to encompass the metatarsals. Tissue was sent for culture this showed E. coli I am not really sure about the duration of his antibiotics whether he received this at dialysis or not. The patient thinks his wound has been present ever since the surgery. This is on the remanent of the metatarsals laterally. Last week hyper granulated this week really healthy looking with an epithelialized margin He also has a traumatic laceration which was Steri-Stripped by [place] last  week. This has open areas inferiorly but I did not remove the Steri-Strips this week. I  am going to give this another week and then remove them next week and deal with what is there. Finally we got a phone call from the transplant people at [facility]. They want to know if this is going to be healed whether he can be delayed for 6 months on the list. 2/12; patient has a small wound on the right lateral foot. This looks healthy. X-ray showed no acute bony abnormality no evidence of osteomyelitis. He is noted to have heavy vascular calcification however. He points out a new area on the tip of the left second toe with loss of the nail in this area this is the area where he has a AFO brace 2/19; both the patient's wound area on the right lateral foot and the left second toe are healed. With regards to the right lateral foot I think this is a pressure/friction area related to how he walks with his right foot in an AFO. I think the crucial thing here is pressure relief. The patient is going to go see people at [employer] and I support this He is anxious to be back on the transplant list at [facility] for kidney transplant or at least not to be removed from it. We will try to communicate with the transplant team at [place] 08/07/2020 This is a patient we previously had in this clinic for wounds on his lower extremity in the setting of type 2 diabetes. [See notes above]. He is currently here for consideration of hyperbaric oxygen referred by Dr. [name] of urology. The patient had a history of T2A prostate cancer treated with 40 radiation treatments a total of 78Gy finishing on 09/05/2016. He did very well. As far as I am aware his prostate cancer is effectively cured and he had really no symptoms up until March of this year. He required an extensive admission to hospital from 06/16/2020 through 07/01/2020 with gross hematuria refractory to outpatient management. He underwent Foley catheter placement and three-way irrigation. However he continued to have large hematuria with clots. He  underwent cystoscopic evaluation with fulguration on 06/17/2020 but his hematuria persisted requiring blood transfusion and continuing on continuous bladder irrigation. He underwent cystourethroscopy with fulguration on 3/12 and it was repeated on 2 occasions on 3/23 and 3/24. Since this timeframe he has continued to have moderate total stream hematuria with blood clots. He was back in the ER on 07/21/2020 with urinary retention requiring a Foley catheter but it was removed after a week. He continues to have obvious continued bleeding and has had no recent urinary retention. Continues to have blood clots Past medical history includes type 2 diabetes on dialysis for the last year which he is doing via home dialysis. He has a history of coronary artery disease with remote CABG, atrial flutter undergone an ablation, gastroesophageal reflux disease, right foot ulcers with previous ray amputations, right partial cuboid excision. Electronic Signature(s) Signed: 08/07/2020 5:25:01 PM By: Linton Ham MD Entered By: Linton Ham on 08/07/2020 14:32:15 -------------------------------------------------------------------------------- Physical Exam Details Patient Name: Date of Service: [name], [place] 08/07/2020 1:15 PM Medical Record Number: [id number] Patient Account Number: [id number] Date of Birth/Sex: Treating RN: Jul 26, 1950 (70 y.o. [name] Primary Care Provider: [name], [place] Other Clinician: Referring Provider: Treating Provider/Extender: [name], [name] Weeks in Treatment: 0 Constitutional Sitting or standing Blood Pressure is within target range for patient.. Pulse regular and within  target range for patient.Marland Kitchen Respirations regular, non-labored and within target range.. Temperature is normal and within the target range for the patient.Marland Kitchen Appears in no distress. Respiratory work of breathing is normal. Bilateral breath sounds are clear and equal in all lobes with no  wheezes, rales or rhonchi.. Cardiovascular Heart sounds are normal. Soft 2 out of 6 pansystolic murmur at the PMI. Electronic Signature(s) Signed: 08/07/2020 5:25:01 PM By: Linton Ham MD Entered By: Linton Ham on 08/07/2020 14:33:06 -------------------------------------------------------------------------------- Physician Orders Details Patient Name: Date of Service: [name], [place] 08/07/2020 1:15 PM Medical Record Number: [id number] Patient Account Number: [id number] Date of Birth/Sex: Treating RN: 1951-01-25 (70 y.o. [name] Primary Care Provider: Other Clinician: Matthew Folks Referring Provider: Treating Provider/Extender: [name], [name] Weeks in Treatment: 0 Verbal / Phone Orders: No Diagnosis Coding Follow-up Appointments ppointment in: - We will call you to schedule appt to start Hyperbaric Oxygen Therapy once we get approval from your insurance Return A Hyperbaric Oxygen Therapy Evaluate for HBO Therapy Indication: - Radiation Cystitis 2.5 ATA for 90 Minutes with 2 Five (5) Minute A Breaks ir Total Number of Treatments: - 40 One treatments per day (delivered Monday through Friday unless otherwise specified in Special Instructions below): Finger stick Blood Glucose Pre- and Post- HBOT Treatment. Follow Hyperbaric Oxygen Glycemia Protocol Afrin (Oxymetazoline HCL) 0.05% nasal spray - 1 spray in both nostrils daily as needed prior to HBO treatment for difficulty clearing ears GLYCEMIA INTERVENTIONS PROTOCOL PRE-HBO GLYCEMIA INTERVENTIONS ACTION INTERVENTION Obtain pre-HBO capillary blood glucose (ensure 1 physician order is in chart). A. Notify HBO physician and await physician orders. 2 If result is 70 mg/dl or below: B. If the result meets the hospital definition of a critical result, follow hospital policy. A. Give patient an 8 ounce Glucerna Shake, an 8 ounce Ensure, or 8 ounces of a Glucerna/Ensure equivalent  dietary supplement*. B. Wait 30 minutes. If result is 71 mg/dl to 130 mg/dl: C. Retest patients capillary blood glucose (CBG). D. If result greater than or equal to 110 mg/dl, proceed with HBO. If result less than 110 mg/dl, notify HBO physician and consider holding HBO. If result is 131 mg/dl to 249 mg/dl: A. Proceed with HBO. A. Notify HBO physician and await physician orders. B. It is recommended to hold HBO and do If result is 250 mg/dl or greater: blood/urine ketone testing. C. If the result meets the hospital definition of a critical result, follow hospital policy. POST-HBO GLYCEMIA INTERVENTIONS ACTION INTERVENTION Obtain post HBO capillary blood glucose (ensure 1 physician order is in chart). A. Notify HBO physician and await physician orders. 2 If result is 70 mg/dl or below: B. If the result meets the hospital definition of a critical result, follow hospital policy. A. Give patient an 8 ounce Glucerna Shake, an 8 ounce Ensure, or 8 ounces of a Glucerna/Ensure equivalent dietary supplement*. B. Wait 15 minutes for symptoms of If result is 71 mg/dl to 100 mg/dl: hypoglycemia (i.e. nervousness, anxiety, sweating, chills, clamminess, irritability, confusion, tachycardia or dizziness). C. If patient asymptomatic, discharge patient. If patient symptomatic, repeat capillary blood glucose (CBG) and notify HBO physician. If result is 101 mg/dl to 249 mg/dl: A. Discharge patient. A. Notify HBO physician and await physician orders. B. It is recommended to do blood/urine ketone If result is 250 mg/dl or greater: testing. C. If the result meets the hospital definition of a critical result, follow hospital policy. *Juice or candies are NOT equivalent products. If patient refuses  the Glucerna or Ensure, please consult the hospital dietitian for an appropriate substitute. Electronic Signature(s) Signed: 08/07/2020 5:25:01 PM By: Linton Ham MD Signed: 08/07/2020 5:39:21  PM By: Levan Hurst RN, BSN Entered By: Levan Hurst on 08/07/2020 13:58:28 -------------------------------------------------------------------------------- Problem List Details Patient Name: Date of Service: [name], [place] 08/07/2020 1:15 PM Medical Record Number: [id number] Patient Account Number: [id number] Date of Birth/Sex: Treating RN: May 04, 1950 (70 y.o. [name] Primary Care Provider: [name], [place] Other Clinician: Referring Provider: Treating Provider/Extender: [name], [name] Weeks in Treatment: 0 Active Problems ICD-10 Encounter Code Description Active Date MDM Diagnosis N30.41 Irradiation cystitis with hematuria 08/07/2020 No Yes Z85.46 Personal history of malignant neoplasm of prostate 08/07/2020 No Yes Inactive Problems Resolved Problems Electronic Signature(s) Signed: 08/07/2020 5:25:01 PM By: Linton Ham MD Entered By: Linton Ham on 08/07/2020 14:22:51 -------------------------------------------------------------------------------- Progress Note Details Patient Name: Date of Service: [name], [place] 08/07/2020 1:15 PM Medical Record Number: [id number] Patient Account Number: [id number] Date of Birth/Sex: Treating RN: 15-Aug-1950 (70 y.o. [name] Primary Care Provider: [name], [place] Other Clinician: Referring Provider: Treating Provider/Extender: [name], [name] Weeks in Treatment: 0 Subjective Chief Complaint Information obtained from Patient 05/04/2019; patient is here for review of wound on his right lateral foot and right anterior lower leg 08/07/2020; patient is here for consideration of hyperbaric oxygen therapy for radiation cystitis History of Present Illness (HPI) ADMISSION 05/04/2019 This is a 70 year old patient who spent a prolonged period of time in this clinic in 2014-2015 cared for by Dr. Jerline Pain with apparently predominantly wounds on the left plantar foot. This apparently healed up. He is a  type II diabetic with a history of peripheral neuropathy and PAD. In September 2020 he ended up with a right fourth and fifth ray extensive amputation I believe by Dr. [name] for underlying osteomyelitis. Prior to the surgery he had arterial studies that were normal on the right with an ABI of 1.26 and triphasic waveforms and on the left noncompressible with triphasic waveforms. Unfortunately they did not do TBI's. In any case he apparently developed a nonhealing area on the right lateral foot. He has been applying Silvadene to this. He uses his own running shoe with a custom insert. He has an AFO on the left which is fixed in the other shoe. He is here for a second opinion of this wound. Unfortunately 2 days ago he also had a fall and he has a considerably long laceration across the mid tibia. The only Steri-Stripped this they did not go to the ER this was not sutured although it probably should have been Past medical history; type 2 diabetes with a history of PAD and peripheral neuropathy, prostate CA treated with radiation, cervical spondylosis, left foot ulcer, history of osteomyelitis coronary artery disease status post CABG in 2005 recent fistula creation in the left arm in preparation for the dialysis, gout and a history of venous insufficiency ABIs were not done in this clinic as they were done in September 1.26 on the right and 2.18 on the left noncompressible. TBI's were not done but his waveforms were said to be triphasic bilaterally 05/13/2019; I reviewed the operative report from Dr. [name] on 12/14/2018. I think he went into the OR expecting to just have amputation of the fourth and fifth toes but after incision the margins were still completely infected and necrotic therefore the surgery was extended to encompass the metatarsals. Tissue was sent for culture this showed  E. coli I am not really sure about the duration of his antibiotics whether he received this at dialysis or not. The patient  thinks his wound has been present ever since the surgery. This is on the remanent of the metatarsals laterally. Last week hyper granulated this week really healthy looking with an epithelialized margin He also has a traumatic laceration which was Steri-Stripped by [place] last week. This has open areas inferiorly but I did not remove the Steri-Strips this week. I am going to give this another week and then remove them next week and deal with what is there. Finally we got a phone call from the transplant people at [facility]. They want to know if this is going to be healed whether he can be delayed for 6 months on the list. 2/12; patient has a small wound on the right lateral foot. This looks healthy. X-ray showed no acute bony abnormality no evidence of osteomyelitis. He is noted to have heavy vascular calcification however. He points out a new area on the tip of the left second toe with loss of the nail in this area this is the area where he has a AFO brace 2/19; both the patient's wound area on the right lateral foot and the left second toe are healed. With regards to the right lateral foot I think this is a pressure/friction area related to how he walks with his right foot in an AFO. I think the crucial thing here is pressure relief. The patient is going to go see people at [employer] and I support this He is anxious to be back on the transplant list at [facility] for kidney transplant or at least not to be removed from it. We will try to communicate with the transplant team at [place] 08/07/2020 This is a patient we previously had in this clinic for wounds on his lower extremity in the setting of type 2 diabetes. [See notes above]. He is currently here for consideration of hyperbaric oxygen referred by Dr. [name] of urology. The patient had a history of T2A prostate cancer treated with 40 radiation treatments a total of 78Gy finishing on 09/05/2016. He did very well. As far as I am aware his  prostate cancer is effectively cured and he had really no symptoms up until March of this year. He required an extensive admission to hospital from 06/16/2020 through 07/01/2020 with gross hematuria refractory to outpatient management. He underwent Foley catheter placement and three-way irrigation. However he continued to have large hematuria with clots. He underwent cystoscopic evaluation with fulguration on 06/17/2020 but his hematuria persisted requiring blood transfusion and continuing on continuous bladder irrigation. He underwent cystourethroscopy with fulguration on 3/12 and it was repeated on 2 occasions on 3/23 and 3/24. Since this timeframe he has continued to have moderate total stream hematuria with blood clots. He was back in the ER on 07/21/2020 with urinary retention requiring a Foley catheter but it was removed after a week. He continues to have obvious continued bleeding and has had no recent urinary retention. Continues to have blood clots Past medical history includes type 2 diabetes on dialysis for the last year which he is doing via home dialysis. He has a history of coronary artery disease with remote CABG, atrial flutter undergone an ablation, gastroesophageal reflux disease, right foot ulcers with previous ray amputations, right partial cuboid excision. Patient History Information obtained from Patient. Allergies mushroom (Severity: Moderate, Reaction: nausea) Family History Cancer - Father,Siblings, Diabetes - Mother,Father,Siblings, Heart Disease -  Father, Hypertension - Mother, Kidney Disease - Mother,Father, No family history of Hereditary Spherocytosis, Lung Disease, Seizures, Stroke, Thyroid Problems, Tuberculosis. Social History Former smoker - quit 30 yrs ago, Marital Status - Married, Alcohol Use - Never, Drug Use - No History, Caffeine Use - Daily - coffee. Medical History Cardiovascular Patient has history of Coronary Artery Disease, Hypertension Denies history of  Congestive Heart Failure Endocrine Patient has history of Type II Diabetes Denies history of Type I Diabetes Genitourinary Patient has history of End Stage Renal Disease - HD Integumentary (Skin) Denies history of History of Burn Musculoskeletal Patient has history of Gout, Osteomyelitis Denies history of Rheumatoid Arthritis, Osteoarthritis Neurologic Patient has history of Neuropathy Oncologic Patient has history of Received Radiation - 2018 Denies history of Received Chemotherapy Psychiatric Patient has history of Anorexia/bulimia, Confinement Anxiety Hospitalization/Surgery History - right 4th and fifth toe ray amputation. - left 5th toe amputation. - cervical fusion. - lumbar dickectomy. - CABG 4 vessel. - cholecystectomy. Medical A Surgical History Notes nd Cardiovascular hypercholesteremia Endocrine hypothyroidism Genitourinary CKD, prostate CA Musculoskeletal abnormal gait Neurologic cervical myelopathy Oncologic Prostate CA Psychiatric poor appetite secondary to nausea Review of Systems (ROS) Eyes Denies complaints or symptoms of Dry Eyes, Vision Changes, Glasses / Contacts. Ear/Nose/Mouth/Throat Denies complaints or symptoms of Chronic sinus problems or rhinitis. Respiratory Denies complaints or symptoms of Chronic or frequent coughs, Shortness of Breath. Integumentary (Skin) Denies complaints or symptoms of Wounds. Neurologic Denies complaints or symptoms of Numbness/parasthesias. Objective Constitutional Sitting or standing Blood Pressure is within target range for patient.. Pulse regular and within target range for patient.Marland Kitchen Respirations regular, non-labored and within target range.. Temperature is normal and within the target range for the patient.Marland Kitchen Appears in no distress. Vitals Time Taken: 1:25 PM, Height: 70 in, Source: Stated, Weight: 195 lbs, Source: Stated, BMI: 28, Temperature: 98.6 F, Pulse: 74 bpm, Respiratory Rate: 18 breaths/min, Blood  Pressure: 121/68 mmHg. Respiratory work of breathing is normal. Bilateral breath sounds are clear and equal in all lobes with no wheezes, rales or rhonchi.. Cardiovascular Heart sounds are normal. Soft 2 out of 6 pansystolic murmur at the PMI. Assessment Active Problems ICD-10 Irradiation cystitis with hematuria Personal history of malignant neoplasm of prostate HBO Evaluation Hx of prior radiation Patient was treated with radiation from 07/10/2016 through 09/05/2016. This was directed at the prostate 78 Gy in 40 fractions Location of STRN Patient has radiation cystitis of the bladder which began in early March/22 Description of symptoms Patient's symptoms began in early March/22. He developed microscopic hematuria with clots and episodes of urinary retention requiring Foley catheter placement. He has had bladder spasms during a prolonged hospitalization from 06/16/2020 through 07/01/2020 he required transfusion and 3 cystourethroscopic evaluations CT scan results CT scan of the abdomen pelvis on 05/05/2020 did not show any alternative pathology that could be contributing to gross hematuria Other tests Transurethral resection of the prostate gland on 06/28/2020 showed nodular prostatic hyperplasia with organized blood clots Plan of care/Summary This patient clearly has radiation cystitis and has had 3 recent cystoscopies during a complex hospitalization in March. Although he has recently been doing better he still has macroscopic hematuria with clot formation. The patient is a candidate for hyperbaric oxygen therapy at 2.5 atm with two 5-minute air breaks 40 treatments. I have cautioned the patient not to expect instantaneous improvements that it will take at least 20, more likely 30 treatments before he sees any improvement. The goal of therapy will be elimination of macroscopic hematuria without clot  formation. We will continue to follow him during HBO treatment to monitor his clinical  symptomatology. He also has follow-up appointments with Dr. [name] of urology. He has a cardiac history although has no current chest pain. His EKG is normal and a recent chest x-ray is also normal done on 05/26/2020 Plan Follow-up Appointments: Return Appointment in: - We will call you to schedule appt to start Hyperbaric Oxygen Therapy once we get approval from your insurance Hyperbaric Oxygen Therapy: Evaluate for HBO Therapy Indication: - Radiation Cystitis 2.5 ATA for 90 Minutes with 2 Five (5) Minute Air Breaks T Number of Treatments: - 40 otal One treatments per day (delivered Monday through Friday unless otherwise specified in Special Instructions below): Finger stick Blood Glucose Pre- and Post- HBOT Treatment. Follow Hyperbaric Oxygen Glycemia Protocol Afrin (Oxymetazoline HCL) 0.05% nasal spray - 1 spray in both nostrils daily as needed prior to HBO treatment for difficulty clearing ears 1. The patient is indeed a candidate for hyperbaric oxygen. 2. We will use 2.5 atm with 5-minute air breaks x2 for 40 treatments 3. The goal here will be cessation of the bleeding. 4. The patient was advised that about 80% of men have improvement or resolution of their symptoms 5. We went over side effects of hyperbaric oxygen including claustrophobia, tympanic membrane barotrauma, visual changes and the more rare serious side effects such as seizures or confusion. 6. He has a history of coronary artery disease and ablation of atrial flutter/fib. However apparently has a normal ejection fraction although I will review this. Other than this he does not have any serious pulmonary issues 7. He is also on home dialysis and as a result does not void that frequently perhaps once or twice a day. He does not describe dysuria or obstruction but he still is passing clots and having frank bleeding Electronic Signature(s) Signed: 08/11/2020 7:57:22 AM By: Linton Ham MD Previous Signature: 08/08/2020  4:28:01 PM Version By: Linton Ham MD Previous Signature: 08/07/2020 5:25:01 PM Version By: Linton Ham MD Entered By: Linton Ham on 08/11/2020 07:57:22 -------------------------------------------------------------------------------- HxROS Details Patient Name: Date of Service: [name], [place] 08/07/2020 1:15 PM Medical Record Number: [id number] Patient Account Number: [id number] Date of Birth/Sex: Treating RN: 03-01-1951 (69 y.o. Marcheta Grammes Primary Care Provider: [name], [place] Atha Starks Other Clinician: Referring Provider: Treating Provider/Extender: [name], [name] Weeks in Treatment: 0 Information Obtained From Patient Eyes Complaints and Symptoms: Negative for: Dry Eyes; Vision Changes; Glasses / Contacts Ear/Nose/Mouth/Throat Complaints and Symptoms: Negative for: Chronic sinus problems or rhinitis Respiratory Complaints and Symptoms: Negative for: Chronic or frequent coughs; Shortness of Breath Integumentary (Skin) Complaints and Symptoms: Negative for: Wounds Medical History: Negative for: History of Burn Neurologic Complaints and Symptoms: Negative for: Numbness/parasthesias Medical History: Positive for: Neuropathy Past Medical History Notes: cervical myelopathy Cardiovascular Medical History: Positive for: Coronary Artery Disease; Hypertension Negative for: Congestive Heart Failure Past Medical History Notes: hypercholesteremia Endocrine Medical History: Positive for: Type II Diabetes Negative for: Type I Diabetes Past Medical History Notes: hypothyroidism Time with diabetes: 7 yrs Treated with: Insulin Blood sugar tested every day: Yes Tested : once Blood sugar testing results: Breakfast: <100 Genitourinary Medical History: Positive for: End Stage Renal Disease - HD Past Medical History Notes: CKD, prostate CA Immunological Musculoskeletal Medical History: Positive for: Gout; Osteomyelitis Negative for: Rheumatoid  Arthritis; Osteoarthritis Past Medical History Notes: abnormal gait Oncologic Medical History: Positive for: Received Radiation - 2018 Negative for: Received Chemotherapy Past Medical History Notes: Prostate CA  Psychiatric Medical History: Positive for: Anorexia/bulimia; Confinement Anxiety Past Medical History Notes: poor appetite secondary to nausea Immunizations Pneumococcal Vaccine: Received Pneumococcal Vaccination: Yes Implantable Devices Yes Hospitalization / Surgery History Type of Hospitalization/Surgery right 4th and fifth toe ray amputation left 5th toe amputation cervical fusion lumbar dickectomy CABG 4 vessel cholecystectomy Family and Social History Cancer: Yes - Father,Siblings; Diabetes: Yes - Mother,Father,Siblings; Heart Disease: Yes - Father; Hereditary Spherocytosis: No; Hypertension: Yes - Mother; Kidney Disease: Yes - Mother,Father; Lung Disease: No; Seizures: No; Stroke: No; Thyroid Problems: No; Tuberculosis: No; Former smoker - quit 30 yrs ago; Marital Status - Married; Alcohol Use: Never; Drug Use: No History; Caffeine Use: Daily - coffee; Financial Concerns: No; Food, Clothing or Shelter Needs: No; Support System Lacking: No; Transportation Concerns: No Electronic Signature(s) Signed: 08/07/2020 5:21:21 PM By: Lorrin Jackson Signed: 08/07/2020 5:25:01 PM By: Linton Ham MD Entered By: Lorrin Jackson on 08/07/2020 13:30:51 -------------------------------------------------------------------------------- SuperBill Details Patient Name: Date of Service: [name], [place] 08/07/2020 Medical Record Number: [id number] Patient Account Number: [id number] Date of Birth/Sex: Treating RN: 1950/05/04 (70 y.o. [name] Primary Care Provider: [name], [place] Other Clinician: Referring Provider: Treating Provider/Extender: [name], [name] Weeks in Treatment: 0 Diagnosis Coding ICD-10 Codes Code Description N30.41 Irradiation cystitis  with hematuria Z85.46 Personal history of malignant neoplasm of prostate Facility Procedures CPT4 Code: 03128118 Description: 99213 - WOUND CARE VISIT-LEV 3 EST PT Modifier: Quantity: 1 Physician Procedures : CPT4 Code Description Modifier [id number] - WC PHYS LEVEL 4 - EST PT ICD-10 Diagnosis Description N30.41 Irradiation cystitis with hematuria Z85.46 Personal history of malignant neoplasm of prostate Quantity: 1 Electronic Signature(s) Signed: 08/08/2020 4:28:01 PM By: Linton Ham MD Previous Signature: 08/07/2020 5:25:01 PM Version By: Linton Ham MD Entered By: Linton Ham on 08/08/2020 07:24:42

## 2020-08-07 NOTE — Progress Notes (Signed)
Jon, [name] ([id number]) Visit Report for 08/07/2020 Abuse/Suicide Risk Screen Details Patient Name: Date of Service: [name], [name] 08/07/2020 1:15 PM Medical Record Number: [id number] Patient Account Number: [id number] Date of Birth/Sex: Treating RN: 08/23/1950 (70 y.o. Marcheta Grammes Primary Care Shadoe Cryan: [name], [place] Atha Starks Other Clinician: Referring Tinlee Navarrette: Treating Bethzy Hauck/Extender: [name], [name] Weeks in Treatment: 0 Abuse/Suicide Risk Screen Items Answer ABUSE RISK SCREEN: Has anyone close to you tried to hurt or harm you recentlyo No Do you feel uncomfortable with anyone in your familyo No Has anyone forced you do things that you didnt want to doo No Electronic Signature(s) Signed: 08/07/2020 5:21:21 PM By: Lorrin Jackson Entered By: Lorrin Jackson on 08/07/2020 13:30:59 -------------------------------------------------------------------------------- Activities of Daily Living Details Patient Name: Date of Service: [name], [name] 08/07/2020 1:15 PM Medical Record Number: [id number] Patient Account Number: [id number] Date of Birth/Sex: Treating RN: May 28, 1950 (70 y.o. Marcheta Grammes Primary Care Patrice Moates: [name], [place] Atha Starks Other Clinician: Referring Trestin Vences: Treating Rashaun Wichert/Extender: [name], [name] Weeks in Treatment: 0 Activities of Daily Living Items Answer Activities of Daily Living (Please select one for each item) Drive Automobile Not Able T Medications ake Completely Able Use T elephone Completely Able Care for Appearance Completely Able Use T oilet Completely Able Bath / Shower Completely Able Dress Self Completely Able Feed Self Completely Able Walk Need Assistance Get In / Out Bed Completely Able Housework Need Assistance Prepare Meals Need Assistance Handle Money Completely Able Shop for Self Need Assistance Electronic Signature(s) Signed: 08/07/2020 5:21:21 PM By: Lorrin Jackson Entered By: Lorrin Jackson on 08/07/2020  13:31:49 -------------------------------------------------------------------------------- Education Screening Details Patient Name: Date of Service: [name], [place] 08/07/2020 1:15 PM Medical Record Number: [id number] Patient Account Number: [id number] Date of Birth/Sex: Treating RN: [birth year] (70 y.o. Marcheta Grammes Primary Care Xenia Nile: [name], [place] Other Clinician: Referring Atheena Spano: Treating Ayane Delancey/Extender: [name], [name] Weeks in Treatment: 0 Primary Learner Assessed: Patient Learning Preferences/Education Level/Primary Language Learning Preference: Explanation, Demonstration, Printed Material Highest Education Level: College or Above Preferred Language: English Cognitive Barrier Language Barrier: No Translator Needed: No Memory Deficit: No Emotional Barrier: No Cultural/Religious Beliefs Affecting Medical Care: No Physical Barrier Impaired Vision: No Impaired Hearing: No Decreased Hand dexterity: No Knowledge/Comprehension Knowledge Level: High Comprehension Level: High Ability to understand written instructions: High Ability to understand verbal instructions: High Motivation Anxiety Level: Calm Cooperation: Cooperative Education Importance: Acknowledges Need Interest in Health Problems: Asks Questions Perception: Coherent Willingness to Engage in Self-Management High Activities: Readiness to Engage in Self-Management High Activities: Electronic Signature(s) Signed: 08/07/2020 5:21:21 PM By: Lorrin Jackson Entered By: Lorrin Jackson on 08/07/2020 13:32:29 -------------------------------------------------------------------------------- Fall Risk Assessment Details Patient Name: Date of Service: [name], [place] 08/07/2020 1:15 PM Medical Record Number: [id number] Patient Account Number: [id number] Date of Birth/Sex: Treating RN: [birth year] (70 y.o. Marcheta Grammes Primary Care Nafeesa Dils: [name], [place] TA LIE Other Clinician: Referring  Nevea Spiewak: Treating Alegra Rost/Extender: [name], [name] Weeks in Treatment: 0 Fall Risk Assessment Items Have you had 2 or more falls in the last 12 monthso 0 No Have you had any fall that resulted in injury in the last 12 monthso 0 No FALLS RISK SCREEN History of falling - immediate or within 3 months 0 No Secondary diagnosis (Do you have 2 or more medical diagnoseso) 0 No Ambulatory aid None/bed rest/wheelchair/nurse 0 No Crutches/cane/walker 15 Yes Furniture 0 No Intravenous therapy Access/Saline/Heparin Lock 0 No Gait/Transferring Normal/ bed rest/ wheelchair  0 Yes Weak (short steps with or without shuffle, stooped but able to lift head while walking, may seek 0 No support from furniture) Impaired (short steps with shuffle, may have difficulty arising from chair, head down, impaired 0 No balance) Mental Status Oriented to own ability 0 Yes Electronic Signature(s) Signed: 08/07/2020 5:21:21 PM By: Lorrin Jackson Entered By: Lorrin Jackson on 08/07/2020 13:32:43 -------------------------------------------------------------------------------- Foot Assessment Details Patient Name: Date of Service: [name], [place] 08/07/2020 1:15 PM Medical Record Number: [id number] Patient Account Number: [id number] Date of Birth/Sex: Treating RN: 09-10-1950 (70 y.o. Marcheta Grammes Primary Care Mikaiya Tramble: [name], [place] Atha Starks Other Clinician: Referring Lavanna Rog: Treating Sumedh Shinsato/Extender: [name], [name] Weeks in Treatment: 0 Foot Assessment Items Site Locations + = Sensation present, - = Sensation absent, C = Callus, U = Ulcer R = Redness, W = Warmth, M = Maceration, PU = Pre-ulcerative lesion F = Fissure, S = Swelling, D = Dryness Assessment Right: Left: Other Deformity: No No Prior Foot Ulcer: Yes Yes Prior Amputation: Yes Yes Charcot Joint: No No Ambulatory Status: Ambulatory With Help Assistance Device: Walker Gait: Steady Electronic  Signature(s) Signed: 08/07/2020 5:21:21 PM By: Lorrin Jackson Entered By: Lorrin Jackson on 08/07/2020 13:34:21 -------------------------------------------------------------------------------- Nutrition Risk Screening Details Patient Name: Date of ServiceDMITRY, MACOM[place] 08/07/2020 1:15 PM Medical Record Number: [id number] Patient Account Number: [id number] Date of Birth/Sex: Treating RN: 1950/12/07 (70 y.o. Marcheta Grammes Primary Care Merril Nagy: [name], [place] Atha Starks Other Clinician: Referring Novah Goza: Treating Jacoba Cherney/Extender: [name], [name] Weeks in Treatment: 0 Height (in): 70 Weight (lbs): 195 Body Mass Index (BMI): 28 Nutrition Risk Screening Items Score Screening NUTRITION RISK SCREEN: I have an illness or condition that made me change the kind and/or amount of food I eat 0 No I eat fewer than two meals per day 0 No I eat few fruits and vegetables, or milk products 0 No I have three or more drinks of beer, liquor or wine almost every day 0 No I have tooth or mouth problems that make it hard for me to eat 0 No I don't always have enough money to buy the food I need 0 No I eat alone most of the time 0 No I take three or more different prescribed or over-the-counter drugs a day 1 Yes Without wanting to, I have lost or gained 10 pounds in the last six months 0 No I am not always physically able to shop, cook and/or feed myself 0 No Nutrition Protocols Good Risk Protocol 0 No interventions needed Moderate Risk Protocol High Risk Proctocol Risk Level: Good Risk Score: 1 Electronic Signature(s) Signed: 08/07/2020 5:21:21 PM By: Lorrin Jackson Entered By: Lorrin Jackson on 08/07/2020 13:33:05

## 2020-08-07 NOTE — Progress Notes (Signed)
IRVAN, TIEDT ([id number]) Visit Report for 08/07/2020 Allergy List Details Patient Name: Date of Service: DAUNE, COLGATE 08/07/2020 1:15 PM Medical Record Number: [id number] Patient Account Number: [id number] Date of Birth/Sex: Treating RN: 1950-06-28 (70 y.o. Marcheta Grammes Primary Care Dawnna Gritz: [name], [place] Other Clinician: Referring Ivis Henneman: Treating Pleasant Bensinger/Extender: [name], [name] Weeks in Treatment: 0 Allergies Active Allergies mushroom Reaction: nausea Severity: Moderate Allergy Notes Electronic Signature(s) Signed: 08/07/2020 5:21:21 PM By: Lorrin Jackson Entered By: Lorrin Jackson on 08/07/2020 13:28:51 -------------------------------------------------------------------------------- Arrival Information Details Patient Name: Date of Service: [name], [place] 08/07/2020 1:15 PM Medical Record Number: [id number] Patient Account Number: [id number] Date of Birth/Sex: Treating RN: 05/17/1950 (70 y.o. Marcheta Grammes Primary Care Beniah Magnan: [name], [place] TA LIE Other Clinician: Referring Kelcy Laible: Treating Charliegh Vasudevan/Extender: [name], [name] Weeks in Treatment: 0 Visit Information Patient Arrived: Walker Arrival Time: 13:26 Accompanied By: wife Transfer Assistance: None Patient Identification Verified: Yes Secondary Verification Process Completed: Yes Patient Requires Transmission-Based Precautions: No Patient Has Alerts: Yes Patient Alerts: BP Right Arm Only History Since Last Visit Added or deleted any medications: No Any new allergies or adverse reactions: No Had a fall or experienced change in activities of daily living that may affect risk of falls: No Signs or symptoms of abuse/neglect since last visito No Hospitalized since last visit: No Implantable device outside of the clinic excluding cellular tissue based products placed in the center since last visit: No Pain Present Now: No Electronic Signature(s) Signed: 08/07/2020 5:21:21 PM By:  Lorrin Jackson Entered By: Lorrin Jackson on 08/07/2020 13:27:34 -------------------------------------------------------------------------------- Clinic Level of Care Assessment Details Patient Name: Date of Service: PARNELL, SPIELER 08/07/2020 1:15 PM Medical Record Number: [id number] Patient Account Number: [id number] Date of Birth/Sex: Treating RN: 08-Jul-1950 (70 y.o. [name] Primary Care Guila Owensby: [name], [place] Other Clinician: Referring Xzandria Clevinger: Treating Rilley Poulter/Extender: [name], [name] Weeks in Treatment: 0 Clinic Level of Care Assessment Items TOOL 2 Quantity Score X- 1 0 Use when only an EandM is performed on the INITIAL visit ASSESSMENTS - Nursing Assessment / Reassessment X- 1 20 General Physical Exam (combine w/ comprehensive assessment (listed just below) when performed on new pt. evals) X- 1 25 Comprehensive Assessment (HX, ROS, Risk Assessments, Wounds Hx, etc.) ASSESSMENTS - Wound and Skin A ssessment / Reassessment []  - 0 Simple Wound Assessment / Reassessment - one wound []  - 0 Complex Wound Assessment / Reassessment - multiple wounds []  - 0 Dermatologic / Skin Assessment (not related to wound area) ASSESSMENTS - Ostomy and/or Continence Assessment and Care []  - 0 Incontinence Assessment and Management []  - 0 Ostomy Care Assessment and Management (repouching, etc.) PROCESS - Coordination of Care X - Simple Patient / Family Education for ongoing care 1 15 []  - 0 Complex (extensive) Patient / Family Education for ongoing care X- 1 10 Staff obtains Programmer, systems, Records, T Results / Process Orders est []  - 0 Staff telephones HHA, Nursing Homes / Clarify orders / etc []  - 0 Routine Transfer to another Facility (non-emergent condition) []  - 0 Routine Hospital Admission (non-emergent condition) X- 1 15 New Admissions / Biomedical engineer / Ordering NPWT Apligraf, etc. , []  - 0 Emergency Hospital Admission (emergent  condition) X- 1 10 Simple Discharge Coordination []  - 0 Complex (extensive) Discharge Coordination PROCESS - Special Needs []  - 0 Pediatric / Minor Patient Management []  - 0 Isolation Patient Management []  - 0 Hearing / Language / Visual special needs []  -  0 Assessment of Community assistance (transportation, D/C planning, etc.) []  - 0 Additional assistance / Altered mentation []  - 0 Support Surface(s) Assessment (bed, cushion, seat, etc.) INTERVENTIONS - Wound Cleansing / Measurement []  - 0 Wound Imaging (photographs - any number of wounds) []  - 0 Wound Tracing (instead of photographs) []  - 0 Simple Wound Measurement - one wound []  - 0 Complex Wound Measurement - multiple wounds []  - 0 Simple Wound Cleansing - one wound []  - 0 Complex Wound Cleansing - multiple wounds INTERVENTIONS - Wound Dressings []  - 0 Small Wound Dressing one or multiple wounds []  - 0 Medium Wound Dressing one or multiple wounds []  - 0 Large Wound Dressing one or multiple wounds []  - 0 Application of Medications - injection INTERVENTIONS - Miscellaneous []  - 0 External ear exam []  - 0 Specimen Collection (cultures, biopsies, blood, body fluids, etc.) []  - 0 Specimen(s) / Culture(s) sent or taken to Lab for analysis []  - 0 Patient Transfer (multiple staff / Civil Service fast streamer / Similar devices) []  - 0 Simple Staple / Suture removal (25 or less) []  - 0 Complex Staple / Suture removal (26 or more) []  - 0 Hypo / Hyperglycemic Management (close monitor of Blood Glucose) []  - 0 Ankle / Brachial Index (ABI) - do not check if billed separately Has the patient been seen at the hospital within the last three years: Yes Total Score: 95 Level Of Care: New/Established - Level 3 Electronic Signature(s) Signed: 08/07/2020 5:39:21 PM By: Levan Hurst RN, BSN Entered By: Levan Hurst on 08/07/2020 14:04:09 -------------------------------------------------------------------------------- Encounter  Discharge Information Details Patient Name: Date of Service: [name], [place] 08/07/2020 1:15 PM Medical Record Number: [id number] Patient Account Number: [id number] Date of Birth/Sex: Treating RN: July 18, 1950 (71 y.o. Marcheta Grammes Primary Care Shine Scrogham: [name], [place] Other Clinician: Referring Kenneith Stief: Treating Schneider Warchol/Extender: [name], [name] Weeks in Treatment: 0 Encounter Discharge Information Items Discharge Condition: Stable Ambulatory Status: Walker Discharge Destination: Home Transportation: Private Auto Accompanied By: Wife Schedule Follow-up Appointment: Yes Clinical Summary of Care: Provided on 08/07/2020 Form Type Recipient Paper Patient Patient Electronic Signature(s) Signed: 08/07/2020 5:01:55 PM By: Lorrin Jackson Entered By: Lorrin Jackson on 08/07/2020 17:01:55 -------------------------------------------------------------------------------- Lower Extremity Assessment Details Patient Name: Date of Service: KEIVON, GARDEN 08/07/2020 1:15 PM Medical Record Number: [id number] Patient Account Number: [id number] Date of Birth/Sex: Treating RN: 06-Nov-1950 (70 y.o. Marcheta Grammes Primary Care Kiegan Macaraeg: [name], [place] Other Clinician: Referring Haylie Mccutcheon: Treating Murlene Revell/Extender: [name], [name] Weeks in Treatment: 0 Electronic Signature(s) Signed: 08/07/2020 5:21:21 PM By: Lorrin Jackson Entered By: Lorrin Jackson on 08/07/2020 13:34:29 -------------------------------------------------------------------------------- Multi Wound Chart Details Patient Name: Date of Service: [name], [place] 08/07/2020 1:15 PM Medical Record Number: [id number] Patient Account Number: [id number] Date of Birth/Sex: Treating RN: [birth year] (70 y.o. [name] Primary Care Aja Whitehair: [name], [place] Other Clinician: Referring Jaheem Hedgepath: Treating Shanay Woolman/Extender: [name], [name] Weeks in Treatment: 0 Vital Signs Height(in):  70 Pulse(bpm): 74 Weight(lbs): 195 Blood Pressure(mmHg): 121/68 Body Mass Index(BMI): 28 Temperature(F): 98.6 Respiratory Rate(breaths/min): 18 Wound Assessments Treatment Notes Electronic Signature(s) Signed: 08/07/2020 5:25:01 PM By: Linton Ham MD Signed: 08/07/2020 5:39:21 PM By: Levan Hurst RN, BSN Entered By: Linton Ham on 08/07/2020 14:24:21 -------------------------------------------------------------------------------- Multi-Disciplinary Care Plan Details Patient Name: Date of Service: [name], [place] 08/07/2020 1:15 PM Medical Record Number: [id number] Patient Account Number: [id number] Date of Birth/Sex: Treating RN: 1951-03-28 (70 y.o. [name] Primary Care Kalyse Meharg: [name],  [name] Other Clinician: Referring Liticia Gasior: Treating Avarie Tavano/Extender: [name], [name] Weeks in Treatment: 0 Multidisciplinary Care Plan reviewed with physician Active Inactive HBO Nursing Diagnoses: Anxiety related to feelings of confinement associated with the hyperbaric oxygen chamber Anxiety related to knowledge deficit of hyperbaric oxygen therapy and treatment procedures Discomfort related to temperature and humidity changes inside hyperbaric chamber Potential for barotraumas to ears, sinuses, teeth, and lungs or cerebral gas embolism related to changes in atmospheric pressure inside hyperbaric oxygen chamber Potential for oxygen toxicity seizures related to delivery of 100% oxygen at an increased atmospheric pressure Potential for pulmonary oxygen toxicity related to delivery of 100% oxygen at an increased atmospheric pressure Goals: Barotrauma will be prevented during HBO2 Date Initiated: 08/07/2020 T arget Resolution Date: 10/06/2020 Goal Status: Active Patient and/or family will be able to state/discuss factors appropriate to the management of their disease process during treatment Date Initiated: 08/07/2020 T arget Resolution Date: 10/06/2020 Goal  Status: Active Patient will tolerate the hyperbaric oxygen therapy treatment Date Initiated: 08/07/2020 T arget Resolution Date: 10/06/2020 Goal Status: Active Patient will tolerate the internal climate of the chamber Date Initiated: 08/07/2020 T arget Resolution Date: 10/06/2020 Goal Status: Active Patient/caregiver will verbalize understanding of HBO goals, rationale, procedures and potential hazards Date Initiated: 08/07/2020 T arget Resolution Date: 10/06/2020 Goal Status: Active Signs and symptoms of pulmonary oxygen toxicity will be recognized and promptly addressed Date Initiated: 08/07/2020 T arget Resolution Date: 10/06/2020 Goal Status: Active Signs and symptoms of seizure will be recognized and promptly addressed ; seizing patients will suffer no harm Date Initiated: 08/07/2020 T arget Resolution Date: 10/06/2020 Goal Status: Active Interventions: Administer a five (5) minute air break for patient if signs and symptoms of seizure appear and notify the hyperbaric physician Administer decongestants, per physician orders, prior to HBO2 Administer the correct therapeutic gas delivery based on the patients needs and limitations, per physician order Assess and provide for patients comfort related to the hyperbaric environment and equalization of middle ear Assess for signs and symptoms related to adverse events, including but not limited to confinement anxiety, pneumothorax, oxygen toxicity and baurotrauma Assess patient for any history of confinement anxiety Assess patient's knowledge and expectations regarding hyperbaric medicine and provide education related to the hyperbaric environment, goals of treatment and prevention of adverse events Implement protocols to decrease risk of pneumothorax in high risk patients Notes: Nutrition Nursing Diagnoses: Impaired glucose control: actual or potential Potential for alteratiion in Nutrition/Potential for imbalanced nutrition Goals: Patient/caregiver  agrees to and verbalizes understanding of need to use nutritional supplements and/or vitamins as prescribed Date Initiated: 08/07/2020 Target Resolution Date: 10/06/2020 Goal Status: Active Patient/caregiver will maintain therapeutic glucose control Date Initiated: 08/07/2020 Target Resolution Date: 10/06/2020 Goal Status: Active Interventions: Assess HgA1c results as ordered upon admission and as needed Assess patient nutrition upon admission and as needed per policy Provide education on elevated blood sugars and impact on wound healing Provide education on nutrition Notes: Electronic Signature(s) Signed: 08/07/2020 5:39:21 PM By: Levan Hurst RN, BSN Entered By: Levan Hurst on 08/07/2020 14:01:34 -------------------------------------------------------------------------------- Pain Assessment Details Patient Name: Date of Service: [name], [place] 08/07/2020 1:15 PM Medical Record Number: [id number] Patient Account Number: [id number] Date of Birth/Sex: Treating RN: Apr 18, 1950 (70 y.o. Marcheta Grammes Primary Care Ryelle Ruvalcaba: [name], [place] Other Clinician: Referring Kailand Seda: Treating Aceton Kinnear/Extender: [name], [name] Weeks in Treatment: 0 Active Problems Location of Pain Severity and Description of Pain Patient Has Paino No Site Locations Pain  Management and Medication Current Pain Management: Notes Denies pain Electronic Signature(s) Signed: 08/07/2020 5:21:21 PM By: Lorrin Jackson Entered By: Lorrin Jackson on 08/07/2020 13:34:45 -------------------------------------------------------------------------------- Patient/Caregiver Education Details Patient Name: Date of Service: [name], [place] 5/2/2022andnbsp1:15 PM Medical Record Number: [id number] Patient Account Number: [id number] Date of Birth/Gender: Treating RN: 11/19/1950 (70 y.o. [name] Primary Care Physician: [name], [place] TA [place] Other Clinician: Referring Physician: Treating Physician/Extender:  [name], [name] Weeks in Treatment: 0 Education Assessment Education Provided To: Patient Education Topics Provided Hyperbaric Oxygenation: Handouts: Hyperbaric Oxygen Methods: Explain/Verbal, Printed Responses: State content correctly Nutrition: Methods: Explain/Verbal Responses: State content correctly Electronic Signature(s) Signed: 08/07/2020 5:39:21 PM By: Levan Hurst RN, BSN Entered By: Levan Hurst on 08/07/2020 14:03:13 -------------------------------------------------------------------------------- [place] Details Patient Name: Date of Service: [name], [place] 08/07/2020 1:15 PM Medical Record Number: [id number] Patient Account Number: [id number] Date of Birth/Sex: Treating RN: [birth year] (70 y.o. Marcheta Grammes Primary Care Steffan Caniglia: [name], [place] TA [place] Other Clinician: Referring Laakea Pereira: Treating Graviela Nodal/Extender: [name], [name] Weeks in Treatment: 0 Vital Signs Time Taken: 13:25 Temperature (F): 98.6 Height (in): 70 Pulse (bpm): 74 Source: Stated Respiratory Rate (breaths/min): 18 Weight (lbs): 195 Blood Pressure (mmHg): 121/68 Source: Stated Reference Range: 80 - 120 mg / dl Body Mass Index (BMI): 28 Electronic Signature(s) Signed: 08/07/2020 5:21:21 PM By: Lorrin Jackson Entered By: Lorrin Jackson on 08/07/2020 13:28:26

## 2020-08-12 ENCOUNTER — Other Ambulatory Visit: Payer: Self-pay | Admitting: Internal Medicine

## 2020-08-14 ENCOUNTER — Other Ambulatory Visit: Payer: Self-pay

## 2020-08-14 ENCOUNTER — Encounter (HOSPITAL_BASED_OUTPATIENT_CLINIC_OR_DEPARTMENT_OTHER): Payer: Medicare Other | Admitting: Internal Medicine

## 2020-08-14 DIAGNOSIS — N3041 Irradiation cystitis with hematuria: Secondary | ICD-10-CM | POA: Diagnosis not present

## 2020-08-14 LAB — GLUCOSE, CAPILLARY
Glucose-Capillary: 187 mg/dL — ABNORMAL HIGH (ref 70–99)
Glucose-Capillary: 147 mg/dL — ABNORMAL HIGH (ref 70–99)

## 2020-08-14 NOTE — Progress Notes (Addendum)
CURLEY, HOGEN ([id number]) Visit Report for 08/14/2020 HBO Details Patient Name: Date of Service: [name], [name] 08/14/2020 8:00 A M Medical Record Number: [id number] Patient Account Number: [id number] Date of Birth/Sex: Treating RN: 1950/10/14 (70 y.o. [name] Primary Care [name]: [name], [place] Other Clinician: Referring [name]: Treating [name]: [name], [name] Weeks in Treatment: 1 HBO Treatment Course Details Treatment Course Number: 1 Ordering [name]: Jon Ham T Treatments Ordered: otal 40 HBO Treatment Start Date: 08/14/2020 HBO Indication: Late Effect of Radiation Notes: Radiation Cystitis HBO Treatment Details Treatment Number: 1 Patient Type: Outpatient Chamber Type: Monoplace Chamber Serial #: [id number] Treatment Protocol: 2.5 ATA with 90 minutes oxygen, with two 5 minute air breaks Treatment Details Compression Rate Down: 1.0 psi / minute De-Compression Rate Up: 1.5 psi / minute A breaks and breathing ir Compress Tx Pressure periods Decompress Decompress Begins Reached (leave unused spaces Begins Ends blank) Chamber Pressure (ATA 1 2.5 2.5 2.5 2.5 2.5 - - 2.5 1 ) Clock Time (24 hr) 08:35 08:57 09:27 09:32 10:02 10:07 - - 10:37 10:51 Treatment Length: 136 (minutes) Treatment Segments: 5 Vital Signs Capillary Blood Glucose Reference Range: 80 - 120 mg / dl HBO Diabetic Blood Glucose Intervention Range: <131 mg/dl or >249 mg/dl Time Vitals Blood Respiratory Capillary Blood Glucose Pulse Action Type: Pulse: Temperature: Taken: Pressure: Rate: Glucose (mg/dl): Meter #: Oximetry (%) Taken: Pre 08:11 109/49 78 18 98.2 187 Post 10:52 132/54 67 18 97.5 147 Treatment Response Treatment Toleration: Well Treatment Completion Status: Treatment Completed without Adverse Event Tearah Saulsbury Notes Patient's first treatment. He tolerated this reasonably well in spite of concerns about anxiety/claustrophobia. I did give him a  prescription for Ativan 1 mg prior to treatment going forward. Respiratory and cardiac exams were normal. Tympanic membranes were somewhat scarred but no other obvious abnormality Physician HBO Attestation: I certify that I supervised this HBO treatment in accordance with Medicare guidelines. A trained emergency response team is readily available per Yes hospital policies and procedures. Continue HBOT as ordered. Yes Electronic Signature(s) Signed: 08/15/2020 4:50:17 PM By: [name] Signed: 08/15/2020 6:10:24 PM By: [name] Previous Signature: 08/14/2020 4:39:47 PM Version By: [name] Entered By: [name] on 08/15/2020 11:42:00 -------------------------------------------------------------------------------- HBO Safety Checklist Details Patient Name: Date of Service: [name], [name] 08/14/2020 8:00 A M Medical Record Number: [id number] Patient Account Number: [id number] Date of Birth/Sex: Treating RN: [birth year] (70 y.o. [name] Primary Care Manuela Halbur: [name], [place] Other Clinician: Referring Archita Lomeli: Treating Rodolphe Edmonston/Extender: [name], [name] Weeks in Treatment: 1 HBO Safety Checklist Items Safety Checklist Consent Form Signed Patient voided / foley secured and emptied When did you last eato this morning, x2 eggs, x2 toast, coffee Last dose of injectable or oral agent Ostomy pouch emptied and vented if applicable NA All implantable devices assessed, documented and approved NA Intravenous access site secured and place NA Valuables secured Linens and cotton and cotton/polyester blend (less than 51% polyester) Personal oil-based products / skin lotions / body lotions removed Wigs or hairpieces removed NA Smoking or tobacco materials removed NA Books / newspapers / magazines / loose paper removed NA Cologne, aftershave, perfume and deodorant removed Jewelry removed (may wrap wedding band) Make-up removed NA Hair care  products removed Battery operated devices (external) removed NA Heating patches and chemical warmers removed NA Titanium eyewear removed Nail polish cured greater than 10 hours NA Casting material cured greater than 10 hours NA Hearing aids removed NA  Loose dentures or partials removed NA Prosthetics have been removed NA Patient demonstrates correct use of air break device (if applicable) Patient concerns have been addressed Patient grounding bracelet on and cord attached to chamber Specifics for Inpatients (complete in addition to above) Medication sheet sent with patient Intravenous medications needed or due during therapy sent with patient Drainage tubes (e.g. nasogastric tube or chest tube secured and vented) Endotracheal or Tracheotomy tube secured Cuff deflated of air and inflated with saline Airway suctioned Electronic Signature(s) Signed: 08/14/2020 6:03:56 PM By: [name] Entered By: [name] on 08/14/2020 11:11:00

## 2020-08-14 NOTE — Progress Notes (Signed)
JERRYL, HOLZHAUER ([id number]) Visit Report for 08/14/2020 SuperBill Details Patient Name: Date of Service: [name], [name] 08/14/2020 Medical Record Number: [id number] Patient Account Number: [id number] Date of Birth/Sex: Treating RN: June 05, 1950 (70 y.o. [name] Primary Care Provider: [name], [place] Other Clinician: Referring Provider: Treating Provider/Extender: [name], [name] Weeks in Treatment: 1 Diagnosis Coding ICD-10 Codes Code Description N30.41 Irradiation cystitis with hematuria Z85.46 Personal history of malignant neoplasm of prostate Facility Procedures CPT4 Code Description Modifier Quantity [id number]-(Facility Use Only) HBOT full body chamber, 70min , 5 Physician Procedures Quantity CPT4 Code Description Modifier [id number] - WC PHYS HYPERBARIC OXYGEN THERAPY 1 ICD-10 Diagnosis Description N30.41 Irradiation cystitis with hematuria Z85.46 Personal history of malignant neoplasm of prostate Electronic Signature(s) Signed: 08/14/2020 4:39:47 PM By: Linton Ham MD Signed: 08/14/2020 6:03:56 PM By: Deon Pilling Entered By: Deon Pilling on 08/14/2020 11:15:27

## 2020-08-14 NOTE — Progress Notes (Signed)
EYAN, HAGOOD ([id number]) Visit Report for 08/14/2020 Arrival Information Details Patient Name: Date of Service: [name], [name] 08/14/2020 8:00 A M Medical Record Number: [id number] Patient Account Number: [id number] Date of Birth/Sex: Treating RN: 08/31/1950 (70 y.o. [name] Primary Care Eliga Arvie: [name], [place] Other Clinician: Referring Milagros Middendorf: Treating Malachi Suderman/Extender: [name], [name] Weeks in Treatment: 1 Visit Information History Since Last Visit Added or deleted any medications: No Patient Arrived: [name] Any new allergies or adverse reactions: No Arrival Time: 08:11 Had a fall or experienced change in No Accompanied By: self activities of daily living that may affect Transfer Assistance: None risk of falls: Patient Identification Verified: Yes Signs or symptoms of abuse/neglect since last visito No Secondary Verification Process Completed: Yes Hospitalized since last visit: No Patient Requires Transmission-Based Precautions: No Implantable device outside of the clinic excluding No Patient Has Alerts: Yes cellular tissue based products placed in the center Patient Alerts: BP Right Arm Only since last visit: Pain Present Now: No Electronic Signature(s) Signed: 08/14/2020 6:03:56 PM By: Deon Pilling Entered By: Deon Pilling on 08/14/2020 11:10:03 -------------------------------------------------------------------------------- Encounter Discharge Information Details Patient Name: Date of Service: [name], [place] 08/14/2020 8:00 A M Medical Record Number: [id number] Patient Account Number: [id number] Date of Birth/Sex: Treating RN: 01-03-1951 (70 y.o. [name] Primary Care Cynda Soule: [name], [place] Other Clinician: Referring Baljit Liebert: Treating Sanyia Dini/Extender: [name], [name] Weeks in Treatment: 1 Encounter Discharge Information Items Discharge Condition: Stable Ambulatory Status: Walker Discharge Destination:  Home Transportation: Private Auto Accompanied By: self Schedule Follow-up Appointment: Yes Clinical Summary of Care: Electronic Signature(s) Signed: 08/14/2020 6:03:56 PM By: Deon Pilling Entered By: Deon Pilling on 08/14/2020 11:15:50 -------------------------------------------------------------------------------- Vitals Details Patient Name: Date of Service: [name], [place] 08/14/2020 8:00 A M Medical Record Number: [id number] Patient Account Number: [id number] Date of Birth/Sex: Treating RN: 09-08-1950 (70 y.o. [name] Primary Care Corrado Hymon: [name], [place] Other Clinician: Referring Shareese Macha: Treating Ivry Pigue/Extender: [name], [name] Weeks in Treatment: 1 Vital Signs Time Taken: 08:11 Temperature (F): 98.2 Height (in): 70 Pulse (bpm): 78 Weight (lbs): 195 Respiratory Rate (breaths/min): 18 Body Mass Index (BMI): 28 Blood Pressure (mmHg): 109/49 Capillary Blood Glucose (mg/dl): 187 Reference Range: 80 - 120 mg / dl Electronic Signature(s) Signed: 08/14/2020 6:03:56 PM By: Deon Pilling Entered By: Deon Pilling on 08/14/2020 11:10:18

## 2020-08-15 ENCOUNTER — Encounter (HOSPITAL_BASED_OUTPATIENT_CLINIC_OR_DEPARTMENT_OTHER): Payer: Medicare Other | Admitting: Internal Medicine

## 2020-08-15 ENCOUNTER — Other Ambulatory Visit: Payer: Self-pay

## 2020-08-15 DIAGNOSIS — N3041 Irradiation cystitis with hematuria: Secondary | ICD-10-CM | POA: Diagnosis not present

## 2020-08-15 LAB — GLUCOSE, CAPILLARY
Glucose-Capillary: 177 mg/dL — ABNORMAL HIGH (ref 70–99)
Glucose-Capillary: 134 mg/dL — ABNORMAL HIGH (ref 70–99)

## 2020-08-16 ENCOUNTER — Other Ambulatory Visit: Payer: Self-pay

## 2020-08-16 ENCOUNTER — Encounter (HOSPITAL_BASED_OUTPATIENT_CLINIC_OR_DEPARTMENT_OTHER): Payer: Medicare Other | Admitting: Physician Assistant

## 2020-08-16 DIAGNOSIS — N3041 Irradiation cystitis with hematuria: Secondary | ICD-10-CM | POA: Diagnosis not present

## 2020-08-16 LAB — GLUCOSE, CAPILLARY
Glucose-Capillary: 202 mg/dL — ABNORMAL HIGH (ref 70–99)
Glucose-Capillary: 127 mg/dL — ABNORMAL HIGH (ref 70–99)

## 2020-08-16 NOTE — Progress Notes (Signed)
[name], [name] ([id number]) Visit Report for 08/15/2020 [place] Details Patient Name: Date of Service: [name], [name] 08/15/2020 Medical Record Number: [id number] Patient Account Number: [id number] Date of Birth/Sex: Treating RN: Oct 13, 1950 (70 y.o. 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

## 2020-08-16 NOTE — Progress Notes (Signed)
[name], [name] ([id number]) Visit Report for 08/15/2020 Arrival Information Details Patient Name: Date of Service: [name], [name] 08/15/2020 8:00 A M Medical Record Number: [id number] Patient Account Number: [id number] Date of Birth/Sex: Treating RN: 03-20-1951 (70 y.o. [name] Primary Care Jasmyn Picha: [name], [place] Other Clinician: Referring Gizell Danser: Treating Howell Groesbeck/Extender: [name], [name] Weeks in Treatment: 1 Visit Information History Since Last Visit Added or deleted any medications: No Patient Arrived: [name] Any new allergies or adverse reactions: No Arrival Time: 07:53 Had a fall or experienced change in No Accompanied By: alone activities of daily living that may affect Transfer Assistance: None risk of falls: Patient Identification Verified: Yes Signs or symptoms of abuse/neglect since last visito No Secondary Verification Process Completed: Yes Hospitalized since last visit: No Patient Requires Transmission-Based Precautions: No Implantable device outside of the clinic excluding No Patient Has Alerts: Yes cellular tissue based products placed in the center Patient Alerts: BP Right Arm Only since last visit: Pain Present Now: No Electronic Signature(s) Signed: 08/16/2020 5:58:33 PM By: Levan Hurst RN, BSN Entered By: Levan Hurst on 08/15/2020 09:07:23 -------------------------------------------------------------------------------- Encounter Discharge Information Details Patient Name: Date of Service: [name], [name] 08/15/2020 8:00 [place] Record Number: [id number] Patient Account Number: [id number] Date of Birth/Sex: Treating RN: 09-26-1950 (70 y.o. [name] Primary Care Gerrod Maule: [name], [place] Other Clinician: Referring Jadiel Schmieder: Treating Cullan Launer/Extender: [name], [name] Weeks in Treatment: 1 Encounter Discharge Information Items Discharge Condition: Stable Ambulatory Status: Walker Discharge  Destination: Home Transportation: Private Auto Accompanied By: alone Schedule Follow-up Appointment: Yes Clinical Summary of Care: Patient Declined Electronic Signature(s) Signed: 08/16/2020 5:58:33 PM By: Levan Hurst RN, BSN Entered By: Levan Hurst on 08/15/2020 11:22:17 -------------------------------------------------------------------------------- [place] Details Patient Name: Date of Service: [name], [name] 08/15/2020 8:00 A M Medical Record Number: [id number] Patient Account Number: [id number] Date of Birth/Sex: Treating RN: 11-Feb-1951 (70 y.o. [name] Primary Care Jinelle Butchko: [name], [place] Other Clinician: Referring Matthe Sloane: Treating Jevaeh Shams/Extender: [name], [name] Weeks in Treatment: 1 Vital Signs Time Taken: 07:53 Temperature (F): 97.9 Height (in): 70 Pulse (bpm): 73 Weight (lbs): 195 Respiratory Rate (breaths/min): 16 Body Mass Index (BMI): 28 Blood Pressure (mmHg): 104/43 Capillary Blood Glucose (mg/dl): 177 Reference Range: 80 - 120 mg / dl Electronic Signature(s) Signed: 08/16/2020 5:58:33 PM By: Levan Hurst RN, BSN Entered By: Levan Hurst on 08/15/2020 09:07:45

## 2020-08-16 NOTE — Progress Notes (Signed)
[name], [name] ([id number]) Visit Report for 08/16/2020 [place] Details Patient Name: Date of Service: [name], [name] 08/16/2020 Medical Record Number: [id number] Patient Account Number: [id number] Date of Birth/Sex: Treating RN: [birth year] (70 y.o. 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

## 2020-08-16 NOTE — Progress Notes (Signed)
[name], [name] ([id number]) Visit Report for 08/15/2020 HBO Details Patient Name: Date of Service: [name], [name] 08/15/2020 8:00 A M Medical Record Number: [id number] Patient Account Number: [id number] Date of Birth/Sex: Treating RN: 06/08/1950 (70 y.o. [name] Primary Care Meyli Boice: [name], [place] Other Clinician: Referring Davon Abdelaziz: Treating Silvano Garofano/Extender: [name], [name] Weeks in Treatment: 1 HBO Treatment Course Details Treatment Course Number: 1 Ordering Larin Weissberg: Linton Ham T Treatments Ordered: otal 40 HBO Treatment Start Date: 08/14/2020 HBO Indication: Late Effect of Radiation Notes: Radiation Cystitis HBO Treatment Details Treatment Number: 2 Patient Type: Outpatient Chamber Type: Monoplace Chamber Serial #: [id number] Treatment Protocol: 2.5 ATA with 90 minutes oxygen, with two 5 minute air breaks Treatment Details Compression Rate Down: 1.5 psi / minute De-Compression Rate Up: 2.0 psi / minute A breaks and breathing ir Compress Tx Pressure periods Decompress Decompress Begins Reached (leave unused spaces Begins Ends blank) Chamber Pressure (ATA 1 2.5 2.5 2.5 2.5 2.5 - - 2.5 1 ) Clock Time (24 hr) 08:09 08:24 08:54 08:59 09:29 09:34 - - 10:04 10:16 Treatment Length: 127 (minutes) Treatment Segments: 4 Vital Signs Capillary Blood Glucose Reference Range: 80 - 120 mg / dl HBO Diabetic Blood Glucose Intervention Range: <131 mg/dl or >249 mg/dl Time Vitals Blood Respiratory Capillary Blood Glucose Pulse Action Type: Pulse: Temperature: Taken: Pressure: Rate: Glucose (mg/dl): Meter #: Oximetry (%) Taken: Pre 07:53 104/43 73 16 97.9 177 Post 10:16 120/52 46 16 97.5 134 Treatment Response Treatment Completion Status: Treatment Completed without Adverse Event Aliou Mealey Notes No concerns with treatment given Physician HBO Attestation: I certify that I supervised this HBO treatment in accordance with Medicare guidelines. A trained  emergency response team is readily available per Yes hospital policies and procedures. Continue HBOT as ordered. Yes Electronic Signature(s) Signed: 08/15/2020 4:50:17 PM By: Linton Ham MD Entered By: Linton Ham on 08/15/2020 15:16:38 -------------------------------------------------------------------------------- HBO Safety Checklist Details Patient Name: Date of Service: [name], [place] 08/15/2020 8:00 A M Medical Record Number: [id number] Patient Account Number: [id number] Date of Birth/Sex: Treating RN: 04/04/1951 (70 y.o. [name] Primary Care Jabari Swoveland: [name], [place] Other Clinician: Referring Taquana Bartley: Treating Evalene Vath/Extender: [name], [name] Weeks in Treatment: 1 HBO Safety Checklist Items Safety Checklist Consent Form Signed Patient voided / foley secured and emptied When did you last eato 0715 - 2 boiled eggs, toast Last dose of injectable or oral agent na Ostomy pouch emptied and vented if applicable NA All implantable devices assessed, documented and approved NA Intravenous access site secured and place NA Valuables secured Linens and cotton and cotton/polyester blend (less than 51% polyester) Personal oil-based products / skin lotions / body lotions removed Wigs or hairpieces removed Smoking or tobacco materials removed Books / newspapers / magazines / loose paper removed Cologne, aftershave, perfume and deodorant removed Jewelry removed (may wrap wedding band) Make-up removed Hair care products removed Battery operated devices (external) removed Heating patches and chemical warmers removed Titanium eyewear removed Nail polish cured greater than 10 hours NA Casting material cured greater than 10 hours NA Hearing aids removed Loose dentures or partials removed Prosthetics have been removed NA Patient demonstrates correct use of air break device (if applicable) Patient concerns have been addressed Patient grounding  bracelet on and cord attached to chamber Specifics for Inpatients (complete in addition to above) Medication sheet sent with patient NA Intravenous medications needed or due during therapy sent with patient NA Drainage tubes (e.g. nasogastric tube or chest tube  secured and vented) NA Endotracheal or Tracheotomy tube secured NA Cuff deflated of air and inflated with saline NA Airway suctioned NA Electronic Signature(s) Signed: 08/16/2020 5:58:33 PM By: Levan Hurst RN, BSN Entered By: Levan Hurst on 08/15/2020 09:08:47

## 2020-08-16 NOTE — Progress Notes (Signed)
ALEN, MATHESON ([id number]) Visit Report for 08/16/2020 HBO Details Patient Name: Date of Service: [name], [name] 08/16/2020 8:00 A M Medical Record Number: [id number] Patient Account Number: [id number] Date of Birth/Sex: Treating RN: [birth year] (70 y.o. [name] Primary Care Damontay Alred: [name], [place] TA LIE Other Clinician: Referring Lurdes Haltiwanger: Treating Maiyah Goyne/Extender: Doyle Askew, [name] Weeks in Treatment: 1 HBO Treatment Course Details Treatment Course Number: 1 Ordering Novali Vollman: Linton Ham T Treatments Ordered: otal 40 HBO Treatment Start Date: 08/14/2020 HBO Indication: Late Effect of Radiation Notes: Radiation Cystitis HBO Treatment Details Treatment Number: 3 Patient Type: Outpatient Chamber Type: Monoplace Chamber Serial #: [id number] Treatment Protocol: 2.5 ATA with 90 minutes oxygen, with two 5 minute air breaks Treatment Details Compression Rate Down: 2.0 psi / minute De-Compression Rate Up: 2.0 psi / minute A breaks and breathing ir Compress Tx Pressure periods Decompress Decompress Begins Reached (leave unused spaces Begins Ends blank) Chamber Pressure (ATA 1 2.5 2.5 2.5 2.5 2.5 - - 2.5 1 ) Clock Time (24 hr) 08:05 08:17 08:47 08:52 09:22 09:27 - - 09:57 10:07 Treatment Length: 122 (minutes) Treatment Segments: 4 Vital Signs Capillary Blood Glucose Reference Range: 80 - 120 mg / dl HBO Diabetic Blood Glucose Intervention Range: <131 mg/dl or >249 mg/dl Time Vitals Blood Respiratory Capillary Blood Glucose Pulse Action Type: Pulse: Temperature: Taken: Pressure: Rate: Glucose (mg/dl): Meter #: Oximetry (%) Taken: Pre 07:56 113/43 72 18 97.6 202 Post 10:09 120/56 67 16 97.5 127 Treatment Response Treatment Toleration: Well Treatment Completion Status: Treatment Completed without Adverse Event Electronic Signature(s) Signed: 08/16/2020 5:30:57 PM By: Deon Pilling Signed: 08/16/2020 5:37:47 PM By: Worthy Keeler PA-C Entered By: Deon Pilling  on 08/16/2020 10:39:55 -------------------------------------------------------------------------------- HBO Safety Checklist Details Patient Name: Date of Service: [name], [name] 08/16/2020 8:00 A M Medical Record Number: [id number] Patient Account Number: [id number] Date of Birth/Sex: Treating RN: 09-Oct-1950 (70 y.o. [name] Primary Care Dyshon Philbin: [name], [place] Other Clinician: Referring Shamiya Demeritt: Treating Loranda Mastel/Extender: Doyle Askew, [name] Weeks in Treatment: 1 HBO Safety Checklist Items Safety Checklist Consent Form Signed Patient voided / foley secured and emptied When did you last eato this morning 2 boiled eggs, toast, coffee Last dose of injectable or oral agent weekly Ostomy pouch emptied and vented if applicable NA All implantable devices assessed, documented and approved NA Intravenous access site secured and place NA Valuables secured Linens and cotton and cotton/polyester blend (less than 51% polyester) Personal oil-based products / skin lotions / body lotions removed Wigs or hairpieces removed NA Smoking or tobacco materials removed NA Books / newspapers / magazines / loose paper removed NA Cologne, aftershave, perfume and deodorant removed Jewelry removed (may wrap wedding band) Make-up removed NA Hair care products removed Battery operated devices (external) removed NA Heating patches and chemical warmers removed NA Titanium eyewear removed Nail polish cured greater than 10 hours NA Casting material cured greater than 10 hours NA Hearing aids removed NA Loose dentures or partials removed NA Prosthetics have been removed NA Patient demonstrates correct use of air break device (if applicable) Patient concerns have been addressed Patient grounding bracelet on and cord attached to chamber Specifics for Inpatients (complete in addition to above) Medication sheet sent with patient Intravenous medications needed or due during  therapy sent with patient Drainage tubes (e.g. nasogastric tube or chest tube secured and vented) Endotracheal or Tracheotomy tube secured Cuff deflated of air and inflated with saline Airway suctioned Electronic Signature(s) Signed: 08/16/2020  5:30:57 PM By: Deon Pilling Entered By: Deon Pilling on 08/16/2020 09:53:18

## 2020-08-16 NOTE — Progress Notes (Signed)
[name], [name] ([id number]) Visit Report for 08/16/2020 Arrival Information Details Patient Name: Date of Service: [name], [name] 08/16/2020 8:00 A M Medical Record Number: [id number] Patient Account Number: [id number] Date of Birth/Sex: Treating RN: 1951-01-14 (70 y.o. [name], [name] Primary Care Itzael Liptak: [name], [place] Other Clinician: Referring Tesneem Dufrane: Treating Jasira Robinson/Extender: Doyle Askew, [name] Weeks in Treatment: 1 Visit Information History Since Last Visit Added or deleted any medications: No Patient Arrived: [place] Any new allergies or adverse reactions: No Arrival Time: 07:56 Had a fall or experienced change in No Accompanied By: self activities of daily living that may affect Transfer Assistance: None risk of falls: Patient Identification Verified: Yes Signs or symptoms of abuse/neglect since last visito No Secondary Verification Process Completed: Yes Hospitalized since last visit: No Patient Requires Transmission-Based Precautions: No Implantable device outside of the clinic excluding No Patient Has Alerts: Yes cellular tissue based products placed in the center Patient Alerts: BP Right Arm Only since last visit: Pain Present Now: No Electronic Signature(s) Signed: 08/16/2020 5:30:57 PM By: Deon Pilling Entered By: Deon Pilling on 08/16/2020 09:51:22 -------------------------------------------------------------------------------- Encounter Discharge Information Details Patient Name: Date of Service: [name], [name] 08/16/2020 8:00 A M Medical Record Number: [id number] Patient Account Number: [id number] Date of Birth/Sex: Treating RN: December 12, 1950 (70 y.o. [name] Primary Care Eliberto Sole: [name], [place] Other Clinician: Referring Lyssa Hackley: Treating Marcellas Marchant/Extender: Doyle Askew, [name] Weeks in Treatment: 1 Encounter Discharge Information Items Discharge Condition: Stable Ambulatory Status: [place] Discharge Destination:  Home Transportation: Private Auto Accompanied By: self Schedule Follow-up Appointment: Yes Clinical Summary of Care: Electronic Signature(s) Signed: 08/16/2020 5:30:57 PM By: Deon Pilling Entered By: Deon Pilling on 08/16/2020 10:40:38 -------------------------------------------------------------------------------- Vitals Details Patient Name: Date of Service: [name], [name] 08/16/2020 8:00 A M Medical Record Number: [id number] Patient Account Number: [id number] Date of Birth/Sex: Treating RN: 1950/07/25 (70 y.o. [name] Primary Care Khair Chasteen: [name], [place] Other Clinician: Referring Qianna Clagett: Treating [name]/Extender: Doyle Askew, [name] Weeks in Treatment: 1 Vital Signs Time Taken: 07:56 Temperature (F): 97.6 Height (in): 70 Pulse (bpm): 72 Weight (lbs): 195 Respiratory Rate (breaths/min): 18 Body Mass Index (BMI): 28 Blood Pressure (mmHg): 113/43 Capillary Blood Glucose (mg/dl): 202 Reference Range: 80 - 120 mg / dl Electronic Signature(s) Signed: 08/16/2020 5:30:57 PM By: Deon Pilling Entered By: Deon Pilling on 08/16/2020 09:51:42

## 2020-08-17 ENCOUNTER — Encounter (HOSPITAL_BASED_OUTPATIENT_CLINIC_OR_DEPARTMENT_OTHER): Payer: Medicare Other | Admitting: Internal Medicine

## 2020-08-17 ENCOUNTER — Other Ambulatory Visit: Payer: Self-pay

## 2020-08-17 DIAGNOSIS — N3041 Irradiation cystitis with hematuria: Secondary | ICD-10-CM | POA: Diagnosis not present

## 2020-08-17 LAB — GLUCOSE, CAPILLARY
Glucose-Capillary: 197 mg/dL — ABNORMAL HIGH (ref 70–99)
Glucose-Capillary: 158 mg/dL — ABNORMAL HIGH (ref 70–99)

## 2020-08-18 ENCOUNTER — Encounter (HOSPITAL_BASED_OUTPATIENT_CLINIC_OR_DEPARTMENT_OTHER): Payer: Medicare Other | Admitting: Internal Medicine

## 2020-08-18 ENCOUNTER — Other Ambulatory Visit: Payer: Self-pay

## 2020-08-18 DIAGNOSIS — N3041 Irradiation cystitis with hematuria: Secondary | ICD-10-CM | POA: Diagnosis not present

## 2020-08-18 LAB — GLUCOSE, CAPILLARY
Glucose-Capillary: 202 mg/dL — ABNORMAL HIGH (ref 70–99)
Glucose-Capillary: 129 mg/dL — ABNORMAL HIGH (ref 70–99)

## 2020-08-21 ENCOUNTER — Encounter (HOSPITAL_BASED_OUTPATIENT_CLINIC_OR_DEPARTMENT_OTHER): Payer: Medicare Other | Admitting: Internal Medicine

## 2020-08-21 ENCOUNTER — Other Ambulatory Visit: Payer: Self-pay

## 2020-08-21 DIAGNOSIS — N3041 Irradiation cystitis with hematuria: Secondary | ICD-10-CM | POA: Diagnosis not present

## 2020-08-21 LAB — GLUCOSE, CAPILLARY
Glucose-Capillary: 181 mg/dL — ABNORMAL HIGH (ref 70–99)
Glucose-Capillary: 145 mg/dL — ABNORMAL HIGH (ref 70–99)

## 2020-08-22 ENCOUNTER — Other Ambulatory Visit: Payer: Self-pay

## 2020-08-22 ENCOUNTER — Encounter (HOSPITAL_BASED_OUTPATIENT_CLINIC_OR_DEPARTMENT_OTHER): Payer: Medicare Other | Admitting: Internal Medicine

## 2020-08-22 DIAGNOSIS — N3041 Irradiation cystitis with hematuria: Secondary | ICD-10-CM | POA: Diagnosis not present

## 2020-08-22 LAB — GLUCOSE, CAPILLARY
Glucose-Capillary: 153 mg/dL — ABNORMAL HIGH (ref 70–99)
Glucose-Capillary: 170 mg/dL — ABNORMAL HIGH (ref 70–99)

## 2020-08-22 IMAGING — MR MR FOOT*R* W/O CM
5 series · 40 of 40 positions shown · non-contrast
Comparison: None.

CLINICAL DATA: O: On the lateral aspect around the right fifth
digit.

EXAM:
MRI OF THE RIGHT FOREFOOT WITHOUT CONTRAST
TECHNIQUE: Multiplanar, multisequence MR imaging of the right forefoot was
performed. No intravenous contrast was administered.

[Series 4: T1 · coronal · right · 3.0mm · 0.47mm/px · 11 of 36 slices shown (1 of 2)]
[im 1/36]
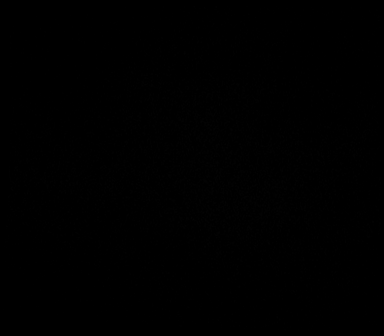
[im 4/36]
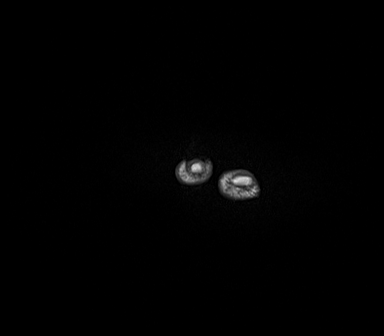
[im 8/36]
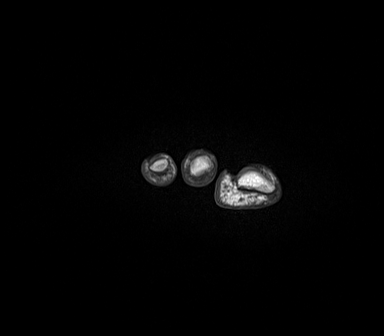
[im 11/36]
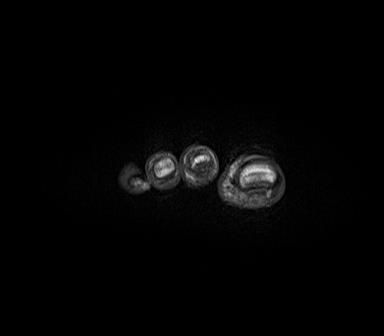
[im 15/36]
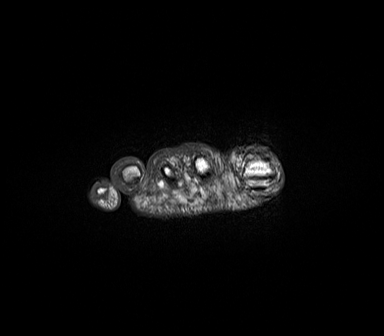
[im 18/36]
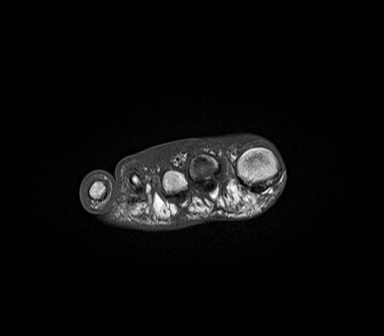
[im 22/36]
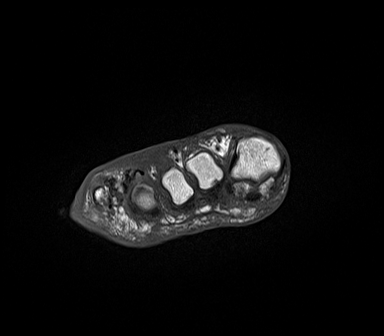
[im 25/36]
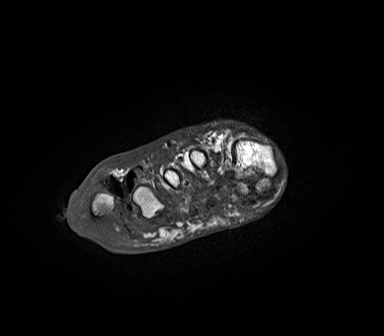
[im 29/36]
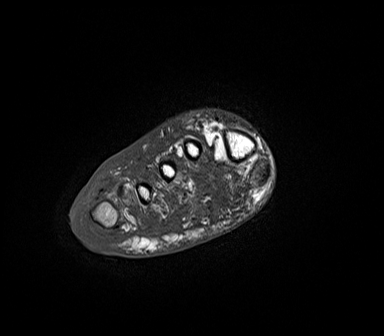
[im 32/36]
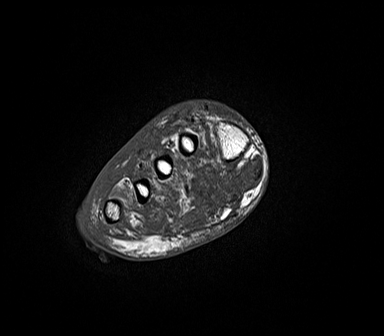
[im 36/36]
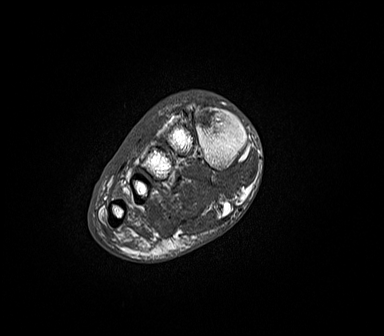

[Series 5: T2 fat-sat · coronal · right · 3.0mm · 0.49mm/px · 10 of 36 slices shown (1 of 2)]
[im 1/36]
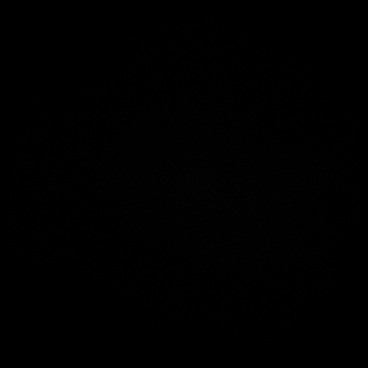
[im 4/36]
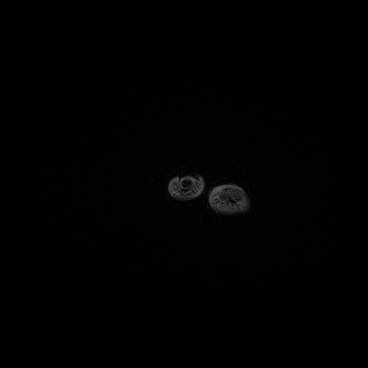
[im 8/36]
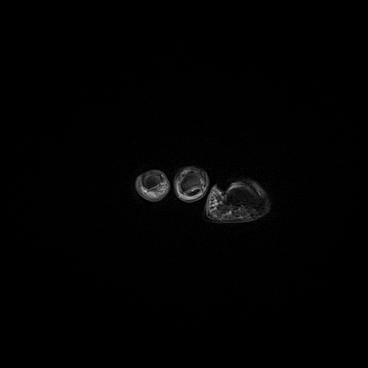
[im 12/36]
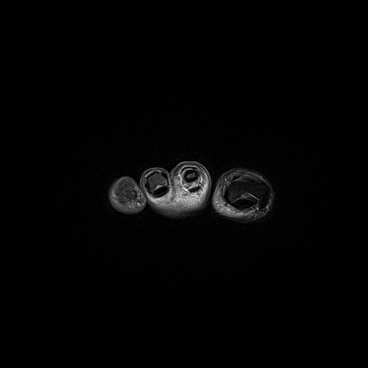
[im 16/36]
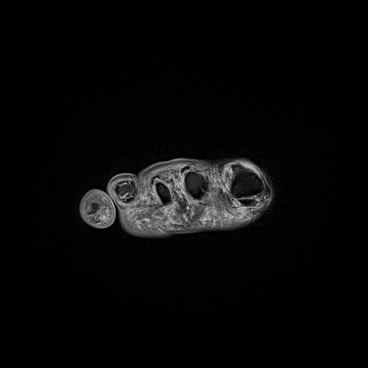
[im 20/36]
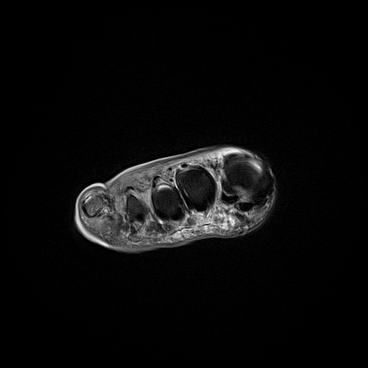
[im 24/36]
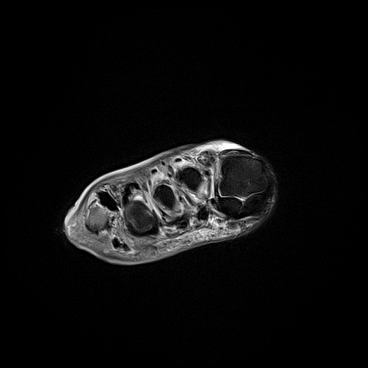
[im 28/36]
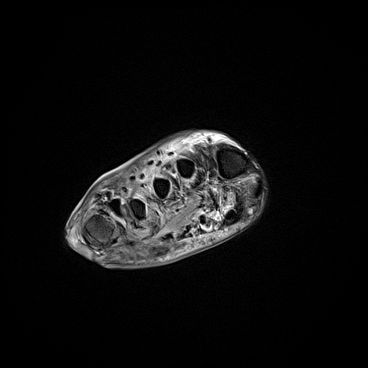
[im 32/36]
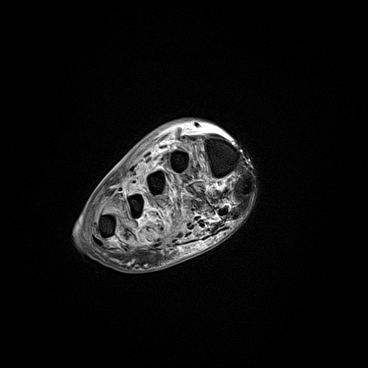
[im 36/36]
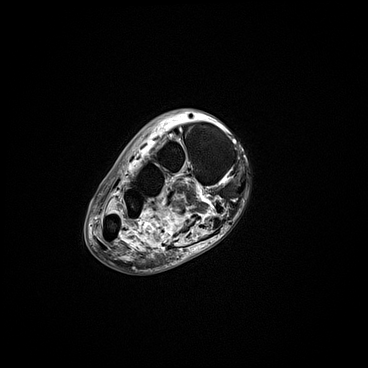

[Series 6: T1 · oblique · right · 3.0mm · 0.52mm/px · 5 of 20 slices shown (2 of 2)]
[im 1/20]
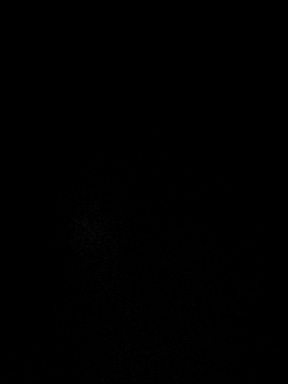
[im 5/20]
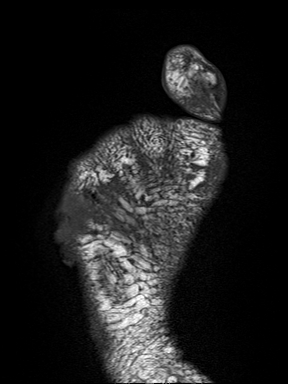
[im 10/20]
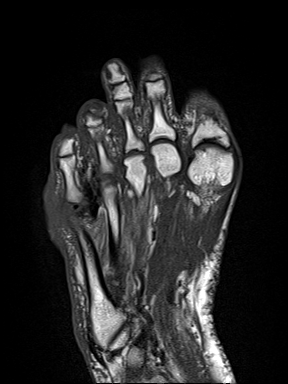
[im 15/20]
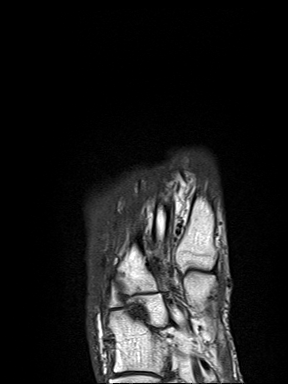
[im 20/20]
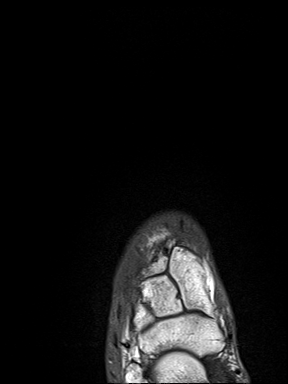

[Series 8: STIR · oblique · right · 3.0mm · 0.70mm/px · 9 of 34 slices shown]
[im 1/34]
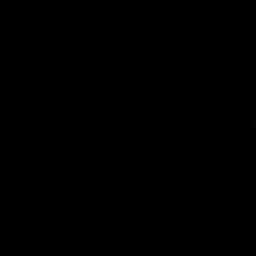
[im 5/34]
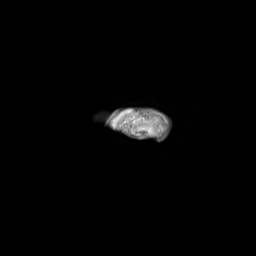
[im 9/34]
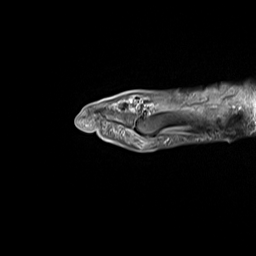
[im 13/34]
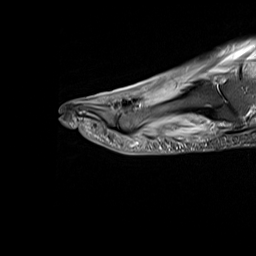
[im 17/34]
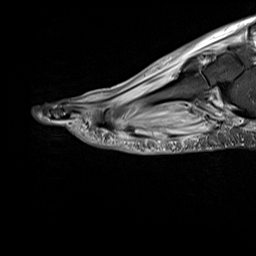
[im 21/34]
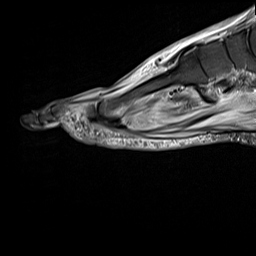
[im 25/34]
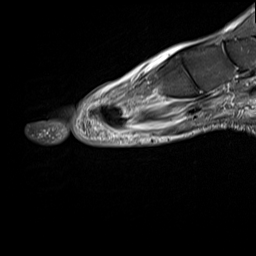
[im 29/34]
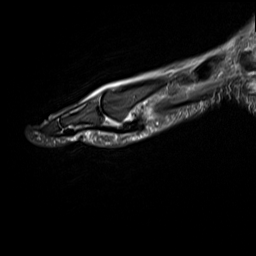
[im 34/34]
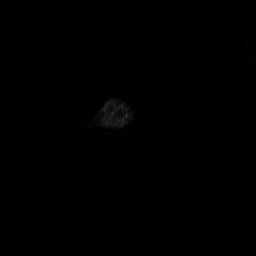

[Series 9: T2 fat-sat · oblique · right · 3.0mm · 0.60mm/px · 5 of 20 slices shown (2 of 2)]
[im 1/20]
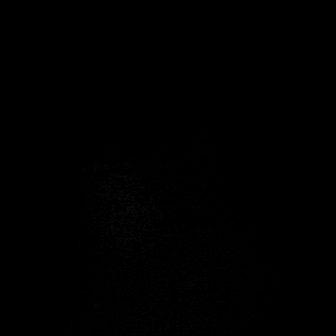
[im 5/20]
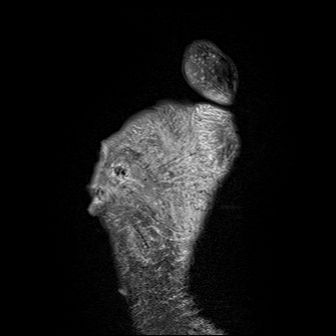
[im 10/20]
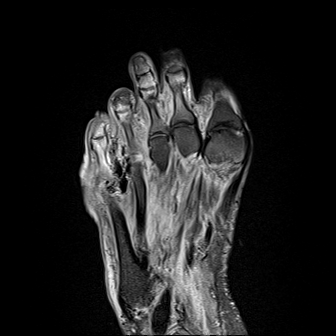
[im 15/20]
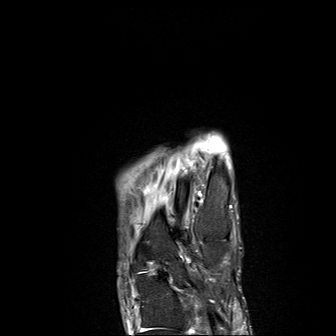
[im 20/20]
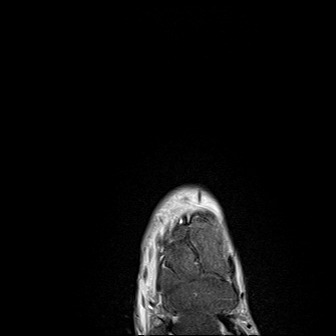

[40 of 40 positions shown; findings below may reference images not displayed]

FINDINGS: Bones/Joint/Cartilage

There is increased T2 signal involving the fifth metatarsal head
with subtle T1 hypointensity seen along the lateral margin best seen
on series 4, image 8. There is also increased T2 signal seen
involving the fifth metatarsal head diffusely and the fifth
phalanges. There is also mildly increased STIR signal seen at the
fourth proximal and distal phalanges. No areas cortical destruction
or bony erosion are seen. No cysts seen at T1 hypointensity are
noted.

Ligaments

The Lisfranc ligaments appear to be intact.

Muscles and Tendons

There is mild fatty atrophy noted within the muscles surrounding the
forefoot. There is diffusely increased signal seen within the
musculature surrounding the forefoot however. There is fluid signal
surrounding the fifth extensor digitorum tendon at the level of the
metatarsal head best seen on series 5, image 8. The remainder of the
flexor extensor tendons are intact.

Soft tissues

Overlying skin ulceration seen along the lateral aspect of the foot
measuring 1.3 cm in length with diffuse subcutaneous edema. There is
subcutaneous emphysema seen surrounding the dorsal aspect of the
fifth digit and extending into the interdigital space.
IMPRESSION: 1. Reactive marrow versus early osteomyelitis involving the lateral
aspect of the fifth metatarsal head. No destructive changes however.
Overlying area of skin ulceration and diffuse edema. No soft tissue
abscess.
2. Reactive marrow involving the fifth and fourth digits as
described above.
3. Fifth extensor digitorum tenosynovitis
4. Subcutaneous emphysema on the dorsal aspect of the fifth digit
and interdigital space.

## 2020-08-23 ENCOUNTER — Encounter (HOSPITAL_BASED_OUTPATIENT_CLINIC_OR_DEPARTMENT_OTHER): Payer: Medicare Other | Admitting: Physician Assistant

## 2020-08-23 ENCOUNTER — Other Ambulatory Visit: Payer: Self-pay

## 2020-08-23 DIAGNOSIS — N3041 Irradiation cystitis with hematuria: Secondary | ICD-10-CM | POA: Diagnosis not present

## 2020-08-23 LAB — GLUCOSE, CAPILLARY
Glucose-Capillary: 167 mg/dL — ABNORMAL HIGH (ref 70–99)
Glucose-Capillary: 168 mg/dL — ABNORMAL HIGH (ref 70–99)

## 2020-08-23 NOTE — Progress Notes (Signed)
[name], [name] ([id number]) Visit Report for 08/18/2020 SuperBill Details Patient Name: Date of Service: [name], [name] 08/18/2020 Medical Record Number: [id number] Patient Account Number: [id number] Date of Birth/Sex: Treating RN: 30-Dec-1950 (70 y.o. 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.41 Irradiation cystitis with hematuria Z85.46 Personal history of malignant neoplasm of prostate Facility Procedures CPT4 Code Description Modifier Quantity 78295621 G0277-(Facility Use Only) HBOT full body chamber, 95min , 4 Physician Procedures Quantity CPT4 Code Description Modifier [id number] 46962 - WC PHYS HYPERBARIC OXYGEN THERAPY 1 ICD-10 Diagnosis Description N30.41 Irradiation cystitis with hematuria Z85.46 Personal history of malignant neoplasm of prostate Electronic Signature(s) Signed: 08/18/2020 5:00:10 PM By: Linton Ham MD Signed: 08/23/2020 6:30:22 PM By: Levan Hurst RN, BSN Entered By: Levan Hurst on 08/18/2020 11:02:31

## 2020-08-23 NOTE — Progress Notes (Signed)
[name], [name] ([id number]) Visit Report for 08/21/2020 HBO Details Patient Name: Date of Service: [name], [name] 08/21/2020 8:00 A M Medical Record Number: [id number] Patient Account Number: [id number] Date of Birth/Sex: Treating RN: [birth year] (70 y.o. [name] Primary Care Erisha Paugh: [name], [place] Other Clinician: Referring Vraj Denardo: Treating Caden Fukushima/Extender: [name], [name] Weeks in Treatment: 2 HBO Treatment Course Details Treatment Course Number: 1 Ordering Florenda Watt: Linton Ham T Treatments Ordered: otal 40 HBO Treatment Start Date: 08/14/2020 HBO Indication: Late Effect of Radiation Notes: Radiation Cystitis HBO Treatment Details Treatment Number: 6 Patient Type: Outpatient Chamber Type: Monoplace Chamber Serial #: [id number] Treatment Protocol: 2.5 ATA with 90 minutes oxygen, with two 5 minute air breaks Treatment Details Compression Rate Down: 2.0 psi / minute De-Compression Rate Up: 2.0 psi / minute A breaks and breathing ir Compress Tx Pressure periods Decompress Decompress Begins Reached (leave unused spaces Begins Ends blank) Chamber Pressure (ATA 1 2.5 2.5 2.5 2.5 2.5 - - 2.5 1 ) Clock Time (24 hr) 08:03 08:15 08:45 08:50 09:20 09:25 - - 09:55 10:07 Treatment Length: 124 (minutes) Treatment Segments: 4 Vital Signs Capillary Blood Glucose Reference Range: 80 - 120 mg / dl HBO Diabetic Blood Glucose Intervention Range: <131 mg/dl or >249 mg/dl Time Vitals Blood Respiratory Capillary Blood Glucose Pulse Action Type: Pulse: Temperature: Taken: Pressure: Rate: Glucose (mg/dl): Meter #: Oximetry (%) Taken: Pre 07:47 108/40 75 16 97.8 181 Post 10:07 116/43 68 18 97.5 145 Treatment Response Treatment Completion Status: Treatment Completed without Adverse Event Lynora Dymond Notes No concerns with treatment given Physician HBO Attestation: I certify that I supervised this HBO treatment in accordance with Medicare guidelines. A trained  emergency response team is readily available per Yes hospital policies and procedures. Continue HBOT as ordered. Yes Electronic Signature(s) Signed: 08/22/2020 4:16:43 PM By: Linton Ham MD Entered By: Linton Ham on 08/21/2020 16:23:45 -------------------------------------------------------------------------------- HBO Safety Checklist Details Patient Name: Date of Service: [name], [name] 08/21/2020 8:00 A M Medical Record Number: [id number] Patient Account Number: [id number] Date of Birth/Sex: Treating RN: [birth year] (70 y.o. [name] Primary Care Teshawn Moan: [name], [place] Other Clinician: Referring Ming Kunka: Treating Xaivier Malay/Extender: [name], [name] Weeks in Treatment: 2 HBO Safety Checklist Items Safety Checklist Consent Form Signed Patient voided / foley secured and emptied When did you last eato 0700 - ham and cheese sandwich Last dose of injectable or oral agent na Ostomy pouch emptied and vented if applicable NA All implantable devices assessed, documented and approved NA Intravenous access site secured and place NA Valuables secured Linens and cotton and cotton/polyester blend (less than 51% polyester) Personal oil-based products / skin lotions / body lotions removed Wigs or hairpieces removed Smoking or tobacco materials removed Books / newspapers / magazines / loose paper removed Cologne, aftershave, perfume and deodorant removed Jewelry removed (may wrap wedding band) Make-up removed Hair care products removed Battery operated devices (external) removed Heating patches and chemical warmers removed Titanium eyewear removed Nail polish cured greater than 10 hours NA Casting material cured greater than 10 hours NA Hearing aids removed NA Loose dentures or partials removed NA Prosthetics have been removed Patient demonstrates correct use of air break device (if applicable) Patient concerns have been addressed Patient  grounding bracelet on and cord attached to chamber Specifics for Inpatients (complete in addition to above) Medication sheet sent with patient NA Intravenous medications needed or due during therapy sent with patient NA Drainage tubes (e.g. nasogastric tube or chest  tube secured and vented) NA Endotracheal or Tracheotomy tube secured NA Cuff deflated of air and inflated with saline NA Airway suctioned NA Electronic Signature(s) Signed: 08/23/2020 6:30:22 PM By: Levan Hurst RN, BSN Entered By: Levan Hurst on 08/21/2020 08:22:30

## 2020-08-23 NOTE — Progress Notes (Signed)
[name], [name] ([id number]) Visit Report for 08/17/2020 SuperBill Details Patient Name: Date of Service: [name], [name] 08/17/2020 Medical Record Number: [id number] Patient Account Number: [id number] Date of Birth/Sex: Treating RN: [birth year] (70 y.o. 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.41 Irradiation cystitis with hematuria Z85.46 Personal history of malignant neoplasm of prostate Facility Procedures CPT4 Code Description Modifier Quantity [id number] G0277-(Facility Use Only) HBOT full body chamber, 81min , 4 Physician Procedures Quantity CPT4 Code Description Modifier [id number] 64332 - WC PHYS HYPERBARIC OXYGEN THERAPY 1 ICD-10 Diagnosis Description N30.41 Irradiation cystitis with hematuria Z85.46 Personal history of malignant neoplasm of prostate Electronic Signature(s) Signed: 08/17/2020 3:44:17 PM By: Linton Ham MD Signed: 08/23/2020 6:30:22 PM By: Levan Hurst RN, BSN Entered By: Levan Hurst on 08/17/2020 14:21:50

## 2020-08-23 NOTE — Progress Notes (Signed)
[name], [name] ([id number]) Visit Report for 08/17/2020 Arrival Information Details Patient Name: Date of Service: Jon, [name] 08/17/2020 8:00 A M Medical Record Number: [id number] Patient Account Number: [id number] Date of Birth/Sex: Treating RN: 1951/03/08 (70 y.o. [name] Primary Care Juandiego Kolenovic: [name], [place] Other Clinician: Referring Micheline Markes: Treating Kahil Agner/Extender: [name], [name] Weeks in Treatment: 1 Visit Information History Since Last Visit Added or deleted any medications: No Patient Arrived: [name] Any new allergies or adverse reactions: No Arrival Time: 07:59 Had a fall or experienced change in No Accompanied By: alone activities of daily living that may affect Transfer Assistance: None risk of falls: Patient Identification Verified: Yes Signs or symptoms of abuse/neglect since last visito No Secondary Verification Process Completed: Yes Hospitalized since last visit: No Patient Requires Transmission-Based Precautions: No Implantable device outside of the clinic excluding No Patient Has Alerts: Yes cellular tissue based products placed in the center Patient Alerts: BP Right Arm Only since last visit: Pain Present Now: No Electronic Signature(s) Signed: 08/23/2020 6:30:22 PM By: Levan Hurst RN, BSN Entered By: Levan Hurst on 08/17/2020 14:19:04 -------------------------------------------------------------------------------- Encounter Discharge Information Details Patient Name: Date of Service: [name], [name] 08/17/2020 8:00 A M Medical Record Number: [id number] Patient Account Number: [id number] Date of Birth/Sex: Treating RN: Apr 22, 1950 (70 y.o. [name] Primary Care Zyquan Crotty: [name], [place] Other Clinician: Referring Cortney Mckinney: Treating Windell Musson/Extender: [name], [name] Weeks in Treatment: 1 Encounter Discharge Information Items Discharge Condition: Stable Ambulatory Status: [name] Discharge  Destination: Home Transportation: Private Auto Accompanied By: alone Schedule Follow-up Appointment: Yes Clinical Summary of Care: Patient Declined Electronic Signature(s) Signed: 08/23/2020 6:30:22 PM By: Levan Hurst RN, BSN Entered By: Levan Hurst on 08/17/2020 14:24:01 -------------------------------------------------------------------------------- [place] Details Patient Name: Date of Service: [name], [place] 08/17/2020 8:00 A M Medical Record Number: [id number] Patient Account Number: [id number] Date of Birth/Sex: Treating RN: 30-Sep-1950 (70 y.o. [name] Primary Care Graciella Arment: [name], [place] Other Clinician: Referring Shourya Macpherson: Treating Makylah Bossard/Extender: [name], [name] Weeks in Treatment: 1 Vital Signs Time Taken: 07:59 Temperature (F): 98.0 Height (in): 70 Pulse (bpm): 75 Weight (lbs): 195 Respiratory Rate (breaths/min): 16 Body Mass Index (BMI): 28 Blood Pressure (mmHg): 92/93 Capillary Blood Glucose (mg/dl): 197 Reference Range: 80 - 120 mg / dl Electronic Signature(s) Signed: 08/23/2020 6:30:22 PM By: Levan Hurst RN, BSN Entered By: Levan Hurst on 08/17/2020 14:19:37

## 2020-08-23 NOTE — Progress Notes (Signed)
BLESSING, OZGA ([id number]) Visit Report for 08/18/2020 HBO Details Patient Name: Date of Service: [name], [name] 08/18/2020 8:00 A M Medical Record Number: [id number] Patient Account Number: [id number] Date of Birth/Sex: Treating RN: 1951-01-18 (70 y.o. [name] Primary Care [name]: [name], [place] Other Clinician: Referring [name]: Treating [name]/Extender: [name], [name] Weeks in Treatment: 1 HBO Treatment Course Details Treatment Course Number: 1 Ordering [name]: [name] T Treatments Ordered: otal 40 HBO Treatment Start Date: 08/14/2020 HBO Indication: Late Effect of Radiation Notes: Radiation Cystitis HBO Treatment Details Treatment Number: 5 Patient Type: Outpatient Chamber Type: Monoplace Chamber Serial #: [id number] Treatment Protocol: 2.5 ATA with 90 minutes oxygen, with two 5 minute air breaks Treatment Details Compression Rate Down: 2.0 psi / minute De-Compression Rate Up: 2.0 psi / minute A breaks and breathing ir Compress Tx Pressure periods Decompress Decompress Begins Reached (leave unused spaces Begins Ends blank) Chamber Pressure (ATA 1 2.5 2.5 2.5 2.5 2.5 - - 2.5 1 ) Clock Time (24 hr) 07:57 08:09 08:39 08:44 09:14 09:19 - - 09:49 10:01 Treatment Length: 124 (minutes) Treatment Segments: 4 Vital Signs Capillary Blood Glucose Reference Range: 80 - 120 mg / dl HBO Diabetic Blood Glucose Intervention Range: <131 mg/dl or >249 mg/dl Time Vitals Blood Respiratory Capillary Blood Glucose Pulse Action Type: Pulse: Temperature: Taken: Pressure: Rate: Glucose (mg/dl): Meter #: Oximetry (%) Taken: Pre 07:44 113/51 72 16 98 202 Post 10:01 130/53 66 16 97.4 129 Treatment Response Treatment Completion Status: Treatment Completed without Adverse Event [name] Notes No concerns with treatment given Physician HBO Attestation: I certify that I supervised this HBO treatment in accordance with Medicare guidelines. A trained  emergency response team is readily available per Yes hospital policies and procedures. Continue HBOT as ordered. Yes Electronic Signature(s) Signed: 08/18/2020 5:00:10 PM By: Jon Ham MD Entered By: [name] on 08/18/2020 15:57:51 -------------------------------------------------------------------------------- HBO Safety Checklist Details Patient Name: Date of Service: [name], [place] 08/18/2020 8:00 A M Medical Record Number: [id number] Patient Account Number: [id number] Date of Birth/Sex: Treating RN: December 26, 1950 (70 y.o. [name] Primary Care [name]: [name], [place] Other Clinician: Referring [name]: Treating [name]/Extender: [name], [name] Weeks in Treatment: 1 HBO Safety Checklist Items Safety Checklist Consent Form Signed Patient voided / foley secured and emptied When did you last eato 0630 - boiled egg, toast Last dose of injectable or oral agent na Ostomy pouch emptied and vented if applicable NA All implantable devices assessed, documented and approved NA Intravenous access site secured and place NA Valuables secured Linens and cotton and cotton/polyester blend (less than 51% polyester) Personal oil-based products / skin lotions / body lotions removed Wigs or hairpieces removed Smoking or tobacco materials removed Books / newspapers / magazines / loose paper removed Cologne, aftershave, perfume and deodorant removed Jewelry removed (may wrap wedding band) Make-up removed Hair care products removed Battery operated devices (external) removed Heating patches and chemical warmers removed Titanium eyewear removed Nail polish cured greater than 10 hours NA Casting material cured greater than 10 hours NA Hearing aids removed NA Loose dentures or partials removed NA Prosthetics have been removed NA Patient demonstrates correct use of air break device (if applicable) Patient concerns have been addressed Patient  grounding bracelet on and cord attached to chamber Specifics for Inpatients (complete in addition to above) Medication sheet sent with patient NA Intravenous medications needed or due during therapy sent with patient NA Drainage tubes (e.g. nasogastric tube or chest  tube secured and vented) NA Endotracheal or Tracheotomy tube secured NA Cuff deflated of air and inflated with saline NA Airway suctioned NA Electronic Signature(s) Signed: 08/23/2020 6:30:22 PM By: Levan Hurst RN, BSN Entered By: Levan Hurst on 08/18/2020 10:59:56

## 2020-08-23 NOTE — Progress Notes (Signed)
[name], CRETELLA ([id number]) Visit Report for 08/21/2020 SuperBill Details Patient Name: Date of Service: MCGWIRE, DASARO 08/21/2020 Medical Record Number: [id number] Patient Account Number: [id number] Date of Birth/Sex: Treating RN: 23-Jul-1950 (70 y.o. [name] Primary Care Provider: [name], [place] Other Clinician: Referring Provider: Treating Provider/Extender: [name], [name] Weeks in Treatment: 2 Diagnosis Coding ICD-10 Codes Code Description N30.41 Irradiation cystitis with hematuria Z85.46 Personal history of malignant neoplasm of prostate Facility Procedures CPT4 Code Description Modifier Quantity [id number] G0277-(Facility Use Only) HBOT full body chamber, 61min , 4 Physician Procedures Quantity CPT4 Code Description Modifier [id number] 59163 - WC PHYS HYPERBARIC OXYGEN THERAPY 1 ICD-10 Diagnosis Description N30.41 Irradiation cystitis with hematuria Z85.46 Personal history of malignant neoplasm of prostate Electronic Signature(s) Signed: 08/22/2020 4:16:43 PM By: Linton Ham MD Signed: 08/23/2020 6:30:22 PM By: Levan Hurst RN, BSN Entered By: Levan Hurst on 08/21/2020 12:56:41

## 2020-08-23 NOTE — Progress Notes (Signed)
MICCO, BOURBEAU ([id number]) Visit Report for 08/17/2020 HBO Details Patient Name: Date of Service: [name], [name] 08/17/2020 8:00 A M Medical Record Number: [id number] Patient Account Number: [id number] Date of Birth/Sex: Treating RN: 09/27/1950 (70 y.o. [name] Primary Care Wrenly Lauritsen: [name], [place] Other Clinician: Referring Kaity Pitstick: Treating Amora Sheehy/Extender: [name], [name] Weeks in Treatment: 1 HBO Treatment Course Details Treatment Course Number: 1 Ordering Temperence Zenor: Linton Ham T Treatments Ordered: otal 40 HBO Treatment Start Date: 08/14/2020 HBO Indication: Late Effect of Radiation Notes: Radiation Cystitis HBO Treatment Details Treatment Number: 4 Patient Type: Outpatient Chamber Type: Monoplace Chamber Serial #: [id number] Treatment Protocol: 2.5 ATA with 90 minutes oxygen, with two 5 minute air breaks Treatment Details Compression Rate Down: 2.0 psi / minute De-Compression Rate Up: 2.0 psi / minute A breaks and breathing ir Compress Tx Pressure periods Decompress Decompress Begins Reached (leave unused spaces Begins Ends blank) Chamber Pressure (ATA 1 2.5 2.5 2.5 2.5 2.5 - - 2.5 1 ) Clock Time (24 hr) 08:09 08:21 08:51 08:56 09:26 09:31 - - 10:01 10:13 Treatment Length: 124 (minutes) Treatment Segments: 4 Vital Signs Capillary Blood Glucose Reference Range: 80 - 120 mg / dl HBO Diabetic Blood Glucose Intervention Range: <131 mg/dl or >249 mg/dl Time Vitals Blood Respiratory Capillary Blood Glucose Pulse Action Type: Pulse: Temperature: Taken: Pressure: Rate: Glucose (mg/dl): Meter #: Oximetry (%) Taken: Pre 07:59 92/93 75 16 98 197 Post 10:13 127/62 68 18 97.5 158 Treatment Response Treatment Completion Status: Treatment Completed without Adverse Event Nahima Ales Notes No concerns with treatment given Physician HBO Attestation: I certify that I supervised this HBO treatment in accordance with Medicare guidelines. A trained  emergency response team is readily available per Yes hospital policies and procedures. Continue HBOT as ordered. Yes Electronic Signature(s) Signed: 08/17/2020 3:44:17 PM By: Linton Ham MD Entered By: Linton Ham on 08/17/2020 15:00:45 -------------------------------------------------------------------------------- HBO Safety Checklist Details Patient Name: Date of Service: [name], [place] 08/17/2020 8:00 A M Medical Record Number: [id number] Patient Account Number: [id number] Date of Birth/Sex: Treating RN: 22-Feb-1951 (70 y.o. [name] Primary Care Ellijah Leffel: [name], [place] Other Clinician: Referring Lailani Tool: Treating Yi Falletta/Extender: [name], [name] Weeks in Treatment: 1 HBO Safety Checklist Items Safety Checklist Consent Form Signed Patient voided / foley secured and emptied When did you last eato 0700 - 2 hard boiled eggs, toast Last dose of injectable or oral agent na Ostomy pouch emptied and vented if applicable NA All implantable devices assessed, documented and approved NA Intravenous access site secured and place NA Valuables secured Linens and cotton and cotton/polyester blend (less than 51% polyester) Personal oil-based products / skin lotions / body lotions removed Wigs or hairpieces removed Smoking or tobacco materials removed Books / newspapers / magazines / loose paper removed Cologne, aftershave, perfume and deodorant removed Jewelry removed (may wrap wedding band) Make-up removed Hair care products removed Battery operated devices (external) removed Heating patches and chemical warmers removed Titanium eyewear removed Nail polish cured greater than 10 hours NA Casting material cured greater than 10 hours NA Hearing aids removed Loose dentures or partials removed Prosthetics have been removed Patient demonstrates correct use of air break device (if applicable) Patient concerns have been addressed NA Patient  grounding bracelet on and cord attached to chamber NA Specifics for Inpatients (complete in addition to above) Medication sheet sent with patient NA Intravenous medications needed or due during therapy sent with patient NA Drainage tubes (e.g. nasogastric tube or  chest tube secured and vented) NA Endotracheal or Tracheotomy tube secured NA Cuff deflated of air and inflated with saline NA Airway suctioned NA Electronic Signature(s) Signed: 08/23/2020 6:30:22 PM By: Levan Hurst RN, BSN Entered By: Levan Hurst on 08/17/2020 14:20:25

## 2020-08-23 NOTE — Progress Notes (Signed)
[name], [name] ([id number]) Visit Report for 08/18/2020 Arrival Information Details Patient Name: Date of Service: [name], [name] 08/18/2020 8:00 A M Medical Record Number: [id number] Patient Account Number: [id number] Date of Birth/Sex: Treating RN: 09-22-1950 (70 y.o. [name] Primary Care Kannan Proia: [name], [place] Other Clinician: Referring Neely Kammerer: Treating Cyrus Ramsburg/Extender: [name], [name] Weeks in Treatment: 1 Visit Information History Since Last Visit Added or deleted any medications: No Patient Arrived: [name] Any new allergies or adverse reactions: No Arrival Time: 07:44 Had a fall or experienced change in No Accompanied By: alone activities of daily living that may affect Transfer Assistance: None risk of falls: Patient Identification Verified: Yes Signs or symptoms of abuse/neglect since last visito No Secondary Verification Process Completed: Yes Hospitalized since last visit: No Patient Requires Transmission-Based Precautions: No Implantable device outside of the clinic excluding No Patient Has Alerts: Yes cellular tissue based products placed in the center Patient Alerts: BP Right Arm Only since last visit: Pain Present Now: No Electronic Signature(s) Signed: 08/23/2020 6:30:22 PM By: Levan Hurst RN, BSN Entered By: Levan Hurst on 08/18/2020 10:58:49 -------------------------------------------------------------------------------- Encounter Discharge Information Details Patient Name: Date of Service: [name], [place] 08/18/2020 8:00 A M Medical Record Number: [id number] Patient Account Number: [id number] Date of Birth/Sex: Treating RN: 10/24/1950 (70 y.o. [name] Primary Care Michella Detjen: [name], [place] Other Clinician: Referring Kristel Durkee: Treating Deeya Richeson/Extender: [name], [name] Weeks in Treatment: 1 Encounter Discharge Information Items Discharge Condition: Stable Ambulatory Status: [name] Discharge  Destination: Home Transportation: Private Auto Accompanied By: alone Schedule Follow-up Appointment: Yes Clinical Summary of Care: Patient Declined Electronic Signature(s) Signed: 08/23/2020 6:30:22 PM By: Levan Hurst RN, BSN Entered By: Levan Hurst on 08/18/2020 11:02:58 -------------------------------------------------------------------------------- [place] Details Patient Name: Date of Service: [name], [place] 08/18/2020 8:00 A M Medical Record Number: [id number] Patient Account Number: [id number] Date of Birth/Sex: Treating RN: [birth year] (70 y.o. [name] Primary Care Jayce Boyko: [name], [place] Other Clinician: Referring Ayaan Ringle: Treating Tait Balistreri/Extender: [name], [name] Weeks in Treatment: 1 Vital Signs Time Taken: 07:44 Temperature (F): 98.0 Height (in): 70 Pulse (bpm): 72 Weight (lbs): 195 Respiratory Rate (breaths/min): 16 Body Mass Index (BMI): 28 Blood Pressure (mmHg): 113/51 Capillary Blood Glucose (mg/dl): 202 Reference Range: 80 - 120 mg / dl Electronic Signature(s) Signed: 08/23/2020 6:30:22 PM By: Levan Hurst RN, BSN Entered By: Levan Hurst on 08/18/2020 10:59:08

## 2020-08-23 NOTE — Progress Notes (Signed)
PRINSTON, KYNARD ([id number]) Visit Report for 08/22/2020 Arrival Information Details Patient Name: Date of Service: [name], [name] 08/22/2020 8:00 A M Medical Record Number: [id number] Patient Account Number: [id number] Date of Birth/Sex: Treating RN: [birth year] (70 y.o. [name] Primary Care [name]: [name], [place] Other Clinician: Referring [name]: Treating [name]/Extender: [name], [name] Weeks in Treatment: 2 Visit Information History Since Last Visit Added or deleted any medications: No Patient Arrived: [name] Any new allergies or adverse reactions: No Arrival Time: 07:47 Had a fall or experienced change in No Accompanied By: alone activities of daily living that may affect Transfer Assistance: None risk of falls: Patient Identification Verified: Yes Signs or symptoms of abuse/neglect since last visito No Secondary Verification Process Completed: Yes Hospitalized since last visit: No Patient Requires Transmission-Based Precautions: No Implantable device outside of the clinic excluding No Patient Has Alerts: Yes cellular tissue based products placed in the center Patient Alerts: BP Right Arm Only since last visit: Pain Present Now: No Electronic Signature(s) Signed: 08/23/2020 6:30:22 PM By: Jon Hurst RN, BSN Entered By: [name] on 08/22/2020 09:55:48 -------------------------------------------------------------------------------- Encounter Discharge Information Details Patient Name: Date of Service: [name], [name] 08/22/2020 8:00 [name] Record Number: [id number] Patient Account Number: [id number] Date of Birth/Sex: Treating RN: October 14, 1950 (70 y.o. [name] Primary Care [name]: [name], [place] Other Clinician: Referring Tavyn Kurka: Treating [name]/Extender: [name], [name] Weeks in Treatment: 2 Encounter Discharge Information Items Discharge Condition: Stable Ambulatory Status: [name] Discharge  Destination: Home Transportation: Private Auto Accompanied By: alone Schedule Follow-up Appointment: Yes Clinical Summary of Care: Patient Declined Electronic Signature(s) Signed: 08/23/2020 6:30:22 PM By: Jon Hurst RN, BSN Entered By: [name] on 08/22/2020 10:43:20 -------------------------------------------------------------------------------- Vitals Details Patient Name: Date of Service: [name], [name] 08/22/2020 8:00 A M Medical Record Number: [id number] Patient Account Number: [id number] Date of Birth/Sex: Treating RN: 08-25-1950 (70 y.o. [name] Primary Care Christobal Morado: [name], [place] Other Clinician: Referring [name]: Treating [name]/Extender: [name], [name] Weeks in Treatment: 2 Vital Signs Time Taken: 07:47 Temperature (F): 98.3 Height (in): 70 Pulse (bpm): 72 Weight (lbs): 195 Respiratory Rate (breaths/min): 16 Body Mass Index (BMI): 28 Blood Pressure (mmHg): 95/45 Capillary Blood Glucose (mg/dl): 153 Reference Range: 80 - 120 mg / dl Electronic Signature(s) Signed: 08/23/2020 6:30:22 PM By: Jon Hurst RN, BSN Entered By: [name] on 08/22/2020 09:56:06

## 2020-08-23 NOTE — Progress Notes (Signed)
HOMAR, WEINKAUF ([id number]) Visit Report for 08/22/2020 SuperBill Details Patient Name: Date of Service: [name], [name] 08/22/2020 Medical Record Number: [id number] Patient Account Number: [id number] Date of Birth/Sex: Treating RN: Aug 17, 1950 (70 y.o. [name] Primary Care Provider: [name], [place] Other Clinician: Referring Provider: Treating Provider/Extender: [name], [name] Weeks in Treatment: 2 Diagnosis Coding ICD-10 Codes Code Description N30.41 Irradiation cystitis with hematuria Z85.46 Personal history of malignant neoplasm of prostate Facility Procedures CPT4 Code Description Modifier Quantity [id number] G0277-(Facility Use Only) HBOT full body chamber, 39min , 4 Physician Procedures Quantity CPT4 Code Description Modifier [id number] 82883 - WC PHYS HYPERBARIC OXYGEN THERAPY 1 ICD-10 Diagnosis Description N30.41 Irradiation cystitis with hematuria Z85.46 Personal history of malignant neoplasm of prostate Electronic Signature(s) Signed: 08/22/2020 4:16:43 PM By: Linton Ham MD Signed: 08/23/2020 6:30:22 PM By: Levan Hurst RN, BSN Entered By: Levan Hurst on 08/22/2020 10:42:55

## 2020-08-23 NOTE — Progress Notes (Signed)
[name], [name] ([id number]) Visit Report for 08/21/2020 Arrival Information Details Patient Name: Date of Service: [name], [name] 08/21/2020 8:00 A M Medical Record Number: [id number] Patient Account Number: [id number] Date of Birth/Sex: Treating RN: 1951-02-24 (70 y.o. [name] Primary Care Kamica Florance: [name], [place] Other Clinician: Referring Charlet Harr: Treating Blanca Carreon/Extender: [name], [name] Weeks in Treatment: 2 Visit Information History Since Last Visit Added or deleted any medications: No Patient Arrived: [name] Any new allergies or adverse reactions: No Arrival Time: 07:47 Had a fall or experienced change in No Accompanied By: alone activities of daily living that may affect Transfer Assistance: None risk of falls: Patient Identification Verified: Yes Signs or symptoms of abuse/neglect since last visito No Secondary Verification Process Completed: Yes Hospitalized since last visit: No Patient Requires Transmission-Based Precautions: No Implantable device outside of the clinic excluding No Patient Has Alerts: Yes cellular tissue based products placed in the center Patient Alerts: BP Right Arm Only since last visit: Pain Present Now: No Electronic Signature(s) Signed: 08/23/2020 6:30:22 PM By: Levan Hurst RN, BSN Entered By: Levan Hurst on 08/21/2020 08:21:09 -------------------------------------------------------------------------------- Encounter Discharge Information Details Patient Name: Date of Service: [name], [name] 08/21/2020 8:00 A M Medical Record Number: [id number] Patient Account Number: [id number] Date of Birth/Sex: Treating RN: [birth year] (70 y.o. [name] Primary Care Naryiah Schley: [name], [place] Other Clinician: Referring Quaid Yeakle: Treating Carnita Golob/Extender: [name], [name] Weeks in Treatment: 2 Encounter Discharge Information Items Discharge Condition: Stable Ambulatory Status: [name] Discharge  Destination: Home Transportation: Private Auto Accompanied By: alone Schedule Follow-up Appointment: Yes Clinical Summary of Care: Patient Declined Electronic Signature(s) Signed: 08/23/2020 6:30:22 PM By: Levan Hurst RN, BSN Entered By: Levan Hurst on 08/21/2020 12:57:03 -------------------------------------------------------------------------------- Vitals Details Patient Name: Date of Service: [name], [name] 08/21/2020 8:00 A M Medical Record Number: [id number] Patient Account Number: [id number] Date of Birth/Sex: Treating RN: 22-Jul-1950 (70 y.o. [name] Primary Care Mailin Coglianese: [name], [place] Other Clinician: Referring Willman Cuny: Treating Audrianna Driskill/Extender: [name], [name] Weeks in Treatment: 2 Vital Signs Time Taken: 07:47 Temperature (F): 97.8 Height (in): 70 Pulse (bpm): 75 Weight (lbs): 195 Respiratory Rate (breaths/min): 16 Body Mass Index (BMI): 28 Blood Pressure (mmHg): 108/40 Capillary Blood Glucose (mg/dl): 181 Reference Range: 80 - 120 mg / dl Electronic Signature(s) Signed: 08/23/2020 6:30:22 PM By: Levan Hurst RN, BSN Entered By: Levan Hurst on 08/21/2020 08:21:34

## 2020-08-23 NOTE — Progress Notes (Signed)
[name], [name] ([id number]) Visit Report for 08/22/2020 HBO Details Patient Name: Date of Service: [name], [name] 08/22/2020 8:00 A M Medical Record Number: [id number] Patient Account Number: [id number] Date of Birth/Sex: Treating RN: May 05, 1950 (70 y.o. [name] Primary Care Charlynn Salih: [name], [place] Other Clinician: Referring Carling Liberman: Treating Farouk Vivero/Extender: [name], [name] Weeks in Treatment: 2 HBO Treatment Course Details Treatment Course Number: 1 Ordering Jeiden Daughtridge: Linton Ham T Treatments Ordered: otal 40 HBO Treatment Start Date: 08/14/2020 HBO Indication: Late Effect of Radiation Notes: Radiation Cystitis HBO Treatment Details Treatment Number: 7 Patient Type: Outpatient Chamber Type: Monoplace Chamber Serial #: [id number] Treatment Protocol: 2.5 ATA with 90 minutes oxygen, with two 5 minute air breaks Treatment Details Compression Rate Down: 2.0 psi / minute De-Compression Rate Up: 2.5 psi / minute A breaks and breathing ir Compress Tx Pressure periods Decompress Decompress Begins Reached (leave unused spaces Begins Ends blank) Chamber Pressure (ATA 1 2.5 2.5 2.5 2.5 2.5 - - 2.5 1 ) Clock Time (24 hr) 08:01 08:13 08:43 08:48 09:18 09:23 - - 09:53 10:03 Treatment Length: 122 (minutes) Treatment Segments: 4 Vital Signs Capillary Blood Glucose Reference Range: 80 - 120 mg / dl HBO Diabetic Blood Glucose Intervention Range: <131 mg/dl or >249 mg/dl Time Vitals Blood Respiratory Capillary Blood Glucose Pulse Action Type: Pulse: Temperature: Taken: Pressure: Rate: Glucose (mg/dl): Meter #: Oximetry (%) Taken: Pre 07:47 95/45 72 16 98.3 153 Post 10:03 125/42 71 18 97.6 170 Treatment Response Treatment Completion Status: Treatment Completed without Adverse Event Jordan Caraveo Notes No concerns with treatment given Physician HBO Attestation: I certify that I supervised this HBO treatment in accordance with Medicare guidelines. A trained  emergency response team is readily available per Yes hospital policies and procedures. Continue HBOT as ordered. Yes Electronic Signature(s) Signed: 08/22/2020 4:16:43 PM By: Linton Ham MD Entered By: Linton Ham on 08/22/2020 16:15:02 -------------------------------------------------------------------------------- HBO Safety Checklist Details Patient Name: Date of Service: [name], [name] 08/22/2020 8:00 A M Medical Record Number: [id number] Patient Account Number: [id number] Date of Birth/Sex: Treating RN: 1950/06/11 (70 y.o. [name] Primary Care Kindrick Lankford: [name], [place] Other Clinician: Referring Davelyn Gwinn: Treating Adabella Stanis/Extender: [name], [name] Weeks in Treatment: 2 HBO Safety Checklist Items Safety Checklist Consent Form Signed Patient voided / foley secured and emptied When did you last eato 0700 - boiled egg, toast Last dose of injectable or oral agent na Ostomy pouch emptied and vented if applicable NA All implantable devices assessed, documented and approved NA Intravenous access site secured and place NA Valuables secured Linens and cotton and cotton/polyester blend (less than 51% polyester) Personal oil-based products / skin lotions / body lotions removed Wigs or hairpieces removed Smoking or tobacco materials removed Books / newspapers / magazines / loose paper removed Cologne, aftershave, perfume and deodorant removed Jewelry removed (may wrap wedding band) Make-up removed Hair care products removed Battery operated devices (external) removed Heating patches and chemical warmers removed Titanium eyewear removed Nail polish cured greater than 10 hours NA Casting material cured greater than 10 hours NA Hearing aids removed NA Loose dentures or partials removed NA Prosthetics have been removed Patient demonstrates correct use of air break device (if applicable) Patient concerns have been addressed Patient grounding  bracelet on and cord attached to chamber Specifics for Inpatients (complete in addition to above) Medication sheet sent with patient NA Intravenous medications needed or due during therapy sent with patient NA Drainage tubes (e.g. nasogastric tube or chest tube  secured and vented) NA Endotracheal or Tracheotomy tube secured NA Cuff deflated of air and inflated with saline NA Airway suctioned NA Electronic Signature(s) Signed: 08/23/2020 6:30:22 PM By: Levan Hurst RN, BSN Entered By: Levan Hurst on 08/22/2020 09:56:48

## 2020-08-24 ENCOUNTER — Encounter (HOSPITAL_BASED_OUTPATIENT_CLINIC_OR_DEPARTMENT_OTHER): Payer: Medicare Other | Admitting: Internal Medicine

## 2020-08-24 ENCOUNTER — Other Ambulatory Visit: Payer: Self-pay

## 2020-08-24 DIAGNOSIS — N3041 Irradiation cystitis with hematuria: Secondary | ICD-10-CM | POA: Diagnosis not present

## 2020-08-24 LAB — GLUCOSE, CAPILLARY
Glucose-Capillary: 188 mg/dL — ABNORMAL HIGH (ref 70–99)
Glucose-Capillary: 126 mg/dL — ABNORMAL HIGH (ref 70–99)

## 2020-08-24 NOTE — Progress Notes (Signed)
MAXAMUS, COLAO ([id number]) Visit Report for 08/23/2020 HBO Details Patient Name: Date of Service: [name], [name] 08/23/2020 8:00 A M Medical Record Number: [id number] Patient Account Number: [id number] Date of Birth/Sex: Treating RN: 11-12-1950 (70 y.o. [name] Primary Care [name]: [name], [place] TA LIE Other Clinician: Referring [name]: Treating [name]/Extender: Doyle Askew, [name] Weeks in Treatment: 2 HBO Treatment Course Details Treatment Course Number: 1 Ordering Nickayla Mcinnis: Bernerd Pho Treatments Ordered: otal 40 HBO Treatment Start Date: 08/14/2020 HBO Indication: Late Effect of Radiation Notes: Radiation Cystitis HBO Treatment Details Treatment Number: 8 Patient Type: Outpatient Chamber Type: Monoplace Chamber Serial #: [id number] Treatment Protocol: 2.5 ATA with 90 minutes oxygen, with two 5 minute air breaks Treatment Details Compression Rate Down: 2.0 psi / minute De-Compression Rate Up: 2.0 psi / minute A breaks and breathing ir Compress Tx Pressure periods Decompress Decompress Begins Reached (leave unused spaces Begins Ends blank) Chamber Pressure (ATA 1 2.5 2.5 2.5 2.5 2.5 - - 2.5 1 ) Clock Time (24 hr) 08:09 08:21 08:51 08:56 09:26 09:31 - - 10:01 10:11 Treatment Length: 122 (minutes) Treatment Segments: 4 Vital Signs Capillary Blood Glucose Reference Range: 80 - 120 mg / dl HBO Diabetic Blood Glucose Intervention Range: <131 mg/dl or >249 mg/dl Time Vitals Blood Respiratory Capillary Blood Glucose Pulse Action Type: Pulse: Temperature: Taken: Pressure: Rate: Glucose (mg/dl): Meter #: Oximetry (%) Taken: Pre 07:40 104/45 79 18 98.6 167 Post 10:13 123/49 71 16 97.8 168 Treatment Response Treatment Toleration: Well Treatment Completion Status: Treatment Completed without Adverse Event Electronic Signature(s) Signed: 08/23/2020 1:22:22 PM By: Worthy Keeler PA-C Signed: 08/24/2020 6:00:49 PM By: Deon Pilling Entered By: Deon Pilling  on 08/23/2020 10:47:36 -------------------------------------------------------------------------------- HBO Safety Checklist Details Patient Name: Date of Service: [name], [name] 08/23/2020 8:00 A M Medical Record Number: [id number] Patient Account Number: [id number] Date of Birth/Sex: Treating RN: [birth year] (70 y.o. [name] Primary Care [name]: [name], [place] TA LIE Other Clinician: Referring [name]: Treating [name]/Extender: [name], [name] Weeks in Treatment: 2 HBO Safety Checklist Items Safety Checklist Consent Form Signed Patient voided / foley secured and emptied When did you last eato this morning 2 boiled eggs, toast, coffee Last dose of injectable or oral agent last week; weekly Ostomy pouch emptied and vented if applicable NA All implantable devices assessed, documented and approved NA Intravenous access site secured and place NA Valuables secured Linens and cotton and cotton/polyester blend (less than 51% polyester) Personal oil-based products / skin lotions / body lotions removed Wigs or hairpieces removed NA Smoking or tobacco materials removed NA Books / newspapers / magazines / loose paper removed NA Cologne, aftershave, perfume and deodorant removed Jewelry removed (may wrap wedding band) Make-up removed NA Hair care products removed NA Battery operated devices (external) removed NA Heating patches and chemical warmers removed NA Titanium eyewear removed Nail polish cured greater than 10 hours NA Casting material cured greater than 10 hours NA Hearing aids removed NA Loose dentures or partials removed NA Prosthetics have been removed NA Patient demonstrates correct use of air break device (if applicable) Patient concerns have been addressed Patient grounding bracelet on and cord attached to chamber Specifics for Inpatients (complete in addition to above) Medication sheet sent with patient Intravenous medications needed or due during  therapy sent with patient Drainage tubes (e.g. nasogastric tube or chest tube secured and vented) Endotracheal or Tracheotomy tube secured Cuff deflated of air and inflated with saline Airway suctioned Electronic Signature(s) Signed: 08/24/2020  6:00:49 PM By: Deon Pilling Entered By: Deon Pilling on 08/23/2020 10:46:37

## 2020-08-24 NOTE — Progress Notes (Signed)
[name], [name] ([id number]) Visit Report for 08/23/2020 Arrival Information Details Patient Name: Date of Service: Jon, [name] 08/23/2020 8:00 A M Medical Record Number: [id number] Patient Account Number: [id number] Date of Birth/Sex: Treating RN: 11/22/1950 (70 y.o. [name], [name] Primary Care [name]: [name], [name] Other Clinician: Referring [name]: Treating [name]/Extender: [name], NA TA [name] Weeks in Treatment: 2 Visit Information History Since Last Visit Added or deleted any medications: No Patient Arrived: Walker Any new allergies or adverse reactions: No Arrival Time: 07:40 Had a fall or experienced change in No Accompanied By: self activities of daily living that may affect Transfer Assistance: None risk of falls: Patient Identification Verified: Yes Signs or symptoms of abuse/neglect since last visito No Secondary Verification Process Completed: Yes Hospitalized since last visit: No Patient Requires Transmission-Based Precautions: No Implantable device outside of the clinic excluding No Patient Has Alerts: Yes cellular tissue based products placed in the center Patient Alerts: BP Right Arm Only since last visit: Pain Present Now: No Electronic Signature(s) Signed: 08/24/2020 6:00:49 PM By: [name] Entered By: [name] on 08/23/2020 10:45:28 -------------------------------------------------------------------------------- Encounter Discharge Information Details Patient Name: Date of Service: [name], [name] 08/23/2020 8:00 A M Medical Record Number: [id number] Patient Account Number: [id number] Date of Birth/Sex: Treating RN: 08-29-1950 (70 y.o. [name] Primary Care [name] Other Clinician: Referring [name]: Treating [name]/Extender: [name], NA TA [name] Weeks in Treatment: 2 Encounter Discharge Information Items Discharge Condition: Stable Ambulatory Status: Walker Discharge Destination: Home Transportation: Private  Auto Accompanied By: self Schedule Follow-up Appointment: Yes Clinical Summary of Care: Electronic Signature(s) Signed: 08/24/2020 6:00:49 PM By: [name] Entered By: [name] on 08/23/2020 10:47:50 -------------------------------------------------------------------------------- Vitals Details Patient Name: Date of Service: [name], [name] 08/23/2020 8:00 A M Medical Record Number: [id number] Patient Account Number: [id number] Date of Birth/Sex: Treating RN: 1950/08/11 (70 y.o. [name] Primary Care [name]: [name], [name] Other Clinician: Referring [name]: Treating [name]/Extender: [name], NA TA [name] Weeks in Treatment: 2 Vital Signs Time Taken: 07:40 Temperature (F): 98.6 Height (in): 70 Pulse (bpm): 79 Weight (lbs): 195 Respiratory Rate (breaths/min): 18 Body Mass Index (BMI): 28 Blood Pressure (mmHg): 104/45 Capillary Blood Glucose (mg/dl): 167 Reference Range: 80 - 120 mg / dl Electronic Signature(s) Signed: 08/24/2020 6:00:49 PM By: [name] Entered By: [name] on 08/23/2020 10:45:44

## 2020-08-24 NOTE — Progress Notes (Signed)
[name], [name] ([id number]) Visit Report for 08/23/2020 SuperBill Details Patient Name: Date of Service: [name], [name] 08/23/2020 Medical Record Number: [id number] Patient Account Number: [id number] Date of Birth/Sex: Treating RN: 12/15/1950 (69 y.o. [name] Primary Care Provider: [name], [place] Other Clinician: Referring Provider: Treating Provider/Extender: Doyle Askew, [name] Weeks in Treatment: 2 Diagnosis Coding ICD-10 Codes Code Description N30.41 Irradiation cystitis with hematuria Z85.46 Personal history of malignant neoplasm of prostate Facility Procedures CPT4 Code Description Modifier Quantity [id number] G0277-(Facility Use Only) HBOT full body chamber, 34min , 4 Physician Procedures Quantity CPT4 Code Description Modifier [id number] 27129 - WC PHYS HYPERBARIC OXYGEN THERAPY 1 ICD-10 Diagnosis Description N30.41 Irradiation cystitis with hematuria Z85.46 Personal history of malignant neoplasm of prostate Electronic Signature(s) Signed: 08/24/2020 3:51:27 PM By: Worthy Keeler PA-C Signed: 08/24/2020 6:00:49 PM By: Deon Pilling Entered By: Deon Pilling on 08/23/2020 17:00:27

## 2020-08-25 ENCOUNTER — Other Ambulatory Visit: Payer: Self-pay

## 2020-08-25 ENCOUNTER — Encounter (HOSPITAL_BASED_OUTPATIENT_CLINIC_OR_DEPARTMENT_OTHER): Payer: Medicare Other | Admitting: Internal Medicine

## 2020-08-25 DIAGNOSIS — N3041 Irradiation cystitis with hematuria: Secondary | ICD-10-CM | POA: Diagnosis not present

## 2020-08-25 LAB — GLUCOSE, CAPILLARY
Glucose-Capillary: 180 mg/dL — ABNORMAL HIGH (ref 70–99)
Glucose-Capillary: 175 mg/dL — ABNORMAL HIGH (ref 70–99)

## 2020-08-28 ENCOUNTER — Other Ambulatory Visit: Payer: Self-pay

## 2020-08-28 ENCOUNTER — Encounter (HOSPITAL_BASED_OUTPATIENT_CLINIC_OR_DEPARTMENT_OTHER): Payer: Medicare Other | Admitting: Internal Medicine

## 2020-08-28 DIAGNOSIS — N3041 Irradiation cystitis with hematuria: Secondary | ICD-10-CM

## 2020-08-28 DIAGNOSIS — Z8546 Personal history of malignant neoplasm of prostate: Secondary | ICD-10-CM

## 2020-08-28 LAB — GLUCOSE, CAPILLARY
Glucose-Capillary: 137 mg/dL — ABNORMAL HIGH (ref 70–99)
Glucose-Capillary: 194 mg/dL — ABNORMAL HIGH (ref 70–99)

## 2020-08-28 NOTE — Progress Notes (Signed)
Jon, [name] ([id number]) Visit Report for 08/24/2020 HBO Details Patient Name: Date of Service: [name], [name] 08/24/2020 8:00 A M Medical Record Number: [id number] Patient Account Number: [id number] Date of Birth/Sex: Treating RN: 21-Aug-1950 (70 y.o. 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 HBO Treatment Details Treatment Number: 9 Patient Type: Outpatient Chamber Type: Monoplace Chamber Serial #: [id number] Treatment Protocol: 2.5 ATA with 90 minutes oxygen, with two 5 minute air breaks Treatment Details Compression Rate Down: 2.0 psi / minute De-Compression Rate Up: 2.5 psi / minute A breaks and breathing ir Compress Tx Pressure periods Decompress Decompress Begins Reached (leave unused spaces Begins Ends blank) Chamber Pressure (ATA 1 2.5 2.5 2.5 2.5 2.5 - - 2.5 1 ) Clock Time (24 hr) 08:01 08:13 08:43 08:48 09:18 09:23 - - 09:53 10:03 Treatment Length: 122 (minutes) Treatment Segments: 4 Vital Signs Capillary Blood Glucose Reference Range: 80 - 120 mg / dl HBO Diabetic Blood Glucose Intervention Range: <131 mg/dl or >249 mg/dl Time Vitals Blood Respiratory Capillary Blood Glucose Pulse Action Type: Pulse: Temperature: Taken: Pressure: Rate: Glucose (mg/dl): Meter #: Oximetry (%) Taken: Pre 07:44 116/43 67 18 98.7 188 Post 10:03 120/49 60 18 97.8 126 Treatment Response Treatment Completion Status: Treatment Completed without Adverse Event Dhruva Orndoff Notes No concerns with treatment given Physician HBO Attestation: I certify that I supervised this HBO treatment in accordance with Medicare guidelines. A trained  emergency response team is readily available per Yes hospital policies and procedures. Continue HBOT as ordered. Yes Electronic Signature(s) Signed: 08/24/2020 4:52:14 PM By: Linton Ham MD Entered By: Linton Ham on 08/24/2020 16:46:33 -------------------------------------------------------------------------------- HBO Safety Checklist Details Patient Name: Date of Service: [name], [name] 08/24/2020 8:00 A M Medical Record Number: [id number] Patient Account Number: [id number] Date of Birth/Sex: Treating RN: [birth year] (70 y.o. [name] Primary Care Dominick Zertuche: [name], [place] Other Clinician: Referring Paulmichael Schreck: Treating Talib Headley/Extender: [name], [name] Weeks in Treatment: 2 HBO Safety Checklist Items Safety Checklist Consent Form Signed Patient voided / foley secured and emptied When did you last eato 0700 - ham and cheese sandwich Last dose of injectable or oral agent na Ostomy pouch emptied and vented if applicable NA All implantable devices assessed, documented and approved NA Intravenous access site secured and place NA Valuables secured Linens and cotton and cotton/polyester blend (less than 51% polyester) Personal oil-based products / skin lotions / body lotions removed Wigs or hairpieces removed Smoking or tobacco materials removed Books / newspapers / magazines / loose paper removed Cologne, aftershave, perfume and deodorant removed Jewelry removed (may wrap wedding band) Make-up removed NA Hair care products removed Battery operated devices (external) removed Heating patches and chemical warmers removed Titanium eyewear removed Nail polish cured greater than 10 hours NA Casting material cured greater than 10 hours NA Hearing aids removed Loose dentures or partials removed NA Prosthetics have been removed Patient demonstrates correct use of air break device (if applicable) Patient concerns have been addressed Patient  grounding bracelet on and cord attached to chamber Specifics for Inpatients (complete in addition to above) Medication sheet sent with patient NA Intravenous medications needed or due during therapy sent with patient NA Drainage tubes (e.g. nasogastric tube or chest  tube secured and vented) NA Endotracheal or Tracheotomy tube secured NA Cuff deflated of air and inflated with saline NA Airway suctioned NA Electronic Signature(s) Signed: 08/28/2020 5:27:53 PM By: Levan Hurst RN, BSN Entered By: Levan Hurst on 08/24/2020 08:36:15

## 2020-08-28 NOTE — Progress Notes (Signed)
[name] ([id number]) Visit Report for 08/25/2020 HBO Details Patient Name: Date of Service: [name], [name] 08/25/2020 8:00 A M Medical Record Number: [id number] Patient Account Number: [id number] Date of Birth/Sex: Treating RN: [birth year] (70 y.o. [name] Primary Care [name]: [name], [place] Other Clinician: Referring [name]: Treating [name]/Extender: [name], [name] Weeks in Treatment: 2 HBO Treatment Course Details Treatment Course Number: 1 Ordering [name]: [name] T Treatments Ordered: otal 40 HBO Treatment Start Date: 08/14/2020 HBO Indication: Late Effect of Radiation Notes: Radiation Cystitis HBO Treatment Details Treatment Number: 10 Patient Type: Outpatient Chamber Type: Monoplace Chamber Serial #: [id number] Treatment Protocol: 2.5 ATA with 90 minutes oxygen, with two 5 minute air breaks Treatment Details Compression Rate Down: 2.0 psi / minute De-Compression Rate Up: 2.5 psi / minute A breaks and breathing ir Compress Tx Pressure periods Decompress Decompress Begins Reached (leave unused spaces Begins Ends blank) Chamber Pressure (ATA 1 2.5 2.5 2.5 2.5 2.5 - - 2.5 1 ) Clock Time (24 hr) 07:57 08:09 08:39 08:44 09:14 09:19 - - 09:49 09:59 Treatment Length: 122 (minutes) Treatment Segments: 4 Vital Signs Capillary Blood Glucose Reference Range: 80 - 120 mg / dl HBO Diabetic Blood Glucose Intervention Range: <131 mg/dl or >249 mg/dl Time Vitals Blood Respiratory Capillary Blood Glucose Pulse Action Type: Pulse: Temperature: Taken: Pressure: Rate: Glucose (mg/dl): Meter #: Oximetry (%) Taken: Pre 07:42 105/43 75 18 98.8 180 Post 09:59 98.1 70 16 98.1 175 Treatment Response Treatment Completion Status: Treatment Completed without Adverse Event [name] Notes No concerns with treatment given Physician HBO Attestation: I certify that I supervised this HBO treatment in accordance with Medicare guidelines. A trained  emergency response team is readily available per Yes hospital policies and procedures. Continue HBOT as ordered. Yes Electronic Signature(s) Signed: 08/25/2020 4:33:12 PM By: Jon Ham MD Entered By: [name] on 08/25/2020 12:31:41 -------------------------------------------------------------------------------- HBO Safety Checklist Details Patient Name: Date of Service: [name], [name] 08/25/2020 8:00 A M Medical Record Number: [id number] Patient Account Number: [id number] Date of Birth/Sex: Treating RN: 1950/12/15 (70 y.o. [name] Primary Care [name]: [name], [place] Other Clinician: Referring [name]: Treating [name]/Extender: [name], [name] Weeks in Treatment: 2 HBO Safety Checklist Items Safety Checklist Consent Form Signed Patient voided / foley secured and emptied When did you last eato 0645- [name] and cheese sandwich Last dose of injectable or oral agent na Ostomy pouch emptied and vented if applicable NA All implantable devices assessed, documented and approved NA Intravenous access site secured and place NA Valuables secured Linens and cotton and cotton/polyester blend (less than 51% polyester) Personal oil-based products / skin lotions / body lotions removed Wigs or hairpieces removed Smoking or tobacco materials removed Books / newspapers / magazines / loose paper removed Cologne, aftershave, perfume and deodorant removed Jewelry removed (may wrap wedding band) Make-up removed Hair care products removed Battery operated devices (external) removed Heating patches and chemical warmers removed Titanium eyewear removed Nail polish cured greater than 10 hours NA Casting material cured greater than 10 hours NA Hearing aids removed NA Loose dentures or partials removed NA Prosthetics have been removed Patient demonstrates correct use of air break device (if applicable) NA Patient concerns have been  addressed NA Patient grounding bracelet on and cord attached to chamber NA Specifics for Inpatients (complete in addition to above) Medication sheet sent with patient NA Intravenous medications needed or due during therapy sent with patient NA Drainage tubes (e.g. nasogastric tube  or chest tube secured and vented) NA Endotracheal or Tracheotomy tube secured NA Cuff deflated of air and inflated with saline NA Airway suctioned NA Electronic Signature(s) Signed: 08/28/2020 5:27:53 PM By: Levan Hurst RN, BSN Entered By: Levan Hurst on 08/25/2020 10:41:15

## 2020-08-28 NOTE — Progress Notes (Signed)
[name], [name] ([id number]) Visit Report for 08/24/2020 SuperBill Details Patient Name: Date of Service: [name], [name] 08/24/2020 Medical Record Number: [id number] Patient Account Number: [id number] Date of Birth/Sex: Treating RN: 16-Feb-1951 (70 y.o. [name] Primary Care Provider: [name], [place] Other Clinician: Referring Provider: Treating Provider/Extender: [name], [name] Weeks in Treatment: 2 Diagnosis Coding ICD-10 Codes Code Description N30.41 Irradiation cystitis with hematuria Z85.46 Personal history of malignant neoplasm of prostate Facility Procedures CPT4 Code Description Modifier Quantity 81025486 G0277-(Facility Use Only) HBOT full body chamber, 92min , 4 Physician Procedures Quantity CPT4 Code Description Modifier 2824175 30104 - WC PHYS HYPERBARIC OXYGEN THERAPY 1 ICD-10 Diagnosis Description N30.41 Irradiation cystitis with hematuria Z85.46 Personal history of malignant neoplasm of prostate Electronic Signature(s) Signed: 08/24/2020 4:52:14 PM By: Linton Ham MD Signed: 08/28/2020 5:27:53 PM By: Levan Hurst RN, BSN Entered By: Levan Hurst on 08/24/2020 10:47:17

## 2020-08-28 NOTE — Progress Notes (Signed)
[name], [name] ([id number]) Visit Report for 08/25/2020 Arrival Information Details Patient Name: Date of Service: [name], [name] 08/25/2020 8:00 A M Medical Record Number: [id number] Patient Account Number: [id number] Date of Birth/Sex: Treating RN: 1950/04/11 (69 y.o. [name] Primary Care Naviah Belfield: [name], [place] Other Clinician: Referring Cindra Austad: Treating Pio Eatherly/Extender: [name], [name] Weeks in Treatment: 2 Visit Information History Since Last Visit Added or deleted any medications: No Patient Arrived: Walker Any new allergies or adverse reactions: No Arrival Time: 07:42 Had a fall or experienced change in No Accompanied By: alone activities of daily living that may affect Transfer Assistance: None risk of falls: Patient Identification Verified: Yes Signs or symptoms of abuse/neglect since last visito No Secondary Verification Process Completed: Yes Hospitalized since last visit: No Patient Requires Transmission-Based Precautions: No Implantable device outside of the clinic excluding No Patient Has Alerts: Yes cellular tissue based products placed in the center Patient Alerts: BP Right Arm Only since last visit: Pain Present Now: No Electronic Signature(s) Signed: 08/28/2020 5:27:53 PM By: Levan Hurst RN, BSN Entered By: Levan Hurst on 08/25/2020 10:40:09 -------------------------------------------------------------------------------- Encounter Discharge Information Details Patient Name: Date of Service: [name], [place] 08/25/2020 8:00 A M Medical Record Number: [id number] Patient Account Number: [id number] Date of Birth/Sex: Treating RN: 1950-04-25 (70 y.o. [name] Primary Care Tamilyn Lupien: [name], [place] Other Clinician: Referring Dionisios Ricci: Treating Iam Lipson/Extender: [name], [name] Weeks in Treatment: 2 Encounter Discharge Information Items Discharge Condition: Stable Ambulatory Status: Walker Discharge  Destination: Home Transportation: Private Auto Accompanied By: alone Schedule Follow-up Appointment: Yes Clinical Summary of Care: Patient Declined Electronic Signature(s) Signed: 08/28/2020 5:27:53 PM By: Levan Hurst RN, BSN Entered By: Levan Hurst on 08/25/2020 10:42:44 -------------------------------------------------------------------------------- Vitals Details Patient Name: Date of Service: [name], [name] 08/25/2020 8:00 A M Medical Record Number: [id number] Patient Account Number: [id number] Date of Birth/Sex: Treating RN: 12/05/1950 (70 y.o. [name] Primary Care Amalie Koran: [name], [place] Other Clinician: Referring Diago Haik: Treating Rie Mcneil/Extender: [name], [name] Weeks in Treatment: 2 Vital Signs Time Taken: 07:42 Temperature (F): 98.8 Height (in): 70 Pulse (bpm): 75 Weight (lbs): 195 Respiratory Rate (breaths/min): 18 Body Mass Index (BMI): 28 Blood Pressure (mmHg): 105/43 Capillary Blood Glucose (mg/dl): 180 Reference Range: 80 - 120 mg / dl Electronic Signature(s) Signed: 08/28/2020 5:27:53 PM By: Levan Hurst RN, BSN Entered By: Levan Hurst on 08/25/2020 10:40:32

## 2020-08-28 NOTE — Progress Notes (Signed)
[name], [name] ([id number]) Visit Report for 08/25/2020 SuperBill Details Patient Name: Date of Service: [name], [name] 08/25/2020 Medical Record Number: [id number] Patient Account Number: [id number] Date of Birth/Sex: Treating RN: 01-23-1951 (70 y.o. [name] Primary Care Provider: [name], [place] Other Clinician: Referring Provider: Treating Provider/Extender: [name], [name] Weeks in Treatment: 2 Diagnosis Coding ICD-10 Codes Code Description N30.41 Irradiation cystitis with hematuria Z85.46 Personal history of malignant neoplasm of prostate Facility Procedures CPT4 Code Description Modifier Quantity 35573220 G0277-(Facility Use Only) HBOT full body chamber, 22min , 4 Physician Procedures Quantity CPT4 Code Description Modifier 2542706 23762 - WC PHYS HYPERBARIC OXYGEN THERAPY 1 ICD-10 Diagnosis Description N30.41 Irradiation cystitis with hematuria Z85.46 Personal history of malignant neoplasm of prostate Electronic Signature(s) Signed: 08/25/2020 4:33:12 PM By: Linton Ham MD Signed: 08/28/2020 5:27:53 PM By: Levan Hurst RN, BSN Entered By: Levan Hurst on 08/25/2020 10:42:20

## 2020-08-28 NOTE — Progress Notes (Signed)
[name] ([id number]) Visit Report for 08/28/2020 Arrival Information Details Patient Name: Date of Service: [name], [name] 08/28/2020 8:00 A M Medical Record Number: [id number] Patient Account Number: [id number] Date of Birth/Sex: Treating RN: [birth year] (70 y.o. [name] Primary Care [name]: [name], [place] Other Clinician: Referring [name]: Treating [name]/Extender: [name], NA TA [name]: 3 Visit Information History Since Last Visit Added or deleted any medications: No Patient Arrived: Walker Any new allergies or adverse reactions: No Arrival Time: 07:53 Had a fall or experienced change in No Accompanied By: self activities of daily living that may affect Transfer Assistance: None risk of falls: Patient Identification Verified: Yes Signs or symptoms of abuse/neglect since last visito No Secondary Verification Process Completed: Yes Hospitalized since last visit: No Patient Requires Transmission-Based Precautions: No Implantable device outside of the clinic excluding No Patient Has Alerts: Yes cellular tissue based products placed in the center Patient Alerts: BP Right Arm Only since last visit: Pain Present Now: No Electronic Signature(s) Signed: 08/28/2020 6:20:16 PM By: [name] Entered By: [name] on 08/28/2020 11:00:04 -------------------------------------------------------------------------------- Encounter Discharge Information Details Patient Name: Date of Service: [name], [name] 08/28/2020 8:00 A M Medical Record Number: [id number] Patient Account Number: [id number] Date of Birth/Sex: Treating RN: 1950-07-19 (70 y.o. [name] Primary Care Sonyia Muro: [name], [place] Other Clinician: Referring Zandon Talton: Treating Birch Farino/Extender: [name], NA TA [name]: 3 Encounter Discharge Information Items Discharge Condition: Stable Ambulatory Status: Walker Discharge Destination:  Home Transportation: Private Auto Accompanied By: self Schedule Follow-up Appointment: Yes Clinical Summary of Care: Electronic Signature(s) Signed: 08/28/2020 6:20:16 PM By: [name] Entered By: [name] on 08/28/2020 11:02:53 -------------------------------------------------------------------------------- Vitals Details Patient Name: Date of Service: [name], [name] 08/28/2020 8:00 A M Medical Record Number: [id number] Patient Account Number: [id number] Date of Birth/Sex: Treating RN: Jul 29, 1950 (70 y.o. [name] Primary Care Kaizer Dissinger: [name], [place] Other Clinician: Referring Enma Maeda: Treating Danisa Kopec/Extender: [name], NA TA [name]: 3 Vital Signs Time Taken: 07:53 Temperature (F): 98 Height (in): 70 Pulse (bpm): 72 Weight (lbs): 195 Respiratory Rate (breaths/min): 18 Body Mass Index (BMI): 28 Blood Pressure (mmHg): 104/43 Capillary Blood Glucose (mg/dl): 137 Reference Range: 80 - 120 mg / dl Notes glucerna provided. Electronic Signature(s) Signed: 08/28/2020 6:20:16 PM By: [name] Entered By: [name] on 08/28/2020 11:00:35

## 2020-08-28 NOTE — Progress Notes (Signed)
[name], [name] ([id number]) Visit Report for 08/24/2020 Arrival Information Details Patient Name: Date of Service: [name], [name] 08/24/2020 8:00 A M Medical Record Number: [id number] Patient Account Number: [id number] Date of Birth/Sex: Treating RN: April 10, 1950 (70 y.o. [name] Primary Care Zailey Audia: [name], [place] Other Clinician: Referring Calvyn Kurtzman: Treating Teryn Boerema/Extender: [name], [name] Weeks in Treatment: 2 Visit Information History Since Last Visit Added or deleted any medications: No Patient Arrived: [place] Any new allergies or adverse reactions: No Arrival Time: 07:44 Had a fall or experienced change in No Accompanied By: alone activities of daily living that may affect Transfer Assistance: None risk of falls: Patient Identification Verified: Yes Signs or symptoms of abuse/neglect since last visito No Secondary Verification Process Completed: Yes Hospitalized since last visit: No Patient Requires Transmission-Based Precautions: No Implantable device outside of the clinic excluding No Patient Has Alerts: Yes cellular tissue based products placed in the center Patient Alerts: BP Right Arm Only since last visit: Pain Present Now: No Electronic Signature(s) Signed: 08/28/2020 5:27:53 PM By: Levan Hurst RN, BSN Entered By: Levan Hurst on 08/24/2020 08:34:50 -------------------------------------------------------------------------------- Encounter Discharge Information Details Patient Name: Date of Service: [name], [name] 08/24/2020 8:00 A M Medical Record Number: [id number] Patient Account Number: [id number] Date of Birth/Sex: Treating RN: [birth year] (70 y.o. [name] Primary Care Nickalaus Crooke: [name], [place] Other Clinician: Referring Corynne Scibilia: Treating Breannah Kratt/Extender: [name], [name] Weeks in Treatment: 2 Encounter Discharge Information Items Discharge Condition: Stable Ambulatory Status: Walker Discharge  Destination: Home Transportation: Private Auto Accompanied By: alone Schedule Follow-up Appointment: Yes Clinical Summary of Care: Patient Declined Electronic Signature(s) Signed: 08/28/2020 5:27:53 PM By: Levan Hurst RN, BSN Entered By: Levan Hurst on 08/24/2020 10:47:35 -------------------------------------------------------------------------------- [place] Details Patient Name: Date of Service: [name], [name] 08/24/2020 8:00 A M Medical Record Number: [id number] Patient Account Number: [id number] Date of Birth/Sex: Treating RN: 1950/06/10 (70 y.o. [name] Primary Care Marino Rogerson: [name], [place] Other Clinician: Referring Yaroslav Gombos: Treating Minah Axelrod/Extender: [name], [name] Weeks in Treatment: 2 Vital Signs Time Taken: 07:44 Temperature (F): 98.7 Height (in): 70 Pulse (bpm): 67 Weight (lbs): 195 Respiratory Rate (breaths/min): 18 Body Mass Index (BMI): 28 Blood Pressure (mmHg): 116/43 Capillary Blood Glucose (mg/dl): 188 Reference Range: 80 - 120 mg / dl Electronic Signature(s) Signed: 08/28/2020 5:27:53 PM By: Levan Hurst RN, BSN Entered By: Levan Hurst on 08/24/2020 08:35:17

## 2020-08-29 ENCOUNTER — Other Ambulatory Visit: Payer: Self-pay

## 2020-08-29 ENCOUNTER — Encounter (HOSPITAL_BASED_OUTPATIENT_CLINIC_OR_DEPARTMENT_OTHER): Payer: Medicare Other | Admitting: Internal Medicine

## 2020-08-29 DIAGNOSIS — N3041 Irradiation cystitis with hematuria: Secondary | ICD-10-CM | POA: Diagnosis not present

## 2020-08-29 LAB — GLUCOSE, CAPILLARY
Glucose-Capillary: 139 mg/dL — ABNORMAL HIGH (ref 70–99)
Glucose-Capillary: 178 mg/dL — ABNORMAL HIGH (ref 70–99)

## 2020-08-29 NOTE — Progress Notes (Signed)
SARGON, SCOUTEN ([id number]) Visit Report for 08/29/2020 Arrival Information Details Patient Name: Date of Service: [name], [name] 08/29/2020 8:00 A M Medical Record Number: [id number] Patient Account Number: [id number] Date of Birth/Sex: Treating RN: [birth year] (70 y.o. [name], [name] Primary Care [name]: [name], [place] TA LIE Other Clinician: Referring [name]: Treating [name]: [name], [name] Weeks in Treatment: 3 Visit Information History Since Last Visit Added or deleted any medications: No Patient Arrived: [place] Any new allergies or adverse reactions: No Arrival Time: 07:50 Had a fall or experienced change in No Accompanied By: self activities of daily living that may affect Transfer Assistance: None risk of falls: Patient Identification Verified: Yes Signs or symptoms of abuse/neglect since last visito No Secondary Verification Process Completed: Yes Hospitalized since last visit: No Patient Requires Transmission-Based Precautions: No Implantable device outside of the clinic excluding No Patient Has Alerts: Yes cellular tissue based products placed in the center Patient Alerts: BP Right Arm Only since last visit: Pain Present Now: No Electronic Signature(s) Signed: 08/29/2020 5:25:37 PM By: Deon Pilling Entered By: Deon Pilling on 08/29/2020 10:35:53 -------------------------------------------------------------------------------- Encounter Discharge Information Details Patient Name: Date of Service: [name], [name] 08/29/2020 8:00 A M Medical Record Number: [id number] Patient Account Number: [id number] Date of Birth/Sex: Treating RN: [birth year] (70 y.o. [name] Primary Care [name]: [name], [place] Other Clinician: Referring [name]: Treating [name]: [name], [name] Weeks in Treatment: 3 Encounter Discharge Information Items Discharge Condition: Stable Ambulatory Status: Walker Discharge Destination: Home Transportation: Private  Auto Accompanied By: self Schedule Follow-up Appointment: Yes Clinical Summary of Care: Electronic Signature(s) Signed: 08/29/2020 5:25:37 PM By: Deon Pilling Entered By: Deon Pilling on 08/29/2020 10:38:31 -------------------------------------------------------------------------------- Vitals Details Patient Name: Date of Service: [name], [name] 08/29/2020 8:00 A M Medical Record Number: [id number] Patient Account Number: [id number] Date of Birth/Sex: Treating RN: [birth year] (70 y.o. [name] Primary Care [name]: [name], [place] TA LIE Other Clinician: Referring [name]: Treating [name]: [name], [name] Weeks in Treatment: 3 Vital Signs Time Taken: 07:50 Temperature (F): 97.7 Height (in): 70 Pulse (bpm): 75 Weight (lbs): 195 Respiratory Rate (breaths/min): 16 Body Mass Index (BMI): 28 Blood Pressure (mmHg): 137/52 Capillary Blood Glucose (mg/dl): 139 Reference Range: 80 - 120 mg / dl Electronic Signature(s) Signed: 08/29/2020 5:25:37 PM By: Deon Pilling Entered By: Deon Pilling on 08/29/2020 10:36:10

## 2020-08-29 NOTE — Progress Notes (Signed)
[name], [name] ([id number]) Visit Report for 08/29/2020 SuperBill Details Patient Name: Date of Service: [name], [name] 08/29/2020 Medical Record Number: [id number] Patient Account Number: [id number] Date of Birth/Sex: Treating RN: 1951/01/07 (70 y.o. [name] Primary Care Provider: [name], [place] Other Clinician: Referring Provider: Treating Provider/Extender: [name], [name] Weeks in Treatment: 3 Diagnosis Coding ICD-10 Codes Code Description N30.41 Irradiation cystitis with hematuria Z85.46 Personal history of malignant neoplasm of prostate Facility Procedures CPT4 Code Description Modifier Quantity 25525894 G0277-(Facility Use Only) HBOT full body chamber, 60min , 4 Physician Procedures Quantity CPT4 Code Description Modifier [id number] 07460 - WC PHYS HYPERBARIC OXYGEN THERAPY 1 ICD-10 Diagnosis Description N30.41 Irradiation cystitis with hematuria Electronic Signature(s) Signed: 08/29/2020 5:25:37 PM By: Deon Pilling Entered By: Deon Pilling on 08/29/2020 10:38:18

## 2020-08-30 ENCOUNTER — Encounter (HOSPITAL_BASED_OUTPATIENT_CLINIC_OR_DEPARTMENT_OTHER): Payer: Medicare Other | Admitting: Internal Medicine

## 2020-08-30 ENCOUNTER — Other Ambulatory Visit: Payer: Self-pay

## 2020-08-30 DIAGNOSIS — N3041 Irradiation cystitis with hematuria: Secondary | ICD-10-CM | POA: Diagnosis not present

## 2020-08-30 LAB — GLUCOSE, CAPILLARY
Glucose-Capillary: 175 mg/dL — ABNORMAL HIGH (ref 70–99)
Glucose-Capillary: 177 mg/dL — ABNORMAL HIGH (ref 70–99)

## 2020-08-30 NOTE — Progress Notes (Signed)
EBUBECHUKWU, JEDLICKA ([id number]) Visit Report for 08/30/2020 Arrival Information Details Patient Name: Date of Service: CAINEN, BURNHAM 08/30/2020 8:00 A M Medical Record Number: [id number] Patient Account Number: [id number] Date of Birth/Sex: Treating RN: Dec 13, 1950 (70 y.o. [name], [name] Primary Care Latha Staunton: [name], [place] TA LIE Other Clinician: Referring Shakur Lembo: Treating Teigan Sahli/Extender: [name], [name] Weeks in Treatment: 3 Visit Information History Since Last Visit Added or deleted any medications: No Patient Arrived: Walker Any new allergies or adverse reactions: No Arrival Time: 07:45 Had a fall or experienced change in No Accompanied By: self activities of daily living that may affect Transfer Assistance: None risk of falls: Patient Identification Verified: Yes Signs or symptoms of abuse/neglect since last visito No Secondary Verification Process Completed: Yes Hospitalized since last visit: No Patient Requires Transmission-Based Precautions: No Implantable device outside of the clinic excluding No Patient Has Alerts: Yes cellular tissue based products placed in the center Patient Alerts: BP Right Arm Only since last visit: Pain Present Now: No Electronic Signature(s) Signed: 08/30/2020 4:06:25 PM By: Deon Pilling Entered By: Deon Pilling on 08/30/2020 10:49:24 -------------------------------------------------------------------------------- Encounter Discharge Information Details Patient Name: Date of Service: [name], [place] 08/30/2020 8:00 A M Medical Record Number: [id number] Patient Account Number: [id number] Date of Birth/Sex: Treating RN: 1950-06-15 (70 y.o. [name] Primary Care Ayrianna Mcginniss: [name], [place] Other Clinician: Referring Verlin Duke: Treating Kimiko Common/Extender: [name], [name] Weeks in Treatment: 3 Encounter Discharge Information Items Discharge Condition: Stable Ambulatory Status: Walker Discharge Destination: Home Transportation: Private  Auto Accompanied By: self Schedule Follow-up Appointment: Yes Clinical Summary of Care: Electronic Signature(s) Signed: 08/30/2020 4:06:25 PM By: Deon Pilling Entered By: Deon Pilling on 08/30/2020 10:51:24 -------------------------------------------------------------------------------- Vitals Details Patient Name: Date of Service: [name], [place] 08/30/2020 8:00 A M Medical Record Number: [id number] Patient Account Number: [id number] Date of Birth/Sex: Treating RN: 15-Mar-1951 (70 y.o. [name] Primary Care Glena Pharris: [name], [place] TA LIE Other Clinician: Referring Batsheva Stevick: Treating Aleem Elza/Extender: [name], [name] Weeks in Treatment: 3 Vital Signs Time Taken: 07:45 Temperature (F): 98.1 Height (in): 70 Pulse (bpm): 70 Weight (lbs): 195 Respiratory Rate (breaths/min): 16 Body Mass Index (BMI): 28 Blood Pressure (mmHg): 102/45 Capillary Blood Glucose (mg/dl): 175 Reference Range: 80 - 120 mg / dl Electronic Signature(s) Signed: 08/30/2020 4:06:25 PM By: Deon Pilling Entered By: Deon Pilling on 08/30/2020 10:49:48

## 2020-08-30 NOTE — Progress Notes (Signed)
[name], [name] ([id number]) Visit Report for 08/30/2020 SuperBill Details Patient Name: Date of Service: [name], [name] 08/30/2020 Medical Record Number: [id number] Patient Account Number: [id number] Date of Birth/Sex: Treating RN: 1950/07/16 (70 y.o. [name] Primary Care Jenie Parish: [name], [place] Other Clinician: Referring Maritsa Hunsucker: Treating Mikiala Fugett/Extender: [name], [name] Weeks in Treatment: 3 Diagnosis Coding ICD-10 Codes Code Description N30.41 Irradiation cystitis with hematuria Z85.46 Personal history of malignant neoplasm of prostate Facility Procedures CPT4 Code Description Modifier Quantity [id number] G0277-(Facility Use Only) HBOT full body chamber, 28min , 4 Physician Procedures Quantity CPT4 Code Description Modifier [id number] 34196 - WC PHYS HYPERBARIC OXYGEN THERAPY 1 ICD-10 Diagnosis Description N30.41 Irradiation cystitis with hematuria Electronic Signature(s) Signed: 08/30/2020 4:06:25 PM By: Deon Pilling Entered By: Deon Pilling on 08/30/2020 10:51:10

## 2020-08-31 ENCOUNTER — Other Ambulatory Visit: Payer: Self-pay

## 2020-08-31 ENCOUNTER — Encounter (HOSPITAL_BASED_OUTPATIENT_CLINIC_OR_DEPARTMENT_OTHER): Payer: Medicare Other | Admitting: Internal Medicine

## 2020-08-31 DIAGNOSIS — Z8546 Personal history of malignant neoplasm of prostate: Secondary | ICD-10-CM | POA: Diagnosis not present

## 2020-08-31 DIAGNOSIS — N3041 Irradiation cystitis with hematuria: Secondary | ICD-10-CM

## 2020-08-31 LAB — GLUCOSE, CAPILLARY
Glucose-Capillary: 149 mg/dL — ABNORMAL HIGH (ref 70–99)
Glucose-Capillary: 174 mg/dL — ABNORMAL HIGH (ref 70–99)

## 2020-09-01 ENCOUNTER — Other Ambulatory Visit: Payer: Self-pay

## 2020-09-01 ENCOUNTER — Encounter (HOSPITAL_BASED_OUTPATIENT_CLINIC_OR_DEPARTMENT_OTHER): Payer: Medicare Other | Admitting: Internal Medicine

## 2020-09-01 DIAGNOSIS — N3041 Irradiation cystitis with hematuria: Secondary | ICD-10-CM

## 2020-09-01 DIAGNOSIS — Z8546 Personal history of malignant neoplasm of prostate: Secondary | ICD-10-CM | POA: Diagnosis not present

## 2020-09-01 LAB — GLUCOSE, CAPILLARY
Glucose-Capillary: 175 mg/dL — ABNORMAL HIGH (ref 70–99)
Glucose-Capillary: 145 mg/dL — ABNORMAL HIGH (ref 70–99)

## 2020-09-05 ENCOUNTER — Encounter (HOSPITAL_BASED_OUTPATIENT_CLINIC_OR_DEPARTMENT_OTHER): Payer: Medicare Other | Admitting: Internal Medicine

## 2020-09-05 ENCOUNTER — Other Ambulatory Visit: Payer: Self-pay

## 2020-09-05 DIAGNOSIS — N3041 Irradiation cystitis with hematuria: Secondary | ICD-10-CM | POA: Diagnosis not present

## 2020-09-05 LAB — GLUCOSE, CAPILLARY
Glucose-Capillary: 171 mg/dL — ABNORMAL HIGH (ref 70–99)
Glucose-Capillary: 160 mg/dL — ABNORMAL HIGH (ref 70–99)

## 2020-09-05 NOTE — Progress Notes (Signed)
[name] ([id number]) Visit Report for 09/05/2020 HBO Details Patient Name: Date of Service: [name], [name] 09/05/2020 8:00 A M Medical Record Number: [id number] Patient Account Number: [id number] Date of Birth/Sex: Treating RN: 02-23-1951 (70 y.o. [name] Primary Care Holle Sprick: [name], [place] Other Clinician: Referring Makinzee Durley: Treating Ignatius Kloos/Extender: [name], [name] Weeks in Treatment: 4 HBO Treatment Course Details Treatment Course Number: 1 Ordering Ladean Steinmeyer: Linton Ham T Treatments Ordered: otal 40 HBO Treatment Start Date: 08/14/2020 HBO Indication: Late Effect of Radiation Notes: Radiation Cystitis HBO Treatment Details Treatment Number: 16 Patient Type: Outpatient Chamber Type: Monoplace Chamber Serial #: [id number] Treatment Protocol: 2.5 ATA with 90 minutes oxygen, with two 5 minute air breaks Treatment Details Compression Rate Down: 2.0 psi / minute De-Compression Rate Up: 2.0 psi / minute A breaks and ir Compress Tx Pressure breathing periods Decompress Decompress Begins Reached (leave unused spaces Begins Ends blank) Chamber Pressure (ATA 1 2.5 2.5 2.5 2.5 2.5 - - 2.5 1 ) Clock Time (24 hr) 07:58 08:11 08:40 08:45 - - - - 08:58 09:08 Treatment Length: 70 (minutes) Treatment Segments: 2 Vital Signs Capillary Blood Glucose Reference Range: 80 - 120 mg / dl HBO Diabetic Blood Glucose Intervention Range: <131 mg/dl or >249 mg/dl Time Vitals Blood Respiratory Capillary Blood Glucose Pulse Action Type: Pulse: Temperature: Taken: Pressure: Rate: Glucose (mg/dl): Meter #: Oximetry (%) Taken: Pre 07:42 136/59 71 20 98.1 171 Post 09:30 136/59 71 20 98.1 160 Treatment Response Treatment Toleration: Well Treatment Completion Status: Treatment Aborted/Not Restarted Reason: Patient Choice Treatment Notes Patient c/o needing to have a bowel movement at 0858 started decompression. Vital signs taken after patient was removed from  chamber and after using the restroom. MD made aware. Patient to return tomorrow for another hyberbaric treatment. Candelario Steppe Notes Patient has had an early because of requiring to use the bathroom. Physician HBO Attestation: I certify that I supervised this HBO treatment in accordance with Medicare guidelines. A trained emergency response team is readily available per Yes hospital policies and procedures. Continue HBOT as ordered. Yes Electronic Signature(s) Signed: 09/05/2020 5:02:17 PM By: Linton Ham MD Entered By: Linton Ham on 09/05/2020 13:17:59 -------------------------------------------------------------------------------- HBO Safety Checklist Details Patient Name: Date of Service: [name], [name] 09/05/2020 8:00 A M Medical Record Number: [id number] Patient Account Number: [id number] Date of Birth/Sex: Treating RN: 1950/06/23 (70 y.o. [name] Primary Care Wood Novacek: [name], [place] Other Clinician: Referring Cleburne Savini: Treating Alisyn Lequire/Extender: [name], [name] Weeks in Treatment: 4 HBO Safety Checklist Items Safety Checklist Consent Form Signed Patient voided / foley secured and emptied When did you last eato this morning 2 boiled eggs, toast, coffee Last dose of injectable or oral agent weekly on Friday Ostomy pouch emptied and vented if applicable NA All implantable devices assessed, documented and approved Intravenous access site secured and place NA Valuables secured Linens and cotton and cotton/polyester blend (less than 51% polyester) Personal oil-based products / skin lotions / body lotions removed NA Wigs or hairpieces removed NA Smoking or tobacco materials removed NA Books / newspapers / magazines / loose paper removed NA Cologne, aftershave, perfume and deodorant removed Jewelry removed (may wrap wedding band) Make-up removed NA Hair care products removed NA Battery operated devices (external) removed NA Heating  patches and chemical warmers removed NA Titanium eyewear removed Nail polish cured greater than 10 hours NA Casting material cured greater than 10 hours NA Hearing aids removed NA Loose dentures or partials removed NA  Prosthetics have been removed NA Patient demonstrates correct use of air break device (if applicable) Patient concerns have been addressed Patient grounding bracelet on and cord attached to chamber Specifics for Inpatients (complete in addition to above) Medication sheet sent with patient Intravenous medications needed or due during therapy sent with patient Drainage tubes (e.g. nasogastric tube or chest tube secured and vented) Endotracheal or Tracheotomy tube secured Cuff deflated of air and inflated with saline Airway suctioned Electronic Signature(s) Signed: 09/05/2020 5:34:16 PM By: Deon Pilling Entered By: Deon Pilling on 09/05/2020 10:37:14

## 2020-09-05 NOTE — Progress Notes (Addendum)
[name], [name] ([id number]) Visit Report for 09/05/2020 SuperBill Details Patient Name: Date of Service: [name], [name] 09/05/2020 Medical Record Number: [id number] Patient Account Number: [id number] Date of Birth/Sex: Treating RN: 06/17/1950 (70 y.o. [name] Primary Care Provider: [name], [place] Other Clinician: Referring Provider: Treating Provider/Extender: [name], [name] Weeks in Treatment: 4 Diagnosis Coding ICD-10 Codes Code Description N30.41 Irradiation cystitis with hematuria Z85.46 Personal history of malignant neoplasm of prostate Facility Procedures CPT4 Code Description Modifier Quantity [id number] G0277-(Facility Use Only) HBOT full body chamber, 54min , 2 Physician Procedures Quantity CPT4 Code Description Modifier [id number] 81388 - WC PHYS HYPERBARIC OXYGEN THERAPY 1 ICD-10 Diagnosis Description N30.41 Irradiation cystitis with hematuria Electronic Signature(s) Signed: 09/07/2020 10:11:38 AM By: Maye Hides Signed: 09/07/2020 4:50:31 PM By: Linton Ham MD Previous Signature: 09/05/2020 5:02:17 PM Version By: Linton Ham MD Previous Signature: 09/05/2020 5:34:16 PM Version By: Deon Pilling Entered By: Maye Hides on 09/07/2020 10:11:38

## 2020-09-05 NOTE — Progress Notes (Signed)
[name], [name] ([id number]) Visit Report for 08/31/2020 Arrival Information Details Patient Name: Date of Service: LAVARIUS, DOUGHTEN 08/31/2020 8:00 A M Medical Record Number: [id number] Patient Account Number: [id number] Date of Birth/Sex: Treating RN: [birth year] (70 y.o. [name] Primary Care Velina Drollinger: [name], [place] Other Clinician: Referring Antasia Haider: Treating Tavious Griesinger/Extender: Levin Bacon, [name] Weeks in Treatment: 3 Visit Information History Since Last Visit Added or deleted any medications: No Patient Arrived: Walker Any new allergies or adverse reactions: No Arrival Time: 07:42 Had a fall or experienced change in No Accompanied By: alone activities of daily living that may affect Transfer Assistance: None risk of falls: Patient Identification Verified: Yes Signs or symptoms of abuse/neglect since last visito No Secondary Verification Process Completed: Yes Hospitalized since last visit: No Patient Requires Transmission-Based Precautions: No Implantable device outside of the clinic excluding No Patient Has Alerts: Yes cellular tissue based products placed in the center Patient Alerts: BP Right Arm Only since last visit: Pain Present Now: No Electronic Signature(s) Signed: 09/05/2020 5:56:30 PM By: Levan Hurst RN, BSN Entered By: Levan Hurst on 08/31/2020 08:49:28 -------------------------------------------------------------------------------- Encounter Discharge Information Details Patient Name: Date of Service: [name], [place] 08/31/2020 8:00 A M Medical Record Number: [id number] Patient Account Number: [id number] Date of Birth/Sex: Treating RN: 1950-06-21 (70 y.o. [name] Primary Care Velinda Wrobel: [name], [place] Other Clinician: Referring Oluwaseyi Tull: Treating Morning Halberg/Extender: Levin Bacon, [name] Weeks in Treatment: 3 Encounter Discharge Information Items Discharge Condition: Stable Ambulatory Status: Walker Discharge  Destination: Home Transportation: Private Auto Accompanied By: alone Schedule Follow-up Appointment: Yes Clinical Summary of Care: Patient Declined Electronic Signature(s) Signed: 09/05/2020 5:56:30 PM By: Levan Hurst RN, BSN Entered By: Levan Hurst on 08/31/2020 10:59:45 -------------------------------------------------------------------------------- Vitals Details Patient Name: Date of Service: [name], [place] 08/31/2020 8:00 A M Medical Record Number: [id number] Patient Account Number: [id number] Date of Birth/Sex: Treating RN: 04/17/1950 (70 y.o. [name] Primary Care Tinlee Navarrette: [name], [place] Other Clinician: Referring Sharonne Ricketts: Treating Jeraldine Primeau/Extender: Levin Bacon, [name] Weeks in Treatment: 3 Vital Signs Time Taken: 07:42 Temperature (F): 97.9 Height (in): 70 Pulse (bpm): 71 Weight (lbs): 195 Respiratory Rate (breaths/min): 16 Body Mass Index (BMI): 28 Blood Pressure (mmHg): 103/47 Capillary Blood Glucose (mg/dl): 149 Reference Range: 80 - 120 mg / dl Electronic Signature(s) Signed: 09/05/2020 5:56:30 PM By: Levan Hurst RN, BSN Entered By: Levan Hurst on 08/31/2020 08:49:58

## 2020-09-05 NOTE — Progress Notes (Signed)
[name] ([id number]) Visit Report for 09/01/2020 Arrival Information Details Patient Name: Date of Service: [name], [name] 09/01/2020 8:00 A M Medical Record Number: [id number] Patient Account Number: [id number] Date of Birth/Sex: Treating RN: 15-Apr-1950 (70 y.o. [name] Primary Care Rece [name], [place] Other Clinician: Referring Nikeria Kalman: Treating Jamis Kryder/Extender: Levin Bacon, [name]: 3 Visit Information History Since Last Visit Added or deleted any medications: No Patient Arrived: Walker Any new allergies or adverse reactions: No Arrival Time: 07:45 Had a fall or experienced change in No Accompanied By: alone activities of daily living that may affect Transfer Assistance: None risk of falls: Patient Identification Verified: Yes Signs or symptoms of abuse/neglect since last visito No Secondary Verification Process Completed: Yes Hospitalized since last visit: No Patient Requires Transmission-Based Precautions: No Implantable device outside of the clinic excluding No Patient Has Alerts: Yes cellular tissue based products placed in the center Patient Alerts: BP Right Arm Only since last visit: Pain Present Now: No Electronic Signature(s) Signed: 09/05/2020 5:56:30 PM By: Levan Hurst RN, BSN Entered By: Levan Hurst on 09/01/2020 10:11:14 -------------------------------------------------------------------------------- Encounter Discharge Information Details Patient Name: Date of Service: [name], [name] 09/01/2020 8:00 A M Medical Record Number: [id number] Patient Account Number: [id number] Date of Birth/Sex: Treating RN: 05/27/1950 (70 y.o. [name] Primary Care [name], [place] Other Clinician: Referring Kattleya Kuhnert: Treating Chisum Habenicht/Extender: Levin Bacon, [name]: 3 Encounter Discharge Information Items Discharge Condition: Stable Ambulatory Status: Walker Discharge  Destination: Home Transportation: Private Auto Accompanied By: alone Schedule Follow-up Appointment: Yes Clinical Summary of Care: Patient Declined Electronic Signature(s) Signed: 09/05/2020 5:56:30 PM By: Levan Hurst RN, BSN Entered By: Levan Hurst on 09/01/2020 13:10:20 -------------------------------------------------------------------------------- Vitals Details Patient Name: Date of Service: [name], [name] 09/01/2020 8:00 A M Medical Record Number: [id number] Patient Account Number: [id number] Date of Birth/Sex: Treating RN: [birth year] (70 y.o. [name] Primary Care Earma Nicolaou: Tamala [name], [place] Other Clinician: Referring Keyondre Hepburn: Treating Jora Galluzzo/Extender: Levin Bacon, [name]: 3 Vital Signs Time Taken: 07:45 Temperature (F): 98.5 Height (in): 70 Pulse (bpm): 72 Weight (lbs): 195 Respiratory Rate (breaths/min): 16 Body Mass Index (BMI): 28 Blood Pressure (mmHg): 111/46 Capillary Blood Glucose (mg/dl): 174 Reference Range: 80 - 120 mg / dl Electronic Signature(s) Signed: 09/05/2020 5:56:30 PM By: Levan Hurst RN, BSN Entered By: Levan Hurst on 09/01/2020 10:11:33

## 2020-09-05 NOTE — Progress Notes (Signed)
[name], [name] ([id number]) Visit Report for 09/05/2020 Arrival Information Details Patient Name: Date of Service: [name], [name] 09/05/2020 8:00 A M Medical Record Number: [id number] Patient Account Number: [id number] Date of Birth/Sex: Treating RN: 22-Jun-1950 (70 y.o. [name] Primary Care Rashawn Rayman: [name], [place] Other Clinician: Referring Nadina Fomby: Treating Ketih Goodie/Extender: [name], [name] Weeks in Treatment: 4 Visit Information History Since Last Visit Added or deleted any medications: No Patient Arrived: [name] Any new allergies or adverse reactions: No Arrival Time: 07:42 Had a fall or experienced change in No Accompanied By: self activities of daily living that may affect Transfer Assistance: None risk of falls: Patient Identification Verified: Yes Signs or symptoms of abuse/neglect since last visito No Secondary Verification Process Completed: Yes Hospitalized since last visit: No Patient Requires Transmission-Based Precautions: No Implantable device outside of the clinic excluding No Patient Has Alerts: Yes cellular tissue based products placed in the center Patient Alerts: BP Right Arm Only since last visit: Pain Present Now: No Electronic Signature(s) Signed: 09/05/2020 5:34:16 PM By: Deon Pilling Entered By: Deon Pilling on 09/05/2020 10:36:26 -------------------------------------------------------------------------------- Encounter Discharge Information Details Patient Name: Date of Service: [name], [name] 09/05/2020 8:00 A M Medical Record Number: [id number] Patient Account Number: [id number] Date of Birth/Sex: Treating RN: Feb 18, 1951 (70 y.o. [name] Primary Care Kasheena Sambrano: [name], [place] Other Clinician: Referring Africa Masaki: Treating Burnell Matlin/Extender: [name], [name] Weeks in Treatment: 4 Encounter Discharge Information Items Discharge Condition: Stable Ambulatory Status: Walker Discharge Destination:  Home Transportation: Private Auto Accompanied By: self Schedule Follow-up Appointment: Yes Clinical Summary of Care: Notes Patient explained would call early in the morning if unable to make schedule HBO tx if continues to have loose BMs. Per patient only complaint loose BMs. MD made aware. Electronic Signature(s) Signed: 09/05/2020 5:34:16 PM By: Deon Pilling Entered By: Deon Pilling on 09/05/2020 10:40:33 -------------------------------------------------------------------------------- Vitals Details Patient Name: Date of Service: [name], [name] 09/05/2020 8:00 A M Medical Record Number: [id number] Patient Account Number: [id number] Date of Birth/Sex: Treating RN: 05-Jul-1950 (70 y.o. [name] Primary Care Jaziyah Gradel: [name], [place] Other Clinician: Referring Leili Eskenazi: Treating Tamyra Fojtik/Extender: [name], [name] Weeks in Treatment: 4 Vital Signs Time Taken: 07:42 Temperature (F): 98.1 Height (in): 70 Pulse (bpm): 71 Weight (lbs): 195 Respiratory Rate (breaths/min): 20 Body Mass Index (BMI): 28 Blood Pressure (mmHg): 136/59 Capillary Blood Glucose (mg/dl): 171 Reference Range: 80 - 120 mg / dl Electronic Signature(s) Signed: 09/05/2020 5:34:16 PM By: Deon Pilling Entered By: Deon Pilling on 09/05/2020 10:36:38

## 2020-09-06 ENCOUNTER — Encounter (HOSPITAL_BASED_OUTPATIENT_CLINIC_OR_DEPARTMENT_OTHER): Payer: Medicare Other | Attending: Physician Assistant | Admitting: Physician Assistant

## 2020-09-06 ENCOUNTER — Other Ambulatory Visit: Payer: Self-pay

## 2020-09-06 DIAGNOSIS — N3041 Irradiation cystitis with hematuria: Secondary | ICD-10-CM | POA: Insufficient documentation

## 2020-09-06 DIAGNOSIS — Z8546 Personal history of malignant neoplasm of prostate: Secondary | ICD-10-CM | POA: Diagnosis not present

## 2020-09-06 LAB — GLUCOSE, CAPILLARY
Glucose-Capillary: 200 mg/dL — ABNORMAL HIGH (ref 70–99)
Glucose-Capillary: 166 mg/dL — ABNORMAL HIGH (ref 70–99)

## 2020-09-06 NOTE — Progress Notes (Signed)
[name] ([id number]) Visit Report for 09/06/2020 HBO Details Patient Name: Date of Service: [name], [name] 09/06/2020 8:00 A M Medical Record Number: [id number] Patient Account Number: [id number] Date of Birth/Sex: Treating RN: Mar 17, 1951 (70 y.o. [name] Primary Care Jacari Iannello: [name], [place] TA LIE Other Clinician: Referring Carolanne Mercier: Treating Jabree Pernice/Extender: Doyle Askew, [name] Weeks in Treatment: 4 HBO Treatment Course Details Treatment Course Number: 1 Ordering Alvaretta Eisenberger: Linton Ham T Treatments Ordered: otal 40 HBO Treatment Start Date: 08/14/2020 HBO Indication: Late Effect of Radiation Notes: Radiation Cystitis HBO Treatment Details Treatment Number: 17 Patient Type: Outpatient Chamber Type: Monoplace Chamber Serial #: [id number] Treatment Protocol: 2.5 ATA with 90 minutes oxygen, with two 5 minute air breaks Treatment Details Compression Rate Down: 2.0 psi / minute De-Compression Rate Up: 2.0 psi / minute A breaks and breathing ir Compress Tx Pressure periods Decompress Decompress Begins Reached (leave unused spaces Begins Ends blank) Chamber Pressure (ATA 1 2.5 2.5 2.5 2.5 2.5 - - 2.5 1 ) Clock Time (24 hr) 08:01 08:15 08:45 08:50 09:20 09:25 - - 09:55 10:05 Treatment Length: 124 (minutes) Treatment Segments: 4 Vital Signs Capillary Blood Glucose Reference Range: 80 - 120 mg / dl HBO Diabetic Blood Glucose Intervention Range: <131 mg/dl or >249 mg/dl Time Vitals Blood Respiratory Capillary Blood Glucose Pulse Action Type: Pulse: Temperature: Taken: Pressure: Rate: Glucose (mg/dl): Meter #: Oximetry (%) Taken: Pre 07:45 110/48 69 18 97.9 200 Post 10:18 132/55 63 16 97.5 166 Treatment Response Treatment Toleration: Well Treatment Completion Status: Treatment Completed without Adverse Event Electronic Signature(s) Signed: 09/06/2020 5:35:00 PM By: Worthy Keeler PA-C Signed: 09/06/2020 5:52:00 PM By: Deon Pilling Entered By: Deon Pilling on  09/06/2020 11:02:15 -------------------------------------------------------------------------------- HBO Safety Checklist Details Patient Name: Date of Service: [name], [name] 09/06/2020 8:00 A M Medical Record Number: [id number] Patient Account Number: [id number] Date of Birth/Sex: Treating RN: [birth year] (70 y.o. [name] Primary Care Jatoria Kneeland: [name], [place] Other Clinician: Referring Mikenzi Raysor: Treating Kabrea Seeney/Extender: Doyle Askew, [name] Weeks in Treatment: 4 HBO Safety Checklist Items Safety Checklist Consent Form Signed Patient voided / foley secured and emptied When did you last eato this morning x2 butter toast, coffee Last dose of injectable or oral agent weekly on Friday Ostomy pouch emptied and vented if applicable NA All implantable devices assessed, documented and approved NA Intravenous access site secured and place NA Valuables secured Linens and cotton and cotton/polyester blend (less than 51% polyester) Personal oil-based products / skin lotions / body lotions removed NA Wigs or hairpieces removed NA Smoking or tobacco materials removed NA Books / newspapers / magazines / loose paper removed NA Cologne, aftershave, perfume and deodorant removed Jewelry removed (may wrap wedding band) Make-up removed NA Hair care products removed NA Battery operated devices (external) removed NA Heating patches and chemical warmers removed NA Titanium eyewear removed Nail polish cured greater than 10 hours NA Casting material cured greater than 10 hours NA Hearing aids removed NA Loose dentures or partials removed NA Prosthetics have been removed NA Patient demonstrates correct use of air break device (if applicable) Patient concerns have been addressed Patient grounding bracelet on and cord attached to chamber Specifics for Inpatients (complete in addition to above) Medication sheet sent with patient Intravenous medications needed or  due during therapy sent with patient Drainage tubes (e.g. nasogastric tube or chest tube secured and vented) Endotracheal or Tracheotomy tube secured Cuff deflated of air and inflated with saline Airway suctioned Electronic  Signature(s) Signed: 09/06/2020 5:52:00 PM By: Deon Pilling Entered By: Deon Pilling on 09/06/2020 11:01:29

## 2020-09-06 NOTE — Progress Notes (Signed)
[name], [name] ([id number]) Visit Report for 09/01/2020 SuperBill Details Patient Name: Date of Service: [name], [name] 09/01/2020 Medical Record Number: [id number] Patient Account Number: [id number] Date of Birth/Sex: Treating RN: 1950/12/19 (70 y.o. [name] Primary Care Provider: [name], [place] Other Clinician: Referring Provider: Treating Provider/Extender: Levin Bacon, [name] Weeks in Treatment: 3 Diagnosis Coding ICD-10 Codes Code Description N30.41 Irradiation cystitis with hematuria Z85.46 Personal history of malignant neoplasm of prostate Facility Procedures CPT4 Code Description Modifier Quantity 33354562 G0277-(Facility Use Only) HBOT full body chamber, 68min , 4 Physician Procedures Quantity CPT4 Code Description Modifier 5638937 34287 - WC PHYS HYPERBARIC OXYGEN THERAPY 1 ICD-10 Diagnosis Description N30.41 Irradiation cystitis with hematuria Z85.46 Personal history of malignant neoplasm of prostate Electronic Signature(s) Signed: 09/05/2020 5:56:30 PM By: Levan Hurst RN, BSN Signed: 09/06/2020 9:53:33 AM By: Kalman Shan DO Entered By: Levan Hurst on 09/01/2020 13:10:01

## 2020-09-06 NOTE — Progress Notes (Signed)
MIKEN, STECHER ([id number]) Visit Report for 09/06/2020 SuperBill Details Patient Name: Date of Service: [name], [name] 09/06/2020 Medical Record Number: [id number] Patient Account Number: [id number] Date of Birth/Sex: Treating RN: 10/05/1950 (70 y.o. [name] Primary Care Provider: [name], [place] Other Clinician: Referring Provider: Treating Provider/Extender: Doyle Askew, [name] Weeks in Treatment: 4 Diagnosis Coding ICD-10 Codes Code Description N30.41 Irradiation cystitis with hematuria Z85.46 Personal history of malignant neoplasm of prostate Facility Procedures CPT4 Code Description Modifier Quantity 84665993 G0277-(Facility Use Only) HBOT full body chamber, 86min , 4 Physician Procedures Quantity CPT4 Code Description Modifier 5701779 39030 - WC PHYS HYPERBARIC OXYGEN THERAPY 1 ICD-10 Diagnosis Description N30.41 Irradiation cystitis with hematuria Electronic Signature(s) Signed: 09/06/2020 5:35:00 PM By: Worthy Keeler PA-C Signed: 09/06/2020 5:52:00 PM By: Deon Pilling Entered By: Deon Pilling on 09/06/2020 11:02:20

## 2020-09-06 NOTE — Progress Notes (Signed)
KADAN, MILLSTEIN ([id number]) Visit Report for 08/28/2020 SuperBill Details Patient Name: Date of Service: [name], [name] 08/28/2020 Medical Record Number: [id number] Patient Account Number: [id number] Date of Birth/Sex: Treating RN: 21-Sep-1950 (70 y.o. [name] Primary Care Provider: [name], [place] Other Clinician: Referring Provider: Treating Provider/Extender: Levin Bacon, [name] Weeks in Treatment: 3 Diagnosis Coding ICD-10 Codes Code Description N30.41 Irradiation cystitis with hematuria Z85.46 Personal history of malignant neoplasm of prostate Facility Procedures CPT4 Code Description Modifier Quantity 15379432 G0277-(Facility Use Only) HBOT full body chamber, 45min , 4 Physician Procedures Quantity CPT4 Code Description Modifier [id number] 29574 - WC PHYS HYPERBARIC OXYGEN THERAPY 1 ICD-10 Diagnosis Description N30.41 Irradiation cystitis with hematuria Z85.46 Personal history of malignant neoplasm of prostate Electronic Signature(s) Signed: 08/28/2020 6:20:16 PM By: Deon Pilling Signed: 09/06/2020 9:55:24 AM By: Kalman Shan DO Entered By: Deon Pilling on 08/28/2020 11:02:37

## 2020-09-06 NOTE — Progress Notes (Signed)
KNOX, CERVI ([id number]) Visit Report for 08/28/2020 HBO Details Patient Name: Date of Service: [name], [name] 08/28/2020 8:00 A M Medical Record Number: [id number] Patient Account Number: [id number] Date of Birth/Sex: Treating RN: 1950-07-12 (70 y.o. [name] Primary Care Alanzo Lamb: [name], [place] Other Clinician: Referring Kaeleigh Westendorf: Treating Tammee Thielke/Extender: Levin Bacon, [name] Weeks in Treatment: 3 HBO Treatment Course Details Treatment Course Number: 1 Ordering Fynley Chrystal: Linton Ham T Treatments Ordered: otal 40 HBO Treatment Start Date: 08/14/2020 HBO Indication: Late Effect of Radiation Notes: Radiation Cystitis HBO Treatment Details Treatment Number: 11 Patient Type: Outpatient Chamber Type: Monoplace Chamber Serial #: [id number] Treatment Protocol: 2.5 ATA with 90 minutes oxygen, with two 5 minute air breaks Treatment Details Compression Rate Down: 2.0 psi / minute De-Compression Rate Up: 2.0 psi / minute A breaks and breathing ir Compress Tx Pressure periods Decompress Decompress Begins Reached (leave unused spaces Begins Ends blank) Chamber Pressure (ATA 1 2.5 2.5 2.5 2.5 2.5 - - 2.5 1 ) Clock Time (24 hr) 08:17 08:30 09:00 09:05 09:35 09:40 - - 10:10 10:20 Treatment Length: 123 (minutes) Treatment Segments: 4 Vital Signs Capillary Blood Glucose Reference Range: 80 - 120 mg / dl HBO Diabetic Blood Glucose Intervention Range: <131 mg/dl or >249 mg/dl Type: Time Vitals Blood Respiratory Capillary Blood Glucose Pulse Action Pulse: Temperature: Taken: Pressure: Rate: Glucose (mg/dl): Meter #: Oximetry (%) Taken: Pre 07:53 104/43 72 18 98 137 glucerna provided. Post 10:23 129/53 72 16 97.6 194 Treatment Response Treatment Toleration: Well Treatment Completion Status: Treatment Completed without Adverse Event Electronic Signature(s) Signed: 08/28/2020 6:20:16 PM By: Deon Pilling Signed: 09/06/2020 9:55:24 AM By: Kalman Shan DO Entered  By: Deon Pilling on 08/28/2020 11:02:26 -------------------------------------------------------------------------------- HBO Safety Checklist Details Patient Name: Date of Service: [name], [place] 08/28/2020 8:00 A M Medical Record Number: [id number] Patient Account Number: [id number] Date of Birth/Sex: Treating RN: [birth year] (70 y.o. [name] Primary Care Wendy Hoback: [name], [place] Other Clinician: Referring Briyanna Billingham: Treating Keysha Damewood/Extender: Levin Bacon, [name] Weeks in Treatment: 3 HBO Safety Checklist Items Safety Checklist Consent Form Signed Patient voided / foley secured and emptied When did you last eato this morning 2 boiled eggs, toast, coffee Last dose of injectable or oral agent on Fridays Ostomy pouch emptied and vented if applicable NA All implantable devices assessed, documented and approved NA Intravenous access site secured and place NA Valuables secured Linens and cotton and cotton/polyester blend (less than 51% polyester) Personal oil-based products / skin lotions / body lotions removed NA Wigs or hairpieces removed NA Smoking or tobacco materials removed NA Books / newspapers / magazines / loose paper removed NA Cologne, aftershave, perfume and deodorant removed Jewelry removed (may wrap wedding band) Make-up removed NA Hair care products removed NA Battery operated devices (external) removed NA Heating patches and chemical warmers removed NA Titanium eyewear removed Nail polish cured greater than 10 hours NA Casting material cured greater than 10 hours NA Hearing aids removed NA Loose dentures or partials removed NA Prosthetics have been removed NA Patient demonstrates correct use of air break device (if applicable) Patient concerns have been addressed Patient grounding bracelet on and cord attached to chamber Specifics for Inpatients (complete in addition to above) Medication sheet sent with patient Intravenous  medications needed or due during therapy sent with patient Drainage tubes (e.g. nasogastric tube or chest tube secured and vented) Endotracheal or Tracheotomy tube secured Cuff deflated of air and inflated with saline Airway suctioned Electronic Signature(s)  Signed: 08/28/2020 6:20:16 PM By: Deon Pilling Entered By: Deon Pilling on 08/28/2020 11:01:28

## 2020-09-06 NOTE — Progress Notes (Signed)
[name] ([id number]) Visit Report for 08/31/2020 HBO Details Patient Name: Date of Service: [name] 08/31/2020 8:00 A M Medical Record Number: [id number] Patient Account Number: [id number] Date of Birth/Sex: Treating RN: 05/05/1950 (70 y.o. [name] Primary Care [name]: [name], [place] TA LIE Other Clinician: Referring Jarin Cornfield: Treating Aleksi Brummet/Extender: Levin Bacon, [name] Weeks in Treatment: 3 HBO Treatment Course Details Treatment Course Number: 1 Ordering Zayaan Kozak: Linton Ham T Treatments Ordered: otal 40 HBO Treatment Start Date: 08/14/2020 HBO Indication: Late Effect of Radiation Notes: Radiation Cystitis HBO Treatment Details Treatment Number: 14 Patient Type: Outpatient Chamber Type: Monoplace Chamber Serial #: [id number] Treatment Protocol: 2.5 ATA with 90 minutes oxygen, with two 5 minute air breaks Treatment Details Compression Rate Down: 2.0 psi / minute De-Compression Rate Up: 2.0 psi / minute A breaks and breathing ir Compress Tx Pressure periods Decompress Decompress Begins Reached (leave unused spaces Begins Ends blank) Chamber Pressure (ATA 1 2.5 2.5 2.5 2.5 2.5 - - 2.5 1 ) Clock Time (24 hr) 08:09 08:22 08:52 08:57 09:27 09:32 - - 10:02 10:14 Treatment Length: 125 (minutes) Treatment Segments: 4 Vital Signs Capillary Blood Glucose Reference Range: 80 - 120 mg / dl HBO Diabetic Blood Glucose Intervention Range: <131 mg/dl or >249 mg/dl Time Vitals Blood Respiratory Capillary Blood Glucose Pulse Action Type: Pulse: Temperature: Taken: Pressure: Rate: Glucose (mg/dl): Meter #: Oximetry (%) Taken: Pre 07:42 103/47 71 16 97.9 149 Post 10:14 127/52 67 16 97.5 174 Treatment Response Treatment Completion Status: Treatment Completed without Adverse Event Electronic Signature(s) Signed: 09/05/2020 5:56:30 PM By: Levan Hurst RN, BSN Signed: 09/06/2020 9:53:33 AM By: Kalman Shan DO Entered By: Levan Hurst on 08/31/2020  10:59:14 -------------------------------------------------------------------------------- HBO Safety Checklist Details Patient Name: Date of Service: [name], [name] 08/31/2020 8:00 A M Medical Record Number: [id number] Patient Account Number: [id number] Date of Birth/Sex: Treating RN: [birth year] (70 y.o. [name] Primary Care [name]: [name], [place] Other Clinician: Referring [name]: Treating [name]/Extender: Levin Bacon, [name] Weeks in Treatment: 3 HBO Safety Checklist Items Safety Checklist Consent Form Signed Patient voided / foley secured and emptied When did you last eato 0700 - ham and cheese omelet Last dose of injectable or oral agent na Ostomy pouch emptied and vented if applicable NA All implantable devices assessed, documented and approved NA Intravenous access site secured and place NA Valuables secured Linens and cotton and cotton/polyester blend (less than 51% polyester) Personal oil-based products / skin lotions / body lotions removed Wigs or hairpieces removed Smoking or tobacco materials removed Books / newspapers / magazines / loose paper removed Cologne, aftershave, perfume and deodorant removed Jewelry removed (may wrap wedding band) Make-up removed Hair care products removed Battery operated devices (external) removed Heating patches and chemical warmers removed Titanium eyewear removed Nail polish cured greater than 10 hours NA Casting material cured greater than 10 hours NA Hearing aids removed NA Loose dentures or partials removed NA Prosthetics have been removed Patient demonstrates correct use of air break device (if applicable) Patient concerns have been addressed Patient grounding bracelet on and cord attached to chamber Specifics for Inpatients (complete in addition to above) Medication sheet sent with patient NA Intravenous medications needed or due during therapy sent with patient NA Drainage tubes (e.g.  nasogastric tube or chest tube secured and vented) NA Endotracheal or Tracheotomy tube secured NA Cuff deflated of air and inflated with saline NA Airway suctioned NA Electronic Signature(s) Signed: 09/05/2020 5:56:30 PM By: Levan Hurst RN,  BSN Entered By: Levan Hurst on 08/31/2020 08:51:01

## 2020-09-06 NOTE — Progress Notes (Signed)
[name] ([id number]) Visit Report for 09/06/2020 Arrival Information Details Patient Name: Date of Service: [name], [name] 09/06/2020 8:00 A M Medical Record Number: [id number] Patient Account Number: [id number] Date of Birth/Sex: Treating RN: [name] (70 y.o. [name], [name] Primary Care [name]: [name], [place] Other Clinician: Referring [name]: Treating [name]/Extender: [name], NA [name] in Treatment: 4 Visit Information History Since Last Visit Added or deleted any medications: No Patient Arrived: [place] Any new allergies or adverse reactions: No Arrival Time: 07:45 Had a fall or experienced change in No Accompanied By: self activities of daily living that Jon affect Transfer Assistance: None risk of falls: Patient Identification Verified: Yes Signs or symptoms of abuse/neglect since last visito No Secondary Verification Process Completed: Yes Hospitalized since last visit: No Patient Requires Transmission-Based Precautions: No Implantable device outside of the clinic excluding No Patient Has Alerts: Yes cellular tissue based products placed in the center Patient Alerts: BP Right Arm Only since last visit: Pain Present Now: No Electronic Signature(s) Signed: 09/06/2020 5:52:00 PM By: [name] Entered By: [name] on 09/06/2020 10:59:16 -------------------------------------------------------------------------------- Encounter Discharge Information Details Patient Name: Date of Service: [name], [name] 09/06/2020 8:00 A M Medical Record Number: [id number] Patient Account Number: [id number] Date of Birth/Sex: Treating RN: [name], [name] (70 y.o. [name] Primary Care Latasia Silberstein: [name], [place] Other Clinician: Referring Shaheen Mende: Treating Silverio Hagan/Extender: [name], NA [name] in Treatment: 4 Encounter Discharge Information Items Discharge Condition: Stable Ambulatory Status: [place] Discharge Destination:  Home Transportation: Private Auto Accompanied By: self Schedule Follow-up Appointment: Yes Clinical Summary of Care: Electronic Signature(s) Signed: 09/06/2020 5:52:00 PM By: [name] Entered By: [name] on 09/06/2020 11:02:51 -------------------------------------------------------------------------------- Vitals Details Patient Name: Date of Service: [name], [name] 09/06/2020 8:00 A M Medical Record Number: [id number] Patient Account Number: [id number] Date of Birth/Sex: Treating RN: [birth year] [name] (70 y.o. [name] Primary Care Rajendra Spiller: [name], [place] Other Clinician: Referring Davin Archuletta: Treating Konstantin Lehnen/Extender: [name], NA [name] in Treatment: 4 Vital Signs Time Taken: 07:45 Temperature (F): 97.9 Height (in): 70 Pulse (bpm): 69 Weight (lbs): 195 Respiratory Rate (breaths/min): 18 Body Mass Index (BMI): 28 Blood Pressure (mmHg): 110/48 Capillary Blood Glucose (mg/dl): 200 Reference Range: 80 - 120 mg / dl Electronic Signature(s) Signed: 09/06/2020 5:52:00 PM By: [name] Entered By: [name] on 09/06/2020 10:59:43

## 2020-09-06 NOTE — Progress Notes (Signed)
[name], [name] ([id number]) Visit Report for 08/30/2020 HBO Details Patient Name: Date of Service: [name], [name] 08/30/2020 8:00 A M Medical Record Number: [id number] Patient Account Number: [id number] Date of Birth/Sex: Treating RN: January 02, 1951 (70 y.o. [name] Primary Care Bronx Brogden: [name], [place] Other Clinician: Referring Ladislaus Repsher: Treating Taivon Haroon/Extender: Levin Bacon, [name] Weeks in Treatment: 3 HBO Treatment Course Details Treatment Course Number: 1 Ordering Romani Wilbon: Linton Ham T Treatments Ordered: otal 40 HBO Treatment Start Date: 08/14/2020 HBO Indication: Late Effect of Radiation Notes: Radiation Cystitis HBO Treatment Details Treatment Number: 13 Patient Type: Outpatient Chamber Type: Monoplace Chamber Serial #: [id number] Treatment Protocol: 2.5 ATA with 90 minutes oxygen, with two 5 minute air breaks Treatment Details Compression Rate Down: 2.0 psi / minute De-Compression Rate Up: 2.0 psi / minute A breaks and breathing ir Compress Tx Pressure periods Decompress Decompress Begins Reached (leave unused spaces Begins Ends blank) Chamber Pressure (ATA 1 2.5 2.5 2.5 2.5 2.5 - - 2.5 1 ) Clock Time (24 hr) 08:14 08:28 08:58 09:03 09:33 09:38 - - 10:08 10:16 Treatment Length: 122 (minutes) Treatment Segments: 4 Vital Signs Capillary Blood Glucose Reference Range: 80 - 120 mg / dl HBO Diabetic Blood Glucose Intervention Range: <131 mg/dl or >249 mg/dl Time Vitals Blood Respiratory Capillary Blood Glucose Pulse Action Type: Pulse: Temperature: Taken: Pressure: Rate: Glucose (mg/dl): Meter #: Oximetry (%) Taken: Pre 07:45 102/45 70 16 98.1 175 Post 10:17 135/56 67 18 97.9 177 Treatment Response Treatment Toleration: Well Treatment Completion Status: Treatment Completed without Adverse Event Electronic Signature(s) Signed: 08/30/2020 4:06:25 PM By: Deon Pilling Signed: 09/06/2020 9:55:24 AM By: Kalman Shan DO Entered By: Deon Pilling  on 08/30/2020 10:51:04 -------------------------------------------------------------------------------- HBO Safety Checklist Details Patient Name: Date of Service: [name], [name] 08/30/2020 8:00 A M Medical Record Number: [id number] Patient Account Number: [id number] Date of Birth/Sex: Treating RN: 09/07/1950 (70 y.o. [name] Primary Care Navid Lenzen: [name], [place] TA LIE Other Clinician: Referring Hadlee Burback: Treating Messiah Rovira/Extender: [name], [name] Weeks in Treatment: 3 HBO Safety Checklist Items Safety Checklist Consent Form Signed Patient voided / foley secured and emptied When did you last eato this morning 2 boiled eggs, toast, coffee Last dose of injectable or oral agent weekly on Friday Ostomy pouch emptied and vented if applicable NA All implantable devices assessed, documented and approved Intravenous access site secured and place NA Valuables secured Linens and cotton and cotton/polyester blend (less than 51% polyester) Personal oil-based products / skin lotions / body lotions removed Wigs or hairpieces removed Smoking or tobacco materials removed NA Books / newspapers / magazines / loose paper removed NA Cologne, aftershave, perfume and deodorant removed Jewelry removed (may wrap wedding band) Make-up removed NA Hair care products removed NA Battery operated devices (external) removed NA Heating patches and chemical warmers removed NA Titanium eyewear removed Nail polish cured greater than 10 hours NA Casting material cured greater than 10 hours NA Hearing aids removed NA Loose dentures or partials removed NA Prosthetics have been removed NA Patient demonstrates correct use of air break device (if applicable) Patient concerns have been addressed Patient grounding bracelet on and cord attached to chamber Specifics for Inpatients (complete in addition to above) Medication sheet sent with patient Intravenous medications needed or due during therapy  sent with patient Drainage tubes (e.g. nasogastric tube or chest tube secured and vented) Endotracheal or Tracheotomy tube secured Cuff deflated of air and inflated with saline Airway suctioned Electronic Signature(s) Signed: 08/30/2020 4:06:25 PM By: Rolin Barry,  Bobbi Entered By: Deon Pilling on 08/30/2020 10:50:21

## 2020-09-06 NOTE — Progress Notes (Signed)
[name] ([id number]) Visit Report for 08/29/2020 HBO Details Patient Name: Date of Service: [name], [name] 08/29/2020 8:00 A M Medical Record Number: [id number] Patient Account Number: [id number] Date of Birth/Sex: Treating RN: 1951-03-12 (70 y.o. [name] Primary Care [name]: [name], [place] Other Clinician: Referring [name]: Treating [name]: [name], [name] Weeks in Treatment: 3 HBO Treatment Course Details Treatment Course Number: 1 Ordering [name]: [name] T Treatments Ordered: otal 40 HBO Treatment Start Date: 08/14/2020 HBO Indication: Late Effect of Radiation Notes: Radiation Cystitis HBO Treatment Details Treatment Number: 12 Patient Type: Outpatient Chamber Type: Monoplace Chamber Serial #: [id number] Treatment Protocol: 2.5 ATA with 90 minutes oxygen, with two 5 minute air breaks Treatment Details Compression Rate Down: 2.0 psi / minute De-Compression Rate Up: 2.0 psi / minute A breaks and breathing ir Compress Tx Pressure periods Decompress Decompress Begins Reached (leave unused spaces Begins Ends blank) Chamber Pressure (ATA 1 2.5 2.5 2.5 2.5 2.5 - - 2.5 1 ) Clock Time (24 hr) 08:04 08:19 08:49 08:54 09:24 09:29 - - 09:59 10:09 Treatment Length: 125 (minutes) Treatment Segments: 4 Vital Signs Capillary Blood Glucose Reference Range: 80 - 120 mg / dl HBO Diabetic Blood Glucose Intervention Range: <131 mg/dl or >249 mg/dl Type: Time Vitals Blood Respiratory Capillary Blood Glucose Pulse Action Pulse: Temperature: Taken: Pressure: Rate: Glucose (mg/dl): Meter #: Oximetry (%) Taken: Pre 07:50 137/52 75 16 97.7 139 glucerna provided. Post 10:10 147/69 73 16 97.5 178 Treatment Response Treatment Toleration: Well Treatment Completion Status: Treatment Completed without Adverse Event Electronic Signature(s) Signed: 08/29/2020 5:25:37 PM By: [name] Signed: 09/06/2020 9:55:24 AM By: Jon Shan DO Entered  By: [name] on 08/29/2020 10:38:07 -------------------------------------------------------------------------------- HBO Safety Checklist Details Patient Name: Date of Service: [name], [name] 08/29/2020 8:00 A M Medical Record Number: [id number] Patient Account Number: [id number] Date of Birth/Sex: Treating RN: 1950-09-21 (70 y.o. [name] Primary Care [name]: [name], [place] TA LIE Other Clinician: Referring [name]: Treating [name]/Extender: [name], [name] Weeks in Treatment: 3 HBO Safety Checklist Items Safety Checklist Consent Form Signed Patient voided / foley secured and emptied When did you last eato this morning [name] and cheese omelet Last dose of injectable or oral agent weekly on Friday Ostomy pouch emptied and vented if applicable NA All implantable devices assessed, documented and approved Intravenous access site secured and place NA Valuables secured Linens and cotton and cotton/polyester blend (less than 51% polyester) Personal oil-based products / skin lotions / body lotions removed Wigs or hairpieces removed NA Smoking or tobacco materials removed NA Books / newspapers / magazines / loose paper removed NA Cologne, aftershave, perfume and deodorant removed Jewelry removed (may wrap wedding band) Make-up removed NA Hair care products removed NA Battery operated devices (external) removed NA Heating patches and chemical warmers removed NA Titanium eyewear removed Nail polish cured greater than 10 hours NA Casting material cured greater than 10 hours NA Hearing aids removed NA Loose dentures or partials removed NA Prosthetics have been removed NA Patient demonstrates correct use of air break device (if applicable) Patient concerns have been addressed Patient grounding bracelet on and cord attached to chamber Specifics for Inpatients (complete in addition to above) Medication sheet sent with patient Intravenous medications needed or due  during therapy sent with patient Drainage tubes (e.g. nasogastric tube or chest tube secured and vented) Endotracheal or Tracheotomy tube secured Cuff deflated of air and inflated with saline Airway suctioned Electronic Signature(s) Signed: 08/29/2020 5:25:37 PM  By: [name] Entered ByDeon [name] on 08/29/2020 10:37:23

## 2020-09-06 NOTE — Progress Notes (Signed)
[name], [name] ([id number]) Visit Report for 08/31/2020 SuperBill Details Patient Name: Date of Service: [name], [name] 08/31/2020 Medical Record Number: [id number] Patient Account Number: [id number] Date of Birth/Sex: Treating RN: [birth year] (70 y.o. [name] Primary Care Provider: [name], [place] Other Clinician: Referring Provider: Treating Provider/Extender: Levin Bacon, [name] Weeks in Treatment: 3 Diagnosis Coding ICD-10 Codes Code Description N30.41 Irradiation cystitis with hematuria Z85.46 Personal history of malignant neoplasm of prostate Facility Procedures CPT4 Code Description Modifier Quantity [id number] G0277-(Facility Use Only) HBOT full body chamber, 22min , 4 Physician Procedures Quantity CPT4 Code Description Modifier [id number] 41712 - WC PHYS HYPERBARIC OXYGEN THERAPY 1 ICD-10 Diagnosis Description N30.41 Irradiation cystitis with hematuria Z85.46 Personal history of malignant neoplasm of prostate Electronic Signature(s) Signed: 09/05/2020 5:56:30 PM By: Levan Hurst RN, BSN Signed: 09/06/2020 9:53:33 AM By: Kalman Shan DO Entered By: Levan Hurst on 08/31/2020 10:59:25

## 2020-09-06 NOTE — Progress Notes (Signed)
REMO, KIRSCHENMANN ([id number]) Visit Report for 09/01/2020 HBO Details Patient Name: Date of Service: [name], [name] 09/01/2020 8:00 A M Medical Record Number: [id number] Patient Account Number: [id number] Date of Birth/Sex: Treating RN: [birth year] (70 y.o. [name] Primary Care Jose Alleyne: [name], [place] TA LIE Other Clinician: Referring Saundra Gin: Treating Donyell Carrell/Extender: Levin Bacon, [name] Weeks in Treatment: 3 HBO Treatment Course Details Treatment Course Number: 1 Ordering Zeva Leber: Linton Ham T Treatments Ordered: otal 40 HBO Treatment Start Date: 08/14/2020 HBO Indication: Late Effect of Radiation Notes: Radiation Cystitis HBO Treatment Details Treatment Number: 15 Patient Type: Outpatient Chamber Type: Monoplace Chamber Serial #: [id number] Treatment Protocol: 2.5 ATA with 90 minutes oxygen, with two 5 minute air breaks Treatment Details Compression Rate Down: 2.0 psi / minute De-Compression Rate Up: 2.5 psi / minute A breaks and breathing ir Compress Tx Pressure periods Decompress Decompress Begins Reached (leave unused spaces Begins Ends blank) Chamber Pressure (ATA 1 2.5 2.5 2.5 2.5 2.5 - - 2.5 1 ) Clock Time (24 hr) 08:09 08:22 08:52 08:57 09:27 09:32 - - 10:02 10:12 Treatment Length: 123 (minutes) Treatment Segments: 4 Vital Signs Capillary Blood Glucose Reference Range: 80 - 120 mg / dl HBO Diabetic Blood Glucose Intervention Range: <131 mg/dl or >249 mg/dl Time Vitals Blood Respiratory Capillary Blood Glucose Pulse Action Type: Pulse: Temperature: Taken: Pressure: Rate: Glucose (mg/dl): Meter #: Oximetry (%) Taken: Pre 07:45 111/46 72 16 98.5 174 Post 10:12 127/46 68 16 97.8 145 Treatment Response Treatment Completion Status: Treatment Completed without Adverse Event Electronic Signature(s) Signed: 09/05/2020 5:56:30 PM By: Levan Hurst RN, BSN Signed: 09/06/2020 9:53:33 AM By: Kalman Shan DO Entered By: Levan Hurst on 09/01/2020  13:09:55 -------------------------------------------------------------------------------- HBO Safety Checklist Details Patient Name: Date of Service: [name], [place] 09/01/2020 8:00 A M Medical Record Number: [id number] Patient Account Number: [id number] Date of Birth/Sex: Treating RN: [birth year] (70 y.o. [name] Primary Care Jeffey Janssen: [name], [place] Other Clinician: Referring Kieren Ricci: Treating Locklyn Henriquez/Extender: Levin Bacon, [name] Weeks in Treatment: 3 HBO Safety Checklist Items Safety Checklist Consent Form Signed Patient voided / foley secured and emptied When did you last eato 0645 - ham and cheese sandwich Last dose of injectable or oral agent na Ostomy pouch emptied and vented if applicable NA All implantable devices assessed, documented and approved NA Intravenous access site secured and place NA Valuables secured Linens and cotton and cotton/polyester blend (less than 51% polyester) Personal oil-based products / skin lotions / body lotions removed Wigs or hairpieces removed Smoking or tobacco materials removed Books / newspapers / magazines / loose paper removed Cologne, aftershave, perfume and deodorant removed Jewelry removed (may wrap wedding band) Make-up removed Hair care products removed Battery operated devices (external) removed Heating patches and chemical warmers removed Titanium eyewear removed Nail polish cured greater than 10 hours NA Casting material cured greater than 10 hours NA Hearing aids removed NA Loose dentures or partials removed NA Prosthetics have been removed Patient demonstrates correct use of air break device (if applicable) Patient concerns have been addressed Patient grounding bracelet on and cord attached to chamber Specifics for Inpatients (complete in addition to above) Medication sheet sent with patient NA Intravenous medications needed or due during therapy sent with patient NA Drainage tubes (e.g.  nasogastric tube or chest tube secured and vented) NA Endotracheal or Tracheotomy tube secured NA Cuff deflated of air and inflated with saline NA Airway suctioned NA Electronic Signature(s) Signed: 09/05/2020 5:56:30 PM By: Levan Hurst RN,  BSN Entered By: Levan Hurst on 09/01/2020 10:12:07

## 2020-09-07 ENCOUNTER — Encounter (HOSPITAL_BASED_OUTPATIENT_CLINIC_OR_DEPARTMENT_OTHER): Payer: Medicare Other | Admitting: Internal Medicine

## 2020-09-07 ENCOUNTER — Other Ambulatory Visit: Payer: Self-pay

## 2020-09-07 DIAGNOSIS — N3041 Irradiation cystitis with hematuria: Secondary | ICD-10-CM | POA: Diagnosis not present

## 2020-09-07 LAB — GLUCOSE, CAPILLARY
Glucose-Capillary: 190 mg/dL — ABNORMAL HIGH (ref 70–99)
Glucose-Capillary: 139 mg/dL — ABNORMAL HIGH (ref 70–99)

## 2020-09-08 ENCOUNTER — Encounter (HOSPITAL_BASED_OUTPATIENT_CLINIC_OR_DEPARTMENT_OTHER): Payer: Medicare Other | Admitting: Internal Medicine

## 2020-09-11 ENCOUNTER — Encounter (HOSPITAL_BASED_OUTPATIENT_CLINIC_OR_DEPARTMENT_OTHER): Payer: Medicare Other | Admitting: Internal Medicine

## 2020-09-11 ENCOUNTER — Other Ambulatory Visit: Payer: Self-pay

## 2020-09-11 DIAGNOSIS — N3041 Irradiation cystitis with hematuria: Secondary | ICD-10-CM | POA: Diagnosis not present

## 2020-09-11 LAB — GLUCOSE, CAPILLARY
Glucose-Capillary: 164 mg/dL — ABNORMAL HIGH (ref 70–99)
Glucose-Capillary: 233 mg/dL — ABNORMAL HIGH (ref 70–99)

## 2020-09-11 NOTE — Progress Notes (Signed)
AUDRIC, VENN ([id number]) Visit Report for 09/11/2020 Arrival Information Details Patient Name: Date of Service: GREYSIN, MEDLEN 09/11/2020 8:00 A M Medical Record Number: [id number] Patient Account Number: [id number] Date of Birth/Sex: Treating RN: Nov 29, 1950 (70 y.o. [name] Primary Care Pranit Owensby: [name], [place] Other Clinician: Referring Rangel Echeverri: Treating Oliviarose Punch/Extender: [name], [name] Weeks in Treatment: 5 Visit Information History Since Last Visit Added or deleted any medications: No Patient Arrived: [name] Any new allergies or adverse reactions: No Arrival Time: 07:45 Had a fall or experienced change in No Accompanied By: self activities of daily living that may affect Transfer Assistance: None risk of falls: Patient Identification Verified: Yes Signs or symptoms of abuse/neglect since last visito No Secondary Verification Process Completed: Yes Hospitalized since last visit: No Patient Requires Transmission-Based Precautions: No Implantable device outside of the clinic excluding No Patient Has Alerts: Yes cellular tissue based products placed in the center Patient Alerts: BP Right Arm Only since last visit: Pain Present Now: No Notes Seen [facility] on Friday related to blood in urine. Per patient Urology placed a catheter. Electronic Signature(s) Signed: 09/11/2020 5:44:33 PM By: Deon Pilling Entered By: Deon Pilling on 09/11/2020 10:54:45 -------------------------------------------------------------------------------- Encounter Discharge Information Details Patient Name: Date of Service: [name], [place] 09/11/2020 8:00 A M Medical Record Number: [id number] Patient Account Number: [id number] Date of Birth/Sex: Treating RN: Sep 07, 1950 (70 y.o. [name] Primary Care Leandre Wien: [name], [place] Other Clinician: Referring Trenda Corliss: Treating Anees Vanecek/Extender: [name], [name] Weeks in Treatment: 5 Encounter Discharge  Information Items Discharge Condition: Stable Ambulatory Status: Walker Discharge Destination: Home Transportation: Private Auto Accompanied By: self Schedule Follow-up Appointment: Yes Clinical Summary of Care: Electronic Signature(s) Signed: 09/11/2020 5:44:33 PM By: Deon Pilling Entered By: Deon Pilling on 09/11/2020 10:57:22 -------------------------------------------------------------------------------- Vitals Details Patient Name: Date of Service: [name], [place] 09/11/2020 8:00 A M Medical Record Number: [id number] Patient Account Number: [id number] Date of Birth/Sex: Treating RN: 26-Nov-1950 (70 y.o. [name] Primary Care Teea Ducey: [name], [place] Other Clinician: Referring Lewie Deman: Treating Tage Feggins/Extender: [name], [name] Weeks in Treatment: 5 Vital Signs Time Taken: 07:45 Temperature (F): 98.1 Height (in): 70 Pulse (bpm): 73 Weight (lbs): 195 Respiratory Rate (breaths/min): 18 Body Mass Index (BMI): 28 Blood Pressure (mmHg): 102/42 Capillary Blood Glucose (mg/dl): 164 Reference Range: 80 - 120 mg / dl Electronic Signature(s) Signed: 09/11/2020 5:44:33 PM By: Deon Pilling Entered By: Deon Pilling on 09/11/2020 10:55:01

## 2020-09-11 NOTE — Progress Notes (Signed)
Jon, [name] ([id number]) Visit Report for 09/11/2020 HBO Details Patient Name: Date of Service: [name], [name] 09/11/2020 8:00 A M Medical Record Number: [id number] Patient Account Number: [id number] Date of Birth/Sex: Treating RN: Aug 11, 1950 (70 y.o. [name] Primary Care Braydan Marriott: [name], [place] Other Clinician: Referring Tyron Manetta: Treating Veasna Santibanez/Extender: [name], [name] Weeks in Treatment: 5 HBO Treatment Course Details Treatment Course Number: 1 Ordering Kadar Chance: Linton Ham T Treatments Ordered: otal 40 HBO Treatment Start Date: 08/14/2020 HBO Indication: Late Effect of Radiation Notes: Radiation Cystitis HBO Treatment Details Treatment Number: 19 Patient Type: Outpatient Chamber Type: Monoplace Chamber Serial #: [id number] Treatment Protocol: 2.5 ATA with 90 minutes oxygen, with two 5 minute air breaks Treatment Details Compression Rate Down: 2.0 psi / minute De-Compression Rate Up: 2.5 psi / minute A breaks and breathing ir Compress Tx Pressure periods Decompress Decompress Begins Reached (leave unused spaces Begins Ends blank) Chamber Pressure (ATA 1 2.5 2.5 2.5 2.5 2.5 - - 2.5 1 ) Clock Time (24 hr) 08:01 08:14 08:44 08:49 09:19 09:24 - - 09:54 10:04 Treatment Length: 123 (minutes) Treatment Segments: 4 Vital Signs Capillary Blood Glucose Reference Range: 80 - 120 mg / dl HBO Diabetic Blood Glucose Intervention Range: <131 mg/dl or >249 mg/dl Time Vitals Blood Respiratory Capillary Blood Glucose Pulse Action Type: Pulse: Temperature: Taken: Pressure: Rate: Glucose (mg/dl): Meter #: Oximetry (%) Taken: Pre 07:45 102/42 73 18 98.1 164 Post 10:05 124/42 67 16 97.7 233 Treatment Response Treatment Toleration: Well Treatment Completion Status: Treatment Completed without Adverse Event Wynton Hufstetler Notes The patient arrives in clinic today with a Foley catheter. He went to see Dr. [name] of urology on Friday. Noted for history of  hemorrhagic radiation cystitis with several trips to the OR for fulguration. He is also had TURP. He noted that he completed 17 sessions of HBO and was clear for a period of time but within the 3 or 4 days prior to this he developed hematuria with clots. He had a Foley catheter placed. The Foley catheter was irrigated to clear. He tolerated today's treatment well Electronic Signature(s) Signed: 09/11/2020 4:50:54 PM By: Linton Ham MD Entered By: Linton Ham on 09/11/2020 11:51:46 -------------------------------------------------------------------------------- HBO Safety Checklist Details Patient Name: Date of Service: [name], [place] 09/11/2020 8:00 A M Medical Record Number: [id number] Patient Account Number: [id number] Date of Birth/Sex: Treating RN: [birth year] (70 y.o. [name] Primary Care Bushra Denman: [name], [place] Other Clinician: Referring Oletta Buehring: Treating Minh [name]/Extender: [name], [name] Weeks in Treatment: 5 HBO Safety Checklist Items Safety Checklist Consent Form Signed Patient voided / foley secured and emptied When did you last eato this morning 2 boiled eggs, toast, coffee Last dose of injectable or oral agent weekly on Friday Ostomy pouch emptied and vented if applicable NA All implantable devices assessed, documented and approved NA Intravenous access site secured and place NA Valuables secured Linens and cotton and cotton/polyester blend (less than 51% polyester) Personal oil-based products / skin lotions / body lotions removed Wigs or hairpieces removed NA Smoking or tobacco materials removed NA Books / newspapers / magazines / loose paper removed Cologne, aftershave, perfume and deodorant removed Jewelry removed (may wrap wedding band) Make-up removed NA Hair care products removed NA Battery operated devices (external) removed NA Heating patches and chemical warmers removed NA Titanium eyewear removed Nail polish cured  greater than 10 hours NA Casting material cured greater than 10 hours NA Hearing aids removed NA Loose dentures or partials  removed NA Prosthetics have been removed NA Patient demonstrates correct use of air break device (if applicable) Patient concerns have been addressed Patient grounding bracelet on and cord attached to chamber Specifics for Inpatients (complete in addition to above) Medication sheet sent with patient Intravenous medications needed or due during therapy sent with patient Drainage tubes (e.g. nasogastric tube or chest tube secured and vented) Endotracheal or Tracheotomy tube secured Cuff deflated of air and inflated with saline Airway suctioned Electronic Signature(s) Signed: 09/11/2020 5:44:33 PM By: Deon Pilling Entered By: Deon Pilling on 09/11/2020 10:55:59

## 2020-09-11 NOTE — Progress Notes (Signed)
MIKIAS, LANZ ([id number]) Visit Report for 09/11/2020 SuperBill Details Patient Name: Date of Service: CHRISTEN, BEDOYA 09/11/2020 Medical Record Number: [id number] Patient Account Number: [id number] Date of Birth/Sex: Treating RN: 05/17/1950 (70 y.o. [name] Primary Care Provider: [name], [place] Other Clinician: Referring Provider: Treating Provider/Extender: [name], [name] Weeks in Treatment: 5 Diagnosis Coding ICD-10 Codes Code Description N30.41 Irradiation cystitis with hematuria Z85.46 Personal history of malignant neoplasm of prostate Facility Procedures CPT4 Code Description Modifier Quantity [id number] G0277-(Facility Use Only) HBOT full body chamber, 37min , 4 Physician Procedures Quantity CPT4 Code Description Modifier [id number] 56433 - WC PHYS HYPERBARIC OXYGEN THERAPY 1 ICD-10 Diagnosis Description N30.41 Irradiation cystitis with hematuria Electronic Signature(s) Signed: 09/11/2020 4:50:54 PM By: Linton Ham MD Signed: 09/11/2020 5:44:33 PM By: Deon Pilling Entered By: Deon Pilling on 09/11/2020 10:57:08

## 2020-09-12 ENCOUNTER — Encounter (HOSPITAL_BASED_OUTPATIENT_CLINIC_OR_DEPARTMENT_OTHER): Payer: Medicare Other | Admitting: Internal Medicine

## 2020-09-12 ENCOUNTER — Other Ambulatory Visit: Payer: Self-pay

## 2020-09-12 DIAGNOSIS — N3041 Irradiation cystitis with hematuria: Secondary | ICD-10-CM | POA: Diagnosis not present

## 2020-09-12 LAB — GLUCOSE, CAPILLARY
Glucose-Capillary: 198 mg/dL — ABNORMAL HIGH (ref 70–99)
Glucose-Capillary: 171 mg/dL — ABNORMAL HIGH (ref 70–99)

## 2020-09-13 ENCOUNTER — Encounter (HOSPITAL_BASED_OUTPATIENT_CLINIC_OR_DEPARTMENT_OTHER): Payer: Self-pay | Admitting: *Deleted

## 2020-09-13 ENCOUNTER — Encounter (HOSPITAL_BASED_OUTPATIENT_CLINIC_OR_DEPARTMENT_OTHER): Payer: Medicare Other | Admitting: Physician Assistant

## 2020-09-13 ENCOUNTER — Other Ambulatory Visit: Payer: Self-pay

## 2020-09-13 ENCOUNTER — Observation Stay (HOSPITAL_BASED_OUTPATIENT_CLINIC_OR_DEPARTMENT_OTHER)
Admission: EM | Admit: 2020-09-13 | Discharge: 2020-09-14 | Disposition: A | Discharge status: Home / Self Care | Payer: Medicare Other | Attending: Internal Medicine | Admitting: Internal Medicine

## 2020-09-13 DIAGNOSIS — C61 Malignant neoplasm of prostate: Secondary | ICD-10-CM | POA: Diagnosis present

## 2020-09-13 DIAGNOSIS — I120 Hypertensive chronic kidney disease with stage 5 chronic kidney disease or end stage renal disease: Secondary | ICD-10-CM | POA: Diagnosis not present

## 2020-09-13 DIAGNOSIS — E1122 Type 2 diabetes mellitus with diabetic chronic kidney disease: Secondary | ICD-10-CM | POA: Diagnosis not present

## 2020-09-13 DIAGNOSIS — Z87891 Personal history of nicotine dependence: Secondary | ICD-10-CM | POA: Insufficient documentation

## 2020-09-13 DIAGNOSIS — D649 Anemia, unspecified: Secondary | ICD-10-CM | POA: Diagnosis present

## 2020-09-13 DIAGNOSIS — Z79899 Other long term (current) drug therapy: Secondary | ICD-10-CM | POA: Diagnosis not present

## 2020-09-13 DIAGNOSIS — N3041 Irradiation cystitis with hematuria: Secondary | ICD-10-CM | POA: Insufficient documentation

## 2020-09-13 DIAGNOSIS — Z992 Dependence on renal dialysis: Secondary | ICD-10-CM | POA: Diagnosis not present

## 2020-09-13 DIAGNOSIS — I10 Essential (primary) hypertension: Secondary | ICD-10-CM | POA: Diagnosis present

## 2020-09-13 DIAGNOSIS — Z8546 Personal history of malignant neoplasm of prostate: Secondary | ICD-10-CM | POA: Diagnosis not present

## 2020-09-13 DIAGNOSIS — G629 Polyneuropathy, unspecified: Secondary | ICD-10-CM

## 2020-09-13 DIAGNOSIS — Z951 Presence of aortocoronary bypass graft: Secondary | ICD-10-CM | POA: Insufficient documentation

## 2020-09-13 DIAGNOSIS — E7800 Pure hypercholesterolemia, unspecified: Secondary | ICD-10-CM | POA: Diagnosis present

## 2020-09-13 DIAGNOSIS — N186 End stage renal disease: Secondary | ICD-10-CM | POA: Diagnosis present

## 2020-09-13 DIAGNOSIS — E1169 Type 2 diabetes mellitus with other specified complication: Secondary | ICD-10-CM | POA: Diagnosis present

## 2020-09-13 DIAGNOSIS — U071 COVID-19: Secondary | ICD-10-CM

## 2020-09-13 DIAGNOSIS — E1159 Type 2 diabetes mellitus with other circulatory complications: Secondary | ICD-10-CM | POA: Diagnosis present

## 2020-09-13 DIAGNOSIS — E039 Hypothyroidism, unspecified: Secondary | ICD-10-CM | POA: Diagnosis not present

## 2020-09-13 DIAGNOSIS — E119 Type 2 diabetes mellitus without complications: Secondary | ICD-10-CM

## 2020-09-13 DIAGNOSIS — I2510 Atherosclerotic heart disease of native coronary artery without angina pectoris: Secondary | ICD-10-CM | POA: Diagnosis not present

## 2020-09-13 DIAGNOSIS — M109 Gout, unspecified: Secondary | ICD-10-CM | POA: Diagnosis present

## 2020-09-13 DIAGNOSIS — D638 Anemia in other chronic diseases classified elsewhere: Secondary | ICD-10-CM | POA: Diagnosis present

## 2020-09-13 LAB — CBC WITH DIFFERENTIAL/PLATELET
Abs Immature Granulocytes: 0.05 10*3/uL (ref 0.00–0.07)
Basophils Absolute: 0 10*3/uL (ref 0.0–0.1)
Basophils Relative: 1 %
Eosinophils Absolute: 0.1 10*3/uL (ref 0.0–0.5)
Eosinophils Relative: 3 %
HCT: 21.4 % — ABNORMAL LOW (ref 39.0–52.0)
Hemoglobin: 6.6 g/dL — CL (ref 13.0–17.0)
Immature Granulocytes: 1 %
Lymphocytes Relative: 7 %
Lymphs Abs: 0.3 10*3/uL — ABNORMAL LOW (ref 0.7–4.0)
MCH: 26.9 pg (ref 26.0–34.0)
MCHC: 30.8 g/dL (ref 30.0–36.0)
MCV: 87.3 fL (ref 80.0–100.0)
Monocytes Absolute: 0.3 10*3/uL (ref 0.1–1.0)
Monocytes Relative: 8 %
Neutro Abs: 3.2 10*3/uL (ref 1.7–7.7)
Neutrophils Relative %: 80 %
Platelets: 128 10*3/uL — ABNORMAL LOW (ref 150–400)
RBC: 2.45 MIL/uL — ABNORMAL LOW (ref 4.22–5.81)
RDW: 18.8 % — ABNORMAL HIGH (ref 11.5–15.5)
WBC: 4 10*3/uL (ref 4.0–10.5)
nRBC: 0 % (ref 0.0–0.2)

## 2020-09-13 LAB — COMPREHENSIVE METABOLIC PANEL
ALT: 11 U/L (ref 0–44)
AST: 11 U/L — ABNORMAL LOW (ref 15–41)
Albumin: 3.9 g/dL (ref 3.5–5.0)
Alkaline Phosphatase: 91 U/L (ref 38–126)
Anion gap: 12 (ref 5–15)
BUN: 53 mg/dL — ABNORMAL HIGH (ref 8–23)
CO2: 29 mmol/L (ref 22–32)
Calcium: 8.6 mg/dL — ABNORMAL LOW (ref 8.9–10.3)
Chloride: 98 mmol/L (ref 98–111)
Creatinine, Ser: 5.25 mg/dL — ABNORMAL HIGH (ref 0.61–1.24)
GFR, Estimated: 11 mL/min — ABNORMAL LOW (ref >60)
Glucose, Bld: 138 mg/dL — ABNORMAL HIGH (ref 70–99)
Potassium: 4.1 mmol/L (ref 3.5–5.1)
Sodium: 139 mmol/L (ref 135–145)
Total Bilirubin: 0.6 mg/dL (ref 0.3–1.2)
Total Protein: 6.1 g/dL — ABNORMAL LOW (ref 6.5–8.1)

## 2020-09-13 LAB — PROTIME-INR
INR: 1.1 (ref 0.8–1.2)
Prothrombin Time: 13.9 seconds (ref 11.4–15.2)

## 2020-09-13 LAB — GLUCOSE, CAPILLARY
Glucose-Capillary: 155 mg/dL — ABNORMAL HIGH (ref 70–99)
Glucose-Capillary: 169 mg/dL — ABNORMAL HIGH (ref 70–99)
Glucose-Capillary: 154 mg/dL — ABNORMAL HIGH (ref 70–99)

## 2020-09-13 LAB — PREPARE RBC (CROSSMATCH)

## 2020-09-13 LAB — RESP PANEL BY RT-PCR (FLU A&B, COVID) ARPGX2
Influenza A by PCR: NEGATIVE
Influenza B by PCR: NEGATIVE
SARS Coronavirus 2 by RT PCR: POSITIVE — AB

## 2020-09-13 LAB — IRON AND TIBC
Iron: 34 ug/dL — ABNORMAL LOW (ref 45–182)
Saturation Ratios: 10 % — ABNORMAL LOW (ref 17.9–39.5)
TIBC: 347 ug/dL (ref 250–450)
UIBC: 313 ug/dL

## 2020-09-13 LAB — RETICULOCYTES
Immature Retic Fract: 14.6 % (ref 2.3–15.9)
RBC.: 2.43 MIL/uL — ABNORMAL LOW (ref 4.22–5.81)
Retic Count, Absolute: 71.9 10*3/uL (ref 19.0–186.0)
Retic Ct Pct: 3 % (ref 0.4–3.1)

## 2020-09-13 LAB — TSH: TSH: 4.361 u[IU]/mL (ref 0.350–4.500)

## 2020-09-13 LAB — FERRITIN: Ferritin: 263 ng/mL (ref 24–336)

## 2020-09-13 LAB — HEMOGLOBIN AND HEMATOCRIT, BLOOD
HCT: 22.5 % — ABNORMAL LOW (ref 39.0–52.0)
Hemoglobin: 7.1 g/dL — ABNORMAL LOW (ref 13.0–17.0)

## 2020-09-13 LAB — FOLATE: Folate: 28.5 ng/mL (ref >5.9)

## 2020-09-13 LAB — VITAMIN B12: Vitamin B-12: 276 pg/mL (ref 180–914)

## 2020-09-13 MED ORDER — LOSARTAN POTASSIUM 50 MG PO TABS
100.0000 mg | ORAL_TABLET | Freq: Every day | ORAL | Status: DC
  Administered 2020-09-14: 100 mg via ORAL
  Filled 2020-09-13: qty 2

## 2020-09-13 MED ORDER — LEVOTHYROXINE SODIUM 112 MCG PO TABS
112.0000 ug | ORAL_TABLET | Freq: Every day | ORAL | Status: DC
  Administered 2020-09-14: 112 ug via ORAL
  Filled 2020-09-13: qty 1

## 2020-09-13 MED ORDER — SODIUM CHLORIDE 0.9% FLUSH: 3.0000 mL | INTRAVENOUS | Status: DC | PRN

## 2020-09-13 MED ORDER — SODIUM CHLORIDE 0.9% IV SOLUTION: Freq: Once | INTRAVENOUS | Status: AC

## 2020-09-13 MED ORDER — ALLOPURINOL 100 MG PO TABS
200.0000 mg | ORAL_TABLET | Freq: Every day | ORAL | Status: DC
  Administered 2020-09-14: 200 mg via ORAL
  Filled 2020-09-13: qty 2

## 2020-09-13 MED ORDER — PANTOPRAZOLE SODIUM 40 MG PO TBEC
40.0000 mg | DELAYED_RELEASE_TABLET | Freq: Every day | ORAL | Status: DC
  Administered 2020-09-14: 40 mg via ORAL
  Filled 2020-09-13: qty 1

## 2020-09-13 MED ORDER — PROCHLORPERAZINE MALEATE 5 MG PO TABS
5.0000 mg | ORAL_TABLET | Freq: Two times a day (BID) | ORAL | Status: DC
Start: 2020-09-13 — End: 2020-09-14
  Administered 2020-09-13: 5 mg via ORAL
  Filled 2020-09-13 (×3): qty 1

## 2020-09-13 MED ORDER — SODIUM CHLORIDE 0.9% IV SOLUTION: Freq: Once | INTRAVENOUS | Status: DC

## 2020-09-13 MED ORDER — SEVELAMER CARBONATE 800 MG PO TABS
800.0000 mg | ORAL_TABLET | Freq: Three times a day (TID) | ORAL | Status: DC
  Administered 2020-09-14: 800 mg via ORAL
  Filled 2020-09-13: qty 1

## 2020-09-13 MED ORDER — SODIUM CHLORIDE 0.9 % IV SOLN: 250.0000 mL | INTRAVENOUS | Status: DC | PRN

## 2020-09-13 MED ORDER — CARVEDILOL 12.5 MG PO TABS
12.5000 mg | ORAL_TABLET | Freq: Two times a day (BID) | ORAL | Status: DC
  Administered 2020-09-14: 12.5 mg via ORAL
  Filled 2020-09-13: qty 1

## 2020-09-13 MED ORDER — ACETAMINOPHEN 325 MG PO TABS: 650.0000 mg | ORAL_TABLET | Freq: Four times a day (QID) | ORAL | Status: DC | PRN

## 2020-09-13 MED ORDER — SODIUM CHLORIDE 0.9% FLUSH
3.0000 mL | Freq: Two times a day (BID) | INTRAVENOUS | Status: DC
  Administered 2020-09-13: 3 mL via INTRAVENOUS

## 2020-09-13 MED ORDER — ROSUVASTATIN CALCIUM 5 MG PO TABS
10.0000 mg | ORAL_TABLET | Freq: Every day | ORAL | Status: DC
  Administered 2020-09-14: 10 mg via ORAL
  Filled 2020-09-13: qty 2

## 2020-09-13 MED ORDER — INSULIN ASPART 100 UNIT/ML IJ SOLN: 0.0000 [IU] | Freq: Three times a day (TID) | INTRAMUSCULAR | Status: DC

## 2020-09-13 MED ORDER — ACETAMINOPHEN 650 MG RE SUPP: 650.0000 mg | Freq: Four times a day (QID) | RECTAL | Status: DC | PRN

## 2020-09-13 NOTE — Progress Notes (Signed)
Jon, [name] ([id number]) Visit Report for 09/12/2020 HBO Details Patient Name: Date of Service: [name], [name] 09/12/2020 8:00 A M Medical Record Number: [id number] Patient Account Number: [id number] Date of Birth/Sex: Treating RN: [birth year] (70 y.o. [name] Primary Care [name]: [name], [place] Other Clinician: Referring Montrice Gracey: Treating Ainslie Mazurek/Extender: [name], [name] Weeks in Treatment: 5 HBO Treatment Course Details Treatment Course Number: 1 Ordering Riyansh Gerstner: Linton Ham T Treatments Ordered: otal 40 HBO Treatment Start Date: 08/14/2020 HBO Indication: Late Effect of Radiation Notes: Radiation Cystitis HBO Treatment Details Treatment Number: 20 Patient Type: Outpatient Chamber Type: Monoplace Chamber Serial #: [id number] Treatment Protocol: 2.5 ATA with 90 minutes oxygen, with two 5 minute air breaks Treatment Details Compression Rate Down: 2.5 psi / minute De-Compression Rate Up: 2.5 psi / minute A breaks and breathing ir Compress Tx Pressure periods Decompress Decompress Begins Reached (leave unused spaces Begins Ends blank) Chamber Pressure (ATA 1 2.5 2.5 2.5 2.5 2.5 - - 2.5 1 ) Clock Time (24 hr) 07:58 08:08 08:38 08:43 09:13 09:18 - - 09:48 09:58 Treatment Length: 120 (minutes) Treatment Segments: 4 Vital Signs Capillary Blood Glucose Reference Range: 80 - 120 mg / dl HBO Diabetic Blood Glucose Intervention Range: <131 mg/dl or >249 mg/dl Time Vitals Blood Respiratory Capillary Blood Glucose Pulse Action Type: Pulse: Temperature: Taken: Pressure: Rate: Glucose (mg/dl): Meter #: Oximetry (%) Taken: Pre 07:38 127/47 69 18 98 198 Post 09:58 143/61 61 18 97.6 171 Treatment Response Treatment Completion Status: Treatment Completed without Adverse Event Myrissa Chipley Notes No concerns with treatment given. Patient was seen for 30-day evaluation. [Please see separate note] Physician HBO Attestation: I certify that I supervised this HBO  treatment in accordance with Medicare guidelines. A trained emergency response team is readily available per Yes hospital policies and procedures. Continue HBOT as ordered. Yes Electronic Signature(s) Signed: 09/12/2020 5:45:41 PM By: Linton Ham MD Entered By: Linton Ham on 09/12/2020 15:05:18 -------------------------------------------------------------------------------- HBO Safety Checklist Details Patient Name: Date of Service: [name], [name] 09/12/2020 8:00 A M Medical Record Number: [id number] Patient Account Number: [id number] Date of Birth/Sex: Treating RN: [birth year] (70 y.o. [name] Primary Care Ulices Maack: [name], [place] Other Clinician: Referring Fay Swider: Treating Allizon Woznick/Extender: [name], [name] Weeks in Treatment: 5 HBO Safety Checklist Items Safety Checklist Consent Form Signed Patient voided / foley secured and emptied When did you last eato 0630- eggs, toast Last dose of injectable or oral agent na Ostomy pouch emptied and vented if applicable NA All implantable devices assessed, documented and approved NA Intravenous access site secured and place NA Valuables secured Linens and cotton and cotton/polyester blend (less than 51% polyester) Personal oil-based products / skin lotions / body lotions removed Wigs or hairpieces removed Smoking or tobacco materials removed Books / newspapers / magazines / loose paper removed Cologne, aftershave, perfume and deodorant removed Jewelry removed (may wrap wedding band) Make-up removed Hair care products removed Battery operated devices (external) removed Heating patches and chemical warmers removed Titanium eyewear removed Nail polish cured greater than 10 hours NA Casting material cured greater than 10 hours NA Hearing aids removed NA Loose dentures or partials removed NA Prosthetics have been removed Patient demonstrates correct use of air break device (if  applicable) Patient concerns have been addressed Patient grounding bracelet on and cord attached to chamber Specifics for Inpatients (complete in addition to above) Medication sheet sent with patient NA Intravenous medications needed or due during therapy sent with patient NA  Drainage tubes (e.g. nasogastric tube or chest tube secured and vented) NA Endotracheal or Tracheotomy tube secured NA Cuff deflated of air and inflated with saline NA Airway suctioned NA Electronic Signature(s) Signed: 09/13/2020 5:57:05 PM By: Levan Hurst RN, BSN Entered By: Levan Hurst on 09/12/2020 08:27:58

## 2020-09-13 NOTE — Progress Notes (Signed)
Received a phone call from Facility: [facility]  Requesting MD: Jacelyn Grip Patient with h/o PVD; prostate CA; hypothyroidism; HTN; HLD; DM; CAD; and ESRD on MWF HD presenting with symptomatic anemia, sent by urology.  He needs admission for symptomatic anemia - normally does home HD 4x/week, last yesterday.  H/o prostate CA and hemorrhagic cystitis, not on AC.  Has had hematuria, foley was placed Friday.  Hematuria is improving, Hgb 6.6.  He is hemodynamically stable.  Urology saw in the office today - haven't spoken with them, will request note. Plan of care: Continue transfusion, needs nephrology consult, f/u CBC. The patient will be accepted for admission to Med Surg at [facility] when bed is available.    Nursing staff, Please call the [place] number at the top of Amion at the time of the patient's arrival so that the patient can be paged to the admitting physician.   Carlyon Shadow, M.D. [facility]

## 2020-09-13 NOTE — ED Notes (Signed)
Covid swab to lab, informed test is needed for admission

## 2020-09-13 NOTE — Progress Notes (Signed)
MATIAS, THURMAN ([id number]) Visit Report for 09/12/2020 Arrival Information Details Patient Name: Date of Service: GREENE, DIODATO 09/12/2020 8:00 A M Medical Record Number: [id number] Patient Account Number: [id number] Date of Birth/Sex: Treating RN: 08-Oct-1950 (70 y.o. [name] Primary Care Kassi Esteve: [name], [place] Other Clinician: Referring Hussien Greenblatt: Treating Judge Duque/Extender: [name], [name] Weeks in Treatment: 5 Visit Information History Since Last Visit Added or deleted any medications: No Patient Arrived: [name] Any new allergies or adverse reactions: No Arrival Time: 07:38 Had a fall or experienced change in No Accompanied By: alone activities of daily living that may affect Transfer Assistance: None risk of falls: Patient Identification Verified: Yes Signs or symptoms of abuse/neglect since last visito No Secondary Verification Process Completed: Yes Hospitalized since last visit: No Patient Requires Transmission-Based Precautions: No Implantable device outside of the clinic excluding No Patient Has Alerts: Yes cellular tissue based products placed in the center Patient Alerts: BP Right Arm Only since last visit: Pain Present Now: No Electronic Signature(s) Signed: 09/13/2020 5:57:05 PM By: Levan Hurst RN, BSN Entered By: Levan Hurst on 09/12/2020 08:26:48 -------------------------------------------------------------------------------- Encounter Discharge Information Details Patient Name: Date of Service: [name], [place] 09/12/2020 8:00 A M Medical Record Number: [id number] Patient Account Number: [id number] Date of Birth/Sex: Treating RN: 09-Mar-1951 (70 y.o. [name] Primary Care Daielle Melcher: [name], [place] Other Clinician: Referring Romesha Scherer: Treating Morrison Mcbryar/Extender: [name], [name] Weeks in Treatment: 5 Encounter Discharge Information Items Discharge Condition: Stable Ambulatory Status: [name] Discharge Destination:  Home Transportation: Private Auto Accompanied By: alone Schedule Follow-up Appointment: Yes Clinical Summary of Care: Patient Declined Electronic Signature(s) Signed: 09/13/2020 5:57:05 PM By: Levan Hurst RN, BSN Entered By: Levan Hurst on 09/12/2020 12:22:22 -------------------------------------------------------------------------------- [place] Details Patient Name: Date of Service: [name], [place] 09/12/2020 8:00 A M Medical Record Number: [id number] Patient Account Number: [id number] Date of Birth/Sex: Treating RN: 26-Jul-1950 (70 y.o. [name] Primary Care Paytyn Mesta: [name], [place] Other Clinician: Referring Joden Bonsall: Treating Lashaunta Sicard/Extender: [name], [name] Weeks in Treatment: 5 Vital Signs Time Taken: 07:38 Temperature (F): 98.0 Height (in): 70 Pulse (bpm): 69 Weight (lbs): 195 Respiratory Rate (breaths/min): 18 Body Mass Index (BMI): 28 Blood Pressure (mmHg): 127/47 Capillary Blood Glucose (mg/dl): 198 Reference Range: 80 - 120 mg / dl Electronic Signature(s) Signed: 09/13/2020 5:57:05 PM By: Levan Hurst RN, BSN Entered By: Levan Hurst on 09/12/2020 08:27:03

## 2020-09-13 NOTE — ED Triage Notes (Signed)
He prents here with c/o of "they found my hemoglobin to be low at the urologist, so we came here". He also tells [place] he is a dialysis pt. He also tells [place] he has a foley catheter that the urology p.a. chose not to remove today. I informed pt. And his wife that we do not do transfusions here, to which they replied "We were told that you do give transfusions". He is ambulatory and in no distress.

## 2020-09-13 NOTE — Progress Notes (Signed)
BETTIE, SWAVELY ([id number]) Visit Report for 09/07/2020 SuperBill Details Patient Name: Date of Service: ADONIS, YIM 09/07/2020 Medical Record Number: [id number] Patient Account Number: [id number] Date of Birth/Sex: Treating RN: 04-23-1950 (70 y.o. 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

## 2020-09-13 NOTE — ED Notes (Signed)
Attempted to provide HandOff Report, RN unavailable

## 2020-09-13 NOTE — ED Notes (Signed)
Phone HandOff Report given to rec RN at [place] at [facility] main campus

## 2020-09-13 NOTE — ED Notes (Signed)
Hospitalist and floor made aware of positive COVID result prior to transport. RN on floor states patient can still come to assigned room.

## 2020-09-13 NOTE — Progress Notes (Signed)
MILLAN, LEGAN ([id number]) Visit Report for 09/12/2020 HPI Details Patient Name: Date of Service: [name], [name] 09/12/2020 7:30 A M Medical Record Number: [id number] Patient Account Number: [id number] Date of Birth/Sex: Treating RN: 04/27/1950 (70 y.o. Burnadette Pop, Lauren Primary Care Provider: [name], [place] Other Clinician: Referring Provider: Treating Provider/Extender: [name], [name] Weeks in Treatment: 5 History of Present Illness HPI Description: ADMISSION 05/04/2019 This is a 70 year old patient who spent a prolonged period of time in this clinic in 2014-2015 cared for by Dr. Jerline Pain with apparently predominantly wounds on the left plantar foot. This apparently healed up. He is a type II diabetic with a history of peripheral neuropathy and PAD. In September 2020 he ended up with a right fourth and fifth ray extensive amputation I believe by Dr. [name] for underlying osteomyelitis. Prior to the surgery he had arterial studies that were normal on the right with an ABI of 1.26 and triphasic waveforms and on the left noncompressible with triphasic waveforms. Unfortunately they did not do TBI's. In any case he apparently developed a nonhealing area on the right lateral foot. He has been applying Silvadene to this. He uses his own running shoe with a custom insert. He has an AFO on the left which is fixed in the other shoe. He is here for a second opinion of this wound. Unfortunately 2 days ago he also had a fall and he has a considerably long laceration across the mid tibia. The only Steri-Stripped this they did not go to the ER this was not sutured although it probably should have been Past medical history; type 2 diabetes with a history of PAD and peripheral neuropathy, prostate CA treated with radiation, cervical spondylosis, left foot ulcer, history of osteomyelitis coronary artery disease status post CABG in 2005 recent fistula creation in the left arm in preparation for the  dialysis, gout and a history of venous insufficiency ABIs were not done in this clinic as they were done in September 1.26 on the right and 2.18 on the left noncompressible. TBI's were not done but his waveforms were said to be triphasic bilaterally 05/13/2019; I reviewed the operative report from Dr. [name] on 12/14/2018. I think he went into the OR expecting to just have amputation of the fourth and fifth toes but after incision the margins were still completely infected and necrotic therefore the surgery was extended to encompass the metatarsals. Tissue was sent for culture this showed E. coli I am not really sure about the duration of his antibiotics whether he received this at dialysis or not. The patient thinks his wound has been present ever since the surgery. This is on the remanent of the metatarsals laterally. Last week hyper granulated this week really healthy looking with an epithelialized margin He also has a traumatic laceration which was Steri-Stripped by [place] last week. This has open areas inferiorly but I did not remove the Steri-Strips this week. I am going to give this another week and then remove them next week and deal with what is there. Finally we got a phone call from the transplant people at [facility]. They want to know if this is going to be healed whether he can be delayed for 6 months on the list. 2/12; patient has a small wound on the right lateral foot. This looks healthy. X-ray showed no acute bony abnormality no evidence of osteomyelitis. He is noted to have heavy vascular calcification however. He points out a new area on the tip  of the left second toe with loss of the nail in this area this is the area where he has a AFO brace 2/19; both the patient's wound area on the right lateral foot and the left second toe are healed. With regards to the right lateral foot I think this is a pressure/friction area related to how he walks with his right foot in an AFO. I think the crucial  thing here is pressure relief. The patient is going to go see people at [employer] and I support this He is anxious to be back on the transplant list at [facility] for kidney transplant or at least not to be removed from it. We will try to communicate with the transplant team at [place] [place] [facility] 08/07/2020 This is a patient we previously had in this clinic for wounds on his lower extremity in the setting of type 2 diabetes. [See notes above]. He is currently here for consideration of hyperbaric oxygen referred by Dr. [name] of urology. The patient had a history of T2A prostate cancer treated with 40 radiation treatments a total of 78Gy finishing on 09/05/2016. He did very well. As far as I am aware his prostate cancer is effectively cured and he had really no symptoms up until March of this year. He required an extensive admission to hospital from 06/16/2020 through 07/01/2020 with gross hematuria refractory to outpatient management. He underwent Foley catheter placement and three-way irrigation. However he continued to have large hematuria with clots. He underwent cystoscopic evaluation with fulguration on 06/17/2020 but his hematuria persisted requiring blood transfusion and continuing on continuous bladder irrigation. He underwent cystourethroscopy with fulguration on 3/12 and it was repeated on 2 occasions on 3/23 and 3/24. Since this timeframe he has continued to have moderate total stream hematuria with blood clots. He was back in the ER on 07/21/2020 with urinary retention requiring a Foley catheter but it was removed after a week. He continues to have obvious continued bleeding and has had no recent urinary retention. Continues to have blood clots Past medical history includes type 2 diabetes on dialysis for the last year which he is doing via home dialysis. He has a history of coronary artery disease with remote CABG, atrial flutter undergone an ablation, gastroesophageal reflux disease, right  foot ulcers with previous ray amputations, right partial cuboid excision. 6/7; the patient was doing fairly well until the mid part of last week then he developed gross hematuria with clots. He was having trouble voiding. He was seen by Dr. [name] of urology who placed a Foley catheter in him and did a complete bladder irrigation. He still has the Foley catheter in he tells me he is going to urology tomorrow to have it removed. I read Dr. Alan Ripper notes I think there was confidence expressed of the diagnosis. No mention of any future plans to do another endoscopy. I think that would probably be based on how he does tomorrow after removal of the catheter. The patient is disappointed thought he was doing well and his urine had really cleared up. He is not in any pain today. I emphasized that it still a little early to rule out ineffectiveness of HBO in this problem as he is only a dive #20 today Electronic Signature(s) Signed: 09/12/2020 5:45:41 PM By: Linton Ham MD Entered By: Linton Ham on 09/12/2020 08:05:17 -------------------------------------------------------------------------------- Physical Exam Details Patient Name: Date of Service: [name], [place] 09/12/2020 7:30 A M Medical Record Number: [id number] Patient Account Number: [id number] Date of Birth/Sex: Treating  RN: 03/25/1951 (70 y.o. Erie Noe Primary Care Provider: [name], [place] Other Clinician: Referring Provider: Treating Provider/Extender: [name], [name] Weeks in Treatment: 5 Gastrointestinal (GI) Abdomen is soft and non-distended without masses or tenderness.. No liver or spleen enlargement. Genitourinary (GU) Foley catheter in place nondistended bladder. Electronic Signature(s) Signed: 09/12/2020 5:45:41 PM By: Linton Ham MD Entered By: Linton Ham on 09/12/2020 08:05:39 -------------------------------------------------------------------------------- Physician Orders  Details Patient Name: Date of Service: [name], [place] 09/12/2020 7:30 A M Medical Record Number: [id number] Patient Account Number: [id number] Date of Birth/Sex: Treating RN: October 18, 1950 (70 y.o. [name] Primary Care Provider: [name], [place] Other Clinician: Referring Provider: Treating Provider/Extender: [name], [name] Weeks in Treatment: 5 Verbal / Phone Orders: No Diagnosis Coding ICD-10 Coding Code Description N30.41 Irradiation cystitis with hematuria Z85.46 Personal history of malignant neoplasm of prostate Hyperbaric Oxygen Therapy Evaluate for HBO Therapy - 30 day eval - continue Hyperbarics daily as ordered Indication: - Radiation Cystitis 2.5 ATA for 90 Minutes with 2 Five (5) Minute A Breaks ir Total Number of Treatments: - 40 One treatments per day (delivered Monday through Friday unless otherwise specified in Special Instructions below): Finger stick Blood Glucose Pre- and Post- HBOT Treatment. Follow Hyperbaric Oxygen Glycemia Protocol Afrin (Oxymetazoline HCL) 0.05% nasal spray - 1 spray in both nostrils daily as needed prior to HBO treatment for difficulty clearing ears GLYCEMIA INTERVENTIONS PROTOCOL PRE-HBO GLYCEMIA INTERVENTIONS ACTION INTERVENTION Obtain pre-HBO capillary blood glucose (ensure 1 1 physician order is in chart). A. Notify HBO physician and await physician orders. 2 If result is 70 mg/dl or below: B. If the result meets the hospital definition of a critical result, follow hospital policy. A. Give patient an 8 ounce Glucerna Shake, an 8 ounce Ensure, or 8 ounces of a Glucerna/Ensure equivalent dietary supplement*. B. Wait 30 minutes. If result is 71 mg/dl to 130 mg/dl: C. Retest patients capillary blood glucose (CBG). D. If result greater than or equal to 110 mg/dl, proceed with HBO. If result less than 110 mg/dl, notify HBO physician and consider holding HBO. If result is 131 mg/dl to 249 mg/dl: A. Proceed  with HBO. A. Notify HBO physician and await physician orders. B. It is recommended to hold HBO and do If result is 250 mg/dl or greater: blood/urine ketone testing. C. If the result meets the hospital definition of a critical result, follow hospital policy. POST-HBO GLYCEMIA INTERVENTIONS ACTION INTERVENTION Obtain post HBO capillary blood glucose (ensure 1 physician order is in chart). A. Notify HBO physician and await physician orders. 2 If result is 70 mg/dl or below: B. If the result meets the hospital definition of a critical result, follow hospital policy. A. Give patient an 8 ounce Glucerna Shake, an 8 ounce Ensure, or 8 ounces of a Glucerna/Ensure equivalent dietary supplement*. B. Wait 15 minutes for symptoms of If result is 71 mg/dl to 100 mg/dl: hypoglycemia (i.e. nervousness, anxiety, sweating, chills, clamminess, irritability, confusion, tachycardia or dizziness). C. If patient asymptomatic, discharge patient. If patient symptomatic, repeat capillary blood glucose (CBG) and notify HBO physician. If result is 101 mg/dl to 249 mg/dl: A. Discharge patient. A. Notify HBO physician and await physician orders. B. It is recommended to do blood/urine ketone If result is 250 mg/dl or greater: testing. C. If the result meets the hospital definition of a critical result, follow hospital policy. *Juice or candies are NOT equivalent products. If patient refuses the Glucerna or Ensure, please consult  the hospital dietitian for an appropriate substitute. Electronic Signature(s) Signed: 09/12/2020 5:45:41 PM By: Linton Ham MD Signed: 09/13/2020 5:57:05 PM By: Levan Hurst RN, BSN Entered By: Levan Hurst on 09/12/2020 08:12:16 -------------------------------------------------------------------------------- Problem List Details Patient Name: Date of Service: [name], [place] 09/12/2020 7:30 A M Medical Record Number: [id number] Patient Account Number: [id number] Date of  Birth/Sex: Treating RN: 04-23-1950 (70 y.o. Burnadette Pop, Lauren Primary Care Provider: [name], [place] Other Clinician: Referring Provider: Treating Provider/Extender: [name], [name] Weeks in Treatment: 5 Active Problems ICD-10 Encounter Code Description Active Date MDM Diagnosis N30.41 Irradiation cystitis with hematuria 08/07/2020 No Yes Z85.46 Personal history of malignant neoplasm of prostate 08/07/2020 No Yes Inactive Problems Resolved Problems Electronic Signature(s) Signed: 09/12/2020 5:45:41 PM By: Linton Ham MD Signed: 09/13/2020 5:57:05 PM By: Levan Hurst RN, BSN Entered By: Levan Hurst on 09/12/2020 08:10:44 -------------------------------------------------------------------------------- Progress Note Details Patient Name: Date of Service: [name], [place] 09/12/2020 7:30 A M Medical Record Number: [id number] Patient Account Number: [id number] Date of Birth/Sex: Treating RN: 1950-07-07 (70 y.o. Burnadette Pop, Lauren Primary Care Provider: [name], [place] Other Clinician: Referring Provider: Treating Provider/Extender: [name], [name] Weeks in Treatment: 5 Subjective History of Present Illness (HPI) ADMISSION 05/04/2019 This is a 70 year old patient who spent a prolonged period of time in this clinic in 2014-2015 cared for by Dr. Jerline Pain with apparently predominantly wounds on the left plantar foot. This apparently healed up. He is a type II diabetic with a history of peripheral neuropathy and PAD. In September 2020 he ended up with a right fourth and fifth ray extensive amputation I believe by Dr. [name] for underlying osteomyelitis. Prior to the surgery he had arterial studies that were normal on the right with an ABI of 1.26 and triphasic waveforms and on the left noncompressible with triphasic waveforms. Unfortunately they did not do TBI's. In any case he apparently developed a nonhealing area on the right lateral foot. He has been  applying Silvadene to this. He uses his own running shoe with a custom insert. He has an AFO on the left which is fixed in the other shoe. He is here for a second opinion of this wound. Unfortunately 2 days ago he also had a fall and he has a considerably long laceration across the mid tibia. The only Steri-Stripped this they did not go to the ER this was not sutured although it probably should have been Past medical history; type 2 diabetes with a history of PAD and peripheral neuropathy, prostate CA treated with radiation, cervical spondylosis, left foot ulcer, history of osteomyelitis coronary artery disease status post CABG in 2005 recent fistula creation in the left arm in preparation for the dialysis, gout and a history of venous insufficiency ABIs were not done in this clinic as they were done in September 1.26 on the right and 2.18 on the left noncompressible. TBI's were not done but his waveforms were said to be triphasic bilaterally 05/13/2019; I reviewed the operative report from Dr. [name] on 12/14/2018. I think he went into the OR expecting to just have amputation of the fourth and fifth toes but after incision the margins were still completely infected and necrotic therefore the surgery was extended to encompass the metatarsals. Tissue was sent for culture this showed E. coli I am not really sure about the duration of his antibiotics whether he received this at dialysis or not. The patient thinks his wound has been present ever since the  surgery. This is on the remanent of the metatarsals laterally. Last week hyper granulated this week really healthy looking with an epithelialized margin He also has a traumatic laceration which was Steri-Stripped by [place] last week. This has open areas inferiorly but I did not remove the Steri-Strips this week. I am going to give this another week and then remove them next week and deal with what is there. Finally we got a phone call from the transplant people  at [facility]. They want to know if this is going to be healed whether he can be delayed for 6 months on the list. 2/12; patient has a small wound on the right lateral foot. This looks healthy. X-ray showed no acute bony abnormality no evidence of osteomyelitis. He is noted to have heavy vascular calcification however. He points out a new area on the tip of the left second toe with loss of the nail in this area this is the area where he has a AFO brace 2/19; both the patient's wound area on the right lateral foot and the left second toe are healed. With regards to the right lateral foot I think this is a pressure/friction area related to how he walks with his right foot in an AFO. I think the crucial thing here is pressure relief. The patient is going to go see people at [employer] and I support this He is anxious to be back on the transplant list at [facility] for kidney transplant or at least not to be removed from it. We will try to communicate with the transplant team at [facility] 08/07/2020 This is a patient we previously had in this clinic for wounds on his lower extremity in the setting of type 2 diabetes. [See notes above]. He is currently here for consideration of hyperbaric oxygen referred by Dr. [name] of urology. The patient had a history of T2A prostate cancer treated with 40 radiation treatments a total of 78Gy finishing on 09/05/2016. He did very well. As far as I am aware his prostate cancer is effectively cured and he had really no symptoms up until March of this year. He required an extensive admission to hospital from 06/16/2020 through 07/01/2020 with gross hematuria refractory to outpatient management. He underwent Foley catheter placement and three-way irrigation. However he continued to have large hematuria with clots. He underwent cystoscopic evaluation with fulguration on 06/17/2020 but his hematuria persisted requiring blood transfusion and continuing on continuous bladder  irrigation. He underwent cystourethroscopy with fulguration on 3/12 and it was repeated on 2 occasions on 3/23 and 3/24. Since this timeframe he has continued to have moderate total stream hematuria with blood clots. He was back in the ER on 07/21/2020 with urinary retention requiring a Foley catheter but it was removed after a week. He continues to have obvious continued bleeding and has had no recent urinary retention. Continues to have blood clots Past medical history includes type 2 diabetes on dialysis for the last year which he is doing via home dialysis. He has a history of coronary artery disease with remote CABG, atrial flutter undergone an ablation, gastroesophageal reflux disease, right foot ulcers with previous ray amputations, right partial cuboid excision. 6/7; the patient was doing fairly well until the mid part of last week then he developed gross hematuria with clots. He was having trouble voiding. He was seen by Dr. [name] of urology who placed a Foley catheter in him and did a complete bladder irrigation. He still has the Foley catheter in he  tells me he is going to urology tomorrow to have it removed. I read Dr. Alan Ripper notes I think there was confidence expressed of the diagnosis. No mention of any future plans to do another endoscopy. I think that would probably be based on how he does tomorrow after removal of the catheter. The patient is disappointed thought he was doing well and his urine had really cleared up. He is not in any pain today. I emphasized that it still a little early to rule out ineffectiveness of HBO in this problem as he is only a dive #20 today Objective Gastrointestinal (GI) Abdomen is soft and non-distended without masses or tenderness.. No liver or spleen enlargement. Genitourinary (GU) Foley catheter in place nondistended bladder. Assessment Active Problems ICD-10 Irradiation cystitis with hematuria Personal history of malignant neoplasm of  prostate Plan 1. We will see how he does tomorrow with removal of the Foley catheter. 2. He is tolerating HBO. 3. I emphasized that we do not really expect any change until we get closer to 30 dives for people with radiation cystitis Electronic Signature(s) Signed: 09/12/2020 5:45:41 PM By: Linton Ham MD Entered By: Linton Ham on 09/12/2020 08:06:29 -------------------------------------------------------------------------------- SuperBill Details Patient Name: Date of Service: [name], [place] 09/12/2020 Medical Record Number: [id number] Patient Account Number: [id number] Date of Birth/Sex: Treating RN: 01/25/1951 (70 y.o. Erie Noe Primary Care Provider: [name], [place] Other Clinician: Referring Provider: Treating Provider/Extender: [name], [name] Weeks in Treatment: 5 Diagnosis Coding ICD-10 Codes Code Description N30.41 Irradiation cystitis with hematuria Z85.46 Personal history of malignant neoplasm of prostate Facility Procedures CPT4 Code: [id number] Description: 06301 - WOUND CARE VISIT-LEV 2 EST PT Modifier: Quantity: 1 Electronic Signature(s) Signed: 09/12/2020 5:45:41 PM By: Linton Ham MD Signed: 09/13/2020 5:57:05 PM By: Levan Hurst RN, BSN Entered By: Levan Hurst on 09/12/2020 08:13:23

## 2020-09-13 NOTE — Progress Notes (Signed)
ZYQUAN, CROTTY ([id number]) Visit Report for 09/07/2020 HBO Details Patient Name: Date of Service: [name] 09/07/2020 8:00 A M Medical Record Number: [id number] Patient Account Number: [id number] Date of Birth/Sex: Treating RN: 04-15-1950 (70 y.o. [name] Primary Care Hikaru Delorenzo: [name], [place] Other Clinician: Referring Jenisse Vullo: Treating Lenoard Helbert/Extender: [name], [name] Weeks in Treatment: 4 HBO Treatment Course Details Treatment Course Number: 1 Ordering Brizeyda Holtmeyer: Linton Ham T Treatments Ordered: otal 40 HBO Treatment Start Date: 08/14/2020 HBO Indication: Late Effect of Radiation Notes: Radiation Cystitis HBO Treatment Details Treatment Number: 18 Patient Type: Outpatient Chamber Type: Monoplace Chamber Serial #: [id number] Treatment Protocol: 2.5 ATA with 90 minutes oxygen, with two 5 minute air breaks Treatment Details Compression Rate Down: 2.0 psi / minute De-Compression Rate Up: 2.5 psi / minute A breaks and breathing ir Compress Tx Pressure periods Decompress Decompress Begins Reached (leave unused spaces Begins Ends blank) Chamber Pressure (ATA 1 2.5 2.5 2.5 2.5 2.5 - - 2.5 1 ) Clock Time (24 hr) 08:02 08:15 08:45 08:50 09:20 09:25 - - 09:55 10:05 Treatment Length: 123 (minutes) Treatment Segments: 4 Vital Signs Capillary Blood Glucose Reference Range: 80 - 120 mg / dl HBO Diabetic Blood Glucose Intervention Range: <131 mg/dl or >249 mg/dl Time Vitals Blood Respiratory Capillary Blood Glucose Pulse Action Type: Pulse: Temperature: Taken: Pressure: Rate: Glucose (mg/dl): Meter #: Oximetry (%) Taken: Pre 07:44 113/47 68 18 98 190 Post 10:05 129/53 63 16 97.3 139 Treatment Response Treatment Completion Status: Treatment Completed without Adverse Event Jakira Mcfadden Notes No concerns with treatment given Physician HBO Attestation: I certify that I supervised this HBO treatment in accordance with Medicare guidelines. A trained  emergency response team is readily available per Yes hospital policies and procedures. Continue HBOT as ordered. Yes Electronic Signature(s) Signed: 09/07/2020 4:50:31 PM By: Linton Ham MD Entered By: Linton Ham on 09/07/2020 14:14:58 -------------------------------------------------------------------------------- HBO Safety Checklist Details Patient Name: Date of Service: [name], [place] 09/07/2020 8:00 A M Medical Record Number: [id number] Patient Account Number: [id number] Date of Birth/Sex: Treating RN: 29-Jun-1950 (70 y.o. [name] Primary Care Bonney Berres: [name], [place] Other Clinician: Referring Orenthal Debski: Treating Luay Balding/Extender: [name], [name] Weeks in Treatment: 4 HBO Safety Checklist Items Safety Checklist Consent Form Signed Patient voided / foley secured and emptied When did you last eato 0700 - buttered toast Last dose of injectable or oral agent na Ostomy pouch emptied and vented if applicable NA All implantable devices assessed, documented and approved NA Intravenous access site secured and place NA Valuables secured Linens and cotton and cotton/polyester blend (less than 51% polyester) Personal oil-based products / skin lotions / body lotions removed Wigs or hairpieces removed Smoking or tobacco materials removed Books / newspapers / magazines / loose paper removed Cologne, aftershave, perfume and deodorant removed Jewelry removed (may wrap wedding band) Make-up removed Hair care products removed Battery operated devices (external) removed Heating patches and chemical warmers removed Titanium eyewear removed Nail polish cured greater than 10 hours Casting material cured greater than 10 hours NA Hearing aids removed NA Loose dentures or partials removed NA Prosthetics have been removed Patient demonstrates correct use of air break device (if applicable) Patient concerns have been addressed Patient grounding bracelet  on and cord attached to chamber Specifics for Inpatients (complete in addition to above) Medication sheet sent with patient NA Intravenous medications needed or due during therapy sent with patient NA Drainage tubes (e.g. nasogastric tube or chest tube secured and  vented) NA Endotracheal or Tracheotomy tube secured NA Cuff deflated of air and inflated with saline NA Airway suctioned NA Electronic Signature(s) Signed: 09/13/2020 5:57:05 PM By: Levan Hurst RN, BSN Entered By: Levan Hurst on 09/07/2020 09:00:48

## 2020-09-13 NOTE — Progress Notes (Signed)
KYAL, ARTS ([id number]) Visit Report for 09/13/2020 SuperBill Details Patient Name: Date of Service: SACRAMENTO, MONDS 09/13/2020 Medical Record Number: [id number] Patient Account Number: [id number] Date of Birth/Sex: Treating RN: 01-26-1951 (69 y.o. [name] Primary Care Provider: [name], [place] Other Clinician: Referring Provider: Treating Provider/Extender: Doyle Askew, [name] Weeks in Treatment: 5 Diagnosis Coding ICD-10 Codes Code Description N30.41 Irradiation cystitis with hematuria Z85.46 Personal history of malignant neoplasm of prostate Facility Procedures CPT4 Code Description Modifier Quantity [id number] G0277-(Facility Use Only) HBOT full body chamber, 22min , 2 [id number]-Physician attendance and supervision of hyperbaric oxygen therapy, per session 1 ICD-10 Diagnosis Description N30.41 Irradiation cystitis with hematuria Physician Procedures Quantity CPT4 Code Description Modifier [id number] 98421 - WC PHYS HYPERBARIC OXYGEN THERAPY 1 ICD-10 Diagnosis Description N30.41 Irradiation cystitis with hematuria Electronic Signature(s) Signed: 09/13/2020 5:40:25 PM By: Worthy Keeler PA-C Signed: 09/13/2020 6:05:11 PM By: Deon Pilling Entered By: Deon Pilling on 09/13/2020 16:54:10

## 2020-09-13 NOTE — H&P (Signed)
History and Physical    [name] BTD:[id number] DOB: [birth year] DOA: 09/13/2020  PCP: Lorenda Hatchet, FNP Consultants:  Urology-Dr. [name], endocrinology, pulmonology: Dr. Melvyn Novas, cardiology: Dr. Einar Gip Patient coming from: [facility].  - lives with wife and his son/partner and grandson   Chief Complaint: symptomatic anemia. Was seen at urologist and saw hemoglobin and advised they go to ER.   HPI: [name] is a 70 y.o. male with medical history significant of ESRD on home hemo dialysis, diabetes type 2, HTN, CAD, HLD, hypothyroidism, prostate cancer, neuropathy, gout. He presented to his urologist today to take out his catheter that was put in last Friday and was found to have a hemoglobin of 6.6. Advised he go to ER. He has red urine, but it's getting lighter. Likely source of bleeding. He is overall asymptomatic. Found to have incidental covid. He would like another test. He is fatigued but denies any chest pain, shortness of breath, coughing, lower leg swelling. No nasal congestion or upper respiratory symptoms. No dark or black stools.   Home dialysis. Does Sunday, Monday, Wednesday, Thursday. Missed his dialysis today.   ED Course: found to have hgb of 6.6 and sent to [facility] for admit for symptomatic anemia.   Review of Systems: As per HPI; otherwise review of systems reviewed and negative.   Ambulatory Status: Ambulates with walker   COVID Vaccine Status:  2 covid vaccines and 2 boosters. Last booster was 4-5 months ago.   Past Medical History:  Diagnosis Date  . Ambulates with cane    straight cane  . Cervical myelopathy ([place]) 02/06/2018  . Chronic kidney disease    dailysis M W F- home  . Complication of anesthesia   . Coronary artery disease   . Diabetes mellitus without complication ([place])    type2  . Diabetic foot ulcer ([place])   . Diabetic neuropathy ([place]) 02/06/2018  . Gait abnormality 08/24/2018  . GERD (gastroesophageal reflux disease)     01/06/20- not current  .  Gout   . History of kidney stones    passed stones  . Hypercholesteremia   . Hypertension   . Hypothyroidism   . Neuromuscular disorder (H[facility])    neuropathy left leg and bilateral feet  . Neuropathy   . PONV (postoperative nausea and vomiting)   . Prostate cancer ([place])   . PVD (peripheral vascular disease) ([place])    with amputations    Past Surgical History:  Procedure Laterality Date  . A-FLUTTER ABLATION N/A 04/06/2020   Procedure: A-FLUTTER ABLATION;  Surgeon: [name], MD;  Location: [place] CV [facility];  Service: Cardiovascular;  Laterality: N/A;  . AMPUTATION Left 12/25/2013   Procedure: AMPUTATION RAY LEFT 5TH RAY;  Surgeon: [name], MD;  Location: [facility];  Service: Orthopedics;  Laterality: Left;  . AMPUTATION Right 12/15/2018   Procedure: AMPUTATION OF 4TH AND 5TH TOES RIGHT FOOT;  Surgeon: [name], MD;  Location: [place];  Service: Orthopedics;  Laterality: Right;  . APPLICATION OF WOUND VAC Right 12/15/2018   Procedure: APPLICATION OF WOUND VAC;  Surgeon: [name], MD;  Location: [place];  Service: Orthopedics;  Laterality: Right;  . AV FISTULA PLACEMENT Left 03/25/2019   Procedure: LEFT ARM ARTERIOVENOUS (AV) FISTULA CREATION;  Surgeon: [name], MD;  Location: [place];  Service: Vascular;  Laterality: Left;  . BACK SURGERY    . BASCILIC VEIN TRANSPOSITION Left 05/20/2019   Procedure: SECOND STAGE LEFT BASCILIC VEIN TRANSPOSITION;  Surgeon: [name], MD;  Location: [facility];  Service: Vascular;  Laterality: Left;  . CARDIAC CATHETERIZATION  02/17/2014  . CHOLECYSTECTOMY    . COLONOSCOPY  2011   in [place], [place] GRAFT  2008  . CYSTOSCOPY N/A 06/29/2020   Procedure: CYSTOSCOPY WITH CLOT EVACUATION AND  FULGERATION;  Surgeon: [name], MD;  Location: [place];  Service: Urology;  Laterality: N/A;  . CYSTOSCOPY WITH FULGERATION Bilateral 06/17/2020   Procedure: CYSTOSCOPY,BILATERAL RETROGRADE, CLOT EVACUATION  WITH FULGERATION OF THE BLADDER;  Surgeon: Janith Lima, MD;  Location: [place];  Service: Urology;  Laterality: Bilateral;  . CYSTOSCOPY WITH FULGERATION N/A 06/28/2020   Procedure: CYSTOSCOPY WITH CLOT EVACUATION AND FULGERATION OF BLEEDERS;  Surgeon: [name], MD;  Location: [place];  Service: Urology;  Laterality: N/A;  1 HR  . I & D EXTREMITY Right 12/15/2018   Procedure: DEBRIDEMENT RIGHT FOOT;  Surgeon: [name], MD;  Location: [place];  Service: Orthopedics;  Laterality: Right;  . I & D EXTREMITY Right 08/27/2019   Procedure: PARTIAL CUBOID EXCISION RIGHT FOOT;  Surgeon: [name], MD;  Location: [place];  Service: Orthopedics;  Laterality: Right;  . I & D EXTREMITY Right 01/07/2020   Procedure: RIGHT FOOT EXCISION INFECTED BONE;  Surgeon: [name], MD;  Location: [place];  Service: Orthopedics;  Laterality: Right;  . NECK SURGERY     novemver 2019  . TRANSURETHRAL RESECTION OF PROSTATE N/A 06/28/2020   Procedure: TRANSURETHRAL RESECTION OF THE PROSTATE (TURP);  Surgeon: [name], MD;  Location: [place];  Service: Urology;  Laterality: N/A;  . WISDOM TOOTH EXTRACTION      Social History   Socioeconomic History  . Marital status: Married    Spouse name: Not on file  . Number of children: 2  . Years of education: Not on file  . Highest education level: Not on file  Occupational History  . 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  Tobacco Use  . Smoking status: Former Smoker    Years: 25.00    Types: Cigars    Start date: 04/08/1973    Quit date: 12/24/1988    Years since quitting: 31.7  . Smokeless tobacco: Never Used  . Tobacco comment: Cigars and Pipe   Vaping Use  . Vaping Use: Never used  Substance and Sexual Activity  . Alcohol use: No  . Drug use: No  . Sexual activity: Yes    Partners: Female  Other Topics Concern  . Not on file  Social History Narrative   Regular exercise: yes 3 times a week   Caffeine use: hot tea   Social Determinants of  Health   Financial Resource Strain: Not on file  Food Insecurity: Not on file  Transportation Needs: Not on file  Physical Activity: Not on file  Stress: Not on file  Social Connections: Not on file  Intimate Partner Violence: Not on file    Allergies  Allergen Reactions  . Mushroom Extract Complex Nausea Only    Family History  Problem Relation Age of Onset  . Diabetes Mellitus II Mother   . Kidney disease Mother   . Diabetes Mellitus II Father   . CAD Father   . Cancer Father        prostate  . Kidney disease Father   . Diabetes Mellitus II Brother   . Kidney disease Brother   . Diabetes Mellitus II Brother   . Stomach cancer  Brother 62  . Kidney disease Brother   . Colon cancer Neg Hx   . Colon polyps Neg Hx   . Esophageal cancer Neg Hx   . Rectal cancer Neg Hx   . Pancreatic cancer Neg Hx     Prior to Admission medications   Medication Sig Start Date End Date Taking? Authorizing Provider  acetaminophen (TYLENOL) 500 MG tablet Take 1,000-2,000 mg by mouth 2 (two) times daily as needed (pain).    [provider]  allopurinol (ZYLOPRIM) 100 MG tablet Take 200 mg by mouth daily.  05/04/18   [provider]  B Complex-C-Zn-Folic Acid (DIALYVITE 409 WITH ZINC) 0.8 MG TABS Take 1 tablet by mouth daily. 12/23/19   [provider]  calcitRIOL (ROCALTROL) 0.5 MCG capsule Take 0.5 mcg by mouth daily.  08/11/19   [provider]  carvedilol (COREG) 12.5 MG tablet Take 12.5 mg by mouth in the morning and at bedtime.    [provider]  chlorhexidine (PERIDEX) 0.12 % solution 15 mLs by Mouth Rinse route daily. 12/07/19   [provider]  diclofenac Sodium (VOLTAREN) 1 % GEL Apply 1 application topically 4 (four) times daily as needed (pain). 05/04/19   [provider]  docusate sodium (COLACE) 100 MG capsule Take 1 capsule (100 mg total) by mouth 2 (two) times daily. Patient not taking: No sig reported 07/01/20   Antonieta Pert,  MD  Lancets (ONETOUCH DELICA PLUS [id number]) [place]  01/17/20   [provider]  levothyroxine (SYNTHROID, LEVOTHROID) 112 MCG tablet Take 112 mcg by mouth daily before breakfast. BRAND NAME SYNTHROID    [provider]  losartan (COZAAR) 100 MG tablet Take 100 mg by mouth daily.    [provider]  Methoxy PEG-Epoetin Beta (MIRCERA IJ) Inject into the skin. 12/23/19   [provider]  mupirocin cream (BACTROBAN) 2 % Apply 1 application topically daily as needed (wound care). 05/23/20   [provider]  oxyCODONE-acetaminophen (PERCOCET/ROXICET) 5-325 MG tablet Take 1 tablet by mouth every 4 (four) hours as needed. Patient not taking: Reported on 07/22/2020 01/07/20   [name], MD  pantoprazole (PROTONIX) 40 MG tablet Take 1 tablet (40 mg total) by mouth daily. 05/11/20   Irene Shipper, MD  Polyethyl Glycol-Propyl Glycol (LUBRICANT EYE DROPS) 0.4-0.3 % SOLN Place 1-2 drops into both eyes 3 (three) times daily as needed (dry/irritated eyes.).    [provider]  pregabalin (LYRICA) 150 MG capsule Take 1 capsule (150 mg total) by mouth in the morning and at bedtime. 05/09/20   Persons, Bevely Palmer, [place]  PRESCRIPTION MEDICATION Apply 1 application topically daily as needed (psoriasis). Cream for psoriasis    [provider]  prochlorperazine (COMPAZINE) 5 MG tablet Take 1 tablet (5 mg total) by mouth 2 (two) times daily. 08/14/20   Irene Shipper, MD  rosuvastatin (CRESTOR) 10 MG tablet Take 10 mg by mouth daily.     [provider]  Semaglutide,0.25 or 0.5MG /DOS, (OZEMPIC, 0.25 OR 0.5 MG/DOSE,) 2 MG/1.5ML SOPN Inject 0.5 mg into the skin every Friday.    [provider]  sevelamer carbonate (RENVELA) 800 MG tablet Take 800 mg by mouth 3 (three) times daily. 07/20/20   [provider]    Physical Exam: Vitals:   09/13/20 1129 09/13/20 1130 09/13/20 1310 09/13/20 1744  BP: (!) 145/68  (!) 150/63 (!) 176/76  Pulse: (!) 58   70 72  Resp: 20  18 16   Temp: 98.4  F (36.9 C)   97.6 F (36.4 C)  TempSrc: Oral   Oral  SpO2: 100%  100% 98%  Weight:  88.5 kg    Height:  5\' 10"  (1.778 m)       . General:  Appears calm and comfortable and is in NAD . Eyes:  PERRL, EOMI, normal lids, iris. Pallor over face/sclera.  . ENT:  grossly normal hearing, lips & tongue, mmm; appropriate dentition . Neck:  no LAD, masses or thyromegaly; no carotid bruits . Cardiovascular:  RRR, no m/r/g. No LE edema.  Marland Kitchen Respiratory:   CTA bilaterally with no wheezes/rales/rhonchi.  Normal respiratory effort. . Abdomen:  soft, NT, ND, NABS . Back:   normal alignment, no CVAT . Skin:  no rash or induration seen on limited exam. Catheter in place with pink/red urine.  . Musculoskeletal:  grossly normal tone BUE/BLE, good ROM, no bony abnormality . Lower extremity:  No LE edema.  Limited foot exam with no ulcerations.  2+ distal pulses. Marland Kitchen Psychiatric:  grossly normal mood and affect, speech fluent and appropriate, AOx3 . Neurologic:  CN 2-12 grossly intact, moves all extremities in coordinated fashion, sensation intact    Radiological Exams on Admission: Independently reviewed - see discussion in A/P where applicable  No results found.    Labs on Admission: I have personally reviewed the available labs and imaging studies at the time of the admission.  Pertinent labs: H&H of 6.6. baseline of 7.0-8.5 covid 19+ Creatinine 5.25, BUN: 53   Assessment/Plan  Symptomatic anemia' -likely from bladder/catheter. Urology following to make sure no active bleed that needs intervention.  -consented and will transfuse 1 unit of PRBC with recheck of H&H. Baseline H&H around 7.0-8.5. do not want to overload him with ESRD.     Diabetes mellitus -appears to be very tightly controlled. Last a1c 3 months ago was 5.7.  -recheck today -diabetic diet and sliding scale insulin as needed.     ESRD (end stage renal disease)  -home dialysis, called  nephrology and no acute findings that they need to consult at this time. If he gets home tomorrow he can do dialysis at home.  -consult nephro if any change or acute warranted event/lab    HTN (hypertension) -continue home medication of losartan and coreg.  -has been decently controlled since being here, but will need to watch with transfusion and need for dialysis.     CAD (coronary artery disease) -continue statin/beta blocker    Hypercholesteremia -continue statin     Neuropathy  -continue lyrica     Gout -continue allopurinol.. no acute flairs.     Hypothyroidism -continue synthroid. tsh pending    Prostate cancer  --followed by urology   covid 19  -incidental finding. Completely asymptomatic. Has been vaccinated and boosted x 2.     Body mass index is 27.98 kg/m.    Level of care: Med-Surg DVT prophylaxis: SCDs, no anticoagulation due to symptomatic anemia  Code Status: full- confirmed with patient/family Family Communication: None present Disposition Plan:  The patient is from: home   Anticipated d/c date will depend on clinical response to treatment, but possibly as early as tomorrow if he has excellent response to treatment   Consults called: urology following.  Admission status:  Observation    Orma Flaming MD [facility]   How to contact the [facility] Attending or Consulting provider [place] or covering provider during after hours [name], for this patient?  1. Check the care  team in [facility] and look for a) attending/consulting [place] provider listed and b) the [facility] team listed 2. Log into [email or web address] and use [place]'s universal password to access. If you do not have the password, please contact the hospital operator. 3. Locate the [facility] provider you are looking for under [facility] and page to a number that you can be directly reached. 4. If you still have difficulty reaching the provider, please page the [facility] (Director on Call) for the  Hospitalists listed on amion for assistance.   09/13/2020, 7:38 PM

## 2020-09-13 NOTE — Consult Note (Signed)
Urology Consult   Physician requesting consult: Dr. Lorin Mercy  Reason for consult: Hematuria  History of Present Illness: [name] is a 70 y.o. patient of Dr. [name] with a history of prostate cancer s/p radiation therapy.  He has had radiation cystitis requiring numerous interventions/treatment including fulgurations and alum irrigation.  He is currently completing hyperbaric oxygen therapy and had his 17th of 40 treatments today.  He was noted to be very lethargic at the wound center and was sent for urologic evaluation.  His Hgb was noted to be 6.6 and he was symptomatic with significant fatigue on evaluation.  He had a hematuria catheter placed last week in our office and clots irrigated from the bladder.  He was supposed to have his catheter removed but after further evaluation, it was determined that his catheter should remain in place and he was recommended to go to the [facility] ER for evaluation and probably blood transfusion.  He has ESRD and does home hemodialysis M-W-F.  He did not dialyze today.  He has been admitted with plans to receive one unit of PRBCs for now to avoid volume overload and considering his baseline Hgb is typically between 7 and 8 recently.    He was also noted to be COVID positive upon admission.    Past Medical History:  Diagnosis Date  . Ambulates with cane    straight cane  . Cervical myelopathy ([place]) 02/06/2018  . Chronic kidney disease    dailysis M W F- home  . Complication of anesthesia   . Coronary artery disease   . Diabetes mellitus without complication ([place])    type2  . Diabetic foot ulcer ([place])   . Diabetic neuropathy ([place]) 02/06/2018  . Gait abnormality 08/24/2018  . GERD (gastroesophageal reflux disease)     01/06/20- not current  . Gout   . History of kidney stones    passed stones  . Hypercholesteremia   . Hypertension   . Hypothyroidism   . Neuromuscular disorder (H[facility])    neuropathy left leg and bilateral feet  . Neuropathy   .  PONV (postoperative nausea and vomiting)   . Prostate cancer ([name])   . PVD (peripheral vascular disease) ([place])    with amputations    Past Surgical History:  Procedure Laterality Date  . A-FLUTTER ABLATION N/A 04/06/2020   Procedure: A-FLUTTER ABLATION;  Surgeon: [name], MD;  Location: [place] [facility];  Service: Cardiovascular;  Laterality: N/A;  . AMPUTATION Left 12/25/2013   Procedure: AMPUTATION RAY LEFT 5TH RAY;  Surgeon: [name], MD;  Location: [facility];  Service: Orthopedics;  Laterality: Left;  . AMPUTATION Right 12/15/2018   Procedure: AMPUTATION OF 4TH AND 5TH TOES RIGHT FOOT;  Surgeon: [name], MD;  Location: [place];  Service: Orthopedics;  Laterality: Right;  . APPLICATION OF WOUND VAC Right 12/15/2018   Procedure: APPLICATION OF WOUND VAC;  Surgeon: [name], MD;  Location: [place];  Service: Orthopedics;  Laterality: Right;  . AV FISTULA PLACEMENT Left 03/25/2019   Procedure: LEFT ARM ARTERIOVENOUS (AV) FISTULA CREATION;  Surgeon: [name], MD;  Location: [place];  Service: Vascular;  Laterality: Left;  . BACK SURGERY    . BASCILIC VEIN TRANSPOSITION Left 05/20/2019   Procedure: SECOND STAGE LEFT BASCILIC VEIN TRANSPOSITION;  Surgeon: [name], MD;  Location: [facility];  Service: Vascular;  Laterality: Left;  . CARDIAC CATHETERIZATION  02/17/2014  . CHOLECYSTECTOMY    . COLONOSCOPY  2011   in [place], [place] GRAFT  2008  . CYSTOSCOPY N/A 06/29/2020   Procedure: CYSTOSCOPY WITH CLOT EVACUATION AND  FULGERATION;  Surgeon: [name], MD;  Location: [place];  Service: Urology;  Laterality: N/A;  . CYSTOSCOPY WITH FULGERATION Bilateral 06/17/2020   Procedure: CYSTOSCOPY,BILATERAL RETROGRADE, CLOT EVACUATION WITH FULGERATION OF THE BLADDER;  Surgeon: Janith Lima, MD;  Location: [facility];  Service: Urology;  Laterality: Bilateral;  . CYSTOSCOPY WITH FULGERATION N/A 06/28/2020   Procedure: CYSTOSCOPY WITH CLOT  EVACUATION AND FULGERATION OF BLEEDERS;  Surgeon: [name], MD;  Location: [place];  Service: Urology;  Laterality: N/A;  1 HR  . I & D EXTREMITY Right 12/15/2018   Procedure: DEBRIDEMENT RIGHT FOOT;  Surgeon: [name], MD;  Location: [place];  Service: Orthopedics;  Laterality: Right;  . I & D EXTREMITY Right 08/27/2019   Procedure: PARTIAL CUBOID EXCISION RIGHT FOOT;  Surgeon: [name], MD;  Location: [place];  Service: Orthopedics;  Laterality: Right;  . I & D EXTREMITY Right 01/07/2020   Procedure: RIGHT FOOT EXCISION INFECTED BONE;  Surgeon: [name], MD;  Location: [place];  Service: Orthopedics;  Laterality: Right;  . NECK SURGERY     novemver 2019  . TRANSURETHRAL RESECTION OF PROSTATE N/A 06/28/2020   Procedure: TRANSURETHRAL RESECTION OF THE PROSTATE (TURP);  Surgeon: [name], MD;  Location: [facility];  Service: Urology;  Laterality: N/A;  . WISDOM TOOTH EXTRACTION      Medications:  Home meds:  No current facility-administered medications on file prior to encounter.   Current Outpatient Medications on File Prior to Encounter  Medication Sig Dispense Refill  . acetaminophen (TYLENOL) 500 MG tablet Take 1,000-2,000 mg by mouth 2 (two) times daily as needed (pain).    Marland Kitchen allopurinol (ZYLOPRIM) 100 MG tablet Take 200 mg by mouth daily.     . B Complex-C-Zn-Folic Acid (DIALYVITE 712 WITH ZINC) 0.8 MG TABS Take 1 tablet by mouth daily.    . calcitRIOL (ROCALTROL) 0.5 MCG capsule Take 0.5 mcg by mouth daily.     . carvedilol (COREG) 12.5 MG tablet Take 12.5 mg by mouth in the morning and at bedtime.    . chlorhexidine (PERIDEX) 0.12 % solution 15 mLs by Mouth Rinse route daily.    . diclofenac Sodium (VOLTAREN) 1 % GEL Apply 1 application topically 4 (four) times daily as needed (pain).    Marland Kitchen docusate sodium (COLACE) 100 MG capsule Take 1 capsule (100 mg total) by mouth 2 (two) times daily. (Patient not taking: No sig reported) 10 capsule 0  . Lancets (ONETOUCH  DELICA PLUS [id number]) MISC     . levothyroxine (SYNTHROID, LEVOTHROID) 112 MCG tablet Take 112 mcg by mouth daily before breakfast. BRAND NAME SYNTHROID    . losartan (COZAAR) 100 MG tablet Take 100 mg by mouth daily.    . Methoxy PEG-Epoetin Beta (MIRCERA IJ) Inject into the skin.    . mupirocin cream (BACTROBAN) 2 % Apply 1 application topically daily as needed (wound care).    Marland Kitchen oxyCODONE-acetaminophen (PERCOCET/ROXICET) 5-325 MG tablet Take 1 tablet by mouth every 4 (four) hours as needed. (Patient not taking: Reported on 07/22/2020) 30 tablet 0  . pantoprazole (PROTONIX) 40 MG tablet Take 1 tablet (40 mg total) by mouth daily. 30 tablet 6  . Polyethyl Glycol-Propyl Glycol (LUBRICANT EYE DROPS) 0.4-0.3 % SOLN Place 1-2 drops into both eyes 3 (three) times daily as needed (dry/irritated  eyes.).    Marland Kitchen pregabalin (LYRICA) 150 MG capsule Take 1 capsule (150 mg total) by mouth in the morning and at bedtime. 60 capsule 0  . PRESCRIPTION MEDICATION Apply 1 application topically daily as needed (psoriasis). Cream for psoriasis    . prochlorperazine (COMPAZINE) 5 MG tablet Take 1 tablet (5 mg total) by mouth 2 (two) times daily. 60 tablet 3  . rosuvastatin (CRESTOR) 10 MG tablet Take 10 mg by mouth daily.     . Semaglutide,0.25 or 0.5MG /DOS, (OZEMPIC, 0.25 OR 0.5 MG/DOSE,) 2 MG/1.5ML SOPN Inject 0.5 mg into the skin every Friday.    . sevelamer carbonate (RENVELA) 800 MG tablet Take 800 mg by mouth 3 (three) times daily.       Scheduled Meds: . sodium chloride   Intravenous Once  . [START ON 09/14/2020] insulin aspart  0-6 Units Subcutaneous TID WC  . sodium chloride flush  3 mL Intravenous Q12H   Continuous Infusions: . sodium chloride     PRN Meds:.sodium chloride, acetaminophen **OR** acetaminophen, sodium chloride flush  Allergies:  Allergies  Allergen Reactions  . Mushroom Extract Complex Nausea Only    Family History  Problem Relation Age of Onset  . Diabetes Mellitus II Mother   .  Kidney disease Mother   . Diabetes Mellitus II Father   . CAD Father   . Cancer Father        prostate  . Kidney disease Father   . Diabetes Mellitus II Brother   . Kidney disease Brother   . Diabetes Mellitus II Brother   . Stomach cancer Brother 65  . Kidney disease Brother   . Colon cancer Neg Hx   . Colon polyps Neg Hx   . Esophageal cancer Neg Hx   . Rectal cancer Neg Hx   . Pancreatic cancer Neg Hx     Social History:  reports that he quit smoking about 31 years ago. His smoking use included cigars. He started smoking about 47 years ago. He quit after 25.00 years of use. He has never used smokeless tobacco. He reports that he does not drink alcohol and does not use drugs.  ROS: A complete review of systems was performed.  All systems are negative except for pertinent findings as noted.  Physical Exam:  Vital signs in last 24 hours: Temp:  [97.6 F (36.4 C)-98.4 F (36.9 C)] 97.6 F (36.4 C) (06/08 1744) Pulse Rate:  [58-72] 72 (06/08 1744) Resp:  [16-20] 16 (06/08 1744) BP: (145-176)/(63-76) 176/76 (06/08 1744) SpO2:  [98 %-100 %] 98 % (06/08 1744) Weight:  [88.5 kg] 88.5 kg (06/08 1130) Constitutional:  Alert and oriented, No acute distress Cardiovascular: No JVD Respiratory: Normal respiratory effort GI: Abdomen is soft, non-distended Genitourinary: No CVAT. Normal male phallus.  He has an indwelling 22 Fr hematuria catheter in place and urine is red tinged but without clots and draining well (looking the best it has in a while according to the patient) Lymphatic: No lymphadenopathy Neurologic: Grossly intact, no focal deficits Psychiatric: Normal mood and affect  Laboratory Data:  Recent Labs    09/13/20 1155  WBC 4.0  HGB 6.6*  HCT 21.4*  PLT 128*    Recent Labs    09/13/20 1155  NA 139  K 4.1  CL 98  GLUCOSE 138*  BUN 53*  CALCIUM 8.6*  CREATININE 5.25*     Results for orders placed or performed during the hospital encounter of 09/13/20  (from the past 24 hour(s))  CBC with Differential     Status: Abnormal   Collection Time: 09/13/20 11:55 AM  Result Value Ref Range   WBC 4.0 4.0 - 10.5 K/uL   RBC 2.45 (L) 4.22 - 5.81 MIL/uL   Hemoglobin 6.6 (LL) 13.0 - 17.0 g/dL   HCT 21.4 (L) 39.0 - 52.0 %   MCV 87.3 80.0 - 100.0 fL   MCH 26.9 26.0 - 34.0 pg   MCHC 30.8 30.0 - 36.0 g/dL   RDW 18.8 (H) 11.5 - 15.5 %   Platelets 128 (L) 150 - 400 K/uL   nRBC 0.0 0.0 - 0.2 %   Neutrophils Relative % 80 %   Neutro Abs 3.2 1.7 - 7.7 K/uL   Lymphocytes Relative 7 %   Lymphs Abs 0.3 (L) 0.7 - 4.0 K/uL   Monocytes Relative 8 %   Monocytes Absolute 0.3 0.1 - 1.0 K/uL   Eosinophils Relative 3 %   Eosinophils Absolute 0.1 0.0 - 0.5 K/uL   Basophils Relative 1 %   Basophils Absolute 0.0 0.0 - 0.1 K/uL   Immature Granulocytes 1 %   Abs Immature Granulocytes 0.05 0.00 - 0.07 K/uL  Comprehensive metabolic panel     Status: Abnormal   Collection Time: 09/13/20 11:55 AM  Result Value Ref Range   Sodium 139 135 - 145 mmol/L   Potassium 4.1 3.5 - 5.1 mmol/L   Chloride 98 98 - 111 mmol/L   CO2 29 22 - 32 mmol/L   Glucose, Bld 138 (H) 70 - 99 mg/dL   BUN 53 (H) 8 - 23 mg/dL   Creatinine, Ser 5.25 (H) 0.61 - 1.24 mg/dL   Calcium 8.6 (L) 8.9 - 10.3 mg/dL   Total Protein 6.1 (L) 6.5 - 8.1 g/dL   Albumin 3.9 3.5 - 5.0 g/dL   AST 11 (L) 15 - 41 U/L   ALT 11 0 - 44 U/L   Alkaline Phosphatase 91 38 - 126 U/L   Total Bilirubin 0.6 0.3 - 1.2 mg/dL   GFR, Estimated 11 (L) >60 mL/min   Anion gap 12 5 - 15  Protime-INR     Status: None   Collection Time: 09/13/20 11:55 AM  Result Value Ref Range   Prothrombin Time 13.9 11.4 - 15.2 seconds   INR 1.1 0.8 - 1.2  Vitamin B12     Status: None   Collection Time: 09/13/20 11:55 AM  Result Value Ref Range   Vitamin B-12 276 180 - 914 pg/mL  Folate     Status: None   Collection Time: 09/13/20 11:55 AM  Result Value Ref Range   Folate 28.5 >5.9 ng/mL  Iron and TIBC     Status: Abnormal    Collection Time: 09/13/20 11:55 AM  Result Value Ref Range   Iron 34 (L) 45 - 182 ug/dL   TIBC 347 250 - 450 ug/dL   Saturation Ratios 10 (L) 17.9 - 39.5 %   UIBC 313 ug/dL  Ferritin     Status: None   Collection Time: 09/13/20 11:55 AM  Result Value Ref Range   Ferritin 263 24 - 336 ng/mL  Reticulocytes     Status: Abnormal   Collection Time: 09/13/20 11:55 AM  Result Value Ref Range   Retic Ct Pct 3.0 0.4 - 3.1 %   RBC. 2.43 (L) 4.22 - 5.81 MIL/uL   Retic Count, Absolute 71.9 19.0 - 186.0 K/uL   Immature Retic Fract 14.6 2.3 - 15.9 %  Resp Panel by RT-PCR (Flu  A&B, Covid) Nasopharyngeal Swab     Status: Abnormal   Collection Time: 09/13/20  4:16 PM   Specimen: Nasopharyngeal Swab; Nasopharyngeal(NP) swabs in vial transport medium  Result Value Ref Range   SARS Coronavirus 2 by RT PCR POSITIVE (A) NEGATIVE   Influenza A by PCR NEGATIVE NEGATIVE   Influenza B by PCR NEGATIVE NEGATIVE  Glucose, capillary     Status: Abnormal   Collection Time: 09/13/20  7:17 PM  Result Value Ref Range   Glucose-Capillary 154 (H) 70 - 99 mg/dL   Recent Results (from the past 240 hour(s))  Resp Panel by RT-PCR (Flu A&B, Covid) Nasopharyngeal Swab     Status: Abnormal   Collection Time: 09/13/20  4:16 PM   Specimen: Nasopharyngeal Swab; Nasopharyngeal(NP) swabs in vial transport medium  Result Value Ref Range Status   SARS Coronavirus 2 by RT PCR POSITIVE (A) NEGATIVE Final    Comment: RESULT CALLED TO, READ BACK BY AND VERIFIED WITH: K ZULETA,RN 1705 09/13/2020 DBRADLEY (NOTE) SARS-CoV-2 target nucleic acids are DETECTED.  The SARS-CoV-2 RNA is generally detectable in upper respiratory specimens during the acute phase of infection. Positive results are indicative of the presence of the identified virus, but do not rule out bacterial infection or co-infection with other pathogens not detected by the test. Clinical correlation with patient history and other diagnostic information is necessary to  determine patient infection status. The expected result is Negative.  Fact Sheet for Patients: EntrepreneurPulse.com.au  Fact Sheet for Healthcare Providers: IncredibleEmployment.be  This test is not yet approved or cleared by the [place] FDA and  has been authorized for detection and/or diagnosis of SARS-CoV-2 by FDA under an Emergency Use Authorization (EUA).  This EUA will remain in effect (meaning this test can b e used) for the duration of  the COVID-19 declaration under Section 564(b)(1) of the Act, 21 U.S.C. section 360bbb-3(b)(1), unless the authorization is terminated or revoked sooner.     Influenza A by PCR NEGATIVE NEGATIVE Final   Influenza B by PCR NEGATIVE NEGATIVE Final    Comment: (NOTE) The Xpert Xpress SARS-CoV-2/FLU/RSV plus assay is intended as an aid in the diagnosis of influenza from Nasopharyngeal swab specimens and should not be used as a sole basis for treatment. Nasal washings and aspirates are unacceptable for Xpert Xpress SARS-CoV-2/FLU/RSV testing.  Fact Sheet for Patients: EntrepreneurPulse.com.au  Fact Sheet for Healthcare Providers: IncredibleEmployment.be  This test is not yet approved or cleared by the [place] FDA and has been authorized for detection and/or diagnosis of SARS-CoV-2 by FDA under an Emergency Use Authorization (EUA). This EUA will remain in effect (meaning this test can be used) for the duration of the COVID-19 declaration under Section 564(b)(1) of the Act, 21 U.S.C. section 360bbb-3(b)(1), unless the authorization is terminated or revoked.  Performed at [facility], [address], [place], [place] [address]     Renal Function: Recent Labs    09/13/20 1155  CREATININE 5.25*   Estimated Creatinine Clearance: 14.5 mL/min (A) (by C-G formula based on SCr of 5.25 mg/dL (H)).  Radiologic Imaging: No results  found.  I independently reviewed the above imaging studies.  Impression/Recommendation Radiation cystitis: Leave 22 Fr hematuria catheter in place and ok for nursing staff to hand irrigate with saline prn if clots or not draining well.  If Hgb increases appropriately and catheter is continuing to drain well, will be ok for discharge tomorrow from urology perspective.  I will re-evaluate in the morning and leave  a note.  I have instructed the patient and his wife to call the wound center tomorrow to notify them of his admission and his COVID positive status to determine when he can resume hyperbaric oxygen treatments.  Will likely set up outpatient follow up early next week for possible voiding trial if appropriate.  Dutch Gray 09/13/2020, 7:48 PM    Pryor Curia MD  CC: Dr. Lorin Mercy

## 2020-09-13 NOTE — Progress Notes (Signed)
[name] ([id number]) Visit Report for 09/13/2020 HBO Details Patient Name: Date of Service: [name], [name] 09/13/2020 8:00 A M Medical Record Number: [id number] Patient Account Number: [id number] Date of Birth/Sex: Treating RN: 11-03-1950 (70 y.o. [name] Primary Care [name]: [name], [place] TA LIE Other Clinician: Referring [name]: Treating [name]/Extender: [name], [name] Weeks in Treatment: 5 HBO Treatment Course Details Treatment Course Number: 1 Ordering [name]: [name] T Treatments Ordered: otal 40 HBO Treatment Start Date: 08/14/2020 HBO Indication: Late Effect of Radiation Notes: Radiation Cystitis HBO Treatment Details Treatment Number: 21 Patient Type: Outpatient Chamber Type: Monoplace Chamber Serial #: [id number] Treatment Protocol: 2.5 ATA with 90 minutes oxygen, with two 5 minute air breaks Treatment Details Compression Rate Down: 2.0 psi / minute De-Compression Rate Up: 2.5 psi / minute A breaks and ir breathing Compress Tx Pressure Decompress Decompress periods Begins Reached Begins Ends (leave unused spaces blank) Chamber Pressure (ATA 1 2.5 2.5 2.5 2.5 2.5 - - 2.5 1 ) Clock Time (24 hr) 08:04 08:15 - - - - - - 08:56 09:09 Treatment Length: 65 (minutes) Treatment Segments: 2 Vital Signs Capillary Blood Glucose Reference Range: 80 - 120 mg / dl HBO Diabetic Blood Glucose Intervention Range: <131 mg/dl or >249 mg/dl Time Vitals Blood Respiratory Capillary Blood Glucose Pulse Action Type: Pulse: Temperature: Taken: Pressure: Rate: Glucose (mg/dl): Meter #: Oximetry (%) Taken: Pre 07:40 130/46 72 16 98.7 155 Post 09:10 104/49 70 16 97.6 169 Treatment Response Treatment Toleration: Fair Treatment Completion Status: Treatment Aborted/Not Restarted Reason: [name] Choice Treatment Notes Patient's first air break scheduled for 0845. Upon observation patient is resting soundly with eyes closed, slow deep respirations in and  out. Attempted to wake patient several times calling out name, knocking on the tank, another nurse attempted along with PA trying to wake patient again knocking on chamber, calling out his name. Patient continued to sleep despite all efforts to wake him the time span was 11 minutes passed first air break. Started decompression at 0856 at a rate 2.5. PA at chamber side continuing to attempt to wake patient. PA instructed nurse to call 911 at 0905, may need EMS on stand by here when pulled out if patient does not respond, chamber continues to decompress. Phoned 911 at [phone] name, location provided, explained patient currently in hyberbaric chamber, breaths are even and equal with a respiratory rate of 19, and patient is currently not responding to staff when attempting to reach, per PA need EMS here when patient is removed from chamber if he does not arouse. PA remains at chamber attempting to reach patient, at [phone] PA announces to cancel EMS patient now is awake and alert. Nurse explained to 911 dispatch to cancel EMS call. Patient is now awake, explains he is a deep sleeper and did not realize we have been trying to wake him. Patient states he feels fine. PA and nurse explained due to being several minutes pass air break, patient unable to arouse when called to and knocked on chamber, patient has already been traveled back to surface level and will be pulled out of chamber. Patient removed from chamber at 0909. Vital signs and blood glucose post HBO taken see above notes. Patient states, "I am so sorry you could not wake me. I am a deep sleeper. Sometimes it is hard for my wife to wake me." Explained only way to communicate is through the phone and there is no other way to assess a patient while  inside the chamber. Patient in agreement. PA in agreement for patient to leave office. Electronic Signature(s) Signed: 09/13/2020 5:40:25 PM By: Jon Keeler PA-C Signed: 09/13/2020 6:05:11 PM By: [name] Entered By: [name] on 09/13/2020 16:54:01 -------------------------------------------------------------------------------- HBO Safety Checklist Details Patient Name: Date of Service: [name], [name] 09/13/2020 8:00 A M Medical Record Number: [id number] Patient Account Number: [id number] Date of Birth/Sex: Treating RN: [birth year] (70 y.o. [name] Primary Care [name]: [name], [place] Other Clinician: Referring [name]: Treating [name]/Extender: [name], [name] Weeks in Treatment: 5 HBO Safety Checklist Items Safety Checklist Consent Form Signed Patient voided / foley secured and emptied When did you last eato this morning cheese omelette, coffee Last dose of injectable or oral agent weekly on Fridays Ostomy pouch emptied and vented if applicable NA All implantable devices assessed, documented and approved NA Intravenous access site secured and place NA Valuables secured Linens and cotton and cotton/polyester blend (less than 51% polyester) Personal oil-based products / skin lotions / body lotions removed NA Wigs or hairpieces removed NA Smoking or tobacco materials removed NA Books / newspapers / magazines / loose paper removed NA Cologne, aftershave, perfume and deodorant removed NA Jewelry removed (may wrap wedding band) Make-up removed NA Hair care products removed NA Battery operated devices (external) removed NA Heating patches and chemical warmers removed NA Titanium eyewear removed Nail polish cured greater than 10 hours NA Casting material cured greater than 10 hours NA Hearing aids removed NA Loose dentures or partials removed NA Prosthetics have been removed NA Patient demonstrates correct use of air break device (if applicable) Patient concerns have been addressed Patient grounding bracelet on and cord attached to chamber Specifics for Inpatients (complete in addition to above) Medication sheet sent with  patient Intravenous medications needed or due during therapy sent with patient Drainage tubes (e.g. nasogastric tube or chest tube secured and vented) Endotracheal or Tracheotomy tube secured Cuff deflated of air and inflated with saline Airway suctioned Electronic Signature(s) Signed: 09/13/2020 6:05:11 PM By: [name] Entered By: [name] on 09/13/2020 16:33:20

## 2020-09-13 NOTE — Progress Notes (Signed)
FIELDS, OROS ([id number]) Visit Report for 09/12/2020 SuperBill Details Patient Name: Date of Service: [name], BRANDEL 09/12/2020 Medical Record Number: [id number] Patient Account Number: [id number] Date of Birth/Sex: Treating RN: 06-Mar-1951 (70 y.o. [name] Primary Care Provider: [name], [place] Other Clinician: Referring Provider: Treating Provider/Extender: [name], [name] Weeks in Treatment: 5 Diagnosis Coding ICD-10 Codes Code Description N30.41 Irradiation cystitis with hematuria Z85.46 Personal history of malignant neoplasm of prostate Facility Procedures CPT4 Code Description Modifier Quantity [id number]-(Facility Use Only) HBOT full body chamber, 56min , 4 Physician Procedures Quantity CPT4 Code Description Modifier [id number] - WC PHYS HYPERBARIC OXYGEN THERAPY 1 ICD-10 Diagnosis Description N30.41 Irradiation cystitis with hematuria Z85.46 Personal history of malignant neoplasm of prostate Electronic Signature(s) Signed: 09/12/2020 5:45:41 PM By: Linton Ham MD Signed: 09/13/2020 5:57:05 PM By: Levan Hurst RN, BSN Entered By: Levan Hurst on 09/12/2020 12:22:03

## 2020-09-13 NOTE — Progress Notes (Signed)
Jon, [name] ([id number]) Visit Report for 09/13/2020 Arrival Information Details Patient Name: Date of Service: [name], [name] 09/13/2020 8:00 A M Medical Record Number: [id number] Patient Account Number: [id number] Date of Birth/Sex: Treating RN: Sep 06, 1950 (70 y.o. [name] Primary Care Jakki Doughty: [name], [place] Other Clinician: Referring Ercil Cassis: Treating Jaleiah Asay/Extender: Doyle Askew, NA [name] Weeks in Treatment: 5 Visit Information History Since Last Visit Added or deleted any medications: No Patient Arrived: [name] Any new allergies or adverse reactions: No Arrival Time: 07:40 Had a fall or experienced change in No Accompanied By: self activities of daily living that may affect Transfer Assistance: None risk of falls: Patient Identification Verified: Yes Signs or symptoms of abuse/neglect since last visito No Secondary Verification Process Completed: Yes Hospitalized since last visit: No Patient Requires Transmission-Based Precautions: No Implantable device outside of the clinic excluding No Patient Has Alerts: Yes cellular tissue based products placed in the center Patient Alerts: BP Right Arm Only since last visit: Pain Present Now: No Notes Patient has a morning appt with urology right after Hyberbaric treatment. Electronic Signature(s) Signed: 09/13/2020 6:05:11 PM By: Deon Pilling Entered By: Deon Pilling on 09/13/2020 16:31:24 -------------------------------------------------------------------------------- Encounter Discharge Information Details Patient Name: Date of Service: [name], [name] 09/13/2020 8:00 A M Medical Record Number: [id number] Patient Account Number: [id number] Date of Birth/Sex: Treating RN: 1951-02-20 (70 y.o. [name] Primary Care Starling Jessie: [name], [place] Other Clinician: Referring Monalisa Bayless: Treating Chauna Osoria/Extender: Doyle Askew, NA [name] Weeks in Treatment: 5 Encounter Discharge Information Items Discharge  Condition: Stable Ambulatory Status: [name] Discharge Destination: Home Transportation: Private Auto Accompanied By: self Schedule Follow-up Appointment: Yes Clinical Summary of Care: Electronic Signature(s) Signed: 09/13/2020 6:05:11 PM By: Deon Pilling Entered By: Deon Pilling on 09/13/2020 16:54:27 -------------------------------------------------------------------------------- Vitals Details Patient Name: Date of Service: [name], [name] 09/13/2020 8:00 A M Medical Record Number: [id number] Patient Account Number: [id number] Date of Birth/Sex: Treating RN: [birth year] (70 y.o. [name] Primary Care Gayanne Prescott: [name], [place] Other Clinician: Referring Tahiri Shareef: Treating Tzipporah Nagorski/Extender: Doyle Askew, NA [name] Weeks in Treatment: 5 Vital Signs Time Taken: 07:40 Temperature (F): 98.7 Height (in): 70 Pulse (bpm): 72 Weight (lbs): 195 Respiratory Rate (breaths/min): 16 Body Mass Index (BMI): 28 Blood Pressure (mmHg): 130/46 Capillary Blood Glucose (mg/dl): 155 Reference Range: 80 - 120 mg / dl Electronic Signature(s) Signed: 09/13/2020 6:05:11 PM By: Deon Pilling Entered By: Deon Pilling on 09/13/2020 16:31:42

## 2020-09-13 NOTE — ED Notes (Signed)
[facility] transport team at bedside ?

## 2020-09-13 NOTE — ED Provider Notes (Addendum)
[facility] EMERGENCY DEPT Provider Note   CSN: [id number] Arrival date & time: 09/13/20  1116     History Chief Complaint  Patient presents with    Low Hgb    [name] is a 70 y.o. male.  HPI      70 year old male with a history of diabetes, ESRD on home dialysis Sunday Monday Wednesday Thursday, hypertension, hyperlipidemia, atrial flutter not on anticoagulation, prostate cancer, hematuria related to radiation cystitis seeing [facility], and receiving hyperbaric oxygen therapy, presents with concern for anemia and fatigue.  Reports that he had had worsening hematuria last week, and had difficulty urinating due to blood clots.  He had a catheter placed on Friday, and since then he feels that the hematuria is improved.  He is no longer passing blood clots, feels the Foley catheter is working appropriately.  Reports he does have continued hematuria present.  Reports he has had worsening fatigue over the last week, sleepiness. He fell asleep today during his hyperbaric chamber treatment, which is very unusual.  Reports he was difficult to wake up.  He is not on anticoagulation.  [facility] to [facility] for [facility] admission, however he came here.  Denies black or bloody stools, nausea, vomiting heme, hematemesis.  Denies chest pain or shortness of breath.  Reports significant fatigue.   Past Medical History:  Diagnosis Date   Ambulates with cane    straight cane   Cervical myelopathy (Home) 02/06/2018   Chronic kidney disease    dailysis M W F- home   Complication of anesthesia    Coronary artery disease    Diabetes mellitus without complication ([place])    type2   Diabetic foot ulcer ([place])    Diabetic neuropathy ([place]) 02/06/2018   Gait abnormality 08/24/2018   GERD (gastroesophageal reflux disease)     01/06/20- not current   Gout    History of kidney stones    passed stones   Hypercholesteremia    Hypertension    Hypothyroidism     Neuromuscular disorder (HCC)    neuropathy left leg and bilateral feet   Neuropathy    PONV (postoperative nausea and vomiting)    Prostate cancer ([place])    PVD (peripheral vascular disease) ([place])    with amputations    Patient Active Problem List   Diagnosis Date Noted   Symptomatic anemia 09/13/2020   Pressure injury of skin 06/29/2020   Gross hematuria 06/16/2020   Typical atrial flutter ([place]) 03/24/2020   Bilateral pleural effusion 02/24/2020   Healthcare maintenance 01/28/2020   Shortness of breath 01/28/2020   History of partial ray amputation of fourth toe of right foot ([place]) 09/08/2019   Ulcerated, foot, right, with necrosis of bone ([place])    Chronic cough 06/25/2019   Cutaneous abscess of right foot    Subacute osteomyelitis of right foot ([place]) 12/14/2018   AKI (acute kidney injury) ([place]) 12/14/2018   ESRD (end stage renal disease) ([place]) 12/14/2018   Gait abnormality 08/24/2018   Diabetic neuropathy ([place]) 02/06/2018   Cervical myelopathy ([place]) 02/06/2018   Onychomycosis 10/30/2017   Other spondylosis with radiculopathy, cervical region 01/27/2017   Midfoot ulcer, right, limited to breakdown of skin ([place]) 11/15/2016   Lateral epicondylitis, left elbow 08/12/2016   Cellulitis of fifth toe of right foot 07/29/2016   Prostate cancer ([place]) 06/19/2016   Non-pressure chronic ulcer of other part of right foot limited to breakdown of skin ([place]) 04/04/2016   Cellulitis of leg, right 12/24/2015  Cellulitis of right lower extremity 12/24/2015   CKD (chronic kidney disease), stage III ([place]) 12/25/2013   Foot infection 12/24/2013   Foot ulcer, left ([place]) 12/24/2013   Diabetic foot ulcer ([place]) 09/14/2012   Gout 09/14/2012   HTN (hypertension) 09/14/2012   Hypothyroidism 09/14/2012   CAD (coronary artery disease) 09/14/2012   Diabetes mellitus ([place]) 09/14/2012   Hypercholesteremia    Neuropathy ([place])     Past Surgical History:  Procedure Laterality Date   A-FLUTTER  ABLATION N/A 04/06/2020   Procedure: A-FLUTTER ABLATION;  Surgeon: [name], MD;  Location: [facility];  Service: Cardiovascular;  Laterality: N/A;   AMPUTATION Left 12/25/2013   Procedure: AMPUTATION RAY LEFT 5TH RAY;  Surgeon: [name], MD;  Location: [facility];  Service: Orthopedics;  Laterality: Left;   AMPUTATION Right 12/15/2018   Procedure: AMPUTATION OF 4TH AND 5TH TOES RIGHT FOOT;  Surgeon: [name], MD;  Location: [facility];  Service: Orthopedics;  Laterality: Right;   APPLICATION OF WOUND VAC Right 12/15/2018   Procedure: APPLICATION OF WOUND VAC;  Surgeon: [name], MD;  Location: [facility];  Service: Orthopedics;  Laterality: Right;   AV FISTULA PLACEMENT Left 03/25/2019   Procedure: LEFT ARM ARTERIOVENOUS (AV) FISTULA CREATION;  Surgeon: [name], MD;  Location: [facility];  Service: Vascular;  Laterality: Left;   [place] Left 05/20/2019   Procedure: SECOND STAGE LEFT BASCILIC VEIN TRANSPOSITION;  Surgeon: [name], MD;  Location: [facility];  Service: Vascular;  Laterality: Left;   CARDIAC CATHETERIZATION  02/17/2014   CHOLECYSTECTOMY     COLONOSCOPY  2011   in [place], [place]  2008   CYSTOSCOPY N/A 06/29/2020   Procedure: [place];  Surgeon: [name], MD;  Location: [facility];  Service: Urology;  Laterality: N/A;   CYSTOSCOPY WITH FULGERATION Bilateral 06/17/2020   Procedure: CYSTOSCOPY,BILATERAL RETROGRADE, CLOT EVACUATION WITH FULGERATION OF THE BLADDER;  Surgeon: Janith Lima, MD;  Location: [facility];  Service: Urology;  Laterality: Bilateral;   CYSTOSCOPY WITH FULGERATION N/A 06/28/2020   Procedure: CYSTOSCOPY WITH CLOT EVACUATION AND FULGERATION OF BLEEDERS;  Surgeon: [name], MD;  Location: [facility];  Service: Urology;  Laterality: N/A;  1 HR   I & D EXTREMITY Right 12/15/2018   Procedure: DEBRIDEMENT RIGHT FOOT;  Surgeon: [name], MD;  Location: [facility];  Service: Orthopedics;  Laterality: Right;   I & D EXTREMITY Right 08/27/2019   Procedure: PARTIAL CUBOID EXCISION RIGHT FOOT;  Surgeon: [name], MD;  Location: [facility];  Service: Orthopedics;  Laterality: Right;   I & D EXTREMITY Right 01/07/2020   Procedure: RIGHT FOOT EXCISION INFECTED BONE;  Surgeon: [name], MD;  Location: [facility];  Service: Orthopedics;  Laterality: Right;   NECK SURGERY     novemver 2019   TRANSURETHRAL RESECTION OF PROSTATE N/A 06/28/2020   Procedure: TRANSURETHRAL RESECTION OF THE PROSTATE (TURP);  Surgeon: [name], MD;  Location: [facility];  Service: Urology;  Laterality: N/A;   WISDOM TOOTH EXTRACTION         Family History  Problem Relation Age of Onset   Diabetes Mellitus II Mother    Kidney disease Mother    Diabetes Mellitus II Father    CAD Father    Cancer Father        prostate  Kidney disease Father    Diabetes Mellitus II Brother    Kidney disease Brother    Diabetes Mellitus II Brother    Stomach cancer Brother 23   Kidney disease Brother    Colon cancer Neg Hx    Colon polyps Neg Hx    Esophageal cancer Neg Hx    Rectal cancer Neg Hx    Pancreatic cancer Neg Hx     Social History   Tobacco Use   Smoking status: Former Smoker    Years: 25.00    Types: Cigars    Start date: 04/08/1973    Quit date: 12/24/1988    Years since quitting: 31.7   Smokeless tobacco: Never Used   Tobacco comment: Cigars and Pipe   Vaping Use   Vaping Use: Never used  Substance Use Topics   Alcohol use: No   Drug use: No    Home Medications Prior to Admission medications   Medication Sig Start Date End Date Taking? Authorizing Provider  acetaminophen (TYLENOL) 500 MG tablet Take 1,000-2,000 mg by mouth 2 (two) times daily as needed (pain).    [provider]  allopurinol (ZYLOPRIM) 100 MG tablet Take 200 mg by mouth daily.  05/04/18   [provider]  B Complex-C-Zn-Folic Acid (DIALYVITE  800 WITH ZINC) 0.8 MG TABS Take 1 tablet by mouth daily. 12/23/19   [provider]  calcitRIOL (ROCALTROL) 0.5 MCG capsule Take 0.5 mcg by mouth daily.  08/11/19   [provider]  carvedilol (COREG) 12.5 MG tablet Take 12.5 mg by mouth in the morning and at bedtime.    [provider]  chlorhexidine (PERIDEX) 0.12 % solution 15 mLs by Mouth Rinse route daily. 12/07/19   [provider]  diclofenac Sodium (VOLTAREN) 1 % GEL Apply 1 application topically 4 (four) times daily as needed (pain). 05/04/19   [provider]  docusate sodium (COLACE) 100 MG capsule Take 1 capsule (100 mg total) by mouth 2 (two) times daily. Patient not taking: No sig reported 07/01/20   Antonieta Pert, MD  Lancets (ONETOUCH DELICA PLUS [id number]) [place]  01/17/20   [provider]  levothyroxine (SYNTHROID, LEVOTHROID) 112 MCG tablet Take 112 mcg by mouth daily before breakfast. BRAND NAME SYNTHROID    [provider]  losartan (COZAAR) 100 MG tablet Take 100 mg by mouth daily.    [provider]  Methoxy PEG-Epoetin Beta (MIRCERA IJ) Inject into the skin. 12/23/19   [provider]  mupirocin cream (BACTROBAN) 2 % Apply 1 application topically daily as needed (wound care). 05/23/20   [provider]  oxyCODONE-acetaminophen (PERCOCET/ROXICET) 5-325 MG tablet Take 1 tablet by mouth every 4 (four) hours as needed. Patient not taking: Reported on 07/22/2020 01/07/20   [name], MD  pantoprazole (PROTONIX) 40 MG tablet Take 1 tablet (40 mg total) by mouth daily. 05/11/20   Irene Shipper, MD  Polyethyl Glycol-Propyl Glycol (LUBRICANT EYE DROPS) 0.4-0.3 % SOLN Place 1-2 drops into both eyes 3 (three) times daily as needed (dry/irritated eyes.).    [provider]  pregabalin (LYRICA) 150 MG capsule Take 1 capsule (150 mg total) by mouth in the morning and at bedtime. 05/09/20   Persons, Bevely Palmer, [place]  PRESCRIPTION MEDICATION Apply 1  application topically daily as needed (psoriasis). Cream for psoriasis    [provider]  prochlorperazine (COMPAZINE) 5 MG tablet Take 1 tablet (5 mg total) by mouth 2 (two) times daily. 08/14/20   Henrene Pastor,  Docia Chuck, MD  rosuvastatin (CRESTOR) 10 MG tablet Take 10 mg by mouth daily.     [provider]  Semaglutide,0.25 or 0.5MG /DOS, (OZEMPIC, 0.25 OR 0.5 MG/DOSE,) 2 MG/1.5ML SOPN Inject 0.5 mg into the skin every Friday.    [provider]  sevelamer carbonate (RENVELA) 800 MG tablet Take 800 mg by mouth 3 (three) times daily. 07/20/20   [provider]    Allergies    Mushroom extract complex  Review of Systems   Review of Systems  Constitutional:  Positive for fatigue. Negative for fever.  HENT:  Negative for sore throat.   Eyes:  Negative for visual disturbance.  Respiratory:  Negative for shortness of breath.   Cardiovascular:  Negative for chest pain.  Gastrointestinal:  Negative for abdominal pain, blood in stool, nausea and vomiting.  Genitourinary:  Positive for difficulty urinating (foley in place) and hematuria.  Musculoskeletal:  Negative for back pain and neck stiffness.  Skin:  Negative for rash.  Neurological:  Negative for syncope and headaches.   Physical Exam Updated Vital Signs BP (!) 150/63 (BP Location: Right Arm)   Pulse 70   Temp 98.4 F (36.9 C) (Oral)   Resp 18   Ht 5\' 10"  (1.778 m)   Wt 88.5 kg   SpO2 100%   BMI 27.98 kg/m   Physical Exam Vitals and nursing note reviewed.  Constitutional:      General: He is not in acute distress.    Appearance: He is well-developed. He is not diaphoretic.  HENT:     Head: Normocephalic and atraumatic.  Eyes:     Conjunctiva/sclera: Conjunctivae normal.  Cardiovascular:     Rate and Rhythm: Normal rate and regular rhythm.     Heart sounds: Normal heart sounds. No murmur heard.   No friction rub. No gallop.  Pulmonary:     Effort: Pulmonary effort is normal. No respiratory  distress.     Breath sounds: Normal breath sounds. No wheezing or rales.  Abdominal:     General: There is no distension.     Palpations: Abdomen is soft.     Tenderness: There is no abdominal tenderness. There is no guarding.  Musculoskeletal:     Cervical back: Normal range of motion.  Skin:    General: Skin is warm and dry.  Neurological:     Mental Status: He is alert and oriented to person, place, and time.    ED Results / Procedures / Treatments   Labs (all labs ordered are listed, but only abnormal results are displayed) Labs Reviewed  CBC WITH DIFFERENTIAL/PLATELET - Abnormal; Notable for the following components:      Result Value   RBC 2.45 (*)    Hemoglobin 6.6 (*)    HCT 21.4 (*)    RDW 18.8 (*)    Platelets 128 (*)    Lymphs Abs 0.3 (*)    All other components within normal limits  COMPREHENSIVE METABOLIC PANEL - Abnormal; Notable for the following components:   Glucose, Bld 138 (*)    BUN 53 (*)    Creatinine, Ser 5.25 (*)    Calcium 8.6 (*)    Total Protein 6.1 (*)    AST 11 (*)    GFR, Estimated 11 (*)    All other components within normal limits  IRON AND TIBC - Abnormal; Notable for the following components:   Iron 34 (*)    Saturation Ratios 10 (*)    All other components within  normal limits  RETICULOCYTES - Abnormal; Notable for the following components:   RBC. 2.43 (*)    All other components within normal limits  RESP PANEL BY RT-PCR (FLU A&B, COVID) ARPGX2  PROTIME-INR  VITAMIN B12  FERRITIN  FOLATE    EKG None  Radiology No results found.  Procedures .Critical Care  Date/Time: 10/09/2020 7:42 AM Performed by: Gareth Morgan, MD Authorized by: Gareth Morgan, MD   Critical care provider statement:    Critical care time (minutes):  30   Critical care was time spent personally by me on the following activities:  Examination of patient, ordering and performing treatments and interventions, ordering and review of laboratory  studies, ordering and review of radiographic studies, pulse oximetry, obtaining history from patient or surrogate and review of old charts   Care discussed with: admitting provider and accepting provider at another facility     Medications Ordered in ED Medications - No data to display  ED Course  I have reviewed the triage vital signs and the nursing notes.  Pertinent labs & imaging results that were available during my care of the patient were reviewed by me and considered in my medical decision making (see chart for details).    MDM Rules/Calculators/A&P                            70 year old male with a history of diabetes, ESRD on home dialysis Sunday Monday Wednesday Thursday, hypertension, hyperlipidemia, atrial flutter not on anticoagulation, prostate cancer, hematuria related to radiation cystitis [facility], and receiving hyperbaric oxygen therapy, presents with concern for anemia and fatigue.  Hemoglobin here 6.6.  Other labs consistent with his CKD, however no other significant abnormalities.   Symptoms consistent with symptomatic anemia.  Suspect this is secondary to his hematuria from radiation cystitis.  He requires admission for blood transfusion in the setting of being a dialysis patient who is due for dialysis tomorrow.  At this facility, we are unable to perform a type and cross or get type and cross blood.  Plan is for admission to [facility] for blood transfusion and continued monitoring.  Discussed with Dr. [name] of urology, recommends hospitalist team contact urology when he arrived to the hospital for evaluation. He remains hemodynamically stable in the emergency department.    Final Clinical Impression(s) / ED Diagnoses Final diagnoses:  Anemia, unspecified type    Rx / DC Orders ED Discharge Orders     None        Gareth Morgan, MD 09/13/20 1617    Gareth Morgan, MD 10/09/20 [phone]

## 2020-09-13 NOTE — Progress Notes (Addendum)
[name], [name] ([id number]) Visit Report for 09/12/2020 Arrival Information Details Patient Name: Date of Service: [name], [name] 09/12/2020 7:30 A M Medical Record Number: [id number] Patient Account Number: [id number] Date of Birth/Sex: Treating RN: 15-Aug-1950 (70 y.o. [name] Primary Care [name]: [name], [place] Other Clinician: Referring [name]: Treating [name]/Extender: [name], NA [name]: 5 Visit Information History Since Last Visit Added or deleted any medications: No Patient Arrived: Ambulatory Any new allergies or adverse reactions: No Arrival Time: 07:38 Had a fall or experienced change in No Accompanied By: alone activities of daily living that may affect Transfer Assistance: None risk of falls: Patient Identification Verified: Yes Signs or symptoms of abuse/neglect since last visito No Secondary Verification Process Completed: Yes Hospitalized since last visit: No Patient Requires Transmission-Based Precautions: No Implantable device outside of the clinic excluding No Patient Has Alerts: Yes cellular tissue based products placed in the center Patient Alerts: BP Right Arm Only since last visit: Pain Present Now: No Electronic Signature(s) Signed: 09/13/2020 5:57:05 PM By: Jon Hurst RN, BSN Entered By: [name] on 09/12/2020 08:10:05 -------------------------------------------------------------------------------- Clinic Level of Care Assessment Details Patient Name: Date of Service: [name], [name] 09/12/2020 7:30 A M Medical Record Number: [id number] Patient Account Number: [id number] Date of Birth/Sex: Treating RN: 1951-02-04 (70 y.o. [name] Primary Care Bellamy Judson: [name], [place] Other Clinician: Referring [name]: Treating [name]/Extender: [name], NA [name]: 5 Clinic Level of Care Assessment Items TOOL 4 Quantity Score X- 1 0 Use when only an EandM is performed on  FOLLOW-UP visit ASSESSMENTS - Nursing Assessment / Reassessment X- 1 10 Reassessment of Co-morbidities (includes updates in patient status) X- 1 5 Reassessment of Adherence to [name] Plan ASSESSMENTS - Wound and Skin A ssessment / Reassessment []  - 0 Simple Wound Assessment / Reassessment - one wound []  - 0 Complex Wound Assessment / Reassessment - multiple wounds []  - 0 Dermatologic / Skin Assessment (not related to wound area) ASSESSMENTS - Focused Assessment []  - 0 Circumferential Edema Measurements - multi extremities []  - 0 Nutritional Assessment / Counseling / Intervention []  - 0 Lower Extremity Assessment (monofilament, tuning fork, pulses) []  - 0 Peripheral Arterial Disease Assessment (using hand held doppler) ASSESSMENTS - Ostomy and/or Continence Assessment and Care []  - 0 Incontinence Assessment and Management []  - 0 Ostomy Care Assessment and Management (repouching, etc.) PROCESS - Coordination of Care X - Simple Patient / Family Education for ongoing care 1 15 []  - 0 Complex (extensive) Patient / Family Education for ongoing care X- 1 10 Staff obtains Programmer, systems, Records, T Results / Process Orders est []  - 0 Staff telephones HHA, Nursing Homes / Clarify orders / etc []  - 0 Routine Transfer to another Facility (non-emergent condition) []  - 0 Routine Hospital Admission (non-emergent condition) []  - 0 New Admissions / Biomedical engineer / Ordering NPWT Apligraf, etc. , []  - 0 Emergency Hospital Admission (emergent condition) X- 1 10 Simple Discharge Coordination []  - 0 Complex (extensive) Discharge Coordination PROCESS - Special Needs []  - 0 Pediatric / Minor Patient Management []  - 0 Isolation Patient Management []  - 0 Hearing / Language / Visual special needs []  - 0 Assessment of Community assistance (transportation, D/C planning, etc.) []  - 0 Additional assistance / Altered mentation []  - 0 Support Surface(s) Assessment (bed, cushion,  seat, etc.) INTERVENTIONS - Wound Cleansing / Measurement []  - 0 Simple Wound Cleansing - one wound []  - 0 Complex Wound  Cleansing - multiple wounds []  - 0 Wound Imaging (photographs - any number of wounds) []  - 0 Wound Tracing (instead of photographs) []  - 0 Simple Wound Measurement - one wound []  - 0 Complex Wound Measurement - multiple wounds INTERVENTIONS - Wound Dressings []  - 0 Small Wound Dressing one or multiple wounds []  - 0 Medium Wound Dressing one or multiple wounds []  - 0 Large Wound Dressing one or multiple wounds []  - 0 Application of Medications - topical []  - 0 Application of Medications - injection INTERVENTIONS - Miscellaneous []  - 0 External ear exam []  - 0 Specimen Collection (cultures, biopsies, blood, body fluids, etc.) []  - 0 Specimen(s) / Culture(s) sent or taken to Lab for analysis []  - 0 Patient Transfer (multiple staff / Civil Service fast streamer / Similar devices) []  - 0 Simple Staple / Suture removal (25 or less) []  - 0 Complex Staple / Suture removal (26 or more) []  - 0 Hypo / Hyperglycemic Management (close monitor of Blood Glucose) []  - 0 Ankle / Brachial Index (ABI) - do not check if billed separately X- 1 5 Vital Signs Has the patient been seen at the hospital within the last three years: Yes Total Score: 55 Level Of Care: New/Established - Level 2 Electronic Signature(s) Signed: 09/13/2020 5:57:05 PM By: Jon Hurst RN, BSN Entered By: [name] on 09/12/2020 08:13:14 -------------------------------------------------------------------------------- [place] Details Patient Name: Date of Service: [name], [name] 09/12/2020 7:30 A M Medical Record Number: [id number] Patient Account Number: [id number] Date of Birth/Sex: Treating RN: 1950/08/23 (70 y.o. [name] Primary Care Rhanda Lemire: [name], [place] Other Clinician: Referring Destani Wamser: Treating Yalonda Sample/Extender: [name], NA Jon LIE Weeks in  [name]: 5 Multidisciplinary Care Plan reviewed with physician Active Inactive Electronic Signature(s) Signed: 01/04/2021 4:51:55 PM By: Jon Hurst RN, BSN Previous Signature: 09/13/2020 5:57:05 PM Version By: Jon Hurst RN, BSN Entered By: [name] on 11/13/2020 11:17:11 -------------------------------------------------------------------------------- Pain Assessment Details Patient Name: Date of Service: [name], [place] 09/12/2020 7:30 A M Medical Record Number: [id number] Patient Account Number: [id number] Date of Birth/Sex: Treating RN: September 24, 1950 (70 y.o. [name] Primary Care Lorelie Biermann: [name], [place] Other Clinician: Referring Blanche Scovell: Treating Criselda Starke/Extender: [name], NA [name]: 5 Active Problems Location of Pain Severity and Description of Pain Patient Has Paino No Site Locations Pain Management and Medication Current Pain Management: Electronic Signature(s) Signed: 09/13/2020 5:57:05 PM By: Jon Hurst RN, BSN Entered By: [name] on 09/12/2020 08:10:31 -------------------------------------------------------------------------------- Patient/Caregiver Education Details Patient Name: Date of Service: [name], Jeffrey 6/7/2022andnbsp7:30 A M Medical Record Number: [id number] Patient Account Number: [id number] Date of Birth/Gender: Treating RN: 11-05-1950 (70 y.o. [name] Primary Care Physician: [name], [place] Other Clinician: Referring Physician: Treating Physician/Extender: [name], NA [name]: 5 Education Assessment Education Provided To: Patient Education Topics Provided Hyperbaric Oxygenation: Methods: Explain/Verbal Responses: State content correctly Electronic Signature(s) Signed: 09/13/2020 5:57:05 PM By: Jon Hurst RN, BSN Entered By: [name] on 09/12/2020  08:12:57 -------------------------------------------------------------------------------- [place] Details Patient Name: Date of Service: [name], [place] 09/12/2020 7:30 A M Medical Record Number: [id number] Patient Account Number: [id number] Date of Birth/Sex: Treating RN: 09-Sep-1950 (70 y.o. [name] Primary Care Ismael Treptow: [name], [place] Other Clinician: Referring Indica Marcott: Treating Perrin Eddleman/Extender: [name], NA [name]: 5 Vital Signs Time Taken: 07:38 Temperature (F): 98.0 Height (in): 70 Pulse (bpm): 69 Weight (lbs): 195 Respiratory Rate (breaths/min): 18  Body Mass Index (BMI): 28 Blood Pressure (mmHg): 127/47 Reference Range: 80 - 120 mg / dl Electronic Signature(s) Signed: 09/13/2020 5:57:05 PM By: Jon Hurst RN, BSN Entered By: [name] on 09/12/2020 08:10:26

## 2020-09-13 NOTE — Progress Notes (Signed)
SELESTINO, NILA ([id number]) Visit Report for 09/07/2020 Arrival Information Details Patient Name: Date of Service: BASHAR, MILAM 09/07/2020 8:00 A M Medical Record Number: [id number] Patient Account Number: [id number] Date of Birth/Sex: Treating RN: 1950-09-08 (70 y.o. [name] Primary Care Lidia Clavijo: [name], [place] Other Clinician: Referring Tinie Mcgloin: Treating Zhanae Proffit/Extender: [name], [name] Weeks in Treatment: 4 Visit Information History Since Last Visit Added or deleted any medications: No Patient Arrived: [name] Any new allergies or adverse reactions: No Arrival Time: 07:44 Had a fall or experienced change in No Accompanied By: alone activities of daily living that may affect Transfer Assistance: None risk of falls: Patient Identification Verified: Yes Signs or symptoms of abuse/neglect since last visito No Secondary Verification Process Completed: Yes Hospitalized since last visit: No Patient Requires Transmission-Based Precautions: No Implantable device outside of the clinic excluding No Patient Has Alerts: Yes cellular tissue based products placed in the center Patient Alerts: BP Right Arm Only since last visit: Pain Present Now: No Electronic Signature(s) Signed: 09/13/2020 5:57:05 PM By: Levan Hurst RN, BSN Entered By: Levan Hurst on 09/07/2020 08:59:34 -------------------------------------------------------------------------------- Encounter Discharge Information Details Patient Name: Date of Service: [name], [name] 09/07/2020 8:00 A M Medical Record Number: [id number] Patient Account Number: [id number] Date of Birth/Sex: Treating RN: 10/05/1950 (70 y.o. [name] Primary Care Quenesha Douglass: [name], [place] Other Clinician: Referring Zenith Lamphier: Treating Ranelle Auker/Extender: [name], [name] Weeks in Treatment: 4 Encounter Discharge Information Items Discharge Condition: Stable Ambulatory Status: [name] Discharge Destination:  Home Transportation: Private Auto Accompanied By: alone Schedule Follow-up Appointment: Yes Clinical Summary of Care: Patient Declined Electronic Signature(s) Signed: 09/13/2020 5:57:05 PM By: Levan Hurst RN, BSN Entered By: Levan Hurst on 09/07/2020 10:56:35 -------------------------------------------------------------------------------- [place] Details Patient Name: Date of Service: [name], [place] 09/07/2020 8:00 [place] Record Number: [id number] Patient Account Number: [id number] Date of Birth/Sex: Treating RN: 05-Feb-1951 (70 y.o. [name] Primary Care Jerelene Salaam: [name], [place] Other Clinician: Referring Ansleigh Safer: Treating Pearlie Nies/Extender: [name], [name] Weeks in Treatment: 4 Vital Signs Time Taken: 07:44 Temperature (F): 98.0 Height (in): 70 Pulse (bpm): 68 Weight (lbs): 195 Respiratory Rate (breaths/min): 18 Body Mass Index (BMI): 28 Blood Pressure (mmHg): 113/47 Capillary Blood Glucose (mg/dl): 190 Reference Range: 80 - 120 mg / dl Electronic Signature(s) Signed: 09/13/2020 5:57:05 PM By: Levan Hurst RN, BSN Entered By: Levan Hurst on 09/07/2020 08:59:56

## 2020-09-13 NOTE — ED Notes (Signed)
Phone Handoff Report provided to [employer], spoke with [employer]

## 2020-09-13 NOTE — ED Notes (Signed)
HAS DIALYSIS CATHETER IN LUE, POSITIVE THRILL AND BRUIT EASILY NOTED. (restricted extremity bractlet applied)

## 2020-09-13 NOTE — ED Notes (Signed)
States his Hgb is low (6.4), states he feels weak, tired, denies shortness of breath or having any chest pain. States he has a urinary catheter in place, leg bag, which he states there is blood in his catheter bag. Placed on cont cardiac monitoring.

## 2020-09-14 ENCOUNTER — Encounter (HOSPITAL_BASED_OUTPATIENT_CLINIC_OR_DEPARTMENT_OTHER): Payer: Medicare Other | Admitting: Internal Medicine

## 2020-09-14 DIAGNOSIS — D649 Anemia, unspecified: Secondary | ICD-10-CM

## 2020-09-14 LAB — CBC
HCT: 24.1 % — ABNORMAL LOW (ref 39.0–52.0)
Hemoglobin: 7.5 g/dL — ABNORMAL LOW (ref 13.0–17.0)
MCH: 27.3 pg (ref 26.0–34.0)
MCHC: 31.1 g/dL (ref 30.0–36.0)
MCV: 87.6 fL (ref 80.0–100.0)
Platelets: 120 10*3/uL — ABNORMAL LOW (ref 150–400)
RBC: 2.75 MIL/uL — ABNORMAL LOW (ref 4.22–5.81)
RDW: 18.3 % — ABNORMAL HIGH (ref 11.5–15.5)
WBC: 5.2 10*3/uL (ref 4.0–10.5)
nRBC: 0 % (ref 0.0–0.2)

## 2020-09-14 LAB — COMPREHENSIVE METABOLIC PANEL
ALT: 13 U/L (ref 0–44)
AST: 13 U/L — ABNORMAL LOW (ref 15–41)
Albumin: 3.2 g/dL — ABNORMAL LOW (ref 3.5–5.0)
Alkaline Phosphatase: 85 U/L (ref 38–126)
Anion gap: 10 (ref 5–15)
BUN: 62 mg/dL — ABNORMAL HIGH (ref 8–23)
CO2: 27 mmol/L (ref 22–32)
Calcium: 8.4 mg/dL — ABNORMAL LOW (ref 8.9–10.3)
Chloride: 100 mmol/L (ref 98–111)
Creatinine, Ser: 6.19 mg/dL — ABNORMAL HIGH (ref 0.61–1.24)
GFR, Estimated: 9 mL/min — ABNORMAL LOW (ref >60)
Glucose, Bld: 136 mg/dL — ABNORMAL HIGH (ref 70–99)
Potassium: 4.4 mmol/L (ref 3.5–5.1)
Sodium: 137 mmol/L (ref 135–145)
Total Bilirubin: 0.9 mg/dL (ref 0.3–1.2)
Total Protein: 5.8 g/dL — ABNORMAL LOW (ref 6.5–8.1)

## 2020-09-14 LAB — TYPE AND SCREEN
ABO/RH(D): O POS
Antibody Screen: NEGATIVE
Unit division: 0

## 2020-09-14 LAB — GLUCOSE, CAPILLARY: Glucose-Capillary: 138 mg/dL — ABNORMAL HIGH (ref 70–99)

## 2020-09-14 LAB — BPAM RBC
Blood Product Expiration Date: 202207092359
ISSUE DATE / TIME: 202206082326
Unit Type and Rh: 5100

## 2020-09-14 MED ORDER — PANTOPRAZOLE SODIUM 40 MG PO TBEC: 40.0000 mg | DELAYED_RELEASE_TABLET | Freq: Two times a day (BID) | ORAL | Status: DC

## 2020-09-14 MED ORDER — CHLORHEXIDINE GLUCONATE CLOTH 2 % EX PADS: 6.0000 | MEDICATED_PAD | Freq: Every day | CUTANEOUS | Status: DC

## 2020-09-14 NOTE — Discharge Summary (Signed)
PATIENT DETAILS Name: [name] Age: 70 y.o. Sex: male Date of Birth: 06-19-1950 MRN: [id number]. Admitting Physician: Orma Flaming, MD JKD:TOIZT, Henri Medal, FNP  Admit Date: 09/13/2020 Discharge date: 09/14/2020  Recommendations for Outpatient Follow-up:  Follow up with PCP in 1-2 weeks Please obtain CMP/CBC in one week Please ensure follow up with nephrology   Admitted From:  Home  Disposition: [place]: No  Equipment/Devices: None  Discharge Condition Stable  CODE STATUS: FULL CODE  Diet recommendation:  Diet Order             Diet - low sodium heart healthy           Diet heart healthy/carb modified Room service appropriate? Yes; Fluid consistency: Thin  Diet effective now                    Brief Summary: See H&P, Labs, Consult and Test reports for all details in brief, patient is a 70 year old male with history of ESRD on home hemodialysis, prostate cancer-status post radiation therapy-complicated by radiation cystitis causing intermittent hematuria and blood loss anemia-presented to the ED after being referred by urology for a hemoglobin of 6.6.  See below for further details.  Brief Hospital Course: Symptomatic anemia-due to acute blood loss in the setting of hematuria from radiation cystitis: Has chronic anemia at baseline-likely from ESRD-hemoglobin ranges from 7-to 8-presented with a hemoglobin of 6.6 in the setting of hematuria.  Patient was admitted-transfused 1 unit of PRBC-urology was consulted.  Thankfully his urine is clear-hematuria has resolved at the time of discharge.  Patient feels significantly better this morning after transfusion-and thinks he is back to his baseline.  Urology has evaluated the patient today-recommendations are for discharge home-they will arrange for follow-up for a voiding trial-hence we will leave Foley catheter in place at the time of discharge.  Patient will continue with his regular scheduled  erythropoietin injections in the outpatient setting.  ESRD on hemodialysis at home: Resume per usual schedule-I have instructed patient to ensure he follows up with his primary nephrologist for further continued care.  Radiation cystitis: Getting hyperbaric oxygen treatments-have recommended the patient that he talk with the hyperbaric clinic-to let them know that he tested positive for COVID-19.  COVID-19 infection: Incidental finding-fully vaccinated-doubt he requires any specific treatment  Rest of his medical conditions were stable during his short overnight hospital stay.  RN pressure injury documentation: Pressure Injury 06/28/20 Coccyx Mid Stage 2 -  Partial thickness loss of dermis presenting as a shallow open injury with a red, pink wound bed without slough. (Active)  06/28/20 2230  Location: Coccyx  Location Orientation: Mid  Staging: Stage 2 -  Partial thickness loss of dermis presenting as a shallow open injury with a red, pink wound bed without slough.  Wound Description (Comments):   Present on Admission:     Procedures/ None  Discharge Diagnoses:  Principal Problem:   Symptomatic anemia Active Problems:   Hypercholesteremia   Neuropathy (HCC)   Gout   HTN (hypertension)   Hypothyroidism   CAD (coronary artery disease)   Diabetes mellitus ([place])   Prostate cancer (HCC)   ESRD (end stage renal disease) ([place])   COVID-19 virus infection   Discharge Instructions:  Activity:  As tolerated   Discharge Instructions     Call MD for:  difficulty breathing, headache or visual disturbances   Complete by: As directed    Call MD for:  extreme fatigue   Complete  by: As directed    Call MD for:  persistant dizziness or light-headedness   Complete by: As directed    Diet - low sodium heart healthy   Complete by: As directed    Discharge instructions   Complete by: As directed    Follow with Primary MD  Lorenda Hatchet, FNP in 1-2 weeks  Please get a complete  blood count and chemistry panel checked by your Primary MD at your next visit, and again as instructed by your Primary MD.  Get Medicines reviewed and adjusted: Please take all your medications with you for your next visit with your Primary MD  Laboratory/radiological data: Please request your Primary MD to go over all hospital tests and procedure/radiological results at the follow up, please ask your Primary MD to get all Hospital records sent to his/her office.  In some cases, they will be blood work, cultures and biopsy results pending at the time of your discharge. Please request that your primary care M.D. follows up on these results.  Also Note the following: If you experience worsening of your admission symptoms, develop shortness of breath, life threatening emergency, suicidal or homicidal thoughts you must seek medical attention immediately by calling 911 or calling your MD immediately  if symptoms less severe.  You must read complete instructions/literature along with all the possible adverse reactions/side effects for all the Medicines you take and that have been prescribed to you. Take any new Medicines after you have completely understood and accpet all the possible adverse reactions/side effects.   Do not drive when taking Pain medications or sleeping medications (Benzodaizepines)  Do not take more than prescribed Pain, Sleep and Anxiety Medications. It is not advisable to combine anxiety,sleep and pain medications without talking with your primary care practitioner  Special Instructions: If you have smoked or chewed Tobacco  in the last 2 yrs please stop smoking, stop any regular Alcohol  and or any Recreational drug use.  Wear Seat belts while driving.  Please note: You were cared for by a hospitalist during your hospital stay. Once you are discharged, your primary care physician will handle any further medical issues. Please note that NO REFILLS for any discharge medications  will be authorized once you are discharged, as it is imperative that you return to your primary care physician (or establish a relationship with a primary care physician if you do not have one) for your post hospital discharge needs so that they can reassess your need for medications and monitor your lab values.   1. 7 days of isolation  2. Follow with your nephrologist as previously scheduled  3. Resume Home Hemodialysis as scheduled   Increase activity slowly   Complete by: As directed    No wound care   Complete by: As directed       Allergies as of 09/14/2020       Reactions   Mushroom Extract Complex Nausea Only        Medication List     TAKE these medications    acetaminophen 500 MG tablet Commonly known as: TYLENOL Take 1,000-2,000 mg by mouth 2 (two) times daily as needed for moderate pain.   allopurinol 100 MG tablet Commonly known as: ZYLOPRIM Take 200 mg by mouth daily.   calcitRIOL 0.5 MCG capsule Commonly known as: ROCALTROL Take 0.5 mcg by mouth daily.   carvedilol 12.5 MG tablet Commonly known as: COREG Take 12.5 mg by mouth in the morning and at bedtime.  DIALYVITE 800 WITH ZINC 0.8 MG Tabs Take 1 tablet by mouth daily.   levothyroxine 112 MCG tablet Commonly known as: SYNTHROID Take 112 mcg by mouth daily before breakfast. BRAND NAME SYNTHROID   losartan 100 MG tablet Commonly known as: COZAAR Take 100 mg by mouth daily.   Lubricant Eye Drops 0.4-0.3 % Soln Generic drug: Polyethyl Glycol-Propyl Glycol Place 1-2 drops into both eyes 3 (three) times daily as needed (dry/irritated eyes.).   OneTouch Delica Plus [id number] Misc   oxybutynin 5 MG tablet Commonly known as: DITROPAN Take 5 mg by mouth daily.   Ozempic (0.25 or 0.5 MG/DOSE) 2 MG/1.5ML Sopn Generic drug: Semaglutide(0.25 or 0.5MG /DOS) Inject 0.5 mg into the skin once a week.   pantoprazole 40 MG tablet Commonly known as: PROTONIX Take 1 tablet (40 mg total) by mouth 2 (two)  times daily.   pregabalin 150 MG capsule Commonly known as: LYRICA Take 1 capsule (150 mg total) by mouth in the morning and at bedtime.   prochlorperazine 5 MG tablet Commonly known as: COMPAZINE Take 1 tablet (5 mg total) by mouth 2 (two) times daily.   rosuvastatin 10 MG tablet Commonly known as: CRESTOR Take 10 mg by mouth daily.   sevelamer carbonate 800 MG tablet Commonly known as: RENVELA Take 800 mg by mouth 3 (three) times daily.        Follow-up Information     Lorenda Hatchet, FNP. Schedule an appointment as soon as possible for a visit in 1 week(s).   Specialty: Family Medicine Contact information: [place] [address] [phone]                Allergies  Allergen Reactions   Mushroom Extract Complex Nausea Only      Consultations: Urology   Other Procedures/Studies: No results found.   TODAY-DAY OF DISCHARGE:  Subjective:   [name] today has no headache,no chest abdominal pain,no new weakness tingling or numbness, feels much better wants to go home today.   Objective:   Blood pressure (!) 173/77, pulse 77, temperature 98.7 F (37.1 C), temperature source Oral, resp. rate 18, height 5\' 10"  (1.778 m), weight 88.5 kg, SpO2 98 %.  Intake/Output Summary (Last 24 hours) at 09/14/2020 0756 Last data filed at 09/14/2020 8850 Gross per 24 hour  Intake 326.61 ml  Output 400 ml  Net -73.39 ml   Filed Weights   09/13/20 1130  Weight: 88.5 kg    Exam: Awake Alert, Oriented *3, No new F.N deficits, Normal affect [place].AT,PERRAL Supple Neck,No JVD, No cervical lymphadenopathy appriciated.  Symmetrical Chest wall movement, Good air movement bilaterally, CTAB RRR,No Gallops,Rubs or new Murmurs, No Parasternal Heave +ve B.Sounds, Abd Soft, Non tender, No organomegaly appriciated, No rebound -guarding or rigidity. No Cyanosis, Clubbing or edema, No new Rash or bruise   PERTINENT RADIOLOGIC STUDIES: No  results found.   PERTINENT LAB RESULTS: CBC: Recent Labs    09/13/20 1155 09/13/20 2049 09/14/20 0423  WBC 4.0  --  5.2  HGB 6.6* 7.1* 7.5*  HCT 21.4* 22.5* 24.1*  PLT 128*  --  120*   CMET CMP     Component Value Date/Time   NA 137 09/14/2020 0423   NA 138 03/24/2020 1203   K 4.4 09/14/2020 0423   CL 100 09/14/2020 0423   CO2 27 09/14/2020 0423   GLUCOSE 136 (H) 09/14/2020 0423   BUN 62 (H) 09/14/2020 0423   BUN 27 03/24/2020 1203  CREATININE 6.19 (H) 09/14/2020 0423   CREATININE 5.97 (H) 02/28/2020 1522   CALCIUM 8.4 (L) 09/14/2020 0423   PROT 5.8 (L) 09/14/2020 0423   PROT 5.9 (L) 02/06/2018 0859   ALBUMIN 3.2 (L) 09/14/2020 0423   AST 13 (L) 09/14/2020 0423   ALT 13 09/14/2020 0423   ALKPHOS 85 09/14/2020 0423   BILITOT 0.9 09/14/2020 0423   GFRNONAA 9 (L) 09/14/2020 0423   GFRAA 15 (L) 03/24/2020 1203    GFR Estimated Creatinine Clearance: 12.3 mL/min (A) (by C-G formula based on SCr of 6.19 mg/dL (H)). No results for input(s): LIPASE, AMYLASE in the last 72 hours. No results for input(s): CKTOTAL, CKMB, CKMBINDEX, TROPONINI in the last 72 hours. Invalid input(s): POCBNP No results for input(s): DDIMER in the last 72 hours. No results for input(s): HGBA1C in the last 72 hours. No results for input(s): CHOL, HDL, LDLCALC, TRIG, CHOLHDL, LDLDIRECT in the last 72 hours. Recent Labs    09/13/20 2049  TSH 4.361   Recent Labs    09/13/20 1155  VITAMINB12 276  FOLATE 28.5  FERRITIN 263  TIBC 347  IRON 34*  RETICCTPCT 3.0   Coags: Recent Labs    09/13/20 1155  INR 1.1   Microbiology: Recent Results (from the past 240 hour(s))  Resp Panel by RT-PCR (Flu A&B, Covid) Nasopharyngeal Swab     Status: Abnormal   Collection Time: 09/13/20  4:16 PM   Specimen: Nasopharyngeal Swab; Nasopharyngeal(NP) swabs in vial transport medium  Result Value Ref Range Status   SARS Coronavirus 2 by RT PCR POSITIVE (A) NEGATIVE Final    Comment: RESULT CALLED TO,  READ BACK BY AND VERIFIED WITH: K ZULETA,RN 1705 09/13/2020 DBRADLEY (NOTE) SARS-CoV-2 target nucleic acids are DETECTED.  The SARS-CoV-2 RNA is generally detectable in upper respiratory specimens during the acute phase of infection. Positive results are indicative of the presence of the identified virus, but do not rule out bacterial infection or co-infection with other pathogens not detected by the test. Clinical correlation with patient history and other diagnostic information is necessary to determine patient infection status. The expected result is Negative.  Fact Sheet for Patients: EntrepreneurPulse.com.au  Fact Sheet for Healthcare Providers: IncredibleEmployment.be  This test is not yet approved or cleared by the [place] FDA and  has been authorized for detection and/or diagnosis of SARS-CoV-2 by FDA under an Emergency Use Authorization (EUA).  This EUA will remain in effect (meaning this test can b e used) for the duration of  the COVID-19 declaration under Section 564(b)(1) of the Act, 21 U.S.C. section 360bbb-3(b)(1), unless the authorization is terminated or revoked sooner.     Influenza A by PCR NEGATIVE NEGATIVE Final   Influenza B by PCR NEGATIVE NEGATIVE Final    Comment: (NOTE) The Xpert Xpress SARS-CoV-2/FLU/RSV plus assay is intended as an aid in the diagnosis of influenza from Nasopharyngeal swab specimens and should not be used as a sole basis for treatment. Nasal washings and aspirates are unacceptable for Xpert Xpress SARS-CoV-2/FLU/RSV testing.  Fact Sheet for Patients: EntrepreneurPulse.com.au  Fact Sheet for Healthcare Providers: IncredibleEmployment.be  This test is not yet approved or cleared by the [place] FDA and has been authorized for detection and/or diagnosis of SARS-CoV-2 by FDA under an Emergency Use Authorization (EUA). This EUA will remain in effect  (meaning this test can be used) for the duration of the COVID-19 declaration under Section 564(b)(1) of the Act, 21 U.S.C. section 360bbb-3(b)(1), unless the authorization is terminated or  revoked.  Performed at [facility], [address], [place], [place] [address]     FURTHER DISCHARGE INSTRUCTIONS:  Get Medicines reviewed and adjusted: Please take all your medications with you for your next visit with your Primary MD  Laboratory/radiological data: Please request your Primary MD to go over all hospital tests and procedure/radiological results at the follow up, please ask your Primary MD to get all Hospital records sent to his/her office.  In some cases, they will be blood work, cultures and biopsy results pending at the time of your discharge. Please request that your primary care M.D. goes through all the records of your hospital data and follows up on these results.  Also Note the following: If you experience worsening of your admission symptoms, develop shortness of breath, life threatening emergency, suicidal or homicidal thoughts you must seek medical attention immediately by calling 911 or calling your MD immediately  if symptoms less severe.  You must read complete instructions/literature along with all the possible adverse reactions/side effects for all the Medicines you take and that have been prescribed to you. Take any new Medicines after you have completely understood and accpet all the possible adverse reactions/side effects.   Do not drive when taking Pain medications or sleeping medications (Benzodaizepines)  Do not take more than prescribed Pain, Sleep and Anxiety Medications. It is not advisable to combine anxiety,sleep and pain medications without talking with your primary care practitioner  Special Instructions: If you have smoked or chewed Tobacco  in the last 2 yrs please stop smoking, stop any regular Alcohol  and or any Recreational drug  use.  Wear Seat belts while driving.  Please note: You were cared for by a hospitalist during your hospital stay. Once you are discharged, your primary care physician will handle any further medical issues. Please note that NO REFILLS for any discharge medications will be authorized once you are discharged, as it is imperative that you return to your primary care physician (or establish a relationship with a primary care physician if you do not have one) for your post hospital discharge needs so that they can reassess your need for medications and monitor your lab values.  Total Time spent coordinating discharge including counseling, education and face to face time equals 35 minutes.  SignedOren Binet 09/14/2020 7:56 AM

## 2020-09-14 NOTE — Discharge Instructions (Signed)
Person Under Monitoring Name: [name]  Location: [place] [address]   Infection Prevention Recommendations for Individuals Confirmed to have, or Being Evaluated for, 2019 Novel Coronavirus (COVID-19) Infection Who Receive Care at Home  Individuals who are confirmed to have, or are being evaluated for, COVID-19 should follow the prevention steps below until a healthcare provider or local or state health department says they can return to normal activities.  Stay home except to get medical care You should restrict activities outside your home, except for getting medical care. Do not go to work, school, or public areas, and do not use public transportation or taxis.  Call ahead before visiting your doctor Before your medical appointment, call the healthcare provider and tell them that you have, or are being evaluated for, COVID-19 infection. This will help the healthcare provider's office take steps to keep other people from getting infected. Ask your healthcare provider to call the local or state health department.  Monitor your symptoms Seek prompt medical attention if your illness is worsening (e.g., difficulty breathing). Before going to your medical appointment, call the healthcare provider and tell them that you have, or are being evaluated for, COVID-19 infection. Ask your healthcare provider to call the local or state health department.  Wear a facemask You should wear a facemask that covers your nose and mouth when you are in the same room with other people and when you visit a healthcare provider. People who live with or visit you should also wear a facemask while they are in the same room with you.  Separate yourself from other people in your home As much as possible, you should stay in a different room from other people in your home. Also, you should use a separate bathroom, if available.  Avoid sharing household items You should not share  dishes, drinking glasses, cups, eating utensils, towels, bedding, or other items with other people in your home. After using these items, you should wash them thoroughly with soap and water.  Cover your coughs and sneezes Cover your mouth and nose with a tissue when you cough or sneeze, or you can cough or sneeze into your sleeve. Throw used tissues in a lined trash can, and immediately wash your hands with soap and water for at least 20 seconds or use an alcohol-based hand rub.  Wash your [facility] your hands often and thoroughly with soap and water for at least 20 seconds. You can use an alcohol-based hand sanitizer if soap and water are not available and if your hands are not visibly dirty. Avoid touching your eyes, nose, and mouth with unwashed hands.   Prevention Steps for Caregivers and Household Members of Individuals Confirmed to have, or Being Evaluated for, COVID-19 Infection Being Cared for in the Home  If you live with, or provide care at home for, a person confirmed to have, or being evaluated for, COVID-19 infection please follow these guidelines to prevent infection:  Follow healthcare provider's instructions Make sure that you understand and can help the patient follow any healthcare provider instructions for all care.  Provide for the patient's basic needs You should help the patient with basic needs in the home and provide support for getting groceries, prescriptions, and other personal needs.  Monitor the patient's symptoms If they are getting sicker, call his or her medical provider and tell them that the patient has, or is being evaluated for, COVID-19 infection. This will help the healthcare provider's office  take steps to keep other people from getting infected. Ask the healthcare provider to call the local or state health department.  Limit the number of people who have contact with the patient If possible, have only one caregiver for the patient. Other  household members should stay in another home or place of residence. If this is not possible, they should stay in another room, or be separated from the patient as much as possible. Use a separate bathroom, if available. Restrict visitors who do not have an essential need to be in the home.  Keep older adults, very young children, and other sick people away from the patient Keep older adults, very young children, and those who have compromised immune systems or chronic health conditions away from the patient. This includes people with chronic heart, lung, or kidney conditions, diabetes, and cancer.  Ensure good ventilation Make sure that shared spaces in the home have good air flow, such as from an air conditioner or an opened window, weather permitting.  Wash your hands often Wash your hands often and thoroughly with soap and water for at least 20 seconds. You can use an alcohol based hand sanitizer if soap and water are not available and if your hands are not visibly dirty. Avoid touching your eyes, nose, and mouth with unwashed hands. Use disposable paper towels to dry your hands. If not available, use dedicated cloth towels and replace them when they become wet.  Wear a facemask and gloves Wear a disposable facemask at all times in the room and gloves when you touch or have contact with the patient's blood, body fluids, and/or secretions or excretions, such as sweat, saliva, sputum, nasal mucus, vomit, urine, or feces.  Ensure the mask fits over your nose and mouth tightly, and do not touch it during use. Throw out disposable facemasks and gloves after using them. Do not reuse. Wash your hands immediately after removing your facemask and gloves. If your personal clothing becomes contaminated, carefully remove clothing and launder. Wash your hands after handling contaminated clothing. Place all used disposable facemasks, gloves, and other waste in a lined container before disposing them with  other household waste. Remove gloves and wash your hands immediately after handling these items.  Do not share dishes, glasses, or other household items with the patient Avoid sharing household items. You should not share dishes, drinking glasses, cups, eating utensils, towels, bedding, or other items with a patient who is confirmed to have, or being evaluated for, COVID-19 infection. After the person uses these items, you should wash them thoroughly with soap and water.  Wash laundry thoroughly Immediately remove and wash clothes or bedding that have blood, body fluids, and/or secretions or excretions, such as sweat, saliva, sputum, nasal mucus, vomit, urine, or feces, on them. Wear gloves when handling laundry from the patient. Read and follow directions on labels of laundry or clothing items and detergent. In general, wash and dry with the warmest temperatures recommended on the label.  Clean all areas the individual has used often Clean all touchable surfaces, such as counters, tabletops, doorknobs, bathroom fixtures, toilets, phones, keyboards, tablets, and bedside tables, every day. Also, clean any surfaces that may have blood, body fluids, and/or secretions or excretions on them. Wear gloves when cleaning surfaces the patient has come in contact with. Use a diluted bleach solution (e.g., dilute bleach with 1 part bleach and 10 parts water) or a household disinfectant with a label that says EPA-registered for coronaviruses. To make a bleach  solution at home, add 1 tablespoon of bleach to 1 quart (4 cups) of water. For a larger supply, add  cup of bleach to 1 gallon (16 cups) of water. Read labels of cleaning products and follow recommendations provided on product labels. Labels contain instructions for safe and effective use of the cleaning product including precautions you should take when applying the product, such as wearing gloves or eye protection and making sure you have good ventilation  during use of the product. Remove gloves and wash hands immediately after cleaning.  Monitor yourself for signs and symptoms of illness Caregivers and household members are considered close contacts, should monitor their health, and will be asked to limit movement outside of the home to the extent possible. Follow the monitoring steps for close contacts listed on the symptom monitoring form.   ? If you have additional questions, contact your local health department or call the epidemiologist on call at [phone] (available 24/7). ? This guidance is subject to change. For the most up-to-date guidance from [facility], please refer to their website: [email or web address]

## 2020-09-14 NOTE — Progress Notes (Signed)
Francis Dowse to be D/C'd  per MD order.  Discussed with the patient and all questions fully answered.  VSS, Skin clean, dry and intact without evidence of skin break down, no evidence of skin tears noted.  IV catheter discontinued intact. Site without signs and symptoms of complications. Dressing and pressure applied.  An After Visit Summary was printed and given to the patient.  Pt to go home with Foley catheter and leg bag per urology--follow-up output study.  D/c education completed with patient/family including follow up instructions, medication list, d/c activities limitations if indicated, with other d/c instructions as indicated by MD - patient able to verbalize understanding, all questions fully answered.   Patient instructed to return to ED, call 911, or call MD for any changes in condition.   Patient to be escorted via [place], and D/C home via private auto.

## 2020-09-14 NOTE — Plan of Care (Signed)
  Problem: Education: Goal: Ability to describe self-care measures that may prevent or decrease complications (Diabetes Survival Skills Education) will improve Outcome: Progressing Goal: Individualized Educational Video(s) Outcome: Progressing   Problem: Education: Goal: Individualized Educational Video(s) Outcome: Progressing   Problem: Coping: Goal: Ability to adjust to condition or change in health will improve Outcome: Progressing   Problem: Fluid Volume: Goal: Ability to maintain a balanced intake and output will improve Outcome: Progressing   Problem: Health Behavior/Discharge Planning: Goal: Ability to identify and utilize available resources and services will improve Outcome: Progressing Goal: Ability to manage health-related needs will improve Outcome: Progressing   Problem: Metabolic: Goal: Ability to maintain appropriate glucose levels will improve Outcome: Progressing   Problem: Nutritional: Goal: Maintenance of adequate nutrition will improve Outcome: Progressing Goal: Progress toward achieving an optimal weight will improve Outcome: Progressing   Problem: Skin Integrity: Goal: Risk for impaired skin integrity will decrease Outcome: Progressing   Problem: Tissue Perfusion: Goal: Adequacy of tissue perfusion will improve Outcome: Progressing   

## 2020-09-14 NOTE — Progress Notes (Signed)
Patient ID: [name], male   DOB: 1950/04/17, 70 y.o.   MRN: [id number]    Subjective: Pt without new complaints.  Received one unit of blood overnight.  Did not require catheter irrigation.  Objective: Vital signs in last 24 hours: Temp:  [97.6 F (36.4 C)-99.3 F (37.4 C)] 98.7 F (37.1 C) (06/09 0235) Pulse Rate:  [58-84] 84 (06/09 0235) Resp:  [16-20] 17 (06/09 0235) BP: (135-176)/(63-82) 156/74 (06/09 0235) SpO2:  [93 %-100 %] 96 % (06/09 0200) Weight:  [88.5 kg] 88.5 kg (06/08 1130)  Intake/Output from previous day: 06/08 0701 - 06/09 0700 In: 326.6 [Blood:326.6] Out: 250 [Urine:250] Intake/Output this shift: Total I/O In: 326.6 [Blood:326.6] Out: 250 [Urine:250]  Physical Exam:  General: Alert and oriented GU: Urine now mostly clear in tubing  Lab Results: Recent Labs    09/13/20 1155 09/13/20 2049 09/14/20 0423  HGB 6.6* 7.1* 7.5*  HCT 21.4* 22.5* 24.1*   BMET Recent Labs    09/13/20 1155  NA 139  K 4.1  CL 98  CO2 29  GLUCOSE 138*  BUN 53*  CREATININE 5.25*  CALCIUM 8.6*     Studies/Results: No results found.  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  Urine now almost clear in tubing suggesting no ongoing bleeding.  Will leave decision about transfusing another unit with inpatient dialysis vs discharge home up to Dr. Rogers Blocker.  From a urologic perspective, there does not appear to be any ongoing bleeding and he is on chronic erythropoietin replacement so discharge home would be ok from a urologic perspective if Dr. Rogers Blocker agrees.  I will arrange him to have an outpatient follow up in our office early next week for a possible voiding trial.    LOS: 0 days   Dutch Gray 09/14/2020, 5:18 AM

## 2020-09-15 ENCOUNTER — Encounter (HOSPITAL_BASED_OUTPATIENT_CLINIC_OR_DEPARTMENT_OTHER): Payer: Medicare Other | Admitting: Internal Medicine

## 2020-09-15 LAB — HEMOGLOBIN A1C
Hgb A1c MFr Bld: 5.4 % (ref 4.8–5.6)
Mean Plasma Glucose: 108 mg/dL

## 2020-09-17 ENCOUNTER — Other Ambulatory Visit: Payer: Self-pay | Admitting: Internal Medicine

## 2020-09-17 DIAGNOSIS — R053 Chronic cough: Secondary | ICD-10-CM

## 2020-09-18 ENCOUNTER — Other Ambulatory Visit: Payer: Self-pay

## 2020-09-18 ENCOUNTER — Encounter (HOSPITAL_BASED_OUTPATIENT_CLINIC_OR_DEPARTMENT_OTHER): Payer: Medicare Other | Admitting: Internal Medicine

## 2020-09-18 DIAGNOSIS — N3041 Irradiation cystitis with hematuria: Secondary | ICD-10-CM | POA: Diagnosis not present

## 2020-09-18 LAB — GLUCOSE, CAPILLARY
Glucose-Capillary: 171 mg/dL — ABNORMAL HIGH (ref 70–99)
Glucose-Capillary: 148 mg/dL — ABNORMAL HIGH (ref 70–99)

## 2020-09-18 NOTE — Progress Notes (Signed)
[name], [name] ([id number]) Visit Report for 09/18/2020 Problem List Details Patient Name: Date of Service: [name], [name] 09/18/2020 8:00 A M Medical Record Number: [id number] Patient Account Number: [id number] Date of Birth/Sex: Treating RN: 1950/04/14 (70 y.o. M) Primary Care Provider: [name], [place] Other Clinician: Referring Provider: Treating Provider/Extender: [name], [name] Weeks in Treatment: 6 Active Problems ICD-10 Encounter Code Description Active Date MDM Diagnosis N30.41 Irradiation cystitis with hematuria 08/07/2020 No Yes Z85.46 Personal history of malignant neoplasm of prostate 08/07/2020 No Yes Inactive Problems Resolved Problems Electronic Signature(s) Signed: 09/18/2020 5:24:16 PM By: Linton Ham MD Entered By: Linton Ham on 09/18/2020 17:22:44 -------------------------------------------------------------------------------- SuperBill Details Patient Name: Date of Service: [name], [name] 09/18/2020 Medical Record Number: [id number] Patient Account Number: [id number] Date of Birth/Sex: Treating RN: 29-Oct-1950 (70 y.o. [name] Primary Care Provider: [name], [place] Other Clinician: Referring Provider: Treating Provider/Extender: [name], [name] Weeks in Treatment: 6 Diagnosis Coding ICD-10 Codes Code Description N30.41 Irradiation cystitis with hematuria Z85.46 Personal history of malignant neoplasm of prostate Facility Procedures CPT4 Code: [id number] Description: G0277-(Facility Use Only) HBOT full body chamber, 36min , Modifier: Quantity: 4 Physician Procedures : CPT4 Code Description Modifier [id number] - WC PHYS HYPERBARIC OXYGEN THERAPY ICD-10 Diagnosis Description N30.41 Irradiation cystitis with hematuria Quantity: 1 Electronic Signature(s) Signed: 09/18/2020 5:24:16 PM By: Linton Ham MD Signed: 09/18/2020 5:45:16 PM By: Deon Pilling Entered By: Deon Pilling on 09/18/2020 10:52:03

## 2020-09-18 NOTE — Progress Notes (Signed)
MAKOA, SATZ ([id number]) Visit Report for 09/18/2020 HBO Details Patient Name: Date of Service: Jon, [name] 09/18/2020 8:00 A M Medical Record Number: [id number] Patient Account Number: [id number] Date of Birth/Sex: Treating RN: 1950-07-30 (70 y.o. [name] Primary Care Consuello Lassalle: [name], [place] Other Clinician: Referring Masyn Fullam: Treating Quisha Mabie/Extender: [name], [name] Weeks in Treatment: 6 HBO Treatment Course Details Treatment Course Number: 1 Ordering Aleena Kirkeby: Linton Ham T Treatments Ordered: otal 40 HBO Treatment Start Date: 08/14/2020 HBO Indication: Late Effect of Radiation Notes: Radiation Cystitis HBO Treatment Details Treatment Number: 22 Patient Type: Outpatient Chamber Type: Monoplace Chamber Serial #: [id number] Treatment Protocol: 2.5 ATA with 90 minutes oxygen, with two 5 minute air breaks Treatment Details Compression Rate Down: 2.0 psi / minute De-Compression Rate Up: 2.5 psi / minute A breaks and breathing ir Compress Tx Pressure periods Decompress Decompress Begins Reached (leave unused spaces Begins Ends blank) Chamber Pressure (ATA 1 2.5 2.5 2.5 2.5 2.5 - - 2.5 1 ) Clock Time (24 hr) 08:06 08:17 08:47 08:52 09:22 09:27 - - 09:57 10:07 Treatment Length: 121 (minutes) Treatment Segments: 4 Vital Signs Capillary Blood Glucose Reference Range: 80 - 120 mg / dl HBO Diabetic Blood Glucose Intervention Range: <131 mg/dl or >249 mg/dl Time Vitals Blood Respiratory Capillary Blood Glucose Pulse Action Type: Pulse: Temperature: Taken: Pressure: Rate: Glucose (mg/dl): Meter #: Oximetry (%) Taken: Pre 07:47 136/53 74 18 98.7 171 Post 10:08 167/73 71 16 97.6 148 Treatment Response Treatment Toleration: Well Treatment Completion Status: Treatment Completed without Adverse Event [name] Notes Patient had a Foley catheter put in last week at the suggestion of urology. I think he follows up with them this week as well. He  tolerated treatment well today. Says the bleeding is a lot better Physician HBO Attestation: I certify that I supervised this HBO treatment in accordance with Medicare guidelines. A trained emergency response team is readily available per Yes hospital policies and procedures. Continue HBOT as ordered. Yes Electronic Signature(s) Signed: 09/18/2020 5:24:16 PM By: Linton Ham MD Entered By: Linton Ham on 09/18/2020 17:23:41 -------------------------------------------------------------------------------- HBO Safety Checklist Details Patient Name: Date of Service: [name], [name] 09/18/2020 8:00 A M Medical Record Number: [id number] Patient Account Number: [id number] Date of Birth/Sex: Treating RN: 1950/06/05 (70 y.o. [name] Primary Care Mariam Helbert: [name], [place] Other Clinician: Referring Shirell Struthers: Treating Triston Lisanti/Extender: [name], [name] Weeks in Treatment: 6 HBO Safety Checklist Items Safety Checklist Consent Form Signed Patient voided / foley secured and emptied this morning; [name], [name] When did you last eato sandwich Last dose of injectable or oral agent weekly on Friday Ostomy pouch emptied and vented if applicable NA All implantable devices assessed, documented and approved Intravenous access site secured and place NA Valuables secured Linens and cotton and cotton/polyester blend (less than 51% polyester) Personal oil-based products / skin lotions / body lotions removed NA Wigs or hairpieces removed NA Smoking or tobacco materials removed NA Books / newspapers / magazines / loose paper removed NA Cologne, aftershave, perfume and deodorant removed Jewelry removed (may wrap wedding band) Make-up removed NA Hair care products removed NA Battery operated devices (external) removed NA Heating patches and chemical warmers removed NA Titanium eyewear removed Nail polish cured greater than 10 hours NA Casting material  cured greater than 10 hours NA Hearing aids removed NA Loose dentures or partials removed NA Prosthetics have been removed NA Patient demonstrates correct use of air break device (if applicable)  Patient concerns have been addressed Patient grounding bracelet on and cord attached to chamber Specifics for Inpatients (complete in addition to above) Medication sheet sent with patient Intravenous medications needed or due during therapy sent with patient Drainage tubes (e.g. nasogastric tube or chest tube secured and vented) Endotracheal or Tracheotomy tube secured Cuff deflated of air and inflated with saline Airway suctioned Electronic Signature(s) Signed: 09/18/2020 5:45:16 PM By: Deon Pilling Entered By: Deon Pilling on 09/18/2020 10:51:05

## 2020-09-18 NOTE — Progress Notes (Signed)
CURLIE, MACKEN ([id number]) Visit Report for 09/18/2020 Arrival Information Details Patient Name: Date of Service: [name], [name] 09/18/2020 8:00 A M Medical Record Number: [id number] Patient Account Number: [id number] Date of Birth/Sex: Treating RN: November 26, 1950 (69 y.o. [name] Primary Care Vernia Teem: [name], [place] Other Clinician: Referring Jacquelene Kopecky: Treating Crystalann Korf/Extender: [name], [name] Weeks in Treatment: 6 Visit Information History Since Last Visit Added or deleted any medications: No Patient Arrived: [name] Any new allergies or adverse reactions: No Arrival Time: 07:47 Had a fall or experienced change in No Accompanied By: self activities of daily living that may affect Transfer Assistance: None risk of falls: Patient Identification Verified: Yes Signs or symptoms of abuse/neglect since last visito No Secondary Verification Process Completed: Yes Hospitalized since last visit: No Patient Requires Transmission-Based Precautions: No Implantable device outside of the clinic excluding No Patient Has Alerts: Yes cellular tissue based products placed in the center Patient Alerts: BP Right Arm Only since last visit: Pain Present Now: No Notes Per patient no longer urinated blood. Per patient going after HBO to schedule a follow up with urology concerning the foley catheter. Per patient urology sent patient to the ED on last visit related to low Hgb and needing transfusion. Electronic Signature(s) Signed: 09/18/2020 5:45:16 PM By: Deon Pilling Entered By: Deon Pilling on 09/18/2020 10:50:10 -------------------------------------------------------------------------------- Encounter Discharge Information Details Patient Name: Date of Service: [name], [place] 09/18/2020 8:00 A M Medical Record Number: [id number] Patient Account Number: [id number] Date of Birth/Sex: Treating RN: 04/16/1950 (70 y.o. [name] Primary Care Thorne Wirz: [name], [place] Other  Clinician: Referring Amdrew Oboyle: Treating Nino Amano/Extender: [name], [name] Weeks in Treatment: 6 Encounter Discharge Information Items Discharge Condition: Stable Ambulatory Status: Walker Discharge Destination: Home Transportation: Private Auto Accompanied By: self Schedule Follow-up Appointment: Yes Clinical Summary of Care: Electronic Signature(s) Signed: 09/18/2020 5:45:16 PM By: Deon Pilling Entered By: Deon Pilling on 09/18/2020 10:52:16 -------------------------------------------------------------------------------- Vitals Details Patient Name: Date of Service: [name], [place] 09/18/2020 8:00 A M Medical Record Number: [id number] Patient Account Number: [id number] Date of Birth/Sex: Treating RN: 07/30/1950 (70 y.o. [name] Primary Care Kyndal Heringer: [name], [place] Other Clinician: Referring Antoinette Haskett: Treating Yvaine Jankowiak/Extender: [name], [name] Weeks in Treatment: 6 Vital Signs Time Taken: 07:47 Temperature (F): 98.7 Height (in): 70 Pulse (bpm): 74 Weight (lbs): 195 Respiratory Rate (breaths/min): 18 Body Mass Index (BMI): 28 Blood Pressure (mmHg): 136/53 Capillary Blood Glucose (mg/dl): 171 Reference Range: 80 - 120 mg / dl Electronic Signature(s) Signed: 09/18/2020 5:45:16 PM By: Deon Pilling Entered By: Deon Pilling on 09/18/2020 10:50:26

## 2020-09-19 ENCOUNTER — Other Ambulatory Visit: Payer: Self-pay

## 2020-09-19 ENCOUNTER — Encounter (HOSPITAL_BASED_OUTPATIENT_CLINIC_OR_DEPARTMENT_OTHER): Payer: Medicare Other | Admitting: Internal Medicine

## 2020-09-19 DIAGNOSIS — N3041 Irradiation cystitis with hematuria: Secondary | ICD-10-CM | POA: Diagnosis not present

## 2020-09-19 LAB — GLUCOSE, CAPILLARY
Glucose-Capillary: 153 mg/dL — ABNORMAL HIGH (ref 70–99)
Glucose-Capillary: 163 mg/dL — ABNORMAL HIGH (ref 70–99)

## 2020-09-19 NOTE — Progress Notes (Signed)
[name], [name] ([id number]) Visit Report for 09/19/2020 Arrival Information Details Patient Name: Date of Service: [name], [name] 09/19/2020 8:00 A M Medical Record Number: [id number] Patient Account Number: [id number] Date of Birth/Sex: Treating RN: 1951-01-19 (70 y.o. [name] Primary Care [name]: [name], [place] Other Clinician: Referring [name]: Treating [name]/Extender: [name], NA [name] in Treatment: 6 Visit Information History Since Last Visit Added or deleted any medications: No Patient Arrived: [name] Any new allergies or adverse reactions: No Arrival Time: 07:45 Had a fall or experienced change in No Accompanied By: self activities of daily living that may affect Transfer Assistance: None risk of falls: Patient Identification Verified: Yes Signs or symptoms of abuse/neglect since last visito No Secondary Verification Process Completed: Yes Hospitalized since last visit: No Patient Requires Transmission-Based Precautions: No Implantable device outside of the clinic excluding No Patient Has Alerts: Yes cellular tissue based products placed in the center Patient Alerts: BP Right Arm Only since last visit: Pain Present Now: No Electronic Signature(s) Signed: 09/19/2020 6:05:55 PM By: [name] Entered By: [name] on 09/19/2020 08:33:24 -------------------------------------------------------------------------------- Encounter Discharge Information Details Patient Name: Date of Service: [name], [name] 09/19/2020 8:00 A M Medical Record Number: [id number] Patient Account Number: [id number] Date of Birth/Sex: Treating RN: [birth year] (70 y.o. [name] Primary Care [name]: [name], [place] Other Clinician: Referring [name]: Treating [name]/Extender: [name], NA [name] in Treatment: 6 Encounter Discharge Information Items Discharge Condition: Stable Ambulatory Status: Walker Discharge Destination:  Home Transportation: Private Auto Accompanied By: self Schedule Follow-up Appointment: Yes Clinical Summary of Care: Notes Per patient follow up with urology on Friday 09/22/2020 at 1115. MD made aware. Electronic Signature(s) Signed: 09/19/2020 6:05:55 PM By: [name] Entered By: [name] on 09/19/2020 12:39:58 -------------------------------------------------------------------------------- Vitals Details Patient Name: Date of Service: [name], [name] 09/19/2020 8:00 A M Medical Record Number: [id number] Patient Account Number: [id number] Date of Birth/Sex: Treating RN: Oct 01, 1950 (70 y.o. [name] Primary Care [name]: [name], [place] Other Clinician: Referring [name]: Treating [name]/Extender: [name], NA [name] in Treatment: 6 Vital Signs Time Taken: 07:45 Temperature (F): 98.2 Height (in): 70 Pulse (bpm): 79 Weight (lbs): 195 Respiratory Rate (breaths/min): 18 Body Mass Index (BMI): 28 Blood Pressure (mmHg): 134/56 Capillary Blood Glucose (mg/dl): 153 Reference Range: 80 - 120 mg / dl Electronic Signature(s) Signed: 09/19/2020 6:05:55 PM By: [name] Entered By: [name] on 09/19/2020 08:33:52

## 2020-09-19 NOTE — Progress Notes (Signed)
CICERO, NOY ([id number]) Visit Report for 09/19/2020 HBO Details Patient Name: Date of Service: [name], [name] 09/19/2020 8:00 A M Medical Record Number: [id number] Patient Account Number: [id number] Date of Birth/Sex: Treating RN: 02-Jan-1951 (70 y.o. [name] Primary Care [name]: [name], [place] Other Clinician: Referring [name]: Treating [name], [name] Weeks in Treatment: 6 HBO Treatment Course Details Treatment Course Number: 1 Ordering [name]: [name] T Treatments Ordered: otal 40 HBO Treatment Start Date: 08/14/2020 HBO Indication: Late Effect of Radiation Notes: Radiation Cystitis HBO Treatment Details Treatment Number: 23 Patient Type: Outpatient Chamber Type: Monoplace Chamber Serial #: [id number] Treatment Protocol: 2.5 ATA with 90 minutes oxygen, with two 5 minute air breaks Treatment Details Compression Rate Down: 2.0 psi / minute De-Compression Rate Up: 2.5 psi / minute A breaks and breathing ir Compress Tx Pressure periods Decompress Decompress Begins Reached (leave unused spaces Begins Ends blank) Chamber Pressure (ATA 1 2.5 2.5 2.5 2.5 2.5 - - 2.5 1 ) Clock Time (24 hr) 08:01 08:13 08:44 09:49 09:19 09:24 - - 09:54 10:04 Treatment Length: 123 (minutes) Treatment Segments: 4 Vital Signs Capillary Blood Glucose Reference Range: 80 - 120 mg / dl HBO Diabetic Blood Glucose Intervention Range: <131 mg/dl or >249 mg/dl Time Vitals Blood Respiratory Capillary Blood Glucose Pulse Action Type: Pulse: Temperature: Taken: Pressure: Rate: Glucose (mg/dl): Meter #: Oximetry (%) Taken: Pre 07:45 134/56 79 18 98.2 153 Post 10:05 160/70 70 20 97.4 163 Treatment Response Treatment Toleration: Well Treatment Completion Status: Treatment Completed without Adverse Event [name] Notes No concerns with treatment given Physician HBO Attestation: I certify that I supervised this HBO treatment in accordance with  Medicare guidelines. A trained emergency response team is readily available per Yes hospital policies and procedures. Continue HBOT as ordered. Yes Electronic Signature(s) Signed: 09/19/2020 4:29:11 PM By: Jon Ham MD Entered By: [name] on 09/19/2020 16:26:47 -------------------------------------------------------------------------------- HBO Safety Checklist Details Patient Name: Date of Service: [name], [name] 09/19/2020 8:00 A M Medical Record Number: [id number] Patient Account Number: [id number] Date of Birth/Sex: Treating RN: 1950-10-08 (70 y.o. [name] Primary Care [name]: [name], [place] Other Clinician: Referring [name]: Treating [name]/Extender: [name], [name] Weeks in Treatment: 6 HBO Safety Checklist Items Safety Checklist Consent Form Signed Patient voided / foley secured and emptied this morning jimmy dean egg, cheese When did you last eato sandwich Last dose of injectable or oral agent weekly on Fridays Ostomy pouch emptied and vented if applicable NA All implantable devices assessed, documented and approved Intravenous access site secured and place NA Valuables secured Linens and cotton and cotton/polyester blend (less than 51% polyester) Personal oil-based products / skin lotions / body lotions removed NA Wigs or hairpieces removed NA Smoking or tobacco materials removed NA Books / newspapers / magazines / loose paper removed NA Cologne, aftershave, perfume and deodorant removed Jewelry removed (may wrap wedding band) Make-up removed NA Hair care products removed NA Battery operated devices (external) removed NA Heating patches and chemical warmers removed NA Titanium eyewear removed Nail polish cured greater than 10 hours NA Casting material cured greater than 10 hours NA Hearing aids removed NA Loose dentures or partials removed NA Prosthetics have been removed NA Patient demonstrates  correct use of air break device (if applicable) Patient concerns have been addressed Patient grounding bracelet on and cord attached to chamber Specifics for Inpatients (complete in addition to above) Medication sheet sent with patient Intravenous medications needed or due  during therapy sent with patient Drainage tubes (e.g. nasogastric tube or chest tube secured and vented) Endotracheal or Tracheotomy tube secured Cuff deflated of air and inflated with saline Airway suctioned Electronic Signature(s) Signed: 09/19/2020 6:05:55 PM By: Deon Pilling Entered By: Deon Pilling on 09/19/2020 08:34:46

## 2020-09-19 NOTE — Progress Notes (Signed)
[name], [name] ([id number]) Visit Report for 09/19/2020 SuperBill Details Patient Name: Date of Service: [name], [name] 09/19/2020 Medical Record Number: [id number] Patient Account Number: [id number] Date of Birth/Sex: Treating RN: November 15, 1950 (70 y.o. [name] Primary Care Provider: [name], [place] Other Clinician: Referring Provider: Treating Provider/Extender: [name], [name] Weeks in Treatment: 6 Diagnosis Coding ICD-10 Codes Code Description N30.41 Irradiation cystitis with hematuria Z85.46 Personal history of malignant neoplasm of prostate Facility Procedures CPT4 Code Description Modifier Quantity 35597416 G0277-(Facility Use Only) HBOT full body chamber, 45min , 4 Physician Procedures Quantity CPT4 Code Description Modifier [id number] 68032 - WC PHYS HYPERBARIC OXYGEN THERAPY 1 ICD-10 Diagnosis Description N30.41 Irradiation cystitis with hematuria Electronic Signature(s) Signed: 09/19/2020 4:29:11 PM By: Linton Ham MD Signed: 09/19/2020 6:05:55 PM By: Deon Pilling Entered By: Deon Pilling on 09/19/2020 12:39:23

## 2020-09-20 ENCOUNTER — Encounter (HOSPITAL_BASED_OUTPATIENT_CLINIC_OR_DEPARTMENT_OTHER): Payer: Medicare Other | Admitting: Physician Assistant

## 2020-09-20 DIAGNOSIS — N3041 Irradiation cystitis with hematuria: Secondary | ICD-10-CM | POA: Diagnosis not present

## 2020-09-20 LAB — GLUCOSE, CAPILLARY
Glucose-Capillary: 163 mg/dL — ABNORMAL HIGH (ref 70–99)
Glucose-Capillary: 145 mg/dL — ABNORMAL HIGH (ref 70–99)

## 2020-09-21 ENCOUNTER — Other Ambulatory Visit: Payer: Self-pay

## 2020-09-21 ENCOUNTER — Encounter (HOSPITAL_BASED_OUTPATIENT_CLINIC_OR_DEPARTMENT_OTHER): Payer: Medicare Other | Admitting: Internal Medicine

## 2020-09-21 DIAGNOSIS — N3041 Irradiation cystitis with hematuria: Secondary | ICD-10-CM | POA: Diagnosis not present

## 2020-09-21 LAB — GLUCOSE, CAPILLARY
Glucose-Capillary: 125 mg/dL — ABNORMAL HIGH (ref 70–99)
Glucose-Capillary: 162 mg/dL — ABNORMAL HIGH (ref 70–99)
Glucose-Capillary: 180 mg/dL — ABNORMAL HIGH (ref 70–99)

## 2020-09-21 NOTE — Progress Notes (Signed)
[name] ([id number]) Visit Report for 09/20/2020 HBO Details Patient Name: Date of Service: [name], [name] 09/20/2020 8:00 A M Medical Record Number: [id number] Patient Account Number: [id number] Date of Birth/Sex: Treating RN: [birth year] (70 y.o. [name] Primary Care Carlisha Wisler: [name], [place] TA LIE Other Clinician: Referring Tanmay Halteman: Treating Arinze Rivadeneira/Extender: Doyle Askew, [name] Weeks in Treatment: 6 HBO Treatment Course Details Treatment Course Number: 1 Ordering Tamina Cyphers: Linton Ham T Treatments Ordered: otal 40 HBO Treatment Start Date: 08/14/2020 HBO Indication: Late Effect of Radiation Notes: Radiation Cystitis HBO Treatment Details Treatment Number: 24 Patient Type: Outpatient Chamber Type: Monoplace Chamber Serial #: [id number] Treatment Protocol: 2.5 ATA with 90 minutes oxygen, with two 5 minute air breaks Treatment Details Compression Rate Down: 2.0 psi / minute De-Compression Rate Up: 2.5 psi / minute A breaks and breathing ir Compress Tx Pressure periods Decompress Decompress Begins Reached (leave unused spaces Begins Ends blank) Chamber Pressure (ATA 1 2.5 2.5 2.5 2.5 2.5 - - 2.5 1 ) Clock Time (24 hr) 08:04 08:16 08:46 08:51 09:21 09:26 - - 09:56 10:05 Treatment Length: 121 (minutes) Treatment Segments: 4 Vital Signs Capillary Blood Glucose Reference Range: 80 - 120 mg / dl HBO Diabetic Blood Glucose Intervention Range: <131 mg/dl or >249 mg/dl Time Vitals Blood Respiratory Capillary Blood Glucose Pulse Action Type: Pulse: Temperature: Taken: Pressure: Rate: Glucose (mg/dl): Meter #: Oximetry (%) Taken: Pre 07:45 123/44 69 18 97.8 163 Post 10:07 146/63 66 16 97.4 145 Treatment Response Treatment Toleration: Well Treatment Completion Status: Treatment Completed without Adverse Event Electronic Signature(s) Signed: 09/20/2020 6:55:53 PM By: Worthy Keeler PA-C Signed: 09/21/2020 5:31:56 PM By: Deon Pilling Entered By: Deon Pilling  on 09/20/2020 10:22:39 -------------------------------------------------------------------------------- HBO Safety Checklist Details Patient Name: Date of Service: [name], [name] 09/20/2020 8:00 A M Medical Record Number: [id number] Patient Account Number: [id number] Date of Birth/Sex: Treating RN: Feb 07, 1951 (70 y.o. [name] Primary Care Brae Gartman: [name], [place] Other Clinician: Referring Nancy Manuele: Treating Aanyah Loa/Extender: Doyle Askew, [name] Weeks in Treatment: 6 HBO Safety Checklist Items Safety Checklist Consent Form Signed Patient voided / foley secured and emptied this morning jimmy dean egg, cheese When did you last eato sandwich Last dose of injectable or oral agent weekly on Fridays Ostomy pouch emptied and vented if applicable NA All implantable devices assessed, documented and approved NA Intravenous access site secured and place NA Valuables secured Linens and cotton and cotton/polyester blend (less than 51% polyester) Personal oil-based products / skin lotions / body lotions removed NA Wigs or hairpieces removed NA Smoking or tobacco materials removed NA Books / newspapers / magazines / loose paper removed NA Cologne, aftershave, perfume and deodorant removed Jewelry removed (may wrap wedding band) Make-up removed NA Hair care products removed NA Battery operated devices (external) removed NA Heating patches and chemical warmers removed NA Titanium eyewear removed Nail polish cured greater than 10 hours NA Casting material cured greater than 10 hours NA Hearing aids removed NA Loose dentures or partials removed NA Prosthetics have been removed NA Patient demonstrates correct use of air break device (if applicable) Patient concerns have been addressed Patient grounding bracelet on and cord attached to chamber Specifics for Inpatients (complete in addition to above) Medication sheet sent with patient Intravenous  medications needed or due during therapy sent with patient Drainage tubes (e.g. nasogastric tube or chest tube secured and vented) Endotracheal or Tracheotomy tube secured Cuff deflated of air and inflated with saline Airway suctioned  Electronic Signature(s) Signed: 09/21/2020 5:31:56 PM By: Deon Pilling Entered By: Deon Pilling on 09/20/2020 09:08:35

## 2020-09-21 NOTE — Progress Notes (Signed)
BRINSON, TOZZI ([id number]) Visit Report for 09/20/2020 Arrival Information Details Patient Name: Date of Service: JODI, KAPPES 09/20/2020 8:00 A M Medical Record Number: [id number] Patient Account Number: [id number] Date of Birth/Sex: Treating RN: 1950/11/11 (70 y.o. [name], [name] Primary Care Sonnie Bias: [name], [place] Other Clinician: Referring Demetries Coia: Treating Indie Nickerson/Extender: Doyle Askew, [name] Weeks in Treatment: 6 Visit Information History Since Last Visit Added or deleted any medications: No Patient Arrived: Walker Any new allergies or adverse reactions: No Arrival Time: 07:45 Had a fall or experienced change in No Accompanied By: self activities of daily living that may affect Transfer Assistance: None risk of falls: Patient Identification Verified: Yes Signs or symptoms of abuse/neglect since last visito No Secondary Verification Process Completed: Yes Hospitalized since last visit: No Patient Requires Transmission-Based Precautions: No Implantable device outside of the clinic excluding No Patient Has Alerts: Yes cellular tissue based products placed in the center Patient Alerts: BP Right Arm Only since last visit: Pain Present Now: No Electronic Signature(s) Signed: 09/21/2020 5:31:56 PM By: Deon Pilling Entered By: Deon Pilling on 09/20/2020 09:07:22 -------------------------------------------------------------------------------- Encounter Discharge Information Details Patient Name: Date of Service: [name], [place] 09/20/2020 8:00 A M Medical Record Number: [id number] Patient Account Number: [id number] Date of Birth/Sex: Treating RN: Mar 11, 1951 (70 y.o. [name] Primary Care Veldon Wager: [name], [place] Other Clinician: Referring Curties Conigliaro: Treating Oaklynn Stierwalt/Extender: Doyle Askew, [name] Weeks in Treatment: 6 Encounter Discharge Information Items Discharge Condition: Stable Ambulatory Status: Walker Discharge Destination:  Home Transportation: Private Auto Accompanied By: self Schedule Follow-up Appointment: Yes Clinical Summary of Care: Electronic Signature(s) Signed: 09/21/2020 5:31:56 PM By: Deon Pilling Entered By: Deon Pilling on 09/20/2020 10:22:57 -------------------------------------------------------------------------------- Vitals Details Patient Name: Date of Service: [name], [place] 09/20/2020 8:00 A M Medical Record Number: [id number] Patient Account Number: [id number] Date of Birth/Sex: Treating RN: 1950-12-28 (70 y.o. [name] Primary Care Rafeef Lau: [name], [place] Other Clinician: Referring Kymari Nuon: Treating Fernie Grimm/Extender: Doyle Askew, [name] Weeks in Treatment: 6 Vital Signs Time Taken: 07:45 Temperature (F): 97.8 Height (in): 70 Pulse (bpm): 69 Weight (lbs): 195 Respiratory Rate (breaths/min): 18 Body Mass Index (BMI): 28 Blood Pressure (mmHg): 123/44 Capillary Blood Glucose (mg/dl): 163 Reference Range: 80 - 120 mg / dl Electronic Signature(s) Signed: 09/21/2020 5:31:56 PM By: Deon Pilling Entered By: Deon Pilling on 09/20/2020 09:07:44

## 2020-09-21 NOTE — Progress Notes (Signed)
SAMAJ, WESSELLS ([id number]) Visit Report for 09/20/2020 SuperBill Details Patient Name: Date of Service: UNKNOWN, SCHLEYER 09/20/2020 Medical Record Number: [id number] Patient Account Number: [id number] Date of Birth/Sex: Treating RN: 1950/09/03 (70 y.o. [name] Primary Care Provider: [name], [place] Other Clinician: Referring Provider: Treating Provider/Extender: Doyle Askew, [name] Weeks in Treatment: 6 Diagnosis Coding ICD-10 Codes Code Description N30.41 Irradiation cystitis with hematuria Z85.46 Personal history of malignant neoplasm of prostate Facility Procedures CPT4 Code Description Modifier Quantity [id number] G0277-(Facility Use Only) HBOT full body chamber, 21min , 4 Physician Procedures Quantity CPT4 Code Description Modifier [id number] 65465 - WC PHYS HYPERBARIC OXYGEN THERAPY 1 ICD-10 Diagnosis Description N30.41 Irradiation cystitis with hematuria Electronic Signature(s) Signed: 09/20/2020 6:55:53 PM By: Worthy Keeler PA-C Signed: 09/21/2020 5:31:56 PM By: Deon Pilling Entered By: Deon Pilling on 09/20/2020 10:22:44

## 2020-09-22 ENCOUNTER — Encounter (HOSPITAL_BASED_OUTPATIENT_CLINIC_OR_DEPARTMENT_OTHER): Payer: Medicare Other | Admitting: Internal Medicine

## 2020-09-22 DIAGNOSIS — N3041 Irradiation cystitis with hematuria: Secondary | ICD-10-CM | POA: Diagnosis not present

## 2020-09-22 LAB — GLUCOSE, CAPILLARY
Glucose-Capillary: 155 mg/dL — ABNORMAL HIGH (ref 70–99)
Glucose-Capillary: 151 mg/dL — ABNORMAL HIGH (ref 70–99)

## 2020-09-25 ENCOUNTER — Other Ambulatory Visit: Payer: Self-pay

## 2020-09-25 ENCOUNTER — Encounter (HOSPITAL_BASED_OUTPATIENT_CLINIC_OR_DEPARTMENT_OTHER): Payer: Medicare Other | Admitting: Internal Medicine

## 2020-09-25 DIAGNOSIS — N3041 Irradiation cystitis with hematuria: Secondary | ICD-10-CM | POA: Diagnosis not present

## 2020-09-25 LAB — GLUCOSE, CAPILLARY
Glucose-Capillary: 180 mg/dL — ABNORMAL HIGH (ref 70–99)
Glucose-Capillary: 127 mg/dL — ABNORMAL HIGH (ref 70–99)

## 2020-09-25 NOTE — Progress Notes (Signed)
DEITRICH, STEVE ([id number]) Visit Report for 09/21/2020 SuperBill Details Patient Name: Date of Service: [name], [name] 09/21/2020 Medical Record Number: [id number] Patient Account Number: [id number] Date of Birth/Sex: Treating RN: August 03, 1950 (70 y.o. [name] Primary Care Provider: [name], [place] Other Clinician: Referring Provider: Treating Provider/Extender: [name], [name] Weeks in Treatment: 6 Diagnosis Coding ICD-10 Codes Code Description N30.41 Irradiation cystitis with hematuria Z85.46 Personal history of malignant neoplasm of prostate Facility Procedures CPT4 Code Description Modifier Quantity [id number] G0277-(Facility Use Only) HBOT full body chamber, 41min , 4 Physician Procedures Quantity CPT4 Code Description Modifier 5176160 73710 - WC PHYS HYPERBARIC OXYGEN THERAPY 1 ICD-10 Diagnosis Description N30.41 Irradiation cystitis with hematuria Z85.46 Personal history of malignant neoplasm of prostate Electronic Signature(s) Signed: 09/22/2020 10:33:35 AM By: Linton Ham MD Signed: 09/25/2020 5:37:12 PM By: Levan Hurst RN, BSN Entered By: Levan Hurst on 09/21/2020 10:23:03

## 2020-09-25 NOTE — Progress Notes (Signed)
[name], [name] ([id number]) Visit Report for 09/21/2020 Arrival Information Details Patient Name: Date of Service: [name], [name] 09/21/2020 8:00 A M Medical Record Number: [id number] Patient Account Number: [id number] Date of Birth/Sex: Treating RN: 05-21-1950 (70 y.o. [name] Primary Care [name]: [name], [place] Other Clinician: Referring Hanna Aultman: Treating Jullien Granquist/Extender: [name], [name] Weeks in Treatment: 6 Visit Information History Since Last Visit Added or deleted any medications: No Patient Arrived: [name] Any new allergies or adverse reactions: No Arrival Time: 07:25 Had a fall or experienced change in No Accompanied By: alone activities of daily living that may affect Transfer Assistance: None risk of falls: Patient Identification Verified: Yes Signs or symptoms of abuse/neglect since last visito No Secondary Verification Process Completed: Yes Hospitalized since last visit: No Patient Requires Transmission-Based Precautions: No Implantable device outside of the clinic excluding No Patient Has Alerts: Yes cellular tissue based products placed in the center Patient Alerts: BP Right Arm Only since last visit: Pain Present Now: No Electronic Signature(s) Signed: 09/25/2020 5:37:12 PM By: Jon Hurst RN, BSN Entered By: [name] on 09/21/2020 09:17:30 -------------------------------------------------------------------------------- Encounter Discharge Information Details Patient Name: Date of Service: [name], [name] 09/21/2020 8:00 A M Medical Record Number: [id number] Patient Account Number: [id number] Date of Birth/Sex: Treating RN: 1951/01/23 (70 y.o. [name] Primary Care Dream Harman: [name], [place] Other Clinician: Referring Francisca Langenderfer: Treating Lucrezia Dehne/Extender: [name], [name] Weeks in Treatment: 6 Encounter Discharge Information Items Discharge Condition: Stable Ambulatory Status: Walker Discharge  Destination: Home Transportation: Private Auto Accompanied By: alone Schedule Follow-up Appointment: Yes Clinical Summary of Care: Patient Declined Electronic Signature(s) Signed: 09/25/2020 5:37:12 PM By: Jon Hurst RN, BSN Entered By: [name] on 09/21/2020 10:23:21 -------------------------------------------------------------------------------- Vitals Details Patient Name: Date of Service: [name], [place] 09/21/2020 8:00 A M Medical Record Number: [id number] Patient Account Number: [id number] Date of Birth/Sex: Treating RN: 12-09-1950 (70 y.o. [name] Primary Care [name]: [name], [place] Other Clinician: Referring Catalena Stanhope: Treating Nolia Tschantz/Extender: [name], [name] Weeks in Treatment: 6 Vital Signs Time Taken: 07:25 Temperature (F): 98.7 Height (in): 70 Pulse (bpm): 72 Weight (lbs): 195 Respiratory Rate (breaths/min): 16 Body Mass Index (BMI): 28 Blood Pressure (mmHg): 129/46 Capillary Blood Glucose (mg/dl): 125 Reference Range: 80 - 120 mg / dl Electronic Signature(s) Signed: 09/25/2020 5:37:12 PM By: Jon Hurst RN, BSN Entered By: [name] on 09/21/2020 09:18:02

## 2020-09-25 NOTE — Progress Notes (Signed)
[name] ([id number]) Visit Report for 09/21/2020 HBO Details Patient Name: Date of Service: [name], [name] 09/21/2020 8:00 A M Medical Record Number: [id number] Patient Account Number: [id number] Date of Birth/Sex: Treating RN: 1951/03/19 (70 y.o. 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 HBO Treatment Details Treatment Number: 25 Patient Type: Outpatient Chamber Type: Monoplace Chamber Serial #: [id number] Treatment Protocol: 2.5 ATA with 90 minutes oxygen, with two 5 minute air breaks Treatment Details Compression Rate Down: 2.0 psi / minute De-Compression Rate Up: 2.5 psi / minute A breaks and breathing ir Compress Tx Pressure periods Decompress Decompress Begins Reached (leave unused spaces Begins Ends blank) Chamber Pressure (ATA 1 2.5 2.5 2.5 2.5 2.5 - - 2.5 1 ) Clock Time (24 hr) 07:57 08:09 08:39 08:44 09:14 09:19 - - 09:49 09:59 Treatment Length: 122 (minutes) Treatment Segments: 4 Vital Signs Capillary Blood Glucose Reference Range: 80 - 120 mg / dl HBO Diabetic Blood Glucose Intervention Range: <131 mg/dl or >249 mg/dl Type: Time Vitals Blood Pulse: Respiratory Temperature: Capillary Blood Glucose Pulse Action Taken: Pressure: Rate: Glucose (mg/dl): Meter #: Oximetry (%) Taken: Pre 07:25 129/46 72 16 98.7 125 8 oz glucerna given - recheck 162 after 30 min Post 09:59 145/53 68 16 97.8 180 Treatment Response Treatment Completion Status: Treatment Completed without Adverse Event [name] Notes No concerns with treatment given Physician HBO Attestation: I certify that I supervised this HBO treatment in accordance  with Medicare guidelines. A trained emergency response team is readily available per Yes hospital policies and procedures. Continue HBOT as ordered. Yes Electronic Signature(s) Signed: 09/22/2020 10:33:35 AM By: Linton Ham MD Entered By: Linton Ham on 09/21/2020 16:48:13 -------------------------------------------------------------------------------- HBO Safety Checklist Details Patient Name: Date of Service: [name], [name] 09/21/2020 8:00 A M Medical Record Number: [id number] Patient Account Number: [id number] Date of Birth/Sex: Treating RN: 10/22/1950 (70 y.o. [name] Primary Care Kishaun Erekson: [name], [place] Other Clinician: Referring [name]: Treating [name]/Extender: [name], [name] Weeks in Treatment: 6 HBO Safety Checklist Items Safety Checklist Consent Form Signed Patient voided / foley secured and emptied When did you last eato sausage biscuit this am Last dose of injectable or oral agent na Ostomy pouch emptied and vented if applicable NA All implantable devices assessed, documented and approved NA Intravenous access site secured and place NA Valuables secured Linens and cotton and cotton/polyester blend (less than 51% polyester) Personal oil-based products / skin lotions / body lotions removed Wigs or hairpieces removed Smoking or tobacco materials removed Books / newspapers / magazines / loose paper removed Cologne, aftershave, perfume and deodorant removed Jewelry removed (may wrap wedding band) Make-up removed Hair care products removed Battery operated devices (external) removed Heating patches and chemical warmers removed Titanium eyewear removed Nail polish cured greater than 10 hours NA Casting material cured greater than 10 hours NA Hearing aids removed NA Loose dentures or partials removed NA Prosthetics have been removed Patient demonstrates correct use of air break device (if applicable) Patient concerns  have been addressed Patient grounding bracelet on and cord attached to chamber Specifics for Inpatients (complete in addition to above) Medication sheet sent with patient NA Intravenous medications needed or due during therapy sent with patient  NA Drainage tubes (e.g. nasogastric tube or chest tube secured and vented) NA Endotracheal or Tracheotomy tube secured NA Cuff deflated of air and inflated with saline NA Airway suctioned NA Electronic Signature(s) Signed: 09/25/2020 5:37:12 PM By: Levan Hurst RN, BSN Entered By: Levan Hurst on 09/21/2020 [place]

## 2020-09-25 NOTE — Progress Notes (Signed)
[name] ([id number]) Visit Report for 09/22/2020 Arrival Information Details Patient Name: Date of Service: [name], [name] 09/22/2020 8:00 A M Medical Record Number: [id number] Patient Account Number: [id number] Date of Birth/Sex: Treating RN: 10-27-1950 (70 y.o. [name] Primary Care Future Yeldell: [name], [place] Other Clinician: Referring Vianny Schraeder: Treating Lillah Standre/Extender: [name], [name] Weeks in Treatment: 6 Visit Information History Since Last Visit Added or deleted any medications: No Patient Arrived: [name] Any new allergies or adverse reactions: No Arrival Time: 07:35 Had a fall or experienced change in No Accompanied By: alone activities of daily living that may affect Transfer Assistance: None risk of falls: Patient Identification Verified: Yes Signs or symptoms of abuse/neglect since last visito No Secondary Verification Process Completed: Yes Hospitalized since last visit: No Patient Requires Transmission-Based Precautions: No Implantable device outside of the clinic excluding No Patient Has Alerts: Yes cellular tissue based products placed in the center Patient Alerts: BP Right Arm Only since last visit: Pain Present Now: No Electronic Signature(s) Signed: 09/25/2020 5:37:12 PM By: Levan Hurst RN, BSN Entered By: Levan Hurst on 09/22/2020 08:28:25 -------------------------------------------------------------------------------- [place] Details Patient Name: Date of Service: [name], [name] 09/22/2020 8:00 A M Medical Record Number: [id number] Patient Account Number: [id number] Date of Birth/Sex: Treating RN: [birth year] (70 y.o. [name] Primary Care Kerie Badger: [name], [place] Other Clinician: Referring Ferron Ishmael: Treating Lalla Laham/Extender: [name], [name] Weeks in Treatment: 6 Vital Signs Time Taken: 07:35 Temperature (F): 98.2 Height (in): 70 Pulse (bpm): 70 Weight (lbs): 195 Respiratory Rate  (breaths/min): 18 Body Mass Index (BMI): 28 Blood Pressure (mmHg): 106/43 Capillary Blood Glucose (mg/dl): 155 Reference Range: 80 - 120 mg / dl Electronic Signature(s) Signed: 09/25/2020 5:37:12 PM By: Levan Hurst RN, BSN Entered By: Levan Hurst on 09/22/2020 08:28:46

## 2020-09-25 NOTE — Progress Notes (Signed)
[name] ([id number]) Visit Report for 09/22/2020 HBO Details Patient Name: Date of Service: [name], [name] 09/22/2020 8:00 A M Medical Record Number: [id number] Patient Account Number: [id number] Date of Birth/Sex: Treating RN: Aug 18, 1950 (70 y.o. Marcheta Grammes Primary Care Damari Suastegui: [name], [place] TA LIE Other Clinician: Referring Tiffinie Caillier: Treating Annlee Glandon/Extender: [name], [name] Weeks in Treatment: 6 HBO Treatment Course Details Treatment Course Number: 1 Ordering Hunt Zajicek: Linton Ham T Treatments Ordered: otal 40 HBO Treatment Start Date: 08/14/2020 HBO Indication: Late Effect of Radiation Notes: Radiation Cystitis HBO Treatment Details Treatment Number: 26 Patient Type: Outpatient Chamber Type: Monoplace Chamber Serial #: [id number] Treatment Protocol: 2.5 ATA with 90 minutes oxygen, with two 5 minute air breaks Treatment Details Compression Rate Down: 2.0 psi / minute De-Compression Rate Up: 2.5 psi / minute A breaks and breathing ir Compress Tx Pressure periods Decompress Decompress Begins Reached (leave unused spaces Begins Ends blank) Chamber Pressure (ATA 1 2.5 2.5 2.5 2.5 2.5 - - 2.5 1 ) Clock Time (24 hr) 07:53 08:05 08:35 08:40 09:10 09:15 - - 09:45 09:55 Treatment Length: 122 (minutes) Treatment Segments: 4 Vital Signs Capillary Blood Glucose Reference Range: 80 - 120 mg / dl HBO Diabetic Blood Glucose Intervention Range: <131 mg/dl or >249 mg/dl Time Vitals Blood Respiratory Capillary Blood Glucose Pulse Action Type: Pulse: Temperature: Taken: Pressure: Rate: Glucose (mg/dl): Meter #: Oximetry (%) Taken: Pre 07:35 106/43 70 18 98.2 155 Post 09:55 130/77 68 18 97.5 151 Treatment Response Treatment Completion Status: Treatment Completed without Adverse Event Kaidence Sant Notes No concerns with treatment given Physician HBO Attestation: I certify that I supervised this HBO treatment in accordance with Medicare guidelines. A trained  emergency response team is readily available per Yes hospital policies and procedures. Continue HBOT as ordered. Yes Electronic Signature(s) Signed: 09/22/2020 5:27:23 PM By: Linton Ham MD Entered By: Linton Ham on 09/22/2020 17:20:18 -------------------------------------------------------------------------------- HBO Safety Checklist Details Patient Name: Date of Service: [name], [place] 09/22/2020 8:00 A M Medical Record Number: [id number] Patient Account Number: [id number] Date of Birth/Sex: Treating RN: [birth year] (70 y.o. [name] Primary Care Carman Auxier: [name], [place] Other Clinician: Referring Janari Gagner: Treating Zorian Gunderman/Extender: [name], [name] Weeks in Treatment: 6 HBO Safety Checklist Items Safety Checklist Consent Form Signed Patient voided / foley secured and emptied When did you last eato 0630- sausage biscuit Last dose of injectable or oral agent na Ostomy pouch emptied and vented if applicable NA All implantable devices assessed, documented and approved NA Intravenous access site secured and place NA Valuables secured Linens and cotton and cotton/polyester blend (less than 51% polyester) Personal oil-based products / skin lotions / body lotions removed Wigs or hairpieces removed Smoking or tobacco materials removed Books / newspapers / magazines / loose paper removed Cologne, aftershave, perfume and deodorant removed Jewelry removed (may wrap wedding band) Make-up removed Hair care products removed Battery operated devices (external) removed Heating patches and chemical warmers removed Titanium eyewear removed Nail polish cured greater than 10 hours NA Casting material cured greater than 10 hours NA Hearing aids removed NA Loose dentures or partials removed NA Prosthetics have been removed Patient demonstrates correct use of air break device (if applicable) Patient concerns have been addressed Patient grounding  bracelet on and cord attached to chamber Specifics for Inpatients (complete in addition to above) Medication sheet sent with patient NA Intravenous medications needed or due during therapy sent with patient NA Drainage tubes (e.g. nasogastric tube or chest tube secured and  vented) NA Endotracheal or Tracheotomy tube secured NA Cuff deflated of air and inflated with saline NA Airway suctioned NA Electronic Signature(s) Signed: 09/25/2020 5:37:12 PM By: Levan Hurst RN, BSN Entered By: Levan Hurst on 09/22/2020 08:29:36

## 2020-09-25 NOTE — Progress Notes (Signed)
[name], [name] ([id number]) Visit Report for 09/25/2020 [place] Details Patient Name: Date of Service: [name], [name] 09/25/2020 Medical Record Number: [id number] Patient Account Number: [id number] Date of Birth/Sex: Treating RN: [birth year] (70 y.o. [name] Primary Care Provider: [name], [place] Other Clinician: Referring Provider: Treating Provider/Extender: [name], [name] Weeks in Treatment: 7 Diagnosis Coding ICD-10 Codes Code Description N30.41 Irradiation cystitis with hematuria Z85.46 Personal history of malignant neoplasm of prostate Facility Procedures CPT4 Code Description Modifier Quantity 12458099 G0277-(Facility Use Only) HBOT full body chamber, 34min , 4 Physician Procedures Quantity CPT4 Code Description Modifier [id number] [id number] - WC PHYS HYPERBARIC OXYGEN THERAPY 1 ICD-10 Diagnosis Description N30.41 Irradiation cystitis with hematuria Electronic Signature(s) Signed: 09/25/2020 5:10:27 PM By: Linton Ham MD Signed: 09/25/2020 6:33:53 PM By: Deon Pilling Entered By: Deon Pilling on 09/25/2020 10:18:18

## 2020-09-25 NOTE — Progress Notes (Signed)
KAYLUM, SHRUM ([id number]) Visit Report for 09/25/2020 Arrival Information Details Patient Name: Date of Service: BLAYDEN, CONWE[place] 09/25/2020 8:00 A M Medical Record Number: [id number] Patient Account Number: [id number] Date of Birth/Sex: Treating RN: [birth year] (70 y.o. [name] Primary Care Lorain Fettes: [name], [place] Other Clinician: Referring Alfie Alderfer: Treating Kuba Shepherd/Extender: [name], [name] Weeks in Treatment: 7 Visit Information History Since Last Visit Added or deleted any medications: No Patient Arrived: [name] Any new allergies or adverse reactions: No Arrival Time: 07:48 Had a fall or experienced change in No Accompanied By: self activities of daily living that may affect Transfer Assistance: None risk of falls: Patient Identification Verified: Yes Signs or symptoms of abuse/neglect since last visito No Secondary Verification Process Completed: Yes Hospitalized since last visit: No Patient Requires Transmission-Based Precautions: No Implantable device outside of the clinic excluding No Patient Has Alerts: Yes cellular tissue based products placed in the center Patient Alerts: BP Right Arm Only since last visit: Pain Present Now: No Notes Per patient seen urology on Friday and foley catheter has been removed. Patient reports no blood in urine when voiding. Electronic Signature(s) Signed: 09/25/2020 6:33:53 PM By: Deon Pilling Entered By: Deon Pilling on 09/25/2020 08:55:19 -------------------------------------------------------------------------------- Encounter Discharge Information Details Patient Name: Date of Service: [name], [place] 09/25/2020 8:00 A M Medical Record Number: [id number] Patient Account Number: [id number] Date of Birth/Sex: Treating RN: 1950/05/21 (70 y.o. [name] Primary Care Vernice Mannina: [name], [place] Other Clinician: Referring Shundra Wirsing: Treating Wilda Wetherell/Extender: [name], [name] Weeks in Treatment:  7 Encounter Discharge Information Items Discharge Condition: Stable Ambulatory Status: Walker Discharge Destination: Home Transportation: Private Auto Accompanied By: self Schedule Follow-up Appointment: Yes Clinical Summary of Care: Electronic Signature(s) Signed: 09/25/2020 6:33:53 PM By: Deon Pilling Entered By: Deon Pilling on 09/25/2020 10:18:32 -------------------------------------------------------------------------------- Vitals Details Patient Name: Date of Service: [name], [place] 09/25/2020 8:00 A M Medical Record Number: [id number] Patient Account Number: [id number] Date of Birth/Sex: Treating RN: [birth year] (70 y.o. [name] Primary Care Shawnika Pepin: [name], [place] Other Clinician: Referring Latissa Frick: Treating Lilac Hoff/Extender: [name], [name] Weeks in Treatment: 7 Vital Signs Time Taken: 07:48 Temperature (F): 98.4 Height (in): 70 Pulse (bpm): 73 Weight (lbs): 195 Respiratory Rate (breaths/min): 18 Body Mass Index (BMI): 28 Blood Pressure (mmHg): 137/52 Capillary Blood Glucose (mg/dl): 180 Reference Range: 80 - 120 mg / dl Electronic Signature(s) Signed: 09/25/2020 6:33:53 PM By: Deon Pilling Entered By: Deon Pilling on 09/25/2020 08:55:41

## 2020-09-25 NOTE — Progress Notes (Signed)
[name] ([id number]) Visit Report for 09/25/2020 HBO Details Patient Name: Date of Service: [name], [name] 09/25/2020 8:00 A M Medical Record Number: [id number] Patient Account Number: [id number] Date of Birth/Sex: Treating RN: [birth year] (70 y.o. 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 HBO Treatment Details Treatment Number: 27 Patient Type: Outpatient Chamber Type: Monoplace Chamber Serial #: [id number] Treatment Protocol: 2.5 ATA with 90 minutes oxygen, with two 5 minute air breaks Treatment Details Compression Rate Down: 2.0 psi / minute De-Compression Rate Up: 2.5 psi / minute A breaks and breathing ir Compress Tx Pressure periods Decompress Decompress Begins Reached (leave unused spaces Begins Ends blank) Chamber Pressure (ATA 1 2.5 2.5 2.5 2.5 2.5 - - 2.5 1 ) Clock Time (24 hr) 08:00 08:11 08:41 08:46 09:16 09:21 - - 09:51 10:00 Treatment Length: 120 (minutes) Treatment Segments: 4 Vital Signs Capillary Blood Glucose Reference Range: 80 - 120 mg / dl HBO Diabetic Blood Glucose Intervention Range: <131 mg/dl or >249 mg/dl Time Vitals Blood Respiratory Capillary Blood Glucose Pulse Action Type: Pulse: Temperature: Taken: Pressure: Rate: Glucose (mg/dl): Meter #: Oximetry (%) Taken: Pre 07:48 137/52 73 18 98.4 180 Post 10:01 142/72 67 16 97.6 127 Treatment Response Treatment Toleration: Well Treatment Completion Status: Treatment Completed without Adverse Event [name] Notes No concerns with treatment given Physician HBO Attestation: I certify that I supervised this HBO treatment in accordance with  Medicare guidelines. A trained emergency response team is readily available per Yes hospital policies and procedures. Continue HBOT as ordered. Yes Electronic Signature(s) Signed: 09/25/2020 5:10:27 PM By: Jon Ham MD Entered By: [name] on 09/25/2020 15:27:23 -------------------------------------------------------------------------------- HBO Safety Checklist Details Patient Name: Date of Service: [name], [name] 09/25/2020 8:00 A M Medical Record Number: [id number] Patient Account Number: [id number] Date of Birth/Sex: Treating RN: [birth year] (70 y.o. [name] Primary Care Cameka Rae: [name], [place] Other Clinician: Referring [name]: Treating [name]/Extender: [name], [name] Weeks in Treatment: 7 HBO Safety Checklist Items Safety Checklist Consent Form Signed Patient voided / foley secured and emptied this morning [name] and cheese sandwich. When did you last eato coffee Last dose of injectable or oral agent weekly on Friday Ostomy pouch emptied and vented if applicable NA All implantable devices assessed, documented and approved NA Intravenous access site secured and place NA Valuables secured Linens and cotton and cotton/polyester blend (less than 51% polyester) Personal oil-based products / skin lotions / body lotions removed Wigs or hairpieces removed NA Smoking or tobacco materials removed NA Books / newspapers / magazines / loose paper removed NA Cologne, aftershave, perfume and deodorant removed Jewelry removed (may wrap wedding band) Make-up removed NA Hair care products removed NA Battery operated devices (external) removed NA Heating patches and chemical warmers removed NA Titanium eyewear removed Nail polish cured greater than 10 hours NA Casting material cured greater than 10 hours NA Hearing aids removed NA Loose dentures or partials removed NA Prosthetics have been removed NA Patient demonstrates correct  use of air break device (if applicable) Patient concerns have been addressed Patient grounding bracelet on and cord attached to chamber Specifics for Inpatients (complete in addition to above) Medication sheet sent with patient Intravenous medications needed or due  during therapy sent with patient Drainage tubes (e.g. nasogastric tube or chest tube secured and vented) Endotracheal or Tracheotomy tube secured Cuff deflated of air and inflated with saline Airway suctioned Electronic Signature(s) Signed: 09/25/2020 6:33:53 PM By: [name] Entered By: [name] on 09/25/2020 08:56:26

## 2020-09-26 ENCOUNTER — Encounter (HOSPITAL_BASED_OUTPATIENT_CLINIC_OR_DEPARTMENT_OTHER): Payer: Medicare Other | Admitting: Internal Medicine

## 2020-09-26 DIAGNOSIS — N3041 Irradiation cystitis with hematuria: Secondary | ICD-10-CM | POA: Diagnosis not present

## 2020-09-26 LAB — GLUCOSE, CAPILLARY
Glucose-Capillary: 162 mg/dL — ABNORMAL HIGH (ref 70–99)
Glucose-Capillary: 143 mg/dL — ABNORMAL HIGH (ref 70–99)

## 2020-09-27 ENCOUNTER — Encounter (HOSPITAL_BASED_OUTPATIENT_CLINIC_OR_DEPARTMENT_OTHER): Payer: Medicare Other | Admitting: Physician Assistant

## 2020-09-27 ENCOUNTER — Other Ambulatory Visit: Payer: Self-pay

## 2020-09-27 ENCOUNTER — Emergency Department (HOSPITAL_COMMUNITY)
Admission: EM | Admit: 2020-09-27 | Discharge: 2020-09-28 | Disposition: A | Discharge status: Home / Self Care | Payer: Medicare Other | Attending: Emergency Medicine | Admitting: Emergency Medicine

## 2020-09-27 DIAGNOSIS — R338 Other retention of urine: Secondary | ICD-10-CM | POA: Insufficient documentation

## 2020-09-27 DIAGNOSIS — Z8546 Personal history of malignant neoplasm of prostate: Secondary | ICD-10-CM | POA: Insufficient documentation

## 2020-09-27 DIAGNOSIS — E1140 Type 2 diabetes mellitus with diabetic neuropathy, unspecified: Secondary | ICD-10-CM | POA: Insufficient documentation

## 2020-09-27 DIAGNOSIS — Z955 Presence of coronary angioplasty implant and graft: Secondary | ICD-10-CM | POA: Diagnosis not present

## 2020-09-27 DIAGNOSIS — N189 Chronic kidney disease, unspecified: Secondary | ICD-10-CM | POA: Insufficient documentation

## 2020-09-27 DIAGNOSIS — I4891 Unspecified atrial fibrillation: Secondary | ICD-10-CM

## 2020-09-27 DIAGNOSIS — R310 Gross hematuria: Secondary | ICD-10-CM | POA: Diagnosis not present

## 2020-09-27 DIAGNOSIS — Z87891 Personal history of nicotine dependence: Secondary | ICD-10-CM | POA: Insufficient documentation

## 2020-09-27 DIAGNOSIS — N3041 Irradiation cystitis with hematuria: Secondary | ICD-10-CM | POA: Insufficient documentation

## 2020-09-27 DIAGNOSIS — I129 Hypertensive chronic kidney disease with stage 1 through stage 4 chronic kidney disease, or unspecified chronic kidney disease: Secondary | ICD-10-CM | POA: Diagnosis not present

## 2020-09-27 DIAGNOSIS — E039 Hypothyroidism, unspecified: Secondary | ICD-10-CM | POA: Diagnosis not present

## 2020-09-27 DIAGNOSIS — I2510 Atherosclerotic heart disease of native coronary artery without angina pectoris: Secondary | ICD-10-CM | POA: Diagnosis not present

## 2020-09-27 DIAGNOSIS — Z794 Long term (current) use of insulin: Secondary | ICD-10-CM | POA: Insufficient documentation

## 2020-09-27 DIAGNOSIS — Z79899 Other long term (current) drug therapy: Secondary | ICD-10-CM | POA: Diagnosis not present

## 2020-09-27 LAB — GLUCOSE, CAPILLARY
Glucose-Capillary: 149 mg/dL — ABNORMAL HIGH (ref 70–99)
Glucose-Capillary: 128 mg/dL — ABNORMAL HIGH (ref 70–99)

## 2020-09-27 NOTE — Progress Notes (Signed)
[name], [name] ([id number]) Visit Report for 09/26/2020 HBO Details Patient Name: Date of Service: [name], [name] 09/26/2020 8:00 A M Medical Record Number: [id number] Patient Account Number: [id number] Date of Birth/Sex: Treating RN: 1951-01-31 (70 y.o. 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 HBO Treatment Details Treatment Number: 28 Patient Type: Outpatient Chamber Type: Monoplace Chamber Serial #: [id number] Treatment Protocol: 2.5 ATA with 90 minutes oxygen, with two 5 minute air breaks Treatment Details Compression Rate Down: 2.0 psi / minute De-Compression Rate Up: 2.5 psi / minute A breaks and breathing ir Compress Tx Pressure periods Decompress Decompress Begins Reached (leave unused spaces Begins Ends blank) Chamber Pressure (ATA 1 2.5 2.5 2.5 2.5 2.5 - - 2.5 1 ) Clock Time (24 hr) 07:58 08:10 08:40 08:45 09:15 09:20 - - 09:50 10:00 Treatment Length: 122 (minutes) Treatment Segments: 4 Vital Signs Capillary Blood Glucose Reference Range: 80 - 120 mg / dl HBO Diabetic Blood Glucose Intervention Range: <131 mg/dl or >249 mg/dl Time Vitals Blood Respiratory Capillary Blood Glucose Pulse Action Type: Pulse: Temperature: Taken: Pressure: Rate: Glucose (mg/dl): Meter #: Oximetry (%) Taken: Pre 07:45 129/47 71 16 98.2 162 Post 10:00 150/97 67 16 97.5 143 Treatment Response Treatment Completion Status: Treatment Completed without Adverse Event [name] Notes No concerns with treatment given although apparently he had some bleeding again after he left the chambers today he has an appointment with urology Physician HBO  Attestation: I certify that I supervised this HBO treatment in accordance with Medicare guidelines. A trained emergency response team is readily available per Yes hospital policies and procedures. Continue HBOT as ordered. Yes Electronic Signature(s) Signed: 09/26/2020 4:29:36 PM By: Jon Ham MD Entered By: [name] on 09/26/2020 16:28:21 -------------------------------------------------------------------------------- HBO Safety Checklist Details Patient Name: Date of Service: [name], [name] 09/26/2020 8:00 A M Medical Record Number: [id number] Patient Account Number: [id number] Date of Birth/Sex: Treating RN: 1950-04-13 (70 y.o. [name] Primary Care [name]: [name], [place] Other Clinician: Referring [name]: Treating [name]/Extender: [name], [name] Weeks in Treatment: 7 HBO Safety Checklist Items Safety Checklist Consent Form Signed Patient voided / foley secured and emptied When did you last eato 0630 - sausage biscuit Last dose of injectable or oral agent na Ostomy pouch emptied and vented if applicable NA All implantable devices assessed, documented and approved NA Intravenous access site secured and place NA Valuables secured Linens and cotton and cotton/polyester blend (less than 51% polyester) Personal oil-based products / skin lotions / body lotions removed Wigs or hairpieces removed Smoking or tobacco materials removed Books / newspapers / magazines / loose paper removed Cologne, aftershave, perfume and deodorant removed Jewelry removed (may wrap wedding band) Make-up removed Hair care products removed Battery operated devices (external) removed Heating patches and chemical warmers removed Titanium eyewear removed Nail polish cured greater than 10 hours NA Casting material cured greater than 10 hours NA Hearing aids removed NA Loose dentures or partials removed NA Prosthetics have been removed Patient  demonstrates correct use of air break device (if applicable) Patient concerns have been addressed Patient grounding bracelet on and cord attached to chamber Specifics for Inpatients (complete in addition to above) Medication sheet sent with patient NA Intravenous  medications needed or due during therapy sent with patient NA Drainage tubes (e.g. nasogastric tube or chest tube secured and vented) NA Endotracheal or Tracheotomy tube secured NA Cuff deflated of air and inflated with saline NA Airway suctioned NA Electronic Signature(s) Signed: 09/27/2020 5:46:25 PM By: Jon Hurst RN, BSN Entered By: [name] on 09/26/2020 10:15:32

## 2020-09-27 NOTE — Progress Notes (Signed)
NASH, BOLLS ([id number]) Visit Report for 09/27/2020 SuperBill Details Patient Name: Date of Service: [name], [name] 09/27/2020 Medical Record Number: [id number] Patient Account Number: [id number] Date of Birth/Sex: Treating RN: [birth year] (70 y.o. [name] Primary Care Provider: [name], [place] Other Clinician: Referring Provider: Treating Provider/Extender: Doyle Askew, [name] Weeks in Treatment: 7 Diagnosis Coding ICD-10 Codes Code Description N30.41 Irradiation cystitis with hematuria Z85.46 Personal history of malignant neoplasm of prostate Facility Procedures CPT4 Code Description Modifier Quantity 79024097 G0277-(Facility Use Only) HBOT full body chamber, 72min , 4 Physician Procedures Quantity CPT4 Code Description Modifier [id number] 42683 - WC PHYS HYPERBARIC OXYGEN THERAPY 1 ICD-10 Diagnosis Description N30.41 Irradiation cystitis with hematuria Electronic Signature(s) Signed: 09/27/2020 4:01:47 PM By: Worthy Keeler PA-C Signed: 09/27/2020 5:52:09 PM By: Deon Pilling Entered By: Deon Pilling on 09/27/2020 10:31:32

## 2020-09-27 NOTE — Progress Notes (Signed)
DORR, PERROT ([id number]) Visit Report for 09/26/2020 SuperBill Details Patient Name: Date of Service: [name], [name] 09/26/2020 Medical Record Number: [id number] Patient Account Number: [id number] Date of Birth/Sex: Treating RN: [birth year] (70 y.o. [name] Primary Care Provider: [name], [place] Other Clinician: Referring Provider: Treating Provider/Extender: [name], [name] Weeks in Treatment: 7 Diagnosis Coding ICD-10 Codes Code Description N30.41 Irradiation cystitis with hematuria Z85.46 Personal history of malignant neoplasm of prostate Facility Procedures CPT4 Code Description Modifier Quantity 72620355 G0277-(Facility Use Only) HBOT full body chamber, 71min , 4 Physician Procedures Quantity CPT4 Code Description Modifier [id number] 45364 - WC PHYS HYPERBARIC OXYGEN THERAPY 1 ICD-10 Diagnosis Description N30.41 Irradiation cystitis with hematuria Z85.46 Personal history of malignant neoplasm of prostate Electronic Signature(s) Signed: 09/26/2020 4:29:36 PM By: Linton Ham MD Signed: 09/27/2020 5:46:25 PM By: Levan Hurst RN, BSN Entered By: Levan Hurst on 09/26/2020 10:16:28

## 2020-09-27 NOTE — Progress Notes (Signed)
[name], [name] ([id number]) Visit Report for 09/26/2020 Arrival Information Details Patient Name: Date of Service: [name], [name] 09/26/2020 8:00 A M Medical Record Number: [id number] Patient Account Number: [id number] Date of Birth/Sex: Treating RN: 26-Feb-1951 (70 y.o. [name] Primary Care [name]: [name], [place] Other Clinician: Referring [name]: Treating [name]/Extender: [name], NA [name] in Treatment: 7 Visit Information History Since Last Visit Added or deleted any medications: No Patient Arrived: [name] Any new allergies or adverse reactions: No Arrival Time: 07:45 Had a fall or experienced change in No Accompanied By: alone activities of daily living that may affect Transfer Assistance: None risk of falls: Patient Identification Verified: Yes Signs or symptoms of abuse/neglect since last visito No Secondary Verification Process Completed: Yes Hospitalized since last visit: No Patient Requires Transmission-Based Precautions: No Implantable device outside of the clinic excluding No Patient Has Alerts: Yes cellular tissue based products placed in the center Patient Alerts: BP Right Arm Only since last visit: Pain Present Now: No Electronic Signature(s) Signed: 09/27/2020 5:46:25 PM By: Jon Hurst RN, BSN Entered By: [name] on 09/26/2020 10:14:32 -------------------------------------------------------------------------------- Encounter Discharge Information Details Patient Name: Date of Service: [name], [name] 09/26/2020 8:00 A M Medical Record Number: [id number] Patient Account Number: [id number] Date of Birth/Sex: Treating RN: 09-Jul-1950 (70 y.o. [name] Primary Care [name]: [name], [place] Other Clinician: Referring [name]: Treating [name]/Extender: [name], NA [name] in Treatment: 7 Encounter Discharge Information Items Discharge Condition: Stable Ambulatory Status: Walker Discharge  Destination: Home Transportation: Private Auto Accompanied By: alone Schedule Follow-up Appointment: Yes Clinical Summary of Care: Patient Declined Electronic Signature(s) Signed: 09/27/2020 5:46:25 PM By: Jon Hurst RN, BSN Entered By: [name] on 09/26/2020 10:16:54 -------------------------------------------------------------------------------- Vitals Details Patient Name: Date of Service: [name], [name] 09/26/2020 8:00 A M Medical Record Number: [id number] Patient Account Number: [id number] Date of Birth/Sex: Treating RN: [birth year] (70 y.o. [name] Primary Care [name]: [name], [place] Other Clinician: Referring [name]: Treating [name]/Extender: [name], NA [name] in Treatment: 7 Vital Signs Time Taken: 07:45 Temperature (F): 98.2 Height (in): 70 Pulse (bpm): 71 Weight (lbs): 195 Respiratory Rate (breaths/min): 16 Body Mass Index (BMI): 28 Blood Pressure (mmHg): 129/47 Capillary Blood Glucose (mg/dl): 162 Reference Range: 80 - 120 mg / dl Electronic Signature(s) Signed: 09/27/2020 5:46:25 PM By: Jon Hurst RN, BSN Entered By: [name] on 09/26/2020 10:14:48

## 2020-09-27 NOTE — Progress Notes (Signed)
CLYDE, ZARRELLA ([id number]) Visit Report for 09/27/2020 Arrival Information Details Patient Name: Date of Service: BRONC, BROSSEAU 09/27/2020 8:00 A M Medical Record Number: [id number] Patient Account Number: [id number] Date of Birth/Sex: Treating RN: 06/28/1950 (70 y.o. [name] Primary Care Billye Nydam: [name], [place] Other Clinician: Referring Blanca Thornton: Treating Jelani Vreeland/Extender: Doyle Askew, [name] Weeks in Treatment: 7 Visit Information History Since Last Visit Added or deleted any medications: No Patient Arrived: [name] Any new allergies or adverse reactions: No Arrival Time: 07:45 Had a fall or experienced change in No Accompanied By: self activities of daily living that may affect Transfer Assistance: None risk of falls: Patient Identification Verified: Yes Signs or symptoms of abuse/neglect since last visito No Secondary Verification Process Completed: Yes Hospitalized since last visit: No Patient Requires Transmission-Based Precautions: No Implantable device outside of the clinic excluding No Patient Has Alerts: Yes cellular tissue based products placed in the center Patient Alerts: BP Right Arm Only since last visit: Pain Present Now: No Notes Patient reports hematuria. Per patient weekly hemoglobin checks at dialysis and follows up with urology on Friday. MD made aware. Electronic Signature(s) Signed: 09/27/2020 5:52:09 PM By: Deon Pilling Entered By: Deon Pilling on 09/27/2020 09:43:49 -------------------------------------------------------------------------------- Encounter Discharge Information Details Patient Name: Date of Service: [name], [place] 09/27/2020 8:00 A M Medical Record Number: [id number] Patient Account Number: [id number] Date of Birth/Sex: Treating RN: Mar 17, 1951 (70 y.o. [name] Primary Care Ellawyn Wogan: [name], [place] Other Clinician: Referring Riann Oman: Treating Donyae Kilner/Extender: Doyle Askew, [name] Weeks in  Treatment: 7 Encounter Discharge Information Items Discharge Condition: Stable Ambulatory Status: Walker Discharge Destination: Home Transportation: Private Auto Accompanied By: self Schedule Follow-up Appointment: Yes Clinical Summary of Care: Electronic Signature(s) Signed: 09/27/2020 5:52:09 PM By: Deon Pilling Entered By: Deon Pilling on 09/27/2020 10:31:53 -------------------------------------------------------------------------------- Vitals Details Patient Name: Date of Service: [name], [name] 09/27/2020 8:00 A M Medical Record Number: [id number] Patient Account Number: [id number] Date of Birth/Sex: Treating RN: [birth year] (70 y.o. [name] Primary Care Greenleigh Kauth: [name], [place] Other Clinician: Referring Vivien Barretto: Treating Winry Egnew/Extender: Doyle Askew, [name] Weeks in Treatment: 7 Vital Signs Time Taken: 07:45 Temperature (F): 98.2 Height (in): 70 Pulse (bpm): 75 Weight (lbs): 195 Respiratory Rate (breaths/min): 20 Body Mass Index (BMI): 28 Blood Pressure (mmHg): 110/52 Capillary Blood Glucose (mg/dl): 149 Reference Range: 80 - 120 mg / dl Electronic Signature(s) Signed: 09/27/2020 5:52:09 PM By: Deon Pilling Entered By: Deon Pilling on 09/27/2020 09:44:05

## 2020-09-27 NOTE — Progress Notes (Signed)
TITUS, DRONE ([id number]) Visit Report for 09/27/2020 HBO Details Patient Name: Date of Service: [name], [name] 09/27/2020 8:00 A M Medical Record Number: [id number] Patient Account Number: [id number] Date of Birth/Sex: Treating RN: 25-Oct-1950 (70 y.o. [name] Primary Care [name]: [name], [place] TA LIE Other Clinician: Referring [name]: Treating [name], [name] Weeks in Treatment: 7 HBO Treatment Course Details Treatment Course Number: 1 Ordering Jasmon Mattice: Linton Ham T Treatments Ordered: otal 40 HBO Treatment Start Date: 08/14/2020 HBO Indication: Late Effect of Radiation Notes: Radiation Cystitis HBO Treatment Details Treatment Number: 29 Patient Type: Outpatient Chamber Type: Monoplace Chamber Serial #: [id number] Treatment Protocol: 2.5 ATA with 90 minutes oxygen, with two 5 minute air breaks Treatment Details Compression Rate Down: 2.0 psi / minute De-Compression Rate Up: 2.5 psi / minute A breaks and breathing ir Compress Tx Pressure periods Decompress Decompress Begins Reached (leave unused spaces Begins Ends blank) Chamber Pressure (ATA 1 2.5 2.5 2.5 2.5 2.5 - - 2.5 1 ) Clock Time (24 hr) 08:12 08:24 08:54 08:59 09:29 09:34 - - 10:04 10:11 Treatment Length: 119 (minutes) Treatment Segments: 4 Vital Signs Capillary Blood Glucose Reference Range: 80 - 120 mg / dl HBO Diabetic Blood Glucose Intervention Range: <131 mg/dl or >249 mg/dl Time Vitals Blood Respiratory Capillary Blood Glucose Pulse Action Type: Pulse: Temperature: Taken: Pressure: Rate: Glucose (mg/dl): Meter #: Oximetry (%) Taken: Pre 07:45 110/52 75 20 98.2 149 Post 10:12 156/63 66 16 97.4 128 Treatment Response Treatment Toleration: Well Treatment Completion Status: Treatment Completed without Adverse Event Treatment Notes Patient reports while traveling has urinated. Once removed from chamber bloody urine noted on scrubs. PA made aware of bloody  urine. Electronic Signature(s) Signed: 09/27/2020 4:01:47 PM By: Worthy Keeler PA-C Signed: 09/27/2020 5:52:09 PM By: Deon Pilling Entered By: Deon Pilling on 09/27/2020 10:31:26 -------------------------------------------------------------------------------- HBO Safety Checklist Details Patient Name: Date of Service: [name], Jon 09/27/2020 8:00 A M Medical Record Number: [id number] Patient Account Number: [id number] Date of Birth/Sex: Treating RN: [birth year] (70 y.o. [name] Primary Care Sophee Mckimmy: [name], [place] Other Clinician: Referring Marvis Saefong: Treating Miko Markwood/Extender: Doyle Askew, [name] Weeks in Treatment: 7 HBO Safety Checklist Items Safety Checklist Consent Form Signed Patient voided / foley secured and emptied When did you last eato this morning jimmy dean breakfast sandwich Last dose of injectable or oral agent weekly on Friday Ostomy pouch emptied and vented if applicable NA All implantable devices assessed, documented and approved NA Intravenous access site secured and place NA Valuables secured Linens and cotton and cotton/polyester blend (less than 51% polyester) Personal oil-based products / skin lotions / body lotions removed NA Wigs or hairpieces removed NA Smoking or tobacco materials removed NA Books / newspapers / magazines / loose paper removed NA Cologne, aftershave, perfume and deodorant removed Jewelry removed (may wrap wedding band) Make-up removed NA Hair care products removed NA Battery operated devices (external) removed NA Heating patches and chemical warmers removed NA Titanium eyewear removed Nail polish cured greater than 10 hours NA Casting material cured greater than 10 hours NA Hearing aids removed NA Loose dentures or partials removed NA Prosthetics have been removed NA Patient demonstrates correct use of air break device (if applicable) Patient concerns have been addressed Patient  grounding bracelet on and cord attached to chamber Specifics for Inpatients (complete in addition to above) Medication sheet sent with patient Intravenous medications needed or due during therapy sent with patient Drainage tubes (e.g. nasogastric  tube or chest tube secured and vented) Endotracheal or Tracheotomy tube secured Cuff deflated of air and inflated with saline Airway suctioned Electronic Signature(s) Signed: 09/27/2020 5:52:09 PM By: Deon Pilling Entered By: Deon Pilling on 09/27/2020 09:45:46

## 2020-09-28 ENCOUNTER — Encounter (HOSPITAL_BASED_OUTPATIENT_CLINIC_OR_DEPARTMENT_OTHER): Payer: Medicare Other | Admitting: Internal Medicine

## 2020-09-28 ENCOUNTER — Encounter (HOSPITAL_COMMUNITY): Payer: Self-pay | Admitting: Emergency Medicine

## 2020-09-28 DIAGNOSIS — R338 Other retention of urine: Secondary | ICD-10-CM | POA: Diagnosis not present

## 2020-09-28 LAB — CBC
HCT: 33.1 % — ABNORMAL LOW (ref 39.0–52.0)
Hemoglobin: 10.3 g/dL — ABNORMAL LOW (ref 13.0–17.0)
MCH: 27.6 pg (ref 26.0–34.0)
MCHC: 31.1 g/dL (ref 30.0–36.0)
MCV: 88.7 fL (ref 80.0–100.0)
Platelets: 190 10*3/uL (ref 150–400)
RBC: 3.73 MIL/uL — ABNORMAL LOW (ref 4.22–5.81)
RDW: 19.3 % — ABNORMAL HIGH (ref 11.5–15.5)
WBC: 11 10*3/uL — ABNORMAL HIGH (ref 4.0–10.5)
nRBC: 0 % (ref 0.0–0.2)

## 2020-09-28 LAB — URINALYSIS, ROUTINE W REFLEX MICROSCOPIC: Bacteria, UA: NONE SEEN

## 2020-09-28 LAB — BASIC METABOLIC PANEL
Anion gap: 14 (ref 5–15)
BUN: 30 mg/dL — ABNORMAL HIGH (ref 8–23)
CO2: 26 mmol/L (ref 22–32)
Calcium: 9.4 mg/dL (ref 8.9–10.3)
Chloride: 96 mmol/L — ABNORMAL LOW (ref 98–111)
Creatinine, Ser: 3.89 mg/dL — ABNORMAL HIGH (ref 0.61–1.24)
GFR, Estimated: 16 mL/min — ABNORMAL LOW (ref >60)
Glucose, Bld: 147 mg/dL — ABNORMAL HIGH (ref 70–99)
Potassium: 3.9 mmol/L (ref 3.5–5.1)
Sodium: 136 mmol/L (ref 135–145)

## 2020-09-28 MED ORDER — LIDOCAINE HCL URETHRAL/MUCOSAL 2 % EX GEL
1.0000 "application " | Freq: Once | CUTANEOUS | Status: AC
  Administered 2020-09-28: 1 via URETHRAL
  Filled 2020-09-28: qty 11

## 2020-09-28 MED ORDER — FENTANYL CITRATE (PF) 100 MCG/2ML IJ SOLN
100.0000 ug | Freq: Once | INTRAMUSCULAR | Status: AC
  Administered 2020-09-28: 100 ug via INTRAVENOUS
  Filled 2020-09-28: qty 2

## 2020-09-28 MED ORDER — ONDANSETRON HCL 4 MG/2ML IJ SOLN
4.0000 mg | Freq: Once | INTRAMUSCULAR | Status: AC
  Administered 2020-09-28: 4 mg via INTRAVENOUS
  Filled 2020-09-28: qty 2

## 2020-09-28 NOTE — Discharge Instructions (Addendum)
Please contact Dr. Irven Shelling office or Dr. Tanna Furry office regarding your return of atrial fibrillation.  We have not restarted you on anticoagulation (blood thinner) at this time due to your bleeding from the urinary tract.  Please contact Dr. [name] regarding that.

## 2020-09-28 NOTE — ED Provider Notes (Addendum)
[facility] DEPT Provider Note: Georgena Spurling, MD, FACEP  CSN: [id number] MRN: [id number] ARRIVAL: 09/27/20 at 2216 ROOM: [place]  Urinary Retention   HISTORY OF PRESENT ILLNESS  09/28/20 3:27 AM [name] is a 70 y.o. male who is undergoing hyperbaric therapy to treat radiation cystitis status posttreatment of prostate cancer.  He had an indwelling catheter removed 6 days ago.  He was passing urine for several days but 3 days ago became unable to pass urine.  He states he has only had blood dripping out of his penis since then.  He is having suprapubic pain which she rates as a 10 out of 10.  Nursing staff reports bedside bladder scan showed 270 mL.   Past Medical History:  Diagnosis Date   Ambulates with cane    straight cane   Cervical myelopathy ([place]) 02/06/2018   Chronic kidney disease    dailysis M W F- home   Complication of anesthesia    Coronary artery disease    Diabetes mellitus without complication ([place])    type2   Diabetic foot ulcer ([place])    Diabetic neuropathy ([place]) 02/06/2018   Gait abnormality 08/24/2018   GERD (gastroesophageal reflux disease)     01/06/20- not current   Gout    History of kidney stones    passed stones   Hypercholesteremia    Hypertension    Hypothyroidism    Neuromuscular disorder (HCC)    neuropathy left leg and bilateral feet   Neuropathy    PONV (postoperative nausea and vomiting)    Prostate cancer (HCC)    PVD (peripheral vascular disease) ([name])    with amputations    Past Surgical History:  Procedure Laterality Date   A-FLUTTER ABLATION N/A 04/06/2020   Procedure: A-FLUTTER ABLATION;  Surgeon: [name], MD;  Location: [facility];  Service: Cardiovascular;  Laterality: N/A;   AMPUTATION Left 12/25/2013   Procedure: AMPUTATION RAY LEFT 5TH RAY;  Surgeon: [name], MD;  Location: [facility];  Service: Orthopedics;  Laterality: Left;   AMPUTATION Right 12/15/2018   Procedure: AMPUTATION OF  4TH AND 5TH TOES RIGHT FOOT;  Surgeon: [name], MD;  Location: [facility];  Service: Orthopedics;  Laterality: Right;   APPLICATION OF WOUND VAC Right 12/15/2018   Procedure: APPLICATION OF WOUND VAC;  Surgeon: [name], MD;  Location: [facility];  Service: Orthopedics;  Laterality: Right;   AV FISTULA PLACEMENT Left 03/25/2019   Procedure: LEFT ARM ARTERIOVENOUS (AV) FISTULA CREATION;  Surgeon: [name], MD;  Location: [facility];  Service: Vascular;  Laterality: Left;   [place] Left 05/20/2019   Procedure: SECOND STAGE LEFT BASCILIC VEIN TRANSPOSITION;  Surgeon: [name], MD;  Location: [facility];  Service: Vascular;  Laterality: Left;   CARDIAC CATHETERIZATION  02/17/2014   CHOLECYSTECTOMY     COLONOSCOPY  2011   in [place], [name]  2008   CYSTOSCOPY N/A 06/29/2020   Procedure: [place];  Surgeon: [name], MD;  Location: [facility];  Service: Urology;  Laterality: N/A;   CYSTOSCOPY WITH FULGERATION Bilateral 06/17/2020   Procedure: CYSTOSCOPY,BILATERAL RETROGRADE, CLOT EVACUATION WITH FULGERATION OF THE BLADDER;  Surgeon: Janith Lima, MD;  Location: [facility];  Service: Urology;  Laterality: Bilateral;   CYSTOSCOPY WITH FULGERATION N/A 06/28/2020   Procedure: CYSTOSCOPY WITH CLOT EVACUATION AND  FULGERATION OF BLEEDERS;  Surgeon: [name], MD;  Location: [facility];  Service: Urology;  Laterality: N/A;  1 HR   I & D EXTREMITY Right 12/15/2018   Procedure: DEBRIDEMENT RIGHT FOOT;  Surgeon: [name], MD;  Location: [facility];  Service: Orthopedics;  Laterality: Right;   I & D EXTREMITY Right 08/27/2019   Procedure: PARTIAL CUBOID EXCISION RIGHT FOOT;  Surgeon: [name], MD;  Location: [facility];  Service: Orthopedics;  Laterality: Right;   I & D EXTREMITY Right 01/07/2020   Procedure: RIGHT FOOT EXCISION INFECTED BONE;  Surgeon: [name], MD;  Location: [facility];   Service: Orthopedics;  Laterality: Right;   NECK SURGERY     novemver 2019   TRANSURETHRAL RESECTION OF PROSTATE N/A 06/28/2020   Procedure: TRANSURETHRAL RESECTION OF THE PROSTATE (TURP);  Surgeon: [name], MD;  Location: [facility];  Service: Urology;  Laterality: N/A;   WISDOM TOOTH EXTRACTION      Family History  Problem Relation Age of Onset   Diabetes Mellitus II Mother    Kidney disease Mother    Diabetes Mellitus II Father    CAD Father    Cancer Father        prostate   Kidney disease Father    Diabetes Mellitus II Brother    Kidney disease Brother    Diabetes Mellitus II Brother    Stomach cancer Brother 37   Kidney disease Brother    Colon cancer Neg Hx    Colon polyps Neg Hx    Esophageal cancer Neg Hx    Rectal cancer Neg Hx    Pancreatic cancer Neg Hx     Social History   Tobacco Use   Smoking status: Former    Pack years: 0.00    Types: Cigars    Start date: 04/08/1973    Quit date: 12/24/1988    Years since quitting: 31.7   Smokeless tobacco: Never   Tobacco comments:    Cigars and Pipe   Vaping Use   Vaping Use: Never used  Substance Use Topics   Alcohol use: No   Drug use: No    Prior to Admission medications   Medication Sig Start Date End Date Taking? Authorizing Provider  acetaminophen (TYLENOL) 500 MG tablet Take 1,000-2,000 mg by mouth 2 (two) times daily as needed for moderate pain.    [provider]  allopurinol (ZYLOPRIM) 100 MG tablet Take 200 mg by mouth daily.  05/04/18   [provider]  B Complex-C-Zn-Folic Acid (DIALYVITE 888 WITH ZINC) 0.8 MG TABS Take 1 tablet by mouth daily. 12/23/19   [provider]  calcitRIOL (ROCALTROL) 0.5 MCG capsule Take 0.5 mcg by mouth daily.  08/11/19   [provider]  carvedilol (COREG) 12.5 MG tablet Take 12.5 mg by mouth in the morning and at bedtime.    [provider]  famotidine (PEPCID) 20 MG tablet TAKE 1 TABLET BY MOUTH AFTER SUPPER 09/18/20    Tanda Rockers, MD  Lancets (ONETOUCH DELICA PLUS [id number]) [place]  01/17/20   [provider]  levothyroxine (SYNTHROID, LEVOTHROID) 112 MCG tablet Take 112 mcg by mouth daily before breakfast. BRAND NAME SYNTHROID    [provider]  losartan (COZAAR) 100 MG tablet Take 100 mg by mouth daily.    [provider]  oxybutynin (DITROPAN) 5 MG tablet Take 5 mg by mouth daily. 07/25/20   [provider]  pantoprazole (PROTONIX) 40 MG tablet Take 1  tablet (40 mg total) by mouth 2 (two) times daily. 09/14/20   Ghimire, Henreitta Leber, MD  Polyethyl Glycol-Propyl Glycol (LUBRICANT EYE DROPS) 0.4-0.3 % SOLN Place 1-2 drops into both eyes 3 (three) times daily as needed (dry/irritated eyes.).    [provider]  pregabalin (LYRICA) 150 MG capsule Take 1 capsule (150 mg total) by mouth in the morning and at bedtime. 05/09/20   Persons, Bevely Palmer, PA  prochlorperazine (COMPAZINE) 5 MG tablet Take 1 tablet (5 mg total) by mouth 2 (two) times daily. 08/14/20   Irene Shipper, MD  rosuvastatin (CRESTOR) 10 MG tablet Take 10 mg by mouth daily.     [provider]  Semaglutide,0.25 or 0.5MG /DOS, (OZEMPIC, 0.25 OR 0.5 MG/DOSE,) 2 MG/1.5ML SOPN Inject 0.5 mg into the skin once a week.    [provider]  sevelamer carbonate (RENVELA) 800 MG tablet Take 800 mg by mouth 3 (three) times daily. 07/20/20   [provider]    Allergies Mushroom extract complex   REVIEW OF SYSTEMS  Negative except as noted here or in the History of Present Illness.   PHYSICAL EXAMINATION  Initial Vital Signs Blood pressure (!) 130/55, pulse 74, temperature 98.1 F (36.7 C), temperature source Oral, resp. rate 20, SpO2 (!) 88 %.  Examination General: Well-developed, well-nourished male in no acute distress; appearance consistent with age of record HENT: normocephalic; atraumatic Eyes: pupils equal, round and reactive to light; extraocular muscles intact Neck:  supple Heart: Irregular rhythm, A. fib on monitor Lungs: clear to auscultation bilaterally Abdomen: soft; nondistended; suprapubic tenderness; bowel sounds present GU: Tanner V male, circumcised; blood at urethral meatus Extremities: No deformity; full range of motion; pulses normal; dialysis fistula left upper arm with pulse and thrill Neurologic: Awake, alert and oriented; motor function intact in all extremities and symmetric; no facial droop Skin: Warm and dry Psychiatric: Grimacing   RESULTS  Summary of this visit's results, reviewed and interpreted by myself:   EKG Interpretation  Date/Time:    Ventricular Rate:    PR Interval:    QRS Duration:   QT Interval:    QTC Calculation:   R Axis:     Text Interpretation:          Laboratory Studies: Results for orders placed or performed during the hospital encounter of 09/27/20 (from the past 24 hour(s))  Basic metabolic panel     Status: Abnormal   Collection Time: 09/28/20 12:34 AM  Result Value Ref Range   Sodium 136 135 - 145 mmol/L   Potassium 3.9 3.5 - 5.1 mmol/L   Chloride 96 (L) 98 - 111 mmol/L   CO2 26 22 - 32 mmol/L   Glucose, Bld 147 (H) 70 - 99 mg/dL   BUN 30 (H) 8 - 23 mg/dL   Creatinine, Ser 3.89 (H) 0.61 - 1.24 mg/dL   Calcium 9.4 8.9 - 10.3 mg/dL   GFR, Estimated 16 (L) >60 mL/min   Anion gap 14 5 - 15  CBC     Status: Abnormal   Collection Time: 09/28/20 12:34 AM  Result Value Ref Range   WBC 11.0 (H) 4.0 - 10.5 K/uL   RBC 3.73 (L) 4.22 - 5.81 MIL/uL   Hemoglobin 10.3 (L) 13.0 - 17.0 g/dL   HCT 33.1 (L) 39.0 - 52.0 %   MCV 88.7 80.0 - 100.0 fL   MCH 27.6 26.0 - 34.0 pg   MCHC 31.1 30.0 - 36.0 g/dL   RDW 19.3 (H) 11.5 -  15.5 %   Platelets 190 150 - 400 K/uL   nRBC 0.0 0.0 - 0.2 %  Urinalysis, Routine w reflex microscopic     Status: Abnormal   Collection Time: 09/28/20  4:11 AM  Result Value Ref Range   Color, Urine RED (A) YELLOW   APPearance TURBID (A) CLEAR   Specific Gravity, Urine   1.005 - 1.030    TEST NOT REPORTED DUE TO COLOR INTERFERENCE OF URINE PIGMENT   pH  5.0 - 8.0    TEST NOT REPORTED DUE TO COLOR INTERFERENCE OF URINE PIGMENT   Glucose, UA (A) NEGATIVE mg/dL    TEST NOT REPORTED DUE TO COLOR INTERFERENCE OF URINE PIGMENT   Hgb urine dipstick (A) NEGATIVE    TEST NOT REPORTED DUE TO COLOR INTERFERENCE OF URINE PIGMENT   Bilirubin Urine (A) NEGATIVE    TEST NOT REPORTED DUE TO COLOR INTERFERENCE OF URINE PIGMENT   Ketones, ur (A) NEGATIVE mg/dL    TEST NOT REPORTED DUE TO COLOR INTERFERENCE OF URINE PIGMENT   Protein, ur (A) NEGATIVE mg/dL    TEST NOT REPORTED DUE TO COLOR INTERFERENCE OF URINE PIGMENT   Nitrite (A) NEGATIVE    TEST NOT REPORTED DUE TO COLOR INTERFERENCE OF URINE PIGMENT   Leukocytes,Ua (A) NEGATIVE    TEST NOT REPORTED DUE TO COLOR INTERFERENCE OF URINE PIGMENT   Bacteria, UA NONE SEEN NONE SEEN   Imaging Studies: No results found.  ED COURSE and MDM  Nursing notes, initial and subsequent vitals signs, including pulse oximetry, reviewed and interpreted by myself.  Vitals:   09/28/20 0200 09/28/20 0400 09/28/20 0430 09/28/20 0500  BP: (!) 130/55 (!) 161/63 (!) 121/55 (!) 99/56  Pulse: 74 74 71 69  Resp:  14 16 15   Temp:      TempSrc:      SpO2: (!) 88% 92% 92% 91%   Medications  ondansetron (ZOFRAN) injection 4 mg (4 mg Intravenous Given 09/28/20 0343)  fentaNYL (SUBLIMAZE) injection 100 mcg (100 mcg Intravenous Given 09/28/20 0343)  lidocaine (XYLOCAINE) 2 % jelly 1 application (1 application Urethral Given 09/28/20 0343)   3:58 AM Nursing staff placed coud catheter without difficulty.  Catheter draining blood-tinged urine.  Patient's discomfort improved.  5:24 AM Patient's urinary obstruction relieved in his discomfort has resolved.  He is put out about 300 mL of grossly bloody urine.  We have avoided restarting him on anticoagulation for atrial fibrillation in light of his acute bleed.  He was advised of this and that he  will need to follow-up with urology and cardiology promptly.  I have sent notices to his urologist, Dr. [name], his cardiologist, Dr. Einar Gip, and his electrophysiologist, Dr. [name], regarding the need for prompt follow-up.   PROCEDURES  Procedures CRITICAL CARE Performed by: Karen Chafe Elihue Ebert Total critical care time: 30 minutes Critical care time was exclusive of separately billable procedures and treating other patients. Critical care was necessary to treat or prevent imminent or life-threatening deterioration. Critical care was time spent personally by me on the following activities: development of treatment plan with patient and/or surrogate as well as nursing, discussions with consultants, evaluation of patient's response to treatment, examination of patient, obtaining history from patient or surrogate, ordering and performing treatments and interventions, ordering and review of laboratory studies, ordering and review of radiographic studies, pulse oximetry and re-evaluation of patient's condition.   ED DIAGNOSES     ICD-10-CM   1. Acute urinary retention  R33.8  2. Gross hematuria  R31.0     3. Atrial fibrillation with controlled ventricular rate ([place])  I48.91          Bird Swetz, Jenny Reichmann, MD 09/28/20 0526    Shanon Rosser, MD 09/28/20 1025    Shanon Rosser, MD 09/28/20 0530

## 2020-09-28 NOTE — ED Triage Notes (Signed)
Pt arriving POV with urinary retention and hematuria. Pt had catheter removed last Friday. Pt reports he is using hyperbaric chamber due to radiation poisoning in his bladder.

## 2020-09-28 NOTE — ED Notes (Signed)
Patient went home with foley

## 2020-09-29 ENCOUNTER — Encounter (HOSPITAL_BASED_OUTPATIENT_CLINIC_OR_DEPARTMENT_OTHER): Payer: Medicare Other | Admitting: Internal Medicine

## 2020-09-29 ENCOUNTER — Other Ambulatory Visit: Payer: Self-pay

## 2020-09-29 DIAGNOSIS — N3041 Irradiation cystitis with hematuria: Secondary | ICD-10-CM | POA: Diagnosis not present

## 2020-09-29 LAB — URINE CULTURE
Culture: <10000 — AB
Special Requests: NORMAL

## 2020-09-29 LAB — GLUCOSE, CAPILLARY
Glucose-Capillary: 152 mg/dL — ABNORMAL HIGH (ref 70–99)
Glucose-Capillary: 119 mg/dL — ABNORMAL HIGH (ref 70–99)

## 2020-10-02 ENCOUNTER — Other Ambulatory Visit: Payer: Self-pay

## 2020-10-02 ENCOUNTER — Encounter (HOSPITAL_BASED_OUTPATIENT_CLINIC_OR_DEPARTMENT_OTHER): Payer: Medicare Other | Admitting: Internal Medicine

## 2020-10-02 DIAGNOSIS — N3041 Irradiation cystitis with hematuria: Secondary | ICD-10-CM | POA: Diagnosis not present

## 2020-10-02 LAB — GLUCOSE, CAPILLARY
Glucose-Capillary: 159 mg/dL — ABNORMAL HIGH (ref 70–99)
Glucose-Capillary: 126 mg/dL — ABNORMAL HIGH (ref 70–99)

## 2020-10-03 ENCOUNTER — Encounter (HOSPITAL_BASED_OUTPATIENT_CLINIC_OR_DEPARTMENT_OTHER): Payer: Medicare Other | Admitting: Internal Medicine

## 2020-10-03 DIAGNOSIS — Z8546 Personal history of malignant neoplasm of prostate: Secondary | ICD-10-CM | POA: Diagnosis not present

## 2020-10-03 DIAGNOSIS — N3041 Irradiation cystitis with hematuria: Secondary | ICD-10-CM

## 2020-10-03 LAB — GLUCOSE, CAPILLARY
Glucose-Capillary: 193 mg/dL — ABNORMAL HIGH (ref 70–99)
Glucose-Capillary: 103 mg/dL — ABNORMAL HIGH (ref 70–99)

## 2020-10-03 NOTE — Progress Notes (Signed)
ARJAY, JASKIEWICZ ([id number]) Visit Report for 09/29/2020 SuperBill Details Patient Name: Date of Service: [name], [name] 09/29/2020 Medical Record Number: [id number] Patient Account Number: [id number] Date of Birth/Sex: Treating RN: 1950-08-19 (70 y.o. [name] Primary Care Provider: [name], [place] Other Clinician: Referring Provider: Treating Provider/Extender: [name], [name] Weeks in Treatment: 7 Diagnosis Coding ICD-10 Codes Code Description N30.41 Irradiation cystitis with hematuria Z85.46 Personal history of malignant neoplasm of prostate Facility Procedures CPT4 Code Description Modifier Quantity 76226333 G0277-(Facility Use Only) HBOT full body chamber, 60min , 4 Physician Procedures Quantity CPT4 Code Description Modifier [id number] 38937 - WC PHYS HYPERBARIC OXYGEN THERAPY 1 ICD-10 Diagnosis Description N30.41 Irradiation cystitis with hematuria Z85.46 Personal history of malignant neoplasm of prostate Electronic Signature(s) Signed: 09/29/2020 5:20:40 PM By: Linton Ham MD Signed: 10/03/2020 5:39:04 PM By: Levan Hurst RN, BSN Entered By: Levan Hurst on 09/29/2020 13:11:43

## 2020-10-03 NOTE — Progress Notes (Signed)
[name], [name] ([id number]) Visit Report for 10/02/2020 SuperBill Details Patient Name: Date of Service: [name], [name] 10/02/2020 Medical Record Number: [id number] Patient Account Number: [id number] Date of Birth/Sex: Treating RN: 1950-11-06 (70 y.o. [name] Primary Care Provider: [name], [place] Other Clinician: Referring Provider: Treating Provider/Extender: Levin Bacon, [name] Weeks in Treatment: 8 Diagnosis Coding ICD-10 Codes Code Description N30.41 Irradiation cystitis with hematuria Z85.46 Personal history of malignant neoplasm of prostate Facility Procedures CPT4 Code Description Modifier Quantity 07867544 G0277-(Facility Use Only) HBOT full body chamber, 45min , 4 Physician Procedures Quantity CPT4 Code Description Modifier [id number] 12197 - WC PHYS HYPERBARIC OXYGEN THERAPY 1 ICD-10 Diagnosis Description N30.41 Irradiation cystitis with hematuria Electronic Signature(s) Signed: 10/03/2020 3:16:41 PM By: Kalman Shan DO Signed: 10/03/2020 4:38:16 PM By: Deon Pilling Entered By: Deon Pilling on 10/02/2020 10:42:55

## 2020-10-03 NOTE — Progress Notes (Signed)
[name], [name] ([id number]) Visit Report for 10/02/2020 Arrival Information Details Patient Name: Date of Service: [name], [name] 10/02/2020 8:00 A M Medical Record Number: [id number] Patient Account Number: [id number] Date of Birth/Sex: Treating RN: Jan 03, 1951 (70 y.o. [name] Primary Care Cheikh Bramble: [name], [place] Other Clinician: Referring Lavine Hargrove: Treating Jakeline Dave/Extender: Levin Bacon, [name] Weeks in Treatment: 8 Visit Information History Since Last Visit Added or deleted any medications: No Patient Arrived: [place] Any new allergies or adverse reactions: No Arrival Time: 07:40 Had a fall or experienced change in No Accompanied By: self activities of daily living that may affect Transfer Assistance: None risk of falls: Patient Identification Verified: Yes Signs or symptoms of abuse/neglect since last visito No Secondary Verification Process Completed: Yes Hospitalized since last visit: No Patient Requires Transmission-Based Precautions: No Implantable device outside of the clinic excluding No Patient Has Alerts: Yes cellular tissue based products placed in the center Patient Alerts: BP Right Arm Only since last visit: Pain Present Now: No Notes Per patient no blood in urine noted in catheter bag this morning. Per patient follows up with urologist on Friday. Electronic Signature(s) Signed: 10/03/2020 4:38:16 PM By: Deon Pilling Entered By: Deon Pilling on 10/02/2020 09:07:38 -------------------------------------------------------------------------------- Encounter Discharge Information Details Patient Name: Date of Service: [name], [name] 10/02/2020 8:00 A M Medical Record Number: [id number] Patient Account Number: [id number] Date of Birth/Sex: Treating RN: 10-Sep-1950 (70 y.o. [name] Primary Care Florence Antonelli: [name], [place] Other Clinician: Referring Eudell Mcphee: Treating Bentleigh Stankus/Extender: Levin Bacon, [name] Weeks in Treatment:  8 Encounter Discharge Information Items Discharge Condition: Stable Ambulatory Status: [place] Discharge Destination: Home Transportation: Private Auto Accompanied By: self Schedule Follow-up Appointment: Yes Clinical Summary of Care: Electronic Signature(s) Signed: 10/03/2020 4:38:16 PM By: Deon Pilling Entered By: Deon Pilling on 10/02/2020 10:43:27 -------------------------------------------------------------------------------- Patient/Caregiver Education Details Patient Name: Date of Service: [name], [name] 6/27/2022andnbsp8:00 [place] Record Number: [id number] Patient Account Number: [id number] Date of Birth/Gender: Treating RN: May 19, 1950 (70 y.o. [name] Primary Care Physician: [name], [place] TA LIE Other Clinician: Referring Physician: Treating Physician/Extender: Levin Bacon, [name] Weeks in Treatment: 8 Education Assessment Education Provided To: Patient Education Topics Provided Elevated Blood Sugar/ Impact on Healing: Handouts: Elevated Blood Sugars: How Do They Affect Wound Healing Methods: Explain/Verbal Responses: Reinforcements needed Electronic Signature(s) Signed: 10/03/2020 4:38:16 PM By: Deon Pilling Entered By: Deon Pilling on 10/02/2020 10:43:10 -------------------------------------------------------------------------------- Vitals Details Patient Name: Date of Service: [name], [name] 10/02/2020 8:00 A M Medical Record Number: [id number] Patient Account Number: [id number] Date of Birth/Sex: Treating RN: Jun 18, 1950 (70 y.o. [name] Primary Care Maddux First: [name], [place] Other Clinician: Referring Shuan Statzer: Treating Eliah Ozawa/Extender: Levin Bacon, [name] Weeks in Treatment: 8 Vital Signs Time Taken: 07:40 Temperature (F): 98.6 Height (in): 70 Pulse (bpm): 72 Weight (lbs): 195 Respiratory Rate (breaths/min): 20 Body Mass Index (BMI): 28 Blood Pressure (mmHg): 125/49 Capillary Blood Glucose (mg/dl):  159 Reference Range: 80 - 120 mg / dl Electronic Signature(s) Signed: 10/03/2020 4:38:16 PM By: Deon Pilling Entered By: Deon Pilling on 10/02/2020 09:07:58

## 2020-10-03 NOTE — Progress Notes (Signed)
JAXTON, CASALE ([id number]) Visit Report for 09/29/2020 HBO Details Patient Name: Date of Service: TELLY, JAWAD 09/29/2020 8:00 A M Medical Record Number: [id number] Patient Account Number: [id number] Date of Birth/Sex: Treating RN: 11-Sep-1950 (70 y.o. [name] Primary Care Kayston Jodoin: [name], [place] Other Clinician: Referring Toryn Mcclinton: Treating Ardyn Forge/Extender: [name], [name] Weeks in Treatment: 7 HBO Treatment Course Details Treatment Course Number: 1 Ordering Lindel Marcell: Linton Ham T Treatments Ordered: otal 40 HBO Treatment Start Date: 08/14/2020 HBO Indication: Late Effect of Radiation Notes: Radiation Cystitis HBO Treatment Details Treatment Number: 30 Patient Type: Outpatient Chamber Type: Monoplace Chamber Serial #: [id number] Treatment Protocol: 2.5 ATA with 90 minutes oxygen, with two 5 minute air breaks Treatment Details Compression Rate Down: 2.0 psi / minute De-Compression Rate Up: 2.5 psi / minute A breaks and breathing ir Compress Tx Pressure periods Decompress Decompress Begins Reached (leave unused spaces Begins Ends blank) Chamber Pressure (ATA 1 2.5 2.5 2.5 2.5 2.5 - - 2.5 1 ) Clock Time (24 hr) 07:58 08:10 08:40 08:45 09:15 09:20 - - 09:50 10:00 Treatment Length: 122 (minutes) Treatment Segments: 4 Vital Signs Capillary Blood Glucose Reference Range: 80 - 120 mg / dl HBO Diabetic Blood Glucose Intervention Range: <131 mg/dl or >249 mg/dl Time Vitals Blood Respiratory Capillary Blood Glucose Pulse Action Type: Pulse: Temperature: Taken: Pressure: Rate: Glucose (mg/dl): Meter #: Oximetry (%) Taken: Pre 07:41 96/45 74 16 98.4 152 Post 10:00 137/50 65 18 97.7 119 Treatment Response Treatment Completion Status: Treatment Completed without Adverse Event Electronic Signature(s) Signed: 09/29/2020 5:20:40 PM By: Linton Ham MD Signed: 10/03/2020 5:39:04 PM By: Levan Hurst RN, BSN Entered By: Levan Hurst on 09/29/2020  13:11:36 -------------------------------------------------------------------------------- HBO Safety Checklist Details Patient Name: Date of Service: [name], [place] 09/29/2020 8:00 A M Medical Record Number: [id number] Patient Account Number: [id number] Date of Birth/Sex: Treating RN: Dec 04, 1950 (69 y.o. [name] Primary Care Zakaiya Lares: [name], [place] Other Clinician: Referring Jahmai Finelli: Treating Luismario Coston/Extender: [name], [name] Weeks in Treatment: 7 HBO Safety Checklist Items Safety Checklist Consent Form Signed Patient voided / foley secured and emptied When did you last eato 0630 - sausage biscuit Last dose of injectable or oral agent na Ostomy pouch emptied and vented if applicable NA All implantable devices assessed, documented and approved NA Intravenous access site secured and place NA Valuables secured Linens and cotton and cotton/polyester blend (less than 51% polyester) Personal oil-based products / skin lotions / body lotions removed Wigs or hairpieces removed Smoking or tobacco materials removed Books / newspapers / magazines / loose paper removed Cologne, aftershave, perfume and deodorant removed Jewelry removed (may wrap wedding band) Make-up removed Hair care products removed Battery operated devices (external) removed Heating patches and chemical warmers removed Titanium eyewear removed Nail polish cured greater than 10 hours NA Casting material cured greater than 10 hours NA Hearing aids removed NA Loose dentures or partials removed NA Prosthetics have been removed Patient demonstrates correct use of air break device (if applicable) Patient concerns have been addressed Patient grounding bracelet on and cord attached to chamber Specifics for Inpatients (complete in addition to above) Medication sheet sent with patient NA Intravenous medications needed or due during therapy sent with patient NA Drainage tubes (e.g.  nasogastric tube or chest tube secured and vented) NA Endotracheal or Tracheotomy tube secured NA Cuff deflated of air and inflated with saline NA Airway suctioned NA Electronic Signature(s) Signed: 10/03/2020 5:39:04 PM By: Levan Hurst RN, BSN Entered  By: Levan Hurst on 09/29/2020 09:57:59

## 2020-10-03 NOTE — Progress Notes (Signed)
Jon, [name] ([id number]) Visit Report for 10/02/2020 [name]: Date of Service: [name], [name] 10/02/2020 8:00 A M Medical Record Number: [id number] Jon Account Number: [id number] Date of Birth/Sex: Treating RN: April 09, 1950 (70 y.o. 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 Jon Treatment Details Treatment Number: 31 Jon Type: Outpatient Chamber Type: Monoplace Chamber Serial #: [id number] Treatment Protocol: 2.5 ATA with 90 minutes oxygen, with two 5 minute air breaks Treatment Details Compression Rate Down: 2.0 psi / minute De-Compression Rate Up: 2.5 psi / minute A breaks and breathing ir Compress Tx Pressure periods Decompress Decompress Begins Reached (leave unused spaces Begins Ends blank) Chamber Pressure (ATA 1 2.5 2.5 2.5 2.5 2.5 - - 2.5 1 ) Clock Time (24 hr) 08:14 08:26 08:56 09:01 09:31 09:36 - - 10:06 10:14 Treatment Length: 120 (minutes) Treatment Segments: 4 Vital Signs Capillary Blood Glucose Reference Range: 80 - 120 mg / dl Jon Diabetic Blood Glucose Intervention Range: <131 mg/dl or >249 mg/dl Time Vitals Blood Respiratory Capillary Blood Glucose Pulse Action Type: Pulse: Temperature: Taken: Pressure: Rate: Glucose (mg/dl): Meter #: Oximetry (%) Taken: Pre 07:40 125/49 72 20 98.6 159 Post 10:15 145/65 62 16 97.4 126 Treatment Response Treatment Toleration: Well Treatment Completion Status: Treatment Completed without Adverse Event Physician Jon Attestation: I certify that I supervised this Jon treatment in accordance with Medicare guidelines. A trained emergency response team is  readily available per Yes hospital policies and procedures. Continue HBOT as ordered. Yes Electronic Signature(s) Signed: 10/03/2020 3:44:13 PM By: Jon Shan DO Previous Signature: 10/03/2020 3:16:41 PM Version By: Jon Shan DO Entered By: [name] on 10/03/2020 15:43:19 -------------------------------------------------------------------------------- Jon Safety Checklist Details [name]: Date of Service: [name], Jon 10/02/2020 8:00 A M Medical Record Number: [id number] Jon Account Number: [id number] Date of Birth/Sex: Treating RN: 1951-02-17 (70 y.o. [name] Primary Care [name]: [name], [place] Other Clinician: Referring [name]: Treating [name]/Extender: [name], [name] Weeks in Treatment: 8 Jon Safety Checklist Items Safety Checklist Consent Form Signed Jon voided / foley secured and emptied When did you last eato this morning jimmy dean breakfast sandwich Last dose of injectable or oral agent weekly on Friday Ostomy pouch emptied and vented if applicable NA All implantable devices assessed, documented and approved NA Intravenous access site secured and place NA Valuables secured Linens and cotton and cotton/polyester blend (less than 51% polyester) Personal oil-based products / skin lotions / body lotions removed NA Wigs or hairpieces removed NA Smoking or tobacco materials removed NA Books / newspapers / magazines / loose paper removed NA Cologne, aftershave, perfume and deodorant removed Jewelry removed (may wrap wedding band) Make-up removed NA Hair care products removed NA Battery operated devices (external) removed NA Heating patches and chemical warmers removed NA Titanium eyewear removed NA Nail polish cured greater than 10 hours Casting material cured greater than 10 hours NA Hearing aids removed NA Loose dentures or partials removed NA Prosthetics have been removed NA Jon  demonstrates correct use of air break device (if applicable) Jon concerns have been addressed Jon grounding bracelet on and cord attached to chamber Specifics for Inpatients (complete in addition to above) Medication sheet sent with Jon Intravenous medications  needed or due during therapy sent with Jon Drainage tubes (e.g. nasogastric tube or chest tube secured and vented) Endotracheal or Tracheotomy tube secured Cuff deflated of air and inflated with saline Airway suctioned Electronic Signature(s) Signed: 10/03/2020 4:38:16 PM By: [name] Entered By: [name] on 10/02/2020 09:08:31

## 2020-10-03 NOTE — Progress Notes (Signed)
[name], [name] ([id number]) Visit Report for 10/03/2020 Arrival Information Details Patient Name: Date of Service: [name], [name] 10/03/2020 8:00 A M Medical Record Number: [id number] Patient Account Number: [id number] Date of Birth/Sex: Treating RN: 12-10-1950 (70 y.o. [name] Primary Care Johnross Nabozny: [name], [place] Other Clinician: Referring Crucita Lacorte: Treating Fabyan Loughmiller/Extender: Levin Bacon, [name] Weeks in Treatment: 8 Visit Information History Since Last Visit Added or deleted any medications: No Patient Arrived: Walker Any new allergies or adverse reactions: No Arrival Time: 07:40 Had a fall or experienced change in No Accompanied By: alone activities of daily living that may affect Transfer Assistance: None risk of falls: Patient Identification Verified: Yes Signs or symptoms of abuse/neglect since last visito No Secondary Verification Process Completed: Yes Hospitalized since last visit: No Patient Requires Transmission-Based Precautions: No Implantable device outside of the clinic excluding No Patient Has Alerts: Yes cellular tissue based products placed in the center Patient Alerts: BP Right Arm Only since last visit: Pain Present Now: No Electronic Signature(s) Signed: 10/03/2020 5:39:04 PM By: Levan Hurst RN, BSN Entered By: Levan Hurst on 10/03/2020 11:11:37 -------------------------------------------------------------------------------- Encounter Discharge Information Details Patient Name: Date of Service: [name], [name] 10/03/2020 8:00 [name] Record Number: [id number] Patient Account Number: [id number] Date of Birth/Sex: Treating RN: 1950-05-26 (70 y.o. [name] Primary Care Erhard Senske: [name], [place] Other Clinician: Referring Siobhan Zaro: Treating Alpheus Stiff/Extender: Levin Bacon, [name] Weeks in Treatment: 8 Encounter Discharge Information Items Discharge Condition: Stable Ambulatory Status: Walker Discharge  Destination: Home Transportation: Private Auto Accompanied By: alone Schedule Follow-up Appointment: Yes Clinical Summary of Care: Patient Declined Electronic Signature(s) Signed: 10/03/2020 5:39:04 PM By: Levan Hurst RN, BSN Entered By: Levan Hurst on 10/03/2020 11:14:16 -------------------------------------------------------------------------------- Vitals Details Patient Name: Date of Service: [name], [name] 10/03/2020 8:00 [place] Number: [id number] Patient Account Number: [id number] Date of Birth/Sex: Treating RN: [birth year] (70 y.o. [name] Primary Care Gratia Disla: [name], [place] Other Clinician: Referring Shanelle Clontz: Treating Chelsee Hosie/Extender: Levin Bacon, [name] Weeks in Treatment: 8 Vital Signs Time Taken: 07:40 Temperature (F): 98.1 Height (in): 70 Pulse (bpm): 66 Weight (lbs): 195 Respiratory Rate (breaths/min): 18 Body Mass Index (BMI): 28 Blood Pressure (mmHg): 153/60 Capillary Blood Glucose (mg/dl): 193 Reference Range: 80 - 120 mg / dl Electronic Signature(s) Signed: 10/03/2020 5:39:04 PM By: Levan Hurst RN, BSN Entered By: Levan Hurst on 10/03/2020 11:11:58

## 2020-10-03 NOTE — Progress Notes (Signed)
SI, JACHIM ([id number]) Visit Report for 10/03/2020 SuperBill Details Patient Name: Date of Service: [name], [name] 10/03/2020 Medical Record Number: [id number] Patient Account Number: [id number] Date of Birth/Sex: Treating RN: 1950/08/02 (70 y.o. 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

## 2020-10-03 NOTE — Progress Notes (Signed)
BERL, BONFANTI ([id number]) Visit Report for 09/29/2020 Arrival Information Details Patient Name: Date of Service: [name], [name] 09/29/2020 8:00 A M Medical Record Number: [id number] Patient Account Number: [id number] Date of Birth/Sex: Treating RN: [birth year] (70 y.o. [name] Primary Care Hady Niemczyk: [name], [place] Other Clinician: Referring Cynthya Yam: Treating Dennard Vezina/Extender: [name], [name] Weeks in Treatment: 7 Visit Information History Since Last Visit Added or deleted any medications: No Patient Arrived: [name] Any new allergies or adverse reactions: No Arrival Time: 09:56 Had a fall or experienced change in No Accompanied By: alone activities of daily living that may affect Transfer Assistance: None risk of falls: Patient Identification Verified: Yes Signs or symptoms of abuse/neglect since last visito No Secondary Verification Process Completed: Yes Hospitalized since last visit: No Patient Requires Transmission-Based Precautions: No Implantable device outside of the clinic excluding No Patient Has Alerts: Yes cellular tissue based products placed in the center Patient Alerts: BP Right Arm Only since last visit: Pain Present Now: No Electronic Signature(s) Signed: 10/03/2020 5:39:04 PM By: Levan Hurst RN, BSN Entered By: Levan Hurst on 09/29/2020 09:56:58 -------------------------------------------------------------------------------- Encounter Discharge Information Details Patient Name: Date of Service: [name], [name] 09/29/2020 8:00 [place] Record Number: [id number] Patient Account Number: [id number] Date of Birth/Sex: Treating RN: 1950-12-02 (70 y.o. [name] Primary Care Artavious Trebilcock: [name], [place] Other Clinician: Referring Zanaiya Calabria: Treating Derreck Wiltsey/Extender: [name], [name] Weeks in Treatment: 7 Encounter Discharge Information Items Discharge Condition: Stable Ambulatory Status: Walker Discharge  Destination: Home Transportation: Private Auto Accompanied By: alone Schedule Follow-up Appointment: Yes Clinical Summary of Care: Patient Declined Electronic Signature(s) Signed: 10/03/2020 5:39:04 PM By: Levan Hurst RN, BSN Entered By: Levan Hurst on 09/29/2020 13:13:09 -------------------------------------------------------------------------------- Vitals Details Patient Name: Date of Service: [name], [place] 09/29/2020 8:00 A M Medical Record Number: [id number] Patient Account Number: [id number] Date of Birth/Sex: Treating RN: May 07, 1950 (70 y.o. [name] Primary Care Kayslee Furey: [name], [place] Other Clinician: Referring Damon Baisch: Treating Kiyon Fidalgo/Extender: [name], [name] Weeks in Treatment: 7 Vital Signs Time Taken: 07:41 Temperature (F): 98.4 Height (in): 70 Pulse (bpm): 74 Weight (lbs): 195 Respiratory Rate (breaths/min): 16 Body Mass Index (BMI): 28 Blood Pressure (mmHg): 96/45 Capillary Blood Glucose (mg/dl): 152 Reference Range: 80 - 120 mg / dl Electronic Signature(s) Signed: 10/03/2020 5:39:04 PM By: Levan Hurst RN, BSN Entered By: Levan Hurst on 09/29/2020 09:57:18

## 2020-10-04 ENCOUNTER — Encounter (HOSPITAL_BASED_OUTPATIENT_CLINIC_OR_DEPARTMENT_OTHER): Payer: Medicare Other | Admitting: Physician Assistant

## 2020-10-04 ENCOUNTER — Other Ambulatory Visit: Payer: Self-pay

## 2020-10-04 DIAGNOSIS — N3041 Irradiation cystitis with hematuria: Secondary | ICD-10-CM | POA: Diagnosis not present

## 2020-10-04 LAB — GLUCOSE, CAPILLARY
Glucose-Capillary: 161 mg/dL — ABNORMAL HIGH (ref 70–99)
Glucose-Capillary: 122 mg/dL — ABNORMAL HIGH (ref 70–99)

## 2020-10-04 NOTE — Progress Notes (Signed)
[name], [name] ([id number]) Visit Report for 10/04/2020 HBO Details Patient Name: Date of Service: [name], [name] 10/04/2020 8:00 A M Medical Record Number: [id number] Patient Account Number: [id number] Date of Birth/Sex: Treating RN: November 23, 1950 (70 y.o. [name] Primary Care [name]: [name], [name] Other Clinician: Referring [name]: Treating Arhan Mcmanamon/Extender: Doyle Askew, NA TA [name] Weeks in Treatment: 8 HBO Treatment Course Details Treatment Course Number: 1 Ordering [name]: [name] T Treatments Ordered: otal 40 HBO Treatment Start Date: 08/14/2020 HBO Indication: Late Effect of Radiation Notes: Radiation Cystitis HBO Treatment Details Treatment Number: 33 Patient Type: Outpatient Chamber Type: Monoplace Chamber Serial #: [id number] Treatment Protocol: 2.5 ATA with 90 minutes oxygen, with two 5 minute air breaks Treatment Details Compression Rate Down: 2.0 psi / minute De-Compression Rate Up: 2.5 psi / minute A breaks and breathing ir Compress Tx Pressure periods Decompress Decompress Begins Reached (leave unused spaces Begins Ends blank) Chamber Pressure (ATA 1 2.5 2.5 2.5 2.5 2.5 - - 2.5 1 ) Clock Time (24 hr) 07:55 08:08 08:38 08:43 09:13 09:18 - - 09:48 09:58 Treatment Length: 123 (minutes) Treatment Segments: 4 Vital Signs Capillary Blood Glucose Reference Range: 80 - 120 mg / dl HBO Diabetic Blood Glucose Intervention Range: <131 mg/dl or >249 mg/dl Time Vitals Blood Respiratory Capillary Blood Glucose Pulse Action Type: Pulse: Temperature: Taken: Pressure: Rate: Glucose (mg/dl): Meter #: Oximetry (%) Taken: Pre 07:40 154/56 82 18 98.1 161 Post 10:00 154/62 67 18 97.5 122 Treatment Response Treatment Toleration: Well Treatment Completion Status: Treatment Completed without Adverse Event Physician HBO Attestation: I certify that I supervised this HBO treatment in accordance with Medicare guidelines. A trained emergency response team is  readily available per Yes hospital policies and procedures. Continue HBOT as ordered. Yes Electronic Signature(s) Signed: 10/04/2020 4:37:01 PM By: Worthy Keeler PA-C Previous Signature: 10/04/2020 4:36:36 PM Version By: Worthy Keeler PA-C Entered By: Worthy Keeler on 10/04/2020 16:37:01 -------------------------------------------------------------------------------- HBO Safety Checklist Details Patient Name: Date of Service: [name], [name] 10/04/2020 8:00 A M Medical Record Number: [id number] Patient Account Number: [id number] Date of Birth/Sex: Treating RN: 03-28-1951 (70 y.o. [name] Primary Care Sonni [name]: [name], [place] Other Clinician: Referring [name]: Treating [name]/Extender: Doyle Askew, NA TA [name] Weeks in Treatment: 8 HBO Safety Checklist Items Safety Checklist Consent Form Signed Patient voided / foley secured and emptied When did you last eato this morning jimmy dean sandwich Last dose of injectable or oral agent weekly Friday Ostomy pouch emptied and vented if applicable NA All implantable devices assessed, documented and approved NA Intravenous access site secured and place NA Valuables secured Linens and cotton and cotton/polyester blend (less than 51% polyester) Personal oil-based products / skin lotions / body lotions removed NA Wigs or hairpieces removed NA Smoking or tobacco materials removed NA Books / newspapers / magazines / loose paper removed NA Cologne, aftershave, perfume and deodorant removed Jewelry removed (may wrap wedding band) Make-up removed NA Hair care products removed NA Battery operated devices (external) removed NA Heating patches and chemical warmers removed NA Titanium eyewear removed Nail polish cured greater than 10 hours NA Casting material cured greater than 10 hours NA Hearing aids removed NA Loose dentures or partials removed NA Prosthetics have been removed NA Patient demonstrates  correct use of air break device (if applicable) Patient concerns have been addressed Patient grounding bracelet on and cord attached to chamber Specifics for Inpatients (complete in addition to above) Medication sheet sent with  patient Intravenous medications needed or due during therapy sent with patient Drainage tubes (e.g. nasogastric tube or chest tube secured and vented) Endotracheal or Tracheotomy tube secured Cuff deflated of air and inflated with saline Airway suctioned Electronic Signature(s) Signed: 10/04/2020 6:10:10 PM By: Deon Pilling Entered By: Deon Pilling on 10/04/2020 12:20:05

## 2020-10-04 NOTE — Progress Notes (Signed)
EMRAH, ARIOLA ([id number]) Visit Report for 10/04/2020 Arrival Information Details Patient Name: Date of Service: SHER, HELLINGER 10/04/2020 8:00 A M Medical Record Number: [id number] Patient Account Number: [id number] Date of Birth/Sex: Treating RN: Jul 26, 1950 (70 y.o. [name], [name] Primary Care Kamber Vignola: [name], [place] Other Clinician: Referring Rhyli Depaula: Treating Maveryk Renstrom/Extender: Doyle Askew, [name] Weeks in Treatment: 8 Visit Information History Since Last Visit Added or deleted any medications: No Patient Arrived: Walker Any new allergies or adverse reactions: No Arrival Time: 07:40 Had a fall or experienced change in No Accompanied By: self activities of daily living that may affect Transfer Assistance: None risk of falls: Patient Identification Verified: Yes Signs or symptoms of abuse/neglect since last visito No Secondary Verification Process Completed: Yes Hospitalized since last visit: No Patient Requires Transmission-Based Precautions: No Implantable device outside of the clinic excluding No Patient Has Alerts: Yes cellular tissue based products placed in the center Patient Alerts: BP Right Arm Only since last visit: Pain Present Now: No Electronic Signature(s) Signed: 10/04/2020 6:10:10 PM By: Deon Pilling Entered By: Deon Pilling on 10/04/2020 12:18:22 -------------------------------------------------------------------------------- Encounter Discharge Information Details Patient Name: Date of Service: [name], [place] 10/04/2020 8:00 A M Medical Record Number: [id number] Patient Account Number: [id number] Date of Birth/Sex: Treating RN: 04-Jan-1951 (70 y.o. [name] Primary Care Bernece Gall: [name], [place] Other Clinician: Referring Rainer Mounce: Treating Emilyrose Darrah/Extender: Doyle Askew, [name] Weeks in Treatment: 8 Encounter Discharge Information Items Discharge Condition: Stable Ambulatory Status: Walker Discharge Destination:  Home Transportation: Private Auto Accompanied By: self Schedule Follow-up Appointment: Yes Clinical Summary of Care: Electronic Signature(s) Signed: 10/04/2020 6:10:10 PM By: Deon Pilling Entered By: Deon Pilling on 10/04/2020 12:21:27 -------------------------------------------------------------------------------- Patient/Caregiver Education Details Patient Name: Date of Service: [name], [place] 6/29/2022andnbsp8:00 A M Medical Record Number: [id number] Patient Account Number: [id number] Date of Birth/Gender: Treating RN: 1950/10/10 (70 y.o. [name] Primary Care Physician: [name], [place] TA LIE Other Clinician: Referring Physician: Treating Physician/Extender: Doyle Askew, [name] Weeks in Treatment: 8 Education Assessment Education Provided To: Patient Education Topics Provided Elevated Blood Sugar/ Impact on Healing: Handouts: Elevated Blood Sugars: How Do They Affect Wound Healing Methods: Explain/Verbal Responses: Reinforcements needed Electronic Signature(s) Signed: 10/04/2020 6:10:10 PM By: Deon Pilling Entered By: Deon Pilling on 10/04/2020 12:21:17 -------------------------------------------------------------------------------- Vitals Details Patient Name: Date of Service: [name], [place] 10/04/2020 8:00 A M Medical Record Number: [id number] Patient Account Number: [id number] Date of Birth/Sex: Treating RN: [birth year] (70 y.o. [name] Primary Care Tonyia Marschall: [name], [place] Other Clinician: Referring Xariah Silvernail: Treating Terena Bohan/Extender: Doyle Askew, [name] Weeks in Treatment: 8 Vital Signs Time Taken: 07:40 Temperature (F): 98.1 Height (in): 70 Pulse (bpm): 82 Weight (lbs): 195 Respiratory Rate (breaths/min): 18 Body Mass Index (BMI): 28 Blood Pressure (mmHg): 154/56 Capillary Blood Glucose (mg/dl): 161 Reference Range: 80 - 120 mg / dl Electronic Signature(s) Signed: 10/04/2020 6:10:10 PM By: Deon Pilling Entered  By: Deon Pilling on 10/04/2020 12:18:38

## 2020-10-04 NOTE — Progress Notes (Signed)
BART, ASHFORD ([id number]) Visit Report for 10/04/2020 Problem List Details Patient Name: Date of Service: [name], [name] 10/04/2020 8:00 A M Medical Record Number: [id number] Patient Account Number: [id number] Date of Birth/Sex: Treating RN: 04-07-1951 (70 y.o. [name] Primary Care Provider: [name], [place] Other Clinician: Referring Provider: Treating Provider/Extender: Doyle Askew, [name] Weeks in Treatment: 8 Active Problems ICD-10 Encounter Code Description Active Date MDM Diagnosis N30.41 Irradiation cystitis with hematuria 08/07/2020 No Yes Z85.46 Personal history of malignant neoplasm of prostate 08/07/2020 No Yes Inactive Problems Resolved Problems Electronic Signature(s) Signed: 10/04/2020 4:37:18 PM By: Worthy Keeler PA-C Entered By: Worthy Keeler on 10/04/2020 16:37:17 -------------------------------------------------------------------------------- Super[place] Details Patient Name: Date of Service: [name], [name] 10/04/2020 Medical Record Number: [id number] Patient Account Number: [id number] Date of Birth/Sex: Treating RN: 08/05/1950 (70 y.o. [name] Primary Care Provider: [name], [place] Other Clinician: Referring Provider: Treating Provider/Extender: Doyle Askew, [name] Weeks in Treatment: 8 Diagnosis Coding ICD-10 Codes Code Description N30.41 Irradiation cystitis with hematuria Z85.46 Personal history of malignant neoplasm of prostate Facility Procedures CPT4 Code: [id number] Description: G0277-(Facility Use Only) HBOT full body chamber, 28min , Modifier: Quantity: 4 Physician Procedures : CPT4 Code Description Modifier [id number] - WC PHYS HYPERBARIC OXYGEN THERAPY ICD-10 Diagnosis Description N30.41 Irradiation cystitis with hematuria Quantity: 1 Electronic Signature(s) Signed: 10/04/2020 4:37:13 PM By: Worthy Keeler PA-C Previous Signature: 10/04/2020 4:36:36 PM Version By: Worthy Keeler PA-C Entered By: Worthy Keeler on 10/04/2020 16:37:13

## 2020-10-05 ENCOUNTER — Encounter (HOSPITAL_BASED_OUTPATIENT_CLINIC_OR_DEPARTMENT_OTHER): Payer: Medicare Other | Admitting: Internal Medicine

## 2020-10-05 DIAGNOSIS — N3041 Irradiation cystitis with hematuria: Secondary | ICD-10-CM | POA: Diagnosis not present

## 2020-10-05 LAB — GLUCOSE, CAPILLARY
Glucose-Capillary: 173 mg/dL — ABNORMAL HIGH (ref 70–99)
Glucose-Capillary: 145 mg/dL — ABNORMAL HIGH (ref 70–99)

## 2020-10-06 ENCOUNTER — Encounter (HOSPITAL_BASED_OUTPATIENT_CLINIC_OR_DEPARTMENT_OTHER): Payer: Medicare Other | Attending: Internal Medicine | Admitting: Internal Medicine

## 2020-10-06 ENCOUNTER — Other Ambulatory Visit: Payer: Self-pay

## 2020-10-06 DIAGNOSIS — Y842 Radiological procedure and radiotherapy as the cause of abnormal reaction of the patient, or of later complication, without mention of misadventure at the time of the procedure: Secondary | ICD-10-CM | POA: Diagnosis not present

## 2020-10-06 DIAGNOSIS — Z8546 Personal history of malignant neoplasm of prostate: Secondary | ICD-10-CM | POA: Diagnosis not present

## 2020-10-06 DIAGNOSIS — N3041 Irradiation cystitis with hematuria: Secondary | ICD-10-CM | POA: Diagnosis present

## 2020-10-06 LAB — GLUCOSE, CAPILLARY
Glucose-Capillary: 164 mg/dL — ABNORMAL HIGH (ref 70–99)
Glucose-Capillary: 116 mg/dL — ABNORMAL HIGH (ref 70–99)

## 2020-10-10 ENCOUNTER — Other Ambulatory Visit: Payer: Self-pay

## 2020-10-10 ENCOUNTER — Encounter (HOSPITAL_BASED_OUTPATIENT_CLINIC_OR_DEPARTMENT_OTHER): Payer: Medicare Other | Admitting: Internal Medicine

## 2020-10-10 DIAGNOSIS — N3041 Irradiation cystitis with hematuria: Secondary | ICD-10-CM

## 2020-10-10 LAB — GLUCOSE, CAPILLARY
Glucose-Capillary: 145 mg/dL — ABNORMAL HIGH (ref 70–99)
Glucose-Capillary: 151 mg/dL — ABNORMAL HIGH (ref 70–99)

## 2020-10-10 NOTE — Progress Notes (Signed)
NORTON, BIVINS ([id number]) Visit Report for 10/05/2020 HBO Details Patient Name: Date of Service: RICE, WALSH 10/05/2020 8:00 A M Medical Record Number: [id number] Patient Account Number: [id number] Date of Birth/Sex: Treating RN: [birth year] (70 y.o. 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 HBO Treatment Details Treatment Number: 34 Patient Type: Outpatient Chamber Type: Monoplace Chamber Serial #: [id number] Treatment Protocol: 2.5 ATA with 90 minutes oxygen, with two 5 minute air breaks Treatment Details Compression Rate Down: 2.0 psi / minute De-Compression Rate Up: 2.5 psi / minute A breaks and breathing ir Compress Tx Pressure periods Decompress Decompress Begins Reached (leave unused spaces Begins Ends blank) Chamber Pressure (ATA 1 2.5 2.5 2.5 2.5 2.5 - - 2.5 1 ) Clock Time (24 hr) 08:00 08:12 08:42 08:47 09:17 09:22 - - 09:52 10:02 Treatment Length: 122 (minutes) Treatment Segments: 4 Vital Signs Capillary Blood Glucose Reference Range: 80 - 120 mg / dl HBO Diabetic Blood Glucose Intervention Range: <131 mg/dl or >249 mg/dl Time Vitals Blood Respiratory Capillary Blood Glucose Pulse Action Type: Pulse: Temperature: Taken: Pressure: Rate: Glucose (mg/dl): Meter #: Oximetry (%) Taken: Pre 07:45 144/61 67 18 98.4 173 Post 10:02 157/65 56 18 97.6 145 Treatment Response Treatment Completion Status: Treatment Completed without Adverse Event Hamdi Vari Notes Wound exam; the patient tolerated treatment well. No complaints or ill effects Physician HBO Attestation: I certify that I supervised this HBO treatment in  accordance with Medicare guidelines. A trained emergency response team is readily available per Yes hospital policies and procedures. Continue HBOT as ordered. Yes Electronic Signature(s) Signed: 10/05/2020 5:19:12 PM By: Linton Ham MD Entered By: Linton Ham on 10/05/2020 12:37:26 -------------------------------------------------------------------------------- HBO Safety Checklist Details Patient Name: Date of Service: [name], [place] 10/05/2020 8:00 A M Medical Record Number: [id number] Patient Account Number: [id number] Date of Birth/Sex: Treating RN: 05/30/1950 (70 y.o. [name] Primary Care Roper Tolson: [name], [place] Other Clinician: Referring Anant Agard: Treating Luceal Hollibaugh/Extender: [name], [name] Weeks in Treatment: 8 HBO Safety Checklist Items Safety Checklist Consent Form Signed Patient voided / foley secured and emptied When did you last eato 0630 Last dose of injectable or oral agent na Ostomy pouch emptied and vented if applicable NA All implantable devices assessed, documented and approved NA Intravenous access site secured and place NA Valuables secured Linens and cotton and cotton/polyester blend (less than 51% polyester) Personal oil-based products / skin lotions / body lotions removed Wigs or hairpieces removed Smoking or tobacco materials removed Books / newspapers / magazines / loose paper removed Cologne, aftershave, perfume and deodorant removed Jewelry removed (may wrap wedding band) Make-up removed Hair care products removed Battery operated devices (external) removed Heating patches and chemical warmers removed Titanium eyewear removed Nail polish cured greater than 10 hours NA Casting material cured greater than 10 hours NA Hearing aids removed NA Loose dentures or partials removed NA Prosthetics have been removed Patient demonstrates correct use of air break device (if applicable) Patient concerns have been  addressed Patient grounding bracelet on and cord attached to chamber Specifics for Inpatients (complete in addition to above) Medication sheet sent with patient NA Intravenous medications needed or due during therapy sent with patient NA Drainage tubes (e.g. nasogastric tube  or chest tube secured and vented) NA Endotracheal or Tracheotomy tube secured NA Cuff deflated of air and inflated with saline NA Airway suctioned Electronic Signature(s) Signed: 10/10/2020 7:10:28 PM By: Levan Hurst RN, BSN Entered By: Levan Hurst on 10/05/2020 10:31:33

## 2020-10-10 NOTE — Progress Notes (Signed)
[name], [name] ([id number]) Visit Report for 10/06/2020 Arrival Information Details Patient Name: Date of Service: [name], [name] 10/06/2020 8:00 A M Medical Record Number: [id number] Patient Account Number: [id number] Date of Birth/Sex: Treating RN: May 15, 1950 (70 y.o. [name] Primary Care Chord Takahashi: [name], [place] Other Clinician: Referring [name]: Treating [name]/Extender: [name], [name] Weeks in Treatment: 8 Visit Information History Since Last Visit Added or deleted any medications: No Patient Arrived: [place] Any new allergies or adverse reactions: No Arrival Time: 07:37 Had a fall or experienced change in No Accompanied By: alone activities of daily living that may affect Transfer Assistance: None risk of falls: Patient Identification Verified: Yes Signs or symptoms of abuse/neglect since last visito No Secondary Verification Process Completed: Yes Hospitalized since last visit: No Patient Requires Transmission-Based Precautions: No Implantable device outside of the clinic excluding No Patient Has Alerts: Yes cellular tissue based products placed in the center Patient Alerts: BP Right Arm Only since last visit: Pain Present Now: No Electronic Signature(s) Signed: 10/10/2020 7:10:28 PM By: Jon Hurst RN, BSN Entered By: [name] on 10/06/2020 11:10:01 -------------------------------------------------------------------------------- Encounter Discharge Information Details Patient Name: Date of Service: [name], [name] 10/06/2020 8:00 A M Medical Record Number: [id number] Patient Account Number: [id number] Date of Birth/Sex: Treating RN: 1950-06-27 (70 y.o. [name] Primary Care Kayelyn Lemon: [name], [place] Other Clinician: Referring [name]: Treating [name]/Extender: [name], [name] Weeks in Treatment: 8 Encounter Discharge Information Items Discharge Condition: Stable Ambulatory Status: Walker Discharge  Destination: Home Transportation: Private Auto Accompanied By: alone Schedule Follow-up Appointment: Yes Clinical Summary of Care: Patient Declined Electronic Signature(s) Signed: 10/10/2020 7:10:28 PM By: Jon Hurst RN, BSN Entered By: [name] on 10/06/2020 11:12:36 -------------------------------------------------------------------------------- Patient/Caregiver Education Details Patient Name: Date of Service: [name], [name] 7/1/2022andnbsp8:00 A M Medical Record Number: [id number] Patient Account Number: [id number] Date of Birth/Gender: Treating RN: April 08, 1951 (70 y.o. [name] Primary Care Physician: [name], [place] TA Agency Other Clinician: Referring Physician: Treating Physician/Extender: [name], [name] Weeks in Treatment: 8 Education Assessment Education Provided To: Patient Education Topics Provided Wound/Skin Impairment: Methods: Explain/Verbal Responses: State content correctly Electronic Signature(s) Signed: 10/10/2020 7:10:28 PM By: Jon Hurst RN, BSN Entered By: [name] on 10/06/2020 11:12:21 -------------------------------------------------------------------------------- [place] Details Patient Name: Date of Service: [name], [name] 10/06/2020 8:00 A M Medical Record Number: [id number] Patient Account Number: [id number] Date of Birth/Sex: Treating RN: 07-21-1950 (70 y.o. [name] Primary Care [name]: [name], [place] Other Clinician: Referring Zen Felling: Treating [name]/Extender: [name], [name] Weeks in Treatment: 8 Vital Signs Time Taken: 07:37 Temperature (F): 98.0 Height (in): 70 Pulse (bpm): 72 Weight (lbs): 195 Respiratory Rate (breaths/min): 16 Body Mass Index (BMI): 28 Blood Pressure (mmHg): 125/50 Capillary Blood Glucose (mg/dl): 164 Reference Range: 80 - 120 mg / dl Electronic Signature(s) Signed: 10/10/2020 7:10:28 PM By: Jon Hurst RN, BSN Entered By: [name] on  10/06/2020 11:10:19

## 2020-10-10 NOTE — Progress Notes (Signed)
NEKO, BOYAJIAN ([id number]) Visit Report for 10/05/2020 Arrival Information Details Patient Name: Date of Service: BENNET, KUJAWA 10/05/2020 8:00 A M Medical Record Number: [id number] Patient Account Number: [id number] Date of Birth/Sex: Treating RN: 11-Aug-1950 (70 y.o. [name] Primary Care Niveah Boerner: [name], [place] Other Clinician: Referring Navreet Bolda: Treating Zeina Akkerman/Extender: [name], [name] Weeks in Treatment: 8 Visit Information History Since Last Visit Added or deleted any medications: No Patient Arrived: [name] Any new allergies or adverse reactions: No Arrival Time: 07:45 Had a fall or experienced change in No Accompanied By: alone activities of daily living that may affect Transfer Assistance: None risk of falls: Patient Identification Verified: Yes Signs or symptoms of abuse/neglect since last visito No Secondary Verification Process Completed: Yes Hospitalized since last visit: No Patient Requires Transmission-Based Precautions: No Implantable device outside of the clinic excluding No Patient Has Alerts: Yes cellular tissue based products placed in the center Patient Alerts: BP Right Arm Only since last visit: Pain Present Now: No Electronic Signature(s) Signed: 10/10/2020 7:10:28 PM By: Levan Hurst RN, BSN Entered By: Levan Hurst on 10/05/2020 10:30:37 -------------------------------------------------------------------------------- Encounter Discharge Information Details Patient Name: Date of Service: [name], [place] 10/05/2020 8:00 A M Medical Record Number: [id number] Patient Account Number: [id number] Date of Birth/Sex: Treating RN: 1950-12-29 (70 y.o. [name] Primary Care Rickelle Sylvestre: [name], [place] Other Clinician: Referring Katelin Kutsch: Treating Kaena Santori/Extender: [name], [name] Weeks in Treatment: 8 Encounter Discharge Information Items Discharge Condition: Stable Ambulatory Status: Walker Discharge  Destination: Home Transportation: Private Auto Accompanied By: alone Schedule Follow-up Appointment: Yes Clinical Summary of Care: Patient Declined Electronic Signature(s) Signed: 10/10/2020 7:10:28 PM By: Levan Hurst RN, BSN Entered By: Levan Hurst on 10/05/2020 10:32:54 -------------------------------------------------------------------------------- Vitals Details Patient Name: Date of Service: [name], [place] 10/05/2020 8:00 A M Medical Record Number: [id number] Patient Account Number: [id number] Date of Birth/Sex: Treating RN: [birth year] (70 y.o. [name] Primary Care Mayling Aber: [name], [place] Other Clinician: Referring Alexandra Posadas: Treating Erna Brossard/Extender: [name], [name] Weeks in Treatment: 8 Vital Signs Time Taken: 07:45 Temperature (F): 98.4 Height (in): 70 Pulse (bpm): 67 Weight (lbs): 195 Respiratory Rate (breaths/min): 18 Body Mass Index (BMI): 28 Blood Pressure (mmHg): 144/61 Capillary Blood Glucose (mg/dl): 173 Reference Range: 80 - 120 mg / dl Electronic Signature(s) Signed: 10/10/2020 7:10:28 PM By: Levan Hurst RN, BSN Entered By: Levan Hurst on 10/05/2020 10:30:57

## 2020-10-10 NOTE — Progress Notes (Signed)
LAZLO, TUNNEY ([id number]) Visit Report for 10/05/2020 Super[place] Details Patient Name: Date of Service: SURESH, AUDI 10/05/2020 Medical Record Number: [id number] Patient Account Number: [id number] Date of Birth/Sex: Treating RN: Jun 18, 1950 (70 y.o. [name] Primary Care Provider: [name], [place] Other Clinician: Referring Provider: Treating Provider/Extender: [name], [name] Weeks in Treatment: 8 Diagnosis Coding ICD-10 Codes Code Description N30.41 Irradiation cystitis with hematuria Z85.46 Personal history of malignant neoplasm of prostate Facility Procedures CPT4 Code Description Modifier Quantity 81103159 G0277-(Facility Use Only) HBOT full body chamber, 55min , 4 Physician Procedures Quantity CPT4 Code Description Modifier [id number] 24462 - WC PHYS HYPERBARIC OXYGEN THERAPY 1 ICD-10 Diagnosis Description N30.41 Irradiation cystitis with hematuria Z85.46 Personal history of malignant neoplasm of prostate Electronic Signature(s) Signed: 10/05/2020 5:19:12 PM By: Linton Ham MD Signed: 10/10/2020 7:10:28 PM By: Levan Hurst RN, BSN Entered By: Levan Hurst on 10/05/2020 10:32:37

## 2020-10-10 NOTE — Progress Notes (Signed)
KAYRON, HICKLIN ([id number]) Visit Report for 10/06/2020 SuperBill Details Patient Name: Date of Service: [name], [name] 10/06/2020 Medical Record Number: [id number] Patient Account Number: [id number] Date of Birth/Sex: Treating RN: 02-12-1951 (70 y.o. [name] Primary Care Provider: [name], [place] Other Clinician: Referring Provider: Treating Provider/Extender: Levin Bacon, [name] Weeks in Treatment: 8 Diagnosis Coding ICD-10 Codes Code Description N30.41 Irradiation cystitis with hematuria Z85.46 Personal history of malignant neoplasm of prostate Facility Procedures CPT4 Code Description Modifier Quantity [id number] G0277-(Facility Use Only) HBOT full body chamber, 45min , 4 Physician Procedures Quantity CPT4 Code Description Modifier [id number] 78675 - WC PHYS HYPERBARIC OXYGEN THERAPY 1 ICD-10 Diagnosis Description N30.41 Irradiation cystitis with hematuria Z85.46 Personal history of malignant neoplasm of prostate Electronic Signature(s) Signed: 10/06/2020 12:52:40 PM By: Kalman Shan DO Signed: 10/10/2020 7:10:28 PM By: Levan Hurst RN, BSN Entered By: Levan Hurst on 10/06/2020 11:12:09

## 2020-10-10 NOTE — Progress Notes (Signed)
YANCARLOS, BERTHOLD ([id number]) Visit Report for 10/06/2020 HBO Details Patient Name: Date of Service: [name], [name] 10/06/2020 8:00 A M Medical Record Number: [id number] Patient Account Number: [id number] Date of Birth/Sex: Treating RN: [birth year] (70 y.o. 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 HBO Treatment Details Treatment Number: 35 Patient Type: Outpatient Chamber Type: Monoplace Chamber Serial #: [id number] Treatment Protocol: 2.5 ATA with 90 minutes oxygen, with two 5 minute air breaks Treatment Details Compression Rate Down: 2.0 psi / minute De-Compression Rate Up: 2.5 psi / minute A breaks and breathing ir Compress Tx Pressure periods Decompress Decompress Begins Reached (leave unused spaces Begins Ends blank) Chamber Pressure (ATA 1 2.5 2.5 2.5 2.5 2.5 - - 2.5 1 ) Clock Time (24 hr) 08:17 08:29 08:59 09:04 09:34 09:39 - - 10:09 10:19 Treatment Length: 122 (minutes) Treatment Segments: 4 Vital Signs Capillary Blood Glucose Reference Range: 80 - 120 mg / dl HBO Diabetic Blood Glucose Intervention Range: <131 mg/dl or >249 mg/dl Time Vitals Blood Respiratory Capillary Blood Glucose Pulse Action Type: Pulse: Temperature: Taken: Pressure: Rate: Glucose (mg/dl): Meter #: Oximetry (%) Taken: Pre 07:37 125/50 72 16 98 164 Post 10:19 164/69 65 18 97.5 116 Treatment Response Treatment Completion Status: Treatment Completed without Adverse Event Physician HBO Attestation: I certify that I supervised this HBO treatment in accordance with Medicare guidelines. A trained emergency response team is readily available per  Yes hospital policies and procedures. Continue HBOT as ordered. Yes Electronic Signature(s) Signed: 10/06/2020 12:52:40 PM By: Kalman Shan DO Entered By: Kalman Shan on 10/06/2020 12:40:55 -------------------------------------------------------------------------------- HBO Safety Checklist Details Patient Name: Date of Service: [name], [name] 10/06/2020 8:00 A M Medical Record Number: [id number] Patient Account Number: [id number] Date of Birth/Sex: Treating RN: 19-May-1950 (70 y.o. [name] Primary Care Audryna Wendt: [name], [place] Other Clinician: Referring Cloyce Paterson: Treating Kein Carlberg/Extender: Levin Bacon, [name] Weeks in Treatment: 8 HBO Safety Checklist Items Safety Checklist Consent Form Signed Patient voided / foley secured and emptied When did you last eato 0630 Last dose of injectable or oral agent na Ostomy pouch emptied and vented if applicable NA All implantable devices assessed, documented and approved NA Intravenous access site secured and place NA Valuables secured Linens and cotton and cotton/polyester blend (less than 51% polyester) Personal oil-based products / skin lotions / body lotions removed Wigs or hairpieces removed Smoking or tobacco materials removed Books / newspapers / magazines / loose paper removed Cologne, aftershave, perfume and deodorant removed Jewelry removed (may wrap wedding band) Make-up removed Hair care products removed Battery operated devices (external) removed Heating patches and chemical warmers removed Titanium eyewear removed Nail polish cured greater than 10 hours NA Casting material cured greater than 10 hours NA Hearing aids removed NA Loose dentures or partials removed NA Prosthetics have been removed Patient demonstrates correct use of air break device (if applicable) Patient concerns have been addressed Patient grounding bracelet on and cord attached to chamber Specifics for Inpatients  (complete in addition to above) Medication sheet sent with patient NA Intravenous medications needed or due during therapy sent with patient NA Drainage tubes (e.g. nasogastric tube or chest tube secured and vented) NA Endotracheal or Tracheotomy tube secured NA Cuff  deflated of air and inflated with saline NA Airway suctioned NA Electronic Signature(s) Signed: 10/10/2020 7:10:28 PM By: Levan Hurst RN, BSN Entered By: Levan Hurst on 10/06/2020 11:10:53

## 2020-10-10 NOTE — Progress Notes (Signed)
[name] ([id number]) Visit Report for 10/10/2020 Arrival Information Details Patient Name: Date of Service: [name] 10/10/2020 8:00 A M Medical Record Number: [id number] Patient Account Number: [id number] Date of Birth/Sex: Treating RN: Aug 07, 1950 (70 y.o. [name] Primary Care Valleri Hendricksen: [name], [place] Other Clinician: Referring Aerabella Galasso: Treating Christophr Calix/Extender: Levin Bacon, [name] Weeks in Treatment: 9 Visit Information History Since Last Visit Added or deleted any medications: No Patient Arrived: [place] Any new allergies or adverse reactions: No Arrival Time: 07:46 Had a fall or experienced change in No Accompanied By: alone activities of daily living that may affect Transfer Assistance: None risk of falls: Patient Identification Verified: Yes Signs or symptoms of abuse/neglect since last visito No Secondary Verification Process Completed: Yes Hospitalized since last visit: No Patient Requires Transmission-Based Precautions: No Implantable device outside of the clinic excluding No Patient Has Alerts: Yes cellular tissue based products placed in the center Patient Alerts: BP Right Arm Only since last visit: Pain Present Now: No Electronic Signature(s) Signed: 10/10/2020 7:10:28 PM By: Levan Hurst RN, BSN Entered By: Levan Hurst on 10/10/2020 08:33:52 -------------------------------------------------------------------------------- [place] Details Patient Name: Date of Service: [name], [name] 10/10/2020 8:00 A M Medical Record Number: [id number] Patient Account Number: [id number] Date of Birth/Sex: Treating RN: 1950/12/19 (70 y.o. [name] Primary Care Aubrianne Molyneux: [name], [place] Other Clinician: Referring Aixa Corsello: Treating Kimisha Eunice/Extender: Levin Bacon, [name] Weeks in Treatment: 9 Vital Signs Time Taken: 07:46 Temperature (F): 98.0 Height (in): 70 Pulse (bpm): 70 Weight (lbs): 195 Respiratory Rate  (breaths/min): 18 Body Mass Index (BMI): 28 Blood Pressure (mmHg): 139/62 Capillary Blood Glucose (mg/dl): 145 Reference Range: 80 - 120 mg / dl Electronic Signature(s) Signed: 10/10/2020 7:10:28 PM By: Levan Hurst RN, BSN Entered By: Levan Hurst on 10/10/2020 08:34:17

## 2020-10-11 ENCOUNTER — Encounter (HOSPITAL_BASED_OUTPATIENT_CLINIC_OR_DEPARTMENT_OTHER): Payer: Medicare Other | Admitting: Physician Assistant

## 2020-10-11 DIAGNOSIS — N3041 Irradiation cystitis with hematuria: Secondary | ICD-10-CM | POA: Diagnosis not present

## 2020-10-11 LAB — GLUCOSE, CAPILLARY
Glucose-Capillary: 175 mg/dL — ABNORMAL HIGH (ref 70–99)
Glucose-Capillary: 118 mg/dL — ABNORMAL HIGH (ref 70–99)

## 2020-10-11 NOTE — Progress Notes (Signed)
Jon, [name] ([id number]) Visit Report for 10/03/2020 HBO Details Patient Name: Date of Service: [name], [name] 10/03/2020 8:00 A M Medical Record Number: [id number] Patient Account Number: [id number] Date of Birth/Sex: Treating RN: 02/12/1951 (70 y.o. [name] Primary Care Tyanna Hach: [name], [place] TA LIE Other Clinician: Referring Joyia Riehle: Treating Brandell Maready/Extender: Levin Bacon, [name] Weeks in Treatment: 8 HBO Treatment Course Details Treatment Course Number: 1 Ordering Joletta Manner: Linton Ham T Treatments Ordered: otal 40 HBO Treatment Start Date: 08/14/2020 HBO Indication: Late Effect of Radiation Notes: Radiation Cystitis HBO Treatment Details Treatment Number: 32 Patient Type: Outpatient Chamber Type: Monoplace Chamber Serial #: [id number] Treatment Protocol: 2.5 ATA with 90 minutes oxygen, with two 5 minute air breaks Treatment Details Compression Rate Down: 2.0 psi / minute De-Compression Rate Up: 2.5 psi / minute A breaks and breathing ir Compress Tx Pressure periods Decompress Decompress Begins Reached (leave unused spaces Begins Ends blank) Chamber Pressure (ATA 1 2.5 2.5 2.5 2.5 2.5 - - 2.5 1 ) Clock Time (24 hr) 08:12 08:24 08:54 08:59 09:29 09:34 - - 10:04 10:14 Treatment Length: 122 (minutes) Treatment Segments: 4 Vital Signs Capillary Blood Glucose Reference Range: 80 - 120 mg / dl HBO Diabetic Blood Glucose Intervention Range: <131 mg/dl or >249 mg/dl Time Vitals Blood Respiratory Capillary Blood Glucose Pulse Action Type: Pulse: Temperature: Taken: Pressure: Rate: Glucose (mg/dl): Meter #: Oximetry (%) Taken: Pre 07:40 153/60 66 18 98.1 193 Post 10:14 168/67 65 18 98 103 Treatment Response Treatment Completion Status: Treatment Completed without Adverse Event Quantina Dershem Notes No concerns with treatment given Physician HBO Attestation: I certify that I supervised this HBO treatment in accordance with Medicare guidelines. A trained  emergency response team is readily available per Yes hospital policies and procedures. Continue HBOT as ordered. Yes Electronic Signature(s) Signed: 10/05/2020 9:24:00 PM By: Kalman Shan DO Signed: 10/11/2020 5:13:41 PM By: Linton Ham MD Previous Signature: 10/03/2020 3:44:13 PM Version By: Kalman Shan DO Previous Signature: 10/03/2020 3:16:41 PM Version By: Kalman Shan DO Entered By: Linton Ham on 10/03/2020 17:08:02 -------------------------------------------------------------------------------- HBO Safety Checklist Details Patient Name: Date of Service: [name], [place] 10/03/2020 8:00 A M Medical Record Number: [id number] Patient Account Number: [id number] Date of Birth/Sex: Treating RN: 07-01-1950 (70 y.o. [name] Primary Care Finesse Fielder: [name], [place] Other Clinician: Referring Helios Kohlmann: Treating Anushka Hartinger/Extender: Levin Bacon, [name] Weeks in Treatment: 8 HBO Safety Checklist Items Safety Checklist Consent Form Signed Patient voided / foley secured and emptied When did you last eato 0630 Last dose of injectable or oral agent na Ostomy pouch emptied and vented if applicable NA All implantable devices assessed, documented and approved NA Intravenous access site secured and place NA Valuables secured Linens and cotton and cotton/polyester blend (less than 51% polyester) Personal oil-based products / skin lotions / body lotions removed Wigs or hairpieces removed Smoking or tobacco materials removed Books / newspapers / magazines / loose paper removed Cologne, aftershave, perfume and deodorant removed Jewelry removed (may wrap wedding band) Make-up removed Hair care products removed Battery operated devices (external) removed Heating patches and chemical warmers removed Titanium eyewear removed Nail polish cured greater than 10 hours Casting material cured greater than 10 hours NA Hearing aids removed NA Loose dentures or  partials removed NA Prosthetics have been removed Patient demonstrates correct use of air break device (if applicable) Patient concerns have been addressed Patient grounding bracelet on and cord attached to chamber Specifics for Inpatients (complete in addition to above) Medication sheet  sent with patient NA Intravenous medications needed or due during therapy sent with patient NA Drainage tubes (e.g. nasogastric tube or chest tube secured and vented) NA Endotracheal or Tracheotomy tube secured NA Cuff deflated of air and inflated with saline NA Airway suctioned NA Electronic Signature(s) Signed: 10/03/2020 5:39:04 PM By: Levan Hurst RN, BSN Entered By: Levan Hurst on 10/03/2020 11:12:36

## 2020-10-11 NOTE — Progress Notes (Signed)
LORRIS, CARDUCCI ([id number]) Visit Report for 10/11/2020 Arrival Information Details Patient Name: Date of Service: JUSTICE, MILLIRON 10/11/2020 8:00 A M Medical Record Number: [id number] Patient Account Number: [id number] Date of Birth/Sex: Treating RN: 01/30/1951 (70 y.o. [name], [name] Primary Care Leviticus Harton: [name], [place] Other Clinician: Referring Ramonda Galyon: Treating Maci Eickholt/Extender: Doyle Askew, [name] Weeks in Treatment: 9 Visit Information History Since Last Visit Added or deleted any medications: No Patient Arrived: Walker Any new allergies or adverse reactions: No Arrival Time: 07:43 Had a fall or experienced change in No Accompanied By: self activities of daily living that may affect Transfer Assistance: None risk of falls: Patient Identification Verified: Yes Signs or symptoms of abuse/neglect since last visito No Secondary Verification Process Completed: Yes Hospitalized since last visit: No Patient Requires Transmission-Based Precautions: No Implantable device outside of the clinic excluding No Patient Has Alerts: Yes cellular tissue based products placed in the center Patient Alerts: BP Right Arm Only since last visit: Pain Present Now: No Electronic Signature(s) Signed: 10/11/2020 6:50:03 PM By: Deon Pilling Entered By: Deon Pilling on 10/11/2020 18:01:26 -------------------------------------------------------------------------------- Encounter Discharge Information Details Patient Name: Date of Service: [name], [place] 10/11/2020 8:00 A M Medical Record Number: [id number] Patient Account Number: [id number] Date of Birth/Sex: Treating RN: 1951-03-10 (70 y.o. [name] Primary Care Braylin Xu: [name], [place] Other Clinician: Referring Hina Gupta: Treating Kmya Placide/Extender: Doyle Askew, [name] Weeks in Treatment: 9 Encounter Discharge Information Items Discharge Condition: Stable Ambulatory Status: Walker Discharge Destination:  Home Transportation: Private Auto Accompanied By: self Schedule Follow-up Appointment: Yes Clinical Summary of Care: Electronic Signature(s) Signed: 10/11/2020 6:50:03 PM By: Deon Pilling Entered By: Deon Pilling on 10/11/2020 18:03:36 -------------------------------------------------------------------------------- Vitals Details Patient Name: Date of Service: [name], [place] 10/11/2020 8:00 A M Medical Record Number: [id number] Patient Account Number: [id number] Date of Birth/Sex: Treating RN: [birth year] (70 y.o. [name] Primary Care Rhydian Baldi: [name], [place] Other Clinician: Referring Cy Bresee: Treating Kairi Tufo/Extender: Doyle Askew, [name] Weeks in Treatment: 9 Vital Signs Time Taken: 07:43 Temperature (F): 98.2 Height (in): 70 Pulse (bpm): 71 Weight (lbs): 195 Respiratory Rate (breaths/min): 20 Body Mass Index (BMI): 28 Blood Pressure (mmHg): 129/53 Capillary Blood Glucose (mg/dl): 175 Reference Range: 80 - 120 mg / dl Electronic Signature(s) Signed: 10/11/2020 6:50:03 PM By: Deon Pilling Entered By: Deon Pilling on 10/11/2020 18:01:49

## 2020-10-12 ENCOUNTER — Encounter (HOSPITAL_BASED_OUTPATIENT_CLINIC_OR_DEPARTMENT_OTHER): Payer: Medicare Other | Admitting: Internal Medicine

## 2020-10-12 ENCOUNTER — Ambulatory Visit (INDEPENDENT_AMBULATORY_CARE_PROVIDER_SITE_OTHER): Payer: Medicare Other | Admitting: Orthopedic Surgery

## 2020-10-12 ENCOUNTER — Other Ambulatory Visit: Payer: Self-pay

## 2020-10-12 DIAGNOSIS — L97411 Non-pressure chronic ulcer of right heel and midfoot limited to breakdown of skin: Secondary | ICD-10-CM

## 2020-10-12 DIAGNOSIS — L97421 Non-pressure chronic ulcer of left heel and midfoot limited to breakdown of skin: Secondary | ICD-10-CM | POA: Diagnosis not present

## 2020-10-12 DIAGNOSIS — I2510 Atherosclerotic heart disease of native coronary artery without angina pectoris: Secondary | ICD-10-CM

## 2020-10-12 DIAGNOSIS — N3041 Irradiation cystitis with hematuria: Secondary | ICD-10-CM | POA: Diagnosis not present

## 2020-10-12 LAB — GLUCOSE, CAPILLARY
Glucose-Capillary: 167 mg/dL — ABNORMAL HIGH (ref 70–99)
Glucose-Capillary: 101 mg/dL — ABNORMAL HIGH (ref 70–99)

## 2020-10-13 ENCOUNTER — Encounter (HOSPITAL_BASED_OUTPATIENT_CLINIC_OR_DEPARTMENT_OTHER): Payer: Medicare Other | Admitting: Internal Medicine

## 2020-10-13 ENCOUNTER — Other Ambulatory Visit: Payer: Self-pay

## 2020-10-13 DIAGNOSIS — N3041 Irradiation cystitis with hematuria: Secondary | ICD-10-CM | POA: Diagnosis not present

## 2020-10-13 LAB — GLUCOSE, CAPILLARY
Glucose-Capillary: 125 mg/dL — ABNORMAL HIGH (ref 70–99)
Glucose-Capillary: 152 mg/dL — ABNORMAL HIGH (ref 70–99)
Glucose-Capillary: 107 mg/dL — ABNORMAL HIGH (ref 70–99)

## 2020-10-13 NOTE — Progress Notes (Signed)
PORFIRIO, BOLLIER ([id number]) Visit Report for 10/10/2020 SuperBill Details Patient Name: Date of Service: [name], [name] 10/10/2020 Medical Record Number: [id number] Patient Account Number: [id number] Date of Birth/Sex: Treating RN: Jul 28, 1950 (70 y.o. [name] Primary Care Provider: [name], [place] Other Clinician: Referring Provider: Treating Provider/Extender: Levin Bacon, [name] Weeks in Treatment: 9 Diagnosis Coding ICD-10 Codes Code Description N30.41 Irradiation cystitis with hematuria Z85.46 Personal history of malignant neoplasm of prostate Facility Procedures CPT4 Code Description Modifier Quantity 82883374 G0277-(Facility Use Only) HBOT full body chamber, 58min , 4 Physician Procedures Quantity CPT4 Code Description Modifier [id number] - WC PHYS HYPERBARIC OXYGEN THERAPY 1 ICD-10 Diagnosis Description N30.41 Irradiation cystitis with hematuria Z85.46 Personal history of malignant neoplasm of prostate Electronic Signature(s) Signed: 10/10/2020 7:10:28 PM By: Levan Hurst RN, BSN Signed: 10/13/2020 4:56:18 PM By: Kalman Shan DO Entered By: Levan Hurst on 10/10/2020 17:46:46

## 2020-10-13 NOTE — Progress Notes (Signed)
[name], [name] ([id number]) Visit Report for 10/13/2020 Arrival Information Details Patient Name: Date of Service: [name] 10/13/2020 8:00 A M Medical Record Number: [id number] Patient Account Number: [id number] Date of Birth/Sex: Treating RN: Nov 12, 1950 (70 y.o. [name] Primary Care Taos Tapp: [name], [place] Other Clinician: Referring [name]: Treating Jon Schmuhl/Extender: [name]: 9 Visit Information History Since Last Visit Added or deleted any medications: No Patient Arrived: [name] Any new allergies or adverse reactions: No Arrival Time: 07:42 Had a fall or experienced change in No Accompanied By: self activities of daily living that may affect Transfer Assistance: None risk of falls: Patient Identification Verified: Yes Signs or symptoms of abuse/neglect since last visito No Secondary Verification Process Completed: Yes Hospitalized since last visit: No Patient Requires Transmission-Based Precautions: No Implantable device outside of the clinic excluding No Patient Has Alerts: Yes cellular tissue based products placed in the center Patient Alerts: BP Right Arm Only since last visit: Pain Present Now: No Electronic Signature(s) Signed: 10/13/2020 5:32:25 PM By: [name] Entered By: [name] on 10/13/2020 09:30:40 -------------------------------------------------------------------------------- Encounter Discharge Information Details Patient Name: Date of Service: [name], [name] 10/13/2020 8:00 [name] Record Number: [id number] Patient Account Number: [id number] Date of Birth/Sex: Treating RN: Jan 01, 1951 (70 y.o. [name] Primary Care [name]: [name], [place] Other Clinician: Referring Jarel Cuadra: Treating Analayah Brooke/Extender: [name]: 9 Encounter Discharge Information Items Discharge Condition: Stable Ambulatory Status: [name] Discharge Destination:  Home Transportation: Private Auto Accompanied By: self Schedule Follow-up Appointment: Yes Clinical Summary of Care: Electronic Signature(s) Signed: 10/13/2020 5:32:25 PM By: [name] Entered By: [name] on 10/13/2020 17:17:17 -------------------------------------------------------------------------------- Patient/Caregiver Education Details Patient Name: Date of Service: [name], [name] 7/8/2022andnbsp8:00 A M Medical Record Number: [id number] Patient Account Number: [id number] Date of Birth/Gender: Treating RN: 07-09-1950 (70 y.o. [name] Primary Care Physician: [name], [place] TA LIE Other Clinician: Referring Physician: Treating Physician/Extender: [name]: 9 Education Assessment Education Provided To: Patient Education Topics Provided Nutrition: Handouts: Nutrition Methods: Explain/Verbal Responses: Reinforcements needed Electronic Signature(s) Signed: 10/13/2020 5:32:25 PM By: [name] Entered By: [name] on 10/13/2020 17:17:04 -------------------------------------------------------------------------------- Vitals Details Patient Name: Date of Service: [name], [name] 10/13/2020 8:00 A M Medical Record Number: [id number] Patient Account Number: [id number] Date of Birth/Sex: Treating RN: September 28, 1950 (70 y.o. [name] Primary Care Natavia Sublette: [name], [place] Other Clinician: Referring Emeka Lindner: Treating Mack Thurmon/Extender: [name]: 9 Vital Signs Time Taken: 07:42 Temperature (F): 98.1 Height (in): 70 Pulse (bpm): 72 Weight (lbs): 195 Respiratory Rate (breaths/min): 20 Body Mass Index (BMI): 28 Blood Pressure (mmHg): 132/51 Capillary Blood Glucose (mg/dl): 152 Reference Range: 80 - 120 mg / dl Electronic Signature(s) Signed: 10/13/2020 5:32:25 PM By: [name] Entered By: [name] on 10/13/2020 17:13:56

## 2020-10-13 NOTE — Progress Notes (Signed)
BROUGHTON, EPPINGER ([id number]) Visit Report for 10/10/2020 HBO Details Patient Name: Date of Service: [name], [name] 10/10/2020 8:00 A M Medical Record Number: [id number] Patient Account Number: [id number] Date of Birth/Sex: Treating RN: 1950-08-28 (70 y.o. [name] Primary Care Brynley Cuddeback: [name], [place] TA LIE Other Clinician: Referring Breuna Loveall: Treating Chinara Hertzberg/Extender: Levin Bacon, [name] Weeks in Treatment: 9 HBO Treatment Course Details Treatment Course Number: 1 Ordering Caylan Schifano: Linton Ham T Treatments Ordered: otal 40 HBO Treatment Start Date: 08/14/2020 HBO Indication: Late Effect of Radiation Notes: Radiation Cystitis HBO Treatment Details Treatment Number: 36 Patient Type: Outpatient Chamber Type: Monoplace Chamber Serial #: [id number] Treatment Protocol: 2.5 ATA with 90 minutes oxygen, with two 5 minute air breaks Treatment Details Compression Rate Down: 2.0 psi / minute De-Compression Rate Up: 2.0 psi / minute A breaks and breathing ir Compress Tx Pressure periods Decompress Decompress Begins Reached (leave unused spaces Begins Ends blank) Chamber Pressure (ATA 1 2.5 2.5 2.5 2.5 2.5 - - 2.5 1 ) Clock Time (24 hr) 08:13 08:25 08:55 09:00 09:30 09:35 - - 10:05 10:15 Treatment Length: 122 (minutes) Treatment Segments: 4 Vital Signs Capillary Blood Glucose Reference Range: 80 - 120 mg / dl HBO Diabetic Blood Glucose Intervention Range: <131 mg/dl or >249 mg/dl Time Vitals Blood Respiratory Capillary Blood Glucose Pulse Action Type: Pulse: Temperature: Taken: Pressure: Rate: Glucose (mg/dl): Meter #: Oximetry (%) Taken: Pre 07:46 139/62 70 18 98 145 Post 10:15 168/70 64 16 97.6 151 Treatment Response Treatment Completion Status: Treatment Completed without Adverse Event Physician HBO Attestation: I certify that I supervised this HBO treatment in accordance with Medicare guidelines. A trained emergency response team is readily available per  Yes hospital policies and procedures. Continue HBOT as ordered. Yes Electronic Signature(s) Signed: 10/13/2020 4:56:18 PM By: Kalman Shan DO Previous Signature: 10/10/2020 7:10:28 PM Version By: Levan Hurst RN, BSN Entered By: Kalman Shan on 10/13/2020 16:54:19 -------------------------------------------------------------------------------- HBO Safety Checklist Details Patient Name: Date of Service: [name], [name] 10/10/2020 8:00 A M Medical Record Number: [id number] Patient Account Number: [id number] Date of Birth/Sex: Treating RN: [birth year] (70 y.o. [name] Primary Care Morayma Godown: [name], [place] Other Clinician: Referring Brewster Wolters: Treating Michaelpaul Apo/Extender: Levin Bacon, [name] Weeks in Treatment: 9 HBO Safety Checklist Items Safety Checklist Consent Form Signed Patient voided / foley secured and emptied When did you last eato 0630 Last dose of injectable or oral agent na Ostomy pouch emptied and vented if applicable NA All implantable devices assessed, documented and approved NA Intravenous access site secured and place NA Valuables secured Linens and cotton and cotton/polyester blend (less than 51% polyester) Personal oil-based products / skin lotions / body lotions removed Wigs or hairpieces removed Smoking or tobacco materials removed Books / newspapers / magazines / loose paper removed Cologne, aftershave, perfume and deodorant removed Jewelry removed (may wrap wedding band) Make-up removed NA Hair care products removed Battery operated devices (external) removed Heating patches and chemical warmers removed Titanium eyewear removed Nail polish cured greater than 10 hours NA Casting material cured greater than 10 hours Hearing aids removed NA Loose dentures or partials removed NA Prosthetics have been removed Patient demonstrates correct use of air break device (if applicable) Patient concerns have been addressed Patient  grounding bracelet on and cord attached to chamber Specifics for Inpatients (complete in addition to above) Medication sheet sent with patient NA Intravenous medications needed or due during therapy sent with patient NA Drainage tubes (e.g. nasogastric tube or chest tube  secured and vented) NA Endotracheal or Tracheotomy tube secured NA Cuff deflated of air and inflated with saline NA Airway suctioned NA Electronic Signature(s) Signed: 10/10/2020 7:10:28 PM By: Levan Hurst RN, BSN Entered By: Levan Hurst on 10/10/2020 08:36:50

## 2020-10-15 NOTE — Progress Notes (Signed)
[name], [name] ([id number]) Visit Report for 10/13/2020 HBO Details Patient Name: Date of Service: [name], [name] 10/13/2020 8:00 A M Medical Record Number: [id number] Patient Account Number: [id number] Date of Birth/Sex: Treating RN: 08/12/1950 (70 y.o. [name] Primary Care Demmi Sindt: [name], [place] TA LIE Other Clinician: Referring Taunja Brickner: Treating Khyli Swaim/Extender: Levin Bacon, [name] Weeks in Treatment: 9 HBO Treatment Course Details Treatment Course Number: 1 Ordering Kileigh Ortmann: Linton Ham T Treatments Ordered: otal 40 HBO Treatment Start 08/14/2020 Date: HBO Indication: Late Effect of Radiation HBO Treatment End 10/13/2020 Date: Notes: Radiation Cystitis HBO Discharge Treatment Series Complete; STRN/ORN Protocol Outcome: Goals Achieved HBO Treatment Details Treatment Number: 39 Patient Type: Outpatient Chamber Type: Monoplace Chamber Serial #: [id number] Treatment Protocol: 2.5 ATA with 90 minutes oxygen, with two 5 minute air breaks Treatment Details Compression Rate Down: 2.0 psi / minute De-Compression Rate Up: 2.5 psi / minute A breaks and breathing ir Compress Tx Pressure periods Decompress Decompress Begins Reached (leave unused spaces Begins Ends blank) Chamber Pressure (ATA 1 2.5 2.5 2.5 2.5 2.5 - - 2.5 1 ) Clock Time (24 hr) 08:09 08:22 08:52 08:57 09:27 09:32 - - 10:02 10:09 Treatment Length: 120 (minutes) Treatment Segments: 4 Vital Signs Capillary Blood Glucose Reference Range: 80 - 120 mg / dl HBO Diabetic Blood Glucose Intervention Range: <131 mg/dl or >249 mg/dl Time Vitals Blood Respiratory Capillary Blood Glucose Pulse Action Type: Pulse: Temperature: Taken: Pressure: Rate: Glucose (mg/dl): Meter #: Oximetry (%) Taken: Pre 07:42 132/51 72 20 98.1 152 Post 10:10 170/66 66 16 98 107 Treatment Response Treatment Toleration: Well Treatment Completion Status: Treatment Completed without Adverse Event Electronic Signature(s) Signed:  10/13/2020 5:32:25 PM By: Deon Pilling Signed: 10/15/2020 9:21:29 PM By: Kalman Shan DO Entered By: Deon Pilling on 10/13/2020 17:16:25 -------------------------------------------------------------------------------- HBO Safety Checklist Details Patient Name: Date of Service: [name], [place] 10/13/2020 8:00 A M Medical Record Number: [id number] Patient Account Number: [id number] Date of Birth/Sex: Treating RN: January 01, 1951 (70 y.o. [name] Primary Care Love Milbourne: Other Clinician: Matthew Folks Referring Haelyn Forgey: Treating Faithann Natal/Extender: Levin Bacon, [name] Weeks in Treatment: 9 HBO Safety Checklist Items Safety Checklist Consent Form Signed Patient voided / foley secured and emptied When did you last eato this morning jimmy dean breakfast sandwich Last dose of injectable or oral agent weekly on Fridays. Ostomy pouch emptied and vented if applicable NA All implantable devices assessed, documented and approved NA Intravenous access site secured and place NA Valuables secured Linens and cotton and cotton/polyester blend (less than 51% polyester) Personal oil-based products / skin lotions / body lotions removed NA Wigs or hairpieces removed NA Smoking or tobacco materials removed NA Books / newspapers / magazines / loose paper removed NA Cologne, aftershave, perfume and deodorant removed NA Jewelry removed (may wrap wedding band) Make-up removed NA Hair care products removed NA Battery operated devices (external) removed NA Heating patches and chemical warmers removed NA Titanium eyewear removed Nail polish cured greater than 10 hours NA Casting material cured greater than 10 hours NA Hearing aids removed NA Loose dentures or partials removed NA Prosthetics have been removed NA Patient demonstrates correct use of air break device (if applicable) Patient concerns have been addressed Patient grounding bracelet on and cord attached to  chamber Specifics for Inpatients (complete in addition to above) Medication sheet sent with patient Intravenous medications needed or due during therapy sent with patient Drainage tubes (e.g. nasogastric tube or chest tube secured and vented) Endotracheal or Tracheotomy  tube secured Cuff deflated of air and inflated with saline Airway suctioned Electronic Signature(s) Signed: 10/13/2020 5:32:25 PM By: Deon Pilling Entered By: Deon Pilling on 10/13/2020 17:14:56

## 2020-10-15 NOTE — Progress Notes (Signed)
CHUE, BERKOVICH ([id number]) Visit Report for 10/13/2020 SuperBill Details Patient Name: Date of Service: [name], [name] 10/13/2020 Medical Record Number: [id number] Patient Account Number: [id number] Date of Birth/Sex: Treating RN: Jun 20, 1950 (70 y.o. [name] Primary Care Provider: [name], [place] Other Clinician: Referring Provider: Treating Provider/Extender: Levin Bacon, [name] Weeks in Treatment: 9 Diagnosis Coding ICD-10 Codes Code Description N30.41 Irradiation cystitis with hematuria Z85.46 Personal history of malignant neoplasm of prostate Facility Procedures CPT4 Code Description Modifier Quantity [id number] G0277-(Facility Use Only) HBOT full body chamber, 28min , 4 Physician Procedures Quantity CPT4 Code Description Modifier [id number] - WC PHYS HYPERBARIC OXYGEN THERAPY 1 ICD-10 Diagnosis Description N30.41 Irradiation cystitis with hematuria Electronic Signature(s) Signed: 10/13/2020 5:32:25 PM By: Deon Pilling Signed: 10/15/2020 9:21:29 PM By: Kalman Shan DO Entered By: Deon Pilling on 10/13/2020 17:16:32

## 2020-10-16 ENCOUNTER — Encounter (HOSPITAL_BASED_OUTPATIENT_CLINIC_OR_DEPARTMENT_OTHER): Payer: Medicare Other | Admitting: Internal Medicine

## 2020-10-17 ENCOUNTER — Encounter (HOSPITAL_BASED_OUTPATIENT_CLINIC_OR_DEPARTMENT_OTHER): Payer: Medicare Other | Admitting: Internal Medicine

## 2020-10-17 NOTE — Progress Notes (Signed)
Jon, [name] ([id number]) Visit Report for 10/12/2020 HBO Details Patient Name: Date of Service: [name], [name] 10/12/2020 8:00 A M Medical Record Number: [id number] Patient Account Number: [id number] Date of Birth/Sex: Treating RN: 1951-02-10 (70 y.o. [name] Primary Care [name]: [name], [place] Other Clinician: Referring Anselm Aumiller: Treating Alyssa Rotondo/Extender: [name], [name] Weeks in Treatment: 9 HBO Treatment Course Details Treatment Course Number: 1 Ordering Kortni Hasten: Linton Ham T Treatments Ordered: otal 40 HBO Treatment Start Date: 08/14/2020 HBO Indication: Late Effect of Radiation Notes: Radiation Cystitis HBO Treatment Details Treatment Number: 38 Patient Type: Outpatient Chamber Type: Monoplace Chamber Serial #: [id number] Treatment Protocol: 2.5 ATA with 90 minutes oxygen, with two 5 minute air breaks Treatment Details Compression Rate Down: 2.0 psi / minute De-Compression Rate Up: 2.5 psi / minute A breaks and breathing ir Compress Tx Pressure periods Decompress Decompress Begins Reached (leave unused spaces Begins Ends blank) Chamber Pressure (ATA 1 2.5 2.5 2.5 2.5 2.5 - - 2.5 1 ) Clock Time (24 hr) 07:59 08:11 08:41 08:46 09:16 09:21 - - 09:51 10:01 Treatment Length: 122 (minutes) Treatment Segments: 4 Vital Signs Capillary Blood Glucose Reference Range: 80 - 120 mg / dl HBO Diabetic Blood Glucose Intervention Range: <131 mg/dl or >249 mg/dl Time Vitals Blood Respiratory Capillary Blood Glucose Pulse Action Type: Pulse: Temperature: Taken: Pressure: Rate: Glucose (mg/dl): Meter #: Oximetry (%) Taken: Pre 07:45 143/57 68 18 97.9 167 Post 10:01 154/67 64 16 97.4 101 Treatment Response Treatment Completion Status: Treatment Completed without Adverse Event [name] Notes No concerns with treatment given. Last treatment tomorrow Physician HBO Attestation: I certify that I supervised this HBO treatment in accordance with  Medicare guidelines. A trained emergency response team is readily available per Yes hospital policies and procedures. Continue HBOT as ordered. Yes Electronic Signature(s) Signed: 10/12/2020 8:35:52 PM By: Linton Ham MD Entered By: Linton Ham on 10/12/2020 17:30:03 -------------------------------------------------------------------------------- HBO Safety Checklist Details Patient Name: Date of Service: [name], [name] 10/12/2020 8:00 A M Medical Record Number: [id number] Patient Account Number: [id number] Date of Birth/Sex: Treating RN: 12/19/1950 (70 y.o. [name] Primary Care [name]: [name], [place] Other Clinician: Referring Sumayah Bearse: Treating Izacc Demeyer/Extender: [name], [name] Weeks in Treatment: 9 HBO Safety Checklist Items Safety Checklist Consent Form Signed Patient voided / foley secured and emptied When did you last eato 0630 Last dose of injectable or oral agent na Ostomy pouch emptied and vented if applicable NA All implantable devices assessed, documented and approved NA Intravenous access site secured and place NA Valuables secured Linens and cotton and cotton/polyester blend (less than 51% polyester) Personal oil-based products / skin lotions / body lotions removed Wigs or hairpieces removed Smoking or tobacco materials removed Books / newspapers / magazines / loose paper removed Cologne, aftershave, perfume and deodorant removed Jewelry removed (may wrap wedding band) Make-up removed Hair care products removed Battery operated devices (external) removed Heating patches and chemical warmers removed Titanium eyewear removed Nail polish cured greater than 10 hours Casting material cured greater than 10 hours Hearing aids removed Loose dentures or partials removed NA Prosthetics have been removed Patient demonstrates correct use of air break device (if applicable) Patient concerns have been addressed Patient grounding  bracelet on and cord attached to chamber Specifics for Inpatients (complete in addition to above) Medication sheet sent with patient NA Intravenous medications needed or due during therapy sent with patient NA Drainage tubes (e.g. nasogastric tube or chest tube secured and vented) NA  Endotracheal or Tracheotomy tube secured NA Cuff deflated of air and inflated with saline NA Airway suctioned NA Electronic Signature(s) Signed: 10/17/2020 5:43:47 PM By: Levan Hurst RN, BSN Entered By: Levan Hurst on 10/12/2020 17:26:20

## 2020-10-17 NOTE — Progress Notes (Signed)
LONGINO, TREFZ ([id number]) Visit Report for 10/12/2020 Arrival Information Details Patient Name: Date of Service: JEN, EPPINGER 10/12/2020 8:00 A M Medical Record Number: [id number] Patient Account Number: [id number] Date of Birth/Sex: Treating RN: [birth year] (70 y.o. [name] Primary Care Betty Brooks: [name], [place] Other Clinician: Referring Jaqualyn Juday: Treating Maurilio Puryear/Extender: [name], [name] Weeks in Treatment: 9 Visit Information History Since Last Visit Added or deleted any medications: No Patient Arrived: [name] Any new allergies or adverse reactions: No Arrival Time: 07:45 Had a fall or experienced change in No Accompanied By: alone activities of daily living that may affect Transfer Assistance: None risk of falls: Patient Identification Verified: Yes Signs or symptoms of abuse/neglect since last visito No Secondary Verification Process Completed: Yes Hospitalized since last visit: No Patient Requires Transmission-Based Precautions: No Implantable device outside of the clinic excluding No Patient Has Alerts: Yes cellular tissue based products placed in the center Patient Alerts: BP Right Arm Only since last visit: Pain Present Now: No Electronic Signature(s) Signed: 10/17/2020 5:43:47 PM By: Levan Hurst RN, BSN Entered By: Levan Hurst on 10/12/2020 17:24:52 -------------------------------------------------------------------------------- [place] Details Patient Name: Date of Service: [name], [place] 10/12/2020 8:00 [place] Record Number: [id number] Patient Account Number: [id number] Date of Birth/Sex: Treating RN: November 10, 1950 (70 y.o. [name] Primary Care Elfa Wooton: [name], [place] Other Clinician: Referring Eila Runyan: Treating Jizelle Conkey/Extender: [name], [name] Weeks in Treatment: 9 Vital Signs Time Taken: 07:45 Temperature (F): 97.9 Height (in): 70 Pulse (bpm): 68 Weight (lbs): 195 Respiratory Rate (breaths/min):  18 Body Mass Index (BMI): 28 Blood Pressure (mmHg): 143/57 Capillary Blood Glucose (mg/dl): 167 Reference Range: 80 - 120 mg / dl Electronic Signature(s) Signed: 10/17/2020 5:43:47 PM By: Levan Hurst RN, BSN Entered By: Levan Hurst on 10/12/2020 17:25:36

## 2020-10-17 NOTE — Progress Notes (Signed)
[name], [name] ([id number]) Visit Report for 10/12/2020 [facility] Details Patient Name: Date of Service: [name], [name] 10/12/2020 Medical Record Number: [id number] Patient Account Number: [id number] Date of Birth/Sex: Treating RN: 06/09/1950 (70 y.o. [name] Primary Care Provider: [name], [place] Other Clinician: Referring Provider: Treating Provider/Extender: [name], [name] Weeks in Treatment: 9 Diagnosis Coding ICD-10 Codes Code Description N30.41 Irradiation cystitis with hematuria Z85.46 Personal history of malignant neoplasm of prostate Facility Procedures CPT4 Code Description Modifier Quantity 65993570 G0277-(Facility Use Only) HBOT full body chamber, 14min , 4 Physician Procedures Quantity CPT4 Code Description Modifier [id number] 30092 - WC PHYS HYPERBARIC OXYGEN THERAPY 1 ICD-10 Diagnosis Description N30.41 Irradiation cystitis with hematuria Z85.46 Personal history of malignant neoplasm of prostate Electronic Signature(s) Signed: 10/12/2020 8:35:52 PM By: Linton Ham MD Signed: 10/17/2020 5:43:47 PM By: Levan Hurst RN, BSN Entered By: Levan Hurst on 10/12/2020 17:28:44

## 2020-10-18 ENCOUNTER — Telehealth: Payer: Self-pay | Admitting: Orthopedic Surgery

## 2020-10-18 DIAGNOSIS — Z89421 Acquired absence of other right toe(s): Secondary | ICD-10-CM

## 2020-10-18 NOTE — Telephone Encounter (Signed)
I called patient. He states that Dr. [name] was going to refer him to PT upstairs. He is walking with a walker and would like assistance with transitioning to cane. OK for order?

## 2020-10-18 NOTE — Addendum Note (Signed)
Addended by: Dondra Prader R on: 10/18/2020 02:58 PM   Modules accepted: Orders

## 2020-10-18 NOTE — Telephone Encounter (Signed)
Pt called about physical therapy referral.   CB [phone]

## 2020-10-24 ENCOUNTER — Encounter: Payer: Self-pay | Admitting: Orthopedic Surgery

## 2020-10-24 NOTE — Progress Notes (Signed)
Office Visit Note   Patient: [name]           Date of Birth: 07-Sep-1950           MRN: [id number] Visit Date: 10/12/2020              Requested by: Lorenda Hatchet, [place],  [place] [address] PCP: Lorenda Hatchet, FNP  Chief Complaint  Patient presents with   Right Foot - Follow-up   Left Foot - Follow-up      HPI: Patient is a 70 year old gentleman who presents evaluation for both feet.  Patient is on dialysis he  Complains of weakness and difficulty walking.  He has a double upright brace on the right and an AFO on the left.  Patient states that after his recent hospitalization he has been having problems with balance and weakness. Assessment & Plan: Visit Diagnoses:  1. Midfoot ulcer, right, limited to breakdown of skin ([place])   2. Midfoot ulcer, left, limited to breakdown of skin ([place])     Plan: Patient was given a prescription for therapy new braces and recommended compression socks for the venous insufficiency.  Nails were trimmed x6 calluses x2.  Follow-Up Instructions: Return in about 3 months (around 01/12/2021).   Ortho Exam  Patient is alert, oriented, no adenopathy, well-dressed, normal affect, normal respiratory effort. Examination of both feet patient has thickened discolored onychomycotic nails x6 these were trimmed without complications.  He has hypertrophic calluses on both feet from the bony prominences and the calluses were pared x2.  Patient has no redness no cellulitis no signs of infection.  Imaging: No results found. No images are attached to the encounter.  Labs: Lab Results  Component Value Date   HGBA1C 5.4 09/13/2020   HGBA1C 5.7 (H) 06/16/2020   HGBA1C 6.3 (H) 12/14/2018   ESRSEDRATE 34 (H) 01/24/2020   ESRSEDRATE 41 (H) 01/10/2020   ESRSEDRATE 55 (H) 12/27/2019   CRP 8.8 (H) 01/24/2020   CRP 35.9 (H) 01/10/2020   CRP 77.9 (H) 12/27/2019   REPTSTATUS 09/29/2020 FINAL 09/28/2020   GRAMSTAIN   01/07/2020    FEW WBC PRESENT, PREDOMINANTLY PMN NO ORGANISMS SEEN    CULT (A) 09/28/2020    <10,000 COLONIES/mL INSIGNIFICANT GROWTH Performed at [facility] [address] [place], [place] [address]    LABORGA STAPHYLOCOCCUS HAEMOLYTICUS 01/07/2020   LABORGA STENOTROPHOMONAS MALTOPHILIA 01/07/2020     Lab Results  Component Value Date   ALBUMIN 3.2 (L) 09/14/2020   ALBUMIN 3.9 09/13/2020   ALBUMIN 2.9 (L) 06/21/2020   PREALBUMIN 14.8 (L) 12/14/2018    Lab Results  Component Value Date   MG 2.0 06/28/2020   MG 1.8 06/27/2020   MG 1.9 06/24/2020   No results found for: [facility]  Lab Results  Component Value Date   PREALBUMIN 14.8 (L) 12/14/2018   CBC EXTENDED Latest Ref Rng & Units 09/28/2020 09/14/2020 09/13/2020  WBC 4.0 - 10.5 K/uL 11.0(H) 5.2 -  RBC 4.22 - 5.81 MIL/uL 3.73(L) 2.75(L) -  HGB 13.0 - 17.0 g/dL 10.3(L) 7.5(L) 7.1(L)  HCT 39.0 - 52.0 % 33.1(L) 24.1(L) 22.5(L)  PLT 150 - 400 K/uL 190 120(L) -  NEUTROABS 1.7 - 7.7 K/uL - - -  LYMPHSABS 0.7 - 4.0 K/uL - - -     There is no height or weight on file to calculate BMI.  Orders:  No orders of the defined types were placed in this encounter.  No orders of the defined types were placed in this encounter.    Procedures: No procedures performed  Clinical Data: No additional findings.  ROS:  All other systems negative, except as noted in the HPI. Review of Systems  Objective: Vital Signs: There were no vitals taken for this visit.  Specialty Comments:  No specialty comments available.  PMFS History: Patient Active Problem List   Diagnosis Date Noted   Symptomatic anemia 09/13/2020   COVID-19 virus infection 09/13/2020   Pressure injury of skin 06/29/2020   Gross hematuria 06/16/2020   Typical atrial flutter ([place]) 03/24/2020   Bilateral pleural effusion 02/24/2020   Healthcare maintenance 01/28/2020   Shortness of breath 01/28/2020   History of partial ray amputation of fourth toe of  right foot ([place]) 09/08/2019   Ulcerated, foot, right, with necrosis of bone ([place])    Chronic cough 06/25/2019   Cutaneous abscess of right foot    Subacute osteomyelitis of right foot ([place]) 12/14/2018   AKI (acute kidney injury) ([place]) 12/14/2018   ESRD (end stage renal disease) ([place]) 12/14/2018   Gait abnormality 08/24/2018   Diabetic neuropathy ([place]) 02/06/2018   Cervical myelopathy ([place]) 02/06/2018   Onychomycosis 10/30/2017   Other spondylosis with radiculopathy, cervical region 01/27/2017   Midfoot ulcer, right, limited to breakdown of skin ([place]) 11/15/2016   Lateral epicondylitis, left elbow 08/12/2016   Cellulitis of fifth toe of right foot 07/29/2016   Prostate cancer ([place]) 06/19/2016   Non-pressure chronic ulcer of other part of right foot limited to breakdown of skin ([place]) 04/04/2016   Cellulitis of leg, right 12/24/2015   Cellulitis of right lower extremity 12/24/2015   Gout 09/14/2012   HTN (hypertension) 09/14/2012   Hypothyroidism 09/14/2012   CAD (coronary artery disease) 09/14/2012   Diabetes mellitus ([place]) 09/14/2012   Hypercholesteremia    Neuropathy ([place])    Past Medical History:  Diagnosis Date   Ambulates with cane    straight cane   Cervical myelopathy ([place]) 02/06/2018   Chronic kidney disease    dailysis M [facility] F- home   Complication of anesthesia    Coronary artery disease    Diabetes mellitus without complication ([place])    type2   Diabetic foot ulcer ([employer])    Diabetic neuropathy ([place]) 02/06/2018   Gait abnormality 08/24/2018   GERD (gastroesophageal reflux disease)     01/06/20- not current   Gout    History of kidney stones    passed stones   Hypercholesteremia    Hypertension    Hypothyroidism    Neuromuscular disorder ([place])    neuropathy left leg and bilateral feet   Neuropathy    PONV (postoperative nausea and vomiting)    Prostate cancer ([place])    PVD (peripheral vascular disease) ([place])    with amputations    Family History  Problem  Relation Age of Onset   Diabetes Mellitus II Mother    Kidney disease Mother    Diabetes Mellitus II Father    CAD Father    Cancer Father        prostate   Kidney disease Father    Diabetes Mellitus II Brother    Kidney disease Brother    Diabetes Mellitus II Brother    Stomach cancer Brother 62   Kidney disease Brother    Colon cancer Neg Hx    Colon polyps Neg Hx    Esophageal cancer Neg Hx    Rectal cancer Neg Hx    Pancreatic cancer Neg  Hx     Past Surgical History:  Procedure Laterality Date   A-FLUTTER ABLATION N/A 04/06/2020   Procedure: A-FLUTTER ABLATION;  Surgeon: [name], MD;  Location: [facility];  Service: Cardiovascular;  Laterality: N/A;   AMPUTATION Left 12/25/2013   Procedure: AMPUTATION RAY LEFT 5TH RAY;  Surgeon: [name], MD;  Location: [facility];  Service: Orthopedics;  Laterality: Left;   AMPUTATION Right 12/15/2018   Procedure: AMPUTATION OF 4TH AND 5TH TOES RIGHT FOOT;  Surgeon: [name], MD;  Location: [facility];  Service: Orthopedics;  Laterality: Right;   APPLICATION OF WOUND VAC Right 12/15/2018   Procedure: APPLICATION OF WOUND VAC;  Surgeon: [name], MD;  Location: [facility];  Service: Orthopedics;  Laterality: Right;   AV FISTULA PLACEMENT Left 03/25/2019   Procedure: LEFT ARM ARTERIOVENOUS (AV) FISTULA CREATION;  Surgeon: [name], MD;  Location: [facility];  Service: Vascular;  Laterality: Left;   [place] Left 05/20/2019   Procedure: SECOND STAGE LEFT BASCILIC VEIN TRANSPOSITION;  Surgeon: [name], MD;  Location: [facility];  Service: Vascular;  Laterality: Left;   CARDIAC CATHETERIZATION  02/17/2014   CHOLECYSTECTOMY     COLONOSCOPY  2011   in [place], [place]  2008   CYSTOSCOPY N/A 06/29/2020   Procedure: [place];  Surgeon: [name], MD;  Location: [facility];  Service: Urology;  Laterality: N/A;    CYSTOSCOPY WITH FULGERATION Bilateral 06/17/2020   Procedure: CYSTOSCOPY,BILATERAL RETROGRADE, CLOT EVACUATION WITH FULGERATION OF THE BLADDER;  Surgeon: Janith Lima, MD;  Location: [facility];  Service: Urology;  Laterality: Bilateral;   CYSTOSCOPY WITH FULGERATION N/A 06/28/2020   Procedure: CYSTOSCOPY WITH CLOT EVACUATION AND FULGERATION OF BLEEDERS;  Surgeon: [name], MD;  Location: [facility];  Service: Urology;  Laterality: N/A;  1 HR   I & D EXTREMITY Right 12/15/2018   Procedure: DEBRIDEMENT RIGHT FOOT;  Surgeon: [name], MD;  Location: [facility];  Service: Orthopedics;  Laterality: Right;   I & D EXTREMITY Right 08/27/2019   Procedure: PARTIAL CUBOID EXCISION RIGHT FOOT;  Surgeon: [name], MD;  Location: [facility];  Service: Orthopedics;  Laterality: Right;   I & D EXTREMITY Right 01/07/2020   Procedure: RIGHT FOOT EXCISION INFECTED BONE;  Surgeon: [name], MD;  Location: [facility];  Service: Orthopedics;  Laterality: Right;   NECK SURGERY     novemver 2019   TRANSURETHRAL RESECTION OF PROSTATE N/A 06/28/2020   Procedure: TRANSURETHRAL RESECTION OF THE PROSTATE (TURP);  Surgeon: [name], MD;  Location: [facility];  Service: Urology;  Laterality: N/A;   WISDOM TOOTH EXTRACTION     Social History   Occupational History   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  Tobacco Use   Smoking status: Former    Types: Cigars    Start date: 04/08/1973    Quit date: 12/24/1988    Years since quitting: 31.8   Smokeless tobacco: Never   Tobacco comments:    Cigars and Pipe   Vaping Use   Vaping Use: Never used  Substance and Sexual Activity   Alcohol use: No   Drug use: No   Sexual activity: Yes    Partners: Female

## 2020-11-01 ENCOUNTER — Encounter: Payer: Self-pay | Admitting: Cardiology

## 2020-11-01 ENCOUNTER — Other Ambulatory Visit: Payer: Self-pay

## 2020-11-01 ENCOUNTER — Ambulatory Visit: Payer: Medicare Other | Admitting: Cardiology

## 2020-11-01 VITALS — BP 113/64 | HR 77 | Temp 98.1°F | Resp 17 | Ht 70.0 in | Wt 200.0 lb

## 2020-11-01 DIAGNOSIS — Z951 Presence of aortocoronary bypass graft: Secondary | ICD-10-CM

## 2020-11-01 DIAGNOSIS — I10 Essential (primary) hypertension: Secondary | ICD-10-CM

## 2020-11-01 DIAGNOSIS — I4819 Other persistent atrial fibrillation: Secondary | ICD-10-CM

## 2020-11-01 DIAGNOSIS — I2510 Atherosclerotic heart disease of native coronary artery without angina pectoris: Secondary | ICD-10-CM

## 2020-11-01 MED ORDER — CARVEDILOL 12.5 MG PO TABS: 12.5000 mg | ORAL_TABLET | Freq: Two times a day (BID) | ORAL | 3 refills | Status: DC

## 2020-11-01 NOTE — Progress Notes (Signed)
Primary Physician/Referring:  Lorenda Hatchet, FNP  Patient ID: [name], male    DOB: 15-Jun-1950, 70 y.o.   MRN: [id number]  Chief Complaint  Patient presents with   Follow-up   Atrial Fibrillation    HPI:    [name]  is a 70 y.o.  Caucasian male with CABG in [place] 02/18/2004: RIMA to LAD, LIMA to obtuse marginal, SVG to diagonal and SVG to PDA, diabetes mellitus, diabetic nephropathy with ESRD on HD at home, peripheral neuropathy and diabetic foot ulcer right, S/P Sim's amputation of the right foot and now has chronic ulcer, amputation of his left 2 toes due to diabetic foot ulcer in the past.  He still has a dry wound in his right leg but is healing well.  He underwent atrial flutter ablation on 04/06/2020, he has noticed his energy level is improving.  Patient was seen in the emergency room on 09/27/2020 for acute urinary retention and was found to be in A. fib and was started on Xarelto due to high cardioembolic risk, but then patient developed severe hematuria needing discontinuation of Xarelto.  He also underwent hyperbaric oxygen therapy to stop hematuria.  Finally he is feeling better, over the past 2 weeks he has not had any further hematuria.  Due to severe anemia, received iron transfusion.  Xarelto was discontinued. No chest pain, dyspnea stable, no leg edema.  He is presently feeling the best he has in quite a while.  Past Medical History:  Diagnosis Date   Ambulates with cane    straight cane   Cervical myelopathy ([place]) 02/06/2018   Chronic kidney disease    dailysis M W F- home   Complication of anesthesia    Coronary artery disease    Diabetes mellitus without complication ([place])    type2   Diabetic foot ulcer ([place])    Diabetic neuropathy ([place]) 02/06/2018   Gait abnormality 08/24/2018   GERD (gastroesophageal reflux disease)     01/06/20- not current   Gout    History of kidney stones    passed stones   Hypercholesteremia    Hypertension     Hypothyroidism    Neuromuscular disorder (HCC)    neuropathy left leg and bilateral feet   Neuropathy    PONV (postoperative nausea and vomiting)    Prostate cancer (HCC)    PVD (peripheral vascular disease) ([place])    with amputations   Past Surgical History:  Procedure Laterality Date   A-FLUTTER ABLATION N/A 04/06/2020   Procedure: A-FLUTTER ABLATION;  Surgeon: [name], MD;  Location: [facility];  Service: Cardiovascular;  Laterality: N/A;   AMPUTATION Left 12/25/2013   Procedure: AMPUTATION RAY LEFT 5TH RAY;  Surgeon: [name], MD;  Location: [facility];  Service: Orthopedics;  Laterality: Left;   AMPUTATION Right 12/15/2018   Procedure: AMPUTATION OF 4TH AND 5TH TOES RIGHT FOOT;  Surgeon: [name], MD;  Location: [facility];  Service: Orthopedics;  Laterality: Right;   APPLICATION OF WOUND VAC Right 12/15/2018   Procedure: APPLICATION OF WOUND VAC;  Surgeon: [name], MD;  Location: [facility];  Service: Orthopedics;  Laterality: Right;   AV FISTULA PLACEMENT Left 03/25/2019   Procedure: LEFT ARM ARTERIOVENOUS (AV) FISTULA CREATION;  Surgeon: [name], MD;  Location: [facility];  Service: Vascular;  Laterality: Left;   [place] Left 05/20/2019   Procedure: SECOND STAGE LEFT BASCILIC VEIN TRANSPOSITION;  Surgeon:  [name], MD;  Location: [facility];  Service: Vascular;  Laterality: Left;   CARDIAC CATHETERIZATION  02/17/2014   CHOLECYSTECTOMY     COLONOSCOPY  2011   in [place], [place] GRAFT  2008   CYSTOSCOPY N/A 06/29/2020   Procedure: CYSTOSCOPY WITH CLOT EVACUATION AND  FULGERATION;  Surgeon: [name], MD;  Location: [place];  Service: Urology;  Laterality: N/A;   CYSTOSCOPY WITH FULGERATION Bilateral 06/17/2020   Procedure: CYSTOSCOPY,BILATERAL RETROGRADE, CLOT EVACUATION WITH FULGERATION OF THE BLADDER;  Surgeon: Janith Lima, MD;  Location: [place];  Service: Urology;  Laterality:  Bilateral;   CYSTOSCOPY WITH FULGERATION N/A 06/28/2020   Procedure: CYSTOSCOPY WITH CLOT EVACUATION AND FULGERATION OF BLEEDERS;  Surgeon: [name], MD;  Location: [place];  Service: Urology;  Laterality: N/A;  1 HR   I & D EXTREMITY Right 12/15/2018   Procedure: DEBRIDEMENT RIGHT FOOT;  Surgeon: [name], MD;  Location: [place];  Service: Orthopedics;  Laterality: Right;   I & D EXTREMITY Right 08/27/2019   Procedure: PARTIAL CUBOID EXCISION RIGHT FOOT;  Surgeon: [name], MD;  Location: [place];  Service: Orthopedics;  Laterality: Right;   I & D EXTREMITY Right 01/07/2020   Procedure: RIGHT FOOT EXCISION INFECTED BONE;  Surgeon: [name], MD;  Location: [place];  Service: Orthopedics;  Laterality: Right;   NECK SURGERY     novemver 2019   TRANSURETHRAL RESECTION OF PROSTATE N/A 06/28/2020   Procedure: TRANSURETHRAL RESECTION OF THE PROSTATE (TURP);  Surgeon: [name], MD;  Location: [facility];  Service: Urology;  Laterality: N/A;   WISDOM TOOTH EXTRACTION     Social History   Tobacco Use   Smoking status: Former    Types: Cigars    Start date: 04/08/1973    Quit date: 12/24/1988    Years since quitting: 31.8   Smokeless tobacco: Never   Tobacco comments:    Cigars and Pipe   Substance Use Topics   Alcohol use: No    ROS  Review of Systems  Constitutional: Positive for malaise/fatigue. Negative for weight gain and weight loss.  Cardiovascular:  Negative for dyspnea on exertion, leg swelling and syncope.  Respiratory:  Negative for hemoptysis.   Endocrine: Negative for cold intolerance.  Hematologic/Lymphatic: Does not bruise/bleed easily.  Skin:        Right foot ulcer (not examined) and right shin skin tear after a recent trauma.  Gastrointestinal:  Negative for hematochezia and melena.  Neurological:  Positive for paresthesias. Negative for headaches and light-headedness.  Objective  Blood pressure 113/64, pulse 77, temperature 98.1 F (36.7 C),  temperature source Temporal, resp. rate 17, height 5\' 10"  (1.778 m), weight 200 lb (90.7 kg), SpO2 96 %.  Vitals with BMI 11/01/2020 09/28/2020 09/28/2020  Height 5\' 10"  - -  Weight 200 lbs - -  BMI 65.7 - -  Systolic 846 962 99  Diastolic 64 70 56  Pulse 77 75 69     Physical Exam Constitutional:      Comments: Ill looking  HENT:     Head: Atraumatic.  Eyes:     Conjunctiva/sclera: Conjunctivae normal.  Neck:     Thyroid: No thyromegaly.     Vascular: No carotid bruit or JVD.  Cardiovascular:     Rate and Rhythm: Normal rate and regular rhythm.     Pulses:          Carotid pulses are 2+ on  the right side and 2+ on the left side.      Femoral pulses are 2+ on the right side and 2+ on the left side.      Popliteal pulses are 2+ on the right side and 2+ on the left side.       Dorsalis pedis pulses are 1+ on the right side and 1+ on the left side.       Posterior tibial pulses are 1+ on the right side and 1+ on the left side.     Heart sounds: S1 normal and S2 normal. Murmur heard.  Holosystolic murmur is present with a grade of 2/6. Through out the precordium related to AV shunt left arm.      Comments: No open wounds. Thick rigid nails and onychomycosis (chronic).  Capillary refill < 3 Sec.  Left arm AV fistula for dialysis patent. Pulmonary:     Effort: Pulmonary effort is normal.     Breath sounds: Normal breath sounds.  Abdominal:     General: Bowel sounds are normal.     Palpations: Abdomen is soft.  Musculoskeletal:        General: No swelling. Normal range of motion.     Cervical back: Neck supple.  Skin:    General: Skin is warm and dry.     Capillary Refill: Capillary refill takes less than 2 seconds.  Neurological:     Mental Status: He is alert.   Laboratory examination:   Recent Labs    03/24/20 1203 05/04/20 1901 09/13/20 1155 09/14/20 0423 09/28/20 0034  NA 138   < > 139 137 136  K 3.9   < > 4.1 4.4 3.9  CL 95*   < > 98 100 96*  CO2 31*   < > 29 27  26   GLUCOSE 134*   < > 138* 136* 147*  BUN 27   < > 53* 62* 30*  CREATININE 4.20*   < > 5.25* 6.19* 3.89*  CALCIUM 9.2   < > 8.6* 8.4* 9.4  GFRNONAA 13*   < > 11* 9* 16*  GFRAA 15*  --   --   --   --    < > = values in this interval not displayed.   CrCl cannot be calculated (Patient's most recent lab result is older than the maximum 21 days allowed.).  CMP Latest Ref Rng & Units 09/28/2020 09/14/2020 09/13/2020  Glucose 70 - 99 mg/dL 147(H) 136(H) 138(H)  BUN 8 - 23 mg/dL 30(H) 62(H) 53(H)  Creatinine 0.61 - 1.24 mg/dL 3.89(H) 6.19(H) 5.25(H)  Sodium 135 - 145 mmol/L 136 137 139  Potassium 3.5 - 5.1 mmol/L 3.9 4.4 4.1  Chloride 98 - 111 mmol/L 96(L) 100 98  CO2 22 - 32 mmol/L 26 27 29   Calcium 8.9 - 10.3 mg/dL 9.4 8.4(L) 8.6(L)  Total Protein 6.5 - 8.1 g/dL - 5.8(L) 6.1(L)  Total Bilirubin 0.3 - 1.2 mg/dL - 0.9 0.6  Alkaline Phos 38 - 126 U/L - 85 91  AST 15 - 41 U/L - 13(L) 11(L)  ALT 0 - 44 U/L - 13 11   CBC Latest Ref Rng & Units 09/28/2020 09/14/2020 09/13/2020  WBC 4.0 - 10.5 K/uL 11.0(H) 5.2 -  Hemoglobin 13.0 - 17.0 g/dL 10.3(L) 7.5(L) 7.1(L)  Hematocrit 39.0 - 52.0 % 33.1(L) 24.1(L) 22.5(L)  Platelets 150 - 400 K/uL 190 120(L) -   HEMOGLOBIN A1C Lab Results  Component Value Date   HGBA1C 5.4 09/13/2020   MPG  108 09/13/2020   BNP (last 3 results) No results for input(s): BNP in the last 8760 hours.  ProBNP (last 3 results) No results for input(s): PROBNP in the last 8760 hours.  External labs:   Labs 08/25/2019:  Total cholesterol 109, triglycerides 331, HDL 25, LDL 43.  Non-HDL cholesterol 84.  Labs 02/25/2020: A1c 4.7%.  TSH normal.  Labs 02/17/2020:  Hb 10.4/HCT 31.5, platelets 146, normal indicis.  Sodium 139, potassium 4.5, BUN 46, creatinine 5.88.  Total bilirubin 0.69.  Albumin 4.2.  ALT 14.  AST 19.  Medications and allergies   Allergies  Allergen Reactions   Mushroom Extract Complex Nausea Only    Current Outpatient Medications on File Prior to  Visit  Medication Sig Dispense Refill   acetaminophen (TYLENOL) 500 MG tablet Take 1,000-2,000 mg by mouth 2 (two) times daily as needed for moderate pain.     allopurinol (ZYLOPRIM) 100 MG tablet Take 200 mg by mouth daily.      B Complex-C-Zn-Folic Acid (DIALYVITE 676 WITH ZINC) 0.8 MG TABS Take 1 tablet by mouth daily.     famotidine (PEPCID) 20 MG tablet TAKE 1 TABLET BY MOUTH AFTER SUPPER (Patient taking differently: as needed. TAKE 1 TABLET BY MOUTH AFTER SUPPER) 90 tablet 3   Lancets (ONETOUCH DELICA PLUS [id number]) MISC      levothyroxine (SYNTHROID, LEVOTHROID) 112 MCG tablet Take 112 mcg by mouth daily before breakfast. BRAND NAME SYNTHROID     losartan (COZAAR) 100 MG tablet Take 100 mg by mouth daily.     Polyethyl Glycol-Propyl Glycol (LUBRICANT EYE DROPS) 0.4-0.3 % SOLN Place 1-2 drops into both eyes 3 (three) times daily as needed (dry/irritated eyes.).     pregabalin (LYRICA) 150 MG capsule Take 1 capsule (150 mg total) by mouth in the morning and at bedtime. (Patient taking differently: Take 150 mg by mouth in the morning, at noon, and at bedtime.) 60 capsule 0   prochlorperazine (COMPAZINE) 5 MG tablet Take 1 tablet (5 mg total) by mouth 2 (two) times daily. 60 tablet 3   rosuvastatin (CRESTOR) 10 MG tablet Take 10 mg by mouth daily.     Semaglutide,0.25 or 0.5MG /DOS, (OZEMPIC, 0.25 OR 0.5 MG/DOSE,) 2 MG/1.5ML SOPN Inject 0.5 mg into the skin once a week.     Current Facility-Administered Medications on File Prior to Visit  Medication Dose Route Frequency Provider Last Rate Last Admin   0.9 %  sodium chloride infusion  500 mL Intravenous Continuous Irene Shipper, MD        Medications after today's encounter Current Outpatient Medications  Medication Instructions   acetaminophen (TYLENOL) 1,000-2,000 mg, Oral, 2 times daily PRN   allopurinol (ZYLOPRIM) 200 mg, Oral, Daily   B Complex-C-Zn-Folic Acid (DIALYVITE 283 WITH ZINC) 0.8 MG TABS 1 tablet, Oral, Daily   carvedilol  (COREG) 12.5 mg, Oral, 2 times daily   famotidine (PEPCID) 20 MG tablet TAKE 1 TABLET BY MOUTH AFTER SUPPER   Lancets (ONETOUCH DELICA PLUS [id number]) MISC No dose, route, or frequency recorded.   levothyroxine (SYNTHROID) 112 mcg, Oral, Daily before breakfast, BRAND NAME SYNTHROID   losartan (COZAAR) 100 mg, Oral, Daily   Ozempic (0.25 or 0.5 MG/DOSE) 0.5 mg, Subcutaneous, Weekly   Polyethyl Glycol-Propyl Glycol (LUBRICANT EYE DROPS) 0.4-0.3 % SOLN 1-2 drops, Both Eyes, 3 times daily PRN   pregabalin (LYRICA) 150 mg, Oral, 2 times daily   prochlorperazine (COMPAZINE) 5 mg, Oral, 2 times daily   rosuvastatin (CRESTOR) 10 mg, Oral, Daily  Radiology:   CXR 02/25/2020: Persistent small bilateral pleural effusions, slightly increased on the left.  Cardiac Studies:   Lower extremity arterial duplex 05/12/2014:  No hemodynamically significant stenoses are identified in the bilateral lower extremity arterial system.This exam reveals normal perfusion of both the lower extremities with RABI 1.04 and LABI 1.09. There is mild diffuse disease involving the small vessels below the knee. Compared to the study done on 06/08/2013, no significant change. Impression: Lower extremity venous insufficiency study 12/25/2016: No venous insufficiency or DVT in the right lower extremity. Reflexes in the left greater saphenous vein beginning in the distal thigh and involving the entire length of the calf. No obvious varicosities.  ABI 12/16/2018: Right: Resting right ankle-brachial index is within normal range. No evidence of significant right lower extremity arterial disease. Left: Resting left ankle-brachial index indicates noncompressible left lower extremity arteries.  Carotid artery duplex  05/07/2019: Stenosis in the bilateral internal carotid artery (16-49%), lower end of spectrum with heteregenous plaque.  Antegrade right vertebral artery flow. Antegrade left vertebral artery flow. Follow up in one year is  appropriate if clinically indicated. Compared to 01/27/2018, no significant change.  Echocardiogram 03/17/2020: Mildly depressed LV systolic function with visual EF 40-45%. Left ventricle cavity is normal in size. Moderate concentric hypertrophy of the left ventricle. Left ventricle regional wall motion findings: Mid inferoseptal, Apical septal and Apical cap hypokinesis. Unable to evaluate diastolic function due to atrial fibrillation. Elevated LAP.  Left atrial cavity is severely dilated. Aortic valve sclerosis without stenosis. Mild (Grade I) mitral regurgitation. Mild tricuspid regurgitation. No evidence of pulmonary hypertension. RVSP measures 33 mmHg. Mild pulmonic regurgitation. IVC is dilated with blunted respiratory response. Compared to prior study dated 06/08/2013: LVEF was 60-65% and now 40-45%, RWMA is new, LA is now severely dilated.   Lexiscan Tetrofosmin Stress Test 03/20/2020: Non-diagnostic ECG stress due to pharmacologic stress testing. Resting EKG demonstrated atrial flutter. Non-specific Twave abnormalities. Peak EKG revealed no significant ST-T change from baseline abnormality. Myocardial perfusion is normal. Mildly enlarged left ventricle. in both rest and stress. LV stress volume 170 ml. TID is normal. No stress lung uptake. Overall LV systolic function is low normal without regional wall motion  abnormalities. Stress LV EF: 51%. Compared to 05/28/2013, no significant change. LV dilatation is new.  EKG  EKG 11/01/2020: Atrial fibrillation with controlled ventricular sponsor rate of 65 bpm, normal axis.  Incomplete right bundle branch block.  Nonspecific T abnormality.    EKG 03/24/2020: Atrial flutter with 4: 1 conduction, ventricular rate 66 bpm.  Normal axis.  Nonspecific T abnormality.     EKG 05/03/2019: Sinus rhythm with borderline first-degree AV block, normal axis, incomplete right bundle branch block.  Poor R progression, probably normal variant.  No evidence of  ischemia.No significant change from EKG 05/18/2018   Assessment     ICD-10-CM   1. Coronary artery disease involving native coronary artery of native heart without angina pectoris  I25.10 carvedilol (COREG) 12.5 MG tablet    2. S/P CABG x 4:  02/18/2004: RIMA to LAD, LIMA to OM-1, SVG to D1 and SVG to PDA.  Z95.1     3. Essential hypertension  I10 EKG 12-Lead    carvedilol (COREG) 12.5 MG tablet    4. Persistent atrial fibrillation (HCC)  I48.19      CHA2DS2-VASc Score is 4.  Yearly risk of stroke: 4.8% (A, HTN, DM, Vasc Dz).  Score of 1=0.6; 2=2.2; 3=3.2; 4=4.8; 5=7.2; 6=9.8; 7=>9.8) -(CHF; HTN; vasc disease DM,  Male = 1; Age <65 =0; 65-74 = 1,  >75 =2; stroke/embolism= 2).    Meds ordered this encounter  Medications   carvedilol (COREG) 12.5 MG tablet    Sig: Take 1 tablet (12.5 mg total) by mouth in the morning and at bedtime.    Dispense:  180 tablet    Refill:  3     Medications Discontinued During This Encounter  Medication Reason   calcitRIOL (ROCALTROL) 0.5 MCG capsule Error   oxybutynin (DITROPAN) 5 MG tablet Error   pantoprazole (PROTONIX) 40 MG tablet Error   rosuvastatin (CRESTOR) 10 MG tablet Error   sevelamer carbonate (RENVELA) 800 MG tablet Error   carvedilol (COREG) 12.5 MG tablet Reorder     Recommendations:   Tirrell Buchberger  is a 70 y.o. Caucasian male with CABG in [place] 02/18/2004: RIMA to LAD, LIMA to obtuse marginal, SVG to diagonal and SVG to PDA, diabetes mellitus, diabetic nephropathy with ESRD on HD at home, peripheral neuropathy and diabetic foot ulcer right, S/P Sim's amputation of the right foot and now has chronic ulcer, amputation of his left 2 toes due to diabetic foot ulcer in the past.  He still has a dry wound in his right leg but is healing well.  He underwent atrial flutter ablation on 04/06/2020, he has noticed his energy level is improving. Patient was seen in the emergency room on 09/27/2020 for acute urinary retention and was found to  be in A. fib and was started on Xarelto due to high cardioembolic risk, but then patient developed severe hematuria needing discontinuation of Xarelto.  He also underwent hyperbaric oxygen therapy to stop hematuria.  Does not keep feeling the best he has in quite a while.  He has asymptomatic persistent atrial fibrillation.  We had a very long discussion with the patient and his wife regarding anticoagulation, bleeding risk and risk of stroke.  Patient does not wish to be back on anticoagulation as hematuria had become a major issue and he spent 6 weeks of therapy in hyperbaric oxygen chamber finally for the past 2 weeks he has had no further hematuria.  We also discussed regarding watchman device, patient prefers not to have any procedures done.  Blood pressure is well controlled, lipids are normal, diabetes is finally under control.  No clinical evidence of heart failure and has not had any angina pectoris.  He is receiving home hemodialysis and he has done well.  I will see him back in 6 months for follow-up.  I have canceled his scheduled echocardiogram.  This is a 40-minute office visit encounter.   Adrian Prows, MD, [facility] 11/01/2020, 12:06 PM Office: [phone] Pager: [phone]

## 2020-11-01 NOTE — Progress Notes (Signed)
HARRIET, BOLLEN ([id number]) Visit Report for 10/11/2020 SuperBill Details Patient Name: Date of Service: [name], [name] 10/11/2020 Medical Record Number: [id number] Patient Account Number: [id number] Date of Birth/Sex: Treating RN: Aug 22, 1950 (70 y.o. [name] Primary Care Provider: [name], [place] Other Clinician: Referring Provider: Treating Provider/Extender: Doyle Askew, [name] Weeks in Treatment: 9 Diagnosis Coding ICD-10 Codes Code Description N30.41 Irradiation cystitis with hematuria Z85.46 Personal history of malignant neoplasm of prostate Facility Procedures CPT4 Code Description Modifier Quantity [id number]-(Facility Use Only) HBOT full body chamber, 36min , 4 Physician Procedures Quantity CPT4 Code Description Modifier [id number] - WC PHYS HYPERBARIC OXYGEN THERAPY 1 ICD-10 Diagnosis Description N30.41 Irradiation cystitis with hematuria Electronic Signature(s) Signed: 10/11/2020 6:50:03 PM By: Deon Pilling Signed: 11/01/2020 8:49:57 AM By: Worthy Keeler PA-C Entered By: Deon Pilling on 10/11/2020 18:03:09

## 2020-11-01 NOTE — Progress Notes (Signed)
[name], [name] ([id number]) Visit Report for 10/11/2020 HBO Details Patient Name: Date of Service: [name], [name] 10/11/2020 8:00 A M Medical Record Number: [id number] Patient Account Number: [id number] Date of Birth/Sex: Treating RN: May 05, 1950 (70 y.o. [name] Primary Care [name]: [name], [place] [name] Other Clinician: Referring [name]: Treating [name]/Extender: [name], NA [name] Weeks in Treatment: 9 HBO Treatment Course Details Treatment Course Number: 1 Ordering [name]: [name] T Treatments Ordered: otal 40 HBO Treatment Start Date: 08/14/2020 HBO Indication: Late Effect of Radiation Notes: Radiation Cystitis HBO Treatment Details Treatment Number: 37 Patient Type: Outpatient Chamber Type: Monoplace Chamber Serial #: [name] Treatment Protocol: 2.5 ATA with 90 minutes oxygen, with two 5 minute air breaks Treatment Details Compression Rate Down: 2.0 psi / minute De-Compression Rate Up: 2.0 psi / minute A breaks and breathing ir Compress Tx Pressure periods Decompress Decompress Begins Reached (leave unused spaces Begins Ends blank) Chamber Pressure (ATA 1 2.5 2.5 2.5 2.5 2.5 - - 2.5 1 ) Clock Time (24 hr) 07:56 08:10 08:40 08:45 09:15 09:20 - - 09:50 10:00 Treatment Length: 124 (minutes) Treatment Segments: 4 Vital Signs Capillary Blood Glucose Reference Range: 80 - 120 mg / dl HBO Diabetic Blood Glucose Intervention Range: <131 mg/dl or >249 mg/dl Time Vitals Blood Respiratory Capillary Blood Glucose Pulse Action Type: Pulse: Temperature: Taken: Pressure: Rate: Glucose (mg/dl): Meter #: Oximetry (%) Taken: Pre 07:43 129/53 71 20 98.2 175 Post 10:05 156/72 65 18 97.7 118 Treatment Response Treatment Toleration: Well Treatment Completion Status: Treatment Completed without Adverse Event Electronic Signature(s) Signed: 10/11/2020 6:50:03 PM By: [name] Signed: 11/01/2020 8:49:57 AM By: Jon Keeler PA-C Entered By: [name] on  10/11/2020 18:03:04 -------------------------------------------------------------------------------- HBO Safety Checklist Details Patient Name: Date of Service: [name], [name] 10/11/2020 8:00 A M Medical Record Number: [id number] Patient Account Number: [id number] Date of Birth/Sex: Treating RN: [birth year] (70 y.o. [name] Primary Care [name]: [name], [place] Other Clinician: Referring Zimir Kittleson: Treating Wiletta Bermingham/Extender: [name], NA [name] Weeks in Treatment: 9 HBO Safety Checklist Items Safety Checklist Consent Form Signed Patient voided / foley secured and emptied When did you last eato this morning jimmy dean sandwich Last dose of injectable or oral agent weekly Friday Ostomy pouch emptied and vented if applicable NA All implantable devices assessed, documented and approved NA Intravenous access site secured and place NA Valuables secured Linens and cotton and cotton/polyester blend (less than 51% polyester) Personal oil-based products / skin lotions / body lotions removed NA Wigs or hairpieces removed NA Smoking or tobacco materials removed NA Books / newspapers / magazines / loose paper removed NA Cologne, aftershave, perfume and deodorant removed Jewelry removed (may wrap wedding band) Make-up removed NA Hair care products removed NA Battery operated devices (external) removed NA Heating patches and chemical warmers removed NA Titanium eyewear removed Nail polish cured greater than 10 hours NA Casting material cured greater than 10 hours NA Hearing aids removed NA Loose dentures or partials removed NA Prosthetics have been removed NA Patient demonstrates correct use of air break device (if applicable) Patient concerns have been addressed Patient grounding bracelet on and cord attached to chamber Specifics for Inpatients (complete in addition to above) Medication sheet sent with patient Intravenous medications needed or due  during therapy sent with patient Drainage tubes (e.g. nasogastric tube or chest tube secured and vented) Endotracheal or Tracheotomy tube secured Cuff deflated of air and inflated with saline Airway suctioned Electronic Signature(s) Signed:  10/11/2020 6:50:03 PM By: [name] Entered By: [name] on 10/11/2020 18:02:15

## 2020-11-03 ENCOUNTER — Ambulatory Visit (INDEPENDENT_AMBULATORY_CARE_PROVIDER_SITE_OTHER): Payer: Medicare Other | Admitting: Rehabilitative and Restorative Service Providers"

## 2020-11-03 ENCOUNTER — Encounter: Payer: Self-pay | Admitting: Rehabilitative and Restorative Service Providers"

## 2020-11-03 ENCOUNTER — Other Ambulatory Visit: Payer: Self-pay

## 2020-11-03 DIAGNOSIS — M79672 Pain in left foot: Secondary | ICD-10-CM | POA: Diagnosis not present

## 2020-11-03 DIAGNOSIS — M6281 Muscle weakness (generalized): Secondary | ICD-10-CM | POA: Diagnosis not present

## 2020-11-03 DIAGNOSIS — R262 Difficulty in walking, not elsewhere classified: Secondary | ICD-10-CM | POA: Diagnosis not present

## 2020-11-03 DIAGNOSIS — M79671 Pain in right foot: Secondary | ICD-10-CM

## 2020-11-03 DIAGNOSIS — R293 Abnormal posture: Secondary | ICD-10-CM

## 2020-11-03 NOTE — Therapy (Signed)
[facility] [address] [place], [place], [address] Phone: [phone]   Fax:  [phone]  Physical Therapy Evaluation  Patient Details  Name: [name] MRN: [id number] Date of Birth: 09/28/1950 Referring Provider (PT): Dondra Prader NP   Encounter Date: 11/03/2020   PT End of Session - 11/03/20 1102     Visit Number 1    Number of Visits 20    Date for PT Re-Evaluation 01/12/21    Authorization Type Medicare, BCBS    Progress Note Due on Visit 10    PT Start Time 1105    PT Stop Time 1145    PT Time Calculation (min) 40 min    Equipment Utilized During Treatment Gait belt    Activity Tolerance Patient tolerated treatment well    Behavior During Therapy [name] for tasks assessed/performed             Past Medical History:  Diagnosis Date   Ambulates with cane    straight cane   Cervical myelopathy ([place]) 02/06/2018   Chronic kidney disease    dailysis M W F- home   Complication of anesthesia    Coronary artery disease    Diabetes mellitus without complication ([place])    type2   Diabetic foot ulcer ([place])    Diabetic neuropathy ([place]) 02/06/2018   Gait abnormality 08/24/2018   GERD (gastroesophageal reflux disease)     01/06/20- not current   Gout    History of kidney stones    passed stones   Hypercholesteremia    Hypertension    Hypothyroidism    Neuromuscular disorder (HCC)    neuropathy left leg and bilateral feet   Neuropathy    PONV (postoperative nausea and vomiting)    Prostate cancer ([place])    PVD (peripheral vascular disease) ([place])    with amputations    Past Surgical History:  Procedure Laterality Date   A-FLUTTER ABLATION N/A 04/06/2020   Procedure: A-FLUTTER ABLATION;  Surgeon: [name], MD;  Location: [place] [facility];  Service: Cardiovascular;  Laterality: N/A;   AMPUTATION Left 12/25/2013   Procedure: AMPUTATION RAY LEFT 5TH RAY;  Surgeon: [name], MD;  Location: [facility];  Service: Orthopedics;   Laterality: Left;   AMPUTATION Right 12/15/2018   Procedure: AMPUTATION OF 4TH AND 5TH TOES RIGHT FOOT;  Surgeon: [name], MD;  Location: [place];  Service: Orthopedics;  Laterality: Right;   APPLICATION OF WOUND VAC Right 12/15/2018   Procedure: APPLICATION OF WOUND VAC;  Surgeon: [name], MD;  Location: [place];  Service: Orthopedics;  Laterality: Right;   AV FISTULA PLACEMENT Left 03/25/2019   Procedure: LEFT ARM ARTERIOVENOUS (AV) FISTULA CREATION;  Surgeon: [name], MD;  Location: [place] [facility];  Service: Vascular;  Laterality: Left;   [place] Left 05/20/2019   Procedure: SECOND STAGE LEFT BASCILIC VEIN TRANSPOSITION;  Surgeon: [name], MD;  Location: [place] [facility];  Service: Vascular;  Laterality: Left;   CARDIAC CATHETERIZATION  02/17/2014   CHOLECYSTECTOMY     COLONOSCOPY  2011   in [place], [place]  2008   CYSTOSCOPY N/A 06/29/2020   Procedure: [place];  Surgeon: [name], MD;  Location: [place];  Service: Urology;  Laterality: N/A;   CYSTOSCOPY WITH FULGERATION Bilateral 06/17/2020   Procedure: CYSTOSCOPY,BILATERAL RETROGRADE, CLOT EVACUATION WITH FULGERATION OF THE BLADDER;  Surgeon: Abner Greenspan,  Bonna Gains, MD;  Location: [place];  Service: Urology;  Laterality: Bilateral;   CYSTOSCOPY WITH FULGERATION N/A 06/28/2020   Procedure: CYSTOSCOPY WITH CLOT EVACUATION AND FULGERATION OF BLEEDERS;  Surgeon: [name], MD;  Location: [place];  Service: Urology;  Laterality: N/A;  1 HR   I & D EXTREMITY Right 12/15/2018   Procedure: DEBRIDEMENT RIGHT FOOT;  Surgeon: [name], MD;  Location: [place];  Service: Orthopedics;  Laterality: Right;   I & D EXTREMITY Right 08/27/2019   Procedure: PARTIAL CUBOID EXCISION RIGHT FOOT;  Surgeon: [name], MD;  Location: [place];  Service: Orthopedics;  Laterality: Right;   I & D EXTREMITY Right 01/07/2020   Procedure: RIGHT FOOT  EXCISION INFECTED BONE;  Surgeon: [name], MD;  Location: [place];  Service: Orthopedics;  Laterality: Right;   NECK SURGERY     novemver 2019   TRANSURETHRAL RESECTION OF PROSTATE N/A 06/28/2020   Procedure: TRANSURETHRAL RESECTION OF THE PROSTATE (TURP);  Surgeon: [name], MD;  Location: [place];  Service: Urology;  Laterality: N/A;   WISDOM TOOTH EXTRACTION      There were no vitals filed for this visit.    Subjective Assessment - 11/03/20 1102     Subjective Pt. indicated he wants to get off rollator and walk effectively with cane.  Pt. indicated having to switch to rollator due to fear of falling.  Pt. indicated onset about 6 months ago, (around surgery for foot).    Pertinent History Chronic kidney disease, CAD, diabetic neuropathy, GERD, Gout, HTN, hypothyroidism, history of prostate cancer, PVD    Dialysis, double upright brace on Rt and AFO on Lt    Limitations Standing;Walking;House hold activities    Patient Stated Goals Get to using cane    Currently in Pain? Yes    Pain Score 5     Pain Location Foot    Pain Orientation Left;Right   Rt > Lt   Pain Descriptors / Indicators Stabbing    Pain Type Chronic pain    Aggravating Factors  nighttime, some pain from braces at times,    Pain Relieving Factors medicine    Effect of Pain on Daily Activities Limited in walking, some limitations in workouts (bike riding, weight liting), limited in house helping                [facility] PT Assessment - 11/03/20 0001       Assessment   Medical Diagnosis Z89.421 (ICD-10-CM) - History of partial ray amputation of fourth toe of right foot ([place])    Referring Provider (PT) Dondra Prader NP    [place] residence    Living Arrangements Spouse/significant other;Children    Additional Comments Has stair lift for basement stairs.  Has 2nd story stairs but not to bedroom.  Has ramp for entry      Prior [place] for exercise, bike, household activity.      Observation/Other Assessments   Focus on Therapeutic Outcomes (FOTO)  intake 41, predicted 63 %      Posture/Postural Control   Posture/Postural Control Postural limitations    Postural Limitations Rounded Shoulders;Forward head;Increased lumbar lordosis;Flexed trunk      ROM / Strength   AROM / PROM / Strength Strength;PROM;AROM      AROM   Overall AROM Comments Check ankle mobility next visit (held due to time/device wearing)  AROM Assessment Site Knee;Ankle    Right/Left Knee Left;Right    Right Knee Extension 0    Left Knee Extension 0    Right/Left Ankle Left;Right      PROM   PROM Assessment Site Ankle    Right/Left Ankle Left;Right      Strength   Strength Assessment Site Ankle;Hip;Knee    Right/Left Hip Left;Right    Right Hip Flexion 4/5    Left Hip Flexion 4/5    Right/Left Knee Left;Right    Right Knee Flexion 3+/5    Right Knee Extension 5/5    Left Knee Flexion 3+/5    Left Knee Extension 5/5    Right/Left Ankle Left;Right    Right Ankle Dorsiflexion 4/5    Left Ankle Dorsiflexion 1/5      Ambulation/Gait   Ambulation/Gait Yes    Ambulation/Gait Assistance 6: Modified independent (Device/Increase time)    Ambulation Distance (Feet) 125 Feet    Assistive device 4-wheeled walker    Gait Pattern Decreased stride length;Decreased step length - left;Decreased step length - right;Trunk flexed;Wide base of support   brace devices on bilateral ankle/leg   Ambulation Surface Level    Gait velocity 2.0 ft/sec      Standardized Balance Assessment   Standardized Balance Assessment Berg Balance Test      Berg Balance Test   Sit to Stand Able to stand without using hands and stabilize independently    Standing Unsupported Able to stand 2 minutes with supervision    Sitting with Back Unsupported but Feet Supported on Floor or Stool Able to sit safely and securely 2 minutes    Stand to Sit Sits  independently, has uncontrolled descent    Transfers Able to transfer safely, definite need of hands    Standing Unsupported with Eyes Closed Able to stand 10 seconds with supervision    Standing Unsupported with Feet Together Needs help to attain position and unable to hold for 15 seconds    From Standing, Reach Forward with Outstretched Arm Reaches forward but needs supervision    From Standing Position, Pick up Object from Floor Unable to try/needs assist to keep balance    From Standing Position, Turn to Look Behind Over each Shoulder Needs assist to keep from losing balance and falling    Turn 360 Degrees Needs assistance while turning    Standing Unsupported, Alternately Place Feet on Step/Stool Needs assistance to keep from falling or unable to try    Standing Unsupported, One Foot in [employer] balance while stepping or standing    Standing on One Leg Unable to try or needs assist to prevent fall    Total Score 19                        Objective measurements completed on examination: See above findings.               PT Education - 11/03/20 1150     Education Details POC, current presentation status    Person(s) Educated Patient;Spouse    Methods Explanation    Comprehension Verbalized understanding              PT Short Term Goals - 11/03/20 1102       PT SHORT TERM GOAL #1   Title Patient will demonstrate independent use of home exercise program to maintain progress from in clinic treatments.    Time 3    Period Weeks  Status New    Target Date 11/24/20               PT Long Term Goals - 11/03/20 1102       PT LONG TERM GOAL #1   Title Patient will demonstrate/report pain at worst less than or equal to 2/10 to facilitate minimal limitation in daily activity secondary to pain symptoms.    Time 10    Period Weeks    Status New    Target Date 01/12/21      PT LONG TERM GOAL #2   Title Patient will demonstrate independent  use of home exercise program to facilitate ability to maintain/progress functional gains from skilled physical therapy services.    Time 10    Period Weeks    Status New    Target Date 01/12/21      PT LONG TERM GOAL #3   Title Pt. will demonstrate ambulation c quad cane c mod independence community distances > 300 ft at gait speed > 2.6 ft for community ambulation.    Time 10    Period Weeks    Status New    Target Date 01/12/21      PT LONG TERM GOAL #4   Title Pt. will demonstrate bilateral hip flexion, knee flexion strength 5/5 to facilitate progressive mobility.    Time 10    Period Weeks    Status New    Target Date 01/12/21      PT LONG TERM GOAL #5   Title Pt. will demonstrate FOTO outcome > or = 63 %    Time 10    Period Weeks    Status New    Target Date 01/12/21      Additional Long Term Goals   Additional Long Term Goals Yes      PT LONG TERM GOAL #6   Title Pt. will demonstrate BERG > 45 to indicate reduced fall risk due to condition.    Time 10    Period Weeks    Status New    Target Date 01/12/21                    Plan - 11/03/20 1103     Clinical Impression Statement Patient is a 70 y.o. who comes to clinic with complaints of bilateral foot pain (history of Rt foot ray amputations) with mobility, strength and movement coordination deficits that impair their ability to perform usual daily and recreational functional activities without increase difficulty/symptoms at this time.  History of ray amputation as noted.  Noted increased fall risk.  Patient to benefit from skilled PT services to address impairments and limitations to improve to previous level of function without restriction secondary to condition.    Personal Factors and Comorbidities Comorbidity 3+    Comorbidities Chronic kidney disease, CAD, diabetic neuropathy, GERD, Gout, HTN, hypothyroidism, history of prostate cancer, PVD    Dialysis, double upright brace on Rt and AFO on Lt     Stability/Clinical Decision Making Evolving/Moderate complexity    Clinical Decision Making Moderate    Rehab Potential --   fair to good   PT Frequency 2x / week    PT Duration Other (comment)   10 weeks   PT Treatment/Interventions ADLs/Self Care Home Management;Cryotherapy;Moist Heat;Balance training;Therapeutic exercise;Therapeutic activities;Functional mobility training;Stair training;Gait training;DME Instruction;Neuromuscular re-education;Patient/family education;Passive range of motion;Joint Manipulations;Splinting;Taping;Manual techniques;Orthotic Fit/Training    PT Next Visit Plan Aerobic intervention, check AROM for ankles, balance intervention, assessment of quad cane  if appropriate.    PT Home Exercise Plan Nothing from first visit.    Consulted and Agree with Plan of Care Patient;Family member/caregiver             Patient will benefit from skilled therapeutic intervention in order to improve the following deficits and impairments:  Abnormal gait, Decreased endurance, Hypomobility, Decreased strength, Decreased activity tolerance, Decreased balance, Decreased mobility, Difficulty walking, Impaired perceived functional ability, Improper body mechanics, Postural dysfunction, Impaired flexibility, Decreased coordination, Decreased range of motion  Visit Diagnosis: Pain in left foot  Pain in right foot  Muscle weakness (generalized)  Difficulty in walking, not elsewhere classified  Abnormal posture     Problem List Patient Active Problem List   Diagnosis Date Noted   Symptomatic anemia 09/13/2020   COVID-19 virus infection 09/13/2020   Pressure injury of skin 06/29/2020   Gross hematuria 06/16/2020   Typical atrial flutter ([place]) 03/24/2020   Bilateral pleural effusion 02/24/2020   Healthcare maintenance 01/28/2020   Shortness of breath 01/28/2020   History of partial ray amputation of fourth toe of right foot ([place]) 09/08/2019   Ulcerated, foot, right, with  necrosis of bone (HCC)    Chronic cough 06/25/2019   Cutaneous abscess of right foot    Subacute osteomyelitis of right foot ([place]) 12/14/2018   AKI (acute kidney injury) ([place]) 12/14/2018   ESRD (end stage renal disease) ([place]) 12/14/2018   Gait abnormality 08/24/2018   Diabetic neuropathy ([place]) 02/06/2018   Cervical myelopathy ([place]) 02/06/2018   Onychomycosis 10/30/2017   Other spondylosis with radiculopathy, cervical region 01/27/2017   Midfoot ulcer, right, limited to breakdown of skin ([place]) 11/15/2016   Lateral epicondylitis, left elbow 08/12/2016   Cellulitis of fifth toe of right foot 07/29/2016   Prostate cancer ([place]) 06/19/2016   Non-pressure chronic ulcer of other part of right foot limited to breakdown of skin ([place]) 04/04/2016   Cellulitis of leg, right 12/24/2015   Cellulitis of right lower extremity 12/24/2015   Gout 09/14/2012   HTN (hypertension) 09/14/2012   Hypothyroidism 09/14/2012   CAD (coronary artery disease) 09/14/2012   Diabetes mellitus ([place]) 09/14/2012   Hypercholesteremia    Neuropathy ([place])     Scot Jun, PT, DPT, OCS, ATC 11/03/20  11:54 AM    [facility] [address] [place], [place], [address] Phone: [phone]   Fax:  [phone]  Name: Danile Trier MRN: [id number] Date of Birth: May 27, 1950

## 2020-11-08 ENCOUNTER — Ambulatory Visit (INDEPENDENT_AMBULATORY_CARE_PROVIDER_SITE_OTHER): Payer: Medicare Other | Admitting: Physical Therapy

## 2020-11-08 ENCOUNTER — Other Ambulatory Visit: Payer: Self-pay

## 2020-11-08 DIAGNOSIS — R262 Difficulty in walking, not elsewhere classified: Secondary | ICD-10-CM

## 2020-11-08 DIAGNOSIS — M79671 Pain in right foot: Secondary | ICD-10-CM | POA: Diagnosis not present

## 2020-11-08 DIAGNOSIS — R293 Abnormal posture: Secondary | ICD-10-CM

## 2020-11-08 DIAGNOSIS — M79672 Pain in left foot: Secondary | ICD-10-CM | POA: Diagnosis not present

## 2020-11-08 DIAGNOSIS — M6281 Muscle weakness (generalized): Secondary | ICD-10-CM

## 2020-11-08 NOTE — Therapy (Signed)
[facility] Physical Therapy [address] [place], [place], [address] Phone: [phone]   Fax:  [phone]  Physical Therapy Treatment  Patient Details  Name: [name] MRN: [id number] Date of Birth: [birth year] Referring Provider (PT): Dondra Prader NP   Encounter Date: 11/08/2020   PT End of Session - 11/08/20 1403     Visit Number 2    Number of Visits 20    Date for PT Re-Evaluation 01/12/21    Authorization Type Medicare, BCBS    Progress Note Due on Visit 10    PT Start Time 1345    PT Stop Time 1430    PT Time Calculation (min) 45 min    Activity Tolerance Patient tolerated treatment well    Behavior During Therapy [facility] for tasks assessed/performed             Past Medical History:  Diagnosis Date   Ambulates with cane    straight cane   Cervical myelopathy ([place]) 02/06/2018   Chronic kidney disease    dailysis M W F- home   Complication of anesthesia    Coronary artery disease    Diabetes mellitus without complication ([place])    type2   Diabetic foot ulcer ([place])    Diabetic neuropathy ([place]) 02/06/2018   Gait abnormality 08/24/2018   GERD (gastroesophageal reflux disease)     01/06/20- not current   Gout    History of kidney stones    passed stones   Hypercholesteremia    Hypertension    Hypothyroidism    Neuromuscular disorder (HCC)    neuropathy left leg and bilateral feet   Neuropathy    PONV (postoperative nausea and vomiting)    Prostate cancer ([place])    PVD (peripheral vascular disease) ([place])    with amputations    Past Surgical History:  Procedure Laterality Date   A-FLUTTER ABLATION N/A 04/06/2020   Procedure: A-FLUTTER ABLATION;  Surgeon: [name], MD;  Location: [place] [facility];  Service: Cardiovascular;  Laterality: N/A;   AMPUTATION Left 12/25/2013   Procedure: AMPUTATION RAY LEFT 5TH RAY;  Surgeon: [name], MD;  [facility];  Service: Orthopedics;  Laterality: Left;   AMPUTATION Right 12/15/2018    Procedure: AMPUTATION OF 4TH AND 5TH TOES RIGHT FOOT;  Surgeon: [name], MD;  Location: [place];  Service: Orthopedics;  Laterality: Right;   APPLICATION OF WOUND VAC Right 12/15/2018   Procedure: APPLICATION OF WOUND VAC;  Surgeon: [name], MD;  Location: [place];  Service: Orthopedics;  Laterality: Right;   AV FISTULA PLACEMENT Left 03/25/2019   Procedure: LEFT ARM ARTERIOVENOUS (AV) FISTULA CREATION;  Surgeon: [name], MD;  Location: [place] [facility];  Service: Vascular;  Laterality: Left;   [place] Left 05/20/2019   Procedure: SECOND STAGE LEFT BASCILIC VEIN TRANSPOSITION;  Surgeon: [name], MD;  Location: [place] [facility];  Service: Vascular;  Laterality: Left;   CARDIAC CATHETERIZATION  02/17/2014   CHOLECYSTECTOMY     COLONOSCOPY  2011   in [place], [place]  2008   CYSTOSCOPY N/A 06/29/2020   Procedure: [place];  Surgeon: [name], MD;  Location: [place];  Service: Urology;  Laterality: N/A;   CYSTOSCOPY WITH FULGERATION Bilateral 06/17/2020   Procedure: CYSTOSCOPY,BILATERAL RETROGRADE, CLOT EVACUATION WITH FULGERATION OF THE BLADDER;  Surgeon: Janith Lima, MD;  Location: [place];  Service:  Urology;  Laterality: Bilateral;   CYSTOSCOPY WITH FULGERATION N/A 06/28/2020   Procedure: CYSTOSCOPY WITH CLOT EVACUATION AND FULGERATION OF BLEEDERS;  Surgeon: [name], MD;  Location: [place];  Service: Urology;  Laterality: N/A;  1 HR   I & D EXTREMITY Right 12/15/2018   Procedure: DEBRIDEMENT RIGHT FOOT;  Surgeon: [name], MD;  Location: [place];  Service: Orthopedics;  Laterality: Right;   I & D EXTREMITY Right 08/27/2019   Procedure: PARTIAL CUBOID EXCISION RIGHT FOOT;  Surgeon: [name], MD;  Location: [place];  Service: Orthopedics;  Laterality: Right;   I & D EXTREMITY Right 01/07/2020   Procedure: RIGHT FOOT EXCISION INFECTED BONE;  Surgeon: [name],  MD;  Location: [place];  Service: Orthopedics;  Laterality: Right;   NECK SURGERY     novemver 2019   TRANSURETHRAL RESECTION OF PROSTATE N/A 06/28/2020   Procedure: TRANSURETHRAL RESECTION OF THE PROSTATE (TURP);  Surgeon: [name], MD;  Location: [place];  Service: Urology;  Laterality: N/A;   WISDOM TOOTH EXTRACTION      There were no vitals filed for this visit.   Subjective Assessment - 11/08/20 1402     Subjective Pt. relays he is out of shape and wants to improve this as well as his balance. He is not really having pain today.    Pertinent History Chronic kidney disease, CAD, diabetic neuropathy, GERD, Gout, HTN, hypothyroidism, history of prostate cancer, PVD    Dialysis, double upright brace on Rt and AFO on Lt    Limitations Standing;Walking;House hold activities    Patient Stated Goals Get to using cane              [facility] Adult PT Treatment/Exercise - 11/08/20 0001       Ambulation/Gait   Ambulation/Gait Yes    Ambulation/Gait Assistance 6: Modified independent (Device/Increase time);4: Min assist    Ambulation Distance (Feet) 100 Feet   ea for rollator, quad cane, quad tip cane   Gait Comments trialed quad cane and quad tip cane, he was overall mod I with rollator, min guard to min A with quad cane and min A for quad tip cane, some difficulty with step through pattern and sequencing.      Neuro Re-ed    Neuro Re-ed Details  sidestepping, retrowalking 3 round trips in bars, tandem balance 30 sec X 3 bilat, rocker board A-P 2 min, lateral X 2 min, balance on foam pad feet apart 1 min X2, step ups onto foam pad X 10 bilat      Exercises   Exercises Other Exercises    Other Exercises  standing heel raises X 10, sit to stands no UE support from 24 inch bar stool X 10 reps, Nu step at end L6 X 5 min UE/LE                      PT Short Term Goals - 11/03/20 1102       PT SHORT TERM GOAL #1   Title Patient will demonstrate independent use of home  exercise program to maintain progress from in clinic treatments.    Time 3    Period Weeks    Status New    Target Date 11/24/20               PT Long Term Goals - 11/03/20 1102       PT LONG TERM GOAL #1   Title Patient will demonstrate/report  pain at worst less than or equal to 2/10 to facilitate minimal limitation in daily activity secondary to pain symptoms.    Time 10    Period Weeks    Status New    Target Date 01/12/21      PT LONG TERM GOAL #2   Title Patient will demonstrate independent use of home exercise program to facilitate ability to maintain/progress functional gains from skilled physical therapy services.    Time 10    Period Weeks    Status New    Target Date 01/12/21      PT LONG TERM GOAL #3   Title Pt. will demonstrate ambulation c quad cane c mod independence community distances > 300 ft at gait speed > 2.6 ft for community ambulation.    Time 10    Period Weeks    Status New    Target Date 01/12/21      PT LONG TERM GOAL #4   Title Pt. will demonstrate bilateral hip flexion, knee flexion strength 5/5 to facilitate progressive mobility.    Time 10    Period Weeks    Status New    Target Date 01/12/21      PT LONG TERM GOAL #5   Title Pt. will demonstrate FOTO outcome > or = 63 %    Time 10    Period Weeks    Status New    Target Date 01/12/21      Additional Long Term Goals   Additional Long Term Goals Yes      PT LONG TERM GOAL #6   Title Pt. will demonstrate BERG > 45 to indicate reduced fall risk due to condition.    Time 10    Period Weeks    Status New    Target Date 01/12/21                   Plan - 11/08/20 1437     Clinical Impression Statement Session focused on balance, coordination, endurance, and gait with rollator and cane, he is still unstable with cane so recommend to continue with rollator as we try to improve gait stability with cane, continue POC.    Personal Factors and Comorbidities Comorbidity 3+     Comorbidities Chronic kidney disease, CAD, diabetic neuropathy, GERD, Gout, HTN, hypothyroidism, history of prostate cancer, PVD    Dialysis, double upright brace on Rt and AFO on Lt    Stability/Clinical Decision Making Evolving/Moderate complexity    Rehab Potential --   fair to good   PT Frequency 2x / week    PT Duration Other (comment)   10 weeks   PT Treatment/Interventions ADLs/Self Care Home Management;Cryotherapy;Moist Heat;Balance training;Therapeutic exercise;Therapeutic activities;Functional mobility training;Stair training;Gait training;DME Instruction;Neuromuscular re-education;Patient/family education;Passive range of motion;Joint Manipulations;Splinting;Taping;Manual techniques;Orthotic Fit/Training    PT Next Visit Plan work on establishing balance HEP, Aerobic intervention balance intervention, progressing ambulation from rollator to quad cane as able.    PT Home Exercise Plan Nothing from first visit.    Consulted and Agree with Plan of Care Patient;Family member/caregiver             Patient will benefit from skilled therapeutic intervention in order to improve the following deficits and impairments:  Abnormal gait, Decreased endurance, Hypomobility, Decreased strength, Decreased activity tolerance, Decreased balance, Decreased mobility, Difficulty walking, Impaired perceived functional ability, Improper body mechanics, Postural dysfunction, Impaired flexibility, Decreased coordination, Decreased range of motion  Visit Diagnosis: Pain in left foot  Pain in right foot  Muscle weakness (generalized)  Difficulty in walking, not elsewhere classified  Abnormal posture     Problem List Patient Active Problem List   Diagnosis Date Noted   Symptomatic anemia 09/13/2020   COVID-19 virus infection 09/13/2020   Pressure injury of skin 06/29/2020   Gross hematuria 06/16/2020   Typical atrial flutter ([place]) 03/24/2020   Bilateral pleural effusion 02/24/2020   Healthcare  maintenance 01/28/2020   Shortness of breath 01/28/2020   History of partial ray amputation of fourth toe of right foot ([place]) 09/08/2019   Ulcerated, foot, right, with necrosis of bone ([place])    Chronic cough 06/25/2019   Cutaneous abscess of right foot    Subacute osteomyelitis of right foot ([place]) 12/14/2018   AKI (acute kidney injury) ([place]) 12/14/2018   ESRD (end stage renal disease) ([place]) 12/14/2018   Gait abnormality 08/24/2018   Diabetic neuropathy ([place]) 02/06/2018   Cervical myelopathy ([place]) 02/06/2018   Onychomycosis 10/30/2017   Other spondylosis with radiculopathy, cervical region 01/27/2017   Midfoot ulcer, right, limited to breakdown of skin ([place]) 11/15/2016   Lateral epicondylitis, left elbow 08/12/2016   Cellulitis of fifth toe of right foot 07/29/2016   Prostate cancer ([place]) 06/19/2016   Non-pressure chronic ulcer of other part of right foot limited to breakdown of skin ([place]) 04/04/2016   Cellulitis of leg, right 12/24/2015   Cellulitis of right lower extremity 12/24/2015   Gout 09/14/2012   HTN (hypertension) 09/14/2012   Hypothyroidism 09/14/2012   CAD (coronary artery disease) 09/14/2012   Diabetes mellitus ([place]) 09/14/2012   Hypercholesteremia    Neuropathy (HCC)     Silvestre Mesi 11/08/2020, 2:41 PM  [facility] Physical Therapy [address] [place], [place], [address] Phone: [phone]   Fax:  [phone]  Name: [name] MRN: [id number] Date of Birth: 01-29-1951

## 2020-11-10 ENCOUNTER — Encounter: Payer: Self-pay | Admitting: Rehabilitative and Restorative Service Providers"

## 2020-11-10 ENCOUNTER — Ambulatory Visit (INDEPENDENT_AMBULATORY_CARE_PROVIDER_SITE_OTHER): Payer: Medicare Other | Admitting: Rehabilitative and Restorative Service Providers"

## 2020-11-10 ENCOUNTER — Other Ambulatory Visit: Payer: Self-pay

## 2020-11-10 ENCOUNTER — Other Ambulatory Visit: Payer: Self-pay | Admitting: Internal Medicine

## 2020-11-10 DIAGNOSIS — R293 Abnormal posture: Secondary | ICD-10-CM

## 2020-11-10 DIAGNOSIS — R2681 Unsteadiness on feet: Secondary | ICD-10-CM

## 2020-11-10 DIAGNOSIS — M79671 Pain in right foot: Secondary | ICD-10-CM | POA: Diagnosis not present

## 2020-11-10 DIAGNOSIS — M6281 Muscle weakness (generalized): Secondary | ICD-10-CM

## 2020-11-10 DIAGNOSIS — M79672 Pain in left foot: Secondary | ICD-10-CM | POA: Diagnosis not present

## 2020-11-10 DIAGNOSIS — R262 Difficulty in walking, not elsewhere classified: Secondary | ICD-10-CM

## 2020-11-10 NOTE — Therapy (Signed)
[facility] Physical Therapy [address] [place], [place], [address] Phone: [phone]   Fax:  [phone]  Physical Therapy Treatment  Patient Details  Name: [name] MRN: [id number] Date of Birth: 28-Apr-1950 Referring Provider (PT): Dondra Prader NP   Encounter Date: 11/10/2020   PT End of Session - 11/10/20 1108     Visit Number 3    Number of Visits 20    Date for PT Re-Evaluation 01/12/21    Authorization Type Medicare, BCBS    Progress Note Due on Visit 10    PT Start Time 0803    PT Stop Time 0842    PT Time Calculation (min) 39 min    Equipment Utilized During Treatment Gait belt    Activity Tolerance Patient tolerated treatment well;Patient limited by fatigue    Behavior During Therapy [facility] for tasks assessed/performed             Past Medical History:  Diagnosis Date   Ambulates with cane    straight cane   Cervical myelopathy ([place]) 02/06/2018   Chronic kidney disease    dailysis M W F- home   Complication of anesthesia    Coronary artery disease    Diabetes mellitus without complication ([place])    type2   Diabetic foot ulcer ([place])    Diabetic neuropathy ([place]) 02/06/2018   Gait abnormality 08/24/2018   GERD (gastroesophageal reflux disease)     01/06/20- not current   Gout    History of kidney stones    passed stones   Hypercholesteremia    Hypertension    Hypothyroidism    Neuromuscular disorder (HCC)    neuropathy left leg and bilateral feet   Neuropathy    PONV (postoperative nausea and vomiting)    Prostate cancer ([place])    PVD (peripheral vascular disease) ([place])    with amputations    Past Surgical History:  Procedure Laterality Date   A-FLUTTER ABLATION N/A 04/06/2020   Procedure: A-FLUTTER ABLATION;  Surgeon: [name], MD;  Location: [place] [facility];  Service: Cardiovascular;  Laterality: N/A;   AMPUTATION Left 12/25/2013   Procedure: AMPUTATION RAY LEFT 5TH RAY;  Surgeon: [name], MD;  [facility];   Service: Orthopedics;  Laterality: Left;   AMPUTATION Right 12/15/2018   Procedure: AMPUTATION OF 4TH AND 5TH TOES RIGHT FOOT;  Surgeon: [name], MD;  Location: [place];  Service: Orthopedics;  Laterality: Right;   APPLICATION OF WOUND VAC Right 12/15/2018   Procedure: APPLICATION OF WOUND VAC;  Surgeon: [name], MD;  Location: [place];  Service: Orthopedics;  Laterality: Right;   AV FISTULA PLACEMENT Left 03/25/2019   Procedure: LEFT ARM ARTERIOVENOUS (AV) FISTULA CREATION;  Surgeon: [name], MD;  Location: [place] [facility];  Service: Vascular;  Laterality: Left;   [place] Left 05/20/2019   Procedure: SECOND STAGE LEFT BASCILIC VEIN TRANSPOSITION;  Surgeon: [name], MD;  Location: [place] [facility];  Service: Vascular;  Laterality: Left;   CARDIAC CATHETERIZATION  02/17/2014   CHOLECYSTECTOMY     COLONOSCOPY  2011   in [place], [place]  2008   CYSTOSCOPY N/A 06/29/2020   Procedure: [place];  Surgeon: [name], MD;  Location: [place];  Service: Urology;  Laterality: N/A;   CYSTOSCOPY WITH FULGERATION Bilateral 06/17/2020   Procedure: CYSTOSCOPY,BILATERAL RETROGRADE, CLOT EVACUATION WITH FULGERATION OF THE BLADDER;  Surgeon: Janith Lima, MD;  Location: [place];  Service: Urology;  Laterality: Bilateral;   CYSTOSCOPY WITH FULGERATION N/A 06/28/2020   Procedure: CYSTOSCOPY WITH CLOT EVACUATION AND FULGERATION OF BLEEDERS;  Surgeon: [name], MD;  Location: [place];  Service: Urology;  Laterality: N/A;  1 HR   I & D EXTREMITY Right 12/15/2018   Procedure: DEBRIDEMENT RIGHT FOOT;  Surgeon: [name], MD;  Location: [place];  Service: Orthopedics;  Laterality: Right;   I & D EXTREMITY Right 08/27/2019   Procedure: PARTIAL CUBOID EXCISION RIGHT FOOT;  Surgeon: [name], MD;  Location: [place];  Service: Orthopedics;  Laterality: Right;   I & D EXTREMITY Right 01/07/2020    Procedure: RIGHT FOOT EXCISION INFECTED BONE;  Surgeon: [name], MD;  Location: [place];  Service: Orthopedics;  Laterality: Right;   NECK SURGERY     novemver 2019   TRANSURETHRAL RESECTION OF PROSTATE N/A 06/28/2020   Procedure: TRANSURETHRAL RESECTION OF THE PROSTATE (TURP);  Surgeon: [name], MD;  Location: [place];  Service: Urology;  Laterality: N/A;   WISDOM TOOTH EXTRACTION      There were no vitals filed for this visit.   Subjective Assessment - 11/10/20 0849     Subjective Yama reports he has "no exercises to do at home."  He was given 3 exercises with pictures and written instructions for 2-3X/day HEP compliance today.    Patient is accompained by: Family member    Pertinent History Chronic kidney disease, CAD, diabetic neuropathy, GERD, Gout, HTN, hypothyroidism, history of prostate cancer, PVD    Dialysis, double upright brace on Rt and AFO on Lt    Limitations Standing;Walking;House hold activities    Patient Stated Goals Get to using cane    Currently in Pain? Yes    Pain Score 5     Pain Location Foot    Pain Orientation Left;Right    Pain Descriptors / Indicators Stabbing    Pain Type Chronic pain    Pain Radiating Towards NA    Pain Onset More than a month ago    Pain Frequency Intermittent    Aggravating Factors  Particularly with WB for long periods of time    Pain Relieving Factors Rest and medication    Effect of Pain on Daily Activities Larsen is working on transitioning from a walker to a cane and improving his WB endurance, function and gait quality/endurance    Multiple Pain Sites No                               [facility] Adult PT Treatment/Exercise - 11/10/20 0001       Posture/Postural Control   Posture/Postural Control Postural limitations    Postural Limitations Forward head;Rounded Shoulders      Neuro Re-ed    Neuro Re-ed Details  Walking in parallel bars forward and backward no hands; tandem balance 5X 20 seconds  each      Exercises   Exercises Ankle      Ankle Exercises: Seated   Other Seated Ankle Exercises Sit to stand 2 sets of 5 slow eccentrics off high/low table set 2 inches higher than a chair    Other Seated Ankle Exercises Alternating hip hike 2 sets of 10 for 3 seconds (hip abductors strength)                    PT Education -  11/10/20 1108     Education Details Gave print outs of exercises completed in the clinic for easier HEP compliance.    Person(s) Educated Patient;Spouse    Methods Explanation;Demonstration;Tactile cues;Verbal cues;Handout    Comprehension Verbal cues required;Returned demonstration;Need further instruction;Tactile cues required;Verbalized understanding              PT Short Term Goals - 11/03/20 1102       PT SHORT TERM GOAL #1   Title Patient will demonstrate independent use of home exercise program to maintain progress from in clinic treatments.    Time 3    Period Weeks    Status New    Target Date 11/24/20               PT Long Term Goals - 11/03/20 1102       PT LONG TERM GOAL #1   Title Patient will demonstrate/report pain at worst less than or equal to 2/10 to facilitate minimal limitation in daily activity secondary to pain symptoms.    Time 10    Period Weeks    Status New    Target Date 01/12/21      PT LONG TERM GOAL #2   Title Patient will demonstrate independent use of home exercise program to facilitate ability to maintain/progress functional gains from skilled physical therapy services.    Time 10    Period Weeks    Status New    Target Date 01/12/21      PT LONG TERM GOAL #3   Title Pt. will demonstrate ambulation c quad cane c mod independence community distances > 300 ft at gait speed > 2.6 ft for community ambulation.    Time 10    Period Weeks    Status New    Target Date 01/12/21      PT LONG TERM GOAL #4   Title Pt. will demonstrate bilateral hip flexion, knee flexion strength 5/5 to facilitate  progressive mobility.    Time 10    Period Weeks    Status New    Target Date 01/12/21      PT LONG TERM GOAL #5   Title Pt. will demonstrate FOTO outcome > or = 63 %    Time 10    Period Weeks    Status New    Target Date 01/12/21      Additional Long Term Goals   Additional Long Term Goals Yes      PT LONG TERM GOAL #6   Title Pt. will demonstrate BERG > 45 to indicate reduced fall risk due to condition.    Time 10    Period Weeks    Status New    Target Date 01/12/21                   Plan - 11/10/20 1109     Clinical Impression Statement Bradford has poor endurance, strength and balance.  All were worked on today in physical therapy.  Maggie was also given of 3 exercises to complete as his HEP as he claims he did not have any exercise handouts from his first 2 visits.  Continue working on general LE strength, endurance, balance and transition from a walker to a cane for increased independence with ADLs.    Personal Factors and Comorbidities Comorbidity 3+    Comorbidities Chronic kidney disease, CAD, diabetic neuropathy, GERD, Gout, HTN, hypothyroidism, history of prostate cancer, PVD    Dialysis, double upright brace on Rt and  AFO on Lt    Stability/Clinical Decision Making Evolving/Moderate complexity    Rehab Potential --   fair to good   PT Frequency 2x / week    PT Duration Other (comment)   10 weeks   PT Treatment/Interventions ADLs/Self Care Home Management;Cryotherapy;Moist Heat;Balance training;Therapeutic exercise;Therapeutic activities;Functional mobility training;Stair training;Gait training;DME Instruction;Neuromuscular re-education;Patient/family education;Passive range of motion;Joint Manipulations;Splinting;Taping;Manual techniques;Orthotic Fit/Training    PT Next Visit Plan General LE strength, balance, gait with a cane    PT Home Exercise Plan [email or web address]    Consulted and Agree with Plan of Care Patient;Family member/caregiver             Patient will  benefit from skilled therapeutic intervention in order to improve the following deficits and impairments:  Abnormal gait, Decreased endurance, Hypomobility, Decreased strength, Decreased activity tolerance, Decreased balance, Decreased mobility, Difficulty walking, Impaired perceived functional ability, Improper body mechanics, Postural dysfunction, Impaired flexibility, Decreased coordination, Decreased range of motion  Visit Diagnosis: Difficulty in walking, not elsewhere classified  Muscle weakness (generalized)  Pain in left foot  Pain in right foot  Abnormal posture  Unsteadiness on feet     Problem List Patient Active Problem List   Diagnosis Date Noted   Symptomatic anemia 09/13/2020   COVID-19 virus infection 09/13/2020   Pressure injury of skin 06/29/2020   Gross hematuria 06/16/2020   Typical atrial flutter ([place]) 03/24/2020   Bilateral pleural effusion 02/24/2020   Healthcare maintenance 01/28/2020   Shortness of breath 01/28/2020   History of partial ray amputation of fourth toe of right foot ([place]) 09/08/2019   Ulcerated, foot, right, with necrosis of bone ([place])    Chronic cough 06/25/2019   Cutaneous abscess of right foot    Subacute osteomyelitis of right foot ([place]) 12/14/2018   AKI (acute kidney injury) ([place]) 12/14/2018   ESRD (end stage renal disease) ([place]) 12/14/2018   Gait abnormality 08/24/2018   Diabetic neuropathy ([place]) 02/06/2018   Cervical myelopathy ([place]) 02/06/2018   Onychomycosis 10/30/2017   Other spondylosis with radiculopathy, cervical region 01/27/2017   Midfoot ulcer, right, limited to breakdown of skin ([place]) 11/15/2016   Lateral epicondylitis, left elbow 08/12/2016   Cellulitis of fifth toe of right foot 07/29/2016   Prostate cancer ([name]) 06/19/2016   Non-pressure chronic ulcer of other part of right foot limited to breakdown of skin ([place]) 04/04/2016   Cellulitis of leg, right 12/24/2015   Cellulitis of right lower extremity 12/24/2015    Gout 09/14/2012   HTN (hypertension) 09/14/2012   Hypothyroidism 09/14/2012   CAD (coronary artery disease) 09/14/2012   Diabetes mellitus ([place]) 09/14/2012   Hypercholesteremia    Neuropathy ([place])     Farley Ly PT, MPT 11/10/2020, [facility] [address] [place], [place], [address] Phone: [phone]   Fax:  [phone]  Name: Kenly Xiao MRN: [id number] Date of Birth: [birth year]

## 2020-11-10 NOTE — Patient Instructions (Signed)
Access Code: [email or web address] URL: [email or web address] Date: 11/10/2020 Prepared by: Vista Mink  Exercises Sit to Stand with Armchair - 2 x daily - 7 x weekly - 1 sets - 5 reps Tandem Stance - 3 x daily - 7 x weekly - 1 sets - 4 reps - 20 second hold Standing Hip Hiking - 3 x daily - 7 x weekly - 10 sets - 10 reps - 3 seconds hold

## 2020-11-15 ENCOUNTER — Other Ambulatory Visit: Payer: Self-pay

## 2020-11-15 ENCOUNTER — Encounter: Payer: Self-pay | Admitting: Rehabilitative and Restorative Service Providers"

## 2020-11-15 ENCOUNTER — Ambulatory Visit (INDEPENDENT_AMBULATORY_CARE_PROVIDER_SITE_OTHER): Payer: Medicare Other | Admitting: Rehabilitative and Restorative Service Providers"

## 2020-11-15 DIAGNOSIS — M79672 Pain in left foot: Secondary | ICD-10-CM | POA: Diagnosis not present

## 2020-11-15 DIAGNOSIS — M79671 Pain in right foot: Secondary | ICD-10-CM | POA: Diagnosis not present

## 2020-11-15 DIAGNOSIS — R262 Difficulty in walking, not elsewhere classified: Secondary | ICD-10-CM

## 2020-11-15 DIAGNOSIS — M6281 Muscle weakness (generalized): Secondary | ICD-10-CM | POA: Diagnosis not present

## 2020-11-15 DIAGNOSIS — R293 Abnormal posture: Secondary | ICD-10-CM

## 2020-11-15 NOTE — Therapy (Signed)
[facility] [address] [place], [place], [address] Phone: [phone]   Fax:  [phone]  Physical Therapy Treatment  Patient Details  Name: [name] MRN: [id number] Date of Birth: 09-04-1950 Referring Provider (PT): Dondra Prader NP   Encounter Date: 11/15/2020   PT End of Session - 11/15/20 1546     Visit Number 4    Number of Visits 20    Date for PT Re-Evaluation 01/12/21    Authorization Type Medicare, BCBS    Progress Note Due on Visit 10    PT Start Time 1547    PT Stop Time 1626    PT Time Calculation (min) 39 min    Equipment Utilized During Treatment Gait belt    Activity Tolerance Patient limited by fatigue    Behavior During Therapy [facility] for tasks assessed/performed             Past Medical History:  Diagnosis Date   Ambulates with cane    straight cane   Cervical myelopathy ([place]) 02/06/2018   Chronic kidney disease    dailysis M W F- home   Complication of anesthesia    Coronary artery disease    Diabetes mellitus without complication ([place])    type2   Diabetic foot ulcer ([place])    Diabetic neuropathy ([place]) 02/06/2018   Gait abnormality 08/24/2018   GERD (gastroesophageal reflux disease)     01/06/20- not current   Gout    History of kidney stones    passed stones   Hypercholesteremia    Hypertension    Hypothyroidism    Neuromuscular disorder (HCC)    neuropathy left leg and bilateral feet   Neuropathy    PONV (postoperative nausea and vomiting)    Prostate cancer ([place])    PVD (peripheral vascular disease) ([place])    with amputations    Past Surgical History:  Procedure Laterality Date   A-FLUTTER ABLATION N/A 04/06/2020   Procedure: A-FLUTTER ABLATION;  Surgeon: [name], MD;  Location: [place] [facility];  Service: Cardiovascular;  Laterality: N/A;   AMPUTATION Left 12/25/2013   Procedure: AMPUTATION RAY LEFT 5TH RAY;  Surgeon: [name], MD;  [facility];  Service: Orthopedics;   Laterality: Left;   AMPUTATION Right 12/15/2018   Procedure: AMPUTATION OF 4TH AND 5TH TOES RIGHT FOOT;  Surgeon: [name], MD;  Location: [place];  Service: Orthopedics;  Laterality: Right;   APPLICATION OF WOUND VAC Right 12/15/2018   Procedure: APPLICATION OF WOUND VAC;  Surgeon: [name], MD;  Location: [place];  Service: Orthopedics;  Laterality: Right;   AV FISTULA PLACEMENT Left 03/25/2019   Procedure: LEFT ARM ARTERIOVENOUS (AV) FISTULA CREATION;  Surgeon: [name], MD;  Location: [place] [facility];  Service: Vascular;  Laterality: Left;   [place] Left 05/20/2019   Procedure: SECOND STAGE LEFT BASCILIC VEIN TRANSPOSITION;  Surgeon: [name], MD;  Location: [place] [facility];  Service: Vascular;  Laterality: Left;   CARDIAC CATHETERIZATION  02/17/2014   CHOLECYSTECTOMY     COLONOSCOPY  2011   in [place], [place]  2008   CYSTOSCOPY N/A 06/29/2020   Procedure: [place];  Surgeon: [name], MD;  Location: [place];  Service: Urology;  Laterality: N/A;   CYSTOSCOPY WITH FULGERATION Bilateral 06/17/2020   Procedure: CYSTOSCOPY,BILATERAL RETROGRADE, CLOT EVACUATION WITH FULGERATION OF THE BLADDER;  Surgeon: Abner Greenspan,  Bonna Gains, MD;  Location: [place];  Service: Urology;  Laterality: Bilateral;   CYSTOSCOPY WITH FULGERATION N/A 06/28/2020   Procedure: CYSTOSCOPY WITH CLOT EVACUATION AND FULGERATION OF BLEEDERS;  Surgeon: [name], MD;  Location: [place];  Service: Urology;  Laterality: N/A;  1 HR   I & D EXTREMITY Right 12/15/2018   Procedure: DEBRIDEMENT RIGHT FOOT;  Surgeon: [name], MD;  Location: [place];  Service: Orthopedics;  Laterality: Right;   I & D EXTREMITY Right 08/27/2019   Procedure: PARTIAL CUBOID EXCISION RIGHT FOOT;  Surgeon: [name], MD;  Location: [place];  Service: Orthopedics;  Laterality: Right;   I & D EXTREMITY Right 01/07/2020   Procedure: RIGHT FOOT  EXCISION INFECTED BONE;  Surgeon: [name], MD;  Location: [place];  Service: Orthopedics;  Laterality: Right;   NECK SURGERY     novemver 2019   TRANSURETHRAL RESECTION OF PROSTATE N/A 06/28/2020   Procedure: TRANSURETHRAL RESECTION OF THE PROSTATE (TURP);  Surgeon: [name], MD;  Location: [place];  Service: Urology;  Laterality: N/A;   WISDOM TOOTH EXTRACTION      There were no vitals filed for this visit.   Subjective Assessment - 11/15/20 1551     Subjective Pt. indicated no specific pain changes upon arrival.  Similar normal foot pain indicated.    Patient is accompained by: Family member    Pertinent History Chronic kidney disease, CAD, diabetic neuropathy, GERD, Gout, HTN, hypothyroidism, history of prostate cancer, PVD    Dialysis, double upright brace on Rt and AFO on Lt    Limitations Standing;Walking;House hold activities    Patient Stated Goals Get to using cane    Currently in Pain? Yes    Pain Score 7    at worst   Pain Location Foot    Pain Orientation Right    Pain Onset More than a month ago    Pain Frequency Constant    Aggravating Factors  nighttime    Pain Relieving Factors rest                               [facility] Adult PT Treatment/Exercise - 11/15/20 0001       Ambulation/Gait   Ambulation/Gait Yes    Gait Comments Quad cane in Rt UE, cues and sequencing information given to improve coordination/control.  CGA with occasional Min A required during performance.  Used exclusively in clinc today instead of rollator.  Distances of 60 ft, 80 ft c reduced gait speed noted overall.      Neuro Re-ed    Neuro Re-ed Details  BIG inspired fwd stepping and return c Min A at times to prevent loss of balance x 15 bilateral.  Hip strategy fwd movement off clinician to replicate desired posterior deviation of balance. Modified tandem stance 1 min x 1 bilateral, side stepping in // bars 10 ft x 5 each way c 2 hands on bar      Ankle Exercises:  Aerobic   Nustep Lvl 5 10 mins                      PT Short Term Goals - 11/15/20 1630       PT SHORT TERM GOAL #1   Title Patient will demonstrate independent use of home exercise program to maintain progress from in clinic treatments.    Time 3    Period Weeks  Status On-going    Target Date 11/24/20               PT Long Term Goals - 11/03/20 1102       PT LONG TERM GOAL #1   Title Patient will demonstrate/report pain at worst less than or equal to 2/10 to facilitate minimal limitation in daily activity secondary to pain symptoms.    Time 10    Period Weeks    Status New    Target Date 01/12/21      PT LONG TERM GOAL #2   Title Patient will demonstrate independent use of home exercise program to facilitate ability to maintain/progress functional gains from skilled physical therapy services.    Time 10    Period Weeks    Status New    Target Date 01/12/21      PT LONG TERM GOAL #3   Title Pt. will demonstrate ambulation c quad cane c mod independence community distances > 300 ft at gait speed > 2.6 ft for community ambulation.    Time 10    Period Weeks    Status New    Target Date 01/12/21      PT LONG TERM GOAL #4   Title Pt. will demonstrate bilateral hip flexion, knee flexion strength 5/5 to facilitate progressive mobility.    Time 10    Period Weeks    Status New    Target Date 01/12/21      PT LONG TERM GOAL #5   Title Pt. will demonstrate FOTO outcome > or = 63 %    Time 10    Period Weeks    Status New    Target Date 01/12/21      Additional Long Term Goals   Additional Long Term Goals Yes      PT LONG TERM GOAL #6   Title Pt. will demonstrate BERG > 45 to indicate reduced fall risk due to condition.    Time 10    Period Weeks    Status New    Target Date 01/12/21                   Plan - 11/15/20 1555     Clinical Impression Statement Fatigue noted overall.  Pt. did rely on assistance from clinician  throughout intervention for prevention of loss of balance.  Absent ankle strategy response results in focus needed on improving hip reaction and stepping response.    Personal Factors and Comorbidities Comorbidity 3+    Comorbidities Chronic kidney disease, CAD, diabetic neuropathy, GERD, Gout, HTN, hypothyroidism, history of prostate cancer, PVD    Dialysis, double upright brace on Rt and AFO on Lt    Stability/Clinical Decision Making Evolving/Moderate complexity    Rehab Potential --   fair to good   PT Frequency 2x / week    PT Duration Other (comment)   10 weeks   PT Treatment/Interventions ADLs/Self Care Home Management;Cryotherapy;Moist Heat;Balance training;Therapeutic exercise;Therapeutic activities;Functional mobility training;Stair training;Gait training;DME Instruction;Neuromuscular re-education;Patient/family education;Passive range of motion;Joint Manipulations;Splinting;Taping;Manual techniques;Orthotic Fit/Training    PT Next Visit Plan Quad cane practice, improved hip strategy    PT Home Exercise Plan [email or web address]    Consulted and Agree with Plan of Care Patient;Family member/caregiver             Patient will benefit from skilled therapeutic intervention in order to improve the following deficits and impairments:  Abnormal gait, Decreased endurance, Hypomobility, Decreased strength, Decreased activity tolerance, Decreased balance, Decreased mobility,  Difficulty walking, Impaired perceived functional ability, Improper body mechanics, Postural dysfunction, Impaired flexibility, Decreased coordination, Decreased range of motion  Visit Diagnosis: Pain in left foot  Pain in right foot  Muscle weakness (generalized)  Difficulty in walking, not elsewhere classified  Abnormal posture     Problem List Patient Active Problem List   Diagnosis Date Noted   Symptomatic anemia 09/13/2020   COVID-19 virus infection 09/13/2020   Pressure injury of skin 06/29/2020   Gross  hematuria 06/16/2020   Typical atrial flutter ([place]) 03/24/2020   Bilateral pleural effusion 02/24/2020   Healthcare maintenance 01/28/2020   Shortness of breath 01/28/2020   History of partial ray amputation of fourth toe of right foot ([place]) 09/08/2019   Ulcerated, foot, right, with necrosis of bone ([place])    Chronic cough 06/25/2019   Cutaneous abscess of right foot    Subacute osteomyelitis of right foot ([place]) 12/14/2018   AKI (acute kidney injury) ([place]) 12/14/2018   ESRD (end stage renal disease) ([place]) 12/14/2018   Gait abnormality 08/24/2018   Diabetic neuropathy ([place]) 02/06/2018   Cervical myelopathy ([place]) 02/06/2018   Onychomycosis 10/30/2017   Other spondylosis with radiculopathy, cervical region 01/27/2017   Midfoot ulcer, right, limited to breakdown of skin ([place]) 11/15/2016   Lateral epicondylitis, left elbow 08/12/2016   Cellulitis of fifth toe of right foot 07/29/2016   Prostate cancer ([place]) 06/19/2016   Non-pressure chronic ulcer of other part of right foot limited to breakdown of skin ([place]) 04/04/2016   Cellulitis of leg, right 12/24/2015   Cellulitis of right lower extremity 12/24/2015   Gout 09/14/2012   HTN (hypertension) 09/14/2012   Hypothyroidism 09/14/2012   CAD (coronary artery disease) 09/14/2012   Diabetes mellitus ([place]) 09/14/2012   Hypercholesteremia    Neuropathy ([place])    Scot Jun, PT, DPT, OCS, ATC 11/15/20  4:31 PM    [facility] [address] [place], [place], [address] Phone: [phone]   Fax:  [phone]  Name: Rashon Westrup MRN: [id number] Date of Birth: 1951/03/13

## 2020-11-17 ENCOUNTER — Encounter: Payer: Self-pay | Admitting: Physical Therapy

## 2020-11-17 ENCOUNTER — Other Ambulatory Visit: Payer: Self-pay

## 2020-11-17 ENCOUNTER — Ambulatory Visit (INDEPENDENT_AMBULATORY_CARE_PROVIDER_SITE_OTHER): Payer: Medicare Other | Admitting: Physical Therapy

## 2020-11-17 DIAGNOSIS — R262 Difficulty in walking, not elsewhere classified: Secondary | ICD-10-CM | POA: Diagnosis not present

## 2020-11-17 DIAGNOSIS — M79671 Pain in right foot: Secondary | ICD-10-CM

## 2020-11-17 DIAGNOSIS — M6281 Muscle weakness (generalized): Secondary | ICD-10-CM

## 2020-11-17 DIAGNOSIS — M79672 Pain in left foot: Secondary | ICD-10-CM

## 2020-11-17 DIAGNOSIS — R293 Abnormal posture: Secondary | ICD-10-CM

## 2020-11-17 DIAGNOSIS — R2681 Unsteadiness on feet: Secondary | ICD-10-CM

## 2020-11-17 NOTE — Therapy (Signed)
[facility] [address] [place], [place], [address] Phone: [phone]   Fax:  [phone]  Physical Therapy Treatment  Patient Details  Name: [name] MRN: [id number] Date of Birth: [birth year] Referring Provider (PT): Dondra Prader NP   Encounter Date: 11/17/2020   PT End of Session - 11/17/20 1016     Visit Number 5    Number of Visits 20    Date for PT Re-Evaluation 01/12/21    Authorization Type Medicare, BCBS    Progress Note Due on Visit 10    PT Start Time 0844    PT Stop Time 0927    PT Time Calculation (min) 43 min    Equipment Utilized During Treatment Gait belt    Activity Tolerance Patient limited by fatigue    Behavior During Therapy [facility] for tasks assessed/performed             Past Medical History:  Diagnosis Date   Ambulates with cane    straight cane   Cervical myelopathy ([place]) 02/06/2018   Chronic kidney disease    dailysis M W F- home   Complication of anesthesia    Coronary artery disease    Diabetes mellitus without complication ([place])    type2   Diabetic foot ulcer ([place])    Diabetic neuropathy ([place]) 02/06/2018   Gait abnormality 08/24/2018   GERD (gastroesophageal reflux disease)     01/06/20- not current   Gout    History of kidney stones    passed stones   Hypercholesteremia    Hypertension    Hypothyroidism    Neuromuscular disorder ([place])    neuropathy left leg and bilateral feet   Neuropathy    PONV (postoperative nausea and vomiting)    Prostate cancer ([place])    PVD (peripheral vascular disease) ([place])    with amputations    Past Surgical History:  Procedure Laterality Date   A-FLUTTER ABLATION N/A 04/06/2020   Procedure: A-FLUTTER ABLATION;  Surgeon: [name], MD;  Location: [place] [facility];  Service: Cardiovascular;  Laterality: N/A;   AMPUTATION Left 12/25/2013   Procedure: AMPUTATION RAY LEFT 5TH RAY;  Surgeon: [name], MD;  [facility];  Service: Orthopedics;   Laterality: Left;   AMPUTATION Right 12/15/2018   Procedure: AMPUTATION OF 4TH AND 5TH TOES RIGHT FOOT;  Surgeon: [name], MD;  Location: [place];  Service: Orthopedics;  Laterality: Right;   APPLICATION OF WOUND VAC Right 12/15/2018   Procedure: APPLICATION OF WOUND VAC;  Surgeon: [name], MD;  Location: [place];  Service: Orthopedics;  Laterality: Right;   AV FISTULA PLACEMENT Left 03/25/2019   Procedure: LEFT ARM ARTERIOVENOUS (AV) FISTULA CREATION;  Surgeon: [name], MD;  Location: [place] [facility];  Service: Vascular;  Laterality: Left;   [place] Left 05/20/2019   Procedure: SECOND STAGE LEFT BASCILIC VEIN TRANSPOSITION;  Surgeon: [name], MD;  Location: [place] [facility];  Service: Vascular;  Laterality: Left;   CARDIAC CATHETERIZATION  02/17/2014   CHOLECYSTECTOMY     COLONOSCOPY  2011   in [place], [place]  2008   CYSTOSCOPY N/A 06/29/2020   Procedure: [place];  Surgeon: [name], MD;  Location: [place];  Service: Urology;  Laterality: N/A;   CYSTOSCOPY WITH FULGERATION Bilateral 06/17/2020   Procedure: CYSTOSCOPY,BILATERAL RETROGRADE, CLOT EVACUATION WITH FULGERATION OF THE BLADDER;  Surgeon: Abner Greenspan,  Bonna Gains, MD;  Location: [place];  Service: Urology;  Laterality: Bilateral;   CYSTOSCOPY WITH FULGERATION N/A 06/28/2020   Procedure: CYSTOSCOPY WITH CLOT EVACUATION AND FULGERATION OF BLEEDERS;  Surgeon: [name], MD;  Location: [place];  Service: Urology;  Laterality: N/A;  1 HR   I & D EXTREMITY Right 12/15/2018   Procedure: DEBRIDEMENT RIGHT FOOT;  Surgeon: [name], MD;  Location: [place];  Service: Orthopedics;  Laterality: Right;   I & D EXTREMITY Right 08/27/2019   Procedure: PARTIAL CUBOID EXCISION RIGHT FOOT;  Surgeon: [name], MD;  Location: [place];  Service: Orthopedics;  Laterality: Right;   I & D EXTREMITY Right 01/07/2020   Procedure: RIGHT FOOT  EXCISION INFECTED BONE;  Surgeon: [name], MD;  Location: [place];  Service: Orthopedics;  Laterality: Right;   NECK SURGERY     novemver 2019   TRANSURETHRAL RESECTION OF PROSTATE N/A 06/28/2020   Procedure: TRANSURETHRAL RESECTION OF THE PROSTATE (TURP);  Surgeon: [name], MD;  Location: [place];  Service: Urology;  Laterality: N/A;   WISDOM TOOTH EXTRACTION      There were no vitals filed for this visit.   Subjective Assessment - 11/17/20 0842     Subjective Rt foot "hurts all the time"    Patient is accompained by: Family member    Pertinent History Chronic kidney disease, CAD, diabetic neuropathy, GERD, Gout, HTN, hypothyroidism, history of prostate cancer, PVD    Dialysis, double upright brace on Rt and AFO on Lt    Limitations Standing;Walking;House hold activities    Patient Stated Goals Get to using cane    Currently in Pain? Yes    Pain Score 5     Pain Location Foot    Pain Orientation Right    Pain Descriptors / Indicators Stabbing    Pain Type Chronic pain    Pain Onset More than a month ago    Pain Frequency Constant    Aggravating Factors  nighttime    Pain Relieving Factors rest                               [facility] Adult PT Treatment/Exercise - 11/17/20 0845       Ambulation/Gait   Ambulation/Gait Yes    Ambulation/Gait Assistance 4: Min guard    Ambulation Distance (Feet) 120 Feet    Assistive device Small based quad cane    Gait Comments min cues needed for step width as pt tends to cross/overap feet when walking.  Tends to veer to side with amb with [facility]      Ankle Exercises: Aerobic   Nustep Lvl 6 10 mins      Ankle Exercises: Standing   Other Standing Ankle Exercises squats x 20 reps bil UE support    Other Standing Ankle Exercises hip abdct x 10 reps bil                 Balance Exercises - 11/17/20 0907       Balance Exercises: Standing   Standing Eyes Opened Solid surface;Head turns;Foam/compliant  surface    Standing Eyes Closed Solid surface;Wide (BOA);Narrow base of support (BOS);2 reps;10 secs    Tandem Gait Upper extremity support;4 reps    Sidestepping Upper extremity support;4 reps                 PT Short Term Goals - 11/15/20 1630  PT SHORT TERM GOAL #1   Title Patient will demonstrate independent use of home exercise program to maintain progress from in clinic treatments.    Time 3    Period Weeks    Status On-going    Target Date 11/24/20               PT Long Term Goals - 11/03/20 1102       PT LONG TERM GOAL #1   Title Patient will demonstrate/report pain at worst less than or equal to 2/10 to facilitate minimal limitation in daily activity secondary to pain symptoms.    Time 10    Period Weeks    Status New    Target Date 01/12/21      PT LONG TERM GOAL #2   Title Patient will demonstrate independent use of home exercise program to facilitate ability to maintain/progress functional gains from skilled physical therapy services.    Time 10    Period Weeks    Status New    Target Date 01/12/21      PT LONG TERM GOAL #3   Title Pt. will demonstrate ambulation c quad cane c mod independence community distances > 300 ft at gait speed > 2.6 ft for community ambulation.    Time 10    Period Weeks    Status New    Target Date 01/12/21      PT LONG TERM GOAL #4   Title Pt. will demonstrate bilateral hip flexion, knee flexion strength 5/5 to facilitate progressive mobility.    Time 10    Period Weeks    Status New    Target Date 01/12/21      PT LONG TERM GOAL #5   Title Pt. will demonstrate FOTO outcome > or = 63 %    Time 10    Period Weeks    Status New    Target Date 01/12/21      Additional Long Term Goals   Additional Long Term Goals Yes      PT LONG TERM GOAL #6   Title Pt. will demonstrate BERG > 45 to indicate reduced fall risk due to condition.    Time 10    Period Weeks    Status New    Target Date 01/12/21                    Plan - 11/17/20 1017     Clinical Impression Statement Pt tolerated session well needing occasional seated rest breaks.  Better balance today with amb with [facility] as pt able without LOB but still unsteady with cane.  Will continue to benefit from PT to maximize function.  Increased difficulty with reduction in somatosensory and visual feedback for balance.    Personal Factors and Comorbidities Comorbidity 3+    Comorbidities Chronic kidney disease, CAD, diabetic neuropathy, GERD, Gout, HTN, hypothyroidism, history of prostate cancer, PVD    Dialysis, double upright brace on Rt and AFO on Lt    Stability/Clinical Decision Making Evolving/Moderate complexity    Rehab Potential --   fair to good   PT Frequency 2x / week    PT Duration Other (comment)   10 weeks   PT Treatment/Interventions ADLs/Self Care Home Management;Cryotherapy;Moist Heat;Balance training;Therapeutic exercise;Therapeutic activities;Functional mobility training;Stair training;Gait training;DME Instruction;Neuromuscular re-education;Patient/family education;Passive range of motion;Joint Manipulations;Splinting;Taping;Manual techniques;Orthotic Fit/Training    PT Next Visit Plan Quad cane practice, improved hip strategy, continue with static/dynamic balance tasks    PT Home Exercise Plan  [id number]    Consulted and Agree with Plan of Care Patient;Family member/caregiver             Patient will benefit from skilled therapeutic intervention in order to improve the following deficits and impairments:  Abnormal gait, Decreased endurance, Hypomobility, Decreased strength, Decreased activity tolerance, Decreased balance, Decreased mobility, Difficulty walking, Impaired perceived functional ability, Improper body mechanics, Postural dysfunction, Impaired flexibility, Decreased coordination, Decreased range of motion  Visit Diagnosis: Pain in left foot  Pain in right foot  Muscle weakness  (generalized)  Difficulty in walking, not elsewhere classified  Abnormal posture  Unsteadiness on feet     Problem List Patient Active Problem List   Diagnosis Date Noted   Symptomatic anemia 09/13/2020   COVID-19 virus infection 09/13/2020   Pressure injury of skin 06/29/2020   Gross hematuria 06/16/2020   Typical atrial flutter ([place]) 03/24/2020   Bilateral pleural effusion 02/24/2020   Healthcare maintenance 01/28/2020   Shortness of breath 01/28/2020   History of partial ray amputation of fourth toe of right foot ([place]) 09/08/2019   Ulcerated, foot, right, with necrosis of bone (H[facility])    Chronic cough 06/25/2019   Cutaneous abscess of right foot    Subacute osteomyelitis of right foot ([place]) 12/14/2018   AKI (acute kidney injury) ([place]) 12/14/2018   ESRD (end stage renal disease) ([place]) 12/14/2018   Gait abnormality 08/24/2018   Diabetic neuropathy ([place]) 02/06/2018   Cervical myelopathy ([place]) 02/06/2018   Onychomycosis 10/30/2017   Other spondylosis with radiculopathy, cervical region 01/27/2017   Midfoot ulcer, right, limited to breakdown of skin ([place]) 11/15/2016   Lateral epicondylitis, left elbow 08/12/2016   Cellulitis of fifth toe of right foot 07/29/2016   Prostate cancer ([place]) 06/19/2016   Non-pressure chronic ulcer of other part of right foot limited to breakdown of skin ([place]) 04/04/2016   Cellulitis of leg, right 12/24/2015   Cellulitis of right lower extremity 12/24/2015   Gout 09/14/2012   HTN (hypertension) 09/14/2012   Hypothyroidism 09/14/2012   CAD (coronary artery disease) 09/14/2012   Diabetes mellitus ([place]) 09/14/2012   Hypercholesteremia    Neuropathy ([place])       Laureen Abrahams, PT, DPT 11/17/20 10:21 AM      [facility] [address] [place], [place], [address] Phone: [phone]   Fax:  [phone]  Name: [name] MRN: [id number] Date of Birth: [birth year]

## 2020-11-22 ENCOUNTER — Encounter: Payer: Self-pay | Admitting: Rehabilitative and Restorative Service Providers"

## 2020-11-22 ENCOUNTER — Other Ambulatory Visit: Payer: Self-pay

## 2020-11-22 ENCOUNTER — Ambulatory Visit (INDEPENDENT_AMBULATORY_CARE_PROVIDER_SITE_OTHER): Payer: Medicare Other | Admitting: Rehabilitative and Restorative Service Providers"

## 2020-11-22 DIAGNOSIS — R262 Difficulty in walking, not elsewhere classified: Secondary | ICD-10-CM | POA: Diagnosis not present

## 2020-11-22 DIAGNOSIS — R293 Abnormal posture: Secondary | ICD-10-CM

## 2020-11-22 DIAGNOSIS — M6281 Muscle weakness (generalized): Secondary | ICD-10-CM | POA: Diagnosis not present

## 2020-11-22 DIAGNOSIS — M79671 Pain in right foot: Secondary | ICD-10-CM

## 2020-11-22 DIAGNOSIS — M79672 Pain in left foot: Secondary | ICD-10-CM

## 2020-11-22 NOTE — Therapy (Addendum)
[facility] [address] [place], [place], [address] Phone: [phone]   Fax:  [phone]  Physical Therapy Treatment  Patient Details  Name: [name] MRN: [id number] Date of Birth: Dec 03, 1950 Referring Provider (PT): Dondra Prader NP   Encounter Date: 11/22/2020   PT End of Session - 11/22/20 1628     Visit Number 6    Number of Visits 20    Date for PT Re-Evaluation 01/12/21    Authorization Type Medicare, BCBS    Progress Note Due on Visit 10    PT Start Time 1554    PT Stop Time 1634    PT Time Calculation (min) 40 min    Equipment Utilized During Treatment Gait belt    Activity Tolerance Patient limited by fatigue    Behavior During Therapy [facility] for tasks assessed/performed             Past Medical History:  Diagnosis Date   Ambulates with cane    straight cane   Cervical myelopathy ([place]) 02/06/2018   Chronic kidney disease    dailysis M W F- home   Complication of anesthesia    Coronary artery disease    Diabetes mellitus without complication ([place])    type2   Diabetic foot ulcer ([place])    Diabetic neuropathy ([place]) 02/06/2018   Gait abnormality 08/24/2018   GERD (gastroesophageal reflux disease)     01/06/20- not current   Gout    History of kidney stones    passed stones   Hypercholesteremia    Hypertension    Hypothyroidism    Neuromuscular disorder (HCC)    neuropathy left leg and bilateral feet   Neuropathy    PONV (postoperative nausea and vomiting)    Prostate cancer ([place])    PVD (peripheral vascular disease) ([place])    with amputations    Past Surgical History:  Procedure Laterality Date   A-FLUTTER ABLATION N/A 04/06/2020   Procedure: A-FLUTTER ABLATION;  Surgeon: [name], MD;  Location: [place] [facility];  Service: Cardiovascular;  Laterality: N/A;   AMPUTATION Left 12/25/2013   Procedure: AMPUTATION RAY LEFT 5TH RAY;  Surgeon: [name], MD;  [facility];  Service: Orthopedics;   Laterality: Left;   AMPUTATION Right 12/15/2018   Procedure: AMPUTATION OF 4TH AND 5TH TOES RIGHT FOOT;  Surgeon: [name], MD;  Location: [place];  Service: Orthopedics;  Laterality: Right;   APPLICATION OF WOUND VAC Right 12/15/2018   Procedure: APPLICATION OF WOUND VAC;  Surgeon: [name], MD;  Location: [place];  Service: Orthopedics;  Laterality: Right;   AV FISTULA PLACEMENT Left 03/25/2019   Procedure: LEFT ARM ARTERIOVENOUS (AV) FISTULA CREATION;  Surgeon: [name], MD;  Location: [place] [place] [facility];  Service: Vascular;  Laterality: Left;   [place] Left 05/20/2019   Procedure: SECOND STAGE LEFT BASCILIC VEIN TRANSPOSITION;  Surgeon: [name], MD;  Location: [place] [facility];  Service: Vascular;  Laterality: Left;   CARDIAC CATHETERIZATION  02/17/2014   CHOLECYSTECTOMY     COLONOSCOPY  2011   in [place], [place]  2008   CYSTOSCOPY N/A 06/29/2020   Procedure: [place];  Surgeon: [name], MD;  Location: [place];  Service: Urology;  Laterality: N/A;   CYSTOSCOPY WITH FULGERATION Bilateral 06/17/2020   Procedure: CYSTOSCOPY,BILATERAL RETROGRADE, CLOT EVACUATION WITH FULGERATION OF THE BLADDER;  Surgeon: Abner Greenspan,  Bonna Gains, MD;  Location: [place];  Service: Urology;  Laterality: Bilateral;   CYSTOSCOPY WITH FULGERATION N/A 06/28/2020   Procedure: CYSTOSCOPY WITH CLOT EVACUATION AND FULGERATION OF BLEEDERS;  Surgeon: [name], MD;  Location: [place];  Service: Urology;  Laterality: N/A;  1 HR   I & D EXTREMITY Right 12/15/2018   Procedure: DEBRIDEMENT RIGHT FOOT;  Surgeon: [name], MD;  Location: [place];  Service: Orthopedics;  Laterality: Right;   I & D EXTREMITY Right 08/27/2019   Procedure: PARTIAL CUBOID EXCISION RIGHT FOOT;  Surgeon: [name], MD;  Location: [place];  Service: Orthopedics;  Laterality: Right;   I & D EXTREMITY Right 01/07/2020   Procedure: RIGHT FOOT  EXCISION INFECTED BONE;  Surgeon: [name], MD;  Location: [place];  Service: Orthopedics;  Laterality: Right;   NECK SURGERY     novemver 2019   TRANSURETHRAL RESECTION OF PROSTATE N/A 06/28/2020   Procedure: TRANSURETHRAL RESECTION OF THE PROSTATE (TURP);  Surgeon: [name], MD;  Location: [place];  Service: Urology;  Laterality: N/A;   WISDOM TOOTH EXTRACTION      There were no vitals filed for this visit.           11/24/20 1116  Symptoms/Limitations  Subjective Continued foot pain, worse at night, moderate level during day.  Patient is accompained by: Family member  Pertinent History Chronic kidney disease, CAD, diabetic neuropathy, GERD, Gout, HTN, hypothyroidism, history of prostate cancer, PVD    Dialysis, double upright brace on Rt and AFO on Lt  Limitations Standing;Walking;House hold activities  Patient Stated Goals Get to using cane  Pain Assessment  Currently in Pain? Yes  Pain Score 5  Pain Location Foot  Pain Orientation Right  Pain Descriptors / Indicators Stabbing  Pain Type Chronic pain  Pain Onset More than a month ago  Pain Frequency Constant  Aggravating Factors  nighttime, insidious  Pain Relieving Factors rest               [facility] Adult PT Treatment/Exercise - 11/22/20 0001       Ambulation/Gait   Gait Comments SBA 200 ft ambulation c quad cane      Neuro Re-ed    Neuro Re-ed Details  feet together stance 3 mins c various EO, EC, head turns left/right, up/down c SBA.  BIG inspired stepping reverse x 15 bilateral      Ankle Exercises: Aerobic   Nustep Lvl 6 12 mins      Ankle Exercises: Standing   Other Standing Ankle Exercises standing hip abduction 2 x 10 bilateral                      PT Short Term Goals - 11/15/20 1630       PT SHORT TERM GOAL #1   Title Patient will demonstrate independent use of home exercise program to maintain progress from in clinic treatments.    Time 3    Period Weeks     Status On-going    Target Date 11/24/20               PT Long Term Goals - 11/03/20 1102       PT LONG TERM GOAL #1   Title Patient will demonstrate/report pain at worst less than or equal to 2/10 to facilitate minimal limitation in daily activity secondary to pain symptoms.    Time 10    Period Weeks    Status New  Target Date 01/12/21      PT LONG TERM GOAL #2   Title Patient will demonstrate independent use of home exercise program to facilitate ability to maintain/progress functional gains from skilled physical therapy services.    Time 10    Period Weeks    Status New    Target Date 01/12/21      PT LONG TERM GOAL #3   Title Pt. will demonstrate ambulation c quad cane c mod independence community distances > 300 ft at gait speed > 2.6 ft for community ambulation.    Time 10    Period Weeks    Status New    Target Date 01/12/21      PT LONG TERM GOAL #4   Title Pt. will demonstrate bilateral hip flexion, knee flexion strength 5/5 to facilitate progressive mobility.    Time 10    Period Weeks    Status New    Target Date 01/12/21      PT LONG TERM GOAL #5   Title Pt. will demonstrate FOTO outcome > or = 63 %    Time 10    Period Weeks    Status New    Target Date 01/12/21      Additional Long Term Goals   Additional Long Term Goals Yes      PT LONG TERM GOAL #6   Title Pt. will demonstrate BERG > 45 to indicate reduced fall risk due to condition.    Time 10    Period Weeks    Status New    Target Date 01/12/21                   Plan - 11/22/20 1629     Clinical Impression Statement New shoes improved brace and patient interaction and alingment which probably contributed to improved static balance and ambulation.  Arrived c cane not walker today.  Continued difficulty in correction strategies resulting in focus on improving stepping strategy for functional loss of balance correction.    Personal Factors and Comorbidities Comorbidity 3+     Comorbidities Chronic kidney disease, CAD, diabetic neuropathy, GERD, Gout, HTN, hypothyroidism, history of prostate cancer, PVD    Dialysis, double upright brace on Rt and AFO on Lt    Stability/Clinical Decision Making Evolving/Moderate complexity    Rehab Potential --   fair to good   PT Frequency 2x / week    PT Duration Other (comment)   10 weeks   PT Treatment/Interventions ADLs/Self Care Home Management;Cryotherapy;Moist Heat;Balance training;Therapeutic exercise;Therapeutic activities;Functional mobility training;Stair training;Gait training;DME Instruction;Neuromuscular re-education;Patient/family education;Passive range of motion;Joint Manipulations;Splinting;Taping;Manual techniques;Orthotic Fit/Training    PT Next Visit Plan Progress endurance, improve static and dynamic balance tasks.    PT Home Exercise Plan [email or web address]    Consulted and Agree with Plan of Care Patient;Family member/caregiver             Patient will benefit from skilled therapeutic intervention in order to improve the following deficits and impairments:  Abnormal gait, Decreased endurance, Hypomobility, Decreased strength, Decreased activity tolerance, Decreased balance, Decreased mobility, Difficulty walking, Impaired perceived functional ability, Improper body mechanics, Postural dysfunction, Impaired flexibility, Decreased coordination, Decreased range of motion  Visit Diagnosis: Pain in left foot  Pain in right foot  Muscle weakness (generalized)  Difficulty in walking, not elsewhere classified  Abnormal posture     Problem List Patient Active Problem List   Diagnosis Date Noted   Symptomatic anemia 09/13/2020   COVID-19 virus infection 09/13/2020  Pressure injury of skin 06/29/2020   Gross hematuria 06/16/2020   Typical atrial flutter ([place]) 03/24/2020   Bilateral pleural effusion 02/24/2020   Healthcare maintenance 01/28/2020   Shortness of breath 01/28/2020   History of partial ray  amputation of fourth toe of right foot ([place]) 09/08/2019   Ulcerated, foot, right, with necrosis of bone (HCC)    Chronic cough 06/25/2019   Cutaneous abscess of right foot    Subacute osteomyelitis of right foot ([place]) 12/14/2018   AKI (acute kidney injury) ([place]) 12/14/2018   ESRD (end stage renal disease) ([place]) 12/14/2018   Gait abnormality 08/24/2018   Diabetic neuropathy ([place]) 02/06/2018   Cervical myelopathy ([place]) 02/06/2018   Onychomycosis 10/30/2017   Other spondylosis with radiculopathy, cervical region 01/27/2017   Midfoot ulcer, right, limited to breakdown of skin ([place]) 11/15/2016   Lateral epicondylitis, left elbow 08/12/2016   Cellulitis of fifth toe of right foot 07/29/2016   Prostate cancer ([place]) 06/19/2016   Non-pressure chronic ulcer of other part of right foot limited to breakdown of skin ([place]) 04/04/2016   Cellulitis of leg, right 12/24/2015   Cellulitis of right lower extremity 12/24/2015   Gout 09/14/2012   HTN (hypertension) 09/14/2012   Hypothyroidism 09/14/2012   CAD (coronary artery disease) 09/14/2012   Diabetes mellitus ([place]) 09/14/2012   Hypercholesteremia    Neuropathy ([place])     Scot Jun, PT, DPT, OCS, ATC 11/22/20  4:40 PM    [facility] [address] [place], [place], [address] Phone: [phone]   Fax:  [phone]  Name: [name] MRN: [id number] Date of Birth: Feb 25, 1951

## 2020-11-24 ENCOUNTER — Other Ambulatory Visit: Payer: Self-pay

## 2020-11-24 ENCOUNTER — Encounter: Payer: Self-pay | Admitting: Rehabilitative and Restorative Service Providers"

## 2020-11-24 ENCOUNTER — Ambulatory Visit (INDEPENDENT_AMBULATORY_CARE_PROVIDER_SITE_OTHER): Payer: Medicare Other | Admitting: Rehabilitative and Restorative Service Providers"

## 2020-11-24 DIAGNOSIS — M79672 Pain in left foot: Secondary | ICD-10-CM

## 2020-11-24 DIAGNOSIS — M6281 Muscle weakness (generalized): Secondary | ICD-10-CM | POA: Diagnosis not present

## 2020-11-24 DIAGNOSIS — R293 Abnormal posture: Secondary | ICD-10-CM

## 2020-11-24 DIAGNOSIS — R262 Difficulty in walking, not elsewhere classified: Secondary | ICD-10-CM

## 2020-11-24 DIAGNOSIS — M79671 Pain in right foot: Secondary | ICD-10-CM

## 2020-11-24 NOTE — Therapy (Signed)
[name] [address] [place], [place], [address] Phone: [phone]   Fax:  [phone]  Physical Therapy Treatment  Patient Details  Name: [name] MRN: [id number] Date of Birth: 05/28/1950 Referring Provider (PT): Dondra Prader NP   Encounter Date: 11/24/2020   PT End of Session - 11/24/20 1133     Visit Number 7    Number of Visits 20    Date for PT Re-Evaluation 01/12/21    Authorization Type Medicare, BCBS    Progress Note Due on Visit 10    PT Start Time 1045    PT Stop Time 1130    PT Time Calculation (min) 45 min    Equipment Utilized During Treatment Gait belt    Activity Tolerance Patient tolerated treatment well    Behavior During Therapy [place] for tasks assessed/performed             Past Medical History:  Diagnosis Date   Ambulates with cane    straight cane   Cervical myelopathy ([place]) 02/06/2018   Chronic kidney disease    dailysis M W F- home   Complication of anesthesia    Coronary artery disease    Diabetes mellitus without complication ([place])    type2   Diabetic foot ulcer ([place])    Diabetic neuropathy ([place]) 02/06/2018   Gait abnormality 08/24/2018   GERD (gastroesophageal reflux disease)     01/06/20- not current   Gout    History of kidney stones    passed stones   Hypercholesteremia    Hypertension    Hypothyroidism    Neuromuscular disorder (HCC)    neuropathy left leg and bilateral feet   Neuropathy    PONV (postoperative nausea and vomiting)    Prostate cancer ([place])    PVD (peripheral vascular disease) ([place])    with amputations    Past Surgical History:  Procedure Laterality Date   A-FLUTTER ABLATION N/A 04/06/2020   Procedure: A-FLUTTER ABLATION;  Surgeon: [name], MD;  Location: [place] [facility];  Service: Cardiovascular;  Laterality: N/A;   AMPUTATION Left 12/25/2013   Procedure: AMPUTATION RAY LEFT 5TH RAY;  Surgeon: [name], MD;  [facility];  Service: Orthopedics;   Laterality: Left;   AMPUTATION Right 12/15/2018   Procedure: AMPUTATION OF 4TH AND 5TH TOES RIGHT FOOT;  Surgeon: [name], MD;  Location: [place];  Service: Orthopedics;  Laterality: Right;   APPLICATION OF WOUND VAC Right 12/15/2018   Procedure: APPLICATION OF WOUND VAC;  Surgeon: [name], MD;  Location: [place];  Service: Orthopedics;  Laterality: Right;   AV FISTULA PLACEMENT Left 03/25/2019   Procedure: LEFT ARM ARTERIOVENOUS (AV) FISTULA CREATION;  Surgeon: [name], MD;  Location: [place] [facility];  Service: Vascular;  Laterality: Left;   [place] Left 05/20/2019   Procedure: SECOND STAGE LEFT BASCILIC VEIN TRANSPOSITION;  Surgeon: [name], MD;  Location: [place] [facility];  Service: Vascular;  Laterality: Left;   CARDIAC CATHETERIZATION  02/17/2014   CHOLECYSTECTOMY     COLONOSCOPY  2011   in [place], [place]  2008   CYSTOSCOPY N/A 06/29/2020   Procedure: [place];  Surgeon: [name], MD;  Location: [place];  Service: Urology;  Laterality: N/A;   CYSTOSCOPY WITH FULGERATION Bilateral 06/17/2020   Procedure: CYSTOSCOPY,BILATERAL RETROGRADE, CLOT EVACUATION WITH FULGERATION OF THE BLADDER;  Surgeon: Abner Greenspan,  Bonna Gains, MD;  Location: [place];  Service: Urology;  Laterality: Bilateral;   CYSTOSCOPY WITH FULGERATION N/A 06/28/2020   Procedure: CYSTOSCOPY WITH CLOT EVACUATION AND FULGERATION OF BLEEDERS;  Surgeon: [name], MD;  Location: [place];  Service: Urology;  Laterality: N/A;  1 HR   I & D EXTREMITY Right 12/15/2018   Procedure: DEBRIDEMENT RIGHT FOOT;  Surgeon: [name], MD;  Location: [place];  Service: Orthopedics;  Laterality: Right;   I & D EXTREMITY Right 08/27/2019   Procedure: PARTIAL CUBOID EXCISION RIGHT FOOT;  Surgeon: [name], MD;  Location: [place];  Service: Orthopedics;  Laterality: Right;   I & D EXTREMITY Right 01/07/2020   Procedure: RIGHT FOOT  EXCISION INFECTED BONE;  Surgeon: [name], MD;  Location: [place];  Service: Orthopedics;  Laterality: Right;   NECK SURGERY     novemver 2019   TRANSURETHRAL RESECTION OF PROSTATE N/A 06/28/2020   Procedure: TRANSURETHRAL RESECTION OF THE PROSTATE (TURP);  Surgeon: [name], MD;  Location: [place];  Service: Urology;  Laterality: N/A;   WISDOM TOOTH EXTRACTION      There were no vitals filed for this visit.   Subjective Assessment - 11/24/20 1130     Subjective Pt. stated foot stays the same.  Drove today and using cane to enter building    Patient is accompained by: Family member    Pertinent History Chronic kidney disease, CAD, diabetic neuropathy, GERD, Gout, HTN, hypothyroidism, history of prostate cancer, PVD    Dialysis, double upright brace on Rt and AFO on Lt    Limitations Standing;Walking;House hold activities    Patient Stated Goals Get to using cane    Currently in Pain? Yes    Pain Score 5     Pain Location Foot    Pain Orientation Right    Pain Descriptors / Indicators Stabbing    Pain Type Chronic pain    Pain Onset More than a month ago    Pain Frequency Constant    Aggravating Factors  worse at night    Pain Relieving Factors nothing specific for nighttime.                               [place] Adult PT Treatment/Exercise - 11/24/20 0001       Therapeutic Activites    Therapeutic Activities Other Therapeutic Activities    Other Therapeutic Activities Up/down ramp x 2 c CGA, step up 4 inch c bilateral rail assist c SBA x 10 bilateral, curb descent x 1 c cues for sequecing of cane/LE      Neuro Re-ed    Neuro Re-ed Details  BIG inspired stepping fwd x 10 bilateral, side stepping in bars bilateral UE assist 10 ft x 5 each way, feet together stance EC 1 min x 2 c min A corrections of balance a few times      Ankle Exercises: Aerobic   Nustep Lvl 6 12 mins                      PT Short Term Goals - 11/24/20 1131        PT SHORT TERM GOAL #1   Title Patient will demonstrate independent use of home exercise program to maintain progress from in clinic treatments.    Time 3    Period Weeks    Status Achieved    Target Date 11/24/20  PT Long Term Goals - 11/24/20 1133       PT LONG TERM GOAL #1   Title Patient will demonstrate/report pain at worst less than or equal to 2/10 to facilitate minimal limitation in daily activity secondary to pain symptoms.    Time 10    Period Weeks    Status On-going    Target Date 01/12/21      PT LONG TERM GOAL #2   Title Patient will demonstrate independent use of home exercise program to facilitate ability to maintain/progress functional gains from skilled physical therapy services.    Time 10    Period Weeks    Status On-going    Target Date 01/12/21      PT LONG TERM GOAL #3   Title Pt. will demonstrate ambulation c quad cane c mod independence community distances > 300 ft at gait speed > 2.6 ft for community ambulation.    Time 10    Period Weeks    Status On-going    Target Date 01/12/21      PT LONG TERM GOAL #4   Title Pt. will demonstrate bilateral hip flexion, knee flexion strength 5/5 to facilitate progressive mobility.    Time 10    Period Weeks    Status On-going    Target Date 01/12/21      PT LONG TERM GOAL #5   Title Pt. will demonstrate FOTO outcome > or = 63 %    Time 10    Period Weeks    Status On-going    Target Date 01/12/21      PT LONG TERM GOAL #6   Title Pt. will demonstrate BERG > 45 to indicate reduced fall risk due to condition.    Time 10    Period Weeks    Status On-going    Target Date 01/12/21                   Plan - 11/24/20 1131     Clinical Impression Statement Pt. is secure with cane approx. 80 percent of steps with occasional lateral movement of instability on Lt leg that resulted in stepping and occasional assist from clinician in clinic.  Spent time on step up and ramp for  house/community navigation c good techniques following cues but not stable enough to perform above SBA.    Personal Factors and Comorbidities Comorbidity 3+    Comorbidities Chronic kidney disease, CAD, diabetic neuropathy, GERD, Gout, HTN, hypothyroidism, history of prostate cancer, PVD    Dialysis, double upright brace on Rt and AFO on Lt    Stability/Clinical Decision Making Evolving/Moderate complexity    Rehab Potential --   fair to good   PT Frequency 2x / week    PT Duration Other (comment)   10 weeks   PT Treatment/Interventions ADLs/Self Care Home Management;Cryotherapy;Moist Heat;Balance training;Therapeutic exercise;Therapeutic activities;Functional mobility training;Stair training;Gait training;DME Instruction;Neuromuscular re-education;Patient/family education;Passive range of motion;Joint Manipulations;Splinting;Taping;Manual techniques;Orthotic Fit/Training    PT Next Visit Plan Continue ramp/curb training, stairs for moving going, general balance and stepping response improvements.    PT Home Exercise Plan [email or web address]    Consulted and Agree with Plan of Care Patient             Patient will benefit from skilled therapeutic intervention in order to improve the following deficits and impairments:  Abnormal gait, Decreased endurance, Hypomobility, Decreased strength, Decreased activity tolerance, Decreased balance, Decreased mobility, Difficulty walking, Impaired perceived functional ability, Improper body mechanics, Postural dysfunction, Impaired flexibility,  Decreased coordination, Decreased range of motion  Visit Diagnosis: Pain in left foot  Pain in right foot  Muscle weakness (generalized)  Difficulty in walking, not elsewhere classified  Abnormal posture     Problem List Patient Active Problem List   Diagnosis Date Noted   Symptomatic anemia 09/13/2020   COVID-19 virus infection 09/13/2020   Pressure injury of skin 06/29/2020   Gross hematuria 06/16/2020    Typical atrial flutter ([place]) 03/24/2020   Bilateral pleural effusion 02/24/2020   Healthcare maintenance 01/28/2020   Shortness of breath 01/28/2020   History of partial ray amputation of fourth toe of right foot ([place]) 09/08/2019   Ulcerated, foot, right, with necrosis of bone ([place])    Chronic cough 06/25/2019   Cutaneous abscess of right foot    Subacute osteomyelitis of right foot ([place]) 12/14/2018   AKI (acute kidney injury) ([place]) 12/14/2018   ESRD (end stage renal disease) ([place]) 12/14/2018   Gait abnormality 08/24/2018   Diabetic neuropathy ([place]) 02/06/2018   Cervical myelopathy ([place]) 02/06/2018   Onychomycosis 10/30/2017   Other spondylosis with radiculopathy, cervical region 01/27/2017   Midfoot ulcer, right, limited to breakdown of skin ([place]) 11/15/2016   Lateral epicondylitis, left elbow 08/12/2016   Cellulitis of fifth toe of right foot 07/29/2016   Prostate cancer ([place]) 06/19/2016   Non-pressure chronic ulcer of other part of right foot limited to breakdown of skin ([place]) 04/04/2016   Cellulitis of leg, right 12/24/2015   Cellulitis of right lower extremity 12/24/2015   Gout 09/14/2012   HTN (hypertension) 09/14/2012   Hypothyroidism 09/14/2012   CAD (coronary artery disease) 09/14/2012   Diabetes mellitus ([place]) 09/14/2012   Hypercholesteremia    Neuropathy ([place])     Scot Jun, PT, DPT, OCS, ATC 11/24/20  11:37 AM    [facility] [address] [place], [place], [address] Phone: [phone]   Fax:  [phone]  Name: Laker Thompson MRN: [id number] Date of Birth: September 26, 1950

## 2020-11-29 ENCOUNTER — Other Ambulatory Visit: Payer: Self-pay

## 2020-11-29 ENCOUNTER — Encounter: Payer: Self-pay | Admitting: Rehabilitative and Restorative Service Providers"

## 2020-11-29 ENCOUNTER — Ambulatory Visit (INDEPENDENT_AMBULATORY_CARE_PROVIDER_SITE_OTHER): Payer: Medicare Other | Admitting: Rehabilitative and Restorative Service Providers"

## 2020-11-29 DIAGNOSIS — M79672 Pain in left foot: Secondary | ICD-10-CM

## 2020-11-29 DIAGNOSIS — M6281 Muscle weakness (generalized): Secondary | ICD-10-CM

## 2020-11-29 DIAGNOSIS — M79671 Pain in right foot: Secondary | ICD-10-CM | POA: Diagnosis not present

## 2020-11-29 DIAGNOSIS — R262 Difficulty in walking, not elsewhere classified: Secondary | ICD-10-CM | POA: Diagnosis not present

## 2020-11-29 DIAGNOSIS — R293 Abnormal posture: Secondary | ICD-10-CM

## 2020-11-29 NOTE — Therapy (Signed)
[facility] [address] [place], [place], [address] Phone: [phone]   Fax:  [phone]  Physical Therapy Treatment  Patient Details  Name: [name] MRN: [id number] Date of Birth: [birth year] Referring Provider (PT): Dondra Prader NP   Encounter Date: 11/29/2020   PT End of Session - 11/29/20 1542     Visit Number 8    Number of Visits 20    Date for PT Re-Evaluation 01/12/21    Authorization Type Medicare, BCBS    Progress Note Due on Visit 10    PT Start Time 1510    PT Stop Time 1552    PT Time Calculation (min) 42 min    Equipment Utilized During Treatment Gait belt    Activity Tolerance Patient tolerated treatment well    Behavior During Therapy [facility] for tasks assessed/performed             Past Medical History:  Diagnosis Date   Ambulates with cane    straight cane   Cervical myelopathy ([place]) 02/06/2018   Chronic kidney disease    dailysis M W F- home   Complication of anesthesia    Coronary artery disease    Diabetes mellitus without complication ([place])    type2   Diabetic foot ulcer ([place])    Diabetic neuropathy ([place]) 02/06/2018   Gait abnormality 08/24/2018   GERD (gastroesophageal reflux disease)     01/06/20- not current   Gout    History of kidney stones    passed stones   Hypercholesteremia    Hypertension    Hypothyroidism    Neuromuscular disorder (HCC)    neuropathy left leg and bilateral feet   Neuropathy    PONV (postoperative nausea and vomiting)    Prostate cancer ([place])    PVD (peripheral vascular disease) ([place])    with amputations    Past Surgical History:  Procedure Laterality Date   A-FLUTTER ABLATION N/A 04/06/2020   Procedure: A-FLUTTER ABLATION;  Surgeon: [name], MD;  Location: [place] [facility];  Service: Cardiovascular;  Laterality: N/A;   AMPUTATION Left 12/25/2013   Procedure: AMPUTATION RAY LEFT 5TH RAY;  Surgeon: [name], MD;  [facility];  Service: Orthopedics;   Laterality: Left;   AMPUTATION Right 12/15/2018   Procedure: AMPUTATION OF 4TH AND 5TH TOES RIGHT FOOT;  Surgeon: [name], MD;  Location: [place];  Service: Orthopedics;  Laterality: Right;   APPLICATION OF WOUND VAC Right 12/15/2018   Procedure: APPLICATION OF WOUND VAC;  Surgeon: [name], MD;  Location: [place];  Service: Orthopedics;  Laterality: Right;   AV FISTULA PLACEMENT Left 03/25/2019   Procedure: LEFT ARM ARTERIOVENOUS (AV) FISTULA CREATION;  Surgeon: [name], MD;  Location: [place] [facility];  Service: Vascular;  Laterality: Left;   [place] Left 05/20/2019   Procedure: SECOND STAGE LEFT BASCILIC VEIN TRANSPOSITION;  Surgeon: [name], MD;  Location: [place] [facility];  Service: Vascular;  Laterality: Left;   CARDIAC CATHETERIZATION  02/17/2014   CHOLECYSTECTOMY     COLONOSCOPY  2011   in [place], [place]  2008   CYSTOSCOPY N/A 06/29/2020   Procedure: [place];  Surgeon: [name], MD;  Location: [place];  Service: Urology;  Laterality: N/A;   CYSTOSCOPY WITH FULGERATION Bilateral 06/17/2020   Procedure: CYSTOSCOPY,BILATERAL RETROGRADE, CLOT EVACUATION WITH FULGERATION OF THE BLADDER;  Surgeon: Abner Greenspan,  Bonna Gains, MD;  Location: [place];  Service: Urology;  Laterality: Bilateral;   CYSTOSCOPY WITH FULGERATION N/A 06/28/2020   Procedure: CYSTOSCOPY WITH CLOT EVACUATION AND FULGERATION OF BLEEDERS;  Surgeon: [name], MD;  Location: [place];  Service: Urology;  Laterality: N/A;  1 HR   I & D EXTREMITY Right 12/15/2018   Procedure: DEBRIDEMENT RIGHT FOOT;  Surgeon: [name], MD;  Location: [place];  Service: Orthopedics;  Laterality: Right;   I & D EXTREMITY Right 08/27/2019   Procedure: PARTIAL CUBOID EXCISION RIGHT FOOT;  Surgeon: [name], MD;  Location: [place];  Service: Orthopedics;  Laterality: Right;   I & D EXTREMITY Right 01/07/2020   Procedure: RIGHT FOOT  EXCISION INFECTED BONE;  Surgeon: [name], MD;  Location: [place];  Service: Orthopedics;  Laterality: Right;   NECK SURGERY     novemver 2019   TRANSURETHRAL RESECTION OF PROSTATE N/A 06/28/2020   Procedure: TRANSURETHRAL RESECTION OF THE PROSTATE (TURP);  Surgeon: [name], MD;  Location: [place];  Service: Urology;  Laterality: N/A;   WISDOM TOOTH EXTRACTION      There were no vitals filed for this visit.   Subjective Assessment - 11/29/20 1515     Subjective Pt. indicated foot was "a bear " today.  Nothing more specific reported.  Drove again today and entered alone c cane.    Patient is accompained by: Family member    Pertinent History Chronic kidney disease, CAD, diabetic neuropathy, GERD, Gout, HTN, hypothyroidism, history of prostate cancer, PVD    Dialysis, double upright brace on Rt and AFO on Lt    Limitations Standing;Walking;House hold activities    Patient Stated Goals Get to using cane    Currently in Pain? Yes    Pain Score --   a bear   Pain Location Foot    Pain Orientation Right    Pain Descriptors / Indicators Stabbing    Pain Onset More than a month ago    Pain Frequency Constant    Aggravating Factors  consistent during day usually    Pain Relieving Factors nothing specific                               [facility] Adult PT Treatment/Exercise - 11/29/20 0001       Therapeutic Activites    Other Therapeutic Activities Up/down ramp c CGA to min A x 4 each way, step up 6 inch c bilateral hand assist      Neuro Re-ed    Neuro Re-ed Details  Anterior weight shift hip strategy off clinician x 15, BIG inspired retro step x 10 bilateral one UE, feet together stance on foam 60 seconds      Ankle Exercises: Machines for Strengthening   Cybex Leg Press 100 lbs double leg 2 x 15      Ankle Exercises: Aerobic   Nustep Lvl 6 10 mins                      PT Short Term Goals - 11/24/20 1131       PT SHORT TERM GOAL #1    Title Patient will demonstrate independent use of home exercise program to maintain progress from in clinic treatments.    Time 3    Period Weeks    Status Achieved    Target Date 11/24/20  PT Long Term Goals - 11/24/20 1133       PT LONG TERM GOAL #1   Title Patient will demonstrate/report pain at worst less than or equal to 2/10 to facilitate minimal limitation in daily activity secondary to pain symptoms.    Time 10    Period Weeks    Status On-going    Target Date 01/12/21      PT LONG TERM GOAL #2   Title Patient will demonstrate independent use of home exercise program to facilitate ability to maintain/progress functional gains from skilled physical therapy services.    Time 10    Period Weeks    Status On-going    Target Date 01/12/21      PT LONG TERM GOAL #3   Title Pt. will demonstrate ambulation c quad cane c mod independence community distances > 300 ft at gait speed > 2.6 ft for community ambulation.    Time 10    Period Weeks    Status On-going    Target Date 01/12/21      PT LONG TERM GOAL #4   Title Pt. will demonstrate bilateral hip flexion, knee flexion strength 5/5 to facilitate progressive mobility.    Time 10    Period Weeks    Status On-going    Target Date 01/12/21      PT LONG TERM GOAL #5   Title Pt. will demonstrate FOTO outcome > or = 63 %    Time 10    Period Weeks    Status On-going    Target Date 01/12/21      PT LONG TERM GOAL #6   Title Pt. will demonstrate BERG > 45 to indicate reduced fall risk due to condition.    Time 10    Period Weeks    Status On-going    Target Date 01/12/21                   Plan - 11/29/20 1543     Clinical Impression Statement Increasing control in functional mobility such as stairs and ramp, ambulation.  Still showing necessity for continued skilled PT services c clinician guarding in movement due to fall risk.    Personal Factors and Comorbidities Comorbidity 3+     Comorbidities Chronic kidney disease, CAD, diabetic neuropathy, GERD, Gout, HTN, hypothyroidism, history of prostate cancer, PVD    Dialysis, double upright brace on Rt and AFO on Lt    Stability/Clinical Decision Making Evolving/Moderate complexity    Rehab Potential --   fair to good   PT Frequency 2x / week    PT Duration Other (comment)   10 weeks   PT Treatment/Interventions ADLs/Self Care Home Management;Cryotherapy;Moist Heat;Balance training;Therapeutic exercise;Therapeutic activities;Functional mobility training;Stair training;Gait training;DME Instruction;Neuromuscular re-education;Patient/family education;Passive range of motion;Joint Manipulations;Splinting;Taping;Manual techniques;Orthotic Fit/Training    PT Next Visit Plan ramp/curb training, stairs for moving going, general balance and stepping response improvements.    PT Home Exercise Plan [email or web address]    Consulted and Agree with Plan of Care Patient             Patient will benefit from skilled therapeutic intervention in order to improve the following deficits and impairments:  Abnormal gait, Decreased endurance, Hypomobility, Decreased strength, Decreased activity tolerance, Decreased balance, Decreased mobility, Difficulty walking, Impaired perceived functional ability, Improper body mechanics, Postural dysfunction, Impaired flexibility, Decreased coordination, Decreased range of motion  Visit Diagnosis: Pain in left foot  Pain in right foot  Muscle weakness (generalized)  Difficulty in walking,  not elsewhere classified  Abnormal posture     Problem List Patient Active Problem List   Diagnosis Date Noted   Symptomatic anemia 09/13/2020   COVID-19 virus infection 09/13/2020   Pressure injury of skin 06/29/2020   Gross hematuria 06/16/2020   Typical atrial flutter ([place]) 03/24/2020   Bilateral pleural effusion 02/24/2020   Healthcare maintenance 01/28/2020   Shortness of breath 01/28/2020   History of partial  ray amputation of fourth toe of right foot ([place]) 09/08/2019   Ulcerated, foot, right, with necrosis of bone ([name])    Chronic cough 06/25/2019   Cutaneous abscess of right foot    Subacute osteomyelitis of right foot ([place]) 12/14/2018   AKI (acute kidney injury) ([place]) 12/14/2018   ESRD (end stage renal disease) ([place]) 12/14/2018   Gait abnormality 08/24/2018   Diabetic neuropathy ([place]) 02/06/2018   Cervical myelopathy ([place]) 02/06/2018   Onychomycosis 10/30/2017   Other spondylosis with radiculopathy, cervical region 01/27/2017   Midfoot ulcer, right, limited to breakdown of skin ([place]) 11/15/2016   Lateral epicondylitis, left elbow 08/12/2016   Cellulitis of fifth toe of right foot 07/29/2016   Prostate cancer ([place]) 06/19/2016   Non-pressure chronic ulcer of other part of right foot limited to breakdown of skin ([place]) 04/04/2016   Cellulitis of leg, right 12/24/2015   Cellulitis of right lower extremity 12/24/2015   Gout 09/14/2012   HTN (hypertension) 09/14/2012   Hypothyroidism 09/14/2012   CAD (coronary artery disease) 09/14/2012   Diabetes mellitus ([place]) 09/14/2012   Hypercholesteremia    Neuropathy ([place])     Scot Jun, PT, DPT, OCS, ATC 11/29/20  3:46 PM    [facility] [address] [place], [place], [address] Phone: [phone]   Fax:  [phone]  Name: [name] MRN: [id number] Date of Birth: [birth year]

## 2020-12-01 ENCOUNTER — Encounter: Payer: Self-pay | Admitting: Rehabilitative and Restorative Service Providers"

## 2020-12-01 ENCOUNTER — Other Ambulatory Visit: Payer: Self-pay

## 2020-12-01 ENCOUNTER — Ambulatory Visit (INDEPENDENT_AMBULATORY_CARE_PROVIDER_SITE_OTHER): Payer: Medicare Other | Admitting: Rehabilitative and Restorative Service Providers"

## 2020-12-01 DIAGNOSIS — M6281 Muscle weakness (generalized): Secondary | ICD-10-CM | POA: Diagnosis not present

## 2020-12-01 DIAGNOSIS — R293 Abnormal posture: Secondary | ICD-10-CM

## 2020-12-01 DIAGNOSIS — M79672 Pain in left foot: Secondary | ICD-10-CM | POA: Diagnosis not present

## 2020-12-01 DIAGNOSIS — R262 Difficulty in walking, not elsewhere classified: Secondary | ICD-10-CM | POA: Diagnosis not present

## 2020-12-01 DIAGNOSIS — M79671 Pain in right foot: Secondary | ICD-10-CM

## 2020-12-01 NOTE — Therapy (Signed)
[facility] [address] [place], [place], [address] Phone: [phone]   Fax:  [phone]  Physical Therapy Treatment  Patient Details  Name: [name] MRN: [id number] Date of Birth: June 10, 1950 Referring Provider (PT): Dondra Prader NP   Encounter Date: 12/01/2020   PT End of Session - 12/01/20 1015     Visit Number 9    Number of Visits 20    Date for PT Re-Evaluation 01/12/21    Authorization Type Medicare, BCBS    Progress Note Due on Visit 10    PT Start Time 1010    PT Stop Time 1049    PT Time Calculation (min) 39 min    Equipment Utilized During Treatment Gait belt    Activity Tolerance Patient tolerated treatment well    Behavior During Therapy [facility] for tasks assessed/performed             Past Medical History:  Diagnosis Date   Ambulates with cane    straight cane   Cervical myelopathy ([place]) 02/06/2018   Chronic kidney disease    dailysis M W F- home   Complication of anesthesia    Coronary artery disease    Diabetes mellitus without complication ([place])    type2   Diabetic foot ulcer ([place])    Diabetic neuropathy ([place]) 02/06/2018   Gait abnormality 08/24/2018   GERD (gastroesophageal reflux disease)     01/06/20- not current   Gout    History of kidney stones    passed stones   Hypercholesteremia    Hypertension    Hypothyroidism    Neuromuscular disorder (HCC)    neuropathy left leg and bilateral feet   Neuropathy    PONV (postoperative nausea and vomiting)    Prostate cancer ([place])    PVD (peripheral vascular disease) ([place])    with amputations    Past Surgical History:  Procedure Laterality Date   A-FLUTTER ABLATION N/A 04/06/2020   Procedure: A-FLUTTER ABLATION;  Surgeon: [name], MD;  Location: [place] [facility];  Service: Cardiovascular;  Laterality: N/A;   AMPUTATION Left 12/25/2013   Procedure: AMPUTATION RAY LEFT 5TH RAY;  Surgeon: [name], MD;  [facility];  Service: Orthopedics;   Laterality: Left;   AMPUTATION Right 12/15/2018   Procedure: AMPUTATION OF 4TH AND 5TH TOES RIGHT FOOT;  Surgeon: [name], MD;  Location: [place];  Service: Orthopedics;  Laterality: Right;   APPLICATION OF WOUND VAC Right 12/15/2018   Procedure: APPLICATION OF WOUND VAC;  Surgeon: [name], MD;  Location: [place];  Service: Orthopedics;  Laterality: Right;   AV FISTULA PLACEMENT Left 03/25/2019   Procedure: LEFT ARM ARTERIOVENOUS (AV) FISTULA CREATION;  Surgeon: [name], MD;  Location: [place] [facility];  Service: Vascular;  Laterality: Left;   [place] Left 05/20/2019   Procedure: SECOND STAGE LEFT BASCILIC VEIN TRANSPOSITION;  Surgeon: [name], MD;  Location: [place] [facility];  Service: Vascular;  Laterality: Left;   CARDIAC CATHETERIZATION  02/17/2014   CHOLECYSTECTOMY     COLONOSCOPY  2011   in [place], [name]  2008   CYSTOSCOPY N/A 06/29/2020   Procedure: [place];  Surgeon: [name], MD;  Location: [place];  Service: Urology;  Laterality: N/A;   CYSTOSCOPY WITH FULGERATION Bilateral 06/17/2020   Procedure: CYSTOSCOPY,BILATERAL RETROGRADE, CLOT EVACUATION WITH FULGERATION OF THE BLADDER;  Surgeon: Abner Greenspan,  Bonna Gains, MD;  Location: [place];  Service: Urology;  Laterality: Bilateral;   CYSTOSCOPY WITH FULGERATION N/A 06/28/2020   Procedure: CYSTOSCOPY WITH CLOT EVACUATION AND FULGERATION OF BLEEDERS;  Surgeon: [name], MD;  Location: [place];  Service: Urology;  Laterality: N/A;  1 HR   I & D EXTREMITY Right 12/15/2018   Procedure: DEBRIDEMENT RIGHT FOOT;  Surgeon: [name], MD;  Location: [place];  Service: Orthopedics;  Laterality: Right;   I & D EXTREMITY Right 08/27/2019   Procedure: PARTIAL CUBOID EXCISION RIGHT FOOT;  Surgeon: [name], MD;  Location: [place];  Service: Orthopedics;  Laterality: Right;   I & D EXTREMITY Right 01/07/2020   Procedure: RIGHT FOOT  EXCISION INFECTED BONE;  Surgeon: [name], MD;  Location: [place];  Service: Orthopedics;  Laterality: Right;   NECK SURGERY     novemver 2019   TRANSURETHRAL RESECTION OF PROSTATE N/A 06/28/2020   Procedure: TRANSURETHRAL RESECTION OF THE PROSTATE (TURP);  Surgeon: [name], MD;  Location: [place];  Service: Urology;  Laterality: N/A;   WISDOM TOOTH EXTRACTION      There were no vitals filed for this visit.   Subjective Assessment - 12/01/20 1014     Subjective Pt. stated foot a little better today.  Felt a bit tired today.  Did drive today and indicated he is driving more.    Patient is accompained by: Family member    Pertinent History Chronic kidney disease, CAD, diabetic neuropathy, GERD, Gout, HTN, hypothyroidism, history of prostate cancer, PVD    Dialysis, double upright brace on Rt and AFO on Lt    Limitations Standing;Walking;House hold activities    Patient Stated Goals Get to using cane    Currently in Pain? Yes    Pain Score 3     Pain Location Foot    Pain Orientation Right    Pain Descriptors / Indicators Stabbing    Pain Type Chronic pain    Pain Onset More than a month ago    Pain Frequency Constant    Aggravating Factors  insidious    Pain Relieving Factors insidious                               [facility] Adult PT Treatment/Exercise - 12/01/20 0001       Ambulation/Gait   Ambulation Distance (Feet) 225 Feet    Gait Comments SBA c quad cane from inside clinic to car with level floor, ramp to parking lot.      Therapeutic Activites    Other Therapeutic Activities up/down ramp and cub 6 inch x 4 each way c CGA c occasional min A to prevent loss of balance      Neuro Re-ed    Neuro Re-ed Details  Anterior weight shift hip strategy off clinician x 15, SPC use alt tapping on 6 inch step x 12 bilateral c CGA/min A at times, modified tandem stance 1 min x 2 bilateral      Ankle Exercises: Machines for Strengthening   Cybex Leg Press  100 lbs double leg 3 x 10      Ankle Exercises: Aerobic   Nustep Lvl 6 10 mins                      PT Short Term Goals - 11/24/20 1131       PT SHORT TERM GOAL #1  Title Patient will demonstrate independent use of home exercise program to maintain progress from in clinic treatments.    Time 3    Period Weeks    Status Achieved    Target Date 11/24/20               PT Long Term Goals - 11/24/20 1133       PT LONG TERM GOAL #1   Title Patient will demonstrate/report pain at worst less than or equal to 2/10 to facilitate minimal limitation in daily activity secondary to pain symptoms.    Time 10    Period Weeks    Status On-going    Target Date 01/12/21      PT LONG TERM GOAL #2   Title Patient will demonstrate independent use of home exercise program to facilitate ability to maintain/progress functional gains from skilled physical therapy services.    Time 10    Period Weeks    Status On-going    Target Date 01/12/21      PT LONG TERM GOAL #3   Title Pt. will demonstrate ambulation c quad cane c mod independence community distances > 300 ft at gait speed > 2.6 ft for community ambulation.    Time 10    Period Weeks    Status On-going    Target Date 01/12/21      PT LONG TERM GOAL #4   Title Pt. will demonstrate bilateral hip flexion, knee flexion strength 5/5 to facilitate progressive mobility.    Time 10    Period Weeks    Status On-going    Target Date 01/12/21      PT LONG TERM GOAL #5   Title Pt. will demonstrate FOTO outcome > or = 63 %    Time 10    Period Weeks    Status On-going    Target Date 01/12/21      PT LONG TERM GOAL #6   Title Pt. will demonstrate BERG > 45 to indicate reduced fall risk due to condition.    Time 10    Period Weeks    Status On-going    Target Date 01/12/21                   Plan - 12/01/20 1041     Clinical Impression Statement Pt. may continue to benefit from LE strengthening and balance  improvements to improve stability and reduce fall risk in ambulation.   Making overall steady gains but does show inconsistencies in stability in ambulation c quad cane at this time.    Personal Factors and Comorbidities Comorbidity 3+    Comorbidities Chronic kidney disease, CAD, diabetic neuropathy, GERD, Gout, HTN, hypothyroidism, history of prostate cancer, PVD    Dialysis, double upright brace on Rt and AFO on Lt    Stability/Clinical Decision Making Evolving/Moderate complexity    Rehab Potential --   fair to good   PT Frequency 2x / week    PT Duration Other (comment)   10 weeks   PT Treatment/Interventions ADLs/Self Care Home Management;Cryotherapy;Moist Heat;Balance training;Therapeutic exercise;Therapeutic activities;Functional mobility training;Stair training;Gait training;DME Instruction;Neuromuscular re-education;Patient/family education;Passive range of motion;Joint Manipulations;Splinting;Taping;Manual techniques;Orthotic Fit/Training    PT Next Visit Plan continued ramp/curb training, stairs for moving going, general balance and stepping response improvements.    PT Home Exercise Plan [email or web address]    Consulted and Agree with Plan of Care Patient             Patient will benefit from skilled therapeutic  intervention in order to improve the following deficits and impairments:  Abnormal gait, Decreased endurance, Hypomobility, Decreased strength, Decreased activity tolerance, Decreased balance, Decreased mobility, Difficulty walking, Impaired perceived functional ability, Improper body mechanics, Postural dysfunction, Impaired flexibility, Decreased coordination, Decreased range of motion  Visit Diagnosis: Pain in left foot  Pain in right foot  Muscle weakness (generalized)  Difficulty in walking, not elsewhere classified  Abnormal posture     Problem List Patient Active Problem List   Diagnosis Date Noted   Symptomatic anemia 09/13/2020   COVID-19 virus infection  09/13/2020   Pressure injury of skin 06/29/2020   Gross hematuria 06/16/2020   Typical atrial flutter ([place]) 03/24/2020   Bilateral pleural effusion 02/24/2020   Healthcare maintenance 01/28/2020   Shortness of breath 01/28/2020   History of partial ray amputation of fourth toe of right foot ([place]) 09/08/2019   Ulcerated, foot, right, with necrosis of bone (HCC)    Chronic cough 06/25/2019   Cutaneous abscess of right foot    Subacute osteomyelitis of right foot ([place]) 12/14/2018   AKI (acute kidney injury) ([place]) 12/14/2018   ESRD (end stage renal disease) ([place]) 12/14/2018   Gait abnormality 08/24/2018   Diabetic neuropathy ([place]) 02/06/2018   Cervical myelopathy ([place]) 02/06/2018   Onychomycosis 10/30/2017   Other spondylosis with radiculopathy, cervical region 01/27/2017   Midfoot ulcer, right, limited to breakdown of skin ([place]) 11/15/2016   Lateral epicondylitis, left elbow 08/12/2016   Cellulitis of fifth toe of right foot 07/29/2016   Prostate cancer ([place]) 06/19/2016   Non-pressure chronic ulcer of other part of right foot limited to breakdown of skin ([place]) 04/04/2016   Cellulitis of leg, right 12/24/2015   Cellulitis of right lower extremity 12/24/2015   Gout 09/14/2012   HTN (hypertension) 09/14/2012   Hypothyroidism 09/14/2012   CAD (coronary artery disease) 09/14/2012   Diabetes mellitus ([place]) 09/14/2012   Hypercholesteremia    Neuropathy ([place])     Scot Jun, PT, DPT, OCS, ATC 12/01/20  10:50 AM    [facility] [address] [place] [address] [place], [place], [address] Phone: [phone]   Fax:  [phone]  Name: Roddie Riegler MRN: [id number] Date of Birth: 06/21/1950

## 2020-12-06 ENCOUNTER — Ambulatory Visit (INDEPENDENT_AMBULATORY_CARE_PROVIDER_SITE_OTHER): Payer: Medicare Other | Admitting: Rehabilitative and Restorative Service Providers"

## 2020-12-06 ENCOUNTER — Encounter: Payer: Self-pay | Admitting: Rehabilitative and Restorative Service Providers"

## 2020-12-06 ENCOUNTER — Other Ambulatory Visit: Payer: Self-pay

## 2020-12-06 DIAGNOSIS — R262 Difficulty in walking, not elsewhere classified: Secondary | ICD-10-CM | POA: Diagnosis not present

## 2020-12-06 DIAGNOSIS — M6281 Muscle weakness (generalized): Secondary | ICD-10-CM

## 2020-12-06 DIAGNOSIS — M79672 Pain in left foot: Secondary | ICD-10-CM

## 2020-12-06 DIAGNOSIS — R2681 Unsteadiness on feet: Secondary | ICD-10-CM

## 2020-12-06 DIAGNOSIS — M79671 Pain in right foot: Secondary | ICD-10-CM

## 2020-12-06 DIAGNOSIS — R293 Abnormal posture: Secondary | ICD-10-CM

## 2020-12-06 NOTE — Therapy (Signed)
[facility] Therapy [address] [place], [place], [address] Phone: [phone]   Fax:  [phone]  Physical Therapy Treatment/Progress Note  Patient Details  Name: [name] MRN: [id number] Date of Birth: [birth year] Referring Provider (PT): Dondra Prader NP   Encounter Date: 12/06/2020  Progress Note Reporting Period 11/03/2020 to 12/06/2020  See note below for Objective Data and Assessment of Progress/Goals.       PT End of Session - 12/06/20 1558     Visit Number 10    Number of Visits 20    Date for PT Re-Evaluation 01/12/21    Authorization Type Medicare, BCBS    Progress Note Due on Visit 10    PT Start Time 1275    PT Stop Time 1633    PT Time Calculation (min) 40 min    Equipment Utilized During Treatment Gait belt    Activity Tolerance Patient tolerated treatment well    Behavior During Therapy WFL for tasks assessed/performed             Past Medical History:  Diagnosis Date   Ambulates with cane    straight cane   Cervical myelopathy ([place]) 02/06/2018   Chronic kidney disease    dailysis [facility]- home   Complication of anesthesia    Coronary artery disease    Diabetes mellitus without complication ([place])    type2   Diabetic foot ulcer ([place])    Diabetic neuropathy ([place]) 02/06/2018   Gait abnormality 08/24/2018   GERD (gastroesophageal reflux disease)     01/06/20- not current   Gout    History of kidney stones    passed stones   Hypercholesteremia    Hypertension    Hypothyroidism    Neuromuscular disorder (HCC)    neuropathy left leg and bilateral feet   Neuropathy    PONV (postoperative nausea and vomiting)    Prostate cancer ([place])    PVD (peripheral vascular disease) ([place])    with amputations    Past Surgical History:  Procedure Laterality Date   A-FLUTTER ABLATION N/A 04/06/2020   Procedure: A-FLUTTER ABLATION;  Surgeon: [name], MD;  Location: [facility];  Service: Cardiovascular;  Laterality:  N/A;   AMPUTATION Left 12/25/2013   Procedure: AMPUTATION RAY LEFT 5TH RAY;  Surgeon: [name], MD;  Location: [facility];  Service: Orthopedics;  Laterality: Left;   AMPUTATION Right 12/15/2018   Procedure: AMPUTATION OF 4TH AND 5TH TOES RIGHT FOOT;  Surgeon: [name], MD;  Location: [facility];  Service: Orthopedics;  Laterality: Right;   APPLICATION OF WOUND VAC Right 12/15/2018   Procedure: APPLICATION OF WOUND VAC;  Surgeon: [name], MD;  Location: [facility];  Service: Orthopedics;  Laterality: Right;   AV FISTULA PLACEMENT Left 03/25/2019   Procedure: LEFT ARM ARTERIOVENOUS (AV) FISTULA CREATION;  Surgeon: [name], MD;  Location: [facility];  Service: Vascular;  Laterality: Left;   [place] Left 05/20/2019   Procedure: SECOND STAGE LEFT BASCILIC VEIN TRANSPOSITION;  Surgeon: [name], MD;  Location: [facility];  Service: Vascular;  Laterality: Left;   CARDIAC CATHETERIZATION  02/17/2014   CHOLECYSTECTOMY     COLONOSCOPY  2011   in [place], [place]  2008   CYSTOSCOPY N/A 06/29/2020   Procedure: [place];  Surgeon: [name], MD;  Location: [facility];  Service: Urology;  Laterality: N/A;   CYSTOSCOPY WITH FULGERATION Bilateral 06/17/2020   Procedure: CYSTOSCOPY,BILATERAL RETROGRADE, CLOT EVACUATION WITH FULGERATION OF THE BLADDER;  Surgeon: Janith Lima, MD;  Location: [facility];  Service: Urology;  Laterality: Bilateral;   CYSTOSCOPY WITH FULGERATION N/A 06/28/2020   Procedure: CYSTOSCOPY WITH CLOT EVACUATION AND FULGERATION OF BLEEDERS;  Surgeon: [name], MD;  Location: [facility];  Service: Urology;  Laterality: N/A;  1 HR   I & D EXTREMITY Right 12/15/2018   Procedure: DEBRIDEMENT RIGHT FOOT;  Surgeon: [name], MD;  Location: [facility];  Service: Orthopedics;  Laterality: Right;   I & D EXTREMITY Right 08/27/2019   Procedure: PARTIAL CUBOID EXCISION RIGHT FOOT;   Surgeon: [name], MD;  Location: [facility];  Service: Orthopedics;  Laterality: Right;   I & D EXTREMITY Right 01/07/2020   Procedure: RIGHT FOOT EXCISION INFECTED BONE;  Surgeon: [name], MD;  Location: [facility];  Service: Orthopedics;  Laterality: Right;   NECK SURGERY     novemver 2019   TRANSURETHRAL RESECTION OF PROSTATE N/A 06/28/2020   Procedure: TRANSURETHRAL RESECTION OF THE PROSTATE (TURP);  Surgeon: [name], MD;  Location: [facility];  Service: Urology;  Laterality: N/A;   WISDOM TOOTH EXTRACTION      There were no vitals filed for this visit.   Subjective Assessment - 12/06/20 1559     Subjective Pt. indicated +6 a great deal better on global rating of change.   Pt. indicated he felt legs are stronger and balance is "somewhat" better.  Long walks with cane are still troublesome    Patient is accompained by: Family member    Pertinent History Chronic kidney disease, CAD, diabetic neuropathy, GERD, Gout, HTN, hypothyroidism, history of prostate cancer, PVD    Dialysis, double upright brace on Rt and AFO on Lt    Limitations Standing;Walking;House hold activities    Patient Stated Goals Get to using cane    Currently in Pain? Yes    Pain Score 5     Pain Location Foot    Pain Orientation Right    Pain Descriptors / Indicators Numbness    Pain Type Chronic pain    Pain Onset More than a month ago    Pain Frequency Constant    Aggravating Factors  insidious    Pain Relieving Factors insidious                [facility] PT Assessment - 12/06/20 0001       Assessment   Medical Diagnosis Z89.421 (ICD-10-CM) - History of partial ray amputation of fourth toe of right foot ([place])    Referring Provider (PT) Dondra Prader NP      Observation/Other Assessments   Focus on Therapeutic Outcomes (FOTO)  updated 45%      Strength   Right Hip Flexion 5/5    Left Hip Flexion 4+/5    Right Knee Flexion 4/5    Left Knee Flexion 4/5      Ambulation/Gait   Gait Comments  SBA/supervision c quad cane in Rt hand community distances 300 ft      [employer]   Sit to Stand Able to stand  independently using hands    Standing Unsupported Able to stand 2 minutes with supervision    Sitting with Back Unsupported but Feet Supported on Floor or Stool Able to sit safely and securely 2 minutes    Stand to Sit Uses backs of legs against chair to control  descent    Transfers Able to transfer safely, definite need of hands    Standing Unsupported with Eyes Closed Able to stand 10 seconds with supervision    Standing Unsupported with Feet Together Needs help to attain position but able to stand for 30 seconds with feet together    From Standing, Reach Forward with Outstretched Arm Reaches forward but needs supervision    From Standing Position, Pick up Object from Floor Unable to try/needs assist to keep balance    From Standing Position, Turn to Look Behind Over each Shoulder Needs supervision when turning    Turn 360 Degrees Needs assistance while turning    Standing Unsupported, Alternately Place Feet on Step/Stool Able to complete >2 steps/needs minimal assist    Standing Unsupported, One Foot in Front Loses balance while stepping or standing    Standing on One Leg Unable to try or needs assist to prevent fall    Total Score 22                           [facility] Adult PT Treatment/Exercise - 12/06/20 0001       Neuro Re-ed    Neuro Re-ed Details  see OTAGO program page.  modified tandem stance 1 min x 1 bilateral c SBA/min A and hand touching for loss of balance correction.      Ankle Exercises: Aerobic   Nustep Lvl 6 12 mins UE/LE                 Balance Exercises - 12/06/20 0001       OTAGO PROGRAM   Head Movements Standing;5 reps   Lt and Rt, up and down with feet shoulder width, CGA to min A at times   Back Extension 5 reps;Standing   CGA to min A   Backwards Walking Support   in // bars c Rt hand assist 10 ft fwd/back x 4 each    Sideways Walking Assistive device   Rt hand on bar anterior in // bars 10 ft x 4 each way                PT Short Term Goals - 11/24/20 1131       PT SHORT TERM GOAL #1   Title Patient will demonstrate independent use of home exercise program to maintain progress from in clinic treatments.    Time 3    Period Weeks    Status Achieved    Target Date 11/24/20               PT Long Term Goals - 12/06/20 1627       PT LONG TERM GOAL #1   Title Patient will demonstrate/report pain at worst less than or equal to 2/10 to facilitate minimal limitation in daily activity secondary to pain symptoms.    Time 10    Period Weeks    Status On-going    Target Date 01/12/21      PT LONG TERM GOAL #2   Title Patient will demonstrate independent use of home exercise program to facilitate ability to maintain/progress functional gains from skilled physical therapy services.    Time 10    Period Weeks    Status On-going    Target Date 01/12/21      PT LONG TERM GOAL #3   Title Pt. will demonstrate ambulation c quad cane c mod independence community distances > 300 ft at gait speed >  2.6 ft for community ambulation.    Time 10    Period Weeks    Status On-going    Target Date 01/12/21      PT LONG TERM GOAL #4   Title Pt. will demonstrate bilateral hip flexion, knee flexion strength 5/5 to facilitate progressive mobility.    Time 10    Period Weeks    Status On-going    Target Date 01/12/21      PT LONG TERM GOAL #5   Title Pt. will demonstrate FOTO outcome > or = 63 %    Time 10    Period Weeks    Status On-going    Target Date 01/12/21      PT LONG TERM GOAL #6   Title Pt. will demonstrate BERG > 45 to indicate reduced fall risk due to condition.    Time 10    Period Weeks    Status On-going    Target Date 01/12/21                   Plan - 12/06/20 1601     Clinical Impression Statement Pt. has attended 10 visits overall during course of treatment,  reporting +6 on global rating of change.  See objective data for updated information.  Pt. has made gains as noted.  Ambulation to clinic and in clinic at this time c quad cane c SBA/supervision while in clinic on level surfaces.  Continued skilled PT services are warranted at this time to continue to address impairment and progress towards goals and reduced fall risk.    Personal Factors and Comorbidities Comorbidity 3+    Comorbidities Chronic kidney disease, CAD, diabetic neuropathy, GERD, Gout, HTN, hypothyroidism, history of prostate cancer, PVD    Dialysis, double upright brace on Rt and AFO on Lt    Stability/Clinical Decision Making Evolving/Moderate complexity    Rehab Potential --   fair to good   PT Frequency 2x / week    PT Duration Other (comment)   10 weeks   PT Treatment/Interventions ADLs/Self Care Home Management;Cryotherapy;Moist Heat;Balance training;Therapeutic exercise;Therapeutic activities;Functional mobility training;Stair training;Gait training;DME Instruction;Neuromuscular re-education;Patient/family education;Passive range of motion;Joint Manipulations;Splinting;Taping;Manual techniques;Orthotic Fit/Training    PT Next Visit Plan Continue to progress static and dynamic balance intervention and endurance.    PT Home Exercise Plan [email or web address]    Consulted and Agree with Plan of Care Patient             Patient will benefit from skilled therapeutic intervention in order to improve the following deficits and impairments:  Abnormal gait, Decreased endurance, Hypomobility, Decreased strength, Decreased activity tolerance, Decreased balance, Decreased mobility, Difficulty walking, Impaired perceived functional ability, Improper body mechanics, Postural dysfunction, Impaired flexibility, Decreased coordination, Decreased range of motion  Visit Diagnosis: Pain in left foot  Pain in right foot  Muscle weakness (generalized)  Difficulty in walking, not elsewhere  classified  Abnormal posture  Unsteadiness on feet     Problem List Patient Active Problem List   Diagnosis Date Noted   Symptomatic anemia 09/13/2020   COVID-19 virus infection 09/13/2020   Pressure injury of skin 06/29/2020   Gross hematuria 06/16/2020   Typical atrial flutter ([place]) 03/24/2020   Bilateral pleural effusion 02/24/2020   Healthcare maintenance 01/28/2020   Shortness of breath 01/28/2020   History of partial ray amputation of fourth toe of right foot ([place]) 09/08/2019   Ulcerated, foot, right, with necrosis of bone (H[facility])    Chronic cough 06/25/2019   Cutaneous abscess  of right foot    Subacute osteomyelitis of right foot ([place]) 12/14/2018   AKI (acute kidney injury) ([place]) 12/14/2018   ESRD (end stage renal disease) ([place]) 12/14/2018   Gait abnormality 08/24/2018   Diabetic neuropathy ([place]) 02/06/2018   Cervical myelopathy ([place]) 02/06/2018   Onychomycosis 10/30/2017   Other spondylosis with radiculopathy, cervical region 01/27/2017   Midfoot ulcer, right, limited to breakdown of skin ([place]) 11/15/2016   Lateral epicondylitis, left elbow 08/12/2016   Cellulitis of fifth toe of right foot 07/29/2016   Prostate cancer ([place]) 06/19/2016   Non-pressure chronic ulcer of other part of right foot limited to breakdown of skin ([name]) 04/04/2016   Cellulitis of leg, right 12/24/2015   Cellulitis of right lower extremity 12/24/2015   Gout 09/14/2012   HTN (hypertension) 09/14/2012   Hypothyroidism 09/14/2012   CAD (coronary artery disease) 09/14/2012   Diabetes mellitus ([place]) 09/14/2012   Hypercholesteremia    Neuropathy ([place])    Scot Jun, PT, DPT, OCS, ATC 12/06/20  4:29 PM    [facility] [address] [place], [place], [address] Phone: [phone]   Fax:  [phone]  Name: [name] MRN: [id number] Date of Birth: 1951/02/03

## 2020-12-08 ENCOUNTER — Encounter: Payer: Self-pay | Admitting: Physical Therapy

## 2020-12-08 ENCOUNTER — Other Ambulatory Visit: Payer: Self-pay

## 2020-12-08 ENCOUNTER — Ambulatory Visit (INDEPENDENT_AMBULATORY_CARE_PROVIDER_SITE_OTHER): Payer: Medicare Other | Admitting: Physical Therapy

## 2020-12-08 DIAGNOSIS — M6281 Muscle weakness (generalized): Secondary | ICD-10-CM

## 2020-12-08 DIAGNOSIS — M79671 Pain in right foot: Secondary | ICD-10-CM

## 2020-12-08 DIAGNOSIS — R262 Difficulty in walking, not elsewhere classified: Secondary | ICD-10-CM | POA: Diagnosis not present

## 2020-12-08 DIAGNOSIS — M79672 Pain in left foot: Secondary | ICD-10-CM | POA: Diagnosis not present

## 2020-12-08 DIAGNOSIS — R2681 Unsteadiness on feet: Secondary | ICD-10-CM

## 2020-12-08 DIAGNOSIS — R293 Abnormal posture: Secondary | ICD-10-CM

## 2020-12-08 NOTE — Therapy (Signed)
[facility] [address] [place], [place], [address] Phone: [phone]   Fax:  [phone]  Physical Therapy Treatment  Patient Details  Name: [name] MRN: [id number] Date of Birth: 11/27/1950 Referring Provider (PT): Dondra Prader NP   Encounter Date: 12/08/2020   PT End of Session - 12/08/20 1041     Visit Number 11    Number of Visits 20    Date for PT Re-Evaluation 01/12/21    Authorization Type Medicare, BCBS    Progress Note Due on Visit 20    PT Start Time 1000    PT Stop Time 1040    PT Time Calculation (min) 40 min    Equipment Utilized During Treatment Gait belt    Activity Tolerance Patient tolerated treatment well    Behavior During Therapy [facility] for tasks assessed/performed             Past Medical History:  Diagnosis Date   Ambulates with cane    straight cane   Cervical myelopathy ([place]) 02/06/2018   Chronic kidney disease    dailysis M W F- home   Complication of anesthesia    Coronary artery disease    Diabetes mellitus without complication ([place])    type2   Diabetic foot ulcer ([place])    Diabetic neuropathy ([place]) 02/06/2018   Gait abnormality 08/24/2018   GERD (gastroesophageal reflux disease)     01/06/20- not current   Gout    History of kidney stones    passed stones   Hypercholesteremia    Hypertension    Hypothyroidism    Neuromuscular disorder (HCC)    neuropathy left leg and bilateral feet   Neuropathy    PONV (postoperative nausea and vomiting)    Prostate cancer ([place])    PVD (peripheral vascular disease) ([place])    with amputations    Past Surgical History:  Procedure Laterality Date   A-FLUTTER ABLATION N/A 04/06/2020   Procedure: A-FLUTTER ABLATION;  Surgeon: [name], MD;  Location: [place] [facility];  Service: Cardiovascular;  Laterality: N/A;   AMPUTATION Left 12/25/2013   Procedure: AMPUTATION RAY LEFT 5TH RAY;  Surgeon: [name], MD;  [facility];  Service: Orthopedics;   Laterality: Left;   AMPUTATION Right 12/15/2018   Procedure: AMPUTATION OF 4TH AND 5TH TOES RIGHT FOOT;  Surgeon: [name], MD;  Location: [place];  Service: Orthopedics;  Laterality: Right;   APPLICATION OF WOUND VAC Right 12/15/2018   Procedure: APPLICATION OF WOUND VAC;  Surgeon: [name], MD;  Location: [place];  Service: Orthopedics;  Laterality: Right;   AV FISTULA PLACEMENT Left 03/25/2019   Procedure: LEFT ARM ARTERIOVENOUS (AV) FISTULA CREATION;  Surgeon: [name], MD;  Location: [place] [facility];  Service: Vascular;  Laterality: Left;   [place] Left 05/20/2019   Procedure: SECOND STAGE LEFT BASCILIC VEIN TRANSPOSITION;  Surgeon: [name], MD;  Location: [place] [facility];  Service: Vascular;  Laterality: Left;   CARDIAC CATHETERIZATION  02/17/2014   CHOLECYSTECTOMY     COLONOSCOPY  2011   in [place], [name]  2008   CYSTOSCOPY N/A 06/29/2020   Procedure: [place];  Surgeon: [name], MD;  Location: [place];  Service: Urology;  Laterality: N/A;   CYSTOSCOPY WITH FULGERATION Bilateral 06/17/2020   Procedure: CYSTOSCOPY,BILATERAL RETROGRADE, CLOT EVACUATION WITH FULGERATION OF THE BLADDER;  Surgeon: Abner Greenspan,  Bonna Gains, MD;  Location: [place];  Service: Urology;  Laterality: Bilateral;   CYSTOSCOPY WITH FULGERATION N/A 06/28/2020   Procedure: CYSTOSCOPY WITH CLOT EVACUATION AND FULGERATION OF BLEEDERS;  Surgeon: [name], MD;  Location: [place];  Service: Urology;  Laterality: N/A;  1 HR   I & D EXTREMITY Right 12/15/2018   Procedure: DEBRIDEMENT RIGHT FOOT;  Surgeon: [name], MD;  Location: [place];  Service: Orthopedics;  Laterality: Right;   I & D EXTREMITY Right 08/27/2019   Procedure: PARTIAL CUBOID EXCISION RIGHT FOOT;  Surgeon: [name], MD;  Location: [place];  Service: Orthopedics;  Laterality: Right;   I & D EXTREMITY Right 01/07/2020   Procedure: RIGHT FOOT  EXCISION INFECTED BONE;  Surgeon: [name], MD;  Location: [place];  Service: Orthopedics;  Laterality: Right;   NECK SURGERY     novemver 2019   TRANSURETHRAL RESECTION OF PROSTATE N/A 06/28/2020   Procedure: TRANSURETHRAL RESECTION OF THE PROSTATE (TURP);  Surgeon: [name], MD;  Location: [place];  Service: Urology;  Laterality: N/A;   WISDOM TOOTH EXTRACTION      There were no vitals filed for this visit.   Subjective Assessment - 12/08/20 1002     Subjective doing well, feels like he's getting better.    Patient is accompained by: Family member    Pertinent History Chronic kidney disease, CAD, diabetic neuropathy, GERD, Gout, HTN, hypothyroidism, history of prostate cancer, PVD    Dialysis, double upright brace on Rt and AFO on Lt    Limitations Standing;Walking;House hold activities    Patient Stated Goals Get to using cane    Currently in Pain? Yes    Pain Score 6     Pain Location Foot    Pain Orientation Right    Pain Descriptors / Indicators Aching;Numbness    Pain Type Chronic pain    Pain Onset More than a month ago    Pain Frequency Constant    Aggravating Factors  constant    Pain Relieving Factors nothing                               [facility] Adult PT Treatment/Exercise - 12/08/20 1004       Ankle Exercises: Aerobic   Nustep Lvl 6 10 mins UE/LE                 Balance Exercises - 12/08/20 1014       Balance Exercises: Standing   Step Ups Forward;Lateral;4 inch;UE support 2   x10 reps bil     OTAGO PROGRAM   Knee Flexor 20 reps;Weight (comment)   3#   Hip ABductor 20 reps;Weight (comment)   3#   Knee Bends 20 reps, support    Sideways Walking Assistive device    Tandem Stance 10 seconds, support    Tandem Walk Support                 PT Short Term Goals - 11/24/20 1131       PT SHORT TERM GOAL #1   Title Patient will demonstrate independent use of home exercise program to maintain progress from in  clinic treatments.    Time 3    Period Weeks    Status Achieved    Target Date 11/24/20               PT Long Term Goals - 12/06/20 1627  PT LONG TERM GOAL #1   Title Patient will demonstrate/report pain at worst less than or equal to 2/10 to facilitate minimal limitation in daily activity secondary to pain symptoms.    Time 10    Period Weeks    Status On-going    Target Date 01/12/21      PT LONG TERM GOAL #2   Title Patient will demonstrate independent use of home exercise program to facilitate ability to maintain/progress functional gains from skilled physical therapy services.    Time 10    Period Weeks    Status On-going    Target Date 01/12/21      PT LONG TERM GOAL #3   Title Pt. will demonstrate ambulation c quad cane c mod independence community distances > 300 ft at gait speed > 2.6 ft for community ambulation.    Time 10    Period Weeks    Status On-going    Target Date 01/12/21      PT LONG TERM GOAL #4   Title Pt. will demonstrate bilateral hip flexion, knee flexion strength 5/5 to facilitate progressive mobility.    Time 10    Period Weeks    Status On-going    Target Date 01/12/21      PT LONG TERM GOAL #5   Title Pt. will demonstrate FOTO outcome > or = 63 %    Time 10    Period Weeks    Status On-going    Target Date 01/12/21      PT LONG TERM GOAL #6   Title Pt. will demonstrate BERG > 45 to indicate reduced fall risk due to condition.    Time 10    Period Weeks    Status On-going    Target Date 01/12/21                   Plan - 12/08/20 1041     Clinical Impression Statement Pt tolerated session well today with continued components of OTAGO balance program performed today.  Will continue to benefit from PT to maximize function.    Personal Factors and Comorbidities Comorbidity 3+    Comorbidities Chronic kidney disease, CAD, diabetic neuropathy, GERD, Gout, HTN, hypothyroidism, history of prostate cancer, PVD     Dialysis, double upright brace on Rt and AFO on Lt    Stability/Clinical Decision Making Evolving/Moderate complexity    Rehab Potential --   fair to good   PT Frequency 2x / week    PT Duration Other (comment)   10 weeks   PT Treatment/Interventions ADLs/Self Care Home Management;Cryotherapy;Moist Heat;Balance training;Therapeutic exercise;Therapeutic activities;Functional mobility training;Stair training;Gait training;DME Instruction;Neuromuscular re-education;Patient/family education;Passive range of motion;Joint Manipulations;Splinting;Taping;Manual techniques;Orthotic Fit/Training    PT Next Visit Plan Continue to progress static and dynamic balance intervention and endurance; OTAGO components as tolerated    PT Home Exercise Plan [email or web address]    Consulted and Agree with Plan of Care Patient             Patient will benefit from skilled therapeutic intervention in order to improve the following deficits and impairments:  Abnormal gait, Decreased endurance, Hypomobility, Decreased strength, Decreased activity tolerance, Decreased balance, Decreased mobility, Difficulty walking, Impaired perceived functional ability, Improper body mechanics, Postural dysfunction, Impaired flexibility, Decreased coordination, Decreased range of motion  Visit Diagnosis: Pain in left foot  Pain in right foot  Muscle weakness (generalized)  Difficulty in walking, not elsewhere classified  Abnormal posture  Unsteadiness on feet     Problem List  Patient Active Problem List   Diagnosis Date Noted   Symptomatic anemia 09/13/2020   COVID-19 virus infection 09/13/2020   Pressure injury of skin 06/29/2020   Gross hematuria 06/16/2020   Typical atrial flutter ([place]) 03/24/2020   Bilateral pleural effusion 02/24/2020   Healthcare maintenance 01/28/2020   Shortness of breath 01/28/2020   History of partial ray amputation of fourth toe of right foot ([place]) 09/08/2019   Ulcerated, foot, right, with  necrosis of bone ([place])    Chronic cough 06/25/2019   Cutaneous abscess of right foot    Subacute osteomyelitis of right foot ([place]) 12/14/2018   AKI (acute kidney injury) ([place]) 12/14/2018   ESRD (end stage renal disease) ([place]) 12/14/2018   Gait abnormality 08/24/2018   Diabetic neuropathy ([place]) 02/06/2018   Cervical myelopathy ([place]) 02/06/2018   Onychomycosis 10/30/2017   Other spondylosis with radiculopathy, cervical region 01/27/2017   Midfoot ulcer, right, limited to breakdown of skin ([place]) 11/15/2016   Lateral epicondylitis, left elbow 08/12/2016   Cellulitis of fifth toe of right foot 07/29/2016   Prostate cancer ([place]) 06/19/2016   Non-pressure chronic ulcer of other part of right foot limited to breakdown of skin ([place]) 04/04/2016   Cellulitis of leg, right 12/24/2015   Cellulitis of right lower extremity 12/24/2015   Gout 09/14/2012   HTN (hypertension) 09/14/2012   Hypothyroidism 09/14/2012   CAD (coronary artery disease) 09/14/2012   Diabetes mellitus ([place]) 09/14/2012   Hypercholesteremia    Neuropathy ([place])       Laureen Abrahams, PT, DPT 12/08/20 10:43 AM     [facility] [address] [place], [place], [address] Phone: [phone]   Fax:  [phone]  Name: [name] MRN: [id number] Date of Birth: [birth year]

## 2020-12-13 ENCOUNTER — Encounter: Payer: Self-pay | Admitting: Rehabilitative and Restorative Service Providers"

## 2020-12-13 ENCOUNTER — Other Ambulatory Visit: Payer: Self-pay

## 2020-12-13 ENCOUNTER — Other Ambulatory Visit: Payer: Self-pay | Admitting: Internal Medicine

## 2020-12-13 ENCOUNTER — Ambulatory Visit (INDEPENDENT_AMBULATORY_CARE_PROVIDER_SITE_OTHER): Payer: Medicare Other | Admitting: Rehabilitative and Restorative Service Providers"

## 2020-12-13 DIAGNOSIS — M79671 Pain in right foot: Secondary | ICD-10-CM

## 2020-12-13 DIAGNOSIS — M79672 Pain in left foot: Secondary | ICD-10-CM | POA: Diagnosis not present

## 2020-12-13 DIAGNOSIS — R293 Abnormal posture: Secondary | ICD-10-CM

## 2020-12-13 DIAGNOSIS — M6281 Muscle weakness (generalized): Secondary | ICD-10-CM

## 2020-12-13 DIAGNOSIS — R262 Difficulty in walking, not elsewhere classified: Secondary | ICD-10-CM

## 2020-12-13 NOTE — Therapy (Addendum)
[facility] [address] [place], [place], [address] Phone: [phone]   Fax:  [phone]  Physical Therapy Treatment  Patient Details  Name: [name] MRN: [id number] Date of Birth: April 24, 1950 Referring Provider (PT): Dondra Prader NP   Encounter Date: 12/13/2020   PT End of Session - 12/13/20 1553     Visit Number 12    Number of Visits 20    Date for PT Re-Evaluation 01/12/21    Authorization Type Medicare, BCBS    Progress Note Due on Visit 75    PT Start Time 1547    PT Stop Time 1627    PT Time Calculation (min) 40 min    Equipment Utilized During Treatment Gait belt    Activity Tolerance Patient tolerated treatment well    Behavior During Therapy [facility] for tasks assessed/performed              12/15/20 0941  Symptoms/Limitations  Subjective Pt. stated consistent foot pain  Patient is accompained by: Family member  Pertinent History Chronic kidney disease, CAD, diabetic neuropathy, GERD, Gout, HTN, hypothyroidism, history of prostate cancer, PVD    Dialysis, double upright brace on Rt and AFO on Lt  Limitations Standing;Walking;House hold activities  Patient Stated Goals Get to using cane  Pain Assessment  Currently in Pain? Yes  Pain Score 5  Pain Location Foot  Pain Orientation Right  Pain Descriptors / Indicators Aching;Numbness  Pain Onset More than a month ago   Past Medical History:  Diagnosis Date   Ambulates with cane    straight cane   Cervical myelopathy ([place]) 02/06/2018   Chronic kidney disease    dailysis M W F- home   Complication of anesthesia    Coronary artery disease    Diabetes mellitus without complication ([place])    type2   Diabetic foot ulcer ([place])    Diabetic neuropathy ([place]) 02/06/2018   Gait abnormality 08/24/2018   GERD (gastroesophageal reflux disease)     01/06/20- not current   Gout    History of kidney stones    passed stones   Hypercholesteremia    Hypertension    Hypothyroidism     Neuromuscular disorder (HCC)    neuropathy left leg and bilateral feet   Neuropathy    PONV (postoperative nausea and vomiting)    Prostate cancer (HCC)    PVD (peripheral vascular disease) ([place])    with amputations    Past Surgical History:  Procedure Laterality Date   A-FLUTTER ABLATION N/A 04/06/2020   Procedure: A-FLUTTER ABLATION;  Surgeon: [name], MD;  Location: [place] [facility];  Service: Cardiovascular;  Laterality: N/A;   AMPUTATION Left 12/25/2013   Procedure: AMPUTATION RAY LEFT 5TH RAY;  Surgeon: [name], MD;  [facility];  Service: Orthopedics;  Laterality: Left;   AMPUTATION Right 12/15/2018   Procedure: AMPUTATION OF 4TH AND 5TH TOES RIGHT FOOT;  Surgeon: [name], MD;  Location: [place];  Service: Orthopedics;  Laterality: Right;   APPLICATION OF WOUND VAC Right 12/15/2018   Procedure: APPLICATION OF WOUND VAC;  Surgeon: [name], MD;  Location: [place];  Service: Orthopedics;  Laterality: Right;   AV FISTULA PLACEMENT Left 03/25/2019   Procedure: LEFT ARM ARTERIOVENOUS (AV) FISTULA CREATION;  Surgeon: [name], MD;  Location: [place];  Service: Vascular;  Laterality: Left;   [place] Left 05/20/2019   Procedure: SECOND STAGE LEFT BASCILIC VEIN  TRANSPOSITION;  Surgeon: [name], MD;  Location: [facility];  Service: Vascular;  Laterality: Left;   CARDIAC CATHETERIZATION  02/17/2014   CHOLECYSTECTOMY     COLONOSCOPY  2011   in [place], [place]  2008   CYSTOSCOPY N/A 06/29/2020   Procedure: [place];  Surgeon: [name], MD;  Location: [place];  Service: Urology;  Laterality: N/A;   CYSTOSCOPY WITH FULGERATION Bilateral 06/17/2020   Procedure: CYSTOSCOPY,BILATERAL RETROGRADE, CLOT EVACUATION WITH FULGERATION OF THE BLADDER;  Surgeon: Janith Lima, MD;  Location: [place];  Service: Urology;  Laterality: Bilateral;   CYSTOSCOPY  WITH FULGERATION N/A 06/28/2020   Procedure: CYSTOSCOPY WITH CLOT EVACUATION AND FULGERATION OF BLEEDERS;  Surgeon: [name], MD;  Location: [place];  Service: Urology;  Laterality: N/A;  1 HR   I & D EXTREMITY Right 12/15/2018   Procedure: DEBRIDEMENT RIGHT FOOT;  Surgeon: [name], MD;  Location: [place];  Service: Orthopedics;  Laterality: Right;   I & D EXTREMITY Right 08/27/2019   Procedure: PARTIAL CUBOID EXCISION RIGHT FOOT;  Surgeon: [name], MD;  Location: [place];  Service: Orthopedics;  Laterality: Right;   I & D EXTREMITY Right 01/07/2020   Procedure: RIGHT FOOT EXCISION INFECTED BONE;  Surgeon: [name], MD;  Location: [place];  Service: Orthopedics;  Laterality: Right;   NECK SURGERY     novemver 2019   TRANSURETHRAL RESECTION OF PROSTATE N/A 06/28/2020   Procedure: TRANSURETHRAL RESECTION OF THE PROSTATE (TURP);  Surgeon: [name], MD;  Location: [place];  Service: Urology;  Laterality: N/A;   WISDOM TOOTH EXTRACTION      There were no vitals filed for this visit.                      [facility] Adult PT Treatment/Exercise - 12/13/20 0001       Ambulation/Gait   Gait Comments ambulation quad cane c SBA from clinic to parking lot car c ramp navigation 250 ft      Ankle Exercises: Aerobic   Nustep Lvl 6 15 mins UE/LE      Ankle Exercises: Machines for Strengthening   Cybex Leg Press 100 lbs double leg 3 x 10                 Balance Exercises - 12/13/20 0001       Balance Exercises: Standing   Standing, One Foot on a Step 30 secs;2 reps;Eyes open   CGA to min A at times   Step Ups UE support 1;6 inch;Forward   x 10 bilateral c CGA   Other Standing Exercises alt toe tapping 6 inch step CGA Lt hand on rail x 10 bilateral      OTAGO PROGRAM   Sit to Stand 10 reps, no support   CGA c one instance of min A                 PT Short Term Goals - 11/24/20 1131       PT SHORT TERM GOAL #1   Title Patient will  demonstrate independent use of home exercise program to maintain progress from in clinic treatments.    Time 3    Period Weeks    Status Achieved    Target Date 11/24/20               PT Long Term Goals - 12/06/20  [place] #1   Title Patient will demonstrate/report pain at worst less than or equal to 2/10 to facilitate minimal limitation in daily activity secondary to pain symptoms.    Time 10    Period Weeks    Status On-going    Target Date 01/12/21      PT LONG TERM GOAL #2   Title Patient will demonstrate independent use of home exercise program to facilitate ability to maintain/progress functional gains from skilled physical therapy services.    Time 10    Period Weeks    Status On-going    Target Date 01/12/21      PT LONG TERM GOAL #3   Title Pt. will demonstrate ambulation c quad cane c mod independence community distances > 300 ft at gait speed > 2.6 ft for community ambulation.    Time 10    Period Weeks    Status On-going    Target Date 01/12/21      PT LONG TERM GOAL #4   Title Pt. will demonstrate bilateral hip flexion, knee flexion strength 5/5 to facilitate progressive mobility.    Time 10    Period Weeks    Status On-going    Target Date 01/12/21      PT LONG TERM GOAL #5   Title Pt. will demonstrate FOTO outcome > or = 63 %    Time 10    Period Weeks    Status On-going    Target Date 01/12/21      PT LONG TERM GOAL #6   Title Pt. will demonstrate BERG > 45 to indicate reduced fall risk due to condition.    Time 10    Period Weeks    Status On-going    Target Date 01/12/21                   Plan - 12/13/20 1606     Clinical Impression Statement Continued focus on improved LE strength and postural balance control.  Difficulty in Rt leg loading control more than Lt, though both have deficits in control.    Personal Factors and Comorbidities Comorbidity 3+    Comorbidities Chronic kidney disease, CAD, diabetic  neuropathy, GERD, Gout, HTN, hypothyroidism, history of prostate cancer, PVD    Dialysis, double upright brace on Rt and AFO on Lt    Stability/Clinical Decision Making Evolving/Moderate complexity    Rehab Potential --   fair to good   PT Frequency 2x / week    PT Duration Other (comment)   10 weeks   PT Treatment/Interventions ADLs/Self Care Home Management;Cryotherapy;Moist Heat;Balance training;Therapeutic exercise;Therapeutic activities;Functional mobility training;Stair training;Gait training;DME Instruction;Neuromuscular re-education;Patient/family education;Passive range of motion;Joint Manipulations;Splinting;Taping;Manual techniques;Orthotic Fit/Training    PT Next Visit Plan Continue to progress static and dynamic balance intervention and endurance    PT Home Exercise Plan [email or web address]    Consulted and Agree with Plan of Care Patient             Patient will benefit from skilled therapeutic intervention in order to improve the following deficits and impairments:  Abnormal gait, Decreased endurance, Hypomobility, Decreased strength, Decreased activity tolerance, Decreased balance, Decreased mobility, Difficulty walking, Impaired perceived functional ability, Improper body mechanics, Postural dysfunction, Impaired flexibility, Decreased coordination, Decreased range of motion  Visit Diagnosis: Pain in left foot  Pain in right foot  Muscle weakness (generalized)  Difficulty in walking, not elsewhere classified  Abnormal posture     Problem  List Patient Active Problem List   Diagnosis Date Noted   Symptomatic anemia 09/13/2020   COVID-19 virus infection 09/13/2020   Pressure injury of skin 06/29/2020   Gross hematuria 06/16/2020   Typical atrial flutter ([place]) 03/24/2020   Bilateral pleural effusion 02/24/2020   Healthcare maintenance 01/28/2020   Shortness of breath 01/28/2020   History of partial ray amputation of fourth toe of right foot ([place]) 09/08/2019   Ulcerated,  foot, right, with necrosis of bone ([place])    Chronic cough 06/25/2019   Cutaneous abscess of right foot    Subacute osteomyelitis of right foot ([place]) 12/14/2018   AKI (acute kidney injury) ([place]) 12/14/2018   ESRD (end stage renal disease) ([place]) 12/14/2018   Gait abnormality 08/24/2018   Diabetic neuropathy ([place]) 02/06/2018   Cervical myelopathy ([place]) 02/06/2018   Onychomycosis 10/30/2017   Other spondylosis with radiculopathy, cervical region 01/27/2017   Midfoot ulcer, right, limited to breakdown of skin ([place]) 11/15/2016   Lateral epicondylitis, left elbow 08/12/2016   Cellulitis of fifth toe of right foot 07/29/2016   Prostate cancer ([place]) 06/19/2016   Non-pressure chronic ulcer of other part of right foot limited to breakdown of skin ([place]) 04/04/2016   Cellulitis of leg, right 12/24/2015   Cellulitis of right lower extremity 12/24/2015   Gout 09/14/2012   HTN (hypertension) 09/14/2012   Hypothyroidism 09/14/2012   CAD (coronary artery disease) 09/14/2012   Diabetes mellitus ([place]) 09/14/2012   Hypercholesteremia    Neuropathy ([place])    Scot Jun, PT, DPT, OCS, ATC 12/13/20  4:27 PM    [facility] [address] [place], [place], [address] Phone: [phone]   Fax:  [phone]  Name: [name] MRN: [id number] Date of Birth: 03-Aug-1950

## 2020-12-15 ENCOUNTER — Ambulatory Visit (INDEPENDENT_AMBULATORY_CARE_PROVIDER_SITE_OTHER): Payer: Medicare Other | Admitting: Rehabilitative and Restorative Service Providers"

## 2020-12-15 ENCOUNTER — Other Ambulatory Visit: Payer: Self-pay

## 2020-12-15 ENCOUNTER — Encounter: Payer: Self-pay | Admitting: Rehabilitative and Restorative Service Providers"

## 2020-12-15 DIAGNOSIS — M6281 Muscle weakness (generalized): Secondary | ICD-10-CM

## 2020-12-15 DIAGNOSIS — M79672 Pain in left foot: Secondary | ICD-10-CM

## 2020-12-15 DIAGNOSIS — M79671 Pain in right foot: Secondary | ICD-10-CM

## 2020-12-15 DIAGNOSIS — R262 Difficulty in walking, not elsewhere classified: Secondary | ICD-10-CM | POA: Diagnosis not present

## 2020-12-15 DIAGNOSIS — R293 Abnormal posture: Secondary | ICD-10-CM

## 2020-12-15 NOTE — Therapy (Signed)
[facility] [address] [place], [place], [address] Phone: [phone]   Fax:  [phone]  Physical Therapy Treatment  Patient Details  Name: [name] MRN: [id number] Date of Birth: Jun 15, 1950 Referring Provider (PT): Dondra Prader NP   Encounter Date: 12/15/2020   PT End of Session - 12/15/20 0957     Visit Number 13    Number of Visits 20    Date for PT Re-Evaluation 01/12/21    Authorization Type Medicare, BCBS    Progress Note Due on Visit 76    PT Start Time 0922    PT Stop Time 1002    PT Time Calculation (min) 40 min    Equipment Utilized During Treatment Gait belt    Activity Tolerance Patient tolerated treatment well    Behavior During Therapy [facility] for tasks assessed/performed             Past Medical History:  Diagnosis Date   Ambulates with cane    straight cane   Cervical myelopathy ([place]) 02/06/2018   Chronic kidney disease    dailysis M W F- home   Complication of anesthesia    Coronary artery disease    Diabetes mellitus without complication ([place])    type2   Diabetic foot ulcer ([place])    Diabetic neuropathy ([place]) 02/06/2018   Gait abnormality 08/24/2018   GERD (gastroesophageal reflux disease)     01/06/20- not current   Gout    History of kidney stones    passed stones   Hypercholesteremia    Hypertension    Hypothyroidism    Neuromuscular disorder ([place])    neuropathy left leg and bilateral feet   Neuropathy    PONV (postoperative nausea and vomiting)    Prostate cancer ([place])    PVD (peripheral vascular disease) ([place])    with amputations    Past Surgical History:  Procedure Laterality Date   A-FLUTTER ABLATION N/A 04/06/2020   Procedure: A-FLUTTER ABLATION;  Surgeon: [name], MD;  Location: [place] [facility];  Service: Cardiovascular;  Laterality: N/A;   AMPUTATION Left 12/25/2013   Procedure: AMPUTATION RAY LEFT 5TH RAY;  Surgeon: [name], MD;  [facility];  Service: Orthopedics;   Laterality: Left;   AMPUTATION Right 12/15/2018   Procedure: AMPUTATION OF 4TH AND 5TH TOES RIGHT FOOT;  Surgeon: [name], MD;  Location: [place];  Service: Orthopedics;  Laterality: Right;   APPLICATION OF WOUND VAC Right 12/15/2018   Procedure: APPLICATION OF WOUND VAC;  Surgeon: [name], MD;  Location: [facility];  Service: Orthopedics;  Laterality: Right;   AV FISTULA PLACEMENT Left 03/25/2019   Procedure: LEFT ARM ARTERIOVENOUS (AV) FISTULA CREATION;  Surgeon: [name], MD;  Location: [place] [facility];  Service: Vascular;  Laterality: Left;   [place] Left 05/20/2019   Procedure: SECOND STAGE LEFT BASCILIC VEIN TRANSPOSITION;  Surgeon: [name], MD;  Location: [place] [facility];  Service: Vascular;  Laterality: Left;   CARDIAC CATHETERIZATION  02/17/2014   CHOLECYSTECTOMY     COLONOSCOPY  2011   in [place], [place]  2008   CYSTOSCOPY N/A 06/29/2020   Procedure: [place];  Surgeon: [name], MD;  Location: [place] [place];  Service: Urology;  Laterality: N/A;   CYSTOSCOPY WITH FULGERATION Bilateral 06/17/2020   Procedure: CYSTOSCOPY,BILATERAL RETROGRADE, CLOT EVACUATION WITH FULGERATION OF THE BLADDER;  Surgeon: Abner Greenspan,  Bonna Gains, MD;  Location: [place];  Service: Urology;  Laterality: Bilateral;   CYSTOSCOPY WITH FULGERATION N/A 06/28/2020   Procedure: CYSTOSCOPY WITH CLOT EVACUATION AND FULGERATION OF BLEEDERS;  Surgeon: [name], MD;  Location: [place];  Service: Urology;  Laterality: N/A;  1 HR   I & D EXTREMITY Right 12/15/2018   Procedure: DEBRIDEMENT RIGHT FOOT;  Surgeon: [name], MD;  Location: [place];  Service: Orthopedics;  Laterality: Right;   I & D EXTREMITY Right 08/27/2019   Procedure: PARTIAL CUBOID EXCISION RIGHT FOOT;  Surgeon: [name], MD;  Location: [place];  Service: Orthopedics;  Laterality: Right;   I & D EXTREMITY Right 01/07/2020   Procedure: RIGHT FOOT  EXCISION INFECTED BONE;  Surgeon: [name], MD;  Location: [place];  Service: Orthopedics;  Laterality: Right;   NECK SURGERY     novemver 2019   TRANSURETHRAL RESECTION OF PROSTATE N/A 06/28/2020   Procedure: TRANSURETHRAL RESECTION OF THE PROSTATE (TURP);  Surgeon: [name], MD;  Location: [place];  Service: Urology;  Laterality: N/A;   WISDOM TOOTH EXTRACTION      There were no vitals filed for this visit.   Subjective Assessment - 12/15/20 0938     Subjective Pt. indicated he has an MD visit with Dr. [name] on Monday.  No other specific report.    Patient is accompained by: Family member    Pertinent History Chronic kidney disease, CAD, diabetic neuropathy, GERD, Gout, HTN, hypothyroidism, history of prostate cancer, PVD    Dialysis, double upright brace on Rt and AFO on Lt    Limitations Standing;Walking;House hold activities    Patient Stated Goals Get to using cane    Currently in Pain? Yes    Pain Score 6     Pain Location Foot    Pain Orientation Right    Pain Descriptors / Indicators Aching;Numbness    Pain Type Chronic pain    Pain Onset More than a month ago    Pain Frequency Constant    Aggravating Factors  constant    Pain Relieving Factors nothing specific                               [facility] Adult PT Treatment/Exercise - 12/15/20 0001       Neuro Re-ed    Neuro Re-ed Details  see balance exercise flow sheet      Ankle Exercises: Aerobic   Nustep Lvl 6 12 mins UE/LE      Ankle Exercises: Machines for Strengthening   Cybex Leg Press 112 lbs double leg 3 x 10                 Balance Exercises - 12/15/20 0001       Balance Exercises: Standing   Other Standing Exercises anterior weight shifts off clinician with hip strategy x 15    Other Standing Exercises Comments modified tandem stance 1 min c occasional assist to prevent loss of balance, performed bilateral      OTAGO PROGRAM   Head Movements Standing   feet shoulder  width x 10 Lt and Rt   Hip ABductor 10 reps   Rt hand on bar 10 x bilateral   Backwards Walking Support   10 ft x 5 fwd/back each Rt UE assist   Sideways Walking Assistive device   10 ft x 5 each way Rt hand on bar  Sit to Stand 10 reps, no support   chair c foam pad                 PT Short Term Goals - 11/24/20 1131       PT SHORT TERM GOAL #1   Title Patient will demonstrate independent use of home exercise program to maintain progress from in clinic treatments.    Time 3    Period Weeks    Status Achieved    Target Date 11/24/20               PT Long Term Goals - 12/06/20 1627       PT LONG TERM GOAL #1   Title Patient will demonstrate/report pain at worst less than or equal to 2/10 to facilitate minimal limitation in daily activity secondary to pain symptoms.    Time 10    Period Weeks    Status On-going    Target Date 01/12/21      PT LONG TERM GOAL #2   Title Patient will demonstrate independent use of home exercise program to facilitate ability to maintain/progress functional gains from skilled physical therapy services.    Time 10    Period Weeks    Status On-going    Target Date 01/12/21      PT LONG TERM GOAL #3   Title Pt. will demonstrate ambulation c quad cane c mod independence community distances > 300 ft at gait speed > 2.6 ft for community ambulation.    Time 10    Period Weeks    Status On-going    Target Date 01/12/21      PT LONG TERM GOAL #4   Title Pt. will demonstrate bilateral hip flexion, knee flexion strength 5/5 to facilitate progressive mobility.    Time 10    Period Weeks    Status On-going    Target Date 01/12/21      PT LONG TERM GOAL #5   Title Pt. will demonstrate FOTO outcome > or = 63 %    Time 10    Period Weeks    Status On-going    Target Date 01/12/21      PT LONG TERM GOAL #6   Title Pt. will demonstrate BERG > 45 to indicate reduced fall risk due to condition.    Time 10    Period Weeks    Status  On-going    Target Date 01/12/21                   Plan - 12/15/20 0956     Clinical Impression Statement Continued inclusion of OTAGO balance program aspects.  Mild improvements in balance control, relying mild to moderate on one hand assist vs. previous consistent one hand assist/2 hand assist.  Rt leg loading still troublesome more than Lt.    Personal Factors and Comorbidities Comorbidity 3+    Comorbidities Chronic kidney disease, CAD, diabetic neuropathy, GERD, Gout, HTN, hypothyroidism, history of prostate cancer, PVD    Dialysis, double upright brace on Rt and AFO on Lt    Stability/Clinical Decision Making Evolving/Moderate complexity    Rehab Potential --   fair to good   PT Frequency 2x / week    PT Duration Other (comment)   10 weeks   PT Treatment/Interventions ADLs/Self Care Home Management;Cryotherapy;Moist Heat;Balance training;Therapeutic exercise;Therapeutic activities;Functional mobility training;Stair training;Gait training;DME Instruction;Neuromuscular re-education;Patient/family education;Passive range of motion;Joint Manipulations;Splinting;Taping;Manual techniques;Orthotic Fit/Training    PT Next Visit Plan Continue to progress  static and dynamic balance intervention and endurance to improve ambulation stability    PT Home Exercise Plan [email or web address]    Consulted and Agree with Plan of Care Patient             Patient will benefit from skilled therapeutic intervention in order to improve the following deficits and impairments:  Abnormal gait, Decreased endurance, Hypomobility, Decreased strength, Decreased activity tolerance, Decreased balance, Decreased mobility, Difficulty walking, Impaired perceived functional ability, Improper body mechanics, Postural dysfunction, Impaired flexibility, Decreased coordination, Decreased range of motion  Visit Diagnosis: Pain in left foot  Pain in right foot  Muscle weakness (generalized)  Difficulty in walking, not  elsewhere classified  Abnormal posture     Problem List Patient Active Problem List   Diagnosis Date Noted   Symptomatic anemia 09/13/2020   COVID-19 virus infection 09/13/2020   Pressure injury of skin 06/29/2020   Gross hematuria 06/16/2020   Typical atrial flutter ([name]) 03/24/2020   Bilateral pleural effusion 02/24/2020   Healthcare maintenance 01/28/2020   Shortness of breath 01/28/2020   History of partial ray amputation of fourth toe of right foot ([place]) 09/08/2019   Ulcerated, foot, right, with necrosis of bone ([facility])    Chronic cough 06/25/2019   Cutaneous abscess of right foot    Subacute osteomyelitis of right foot ([place]) 12/14/2018   AKI (acute kidney injury) ([place]) 12/14/2018   ESRD (end stage renal disease) ([place]) 12/14/2018   Gait abnormality 08/24/2018   Diabetic neuropathy ([place]) 02/06/2018   Cervical myelopathy ([place]) 02/06/2018   Onychomycosis 10/30/2017   Other spondylosis with radiculopathy, cervical region 01/27/2017   Midfoot ulcer, right, limited to breakdown of skin ([place]) 11/15/2016   Lateral epicondylitis, left elbow 08/12/2016   Cellulitis of fifth toe of right foot 07/29/2016   Prostate cancer ([place]) 06/19/2016   Non-pressure chronic ulcer of other part of right foot limited to breakdown of skin ([place]) 04/04/2016   Cellulitis of leg, right 12/24/2015   Cellulitis of right lower extremity 12/24/2015   Gout 09/14/2012   HTN (hypertension) 09/14/2012   Hypothyroidism 09/14/2012   CAD (coronary artery disease) 09/14/2012   Diabetes mellitus ([place]) 09/14/2012   Hypercholesteremia    Neuropathy ([place])    Scot Jun, PT, DPT, OCS, ATC 12/15/20  10:03 AM   [facility] [address] [place], [place], [address] Phone: [phone]   Fax:  [phone]  Name: Emmitte Surgeon MRN: [id number] Date of Birth: 12-03-1950

## 2020-12-18 ENCOUNTER — Encounter: Payer: Self-pay | Admitting: Orthopedic Surgery

## 2020-12-18 ENCOUNTER — Other Ambulatory Visit: Payer: Self-pay

## 2020-12-18 ENCOUNTER — Ambulatory Visit (INDEPENDENT_AMBULATORY_CARE_PROVIDER_SITE_OTHER): Payer: Medicare Other | Admitting: Orthopedic Surgery

## 2020-12-18 VITALS — Ht 70.0 in | Wt 205.0 lb

## 2020-12-18 DIAGNOSIS — I2510 Atherosclerotic heart disease of native coronary artery without angina pectoris: Secondary | ICD-10-CM

## 2020-12-18 DIAGNOSIS — L97411 Non-pressure chronic ulcer of right heel and midfoot limited to breakdown of skin: Secondary | ICD-10-CM | POA: Diagnosis not present

## 2020-12-18 DIAGNOSIS — L97421 Non-pressure chronic ulcer of left heel and midfoot limited to breakdown of skin: Secondary | ICD-10-CM

## 2020-12-19 ENCOUNTER — Encounter: Payer: Self-pay | Admitting: Orthopedic Surgery

## 2020-12-19 NOTE — Progress Notes (Signed)
Office Visit Note   Patient: [name]           Date of Birth: 1950-08-15           MRN: [id number] Visit Date: 12/18/2020              Requested by: Lorenda Hatchet, [place],  [place] [address] PCP: Lorenda Hatchet, FNP  Chief Complaint  Patient presents with   Right Foot - Pain      HPI: Patient is a 70 year old gentleman who presents complaining of recurrent bleeding ulcers on both feet.  Patient states he has had bleeding on the right foot for about 1 to 2 weeks.  Patient denies any specific injuries.  Patient complains of increasing pain in the right foot.  He has extra-depth shoes custom orthotics and double upright braces bilaterally.  Assessment & Plan: Visit Diagnoses:  1. Midfoot ulcer, right, limited to breakdown of skin ([place])   2. Midfoot ulcer, left, limited to breakdown of skin ([place])     Plan: Continue with his protective shoe wear routine wound care protracted dressing  Follow-Up Instructions: Return in about 4 weeks (around 01/15/2021).   Ortho Exam  Patient is alert, oriented, no adenopathy, well-dressed, normal affect, normal respiratory effort. Examination patient has a fixed cavovarus deformity of the right foot status post lateral ray amputations.  He has a large ulcer beneath the base of the fifth metatarsal right foot.  After informed consent a 10 blade knife was used to debride the skin and soft tissue back to healthy viable granulation tissue this was touched with silver nitrate for hemostasis.  The ulcer was 1 cm in diameter prior to debridement after debridement the ulcer is 2 cm in diameter 5 mm deep he did have a area of blood beneath the callused skin with tunneling there was good healthy tissue after debridement.  Examination the left foot he also has a Wagner grade 1 ulcer beneath the fifth metatarsal head and base of the fifth metatarsal.  These wounds were about 5 mm in diameter before debridement.  After  debridement the ulcers were 10 mm in diameter 1 mm deep these were touched with silver nitrate for hemostasis.  Band-Aids were applied his shoes and braces were reapplied.  Imaging: No results found. No images are attached to the encounter.  Labs: Lab Results  Component Value Date   HGBA1C 5.4 09/13/2020   HGBA1C 5.7 (H) 06/16/2020   HGBA1C 6.3 (H) 12/14/2018   ESRSEDRATE 34 (H) 01/24/2020   ESRSEDRATE 41 (H) 01/10/2020   ESRSEDRATE 55 (H) 12/27/2019   CRP 8.8 (H) 01/24/2020   CRP 35.9 (H) 01/10/2020   CRP 77.9 (H) 12/27/2019   REPTSTATUS 09/29/2020 FINAL 09/28/2020   GRAMSTAIN  01/07/2020    FEW WBC PRESENT, PREDOMINANTLY PMN NO ORGANISMS SEEN    CULT (A) 09/28/2020    <10,000 COLONIES/mL INSIGNIFICANT GROWTH Performed at [facility] [address], [place], [place] [address]    LABORGA STAPHYLOCOCCUS HAEMOLYTICUS 01/07/2020   LABORGA STENOTROPHOMONAS MALTOPHILIA 01/07/2020     Lab Results  Component Value Date   ALBUMIN 3.2 (L) 09/14/2020   ALBUMIN 3.9 09/13/2020   ALBUMIN 2.9 (L) 06/21/2020   PREALBUMIN 14.8 (L) 12/14/2018    Lab Results  Component Value Date   MG 2.0 06/28/2020   MG 1.8 06/27/2020   MG 1.9 06/24/2020   No results found for: [facility]  Lab Results  Component Value Date  PREALBUMIN 14.8 (L) 12/14/2018   CBC EXTENDED Latest Ref Rng & Units 09/28/2020 09/14/2020 09/13/2020  WBC 4.0 - 10.5 K/uL 11.0(H) 5.2 -  RBC 4.22 - 5.81 MIL/uL 3.73(L) 2.75(L) -  HGB 13.0 - 17.0 g/dL 10.3(L) 7.5(L) 7.1(L)  HCT 39.0 - 52.0 % 33.1(L) 24.1(L) 22.5(L)  PLT 150 - 400 K/uL 190 120(L) -  NEUTROABS 1.7 - 7.7 K/uL - - -  LYMPHSABS 0.7 - 4.0 K/uL - - -     Body mass index is 29.41 kg/m.  Orders:  No orders of the defined types were placed in this encounter.  No orders of the defined types were placed in this encounter.    Procedures: No procedures performed  Clinical Data: No additional findings.  ROS:  All other systems negative, except as  noted in the HPI. Review of Systems  Objective: Vital Signs: Ht 5\' 10"  (1.778 m)   Wt 205 lb (93 kg)   BMI 29.41 kg/m   Specialty Comments:  No specialty comments available.  PMFS History: Patient Active Problem List   Diagnosis Date Noted   Symptomatic anemia 09/13/2020   COVID-19 virus infection 09/13/2020   Pressure injury of skin 06/29/2020   Gross hematuria 06/16/2020   Typical atrial flutter ([place]) 03/24/2020   Bilateral pleural effusion 02/24/2020   Healthcare maintenance 01/28/2020   Shortness of breath 01/28/2020   History of partial ray amputation of fourth toe of right foot ([place]) 09/08/2019   Ulcerated, foot, right, with necrosis of bone ([facility])    Chronic cough 06/25/2019   Cutaneous abscess of right foot    Subacute osteomyelitis of right foot ([place]) 12/14/2018   AKI (acute kidney injury) ([place]) 12/14/2018   ESRD (end stage renal disease) ([place]) 12/14/2018   Gait abnormality 08/24/2018   Diabetic neuropathy ([place]) 02/06/2018   Cervical myelopathy ([place]) 02/06/2018   Onychomycosis 10/30/2017   Other spondylosis with radiculopathy, cervical region 01/27/2017   Midfoot ulcer, right, limited to breakdown of skin ([place]) 11/15/2016   Lateral epicondylitis, left elbow 08/12/2016   Cellulitis of fifth toe of right foot 07/29/2016   Prostate cancer ([place]) 06/19/2016   Non-pressure chronic ulcer of other part of right foot limited to breakdown of skin ([place]) 04/04/2016   Cellulitis of leg, right 12/24/2015   Cellulitis of right lower extremity 12/24/2015   Gout 09/14/2012   HTN (hypertension) 09/14/2012   Hypothyroidism 09/14/2012   CAD (coronary artery disease) 09/14/2012   Diabetes mellitus ([place]) 09/14/2012   Hypercholesteremia    Neuropathy ([place])    Past Medical History:  Diagnosis Date   Ambulates with cane    straight cane   Cervical myelopathy ([place]) 02/06/2018   Chronic kidney disease    dailysis M W F- home   Complication of anesthesia    Coronary artery  disease    Diabetes mellitus without complication ([place])    type2   Diabetic foot ulcer ([place])    Diabetic neuropathy ([place]) 02/06/2018   Gait abnormality 08/24/2018   GERD (gastroesophageal reflux disease)     01/06/20- not current   Gout    History of kidney stones    passed stones   Hypercholesteremia    Hypertension    Hypothyroidism    Neuromuscular disorder ([place])    neuropathy left leg and bilateral feet   Neuropathy    PONV (postoperative nausea and vomiting)    Prostate cancer ([place])    PVD (peripheral vascular disease) ([place])    with amputations    Family History  Problem Relation Age of Onset   Diabetes Mellitus II Mother    Kidney disease Mother    Diabetes Mellitus II Father    CAD Father    Cancer Father        prostate   Kidney disease Father    Diabetes Mellitus II Brother    Kidney disease Brother    Diabetes Mellitus II Brother    Stomach cancer Brother 78   Kidney disease Brother    Colon cancer Neg Hx    Colon polyps Neg Hx    Esophageal cancer Neg Hx    Rectal cancer Neg Hx    Pancreatic cancer Neg Hx     Past Surgical History:  Procedure Laterality Date   A-FLUTTER ABLATION N/A 04/06/2020   Procedure: A-FLUTTER ABLATION;  Surgeon: [name], MD;  Location: [facility];  Service: Cardiovascular;  Laterality: N/A;   AMPUTATION Left 12/25/2013   Procedure: AMPUTATION RAY LEFT 5TH RAY;  Surgeon: [name], MD;  [facility];  Service: Orthopedics;  Laterality: Left;   AMPUTATION Right 12/15/2018   Procedure: AMPUTATION OF 4TH AND 5TH TOES RIGHT FOOT;  Surgeon: [name], MD;  Location: [facility];  Service: Orthopedics;  Laterality: Right;   APPLICATION OF WOUND VAC Right 12/15/2018   Procedure: APPLICATION OF WOUND VAC;  Surgeon: [name], MD;  Location: [facility];  Service: Orthopedics;  Laterality: Right;   AV FISTULA PLACEMENT Left 03/25/2019   Procedure: LEFT ARM ARTERIOVENOUS (AV) FISTULA CREATION;  Surgeon: [name], MD;   Location: [facility];  Service: Vascular;  Laterality: Left;   [place] Left 05/20/2019   Procedure: SECOND STAGE LEFT BASCILIC VEIN TRANSPOSITION;  Surgeon: [name], MD;  Location: [facility];  Service: Vascular;  Laterality: Left;   CARDIAC CATHETERIZATION  02/17/2014   CHOLECYSTECTOMY     COLONOSCOPY  2011   in [place], [place]  2008   CYSTOSCOPY N/A 06/29/2020   Procedure: [place];  Surgeon: [name], MD;  Location: [facility];  Service: Urology;  Laterality: N/A;   CYSTOSCOPY WITH FULGERATION Bilateral 06/17/2020   Procedure: CYSTOSCOPY,BILATERAL RETROGRADE, CLOT EVACUATION WITH FULGERATION OF THE BLADDER;  Surgeon: Janith Lima, MD;  Location: [facility];  Service: Urology;  Laterality: Bilateral;   CYSTOSCOPY WITH FULGERATION N/A 06/28/2020   Procedure: CYSTOSCOPY WITH CLOT EVACUATION AND FULGERATION OF BLEEDERS;  Surgeon: [name], MD;  Location: [facility];  Service: Urology;  Laterality: N/A;  1 HR   I & D EXTREMITY Right 12/15/2018   Procedure: DEBRIDEMENT RIGHT FOOT;  Surgeon: [name], MD;  Location: [facility];  Service: Orthopedics;  Laterality: Right;   I & D EXTREMITY Right 08/27/2019   Procedure: PARTIAL CUBOID EXCISION RIGHT FOOT;  Surgeon: [name], MD;  Location: [facility];  Service: Orthopedics;  Laterality: Right;   I & D EXTREMITY Right 01/07/2020   Procedure: RIGHT FOOT EXCISION INFECTED BONE;  Surgeon: [name], MD;  Location: [facility];  Service: Orthopedics;  Laterality: Right;   NECK SURGERY     novemver 2019   TRANSURETHRAL RESECTION OF PROSTATE N/A 06/28/2020   Procedure: TRANSURETHRAL RESECTION OF THE PROSTATE (TURP);  Surgeon: [name], MD;  Location: [facility];  Service: Urology;  Laterality: N/A;   WISDOM TOOTH EXTRACTION     Social History   Occupational History  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

## 2020-12-20 ENCOUNTER — Encounter: Payer: Medicare Other | Admitting: Rehabilitative and Restorative Service Providers"

## 2020-12-22 ENCOUNTER — Encounter: Payer: Self-pay | Admitting: Rehabilitative and Restorative Service Providers"

## 2020-12-22 ENCOUNTER — Ambulatory Visit (INDEPENDENT_AMBULATORY_CARE_PROVIDER_SITE_OTHER): Payer: Medicare Other | Admitting: Rehabilitative and Restorative Service Providers"

## 2020-12-22 ENCOUNTER — Other Ambulatory Visit: Payer: Self-pay

## 2020-12-22 DIAGNOSIS — M79672 Pain in left foot: Secondary | ICD-10-CM

## 2020-12-22 DIAGNOSIS — R293 Abnormal posture: Secondary | ICD-10-CM

## 2020-12-22 DIAGNOSIS — M6281 Muscle weakness (generalized): Secondary | ICD-10-CM | POA: Diagnosis not present

## 2020-12-22 DIAGNOSIS — M79671 Pain in right foot: Secondary | ICD-10-CM

## 2020-12-22 DIAGNOSIS — R262 Difficulty in walking, not elsewhere classified: Secondary | ICD-10-CM

## 2020-12-22 NOTE — Therapy (Signed)
[facility] [address] [place], [place], [address] Phone: [phone]   Fax:  [phone]  Physical Therapy Treatment  Patient Details  Name: [name] MRN: [id number] Date of Birth: 10-17-1950 Referring Provider (PT): Dondra Prader NP   Encounter Date: 12/22/2020   PT End of Session - 12/22/20 0930     Visit Number 14    Number of Visits 20    Date for PT Re-Evaluation 01/12/21    Authorization Type Medicare, BCBS    Progress Note Due on Visit 42    PT Start Time 0920    PT Stop Time 1000    PT Time Calculation (min) 40 min    Equipment Utilized During Treatment Gait belt    Activity Tolerance Patient tolerated treatment well    Behavior During Therapy [facility] for tasks assessed/performed             Past Medical History:  Diagnosis Date   Ambulates with cane    straight cane   Cervical myelopathy ([place]) 02/06/2018   Chronic kidney disease    dailysis M W F- home   Complication of anesthesia    Coronary artery disease    Diabetes mellitus without complication ([place])    type2   Diabetic foot ulcer ([place])    Diabetic neuropathy ([place]) 02/06/2018   Gait abnormality 08/24/2018   GERD (gastroesophageal reflux disease)     01/06/20- not current   Gout    History of kidney stones    passed stones   Hypercholesteremia    Hypertension    Hypothyroidism    Neuromuscular disorder (HCC)    neuropathy left leg and bilateral feet   Neuropathy    PONV (postoperative nausea and vomiting)    Prostate cancer ([place])    PVD (peripheral vascular disease) ([place])    with amputations    Past Surgical History:  Procedure Laterality Date   A-FLUTTER ABLATION N/A 04/06/2020   Procedure: A-FLUTTER ABLATION;  Surgeon: [name], MD;  Location: [place] [facility];  Service: Cardiovascular;  Laterality: N/A;   AMPUTATION Left 12/25/2013   Procedure: AMPUTATION RAY LEFT 5TH RAY;  Surgeon: [name], MD;  [facility];  Service: Orthopedics;   Laterality: Left;   AMPUTATION Right 12/15/2018   Procedure: AMPUTATION OF 4TH AND 5TH TOES RIGHT FOOT;  Surgeon: [name], MD;  Location: [place];  Service: Orthopedics;  Laterality: Right;   APPLICATION OF WOUND VAC Right 12/15/2018   Procedure: APPLICATION OF WOUND VAC;  Surgeon: [name], MD;  Location: [place];  Service: Orthopedics;  Laterality: Right;   AV FISTULA PLACEMENT Left 03/25/2019   Procedure: LEFT ARM ARTERIOVENOUS (AV) FISTULA CREATION;  Surgeon: [name], MD;  Location: [place] [facility];  Service: Vascular;  Laterality: Left;   [place] Left 05/20/2019   Procedure: SECOND STAGE LEFT BASCILIC VEIN TRANSPOSITION;  Surgeon: [name], MD;  Location: [place] [facility];  Service: Vascular;  Laterality: Left;   CARDIAC CATHETERIZATION  02/17/2014   CHOLECYSTECTOMY     COLONOSCOPY  2011   in [place], [place]  2008   CYSTOSCOPY N/A 06/29/2020   Procedure: [place];  Surgeon: [name], MD;  Location: [place];  Service: Urology;  Laterality: N/A;   CYSTOSCOPY WITH FULGERATION Bilateral 06/17/2020   Procedure: CYSTOSCOPY,BILATERAL RETROGRADE, CLOT EVACUATION WITH FULGERATION OF THE BLADDER;  Surgeon: Abner Greenspan,  Bonna Gains, MD;  Location: [place];  Service: Urology;  Laterality: Bilateral;   CYSTOSCOPY WITH FULGERATION N/A 06/28/2020   Procedure: CYSTOSCOPY WITH CLOT EVACUATION AND FULGERATION OF BLEEDERS;  Surgeon: [name], MD;  Location: [place];  Service: Urology;  Laterality: N/A;  1 HR   I & D EXTREMITY Right 12/15/2018   Procedure: DEBRIDEMENT RIGHT FOOT;  Surgeon: [name], MD;  Location: [place];  Service: Orthopedics;  Laterality: Right;   I & D EXTREMITY Right 08/27/2019   Procedure: PARTIAL CUBOID EXCISION RIGHT FOOT;  Surgeon: [name], MD;  Location: [place];  Service: Orthopedics;  Laterality: Right;   I & D EXTREMITY Right 01/07/2020   Procedure: RIGHT FOOT  EXCISION INFECTED BONE;  Surgeon: [name], MD;  Location: [place];  Service: Orthopedics;  Laterality: Right;   NECK SURGERY     novemver 2019   TRANSURETHRAL RESECTION OF PROSTATE N/A 06/28/2020   Procedure: TRANSURETHRAL RESECTION OF THE PROSTATE (TURP);  Surgeon: [name], MD;  Location: [place];  Service: Urology;  Laterality: N/A;   WISDOM TOOTH EXTRACTION      There were no vitals filed for this visit.   Subjective Assessment - 12/22/20 0928     Subjective Pt. stated he used walker while in [place].  Pt. indicated his "foot got carved up pretty good."  Had pain for two days.  Feeling better today.    Patient is accompained by: Family member    Pertinent History Chronic kidney disease, CAD, diabetic neuropathy, GERD, Gout, HTN, hypothyroidism, history of prostate cancer, PVD    Dialysis, double upright brace on Rt and AFO on Lt    Limitations Standing;Walking;House hold activities    Patient Stated Goals Get to using cane    Currently in Pain? Yes    Pain Score 5     Pain Location Foot    Pain Orientation Right    Pain Descriptors / Indicators Aching;Sore    Pain Type Chronic pain    Pain Onset More than a month ago    Pain Frequency Constant    Aggravating Factors  constant, worse after debridement of foot    Pain Relieving Factors nothing specific                [facility] PT Assessment - 12/22/20 0001       Assessment   Medical Diagnosis Z89.421 (ICD-10-CM) - History of partial ray amputation of fourth toe of right foot ([place])    Referring Provider (PT) Dondra Prader NP      Functional Tests   Functional tests Other      Other:   Other/ Comments Ascending/descending ramp and curb 6 inch c quad cane and CGA [name]                           [facility] Adult PT Treatment/Exercise - 12/22/20 0001       Therapeutic Activites    Other Therapeutic Activities ramp and curb (6 inch) x 6 c CGA and SPC use      Neuro Re-ed    Neuro Re-ed Details   see balance flow sheet      Ankle Exercises: Aerobic   Nustep Lvl 6 15 mins UE/LE                 Balance Exercises - 12/22/20 0001       Balance Exercises: Standing   Standing, One Foot  on a Step 6 inch;Eyes open   1 min bilateral occasional hand assist for correction     OTAGO PROGRAM   Head Movements Other reps (comment)   Ambulation c various command head turns 150 ft c CGA and quad cane   Hip ABductor 10 reps   hands on bar   Backwards Walking Support   Rt hand on bar in // bars 10 ft x 5 fwd/reverse each   Sideways Walking Assistive device   10 ft x 5 each way c cone step over and bilateral hand assist on bar                 PT Short Term Goals - 11/24/20 1131       PT SHORT TERM GOAL #1   Title Patient will demonstrate independent use of home exercise program to maintain progress from in clinic treatments.    Time 3    Period Weeks    Status Achieved    Target Date 11/24/20               PT Long Term Goals - 12/06/20 1627       PT LONG TERM GOAL #1   Title Patient will demonstrate/report pain at worst less than or equal to 2/10 to facilitate minimal limitation in daily activity secondary to pain symptoms.    Time 10    Period Weeks    Status On-going    Target Date 01/12/21      PT LONG TERM GOAL #2   Title Patient will demonstrate independent use of home exercise program to facilitate ability to maintain/progress functional gains from skilled physical therapy services.    Time 10    Period Weeks    Status On-going    Target Date 01/12/21      PT LONG TERM GOAL #3   Title Pt. will demonstrate ambulation c quad cane c mod independence community distances > 300 ft at gait speed > 2.6 ft for community ambulation.    Time 10    Period Weeks    Status On-going    Target Date 01/12/21      PT LONG TERM GOAL #4   Title Pt. will demonstrate bilateral hip flexion, knee flexion strength 5/5 to facilitate progressive mobility.    Time 10     Period Weeks    Status On-going    Target Date 01/12/21      PT LONG TERM GOAL #5   Title Pt. will demonstrate FOTO outcome > or = 63 %    Time 10    Period Weeks    Status On-going    Target Date 01/12/21      PT LONG TERM GOAL #6   Title Pt. will demonstrate BERG > 45 to indicate reduced fall risk due to condition.    Time 10    Period Weeks    Status On-going    Target Date 01/12/21                   Plan - 12/22/20 1001     Clinical Impression Statement Pt. demonstrated improved quality of control in ascending/descending ramp and cub today vs. previous attempts with no instances of loss of balance noted in performance.  Dynamic stabilty continued to show defiicts c dual task( head movements, conversation).  Continued skilled PT services may benefit fall risk reduction.    Personal Factors and Comorbidities Comorbidity 3+    Comorbidities Chronic kidney disease, CAD,  diabetic neuropathy, GERD, Gout, HTN, hypothyroidism, history of prostate cancer, PVD    Dialysis, double upright brace on Rt and AFO on Lt    Stability/Clinical Decision Making Evolving/Moderate complexity    Rehab Potential --   fair to good   PT Frequency 2x / week    PT Duration Other (comment)   10 weeks   PT Treatment/Interventions ADLs/Self Care Home Management;Cryotherapy;Moist Heat;Balance training;Therapeutic exercise;Therapeutic activities;Functional mobility training;Stair training;Gait training;DME Instruction;Neuromuscular re-education;Patient/family education;Passive range of motion;Joint Manipulations;Splinting;Taping;Manual techniques;Orthotic Fit/Training    PT Next Visit Plan static and dynamic balance intervention and endurance to improve ambulation stability c dual task usage    PT Home Exercise Plan [email or web address]    Consulted and Agree with Plan of Care Patient             Patient will benefit from skilled therapeutic intervention in order to improve the following deficits and  impairments:  Abnormal gait, Decreased endurance, Hypomobility, Decreased strength, Decreased activity tolerance, Decreased balance, Decreased mobility, Difficulty walking, Impaired perceived functional ability, Improper body mechanics, Postural dysfunction, Impaired flexibility, Decreased coordination, Decreased range of motion  Visit Diagnosis: Pain in left foot  Pain in right foot  Muscle weakness (generalized)  Difficulty in walking, not elsewhere classified  Abnormal posture     Problem List Patient Active Problem List   Diagnosis Date Noted   Symptomatic anemia 09/13/2020   COVID-19 virus infection 09/13/2020   Pressure injury of skin 06/29/2020   Gross hematuria 06/16/2020   Typical atrial flutter ([place]) 03/24/2020   Bilateral pleural effusion 02/24/2020   Healthcare maintenance 01/28/2020   Shortness of breath 01/28/2020   History of partial ray amputation of fourth toe of right foot ([employer]) 09/08/2019   Ulcerated, foot, right, with necrosis of bone (HCC)    Chronic cough 06/25/2019   Cutaneous abscess of right foot    Subacute osteomyelitis of right foot ([place]) 12/14/2018   AKI (acute kidney injury) ([place]) 12/14/2018   ESRD (end stage renal disease) ([place]) 12/14/2018   Gait abnormality 08/24/2018   Diabetic neuropathy ([place]) 02/06/2018   Cervical myelopathy ([place]) 02/06/2018   Onychomycosis 10/30/2017   Other spondylosis with radiculopathy, cervical region 01/27/2017   Midfoot ulcer, right, limited to breakdown of skin ([place]) 11/15/2016   Lateral epicondylitis, left elbow 08/12/2016   Cellulitis of fifth toe of right foot 07/29/2016   Prostate cancer ([place]) 06/19/2016   Non-pressure chronic ulcer of other part of right foot limited to breakdown of skin ([place]) 04/04/2016   Cellulitis of leg, right 12/24/2015   Cellulitis of right lower extremity 12/24/2015   Gout 09/14/2012   HTN (hypertension) 09/14/2012   Hypothyroidism 09/14/2012   CAD (coronary artery disease)  09/14/2012   Diabetes mellitus ([place]) 09/14/2012   Hypercholesteremia    Neuropathy ([place])    Scot Jun, PT, DPT, OCS, ATC 12/22/20  10:04 AM    [facility] [address] [place], [place], [address] Phone: [phone]   Fax:  [phone]  Name: [name] MRN: [id number] Date of Birth: 07/26/1950

## 2020-12-27 ENCOUNTER — Ambulatory Visit (INDEPENDENT_AMBULATORY_CARE_PROVIDER_SITE_OTHER): Payer: Medicare Other | Admitting: Rehabilitative and Restorative Service Providers"

## 2020-12-27 ENCOUNTER — Encounter: Payer: Self-pay | Admitting: Rehabilitative and Restorative Service Providers"

## 2020-12-27 ENCOUNTER — Other Ambulatory Visit: Payer: Self-pay

## 2020-12-27 DIAGNOSIS — M79672 Pain in left foot: Secondary | ICD-10-CM | POA: Diagnosis not present

## 2020-12-27 DIAGNOSIS — R262 Difficulty in walking, not elsewhere classified: Secondary | ICD-10-CM | POA: Diagnosis not present

## 2020-12-27 DIAGNOSIS — M6281 Muscle weakness (generalized): Secondary | ICD-10-CM

## 2020-12-27 DIAGNOSIS — R293 Abnormal posture: Secondary | ICD-10-CM

## 2020-12-27 DIAGNOSIS — M79671 Pain in right foot: Secondary | ICD-10-CM

## 2020-12-27 NOTE — Therapy (Signed)
[facility] [address] [place], [place], [address] Phone: [phone]   Fax:  [phone]  Physical Therapy Treatment  Patient Details  Name: [name] MRN: [id number] Date of Birth: Apr 08, 1951 Referring Provider (PT): Dondra Prader NP   Encounter Date: 12/27/2020   PT End of Session - 12/27/20 1558     Visit Number 15    Number of Visits 20    Date for PT Re-Evaluation 01/12/21    Authorization Type Medicare, BCBS    Progress Note Due on Visit 20    PT Start Time 1548    PT Stop Time 1628    PT Time Calculation (min) 40 min    Equipment Utilized During Treatment Gait belt    Activity Tolerance Patient tolerated treatment well    Behavior During Therapy [employer] for tasks assessed/performed             Past Medical History:  Diagnosis Date   Ambulates with cane    straight cane   Cervical myelopathy ([place]) 02/06/2018   Chronic kidney disease    dailysis M W F- home   Complication of anesthesia    Coronary artery disease    Diabetes mellitus without complication ([place])    type2   Diabetic foot ulcer ([place])    Diabetic neuropathy ([place]) 02/06/2018   Gait abnormality 08/24/2018   GERD (gastroesophageal reflux disease)     01/06/20- not current   Gout    History of kidney stones    passed stones   Hypercholesteremia    Hypertension    Hypothyroidism    Neuromuscular disorder (HCC)    neuropathy left leg and bilateral feet   Neuropathy    PONV (postoperative nausea and vomiting)    Prostate cancer ([place])    PVD (peripheral vascular disease) ([place])    with amputations    Past Surgical History:  Procedure Laterality Date   A-FLUTTER ABLATION N/A 04/06/2020   Procedure: A-FLUTTER ABLATION;  Surgeon: [name], MD;  Location: [place] [facility];  Service: Cardiovascular;  Laterality: N/A;   AMPUTATION Left 12/25/2013   Procedure: AMPUTATION RAY LEFT 5TH RAY;  Surgeon: [name], MD;  [facility];  Service: Orthopedics;   Laterality: Left;   AMPUTATION Right 12/15/2018   Procedure: AMPUTATION OF 4TH AND 5TH TOES RIGHT FOOT;  Surgeon: [name], MD;  Location: [place];  Service: Orthopedics;  Laterality: Right;   APPLICATION OF WOUND VAC Right 12/15/2018   Procedure: APPLICATION OF WOUND VAC;  Surgeon: [name], MD;  Location: [place];  Service: Orthopedics;  Laterality: Right;   AV FISTULA PLACEMENT Left 03/25/2019   Procedure: LEFT ARM ARTERIOVENOUS (AV) FISTULA CREATION;  Surgeon: [name], MD;  Location: [place] [facility];  Service: Vascular;  Laterality: Left;   [place] Left 05/20/2019   Procedure: SECOND STAGE LEFT BASCILIC VEIN TRANSPOSITION;  Surgeon: [name], MD;  Location: [place] [facility];  Service: Vascular;  Laterality: Left;   CARDIAC CATHETERIZATION  02/17/2014   CHOLECYSTECTOMY     COLONOSCOPY  2011   in [place], [place]  2008   CYSTOSCOPY N/A 06/29/2020   Procedure: [place];  Surgeon: [name], MD;  Location: [place];  Service: Urology;  Laterality: N/A;   CYSTOSCOPY WITH FULGERATION Bilateral 06/17/2020   Procedure: CYSTOSCOPY,BILATERAL RETROGRADE, CLOT EVACUATION WITH FULGERATION OF THE BLADDER;  Surgeon: Abner Greenspan,  Bonna Gains, MD;  Location: [place];  Service: Urology;  Laterality: Bilateral;   CYSTOSCOPY WITH FULGERATION N/A 06/28/2020   Procedure: CYSTOSCOPY WITH CLOT EVACUATION AND FULGERATION OF BLEEDERS;  Surgeon: [name], MD;  Location: [place];  Service: Urology;  Laterality: N/A;  1 HR   I & D EXTREMITY Right 12/15/2018   Procedure: DEBRIDEMENT RIGHT FOOT;  Surgeon: [name], MD;  Location: [place];  Service: Orthopedics;  Laterality: Right;   I & D EXTREMITY Right 08/27/2019   Procedure: PARTIAL CUBOID EXCISION RIGHT FOOT;  Surgeon: [name], MD;  Location: [place];  Service: Orthopedics;  Laterality: Right;   I & D EXTREMITY Right 01/07/2020   Procedure: RIGHT FOOT  EXCISION INFECTED BONE;  Surgeon: [name], MD;  Location: [place];  Service: Orthopedics;  Laterality: Right;   NECK SURGERY     novemver 2019   TRANSURETHRAL RESECTION OF PROSTATE N/A 06/28/2020   Procedure: TRANSURETHRAL RESECTION OF THE PROSTATE (TURP);  Surgeon: [name], MD;  Location: [place];  Service: Urology;  Laterality: N/A;   WISDOM TOOTH EXTRACTION      There were no vitals filed for this visit.   Subjective Assessment - 12/27/20 1555     Subjective Pt. stated no specific complaints upon arrival today, indicated similar foot complaints to this point.    Patient is accompained by: Family member    Pertinent History Chronic kidney disease, CAD, diabetic neuropathy, GERD, Gout, HTN, hypothyroidism, history of prostate cancer, PVD    Dialysis, double upright brace on Rt and AFO on Lt    Limitations Standing;Walking;House hold activities    Patient Stated Goals Get to using cane    Currently in Pain? Yes    Pain Score 5     Pain Location Foot    Pain Orientation Right    Pain Descriptors / Indicators Aching;Sore    Pain Type Chronic pain    Pain Onset More than a month ago    Pain Frequency Constant    Aggravating Factors  constant    Pain Relieving Factors nothing specific                               [facility] Adult PT Treatment/Exercise - 12/27/20 0001       Therapeutic Activites    Other Therapeutic Activities ramp and curb (6 inch) x 6 c CGA and SPC use, sit to stand 21 inch table x 10 no UE      Neuro Re-ed    Neuro Re-ed Details  modified tandem stance 90 seconds x 1 bilateral c min A at times, feet hip width apart EO 2 mins, EC 2 mins, ambulation c head turns, up/down c verbal directions c CGA to min A c cane use 100 ft in clinic level surfaces      Ankle Exercises: Aerobic   Nustep Lvl 6 15 mins UE/LE      Ankle Exercises: Seated   Other Seated Ankle Exercises --                       PT Short Term Goals -  11/24/20 1131       PT SHORT TERM GOAL #1   Title Patient will demonstrate independent use of home exercise program to maintain progress from in clinic treatments.    Time 3    Period Weeks    Status Achieved  Target Date 11/24/20               PT Long Term Goals - 12/06/20 1627       PT LONG TERM GOAL #1   Title Patient will demonstrate/report pain at worst less than or equal to 2/10 to facilitate minimal limitation in daily activity secondary to pain symptoms.    Time 10    Period Weeks    Status On-going    Target Date 01/12/21      PT LONG TERM GOAL #2   Title Patient will demonstrate independent use of home exercise program to facilitate ability to maintain/progress functional gains from skilled physical therapy services.    Time 10    Period Weeks    Status On-going    Target Date 01/12/21      PT LONG TERM GOAL #3   Title Pt. will demonstrate ambulation c quad cane c mod independence community distances > 300 ft at gait speed > 2.6 ft for community ambulation.    Time 10    Period Weeks    Status On-going    Target Date 01/12/21      PT LONG TERM GOAL #4   Title Pt. will demonstrate bilateral hip flexion, knee flexion strength 5/5 to facilitate progressive mobility.    Time 10    Period Weeks    Status On-going    Target Date 01/12/21      PT LONG TERM GOAL #5   Title Pt. will demonstrate FOTO outcome > or = 63 %    Time 10    Period Weeks    Status On-going    Target Date 01/12/21      PT LONG TERM GOAL #6   Title Pt. will demonstrate BERG > 45 to indicate reduced fall risk due to condition.    Time 10    Period Weeks    Status On-going    Target Date 01/12/21                   Plan - 12/27/20 1603     Clinical Impression Statement Continued to implement dynamic stability and tactics to improve loss of balance stepping response and hip strategy response to improve balance control and safety with ambulation. Continued instances of  requiring min to mod assist at time for corrections of balance.    Personal Factors and Comorbidities Comorbidity 3+    Comorbidities Chronic kidney disease, CAD, diabetic neuropathy, GERD, Gout, HTN, hypothyroidism, history of prostate cancer, PVD    Dialysis, double upright brace on Rt and AFO on Lt    Stability/Clinical Decision Making Evolving/Moderate complexity    Rehab Potential --   fair to good   PT Frequency 2x / week    PT Duration Other (comment)   10 weeks   PT Treatment/Interventions ADLs/Self Care Home Management;Cryotherapy;Moist Heat;Balance training;Therapeutic exercise;Therapeutic activities;Functional mobility training;Stair training;Gait training;DME Instruction;Neuromuscular re-education;Patient/family education;Passive range of motion;Joint Manipulations;Splinting;Taping;Manual techniques;Orthotic Fit/Training    PT Next Visit Plan Continued static and dynamic balance intervention and endurance to improve ambulation stability c dual task usage    PT Home Exercise Plan [email or web address]    Consulted and Agree with Plan of Care Patient             Patient will benefit from skilled therapeutic intervention in order to improve the following deficits and impairments:  Abnormal gait, Decreased endurance, Hypomobility, Decreased strength, Decreased activity tolerance, Decreased balance, Decreased mobility, Difficulty walking, Impaired perceived functional ability, Improper  body mechanics, Postural dysfunction, Impaired flexibility, Decreased coordination, Decreased range of motion  Visit Diagnosis: Pain in left foot  Pain in right foot  Muscle weakness (generalized)  Difficulty in walking, not elsewhere classified  Abnormal posture     Problem List Patient Active Problem List   Diagnosis Date Noted   Symptomatic anemia 09/13/2020   COVID-19 virus infection 09/13/2020   Pressure injury of skin 06/29/2020   Gross hematuria 06/16/2020   Typical atrial flutter ([place])  03/24/2020   Bilateral pleural effusion 02/24/2020   Healthcare maintenance 01/28/2020   Shortness of breath 01/28/2020   History of partial ray amputation of fourth toe of right foot ([place]) 09/08/2019   Ulcerated, foot, right, with necrosis of bone ([place])    Chronic cough 06/25/2019   Cutaneous abscess of right foot    Subacute osteomyelitis of right foot ([place]) 12/14/2018   AKI (acute kidney injury) ([place]) 12/14/2018   ESRD (end stage renal disease) ([place]) 12/14/2018   Gait abnormality 08/24/2018   Diabetic neuropathy ([place]) 02/06/2018   Cervical myelopathy ([place]) 02/06/2018   Onychomycosis 10/30/2017   Other spondylosis with radiculopathy, cervical region 01/27/2017   Midfoot ulcer, right, limited to breakdown of skin ([place]) 11/15/2016   Lateral epicondylitis, left elbow 08/12/2016   Cellulitis of fifth toe of right foot 07/29/2016   Prostate cancer ([place]) 06/19/2016   Non-pressure chronic ulcer of other part of right foot limited to breakdown of skin ([place]) 04/04/2016   Cellulitis of leg, right 12/24/2015   Cellulitis of right lower extremity 12/24/2015   Gout 09/14/2012   HTN (hypertension) 09/14/2012   Hypothyroidism 09/14/2012   CAD (coronary artery disease) 09/14/2012   Diabetes mellitus ([place]) 09/14/2012   Hypercholesteremia    Neuropathy ([place])     Scot Jun, PT, DPT, OCS, ATC 12/27/20  4:31 PM   [facility] [address] [place], [place], [address] Phone: [phone]   Fax:  [phone]  Name: [name] MRN: [id number] Date of Birth: 15-Jun-1950

## 2020-12-29 ENCOUNTER — Encounter: Payer: Self-pay | Admitting: Rehabilitative and Restorative Service Providers"

## 2020-12-29 ENCOUNTER — Other Ambulatory Visit: Payer: Self-pay

## 2020-12-29 ENCOUNTER — Ambulatory Visit (INDEPENDENT_AMBULATORY_CARE_PROVIDER_SITE_OTHER): Payer: Medicare Other | Admitting: Rehabilitative and Restorative Service Providers"

## 2020-12-29 DIAGNOSIS — M79671 Pain in right foot: Secondary | ICD-10-CM

## 2020-12-29 DIAGNOSIS — R262 Difficulty in walking, not elsewhere classified: Secondary | ICD-10-CM

## 2020-12-29 DIAGNOSIS — R293 Abnormal posture: Secondary | ICD-10-CM

## 2020-12-29 DIAGNOSIS — M79672 Pain in left foot: Secondary | ICD-10-CM | POA: Diagnosis not present

## 2020-12-29 DIAGNOSIS — M6281 Muscle weakness (generalized): Secondary | ICD-10-CM

## 2020-12-29 NOTE — Therapy (Signed)
[facility] Therapy [address] [place], [place], [address] Phone: [phone]   Fax:  [phone]  Physical Therapy Treatment  Patient Details  Name: [name] MRN: [id number] Date of Birth: February 20, 1951 Referring Provider (PT): Dondra Prader NP   Encounter Date: 12/29/2020   PT End of Session - 12/29/20 0923     Visit Number 16    Number of Visits 20    Date for PT Re-Evaluation 01/12/21    Authorization Type Medicare, BCBS    Progress Note Due on Visit 34    PT Start Time 0928    PT Stop Time 1007    PT Time Calculation (min) 39 min    Equipment Utilized During Treatment Gait belt    Activity Tolerance Patient tolerated treatment well    Behavior During Therapy [facility] for tasks assessed/performed             Past Medical History:  Diagnosis Date   Ambulates with cane    straight cane   Cervical myelopathy ([place]) 02/06/2018   Chronic kidney disease    dailysis M W F- home   Complication of anesthesia    Coronary artery disease    Diabetes mellitus without complication ([place])    type2   Diabetic foot ulcer ([place])    Diabetic neuropathy ([place]) 02/06/2018   Gait abnormality 08/24/2018   GERD (gastroesophageal reflux disease)     01/06/20- not current   Gout    History of kidney stones    passed stones   Hypercholesteremia    Hypertension    Hypothyroidism    Neuromuscular disorder (HCC)    neuropathy left leg and bilateral feet   Neuropathy    PONV (postoperative nausea and vomiting)    Prostate cancer ([place])    PVD (peripheral vascular disease) ([place])    with amputations    Past Surgical History:  Procedure Laterality Date   A-FLUTTER ABLATION N/A 04/06/2020   Procedure: A-FLUTTER ABLATION;  Surgeon: [name], MD;  Location: [place];  Service: Cardiovascular;  Laterality: N/A;   AMPUTATION Left 12/25/2013   Procedure: AMPUTATION RAY LEFT 5TH RAY;  Surgeon: [name], MD;  Location: [facility];  Service: Orthopedics;   Laterality: Left;   AMPUTATION Right 12/15/2018   Procedure: AMPUTATION OF 4TH AND 5TH TOES RIGHT FOOT;  Surgeon: [name], MD;  Location: [facility];  Service: Orthopedics;  Laterality: Right;   APPLICATION OF WOUND VAC Right 12/15/2018   Procedure: APPLICATION OF WOUND VAC;  Surgeon: [name], MD;  Location: [facility];  Service: Orthopedics;  Laterality: Right;   AV FISTULA PLACEMENT Left 03/25/2019   Procedure: LEFT ARM ARTERIOVENOUS (AV) FISTULA CREATION;  Surgeon: [name], MD;  Location: [facility];  Service: Vascular;  Laterality: Left;   [name] Left 05/20/2019   Procedure: SECOND STAGE LEFT BASCILIC VEIN TRANSPOSITION;  Surgeon: [name], MD;  Location: [name];  Service: Vascular;  Laterality: Left;   CARDIAC CATHETERIZATION  02/17/2014   CHOLECYSTECTOMY     COLONOSCOPY  2011   in [place], [place]  2008   CYSTOSCOPY N/A 06/29/2020   Procedure: [place];  Surgeon: [name], MD;  Location: [facility];  Service: Urology;  Laterality: N/A;   CYSTOSCOPY WITH FULGERATION Bilateral 06/17/2020   Procedure: CYSTOSCOPY,BILATERAL RETROGRADE, CLOT EVACUATION WITH FULGERATION OF THE BLADDER;  Surgeon: Abner Greenspan,  Bonna Gains, MD;  Location: [facility];  Service: Urology;  Laterality: Bilateral;   CYSTOSCOPY WITH FULGERATION N/A 06/28/2020   Procedure: CYSTOSCOPY WITH CLOT EVACUATION AND FULGERATION OF BLEEDERS;  Surgeon: [name], MD;  Location: [facility];  Service: Urology;  Laterality: N/A;  1 HR   I & D EXTREMITY Right 12/15/2018   Procedure: DEBRIDEMENT RIGHT FOOT;  Surgeon: [name], MD;  Location: [facility];  Service: Orthopedics;  Laterality: Right;   I & D EXTREMITY Right 08/27/2019   Procedure: PARTIAL CUBOID EXCISION RIGHT FOOT;  Surgeon: [name], MD;  Location: [facility];  Service: Orthopedics;  Laterality: Right;   I & D EXTREMITY Right 01/07/2020   Procedure: RIGHT FOOT  EXCISION INFECTED BONE;  Surgeon: [name], MD;  Location: [place] [facility];  Service: Orthopedics;  Laterality: Right;   NECK SURGERY     novemver 2019   TRANSURETHRAL RESECTION OF PROSTATE N/A 06/28/2020   Procedure: TRANSURETHRAL RESECTION OF THE PROSTATE (TURP);  Surgeon: [name], MD;  Location: [facility];  Service: Urology;  Laterality: N/A;   WISDOM TOOTH EXTRACTION      There were no vitals filed for this visit.   Subjective Assessment - 12/29/20 0929     Subjective Pt. indicated he takes some rest after visit but doesn't feel too fatigued after.  Pt. stated foot stays similar and constant.    Patient is accompained by: Family member    Pertinent History Chronic kidney disease, CAD, diabetic neuropathy, GERD, Gout, HTN, hypothyroidism, history of prostate cancer, PVD    Dialysis, double upright brace on Rt and AFO on Lt    Limitations Standing;Walking;House hold activities    Patient Stated Goals Get to using cane    Currently in Pain? Yes    Pain Score 5     Pain Location Foot    Pain Orientation Right    Pain Descriptors / Indicators Aching;Sore    Pain Type Chronic pain    Pain Onset More than a month ago    Pain Frequency Constant    Aggravating Factors  constant    Pain Relieving Factors sometimes rest                [facility] PT Assessment - 12/29/20 0001       Assessment   Medical Diagnosis Z89.421 (ICD-10-CM) - History of partial ray amputation of fourth toe of right foot ([place])    Referring Provider (PT) Dondra Prader NP      Berg Balance Test   Sit to Stand Able to stand  independently using hands    Standing Unsupported Able to stand 2 minutes with supervision    Sitting with Back Unsupported but Feet Supported on Floor or Stool Able to sit safely and securely 2 minutes    Stand to Sit Controls descent by using hands    Transfers Able to transfer safely, definite need of hands    Standing Unsupported with Eyes Closed Able to stand 10 seconds with  supervision    Standing Unsupported with Feet Together Needs help to attain position but able to stand for 30 seconds with feet together    From Standing, Reach Forward with Outstretched Arm Can reach forward >5 cm safely (2")    From Standing Position, Pick up Object from Floor Able to pick up shoe, needs supervision    From Standing Position, Turn to Look Behind Over each Shoulder Needs supervision when turning    Turn 360 Degrees Needs  close supervision or verbal cueing    Standing Unsupported, Alternately Place Feet on Step/Stool Able to complete >2 steps/needs minimal assist    Standing Unsupported, One Foot in [place] help to step but can hold 15 seconds    Standing on One Leg Unable to try or needs assist to prevent fall    Total Score 29                           [facility] Adult PT Treatment/Exercise - 12/29/20 0001       Therapeutic Activites    Other Therapeutic Activities ramp and curb (6 inch) x 4 c CGA and quad cane, sit to stand 20 inch chair 2 x 5      Neuro Re-ed    Neuro Re-ed Details  modified tandem stance 1 min x 1 bilateral, feet together stance EO 1 min, feet hip width EO 30 sec, EC 30 sec, in // bars step over foam x 2 c 6 inch step up Rt hand on bar x 5 each LE leading,      Ankle Exercises: Aerobic   Nustep Lvl 6 10 mins UE/LE      Ankle Exercises: Seated   Other Seated Ankle Exercises --                       PT Short Term Goals - 11/24/20 1131       PT SHORT TERM GOAL #1   Title Patient will demonstrate independent use of home exercise program to maintain progress from in clinic treatments.    Time 3    Period Weeks    Status Achieved    Target Date 11/24/20               PT Long Term Goals - 12/29/20 0956       PT LONG TERM GOAL #1   Title Patient will demonstrate/report pain at worst less than or equal to 2/10 to facilitate minimal limitation in daily activity secondary to pain symptoms.    Time 10     Period Weeks    Status On-going    Target Date 01/12/21      PT LONG TERM GOAL #2   Title Patient will demonstrate independent use of home exercise program to facilitate ability to maintain/progress functional gains from skilled physical therapy services.    Time 10    Period Weeks    Status On-going    Target Date 01/12/21      PT LONG TERM GOAL #3   Title Pt. will demonstrate ambulation c quad cane c mod independence community distances > 300 ft at gait speed > 2.6 ft for community ambulation.    Time 10    Period Weeks    Status On-going    Target Date 01/12/21      PT LONG TERM GOAL #4   Title Pt. will demonstrate bilateral hip flexion, knee flexion strength 5/5 to facilitate progressive mobility.    Time 10    Period Weeks    Status On-going    Target Date 01/12/21      PT LONG TERM GOAL #5   Title Pt. will demonstrate FOTO outcome > or = 63 %    Time 10    Period Weeks    Status On-going    Target Date 01/12/21      PT LONG TERM GOAL #6   Title Pt. will  demonstrate BERG > 45 to indicate reduced fall risk due to condition.    Time 10    Period Weeks    Status On-going    Target Date 01/12/21                   Plan - 12/29/20 0953     Clinical Impression Statement Reassessment of Merrilee Jansky showed improvement of scoring but still well into fall risk category.  More instances noted today of loss of balance control in ambulation, usually observed in Rt leg stance positioning. Continued skilled PT services warranted to help improve stability in ambulation and movement c cane.  Initial use of quad cane was in Rt hand due to similar strength deficits in bilateral leg and Pt. reported more comfort using Rt hand.  As time as progressed, Rt leg deficits more evident and use of cane in Lt UE highly recommended.  Pt. reported uncomfortability c use today in trial.  Will plan for continued use in clinic to improve.    Personal Factors and Comorbidities Comorbidity 3+     Comorbidities Chronic kidney disease, CAD, diabetic neuropathy, GERD, Gout, HTN, hypothyroidism, history of prostate cancer, PVD    Dialysis, double upright brace on Rt and AFO on Lt    Stability/Clinical Decision Making Evolving/Moderate complexity    Rehab Potential --   fair to good   PT Frequency 2x / week    PT Duration Other (comment)   10 weeks   PT Treatment/Interventions ADLs/Self Care Home Management;Cryotherapy;Moist Heat;Balance training;Therapeutic exercise;Therapeutic activities;Functional mobility training;Stair training;Gait training;DME Instruction;Neuromuscular re-education;Patient/family education;Passive range of motion;Joint Manipulations;Splinting;Taping;Manual techniques;Orthotic Fit/Training    PT Next Visit Plan Static/dynamic balance intervention to improve ambulation stability c dual task usage.  Cane in [employer].    PT Home Exercise Plan [email or web address]    Consulted and Agree with Plan of Care Patient             Patient will benefit from skilled therapeutic intervention in order to improve the following deficits and impairments:  Abnormal gait, Decreased endurance, Hypomobility, Decreased strength, Decreased activity tolerance, Decreased balance, Decreased mobility, Difficulty walking, Impaired perceived functional ability, Improper body mechanics, Postural dysfunction, Impaired flexibility, Decreased coordination, Decreased range of motion  Visit Diagnosis: Pain in left foot  Pain in right foot  Muscle weakness (generalized)  Difficulty in walking, not elsewhere classified  Abnormal posture     Problem List Patient Active Problem List   Diagnosis Date Noted   Symptomatic anemia 09/13/2020   COVID-19 virus infection 09/13/2020   Pressure injury of skin 06/29/2020   Gross hematuria 06/16/2020   Typical atrial flutter ([place]) 03/24/2020   Bilateral pleural effusion 02/24/2020   Healthcare maintenance 01/28/2020   Shortness of breath 01/28/2020    History of partial ray amputation of fourth toe of right foot ([place]) 09/08/2019   Ulcerated, foot, right, with necrosis of bone (HCC)    Chronic cough 06/25/2019   Cutaneous abscess of right foot    Subacute osteomyelitis of right foot ([place]) 12/14/2018   AKI (acute kidney injury) ([place]) 12/14/2018   ESRD (end stage renal disease) ([place]) 12/14/2018   Gait abnormality 08/24/2018   Diabetic neuropathy ([place]) 02/06/2018   Cervical myelopathy ([place]) 02/06/2018   Onychomycosis 10/30/2017   Other spondylosis with radiculopathy, cervical region 01/27/2017   Midfoot ulcer, right, limited to breakdown of skin ([place]) 11/15/2016   Lateral epicondylitis, left elbow 08/12/2016   Cellulitis of fifth toe of right foot 07/29/2016   Prostate cancer (  [place]) 06/19/2016   Non-pressure chronic ulcer of other part of right foot limited to breakdown of skin ([place]) 04/04/2016   Cellulitis of leg, right 12/24/2015   Cellulitis of right lower extremity 12/24/2015   Gout 09/14/2012   HTN (hypertension) 09/14/2012   Hypothyroidism 09/14/2012   CAD (coronary artery disease) 09/14/2012   Diabetes mellitus ([name]) 09/14/2012   Hypercholesteremia    Neuropathy ([place])     Scot Jun, PT, DPT, OCS, ATC 12/29/20  10:11 AM    [facility] [address] [place], [place], [address] Phone: [phone]   Fax:  [phone]  Name: Yaseen Gilberg MRN: [id number] Date of Birth: Feb 22, 1951

## 2021-01-03 ENCOUNTER — Encounter: Payer: Self-pay | Admitting: Rehabilitative and Restorative Service Providers"

## 2021-01-03 ENCOUNTER — Other Ambulatory Visit: Payer: Self-pay

## 2021-01-03 ENCOUNTER — Ambulatory Visit (INDEPENDENT_AMBULATORY_CARE_PROVIDER_SITE_OTHER): Payer: Medicare Other | Admitting: Rehabilitative and Restorative Service Providers"

## 2021-01-03 DIAGNOSIS — R293 Abnormal posture: Secondary | ICD-10-CM

## 2021-01-03 DIAGNOSIS — M79672 Pain in left foot: Secondary | ICD-10-CM

## 2021-01-03 DIAGNOSIS — M6281 Muscle weakness (generalized): Secondary | ICD-10-CM

## 2021-01-03 DIAGNOSIS — R262 Difficulty in walking, not elsewhere classified: Secondary | ICD-10-CM

## 2021-01-03 DIAGNOSIS — M79671 Pain in right foot: Secondary | ICD-10-CM | POA: Diagnosis not present

## 2021-01-03 NOTE — Therapy (Signed)
[facility] [address] [place], [place], [address] Phone: [phone]   Fax:  [phone]  Physical Therapy Treatment  Patient Details  Name: [name] MRN: [id number] Date of Birth: Dec 28, 1950 Referring Provider (PT): Dondra Prader NP   Encounter Date: 01/03/2021   PT End of Session - 01/03/21 1600     Visit Number 17    Number of Visits 20    Date for PT Re-Evaluation 01/12/21    Authorization Type Medicare, BCBS    Progress Note Due on Visit 32    PT Start Time 1556    PT Stop Time 1636    PT Time Calculation (min) 40 min    Equipment Utilized During Treatment Gait belt    Activity Tolerance Patient tolerated treatment well    Behavior During Therapy [facility] for tasks assessed/performed             Past Medical History:  Diagnosis Date   Ambulates with cane    straight cane   Cervical myelopathy ([place]) 02/06/2018   Chronic kidney disease    dailysis M W F- home   Complication of anesthesia    Coronary artery disease    Diabetes mellitus without complication ([place])    type2   Diabetic foot ulcer ([place])    Diabetic neuropathy ([place]) 02/06/2018   Gait abnormality 08/24/2018   GERD (gastroesophageal reflux disease)     01/06/20- not current   Gout    History of kidney stones    passed stones   Hypercholesteremia    Hypertension    Hypothyroidism    Neuromuscular disorder (HCC)    neuropathy left leg and bilateral feet   Neuropathy    PONV (postoperative nausea and vomiting)    Prostate cancer ([place])    PVD (peripheral vascular disease) ([employer] [facility])    with amputations    Past Surgical History:  Procedure Laterality Date   A-FLUTTER ABLATION N/A 04/06/2020   Procedure: A-FLUTTER ABLATION;  Surgeon: [name], MD;  Location: [place] [facility];  Service: Cardiovascular;  Laterality: N/A;   AMPUTATION Left 12/25/2013   Procedure: AMPUTATION RAY LEFT 5TH RAY;  Surgeon: [name], MD;  [facility];  Service: Orthopedics;   Laterality: Left;   AMPUTATION Right 12/15/2018   Procedure: AMPUTATION OF 4TH AND 5TH TOES RIGHT FOOT;  Surgeon: [name], MD;  Location: [place];  Service: Orthopedics;  Laterality: Right;   APPLICATION OF WOUND VAC Right 12/15/2018   Procedure: APPLICATION OF WOUND VAC;  Surgeon: [name], MD;  Location: [place];  Service: Orthopedics;  Laterality: Right;   AV FISTULA PLACEMENT Left 03/25/2019   Procedure: LEFT ARM ARTERIOVENOUS (AV) FISTULA CREATION;  Surgeon: [name], MD;  Location: [place] [facility];  Service: Vascular;  Laterality: Left;   [place] Left 05/20/2019   Procedure: SECOND STAGE LEFT BASCILIC VEIN TRANSPOSITION;  Surgeon: [name], MD;  Location: [place] [facility];  Service: Vascular;  Laterality: Left;   CARDIAC CATHETERIZATION  02/17/2014   CHOLECYSTECTOMY     COLONOSCOPY  2011   in [place], [place]  2008   CYSTOSCOPY N/A 06/29/2020   Procedure: [place];  Surgeon: [name], MD;  Location: [place];  Service: Urology;  Laterality: N/A;   CYSTOSCOPY WITH FULGERATION Bilateral 06/17/2020   Procedure: CYSTOSCOPY,BILATERAL RETROGRADE, CLOT EVACUATION WITH FULGERATION OF THE BLADDER;  Surgeon: Abner Greenspan,  Bonna Gains, MD;  Location: [place];  Service: Urology;  Laterality: Bilateral;   CYSTOSCOPY WITH FULGERATION N/A 06/28/2020   Procedure: CYSTOSCOPY WITH CLOT EVACUATION AND FULGERATION OF BLEEDERS;  Surgeon: [name], MD;  Location: [place];  Service: Urology;  Laterality: N/A;  1 HR   I & D EXTREMITY Right 12/15/2018   Procedure: DEBRIDEMENT RIGHT FOOT;  Surgeon: [name], MD;  Location: [place];  Service: Orthopedics;  Laterality: Right;   I & D EXTREMITY Right 08/27/2019   Procedure: PARTIAL CUBOID EXCISION RIGHT FOOT;  Surgeon: [name], MD;  Location: [place];  Service: Orthopedics;  Laterality: Right;   I & D EXTREMITY Right 01/07/2020   Procedure: RIGHT FOOT  EXCISION INFECTED BONE;  Surgeon: [name], MD;  Location: [place];  Service: Orthopedics;  Laterality: Right;   NECK SURGERY     novemver 2019   TRANSURETHRAL RESECTION OF PROSTATE N/A 06/28/2020   Procedure: TRANSURETHRAL RESECTION OF THE PROSTATE (TURP);  Surgeon: [name], MD;  Location: [place];  Service: Urology;  Laterality: N/A;   WISDOM TOOTH EXTRACTION      There were no vitals filed for this visit.   Subjective Assessment - 01/03/21 1601     Subjective Pt. stated he did some practice using cane in Lt hand with fair control.    Patient is accompained by: Family member    Pertinent History Chronic kidney disease, CAD, diabetic neuropathy, GERD, Gout, HTN, hypothyroidism, history of prostate cancer, PVD    Dialysis, double upright brace on Rt and AFO on Lt    Limitations Standing;Walking;House hold activities    Patient Stated Goals Get to using cane    Currently in Pain? Yes    Pain Score 5     Pain Location Foot    Pain Orientation Right    Pain Descriptors / Indicators Aching;Burning;Sore    Pain Type Chronic pain    Pain Onset More than a month ago    Pain Frequency Constant    Aggravating Factors  "hurts all the time."    Pain Relieving Factors nothing                [facility] PT Assessment - 01/03/21 0001       Ambulation/Gait   Gait Comments quad cane in Lt UE c occasional cues for technique and placement, occasional incident of loss of balance requiring stepping.                           [place] Adult PT Treatment/Exercise - 01/03/21 0001       Neuro Re-ed    Neuro Re-ed Details  alt toe tapping Lt hand on rail x 10 bilateral 6 inch step CGA, feet hip width EO 30 sec x 2, EC 1 min, head turns Lt and Rt 30 sec x 2, quad cane in Lt hand step on foam c Rt leg x 2, over sticks x 2 Rt leg, cone weave x 3 course performed x 4 c CGA and one instance of min A      Ankle Exercises: Aerobic   Nustep Lvl 6 10 mins UE/LE      Ankle  Exercises: Standing   Other Standing Ankle Exercises fwd step up Lt hand on rail 6 inch x 10 bilateral CGA      Ankle Exercises: Machines for Strengthening   Cybex Leg Press 50 lbs 2 x 10 bilateral  PT Short Term Goals - 11/24/20 1131       PT SHORT TERM GOAL #1   Title Patient will demonstrate independent use of home exercise program to maintain progress from in clinic treatments.    Time 3    Period Weeks    Status Achieved    Target Date 11/24/20               PT Long Term Goals - 12/29/20 0956       PT LONG TERM GOAL #1   Title Patient will demonstrate/report pain at worst less than or equal to 2/10 to facilitate minimal limitation in daily activity secondary to pain symptoms.    Time 10    Period Weeks    Status On-going    Target Date 01/12/21      PT LONG TERM GOAL #2   Title Patient will demonstrate independent use of home exercise program to facilitate ability to maintain/progress functional gains from skilled physical therapy services.    Time 10    Period Weeks    Status On-going    Target Date 01/12/21      PT LONG TERM GOAL #3   Title Pt. will demonstrate ambulation c quad cane c mod independence community distances > 300 ft at gait speed > 2.6 ft for community ambulation.    Time 10    Period Weeks    Status On-going    Target Date 01/12/21      PT LONG TERM GOAL #4   Title Pt. will demonstrate bilateral hip flexion, knee flexion strength 5/5 to facilitate progressive mobility.    Time 10    Period Weeks    Status On-going    Target Date 01/12/21      PT LONG TERM GOAL #5   Title Pt. will demonstrate FOTO outcome > or = 63 %    Time 10    Period Weeks    Status On-going    Target Date 01/12/21      PT LONG TERM GOAL #6   Title Pt. will demonstrate BERG > 45 to indicate reduced fall risk due to condition.    Time 10    Period Weeks    Status On-going    Target Date 01/12/21                    Plan - 01/03/21 1632     Clinical Impression Statement Lt hand quad cane usage was improved c improved sequecing.  Still occaional loss of balance, primarily due to lateral sway on Rt leg or off of sequencing.  Continued skilled PT services indicated.    Personal Factors and Comorbidities Comorbidity 3+    Comorbidities Chronic kidney disease, CAD, diabetic neuropathy, GERD, Gout, HTN, hypothyroidism, history of prostate cancer, PVD    Dialysis, double upright brace on Rt and AFO on Lt    Stability/Clinical Decision Making Evolving/Moderate complexity    Rehab Potential --   fair to good   PT Frequency 2x / week    PT Duration Other (comment)   10 weeks   PT Treatment/Interventions ADLs/Self Care Home Management;Cryotherapy;Moist Heat;Balance training;Therapeutic exercise;Therapeutic activities;Functional mobility training;Stair training;Gait training;DME Instruction;Neuromuscular re-education;Patient/family education;Passive range of motion;Joint Manipulations;Splinting;Taping;Manual techniques;Orthotic Fit/Training    PT Next Visit Plan Static/dynamic balance intervention to improve ambulation stability c dual task usage.  Cane in [place] hand training continued in distracting environment.    PT Home Exercise Plan [id number]    Consulted and Agree with  Plan of Care Patient             Patient will benefit from skilled therapeutic intervention in order to improve the following deficits and impairments:  Abnormal gait, Decreased endurance, Hypomobility, Decreased strength, Decreased activity tolerance, Decreased balance, Decreased mobility, Difficulty walking, Impaired perceived functional ability, Improper body mechanics, Postural dysfunction, Impaired flexibility, Decreased coordination, Decreased range of motion  Visit Diagnosis: Pain in left foot  Pain in right foot  Muscle weakness (generalized)  Difficulty in walking, not elsewhere classified  Abnormal  posture     Problem List Patient Active Problem List   Diagnosis Date Noted   Symptomatic anemia 09/13/2020   COVID-19 virus infection 09/13/2020   Pressure injury of skin 06/29/2020   Gross hematuria 06/16/2020   Typical atrial flutter ([place]) 03/24/2020   Bilateral pleural effusion 02/24/2020   Healthcare maintenance 01/28/2020   Shortness of breath 01/28/2020   History of partial ray amputation of fourth toe of right foot ([place]) 09/08/2019   Ulcerated, foot, right, with necrosis of bone (HCC)    Chronic cough 06/25/2019   Cutaneous abscess of right foot    Subacute osteomyelitis of right foot ([place]) 12/14/2018   AKI (acute kidney injury) ([place]) 12/14/2018   ESRD (end stage renal disease) ([place]) 12/14/2018   Gait abnormality 08/24/2018   Diabetic neuropathy ([name]) 02/06/2018   Cervical myelopathy ([place]) 02/06/2018   Onychomycosis 10/30/2017   Other spondylosis with radiculopathy, cervical region 01/27/2017   Midfoot ulcer, right, limited to breakdown of skin ([place]) 11/15/2016   Lateral epicondylitis, left elbow 08/12/2016   Cellulitis of fifth toe of right foot 07/29/2016   Prostate cancer ([place]) 06/19/2016   Non-pressure chronic ulcer of other part of right foot limited to breakdown of skin ([place]) 04/04/2016   Cellulitis of leg, right 12/24/2015   Cellulitis of right lower extremity 12/24/2015   Gout 09/14/2012   HTN (hypertension) 09/14/2012   Hypothyroidism 09/14/2012   CAD (coronary artery disease) 09/14/2012   Diabetes mellitus ([place]) 09/14/2012   Hypercholesteremia    Neuropathy ([place])     Scot Jun, PT, DPT, OCS, ATC 01/03/21  4:37 PM    [facility] [address] [place], [place], [address] Phone: [phone]   Fax:  [phone]  Name: [name] MRN: [id number] Date of Birth: [birth year]

## 2021-01-05 ENCOUNTER — Encounter: Payer: Self-pay | Admitting: Rehabilitative and Restorative Service Providers"

## 2021-01-05 ENCOUNTER — Ambulatory Visit (INDEPENDENT_AMBULATORY_CARE_PROVIDER_SITE_OTHER): Payer: Medicare Other | Admitting: Rehabilitative and Restorative Service Providers"

## 2021-01-05 ENCOUNTER — Other Ambulatory Visit: Payer: Self-pay

## 2021-01-05 DIAGNOSIS — M79672 Pain in left foot: Secondary | ICD-10-CM | POA: Diagnosis not present

## 2021-01-05 DIAGNOSIS — M6281 Muscle weakness (generalized): Secondary | ICD-10-CM

## 2021-01-05 DIAGNOSIS — R262 Difficulty in walking, not elsewhere classified: Secondary | ICD-10-CM | POA: Diagnosis not present

## 2021-01-05 DIAGNOSIS — M79671 Pain in right foot: Secondary | ICD-10-CM

## 2021-01-05 DIAGNOSIS — R293 Abnormal posture: Secondary | ICD-10-CM

## 2021-01-05 NOTE — Therapy (Signed)
[facility] [address] [place], [place], [address] Phone: [phone]   Fax:  [phone]  Physical Therapy Treatment  Patient Details  Name: [name] MRN: [id number] Date of Birth: 1950/12/03 Referring Provider (PT): Dondra Prader NP   Encounter Date: 01/05/2021   PT End of Session - 01/05/21 0938     Visit Number 18    Number of Visits 20    Date for PT Re-Evaluation 01/12/21    Authorization Type Medicare, BCBS    Progress Note Due on Visit 20    PT Start Time 0930    PT Stop Time 1010    PT Time Calculation (min) 40 min    Equipment Utilized During Treatment Gait belt    Activity Tolerance Patient tolerated treatment well    Behavior During Therapy [facility] for tasks assessed/performed             Past Medical History:  Diagnosis Date   Ambulates with cane    straight cane   Cervical myelopathy ([place]) 02/06/2018   Chronic kidney disease    dailysis M W F- home   Complication of anesthesia    Coronary artery disease    Diabetes mellitus without complication ([place])    type2   Diabetic foot ulcer ([place])    Diabetic neuropathy ([place]) 02/06/2018   Gait abnormality 08/24/2018   GERD (gastroesophageal reflux disease)     01/06/20- not current   Gout    History of kidney stones    passed stones   Hypercholesteremia    Hypertension    Hypothyroidism    Neuromuscular disorder (HCC)    neuropathy left leg and bilateral feet   Neuropathy    PONV (postoperative nausea and vomiting)    Prostate cancer ([place])    PVD (peripheral vascular disease) ([place])    with amputations    Past Surgical History:  Procedure Laterality Date   A-FLUTTER ABLATION N/A 04/06/2020   Procedure: A-FLUTTER ABLATION;  Surgeon: [name], MD;  Location: [place] [facility];  Service: Cardiovascular;  Laterality: N/A;   AMPUTATION Left 12/25/2013   Procedure: AMPUTATION RAY LEFT 5TH RAY;  Surgeon: [name], MD;  [facility];  Service: Orthopedics;   Laterality: Left;   AMPUTATION Right 12/15/2018   Procedure: AMPUTATION OF 4TH AND 5TH TOES RIGHT FOOT;  Surgeon: [name], MD;  Location: [place];  Service: Orthopedics;  Laterality: Right;   APPLICATION OF WOUND VAC Right 12/15/2018   Procedure: APPLICATION OF WOUND VAC;  Surgeon: [name], MD;  Location: [place];  Service: Orthopedics;  Laterality: Right;   AV FISTULA PLACEMENT Left 03/25/2019   Procedure: LEFT ARM ARTERIOVENOUS (AV) FISTULA CREATION;  Surgeon: [name], MD;  Location: [place] [facility];  Service: Vascular;  Laterality: Left;   [place] Left 05/20/2019   Procedure: SECOND STAGE LEFT BASCILIC VEIN TRANSPOSITION;  Surgeon: [name], MD;  Location: [place] [facility];  Service: Vascular;  Laterality: Left;   CARDIAC CATHETERIZATION  02/17/2014   CHOLECYSTECTOMY     COLONOSCOPY  2011   in [place], [place]  2008   CYSTOSCOPY N/A 06/29/2020   Procedure: [place];  Surgeon: [name], MD;  Location: [place];  Service: Urology;  Laterality: N/A;   CYSTOSCOPY WITH FULGERATION Bilateral 06/17/2020   Procedure: CYSTOSCOPY,BILATERAL RETROGRADE, CLOT EVACUATION WITH FULGERATION OF THE BLADDER;  Surgeon: Abner Greenspan,  Bonna Gains, MD;  Location: [place];  Service: Urology;  Laterality: Bilateral;   CYSTOSCOPY WITH FULGERATION N/A 06/28/2020   Procedure: CYSTOSCOPY WITH CLOT EVACUATION AND FULGERATION OF BLEEDERS;  Surgeon: [name], MD;  Location: [place];  Service: Urology;  Laterality: N/A;  1 HR   I & D EXTREMITY Right 12/15/2018   Procedure: DEBRIDEMENT RIGHT FOOT;  Surgeon: [name], MD;  Location: [place];  Service: Orthopedics;  Laterality: Right;   I & D EXTREMITY Right 08/27/2019   Procedure: PARTIAL CUBOID EXCISION RIGHT FOOT;  Surgeon: [name], MD;  Location: [place];  Service: Orthopedics;  Laterality: Right;   I & D EXTREMITY Right 01/07/2020   Procedure: RIGHT FOOT  EXCISION INFECTED BONE;  Surgeon: [name], MD;  Location: [place];  Service: Orthopedics;  Laterality: Right;   NECK SURGERY     novemver 2019   TRANSURETHRAL RESECTION OF PROSTATE N/A 06/28/2020   Procedure: TRANSURETHRAL RESECTION OF THE PROSTATE (TURP);  Surgeon: [name], MD;  Location: [place];  Service: Urology;  Laterality: N/A;   WISDOM TOOTH EXTRACTION      There were no vitals filed for this visit.   Subjective Assessment - 01/05/21 0937     Subjective Pt. indicated similar overall foot complaints.  No real changes from last visit in mobility.    Patient is accompained by: Family member    Pertinent History Chronic kidney disease, CAD, diabetic neuropathy, GERD, Gout, HTN, hypothyroidism, history of prostate cancer, PVD    Dialysis, double upright brace on Rt and AFO on Lt    Limitations Standing;Walking;House hold activities    Patient Stated Goals Get to using cane    Currently in Pain? Yes    Pain Score 5     Pain Location Foot    Pain Orientation Right    Pain Descriptors / Indicators Aching;Burning;Sore    Pain Type Chronic pain    Pain Onset More than a month ago    Pain Frequency Constant    Aggravating Factors  constant    Pain Relieving Factors nothing                               [facility] Adult PT Treatment/Exercise - 01/05/21 0001       Therapeutic Activites    Other Therapeutic Activities ramp and curb (6 inch) x 8 c CGA and quad cane in Lt UE c min A required at times to prevent loss of balance.  Quad cane Lt UE use for ambulation endurance 50 ft, 150 ft c SBA      Neuro Re-ed    Neuro Re-ed Details  lateral stepping 10 ft x 5 each way in bars bilateral hand assist, tandem stance modified  half foot forward 1 min x 1 bilateral occasional min A required, BIG inspired fwd stepping c CGA/Lt hand on bar x 10 bilateral, retro step x 10 bilateral      Ankle Exercises: Aerobic   Nustep Lvl 6 10 mins UE/LE      Ankle Exercises:  Machines for Strengthening   Cybex Leg Press 62 lbs 2 x 15 double leg, single leg 37 lbs 2 x 10 bilateral                       PT Short Term Goals - 11/24/20 1131       PT SHORT TERM GOAL #1  Title Patient will demonstrate independent use of home exercise program to maintain progress from in clinic treatments.    Time 3    Period Weeks    Status Achieved    Target Date 11/24/20               PT Long Term Goals - 12/29/20 0956       PT LONG TERM GOAL #1   Title Patient will demonstrate/report pain at worst less than or equal to 2/10 to facilitate minimal limitation in daily activity secondary to pain symptoms.    Time 10    Period Weeks    Status On-going    Target Date 01/12/21      PT LONG TERM GOAL #2   Title Patient will demonstrate independent use of home exercise program to facilitate ability to maintain/progress functional gains from skilled physical therapy services.    Time 10    Period Weeks    Status On-going    Target Date 01/12/21      PT LONG TERM GOAL #3   Title Pt. will demonstrate ambulation c quad cane c mod independence community distances > 300 ft at gait speed > 2.6 ft for community ambulation.    Time 10    Period Weeks    Status On-going    Target Date 01/12/21      PT LONG TERM GOAL #4   Title Pt. will demonstrate bilateral hip flexion, knee flexion strength 5/5 to facilitate progressive mobility.    Time 10    Period Weeks    Status On-going    Target Date 01/12/21      PT LONG TERM GOAL #5   Title Pt. will demonstrate FOTO outcome > or = 63 %    Time 10    Period Weeks    Status On-going    Target Date 01/12/21      PT LONG TERM GOAL #6   Title Pt. will demonstrate BERG > 45 to indicate reduced fall risk due to condition.    Time 10    Period Weeks    Status On-going    Target Date 01/12/21                   Plan - 01/05/21 1010     Clinical Impression Statement Pt. currently demonstrated level  surface quad cane ambulation satisfactory but any enviornmental distraction, uneven surface proposed enough challenge for clincian to provide CGA or min A for safety.  Pt. at risk for reversion of gains without continued skilled PT services at this time.    Personal Factors and Comorbidities Comorbidity 3+    Comorbidities Chronic kidney disease, CAD, diabetic neuropathy, GERD, Gout, HTN, hypothyroidism, history of prostate cancer, PVD    Dialysis, double upright brace on Rt and AFO on Lt    Stability/Clinical Decision Making Evolving/Moderate complexity    Rehab Potential --   fair to good   PT Frequency 2x / week    PT Duration Other (comment)   10 weeks   PT Treatment/Interventions ADLs/Self Care Home Management;Cryotherapy;Moist Heat;Balance training;Therapeutic exercise;Therapeutic activities;Functional mobility training;Stair training;Gait training;DME Instruction;Neuromuscular re-education;Patient/family education;Passive range of motion;Joint Manipulations;Splinting;Taping;Manual techniques;Orthotic Fit/Training    PT Next Visit Plan Static/dynamic balance intervention to improve ambulation stability, LE strengthening.   - USE KX    PT Home Exercise Plan [id number]    Consulted and Agree with Plan of Care Patient             Patient  will benefit from skilled therapeutic intervention in order to improve the following deficits and impairments:  Abnormal gait, Decreased endurance, Hypomobility, Decreased strength, Decreased activity tolerance, Decreased balance, Decreased mobility, Difficulty walking, Impaired perceived functional ability, Improper body mechanics, Postural dysfunction, Impaired flexibility, Decreased coordination, Decreased range of motion  Visit Diagnosis: Pain in left foot  Pain in right foot  Muscle weakness (generalized)  Difficulty in walking, not elsewhere classified  Abnormal posture     Problem List Patient Active Problem List   Diagnosis Date Noted    Symptomatic anemia 09/13/2020   COVID-19 virus infection 09/13/2020   Pressure injury of skin 06/29/2020   Gross hematuria 06/16/2020   Typical atrial flutter ([place]) 03/24/2020   Bilateral pleural effusion 02/24/2020   Healthcare maintenance 01/28/2020   Shortness of breath 01/28/2020   History of partial ray amputation of fourth toe of right foot ([place]) 09/08/2019   Ulcerated, foot, right, with necrosis of bone (HCC)    Chronic cough 06/25/2019   Cutaneous abscess of right foot    Subacute osteomyelitis of right foot ([place]) 12/14/2018   AKI (acute kidney injury) ([place]) 12/14/2018   ESRD (end stage renal disease) ([place]) 12/14/2018   Gait abnormality 08/24/2018   Diabetic neuropathy ([place]) 02/06/2018   Cervical myelopathy ([place]) 02/06/2018   Onychomycosis 10/30/2017   Other spondylosis with radiculopathy, cervical region 01/27/2017   Midfoot ulcer, right, limited to breakdown of skin ([place]) 11/15/2016   Lateral epicondylitis, left elbow 08/12/2016   Cellulitis of fifth toe of right foot 07/29/2016   Prostate cancer ([place]) 06/19/2016   Non-pressure chronic ulcer of other part of right foot limited to breakdown of skin ([place]) 04/04/2016   Cellulitis of leg, right 12/24/2015   Cellulitis of right lower extremity 12/24/2015   Gout 09/14/2012   HTN (hypertension) 09/14/2012   Hypothyroidism 09/14/2012   CAD (coronary artery disease) 09/14/2012   Diabetes mellitus ([place]) 09/14/2012   Hypercholesteremia    Neuropathy ([place])    Scot Jun, PT, DPT, OCS, ATC 01/05/21  10:13 AM    [facility] [address] [place], [place], [address] Phone: [phone]   Fax:  [phone]  Name: Rajvir Ernster MRN: [id number] Date of Birth: 1950-09-04

## 2021-01-10 ENCOUNTER — Encounter: Payer: Medicare Other | Admitting: Rehabilitative and Restorative Service Providers"

## 2021-01-11 ENCOUNTER — Ambulatory Visit: Payer: Medicare Other | Admitting: Orthopedic Surgery

## 2021-01-12 ENCOUNTER — Other Ambulatory Visit: Payer: Self-pay

## 2021-01-12 ENCOUNTER — Encounter: Payer: Self-pay | Admitting: Rehabilitative and Restorative Service Providers"

## 2021-01-12 ENCOUNTER — Ambulatory Visit (INDEPENDENT_AMBULATORY_CARE_PROVIDER_SITE_OTHER): Payer: Medicare Other | Admitting: Rehabilitative and Restorative Service Providers"

## 2021-01-12 DIAGNOSIS — M79672 Pain in left foot: Secondary | ICD-10-CM | POA: Diagnosis not present

## 2021-01-12 DIAGNOSIS — R262 Difficulty in walking, not elsewhere classified: Secondary | ICD-10-CM | POA: Diagnosis not present

## 2021-01-12 DIAGNOSIS — M79671 Pain in right foot: Secondary | ICD-10-CM | POA: Diagnosis not present

## 2021-01-12 DIAGNOSIS — R293 Abnormal posture: Secondary | ICD-10-CM

## 2021-01-12 DIAGNOSIS — M6281 Muscle weakness (generalized): Secondary | ICD-10-CM | POA: Diagnosis not present

## 2021-01-12 NOTE — Therapy (Signed)
[facility] [address] [place], [place], [address] Phone: [phone]   Fax:  [phone]  Physical Therapy Treatment/Recertification  Patient Details  Name: [name] MRN: [id number] Date of Birth: [birth year] Referring Provider (PT): Dondra Prader NP   Encounter Date: 01/12/2021  Progress Note Reporting Period 12/06/2020 to 01/12/2021  See note below for Objective Data and Assessment of Progress/Goals.       PT End of Session - 01/12/21 1001     Visit Number 19    Number of Visits 30    Date for PT Re-Evaluation 01/12/21    Authorization Type Medicare, BCBS - KX required    Progress Note Due on Visit 82    PT Start Time 0920    PT Stop Time 1000    PT Time Calculation (min) 40 min    Equipment Utilized During Treatment Gait belt    Activity Tolerance Patient tolerated treatment well    Behavior During Therapy WFL for tasks assessed/performed             Past Medical History:  Diagnosis Date   Ambulates with cane    straight cane   Cervical myelopathy ([place]) 02/06/2018   Chronic kidney disease    dailysis M W F- home   Complication of anesthesia    Coronary artery disease    Diabetes mellitus without complication ([place])    type2   Diabetic foot ulcer ([place])    Diabetic neuropathy ([place]) 02/06/2018   Gait abnormality 08/24/2018   GERD (gastroesophageal reflux disease)     01/06/20- not current   Gout    History of kidney stones    passed stones   Hypercholesteremia    Hypertension    Hypothyroidism    Neuromuscular disorder (HCC)    neuropathy left leg and bilateral feet   Neuropathy    PONV (postoperative nausea and vomiting)    Prostate cancer ([place])    PVD (peripheral vascular disease) ([place])    with amputations    Past Surgical History:  Procedure Laterality Date   A-FLUTTER ABLATION N/A 04/06/2020   Procedure: A-FLUTTER ABLATION;  Surgeon: [name], MD;  Location: [facility];  Service:  Cardiovascular;  Laterality: N/A;   AMPUTATION Left 12/25/2013   Procedure: AMPUTATION RAY LEFT 5TH RAY;  Surgeon: [name], MD;  [facility];  Service: Orthopedics;  Laterality: Left;   AMPUTATION Right 12/15/2018   Procedure: AMPUTATION OF 4TH AND 5TH TOES RIGHT FOOT;  Surgeon: [name], MD;  Location: [facility];  Service: Orthopedics;  Laterality: Right;   APPLICATION OF WOUND VAC Right 12/15/2018   Procedure: APPLICATION OF WOUND VAC;  Surgeon: [name], MD;  Location: [facility];  Service: Orthopedics;  Laterality: Right;   AV FISTULA PLACEMENT Left 03/25/2019   Procedure: LEFT ARM ARTERIOVENOUS (AV) FISTULA CREATION;  Surgeon: [name], MD;  Location: [facility];  Service: Vascular;  Laterality: Left;   [place] Left 05/20/2019   Procedure: SECOND STAGE LEFT BASCILIC VEIN TRANSPOSITION;  Surgeon: [name], MD;  Location: [facility];  Service: Vascular;  Laterality: Left;   CARDIAC CATHETERIZATION  02/17/2014   CHOLECYSTECTOMY     COLONOSCOPY  2011   in [place], [place]  2008   CYSTOSCOPY N/A 06/29/2020   Procedure: [place];  Surgeon: [name], MD;  Location: [facility];  Service:  Urology;  Laterality: N/A;   CYSTOSCOPY WITH FULGERATION Bilateral 06/17/2020   Procedure: CYSTOSCOPY,BILATERAL RETROGRADE, CLOT EVACUATION WITH FULGERATION OF THE BLADDER;  Surgeon: Janith Lima, MD;  Location: [facility];  Service: Urology;  Laterality: Bilateral;   CYSTOSCOPY WITH FULGERATION N/A 06/28/2020   Procedure: CYSTOSCOPY WITH CLOT EVACUATION AND FULGERATION OF BLEEDERS;  Surgeon: [name], MD;  Location: [facility];  Service: Urology;  Laterality: N/A;  1 HR   I & D EXTREMITY Right 12/15/2018   Procedure: DEBRIDEMENT RIGHT FOOT;  Surgeon: [name], MD;  Location: [facility];  Service: Orthopedics;  Laterality: Right;   I & D EXTREMITY Right 08/27/2019   Procedure: PARTIAL  CUBOID EXCISION RIGHT FOOT;  Surgeon: [name], MD;  Location: [facility];  Service: Orthopedics;  Laterality: Right;   I & D EXTREMITY Right 01/07/2020   Procedure: RIGHT FOOT EXCISION INFECTED BONE;  Surgeon: [name], MD;  Location: [facility];  Service: Orthopedics;  Laterality: Right;   NECK SURGERY     novemver 2019   TRANSURETHRAL RESECTION OF PROSTATE N/A 06/28/2020   Procedure: TRANSURETHRAL RESECTION OF THE PROSTATE (TURP);  Surgeon: [name], MD;  Location: [facility];  Service: Urology;  Laterality: N/A;   WISDOM TOOTH EXTRACTION      There were no vitals filed for this visit.   Subjective Assessment - 01/12/21 0932     Subjective No changes reported in foot pain complaints.  Pt. stated he was getting a bit more used to cane in Lt hand use.    Patient is accompained by: Family member    Pertinent History Chronic kidney disease, CAD, diabetic neuropathy, GERD, Gout, HTN, hypothyroidism, history of prostate cancer, PVD    Dialysis, double upright brace on Rt and AFO on Lt    Limitations Standing;Walking;House hold activities    Patient Stated Goals Get to using cane    Currently in Pain? Yes    Pain Score 5     Pain Location Foot    Pain Orientation Right    Pain Descriptors / Indicators Aching;Burning;Sore    Pain Type Chronic pain    Pain Onset More than a month ago    Pain Frequency Constant    Aggravating Factors  constant    Pain Relieving Factors nothing specific reported                [facility] PT Assessment - 01/12/21 0001       Assessment   Medical Diagnosis Z89.421 (ICD-10-CM) - History of partial ray amputation of fourth toe of right foot ([place])    Referring Provider (PT) Dondra Prader NP      Functional Tests   Functional tests Sit to Stand      Sit to Stand   Comments Unable to perform s UE assist from 18 inch chair      Strength   Right Hip Flexion 4/5    Left Hip Flexion 4/5    Right Knee Flexion 4/5    Right Knee Extension 5/5    Left  Knee Flexion 4/5    Left Knee Extension 5/5      Ambulation/Gait   Ambulation/Gait Assistance 6: Modified independent (Device/Increase time)    Assistive device Other (Comment)   quad cane Lt UE     Berg Balance Test   Berg comment: score 29 carried over from 12/29/2020 visit  [facility] Adult PT Treatment/Exercise - 01/12/21 0001       Ambulation/Gait   Gait velocity 1.46 ft/sec (20 ft distance test) c quad cane in Lt hand.  Rollator 2.19 ft/sec      Therapeutic Activites    Other Therapeutic Activities ramp and curb (6 inch) x 2 c Quad cane Lt UE, SBA      Neuro Re-ed    Neuro Re-ed Details  reactive stepping fwd, back, side each way x 10 each bilateral c min A required moderately to help prevent full loss of balance, tband rows green 20x, chest press 20x c CGA, min A at times c feet together balance      Ankle Exercises: Aerobic   Nustep Lvl 6 10 mins UE/LE                       PT Short Term Goals - 11/24/20 1131       PT SHORT TERM GOAL #1   Title Patient will demonstrate independent use of home exercise program to maintain progress from in clinic treatments.    Time 3    Period Weeks    Status Achieved    Target Date 11/24/20               PT Long Term Goals - 01/12/21 1008       PT LONG TERM GOAL #1   Title Patient will demonstrate/report pain at worst less than or equal to 2/10 to facilitate minimal limitation in daily activity secondary to pain symptoms.    Time 8    Period Weeks    Status Revised    Target Date 03/09/21      PT LONG TERM GOAL #2   Title Patient will demonstrate independent use of home exercise program to facilitate ability to maintain/progress functional gains from skilled physical therapy services.    Time 8    Period Weeks    Status Revised    Target Date 03/09/21      PT LONG TERM GOAL #3   Title Pt. will demonstrate ambulation c quad cane c mod independence community  distances > 300 ft at gait speed > 2.6 ft for community ambulation.    Time 8    Period Weeks    Status Revised    Target Date 03/09/21      PT LONG TERM GOAL #4   Title Pt. will demonstrate bilateral hip flexion, knee flexion strength 5/5 to facilitate progressive mobility.    Time 8    Period Weeks    Status Revised    Target Date 03/09/21      PT LONG TERM GOAL #5   Title Pt. will demonstrate FOTO outcome > or = 63 %    Time 8    Period Weeks    Status Revised    Target Date 03/09/21      PT LONG TERM GOAL #6   Title Pt. will demonstrate BERG > 45 to indicate reduced fall risk due to condition.    Time 8    Period Weeks    Status Revised    Target Date 03/09/21                   Plan - 01/12/21 0934     Clinical Impression Statement Pt. has attended 19 visits overall during course of treatment.  Continued Rt foot complaints noted from amputation history.  See objective data for updated information. Gait speed  assessment c quad cane was below fall risk threshold (1.8 ft/sec for fall risk threshold).  Assessment c rollator at 2.19 ft/sec (improved compared to evaluation).  Pt. demonstrated continued medical necessity for skilled PT services to improve hip and stepping strategy and overall balance control to facilitate reduced fall risk to promote safety in household and community ambulation.  Extension and new recertification required today.    Personal Factors and Comorbidities Comorbidity 3+    Comorbidities Chronic kidney disease, CAD, diabetic neuropathy, GERD, Gout, HTN, hypothyroidism, history of prostate cancer, PVD    Dialysis, double upright brace on Rt and AFO on Lt    Stability/Clinical Decision Making Evolving/Moderate complexity    Rehab Potential --   fair to good   PT Frequency 2x / week    PT Duration 8 weeks    PT Treatment/Interventions ADLs/Self Care Home Management;Cryotherapy;Moist Heat;Balance training;Therapeutic exercise;Therapeutic  activities;Functional mobility training;Stair training;Gait training;DME Instruction;Neuromuscular re-education;Patient/family education;Passive range of motion;Joint Manipulations;Splinting;Taping;Manual techniques;Orthotic Fit/Training    PT Next Visit Plan HIp and stepping strategy improvements  - USE KX    PT Home Exercise Plan [id number]    Consulted and Agree with Plan of Care Patient             Patient will benefit from skilled therapeutic intervention in order to improve the following deficits and impairments:  Abnormal gait, Decreased endurance, Hypomobility, Decreased strength, Decreased activity tolerance, Decreased balance, Decreased mobility, Difficulty walking, Impaired perceived functional ability, Improper body mechanics, Postural dysfunction, Impaired flexibility, Decreased coordination, Decreased range of motion  Visit Diagnosis: Pain in left foot - Plan: PT plan of care cert/re-cert  Pain in right foot - Plan: PT plan of care cert/re-cert  Muscle weakness (generalized) - Plan: PT plan of care cert/re-cert  Difficulty in walking, not elsewhere classified - Plan: PT plan of care cert/re-cert  Abnormal posture - Plan: PT plan of care cert/re-cert     Problem List Patient Active Problem List   Diagnosis Date Noted   Symptomatic anemia 09/13/2020   COVID-19 virus infection 09/13/2020   Pressure injury of skin 06/29/2020   Gross hematuria 06/16/2020   Typical atrial flutter ([place]) 03/24/2020   Bilateral pleural effusion 02/24/2020   Healthcare maintenance 01/28/2020   Shortness of breath 01/28/2020   History of partial ray amputation of fourth toe of right foot ([place]) 09/08/2019   Ulcerated, foot, right, with necrosis of bone (HCC)    Chronic cough 06/25/2019   Cutaneous abscess of right foot    Subacute osteomyelitis of right foot ([place]) 12/14/2018   AKI (acute kidney injury) ([place]) 12/14/2018   ESRD (end stage renal disease) ([place]) 12/14/2018   Gait abnormality  08/24/2018   Diabetic neuropathy ([place]) 02/06/2018   Cervical myelopathy ([place]) 02/06/2018   Onychomycosis 10/30/2017   Other spondylosis with radiculopathy, cervical region 01/27/2017   Midfoot ulcer, right, limited to breakdown of skin ([place]) 11/15/2016   Lateral epicondylitis, left elbow 08/12/2016   Cellulitis of fifth toe of right foot 07/29/2016   Prostate cancer ([place]) 06/19/2016   Non-pressure chronic ulcer of other part of right foot limited to breakdown of skin ([place]) 04/04/2016   Cellulitis of leg, right 12/24/2015   Cellulitis of right lower extremity 12/24/2015   Gout 09/14/2012   HTN (hypertension) 09/14/2012   Hypothyroidism 09/14/2012   CAD (coronary artery disease) 09/14/2012   Diabetes mellitus ([place]) 09/14/2012   Hypercholesteremia    Neuropathy (HCC)    Scot Jun, PT, DPT, OCS, ATC 01/12/21  10:12 AM  [facility] Physical Therapy [address] [place], [place], [address] Phone: [phone]   Fax:  [phone]  Name: [name] MRN: [id number] Date of Birth: 06/07/1950

## 2021-01-15 ENCOUNTER — Ambulatory Visit: Payer: Medicare Other | Admitting: Orthopedic Surgery

## 2021-01-17 ENCOUNTER — Other Ambulatory Visit: Payer: Self-pay

## 2021-01-17 ENCOUNTER — Encounter: Payer: Self-pay | Admitting: Rehabilitative and Restorative Service Providers"

## 2021-01-17 ENCOUNTER — Ambulatory Visit (INDEPENDENT_AMBULATORY_CARE_PROVIDER_SITE_OTHER): Payer: Medicare Other | Admitting: Rehabilitative and Restorative Service Providers"

## 2021-01-17 DIAGNOSIS — M79672 Pain in left foot: Secondary | ICD-10-CM | POA: Diagnosis not present

## 2021-01-17 DIAGNOSIS — R262 Difficulty in walking, not elsewhere classified: Secondary | ICD-10-CM

## 2021-01-17 DIAGNOSIS — M6281 Muscle weakness (generalized): Secondary | ICD-10-CM

## 2021-01-17 DIAGNOSIS — M79671 Pain in right foot: Secondary | ICD-10-CM | POA: Diagnosis not present

## 2021-01-17 DIAGNOSIS — R293 Abnormal posture: Secondary | ICD-10-CM

## 2021-01-17 NOTE — Therapy (Signed)
[facility] Physical Therapy [address] [place], [place], [address] Phone: [phone]   Fax:  [phone]  Physical Therapy Treatment  Patient Details  Name: [name] MRN: [id number] Date of Birth: 1951/01/10 Referring Provider (PT): Dondra Prader NP   Encounter Date: 01/17/2021   PT End of Session - 01/17/21 1554     Visit Number 20    Number of Visits 30    Date for PT Re-Evaluation 03/09/21    Authorization Type Medicare, BCBS - KX required    Progress Note Due on Visit 34    PT Start Time 1550    PT Stop Time 1630    PT Time Calculation (min) 40 min    Equipment Utilized During Treatment Gait belt    Activity Tolerance Patient tolerated treatment well    Behavior During Therapy [facility] for tasks assessed/performed             Past Medical History:  Diagnosis Date   Ambulates with cane    straight cane   Cervical myelopathy ([place]) 02/06/2018   Chronic kidney disease    dailysis M W F- home   Complication of anesthesia    Coronary artery disease    Diabetes mellitus without complication ([place])    type2   Diabetic foot ulcer ([place])    Diabetic neuropathy ([place]) 02/06/2018   Gait abnormality 08/24/2018   GERD (gastroesophageal reflux disease)     01/06/20- not current   Gout    History of kidney stones    passed stones   Hypercholesteremia    Hypertension    Hypothyroidism    Neuromuscular disorder (HCC)    neuropathy left leg and bilateral feet   Neuropathy    PONV (postoperative nausea and vomiting)    Prostate cancer ([place])    PVD (peripheral vascular disease) ([place])    with amputations    Past Surgical History:  Procedure Laterality Date   A-FLUTTER ABLATION N/A 04/06/2020   Procedure: A-FLUTTER ABLATION;  Surgeon: [name], MD;  Location: [facility];  Service: Cardiovascular;  Laterality: N/A;   AMPUTATION Left 12/25/2013   Procedure: AMPUTATION RAY LEFT 5TH RAY;  Surgeon: [name], MD;  [facility];  Service:  Orthopedics;  Laterality: Left;   AMPUTATION Right 12/15/2018   Procedure: AMPUTATION OF 4TH AND 5TH TOES RIGHT FOOT;  Surgeon: [name], MD;  Location: [facility];  Service: Orthopedics;  Laterality: Right;   APPLICATION OF WOUND VAC Right 12/15/2018   Procedure: APPLICATION OF WOUND VAC;  Surgeon: [name], MD;  Location: [facility];  Service: Orthopedics;  Laterality: Right;   AV FISTULA PLACEMENT Left 03/25/2019   Procedure: LEFT ARM ARTERIOVENOUS (AV) FISTULA CREATION;  Surgeon: [name], MD;  Location: [facility];  Service: Vascular;  Laterality: Left;   [place] Left 05/20/2019   Procedure: SECOND STAGE LEFT BASCILIC VEIN TRANSPOSITION;  Surgeon: [name], MD;  Location: [facility];  Service: Vascular;  Laterality: Left;   CARDIAC CATHETERIZATION  02/17/2014   CHOLECYSTECTOMY     COLONOSCOPY  2011   in [place], [place]  2008   CYSTOSCOPY N/A 06/29/2020   Procedure: [place];  Surgeon: [name], MD;  Location: [facility];  Service: Urology;  Laterality: N/A;   CYSTOSCOPY WITH FULGERATION Bilateral 06/17/2020   Procedure: CYSTOSCOPY,BILATERAL RETROGRADE, CLOT EVACUATION WITH FULGERATION OF THE BLADDER;  Surgeon: Janith Lima, MD;  Location: [facility];  Service: Urology;  Laterality: Bilateral;   CYSTOSCOPY WITH FULGERATION N/A 06/28/2020   Procedure: CYSTOSCOPY WITH CLOT EVACUATION AND FULGERATION OF BLEEDERS;  Surgeon: [name], MD;  Location: [facility];  Service: Urology;  Laterality: N/A;  1 HR   I & D EXTREMITY Right 12/15/2018   Procedure: DEBRIDEMENT RIGHT FOOT;  Surgeon: [name], MD;  Location: [facility];  Service: Orthopedics;  Laterality: Right;   I & D EXTREMITY Right 08/27/2019   Procedure: PARTIAL CUBOID EXCISION RIGHT FOOT;  Surgeon: [name], MD;  Location: [facility];  Service: Orthopedics;  Laterality: Right;   I & D EXTREMITY Right 01/07/2020    Procedure: RIGHT FOOT EXCISION INFECTED BONE;  Surgeon: [name], MD;  Location: [place];  Service: Orthopedics;  Laterality: Right;   NECK SURGERY     novemver 2019   TRANSURETHRAL RESECTION OF PROSTATE N/A 06/28/2020   Procedure: TRANSURETHRAL RESECTION OF THE PROSTATE (TURP);  Surgeon: [name], MD;  Location: [place];  Service: Urology;  Laterality: N/A;   WISDOM TOOTH EXTRACTION      There were no vitals filed for this visit.   Subjective Assessment - 01/17/21 1622     Subjective Pt. indicated his feet have been bleeding, seeing Dr. [name] tomorrow.    Patient is accompained by: Family member    Pertinent History Chronic kidney disease, CAD, diabetic neuropathy, GERD, Gout, HTN, hypothyroidism, history of prostate cancer, PVD    Dialysis, double upright brace on Rt and AFO on Lt    Limitations Standing;Walking;House hold activities    Patient Stated Goals Get to using cane    Currently in Pain? Yes    Pain Score 5     Pain Location Foot    Pain Orientation Right    Pain Descriptors / Indicators Aching;Burning;Sore    Pain Onset More than a month ago    Pain Frequency Constant    Aggravating Factors  constant    Pain Relieving Factors nothing                               [facility] Adult PT Treatment/Exercise - 01/17/21 0001       Therapeutic Activites    Other Therapeutic Activities step up and over/down 6 inch c Lt hand assist/CGA x 10 each leg,  Ambulation c CGA to occasional min A c Quad cane in Lt arm c cone obstacle course 15 ft x 6 of household and obstacle navigation improvements.      Neuro Re-ed    Neuro Re-ed Details  modified tandem with front foot on 6 inch step 90 sec bilateral, feet together stance on foam EO 1 min, EC 1 min c min A off/on during.  BIG inspired lateral stepping x 10 bilateral      Ankle Exercises: Aerobic   Nustep Lvl 6 10 mins UE/LE                       PT Short Term Goals - 11/24/20 1131        PT SHORT TERM GOAL #1   Title Patient will demonstrate independent use of home exercise program to maintain progress from in clinic treatments.    Time 3    Period Weeks    Status Achieved    Target Date 11/24/20  PT Long Term Goals - 01/12/21 1008       PT LONG TERM GOAL #1   Title Patient will demonstrate/report pain at worst less than or equal to 2/10 to facilitate minimal limitation in daily activity secondary to pain symptoms.    Time 8    Period Weeks    Status Revised    Target Date 03/09/21      PT LONG TERM GOAL #2   Title Patient will demonstrate independent use of home exercise program to facilitate ability to maintain/progress functional gains from skilled physical therapy services.    Time 8    Period Weeks    Status Revised    Target Date 03/09/21      PT LONG TERM GOAL #3   Title Pt. will demonstrate ambulation c quad cane c mod independence community distances > 300 ft at gait speed > 2.6 ft for community ambulation.    Time 8    Period Weeks    Status Revised    Target Date 03/09/21      PT LONG TERM GOAL #4   Title Pt. will demonstrate bilateral hip flexion, knee flexion strength 5/5 to facilitate progressive mobility.    Time 8    Period Weeks    Status Revised    Target Date 03/09/21      PT LONG TERM GOAL #5   Title Pt. will demonstrate FOTO outcome > or = 63 %    Time 8    Period Weeks    Status Revised    Target Date 03/09/21      PT LONG TERM GOAL #6   Title Pt. will demonstrate BERG > 45 to indicate reduced fall risk due to condition.    Time 8    Period Weeks    Status Revised    Target Date 03/09/21                   Plan - 01/17/21 1627     Clinical Impression Statement Pt. demonstrated improved quality and less loss of balance instances c cone course in ambulation. Pt. to continue to benefit from improvements in stability and control to facilitate reduced fall risk.  Medical necessity for continued  gains/maintenance.    Personal Factors and Comorbidities Comorbidity 3+    Comorbidities Chronic kidney disease, CAD, diabetic neuropathy, GERD, Gout, HTN, hypothyroidism, history of prostate cancer, PVD    Dialysis, double upright brace on Rt and AFO on Lt    Stability/Clinical Decision Making Evolving/Moderate complexity    Rehab Potential --   fair to good   PT Frequency 2x / week    PT Duration 8 weeks    PT Treatment/Interventions ADLs/Self Care Home Management;Cryotherapy;Moist Heat;Balance training;Therapeutic exercise;Therapeutic activities;Functional mobility training;Stair training;Gait training;DME Instruction;Neuromuscular re-education;Patient/family education;Passive range of motion;Joint Manipulations;Splinting;Taping;Manual techniques;Orthotic Fit/Training    PT Next Visit Plan HIp and stepping strategy improvements, general strengthening  - USE KX    PT Home Exercise Plan [id number]    Consulted and Agree with Plan of Care Patient             Patient will benefit from skilled therapeutic intervention in order to improve the following deficits and impairments:  Abnormal gait, Decreased endurance, Hypomobility, Decreased strength, Decreased activity tolerance, Decreased balance, Decreased mobility, Difficulty walking, Impaired perceived functional ability, Improper body mechanics, Postural dysfunction, Impaired flexibility, Decreased coordination, Decreased range of motion  Visit Diagnosis: Pain in left foot  Pain in right foot  Muscle weakness (generalized)  Difficulty in walking, not elsewhere classified  Abnormal posture     Problem List Patient Active Problem List   Diagnosis Date Noted   Symptomatic anemia 09/13/2020   COVID-19 virus infection 09/13/2020   Pressure injury of skin 06/29/2020   Gross hematuria 06/16/2020   Typical atrial flutter ([place]) 03/24/2020   Bilateral pleural effusion 02/24/2020   Healthcare maintenance 01/28/2020   Shortness of breath  01/28/2020   History of partial ray amputation of fourth toe of right foot ([place]) 09/08/2019   Ulcerated, foot, right, with necrosis of bone ([place])    Chronic cough 06/25/2019   Cutaneous abscess of right foot    Subacute osteomyelitis of right foot ([place]) 12/14/2018   AKI (acute kidney injury) ([place]) 12/14/2018   ESRD (end stage renal disease) ([place]) 12/14/2018   Gait abnormality 08/24/2018   Diabetic neuropathy ([place]) 02/06/2018   Cervical myelopathy ([place]) 02/06/2018   Onychomycosis 10/30/2017   Other spondylosis with radiculopathy, cervical region 01/27/2017   Midfoot ulcer, right, limited to breakdown of skin ([place]) 11/15/2016   Lateral epicondylitis, left elbow 08/12/2016   Cellulitis of fifth toe of right foot 07/29/2016   Prostate cancer ([place]) 06/19/2016   Non-pressure chronic ulcer of other part of right foot limited to breakdown of skin ([place] [employer] [place]) 04/04/2016   Cellulitis of leg, right 12/24/2015   Cellulitis of right lower extremity 12/24/2015   Gout 09/14/2012   HTN (hypertension) 09/14/2012   Hypothyroidism 09/14/2012   CAD (coronary artery disease) 09/14/2012   Diabetes mellitus ([place]) 09/14/2012   Hypercholesteremia    Neuropathy ([place])     Scot Jun, PT, DPT, OCS, ATC 01/17/21  4:30 PM   [facility] [address] [place], [place], [address] Phone: [phone]   Fax:  [phone]  Name: [name] MRN: [id number] Date of Birth: 29-Jan-1951

## 2021-01-18 ENCOUNTER — Ambulatory Visit (INDEPENDENT_AMBULATORY_CARE_PROVIDER_SITE_OTHER): Payer: Medicare Other | Admitting: Orthopedic Surgery

## 2021-01-18 ENCOUNTER — Encounter: Payer: Self-pay | Admitting: Orthopedic Surgery

## 2021-01-18 DIAGNOSIS — L97421 Non-pressure chronic ulcer of left heel and midfoot limited to breakdown of skin: Secondary | ICD-10-CM | POA: Diagnosis not present

## 2021-01-18 DIAGNOSIS — I2510 Atherosclerotic heart disease of native coronary artery without angina pectoris: Secondary | ICD-10-CM

## 2021-01-18 DIAGNOSIS — L97411 Non-pressure chronic ulcer of right heel and midfoot limited to breakdown of skin: Secondary | ICD-10-CM

## 2021-01-18 NOTE — Progress Notes (Signed)
Office Visit Note   Patient: [name]           Date of Birth: [birth year]           MRN: [id number] Visit Date: 01/18/2021              Requested by: Lorenda Hatchet, FNP [id number] [place],  [place] [address] PCP: Lorenda Hatchet, FNP  Chief Complaint  Patient presents with   Right Foot - Wound Check   Left Foot - Wound Check      HPI: Patient is a 70 year old gentleman who presents with 2 ulcers on the plantar aspect of his left foot laterally and 1 ulcer on the plantar aspect of his right foot laterally.  Patient has bilateral double upright braces custom orthotics.  Assessment & Plan: Visit Diagnoses:  1. Midfoot ulcer, right, limited to breakdown of skin ([place])   2. Midfoot ulcer, left, limited to breakdown of skin ([place])     Plan: Ulcers were debrided x3 felt relieving pads were placed underneath his custom orthotics.  Nails were trimmed x3.  Follow-Up Instructions: Return in about 4 weeks (around 02/15/2021).   Ortho Exam  Patient is alert, oriented, no adenopathy, well-dressed, normal affect, normal respiratory effort. Examination patient has thickened discolored onychomycotic nails these were trimmed without complications.  Examination the right foot he has a progressive cavovarus deformity with ulceration over the mid lateral aspect of his foot.  After informed consent a 10 blade knife was used to debride the skin and soft tissue back to healthy viable granulation tissue this was touched with silver nitrate and a Band-Aid was applied.  The ulcer is 2 cm in diameter 1 mm deep with no exposed bone or tendon.  Semination of the left foot patient has an ulcer beneath the fifth metatarsal head as well as the base of the fifth metatarsal.  After informed consent a 10 blade knife was used to debride the skin and soft tissue back to healthy viable granulation tissue.  Both ulcers were 10 mm in diameter 1 mm deep.  Imaging: No results found. No images are  attached to the encounter.  Labs: Lab Results  Component Value Date   HGBA1C 5.4 09/13/2020   HGBA1C 5.7 (H) 06/16/2020   HGBA1C 6.3 (H) 12/14/2018   ESRSEDRATE 34 (H) 01/24/2020   ESRSEDRATE 41 (H) 01/10/2020   ESRSEDRATE 55 (H) 12/27/2019   CRP 8.8 (H) 01/24/2020   CRP 35.9 (H) 01/10/2020   CRP 77.9 (H) 12/27/2019   REPTSTATUS 09/29/2020 FINAL 09/28/2020   GRAMSTAIN  01/07/2020    FEW WBC PRESENT, PREDOMINANTLY PMN NO ORGANISMS SEEN    CULT (A) 09/28/2020    <10,000 COLONIES/mL INSIGNIFICANT GROWTH Performed at [facility] [address] [place], [place] [address]    LABORGA STAPHYLOCOCCUS HAEMOLYTICUS 01/07/2020   LABORGA STENOTROPHOMONAS MALTOPHILIA 01/07/2020     Lab Results  Component Value Date   ALBUMIN 3.2 (L) 09/14/2020   ALBUMIN 3.9 09/13/2020   ALBUMIN 2.9 (L) 06/21/2020   PREALBUMIN 14.8 (L) 12/14/2018    Lab Results  Component Value Date   MG 2.0 06/28/2020   MG 1.8 06/27/2020   MG 1.9 06/24/2020   No results found for: [facility]  Lab Results  Component Value Date   PREALBUMIN 14.8 (L) 12/14/2018   CBC EXTENDED Latest Ref Rng & Units 09/28/2020 09/14/2020 09/13/2020  WBC 4.0 - 10.5 K/uL 11.0(H) 5.2 -  RBC 4.22 - 5.81 MIL/uL 3.73(L)  2.75(L) -  HGB 13.0 - 17.0 g/dL 10.3(L) 7.5(L) 7.1(L)  HCT 39.0 - 52.0 % 33.1(L) 24.1(L) 22.5(L)  PLT 150 - 400 K/uL 190 120(L) -  NEUTROABS 1.7 - 7.7 K/uL - - -  LYMPHSABS 0.7 - 4.0 K/uL - - -     There is no height or weight on file to calculate BMI.  Orders:  No orders of the defined types were placed in this encounter.  No orders of the defined types were placed in this encounter.    Procedures: No procedures performed  Clinical Data: No additional findings.  ROS:  All other systems negative, except as noted in the HPI. Review of Systems  Objective: Vital Signs: There were no vitals taken for this visit.  Specialty Comments:  No specialty comments available.  PMFS History: Patient  Active Problem List   Diagnosis Date Noted   Symptomatic anemia 09/13/2020   COVID-19 virus infection 09/13/2020   Pressure injury of skin 06/29/2020   Gross hematuria 06/16/2020   Typical atrial flutter ([place]) 03/24/2020   Bilateral pleural effusion 02/24/2020   Healthcare maintenance 01/28/2020   Shortness of breath 01/28/2020   History of partial ray amputation of fourth toe of right foot ([place]) 09/08/2019   Ulcerated, foot, right, with necrosis of bone (HCC)    Chronic cough 06/25/2019   Cutaneous abscess of right foot    Subacute osteomyelitis of right foot ([place]) 12/14/2018   AKI (acute kidney injury) ([place]) 12/14/2018   ESRD (end stage renal disease) ([place]) 12/14/2018   Gait abnormality 08/24/2018   Diabetic neuropathy ([place]) 02/06/2018   Cervical myelopathy ([place]) 02/06/2018   Onychomycosis 10/30/2017   Other spondylosis with radiculopathy, cervical region 01/27/2017   Midfoot ulcer, right, limited to breakdown of skin ([place]) 11/15/2016   Lateral epicondylitis, left elbow 08/12/2016   Cellulitis of fifth toe of right foot 07/29/2016   Prostate cancer ([place]) 06/19/2016   Non-pressure chronic ulcer of other part of right foot limited to breakdown of skin ([place]) 04/04/2016   Cellulitis of leg, right 12/24/2015   Cellulitis of right lower extremity 12/24/2015   Gout 09/14/2012   HTN (hypertension) 09/14/2012   Hypothyroidism 09/14/2012   CAD (coronary artery disease) 09/14/2012   Diabetes mellitus ([place]) 09/14/2012   Hypercholesteremia    Neuropathy ([place])    Past Medical History:  Diagnosis Date   Ambulates with cane    straight cane   Cervical myelopathy ([place]) 02/06/2018   Chronic kidney disease    dailysis M W F- home   Complication of anesthesia    Coronary artery disease    Diabetes mellitus without complication ([place])    type2   Diabetic foot ulcer ([place])    Diabetic neuropathy ([place]) 02/06/2018   Gait abnormality 08/24/2018   GERD (gastroesophageal reflux disease)      01/06/20- not current   Gout    History of kidney stones    passed stones   Hypercholesteremia    Hypertension    Hypothyroidism    Neuromuscular disorder ([place])    neuropathy left leg and bilateral feet   Neuropathy    PONV (postoperative nausea and vomiting)    Prostate cancer ([place])    PVD (peripheral vascular disease) ([place])    with amputations    Family History  Problem Relation Age of Onset   Diabetes Mellitus II Mother    Kidney disease Mother    Diabetes Mellitus II Father    CAD Father    Cancer Father  prostate   Kidney disease Father    Diabetes Mellitus II Brother    Kidney disease Brother    Diabetes Mellitus II Brother    Stomach cancer Brother 13   Kidney disease Brother    Colon cancer Neg Hx    Colon polyps Neg Hx    Esophageal cancer Neg Hx    Rectal cancer Neg Hx    Pancreatic cancer Neg Hx     Past Surgical History:  Procedure Laterality Date   A-FLUTTER ABLATION N/A 04/06/2020   Procedure: A-FLUTTER ABLATION;  Surgeon: [name], MD;  Location: [facility];  Service: Cardiovascular;  Laterality: N/A;   AMPUTATION Left 12/25/2013   Procedure: AMPUTATION RAY LEFT 5TH RAY;  Surgeon: [name], MD;  [facility];  Service: Orthopedics;  Laterality: Left;   AMPUTATION Right 12/15/2018   Procedure: AMPUTATION OF 4TH AND 5TH TOES RIGHT FOOT;  Surgeon: [name], MD;  Location: [facility];  Service: Orthopedics;  Laterality: Right;   APPLICATION OF WOUND VAC Right 12/15/2018   Procedure: APPLICATION OF WOUND VAC;  Surgeon: [name], MD;  Location: [facility];  Service: Orthopedics;  Laterality: Right;   AV FISTULA PLACEMENT Left 03/25/2019   Procedure: LEFT ARM ARTERIOVENOUS (AV) FISTULA CREATION;  Surgeon: [name], MD;  Location: [facility];  Service: Vascular;  Laterality: Left;   [place] Left 05/20/2019   Procedure: SECOND STAGE LEFT BASCILIC VEIN TRANSPOSITION;  Surgeon: [name], MD;   Location: [facility];  Service: Vascular;  Laterality: Left;   CARDIAC CATHETERIZATION  02/17/2014   CHOLECYSTECTOMY     COLONOSCOPY  2011   in [place]  2008   CYSTOSCOPY N/A 06/29/2020   Procedure: [place];  Surgeon: [name], MD;  Location: [facility];  Service: Urology;  Laterality: N/A;   CYSTOSCOPY WITH FULGERATION Bilateral 06/17/2020   Procedure: CYSTOSCOPY,BILATERAL RETROGRADE, CLOT EVACUATION WITH FULGERATION OF THE BLADDER;  Surgeon: Janith Lima, MD;  Location: [facility];  Service: Urology;  Laterality: Bilateral;   CYSTOSCOPY WITH FULGERATION N/A 06/28/2020   Procedure: CYSTOSCOPY WITH CLOT EVACUATION AND FULGERATION OF BLEEDERS;  Surgeon: [name], MD;  Location: [facility];  Service: Urology;  Laterality: N/A;  1 HR   I & D EXTREMITY Right 12/15/2018   Procedure: DEBRIDEMENT RIGHT FOOT;  Surgeon: [name], MD;  Location: [facility];  Service: Orthopedics;  Laterality: Right;   I & D EXTREMITY Right 08/27/2019   Procedure: PARTIAL CUBOID EXCISION RIGHT FOOT;  Surgeon: [name], MD;  Location: [facility];  Service: Orthopedics;  Laterality: Right;   I & D EXTREMITY Right 01/07/2020   Procedure: RIGHT FOOT EXCISION INFECTED BONE;  Surgeon: [name], MD;  Location: [facility];  Service: Orthopedics;  Laterality: Right;   NECK SURGERY     novemver 2019   TRANSURETHRAL RESECTION OF PROSTATE N/A 06/28/2020   Procedure: TRANSURETHRAL RESECTION OF THE PROSTATE (TURP);  Surgeon: [name], MD;  Location: [facility];  Service: Urology;  Laterality: N/A;   WISDOM TOOTH EXTRACTION     Social History   Occupational History   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

## 2021-01-19 ENCOUNTER — Encounter: Payer: Self-pay | Admitting: Rehabilitative and Restorative Service Providers"

## 2021-01-19 ENCOUNTER — Other Ambulatory Visit: Payer: Self-pay

## 2021-01-19 ENCOUNTER — Ambulatory Visit (INDEPENDENT_AMBULATORY_CARE_PROVIDER_SITE_OTHER): Payer: Medicare Other | Admitting: Rehabilitative and Restorative Service Providers"

## 2021-01-19 DIAGNOSIS — M6281 Muscle weakness (generalized): Secondary | ICD-10-CM | POA: Diagnosis not present

## 2021-01-19 DIAGNOSIS — M79672 Pain in left foot: Secondary | ICD-10-CM | POA: Diagnosis not present

## 2021-01-19 DIAGNOSIS — R293 Abnormal posture: Secondary | ICD-10-CM

## 2021-01-19 DIAGNOSIS — M79671 Pain in right foot: Secondary | ICD-10-CM

## 2021-01-19 DIAGNOSIS — R262 Difficulty in walking, not elsewhere classified: Secondary | ICD-10-CM | POA: Diagnosis not present

## 2021-01-19 NOTE — Therapy (Signed)
[facility] [address] [place], [place], [address] Phone: [phone]   Fax:  [phone]  Physical Therapy Treatment  Patient Details  Name: [name] MRN: [id number] Date of Birth: 1951/01/15 Referring Provider (PT): Dondra Prader NP   Encounter Date: 01/19/2021   PT End of Session - 01/19/21 0928     Visit Number 21    Number of Visits 30    Date for PT Re-Evaluation 03/09/21    Authorization Type Medicare, BCBS - KX required    Progress Note Due on Visit 29    PT Start Time 0917    PT Stop Time 0957    PT Time Calculation (min) 40 min    Equipment Utilized During Treatment --    Activity Tolerance Patient tolerated treatment well    Behavior During Therapy [facility] for tasks assessed/performed             Past Medical History:  Diagnosis Date   Ambulates with cane    straight cane   Cervical myelopathy ([place]) 02/06/2018   Chronic kidney disease    dailysis M W F- home   Complication of anesthesia    Coronary artery disease    Diabetes mellitus without complication ([place])    type2   Diabetic foot ulcer ([place])    Diabetic neuropathy ([place]) 02/06/2018   Gait abnormality 08/24/2018   GERD (gastroesophageal reflux disease)     01/06/20- not current   Gout    History of kidney stones    passed stones   Hypercholesteremia    Hypertension    Hypothyroidism    Neuromuscular disorder (HCC)    neuropathy left leg and bilateral feet   Neuropathy    PONV (postoperative nausea and vomiting)    Prostate cancer ([place])    PVD (peripheral vascular disease) ([place])    with amputations    Past Surgical History:  Procedure Laterality Date   A-FLUTTER ABLATION N/A 04/06/2020   Procedure: A-FLUTTER ABLATION;  Surgeon: [name], MD;  Location: [facility];  Service: Cardiovascular;  Laterality: N/A;   AMPUTATION Left 12/25/2013   Procedure: AMPUTATION RAY LEFT 5TH RAY;  Surgeon: [name], MD;  [facility];  Service:  Orthopedics;  Laterality: Left;   AMPUTATION Right 12/15/2018   Procedure: AMPUTATION OF 4TH AND 5TH TOES RIGHT FOOT;  Surgeon: [name], MD;  Location: [facility];  Service: Orthopedics;  Laterality: Right;   APPLICATION OF WOUND VAC Right 12/15/2018   Procedure: APPLICATION OF WOUND VAC;  Surgeon: [name], MD;  Location: [facility];  Service: Orthopedics;  Laterality: Right;   AV FISTULA PLACEMENT Left 03/25/2019   Procedure: LEFT ARM ARTERIOVENOUS (AV) FISTULA CREATION;  Surgeon: [name], MD;  Location: [facility];  Service: Vascular;  Laterality: Left;   [place] Left 05/20/2019   Procedure: SECOND STAGE LEFT BASCILIC VEIN TRANSPOSITION;  Surgeon: [name], MD;  Location: [facility];  Service: Vascular;  Laterality: Left;   CARDIAC CATHETERIZATION  02/17/2014   CHOLECYSTECTOMY     COLONOSCOPY  2011   in [place], [place]  2008   CYSTOSCOPY N/A 06/29/2020   Procedure: [place];  Surgeon: [name], MD;  Location: [facility];  Service: Urology;  Laterality: N/A;   CYSTOSCOPY WITH FULGERATION Bilateral 06/17/2020   Procedure: CYSTOSCOPY,BILATERAL RETROGRADE, CLOT EVACUATION WITH FULGERATION OF THE BLADDER;  Surgeon: Janith Lima, MD;  Location: [facility];  Service: Urology;  Laterality: Bilateral;   CYSTOSCOPY WITH FULGERATION N/A 06/28/2020   Procedure: CYSTOSCOPY WITH CLOT EVACUATION AND FULGERATION OF BLEEDERS;  Surgeon: [name], MD;  Location: [facility];  Service: Urology;  Laterality: N/A;  1 HR   I & D EXTREMITY Right 12/15/2018   Procedure: DEBRIDEMENT RIGHT FOOT;  Surgeon: [name], MD;  Location: [facility];  Service: Orthopedics;  Laterality: Right;   I & D EXTREMITY Right 08/27/2019   Procedure: PARTIAL CUBOID EXCISION RIGHT FOOT;  Surgeon: [name], MD;  Location: [facility];  Service: Orthopedics;  Laterality: Right;   I & D EXTREMITY Right 01/07/2020    Procedure: RIGHT FOOT EXCISION INFECTED BONE;  Surgeon: [name], MD;  Location: [facility];  Service: Orthopedics;  Laterality: Right;   NECK SURGERY     novemver 2019   TRANSURETHRAL RESECTION OF PROSTATE N/A 06/28/2020   Procedure: TRANSURETHRAL RESECTION OF THE PROSTATE (TURP);  Surgeon: [name], MD;  Location: [facility];  Service: Urology;  Laterality: N/A;   WISDOM TOOTH EXTRACTION      There were no vitals filed for this visit.   Subjective Assessment - 01/19/21 0923     Subjective Pt. indicated getting "feet carved up " at MD visit yesterday so feeling some sore increase.    Patient is accompained by: Family member    Pertinent History Chronic kidney disease, CAD, diabetic neuropathy, GERD, Gout, HTN, hypothyroidism, history of prostate cancer, PVD    Dialysis, double upright brace on Rt and AFO on Lt    Limitations Standing;Walking;House hold activities    Patient Stated Goals Get to using cane    Currently in Pain? Yes    Pain Score 6     Pain Location Foot    Pain Orientation Right;Left    Pain Descriptors / Indicators Aching;Burning;Sore    Pain Type Chronic pain    Pain Onset More than a month ago    Pain Frequency Constant    Aggravating Factors  sore after MD visit    Pain Relieving Factors nothing                [facility] PT Assessment - 01/19/21 0001       Assessment   Medical Diagnosis Z89.421 (ICD-10-CM) - History of partial ray amputation of fourth toe of right foot ([place])    Referring Provider (PT) Dondra Prader NP      Ambulation/Gait   Ambulation/Gait Assistance 6: Modified independent (Device/Increase time)    Assistive device Other (Comment)   quad cane in Lt UE                          [facility] Adult PT Treatment/Exercise - 01/19/21 0001       Ankle Exercises: Aerobic   Nustep Lvl 6 15 mins UE/LE      Ankle Exercises: Machines for Strengthening   Cybex Leg Press 68 lbs 2 x 15 bilateral, single leg 37 lbs 2 x 15 bilateral       Ankle Exercises: Seated   Other Seated Ankle Exercises Machine eccentric lowering LAQ 15 lbs 2 x 10 bilateral, sit to stand x 5      Ankle Exercises: Standing   Other Standing Ankle Exercises hip abduction 20 x bilateral ( 2 hand assist), extension 20 x each (2 hand assist)  PT Short Term Goals - 11/24/20 1131       PT SHORT TERM GOAL #1   Title Patient will demonstrate independent use of home exercise program to maintain progress from in clinic treatments.    Time 3    Period Weeks    Status Achieved    Target Date 11/24/20               PT Long Term Goals - 01/12/21 1008       PT LONG TERM GOAL #1   Title Patient will demonstrate/report pain at worst less than or equal to 2/10 to facilitate minimal limitation in daily activity secondary to pain symptoms.    Time 8    Period Weeks    Status Revised    Target Date 03/09/21      PT LONG TERM GOAL #2   Title Patient will demonstrate independent use of home exercise program to facilitate ability to maintain/progress functional gains from skilled physical therapy services.    Time 8    Period Weeks    Status Revised    Target Date 03/09/21      PT LONG TERM GOAL #3   Title Pt. will demonstrate ambulation c quad cane c mod independence community distances > 300 ft at gait speed > 2.6 ft for community ambulation.    Time 8    Period Weeks    Status Revised    Target Date 03/09/21      PT LONG TERM GOAL #4   Title Pt. will demonstrate bilateral hip flexion, knee flexion strength 5/5 to facilitate progressive mobility.    Time 8    Period Weeks    Status Revised    Target Date 03/09/21      PT LONG TERM GOAL #5   Title Pt. will demonstrate FOTO outcome > or = 63 %    Time 8    Period Weeks    Status Revised    Target Date 03/09/21      PT LONG TERM GOAL #6   Title Pt. will demonstrate BERG > 45 to indicate reduced fall risk due to condition.    Time 8    Period Weeks     Status Revised    Target Date 03/09/21                   Plan - 01/19/21 7510     Clinical Impression Statement Adjusted intervention while in clinic today to allow easier pressure of recently treated feet.  Focus on strengthening and progressive endurance in sitting vs. standing balance.  Pt. demonstrated continued medical necessity for skilled PT services to continue to try to reduce fall risk and improve progressive mobility stability for household and community ambulation.  Without skilled PT services, Pt. is at risk for loss of gains and continued fall risk.    Personal Factors and Comorbidities Comorbidity 3+    Comorbidities Chronic kidney disease, CAD, diabetic neuropathy, GERD, Gout, HTN, hypothyroidism, history of prostate cancer, PVD    Dialysis, double upright brace on Rt and AFO on Lt    Stability/Clinical Decision Making Evolving/Moderate complexity    Rehab Potential --   fair to good   PT Frequency 2x / week    PT Duration 8 weeks    PT Treatment/Interventions ADLs/Self Care Home Management;Cryotherapy;Moist Heat;Balance training;Therapeutic exercise;Therapeutic activities;Functional mobility training;Stair training;Gait training;DME Instruction;Neuromuscular re-education;Patient/family education;Passive range of motion;Joint Manipulations;Splinting;Taping;Manual techniques;Orthotic Fit/Training    PT Next Visit Plan Resume standing  balance interventions as tolerated - USE KX    PT Home Exercise Plan [email or web address]    Consulted and Agree with Plan of Care Patient             Patient will benefit from skilled therapeutic intervention in order to improve the following deficits and impairments:  Abnormal gait, Decreased endurance, Hypomobility, Decreased strength, Decreased activity tolerance, Decreased balance, Decreased mobility, Difficulty walking, Impaired perceived functional ability, Improper body mechanics, Postural dysfunction, Impaired flexibility, Decreased  coordination, Decreased range of motion  Visit Diagnosis: Pain in left foot  Pain in right foot  Muscle weakness (generalized)  Difficulty in walking, not elsewhere classified  Abnormal posture     Problem List Patient Active Problem List   Diagnosis Date Noted   Symptomatic anemia 09/13/2020   COVID-19 virus infection 09/13/2020   Pressure injury of skin 06/29/2020   Gross hematuria 06/16/2020   Typical atrial flutter ([place]) 03/24/2020   Bilateral pleural effusion 02/24/2020   Healthcare maintenance 01/28/2020   Shortness of breath 01/28/2020   History of partial ray amputation of fourth toe of right foot ([place]) 09/08/2019   Ulcerated, foot, right, with necrosis of bone (HCC)    Chronic cough 06/25/2019   Cutaneous abscess of right foot    Subacute osteomyelitis of right foot ([place]) 12/14/2018   AKI (acute kidney injury) ([place]) 12/14/2018   ESRD (end stage renal disease) ([place]) 12/14/2018   Gait abnormality 08/24/2018   Diabetic neuropathy ([name]) 02/06/2018   Cervical myelopathy ([place]) 02/06/2018   Onychomycosis 10/30/2017   Other spondylosis with radiculopathy, cervical region 01/27/2017   Midfoot ulcer, right, limited to breakdown of skin ([place]) 11/15/2016   Lateral epicondylitis, left elbow 08/12/2016   Cellulitis of fifth toe of right foot 07/29/2016   Prostate cancer ([place]) 06/19/2016   Non-pressure chronic ulcer of other part of right foot limited to breakdown of skin ([place]) 04/04/2016   Cellulitis of leg, right 12/24/2015   Cellulitis of right lower extremity 12/24/2015   Gout 09/14/2012   HTN (hypertension) 09/14/2012   Hypothyroidism 09/14/2012   CAD (coronary artery disease) 09/14/2012   Diabetes mellitus ([place]) 09/14/2012   Hypercholesteremia    Neuropathy ([place])     Scot Jun, PT, DPT, OCS, ATC 01/19/21  9:55 AM    [facility] [address] [place], [place], [address] Phone: [phone]   Fax:   [phone]  Name: Malvern Kadlec MRN: [id number] Date of Birth: 01/17/1951

## 2021-01-24 ENCOUNTER — Ambulatory Visit (INDEPENDENT_AMBULATORY_CARE_PROVIDER_SITE_OTHER): Payer: Medicare Other | Admitting: Rehabilitative and Restorative Service Providers"

## 2021-01-24 ENCOUNTER — Encounter: Payer: Self-pay | Admitting: Rehabilitative and Restorative Service Providers"

## 2021-01-24 ENCOUNTER — Other Ambulatory Visit: Payer: Self-pay

## 2021-01-24 DIAGNOSIS — M79671 Pain in right foot: Secondary | ICD-10-CM

## 2021-01-24 DIAGNOSIS — M6281 Muscle weakness (generalized): Secondary | ICD-10-CM | POA: Diagnosis not present

## 2021-01-24 DIAGNOSIS — M79672 Pain in left foot: Secondary | ICD-10-CM

## 2021-01-24 DIAGNOSIS — R262 Difficulty in walking, not elsewhere classified: Secondary | ICD-10-CM

## 2021-01-24 DIAGNOSIS — R293 Abnormal posture: Secondary | ICD-10-CM

## 2021-01-24 NOTE — Therapy (Addendum)
[facility] Physical Therapy [address] [place], [place], [address] Phone: [phone]   Fax:  [phone]  Physical Therapy Treatment /Discharge  Patient Details  Name: [name] MRN: [id number] Date of Birth: June 14, 1950 Referring Provider (PT): Dondra Prader NP   Encounter Date: 01/24/2021   PT End of Session - 01/24/21 1603     Visit Number 22    Number of Visits 30    Date for PT Re-Evaluation 03/09/21    Authorization Type Medicare, BCBS - KX required    Progress Note Due on Visit 29    PT Start Time 1555    PT Stop Time 6712    PT Time Calculation (min) 39 min    Equipment Utilized During Treatment Gait belt    Activity Tolerance Patient limited by fatigue;Patient limited by pain    Behavior During Therapy [facility] for tasks assessed/performed             Past Medical History:  Diagnosis Date   Ambulates with cane    straight cane   Cervical myelopathy ([place]) 02/06/2018   Chronic kidney disease    dailysis M W F- home   Complication of anesthesia    Coronary artery disease    Diabetes mellitus without complication ([place])    type2   Diabetic foot ulcer ([name])    Diabetic neuropathy ([place]) 02/06/2018   Gait abnormality 08/24/2018   GERD (gastroesophageal reflux disease)     01/06/20- not current   Gout    History of kidney stones    passed stones   Hypercholesteremia    Hypertension    Hypothyroidism    Neuromuscular disorder ([place])    neuropathy left leg and bilateral feet   Neuropathy    PONV (postoperative nausea and vomiting)    Prostate cancer ([place])    PVD (peripheral vascular disease) ([place])    with amputations    Past Surgical History:  Procedure Laterality Date   A-FLUTTER ABLATION N/A 04/06/2020   Procedure: A-FLUTTER ABLATION;  Surgeon: [name], MD;  Location: [place] [facility];  Service: Cardiovascular;  Laterality: N/A;   AMPUTATION Left 12/25/2013   Procedure: AMPUTATION RAY LEFT 5TH RAY;  Surgeon: [name], MD;   [facility];  Service: Orthopedics;  Laterality: Left;   AMPUTATION Right 12/15/2018   Procedure: AMPUTATION OF 4TH AND 5TH TOES RIGHT FOOT;  Surgeon: [name], MD;  Location: [place];  Service: Orthopedics;  Laterality: Right;   APPLICATION OF WOUND VAC Right 12/15/2018   Procedure: APPLICATION OF WOUND VAC;  Surgeon: [name], MD;  Location: [place];  Service: Orthopedics;  Laterality: Right;   AV FISTULA PLACEMENT Left 03/25/2019   Procedure: LEFT ARM ARTERIOVENOUS (AV) FISTULA CREATION;  Surgeon: [name], MD;  Location: [place] [facility];  Service: Vascular;  Laterality: Left;   [place] Left 05/20/2019   Procedure: SECOND STAGE LEFT BASCILIC VEIN TRANSPOSITION;  Surgeon: [name], MD;  Location: [place] [facility];  Service: Vascular;  Laterality: Left;   CARDIAC CATHETERIZATION  02/17/2014   CHOLECYSTECTOMY     COLONOSCOPY  2011   in [place], [place]  2008   CYSTOSCOPY N/A 06/29/2020   Procedure: [place];  Surgeon: [name], MD;  Location: [name];  Service: Urology;  Laterality: N/A;   CYSTOSCOPY WITH FULGERATION Bilateral 06/17/2020   Procedure: CYSTOSCOPY,BILATERAL RETROGRADE, CLOT EVACUATION WITH  FULGERATION OF THE BLADDER;  Surgeon: Janith Lima, MD;  Location: [place];  Service: Urology;  Laterality: Bilateral;   CYSTOSCOPY WITH FULGERATION N/A 06/28/2020   Procedure: CYSTOSCOPY WITH CLOT EVACUATION AND FULGERATION OF BLEEDERS;  Surgeon: [name], MD;  Location: [place];  Service: Urology;  Laterality: N/A;  1 HR   I & D EXTREMITY Right 12/15/2018   Procedure: DEBRIDEMENT RIGHT FOOT;  Surgeon: [name], MD;  Location: [place];  Service: Orthopedics;  Laterality: Right;   I & D EXTREMITY Right 08/27/2019   Procedure: PARTIAL CUBOID EXCISION RIGHT FOOT;  Surgeon: [name], MD;  Location: [place];  Service: Orthopedics;  Laterality: Right;   I & D  EXTREMITY Right 01/07/2020   Procedure: RIGHT FOOT EXCISION INFECTED BONE;  Surgeon: [name], MD;  Location: [place];  Service: Orthopedics;  Laterality: Right;   NECK SURGERY     novemver 2019   TRANSURETHRAL RESECTION OF PROSTATE N/A 06/28/2020   Procedure: TRANSURETHRAL RESECTION OF THE PROSTATE (TURP);  Surgeon: [name], MD;  Location: [place];  Service: Urology;  Laterality: N/A;   WISDOM TOOTH EXTRACTION      There were no vitals filed for this visit.   Subjective Assessment - 01/24/21 1626     Subjective Pt. indicated feeling off today in general.  Feet still hurting since the MD visit for procedure.    Patient is accompained by: Family member    Pertinent History Chronic kidney disease, CAD, diabetic neuropathy, GERD, Gout, HTN, hypothyroidism, history of prostate cancer, PVD    Dialysis, double upright brace on Rt and AFO on Lt    Limitations Standing;Walking;House hold activities    Patient Stated Goals Get to using cane    Currently in Pain? Yes    Pain Score 6     Pain Location Foot    Pain Orientation Right;Left    Pain Descriptors / Indicators Aching;Burning;Sore    Pain Type Chronic pain    Pain Onset More than a month ago    Pain Frequency Constant    Aggravating Factors  continued constant    Pain Relieving Factors nothing specific, pressure off helps some                               [facility] Adult PT Treatment/Exercise - 01/24/21 0001       Neuro Re-ed    Neuro Re-ed Details  ramp and curb up/down 6 inch x 2 (mod A required to prevent fall), CGA ot min A cone navigation and step on/off airex foam pad x 4 (cessation of standing activty due to fatigue, back complaints, and difficulty)      Ankle Exercises: Aerobic   Nustep Lvl 6 12 mins UE/LE      Ankle Exercises: Seated   Other Seated Ankle Exercises LAQ 5 lbs 2 x 15 bilateral    Other Seated Ankle Exercises scapular retraction 2 sec hold x 10 (for improved posture in  ambulation/standing)                       PT Short Term Goals - 11/24/20 1131       PT SHORT TERM GOAL #1   Title Patient will demonstrate independent use of home exercise program to maintain progress from in clinic treatments.    Time 3    Period Weeks    Status Achieved  Target Date 11/24/20               PT Long Term Goals - 01/12/21 1008       PT LONG TERM GOAL #1   Title Patient will demonstrate/report pain at worst less than or equal to 2/10 to facilitate minimal limitation in daily activity secondary to pain symptoms.    Time 8    Period Weeks    Status Revised    Target Date 03/09/21      PT LONG TERM GOAL #2   Title Patient will demonstrate independent use of home exercise program to facilitate ability to maintain/progress functional gains from skilled physical therapy services.    Time 8    Period Weeks    Status Revised    Target Date 03/09/21      PT LONG TERM GOAL #3   Title Pt. will demonstrate ambulation c quad cane c mod independence community distances > 300 ft at gait speed > 2.6 ft for community ambulation.    Time 8    Period Weeks    Status Revised    Target Date 03/09/21      PT LONG TERM GOAL #4   Title Pt. will demonstrate bilateral hip flexion, knee flexion strength 5/5 to facilitate progressive mobility.    Time 8    Period Weeks    Status Revised    Target Date 03/09/21      PT LONG TERM GOAL #5   Title Pt. will demonstrate FOTO outcome > or = 63 %    Time 8    Period Weeks    Status Revised    Target Date 03/09/21      PT LONG TERM GOAL #6   Title Pt. will demonstrate BERG > 45 to indicate reduced fall risk due to condition.    Time 8    Period Weeks    Status Revised    Target Date 03/09/21                   Plan - 01/24/21 1634     Clinical Impression Statement Pt. demonstrated increased fatigue, general balance control decrease and overall flatter affect today vs. previous visits.  Pt.  indicated he just hasn't felt the best today and generally tired.  Adjusted treatment accordingly.  In addition to continued skilled PT services for balance and strength gains to reduce fall risk (medicallly necessary), inclusion of postural strengthening would help balance control.    Personal Factors and Comorbidities Comorbidity 3+    Comorbidities Chronic kidney disease, CAD, diabetic neuropathy, GERD, Gout, HTN, hypothyroidism, history of prostate cancer, PVD    Dialysis, double upright brace on Rt and AFO on Lt    Stability/Clinical Decision Making Evolving/Moderate complexity    Rehab Potential --   fair to good   PT Frequency 2x / week    PT Duration 8 weeks    PT Treatment/Interventions ADLs/Self Care Home Management;Cryotherapy;Moist Heat;Balance training;Therapeutic exercise;Therapeutic activities;Functional mobility training;Stair training;Gait training;DME Instruction;Neuromuscular re-education;Patient/family education;Passive range of motion;Joint Manipulations;Splinting;Taping;Manual techniques;Orthotic Fit/Training    PT Next Visit Plan Continue hip strategy and stepping response, postural strengthening.  - USE KX    PT Home Exercise Plan [email or web address]    Consulted and Agree with Plan of Care Patient             Patient will benefit from skilled therapeutic intervention in order to improve the following deficits and impairments:  Abnormal gait, Decreased endurance, Hypomobility, Decreased strength, Decreased  activity tolerance, Decreased balance, Decreased mobility, Difficulty walking, Impaired perceived functional ability, Improper body mechanics, Postural dysfunction, Impaired flexibility, Decreased coordination, Decreased range of motion  Visit Diagnosis: Pain in left foot  Pain in right foot  Muscle weakness (generalized)  Difficulty in walking, not elsewhere classified  Abnormal posture     Problem List Patient Active Problem List   Diagnosis Date Noted    Symptomatic anemia 09/13/2020   COVID-19 virus infection 09/13/2020   Pressure injury of skin 06/29/2020   Gross hematuria 06/16/2020   Typical atrial flutter ([place]) 03/24/2020   Bilateral pleural effusion 02/24/2020   Healthcare maintenance 01/28/2020   Shortness of breath 01/28/2020   History of partial ray amputation of fourth toe of right foot ([place]) 09/08/2019   Ulcerated, foot, right, with necrosis of bone ([place])    Chronic cough 06/25/2019   Cutaneous abscess of right foot    Subacute osteomyelitis of right foot ([place]) 12/14/2018   AKI (acute kidney injury) ([place]) 12/14/2018   ESRD (end stage renal disease) ([place]) 12/14/2018   Gait abnormality 08/24/2018   Diabetic neuropathy ([place]) 02/06/2018   Cervical myelopathy ([place]) 02/06/2018   Onychomycosis 10/30/2017   Other spondylosis with radiculopathy, cervical region 01/27/2017   Midfoot ulcer, right, limited to breakdown of skin ([place]) 11/15/2016   Lateral epicondylitis, left elbow 08/12/2016   Cellulitis of fifth toe of right foot 07/29/2016   Prostate cancer ([place]) 06/19/2016   Non-pressure chronic ulcer of other part of right foot limited to breakdown of skin ([place]) 04/04/2016   Cellulitis of leg, right 12/24/2015   Cellulitis of right lower extremity 12/24/2015   Gout 09/14/2012   HTN (hypertension) 09/14/2012   Hypothyroidism 09/14/2012   CAD (coronary artery disease) 09/14/2012   Diabetes mellitus ([place]) 09/14/2012   Hypercholesteremia    Neuropathy ([place])    Scot Jun, PT, DPT, OCS, ATC 01/24/21  4:37 PM   PHYSICAL THERAPY DISCHARGE SUMMARY  Visits from Start of Care: 22  Current functional level related to goals / functional outcomes: See note   Remaining deficits: See note   Education / Equipment: HEP   Patient agrees to discharge. Patient goals were partially met. Patient is being discharged due to a change in medical status.  Had toe amputated since last visit in clinic.   Scot Jun, PT, DPT, OCS,  ATC 02/12/21  7:54 AM      [facility] Physical Therapy [address] [place], [place], [address] Phone: [phone]   Fax:  [phone]  Name: [name] MRN: [id number] Date of Birth: 1951/02/09

## 2021-01-26 ENCOUNTER — Encounter: Payer: Medicare Other | Admitting: Rehabilitative and Restorative Service Providers"

## 2021-01-26 ENCOUNTER — Other Ambulatory Visit: Payer: Self-pay

## 2021-01-26 ENCOUNTER — Telehealth: Payer: Self-pay | Admitting: Orthopedic Surgery

## 2021-01-26 MED ORDER — DOXYCYCLINE HYCLATE 100 MG PO CAPS: 100.0000 mg | ORAL_CAPSULE | Freq: Two times a day (BID) | ORAL | 0 refills | Status: DC

## 2021-01-26 NOTE — Telephone Encounter (Signed)
See last encounter, rx has been sent to pharmacy. Pt informed.

## 2021-01-26 NOTE — Telephone Encounter (Signed)
Pt called stating he is seeing some puss come from his left foot and he would like to have an antibiotic called in. Pt would like a CB when this has been sent in please.   [phone]

## 2021-01-26 NOTE — Telephone Encounter (Signed)
Will you approve abx for pt his was in the office for bilateral foot callus trim and states there is drainage from one foot. I can move the appt up to Monday for follow up but ok to give abx to cover through weekend.

## 2021-01-26 NOTE — Telephone Encounter (Signed)
Pt informed that Rx sent to pharmacy.

## 2021-01-26 NOTE — Telephone Encounter (Signed)
Pt called and really needs that antibiotic called in. Please call him when sent in if possible please.   CB [phone]

## 2021-01-31 ENCOUNTER — Ambulatory Visit: Payer: Medicare Other | Admitting: Family

## 2021-01-31 ENCOUNTER — Encounter: Payer: Medicare Other | Admitting: Rehabilitative and Restorative Service Providers"

## 2021-01-31 ENCOUNTER — Telehealth: Payer: Self-pay | Admitting: Orthopedic Surgery

## 2021-01-31 NOTE — Telephone Encounter (Signed)
Pt calling again wanting to speak to the nurse if possible. Pt says he has an infection in his foot. He wanted to see Dr. [name] only but appt for today was made with Junie Panning, told pt [name] is not here today but he does have the next soonest appt with him on Monday the 31st if he wants to see him specifically if not he has the appt for this afternoon with [name]. The best call back number is [phone].

## 2021-01-31 NOTE — Telephone Encounter (Signed)
Pt called requesting a call back. Pt is asking to come in today. Pt states he has an infection in his foot. Pt phone number is [phone].

## 2021-01-31 NOTE — Telephone Encounter (Signed)
Can you please call pt and if he wants an appt tomorrow with Dr. [name] we can open up a spot

## 2021-01-31 NOTE — Telephone Encounter (Signed)
Can you please call pt and make an appt

## 2021-02-01 ENCOUNTER — Telehealth: Payer: Self-pay

## 2021-02-01 ENCOUNTER — Encounter: Payer: Self-pay | Admitting: Orthopedic Surgery

## 2021-02-01 ENCOUNTER — Encounter (HOSPITAL_COMMUNITY): Payer: Self-pay | Admitting: Orthopedic Surgery

## 2021-02-01 ENCOUNTER — Ambulatory Visit (INDEPENDENT_AMBULATORY_CARE_PROVIDER_SITE_OTHER): Payer: Medicare Other | Admitting: Orthopedic Surgery

## 2021-02-01 ENCOUNTER — Other Ambulatory Visit: Payer: Self-pay

## 2021-02-01 DIAGNOSIS — M869 Osteomyelitis, unspecified: Secondary | ICD-10-CM

## 2021-02-01 DIAGNOSIS — L02612 Cutaneous abscess of left foot: Secondary | ICD-10-CM | POA: Diagnosis not present

## 2021-02-01 DIAGNOSIS — I2510 Atherosclerotic heart disease of native coronary artery without angina pectoris: Secondary | ICD-10-CM

## 2021-02-01 DIAGNOSIS — L03032 Cellulitis of left toe: Secondary | ICD-10-CM | POA: Diagnosis not present

## 2021-02-01 NOTE — Progress Notes (Signed)
Office Visit Note   Patient: [name]           Date of Birth: 09-22-1950           MRN: [id number] Visit Date: 02/01/2021              Requested by: Lorenda Hatchet, [place] [address] [place],  [place] [address] PCP: Lorenda Hatchet, FNP  Chief Complaint  Patient presents with   Left Foot - Wound Check      HPI: Patient is a 70 year old gentleman who presents with acute cellulitis ulceration and drainage of left foot fourth metatarsal head patient complains of an odor from the drainage.  Patient states he is currently on doxycycline twice a day.  Patient has dialysis today.  Assessment & Plan: Visit Diagnoses:  1. Osteomyelitis of fourth toe of left foot ([place])   2. Cellulitis and abscess of toe of left foot     Plan: With the progressive cellulitis throughout the foot with purulent draining ulcer and osteomyelitis of the fourth metatarsal head discussed that our best foot salvage intervention option is a fourth ray amputation.  Risk and benefits were discussed including risk of the wound not healing.  Anticipate discharge to home after observation.  Discussed that if patient has difficulty with therapy that skilled nursing may be required.  Would plan for dialysis in the hospital on Saturday.  Follow-Up Instructions: Return in about 1 week (around 02/08/2021).   Ortho Exam  Patient is alert, oriented, no adenopathy, well-dressed, normal affect, normal respiratory effort. Examination patient has a good dorsalis pedis pulse.  He has cellulitis up to the hindfoot.  There is purulent drainage from an ulcer beneath the fourth metatarsal head this is probed to probes down to bone.  There is necrotic tissue in the wound bed.  Imaging: No results found. No images are attached to the encounter.  Labs: Lab Results  Component Value Date   HGBA1C 5.4 09/13/2020   HGBA1C 5.7 (H) 06/16/2020   HGBA1C 6.3 (H) 12/14/2018   ESRSEDRATE 34 (H) 01/24/2020   ESRSEDRATE  41 (H) 01/10/2020   ESRSEDRATE 55 (H) 12/27/2019   CRP 8.8 (H) 01/24/2020   CRP 35.9 (H) 01/10/2020   CRP 77.9 (H) 12/27/2019   REPTSTATUS 09/29/2020 FINAL 09/28/2020   GRAMSTAIN  01/07/2020    FEW WBC PRESENT, PREDOMINANTLY PMN NO ORGANISMS SEEN    CULT (A) 09/28/2020    <10,000 COLONIES/mL INSIGNIFICANT GROWTH Performed at [facility] [address] [place],  [address]    LABORGA STAPHYLOCOCCUS HAEMOLYTICUS 01/07/2020   LABORGA STENOTROPHOMONAS MALTOPHILIA 01/07/2020     Lab Results  Component Value Date   ALBUMIN 3.2 (L) 09/14/2020   ALBUMIN 3.9 09/13/2020   ALBUMIN 2.9 (L) 06/21/2020   PREALBUMIN 14.8 (L) 12/14/2018    Lab Results  Component Value Date   MG 2.0 06/28/2020   MG 1.8 06/27/2020   MG 1.9 06/24/2020   No results found for: [facility]  Lab Results  Component Value Date   PREALBUMIN 14.8 (L) 12/14/2018   CBC EXTENDED Latest Ref Rng & Units 09/28/2020 09/14/2020 09/13/2020  WBC 4.0 - 10.5 K/uL 11.0(H) 5.2 -  RBC 4.22 - 5.81 MIL/uL 3.73(L) 2.75(L) -  HGB 13.0 - 17.0 g/dL 10.3(L) 7.5(L) 7.1(L)  HCT 39.0 - 52.0 % 33.1(L) 24.1(L) 22.5(L)  PLT 150 - 400 K/uL 190 120(L) -  NEUTROABS 1.7 - 7.7 K/uL - - -  LYMPHSABS 0.7 - 4.0 K/uL - - -  There is no height or weight on file to calculate BMI.  Orders:  No orders of the defined types were placed in this encounter.  No orders of the defined types were placed in this encounter.    Procedures: No procedures performed  Clinical Data: No additional findings.  ROS:  All other systems negative, except as noted in the HPI. Review of Systems  Objective: Vital Signs: There were no vitals taken for this visit.  Specialty Comments:  No specialty comments available.  PMFS History: Patient Active Problem List   Diagnosis Date Noted   Symptomatic anemia 09/13/2020   COVID-19 virus infection 09/13/2020   Pressure injury of skin 06/29/2020   Gross hematuria 06/16/2020   Typical atrial  flutter ([place]) 03/24/2020   Bilateral pleural effusion 02/24/2020   Healthcare maintenance 01/28/2020   Shortness of breath 01/28/2020   History of partial ray amputation of fourth toe of right foot ([place]) 09/08/2019   Ulcerated, foot, right, with necrosis of bone ([place])    Chronic cough 06/25/2019   Cutaneous abscess of right foot    Subacute osteomyelitis of right foot ([place]) 12/14/2018   AKI (acute kidney injury) ([place]) 12/14/2018   ESRD (end stage renal disease) ([place]) 12/14/2018   Gait abnormality 08/24/2018   Diabetic neuropathy ([place]) 02/06/2018   Cervical myelopathy ([place]) 02/06/2018   Onychomycosis 10/30/2017   Other spondylosis with radiculopathy, cervical region 01/27/2017   Midfoot ulcer, right, limited to breakdown of skin ([place]) 11/15/2016   Lateral epicondylitis, left elbow 08/12/2016   Cellulitis of fifth toe of right foot 07/29/2016   Prostate cancer ([place]) 06/19/2016   Non-pressure chronic ulcer of other part of right foot limited to breakdown of skin ([place]) 04/04/2016   Cellulitis of leg, right 12/24/2015   Cellulitis of right lower extremity 12/24/2015   Gout 09/14/2012   HTN (hypertension) 09/14/2012   Hypothyroidism 09/14/2012   CAD (coronary artery disease) 09/14/2012   Diabetes mellitus ([place]) 09/14/2012   Hypercholesteremia    Neuropathy ([place])    Past Medical History:  Diagnosis Date   Ambulates with cane    straight cane   Cervical myelopathy ([place]) 02/06/2018   Chronic kidney disease    dailysis M W F- home   Complication of anesthesia    Coronary artery disease    Diabetes mellitus without complication ([place])    type2   Diabetic foot ulcer ([place])    Diabetic neuropathy ([place]) 02/06/2018   Gait abnormality 08/24/2018   GERD (gastroesophageal reflux disease)     01/06/20- not current   Gout    History of kidney stones    passed stones   Hypercholesteremia    Hypertension    Hypothyroidism    Neuromuscular disorder ([place])    neuropathy left leg and bilateral  feet   Neuropathy    PONV (postoperative nausea and vomiting)    Prostate cancer ([place])    PVD (peripheral vascular disease) ([place])    with amputations    Family History  Problem Relation Age of Onset   Diabetes Mellitus II Mother    Kidney disease Mother    Diabetes Mellitus II Father    CAD Father    Cancer Father        prostate   Kidney disease Father    Diabetes Mellitus II Brother    Kidney disease Brother    Diabetes Mellitus II Brother    Stomach cancer Brother 11   Kidney disease Brother    Colon cancer Neg Hx  Colon polyps Neg Hx    Esophageal cancer Neg Hx    Rectal cancer Neg Hx    Pancreatic cancer Neg Hx     Past Surgical History:  Procedure Laterality Date   A-FLUTTER ABLATION N/A 04/06/2020   Procedure: A-FLUTTER ABLATION;  Surgeon: [name], MD;  Location: [place] [facility];  Service: Cardiovascular;  Laterality: N/A;   AMPUTATION Left 12/25/2013   Procedure: AMPUTATION RAY LEFT 5TH RAY;  Surgeon: [name], MD;  [facility];  Service: Orthopedics;  Laterality: Left;   AMPUTATION Right 12/15/2018   Procedure: AMPUTATION OF 4TH AND 5TH TOES RIGHT FOOT;  Surgeon: [name], MD;  Location: [place];  Service: Orthopedics;  Laterality: Right;   APPLICATION OF WOUND VAC Right 12/15/2018   Procedure: APPLICATION OF WOUND VAC;  Surgeon: [name], MD;  Location: [place];  Service: Orthopedics;  Laterality: Right;   AV FISTULA PLACEMENT Left 03/25/2019   Procedure: LEFT ARM ARTERIOVENOUS (AV) FISTULA CREATION;  Surgeon: [name], MD;  Location: [place] [facility];  Service: Vascular;  Laterality: Left;   [place] Left 05/20/2019   Procedure: SECOND STAGE LEFT BASCILIC VEIN TRANSPOSITION;  Surgeon: [name], MD;  Location: [place] [facility] [facility];  Service: Vascular;  Laterality: Left;   CARDIAC CATHETERIZATION  02/17/2014   CHOLECYSTECTOMY     COLONOSCOPY  2011   in [place], [place]  2008    CYSTOSCOPY N/A 06/29/2020   Procedure: [place];  Surgeon: [name], MD;  Location: [place];  Service: Urology;  Laterality: N/A;   CYSTOSCOPY WITH FULGERATION Bilateral 06/17/2020   Procedure: CYSTOSCOPY,BILATERAL RETROGRADE, CLOT EVACUATION WITH FULGERATION OF THE BLADDER;  Surgeon: Janith Lima, MD;  Location: [place];  Service: Urology;  Laterality: Bilateral;   CYSTOSCOPY WITH FULGERATION N/A 06/28/2020   Procedure: CYSTOSCOPY WITH CLOT EVACUATION AND FULGERATION OF BLEEDERS;  Surgeon: [name], MD;  Location: [place];  Service: Urology;  Laterality: N/A;  1 HR   I & D EXTREMITY Right 12/15/2018   Procedure: DEBRIDEMENT RIGHT FOOT;  Surgeon: [name], MD;  Location: [place];  Service: Orthopedics;  Laterality: Right;   I & D EXTREMITY Right 08/27/2019   Procedure: PARTIAL CUBOID EXCISION RIGHT FOOT;  Surgeon: [name], MD;  Location: [place];  Service: Orthopedics;  Laterality: Right;   I & D EXTREMITY Right 01/07/2020   Procedure: RIGHT FOOT EXCISION INFECTED BONE;  Surgeon: [name], MD;  Location: [place];  Service: Orthopedics;  Laterality: Right;   NECK SURGERY     novemver 2019   TRANSURETHRAL RESECTION OF PROSTATE N/A 06/28/2020   Procedure: TRANSURETHRAL RESECTION OF THE PROSTATE (TURP);  Surgeon: [name], MD;  Location: [place];  Service: Urology;  Laterality: N/A;   WISDOM TOOTH EXTRACTION     Social History   Occupational History   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

## 2021-02-01 NOTE — Progress Notes (Addendum)
Mr. Steagall denies chest pain or shortness of breath. Patient denies having any s/s of Covid in her household.  Patient denies any known exposure to Covid.   Mr. Joycie Peek has type II diabetes, patient reports that CBG's run up to 150.  I instructed patient to check CBG after awaking and every 2 hours until arrival  to the hospital.  I Instructed patient if CBG is less than 70 to take 4 Glucose Tablets or 1 tube of Glucose Gel or 1/2 cup of a clear juice. Recheck CBG in 15 minutes if CBG is not over 70 call, pre- op desk at [phone] for further instructions. I instructed Mr. Morgenstern to not take Ozempic before surgery.  Mr. Martel does home dialysis 4 days a week. I instructed patient to shower with antibiotic soap, if it is available.  Dry off with a clean towel. Do not put lotion, powder, cologne or deodorant or makeup.No jewelry or piercings. Men may shave their face and neck. Woman should not shave. No nail polish, artificial or acrylic nails. Wear clean clothes, brush your teeth. Glasses, contact lens,dentures or partials may not be worn in the OR. If you need to wear them, please bring a case for glasses, do not wear contacts or bring a case, the hospital does not have contact cases, dentures or partials will have to be removed , make sure they are clean, we will provide a denture cup to put them in. You will need some one to drive you home and a responsible person over the age of 34 to stay with you for the first 24 hours after surgery.

## 2021-02-01 NOTE — Telephone Encounter (Signed)
Pt requested wheelchair for after surgery. Order entered in parachute and will hold this message to monitor processing.

## 2021-02-02 ENCOUNTER — Encounter (HOSPITAL_COMMUNITY)
Admission: RE | Disposition: A | Discharge status: Home Health | Payer: Self-pay | Source: Home / Self Care | Attending: Orthopedic Surgery

## 2021-02-02 ENCOUNTER — Ambulatory Visit (HOSPITAL_COMMUNITY): Payer: Medicare Other | Admitting: Anesthesiology

## 2021-02-02 ENCOUNTER — Inpatient Hospital Stay (HOSPITAL_COMMUNITY)
Admission: RE | Admit: 2021-02-02 | Discharge: 2021-02-06 | DRG: 616 | Disposition: A | Discharge status: Home Health | Payer: Medicare Other | Attending: Orthopedic Surgery | Admitting: Orthopedic Surgery

## 2021-02-02 ENCOUNTER — Encounter: Payer: Medicare Other | Admitting: Rehabilitative and Restorative Service Providers"

## 2021-02-02 DIAGNOSIS — Z8546 Personal history of malignant neoplasm of prostate: Secondary | ICD-10-CM

## 2021-02-02 DIAGNOSIS — Z951 Presence of aortocoronary bypass graft: Secondary | ICD-10-CM

## 2021-02-02 DIAGNOSIS — Z89421 Acquired absence of other right toe(s): Secondary | ICD-10-CM | POA: Diagnosis present

## 2021-02-02 DIAGNOSIS — Z833 Family history of diabetes mellitus: Secondary | ICD-10-CM

## 2021-02-02 DIAGNOSIS — L97529 Non-pressure chronic ulcer of other part of left foot with unspecified severity: Secondary | ICD-10-CM | POA: Diagnosis present

## 2021-02-02 DIAGNOSIS — E1122 Type 2 diabetes mellitus with diabetic chronic kidney disease: Secondary | ICD-10-CM | POA: Diagnosis present

## 2021-02-02 DIAGNOSIS — Z841 Family history of disorders of kidney and ureter: Secondary | ICD-10-CM

## 2021-02-02 DIAGNOSIS — D649 Anemia, unspecified: Secondary | ICD-10-CM

## 2021-02-02 DIAGNOSIS — E1141 Type 2 diabetes mellitus with diabetic mononeuropathy: Secondary | ICD-10-CM | POA: Diagnosis present

## 2021-02-02 DIAGNOSIS — Z9102 Food additives allergy status: Secondary | ICD-10-CM

## 2021-02-02 DIAGNOSIS — Z9049 Acquired absence of other specified parts of digestive tract: Secondary | ICD-10-CM

## 2021-02-02 DIAGNOSIS — Z992 Dependence on renal dialysis: Secondary | ICD-10-CM

## 2021-02-02 DIAGNOSIS — Z923 Personal history of irradiation: Secondary | ICD-10-CM

## 2021-02-02 DIAGNOSIS — M86172 Other acute osteomyelitis, left ankle and foot: Secondary | ICD-10-CM | POA: Diagnosis not present

## 2021-02-02 DIAGNOSIS — N2581 Secondary hyperparathyroidism of renal origin: Secondary | ICD-10-CM | POA: Diagnosis present

## 2021-02-02 DIAGNOSIS — L02612 Cutaneous abscess of left foot: Secondary | ICD-10-CM | POA: Diagnosis not present

## 2021-02-02 DIAGNOSIS — Z79899 Other long term (current) drug therapy: Secondary | ICD-10-CM

## 2021-02-02 DIAGNOSIS — N186 End stage renal disease: Secondary | ICD-10-CM | POA: Diagnosis present

## 2021-02-02 DIAGNOSIS — K219 Gastro-esophageal reflux disease without esophagitis: Secondary | ICD-10-CM | POA: Diagnosis present

## 2021-02-02 DIAGNOSIS — M109 Gout, unspecified: Secondary | ICD-10-CM | POA: Diagnosis present

## 2021-02-02 DIAGNOSIS — M71072 Abscess of bursa, left ankle and foot: Secondary | ICD-10-CM | POA: Diagnosis present

## 2021-02-02 DIAGNOSIS — I120 Hypertensive chronic kidney disease with stage 5 chronic kidney disease or end stage renal disease: Secondary | ICD-10-CM | POA: Diagnosis present

## 2021-02-02 DIAGNOSIS — E7800 Pure hypercholesterolemia, unspecified: Secondary | ICD-10-CM | POA: Diagnosis present

## 2021-02-02 DIAGNOSIS — Z8249 Family history of ischemic heart disease and other diseases of the circulatory system: Secondary | ICD-10-CM

## 2021-02-02 DIAGNOSIS — U071 COVID-19: Secondary | ICD-10-CM | POA: Diagnosis present

## 2021-02-02 DIAGNOSIS — L03116 Cellulitis of left lower limb: Secondary | ICD-10-CM | POA: Diagnosis present

## 2021-02-02 DIAGNOSIS — Z87891 Personal history of nicotine dependence: Secondary | ICD-10-CM

## 2021-02-02 DIAGNOSIS — E1169 Type 2 diabetes mellitus with other specified complication: Secondary | ICD-10-CM | POA: Diagnosis present

## 2021-02-02 DIAGNOSIS — E039 Hypothyroidism, unspecified: Secondary | ICD-10-CM | POA: Diagnosis present

## 2021-02-02 DIAGNOSIS — Z79890 Hormone replacement therapy (postmenopausal): Secondary | ICD-10-CM

## 2021-02-02 DIAGNOSIS — I2510 Atherosclerotic heart disease of native coronary artery without angina pectoris: Secondary | ICD-10-CM | POA: Diagnosis present

## 2021-02-02 DIAGNOSIS — R110 Nausea: Principal | ICD-10-CM

## 2021-02-02 DIAGNOSIS — Z87442 Personal history of urinary calculi: Secondary | ICD-10-CM

## 2021-02-02 DIAGNOSIS — E1151 Type 2 diabetes mellitus with diabetic peripheral angiopathy without gangrene: Secondary | ICD-10-CM | POA: Diagnosis present

## 2021-02-02 DIAGNOSIS — E11621 Type 2 diabetes mellitus with foot ulcer: Principal | ICD-10-CM | POA: Diagnosis present

## 2021-02-02 HISTORY — PX: AMPUTATION: SHX166

## 2021-02-02 HISTORY — DX: Personal history of other medical treatment: Z92.89

## 2021-02-02 LAB — RENAL FUNCTION PANEL
Albumin: 3.3 g/dL — ABNORMAL LOW (ref 3.5–5.0)
Anion gap: 16 — ABNORMAL HIGH (ref 5–15)
BUN: 48 mg/dL — ABNORMAL HIGH (ref 8–23)
CO2: 26 mmol/L (ref 22–32)
Calcium: 8.7 mg/dL — ABNORMAL LOW (ref 8.9–10.3)
Chloride: 91 mmol/L — ABNORMAL LOW (ref 98–111)
Creatinine, Ser: 5.53 mg/dL — ABNORMAL HIGH (ref 0.61–1.24)
GFR, Estimated: 10 mL/min — ABNORMAL LOW (ref >60)
Glucose, Bld: 193 mg/dL — ABNORMAL HIGH (ref 70–99)
Phosphorus: 5 mg/dL — ABNORMAL HIGH (ref 2.5–4.6)
Potassium: 4 mmol/L (ref 3.5–5.1)
Sodium: 133 mmol/L — ABNORMAL LOW (ref 135–145)

## 2021-02-02 LAB — CBC
HCT: 32.4 % — ABNORMAL LOW (ref 39.0–52.0)
Hemoglobin: 11.1 g/dL — ABNORMAL LOW (ref 13.0–17.0)
MCH: 30.1 pg (ref 26.0–34.0)
MCHC: 34.3 g/dL (ref 30.0–36.0)
MCV: 87.8 fL (ref 80.0–100.0)
Platelets: 220 10*3/uL (ref 150–400)
RBC: 3.69 MIL/uL — ABNORMAL LOW (ref 4.22–5.81)
RDW: 15 % (ref 11.5–15.5)
WBC: 11.2 10*3/uL — ABNORMAL HIGH (ref 4.0–10.5)
nRBC: 0 % (ref 0.0–0.2)

## 2021-02-02 LAB — HEPATITIS B SURFACE ANTIBODY,QUALITATIVE: Hep B S Ab: NONREACTIVE

## 2021-02-02 LAB — SARS CORONAVIRUS 2 BY RT PCR (HOSPITAL ORDER, PERFORMED IN ~~LOC~~ HOSPITAL LAB): SARS Coronavirus 2: POSITIVE — AB

## 2021-02-02 LAB — GLUCOSE, CAPILLARY
Glucose-Capillary: 124 mg/dL — ABNORMAL HIGH (ref 70–99)
Glucose-Capillary: 181 mg/dL — ABNORMAL HIGH (ref 70–99)

## 2021-02-02 LAB — HEMOGLOBIN A1C
Hgb A1c MFr Bld: 6.3 % — ABNORMAL HIGH (ref 4.8–5.6)
Mean Plasma Glucose: 134.11 mg/dL

## 2021-02-02 LAB — HEPATITIS B SURFACE ANTIGEN: Hepatitis B Surface Ag: NONREACTIVE

## 2021-02-02 SURGERY — AMPUTATION, FOOT, RAY
Anesthesia: Monitor Anesthesia Care | Laterality: Left

## 2021-02-02 MED ORDER — OXYCODONE HCL 5 MG PO TABS: 5.0000 mg | ORAL_TABLET | ORAL | Status: DC | PRN

## 2021-02-02 MED ORDER — POTASSIUM CHLORIDE CRYS ER 20 MEQ PO TBCR: 20.0000 meq | EXTENDED_RELEASE_TABLET | Freq: Every day | ORAL | Status: DC | PRN

## 2021-02-02 MED ORDER — SODIUM CHLORIDE 0.9 % IV SOLN: 100.0000 mL | INTRAVENOUS | Status: DC | PRN

## 2021-02-02 MED ORDER — SODIUM CHLORIDE 0.9 % IV SOLN: 500.0000 mL | INTRAVENOUS | Status: DC

## 2021-02-02 MED ORDER — CHLORHEXIDINE GLUCONATE CLOTH 2 % EX PADS: 6.0000 | MEDICATED_PAD | Freq: Every day | CUTANEOUS | Status: DC

## 2021-02-02 MED ORDER — SODIUM CHLORIDE 0.9 % IV SOLN: INTRAVENOUS | Status: DC | PRN

## 2021-02-02 MED ORDER — PANTOPRAZOLE SODIUM 40 MG PO TBEC
40.0000 mg | DELAYED_RELEASE_TABLET | Freq: Every day | ORAL | Status: DC
  Administered 2021-02-03 – 2021-02-06 (×4): 40 mg via ORAL
  Filled 2021-02-02 (×5): qty 1

## 2021-02-02 MED ORDER — PHENOL 1.4 % MT LIQD: 1.0000 | OROMUCOSAL | Status: DC | PRN

## 2021-02-02 MED ORDER — LIDOCAINE HCL 1 % IJ SOLN
INTRAMUSCULAR | Status: DC | PRN
  Administered 2021-02-02: 30 mL via INTRADERMAL

## 2021-02-02 MED ORDER — PANTOPRAZOLE SODIUM 40 MG PO TBEC: 40.0000 mg | DELAYED_RELEASE_TABLET | Freq: Every day | ORAL | Status: DC

## 2021-02-02 MED ORDER — LOSARTAN POTASSIUM 50 MG PO TABS
100.0000 mg | ORAL_TABLET | Freq: Every day | ORAL | Status: DC
  Administered 2021-02-02 – 2021-02-06 (×4): 100 mg via ORAL
  Filled 2021-02-02 (×4): qty 2

## 2021-02-02 MED ORDER — EPHEDRINE 5 MG/ML INJ
INTRAVENOUS | Status: AC
  Filled 2021-02-02: qty 10

## 2021-02-02 MED ORDER — ALLOPURINOL 100 MG PO TABS
200.0000 mg | ORAL_TABLET | Freq: Every day | ORAL | Status: DC
  Administered 2021-02-02 – 2021-02-06 (×5): 200 mg via ORAL
  Filled 2021-02-02 (×6): qty 2

## 2021-02-02 MED ORDER — FENTANYL CITRATE (PF) 100 MCG/2ML IJ SOLN
INTRAMUSCULAR | Status: AC
  Filled 2021-02-02: qty 2

## 2021-02-02 MED ORDER — GUAIFENESIN-DM 100-10 MG/5ML PO SYRP: 15.0000 mL | ORAL_SOLUTION | ORAL | Status: DC | PRN

## 2021-02-02 MED ORDER — ONDANSETRON HCL 4 MG/2ML IJ SOLN
INTRAMUSCULAR | Status: DC | PRN
  Administered 2021-02-02: 4 mg via INTRAVENOUS

## 2021-02-02 MED ORDER — LIDOCAINE-PRILOCAINE 2.5-2.5 % EX CREA
1.0000 "application " | TOPICAL_CREAM | CUTANEOUS | Status: DC | PRN
  Filled 2021-02-02: qty 5

## 2021-02-02 MED ORDER — PROCHLORPERAZINE MALEATE 5 MG PO TABS
5.0000 mg | ORAL_TABLET | Freq: Two times a day (BID) | ORAL | Status: DC
  Administered 2021-02-02 – 2021-02-06 (×6): 5 mg via ORAL
  Filled 2021-02-02 (×8): qty 1

## 2021-02-02 MED ORDER — INSULIN ASPART 100 UNIT/ML IJ SOLN
0.0000 [IU] | Freq: Three times a day (TID) | INTRAMUSCULAR | Status: DC
  Administered 2021-02-03: 3 [IU] via SUBCUTANEOUS
  Administered 2021-02-03 – 2021-02-04 (×2): 2 [IU] via SUBCUTANEOUS
  Administered 2021-02-04: 1 [IU] via SUBCUTANEOUS
  Administered 2021-02-04: 3 [IU] via SUBCUTANEOUS
  Administered 2021-02-05: 1 [IU] via SUBCUTANEOUS
  Administered 2021-02-05: 2 [IU] via SUBCUTANEOUS
  Administered 2021-02-05: 1 [IU] via SUBCUTANEOUS
  Administered 2021-02-06: 2 [IU] via SUBCUTANEOUS

## 2021-02-02 MED ORDER — CALCITRIOL 0.5 MCG PO CAPS
0.5000 ug | ORAL_CAPSULE | Freq: Every day | ORAL | Status: DC
  Administered 2021-02-02 – 2021-02-06 (×4): 0.5 ug via ORAL
  Filled 2021-02-02 (×5): qty 1

## 2021-02-02 MED ORDER — SEVELAMER CARBONATE 800 MG PO TABS
800.0000 mg | ORAL_TABLET | Freq: Three times a day (TID) | ORAL | Status: DC
  Administered 2021-02-03 – 2021-02-06 (×8): 800 mg via ORAL
  Filled 2021-02-02 (×9): qty 1

## 2021-02-02 MED ORDER — PREGABALIN 75 MG PO CAPS
150.0000 mg | ORAL_CAPSULE | Freq: Every day | ORAL | Status: DC
  Administered 2021-02-04 – 2021-02-06 (×3): 150 mg via ORAL
  Filled 2021-02-02 (×4): qty 2

## 2021-02-02 MED ORDER — JUVEN PO PACK
1.0000 | PACK | Freq: Two times a day (BID) | ORAL | Status: DC
  Administered 2021-02-05 – 2021-02-06 (×2): 1 via ORAL
  Filled 2021-02-02 (×4): qty 1

## 2021-02-02 MED ORDER — HYDROMORPHONE HCL 1 MG/ML IJ SOLN
INTRAMUSCULAR | Status: AC
  Filled 2021-02-02: qty 1

## 2021-02-02 MED ORDER — INSULIN ASPART 100 UNIT/ML IJ SOLN
3.0000 [IU] | Freq: Three times a day (TID) | INTRAMUSCULAR | Status: DC
  Administered 2021-02-03 – 2021-02-06 (×8): 3 [IU] via SUBCUTANEOUS

## 2021-02-02 MED ORDER — FAMOTIDINE 20 MG PO TABS: 10.0000 mg | ORAL_TABLET | Freq: Every day | ORAL | Status: DC

## 2021-02-02 MED ORDER — HYDROMORPHONE HCL 1 MG/ML IJ SOLN
0.5000 mg | INTRAMUSCULAR | Status: DC | PRN
  Administered 2021-02-02 – 2021-02-06 (×14): 1 mg via INTRAVENOUS
  Filled 2021-02-02 (×15): qty 1

## 2021-02-02 MED ORDER — MIDAZOLAM HCL 2 MG/2ML IJ SOLN
INTRAMUSCULAR | Status: AC
  Filled 2021-02-02: qty 2

## 2021-02-02 MED ORDER — PREGABALIN 100 MG PO CAPS
300.0000 mg | ORAL_CAPSULE | Freq: Every day | ORAL | Status: DC
  Administered 2021-02-02 – 2021-02-05 (×4): 300 mg via ORAL
  Filled 2021-02-02 (×4): qty 3

## 2021-02-02 MED ORDER — ONDANSETRON HCL 4 MG/2ML IJ SOLN: 4.0000 mg | Freq: Four times a day (QID) | INTRAMUSCULAR | Status: DC | PRN

## 2021-02-02 MED ORDER — CHLORHEXIDINE GLUCONATE 0.12 % MT SOLN
15.0000 mL | Freq: Once | OROMUCOSAL | Status: AC
  Administered 2021-02-02: 15 mL via OROMUCOSAL
  Filled 2021-02-02: qty 15

## 2021-02-02 MED ORDER — ACETAMINOPHEN 325 MG PO TABS
325.0000 mg | ORAL_TABLET | Freq: Four times a day (QID) | ORAL | Status: DC | PRN
  Administered 2021-02-03: 650 mg via ORAL
  Filled 2021-02-02: qty 2

## 2021-02-02 MED ORDER — POLYETHYLENE GLYCOL 3350 17 G PO PACK: 17.0000 g | PACK | Freq: Every day | ORAL | Status: DC | PRN

## 2021-02-02 MED ORDER — ONDANSETRON HCL 4 MG/2ML IJ SOLN
INTRAMUSCULAR | Status: AC
  Filled 2021-02-02: qty 2

## 2021-02-02 MED ORDER — LEVOTHYROXINE SODIUM 112 MCG PO TABS
112.0000 ug | ORAL_TABLET | Freq: Every day | ORAL | Status: DC
  Administered 2021-02-03 – 2021-02-06 (×4): 112 ug via ORAL
  Filled 2021-02-02 (×4): qty 1

## 2021-02-02 MED ORDER — ORAL CARE MOUTH RINSE: 15.0000 mL | Freq: Once | OROMUCOSAL | Status: AC

## 2021-02-02 MED ORDER — SODIUM CHLORIDE 0.9 % IV SOLN: INTRAVENOUS | Status: DC

## 2021-02-02 MED ORDER — MAGNESIUM CITRATE PO SOLN
1.0000 | Freq: Once | ORAL | Status: DC | PRN
  Filled 2021-02-02: qty 296

## 2021-02-02 MED ORDER — DOCUSATE SODIUM 100 MG PO CAPS
100.0000 mg | ORAL_CAPSULE | Freq: Every day | ORAL | Status: DC
  Administered 2021-02-04 – 2021-02-06 (×3): 100 mg via ORAL
  Filled 2021-02-02 (×3): qty 1

## 2021-02-02 MED ORDER — ACETAMINOPHEN 500 MG PO TABS
1000.0000 mg | ORAL_TABLET | Freq: Once | ORAL | Status: AC
  Administered 2021-02-02: 1000 mg via ORAL
  Filled 2021-02-02: qty 2

## 2021-02-02 MED ORDER — HYDRALAZINE HCL 20 MG/ML IJ SOLN
5.0000 mg | INTRAMUSCULAR | Status: DC | PRN
  Administered 2021-02-05: 5 mg via INTRAVENOUS
  Filled 2021-02-02: qty 1

## 2021-02-02 MED ORDER — ALUM & MAG HYDROXIDE-SIMETH 200-200-20 MG/5ML PO SUSP: 15.0000 mL | ORAL | Status: DC | PRN

## 2021-02-02 MED ORDER — ASCORBIC ACID 500 MG PO TABS
1000.0000 mg | ORAL_TABLET | Freq: Every day | ORAL | Status: DC
  Administered 2021-02-02 – 2021-02-06 (×5): 1000 mg via ORAL
  Filled 2021-02-02 (×6): qty 2

## 2021-02-02 MED ORDER — OXYCODONE HCL 5 MG PO TABS
10.0000 mg | ORAL_TABLET | ORAL | Status: DC | PRN
  Filled 2021-02-02: qty 2

## 2021-02-02 MED ORDER — 0.9 % SODIUM CHLORIDE (POUR BTL) OPTIME
TOPICAL | Status: DC | PRN
  Administered 2021-02-02: 1000 mL

## 2021-02-02 MED ORDER — LABETALOL HCL 5 MG/ML IV SOLN: 10.0000 mg | INTRAVENOUS | Status: DC | PRN

## 2021-02-02 MED ORDER — ALTEPLASE 2 MG IJ SOLR
2.0000 mg | Freq: Once | INTRAMUSCULAR | Status: DC | PRN
  Filled 2021-02-02: qty 2

## 2021-02-02 MED ORDER — LIDOCAINE HCL (PF) 1 % IJ SOLN
5.0000 mL | INTRAMUSCULAR | Status: DC | PRN
  Filled 2021-02-02: qty 5

## 2021-02-02 MED ORDER — MAGNESIUM SULFATE 2 GM/50ML IV SOLN
2.0000 g | Freq: Every day | INTRAVENOUS | Status: DC | PRN
  Filled 2021-02-02: qty 50

## 2021-02-02 MED ORDER — BISACODYL 5 MG PO TBEC: 5.0000 mg | DELAYED_RELEASE_TABLET | Freq: Every day | ORAL | Status: DC | PRN

## 2021-02-02 MED ORDER — ROSUVASTATIN CALCIUM 5 MG PO TABS
10.0000 mg | ORAL_TABLET | Freq: Every day | ORAL | Status: DC
  Administered 2021-02-02 – 2021-02-06 (×5): 10 mg via ORAL
  Filled 2021-02-02 (×6): qty 2

## 2021-02-02 MED ORDER — PROPOFOL 500 MG/50ML IV EMUL
INTRAVENOUS | Status: DC | PRN
  Administered 2021-02-02: 75 ug/kg/min via INTRAVENOUS

## 2021-02-02 MED ORDER — CEFAZOLIN SODIUM-DEXTROSE 2-3 GM-%(50ML) IV SOLR
INTRAVENOUS | Status: DC | PRN
  Administered 2021-02-02: 2 g via INTRAVENOUS

## 2021-02-02 MED ORDER — ZINC SULFATE 220 (50 ZN) MG PO CAPS
220.0000 mg | ORAL_CAPSULE | Freq: Every day | ORAL | Status: DC
  Administered 2021-02-02 – 2021-02-06 (×5): 220 mg via ORAL
  Filled 2021-02-02 (×6): qty 1

## 2021-02-02 MED ORDER — METOPROLOL TARTRATE 5 MG/5ML IV SOLN: 2.0000 mg | INTRAVENOUS | Status: DC | PRN

## 2021-02-02 MED ORDER — HYDROMORPHONE HCL 1 MG/ML IJ SOLN
0.2500 mg | INTRAMUSCULAR | Status: DC | PRN
  Administered 2021-02-02 (×3): 0.5 mg via INTRAVENOUS

## 2021-02-02 MED ORDER — PHENYLEPHRINE 40 MCG/ML (10ML) SYRINGE FOR IV PUSH (FOR BLOOD PRESSURE SUPPORT)
PREFILLED_SYRINGE | INTRAVENOUS | Status: AC
  Filled 2021-02-02: qty 20

## 2021-02-02 MED ORDER — PENTAFLUOROPROP-TETRAFLUOROETH EX AERO: 1.0000 "application " | INHALATION_SPRAY | CUTANEOUS | Status: DC | PRN

## 2021-02-02 MED ORDER — FENTANYL CITRATE (PF) 100 MCG/2ML IJ SOLN: 25.0000 ug | INTRAMUSCULAR | Status: DC | PRN

## 2021-02-02 MED ORDER — LIDOCAINE HCL (PF) 1 % IJ SOLN
INTRAMUSCULAR | Status: AC
  Filled 2021-02-02: qty 30

## 2021-02-02 MED ORDER — CEFAZOLIN SODIUM-DEXTROSE 2-4 GM/100ML-% IV SOLN
2.0000 g | Freq: Three times a day (TID) | INTRAVENOUS | Status: DC
  Administered 2021-02-02: 2 g via INTRAVENOUS
  Filled 2021-02-02: qty 100

## 2021-02-02 MED ORDER — CARVEDILOL 12.5 MG PO TABS
12.5000 mg | ORAL_TABLET | Freq: Two times a day (BID) | ORAL | Status: DC
  Administered 2021-02-03 – 2021-02-06 (×6): 12.5 mg via ORAL
  Filled 2021-02-02 (×6): qty 1

## 2021-02-02 SURGICAL SUPPLY — 28 items
BAG COUNTER SPONGE SURGICOUNT (BAG) ×2 IMPLANT
BLADE SAW SGTL MED 73X18.5 STR (BLADE) IMPLANT
BLADE SURG 21 STRL SS (BLADE) ×2 IMPLANT
BNDG COHESIVE 4X5 TAN STRL (GAUZE/BANDAGES/DRESSINGS) ×2 IMPLANT
BNDG COHESIVE 6X5 TAN NS LF (GAUZE/BANDAGES/DRESSINGS) ×2 IMPLANT
BNDG GAUZE ELAST 4 BULKY (GAUZE/BANDAGES/DRESSINGS) ×2 IMPLANT
COVER SURGICAL LIGHT HANDLE (MISCELLANEOUS) ×4 IMPLANT
DRAPE U-SHAPE 47X51 STRL (DRAPES) ×4 IMPLANT
DRSG ADAPTIC 3X8 NADH LF (GAUZE/BANDAGES/DRESSINGS) ×2 IMPLANT
DRSG PAD ABDOMINAL 8X10 ST (GAUZE/BANDAGES/DRESSINGS) ×4 IMPLANT
DURAPREP 26ML APPLICATOR (WOUND CARE) ×2 IMPLANT
ELECT REM PT RETURN 9FT ADLT (ELECTROSURGICAL) ×2
ELECTRODE REM PT RTRN 9FT ADLT (ELECTROSURGICAL) ×1 IMPLANT
GAUZE SPONGE 4X4 12PLY STRL (GAUZE/BANDAGES/DRESSINGS) ×2 IMPLANT
GLOVE SURG ORTHO LTX SZ9 (GLOVE) ×2 IMPLANT
GLOVE SURG UNDER POLY LF SZ9 (GLOVE) ×2 IMPLANT
GOWN STRL REUS W/ TWL XL LVL3 (GOWN DISPOSABLE) ×2 IMPLANT
GOWN STRL REUS W/TWL XL LVL3 (GOWN DISPOSABLE) ×2
KIT BASIN OR (CUSTOM PROCEDURE TRAY) ×2 IMPLANT
KIT TURNOVER KIT B (KITS) ×2 IMPLANT
NS IRRIG 1000ML POUR BTL (IV SOLUTION) ×2 IMPLANT
PACK ORTHO EXTREMITY (CUSTOM PROCEDURE TRAY) ×2 IMPLANT
PAD ARMBOARD 7.5X6 YLW CONV (MISCELLANEOUS) ×4 IMPLANT
STOCKINETTE IMPERVIOUS LG (DRAPES) IMPLANT
SUT ETHILON 2 0 PSLX (SUTURE) ×2 IMPLANT
TOWEL GREEN STERILE (TOWEL DISPOSABLE) ×2 IMPLANT
TUBE CONNECTING 12X1/4 (SUCTIONS) ×2 IMPLANT
YANKAUER SUCT BULB TIP NO VENT (SUCTIONS) ×2 IMPLANT

## 2021-02-02 NOTE — Telephone Encounter (Signed)
I called and lm on vm to advise of message below.  

## 2021-02-02 NOTE — Op Note (Signed)
02/02/2021  7:36 PM  PATIENT:  [name]    PRE-OPERATIVE DIAGNOSIS:  Osteomyelitis and Cellulitis Left Foot  POST-OPERATIVE DIAGNOSIS:  Same  PROCEDURE:  LEFT FOOT 4TH RAY AMPUTATION Local tissue rearrangement for wound closure 10 x 4 cm. Application of Prevena wound VAC.  SURGEON:  [name], MD  PHYSICIAN ASSISTANT:None ANESTHESIA:   General  PREOPERATIVE INDICATIONS:  [name] is a  70 y.o. male with a diagnosis of Osteomyelitis and Cellulitis Left Foot who failed conservative measures and elected for surgical management.    The risks benefits and alternatives were discussed with the patient preoperatively including but not limited to the risks of infection, bleeding, nerve injury, cardiopulmonary complications, the need for revision surgery, among others, and the patient was willing to proceed.  OPERATIVE IMPLANTS: 13 cm Prevena wound VAC  @ENCIMAGES @  OPERATIVE FINDINGS: Abscess extending proximally and dorsally around the fourth metatarsal.  Tissue margins were clear after debridement.  OPERATIVE PROCEDURE: Patient was brought to the operating room the left lower extremity was prepped using DuraPrep draped into a sterile field a timeout was called.  Local anesthesia was performed with 30 cc of 1% lidocaine plain.  A racquet incision was made around the ulcerative tissue and the fourth ray.  This left a wound that was 10 x 4 cm.  The fourth ray was resected through the midshaft.  There was extensive abscess around the fourth metatarsal head and this was debrided with a rondure.  Tissue margins were clear after debridement.  Local tissue rearrangement was used to close the wound 10 x 4 cm.  A Prevena wound VAC was applied this had a good suction fit patient was taken the PACU in stable condition.   DISCHARGE PLANNING:  Antibiotic duration: IV antibiotics for 24 hours  Weightbearing: Minimize weightbearing on the left  Pain medication: Opioid pathway  Dressing  care/ Wound VAC: Wound VAC for 1 week  Ambulatory devices: Walker  Discharge to: Anticipate discharge to home on Saturday.  Follow-up: In the office 1 week post operative.

## 2021-02-02 NOTE — Anesthesia Procedure Notes (Signed)
Procedure Name: MAC Date/Time: 02/02/2021 3:54 PM Performed by: Annamary Carolin, CRNA Pre-anesthesia Checklist: Patient identified, Emergency Drugs available, Suction available, Patient being monitored and Timeout performed Patient Re-evaluated:Patient Re-evaluated prior to induction Oxygen Delivery Method: Simple face mask Preoxygenation: Pre-oxygenation with 100% oxygen Induction Type: IV induction Dental Injury: Teeth and Oropharynx as per pre-operative assessment

## 2021-02-02 NOTE — H&P (Signed)
[name] is an 70 y.o. male.   Chief Complaint: Abscess ulceration fourth metatarsal head left foot. HPI: Patient is a 70 year old gentleman status post multiple ray amputations of the right foot status post fifth ray amputation left foot who presents with a new ulcer beneath the fourth metatarsal head left foot.  Patient has type 2 diabetes.  Past Medical History:  Diagnosis Date   Ambulates with cane    straight cane   Cervical myelopathy ([place]) 02/06/2018   Chronic kidney disease    dailysis M [facility] F- home   Complication of anesthesia    Coronary artery disease    Diabetes mellitus without complication ([place])    type2   Diabetic foot ulcer ([place])    Diabetic neuropathy ([place]) 02/06/2018   Gait abnormality 08/24/2018   GERD (gastroesophageal reflux disease)     01/06/20- not current   Gout    History of blood transfusion    History of kidney stones    passed stones   Hypercholesteremia    Hypertension    Hypothyroidism    Neuromuscular disorder (HCC)    neuropathy left leg and bilateral feet   Neuropathy    PONV (postoperative nausea and vomiting)    Prostate cancer (HCC)    PVD (peripheral vascular disease) ([place])    with amputations    Past Surgical History:  Procedure Laterality Date   A-FLUTTER ABLATION N/A 04/06/2020   Procedure: A-FLUTTER ABLATION;  Surgeon: [name], MD;  Location: [facility];  Service: Cardiovascular;  Laterality: N/A;   AMPUTATION Left 12/25/2013   Procedure: AMPUTATION RAY LEFT 5TH RAY;  Surgeon: [name], MD;  Location: [facility];  Service: Orthopedics;  Laterality: Left;   AMPUTATION Right 12/15/2018   Procedure: AMPUTATION OF 4TH AND 5TH TOES RIGHT FOOT;  Surgeon: [name], MD;  Location: [facility];  Service: Orthopedics;  Laterality: Right;   APPLICATION OF WOUND VAC Right 12/15/2018   Procedure: APPLICATION OF WOUND VAC;  Surgeon: [name], MD;  Location: [facility];  Service: Orthopedics;  Laterality: Right;   AV FISTULA  PLACEMENT Left 03/25/2019   Procedure: LEFT ARM ARTERIOVENOUS (AV) FISTULA CREATION;  Surgeon: [name], MD;  Location: [facility];  Service: Vascular;  Laterality: Left;   [place] Left 05/20/2019   Procedure: SECOND STAGE LEFT BASCILIC VEIN TRANSPOSITION;  Surgeon: [name], MD;  Location: [facility];  Service: Vascular;  Laterality: Left;   CARDIAC CATHETERIZATION  02/17/2014   CHOLECYSTECTOMY     COLONOSCOPY  2011   in [place], [place]  2008   CYSTOSCOPY N/A 06/29/2020   Procedure: [place];  Surgeon: [name], MD;  Location: [facility];  Service: Urology;  Laterality: N/A;   CYSTOSCOPY WITH FULGERATION Bilateral 06/17/2020   Procedure: CYSTOSCOPY,BILATERAL RETROGRADE, CLOT EVACUATION WITH FULGERATION OF THE BLADDER;  Surgeon: Janith Lima, MD;  Location: [facility];  Service: Urology;  Laterality: Bilateral;   CYSTOSCOPY WITH FULGERATION N/A 06/28/2020   Procedure: CYSTOSCOPY WITH CLOT EVACUATION AND FULGERATION OF BLEEDERS;  Surgeon: [name], MD;  Location: [facility];  Service: Urology;  Laterality: N/A;  1 HR   I & D EXTREMITY Right 12/15/2018   Procedure: DEBRIDEMENT RIGHT FOOT;  Surgeon: [name], MD;  Location: [facility];  Service: Orthopedics;  Laterality: Right;   I & D EXTREMITY Right 08/27/2019  Procedure: PARTIAL CUBOID EXCISION RIGHT FOOT;  Surgeon: [name], MD;  Location: [facility];  Service: Orthopedics;  Laterality: Right;   I & D EXTREMITY Right 01/07/2020   Procedure: RIGHT FOOT EXCISION INFECTED BONE;  Surgeon: [name], MD;  Location: [facility];  Service: Orthopedics;  Laterality: Right;   NECK SURGERY     novemver 2019   TRANSURETHRAL RESECTION OF PROSTATE N/A 06/28/2020   Procedure: TRANSURETHRAL RESECTION OF THE PROSTATE (TURP);  Surgeon: [name], MD;  Location: [facility];  Service: Urology;  Laterality: N/A;   WISDOM TOOTH EXTRACTION       Family History  Problem Relation Age of Onset   Diabetes Mellitus II Mother    Kidney disease Mother    Diabetes Mellitus II Father    CAD Father    Cancer Father        prostate   Kidney disease Father    Diabetes Mellitus II Brother    Kidney disease Brother    Diabetes Mellitus II Brother    Stomach cancer Brother 53   Kidney disease Brother    Colon cancer Neg Hx    Colon polyps Neg Hx    Esophageal cancer Neg Hx    Rectal cancer Neg Hx    Pancreatic cancer Neg Hx    Social History:  reports that he quit smoking about 32 years ago. His smoking use included cigars. He started smoking about 47 years ago. He has never used smokeless tobacco. He reports that he does not drink alcohol and does not use drugs.  Allergies:  Allergies  Allergen Reactions   Mushroom Extract Complex Nausea Only    Facility-Administered Medications Prior to Admission  Medication Dose Route Frequency Provider Last Rate Last Admin   0.9 %  sodium chloride infusion  500 mL Intravenous Continuous Irene Shipper, MD       Medications Prior to Admission  Medication Sig Dispense Refill   acetaminophen (TYLENOL) 500 MG tablet Take 1,000-2,000 mg by mouth 2 (two) times daily as needed for moderate pain.     allopurinol (ZYLOPRIM) 100 MG tablet Take 200 mg by mouth daily.      B Complex-C-Zn-Folic Acid (DIALYVITE 833 WITH ZINC) 0.8 MG TABS Take 1 tablet by mouth daily.     calcitRIOL (ROCALTROL) 0.5 MCG capsule Take 0.5 mcg by mouth daily.     carvedilol (COREG) 12.5 MG tablet Take 1 tablet (12.5 mg total) by mouth in the morning and at bedtime. 180 tablet 3   doxycycline (VIBRAMYCIN) 100 MG capsule Take 1 capsule (100 mg total) by mouth 2 (two) times daily. Take once by mouth twice daily for 2 weeks. 28 capsule 0   levothyroxine (SYNTHROID, LEVOTHROID) 112 MCG tablet Take 112 mcg by mouth daily before breakfast. BRAND NAME SYNTHROID     lidocaine-prilocaine (EMLA) cream Apply 1 application topically 3  (three) times a week.     losartan (COZAAR) 100 MG tablet Take 100 mg by mouth daily.     Polyethyl Glycol-Propyl Glycol (LUBRICANT EYE DROPS) 0.4-0.3 % SOLN Place 1-2 drops into both eyes 3 (three) times daily as needed (dry/irritated eyes.).     pregabalin (LYRICA) 150 MG capsule Take 1 capsule (150 mg total) by mouth in the morning and at bedtime. (Patient taking differently: Take 150-300 mg by mouth See admin instructions. Take 150 mg in the morning and 300 mg at bedtime) 60 capsule 0   prochlorperazine (COMPAZINE) 5 MG tablet TAKE 1 TABLET BY  MOUTH TWICE A DAY 60 tablet 3   rosuvastatin (CRESTOR) 10 MG tablet Take 10 mg by mouth daily.     Semaglutide,0.25 or 0.5MG /DOS, (OZEMPIC, 0.25 OR 0.5 MG/DOSE,) 2 MG/1.5ML SOPN Inject 0.5 mg into the skin once a week.     sevelamer carbonate (RENVELA) 800 MG tablet Take 800 mg by mouth 3 (three) times daily.     famotidine (PEPCID) 20 MG tablet TAKE 1 TABLET BY MOUTH AFTER SUPPER (Patient not taking: Reported on 02/01/2021) 90 tablet 3   Lancets (ONETOUCH DELICA PLUS [id number]) MISC      pantoprazole (PROTONIX) 40 MG tablet TAKE 1 TABLET BY MOUTH EVERY DAY (Patient not taking: Reported on 02/01/2021) 90 tablet 2    No results found for this or any previous visit (from the past 48 hour(s)). No results found.  Review of Systems  All other systems reviewed and are negative.  There were no vitals taken for this visit. Physical Exam  Examination patient is alert oriented no adenopathy well-dressed normal affect normal respiratory effort.  Patient has a necrotic ulcer beneath the fourth metatarsal head.  There is ascending cellulitis through the midfoot.  Ulcer probes to bone. Assessment/Plan Assessment: Abscess ulceration cellulitis and osteomyelitis left foot fourth metatarsal head.  Plan: We will plan for a left foot fourth ray amputation.  Risks and benefits were discussed including risk of the wound not healing need for additional surgery.  Plan  for admission after surgery with dialysis postoperatively.  [name], MD 02/02/2021, 2:49 PM

## 2021-02-02 NOTE — Anesthesia Preprocedure Evaluation (Addendum)
Anesthesia Evaluation  Patient identified by MRN, date of birth, ID band Patient awake    Reviewed: Allergy & Precautions, NPO status , Patient's Chart, lab work & pertinent test results, reviewed documented beta blocker date and time   History of Anesthesia Complications (+) PONV  Airway Mallampati: III  TM Distance: >3 FB Neck ROM: Full    Dental  (+) Teeth Intact, Dental Advisory Given   Pulmonary neg pulmonary ROS, former smoker,    Pulmonary exam normal breath sounds clear to auscultation       Cardiovascular hypertension, Pt. on home beta blockers and Pt. on medications + CAD, + CABG (2008) and + Peripheral Vascular Disease  Normal cardiovascular exam Rhythm:Regular Rate:Normal     Neuro/Psych negative neurological ROS  negative psych ROS   GI/Hepatic Neg liver ROS, GERD  Controlled and Medicated,  Endo/Other  diabetesHypothyroidism   Renal/GU ESRF and DialysisRenal disease (dialysis [facility])  negative genitourinary   Musculoskeletal  (+) Arthritis ,   Abdominal   Peds  Hematology negative hematology ROS (+)   Anesthesia Other Findings   Reproductive/Obstetrics                            Anesthesia Physical Anesthesia Plan  ASA: 3  Anesthesia Plan: MAC   Post-op Pain Management:    Induction: Intravenous  PONV Risk Score and Plan: 2 and Propofol infusion, Treatment may vary due to age or medical condition, Midazolam and Ondansetron  Airway Management Planned: Natural Airway  Additional Equipment:   Intra-op Plan:   Post-operative Plan:   Informed Consent: I have reviewed the patients History and Physical, chart, labs and discussed the procedure including the risks, benefits and alternatives for the proposed anesthesia with the patient or authorized representative who has indicated his/her understanding and acceptance.     Dental advisory given  Plan Discussed  with: CRNA  Anesthesia Plan Comments:         Anesthesia Quick Evaluation

## 2021-02-02 NOTE — Anesthesia Postprocedure Evaluation (Signed)
Anesthesia Post Note  Patient: [name]  Procedure(s) Performed: LEFT FOOT 4TH RAY AMPUTATION (Left)     Patient location during evaluation: PACU Anesthesia Type: MAC Level of consciousness: awake and alert Pain management: pain level controlled Vital Signs Assessment: post-procedure vital signs reviewed and stable Respiratory status: spontaneous breathing, nonlabored ventilation, respiratory function stable and patient connected to nasal cannula oxygen Cardiovascular status: stable and blood pressure returned to baseline Postop Assessment: no apparent nausea or vomiting Anesthetic complications: no   No notable events documented.  Last Vitals:  Vitals:   02/02/21 1730 02/02/21 1745  BP: (!) 166/72 128/87  Pulse: 68 71  Resp: 20 16  Temp:  36.4 C  SpO2: 100% 100%    Last Pain:  Vitals:   02/02/21 1714  PainSc: 10-Worst pain ever                 Heddy Vidana L Rosi Secrist

## 2021-02-02 NOTE — Transfer of Care (Signed)
Immediate Anesthesia Transfer of Care Note  Patient: [name]  Procedure(s) Performed: LEFT FOOT 4TH RAY AMPUTATION (Left)  Patient Location: PACU  Anesthesia Type:General  Level of Consciousness: awake, alert  and patient cooperative  Airway & Oxygen Therapy: Patient Spontanous Breathing and Patient connected to face mask  Post-op Assessment: Report given to RN, Post -op Vital signs reviewed and stable and Patient moving all extremities X 4  Post vital signs: Reviewed and stable  Last Vitals:  Vitals Value Taken Time  BP 109/47 02/02/21 1620  Temp    Pulse 65 02/02/21 1623  Resp 16 02/02/21 1623  SpO2 100 % 02/02/21 1623  Vitals shown include unvalidated device data.  Last Pain:  Vitals:   02/02/21 1512  PainSc: 5       Patients Stated Pain Goal: 3 (32/20/25 4270)  Complications: No notable events documented.

## 2021-02-02 NOTE — Telephone Encounter (Signed)
Was advised by parachute that pt does not qualify for a wheelchair as received one 12/20/19 and insurance will only cover one every 5 years so will not be eligible until 12/19/24. Order has been cancelled. Will hold and advise pt.

## 2021-02-03 ENCOUNTER — Encounter (HOSPITAL_COMMUNITY): Payer: Self-pay | Admitting: Orthopedic Surgery

## 2021-02-03 DIAGNOSIS — I120 Hypertensive chronic kidney disease with stage 5 chronic kidney disease or end stage renal disease: Secondary | ICD-10-CM | POA: Diagnosis present

## 2021-02-03 DIAGNOSIS — Z89421 Acquired absence of other right toe(s): Secondary | ICD-10-CM | POA: Diagnosis not present

## 2021-02-03 DIAGNOSIS — E1122 Type 2 diabetes mellitus with diabetic chronic kidney disease: Secondary | ICD-10-CM | POA: Diagnosis present

## 2021-02-03 DIAGNOSIS — Z87442 Personal history of urinary calculi: Secondary | ICD-10-CM | POA: Diagnosis not present

## 2021-02-03 DIAGNOSIS — M86172 Other acute osteomyelitis, left ankle and foot: Secondary | ICD-10-CM | POA: Diagnosis present

## 2021-02-03 DIAGNOSIS — E1151 Type 2 diabetes mellitus with diabetic peripheral angiopathy without gangrene: Secondary | ICD-10-CM | POA: Diagnosis present

## 2021-02-03 DIAGNOSIS — Z951 Presence of aortocoronary bypass graft: Secondary | ICD-10-CM | POA: Diagnosis not present

## 2021-02-03 DIAGNOSIS — E7800 Pure hypercholesterolemia, unspecified: Secondary | ICD-10-CM | POA: Diagnosis present

## 2021-02-03 DIAGNOSIS — E039 Hypothyroidism, unspecified: Secondary | ICD-10-CM | POA: Diagnosis present

## 2021-02-03 DIAGNOSIS — L97529 Non-pressure chronic ulcer of other part of left foot with unspecified severity: Secondary | ICD-10-CM | POA: Diagnosis present

## 2021-02-03 DIAGNOSIS — E1169 Type 2 diabetes mellitus with other specified complication: Secondary | ICD-10-CM | POA: Diagnosis present

## 2021-02-03 DIAGNOSIS — N2581 Secondary hyperparathyroidism of renal origin: Secondary | ICD-10-CM | POA: Diagnosis present

## 2021-02-03 DIAGNOSIS — Z992 Dependence on renal dialysis: Secondary | ICD-10-CM | POA: Diagnosis not present

## 2021-02-03 DIAGNOSIS — Z8546 Personal history of malignant neoplasm of prostate: Secondary | ICD-10-CM | POA: Diagnosis not present

## 2021-02-03 DIAGNOSIS — M109 Gout, unspecified: Secondary | ICD-10-CM | POA: Diagnosis present

## 2021-02-03 DIAGNOSIS — N186 End stage renal disease: Secondary | ICD-10-CM | POA: Diagnosis present

## 2021-02-03 DIAGNOSIS — L03116 Cellulitis of left lower limb: Secondary | ICD-10-CM | POA: Diagnosis present

## 2021-02-03 DIAGNOSIS — E1141 Type 2 diabetes mellitus with diabetic mononeuropathy: Secondary | ICD-10-CM | POA: Diagnosis present

## 2021-02-03 DIAGNOSIS — Z833 Family history of diabetes mellitus: Secondary | ICD-10-CM | POA: Diagnosis not present

## 2021-02-03 DIAGNOSIS — M71072 Abscess of bursa, left ankle and foot: Secondary | ICD-10-CM | POA: Diagnosis present

## 2021-02-03 DIAGNOSIS — E11621 Type 2 diabetes mellitus with foot ulcer: Secondary | ICD-10-CM | POA: Diagnosis present

## 2021-02-03 DIAGNOSIS — U071 COVID-19: Secondary | ICD-10-CM | POA: Diagnosis present

## 2021-02-03 DIAGNOSIS — I2510 Atherosclerotic heart disease of native coronary artery without angina pectoris: Secondary | ICD-10-CM | POA: Diagnosis present

## 2021-02-03 DIAGNOSIS — K219 Gastro-esophageal reflux disease without esophagitis: Secondary | ICD-10-CM | POA: Diagnosis present

## 2021-02-03 DIAGNOSIS — Z9049 Acquired absence of other specified parts of digestive tract: Secondary | ICD-10-CM | POA: Diagnosis not present

## 2021-02-03 LAB — BASIC METABOLIC PANEL
Anion gap: 16 — ABNORMAL HIGH (ref 5–15)
BUN: 53 mg/dL — ABNORMAL HIGH (ref 8–23)
CO2: 22 mmol/L (ref 22–32)
Calcium: 8.2 mg/dL — ABNORMAL LOW (ref 8.9–10.3)
Chloride: 92 mmol/L — ABNORMAL LOW (ref 98–111)
Creatinine, Ser: 6.64 mg/dL — ABNORMAL HIGH (ref 0.61–1.24)
GFR, Estimated: 8 mL/min — ABNORMAL LOW (ref >60)
Glucose, Bld: 169 mg/dL — ABNORMAL HIGH (ref 70–99)
Potassium: 3.9 mmol/L (ref 3.5–5.1)
Sodium: 130 mmol/L — ABNORMAL LOW (ref 135–145)

## 2021-02-03 LAB — CBC
HCT: 27.6 % — ABNORMAL LOW (ref 39.0–52.0)
Hemoglobin: 9.1 g/dL — ABNORMAL LOW (ref 13.0–17.0)
MCH: 29.8 pg (ref 26.0–34.0)
MCHC: 33 g/dL (ref 30.0–36.0)
MCV: 90.5 fL (ref 80.0–100.0)
Platelets: 205 10*3/uL (ref 150–400)
RBC: 3.05 MIL/uL — ABNORMAL LOW (ref 4.22–5.81)
RDW: 15.5 % (ref 11.5–15.5)
WBC: 12.4 10*3/uL — ABNORMAL HIGH (ref 4.0–10.5)
nRBC: 0 % (ref 0.0–0.2)

## 2021-02-03 LAB — GLUCOSE, CAPILLARY
Glucose-Capillary: 154 mg/dL — ABNORMAL HIGH (ref 70–99)
Glucose-Capillary: 207 mg/dL — ABNORMAL HIGH (ref 70–99)
Glucose-Capillary: 197 mg/dL — ABNORMAL HIGH (ref 70–99)

## 2021-02-03 MED ORDER — VANCOMYCIN VARIABLE DOSE PER UNSTABLE RENAL FUNCTION (PHARMACIST DOSING): Status: DC

## 2021-02-03 MED ORDER — VANCOMYCIN HCL IN DEXTROSE 1-5 GM/200ML-% IV SOLN
1000.0000 mg | Freq: Once | INTRAVENOUS | Status: AC
  Administered 2021-02-03: 1000 mg via INTRAVENOUS
  Filled 2021-02-03: qty 200

## 2021-02-03 MED ORDER — VANCOMYCIN HCL 1750 MG/350ML IV SOLN
1750.0000 mg | Freq: Once | INTRAVENOUS | Status: AC
  Administered 2021-02-03: 1750 mg via INTRAVENOUS
  Filled 2021-02-03: qty 350

## 2021-02-03 MED ORDER — SODIUM CHLORIDE 0.9 % IV SOLN
1.0000 g | INTRAVENOUS | Status: DC
  Administered 2021-02-03 – 2021-02-05 (×3): 1 g via INTRAVENOUS
  Filled 2021-02-03 (×4): qty 1

## 2021-02-03 NOTE — Plan of Care (Signed)
  Problem: Education: °Goal: Knowledge of the prescribed therapeutic regimen will improve °Outcome: Progressing °  °Problem: Activity: °Goal: Ability to perform//tolerate increased activity and mobilize with assistive devices will improve °Outcome: Progressing °  °Problem: Clinical Measurements: °Goal: Postoperative complications will be avoided or minimized °Outcome: Progressing °  °Problem: Self-Care: °Goal: Ability to meet self-care needs will improve °Outcome: Progressing °  °Problem: Self-Concept: °Goal: Ability to maintain and perform role responsibilities to the fullest extent possible will improve °Outcome: Progressing °  °Problem: Pain Management: °Goal: Pain level will decrease with appropriate interventions °Outcome: Progressing °  °Problem: Skin Integrity: °Goal: Demonstration of wound healing without infection will improve °Outcome: Progressing °  °

## 2021-02-03 NOTE — Evaluation (Signed)
Physical Therapy Evaluation Patient Details Name: [name] MRN: [id number] DOB: September 10, 1950 Today's Date: 02/03/2021  History of Present Illness  [name] is a  70 y.o. male with a diagnosis of Osteomyelitis and Cellulitis Left Foot who failed conservative measures underwent Lt 4th ray amputation. PMH; DM HTN, CKD on HD  Clinical Impression  Patient is s/p above surgery resulting in functional limitations due to the deficits listed below (see PT Problem List). Required min assist for transfers, balance, and to ambulate short distance in room. Pt reports wife will be home to assist as needed and has a ramp to enter his home. Gets dialysis at home with assist from wife. Would like HHPT which is certainly needed. Patient will benefit from skilled PT to increase their independence and safety with mobility to allow discharge to the venue listed below.          Recommendations for follow up therapy are one component of a multi-disciplinary discharge planning process, led by the attending physician.  Recommendations may be updated based on patient status, additional functional criteria and insurance authorization.  Follow Up Recommendations Home health PT    Assistance Recommended at Discharge Intermittent Supervision/Assistance  Functional Status Assessment Patient has had a recent decline in their functional status and demonstrates the ability to make significant improvements in function in a reasonable and predictable amount of time.  Equipment Recommendations  Rolling walker (2 wheels);Wheelchair (measurements PT) (Post-op shoe to protect foot with mobility.)   Recommendations for Other Services OT consult     Precautions / Restrictions Precautions Precautions: Fall Required Braces or Orthoses: Other Brace Other Brace: Wears bil AFO braces Restrictions Weight Bearing Restrictions: Yes LLE Weight Bearing: Non weight bearing      Mobility  Bed Mobility Overal bed mobility:  Modified Independent             General bed mobility comments: extra time    Transfers Overall transfer level: Needs assistance Equipment used: Rolling walker (2 wheels) Transfers: Sit to/from Stand Sit to Stand: Min assist           General transfer comment: Min assist for boost to stand from bed x2 and recliner chair. Educated on hand and foot placement between transitions to remain NWB on Lt, and for stability with RW upon standing. Pt unsteady requires hands on support when standing.    Ambulation/Gait Ambulation/Gait assistance: Min assist Gait Distance (Feet): 12 Feet Assistive device: Rolling walker (2 wheels) Gait Pattern/deviations:  (Hop-to) Gait velocity: slow Gait velocity interpretation: <1.31 ft/sec, indicative of household ambulator General Gait Details: Pt requires min assist for balance and to appropiately place RW forward and turn intermittently. Too close to front of walker, educated on proper use and sequencing and showing some decent carry over. The close proximity causing posterior instabiltiy and need for assist with balance early in distance. Cues to keep LLE NWB.  Stairs            Wheelchair Mobility    Modified Rankin (Stroke Patients Only)       Balance Overall balance assessment: Needs assistance Sitting-balance support: No upper extremity supported;Feet unsupported Sitting balance-Leahy Scale: Normal     Standing balance support: Bilateral upper extremity supported Standing balance-Leahy Scale: Poor                               Pertinent Vitals/Pain Pain Assessment: 0-10 Pain Score: 3  Pain Location: left foot  Pain Descriptors / Indicators: Aching Pain Intervention(s): Limited activity within patient's tolerance;Monitored during session;Premedicated before session;Repositioned    Home Living Family/patient expects to be discharged to:: Private residence Living Arrangements: Spouse/significant  other Available Help at Discharge: Family;Available 24 hours/day Type of Home: House Home Access: Stairs to enter;Ramped entrance Entrance Stairs-Rails: None Entrance Stairs-Number of Steps: 1   Home Layout: Multi-level;Laundry or work area in [employer]: [place] (4 wheels);Shower seat;Cane - single point;Other (comment) (states w/c broken, has knee walker) Additional Comments: wife assists with set up of home HD    Prior Function Prior Level of Function : Independent/Modified Independent;Driving;Working/employed (Works in an office)             Mobility Comments: walking with rollator and cane prior to this admission       Hand Dominance   Dominant Hand: Right    Extremity/Trunk Assessment   Upper Extremity Assessment Upper Extremity Assessment: Defer to OT evaluation    Lower Extremity Assessment Lower Extremity Assessment: Generalized weakness;LLE deficits/detail LLE Deficits / Details: Lt foot bandaged with wound vac LLE:  (bandaged)       Communication   Communication: No difficulties  Cognition Arousal/Alertness: Lethargic Behavior During Therapy: WFL for tasks assessed/performed Overall Cognitive Status: Within Functional Limits for tasks assessed                                          General Comments General comments (skin integrity, edema, etc.): Educated pt and wife on safety, NWB precautions.    Exercises General Exercises - Lower Extremity Ankle Circles/Pumps: AROM;10 reps;Seated   Assessment/Plan    PT Assessment Patient needs continued PT services  PT Problem List Decreased strength;Decreased range of motion;Decreased activity tolerance;Decreased balance;Decreased mobility;Decreased knowledge of use of DME;Decreased knowledge of precautions;Pain       PT Treatment Interventions DME instruction;Gait training;Functional mobility training;Therapeutic activities;Therapeutic exercise;Balance training;Neuromuscular  re-education;Patient/family education    PT Goals (Current goals can be found in the Care Plan section)  Acute Rehab PT Goals Patient Stated Goal: Go home PT Goal Formulation: With patient/family Time For Goal Achievement: 02/17/21 Potential to Achieve Goals: Good    Frequency Min 3X/week   Barriers to discharge        Co-evaluation               AM-PAC PT "6 Clicks" Mobility  Outcome Measure Help needed turning from your back to your side while in a flat bed without using bedrails?: None Help needed moving from lying on your back to sitting on the side of a flat bed without using bedrails?: None Help needed moving to and from a bed to a chair (including a wheelchair)?: A Little Help needed standing up from a chair using your arms (e.g., wheelchair or bedside chair)?: A Little Help needed to walk in hospital room?: A Little Help needed climbing 3-5 steps with a railing? : Total 6 Click Score: 18    End of Session Equipment Utilized During Treatment: Gait belt Activity Tolerance: Patient tolerated treatment well Patient left: in chair;with call bell/phone within reach;with chair alarm set;with family/visitor present Nurse Communication: Mobility status;Precautions PT Visit Diagnosis: Unsteadiness on feet (R26.81);Other abnormalities of gait and mobility (R26.89);Muscle weakness (generalized) (M62.81);Difficulty in walking, not elsewhere classified (R26.2);Pain Pain - Right/Left: Left Pain - part of body: Ankle and joints of foot    Time: 1005-1055 PT Time Calculation (  min) (ACUTE ONLY): 50 min   Charges:   PT Evaluation $PT Eval Moderate Complexity: 1 Mod PT Treatments $Gait Training: 8-22 mins $Therapeutic Activity: 8-22 mins        Elayne Snare, PT, DPT  [name] 02/03/2021, 11:20 AM

## 2021-02-03 NOTE — Progress Notes (Signed)
Patient ID: [name], male   DOB: [birth year], 70 y.o.   MRN: [id number] Patient is postoperative day one fourth ray amputation left foot.  Wound margins were grossly clear however patient did have a large abscess with cellulitis extending up the foot and cultures are showing multiple organisms.  I have ordered Maxipime and vancomycin to be administered per pharmacy recommendations.  Anticipate patient will receive dialysis today.  Patient does dialysis at home and most likely would have to go to a dialysis facility to receive IV antibiotics after discharge.

## 2021-02-03 NOTE — Progress Notes (Addendum)
Pharmacy Antibiotic Note  [name] is a 70 y.o. male admitted on 02/02/2021 with cellulitis.  Pharmacy has been consulted for cefepime and vancomycin dosing. Patient is s/p multiple ray amputation of the right foot, s/p fifth ray amputation of left foot who presents with new ulcer beneath fourth metatarsal left foot. Patient PMH of DM and on home HD MWF per notes. Current WBC 11.2 and HD planned post-op.   Plan: Cefepime 1 G Q24H  Vancomycin 1750 MG IV x1 followed by variable dosing per renal function and HD (plan for 1000 MG IV post HD sessions) Monitor plan for HD and clinical status  F/u updated weight  F/u duration of therapy      Temp (24hrs), Avg:98.3 F (36.8 C), Min:97.6 F (36.4 C), Max:99.1 F (37.3 C)  Recent Labs  Lab 02/02/21 2024 02/03/21 0242  WBC 11.2*  --   CREATININE 5.53* 6.64*    CrCl cannot be calculated (Unknown ideal weight.).    Allergies  Allergen Reactions   Mushroom Extract Complex Nausea Only    Antimicrobials this admission: 10/29 Cefepime >>  10/29 Vanc >>  Dose adjustments this admission: NA  Microbiology results: 10/19 WoundCx >> rare gram variable cocci, rare gram negative rod  Thank you for allowing pharmacy to be a part of this patient's care.  Cephus Slater, PharmD, MBA Pharmacy Resident [phone] 02/03/2021 8:51 AM

## 2021-02-03 NOTE — Progress Notes (Signed)
Called ortho tech 2x for post op shoe.

## 2021-02-03 NOTE — Consult Note (Addendum)
[facility] Renal Consultation Note  Indication for Consultation:  Management of ESRD/hemodialysis; anemia, hypertension/volume and secondary hyperparathyroidism  HPI: [name] is a 70 y.o. male with ESRD on home HD.(HD Monday, Wednesday, Thursday Sunday followed by Dr. Hollie Salk) [place] significant for DMT2, HTN, CAD,  hypothyroidism, prostate cancer s/p radiation therapy diabetic foot ulcers with amputations of the right foot status post fifth ray amputation with left foot.   Now admitted by Dr. [name] with a new ulcer WITH large abscess with cellulitis beneath the fourth metatarsal left foot noted to have osteomyelitis requiring left fourth ray amputation 02/02/2021.Marland Kitchen  Postop noted by Dr. [name] to have multiple organisms on pathology requiring more IV antibiotics and patient changed from observation status to admission because of this this a.m.  Last HD on schedule Thursday, patient using buttonholes on the upper portion of AV fistula and lower portion regular needle.  Currently denies shortness of breath, chest pain.      Past Medical History:  Diagnosis Date   Ambulates with cane    straight cane   Cervical myelopathy ([place]) 02/06/2018   Chronic kidney disease    dailysis M W F- home   Complication of anesthesia    Coronary artery disease    Diabetes mellitus without complication ([place])    type2   Diabetic foot ulcer ([place])    Diabetic neuropathy ([place]) 02/06/2018   Gait abnormality 08/24/2018   GERD (gastroesophageal reflux disease)     01/06/20- not current   Gout    History of blood transfusion    History of kidney stones    passed stones   Hypercholesteremia    Hypertension    Hypothyroidism    Neuromuscular disorder (HCC)    neuropathy left leg and bilateral feet   Neuropathy    PONV (postoperative nausea and vomiting)    Prostate cancer (HCC)    PVD (peripheral vascular disease) ([place])    with amputations    Past Surgical History:  Procedure Laterality Date    A-FLUTTER ABLATION N/A 04/06/2020   Procedure: A-FLUTTER ABLATION;  Surgeon: [name], MD;  Location: [facility];  Service: Cardiovascular;  Laterality: N/A;   AMPUTATION Left 12/25/2013   Procedure: AMPUTATION RAY LEFT 5TH RAY;  Surgeon: [name], MD;  Location: [facility];  Service: Orthopedics;  Laterality: Left;   AMPUTATION Right 12/15/2018   Procedure: AMPUTATION OF 4TH AND 5TH TOES RIGHT FOOT;  Surgeon: [name], MD;  Location: [facility];  Service: Orthopedics;  Laterality: Right;   AMPUTATION Left 02/02/2021   Procedure: LEFT FOOT 4TH RAY AMPUTATION;  Surgeon: [name], MD;  Location: [facility];  Service: Orthopedics;  Laterality: Left;   APPLICATION OF WOUND VAC Right 12/15/2018   Procedure: APPLICATION OF WOUND VAC;  Surgeon: [name], MD;  Location: [facility] [place];  Service: Orthopedics;  Laterality: Right;   AV FISTULA PLACEMENT Left 03/25/2019   Procedure: LEFT ARM ARTERIOVENOUS (AV) FISTULA CREATION;  Surgeon: [name], MD;  Location: [facility];  Service: Vascular;  Laterality: Left;   [place] Left 05/20/2019   Procedure: SECOND STAGE LEFT BASCILIC VEIN TRANSPOSITION;  Surgeon: [name], MD;  Location: [facility];  Service: Vascular;  Laterality: Left;   CARDIAC CATHETERIZATION  02/17/2014   CHOLECYSTECTOMY     COLONOSCOPY  2011   in [place], [place]  2008   CYSTOSCOPY N/A 06/29/2020  Procedure: CYSTOSCOPY WITH CLOT EVACUATION AND  FULGERATION;  Surgeon: [name], MD;  Location: [facility];  Service: Urology;  Laterality: N/A;   CYSTOSCOPY WITH FULGERATION Bilateral 06/17/2020   Procedure: CYSTOSCOPY,BILATERAL RETROGRADE, CLOT EVACUATION WITH FULGERATION OF THE BLADDER;  Surgeon: Janith Lima, MD;  Location: [facility];  Service: Urology;  Laterality: Bilateral;   CYSTOSCOPY WITH FULGERATION N/A 06/28/2020   Procedure: CYSTOSCOPY WITH CLOT EVACUATION AND FULGERATION OF BLEEDERS;   Surgeon: [name], MD;  Location: [facility];  Service: Urology;  Laterality: N/A;  1 HR   I & D EXTREMITY Right 12/15/2018   Procedure: DEBRIDEMENT RIGHT FOOT;  Surgeon: [name], MD;  Location: [facility];  Service: Orthopedics;  Laterality: Right;   I & D EXTREMITY Right 08/27/2019   Procedure: PARTIAL CUBOID EXCISION RIGHT FOOT;  Surgeon: [name], MD;  Location: [facility];  Service: Orthopedics;  Laterality: Right;   I & D EXTREMITY Right 01/07/2020   Procedure: RIGHT FOOT EXCISION INFECTED BONE;  Surgeon: [name], MD;  Location: [facility];  Service: Orthopedics;  Laterality: Right;   NECK SURGERY     novemver 2019   TRANSURETHRAL RESECTION OF PROSTATE N/A 06/28/2020   Procedure: TRANSURETHRAL RESECTION OF THE PROSTATE (TURP);  Surgeon: [name], MD;  Location: [facility];  Service: Urology;  Laterality: N/A;   WISDOM TOOTH EXTRACTION        Family History  Problem Relation Age of Onset   Diabetes Mellitus II Mother    Kidney disease Mother    Diabetes Mellitus II Father    CAD Father    Cancer Father        prostate   Kidney disease Father    Diabetes Mellitus II Brother    Kidney disease Brother    Diabetes Mellitus II Brother    Stomach cancer Brother 2   Kidney disease Brother    Colon cancer Neg Hx    Colon polyps Neg Hx    Esophageal cancer Neg Hx    Rectal cancer Neg Hx    Pancreatic cancer Neg Hx       reports that he quit smoking about 32 years ago. His smoking use included cigars. He started smoking about 47 years ago. He has never used smokeless tobacco. He reports that he does not drink alcohol and does not use drugs.   Allergies  Allergen Reactions   Mushroom Extract Complex Nausea Only    Prior to Admission medications   Medication Sig Start Date End Date Taking? Authorizing Provider  acetaminophen (TYLENOL) 500 MG tablet Take 1,000-2,000 mg by mouth 2 (two) times daily as needed for moderate pain.   Yes [provider]   allopurinol (ZYLOPRIM) 100 MG tablet Take 200 mg by mouth daily.  05/04/18  Yes [provider]  B Complex-C-Zn-Folic Acid (DIALYVITE 045 WITH ZINC) 0.8 MG TABS Take 1 tablet by mouth daily. 12/23/19  Yes [provider]  calcitRIOL (ROCALTROL) 0.5 MCG capsule Take 0.5 mcg by mouth daily. 01/09/21  Yes [provider]  carvedilol (COREG) 12.5 MG tablet Take 1 tablet (12.5 mg total) by mouth in the morning and at bedtime. 11/01/20  Yes Adrian Prows, MD  doxycycline (VIBRAMYCIN) 100 MG capsule Take 1 capsule (100 mg total) by mouth 2 (two) times daily. Take once by mouth twice daily for 2 weeks. 01/26/21  Yes [name], MD  levothyroxine (SYNTHROID, LEVOTHROID) 112 MCG tablet Take 112 mcg by mouth daily before breakfast. Marca Ancona  NAME SYNTHROID   Yes [provider]  lidocaine-prilocaine (EMLA) cream Apply 1 application topically 3 (three) times a week. 11/30/20  Yes [provider]  losartan (COZAAR) 100 MG tablet Take 100 mg by mouth daily.   Yes [provider]  pantoprazole (PROTONIX) 40 MG tablet TAKE 1 TABLET BY MOUTH EVERY DAY 11/10/20  Yes Irene Shipper, MD  Polyethyl Glycol-Propyl Glycol (LUBRICANT EYE DROPS) 0.4-0.3 % SOLN Place 1-2 drops into both eyes 3 (three) times daily as needed (dry/irritated eyes.).   Yes [provider]  pregabalin (LYRICA) 150 MG capsule Take 1 capsule (150 mg total) by mouth in the morning and at bedtime. Patient taking differently: Take 150-300 mg by mouth See admin instructions. Take 150 mg in the morning and 300 mg at bedtime 05/09/20  Yes Persons, Bevely Palmer, PA  prochlorperazine (COMPAZINE) 5 MG tablet TAKE 1 TABLET BY MOUTH TWICE A DAY 12/13/20  Yes Irene Shipper, MD  rosuvastatin (CRESTOR) 10 MG tablet Take 10 mg by mouth daily.   Yes [provider]  Semaglutide,0.25 or 0.5MG /DOS, (OZEMPIC, 0.25 OR 0.5 MG/DOSE,) 2 MG/1.5ML SOPN Inject 0.5 mg into the skin once a week.   Yes [provider]   sevelamer carbonate (RENVELA) 800 MG tablet Take 800 mg by mouth 3 (three) times daily. 01/15/21  Yes [provider]  famotidine (PEPCID) 20 MG tablet TAKE 1 TABLET BY MOUTH AFTER SUPPER Patient not taking: Reported on 02/01/2021 09/18/20   Tanda Rockers, MD  Lancets (ONETOUCH DELICA PLUS [id number]) [place]  01/17/20   [provider]      Results for orders placed or performed during the hospital encounter of 02/02/21 (from the past 48 hour(s))  SARS Coronavirus 2 by RT PCR (hospital order, performed in [facility] hospital lab) Nasopharyngeal Nasopharyngeal Swab     Status: Abnormal   Collection Time: 02/02/21  3:13 PM   Specimen: Nasopharyngeal Swab  Result Value Ref Range   SARS Coronavirus 2 POSITIVE (A) NEGATIVE    Comment: RESULT CALLED TO, READ BACK BY AND VERIFIED WITH: P LOPEZ RN 1308 02/02/21 A [name] (NOTE) SARS-CoV-2 target nucleic acids are DETECTED  SARS-CoV-2 RNA is generally detectable in upper respiratory specimens  during the acute phase of infection.  Positive results are indicative  of the presence of the identified virus, but do not rule out bacterial infection or co-infection with other pathogens not detected by the test.  Clinical correlation with patient history and  other diagnostic information is necessary to determine patient infection status.  The expected result is negative.  Fact Sheet for Patients:   StrictlyIdeas.no   Fact Sheet for Healthcare Providers:   BankingDealers.co.za    This test is not yet approved or cleared by the [place] FDA and  has been authorized for detection and/or diagnosis of SARS-CoV-2 by FDA under an Emergency Use Authorization (EUA).  This EUA will remain in effect (meaning this t est can be used) for the duration of  the COVID-19 declaration under Section 564(b)(1) of the Act, 21 U.S.C. section 360-bbb-3(b)(1), unless the authorization is terminated  or revoked sooner.  Performed at [facility], [place] [address], [place], [place] [address]   Aerobic/Anaerobic Culture w Gram Stain (surgical/deep wound)     Status: None (Preliminary result)   Collection Time: 02/02/21  4:00 PM   Specimen: PATH Digit amputation; Tissue  Result Value Ref Range   Specimen Description TISSUE    Special Requests LEFT 4TH  TOE AMPUTATION SPEC A    Gram Stain      NO WBC SEEN RARE GRAM VARIABLE COCCI RARE GRAM NEGATIVE RODS    Culture      CULTURE REINCUBATED FOR BETTER GROWTH Performed at [facility], [address] [place], [place] [address]    Report Status PENDING   Glucose, capillary     Status: Abnormal   Collection Time: 02/02/21  4:24 PM  Result Value Ref Range   Glucose-Capillary 124 (H) 70 - 99 mg/dL    Comment: Glucose reference range applies only to samples taken after fasting for at least 8 hours.   Comment 1 Notify RN   Hemoglobin A1c     Status: Abnormal   Collection Time: 02/02/21  8:24 PM  Result Value Ref Range   Hgb A1c MFr Bld 6.3 (H) 4.8 - 5.6 %    Comment: (NOTE) Pre diabetes:          5.7%-6.4%  Diabetes:              >6.4%  Glycemic control for   <7.0% adults with diabetes    Mean Plasma Glucose 134.11 mg/dL    Comment: Performed at [facility], [place] [address], [place], [place] [address]  Renal function panel     Status: Abnormal   Collection Time: 02/02/21  8:24 PM  Result Value Ref Range   Sodium 133 (L) 135 - 145 mmol/L   Potassium 4.0 3.5 - 5.1 mmol/L   Chloride 91 (L) 98 - 111 mmol/L   CO2 26 22 - 32 mmol/L   Glucose, Bld 193 (H) 70 - 99 mg/dL    Comment: Glucose reference range applies only to samples taken after fasting for at least 8 hours.   BUN 48 (H) 8 - 23 mg/dL   Creatinine, Ser 5.53 (H) 0.61 - 1.24 mg/dL   Calcium 8.7 (L) 8.9 - 10.3 mg/dL   Phosphorus 5.0 (H) 2.5 - 4.6 mg/dL   Albumin 3.3 (L) 3.5 - 5.0 g/dL   GFR, Estimated 10 (L) >60 mL/min    Comment: (NOTE) Calculated  using the CKD-EPI Creatinine Equation (2021)    Anion gap 16 (H) 5 - 15    Comment: Performed at [place] [address] [place], [place] [address]  CBC     Status: Abnormal   Collection Time: 02/02/21  8:24 PM  Result Value Ref Range   WBC 11.2 (H) 4.0 - 10.5 K/uL   RBC 3.69 (L) 4.22 - 5.81 MIL/uL   Hemoglobin 11.1 (L) 13.0 - 17.0 g/dL   HCT 32.4 (L) 39.0 - 52.0 %   MCV 87.8 80.0 - 100.0 fL   MCH 30.1 26.0 - 34.0 pg   MCHC 34.3 30.0 - 36.0 g/dL   RDW 15.0 11.5 - 15.5 %   Platelets 220 150 - 400 K/uL   nRBC 0.0 0.0 - 0.2 %    Comment: Performed at [facility], [place] [address] [place], [place] [address]  Hepatitis B surface antibody     Status: None   Collection Time: 02/02/21  8:26 PM  Result Value Ref Range   Hep B S Ab NON REACTIVE NON REACTIVE    Comment: (NOTE) Inconsistent with immunity, less than 10 mIU/mL.  Performed at [facility], [place] [address] [place],  [address]   Hepatitis B surface antigen     Status: None   Collection Time: 02/02/21  8:26 PM  Result Value Ref Range   Hepatitis B Surface Ag NON REACTIVE NON REACTIVE    Comment: Performed at [facility], [place] [address], [place], [place] [address]  Glucose, capillary     Status: Abnormal   Collection Time: 02/02/21  8:55 PM  Result Value Ref Range   Glucose-Capillary 181 (H) 70 - 99 mg/dL    Comment: Glucose reference range applies only to samples taken after fasting for at least 8 hours.  Basic metabolic panel     Status: Abnormal   Collection Time: 02/03/21  2:42 AM  Result Value Ref Range   Sodium 130 (L) 135 - 145 mmol/L   Potassium 3.9 3.5 - 5.1 mmol/L   Chloride 92 (L) 98 - 111 mmol/L   CO2 22 22 - 32 mmol/L   Glucose, Bld 169 (H) 70 - 99 mg/dL    Comment: Glucose reference range applies only to samples taken after fasting for at least 8 hours.   BUN 53 (H) 8 - 23 mg/dL   Creatinine, Ser 6.64 (H) 0.61 - 1.24 mg/dL   Calcium 8.2 (L) 8.9 - 10.3 mg/dL   GFR,  Estimated 8 (L) >60 mL/min    Comment: (NOTE) Calculated using the CKD-EPI Creatinine Equation (2021)    Anion gap 16 (H) 5 - 15    Comment: Performed at [place] [address] [place], [place] [address]  Glucose, capillary     Status: Abnormal   Collection Time: 02/03/21  6:46 AM  Result Value Ref Range   Glucose-Capillary 154 (H) 70 - 99 mg/dL    Comment: Glucose reference range applies only to samples taken after fasting for at least 8 hours.  .  ROS:  See HPI  Physical Exam: Vitals:   02/03/21 0134 02/03/21 0825  BP: (!) 112/47 (!) 136/57  Pulse: 79 77  Resp: 18 17  Temp: 99 F (37.2 C) 98.1 F (36.7 C)  SpO2: 96% 95%     General: Alert well-developed well-nourished male NAD HEENT: [place], EOMI, PERRLA, nonicteric Neck: Supple no JVD Heart: RRR no MRG Lungs: CTA nonlabored breathing Abdomen: NABS, NTND Extremities: No pedal edema, left lower extremity with dry dressing Skin: No acute rash warm dry dressing as above Neuro: Alert, oX 3, moves all extremities.  No acute focal deficits appreciated Dialysis Access: Upper arm AV fistula positive bruit thrill  Dialysis Orders: Home HD on M, W, Thursday, Sunday, 3 and half hours, EDW 89 kg per patient.  Access left upper arm AV fistula /BFR 350  Calcitriol 0.5 mcg p.o. daily  Assessment/Plan ESRD= plan to do HD today, then MWF Osteomyelitis cellulitis left foot status post= 10/28 Dr. [name] fourth ray amputation, IV antibiotics per admit Hypertension/volume  -no excess volume on exam, UF 1 L today BP stable BP meds carvedilol 12.5 mg twice daily losartan 100 g daily Anemia  -Hgb 11.1 no ESA given prior history of follow-up Hgb trend Metabolic bone disease -p.o. calcitriol and Renvela as binder phosphorus echo Nutrition -renal carb modified diet albumin 3.3 DM2= diet and Accu-Cheks Covid-19+ - appears to be an incidental finding.  Respiratory precautions in place.  Ernest Haber, PA-C [facility] Beeper [phone] 02/03/2021, 10:51 AM   I have seen and examined this patient and agree with plan and assessment in the above note with renal recommendations/intervention highlighted.  Plan for HD today then get back on MWF schedule. Will perform HD in isolation room on 5th floor.  Governor Rooks  Chonte Ricke,MD 02/03/2021 11:25 AM

## 2021-02-03 NOTE — Progress Notes (Signed)
MD, patient need order for glucose night coverage if he remains overnight. Cbg last night 181.

## 2021-02-03 NOTE — Progress Notes (Signed)
Orthopedic Tech Progress Note Patient Details:  [name] [birth year] [id number]   PO Shoe left at bedside as pt was at hemodialysis.   If it is apparent that a different size shoe is needed please call before applying and another size will be brought up.  Ortho Devices Type of Ortho Device: Postop shoe/boot Ortho Device/Splint Location: For LLE, left at bedside. Ortho Device/Splint Interventions: Ordered      Carin Primrose 02/03/2021, 4:22 PM

## 2021-02-04 LAB — BASIC METABOLIC PANEL
Anion gap: 10 (ref 5–15)
BUN: 29 mg/dL — ABNORMAL HIGH (ref 8–23)
CO2: 27 mmol/L (ref 22–32)
Calcium: 8.4 mg/dL — ABNORMAL LOW (ref 8.9–10.3)
Chloride: 97 mmol/L — ABNORMAL LOW (ref 98–111)
Creatinine, Ser: 4.73 mg/dL — ABNORMAL HIGH (ref 0.61–1.24)
GFR, Estimated: 12 mL/min — ABNORMAL LOW (ref >60)
Glucose, Bld: 185 mg/dL — ABNORMAL HIGH (ref 70–99)
Potassium: 4.3 mmol/L (ref 3.5–5.1)
Sodium: 134 mmol/L — ABNORMAL LOW (ref 135–145)

## 2021-02-04 LAB — GLUCOSE, CAPILLARY
Glucose-Capillary: 121 mg/dL — ABNORMAL HIGH (ref 70–99)
Glucose-Capillary: 204 mg/dL — ABNORMAL HIGH (ref 70–99)
Glucose-Capillary: 167 mg/dL — ABNORMAL HIGH (ref 70–99)
Glucose-Capillary: 200 mg/dL — ABNORMAL HIGH (ref 70–99)

## 2021-02-04 LAB — HEPATITIS B SURFACE ANTIBODY, QUANTITATIVE: Hep B S AB Quant (Post): 7.1 m[IU]/mL — ABNORMAL LOW (ref >9.9)

## 2021-02-04 MED ORDER — CHLORHEXIDINE GLUCONATE CLOTH 2 % EX PADS
6.0000 | MEDICATED_PAD | Freq: Every day | CUTANEOUS | Status: DC
  Administered 2021-02-04 – 2021-02-06 (×3): 6 via TOPICAL

## 2021-02-04 NOTE — Plan of Care (Signed)
  Problem: Education: Goal: Knowledge of the prescribed therapeutic regimen will improve Outcome: Progressing   Problem: Pain Management: Goal: Pain level will decrease with appropriate interventions Outcome: Progressing   Problem: Skin Integrity: Goal: Demonstration of wound healing without infection will improve Outcome: Progressing

## 2021-02-04 NOTE — Progress Notes (Addendum)
Subjective: Said tolerated dialysis yesterday, usually does home hemodialysis ,tomorrow wants to hold dialysis in case discharged.  Only complaint today is discomfort at foot OpSite.  Also he said he had multiple boosters does not why he is COVID-positive  Objective Vital signs in last 24 hours: Vitals:   02/03/21 1949 02/03/21 1954 02/03/21 2211 02/04/21 0744  BP: (!) 154/86 (!) 154/69 (!) 144/57 139/61  Pulse: 98 (!) 105 81 72  Resp:  20 18 17   Temp:  99.8 F (37.7 C)  98.3 F (36.8 C)  TempSrc:  Oral Oral Oral  SpO2:   95% 92%  Weight:       Weight change:   Physical Exam: General: Alert well-developed well-nourished male NAD HEENT: [place], EOMI, PERRLA, nonicteric Neck: Supple no JVD Heart: RRR no MRG Lungs: CTA nonlabored breathing Abdomen: NABS, NTND Extremities: No pedal edema, left lower extremity with dry dressing Skin: No acute rash warm dry dressing as above Neuro: Alert, oX 3, moves all extremities.  No acute focal deficits appreciated Dialysis Access: Upper arm AV fistula positive bruit thrill   Dialysis Orders: Home HD on M, W, Thursday, Sunday, 3 and half hours, EDW 89 kg per patient.  Access left upper arm AV fistula /BFR 350  Calcitriol 0.5 mcg p.o. daily    Problem/Plan: ESRD= does home HD (M, W, TH, S UN ) HD yesterday without difficulty.  A.m. labs K4.3 /volume okay.  Check a.m. lab ,he wants avoid HD tomorrow in case he is discharged so he can do it at home Osteomyelitis cellulitis left foot abscess status post= 10/28 Dr. [name] fourth ray amputation, noted per Dr. Due culture showing Enterococcus with sensitivities pending and he hopes to "discharge on p.o. antibiotics with sensitivities identified " IV vancomycin pharmacy dosing and cefepime Hypertension/volume  -no excess volume on exam, UF 1 L t tolerated yesterday , BP stable BP meds carvedilol 12.5 mg BID, Losartan 100 mg daily Anemia  -Hgb 11.1 no ESA given prior history of follow-up Hgb trend Metabolic  bone disease -p.o. calcitriol and Renvela as binder phosphorus 5.0, calcium corrected stable Nutrition -needs renal /carb modified diet albumin 3.3 DM2= diet and Accu-Checks COVID-19 positive= no respiratory or GI symptoms =use respiratory and HD isolation per patient has had vaccine and numerous boosters  Ernest Haber, PA-C [facility] Beeper [phone] 02/04/2021,11:16 AM  LOS: 1 day   Labs: Basic Metabolic Panel: Recent Labs  Lab 02/02/21 2024 02/03/21 0242 02/04/21 0111  NA 133* 130* 134*  K 4.0 3.9 4.3  CL 91* 92* 97*  CO2 26 22 27   GLUCOSE 193* 169* 185*  BUN 48* 53* 29*  CREATININE 5.53* 6.64* 4.73*  CALCIUM 8.7* 8.2* 8.4*  PHOS 5.0*  --   --    Liver Function Tests: Recent Labs  Lab 02/02/21 2024  ALBUMIN 3.3*   No results for input(s): LIPASE, AMYLASE in the last 168 hours. No results for input(s): AMMONIA in the last 168 hours. CBC: Recent Labs  Lab 02/02/21 2024 02/03/21 0242  WBC 11.2* 12.4*  HGB 11.1* 9.1*  HCT 32.4* 27.6*  MCV 87.8 90.5  PLT 220 205   Cardiac Enzymes: No results for input(s): CKTOTAL, CKMB, CKMBINDEX, TROPONINI in the last 168 hours. CBG: Recent Labs  Lab 02/02/21 2055 02/03/21 0646 02/03/21 1259 02/03/21 2234 02/04/21 0617  GLUCAP 181* 154* 207* 197* 121*    Studies/Results: No results found. Medications:  sodium chloride     sodium chloride     ceFEPime (  MAXIPIME) IV 1 g (02/04/21 0907)   magnesium sulfate bolus IVPB      allopurinol  200 mg Oral Daily   vitamin C  1,000 mg Oral Daily   calcitRIOL  0.5 mcg Oral Daily   carvedilol  12.5 mg Oral BID WC   docusate sodium  100 mg Oral Daily   insulin aspart  0-9 Units Subcutaneous TID WC   insulin aspart  3 Units Subcutaneous TID WC   levothyroxine  112 mcg Oral QAC breakfast   losartan  100 mg Oral Daily   nutrition supplement (JUVEN)  1 packet Oral BID BM   pantoprazole  40 mg Oral Daily   pregabalin  150 mg Oral Daily   pregabalin  300 mg  Oral QHS   prochlorperazine  5 mg Oral BID   rosuvastatin  10 mg Oral Daily   sevelamer carbonate  800 mg Oral TID WC   vancomycin variable dose per unstable renal function (pharmacist dosing)   Does not apply See admin instructions   zinc sulfate  220 mg Oral Daily   Physical exam: unable to complete due to COVID + status.  In order to preserve PPE equipment and to minimize exposure to providers.  Notes from other caregivers reviewed I have reviewed the medical records and agree with plan and assessment in the above note with renal recommendations/intervention highlighted. Will hold off on HD tomorrow so he can go home for his usual treatments and hopeful discharge to home.  [place] John A Hawley Michel,MD 02/04/2021 12:04 PM

## 2021-02-04 NOTE — Progress Notes (Signed)
Physical Therapy Treatment Patient Details Name: [name] MRN: [id number] DOB: [birth year] Today's Date: 02/04/2021   History of Present Illness [name] is a  70 y.o. male with a diagnosis of Osteomyelitis and Cellulitis Left Foot who failed conservative measures underwent Lt 4th ray amputation. PMH; DM HTN, CKD on HD    PT Comments    Demonstrates nice progress today with physical therapy. Able to perform bed mobility and transfer to standing position without physical assistance. Ambulates with min assist, requires cues to maintain NWB through LLE, educated on sequencing and safety. Pt has scooter and rolling knee walker. Son widening ramp for w/c. Eager to go home. Patient will continue to benefit from skilled physical therapy services to further improve independence with functional mobility.    Recommendations for follow up therapy are one component of a multi-disciplinary discharge planning process, led by the attending physician.  Recommendations may be updated based on patient status, additional functional criteria and insurance authorization.  Follow Up Recommendations  Home health PT     Assistance Recommended at Discharge Intermittent Supervision/Assistance  Equipment Recommendations  Rolling walker (2 wheels);Wheelchair (measurements PT)    Recommendations for Other Services OT consult     Precautions / Restrictions Precautions Precautions: Fall Required Braces or Orthoses: Other Brace Other Brace: Wears bil AFO braces baseline Restrictions Weight Bearing Restrictions: Yes LLE Weight Bearing: Non weight bearing     Mobility  Bed Mobility Overal bed mobility: Modified Independent             General bed mobility comments: extra time    Transfers Overall transfer level: Needs assistance Equipment used: Rolling walker (2 wheels) Transfers: Sit to/from Stand Sit to Stand: Min guard           General transfer comment: Close guard for safety today.  Performed from slightly elevated bed surface. Pt rushing due to wanting to watch football game but overall performed without physical assist.    Ambulation/Gait Ambulation/Gait assistance: Min assist Gait Distance (Feet): 18 Feet Assistive device: Rolling walker (2 wheels) Gait Pattern/deviations: Step-to pattern;Decreased stride length;Decreased dorsiflexion - right;Decreased dorsiflexion - left;Decreased weight shift to left (Hop-to) Gait velocity: slow   General Gait Details: min assist initially for RW placement and cues for stepping into middle of walker rather than stepping all of the way to the front of walker due to risk of posterior LOB. Pt able to correct and completed remaining distance in room including turn at [facility] level assist. Placed post-op shoe on for protection, cues required to remain NWB which pt. Noticeably better control today but still requires close CGA at all times.   Stairs             Wheelchair Mobility    Modified Rankin (Stroke Patients Only)       Balance Overall balance assessment: Needs assistance Sitting-balance support: No upper extremity supported;Feet unsupported Sitting balance-Leahy Scale: Normal     Standing balance support: Bilateral upper extremity supported Standing balance-Leahy Scale: Poor                              Cognition Arousal/Alertness: Awake/alert Behavior During Therapy: WFL for tasks assessed/performed Overall Cognitive Status: Within Functional Limits for tasks assessed  Exercises      General Comments        Pertinent Vitals/Pain Pain Assessment: 0-10 Pain Score: 2  Pain Location: left foot Pain Descriptors / Indicators: Aching Pain Intervention(s): Monitored during session;Repositioned    Home Living                          Prior Function            PT Goals (current goals can now be found in the care plan  section) Acute Rehab PT Goals Patient Stated Goal: Go home PT Goal Formulation: With patient/family Time For Goal Achievement: 02/17/21 Potential to Achieve Goals: Good Progress towards PT goals: Progressing toward goals    Frequency    Min 3X/week      PT Plan Current plan remains appropriate    Co-evaluation              AM-PAC PT "6 Clicks" Mobility   Outcome Measure  Help needed turning from your back to your side while in a flat bed without using bedrails?: None Help needed moving from lying on your back to sitting on the side of a flat bed without using bedrails?: None Help needed moving to and from a bed to a chair (including a wheelchair)?: A Little Help needed standing up from a chair using your arms (e.g., wheelchair or bedside chair)?: A Little Help needed to walk in hospital room?: A Little Help needed climbing 3-5 steps with a railing? : Total 6 Click Score: 18    End of Session Equipment Utilized During Treatment: Gait belt Activity Tolerance: Patient tolerated treatment well Patient left: with call bell/phone within reach;in bed;with bed alarm set Nurse Communication: Mobility status;Precautions PT Visit Diagnosis: Unsteadiness on feet (R26.81);Other abnormalities of gait and mobility (R26.89);Muscle weakness (generalized) (M62.81);Difficulty in walking, not elsewhere classified (R26.2);Pain Pain - Right/Left: Left Pain - part of body: Ankle and joints of foot     Time: 9518-8416 PT Time Calculation (min) (ACUTE ONLY): 16 min  Charges:  $Gait Training: 8-22 mins                     Candie Mile, PT, DPT   [name] 02/04/2021, 1:57 PM

## 2021-02-04 NOTE — Progress Notes (Signed)
PT Cancellation Note  Patient Details Name: [name] MRN: [id number] DOB: 07-30-1950   Cancelled Treatment:    Reason Eval/Treat Not Completed: Patient declined, no reason specified States he just returned to bed and would prefer to rest at this time. Will attempt to return this afternoon if time allows.  [name] 02/04/2021, 11:09 AM

## 2021-02-04 NOTE — Plan of Care (Signed)
  Problem: Education: Goal: Understanding of discharge needs will improve Outcome: Progressing   Problem: Clinical Measurements: Goal: Postoperative complications will be avoided or minimized Outcome: Progressing   Problem: Pain Management: Goal: Pain level will decrease with appropriate interventions Outcome: Progressing

## 2021-02-04 NOTE — Progress Notes (Signed)
Patient ID: [name], male   DOB: 1950/05/30, 70 y.o.   MRN: [id number] Patient has no drainage in the wound VAC canister.  Cultures are showing Enterococcus.  Plan to continue IV antibiotics and discharge on oral antibiotics once sensitivities are identified.

## 2021-02-05 ENCOUNTER — Ambulatory Visit: Payer: Medicare Other | Admitting: Orthopedic Surgery

## 2021-02-05 LAB — RENAL FUNCTION PANEL
Albumin: 2.5 g/dL — ABNORMAL LOW (ref 3.5–5.0)
Anion gap: 10 (ref 5–15)
BUN: 53 mg/dL — ABNORMAL HIGH (ref 8–23)
CO2: 25 mmol/L (ref 22–32)
Calcium: 8.5 mg/dL — ABNORMAL LOW (ref 8.9–10.3)
Chloride: 98 mmol/L (ref 98–111)
Creatinine, Ser: 6.16 mg/dL — ABNORMAL HIGH (ref 0.61–1.24)
GFR, Estimated: 9 mL/min — ABNORMAL LOW (ref >60)
Glucose, Bld: 212 mg/dL — ABNORMAL HIGH (ref 70–99)
Phosphorus: 5.1 mg/dL — ABNORMAL HIGH (ref 2.5–4.6)
Potassium: 4.4 mmol/L (ref 3.5–5.1)
Sodium: 133 mmol/L — ABNORMAL LOW (ref 135–145)

## 2021-02-05 LAB — GLUCOSE, CAPILLARY
Glucose-Capillary: 199 mg/dL — ABNORMAL HIGH (ref 70–99)
Glucose-Capillary: 171 mg/dL — ABNORMAL HIGH (ref 70–99)
Glucose-Capillary: 135 mg/dL — ABNORMAL HIGH (ref 70–99)
Glucose-Capillary: 242 mg/dL — ABNORMAL HIGH (ref 70–99)

## 2021-02-05 LAB — AEROBIC/ANAEROBIC CULTURE W GRAM STAIN (SURGICAL/DEEP WOUND): Gram Stain: NONE SEEN

## 2021-02-05 MED ORDER — SODIUM CHLORIDE 0.9 % IV SOLN
2.0000 g | INTRAVENOUS | Status: DC
  Administered 2021-02-05: 2 g via INTRAVENOUS
  Filled 2021-02-05: qty 2000

## 2021-02-05 MED ORDER — PROSOURCE PLUS PO LIQD
30.0000 mL | Freq: Two times a day (BID) | ORAL | Status: DC
  Filled 2021-02-05: qty 30

## 2021-02-05 MED ORDER — DARBEPOETIN ALFA 40 MCG/0.4ML IJ SOSY: 40.0000 ug | PREFILLED_SYRINGE | INTRAMUSCULAR | Status: DC

## 2021-02-05 NOTE — TOC Initial Note (Signed)
Transition of Care [facility]) - Initial/Assessment Note    Patient Details  Name: [name] MRN: [id number] Date of Birth: [birth year]  Transition of Care [facility]) CM/SW Contact:    Ninfa Meeker, RN Phone Number: 02/05/2021, 10:34 AM   Clinical Narrative:     Patient is a 70 year old gentleman who was admitted with acute cellulitis ulceration and drainage of left foot fourth metatarsal head. Underwent Left fourth toe amputation. Case Manager spoke with patient concerning recommendation for Home Health PT and DME. Patient has no preference for [facility] agency, referral called to [facility].   Expected Discharge Plan: [place] Barriers to Discharge: Continued Medical Work up   Patient Goals and CMS Choice Patient states their goals for this hospitalization and ongoing recovery are:: get better   Choice offered to / list presented to : Patient  Expected Discharge Plan and Services Expected Discharge Plan: [place] In-house Referral: NA Discharge Planning Services: CM Consult Post Acute Care Choice: Durable Medical Equipment, Home Health Living arrangements for the past 2 months: Single Family Home                 DME Arranged: Walker rolling, Wheelchair manual DME Agency: [facility]       HH Arranged: PT HH Agency: [facility] Date HH Agency Contacted: 02/05/21 Time HH Agency Contacted: 14 Representative spoke with at [place]: carolyn  Prior Living Arrangements/Services Living arrangements for the past 2 months: [place] Lives with:: Spouse Patient language and need for interpreter reviewed:: Yes Do you feel safe going back to the place where you live?: Yes      Need for Family Participation in Patient Care: Yes (Comment) Care giver support system in place?: Yes (comment)   Criminal Activity/Legal Involvement Pertinent to Current Situation/Hospitalization: No - Comment as needed  Activities of Daily  Living Home Assistive Devices/Equipment: CBG Meter, Blood pressure cuff ADL Screening (condition at time of admission) Patient's cognitive ability adequate to safely complete daily activities?: No Is the patient deaf or have difficulty hearing?: No Does the patient have difficulty seeing, even when wearing glasses/contacts?: No Does the patient have difficulty concentrating, remembering, or making decisions?: No Patient able to express need for assistance with ADLs?: Yes Does the patient have difficulty dressing or bathing?: No Independently performs ADLs?: Yes (appropriate for developmental age) Does the patient have difficulty walking or climbing stairs?: Yes Weakness of Legs: Both Weakness of Arms/Hands: None  Permission Sought/Granted Permission sought to share information with : Case Manager       Permission granted to share info w AGENCY: [place]( [place])        Emotional Assessment       Orientation: : Oriented to Self, Oriented to Place, Oriented to  Time, Oriented to Situation Alcohol / Substance Use: Not Applicable Psych Involvement: No (comment)  Admission diagnosis:  History of partial ray amputation of fourth toe of right foot ([place]) [Z89.421] Abscess of bursa of left foot [M71.072] Patient Active Problem List   Diagnosis Date Noted   Abscess of bursa of left foot 02/03/2021   Cutaneous abscess of left foot    Acute osteomyelitis of metatarsal bone of left foot ([place])    Symptomatic anemia 09/13/2020   COVID-19 virus infection 09/13/2020   Pressure injury of skin 06/29/2020   Gross hematuria 06/16/2020   Typical atrial flutter ([place]) 03/24/2020   Bilateral pleural effusion 02/24/2020   Healthcare maintenance 01/28/2020  Shortness of breath 01/28/2020   History of partial ray amputation of fourth toe of right foot ([place]) 09/08/2019   Ulcerated, foot, right, with necrosis of bone (HCC)    Chronic cough 06/25/2019   Cutaneous abscess of right foot     Subacute osteomyelitis of right foot ([place]) 12/14/2018   AKI (acute kidney injury) ([place]) 12/14/2018   ESRD (end stage renal disease) ([place]) 12/14/2018   Gait abnormality 08/24/2018   Diabetic neuropathy ([place]) 02/06/2018   Cervical myelopathy ([place]) 02/06/2018   Onychomycosis 10/30/2017   Other spondylosis with radiculopathy, cervical region 01/27/2017   Midfoot ulcer, right, limited to breakdown of skin ([place]) 11/15/2016   Lateral epicondylitis, left elbow 08/12/2016   Cellulitis of fifth toe of right foot 07/29/2016   Prostate cancer ([place]) 06/19/2016   Non-pressure chronic ulcer of other part of right foot limited to breakdown of skin ([place]) 04/04/2016   Cellulitis of leg, right 12/24/2015   Cellulitis of right lower extremity 12/24/2015   Gout 09/14/2012   HTN (hypertension) 09/14/2012   Hypothyroidism 09/14/2012   CAD (coronary artery disease) 09/14/2012   Diabetes mellitus ([place]) 09/14/2012   Hypercholesteremia    Neuropathy ([place])    PCP:  Lorenda Hatchet, FNP Pharmacy:   [id number] - [place], [address] AT [address] [place] [address] Phone: [phone] Fax: [phone]     Social Determinants of Health (SDOH) Interventions    Readmission Risk Interventions Readmission Risk Prevention Plan 12/16/2018  Transportation Screening Complete  PCP or Specialist Appt within 5-7 Days Complete  Home Care Screening Complete  Medication Review (RN CM) Complete  Some recent data might be hidden

## 2021-02-05 NOTE — Progress Notes (Signed)
Spoke to Town[place], pt's home HD RN, at [facility]. Sharyn Lull aware pt is hospitalized and tested positive for covid. Sharyn Lull requested lab result be faxed to clinic for records. Lab faxed this afternoon. Will advise clinic of d/c date once known. Will assist as needed.   Melven Sartorius Renal Navigator [phone]

## 2021-02-05 NOTE — Progress Notes (Signed)
Mobility Specialist Progress Note   02/05/21 1115  Mobility  Activity Refused mobility   Pt c/o being too exhausted to move and that they didn't get any sleep. Will come back around one to check again.   Holland Falling Mobility Specialist Phone Number [phone]

## 2021-02-05 NOTE — Progress Notes (Signed)
Patient ID: [name], male   DOB: 11-16-1950, 70 y.o.   MRN: [id number] Patient is status post left fourth ray amputation.  The wound VAC is functioning well no drainage patient does have a small ulcer over the base of the fifth metatarsal on the right foot which has recurrently episodes of breaking down currently 10 x 5 mm with healthy granulation tissue at the base.  Cultures are showing Enterococcus and diphtheroids.  Sensitivities are pending.  Patient insists on a regular diet and this was ordered.  Morning

## 2021-02-05 NOTE — Progress Notes (Signed)
[facility] Progress Note   Subjective: Seen in room. HD today. Doesn't understand why he tested positive for COVID.   Objective Vitals:   02/04/21 0744 02/04/21 1626 02/04/21 1955 02/04/21 2210  BP: 139/61 137/72  (!) 155/57  Pulse: 72 72  72  Resp: 17 17  18   Temp: 98.3 F (36.8 C)   98.2 F (36.8 C)  TempSrc: Oral     SpO2: 92%     Weight:      Height:   5\' 10"  (1.778 m)    Physical Exam General: Pleasant WN, WD elderly male in NAD Heart: RRR no MRG Lungs: CTA nonlabored breathing Abdomen: NABS, NTND Extremities: No LE edema L wound vac intact Dialysis Access: L AVF + T/B  Additional Objective Labs: Basic Metabolic Panel: Recent Labs  Lab 02/02/21 2024 02/03/21 0242 02/04/21 0111 02/05/21 0218  NA 133* 130* 134* 133*  K 4.0 3.9 4.3 4.4  CL 91* 92* 97* 98  CO2 26 22 27 25   GLUCOSE 193* 169* 185* 212*  BUN 48* 53* 29* 53*  CREATININE 5.53* 6.64* 4.73* 6.16*  CALCIUM 8.7* 8.2* 8.4* 8.5*  PHOS 5.0*  --   --  5.1*   Liver Function Tests: Recent Labs  Lab 02/02/21 2024 02/05/21 0218  ALBUMIN 3.3* 2.5*   No results for input(s): LIPASE, AMYLASE in the last 168 hours. CBC: Recent Labs  Lab 02/02/21 2024 02/03/21 0242  WBC 11.2* 12.4*  HGB 11.1* 9.1*  HCT 32.4* 27.6*  MCV 87.8 90.5  PLT 220 205   Blood Culture    Component Value Date/Time   SDES TISSUE 02/02/2021 1600   SPECREQUEST LEFT 4TH TOE AMPUTATION SPEC A 02/02/2021 1600   CULT  02/02/2021 1600    RARE ENTEROCOCCUS FAECALIS RARE DIPHTHEROIDS(CORYNEBACTERIUM SPECIES) Standardized susceptibility testing for this organism is not available. HOLDING FOR POSSIBLE ANAEROBE Performed at [facility], [place] [address] [place], [place] [address]    REPTSTATUS PENDING 02/02/2021 1600    Cardiac Enzymes: No results for input(s): CKTOTAL, CKMB, CKMBINDEX, TROPONINI in the last 168 hours. CBG: Recent Labs  Lab 02/04/21 0617 02/04/21 1124 02/04/21 1622 02/04/21 2034  02/05/21 0657  GLUCAP 121* 204* 167* 200* 199*   Iron Studies: No results for input(s): IRON, TIBC, TRANSFERRIN, FERRITIN in the last 72 hours. @lablastinr3 @ Studies/Results: No results found. Medications:  sodium chloride     sodium chloride     ceFEPime (MAXIPIME) IV Stopped (02/04/21 8250)   magnesium sulfate bolus IVPB      allopurinol  200 mg Oral Daily   vitamin C  1,000 mg Oral Daily   calcitRIOL  0.5 mcg Oral Daily   carvedilol  12.5 mg Oral BID WC   Chlorhexidine Gluconate Cloth  6 each Topical Daily   docusate sodium  100 mg Oral Daily   insulin aspart  0-9 Units Subcutaneous TID WC   insulin aspart  3 Units Subcutaneous TID WC   levothyroxine  112 mcg Oral QAC breakfast   losartan  100 mg Oral Daily   nutrition supplement (JUVEN)  1 packet Oral BID BM   pantoprazole  40 mg Oral Daily   pregabalin  150 mg Oral Daily   pregabalin  300 mg Oral QHS   prochlorperazine  5 mg Oral BID   rosuvastatin  10 mg Oral Daily   sevelamer carbonate  800 mg Oral TID WC   vancomycin variable dose per unstable renal function (pharmacist dosing)   Does not apply  See admin instructions   zinc sulfate  220 mg Oral Daily     Dialysis Orders: Home HD on M, W, Thursday, Sunday, 3 and half hours, EDW 89 kg per patient.  Access left upper arm AV fistula /BFR 350  Calcitriol 0.5 mcg p.o. daily     Assessment/Plan:  Osteomyelitis cellulitis left foot abscess S/P L 4th Ray Amputation 10/28 per Dr. [name]. Wound Cx +ENTEROCOCCUS FAECALIS . ABX per primary.  ESRD Home HD on NxStage. On MWF while in hospital.  HD today. No Heparin.  Hypertension/volume  - Anemia  -Hgb 11.1 down to 9.1 02/03/2021. Recheck HGB/Iron panel today. Aranesp 40 mcg IV with HD today.  Metabolic bone disease -EX5/T Ca at goal. Continue binders, VDRA.  Nutrition -Albumin 2.5. Add protein supps. Patient refusing renal diet. K+/PO4 OK.  DMT2: Per primary COVID-19 positive: Asymptomatic. Per primary  Jimmye [name]. Alberta Cairns  NP-C 02/05/2021, 10:40 AM  [employer] [phone]

## 2021-02-06 ENCOUNTER — Other Ambulatory Visit: Payer: Self-pay | Admitting: Orthopedic Surgery

## 2021-02-06 ENCOUNTER — Ambulatory Visit: Payer: Medicare Other | Admitting: Orthopedic Surgery

## 2021-02-06 LAB — CBC
HCT: 25.6 % — ABNORMAL LOW (ref 39.0–52.0)
Hemoglobin: 8.6 g/dL — ABNORMAL LOW (ref 13.0–17.0)
MCH: 29.5 pg (ref 26.0–34.0)
MCHC: 33.6 g/dL (ref 30.0–36.0)
MCV: 87.7 fL (ref 80.0–100.0)
Platelets: 156 10*3/uL (ref 150–400)
RBC: 2.92 MIL/uL — ABNORMAL LOW (ref 4.22–5.81)
RDW: 15.3 % (ref 11.5–15.5)
WBC: 7.7 10*3/uL (ref 4.0–10.5)
nRBC: 0 % (ref 0.0–0.2)

## 2021-02-06 LAB — GLUCOSE, CAPILLARY: Glucose-Capillary: 152 mg/dL — ABNORMAL HIGH (ref 70–99)

## 2021-02-06 LAB — RENAL FUNCTION PANEL
Albumin: 2.6 g/dL — ABNORMAL LOW (ref 3.5–5.0)
Anion gap: 12 (ref 5–15)
BUN: 71 mg/dL — ABNORMAL HIGH (ref 8–23)
CO2: 23 mmol/L (ref 22–32)
Calcium: 8.8 mg/dL — ABNORMAL LOW (ref 8.9–10.3)
Chloride: 102 mmol/L (ref 98–111)
Creatinine, Ser: 6.78 mg/dL — ABNORMAL HIGH (ref 0.61–1.24)
GFR, Estimated: 8 mL/min — ABNORMAL LOW (ref >60)
Glucose, Bld: 165 mg/dL — ABNORMAL HIGH (ref 70–99)
Phosphorus: 6 mg/dL — ABNORMAL HIGH (ref 2.5–4.6)
Potassium: 4.8 mmol/L (ref 3.5–5.1)
Sodium: 137 mmol/L (ref 135–145)

## 2021-02-06 LAB — POCT I-STAT, CHEM 8
BUN: 47 mg/dL — ABNORMAL HIGH (ref 8–23)
Calcium, Ion: 0.96 mmol/L — ABNORMAL LOW (ref 1.15–1.40)
Chloride: 91 mmol/L — ABNORMAL LOW (ref 98–111)
Creatinine, Ser: 6 mg/dL — ABNORMAL HIGH (ref 0.61–1.24)
Glucose, Bld: 147 mg/dL — ABNORMAL HIGH (ref 70–99)
HCT: 33 % — ABNORMAL LOW (ref 39.0–52.0)
Hemoglobin: 11.2 g/dL — ABNORMAL LOW (ref 13.0–17.0)
Potassium: 4.3 mmol/L (ref 3.5–5.1)
Sodium: 130 mmol/L — ABNORMAL LOW (ref 135–145)
TCO2: 28 mmol/L (ref 22–32)

## 2021-02-06 MED ORDER — LIDOCAINE HCL (PF) 1 % IJ SOLN: 5.0000 mL | INTRAMUSCULAR | Status: DC | PRN

## 2021-02-06 MED ORDER — SODIUM CHLORIDE 0.9 % IV SOLN: 100.0000 mL | INTRAVENOUS | Status: DC | PRN

## 2021-02-06 MED ORDER — AMOXICILLIN-POT CLAVULANATE 500-125 MG PO TABS: 1.0000 | ORAL_TABLET | Freq: Every day | ORAL | 0 refills | Status: DC

## 2021-02-06 MED ORDER — PENTAFLUOROPROP-TETRAFLUOROETH EX AERO: 1.0000 "application " | INHALATION_SPRAY | CUTANEOUS | Status: DC | PRN

## 2021-02-06 MED ORDER — OXYCODONE-ACETAMINOPHEN 5-325 MG PO TABS: 1.0000 | ORAL_TABLET | ORAL | 0 refills | Status: DC | PRN

## 2021-02-06 MED ORDER — HEPARIN SODIUM (PORCINE) 1000 UNIT/ML DIALYSIS: 1000.0000 [IU] | INTRAMUSCULAR | Status: DC | PRN

## 2021-02-06 MED ORDER — LIDOCAINE-PRILOCAINE 2.5-2.5 % EX CREA: 1.0000 "application " | TOPICAL_CREAM | CUTANEOUS | Status: DC | PRN

## 2021-02-06 MED ORDER — ALTEPLASE 2 MG IJ SOLR: 2.0000 mg | Freq: Once | INTRAMUSCULAR | Status: DC | PRN

## 2021-02-06 NOTE — Plan of Care (Signed)
  Problem: Education: Goal: Knowledge of the prescribed therapeutic regimen will improve Outcome: Progressing   

## 2021-02-06 NOTE — Progress Notes (Signed)
Patient ID: [name], male   DOB: [birth year], 70 y.o.   MRN: [id number] Cultures are sensitive to ampicillin.  Will discharge on Augmentin 500 mg nightly to cover potential anaerobes.  This was reviewed with pharmacy.  Discharge today and follow-up in the office on Friday with Autumn to change the dressing.

## 2021-02-06 NOTE — Progress Notes (Signed)
Spoke to [place], pt's home HD RN. Michelle aware of pt's d/c today. D/C summary faxed to clinic per Michelle's request for continuation of care.   Melven Sartorius Renal Navigator [phone]

## 2021-02-06 NOTE — Discharge Summary (Signed)
Discharge Diagnoses:  Active Problems:   History of partial ray amputation of fourth toe of right foot ([place])   Cutaneous abscess of left foot   Acute osteomyelitis of metatarsal bone of left foot (HCC)   Abscess of bursa of left foot   Surgeries: Procedure(s): LEFT FOOT 4TH RAY AMPUTATION on 02/02/2021    Consultants: Treatment Team:  Donato Heinz, MD Roney Jaffe, MD  Discharged Condition: Improved  Hospital Course: [name] is an 70 y.o. male who was admitted 02/02/2021 with a chief complaint of osteomyelitis left fourth metatarsal, with a final diagnosis of Osteomyelitis and Cellulitis Left Foot.  Patient was brought to the operating room on 02/02/2021 and underwent Procedure(s): LEFT FOOT 4TH RAY AMPUTATION.    Patient was given perioperative antibiotics:  Anti-infectives (From admission, onward)    Start     Dose/Rate Route Frequency Ordered Stop   02/06/21 0000  amoxicillin-clavulanate (AUGMENTIN) 500-125 MG tablet        1 tablet Oral Daily 02/06/21 0708     02/05/21 2000  ampicillin (OMNIPEN) 2 g in sodium chloride 0.9 % 100 mL IVPB        2 g 300 mL/hr over 20 Minutes Intravenous Every 24 hours 02/05/21 1344     02/03/21 2000  vancomycin (VANCOCIN) IVPB 1000 mg/200 mL premix        1,000 mg 200 mL/hr over 60 Minutes Intravenous  Once 02/03/21 1632 02/03/21 2310   02/03/21 1145  vancomycin (VANCOREADY) IVPB 1750 mg/350 mL        1,750 mg 175 mL/hr over 120 Minutes Intravenous  Once 02/03/21 1053 02/03/21 1503   02/03/21 1053  vancomycin variable dose per unstable renal function (pharmacist dosing)  Status:  Discontinued         Does not apply See admin instructions 02/03/21 1053 02/05/21 1344   02/03/21 0945  ceFEPIme (MAXIPIME) 1 g in sodium chloride 0.9 % 100 mL IVPB  Status:  Discontinued        1 g 200 mL/hr over 30 Minutes Intravenous Every 24 hours 02/03/21 0847 02/05/21 1344   02/03/21 0000  ceFAZolin (ANCEF) IVPB 2g/100 mL premix  Status:   Discontinued        2 g 200 mL/hr over 30 Minutes Intravenous Every 8 hours 02/02/21 2003 02/03/21 0810     .  Patient was given sequential compression devices, early ambulation, and aspirin for DVT prophylaxis.  Recent vital signs: Patient Vitals for the past 24 hrs:  BP Temp Temp src Pulse Resp SpO2  02/05/21 2235 (!) 168/52 -- -- -- -- --  02/05/21 2216 (!) 165/52 -- -- 70 -- --  02/05/21 1925 (!) 176/61 98.1 F (36.7 C) Oral 79 18 100 %  02/05/21 1645 (!) 169/62 -- -- 72 -- 100 %  .  Recent laboratory studies: No results found.  Discharge Medications:   Allergies as of 02/06/2021       Reactions   Mushroom Extract Complex Nausea Only        Medication List     STOP taking these medications    doxycycline 100 MG capsule Commonly known as: VIBRAMYCIN       TAKE these medications    acetaminophen 500 MG tablet Commonly known as: TYLENOL Take 1,000-2,000 mg by mouth 2 (two) times daily as needed for moderate pain.   allopurinol 100 MG tablet Commonly known as: ZYLOPRIM Take 200 mg by mouth daily.   amoxicillin-clavulanate 500-125 MG tablet Commonly known as: Augmentin  Take 1 tablet (500 mg total) by mouth daily. Take daily in the evening.   calcitRIOL 0.5 MCG capsule Commonly known as: ROCALTROL Take 0.5 mcg by mouth daily.   carvedilol 12.5 MG tablet Commonly known as: COREG Take 1 tablet (12.5 mg total) by mouth in the morning and at bedtime.   DIALYVITE 800 WITH ZINC 0.8 MG Tabs Take 1 tablet by mouth daily.   famotidine 20 MG tablet Commonly known as: PEPCID TAKE 1 TABLET BY MOUTH AFTER SUPPER   levothyroxine 112 MCG tablet Commonly known as: SYNTHROID Take 112 mcg by mouth daily before breakfast. BRAND NAME SYNTHROID   lidocaine-prilocaine cream Commonly known as: EMLA Apply 1 application topically 3 (three) times a week.   losartan 100 MG tablet Commonly known as: COZAAR Take 100 mg by mouth daily.   Lubricant Eye Drops 0.4-0.3 %  Soln Generic drug: Polyethyl Glycol-Propyl Glycol Place 1-2 drops into both eyes 3 (three) times daily as needed (dry/irritated eyes.).   OneTouch Delica Plus [id number] Misc   oxyCODONE-acetaminophen 5-325 MG tablet Commonly known as: PERCOCET/ROXICET Take 1 tablet by mouth every 4 (four) hours as needed.   Ozempic (0.25 or 0.5 MG/DOSE) 2 MG/1.5ML Sopn Generic drug: Semaglutide(0.25 or 0.5MG /DOS) Inject 0.5 mg into the skin once a week.   pantoprazole 40 MG tablet Commonly known as: PROTONIX TAKE 1 TABLET BY MOUTH EVERY DAY   pregabalin 150 MG capsule Commonly known as: LYRICA Take 1 capsule (150 mg total) by mouth in the morning and at bedtime. What changed:  how much to take when to take this additional instructions   prochlorperazine 5 MG tablet Commonly known as: COMPAZINE TAKE 1 TABLET BY MOUTH TWICE A DAY   rosuvastatin 10 MG tablet Commonly known as: CRESTOR Take 10 mg by mouth daily.   sevelamer carbonate 800 MG tablet Commonly known as: RENVELA Take 800 mg by mouth 3 (three) times daily.               Durable Medical Equipment  (From admission, onward)           Start     Ordered   02/05/21 1030  For home use only DME lightweight manual wheelchair with seat cushion  Once       Comments: Patient suffers from partial amputation of toe which impairs their ability to perform daily activities like ambulating in the home and community.  A cane will not resolve  issue with performing activities of daily living. A wheelchair will allow patient to safely perform daily activities. Patient is not able to propel themselves in the home using a standard weight wheelchair due to generalized weakness. Patient can self propel in the lightweight wheelchair. Length of need  6 months. Accessories: elevating leg rests (ELRs), wheel locks, extensions and anti-tippers.   02/05/21 1031   02/05/21 1029  For home use only DME Walker rolling  Once       Comments: S/p partial  amputation of toes  Question Answer Comment  Walker: With [employer] Wheels   Patient needs a walker to treat with the following condition Generalized weakness      02/05/21 1031              Discharge Care Instructions  (From admission, onward)           Start     Ordered   02/06/21 0000  Touch down weight bearing       Question Answer Comment  Laterality left  Extremity Lower      02/06/21 0708            Diagnostic Studies: No results found.  Patient benefited maximally from their hospital stay and there were no complications.     Disposition: Discharge disposition: 01-Home or Self Care      Discharge Instructions     Call MD / Call 911   Complete by: As directed    If you experience chest pain or shortness of breath, CALL 911 and be transported to the hospital emergency room.  If you develope a fever above 101 F, pus (white drainage) or increased drainage or redness at the wound, or calf pain, call your surgeon's office.   Constipation Prevention   Complete by: As directed    Drink plenty of fluids.  Prune juice may be helpful.  You may use a stool softener, such as Colace (over the counter) 100 mg twice a day.  Use MiraLax (over the counter) for constipation as needed.   Diet - low sodium heart healthy   Complete by: As directed    Increase activity slowly as tolerated   Complete by: As directed    Negative Pressure Wound Therapy - Incisional   Complete by: As directed    Post-operative opioid taper instructions:   Complete by: As directed    POST-OPERATIVE OPIOID TAPER INSTRUCTIONS: It is important to wean off of your opioid medication as soon as possible. If you do not need pain medication after your surgery it is ok to stop day one. Opioids include: Codeine, Hydrocodone(Norco, Vicodin), Oxycodone(Percocet, oxycontin) and hydromorphone amongst others.  Long term and even short term use of opiods can cause: Increased pain  response Dependence Constipation Depression Respiratory depression And more.  Withdrawal symptoms can include Flu like symptoms Nausea, vomiting And more Techniques to manage these symptoms Hydrate well Eat regular healthy meals Stay active Use relaxation techniques(deep breathing, meditating, yoga) Do Not substitute Alcohol to help with tapering If you have been on opioids for less than two weeks and do not have pain than it is ok to stop all together.  Plan to wean off of opioids This plan should start within one week post op of your joint replacement. Maintain the same interval or time between taking each dose and first decrease the dose.  Cut the total daily intake of opioids by one tablet each day Next start to increase the time between doses. The last dose that should be eliminated is the evening dose.      Touch down weight bearing   Complete by: As directed    Laterality: left   Extremity: Lower       Follow-up Information     [name], MD Follow up in 3 day(s).   Specialty: Orthopedic Surgery Why: Call for follow-up appointment with Autumn on Friday to change the dressing. Contact information: [address] [place] [address] [phone]                  Signed: [name] 02/06/2021, 7:09 AM

## 2021-02-07 ENCOUNTER — Telehealth: Payer: Self-pay | Admitting: Orthopedic Surgery

## 2021-02-07 ENCOUNTER — Encounter: Payer: Medicare Other | Admitting: Rehabilitative and Restorative Service Providers"

## 2021-02-07 NOTE — Telephone Encounter (Signed)
Pt called and he just got home from surgery yesterday and duda told him to make an appt with [facility] Friday. Can you open up a spot?   CB [phone]

## 2021-02-07 NOTE — Telephone Encounter (Signed)
I called pt and made an appt for Friday at 10 am

## 2021-02-07 NOTE — Telephone Encounter (Signed)
Patient called. He says he needs an afternoon time. Would like Autumn to call him back. His call back number is [phone]

## 2021-02-07 NOTE — Telephone Encounter (Signed)
Patient has decided that he wants to keep his appt this Friday in the morning.

## 2021-02-09 ENCOUNTER — Ambulatory Visit (INDEPENDENT_AMBULATORY_CARE_PROVIDER_SITE_OTHER): Payer: Medicare Other | Admitting: Family

## 2021-02-09 ENCOUNTER — Encounter: Payer: Medicare Other | Admitting: Rehabilitative and Restorative Service Providers"

## 2021-02-09 ENCOUNTER — Ambulatory Visit: Payer: Medicare Other | Admitting: Family

## 2021-02-09 DIAGNOSIS — Z89432 Acquired absence of left foot: Secondary | ICD-10-CM

## 2021-02-14 ENCOUNTER — Encounter: Payer: Self-pay | Admitting: Family

## 2021-02-14 ENCOUNTER — Encounter: Payer: Medicare Other | Admitting: Rehabilitative and Restorative Service Providers"

## 2021-02-14 NOTE — Progress Notes (Signed)
Post-Op Visit Note   Patient: [name]           Date of Birth: 1950/05/07           MRN: [id number] Visit Date: 02/09/2021 PCP: Lorenda Hatchet, FNP  Chief Complaint:  Chief Complaint  Patient presents with   Left Foot - Routine Post Op    02/02/21 left foot 4th ray amputation     HPI:  HPI The patient is a 70 year old gentleman seen status post left fourth ray amputation on October 28.  Ortho Exam The incision is well approximated sutures there is no gaping no surrounding erythema or drainage  Visit Diagnoses: No diagnosis found.  Plan: Begin daily Dial soap cleansing.  Dry dressing changes.  Touchdown weightbearing through the heel for transfers only follow-up in 1 week  Follow-Up Instructions: No follow-ups on file.   Imaging: No results found.  Orders:  No orders of the defined types were placed in this encounter.  No orders of the defined types were placed in this encounter.    PMFS History: Patient Active Problem List   Diagnosis Date Noted   Abscess of bursa of left foot 02/03/2021   Cutaneous abscess of left foot    Acute osteomyelitis of metatarsal bone of left foot (HCC)    Symptomatic anemia 09/13/2020   COVID-19 virus infection 09/13/2020   Pressure injury of skin 06/29/2020   Gross hematuria 06/16/2020   Typical atrial flutter ([place]) 03/24/2020   Bilateral pleural effusion 02/24/2020   Healthcare maintenance 01/28/2020   Shortness of breath 01/28/2020   History of partial ray amputation of fourth toe of right foot ([place]) 09/08/2019   Ulcerated, foot, right, with necrosis of bone (HCC)    Chronic cough 06/25/2019   Cutaneous abscess of right foot    Subacute osteomyelitis of right foot ([place]) 12/14/2018   AKI (acute kidney injury) ([place]) 12/14/2018   ESRD (end stage renal disease) ([place]) 12/14/2018   Gait abnormality 08/24/2018   Diabetic neuropathy ([place]) 02/06/2018   Cervical myelopathy ([place]) 02/06/2018   Onychomycosis 10/30/2017    Other spondylosis with radiculopathy, cervical region 01/27/2017   Midfoot ulcer, right, limited to breakdown of skin ([place]) 11/15/2016   Lateral epicondylitis, left elbow 08/12/2016   Cellulitis of fifth toe of right foot 07/29/2016   Prostate cancer ([place]) 06/19/2016   Non-pressure chronic ulcer of other part of right foot limited to breakdown of skin ([place]) 04/04/2016   Cellulitis of leg, right 12/24/2015   Cellulitis of right lower extremity 12/24/2015   Gout 09/14/2012   HTN (hypertension) 09/14/2012   Hypothyroidism 09/14/2012   CAD (coronary artery disease) 09/14/2012   Diabetes mellitus ([place]) 09/14/2012   Hypercholesteremia    Neuropathy ([place])    Past Medical History:  Diagnosis Date   Ambulates with cane    straight cane   Cervical myelopathy ([place]) 02/06/2018   Chronic kidney disease    dailysis M W F- home   Complication of anesthesia    Coronary artery disease    Diabetes mellitus without complication ([place])    type2   Diabetic foot ulcer ([place])    Diabetic neuropathy ([place]) 02/06/2018   Gait abnormality 08/24/2018   GERD (gastroesophageal reflux disease)     01/06/20- not current   Gout    History of blood transfusion    History of kidney stones    passed stones   Hypercholesteremia    Hypertension    Hypothyroidism    Neuromuscular disorder ([place])  neuropathy left leg and bilateral feet   Neuropathy    PONV (postoperative nausea and vomiting)    Prostate cancer ([place])    PVD (peripheral vascular disease) ([place])    with amputations    Family History  Problem Relation Age of Onset   Diabetes Mellitus II Mother    Kidney disease Mother    Diabetes Mellitus II Father    CAD Father    Cancer Father        prostate   Kidney disease Father    Diabetes Mellitus II Brother    Kidney disease Brother    Diabetes Mellitus II Brother    Stomach cancer Brother 44   Kidney disease Brother    Colon cancer Neg Hx    Colon polyps Neg Hx    Esophageal cancer Neg Hx     Rectal cancer Neg Hx    Pancreatic cancer Neg Hx     Past Surgical History:  Procedure Laterality Date   A-FLUTTER ABLATION N/A 04/06/2020   Procedure: A-FLUTTER ABLATION;  Surgeon: [name], MD;  Location: [facility];  Service: Cardiovascular;  Laterality: N/A;   AMPUTATION Left 12/25/2013   Procedure: AMPUTATION RAY LEFT 5TH RAY;  Surgeon: [name], MD;  Location: [facility];  Service: Orthopedics;  Laterality: Left;   AMPUTATION Right 12/15/2018   Procedure: AMPUTATION OF 4TH AND 5TH TOES RIGHT FOOT;  Surgeon: [name], MD;  Location: [facility];  Service: Orthopedics;  Laterality: Right;   AMPUTATION Left 02/02/2021   Procedure: LEFT FOOT 4TH RAY AMPUTATION;  Surgeon: [name], MD;  Location: [facility];  Service: Orthopedics;  Laterality: Left;   APPLICATION OF WOUND VAC Right 12/15/2018   Procedure: APPLICATION OF WOUND VAC;  Surgeon: [name], MD;  Location: [facility];  Service: Orthopedics;  Laterality: Right;   AV FISTULA PLACEMENT Left 03/25/2019   Procedure: LEFT ARM ARTERIOVENOUS (AV) FISTULA CREATION;  Surgeon: [name], MD;  Location: [facility];  Service: Vascular;  Laterality: Left;   [place] Left 05/20/2019   Procedure: SECOND STAGE LEFT BASCILIC VEIN TRANSPOSITION;  Surgeon: [name], MD;  Location: [facility];  Service: Vascular;  Laterality: Left;   CARDIAC CATHETERIZATION  02/17/2014   CHOLECYSTECTOMY     COLONOSCOPY  2011   in [place], [place]  2008   CYSTOSCOPY N/A 06/29/2020   Procedure: [place];  Surgeon: [name], MD;  Location: [facility];  Service: Urology;  Laterality: N/A;   CYSTOSCOPY WITH FULGERATION Bilateral 06/17/2020   Procedure: CYSTOSCOPY,BILATERAL RETROGRADE, CLOT EVACUATION WITH FULGERATION OF THE BLADDER;  Surgeon: Janith Lima, MD;  Location: [facility];  Service: Urology;  Laterality: Bilateral;   CYSTOSCOPY WITH  FULGERATION N/A 06/28/2020   Procedure: CYSTOSCOPY WITH CLOT EVACUATION AND FULGERATION OF BLEEDERS;  Surgeon: [name], MD;  Location: [facility];  Service: Urology;  Laterality: N/A;  1 HR   I & D EXTREMITY Right 12/15/2018   Procedure: DEBRIDEMENT RIGHT FOOT;  Surgeon: [name], MD;  Location: [facility];  Service: Orthopedics;  Laterality: Right;   I & D EXTREMITY Right 08/27/2019   Procedure: PARTIAL CUBOID EXCISION RIGHT FOOT;  Surgeon: [name], MD;  Location: [facility];  Service: Orthopedics;  Laterality: Right;   I & D EXTREMITY Right 01/07/2020   Procedure: RIGHT FOOT EXCISION INFECTED BONE;  Surgeon:  [name], MD;  Location: [facility];  Service: Orthopedics;  Laterality: Right;   NECK SURGERY     novemver 2019   TRANSURETHRAL RESECTION OF PROSTATE N/A 06/28/2020   Procedure: TRANSURETHRAL RESECTION OF THE PROSTATE (TURP);  Surgeon: [name], MD;  Location: [facility];  Service: Urology;  Laterality: N/A;   WISDOM TOOTH EXTRACTION     Social History   Occupational History   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  Tobacco Use   Smoking status: Former    Types: Cigars    Start date: 04/08/1973    Quit date: 12/24/1988    Years since quitting: 32.1   Smokeless tobacco: Never   Tobacco comments:    Cigars and Pipe   Vaping Use   Vaping Use: Never used  Substance and Sexual Activity   Alcohol use: Never   Drug use: Never   Sexual activity: Yes    Partners: Female

## 2021-02-16 ENCOUNTER — Encounter: Payer: Medicare Other | Admitting: Rehabilitative and Restorative Service Providers"

## 2021-02-16 ENCOUNTER — Ambulatory Visit (INDEPENDENT_AMBULATORY_CARE_PROVIDER_SITE_OTHER): Payer: Medicare Other | Admitting: Family

## 2021-02-16 ENCOUNTER — Encounter: Payer: Self-pay | Admitting: Family

## 2021-02-16 DIAGNOSIS — Z89432 Acquired absence of left foot: Secondary | ICD-10-CM

## 2021-02-16 MED ORDER — SILVER SULFADIAZINE 1 % EX CREA: 1.0000 "application " | TOPICAL_CREAM | Freq: Every day | CUTANEOUS | 0 refills | Status: DC

## 2021-02-16 MED ORDER — AMOXICILLIN-POT CLAVULANATE 500-125 MG PO TABS: 1.0000 | ORAL_TABLET | Freq: Every day | ORAL | 0 refills | Status: DC

## 2021-02-16 NOTE — Progress Notes (Signed)
Post-Op Visit Note   Patient: [name]           Date of Birth: August 01, 1950           MRN: [id number] Visit Date: 02/16/2021 PCP: Lorenda Hatchet, FNP  Chief Complaint:  Chief Complaint  Patient presents with   Left Foot - Routine Post Op    4th ray amputation 02/02/21    HPI:  HPI The patient is a 70 year old gentleman seen status post fourth ray amputation of the left foot October 28.  He has been doing the daily Dial soap cleansing dry dressing changes only weightbearing through his heel. Ortho Exam On examination of the left foot.  There is very minimal edema no erythema. the surgical incision is well-healed proximally.  Sutures are in place.  The distal end of the incision does have a satellite lesion there is an ischemic ulcer over the dorsum of his foot adjacent to the incision this is the size of a half dollar there is no drainage no surrounding maceration granulation circumferentially this is covered with eschar  Visit Diagnoses:  1. Partial nontraumatic amputation of foot, left (HCC)     Plan: He will begin using a nitroglycerin patch.  Continue current wound cleansing.  Did give Prisma which they will apply to the ulcerative area daily  Follow-Up Instructions: Return in about 1 week (around 02/23/2021).   Imaging: No results found.  Orders:  No orders of the defined types were placed in this encounter.  Meds ordered this encounter  Medications   amoxicillin-clavulanate (AUGMENTIN) 500-125 MG tablet    Sig: Take 1 tablet (500 mg total) by mouth daily. Take daily in the evening.    Dispense:  74 tablet    Refill:  0   silver sulfADIAZINE (SILVADENE) 1 % cream    Sig: Apply 1 application topically daily.    Dispense:  50 g    Refill:  0     PMFS History: Patient Active Problem List   Diagnosis Date Noted   Abscess of bursa of left foot 02/03/2021   Cutaneous abscess of left foot    Acute osteomyelitis of metatarsal bone of left foot (HCC)     Symptomatic anemia 09/13/2020   COVID-19 virus infection 09/13/2020   Pressure injury of skin 06/29/2020   Gross hematuria 06/16/2020   Typical atrial flutter ([place]) 03/24/2020   Bilateral pleural effusion 02/24/2020   Healthcare maintenance 01/28/2020   Shortness of breath 01/28/2020   History of partial ray amputation of fourth toe of right foot ([place]) 09/08/2019   Ulcerated, foot, right, with necrosis of bone (HCC)    Chronic cough 06/25/2019   Cutaneous abscess of right foot    Subacute osteomyelitis of right foot ([place]) 12/14/2018   AKI (acute kidney injury) ([place]) 12/14/2018   ESRD (end stage renal disease) ([place]) 12/14/2018   Gait abnormality 08/24/2018   Diabetic neuropathy ([place]) 02/06/2018   Cervical myelopathy ([place]) 02/06/2018   Onychomycosis 10/30/2017   Other spondylosis with radiculopathy, cervical region 01/27/2017   Midfoot ulcer, right, limited to breakdown of skin ([place]) 11/15/2016   Lateral epicondylitis, left elbow 08/12/2016   Cellulitis of fifth toe of right foot 07/29/2016   Prostate cancer ([place]) 06/19/2016   Non-pressure chronic ulcer of other part of right foot limited to breakdown of skin ([place]) 04/04/2016   Cellulitis of leg, right 12/24/2015   Cellulitis of right lower extremity 12/24/2015   Gout 09/14/2012   HTN (hypertension) 09/14/2012  Hypothyroidism 09/14/2012   CAD (coronary artery disease) 09/14/2012   Diabetes mellitus ([place]) 09/14/2012   Hypercholesteremia    Neuropathy ([place])    Past Medical History:  Diagnosis Date   Ambulates with cane    straight cane   Cervical myelopathy ([place]) 02/06/2018   Chronic kidney disease    dailysis M W F- home   Complication of anesthesia    Coronary artery disease    Diabetes mellitus without complication ([place])    type2   Diabetic foot ulcer ([place])    Diabetic neuropathy ([place]) 02/06/2018   Gait abnormality 08/24/2018   GERD (gastroesophageal reflux disease)     01/06/20- not current   Gout    History of  blood transfusion    History of kidney stones    passed stones   Hypercholesteremia    Hypertension    Hypothyroidism    Neuromuscular disorder (HCC)    neuropathy left leg and bilateral feet   Neuropathy    PONV (postoperative nausea and vomiting)    Prostate cancer (HCC)    PVD (peripheral vascular disease) ([place])    with amputations    Family History  Problem Relation Age of Onset   Diabetes Mellitus II Mother    Kidney disease Mother    Diabetes Mellitus II Father    CAD Father    Cancer Father        prostate   Kidney disease Father    Diabetes Mellitus II Brother    Kidney disease Brother    Diabetes Mellitus II Brother    Stomach cancer Brother 30   Kidney disease Brother    Colon cancer Neg Hx    Colon polyps Neg Hx    Esophageal cancer Neg Hx    Rectal cancer Neg Hx    Pancreatic cancer Neg Hx     Past Surgical History:  Procedure Laterality Date   A-FLUTTER ABLATION N/A 04/06/2020   Procedure: A-FLUTTER ABLATION;  Surgeon: [name], MD;  Location: [facility];  Service: Cardiovascular;  Laterality: N/A;   AMPUTATION Left 12/25/2013   Procedure: AMPUTATION RAY LEFT 5TH RAY;  Surgeon: [name], MD;  [facility];  Service: Orthopedics;  Laterality: Left;   AMPUTATION Right 12/15/2018   Procedure: AMPUTATION OF 4TH AND 5TH TOES RIGHT FOOT;  Surgeon: [name], MD;  Location: [facility];  Service: Orthopedics;  Laterality: Right;   AMPUTATION Left 02/02/2021   Procedure: LEFT FOOT 4TH RAY AMPUTATION;  Surgeon: [name], MD;  Location: [facility];  Service: Orthopedics;  Laterality: Left;   APPLICATION OF WOUND VAC Right 12/15/2018   Procedure: APPLICATION OF WOUND VAC;  Surgeon: [name], MD;  Location: [facility];  Service: Orthopedics;  Laterality: Right;   AV FISTULA PLACEMENT Left 03/25/2019   Procedure: LEFT ARM ARTERIOVENOUS (AV) FISTULA CREATION;  Surgeon: [name], MD;  Location: [facility];  Service: Vascular;  Laterality: Left;    [place] Left 05/20/2019   Procedure: SECOND STAGE LEFT BASCILIC VEIN TRANSPOSITION;  Surgeon: [name], MD;  Location: [facility];  Service: Vascular;  Laterality: Left;   CARDIAC CATHETERIZATION  02/17/2014   CHOLECYSTECTOMY     COLONOSCOPY  2011   in [place], [place]  2008   CYSTOSCOPY N/A 06/29/2020   Procedure: [place];  Surgeon: [name], MD;  Location: [facility];  Service: Urology;  Laterality: N/A;   CYSTOSCOPY WITH FULGERATION Bilateral 06/17/2020   Procedure: CYSTOSCOPY,BILATERAL RETROGRADE, CLOT EVACUATION WITH FULGERATION OF THE BLADDER;  Surgeon: Janith Lima, MD;  Location: [facility];  Service: Urology;  Laterality: Bilateral;   CYSTOSCOPY WITH FULGERATION N/A 06/28/2020   Procedure: CYSTOSCOPY WITH CLOT EVACUATION AND FULGERATION OF BLEEDERS;  Surgeon: [name], MD;  Location: [facility];  Service: Urology;  Laterality: N/A;  1 HR   I & D EXTREMITY Right 12/15/2018   Procedure: DEBRIDEMENT RIGHT FOOT;  Surgeon: [name], MD;  Location: [facility];  Service: Orthopedics;  Laterality: Right;   I & D EXTREMITY Right 08/27/2019   Procedure: PARTIAL CUBOID EXCISION RIGHT FOOT;  Surgeon: [name], MD;  Location: [facility];  Service: Orthopedics;  Laterality: Right;   I & D EXTREMITY Right 01/07/2020   Procedure: RIGHT FOOT EXCISION INFECTED BONE;  Surgeon: [name], MD;  Location: [facility];  Service: Orthopedics;  Laterality: Right;   NECK SURGERY     novemver 2019   TRANSURETHRAL RESECTION OF PROSTATE N/A 06/28/2020   Procedure: TRANSURETHRAL RESECTION OF THE PROSTATE (TURP);  Surgeon: [name], MD;  Location: [facility];  Service: Urology;  Laterality: N/A;   WISDOM TOOTH EXTRACTION     Social History   Occupational History   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

## 2021-02-20 ENCOUNTER — Encounter: Payer: Medicare Other | Admitting: Rehabilitative and Restorative Service Providers"

## 2021-02-20 DIAGNOSIS — Z89432 Acquired absence of left foot: Secondary | ICD-10-CM

## 2021-02-20 HISTORY — DX: Acquired absence of left foot: Z89.432

## 2021-02-22 ENCOUNTER — Encounter: Payer: Medicare Other | Admitting: Rehabilitative and Restorative Service Providers"

## 2021-02-23 ENCOUNTER — Telehealth: Payer: Self-pay | Admitting: Family

## 2021-02-23 ENCOUNTER — Other Ambulatory Visit: Payer: Self-pay

## 2021-02-23 ENCOUNTER — Ambulatory Visit (INDEPENDENT_AMBULATORY_CARE_PROVIDER_SITE_OTHER): Payer: Medicare Other | Admitting: Family

## 2021-02-23 ENCOUNTER — Encounter: Payer: Medicare Other | Admitting: Rehabilitative and Restorative Service Providers"

## 2021-02-23 DIAGNOSIS — Z89432 Acquired absence of left foot: Secondary | ICD-10-CM

## 2021-02-23 MED ORDER — NITROGLYCERIN 0.2 MG/HR TD PT24: 0.2000 mg | MEDICATED_PATCH | Freq: Every day | TRANSDERMAL | 3 refills | Status: DC

## 2021-02-23 MED ORDER — SILVER SULFADIAZINE 1 % EX CREA: 1.0000 "application " | TOPICAL_CREAM | Freq: Every day | CUTANEOUS | 0 refills | Status: DC

## 2021-02-23 NOTE — Telephone Encounter (Signed)
I opened a t time at 4 pm on the 28th and I do not have anything on the 2nd if you could please schedule him on the 1st and there are times open that day. Thanks!

## 2021-02-23 NOTE — Telephone Encounter (Signed)
Pt saw [name] today and was told to come back on the 28th (after 1pm if possible) and again on the 2nd (anytime that morning). Could not open any spots on my end, needing [facility] help to place pt. Will call back at [phone] when spots are opened up.

## 2021-02-23 NOTE — Telephone Encounter (Signed)
Called pt to inform of appt spaces [facility] opened up for follow up appts with [name]. First appt made for 03/05/21 at 4 pm and [name] or Junie Panning will not be avalible that Friday the 2nd but wanted a follow up sch'd for Thursday the 1st.

## 2021-02-23 NOTE — Progress Notes (Signed)
Post-Op Visit Note   Patient: [name]           Date of Birth: 15-Mar-1951           MRN: [id number] Visit Date: 02/23/2021 PCP: Lorenda Hatchet, FNP  Chief Complaint:  Chief Complaint  Patient presents with   Left Foot - Routine Post Op    4th ray amputation 02/02/21    HPI:  HPI The patient is a 70 year old gentleman seen status post fourth ray amputation of the left foot October 28.  He has been doing the daily Dial soap cleansing dry dressing changes. only weightbearing through his heel.  Ortho Exam On examination of the left foot, there is no edema no erythema. the surgical incision is well-healed proximally.  Few distal sutures are in place.  The distal end of the incision does have a satellite ischemic ulcer, dorsally. this is covered with eschar.  There is no drainage no foul odor no surrounding erythema  Palpable dorsalis pedis pulse  Visit Diagnoses:  No diagnosis found.   Plan: Daily Dial soap cleansing.  He may continue using Prisma for dressing changes or silver cell.  Nitroglycerin patches.  Follow-up in the office in 10 days.  Minimize weightbearing.  Discussed possibility of referring to vascular.  He has been in the past with no recommended interventions.  Follow-Up Instructions: No follow-ups on file.   Imaging: No results found.  Orders:  No orders of the defined types were placed in this encounter.  Meds ordered this encounter  Medications   nitroGLYCERIN (NITRODUR - DOSED IN MG/24 HR) 0.2 mg/hr patch    Sig: Place 1 patch (0.2 mg total) onto the skin daily.    Dispense:  30 patch    Refill:  3    Apply patch near the effected area, change location daily   silver sulfADIAZINE (SILVADENE) 1 % cream    Sig: Apply 1 application topically daily.    Dispense:  400 g    Refill:  0     PMFS History: Patient Active Problem List   Diagnosis Date Noted   Partial nontraumatic amputation of foot, left ([place]) 02/20/2021   Abscess of bursa of  left foot 02/03/2021   Cutaneous abscess of left foot    Acute osteomyelitis of metatarsal bone of left foot (HCC)    Symptomatic anemia 09/13/2020   COVID-19 virus infection 09/13/2020   Pressure injury of skin 06/29/2020   Gross hematuria 06/16/2020   Typical atrial flutter ([place]) 03/24/2020   Bilateral pleural effusion 02/24/2020   Healthcare maintenance 01/28/2020   Shortness of breath 01/28/2020   History of partial ray amputation of fourth toe of right foot ([place]) 09/08/2019   Ulcerated, foot, right, with necrosis of bone (HCC)    Chronic cough 06/25/2019   Cutaneous abscess of right foot    Subacute osteomyelitis of right foot ([place]) 12/14/2018   AKI (acute kidney injury) ([place]) 12/14/2018   ESRD (end stage renal disease) ([place]) 12/14/2018   Gait abnormality 08/24/2018   Diabetic neuropathy ([place]) 02/06/2018   Cervical myelopathy ([place]) 02/06/2018   Onychomycosis 10/30/2017   Other spondylosis with radiculopathy, cervical region 01/27/2017   Midfoot ulcer, right, limited to breakdown of skin ([place]) 11/15/2016   Lateral epicondylitis, left elbow 08/12/2016   Cellulitis of fifth toe of right foot 07/29/2016   Prostate cancer ([place]) 06/19/2016   Non-pressure chronic ulcer of other part of right foot limited to breakdown of skin ([place]) 04/04/2016   Cellulitis  of leg, right 12/24/2015   Cellulitis of right lower extremity 12/24/2015   Gout 09/14/2012   HTN (hypertension) 09/14/2012   Hypothyroidism 09/14/2012   CAD (coronary artery disease) 09/14/2012   Diabetes mellitus ([place]) 09/14/2012   Hypercholesteremia    Neuropathy ([place])    Past Medical History:  Diagnosis Date   Ambulates with cane    straight cane   Cervical myelopathy ([place]) 02/06/2018   Chronic kidney disease    dailysis M W F- home   Complication of anesthesia    Coronary artery disease    Diabetes mellitus without complication ([place])    type2   Diabetic foot ulcer ([place])    Diabetic neuropathy ([place]) 02/06/2018    Gait abnormality 08/24/2018   GERD (gastroesophageal reflux disease)     01/06/20- not current   Gout    History of blood transfusion    History of kidney stones    passed stones   Hypercholesteremia    Hypertension    Hypothyroidism    Neuromuscular disorder (HCC)    neuropathy left leg and bilateral feet   Neuropathy    PONV (postoperative nausea and vomiting)    Prostate cancer (HCC)    PVD (peripheral vascular disease) ([place])    with amputations    Family History  Problem Relation Age of Onset   Diabetes Mellitus II Mother    Kidney disease Mother    Diabetes Mellitus II Father    CAD Father    Cancer Father        prostate   Kidney disease Father    Diabetes Mellitus II Brother    Kidney disease Brother    Diabetes Mellitus II Brother    Stomach cancer Brother 67   Kidney disease Brother    Colon cancer Neg Hx    Colon polyps Neg Hx    Esophageal cancer Neg Hx    Rectal cancer Neg Hx    Pancreatic cancer Neg Hx     Past Surgical History:  Procedure Laterality Date   A-FLUTTER ABLATION N/A 04/06/2020   Procedure: A-FLUTTER ABLATION;  Surgeon: [name], MD;  Location: [facility];  Service: Cardiovascular;  Laterality: N/A;   AMPUTATION Left 12/25/2013   Procedure: AMPUTATION RAY LEFT 5TH RAY;  Surgeon: [name], MD;  [facility];  Service: Orthopedics;  Laterality: Left;   AMPUTATION Right 12/15/2018   Procedure: AMPUTATION OF 4TH AND 5TH TOES RIGHT FOOT;  Surgeon: [name], MD;  Location: [facility];  Service: Orthopedics;  Laterality: Right;   AMPUTATION Left 02/02/2021   Procedure: LEFT FOOT 4TH RAY AMPUTATION;  Surgeon: [name], MD;  Location: [facility];  Service: Orthopedics;  Laterality: Left;   APPLICATION OF WOUND VAC Right 12/15/2018   Procedure: APPLICATION OF WOUND VAC;  Surgeon: [name], MD;  Location: [facility];  Service: Orthopedics;  Laterality: Right;   AV FISTULA PLACEMENT Left 03/25/2019   Procedure: LEFT ARM  ARTERIOVENOUS (AV) FISTULA CREATION;  Surgeon: [name], MD;  Location: [facility];  Service: Vascular;  Laterality: Left;   [place] Left 05/20/2019   Procedure: SECOND STAGE LEFT BASCILIC VEIN TRANSPOSITION;  Surgeon: [name], MD;  Location: [facility];  Service: Vascular;  Laterality: Left;   CARDIAC CATHETERIZATION  02/17/2014   CHOLECYSTECTOMY     COLONOSCOPY  2011   in [place], [place]  2008  CYSTOSCOPY N/A 06/29/2020   Procedure: CYSTOSCOPY WITH CLOT EVACUATION AND  FULGERATION;  Surgeon: [name], MD;  Location: [facility];  Service: Urology;  Laterality: N/A;   CYSTOSCOPY WITH FULGERATION Bilateral 06/17/2020   Procedure: CYSTOSCOPY,BILATERAL RETROGRADE, CLOT EVACUATION WITH FULGERATION OF THE BLADDER;  Surgeon: Janith Lima, MD;  Location: [facility];  Service: Urology;  Laterality: Bilateral;   CYSTOSCOPY WITH FULGERATION N/A 06/28/2020   Procedure: CYSTOSCOPY WITH CLOT EVACUATION AND FULGERATION OF BLEEDERS;  Surgeon: [name], MD;  Location: [facility];  Service: Urology;  Laterality: N/A;  1 HR   I & D EXTREMITY Right 12/15/2018   Procedure: DEBRIDEMENT RIGHT FOOT;  Surgeon: [name], MD;  Location: [facility];  Service: Orthopedics;  Laterality: Right;   I & D EXTREMITY Right 08/27/2019   Procedure: PARTIAL CUBOID EXCISION RIGHT FOOT;  Surgeon: [name], MD;  Location: [facility];  Service: Orthopedics;  Laterality: Right;   I & D EXTREMITY Right 01/07/2020   Procedure: RIGHT FOOT EXCISION INFECTED BONE;  Surgeon: [name], MD;  Location: [facility];  Service: Orthopedics;  Laterality: Right;   NECK SURGERY     novemver 2019   TRANSURETHRAL RESECTION OF PROSTATE N/A 06/28/2020   Procedure: TRANSURETHRAL RESECTION OF THE PROSTATE (TURP);  Surgeon: [name], MD;  Location: [facility];  Service: Urology;  Laterality: N/A;   WISDOM TOOTH EXTRACTION     Social History   Occupational History    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  Tobacco Use   Smoking status: Former    Types: Cigars    Start date: 04/08/1973    Quit date: 12/24/1988    Years since quitting: 32.2   Smokeless tobacco: Never   Tobacco comments:    Cigars and Pipe   Vaping Use   Vaping Use: Never used  Substance and Sexual Activity   Alcohol use: Never   Drug use: Never   Sexual activity: Yes    Partners: Female

## 2021-02-28 ENCOUNTER — Encounter: Payer: Medicare Other | Admitting: Rehabilitative and Restorative Service Providers"

## 2021-03-05 ENCOUNTER — Other Ambulatory Visit: Payer: Self-pay

## 2021-03-05 ENCOUNTER — Encounter: Payer: Self-pay | Admitting: Orthopedic Surgery

## 2021-03-05 ENCOUNTER — Ambulatory Visit (INDEPENDENT_AMBULATORY_CARE_PROVIDER_SITE_OTHER): Payer: Medicare Other | Admitting: Orthopedic Surgery

## 2021-03-05 DIAGNOSIS — Z89432 Acquired absence of left foot: Secondary | ICD-10-CM

## 2021-03-05 NOTE — Progress Notes (Signed)
Office Visit Note   Patient: [name]           Date of Birth: Aug 09, 1950           MRN: [id number] Visit Date: 03/05/2021              Requested by: Lorenda Hatchet, FNP [id number] [place],  [place] [address] PCP: Lorenda Hatchet, FNP  Chief Complaint  Patient presents with   Left Foot - Routine Post Op    4th ray amputation      HPI: Patient is 4 weeks status post left foot fourth ray amputation.  He is currently on Augmentin daily he is alternating between Silvadene and silver cell dressing changes he is continuing to use the nitroglycerin patch.  Assessment & Plan: Visit Diagnoses:  1. Partial nontraumatic amputation of foot, left (HCC)     Plan: Continue with current wound care continue the nitroglycerin patch.  Minimize weightbearing.  Follow-Up Instructions: Return in about 2 weeks (around 03/19/2021).   Ortho Exam  Patient is alert, oriented, no adenopathy, well-dressed, normal affect, normal respiratory effort. Examination there is some fibrinous exudative tissue and black eschar this was debrided with a 10 blade knife approximately 90% of the wound bed has healthy granulation tissue there is a very small amount of fibrinous tissue remaining.  Imaging: No results found. No images are attached to the encounter.  Labs: Lab Results  Component Value Date   HGBA1C 6.3 (H) 02/02/2021   HGBA1C 5.4 09/13/2020   HGBA1C 5.7 (H) 06/16/2020   ESRSEDRATE 34 (H) 01/24/2020   ESRSEDRATE 41 (H) 01/10/2020   ESRSEDRATE 55 (H) 12/27/2019   CRP 8.8 (H) 01/24/2020   CRP 35.9 (H) 01/10/2020   CRP 77.9 (H) 12/27/2019   REPTSTATUS 02/05/2021 FINAL 02/02/2021   GRAMSTAIN  02/02/2021    NO WBC SEEN RARE GRAM VARIABLE COCCI RARE GRAM NEGATIVE RODS    CULT  02/02/2021    RARE ENTEROCOCCUS FAECALIS RARE DIPHTHEROIDS(CORYNEBACTERIUM SPECIES) Standardized susceptibility testing for this organism is not available. FEW BACTEROIDES FRAGILIS BETA  LACTAMASE POSITIVE Performed at [facility], [place] [address] [place], [place] [address]    LABORGA ENTEROCOCCUS FAECALIS 02/02/2021     Lab Results  Component Value Date   ALBUMIN 2.6 (L) 02/06/2021   ALBUMIN 2.5 (L) 02/05/2021   ALBUMIN 3.3 (L) 02/02/2021   PREALBUMIN 14.8 (L) 12/14/2018    Lab Results  Component Value Date   MG 2.0 06/28/2020   MG 1.8 06/27/2020   MG 1.9 06/24/2020   No results found for: [facility]  Lab Results  Component Value Date   PREALBUMIN 14.8 (L) 12/14/2018   CBC EXTENDED Latest Ref Rng & Units 02/06/2021 02/03/2021 02/02/2021  WBC 4.0 - 10.5 K/uL 7.7 12.4(H) 11.2(H)  RBC 4.22 - 5.81 MIL/uL 2.92(L) 3.05(L) 3.69(L)  HGB 13.0 - 17.0 g/dL 8.6(L) 9.1(L) 11.1(L)  HCT 39.0 - 52.0 % 25.6(L) 27.6(L) 32.4(L)  PLT 150 - 400 K/uL 156 205 220  NEUTROABS 1.7 - 7.7 K/uL - - -  LYMPHSABS 0.7 - 4.0 K/uL - - -     There is no height or weight on file to calculate BMI.  Orders:  No orders of the defined types were placed in this encounter.  No orders of the defined types were placed in this encounter.    Procedures: No procedures performed  Clinical Data: No additional findings.  ROS:  All other systems negative, except as noted in  the HPI. Review of Systems  Objective: Vital Signs: There were no vitals taken for this visit.  Specialty Comments:  No specialty comments available.  PMFS History: Patient Active Problem List   Diagnosis Date Noted   Partial nontraumatic amputation of foot, left ([place]) 02/20/2021   Abscess of bursa of left foot 02/03/2021   Cutaneous abscess of left foot    Acute osteomyelitis of metatarsal bone of left foot (HCC)    Symptomatic anemia 09/13/2020   COVID-19 virus infection 09/13/2020   Pressure injury of skin 06/29/2020   Gross hematuria 06/16/2020   Typical atrial flutter ([place]) 03/24/2020   Bilateral pleural effusion 02/24/2020   Healthcare maintenance 01/28/2020   Shortness of breath 01/28/2020    History of partial ray amputation of fourth toe of right foot ([place]) 09/08/2019   Ulcerated, foot, right, with necrosis of bone (HCC)    Chronic cough 06/25/2019   Cutaneous abscess of right foot    Subacute osteomyelitis of right foot ([place]) 12/14/2018   AKI (acute kidney injury) ([place]) 12/14/2018   ESRD (end stage renal disease) ([place]) 12/14/2018   Gait abnormality 08/24/2018   Diabetic neuropathy ([place]) 02/06/2018   Cervical myelopathy ([place]) 02/06/2018   Onychomycosis 10/30/2017   Other spondylosis with radiculopathy, cervical region 01/27/2017   Midfoot ulcer, right, limited to breakdown of skin ([place]) 11/15/2016   Lateral epicondylitis, left elbow 08/12/2016   Cellulitis of fifth toe of right foot 07/29/2016   Prostate cancer ([place]) 06/19/2016   Non-pressure chronic ulcer of other part of right foot limited to breakdown of skin ([place]) 04/04/2016   Cellulitis of leg, right 12/24/2015   Cellulitis of right lower extremity 12/24/2015   Gout 09/14/2012   HTN (hypertension) 09/14/2012   Hypothyroidism 09/14/2012   CAD (coronary artery disease) 09/14/2012   Diabetes mellitus ([place]) 09/14/2012   Hypercholesteremia    Neuropathy ([place])    Past Medical History:  Diagnosis Date   Ambulates with cane    straight cane   Cervical myelopathy ([place]) 02/06/2018   Chronic kidney disease    dailysis M W F- home   Complication of anesthesia    Coronary artery disease    Diabetes mellitus without complication ([place])    type2   Diabetic foot ulcer ([place])    Diabetic neuropathy ([place]) 02/06/2018   Gait abnormality 08/24/2018   GERD (gastroesophageal reflux disease)     01/06/20- not current   Gout    History of blood transfusion    History of kidney stones    passed stones   Hypercholesteremia    Hypertension    Hypothyroidism    Neuromuscular disorder ([place])    neuropathy left leg and bilateral feet   Neuropathy    PONV (postoperative nausea and vomiting)    Prostate cancer ([place])    PVD  (peripheral vascular disease) ([place])    with amputations    Family History  Problem Relation Age of Onset   Diabetes Mellitus II Mother    Kidney disease Mother    Diabetes Mellitus II Father    CAD Father    Cancer Father        prostate   Kidney disease Father    Diabetes Mellitus II Brother    Kidney disease Brother    Diabetes Mellitus II Brother    Stomach cancer Brother 67   Kidney disease Brother    Colon cancer Neg Hx    Colon polyps Neg Hx    Esophageal cancer Neg Hx  Rectal cancer Neg Hx    Pancreatic cancer Neg Hx     Past Surgical History:  Procedure Laterality Date   A-FLUTTER ABLATION N/A 04/06/2020   Procedure: A-FLUTTER ABLATION;  Surgeon: [name], MD;  Location: [facility];  Service: Cardiovascular;  Laterality: N/A;   AMPUTATION Left 12/25/2013   Procedure: AMPUTATION RAY LEFT 5TH RAY;  Surgeon: [name], MD;  [facility];  Service: Orthopedics;  Laterality: Left;   AMPUTATION Right 12/15/2018   Procedure: AMPUTATION OF 4TH AND 5TH TOES RIGHT FOOT;  Surgeon: [name], MD;  Location: [facility];  Service: Orthopedics;  Laterality: Right;   AMPUTATION Left 02/02/2021   Procedure: LEFT FOOT 4TH RAY AMPUTATION;  Surgeon: [name], MD;  Location: [facility];  Service: Orthopedics;  Laterality: Left;   APPLICATION OF WOUND VAC Right 12/15/2018   Procedure: APPLICATION OF WOUND VAC;  Surgeon: [name], MD;  Location: [facility];  Service: Orthopedics;  Laterality: Right;   AV FISTULA PLACEMENT Left 03/25/2019   Procedure: LEFT ARM ARTERIOVENOUS (AV) FISTULA CREATION;  Surgeon: [name], MD;  Location: [facility];  Service: Vascular;  Laterality: Left;   [place] Left 05/20/2019   Procedure: SECOND STAGE LEFT BASCILIC VEIN TRANSPOSITION;  Surgeon: [name], MD;  Location: [facility];  Service: Vascular;  Laterality: Left;   CARDIAC CATHETERIZATION  02/17/2014   CHOLECYSTECTOMY     COLONOSCOPY  2011    in [place], [name]  2008   CYSTOSCOPY N/A 06/29/2020   Procedure: [place];  Surgeon: [name], MD;  Location: [facility];  Service: Urology;  Laterality: N/A;   CYSTOSCOPY WITH FULGERATION Bilateral 06/17/2020   Procedure: CYSTOSCOPY,BILATERAL RETROGRADE, CLOT EVACUATION WITH FULGERATION OF THE BLADDER;  Surgeon: Janith Lima, MD;  Location: [facility];  Service: Urology;  Laterality: Bilateral;   CYSTOSCOPY WITH FULGERATION N/A 06/28/2020   Procedure: CYSTOSCOPY WITH CLOT EVACUATION AND FULGERATION OF BLEEDERS;  Surgeon: [name], MD;  Location: [facility];  Service: Urology;  Laterality: N/A;  1 HR   I & D EXTREMITY Right 12/15/2018   Procedure: DEBRIDEMENT RIGHT FOOT;  Surgeon: [name], MD;  Location: [place];  Service: Orthopedics;  Laterality: Right;   I & D EXTREMITY Right 08/27/2019   Procedure: PARTIAL CUBOID EXCISION RIGHT FOOT;  Surgeon: [name], MD;  Location: [facility];  Service: Orthopedics;  Laterality: Right;   I & D EXTREMITY Right 01/07/2020   Procedure: RIGHT FOOT EXCISION INFECTED BONE;  Surgeon: [name], MD;  Location: [facility];  Service: Orthopedics;  Laterality: Right;   NECK SURGERY     novemver 2019   TRANSURETHRAL RESECTION OF PROSTATE N/A 06/28/2020   Procedure: TRANSURETHRAL RESECTION OF THE PROSTATE (TURP);  Surgeon: [name], MD;  Location: [facility];  Service: Urology;  Laterality: N/A;   WISDOM TOOTH EXTRACTION     Social History   Occupational History   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

## 2021-03-07 ENCOUNTER — Encounter: Payer: Self-pay | Admitting: Family

## 2021-03-08 ENCOUNTER — Ambulatory Visit: Payer: Medicare Other | Admitting: Orthopedic Surgery

## 2021-03-12 ENCOUNTER — Other Ambulatory Visit: Payer: Self-pay

## 2021-03-12 DIAGNOSIS — I739 Peripheral vascular disease, unspecified: Secondary | ICD-10-CM

## 2021-03-13 ENCOUNTER — Other Ambulatory Visit: Payer: Self-pay

## 2021-03-13 ENCOUNTER — Ambulatory Visit (HOSPITAL_COMMUNITY)
Admission: RE | Admit: 2021-03-13 | Discharge: 2021-03-13 | Disposition: A | Discharge status: Home / Self Care | Payer: Medicare Other | Source: Ambulatory Visit | Attending: Vascular Surgery | Admitting: Vascular Surgery

## 2021-03-13 ENCOUNTER — Ambulatory Visit (INDEPENDENT_AMBULATORY_CARE_PROVIDER_SITE_OTHER): Payer: Medicare Other | Admitting: Vascular Surgery

## 2021-03-13 ENCOUNTER — Encounter: Payer: Self-pay | Admitting: Vascular Surgery

## 2021-03-13 VITALS — BP 130/63 | HR 65 | Temp 97.7°F | Resp 16 | Ht 70.0 in | Wt 210.0 lb

## 2021-03-13 DIAGNOSIS — I739 Peripheral vascular disease, unspecified: Secondary | ICD-10-CM

## 2021-03-13 DIAGNOSIS — Z89432 Acquired absence of left foot: Secondary | ICD-10-CM | POA: Diagnosis not present

## 2021-03-13 NOTE — Progress Notes (Signed)
Patient name: [name] MRN: [id number] DOB: March 14, 1951 Sex: male  REASON FOR CONSULT: Evaluate left lower extremity wound with abnormal ABIs  HPI: [name] is a 70 y.o. male, with hx of HTN, HLD, ESRD, DM that presents for evaluation of left lower extremity wound.  Patient had osteomyelitis in the left foot and has been under the care of Dr. [name].  He underwent a left foot fourth ray amputation on 02/02/2021 by Dr. [name].  He is here with his wife and they both feel that the wound is improving and healing.  He denies any previous lower extremity interventions.  He is in a wheelchair today to try and offload pressure on the foot but otherwise states he can walk.  Past Medical History:  Diagnosis Date   Ambulates with cane    straight cane   Cervical myelopathy ([place]) 02/06/2018   Chronic kidney disease    dailysis M W F- home   Complication of anesthesia    Coronary artery disease    Diabetes mellitus without complication ([place])    type2   Diabetic foot ulcer ([place])    Diabetic neuropathy ([place]) 02/06/2018   Gait abnormality 08/24/2018   GERD (gastroesophageal reflux disease)     01/06/20- not current   Gout    History of blood transfusion    History of kidney stones    passed stones   Hypercholesteremia    Hypertension    Hypothyroidism    Neuromuscular disorder (HCC)    neuropathy left leg and bilateral feet   Neuropathy    PONV (postoperative nausea and vomiting)    Prostate cancer (HCC)    PVD (peripheral vascular disease) ([place])    with amputations    Past Surgical History:  Procedure Laterality Date   A-FLUTTER ABLATION N/A 04/06/2020   Procedure: A-FLUTTER ABLATION;  Surgeon: [name], MD;  Location: [facility];  Service: Cardiovascular;  Laterality: N/A;   AMPUTATION Left 12/25/2013   Procedure: AMPUTATION RAY LEFT 5TH RAY;  Surgeon: [name], MD;  [facility];  Service: Orthopedics;  Laterality: Left;   AMPUTATION Right 12/15/2018    Procedure: AMPUTATION OF 4TH AND 5TH TOES RIGHT FOOT;  Surgeon: [name], MD;  Location: [facility];  Service: Orthopedics;  Laterality: Right;   AMPUTATION Left 02/02/2021   Procedure: LEFT FOOT 4TH RAY AMPUTATION;  Surgeon: [name], MD;  Location: [facility];  Service: Orthopedics;  Laterality: Left;   APPLICATION OF WOUND VAC Right 12/15/2018   Procedure: APPLICATION OF WOUND VAC;  Surgeon: [name], MD;  Location: [facility];  Service: Orthopedics;  Laterality: Right;   AV FISTULA PLACEMENT Left 03/25/2019   Procedure: LEFT ARM ARTERIOVENOUS (AV) FISTULA CREATION;  Surgeon: [name], MD;  Location: [facility];  Service: Vascular;  Laterality: Left;   [place] Left 05/20/2019   Procedure: SECOND STAGE LEFT BASCILIC VEIN TRANSPOSITION;  Surgeon: [name], MD;  Location: [facility];  Service: Vascular;  Laterality: Left;   CARDIAC CATHETERIZATION  02/17/2014   CHOLECYSTECTOMY     COLONOSCOPY  2011   in [place], [place]  2008   CYSTOSCOPY N/A 06/29/2020   Procedure: [place];  Surgeon: [name], MD;  Location: [facility];  Service: Urology;  Laterality: N/A;   CYSTOSCOPY WITH FULGERATION Bilateral 06/17/2020   Procedure: CYSTOSCOPY,BILATERAL RETROGRADE,  CLOT EVACUATION WITH FULGERATION OF THE BLADDER;  Surgeon: Janith Lima, MD;  Location: [facility];  Service: Urology;  Laterality: Bilateral;   CYSTOSCOPY WITH FULGERATION N/A 06/28/2020   Procedure: CYSTOSCOPY WITH CLOT EVACUATION AND FULGERATION OF BLEEDERS;  Surgeon: [name], MD;  Location: [facility];  Service: Urology;  Laterality: N/A;  1 HR   I & D EXTREMITY Right 12/15/2018   Procedure: DEBRIDEMENT RIGHT FOOT;  Surgeon: [name], MD;  Location: [place];  Service: Orthopedics;  Laterality: Right;   I & D EXTREMITY Right 08/27/2019   Procedure: PARTIAL CUBOID EXCISION RIGHT FOOT;  Surgeon: [name], MD;   Location: [facility];  Service: Orthopedics;  Laterality: Right;   I & D EXTREMITY Right 01/07/2020   Procedure: RIGHT FOOT EXCISION INFECTED BONE;  Surgeon: [name], MD;  Location: [place];  Service: Orthopedics;  Laterality: Right;   NECK SURGERY     novemver 2019   TRANSURETHRAL RESECTION OF PROSTATE N/A 06/28/2020   Procedure: TRANSURETHRAL RESECTION OF THE PROSTATE (TURP);  Surgeon: [name], MD;  Location: [place];  Service: Urology;  Laterality: N/A;   WISDOM TOOTH EXTRACTION      Family History  Problem Relation Age of Onset   Diabetes Mellitus II Mother    Kidney disease Mother    Diabetes Mellitus II Father    CAD Father    Cancer Father        prostate   Kidney disease Father    Diabetes Mellitus II Brother    Kidney disease Brother    Diabetes Mellitus II Brother    Stomach cancer Brother 22   Kidney disease Brother    Colon cancer Neg Hx    Colon polyps Neg Hx    Esophageal cancer Neg Hx    Rectal cancer Neg Hx    Pancreatic cancer Neg Hx     SOCIAL HISTORY: Social History   Socioeconomic History   Marital status: Married    Spouse name: Not on file   Number of children: 2   Years of education: Not on file   Highest education level: Not on file  Occupational History   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  Tobacco Use   Smoking status: Former    Types: Cigars    Start date: 04/08/1973    Quit date: 12/24/1988    Years since quitting: 32.2   Smokeless tobacco: Never   Tobacco comments:    Cigars and Pipe   Vaping Use   Vaping Use: Never used  Substance and Sexual Activity   Alcohol use: Never   Drug use: Never   Sexual activity: Yes    Partners: Female  Other Topics Concern   Not on file  Social History Narrative   Regular exercise: yes 3 times a week   Caffeine use: hot tea   Social Determinants of Health   Financial Resource Strain: Not on file  Food Insecurity: Not on file  Transportation Needs: Not on file  Physical Activity: Not  on file  Stress: Not on file  Social Connections: Not on file  Intimate Partner Violence: Not on file    Allergies  Allergen Reactions   Mushroom Extract Complex Nausea Only    Current Outpatient Medications  Medication Sig Dispense Refill   acetaminophen (TYLENOL) 500 MG tablet Take 1,000-2,000 mg by mouth 2 (two) times daily as needed for moderate pain.     allopurinol (ZYLOPRIM) 100 MG tablet Take 200 mg by mouth  daily.      amoxicillin-clavulanate (AUGMENTIN) 500-125 MG tablet Take 1 tablet (500 mg total) by mouth daily. Take daily in the evening. 74 tablet 0   B Complex-C-Zn-Folic Acid (DIALYVITE 893 WITH ZINC) 0.8 MG TABS Take 1 tablet by mouth daily.     calcitRIOL (ROCALTROL) 0.5 MCG capsule Take 0.5 mcg by mouth daily.     carvedilol (COREG) 12.5 MG tablet Take 1 tablet (12.5 mg total) by mouth in the morning and at bedtime. 180 tablet 3   Lancets (ONETOUCH DELICA PLUS YBOFBP10C) MISC      levothyroxine (SYNTHROID, LEVOTHROID) 112 MCG tablet Take 112 mcg by mouth daily before breakfast. BRAND NAME SYNTHROID     lidocaine-prilocaine (EMLA) cream Apply 1 application topically 3 (three) times a week.     losartan (COZAAR) 100 MG tablet Take 100 mg by mouth daily.     nitroGLYCERIN (NITRODUR - DOSED IN MG/24 HR) 0.2 mg/hr patch Place 1 patch (0.2 mg total) onto the skin daily. 30 patch 3   oxyCODONE-acetaminophen (PERCOCET/ROXICET) 5-325 MG tablet Take 1 tablet by mouth every 4 (four) hours as needed. 30 tablet 0   pantoprazole (PROTONIX) 40 MG tablet TAKE 1 TABLET BY MOUTH EVERY DAY 90 tablet 2   Polyethyl Glycol-Propyl Glycol (LUBRICANT EYE DROPS) 0.4-0.3 % SOLN Place 1-2 drops into both eyes 3 (three) times daily as needed (dry/irritated eyes.).     pregabalin (LYRICA) 150 MG capsule Take 1 capsule (150 mg total) by mouth in the morning and at bedtime. (Patient taking differently: Take 150-300 mg by mouth See admin instructions. Take 150 mg in the morning and 300 mg at bedtime) 60  capsule 0   prochlorperazine (COMPAZINE) 5 MG tablet TAKE 1 TABLET BY MOUTH TWICE A DAY 60 tablet 3   rosuvastatin (CRESTOR) 10 MG tablet Take 10 mg by mouth daily.     Semaglutide,0.25 or 0.5MG /DOS, (OZEMPIC, 0.25 OR 0.5 MG/DOSE,) 2 MG/1.5ML SOPN Inject 0.5 mg into the skin once a week.     sevelamer carbonate (RENVELA) 800 MG tablet Take 800 mg by mouth 3 (three) times daily.     silver sulfADIAZINE (SILVADENE) 1 % cream Apply 1 application topically daily. 400 g 0   famotidine (PEPCID) 20 MG tablet TAKE 1 TABLET BY MOUTH AFTER SUPPER (Patient not taking: Reported on 03/13/2021) 90 tablet 3   Current Facility-Administered Medications  Medication Dose Route Frequency Provider Last Rate Last Admin   0.9 %  sodium chloride infusion  500 mL Intravenous Continuous Irene Shipper, MD        REVIEW OF SYSTEMS:  [X]  denotes positive finding, [ ]  denotes negative finding Cardiac  Comments:  Chest pain or chest pressure:    Shortness of breath upon exertion:    Short of breath when lying flat:    Irregular heart rhythm:        Vascular    Pain in calf, thigh, or hip brought on by ambulation:    Pain in feet at night that wakes you up from your sleep:     Blood clot in your veins:    Leg swelling:         Pulmonary    Oxygen at home:    Productive cough:     Wheezing:         Neurologic    Sudden weakness in arms or legs:     Sudden numbness in arms or legs:     Sudden onset of difficulty speaking or slurred  speech:    Temporary loss of vision in one eye:     Problems with dizziness:         Gastrointestinal    Blood in stool:     Vomited blood:         Genitourinary    Burning when urinating:     Blood in urine:        Psychiatric    Major depression:         Hematologic    Bleeding problems:    Problems with blood clotting too easily:        Skin    Rashes or ulcers:        Constitutional    Fever or chills:      PHYSICAL EXAM: Vitals:   03/13/21 1204  BP:  130/63  Pulse: 65  Resp: 16  Temp: 97.7 F (36.5 C)  TempSrc: Temporal  SpO2: 99%  Weight: 210 lb (95.3 kg)  Height: 5\' 10"  (1.778 m)    GENERAL: The patient is a well-nourished male, in no acute distress. The vital signs are documented above. CARDIAC: There is a regular rate and rhythm.  VASCULAR:  Palpable femoral pulses bilaterally Palpable DP pulses bilaterally Left foot wound pictured below over the metatarsal head PULMONARY: No respiratory distress. ABDOMEN: Soft and non-tender. MUSCULOSKELETAL: There are no major deformities or cyanosis. NEUROLOGIC: No focal weakness or paresthesias are detected. SKIN: There are no ulcers or rashes noted. PSYCHIATRIC: The patient has a normal affect.     DATA:   ABIs today are noncompressible on the left but he has a biphasic waveform in the dorsalis pedis with a toe pressure of 113 that is pulsatile  Assessment/Plan:  70 year old male presents for evaluation of left foot wound as pictured above and has been under the care of Dr. [name] who has performed ray amputation for osteomyelitis in the foot.  I discussed that he does have a palpable dorsalis pedis pulse in the left foot and has pulsatile toe tracings with a toe pressure of 113 that should be adequate for wound healing.  He certainly has multiple risk factors for vascular disease with his end-stage renal disease and diabetes and I discussed if his wound is not healing we should consider proceeding with arteriogram just to ensure he is optimized from a vascular surgery standpoint for inflow and best chance of healing.  That being said, he and his wife both feel that the wound is making good progress and we agreed on short interval follow-up in about 4 to 6 weeks to ensure that this continues to improve.  If it does not, I think he would benefit from an arteriogram.   Marty Heck, MD [facility]: [phone]

## 2021-03-15 ENCOUNTER — Ambulatory Visit (INDEPENDENT_AMBULATORY_CARE_PROVIDER_SITE_OTHER): Payer: Medicare Other | Admitting: Orthopedic Surgery

## 2021-03-15 DIAGNOSIS — Z89422 Acquired absence of other left toe(s): Secondary | ICD-10-CM

## 2021-03-21 ENCOUNTER — Other Ambulatory Visit: Payer: Self-pay | Admitting: Family

## 2021-03-21 NOTE — Telephone Encounter (Signed)
Rx sent in by [facility].

## 2021-03-23 ENCOUNTER — Ambulatory Visit: Payer: Medicare Other | Admitting: Family

## 2021-03-27 ENCOUNTER — Encounter: Payer: Self-pay | Admitting: Orthopedic Surgery

## 2021-03-27 NOTE — Progress Notes (Signed)
Office Visit Note   Patient: [name]           Date of Birth: 23-Jun-1950           MRN: [id number] Visit Date: 03/15/2021              Requested by: Lorenda Hatchet, FNP [address] [place],  [place] [address] PCP: Lorenda Hatchet, FNP  Chief Complaint  Patient presents with   Left Foot - Routine Post Op    02/02/21 left foot 4th ray amputation       HPI: Patient is a 70 year old gentleman who presents in follow-up he is status post a fourth and fifth ray amputation of the left foot most recent surgery about 5 weeks ago.  He is currently using a nitroglycerin patch and has been on Augmentin.  He is using silver cell and Silvadene dressing changes ultimately.  Patient also states he is a scabbed over area on the lateral aspect of the right foot using a double upright brace.  Assessment & Plan: Visit Diagnoses:  1. History of partial ray amputation of fourth toe of left foot ([place])     Plan: Continue current wound care reviewed protein supplements.  Minimize weightbearing.  Follow-Up Instructions: Return in about 4 weeks (around 04/12/2021).   Ortho Exam  Patient is alert, oriented, no adenopathy, well-dressed, normal affect, normal respiratory effort. Examination patient has a good dorsalis pedis pulse the left foot has granulation tissue about 4 cm in diameter.  The callus is pared the base of the fifth metatarsal on the left.  Patient has a pressure area over the base of the fifth metatarsal bilaterally secondary to Achilles tightness and the callus was pared bilaterally.  Imaging: No results found. No images are attached to the encounter.  Labs: Lab Results  Component Value Date   HGBA1C 6.3 (H) 02/02/2021   HGBA1C 5.4 09/13/2020   HGBA1C 5.7 (H) 06/16/2020   ESRSEDRATE 34 (H) 01/24/2020   ESRSEDRATE 41 (H) 01/10/2020   ESRSEDRATE 55 (H) 12/27/2019   CRP 8.8 (H) 01/24/2020   CRP 35.9 (H) 01/10/2020   CRP 77.9 (H) 12/27/2019   REPTSTATUS  02/05/2021 FINAL 02/02/2021   GRAMSTAIN  02/02/2021    NO WBC SEEN RARE GRAM VARIABLE COCCI RARE GRAM NEGATIVE RODS    CULT  02/02/2021    RARE ENTEROCOCCUS FAECALIS RARE DIPHTHEROIDS(CORYNEBACTERIUM SPECIES) Standardized susceptibility testing for this organism is not available. FEW BACTEROIDES FRAGILIS BETA LACTAMASE POSITIVE Performed at [facility], [place] [name], [name],  [address]    LABORGA ENTEROCOCCUS FAECALIS 02/02/2021     Lab Results  Component Value Date   ALBUMIN 2.6 (L) 02/06/2021   ALBUMIN 2.5 (L) 02/05/2021   ALBUMIN 3.3 (L) 02/02/2021   PREALBUMIN 14.8 (L) 12/14/2018    Lab Results  Component Value Date   MG 2.0 06/28/2020   MG 1.8 06/27/2020   MG 1.9 06/24/2020   No results found for: [facility]  Lab Results  Component Value Date   PREALBUMIN 14.8 (L) 12/14/2018   CBC EXTENDED Latest Ref Rng & Units 02/06/2021 02/03/2021 02/02/2021  WBC 4.0 - 10.5 K/uL 7.7 12.4(H) 11.2(H)  RBC 4.22 - 5.81 MIL/uL 2.92(L) 3.05(L) 3.69(L)  HGB 13.0 - 17.0 g/dL 8.6(L) 9.1(L) 11.1(L)  HCT 39.0 - 52.0 % 25.6(L) 27.6(L) 32.4(L)  PLT 150 - 400 K/uL 156 205 220  NEUTROABS 1.7 - 7.7 K/uL - - -  LYMPHSABS 0.7 - 4.0 K/uL - - -  There is no height or weight on file to calculate BMI.  Orders:  No orders of the defined types were placed in this encounter.  No orders of the defined types were placed in this encounter.    Procedures: No procedures performed  Clinical Data: No additional findings.  ROS:  All other systems negative, except as noted in the HPI. Review of Systems  Objective: Vital Signs: There were no vitals taken for this visit.  Specialty Comments:  No specialty comments available.  PMFS History: Patient Active Problem List   Diagnosis Date Noted   Partial nontraumatic amputation of foot, left ([place]) 02/20/2021   Abscess of bursa of left foot 02/03/2021   Cutaneous abscess of left foot    Acute osteomyelitis of metatarsal  bone of left foot (HCC)    Symptomatic anemia 09/13/2020   COVID-19 virus infection 09/13/2020   Pressure injury of skin 06/29/2020   Gross hematuria 06/16/2020   Typical atrial flutter ([place]) 03/24/2020   Bilateral pleural effusion 02/24/2020   Healthcare maintenance 01/28/2020   Shortness of breath 01/28/2020   History of partial ray amputation of fourth toe of right foot ([place]) 09/08/2019   Ulcerated, foot, right, with necrosis of bone (HCC)    Chronic cough 06/25/2019   Cutaneous abscess of right foot    Subacute osteomyelitis of right foot ([place]) 12/14/2018   AKI (acute kidney injury) ([place]) 12/14/2018   ESRD (end stage renal disease) ([place]) 12/14/2018   Gait abnormality 08/24/2018   Diabetic neuropathy ([place]) 02/06/2018   Cervical myelopathy ([place]) 02/06/2018   Onychomycosis 10/30/2017   Other spondylosis with radiculopathy, cervical region 01/27/2017   Midfoot ulcer, right, limited to breakdown of skin ([place]) 11/15/2016   Lateral epicondylitis, left elbow 08/12/2016   Cellulitis of fifth toe of right foot 07/29/2016   Prostate cancer ([place]) 06/19/2016   Non-pressure chronic ulcer of other part of right foot limited to breakdown of skin ([place]) 04/04/2016   Cellulitis of leg, right 12/24/2015   Cellulitis of right lower extremity 12/24/2015   Gout 09/14/2012   HTN (hypertension) 09/14/2012   Hypothyroidism 09/14/2012   CAD (coronary artery disease) 09/14/2012   Diabetes mellitus ([place]) 09/14/2012   Hypercholesteremia    Neuropathy ([place])    Past Medical History:  Diagnosis Date   Ambulates with cane    straight cane   Cervical myelopathy ([place]) 02/06/2018   Chronic kidney disease    dailysis M W F- home   Complication of anesthesia    Coronary artery disease    Diabetes mellitus without complication ([place])    type2   Diabetic foot ulcer ([place])    Diabetic neuropathy ([place]) 02/06/2018   Gait abnormality 08/24/2018   GERD (gastroesophageal reflux disease)     01/06/20- not  current   Gout    History of blood transfusion    History of kidney stones    passed stones   Hypercholesteremia    Hypertension    Hypothyroidism    Neuromuscular disorder ([place])    neuropathy left leg and bilateral feet   Neuropathy    PONV (postoperative nausea and vomiting)    Prostate cancer ([place])    PVD (peripheral vascular disease) ([place])    with amputations    Family History  Problem Relation Age of Onset   Diabetes Mellitus II Mother    Kidney disease Mother    Diabetes Mellitus II Father    CAD Father    Cancer Father  prostate   Kidney disease Father    Diabetes Mellitus II Brother    Kidney disease Brother    Diabetes Mellitus II Brother    Stomach cancer Brother 46   Kidney disease Brother    Colon cancer Neg Hx    Colon polyps Neg Hx    Esophageal cancer Neg Hx    Rectal cancer Neg Hx    Pancreatic cancer Neg Hx     Past Surgical History:  Procedure Laterality Date   A-FLUTTER ABLATION N/A 04/06/2020   Procedure: A-FLUTTER ABLATION;  Surgeon: [name], MD;  Location: [facility];  Service: Cardiovascular;  Laterality: N/A;   AMPUTATION Left 12/25/2013   Procedure: AMPUTATION RAY LEFT 5TH RAY;  Surgeon: [name], MD;  [facility];  Service: Orthopedics;  Laterality: Left;   AMPUTATION Right 12/15/2018   Procedure: AMPUTATION OF 4TH AND 5TH TOES RIGHT FOOT;  Surgeon: [name], MD;  Location: [facility];  Service: Orthopedics;  Laterality: Right;   AMPUTATION Left 02/02/2021   Procedure: LEFT FOOT 4TH RAY AMPUTATION;  Surgeon: [name], MD;  Location: [facility];  Service: Orthopedics;  Laterality: Left;   APPLICATION OF WOUND VAC Right 12/15/2018   Procedure: APPLICATION OF WOUND VAC;  Surgeon: [name], MD;  Location: [facility];  Service: Orthopedics;  Laterality: Right;   AV FISTULA PLACEMENT Left 03/25/2019   Procedure: LEFT ARM ARTERIOVENOUS (AV) FISTULA CREATION;  Surgeon: [name], MD;  Location: [facility];  Service:  Vascular;  Laterality: Left;   [place] Left 05/20/2019   Procedure: SECOND STAGE LEFT BASCILIC VEIN TRANSPOSITION;  Surgeon: [name], MD;  Location: [facility];  Service: Vascular;  Laterality: Left;   CARDIAC CATHETERIZATION  02/17/2014   CHOLECYSTECTOMY     COLONOSCOPY  2011   in [place], [place]  2008   CYSTOSCOPY N/A 06/29/2020   Procedure: [place];  Surgeon: [name], MD;  Location: [facility];  Service: Urology;  Laterality: N/A;   CYSTOSCOPY WITH FULGERATION Bilateral 06/17/2020   Procedure: CYSTOSCOPY,BILATERAL RETROGRADE, CLOT EVACUATION WITH FULGERATION OF THE BLADDER;  Surgeon: Janith Lima, MD;  Location: [facility];  Service: Urology;  Laterality: Bilateral;   CYSTOSCOPY WITH FULGERATION N/A 06/28/2020   Procedure: CYSTOSCOPY WITH CLOT EVACUATION AND FULGERATION OF BLEEDERS;  Surgeon: [name], MD;  Location: [facility];  Service: Urology;  Laterality: N/A;  1 HR   I & D EXTREMITY Right 12/15/2018   Procedure: DEBRIDEMENT RIGHT FOOT;  Surgeon: [name], MD;  Location: [facility];  Service: Orthopedics;  Laterality: Right;   I & D EXTREMITY Right 08/27/2019   Procedure: PARTIAL CUBOID EXCISION RIGHT FOOT;  Surgeon: [name], MD;  Location: [facility];  Service: Orthopedics;  Laterality: Right;   I & D EXTREMITY Right 01/07/2020   Procedure: RIGHT FOOT EXCISION INFECTED BONE;  Surgeon: [name], MD;  Location: [facility];  Service: Orthopedics;  Laterality: Right;   NECK SURGERY     novemver 2019   TRANSURETHRAL RESECTION OF PROSTATE N/A 06/28/2020   Procedure: TRANSURETHRAL RESECTION OF THE PROSTATE (TURP);  Surgeon: [name], MD;  Location: [facility];  Service: Urology;  Laterality: N/A;   WISDOM TOOTH EXTRACTION     Social History   Occupational History   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

## 2021-04-12 ENCOUNTER — Ambulatory Visit (INDEPENDENT_AMBULATORY_CARE_PROVIDER_SITE_OTHER): Payer: Medicare Other | Admitting: Orthopedic Surgery

## 2021-04-12 ENCOUNTER — Other Ambulatory Visit: Payer: Self-pay

## 2021-04-12 ENCOUNTER — Encounter: Payer: Self-pay | Admitting: Orthopedic Surgery

## 2021-04-12 DIAGNOSIS — M869 Osteomyelitis, unspecified: Secondary | ICD-10-CM

## 2021-04-12 DIAGNOSIS — I2510 Atherosclerotic heart disease of native coronary artery without angina pectoris: Secondary | ICD-10-CM

## 2021-04-12 DIAGNOSIS — Z89422 Acquired absence of other left toe(s): Secondary | ICD-10-CM

## 2021-04-12 NOTE — Progress Notes (Signed)
Office Visit Note   Patient: [name]           Date of Birth: [birth year]           MRN: [id number] Visit Date: 04/12/2021              Requested by: Lorenda Hatchet, [place] [address] [place],  [place] [address] PCP: Lorenda Hatchet, FNP  Chief Complaint  Patient presents with   Left Foot - Follow-up    4th ray amputation 02/02/21      HPI: Patient is a 71 year old gentleman who presents in follow-up for ulceration left foot status post fourth ray amputation 2 months ago.  He is currently on Augmentin daily and using a nitroglycerin patch daily on the dorsum of the foot.  He is changing between silver cell and Silvadene dressing changes to the lateral aspect of the left foot.  Patient states he has noticed increased bleeding recently from the wound.  Patient also complains of painful calluses beneath the base of the fifth metatarsal bilaterally.  Assessment & Plan: Visit Diagnoses:  1. History of partial ray amputation of fourth toe of left foot ([place])   2. Osteomyelitis of fourth toe of left foot ([place])     Plan: Discussed with the patient that the wound is starting to breakdown further with exposed bone.  Discussed that his current wound care regimen and pressure offloading is the best we can do nonoperatively.  Discussed that operative intervention may be necessary.  Patient states he is quite opposed to any operative intervention.  Iodosorb dressing is applied.  Follow-Up Instructions: Return in about 2 weeks (around 04/26/2021).   Ortho Exam  Patient is alert, oriented, no adenopathy, well-dressed, normal affect, normal respiratory effort. Examination patient has hypertrophic callus beneath the fifth metatarsal base bilaterally.  After informed consent a 10 blade knife was used to pare the callus there is no deep open wounds no signs of infection.  Examination of the lateral wound on the left foot there is increased hypertrophic granulation tissue in the  ulcer now probes to bone and third metatarsal head.  Imaging: No results found. No images are attached to the encounter.  Labs: Lab Results  Component Value Date   HGBA1C 6.3 (H) 02/02/2021   HGBA1C 5.4 09/13/2020   HGBA1C 5.7 (H) 06/16/2020   ESRSEDRATE 34 (H) 01/24/2020   ESRSEDRATE 41 (H) 01/10/2020   ESRSEDRATE 55 (H) 12/27/2019   CRP 8.8 (H) 01/24/2020   CRP 35.9 (H) 01/10/2020   CRP 77.9 (H) 12/27/2019   REPTSTATUS 02/05/2021 FINAL 02/02/2021   GRAMSTAIN  02/02/2021    NO WBC SEEN RARE GRAM VARIABLE COCCI RARE GRAM NEGATIVE RODS    CULT  02/02/2021    RARE ENTEROCOCCUS FAECALIS RARE DIPHTHEROIDS(CORYNEBACTERIUM SPECIES) Standardized susceptibility testing for this organism is not available. FEW BACTEROIDES FRAGILIS BETA LACTAMASE POSITIVE Performed at [facility], [place] [address], [place], [place] [address]    LABORGA ENTEROCOCCUS FAECALIS 02/02/2021     Lab Results  Component Value Date   ALBUMIN 2.6 (L) 02/06/2021   ALBUMIN 2.5 (L) 02/05/2021   ALBUMIN 3.3 (L) 02/02/2021   PREALBUMIN 14.8 (L) 12/14/2018    Lab Results  Component Value Date   MG 2.0 06/28/2020   MG 1.8 06/27/2020   MG 1.9 06/24/2020   No results found for: [facility]  Lab Results  Component Value Date   PREALBUMIN 14.8 (L) 12/14/2018   CBC EXTENDED Latest Ref  Rng & Units 02/06/2021 02/03/2021 02/02/2021  WBC 4.0 - 10.5 K/uL 7.7 12.4(H) 11.2(H)  RBC 4.22 - 5.81 MIL/uL 2.92(L) 3.05(L) 3.69(L)  HGB 13.0 - 17.0 g/dL 8.6(L) 9.1(L) 11.1(L)  HCT 39.0 - 52.0 % 25.6(L) 27.6(L) 32.4(L)  PLT 150 - 400 K/uL 156 205 220  NEUTROABS 1.7 - 7.7 K/uL - - -  LYMPHSABS 0.7 - 4.0 K/uL - - -     There is no height or weight on file to calculate BMI.  Orders:  No orders of the defined types were placed in this encounter.  No orders of the defined types were placed in this encounter.    Procedures: No procedures performed  Clinical Data: No additional findings.  ROS:  All other  systems negative, except as noted in the HPI. Review of Systems  Objective: Vital Signs: There were no vitals taken for this visit.  Specialty Comments:  No specialty comments available.  PMFS History: Patient Active Problem List   Diagnosis Date Noted   Partial nontraumatic amputation of foot, left ([place]) 02/20/2021   Abscess of bursa of left foot 02/03/2021   Cutaneous abscess of left foot    Acute osteomyelitis of metatarsal bone of left foot (HCC)    Symptomatic anemia 09/13/2020   COVID-19 virus infection 09/13/2020   Pressure injury of skin 06/29/2020   Gross hematuria 06/16/2020   Typical atrial flutter ([place]) 03/24/2020   Bilateral pleural effusion 02/24/2020   Healthcare maintenance 01/28/2020   Shortness of breath 01/28/2020   History of partial ray amputation of fourth toe of right foot ([place]) 09/08/2019   Ulcerated, foot, right, with necrosis of bone (HCC)    Chronic cough 06/25/2019   Cutaneous abscess of right foot    Subacute osteomyelitis of right foot ([place]) 12/14/2018   AKI (acute kidney injury) ([place]) 12/14/2018   ESRD (end stage renal disease) ([place]) 12/14/2018   Gait abnormality 08/24/2018   Diabetic neuropathy ([place]) 02/06/2018   Cervical myelopathy ([place]) 02/06/2018   Onychomycosis 10/30/2017   Other spondylosis with radiculopathy, cervical region 01/27/2017   Midfoot ulcer, right, limited to breakdown of skin ([place]) 11/15/2016   Lateral epicondylitis, left elbow 08/12/2016   Cellulitis of fifth toe of right foot 07/29/2016   Prostate cancer ([place]) 06/19/2016   Non-pressure chronic ulcer of other part of right foot limited to breakdown of skin ([place]) 04/04/2016   Cellulitis of leg, right 12/24/2015   Cellulitis of right lower extremity 12/24/2015   Gout 09/14/2012   HTN (hypertension) 09/14/2012   Hypothyroidism 09/14/2012   CAD (coronary artery disease) 09/14/2012   Diabetes mellitus ([place]) 09/14/2012   Hypercholesteremia    Neuropathy ([place])    Past  Medical History:  Diagnosis Date   Ambulates with cane    straight cane   Cervical myelopathy ([place]) 02/06/2018   Chronic kidney disease    dailysis M W F- home   Complication of anesthesia    Coronary artery disease    Diabetes mellitus without complication ([place])    type2   Diabetic foot ulcer ([place])    Diabetic neuropathy ([place]) 02/06/2018   Gait abnormality 08/24/2018   GERD (gastroesophageal reflux disease)     01/06/20- not current   Gout    History of blood transfusion    History of kidney stones    passed stones   Hypercholesteremia    Hypertension    Hypothyroidism    Neuromuscular disorder ([place])    neuropathy left leg and bilateral feet   Neuropathy  PONV (postoperative nausea and vomiting)    Prostate cancer ([place])    PVD (peripheral vascular disease) ([place])    with amputations    Family History  Problem Relation Age of Onset   Diabetes Mellitus II Mother    Kidney disease Mother    Diabetes Mellitus II Father    CAD Father    Cancer Father        prostate   Kidney disease Father    Diabetes Mellitus II Brother    Kidney disease Brother    Diabetes Mellitus II Brother    Stomach cancer Brother 31   Kidney disease Brother    Colon cancer Neg Hx    Colon polyps Neg Hx    Esophageal cancer Neg Hx    Rectal cancer Neg Hx    Pancreatic cancer Neg Hx     Past Surgical History:  Procedure Laterality Date   A-FLUTTER ABLATION N/A 04/06/2020   Procedure: A-FLUTTER ABLATION;  Surgeon: [name], MD;  Location: [facility];  Service: Cardiovascular;  Laterality: N/A;   AMPUTATION Left 12/25/2013   Procedure: AMPUTATION RAY LEFT 5TH RAY;  Surgeon: [name], MD;  [facility];  Service: Orthopedics;  Laterality: Left;   AMPUTATION Right 12/15/2018   Procedure: AMPUTATION OF 4TH AND 5TH TOES RIGHT FOOT;  Surgeon: [name], MD;  Location: [facility];  Service: Orthopedics;  Laterality: Right;   AMPUTATION Left 02/02/2021   Procedure: LEFT FOOT  4TH RAY AMPUTATION;  Surgeon: [name], MD;  Location: [facility];  Service: Orthopedics;  Laterality: Left;   APPLICATION OF WOUND VAC Right 12/15/2018   Procedure: APPLICATION OF WOUND VAC;  Surgeon: [name], MD;  Location: [facility];  Service: Orthopedics;  Laterality: Right;   AV FISTULA PLACEMENT Left 03/25/2019   Procedure: LEFT ARM ARTERIOVENOUS (AV) FISTULA CREATION;  Surgeon: [name], MD;  Location: [facility];  Service: Vascular;  Laterality: Left;   [place] Left 05/20/2019   Procedure: SECOND STAGE LEFT BASCILIC VEIN TRANSPOSITION;  Surgeon: [name], MD;  Location: [facility];  Service: Vascular;  Laterality: Left;   CARDIAC CATHETERIZATION  02/17/2014   CHOLECYSTECTOMY     COLONOSCOPY  2011   in [place], [place]  2008   CYSTOSCOPY N/A 06/29/2020   Procedure: [place];  Surgeon: [name], MD;  Location: [facility];  Service: Urology;  Laterality: N/A;   CYSTOSCOPY WITH FULGERATION Bilateral 06/17/2020   Procedure: CYSTOSCOPY,BILATERAL RETROGRADE, CLOT EVACUATION WITH FULGERATION OF THE BLADDER;  Surgeon: Janith Lima, MD;  Location: [facility];  Service: Urology;  Laterality: Bilateral;   CYSTOSCOPY WITH FULGERATION N/A 06/28/2020   Procedure: CYSTOSCOPY WITH CLOT EVACUATION AND FULGERATION OF BLEEDERS;  Surgeon: [name], MD;  Location: [facility];  Service: Urology;  Laterality: N/A;  1 HR   I & D EXTREMITY Right 12/15/2018   Procedure: DEBRIDEMENT RIGHT FOOT;  Surgeon: [name], MD;  Location: [facility];  Service: Orthopedics;  Laterality: Right;   I & D EXTREMITY Right 08/27/2019   Procedure: PARTIAL CUBOID EXCISION RIGHT FOOT;  Surgeon: [name], MD;  Location: [facility];  Service: Orthopedics;  Laterality: Right;   I & D EXTREMITY Right 01/07/2020   Procedure: RIGHT FOOT EXCISION INFECTED BONE;  Surgeon: [name], MD;  Location: [facility];  Service:  Orthopedics;  Laterality: Right;   NECK SURGERY     novemver 2019   TRANSURETHRAL RESECTION OF PROSTATE N/A 06/28/2020   Procedure: TRANSURETHRAL RESECTION OF THE PROSTATE (TURP);  Surgeon: [name], MD;  Location: [facility];  Service: Urology;  Laterality: N/A;   WISDOM TOOTH EXTRACTION     Social History   Occupational History   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  Tobacco Use   Smoking status: Former    Types: Cigars    Start date: 04/08/1973    Quit date: 12/24/1988    Years since quitting: 32.3   Smokeless tobacco: Never   Tobacco comments:    Cigars and Pipe   Vaping Use   Vaping Use: Never used  Substance and Sexual Activity   Alcohol use: Never   Drug use: Never   Sexual activity: Yes    Partners: Female

## 2021-04-16 ENCOUNTER — Other Ambulatory Visit: Payer: Self-pay | Admitting: Internal Medicine

## 2021-04-19 ENCOUNTER — Ambulatory Visit (INDEPENDENT_AMBULATORY_CARE_PROVIDER_SITE_OTHER): Payer: Medicare Other | Admitting: Orthopedic Surgery

## 2021-04-19 ENCOUNTER — Encounter: Payer: Self-pay | Admitting: Orthopedic Surgery

## 2021-04-19 DIAGNOSIS — Z89422 Acquired absence of other left toe(s): Secondary | ICD-10-CM

## 2021-04-19 MED ORDER — AMOXICILLIN-POT CLAVULANATE 500-125 MG PO TABS: 1.0000 | ORAL_TABLET | Freq: Every day | ORAL | 0 refills | Status: DC

## 2021-04-19 NOTE — Progress Notes (Signed)
Office Visit Note   Patient: [name]           Date of Birth: February 03, 1951           MRN: [id number] Visit Date: 04/19/2021              Requested by: Lorenda Hatchet, [place] [address] [place],  [place] [address] PCP: Lorenda Hatchet, FNP  Chief Complaint  Patient presents with   Left Foot - Follow-up      HPI: Patient is a 71 year old gentleman who presents complaining of increasing drainage from the ulcer left foot status post fourth and fifth ray amputation.  Patient has worked at [employer] for double upright braces for both lower extremities.  Assessment & Plan: Visit Diagnoses:  1. History of partial ray amputation of fourth toe of left foot ([place])     Plan: With the progressive ulceration osteomyelitis of the left foot discussed patient's recommended treatment would be to proceed with a transtibial amputation postoperative care was discussed potential for inpatient versus outpatient rehab.  We will refill his Augmentin.  We will set up surgery at patient's earliest convenience.  Follow-Up Instructions: Return in about 3 weeks (around 05/10/2021).   Ortho Exam  Patient is alert, oriented, no adenopathy, well-dressed, normal affect, normal respiratory effort. Examination patient has increased hypergranulation tissue over the third metatarsal head.  He has a painful ischemic ulcer of the base of the fifth metatarsal left foot.  There is exposed bone of the third metatarsal head within the wound.  There is no ascending cellulitis.  Patient's most recent ankle-brachial indices was in December.  Ankle pressures were noncompressible toe pressures showed a normal great toe pressure.  Imaging: No results found. No images are attached to the encounter.  Labs: Lab Results  Component Value Date   HGBA1C 6.3 (H) 02/02/2021   HGBA1C 5.4 09/13/2020   HGBA1C 5.7 (H) 06/16/2020   ESRSEDRATE 34 (H) 01/24/2020   ESRSEDRATE 41 (H) 01/10/2020   ESRSEDRATE 55 (H)  12/27/2019   CRP 8.8 (H) 01/24/2020   CRP 35.9 (H) 01/10/2020   CRP 77.9 (H) 12/27/2019   REPTSTATUS 02/05/2021 FINAL 02/02/2021   GRAMSTAIN  02/02/2021    NO WBC SEEN RARE GRAM VARIABLE COCCI RARE GRAM NEGATIVE RODS    CULT  02/02/2021    RARE ENTEROCOCCUS FAECALIS RARE DIPHTHEROIDS(CORYNEBACTERIUM SPECIES) Standardized susceptibility testing for this organism is not available. FEW BACTEROIDES FRAGILIS BETA LACTAMASE POSITIVE Performed at [facility], [place] [address] [place], [place] [address]    LABORGA ENTEROCOCCUS FAECALIS 02/02/2021     Lab Results  Component Value Date   ALBUMIN 2.6 (L) 02/06/2021   ALBUMIN 2.5 (L) 02/05/2021   ALBUMIN 3.3 (L) 02/02/2021   PREALBUMIN 14.8 (L) 12/14/2018    Lab Results  Component Value Date   MG 2.0 06/28/2020   MG 1.8 06/27/2020   MG 1.9 06/24/2020   No results found for: [facility]  Lab Results  Component Value Date   PREALBUMIN 14.8 (L) 12/14/2018   CBC EXTENDED Latest Ref Rng & Units 02/06/2021 02/03/2021 02/02/2021  WBC 4.0 - 10.5 K/uL 7.7 12.4(H) 11.2(H)  RBC 4.22 - 5.81 MIL/uL 2.92(L) 3.05(L) 3.69(L)  HGB 13.0 - 17.0 g/dL 8.6(L) 9.1(L) 11.1(L)  HCT 39.0 - 52.0 % 25.6(L) 27.6(L) 32.4(L)  PLT 150 - 400 K/uL 156 205 220  NEUTROABS 1.7 - 7.7 K/uL - - -  LYMPHSABS 0.7 - 4.0 K/uL - - -  There is no height or weight on file to calculate BMI.  Orders:  No orders of the defined types were placed in this encounter.  Meds ordered this encounter  Medications   amoxicillin-clavulanate (AUGMENTIN) 500-125 MG tablet    Sig: Take 1 tablet (500 mg total) by mouth daily. Take daily in the evening.    Dispense:  30 tablet    Refill:  0     Procedures: No procedures performed  Clinical Data: No additional findings.  ROS:  All other systems negative, except as noted in the HPI. Review of Systems  Objective: Vital Signs: There were no vitals taken for this visit.  Specialty Comments:  No specialty comments  available.  PMFS History: Patient Active Problem List   Diagnosis Date Noted   Partial nontraumatic amputation of foot, left ([place]) 02/20/2021   Abscess of bursa of left foot 02/03/2021   Cutaneous abscess of left foot    Acute osteomyelitis of metatarsal bone of left foot (HCC)    Symptomatic anemia 09/13/2020   COVID-19 virus infection 09/13/2020   Pressure injury of skin 06/29/2020   Gross hematuria 06/16/2020   Typical atrial flutter ([place]) 03/24/2020   Bilateral pleural effusion 02/24/2020   Healthcare maintenance 01/28/2020   Shortness of breath 01/28/2020   History of partial ray amputation of fourth toe of right foot ([place]) 09/08/2019   Ulcerated, foot, right, with necrosis of bone (HCC)    Chronic cough 06/25/2019   Cutaneous abscess of right foot    Subacute osteomyelitis of right foot ([place]) 12/14/2018   AKI (acute kidney injury) ([place]) 12/14/2018   ESRD (end stage renal disease) ([place]) 12/14/2018   Gait abnormality 08/24/2018   Diabetic neuropathy ([place]) 02/06/2018   Cervical myelopathy ([place]) 02/06/2018   Onychomycosis 10/30/2017   Other spondylosis with radiculopathy, cervical region 01/27/2017   Midfoot ulcer, right, limited to breakdown of skin ([place]) 11/15/2016   Lateral epicondylitis, left elbow 08/12/2016   Cellulitis of fifth toe of right foot 07/29/2016   Prostate cancer ([place]) 06/19/2016   Non-pressure chronic ulcer of other part of right foot limited to breakdown of skin ([place]) 04/04/2016   Cellulitis of leg, right 12/24/2015   Cellulitis of right lower extremity 12/24/2015   Gout 09/14/2012   HTN (hypertension) 09/14/2012   Hypothyroidism 09/14/2012   CAD (coronary artery disease) 09/14/2012   Diabetes mellitus ([place]) 09/14/2012   Hypercholesteremia    Neuropathy ([place])    Past Medical History:  Diagnosis Date   Ambulates with cane    straight cane   Cervical myelopathy ([place]) 02/06/2018   Chronic kidney disease    dailysis M W F- home   Complication of  anesthesia    Coronary artery disease    Diabetes mellitus without complication ([place])    type2   Diabetic foot ulcer ([place])    Diabetic neuropathy ([place]) 02/06/2018   Gait abnormality 08/24/2018   GERD (gastroesophageal reflux disease)     01/06/20- not current   Gout    History of blood transfusion    History of kidney stones    passed stones   Hypercholesteremia    Hypertension    Hypothyroidism    Neuromuscular disorder (HCC)    neuropathy left leg and bilateral feet   Neuropathy    PONV (postoperative nausea and vomiting)    Prostate cancer ([place])    PVD (peripheral vascular disease) ([place])    with amputations    Family History  Problem Relation Age of Onset  Diabetes Mellitus II Mother    Kidney disease Mother    Diabetes Mellitus II Father    CAD Father    Cancer Father        prostate   Kidney disease Father    Diabetes Mellitus II Brother    Kidney disease Brother    Diabetes Mellitus II Brother    Stomach cancer Brother 57   Kidney disease Brother    Colon cancer Neg Hx    Colon polyps Neg Hx    Esophageal cancer Neg Hx    Rectal cancer Neg Hx    Pancreatic cancer Neg Hx     Past Surgical History:  Procedure Laterality Date   A-FLUTTER ABLATION N/A 04/06/2020   Procedure: A-FLUTTER ABLATION;  Surgeon: [name], MD;  Location: [facility];  Service: Cardiovascular;  Laterality: N/A;   AMPUTATION Left 12/25/2013   Procedure: AMPUTATION RAY LEFT 5TH RAY;  Surgeon: [name], MD;  Location: [facility];  Service: Orthopedics;  Laterality: Left;   AMPUTATION Right 12/15/2018   Procedure: AMPUTATION OF 4TH AND 5TH TOES RIGHT FOOT;  Surgeon: [name], MD;  Location: [facility];  Service: Orthopedics;  Laterality: Right;   AMPUTATION Left 02/02/2021   Procedure: LEFT FOOT 4TH RAY AMPUTATION;  Surgeon: [name], MD;  Location: [facility];  Service: Orthopedics;  Laterality: Left;   APPLICATION OF WOUND VAC Right 12/15/2018   Procedure: APPLICATION OF WOUND  VAC;  Surgeon: [name], MD;  Location: [facility];  Service: Orthopedics;  Laterality: Right;   AV FISTULA PLACEMENT Left 03/25/2019   Procedure: LEFT ARM ARTERIOVENOUS (AV) FISTULA CREATION;  Surgeon: [name], MD;  Location: [facility];  Service: Vascular;  Laterality: Left;   [place] Left 05/20/2019   Procedure: SECOND STAGE LEFT BASCILIC VEIN TRANSPOSITION;  Surgeon: [name], MD;  Location: [facility];  Service: Vascular;  Laterality: Left;   CARDIAC CATHETERIZATION  02/17/2014   CHOLECYSTECTOMY     COLONOSCOPY  2011   in [place], [place]  2008   CYSTOSCOPY N/A 06/29/2020   Procedure: [place];  Surgeon: [name], MD;  Location: [facility];  Service: Urology;  Laterality: N/A;   CYSTOSCOPY WITH FULGERATION Bilateral 06/17/2020   Procedure: CYSTOSCOPY,BILATERAL RETROGRADE, CLOT EVACUATION WITH FULGERATION OF THE BLADDER;  Surgeon: Janith Lima, MD;  Location: [facility];  Service: Urology;  Laterality: Bilateral;   CYSTOSCOPY WITH FULGERATION N/A 06/28/2020   Procedure: CYSTOSCOPY WITH CLOT EVACUATION AND FULGERATION OF BLEEDERS;  Surgeon: [name], MD;  Location: [facility];  Service: Urology;  Laterality: N/A;  1 HR   I & D EXTREMITY Right 12/15/2018   Procedure: DEBRIDEMENT RIGHT FOOT;  Surgeon: [name], MD;  Location: [facility];  Service: Orthopedics;  Laterality: Right;   I & D EXTREMITY Right 08/27/2019   Procedure: PARTIAL CUBOID EXCISION RIGHT FOOT;  Surgeon: [name], MD;  Location: [facility];  Service: Orthopedics;  Laterality: Right;   I & D EXTREMITY Right 01/07/2020   Procedure: RIGHT FOOT EXCISION INFECTED BONE;  Surgeon: [name], MD;  Location: [facility];  Service: Orthopedics;  Laterality: Right;   NECK SURGERY     novemver 2019   TRANSURETHRAL RESECTION OF PROSTATE N/A 06/28/2020   Procedure: TRANSURETHRAL RESECTION OF THE PROSTATE (TURP);   Surgeon: [name], MD;  Location: [facility];  Service: Urology;  Laterality: N/A;   WISDOM TOOTH EXTRACTION     Social History   Occupational History   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  Tobacco Use   Smoking status: Former    Types: Cigars    Start date: 04/08/1973    Quit date: 12/24/1988    Years since quitting: 32.3   Smokeless tobacco: Never   Tobacco comments:    Cigars and Pipe   Vaping Use   Vaping Use: Never used  Substance and Sexual Activity   Alcohol use: Never   Drug use: Never   Sexual activity: Yes    Partners: Female

## 2021-04-24 ENCOUNTER — Ambulatory Visit: Payer: Medicare Other | Admitting: Vascular Surgery

## 2021-04-26 ENCOUNTER — Ambulatory Visit: Payer: Medicare Other | Admitting: Orthopedic Surgery

## 2021-05-01 ENCOUNTER — Telehealth: Payer: Self-pay | Admitting: Orthopedic Surgery

## 2021-05-01 NOTE — Telephone Encounter (Signed)
Pt called stating he would like a CB to let him know what time his surgery is on Wednesday.   [phone]

## 2021-05-01 NOTE — Telephone Encounter (Signed)
Can you please advise when pt should arrive.

## 2021-05-03 NOTE — Progress Notes (Signed)
Surgical Instructions    Your procedure is scheduled on 05/09/21.  Report to [facility] Main Entrance "A" at 6:30 A.M., then check in with the Admitting office.  Call this number if you have problems the morning of surgery:  [phone]   If you have any questions prior to your surgery date call [phone]: Open Monday-Friday 8am-4pm    Remember:  Do not eat after midnight the night before your surgery  You may drink clear liquids until 5:30 the morning of your surgery.   Clear liquids allowed are: Water, Non-Citrus Juices (without pulp), Carbonated Beverages, Clear Tea, Black Coffee ONLY (NO MILK, CREAM OR POWDERED CREAMER of any kind), and Gatorade    Take these medicines the morning of surgery with A SIP OF WATER: allopurinol (ZYLOPRIM) carvedilol (COREG)  levothyroxine (SYNTHROID, LEVOTHROID) pregabalin (LYRICA) prochlorperazine (COMPAZINE) rosuvastatin (CRESTOR) nitroGLYCERIN (NITRODUR - DOSED IN MG/24 HR)  Polyethyl Glycol-Propyl Glycol (LUBRICANT EYE DROPS)  AS NEEDED: acetaminophen (TYLENOL) oxyCODONE-acetaminophen (PERCOCET/ROXICET)   As of today, STOP taking any Aspirin (unless otherwise instructed by your surgeon) Aleve, Naproxen, Ibuprofen, Motrin, Advil, Goody's, BC's, all herbal medications, fish oil, and all vitamins.  WHAT DO I DO ABOUT MY DIABETES MEDICATION?    The day of surgery, do not take diabetes injectables, including Byetta (exenatide), Bydureon (exenatide ER), Victoza (liraglutide), or Trulicity (dulaglutide) and Ozempic.   HOW TO MANAGE YOUR DIABETES BEFORE AND AFTER SURGERY  Why is it important to control my blood sugar before and after surgery? Improving blood sugar levels before and after surgery helps healing and can limit problems. A way of improving blood sugar control is eating a healthy diet by:  Eating less sugar and carbohydrates  Increasing activity/exercise  Talking with your doctor about reaching your blood sugar goals High  blood sugars (greater than 180 mg/dL) can raise your risk of infections and slow your recovery, so you will need to focus on controlling your diabetes during the weeks before surgery. Make sure that the doctor who takes care of your diabetes knows about your planned surgery including the date and location.  How do I manage my blood sugar before surgery? Check your blood sugar at least 4 times a day, starting 2 days before surgery, to make sure that the level is not too high or low.  Check your blood sugar the morning of your surgery when you wake up and every 2 hours until you get to the Short Stay unit.  If your blood sugar is less than 70 mg/dL, you will need to treat for low blood sugar: Do not take insulin. Treat a low blood sugar (less than 70 mg/dL) with  cup of clear juice (cranberry or apple), 4 glucose tablets, OR glucose gel. Recheck blood sugar in 15 minutes after treatment (to make sure it is greater than 70 mg/dL). If your blood sugar is not greater than 70 mg/dL on recheck, call [phone] for further instructions. Report your blood sugar to the short stay nurse when you get to Short Stay.  If you are admitted to the hospital after surgery: Your blood sugar will be checked by the staff and you will probably be given insulin after surgery (instead of oral diabetes medicines) to make sure you have good blood sugar levels. The goal for blood sugar control after surgery is 80-180 mg/dL.   After your COVID test   You are not required to quarantine however you are required to wear a well-fitting mask when you are out and around people not  in your household.  If your mask becomes wet or soiled, replace with a new one.  Wash your hands often with soap and water for 20 seconds or clean your hands with an alcohol-based hand sanitizer that contains at least 60% alcohol.  Do not share personal items.  Notify your provider: if you are in close contact with someone who has COVID  or if  you develop a fever of 100.4 or greater, sneezing, cough, sore throat, shortness of breath or body aches.           Do not wear jewelry  Do not wear lotions, powders, colognes, or deodorant. Do not shave 48 hours prior to surgery.  Men may shave face and neck. Do not bring valuables to the hospital.              [facility] is not responsible for any belongings or valuables.  Do NOT Smoke (Tobacco/Vaping)  24 hours prior to your procedure  If you use a CPAP at night, you may bring your mask for your overnight stay.   Contacts, glasses, hearing aids, dentures or partials may not be worn into surgery, please bring cases for these belongings   For patients admitted to the hospital, discharge time will be determined by your treatment team.   Patients discharged the day of surgery will not be allowed to drive home, and someone needs to stay with them for 24 hours.  NO VISITORS WILL BE ALLOWED IN PRE-OP WHERE PATIENTS ARE PREPPED FOR SURGERY.  ONLY 1 SUPPORT PERSON MAY BE PRESENT IN THE WAITING ROOM WHILE YOU ARE IN SURGERY.  IF YOU ARE TO BE ADMITTED, ONCE YOU ARE IN YOUR ROOM YOU WILL BE ALLOWED TWO (2) VISITORS. 1 (ONE) VISITOR MAY STAY OVERNIGHT BUT MUST ARRIVE TO THE ROOM BY 8pm.  Minor children may have two parents present. Special consideration for safety and communication needs will be reviewed on a case by case basis.  Special instructions:    Oral Hygiene is also important to reduce your risk of infection.  Remember - BRUSH YOUR TEETH THE MORNING OF SURGERY WITH YOUR REGULAR TOOTHPASTE   [place] [facility]- Preparing For Surgery  Before surgery, you can play an important role. Because skin is not sterile, your skin needs to be as free of germs as possible. You can reduce the number of germs on your skin by washing with CHG (chlorahexidine gluconate) Soap before surgery.  CHG is an antiseptic cleaner which kills germs and bonds with the skin to continue killing germs even after washing.      Please do not use if you have an allergy to CHG or antibacterial soaps. If your skin becomes reddened/irritated stop using the CHG.  Do not shave (including legs and underarms) for at least 48 hours prior to first CHG shower. It is OK to shave your face.  Please follow these instructions carefully.     Shower the NIGHT BEFORE SURGERY and the MORNING OF SURGERY with CHG Soap.   If you chose to wash your hair, wash your hair first as usual with your normal shampoo. After you shampoo, rinse your hair and body thoroughly to remove the shampoo.  Then [employer] and genitals (private parts) with your normal soap and rinse thoroughly to remove soap.  After that Use CHG Soap as you would any other liquid soap. You can apply CHG directly to the skin and wash gently with a scrungie or a clean washcloth.   Apply the CHG  Soap to your body ONLY FROM THE NECK DOWN.  Do not use on open wounds or open sores. Avoid contact with your eyes, ears, mouth and genitals (private parts). Wash Face and genitals (private parts)  with your normal soap.   Wash thoroughly, paying special attention to the area where your surgery will be performed.  Thoroughly rinse your body with warm water from the neck down.  DO NOT shower/wash with your normal soap after using and rinsing off the CHG Soap.  Pat yourself dry with a CLEAN TOWEL.  Wear CLEAN PAJAMAS to bed the night before surgery  Place CLEAN SHEETS on your bed the night before your surgery  DO NOT SLEEP WITH PETS.   Day of Surgery:  Take a shower with CHG soap. Wear Clean/Comfortable clothing the morning of surgery Do not apply any deodorants/lotions.   Remember to brush your teeth WITH YOUR REGULAR TOOTHPASTE.   Please read over the following fact sheets that you were given.

## 2021-05-04 ENCOUNTER — Other Ambulatory Visit: Payer: Self-pay

## 2021-05-04 ENCOUNTER — Encounter (HOSPITAL_COMMUNITY): Payer: Self-pay | Admitting: Orthopedic Surgery

## 2021-05-04 ENCOUNTER — Encounter: Payer: Self-pay | Admitting: Cardiology

## 2021-05-04 ENCOUNTER — Encounter (HOSPITAL_COMMUNITY)
Admission: RE | Admit: 2021-05-04 | Discharge: 2021-05-04 | Disposition: A | Discharge status: Home / Self Care | Payer: Medicare Other | Source: Ambulatory Visit | Attending: Orthopedic Surgery | Admitting: Orthopedic Surgery

## 2021-05-04 ENCOUNTER — Ambulatory Visit: Payer: Medicare Other | Admitting: Cardiology

## 2021-05-04 VITALS — BP 130/64 | HR 60 | Temp 98.3°F | Resp 17 | Ht 70.0 in | Wt 215.0 lb

## 2021-05-04 DIAGNOSIS — E1140 Type 2 diabetes mellitus with diabetic neuropathy, unspecified: Secondary | ICD-10-CM | POA: Insufficient documentation

## 2021-05-04 DIAGNOSIS — L97509 Non-pressure chronic ulcer of other part of unspecified foot with unspecified severity: Secondary | ICD-10-CM | POA: Diagnosis not present

## 2021-05-04 DIAGNOSIS — I4891 Unspecified atrial fibrillation: Secondary | ICD-10-CM | POA: Diagnosis not present

## 2021-05-04 DIAGNOSIS — E1122 Type 2 diabetes mellitus with diabetic chronic kidney disease: Secondary | ICD-10-CM | POA: Diagnosis not present

## 2021-05-04 DIAGNOSIS — Z992 Dependence on renal dialysis: Secondary | ICD-10-CM | POA: Insufficient documentation

## 2021-05-04 DIAGNOSIS — E1151 Type 2 diabetes mellitus with diabetic peripheral angiopathy without gangrene: Secondary | ICD-10-CM | POA: Insufficient documentation

## 2021-05-04 DIAGNOSIS — N186 End stage renal disease: Secondary | ICD-10-CM

## 2021-05-04 DIAGNOSIS — Z923 Personal history of irradiation: Secondary | ICD-10-CM | POA: Diagnosis not present

## 2021-05-04 DIAGNOSIS — Z89421 Acquired absence of other right toe(s): Secondary | ICD-10-CM | POA: Diagnosis not present

## 2021-05-04 DIAGNOSIS — E1121 Type 2 diabetes mellitus with diabetic nephropathy: Secondary | ICD-10-CM | POA: Diagnosis not present

## 2021-05-04 DIAGNOSIS — Z8546 Personal history of malignant neoplasm of prostate: Secondary | ICD-10-CM | POA: Insufficient documentation

## 2021-05-04 DIAGNOSIS — Z01812 Encounter for preprocedural laboratory examination: Secondary | ICD-10-CM | POA: Insufficient documentation

## 2021-05-04 DIAGNOSIS — Z89422 Acquired absence of other left toe(s): Secondary | ICD-10-CM | POA: Insufficient documentation

## 2021-05-04 DIAGNOSIS — M869 Osteomyelitis, unspecified: Secondary | ICD-10-CM | POA: Insufficient documentation

## 2021-05-04 DIAGNOSIS — Z87891 Personal history of nicotine dependence: Secondary | ICD-10-CM | POA: Insufficient documentation

## 2021-05-04 DIAGNOSIS — I120 Hypertensive chronic kidney disease with stage 5 chronic kidney disease or end stage renal disease: Secondary | ICD-10-CM | POA: Insufficient documentation

## 2021-05-04 DIAGNOSIS — I4892 Unspecified atrial flutter: Secondary | ICD-10-CM | POA: Diagnosis not present

## 2021-05-04 DIAGNOSIS — Z9079 Acquired absence of other genital organ(s): Secondary | ICD-10-CM | POA: Insufficient documentation

## 2021-05-04 DIAGNOSIS — Z8616 Personal history of COVID-19: Secondary | ICD-10-CM | POA: Insufficient documentation

## 2021-05-04 DIAGNOSIS — I2510 Atherosclerotic heart disease of native coronary artery without angina pectoris: Secondary | ICD-10-CM | POA: Diagnosis not present

## 2021-05-04 DIAGNOSIS — Z794 Long term (current) use of insulin: Secondary | ICD-10-CM | POA: Diagnosis not present

## 2021-05-04 DIAGNOSIS — E11621 Type 2 diabetes mellitus with foot ulcer: Secondary | ICD-10-CM | POA: Diagnosis not present

## 2021-05-04 DIAGNOSIS — E1169 Type 2 diabetes mellitus with other specified complication: Secondary | ICD-10-CM | POA: Insufficient documentation

## 2021-05-04 DIAGNOSIS — I4821 Permanent atrial fibrillation: Secondary | ICD-10-CM

## 2021-05-04 DIAGNOSIS — E039 Hypothyroidism, unspecified: Secondary | ICD-10-CM | POA: Diagnosis not present

## 2021-05-04 DIAGNOSIS — Z951 Presence of aortocoronary bypass graft: Secondary | ICD-10-CM | POA: Diagnosis not present

## 2021-05-04 DIAGNOSIS — K219 Gastro-esophageal reflux disease without esophagitis: Secondary | ICD-10-CM | POA: Insufficient documentation

## 2021-05-04 DIAGNOSIS — I10 Essential (primary) hypertension: Secondary | ICD-10-CM

## 2021-05-04 DIAGNOSIS — E7800 Pure hypercholesterolemia, unspecified: Secondary | ICD-10-CM | POA: Diagnosis not present

## 2021-05-04 DIAGNOSIS — I4819 Other persistent atrial fibrillation: Secondary | ICD-10-CM | POA: Insufficient documentation

## 2021-05-04 LAB — HEMOGLOBIN A1C
Hgb A1c MFr Bld: 5.5 % (ref 4.8–5.6)
Mean Plasma Glucose: 111.15 mg/dL

## 2021-05-04 LAB — CBC
HCT: 29.2 % — ABNORMAL LOW (ref 39.0–52.0)
Hemoglobin: 10 g/dL — ABNORMAL LOW (ref 13.0–17.0)
MCH: 30.8 pg (ref 26.0–34.0)
MCHC: 34.2 g/dL (ref 30.0–36.0)
MCV: 89.8 fL (ref 80.0–100.0)
Platelets: 123 10*3/uL — ABNORMAL LOW (ref 150–400)
RBC: 3.25 MIL/uL — ABNORMAL LOW (ref 4.22–5.81)
RDW: 15.5 % (ref 11.5–15.5)
WBC: 7.5 10*3/uL (ref 4.0–10.5)
nRBC: 0 % (ref 0.0–0.2)

## 2021-05-04 LAB — GLUCOSE, CAPILLARY: Glucose-Capillary: 126 mg/dL — ABNORMAL HIGH (ref 70–99)

## 2021-05-04 NOTE — Progress Notes (Signed)
PCP - Lorenda Hatchet FNP Cardiologist - Dr. Adrian Prows Nephrologists-Dr. Patel,Dr. Hollie Salk  PPM/ICD - denies Device Orders -  Rep Notified -   Chest x-ray - 05/26/20 EKG - 05/04/20 Stress Test - 03/20/20 ECHO - 03/17/20 Cardiac Cath -   Sleep Study -denies  CPAP -   Fasting Blood Sugar - 150's Checks Blood Sugar ___2__ times a week  Blood Thinner Instructions: Aspirin Instructions:  ERAS Protcol -clear liquids until 0530 PRE-SURGERY Ensure or G2-   COVID TEST- scheduled 05/07/21   Anesthesia review:yes-hx a fib,on home hemodialysis   Patient denies shortness of breath, fever, cough and chest pain at PAT appointment   All instructions explained to the patient, with a verbal understanding of the material. Patient agrees to go over the instructions while at home for a better understanding. Patient also instructed to self quarantine after being tested for COVID-19. The opportunity to ask questions was provided.

## 2021-05-04 NOTE — Progress Notes (Signed)
Primary Physician/Referring:  Lorenda Hatchet, FNP  Patient ID: [name], male    DOB: 1950-08-08, 71 y.o.   MRN: [id number]  No chief complaint on file.   HPI:    TERRAN KLINKE  is a 71 y.o.  Caucasian male with CABG in [place] 02/18/2004: RIMA to LAD, LIMA to obtuse marginal, SVG to diagonal and SVG to PDA, history of atrial flutter ablation on 04/06/2020 but noted to have atrial fibrillation in July 2022, inability to tolerate anticoagulation due to severe hematuria and unable to tolerate antiplatelet agents due to GI bleed, diabetes mellitus, diabetic nephropathy with ESRD on HD at home, peripheral neuropathy and diabetic foot ulcer right, S/P Sim's amputation of the right foot and now has chronic ulcer, amputation of his left 2 toes due to diabetic foot ulcer in the past, now has been scheduled for left below-knee amputation.  He performs home dialysis, accompanied by his wife.  Denies chest pain or dyspnea or PND or orthopnea.  He has Charcot's foot right.  No fever or chills.  Past Medical History:  Diagnosis Date   Ambulates with cane    straight cane   Cervical myelopathy ([place]) 02/06/2018   Chronic kidney disease    dailysis M W F- home   Complication of anesthesia    Coronary artery disease    Diabetes mellitus without complication ([place])    type2   Diabetic foot ulcer ([place])    Diabetic neuropathy ([place]) 02/06/2018   Gait abnormality 08/24/2018   GERD (gastroesophageal reflux disease)     01/06/20- not current   Gout    History of blood transfusion    History of kidney stones    passed stones   Hypercholesteremia    Hypertension    Hypothyroidism    Neuromuscular disorder (HCC)    neuropathy left leg and bilateral feet   Neuropathy    PONV (postoperative nausea and vomiting)    Prostate cancer (HCC)    PVD (peripheral vascular disease) ([place])    with amputations   Past Surgical History:  Procedure Laterality Date   A-FLUTTER ABLATION N/A 04/06/2020    Procedure: A-FLUTTER ABLATION;  Surgeon: [name], MD;  Location: [facility];  Service: Cardiovascular;  Laterality: N/A;   AMPUTATION Left 12/25/2013   Procedure: AMPUTATION RAY LEFT 5TH RAY;  Surgeon: [name], MD;  Location: [facility];  Service: Orthopedics;  Laterality: Left;   AMPUTATION Right 12/15/2018   Procedure: AMPUTATION OF 4TH AND 5TH TOES RIGHT FOOT;  Surgeon: [name], MD;  Location: [place];  Service: Orthopedics;  Laterality: Right;   AMPUTATION Left 02/02/2021   Procedure: LEFT FOOT 4TH RAY AMPUTATION;  Surgeon: [name], MD;  Location: [place];  Service: Orthopedics;  Laterality: Left;   APPLICATION OF WOUND VAC Right 12/15/2018   Procedure: APPLICATION OF WOUND VAC;  Surgeon: [name], MD;  Location: [place];  Service: Orthopedics;  Laterality: Right;   AV FISTULA PLACEMENT Left 03/25/2019   Procedure: LEFT ARM ARTERIOVENOUS (AV) FISTULA CREATION;  Surgeon: [name], MD;  Location: [place];  Service: Vascular;  Laterality: Left;   [place] Left 05/20/2019   Procedure: SECOND STAGE LEFT BASCILIC VEIN TRANSPOSITION;  Surgeon: [name], MD;  Location: [facility];  Service: Vascular;  Laterality: Left;   CARDIAC CATHETERIZATION  02/17/2014   CHOLECYSTECTOMY     COLONOSCOPY  2011   in  [facility], Normal   CORONARY ARTERY BYPASS GRAFT  2008   CYSTOSCOPY N/A 06/29/2020   Procedure: CYSTOSCOPY WITH CLOT EVACUATION AND  FULGERATION;  Surgeon: [name], MD;  Location: [place];  Service: Urology;  Laterality: N/A;   CYSTOSCOPY WITH FULGERATION Bilateral 06/17/2020   Procedure: CYSTOSCOPY,BILATERAL RETROGRADE, CLOT EVACUATION WITH FULGERATION OF THE BLADDER;  Surgeon: Janith Lima, MD;  Location: [place];  Service: Urology;  Laterality: Bilateral;   CYSTOSCOPY WITH FULGERATION N/A 06/28/2020   Procedure: CYSTOSCOPY WITH CLOT EVACUATION AND FULGERATION OF BLEEDERS;  Surgeon: [name], MD;  Location: [place];  Service: Urology;  Laterality: N/A;  1 HR   I & D EXTREMITY Right 12/15/2018   Procedure: DEBRIDEMENT RIGHT FOOT;  Surgeon: [name], MD;  Location: [place];  Service: Orthopedics;  Laterality: Right;   I & D EXTREMITY Right 08/27/2019   Procedure: PARTIAL CUBOID EXCISION RIGHT FOOT;  Surgeon: [name], MD;  Location: [place];  Service: Orthopedics;  Laterality: Right;   I & D EXTREMITY Right 01/07/2020   Procedure: RIGHT FOOT EXCISION INFECTED BONE;  Surgeon: [name], MD;  Location: [place];  Service: Orthopedics;  Laterality: Right;   NECK SURGERY     novemver 2019   TRANSURETHRAL RESECTION OF PROSTATE N/A 06/28/2020   Procedure: TRANSURETHRAL RESECTION OF THE PROSTATE (TURP);  Surgeon: [name], MD;  Location: [place];  Service: Urology;  Laterality: N/A;   WISDOM TOOTH EXTRACTION     Social History   Tobacco Use   Smoking status: Former    Types: Cigars    Start date: 04/08/1973    Quit date: 12/24/1988    Years since quitting: 32.3   Smokeless tobacco: Never   Tobacco comments:    Cigars and Pipe   Substance Use Topics   Alcohol use: Never    ROS  Review of Systems  Cardiovascular:  Negative for chest pain, dyspnea on exertion and leg swelling.  Gastrointestinal:  Negative for melena.  Objective  Blood pressure 130/64, pulse 60, temperature 98.3 F (36.8 C), temperature source Temporal, resp. rate 17, height 5\' 10"  (1.778 m), weight 215 lb (97.5 kg), SpO2 96 %.  Vitals with BMI 05/04/2021 05/04/2021 03/13/2021  Height 5' 5.1" 5\' 10"  5\' 10"   Weight 215 lbs 215 lbs 210 lbs  BMI 35.65 32.35 57.32  Systolic 202 542 706  Diastolic 60 64 63  Pulse 56 60 65     Physical Exam Constitutional:      Comments: Ill looking  Neck:     Thyroid: No thyromegaly.     Vascular: No carotid bruit or JVD.  Cardiovascular:     Rate and Rhythm: Normal rate and regular rhythm.     Pulses:          Carotid pulses are 2+ on the right side and 2+ on the left side.       Femoral pulses are 2+ on the right side and 2+ on the left side.      Popliteal pulses are 2+ on the right side and 2+ on the left side.       Dorsalis pedis pulses are 1+ on the right side and 1+ on the left side.       Posterior tibial pulses are 1+ on the right side and 1+ on the left side.     Heart sounds: S1 normal and S2 normal. Murmur heard.  Holosystolic murmur is present with a grade of 2/6.  Through out the precordium related to AV shunt left arm.      Comments: No open wounds. Thick rigid nails and onychomycosis (chronic).  Capillary refill < 3 Sec.  Left arm AV fistula for dialysis patent. Pulmonary:     Effort: Pulmonary effort is normal.     Breath sounds: Normal breath sounds.  Abdominal:     General: Bowel sounds are normal.     Palpations: Abdomen is soft.  Musculoskeletal:     Right lower leg: No edema.  Skin:    Capillary Refill: Capillary refill takes less than 2 seconds.   Laboratory examination:   Recent Labs    02/04/21 0111 02/05/21 0218 02/06/21 0635  NA 134* 133* 137  K 4.3 4.4 4.8  CL 97* 98 102  CO2 27 25 23   GLUCOSE 185* 212* 165*  BUN 29* 53* 71*  CREATININE 4.73* 6.16* 6.78*  CALCIUM 8.4* 8.5* 8.8*  GFRNONAA 12* 9* 8*   CrCl cannot be calculated (Patient's most recent lab result is older than the maximum 21 days allowed.).  CMP Latest Ref Rng & Units 02/06/2021 02/05/2021 02/04/2021  Glucose 70 - 99 mg/dL 165(H) 212(H) 185(H)  BUN 8 - 23 mg/dL 71(H) 53(H) 29(H)  Creatinine 0.61 - 1.24 mg/dL 6.78(H) 6.16(H) 4.73(H)  Sodium 135 - 145 mmol/L 137 133(L) 134(L)  Potassium 3.5 - 5.1 mmol/L 4.8 4.4 4.3  Chloride 98 - 111 mmol/L 102 98 97(L)  CO2 22 - 32 mmol/L 23 25 27   Calcium 8.9 - 10.3 mg/dL 8.8(L) 8.5(L) 8.4(L)  Total Protein 6.5 - 8.1 g/dL - - -  Total Bilirubin 0.3 - 1.2 mg/dL - - -  Alkaline Phos 38 - 126 U/L - - -  AST 15 - 41 U/L - - -  ALT 0 - 44 U/L - - -   CBC Latest Ref Rng & Units 05/04/2021 02/06/2021 02/03/2021  WBC 4.0 - 10.5  K/uL 7.5 7.7 12.4(H)  Hemoglobin 13.0 - 17.0 g/dL 10.0(L) 8.6(L) 9.1(L)  Hematocrit 39.0 - 52.0 % 29.2(L) 25.6(L) 27.6(L)  Platelets 150 - 400 K/uL 123(L) 156 205   HEMOGLOBIN A1C Lab Results  Component Value Date   HGBA1C 5.5 05/04/2021   MPG 111.15 05/04/2021    External labs:   Labs 08/25/2019:  Total cholesterol 109, triglycerides 331, HDL 25, LDL 43.  Non-HDL cholesterol 84.  Labs 02/25/2020: A1c 4.7%.  TSH normal.  Labs 02/17/2020:  Hb 10.4/HCT 31.5, platelets 146, normal indicis.  Sodium 139, potassium 4.5, BUN 46, creatinine 5.88.  Total bilirubin 0.69.  Albumin 4.2.  ALT 14.  AST 19.  Medications and allergies   Allergies  Allergen Reactions   Mushroom Extract Complex Nausea Only      Medications after today's encounter Current Outpatient Medications  Medication Instructions   acetaminophen (TYLENOL) 1,000-2,000 mg, Oral, 2 times daily PRN   allopurinol (ZYLOPRIM) 200 mg, Oral, Daily   amoxicillin-clavulanate (AUGMENTIN) 500-125 MG tablet 500 mg, Oral, Daily, Take daily in the evening.   B Complex-C-Zn-Folic Acid (DIALYVITE 299 WITH ZINC) 0.8 MG TABS 1 tablet, Oral, Daily   calcitRIOL (ROCALTROL) 0.5 mcg, Oral, Daily   carvedilol (COREG) 12.5 mg, Oral, 2 times daily   Lancets (ONETOUCH DELICA PLUS MEQAST41D) MISC No dose, route, or frequency recorded.   levothyroxine (SYNTHROID) 112 mcg, Oral, Daily before breakfast   losartan (COZAAR) 100 mg, Oral, Daily   nitroGLYCERIN (NITRODUR - DOSED IN MG/24 HR) 0.2 mg, Transdermal, Daily   oxyCODONE-acetaminophen (PERCOCET/ROXICET) 5-325 MG tablet 1 tablet,  Oral, Every 4 hours PRN   Ozempic (0.25 or 0.5 MG/DOSE) 0.5 mg, Subcutaneous, Every Fri   Polyethyl Glycol-Propyl Glycol (LUBRICANT EYE DROPS) 0.4-0.3 % SOLN 1-2 drops, Both Eyes, 2 times daily   pregabalin (LYRICA) 150 mg, Oral, 2 times daily   prochlorperazine (COMPAZINE) 5 MG tablet TAKE 1 TABLET BY MOUTH TWICE A DAY   rosuvastatin (CRESTOR) 10 mg, Oral, Daily    silver sulfADIAZINE (SILVADENE) 1 % cream APPLY TO AFFECTED AREA EVERY DAY    Radiology:   CXR 02/25/2020: Persistent small bilateral pleural effusions, slightly increased on the left.  Cardiac Studies:   Lower extremity arterial duplex 05/12/2014:  No hemodynamically significant stenoses are identified in the bilateral lower extremity arterial system.This exam reveals normal perfusion of both the lower extremities with RABI 1.04 and LABI 1.09. There is mild diffuse disease involving the small vessels below the knee. Compared to the study done on 06/08/2013, no significant change. Impression: Lower extremity venous insufficiency study 12/25/2016: No venous insufficiency or DVT in the right lower extremity. Reflexes in the left greater saphenous vein beginning in the distal thigh and involving the entire length of the calf. No obvious varicosities.  ABI 12/16/2018: Right: Resting right ankle-brachial index is within normal range. No evidence of significant right lower extremity arterial disease. Left: Resting left ankle-brachial index indicates noncompressible left lower extremity arteries.  Carotid artery duplex  05/07/2019: Stenosis in the bilateral internal carotid artery (16-49%), lower end of spectrum with heteregenous plaque.  Antegrade right vertebral artery flow. Antegrade left vertebral artery flow. Follow up in one year is appropriate if clinically indicated. Compared to 01/27/2018, no significant change.  Echocardiogram 03/17/2020: Mildly depressed LV systolic function with visual EF 40-45%. Left ventricle cavity is normal in size. Moderate concentric hypertrophy of the left ventricle. Left ventricle regional wall motion findings: Mid inferoseptal, Apical septal and Apical cap hypokinesis. Unable to evaluate diastolic function due to atrial fibrillation. Elevated LAP.  Left atrial cavity is severely dilated. Aortic valve sclerosis without stenosis. Mild (Grade I) mitral  regurgitation. Mild tricuspid regurgitation. No evidence of pulmonary hypertension. RVSP measures 33 mmHg. Mild pulmonic regurgitation. IVC is dilated with blunted respiratory response. Compared to prior study dated 06/08/2013: LVEF was 60-65% and now 40-45%, RWMA is new, LA is now severely dilated.   Lexiscan Tetrofosmin Stress Test 03/20/2020: Non-diagnostic ECG stress due to pharmacologic stress testing. Resting EKG demonstrated atrial flutter. Non-specific Twave abnormalities. Peak EKG revealed no significant ST-T change from baseline abnormality. Myocardial perfusion is normal. Mildly enlarged left ventricle. in both rest and stress. LV stress volume 170 ml. TID is normal. No stress lung uptake. Overall LV systolic function is low normal without regional wall motion  abnormalities. Stress LV EF: 51%. Compared to 05/28/2013, no significant change. LV dilatation is new.  EKG  EKG 05/04/2021: Atrial fibrillation with controlled ventricular response at rate of 58 bpm, normal axis, nonspecific T abnormality. No significant change from EKG 11/01/2020 Assessment     ICD-10-CM   1. Coronary artery disease involving native coronary artery of native heart without angina pectoris  I25.10     2. Essential hypertension  I10 EKG 12-Lead    3. ESRD (end stage renal disease) ([place])  N18.6     4. Permanent atrial fibrillation (HCC)  I48.21      CHA2DS2-VASc Score is 4.  Yearly risk of stroke: 4.8% (A, HTN, DM, Vasc Dz).  Score of 1=0.6; 2=2.2; 3=3.2; 4=4.8; 5=7.2; 6=9.8; 7=>9.8) -(CHF; HTN; vasc disease DM,  Male =  1; Age <65 =0; 65-74 = 1,  >75 =2; stroke/embolism= 2).    No orders of the defined types were placed in this encounter.    Medications Discontinued During This Encounter  Medication Reason   famotidine (PEPCID) 20 MG tablet    pantoprazole (PROTONIX) 40 MG tablet    sevelamer carbonate (RENVELA) 800 MG tablet      Recommendations:   Timon Geissinger  is a 71 y.o. Caucasian male with  CABG in [place] 02/18/2004: RIMA to LAD, LIMA to obtuse marginal, SVG to diagonal and SVG to PDA, history of atrial flutter ablation on 04/06/2020 but noted to have atrial fibrillation in July 2022, inability to tolerate anticoagulation due to severe hematuria and unable to tolerate antiplatelet agents due to GI bleed, diabetes mellitus, diabetic nephropathy with ESRD on HD at home, peripheral neuropathy and diabetic foot ulcer right, S/P Sim's amputation of the right foot and now has chronic ulcer, amputation of his left 2 toes due to diabetic foot ulcer in the past, now has been scheduled for left below-knee amputation.  He performs home dialysis, accompanied by his wife.  No change in his physical exam and his EKG.  He is in persistent atrial fibrillation, no attempts will be done to convert him to sinus rhythm, permanent atrial fibrillation. There is no clinical evidence of heart failure.  I had a long discussion with the patient and his wife that quality of life is more precious than quantity, I have tried to give him positive reinforcement that he would actually feel better after the amputation as he has had chronic infection related to diabetes in his left foot.  I did not make any changes to his medications.  At this point conservative management for his CAD, I will be happy to be available if cardiac issues were to arise during hospitalization for amputation.  I will see him back in 6 months for follow-up.  I did not make any changes to his medications.  Lipids are well controlled, blood pressure is well controlled and no clinical evidence of heart failure.    Adrian Prows, MD, [facility] 05/04/2021, 5:16 PM Office: [phone] Pager: [phone]

## 2021-05-04 NOTE — Progress Notes (Signed)
PCP - Lorenda Hatchet FNP Cardiologist - Dr. Adrian Prows  PPM/ICD - denies Device Orders -  Rep Notified -   Chest x-ray - 05/26/20 EKG - 05/04/20 Stress Test - 03/20/20 ECHO - 03/17/20 Cardiac Cath -   Sleep Study -denies  CPAP -   Fasting Blood Sugar - 150's Checks Blood Sugar ___2__ times a week  Blood Thinner Instructions: Aspirin Instructions:  ERAS Protcol -clear liquids until 0530 PRE-SURGERY Ensure or G2-   COVID TEST- scheduled 05/07/21   Anesthesia review:yes-hx a fib,on home hemodialysis   Patient denies shortness of breath, fever, cough and chest pain at PAT appointment   All instructions explained to the patient, with a verbal understanding of the material. Patient agrees to go over the instructions while at home for a better understanding. Patient also instructed to self quarantine after being tested for COVID-19. The opportunity to ask questions was provided.

## 2021-05-07 ENCOUNTER — Other Ambulatory Visit (HOSPITAL_COMMUNITY)
Admission: RE | Admit: 2021-05-07 | Discharge: 2021-05-07 | Disposition: A | Discharge status: Home / Self Care | Payer: Medicare Other | Source: Ambulatory Visit | Attending: Orthopedic Surgery | Admitting: Orthopedic Surgery

## 2021-05-07 DIAGNOSIS — Z20822 Contact with and (suspected) exposure to covid-19: Secondary | ICD-10-CM | POA: Insufficient documentation

## 2021-05-07 DIAGNOSIS — Z01812 Encounter for preprocedural laboratory examination: Secondary | ICD-10-CM | POA: Insufficient documentation

## 2021-05-07 DIAGNOSIS — Z01818 Encounter for other preprocedural examination: Secondary | ICD-10-CM

## 2021-05-07 LAB — SARS CORONAVIRUS 2 (TAT 6-24 HRS): SARS Coronavirus 2: NEGATIVE

## 2021-05-07 NOTE — Anesthesia Preprocedure Evaluation (Addendum)
Anesthesia Evaluation  Patient identified by MRN, date of birth, ID band Patient awake    Reviewed: Allergy & Precautions, NPO status , Patient's Chart, lab work & pertinent test results  Airway Mallampati: III  TM Distance: >3 FB Neck ROM: Full    Dental no notable dental hx.    Pulmonary former smoker,    Pulmonary exam normal breath sounds clear to auscultation       Cardiovascular hypertension, Pt. on medications and Pt. on home beta blockers + CAD, + CABG and + Peripheral Vascular Disease  Normal cardiovascular exam+ dysrhythmias Atrial Fibrillation  Rhythm:Regular Rate:Normal     Neuro/Psych Cervical myelopathy Diabetic neuropathy   Neuromuscular disease negative psych ROS   GI/Hepatic negative GI ROS, Neg liver ROS,   Endo/Other  diabetesHypothyroidism   Renal/GU ESRF and DialysisRenal disease     Musculoskeletal  (+) Arthritis , Gout   Abdominal (+) + obese,   Peds  Hematology negative hematology ROS (+) anemia ,   Anesthesia Other Findings Osteomyelitis Left Foot  Reproductive/Obstetrics                          Anesthesia Physical Anesthesia Plan  ASA: 3  Anesthesia Plan: Regional   Post-op Pain Management:    Induction: Intravenous  PONV Risk Score and Plan: 1 and Ondansetron, Dexamethasone, Propofol infusion and Treatment may vary due to age or medical condition  Airway Management Planned: Simple Face Mask  Additional Equipment:   Intra-op Plan:   Post-operative Plan:   Informed Consent: I have reviewed the patients History and Physical, chart, labs and discussed the procedure including the risks, benefits and alternatives for the proposed anesthesia with the patient or authorized representative who has indicated his/her understanding and acceptance.     Dental advisory given  Plan Discussed with: CRNA  Anesthesia Plan Comments: (Reviewed PAT note written  05/07/2021 by Myra Gianotti, PA-C. 40 ml of ropivicaine 0.5% )      Anesthesia Quick Evaluation

## 2021-05-07 NOTE — Progress Notes (Signed)
Anesthesia Chart Review:  Case: [id number] Date/Time: 05/09/21 0815   Procedure: LEFT BELOW KNEE AMPUTATION (Left: Knee)   Anesthesia type: Choice   Pre-op diagnosis: Osteomyelitis Left Foot   Location: [facility] OR ROOM 03 / [place] OR   Surgeons: [name], MD       DISCUSSION: Patient is a 71 year old male scheduled for the above procedure.  History includes former smoker (quit 12/24/88), post-operative N/V, CAD (s/p CABG  in [place] 02/18/04: "RIMA to LAD, LIMA to obtuse marginal, SVG to diagonal and SVG to PDA"), afib/flutter (03/08/21, s/p aflutter ablation 03/3020; noted to be in afib July 2022, unable to tolerate anticoagulation due to severe hematuria & prolonged bleeding with antiplatelets), HTN, hypercholesterolemia, hypothyroidism, DM2 (with neuropathy, nephropathy), ESRD (left basilic AVF ~ 5573; home hemodialysis Su-Mo-Wed-Thur since 05/2019), GERD, PVD (left 5th ray amputation 12/25/13; right 4th-5th toes amputations 12/15/18; right partial cuboid excision 08/27/19, excision of right foot infected bone 01/07/20; left 4th ray amputation 02/02/21), prostate cancer (with radiation-induced hemorrhagic cystitis, s/p clot evacuation of bladder, fulguration of ladder, meatal dilation 06/17/20, 06/28/20 [with TURP], & 06/29/20), spinal surgery. Normal EGD (as part of nausea work-up) 05/11/20. +COVID-19 tests 09/13/20 & 02/02/21.  Last cardiology visit with Dr. Einar Gip was on 05/04/21.  Last stress and echo 03/2020 (see CV below). He notes plans for left BKA.  He remains in rate controlled persistent atrial fibrillation.  There was no clinical evidence of heart failure.  Conservative management for CAD recommended.  He added, "I will be happy to be available if cardiac issues were to arise during hospitalization for amputation." Otherwise six month follow-up planned.   05/07/21 COVID-19 test negative. He has ESRD, so will get some day of surgery labs. Anesthesia team to evaluate on the day of surgery.    VS: BP 127/60     Pulse (!) 56    Temp 36.9 C (Oral)    Resp 18    Ht 5' 5.1" (1.654 m)    Wt 97.5 kg    SpO2 100%    BMI 35.67 kg/m    PROVIDERS: Lorenda Hatchet, FNP is PCP  Adrian Prows, MD is cardiologist [name], MD is EP cardiologist Madelon Lips, MD is nephrologist Jacolyn Reedy, [place] is endocrinology provider ([facility]) [name], MD is urologist   LABS: CMP and CBC with differential are ordered by surgery. Surgeon orders not available at 05/04/21 PAT visit. On 05/05/11, CBC showed H/H 10.0/29.2, PLT 123. A1c 5.5%. (all labs ordered are listed, but only abnormal results are displayed)  Labs Reviewed  GLUCOSE, CAPILLARY - Abnormal; Notable for the following components:      Result Value   Glucose-Capillary 126 (*)    All other components within normal limits     IMAGES: CXR 05/26/20: FINDINGS: The heart size and mediastinal contours are within normal limits. Both lungs are clear. Sternotomy wires are noted. No pneumothorax or pleural effusion is noted. The visualized skeletal structures are unremarkable. IMPRESSION: No active cardiopulmonary disease.    EKG: EKG 05/04/2021: Atrial fibrillation with controlled ventricular response at rate of 58 bpm, normal axis, nonspecific T abnormality. ABNORMAL    CV: EP Study 04/06/2020: CONCLUSIONS:  1. Isthmus-dependent right atrial flutter upon presentation.  2. Successful radiofrequency ablation of atrial flutter along the cavotricuspid isthmus with complete bidirectional isthmus block achieved.  3. No inducible arrhythmias following ablation.  4. No early apparent complications.    Lexiscan Tetrofosmin Stress Test 03/20/2020: Non-diagnostic ECG stress due to pharmacologic stress testing. Resting  EKG demonstrated atrial flutter. Non-specific Twave abnormalities. Peak EKG revealed no significant ST-T change from baseline abnormality. Myocardial perfusion is normal. Mildly enlarged left ventricle. in both rest and  stress. LV stress volume 170 ml. TID is normal. No stress lung uptake. Overall LV systolic function is low normal without regional wall motion  abnormalities. Stress LV EF: 51%. Compared to 05/28/2013, no significant change. LV dilatation is new.   Echocardiogram 03/17/2020:  Mildly depressed LV systolic function with visual EF 40-45%. Left  ventricle cavity is normal in size. Moderate concentric hypertrophy of the  left ventricle. Left ventricle regional wall motion findings: Mid  inferoseptal, Apical septal and Apical cap hypokinesis. Unable to evaluate  diastolic function due to atrial fibrillation. Elevated LAP.  Left atrial cavity is severely dilated.  Aortic valve sclerosis without stenosis.  Mild (Grade I) mitral regurgitation.  Mild tricuspid regurgitation. No evidence of pulmonary hypertension. RVSP  measures 33 mmHg.  Mild pulmonic regurgitation.  IVC is dilated with blunted respiratory response.  Compared to prior study dated 06/08/2013: LVEF was 60-65% and now 40-45%,  RWMA is new, LA is now severely dilated.    Carotid artery duplex  05/07/2019:  Stenosis in the bilateral internal carotid artery (16-49%), lower end of  spectrum with heteregenous plaque.  Antegrade right vertebral artery flow. Antegrade left vertebral artery flow.  Follow up in one year is appropriate if clinically indicated. Compared to 01/27/2018, no significant change.    Past Medical History:  Diagnosis Date   Ambulates with cane    straight cane   Cervical myelopathy ([place]) 02/06/2018   Chronic kidney disease    dailysis M W F- home   Complication of anesthesia    Coronary artery disease    Diabetes mellitus without complication ([place])    type2   Diabetic foot ulcer ([place])    Diabetic neuropathy ([place]) 02/06/2018   Gait abnormality 08/24/2018   GERD (gastroesophageal reflux disease)     01/06/20- not current   Gout    History of blood transfusion    History of kidney stones    passed stones    Hypercholesteremia    Hypertension    Hypothyroidism    Neuromuscular disorder (HCC)    neuropathy left leg and bilateral feet   Neuropathy    PONV (postoperative nausea and vomiting)    Prostate cancer (HCC)    PVD (peripheral vascular disease) ([place])    with amputations    Past Surgical History:  Procedure Laterality Date   A-FLUTTER ABLATION N/A 04/06/2020   Procedure: A-FLUTTER ABLATION;  Surgeon: [name], MD;  Location: [place] [facility];  Service: Cardiovascular;  Laterality: N/A;   AMPUTATION Left 12/25/2013   Procedure: AMPUTATION RAY LEFT 5TH RAY;  Surgeon: [name], MD;  Location: [facility];  Service: Orthopedics;  Laterality: Left;   AMPUTATION Right 12/15/2018   Procedure: AMPUTATION OF 4TH AND 5TH TOES RIGHT FOOT;  Surgeon: [name], MD;  Location: [place];  Service: Orthopedics;  Laterality: Right;   AMPUTATION Left 02/02/2021   Procedure: LEFT FOOT 4TH RAY AMPUTATION;  Surgeon: [name], MD;  Location: [name];  Service: Orthopedics;  Laterality: Left;   APPLICATION OF WOUND VAC Right 12/15/2018   Procedure: APPLICATION OF WOUND VAC;  Surgeon: [name], MD;  Location: [place];  Service: Orthopedics;  Laterality: Right;   AV FISTULA PLACEMENT Left 03/25/2019   Procedure: LEFT ARM ARTERIOVENOUS (AV) FISTULA CREATION;  Surgeon: [name], MD;  Location: [place] [facility];  Service: Vascular;  Laterality: Left;   BACK SURGERY     BASCILIC VEIN TRANSPOSITION Left 05/20/2019   Procedure: SECOND STAGE LEFT BASCILIC VEIN TRANSPOSITION;  Surgeon: [name], MD;  Location: [place] [facility];  Service: Vascular;  Laterality: Left;   CARDIAC CATHETERIZATION  02/17/2014   CHOLECYSTECTOMY     COLONOSCOPY  2011   in [place]  2008   CYSTOSCOPY N/A 06/29/2020   Procedure: [place];  Surgeon: [name], MD;  Location: [place];  Service: Urology;  Laterality: N/A;   CYSTOSCOPY WITH  FULGERATION Bilateral 06/17/2020   Procedure: CYSTOSCOPY,BILATERAL RETROGRADE, CLOT EVACUATION WITH FULGERATION OF THE BLADDER;  Surgeon: Janith Lima, MD;  Location: [place];  Service: Urology;  Laterality: Bilateral;   CYSTOSCOPY WITH FULGERATION N/A 06/28/2020   Procedure: CYSTOSCOPY WITH CLOT EVACUATION AND FULGERATION OF BLEEDERS;  Surgeon: [name], MD;  Location: [facility];  Service: Urology;  Laterality: N/A;  1 HR   I & D EXTREMITY Right 12/15/2018   Procedure: DEBRIDEMENT RIGHT FOOT;  Surgeon: [name], MD;  Location: [place];  Service: Orthopedics;  Laterality: Right;   I & D EXTREMITY Right 08/27/2019   Procedure: PARTIAL CUBOID EXCISION RIGHT FOOT;  Surgeon: [name], MD;  Location: [place];  Service: Orthopedics;  Laterality: Right;   I & D EXTREMITY Right 01/07/2020   Procedure: RIGHT FOOT EXCISION INFECTED BONE;  Surgeon: [name], MD;  Location: [place];  Service: Orthopedics;  Laterality: Right;   NECK SURGERY     novemver 2019   TRANSURETHRAL RESECTION OF PROSTATE N/A 06/28/2020   Procedure: TRANSURETHRAL RESECTION OF THE PROSTATE (TURP);  Surgeon: [name], MD;  Location: [place];  Service: Urology;  Laterality: N/A;   WISDOM TOOTH EXTRACTION      MEDICATIONS:  acetaminophen (TYLENOL) 500 MG tablet   allopurinol (ZYLOPRIM) 100 MG tablet   amoxicillin-clavulanate (AUGMENTIN) 500-125 MG tablet   B Complex-C-Zn-Folic Acid (DIALYVITE 970 WITH ZINC) 0.8 MG TABS   calcitRIOL (ROCALTROL) 0.5 MCG capsule   carvedilol (COREG) 12.5 MG tablet   Lancets (ONETOUCH DELICA PLUS [id number]) MISC   levothyroxine (SYNTHROID, LEVOTHROID) 112 MCG tablet   losartan (COZAAR) 100 MG tablet   nitroGLYCERIN (NITRODUR - DOSED IN MG/24 HR) 0.2 mg/hr patch   oxyCODONE-acetaminophen (PERCOCET/ROXICET) 5-325 MG tablet   Polyethyl Glycol-Propyl Glycol (LUBRICANT EYE DROPS) 0.4-0.3 % SOLN   pregabalin (LYRICA) 150 MG capsule   prochlorperazine (COMPAZINE) 5 MG tablet    rosuvastatin (CRESTOR) 10 MG tablet   Semaglutide,0.25 or 0.5MG /DOS, (OZEMPIC, 0.25 OR 0.5 MG/DOSE,) 2 MG/1.5ML SOPN   silver sulfADIAZINE (SILVADENE) 1 % cream    0.9 %  sodium chloride infusion    Myra Gianotti, PA-C Surgical Short Stay/Anesthesiology [facility] Phone [phone] [facility] Phone [phone] 05/07/2021 4:10 PM

## 2021-05-09 ENCOUNTER — Inpatient Hospital Stay (HOSPITAL_COMMUNITY)
Admission: RE | Admit: 2021-05-09 | Discharge: 2021-05-11 | DRG: 617 | Disposition: A | Discharge status: Rehabilitation Facility | Payer: Medicare Other | Source: Ambulatory Visit | Attending: Orthopedic Surgery | Admitting: Orthopedic Surgery

## 2021-05-09 ENCOUNTER — Inpatient Hospital Stay (HOSPITAL_COMMUNITY): Payer: Medicare Other | Admitting: Anesthesiology

## 2021-05-09 ENCOUNTER — Encounter (HOSPITAL_COMMUNITY): Payer: Self-pay | Admitting: Orthopedic Surgery

## 2021-05-09 ENCOUNTER — Inpatient Hospital Stay (HOSPITAL_COMMUNITY): Payer: Medicare Other | Admitting: Vascular Surgery

## 2021-05-09 ENCOUNTER — Other Ambulatory Visit: Payer: Self-pay

## 2021-05-09 ENCOUNTER — Encounter (HOSPITAL_COMMUNITY)
Admission: RE | Disposition: A | Discharge status: Still In Hospital | Payer: Self-pay | Source: Ambulatory Visit | Attending: Orthopedic Surgery

## 2021-05-09 DIAGNOSIS — Z794 Long term (current) use of insulin: Secondary | ICD-10-CM

## 2021-05-09 DIAGNOSIS — E7800 Pure hypercholesterolemia, unspecified: Secondary | ICD-10-CM | POA: Diagnosis present

## 2021-05-09 DIAGNOSIS — M86172 Other acute osteomyelitis, left ankle and foot: Secondary | ICD-10-CM | POA: Diagnosis not present

## 2021-05-09 DIAGNOSIS — E1169 Type 2 diabetes mellitus with other specified complication: Secondary | ICD-10-CM | POA: Diagnosis present

## 2021-05-09 DIAGNOSIS — Z833 Family history of diabetes mellitus: Secondary | ICD-10-CM | POA: Diagnosis not present

## 2021-05-09 DIAGNOSIS — Z87442 Personal history of urinary calculi: Secondary | ICD-10-CM

## 2021-05-09 DIAGNOSIS — E1142 Type 2 diabetes mellitus with diabetic polyneuropathy: Secondary | ICD-10-CM | POA: Diagnosis present

## 2021-05-09 DIAGNOSIS — Z89512 Acquired absence of left leg below knee: Secondary | ICD-10-CM | POA: Diagnosis not present

## 2021-05-09 DIAGNOSIS — E11621 Type 2 diabetes mellitus with foot ulcer: Secondary | ICD-10-CM | POA: Diagnosis present

## 2021-05-09 DIAGNOSIS — Z87891 Personal history of nicotine dependence: Secondary | ICD-10-CM

## 2021-05-09 DIAGNOSIS — L97509 Non-pressure chronic ulcer of other part of unspecified foot with unspecified severity: Secondary | ICD-10-CM | POA: Diagnosis not present

## 2021-05-09 DIAGNOSIS — Z4781 Encounter for orthopedic aftercare following surgical amputation: Secondary | ICD-10-CM | POA: Diagnosis present

## 2021-05-09 DIAGNOSIS — N186 End stage renal disease: Secondary | ICD-10-CM | POA: Diagnosis present

## 2021-05-09 DIAGNOSIS — Z8546 Personal history of malignant neoplasm of prostate: Secondary | ICD-10-CM | POA: Diagnosis not present

## 2021-05-09 DIAGNOSIS — I2510 Atherosclerotic heart disease of native coronary artery without angina pectoris: Secondary | ICD-10-CM | POA: Diagnosis present

## 2021-05-09 DIAGNOSIS — E669 Obesity, unspecified: Secondary | ICD-10-CM | POA: Diagnosis present

## 2021-05-09 DIAGNOSIS — Z992 Dependence on renal dialysis: Secondary | ICD-10-CM | POA: Diagnosis not present

## 2021-05-09 DIAGNOSIS — Z79899 Other long term (current) drug therapy: Secondary | ICD-10-CM | POA: Diagnosis not present

## 2021-05-09 DIAGNOSIS — Z951 Presence of aortocoronary bypass graft: Secondary | ICD-10-CM

## 2021-05-09 DIAGNOSIS — Z841 Family history of disorders of kidney and ureter: Secondary | ICD-10-CM

## 2021-05-09 DIAGNOSIS — I120 Hypertensive chronic kidney disease with stage 5 chronic kidney disease or end stage renal disease: Secondary | ICD-10-CM | POA: Diagnosis present

## 2021-05-09 DIAGNOSIS — E1122 Type 2 diabetes mellitus with diabetic chronic kidney disease: Secondary | ICD-10-CM | POA: Diagnosis present

## 2021-05-09 DIAGNOSIS — D6489 Other specified anemias: Secondary | ICD-10-CM | POA: Diagnosis present

## 2021-05-09 DIAGNOSIS — M109 Gout, unspecified: Secondary | ICD-10-CM | POA: Diagnosis present

## 2021-05-09 DIAGNOSIS — Z6829 Body mass index (BMI) 29.0-29.9, adult: Secondary | ICD-10-CM | POA: Diagnosis not present

## 2021-05-09 DIAGNOSIS — N2581 Secondary hyperparathyroidism of renal origin: Secondary | ICD-10-CM | POA: Diagnosis present

## 2021-05-09 DIAGNOSIS — Z8249 Family history of ischemic heart disease and other diseases of the circulatory system: Secondary | ICD-10-CM | POA: Diagnosis not present

## 2021-05-09 DIAGNOSIS — E871 Hypo-osmolality and hyponatremia: Secondary | ICD-10-CM | POA: Diagnosis present

## 2021-05-09 DIAGNOSIS — M869 Osteomyelitis, unspecified: Secondary | ICD-10-CM | POA: Diagnosis present

## 2021-05-09 DIAGNOSIS — E039 Hypothyroidism, unspecified: Secondary | ICD-10-CM | POA: Diagnosis present

## 2021-05-09 DIAGNOSIS — Z800 Family history of malignant neoplasm of digestive organs: Secondary | ICD-10-CM

## 2021-05-09 DIAGNOSIS — M898X9 Other specified disorders of bone, unspecified site: Secondary | ICD-10-CM | POA: Diagnosis present

## 2021-05-09 DIAGNOSIS — E1151 Type 2 diabetes mellitus with diabetic peripheral angiopathy without gangrene: Secondary | ICD-10-CM | POA: Diagnosis present

## 2021-05-09 DIAGNOSIS — L97526 Non-pressure chronic ulcer of other part of left foot with bone involvement without evidence of necrosis: Secondary | ICD-10-CM | POA: Diagnosis present

## 2021-05-09 DIAGNOSIS — Z79890 Hormone replacement therapy (postmenopausal): Secondary | ICD-10-CM

## 2021-05-09 DIAGNOSIS — K219 Gastro-esophageal reflux disease without esophagitis: Secondary | ICD-10-CM | POA: Diagnosis present

## 2021-05-09 DIAGNOSIS — D631 Anemia in chronic kidney disease: Secondary | ICD-10-CM | POA: Diagnosis present

## 2021-05-09 DIAGNOSIS — Z89421 Acquired absence of other right toe(s): Secondary | ICD-10-CM

## 2021-05-09 DIAGNOSIS — Z9079 Acquired absence of other genital organ(s): Secondary | ICD-10-CM | POA: Diagnosis not present

## 2021-05-09 DIAGNOSIS — Z20822 Contact with and (suspected) exposure to covid-19: Secondary | ICD-10-CM | POA: Diagnosis present

## 2021-05-09 DIAGNOSIS — L89322 Pressure ulcer of left buttock, stage 2: Secondary | ICD-10-CM | POA: Diagnosis present

## 2021-05-09 DIAGNOSIS — K5909 Other constipation: Secondary | ICD-10-CM | POA: Diagnosis present

## 2021-05-09 DIAGNOSIS — Z7985 Long-term (current) use of injectable non-insulin antidiabetic drugs: Secondary | ICD-10-CM

## 2021-05-09 HISTORY — PX: AMPUTATION: SHX166

## 2021-05-09 LAB — GLUCOSE, CAPILLARY
Glucose-Capillary: 163 mg/dL — ABNORMAL HIGH (ref 70–99)
Glucose-Capillary: 152 mg/dL — ABNORMAL HIGH (ref 70–99)
Glucose-Capillary: 120 mg/dL — ABNORMAL HIGH (ref 70–99)
Glucose-Capillary: 133 mg/dL — ABNORMAL HIGH (ref 70–99)
Glucose-Capillary: 146 mg/dL — ABNORMAL HIGH (ref 70–99)

## 2021-05-09 LAB — CBC WITH DIFFERENTIAL/PLATELET
Abs Immature Granulocytes: 0.16 10*3/uL — ABNORMAL HIGH (ref 0.00–0.07)
Basophils Absolute: 0 10*3/uL (ref 0.0–0.1)
Basophils Relative: 1 %
Eosinophils Absolute: 0.2 10*3/uL (ref 0.0–0.5)
Eosinophils Relative: 3 %
HCT: 28.3 % — ABNORMAL LOW (ref 39.0–52.0)
Hemoglobin: 9.6 g/dL — ABNORMAL LOW (ref 13.0–17.0)
Immature Granulocytes: 3 %
Lymphocytes Relative: 8 %
Lymphs Abs: 0.4 10*3/uL — ABNORMAL LOW (ref 0.7–4.0)
MCH: 30.8 pg (ref 26.0–34.0)
MCHC: 33.9 g/dL (ref 30.0–36.0)
MCV: 90.7 fL (ref 80.0–100.0)
Monocytes Absolute: 0.5 10*3/uL (ref 0.1–1.0)
Monocytes Relative: 10 %
Neutro Abs: 3.8 10*3/uL (ref 1.7–7.7)
Neutrophils Relative %: 75 %
Platelets: 100 10*3/uL — ABNORMAL LOW (ref 150–400)
RBC: 3.12 MIL/uL — ABNORMAL LOW (ref 4.22–5.81)
RDW: 16.1 % — ABNORMAL HIGH (ref 11.5–15.5)
WBC: 5 10*3/uL (ref 4.0–10.5)
nRBC: 0 % (ref 0.0–0.2)

## 2021-05-09 LAB — POCT I-STAT, CHEM 8
BUN: 26 mg/dL — ABNORMAL HIGH (ref 8–23)
Calcium, Ion: 1.12 mmol/L — ABNORMAL LOW (ref 1.15–1.40)
Chloride: 95 mmol/L — ABNORMAL LOW (ref 98–111)
Creatinine, Ser: 4.3 mg/dL — ABNORMAL HIGH (ref 0.61–1.24)
Glucose, Bld: 139 mg/dL — ABNORMAL HIGH (ref 70–99)
HCT: 28 % — ABNORMAL LOW (ref 39.0–52.0)
Hemoglobin: 9.5 g/dL — ABNORMAL LOW (ref 13.0–17.0)
Potassium: 4 mmol/L (ref 3.5–5.1)
Sodium: 134 mmol/L — ABNORMAL LOW (ref 135–145)
TCO2: 28 mmol/L (ref 22–32)

## 2021-05-09 LAB — HEPATITIS B SURFACE ANTIGEN: Hepatitis B Surface Ag: NONREACTIVE

## 2021-05-09 LAB — COMPREHENSIVE METABOLIC PANEL
ALT: 18 U/L (ref 0–44)
AST: 21 U/L (ref 15–41)
Albumin: 4 g/dL (ref 3.5–5.0)
Alkaline Phosphatase: 73 U/L (ref 38–126)
Anion gap: 11 (ref 5–15)
BUN: 27 mg/dL — ABNORMAL HIGH (ref 8–23)
CO2: 27 mmol/L (ref 22–32)
Calcium: 8.9 mg/dL (ref 8.9–10.3)
Chloride: 96 mmol/L — ABNORMAL LOW (ref 98–111)
Creatinine, Ser: 4.14 mg/dL — ABNORMAL HIGH (ref 0.61–1.24)
GFR, Estimated: 15 mL/min — ABNORMAL LOW (ref >60)
Glucose, Bld: 144 mg/dL — ABNORMAL HIGH (ref 70–99)
Potassium: 4 mmol/L (ref 3.5–5.1)
Sodium: 134 mmol/L — ABNORMAL LOW (ref 135–145)
Total Bilirubin: 0.9 mg/dL (ref 0.3–1.2)
Total Protein: 6.4 g/dL — ABNORMAL LOW (ref 6.5–8.1)

## 2021-05-09 LAB — HEPATITIS B SURFACE ANTIBODY,QUALITATIVE: Hep B S Ab: REACTIVE — AB

## 2021-05-09 LAB — VITAMIN D 25 HYDROXY (VIT D DEFICIENCY, FRACTURES): Vit D, 25-Hydroxy: 16.13 ng/mL — ABNORMAL LOW (ref 30–100)

## 2021-05-09 LAB — MAGNESIUM: Magnesium: 1.9 mg/dL (ref 1.7–2.4)

## 2021-05-09 LAB — PREALBUMIN: Prealbumin: 32.7 mg/dL (ref 18–38)

## 2021-05-09 SURGERY — AMPUTATION BELOW KNEE
Anesthesia: Regional | Site: Knee | Laterality: Left

## 2021-05-09 MED ORDER — TRANEXAMIC ACID-NACL 1000-0.7 MG/100ML-% IV SOLN
1000.0000 mg | INTRAVENOUS | Status: AC
  Administered 2021-05-09: 1000 mg via INTRAVENOUS
  Filled 2021-05-09: qty 100

## 2021-05-09 MED ORDER — 0.9 % SODIUM CHLORIDE (POUR BTL) OPTIME
TOPICAL | Status: DC | PRN
  Administered 2021-05-09: 1000 mL

## 2021-05-09 MED ORDER — CALCITRIOL 0.5 MCG PO CAPS
0.5000 ug | ORAL_CAPSULE | Freq: Every day | ORAL | Status: DC
  Administered 2021-05-09 – 2021-05-11 (×3): 0.5 ug via ORAL
  Filled 2021-05-09 (×3): qty 1

## 2021-05-09 MED ORDER — ONDANSETRON HCL 4 MG/2ML IJ SOLN
INTRAMUSCULAR | Status: DC | PRN
  Administered 2021-05-09: 4 mg via INTRAVENOUS

## 2021-05-09 MED ORDER — PHENYLEPHRINE 40 MCG/ML (10ML) SYRINGE FOR IV PUSH (FOR BLOOD PRESSURE SUPPORT)
PREFILLED_SYRINGE | INTRAVENOUS | Status: AC
  Filled 2021-05-09: qty 10

## 2021-05-09 MED ORDER — ORAL CARE MOUTH RINSE: 15.0000 mL | Freq: Once | OROMUCOSAL | Status: AC

## 2021-05-09 MED ORDER — POLYVINYL ALCOHOL 1.4 % OP SOLN
1.0000 [drp] | Freq: Every day | OPHTHALMIC | Status: DC
  Administered 2021-05-10 – 2021-05-11 (×2): 1 [drp] via OPHTHALMIC
  Filled 2021-05-09: qty 15

## 2021-05-09 MED ORDER — LOSARTAN POTASSIUM 50 MG PO TABS
100.0000 mg | ORAL_TABLET | Freq: Every day | ORAL | Status: DC
  Administered 2021-05-10 – 2021-05-11 (×2): 100 mg via ORAL
  Filled 2021-05-09 (×2): qty 2

## 2021-05-09 MED ORDER — PANTOPRAZOLE SODIUM 40 MG PO TBEC
40.0000 mg | DELAYED_RELEASE_TABLET | Freq: Every day | ORAL | Status: DC
  Administered 2021-05-09 – 2021-05-11 (×3): 40 mg via ORAL
  Filled 2021-05-09 (×3): qty 1

## 2021-05-09 MED ORDER — PROPOFOL 500 MG/50ML IV EMUL
INTRAVENOUS | Status: DC | PRN
  Administered 2021-05-09: 100 ug/kg/min via INTRAVENOUS

## 2021-05-09 MED ORDER — PROPOFOL 1000 MG/100ML IV EMUL
INTRAVENOUS | Status: AC
  Filled 2021-05-09: qty 100

## 2021-05-09 MED ORDER — LABETALOL HCL 5 MG/ML IV SOLN: 10.0000 mg | INTRAVENOUS | Status: DC | PRN

## 2021-05-09 MED ORDER — FENTANYL CITRATE (PF) 250 MCG/5ML IJ SOLN
INTRAMUSCULAR | Status: AC
  Filled 2021-05-09: qty 5

## 2021-05-09 MED ORDER — JUVEN PO PACK
1.0000 | PACK | Freq: Two times a day (BID) | ORAL | Status: DC
  Administered 2021-05-09 – 2021-05-10 (×4): 1 via ORAL
  Filled 2021-05-09 (×5): qty 1

## 2021-05-09 MED ORDER — SEMAGLUTIDE(0.25 OR 0.5MG/DOS) 2 MG/1.5ML ~~LOC~~ SOPN: 0.5000 mg | PEN_INJECTOR | SUBCUTANEOUS | Status: DC

## 2021-05-09 MED ORDER — PHENOL 1.4 % MT LIQD: 1.0000 | OROMUCOSAL | Status: DC | PRN

## 2021-05-09 MED ORDER — PROPOFOL 10 MG/ML IV BOLUS
INTRAVENOUS | Status: AC
  Filled 2021-05-09: qty 20

## 2021-05-09 MED ORDER — KETAMINE HCL 10 MG/ML IJ SOLN
INTRAMUSCULAR | Status: DC | PRN
  Administered 2021-05-09 (×6): 5 mg via INTRAVENOUS

## 2021-05-09 MED ORDER — MAGNESIUM SULFATE 2 GM/50ML IV SOLN: 2.0000 g | Freq: Every day | INTRAVENOUS | Status: DC | PRN

## 2021-05-09 MED ORDER — METOPROLOL TARTRATE 5 MG/5ML IV SOLN: 2.0000 mg | INTRAVENOUS | Status: DC | PRN

## 2021-05-09 MED ORDER — CEFAZOLIN SODIUM-DEXTROSE 2-4 GM/100ML-% IV SOLN
2.0000 g | INTRAVENOUS | Status: AC
  Administered 2021-05-09: 2 g via INTRAVENOUS
  Filled 2021-05-09: qty 100

## 2021-05-09 MED ORDER — LEVOTHYROXINE SODIUM 112 MCG PO TABS
112.0000 ug | ORAL_TABLET | Freq: Every day | ORAL | Status: DC
  Administered 2021-05-10 – 2021-05-11 (×2): 112 ug via ORAL
  Filled 2021-05-09 (×2): qty 1

## 2021-05-09 MED ORDER — ONDANSETRON HCL 4 MG/2ML IJ SOLN: 4.0000 mg | Freq: Once | INTRAMUSCULAR | Status: DC | PRN

## 2021-05-09 MED ORDER — ACETAMINOPHEN 500 MG PO TABS
1000.0000 mg | ORAL_TABLET | Freq: Once | ORAL | Status: AC
  Administered 2021-05-09: 1000 mg via ORAL
  Filled 2021-05-09: qty 2

## 2021-05-09 MED ORDER — ROPIVACAINE HCL 5 MG/ML IJ SOLN
INTRAMUSCULAR | Status: DC | PRN
  Administered 2021-05-09: 10 mL via PERINEURAL
  Administered 2021-05-09: 30 mL via PERINEURAL

## 2021-05-09 MED ORDER — KETAMINE HCL 50 MG/5ML IJ SOSY
PREFILLED_SYRINGE | INTRAMUSCULAR | Status: AC
  Filled 2021-05-09: qty 5

## 2021-05-09 MED ORDER — SODIUM CHLORIDE 0.9 % IV SOLN: INTRAVENOUS | Status: DC

## 2021-05-09 MED ORDER — HYDROMORPHONE HCL 1 MG/ML IJ SOLN
0.5000 mg | INTRAMUSCULAR | Status: DC | PRN
  Administered 2021-05-09 – 2021-05-11 (×4): 0.5 mg via INTRAVENOUS
  Filled 2021-05-09 (×4): qty 0.5

## 2021-05-09 MED ORDER — OXYCODONE HCL 5 MG PO TABS
10.0000 mg | ORAL_TABLET | ORAL | Status: DC | PRN
  Administered 2021-05-09 – 2021-05-11 (×5): 10 mg via ORAL
  Filled 2021-05-09 (×5): qty 2

## 2021-05-09 MED ORDER — ASCORBIC ACID 500 MG PO TABS
1000.0000 mg | ORAL_TABLET | Freq: Every day | ORAL | Status: DC
  Administered 2021-05-09 – 2021-05-11 (×3): 1000 mg via ORAL
  Filled 2021-05-09 (×3): qty 2

## 2021-05-09 MED ORDER — PREGABALIN 75 MG PO CAPS
150.0000 mg | ORAL_CAPSULE | Freq: Every day | ORAL | Status: DC
Start: 2021-05-09 — End: 2021-05-11
  Administered 2021-05-10 – 2021-05-11 (×2): 150 mg via ORAL
  Filled 2021-05-09 (×3): qty 2

## 2021-05-09 MED ORDER — ZINC SULFATE 220 (50 ZN) MG PO CAPS
220.0000 mg | ORAL_CAPSULE | Freq: Every day | ORAL | Status: DC
  Administered 2021-05-09 – 2021-05-11 (×3): 220 mg via ORAL
  Filled 2021-05-09 (×3): qty 1

## 2021-05-09 MED ORDER — PREGABALIN 100 MG PO CAPS
300.0000 mg | ORAL_CAPSULE | Freq: Every day | ORAL | Status: DC
  Administered 2021-05-09 – 2021-05-10 (×2): 300 mg via ORAL
  Filled 2021-05-09 (×2): qty 3

## 2021-05-09 MED ORDER — ONDANSETRON HCL 4 MG/2ML IJ SOLN
INTRAMUSCULAR | Status: AC
  Filled 2021-05-09: qty 2

## 2021-05-09 MED ORDER — CARVEDILOL 12.5 MG PO TABS
12.5000 mg | ORAL_TABLET | Freq: Two times a day (BID) | ORAL | Status: DC
  Administered 2021-05-09 – 2021-05-11 (×3): 12.5 mg via ORAL
  Filled 2021-05-09 (×3): qty 1

## 2021-05-09 MED ORDER — FENTANYL CITRATE (PF) 100 MCG/2ML IJ SOLN: 25.0000 ug | INTRAMUSCULAR | Status: DC | PRN

## 2021-05-09 MED ORDER — ROSUVASTATIN CALCIUM 5 MG PO TABS
10.0000 mg | ORAL_TABLET | Freq: Every day | ORAL | Status: DC
  Administered 2021-05-10 – 2021-05-11 (×2): 10 mg via ORAL
  Filled 2021-05-09 (×2): qty 2

## 2021-05-09 MED ORDER — ALLOPURINOL 100 MG PO TABS
200.0000 mg | ORAL_TABLET | Freq: Every day | ORAL | Status: DC
  Administered 2021-05-10 – 2021-05-11 (×2): 200 mg via ORAL
  Filled 2021-05-09 (×2): qty 2

## 2021-05-09 MED ORDER — ALUM & MAG HYDROXIDE-SIMETH 200-200-20 MG/5ML PO SUSP: 15.0000 mL | ORAL | Status: DC | PRN

## 2021-05-09 MED ORDER — ONDANSETRON HCL 4 MG/2ML IJ SOLN: 4.0000 mg | Freq: Four times a day (QID) | INTRAMUSCULAR | Status: DC | PRN

## 2021-05-09 MED ORDER — CHLORHEXIDINE GLUCONATE 0.12 % MT SOLN
15.0000 mL | Freq: Once | OROMUCOSAL | Status: AC
  Administered 2021-05-09: 15 mL via OROMUCOSAL
  Filled 2021-05-09: qty 15

## 2021-05-09 MED ORDER — MAGNESIUM CITRATE PO SOLN: 1.0000 | Freq: Once | ORAL | Status: DC | PRN

## 2021-05-09 MED ORDER — CHLORHEXIDINE GLUCONATE CLOTH 2 % EX PADS: 6.0000 | MEDICATED_PAD | Freq: Every day | CUTANEOUS | Status: DC

## 2021-05-09 MED ORDER — BISACODYL 5 MG PO TBEC: 5.0000 mg | DELAYED_RELEASE_TABLET | Freq: Every day | ORAL | Status: DC | PRN

## 2021-05-09 MED ORDER — POTASSIUM CHLORIDE CRYS ER 20 MEQ PO TBCR: 20.0000 meq | EXTENDED_RELEASE_TABLET | Freq: Every day | ORAL | Status: DC | PRN

## 2021-05-09 MED ORDER — CEFAZOLIN SODIUM-DEXTROSE 2-4 GM/100ML-% IV SOLN
2.0000 g | Freq: Once | INTRAVENOUS | Status: AC
Start: 2021-05-09 — End: 2021-05-09
  Administered 2021-05-09: 2 g via INTRAVENOUS
  Filled 2021-05-09: qty 100

## 2021-05-09 MED ORDER — HYDRALAZINE HCL 20 MG/ML IJ SOLN: 5.0000 mg | INTRAMUSCULAR | Status: DC | PRN

## 2021-05-09 MED ORDER — POLYETHYL GLYCOL-PROPYL GLYCOL 0.4-0.3 % OP SOLN: 1.0000 [drp] | Freq: Every day | OPHTHALMIC | Status: DC

## 2021-05-09 MED ORDER — PROCHLORPERAZINE MALEATE 5 MG PO TABS
5.0000 mg | ORAL_TABLET | Freq: Two times a day (BID) | ORAL | Status: DC
  Administered 2021-05-09 – 2021-05-11 (×4): 5 mg via ORAL
  Filled 2021-05-09 (×7): qty 1

## 2021-05-09 MED ORDER — DOCUSATE SODIUM 100 MG PO CAPS
100.0000 mg | ORAL_CAPSULE | Freq: Every day | ORAL | Status: DC
  Administered 2021-05-10 – 2021-05-11 (×2): 100 mg via ORAL
  Filled 2021-05-09 (×2): qty 1

## 2021-05-09 MED ORDER — INSULIN ASPART 100 UNIT/ML IJ SOLN
2.0000 [IU] | Freq: Three times a day (TID) | INTRAMUSCULAR | Status: DC
  Administered 2021-05-09 – 2021-05-11 (×6): 2 [IU] via SUBCUTANEOUS

## 2021-05-09 MED ORDER — INSULIN ASPART 100 UNIT/ML IJ SOLN
0.0000 [IU] | Freq: Three times a day (TID) | INTRAMUSCULAR | Status: DC
  Administered 2021-05-10: 1 [IU] via SUBCUTANEOUS

## 2021-05-09 MED ORDER — GUAIFENESIN-DM 100-10 MG/5ML PO SYRP: 15.0000 mL | ORAL_SOLUTION | ORAL | Status: DC | PRN

## 2021-05-09 MED ORDER — POLYETHYLENE GLYCOL 3350 17 G PO PACK: 17.0000 g | PACK | Freq: Every day | ORAL | Status: DC | PRN

## 2021-05-09 MED ORDER — FENTANYL CITRATE (PF) 100 MCG/2ML IJ SOLN
INTRAMUSCULAR | Status: DC | PRN
  Administered 2021-05-09: 50 ug via INTRAVENOUS

## 2021-05-09 SURGICAL SUPPLY — 38 items
BAG COUNTER SPONGE SURGICOUNT (BAG) IMPLANT
BAG SPNG CNTER NS LX DISP (BAG)
BLADE SAW RECIP 87.9 MT (BLADE) ×2 IMPLANT
BLADE SURG 21 STRL SS (BLADE) ×2 IMPLANT
BNDG COHESIVE 6X5 TAN NS LF (GAUZE/BANDAGES/DRESSINGS) ×1 IMPLANT
BNDG COHESIVE 6X5 TAN STRL LF (GAUZE/BANDAGES/DRESSINGS) IMPLANT
CANISTER WOUND CARE 500ML ATS (WOUND CARE) ×2 IMPLANT
COVER SURGICAL LIGHT HANDLE (MISCELLANEOUS) ×2 IMPLANT
CUFF TOURN SGL QUICK 34 (TOURNIQUET CUFF) ×2
CUFF TRNQT CYL 34X4.125X (TOURNIQUET CUFF) ×1 IMPLANT
DRAPE INCISE IOBAN 66X45 STRL (DRAPES) ×2 IMPLANT
DRAPE U-SHAPE 47X51 STRL (DRAPES) ×2 IMPLANT
DRESSING PREVENA PLUS CUSTOM (GAUZE/BANDAGES/DRESSINGS) ×1 IMPLANT
DRSG PREVENA PLUS CUSTOM (GAUZE/BANDAGES/DRESSINGS) ×2
DURAPREP 26ML APPLICATOR (WOUND CARE) ×2 IMPLANT
ELECT REM PT RETURN 9FT ADLT (ELECTROSURGICAL) ×2
ELECTRODE REM PT RTRN 9FT ADLT (ELECTROSURGICAL) ×1 IMPLANT
GLOVE SURG ORTHO LTX SZ9 (GLOVE) ×2 IMPLANT
GLOVE SURG UNDER POLY LF SZ9 (GLOVE) ×2 IMPLANT
GOWN STRL REUS W/ TWL XL LVL3 (GOWN DISPOSABLE) ×2 IMPLANT
GOWN STRL REUS W/TWL XL LVL3 (GOWN DISPOSABLE) ×4
KIT BASIN OR (CUSTOM PROCEDURE TRAY) ×2 IMPLANT
KIT TURNOVER KIT B (KITS) ×2 IMPLANT
MANIFOLD NEPTUNE II (INSTRUMENTS) ×2 IMPLANT
NS IRRIG 1000ML POUR BTL (IV SOLUTION) ×2 IMPLANT
PACK ORTHO EXTREMITY (CUSTOM PROCEDURE TRAY) ×2 IMPLANT
PAD ARMBOARD 7.5X6 YLW CONV (MISCELLANEOUS) ×2 IMPLANT
PREVENA RESTOR ARTHOFORM 46X30 (CANNISTER) ×2 IMPLANT
SPONGE T-LAP 18X18 ~~LOC~~+RFID (SPONGE) IMPLANT
STAPLER VISISTAT 35W (STAPLE) IMPLANT
STOCKINETTE IMPERVIOUS LG (DRAPES) ×2 IMPLANT
SUT ETHILON 2 0 PSLX (SUTURE) IMPLANT
SUT SILK 2 0 (SUTURE) ×2
SUT SILK 2-0 18XBRD TIE 12 (SUTURE) ×1 IMPLANT
SUT VIC AB 1 CTX 27 (SUTURE) ×4 IMPLANT
TOWEL GREEN STERILE (TOWEL DISPOSABLE) ×2 IMPLANT
TUBE CONNECTING 12X1/4 (SUCTIONS) ×2 IMPLANT
YANKAUER SUCT BULB TIP NO VENT (SUCTIONS) ×2 IMPLANT

## 2021-05-09 NOTE — Consult Note (Signed)
[facility] Renal Consultation Note    Indication for Consultation:  Management of ESRD/hemodialysis, anemia, hypertension/volume, and secondary hyperparathyroidism. PCP:  HPI: [name] is a 71 y.o. male with PMH including ESRD on dialysis, T2DM with diabetic foot ulcer, HTN, and hypothyroidism who had been haiving increased drainage from his left foot s/p fourth and fifth ray amputation. He was seen by Dr. [name] and decision was made to proceed with transtibial amputation, which was completed today. Nephrology has been consulted for management of ESRD during his admission.   Patient is on home hemodialysis 4 days per week. He completed HD on Sunday, Monday and Tuesday in preparation for his surgery. His wife helps with his dialysis and reports it has been going well. BP tends to run high on dialysis. Patient denies any shortness of breath, chest pain, palpitations, headaches, dizziness, abdominal pain, nausea, vomiting and diarrhea. Reports surgery went well, not in pain at present. He requests to not be placed on a renal diet. VSS with elevated blood pressure. Labs notable for Hgb 9.5, plt 100, K+ 4.0, BUN 26, Cr 4.3, Ca 8.9, Alb 4.0.   Past Medical History:  Diagnosis Date   Ambulates with cane    straight cane   Cervical myelopathy ([place]) 02/06/2018   Chronic kidney disease    dailysis M W F- home   Complication of anesthesia    Coronary artery disease    Diabetes mellitus without complication ([place])    type2   Diabetic foot ulcer ([place])    Diabetic neuropathy ([place]) 02/06/2018   Gait abnormality 08/24/2018   GERD (gastroesophageal reflux disease)     01/06/20- not current   Gout    History of blood transfusion    History of kidney stones    passed stones   Hypercholesteremia    Hypertension    Hypothyroidism    Neuromuscular disorder (HCC)    neuropathy left leg and bilateral feet   Neuropathy    PONV (postoperative nausea and vomiting)    Prostate cancer (HCC)     PVD (peripheral vascular disease) ([name])    with amputations   Past Surgical History:  Procedure Laterality Date   A-FLUTTER ABLATION N/A 04/06/2020   Procedure: A-FLUTTER ABLATION;  Surgeon: [name], MD;  Location: [facility];  Service: Cardiovascular;  Laterality: N/A;   AMPUTATION Left 12/25/2013   Procedure: AMPUTATION RAY LEFT 5TH RAY;  Surgeon: [name], MD;  Location: [facility];  Service: Orthopedics;  Laterality: Left;   AMPUTATION Right 12/15/2018   Procedure: AMPUTATION OF 4TH AND 5TH TOES RIGHT FOOT;  Surgeon: [name], MD;  Location: [place];  Service: Orthopedics;  Laterality: Right;   AMPUTATION Left 02/02/2021   Procedure: LEFT FOOT 4TH RAY AMPUTATION;  Surgeon: [name], MD;  Location: [place];  Service: Orthopedics;  Laterality: Left;   APPLICATION OF WOUND VAC Right 12/15/2018   Procedure: APPLICATION OF WOUND VAC;  Surgeon: [name], MD;  Location: [place];  Service: Orthopedics;  Laterality: Right;   AV FISTULA PLACEMENT Left 03/25/2019   Procedure: LEFT ARM ARTERIOVENOUS (AV) FISTULA CREATION;  Surgeon: [name], MD;  Location: [place];  Service: Vascular;  Laterality: Left;   [place] Left 05/20/2019   Procedure: SECOND STAGE LEFT BASCILIC VEIN TRANSPOSITION;  Surgeon: [name], MD;  Location: [place];  Service: Vascular;  Laterality: Left;   CARDIAC CATHETERIZATION  02/17/2014  CHOLECYSTECTOMY     COLONOSCOPY  2011   in [place], Normal   CORONARY ARTERY BYPASS GRAFT  2008   CYSTOSCOPY N/A 06/29/2020   Procedure: CYSTOSCOPY WITH CLOT EVACUATION AND  FULGERATION;  Surgeon: [name], MD;  Location: [name];  Service: Urology;  Laterality: N/A;   CYSTOSCOPY WITH FULGERATION Bilateral 06/17/2020   Procedure: CYSTOSCOPY,BILATERAL RETROGRADE, CLOT EVACUATION WITH FULGERATION OF THE BLADDER;  Surgeon: Janith Lima, MD;  Location: [place];  Service: Urology;  Laterality: Bilateral;    CYSTOSCOPY WITH FULGERATION N/A 06/28/2020   Procedure: CYSTOSCOPY WITH CLOT EVACUATION AND FULGERATION OF BLEEDERS;  Surgeon: [name], MD;  Location: [place];  Service: Urology;  Laterality: N/A;  1 HR   I & D EXTREMITY Right 12/15/2018   Procedure: DEBRIDEMENT RIGHT FOOT;  Surgeon: [name], MD;  Location: [place];  Service: Orthopedics;  Laterality: Right;   I & D EXTREMITY Right 08/27/2019   Procedure: PARTIAL CUBOID EXCISION RIGHT FOOT;  Surgeon: [name], MD;  Location: [place];  Service: Orthopedics;  Laterality: Right;   I & D EXTREMITY Right 01/07/2020   Procedure: RIGHT FOOT EXCISION INFECTED BONE;  Surgeon: [name], MD;  Location: [place];  Service: Orthopedics;  Laterality: Right;   NECK SURGERY     novemver 2019   TRANSURETHRAL RESECTION OF PROSTATE N/A 06/28/2020   Procedure: TRANSURETHRAL RESECTION OF THE PROSTATE (TURP);  Surgeon: [name], MD;  Location: [place];  Service: Urology;  Laterality: N/A;   WISDOM TOOTH EXTRACTION     Family History  Problem Relation Age of Onset   Diabetes Mellitus II Mother    Kidney disease Mother    Diabetes Mellitus II Father    CAD Father    Cancer Father        prostate   Kidney disease Father    Diabetes Mellitus II Brother    Kidney disease Brother    Diabetes Mellitus II Brother    Stomach cancer Brother 67   Kidney disease Brother    Colon cancer Neg Hx    Colon polyps Neg Hx    Esophageal cancer Neg Hx    Rectal cancer Neg Hx    Pancreatic cancer Neg Hx    Social History:  reports that he quit smoking about 32 years ago. His smoking use included cigars. He started smoking about 48 years ago. He has never used smokeless tobacco. He reports that he does not drink alcohol and does not use drugs.  ROS: As per HPI otherwise negative.   Physical Exam: Vitals:   05/09/21 0955 05/09/21 1010 05/09/21 1025 05/09/21 1039  BP: (!) 157/69 (!) 157/61 (!) 150/66 (!) 161/59  Pulse: 62 63 63 62  Resp: 15 15  12    Temp:   97.8 F (36.6 C) (!) 97.4 F (36.3 C)  TempSrc:    Oral  SpO2: 99% 98% 99%   Weight:      Height:         General: Well developed, well nourished, in no acute distress. Head: Normocephalic, atraumatic, sclera non-icteric, mucus membranes are moist. Neck: JVD not elevated. Lungs: Clear bilaterally to auscultation without wheezes, rales, or rhonchi. Breathing is unlabored on RA Heart: RRR with normal S1, S2. No murmurs, rubs, or gallops appreciated. Abdomen: Soft, non-tender, non-distended with normoactive bowel sounds. No rebound/guarding. No obvious abdominal masses. Lower extremities: Left stump wrapped in bandage. No edema RLE Neuro: Alert and oriented X 3. Moves all extremities  spontaneously. Psych:  Responds to questions appropriately with a normal affect. Dialysis Access: LUE AVF +t/b with button holes  Allergies  Allergen Reactions   Mushroom Extract Complex Nausea Only   Prior to Admission medications   Medication Sig Start Date End Date Taking? Authorizing Provider  allopurinol (ZYLOPRIM) 100 MG tablet Take 200 mg by mouth daily.  05/04/18  Yes [provider]  amoxicillin-clavulanate (AUGMENTIN) 500-125 MG tablet Take 1 tablet (500 mg total) by mouth daily. Take daily in the evening. 04/19/21  Yes [name], MD  B Complex-C-Zn-Folic Acid (DIALYVITE 831 WITH ZINC) 0.8 MG TABS Take 1 tablet by mouth daily. 12/23/19  Yes [provider]  calcitRIOL (ROCALTROL) 0.5 MCG capsule Take 0.5 mcg by mouth daily. 01/09/21  Yes [provider]  carvedilol (COREG) 12.5 MG tablet Take 1 tablet (12.5 mg total) by mouth in the morning and at bedtime. 11/01/20  Yes Adrian Prows, MD  levothyroxine (SYNTHROID, LEVOTHROID) 112 MCG tablet Take 112 mcg by mouth daily before breakfast.   Yes [provider]  losartan (COZAAR) 100 MG tablet Take 100 mg by mouth daily.   Yes [provider]  nitroGLYCERIN (NITRODUR - DOSED IN MG/24 HR) 0.2 mg/hr  patch Place 1 patch (0.2 mg total) onto the skin daily. 02/23/21  Yes Dondra Prader R, NP  oxyCODONE-acetaminophen (PERCOCET/ROXICET) 5-325 MG tablet Take 1 tablet by mouth every 4 (four) hours as needed. 02/06/21  Yes [name], MD  Polyethyl Glycol-Propyl Glycol (LUBRICANT EYE DROPS) 0.4-0.3 % SOLN Place 1-2 drops into both eyes in the morning and at bedtime.   Yes [provider]  pregabalin (LYRICA) 150 MG capsule Take 1 capsule (150 mg total) by mouth in the morning and at bedtime. Patient taking differently: Take 150-300 mg by mouth See admin instructions. Take 150 mg in the morning and 300 mg at bedtime 05/09/20  Yes Persons, Bevely Palmer, PA  prochlorperazine (COMPAZINE) 5 MG tablet TAKE 1 TABLET BY MOUTH TWICE A DAY 04/16/21  Yes Irene Shipper, MD  rosuvastatin (CRESTOR) 10 MG tablet Take 10 mg by mouth daily.   Yes [provider]  Semaglutide,0.25 or 0.5MG /DOS, (OZEMPIC, 0.25 OR 0.5 MG/DOSE,) 2 MG/1.5ML SOPN Inject 0.5 mg into the skin every Friday.   Yes [provider]  silver sulfADIAZINE (SILVADENE) 1 % cream APPLY TO AFFECTED AREA EVERY DAY 03/21/21  Yes Dondra Prader R, NP  acetaminophen (TYLENOL) 500 MG tablet Take 1,000-2,000 mg by mouth 2 (two) times daily as needed for moderate pain.    [provider]  Lancets (ONETOUCH DELICA PLUS [id number]) [place]  01/17/20   [provider]   Current Facility-Administered Medications  Medication Dose Route Frequency Provider Last Rate Last Admin   0.9 %  sodium chloride infusion   Intravenous Continuous [name], MD 10 mL/hr at 05/09/21 1054 New Bag at 05/09/21 1054   [START ON 05/10/2021] allopurinol (ZYLOPRIM) tablet 200 mg  200 mg Oral Daily [name], MD       alum & mag hydroxide-simeth (MAALOX/MYLANTA) 200-200-20 MG/5ML suspension 15-30 mL  15-30 mL Oral Q2H PRN [name], MD       ascorbic acid (VITAMIN C) tablet 1,000 mg  1,000 mg Oral Daily [name], MD   1,000 mg at 05/09/21  1107   bisacodyl (DULCOLAX) EC tablet 5 mg  5 mg Oral Daily PRN [name], MD       calcitRIOL (ROCALTROL) capsule 0.5 mcg  0.5 mcg Oral Daily [name], MD       carvedilol (COREG) tablet 12.5 mg  12.5 mg Oral BID WC [name], MD       ceFAZolin (ANCEF) IVPB 2g/100 mL premix  2 g Intravenous Once [name], MD       [START ON 05/10/2021] docusate sodium (COLACE) capsule 100 mg  100 mg Oral Daily [name], MD       guaiFENesin-dextromethorphan (ROBITUSSIN DM) 100-10 MG/5ML syrup 15 mL  15 mL Oral Q4H PRN [name], MD       hydrALAZINE (APRESOLINE) injection 5 mg  5 mg Intravenous Q20 Min PRN [name], MD       insulin aspart (novoLOG) injection 0-6 Units  0-6 Units Subcutaneous TID WC [name], MD       insulin aspart (novoLOG) injection 2 Units  2 Units Subcutaneous TID WC [name], MD       labetalol (NORMODYNE) injection 10 mg  10 mg Intravenous Q10 min PRN [name], MD       [START ON 05/10/2021] levothyroxine (SYNTHROID) tablet 112 mcg  112 mcg Oral QAC breakfast [name], MD       [START ON 05/10/2021] losartan (COZAAR) tablet 100 mg  100 mg Oral Daily [name], MD       magnesium sulfate IVPB 2 g 50 mL  2 g Intravenous Daily PRN [name], MD       metoprolol tartrate (LOPRESSOR) injection 2-5 mg  2-5 mg Intravenous Q2H PRN [name], MD       nutrition supplement (JUVEN) (JUVEN) powder packet 1 packet  1 packet Oral BID BM [name], MD   1 packet at 05/09/21 1107   ondansetron (ZOFRAN) injection 4 mg  4 mg Intravenous Q6H PRN [name], MD       pantoprazole (PROTONIX) EC tablet 40 mg  40 mg Oral Daily [name], MD   40 mg at 05/09/21 1107   phenol (CHLORASEPTIC) mouth spray 1 spray  1 spray Mouth/Throat PRN [name], MD       polyethylene glycol (MIRALAX / GLYCOLAX) packet 17 g  17 g Oral Daily PRN [name], MD       polyvinyl alcohol (LIQUIFILM TEARS) 1.4 % ophthalmic solution 1-2 drop   1-2 drop Both Eyes Daily Pham, Minh Q, RPH-CPP       potassium chloride SA (KLOR-CON M) CR tablet 20-40 mEq  20-40 mEq Oral Daily PRN [name], MD       pregabalin (LYRICA) capsule 150 mg  150 mg Oral Daily [name], MD       pregabalin (LYRICA) capsule 300 mg  300 mg Oral QHS Pham, Minh Q, RPH-CPP       prochlorperazine (COMPAZINE) tablet 5 mg  5 mg Oral BID [name], MD       rosuvastatin (CRESTOR) tablet 10 mg  10 mg Oral Daily [name], MD       [START ON 05/11/2021] Semaglutide(0.25 or 0.5MG /DOS) SOPN 0.5 mg  0.5 mg Subcutaneous Q Fri Duda, Marcus V, MD       zinc sulfate capsule 220 mg  220 mg Oral Daily [name], MD   220 mg at 05/09/21 1107   Labs: Basic Metabolic Panel: Recent Labs  Lab 05/09/21 0724 05/09/21 0752  NA 134* 134*  K 4.0  4.0  CL 96* 95*  CO2 27  --   GLUCOSE 144* 139*  BUN 27* 26*  CREATININE 4.14* 4.30*  CALCIUM 8.9  --    Liver Function Tests: Recent Labs  Lab 05/09/21 0724  AST 21  ALT 18  ALKPHOS 73  BILITOT 0.9  PROT 6.4*  ALBUMIN 4.0   No results for input(s): LIPASE, AMYLASE in the last 168 hours. No results for input(s): AMMONIA in the last 168 hours. CBC: Recent Labs  Lab 05/04/21 1042 05/09/21 0724 05/09/21 0752  WBC 7.5 5.0  --   NEUTROABS  --  3.8  --   HGB 10.0* 9.6* 9.5*  HCT 29.2* 28.3* 28.0*  MCV 89.8 90.7  --   PLT 123* 100*  --    Cardiac Enzymes: No results for input(s): CKTOTAL, CKMB, CKMBINDEX, TROPONINI in the last 168 hours. CBG: Recent Labs  Lab 05/04/21 0956 05/09/21 0652 05/09/21 0922  GLUCAP 126* 163* 152*   Iron Studies: No results for input(s): IRON, TIBC, TRANSFERRIN, FERRITIN in the last 72 hours. Studies/Results: No results found.  Dialysis Orders: Center: [facility] 4 times per week. 2K lactate 45, AVF with buttonholes, 15g. EDW 89kg. No heparin Mircera 168mcg subcutaneous given 05/03/21  Assessment/Plan:  Diabetic foot ulcer: S/p transtibial amputation by Dr. [name].  Noted plan is for rehab, will need to arrange temporary in center dialysis if patient goes to a SNF.  ESRD:  Completes dialysis 4 days per week, has already had 3 treatments this week and no indications for HD today. Will plan for next dialysis tomorrow and follow TTS schedule while admitted.  Hypertension/volume: BP moderately elevated, stable for this patient. Weight elevated but not sure if EDW is accurate, disparity between home weights and clinic weights. Typically has 1-2L UF with HD. Continue home losartan and coreg.  Anemia: Hgb 9.5, anticipate post op decline. Will increase next ESA dose   Metabolic bone disease: Calcium and phos controlled. Continue calcitriol and renvela.   Nutrition:  Alb 4.0. Potassium is well controlled. does not need renal diet at present.  Diabetes mellitus: on insulin, management per primary team  Anice Paganini, PA-C 05/09/2021, 11:40 AM  [facility] Pager: [phone]

## 2021-05-09 NOTE — Anesthesia Procedure Notes (Signed)
Anesthesia Regional Block: Popliteal block   Pre-Anesthetic Checklist: , timeout performed,  Correct Patient, Correct Site, Correct Laterality,  Correct Procedure, Correct Position, site marked,  Risks and benefits discussed,  Surgical consent,  Pre-op evaluation,  At surgeon's request and post-op pain management  Laterality: Left  Prep: chloraprep       Needles:  Injection technique: Single-shot  Needle Type: Echogenic Stimulator Needle     Needle Length: 10cm  Needle Gauge: 20     Additional Needles:   Procedures:,,,, ultrasound used (permanent image in chart),,    Narrative:  Start time: 05/09/2021 7:45 AM End time: 05/09/2021 7:55 AM Injection made incrementally with aspirations every 5 mL.  Performed by: Personally  Anesthesiologist: Murvin Natal, MD  Additional Notes: Functioning IV was confirmed and monitors were applied.  A timeout was performed. Sterile prep, hand hygiene and sterile gloves were used. A 155mm 20ga BBraun echogenic stimulator needle was used. Negative aspiration and negative test dose prior to incremental administration of local anesthetic. The patient tolerated the procedure well.  Ultrasound guidance: relevent anatomy identified, needle position confirmed, local anesthetic spread visualized around nerve(s), vascular puncture avoided.  Image printed for medical record.

## 2021-05-09 NOTE — Plan of Care (Signed)

## 2021-05-09 NOTE — Anesthesia Procedure Notes (Signed)
Anesthesia Regional Block: Adductor canal block   Pre-Anesthetic Checklist: , timeout performed,  Correct Patient, Correct Site, Correct Laterality,  Correct Procedure,, site marked,  Risks and benefits discussed,  Surgical consent,  Pre-op evaluation,  At surgeon's request and post-op pain management  Laterality: Left  Prep: chloraprep       Needles:  Injection technique: Single-shot  Needle Type: Echogenic Stimulator Needle     Needle Length: 10cm  Needle Gauge: 20     Additional Needles:   Procedures:,,,, ultrasound used (permanent image in chart),,    Narrative:  Start time: 05/09/2021 7:50 AM End time: 05/09/2021 8:00 AM Injection made incrementally with aspirations every 5 mL.  Performed by: Personally  Anesthesiologist: Murvin Natal, MD  Additional Notes: Functioning IV was confirmed and monitors were applied. A time-out was performed. Hand hygiene and sterile gloves were used. The thigh was placed in a frog-leg position and prepped in a sterile fashion. A 155mm 20ga Bbraun echogenic stimulator needle was placed using ultrasound guidance.  Negative aspiration and negative test dose prior to incremental administration of local anesthetic. The patient tolerated the procedure well.

## 2021-05-09 NOTE — Transfer of Care (Signed)
Immediate Anesthesia Transfer of Care Note  Patient: [name]  Procedure(s) Performed: LEFT BELOW KNEE AMPUTATION (Left: Knee)  Patient Location: PACU  Anesthesia Type:MAC and Regional  Level of Consciousness: awake, alert  and oriented  Airway & Oxygen Therapy: Patient Spontanous Breathing  Post-op Assessment: Report given to RN and Post -op Vital signs reviewed and stable  Post vital signs: Reviewed and stable  Last Vitals:  Vitals Value Taken Time  BP 134/72 05/09/21 0922  Temp    Pulse 66 05/09/21 0923  Resp 11 05/09/21 0923  SpO2 97 % 05/09/21 0923  Vitals shown include unvalidated device data.  Last Pain:  Vitals:   05/09/21 0738  TempSrc:   PainSc: 6       Patients Stated Pain Goal: 3 (37/36/68 1594)  Complications: No notable events documented.

## 2021-05-09 NOTE — Anesthesia Postprocedure Evaluation (Signed)
Anesthesia Post Note  Patient: [name]  Procedure(s) Performed: LEFT BELOW KNEE AMPUTATION (Left: Knee)     Patient location during evaluation: PACU Anesthesia Type: Regional Level of consciousness: awake Pain management: pain level controlled Vital Signs Assessment: post-procedure vital signs reviewed and stable Respiratory status: spontaneous breathing, nonlabored ventilation, respiratory function stable and patient connected to nasal cannula oxygen Cardiovascular status: stable and blood pressure returned to baseline Postop Assessment: no apparent nausea or vomiting Anesthetic complications: no   No notable events documented.  Last Vitals:  Vitals:   05/09/21 1025 05/09/21 1039  BP: (!) 150/66 (!) 161/59  Pulse: 63 62  Resp: 12   Temp: 36.6 C (!) 36.3 C  SpO2: 99%     Last Pain:  Vitals:   05/09/21 1039  TempSrc: Oral  PainSc:                  [name]

## 2021-05-09 NOTE — H&P (Signed)
[name] is an 71 y.o. male.   Chief Complaint: Pain ulceration deformity left foot HPI: Patient is a 71 year old gentleman who presents complaining of increasing drainage from the ulcer left foot status post fourth and fifth ray amputation.  Patient has worked at [employer] for double upright braces for both lower extremities.  Past Medical History:  Diagnosis Date   Ambulates with cane    straight cane   Cervical myelopathy ([place]) 02/06/2018   Chronic kidney disease    dailysis M W F- home   Complication of anesthesia    Coronary artery disease    Diabetes mellitus without complication ([place])    type2   Diabetic foot ulcer ([place])    Diabetic neuropathy ([place]) 02/06/2018   Gait abnormality 08/24/2018   GERD (gastroesophageal reflux disease)     01/06/20- not current   Gout    History of blood transfusion    History of kidney stones    passed stones   Hypercholesteremia    Hypertension    Hypothyroidism    Neuromuscular disorder (HCC)    neuropathy left leg and bilateral feet   Neuropathy    PONV (postoperative nausea and vomiting)    Prostate cancer (HCC)    PVD (peripheral vascular disease) ([place])    with amputations    Past Surgical History:  Procedure Laterality Date   A-FLUTTER ABLATION N/A 04/06/2020   Procedure: A-FLUTTER ABLATION;  Surgeon: [name], MD;  Location: [facility];  Service: Cardiovascular;  Laterality: N/A;   AMPUTATION Left 12/25/2013   Procedure: AMPUTATION RAY LEFT 5TH RAY;  Surgeon: [name], MD;  Location: [facility];  Service: Orthopedics;  Laterality: Left;   AMPUTATION Right 12/15/2018   Procedure: AMPUTATION OF 4TH AND 5TH TOES RIGHT FOOT;  Surgeon: [name], MD;  Location: [facility];  Service: Orthopedics;  Laterality: Right;   AMPUTATION Left 02/02/2021   Procedure: LEFT FOOT 4TH RAY AMPUTATION;  Surgeon: [name], MD;  Location: [facility];  Service: Orthopedics;  Laterality: Left;   APPLICATION OF WOUND VAC Right 12/15/2018    Procedure: APPLICATION OF WOUND VAC;  Surgeon: [name], MD;  Location: [facility];  Service: Orthopedics;  Laterality: Right;   AV FISTULA PLACEMENT Left 03/25/2019   Procedure: LEFT ARM ARTERIOVENOUS (AV) FISTULA CREATION;  Surgeon: [name], MD;  Location: [facility];  Service: Vascular;  Laterality: Left;   [place] Left 05/20/2019   Procedure: SECOND STAGE LEFT BASCILIC VEIN TRANSPOSITION;  Surgeon: [name], MD;  Location: [facility];  Service: Vascular;  Laterality: Left;   CARDIAC CATHETERIZATION  02/17/2014   CHOLECYSTECTOMY     COLONOSCOPY  2011   in [place], [place]  2008   CYSTOSCOPY N/A 06/29/2020   Procedure: [place];  Surgeon: [name], MD;  Location: [facility];  Service: Urology;  Laterality: N/A;   CYSTOSCOPY WITH FULGERATION Bilateral 06/17/2020   Procedure: CYSTOSCOPY,BILATERAL RETROGRADE, CLOT EVACUATION WITH FULGERATION OF THE BLADDER;  Surgeon: Janith Lima, MD;  Location: [facility];  Service: Urology;  Laterality: Bilateral;   CYSTOSCOPY WITH FULGERATION N/A 06/28/2020   Procedure: CYSTOSCOPY WITH CLOT EVACUATION AND FULGERATION OF BLEEDERS;  Surgeon: [name], MD;  Location: [facility];  Service: Urology;  Laterality: N/A;  1 HR   I & D EXTREMITY Right 12/15/2018   Procedure: DEBRIDEMENT RIGHT FOOT;  Surgeon: [name], MD;  Location: [facility];  Service: Orthopedics;  Laterality: Right;   I & D EXTREMITY Right 08/27/2019   Procedure: PARTIAL CUBOID EXCISION RIGHT FOOT;  Surgeon: [name], MD;  Location: [facility];  Service: Orthopedics;  Laterality: Right;   I & D EXTREMITY Right 01/07/2020   Procedure: RIGHT FOOT EXCISION INFECTED BONE;  Surgeon: [name], MD;  Location: [name];  Service: Orthopedics;  Laterality: Right;   NECK SURGERY     novemver 2019   TRANSURETHRAL RESECTION OF PROSTATE N/A 06/28/2020   Procedure: TRANSURETHRAL RESECTION  OF THE PROSTATE (TURP);  Surgeon: [name], MD;  Location: [facility];  Service: Urology;  Laterality: N/A;   WISDOM TOOTH EXTRACTION      Family History  Problem Relation Age of Onset   Diabetes Mellitus II Mother    Kidney disease Mother    Diabetes Mellitus II Father    CAD Father    Cancer Father        prostate   Kidney disease Father    Diabetes Mellitus II Brother    Kidney disease Brother    Diabetes Mellitus II Brother    Stomach cancer Brother 58   Kidney disease Brother    Colon cancer Neg Hx    Colon polyps Neg Hx    Esophageal cancer Neg Hx    Rectal cancer Neg Hx    Pancreatic cancer Neg Hx    Social History:  reports that he quit smoking about 32 years ago. His smoking use included cigars. He started smoking about 48 years ago. He has never used smokeless tobacco. He reports that he does not drink alcohol and does not use drugs.  Allergies:  Allergies  Allergen Reactions   Mushroom Extract Complex Nausea Only    Facility-Administered Medications Prior to Admission  Medication Dose Route Frequency Provider Last Rate Last Admin   0.9 %  sodium chloride infusion  500 mL Intravenous Continuous Irene Shipper, MD       Medications Prior to Admission  Medication Sig Dispense Refill   acetaminophen (TYLENOL) 500 MG tablet Take 1,000-2,000 mg by mouth 2 (two) times daily as needed for moderate pain.     allopurinol (ZYLOPRIM) 100 MG tablet Take 200 mg by mouth daily.      amoxicillin-clavulanate (AUGMENTIN) 500-125 MG tablet Take 1 tablet (500 mg total) by mouth daily. Take daily in the evening. 30 tablet 0   B Complex-C-Zn-Folic Acid (DIALYVITE 676 WITH ZINC) 0.8 MG TABS Take 1 tablet by mouth daily.     calcitRIOL (ROCALTROL) 0.5 MCG capsule Take 0.5 mcg by mouth daily.     carvedilol (COREG) 12.5 MG tablet Take 1 tablet (12.5 mg total) by mouth in the morning and at bedtime. 180 tablet 3   levothyroxine (SYNTHROID, LEVOTHROID) 112 MCG tablet Take 112 mcg by  mouth daily before breakfast.     losartan (COZAAR) 100 MG tablet Take 100 mg by mouth daily.     nitroGLYCERIN (NITRODUR - DOSED IN MG/24 HR) 0.2 mg/hr patch Place 1 patch (0.2 mg total) onto the skin daily. 30 patch 3   oxyCODONE-acetaminophen (PERCOCET/ROXICET) 5-325 MG tablet Take 1 tablet by mouth every 4 (four) hours as needed. 30 tablet 0   Polyethyl Glycol-Propyl Glycol (LUBRICANT EYE DROPS) 0.4-0.3 % SOLN Place 1-2 drops into both eyes in the morning and at bedtime.     pregabalin (LYRICA) 150 MG capsule Take 1 capsule (150 mg total) by  mouth in the morning and at bedtime. (Patient taking differently: Take 150-300 mg by mouth See admin instructions. Take 150 mg in the morning and 300 mg at bedtime) 60 capsule 0   prochlorperazine (COMPAZINE) 5 MG tablet TAKE 1 TABLET BY MOUTH TWICE A DAY 60 tablet 3   rosuvastatin (CRESTOR) 10 MG tablet Take 10 mg by mouth daily.     Semaglutide,0.25 or 0.5MG /DOS, (OZEMPIC, 0.25 OR 0.5 MG/DOSE,) 2 MG/1.5ML SOPN Inject 0.5 mg into the skin every Friday.     Lancets (ONETOUCH DELICA PLUS [id number]) MISC      silver sulfADIAZINE (SILVADENE) 1 % cream APPLY TO AFFECTED AREA EVERY DAY 400 g 0    Results for orders placed or performed during the hospital encounter of 05/07/21 (from the past 48 hour(s))  SARS CORONAVIRUS 2 (TAT 6-24 HRS) Nasopharyngeal Nasopharyngeal Swab     Status: None   Collection Time: 05/07/21  8:53 AM   Specimen: Nasopharyngeal Swab  Result Value Ref Range   SARS Coronavirus 2 NEGATIVE NEGATIVE    Comment: (NOTE) SARS-CoV-2 target nucleic acids are NOT DETECTED.  The SARS-CoV-2 RNA is generally detectable in upper and lower respiratory specimens during the acute phase of infection. Negative results do not preclude SARS-CoV-2 infection, do not rule out co-infections with other pathogens, and should not be used as the sole basis for treatment or other patient management decisions. Negative results must be combined with clinical  observations, patient history, and epidemiological information. The expected result is Negative.  Fact Sheet for Patients: SugarRoll.be  Fact Sheet for Healthcare Providers: https://www.woods-mathews.com/  This test is not yet approved or cleared by the [place] FDA and  has been authorized for detection and/or diagnosis of SARS-CoV-2 by FDA under an Emergency Use Authorization (EUA). This EUA will remain  in effect (meaning this test can be used) for the duration of the COVID-19 declaration under Se ction 564(b)(1) of the Act, 21 U.S.C. section 360bbb-3(b)(1), unless the authorization is terminated or revoked sooner.  Performed at [facility], [place] [address], [place], [place] [address]    No results found.  Review of Systems  All other systems reviewed and are negative.  Blood pressure (!) 166/56, pulse 70, temperature 98.1 F (36.7 C), temperature source Oral, resp. rate 18, height 5\' 11"  (1.803 m), weight 97.5 kg, SpO2 99 %. Physical Exam  Patient is alert, oriented, no adenopathy, well-dressed, normal affect, normal respiratory effort. Examination patient has increased hypergranulation tissue over the third metatarsal head.  He has a painful ischemic ulcer of the base of the fifth metatarsal left foot.  There is exposed bone of the third metatarsal head within the wound.  There is no ascending cellulitis.   Patient's most recent ankle-brachial indices was in December.  Ankle pressures were noncompressible toe pressures showed a normal great toe pressure. Assessment/Plan Assessment: Osteomyelitis and ulceration left foot status post foot salvage intervention.  Plan: With the progressive ulceration osteomyelitis of the left foot patient does not have further foot salvage intervention options.  Discussed the treatment option of a transtibial amputation.  Postoperative care was discussed.  Patient states he would like to proceed  with surgery.  Anticipate inpatient versus outpatient rehab.  [name], MD 05/09/2021, 6:55 AM

## 2021-05-09 NOTE — Op Note (Signed)
° °  Date of Surgery: 05/09/2021  INDICATIONS: Mr. Strollo is a 71 y.o.-year-old male who presents with osteomyelitis and ulceration left foot status post foot salvage intervention.Marland Kitchen  PREOPERATIVE DIAGNOSIS: Osteomyelitis ulceration left foot  POSTOPERATIVE DIAGNOSIS: Same.  PROCEDURE: Transtibial amputation Application of Prevena wound VAC  SURGEON: [name], M.D.  ANESTHESIA:  general  IV FLUIDS AND URINE: See anesthesia records.  ESTIMATED BLOOD LOSS: See anesthesia records.  COMPLICATIONS: None.  DESCRIPTION OF PROCEDURE: The patient was brought to the operating room after undergoing regional anesthetic. After adequate levels of anesthesia were obtained patient's lower extremity was prepped using DuraPrep draped into a sterile field. A timeout was called. The foot was draped out of the sterile field with impervious stockinette. A transverse incision was made 11 cm distal to the tibial tubercle. This curved proximally and a large posterior flap was created. The tibia was transected 1 cm proximal to the skin incision. The fibula was transected just proximal to the tibial incision. The tibia was beveled anteriorly. A large posterior flap was created. The sciatic nerve was pulled cut and allowed to retract. The vascular bundles were suture ligated with 2-0 silk. The deep and superficial fascial layers were closed using #1 Vicryl. The skin was closed using staples and 2-0 nylon. The wound was covered with a Prevena customizable and arthroform wound VAC.  The dressing was sealed with dermatac there was a good suction fit. A prosthetic shrinker and limb protector were applied. Patient was taken to the PACU in stable condition.   DISCHARGE PLANNING:  Antibiotic duration: 24 hours  Weightbearing: Nonweightbearing on the operative extremity  Pain medication: Opioid pathway  Dressing care/ Wound VAC: Continue wound VAC for 1 week after discharge  Discharge to: Discharge planning based on  therapy's recommendations for possible inpatient rehabilitation, outpatient rehabilitation, or discharge to home with therapy  Follow-up: In the office 1 week post operative.  Meridee Score, MD [place] 9:30 AM

## 2021-05-10 ENCOUNTER — Encounter (HOSPITAL_COMMUNITY): Payer: Self-pay | Admitting: Orthopedic Surgery

## 2021-05-10 LAB — CBC
HCT: 28.3 % — ABNORMAL LOW (ref 39.0–52.0)
Hemoglobin: 9.4 g/dL — ABNORMAL LOW (ref 13.0–17.0)
MCH: 30.5 pg (ref 26.0–34.0)
MCHC: 33.2 g/dL (ref 30.0–36.0)
MCV: 91.9 fL (ref 80.0–100.0)
Platelets: 123 10*3/uL — ABNORMAL LOW (ref 150–400)
RBC: 3.08 MIL/uL — ABNORMAL LOW (ref 4.22–5.81)
RDW: 16.5 % — ABNORMAL HIGH (ref 11.5–15.5)
WBC: 8 10*3/uL (ref 4.0–10.5)
nRBC: 0.3 % — ABNORMAL HIGH (ref 0.0–0.2)

## 2021-05-10 LAB — GLUCOSE, CAPILLARY
Glucose-Capillary: 141 mg/dL — ABNORMAL HIGH (ref 70–99)
Glucose-Capillary: 151 mg/dL — ABNORMAL HIGH (ref 70–99)
Glucose-Capillary: 144 mg/dL — ABNORMAL HIGH (ref 70–99)

## 2021-05-10 LAB — RENAL FUNCTION PANEL
Albumin: 3.3 g/dL — ABNORMAL LOW (ref 3.5–5.0)
Anion gap: 16 — ABNORMAL HIGH (ref 5–15)
BUN: 48 mg/dL — ABNORMAL HIGH (ref 8–23)
CO2: 21 mmol/L — ABNORMAL LOW (ref 22–32)
Calcium: 8.4 mg/dL — ABNORMAL LOW (ref 8.9–10.3)
Chloride: 95 mmol/L — ABNORMAL LOW (ref 98–111)
Creatinine, Ser: 5.35 mg/dL — ABNORMAL HIGH (ref 0.61–1.24)
GFR, Estimated: 11 mL/min — ABNORMAL LOW (ref >60)
Glucose, Bld: 136 mg/dL — ABNORMAL HIGH (ref 70–99)
Phosphorus: 4.1 mg/dL (ref 2.5–4.6)
Potassium: 4.6 mmol/L (ref 3.5–5.1)
Sodium: 132 mmol/L — ABNORMAL LOW (ref 135–145)

## 2021-05-10 LAB — HEPATITIS B SURFACE ANTIBODY, QUANTITATIVE: Hep B S AB Quant (Post): 106.9 m[IU]/mL (ref >9.9)

## 2021-05-10 MED ORDER — ACETAMINOPHEN 325 MG PO TABS
650.0000 mg | ORAL_TABLET | Freq: Four times a day (QID) | ORAL | Status: DC | PRN
  Administered 2021-05-10: 650 mg via ORAL
  Filled 2021-05-10: qty 2

## 2021-05-10 NOTE — Evaluation (Signed)
Occupational Therapy Evaluation Patient Details Name: [name] MRN: [id number] DOB: 04/06/1951 Today's Date: 05/10/2021   History of Present Illness Pt is a 71 y/o male who presented with osteomyelitis and worsening ulcers of L foot. Pt underwent L BKA on 2/1. PMH: CKD on HD at home, CAD, DM, GERD, gout, HTN, PVD, prostate cancer.   Clinical Impression   PTA, pt lives with spouse and reports Modified Independence with ADLs/mobility and shares household IADLs with spouse. Pt presents now s/p L BKA with post op deficits in sitting/standing balance, endurance, cognition, strength and pain. Pt also with asterixis-type movements of B UE/LE throughout session increasing fall risk. Pt currently requires Min A for UB ADL, up to Total A for LB ADLs (+2 if in standing) and Mod A x 2 for sit to stand transfers with RW. Pt unable to successfully hop today with asterixis movements causing R knee and BUE to give out with prolonged standing. As pt is significantly below baseline and based on rehab potential, recommend AIR prior to discharge home.      Recommendations for follow up therapy are one component of a multi-disciplinary discharge planning process, led by the attending physician.  Recommendations may be updated based on patient status, additional functional criteria and insurance authorization.   Follow Up Recommendations  Acute inpatient rehab (3hours/day)    Assistance Recommended at Discharge Frequent or constant Supervision/Assistance  Patient can return home with the following Two people to help with walking and/or transfers;A lot of help with bathing/dressing/bathroom;Assistance with cooking/housework;Help with stairs or ramp for entrance;Assist for transportation;Direct supervision/assist for medications management    Functional Status Assessment  Patient has had a recent decline in their functional status and demonstrates the ability to make significant improvements in function in a  reasonable and predictable amount of time.  Equipment Recommendations  BSC/3in1    Recommendations for Other Services Rehab consult     Precautions / Restrictions Precautions Precautions: Fall;Other (comment) Precaution Comments: wound vac L LE Required Braces or Orthoses: Other Brace Other Brace: limb protector L LE, AFO to R LE Restrictions Weight Bearing Restrictions: Yes LLE Weight Bearing: Non weight bearing      Mobility Bed Mobility Overal bed mobility: Needs Assistance Bed Mobility: Supine to Sit, Sit to Supine     Supine to sit: Min guard, HOB elevated Sit to supine: Min guard   General bed mobility comments: min guard d/t asterixis-type movements, to ensure safety    Transfers Overall transfer level: Needs assistance Equipment used: Rolling walker (2 wheels) Transfers: Sit to/from Stand Sit to Stand: Mod assist, +2 physical assistance, +2 safety/equipment           General transfer comment: Pt with difficulty achieving full upright standing but after switching to L hadn on RW and pushing with R hand from bed, pt able to fully stand EOB. Unable to successfully hop, but able to stand 15 seconds before asterixis movements resulted in sudden sit back on EOB      Balance Overall balance assessment: Needs assistance Sitting-balance support: Feet supported, Bilateral upper extremity supported Sitting balance-Leahy Scale: Poor Sitting balance - Comments: intermittent posterior LOB, able to correct with no more than Min A and progress to sitting unsuppported with min guard for safety   Standing balance support: Bilateral upper extremity supported, During functional activity Standing balance-Leahy Scale: Poor Standing balance comment: BUE support on RW in standing + external support  ADL either performed or assessed with clinical judgement   ADL Overall ADL's : Needs assistance/impaired Eating/Feeding: Set  up;Sitting Eating/Feeding Details (indicate cue type and reason): noted with spillage on self/linens on entry (likely from asterixis-type movements) Grooming: Min guard;Sitting   Upper Body Bathing: Minimal assistance;Sitting;Bed level Upper Body Bathing Details (indicate cue type and reason): able to assist in bathing UB after spillage from breakfast Lower Body Bathing: Maximal assistance;Sitting/lateral leans;Bed level   Upper Body Dressing : Minimal assistance;Bed level;Sitting Upper Body Dressing Details (indicate cue type and reason): to don new gown, dificulty with coordination Lower Body Dressing: Maximal assistance;Sitting/lateral leans Lower Body Dressing Details (indicate cue type and reason): Max A to don shoe/AFO to [place] and Hygiene: Total assistance;Bed level         General ADL Comments: Pt limited by asterixis movements of UE/LEs increasing fall risk sitting/standing attempts. expected post op deficits in balance noted requiring increased assist with LB ADLs     Vision Baseline Vision/History: 1 Wears glasses Ability to See in Adequate Light: 1 Impaired Patient Visual Report: No change from baseline Vision Assessment?: No apparent visual deficits     Perception     Praxis      Pertinent Vitals/Pain Pain Assessment Pain Assessment: Faces Faces Pain Scale: Hurts a little bit Pain Location: L LE Pain Descriptors / Indicators: Sore Pain Intervention(s): Monitored during session, Patient requesting pain meds-RN notified     Hand Dominance Right   Extremity/Trunk Assessment Upper Extremity Assessment Upper Extremity Assessment: RUE deficits/detail;LUE deficits/detail RUE Deficits / Details: Asterixis-type movements noted; some difficulty overshooting/undershooting targets and difficulty maintaining grasp at times RUE Coordination: decreased fine motor LUE Deficits / Details: Asterixis-type movements noted; some difficulty  overshooting/undershooting targets and difficulty maintaining grasp at times LUE Coordination: decreased fine motor   Lower Extremity Assessment Lower Extremity Assessment: Defer to PT evaluation   Cervical / Trunk Assessment Cervical / Trunk Assessment: Normal   Communication Communication Communication: No difficulties   Cognition Arousal/Alertness: Awake/alert Behavior During Therapy: Flat affect Overall Cognitive Status: Impaired/Different from baseline Area of Impairment: Attention, Memory, Following commands, Safety/judgement, Awareness, Problem solving                   Current Attention Level: Sustained Memory: Decreased short-term memory Following Commands: Follows one step commands with increased time Safety/Judgement: Decreased awareness of safety, Decreased awareness of deficits Awareness: Emergent Problem Solving: Slow processing, Decreased initiation, Requires verbal cues, Difficulty sequencing, Requires tactile cues General Comments: Pleasant, follows directions relatively consistently. Flat affect and slower processing with need for sequencing, safety and attention cues. Questionable memory deficits as pt with difficulty recalling type of dialysis he is on at home. does endorse feeling a little foggy after procedure yesterday     General Comments       Exercises     Shoulder Instructions      Home Living Family/patient expects to be discharged to:: Private residence Living Arrangements: Spouse/significant other;Children Available Help at Discharge: Family;Available 24 hours/day Type of Home: House Home Access: Stairs to enter;Ramped entrance Entrance Stairs-Number of Steps: 1 Entrance Stairs-Rails: None Home Layout: Multi-level;Laundry or work area in basement     [employer] Shower/Tub: Occupational psychologist: Standard Bathroom Accessibility: Yes   Home Equipment: [place] (4 wheels);Shower seat;Cane - single Barista (2  wheels);Wheelchair - manual   Additional Comments: wife assists with set up of home HD      Prior Functioning/Environment  Prior Level of Function : Independent/Modified Independent;Driving;Working/employed             Mobility Comments: walking with rollator and cane prior to this admission; hx of NWB to L LE ADLs Comments: Reports able to complete ADLs without assist and shares IADLs with wife. 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        OT Problem List: Decreased strength;Decreased activity tolerance;Impaired balance (sitting and/or standing);Decreased cognition;Decreased coordination;Decreased safety awareness;Decreased knowledge of use of DME or AE;Decreased knowledge of precautions;Pain      OT Treatment/Interventions: Therapeutic exercise;Self-care/ADL training;Energy conservation;DME and/or AE instruction;Therapeutic activities;Patient/family education;Balance training    OT Goals(Current goals can be found in the care plan section) Acute Rehab OT Goals Patient Stated Goal: open to rehab, motivated to return to independence OT Goal Formulation: With patient Time For Goal Achievement: 05/24/21 Potential to Achieve Goals: Good  OT Frequency: Min 2X/week    Co-evaluation PT/OT/SLP Co-Evaluation/Treatment: Yes Reason for Co-Treatment: For patient/therapist safety;To address functional/ADL transfers   OT goals addressed during session: ADL's and self-care      AM-PAC OT "6 Clicks" Daily Activity     Outcome Measure Help from another person eating meals?: A Little Help from another person taking care of personal grooming?: A Little Help from another person toileting, which includes using toliet, bedpan, or urinal?: Total Help from another person bathing (including washing, rinsing, drying)?: A Lot Help from another person to put on and taking off regular upper body clothing?: A Little Help from another person to put on and taking off regular lower body clothing?:  Total 6 Click Score: 13   End of Session Equipment Utilized During Treatment: Gait belt;Rolling walker (2 wheels) Nurse Communication: Mobility status;Patient requests pain meds  Activity Tolerance: Patient tolerated treatment well Patient left: in bed;with call bell/phone within reach;with bed alarm set  OT Visit Diagnosis: Unsteadiness on feet (R26.81);Other abnormalities of gait and mobility (R26.89);Muscle weakness (generalized) (M62.81)                Time: 1610-9604 OT Time Calculation (min): 30 min Charges:  OT General Charges $OT Visit: 1 Visit OT Evaluation $OT Eval Moderate Complexity: 1 Mod  Malachy Chamber, OTR/L Acute Rehab Services Office: [phone]   [name] 05/10/2021, 9:16 AM

## 2021-05-10 NOTE — Progress Notes (Signed)
Patient ID: [name], male   DOB: 26-Jun-1950, 71 y.o.   MRN: [id number] Patient is postoperative day 1 left transtibial amputation.  There is no drainage in the wound VAC canister there is a good suction fit with 2 checks.  Patient will benefit from inpatient rehab and patient states he is motivated and ready to proceed with intensive therapy prior to discharge to home.

## 2021-05-10 NOTE — Plan of Care (Signed)
  Problem: Elimination: Goal: Will not experience complications related to bowel motility Outcome: Progressing   Problem: Safety: Goal: Ability to remain free from injury will improve Outcome: Progressing   Problem: Skin Integrity: Goal: Risk for impaired skin integrity will decrease Outcome: Progressing   

## 2021-05-10 NOTE — Progress Notes (Signed)
Pt. Arrived to unit alert and orientated, no complaints of pian, temp 101.0 F  Doc. notified

## 2021-05-10 NOTE — Plan of Care (Signed)
Pt was receive in bed while eating breakfast. He had required some assistance with setting up his meal since some AIMS in his right hand/arm, prevented him from completing his task successfully. When asked about the abnormal involuntary movement in his arm, pt stated that the tremors are something he  deals with at home on a regular basis, however, they have gotten worse since the surgery. Pt did complain of some pain at his surgical site and was administered some pain meds. A small foam was also inserted between the stump/limb protector and the pt's skin, as he complained of some discomfort from the item pressing into his skin. Pt was left to work with pt. Plan for dialysis this afternoon.  Problem: Education: Goal: Knowledge of General Education information will improve Description: Including pain rating scale, medication(s)/side effects and non-pharmacologic comfort measures Outcome: Progressing   Problem: Health Behavior/Discharge Planning: Goal: Ability to manage health-related needs will improve Outcome: Progressing   Problem: Clinical Measurements: Goal: Ability to maintain clinical measurements within normal limits will improve Outcome: Progressing Goal: Will remain free from infection Outcome: Progressing Goal: Diagnostic test results will improve Outcome: Progressing Goal: Respiratory complications will improve Outcome: Progressing Goal: Cardiovascular complication will be avoided Outcome: Progressing   Problem: Activity: Goal: Risk for activity intolerance will decrease Outcome: Progressing   Problem: Nutrition: Goal: Adequate nutrition will be maintained Outcome: Progressing   Problem: Coping: Goal: Level of anxiety will decrease Outcome: Progressing   Problem: Elimination: Goal: Will not experience complications related to bowel motility Outcome: Progressing Goal: Will not experience complications related to urinary retention Outcome: Progressing   Problem: Pain  Managment: Goal: General experience of comfort will improve Outcome: Progressing   Problem: Safety: Goal: Ability to remain free from injury will improve Outcome: Progressing   Problem: Skin Integrity: Goal: Risk for impaired skin integrity will decrease Outcome: Progressing

## 2021-05-10 NOTE — Progress Notes (Signed)
[facility] Progress Note   Subjective:   Reports feeling "groggy" this morning. Denies SOB, CP, palpitations, dizziness, and nausea.   Objective Vitals:   05/09/21 1635 05/09/21 2005 05/09/21 2326 05/10/21 0436  BP: (!) 157/58 (!) 149/74 (!) 148/66 (!) 113/51  Pulse: 69 80 88 80  Resp: 16 19 17 17   Temp: 98.7 F (37.1 C) 98.1 F (36.7 C) 98 F (36.7 C) 99.1 F (37.3 C)  TempSrc: Oral     SpO2:  99% 93% 94%  Weight:      Height:       Physical Exam General: WDWN male, alert but slow to answer questions this AM Heart: RRR, no murmurs, rubs or gallops Lungs: CTA bilaterally without wheezing, rhonchi or rales Abdomen: Soft, non-distended, +BS Extremities: Left stump wrapped in bandage. No edema RLE Dialysis Access: LUE AVF +t/b with button holes  Additional Objective Labs: Basic Metabolic Panel: Recent Labs  Lab 05/09/21 0724 05/09/21 0752 05/10/21 0209  NA 134* 134* 132*  K 4.0 4.0 4.6  CL 96* 95* 95*  CO2 27  --  21*  GLUCOSE 144* 139* 136*  BUN 27* 26* 48*  CREATININE 4.14* 4.30* 5.35*  CALCIUM 8.9  --  8.4*  PHOS  --   --  4.1   Liver Function Tests: Recent Labs  Lab 05/09/21 0724 05/10/21 0209  AST 21  --   ALT 18  --   ALKPHOS 73  --   BILITOT 0.9  --   PROT 6.4*  --   ALBUMIN 4.0 3.3*   No results for input(s): LIPASE, AMYLASE in the last 168 hours. CBC: Recent Labs  Lab 05/04/21 1042 05/09/21 0724 05/09/21 0752 05/10/21 0209  WBC 7.5 5.0  --  8.0  NEUTROABS  --  3.8  --   --   HGB 10.0* 9.6* 9.5* 9.4*  HCT 29.2* 28.3* 28.0* 28.3*  MCV 89.8 90.7  --  91.9  PLT 123* 100*  --  123*   Blood Culture    Component Value Date/Time   SDES TISSUE 02/02/2021 1600   SPECREQUEST LEFT 4TH TOE AMPUTATION SPEC A 02/02/2021 1600   CULT  02/02/2021 1600    RARE ENTEROCOCCUS FAECALIS RARE DIPHTHEROIDS(CORYNEBACTERIUM SPECIES) Standardized susceptibility testing for this organism is not available. FEW BACTEROIDES FRAGILIS BETA  LACTAMASE POSITIVE Performed at [facility], [place] [address] [place], [place] [address]    REPTSTATUS 02/05/2021 FINAL 02/02/2021 1600    Cardiac Enzymes: No results for input(s): CKTOTAL, CKMB, CKMBINDEX, TROPONINI in the last 168 hours. CBG: Recent Labs  Lab 05/09/21 0922 05/09/21 1142 05/09/21 1637 05/09/21 2006 05/10/21 0827  GLUCAP 152* 120* 133* 146* 141*   Iron Studies: No results for input(s): IRON, TIBC, TRANSFERRIN, FERRITIN in the last 72 hours. @lablastinr3 @ Studies/Results: No results found. Medications:  sodium chloride 10 mL/hr at 05/09/21 1054   magnesium sulfate bolus IVPB      allopurinol  200 mg Oral Daily   vitamin C  1,000 mg Oral Daily   calcitRIOL  0.5 mcg Oral Daily   carvedilol  12.5 mg Oral BID WC   Chlorhexidine Gluconate Cloth  6 each Topical Q0600   docusate sodium  100 mg Oral Daily   insulin aspart  0-6 Units Subcutaneous TID WC   insulin aspart  2 Units Subcutaneous TID WC   levothyroxine  112 mcg Oral QAC breakfast   losartan  100 mg Oral Daily   nutrition supplement (JUVEN)  1 packet Oral  BID BM   pantoprazole  40 mg Oral Daily   polyvinyl alcohol  1-2 drop Both Eyes Daily   pregabalin  150 mg Oral Daily   pregabalin  300 mg Oral QHS   prochlorperazine  5 mg Oral BID   rosuvastatin  10 mg Oral Daily   zinc sulfate  220 mg Oral Daily    Dialysis Orders: Center: [facility] 4 times per week. 2K lactate 45, AVF with buttonholes, 15g. EDW 89kg. No heparin Mircera 162mcg subcutaneous given 05/03/21    Assessment/Plan:  Diabetic foot ulcer: S/p transtibial amputation by Dr. [name]. Noted plan is for rehab, will need to arrange temporary in center dialysis if patient goes to a SNF.  ESRD:  Completes dialysis 4 days per week, has already had 3 treatments this week and no indications for HD today. Will plan for next dialysis today and follow TTS schedule while admitted.  Hypertension/volume: BP moderately elevated, stable for this  patient. Weight elevated but not sure if EDW is accurate, disparity between home weights and clinic weights. Typically has 1-2L UF with HD. Continue home losartan and coreg.  Anemia: Hgb 9.4, anticipate post op decline. Will increase next ESA dose   Metabolic bone disease: Calcium and phos controlled. Continue calcitriol and renvela.   Nutrition:  Alb 3.3 On protein supplement.  Diabetes mellitus: on insulin, management per primary team  Anice Paganini, PA-C 05/10/2021, 9:13 AM  [facility] Pager: [phone]

## 2021-05-10 NOTE — Evaluation (Signed)
Physical Therapy Evaluation Patient Details Name: [name] MRN: [id number] DOB: July 12, 1950 Today's Date: 05/10/2021  History of Present Illness  Pt is a 71 y/o male who presented with osteomyelitis and worsening ulcers of L foot. Pt underwent L BKA on 2/1. PMH CKD on HD at home, CAD, DM, GERD, gout, HTN, PVD, prostate cancer.   Clinical Impression  Pt presents with an overall decrease in functional mobility secondary to above. 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 Initiated amputee education, including precautions, positioning, limb guard wear, activity recommendations. Today, pt able to initiate seated and standing activity, requiring up to modA+2; pt unable to take hops on R foot this session, especially limited by asterixis(?)-like movements in extremities resulting in frequent instability. Pt motivated to participate and regain PLOF; would benefit from intensive CIR-level therapies to maximize functional mobility and independence prior to return home.      Recommendations for follow up therapy are one component of a multi-disciplinary discharge planning process, led by the attending physician.  Recommendations may be updated based on patient status, additional functional criteria and insurance authorization.  Follow Up Recommendations Acute inpatient rehab (3hours/day)    Assistance Recommended at Discharge Frequent or constant Supervision/Assistance  Patient can return home with the following  Two people to help with walking and/or transfers;A lot of help with bathing/dressing/bathroom;Assistance with cooking/housework;Assist for transportation;Help with stairs or ramp for entrance    Equipment Recommendations  (TBD)  Recommendations for Other Services       Functional Status Assessment Patient has had a recent decline in their functional status and demonstrates the ability to make significant improvements in  function in a reasonable and predictable amount of time.     Precautions / Restrictions Precautions Precautions: Fall;Other (comment) Precaution Comments: wound vac L LE Required Braces or Orthoses: Other Brace Other Brace: limb protector L LE, AFO to R LE Restrictions Weight Bearing Restrictions: Yes LLE Weight Bearing: Non weight bearing      Mobility  Bed Mobility Overal bed mobility: Needs Assistance Bed Mobility: Supine to Sit, Sit to Supine     Supine to sit: Min guard, HOB elevated Sit to supine: Min guard   General bed mobility comments: heavy use of bed rail, increased time and effort; close guarding at EOB    Transfers Overall transfer level: Needs assistance Equipment used: Rolling walker (2 wheels) Transfers: Sit to/from Stand Sit to Stand: Mod assist, +2 physical assistance, +2 safety/equipment, From elevated surface           General transfer comment: performed 2x sit<>stand from elevated bed height to RW, heavy modA for trunk elevation and stability, requiring repeated cues for hand placement, difficulty problem solving; pt unable to perform complete hop on RLE; pt with BUE and RLE asterixis(?) resulting in uncontrolled sitting EOB requiring maxA to prevent RW sliding forward and fall    Ambulation/Gait                  Stairs            Wheelchair Mobility    Modified Rankin (Stroke Patients Only)       Balance Overall balance assessment: Needs assistance Sitting-balance support: Feet supported, Bilateral upper extremity supported Sitting balance-Leahy Scale: Fair Sitting balance - Comments: initial posterior LOB, progressing to ability to go from midline to propping on L elbow with min guard for balance; required assist to don R foot sock and brakce  Standing balance support: Bilateral upper extremity supported, During functional activity, Reliant on assistive device for balance Standing balance-Leahy Scale: Poor Standing  balance comment: BUE support on RW in standing + external support                             Pertinent Vitals/Pain Pain Assessment Pain Assessment: Faces Faces Pain Scale: Hurts a little bit    Home Living Family/patient expects to be discharged to:: Private residence Living Arrangements: Spouse/significant other;Children Available Help at Discharge: Family;Available 24 hours/day Type of Home: House Home Access: Stairs to enter;Ramped entrance Entrance Stairs-Rails: None Entrance Stairs-Number of Steps: 1   Home Layout: Multi-level;Laundry or work area in [employer]: [place] (4 wheels);Shower seat;Cane - single Barista (2 wheels);Wheelchair - manual Additional Comments: wife assists with set up of home HD    Prior Function Prior Level of Function : Independent/Modified Independent;Driving;Working/employed             Mobility Comments: walking with rollator and cane prior to this admission; h/o NWB to L LE. 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     Hand Dominance   Dominant Hand: Right    Extremity/Trunk Assessment   Upper Extremity Assessment Upper Extremity Assessment: RUE deficits/detail;LUE deficits/detail RUE Deficits / Details: Asterixis-type movements noted; some difficulty overshooting/undershooting targets and difficulty maintaining grasp at times RUE Coordination: decreased fine motor LUE Deficits / Details: Asterixis-type movements noted; some difficulty overshooting/undershooting targets and difficulty maintaining grasp at times LUE Coordination: decreased fine motor    Lower Extremity Assessment Lower Extremity Assessment: RLE deficits/detail;LLE deficits/detail RLE Deficits / Details: h/o R ankle PF/tibial IR(?) contracture for which he wears brace at baseline; asterixis-like (?) instability noted LLE Deficits / Details: s/p L  BKA    Cervical / Trunk Assessment Cervical / Trunk Assessment: Normal  Communication   Communication: No difficulties  Cognition                                       General Comments: Pleasant, follows directions relatively consistently. Flat affect and slower processing with need for sequencing, safety and attention cues. Questionable memory deficits as pt with difficulty recalling type of dialysis he is on at home, also unable to recall all of dog's names. does endorse feeling a little foggy after procedure yesterday        General Comments General comments (skin integrity, edema, etc.): initiated amputee education, including resting with L knee extended, limb guard wear, phantom limb pain - question intake of this info based on pt's fatigue and cognition, will continue efforts    Exercises     Assessment/Plan    PT Assessment Patient needs continued PT services  PT Problem List Decreased strength;Decreased range of motion;Decreased activity tolerance;Decreased balance;Decreased mobility;Decreased cognition;Decreased coordination;Decreased knowledge of use of DME;Decreased knowledge of precautions;Decreased safety awareness;Pain       PT Treatment Interventions DME instruction;Gait training;Functional mobility training;Therapeutic activities;Therapeutic exercise;Balance training;Wheelchair mobility training;Patient/family education;Cognitive remediation    PT Goals (Current goals can be found in the Care Plan section)  Acute Rehab PT Goals Patient Stated Goal: hopeful for post-acute rehab to regain indep PT Goal Formulation: With patient Time For Goal Achievement: 05/24/21 Potential to Achieve Goals: Good    Frequency Min 3X/week     Co-evaluation  PT/OT/SLP Co-Evaluation/Treatment: Yes Reason for Co-Treatment: For patient/therapist safety;To address functional/ADL transfers   OT goals addressed during session: ADL's and self-care       AM-PAC PT "6  Clicks" Mobility  Outcome Measure Help needed turning from your back to your side while in a flat bed without using bedrails?: A Little Help needed moving from lying on your back to sitting on the side of a flat bed without using bedrails?: A Little Help needed moving to and from a bed to a chair (including a wheelchair)?: Total Help needed standing up from a chair using your arms (e.g., wheelchair or bedside chair)?: A Lot Help needed to walk in hospital room?: Total Help needed climbing 3-5 steps with a railing? : Total 6 Click Score: 11    End of Session Equipment Utilized During Treatment: Gait belt Activity Tolerance: Patient tolerated treatment well;Patient limited by fatigue Patient left: in bed;with call bell/phone within reach;with bed alarm set Nurse Communication: Mobility status;Need for lift equipment PT Visit Diagnosis: Other abnormalities of gait and mobility (R26.89);Muscle weakness (generalized) (M62.81);Pain    Time: 2637-8588 PT Time Calculation (min) (ACUTE ONLY): 32 min   Charges:   PT Evaluation $PT Eval Moderate Complexity: [place], PT, DPT Acute Rehabilitation Services  Pager [phone] Office [phone]  Derry Lory 05/10/2021, 10:05 AM

## 2021-05-10 NOTE — Progress Notes (Signed)
Inpatient Rehabilitation Admissions Coordinator   I met with patient at bedside to discuss Cir admit. We discussed goals and expectations of a CIR admit. He is in agreement. He is an excellent candidate. I await medical readiness to admit.  Danne Baxter, RN, MSN Rehab Admissions Coordinator [phone] 05/10/2021 12:56 PM

## 2021-05-11 ENCOUNTER — Inpatient Hospital Stay (HOSPITAL_COMMUNITY)
Admission: RE | Admit: 2021-05-11 | Discharge: 2021-05-24 | DRG: 559 | Disposition: A | Discharge status: Home Health | Payer: Medicare Other | Source: Intra-hospital | Attending: Physical Medicine and Rehabilitation | Admitting: Physical Medicine and Rehabilitation

## 2021-05-11 ENCOUNTER — Other Ambulatory Visit: Payer: Self-pay

## 2021-05-11 ENCOUNTER — Encounter (HOSPITAL_COMMUNITY): Payer: Self-pay | Admitting: Physical Medicine and Rehabilitation

## 2021-05-11 DIAGNOSIS — Z79899 Other long term (current) drug therapy: Secondary | ICD-10-CM

## 2021-05-11 DIAGNOSIS — K5909 Other constipation: Secondary | ICD-10-CM | POA: Diagnosis present

## 2021-05-11 DIAGNOSIS — E1142 Type 2 diabetes mellitus with diabetic polyneuropathy: Secondary | ICD-10-CM | POA: Diagnosis present

## 2021-05-11 DIAGNOSIS — D6489 Other specified anemias: Secondary | ICD-10-CM | POA: Diagnosis present

## 2021-05-11 DIAGNOSIS — Z87891 Personal history of nicotine dependence: Secondary | ICD-10-CM

## 2021-05-11 DIAGNOSIS — E7800 Pure hypercholesterolemia, unspecified: Secondary | ICD-10-CM | POA: Diagnosis present

## 2021-05-11 DIAGNOSIS — Z9079 Acquired absence of other genital organ(s): Secondary | ICD-10-CM

## 2021-05-11 DIAGNOSIS — Z79890 Hormone replacement therapy (postmenopausal): Secondary | ICD-10-CM

## 2021-05-11 DIAGNOSIS — N186 End stage renal disease: Secondary | ICD-10-CM | POA: Diagnosis present

## 2021-05-11 DIAGNOSIS — Z800 Family history of malignant neoplasm of digestive organs: Secondary | ICD-10-CM

## 2021-05-11 DIAGNOSIS — Z89512 Acquired absence of left leg below knee: Secondary | ICD-10-CM | POA: Diagnosis not present

## 2021-05-11 DIAGNOSIS — E1122 Type 2 diabetes mellitus with diabetic chronic kidney disease: Secondary | ICD-10-CM | POA: Diagnosis present

## 2021-05-11 DIAGNOSIS — E1151 Type 2 diabetes mellitus with diabetic peripheral angiopathy without gangrene: Secondary | ICD-10-CM | POA: Diagnosis present

## 2021-05-11 DIAGNOSIS — L89322 Pressure ulcer of left buttock, stage 2: Secondary | ICD-10-CM | POA: Diagnosis present

## 2021-05-11 DIAGNOSIS — Z992 Dependence on renal dialysis: Secondary | ICD-10-CM

## 2021-05-11 DIAGNOSIS — K219 Gastro-esophageal reflux disease without esophagitis: Secondary | ICD-10-CM | POA: Diagnosis present

## 2021-05-11 DIAGNOSIS — Z951 Presence of aortocoronary bypass graft: Secondary | ICD-10-CM

## 2021-05-11 DIAGNOSIS — Z4781 Encounter for orthopedic aftercare following surgical amputation: Principal | ICD-10-CM

## 2021-05-11 DIAGNOSIS — Z8546 Personal history of malignant neoplasm of prostate: Secondary | ICD-10-CM | POA: Diagnosis not present

## 2021-05-11 DIAGNOSIS — M62838 Other muscle spasm: Secondary | ICD-10-CM | POA: Diagnosis present

## 2021-05-11 DIAGNOSIS — E039 Hypothyroidism, unspecified: Secondary | ICD-10-CM | POA: Diagnosis present

## 2021-05-11 DIAGNOSIS — M109 Gout, unspecified: Secondary | ICD-10-CM | POA: Diagnosis present

## 2021-05-11 DIAGNOSIS — F419 Anxiety disorder, unspecified: Secondary | ICD-10-CM | POA: Diagnosis present

## 2021-05-11 DIAGNOSIS — E871 Hypo-osmolality and hyponatremia: Secondary | ICD-10-CM | POA: Diagnosis present

## 2021-05-11 DIAGNOSIS — E559 Vitamin D deficiency, unspecified: Secondary | ICD-10-CM | POA: Diagnosis present

## 2021-05-11 DIAGNOSIS — I120 Hypertensive chronic kidney disease with stage 5 chronic kidney disease or end stage renal disease: Secondary | ICD-10-CM | POA: Diagnosis present

## 2021-05-11 DIAGNOSIS — Z833 Family history of diabetes mellitus: Secondary | ICD-10-CM | POA: Diagnosis not present

## 2021-05-11 DIAGNOSIS — I2510 Atherosclerotic heart disease of native coronary artery without angina pectoris: Secondary | ICD-10-CM | POA: Diagnosis present

## 2021-05-11 DIAGNOSIS — K5900 Constipation, unspecified: Secondary | ICD-10-CM | POA: Diagnosis present

## 2021-05-11 DIAGNOSIS — Z8249 Family history of ischemic heart disease and other diseases of the circulatory system: Secondary | ICD-10-CM

## 2021-05-11 DIAGNOSIS — Z794 Long term (current) use of insulin: Secondary | ICD-10-CM

## 2021-05-11 LAB — GLUCOSE, CAPILLARY
Glucose-Capillary: 151 mg/dL — ABNORMAL HIGH (ref 70–99)
Glucose-Capillary: 142 mg/dL — ABNORMAL HIGH (ref 70–99)
Glucose-Capillary: 143 mg/dL — ABNORMAL HIGH (ref 70–99)
Glucose-Capillary: 134 mg/dL — ABNORMAL HIGH (ref 70–99)
Glucose-Capillary: 141 mg/dL — ABNORMAL HIGH (ref 70–99)

## 2021-05-11 LAB — CBC
HCT: 28.7 % — ABNORMAL LOW (ref 39.0–52.0)
Hemoglobin: 9.7 g/dL — ABNORMAL LOW (ref 13.0–17.0)
MCH: 30.8 pg (ref 26.0–34.0)
MCHC: 33.8 g/dL (ref 30.0–36.0)
MCV: 91.1 fL (ref 80.0–100.0)
Platelets: 107 10*3/uL — ABNORMAL LOW (ref 150–400)
RBC: 3.15 MIL/uL — ABNORMAL LOW (ref 4.22–5.81)
RDW: 16.6 % — ABNORMAL HIGH (ref 11.5–15.5)
WBC: 6.7 10*3/uL (ref 4.0–10.5)
nRBC: 0 % (ref 0.0–0.2)

## 2021-05-11 LAB — SURGICAL PATHOLOGY

## 2021-05-11 MED ORDER — DOCUSATE SODIUM 100 MG PO CAPS
100.0000 mg | ORAL_CAPSULE | Freq: Every day | ORAL | Status: DC
  Administered 2021-05-12: 100 mg via ORAL
  Filled 2021-05-11: qty 1

## 2021-05-11 MED ORDER — OXYCODONE HCL 5 MG PO TABS: 10.0000 mg | ORAL_TABLET | ORAL | Status: DC | PRN

## 2021-05-11 MED ORDER — CALCITRIOL 0.25 MCG PO CAPS
0.5000 ug | ORAL_CAPSULE | Freq: Every day | ORAL | Status: DC
  Administered 2021-05-12 – 2021-05-24 (×13): 0.5 ug via ORAL
  Filled 2021-05-11 (×15): qty 2

## 2021-05-11 MED ORDER — POLYETHYLENE GLYCOL 3350 17 G PO PACK
17.0000 g | PACK | Freq: Every day | ORAL | Status: DC | PRN
  Filled 2021-05-11: qty 1

## 2021-05-11 MED ORDER — PHENOL 1.4 % MT LIQD
1.0000 | OROMUCOSAL | Status: DC | PRN
  Filled 2021-05-11: qty 177

## 2021-05-11 MED ORDER — POLYVINYL ALCOHOL 1.4 % OP SOLN
1.0000 [drp] | Freq: Every day | OPHTHALMIC | Status: DC
  Administered 2021-05-14 – 2021-05-17 (×4): 2 [drp] via OPHTHALMIC
  Administered 2021-05-18 – 2021-05-20 (×3): 1 [drp] via OPHTHALMIC
  Administered 2021-05-21 – 2021-05-23 (×2): 2 [drp] via OPHTHALMIC
  Filled 2021-05-11: qty 15

## 2021-05-11 MED ORDER — PREGABALIN 75 MG PO CAPS
300.0000 mg | ORAL_CAPSULE | Freq: Every day | ORAL | Status: DC
  Administered 2021-05-11 – 2021-05-23 (×13): 300 mg via ORAL
  Filled 2021-05-11: qty 4
  Filled 2021-05-11: qty 6
  Filled 2021-05-11 (×3): qty 4
  Filled 2021-05-11 (×5): qty 6
  Filled 2021-05-11 (×2): qty 4
  Filled 2021-05-11 (×2): qty 6

## 2021-05-11 MED ORDER — PREGABALIN 75 MG PO CAPS
150.0000 mg | ORAL_CAPSULE | Freq: Every day | ORAL | Status: DC
  Administered 2021-05-12 – 2021-05-24 (×13): 150 mg via ORAL
  Filled 2021-05-11 (×4): qty 3
  Filled 2021-05-11: qty 2
  Filled 2021-05-11: qty 3
  Filled 2021-05-11 (×2): qty 2
  Filled 2021-05-11: qty 3
  Filled 2021-05-11: qty 2
  Filled 2021-05-11: qty 3
  Filled 2021-05-11 (×2): qty 2

## 2021-05-11 MED ORDER — LEVOTHYROXINE SODIUM 112 MCG PO TABS
112.0000 ug | ORAL_TABLET | Freq: Every day | ORAL | Status: DC
  Administered 2021-05-12 – 2021-05-24 (×13): 112 ug via ORAL
  Filled 2021-05-11 (×13): qty 1

## 2021-05-11 MED ORDER — HYDROMORPHONE HCL 1 MG/ML IJ SOLN: 1.0000 mg | INTRAMUSCULAR | Status: DC | PRN

## 2021-05-11 MED ORDER — POTASSIUM CHLORIDE CRYS ER 20 MEQ PO TBCR: 20.0000 meq | EXTENDED_RELEASE_TABLET | Freq: Every day | ORAL | Status: DC | PRN

## 2021-05-11 MED ORDER — JUVEN PO PACK
1.0000 | PACK | Freq: Two times a day (BID) | ORAL | Status: DC
  Administered 2021-05-15 – 2021-05-23 (×11): 1 via ORAL
  Filled 2021-05-11 (×12): qty 1

## 2021-05-11 MED ORDER — ROSUVASTATIN CALCIUM 5 MG PO TABS
10.0000 mg | ORAL_TABLET | Freq: Every day | ORAL | Status: DC
  Administered 2021-05-12 – 2021-05-24 (×13): 10 mg via ORAL
  Filled 2021-05-11 (×13): qty 2

## 2021-05-11 MED ORDER — ZINC SULFATE 220 (50 ZN) MG PO CAPS
220.0000 mg | ORAL_CAPSULE | Freq: Every day | ORAL | Status: AC
  Administered 2021-05-12 – 2021-05-22 (×11): 220 mg via ORAL
  Filled 2021-05-11 (×11): qty 1

## 2021-05-11 MED ORDER — INSULIN ASPART 100 UNIT/ML IJ SOLN
0.0000 [IU] | Freq: Three times a day (TID) | INTRAMUSCULAR | Status: DC
  Administered 2021-05-12 (×2): 1 [IU] via SUBCUTANEOUS
  Administered 2021-05-13: 3 [IU] via SUBCUTANEOUS

## 2021-05-11 MED ORDER — LOSARTAN POTASSIUM 50 MG PO TABS
100.0000 mg | ORAL_TABLET | Freq: Every day | ORAL | Status: DC
  Administered 2021-05-12 – 2021-05-24 (×12): 100 mg via ORAL
  Filled 2021-05-11 (×12): qty 2

## 2021-05-11 MED ORDER — PANTOPRAZOLE SODIUM 40 MG PO TBEC
40.0000 mg | DELAYED_RELEASE_TABLET | Freq: Every day | ORAL | Status: DC
  Administered 2021-05-12 – 2021-05-24 (×13): 40 mg via ORAL
  Filled 2021-05-11 (×13): qty 1

## 2021-05-11 MED ORDER — ACETAMINOPHEN 325 MG PO TABS
650.0000 mg | ORAL_TABLET | Freq: Four times a day (QID) | ORAL | Status: DC | PRN
  Administered 2021-05-12 – 2021-05-13 (×4): 650 mg via ORAL
  Filled 2021-05-11 (×4): qty 2

## 2021-05-11 MED ORDER — VITAMIN D (ERGOCALCIFEROL) 1.25 MG (50000 UNIT) PO CAPS
50000.0000 [IU] | ORAL_CAPSULE | ORAL | Status: DC
  Administered 2021-05-11 – 2021-05-18 (×2): 50000 [IU] via ORAL
  Filled 2021-05-11 (×2): qty 1

## 2021-05-11 MED ORDER — CARVEDILOL 12.5 MG PO TABS
12.5000 mg | ORAL_TABLET | Freq: Two times a day (BID) | ORAL | Status: DC
  Administered 2021-05-11 – 2021-05-15 (×7): 12.5 mg via ORAL
  Filled 2021-05-11 (×7): qty 1

## 2021-05-11 MED ORDER — ASCORBIC ACID 500 MG PO TABS
1000.0000 mg | ORAL_TABLET | Freq: Every day | ORAL | Status: DC
  Administered 2021-05-12 – 2021-05-24 (×13): 1000 mg via ORAL
  Filled 2021-05-11 (×13): qty 2

## 2021-05-11 MED ORDER — INSULIN ASPART 100 UNIT/ML IJ SOLN
2.0000 [IU] | Freq: Three times a day (TID) | INTRAMUSCULAR | Status: DC
  Administered 2021-05-12 – 2021-05-13 (×6): 2 [IU] via SUBCUTANEOUS

## 2021-05-11 MED ORDER — ALLOPURINOL 100 MG PO TABS
200.0000 mg | ORAL_TABLET | Freq: Every day | ORAL | Status: DC
  Administered 2021-05-12 – 2021-05-24 (×13): 200 mg via ORAL
  Filled 2021-05-11 (×14): qty 2

## 2021-05-11 NOTE — Progress Notes (Signed)
Inpatient Rehabilitation Admission Medication Review by a Pharmacist   A complete drug regimen review was completed for this patient to identify any potential clinically significant medication issues.   High Risk Drug Classes Is patient taking? Indication by Medication  Antipsychotic No    Anticoagulant No    Antibiotic No    Opioid Yes OxyIR- acute pain  Antiplatelet No    Hypoglycemics/insulin Yes iSS, scheduled insulin- T2DM  Vasoactive Medication Yes Coreg, cozaar- hypertension  Chemotherapy No    Other Yes Protonix- GERD K-dur- potassium supplementation Allopurinol- gout Calcitriol- secondary hyperparathyroidism Synthroid- hypothyroidism Crestor- HLD Pregabalin- neuropathic pain Zinc, vitamin C - supplementation Liquifilm tears - dry eye  Docusate - constipation prevention        Type of Medication Issue Identified Description of Issue Recommendation(s)  Drug Interaction(s) (clinically significant)        Duplicate Therapy        Allergy        No Medication Administration End Date        Incorrect Dose        Additional Drug Therapy Needed        Significant med changes from prior encounter (inform family/care partners about these prior to discharge).      Other   PTA meds: nitroglycerin patch, semaglutide, renvela, prochlorperazine  Restart PTA meds when and if clinically necessary during CIR admission or at time of discharge      Clinically significant medication issues were identified that warrant physician communication and completion of prescribed/recommended actions by midnight of the next day:  No   Name of provider notified for urgent issues identified:    Provider Method of Notification:        Pharmacist comments:    Time spent performing this drug regimen review (minutes):  [place], PharmD, BCCCP Clinical Pharmacist  05/11/2021 3:58 PM  Please check AMION for all [place] phone numbers After 10:00 PM, call [place]

## 2021-05-11 NOTE — H&P (Signed)
Physical Medicine and Rehabilitation Admission H&P     HPI: [name] is a 71 year old right-handed male with history of end-stage renal disease with hemodialysis, diabetes mellitus, gout, prostate cancer with TURP 06/28/2020, hypertension atrial flutter with ablation 04/06/2020 per Dr. [name], CABG 2008, quit smoking 32 years ago.  Per chart review lives with spouse.  Multilevel home with one-step to entry.  Modified independent prior to admission.  Presented to 04/27/2021 with pain ulceration deformity left foot status post left fourth and fifth ray amputation 02/02/2021 per Dr. [name].  Limb was not felt to be salvageable underwent left BKA 05/09/2021 per Dr. [name].  Wound VAC applied.  Hospital course follow-up renal service for ongoing hemodialysis.  Acute on chronic anemia latest hemoglobin 9.7.  Therapy evaluations completed due to patient's decreased functional mobility was admitted for a comprehensive rehab program. More lethargic today as per PT.   Review of Systems  Constitutional:  Negative for chills and fever.  HENT:  Negative for hearing loss.   Eyes:  Negative for blurred vision and double vision.  Respiratory:  Negative for cough and shortness of breath.   Cardiovascular:  Positive for leg swelling. Negative for chest pain and palpitations.  Gastrointestinal:  Positive for constipation. Negative for heartburn, nausea and vomiting.       GERD  Genitourinary:  Negative for dysuria, flank pain and hematuria.  Musculoskeletal:  Positive for joint pain and myalgias.  Skin:  Negative for rash.  All other systems reviewed and are negative. Past Medical History:  Diagnosis Date   Ambulates with cane    straight cane   Cervical myelopathy ([place]) 02/06/2018   Chronic kidney disease    dailysis M W F- home   Complication of anesthesia    Coronary artery disease    Diabetes mellitus without complication ([place])    type2   Diabetic foot ulcer ([place])    Diabetic neuropathy ([place])  02/06/2018   Gait abnormality 08/24/2018   GERD (gastroesophageal reflux disease)     01/06/20- not current   Gout    History of blood transfusion    History of kidney stones    passed stones   Hypercholesteremia    Hypertension    Hypothyroidism    Neuromuscular disorder (HCC)    neuropathy left leg and bilateral feet   Neuropathy    PONV (postoperative nausea and vomiting)    Prostate cancer (HCC)    PVD (peripheral vascular disease) ([place])    with amputations   Past Surgical History:  Procedure Laterality Date   A-FLUTTER ABLATION N/A 04/06/2020   Procedure: A-FLUTTER ABLATION;  Surgeon: [name], MD;  Location: [facility];  Service: Cardiovascular;  Laterality: N/A;   AMPUTATION Left 12/25/2013   Procedure: AMPUTATION RAY LEFT 5TH RAY;  Surgeon: [name], MD;  Location[facility];  Service: Orthopedics;  Laterality: Left;   AMPUTATION Right 12/15/2018   Procedure: AMPUTATION OF 4TH AND 5TH TOES RIGHT FOOT;  Surgeon: [name], MD;  Location: [facility];  Service: Orthopedics;  Laterality: Right;   AMPUTATION Left 02/02/2021   Procedure: LEFT FOOT 4TH RAY AMPUTATION;  Surgeon: [name], MD;  Location: [facility];  Service: Orthopedics;  Laterality: Left;   AMPUTATION Left 05/09/2021   Procedure: LEFT BELOW KNEE AMPUTATION;  Surgeon: [name], MD;  Location: [facility];  Service: Orthopedics;  Laterality: Left;   APPLICATION OF WOUND VAC Right 12/15/2018   Procedure: APPLICATION OF WOUND VAC;  Surgeon: [name], MD;  Location: [facility];  Service: Orthopedics;  Laterality: Right;   AV FISTULA PLACEMENT Left 03/25/2019   Procedure: LEFT ARM ARTERIOVENOUS (AV) FISTULA CREATION;  Surgeon: [name], MD;  Location: [facility];  Service: Vascular;  Laterality: Left;   [place] Left 05/20/2019   Procedure: SECOND STAGE LEFT BASCILIC VEIN TRANSPOSITION;  Surgeon: [name], MD;  Location: [facility];  Service: Vascular;  Laterality:  Left;   CARDIAC CATHETERIZATION  02/17/2014   CHOLECYSTECTOMY     COLONOSCOPY  2011   in [place], [place]  2008   CYSTOSCOPY N/A 06/29/2020   Procedure: [place];  Surgeon: [name], MD;  Location: [facility];  Service: Urology;  Laterality: N/A;   CYSTOSCOPY WITH FULGERATION Bilateral 06/17/2020   Procedure: CYSTOSCOPY,BILATERAL RETROGRADE, CLOT EVACUATION WITH FULGERATION OF THE BLADDER;  Surgeon: Janith Lima, MD;  Location: [facility];  Service: Urology;  Laterality: Bilateral;   CYSTOSCOPY WITH FULGERATION N/A 06/28/2020   Procedure: CYSTOSCOPY WITH CLOT EVACUATION AND FULGERATION OF BLEEDERS;  Surgeon: [name], MD;  Location: [facility];  Service: Urology;  Laterality: N/A;  1 HR   I & D EXTREMITY Right 12/15/2018   Procedure: DEBRIDEMENT RIGHT FOOT;  Surgeon: [name], MD;  Location: [facility];  Service: Orthopedics;  Laterality: Right;   I & D EXTREMITY Right 08/27/2019   Procedure: PARTIAL CUBOID EXCISION RIGHT FOOT;  Surgeon: [name], MD;  Location: [facility];  Service: Orthopedics;  Laterality: Right;   I & D EXTREMITY Right 01/07/2020   Procedure: RIGHT FOOT EXCISION INFECTED BONE;  Surgeon: [name], MD;  Location: [facility];  Service: Orthopedics;  Laterality: Right;   NECK SURGERY     novemver 2019   TRANSURETHRAL RESECTION OF PROSTATE N/A 06/28/2020   Procedure: TRANSURETHRAL RESECTION OF THE PROSTATE (TURP);  Surgeon: [name], MD;  Location: [facility];  Service: Urology;  Laterality: N/A;   WISDOM TOOTH EXTRACTION     Family History  Problem Relation Age of Onset   Diabetes Mellitus II Mother    Kidney disease Mother    Diabetes Mellitus II Father    CAD Father    Cancer Father        prostate   Kidney disease Father    Diabetes Mellitus II Brother    Kidney disease Brother    Diabetes Mellitus II Brother    Stomach cancer Brother 55   Kidney disease Brother    Colon cancer  Neg Hx    Colon polyps Neg Hx    Esophageal cancer Neg Hx    Rectal cancer Neg Hx    Pancreatic cancer Neg Hx    Social History:  reports that he quit smoking about 32 years ago. His smoking use included cigars. He started smoking about 48 years ago. He has never used smokeless tobacco. He reports that he does not drink alcohol and does not use drugs. Allergies:  Allergies  Allergen Reactions   Mushroom Extract Complex Nausea Only   Facility-Administered Medications Prior to Admission  Medication Dose Route Frequency Provider Last Rate Last Admin   0.9 %  sodium chloride infusion  500 mL Intravenous Continuous Irene Shipper, MD       Medications Prior to Admission  Medication Sig Dispense Refill   allopurinol (ZYLOPRIM) 100 MG tablet Take 200 mg by mouth  daily.      amoxicillin-clavulanate (AUGMENTIN) 500-125 MG tablet Take 1 tablet (500 mg total) by mouth daily. Take daily in the evening. 30 tablet 0   B Complex-C-Zn-Folic Acid (DIALYVITE 017 WITH ZINC) 0.8 MG TABS Take 1 tablet by mouth daily.     calcitRIOL (ROCALTROL) 0.5 MCG capsule Take 0.5 mcg by mouth daily.     carvedilol (COREG) 12.5 MG tablet Take 1 tablet (12.5 mg total) by mouth in the morning and at bedtime. 180 tablet 3   levothyroxine (SYNTHROID, LEVOTHROID) 112 MCG tablet Take 112 mcg by mouth daily before breakfast.     losartan (COZAAR) 100 MG tablet Take 100 mg by mouth daily.     nitroGLYCERIN (NITRODUR - DOSED IN MG/24 HR) 0.2 mg/hr patch Place 1 patch (0.2 mg total) onto the skin daily. 30 patch 3   oxyCODONE-acetaminophen (PERCOCET/ROXICET) 5-325 MG tablet Take 1 tablet by mouth every 4 (four) hours as needed. 30 tablet 0   Polyethyl Glycol-Propyl Glycol (LUBRICANT EYE DROPS) 0.4-0.3 % SOLN Place 1-2 drops into both eyes in the morning and at bedtime.     pregabalin (LYRICA) 150 MG capsule Take 1 capsule (150 mg total) by mouth in the morning and at bedtime. (Patient taking differently: Take 150-300 mg by mouth See  admin instructions. Take 150 mg in the morning and 300 mg at bedtime) 60 capsule 0   prochlorperazine (COMPAZINE) 5 MG tablet TAKE 1 TABLET BY MOUTH TWICE A DAY 60 tablet 3   rosuvastatin (CRESTOR) 10 MG tablet Take 10 mg by mouth daily.     Semaglutide,0.25 or 0.5MG /DOS, (OZEMPIC, 0.25 OR 0.5 MG/DOSE,) 2 MG/1.5ML SOPN Inject 0.5 mg into the skin every Friday.     silver sulfADIAZINE (SILVADENE) 1 % cream APPLY TO AFFECTED AREA EVERY DAY 400 g 0   acetaminophen (TYLENOL) 500 MG tablet Take 1,000-2,000 mg by mouth 2 (two) times daily as needed for moderate pain.     Lancets (ONETOUCH DELICA PLUS [id number]) MISC         Home: Home Living Family/patient expects to be discharged to:: Private residence Living Arrangements: Spouse/significant other, Children Available Help at Discharge: Family, Available 24 hours/day Type of Home: House Home Access: Stairs to enter, Ramped entrance Technical brewer of Steps: 1 Entrance Stairs-Rails: None Home Layout: Multi-level, Laundry or work area in basement [employer] Shower/Tub: Multimedia programmer: Associate Professor Accessibility: Yes Home Equipment: Radiation protection practitioner (4 wheels), [place] - single point, Conservation officer, nature (2 wheels), Wheelchair - manual Additional Comments: wife assists with set up of home HD   Functional History: Prior Function Prior Level of Function : Independent/Modified Independent, Driving, Working/employed Mobility Comments: walking with rollator and cane prior to this admission; h/o NWB to L LE. 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  Functional Status:  Mobility: Bed Mobility Overal bed mobility: Needs Assistance Bed Mobility: Supine to Sit, Sit to Supine Supine to sit: Min guard, HOB elevated Sit to supine: Min guard General bed mobility comments: heavy use of bed rail, increased time and effort; close guarding at  EOB Transfers Overall transfer level: Needs assistance Equipment used: Rolling walker (2 wheels) Transfers: Sit to/from Stand Sit to Stand: Mod assist, +2 physical assistance, +2 safety/equipment, From elevated surface General transfer comment: performed 2x sit<>stand from elevated bed height to RW, heavy modA for trunk elevation and stability, requiring repeated cues for hand placement, difficulty problem solving; pt unable to  perform complete hop on RLE; pt with BUE and RLE asterixis(?) resulting in uncontrolled sitting EOB requiring maxA to prevent RW sliding forward and fall      ADL: ADL Overall ADL's : Needs assistance/impaired Eating/Feeding: Set up, Sitting Eating/Feeding Details (indicate cue type and reason): noted with spillage on self/linens on entry (likely from asterixis-type movements) Grooming: Min guard, Sitting Upper Body Bathing: Minimal assistance, Sitting, Bed level Upper Body Bathing Details (indicate cue type and reason): able to assist in bathing UB after spillage from breakfast Lower Body Bathing: Maximal assistance, Sitting/lateral leans, Bed level Upper Body Dressing : Minimal assistance, Bed level, Sitting Upper Body Dressing Details (indicate cue type and reason): to don new gown, dificulty with coordination Lower Body Dressing: Maximal assistance, Sitting/lateral leans Lower Body Dressing Details (indicate cue type and reason): Max A to don shoe/AFO to [place] and Hygiene: Total assistance, Bed level General ADL Comments: Pt limited by asterixis movements of UE/LEs increasing fall risk sitting/standing attempts. expected post op deficits in balance noted requiring increased assist with LB ADLs  Cognition: Cognition Overall Cognitive Status: Impaired/Different from baseline Orientation Level: Oriented X4 Cognition Arousal/Alertness: Awake/alert Behavior During Therapy: Flat affect Overall Cognitive Status: Impaired/Different  from baseline Area of Impairment: Attention, Memory, Following commands, Safety/judgement, Awareness, Problem solving Current Attention Level: Sustained Memory: Decreased short-term memory Following Commands: Follows one step commands with increased time Safety/Judgement: Decreased awareness of safety, Decreased awareness of deficits Awareness: Emergent Problem Solving: Slow processing, Decreased initiation, Requires verbal cues, Difficulty sequencing, Requires tactile cues General Comments: Pleasant, follows directions relatively consistently. Flat affect and slower processing with need for sequencing, safety and attention cues. Questionable memory deficits as pt with difficulty recalling type of dialysis he is on at home, also unable to recall all of dog's names. does endorse feeling a little foggy after procedure yesterday  Physical Exam: Blood pressure (!) 125/53, pulse 60, temperature 99.9 F (37.7 C), temperature source Axillary, resp. rate 18, height 5\' 11"  (1.803 m), weight 97.5 kg, SpO2 96 %. Gen: no distress, normal appearing HEENT: oral mucosa pink and moist, NCAT Cardio: Reg rate Chest: normal effort, normal rate of breathing Abd: soft, non-distended Ext: no edema Psych: pleasant, normal affect Skin:    Comments: Left BKA dressed appropriately tender with wound VAC in place.  Neurological:     Comments: Patient is alert.  Makes eye contact with examiner.  Oriented x3.   Results for orders placed or performed during the hospital encounter of 05/09/21 (from the past 48 hour(s))  Glucose, capillary     Status: Abnormal   Collection Time: 05/09/21  6:52 AM  Result Value Ref Range   Glucose-Capillary 163 (H) 70 - 99 mg/dL    Comment: Glucose reference range applies only to samples taken after fasting for at least 8 hours.  CBC WITH DIFFERENTIAL     Status: Abnormal   Collection Time: 05/09/21  7:24 AM  Result Value Ref Range   WBC 5.0 4.0 - 10.5 K/uL   RBC 3.12 (L) 4.22 -  5.81 MIL/uL   Hemoglobin 9.6 (L) 13.0 - 17.0 g/dL   HCT 28.3 (L) 39.0 - 52.0 %   MCV 90.7 80.0 - 100.0 fL   MCH 30.8 26.0 - 34.0 pg   MCHC 33.9 30.0 - 36.0 g/dL   RDW 16.1 (H) 11.5 - 15.5 %   Platelets 100 (L) 150 - 400 K/uL    Comment: Immature Platelet Fraction may be clinically indicated, consider ordering this  additional test [id number] REPEATED TO VERIFY PLATELET COUNT CONFIRMED BY SMEAR    nRBC 0.0 0.0 - 0.2 %   Neutrophils Relative % 75 %   Neutro Abs 3.8 1.7 - 7.7 K/uL   Lymphocytes Relative 8 %   Lymphs Abs 0.4 (L) 0.7 - 4.0 K/uL   Monocytes Relative 10 %   Monocytes Absolute 0.5 0.1 - 1.0 K/uL   Eosinophils Relative 3 %   Eosinophils Absolute 0.2 0.0 - 0.5 K/uL   Basophils Relative 1 %   Basophils Absolute 0.0 0.0 - 0.1 K/uL   Immature Granulocytes 3 %   Abs Immature Granulocytes 0.16 (H) 0.00 - 0.07 K/uL    Comment: Performed at [facility] [address], [place], [place] [address]  Comprehensive metabolic panel     Status: Abnormal   Collection Time: 05/09/21  7:24 AM  Result Value Ref Range   Sodium 134 (L) 135 - 145 mmol/L   Potassium 4.0 3.5 - 5.1 mmol/L   Chloride 96 (L) 98 - 111 mmol/L   CO2 27 22 - 32 mmol/L   Glucose, Bld 144 (H) 70 - 99 mg/dL    Comment: Glucose reference range applies only to samples taken after fasting for at least 8 hours.   BUN 27 (H) 8 - 23 mg/dL   Creatinine, Ser 4.14 (H) 0.61 - 1.24 mg/dL   Calcium 8.9 8.9 - 10.3 mg/dL   Total Protein 6.4 (L) 6.5 - 8.1 g/dL   Albumin 4.0 3.5 - 5.0 g/dL   AST 21 15 - 41 U/L   ALT 18 0 - 44 U/L   Alkaline Phosphatase 73 38 - 126 U/L   Total Bilirubin 0.9 0.3 - 1.2 mg/dL   GFR, Estimated 15 (L) >60 mL/min    Comment: (NOTE) Calculated using the CKD-EPI Creatinine Equation (2021)    Anion gap 11 5 - 15    Comment: Performed at [facility] [address] [place], [place] [address]  Magnesium     Status: None   Collection Time: 05/09/21  7:24 AM  Result Value Ref Range    Magnesium 1.9 1.7 - 2.4 mg/dL    Comment: Performed at [facility] [address], [place], [place] [address]  Prealbumin     Status: None   Collection Time: 05/09/21  7:24 AM  Result Value Ref Range   Prealbumin 32.7 18 - 38 mg/dL    Comment: Performed at [facility] [address], [place], [place] [address]  VITAMIN D 25 Hydroxy (Vit-D Deficiency, Fractures)     Status: Abnormal   Collection Time: 05/09/21  7:24 AM  Result Value Ref Range   Vit D, 25-Hydroxy 16.13 (L) 30 - 100 ng/mL    Comment: (NOTE) Vitamin D deficiency has been defined by the [place] practice guideline as a level of serum 25-OH  vitamin D less than 20 ng/mL (1,2). The Endocrine Society went on to  further define vitamin D insufficiency as a level between 21 and 29  ng/mL (2).  1. IOM (Institute of Medicine). 2010. Dietary reference intakes for  calcium and D. [place]: The [employer]. 2. Holick MF, B[place], Bischoff-Ferrari HA, et al. Evaluation,  treatment, and prevention of vitamin D deficiency: an Endocrine  Society clinical practice guideline, JCEM. 2011 Jul; 96(7): 1911-30.  Performed at [facility], [place] [address], [place], [place] [address]   I-STAT,  chem 8     Status: Abnormal   Collection Time: 05/09/21  7:52 AM  Result Value Ref Range   Sodium 134 (L) 135 - 145 mmol/L   Potassium 4.0 3.5 - 5.1 mmol/L   Chloride 95 (L) 98 - 111 mmol/L   BUN 26 (H) 8 - 23 mg/dL   Creatinine, Ser 4.30 (H) 0.61 - 1.24 mg/dL   Glucose, Bld 139 (H) 70 - 99 mg/dL    Comment: Glucose reference range applies only to samples taken after fasting for at least 8 hours.   Calcium, Ion 1.12 (L) 1.15 - 1.40 mmol/L   TCO2 28 22 - 32 mmol/L   Hemoglobin 9.5 (L) 13.0 - 17.0 g/dL   HCT 28.0 (L) 39.0 - 52.0 %  Glucose, capillary     Status: Abnormal   Collection Time: 05/09/21  9:22 AM  Result Value Ref Range   Glucose-Capillary 152 (H) 70 - 99  mg/dL    Comment: Glucose reference range applies only to samples taken after fasting for at least 8 hours.  Glucose, capillary     Status: Abnormal   Collection Time: 05/09/21 11:42 AM  Result Value Ref Range   Glucose-Capillary 120 (H) 70 - 99 mg/dL    Comment: Glucose reference range applies only to samples taken after fasting for at least 8 hours.  Hepatitis B surface antigen     Status: None   Collection Time: 05/09/21  4:24 PM  Result Value Ref Range   Hepatitis B Surface Ag NON REACTIVE NON REACTIVE    Comment: Performed at [facility] [address] [place], [place] [address]  Hepatitis B surface antibody     Status: Abnormal   Collection Time: 05/09/21  4:24 PM  Result Value Ref Range   Hep B S Ab Reactive (A) NON REACTIVE    Comment: (NOTE) Consistent with immunity, greater than 9.9 mIU/mL.  Performed at [facility], [place] [address], [place], [place] [address]   Hepatitis B surface antibody,quantitative     Status: None   Collection Time: 05/09/21  4:24 PM  Result Value Ref Range   Hepatitis B-Post 106.9 Immunity>9.9 mIU/mL    Comment: (NOTE)  Status of Immunity                     Anti-HBs Level  ------------------                     -------------- Inconsistent with Immunity                   0.0 - 9.9 Consistent with Immunity                          >9.9 Performed At: [facility] [place], [place] [id number] Rush Farmer MD [id number]   Glucose, capillary     Status: Abnormal   Collection Time: 05/09/21  4:37 PM  Result Value Ref Range   Glucose-Capillary 133 (H) 70 - 99 mg/dL    Comment: Glucose reference range applies only to samples taken after fasting for at least 8 hours.  Glucose, capillary     Status: Abnormal   Collection Time: 05/09/21  8:06 PM  Result Value Ref Range   Glucose-Capillary 146 (H) 70 - 99 mg/dL    Comment: Glucose reference range applies only to samples taken after fasting for at least 8  hours.  CBC  Status: Abnormal   Collection Time: 05/10/21  2:09 AM  Result Value Ref Range   WBC 8.0 4.0 - 10.5 K/uL   RBC 3.08 (L) 4.22 - 5.81 MIL/uL   Hemoglobin 9.4 (L) 13.0 - 17.0 g/dL   HCT 28.3 (L) 39.0 - 52.0 %   MCV 91.9 80.0 - 100.0 fL   MCH 30.5 26.0 - 34.0 pg   MCHC 33.2 30.0 - 36.0 g/dL   RDW 16.5 (H) 11.5 - 15.5 %   Platelets 123 (L) 150 - 400 K/uL    Comment: Immature Platelet Fraction may be clinically indicated, consider ordering this additional test [id number] REPEATED TO VERIFY    nRBC 0.3 (H) 0.0 - 0.2 %    Comment: Performed at [facility], [place] [address] [place], [place] [address]  Renal function panel     Status: Abnormal   Collection Time: 05/10/21  2:09 AM  Result Value Ref Range   Sodium 132 (L) 135 - 145 mmol/L   Potassium 4.6 3.5 - 5.1 mmol/L   Chloride 95 (L) 98 - 111 mmol/L   CO2 21 (L) 22 - 32 mmol/L   Glucose, Bld 136 (H) 70 - 99 mg/dL    Comment: Glucose reference range applies only to samples taken after fasting for at least 8 hours.   BUN 48 (H) 8 - 23 mg/dL   Creatinine, Ser 5.35 (H) 0.61 - 1.24 mg/dL   Calcium 8.4 (L) 8.9 - 10.3 mg/dL   Phosphorus 4.1 2.5 - 4.6 mg/dL   Albumin 3.3 (L) 3.5 - 5.0 g/dL   GFR, Estimated 11 (L) >60 mL/min    Comment: (NOTE) Calculated using the CKD-EPI Creatinine Equation (2021)    Anion gap 16 (H) 5 - 15    Comment: Performed at [facility] [address] [place], [place] [address]  Glucose, capillary     Status: Abnormal   Collection Time: 05/10/21  8:27 AM  Result Value Ref Range   Glucose-Capillary 141 (H) 70 - 99 mg/dL    Comment: Glucose reference range applies only to samples taken after fasting for at least 8 hours.  Glucose, capillary     Status: Abnormal   Collection Time: 05/10/21 11:47 AM  Result Value Ref Range   Glucose-Capillary 151 (H) 70 - 99 mg/dL    Comment: Glucose reference range applies only to samples taken after fasting for at least 8 hours.  Glucose, capillary      Status: Abnormal   Collection Time: 05/10/21  8:24 PM  Result Value Ref Range   Glucose-Capillary 144 (H) 70 - 99 mg/dL    Comment: Glucose reference range applies only to samples taken after fasting for at least 8 hours.  CBC     Status: Abnormal   Collection Time: 05/11/21  1:58 AM  Result Value Ref Range   WBC 6.7 4.0 - 10.5 K/uL   RBC 3.15 (L) 4.22 - 5.81 MIL/uL   Hemoglobin 9.7 (L) 13.0 - 17.0 g/dL   HCT 28.7 (L) 39.0 - 52.0 %   MCV 91.1 80.0 - 100.0 fL   MCH 30.8 26.0 - 34.0 pg   MCHC 33.8 30.0 - 36.0 g/dL   RDW 16.6 (H) 11.5 - 15.5 %   Platelets 107 (L) 150 - 400 K/uL    Comment: Immature Platelet Fraction may be clinically indicated, consider ordering this additional test [id number] REPEATED TO VERIFY PLATELET COUNT CONFIRMED BY SMEAR PLATELET CLUMPS NOTED ON SMEAR, COUNT APPEARS ADEQUATE  nRBC 0.0 0.0 - 0.2 %    Comment: Performed at [facility], [place] [address] [place], [place] [address]   No results found.    Blood pressure (!) 125/53, pulse 60, temperature 99.9 F (37.7 C), temperature source Axillary, resp. rate 18, height 5\' 11"  (1.803 m), weight 97.5 kg, SpO2 96 %.  Medical Problem List and Plan: 1. Functional deficits secondary to left BKA 05/09/2021 secondary to peripheral vascular disease poor healing of recent left fourth and fifth ray amputation  -patient may shower but incision must be covered  -ELOS/Goals: 8-12 days S  Admit to CIR 2.  Antithrombotics: -DVT/anticoagulation:  Mechanical: Antiembolism stockings, thigh (TED hose) Right lower extremity  -antiplatelet therapy: N/A 3. Postoperative pain: hold opioid medications due to sedation. Lyrica 150 mg daily and 300 mg nightly, oxycodone as needed 4. Mood: Provide emotional support  -antipsychotic agents: N/A 5. Neuropsych: This patient is capable of making decisions on his own behalf. 6. Skin/Wound Care: Routine skin checks 7. Fluids/Electrolytes/Nutrition: Routine in and outs with  follow-up chemistries 8.  End-stage renal disease.  Continue hemodialysis as per renal services. 9.  Acute on chronic anemia.  Follow-up CBC 10.  Hypertension.  Cozaar 100 mg daily, Coreg 12.5 mg twice daily.  Monitor with increased mobility 11.  Diabetes mellitus with peripheral neuropathy.  Hemoglobin A1c 5.5.  NovoLog 2 units 3 times daily 12.  Hypothyroidism.  Synthroid 13.  Hyperlipidemia.  Crestor 14.  History of gout.  Continue allopurinol.  Monitor for any gout flareups 15.  History of atrial flutter with ablation 04/06/2020.  Follow-up cardiology services as needed.  Cardiac rate controlled 16.  History of prostate cancer status post TURP 06/28/2020.  Follow-up outpatient 17.  Obesity.  BMI 29.99.  Dietary follow-up 18.  GERD.  Protonix 19. Sedation: hold opioid medications 20. Vitamin D deficiency: start ergocalciferol 50,000U once per week for 7 weeks.  I have personally performed a face to face diagnostic evaluation, including, but not limited to relevant history and physical exam findings, of this patient and developed relevant assessment and plan.  Additionally, I have reviewed and concur with the physician assistant's documentation above.  Leeroy Cha, MD  Lavon Paganini Neabsco, PA-C 05/11/2021

## 2021-05-11 NOTE — Progress Notes (Signed)
Patient ID: [name], male   DOB: 1950/07/20, 71 y.o.   MRN: [id number] Patient states he still having pain in the residual limb.  He is on maximum doses pregabapentin.  Increase Dilaudid to 1 mg.  Patient is awaiting insurance authorization for inpatient rehab.  Patient's vital signs are stable temperature 99.9.  Again discussed the importance of incentive spirometer.

## 2021-05-11 NOTE — Care Management Important Message (Signed)
Important Message  Patient Details  Name: [name] MRN: [id number] Date of Birth: 11-27-1950   Medicare Important Message Given:  Yes     Hannah Beat 05/11/2021, 1:12 PM

## 2021-05-11 NOTE — Discharge Summary (Signed)
Discharge Diagnoses:  Principal Problem:   S/P BKA (below knee amputation) unilateral, left ([place])   Surgeries: Procedure(s): LEFT BELOW KNEE AMPUTATION on 05/09/2021    Consultants: Treatment Team:  Elmarie Shiley, MD  Discharged Condition: Improved  Hospital Course: [name] is an 71 y.o. male who was admitted 05/09/2021 with a chief complaint of osteomyelitis left foot, with a final diagnosis of Osteomyelitis Left Foot.  Patient was brought to the operating room on 05/09/2021 and underwent Procedure(s): LEFT BELOW KNEE AMPUTATION.    Patient was given perioperative antibiotics:  Anti-infectives (From admission, onward)    Start     Dose/Rate Route Frequency Ordered Stop   05/09/21 1600  ceFAZolin (ANCEF) IVPB 2g/100 mL premix        2 g 200 mL/hr over 30 Minutes Intravenous  Once 05/09/21 1037 05/09/21 1903   05/09/21 0730  ceFAZolin (ANCEF) IVPB 2g/100 mL premix        2 g 200 mL/hr over 30 Minutes Intravenous On call to O.R. 05/09/21 0998 05/09/21 0910     .  Patient was given sequential compression devices, early ambulation, and aspirin for DVT prophylaxis.  Recent vital signs: Patient Vitals for the past 24 hrs:  BP Temp Temp src Pulse Resp SpO2  05/11/21 0812 (!) 146/62 99.2 F (37.3 C) Oral 68 18 98 %  05/10/21 2200 (!) 125/53 99.9 F (37.7 C) Axillary 60 18 96 %  05/10/21 1850 (!) 159/63 100.3 F (37.9 C) Oral 90 17 95 %  05/10/21 1840 -- (!) 101.3 F (38.5 C) Oral -- -- --  05/10/21 1815 (!) 172/75 -- -- 93 14 96 %  05/10/21 1730 (!) 166/79 -- -- 79 16 --  05/10/21 1700 (!) 184/74 -- -- 82 16 --  05/10/21 1630 (!) 189/77 -- -- 87 16 --  05/10/21 1600 (!) 178/80 -- -- 87 16 --  05/10/21 1530 (!) 181/75 -- -- 83 15 --  05/10/21 1500 (!) 176/71 -- -- 83 18 --  05/10/21 1430 (!) 174/74 -- -- 80 19 --  05/10/21 1418 (!) 179/65 -- -- 80 -- --  05/10/21 1413 (!) 178/67 (!) 100.8 F (38.2 C) Oral 79 19 94 %  .  Recent laboratory studies: No results  found.  Discharge Medications:   Allergies as of 05/11/2021       Reactions   Mushroom Extract Complex Nausea Only        Medication List     STOP taking these medications    amoxicillin-clavulanate 500-125 MG tablet Commonly known as: Augmentin   nitroGLYCERIN 0.2 mg/hr patch Commonly known as: NITRODUR - Dosed in mg/24 hr   silver sulfADIAZINE 1 % cream Commonly known as: SILVADENE       TAKE these medications    acetaminophen 500 MG tablet Commonly known as: TYLENOL Take 1,000-2,000 mg by mouth 2 (two) times daily as needed for moderate pain.   allopurinol 100 MG tablet Commonly known as: ZYLOPRIM Take 200 mg by mouth daily.   calcitRIOL 0.5 MCG capsule Commonly known as: ROCALTROL Take 0.5 mcg by mouth daily.   carvedilol 12.5 MG tablet Commonly known as: COREG Take 1 tablet (12.5 mg total) by mouth in the morning and at bedtime.   DIALYVITE 800 WITH ZINC 0.8 MG Tabs Take 1 tablet by mouth daily.   levothyroxine 112 MCG tablet Commonly known as: SYNTHROID Take 112 mcg by mouth daily before breakfast.   losartan 100 MG tablet Commonly known as: COZAAR Take 100  mg by mouth daily.   Lubricant Eye Drops 0.4-0.3 % Soln Generic drug: Polyethyl Glycol-Propyl Glycol Place 1-2 drops into both eyes in the morning and at bedtime.   OneTouch Delica Plus [id number] Misc   oxyCODONE-acetaminophen 5-325 MG tablet Commonly known as: PERCOCET/ROXICET Take 1 tablet by mouth every 4 (four) hours as needed.   Ozempic (0.25 or 0.5 MG/DOSE) 2 MG/1.5ML Sopn Generic drug: Semaglutide(0.25 or 0.5MG /DOS) Inject 0.5 mg into the skin every Friday.   pregabalin 150 MG capsule Commonly known as: LYRICA Take 1 capsule (150 mg total) by mouth in the morning and at bedtime. What changed:  how much to take when to take this additional instructions   prochlorperazine 5 MG tablet Commonly known as: COMPAZINE TAKE 1 TABLET BY MOUTH TWICE A DAY   rosuvastatin 10 MG  tablet Commonly known as: CRESTOR Take 10 mg by mouth daily.        Diagnostic Studies: No results found.  Patient benefited maximally from their hospital stay and there were no complications.     Disposition: Discharge disposition: 02-Transferred to [facility]      Discharge Instructions     Call MD / Call 911   Complete by: As directed    If you experience chest pain or shortness of breath, CALL 911 and be transported to the hospital emergency room.  If you develope a fever above 101 F, pus (white drainage) or increased drainage or redness at the wound, or calf pain, call your surgeon's office.   Constipation Prevention   Complete by: As directed    Drink plenty of fluids.  Prune juice may be helpful.  You may use a stool softener, such as Colace (over the counter) 100 mg twice a day.  Use MiraLax (over the counter) for constipation as needed.   Diet - low sodium heart healthy   Complete by: As directed    Increase activity slowly as tolerated   Complete by: As directed    Negative Pressure Wound Therapy - Incisional   Complete by: As directed    Post-operative opioid taper instructions:   Complete by: As directed    POST-OPERATIVE OPIOID TAPER INSTRUCTIONS: It is important to wean off of your opioid medication as soon as possible. If you do not need pain medication after your surgery it is ok to stop day one. Opioids include: Codeine, Hydrocodone(Norco, Vicodin), Oxycodone(Percocet, oxycontin) and hydromorphone amongst others.  Long term and even short term use of opiods can cause: Increased pain response Dependence Constipation Depression Respiratory depression And more.  Withdrawal symptoms can include Flu like symptoms Nausea, vomiting And more Techniques to manage these symptoms Hydrate well Eat regular healthy meals Stay active Use relaxation techniques(deep breathing, meditating, yoga) Do Not substitute Alcohol to help with tapering If you  have been on opioids for less than two weeks and do not have pain than it is ok to stop all together.  Plan to wean off of opioids This plan should start within one week post op of your joint replacement. Maintain the same interval or time between taking each dose and first decrease the dose.  Cut the total daily intake of opioids by one tablet each day Next start to increase the time between doses. The last dose that should be eliminated is the evening dose.          Follow-up Information     [name], MD Follow up in 1 week(s).   Specialty: Orthopedic Surgery Contact information:  [address] [place] [address] [phone]                  Signed: [name] 05/11/2021, 11:39 AM

## 2021-05-11 NOTE — Progress Notes (Signed)
Physical Therapy Treatment Patient Details Name: [name] MRN: [id number] DOB: 1950-12-10 Today's Date: 05/11/2021   History of Present Illness Pt is a 71 y/o male who presented with osteomyelitis and worsening ulcers of L foot. Pt underwent L BKA on 2/1. PMH CKD (on HD at home), CAD, DM, GERD, gout, HTN, PVD, prostate cancer.   PT Comments    Pt progressing with mobility; noted increased lethargy and slowed processing this session (pt attributes to pain meds). Today's session focused on bed mobility, seated activity and lateral scoots; pt requiring significant increased time and cues for problem solving with frequent redirection to task. Pt remains limited by generalized weakness, decreased activity tolerance, poor balance strategies/postural reactions and impaired cognition. Continue to recommend intensive CIR-level therapies to maximize functional mobility and independence prior to return home.    Recommendations for follow up therapy are one component of a multi-disciplinary discharge planning process, led by the attending physician.  Recommendations may be updated based on patient status, additional functional criteria and insurance authorization.  Follow Up Recommendations  Acute inpatient rehab (3hours/day)     Assistance Recommended at Discharge Frequent or constant Supervision/Assistance  Patient can return home with the following Two people to help with walking and/or transfers;A lot of help with bathing/dressing/bathroom;Assistance with cooking/housework;Assist for transportation;Help with stairs or ramp for entrance   Equipment Recommendations   (defer to next venue)    Recommendations for Other Services       Precautions / Restrictions Precautions Precautions: Fall;Other (comment) Precaution Comments: wound vac L LE Required Braces or Orthoses: Other Brace Other Brace: limb protector L LE, AFO to R LE Restrictions Weight Bearing Restrictions: Yes LLE Weight Bearing:  Non weight bearing     Mobility  Bed Mobility Overal bed mobility: Needs Assistance Bed Mobility: Supine to Sit, Sit to Supine, Rolling Rolling: Min assist   Supine to sit: Min guard, HOB elevated Sit to supine: Min guard   General bed mobility comments: Significant increased time and effort to complete sit<>supine and rolling transfers, requiring repeated, multimodal cues for sequencing and to attend to task; heavy reliance on bed rail use; difficulty sequencing need to lay flat in order to roll    Transfers Overall transfer level: Needs assistance Equipment used: None              Lateral/Scoot Transfers: Mod assist General transfer comment: pt performing lateral scoot at EOB requiring max verbal cues for sequencing, intermittent modA to assist with scooting hips, intermittent posterior/lateral LOB and asterixis(?)-like movements throughout; deferred OOB due to pt's lethargy/cognitive status    Ambulation/Gait                   Stairs             Wheelchair Mobility    Modified Rankin (Stroke Patients Only)       Balance Overall balance assessment: Needs assistance Sitting-balance support: Feet supported, Bilateral upper extremity supported Sitting balance-Leahy Scale: Poor Sitting balance - Comments: reliant on UE support                                    Cognition Arousal/Alertness: Suspect due to medications ("woozy") Behavior During Therapy: Flat affect Overall Cognitive Status: Impaired/Different from baseline Area of Impairment: Attention, Memory, Following commands, Safety/judgement, Awareness, Problem solving  Current Attention Level: Focused, Sustained Memory: Decreased short-term memory Following Commands: Follows one step commands with increased time, Follows one step commands inconsistently Safety/Judgement: Decreased awareness of safety, Decreased awareness of deficits Awareness:  Intellectual Problem Solving: Slow processing, Decreased initiation, Requires verbal cues, Difficulty sequencing, Requires tactile cues General Comments: pt attributes "I'm not myself" to pain medication, but later reports he feels cognition is normal. significant increased time to perform any task, unable to successfully dial phone for cafeteria without assist; pt forgetting tasks he had performed earlier in session. Pt reports, "I heard you... I'm just not paying attention." significant difficulty problem solving through basic tasks - MD notified        Exercises Amputee Exercises Straight Leg Raises: AAROM, Left, Supine    General Comments General comments (skin integrity, edema, etc.): MD/RN notified of cognitive status; no focal neuro deficits noted. LLE limb guard doffed for skin check, wound vac line repositioned for pressure relief; limb guard donned at end of session, pt unable to participate in education on donning/doffing. pt unaware of soiled bed, requiring maxA for pericare      Pertinent Vitals/Pain Pain Assessment Pain Assessment: Faces Faces Pain Scale: Hurts a little bit Pain Location: L LE Pain Descriptors / Indicators: Sore Pain Intervention(s): Monitored during session, Limited activity within patient's tolerance, Repositioned    Home Living                          Prior Function            PT Goals (current goals can now be found in the care plan section) Progress towards PT goals: Progressing toward goals    Frequency    Min 3X/week      PT Plan Current plan remains appropriate    Co-evaluation              AM-PAC PT "6 Clicks" Mobility   Outcome Measure  Help needed turning from your back to your side while in a flat bed without using bedrails?: A Lot Help needed moving from lying on your back to sitting on the side of a flat bed without using bedrails?: A Lot Help needed moving to and from a bed to a chair (including a  wheelchair)?: Total Help needed standing up from a chair using your arms (e.g., wheelchair or bedside chair)?: Total Help needed to walk in hospital room?: Total Help needed climbing 3-5 steps with a railing? : Total 6 Click Score: 8    End of Session   Activity Tolerance: Patient limited by fatigue;Patient limited by lethargy Patient left: in bed;with call bell/phone within reach;with bed alarm set Nurse Communication: Mobility status;Need for lift equipment PT Visit Diagnosis: Other abnormalities of gait and mobility (R26.89);Muscle weakness (generalized) (M62.81);Pain     Time: 4967-5916 PT Time Calculation (min) (ACUTE ONLY): 39 min  Charges:  $Therapeutic Activity: 23-37 mins $Neuromuscular Re-education: 8-22 mins                     Mabeline Caras, PT, DPT Acute Rehabilitation Services  Pager [phone] Office [place] 05/11/2021, 1:25 PM

## 2021-05-11 NOTE — Progress Notes (Signed)
Jon Ribas, MD  Physician Physical Medicine and Rehabilitation PMR Pre-admission    Signed Date of Service:  05/11/2021 11:23 AM  Related encounter: Admission (Discharged) from 05/09/2021 in [place]      Show:Clear all '[x]' Written'[x]' Templated'[x]' Copied  Added by: '[x]' Cristina Gong, RN'[x]' Raulkar, Clide Deutscher, MD  '[]' Hover for details                                                                                                                                                                                                                                                                                                                                        PMR Admission Coordinator Pre-Admission Assessment   Patient: [name] is an 71 y.o., male MRN: [id number] DOB: 09-Jun-1950 Height: '5\' 11"'  (180.3 cm) Weight: 97.5 kg   Insurance Information HMO:     PPO:      PCP:      IPA:      80/20:      OTHER:  PRIMARY: Medicare a and b      Policy#: [id number]      Subscriber: pt Benefits:  Phone #: passport one source online     Name: 2/2 Eff. Date: a 08/07/2014 and b 09/07/2018     Deduct: $1600      Out of Pocket Max: none      Life Max: none CIR: 100%      SNF: 20 full days Outpatient: 80%     Co-Pay: 20% Home Health: 100%      Co-Pay: none DME: 80%     Co-Pay: 20% Providers: pt choice  SECONDARY: BCBS supplement      Policy#: [id number]   Financial Counselor:       Phone#:    The Actuary for patients in Inpatient Rehabilitation Facilities with attached Privacy Act [place] Records  was provided and verbally reviewed with: Patient and Family   Emergency Contact Information Contact Information       Name Relation Home Work Union Spouse     [phone]          Current Medical History  Patient Admitting Diagnosis: BKA   History of Present Illness:  71 year old right-handed male with history of end-stage renal disease with hemodialysis, diabetes mellitus, gout, prostate cancer with TURP 06/28/2020, hypertension atrial flutter with ablation 04/06/2020 per Dr. [name], CABG 2008, quit smoking 32 years ago.   Presented to 04/27/2021 with pain ulceration deformity left foot status post left fourth and fifth ray amputation 02/02/2021 per Dr. [name].  Limb was not felt to be salvageable underwent left BKA 05/09/2021 per Dr. [name].  Wound VAC applied.  Hospital course follow-up renal service for ongoing hemodialysis.  Acute on chronic anemia latest hemoglobin 9.7.     Patient's medical record from [facility] has been reviewed by the rehabilitation admission coordinator and physician.   Past Medical History      Past Medical History:  Diagnosis Date   Ambulates with cane      straight cane   Cervical myelopathy ([place]) 02/06/2018   Chronic kidney disease      dailysis M W F- home   Complication of anesthesia     Coronary artery disease     Diabetes mellitus without complication ([place])      type2   Diabetic foot ulcer ([place])     Diabetic neuropathy ([place]) 02/06/2018   Gait abnormality 08/24/2018   GERD (gastroesophageal reflux disease)       01/06/20- not current   Gout     History of blood transfusion     History of kidney stones      passed stones   Hypercholesteremia     Hypertension     Hypothyroidism     Neuromuscular disorder (HCC)      neuropathy left leg and bilateral feet   Neuropathy     PONV (postoperative nausea and vomiting)     Prostate cancer ([place])     PVD (peripheral vascular disease) ([place])      with amputations    Has the patient had major surgery during 100 days prior to admission? Yes   Family History   family history includes CAD in his father; Cancer in his father; Diabetes Mellitus II in his brother, brother, father, and  mother; Kidney disease in his brother, brother, father, and mother; Stomach cancer (age of onset: 85) in his brother.   Current Medications   Current Facility-Administered Medications:    0.9 %  sodium chloride infusion, , Intravenous, Continuous, [name], MD, Last Rate: 10 mL/hr at 05/09/21 1054, New Bag at 05/09/21 1054   acetaminophen (TYLENOL) tablet 650 mg, 650 mg, Oral, Q6H PRN, [name], MD, 650 mg at 05/10/21 1857   allopurinol (ZYLOPRIM) tablet 200 mg, 200 mg, Oral, Daily, [name], MD, 200 mg at 05/11/21 0825   alum & mag hydroxide-simeth (MAALOX/MYLANTA) 200-200-20 MG/5ML suspension 15-30 mL, 15-30 mL, Oral, Q2H PRN, [name], MD   ascorbic acid (VITAMIN C) tablet 1,000 mg, 1,000 mg, Oral, Daily, [name], MD, 1,000 mg at 05/11/21 7414   bisacodyl (DULCOLAX) EC tablet 5 mg, 5 mg, Oral, Daily PRN, [name], MD   calcitRIOL (ROCALTROL) capsule 0.5 mcg, 0.5 mcg, Oral, Daily, [name], MD, 0.5 mcg at 05/11/21 [phone]  carvedilol (COREG) tablet 12.5 mg, 12.5 mg, Oral, BID WC, [name], MD, 12.5 mg at 05/11/21 0827   Chlorhexidine Gluconate Cloth 2 % PADS 6 each, 6 each, Topical, Q0600, Collins, Samantha G, PA-C   docusate sodium (COLACE) capsule 100 mg, 100 mg, Oral, Daily, [name], MD, 100 mg at 05/11/21 0824   guaiFENesin-dextromethorphan (ROBITUSSIN DM) 100-10 MG/5ML syrup 15 mL, 15 mL, Oral, Q4H PRN, [name], MD   hydrALAZINE (APRESOLINE) injection 5 mg, 5 mg, Intravenous, Q20 Min PRN, [name], MD   HYDROmorphone (DILAUDID) injection 1 mg, 1 mg, Intravenous, Q3H PRN, [name], MD   insulin aspart (novoLOG) injection 0-6 Units, 0-6 Units, Subcutaneous, TID WC, [name], MD, 1 Units at 05/10/21 1237   insulin aspart (novoLOG) injection 2 Units, 2 Units, Subcutaneous, TID WC, [name], MD, 2 Units at 05/11/21 [phone]   labetalol (NORMODYNE) injection 10 mg, 10 mg, Intravenous, Q10 min PRN, [name],  MD   levothyroxine (SYNTHROID) tablet 112 mcg, 112 mcg, Oral, QAC breakfast, [name], MD, 112 mcg at 05/11/21 0532   losartan (COZAAR) tablet 100 mg, 100 mg, Oral, Daily, [name], MD, 100 mg at 05/11/21 5573   magnesium sulfate IVPB 2 g 50 mL, 2 g, Intravenous, Daily PRN, [name], MD   metoprolol tartrate (LOPRESSOR) injection 2-5 mg, 2-5 mg, Intravenous, Q2H PRN, [name], MD   nutrition supplement (JUVEN) (JUVEN) powder packet 1 packet, 1 packet, Oral, BID BM, [name], MD, 1 packet at 05/10/21 1237   ondansetron  [facility]) injection 4 mg, 4 mg, Intravenous, Q6H PRN, [name], MD   oxyCODONE (Oxy IR/ROXICODONE) immediate release tablet 10 mg, 10 mg, Oral, Q4H PRN, [name], MD, 10 mg at 05/11/21 0826   pantoprazole (PROTONIX) EC tablet 40 mg, 40 mg, Oral, Daily, [name], MD, 40 mg at 05/11/21 0825   phenol (CHLORASEPTIC) mouth spray 1 spray, 1 spray, Mouth/Throat, PRN, [name], MD   polyethylene glycol (MIRALAX / GLYCOLAX) packet 17 g, 17 g, Oral, Daily PRN, [name], MD   polyvinyl alcohol (LIQUIFILM TEARS) 1.4 % ophthalmic solution 1-2 drop, 1-2 drop, Both Eyes, Daily, Pham, Minh Q, RPH-CPP, 1 drop at 05/11/21 0834   potassium chloride SA (KLOR-CON M) CR tablet 20-40 mEq, 20-40 mEq, Oral, Daily PRN, [name], MD   pregabalin (LYRICA) capsule 150 mg, 150 mg, Oral, Daily, Meridee Score V, MD, 150 mg at 05/11/21 0824   pregabalin (LYRICA) capsule 300 mg, 300 mg, Oral, QHS, Pham, Minh Q, RPH-CPP, 300 mg at 05/10/21 2121   prochlorperazine (COMPAZINE) tablet 5 mg, 5 mg, Oral, BID, [name], MD, 5 mg at 05/11/21 2202   rosuvastatin (CRESTOR) tablet 10 mg, 10 mg, Oral, Daily, [name], MD, 10 mg at 05/11/21 5427   zinc sulfate capsule 220 mg, 220 mg, Oral, Daily, [name], MD, 220 mg at 05/11/21 0623   Patients Current Diet:  Diet Order                  Diet regular Room service appropriate? Yes; Fluid  consistency: Thin  Diet effective now                         Precautions / Restrictions Precautions Precautions: Fall, Other (comment) Precaution Comments: wound vac L LE Other Brace: limb protector L LE, AFO to R  LE Restrictions Weight Bearing Restrictions: Yes LLE Weight Bearing: Non weight bearing    Has the patient had 2 or more falls or a fall with injury in the past year? No   Prior Activity Level Community (5-7x/wk): Mod I with RW; wife drives and assists him with his home hemodialysis   Prior Functional Level Self Care: Did the patient need help bathing, dressing, using the toilet or eating? Independent   Indoor Mobility: Did the patient need assistance with walking from room to room (with or without device)? Independent   Stairs: Did the patient need assistance with internal or external stairs (with or without device)? Independent   Functional Cognition: Did the patient need help planning regular tasks such as shopping or remembering to take medications? Independent   Patient Information Are you of Hispanic, Latino/a,or Spanish origin?: A. No, not of Hispanic, Latino/a, or Spanish origin What is your race?: A. White Do you need or want an interpreter to communicate with a doctor or health care staff?: 0. No   Patient's Response To:  Health Literacy and Transportation Is the patient able to respond to health literacy and transportation needs?: Yes Health Literacy - How often do you need to have someone help you when you read instructions, pamphlets, or other written material from your doctor or pharmacy?: Never In the past 12 months, has lack of transportation kept you from medical appointments or from getting medications?: No In the past 12 months, has lack of transportation kept you from meetings, work, or from getting things needed for daily living?: No   Development worker, international aid / [place] Devices/Equipment: Wheelchair, Environmental consultant (specify type),  Shower chair with back, CBG Meter, Eyeglasses, Contact lenses Home Equipment: Rollator (4 wheels), Shower seat, Cane - single point, [employer] (2 wheels), Wheelchair - manual   Prior Device Use: Indicate devices/aids used by the patient prior to current illness, exacerbation or injury? Walker   Current Functional Level Cognition   Overall Cognitive Status: Impaired/Different from baseline Current Attention Level: Sustained Orientation Level: Oriented X4 Following Commands: Follows one step commands with increased time Safety/Judgement: Decreased awareness of safety, Decreased awareness of deficits General Comments: Pleasant, follows directions relatively consistently. Flat affect and slower processing with need for sequencing, safety and attention cues. Questionable memory deficits as pt with difficulty recalling type of dialysis he is on at home, also unable to recall all of dog's names. does endorse feeling a little foggy after procedure yesterday    Extremity Assessment (includes Sensation/Coordination)   Upper Extremity Assessment: RUE deficits/detail, LUE deficits/detail RUE Deficits / Details: Asterixis-type movements noted; some difficulty overshooting/undershooting targets and difficulty maintaining grasp at times RUE Coordination: decreased fine motor LUE Deficits / Details: Asterixis-type movements noted; some difficulty overshooting/undershooting targets and difficulty maintaining grasp at times LUE Coordination: decreased fine motor  Lower Extremity Assessment: RLE deficits/detail, LLE deficits/detail RLE Deficits / Details: h/o R ankle PF/tibial IR(?) contracture for which he wears brace at baseline; asterixis-like (?) instability noted LLE Deficits / Details: s/p L BKA     ADLs   Overall ADL's : Needs assistance/impaired Eating/Feeding: Set up, Sitting Eating/Feeding Details (indicate cue type and reason): noted with spillage on self/linens on entry (likely from  asterixis-type movements) Grooming: Min guard, Sitting Upper Body Bathing: Minimal assistance, Sitting, Bed level Upper Body Bathing Details (indicate cue type and reason): able to assist in bathing UB after spillage from breakfast Lower Body Bathing: Maximal assistance, Sitting/lateral leans, Bed level Upper Body Dressing : Minimal assistance,  Bed level, Sitting Upper Body Dressing Details (indicate cue type and reason): to don new gown, dificulty with coordination Lower Body Dressing: Maximal assistance, Sitting/lateral leans Lower Body Dressing Details (indicate cue type and reason): Max A to don shoe/AFO to [place] and Hygiene: Total assistance, Bed level General ADL Comments: Pt limited by asterixis movements of UE/LEs increasing fall risk sitting/standing attempts. expected post op deficits in balance noted requiring increased assist with LB ADLs     Mobility   Overal bed mobility: Needs Assistance Bed Mobility: Supine to Sit, Sit to Supine Supine to sit: Min guard, HOB elevated Sit to supine: Min guard General bed mobility comments: heavy use of bed rail, increased time and effort; close guarding at EOB     Transfers   Overall transfer level: Needs assistance Equipment used: Rolling walker (2 wheels) Transfers: Sit to/from Stand Sit to Stand: Mod assist, +2 physical assistance, +2 safety/equipment, From elevated surface General transfer comment: performed 2x sit<>stand from elevated bed height to RW, heavy modA for trunk elevation and stability, requiring repeated cues for hand placement, difficulty problem solving; pt unable to perform complete hop on RLE; pt with BUE and RLE asterixis(?) resulting in uncontrolled sitting EOB requiring maxA to prevent RW sliding forward and fall     Ambulation / Gait / Stairs / Proofreader / Balance Dynamic Sitting Balance Sitting balance - Comments: initial posterior LOB, progressing to  ability to go from midline to propping on L elbow with min guard for balance; required assist to don R foot sock and brakce Balance Overall balance assessment: Needs assistance Sitting-balance support: Feet supported, Bilateral upper extremity supported Sitting balance-Leahy Scale: Fair Sitting balance - Comments: initial posterior LOB, progressing to ability to go from midline to propping on L elbow with min guard for balance; required assist to don R foot sock and brakce Standing balance support: Bilateral upper extremity supported, During functional activity, Reliant on assistive device for balance Standing balance-Leahy Scale: Poor Standing balance comment: BUE support on RW in standing + external support     Special needs/care consideration Home Hemodialysis 4 days per week which his wife is his trained partner T[facility] Sat hemodialysis on acute    Previous Home Environment  Living Arrangements: Spouse/significant other, Children  Lives With: Spouse Available Help at Discharge: Family, Available 24 hours/day Type of Home: House Home Layout: Multi-level, Laundry or work area in basement Home Access: Stairs to enter, Ramped entrance Entrance Stairs-Rails: None Technical brewer of Steps: 1 Bathroom Shower/Tub: Multimedia programmer: Programmer, systems: Yes Home Care Services: No Additional Comments: wife assists with set up of home HD   Discharge Living Setting Plans for Discharge Living Setting: Patient's home, Lives with (comment) (wife) Type of Home at Discharge: House Discharge Home Layout: Multi-level, Laundry or work area in basement Discharge Home Access: Stairs to enter, Ramped entrance Entrance Stairs-Rails: None Technical brewer of Steps: 1 Discharge Bathroom Shower/Tub: Horticulturist, commercial: Standard Discharge Bathroom Accessibility: Yes How Accessible: Accessible via walker Does the patient have any problems  obtaining your medications?: No   Social/Family/Support Systems Patient Roles: Spouse, Parent Contact Information: wife, Remo Lipps Anticipated Caregiver: wife and family Anticipated Caregiver's Contact Information: see contacts Ability/Limitations of Caregiver: no limitations Caregiver Availability: 24/7 Discharge Plan Discussed with Primary Caregiver: Yes Is Caregiver In Agreement with Plan?: Yes Does Caregiver/Family have Issues with Lodging/Transportation while Pt is in Rehab?: No  Goals Patient/Family Goal for Rehab: supervision to min assist with PT and OT at wheelchair level Expected length of stay: ELOS 10 to 14 days Additional Information: Does home Hemodialysis 4 days per week Pt/Family Agrees to Admission and willing to participate: Yes Program Orientation Provided & Reviewed with Pt/Caregiver Including Roles  & Responsibilities: Yes   Decrease burden of Care through IP rehab admission: n/a   Possible need for SNF placement upon discharge: not anticipated   Patient Condition: I have reviewed medical records from [facility] , spoken with CM, and patient and spouse. I met with patient at the bedside for inpatient rehabilitation assessment.  Patient will benefit from ongoing PT and OT, can actively participate in 3 hours of therapy a day 5 days of the week, and can make measurable gains during the admission.  Patient will also benefit from the coordinated team approach during an Inpatient Acute Rehabilitation admission.  The patient will receive intensive therapy as well as Rehabilitation physician, nursing, social worker, and care management interventions.  Due to bladder management, bowel management, safety, skin/wound care, disease management, medication administration, pain management, and patient education the patient requires 24 hour a day rehabilitation nursing.  The patient is currently mod assist overall with mobility and basic ADLs.  Discharge setting and therapy post  discharge at home with home health is anticipated.  Patient has agreed to participate in the Acute Inpatient Rehabilitation Program and will admit today.   Preadmission Screen Completed By:  Cleatrice Burke, 05/11/2021 11:23 AM ______________________________________________________________________   Discussed status with Dr. Ranell Patrick on 05/11/2021 at 1127 and received approval for admission today.   Admission Coordinator:  Cleatrice Burke, RN, time  1540 Date  05/11/2021    Assessment/Plan: Diagnosis: s/p L BKA Does the need for close, 24 hr/day Medical supervision in concert with the patient's rehab needs make it unreasonable for this patient to be served in a less intensive setting? Yes Co-Morbidities requiring supervision/potential complications: overweight, hypercholesteremia, neuropathy, gout, hypertension Due to bladder management, bowel management, safety, skin/wound care, disease management, medication administration, pain management, and patient education, does the patient require 24 hr/day rehab nursing? Yes Does the patient require coordinated care of a physician, rehab nurse, PT, OT to address physical and functional deficits in the context of the above medical diagnosis(es)? Yes Addressing deficits in the following areas: balance, endurance, locomotion, strength, transferring, bowel/bladder control, bathing, dressing, feeding, grooming, toileting, cognition, and psychosocial support Can the patient actively participate in an intensive therapy program of at least 3 hrs of therapy 5 days a week? Yes The potential for patient to make measurable gains while on inpatient rehab is excellent Anticipated functional outcomes upon discharge from inpatient rehab: min assist PT, supervision OT, independent SLP Estimated rehab length of stay to reach the above functional goals is: 8-12 days Anticipated discharge destination: Home 10. Overall Rehab/Functional Prognosis: excellent      MD Signature: Leeroy Cha, MD         Revision History                          Note Details  Author Jon Ribas, MD File Time 05/11/2021 11:33 AM  Author Type Physician Status Signed  Last Editor Jon Ribas, MD Service Physical Medicine and [place] # [id number] Admit Date 05/11/2021

## 2021-05-11 NOTE — PMR Pre-admission (Signed)
PMR Admission Coordinator Pre-Admission Assessment  Patient: [name] is an 71 y.o., male MRN: [id number] DOB: 1950-11-18 Height: _0  (180.3 cm) Weight: 97.5 kg  Insurance Information HMO:     PPO:      PCP:      IPA:      80/20:      OTHER:  PRIMARY: Medicare a and b      Policy#: [id number]      Subscriber: pt Benefits:  Phone #: passport one source online     Name: 2/2 Eff. Date: a 08/07/2014 and b 09/07/2018     Deduct: $1600      Out of Pocket Max: none      Life Max: none CIR: 100%      SNF: 20 full days Outpatient: 80%     Co-Pay: 20% Home Health: 100%      Co-Pay: none DME: 80%     Co-Pay: 20% Providers: pt choice  SECONDARY: BCBS supplement      Policy#: [id number]  Financial Counselor:       Phone#:   The Actuary for patients in Inpatient Rehabilitation Facilities with attached Privacy Act [place] Records was provided and verbally reviewed with: Patient and Family  Emergency Contact Information Contact Information     Name Relation Home Work Harlan Spouse   [phone]      Current Medical History  Patient Admitting Diagnosis: BKA  History of Present Illness:  71 year old right-handed male with history of end-stage renal disease with hemodialysis, diabetes mellitus, gout, prostate cancer with TURP 06/28/2020, hypertension atrial flutter with ablation 04/06/2020 per Dr. [name], CABG 2008, quit smoking 32 years ago.   Presented to 04/27/2021 with pain ulceration deformity left foot status post left fourth and fifth ray amputation 02/02/2021 per Dr. [name].  Limb was not felt to be salvageable underwent left BKA 05/09/2021 per Dr. [name].  Wound VAC applied.  Hospital course follow-up renal service for ongoing hemodialysis.  Acute on chronic anemia latest hemoglobin 9.7.    Patient's medical record [facility] has been reviewed by the rehabilitation admission coordinator and physician.  Past Medical  History  Past Medical History:  Diagnosis Date   Ambulates with cane    straight cane   Cervical myelopathy ([place]) 02/06/2018   Chronic kidney disease    dailysis M W F- home   Complication of anesthesia    Coronary artery disease    Diabetes mellitus without complication ([place])    type2   Diabetic foot ulcer ([place])    Diabetic neuropathy ([place]) 02/06/2018   Gait abnormality 08/24/2018   GERD (gastroesophageal reflux disease)     01/06/20- not current   Gout    History of blood transfusion    History of kidney stones    passed stones   Hypercholesteremia    Hypertension    Hypothyroidism    Neuromuscular disorder (HCC)    neuropathy left leg and bilateral feet   Neuropathy    PONV (postoperative nausea and vomiting)    Prostate cancer ([place])    PVD (peripheral vascular disease) ([place])    with amputations   Has the patient had major surgery during 100 days prior to admission? Yes  Family History   family history includes CAD in his father; Cancer in his father; Diabetes Mellitus II in his brother, brother, father, and mother; Kidney disease in his brother, brother, father, and mother; Stomach cancer (age of onset: 42) in  his brother.  Current Medications  Current Facility-Administered Medications:    0.9 %  sodium chloride infusion, , Intravenous, Continuous, [name], MD, Last Rate: 10 mL/hr at 05/09/21 1054, New Bag at 05/09/21 1054   acetaminophen (TYLENOL) tablet 650 mg, 650 mg, Oral, Q6H PRN, [name], MD, 650 mg at 05/10/21 1857   allopurinol (ZYLOPRIM) tablet 200 mg, 200 mg, Oral, Daily, [name], MD, 200 mg at 05/11/21 0825   alum & mag hydroxide-simeth (MAALOX/MYLANTA) 200-200-20 MG/5ML suspension 15-30 mL, 15-30 mL, Oral, Q2H PRN, [name], MD   ascorbic acid (VITAMIN C) tablet 1,000 mg, 1,000 mg, Oral, Daily, [name], MD, 1,000 mg at 05/11/21 1287   bisacodyl (DULCOLAX) EC tablet 5 mg, 5 mg, Oral, Daily PRN, [name], MD    calcitRIOL (ROCALTROL) capsule 0.5 mcg, 0.5 mcg, Oral, Daily, [name], MD, 0.5 mcg at 05/11/21 8676   carvedilol (COREG) tablet 12.5 mg, 12.5 mg, Oral, BID WC, [name], MD, 12.5 mg at 05/11/21 0827   Chlorhexidine Gluconate Cloth 2 % PADS 6 each, 6 each, Topical, Q0600, Collins, Samantha G, PA-C   docusate sodium (COLACE) capsule 100 mg, 100 mg, Oral, Daily, [name], MD, 100 mg at 05/11/21 0824   guaiFENesin-dextromethorphan (ROBITUSSIN DM) 100-10 MG/5ML syrup 15 mL, 15 mL, Oral, Q4H PRN, [name], MD   hydrALAZINE (APRESOLINE) injection 5 mg, 5 mg, Intravenous, Q20 Min PRN, [name], MD   HYDROmorphone (DILAUDID) injection 1 mg, 1 mg, Intravenous, Q3H PRN, [name], MD   insulin aspart (novoLOG) injection 0-6 Units, 0-6 Units, Subcutaneous, TID WC, [name], MD, 1 Units at 05/10/21 1237   insulin aspart (novoLOG) injection 2 Units, 2 Units, Subcutaneous, TID WC, [name], MD, 2 Units at 05/11/21 [phone]   labetalol (NORMODYNE) injection 10 mg, 10 mg, Intravenous, Q10 min PRN, [name], MD   levothyroxine (SYNTHROID) tablet 112 mcg, 112 mcg, Oral, QAC breakfast, [name], MD, 112 mcg at 05/11/21 0532   losartan (COZAAR) tablet 100 mg, 100 mg, Oral, Daily, [name], MD, 100 mg at 05/11/21 4709   magnesium sulfate IVPB 2 g 50 mL, 2 g, Intravenous, Daily PRN, [name], MD   metoprolol tartrate (LOPRESSOR) injection 2-5 mg, 2-5 mg, Intravenous, Q2H PRN, [name], MD   nutrition supplement (JUVEN) (JUVEN) powder packet 1 packet, 1 packet, Oral, BID BM, [name], MD, 1 packet at 05/10/21 1237   ondansetron [facility]) injection 4 mg, 4 mg, Intravenous, Q6H PRN, [name], MD   oxyCODONE (Oxy IR/ROXICODONE) immediate release tablet 10 mg, 10 mg, Oral, Q4H PRN, [name], MD, 10 mg at 05/11/21 0826   pantoprazole (PROTONIX) EC tablet 40 mg, 40 mg, Oral, Daily, [name], MD, 40 mg at 05/11/21 0825   phenol  (CHLORASEPTIC) mouth spray 1 spray, 1 spray, Mouth/Throat, PRN, [name], MD   polyethylene glycol (MIRALAX / GLYCOLAX) packet 17 g, 17 g, Oral, Daily PRN, [name], MD   polyvinyl alcohol (LIQUIFILM TEARS) 1.4 % ophthalmic solution 1-2 drop, 1-2 drop, Both Eyes, Daily, Pham, Minh Q, RPH-CPP, 1 drop at 05/11/21 0834   potassium chloride SA (KLOR-CON M) CR tablet 20-40 mEq, 20-40 mEq, Oral, Daily PRN, [name], MD   pregabalin (LYRICA) capsule 150 mg, 150 mg, Oral, Daily, [name], MD, 150 mg at 05/11/21 0824   pregabalin (LYRICA) capsule  300 mg, 300 mg, Oral, QHS, Pham, Minh Q, RPH-CPP, 300 mg at 05/10/21 2121   prochlorperazine (COMPAZINE) tablet 5 mg, 5 mg, Oral, BID, [name], MD, 5 mg at 05/11/21 0347   rosuvastatin (CRESTOR) tablet 10 mg, 10 mg, Oral, Daily, [name], MD, 10 mg at 05/11/21 4259   zinc sulfate capsule 220 mg, 220 mg, Oral, Daily, [name], MD, 220 mg at 05/11/21 5638  Patients Current Diet:  Diet Order             Diet regular Room service appropriate? Yes; Fluid consistency: Thin  Diet effective now                   Precautions / Restrictions Precautions Precautions: Fall, Other (comment) Precaution Comments: wound vac L LE Other Brace: limb protector L LE, AFO to R LE Restrictions Weight Bearing Restrictions: Yes LLE Weight Bearing: Non weight bearing   Has the patient had 2 or more falls or a fall with injury in the past year? No  Prior Activity Level Community (5-7x/wk): Mod I with RW; wife drives and assists him with his home hemodialysis  Prior Functional Level Self Care: Did the patient need help bathing, dressing, using the toilet or eating? Independent  Indoor Mobility: Did the patient need assistance with walking from room to room (with or without device)? Independent  Stairs: Did the patient need assistance with internal or external stairs (with or without device)? Independent  Functional Cognition:  Did the patient need help planning regular tasks such as shopping or remembering to take medications? Independent  Patient Information Are you of Hispanic, Latino/a,or Spanish origin?: A. No, not of Hispanic, Latino/a, or Spanish origin What is your race?: A. White Do you need or want an interpreter to communicate with a doctor or health care staff?: 0. No  Patient's Response To:  Health Literacy and Transportation Is the patient able to respond to health literacy and transportation needs?: Yes Health Literacy - How often do you need to have someone help you when you read instructions, pamphlets, or other written material from your doctor or pharmacy?: Never In the past 12 months, has lack of transportation kept you from medical appointments or from getting medications?: No In the past 12 months, has lack of transportation kept you from meetings, work, or from getting things needed for daily living?: No  Development worker, international aid / [place] Devices/Equipment: Wheelchair, Environmental consultant (specify type), Shower chair with back, CBG Meter, Eyeglasses, Contact lenses Home Equipment: Rollator (4 wheels), Shower seat, Cane - single point, [employer] (2 wheels), Wheelchair - manual  Prior Device Use: Indicate devices/aids used by the patient prior to current illness, exacerbation or injury? Walker  Current Functional Level Cognition  Overall Cognitive Status: Impaired/Different from baseline Current Attention Level: Sustained Orientation Level: Oriented X4 Following Commands: Follows one step commands with increased time Safety/Judgement: Decreased awareness of safety, Decreased awareness of deficits General Comments: Pleasant, follows directions relatively consistently. Flat affect and slower processing with need for sequencing, safety and attention cues. Questionable memory deficits as pt with difficulty recalling type of dialysis he is on at home, also unable to recall all of dog's  names. does endorse feeling a little foggy after procedure yesterday    Extremity Assessment (includes Sensation/Coordination)  Upper Extremity Assessment: RUE deficits/detail, LUE deficits/detail RUE Deficits / Details: Asterixis-type movements noted; some difficulty overshooting/undershooting targets and difficulty maintaining grasp at times RUE Coordination: decreased fine motor LUE Deficits /  Details: Asterixis-type movements noted; some difficulty overshooting/undershooting targets and difficulty maintaining grasp at times LUE Coordination: decreased fine motor  Lower Extremity Assessment: RLE deficits/detail, LLE deficits/detail RLE Deficits / Details: h/o R ankle PF/tibial IR(?) contracture for which he wears brace at baseline; asterixis-like (?) instability noted LLE Deficits / Details: s/p L BKA    ADLs  Overall ADL's : Needs assistance/impaired Eating/Feeding: Set up, Sitting Eating/Feeding Details (indicate cue type and reason): noted with spillage on self/linens on entry (likely from asterixis-type movements) Grooming: Min guard, Sitting Upper Body Bathing: Minimal assistance, Sitting, Bed level Upper Body Bathing Details (indicate cue type and reason): able to assist in bathing UB after spillage from breakfast Lower Body Bathing: Maximal assistance, Sitting/lateral leans, Bed level Upper Body Dressing : Minimal assistance, Bed level, Sitting Upper Body Dressing Details (indicate cue type and reason): to don new gown, dificulty with coordination Lower Body Dressing: Maximal assistance, Sitting/lateral leans Lower Body Dressing Details (indicate cue type and reason): Max A to don shoe/AFO to [place] and Hygiene: Total assistance, Bed level General ADL Comments: Pt limited by asterixis movements of UE/LEs increasing fall risk sitting/standing attempts. expected post op deficits in balance noted requiring increased assist with LB ADLs    Mobility   Overal bed mobility: Needs Assistance Bed Mobility: Supine to Sit, Sit to Supine Supine to sit: Min guard, HOB elevated Sit to supine: Min guard General bed mobility comments: heavy use of bed rail, increased time and effort; close guarding at EOB    Transfers  Overall transfer level: Needs assistance Equipment used: Rolling walker (2 wheels) Transfers: Sit to/from Stand Sit to Stand: Mod assist, +2 physical assistance, +2 safety/equipment, From elevated surface General transfer comment: performed 2x sit<>stand from elevated bed height to RW, heavy modA for trunk elevation and stability, requiring repeated cues for hand placement, difficulty problem solving; pt unable to perform complete hop on RLE; pt with BUE and RLE asterixis(?) resulting in uncontrolled sitting EOB requiring maxA to prevent RW sliding forward and fall    Ambulation / Gait / Stairs / Office manager / Balance Dynamic Sitting Balance Sitting balance - Comments: initial posterior LOB, progressing to ability to go from midline to propping on L elbow with min guard for balance; required assist to don R foot sock and brakce Balance Overall balance assessment: Needs assistance Sitting-balance support: Feet supported, Bilateral upper extremity supported Sitting balance-Leahy Scale: Fair Sitting balance - Comments: initial posterior LOB, progressing to ability to go from midline to propping on L elbow with min guard for balance; required assist to don R foot sock and brakce Standing balance support: Bilateral upper extremity supported, During functional activity, Reliant on assistive device for balance Standing balance-Leahy Scale: Poor Standing balance comment: BUE support on RW in standing + external support    Special needs/care consideration Home Hemodialysis 4 days per week which his wife is his trained partner [facility] Sat hemodialysis on acute   Previous Home Environment  Living Arrangements:  Spouse/significant other, Children  Lives With: Spouse Available Help at Discharge: Family, Available 24 hours/day Type of Home: House Home Layout: Multi-level, Laundry or work area in basement Home Access: Stairs to enter, Ramped entrance Entrance Stairs-Rails: None Technical brewer of Steps: 1 Bathroom Shower/Tub: Multimedia programmer: Programmer, systems: Yes Home Care Services: No Additional Comments: wife assists with set up of home HD  Discharge Living Setting Plans for Discharge Living Setting: Patient's home,  Lives with (comment) (wife) Type of Home at Discharge: House Discharge Home Layout: Multi-level, Laundry or work area in basement Discharge Home Access: Stairs to enter, Ramped entrance Entrance Stairs-Rails: None Technical brewer of Steps: 1 Discharge Bathroom Shower/Tub: Horticulturist, commercial: Standard Discharge Bathroom Accessibility: Yes How Accessible: Accessible via walker Does the patient have any problems obtaining your medications?: No  Social/Family/Support Systems Patient Roles: Spouse, Parent Contact Information: wife, Remo Lipps Anticipated Caregiver: wife and family Anticipated Caregiver's Contact Information: see contacts Ability/Limitations of Caregiver: no limitations Caregiver Availability: 24/7 Discharge Plan Discussed with Primary Caregiver: Yes Is Caregiver In Agreement with Plan?: Yes Does Caregiver/Family have Issues with Lodging/Transportation while Pt is in Rehab?: No  Goals Patient/Family Goal for Rehab: supervision to min assist with PT and OT at wheelchair level Expected length of stay: ELOS 10 to 14 days Additional Information: Does home Hemodialysis 4 days per week Pt/Family Agrees to Admission and willing to participate: Yes Program Orientation Provided & Reviewed with Pt/Caregiver Including Roles  & Responsibilities: Yes  Decrease burden of Care through IP rehab admission:  n/a  Possible need for SNF placement upon discharge: not anticipated  Patient Condition: I have reviewed medical records from [facility] , spoken with CM, and patient and spouse. I met with patient at the bedside for inpatient rehabilitation assessment.  Patient will benefit from ongoing PT and OT, can actively participate in 3 hours of therapy a day 5 days of the week, and can make measurable gains during the admission.  Patient will also benefit from the coordinated team approach during an Inpatient Acute Rehabilitation admission.  The patient will receive intensive therapy as well as Rehabilitation physician, nursing, social worker, and care management interventions.  Due to bladder management, bowel management, safety, skin/wound care, disease management, medication administration, pain management, and patient education the patient requires 24 hour a day rehabilitation nursing.  The patient is currently mod assist overall with mobility and basic ADLs.  Discharge setting and therapy post discharge at home with home health is anticipated.  Patient has agreed to participate in the Acute Inpatient Rehabilitation Program and will admit today.  Preadmission Screen Completed By:  Cleatrice Burke, 05/11/2021 11:23 AM ______________________________________________________________________   Discussed status with Dr. Ranell Patrick on 05/11/2021 at 1127 and received approval for admission today.  Admission Coordinator:  Cleatrice Burke, RN, time  [id number] Date  05/11/2021   Assessment/Plan: Diagnosis: s/p L BKA Does the need for close, 24 hr/day Medical supervision in concert with the patient's rehab needs make it unreasonable for this patient to be served in a less intensive setting? Yes Co-Morbidities requiring supervision/potential complications: overweight, hypercholesteremia, neuropathy, gout, hypertension Due to bladder management, bowel management, safety, skin/wound care, disease management,  medication administration, pain management, and patient education, does the patient require 24 hr/day rehab nursing? Yes Does the patient require coordinated care of a physician, rehab nurse, PT, OT to address physical and functional deficits in the context of the above medical diagnosis(es)? Yes Addressing deficits in the following areas: balance, endurance, locomotion, strength, transferring, bowel/bladder control, bathing, dressing, feeding, grooming, toileting, cognition, and psychosocial support Can the patient actively participate in an intensive therapy program of at least 3 hrs of therapy 5 days a week? Yes The potential for patient to make measurable gains while on inpatient rehab is excellent Anticipated functional outcomes upon discharge from inpatient rehab: min assist PT, supervision OT, independent SLP Estimated rehab length of stay to reach the above functional goals is: 8-12  days Anticipated discharge destination: Home 10. Overall Rehab/Functional Prognosis: excellent   MD Signature: Leeroy Cha, MD

## 2021-05-11 NOTE — Progress Notes (Incomplete)
Inpatient Rehabilitation Admission Medication Review by a Pharmacist  A complete drug regimen review was completed for this patient to identify any potential clinically significant medication issues.  High Risk Drug Classes Is patient taking? Indication by Medication  Antipsychotic No   Anticoagulant No   Antibiotic No   Opioid Yes OxyIR- acute pain  Antiplatelet No   Hypoglycemics/insulin Yes iSS, scheduled insulin- T2DM  Vasoactive Medication Yes Coreg, cozaar- hypertension  Chemotherapy No   Other Yes Protonix- GERD K-dur- potassium supplementation Allopurinol- gout Calcitriol- secondary hyperparathyroidism Synthroid- hypothyroidism Crestor- HLD Pregabalin- neuropathic pain     Type of Medication Issue Identified Description of Issue Recommendation(s)  Drug Interaction(s) (clinically significant)     Duplicate Therapy     Allergy     No Medication Administration End Date     Incorrect Dose     Additional Drug Therapy Needed     Significant med changes from prior encounter (inform family/care partners about these prior to discharge).    Other  PTA meds: nitroglycerin patch, semaglutide, renvela Restart PTA meds when and if clinically necessary during CIR admission or at time of discharge    Clinically significant medication issues were identified that warrant physician communication and completion of prescribed/recommended actions by midnight of the next day:  No  Name of provider notified for urgent issues identified:   Provider Method of Notification:     Pharmacist comments:   Time spent performing this drug regimen review (minutes):  30   Jaymason Ledesma BS, PharmD, BCPS Clinical Pharmacist 05/11/2021 11:30 AM

## 2021-05-11 NOTE — Progress Notes (Signed)
Inpatient Rehabilitation Admissions Coordinator   CIR bed is available to admit him to today. I spoke with patient at bedside, contacted Dr [name] by phone as well as spoke with his wife by phone. Freda Munro in hemodialysis made aware of admit. I will alert acute team and TOC to make the arrangements to admit today.  Danne Baxter, RN, MSN Rehab Admissions Coordinator [phone] 05/11/2021 11:16 AM

## 2021-05-11 NOTE — Progress Notes (Signed)
Patient arrived via W/C. Patient lethargic and unable to follow simple commands. Wife at bed side states he has been lethargic all day. Helyn Numbers NP notified. Hold narcotics. Will endorse admit to [facility]

## 2021-05-11 NOTE — H&P (Signed)
Physical Medicine and Rehabilitation Admission H&P   CC: L BKA  HPI: [name] is a 71 year old right-handed male with history of end-stage renal disease with hemodialysis, diabetes mellitus, gout, prostate cancer with TURP 06/28/2020, hypertension atrial flutter with ablation 04/06/2020 per Dr. [name], CABG 2008, quit smoking 32 years ago.  Per chart review lives with spouse.  Multilevel home with one-step to entry.  Modified independent prior to admission.  Presented to 04/27/2021 with pain ulceration deformity left foot status post left fourth and fifth ray amputation 02/02/2021 per Dr. [name].  Limb was not felt to be salvageable underwent left BKA 05/09/2021 per Dr. [name].  Wound VAC applied.  Hospital course follow-up renal service for ongoing hemodialysis.  Acute on chronic anemia latest hemoglobin 9.7.  Therapy evaluations completed due to patient's decreased functional mobility was admitted for a comprehensive rehab program. More lethargic today as per PT.   Review of Systems  Constitutional:  Negative for chills and fever.  HENT:  Negative for hearing loss.   Eyes:  Negative for blurred vision and double vision.  Respiratory:  Negative for cough and shortness of breath.   Cardiovascular:  Positive for leg swelling. Negative for chest pain and palpitations.  Gastrointestinal:  Positive for constipation. Negative for heartburn, nausea and vomiting.       GERD  Genitourinary:  Negative for dysuria, flank pain and hematuria.  Musculoskeletal:  Positive for joint pain and myalgias.  Skin:  Negative for rash.  All other systems reviewed and are negative. Past Medical History:  Diagnosis Date   Ambulates with cane    straight cane   Cervical myelopathy ([place]) 02/06/2018   Chronic kidney disease    dailysis M W F- home   Complication of anesthesia    Coronary artery disease    Diabetes mellitus without complication ([place])    type2   Diabetic foot ulcer ([place])    Diabetic neuropathy  ([place]) 02/06/2018   Gait abnormality 08/24/2018   GERD (gastroesophageal reflux disease)     01/06/20- not current   Gout    History of blood transfusion    History of kidney stones    passed stones   Hypercholesteremia    Hypertension    Hypothyroidism    Neuromuscular disorder (HCC)    neuropathy left leg and bilateral feet   Neuropathy    PONV (postoperative nausea and vomiting)    Prostate cancer (HCC)    PVD (peripheral vascular disease) ([place])    with amputations   Past Surgical History:  Procedure Laterality Date   A-FLUTTER ABLATION N/A 04/06/2020   Procedure: A-FLUTTER ABLATION;  Surgeon: [name], MD;  Location: [facility];  Service: Cardiovascular;  Laterality: N/A;   AMPUTATION Left 12/25/2013   Procedure: AMPUTATION RAY LEFT 5TH RAY;  Surgeon: [name], MD;  Location: [facility];  Service: Orthopedics;  Laterality: Left;   AMPUTATION Right 12/15/2018   Procedure: AMPUTATION OF 4TH AND 5TH TOES RIGHT FOOT;  Surgeon: [name], MD;  Location: [facility];  Service: Orthopedics;  Laterality: Right;   AMPUTATION Left 02/02/2021   Procedure: LEFT FOOT 4TH RAY AMPUTATION;  Surgeon: [name], MD;  Location: [facility];  Service: Orthopedics;  Laterality: Left;   AMPUTATION Left 05/09/2021   Procedure: LEFT BELOW KNEE AMPUTATION;  Surgeon: [name], MD;  Location: [facility];  Service: Orthopedics;  Laterality: Left;   APPLICATION OF WOUND VAC Right 12/15/2018   Procedure: APPLICATION OF  WOUND VAC;  Surgeon: [name], MD;  Location: [facility];  Service: Orthopedics;  Laterality: Right;   AV FISTULA PLACEMENT Left 03/25/2019   Procedure: LEFT ARM ARTERIOVENOUS (AV) FISTULA CREATION;  Surgeon: [name], MD;  Location: [facility];  Service: Vascular;  Laterality: Left;   [place] Left 05/20/2019   Procedure: SECOND STAGE LEFT BASCILIC VEIN TRANSPOSITION;  Surgeon: [name], MD;  Location: [facility];  Service: Vascular;   Laterality: Left;   CARDIAC CATHETERIZATION  02/17/2014   CHOLECYSTECTOMY     COLONOSCOPY  2011   in [place], [place]  2008   CYSTOSCOPY N/A 06/29/2020   Procedure: [place];  Surgeon: [name], MD;  Location: [facility];  Service: Urology;  Laterality: N/A;   CYSTOSCOPY WITH FULGERATION Bilateral 06/17/2020   Procedure: CYSTOSCOPY,BILATERAL RETROGRADE, CLOT EVACUATION WITH FULGERATION OF THE BLADDER;  Surgeon: Janith Lima, MD;  Location: [facility];  Service: Urology;  Laterality: Bilateral;   CYSTOSCOPY WITH FULGERATION N/A 06/28/2020   Procedure: CYSTOSCOPY WITH CLOT EVACUATION AND FULGERATION OF BLEEDERS;  Surgeon: [name], MD;  Location: [facility];  Service: Urology;  Laterality: N/A;  1 HR   I & D EXTREMITY Right 12/15/2018   Procedure: DEBRIDEMENT RIGHT FOOT;  Surgeon: [name], MD;  Location: [facility];  Service: Orthopedics;  Laterality: Right;   I & D EXTREMITY Right 08/27/2019   Procedure: PARTIAL CUBOID EXCISION RIGHT FOOT;  Surgeon: [name], MD;  Location: [facility];  Service: Orthopedics;  Laterality: Right;   I & D EXTREMITY Right 01/07/2020   Procedure: RIGHT FOOT EXCISION INFECTED BONE;  Surgeon: [name], MD;  Location: [facility];  Service: Orthopedics;  Laterality: Right;   NECK SURGERY     novemver 2019   TRANSURETHRAL RESECTION OF PROSTATE N/A 06/28/2020   Procedure: TRANSURETHRAL RESECTION OF THE PROSTATE (TURP);  Surgeon: [name], MD;  Location: [facility];  Service: Urology;  Laterality: N/A;   WISDOM TOOTH EXTRACTION     Family History  Problem Relation Age of Onset   Diabetes Mellitus II Mother    Kidney disease Mother    Diabetes Mellitus II Father    CAD Father    Cancer Father        prostate   Kidney disease Father    Diabetes Mellitus II Brother    Kidney disease Brother    Diabetes Mellitus II Brother    Stomach cancer Brother 4   Kidney disease Brother     Colon cancer Neg Hx    Colon polyps Neg Hx    Esophageal cancer Neg Hx    Rectal cancer Neg Hx    Pancreatic cancer Neg Hx    Social History:  reports that he quit smoking about 32 years ago. His smoking use included cigars. He started smoking about 48 years ago. He has never used smokeless tobacco. He reports that he does not drink alcohol and does not use drugs. Allergies:  Allergies  Allergen Reactions   Mushroom Extract Complex Nausea Only   Facility-Administered Medications Prior to Admission  Medication Dose Route Frequency Provider Last Rate Last Admin   0.9 %  sodium chloride infusion  500 mL Intravenous Continuous Irene Shipper, MD       Medications Prior to Admission  Medication Sig Dispense Refill   acetaminophen (TYLENOL) 500 MG tablet Take 1,000-2,000  mg by mouth 2 (two) times daily as needed for moderate pain.     allopurinol (ZYLOPRIM) 100 MG tablet Take 200 mg by mouth daily.      B Complex-C-Zn-Folic Acid (DIALYVITE 741 WITH ZINC) 0.8 MG TABS Take 1 tablet by mouth daily.     calcitRIOL (ROCALTROL) 0.5 MCG capsule Take 0.5 mcg by mouth daily.     carvedilol (COREG) 12.5 MG tablet Take 1 tablet (12.5 mg total) by mouth in the morning and at bedtime. 180 tablet 3   Lancets (ONETOUCH DELICA PLUS OINOMV67M) MISC      levothyroxine (SYNTHROID, LEVOTHROID) 112 MCG tablet Take 112 mcg by mouth daily before breakfast.     losartan (COZAAR) 100 MG tablet Take 100 mg by mouth daily.     oxyCODONE-acetaminophen (PERCOCET/ROXICET) 5-325 MG tablet Take 1 tablet by mouth every 4 (four) hours as needed. 30 tablet 0   Polyethyl Glycol-Propyl Glycol (LUBRICANT EYE DROPS) 0.4-0.3 % SOLN Place 1-2 drops into both eyes in the morning and at bedtime.     pregabalin (LYRICA) 150 MG capsule Take 1 capsule (150 mg total) by mouth in the morning and at bedtime. (Patient taking differently: Take 150-300 mg by mouth See admin instructions. Take 150 mg in the morning and 300 mg at bedtime) 60  capsule 0   prochlorperazine (COMPAZINE) 5 MG tablet TAKE 1 TABLET BY MOUTH TWICE A DAY 60 tablet 3   rosuvastatin (CRESTOR) 10 MG tablet Take 10 mg by mouth daily.     Semaglutide,0.25 or 0.5MG /DOS, (OZEMPIC, 0.25 OR 0.5 MG/DOSE,) 2 MG/1.5ML SOPN Inject 0.5 mg into the skin every Friday.      Home: Home Living Family/patient expects to be discharged to:: Private residence Living Arrangements: Spouse/significant other, Children Available Help at Discharge: Family, Available 24 hours/day Type of Home: House Home Access: Stairs to enter, Ramped entrance Technical brewer of Steps: 1 Entrance Stairs-Rails: None Home Layout: Multi-level, Laundry or work area in basement [employer] Shower/Tub: Multimedia programmer: Associate Professor Accessibility: Yes Home Equipment: Radiation protection practitioner (4 wheels), [place] - single point, Conservation officer, nature (2 wheels), Wheelchair - manual Additional Comments: wife assists with set up of home HD   Functional History: Prior Function Prior Level of Function : Independent/Modified Independent, Driving, Working/employed Mobility Comments: walking with rollator and cane prior to this admission; h/o NWB to L LE. 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   Functional Status:  Mobility: Bed Mobility Overal bed mobility: Needs Assistance Bed Mobility: Supine to Sit, Sit to Supine Supine to sit: Min guard, HOB elevated Sit to supine: Min guard General bed mobility comments: heavy use of bed rail, increased time and effort; close guarding at EOB Transfers Overall transfer level: Needs assistance Equipment used: Rolling walker (2 wheels) Transfers: Sit to/from Stand Sit to Stand: Mod assist, +2 physical assistance, +2 safety/equipment, From elevated surface General transfer comment: performed 2x sit<>stand from elevated bed height to RW, heavy modA for trunk elevation  and stability, requiring repeated cues for hand placement, difficulty problem solving; pt unable to perform complete hop on RLE; pt with BUE and RLE asterixis(?) resulting in uncontrolled sitting EOB requiring maxA to prevent RW sliding forward and fall   ADL: ADL Overall ADL's : Needs assistance/impaired Eating/Feeding: Set up, Sitting Eating/Feeding Details (indicate cue type and reason): noted with spillage on self/linens on entry (likely from asterixis-type movements) Grooming: Min guard, Sitting Upper Body Bathing: Minimal  assistance, Sitting, Bed level Upper Body Bathing Details (indicate cue type and reason): able to assist in bathing UB after spillage from breakfast Lower Body Bathing: Maximal assistance, Sitting/lateral leans, Bed level Upper Body Dressing : Minimal assistance, Bed level, Sitting Upper Body Dressing Details (indicate cue type and reason): to don new gown, dificulty with coordination Lower Body Dressing: Maximal assistance, Sitting/lateral leans Lower Body Dressing Details (indicate cue type and reason): Max A to don shoe/AFO to [place] and Hygiene: Total assistance, Bed level General ADL Comments: Pt limited by asterixis movements of UE/LEs increasing fall risk sitting/standing attempts. expected post op deficits in balance noted requiring increased assist with LB ADLs   Cognition: Cognition Overall Cognitive Status: Impaired/Different from baseline Orientation Level: Oriented X4 Cognition Arousal/Alertness: Awake/alert Behavior During Therapy: Flat affect Overall Cognitive Status: Impaired/Different from baseline Area of Impairment: Attention, Memory, Following commands, Safety/judgement, Awareness, Problem solving Current Attention Level: Sustained Memory: Decreased short-term memory Following Commands: Follows one step commands with increased time Safety/Judgement: Decreased awareness of safety, Decreased awareness of  deficits Awareness: Emergent Problem Solving: Slow processing, Decreased initiation, Requires verbal cues, Difficulty sequencing, Requires tactile cues General Comments: Pleasant, follows directions relatively consistently. Flat affect and slower processing with need for sequencing, safety and attention cues. Questionable memory deficits as pt with difficulty recalling type of dialysis he is on at home, also unable to recall all of dog's names. does endorse feeling a little foggy after procedure yesterday  Physical Exam: Blood pressure (!) 148/57, pulse 81, temperature 99.3 F (37.4 C), temperature source Oral, resp. rate 16, height 5\' 11"  (1.803 m), weight 96.9 kg, SpO2 94 %. Gen: no distress, normal appearing HEENT: oral mucosa pink and moist, NCAT Cardio: Reg rate Chest: normal effort, normal rate of breathing Abd: soft, non-distended Ext: no edema Psych: pleasant, normal affect Skin:    Comments: Left BKA dressed appropriately tender with wound VAC in place.  Neurological:     Comments: Patient is alert.  Makes eye contact with examiner.  Oriented x3.   Results for orders placed or performed during the hospital encounter of 05/09/21 (from the past 48 hour(s))  Hepatitis B surface antigen     Status: None   Collection Time: 05/09/21  4:24 PM  Result Value Ref Range   Hepatitis B Surface Ag NON REACTIVE NON REACTIVE    Comment: Performed at [facility], [address], [place], [place] [address]  Hepatitis B surface antibody     Status: Abnormal   Collection Time: 05/09/21  4:24 PM  Result Value Ref Range   Hep B S Ab Reactive (A) NON REACTIVE    Comment: (NOTE) Consistent with immunity, greater than 9.9 mIU/mL.  Performed at [facility], [place] [address], [place], [place] [address]   Hepatitis B surface antibody,quantitative     Status: None   Collection Time: 05/09/21  4:24 PM  Result Value Ref Range   Hepatitis B-Post 106.9 Immunity>9.9 mIU/mL    Comment:  (NOTE)  Status of Immunity                     Anti-HBs Level  ------------------                     -------------- Inconsistent with Immunity                   0.0 - 9.9 Consistent with Immunity                          >  9.9 Performed At: [facility] [place], [place] [id number] Rush Farmer MD [id number]   Glucose, capillary     Status: Abnormal   Collection Time: 05/09/21  4:37 PM  Result Value Ref Range   Glucose-Capillary 133 (H) 70 - 99 mg/dL    Comment: Glucose reference range applies only to samples taken after fasting for at least 8 hours.  Glucose, capillary     Status: Abnormal   Collection Time: 05/09/21  8:06 PM  Result Value Ref Range   Glucose-Capillary 146 (H) 70 - 99 mg/dL    Comment: Glucose reference range applies only to samples taken after fasting for at least 8 hours.  CBC     Status: Abnormal   Collection Time: 05/10/21  2:09 AM  Result Value Ref Range   WBC 8.0 4.0 - 10.5 K/uL   RBC 3.08 (L) 4.22 - 5.81 MIL/uL   Hemoglobin 9.4 (L) 13.0 - 17.0 g/dL   HCT 28.3 (L) 39.0 - 52.0 %   MCV 91.9 80.0 - 100.0 fL   MCH 30.5 26.0 - 34.0 pg   MCHC 33.2 30.0 - 36.0 g/dL   RDW 16.5 (H) 11.5 - 15.5 %   Platelets 123 (L) 150 - 400 K/uL    Comment: Immature Platelet Fraction may be clinically indicated, consider ordering this additional test [id number] REPEATED TO VERIFY    nRBC 0.3 (H) 0.0 - 0.2 %    Comment: Performed at [facility], [place] [address] [place], [place] [address]  Renal function panel     Status: Abnormal   Collection Time: 05/10/21  2:09 AM  Result Value Ref Range   Sodium 132 (L) 135 - 145 mmol/L   Potassium 4.6 3.5 - 5.1 mmol/L   Chloride 95 (L) 98 - 111 mmol/L   CO2 21 (L) 22 - 32 mmol/L   Glucose, Bld 136 (H) 70 - 99 mg/dL    Comment: Glucose reference range applies only to samples taken after fasting for at least 8 hours.   BUN 48 (H) 8 - 23 mg/dL   Creatinine, Ser 5.35 (H) 0.61 - 1.24 mg/dL   Calcium 8.4  (L) 8.9 - 10.3 mg/dL   Phosphorus 4.1 2.5 - 4.6 mg/dL   Albumin 3.3 (L) 3.5 - 5.0 g/dL   GFR, Estimated 11 (L) >60 mL/min    Comment: (NOTE) Calculated using the CKD-EPI Creatinine Equation (2021)    Anion gap 16 (H) 5 - 15    Comment: Performed at [facility] [address] [place],  [address]  Glucose, capillary     Status: Abnormal   Collection Time: 05/10/21  8:27 AM  Result Value Ref Range   Glucose-Capillary 141 (H) 70 - 99 mg/dL    Comment: Glucose reference range applies only to samples taken after fasting for at least 8 hours.  Glucose, capillary     Status: Abnormal   Collection Time: 05/10/21 11:47 AM  Result Value Ref Range   Glucose-Capillary 151 (H) 70 - 99 mg/dL    Comment: Glucose reference range applies only to samples taken after fasting for at least 8 hours.  Glucose, capillary     Status: Abnormal   Collection Time: 05/10/21  8:24 PM  Result Value Ref Range   Glucose-Capillary 144 (H) 70 - 99 mg/dL    Comment: Glucose reference range applies only to samples taken after fasting for at least 8 hours.  CBC     Status: Abnormal  Collection Time: 05/11/21  1:58 AM  Result Value Ref Range   WBC 6.7 4.0 - 10.5 K/uL   RBC 3.15 (L) 4.22 - 5.81 MIL/uL   Hemoglobin 9.7 (L) 13.0 - 17.0 g/dL   HCT 28.7 (L) 39.0 - 52.0 %   MCV 91.1 80.0 - 100.0 fL   MCH 30.8 26.0 - 34.0 pg   MCHC 33.8 30.0 - 36.0 g/dL   RDW 16.6 (H) 11.5 - 15.5 %   Platelets 107 (L) 150 - 400 K/uL    Comment: Immature Platelet Fraction may be clinically indicated, consider ordering this additional test [id number] REPEATED TO VERIFY PLATELET COUNT CONFIRMED BY SMEAR PLATELET CLUMPS NOTED ON SMEAR, COUNT APPEARS ADEQUATE    nRBC 0.0 0.0 - 0.2 %    Comment: Performed at [facility], [address] [place], [place] [address]  Glucose, capillary     Status: Abnormal   Collection Time: 05/11/21  6:43 AM  Result Value Ref Range   Glucose-Capillary 151 (H) 70 - 99 mg/dL     Comment: Glucose reference range applies only to samples taken after fasting for at least 8 hours.  Glucose, capillary     Status: Abnormal   Collection Time: 05/11/21  8:21 AM  Result Value Ref Range   Glucose-Capillary 142 (H) 70 - 99 mg/dL    Comment: Glucose reference range applies only to samples taken after fasting for at least 8 hours.  Glucose, capillary     Status: Abnormal   Collection Time: 05/11/21 11:26 AM  Result Value Ref Range   Glucose-Capillary 143 (H) 70 - 99 mg/dL    Comment: Glucose reference range applies only to samples taken after fasting for at least 8 hours.   No results found.    Blood pressure (!) 148/57, pulse 81, temperature 99.3 F (37.4 C), temperature source Oral, resp. rate 16, height 5\' 11"  (1.803 m), weight 96.9 kg, SpO2 94 %.  Medical Problem List and Plan: 1. Functional deficits secondary to left BKA 05/09/2021 secondary to peripheral vascular disease poor healing of recent left fourth and fifth ray amputation  -patient may shower but incision must be covered  -ELOS/Goals: 8-12 days S  Admit to CIR 2.  Antithrombotics: -DVT/anticoagulation:  Mechanical: Antiembolism stockings, thigh (TED hose) Right lower extremity  -antiplatelet therapy: N/A 3. Postoperative pain: hold opioid medications due to sedation. Lyrica 150 mg daily and 300 mg nightly, oxycodone as needed 4. Mood: Provide emotional support  -antipsychotic agents: N/A 5. Neuropsych: This patient is capable of making decisions on his own behalf. 6. Skin/Wound Care: Routine skin checks 7. Fluids/Electrolytes/Nutrition: Routine in and outs with follow-up chemistries 8.  End-stage renal disease.  Continue hemodialysis as per renal services. 9.  Acute on chronic anemia.  Follow-up CBC 10.  Hypertension.  Cozaar 100 mg daily, Coreg 12.5 mg twice daily.  Monitor with increased mobility 11.  Diabetes mellitus with peripheral neuropathy.  Hemoglobin A1c 5.5.  NovoLog 2 units 3 times daily 12.   Hypothyroidism.  Synthroid 13.  Hyperlipidemia.  Crestor 14.  History of gout.  Continue allopurinol.  Monitor for any gout flareups 15.  History of atrial flutter with ablation 04/06/2020.  Follow-up cardiology services as needed.  Cardiac rate controlled 16.  History of prostate cancer status post TURP 06/28/2020.  Follow-up outpatient 17.  Obesity.  BMI 29.99.  Dietary follow-up 18.  GERD.  Protonix 19. Sedation: hold opioid medications 20. Vitamin D deficiency: start ergocalciferol 50,000U once per week for 7  weeks.  I have personally performed a face to face diagnostic evaluation, including, but not limited to relevant history and physical exam findings, of this patient and developed relevant assessment and plan.  Additionally, I have reviewed and concur with the physician assistant's documentation above.  Lavon Paganini Angiulli, PA-C   [name], MD 05/11/2021

## 2021-05-11 NOTE — Progress Notes (Signed)
Contacted [facility] therapy unit to advise staff that pt will d/c to CIR today. Spoke to [place], Therapist, sports. Will assist as needed.   Melven Sartorius Renal Navigator [phone]

## 2021-05-11 NOTE — Progress Notes (Signed)
[facility] Progress Note   Subjective:  Seen in room. C/o leg pain, but otherwise no concerns. No HD issues yesterday. No CP or dyspnea. Looks like was febrile last night, a little better today.  Objective Vitals:   05/10/21 1840 05/10/21 1850 05/10/21 2200 05/11/21 0812  BP:  (!) 159/63 (!) 125/53 (!) 146/62  Pulse:  90 60 68  Resp:  17 18 18   Temp: (!) 101.3 F (38.5 C) 100.3 F (37.9 C) 99.9 F (37.7 C) 99.2 F (37.3 C)  TempSrc: Oral Oral Axillary Oral  SpO2:  95% 96% 98%  Weight:      Height:       Physical Exam General: Well appearing man, NAD. Room air. Heart: RRR; no murmur Lungs: CTAB; no rales or wheezing Abdomen: soft Extremities: L BKA in hard brace, no RLE edema Dialysis Access: LUE AVF + bruit  Additional Objective Labs: Basic Metabolic Panel: Recent Labs  Lab 05/09/21 0724 05/09/21 0752 05/10/21 0209  NA 134* 134* 132*  K 4.0 4.0 4.6  CL 96* 95* 95*  CO2 27  --  21*  GLUCOSE 144* 139* 136*  BUN 27* 26* 48*  CREATININE 4.14* 4.30* 5.35*  CALCIUM 8.9  --  8.4*  PHOS  --   --  4.1   Liver Function Tests: Recent Labs  Lab 05/09/21 0724 05/10/21 0209  AST 21  --   ALT 18  --   ALKPHOS 73  --   BILITOT 0.9  --   PROT 6.4*  --   ALBUMIN 4.0 3.3*   CBC: Recent Labs  Lab 05/04/21 1042 05/09/21 0724 05/09/21 0752 05/10/21 0209 05/11/21 0158  WBC 7.5 5.0  --  8.0 6.7  NEUTROABS  --  3.8  --   --   --   HGB 10.0* 9.6* 9.5* 9.4* 9.7*  HCT 29.2* 28.3* 28.0* 28.3* 28.7*  MCV 89.8 90.7  --  91.9 91.1  PLT 123* 100*  --  123* 107*   Medications:  sodium chloride 10 mL/hr at 05/09/21 1054   magnesium sulfate bolus IVPB      allopurinol  200 mg Oral Daily   vitamin C  1,000 mg Oral Daily   calcitRIOL  0.5 mcg Oral Daily   carvedilol  12.5 mg Oral BID WC   Chlorhexidine Gluconate Cloth  6 each Topical Q0600   docusate sodium  100 mg Oral Daily   insulin aspart  0-6 Units Subcutaneous TID WC   insulin aspart  2 Units  Subcutaneous TID WC   levothyroxine  112 mcg Oral QAC breakfast   losartan  100 mg Oral Daily   nutrition supplement (JUVEN)  1 packet Oral BID BM   pantoprazole  40 mg Oral Daily   polyvinyl alcohol  1-2 drop Both Eyes Daily   pregabalin  150 mg Oral Daily   pregabalin  300 mg Oral QHS   prochlorperazine  5 mg Oral BID   rosuvastatin  10 mg Oral Daily   zinc sulfate  220 mg Oral Daily    Dialysis Orders: Home HD: [facility] 4 times per week. 2K lactate 45, AVF with buttonholes, 15g. EDW 89kg. No heparin - Mircera 140mcg subcutaneous, last given 05/03/21   Assessment/Plan:  L Diabetic foot ulcer: S/p L BKA on 05/09/21 by Dr. [name]. Noted plan is for inpatient rehab, will need to arrange temporary in center dialysis if patient goes to a SNF.  ESRD: Usually 4d/week at home, following TTS schedule during admit ->  next HD tomorrow.  Hypertension/volume: BP borderline high, on Losartan and Coreg. Per weights, she is 7-8kg up, although does not appear to the case clinically -> UF as tolerated with next HD.  Anemia: Hgb 9.7, not due for ESA yet.  Metabolic bone disease: Ca/Phos ok. Continue calcitriol and renvela.   Nutrition:  Alb 3.3, on protein supplement.  Diabetes mellitus: on insulin, management per primary team  Veneta Penton, PA-C 05/11/2021, 9:39 AM  [facility]

## 2021-05-11 NOTE — Evaluation (Signed)
Occupational Therapy Assessment and Plan  Patient Details  Name: [name] MRN: [id number] Date of Birth: Mar 26, 1951  OT Diagnosis: abnormal posture, acute pain, cognitive deficits, disturbance of vision, and muscle weakness (generalized) Rehab Potential: Rehab Potential (ACUTE ONLY): Good ELOS: 14-18 days   Today's Date: 05/12/2021 OT Individual Time: 5056-9794 OT Individual Time Calculation (min): 66 min     Hospital Problem: Principal Problem:   Left below-knee amputee [facility])   Past Medical History:  Past Medical History:  Diagnosis Date   Ambulates with cane    straight cane   Cervical myelopathy ([place]) 02/06/2018   Chronic kidney disease    dailysis M W F- home   Complication of anesthesia    Coronary artery disease    Diabetes mellitus without complication ([place])    type2   Diabetic foot ulcer ([place])    Diabetic neuropathy ([place]) 02/06/2018   Gait abnormality 08/24/2018   GERD (gastroesophageal reflux disease)     01/06/20- not current   Gout    History of blood transfusion    History of kidney stones    passed stones   Hypercholesteremia    Hypertension    Hypothyroidism    Neuromuscular disorder (HCC)    neuropathy left leg and bilateral feet   Neuropathy    PONV (postoperative nausea and vomiting)    Prostate cancer (HCC)    PVD (peripheral vascular disease) ([place])    with amputations   Past Surgical History:  Past Surgical History:  Procedure Laterality Date   A-FLUTTER ABLATION N/A 04/06/2020   Procedure: A-FLUTTER ABLATION;  Surgeon: [name], MD;  Location: [place] [facility];  Service: Cardiovascular;  Laterality: N/A;   AMPUTATION Left 12/25/2013   Procedure: AMPUTATION RAY LEFT 5TH RAY;  Surgeon: [name], MD;  Location: [facility];  Service: Orthopedics;  Laterality: Left;   AMPUTATION Right 12/15/2018   Procedure: AMPUTATION OF 4TH AND 5TH TOES RIGHT FOOT;  Surgeon: [name], MD;  Location: [place];  Service: Orthopedics;  Laterality:  Right;   AMPUTATION Left 02/02/2021   Procedure: LEFT FOOT 4TH RAY AMPUTATION;  Surgeon: [name], MD;  Location: [place];  Service: Orthopedics;  Laterality: Left;   AMPUTATION Left 05/09/2021   Procedure: LEFT BELOW KNEE AMPUTATION;  Surgeon: [name], MD;  Location: [place];  Service: Orthopedics;  Laterality: Left;   APPLICATION OF WOUND VAC Right 12/15/2018   Procedure: APPLICATION OF WOUND VAC;  Surgeon: [name], MD;  Location: [place];  Service: Orthopedics;  Laterality: Right;   AV FISTULA PLACEMENT Left 03/25/2019   Procedure: LEFT ARM ARTERIOVENOUS (AV) FISTULA CREATION;  Surgeon: [name], MD;  Location: [place] [facility];  Service: Vascular;  Laterality: Left;   [name] Left 05/20/2019   Procedure: SECOND STAGE LEFT BASCILIC VEIN TRANSPOSITION;  Surgeon: [name], MD;  Location: [place] [facility];  Service: Vascular;  Laterality: Left;   CARDIAC CATHETERIZATION  02/17/2014   CHOLECYSTECTOMY     COLONOSCOPY  2011   in [place], [place]  2008   CYSTOSCOPY N/A 06/29/2020   Procedure: [place];  Surgeon: [name], MD;  Location: [place];  Service: Urology;  Laterality: N/A;   CYSTOSCOPY WITH FULGERATION Bilateral 06/17/2020   Procedure: CYSTOSCOPY,BILATERAL RETROGRADE, CLOT EVACUATION WITH FULGERATION OF THE BLADDER;  Surgeon: Janith Lima, MD;  Location: [place];  Service: Urology;  Laterality: Bilateral;   CYSTOSCOPY WITH FULGERATION N/A 06/28/2020   Procedure: CYSTOSCOPY WITH CLOT EVACUATION AND FULGERATION OF BLEEDERS;  Surgeon: [name], MD;  Location: [place];  Service: Urology;  Laterality: N/A;  1 HR   I & D EXTREMITY Right 12/15/2018   Procedure: DEBRIDEMENT RIGHT FOOT;  Surgeon: [name], MD;  Location: [place];  Service: Orthopedics;  Laterality: Right;   I & D EXTREMITY Right 08/27/2019   Procedure: PARTIAL CUBOID EXCISION RIGHT FOOT;  Surgeon:  [name], MD;  Location: [place];  Service: Orthopedics;  Laterality: Right;   I & D EXTREMITY Right 01/07/2020   Procedure: RIGHT FOOT EXCISION INFECTED BONE;  Surgeon: [name], MD;  Location: [place];  Service: Orthopedics;  Laterality: Right;   NECK SURGERY     novemver 2019   TRANSURETHRAL RESECTION OF PROSTATE N/A 06/28/2020   Procedure: TRANSURETHRAL RESECTION OF THE PROSTATE (TURP);  Surgeon: [name], MD;  Location: [place];  Service: Urology;  Laterality: N/A;   WISDOM TOOTH EXTRACTION      Assessment & Plan Clinical Impression: [name] is a 71 year old right-handed male with history of end-stage renal disease with hemodialysis, diabetes mellitus, gout, prostate cancer with TURP 06/28/2020, hypertension atrial flutter with ablation 04/06/2020 per Dr. [name], CABG 2008, quit smoking 32 years ago.  Per chart review lives with spouse.  Multilevel home with one-step to entry.  Modified independent prior to admission.  Presented to 04/27/2021 with pain ulceration deformity left foot status post left fourth and fifth ray amputation 02/02/2021 per Dr. [name].  Limb was not felt to be salvageable underwent left BKA 05/09/2021 per Dr. [name].  Wound VAC applied.  Hospital course follow-up renal service for ongoing hemodialysis.  Acute on chronic anemia latest hemoglobin 9.7.  Therapy evaluations completed due to patient's decreased functional mobility was admitted for a comprehensive rehab program. More lethargic today as per PT.   Patient currently requires mod with basic self-care skills secondary to muscle weakness, decreased cardiorespiratoy endurance, decreased visual acuity, decreased initiation, decreased attention, decreased awareness, decreased problem solving, decreased safety awareness, and delayed processing, and decreased sitting balance, decreased postural control, and decreased balance strategies.  Prior to hospitalization, patient could complete BADLs with modified  independent -CGA.  Patient will benefit from skilled intervention to increase independence with basic self-care skills prior to discharge home in care of wife.  Anticipate patient will require 24 hour supervision and minimal physical assistance and follow up home health.  OT - End of Session Endurance Deficit: Yes Endurance Deficit Description: Very deconditioned, required frequent rest breaks, declined attempting sit<stand after 3-4 failed attempts due to decreased endurance OT Assessment Rehab Potential (ACUTE ONLY): Good OT Barriers to Discharge: Wound Care OT Patient demonstrates impairments in the following area(s): Balance;Skin Integrity;Vision;Cognition;Endurance;Motor;Pain;Safety OT Basic ADL's Functional Problem(s): Grooming;Bathing;Dressing;Toileting OT Advanced ADL's Functional Problem(s): Simple Meal Preparation OT Transfers Functional Problem(s): Toilet;Tub/Shower OT Additional Impairment(s): None OT Plan OT Intensity: Minimum of 1-2 x/day, 45 to 90 minutes OT Frequency: 5 out of 7 days OT Duration/Estimated Length of Stay: 14-18 days OT Treatment/Interventions: Balance/vestibular training;Disease mangement/prevention;Self Care/advanced ADL retraining;Therapeutic Exercise;Wheelchair propulsion/positioning;UE/LE Strength taining/ROM;Skin care/wound managment;Pain management;DME/adaptive equipment instruction;Cognitive remediation/compensation;Community reintegration;Patient/family education;Splinting/orthotics;UE/LE Coordination activities;Therapeutic Activities;Psychosocial support;Functional mobility training;Discharge planning;Visual/perceptual remediation/compensation OT Self Feeding Anticipated Outcome(s): No goal OT Basic Self-Care Anticipated Outcome(s): Supervision OT Toileting Anticipated Outcome(s): Supervision OT Bathroom Transfers Anticipated Outcome(s): CGA OT Recommendation Recommendations for Other Services: Speech consult;Neuropsych consult Patient destination:  Home Follow Up  Recommendations: Home health OT Equipment Recommended: To be determined   OT Evaluation Precautions/Restrictions  Precautions Precautions: Fall Precaution Comments: wound vac L LE Required Braces or Orthoses: Other Brace Other Brace: limb protector L LE, AFO with shoe component to R LE Restrictions Weight Bearing Restrictions: Yes LLE Weight Bearing: Non weight bearing General   Vital Signs  Pain Pain Assessment Pain Scale: 0-10 Pain Score: 5  Pain Location: Leg Pain Orientation: Left;Other (Comment) (at amputation site) Pain Frequency: Constant Home Living/Prior [place] expects to be discharged to:: Private residence Living Arrangements: Spouse/significant other, Children Available Help at Discharge: Family, Available PRN/intermittently Type of Home: House Home Access: Ramped entrance Home Layout: Multi-level, Able to live on main level with bedroom/bathroom, Full bath on main level Bathroom Shower/Tub: Multimedia programmer: Standard Bathroom Accessibility: Yes Additional Comments: wife assists with set up of home HD  Lives With: Spouse, Son IADL History Homemaking Responsibilities: No (per pt, wife took care of IADL responsibilities) Occupation: Part time employment 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 Leisure and Hobbies: reading history books Prior Function Level of Independence: Requires assistive device for independence, Needs assistance with ADLs, Needs assistance with gait (CGA for shower transfers PTA, pt reported also being w/c level at home, performing stand pivot transfers only)  Able to Take Stairs?: No Driving: No Vocation: Part time employment 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 Vision Baseline Vision/History: 1 Wears glasses Ability to See in Adequate Light: 1 Impaired (noted to be most prevalant with items of little contrast) Patient Visual Report: No  change from baseline Vision Assessment?: Vision impaired- to be further tested in functional context Perception  Perception: Impaired Inattention/Neglect: Impaired-to be further tested in functional context;Does not attend to right side of body Praxis Praxis: Impaired Praxis Impairment Details: Motor planning;Initiation;Perseveration Cognition Overall Cognitive Status: No family/caregiver present to determine baseline cognitive functioning Arousal/Alertness: Awake/alert Orientation Level: Person;Place;Situation Person: Oriented Place: Oriented Situation: Oriented Year: 2024 Month: February Day of Week: Correct Immediate Memory Recall: Sock;Bed;Blue Memory Recall Sock: With Cue Memory Recall Blue: Without Cue Memory Recall Bed: Without Cue Awareness: Impaired Problem Solving: Impaired Safety/Judgment: Impaired Comments: Due to deficits in sustained attention, general task awareness, motor planning, and initiation Sensation Sensation Light Touch: Impaired Detail Peripheral sensation comments: Impaired distally RLE Light Touch Impaired Details: Impaired RLE Coordination Gross Motor Movements are Fluid and Coordinated: No Fine Motor Movements are Fluid and Coordinated: No Coordination and Movement Description: Coordination affected by pain and balance deficits s/p toe amputations and limb loss Finger Nose Finger Test: Decreased speed Heel Shin Test: NT Motor  Motor Motor: Abnormal postural alignment and control Motor - Skilled Clinical Observations: Affected by pain, asterixis  Trunk/Postural Assessment  Cervical Assessment Cervical Assessment: Exceptions to [facility] (forward head) Thoracic Assessment Thoracic Assessment: Exceptions to [facility] (rounded shoulders) Lumbar Assessment Lumbar Assessment: Exceptions to [facility] (posterior pelvic tilt) Postural Control Postural Control: Deficits on evaluation (impaired in sitting during functional activity) Head Control: Poor Trunk Control:  Unable to maintain midline orientation or upright posture without BUE support Righting Reactions: Delayed Protective Responses: Delayed  Balance Balance Balance Assessed: Yes Static Sitting Balance Static Sitting - Balance Support: Bilateral upper extremity supported;Feet supported Static Sitting - Level of Assistance: 4: Min assist Static Sitting - Comment/# of Minutes: 5 Dynamic Sitting Balance Dynamic Sitting - Balance Support: During functional activity;Bilateral upper extremity supported;Feet supported (doffing sock) Dynamic Sitting - Level of Assistance: 4: Min assist Dynamic Sitting Balance - Compensations: Must  use BUEs to maintain balance, demonstrated posterior lean Dynamic Sitting - Balance Activities: Lateral lean/weight shifting;Forward lean/weight shifting;Trunk control activities Sitting balance - Comments: reliant on UE support Extremity/Trunk Assessment RUE Assessment RUE Assessment: Within Functional Limits Active Range of Motion (AROM) Comments: WNL LUE Assessment LUE Assessment: Within Functional Limits Active Range of Motion (AROM) Comments: WNL  Care Tool Care Tool Self Care Eating    Not assessed    Oral Care    Oral Care Assist Level: Supervision/Verbal cueing    Bathing   Body parts bathed by patient: Left arm;Right arm;Chest;Abdomen;Front perineal area;Face;Right lower leg;Right upper leg Body parts bathed by helper: Buttocks Body parts n/a: Left upper leg;Left lower leg Assist Level: Moderate Assistance - Patient 50 - 74%    Upper Body Dressing(including orthotics)   What is the patient wearing?: Pull over shirt   Assist Level: Moderate Assistance - Patient 50 - 74%    Lower Body Dressing (excluding footwear)   What is the patient wearing?: Underwear/pull up;Pants Assist for lower body dressing: Maximal Assistance - Patient 25 - 49%    Putting on/Taking off footwear   What is the patient wearing?: Non-skid slipper socks;Shoes;Ted hose Assist  for footwear: Maximal Assistance - Patient 25 - 49%       Care Tool Toileting Toileting activity Toileting Activity did not occur (Clothing management and hygiene only): N/A (no void or bm)       Care Tool Bed Mobility Roll left and right activity   Roll left and right assist level: Independent with assistive device Roll left and right assistive device comment: Bedrail  Sit to lying activity   Sit to lying assist level: Contact Guard/Touching assist    Lying to sitting on side of bed activity   Lying to sitting on side of bed assist level: the ability to move from lying on the back to sitting on the side of the bed with no back support.: Contact Guard/Touching assist     Care Tool Transfers Sit to stand transfer Sit to stand activity did not occur: Safety/medical concerns (Poor motor planning, LLE NWB, Orthotic shoe on RLE, pain)      Chair/bed transfer   Chair/bed transfer assist level: 2 Helpers Contractor)     Materials engineer transfer activity did not occur: N/A (not attempted due to time constraints)       Care Tool Cognition  Expression of Ideas and Wants Expression of Ideas and Wants: 4. Without difficulty (complex and basic) - expresses complex messages without difficulty and with speech that is clear and easy to understand  Understanding Verbal and Non-Verbal Content Understanding Verbal and Non-Verbal Content: 3. Usually understands - understands most conversations, but misses some part/intent of message. Requires cues at times to understand   Memory/Recall Ability Memory/Recall Ability : That he or she is in a hospital/hospital unit;Current season   Refer to Care Plan for Long Term Goals  SHORT TERM GOAL WEEK 1 OT Short Term Goal 1 (Week 1): Pt will complete 1/3 components of toileting with CGA OT Short Term Goal 2 (Week 1): Pt will complete LB dressing at sit<stand level with 1 assist OT Short Term Goal 3 (Week 1): Pt will complete UB self care EOB with  no more than supervision assist for sitting balance  Recommendations for other services: Neuropsych and Other: SLP    Skilled Therapeutic Intervention Skilled OT session completed with focus on initial evaluation, education on OT role/POC, and establishment of patient-centered goals.  Pt greeted in bed, agreeable to session. CGA for supine<sit with pt needing increased time to motor plan/process instruction. Once EOB pt with multiple seated LOBs, mostly posterioraly. He did have a tendency to scoot himself more towards the base of the bed so this might have made balance seem more impaired than it was due to descrepencies with mattress firmness in base of bed vs middle of it. Pt with visual and and cognitive impairments, when cued to wash his face placed fingers into soap water and rubbed his cheek with it. Cues to locate wash cloths and pt tried to remove chuck pad from under him thinking that this was a wash cloth. Difficultly with locating needed items with decreased contrast but able to read labels on ADL items (soap/shampoo and deodorant). Also able to read items on his menu for dietary tech. Supervision for scooting towards [facility] and we tried to stand x4 using RW, pt wearing his Rt AFO with shoe component due to Rt toe amputations/deformity. Ultimately unable to stand with pt needing significantly increased time to motor plan and would not carryover instruction from OT in regards to techniques to help him stand. LB self care completed bedlevel with pt rolling Rt>Lt with Min A, also able to perform 1 legged bridge to pull underwear + pants over hips. He remained in bed at close of session, all needs within reach and bed alarm set.   ADL ADL Grooming: Supervision/safety Where Assessed-Grooming: Edge of bed Upper Body Bathing: Supervision/safety Where Assessed-Upper Body Bathing: Edge of bed Lower Body Bathing: Maximal assistance Where Assessed-Lower Body Bathing: Edge of bed Upper Body Dressing:  Moderate assistance Where Assessed-Upper Body Dressing: Edge of bed Lower Body Dressing: Maximal assistance Where Assessed-Lower Body Dressing: Edge of bed Toileting: Not assessed Toilet Transfer: Not assessed Tub/Shower Transfer: Not assessed Mobility  Bed Mobility Bed Mobility: Rolling Right;Rolling Left;Supine to Sit;Sitting - Scoot to [employer] of Bed Rolling Right: Independent with assistive device Rolling Left: Independent with assistive device Supine to Sit: Contact Guard/Touching assist Sitting - Scoot to Edge of Bed: Contact Guard/Touching assist   Discharge Criteria: Patient will be discharged from OT if patient refuses treatment 3 consecutive times without medical reason, if treatment goals not met, if there is a change in medical status, if patient makes no progress towards goals or if patient is discharged from hospital.  The above assessment, treatment plan, treatment alternatives and goals were discussed and mutually agreed upon: by patient  Skeet Simmer 05/12/2021, 12:56 PM

## 2021-05-12 DIAGNOSIS — Z89512 Acquired absence of left leg below knee: Secondary | ICD-10-CM

## 2021-05-12 LAB — GLUCOSE, CAPILLARY
Glucose-Capillary: 154 mg/dL — ABNORMAL HIGH (ref 70–99)
Glucose-Capillary: 146 mg/dL — ABNORMAL HIGH (ref 70–99)
Glucose-Capillary: 186 mg/dL — ABNORMAL HIGH (ref 70–99)
Glucose-Capillary: 161 mg/dL — ABNORMAL HIGH (ref 70–99)

## 2021-05-12 MED ORDER — DOCUSATE SODIUM 100 MG PO CAPS
100.0000 mg | ORAL_CAPSULE | Freq: Two times a day (BID) | ORAL | Status: DC
  Administered 2021-05-12 – 2021-05-13 (×2): 100 mg via ORAL
  Filled 2021-05-12 (×2): qty 1

## 2021-05-12 MED ORDER — SEMAGLUTIDE(0.25 OR 0.5MG/DOS) 2 MG/1.5ML ~~LOC~~ SOPN
0.5000 mg | PEN_INJECTOR | SUBCUTANEOUS | Status: DC
  Administered 2021-05-15 – 2021-05-22 (×2): 0.5 mg via SUBCUTANEOUS
  Filled 2021-05-12 (×4): qty 0.38

## 2021-05-12 MED ORDER — SEVELAMER CARBONATE 800 MG PO TABS
800.0000 mg | ORAL_TABLET | Freq: Three times a day (TID) | ORAL | Status: DC
  Administered 2021-05-12 – 2021-05-24 (×34): 800 mg via ORAL
  Filled 2021-05-12 (×35): qty 1

## 2021-05-12 MED ORDER — NON FORMULARY: 0.5000 mg | Status: DC

## 2021-05-12 NOTE — Progress Notes (Signed)
Physical Therapy Session Note  Patient Details  Name: [name] MRN: [id number] Date of Birth: 03-27-1951  Today's Date: 05/12/2021 PT Individual Time: 1100-1202 PT Individual Time Calculation (min): 62 min   Short Term Goals: Week 1:  PT Short Term Goal 1 (Week 1): Pt will perform sit <>stand w/LRAD and mod A PT Short Term Goal 2 (Week 1): Pt will perform bed <>chair transfer w/LRAD and mod A PT Short Term Goal 3 (Week 1): Pt will initiate gait training w/mod A and LRAD  Skilled Therapeutic Interventions/Progress Updates:  Pt received seated in WC in room, L limb guard and R orthotic shoe donned, reported 8/10 pain in L BKA and was premedicated. Offered repositioning and distraction throughout session for pain modulation. Emphasis of session on WC mobility and sit <>stand transfers. Pt self-propelled >150' w/BUEs and S* slowly, noted poor hand placement on rims, significant R path deviations and inefficient propulsion. Provided min verbal cues for improved hand placement for improved propulsion, which pt ignored.   In / bars, pt performed 4 sit <>stands w/min A for trunk support and balance. Mod verbal cues for hand placement, as pt pulls himself up on // bars. However, pt unable to motor plan how to push up from [employer]. Noted significant R lean against rail, min tactile cues provided for lateral weight shift to L side. Pt able to hold stand for 60s-90s at a time which faded to 30s as he fatigued. Pt performed 10 hip flexion and 10 hip abduction of LLE while standing w/min A for stabilization for improved L hip and glute strength. Noted significant inversion of R foot despite orthotic, attempted to tighten leather strap to provide increased lateral support and promote neutral calcaneal position with minor success.   Pt reported urgency to have BM, so pt transported back to room w/total A for time management. Sit <>stand via Stedy w/min A and pt transported to [facility] via Stedy, mod verbal cues to not  place weight on L BKA against stedy frame. Stand <>sit to [facility] w/Stedy and 2 helpers for safety and eccentric control and pt was left on BSC in room, nursing present, all needs in reach.   Therapy Documentation Precautions:  Precautions Precautions: Fall Precaution Comments: wound vac L LE Required Braces or Orthoses: Other Brace Other Brace: limb protector L LE, AFO to R LE Restrictions Weight Bearing Restrictions: Yes LLE Weight Bearing: Non weight bearing   Therapy/Group: Individual Therapy Cruzita Lederer Candance Bohlman, PT, DPT  05/12/2021, 11:26 AM

## 2021-05-12 NOTE — Plan of Care (Signed)
°  Problem: RH Balance Goal: LTG Patient will maintain dynamic sitting balance (PT) Description: LTG:  Patient will maintain dynamic sitting balance with assistance during mobility activities (PT) Flowsheets (Taken 05/12/2021 1228) LTG: Pt will maintain dynamic sitting balance during mobility activities with:: Supervision/Verbal cueing Goal: LTG Patient will maintain dynamic standing balance (PT) Description: LTG:  Patient will maintain dynamic standing balance with assistance during mobility activities (PT) Flowsheets (Taken 05/12/2021 1228) LTG: Pt will maintain dynamic standing balance during mobility activities with:: (LRAD) Contact Guard/Touching assist   Problem: Sit to Stand Goal: LTG:  Patient will perform sit to stand with assistance level (PT) Description: LTG:  Patient will perform sit to stand with assistance level (PT) Flowsheets (Taken 05/12/2021 1228) LTG: PT will perform sit to stand in preparation for functional mobility with assistance level: (LRAD) Contact Guard/Touching assist   Problem: RH Bed to Chair Transfers Goal: LTG Patient will perform bed/chair transfers w/assist (PT) Description: LTG: Patient will perform bed to chair transfers with assistance (PT). Flowsheets (Taken 05/12/2021 1228) LTG: Pt will perform Bed to Chair Transfers with assistance level: (LRAD) Contact Guard/Touching assist   Problem: RH Car Transfers Goal: LTG Patient will perform car transfers with assist (PT) Description: LTG: Patient will perform car transfers with assistance (PT). Flowsheets (Taken 05/12/2021 1228) LTG: Pt will perform car transfers with assist:: (LRAD) Minimal Assistance - Patient > 75%   Problem: RH Ambulation Goal: LTG Patient will ambulate in home environment (PT) Description: LTG: Patient will ambulate in home environment, # of feet with assistance (PT). Flowsheets (Taken 05/12/2021 1228) LTG: Pt will ambulate in home environ  assist needed:: (LRAD) Minimal Assistance - Patient >  75% LTG: Ambulation distance in home environment: 25'   Problem: RH Wheelchair Mobility Goal: LTG Patient will propel w/c in home environment (PT) Description: LTG: Patient will propel wheelchair in home environment, # of feet with assistance (PT). Flowsheets (Taken 05/12/2021 1228) LTG: Pt will propel w/c in home environ  assist needed:: Independent with assistive device Distance: wheelchair distance in controlled environment: 150 LTG: Propel w/c distance in home environment: 150'

## 2021-05-12 NOTE — Evaluation (Signed)
Physical Therapy Assessment and Plan  Patient Details  Name: [name] MRN: [id number] Date of Birth: January 06, 1951  PT Diagnosis: Abnormality of gait, Cognitive deficits, Contracture of joint: R knee flexion contracture, Coordination disorder, Difficulty walking, Impaired cognition, Muscle weakness, and Pain in L BKA Rehab Potential: Good ELOS: 14-18 days   Today's Date: 05/12/2021 PT Individual Time: 0900-0955 PT Individual Time Calculation (min): 55 min    Hospital Problem: Principal Problem:   Left below-knee amputee [name])   Past Medical History:  Past Medical History:  Diagnosis Date   Ambulates with cane    straight cane   Cervical myelopathy ([place]) 02/06/2018   Chronic kidney disease    dailysis M W F- home   Complication of anesthesia    Coronary artery disease    Diabetes mellitus without complication ([place])    type2   Diabetic foot ulcer ([place])    Diabetic neuropathy ([place]) 02/06/2018   Gait abnormality 08/24/2018   GERD (gastroesophageal reflux disease)     01/06/20- not current   Gout    History of blood transfusion    History of kidney stones    passed stones   Hypercholesteremia    Hypertension    Hypothyroidism    Neuromuscular disorder ([place])    neuropathy left leg and bilateral feet   Neuropathy    PONV (postoperative nausea and vomiting)    Prostate cancer ([place])    PVD (peripheral vascular disease) ([place])    with amputations   Past Surgical History:  Past Surgical History:  Procedure Laterality Date   A-FLUTTER ABLATION N/A 04/06/2020   Procedure: A-FLUTTER ABLATION;  Surgeon: [name], MD;  Location: [facility];  Service: Cardiovascular;  Laterality: N/A;   AMPUTATION Left 12/25/2013   Procedure: AMPUTATION RAY LEFT 5TH RAY;  Surgeon: [name], MD;  Location: [facility];  Service: Orthopedics;  Laterality: Left;   AMPUTATION Right 12/15/2018   Procedure: AMPUTATION OF 4TH AND 5TH TOES RIGHT FOOT;  Surgeon: [name], MD;  Location:  [facility];  Service: Orthopedics;  Laterality: Right;   AMPUTATION Left 02/02/2021   Procedure: LEFT FOOT 4TH RAY AMPUTATION;  Surgeon: [name], MD;  Location: [place];  Service: Orthopedics;  Laterality: Left;   AMPUTATION Left 05/09/2021   Procedure: LEFT BELOW KNEE AMPUTATION;  Surgeon: [name], MD;  Location: [facility];  Service: Orthopedics;  Laterality: Left;   APPLICATION OF WOUND VAC Right 12/15/2018   Procedure: APPLICATION OF WOUND VAC;  Surgeon: [name], MD;  Location: [place];  Service: Orthopedics;  Laterality: Right;   AV FISTULA PLACEMENT Left 03/25/2019   Procedure: LEFT ARM ARTERIOVENOUS (AV) FISTULA CREATION;  Surgeon: [name], MD;  Location: [facility];  Service: Vascular;  Laterality: Left;   [place] Left 05/20/2019   Procedure: SECOND STAGE LEFT BASCILIC VEIN TRANSPOSITION;  Surgeon: [name], MD;  Location: [facility];  Service: Vascular;  Laterality: Left;   CARDIAC CATHETERIZATION  02/17/2014   CHOLECYSTECTOMY     COLONOSCOPY  2011   [place]  2008   CYSTOSCOPY N/A 06/29/2020   Procedure: [place];  Surgeon: [name], MD;  Location: [facility];  Service: Urology;  Laterality: N/A;   CYSTOSCOPY WITH FULGERATION Bilateral 06/17/2020   Procedure: CYSTOSCOPY,BILATERAL RETROGRADE, CLOT EVACUATION WITH FULGERATION OF THE BLADDER;  Surgeon: [name]  R, MD;  Location: [place] [facility];  Service: Urology;  Laterality: Bilateral;   CYSTOSCOPY WITH FULGERATION N/A 06/28/2020   Procedure: CYSTOSCOPY WITH CLOT EVACUATION AND FULGERATION OF BLEEDERS;  Surgeon: [name], MD;  Location: [facility];  Service: Urology;  Laterality: N/A;  1 HR   I & D EXTREMITY Right 12/15/2018   Procedure: DEBRIDEMENT RIGHT FOOT;  Surgeon: [name], MD;  Location: [facility];  Service: Orthopedics;  Laterality: Right;   I & D EXTREMITY Right 08/27/2019   Procedure: PARTIAL  CUBOID EXCISION RIGHT FOOT;  Surgeon: [name], MD;  Location: [facility];  Service: Orthopedics;  Laterality: Right;   I & D EXTREMITY Right 01/07/2020   Procedure: RIGHT FOOT EXCISION INFECTED BONE;  Surgeon: [name], MD;  Location: [place];  Service: Orthopedics;  Laterality: Right;   NECK SURGERY     novemver 2019   TRANSURETHRAL RESECTION OF PROSTATE N/A 06/28/2020   Procedure: TRANSURETHRAL RESECTION OF THE PROSTATE (TURP);  Surgeon: [name], MD;  Location: [place] [place];  Service: Urology;  Laterality: N/A;   WISDOM TOOTH EXTRACTION      Assessment & Plan Clinical Impression: Patient is a 71 y.o. year old male with history of end-stage renal disease with hemodialysis, diabetes mellitus, gout, prostate cancer with TURP 06/28/2020, hypertension atrial flutter with ablation 04/06/2020 per Dr. [name], CABG 2008, quit smoking 32 years ago.  Per chart review lives with spouse.  Multilevel home with one-step to entry.  Modified independent prior to admission.  Presented to 04/27/2021 with pain ulceration deformity left foot status post left fourth and fifth ray amputation 02/02/2021 per Dr. [name].  Limb was not felt to be salvageable underwent left BKA 05/09/2021 per Dr. [name].  Wound VAC applied.  Hospital course follow-up renal service for ongoing hemodialysis.  Acute on chronic anemia latest hemoglobin 9.7.  Therapy evaluations completed due to patient's decreased functional mobility was admitted for a comprehensive rehab program. More lethargic today as per PT.   Patient currently requires max with mobility secondary to muscle weakness and muscle joint tightness, decreased cardiorespiratoy endurance, impaired timing and sequencing, unbalanced muscle activation, motor apraxia, decreased coordination, and decreased motor planning, decreased attention, decreased awareness, decreased problem solving, decreased safety awareness, decreased memory, and delayed processing, and decreased sitting balance,  decreased standing balance, decreased postural control, and decreased balance strategies.  Prior to hospitalization, patient was modified independent  with mobility and lived with Spouse, Son in a House home.  Home access is  Ramped entrance (Ramp from garage, ramp inside house to [place]).  Patient will benefit from skilled PT intervention to maximize safe functional mobility, minimize fall risk, and decrease caregiver burden for planned discharge home with intermittent assist.  Anticipate patient will benefit from follow up [facility] at discharge.  PT - End of Session Activity Tolerance: Tolerates 30+ min activity with multiple rests Endurance Deficit: Yes Endurance Deficit Description: Very deconditioned, required frequent rest breaks PT Assessment Rehab Potential (ACUTE/IP ONLY): Good PT Barriers to Discharge: Decreased caregiver support;Wound Care;Weight bearing restrictions;Behavior PT Patient demonstrates impairments in the following area(s): Balance;Behavior;Endurance;Motor;Pain;Safety;Sensory;Skin Integrity PT Transfers Functional Problem(s): Bed Mobility;Bed to Chair;Car PT Locomotion Functional Problem(s): Ambulation;Wheelchair Mobility PT Plan PT Intensity: Minimum of 1-2 x/day ,45 to 90 minutes PT Frequency: 5 out of 7 days PT Duration Estimated Length of Stay: 14-18 days PT Treatment/Interventions: Ambulation/gait training;Community reintegration;DME/adaptive equipment instruction;Neuromuscular re-education;Psychosocial support;UE/LE Strength taining/ROM;Wheelchair propulsion/positioning;Balance/vestibular training;Discharge planning;Pain management;Skin care/wound management;Therapeutic Activities;UE/LE Coordination activities;Cognitive remediation/compensation;Disease management/prevention;Functional mobility training;Patient/family education;Splinting/orthotics;Therapeutic Exercise;Visual/perceptual remediation/compensation  PT Transfers Anticipated Outcome(s): CGA PT Locomotion Anticipated  Outcome(s): Min A PT Recommendation Recommendations for Other Services: Neuropsych consult;Speech consult Follow Up Recommendations: Home health PT Patient destination: Home Equipment Recommended: To be determined   PT Evaluation Precautions/Restrictions Precautions Precautions: Fall Precaution Comments: wound vac L LE Required Braces or Orthoses: Other Brace Other Brace: limb protector L LE, AFO to R LE Restrictions Weight Bearing Restrictions: Yes LLE Weight Bearing: Non weight bearing Pain Interference Pain Interference Pain Effect on Sleep: 3. Frequently Pain Interference with Therapy Activities: 3. Frequently Pain Interference with Day-to-Day Activities: 3. Frequently Home Living/Prior Functioning Home Living Available Help at Discharge: Family;Available PRN/intermittently Type of Home: House Home Access: Ramped entrance (Ramp from garage, ramp inside house to [place]) Home Layout: Multi-level;Able to live on main level with bedroom/bathroom;Full bath on main level Bathroom Shower/Tub: Multimedia programmer: Standard Bathroom Accessibility: Yes Additional Comments: wife assists with set up of home HD  Lives With: Spouse;Son Prior Function Level of Independence: Requires assistive device for independence;Needs assistance with ADLs;Needs assistance with gait  Able to Take Stairs?: No Driving: No Vocation: Part time employment 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 Vision/Perception  Vision - History Ability to See in Adequate Light: 1 Impaired Perception Perception: Impaired Inattention/Neglect: Impaired-to be further tested in functional context;Does not attend to right side of body Praxis Praxis: Impaired Praxis Impairment Details: Motor planning  Cognition Overall Cognitive Status: Impaired/Different from baseline Arousal/Alertness: Awake/alert Orientation Level: Oriented X4 Safety/Judgment: Impaired Sensation Sensation Light Touch: Impaired  Detail Peripheral sensation comments: Impaired distally RLE Light Touch Impaired Details: Impaired RLE Coordination Gross Motor Movements are Fluid and Coordinated: No Coordination and Movement Description: Impaired by pain, L BKA, R 4th and 5th ray amputation Finger Nose Finger Test: Slowed, dysmetria bilaterally w/undershooting of RUE> LUE Heel Shin Test: NT Motor  Motor Motor: Motor apraxia Motor - Skilled Clinical Observations: Affected by pain, asterixis   Trunk/Postural Assessment  Cervical Assessment Cervical Assessment: Exceptions to [facility] (Forward head, sustained flexion) Thoracic Assessment Thoracic Assessment: Exceptions to [facility] (Kyphotic, rounded shoulders) Lumbar Assessment Lumbar Assessment: Exceptions to [facility] (Posterior pelvic tilt) Postural Control Postural Control: Deficits on evaluation Head Control: Poor Trunk Control: Unable to maintain midline orientation or upright posture without BUE support Righting Reactions: Delayed Protective Responses: Delayed  Balance Balance Balance Assessed: Yes Static Sitting Balance Static Sitting - Balance Support: Bilateral upper extremity supported;Feet supported Static Sitting - Level of Assistance: 4: Min assist Static Sitting - Comment/# of Minutes: 5 Dynamic Sitting Balance Dynamic Sitting - Balance Support: During functional activity;Bilateral upper extremity supported;Feet supported Dynamic Sitting - Level of Assistance: 3: Mod assist Dynamic Sitting Balance - Compensations: Must use BUEs to maintain balance, demonstrated posterior lean Dynamic Sitting - Balance Activities: Lateral lean/weight shifting;Forward lean/weight shifting;Trunk control activities Sitting balance - Comments: reliant on UE support Extremity Assessment  RLE Assessment RLE Assessment: Exceptions to [facility] General Strength Comments: grossly 4-/5 RLE Strength RLE Overall Strength: Deficits;Due to premorbid status;Due to impaired cognition Right Hip  Flexion: 4-/5 Right Hip ABduction: 4-/5 Right Hip ADduction: 4-/5 Right Knee Flexion: 3+/5 Right Knee Extension: 4-/5 Right Ankle Dorsiflexion: 3/5 Right Ankle Plantar Flexion: 1/5 LLE Assessment LLE Assessment: Exceptions to [facility] LLE Strength LLE Overall Strength: Deficits;Due to premorbid status;Due to pain Left Hip Flexion: 4-/5 Left Hip ABduction: 3+/5 Left Hip ADduction: 3+/5  Care Tool Care Tool Bed Mobility Roll left and right activity   Roll left and right assist level: Independent with assistive device Roll left and right  assistive device comment: Bedrail  Sit to lying activity   Sit to lying assist level: Contact Guard/Touching assist    Lying to sitting on side of bed activity   Lying to sitting on side of bed assist level: the ability to move from lying on the back to sitting on the side of the bed with no back support.: Contact Guard/Touching assist     Care Tool Transfers Sit to stand transfer Sit to stand activity did not occur: Safety/medical concerns (Poor motor planning, LLE NWB, Orthotic shoe on RLE, pain)      Chair/bed transfer   Chair/bed transfer assist level: 2 Helpers Contractor)     Psychologist, counselling transfer activity did not occur: Safety/medical concerns (Poor motor planning, LLE NWB, Orthotic shoe on RLE, pain)        Care Tool Locomotion Ambulation Ambulation activity did not occur: Safety/medical concerns (Poor motor planning, LLE NWB, Orthotic shoe on RLE, pain)        Walk 10 feet activity Walk 10 feet activity did not occur: Safety/medical concerns (Poor motor planning, LLE NWB, Orthotic shoe on RLE, pain)       Walk 50 feet with 2 turns activity Walk 50 feet with 2 turns activity did not occur: Safety/medical concerns (Poor motor planning, LLE NWB, Orthotic shoe on RLE, pain)      Walk 150 feet activity Walk 150 feet activity did not occur: Safety/medical concerns (Poor motor planning, LLE NWB, Orthotic  shoe on RLE, pain)      Walk 10 feet on uneven surfaces activity Walk 10 feet on uneven surfaces activity did not occur: Safety/medical concerns (Poor motor planning, LLE NWB, Orthotic shoe on RLE, pain)      Stairs Stair activity did not occur: N/A        Walk up/down 1 step activity Walk up/down 1 step or curb (drop down) activity did not occur: N/A      Walk up/down 4 steps activity Walk up/down 4 steps activity did not occur: N/A      Walk up/down 12 steps activity Walk up/down 12 steps activity did not occur: N/A      Pick up small objects from floor Pick up small object from the floor (from standing position) activity did not occur: Safety/medical concerns (Poor motor planning, LLE NWB, Orthotic shoe on RLE, pain)      Wheelchair Is the patient using a wheelchair?: Yes Type of Wheelchair: Manual   Wheelchair assist level: Set up assist Max wheelchair distance: 10'  Wheel 50 feet with 2 turns activity   Assist Level: Total Assistance - Patient < 25%  Wheel 150 feet activity   Assist Level: Dependent - Patient 0%    Refer to Care Plan for Long Term Goals  SHORT TERM GOAL WEEK 1 PT Short Term Goal 1 (Week 1): Pt will perform sit <>stand w/LRAD and mod A PT Short Term Goal 2 (Week 1): Pt will perform bed <>chair transfer w/LRAD and mod A PT Short Term Goal 3 (Week 1): Pt will initiate gait training w/mod A and LRAD  Recommendations for other services: Neuropsych and Other: SLP  Skilled Therapeutic Intervention Evaluation completed (see details above and below) with education on PT POC, CIR safety policies, CIR 3 hour therapy requirement and goals. Pt received supine in bed, L limb guard donned. Pt reported 7/10 pain in LLE and was premedicated. Offered repositioning and distraction for pain modulation. Pt  able to roll to L and R side mod I w/bedrail and performed supine <>sit EOB w/CGA and mod verbal cues for motor planning. Pt required mod-max verbal cues throughout  session due to poor motor planning, cognitive deficits and perseveration on the fact he has no L foot and cannot stand and wanting the wound vac to be removed. At EOB, donned orthotic R shoe w/max A and pt performed lateral board transfer from EOB to [place] on L side w/2 helpers for safety. Max verbal and tactile cues for hand placement, head-hip relationship and motor planning. Pt was left seated in WC in room, nursing present, all needs in reach. Safety plan updated   Mobility Bed Mobility Bed Mobility: Rolling Right;Rolling Left;Supine to Sit;Sitting - Scoot to Edge of Bed Rolling Right: Independent with assistive device Rolling Left: Independent with assistive device Supine to Sit: Contact Guard/Touching assist Sitting - Scoot to Edge of Bed: Contact Guard/Touching assist Transfers Transfers: Lateral/Scoot Transfers Lateral/Scoot Transfers: 2 Press photographer (Assistive device): Other (Comment) (Slide board) Locomotion  Gait Ambulation: No Gait Gait: No Stairs / Additional Locomotion Stairs: No Architect: Yes Wheelchair Assistance: Set up Lexicographer: Both upper extremities Wheelchair Parts Management: Needs assistance Distance: 10'   Discharge Criteria: Patient will be discharged from PT if patient refuses treatment 3 consecutive times without medical reason, if treatment goals not met, if there is a change in medical status, if patient makes no progress towards goals or if patient is discharged from hospital.  The above assessment, treatment plan, treatment alternatives and goals were discussed and mutually agreed upon: by patient  Cruzita Lederer Gearline Spilman, PT, DPT 05/12/2021, 10:03 AM

## 2021-05-12 NOTE — Progress Notes (Signed)
Patient ID: TIGRAN HAYNIE, male   DOB: [birth year], 71 y.o.   MRN: [id number]  [facility] Progress Note   Assessment/ Plan:   Status post left below-knee amputation for nonhealing diabetic foot ulcer/osteomyelitis he is postoperative day #3 by Dr. [name].  He is currently admitted to the inpatient rehabilitation unit and is starting his scheduled occupational therapy/physical therapy today.  ESRD: Usually 4d/week at home, and will be on a TTS schedule transiently while here in the hospital with daily assessments for acute needs.  Hypertension/volume: BP borderline high likely compounded by pain, on Losartan and Coreg.  Will monitor closely with hemodialysis/ultrafiltration as EDW will likely need revision status post BKA.  Anemia: Hgb 9.7, will continue to monitor trend and decide on reducing with ESA.  Metabolic bone disease: Calcium and phosphorus levels currently at goal. Continue calcitriol for PTH control and renvela for phosphorus binding.   Nutrition:  Alb 3.3, on protein supplement.  Diabetes mellitus: on insulin, management per primary team  Subjective:   Reports breakthrough pain over surgical site of left below-knee amputation because he has been avoiding narcotics "they make my mind foggy".   Objective:   BP (!) 158/64 (BP Location: Right Arm)    Pulse 75    Temp 98 F (36.7 C)    Resp 18    Ht 5\' 11"  (1.803 m)    Wt 96.9 kg    SpO2 99%    BMI 29.79 kg/m   Physical Exam: Gen: Appears comfortable sitting up in bed, NT at bedside CVS: Pulse regular rhythm, normal rate, S1 and S2 normal Resp: Clear to auscultation bilaterally, no rales/rhonchi Abd: Soft, obese, nontender, bowel sounds normal Ext: Status post left below-knee amputation with clean dressing, left brachiocephalic fistula with palpable thrill.  Labs: BMET Recent Labs  Lab 05/09/21 0724 05/09/21 0752 05/10/21 0209  NA 134* 134* 132*  K 4.0 4.0 4.6  CL 96* 95* 95*  CO2 27  --  21*  GLUCOSE 144* 139*  136*  BUN 27* 26* 48*  CREATININE 4.14* 4.30* 5.35*  CALCIUM 8.9  --  8.4*  PHOS  --   --  4.1   CBC Recent Labs  Lab 05/09/21 0724 05/09/21 0752 05/10/21 0209 05/11/21 0158  WBC 5.0  --  8.0 6.7  NEUTROABS 3.8  --   --   --   HGB 9.6* 9.5* 9.4* 9.7*  HCT 28.3* 28.0* 28.3* 28.7*  MCV 90.7  --  91.9 91.1  PLT 100*  --  123* 107*     Medications:     allopurinol  200 mg Oral Daily   vitamin C  1,000 mg Oral Daily   calcitRIOL  0.5 mcg Oral Daily   carvedilol  12.5 mg Oral BID WC   docusate sodium  100 mg Oral Daily   insulin aspart  0-6 Units Subcutaneous TID WC   insulin aspart  2 Units Subcutaneous TID WC   levothyroxine  112 mcg Oral QAC breakfast   losartan  100 mg Oral Daily   nutrition supplement (JUVEN)  1 packet Oral BID BM   pantoprazole  40 mg Oral Daily   polyvinyl alcohol  1-2 drop Both Eyes Daily   pregabalin  150 mg Oral Daily   pregabalin  300 mg Oral QHS   rosuvastatin  10 mg Oral Daily   Vitamin D (Ergocalciferol)  50,000 Units Oral Q7 days   zinc sulfate  220 mg Oral Daily   Elmarie Shiley, MD  05/12/2021, 10:01 AM

## 2021-05-12 NOTE — Progress Notes (Signed)
PROGRESS NOTE   Subjective/Complaints: No complaints On commode. Not able to have BM. Increased colace to BID Working with therapy Hgb uptrending  ROS: +constipation   Objective:   No results found. Recent Labs    05/10/21 0209 05/11/21 0158  WBC 8.0 6.7  HGB 9.4* 9.7*  HCT 28.3* 28.7*  PLT 123* 107*   Recent Labs    05/10/21 0209  NA 132*  K 4.6  CL 95*  CO2 21*  GLUCOSE 136*  BUN 48*  CREATININE 5.35*  CALCIUM 8.4*    Intake/Output Summary (Last 24 hours) at 05/12/2021 1809 Last data filed at 05/12/2021 1300 Gross per 24 hour  Intake 460 ml  Output 100 ml  Net 360 ml        Physical Exam: Vital Signs Blood pressure (!) 141/77, pulse 75, temperature 97.8 F (36.6 C), temperature source Oral, resp. rate 18, height 5\' 11"  (1.803 m), weight 96.9 kg, SpO2 99 %. Gen: no distress, normal appearing HEENT: oral mucosa pink and moist, NCAT Cardio: Reg rate Chest: normal effort, normal rate of breathing Abd: soft, non-distended Ext: no edema Psych: pleasant, normal affect Skin:    Comments: Left BKA dressed appropriately tender with wound VAC in place.  Neurological:     Comments: Patient is alert.  Makes eye contact with examiner.  Oriented x3   Assessment/Plan: 1. Functional deficits which require 3+ hours per day of interdisciplinary therapy in a comprehensive inpatient rehab setting. Physiatrist is providing close team supervision and 24 hour management of active medical problems listed below. Physiatrist and rehab team continue to assess barriers to discharge/monitor patient progress toward functional and medical goals  Care Tool:  Bathing    Body parts bathed by patient: Left arm, Right arm, Chest, Abdomen, Front perineal area, Face, Right lower leg, Right upper leg   Body parts bathed by helper: Buttocks Body parts n/a: Left upper leg, Left lower leg   Bathing assist Assist Level: Moderate  Assistance - Patient 50 - 74%     Upper Body Dressing/Undressing Upper body dressing   What is the patient wearing?: Pull over shirt    Upper body assist Assist Level: Moderate Assistance - Patient 50 - 74%    Lower Body Dressing/Undressing Lower body dressing      What is the patient wearing?: Underwear/pull up, Pants     Lower body assist Assist for lower body dressing: Maximal Assistance - Patient 25 - 49%     Toileting Toileting Toileting Activity did not occur (Clothing management and hygiene only): N/A (no void or bm)  Toileting assist       Transfers Chair/bed transfer  Transfers assist     Chair/bed transfer assist level: 2 Helpers Contractor)     Locomotion Ambulation   Ambulation assist   Ambulation activity did not occur: Safety/medical concerns (Poor motor planning, LLE NWB, Orthotic shoe on RLE, pain)          Walk 10 feet activity   Assist  Walk 10 feet activity did not occur: Safety/medical concerns (Poor motor planning, LLE NWB, Orthotic shoe on RLE, pain)        Walk 50 feet activity  Assist Walk 50 feet with 2 turns activity did not occur: Safety/medical concerns (Poor motor planning, LLE NWB, Orthotic shoe on RLE, pain)         Walk 150 feet activity   Assist Walk 150 feet activity did not occur: Safety/medical concerns (Poor motor planning, LLE NWB, Orthotic shoe on RLE, pain)         Walk 10 feet on uneven surface  activity   Assist Walk 10 feet on uneven surfaces activity did not occur: Safety/medical concerns (Poor motor planning, LLE NWB, Orthotic shoe on RLE, pain)         Wheelchair     Assist Is the patient using a wheelchair?: Yes Type of Wheelchair: Manual    Wheelchair assist level: Set up assist, Supervision/Verbal cueing Max wheelchair distance: >150'    Wheelchair 50 feet with 2 turns activity    Assist        Assist Level: Supervision/Verbal cueing   Wheelchair 150 feet  activity     Assist      Assist Level: Supervision/Verbal cueing   Blood pressure (!) 141/77, pulse 75, temperature 97.8 F (36.6 C), temperature source Oral, resp. rate 18, height 5\' 11"  (1.803 m), weight 96.9 kg, SpO2 99 %. Medical Problem List and Plan: 1. Functional deficits secondary to left BKA 05/09/2021 secondary to peripheral vascular disease poor healing of recent left fourth and fifth ray amputation             -patient may shower but incision must be covered             -ELOS/Goals: 8-12 days S             Admit to CIR 2.  Antithrombotics: -DVT/anticoagulation:  Mechanical: Antiembolism stockings, thigh (TED hose) Right lower extremity             -antiplatelet therapy: N/A 3. Postoperative pain: hold opioid medications due to sedation. Lyrica 150 mg daily and 300 mg nightly, oxycodone as needed 4. Mood: Provide emotional support             -antipsychotic agents: N/A 5. Neuropsych: This patient is capable of making decisions on his own behalf. 6. Skin/Wound Care: Routine skin checks 7. Hyponatremia: monitor Na with HD labs.  8.  End-stage renal disease.  Continue hemodialysis as per renal services. 9.  Acute on chronic anemia.  Hgb improving, monitor w/ HD labs.  10.  Hypertension.  Cozaar 100 mg daily, Coreg 12.5 mg twice daily.  Monitor with increased mobility 11.  Diabetes mellitus with peripheral neuropathy.  Hemoglobin A1c 5.5.  NovoLog 2 units 3 times daily 12.  Hypothyroidism.  Synthroid 13.  Hyperlipidemia.  Crestor 14.  History of gout.  Continue allopurinol.  Monitor for any gout flareups 15.  History of atrial flutter with ablation 04/06/2020.  Follow-up cardiology services as needed.  Cardiac rate controlled 16.  History of prostate cancer status post TURP 06/28/2020.  Follow-up outpatient 17.  Obesity.  BMI 29.99.  Dietary follow-up 18.  GERD.  Protonix 19. Sedation: hold opioid medications 20. Vitamin D deficiency: start ergocalciferol 50,000U once per  week for 7 weeks. 21. Constipation: increase colace to 100mg  BID  LOS: 1 days A FACE TO FACE EVALUATION WAS PERFORMED  Martha Clan P Vernadine Coombs 05/12/2021, 6:09 PM

## 2021-05-12 NOTE — Plan of Care (Signed)
°  Problem: RH Balance Goal: LTG: Patient will maintain dynamic sitting balance (OT) Description: LTG:  Patient will maintain dynamic sitting balance with assistance during activities of daily living (OT) Flowsheets (Taken 05/12/2021 1441) LTG: Pt will maintain dynamic sitting balance during ADLs with: Supervision/Verbal cueing   Problem: Sit to Stand Goal: LTG:  Patient will perform sit to stand in prep for activites of daily living with assistance level (OT) Description: LTG:  Patient will perform sit to stand in prep for activites of daily living with assistance level (OT) Flowsheets (Taken 05/12/2021 1441) LTG: PT will perform sit to stand in prep for activites of daily living with assistance level: Supervision/Verbal cueing   Problem: RH Grooming Goal: LTG Patient will perform grooming w/assist,cues/equip (OT) Description: LTG: Patient will perform grooming with assist, with/without cues using equipment (OT) Flowsheets (Taken 05/12/2021 1441) LTG: Pt will perform grooming with assistance level of: Set up assist    Problem: RH Bathing Goal: LTG Patient will bathe all body parts with assist levels (OT) Description: LTG: Patient will bathe all body parts with assist levels (OT) Flowsheets (Taken 05/12/2021 1441) LTG: Pt will perform bathing with assistance level/cueing: Supervision/Verbal cueing   Problem: RH Dressing Goal: LTG Patient will perform upper body dressing (OT) Description: LTG Patient will perform upper body dressing with assist, with/without cues (OT). Flowsheets (Taken 05/12/2021 1441) LTG: Pt will perform upper body dressing with assistance level of: Supervision/Verbal cueing Goal: LTG Patient will perform lower body dressing w/assist (OT) Description: LTG: Patient will perform lower body dressing with assist, with/without cues in positioning using equipment (OT) Flowsheets (Taken 05/12/2021 1441) LTG: Pt will perform lower body dressing with assistance level of: Minimal  Assistance - Patient > 75%   Problem: RH Toileting Goal: LTG Patient will perform toileting task (3/3 steps) with assistance level (OT) Description: LTG: Patient will perform toileting task (3/3 steps) with assistance level (OT)  Flowsheets (Taken 05/12/2021 1441) LTG: Pt will perform toileting task (3/3 steps) with assistance level: Supervision/Verbal cueing   Problem: RH Toilet Transfers Goal: LTG Patient will perform toilet transfers w/assist (OT) Description: LTG: Patient will perform toilet transfers with assist, with/without cues using equipment (OT) Flowsheets (Taken 05/12/2021 1441) LTG: Pt will perform toilet transfers with assistance level of: Contact Guard/Touching assist   Problem: RH Tub/Shower Transfers Goal: LTG Patient will perform tub/shower transfers w/assist (OT) Description: LTG: Patient will perform tub/shower transfers with assist, with/without cues using equipment (OT) Flowsheets (Taken 05/12/2021 1441) LTG: Pt will perform tub/shower stall transfers with assistance level of: Contact Guard/Touching assist

## 2021-05-12 NOTE — Progress Notes (Signed)
Pt refused to do HD. Per Nash Dimmer RN reported. Stephania Fragmin PA notified. No order received.

## 2021-05-13 DIAGNOSIS — Z89512 Acquired absence of left leg below knee: Secondary | ICD-10-CM | POA: Diagnosis not present

## 2021-05-13 LAB — GLUCOSE, CAPILLARY
Glucose-Capillary: 128 mg/dL — ABNORMAL HIGH (ref 70–99)
Glucose-Capillary: 147 mg/dL — ABNORMAL HIGH (ref 70–99)
Glucose-Capillary: 161 mg/dL — ABNORMAL HIGH (ref 70–99)
Glucose-Capillary: 165 mg/dL — ABNORMAL HIGH (ref 70–99)

## 2021-05-13 MED ORDER — ACETAMINOPHEN 500 MG PO TABS
1000.0000 mg | ORAL_TABLET | Freq: Three times a day (TID) | ORAL | Status: DC
  Administered 2021-05-13 – 2021-05-24 (×31): 1000 mg via ORAL
  Filled 2021-05-13 (×32): qty 2

## 2021-05-13 MED ORDER — SENNA 8.6 MG PO TABS
2.0000 | ORAL_TABLET | Freq: Every day | ORAL | Status: DC
  Administered 2021-05-13 – 2021-05-23 (×11): 17.2 mg via ORAL
  Filled 2021-05-13 (×11): qty 2

## 2021-05-13 MED ORDER — DOCUSATE SODIUM 100 MG PO CAPS
100.0000 mg | ORAL_CAPSULE | Freq: Three times a day (TID) | ORAL | Status: DC
  Administered 2021-05-13 – 2021-05-24 (×30): 100 mg via ORAL
  Filled 2021-05-13 (×31): qty 1

## 2021-05-13 NOTE — Progress Notes (Signed)
PROGRESS NOTE   Subjective/Complaints: Bright and alert Had BM but still straining, agreeable to increasing bowel medication Having residual limb pain but opioids make him confused. Discussed scheduling tylenol and he is agreeable  ROS: +constipation, +residual limb pain   Objective:   No results found. Recent Labs    05/11/21 0158  WBC 6.7  HGB 9.7*  HCT 28.7*  PLT 107*   No results for input(s): NA, K, CL, CO2, GLUCOSE, BUN, CREATININE, CALCIUM in the last 72 hours.   Intake/Output Summary (Last 24 hours) at 05/13/2021 1045 Last data filed at 05/13/2021 0825 Gross per 24 hour  Intake 478 ml  Output 200 ml  Net 278 ml        Physical Exam: Vital Signs Blood pressure (!) 142/59, pulse 61, temperature 98.2 F (36.8 C), resp. rate 18, height 5\' 11"  (1.803 m), weight 96.9 kg, SpO2 94 %. Gen: no distress, normal appearing, BMI 29.79 HEENT: oral mucosa pink and moist, NCAT Cardio: Reg rate Chest: normal effort, normal rate of breathing Abd: soft, non-distended Ext: no edema Psych: pleasant, normal affect Skin:    Comments: Left BKA dressed appropriately tender with wound VAC in place.  Neurological:     Comments: Patient is alert.  Makes eye contact with examiner.  Oriented x3   Assessment/Plan: 1. Functional deficits which require 3+ hours per day of interdisciplinary therapy in a comprehensive inpatient rehab setting. Physiatrist is providing close team supervision and 24 hour management of active medical problems listed below. Physiatrist and rehab team continue to assess barriers to discharge/monitor patient progress toward functional and medical goals  Care Tool:  Bathing    Body parts bathed by patient: Left arm, Right arm, Chest, Abdomen, Front perineal area, Face, Right lower leg, Right upper leg   Body parts bathed by helper: Buttocks Body parts n/a: Left upper leg, Left lower leg   Bathing  assist Assist Level: Moderate Assistance - Patient 50 - 74%     Upper Body Dressing/Undressing Upper body dressing   What is the patient wearing?: Pull over shirt    Upper body assist Assist Level: Moderate Assistance - Patient 50 - 74%    Lower Body Dressing/Undressing Lower body dressing      What is the patient wearing?: Pants, Incontinence brief     Lower body assist Assist for lower body dressing: Maximal Assistance - Patient 25 - 49%     Toileting Toileting Toileting Activity did not occur (Clothing management and hygiene only): N/A (no void or bm)  Toileting assist Assist for toileting: Moderate Assistance - Patient 50 - 74%     Transfers Chair/bed transfer  Transfers assist     Chair/bed transfer assist level: 2 Helpers     Locomotion Ambulation   Ambulation assist   Ambulation activity did not occur: Safety/medical concerns (Poor motor planning, LLE NWB, Orthotic shoe on RLE, pain)          Walk 10 feet activity   Assist  Walk 10 feet activity did not occur: Safety/medical concerns (Poor motor planning, LLE NWB, Orthotic shoe on RLE, pain)        Walk 50 feet activity   Assist  Walk 50 feet with 2 turns activity did not occur: Safety/medical concerns (Poor motor planning, LLE NWB, Orthotic shoe on RLE, pain)         Walk 150 feet activity   Assist Walk 150 feet activity did not occur: Safety/medical concerns (Poor motor planning, LLE NWB, Orthotic shoe on RLE, pain)         Walk 10 feet on uneven surface  activity   Assist Walk 10 feet on uneven surfaces activity did not occur: Safety/medical concerns (Poor motor planning, LLE NWB, Orthotic shoe on RLE, pain)         Wheelchair     Assist Is the patient using a wheelchair?: Yes Type of Wheelchair: Manual    Wheelchair assist level: Set up assist, Supervision/Verbal cueing Max wheelchair distance: >150'    Wheelchair 50 feet with 2 turns activity    Assist         Assist Level: Supervision/Verbal cueing   Wheelchair 150 feet activity     Assist      Assist Level: Supervision/Verbal cueing   Blood pressure (!) 142/59, pulse 61, temperature 98.2 F (36.8 C), resp. rate 18, height 5\' 11"  (1.803 m), weight 96.9 kg, SpO2 94 %. Medical Problem List and Plan: 1. Functional deficits secondary to left BKA 05/09/2021 secondary to peripheral vascular disease poor healing of recent left fourth and fifth ray amputation             -patient may shower but incision must be covered             -ELOS/Goals: 8-12 days S           Continue CIR 2.  Antithrombotics: -DVT/anticoagulation:  Mechanical: Antiembolism stockings, thigh (TED hose) Right lower extremity             -antiplatelet therapy: N/A 3. Postoperative pain: d/c opioid medications due to sedation. Lyrica 150 mg daily and 300 mg nightly, oxycodone as needed. Schedule Tylenol 1000mg  TID, LFTs reviewed and stable.  4. Mood: Provide emotional support             -antipsychotic agents: N/A 5. Neuropsych: This patient is capable of making decisions on his own behalf. 6. Skin/Wound Care: Routine skin checks 7. Hyponatremia: monitor Na with HD labs.  8.  End-stage renal disease.  Continue hemodialysis as per renal services. 9.  Acute on chronic anemia.  Hgb improving, monitor w/ HD labs.  10.  Hypertension.  Cozaar 100 mg daily, Coreg 12.5 mg twice daily.  Monitor with increased mobility 11.  Diabetes mellitus with peripheral neuropathy.  Hemoglobin A1c 5.5.  NovoLog 2 units 3 times daily. Discussed outpatient [facility] and he is very interested, will schedule.  12.  Hypothyroidism.  Synthroid 13.  Hyperlipidemia.  Crestor 14.  History of gout.  Continue allopurinol.  Monitor for any gout flareups 15.  History of atrial flutter with ablation 04/06/2020.  Follow-up cardiology services as needed.  Cardiac rate controlled 16.  History of prostate cancer status post TURP 06/28/2020.  Follow-up  outpatient 17.  Obesity.  BMI 29.99.  Dietary follow-up 18.  GERD.  Protonix 19. Sedation: hold opioid medications 20. Vitamin D deficiency: start ergocalciferol 50,000U once per week for 7 weeks. 21. Constipation: increase colace to 100mg  TID and add Senna 2 tabs HS  LOS: 2 days A FACE TO FACE EVALUATION WAS PERFORMED  Cherrelle Plante P Jolicia Delira 05/13/2021, 10:45 AM

## 2021-05-13 NOTE — Progress Notes (Signed)
Patient ID: [name], male   DOB: 14-Mar-1951, 71 y.o.   MRN: [id number] Declined hemodialysis yesterday because he was feeling tired/had adequate dialysis earlier in the week.  Problems with constipation noted yesterday.  Will order for hemodialysis again tomorrow.  Elmarie Shiley MD [facility]. Office # [phone] Pager # [phone] 9:11 AM

## 2021-05-13 NOTE — Discharge Instructions (Addendum)
Inpatient Rehab Discharge Instructions  RAYMIE GIAMMARCO Discharge date and time: No discharge date for patient encounter.   Activities/Precautions/ Functional Status: Activity: As tolerated Diet: Renal diet Wound Care: Cleanse incision site daily warm soap/Dial daily Functional status:  ___ No restrictions     ___ Walk up steps independently ___ 24/7 supervision/assistance   ___ Walk up steps with assistance ___ Intermittent supervision/assistance  ___ Bathe/dress independently ___ Walk with walker     _x__ Bathe/dress with assistance ___ Walk Independently    ___ Shower independently ___ Walk with assistance    ___ Shower with assistance ___ No alcohol     ___ Return to work/school ________  COMMUNITY REFERRALS UPON DISCHARGE:    Home Health:   PT     RN                   Agency: [facility] Phone: [phone]    Medical Equipment/Items Ordered: Slide Board, Drop Arm Commode, Transfer Bench                                                 Agency/Supplier: [employer]   Special Instructions: No driving smoking or alcohol  Continue hemodialysis as directed   My questions have been answered and I understand these instructions. I will adhere to these goals and the provided educational materials after my discharge from the hospital.  Patient/Caregiver Signature _______________________________ Date __________  Clinician Signature _______________________________________ Date __________  Please bring this form and your medication list with you to all your follow-up doctor's appointments.

## 2021-05-14 DIAGNOSIS — Z89512 Acquired absence of left leg below knee: Secondary | ICD-10-CM | POA: Diagnosis not present

## 2021-05-14 LAB — CBC WITH DIFFERENTIAL/PLATELET
Abs Immature Granulocytes: 0.04 10*3/uL (ref 0.00–0.07)
Basophils Absolute: 0 10*3/uL (ref 0.0–0.1)
Basophils Relative: 1 %
Eosinophils Absolute: 0.2 10*3/uL (ref 0.0–0.5)
Eosinophils Relative: 4 %
HCT: 25.6 % — ABNORMAL LOW (ref 39.0–52.0)
Hemoglobin: 8.7 g/dL — ABNORMAL LOW (ref 13.0–17.0)
Immature Granulocytes: 1 %
Lymphocytes Relative: 8 %
Lymphs Abs: 0.4 10*3/uL — ABNORMAL LOW (ref 0.7–4.0)
MCH: 30.6 pg (ref 26.0–34.0)
MCHC: 34 g/dL (ref 30.0–36.0)
MCV: 90.1 fL (ref 80.0–100.0)
Monocytes Absolute: 0.5 10*3/uL (ref 0.1–1.0)
Monocytes Relative: 11 %
Neutro Abs: 3.5 10*3/uL (ref 1.7–7.7)
Neutrophils Relative %: 75 %
Platelets: 95 10*3/uL — ABNORMAL LOW (ref 150–400)
RBC: 2.84 MIL/uL — ABNORMAL LOW (ref 4.22–5.81)
RDW: 16 % — ABNORMAL HIGH (ref 11.5–15.5)
WBC: 4.7 10*3/uL (ref 4.0–10.5)
nRBC: 0 % (ref 0.0–0.2)

## 2021-05-14 LAB — COMPREHENSIVE METABOLIC PANEL
ALT: 11 U/L (ref 0–44)
AST: 31 U/L (ref 15–41)
Albumin: 2.9 g/dL — ABNORMAL LOW (ref 3.5–5.0)
Alkaline Phosphatase: 79 U/L (ref 38–126)
Anion gap: 16 — ABNORMAL HIGH (ref 5–15)
BUN: 89 mg/dL — ABNORMAL HIGH (ref 8–23)
CO2: 24 mmol/L (ref 22–32)
Calcium: 8.3 mg/dL — ABNORMAL LOW (ref 8.9–10.3)
Chloride: 96 mmol/L — ABNORMAL LOW (ref 98–111)
Creatinine, Ser: 10.6 mg/dL — ABNORMAL HIGH (ref 0.61–1.24)
GFR, Estimated: 5 mL/min — ABNORMAL LOW (ref >60)
Glucose, Bld: 149 mg/dL — ABNORMAL HIGH (ref 70–99)
Potassium: 4.3 mmol/L (ref 3.5–5.1)
Sodium: 136 mmol/L (ref 135–145)
Total Bilirubin: 0.6 mg/dL (ref 0.3–1.2)
Total Protein: 5.5 g/dL — ABNORMAL LOW (ref 6.5–8.1)

## 2021-05-14 LAB — GLUCOSE, CAPILLARY
Glucose-Capillary: 121 mg/dL — ABNORMAL HIGH (ref 70–99)
Glucose-Capillary: 126 mg/dL — ABNORMAL HIGH (ref 70–99)
Glucose-Capillary: 137 mg/dL — ABNORMAL HIGH (ref 70–99)
Glucose-Capillary: 145 mg/dL — ABNORMAL HIGH (ref 70–99)

## 2021-05-14 LAB — MAGNESIUM: Magnesium: 2.4 mg/dL (ref 1.7–2.4)

## 2021-05-14 MED ORDER — DARBEPOETIN ALFA 100 MCG/0.5ML IJ SOSY
100.0000 ug | PREFILLED_SYRINGE | INTRAMUSCULAR | Status: DC
  Administered 2021-05-17: 100 ug via INTRAVENOUS
  Filled 2021-05-14 (×3): qty 0.5

## 2021-05-14 NOTE — Plan of Care (Signed)
°  Problem: RH BOWEL ELIMINATION Goal: RH STG MANAGE BOWEL WITH ASSISTANCE Description: STG Manage Bowel with Min Assistance. Outcome: Not Progressing; LBM 2/5; requesting laxative after HD today

## 2021-05-14 NOTE — Care Management (Signed)
[facility] Individual Statement of Services  Patient Name:  [name]  Date:  05/14/2021  Welcome to the [facility].  Our goal is to provide you with an individualized program based on your diagnosis and situation, designed to meet your specific needs.  With this comprehensive rehabilitation program, you will be expected to participate in at least 3 hours of rehabilitation therapies Monday-Friday, with modified therapy programming on the weekends.  Your rehabilitation program will include the following services:  Physical Therapy (PT), Occupational Therapy (OT), 24 hour per day rehabilitation nursing, Therapeutic Recreaction (TR), Psychology, Neuropsychology, Care Coordinator, Rehabilitation Medicine, [place], and Other  Weekly team conferences will be held on Wednesdays to discuss your progress.  Your Inpatient Rehabilitation Care Coordinator will talk with you frequently to get your input and to update you on team discussions.  Team conferences with you and your family in attendance may also be held.  Expected length of stay: 14-18 days    Overall anticipated outcome: Contact Guard  Depending on your progress and recovery, your program may change. Your Inpatient Rehabilitation Care Coordinator will coordinate services and will keep you informed of any changes. Your Inpatient Rehabilitation Care Coordinator's name and contact numbers are listed  below.  The following services may also be recommended but are not provided by the [facility] will be made to provide these services after discharge if needed.  Arrangements include referral to agencies that provide these services.  Your insurance has been verified to be:  Medicare A/B  Your primary doctor is:  Caryl Ada  Pertinent information will be shared with your doctor and your insurance company.  Inpatient Rehabilitation Care Coordinator:  Erlene Quan, [place] or [phone]  Information discussed with and copy given to patient by: Rana Snare, 05/14/2021, 9:02 AM

## 2021-05-14 NOTE — Progress Notes (Signed)
Physical Therapy Session Note  Patient Details  Name: [name] MRN: [id number] Date of Birth: 06-08-1950  Today's Date: 05/14/2021 PT Individual Time: 1000-1039 and 1330-1410 PT Individual Time Calculation (min): 39 min and 40 min  Short Term Goals: Week 1:  PT Short Term Goal 1 (Week 1): Pt will perform sit <>stand w/LRAD and mod A PT Short Term Goal 2 (Week 1): Pt will perform bed <>chair transfer w/LRAD and mod A PT Short Term Goal 3 (Week 1): Pt will initiate gait training w/mod A and LRAD  Skilled Therapeutic Interventions/Progress Updates:   Treatment Session 1 Received pt sitting in WC, pt agreeable to PT treatment, and reported mild pain in L residual limb but did not state pain level. Session with emphasis on functional mobility/transfers, generalized strengthening and endurance, and standing balance. Pt performed WC mobility 173ft using BUE and supervision and transported remainder of way to dayroom in [facility] total A. Pt performed slideboard transfer to/from mat with CGA and worked on sit<>stands with RW and min A x 6 reps from elevated mat. Noted current RW too short - provided pt with new one. While standing attempted small "hop" in place on RLE to determine if pt could clear RLE from ground with mod A +2 for safety. However, pt slightly impulsive doing high amplitude push up and holding both LEs in air and swinging in place. Immediately instructed pt to return to sitting due to safety concerns with pt's current technique and educated pt on proper technique - will need further education. Concluded session with pt sitting in WC, needs within reach, and seatbelt alarm on. NT and nursing student present at bedside.   Of note, pt fixated on being able to use his personal WC (in room), however pt's WC does not have removable armrests to allow for SB transfers and pt currently unsafe to perform stand<>pivots. Attempted to educate pt on why it is unsafe to use his WC at this point, however pt  with decreased insight just c/o that the current wheelchair "sucks".  Treatment Session 2 Received pt sitting in WC, pt agreeable to PT treatment, and reported pain in L residual limb but did not state pain level (premedicated) - notified RN at end of session of request for additional pain medication. Session with emphasis on functional mobility, generalized strengthening and endurance, and residual limb care. Pt transported to/from room in [facility] dependently for time management purposes. Pt performed RLE strengthening on Kinetron at 20 cm/sec for 1 minute increasing to 10cm/sec for additional 1 minute x 3 trials with therapist providing manual counter resistance with emphasis on quad/glute strength. Educated pt on wear schedule for limb guard and informed pt he is allowed to take it off to sleep at night. Demonstrated technique for donning/doffing limb guard and pt able to do so with supervision and occasional cues. Pt requested to return to bed for dialysis and transferred WC<>bed via slideboard with CGA and total A to place board. Sit<>supine with supervision. Concluded session with pt semi-reclined in bed, needs within reach, and bed alarm on.   Therapy Documentation Precautions:  Precautions Precautions: Fall Precaution Comments: wound vac L LE Required Braces or Orthoses: Other Brace Other Brace: limb protector L LE, AFO with shoe component to R LE Restrictions Weight Bearing Restrictions: Yes LLE Weight Bearing: Non weight bearing  Therapy/Group: Individual Therapy Alfonse Alpers PT, DPT   05/14/2021, 7:20 AM

## 2021-05-14 NOTE — IPOC Note (Signed)
Overall Plan of Care [facility]) Patient Details Name: ELVER STADLER MRN: [id number] DOB: February 08, 1951  Admitting Diagnosis: Left below-knee amputee [facility])  Hospital Problems: Principal Problem:   Left below-knee amputee [facility])     Functional Problem List: Nursing Bowel, Edema, Endurance, Medication Management, Safety, Skin Integrity  PT Balance, Behavior, Endurance, Motor, Pain, Safety, Sensory, Skin Integrity  OT Balance, Skin Integrity, Vision, Cognition, Endurance, Motor, Pain, Safety  SLP    TR         Basic ADLs: OT Grooming, Bathing, Dressing, Toileting     Advanced  ADLs: OT Simple Meal Preparation     Transfers: PT Bed Mobility, Bed to Chair, Teacher, early years/pre, Tub/Shower     Locomotion: PT Ambulation, Wheelchair Mobility     Additional Impairments: OT None  SLP        TR      Anticipated Outcomes Item Anticipated Outcome  Self Feeding No goal  Swallowing      Basic self-care  Supervision  Toileting  Supervision   Bathroom Transfers CGA  Bowel/Bladder  min assist  Transfers  CGA  Locomotion  Min A  Communication     Cognition     Pain  n/a  Safety/Judgment  min assist and no falls   Therapy Plan: PT Intensity: Minimum of 1-2 x/day ,45 to 90 minutes PT Frequency: 5 out of 7 days PT Duration Estimated Length of Stay: 14-18 days OT Intensity: Minimum of 1-2 x/day, 45 to 90 minutes OT Frequency: 5 out of 7 days OT Duration/Estimated Length of Stay: 14-18 days     Due to the current state of emergency, patients may not be receiving their 3-hours of Medicare-mandated therapy.   Team Interventions: Nursing Interventions Patient/Family Education, Bowel Management, Disease Management/Prevention, Medication Management, Skin Care/Wound Management, Discharge Planning  PT interventions Ambulation/gait training, Community reintegration, DME/adaptive equipment instruction, Neuromuscular re-education, Psychosocial support, UE/LE Strength taining/ROM,  Wheelchair propulsion/positioning, Training and development officer, Discharge planning, Pain management, Skin care/wound management, Therapeutic Activities, UE/LE Coordination activities, Cognitive remediation/compensation, Disease management/prevention, Functional mobility training, Patient/family education, Splinting/orthotics, Therapeutic Exercise, Visual/perceptual remediation/compensation  OT Interventions Balance/vestibular training, Disease mangement/prevention, Self Care/advanced ADL retraining, Therapeutic Exercise, Wheelchair propulsion/positioning, UE/LE Strength taining/ROM, Skin care/wound managment, Pain management, DME/adaptive equipment instruction, Cognitive remediation/compensation, Community reintegration, Barrister's clerk education, Splinting/orthotics, UE/LE Coordination activities, Therapeutic Activities, Psychosocial support, Functional mobility training, Discharge planning, Visual/perceptual remediation/compensation  SLP Interventions    TR Interventions    SW/CM Interventions Discharge Planning, Psychosocial Support, Patient/Family Education   Barriers to Discharge MD  Medical stability, Wound care, Lack of/limited family support, Weight, Hemodialysis, and Weight bearing restrictions  Nursing Decreased caregiver support, Home environment access/layout, Incontinence, Wound Care, Lack of/limited family support, Weight, Hemodialysis, Weight bearing restrictions, Medication compliance Lives in multi-level home/ramped entrance. Wife and family can provide support at discharge.  PT Decreased caregiver support, Wound Care, Weight bearing restrictions, Behavior    OT Wound Care    SLP      SW       Team Discharge Planning: Destination: PT-Home ,OT- Home , SLP-  Projected Follow-up: PT-Home health PT, OT-  Home health OT, SLP-  Projected Equipment Needs: PT-To be determined, OT- To be determined, SLP-  Equipment Details: PT- , OT-  Patient/family involved in discharge planning: PT-  Patient,  OT-Patient, SLP-   MD ELOS: 14-18 days Medical Rehab Prognosis:  Good Assessment: Pt is a 71 yr old male with L BKA Due to PVD; post op pain/nerve pain/phantom pain; DM; ESRD; acute on chronic anemia;  and HTN Goals CGA- supervision   See Team Conference Notes for weekly updates to the plan of care

## 2021-05-14 NOTE — Progress Notes (Signed)
Occupational Therapy Session Note  Patient Details  Name: [name] MRN: [id number] Date of Birth: May 12, 1950  Today's Date: 05/14/2021 OT Individual Time: 1105-1200 OT Individual Time Calculation (min): 55 min    Short Term Goals: Week 1:  OT Short Term Goal 1 (Week 1): Pt will complete 1/3 components of toileting with CGA OT Short Term Goal 2 (Week 1): Pt will complete LB dressing at sit<stand level with 1 assist OT Short Term Goal 3 (Week 1): Pt will complete UB self care EOB with no more than supervision assist for sitting balance  Skilled Therapeutic Interventions/Progress Updates:  Skilled OT intervention completed with focus on amputation/self-inspection education, mass practice sit <> stands. Pt received seated in w/c, agreeable to session. Extensive education provided about preventing contractures and further infection, skin/wound care and inspection with therapist issuing pt an education booklet and self-inspection mirror. Pt appeared to be cognitively intact this session with oriented x4 and able to discuss with therapist about his bathroom set up with therapist providing education on pt's rehab/functional goals, and discussed DME available however pt very resistant to [facility]. Pt declined the need to complete self-care, and requested use of practice standing. Pt able to scoot forward in chair with supervision, then participated in mass practice x5 stands using RW, initially with fading assist from mod to min A. Education provided to pt about technique of standing with pushing up with one hand from w/c however pt declining practice with this method, as well as declining reaching back towards chair upon descent. Discussed with pt how releasing one hand will be helpful in the future with posterior pericare if not receptive to lateral leans. Utilized Scientist, research (life sciences) for midline orientation and to promote functional thoracic posture, with pt able to maintain static balance with CGA. Pt required rest  breaks due to fatigue. NT in room to take pt's CBG. Pt left seated in w/c, with leg rests donned, with NT in room at direct care handoff.   Therapy Documentation Precautions:  Precautions Precautions: Fall Precaution Comments: wound vac L LE Required Braces or Orthoses: Other Brace Other Brace: limb protector L LE, AFO with shoe component to R LE Restrictions Weight Bearing Restrictions: Yes LLE Weight Bearing: Non weight bearing  Pain: No c/o pain   Therapy/Group: Individual Therapy  Berley Gambrell E Bonita Brindisi 05/14/2021, 7:46 AM

## 2021-05-14 NOTE — Progress Notes (Signed)
Inpatient Rehabilitation Care Coordinator Assessment and Plan Patient Details  Name: [name] MRN: [id number] Date of Birth: Nov 19, 1950  Today's Date: 05/14/2021  Hospital Problems: Principal Problem:   Left below-knee amputee [facility])  Past Medical History:  Past Medical History:  Diagnosis Date   Ambulates with cane    straight cane   Cervical myelopathy ([place]) 02/06/2018   Chronic kidney disease    dailysis M W F- home   Complication of anesthesia    Coronary artery disease    Diabetes mellitus without complication ([place])    type2   Diabetic foot ulcer ([place])    Diabetic neuropathy ([place]) 02/06/2018   Gait abnormality 08/24/2018   GERD (gastroesophageal reflux disease)     01/06/20- not current   Gout    History of blood transfusion    History of kidney stones    passed stones   Hypercholesteremia    Hypertension    Hypothyroidism    Neuromuscular disorder (H[facility])    neuropathy left leg and bilateral feet   Neuropathy    PONV (postoperative nausea and vomiting)    Prostate cancer ([place])    PVD (peripheral vascular disease) ([place])    with amputations   Past Surgical History:  Past Surgical History:  Procedure Laterality Date   A-FLUTTER ABLATION N/A 04/06/2020   Procedure: A-FLUTTER ABLATION;  Surgeon: [name], MD;  Location: [place] [facility];  Service: Cardiovascular;  Laterality: N/A;   AMPUTATION Left 12/25/2013   Procedure: AMPUTATION RAY LEFT 5TH RAY;  Surgeon: [name], MD;  Location: [facility];  Service: Orthopedics;  Laterality: Left;   AMPUTATION Right 12/15/2018   Procedure: AMPUTATION OF 4TH AND 5TH TOES RIGHT FOOT;  Surgeon: [name], MD;  Location: [place];  Service: Orthopedics;  Laterality: Right;   AMPUTATION Left 02/02/2021   Procedure: LEFT FOOT 4TH RAY AMPUTATION;  Surgeon: [name], MD;  Location: [place];  Service: Orthopedics;  Laterality: Left;   AMPUTATION Left 05/09/2021   Procedure: LEFT BELOW KNEE AMPUTATION;  Surgeon: [name], MD;  Location: [place];  Service: Orthopedics;  Laterality: Left;   APPLICATION OF WOUND VAC Right 12/15/2018   Procedure: APPLICATION OF WOUND VAC;  Surgeon: [name], MD;  Location: [place];  Service: Orthopedics;  Laterality: Right;   AV FISTULA PLACEMENT Left 03/25/2019   Procedure: LEFT ARM ARTERIOVENOUS (AV) FISTULA CREATION;  Surgeon: [name], MD;  Location: [place] [facility];  Service: Vascular;  Laterality: Left;   [place] Left 05/20/2019   Procedure: SECOND STAGE LEFT BASCILIC VEIN TRANSPOSITION;  Surgeon: [name], MD;  Location: [place] [facility];  Service: Vascular;  Laterality: Left;   CARDIAC CATHETERIZATION  02/17/2014   CHOLECYSTECTOMY     COLONOSCOPY  2011   in [place], [place]  2008   CYSTOSCOPY N/A 06/29/2020   Procedure: [place];  Surgeon: [name], MD;  Location: [place];  Service: Urology;  Laterality: N/A;   CYSTOSCOPY WITH FULGERATION Bilateral 06/17/2020   Procedure: CYSTOSCOPY,BILATERAL RETROGRADE, CLOT EVACUATION WITH FULGERATION OF THE BLADDER;  Surgeon: Janith Lima, MD;  Location: [place];  Service: Urology;  Laterality: Bilateral;   CYSTOSCOPY WITH FULGERATION N/A 06/28/2020   Procedure: CYSTOSCOPY WITH CLOT EVACUATION AND FULGERATION OF BLEEDERS;  Surgeon: [name], MD;  Location: [place];  Service: Urology;  Laterality: N/A;  1 HR  I & D EXTREMITY Right 12/15/2018   Procedure: DEBRIDEMENT RIGHT FOOT;  Surgeon: [name], MD;  Location: [place];  Service: Orthopedics;  Laterality: Right;   I & D EXTREMITY Right 08/27/2019   Procedure: PARTIAL CUBOID EXCISION RIGHT FOOT;  Surgeon: [name], MD;  Location: [place];  Service: Orthopedics;  Laterality: Right;   I & D EXTREMITY Right 01/07/2020   Procedure: RIGHT FOOT EXCISION INFECTED BONE;  Surgeon: [name], MD;  Location: [place];  Service: Orthopedics;  Laterality: Right;    NECK SURGERY     novemver 2019   TRANSURETHRAL RESECTION OF PROSTATE N/A 06/28/2020   Procedure: TRANSURETHRAL RESECTION OF THE PROSTATE (TURP);  Surgeon: [name], MD;  Location: [name];  Service: Urology;  Laterality: N/A;   WISDOM TOOTH EXTRACTION     Social History:  reports that he quit smoking about 32 years ago. His smoking use included cigars. He started smoking about 48 years ago. He has never used smokeless tobacco. He reports that he does not drink alcohol and does not use drugs.  Family / Support Systems Marital Status: Married Patient Roles: Spouse, Parent Spouse/Significant Other: Remo Lipps [phone] Other Supports: Friends and church members Anticipated Caregiver: wife and family Ability/Limitations of Caregiver: Wife has been assisting with his PD dailysis prior to admission Caregiver Availability: 24/7 Family Dynamics: Close knit family who will assist pt with his care needs. Wife is his main caregiver and will assist him at DC  Social History Preferred language: English Religion: Jewish Cultural Background: No issues Education: Some Medical sales representative - How often do you need to have someone help you when you read instructions, pamphlets, or other written material from your doctor or pharmacy?: Never Writes: Yes Employment Status: Retired Public relations account executive Issues: No issues Guardian/Conservator: None-according to MD pt is capable of making his own decisions while here   Abuse/Neglect Abuse/Neglect Assessment Can Be Completed: Yes Physical Abuse: Denies Verbal Abuse: Denies Sexual Abuse: Denies Exploitation of patient/patient's resources: Denies Self-Neglect: Denies  Patient response to: Social Isolation - How often do you feel lonely or isolated from those around you?: Never  Emotional Status Pt's affect, behavior and adjustment status: Pt is motivated to do well and regain his independence. His wife is a strong support and will assist  him. She already does with his PD 4x week. He is ready to do rehab and hopeful he will do well here Recent Psychosocial Issues: other health issues Psychiatric History: No history seems to be coping appropriately and able to verbalize his feelings and concerns about losing his leg. Substance Abuse History: No issues  Patient / Family Perceptions, Expectations & Goals Pt/Family understanding of illness & functional limitations: Pt and wife can explain his amputation and talk with the MD and feel they have a good understanding of his treatment plan going forward. Premorbid pt/family roles/activities: Husband, father, grandfather, retiree, church member, etc Anticipated changes in roles/activities/participation: resume Pt/family expectations/goals: Pt states: " I hope to do well here and recover and be as independent as I can be when I leave here."  [employer]: None Premorbid Home Care/DME Agencies: Other (Comment) (has ramp into home, wc, rw, rollator, tub seat and cane) Transportation available at discharge: Wife Is the patient able to respond to transportation needs?: Yes In the past 12 months, has lack of transportation kept you from medical appointments or from getting medications?: No In the past 12 months, has lack of transportation kept you from  meetings, work, or from getting things needed for daily living?: No  Discharge Planning Living Arrangements: Spouse/significant other Support Systems: Spouse/significant other, Children, Other relatives, Friends/neighbors, Church/faith community Type of Residence: Private residence Insurance Resources: [employer], Multimedia programmer (specify) Nurse, mental health) Financial Resources: [place], Family Support Financial Screen Referred: No Living Expenses: Own Money Management: Patient, Spouse Does the patient have any problems obtaining your medications?: No Home Management: Wife Patient/Family Preliminary Plans: Retrun  home with wife who is able to assist is needed. Pt is hopeful he can be mod/i from wheelchair at least. Will work on discharge needs and await team conference Wed. Care Coordinator Anticipated Follow Up Needs: HH/OP  Clinical Impression Pt is motivated and hopeful he will do well here. His wife is a strong support and already assists him with his PD at home. Made aware team conference Wed and Christina-SW will be back and update both of them regarding team conference results.  Elease Hashimoto 05/14/2021, 11:32 AM

## 2021-05-14 NOTE — Progress Notes (Addendum)
[facility] Progress Note  Subjective: seen in room, working w/ PT, sliding board  Vitals:   05/13/21 1329 05/13/21 1935 05/14/21 0530 05/14/21 0754  BP: 140/67 128/60 (!) 145/54 125/67  Pulse: 65 63 66 66  Resp: 18 16 17 16   Temp: 98.3 F (36.8 C) 98.4 F (36.9 C) 98 F (36.7 C) 98.1 F (36.7 C)  TempSrc:    Oral  SpO2: 100% 100% 99% 100%  Weight:      Height:        Exam: Gen: Appears comfortable sitting up in bed, NT at bedside CVS: Pulse regular rhythm, normal rate, S1 and S2 normal Resp: Clear to auscultation bilaterally, no rales/rhonchi Abd: Soft, obese, nontender, bowel sounds normal Ext: Status post left below-knee amputation with clean dressing, left brachiocephalic fistula with palpable thrill.      OP HD: NxStage HHD 4x/ week   2K lactate 45 AVF w/ buttonholes  15g    89kg  Hep none  - mircera 100 mcg SQ last given 1/26 > due 2/09   Assessment/ Plan: SP L BKA - for nonhealing diabetic foot ulcer/osteomyelitis, done by Dr. [name] on 05/09/21.  He is currently admitted to the inpatient rehabilitation unit.   ESRD: Usually 4d/week at home, plan MWF schedule while in the hospital. HD tomorrow.  Hypertension/volume: BP good on Losartan and Coreg.  euvolemic on exam.   Anemia: Hgb 8.5- 10 here. Esa due on 2/09 > ordered darbe 100 mcg weekly on Thursday, next 2/09.    Metabolic bone disease: Calcium and phosphorus levels currently at goal. Continue calcitriol and renvela.   Nutrition:  Alb 3.3, on protein supplement.  Diabetes mellitus: on insulin, management per primary team     Rob Lindsi Bayliss 05/14/2021, 12:32 PM   Recent Labs  Lab 05/10/21 0209 05/14/21 0633  K 4.6 4.3  BUN 48* 89*  CREATININE 5.35* 10.60*  ALBUMIN 3.3* 2.9*  CALCIUM 8.4* 8.3*  PHOS 4.1  --    Inpatient medications:  acetaminophen  1,000 mg Oral TID   allopurinol  200 mg Oral Daily   vitamin C  1,000 mg Oral Daily   calcitRIOL  0.5 mcg Oral Daily   carvedilol  12.5 mg  Oral BID WC   docusate sodium  100 mg Oral TID   insulin aspart  0-6 Units Subcutaneous TID WC   insulin aspart  2 Units Subcutaneous TID WC   levothyroxine  112 mcg Oral QAC breakfast   losartan  100 mg Oral Daily   nutrition supplement (JUVEN)  1 packet Oral BID BM   pantoprazole  40 mg Oral Daily   polyvinyl alcohol  1-2 drop Both Eyes Daily   pregabalin  150 mg Oral Daily   pregabalin  300 mg Oral QHS   rosuvastatin  10 mg Oral Daily   [START ON 05/15/2021] Semaglutide(0.25 or 0.5MG /DOS)  0.5 mg Subcutaneous Q7 days   senna  2 tablet Oral QHS   sevelamer carbonate  800 mg Oral TID WC   Vitamin D (Ergocalciferol)  50,000 Units Oral Q7 days   zinc sulfate  220 mg Oral Daily    phenol, polyethylene glycol, potassium chloride

## 2021-05-14 NOTE — Progress Notes (Signed)
Occupational Therapy Session Note  Patient Details  Name: [name] MRN: [id number] Date of Birth: 10/22/1950  Today's Date: 05/14/2021 OT Individual Time: 0830-0930 OT Individual Time Calculation (min): 60 min    Short Term Goals: Week 1:  OT Short Term Goal 1 (Week 1): Pt will complete 1/3 components of toileting with CGA OT Short Term Goal 2 (Week 1): Pt will complete LB dressing at sit<stand level with 1 assist OT Short Term Goal 3 (Week 1): Pt will complete UB self care EOB with no more than supervision assist for sitting balance  Skilled Therapeutic Interventions/Progress Updates:    Pt received in bed and engaged in conversation with me, discussing where he is from and previous career. Based on conversation, pt did not demonstrate cognitive issues.  Pt agreeable to engaging in B/d from EOB.  Pt able to explain well how he has been doing things at home.  He said it has always been safer to do lateral leans vs standing with clothing management due to R foot.  Pt able to doff and don underwear and pants with lateral leans, cleanse front perineal area and leg.  He also donned sock and shoe with AFO without A.  Supervision with sitting EOB.   Pt then used slide board to wc to his L. Did demo first with cues on how to use board. Pt did repeat demo with only CGA.   From wc pt sat at sink to complete oral care. Attempted 1 stand at sink. Pt began well with good forward wt shift and pushing up with arms and leg but did not feel comfortable transferring wt from arm rests to sink.  Pt has a transport chair he wants to use but will need to do a small scoot/squat over arm rest during slide board transfers as armrest do not move on transport chair.   Pt resting in wc with all needs met and alarm belt on.  Therapy Documentation Precautions:  Precautions Precautions: Fall Precaution Comments: wound vac L LE Required Braces or Orthoses: Other Brace Other Brace: limb protector L LE, AFO with  shoe component to R LE Restrictions Weight Bearing Restrictions: Yes LLE Weight Bearing: Non weight bearing    Vital Signs: Therapy Vitals Temp: 98.1 F (36.7 C) Temp Source: Oral Pulse Rate: 66 Resp: 16 BP: 125/67 Patient Position (if appropriate): Lying Oxygen Therapy SpO2: 100 % O2 Device: Room Air Patient Activity (if Appropriate): In bed Pulse Oximetry Type: Intermittent Pain: Pain Assessment Pain Scale: 0-10 Pain Score: 5  ADL: ADL Grooming: Modified independent Where Assessed-Grooming: Sitting at sink Upper Body Bathing: Supervision/safety Where Assessed-Upper Body Bathing: Edge of bed Lower Body Bathing: Minimal assistance Where Assessed-Lower Body Bathing: Edge of bed Upper Body Dressing: Setup Where Assessed-Upper Body Dressing: Edge of bed Lower Body Dressing: Minimal assistance Where Assessed-Lower Body Dressing: Edge of bed Toileting: Not assessed Toilet Transfer: Not assessed Tub/Shower Transfer: Not assessed   Therapy/Group: Individual Therapy  [place] 05/14/2021, 9:47 AM

## 2021-05-14 NOTE — Progress Notes (Signed)
Followed up HD schedule x3 per [facility] they have a lot of patients and he's on the list to come today. Per HDRN ok to hold BP meds. BP 134/47.

## 2021-05-14 NOTE — Progress Notes (Signed)
PROGRESS NOTE   Subjective/Complaints:  Pt reports going to have BM as soon as nursing "gets him t toilet".   Upped tylenol dose working much better.  Pain tolerable for L BKA; and better  2 BM's today per chart.   ROS:  Pt denies SOB, abd pain, CP, N/V/(+) C/D, and vision changes    Objective:   No results found. Recent Labs    05/14/21 0633  WBC 4.7  HGB 8.7*  HCT 25.6*  PLT 95*   Recent Labs    05/14/21 0633  NA 136  K 4.3  CL 96*  CO2 24  GLUCOSE 149*  BUN 89*  CREATININE 10.60*  CALCIUM 8.3*     Intake/Output Summary (Last 24 hours) at 05/14/2021 1902 Last data filed at 05/14/2021 1307 Gross per 24 hour  Intake 540 ml  Output 0 ml  Net 540 ml        Physical Exam: Vital Signs Blood pressure (!) 134/47, pulse 66, temperature 98 F (36.7 C), resp. rate 18, height 5\' 11"  (1.803 m), weight 96.9 kg, SpO2 100 %.    General: awake, alert, appropriate, sitting up in bed; irritable; NAD HENT: conjugate gaze; oropharynx moist CV: regular rate; no JVD Pulmonary: CTA B/L; no W/R/R- good air movement GI: soft, NT, ND, (+)BS- firmish/not hard; hypoactive BS Psychiatric: appropriate but irritable;  Neurological: Ox3  Skin:    Comments: Left BKA dressed appropriately tender  Neurological:     Comments: Patient is alert.  Makes eye contact with examiner.  Oriented x3   Assessment/Plan: 1. Functional deficits which require 3+ hours per day of interdisciplinary therapy in a comprehensive inpatient rehab setting. Physiatrist is providing close team supervision and 24 hour management of active medical problems listed below. Physiatrist and rehab team continue to assess barriers to discharge/monitor patient progress toward functional and medical goals  Care Tool:  Bathing    Body parts bathed by patient: Left arm, Right arm, Chest, Abdomen, Front perineal area, Face, Right lower leg, Right upper leg    Body parts bathed by helper: Buttocks Body parts n/a: Left upper leg, Left lower leg   Bathing assist Assist Level: Minimal Assistance - Patient > 75%     Upper Body Dressing/Undressing Upper body dressing   What is the patient wearing?: Pull over shirt    Upper body assist Assist Level: Set up assist    Lower Body Dressing/Undressing Lower body dressing      What is the patient wearing?: Pants, Underwear/pull up     Lower body assist Assist for lower body dressing: Minimal Assistance - Patient > 75% (A due to wound vac)     Toileting Toileting Toileting Activity did not occur (Clothing management and hygiene only): N/A (no void or bm)  Toileting assist Assist for toileting: 2 Helpers     Transfers Chair/bed transfer  Transfers assist     Chair/bed transfer assist level: Contact Guard/Touching assist (slideboard)     Locomotion Ambulation   Ambulation assist   Ambulation activity did not occur: Safety/medical concerns (Poor motor planning, LLE NWB, Orthotic shoe on RLE, pain)          Walk 10  feet activity   Assist  Walk 10 feet activity did not occur: Safety/medical concerns (Poor motor planning, LLE NWB, Orthotic shoe on RLE, pain)        Walk 50 feet activity   Assist Walk 50 feet with 2 turns activity did not occur: Safety/medical concerns (Poor motor planning, LLE NWB, Orthotic shoe on RLE, pain)         Walk 150 feet activity   Assist Walk 150 feet activity did not occur: Safety/medical concerns (Poor motor planning, LLE NWB, Orthotic shoe on RLE, pain)         Walk 10 feet on uneven surface  activity   Assist Walk 10 feet on uneven surfaces activity did not occur: Safety/medical concerns (Poor motor planning, LLE NWB, Orthotic shoe on RLE, pain)         Wheelchair     Assist Is the patient using a wheelchair?: Yes Type of Wheelchair: Manual    Wheelchair assist level: Set up assist, Supervision/Verbal  cueing Max wheelchair distance: >150'    Wheelchair 50 feet with 2 turns activity    Assist        Assist Level: Supervision/Verbal cueing   Wheelchair 150 feet activity     Assist      Assist Level: Supervision/Verbal cueing   Blood pressure (!) 134/47, pulse 66, temperature 98 F (36.7 C), resp. rate 18, height 5\' 11"  (1.803 m), weight 96.9 kg, SpO2 100 %. Medical Problem List and Plan: 1. Functional deficits secondary to left BKA 05/09/2021 secondary to peripheral vascular disease poor healing of recent left fourth and fifth ray amputation             -patient may shower but incision must be covered             -ELOS/Goals: 8-12 days S           con't CIR- PT and OT 2.  Antithrombotics: -DVT/anticoagulation:  Mechanical: Antiembolism stockings, thigh (TED hose) Right lower extremity             -antiplatelet therapy: N/A 3. Postoperative pain: d/c opioid medications due to sedation. Lyrica 150 mg daily and 300 mg nightly, oxycodone as needed. Schedule Tylenol 1000mg  TID, LFTs reviewed and stable. 2/6- Pain tolerable with regimen- doesn't want opiates- doing better- con't tylenol and lyrica- might need to change Lyrica dose due to renal /ESRD 4. Mood: Provide emotional support             -antipsychotic agents: N/A 5. Neuropsych: This patient is capable of making decisions on his own behalf. 6. Skin/Wound Care: Routine skin checks 7. Hyponatremia: monitor Na with HD labs.  8.  End-stage renal disease.  Continue hemodialysis as per renal services. 2/6- HD today 9.  Acute on chronic anemia.  Hgb improving, monitor w/ HD labs.  10.  Hypertension.  Cozaar 100 mg daily, Coreg 12.5 mg twice daily.  Monitor with increased mobility  2/6- BP controlled- con't regimen 11.  Diabetes mellitus with peripheral neuropathy.  Hemoglobin A1c 5.5.  NovoLog 2 units 3 times daily. Discussed outpatient Qutenza and he is very interested, will schedule.  12.  Hypothyroidism.  Synthroid 13.   Hyperlipidemia.  Crestor 14.  History of gout.  Continue allopurinol.  Monitor for any gout flareups 15.  History of atrial flutter with ablation 04/06/2020.  Follow-up cardiology services as needed.  Cardiac rate controlled 16.  History of prostate cancer status post TURP 06/28/2020.  Follow-up outpatient 17.  Obesity.  BMI  29.99.  Dietary follow-up 18.  GERD.  Protonix 19. Sedation: hold opioid medications 20. Vitamin D deficiency: start ergocalciferol 50,000U once per week for 7 weeks. 21. Constipation: increase colace to 100mg  TID and add Senna 2 tabs HS  2/6- was trying to have BM this AM- needs staff to get him to toilet/BSC. Was able to go 2x today.    I spent a total of  36  minutes on total care today- >50% coordination of care- due to looking over lyrica dosing; and complex review of notes/chart.    LOS: 3 days A FACE TO FACE EVALUATION WAS PERFORMED  Shontel Santee 05/14/2021, 7:02 PM

## 2021-05-14 NOTE — Progress Notes (Signed)
Inpatient Rehabilitation  Patient information reviewed and entered into eRehab system by Dmonte Maher M. Sahaj Bona, M.A., CCC/SLP, PPS Coordinator.  Information including medical coding, functional ability and quality indicators will be reviewed and updated through discharge.    

## 2021-05-15 DIAGNOSIS — Z89512 Acquired absence of left leg below knee: Secondary | ICD-10-CM | POA: Diagnosis not present

## 2021-05-15 LAB — GLUCOSE, CAPILLARY
Glucose-Capillary: 146 mg/dL — ABNORMAL HIGH (ref 70–99)
Glucose-Capillary: 104 mg/dL — ABNORMAL HIGH (ref 70–99)
Glucose-Capillary: 130 mg/dL — ABNORMAL HIGH (ref 70–99)
Glucose-Capillary: 118 mg/dL — ABNORMAL HIGH (ref 70–99)

## 2021-05-15 MED ORDER — NONFORMULARY OR COMPOUNDED ITEM
600.0000 mg | Freq: Every day | Status: DC
  Filled 2021-05-15: qty 1

## 2021-05-15 MED ORDER — ACETYLCYSTEINE 20 % IN SOLN
600.0000 mg | Freq: Every day | RESPIRATORY_TRACT | Status: DC
  Filled 2021-05-15: qty 4

## 2021-05-15 MED ORDER — NON FORMULARY: 600.0000 mg | Freq: Every day | Status: DC

## 2021-05-15 MED ORDER — CARVEDILOL 12.5 MG PO TABS
25.0000 mg | ORAL_TABLET | Freq: Two times a day (BID) | ORAL | Status: DC
  Administered 2021-05-15 – 2021-05-24 (×15): 25 mg via ORAL
  Filled 2021-05-15 (×19): qty 2

## 2021-05-15 MED ORDER — HOME MED STORE IN PYXIS: 1.0000 | Freq: Every morning | Status: DC

## 2021-05-15 NOTE — Progress Notes (Signed)
Occupational Therapy Session Note  Patient Details  Name: [name] MRN: [id number] Date of Birth: 27-May-1950  Today's Date: 05/15/2021 OT Individual Time: 1275-1700 OT Individual Time Calculation (min): 41 min    Short Term Goals: Week 1:  OT Short Term Goal 1 (Week 1): Pt will complete 1/3 components of toileting with CGA OT Short Term Goal 2 (Week 1): Pt will complete LB dressing at sit<stand level with 1 assist OT Short Term Goal 3 (Week 1): Pt will complete UB self care EOB with no more than supervision assist for sitting balance  Skilled Therapeutic Interventions/Progress Updates:  Skilled OT intervention completed with focus on self-care, functional w/c mobility, BUE endurance, mass practice sit > stands for prep for ADL. Pt received seated on toilet with stedy in front of pt, with pt alone. Pt completed posterior pericare with distant supervision using lateral leans and grab bar for UE support. Therapist used stedy for transfer off of toilet, with pt able to stand with supervision, and min A required for pulling up pants, sitting with CGA in w/c. Pt completed seated self-care at sink for oral/hair care with mod I. Education provided to pt about phantom pain, as pt reports mod pain in L foot, and also educated about mirror therapy however pt declining/resistant to discussing the topic. Pt self-propelled in w/c with supervision about 100 ft to promote BUE endurance needed for self-care and mobility, with education provided about w/c mobility in his home/community environment. Back in room, pt able to complete 5 stands using RW with fading assist from min A to CGA with technique of pushing up on R from w/c and stabilizing with L hand on RW. Pt resistant to trying this method, however had increased insight to functional efficiency with this method. Pt left seated in w/c, with belt alarm on and all needs in reach at end of session.   Therapy Documentation Precautions:   Precautions Precautions: Fall Precaution Comments: wound vac L LE Required Braces or Orthoses: Other Brace Other Brace: limb protector L LE, AFO with shoe component to R LE Restrictions Weight Bearing Restrictions: Yes LLE Weight Bearing: Non weight bearing  Pain: Unrated phantom pain in L ankle, declined intervention   Therapy/Group: Individual Therapy  Keyunna Coco E Kalisi Bevill 05/15/2021, 7:36 AM

## 2021-05-15 NOTE — Progress Notes (Signed)
Physical Therapy Session Note  Patient Details  Name: [name] MRN: [id number] Date of Birth: 1950-08-10  Today's Date: 05/15/2021 PT Individual Time: [phone] and [phone] PT Individual Time Calculation (min): 55 min and 26 min  Short Term Goals: Week 1:  PT Short Term Goal 1 (Week 1): Pt will perform sit <>stand w/LRAD and mod A PT Short Term Goal 2 (Week 1): Pt will perform bed <>chair transfer w/LRAD and mod A PT Short Term Goal 3 (Week 1): Pt will initiate gait training w/mod A and LRAD  Skilled Therapeutic Interventions/Progress Updates:   Treatment Session 1 Received pt semi-reclined in bed, pt agreeable to PT treatment, and reported pain 6/10 in L residual limb (premedicated). Repositioning and rest breaks done to reduce pain levels. Session with emphasis on functional mobility/transfers, generalized strengthening, and improved activity tolerance. Pt transferred semi-reclined<>sitting EOB with HOB elevated and use of bedrail with supervision and donned R AFO with max A. Doffed dirty shirt and donned clean one with supervision. Introduced squat<>pivots this session and pt transferred bed<>WC squat<>pivot to R with heavy min A but required mod A when transferring WC<>bed to L, only getting hips halfway on bed and loosing balance posteriorly requiring assist to correct - cues for hand placement and overall technique. Pt transported to/from room in [facility] dependently for time management purposes and transferred on/off Nustep via squat<>pivot to R with heavy min A. Pt performed BUE and RLE strengthening on Nustep at workload 4 for 8 minutes for a total of 258 steps with emphasis on cardiovascular endurance. Pt performed the following exercises with emphasis on UE/LE strength: -WC pushups 2x8 -LAQ on RLE with 3lb ankle weight 2x15 -hip flexion on RLE with 3lb ankle weight 2x12 Returned to room and MD arrived for morning rounds. Concluded session with pt sitting in WC, needs within reach, and  seatbelt alarm on awaiting upcoming OT session.   Treatment Session 2 Received pt sitting in WC, pt agreeable to PT treatment, and reported pain 3/10 in L residual limb (premedicated). Session with emphasis on functional mobility/transfers, generalized strengthening, and improved activity tolerance. Pt transported to/from room in [facility] dependently for time management purposes and performed BUE strengthening on UBE on level 3 alternating 1 minute forward and 1 minute backwards for a total of 4 minutes. Worked on UE and core strength batting ball with 2lb dowel x 20 reps increasing to 3lb dowel x20 reps. Pt then performed single arm bicep curls with 7lb dumbbell 2x12 bilaterally. Concluded session with pt sitting in WC, needs within reach, and seatbelt alarm on. NT present at bedside checking blood glucose.   Therapy Documentation Precautions:  Precautions Precautions: Fall Precaution Comments: wound vac L LE Required Braces or Orthoses: Other Brace Other Brace: limb protector L LE, AFO with shoe component to R LE Restrictions Weight Bearing Restrictions: Yes LLE Weight Bearing: Non weight bearing  Therapy/Group: Individual Therapy Alfonse Alpers PT, DPT   05/15/2021, 7:22 AM

## 2021-05-15 NOTE — Progress Notes (Signed)
PROGRESS NOTE   Subjective/Complaints: Still with some constipation but does not want additional stool softeners as he is afraid to get diarrhea.  Tylenol doe help with pain.  Ordered NAC   ROS:  Pt denies SOB, abd pain, CP, N/V/(+) C/D, and vision changes, pain better controlled  Objective:   No results found. Recent Labs    05/14/21 0633  WBC 4.7  HGB 8.7*  HCT 25.6*  PLT 95*   Recent Labs    05/14/21 0633  NA 136  K 4.3  CL 96*  CO2 24  GLUCOSE 149*  BUN 89*  CREATININE 10.60*  CALCIUM 8.3*     Intake/Output Summary (Last 24 hours) at 05/15/2021 1008 Last data filed at 05/15/2021 0732 Gross per 24 hour  Intake 360 ml  Output --  Net 360 ml        Physical Exam: Vital Signs Blood pressure (!) 150/65, pulse 69, temperature 98.4 F (36.9 C), resp. rate 18, height 5\' 11"  (1.803 m), weight 94.2 kg, SpO2 99 %.  Gen: no distress, normal appearing HEENT: oral mucosa pink and moist, NCAT Cardio: Reg rate Chest: normal effort, normal rate of breathing GI: soft, NT, ND, (+)BS- firmish/not hard; hypoactive BS Psychiatric: appropriate but irritable;  Neurological: Ox3  Skin:    Comments: Left BKA dressed appropriately tender  Neurological:     Comments: Patient is alert.  Makes eye contact with examiner.  Oriented x3   Assessment/Plan: 1. Functional deficits which require 3+ hours per day of interdisciplinary therapy in a comprehensive inpatient rehab setting. Physiatrist is providing close team supervision and 24 hour management of active medical problems listed below. Physiatrist and rehab team continue to assess barriers to discharge/monitor patient progress toward functional and medical goals  Care Tool:  Bathing    Body parts bathed by patient: Left arm, Right arm, Chest, Abdomen, Front perineal area, Face, Right lower leg, Right upper leg   Body parts bathed by helper: Buttocks Body parts  n/a: Left upper leg, Left lower leg   Bathing assist Assist Level: Minimal Assistance - Patient > 75%     Upper Body Dressing/Undressing Upper body dressing   What is the patient wearing?: Pull over shirt    Upper body assist Assist Level: Set up assist    Lower Body Dressing/Undressing Lower body dressing      What is the patient wearing?: Pants, Underwear/pull up     Lower body assist Assist for lower body dressing: Minimal Assistance - Patient > 75% (A due to wound vac)     Toileting Toileting Toileting Activity did not occur (Clothing management and hygiene only): N/A (no void or bm)  Toileting assist Assist for toileting: Minimal Assistance - Patient > 75%     Transfers Chair/bed transfer  Transfers assist     Chair/bed transfer assist level: Moderate Assistance - Patient 50 - 74% (squat<>pivot to L)     Locomotion Ambulation   Ambulation assist   Ambulation activity did not occur: Safety/medical concerns (Poor motor planning, LLE NWB, Orthotic shoe on RLE, pain)          Walk 10 feet activity   Assist  Walk 10  feet activity did not occur: Safety/medical concerns (Poor motor planning, LLE NWB, Orthotic shoe on RLE, pain)        Walk 50 feet activity   Assist Walk 50 feet with 2 turns activity did not occur: Safety/medical concerns (Poor motor planning, LLE NWB, Orthotic shoe on RLE, pain)         Walk 150 feet activity   Assist Walk 150 feet activity did not occur: Safety/medical concerns (Poor motor planning, LLE NWB, Orthotic shoe on RLE, pain)         Walk 10 feet on uneven surface  activity   Assist Walk 10 feet on uneven surfaces activity did not occur: Safety/medical concerns (Poor motor planning, LLE NWB, Orthotic shoe on RLE, pain)         Wheelchair     Assist Is the patient using a wheelchair?: Yes Type of Wheelchair: Manual    Wheelchair assist level: Set up assist, Supervision/Verbal cueing Max wheelchair  distance: >150'    Wheelchair 50 feet with 2 turns activity    Assist        Assist Level: Supervision/Verbal cueing   Wheelchair 150 feet activity     Assist      Assist Level: Supervision/Verbal cueing   Blood pressure (!) 150/65, pulse 69, temperature 98.4 F (36.9 C), resp. rate 18, height 5\' 11"  (1.803 m), weight 94.2 kg, SpO2 99 %. Medical Problem List and Plan: 1. Functional deficits secondary to left BKA 05/09/2021 secondary to peripheral vascular disease poor healing of recent left fourth and fifth ray amputation             -patient may shower but incision must be covered             -ELOS/Goals: 8-12 days S      Continue CIR- PT and OT 2.  Antithrombotics: -DVT/anticoagulation:  Mechanical: Antiembolism stockings, thigh (TED hose) Right lower extremity             -antiplatelet therapy: N/A 3. Postoperative pain: d/c opioid medications due to sedation. Lyrica 150 mg daily and 300 mg nightly, oxycodone as needed. Schedule Tylenol 1000mg  TID, start 1,000mg  vitamin C daily. LFTs reviewed and stable. 2/6- Pain tolerable with regimen- doesn't want opiates- doing better- con't tylenol and lyrica- might need to change Lyrica dose due to renal /ESRD 4. Mood: Provide emotional support             -antipsychotic agents: N/A 5. Neuropsych: This patient is capable of making decisions on his own behalf. 6. Skin/Wound Care: Routine skin checks 7. Hyponatremia: monitor Na with HD labs.  8.  End-stage renal disease.  Continue hemodialysis as per renal services. 2/6- HD today 9.  Acute on chronic anemia.  Hgb improving, monitor w/ HD labs.  10.  Hypertension.  Cozaar 100 mg daily, Increase Coreg to 25 mg twice daily.  Monitor with increased mobility 11.  Diabetes mellitus with peripheral neuropathy.  Hemoglobin A1c 5.5.  NovoLog 2 units 3 times daily. Discussed outpatient Qutenza and he is very interested, will schedule.  12.  Hypothyroidism.  Synthroid 13.  Hyperlipidemia.   Crestor 14.  History of gout.  Continue allopurinol.  Monitor for any gout flareups 15.  History of atrial flutter with ablation 04/06/2020.  Follow-up cardiology services as needed.  Cardiac rate controlled 16.  History of prostate cancer status post TURP 06/28/2020.  Follow-up outpatient 17.  Obesity.  BMI 29.99.  Dietary follow-up 18.  GERD.  Protonix 19. Sedation: hold  opioid medications 20. Vitamin D deficiency: start ergocalciferol 50,000U once per week for 7 weeks. 21. Constipation: increase colace to 100mg  TID and add Senna 2 tabs HS. Discussed increasing bowel regimen but he defers at this time 22. Liver protection while on Tylenol: start NAC 600mg  daily.      LOS: 4 days A FACE TO FACE EVALUATION WAS PERFORMED  [name] [name] [name] 05/15/2021, 10:08 AM

## 2021-05-15 NOTE — Progress Notes (Signed)
Physical Therapy Session Note  Patient Details  Name: [name] MRN: [id number] Date of Birth: 28-May-1950  Today's Date: 05/15/2021 PT Individual Time: 1000-1100 PT Individual Time Calculation (min): 60 min   Short Term Goals: Week 1:  PT Short Term Goal 1 (Week 1): Pt will perform sit <>stand w/LRAD and mod A PT Short Term Goal 2 (Week 1): Pt will perform bed <>chair transfer w/LRAD and mod A PT Short Term Goal 3 (Week 1): Pt will initiate gait training w/mod A and LRAD  Skilled Therapeutic Interventions/Progress Updates:    Pt received seated in w/c in room, agreeable to PT session. Pt reports pain in L residual limb, not rated and premedicated prior to start of therapy session. Manual w/c propulsion to/from therapy gym at Supervision level with use of BUE, increased time needed to complete. Sit to stand in // bars with min A. Pt unable to take any steps in // bars due to weakness and RLE impairments and history of foot/ankle surgeries. Pt reports he has a goal of transferring to/from w/c without removing armrests due to having a w/c without removable armrests. Squat pivot transfer w/c to/from mat table going L/R with min A. Reviewed head/hips relationship but pt unable to lift buttocks enough during transfer in order to clear arm rest safely. Encouraged pt to work towards sit to stand with RW and transfers with RW. Sit to stand x 5 reps to RW from elevated mat table, focus on safe UE placement and RLE placement during transfer. Pt able to stand safely to RW with min A but unable to perform stand pivot at this time. Pt left seated in w/c in room with needs in reach, quick release belt and chair alarm in place at end of session.  Therapy Documentation Precautions:  Precautions Precautions: Fall Precaution Comments: wound vac L LE Required Braces or Orthoses: Other Brace Other Brace: limb protector L LE, AFO with shoe component to R LE Restrictions Weight Bearing Restrictions: Yes LLE  Weight Bearing: Non weight bearing      Therapy/Group: Individual Therapy   Excell Seltzer, PT, DPT, CSRS  05/15/2021, 11:39 AM

## 2021-05-16 LAB — GLUCOSE, CAPILLARY
Glucose-Capillary: 116 mg/dL — ABNORMAL HIGH (ref 70–99)
Glucose-Capillary: 148 mg/dL — ABNORMAL HIGH (ref 70–99)
Glucose-Capillary: 126 mg/dL — ABNORMAL HIGH (ref 70–99)
Glucose-Capillary: 150 mg/dL — ABNORMAL HIGH (ref 70–99)

## 2021-05-16 LAB — BASIC METABOLIC PANEL
Anion gap: 15 (ref 5–15)
BUN: 70 mg/dL — ABNORMAL HIGH (ref 8–23)
CO2: 24 mmol/L (ref 22–32)
Calcium: 8.8 mg/dL — ABNORMAL LOW (ref 8.9–10.3)
Chloride: 99 mmol/L (ref 98–111)
Creatinine, Ser: 8.51 mg/dL — ABNORMAL HIGH (ref 0.61–1.24)
GFR, Estimated: 6 mL/min — ABNORMAL LOW (ref >60)
Glucose, Bld: 175 mg/dL — ABNORMAL HIGH (ref 70–99)
Potassium: 4.7 mmol/L (ref 3.5–5.1)
Sodium: 138 mmol/L (ref 135–145)

## 2021-05-16 MED ORDER — BISACODYL 10 MG RE SUPP
10.0000 mg | Freq: Once | RECTAL | Status: DC
Start: 2021-05-16 — End: 2021-05-24
  Filled 2021-05-16: qty 1

## 2021-05-16 NOTE — Progress Notes (Signed)
Physical Therapy Session Note  Patient Details  Name: [name] MRN: [id number] Date of Birth: [birth year]  Today's Date: 05/16/2021 PT Individual Time: 0800-0842 PT Individual Time Calculation (min): 42 min   Short Term Goals: Week 1:  PT Short Term Goal 1 (Week 1): Pt will perform sit <>stand w/LRAD and mod A PT Short Term Goal 2 (Week 1): Pt will perform bed <>chair transfer w/LRAD and mod A PT Short Term Goal 3 (Week 1): Pt will initiate gait training w/mod A and LRAD  Skilled Therapeutic Interventions/Progress Updates:   Received pt sitting in [place] finishing breakfast. While pt finished eating breakfast, discussed recommendation for HHPT upon discharge and timeline for prosthetic fitting. Pt agreeable to PT treatment and reported pain 3/10 in L residual limb. Session with emphasis on functional mobility/transfers, simulated car transfers, and improved endurance with activity. Pt transported to/from room in [facility] dependently for time management purposes. Pt performed simulated car transfer via slideboard with CGA and cues for technique and hand placement and total A to manage wound vac - need to practice again using squat<>pivot method. Pt then performed RLE strengthening on Kinetron at 10 cm/sec for 1 minute x 4 trials with therapist providing manual counter resistance with emphasis on glute/quad strengthening. Returned to room and concluded session with pt sitting in WC at sink set up to brush teeth, with all needs within reach.   Therapy Documentation Precautions:  Precautions Precautions: Fall Precaution Comments: wound vac L LE Required Braces or Orthoses: Other Brace Other Brace: limb protector L LE, AFO with shoe component to R LE Restrictions Weight Bearing Restrictions: Yes LLE Weight Bearing: Non weight bearing  Therapy/Group: Individual Therapy Alfonse Alpers PT, DPT   05/16/2021, 7:44 AM

## 2021-05-16 NOTE — Progress Notes (Signed)
[facility] Progress Note  Subjective: seen on HD, no c/o's, acknowledges some vol overload but can't pull more than 2-3 L w/ HD  Vitals:   05/16/21 1430 05/16/21 1500 05/16/21 1530 05/16/21 1600  BP: (!) 176/72 (!) (P) 179/75 (!) 179/75 (!) 149/54  Pulse: 68 (P) 68 65 67  Resp: 15 (P) 17    Temp:      TempSrc:      SpO2:      Weight:      Height:        Exam: Gen: Appears comfortable sitting up in bed, NT at bedside CVS: Pulse regular rhythm, normal rate, S1 and S2 normal Resp: Clear to auscultation bilaterally, no rales/rhonchi Abd: Soft, obese, nontender, bowel sounds normal Ext: Status post left below-knee amputation with clean dressing, left brachiocephalic fistula with palpable thrill.      OP HD: NxStage HHD 4x/ week   2K lactate 45 AVF w/ buttonholes  15g    89kg  Hep none  - mircera 100 mcg SQ last given 1/26 > due 2/09   Assessment/ Plan: SP L BKA - for nonhealing diabetic foot ulcer/osteomyelitis, done by Dr. [name] on 05/09/21.  He is currently admitted to the inpatient rehabilitation unit.   ESRD: Usually 4d/week at home, plan MWF schedule while in the hospital. HD today.  Hypertension/volume: BP good on Losartan and Coreg. Wt's are up, +edema on exam. Needs higher UF goal next HD, 3 L. Tighten fluid restriction.   Anemia: Hgb 8.5- 10 here. Esa due on 2/09 > ordered darbe 100 mcg weekly on Thursday, next 2/09.    Metabolic bone disease: Calcium and phosphorus levels currently at goal. Continue calcitriol and renvela.   Nutrition:  Alb 3.3, on protein supplement.  Diabetes mellitus: on insulin, management per primary team Constipation: ordered 10 mg dulcolax for 6 pm tonight pt request     Rob Hassan Blackshire 05/16/2021, 4:17 PM   Recent Labs  Lab 05/10/21 0209 05/14/21 0633 05/16/21 1009  K 4.6 4.3 4.7  BUN 48* 89* 70*  CREATININE 5.35* 10.60* 8.51*  ALBUMIN 3.3* 2.9*  --   CALCIUM 8.4* 8.3* 8.8*  PHOS 4.1  --   --     Inpatient  medications:  acetaminophen  1,000 mg Oral TID   allopurinol  200 mg Oral Daily   vitamin C  1,000 mg Oral Daily   calcitRIOL  0.5 mcg Oral Daily   carvedilol  25 mg Oral BID WC   [START ON 05/17/2021] darbepoetin (ARANESP) injection - DIALYSIS  100 mcg Intravenous Q Thu-HD   docusate sodium  100 mg Oral TID   insulin aspart  0-6 Units Subcutaneous TID WC   insulin aspart  2 Units Subcutaneous TID WC   levothyroxine  112 mcg Oral QAC breakfast   losartan  100 mg Oral Daily   nutrition supplement (JUVEN)  1 packet Oral BID BM   pantoprazole  40 mg Oral Daily   polyvinyl alcohol  1-2 drop Both Eyes Daily   pregabalin  150 mg Oral Daily   pregabalin  300 mg Oral QHS   rosuvastatin  10 mg Oral Daily   Semaglutide(0.25 or 0.5MG /DOS)  0.5 mg Subcutaneous Q7 days   senna  2 tablet Oral QHS   sevelamer carbonate  800 mg Oral TID WC   Vitamin D (Ergocalciferol)  50,000 Units Oral Q7 days   zinc sulfate  220 mg Oral Daily    phenol, polyethylene glycol, potassium chloride

## 2021-05-16 NOTE — Progress Notes (Signed)
PHARMACIST - PHYSICIAN ORDER COMMUNICATION  CONCERNING: P&T Medication Policy on Herbal Medications  DESCRIPTION:  This patients order for:  NAC has been noted.  This product(s) is classified as an herbal or natural product. Due to a lack of definitive safety studies or FDA approval, nonstandard manufacturing practices, plus the potential risk of unknown drug-drug interactions while on inpatient medications, the Pharmacy and Therapeutics Committee does not permit the use of herbal or natural products of this type within [facility].   ACTION TAKEN: The pharmacy department is unable to verify this order at this time and your patient has been informed of this safety policy. Please reevaluate patients clinical condition at discharge and address if the herbal or natural product(s) should be resumed at that time.   Chamaine Stankus S. Alford Highland, PharmD, BCPS Clinical Staff Pharmacist [email or web address]

## 2021-05-16 NOTE — Patient Care Conference (Signed)
Inpatient RehabilitationTeam Conference and Plan of Care Update Date: 05/16/2021   Time: 11:40 AM    Patient Name: [name]      Medical Record Number: [id number]  Date of Birth: Aug 21, 1950 Sex: Male         Room/Bed: 4M06C/4M06C-01 Payor Info: Payor: MEDICARE / Plan: MEDICARE PART A AND B / Product Type: *No Product type* /    Admit Date/Time:  05/11/2021  3:08 PM  Primary Diagnosis:  Left below-knee amputee [facility])  Hospital Problems: Principal Problem:   Left below-knee amputee [facility])    Expected Discharge Date: Expected Discharge Date: 05/23/21  Team Members Present: Physician leading conference: Dr. Leeroy Cha Social Worker Present: Erlene Quan, BSW Nurse Present: Dorthula Nettles, RN PT Present: Becky Sax, PT OT Present: Other (comment) [facility] Augusto Garbe, OT) [place] Coordinator present : Gunnar Fusi, SLP     Current Status/Progress Goal Weekly Team Focus  Bowel/Bladder   continent bowel & bladder  remain continent  toilet as needed   Swallow/Nutrition/ Hydration             ADL's   Supervision bathing seated at sink, min A LB dressing, min A toileting, functional sit <> stands min A  supervision self-care, CGA transfers  functional transfers, BUE strengthening, limb care education, endurance   Mobility   bed mobility supervision, slideboard transfers CGA, sit<>stands min A with RW, unable to ambulate at this time  CGA, min A gait, Mod I WC mobility  functional mobility/transfers, generalized strengthening and ROM, amputee education, dynamic standing balance, D/C planning   Communication             Safety/Cognition/ Behavioral Observations            Pain   5/10 pain to left BKA  < 3  assess pain q 4hr and prn   Skin   wound vac, MASD foam dressing  no new breakdown  assess skin q shift and prn     Discharge Planning:  d/c with spouse   Team Discussion: Discontinue wound vac today. Pain in right foot and phantom pain to be followed in outpatient  setting. Non-pressure wound to buttocks. Discharging home with spouse with help of additional family providing 24/7 care.  Patient on target to meet rehab goals: yes, contact guard to mod I in [place]. Supervision with self-care, contact guard transfers. Will discontinue gait goal, unsafe currently. Min assist toileting currently. Will need TTB and slide board wanted by patient, but doesn't have a complete understanding of how to use. Will continue education/training.  *See Care Plan and progress notes for long and short-term goals.   Revisions to Treatment Plan:  Discontinue wound vac and gait goals   Teaching Needs: Family education, skin/wound care, medication/pain management, slide board training, transfer training, etc.  Current Barriers to Discharge: Decreased caregiver support, Wound care, Weight, Hemodialysis, Weight bearing restrictions, and Medication compliance  Possible Resolutions to Barriers: Family education Order recommended DME Follow-up HH-PT/OT     Medical Summary Current Status: overweight, right foot neuropathic pain, left limb residual pain and phantom limb pain, fluctuating BP, cognitive impairment with opioid medications  Barriers to Discharge: Medical stability;Wound care;Weight  Barriers to Discharge Comments: overweight, right foot neuropathic pain, left limb residual pain and phantom limb pain, fluctuating BP, cognitive impairment with opioid medications Possible Resolutions to [employer] Focus: provide dietary education, continue lyrica, discuss outpatient Qutenza, continue to monitor blood pressure TID, maximize tylenol right now   Continued Need for Acute Rehabilitation Level of  Care: The patient requires daily medical management by a physician with specialized training in physical medicine and rehabilitation for the following reasons: Direction of a multidisciplinary physical rehabilitation program to maximize functional independence : Yes Medical  management of patient stability for increased activity during participation in an intensive rehabilitation regime.: Yes Analysis of laboratory values and/or radiology reports with any subsequent need for medication adjustment and/or medical intervention. : Yes   I attest that I was present, lead the team conference, and concur with the assessment and plan of the team.   Cristi Loron 05/16/2021, 12:02 PM

## 2021-05-16 NOTE — Progress Notes (Signed)
Occupational Therapy Session Note  Patient Details  Name: [name] MRN: [id number] Date of Birth: 1950-09-20  Today's Date: 05/16/2021 OT Individual Time: 6387-5643 OT Individual Time Calculation (min): 53 min    Short Term Goals: Week 1:  OT Short Term Goal 1 (Week 1): Pt will complete 1/3 components of toileting with CGA OT Short Term Goal 2 (Week 1): Pt will complete LB dressing at sit<stand level with 1 assist OT Short Term Goal 3 (Week 1): Pt will complete UB self care EOB with no more than supervision assist for sitting balance  Skilled Therapeutic Interventions/Progress Updates:  Skilled OT intervention completed with focus on self-care, ADL retraining, functional transfers, DME education, BUE strengthening. Pt received seated in w/c at sink, finishing oral/grooming tasks with NT in room. Pt requesting to change clothes, with pt gathering items at w/c level with mod I, squat pivoted from w/c to EOB going towards L with min A with uncontrolled landing and pt with posterior trunk LOB. Educated pt on slowing the movement down to control transfers, and determined that pt would benefit from core control exercises. Min A required for threading BLEs into clothes and management of wound vac, however pt using lateral and posterior leans for dressing. Pt transferred back to R via lateral scoot with CGA. Discussed with pt about his shower set up at home, determining need for TTB and demonstrated transfer method to pt. Seated in w/c, pt completed the following exercises to promote strength needed for functional tasks:  With 4 pound dowel -Bicep flexion x12 -Chest presses x12 -Overhead presses x12 -Core rotations x24  Pt required cues for form and technique. Pt left seated in w/c, with chair alarm on and all needs in reach at end of session.    Therapy Documentation Precautions:  Precautions Precautions: Fall Precaution Comments: wound vac L LE Required Braces or Orthoses: Other  Brace Other Brace: limb protector L LE, AFO with shoe component to R LE Restrictions Weight Bearing Restrictions: Yes LLE Weight Bearing: Non weight bearing  Pain: No c/o pain   Therapy/Group: Individual Therapy  [name] [name] 05/16/2021, 7:44 AM

## 2021-05-16 NOTE — Progress Notes (Signed)
Physical Therapy Session Note  Patient Details  Name: [name] MRN: [id number] Date of Birth: Nov 18, 1950  Today's Date: 05/16/2021 PT Individual Time: 1108-1210 PT Individual Time Calculation (min): 62 min   Short Term Goals: Week 1:  PT Short Term Goal 1 (Week 1): Pt will perform sit <>stand w/LRAD and mod A PT Short Term Goal 2 (Week 1): Pt will perform bed <>chair transfer w/LRAD and mod A PT Short Term Goal 3 (Week 1): Pt will initiate gait training w/mod A and LRAD  Skilled Therapeutic Interventions/Progress Updates: Pt presented in w/c agreeable to therapy. Session focused on w/c management particularly adjusting and transferring to personal w/c. PTA set up seat and back cushion, applied anti-tippers, and inspected leg rests. Pt propelled nsg station then PTA transported remaining distance to ortho gym. Performed squat pivot transfer to mat with minA to R. PTA lined up pt's personal w/c and noted that arm rests are a bit lower then chair pt was previously in. PTA raised mat to seat level and pt was able to perform squat pivot to personal chair with minA. PTA then adjusted leg rest and pt was able to place residual limb on pad and appeared to sit appropriately. Pt then propelled w/c towards room with PTA noted that pt with some difficulty maintaining grip therefore back in room PTA placed theraband to w/c rims and had pt trial with improved tolerance. Pt left in w/c at end of session appreciative of being in person chair. Pt left with call bell within reach, wife present in room, and current needs met.      Therapy Documentation Precautions:  Precautions Precautions: Fall Precaution Comments: wound vac L LE Required Braces or Orthoses: Other Brace Other Brace: limb protector L LE, AFO with shoe component to R LE Restrictions Weight Bearing Restrictions: Yes LLE Weight Bearing: Non weight bearing General:   Vital Signs: Therapy Vitals Temp: 97.6 F (36.4 C) Temp Source:  Temporal Pulse Rate: 67 Resp: (P) 17 BP: (!) 149/54 Patient Position (if appropriate): Lying Oxygen Therapy SpO2: 93 % Pain:   Mobility:   Locomotion :    Trunk/Postural Assessment :    Balance:   Exercises:   Other Treatments:      Therapy/Group: Individual Therapy  [name] 05/16/2021, 4:15 PM

## 2021-05-16 NOTE — Progress Notes (Signed)
PROGRESS NOTE   Subjective/Complaints: No new complaints this morning Would like wound vac removed and today is day 7- order for removal placed Team conference today NAC unable to be administered as per pharmacy- this is hospital policy- can be given back to patient for use at home.    ROS:   Pain better controlled, +constipation  Objective:   No results found. Recent Labs    05/14/21 0633  WBC 4.7  HGB 8.7*  HCT 25.6*  PLT 95*   Recent Labs    05/14/21 0633 05/16/21 1009  NA 136 138  K 4.3 4.7  CL 96* 99  CO2 24 24  GLUCOSE 149* 175*  BUN 89* 70*  CREATININE 10.60* 8.51*  CALCIUM 8.3* 8.8*     Intake/Output Summary (Last 24 hours) at 05/16/2021 1333 Last data filed at 05/15/2021 1832 Gross per 24 hour  Intake 240 ml  Output 100 ml  Net 140 ml        Physical Exam: Vital Signs Blood pressure (!) 129/54, pulse 64, temperature 97.6 F (36.4 C), temperature source Oral, resp. rate 14, height 5\' 11"  (1.803 m), weight 94.2 kg, SpO2 93 %. Gen: no distress, normal appearing HEENT: oral mucosa pink and moist, NCAT Cardio: Reg rate Chest: normal effort, normal rate of breathing Abd: soft, non-distended Ext: no edema  Psychiatric: appropriate but irritable;  Neurological: Ox3  Skin:    Comments: Left BKA dressed appropriately tender  Neurological:     Comments: Patient is alert.  Makes eye contact with examiner.  Oriented x3   Assessment/Plan: 1. Functional deficits which require 3+ hours per day of interdisciplinary therapy in a comprehensive inpatient rehab setting. Physiatrist is providing close team supervision and 24 hour management of active medical problems listed below. Physiatrist and rehab team continue to assess barriers to discharge/monitor patient progress toward functional and medical goals  Care Tool:  Bathing    Body parts bathed by patient: Left arm, Right arm, Chest, Abdomen,  Front perineal area, Face, Right lower leg, Right upper leg   Body parts bathed by helper: Buttocks Body parts n/a: Left upper leg, Left lower leg   Bathing assist Assist Level: Minimal Assistance - Patient > 75%     Upper Body Dressing/Undressing Upper body dressing   What is the patient wearing?: Pull over shirt    Upper body assist Assist Level: Independent with assistive device    Lower Body Dressing/Undressing Lower body dressing      What is the patient wearing?: Underwear/pull up, Pants     Lower body assist Assist for lower body dressing: Minimal Assistance - Patient > 75% (A due to wound vac)     Toileting Toileting Toileting Activity did not occur (Clothing management and hygiene only): N/A (no void or bm)  Toileting assist Assist for toileting: Minimal Assistance - Patient > 75%     Transfers Chair/bed transfer  Transfers assist     Chair/bed transfer assist level: Moderate Assistance - Patient 50 - 74% (squat<>pivot to L)     Locomotion Ambulation   Ambulation assist   Ambulation activity did not occur: Safety/medical concerns (Poor motor planning, LLE NWB, Orthotic shoe on  RLE, pain)          Walk 10 feet activity   Assist  Walk 10 feet activity did not occur: Safety/medical concerns (Poor motor planning, LLE NWB, Orthotic shoe on RLE, pain)        Walk 50 feet activity   Assist Walk 50 feet with 2 turns activity did not occur: Safety/medical concerns (Poor motor planning, LLE NWB, Orthotic shoe on RLE, pain)         Walk 150 feet activity   Assist Walk 150 feet activity did not occur: Safety/medical concerns (Poor motor planning, LLE NWB, Orthotic shoe on RLE, pain)         Walk 10 feet on uneven surface  activity   Assist Walk 10 feet on uneven surfaces activity did not occur: Safety/medical concerns (Poor motor planning, LLE NWB, Orthotic shoe on RLE, pain)         Wheelchair     Assist Is the patient using a  wheelchair?: Yes Type of Wheelchair: Manual    Wheelchair assist level: Set up assist, Supervision/Verbal cueing Max wheelchair distance: >150'    Wheelchair 50 feet with 2 turns activity    Assist        Assist Level: Supervision/Verbal cueing   Wheelchair 150 feet activity     Assist      Assist Level: Supervision/Verbal cueing   Blood pressure (!) 129/54, pulse 64, temperature 97.6 F (36.4 C), temperature source Oral, resp. rate 14, height 5\' 11"  (1.803 m), weight 94.2 kg, SpO2 93 %. Medical Problem List and Plan: 1. Functional deficits secondary to left BKA 05/09/2021 secondary to peripheral vascular disease poor healing of recent left fourth and fifth ray amputation             -patient may shower but incision must be covered             -ELOS/Goals: 8-12 days S      Continue CIR- PT and OT  Remove wound vac today.  2.  Antithrombotics: -DVT/anticoagulation:  Mechanical: Antiembolism stockings, thigh (TED hose) Right lower extremity             -antiplatelet therapy: N/A 3. Postoperative pain: d/c opioid medications due to sedation. Lyrica 150 mg daily and 300 mg nightly, oxycodone as needed. Schedule Tylenol 1000mg  TID, start 1,000mg  vitamin C daily. LFTs reviewed and stable. 2/6- Pain tolerable with regimen- doesn't want opiates- doing better- con't tylenol and lyrica- might need to change Lyrica dose due to renal /ESRD 4. Mood: Provide emotional support             -antipsychotic agents: N/A 5. Neuropsych: This patient is capable of making decisions on his own behalf. 6. Skin/Wound Care: Routine skin checks 7. Hyponatremia: monitor Na with HD labs.  8.  End-stage renal disease.  Continue hemodialysis as per renal services. 9.  Acute on chronic anemia.  Hgb improving, monitor w/ HD labs.  10.  Hypertension.  Cozaar 100 mg daily, Increase Coreg to 25 mg twice daily.  Monitor with increased mobility 11.  Diabetes mellitus with peripheral neuropathy.  Hemoglobin  A1c 5.5.  NovoLog 2 units 3 times daily. Discussed outpatient Qutenza and he is very interested, will schedule.  12.  Hypothyroidism.  Synthroid 13.  Hyperlipidemia.  Crestor 14.  History of gout.  Continue allopurinol.  Monitor for any gout flareups 15.  History of atrial flutter with ablation 04/06/2020.  Follow-up cardiology services as needed.  Cardiac rate controlled 16.  History  of prostate cancer status post TURP 06/28/2020.  Follow-up outpatient 17.  Obesity.  BMI 29.99.  Dietary follow-up 18.  GERD.  Protonix 19. Sedation: hold opioid medications 20. Vitamin D deficiency: start ergocalciferol 50,000U once per week for 7 weeks. 21. Constipation: continue colace to 100mg  TID and add Senna 2 tabs HS. Discussed increasing bowel regimen but he defers at this time 22. Liver protection while on Tylenol: discussed benefits of NAC-will have to give outpatient as hospital does not allow supplements not approved by FDA, discussed with pharmacy     LOS: 5 days A FACE TO FACE EVALUATION WAS PERFORMED  Martha Clan P Amelya Mabry 05/16/2021, 1:33 PM

## 2021-05-16 NOTE — Progress Notes (Signed)
Team Conference Report to Patient/Family ° °Team Conference discussion was reviewed with the patient and caregiver, including goals, any changes in plan of care and target discharge date.  Patient and caregiver express understanding and are in agreement.  The patient has a target discharge date of 05/23/21. ° °SW met with patient and spouse, provided team conference updates. Patient will prefer to have a TTB and Transfer Board for D/c. Family education scheduled with patient spouse on 2/13 9-12 °Christina J [place] °05/16/2021, 2:01 PM  °

## 2021-05-17 LAB — GLUCOSE, CAPILLARY
Glucose-Capillary: 103 mg/dL — ABNORMAL HIGH (ref 70–99)
Glucose-Capillary: 95 mg/dL (ref 70–99)

## 2021-05-17 NOTE — Progress Notes (Signed)
Patient ID: [name], male   DOB: September 29, 1950, 71 y.o.   MRN: [id number]  [facility] resource list provided to patient

## 2021-05-17 NOTE — Progress Notes (Signed)
Occupational Therapy Session Note  Patient Details  Name: [name] MRN: [id number] Date of Birth: Apr 18, 1950  Today's Date: 05/17/2021 OT Individual Time: 0800-0908 OT Individual Time Calculation (min): 68 min    Short Term Goals: Week 1:  OT Short Term Goal 1 (Week 1): Pt will complete 1/3 components of toileting with CGA OT Short Term Goal 2 (Week 1): Pt will complete LB dressing at sit<stand level with 1 assist OT Short Term Goal 3 (Week 1): Pt will complete UB self care EOB with no more than supervision assist for sitting balance  Skilled Therapeutic Interventions/Progress Updates:    Pt completed shower and dressing during session.  Therapist covered his residual limb and then he completed mod assist level squat pivot transfer  from the wheelchair to the tub bench on the right side.  He then needed total assist for removal of his right shoe with AFO and sock.  He was able to remove his shirt with supervision as well as his underpants and pants with supervision and lateral leans side to side on the bench.  He was able to complete all bathing with supervision as well and lateral leans for rinsing and washing his buttocks.  Max assist was needed for squat pivot out of the shower to the wheelchair on the left side.  He was able to donn his underpants and pants over his feet with min assist.  Therapist assisted with donning right sock, shoe, and AFO in preparation to try and stand and pull items over his hips.  With attempted stand, he was not able to complete with use of the walker.  Before re-attempting he voiced the need to toilet so transfer to the 3:1 over the toilet was completed with use of the [facility] after application of the limb guard on the LLE.  He was able to complete sit to stand in the [place] with min assist for transfer.  Max assist was needed for clothing management but he was able to perform toilet hygiene in sitting with lateral leans and supervision.  Finished session with pt  donning pullover shirt with supervision.  Call button and phone in reach and safety alarm belt in place.      Therapy Documentation Precautions:  Precautions Precautions: Fall Precaution Comments: wound vac L LE Required Braces or Orthoses: Other Brace Other Brace: limb protector L LE, AFO with shoe component to R LE Restrictions Weight Bearing Restrictions: Yes LLE Weight Bearing: Non weight bearing  Pain: Pain Assessment Pain Scale: Faces Faces Pain Scale: Hurts little more Pain Type: Surgical pain Pain Location: Leg Pain Orientation: Left Pain Descriptors / Indicators: Discomfort Pain Onset: With Activity Pain Intervention(s): Repositioned Multiple Pain Sites: No ADL: See Care Tool Section for some details of mobility and selfcare   Therapy/Group: Individual Therapy  Samie Barclift OTR/L 05/17/2021, 12:52 PM

## 2021-05-17 NOTE — Progress Notes (Signed)
PROGRESS NOTE   Subjective/Complaints: Complaints about renal diet and 1288mL fluid restriction as he says he has never needed a renal diet in the past. He would like his primary nephrologist consulted. Discussed that we can transition diet back to regular diet.    ROS:   Pain better controlled, +constipation, +right foot neuropathy  Objective:   No results found. No results for input(s): WBC, HGB, HCT, PLT in the last 72 hours.  Recent Labs    05/16/21 1009  NA 138  K 4.7  CL 99  CO2 24  GLUCOSE 175*  BUN 70*  CREATININE 8.51*  CALCIUM 8.8*     Intake/Output Summary (Last 24 hours) at 05/17/2021 1657 Last data filed at 05/17/2021 1315 Gross per 24 hour  Intake 480 ml  Output --  Net 480 ml        Physical Exam: Vital Signs Blood pressure 130/61, pulse 73, temperature 98.5 F (36.9 C), resp. rate 16, height 5\' 11"  (1.803 m), weight 98 kg, SpO2 100 %. Gen: no distress, normal appearing HEENT: oral mucosa pink and moist, NCAT Cardio: Reg rate Chest: normal effort, normal rate of breathing Abd: soft, non-distended Ext: no edema Psych: pleasant, normal affect  Skin:    Comments: Left BKA dressed appropriately tender  Neurological:     Comments: Patient is alert.  Makes eye contact with examiner.  Oriented x3   Assessment/Plan: 1. Functional deficits which require 3+ hours per day of interdisciplinary therapy in a comprehensive inpatient rehab setting. Physiatrist is providing close team supervision and 24 hour management of active medical problems listed below. Physiatrist and rehab team continue to assess barriers to discharge/monitor patient progress toward functional and medical goals  Care Tool:  Bathing    Body parts bathed by patient: Left arm, Right arm, Chest, Abdomen, Front perineal area, Face, Right lower leg, Right upper leg, Buttocks, Left upper leg   Body parts bathed by helper:  Buttocks Body parts n/a: Left lower leg   Bathing assist Assist Level: Contact Guard/Touching assist (sitting on shower bench)     Upper Body Dressing/Undressing Upper body dressing   What is the patient wearing?: Pull over shirt    Upper body assist Assist Level: Set up assist    Lower Body Dressing/Undressing Lower body dressing      What is the patient wearing?: Underwear/pull up, Pants     Lower body assist Assist for lower body dressing: Maximal Assistance - Patient 25 - 49% (sit to stand in the [place])     [employer] Activity did not occur Landscape architect and hygiene only): N/A (no void or bm)  Toileting assist Assist for toileting: Maximal Assistance - Patient 25 - 49% (sit to stand in the [place])     Transfers Chair/bed transfer  Transfers assist     Chair/bed transfer assist level: Minimal Assistance - Patient > 75%     Locomotion Ambulation   Ambulation assist   Ambulation activity did not occur: Safety/medical concerns (Poor motor planning, LLE NWB, Orthotic shoe on RLE, pain)          Walk 10 feet activity   Assist  Walk 10 feet activity  did not occur: Safety/medical concerns (Poor motor planning, LLE NWB, Orthotic shoe on RLE, pain)        Walk 50 feet activity   Assist Walk 50 feet with 2 turns activity did not occur: Safety/medical concerns (Poor motor planning, LLE NWB, Orthotic shoe on RLE, pain)         Walk 150 feet activity   Assist Walk 150 feet activity did not occur: Safety/medical concerns (Poor motor planning, LLE NWB, Orthotic shoe on RLE, pain)         Walk 10 feet on uneven surface  activity   Assist Walk 10 feet on uneven surfaces activity did not occur: Safety/medical concerns (Poor motor planning, LLE NWB, Orthotic shoe on RLE, pain)         Wheelchair     Assist Is the patient using a wheelchair?: Yes Type of Wheelchair: Manual    Wheelchair assist level: Set up  assist, Supervision/Verbal cueing Max wheelchair distance: >150'    Wheelchair 50 feet with 2 turns activity    Assist        Assist Level: Supervision/Verbal cueing   Wheelchair 150 feet activity     Assist      Assist Level: Supervision/Verbal cueing   Blood pressure 130/61, pulse 73, temperature 98.5 F (36.9 C), resp. rate 16, height 5\' 11"  (1.803 m), weight 98 kg, SpO2 100 %. Medical Problem List and Plan: 1. Functional deficits secondary to left BKA 05/09/2021 secondary to peripheral vascular disease poor healing of recent left fourth and fifth ray amputation             -patient may shower but incision must be covered             -ELOS/Goals: 8-12 days S      Continue CIR- PT and OT  Wound vac removed  2.  Antithrombotics: -DVT/anticoagulation:  Mechanical: Antiembolism stockings, thigh (TED hose) Right lower extremity             -antiplatelet therapy: N/A 3. Postoperative pain: d/c opioid medications due to sedation. Lyrica 150 mg daily and 300 mg nightly, oxycodone as needed. Schedule Tylenol 1000mg  TID, start 1,000mg  vitamin C daily. LFTs reviewed and stable. 2/6- Pain tolerable with regimen- doesn't want opiates- doing better- con't tylenol and lyrica- might need to change Lyrica dose due to renal /ESRD 4. Mood: Provide emotional support             -antipsychotic agents: N/A 5. Neuropsych: This patient is capable of making decisions on his own behalf. 6. Skin/Wound Care: Routine skin checks 7. Hyponatremia: monitor Na with HD labs.  8.  End-stage renal disease.  Continue hemodialysis as per renal services. Diet changed to regular as per patient request.  9.  Acute on chronic anemia.  Hgb improving, monitor w/ HD labs.  10.  Hypertension.  Continue Cozaar 100 mg daily, Increase Coreg to 25 mg twice daily.  Monitor with increased mobility 11.  Diabetes mellitus with peripheral neuropathy.  Hemoglobin A1c 5.5.  NovoLog 2 units 3 times daily. Discussed outpatient  [facility] and he is very interested, will schedule.  12.  Hypothyroidism.  Continue Synthroid 13.  Hyperlipidemia.  Crestor 14.  History of gout.  Continue allopurinol.  Monitor for any gout flareups 15.  History of atrial flutter with ablation 04/06/2020.  Follow-up cardiology services as needed.  Cardiac rate controlled 16.  History of prostate cancer status post TURP 06/28/2020.  Follow-up outpatient 17.  Obesity.  BMI 29.99.  Dietary follow-up 18.  GERD.  Protonix 19. Sedation: hold opioid medications 20. Vitamin D deficiency: start ergocalciferol 50,000U once per week for 7 weeks. 21. Constipation: continue colace to 100mg  TID and add Senna 2 tabs HS. Discussed increasing bowel regimen but he defers at this time 22. Liver protection while on Tylenol: discussed benefits of NAC-will have to give outpatient as hospital does not allow supplements not approved by FDA, discussed with pharmacy     LOS: 6 days A FACE TO FACE EVALUATION WAS PERFORMED  [name] 05/17/2021, 4:57 PM

## 2021-05-17 NOTE — Progress Notes (Signed)
Patient ID: [name], male   DOB: December 13, 1950, 71 y.o.   MRN: [id number]  Ambulance person ordered through [facility]

## 2021-05-17 NOTE — Progress Notes (Signed)
Physical Therapy Session Note  Patient Details  Name: [name] MRN: [id number] Date of Birth: 07-02-1950  Today's Date: 05/17/2021 PT Individual Time: 1400-1438 PT Individual Time Calculation (min): 38 min   Short Term Goals: Week 1:  PT Short Term Goal 1 (Week 1): Pt will perform sit <>stand w/LRAD and mod A PT Short Term Goal 2 (Week 1): Pt will perform bed <>chair transfer w/LRAD and mod A PT Short Term Goal 3 (Week 1): Pt will initiate gait training w/mod A and LRAD  Skilled Therapeutic Interventions/Progress Updates:   Received pt sitting in personal WC, pt agreeable to PT treatment, and reported pain 7/10 in L residual limb. RN notified and present to administer pain medication and provided repositioning, rest breaks and distraction to reduce pain levels. Session with emphasis on functional mobility/transfers, generalized strengthening, dynamic standing balance, and improved endurance with activity. Pt transported to/from in [facility] dependently for time management purposes. Pt transferred on/off mat via squat<>pivot with min A and performed the following exercises with supervision and verbal cues for technique: -seated trunk rotation x10 bilaterally with 4.4lb medicine ball -tricep extensions on yoga blocks x10 reps -horizontal chest press with 4.4lb medicine ball x10 -overhead chest press with 4.4lb medicine ball x10 Sit<>stand with RW and CGA x 3 trials and performed the following exercises with UE support on RW:  -L hip flexion x10  -L hip abduction x10 -alternating UE lifts from RW x 5 bilaterally - min A for balance Of note, pt more limited by pain this afternoon reporting feeling rough since the wound vac was removed. Concluded session with pt sitting in personal WC with all needs within reach. Placed pillow underneath LLE for comfort.   Therapy Documentation Precautions:  Precautions Precautions: Fall Precaution Comments: wound vac L LE Required Braces or Orthoses: Other  Brace Other Brace: limb protector L LE, AFO with shoe component to R LE Restrictions Weight Bearing Restrictions: Yes LLE Weight Bearing: Non weight bearing  Therapy/Group: Individual Therapy Alfonse Alpers PT, DPT   05/17/2021, 7:39 AM

## 2021-05-17 NOTE — Progress Notes (Signed)
Occupational Therapy Session Note  Patient Details  Name: [name] MRN: [id number] Date of Birth: [birth year]  Today's Date: 05/17/2021 OT Individual Time: 1103-1200 session 1  OT Individual Time Calculation (min): 57 min  Session 2: 1303-1330 27 mins   Short Term Goals: Week 1:  OT Short Term Goal 1 (Week 1): Pt will complete 1/3 components of toileting with CGA OT Short Term Goal 2 (Week 1): Pt will complete LB dressing at sit<stand level with 1 assist OT Short Term Goal 3 (Week 1): Pt will complete UB self care EOB with no more than supervision assist for sitting balance  Skilled Therapeutic Interventions/Progress Updates:  Session 1: Pt greeted  seated in w/c agreeable to OT intervention. Session focus on BADL reeducation, functional mobility, w/c mobility,and decreasing overall caregiver burden. Pt completed squat pivot transfer from old w/c>personal w/c with MIN A. Pt completed w/c mobility from room to gym with supervision. Worked on blocked practice with squat pivot transfers from w/c<>EOM with MIN A. Practiced directing level of care with pt practicing how to direct OTA during transfer.  Discussed toileting options as pt reports he doesn't think his w/c will fit into his bathroom. Provided options such as using transport chair, pt reports he may get stedy for home. Pt to get measurements today on doorway. Pt completed w/c propulsion up/down ramp with overall CGA as pt has ramp at home to enter the house, pt reports his wife will likely assist and reports his ramp at home is steeper. Pt completed additional squat pivot transfer to higher surface to EOM with MIN A. Pt completed w/c propulsion back to room with supervision. Donned shrinker with total A at end of session with RN coming after lunch to change dressing.  pt left in w/c with NT present.               Session 2: Pt greeted seated in w/c  agreeable to OT intervention. Session focus on BUE strengthening and endurance to increase  overall activity tolerance for higher level functional mobility tasks. Pt completed therex as indicated below with level 3 theraband:                  X10 bicep curls X10 shoulder horizontal ABD X10 shoulder diagonal pulls X10 shoulder extension  X10 alternating punches Pt reports he prefers to use dumbbells at home, therefore provided education on how therex could be adapted to dumbbells, pt verbalized understanding. pt  left seated in w/c with all needs within reach.      Therapy Documentation Precautions:  Precautions Precautions: Fall Precaution Comments: wound vac L LE Required Braces or Orthoses: Other Brace Other Brace: limb protector L LE, AFO with shoe component to R LE Restrictions Weight Bearing Restrictions: Yes LLE Weight Bearing: Non weight bearing   Pain: unrated pain reported in LLE during both sessions, rest breaks provided as needed.     Therapy/Group: Individual Therapy  Precious Haws 05/17/2021, 12:27 PM

## 2021-05-17 NOTE — Progress Notes (Signed)
Occupational Therapy Session Note  Patient Details  Name: TAJAE RYBICKI MRN: [id number] Date of Birth: 11-17-1950  Today's Date: 05/18/2021 OT Individual Time: 6742-5525 OT Individual Time Calculation (min): 43 min    Short Term Goals: Week 1:  OT Short Term Goal 1 (Week 1): Pt will complete 1/3 components of toileting with CGA OT Short Term Goal 2 (Week 1): Pt will complete LB dressing at sit<stand level with 1 assist OT Short Term Goal 3 (Week 1): Pt will complete UB self care EOB with no more than supervision assist for sitting balance  Skilled Therapeutic Interventions/Progress Updates:    Pt greeted in the w/c and premedicated for pain. ADL needs presently met. He was agreeable to go outdoors to enjoy the sunny day. Pt self propelled his w/c to the atrium, vcs for increasing shoulder extension to help make his propulsions more efficient. He also self propelled the w/c outdoors, negotiating uneven terrain with min cues. While seated, education provided on pressure relief positioning. Pt performed anterior and lateral leans, discussed maintaining position for ~2 minutes at home. W/c push ups completed x5 reps 2 sets. Discussed pts barrier to d/c. He reports that his bathroom at home is not accessible via w/c. His wife is installing grab bars at the entrance of his bathroom. He would have to hop multiple steps to reach his toilet. Pt is very adamant that he doesn't want a BSC for home though OT highly encouraged him to have an open mind about BSC option to minimize his fall risk. He reported wife is coming in for family education on Monday. Pt at this time has not been able to don his shrinker himself. Discussed using adaptive donut ring, pt very interested in trying to use device. When pt was escorted back to the room, OT brought donut ring shrinker donner into the room and demonstrated use. Advised him to ask next OT if he could have hands on practice during their session together. Left him sitting  up with all needs within reach. Tx focus placed on activity tolerance, UB strengthening, d/c planning, and amputee education.   Therapy Documentation Precautions:  Precautions Precautions: Fall Precaution Comments: wound vac L LE Required Braces or Orthoses: Other Brace Other Brace: limb protector L LE, AFO with shoe component to R LE Restrictions Weight Bearing Restrictions: Yes LLE Weight Bearing: Non weight bearing ADL: ADL Grooming: Modified independent Where Assessed-Grooming: Sitting at sink Upper Body Bathing: Supervision/safety Where Assessed-Upper Body Bathing: Edge of bed Lower Body Bathing: Minimal assistance Where Assessed-Lower Body Bathing: Edge of bed Upper Body Dressing: Setup Where Assessed-Upper Body Dressing: Edge of bed Lower Body Dressing: Minimal assistance Where Assessed-Lower Body Dressing: Edge of bed Toileting: Not assessed Toilet Transfer: Not assessed Tub/Shower Transfer: Not assessed   Therapy/Group: Individual Therapy  Skeet Simmer 05/18/2021, 12:37 PM

## 2021-05-18 LAB — RENAL FUNCTION PANEL
Albumin: 3.4 g/dL — ABNORMAL LOW (ref 3.5–5.0)
Anion gap: 16 — ABNORMAL HIGH (ref 5–15)
BUN: 76 mg/dL — ABNORMAL HIGH (ref 8–23)
CO2: 22 mmol/L (ref 22–32)
Calcium: 9 mg/dL (ref 8.9–10.3)
Chloride: 100 mmol/L (ref 98–111)
Creatinine, Ser: 8.56 mg/dL — ABNORMAL HIGH (ref 0.61–1.24)
GFR, Estimated: 6 mL/min — ABNORMAL LOW (ref >60)
Glucose, Bld: 158 mg/dL — ABNORMAL HIGH (ref 70–99)
Phosphorus: 5.3 mg/dL — ABNORMAL HIGH (ref 2.5–4.6)
Potassium: 4.5 mmol/L (ref 3.5–5.1)
Sodium: 138 mmol/L (ref 135–145)

## 2021-05-18 LAB — CBC
HCT: 25.1 % — ABNORMAL LOW (ref 39.0–52.0)
Hemoglobin: 8.3 g/dL — ABNORMAL LOW (ref 13.0–17.0)
MCH: 30.1 pg (ref 26.0–34.0)
MCHC: 33.1 g/dL (ref 30.0–36.0)
MCV: 90.9 fL (ref 80.0–100.0)
Platelets: 144 10*3/uL — ABNORMAL LOW (ref 150–400)
RBC: 2.76 MIL/uL — ABNORMAL LOW (ref 4.22–5.81)
RDW: 15.7 % — ABNORMAL HIGH (ref 11.5–15.5)
WBC: 5.3 10*3/uL (ref 4.0–10.5)
nRBC: 0 % (ref 0.0–0.2)

## 2021-05-18 LAB — GLUCOSE, CAPILLARY
Glucose-Capillary: 102 mg/dL — ABNORMAL HIGH (ref 70–99)
Glucose-Capillary: 164 mg/dL — ABNORMAL HIGH (ref 70–99)
Glucose-Capillary: 144 mg/dL — ABNORMAL HIGH (ref 70–99)

## 2021-05-18 MED ORDER — TRAMADOL HCL 50 MG PO TABS
50.0000 mg | ORAL_TABLET | Freq: Four times a day (QID) | ORAL | Status: DC | PRN
Start: 2021-05-18 — End: 2021-05-21
  Administered 2021-05-18 – 2021-05-21 (×5): 50 mg via ORAL
  Filled 2021-05-18 (×7): qty 1

## 2021-05-18 MED ORDER — AMITRIPTYLINE HCL 10 MG PO TABS
10.0000 mg | ORAL_TABLET | Freq: Every day | ORAL | Status: DC
  Administered 2021-05-18 – 2021-05-22 (×5): 10 mg via ORAL
  Filled 2021-05-18 (×5): qty 1

## 2021-05-18 NOTE — Procedures (Signed)
° °  I was present at this dialysis session, have reviewed the session itself and made  appropriate changes Kelly Splinter MD [place] pager [phone]   05/18/2021, 5:23 PM

## 2021-05-18 NOTE — Progress Notes (Signed)
Patient agitated earlier, wants med's given exactly on the hour that it is due. Nurse was in room less than 30 min prior to 2000. Patients wife was present. Patient reported to have no pain and made sure to remind nurse of his scheduled tylenol. When nurse went in to give his meds at 2016 patient said his pain level was 5 on his LLE. Wife was gone and pts. son was now present. He said his dad is impatient and likes things on time. Nurse apologized to him but also explained his medication was not late it was only 16 minutes after his scheduled time. Son asked if I could speak to the doctor tomorrow to try and get an order for something to help patient sleep.

## 2021-05-18 NOTE — Progress Notes (Signed)
Occupational Therapy Session Note  Patient Details  Name: [name] MRN: [id number] Date of Birth: 11/30/1950  Today's Date: 05/18/2021 OT Individual Time: 0223-3612 OT Individual Time Calculation (min): 30 min    Short Term Goals: Week 1:  OT Short Term Goal 1 (Week 1): Pt will complete 1/3 components of toileting with CGA OT Short Term Goal 2 (Week 1): Pt will complete LB dressing at sit<stand level with 1 assist OT Short Term Goal 3 (Week 1): Pt will complete UB self care EOB with no more than supervision assist for sitting balance  Skilled Therapeutic Interventions/Progress Updates:    Pt resting in w/c with RN present administering medications. OT intervention with focus on w/c mobility and BUE therex for conditioning and endurance. W/c mobility with supervision to gym. Chest presses with 5# bar 3x10. Rowing with 5# bar 3x10. Ball taps with 3# bar 3x10. Pt handed off to PT in gym.  Therapy Documentation Precautions:  Precautions Precautions: Fall Precaution Comments: wound vac L LE Required Braces or Orthoses: Other Brace Other Brace: limb protector L LE, AFO with shoe component to R LE Restrictions Weight Bearing Restrictions: Yes LLE Weight Bearing: Non weight bearing   Pain: Pain Assessment Pain Scale: 0-10 Pain Score: 7  Pain Type: Surgical pain Pain Location: Leg Pain Orientation: Left Pain Descriptors / Indicators: Aching;Spasm Pain Frequency: Intermittent Pain Onset: On-going Patients Stated Pain Goal: 2 Pain Intervention(s): premedicated  Therapy/Group: Individual Therapy  Leroy Libman 05/18/2021, 11:51 AM

## 2021-05-18 NOTE — Progress Notes (Signed)
Patient called nurses station requesting medication to help him sleep. Nurse explained he did not have any orders for it. Nurse will inform doctor of is request. Nurse suggested patient let her dim lights or turn some off so he can sleep without so much bright light in the room (patient has every light on in room). He refused and said, " No that will just make it worst". Nurse followed his wishes and left the lights on at his request

## 2021-05-18 NOTE — Progress Notes (Addendum)
PROGRESS NOTE   Subjective/Complaints: He is happy about regular diet Unable to sleep last night due to pain- discussed starting amitriptyline 10mg  HS and he is agreeable Cr 8.51 yesterday  ROS:  +constipation, +right foot neuropathy, +residual limb pain  Objective:   No results found. No results for input(s): WBC, HGB, HCT, PLT in the last 72 hours.  Recent Labs    05/16/21 1009  NA 138  K 4.7  CL 99  CO2 24  GLUCOSE 175*  BUN 70*  CREATININE 8.51*  CALCIUM 8.8*     Intake/Output Summary (Last 24 hours) at 05/18/2021 1111 Last data filed at 05/18/2021 8938 Gross per 24 hour  Intake 958 ml  Output 325 ml  Net 633 ml        Physical Exam: Vital Signs Blood pressure 122/60, pulse 72, temperature 98.9 F (37.2 C), temperature source Oral, resp. rate 18, height 5\' 11"  (1.803 m), weight 93.6 kg, SpO2 100 %. Gen: no distress, normal appearing HEENT: oral mucosa pink and moist, NCAT Cardio: Reg rate Chest: normal effort, normal rate of breathing Abd: soft, non-distended Ext: no edema Psych: pleasant, normal affect  Skin:    Comments: Left BKA dressed appropriately tender. +erythema. Limb guard in place.  Neurological:     Comments: Patient is alert.  Makes eye contact with examiner.  Oriented x3   Assessment/Plan: 1. Functional deficits which require 3+ hours per day of interdisciplinary therapy in a comprehensive inpatient rehab setting. Physiatrist is providing close team supervision and 24 hour management of active medical problems listed below. Physiatrist and rehab team continue to assess barriers to discharge/monitor patient progress toward functional and medical goals  Care Tool:  Bathing    Body parts bathed by patient: Left arm, Right arm, Chest, Abdomen, Front perineal area, Face, Right lower leg, Right upper leg, Buttocks, Left upper leg   Body parts bathed by helper: Buttocks Body parts  n/a: Left lower leg   Bathing assist Assist Level: Contact Guard/Touching assist (sitting on shower bench)     Upper Body Dressing/Undressing Upper body dressing   What is the patient wearing?: Pull over shirt    Upper body assist Assist Level: Set up assist    Lower Body Dressing/Undressing Lower body dressing      What is the patient wearing?: Underwear/pull up, Pants     Lower body assist Assist for lower body dressing: Maximal Assistance - Patient 25 - 49% (sit to stand in the [place])     [employer] Activity did not occur Landscape architect and hygiene only): N/A (no void or bm)  Toileting assist Assist for toileting: Maximal Assistance - Patient 25 - 49% (sit to stand in the [place])     Transfers Chair/bed transfer  Transfers assist     Chair/bed transfer assist level: Minimal Assistance - Patient > 75%     Locomotion Ambulation   Ambulation assist   Ambulation activity did not occur: Safety/medical concerns (Poor motor planning, LLE NWB, Orthotic shoe on RLE, pain)          Walk 10 feet activity   Assist  Walk 10 feet activity did not occur: Safety/medical concerns (Poor  motor planning, LLE NWB, Orthotic shoe on RLE, pain)        Walk 50 feet activity   Assist Walk 50 feet with 2 turns activity did not occur: Safety/medical concerns (Poor motor planning, LLE NWB, Orthotic shoe on RLE, pain)         Walk 150 feet activity   Assist Walk 150 feet activity did not occur: Safety/medical concerns (Poor motor planning, LLE NWB, Orthotic shoe on RLE, pain)         Walk 10 feet on uneven surface  activity   Assist Walk 10 feet on uneven surfaces activity did not occur: Safety/medical concerns (Poor motor planning, LLE NWB, Orthotic shoe on RLE, pain)         Wheelchair     Assist Is the patient using a wheelchair?: Yes Type of Wheelchair: Manual    Wheelchair assist level: Supervision/Verbal cueing Max  wheelchair distance: 162ft    Wheelchair 50 feet with 2 turns activity    Assist        Assist Level: Supervision/Verbal cueing   Wheelchair 150 feet activity     Assist      Assist Level: Supervision/Verbal cueing   Blood pressure 122/60, pulse 72, temperature 98.9 F (37.2 C), temperature source Oral, resp. rate 18, height 5\' 11"  (1.803 m), weight 93.6 kg, SpO2 100 %. Medical Problem List and Plan: 1. Functional deficits secondary to left BKA 05/09/2021 secondary to peripheral vascular disease poor healing of recent left fourth and fifth ray amputation             -patient may shower but incision must be covered             -ELOS/Goals: 8-12 days S      Continue CIR- PT and OT  Messaged April to schedule hospital follow-up  Wound vac removed   Have asked Dr. Jess Barters team to take a look given increased pain and erythema 2.  Antithrombotics: -DVT/anticoagulation:  Mechanical: Antiembolism stockings, thigh (TED hose) Right lower extremity             -antiplatelet therapy: N/A 3. Postoperative pain: d/c opioid medications due to sedation. Lyrica 150 mg daily and 300 mg nightly, oxycodone as needed. Schedule Tylenol 1000mg  TID, start 1,000mg  vitamin C daily. LFTs reviewed and stable. Add Amitriptyline 10mg  HS. Tramadol 50mg  q6H prn ordered. 4. Mood: Provide emotional support             -antipsychotic agents: N/A 5. Neuropsych: This patient is capable of making decisions on his own behalf. 6. Skin/Wound Care: Routine skin checks 7. Hyponatremia: monitor Na with HD labs.  8.  End-stage renal disease.  Continue hemodialysis as per renal services. Diet changed to regular as per patient request.  9.  Acute on chronic anemia.  Hgb improving, monitor w/ HD labs.  10.  Hypertension.  Continue Cozaar 100 mg daily, Increase Coreg to 25 mg twice daily.  Monitor with increased mobility 11.  Diabetes mellitus with peripheral neuropathy.  Hemoglobin A1c 5.5.  NovoLog 2 units 3 times  daily. Discussed outpatient Qutenza and he is very interested, messaged April to schedule this.  12.  Hypothyroidism.  Continue Synthroid 13.  Hyperlipidemia.  Crestor 14.  History of gout.  Continue allopurinol.  Monitor for any gout flareups 15.  History of atrial flutter with ablation 04/06/2020.  Follow-up cardiology services as needed.  Cardiac rate controlled 16.  History of prostate cancer status post TURP 06/28/2020.  Follow-up outpatient 17.  Obesity.  BMI 29.99.  Dietary follow-up 18.  GERD.  Protonix 19. Sedation: hold opioid medications 20. Vitamin D deficiency: start ergocalciferol 50,000U once per week for 7 weeks. 21. Constipation: continue colace to 100mg  TID and add Senna 2 tabs HS. Discussed increasing bowel regimen but he defers at this time 22. Liver protection while on Tylenol: discussed benefits of NAC-will have to give outpatient as hospital does not allow supplements not approved by FDA, discussed with pharmacy     LOS: 7 days A FACE TO FACE EVALUATION WAS PERFORMED  [name] [name] 05/18/2021, 11:11 AM

## 2021-05-18 NOTE — Progress Notes (Signed)
Nurse checked on patient he was sound asleep resting in his bed.

## 2021-05-18 NOTE — Progress Notes (Signed)
Patient did wake up frequently through the night. He would most likely benefit from medication to help with insomnia. Patient also having muscle spasms and phantom pain through out the night. No PRNs available. Patient said it just hurts while he his actively having the spasm. He would like to know if he can have a PRN to assist with the pain through out the night.

## 2021-05-18 NOTE — Plan of Care (Signed)
°  Problem: RH Ambulation Goal: LTG Patient will ambulate in home environment (PT) Description: LTG: Patient will ambulate in home environment, # of feet with assistance (PT). Outcome: Not Applicable Flowsheets (Taken 05/18/2021 0746) LTG: Pt will ambulate in home environ  assist needed:: (D/C) -- Note: D/C

## 2021-05-18 NOTE — Progress Notes (Signed)
Physical Therapy Session Note  Patient Details  Name: [name] MRN: [id number] Date of Birth: Jun 10, 1950  Today's Date: 05/18/2021 PT Individual Time: 1093-2355 PT Individual Time Calculation (min): 55 min   Short Term Goals: Week 1:  PT Short Term Goal 1 (Week 1): Pt will perform sit <>stand w/LRAD and mod A PT Short Term Goal 2 (Week 1): Pt will perform bed <>chair transfer w/LRAD and mod A PT Short Term Goal 3 (Week 1): Pt will initiate gait training w/mod A and LRAD  Skilled Therapeutic Interventions/Progress Updates:   Received pt sitting in WC in therapy gym; handoff from OT. Pt agreeable to PT treatment and reported pain 5/10 in L residual limb (premedicated) but frequently experiencing phantom pain spasms throughout session - repositioning, rest breaks, and distraction done to reduce pain and encouraged rubbing/massage for desensitization. Session with emphasis on functional mobility/transfers, generalized strengthening/ROM, dynamic standing balance/coordination, and improved activity tolerance. Squat<>pivot on/off mat with min A and sit<>stand with RW and CGA/min A x 3 trials. Worked on dynamic standing balance tossing horseshoes with RUE x 3 trials with min A for balance. Noted pt with poor eccentric control when sitting, frequently "plopping" down as well as significant R ankle inversion/supination/PF contracture despite wearing AFO. Sit<>semi-reclined on wedge with supervision and performed the following exercises with emphasis on strength/ROM: -LLE SLR 2x10 -L hip abduction 2x10 -RLE SAQ with 4lb ankle weight 2x12 -bench press with 15lb dowel 2x10 -R sidelying L hip abduction 2x10 Pt transferred R sidelying<>prone with supervision but with difficulty and stretched hip flexors for ~5 minutes. While stretching, performed R hamstring curls 2x10 on RLE. Educated pt on importance of avoiding hip flexion contractures for future prosthetic use. Pt transferred prone<>sitting EOB with  supervision and increased time. Pt performed WC mobility 169ft using BUE and supervision back to room. Concluded session with pt sitting in [facility] with all needs within reach, awaiting upcoming OT session.   Therapy Documentation Precautions:  Precautions Precautions: Fall Precaution Comments: wound vac L LE Required Braces or Orthoses: Other Brace Other Brace: limb protector L LE, AFO with shoe component to R LE Restrictions Weight Bearing Restrictions: Yes LLE Weight Bearing: Non weight bearing  Therapy/Group: Individual Therapy Alfonse Alpers PT, DPT   05/18/2021, 7:13 AM

## 2021-05-18 NOTE — Progress Notes (Signed)
Occupational Therapy Session Note  Patient Details  Name: [name] MRN: [id number] Date of Birth: December 08, 1950  Today's Date: 05/18/2021 OT Individual Time: 7322-0254 OT Individual Time Calculation (min): 68 min    Short Term Goals: Week 1:  OT Short Term Goal 1 (Week 1): Pt will complete 1/3 components of toileting with CGA OT Short Term Goal 2 (Week 1): Pt will complete LB dressing at sit<stand level with 1 assist OT Short Term Goal 3 (Week 1): Pt will complete UB self care EOB with no more than supervision assist for sitting balance  Skilled Therapeutic Interventions/Progress Updates:  Skilled OT intervention completed with focus on limb care and donning of shrinker education, BUE strengthening . Pt received seated in w/c, agreeable to session. Pt with noticeable tremors in both hands and new "jumpiness in L leg with nurse aware. Pt practiced doffing limb guard with supervision, with loop technique education provided for efficiency with looping when donning straps. Note- pt had drainage coming from the wound, with therapist removing with total A for time. Pt c/o increased pain with the residual limb with therapist notifying nurse for pt to receive pain meds. Therapist removed saturated bandages from wound, with nurse in room to assess wound. Utilized opportunity to pt use skin inspection mirror to look at the incision for the first time with extensive conversation and education provided during this. Pt able to teach back 3 signs of infection that were educated on in earlier session. Therapist applied non-adhesive pad to incision where staples were to prevent shrinker from getting hooked on the staples. Education provided to pt on how to use shrinker funnel to [employer] with more comfort. Therapist threaded shrinker on funnel for demonstration purposes, however used funnel to [employer] with min A for accuracy at bottom of limb. Educated pt on how to take care of shrinker with washing,  drying and maintaining its integrity. Pt donned limb guard with supervision/cues for efficiency. Seated in the w/c pt completed the following to promote endurance needed for functional tasks:  Bicep flexion x20 Chest presses x20 Overhead presses x20  Cues needed for form and technique. Pt left seated in w/c, with chair alarm on and all needs in reach at end of session.   Therapy Documentation Precautions:  Precautions Precautions: Fall Precaution Comments: wound vac L LE Required Braces or Orthoses: Other Brace Other Brace: limb protector L LE, AFO with shoe component to R LE Restrictions Weight Bearing Restrictions: Yes LLE Weight Bearing: Non weight bearing  Pain: Unrated pain in residual limb, RN made aware   Therapy/Group: Individual Therapy  Lekeisha Arenas E Claus Silvestro 05/18/2021, 7:38 AM

## 2021-05-19 LAB — GLUCOSE, CAPILLARY
Glucose-Capillary: 105 mg/dL — ABNORMAL HIGH (ref 70–99)
Glucose-Capillary: 145 mg/dL — ABNORMAL HIGH (ref 70–99)
Glucose-Capillary: 132 mg/dL — ABNORMAL HIGH (ref 70–99)
Glucose-Capillary: 160 mg/dL — ABNORMAL HIGH (ref 70–99)

## 2021-05-19 MED ORDER — NEPRO/CARBSTEADY PO LIQD
237.0000 mL | Freq: Three times a day (TID) | ORAL | Status: DC
  Administered 2021-05-19 – 2021-05-23 (×10): 237 mL via ORAL

## 2021-05-19 NOTE — Progress Notes (Signed)
PROGRESS NOTE   Subjective/Complaints:  Pt reports leg still hurting a lot- concerned about it.  Slept finally last night- 2nd pain meds were taken that Dr R satrted.   Dr [name] came in while there- doesn't want limb guard to be used to take pressure off end of residual limb and shrinker then dressing on top of it.   LBM yesterday - small- feels a little better ROS:   Pt denies SOB, abd pain, CP, N/V/(+)C/D, and vision changes   Objective:   No results found. Recent Labs    05/18/21 1420  WBC 5.3  HGB 8.3*  HCT 25.1*  PLT 144*    Recent Labs    05/18/21 1420  NA 138  K 4.5  CL 100  CO2 22  GLUCOSE 158*  BUN 76*  CREATININE 8.56*  CALCIUM 9.0     Intake/Output Summary (Last 24 hours) at 05/19/2021 1202 Last data filed at 05/19/2021 0800 Gross per 24 hour  Intake 120 ml  Output 3500 ml  Net -3380 ml        Physical Exam: Vital Signs Blood pressure (!) 109/55, pulse 69, temperature 97.8 F (36.6 C), resp. rate 16, height 5\' 11"  (1.803 m), weight 92.3 kg, SpO2 97 %.    General: awake, alert, appropriate, sitting up in w/c with limb guard in place and shrinker; NAD HENT: conjugate gaze; oropharynx moist CV: regular rate; no JVD Pulmonary: CTA B/L; no W/R/R- good air movement GI: soft, NT, ND, (+)BS Psychiatric: appropriate but irritable Neurological: Ox3 Skin:    Comments: Left BKA dressed appropriately tender. Has shrinker in place- some dried blood- and well as popped blister at tip- some bleeding seen- trace Neurological:     Comments: Patient is alert.  Makes eye contact with examiner.  Oriented x3    Assessment/Plan: 1. Functional deficits which require 3+ hours per day of interdisciplinary therapy in a comprehensive inpatient rehab setting. Physiatrist is providing close team supervision and 24 hour management of active medical problems listed below. Physiatrist and rehab team continue  to assess barriers to discharge/monitor patient progress toward functional and medical goals  Care Tool:  Bathing    Body parts bathed by patient: Left arm, Right arm, Chest, Abdomen, Front perineal area, Face, Right lower leg, Right upper leg, Buttocks, Left upper leg   Body parts bathed by helper: Buttocks Body parts n/a: Left lower leg   Bathing assist Assist Level: Contact Guard/Touching assist (sitting on shower bench)     Upper Body Dressing/Undressing Upper body dressing   What is the patient wearing?: Pull over shirt    Upper body assist Assist Level: Set up assist    Lower Body Dressing/Undressing Lower body dressing      What is the patient wearing?: Ace wrap/stump shrinker     Lower body assist Assist for lower body dressing: Minimal Assistance - Patient > 75%     Toileting Toileting Toileting Activity did not occur (Clothing management and hygiene only): N/A (no void or bm)  Toileting assist Assist for toileting: Maximal Assistance - Patient 25 - 49% (sit to stand in the [place])     Transfers Chair/bed transfer  Transfers  assist     Chair/bed transfer assist level: Minimal Assistance - Patient > 75%     Locomotion Ambulation   Ambulation assist   Ambulation activity did not occur: Safety/medical concerns (Poor motor planning, LLE NWB, Orthotic shoe on RLE, pain)          Walk 10 feet activity   Assist  Walk 10 feet activity did not occur: Safety/medical concerns (Poor motor planning, LLE NWB, Orthotic shoe on RLE, pain)        Walk 50 feet activity   Assist Walk 50 feet with 2 turns activity did not occur: Safety/medical concerns (Poor motor planning, LLE NWB, Orthotic shoe on RLE, pain)         Walk 150 feet activity   Assist Walk 150 feet activity did not occur: Safety/medical concerns (Poor motor planning, LLE NWB, Orthotic shoe on RLE, pain)         Walk 10 feet on uneven surface  activity   Assist Walk 10 feet on  uneven surfaces activity did not occur: Safety/medical concerns (Poor motor planning, LLE NWB, Orthotic shoe on RLE, pain)         Wheelchair     Assist Is the patient using a wheelchair?: Yes Type of Wheelchair: Manual    Wheelchair assist level: Supervision/Verbal cueing Max wheelchair distance: 171ft    Wheelchair 50 feet with 2 turns activity    Assist        Assist Level: Supervision/Verbal cueing   Wheelchair 150 feet activity     Assist      Assist Level: Supervision/Verbal cueing   Blood pressure (!) 109/55, pulse 69, temperature 97.8 F (36.6 C), resp. rate 16, height 5\' 11"  (1.803 m), weight 92.3 kg, SpO2 97 %. Medical Problem List and Plan: 1. Functional deficits secondary to left BKA 05/09/2021 secondary to peripheral vascular disease poor healing of recent left fourth and fifth ray amputation             -patient may shower but incision must be covered             -ELOS/Goals: 8-12 days S       Con't CIR_ PT and OT  Messaged April to schedule hospital follow-up  No limb guard- use shrinker and dressings OVER shrinker, per Dr [name].  2.  Antithrombotics: -DVT/anticoagulation:  Mechanical: Antiembolism stockings, thigh (TED hose) Right lower extremity             -antiplatelet therapy: N/A 3. Postoperative pain: d/c opioid medications due to sedation. Lyrica 150 mg daily and 300 mg nightly, oxycodone as needed. Schedule Tylenol 1000mg  TID, start 1,000mg  vitamin C daily. LFTs reviewed and stable. Add Amitriptyline 10mg  HS. Tramadol 50mg  q6H prn ordered.  2/11- pain better controlled today- will con't current regimen and titrate as required.  4. Mood: Provide emotional support             -antipsychotic agents: N/A 5. Neuropsych: This patient is capable of making decisions on his own behalf. 6. Skin/Wound Care: Routine skin checks 7. Hyponatremia: monitor Na with HD labs.   2/11- Na 138- con't to monitor 8.  End-stage renal disease.  Continue  hemodialysis as per renal services. Diet changed to regular as per patient request.  9.  Acute on chronic anemia.  Hgb improving, monitor w/ HD labs.  10.  Hypertension.  Continue Cozaar 100 mg daily, Increase Coreg to 25 mg twice daily.  Monitor with increased mobility 11.  Diabetes mellitus with peripheral  neuropathy.  Hemoglobin A1c 5.5.  NovoLog 2 units 3 times daily. Discussed outpatient Qutenza and he is very interested, messaged April to schedule this.   2/11- CBGs 95-164- 1 value above 150- con't regimen 12.  Hypothyroidism.  Continue Synthroid 13.  Hyperlipidemia.  Crestor 14.  History of gout.  Continue allopurinol.  Monitor for any gout flareups 15.  History of atrial flutter with ablation 04/06/2020.  Follow-up cardiology services as needed.  Cardiac rate controlled 16.  History of prostate cancer status post TURP 06/28/2020.  Follow-up outpatient 17.  Obesity.  BMI 29.99.  Dietary follow-up 18.  GERD.  Protonix 19. Sedation: hold opioid medications 20. Vitamin D deficiency: start ergocalciferol 50,000U once per week for 7 weeks. 21. Constipation: continue colace to 100mg  TID and add Senna 2 tabs HS. Discussed increasing bowel regimen but he defers at this time 22. Liver protection while on Tylenol: discussed benefits of NAC-will have to give outpatient as hospital does not allow supplements not approved by FDA, discussed with pharmacy 23. L BKA  2/11- will stop limb guard- also start Nepro per Dr [name] BID- since ESRD and DM.   I spent a total of 38   minutes on total care today- >50% coordination of care- due to d/w Dr [name]' nursing and assessing wounds.    LOS: 8 days A FACE TO FACE EVALUATION WAS PERFORMED  Keili Hasten 05/19/2021, 12:02 PM

## 2021-05-19 NOTE — Progress Notes (Signed)
[facility] Progress Note  Subjective: seen in room,   Vitals:   05/18/21 1700 05/18/21 1947 05/19/21 0450 05/19/21 0500  BP: 130/64 (!) 127/47 (!) 109/55   Pulse: 79 65 69   Resp: 15 16 16    Temp: 98.3 F (36.8 C) 98.1 F (36.7 C) 97.8 F (36.6 C)   TempSrc: Oral Oral    SpO2: 100% 100% 97%   Weight:    92.3 kg  Height:        Exam: Gen: Appears comfortable sitting up in bed, NT at bedside CVS: Pulse regular rhythm, normal rate, S1 and S2 normal Resp: Clear to auscultation bilaterally, no rales/rhonchi Abd: Soft, obese, nontender, bowel sounds normal Ext: Status post left below-knee amputation with clean dressing, left brachiocephalic fistula with palpable thrill.      OP HD: NxStage HHD 4x/ week   2K lactate 45 AVF w/ buttonholes  15g    89kg  Hep none  - mircera 100 mcg SQ last given 1/26 > due 2/09   Assessment/ Plan: SP L BKA - for nonhealing diabetic foot ulcer/osteomyelitis, done by Dr. [name] on 05/09/21.  He is currently admitted to the inpatient rehabilitation unit.   ESRD: Usually 4d/week at home, plan MWF schedule here. Next HD Monday.  Hypertension/volume: BP good on Losartan and Coreg. 3kg up today, mild edema on exam. Cont to lower vol w/ next HD.   Anemia: Hgb 8.5- 10 here. Esa due on 2/09 > ordered darbe 100 mcg weekly on Thursday, next 2/09.    Metabolic bone disease: Calcium and phosphorus levels currently at goal. Continue calcitriol and renvela.   Nutrition:  Alb 3.3, on protein supplement.  Diabetes mellitus: on insulin, management per primary team Constipation: ordered 10 mg dulcolax for 6 pm tonight pt request     [name] Fallen 05/19/2021, 12:45 PM   Recent Labs  Lab 05/14/21 0633 05/16/21 1009 05/18/21 1420  K 4.3 4.7 4.5  BUN 89* 70* 76*  CREATININE 10.60* 8.51* 8.56*  ALBUMIN 2.9*  --  3.4*  CALCIUM 8.3* 8.8* 9.0  PHOS  --   --  5.3*    Inpatient medications:  acetaminophen  1,000 mg Oral TID   allopurinol  200 mg  Oral Daily   amitriptyline  10 mg Oral QHS   vitamin C  1,000 mg Oral Daily   bisacodyl  10 mg Rectal Once   calcitRIOL  0.5 mcg Oral Daily   carvedilol  25 mg Oral BID WC   darbepoetin (ARANESP) injection - DIALYSIS  100 mcg Intravenous Q Thu-HD   docusate sodium  100 mg Oral TID   feeding supplement (NEPRO CARB STEADY)  237 mL Oral TID BM   insulin aspart  0-6 Units Subcutaneous TID WC   insulin aspart  2 Units Subcutaneous TID WC   levothyroxine  112 mcg Oral QAC breakfast   losartan  100 mg Oral Daily   nutrition supplement (JUVEN)  1 packet Oral BID BM   pantoprazole  40 mg Oral Daily   polyvinyl alcohol  1-2 drop Both Eyes Daily   pregabalin  150 mg Oral Daily   pregabalin  300 mg Oral QHS   rosuvastatin  10 mg Oral Daily   Semaglutide(0.25 or 0.5MG /DOS)  0.5 mg Subcutaneous Q7 days   senna  2 tablet Oral QHS   sevelamer carbonate  800 mg Oral TID WC   Vitamin D (Ergocalciferol)  50,000 Units Oral Q7 days   zinc sulfate  220 mg  Oral Daily    phenol, polyethylene glycol, potassium chloride, traMADol

## 2021-05-19 NOTE — Progress Notes (Signed)
Occupational Therapy Session Note  Patient Details  Name: [name] MRN: [id number] Date of Birth: 07-Aug-1950  Today's Date: 05/19/2021 OT Individual Time: 1140-1205 OT Individual Time Calculation (min): 25 min    Short Term Goals: Week 1:  OT Short Term Goal 1 (Week 1): Pt will complete 1/3 components of toileting with CGA OT Short Term Goal 2 (Week 1): Pt will complete LB dressing at sit<stand level with 1 assist OT Short Term Goal 3 (Week 1): Pt will complete UB self care EOB with no more than supervision assist for sitting balance  Skilled Therapeutic Interventions/Progress Updates:  Skilled OT intervention completed with focus on contracture prevention with knee extension focus, BUE strengthening, and positioning. Pt received seated in w/c, agreeable to session. Pt discontent about having minimal therapy session today, however pleased with earlier than planned session.   Pt reported that surgeon visited pt today, and was concerned about amount of knee flexion present and pt was upset about this potentially affecting potential for prosthetic. Education provided to pt about contracture prevention, and advised that pt/therapist work on extension daily to assist in loosening the tendons behind the knee.  With therapist providing light facilitation above L knee and under L residual limb, pt completed 2x20 knee extension, with cues needed to bring back of knee to w/c pad vs lifting leg. Removed second pillow to provide more of a passive stretch to back of knee when seated in w/c.  To promote overall endurance and strength needed for functional tasks pt completed the following with 5 pound dowel: Bicep flexion x15 Chest presses x15 Overhead presses x15 Alternating rows/russian twists x20 Tricep row x15 each arm  Pt was left seated in w/c, with chair alarm on and all needs in reach at end of session.    Therapy Documentation Precautions:  Precautions Precautions: Fall Precaution  Comments: wound vac L LE Required Braces or Orthoses: Other Brace Other Brace: limb protector L LE, AFO with shoe component to R LE Restrictions Weight Bearing Restrictions: Yes LLE Weight Bearing: Non weight bearing  Pain: Unrated pain in L residual limb, nurse aware, with therapist assisting in positioning for comfort   Therapy/Group: Individual Therapy  [name] 05/19/2021, 7:50 AM

## 2021-05-19 NOTE — Progress Notes (Addendum)
New blister noted to distal left stump measuring 4.2cm x 6cm. Scant amount of serosanguineous drainage noted.  Dr. Dagoberto Ligas notified and Dr. [name] notified.   Yehuda Mao, LPN

## 2021-05-19 NOTE — Progress Notes (Signed)
Orthopedic Tech Progress Note Patient Details:  [name] May 11, 1950 [id number] 2 Left BK shrinkers have been ordered from [facility]  Patient ID: [name], male   DOB: 1951-01-21, 72 y.o.   MRN: [id number]  [name] 05/19/2021, 11:01 AM

## 2021-05-20 LAB — GLUCOSE, CAPILLARY
Glucose-Capillary: 97 mg/dL (ref 70–99)
Glucose-Capillary: 145 mg/dL — ABNORMAL HIGH (ref 70–99)
Glucose-Capillary: 186 mg/dL — ABNORMAL HIGH (ref 70–99)
Glucose-Capillary: 138 mg/dL — ABNORMAL HIGH (ref 70–99)

## 2021-05-20 NOTE — Progress Notes (Signed)
Occupational Therapy Session Note ° °Patient Details  °Name: [name] °MRN: [id number] °Date of Birth: 01/15/1951 ° °Today's Date: 05/20/2021 °OT Individual Time: 1600-1625 °OT Individual Time Calculation (min): 25 min  ° ° °Short Term Goals: °Week 1:  OT Short Term Goal 1 (Week 1): Pt will complete 1/3 components of toileting with CGA °OT Short Term Goal 2 (Week 1): Pt will complete LB dressing at sit<stand level with 1 assist °OT Short Term Goal 3 (Week 1): Pt will complete UB self care EOB with no more than supervision assist for sitting balance ° ° °Skilled Therapeutic Interventions/Progress Updates:  °  Pt greeted at time of session sitting up in wheelchair with wife present who did not remain for session. Pt with pain in residual limb at posterior aspect of knee, stating Dr Duda told him not to wear limb guard but relayed to the pt that this normally helps with knee extension, however to follow MD recommendations. Overpressure provided to L knee and education on importance of knee extension for future mobility. Pt self propel partially to gym, OT taking over for energy conservation. Focused on UB strength with 5# dowel for bicep curl, chest press, overhead press, FWD circle all for 1x12-20 reps as appropriate with mirror for feedback. Set up in room call bell in reach all needs met.  ° °Therapy Documentation °Precautions:  °Precautions °Precautions: Fall °Precaution Comments: wound vac L LE °Required Braces or Orthoses: Other Brace °Other Brace: limb protector L LE, AFO with shoe component to R LE °Restrictions °Weight Bearing Restrictions: Yes °LLE Weight Bearing: Non weight bearing ° ° ° ° °Therapy/Group: Individual Therapy ° °Hannah C Spach °05/20/2021, 1:14 PM °

## 2021-05-20 NOTE — Progress Notes (Signed)
Initial Nutrition Assessment  DOCUMENTATION CODES:   Not applicable  INTERVENTION:  Continue Nepro Shake po TID, each supplement provides 425 kcal and 19 grams protein  Continue Juven BID, each packet provides 95 calories, 2.5 grams of protein.  Encourage adequate PO intake.   NUTRITION DIAGNOSIS:   Increased nutrient needs related to chronic illness (ESRD HD) as evidenced by estimated needs.  GOAL:   Patient will meet greater than or equal to 90% of their needs  MONITOR:   PO intake, Skin, Supplement acceptance, Weight trends, Labs, I & O's  REASON FOR ASSESSMENT:   Malnutrition Screening Tool    ASSESSMENT:   71 year old right-handed male with history of end-stage renal disease with hemodialysis, diabetes mellitus, gout, prostate cancer with TURP 06/28/2020, hypertension. Presented to 04/27/2021 with pain ulceration deformity left foot status post left fourth and fifth ray amputation 02/02/2021. Limb was not felt to be salvageable underwent left BKA 05/09/2021. Therapy evaluations completed due to patient's decreased functional mobility was admitted to CIR.  Pt unavailable during attempted time of contact. Meal completion has been 25-100% with intake of 75% at breakfast this morning. Pt currently has Nepro and juven ordered and has been consuming them. RD to continue with current orders to aid in caloric and protein needs. Unable to complete Nutrition-Focused physical exam at this time.   Labs and medications reviewed.  Vitamin C 1000 mg once daily Zinc sulfate 220 mg once daily Vitamin D 50,000 once weekly  Diet Order:   Diet Order             Diet regular Room service appropriate? Yes; Fluid consistency: Thin; Fluid restriction: 1200 mL Fluid  Diet effective now                   EDUCATION NEEDS:   Not appropriate for education at this time  Skin:  Skin Assessment: Skin Integrity Issues: Skin Integrity Issues:: Incisions Incisions: L leg, non-pressure wound  buttocks  Last BM:  2/11  Height:   Ht Readings from Last 1 Encounters:  05/11/21 5\' 11"  (1.803 m)    Weight:   Wt Readings from Last 1 Encounters:  05/20/21 94.1 kg   BMI:  Body mass index is 28.93 kg/m.  Estimated Nutritional Needs:   Kcal:  2200-2400  Protein:  110-120 grams  Fluid:  1.2 L/day  Corrin Parker, MS, RD, LDN RD pager number/after hours weekend pager number on Amion.

## 2021-05-20 NOTE — Progress Notes (Signed)
Physical Therapy Session Note  Patient Details  Name: [name] MRN: [id number] Date of Birth: May 29, 1950  Today's Date: 05/20/2021 PT Individual Time: 0931-0958 PT Individual Time Calculation (min): 27 min   Short Term Goals: Week 1:  PT Short Term Goal 1 (Week 1): Pt will perform sit <>stand w/LRAD and mod A PT Short Term Goal 2 (Week 1): Pt will perform bed <>chair transfer w/LRAD and mod A PT Short Term Goal 3 (Week 1): Pt will initiate gait training w/mod A and LRAD  Skilled Therapeutic Interventions/Progress Updates:    Pt seated in w/c on arrival and agreeable to therapy. Pt reported up to 8/10 pain with activity, premedicated. Rest and positioning provided as needed. Pt propelled w/c with BUE x 80 ft with supervision, transported the rest of distance to gym for time. Pt set up for squat pivot transfer, but began without locking brakes. Quickly noticed mistake with verbal cueing and then completed transfer safely with supervision. Sit<>prone with supervision. Pt remained prone ~5 minutes for hip flexor stretch. Directed in L hamstring curl to maintain knee ROM with quad set in extension to maintain quad mobility. Noted mild improvement in hip and knee posture after stretch. Pt returned to w/c with supervision squat pivot and propelled chair back to room, VC for incr shoulder extension for improved efficiency. Pt remained in w/c and was left with all needs in reach and alarm active.   Therapy Documentation Precautions:  Precautions Precautions: Fall Precaution Comments: wound vac L LE Required Braces or Orthoses: Other Brace Other Brace: limb protector L LE, AFO with shoe component to R LE Restrictions Weight Bearing Restrictions: Yes LLE Weight Bearing: Non weight bearing     Therapy/Group: Individual Therapy  Mickel Fuchs 05/20/2021, 9:48 AM

## 2021-05-20 NOTE — Progress Notes (Signed)
PROGRESS NOTE   Subjective/Complaints:  Pt reports pain better at night- tramadol helping- sleeping well at night x 2 days.   Had 5 cups of 1/2 full liquid in room- on bedside table.  Is floating L BKA.    ROS:   Pt denies SOB, abd pain, CP, N/V/C/D, and vision changes  Objective:   No results found. Recent Labs    05/18/21 1420  WBC 5.3  HGB 8.3*  HCT 25.1*  PLT 144*    Recent Labs    05/18/21 1420  NA 138  K 4.5  CL 100  CO2 22  GLUCOSE 158*  BUN 76*  CREATININE 8.56*  CALCIUM 9.0     Intake/Output Summary (Last 24 hours) at 05/20/2021 1454 Last data filed at 05/20/2021 1300 Gross per 24 hour  Intake 455 ml  Output --  Net 455 ml        Physical Exam: Vital Signs Blood pressure (!) 103/55, pulse 67, temperature 98.4 F (36.9 C), temperature source Oral, resp. rate 17, height 5\' 11"  (1.803 m), weight 94.1 kg, SpO2 100 %.     General: awake, alert, appropriate, has eyes closed- sitting up in w/c; NAD HENT: conjugate gaze; oropharynx moist CV: regular rate; no JVD Pulmonary: CTA B/L; no W/R/R- good air movement GI: soft, NT, ND, (+)BS Psychiatric: appropriate- less irritable, but still notable Neurological: Ox3- MS: L BKA floating on pillow- tip not touching anything Skin:    Comments: Left BKA dressed appropriately tender. Has shrinker in place- some dried blood- and well as popped blister at tip- some bleeding seen- trace Neurological:     Comments: Patient is alert.  Makes eye contact with examiner.  Oriented x3    Assessment/Plan: 1. Functional deficits which require 3+ hours per day of interdisciplinary therapy in a comprehensive inpatient rehab setting. Physiatrist is providing close team supervision and 24 hour management of active medical problems listed below. Physiatrist and rehab team continue to assess barriers to discharge/monitor patient progress toward functional and  medical goals  Care Tool:  Bathing    Body parts bathed by patient: Left arm, Right arm, Chest, Abdomen, Front perineal area, Face, Right lower leg, Right upper leg, Buttocks, Left upper leg   Body parts bathed by helper: Buttocks Body parts n/a: Left lower leg   Bathing assist Assist Level: Contact Guard/Touching assist (sitting on shower bench)     Upper Body Dressing/Undressing Upper body dressing   What is the patient wearing?: Pull over shirt    Upper body assist Assist Level: Set up assist    Lower Body Dressing/Undressing Lower body dressing      What is the patient wearing?: Ace wrap/stump shrinker     Lower body assist Assist for lower body dressing: Minimal Assistance - Patient > 75%     Toileting Toileting Toileting Activity did not occur (Clothing management and hygiene only): N/A (no void or bm)  Toileting assist Assist for toileting: Maximal Assistance - Patient 25 - 49% (sit to stand in the [place])     Transfers Chair/bed transfer  Transfers assist     Chair/bed transfer assist level: Minimal Assistance - Patient > 75%  Locomotion Ambulation   Ambulation assist   Ambulation activity did not occur: Safety/medical concerns (Poor motor planning, LLE NWB, Orthotic shoe on RLE, pain)          Walk 10 feet activity   Assist  Walk 10 feet activity did not occur: Safety/medical concerns (Poor motor planning, LLE NWB, Orthotic shoe on RLE, pain)        Walk 50 feet activity   Assist Walk 50 feet with 2 turns activity did not occur: Safety/medical concerns (Poor motor planning, LLE NWB, Orthotic shoe on RLE, pain)         Walk 150 feet activity   Assist Walk 150 feet activity did not occur: Safety/medical concerns (Poor motor planning, LLE NWB, Orthotic shoe on RLE, pain)         Walk 10 feet on uneven surface  activity   Assist Walk 10 feet on uneven surfaces activity did not occur: Safety/medical concerns (Poor motor  planning, LLE NWB, Orthotic shoe on RLE, pain)         Wheelchair     Assist Is the patient using a wheelchair?: Yes Type of Wheelchair: Manual    Wheelchair assist level: Supervision/Verbal cueing Max wheelchair distance: 184ft    Wheelchair 50 feet with 2 turns activity    Assist        Assist Level: Supervision/Verbal cueing   Wheelchair 150 feet activity     Assist      Assist Level: Supervision/Verbal cueing   Blood pressure (!) 103/55, pulse 67, temperature 98.4 F (36.9 C), temperature source Oral, resp. rate 17, height 5\' 11"  (1.803 m), weight 94.1 kg, SpO2 100 %. Medical Problem List and Plan: 1. Functional deficits secondary to left BKA 05/09/2021 secondary to peripheral vascular disease poor healing of recent left fourth and fifth ray amputation             -patient may shower but incision must be covered             -ELOS/Goals: 8-12 days S       Continue CIR- PT, OT -   Messaged April to schedule hospital follow-up  No limb guard- use shrinker and dressings OVER shrinker, per Dr [name].  2.  Antithrombotics: -DVT/anticoagulation:  Mechanical: Antiembolism stockings, thigh (TED hose) Right lower extremity             -antiplatelet therapy: N/A 3. Postoperative pain: d/c opioid medications due to sedation. Lyrica 150 mg daily and 300 mg nightly, oxycodone as needed. Schedule Tylenol 1000mg  TID, start 1,000mg  vitamin C daily. LFTs reviewed and stable. Add Amitriptyline 10mg  HS. Tramadol 50mg  q6H prn ordered.  2/11- pain better controlled today- will con't current regimen and titrate as required. 2/12- really likes tramadol for pain control- con't regimen  4. Mood: Provide emotional support             -antipsychotic agents: N/A 5. Neuropsych: This patient is capable of making decisions on his own behalf. 6. Skin/Wound Care: Routine skin checks 7. Hyponatremia: monitor Na with HD labs.   2/11- Na 138- con't to monitor 8.  End-stage renal disease.   Continue hemodialysis as per renal services. Diet changed to regular as per patient request.  9.  Acute on chronic anemia.  Hgb improving, monitor w/ HD labs.  10.  Hypertension.  Continue Cozaar 100 mg daily, Increase Coreg to 25 mg twice daily.  Monitor with increased mobility 11.  Diabetes mellitus with peripheral neuropathy.  Hemoglobin A1c 5.5.  NovoLog 2 units 3 times daily. Discussed outpatient Qutenza and he is very interested, messaged April to schedule this.   2/11- CBGs 95-164- 1 value above 150- con't regimen  2/12- CBGs 97-160- con't regimen 12.  Hypothyroidism.  Continue Synthroid 13.  Hyperlipidemia.  Crestor 14.  History of gout.  Continue allopurinol.  Monitor for any gout flareups 15.  History of atrial flutter with ablation 04/06/2020.  Follow-up cardiology services as needed.  Cardiac rate controlled 16.  History of prostate cancer status post TURP 06/28/2020.  Follow-up outpatient 17.  Obesity.  BMI 29.99.  Dietary follow-up 18.  GERD.  Protonix 19. Sedation: hold opioid medications 20. Vitamin D deficiency: start ergocalciferol 50,000U once per week for 7 weeks. 21. Constipation: continue colace to 100mg  TID and add Senna 2 tabs HS. Discussed increasing bowel regimen but he defers at this time  2/12- Small BM yesterday- con't regimen 22. Liver protection while on Tylenol: discussed benefits of NAC-will have to give outpatient as hospital does not allow supplements not approved by FDA, discussed with pharmacy 23. L BKA  2/11- will stop limb guard- also start Nepro per Dr [name] BID- since ESRD and DM.     LOS: 9 days A FACE TO FACE EVALUATION WAS PERFORMED  [name] 05/20/2021, 2:54 PM

## 2021-05-21 LAB — CBC
HCT: 30.1 % — ABNORMAL LOW (ref 39.0–52.0)
Hemoglobin: 10.2 g/dL — ABNORMAL LOW (ref 13.0–17.0)
MCH: 30.5 pg (ref 26.0–34.0)
MCHC: 33.9 g/dL (ref 30.0–36.0)
MCV: 90.1 fL (ref 80.0–100.0)
Platelets: 184 10*3/uL (ref 150–400)
RBC: 3.34 MIL/uL — ABNORMAL LOW (ref 4.22–5.81)
RDW: 16.1 % — ABNORMAL HIGH (ref 11.5–15.5)
WBC: 7 10*3/uL (ref 4.0–10.5)
nRBC: 0 % (ref 0.0–0.2)

## 2021-05-21 LAB — RENAL FUNCTION PANEL
Albumin: 3.7 g/dL (ref 3.5–5.0)
Anion gap: 15 (ref 5–15)
BUN: 64 mg/dL — ABNORMAL HIGH (ref 8–23)
CO2: 25 mmol/L (ref 22–32)
Calcium: 9.2 mg/dL (ref 8.9–10.3)
Chloride: 95 mmol/L — ABNORMAL LOW (ref 98–111)
Creatinine, Ser: 7.03 mg/dL — ABNORMAL HIGH (ref 0.61–1.24)
GFR, Estimated: 8 mL/min — ABNORMAL LOW (ref >60)
Glucose, Bld: 148 mg/dL — ABNORMAL HIGH (ref 70–99)
Phosphorus: 4.2 mg/dL (ref 2.5–4.6)
Potassium: 3.7 mmol/L (ref 3.5–5.1)
Sodium: 135 mmol/L (ref 135–145)

## 2021-05-21 LAB — GLUCOSE, CAPILLARY
Glucose-Capillary: 122 mg/dL — ABNORMAL HIGH (ref 70–99)
Glucose-Capillary: 121 mg/dL — ABNORMAL HIGH (ref 70–99)

## 2021-05-21 NOTE — Progress Notes (Signed)
Physical Therapy Session Note  Patient Details  Name: [name] MRN: [id number] Date of Birth: [birth year]  Today's Date: 05/21/2021 PT Individual Time: 9323-5573 PT Individual Time Calculation (min): 63 min  PT Missed Time: 12 minutes PT Missed Time Reason: Pt taking phone call  Short Term Goals: Week 1:  PT Short Term Goal 1 (Week 1): Pt will perform sit <>stand w/LRAD and mod A PT Short Term Goal 2 (Week 1): Pt will perform bed <>chair transfer w/LRAD and mod A PT Short Term Goal 3 (Week 1): Pt will initiate gait training w/mod A and LRAD  Skilled Therapeutic Interventions/Progress Updates:   Received pt sitting in [facility] with wife present for family education. Pt agreeable to PT treatment and reported pain was "tolerable" and did not rate pain but did c/o increased pain throughout session with residual limb was in dependent position. Session with emphasis on caregiver training, D/C planning, functional mobility/transfers, generalized strengthening, simulated car transfers, and improved endurance with activity. Pt performed WC mobility 168ft using BUE and supervision to ortho gym and performed simulated car transfer x 2 trials with min A via squat<>pivot. Trial 1 with therapist and trial 2 with pt's wife. Educated wife on body mechanics and positioning to assist with transfers as well as proper positioning of WC at 45 degree angle. Pt did require cues for safety with car transfer technique, specifically to turn all the way around and get RLE as close to [facility] as possibly prior to exiting car. Pt's wife with c/o regarding toilet transfers. OT recommended bringing bedside commode into open space in bathroom for transfer, however pt's wife initially unreceptive to idea due to responsibility of having to dispose of pt's waste. PT emphasized OT's recommendation, pointing out that water closet where toilet is, is too narrow to get WC next to toilet and pt is unable to safely "hop" on RLE due to  eversion/PF/supination contracture and increased fall risk associated with performing transfer in that manner. Pt's wife also with questions regarding removing WC wheels to get pt into bathroom. Again, advised against this idea as front casters and anti-tippers are different heights and once main wheels are removed, there is no wheel for the brakes to lock in place - posing a huge safety concern. After significantly increased time and demonstrating safety risks associated with "hopping", pt's wife finally agreed to bring bedside commode into open space in bathroom and placing trash bags in commode bucket with plan for pt to dispose of it himself from a WC level. Transported to ADL apartment and practiced furniture transfers on/off recliner to simulate home environment x 2 trials with min A overall - difficulty when transferring to L and uphill. Trial 1 with therapist and trial 2 with pt's wife - again with emphasis on education regarding positioning, hand placement, and body mechanics. Pt fatigued at end of session and requesting to take phone call. Concluded session with pt sitting in [facility] with all needs within reach and wife present at bedside. 12 minutes missed of skilled physical therapy due to pt taking phone call.   Therapy Documentation Precautions:  Precautions Precautions: Fall Precaution Comments: wound vac L LE Required Braces or Orthoses: Other Brace Other Brace: limb protector L LE, AFO with shoe component to R LE Restrictions Weight Bearing Restrictions: Yes LLE Weight Bearing: Non weight bearing  Therapy/Group: Individual Therapy Alfonse Alpers PT, DPT   05/21/2021, 7:26 AM

## 2021-05-21 NOTE — Progress Notes (Signed)
[facility] Progress Note  Subjective: seen in room, no complaints/acute events overnight. Open for HD later today  Vitals:   05/20/21 1240 05/20/21 1715 05/20/21 1952 05/21/21 0456  BP: (!) 103/55 129/67 (!) 139/57 (!) 119/52  Pulse: 67 67 76 (!) 51  Resp: 17  16 16   Temp: 98.4 F (36.9 C)  98.4 F (36.9 C) 98.2 F (36.8 C)  TempSrc: Oral  Oral Oral  SpO2: 100%  100% 97%  Weight:    94 kg  Height:        Exam: Gen: NAD, comfortable appearing CVS: Pulse regular rhythm, normal rate, S1 and S2 normal Resp: Clear to auscultation bilaterally, no rales/rhonchi Abd: Soft, obese, nontender, bowel sounds normal Ext: Status post left below-knee amputation with clean dressing Neuro: awake, alert Dialysis access: LUE AVF +b/t    Recent Labs  Lab 05/16/21 1009 05/18/21 1420  K 4.7 4.5  BUN 70* 76*  CREATININE 8.51* 8.56*  ALBUMIN  --  3.4*  CALCIUM 8.8* 9.0  PHOS  --  5.3*   Inpatient medications:  acetaminophen  1,000 mg Oral TID   allopurinol  200 mg Oral Daily   amitriptyline  10 mg Oral QHS   vitamin C  1,000 mg Oral Daily   bisacodyl  10 mg Rectal Once   calcitRIOL  0.5 mcg Oral Daily   carvedilol  25 mg Oral BID WC   darbepoetin (ARANESP) injection - DIALYSIS  100 mcg Intravenous Q Thu-HD   docusate sodium  100 mg Oral TID   feeding supplement (NEPRO CARB STEADY)  237 mL Oral TID BM   insulin aspart  0-6 Units Subcutaneous TID WC   insulin aspart  2 Units Subcutaneous TID WC   levothyroxine  112 mcg Oral QAC breakfast   losartan  100 mg Oral Daily   nutrition supplement (JUVEN)  1 packet Oral BID BM   pantoprazole  40 mg Oral Daily   polyvinyl alcohol  1-2 drop Both Eyes Daily   pregabalin  150 mg Oral Daily   pregabalin  300 mg Oral QHS   rosuvastatin  10 mg Oral Daily   Semaglutide(0.25 or 0.5MG /DOS)  0.5 mg Subcutaneous Q7 days   senna  2 tablet Oral QHS   sevelamer carbonate  800 mg Oral TID WC   Vitamin D (Ergocalciferol)  50,000 Units  Oral Q7 days   zinc sulfate  220 mg Oral Daily    phenol, polyethylene glycol, potassium chloride, traMADol   OP HD: NxStage HHD 4x/ week   2K lactate 45 AVF w/ buttonholes  15g    89kg  Hep none  - mircera 100 mcg SQ last given 1/26 > due 2/09  Assessment/ Plan: SP L BKA - for nonhealing diabetic foot ulcer/osteomyelitis, done by Dr. [name] on 05/09/21.  He is currently admitted to the inpatient rehabilitation unit.   ESRD: Usually 4d/week at home, plan MWF schedule here. Next HD today Hypertension/volume: BP good on Losartan and Coreg. 3kg up today, mild edema on exam. Cont to lower vol w/ next HD.   Anemia: Hgb 8.5- 10 here. Esa due on 2/09 > ordered darbe 100 mcg weekly on 2/9  Metabolic bone disease: Calcium and phosphorus levels currently at goal. Continue calcitriol and renvela.   Nutrition:  on protein supplement.  Diabetes mellitus: on insulin, management per primary team  Gean Quint, MD [facility]

## 2021-05-21 NOTE — Discharge Summary (Signed)
Physician Discharge Summary  Patient ID: [name] MRN: [id number] DOB/AGE: [birth year] 71 y.o.  Admit date: 05/11/2021 Discharge date: 05/24/2020  Discharge Diagnoses:  Principal Problem:   Left below-knee amputee [facility]) Pain management End-stage renal disease Acute on chronic anemia Hypertension Diabetes mellitus with peripheral neuropathy Hypothyroidism Hyperlipidemia History of gout History of atrial flutter with ablation History of prostate cancer/TURP Obesity GERD  Discharged Condition: Stable  Significant Diagnostic Studies: No results found.  Labs:  Basic Metabolic Panel: Recent Labs  Lab 05/18/21 1420 05/21/21 2049 05/23/21 1252  NA 138 135 135  K 4.5 3.7 5.0  CL 100 95* 94*  CO2 22 25 25   GLUCOSE 158* 148* 122*  BUN 76* 64* 105*  CREATININE 8.56* 7.03* 10.68*  CALCIUM 9.0 9.2 9.9  PHOS 5.3* 4.2 6.6*    CBC: Recent Labs  Lab 05/18/21 1420 05/21/21 2049 05/23/21 1252  WBC 5.3 7.0 7.7  HGB 8.3* 10.2* 9.6*  HCT 25.1* 30.1* 29.8*  MCV 90.9 90.1 91.1  PLT 144* 184 168    CBG: Recent Labs  Lab 05/22/21 2101 05/23/21 0632 05/23/21 1203 05/23/21 1659 05/23/21 2105  GLUCAP 106* 107* 105* 107* 161*   Family history.  Mother with diabetes mellitus kidney disease.  Father with diabetes mellitus CAD and prostate cancer.  Negative for colon cancer esophageal cancer or rectal cancer  Brief HPI:   [name] is a 71 y.o. right-handed male with history of end-stage renal disease with hemodialysis diabetes mellitus gout prostate cancer with TURP 06/28/2020 hypertension atrial flutter with ablation 04/06/2020 per Dr. [name], CABG 2008, quit smoking 32 years ago.  Per chart review lives with spouse.  Multilevel home with one-step to entry.  Modified independent prior to admission.  Presented 04/27/2021 with pain ulceration deformity left foot status post left fourth and fifth ray amputation 02/02/2021 per Dr. [name].  Limb was not felt to be salvageable and  underwent left BKA 05/09/2021 per Dr. [name].  Wound VAC applied.  Hospital course follow-up renal services hemodialysis ongoing.  Acute on chronic anemia latest hemoglobin 9.7.  Therapy evaluations completed due to patient decreased functional mobility was admitted for a comprehensive rehab program.   Hospital Course: [name] was admitted to rehab 05/11/2021 for inpatient therapies to consist of PT, ST and OT at least three hours five days a week. Past admission physiatrist, therapy team and rehab RN have worked together to provide customized collaborative inpatient rehab.  Pertaining to patient's left BKA 05/09/2021 secondary to peripheral vascular disease poor healing of recent left fourth and fifth ray amputation remained stable follow-up orthopedic services limb guarded been discontinued as well as wound VAC using shrinker and dressing changes over shrinker.  Pain managed with use of Elavil 10 mg nightly, Lyrica as directed, tramadol as needed.  Hemodialysis ongoing as per renal services.  Acute on chronic anemia no bleeding episodes.  Blood pressure monitored on Coreg as well as Cozaar.  Blood sugars controlled hemoglobin A1c 5.5 discussed outpatient Qutenza as outpatient.  Hormone supplement ongoing for hypothyroidism.  Crestor for hyperlipidemia.  History of gout with no gout flareups maintained on allopurinol.  History of atrial flutter with ablation 04/06/2020 no chest pain or shortness of breath follow-up outpatient with cardiology services.  Noted obesity BMI 29.99 dietary follow-up.  Bouts of constipation resolved with laxative assistance.   Blood pressures were monitored on TID basis and soft and monitored  Diabetes has been monitored with ac/hs CBG checks and SSI was use prn for  tighter BS control.    Rehab course: During patient's stay in rehab weekly team conferences were held to monitor patient's progress, set goals and discuss barriers to discharge. At admission, patient required moderate  assist sit to stand minimal guard sit to supine  Physical exam.  Blood pressure 148/57 pulse 81 temperature 99.3 respirations 18 oxygen saturation 94% room air Constitutional.  No acute distress HEENT Head.  Normocephalic and atraumatic Eyes.  Pupils round and reactive to light no discharge.nystagmus Neck.  Supple nontender no JVD without thyromegaly Cardiac regular rate rhythm moderate extra sounds or murmur heard Abdomen.  Soft nontender positive bowel sounds without rebound Respiratory effort normal no respiratory distress without wheeze Skin.  Left BKA dressed appropriately tender Neurologic.  Alert oriented x3 follows commands  He/She  has had improvement in activity tolerance, balance, postural control as well as ability to compensate for deficits. He/She has had improvement in functional use RUE/LUE  and RLE/LLE as well as improvement in awareness.  Propelled wheelchair supervision.  Set up for squat pivot transfers.  Sit to supine with supervision.  Performed anterior and lateral leans supervision.  Modified independent grooming supervision upper body bathing minimal assist lower body bathing minimal assist lower body dressing.  Full family teaching completed plan discharged to home       Disposition: Discharge to home    Diet: Regular consistency diet 1200 mL fluid restriction  Special Instructions: No driving smoking or alcohol  Continue hemodialysis as directed.  Medications at discharge 1.  Tylenol as needed 2.  Zyloprim 200 mg p.o. daily 3.  Elavil 25 mg p.o. nightly 4.  Vitamin C 1000 mg p.o. daily 5.  Rocaltrol 0.5 mcg p.o. daily 6.  Coreg 25 mg p.o. twice daily 7.  Aranesp weekly with hemodialysis 8.  Colace 100 mg p.o. 3 times daily 9.  NovoLog 2 units 3 times daily with meals 10.  Synthroid 112 mcg p.o. daily before breakfast 11.  Cozaar 100 mg p.o. daily 12.  Protonix 40 mg p.o. daily 13.  Lyrica 150 mg daily and 300 mg nightly 14.  Crestor 10 mg p.o.  daily 15.Semaglutide 0.5 mcg subcutaneous every 7 days 16.  Renvela 800 mg p.o. 3 times daily 17.  Tramadol 50 mg p.o. every 6 hours as needed pain 18.  Vitamin D 50,000 units every 7 days 19.  Zinc sulfate 220 mg p.o. daily 20.Robaxin 500 mg every 8 hour as needed  30-35 minutes were spent completing discharge summary and discharge planning  Discharge Instructions     Ambulatory referral to Physical Medicine Rehab   Complete by: As directed    Moderate complexity follow-up 1 to 2 weeks left BKA        Follow-up Information     Raulkar, Clide Deutscher, MD Follow up.   Specialty: Physical Medicine and Rehabilitation Why: 08/21/21 20 minute follow-up appointment, please arrive at 1:40pm for 2:00pm appointment 11/19/21 40 minute appointment for application for Qutenza (capsaicin patch) for peripheral neuropathy, please arrive at 12:40pm for 1:00pm appointment Contact information: [address] [place] [address] [phone]         [name], MD Follow up.   Specialty: Orthopedic Surgery Why: Call for appointment Contact information: [place] [address] [phone]         Elmarie Shiley, MD Follow up.   Specialty: Nephrology Why: Call for appointment as needed Contact information: [place] [address] [phone]  Signed: Lavon Paganini Iylah Dworkin 05/24/2021, 5:27 AM

## 2021-05-21 NOTE — Progress Notes (Signed)
Occupational Therapy Session Note  Patient Details  Name: [name] MRN: [id number] Date of Birth: [birth year]  Today's Date: 05/21/2021 OT Individual Time: 3267-1245 OT Individual Time Calculation (min): 43 min    Short Term Goals: Week 1:  OT Short Term Goal 1 (Week 1): Pt will complete 1/3 components of toileting with CGA OT Short Term Goal 1 - Progress (Week 1): Met OT Short Term Goal 2 (Week 1): Pt will complete LB dressing at sit<stand level with 1 assist OT Short Term Goal 2 - Progress (Week 1): Met OT Short Term Goal 3 (Week 1): Pt will complete UB self care EOB with no more than supervision assist for sitting balance OT Short Term Goal 3 - Progress (Week 1): Met Week 2:  OT Short Term Goal 1 (Week 2): STG=LTG due to ELOS  Skilled Therapeutic Interventions/Progress Updates:  Patient met lying supine in bed in agreement with OT treatment session. 5/10 pain reported at rest and with activity in residual limb. RN made aware. OT treatment session with focus on self-care re-education and functional transfers. Patient concerned about running out of time opting to complete LB bathing/dressing at EOB/supine with rolling/lateral leans as opposed to at sitting/standing level with RW. UB bathing/dressing with set-up assist and LB bathing/dressing with CGA for safety. Patient with low frustration tolerance requiring cues for rest breaks. Squat-pivot to wc on R with close supervision A. Wc mobility to sink surface in prep for grooming with patient completing 3/3 grooming tasks with Mod I in sitting. Patient spent a few minutes communicating with wife via text to confirm arrival for family ed. Later this a.m. Session concluded with patient seated in wc with call bell within reach, chair alarm activated and all needs met.   Therapy Documentation Precautions:  Precautions Precautions: Fall Precaution Comments: wound vac L LE Required Braces or Orthoses: Other Brace Other Brace: limb protector L  LE, AFO with shoe component to R LE Restrictions Weight Bearing Restrictions: Yes LLE Weight Bearing: Non weight bearing General:   Therapy/Group: Individual Therapy  Michaeal Davis R Howerton-Davis 05/21/2021, 8:47 AM

## 2021-05-21 NOTE — Progress Notes (Signed)
Pt's case discussed with nephrologist. Plans for possible d/c on 2/15 noted. Plans are for pt to resume home HD upon d/c from rehab. Will assist as needed.   Melven Sartorius Renal Navigator [phone]

## 2021-05-21 NOTE — Progress Notes (Signed)
Occupational Therapy Session Note  Patient Details  Name: [name] MRN: [id number] Date of Birth: 1950/12/08  Today's Date: 05/21/2021 OT Individual Time: 4098-1191 OT Individual Time Calculation (min): 87 min    Short Term Goals: Week 2:  OT Short Term Goal 1 (Week 2): STG=LTG due to ELOS  Skilled Therapeutic Interventions/Progress Updates:  Skilled OT intervention completed with focus on family education/hands on training, shower transfers, functional transfers, and activity tolerance. Pt received seated in w/c with pt's wife present. MD in room for morning rounds. Throughout session education provided included the following topics:  Limb care/self-inspection with mirror that therapist issued pt Limb guard education and wearing schedule Pt's CLOF, assist level, home/bathroom management recommendations DME education, and assembly instructions as well as set up for putting into shower  Shower transfers to tub bench via squat pivot, with demonstration provided by therapist at [facility] from w/c<>TTB. Pt's wife able to return demonstrate transfer method at the [facility] level with good safety strategies and body mechanics.  W/c <> bed transfers via squat and stand pivots, with therapist demonstrating and pt's wife participating in hands on practice with CGA provided for both methods.  Discussed safety strategies for all transfers, and how to increase pt's independence, with extensive conversation about pt's bathroom/toilet situation. This therapist recommending HIGHLY to take [facility] out into accessible bathroom or different space for ease of access vs standing and pivoting/hopping with RW to get to toilet due to safety concern.  Pt's wife with concern about originally planned d/c date, with this therapist discussing with pt and family about potentially extending for more practice with toilet transfers. Pt left seated in w/c with chair alarm on, wife present and all needs in reach at end of  session.    Therapy Documentation Precautions:  Precautions Precautions: Fall Precaution Comments: wound vac L LE Required Braces or Orthoses: Other Brace Other Brace: limb protector L LE, AFO with shoe component to R LE Restrictions Weight Bearing Restrictions: Yes LLE Weight Bearing: Non weight bearing  Pain: Unrated pain in residual limb, provided support for comfort, MD/nurse aware  Therapy/Group: Individual Therapy  Isael Stille E Zakkery Dorian 05/21/2021, 7:35 AM

## 2021-05-21 NOTE — Progress Notes (Signed)
PROGRESS NOTE   Subjective/Complaints: Wife would like tramadol d/ced as she feels if is making him too drowsy- patient is agreeable to this though he feels it helped with his pain. Sleeping well at night  ROS:   Pt denies SOB, abd pain, CP, N/V/C/D, and vision changes, +residual limb pain  Objective:   No results found. Recent Labs    05/18/21 1420  WBC 5.3  HGB 8.3*  HCT 25.1*  PLT 144*    Recent Labs    05/18/21 1420  NA 138  K 4.5  CL 100  CO2 22  GLUCOSE 158*  BUN 76*  CREATININE 8.56*  CALCIUM 9.0     Intake/Output Summary (Last 24 hours) at 05/21/2021 1001 Last data filed at 05/20/2021 1824 Gross per 24 hour  Intake 356 ml  Output --  Net 356 ml        Physical Exam: Vital Signs Blood pressure (!) 119/52, pulse (!) 51, temperature 98.2 F (36.8 C), temperature source Oral, resp. rate 16, height 5\' 11"  (1.803 m), weight 94 kg, SpO2 97 %.     General: awake, alert, appropriate, has eyes closed- sitting up in w/c; NAD HENT: conjugate gaze; oropharynx moist CV: Bradycardic Pulmonary: CTA B/L; no W/R/R- good air movement GI: soft, NT, ND, (+)BS Psychiatric: appropriate- less irritable, but still notable Neurological: Ox3- MS: L BKA floating on pillow- tip not touching anything Skin:    Comments: Left BKA dressed appropriately tender. Has shrinker in place- some dried blood- and well as popped blister at tip- some bleeding seen- trace Neurological:     Comments: Patient is alert.  Makes eye contact with examiner.  Oriented x3    Assessment/Plan: 1. Functional deficits which require 3+ hours per day of interdisciplinary therapy in a comprehensive inpatient rehab setting. Physiatrist is providing close team supervision and 24 hour management of active medical problems listed below. Physiatrist and rehab team continue to assess barriers to discharge/monitor patient progress toward functional  and medical goals  Care Tool:  Bathing    Body parts bathed by patient: Left arm, Right arm, Chest, Abdomen, Front perineal area, Face, Right lower leg, Right upper leg, Buttocks, Left upper leg   Body parts bathed by helper: Buttocks Body parts n/a: Left lower leg   Bathing assist Assist Level: Contact Guard/Touching assist (sitting on shower bench)     Upper Body Dressing/Undressing Upper body dressing   What is the patient wearing?: Pull over shirt    Upper body assist Assist Level: Set up assist    Lower Body Dressing/Undressing Lower body dressing      What is the patient wearing?: Ace wrap/stump shrinker     Lower body assist Assist for lower body dressing: Minimal Assistance - Patient > 75%     Toileting Toileting Toileting Activity did not occur (Clothing management and hygiene only): N/A (no void or bm)  Toileting assist Assist for toileting: Maximal Assistance - Patient 25 - 49% (sit to stand in the [place])     Transfers Chair/bed transfer  Transfers assist     Chair/bed transfer assist level: Minimal Assistance - Patient > 75%     Locomotion Ambulation  Ambulation assist   Ambulation activity did not occur: Safety/medical concerns (Poor motor planning, LLE NWB, Orthotic shoe on RLE, pain)          Walk 10 feet activity   Assist  Walk 10 feet activity did not occur: Safety/medical concerns (Poor motor planning, LLE NWB, Orthotic shoe on RLE, pain)        Walk 50 feet activity   Assist Walk 50 feet with 2 turns activity did not occur: Safety/medical concerns (Poor motor planning, LLE NWB, Orthotic shoe on RLE, pain)         Walk 150 feet activity   Assist Walk 150 feet activity did not occur: Safety/medical concerns (Poor motor planning, LLE NWB, Orthotic shoe on RLE, pain)         Walk 10 feet on uneven surface  activity   Assist Walk 10 feet on uneven surfaces activity did not occur: Safety/medical concerns (Poor motor  planning, LLE NWB, Orthotic shoe on RLE, pain)         Wheelchair     Assist Is the patient using a wheelchair?: Yes Type of Wheelchair: Manual    Wheelchair assist level: Supervision/Verbal cueing Max wheelchair distance: 122ft    Wheelchair 50 feet with 2 turns activity    Assist        Assist Level: Supervision/Verbal cueing   Wheelchair 150 feet activity     Assist      Assist Level: Supervision/Verbal cueing   Blood pressure (!) 119/52, pulse (!) 51, temperature 98.2 F (36.8 C), temperature source Oral, resp. rate 16, height 5\' 11"  (1.803 m), weight 94 kg, SpO2 97 %. Medical Problem List and Plan: 1. Functional deficits secondary to left BKA 05/09/2021 secondary to peripheral vascular disease poor healing of recent left fourth and fifth ray amputation             -patient may shower but incision must be covered             -ELOS/Goals: 8-12 days S       Continue CIR- PT, OT -   Messaged April to schedule hospital follow-up  No limb guard- use shrinker and dressings OVER shrinker, per Dr [name].  2.  Antithrombotics: -DVT/anticoagulation:  Mechanical: Antiembolism stockings, thigh (TED hose) Right lower extremity             -antiplatelet therapy: N/A 3. Postoperative pain: d/c opioid medications due to sedation. Lyrica 150 mg daily and 300 mg nightly, oxycodone as needed. Schedule Tylenol 1000mg  TID, start 1,000mg  vitamin C daily. LFTs reviewed and stable. Add Amitriptyline 10mg  HS. D/c tramadol as per wife's request since making him drowsy  2/11- pain better controlled today- will con't current regimen and titrate as required. 2/12- really likes tramadol for pain control- con't regimen  4. Anxiety: Provide emotional support. Continue amitriptyline 10mg  HS.  5. Neuropsych: This patient is capable of making decisions on his own behalf. 6. Skin/Wound Care: Routine skin checks 7. Hyponatremia: resolved, continue to monitor Na with HD labs.  8.  End-stage  renal disease.  Continue hemodialysis as per renal services. Diet changed to regular as per patient request.  9.  Acute on chronic anemia.  Hgb improving, monitor w/ HD labs.  10.  Hypertension.  Continue Cozaar 100 mg daily, Increase Coreg to 25 mg twice daily.  Monitor with increased mobility 11.  Diabetes mellitus with peripheral neuropathy.  Hemoglobin A1c 5.5.  NovoLog 2 units 3 times daily. Discussed outpatient Qutenza and he is  very interested, messaged April to schedule this.   2/11- CBGs 95-164- 1 value above 150- con't regimen  2/12- CBGs 97-160- con't regimen 12.  Hypothyroidism.  Continue Synthroid 13.  Hyperlipidemia.  Crestor 14.  History of gout.  Continue allopurinol.  Monitor for any gout flareups 15.  History of atrial flutter with ablation 04/06/2020.  Follow-up cardiology services as needed.  Cardiac rate controlled 16.  History of prostate cancer status post TURP 06/28/2020.  Follow-up outpatient 17.  Obesity.  BMI 29.99.  Dietary follow-up 18.  GERD.  Protonix 19. Sedation: hold opioid medications 20. Vitamin D deficiency: start ergocalciferol 50,000U once per week for 7 weeks. 21. Constipation: continue colace to 100mg  TID and add Senna 2 tabs HS. Discussed increasing bowel regimen but he defers at this time  2/12- Small BM yesterday- con't regimen 22. Liver protection while on Tylenol: discussed benefits of NAC-will have to give outpatient as hospital does not allow supplements not approved by FDA, discussed with pharmacy 23. L BKA  2/11- will stop limb guard- also start Nepro per Dr [name] BID- since ESRD and DM.     LOS: 10 days A FACE TO FACE EVALUATION WAS PERFORMED  Jayelyn Barno P Wes Lezotte 05/21/2021, 10:01 AM

## 2021-05-22 ENCOUNTER — Telehealth: Payer: Self-pay | Admitting: Orthopedic Surgery

## 2021-05-22 ENCOUNTER — Other Ambulatory Visit (HOSPITAL_COMMUNITY): Payer: Self-pay

## 2021-05-22 LAB — GLUCOSE, CAPILLARY
Glucose-Capillary: 167 mg/dL — ABNORMAL HIGH (ref 70–99)
Glucose-Capillary: 131 mg/dL — ABNORMAL HIGH (ref 70–99)
Glucose-Capillary: 115 mg/dL — ABNORMAL HIGH (ref 70–99)
Glucose-Capillary: 113 mg/dL — ABNORMAL HIGH (ref 70–99)
Glucose-Capillary: 180 mg/dL — ABNORMAL HIGH (ref 70–99)
Glucose-Capillary: 131 mg/dL — ABNORMAL HIGH (ref 70–99)
Glucose-Capillary: 106 mg/dL — ABNORMAL HIGH (ref 70–99)

## 2021-05-22 MED ORDER — DARBEPOETIN ALFA 100 MCG/0.5ML IJ SOSY: 100.0000 ug | PREFILLED_SYRINGE | INTRAMUSCULAR | Status: DC

## 2021-05-22 MED ORDER — CARVEDILOL 25 MG PO TABS
25.0000 mg | ORAL_TABLET | Freq: Two times a day (BID) | ORAL | 0 refills | Status: DC
  Filled 2021-05-22: qty 60, 30d supply, fill #0

## 2021-05-22 MED ORDER — LEVOTHYROXINE SODIUM 112 MCG PO TABS
112.0000 ug | ORAL_TABLET | Freq: Every day | ORAL | 0 refills | Status: DC
  Filled 2021-05-22: qty 30, 30d supply, fill #0

## 2021-05-22 MED ORDER — PANTOPRAZOLE SODIUM 40 MG PO TBEC
40.0000 mg | DELAYED_RELEASE_TABLET | Freq: Every day | ORAL | 0 refills | Status: DC
  Filled 2021-05-22: qty 30, 30d supply, fill #0

## 2021-05-22 MED ORDER — LOSARTAN POTASSIUM 100 MG PO TABS
100.0000 mg | ORAL_TABLET | Freq: Every day | ORAL | 0 refills | Status: DC
  Filled 2021-05-22: qty 30, 30d supply, fill #0

## 2021-05-22 MED ORDER — ALLOPURINOL 100 MG PO TABS
200.0000 mg | ORAL_TABLET | Freq: Every day | ORAL | 0 refills | Status: DC
  Filled 2021-05-22: qty 30, 15d supply, fill #0

## 2021-05-22 MED ORDER — INSULIN PEN NEEDLE 32G X 4 MM MISC: 0 refills | Status: DC

## 2021-05-22 MED ORDER — ASCORBIC ACID 1000 MG PO TABS
1000.0000 mg | ORAL_TABLET | Freq: Every day | ORAL | 0 refills | Status: DC
  Filled 2021-05-22: qty 30, 30d supply, fill #0

## 2021-05-22 MED ORDER — PREGABALIN 150 MG PO CAPS
ORAL_CAPSULE | ORAL | 0 refills | Status: DC
  Filled 2021-05-22: qty 90, 30d supply, fill #0

## 2021-05-22 MED ORDER — ROSUVASTATIN CALCIUM 10 MG PO TABS
10.0000 mg | ORAL_TABLET | Freq: Every day | ORAL | 0 refills | Status: DC
Start: 2021-05-22 — End: 2022-08-08
  Filled 2021-05-22: qty 30, 30d supply, fill #0

## 2021-05-22 MED ORDER — OZEMPIC (0.25 OR 0.5 MG/DOSE) 2 MG/1.5ML ~~LOC~~ SOPN
0.5000 mg | PEN_INJECTOR | SUBCUTANEOUS | 0 refills | Status: DC
  Filled 2021-05-22: qty 1.5, 28d supply, fill #0

## 2021-05-22 MED ORDER — DIALYVITE 800/ZINC 0.8 MG PO TABS
1.0000 | ORAL_TABLET | Freq: Every day | ORAL | 0 refills | Status: DC
  Filled 2021-05-22: qty 30, 30d supply, fill #0

## 2021-05-22 MED ORDER — INSULIN PEN NEEDLE 32G X 4 MM MISC
0 refills | Status: DC
  Filled 2021-05-22: qty 100, 25d supply, fill #0

## 2021-05-22 MED ORDER — CALCITRIOL 0.5 MCG PO CAPS
0.5000 ug | ORAL_CAPSULE | Freq: Every day | ORAL | 0 refills | Status: DC
  Filled 2021-05-22: qty 30, 30d supply, fill #0

## 2021-05-22 MED ORDER — AMITRIPTYLINE HCL 10 MG PO TABS
10.0000 mg | ORAL_TABLET | Freq: Every day | ORAL | 0 refills | Status: DC
  Filled 2021-05-22: qty 30, 30d supply, fill #0

## 2021-05-22 MED ORDER — ZINC SULFATE 220 (50 ZN) MG PO TABS
220.0000 mg | ORAL_TABLET | Freq: Every day | ORAL | 0 refills | Status: DC
  Filled 2021-05-22: qty 30, 30d supply, fill #0

## 2021-05-22 MED ORDER — INSULIN ASPART 100 UNIT/ML FLEXPEN
2.0000 [IU] | PEN_INJECTOR | Freq: Three times a day (TID) | SUBCUTANEOUS | 11 refills | Status: DC
  Filled 2021-05-22: qty 3, 30d supply, fill #0

## 2021-05-22 MED ORDER — SEVELAMER CARBONATE 800 MG PO TABS
800.0000 mg | ORAL_TABLET | Freq: Three times a day (TID) | ORAL | 0 refills | Status: DC
Start: 2021-05-22 — End: 2022-07-29
  Filled 2021-05-22: qty 90, 30d supply, fill #0

## 2021-05-22 MED ORDER — VITAMIN D (ERGOCALCIFEROL) 1.25 MG (50000 UNIT) PO CAPS
50000.0000 [IU] | ORAL_CAPSULE | ORAL | 0 refills | Status: DC
  Filled 2021-05-22: qty 5, 35d supply, fill #0

## 2021-05-22 MED ORDER — DOCUSATE SODIUM 100 MG PO CAPS: 100.0000 mg | ORAL_CAPSULE | Freq: Three times a day (TID) | ORAL | 0 refills | Status: DC

## 2021-05-22 NOTE — Progress Notes (Signed)
[facility] Progress Note  Subjective: seen in room, no complaints/acute events overnight. Tolerated hd yesterday, net uf 2751cc. He wishes to have HD here tomorrow and be discharged on Thursday  Vitals:   05/21/21 1830 05/21/21 1858 05/21/21 1944 05/22/21 0507  BP: (!) 102/49 109/65 127/65 140/60  Pulse: 66 65 66 65  Resp:  20 18 18   Temp:  (!) 97 F (36.1 C) 97.7 F (36.5 C) 97.9 F (36.6 C)  TempSrc:  Temporal Oral   SpO2:  95% 100% 100%  Weight:  92 kg    Height:        Exam: Gen: NAD, comfortable appearing CVS: Pulse regular rhythm, normal rate, S1 and S2 normal Resp: Clear to auscultation bilaterally, no rales/rhonchi Abd: Soft, obese, nontender, bowel sounds normal Ext: Status post left below-knee amputation with clean dressing Neuro: awake, alert Dialysis access: LUE AVF +b/t    Recent Labs  Lab 05/18/21 1420 05/21/21 2049  K 4.5 3.7  BUN 76* 64*  CREATININE 8.56* 7.03*  ALBUMIN 3.4* 3.7  CALCIUM 9.0 9.2  PHOS 5.3* 4.2   Inpatient medications:  acetaminophen  1,000 mg Oral TID   allopurinol  200 mg Oral Daily   amitriptyline  10 mg Oral QHS   vitamin C  1,000 mg Oral Daily   bisacodyl  10 mg Rectal Once   calcitRIOL  0.5 mcg Oral Daily   carvedilol  25 mg Oral BID WC   darbepoetin (ARANESP) injection - DIALYSIS  100 mcg Intravenous Q Thu-HD   docusate sodium  100 mg Oral TID   feeding supplement (NEPRO CARB STEADY)  237 mL Oral TID BM   insulin aspart  0-6 Units Subcutaneous TID WC   insulin aspart  2 Units Subcutaneous TID WC   levothyroxine  112 mcg Oral QAC breakfast   losartan  100 mg Oral Daily   nutrition supplement (JUVEN)  1 packet Oral BID BM   pantoprazole  40 mg Oral Daily   polyvinyl alcohol  1-2 drop Both Eyes Daily   pregabalin  150 mg Oral Daily   pregabalin  300 mg Oral QHS   rosuvastatin  10 mg Oral Daily   Semaglutide(0.25 or 0.5MG /DOS)  0.5 mg Subcutaneous Q7 days   senna  2 tablet Oral QHS   sevelamer carbonate   800 mg Oral TID WC   Vitamin D (Ergocalciferol)  50,000 Units Oral Q7 days    phenol, polyethylene glycol, potassium chloride   OP HD: NxStage HHD 4x/ week   2K lactate 45 AVF w/ buttonholes  15g    89kg  Hep none  - mircera 100 mcg SQ last given 1/26 > due 2/09  Assessment/ Plan: SP L BKA - for nonhealing diabetic foot ulcer/osteomyelitis, done by Dr. [name] on 05/09/21.  in CIR  ESRD: Usually 4d/week at home, plan MWF schedule here. Next HD tomorrow, can resume HHD upon d/c Hypertension/volume: BP good on Losartan and Coreg. 3kg up today, mild edema on exam. Cont to lower vol w/ next HD.   Anemia: Hgb 8.5- 10 here. Esa due on 2/09 > ordered darbe 100 mcg weekly on 2/9  Metabolic bone disease: Calcium and phosphorus levels currently at goal. Continue calcitriol and renvela.   Nutrition:  on protein supplement.  Diabetes mellitus: on insulin, management per primary team  Gean Quint, MD [facility]

## 2021-05-22 NOTE — Progress Notes (Signed)
Occupational Therapy Discharge Summary  Patient Details  Name: IRVEN INGALSBE MRN: [id number] Date of Birth: [birth year]  Patient has met 8 of 9 long term goals due to improved activity tolerance, improved balance, postural control, and ability to compensate for deficits.  Patient to discharge at overall supervision for self-care and CGA for functional transfers.  Patient's care partner is independent to provide the necessary physical assistance at discharge.    Reasons goals not met: pt did not meet sit > stand at supervision level due to decreased balance and strength, however able to complete at [facility] level  Recommendation:  Patient will benefit from ongoing skilled OT services in home health setting to continue to advance functional skills in the area of BADL, iADL, and Reduce care partner burden.  Equipment: Drop arm BSC, TTB  Reasons for discharge: treatment goals met  Patient/family agrees with progress made and goals achieved: Yes  OT Discharge Precautions/Restrictions  Precautions Precautions: Fall Required Braces or Orthoses: Other Brace Other Brace: limb protector L LE, AFO with shoe component to R LE Restrictions Weight Bearing Restrictions: Yes LLE Weight Bearing: Non weight bearing ADL ADL Eating: Independent Where Assessed-Eating: Wheelchair Grooming: Modified independent Where Assessed-Grooming: Sitting at sink Upper Body Bathing: Modified independent Where Assessed-Upper Body Bathing: Sitting at sink Lower Body Bathing: Supervision/safety Where Assessed-Lower Body Bathing: Sitting at sink Upper Body Dressing: Modified independent (Device) Where Assessed-Upper Body Dressing: Sitting at sink Lower Body Dressing: Modified independent Where Assessed-Lower Body Dressing: Bed level Toileting: Supervision/safety Where Assessed-Toileting: Bedside Commode Toilet Transfer: Therapist, music Method: Engineer, water: Research officer, political party: Not assessed Social research officer, government: Curator Method: Education officer, environmental: Gaffer Baseline Vision/History: 1 Wears glasses Patient Visual Report: No change from baseline Perception  Perception: Within Functional Limits Praxis Praxis: Intact Cognition Overall Cognitive Status: Within Functional Limits for tasks assessed Arousal/Alertness: Awake/alert Orientation Level: Oriented X4 Year: 2024 Month: April Day of Week: Incorrect Immediate Memory Recall: Sock;Bed;Blue Memory Recall Sock: Without Cue Memory Recall Blue: Without Cue Memory Recall Bed: Without Cue Awareness: Appears intact Problem Solving: Appears intact Safety/Judgment: Impaired Sensation Sensation Light Touch: Appears Intact Coordination Gross Motor Movements are Fluid and Coordinated: No Fine Motor Movements are Fluid and Coordinated: No Finger Nose Finger Test: Decreased speed, mild tremors bilaterally Motor  Motor Motor: Abnormal tone;Abnormal postural alignment and control Motor - Skilled Clinical Observations: grossly uncoordinated due to R knee varus with ankle PF/inversion and supination contracture, tremors, and altered balance strategies 2/2 L BKA. Mobility  Bed Mobility Bed Mobility: Rolling Right;Rolling Left;Supine to Sit;Sit to Supine Rolling Right: Supervision/verbal cueing Rolling Left: Supervision/Verbal cueing Supine to Sit: Supervision/Verbal cueing Sitting - Scoot to Edge of Bed: Supervision/Verbal cueing Sit to Supine: Supervision/Verbal cueing Transfers Sit to Stand: Contact Guard/Touching assist Stand to Sit: Contact Guard/Touching assist  Trunk/Postural Assessment  Cervical Assessment Cervical Assessment: Exceptions to [facility] (forward head) Thoracic Assessment Thoracic Assessment: Exceptions to [facility] (rounded shoulders) Lumbar Assessment Lumbar Assessment: Exceptions to [facility] (posterior  pelvic tilt) Postural Control Postural Control: Deficits on evaluation Trunk Control: tendency to sit EOB or EOM leaning on LUE for comfort  Balance Balance Balance Assessed: Yes Static Sitting Balance Static Sitting - Balance Support: Feet supported;Bilateral upper extremity supported Static Sitting - Level of Assistance: 6: Modified independent (Device/Increase time) Dynamic Sitting Balance Dynamic Sitting - Balance Support: Feet supported;No upper extremity supported Dynamic Sitting - Level of Assistance: 6: Modified independent (Device/Increase time)  Static Standing Balance Static Standing - Balance Support: Bilateral upper extremity supported (RW) Static Standing - Level of Assistance: 5: Stand by assistance (CGA) Dynamic Standing Balance Dynamic Standing - Balance Support: Bilateral upper extremity supported (RW) Dynamic Standing - Level of Assistance: 5: Stand by assistance (CGA) Extremity/Trunk Assessment RUE Assessment RUE Assessment: Within Functional Limits Active Range of Motion (AROM) Comments: WNL LUE Assessment LUE Assessment: Within Functional Limits Active Range of Motion (AROM) Comments: WNL   Ruvim Risko E Yobani Schertzer 05/22/2021, 3:43 PM

## 2021-05-22 NOTE — Progress Notes (Signed)
Patient ID: [name], male   DOB: February 03, 1951, 71 y.o.   MRN: [id number]    SW made attempt to reach out to patient spouse requested [facility] agency: Contractor. SW left a VM and sent a inquiry e-mail to the contract information located.

## 2021-05-22 NOTE — Progress Notes (Signed)
Occupational Therapy Weekly Progress Note  Patient Details  Name: ABDULLAHI [name] MRN: [id number] Date of Birth: 25-Feb-1951  Beginning of progress report period: May 12, 2021 End of progress report period: May 22, 2021   Patient has met 3 of 3 short term goals. Pt is making steady progress towards LTGs. Pt has progressed functional transfers from max +2 assist with stedy to CGA squat pivot to [facility], is now able to bathe at an overall supervision vs max A level, dress at an overall min A vs max A level and requires min assist for toileting tasks. Pt continues to be demonstrate good therapy participation, however decreased insight to deficits with lots of education needed, decreased balance, strength and core control. Pt and pt's wife participated in family education yesterday, with good demonstration and hands on training with shower/bed/wheelchair transfers via squat pivot.  Patient continues to demonstrate the following deficits: muscle weakness and muscle joint tightness, decreased cardiorespiratoy endurance, and decreased coordination and therefore will continue to benefit from skilled OT intervention to enhance overall performance with BADL and Reduce care partner burden.  Patient progressing toward long term goals..  Continue plan of care.  OT Short Term Goals Week 1:  OT Short Term Goal 1 (Week 1): Pt will complete 1/3 components of toileting with CGA OT Short Term Goal 1 - Progress (Week 1): Met OT Short Term Goal 2 (Week 1): Pt will complete LB dressing at sit<stand level with 1 assist OT Short Term Goal 2 - Progress (Week 1): Met OT Short Term Goal 3 (Week 1): Pt will complete UB self care EOB with no more than supervision assist for sitting balance OT Short Term Goal 3 - Progress (Week 1): Met Week 2:  OT Short Term Goal 1 (Week 2): STG=LTG due to [facility] [name] 05/22/2021, 7:31 AM

## 2021-05-22 NOTE — Progress Notes (Signed)
Inpatient Rehabilitation Discharge Medication Review by a Pharmacist   A complete drug regimen review was completed for this patient to identify any potential clinically significant medication issues.   High Risk Drug Classes Is patient taking? Indication by Medication  Antipsychotic No    Anticoagulant No    Antibiotic No    Opioid No   Antiplatelet No    Hypoglycemics/insulin Yes Semaglutide, scheduled insulin- T2DM  Vasoactive Medication Yes Coreg, cozaar- hypertension  Chemotherapy No    Other Yes Protonix- GERD Allopurinol- gout Calcitriol- secondary hyperparathyroidism Synthroid- hypothyroidism Crestor- HLD Pregabalin- neuropathic pain Aranesp - anemia Renvela - phos binder        Type of Medication Issue Identified Description of Issue Recommendation(s)  Drug Interaction(s) (clinically significant)        Duplicate Therapy        Allergy        No Medication Administration End Date        Incorrect Dose        Additional Drug Therapy Needed        Significant med changes from prior encounter (inform family/care partners about these prior to discharge).      Other          Clinically significant medication issues were identified that warrant physician communication and completion of prescribed/recommended actions by midnight of the next day:  No   Name of provider notified for urgent issues identified:    Provider Method of Notification:        Pharmacist comments:    Time spent performing this drug regimen review (minutes):  [place], PharmD, [place], AAHIVP, CPP Infectious Disease Pharmacist 05/22/2021 8:34 AM

## 2021-05-22 NOTE — Progress Notes (Signed)
PROGRESS NOTE   Subjective/Complaints: No new complaints this morning Pain is well controlled Sleeping well at night Moving bowels regularly.   ROS:   Pt denies SOB, abd pain, CP, N/V/C/D, and vision changes, +residual limb pain, +phantom limb pain  Objective:   No results found. Recent Labs    05/21/21 2049  WBC 7.0  HGB 10.2*  HCT 30.1*  PLT 184    Recent Labs    05/21/21 2049  NA 135  K 3.7  CL 95*  CO2 25  GLUCOSE 148*  BUN 64*  CREATININE 7.03*  CALCIUM 9.2     Intake/Output Summary (Last 24 hours) at 05/22/2021 0839 Last data filed at 05/22/2021 0744 Gross per 24 hour  Intake 836 ml  Output 2851 ml  Net -2015 ml        Physical Exam: Vital Signs Blood pressure 140/60, pulse 65, temperature 97.9 F (36.6 C), resp. rate 18, height 5\' 11"  (1.803 m), weight 92 kg, SpO2 100 %.  Gen: no distress, normal appearing HEENT: oral mucosa pink and moist, NCAT Cardio: Reg rate Chest: normal effort, normal rate of breathing Abd: soft, non-distended Ext: no edema Psych: pleasant, normal affect Neurological: Ox3- MS: L BKA floating on pillow- tip not touching anything Skin:    Comments: Left BKA dressed appropriately tender. Has shrinker in place- some dried blood- and well as popped blister at tip- some bleeding seen- trace Neurological:     Comments: Patient is alert.  Makes eye contact with examiner.  Oriented x3    Assessment/Plan: 1. Functional deficits which require 3+ hours per day of interdisciplinary therapy in a comprehensive inpatient rehab setting. Physiatrist is providing close team supervision and 24 hour management of active medical problems listed below. Physiatrist and rehab team continue to assess barriers to discharge/monitor patient progress toward functional and medical goals  Care Tool:  Bathing    Body parts bathed by patient: Left arm, Right arm, Chest, Abdomen, Front  perineal area, Face, Right lower leg, Right upper leg, Buttocks, Left upper leg   Body parts bathed by helper: Buttocks Body parts n/a: Left lower leg   Bathing assist Assist Level: Contact Guard/Touching assist (sitting on shower bench)     Upper Body Dressing/Undressing Upper body dressing   What is the patient wearing?: Pull over shirt    Upper body assist Assist Level: Set up assist    Lower Body Dressing/Undressing Lower body dressing      What is the patient wearing?: Ace wrap/stump shrinker     Lower body assist Assist for lower body dressing: Minimal Assistance - Patient > 75%     Toileting Toileting Toileting Activity did not occur (Clothing management and hygiene only): N/A (no void or bm)  Toileting assist Assist for toileting: Maximal Assistance - Patient 25 - 49% (sit to stand in the Page)     Transfers Chair/bed transfer  Transfers assist     Chair/bed transfer assist level: Contact Guard/Touching assist     Locomotion Ambulation   Ambulation assist   Ambulation activity did not occur: Safety/medical concerns (Poor motor planning, LLE NWB, Orthotic shoe on RLE, pain)  Walk 10 feet activity   Assist  Walk 10 feet activity did not occur: Safety/medical concerns (Poor motor planning, LLE NWB, Orthotic shoe on RLE, pain)        Walk 50 feet activity   Assist Walk 50 feet with 2 turns activity did not occur: Safety/medical concerns (Poor motor planning, LLE NWB, Orthotic shoe on RLE, pain)         Walk 150 feet activity   Assist Walk 150 feet activity did not occur: Safety/medical concerns (Poor motor planning, LLE NWB, Orthotic shoe on RLE, pain)         Walk 10 feet on uneven surface  activity   Assist Walk 10 feet on uneven surfaces activity did not occur: Safety/medical concerns (Poor motor planning, LLE NWB, Orthotic shoe on RLE, pain)         Wheelchair     Assist Is the patient using a wheelchair?:  Yes Type of Wheelchair: Manual    Wheelchair assist level: Supervision/Verbal cueing Max wheelchair distance: 166ft    Wheelchair 50 feet with 2 turns activity    Assist        Assist Level: Supervision/Verbal cueing   Wheelchair 150 feet activity     Assist      Assist Level: Supervision/Verbal cueing   Blood pressure 140/60, pulse 65, temperature 97.9 F (36.6 C), resp. rate 18, height 5\' 11"  (1.803 m), weight 92 kg, SpO2 100 %. Medical Problem List and Plan: 1. Functional deficits secondary to left BKA 05/09/2021 secondary to peripheral vascular disease poor healing of recent left fourth and fifth ray amputation             -patient may shower but incision must be covered             -ELOS/Goals: 8-12 days S       Continue CIR- PT, OT -   Messaged April to schedule hospital follow-up  No limb guard- use shrinker and dressings OVER shrinker, per Dr [name].  2.  Antithrombotics: -DVT/anticoagulation:  Mechanical: Antiembolism stockings, thigh (TED hose) Right lower extremity             -antiplatelet therapy: N/A 3. Postoperative pain: d/c opioid medications due to sedation. Lyrica 150 mg daily and 300 mg nightly, oxycodone as needed. Schedule Tylenol 1000mg  TID, start 1,000mg  vitamin C daily. LFTs reviewed and stable. Add Amitriptyline 10mg  HS. D/c tramadol as per wife's request since making him drowsy  2/11- pain better controlled today- will con't current regimen and titrate as required. 2/12- really likes tramadol for pain control- con't regimen  4. Anxiety: Provide emotional support. Continue amitriptyline 10mg  HS.  5. Neuropsych: This patient is capable of making decisions on his own behalf. 6. Skin/Wound Care: Routine skin checks 7. Hyponatremia: resolved, continue to monitor Na with HD labs.  8.  End-stage renal disease.  Continue hemodialysis as per renal services. Diet changed to regular as per patient request.  9.  Acute on chronic anemia.  Hgb improving,  monitor w/ HD labs.  10.  Hypertension.  Continue Cozaar 100 mg daily, Increase Coreg to 25 mg twice daily.  Monitor with increased mobility 11.  Diabetes mellitus with peripheral neuropathy.  Hemoglobin A1c 5.5.  NovoLog 2 units 3 times daily. Discussed outpatient Qutenza and he is very interested, messaged April to schedule this.   2/11- CBGs 95-164- 1 value above 150- con't regimen  2/12- CBGs 97-160- con't regimen 12.  Hypothyroidism.  Continue Synthroid 13.  Hyperlipidemia.  Crestor  14.  History of gout.  Continue allopurinol.  Monitor for any gout flareups 15.  History of atrial flutter with ablation 04/06/2020.  Follow-up cardiology services as needed.  Cardiac rate controlled 16.  History of prostate cancer status post TURP 06/28/2020.  Follow-up outpatient 17.  Obesity.  BMI 29.99.  Dietary follow-up 18.  GERD.  Protonix 19. Sedation: hold opioid medications 20. Vitamin D deficiency: continue ergocalciferol 50,000U once per week for 7 weeks. 21. Constipation: continue colace to 100mg  TID and add Senna 2 tabs HS. Discussed increasing bowel regimen but he defers at this time 22. Liver protection while on Tylenol: discussed benefits of NAC-will have to give outpatient as hospital does not allow supplements not approved by FDA, discussed with pharmacy 23. L BKA  2/11- will stop limb guard- also start Nepro per Dr [name] BID- since ESRD and DM.     LOS: 11 days A FACE TO FACE EVALUATION WAS PERFORMED  [name] 05/22/2021, 8:39 AM

## 2021-05-22 NOTE — Progress Notes (Signed)
Occupational Therapy Session Note  Patient Details  Name: [name] MRN: [id number] Date of Birth: [birth year]  Today's Date: 05/22/2021 OT Individual Time: 1125-1225 OT Individual Time Calculation (min): 60 min    Short Term Goals: Week 1:  OT Short Term Goal 1 (Week 1): Pt will complete 1/3 components of toileting with CGA OT Short Term Goal 1 - Progress (Week 1): Met OT Short Term Goal 2 (Week 1): Pt will complete LB dressing at sit<stand level with 1 assist OT Short Term Goal 2 - Progress (Week 1): Met OT Short Term Goal 3 (Week 1): Pt will complete UB self care EOB with no more than supervision assist for sitting balance OT Short Term Goal 3 - Progress (Week 1): Met Week 2:  OT Short Term Goal 1 (Week 2): STG=LTG due to ELOS   Skilled Therapeutic Interventions/Progress Updates:    Pt sitting up in w/c, requesting to work on strength, endurance, and stretches for residual limb.  No c/o pain at beginning of session.  Noted pts residual limb resting in flexed position on leg rest support.  Pt propelled to large gym using BUE with supervision.  Pt reports propulsion hurts bilateral shoulders, however postured with significant posterior pelvic tilt and kyphosis/rounded shoulders.  Educated pt on repositioning to achieve neutral pelvis, and improve shoulder posture.  Fair carryover demonstrated likely due to weakness in postural musculature.  Applied two folded towels and removed pillow from leg rest to position left knee into extension.  Pt completed squat pivot to EOM with CGA.  Sit to supine then rolled to prone with CGA.  Pt participated in 5 minute prolonged gravity assisted stretch with moist hot pack applied simulatneously to increased soft tissue pliability.  Pt returned to sitting EOM with CGA and completed 2 x 12 reps bilateral shoulder scaption using 3 lb free weight and lat pull down using medium resistive band and weightless dowel.  Squat pivot back to w/c with CGA and transported  back to room.  Cold pack applied to posterior knee due to soreness reported after stretching.  Call bell in reach, lunch tray setup provided.    Therapy Documentation Precautions:  Precautions Precautions: Fall Precaution Comments: wound vac L LE Required Braces or Orthoses: Other Brace Other Brace: limb protector L LE, AFO with shoe component to R LE Restrictions Weight Bearing Restrictions: Yes LLE Weight Bearing: Non weight bearing    Therapy/Group: Individual Therapy  Ezekiel Slocumb 05/22/2021, 4:03 PM

## 2021-05-22 NOTE — Telephone Encounter (Signed)
LMOM for pt to return call to get sch'd for post op appt with Dr. [name] on 05/29/21- pt discharged from hospital after surg 05/09/21

## 2021-05-22 NOTE — Progress Notes (Signed)
Occupational Therapy Session Note  Patient Details  Name: [name] MRN: [id number] Date of Birth: Jan 27, 1951  Today's Date: 05/22/2021 OT Individual Time: 8242-3536 OT Individual Time Calculation (min): 55 min    Short Term Goals: Week 2:  OT Short Term Goal 1 (Week 2): STG=LTG due to ELOS  Skilled Therapeutic Interventions/Progress Updates:  Skilled OT intervention completed with focus on ADL retraining, functional transfers, limb care education. Pt received upright in bed, requesting to complete self-care this session. Pt completed bed mobility with supervision, donned AFO/shoe with min A, then all transfers completed throughout session as squat pivot transfer <> w/c and bed with CGA. Pt completed UB bathing, dressing and grooming seated at sink with mod I. Education provided to pt on washing his shrinker, with pt doffing current one with supervision and cues needed to be easy with pulling due to staples.Therapist educated on skin hygiene around the incision, with therapist cleaning with total A due to dry/flaky skin. Pt able to donn new shrinker with min A, as a team to prevent pulling of staples and encouraged pt to use this method with his wife for increased comfort. Pt preferring to complete LB dressing at bed level with pt able to at supervision level. Pt was left seated in w/c, with chair alarm on and all needs in reach at end session.   Therapy Documentation Precautions:  Precautions Precautions: Fall Precaution Comments: wound vac L LE Required Braces or Orthoses: Other Brace Other Brace: limb protector L LE, AFO with shoe component to R LE Restrictions Weight Bearing Restrictions: Yes LLE Weight Bearing: Non weight bearing  Pain: Unrated pain in residual limb, improved with positioning   Therapy/Group: Individual Therapy  [name] 05/22/2021, 7:25 AM

## 2021-05-22 NOTE — Progress Notes (Signed)
Physical Therapy Weekly Progress Note  Patient Details  Name: [name] MRN: [id number] Date of Birth: 09-30-1950  Beginning of progress report period: May 12, 2021 End of progress report period: May 22, 2021  Today's Date: 05/22/2021 PT Individual Time: 1300-1410 PT Individual Time Calculation (min): 70 min   Patient has met 2 of 3 short term goals. Pt demonstrates steady progress towards long term goals. Pt is currently able to perform bed mobility from flat surfaces without rails and supervision with increased time. Pt has progressed to performing squat<>pivots and sit<>stands transfers with CGA and WC mobility >161f using BUE and supervision. Pt is unable to perform stand<>pivots or ambulate due to condition of RLE. Pt uses AFO on RLE but when standing RLE is positioned in knee varus with ankle supination with mild PF/inversion contracture, posing safety and fall risk. Pt continues to be limited by phantom limb pain, generalized weakness, and decreased balance strategies. Family education with wife completed on 2/13.   Patient continues to demonstrate the following deficits muscle weakness and muscle joint tightness, decreased cardiorespiratoy endurance, abnormal tone, and decreased standing balance, decreased postural control, decreased balance strategies, and difficulty maintaining precautions and therefore will continue to benefit from skilled PT intervention to increase functional independence with mobility.  Patient progressing toward long term goals..  Continue plan of care.  PT Short Term Goals Week 1:  PT Short Term Goal 1 (Week 1): Pt will perform sit <>stand w/LRAD and mod A PT Short Term Goal 1 - Progress (Week 1): Met PT Short Term Goal 2 (Week 1): Pt will perform bed <>chair transfer w/LRAD and mod A PT Short Term Goal 2 - Progress (Week 1): Met PT Short Term Goal 3 (Week 1): Pt will initiate gait training w/mod A and LRAD PT Short Term Goal 3 - Progress (Week 1):  Not met Week 2:  PT Short Term Goal 1 (Week 2): STG=LTG due to LOS  Skilled Therapeutic Interventions/Progress Updates:  Ambulation/gait training;Community reintegration;DME/adaptive equipment instruction;Neuromuscular re-education;Psychosocial support;UE/LE Strength taining/ROM;Wheelchair propulsion/positioning;Balance/vestibular training;Discharge planning;Pain management;Skin care/wound management;Therapeutic Activities;UE/LE Coordination activities;Cognitive remediation/compensation;Disease management/prevention;Functional mobility training;Patient/family education;Splinting/orthotics;Therapeutic Exercise;Visual/perceptual remediation/compensation   Today's Interventions: Received pt sitting in WC, pt agreeable to PT treatment, and reported pain 6/10 in L residual limb (premedicated). Session with emphasis on functional mobility/transfers, generalized strengthening and ROM, contracture prevention, dynamic standing balance/coordination, and improved activity tolerance. Pt performed WC mobility 1244fusing BUE and supervision and transported remainder of way to main therapy gym due to fatigue. Pt transferred WC<>mat squat<>pivot with CGA and performed seated L knee extensions 2x10. Then transferred sit<>stand with RW and CGA x 4 trials and performed standing L hip abduction 2x10 and standing L hip flexion with knee extended 2x10. Of note, pt unable to tolerate L residual limb being in dependent position, therefore placed WC in front of pt to rest residual limb on in between exercises. Sit<>prone with supervision and performed the following exercises with emphasis on stretching hip flexors: -prone R hamstring curls 2x10 -prone<>prone on elbows 2x7 with 10 second isometric hold  Prone<>R sidelying with supervision and continued with the following exercises: -R sidelying L hip extension 2x10 -R sidelying L hip abduction 2x8 - pt compensating with hip flexion despite verbal/tactile cues.  R  sidelying<>supine with supervision and performed R single leg bridges 2x8. Supine<>sitting EOM with supervision and squat<>pivot mat<>WC with CGA and [facility]. Briefly discussed prosthetic fitting process and timeline. Concluded session with pt sitting in  WC with all needs within reach.   Therapy Documentation Precautions:  Precautions Precautions: Fall Precaution Comments: wound vac L LE Required Braces or Orthoses: Other Brace Other Brace: limb protector L LE, AFO with shoe component to R LE Restrictions Weight Bearing Restrictions: Yes LLE Weight Bearing: Non weight bearing  Therapy/Group: Individual Therapy Alfonse Alpers PT, DPT  05/22/2021, 7:40 AM

## 2021-05-22 NOTE — Progress Notes (Signed)
Advised by MD that pt wishes to receive HD here tomorrow and d/c on Thursday. Update provided to CSW in the event staff was not aware of this information. Pt received home HD prior to admission and will resume at d/c. Will assist as needed.   Melven Sartorius Renal Navigator [phone]

## 2021-05-23 ENCOUNTER — Other Ambulatory Visit (HOSPITAL_COMMUNITY): Payer: Self-pay

## 2021-05-23 LAB — RENAL FUNCTION PANEL
Albumin: 3.7 g/dL (ref 3.5–5.0)
Anion gap: 16 — ABNORMAL HIGH (ref 5–15)
BUN: 105 mg/dL — ABNORMAL HIGH (ref 8–23)
CO2: 25 mmol/L (ref 22–32)
Calcium: 9.9 mg/dL (ref 8.9–10.3)
Chloride: 94 mmol/L — ABNORMAL LOW (ref 98–111)
Creatinine, Ser: 10.68 mg/dL — ABNORMAL HIGH (ref 0.61–1.24)
GFR, Estimated: 5 mL/min — ABNORMAL LOW (ref >60)
Glucose, Bld: 122 mg/dL — ABNORMAL HIGH (ref 70–99)
Phosphorus: 6.6 mg/dL — ABNORMAL HIGH (ref 2.5–4.6)
Potassium: 5 mmol/L (ref 3.5–5.1)
Sodium: 135 mmol/L (ref 135–145)

## 2021-05-23 LAB — CBC
HCT: 29.8 % — ABNORMAL LOW (ref 39.0–52.0)
Hemoglobin: 9.6 g/dL — ABNORMAL LOW (ref 13.0–17.0)
MCH: 29.4 pg (ref 26.0–34.0)
MCHC: 32.2 g/dL (ref 30.0–36.0)
MCV: 91.1 fL (ref 80.0–100.0)
Platelets: 168 10*3/uL (ref 150–400)
RBC: 3.27 MIL/uL — ABNORMAL LOW (ref 4.22–5.81)
RDW: 16.1 % — ABNORMAL HIGH (ref 11.5–15.5)
WBC: 7.7 10*3/uL (ref 4.0–10.5)
nRBC: 0 % (ref 0.0–0.2)

## 2021-05-23 LAB — GLUCOSE, CAPILLARY
Glucose-Capillary: 107 mg/dL — ABNORMAL HIGH (ref 70–99)
Glucose-Capillary: 105 mg/dL — ABNORMAL HIGH (ref 70–99)
Glucose-Capillary: 107 mg/dL — ABNORMAL HIGH (ref 70–99)
Glucose-Capillary: 161 mg/dL — ABNORMAL HIGH (ref 70–99)

## 2021-05-23 MED ORDER — METHOCARBAMOL 500 MG PO TABS
500.0000 mg | ORAL_TABLET | Freq: Three times a day (TID) | ORAL | Status: DC | PRN
  Administered 2021-05-23 – 2021-05-24 (×3): 500 mg via ORAL
  Filled 2021-05-23 (×3): qty 1

## 2021-05-23 MED ORDER — AMITRIPTYLINE HCL 25 MG PO TABS
25.0000 mg | ORAL_TABLET | Freq: Every day | ORAL | 0 refills | Status: DC
  Filled 2021-05-23: qty 30, 30d supply, fill #0

## 2021-05-23 MED ORDER — METHOCARBAMOL 500 MG PO TABS
500.0000 mg | ORAL_TABLET | Freq: Three times a day (TID) | ORAL | 0 refills | Status: DC | PRN
Start: 2021-05-23 — End: 2021-06-22
  Filled 2021-05-23: qty 60, 20d supply, fill #0

## 2021-05-23 MED ORDER — AMITRIPTYLINE HCL 50 MG PO TABS
25.0000 mg | ORAL_TABLET | Freq: Every day | ORAL | Status: DC
  Administered 2021-05-23: 25 mg via ORAL
  Filled 2021-05-23: qty 1

## 2021-05-23 NOTE — Progress Notes (Signed)
Physical Therapy Session Note  Patient Details  Name: [name] MRN: [id number] Date of Birth: Nov 06, 1950  Today's Date: 05/23/2021 PT Individual Time: 1056-1201 PT Individual Time Calculation (min): 65 min   Short Term Goals: Week 2:  PT Short Term Goal 1 (Week 2): STG=LTG due to LOS  Skilled Therapeutic Interventions/Progress Updates: Pt presented in w/c agreeable to therapy. Pt states pain 3/10 at rest, increased with mobility. Rest breaks provided as needed for pain management. Pt transported to rehab gym for energy conservation. Performed squat pivot transfer CGA to mat. Participated in supine LLE therex as noted below. Rest breaks provided due to pain and fatigue.   All performed 2 x 10 LLE QS SLR - emphasis on knee extension Sidelying hip Abd/add Sidelying hip extension Sidelying hip ER with red theraband Prone hip flexion stretch ~5 min Supine modified bridges with bolster  Pt demonstrated rolling L/R prone to supine with supervision and increased time. Pt set up and performed squat pivot to w/c with CGA however noted significantly close to edge of w/c  with PTA providing heavy guarding to block pt potentially slipping off w/c. Pt aware but stated he was better positioned in w/c than appeared. Pt was able to propel back to room with supervision and increased time. Pt lef tin w/c at end of session with call bell within reach and needs met.      Therapy Documentation Precautions:  Precautions Precautions: Fall Precaution Comments: wound vac L LE Required Braces or Orthoses: Other Brace Other Brace: limb protector L LE, AFO with shoe component to R LE Restrictions Weight Bearing Restrictions: Yes LLE Weight Bearing: Non weight bearing General: PT Amount of Missed Time (min): 10 Minutes PT Missed Treatment Reason: Other (Comment)   Therapy/Group: Individual Therapy  Vincent Ehrler Adysson Revelle, PTA  05/23/2021, 12:46 PM

## 2021-05-23 NOTE — Patient Care Conference (Signed)
Inpatient RehabilitationTeam Conference and Plan of Care Update Date: 05/23/2021   Time: 11:31 AM    Patient Name: [name]      Medical Record Number: [id number]  Date of Birth: 04-25-1950 Sex: Male         Room/Bed: 4M06C/4M06C-01 Payor Info: Payor: MEDICARE / Plan: MEDICARE PART A AND B / Product Type: *No Product type* /    Admit Date/Time:  05/11/2021  3:08 PM  Primary Diagnosis:  Left below-knee amputee [facility])  Hospital Problems: Principal Problem:   Left below-knee amputee [facility])    Expected Discharge Date: Expected Discharge Date: 05/24/21  Team Members Present: Physician leading conference: Dr. Leeroy Cha Social Worker Present: Erlene Quan, [place] Nurse Present: Dorthula Nettles, RN PT Present: Becky Sax, PT OT Present: Other (comment) [facility] Augusto Garbe, OT) [place] Coordinator present : Gunnar Fusi, SLP     Current Status/Progress Goal Weekly Team Focus  Bowel/Bladder   Pt is continent of B/B. LBM: 05/22/2021  remain cont  frequent toileting Q shift and PRN   Swallow/Nutrition/ Hydration             ADL's   supervision- seated bathing in shower, supervision LB dressing, min A toileting, squat pivots with CGA/stands with min A  supervision self-care, CGA transfers  functional transfers, core strengthening, toileting/LB dressing independence   Mobility   bed mobility supervision, squat<>pivot transfers CGA, sit<>stands CGA with RW, unable to ambulate at this time  CGA, Mod I WC mobility  functional mobility/transfers, generalized strengthening and ROM, amputee education, dynamic standing balance, D/C planning   Communication             Safety/Cognition/ Behavioral Observations            Pain   Pt rates pain of 3/10, phantom pain  pt denies any pain  assess pain q shift and PRN   Skin   Pt has L BKA with staples intact, a blister that has opened on LLE, a stage 2 (looks like skin tear) on L buttock cheek, scabbing in mid coccyx  No further skin  breakdown  assess skin q shift and PRN     Discharge Planning:  D/c home on Thursday.   Team Discussion: Muscle spasms, added Robaxin. Post op pain, right foot neuro/phantom pain. BP labile. Continent B/B, left BKA with staples, scabbing to mid coccyx, and open blister to LLE. Treating appropriately. Discharging on Thursday, HHPT only. Received slide board. Spouse to look into Home Care services as well.  Patient on target to meet rehab goals: yes, all goals met. Family education completed with spouse. Spouse has removed bathroom door, patient's WC will now fit through the doorway.  *See Care Plan and progress notes for long and short-term goals.   Revisions to Treatment Plan:  Finalizing discharge medications and plans.   Teaching Needs: Family education completed.   Current Barriers to Discharge: All barriers addressed.  Possible Resolutions to Barriers: N/A     Medical Summary Current Status: muscle spasms, postoperative pain, right foot neuropathic pain, residual limb, labile blood pressure, hemodialysis, insomnia, phantom limb  Barriers to Discharge: Medical stability;Hemodialysis  Barriers to Discharge Comments: muscle spasms, postoperative pain, right foot neuropathic pain, residual limb, labile blood pressure, hemodialysis, insomnia, phantom limb Possible Resolutions to [employer] Focus: continue tobaxin, continue scheduled tylenol, discussed outpatient Qutenza, continue to monitor BP TID, increase amitriptyline to 28m   Continued Need for Acute Rehabilitation Level of Care: The patient requires daily medical management by a physician with specialized  training in physical medicine and rehabilitation for the following reasons: Direction of a multidisciplinary physical rehabilitation program to maximize functional independence : Yes Medical management of patient stability for increased activity during participation in an intensive rehabilitation regime.: Yes Analysis  of laboratory values and/or radiology reports with any subsequent need for medication adjustment and/or medical intervention. : Yes   I attest that I was present, lead the team conference, and concur with the assessment and plan of the team.   Cristi Loron 05/23/2021, 11:47 AM

## 2021-05-23 NOTE — Progress Notes (Signed)
Occupational Therapy Session Note  Patient Details  Name: [name] MRN: [id number] Date of Birth: 1951/03/11  Today's Date: 05/23/2021 OT Individual Time: 2706-2376 OT Individual Time Calculation (min): 54 min    Short Term Goals: Week 2:  OT Short Term Goal 1 (Week 2): STG=LTG due to ELOS  Skilled Therapeutic Interventions/Progress Updates:  Skilled OT intervention completed with focus on d/c planning, discussion of rehab goals, and POC, toilet transfers, home management education, BUE endurance. Pt received seated in w/c, agreeable to session. Pt donned/doffed limb guard with mod I. Pt participated in mass practice toilet transfers via squat pivot w/c <> DABSC, with pt using bed rail to pull up per pt's bathroom environment with grab bar/toilet set up. Pt initially with min A as he tried standing more than squatting, however fading assist to CGA, with cues needed to control descent to [facility]. Education provided on w/c mobility for positioning in tight space for prepping to transfer. Pt required several trials as he positioned too far away or far gap, with cues needed for safe w/c positioning however pt able to complete simulated trial with supervision by end of practice. Educated on Stevenson role to help assist with positioning and further education once home for increased safety. Pt participated in BUE endurance via w/c mobility for education on home/community management education. Pt abel to self-propel about 150 ft before fatigue, with second trial at <100 ft. Pt was left seated in w/c, with chair alarm on and all needs in reach at end of session.   Therapy Documentation Precautions:  Precautions Precautions: Fall Precaution Comments: wound vac L LE Required Braces or Orthoses: Other Brace Other Brace: limb protector L LE, AFO with shoe component to R LE Restrictions Weight Bearing Restrictions: Yes LLE Weight Bearing: Non weight bearing  Pain: Un-rated shooting pain in residual limb,  nursing aware- premedicated   Therapy/Group: Individual Therapy  Brysyn Brandenberger E Misbah Hornaday 05/23/2021, 7:48 AM

## 2021-05-23 NOTE — Progress Notes (Signed)
Physical Therapy Discharge Summary  Patient Details  Name: KINGJAMES COURY MRN: [id number] Date of Birth: 04-17-1950  Today's Date: 05/23/2021 PT Individual Time: 0800-0910 PT Individual Time Calculation (min): 70 min   Patient has met 6 of 6 long term goals due to improved activity tolerance, improved balance, improved postural control, increased strength, increased range of motion, ability to compensate for deficits, and improved coordination.  Patient to discharge at a wheelchair level  CGA .  Patient's care partner is independent to provide the necessary physical assistance at discharge. Pt's wife attended family education training on 2/11 and verbalized and demonstrated confidence with all tasks to ensure safe discharge home.   All goals met   Recommendation:  Patient will benefit from ongoing skilled PT services in home health setting to continue to advance safe functional mobility, address ongoing impairments in transfers, generalized strengthening and ROM, dynamic standing balance/coordination, amputee education, endurance, and to minimize fall risk.  Equipment: slideboard ; pt already has WC  Reasons for discharge: treatment goals met  Patient/family agrees with progress made and goals achieved: Yes  Today's Interventions: Received pt semi-reclined in bed, pt agreeable to PT treatment, and reported pain 5/10 in L residual limb (premedicated). Session with emphasis on discharge planning, functional mobility/transfers, generalized strengthening and ROM, dynamic standing balance/coordination, and improved endurance with activity. Pt transferred semi-reclined<>sitting EOB with HOB elevated and supervision and transferred bed<>WC squat<>pivot with CGA. Pt performed WC mobility 132f using BUE and mod I and transported remainder of way [facility] to [facility]. Pt performed RLE strengthening on Kinetron at 20 cm/sec for 1 minute x 1 trial increasing to 10 cm/sec for 1 minute x 3  additional trials with therapist providing manual counter resistance with emphasis on glute/quad strength. Pt able to set up transfer to mat with supervision and transferred on/off mat via squat<>pivot with CGA. Pt performed the following exercises with supervision and verbal cues for technique: -single arm bicep curls with 10lb dumbbell 2x12 bilaterally -L knee extensions 2x12 -hip adduction towel squeezes 2x15 -single arm hammer curls with 10lb dumbbell x10 bilaterally Sit<>stand with RW and CGA x 3 trials and worked on dynamic standing balance tossing beanbags using RUE and CGA/min A for balance x 2 trials. Pt with 1 posterior LOB onto mat. MD present for morning rounds - added muscle relaxer to pt's medications. Returned to room and [facility]. Provided pt with fresh ice water.   PT Discharge Precautions/Restrictions Precautions Precautions: Fall Required Braces or Orthoses: Other Brace Other Brace: limb protector L LE, AFO with shoe component to R LE Restrictions Weight Bearing Restrictions: Yes LLE Weight Bearing: Non weight bearing Pain Interference Pain Interference Pain Effect on Sleep: 3. Frequently Pain Interference with Therapy Activities: 1. Rarely or not at all Pain Interference with Day-to-Day Activities: 1. Rarely or not at all Cognition Overall Cognitive Status: Within Functional Limits for tasks assessed Arousal/Alertness: Awake/alert Orientation Level: Oriented X4 Memory: Appears intact Awareness: Appears intact Problem Solving: Appears intact Safety/Judgment: Impaired Comments: requires occasional cues for safety with sqaut<>pivot transfers Sensation Sensation Light Touch: Appears Intact Proprioception: Appears Intact Additional Comments: burning in L residual limb Coordination Gross Motor Movements are Fluid and Coordinated: No Fine Motor Movements are Fluid and Coordinated: No Coordination and  Movement Description: grossly uncoordinated due to R knee varus with ankle PF/inversion and supination contracture, tremors, and altered balance strategies 2/2 L BKA. Finger Nose Finger Test:  Decreased speed, mild tremors LUE>RUE Heel Shin Test: unable to perform bilaterally pt stating "forget it, I can't do it" Motor  Motor Motor: Abnormal tone;Abnormal postural alignment and control Motor - Skilled Clinical Observations: grossly uncoordinated due to R knee varus with ankle PF/inversion and supination contracture, tremors, and altered balance strategies 2/2 L BKA.  Mobility Bed Mobility Bed Mobility: Rolling Right;Rolling Left;Supine to Sit;Sit to Supine Rolling Right: Supervision/verbal cueing Rolling Left: Supervision/Verbal cueing Supine to Sit: Supervision/Verbal cueing Sitting - Scoot to Edge of Bed: Supervision/Verbal cueing Sit to Supine: Supervision/Verbal cueing Transfers Transfers: Sit to Stand;Stand to Sit;Squat Pivot Transfers Sit to Stand: Contact Guard/Touching assist Stand to Sit: Contact Guard/Touching assist Squat Pivot Transfers: Contact Guard/Touching assist Lateral/Scoot Transfers: Contact Guard/Touching assist Transfer (Assistive device): None Locomotion  Gait Ambulation: No Gait Gait: No Stairs / Additional Locomotion Stairs: No Wheelchair Mobility Wheelchair Mobility: Yes Wheelchair Assistance: Independent with Camera operator: Both upper extremities Wheelchair Parts Management: Supervision/cueing Distance: 150f  Trunk/Postural Assessment  Cervical Assessment Cervical Assessment: Exceptions to WC[facility](forward head) Thoracic Assessment Thoracic Assessment: Exceptions to W[facility](rounded shoulders) Lumbar Assessment Lumbar Assessment: Exceptions to WS[facility](posterior pelvic tilt) Postural Control Postural Control: Deficits on evaluation Trunk Control: tendency to sit EOB or EOM leaning on LUE for comfort  Balance Balance Balance  Assessed: Yes Static Sitting Balance Static Sitting - Balance Support: Feet supported;Bilateral upper extremity supported Static Sitting - Level of Assistance: 6: Modified independent (Device/Increase time) Dynamic Sitting Balance Dynamic Sitting - Balance Support: Feet supported;No upper extremity supported Dynamic Sitting - Level of Assistance: 6: Modified independent (Device/Increase time) Static Standing Balance Static Standing - Balance Support: Bilateral upper extremity supported (RW) Static Standing - Level of Assistance: 5: Stand by assistance (CGA) Dynamic Standing Balance Dynamic Standing - Balance Support: Bilateral upper extremity supported (RW) Dynamic Standing - Level of Assistance: 5: Stand by assistance (CGA) Extremity Assessment  RLE Assessment RLE Assessment: Exceptions to WLake [name] Regional Medical CenterRLE Strength Right Hip Flexion: 4/5 Right Hip ABduction: 4/5 Right Hip ADduction: 4/5 Right Knee Flexion: 4/5 Right Knee Extension: 4/5 LLE Assessment LLE Assessment: Exceptions to WMiami Surgical CenterLLE Strength LLE Overall Strength Comments: limited by pain Left Hip Flexion: 4/5 Left Hip ABduction: 4/5 Left Hip ADduction: 4/5 Left Knee Flexion: 4/5 Left Knee Extension: 4/5  Anija Brickner M JLyn HollingsheadPT, DPT  05/23/2021, 7:22 AM

## 2021-05-23 NOTE — Progress Notes (Signed)
[facility] Progress Note   Subjective:   Patient seen and examined at bedside in HD.  Tolerating HD well.  To d/c home tomorrow.  Excited to be going back home.   Objective Vitals:   05/23/21 0800 05/23/21 1310 05/23/21 1325 05/23/21 1333  BP: (!) 135/50 (!) 134/59 132/61 133/60  Pulse: 60 (!) 55 63 (!) 57  Resp:  16    Temp:  97.7 F (36.5 C)    TempSrc:      SpO2:      Weight:  96.9 kg    Height:       Physical Exam General:chronically ill appearing male in NAD Heart:RRR, no mrg Lungs:CTAB, nml WOB Abdomen:soft, NTND Extremities:L BKA, no LE edema Dialysis Access: :LU AVF in use   Filed Weights   05/21/21 1858 05/23/21 0454 05/23/21 1310  Weight: 92 kg 90.8 kg 96.9 kg    Intake/Output Summary (Last 24 hours) at 05/23/2021 1350 Last data filed at 05/23/2021 1309 Gross per 24 hour  Intake 600 ml  Output 150 ml  Net 450 ml    Additional Objective Labs: Basic Metabolic Panel: Recent Labs  Lab 05/18/21 1420 05/21/21 2049 05/23/21 1252  NA 138 135 135  K 4.5 3.7 5.0  CL 100 95* 94*  CO2 22 25 25   GLUCOSE 158* 148* 122*  BUN 76* 64* 105*  CREATININE 8.56* 7.03* 10.68*  CALCIUM 9.0 9.2 9.9  PHOS 5.3* 4.2 6.6*   Liver Function Tests: Recent Labs  Lab 05/18/21 1420 05/21/21 2049 05/23/21 1252  ALBUMIN 3.4* 3.7 3.7    CBC: Recent Labs  Lab 05/18/21 1420 05/21/21 2049 05/23/21 1252  WBC 5.3 7.0 7.7  HGB 8.3* 10.2* 9.6*  HCT 25.1* 30.1* 29.8*  MCV 90.9 90.1 91.1  PLT 144* 184 168   CBG: Recent Labs  Lab 05/22/21 1229 05/22/21 1628 05/22/21 2101 05/23/21 0632 05/23/21 1203  GLUCAP 180* 131* 106* 107* 105*   Medications:   acetaminophen  1,000 mg Oral TID   allopurinol  200 mg Oral Daily   amitriptyline  25 mg Oral QHS   vitamin C  1,000 mg Oral Daily   bisacodyl  10 mg Rectal Once   calcitRIOL  0.5 mcg Oral Daily   carvedilol  25 mg Oral BID WC   darbepoetin (ARANESP) injection - DIALYSIS  100 mcg Intravenous Q  Thu-HD   docusate sodium  100 mg Oral TID   feeding supplement (NEPRO CARB STEADY)  237 mL Oral TID BM   insulin aspart  0-6 Units Subcutaneous TID WC   insulin aspart  2 Units Subcutaneous TID WC   levothyroxine  112 mcg Oral QAC breakfast   losartan  100 mg Oral Daily   nutrition supplement (JUVEN)  1 packet Oral BID BM   pantoprazole  40 mg Oral Daily   polyvinyl alcohol  1-2 drop Both Eyes Daily   pregabalin  150 mg Oral Daily   pregabalin  300 mg Oral QHS   rosuvastatin  10 mg Oral Daily   Semaglutide(0.25 or 0.5MG /DOS)  0.5 mg Subcutaneous Q7 days   senna  2 tablet Oral QHS   sevelamer carbonate  800 mg Oral TID WC   Vitamin D (Ergocalciferol)  50,000 Units Oral Q7 days    Dialysis Orders:  NxStage HHD 4x/ week   2K lactate 45 AVF w/ buttonholes  15g    89kg  Hep none  - mircera 100 mcg SQ last given 1/26 > due 2/09  Assessment/ Plan: SP L BKA - for nonhealing diabetic foot ulcer/osteomyelitis, done by Dr. [name] on 05/09/21.  in CIR  ESRD: Usually 4d/week at home, plan MWF schedule here. Next HD today, can resume HHD upon d/c Hypertension/volume: BP good on Losartan and Coreg. Weights variable.  Does not appear volume overloaded.   Anemia: Hgb 8.5- 10 here. Esa due on 2/09 > ordered darbe 100 mcg weekly on 2/9  Metabolic bone disease: Calcium and phosphorus levels currently at goal. Continue calcitriol and renvela.   Nutrition:  on protein supplement.  Diabetes mellitus: on insulin, management per primary team  Jen Mow, PA-C [facility] 05/23/2021,1:50 PM  LOS: 12 days

## 2021-05-23 NOTE — Progress Notes (Signed)
Patient ID: [name], male   DOB: 1950/07/28, 71 y.o.   MRN: [id number]  SW received follow up from [facility] agency. Contact information provided to patient spouse.  [name] [phone]

## 2021-05-23 NOTE — Progress Notes (Signed)
PROGRESS NOTE   Subjective/Complaints: No new complaints this morning Pain is well controlled Sleeping well at night Moving bowels regularly.   ROS:   Pt denies SOB, abd pain, CP, N/V/C/D, and vision changes, +residual limb pain, +phantom limb pain, +peripheral neuropathy in right foot  Objective:   No results found. Recent Labs    05/21/21 2049  WBC 7.0  HGB 10.2*  HCT 30.1*  PLT 184    Recent Labs    05/21/21 2049  NA 135  K 3.7  CL 95*  CO2 25  GLUCOSE 148*  BUN 64*  CREATININE 7.03*  CALCIUM 9.2     Intake/Output Summary (Last 24 hours) at 05/23/2021 0913 Last data filed at 05/23/2021 0840 Gross per 24 hour  Intake 880 ml  Output --  Net 880 ml     Pressure Injury 05/22/21 Buttocks Left Stage 2 -  Partial thickness loss of dermis presenting as a shallow open injury with a red, pink wound bed without slough. Blanchable open tear on left butt cheek with some scabbing (Active)  05/22/21 2030  Location: Buttocks  Location Orientation: Left  Staging: Stage 2 -  Partial thickness loss of dermis presenting as a shallow open injury with a red, pink wound bed without slough.  Wound Description (Comments): Blanchable open tear on left butt cheek with some scabbing  Present on Admission: No    Physical Exam: Vital Signs Blood pressure (!) 135/50, pulse 60, temperature 98 F (36.7 C), resp. rate 18, height 5\' 11"  (1.803 m), weight 90.8 kg, SpO2 98 %. Gen: no distress, normal appearing HEENT: oral mucosa pink and moist, NCAT Cardio: Reg rate Chest: normal effort, normal rate of breathing Abd: soft, non-distended Ext: no edema Psych: pleasant, normal affect Skin: intact MS: L BKA floating on pillow- tip not touching anything Skin:    Comments: Left BKA dressed appropriately tender. Has shrinker in place- some dried blood- and well as popped blister at tip- some bleeding seen- trace Neurological:      Comments: Patient is alert.  Makes eye contact with examiner.  Oriented x3    Assessment/Plan: 1. Functional deficits which require 3+ hours per day of interdisciplinary therapy in a comprehensive inpatient rehab setting. Physiatrist is providing close team supervision and 24 hour management of active medical problems listed below. Physiatrist and rehab team continue to assess barriers to discharge/monitor patient progress toward functional and medical goals  Care Tool:  Bathing    Body parts bathed by patient: Left arm, Right arm, Chest, Face, Right lower leg, Right upper leg, Left upper leg, Abdomen, Front perineal area, Buttocks   Body parts bathed by helper: Buttocks Body parts n/a: Left lower leg (amputation)   Bathing assist Assist Level: Supervision/Verbal cueing     Upper Body Dressing/Undressing Upper body dressing   What is the patient wearing?: Pull over shirt    Upper body assist Assist Level: Set up assist    Lower Body Dressing/Undressing Lower body dressing      What is the patient wearing?: Underwear/pull up, Pants     Lower body assist Assist for lower body dressing: Supervision/Verbal cueing     Toileting Toileting Toileting  Activity did not occur (Clothing management and hygiene only): N/A (no void or bm)  Toileting assist Assist for toileting: Maximal Assistance - Patient 25 - 49% (sit to stand in the [place])     Transfers Chair/bed transfer  Transfers assist     Chair/bed transfer assist level: Contact Guard/Touching assist     Locomotion Ambulation   Ambulation assist   Ambulation activity did not occur: Safety/medical concerns (L BKA, unsafe to hop due to condition on RLE, weakness, fatigue, pain)          Walk 10 feet activity   Assist  Walk 10 feet activity did not occur: Safety/medical concerns (L BKA, unsafe to hop due to condition on RLE, weakness, fatigue, pain)        Walk 50 feet activity   Assist Walk 50 feet  with 2 turns activity did not occur: Safety/medical concerns (L BKA, unsafe to hop due to condition on RLE, weakness, fatigue, pain)         Walk 150 feet activity   Assist Walk 150 feet activity did not occur: Safety/medical concerns (L BKA, unsafe to hop due to condition on RLE, weakness, fatigue, pain)         Walk 10 feet on uneven surface  activity   Assist Walk 10 feet on uneven surfaces activity did not occur: Safety/medical concerns (L BKA, unsafe to hop due to condition on RLE, weakness, fatigue, pain)         Wheelchair     Assist Is the patient using a wheelchair?: Yes Type of Wheelchair: Manual    Wheelchair assist level: Independent Max wheelchair distance: 159ft    Wheelchair 50 feet with 2 turns activity    Assist        Assist Level: Independent   Wheelchair 150 feet activity     Assist      Assist Level: Independent   Blood pressure (!) 135/50, pulse 60, temperature 98 F (36.7 C), resp. rate 18, height 5\' 11"  (1.803 m), weight 90.8 kg, SpO2 98 %. Medical Problem List and Plan: 1. Functional deficits secondary to left BKA 05/09/2021 secondary to peripheral vascular disease poor healing of recent left fourth and fifth ray amputation             -patient may shower but incision must be covered             -ELOS/Goals: 8-12 days S       Continue CIR- PT, OT -   Messaged April to schedule hospital follow-up  No limb guard- use shrinker and dressings OVER shrinker, per Dr [name].   -Interdisciplinary Team Conference today   2.  Antithrombotics: -DVT/anticoagulation:  Mechanical: Antiembolism stockings, thigh (TED hose) Right lower extremity             -antiplatelet therapy: N/A 3. Postoperative pain: d/c opioid medications due to sedation. Lyrica 150 mg daily and 300 mg nightly, oxycodone as needed. Schedule Tylenol 1000mg  TID, start 1,000mg  vitamin C daily. LFTs reviewed and stable. Increase amitriptyline to 25mg  HS. D/c tramadol as  per wife's request since making him drowsy 4. Anxiety: Provide emotional support. Increase amitriptyline to 25mg  5. Neuropsych: This patient is capable of making decisions on his own behalf. 6. Skin/Wound Care: Routine skin checks 7. Hyponatremia: resolved, continue to monitor Na with HD labs.  8.  End-stage renal disease.  Continue hemodialysis as per renal services. Diet changed to regular as per patient request.  9.  Acute on chronic anemia.  Hgb improving, monitor w/ HD labs.  10.  Hypertension.  Continue Cozaar 100 mg daily, Increase Coreg to 25 mg twice daily.  Monitor with increased mobility 11.  Diabetes mellitus with peripheral neuropathy.  Hemoglobin A1c 5.5.  NovoLog 2 units 3 times daily. Discussed outpatient Qutenza and he is very interested, messaged April to schedule this.   2/11- CBGs 95-164- 1 value above 150- con't regimen  2/12- CBGs 97-160- con't regimen 12.  Hypothyroidism.  Continue Synthroid 13.  Hyperlipidemia.  Crestor 14.  History of gout.  Continue allopurinol.  Monitor for any gout flareups 15.  History of atrial flutter with ablation 04/06/2020.  Follow-up cardiology services as needed.  Cardiac rate controlled 16.  History of prostate cancer status post TURP 06/28/2020.  Follow-up outpatient 17.  Obesity.  BMI 29.99.  Dietary follow-up 18.  GERD.  Protonix 19. Sedation: hold opioid medications 20. Vitamin D deficiency: continue ergocalciferol 50,000U once per week for 7 weeks. 21. Constipation: continue colace to 100mg  TID and add Senna 2 tabs HS. Discussed increasing bowel regimen but he defers at this time 22. Liver protection while on Tylenol: discussed benefits of NAC-will have to give outpatient as hospital does not allow supplements not approved by FDA, discussed with pharmacy 23. L BKA  2/11- will stop limb guard- also start Nepro per Dr [name] BID- since ESRD and DM.  24. Muscle spasms: add prn robaxin.    LOS: 12 days A FACE TO FACE EVALUATION WAS  PERFORMED  Clide Deutscher Bryen Hinderman 05/23/2021, 9:13 AM

## 2021-05-23 NOTE — Progress Notes (Signed)
°  Ready ready for d/c on 2/16. HH established with [facility].

## 2021-05-24 ENCOUNTER — Other Ambulatory Visit (HOSPITAL_COMMUNITY): Payer: Self-pay

## 2021-05-24 LAB — GLUCOSE, CAPILLARY: Glucose-Capillary: 157 mg/dL — ABNORMAL HIGH (ref 70–99)

## 2021-05-24 MED ORDER — TRAMADOL HCL 50 MG PO TABS
50.0000 mg | ORAL_TABLET | Freq: Four times a day (QID) | ORAL | 0 refills | Status: DC | PRN
Start: 2021-05-24 — End: 2021-08-18
  Filled 2021-05-24: qty 20, 5d supply, fill #0

## 2021-05-24 NOTE — Progress Notes (Signed)
Inpatient Rehabilitation Care Coordinator Discharge Note   Patient Details  Name: [name] MRN: [id number] Date of Birth: 1950-08-23   Discharge location: Home  Length of Stay: 13 Days  Discharge activity level: sup/cga  Home/community participation: spouse and hired assistance  Patient response GE:XBMWUX Literacy - How often do you need to have someone help you when you read instructions, pamphlets, or other written material from your doctor or pharmacy?: Rarely  Patient response LK:GMWNUU Isolation - How often do you feel lonely or isolated from those around you?: Never  Services provided included: SW, Pharmacy, TR, CM, RN, SLP, OT, PT, RD, MD  Financial Services:  Financial Services Utilized: Medicare    Choices offered to/list presented to: patient and spouse  Follow-up services arranged:  [place]: [facility]         Patient response to transportation need: Is the patient able to respond to transportation needs?: Yes In the past 12 months, has lack of transportation kept you from medical appointments or from getting medications?: No In the past 12 months, has lack of transportation kept you from meetings, work, or from getting things needed for daily living?: No    Comments (or additional information):  Patient/Family verbalized understanding of follow-up arrangements:  Yes  Individual responsible for coordination of the follow-up plan: Remo Lipps [phone]  Confirmed correct DME delivered: Dyanne Iha 05/24/2021    Dyanne Iha

## 2021-05-24 NOTE — Progress Notes (Signed)
[facility] Progress Note   Subjective:   Patient seen and examined at bedside . Tolerated hd yesterday, net uf 2L. Being discharged later this AM  Objective Vitals:   05/23/21 1638 05/23/21 1703 05/23/21 2004 05/24/21 0441  BP: 132/62 (!) 147/53 138/61 134/68  Pulse: (!) 58 60 66 60  Resp: 16 17 19 20   Temp: 97.7 F (36.5 C) 97.7 F (36.5 C) 97.6 F (36.4 C) 98.4 F (36.9 C)  TempSrc:  Oral Oral Oral  SpO2:  100% 100% 100%  Weight: 93.5 kg   90.6 kg  Height:       Physical Exam General:NAD Heart:RRR, no mrg Lungs:CTAB, nml WOB Abdomen:soft, NTND Extremities:L BKA, no LE edema Dialysis Access: :LU AVF in use   Filed Weights   05/23/21 1310 05/23/21 1638 05/24/21 0441  Weight: 96.9 kg 93.5 kg 90.6 kg    Intake/Output Summary (Last 24 hours) at 05/24/2021 0854 Last data filed at 05/24/2021 0801 Gross per 24 hour  Intake 600 ml  Output 2150 ml  Net -1550 ml    Additional Objective Labs: Basic Metabolic Panel: Recent Labs  Lab 05/18/21 1420 05/21/21 2049 05/23/21 1252  NA 138 135 135  K 4.5 3.7 5.0  CL 100 95* 94*  CO2 22 25 25   GLUCOSE 158* 148* 122*  BUN 76* 64* 105*  CREATININE 8.56* 7.03* 10.68*  CALCIUM 9.0 9.2 9.9  PHOS 5.3* 4.2 6.6*   Liver Function Tests: Recent Labs  Lab 05/18/21 1420 05/21/21 2049 05/23/21 1252  ALBUMIN 3.4* 3.7 3.7    CBC: Recent Labs  Lab 05/18/21 1420 05/21/21 2049 05/23/21 1252  WBC 5.3 7.0 7.7  HGB 8.3* 10.2* 9.6*  HCT 25.1* 30.1* 29.8*  MCV 90.9 90.1 91.1  PLT 144* 184 168   CBG: Recent Labs  Lab 05/23/21 0632 05/23/21 1203 05/23/21 1659 05/23/21 2105 05/24/21 0609  GLUCAP 107* 105* 107* 161* 157*   Medications:   acetaminophen  1,000 mg Oral TID   allopurinol  200 mg Oral Daily   amitriptyline  25 mg Oral QHS   vitamin C  1,000 mg Oral Daily   bisacodyl  10 mg Rectal Once   calcitRIOL  0.5 mcg Oral Daily   carvedilol  25 mg Oral BID WC   darbepoetin (ARANESP) injection -  DIALYSIS  100 mcg Intravenous Q Thu-HD   docusate sodium  100 mg Oral TID   feeding supplement (NEPRO CARB STEADY)  237 mL Oral TID BM   insulin aspart  0-6 Units Subcutaneous TID WC   insulin aspart  2 Units Subcutaneous TID WC   levothyroxine  112 mcg Oral QAC breakfast   losartan  100 mg Oral Daily   nutrition supplement (JUVEN)  1 packet Oral BID BM   pantoprazole  40 mg Oral Daily   polyvinyl alcohol  1-2 drop Both Eyes Daily   pregabalin  150 mg Oral Daily   pregabalin  300 mg Oral QHS   rosuvastatin  10 mg Oral Daily   Semaglutide(0.25 or 0.5MG /DOS)  0.5 mg Subcutaneous Q7 days   senna  2 tablet Oral QHS   sevelamer carbonate  800 mg Oral TID WC   Vitamin D (Ergocalciferol)  50,000 Units Oral Q7 days    Dialysis Orders:  NxStage HHD 4x/ week   2K lactate 45 AVF w/ buttonholes  15g    89kg  Hep none  - mircera 100 mcg SQ last given 1/26 > due 2/09   Assessment/ Plan: SP  L BKA - for nonhealing diabetic foot ulcer/osteomyelitis, done by Dr. [name] on 05/09/21.  in CIR, d/c'ing to home today  ESRD: Usually 4d/week at home, plan MWF schedule here.  can resume HHD upon d/c Hypertension/volume: BP good on Losartan and Coreg. Weights variable.  Does not appear volume overloaded.   Anemia: Hgb 8.5- 10 here. Esa due on 2/09 > ordered darbe 100 mcg weekly on 2/9  Metabolic bone disease: Calcium and phosphorus levels currently at goal. Continue calcitriol and renvela.   Nutrition:  on protein supplement.  Diabetes mellitus: on insulin, management per primary team  Gean Quint, MD [place] 05/24/2021,8:54 AM  LOS: 13 days

## 2021-05-24 NOTE — Progress Notes (Signed)
Patient discharged off of unit with all belongings. Discharge papers/instructions explained by physician assistant to family. Patient and family have no further questions at time of discharge. Home medication delivered and handed to wife. Transition of care medications dropped off as well. No complications noted at this time.  [employer]

## 2021-05-24 NOTE — Progress Notes (Signed)
PROGRESS NOTE   Subjective/Complaints: No new complaints this morning Still not sleeping well. Feels he was not given amitriptyline last night. Checked and it was administered. Discussed that hopefully he will sleep better once home  ROS:   Pt denies SOB, abd pain, CP, N/V/C/D, and vision changes, +residual limb pain, +phantom limb pain, +peripheral neuropathy in right foot, +insomnia  Objective:   No results found. Recent Labs    05/21/21 2049 05/23/21 1252  WBC 7.0 7.7  HGB 10.2* 9.6*  HCT 30.1* 29.8*  PLT 184 168    Recent Labs    05/21/21 2049 05/23/21 1252  NA 135 135  K 3.7 5.0  CL 95* 94*  CO2 25 25  GLUCOSE 148* 122*  BUN 64* 105*  CREATININE 7.03* 10.68*  CALCIUM 9.2 9.9     Intake/Output Summary (Last 24 hours) at 05/24/2021 0959 Last data filed at 05/24/2021 0801 Gross per 24 hour  Intake 600 ml  Output 2150 ml  Net -1550 ml     Pressure Injury 05/22/21 Buttocks Left Stage 2 -  Partial thickness loss of dermis presenting as a shallow open injury with a red, pink wound bed without slough. Blanchable open tear on left butt cheek with some scabbing (Active)  05/22/21 2030  Location: Buttocks  Location Orientation: Left  Staging: Stage 2 -  Partial thickness loss of dermis presenting as a shallow open injury with a red, pink wound bed without slough.  Wound Description (Comments): Blanchable open tear on left butt cheek with some scabbing  Present on Admission: No    Physical Exam: Vital Signs Blood pressure 134/68, pulse 60, temperature 98.4 F (36.9 C), temperature source Oral, resp. rate 20, height 5\' 11"  (1.803 m), weight 90.6 kg, SpO2 100 %. Gen: no distress, normal appearing HEENT: oral mucosa pink and moist, NCAT Cardio: Reg rate Chest: normal effort, normal rate of breathing Abd: soft, non-distended Ext: no edema Psych: pleasant, normal affect MS: L BKA floating on pillow- tip not  touching anything Skin:    Comments: Left BKA dressed appropriately tender. Has shrinker in place- some dried blood- and well as popped blister at tip- some bleeding seen- trace Neurological:     Comments: Patient is alert.  Makes eye contact with examiner.  Oriented x3    Assessment/Plan: 1. Functional deficits which require 3+ hours per day of interdisciplinary therapy in a comprehensive inpatient rehab setting. Physiatrist is providing close team supervision and 24 hour management of active medical problems listed below. Physiatrist and rehab team continue to assess barriers to discharge/monitor patient progress toward functional and medical goals  Care Tool:  Bathing    Body parts bathed by patient: Left arm, Right arm, Chest, Face, Right lower leg, Right upper leg, Left upper leg, Abdomen, Front perineal area, Buttocks   Body parts bathed by helper: Buttocks Body parts n/a: Left lower leg (amputation)   Bathing assist Assist Level: Supervision/Verbal cueing     Upper Body Dressing/Undressing Upper body dressing   What is the patient wearing?: Pull over shirt    Upper body assist Assist Level: Independent with assistive device    Lower Body Dressing/Undressing Lower body dressing  What is the patient wearing?: Underwear/pull up, Pants     Lower body assist Assist for lower body dressing: Independent with assitive device (bed level)     Toileting Toileting Toileting Activity did not occur (Clothing management and hygiene only): N/A (no void or bm)  Toileting assist Assist for toileting: Supervision/Verbal cueing     Transfers Chair/bed transfer  Transfers assist     Chair/bed transfer assist level: Contact Guard/Touching assist     Locomotion Ambulation   Ambulation assist   Ambulation activity did not occur: Safety/medical concerns (L BKA, unsafe to hop due to condition on RLE, weakness, fatigue, pain)          Walk 10 feet  activity   Assist  Walk 10 feet activity did not occur: Safety/medical concerns (L BKA, unsafe to hop due to condition on RLE, weakness, fatigue, pain)        Walk 50 feet activity   Assist Walk 50 feet with 2 turns activity did not occur: Safety/medical concerns (L BKA, unsafe to hop due to condition on RLE, weakness, fatigue, pain)         Walk 150 feet activity   Assist Walk 150 feet activity did not occur: Safety/medical concerns (L BKA, unsafe to hop due to condition on RLE, weakness, fatigue, pain)         Walk 10 feet on uneven surface  activity   Assist Walk 10 feet on uneven surfaces activity did not occur: Safety/medical concerns (L BKA, unsafe to hop due to condition on RLE, weakness, fatigue, pain)         Wheelchair     Assist Is the patient using a wheelchair?: Yes Type of Wheelchair: Manual    Wheelchair assist level: Independent Max wheelchair distance: 154ft    Wheelchair 50 feet with 2 turns activity    Assist        Assist Level: Independent   Wheelchair 150 feet activity     Assist      Assist Level: Independent   Blood pressure 134/68, pulse 60, temperature 98.4 F (36.9 C), temperature source Oral, resp. rate 20, height 5\' 11"  (1.803 m), weight 90.6 kg, SpO2 100 %. Medical Problem List and Plan: 1. Functional deficits secondary to left BKA 05/09/2021 secondary to peripheral vascular disease poor healing of recent left fourth and fifth ray amputation             -patient may shower but incision must be covered             -ELOS/Goals: 8-12 days S       D/c home today  Messaged April to schedule hospital follow-up  No limb guard- use shrinker and dressings OVER shrinker, per Dr [name].  2.  Antithrombotics: -DVT/anticoagulation:  Mechanical: Antiembolism stockings, thigh (TED hose) Right lower extremity             -antiplatelet therapy: N/A 3. Postoperative pain: d/c opioid medications due to sedation. Lyrica 150 mg  daily and 300 mg nightly, oxycodone as needed. Schedule Tylenol 1000mg  TID, start 1,000mg  vitamin C daily. LFTs reviewed and stable. Continue amitriptyline to 25mg  HS. D/c tramadol as per wife's request since making him drowsy 4. Anxiety: Provide emotional support. Increase amitriptyline to 25mg  5. Neuropsych: This patient is capable of making decisions on his own behalf. 6. Skin/Wound Care: Routine skin checks 7. Hyponatremia: resolved, continue to monitor Na with HD labs.  8.  End-stage renal disease.  Continue hemodialysis as per renal services. Diet  changed to regular as per patient request.  9.  Acute on chronic anemia.  Hgb improving, monitor w/ HD labs.  10.  Hypertension.  Continue Cozaar 100 mg daily, Increase Coreg to 25 mg twice daily.  Monitor with increased mobility 11.  Diabetes mellitus with peripheral neuropathy.  Hemoglobin A1c 5.5.  NovoLog 2 units 3 times daily. Discussed outpatient Qutenza and he is very interested, messaged April to schedule this. Discussed with patient we would give him these appointment times today 12.  Hypothyroidism.  Continue Synthroid 13.  Hyperlipidemia.  Crestor 14.  History of gout.  Continue allopurinol.  Monitor for any gout flareups 15.  History of atrial flutter with ablation 04/06/2020.  Follow-up cardiology services as needed.  Cardiac rate controlled 16.  History of prostate cancer status post TURP 06/28/2020.  Follow-up outpatient 17.  Obesity.  BMI 29.99.  Dietary follow-up 18.  GERD.  Protonix 19. Sedation: hold opioid medications 20. Vitamin D deficiency: continue ergocalciferol 50,000U once per week for 7 weeks. 21. Constipation: continue colace to 100mg  TID and add Senna 2 tabs HS. Discussed increasing bowel regimen but he defers at this time 22. Liver protection while on Tylenol: discussed benefits of NAC-will have to give outpatient as hospital does not allow supplements not approved by FDA, discussed with pharmacy 23. L BKA  2/11-  will stop limb guard- also start Nepro per Dr [name] BID- since ESRD and DM.  24. Muscle spasms: add prn robaxin.   >30 minutes spent in discharge of patient including review of medications and follow-up appointments, physical examination, and in answering all patient's questions     LOS: 13 days A FACE TO FACE EVALUATION WAS [place] 05/24/2021, 9:59 AM

## 2021-05-24 NOTE — Progress Notes (Signed)
Contacted [facility] therapy unit and spoke to [place], Therapist, sports. Clinic advised that pt d/c from rehab today to home. Clinic advised pt's last HD treatment was yesterday as inpt.  Melven Sartorius Renal Navigator [phone]

## 2021-05-29 ENCOUNTER — Ambulatory Visit (INDEPENDENT_AMBULATORY_CARE_PROVIDER_SITE_OTHER): Payer: Medicare Other | Admitting: Orthopedic Surgery

## 2021-05-29 ENCOUNTER — Other Ambulatory Visit: Payer: Self-pay

## 2021-05-29 DIAGNOSIS — Z89512 Acquired absence of left leg below knee: Secondary | ICD-10-CM

## 2021-05-29 DIAGNOSIS — S88112A Complete traumatic amputation at level between knee and ankle, left lower leg, initial encounter: Secondary | ICD-10-CM

## 2021-05-29 MED ORDER — OXYCODONE-ACETAMINOPHEN 5-325 MG PO TABS: 1.0000 | ORAL_TABLET | ORAL | 0 refills | Status: DC | PRN

## 2021-06-08 ENCOUNTER — Telehealth: Payer: Self-pay | Admitting: Orthopedic Surgery

## 2021-06-08 NOTE — Telephone Encounter (Signed)
Noted  

## 2021-06-08 NOTE — Telephone Encounter (Signed)
[name] (RN) from [facility] called wound on stump little drainage. Pt told nurse Dr. Darlina Sicilian want dressing on wound. She called as [name]. Scabs lifted before she put shrinker back on. Please call pt if questions at [phone] ?

## 2021-06-11 ENCOUNTER — Encounter: Payer: Self-pay | Admitting: Orthopedic Surgery

## 2021-06-11 NOTE — Progress Notes (Signed)
Office Visit Note   Patient: [name]           Date of Birth: [birth year]           MRN: [id number] Visit Date: 05/29/2021              Requested by: Lorenda Hatchet, FNP [id number] [place],  [place] [address] PCP: Lorenda Hatchet, FNP  Chief Complaint  Patient presents with   Left Leg - Routine Post Op    05/09/21 left BKA      HPI: Patient is a 71 year old gentleman who is 3 weeks status post left transtibial amputation.  Patient states he still has pain.  Assessment & Plan: Visit Diagnoses:  1. Below-knee amputation of left lower extremity ([place])     Plan: Patient is given a prescription for Percocet Staples are removed he is given a 2 XL compression sock  Follow-Up Instructions: Return in about 2 weeks (around 06/12/2021).   Ortho Exam  Patient is alert, oriented, no adenopathy, well-dressed, normal affect, normal respiratory effort. Examination patient has no drainage there are some ischemic changes to the residual limb no signs of infection.  He is developing a decubitus ulcer on the posterior aspect of the residual limb.  Discussed the importance of floating the leg so its not resting on the back of the leg he is given a compression shrinker to help decrease the edema the calf is 37 cm in circumference staples are removed.  Imaging: No results found.     Labs: Lab Results  Component Value Date   HGBA1C 5.5 05/04/2021   HGBA1C 6.3 (H) 02/02/2021   HGBA1C 5.4 09/13/2020   ESRSEDRATE 34 (H) 01/24/2020   ESRSEDRATE 41 (H) 01/10/2020   ESRSEDRATE 55 (H) 12/27/2019   CRP 8.8 (H) 01/24/2020   CRP 35.9 (H) 01/10/2020   CRP 77.9 (H) 12/27/2019   REPTSTATUS 02/05/2021 FINAL 02/02/2021   GRAMSTAIN  02/02/2021    NO WBC SEEN RARE GRAM VARIABLE COCCI RARE GRAM NEGATIVE RODS    CULT  02/02/2021    RARE ENTEROCOCCUS FAECALIS RARE DIPHTHEROIDS(CORYNEBACTERIUM SPECIES) Standardized susceptibility testing for this organism is not  available. FEW BACTEROIDES FRAGILIS BETA LACTAMASE POSITIVE Performed at [facility], [place] [address] [place], [place] [address]    LABORGA ENTEROCOCCUS FAECALIS 02/02/2021     Lab Results  Component Value Date   ALBUMIN 3.7 05/23/2021   ALBUMIN 3.7 05/21/2021   ALBUMIN 3.4 (L) 05/18/2021   PREALBUMIN 32.7 05/09/2021   PREALBUMIN 14.8 (L) 12/14/2018    Lab Results  Component Value Date   MG 2.4 05/14/2021   MG 1.9 05/09/2021   MG 2.0 06/28/2020   Lab Results  Component Value Date   VD25OH 16.13 (L) 05/09/2021    Lab Results  Component Value Date   PREALBUMIN 32.7 05/09/2021   PREALBUMIN 14.8 (L) 12/14/2018   CBC EXTENDED Latest Ref Rng & Units 05/23/2021 05/21/2021 05/18/2021  WBC 4.0 - 10.5 K/uL 7.7 7.0 5.3  RBC 4.22 - 5.81 MIL/uL 3.27(L) 3.34(L) 2.76(L)  HGB 13.0 - 17.0 g/dL 9.6(L) 10.2(L) 8.3(L)  HCT 39.0 - 52.0 % 29.8(L) 30.1(L) 25.1(L)  PLT 150 - 400 K/uL 168 184 144(L)  NEUTROABS 1.7 - 7.7 K/uL - - -  LYMPHSABS 0.7 - 4.0 K/uL - - -     There is no height or weight on file to calculate BMI.  Orders:  No orders of the defined types were placed in this  encounter.  Meds ordered this encounter  Medications   DISCONTD: oxyCODONE-acetaminophen (PERCOCET/ROXICET) 5-325 MG tablet    Sig: Take 1 tablet by mouth every 4 (four) hours as needed for severe pain.    Dispense:  60 tablet    Refill:  0   oxyCODONE-acetaminophen (PERCOCET/ROXICET) 5-325 MG tablet    Sig: Take 1 tablet by mouth every 4 (four) hours as needed for severe pain.    Dispense:  30 tablet    Refill:  0     Procedures: No procedures performed  Clinical Data: No additional findings.  ROS:  All other systems negative, except as noted in the HPI. Review of Systems  Objective: Vital Signs: There were no vitals taken for this visit.  Specialty Comments:  No specialty comments available.  PMFS History: Patient Active Problem List   Diagnosis Date Noted   Left below-knee  amputee ([place]) 05/11/2021   S/P BKA (below knee amputation) unilateral, left ([place]) 05/09/2021   Partial nontraumatic amputation of foot, left ([place]) 02/20/2021   Abscess of bursa of left foot 02/03/2021   Cutaneous abscess of left foot    Acute osteomyelitis of metatarsal bone of left foot (HCC)    Symptomatic anemia 09/13/2020   COVID-19 virus infection 09/13/2020   Pressure injury of skin 06/29/2020   Gross hematuria 06/16/2020   Typical atrial flutter ([place]) 03/24/2020   Bilateral pleural effusion 02/24/2020   Healthcare maintenance 01/28/2020   Shortness of breath 01/28/2020   History of partial ray amputation of fourth toe of right foot ([place]) 09/08/2019   Ulcerated, foot, right, with necrosis of bone ([place])    Chronic cough 06/25/2019   Cutaneous abscess of right foot    Subacute osteomyelitis of right foot ([place]) 12/14/2018   AKI (acute kidney injury) ([place]) 12/14/2018   ESRD (end stage renal disease) ([place]) 12/14/2018   Gait abnormality 08/24/2018   Diabetic neuropathy ([place]) 02/06/2018   Cervical myelopathy ([place]) 02/06/2018   Onychomycosis 10/30/2017   Other spondylosis with radiculopathy, cervical region 01/27/2017   Midfoot ulcer, right, limited to breakdown of skin ([place]) 11/15/2016   Lateral epicondylitis, left elbow 08/12/2016   Cellulitis of fifth toe of right foot 07/29/2016   Prostate cancer ([place]) 06/19/2016   Non-pressure chronic ulcer of other part of right foot limited to breakdown of skin ([place]) 04/04/2016   Cellulitis of leg, right 12/24/2015   Cellulitis of right lower extremity 12/24/2015   Gout 09/14/2012   HTN (hypertension) 09/14/2012   Hypothyroidism 09/14/2012   CAD (coronary artery disease) 09/14/2012   Diabetes mellitus ([place]) 09/14/2012   Hypercholesteremia    Neuropathy ([place])    Past Medical History:  Diagnosis Date   Ambulates with cane    straight cane   Cervical myelopathy ([place]) 02/06/2018   Chronic kidney disease    dailysis M W F- home    Complication of anesthesia    Coronary artery disease    Diabetes mellitus without complication ([place])    type2   Diabetic foot ulcer ([place])    Diabetic neuropathy ([place]) 02/06/2018   Gait abnormality 08/24/2018   GERD (gastroesophageal reflux disease)     01/06/20- not current   Gout    History of blood transfusion    History of kidney stones    passed stones   Hypercholesteremia    Hypertension    Hypothyroidism    Neuromuscular disorder ([place])    neuropathy left leg and bilateral feet   Neuropathy    PONV (postoperative nausea and  vomiting)    Prostate cancer (HCC)    PVD (peripheral vascular disease) ([place])    with amputations    Family History  Problem Relation Age of Onset   Diabetes Mellitus II Mother    Kidney disease Mother    Diabetes Mellitus II Father    CAD Father    Cancer Father        prostate   Kidney disease Father    Diabetes Mellitus II Brother    Kidney disease Brother    Diabetes Mellitus II Brother    Stomach cancer Brother 53   Kidney disease Brother    Colon cancer Neg Hx    Colon polyps Neg Hx    Esophageal cancer Neg Hx    Rectal cancer Neg Hx    Pancreatic cancer Neg Hx     Past Surgical History:  Procedure Laterality Date   A-FLUTTER ABLATION N/A 04/06/2020   Procedure: A-FLUTTER ABLATION;  Surgeon: [name], MD;  Location: [facility];  Service: Cardiovascular;  Laterality: N/A;   AMPUTATION Left 12/25/2013   Procedure: AMPUTATION RAY LEFT 5TH RAY;  Surgeon: [name], MD;  [facility];  Service: Orthopedics;  Laterality: Left;   AMPUTATION Right 12/15/2018   Procedure: AMPUTATION OF 4TH AND 5TH TOES RIGHT FOOT;  Surgeon: [name], MD;  Location: [facility];  Service: Orthopedics;  Laterality: Right;   AMPUTATION Left 02/02/2021   Procedure: LEFT FOOT 4TH RAY AMPUTATION;  Surgeon: [name], MD;  Location: [facility];  Service: Orthopedics;  Laterality: Left;   AMPUTATION Left 05/09/2021   Procedure: LEFT BELOW KNEE  AMPUTATION;  Surgeon: [name], MD;  Location: [facility];  Service: Orthopedics;  Laterality: Left;   APPLICATION OF WOUND VAC Right 12/15/2018   Procedure: APPLICATION OF WOUND VAC;  Surgeon: [name], MD;  Location: [facility];  Service: Orthopedics;  Laterality: Right;   AV FISTULA PLACEMENT Left 03/25/2019   Procedure: LEFT ARM ARTERIOVENOUS (AV) FISTULA CREATION;  Surgeon: [name], MD;  Location: [facility];  Service: Vascular;  Laterality: Left;   [place] Left 05/20/2019   Procedure: SECOND STAGE LEFT BASCILIC VEIN TRANSPOSITION;  Surgeon: [name], MD;  Location: [facility];  Service: Vascular;  Laterality: Left;   CARDIAC CATHETERIZATION  02/17/2014   CHOLECYSTECTOMY     COLONOSCOPY  2011   in [place], [place]  2008   CYSTOSCOPY N/A 06/29/2020   Procedure: [place];  Surgeon: [name], MD;  Location: [facility];  Service: Urology;  Laterality: N/A;   CYSTOSCOPY WITH FULGERATION Bilateral 06/17/2020   Procedure: CYSTOSCOPY,BILATERAL RETROGRADE, CLOT EVACUATION WITH FULGERATION OF THE BLADDER;  Surgeon: Janith Lima, MD;  Location: [facility];  Service: Urology;  Laterality: Bilateral;   CYSTOSCOPY WITH FULGERATION N/A 06/28/2020   Procedure: CYSTOSCOPY WITH CLOT EVACUATION AND FULGERATION OF BLEEDERS;  Surgeon: [name], MD;  Location: [facility];  Service: Urology;  Laterality: N/A;  1 HR   I & D EXTREMITY Right 12/15/2018   Procedure: DEBRIDEMENT RIGHT FOOT;  Surgeon: [name], MD;  Location: [facility];  Service: Orthopedics;  Laterality: Right;   I & D EXTREMITY Right 08/27/2019   Procedure: PARTIAL CUBOID EXCISION RIGHT FOOT;  Surgeon: [name], MD;  Location: [facility];  Service: Orthopedics;  Laterality: Right;   I & D EXTREMITY Right  01/07/2020   Procedure: RIGHT FOOT EXCISION INFECTED BONE;  Surgeon: [name], MD;  Location: [facility];  Service:  Orthopedics;  Laterality: Right;   NECK SURGERY     novemver 2019   TRANSURETHRAL RESECTION OF PROSTATE N/A 06/28/2020   Procedure: TRANSURETHRAL RESECTION OF THE PROSTATE (TURP);  Surgeon: [name], MD;  Location: [facility];  Service: Urology;  Laterality: N/A;   WISDOM TOOTH EXTRACTION     Social History   Occupational History   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

## 2021-06-12 ENCOUNTER — Encounter: Payer: Self-pay | Admitting: Orthopedic Surgery

## 2021-06-12 ENCOUNTER — Ambulatory Visit (INDEPENDENT_AMBULATORY_CARE_PROVIDER_SITE_OTHER): Payer: Medicare Other | Admitting: Orthopedic Surgery

## 2021-06-12 DIAGNOSIS — Z89512 Acquired absence of left leg below knee: Secondary | ICD-10-CM

## 2021-06-12 DIAGNOSIS — S88112A Complete traumatic amputation at level between knee and ankle, left lower leg, initial encounter: Secondary | ICD-10-CM

## 2021-06-12 DIAGNOSIS — L97411 Non-pressure chronic ulcer of right heel and midfoot limited to breakdown of skin: Secondary | ICD-10-CM

## 2021-06-12 MED ORDER — AMOXICILLIN-POT CLAVULANATE 875-125 MG PO TABS: 1.0000 | ORAL_TABLET | Freq: Two times a day (BID) | ORAL | 0 refills | Status: DC

## 2021-06-12 NOTE — Progress Notes (Signed)
Office Visit Note   Patient: [name]           Date of Birth: 11-19-1950           MRN: [id number] Visit Date: 06/12/2021              Requested by: Lorenda Hatchet, FNP [address] [place],  [place] [address] PCP: Lorenda Hatchet, FNP  Chief Complaint  Patient presents with   Left Leg - Routine Post Op      HPI: Patient is a 71 year old gentleman who presents in follow-up for left transtibial amputation as well as lateral ray amputations of the right foot.  Assessment & Plan: Visit Diagnoses:  1. Below-knee amputation of left lower extremity ([name])   2. Midfoot ulcer, right, limited to breakdown of skin ([name])     Plan: The retained suture was removed we will have him resume Augmentin use Dial soap cleansing 4 x 4 gauze in the 2 extra-large stump shrinker.  Again discussed the importance of not resting his right foot on the lateral aspect of his foot.  Follow-Up Instructions: Return in about 1 week (around 06/19/2021).   Ortho Exam  Patient is alert, oriented, no adenopathy, well-dressed, normal affect, normal respiratory effort. Examination of right foot patient is developing a decubitus ulcer laterally.  Discussed the importance of offloading the lateral aspect of his foot off the ground there is no cellulitis there is no full-thickness skin defect.  Examination the left transtibial amputation the ulcerative area in the posterior aspect of the residual limb has completely resolved.  The redness over the anterior aspect has resolved as well there is some mild discoloration there is some superficial eschar.  Patient has a full-thickness defect 2 cm diameter 1 cm deep where there is a retained anteriorly in the midline.  This was removed Iodosorb dressing is applied.  Imaging: No results found. No images are attached to the encounter.  Labs: Lab Results  Component Value Date   HGBA1C 5.5 05/04/2021   HGBA1C 6.3 (H) 02/02/2021   HGBA1C 5.4 09/13/2020    ESRSEDRATE 34 (H) 01/24/2020   ESRSEDRATE 41 (H) 01/10/2020   ESRSEDRATE 55 (H) 12/27/2019   CRP 8.8 (H) 01/24/2020   CRP 35.9 (H) 01/10/2020   CRP 77.9 (H) 12/27/2019   REPTSTATUS 02/05/2021 FINAL 02/02/2021   GRAMSTAIN  02/02/2021    NO WBC SEEN RARE GRAM VARIABLE COCCI RARE GRAM NEGATIVE RODS    CULT  02/02/2021    RARE ENTEROCOCCUS FAECALIS RARE DIPHTHEROIDS(CORYNEBACTERIUM SPECIES) Standardized susceptibility testing for this organism is not available. FEW BACTEROIDES FRAGILIS BETA LACTAMASE POSITIVE Performed at [facility], [place] [address], [place], [place] [address]    LABORGA ENTEROCOCCUS FAECALIS 02/02/2021     Lab Results  Component Value Date   ALBUMIN 3.7 05/23/2021   ALBUMIN 3.7 05/21/2021   ALBUMIN 3.4 (L) 05/18/2021   PREALBUMIN 32.7 05/09/2021   PREALBUMIN 14.8 (L) 12/14/2018    Lab Results  Component Value Date   MG 2.4 05/14/2021   MG 1.9 05/09/2021   MG 2.0 06/28/2020   Lab Results  Component Value Date   VD25OH 16.13 (L) 05/09/2021    Lab Results  Component Value Date   PREALBUMIN 32.7 05/09/2021   PREALBUMIN 14.8 (L) 12/14/2018   CBC EXTENDED Latest Ref Rng & Units 05/23/2021 05/21/2021 05/18/2021  WBC 4.0 - 10.5 K/uL 7.7 7.0 5.3  RBC 4.22 - 5.81 MIL/uL 3.27(L) 3.34(L) 2.76(L)  HGB  13.0 - 17.0 g/dL 9.6(L) 10.2(L) 8.3(L)  HCT 39.0 - 52.0 % 29.8(L) 30.1(L) 25.1(L)  PLT 150 - 400 K/uL 168 184 144(L)  NEUTROABS 1.7 - 7.7 K/uL - - -  LYMPHSABS 0.7 - 4.0 K/uL - - -     There is no height or weight on file to calculate BMI.  Orders:  No orders of the defined types were placed in this encounter.  No orders of the defined types were placed in this encounter.    Procedures: No procedures performed  Clinical Data: No additional findings.  ROS:  All other systems negative, except as noted in the HPI. Review of Systems  Objective: Vital Signs: There were no vitals taken for this visit.  Specialty Comments:  No specialty  comments available.  PMFS History: Patient Active Problem List   Diagnosis Date Noted   Left below-knee amputee ([place]) 05/11/2021   S/P BKA (below knee amputation) unilateral, left ([name]) 05/09/2021   Partial nontraumatic amputation of foot, left ([name]) 02/20/2021   Abscess of bursa of left foot 02/03/2021   Cutaneous abscess of left foot    Acute osteomyelitis of metatarsal bone of left foot (HCC)    Symptomatic anemia 09/13/2020   COVID-19 virus infection 09/13/2020   Pressure injury of skin 06/29/2020   Gross hematuria 06/16/2020   Typical atrial flutter ([place]) 03/24/2020   Bilateral pleural effusion 02/24/2020   Healthcare maintenance 01/28/2020   Shortness of breath 01/28/2020   History of partial ray amputation of fourth toe of right foot ([name]) 09/08/2019   Ulcerated, foot, right, with necrosis of bone ([name])    Chronic cough 06/25/2019   Cutaneous abscess of right foot    Subacute osteomyelitis of right foot ([place]) 12/14/2018   AKI (acute kidney injury) ([place]) 12/14/2018   ESRD (end stage renal disease) ([place]) 12/14/2018   Gait abnormality 08/24/2018   Diabetic neuropathy ([facility]) 02/06/2018   Cervical myelopathy ([place]) 02/06/2018   Onychomycosis 10/30/2017   Other spondylosis with radiculopathy, cervical region 01/27/2017   Midfoot ulcer, right, limited to breakdown of skin ([name]) 11/15/2016   Lateral epicondylitis, left elbow 08/12/2016   Cellulitis of fifth toe of right foot 07/29/2016   Prostate cancer ([name]) 06/19/2016   Non-pressure chronic ulcer of other part of right foot limited to breakdown of skin ([name]) 04/04/2016   Cellulitis of leg, right 12/24/2015   Cellulitis of right lower extremity 12/24/2015   Gout 09/14/2012   HTN (hypertension) 09/14/2012   Hypothyroidism 09/14/2012   CAD (coronary artery disease) 09/14/2012   Diabetes mellitus ([name]) 09/14/2012   Hypercholesteremia    Neuropathy ([name])    Past Medical History:  Diagnosis Date   Ambulates with cane     straight cane   Cervical myelopathy ([name]) 02/06/2018   Chronic kidney disease    dailysis M W F- home   Complication of anesthesia    Coronary artery disease    Diabetes mellitus without complication ([place])    type2   Diabetic foot ulcer ([place])    Diabetic neuropathy ([place]) 02/06/2018   Gait abnormality 08/24/2018   GERD (gastroesophageal reflux disease)     01/06/20- not current   Gout    History of blood transfusion    History of kidney stones    passed stones   Hypercholesteremia    Hypertension    Hypothyroidism    Neuromuscular disorder ([place])    neuropathy left leg and bilateral feet   Neuropathy    PONV (postoperative nausea and vomiting)  Prostate cancer ([place])    PVD (peripheral vascular disease) ([place])    with amputations    Family History  Problem Relation Age of Onset   Diabetes Mellitus II Mother    Kidney disease Mother    Diabetes Mellitus II Father    CAD Father    Cancer Father        prostate   Kidney disease Father    Diabetes Mellitus II Brother    Kidney disease Brother    Diabetes Mellitus II Brother    Stomach cancer Brother 27   Kidney disease Brother    Colon cancer Neg Hx    Colon polyps Neg Hx    Esophageal cancer Neg Hx    Rectal cancer Neg Hx    Pancreatic cancer Neg Hx     Past Surgical History:  Procedure Laterality Date   A-FLUTTER ABLATION N/A 04/06/2020   Procedure: A-FLUTTER ABLATION;  Surgeon: [name], MD;  Location: [facility];  Service: Cardiovascular;  Laterality: N/A;   AMPUTATION Left 12/25/2013   Procedure: AMPUTATION RAY LEFT 5TH RAY;  Surgeon: [name], MD;  [facility];  Service: Orthopedics;  Laterality: Left;   AMPUTATION Right 12/15/2018   Procedure: AMPUTATION OF 4TH AND 5TH TOES RIGHT FOOT;  Surgeon: [name], MD;  Location: [facility];  Service: Orthopedics;  Laterality: Right;   AMPUTATION Left 02/02/2021   Procedure: LEFT FOOT 4TH RAY AMPUTATION;  Surgeon: [name], MD;  Location: [facility];  Service: Orthopedics;  Laterality: Left;   AMPUTATION Left 05/09/2021   Procedure: LEFT BELOW KNEE AMPUTATION;  Surgeon: [name], MD;  Location: [facility];  Service: Orthopedics;  Laterality: Left;   APPLICATION OF WOUND VAC Right 12/15/2018   Procedure: APPLICATION OF WOUND VAC;  Surgeon: [name], MD;  Location: [facility];  Service: Orthopedics;  Laterality: Right;   AV FISTULA PLACEMENT Left 03/25/2019   Procedure: LEFT ARM ARTERIOVENOUS (AV) FISTULA CREATION;  Surgeon: [name], MD;  Location: [facility];  Service: Vascular;  Laterality: Left;   [place] Left 05/20/2019   Procedure: SECOND STAGE LEFT BASCILIC VEIN TRANSPOSITION;  Surgeon: [name], MD;  Location: [facility];  Service: Vascular;  Laterality: Left;   CARDIAC CATHETERIZATION  02/17/2014   CHOLECYSTECTOMY     COLONOSCOPY  2011   in [place], [place]  2008   CYSTOSCOPY N/A 06/29/2020   Procedure: [place];  Surgeon: [name], MD;  Location: [facility];  Service: Urology;  Laterality: N/A;   CYSTOSCOPY WITH FULGERATION Bilateral 06/17/2020   Procedure: CYSTOSCOPY,BILATERAL RETROGRADE, CLOT EVACUATION WITH FULGERATION OF THE BLADDER;  Surgeon: Janith Lima, MD;  Location: [facility];  Service: Urology;  Laterality: Bilateral;   CYSTOSCOPY WITH FULGERATION N/A 06/28/2020   Procedure: CYSTOSCOPY WITH CLOT EVACUATION AND FULGERATION OF BLEEDERS;  Surgeon: [name], MD;  Location: [facility];  Service: Urology;  Laterality: N/A;  1 HR   I & D EXTREMITY Right 12/15/2018   Procedure: DEBRIDEMENT RIGHT FOOT;  Surgeon: [name], MD;  Location: [facility];  Service: Orthopedics;  Laterality: Right;   I & D EXTREMITY Right 08/27/2019   Procedure: PARTIAL CUBOID EXCISION RIGHT FOOT;  Surgeon: [name], MD;  Location: [facility];  Service: Orthopedics;  Laterality: Right;   I & D EXTREMITY Right 01/07/2020   Procedure:  RIGHT FOOT EXCISION INFECTED BONE;  Surgeon: [name], MD;  Location: [facility];  Service: Orthopedics;  Laterality: Right;   NECK SURGERY     novemver 2019   TRANSURETHRAL RESECTION OF PROSTATE N/A 06/28/2020   Procedure: TRANSURETHRAL RESECTION OF THE PROSTATE (TURP);  Surgeon: [name], MD;  Location: [facility];  Service: Urology;  Laterality: N/A;   WISDOM TOOTH EXTRACTION     Social History   Occupational History   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

## 2021-06-21 ENCOUNTER — Ambulatory Visit (INDEPENDENT_AMBULATORY_CARE_PROVIDER_SITE_OTHER): Payer: Medicare Other | Admitting: Orthopedic Surgery

## 2021-06-21 DIAGNOSIS — S88112A Complete traumatic amputation at level between knee and ankle, left lower leg, initial encounter: Secondary | ICD-10-CM

## 2021-06-21 DIAGNOSIS — Z89512 Acquired absence of left leg below knee: Secondary | ICD-10-CM

## 2021-06-21 DIAGNOSIS — T8781 Dehiscence of amputation stump: Secondary | ICD-10-CM

## 2021-06-24 ENCOUNTER — Encounter: Payer: Self-pay | Admitting: Orthopedic Surgery

## 2021-06-24 ENCOUNTER — Other Ambulatory Visit: Payer: Self-pay | Admitting: Family

## 2021-06-24 NOTE — Progress Notes (Signed)
? ?Office Visit Note ?  ?Patient: [name]           ?Date of Birth: 1950/12/19           ?MRN: [id number] ?Visit Date: 06/21/2021 ?             ?Requested by: Lorenda Hatchet, [place] ?[id number] ?[address] ?[place],  [place] [id number] ?PCP: Lorenda Hatchet, FNP ? ?Chief Complaint  ?Patient presents with  ? Left Leg - Routine Post Op  ?  05/09/2021 left BKA   ? ? ? ? ?HPI: ?Patient is a 71 year old gentleman who is 6 weeks status post left below-knee amputation.  The staples were harvested on the last visit.  He is currently wearing his limb protector.  He is on Augmentin. ? ?Assessment & Plan: ?Visit Diagnoses:  ?1. Dehiscence of amputation stump of left lower extremity ([place])   ?2. Below-knee amputation of left lower extremity ([place])   ? ? ?Plan: With the progressive ischemic changes to the wound on the transtibial amputation I have recommended proceeding with a revision of the amputation.  Risks and benefits were discussed including risk of the wound healing.  Plan for surgery on Wednesday. ? ?Follow-Up Instructions: Return in about 2 weeks (around 07/05/2021).  ? ?Ortho Exam ? ?Patient is alert, oriented, no adenopathy, well-dressed, normal affect, normal respiratory effort. ?Examination patient has progressive wound dehiscence over the lateral residual limb there is no ascending cellulitis no purulent drainage.  The muscle and soft tissue is not viable at the wound bed. ? ?Imaging: ?No results found. ? ? ? ?Labs: ?Lab Results  ?Component Value Date  ? HGBA1C 5.5 05/04/2021  ? HGBA1C 6.3 (H) 02/02/2021  ? HGBA1C 5.4 09/13/2020  ? ESRSEDRATE 34 (H) 01/24/2020  ? ESRSEDRATE 41 (H) 01/10/2020  ? ESRSEDRATE 55 (H) 12/27/2019  ? CRP 8.8 (H) 01/24/2020  ? CRP 35.9 (H) 01/10/2020  ? CRP 77.9 (H) 12/27/2019  ? REPTSTATUS 02/05/2021 FINAL 02/02/2021  ? GRAMSTAIN  02/02/2021  ?  NO WBC SEEN ?RARE GRAM VARIABLE COCCI ?RARE GRAM NEGATIVE RODS ?  ? CULT  02/02/2021  ?  RARE ENTEROCOCCUS FAECALIS ?RARE  DIPHTHEROIDS(CORYNEBACTERIUM SPECIES) ?Standardized susceptibility testing for this organism is not available. ?FEW BACTEROIDES FRAGILIS ?BETA LACTAMASE POSITIVE ?Performed at [facility], [place] [address] [place], [place] [address] ?  ? LABORGA ENTEROCOCCUS FAECALIS 02/02/2021  ? ? ? ?Lab Results  ?Component Value Date  ? ALBUMIN 3.7 05/23/2021  ? ALBUMIN 3.7 05/21/2021  ? ALBUMIN 3.4 (L) 05/18/2021  ? PREALBUMIN 32.7 05/09/2021  ? PREALBUMIN 14.8 (L) 12/14/2018  ? ? ?Lab Results  ?Component Value Date  ? MG 2.4 05/14/2021  ? MG 1.9 05/09/2021  ? MG 2.0 06/28/2020  ? ?Lab Results  ?Component Value Date  ? VD25OH 16.13 (L) 05/09/2021  ? ? ?Lab Results  ?Component Value Date  ? PREALBUMIN 32.7 05/09/2021  ? PREALBUMIN 14.8 (L) 12/14/2018  ? ?CBC EXTENDED Latest Ref Rng & Units 05/23/2021 05/21/2021 05/18/2021  ?WBC 4.0 - 10.5 K/uL 7.7 7.0 5.3  ?RBC 4.22 - 5.81 MIL/uL 3.27(L) 3.34(L) 2.76(L)  ?HGB 13.0 - 17.0 g/dL 9.6(L) 10.2(L) 8.3(L)  ?HCT 39.0 - 52.0 % 29.8(L) 30.1(L) 25.1(L)  ?PLT 150 - 400 K/uL 168 184 144(L)  ?NEUTROABS 1.7 - 7.7 K/uL - - -  ?LYMPHSABS 0.7 - 4.0 K/uL - - -  ? ? ? ?There is no height or weight on file to calculate BMI. ? ?Orders:  ?No  orders of the defined types were placed in this encounter. ? ?No orders of the defined types were placed in this encounter. ? ? ? Procedures: ?No procedures performed ? ?Clinical Data: ?No additional findings. ? ?ROS: ? ?All other systems negative, except as noted in the HPI. ?Review of Systems ? ?Objective: ?Vital Signs: There were no vitals taken for this visit. ? ?Specialty Comments:  ?No specialty comments available. ? ?PMFS History: ?Patient Active Problem List  ? Diagnosis Date Noted  ? Left below-knee amputee ([place]) 05/11/2021  ? S/P BKA (below knee amputation) unilateral, left ([place]) 05/09/2021  ? Partial nontraumatic amputation of foot, left ([name]) 02/20/2021  ? Abscess of bursa of left foot 02/03/2021  ? Cutaneous abscess of left foot   ? Acute  osteomyelitis of metatarsal bone of left foot (HCC)   ? Symptomatic anemia 09/13/2020  ? COVID-19 virus infection 09/13/2020  ? Pressure injury of skin 06/29/2020  ? Gross hematuria 06/16/2020  ? Typical atrial flutter ([place]) 03/24/2020  ? Bilateral pleural effusion 02/24/2020  ? Healthcare maintenance 01/28/2020  ? Shortness of breath 01/28/2020  ? History of partial ray amputation of fourth toe of right foot ([place]) 09/08/2019  ? Ulcerated, foot, right, with necrosis of bone ([place])   ? Chronic cough 06/25/2019  ? Cutaneous abscess of right foot   ? Subacute osteomyelitis of right foot ([place]) 12/14/2018  ? AKI (acute kidney injury) ([place]) 12/14/2018  ? ESRD (end stage renal disease) ([place]) 12/14/2018  ? Gait abnormality 08/24/2018  ? Diabetic neuropathy ([place]) 02/06/2018  ? Cervical myelopathy ([place]) 02/06/2018  ? Onychomycosis 10/30/2017  ? Other spondylosis with radiculopathy, cervical region 01/27/2017  ? Midfoot ulcer, right, limited to breakdown of skin ([place]) 11/15/2016  ? Lateral epicondylitis, left elbow 08/12/2016  ? Cellulitis of fifth toe of right foot 07/29/2016  ? Prostate cancer ([place]) 06/19/2016  ? Non-pressure chronic ulcer of other part of right foot limited to breakdown of skin ([place]) 04/04/2016  ? Cellulitis of leg, right 12/24/2015  ? Cellulitis of right lower extremity 12/24/2015  ? Gout 09/14/2012  ? HTN (hypertension) 09/14/2012  ? Hypothyroidism 09/14/2012  ? CAD (coronary artery disease) 09/14/2012  ? Diabetes mellitus ([place]) 09/14/2012  ? Hypercholesteremia   ? Neuropathy ([place])   ? ?Past Medical History:  ?Diagnosis Date  ? Ambulates with cane   ? straight cane  ? Cervical myelopathy ([place]) 02/06/2018  ? Chronic kidney disease   ? 32 M W F- home  ? Complication of anesthesia   ? Coronary artery disease   ? Diabetes mellitus without complication ([place])   ? type2  ? Diabetic foot ulcer ([place])   ? Diabetic neuropathy ([place]) 02/06/2018  ? Gait abnormality 08/24/2018  ? GERD (gastroesophageal reflux  disease)   ?  01/06/20- not current  ? Gout   ? History of blood transfusion   ? History of kidney stones   ? passed stones  ? Hypercholesteremia   ? Hypertension   ? Hypothyroidism   ? Neuromuscular disorder ([place])   ? neuropathy left leg and bilateral feet  ? Neuropathy   ? PONV (postoperative nausea and vomiting)   ? Prostate cancer ([place])   ? PVD (peripheral vascular disease) ([place])   ? with amputations  ?  ?Family History  ?Problem Relation Age of Onset  ? Diabetes Mellitus II Mother   ? Kidney disease Mother   ? Diabetes Mellitus II Father   ? CAD Father   ? Cancer Father   ?  prostate  ? Kidney disease Father   ? Diabetes Mellitus II Brother   ? Kidney disease Brother   ? Diabetes Mellitus II Brother   ? Stomach cancer Brother 82  ? Kidney disease Brother   ? Colon cancer Neg Hx   ? Colon polyps Neg Hx   ? Esophageal cancer Neg Hx   ? Rectal cancer Neg Hx   ? Pancreatic cancer Neg Hx   ?  ?Past Surgical History:  ?Procedure Laterality Date  ? A-FLUTTER ABLATION N/A 04/06/2020  ? Procedure: A-FLUTTER ABLATION;  Surgeon: [name], MD;  Location: [facility];  Service: Cardiovascular;  Laterality: N/A;  ? AMPUTATION Left 12/25/2013  ? Procedure: AMPUTATION RAY LEFT 5TH RAY;  Surgeon: [name], MD;  [facility];  Service: Orthopedics;  Laterality: Left;  ? AMPUTATION Right 12/15/2018  ? Procedure: AMPUTATION OF 4TH AND 5TH TOES RIGHT FOOT;  Surgeon: [name], MD;  Location: [facility];  Service: Orthopedics;  Laterality: Right;  ? AMPUTATION Left 02/02/2021  ? Procedure: LEFT FOOT 4TH RAY AMPUTATION;  Surgeon: [name], MD;  Location: [facility];  Service: Orthopedics;  Laterality: Left;  ? AMPUTATION Left 05/09/2021  ? Procedure: LEFT BELOW KNEE AMPUTATION;  Surgeon: [name], MD;  Location: [facility];  Service: Orthopedics;  Laterality: Left;  ? APPLICATION OF WOUND VAC Right 12/15/2018  ? Procedure: APPLICATION OF WOUND VAC;  Surgeon: [name], MD;  Location: [facility];  Service:  Orthopedics;  Laterality: Right;  ? AV FISTULA PLACEMENT Left 03/25/2019  ? Procedure: LEFT ARM ARTERIOVENOUS (AV) FISTULA CREATION;  Surgeon: [name], MD;  Location: [facility];  Service: Vascular;  Laterality: Left;  ? BACK SURGERY    ? B

## 2021-06-26 ENCOUNTER — Other Ambulatory Visit: Payer: Self-pay

## 2021-06-26 ENCOUNTER — Encounter (HOSPITAL_COMMUNITY): Payer: Self-pay | Admitting: Orthopedic Surgery

## 2021-06-26 MED ORDER — SODIUM CHLORIDE 0.9 % IV SOLN: 100.0000 mL | INTRAVENOUS | Status: DC | PRN

## 2021-06-26 MED ORDER — LIDOCAINE HCL (PF) 1 % IJ SOLN: 5.0000 mL | INTRAMUSCULAR | Status: DC | PRN

## 2021-06-26 MED ORDER — HEPARIN SODIUM (PORCINE) 1000 UNIT/ML DIALYSIS: 40.0000 [IU]/kg | INTRAMUSCULAR | Status: DC | PRN

## 2021-06-26 MED ORDER — PENTAFLUOROPROP-TETRAFLUOROETH EX AERO: 1.0000 "application " | INHALATION_SPRAY | CUTANEOUS | Status: DC | PRN

## 2021-06-26 MED ORDER — HEPARIN SODIUM (PORCINE) 1000 UNIT/ML DIALYSIS: 1000.0000 [IU] | INTRAMUSCULAR | Status: DC | PRN

## 2021-06-26 MED ORDER — LIDOCAINE-PRILOCAINE 2.5-2.5 % EX CREA: 1.0000 "application " | TOPICAL_CREAM | CUTANEOUS | Status: DC | PRN

## 2021-06-26 MED ORDER — ALTEPLASE 2 MG IJ SOLR: 2.0000 mg | Freq: Once | INTRAMUSCULAR | Status: DC | PRN

## 2021-06-26 NOTE — Progress Notes (Addendum)
[name] denies chest pain or shortness of breath.  Patient denies having any s/s of Covid in his household.  Patient denies any known exposure to Covid.  ? ?[name]'s ?PCP is Flora Lipps, NP. ? ?Cardiologist is Dr Einar Gip. ? ?[name] has type II diabetes.  Last A1C was 5.5 on 05/04/21.  [name] reports that CBGs run 150.  I instructed patient to check CBG after awaking and every 2 hours until arrival  to the hospital. I Instructed patient if CBG is less than 70 to take 4 Glucose Tablets or 1 tube of Glucose Gel or 1/2 cup of a clear juice. Recheck CBG in 15 minutes if CBG is not over 70 call, pre- op desk at [phone] for further instructions.  ? ?I instructed [name] shower with antibacteria soap.   No nail polish, artificial or acrylic nails. Wear clean clothes, brush your teeth. ?Glasses, contact lens,dentures or partials may not be worn in the OR. If you need to wear them, please bring a case for glasses, do not wear contacts or bring a case, the hospital does not have contact cases, dentures or partials will have to be removed , make sure they are clean, we will provide a denture cup to put them in. You will need some one to drive you home and a responsible person over the age of 22 to stay with you for the first 24 hours after surgery.  ? ?I spoke with [place] with [employer], he said they will be here. ? ? ?

## 2021-06-27 ENCOUNTER — Inpatient Hospital Stay (HOSPITAL_COMMUNITY): Payer: Medicare Other | Admitting: Certified Registered Nurse Anesthetist

## 2021-06-27 ENCOUNTER — Encounter (HOSPITAL_COMMUNITY)
Admission: RE | Disposition: A | Discharge status: Home / Self Care | Payer: Self-pay | Source: Home / Self Care | Attending: Orthopedic Surgery

## 2021-06-27 ENCOUNTER — Inpatient Hospital Stay (HOSPITAL_BASED_OUTPATIENT_CLINIC_OR_DEPARTMENT_OTHER): Payer: Medicare Other | Admitting: Certified Registered Nurse Anesthetist

## 2021-06-27 ENCOUNTER — Ambulatory Visit (HOSPITAL_COMMUNITY)
Admission: RE | Admit: 2021-06-27 | Discharge: 2021-06-27 | Disposition: A | Discharge status: Home / Self Care | Payer: Medicare Other | Attending: Orthopedic Surgery | Admitting: Orthopedic Surgery

## 2021-06-27 ENCOUNTER — Other Ambulatory Visit: Payer: Self-pay

## 2021-06-27 ENCOUNTER — Encounter (HOSPITAL_COMMUNITY): Payer: Self-pay | Admitting: Orthopedic Surgery

## 2021-06-27 DIAGNOSIS — Z87891 Personal history of nicotine dependence: Secondary | ICD-10-CM | POA: Insufficient documentation

## 2021-06-27 DIAGNOSIS — M1990 Unspecified osteoarthritis, unspecified site: Secondary | ICD-10-CM | POA: Insufficient documentation

## 2021-06-27 DIAGNOSIS — D759 Disease of blood and blood-forming organs, unspecified: Secondary | ICD-10-CM | POA: Diagnosis not present

## 2021-06-27 DIAGNOSIS — E1122 Type 2 diabetes mellitus with diabetic chronic kidney disease: Secondary | ICD-10-CM | POA: Diagnosis not present

## 2021-06-27 DIAGNOSIS — D649 Anemia, unspecified: Secondary | ICD-10-CM | POA: Insufficient documentation

## 2021-06-27 DIAGNOSIS — I120 Hypertensive chronic kidney disease with stage 5 chronic kidney disease or end stage renal disease: Secondary | ICD-10-CM | POA: Insufficient documentation

## 2021-06-27 DIAGNOSIS — T8781 Dehiscence of amputation stump: Secondary | ICD-10-CM

## 2021-06-27 DIAGNOSIS — G709 Myoneural disorder, unspecified: Secondary | ICD-10-CM | POA: Diagnosis not present

## 2021-06-27 DIAGNOSIS — E1151 Type 2 diabetes mellitus with diabetic peripheral angiopathy without gangrene: Secondary | ICD-10-CM | POA: Diagnosis not present

## 2021-06-27 DIAGNOSIS — M109 Gout, unspecified: Secondary | ICD-10-CM | POA: Diagnosis not present

## 2021-06-27 DIAGNOSIS — Y835 Amputation of limb(s) as the cause of abnormal reaction of the patient, or of later complication, without mention of misadventure at the time of the procedure: Secondary | ICD-10-CM | POA: Insufficient documentation

## 2021-06-27 DIAGNOSIS — N186 End stage renal disease: Secondary | ICD-10-CM

## 2021-06-27 DIAGNOSIS — I2510 Atherosclerotic heart disease of native coronary artery without angina pectoris: Secondary | ICD-10-CM | POA: Diagnosis not present

## 2021-06-27 DIAGNOSIS — I4891 Unspecified atrial fibrillation: Secondary | ICD-10-CM | POA: Insufficient documentation

## 2021-06-27 DIAGNOSIS — K219 Gastro-esophageal reflux disease without esophagitis: Secondary | ICD-10-CM | POA: Insufficient documentation

## 2021-06-27 DIAGNOSIS — Z951 Presence of aortocoronary bypass graft: Secondary | ICD-10-CM | POA: Insufficient documentation

## 2021-06-27 DIAGNOSIS — Z992 Dependence on renal dialysis: Secondary | ICD-10-CM

## 2021-06-27 HISTORY — PX: STUMP REVISION: SHX6102

## 2021-06-27 HISTORY — DX: Anemia, unspecified: D64.9

## 2021-06-27 LAB — POCT I-STAT, CHEM 8
BUN: 35 mg/dL — ABNORMAL HIGH (ref 8–23)
Calcium, Ion: 1.12 mmol/L — ABNORMAL LOW (ref 1.15–1.40)
Chloride: 94 mmol/L — ABNORMAL LOW (ref 98–111)
Creatinine, Ser: 5.9 mg/dL — ABNORMAL HIGH (ref 0.61–1.24)
Glucose, Bld: 142 mg/dL — ABNORMAL HIGH (ref 70–99)
HCT: 29 % — ABNORMAL LOW (ref 39.0–52.0)
Hemoglobin: 9.9 g/dL — ABNORMAL LOW (ref 13.0–17.0)
Potassium: 3.9 mmol/L (ref 3.5–5.1)
Sodium: 134 mmol/L — ABNORMAL LOW (ref 135–145)
TCO2: 30 mmol/L (ref 22–32)

## 2021-06-27 LAB — GLUCOSE, CAPILLARY
Glucose-Capillary: 136 mg/dL — ABNORMAL HIGH (ref 70–99)
Glucose-Capillary: 137 mg/dL — ABNORMAL HIGH (ref 70–99)

## 2021-06-27 SURGERY — REVISION, AMPUTATION SITE
Anesthesia: Regional | Laterality: Left

## 2021-06-27 MED ORDER — OXYCODONE HCL 5 MG/5ML PO SOLN: 5.0000 mg | Freq: Once | ORAL | Status: DC | PRN

## 2021-06-27 MED ORDER — ONDANSETRON HCL 4 MG/2ML IJ SOLN
INTRAMUSCULAR | Status: DC | PRN
  Administered 2021-06-27: 4 mg via INTRAVENOUS

## 2021-06-27 MED ORDER — FENTANYL CITRATE (PF) 100 MCG/2ML IJ SOLN
INTRAMUSCULAR | Status: AC
  Administered 2021-06-27: 50 ug via INTRAVENOUS
  Filled 2021-06-27: qty 2

## 2021-06-27 MED ORDER — ORAL CARE MOUTH RINSE: 15.0000 mL | Freq: Once | OROMUCOSAL | Status: AC

## 2021-06-27 MED ORDER — CHLORHEXIDINE GLUCONATE 0.12 % MT SOLN
15.0000 mL | Freq: Once | OROMUCOSAL | Status: AC
  Administered 2021-06-27: 15 mL via OROMUCOSAL
  Filled 2021-06-27: qty 15

## 2021-06-27 MED ORDER — FENTANYL CITRATE (PF) 100 MCG/2ML IJ SOLN
INTRAMUSCULAR | Status: AC
  Filled 2021-06-27: qty 2

## 2021-06-27 MED ORDER — 0.9 % SODIUM CHLORIDE (POUR BTL) OPTIME
TOPICAL | Status: DC | PRN
  Administered 2021-06-27: 1000 mL

## 2021-06-27 MED ORDER — CEFAZOLIN SODIUM-DEXTROSE 2-4 GM/100ML-% IV SOLN
INTRAVENOUS | Status: AC
Start: 2021-06-27 — End: 2021-06-27
  Filled 2021-06-27: qty 100

## 2021-06-27 MED ORDER — PROPOFOL 500 MG/50ML IV EMUL
INTRAVENOUS | Status: DC | PRN
  Administered 2021-06-27: 100 ug/kg/min via INTRAVENOUS

## 2021-06-27 MED ORDER — FENTANYL CITRATE (PF) 100 MCG/2ML IJ SOLN
25.0000 ug | INTRAMUSCULAR | Status: DC | PRN
  Administered 2021-06-27 (×2): 50 ug via INTRAVENOUS
  Administered 2021-06-27: 25 ug via INTRAVENOUS

## 2021-06-27 MED ORDER — CHLORHEXIDINE GLUCONATE 4 % EX LIQD: 60.0000 mL | Freq: Once | CUTANEOUS | Status: DC

## 2021-06-27 MED ORDER — ONDANSETRON HCL 4 MG/2ML IJ SOLN
INTRAMUSCULAR | Status: AC
  Filled 2021-06-27: qty 2

## 2021-06-27 MED ORDER — ACETAMINOPHEN 500 MG PO TABS
1000.0000 mg | ORAL_TABLET | Freq: Once | ORAL | Status: DC
  Filled 2021-06-27: qty 2

## 2021-06-27 MED ORDER — INSULIN ASPART 100 UNIT/ML IJ SOLN: 0.0000 [IU] | INTRAMUSCULAR | Status: DC | PRN

## 2021-06-27 MED ORDER — BUPIVACAINE LIPOSOME 1.3 % IJ SUSP
INTRAMUSCULAR | Status: DC | PRN
  Administered 2021-06-27: 10 mL

## 2021-06-27 MED ORDER — MIDAZOLAM HCL 2 MG/2ML IJ SOLN
INTRAMUSCULAR | Status: AC
  Administered 2021-06-27: 1 mg via INTRAVENOUS
  Filled 2021-06-27: qty 2

## 2021-06-27 MED ORDER — PROPOFOL 10 MG/ML IV BOLUS
INTRAVENOUS | Status: DC | PRN
  Administered 2021-06-27: 10 mg via INTRAVENOUS

## 2021-06-27 MED ORDER — POVIDONE-IODINE 10 % EX SWAB: 2.0000 "application " | Freq: Once | CUTANEOUS | Status: DC

## 2021-06-27 MED ORDER — PHENYLEPHRINE 40 MCG/ML (10ML) SYRINGE FOR IV PUSH (FOR BLOOD PRESSURE SUPPORT)
PREFILLED_SYRINGE | INTRAVENOUS | Status: AC
  Filled 2021-06-27: qty 10

## 2021-06-27 MED ORDER — AMISULPRIDE (ANTIEMETIC) 5 MG/2ML IV SOLN: 10.0000 mg | Freq: Once | INTRAVENOUS | Status: DC | PRN

## 2021-06-27 MED ORDER — SODIUM CHLORIDE 0.9 % IV BOLUS
500.0000 mL | Freq: Once | INTRAVENOUS | Status: AC
  Administered 2021-06-27: 500 mL via INTRAVENOUS

## 2021-06-27 MED ORDER — EPHEDRINE SULFATE (PRESSORS) 50 MG/ML IJ SOLN
INTRAMUSCULAR | Status: DC | PRN
  Administered 2021-06-27 (×2): 2.5 mg via INTRAVENOUS
  Administered 2021-06-27 (×2): 5 mg via INTRAVENOUS

## 2021-06-27 MED ORDER — ALBUMIN HUMAN 5 % IV SOLN: INTRAVENOUS | Status: DC | PRN

## 2021-06-27 MED ORDER — FENTANYL CITRATE (PF) 100 MCG/2ML IJ SOLN: 50.0000 ug | Freq: Once | INTRAMUSCULAR | Status: AC

## 2021-06-27 MED ORDER — FENTANYL CITRATE (PF) 250 MCG/5ML IJ SOLN
INTRAMUSCULAR | Status: AC
  Filled 2021-06-27: qty 5

## 2021-06-27 MED ORDER — CEFAZOLIN SODIUM-DEXTROSE 2-4 GM/100ML-% IV SOLN
2.0000 g | INTRAVENOUS | Status: AC
  Administered 2021-06-27: 2 g via INTRAVENOUS

## 2021-06-27 MED ORDER — SODIUM CHLORIDE 0.9 % IV SOLN: INTRAVENOUS | Status: DC

## 2021-06-27 MED ORDER — ONDANSETRON HCL 4 MG/2ML IJ SOLN: 4.0000 mg | Freq: Once | INTRAMUSCULAR | Status: DC | PRN

## 2021-06-27 MED ORDER — MIDAZOLAM HCL 2 MG/2ML IJ SOLN: 1.0000 mg | Freq: Once | INTRAMUSCULAR | Status: AC

## 2021-06-27 MED ORDER — PHENYLEPHRINE 40 MCG/ML (10ML) SYRINGE FOR IV PUSH (FOR BLOOD PRESSURE SUPPORT)
PREFILLED_SYRINGE | INTRAVENOUS | Status: DC | PRN
  Administered 2021-06-27 (×2): 40 ug via INTRAVENOUS
  Administered 2021-06-27 (×2): 80 ug via INTRAVENOUS
  Administered 2021-06-27 (×2): 40 ug via INTRAVENOUS

## 2021-06-27 MED ORDER — KETAMINE HCL 10 MG/ML IJ SOLN
INTRAMUSCULAR | Status: DC | PRN
  Administered 2021-06-27: 10 mg via INTRAVENOUS

## 2021-06-27 MED ORDER — OXYCODONE HCL 5 MG PO TABS: 5.0000 mg | ORAL_TABLET | Freq: Once | ORAL | Status: DC | PRN

## 2021-06-27 MED ORDER — BUPIVACAINE HCL (PF) 0.5 % IJ SOLN
INTRAMUSCULAR | Status: DC | PRN
  Administered 2021-06-27 (×2): 15 mL via PERINEURAL

## 2021-06-27 MED ORDER — KETAMINE HCL 50 MG/5ML IJ SOSY
PREFILLED_SYRINGE | INTRAMUSCULAR | Status: AC
  Filled 2021-06-27: qty 5

## 2021-06-27 MED ORDER — EPHEDRINE 5 MG/ML INJ
INTRAVENOUS | Status: AC
  Filled 2021-06-27: qty 5

## 2021-06-27 SURGICAL SUPPLY — 32 items
BAG COUNTER SPONGE SURGICOUNT (BAG) ×1 IMPLANT
BLADE SAW RECIP 87.9 MT (BLADE) ×1 IMPLANT
BLADE SURG 21 STRL SS (BLADE) ×2 IMPLANT
BNDG COHESIVE 6X5 TAN NS LF (GAUZE/BANDAGES/DRESSINGS) ×1 IMPLANT
CANISTER WOUND CARE 500ML ATS (WOUND CARE) ×1 IMPLANT
COVER SURGICAL LIGHT HANDLE (MISCELLANEOUS) ×2 IMPLANT
DRAPE EXTREMITY T 121X128X90 (DISPOSABLE) ×2 IMPLANT
DRAPE HALF SHEET 40X57 (DRAPES) ×1 IMPLANT
DRAPE INCISE IOBAN 66X45 STRL (DRAPES) ×1 IMPLANT
DRAPE U-SHAPE 47X51 STRL (DRAPES) ×4 IMPLANT
DRESSING PREVENA PLUS CUSTOM (GAUZE/BANDAGES/DRESSINGS) ×1 IMPLANT
DRSG PREVENA PLUS CUSTOM (GAUZE/BANDAGES/DRESSINGS) ×2
DURAPREP 26ML APPLICATOR (WOUND CARE) ×2 IMPLANT
ELECT REM PT RETURN 9FT ADLT (ELECTROSURGICAL) ×2
ELECTRODE REM PT RTRN 9FT ADLT (ELECTROSURGICAL) ×1 IMPLANT
GLOVE SURG ORTHO LTX SZ9 (GLOVE) ×2 IMPLANT
GLOVE SURG UNDER POLY LF SZ9 (GLOVE) ×2 IMPLANT
GOWN STRL REUS W/ TWL XL LVL3 (GOWN DISPOSABLE) ×2 IMPLANT
GOWN STRL REUS W/TWL XL LVL3 (GOWN DISPOSABLE) ×2
GRAFT SKIN WND MICRO 38 (Tissue) ×1 IMPLANT
KIT BASIN OR (CUSTOM PROCEDURE TRAY) ×2 IMPLANT
KIT TURNOVER KIT B (KITS) ×2 IMPLANT
MANIFOLD NEPTUNE II (INSTRUMENTS) ×2 IMPLANT
NS IRRIG 1000ML POUR BTL (IV SOLUTION) ×2 IMPLANT
PACK GENERAL/GYN (CUSTOM PROCEDURE TRAY) ×2 IMPLANT
PAD ARMBOARD 7.5X6 YLW CONV (MISCELLANEOUS) ×2 IMPLANT
PREVENA RESTOR ARTHOFORM 46X30 (CANNISTER) ×2 IMPLANT
STAPLER VISISTAT 35W (STAPLE) ×1 IMPLANT
SUT ETHILON 2 0 PSLX (SUTURE) ×4 IMPLANT
SUT SILK 2 0 (SUTURE)
SUT SILK 2-0 18XBRD TIE 12 (SUTURE) IMPLANT
TOWEL GREEN STERILE (TOWEL DISPOSABLE) ×2 IMPLANT

## 2021-06-27 NOTE — Transfer of Care (Signed)
Immediate Anesthesia Transfer of Care Note ? ?Patient: [name] ? ?Procedure(s) Performed: REVISION LEFT BELOW KNEE AMPUTATION (Left) ? ?Patient Location: PACU ? ?Anesthesia Type:MAC and Regional ? ?Level of Consciousness: drowsy ? ?Airway & Oxygen Therapy: Patient Spontanous Breathing and Patient connected to face mask oxygen ? ?Post-op Assessment: Report given to RN and Post -op Vital signs reviewed and stable ? ?Post vital signs: Reviewed and stable ? ?Last Vitals:  ?Vitals Value Taken Time  ?BP 101/53 06/27/21 1015  ?Temp    ?Pulse 56 06/27/21 1015  ?Resp 11 06/27/21 1015  ?SpO2 100 % 06/27/21 1015  ?Vitals shown include unvalidated device data. ? ?Last Pain:  ?Vitals:  ? 06/27/21 0743  ?TempSrc:   ?PainSc: 1   ?   ? ?Patients Stated Pain Goal: 0 (06/27/21 0743) ? ?Complications: No notable events documented. ?

## 2021-06-27 NOTE — Anesthesia Procedure Notes (Addendum)
Anesthesia Regional Block: Adductor canal block  ? ?Pre-Anesthetic Checklist: , timeout performed,  Correct Patient, Correct Site, Correct Laterality,  Correct Procedure, Correct Position, site marked,  Risks and benefits discussed,  Surgical consent,  Pre-op evaluation,  At surgeon's request and post-op pain management ? ?Laterality: Left ? ?Prep: chloraprep     ?  ?Needles:  ?Injection technique: Single-shot ? ?Needle Type: Echogenic Stimulator Needle   ? ? ?Needle Length: 10cm  ?Needle Gauge: 20  ? ? ? ?Additional Needles: ? ? ?Procedures:,,,, ultrasound used (permanent image in chart),,    ?Narrative:  ?Start time: 06/27/2021 8:10 AM ?End time: 06/27/2021 8:14 AM ?Injection made incrementally with aspirations every 5 mL. ? ?Performed by: Personally  ?Anesthesiologist: Lidia Collum, MD ? ?Additional Notes: ?Standard monitors applied. Skin prepped. Good needle visualization with ultrasound. Injection made in 5cc increments with no resistance to injection. Patient tolerated the procedure well. ? ? ? ? ?

## 2021-06-27 NOTE — Anesthesia Procedure Notes (Signed)
Procedure Name: [place] ?Date/Time: 06/27/2021 9:27 AM ?Performed by: Janene Harvey, CRNA ?Pre-anesthesia Checklist: Patient identified, Emergency Drugs available, Suction available and Patient being monitored ?Patient Re-evaluated:Patient Re-evaluated prior to induction ?Oxygen Delivery Method: Simple face mask ?Induction Type: IV induction ?Placement Confirmation: positive ETCO2 ?Dental Injury: Teeth and Oropharynx as per pre-operative assessment  ? ? ? ? ?

## 2021-06-27 NOTE — H&P (Signed)
[name] is an 71 y.o. male.   ?Chief Complaint: Painful dehiscence left below-knee amputation. ?HPI: Patient is a 71 year old gentleman with type 2 diabetes end-stage renal disease on dialysis who has peripheral vascular disease.  Patient has undergone a transtibial amputation on the left.  Patient has had progressive wound dehiscence of the transtibial amputation and has a wound that extends down to bone.  At this time patient presents for revision of the amputation. ? ?Past Medical History:  ?Diagnosis Date  ? Ambulates with cane   ? straight cane  ? Anemia   ? Cervical myelopathy ([place]) 02/06/2018  ? Chronic kidney disease   ? 26 M W F- home  ? Complication of anesthesia   ? Coronary artery disease   ? Diabetes mellitus without complication ([place])   ? type2  ? Diabetic foot ulcer ([place])   ? Diabetic neuropathy ([place]) 02/06/2018  ? Gait abnormality 08/24/2018  ? GERD (gastroesophageal reflux disease)   ?  01/06/20- not current  ? Gout   ? History of blood transfusion   ? History of blood transfusion   ? History of kidney stones   ? passed stones  ? Hypercholesteremia   ? Hypertension   ? Hypothyroidism   ? Neuromuscular disorder ([place])   ? neuropathy left leg and bilateral feet  ? Neuropathy   ? PONV (postoperative nausea and vomiting)   ? Prostate cancer ([place])   ? PVD (peripheral vascular disease) ([place])   ? with amputations  ? ? ?Past Surgical History:  ?Procedure Laterality Date  ? A-FLUTTER ABLATION N/A 04/06/2020  ? Procedure: A-FLUTTER ABLATION;  Surgeon: [name], MD;  Location: [facility];  Service: Cardiovascular;  Laterality: N/A;  ? AMPUTATION Left 12/25/2013  ? Procedure: AMPUTATION RAY LEFT 5TH RAY;  Surgeon: [name], MD;  [facility];  Service: Orthopedics;  Laterality: Left;  ? AMPUTATION Right 12/15/2018  ? Procedure: AMPUTATION OF 4TH AND 5TH TOES RIGHT FOOT;  Surgeon: [name], MD;  Location: [facility];  Service: Orthopedics;  Laterality: Right;  ? AMPUTATION Left  02/02/2021  ? Procedure: LEFT FOOT 4TH RAY AMPUTATION;  Surgeon: [name], MD;  Location: [facility];  Service: Orthopedics;  Laterality: Left;  ? AMPUTATION Left 05/09/2021  ? Procedure: LEFT BELOW KNEE AMPUTATION;  Surgeon: [name], MD;  Location: [facility];  Service: Orthopedics;  Laterality: Left;  ? APPLICATION OF WOUND VAC Right 12/15/2018  ? Procedure: APPLICATION OF WOUND VAC;  Surgeon: [name], MD;  Location: [facility];  Service: Orthopedics;  Laterality: Right;  ? AV FISTULA PLACEMENT Left 03/25/2019  ? Procedure: LEFT ARM ARTERIOVENOUS (AV) FISTULA CREATION;  Surgeon: [name], MD;  Location: [facility];  Service: Vascular;  Laterality: Left;  ? BACK SURGERY    ? BASCILIC VEIN TRANSPOSITION Left 05/20/2019  ? Procedure: SECOND STAGE LEFT BASCILIC VEIN TRANSPOSITION;  Surgeon: [name], MD;  Location: [facility];  Service: Vascular;  Laterality: Left;  ? CARDIAC CATHETERIZATION  02/17/2014  ? CHOLECYSTECTOMY    ? COLONOSCOPY  2011  ? in [place], Normal  ? [place] GRAFT  2008  ? CYSTOSCOPY N/A 06/29/2020  ? Procedure: CYSTOSCOPY WITH CLOT EVACUATION AND  FULGERATION;  Surgeon: [name], MD;  Location: [facility];  Service: Urology;  Laterality: N/A;  ? CYSTOSCOPY WITH FULGERATION Bilateral 06/17/2020  ? Procedure: CYSTOSCOPY,BILATERAL RETROGRADE, CLOT EVACUATION WITH FULGERATION OF THE BLADDER;  Surgeon: Janith Lima,  MD;  Location: [facility];  Service: Urology;  Laterality: Bilateral;  ? [place] N/A 06/28/2020  ? Procedure: CYSTOSCOPY WITH CLOT EVACUATION AND FULGERATION OF BLEEDERS;  Surgeon: [name], MD;  Location: [facility];  Service: Urology;  Laterality: N/A;  1 HR  ? I & D EXTREMITY Right 12/15/2018  ? Procedure: DEBRIDEMENT RIGHT FOOT;  Surgeon: [name], MD;  Location: [facility];  Service: Orthopedics;  Laterality: Right;  ? I & D EXTREMITY Right 08/27/2019  ? Procedure: PARTIAL CUBOID EXCISION RIGHT FOOT;  Surgeon: [name], MD;  Location:  [facility];  Service: Orthopedics;  Laterality: Right;  ? I & D EXTREMITY Right 01/07/2020  ? Procedure: RIGHT FOOT EXCISION INFECTED BONE;  Surgeon: [name], MD;  Location: [facility];  Service: Orthopedics;  Laterality: Right;  ? NECK SURGERY    ? novemver 2019  ? TRANSURETHRAL RESECTION OF PROSTATE N/A 06/28/2020  ? Procedure: TRANSURETHRAL RESECTION OF THE PROSTATE (TURP);  Surgeon: [name], MD;  Location: [facility];  Service: Urology;  Laterality: N/A;  ? WISDOM TOOTH EXTRACTION    ? ? ?Family History  ?Problem Relation Age of Onset  ? Diabetes Mellitus II Mother   ? Kidney disease Mother   ? Diabetes Mellitus II Father   ? CAD Father   ? Cancer Father   ?     prostate  ? Kidney disease Father   ? Diabetes Mellitus II Brother   ? Kidney disease Brother   ? Diabetes Mellitus II Brother   ? Stomach cancer Brother 69  ? Kidney disease Brother   ? Colon cancer Neg Hx   ? Colon polyps Neg Hx   ? Esophageal cancer Neg Hx   ? Rectal cancer Neg Hx   ? Pancreatic cancer Neg Hx   ? ?Social History:  reports that he quit smoking about 32 years ago. His smoking use included cigars. He started smoking about 48 years ago. He has never used smokeless tobacco. He reports that he does not drink alcohol and does not use drugs. ? ?Allergies:  ?Allergies  ?Allergen Reactions  ? Mushroom Extract Complex Nausea Only  ? ? ?Medications Prior to Admission  ?Medication Sig Dispense Refill  ? acetaminophen (TYLENOL) 500 MG tablet Take 1,000 mg by mouth 3 (three) times daily.    ? allopurinol (ZYLOPRIM) 100 MG tablet Take 2 tablets (200 mg total) by mouth daily. 30 tablet 0  ? amoxicillin-clavulanate (AUGMENTIN) 500-125 MG tablet Take 1 tablet by mouth 3 (three) times daily.    ? B Complex-C-Zn-Folic Acid (DIALYVITE 932 WITH ZINC) 0.8 MG TABS Take 1 tablet by mouth daily. 30 tablet 0  ? calcitRIOL (ROCALTROL) 0.5 MCG capsule Take 1 capsule (0.5 mcg total) by mouth daily. (Patient taking differently: Take 0.5 mcg by mouth every other  day.) 30 capsule 0  ? carvedilol (COREG) 25 MG tablet Take 1 tablet (25 mg total) by mouth 2 (two) times daily with a meal. (Patient taking differently: Take 12.5 mg by mouth 2 (two) times daily with a meal.) 60 tablet 0  ? famotidine-calcium carbonate-magnesium hydroxide (PEPCID COMPLETE) 10-800-165 MG chewable tablet Chew 1 tablet by mouth daily as needed.    ? levothyroxine (SYNTHROID) 112 MCG tablet Take 1 tablet (112 mcg total) by mouth daily before breakfast. 30 tablet 0  ? losartan (COZAAR) 100 MG tablet Take 1 tablet (100 mg total) by mouth daily. 30 tablet 0  ? Polyethyl Glycol-Propyl Glycol (LUBRICANT EYE DROPS) 0.4-0.3 %  SOLN Place 1-2 drops into both eyes in the morning and at bedtime.    ? pregabalin (LYRICA) 150 MG capsule Take 1 capsule by mouth in the morning and 2 capsules at night. 90 capsule 0  ? prochlorperazine (COMPAZINE) 5 MG tablet Take 5 mg by mouth 2 (two) times daily.    ? rosuvastatin (CRESTOR) 10 MG tablet Take 1 tablet (10 mg total) by mouth daily. 30 tablet 0  ? Semaglutide,0.25 or 0.'5MG'$ /DOS, (OZEMPIC, 0.25 OR 0.5 MG/DOSE,) 2 MG/1.5ML SOPN Inject 0.5 mg into the skin every Friday. 1.5 mL 0  ? sevelamer carbonate (RENVELA) 800 MG tablet Take 1 tablet (800 mg total) by mouth 3 (three) times daily with meals. 90 tablet 0  ? amitriptyline (ELAVIL) 25 MG tablet Take 1 tablet (25 mg total) by mouth at bedtime. (Patient not taking: Reported on 06/22/2021) 30 tablet 0  ? amoxicillin-clavulanate (AUGMENTIN) 875-125 MG tablet Take 1 tablet by mouth 2 (two) times daily. (Patient not taking: Reported on 06/22/2021) 30 tablet 0  ? ascorbic acid (VITAMIN C) 1000 MG tablet Take 1 tablet (1,000 mg total) by mouth daily. (Patient not taking: Reported on 06/22/2021) 30 tablet 0  ? Darbepoetin Alfa (ARANESP) 100 MCG/0.5ML SOSY injection Inject 0.5 mLs (100 mcg total) into the vein every Thursday with hemodialysis. 4.2 mL   ? docusate sodium (COLACE) 100 MG capsule Take 1 capsule (100 mg total) by mouth 3  (three) times daily. (Patient not taking: Reported on 06/22/2021) 10 capsule 0  ? insulin aspart (NOVOLOG) 100 UNIT/ML FlexPen Inject 2 Units into the skin 3 (three) times daily with meals. (Patient not takin

## 2021-06-27 NOTE — Anesthesia Postprocedure Evaluation (Signed)
Anesthesia Post Note ? ?Patient: WHITFIELD DULAY ? ?Procedure(s) Performed: REVISION LEFT BELOW KNEE AMPUTATION (Left) ? ?  ? ?Patient location during evaluation: PACU ?Anesthesia Type: Regional ?Level of consciousness: awake and alert ?Pain management: pain level controlled ?Vital Signs Assessment: post-procedure vital signs reviewed and stable ?Respiratory status: spontaneous breathing, nonlabored ventilation and respiratory function stable ?Cardiovascular status: blood pressure returned to baseline and stable ?Postop Assessment: no apparent nausea or vomiting ?Anesthetic complications: no ? ? ?No notable events documented. ? ?Last Vitals:  ?Vitals:  ? 06/27/21 1115 06/27/21 1127  ?BP: (!) 122/54 (!) 118/57  ?Pulse: (!) 59 63  ?Resp: 11 15  ?Temp:  36.7 ?C  ?SpO2: 96% 96%  ?  ?Last Pain:  ?Vitals:  ? 06/27/21 1127  ?TempSrc:   ?PainSc: 5   ? ? ?  ?  ?  ?  ?  ?  ? ?Lidia Collum ? ? ? ? ?

## 2021-06-27 NOTE — Anesthesia Procedure Notes (Addendum)
Anesthesia Regional Block: Popliteal block  ? ?Pre-Anesthetic Checklist: , timeout performed,  Correct Patient, Correct Site, Correct Laterality,  Correct Procedure, Correct Position, site marked,  Risks and benefits discussed,  Surgical consent,  Pre-op evaluation,  At surgeon's request and post-op pain management ? ?Laterality: Left ? ?Prep: chloraprep     ?  ?Needles:  ?Injection technique: Single-shot ? ?Needle Type: Echogenic Stimulator Needle   ? ? ?Needle Length: 10cm  ?Needle Gauge: 20  ? ? ? ?Additional Needles: ? ? ?Procedures:,,,, ultrasound used (permanent image in chart),,    ?Narrative:  ?Start time: 06/27/2021 8:14 AM ?End time: 06/27/2021 8:17 AM ? ?Performed by: Personally  ?Anesthesiologist: Lidia Collum, MD ? ?Additional Notes: ?Standard monitors applied. Skin prepped. Good needle visualization with ultrasound. Injection made in 5cc increments with no resistance to injection. Patient tolerated the procedure well. ? ? ? ? ?

## 2021-06-27 NOTE — Op Note (Signed)
06/27/2021 ? ?10:16 AM ? ?PATIENT:  [name]   ? ?PRE-OPERATIVE DIAGNOSIS:  Dehiscence Left Below Knee Amputation, unexpected dehiscence in the postoperative period. ? ?POST-OPERATIVE DIAGNOSIS:  Same ? ?PROCEDURE:  REVISION LEFT BELOW KNEE AMPUTATION ?Application Prevena wound VAC ? ?SURGEON:  [name], MD ? ?PHYSICIAN ASSISTANT:None ?ANESTHESIA:   General ? ?PREOPERATIVE INDICATIONS:  [name] is a  72 y.o. male with a diagnosis of Dehiscence Left Below Knee Amputation who failed conservative measures and elected for surgical management.   ? ?The risks benefits and alternatives were discussed with the patient preoperatively including but not limited to the risks of infection, bleeding, nerve injury, cardiopulmonary complications, the need for revision surgery, among others, and the patient was willing to proceed. ? ?OPERATIVE IMPLANTS: Kerecis micro powder 38 cm?. ? ?'@ENCIMAGES'$ @ ? ?OPERATIVE FINDINGS: No deep abscess ischemic tissue.  Tissue sent for cultures. ? ?OPERATIVE PROCEDURE: Patient was brought the operating room and underwent a regional anesthetic.  After adequate levels anesthesia were obtained patient's left lower extremity was prepped using DuraPrep draped into a sterile field a timeout was called.  A fishmouth incision was made around the necrotic tissue this was carried sharply down to bone the distal 2 cm of the tibia and fibula were resected.  The tibia was beveled anteriorly.  Ischemic tissue was removed the remaining tissue was healthy and viable good petechial bleeding.  The wound was irrigated with normal saline vascular bundles were suture ligated with 2-0 silk.  The deep and superficial fascial layers and skin was closed using 2-0 nylon.  The Praveena customizable and Arnell Sieving form wound VAC was applied this was covered with derma tack and the stump shrinker this had a good suction fit.  Patient was taken the PACU in stable condition. ? ? ?DISCHARGE PLANNING: ? ?Antibiotic  duration: Preoperative antibiotics ? ?Weightbearing: Nonweightbearing on the left ? ?Pain medication: Patient requests no pain medication ? ?Dressing care/ Wound VAC: Continue wound VAC for 1 week ? ?Ambulatory devices: Walker and wheelchair ? ?Discharge to: Home. ? ?Follow-up: In the office 1 week post operative. ? ? ? ? ? ? ?  ?

## 2021-06-27 NOTE — Anesthesia Preprocedure Evaluation (Signed)
Anesthesia Evaluation  ?Patient identified by MRN, date of birth, ID band ?Patient awake ? ? ? ?Reviewed: ?Allergy & Precautions, NPO status , Patient's Chart, lab work & pertinent test results ? ?Airway ?Mallampati: III ? ?TM Distance: >3 FB ?Neck ROM: Full ? ? ? Dental ?  ?Pulmonary ?former smoker,  ?  ?Pulmonary exam normal ? ? ? ? ? ? ? Cardiovascular ?hypertension, Pt. on medications and Pt. on home beta blockers ?+ CAD, + CABG and + Peripheral Vascular Disease  ?+ dysrhythmias Atrial Fibrillation  ?Rhythm:Irregular Rate:Normal ? ? ?  ?Neuro/Psych ?Cervical myelopathy ?Diabetic neuropathy  ? Neuromuscular disease negative psych ROS  ? GI/Hepatic ?Neg liver ROS, GERD  ,  ?Endo/Other  ?diabetesHypothyroidism  ? Renal/GU ?ESRF and DialysisRenal disease  ? ?  ?Musculoskeletal ? ?(+) Arthritis , Gout  ? Abdominal ?  ?Peds ? Hematology ? ?(+) Blood dyscrasia, anemia ,   ?Anesthesia Other Findings ? ?Dehiscence of amputation stump ? ?Hgb 9.9, K 3.9 ? Reproductive/Obstetrics ? ?  ? ? ? ? ? ? ? ? ? ? ? ? ? ?  ?  ? ? ? ? ? ? ?Anesthesia Physical ? ?Anesthesia Plan ? ?ASA: 3 ? ?Anesthesia Plan: Regional  ? ?Post-op Pain Management: Regional block*  ? ?Induction: Intravenous ? ?PONV Risk Score and Plan: 1 and Ondansetron, Dexamethasone, Propofol infusion and Treatment may vary due to age or medical condition ? ?Airway Management Planned: Simple Face Mask ? ?Additional Equipment:  ? ?Intra-op Plan:  ? ?Post-operative Plan:  ? ?Informed Consent: I have reviewed the patients History and Physical, chart, labs and discussed the procedure including the risks, benefits and alternatives for the proposed anesthesia with the patient or authorized representative who has indicated his/her understanding and acceptance.  ? ? ? ?Dental advisory given ? ?Plan Discussed with: CRNA ? ?Anesthesia Plan Comments:   ? ? ? ? ? ?Anesthesia Quick Evaluation ? ?

## 2021-06-28 ENCOUNTER — Encounter (HOSPITAL_COMMUNITY): Payer: Self-pay | Admitting: Orthopedic Surgery

## 2021-07-02 ENCOUNTER — Other Ambulatory Visit: Payer: Self-pay | Admitting: Orthopedic Surgery

## 2021-07-02 ENCOUNTER — Ambulatory Visit (INDEPENDENT_AMBULATORY_CARE_PROVIDER_SITE_OTHER): Payer: Medicare Other | Admitting: Orthopedic Surgery

## 2021-07-02 ENCOUNTER — Telehealth: Payer: Self-pay | Admitting: Orthopedic Surgery

## 2021-07-02 DIAGNOSIS — T8781 Dehiscence of amputation stump: Secondary | ICD-10-CM

## 2021-07-02 LAB — AEROBIC/ANAEROBIC CULTURE W GRAM STAIN (SURGICAL/DEEP WOUND)

## 2021-07-02 MED ORDER — CIPROFLOXACIN HCL 500 MG PO TABS: 500.0000 mg | ORAL_TABLET | Freq: Every day | ORAL | 0 refills | Status: DC

## 2021-07-02 NOTE — Telephone Encounter (Signed)
Called pt to advise that per Dr. [name] cultures were reviewed and he should stop taking Augmentin and start taking Cipro. This was called into the pharmacy for the pt. He will take one a day and on his dialysis days make sure to take after his treatment. Pt verbalized understanding and will call with any questions.  ?

## 2021-07-02 NOTE — Telephone Encounter (Signed)
I called pt and made an appt for today ?

## 2021-07-02 NOTE — Telephone Encounter (Signed)
Pt called stating he fell and possible bust a stitch. Please call pt at as soon as possible at [phone]. ?

## 2021-07-03 ENCOUNTER — Telehealth: Payer: Self-pay | Admitting: Orthopedic Surgery

## 2021-07-03 NOTE — Telephone Encounter (Signed)
Pt 's wife Remo Lipps was told to call Dr. [name] office and ask for verbal orders be sent to [facility] for skilled nursing wound care. Please fax orders to [phone] ?

## 2021-07-04 ENCOUNTER — Encounter: Payer: Self-pay | Admitting: Orthopedic Surgery

## 2021-07-04 NOTE — Telephone Encounter (Signed)
I spoke with Remo Lipps, she says that [facility] has [facility] out. They told her that she needed to have nurse come out to check in on his leg. Currently has PT and that is out of their range of course. Remo Lipps said that Dr.Duda told them a nurse needs to come out everyday to look at the leg and wrap it. I will send in an order to have that completed. ?

## 2021-07-04 NOTE — Telephone Encounter (Signed)
Faxed order

## 2021-07-04 NOTE — Progress Notes (Signed)
? ?Office Visit Note ?  ?Patient: [name]           ?Date of Birth: 1950/08/19           ?MRN: [id number] ?Visit Date: 07/02/2021 ?             ?Requested by: Lorenda Hatchet, [place] ?[phone] ?[address] ?[place],  [place] [address] ?PCP: Lorenda Hatchet, FNP ? ?Chief Complaint  ?Patient presents with  ? Left Leg - Routine Post Op  ?  06/27/21 revision left BKA with kerecis micro powder ?S/p fall direct impact   ? ? ? ? ?HPI: ?Patient is a 71 year old gentleman who presents 1 week status post revision left transtibial amputation Kerecis micro powder was used in the wound.  Patient has fallen on his leg twice without the limb protector and has recurrent dehiscence of the wound. ? ?Assessment & Plan: ?Visit Diagnoses:  ?1. Dehiscence of amputation stump of left lower extremity ([place])   ? ? ?Plan: Patient will start daily soap and water cleansing dry dressing changes continue with Augmentin 1 a day ? ?Follow-Up Instructions: Return in about 1 week (around 07/09/2021).  ? ?Ortho Exam ? ?Patient is alert, oriented, no adenopathy, well-dressed, normal affect, normal respiratory effort. ?Examination there is slight dehiscence of the wound with a blood clot along the surgical incision his leg has brawny edema swelling there was 50 cc in the wound VAC canister. ? ?Imaging: ?No results found. ?No images are attached to the encounter. ? ?Labs: ?Lab Results  ?Component Value Date  ? HGBA1C 5.5 05/04/2021  ? HGBA1C 6.3 (H) 02/02/2021  ? HGBA1C 5.4 09/13/2020  ? ESRSEDRATE 34 (H) 01/24/2020  ? ESRSEDRATE 41 (H) 01/10/2020  ? ESRSEDRATE 55 (H) 12/27/2019  ? CRP 8.8 (H) 01/24/2020  ? CRP 35.9 (H) 01/10/2020  ? CRP 77.9 (H) 12/27/2019  ? REPTSTATUS 07/02/2021 FINAL 06/27/2021  ? GRAMSTAIN  06/27/2021  ?  RARE WBC PRESENT, PREDOMINANTLY MONONUCLEAR ?NO ORGANISMS SEEN ?  ? CULT  06/27/2021  ?  RARE PSEUDOMONAS AERUGINOSA ?CRITICAL RESULT CALLED TO, READ BACK BY AND VERIFIED WITH: MSG DR.Salisha Bardsley  THROUGH EPIC AT 1319 ON 06/30/2022  BY T.SAAD. ?NO ANAEROBES ISOLATED ?Performed at [facility], [place] [address], [place], [place] [address] ?  ? LABORGA PSEUDOMONAS AERUGINOSA 06/27/2021  ? ? ? ?Lab Results  ?Component Value Date  ? ALBUMIN 3.7 05/23/2021  ? ALBUMIN 3.7 05/21/2021  ? ALBUMIN 3.4 (L) 05/18/2021  ? PREALBUMIN 32.7 05/09/2021  ? PREALBUMIN 14.8 (L) 12/14/2018  ? ? ?Lab Results  ?Component Value Date  ? MG 2.4 05/14/2021  ? MG 1.9 05/09/2021  ? MG 2.0 06/28/2020  ? ?Lab Results  ?Component Value Date  ? VD25OH 16.13 (L) 05/09/2021  ? ? ?Lab Results  ?Component Value Date  ? PREALBUMIN 32.7 05/09/2021  ? PREALBUMIN 14.8 (L) 12/14/2018  ? ? ?  Latest Ref Rng & Units 06/27/2021  ?  8:04 AM 05/23/2021  ? 12:52 PM 05/21/2021  ?  8:49 PM  ?CBC EXTENDED  ?WBC 4.0 - 10.5 K/uL  7.7   7.0    ?RBC 4.22 - 5.81 MIL/uL  3.27   3.34    ?Hemoglobin 13.0 - 17.0 g/dL 9.9   9.6   10.2    ?HCT 39.0 - 52.0 % 29.0   29.8   30.1    ?Platelets 150 - 400 K/uL  168   184    ? ? ? ?There is  no height or weight on file to calculate BMI. ? ?Orders:  ?No orders of the defined types were placed in this encounter. ? ?No orders of the defined types were placed in this encounter. ? ? ? Procedures: ?No procedures performed ? ?Clinical Data: ?No additional findings. ? ?ROS: ? ?All other systems negative, except as noted in the HPI. ?Review of Systems ? ?Objective: ?Vital Signs: There were no vitals taken for this visit. ? ?Specialty Comments:  ?No specialty comments available. ? ?PMFS History: ?Patient Active Problem List  ? Diagnosis Date Noted  ? Dehiscence of amputation stump of left lower extremity (HCC)   ? Left below-knee amputee ([place]) 05/11/2021  ? S/P BKA (below knee amputation) unilateral, left ([place]) 05/09/2021  ? Partial nontraumatic amputation of foot, left ([place]) 02/20/2021  ? Abscess of bursa of left foot 02/03/2021  ? Cutaneous abscess of left foot   ? Acute osteomyelitis of metatarsal bone of left foot (HCC)   ? Symptomatic anemia 09/13/2020  ? COVID-19  virus infection 09/13/2020  ? Pressure injury of skin 06/29/2020  ? Gross hematuria 06/16/2020  ? Typical atrial flutter ([name]) 03/24/2020  ? Bilateral pleural effusion 02/24/2020  ? Healthcare maintenance 01/28/2020  ? Shortness of breath 01/28/2020  ? History of partial ray amputation of fourth toe of right foot ([place]) 09/08/2019  ? Ulcerated, foot, right, with necrosis of bone ([place])   ? Chronic cough 06/25/2019  ? Cutaneous abscess of right foot   ? Subacute osteomyelitis of right foot ([place]) 12/14/2018  ? AKI (acute kidney injury) ([place]) 12/14/2018  ? ESRD (end stage renal disease) ([place]) 12/14/2018  ? Gait abnormality 08/24/2018  ? Diabetic neuropathy ([place]) 02/06/2018  ? Cervical myelopathy ([place]) 02/06/2018  ? Onychomycosis 10/30/2017  ? Other spondylosis with radiculopathy, cervical region 01/27/2017  ? Midfoot ulcer, right, limited to breakdown of skin ([place]) 11/15/2016  ? Lateral epicondylitis, left elbow 08/12/2016  ? Cellulitis of fifth toe of right foot 07/29/2016  ? Prostate cancer ([place]) 06/19/2016  ? Non-pressure chronic ulcer of other part of right foot limited to breakdown of skin ([place]) 04/04/2016  ? Cellulitis of leg, right 12/24/2015  ? Cellulitis of right lower extremity 12/24/2015  ? Gout 09/14/2012  ? HTN (hypertension) 09/14/2012  ? Hypothyroidism 09/14/2012  ? CAD (coronary artery disease) 09/14/2012  ? Diabetes mellitus ([place]) 09/14/2012  ? Hypercholesteremia   ? Neuropathy ([place])   ? ?Past Medical History:  ?Diagnosis Date  ? Ambulates with cane   ? straight cane  ? Anemia   ? Cervical myelopathy ([place]) 02/06/2018  ? Chronic kidney disease   ? 4 M W F- home  ? Complication of anesthesia   ? Coronary artery disease   ? Diabetes mellitus without complication ([place])   ? type2  ? Diabetic foot ulcer ([place])   ? Diabetic neuropathy ([place]) 02/06/2018  ? Gait abnormality 08/24/2018  ? GERD (gastroesophageal reflux disease)   ?  01/06/20- not current  ? Gout   ? History of blood transfusion   ? History of  blood transfusion   ? History of kidney stones   ? passed stones  ? Hypercholesteremia   ? Hypertension   ? Hypothyroidism   ? Neuromuscular disorder ([place])   ? neuropathy left leg and bilateral feet  ? Neuropathy   ? PONV (postoperative nausea and vomiting)   ? Prostate cancer ([place])   ? PVD (peripheral vascular disease) ([place])   ? with amputations  ?  ?Family History  ?Problem Relation  Age of Onset  ? Diabetes Mellitus II Mother   ? Kidney disease Mother   ? Diabetes Mellitus II Father   ? CAD Father   ? Cancer Father   ?     prostate  ? Kidney disease Father   ? Diabetes Mellitus II Brother   ? Kidney disease Brother   ? Diabetes Mellitus II Brother   ? Stomach cancer Brother 24  ? Kidney disease Brother   ? Colon cancer Neg Hx   ? Colon polyps Neg Hx   ? Esophageal cancer Neg Hx   ? Rectal cancer Neg Hx   ? Pancreatic cancer Neg Hx   ?  ?Past Surgical History:  ?Procedure Laterality Date  ? A-FLUTTER ABLATION N/A 04/06/2020  ? Procedure: A-FLUTTER ABLATION;  Surgeon: [name], MD;  Location: [facility];  Service: Cardiovascular;  Laterality: N/A;  ? AMPUTATION Left 12/25/2013  ? Procedure: AMPUTATION RAY LEFT 5TH RAY;  Surgeon: [name], MD;  Location: [facility];  Service: Orthopedics;  Laterality: Left;  ? AMPUTATION Right 12/15/2018  ? Procedure: AMPUTATION OF 4TH AND 5TH TOES RIGHT FOOT;  Surgeon: [name], MD;  Location: [facility];  Service: Orthopedics;  Laterality: Right;  ? AMPUTATION Left 02/02/2021  ? Procedure: LEFT FOOT 4TH RAY AMPUTATION;  Surgeon: [name], MD;  Location: [facility];  Service: Orthopedics;  Laterality: Left;  ? AMPUTATION Left 05/09/2021  ? Procedure: LEFT BELOW KNEE AMPUTATION;  Surgeon: [name], MD;  Location: [facility];  Service: Orthopedics;  Laterality: Left;  ? APPLICATION OF WOUND VAC Right 12/15/2018  ? Procedure: APPLICATION OF WOUND VAC;  Surgeon: [name], MD;  Location: [facility];  Service: Orthopedics;  Laterality: Right;  ? AV FISTULA PLACEMENT Left  03/25/2019  ? Procedure: LEFT ARM ARTERIOVENOUS (AV) FISTULA CREATION;  Surgeon: [name], MD;  Location: [facility];  Service: Vascular;  Laterality: Left;  ? BACK SURGERY    ? [place]

## 2021-07-04 NOTE — Telephone Encounter (Signed)
LMTCB

## 2021-07-05 ENCOUNTER — Telehealth: Payer: Self-pay | Admitting: Orthopedic Surgery

## 2021-07-05 ENCOUNTER — Other Ambulatory Visit: Payer: Self-pay | Admitting: Orthopedic Surgery

## 2021-07-05 ENCOUNTER — Encounter: Payer: Medicare Other | Admitting: Orthopedic Surgery

## 2021-07-05 MED ORDER — METHOCARBAMOL 500 MG PO TABS: 500.0000 mg | ORAL_TABLET | Freq: Three times a day (TID) | ORAL | 0 refills | Status: DC

## 2021-07-05 NOTE — Telephone Encounter (Signed)
Message has already been sent to Dr. [name] for this matter. He called a little bit ago. ?

## 2021-07-05 NOTE — Telephone Encounter (Signed)
Please advise 

## 2021-07-05 NOTE — Telephone Encounter (Signed)
Pt informed

## 2021-07-05 NOTE — Telephone Encounter (Signed)
Patient called advised he is having spasms in his left stump and would like to get a muscle relaxer sent to his pharmacy.  The number to contact  patient is [phone] ?

## 2021-07-05 NOTE — Telephone Encounter (Signed)
Pt would like to know if he could get a muscle relaxer for his pain.  ? ? ?[phone] [id number] ?

## 2021-07-05 NOTE — Telephone Encounter (Signed)
Juliann Pulse from [facility] called needing orders for wound care. She would like to orders to put adaptic over patient's incision before placing the guaze?  Also, they can see patient 3 times this week and teach patient's wife to do the dressings.  Kathy's call back # is [phone]. ?

## 2021-07-05 NOTE — Telephone Encounter (Signed)
Please see below and advise.

## 2021-07-05 NOTE — Telephone Encounter (Signed)
I called and sw Juliann Pulse and advised verbal ok for orders a s requested below. Will hold this message in my box as the pt has an appt on Monday and they will need updated orders after that visit. Advised I will call Juliann Pulse on Monday.  ?

## 2021-07-06 ENCOUNTER — Other Ambulatory Visit: Payer: Self-pay | Admitting: Orthopedic Surgery

## 2021-07-06 ENCOUNTER — Telehealth: Payer: Self-pay | Admitting: Orthopedic Surgery

## 2021-07-06 MED ORDER — METHOCARBAMOL 500 MG PO TABS: 500.0000 mg | ORAL_TABLET | Freq: Three times a day (TID) | ORAL | 0 refills | Status: DC

## 2021-07-06 MED ORDER — METHOCARBAMOL 500 MG PO TABS
500.0000 mg | ORAL_TABLET | Freq: Three times a day (TID) | ORAL | 0 refills | Status: DC
Start: 2021-07-06 — End: 2021-07-06

## 2021-07-06 NOTE — Telephone Encounter (Signed)
Spoke with [name] at [facility]. Rx was ready by 3:44pm today. Pt notified. ?

## 2021-07-06 NOTE — Telephone Encounter (Signed)
Please send in methocarbamol  pt [facility] not at [facility] ?

## 2021-07-06 NOTE — Telephone Encounter (Signed)
Patient called. Says the pharmacy does not have the RX for his muscle relaxer. His call back number is [phone] ?

## 2021-07-06 NOTE — Telephone Encounter (Signed)
I've [facility] to [facility] There seems to be an issue with [facility]. Will try again. ?

## 2021-07-06 NOTE — Telephone Encounter (Signed)
[facility]

## 2021-07-09 ENCOUNTER — Ambulatory Visit (INDEPENDENT_AMBULATORY_CARE_PROVIDER_SITE_OTHER): Payer: Medicare Other | Admitting: Orthopedic Surgery

## 2021-07-09 DIAGNOSIS — T8781 Dehiscence of amputation stump: Secondary | ICD-10-CM

## 2021-07-09 NOTE — Telephone Encounter (Signed)
I called and sw Juliann Pulse to advise that the pt's limb was looking better per Dr. [name] and ok to continue with the nonadherent gauze and ace bandage change daily. The [facility] will see pt twice a week and the pt's wife will take the other days and she will call if there are any changes to the incision or any other complications.  ?

## 2021-07-10 ENCOUNTER — Encounter: Payer: Self-pay | Admitting: Orthopedic Surgery

## 2021-07-10 NOTE — Progress Notes (Signed)
? ?Office Visit Note ?  ?Patient: [name]           ?Date of Birth: 10-26-1950           ?MRN: [id number] ?Visit Date: 07/09/2021 ?             ?Requested by: Lorenda Hatchet, [place] ?[phone] ?[address] ?[place],  [place] [address] ?PCP: Lorenda Hatchet, FNP ? ?Chief Complaint  ?Patient presents with  ? Left Leg - Routine Post Op  ?  06/27/21 revision left BKA with Kerecis micro powder   ? ? ? ? ?HPI: ?Patient is a 71 year old gentleman who is 2 weeks status post revision left below-knee amputation with use of Kerecis micro powder.  Patient is currently on Cipro.  Patient states he has decreased swelling and a spot of bloody drainage. ? ?Assessment & Plan: ?Visit Diagnoses:  ?1. Dehiscence of amputation stump of left lower extremity ([place])   ? ? ?Plan: Continue Dial soap cleansing dry dressing changes continue to float the leg and popliteal fossa off the bed to unload pressure. ? ?Follow-Up Instructions: Return in about 1 week (around 07/16/2021).  ? ?Ortho Exam ? ?Patient is alert, oriented, no adenopathy, well-dressed, normal affect, normal respiratory effort. ?Examination the ischemic blisters posteriorly are healing these are superficial.  The incision is well approximated there is small gaping of the incision. ? ?Imaging: ?No results found. ?No images are attached to the encounter. ? ?Labs: ?Lab Results  ?Component Value Date  ? HGBA1C 5.5 05/04/2021  ? HGBA1C 6.3 (H) 02/02/2021  ? HGBA1C 5.4 09/13/2020  ? ESRSEDRATE 34 (H) 01/24/2020  ? ESRSEDRATE 41 (H) 01/10/2020  ? ESRSEDRATE 55 (H) 12/27/2019  ? CRP 8.8 (H) 01/24/2020  ? CRP 35.9 (H) 01/10/2020  ? CRP 77.9 (H) 12/27/2019  ? REPTSTATUS 07/02/2021 FINAL 06/27/2021  ? GRAMSTAIN  06/27/2021  ?  RARE WBC PRESENT, PREDOMINANTLY MONONUCLEAR ?NO ORGANISMS SEEN ?  ? CULT  06/27/2021  ?  RARE PSEUDOMONAS AERUGINOSA ?CRITICAL RESULT CALLED TO, READ BACK BY AND VERIFIED WITH: MSG DR.Joncarlo Friberg  THROUGH EPIC AT 1319 ON 06/30/2022 BY T.SAAD. ?NO ANAEROBES  ISOLATED ?Performed at [facility], [place] [address] [place], [place] [address] ?  ? LABORGA PSEUDOMONAS AERUGINOSA 06/27/2021  ? ? ? ?Lab Results  ?Component Value Date  ? ALBUMIN 3.7 05/23/2021  ? ALBUMIN 3.7 05/21/2021  ? ALBUMIN 3.4 (L) 05/18/2021  ? PREALBUMIN 32.7 05/09/2021  ? PREALBUMIN 14.8 (L) 12/14/2018  ? ? ?Lab Results  ?Component Value Date  ? MG 2.4 05/14/2021  ? MG 1.9 05/09/2021  ? MG 2.0 06/28/2020  ? ?Lab Results  ?Component Value Date  ? VD25OH 16.13 (L) 05/09/2021  ? ? ?Lab Results  ?Component Value Date  ? PREALBUMIN 32.7 05/09/2021  ? PREALBUMIN 14.8 (L) 12/14/2018  ? ? ?  Latest Ref Rng & Units 06/27/2021  ?  8:04 AM 05/23/2021  ? 12:52 PM 05/21/2021  ?  8:49 PM  ?CBC EXTENDED  ?WBC 4.0 - 10.5 K/uL  7.7   7.0    ?RBC 4.22 - 5.81 MIL/uL  3.27   3.34    ?Hemoglobin 13.0 - 17.0 g/dL 9.9   9.6   10.2    ?HCT 39.0 - 52.0 % 29.0   29.8   30.1    ?Platelets 150 - 400 K/uL  168   184    ? ? ? ?There is no height or weight on file to calculate BMI. ? ?  Orders:  ?No orders of the defined types were placed in this encounter. ? ?No orders of the defined types were placed in this encounter. ? ? ? Procedures: ?No procedures performed ? ?Clinical Data: ?No additional findings. ? ?ROS: ? ?All other systems negative, except as noted in the HPI. ?Review of Systems ? ?Objective: ?Vital Signs: There were no vitals taken for this visit. ? ?Specialty Comments:  ?No specialty comments available. ? ?PMFS History: ?Patient Active Problem List  ? Diagnosis Date Noted  ? Dehiscence of amputation stump of left lower extremity (HCC)   ? Left below-knee amputee ([place]) 05/11/2021  ? S/P BKA (below knee amputation) unilateral, left ([name]) 05/09/2021  ? Partial nontraumatic amputation of foot, left ([place]) 02/20/2021  ? Abscess of bursa of left foot 02/03/2021  ? Cutaneous abscess of left foot   ? Acute osteomyelitis of metatarsal bone of left foot (HCC)   ? Symptomatic anemia 09/13/2020  ? COVID-19 virus infection 09/13/2020   ? Pressure injury of skin 06/29/2020  ? Gross hematuria 06/16/2020  ? Typical atrial flutter ([place]) 03/24/2020  ? Bilateral pleural effusion 02/24/2020  ? Healthcare maintenance 01/28/2020  ? Shortness of breath 01/28/2020  ? History of partial ray amputation of fourth toe of right foot ([place]) 09/08/2019  ? Ulcerated, foot, right, with necrosis of bone ([place])   ? Chronic cough 06/25/2019  ? Cutaneous abscess of right foot   ? Subacute osteomyelitis of right foot ([place]) 12/14/2018  ? AKI (acute kidney injury) ([place]) 12/14/2018  ? ESRD (end stage renal disease) ([place]) 12/14/2018  ? Gait abnormality 08/24/2018  ? Diabetic neuropathy ([place]) 02/06/2018  ? Cervical myelopathy ([place]) 02/06/2018  ? Onychomycosis 10/30/2017  ? Other spondylosis with radiculopathy, cervical region 01/27/2017  ? Midfoot ulcer, right, limited to breakdown of skin ([place]) 11/15/2016  ? Lateral epicondylitis, left elbow 08/12/2016  ? Cellulitis of fifth toe of right foot 07/29/2016  ? Prostate cancer ([place]) 06/19/2016  ? Non-pressure chronic ulcer of other part of right foot limited to breakdown of skin ([place]) 04/04/2016  ? Cellulitis of leg, right 12/24/2015  ? Cellulitis of right lower extremity 12/24/2015  ? Gout 09/14/2012  ? HTN (hypertension) 09/14/2012  ? Hypothyroidism 09/14/2012  ? CAD (coronary artery disease) 09/14/2012  ? Diabetes mellitus ([name]) 09/14/2012  ? Hypercholesteremia   ? Neuropathy ([place])   ? ?Past Medical History:  ?Diagnosis Date  ? Ambulates with cane   ? straight cane  ? Anemia   ? Cervical myelopathy ([place]) 02/06/2018  ? Chronic kidney disease   ? 62 M W F- home  ? Complication of anesthesia   ? Coronary artery disease   ? Diabetes mellitus without complication ([place])   ? type2  ? Diabetic foot ulcer ([place])   ? Diabetic neuropathy ([place]) 02/06/2018  ? Gait abnormality 08/24/2018  ? GERD (gastroesophageal reflux disease)   ?  01/06/20- not current  ? Gout   ? History of blood transfusion   ? History of blood transfusion   ?  History of kidney stones   ? passed stones  ? Hypercholesteremia   ? Hypertension   ? Hypothyroidism   ? Neuromuscular disorder ([place])   ? neuropathy left leg and bilateral feet  ? Neuropathy   ? PONV (postoperative nausea and vomiting)   ? Prostate cancer ([place])   ? PVD (peripheral vascular disease) ([place])   ? with amputations  ?  ?Family History  ?Problem Relation Age of Onset  ? Diabetes Mellitus II Mother   ?  Kidney disease Mother   ? Diabetes Mellitus II Father   ? CAD Father   ? Cancer Father   ?     prostate  ? Kidney disease Father   ? Diabetes Mellitus II Brother   ? Kidney disease Brother   ? Diabetes Mellitus II Brother   ? Stomach cancer Brother 48  ? Kidney disease Brother   ? Colon cancer Neg Hx   ? Colon polyps Neg Hx   ? Esophageal cancer Neg Hx   ? Rectal cancer Neg Hx   ? Pancreatic cancer Neg Hx   ?  ?Past Surgical History:  ?Procedure Laterality Date  ? A-FLUTTER ABLATION N/A 04/06/2020  ? Procedure: A-FLUTTER ABLATION;  Surgeon: [name], MD;  Location: [facility];  Service: Cardiovascular;  Laterality: N/A;  ? AMPUTATION Left 12/25/2013  ? Procedure: AMPUTATION RAY LEFT 5TH RAY;  Surgeon: [name], MD;  Location: [facility];  Service: Orthopedics;  Laterality: Left;  ? AMPUTATION Right 12/15/2018  ? Procedure: AMPUTATION OF 4TH AND 5TH TOES RIGHT FOOT;  Surgeon: [name], MD;  Location: [facility];  Service: Orthopedics;  Laterality: Right;  ? AMPUTATION Left 02/02/2021  ? Procedure: LEFT FOOT 4TH RAY AMPUTATION;  Surgeon: [name], MD;  Location: [facility];  Service: Orthopedics;  Laterality: Left;  ? AMPUTATION Left 05/09/2021  ? Procedure: LEFT BELOW KNEE AMPUTATION;  Surgeon: [name], MD;  Location: [facility];  Service: Orthopedics;  Laterality: Left;  ? APPLICATION OF WOUND VAC Right 12/15/2018  ? Procedure: APPLICATION OF WOUND VAC;  Surgeon: [name], MD;  Location: [facility];  Service: Orthopedics;  Laterality: Right;  ? AV FISTULA PLACEMENT Left 03/25/2019  ? Procedure:  LEFT ARM ARTERIOVENOUS (AV) FISTULA CREATION;  Surgeon: [name], MD;  Location: [facility];  Service: Vascular;  Laterality: Left;  ? BACK SURGERY    ? BASCILIC VEIN TRANSPOSITION Left 05/20/2019  ? Procedure: SECON

## 2021-07-16 ENCOUNTER — Ambulatory Visit (INDEPENDENT_AMBULATORY_CARE_PROVIDER_SITE_OTHER): Payer: Medicare Other | Admitting: Orthopedic Surgery

## 2021-07-16 DIAGNOSIS — T8781 Dehiscence of amputation stump: Secondary | ICD-10-CM

## 2021-07-16 DIAGNOSIS — Z89512 Acquired absence of left leg below knee: Secondary | ICD-10-CM

## 2021-07-16 DIAGNOSIS — S88112A Complete traumatic amputation at level between knee and ankle, left lower leg, initial encounter: Secondary | ICD-10-CM

## 2021-07-17 ENCOUNTER — Encounter: Payer: Self-pay | Admitting: Orthopedic Surgery

## 2021-07-17 NOTE — Progress Notes (Signed)
? ?Office Visit Note ?  ?Patient: [name]           ?Date of Birth: 09-17-1950           ?MRN: [id number] ?Visit Date: 07/16/2021 ?             ?Requested by: Lorenda Hatchet, [place] ?[phone] ?[address] ?[place],  [place] [address] ?PCP: Lorenda Hatchet, FNP ? ?Chief Complaint  ?Patient presents with  ? Left Leg - Routine Post Op  ?  06/27/21 revision left BKA   ? ? ? ? ?HPI: ?Patient is a 71 year old gentleman who presents 3 weeks status post revision left transtibial amputation with application of Kerecis micro powder.  Patient also had ulcers in the popliteal fossa. ? ?Assessment & Plan: ?Visit Diagnoses:  ?1. Dehiscence of amputation stump of left lower extremity ([place])   ?2. Below-knee amputation of left lower extremity ([place])   ? ? ?Plan: Resume using the stump shrinker washing with soap and water wearing the shrinker around-the-clock. ? ?Follow-Up Instructions: Return in about 1 week (around 07/23/2021).  ? ?Ortho Exam ? ?Patient is alert, oriented, no adenopathy, well-dressed, normal affect, normal respiratory effort. ?Examination the incision is well approximated there is no cellulitis no drainage there is a thin eschar along the surgical incision but no breakdown.  The ulcers in the popliteal fossa have healed. ? ?Imaging: ?No results found. ?No images are attached to the encounter. ? ?Labs: ?Lab Results  ?Component Value Date  ? HGBA1C 5.5 05/04/2021  ? HGBA1C 6.3 (H) 02/02/2021  ? HGBA1C 5.4 09/13/2020  ? ESRSEDRATE 34 (H) 01/24/2020  ? ESRSEDRATE 41 (H) 01/10/2020  ? ESRSEDRATE 55 (H) 12/27/2019  ? CRP 8.8 (H) 01/24/2020  ? CRP 35.9 (H) 01/10/2020  ? CRP 77.9 (H) 12/27/2019  ? REPTSTATUS 07/02/2021 FINAL 06/27/2021  ? GRAMSTAIN  06/27/2021  ?  RARE WBC PRESENT, PREDOMINANTLY MONONUCLEAR ?NO ORGANISMS SEEN ?  ? CULT  06/27/2021  ?  RARE PSEUDOMONAS AERUGINOSA ?CRITICAL RESULT CALLED TO, READ BACK BY AND VERIFIED WITH: MSG DR.Jessamine Barcia  THROUGH EPIC AT 1319 ON 06/30/2022 BY T.SAAD. ?NO ANAEROBES  ISOLATED ?Performed at [facility], [place] [address], [place], [place] [address] ?  ? LABORGA PSEUDOMONAS AERUGINOSA 06/27/2021  ? ? ? ?Lab Results  ?Component Value Date  ? ALBUMIN 3.7 05/23/2021  ? ALBUMIN 3.7 05/21/2021  ? ALBUMIN 3.4 (L) 05/18/2021  ? PREALBUMIN 32.7 05/09/2021  ? PREALBUMIN 14.8 (L) 12/14/2018  ? ? ?Lab Results  ?Component Value Date  ? MG 2.4 05/14/2021  ? MG 1.9 05/09/2021  ? MG 2.0 06/28/2020  ? ?Lab Results  ?Component Value Date  ? VD25OH 16.13 (L) 05/09/2021  ? ? ?Lab Results  ?Component Value Date  ? PREALBUMIN 32.7 05/09/2021  ? PREALBUMIN 14.8 (L) 12/14/2018  ? ? ?  Latest Ref Rng & Units 06/27/2021  ?  8:04 AM 05/23/2021  ? 12:52 PM 05/21/2021  ?  8:49 PM  ?CBC EXTENDED  ?WBC 4.0 - 10.5 K/uL  7.7   7.0    ?RBC 4.22 - 5.81 MIL/uL  3.27   3.34    ?Hemoglobin 13.0 - 17.0 g/dL 9.9   9.6   10.2    ?HCT 39.0 - 52.0 % 29.0   29.8   30.1    ?Platelets 150 - 400 K/uL  168   184    ? ? ? ?There is no height or weight on file to calculate BMI. ? ?Orders:  ?  No orders of the defined types were placed in this encounter. ? ?No orders of the defined types were placed in this encounter. ? ? ? Procedures: ?No procedures performed ? ?Clinical Data: ?No additional findings. ? ?ROS: ? ?All other systems negative, except as noted in the HPI. ?Review of Systems ? ?Objective: ?Vital Signs: There were no vitals taken for this visit. ? ?Specialty Comments:  ?No specialty comments available. ? ?PMFS History: ?Patient Active Problem List  ? Diagnosis Date Noted  ? Dehiscence of amputation stump of left lower extremity (HCC)   ? Left below-knee amputee ([place]) 05/11/2021  ? S/P BKA (below knee amputation) unilateral, left ([place]) 05/09/2021  ? Partial nontraumatic amputation of foot, left ([place]) 02/20/2021  ? Abscess of bursa of left foot 02/03/2021  ? Cutaneous abscess of left foot   ? Acute osteomyelitis of metatarsal bone of left foot (HCC)   ? Symptomatic anemia 09/13/2020  ? COVID-19 virus infection 09/13/2020   ? Pressure injury of skin 06/29/2020  ? Gross hematuria 06/16/2020  ? Typical atrial flutter ([place]) 03/24/2020  ? Bilateral pleural effusion 02/24/2020  ? Healthcare maintenance 01/28/2020  ? Shortness of breath 01/28/2020  ? History of partial ray amputation of fourth toe of right foot ([place]) 09/08/2019  ? Ulcerated, foot, right, with necrosis of bone ([place])   ? Chronic cough 06/25/2019  ? Cutaneous abscess of right foot   ? Subacute osteomyelitis of right foot ([place]) 12/14/2018  ? AKI (acute kidney injury) ([place]) 12/14/2018  ? ESRD (end stage renal disease) ([place]) 12/14/2018  ? Gait abnormality 08/24/2018  ? Diabetic neuropathy ([place]) 02/06/2018  ? Cervical myelopathy ([place]) 02/06/2018  ? Onychomycosis 10/30/2017  ? Other spondylosis with radiculopathy, cervical region 01/27/2017  ? Midfoot ulcer, right, limited to breakdown of skin ([place]) 11/15/2016  ? Lateral epicondylitis, left elbow 08/12/2016  ? Cellulitis of fifth toe of right foot 07/29/2016  ? Prostate cancer ([place]) 06/19/2016  ? Non-pressure chronic ulcer of other part of right foot limited to breakdown of skin ([place]) 04/04/2016  ? Cellulitis of leg, right 12/24/2015  ? Cellulitis of right lower extremity 12/24/2015  ? Gout 09/14/2012  ? HTN (hypertension) 09/14/2012  ? Hypothyroidism 09/14/2012  ? CAD (coronary artery disease) 09/14/2012  ? Diabetes mellitus ([place]) 09/14/2012  ? Hypercholesteremia   ? Neuropathy ([place])   ? ?Past Medical History:  ?Diagnosis Date  ? Ambulates with cane   ? straight cane  ? Anemia   ? Cervical myelopathy ([place]) 02/06/2018  ? Chronic kidney disease   ? 9 M W F- home  ? Complication of anesthesia   ? Coronary artery disease   ? Diabetes mellitus without complication ([place])   ? type2  ? Diabetic foot ulcer ([name])   ? Diabetic neuropathy ([place]) 02/06/2018  ? Gait abnormality 08/24/2018  ? GERD (gastroesophageal reflux disease)   ?  01/06/20- not current  ? Gout   ? History of blood transfusion   ? History of blood transfusion   ?  History of kidney stones   ? passed stones  ? Hypercholesteremia   ? Hypertension   ? Hypothyroidism   ? Neuromuscular disorder ([place])   ? neuropathy left leg and bilateral feet  ? Neuropathy   ? PONV (postoperative nausea and vomiting)   ? Prostate cancer ([place])   ? PVD (peripheral vascular disease) ([place])   ? with amputations  ?  ?Family History  ?Problem Relation Age of Onset  ? Diabetes Mellitus II Mother   ?  Kidney disease Mother   ? Diabetes Mellitus II Father   ? CAD Father   ? Cancer Father   ?     prostate  ? Kidney disease Father   ? Diabetes Mellitus II Brother   ? Kidney disease Brother   ? Diabetes Mellitus II Brother   ? Stomach cancer Brother 69  ? Kidney disease Brother   ? Colon cancer Neg Hx   ? Colon polyps Neg Hx   ? Esophageal cancer Neg Hx   ? Rectal cancer Neg Hx   ? Pancreatic cancer Neg Hx   ?  ?Past Surgical History:  ?Procedure Laterality Date  ? A-FLUTTER ABLATION N/A 04/06/2020  ? Procedure: A-FLUTTER ABLATION;  Surgeon: [name], MD;  Location: [facility];  Service: Cardiovascular;  Laterality: N/A;  ? AMPUTATION Left 12/25/2013  ? Procedure: AMPUTATION RAY LEFT 5TH RAY;  Surgeon: [name], MD;  [facility];  Service: Orthopedics;  Laterality: Left;  ? AMPUTATION Right 12/15/2018  ? Procedure: AMPUTATION OF 4TH AND 5TH TOES RIGHT FOOT;  Surgeon: [name], MD;  Location: [facility];  Service: Orthopedics;  Laterality: Right;  ? AMPUTATION Left 02/02/2021  ? Procedure: LEFT FOOT 4TH RAY AMPUTATION;  Surgeon: [name], MD;  Location: [facility];  Service: Orthopedics;  Laterality: Left;  ? AMPUTATION Left 05/09/2021  ? Procedure: LEFT BELOW KNEE AMPUTATION;  Surgeon: [name], MD;  Location: [facility];  Service: Orthopedics;  Laterality: Left;  ? APPLICATION OF WOUND VAC Right 12/15/2018  ? Procedure: APPLICATION OF WOUND VAC;  Surgeon: [name], MD;  Location: [facility];  Service: Orthopedics;  Laterality: Right;  ? AV FISTULA PLACEMENT Left 03/25/2019  ? Procedure:  LEFT ARM ARTERIOVENOUS (AV) FISTULA CREATION;  Surgeon: [name], MD;  Location: [facility];  Service: Vascular;  Laterality: Left;  ? BACK SURGERY    ? BASCILIC VEIN TRANSPOSITION Left 05/20/2019  ? Procedure:

## 2021-07-23 ENCOUNTER — Ambulatory Visit (INDEPENDENT_AMBULATORY_CARE_PROVIDER_SITE_OTHER): Payer: Medicare Other | Admitting: Orthopedic Surgery

## 2021-07-23 ENCOUNTER — Other Ambulatory Visit: Payer: Self-pay

## 2021-07-23 DIAGNOSIS — T8781 Dehiscence of amputation stump: Secondary | ICD-10-CM

## 2021-07-23 DIAGNOSIS — S88112A Complete traumatic amputation at level between knee and ankle, left lower leg, initial encounter: Secondary | ICD-10-CM

## 2021-07-23 DIAGNOSIS — Z89512 Acquired absence of left leg below knee: Secondary | ICD-10-CM

## 2021-07-23 MED ORDER — METHOCARBAMOL 500 MG PO TABS: 500.0000 mg | ORAL_TABLET | Freq: Three times a day (TID) | ORAL | 0 refills | Status: DC

## 2021-07-31 ENCOUNTER — Telehealth: Payer: Self-pay | Admitting: Orthopedic Surgery

## 2021-07-31 NOTE — Telephone Encounter (Signed)
Pt wife called and wanted to change pt appt time. [name] did not have a different time on Monday 05/01. She would like [facility] to call her  ? ?CB [phone]  ?

## 2021-07-31 NOTE — Telephone Encounter (Signed)
Please call. Unfortunately I do not have any wiggle room with the schedule. If they need to reschedule to another day please let me know and I will try to open a time.  ?

## 2021-08-01 ENCOUNTER — Other Ambulatory Visit (HOSPITAL_COMMUNITY): Payer: Self-pay

## 2021-08-05 ENCOUNTER — Encounter: Payer: Self-pay | Admitting: Orthopedic Surgery

## 2021-08-05 NOTE — Progress Notes (Signed)
? ?Office Visit Note ?  ?Patient: [name]           ?Date of Birth: 1950/08/01           ?MRN: [id number] ?Visit Date: 07/23/2021 ?             ?Requested by: Lorenda Hatchet, [place] [phone] ?[address] ?[place],  [place] [address] ?PCP: Lorenda Hatchet, FNP ? ?Chief Complaint  ?Patient presents with  ? Left Leg - Routine Post Op  ?  06/27/21 revision left BKA  ? ? ? ? ?HPI: ?Patient is 4 weeks status post revision left transtibial amputation he currently is wearing a stump shrinker. ? ?Assessment & Plan: ?Visit Diagnoses:  ?1. Below-knee amputation of left lower extremity ([place])   ?2. Dehiscence of amputation stump of left lower extremity (HCC)   ? ? ?Plan: Recommended decreasing the shrinker size reevaluate in 3 weeks. ? ?Follow-Up Instructions: Return in about 2 weeks (around 08/06/2021).  ? ?Ortho Exam ? ?Patient is alert, oriented, no adenopathy, well-dressed, normal affect, normal respiratory effort. ?Examination the incision shows interval healing there is some dry callused skin dry eschar there is no cellulitis no odor or drainage. ? ?Imaging: ?No results found. ? ? ?Labs: ?Lab Results  ?Component Value Date  ? HGBA1C 5.5 05/04/2021  ? HGBA1C 6.3 (H) 02/02/2021  ? HGBA1C 5.4 09/13/2020  ? ESRSEDRATE 34 (H) 01/24/2020  ? ESRSEDRATE 41 (H) 01/10/2020  ? ESRSEDRATE 55 (H) 12/27/2019  ? CRP 8.8 (H) 01/24/2020  ? CRP 35.9 (H) 01/10/2020  ? CRP 77.9 (H) 12/27/2019  ? REPTSTATUS 07/02/2021 FINAL 06/27/2021  ? GRAMSTAIN  06/27/2021  ?  RARE WBC PRESENT, PREDOMINANTLY MONONUCLEAR ?NO ORGANISMS SEEN ?  ? CULT  06/27/2021  ?  RARE PSEUDOMONAS AERUGINOSA ?CRITICAL RESULT CALLED TO, READ BACK BY AND VERIFIED WITH: MSG DR.Cameren Earnest  THROUGH EPIC AT 1319 ON 06/30/2022 BY T.SAAD. ?NO ANAEROBES ISOLATED ?Performed at [facility], [place] [address] [place], [place] [address] ?  ? LABORGA PSEUDOMONAS AERUGINOSA 06/27/2021  ? ? ? ?Lab Results  ?Component Value Date  ? ALBUMIN 3.7 05/23/2021  ? ALBUMIN 3.7 05/21/2021  ?  ALBUMIN 3.4 (L) 05/18/2021  ? PREALBUMIN 32.7 05/09/2021  ? PREALBUMIN 14.8 (L) 12/14/2018  ? ? ?Lab Results  ?Component Value Date  ? MG 2.4 05/14/2021  ? MG 1.9 05/09/2021  ? MG 2.0 06/28/2020  ? ?Lab Results  ?Component Value Date  ? VD25OH 16.13 (L) 05/09/2021  ? ? ?Lab Results  ?Component Value Date  ? PREALBUMIN 32.7 05/09/2021  ? PREALBUMIN 14.8 (L) 12/14/2018  ? ? ?  Latest Ref Rng & Units 06/27/2021  ?  8:04 AM 05/23/2021  ? 12:52 PM 05/21/2021  ?  8:49 PM  ?CBC EXTENDED  ?WBC 4.0 - 10.5 K/uL  7.7   7.0    ?RBC 4.22 - 5.81 MIL/uL  3.27   3.34    ?Hemoglobin 13.0 - 17.0 g/dL 9.9   9.6   10.2    ?HCT 39.0 - 52.0 % 29.0   29.8   30.1    ?Platelets 150 - 400 K/uL  168   184    ? ? ? ?There is no height or weight on file to calculate BMI. ? ?Orders:  ?No orders of the defined types were placed in this encounter. ? ?No orders of the defined types were placed in this encounter. ? ? ? Procedures: ?No procedures performed ? ?Clinical Data: ?No additional  findings. ? ?ROS: ? ?All other systems negative, except as noted in the HPI. ?Review of Systems ? ?Objective: ?Vital Signs: There were no vitals taken for this visit. ? ?Specialty Comments:  ?No specialty comments available. ? ?PMFS History: ?Patient Active Problem List  ? Diagnosis Date Noted  ? Dehiscence of amputation stump of left lower extremity (HCC)   ? Left below-knee amputee ([place]) 05/11/2021  ? S/P BKA (below knee amputation) unilateral, left ([place]) 05/09/2021  ? Partial nontraumatic amputation of foot, left ([place]) 02/20/2021  ? Abscess of bursa of left foot 02/03/2021  ? Cutaneous abscess of left foot   ? Acute osteomyelitis of metatarsal bone of left foot (HCC)   ? Symptomatic anemia 09/13/2020  ? COVID-19 virus infection 09/13/2020  ? Pressure injury of skin 06/29/2020  ? Gross hematuria 06/16/2020  ? Typical atrial flutter ([place]) 03/24/2020  ? Bilateral pleural effusion 02/24/2020  ? Healthcare maintenance 01/28/2020  ? Shortness of breath 01/28/2020  ?  History of partial ray amputation of fourth toe of right foot ([place]) 09/08/2019  ? Ulcerated, foot, right, with necrosis of bone ([place])   ? Chronic cough 06/25/2019  ? Cutaneous abscess of right foot   ? Subacute osteomyelitis of right foot ([place]) 12/14/2018  ? AKI (acute kidney injury) ([place]) 12/14/2018  ? ESRD (end stage renal disease) ([place]) 12/14/2018  ? Gait abnormality 08/24/2018  ? Diabetic neuropathy ([place]) 02/06/2018  ? Cervical myelopathy ([place]) 02/06/2018  ? Onychomycosis 10/30/2017  ? Other spondylosis with radiculopathy, cervical region 01/27/2017  ? Midfoot ulcer, right, limited to breakdown of skin ([place]) 11/15/2016  ? Lateral epicondylitis, left elbow 08/12/2016  ? Cellulitis of fifth toe of right foot 07/29/2016  ? Prostate cancer ([place]) 06/19/2016  ? Non-pressure chronic ulcer of other part of right foot limited to breakdown of skin ([place]) 04/04/2016  ? Cellulitis of leg, right 12/24/2015  ? Cellulitis of right lower extremity 12/24/2015  ? Gout 09/14/2012  ? HTN (hypertension) 09/14/2012  ? Hypothyroidism 09/14/2012  ? CAD (coronary artery disease) 09/14/2012  ? Diabetes mellitus ([place]) 09/14/2012  ? Hypercholesteremia   ? Neuropathy ([place])   ? ?Past Medical History:  ?Diagnosis Date  ? Ambulates with cane   ? straight cane  ? Anemia   ? Cervical myelopathy ([place]) 02/06/2018  ? Chronic kidney disease   ? 51 M W F- home  ? Complication of anesthesia   ? Coronary artery disease   ? Diabetes mellitus without complication ([place])   ? type2  ? Diabetic foot ulcer ([place])   ? Diabetic neuropathy ([place]) 02/06/2018  ? Gait abnormality 08/24/2018  ? GERD (gastroesophageal reflux disease)   ?  01/06/20- not current  ? Gout   ? History of blood transfusion   ? History of blood transfusion   ? History of kidney stones   ? passed stones  ? Hypercholesteremia   ? Hypertension   ? Hypothyroidism   ? Neuromuscular disorder ([place])   ? neuropathy left leg and bilateral feet  ? Neuropathy   ? PONV (postoperative nausea and  vomiting)   ? Prostate cancer ([place])   ? PVD (peripheral vascular disease) ([place])   ? with amputations  ?  ?Family History  ?Problem Relation Age of Onset  ? Diabetes Mellitus II Mother   ? Kidney disease Mother   ? Diabetes Mellitus II Father   ? CAD Father   ? Cancer Father   ?     prostate  ? Kidney disease Father   ?  Diabetes Mellitus II Brother   ? Kidney disease Brother   ? Diabetes Mellitus II Brother   ? Stomach cancer Brother 90  ? Kidney disease Brother   ? Colon cancer Neg Hx   ? Colon polyps Neg Hx   ? Esophageal cancer Neg Hx   ? Rectal cancer Neg Hx   ? Pancreatic cancer Neg Hx   ?  ?Past Surgical History:  ?Procedure Laterality Date  ? A-FLUTTER ABLATION N/A 04/06/2020  ? Procedure: A-FLUTTER ABLATION;  Surgeon: [name], MD;  Location: [facility];  Service: Cardiovascular;  Laterality: N/A;  ? AMPUTATION Left 12/25/2013  ? Procedure: AMPUTATION RAY LEFT 5TH RAY;  Surgeon: [name], MD;  Location: [facility];  Service: Orthopedics;  Laterality: Left;  ? AMPUTATION Right 12/15/2018  ? Procedure: AMPUTATION OF 4TH AND 5TH TOES RIGHT FOOT;  Surgeon: [name], MD;  Location: [facility];  Service: Orthopedics;  Laterality: Right;  ? AMPUTATION Left 02/02/2021  ? Procedure: LEFT FOOT 4TH RAY AMPUTATION;  Surgeon: [name], MD;  Location: [facility];  Service: Orthopedics;  Laterality: Left;  ? AMPUTATION Left 05/09/2021  ? Procedure: LEFT BELOW KNEE AMPUTATION;  Surgeon: [name], MD;  Location: [facility];  Service: Orthopedics;  Laterality: Left;  ? APPLICATION OF WOUND VAC Right 12/15/2018  ? Procedure: APPLICATION OF WOUND VAC;  Surgeon: [name], MD;  Location: [facility];  Service: Orthopedics;  Laterality: Right;  ? AV FISTULA PLACEMENT Left 03/25/2019  ? Procedure: LEFT ARM ARTERIOVENOUS (AV) FISTULA CREATION;  Surgeon: [name], MD;  Location: [facility];  Service: Vascular;  Laterality: Left;  ? BACK SURGERY    ? BASCILIC VEIN TRANSPOSITION Left 05/20/2019  ? Procedure: SECOND STAGE  LEFT BASCILIC VEIN TRANSPOSITION;  Surgeon: [name], MD;  Location: [facility];  Service: Vascular;  Laterality: Left;  ? CARDIAC CATHETERIZATION  02/17/2014  ? CHOLECYSTECTOMY    ? COLONOSCOPY  2011  ? in [place]

## 2021-08-06 ENCOUNTER — Ambulatory Visit (INDEPENDENT_AMBULATORY_CARE_PROVIDER_SITE_OTHER): Payer: Medicare Other | Admitting: Orthopedic Surgery

## 2021-08-06 DIAGNOSIS — S88112A Complete traumatic amputation at level between knee and ankle, left lower leg, initial encounter: Secondary | ICD-10-CM

## 2021-08-13 ENCOUNTER — Other Ambulatory Visit: Payer: Self-pay | Admitting: Internal Medicine

## 2021-08-15 ENCOUNTER — Telehealth: Payer: Self-pay | Admitting: Orthopedic Surgery

## 2021-08-15 NOTE — Telephone Encounter (Signed)
Scab came off, and the place is open 1.5 ? ?Just wanted someone to know ?

## 2021-08-16 NOTE — Telephone Encounter (Signed)
Noted  

## 2021-08-18 ENCOUNTER — Inpatient Hospital Stay (HOSPITAL_COMMUNITY)
Admission: EM | Admit: 2021-08-18 | Discharge: 2021-08-20 | DRG: 193 | Disposition: A | Discharge status: Home Health | Payer: Medicare Other | Attending: Family Medicine | Admitting: Family Medicine

## 2021-08-18 ENCOUNTER — Emergency Department (HOSPITAL_COMMUNITY): Payer: Medicare Other

## 2021-08-18 ENCOUNTER — Other Ambulatory Visit: Payer: Self-pay

## 2021-08-18 ENCOUNTER — Encounter (HOSPITAL_COMMUNITY): Payer: Self-pay | Admitting: *Deleted

## 2021-08-18 DIAGNOSIS — Y95 Nosocomial condition: Secondary | ICD-10-CM | POA: Diagnosis present

## 2021-08-18 DIAGNOSIS — Z951 Presence of aortocoronary bypass graft: Secondary | ICD-10-CM

## 2021-08-18 DIAGNOSIS — I152 Hypertension secondary to endocrine disorders: Secondary | ICD-10-CM | POA: Diagnosis present

## 2021-08-18 DIAGNOSIS — M898X9 Other specified disorders of bone, unspecified site: Secondary | ICD-10-CM | POA: Diagnosis present

## 2021-08-18 DIAGNOSIS — Z8249 Family history of ischemic heart disease and other diseases of the circulatory system: Secondary | ICD-10-CM

## 2021-08-18 DIAGNOSIS — E1159 Type 2 diabetes mellitus with other circulatory complications: Secondary | ICD-10-CM | POA: Diagnosis present

## 2021-08-18 DIAGNOSIS — Z9049 Acquired absence of other specified parts of digestive tract: Secondary | ICD-10-CM

## 2021-08-18 DIAGNOSIS — E7800 Pure hypercholesterolemia, unspecified: Secondary | ICD-10-CM | POA: Diagnosis present

## 2021-08-18 DIAGNOSIS — Z833 Family history of diabetes mellitus: Secondary | ICD-10-CM

## 2021-08-18 DIAGNOSIS — Z20822 Contact with and (suspected) exposure to covid-19: Secondary | ICD-10-CM | POA: Diagnosis present

## 2021-08-18 DIAGNOSIS — Z7985 Long-term (current) use of injectable non-insulin antidiabetic drugs: Secondary | ICD-10-CM

## 2021-08-18 DIAGNOSIS — E039 Hypothyroidism, unspecified: Secondary | ICD-10-CM | POA: Diagnosis present

## 2021-08-18 DIAGNOSIS — E1151 Type 2 diabetes mellitus with diabetic peripheral angiopathy without gangrene: Secondary | ICD-10-CM | POA: Diagnosis present

## 2021-08-18 DIAGNOSIS — N2581 Secondary hyperparathyroidism of renal origin: Secondary | ICD-10-CM | POA: Diagnosis present

## 2021-08-18 DIAGNOSIS — R5383 Other fatigue: Secondary | ICD-10-CM | POA: Diagnosis present

## 2021-08-18 DIAGNOSIS — Z89512 Acquired absence of left leg below knee: Secondary | ICD-10-CM

## 2021-08-18 DIAGNOSIS — I2510 Atherosclerotic heart disease of native coronary artery without angina pectoris: Secondary | ICD-10-CM | POA: Diagnosis present

## 2021-08-18 DIAGNOSIS — Z79890 Hormone replacement therapy (postmenopausal): Secondary | ICD-10-CM

## 2021-08-18 DIAGNOSIS — Z794 Long term (current) use of insulin: Secondary | ICD-10-CM

## 2021-08-18 DIAGNOSIS — Z800 Family history of malignant neoplasm of digestive organs: Secondary | ICD-10-CM

## 2021-08-18 DIAGNOSIS — Z79899 Other long term (current) drug therapy: Secondary | ICD-10-CM

## 2021-08-18 DIAGNOSIS — D631 Anemia in chronic kidney disease: Secondary | ICD-10-CM | POA: Diagnosis present

## 2021-08-18 DIAGNOSIS — Z992 Dependence on renal dialysis: Secondary | ICD-10-CM

## 2021-08-18 DIAGNOSIS — E119 Type 2 diabetes mellitus without complications: Secondary | ICD-10-CM

## 2021-08-18 DIAGNOSIS — J9601 Acute respiratory failure with hypoxia: Secondary | ICD-10-CM | POA: Diagnosis present

## 2021-08-18 DIAGNOSIS — J189 Pneumonia, unspecified organism: Secondary | ICD-10-CM | POA: Diagnosis not present

## 2021-08-18 DIAGNOSIS — E1122 Type 2 diabetes mellitus with diabetic chronic kidney disease: Secondary | ICD-10-CM | POA: Diagnosis present

## 2021-08-18 DIAGNOSIS — D696 Thrombocytopenia, unspecified: Secondary | ICD-10-CM | POA: Diagnosis present

## 2021-08-18 DIAGNOSIS — E1141 Type 2 diabetes mellitus with diabetic mononeuropathy: Secondary | ICD-10-CM | POA: Diagnosis present

## 2021-08-18 DIAGNOSIS — G9341 Metabolic encephalopathy: Secondary | ICD-10-CM | POA: Diagnosis present

## 2021-08-18 DIAGNOSIS — Z841 Family history of disorders of kidney and ureter: Secondary | ICD-10-CM

## 2021-08-18 DIAGNOSIS — G9340 Encephalopathy, unspecified: Secondary | ICD-10-CM | POA: Diagnosis not present

## 2021-08-18 DIAGNOSIS — R509 Fever, unspecified: Secondary | ICD-10-CM

## 2021-08-18 DIAGNOSIS — N186 End stage renal disease: Secondary | ICD-10-CM

## 2021-08-18 DIAGNOSIS — K219 Gastro-esophageal reflux disease without esophagitis: Secondary | ICD-10-CM | POA: Diagnosis present

## 2021-08-18 DIAGNOSIS — I4821 Permanent atrial fibrillation: Secondary | ICD-10-CM | POA: Diagnosis present

## 2021-08-18 DIAGNOSIS — E1169 Type 2 diabetes mellitus with other specified complication: Secondary | ICD-10-CM | POA: Diagnosis present

## 2021-08-18 DIAGNOSIS — Z8546 Personal history of malignant neoplasm of prostate: Secondary | ICD-10-CM

## 2021-08-18 DIAGNOSIS — Z9079 Acquired absence of other genital organ(s): Secondary | ICD-10-CM

## 2021-08-18 DIAGNOSIS — I480 Paroxysmal atrial fibrillation: Secondary | ICD-10-CM | POA: Diagnosis present

## 2021-08-18 DIAGNOSIS — Z87891 Personal history of nicotine dependence: Secondary | ICD-10-CM

## 2021-08-18 DIAGNOSIS — E1140 Type 2 diabetes mellitus with diabetic neuropathy, unspecified: Secondary | ICD-10-CM | POA: Diagnosis present

## 2021-08-18 DIAGNOSIS — Z87442 Personal history of urinary calculi: Secondary | ICD-10-CM

## 2021-08-18 DIAGNOSIS — I10 Essential (primary) hypertension: Secondary | ICD-10-CM | POA: Diagnosis present

## 2021-08-18 DIAGNOSIS — G253 Myoclonus: Secondary | ICD-10-CM | POA: Diagnosis present

## 2021-08-18 HISTORY — DX: Pneumonia, unspecified organism: J18.9

## 2021-08-18 LAB — LACTIC ACID, PLASMA: Lactic Acid, Venous: 1.5 mmol/L (ref 0.5–1.9)

## 2021-08-18 LAB — CBC WITH DIFFERENTIAL/PLATELET
Abs Immature Granulocytes: 0.1 10*3/uL — ABNORMAL HIGH (ref 0.00–0.07)
Basophils Absolute: 0.1 10*3/uL (ref 0.0–0.1)
Basophils Relative: 1 %
Eosinophils Absolute: 0.1 10*3/uL (ref 0.0–0.5)
Eosinophils Relative: 1 %
HCT: 35 % — ABNORMAL LOW (ref 39.0–52.0)
Hemoglobin: 11.6 g/dL — ABNORMAL LOW (ref 13.0–17.0)
Immature Granulocytes: 2 %
Lymphocytes Relative: 5 %
Lymphs Abs: 0.3 10*3/uL — ABNORMAL LOW (ref 0.7–4.0)
MCH: 28.9 pg (ref 26.0–34.0)
MCHC: 33.1 g/dL (ref 30.0–36.0)
MCV: 87.3 fL (ref 80.0–100.0)
Monocytes Absolute: 0.8 10*3/uL (ref 0.1–1.0)
Monocytes Relative: 12 %
Neutro Abs: 5 10*3/uL (ref 1.7–7.7)
Neutrophils Relative %: 79 %
Platelets: 113 10*3/uL — ABNORMAL LOW (ref 150–400)
RBC: 4.01 MIL/uL — ABNORMAL LOW (ref 4.22–5.81)
RDW: 16.4 % — ABNORMAL HIGH (ref 11.5–15.5)
WBC: 6.3 10*3/uL (ref 4.0–10.5)
nRBC: 0 % (ref 0.0–0.2)

## 2021-08-18 LAB — COMPREHENSIVE METABOLIC PANEL
ALT: 15 U/L (ref 0–44)
AST: 19 U/L (ref 15–41)
Albumin: 4 g/dL (ref 3.5–5.0)
Alkaline Phosphatase: 96 U/L (ref 38–126)
Anion gap: 12 (ref 5–15)
BUN: 46 mg/dL — ABNORMAL HIGH (ref 8–23)
CO2: 30 mmol/L (ref 22–32)
Calcium: 9.8 mg/dL (ref 8.9–10.3)
Chloride: 93 mmol/L — ABNORMAL LOW (ref 98–111)
Creatinine, Ser: 6.95 mg/dL — ABNORMAL HIGH (ref 0.61–1.24)
GFR, Estimated: 8 mL/min — ABNORMAL LOW (ref >60)
Glucose, Bld: 128 mg/dL — ABNORMAL HIGH (ref 70–99)
Potassium: 3.9 mmol/L (ref 3.5–5.1)
Sodium: 135 mmol/L (ref 135–145)
Total Bilirubin: 1.6 mg/dL — ABNORMAL HIGH (ref 0.3–1.2)
Total Protein: 6.7 g/dL (ref 6.5–8.1)

## 2021-08-18 LAB — PROTIME-INR
INR: 1.2 (ref 0.8–1.2)
Prothrombin Time: 14.8 seconds (ref 11.4–15.2)

## 2021-08-18 LAB — RESP PANEL BY RT-PCR (FLU A&B, COVID) ARPGX2
Influenza A by PCR: NEGATIVE
Influenza B by PCR: NEGATIVE
SARS Coronavirus 2 by RT PCR: NEGATIVE

## 2021-08-18 MED ORDER — PANTOPRAZOLE SODIUM 40 MG PO TBEC
40.0000 mg | DELAYED_RELEASE_TABLET | Freq: Every evening | ORAL | Status: DC
  Administered 2021-08-19: 40 mg via ORAL
  Filled 2021-08-18: qty 1

## 2021-08-18 MED ORDER — INSULIN ASPART 100 UNIT/ML IJ SOLN: 0.0000 [IU] | Freq: Three times a day (TID) | INTRAMUSCULAR | Status: DC

## 2021-08-18 MED ORDER — SODIUM CHLORIDE 0.9 % IV SOLN
500.0000 mg | INTRAVENOUS | Status: DC
  Administered 2021-08-19: 500 mg via INTRAVENOUS
  Filled 2021-08-18 (×2): qty 5

## 2021-08-18 MED ORDER — HEPARIN SODIUM (PORCINE) 5000 UNIT/ML IJ SOLN
5000.0000 [IU] | Freq: Three times a day (TID) | INTRAMUSCULAR | Status: DC
  Administered 2021-08-19 – 2021-08-20 (×4): 5000 [IU] via SUBCUTANEOUS
  Filled 2021-08-18 (×4): qty 1

## 2021-08-18 MED ORDER — SODIUM CHLORIDE 0.9 % IV SOLN
500.0000 mg | Freq: Once | INTRAVENOUS | Status: AC
  Administered 2021-08-18: 500 mg via INTRAVENOUS
  Filled 2021-08-18: qty 5

## 2021-08-18 MED ORDER — ONDANSETRON HCL 4 MG PO TABS: 4.0000 mg | ORAL_TABLET | Freq: Four times a day (QID) | ORAL | Status: DC | PRN

## 2021-08-18 MED ORDER — ACETAMINOPHEN 325 MG PO TABS: 650.0000 mg | ORAL_TABLET | Freq: Four times a day (QID) | ORAL | Status: DC | PRN

## 2021-08-18 MED ORDER — SODIUM CHLORIDE 0.9 % IV SOLN
1.0000 g | Freq: Once | INTRAVENOUS | Status: AC
  Administered 2021-08-18: 1 g via INTRAVENOUS
  Filled 2021-08-18: qty 10

## 2021-08-18 MED ORDER — ONDANSETRON HCL 4 MG/2ML IJ SOLN: 4.0000 mg | Freq: Four times a day (QID) | INTRAMUSCULAR | Status: DC | PRN

## 2021-08-18 MED ORDER — SODIUM CHLORIDE 0.9 % IV SOLN
2.0000 g | INTRAVENOUS | Status: DC
  Administered 2021-08-19: 2 g via INTRAVENOUS
  Filled 2021-08-18: qty 20

## 2021-08-18 MED ORDER — SODIUM CHLORIDE 0.9% FLUSH
3.0000 mL | Freq: Two times a day (BID) | INTRAVENOUS | Status: DC
  Administered 2021-08-19 (×2): 3 mL via INTRAVENOUS

## 2021-08-18 MED ORDER — LOSARTAN POTASSIUM 50 MG PO TABS
100.0000 mg | ORAL_TABLET | Freq: Every day | ORAL | Status: DC
  Administered 2021-08-19: 100 mg via ORAL
  Filled 2021-08-18: qty 2

## 2021-08-18 MED ORDER — ROSUVASTATIN CALCIUM 5 MG PO TABS
10.0000 mg | ORAL_TABLET | Freq: Every day | ORAL | Status: DC
  Administered 2021-08-19: 10 mg via ORAL
  Filled 2021-08-18: qty 2

## 2021-08-18 MED ORDER — SEVELAMER CARBONATE 800 MG PO TABS
800.0000 mg | ORAL_TABLET | Freq: Three times a day (TID) | ORAL | Status: DC
  Administered 2021-08-19 (×3): 800 mg via ORAL
  Filled 2021-08-18 (×3): qty 1

## 2021-08-18 MED ORDER — ALBUTEROL SULFATE (2.5 MG/3ML) 0.083% IN NEBU: 2.5000 mg | INHALATION_SOLUTION | RESPIRATORY_TRACT | Status: DC | PRN

## 2021-08-18 MED ORDER — LEVOTHYROXINE SODIUM 112 MCG PO TABS
112.0000 ug | ORAL_TABLET | Freq: Every day | ORAL | Status: DC
  Administered 2021-08-19 – 2021-08-20 (×2): 112 ug via ORAL
  Filled 2021-08-18 (×3): qty 1

## 2021-08-18 MED ORDER — SODIUM CHLORIDE 0.9 % IV BOLUS
500.0000 mL | Freq: Once | INTRAVENOUS | Status: AC
Start: 2021-08-18 — End: 2021-08-18
  Administered 2021-08-18: 500 mL via INTRAVENOUS

## 2021-08-18 MED ORDER — VANCOMYCIN HCL 2000 MG/400ML IV SOLN
2000.0000 mg | Freq: Once | INTRAVENOUS | Status: AC
  Administered 2021-08-19: 2000 mg via INTRAVENOUS
  Filled 2021-08-18: qty 400

## 2021-08-18 MED ORDER — ACETAMINOPHEN 500 MG PO TABS
1000.0000 mg | ORAL_TABLET | Freq: Once | ORAL | Status: AC
  Administered 2021-08-18: 1000 mg via ORAL
  Filled 2021-08-18: qty 2

## 2021-08-18 MED ORDER — GUAIFENESIN ER 600 MG PO TB12
600.0000 mg | ORAL_TABLET | Freq: Two times a day (BID) | ORAL | Status: DC
  Administered 2021-08-19 (×3): 600 mg via ORAL
  Filled 2021-08-18 (×3): qty 1

## 2021-08-18 MED ORDER — SENNOSIDES-DOCUSATE SODIUM 8.6-50 MG PO TABS: 1.0000 | ORAL_TABLET | Freq: Every evening | ORAL | Status: DC | PRN

## 2021-08-18 MED ORDER — ACETAMINOPHEN 650 MG RE SUPP
650.0000 mg | Freq: Four times a day (QID) | RECTAL | Status: DC | PRN
Start: 2021-08-18 — End: 2021-08-20

## 2021-08-18 MED ORDER — CARVEDILOL 12.5 MG PO TABS
12.5000 mg | ORAL_TABLET | Freq: Two times a day (BID) | ORAL | Status: DC
  Administered 2021-08-19 (×2): 12.5 mg via ORAL
  Filled 2021-08-18 (×2): qty 1

## 2021-08-18 NOTE — Assessment & Plan Note (Signed)
Per wife, follows with nephrology, Dr. Hollie Salk, and dialyzes at home usually on Sun/Wed/Fri.  Completed last HD 5/12.  No emergent need for HD at time of admission. ?-Nephrology consult in a.m. for usual dialysis needs ?

## 2021-08-18 NOTE — H&P (Signed)
?History and Physical  ? ? ?[name]:[id number] DOB: 03-16-1951 DOA: 08/18/2021 ? ?PCP: Lorenda Hatchet, FNP  ?Patient coming from: Home via EMS ? ?I have personally briefly reviewed patient's old medical records in [place] ? ?Chief Complaint: Cough, altered mental status ? ?HPI: ?[name] is a 71 y.o. male with medical history significant for ESRD on HD (per wife, dialyzes at home Sun/Wed/Fri), CAD s/p CABG, paroxysmal atrial fibrillation/flutter s/p ablation (not on anticoagulation or antiplatelets due to history of severe hematuria and GI bleed), insulin-dependent T2DM, HTN, HLD, hypothyroidism, anemia of CKD, prostate cancer s/p TURP, osteomyelitis of the left foot s/p BKA who presented to the ED for evaluation of fevers, cough, encephalopathy. ? ?Patient reports 5 days of nonproductive cough and fatigue.  He has diminished level of activity/interaction per wife.  She has noticed that he has intermittent jerking movement of his extremities which is new.  He has not had fevers at home.  He denies shortness of breath.  He is nonambulatory while awaiting prosthesis for his left lower extremity.  He has had low appetite but no nausea, vomiting, abdominal pain.  Wife states that patient still makes good amount of urine.  He denies any dysuria.  He has not had any diarrhea. ? ?Per wife, patient usually dialyzes at home on Sunday, Wednesday, Fridays and did complete dialysis yesterday (5/12). ? ?ED Course  Labs/Imaging on admission: I have personally reviewed following labs and imaging studies. ? ?Initial vitals showed BP 190/66, pulse 91, RR 17, temp 101.1 ?F, SPO2 95% on room air.  SPO2 dropped to 88% while on room air and patient was placed on 2 L O2 via [place] with improvement to 97%. ? ?Labs show WBC 6.3, hemoglobin 11.6, platelets 113,000, sodium 135, potassium 3.9, bicarb 30, BUN 46, creatinine 6.95, serum glucose 128, AST 19, LT 15, alk phos 96, total bilirubin 1.6, lactic acid 1.5. ? ?Blood  cultures collected and pending.  SARS-CoV-2 PCR negative.  Influenza PCR negative. ? ?2 view chest x-ray showed patchy left basilar opacity. ? ?Patient was given 500 cc normal saline, IV ceftriaxone and azithromycin, IV vancomycin.  The hospitalist service was consulted to admit for further evaluation and management. ? ?Review of Systems: All systems reviewed and are negative except as documented in history of present illness above. ? ? ?Past Medical History:  ?Diagnosis Date  ? Ambulates with cane   ? straight cane  ? Anemia   ? Cervical myelopathy ([place]) 02/06/2018  ? Chronic kidney disease   ? 89 M W F- home  ? Complication of anesthesia   ? Coronary artery disease   ? Diabetes mellitus without complication ([place])   ? type2  ? Diabetic foot ulcer ([place])   ? Diabetic neuropathy ([place]) 02/06/2018  ? Gait abnormality 08/24/2018  ? GERD (gastroesophageal reflux disease)   ?  01/06/20- not current  ? Gout   ? History of blood transfusion   ? History of blood transfusion   ? History of kidney stones   ? passed stones  ? Hypercholesteremia   ? Hypertension   ? Hypothyroidism   ? Neuromuscular disorder ([place])   ? neuropathy left leg and bilateral feet  ? Neuropathy   ? PONV (postoperative nausea and vomiting)   ? Prostate cancer ([place])   ? PVD (peripheral vascular disease) ([place])   ? with amputations  ? ? ?Past Surgical History:  ?Procedure Laterality Date  ? A-FLUTTER ABLATION N/A 04/06/2020  ? Procedure:  A-FLUTTER ABLATION;  Surgeon: [name], MD;  Location: [facility];  Service: Cardiovascular;  Laterality: N/A;  ? AMPUTATION Left 12/25/2013  ? Procedure: AMPUTATION RAY LEFT 5TH RAY;  Surgeon: [name], MD;  [facility];  Service: Orthopedics;  Laterality: Left;  ? AMPUTATION Right 12/15/2018  ? Procedure: AMPUTATION OF 4TH AND 5TH TOES RIGHT FOOT;  Surgeon: [name], MD;  Location: [facility];  Service: Orthopedics;  Laterality: Right;  ? AMPUTATION Left 02/02/2021  ? Procedure: LEFT FOOT 4TH RAY  AMPUTATION;  Surgeon: [name], MD;  Location: [facility];  Service: Orthopedics;  Laterality: Left;  ? AMPUTATION Left 05/09/2021  ? Procedure: LEFT BELOW KNEE AMPUTATION;  Surgeon: [name], MD;  Location: [facility];  Service: Orthopedics;  Laterality: Left;  ? APPLICATION OF WOUND VAC Right 12/15/2018  ? Procedure: APPLICATION OF WOUND VAC;  Surgeon: [name], MD;  Location: [facility];  Service: Orthopedics;  Laterality: Right;  ? AV FISTULA PLACEMENT Left 03/25/2019  ? Procedure: LEFT ARM ARTERIOVENOUS (AV) FISTULA CREATION;  Surgeon: [name], MD;  Location: [facility];  Service: Vascular;  Laterality: Left;  ? BACK SURGERY    ? BASCILIC VEIN TRANSPOSITION Left 05/20/2019  ? Procedure: SECOND STAGE LEFT BASCILIC VEIN TRANSPOSITION;  Surgeon: [name], MD;  Location: [facility];  Service: Vascular;  Laterality: Left;  ? CARDIAC CATHETERIZATION  02/17/2014  ? CHOLECYSTECTOMY    ? COLONOSCOPY  2011  ? in [place], [place]  ? [place] GRAFT  2008  ? CYSTOSCOPY N/A 06/29/2020  ? Procedure: CYSTOSCOPY WITH CLOT EVACUATION AND  FULGERATION;  Surgeon: [name], MD;  Location: [facility];  Service: Urology;  Laterality: N/A;  ? CYSTOSCOPY WITH FULGERATION Bilateral 06/17/2020  ? Procedure: CYSTOSCOPY,BILATERAL RETROGRADE, CLOT EVACUATION WITH FULGERATION OF THE BLADDER;  Surgeon: Janith Lima, MD;  Location: [facility];  Service: Urology;  Laterality: Bilateral;  ? [place] N/A 06/28/2020  ? Procedure: CYSTOSCOPY WITH CLOT EVACUATION AND FULGERATION OF BLEEDERS;  Surgeon: [name], MD;  Location: [facility];  Service: Urology;  Laterality: N/A;  1 HR  ? I & D EXTREMITY Right 12/15/2018  ? Procedure: DEBRIDEMENT RIGHT FOOT;  Surgeon: [name], MD;  Location: [facility];  Service: Orthopedics;  Laterality: Right;  ? I & D EXTREMITY Right 08/27/2019  ? Procedure: PARTIAL CUBOID EXCISION RIGHT FOOT;  Surgeon: [name], MD;  Location: [facility];  Service: Orthopedics;  Laterality:  Right;  ? I & D EXTREMITY Right 01/07/2020  ? Procedure: RIGHT FOOT EXCISION INFECTED BONE;  Surgeon: [name], MD;  Location: [facility];  Service: Orthopedics;  Laterality: Right;  ? NECK SURGERY    ? novemver 2019  ? STUMP REVISION Left 06/27/2021  ? Procedure: REVISION LEFT BELOW KNEE AMPUTATION;  Surgeon: [name], MD;  Location: [facility];  Service: Orthopedics;  Laterality: Left;  ? TRANSURETHRAL RESECTION OF PROSTATE N/A 06/28/2020  ? Procedure: TRANSURETHRAL RESECTION OF THE PROSTATE (TURP);  Surgeon: [name], MD;  Location: [facility];  Service: Urology;  Laterality: N/A;  ? WISDOM TOOTH EXTRACTION    ? ? ?Social History: ? reports that he quit smoking about 32 years ago. His smoking use included cigars. He started smoking about 48 years ago. He has never used smokeless tobacco. He reports that he does not drink alcohol and does not use drugs. ? ?Allergies  ?Allergen Reactions  ? Mushroom Extract  Complex Nausea Only  ? ? ?Family History  ?Problem Relation Age of Onset  ? Diabetes Mellitus II Mother   ? Kidney disease Mother   ? Diabetes Mellitus II Father   ? CAD Father   ? Cancer Father   ?     prostate  ? Kidney disease Father   ? Diabetes Mellitus II Brother   ? Kidney disease Brother   ? Diabetes Mellitus II Brother   ? Stomach cancer Brother 49  ? Kidney disease Brother   ? Colon cancer Neg Hx   ? Colon polyps Neg Hx   ? Esophageal cancer Neg Hx   ? Rectal cancer Neg Hx   ? Pancreatic cancer Neg Hx   ? ? ? ?Prior to Admission medications   ?Medication Sig Start Date End Date Taking? Authorizing Provider  ?acetaminophen (TYLENOL) 500 MG tablet Take 1,000 mg by mouth every 8 (eight) hours as needed for mild pain or headache.   Yes [provider]  ?allopurinol (ZYLOPRIM) 100 MG tablet Take 2 tablets (200 mg total) by mouth daily. 05/22/21  Yes Angiulli, Lavon Paganini, PA-C  ?B Complex-C-Zn-Folic Acid (DIALYVITE 350 WITH ZINC) 0.8 MG TABS Take 1 tablet by mouth daily. 05/22/21  Yes Angiulli,  Lavon Paganini, PA-C  ?calcitRIOL (ROCALTROL) 0.5 MCG capsule Take 1 capsule (0.5 mcg total) by mouth daily. 05/22/21  Yes Angiulli, Lavon Paganini, PA-C  ?carvedilol (COREG) 25 MG tablet Take 1 tablet (25 mg total)

## 2021-08-18 NOTE — ED Notes (Signed)
Providers made aware of possible sepsis  ?

## 2021-08-18 NOTE — Assessment & Plan Note (Signed)
New myoclonic activity noted in extremities.  May be secondary to infectious process, medication effect, or developing uremia in setting of ESRD.  No focal neurodeficit noted on admission. ?-Follow ammonia level ?-Continue antibiotics as above ?-Hold Lyrica ?-Nephrology consult in a.m. for usual HD needs ?

## 2021-08-18 NOTE — Assessment & Plan Note (Signed)
Continue home losartan and Coreg. ?

## 2021-08-18 NOTE — Assessment & Plan Note (Signed)
Continue Synthroid °

## 2021-08-18 NOTE — Assessment & Plan Note (Signed)
Somnolent with decreased level of interaction/activity from baseline per spouse.  He does awaken easily to answer questions briefly.  Likely secondary to acute illness.  Potentially compounded by high-dose Lyrica in setting of ESRD. ?

## 2021-08-18 NOTE — Hospital Course (Signed)
[name] is a 71 y.o. male with medical history significant for ESRD on HD (per wife, dialyzes at home Sun/Wed/Fri), CAD s/p CABG, paroxysmal atrial fibrillation/flutter s/p ablation (not on anticoagulation or antiplatelets due to history of severe hematuria and GI bleed), insulin-dependent T2DM, HTN, HLD, hypothyroidism, anemia of CKD, prostate cancer s/p TURP, osteomyelitis of the left foot s/p BKA who is admitted with left basilar pneumonia. ?

## 2021-08-18 NOTE — Assessment & Plan Note (Signed)
Mild without obvious bleeding.  Continue to monitor. 

## 2021-08-18 NOTE — Assessment & Plan Note (Signed)
Atrial flutter s/p ablation ?Remains in atrial fibrillation with controlled rate on admission.  Per prior cardiology documentation he is not on anticoagulation or antiplatelets due to history of severe hematuria and GI bleeding. ?-Continue Coreg 12.5 mg twice daily ?

## 2021-08-18 NOTE — ED Provider Notes (Signed)
?[facility] ?Provider Note ? ? ?CSN: [id number] ?Arrival date & time: 08/18/21  1957 ? ?  ? ?History ? ?Chief Complaint  ?Patient presents with  ? Altered Mental Status  ? ? ?[name] is a 71 y.o. male. ? ?Patient presents with gradually worsening productive cough for 3 days and general weakness and confusion for similar timeframe.  Patient had intermittent shaking today no generalized tonic-clonic seizures, no history of seizures.  Patient is slower to respond throughout today.  Patient dialyzes at home Monday Wednesday Friday, has not missed.  Patient's had fever that started today. ? ? ?  ? ?Home Medications ?Prior to Admission medications   ?Medication Sig Start Date End Date Taking? Authorizing Provider  ?acetaminophen (TYLENOL) 500 MG tablet Take 1,000 mg by mouth every 8 (eight) hours as needed for mild pain or headache.   Yes [provider]  ?allopurinol (ZYLOPRIM) 100 MG tablet Take 2 tablets (200 mg total) by mouth daily. 05/22/21  Yes Angiulli, Lavon Paganini, PA-C  ?B Complex-C-Zn-Folic Acid (DIALYVITE 376 WITH ZINC) 0.8 MG TABS Take 1 tablet by mouth daily. 05/22/21  Yes Angiulli, Lavon Paganini, PA-C  ?calcitRIOL (ROCALTROL) 0.5 MCG capsule Take 1 capsule (0.5 mcg total) by mouth daily. 05/22/21  Yes Angiulli, Lavon Paganini, PA-C  ?carvedilol (COREG) 25 MG tablet Take 1 tablet (25 mg total) by mouth 2 (two) times daily with a meal. ?Patient taking differently: Take 12.5 mg by mouth 2 (two) times daily with a meal. 05/22/21  Yes Angiulli, Lavon Paganini, PA-C  ?famotidine-calcium carbonate-magnesium hydroxide (PEPCID COMPLETE) 10-800-165 MG chewable tablet Chew 1 tablet by mouth daily as needed (heartburn).   Yes [provider]  ?Insulin Pen Needle 32G X 4 MM MISC Use to inject insulin up to 4 times daily as needed. 05/22/21  Yes Raulkar, Clide Deutscher, MD  ?Insulin Pen Needle 32G X 4 MM MISC Use to inject insulin up to 4 times daily as needed. 05/22/21  Yes Angiulli, Lavon Paganini, PA-C  ?levothyroxine (SYNTHROID) 112 MCG tablet Take 1 tablet (112 mcg total) by mouth daily before breakfast. 05/22/21  Yes Angiulli, Lavon Paganini, PA-C  ?losartan (COZAAR) 100 MG tablet Take 1 tablet (100 mg total) by mouth daily. 05/22/21  Yes Angiulli, Lavon Paganini, PA-C  ?methocarbamol (ROBAXIN) 500 MG tablet Take 1 tablet (500 mg total) by mouth 3 (three) times daily. ?Patient taking differently: Take 500 mg by mouth every 8 (eight) hours as needed for muscle spasms. 07/23/21  Yes [name], MD  ?pantoprazole (PROTONIX) 40 MG tablet Take 1 tablet (40 mg total) by mouth daily. ?Patient taking differently: Take 40 mg by mouth every evening. 05/22/21  Yes Angiulli, Lavon Paganini, PA-C  ?Polyethyl Glycol-Propyl Glycol (LUBRICANT EYE DROPS) 0.4-0.3 % SOLN Place 1-2 drops into both eyes in the morning and at bedtime.   Yes [provider]  ?pregabalin (LYRICA) 150 MG capsule Take 1 capsule by mouth in the morning and 2 capsules at night. 05/22/21  Yes Angiulli, Lavon Paganini, PA-C  ?prochlorperazine (COMPAZINE) 5 MG tablet Take 5 mg by mouth 2 (two) times daily. 06/01/21  Yes [provider]  ?rosuvastatin (CRESTOR) 10 MG tablet Take 1 tablet (10 mg total) by mouth daily. 05/22/21  Yes Angiulli, Lavon Paganini, PA-C  ?sevelamer carbonate (RENVELA) 800 MG tablet Take 1 tablet (800 mg total) by mouth 3 (three) times daily with meals. 05/22/21  Yes Angiulli, Lavon Paganini, PA-C  ?amitriptyline (ELAVIL) 25 MG tablet Take 1  tablet (25 mg total) by mouth at bedtime. ?Patient not taking: Reported on 06/22/2021 05/23/21   Cathlyn Parsons, PA-C  ?amoxicillin-clavulanate (AUGMENTIN) 875-125 MG tablet Take 1 tablet by mouth 2 (two) times daily. ?Patient not taking: Reported on 06/22/2021 06/12/21   [name], MD  ?ascorbic acid (VITAMIN C) 1000 MG tablet Take 1 tablet (1,000 mg total) by mouth daily. ?Patient not taking: Reported on 06/22/2021 05/22/21   Cathlyn Parsons, PA-C  ?ciprofloxacin (CIPRO) 500 MG tablet Take 1 tablet (500  mg total) by mouth daily. ?Patient not taking: Reported on 08/18/2021 07/02/21   [name], MD  ?Darbepoetin Alfa (ARANESP) 100 MCG/0.5ML SOSY injection Inject 0.5 mLs (100 mcg total) into the vein every Thursday with hemodialysis. 05/24/21   Angiulli, Lavon Paganini, PA-C  ?docusate sodium (COLACE) 100 MG capsule Take 1 capsule (100 mg total) by mouth 3 (three) times daily. ?Patient not taking: Reported on 06/22/2021 05/22/21   Angiulli, Lavon Paganini, PA-C  ?insulin aspart (NOVOLOG) 100 UNIT/ML FlexPen Inject 2 Units into the skin 3 (three) times daily with meals. ?Patient not taking: Reported on 06/22/2021 05/22/21   [place], Lavon Paganini, PA-C  ?Semaglutide,0.25 or 0.'5MG'$ /DOS, (OZEMPIC, 0.25 OR 0.5 MG/DOSE,) 2 MG/1.5ML SOPN Inject 0.5 mg into the skin every Friday. ?Patient taking differently: Inject 0.5 mg into the skin every Saturday at 6 PM. 05/25/21   Angiulli, Lavon Paganini, PA-C  ?traMADol (ULTRAM) 50 MG tablet Take 1 tablet (50 mg total) by mouth every 6 (six) hours as needed. ?Patient not taking: Reported on 06/22/2021 05/24/21 05/24/22  Angiulli, Lavon Paganini, PA-C  ?Vitamin D, Ergocalciferol, (DRISDOL) 1.25 MG (50000 UNIT) CAPS capsule Take 1 capsule (50,000 Units total) by mouth every 7 (seven) days. ?Patient not taking: Reported on 06/22/2021 05/25/21   [place], Lavon Paganini, PA-C  ?Zinc Sulfate 220 (50 Zn) MG TABS Take 1 tablet (220 mg total) by mouth daily. ?Patient not taking: Reported on 06/22/2021 05/22/21   [place], Lavon Paganini, PA-C  ?   ? ?Allergies    ?Mushroom extract complex   ? ?Review of Systems   ?Review of Systems  ?Unable to perform ROS: Mental status change  ? ?Physical Exam ?Updated Vital Signs ?BP (!) 156/56   Pulse 71   Temp 99.3 ?F (37.4 ?C) (Oral)   Resp (!) 24   SpO2 93%  ?Physical Exam ?Vitals and nursing note reviewed.  ?Constitutional:   ?   General: He is not in acute distress. ?   Appearance: He is well-developed.  ?HENT:  ?   Head: Normocephalic and atraumatic.  ?   Nose: Congestion present.  ?    Mouth/Throat:  ?   Mouth: Mucous membranes are moist.  ?Eyes:  ?   General:     ?   Right eye: No discharge.     ?   Left eye: No discharge.  ?   Conjunctiva/sclera: Conjunctivae normal.  ?Neck:  ?   Trachea: No tracheal deviation.  ?Cardiovascular:  ?   Rate and Rhythm: Normal rate and regular rhythm.  ?Pulmonary:  ?   Effort: Pulmonary effort is normal.  ?   Breath sounds: Rhonchi present.  ?Abdominal:  ?   General: There is no distension.  ?   Palpations: Abdomen is soft.  ?   Tenderness: There is no abdominal tenderness. There is no guarding.  ?Musculoskeletal:     ?   General: Normal range of motion.  ?   Cervical back: Normal range of motion and  neck supple. No rigidity.  ?Skin: ?   General: Skin is warm.  ?   Capillary Refill: Capillary refill takes less than 2 seconds.  ?   Findings: No rash.  ?   Comments: Patient has mild dehiscence of left BKA, no signs of infection, minimal erythema.  ?Neurological:  ?   General: No focal deficit present.  ?   Mental Status: He is alert.  ?   GCS: GCS eye subscore is 4. GCS verbal subscore is 4. GCS motor subscore is 6.  ?   Comments: Intermittent brief tremors, no focal or generalized seizures.  General weakness on exam.  No focality.  ?Psychiatric:  ?   Comments: Mild intermittent confusion, general weakness  ? ? ?ED Results / Procedures / Treatments   ?Labs ?(all labs ordered are listed, but only abnormal results are displayed) ?Labs Reviewed  ?CBC WITH DIFFERENTIAL/PLATELET - Abnormal; Notable for the following components:  ?    Result Value  ? RBC 4.01 (*)   ? Hemoglobin 11.6 (*)   ? HCT 35.0 (*)   ? RDW 16.4 (*)   ? Platelets 113 (*)   ? Lymphs Abs 0.3 (*)   ? Abs Immature Granulocytes 0.10 (*)   ? All other components within normal limits  ?COMPREHENSIVE METABOLIC PANEL - Abnormal; Notable for the following components:  ? Chloride 93 (*)   ? Glucose, Bld 128 (*)   ? BUN 46 (*)   ? Creatinine, Ser 6.95 (*)   ? Total Bilirubin 1.6 (*)   ? GFR, Estimated 8 (*)   ?  All other components within normal limits  ?CULTURE, BLOOD (ROUTINE X 2)  ?CULTURE, BLOOD (ROUTINE X 2)  ?RESP PANEL BY RT-PCR (FLU A&B, COVID) ARPGX2  ?LACTIC ACID, PLASMA  ?PROTIME-INR  ?LACTIC ACID, PLASMA  ?URIN

## 2021-08-18 NOTE — Assessment & Plan Note (Signed)
Clinical picture of cough, fever, CXR findings suggestive of left basilar pneumonia.  No other clear infectious source at this time.  COVID and flu negative. ?-Continue IV ceftriaxone and azithromycin ?-Follow blood cultures ?-Supplemental oxygen as needed ?

## 2021-08-18 NOTE — Assessment & Plan Note (Signed)
Continue rosuvastatin.  

## 2021-08-18 NOTE — ED Triage Notes (Signed)
Pt from home with [facility] c/o AMS x 1 week and productive cough x 3 days. Wife called ems for seizure like activity and jaundice. L BKA noted. Pt is MWF,fistula L upper arm. Noted to be leaning to the left. PtA&Ox3, slow to respond at times. EMS VS 194/80, pulse 80, 95% RA. EMS placed on 4L [place] ?

## 2021-08-18 NOTE — Assessment & Plan Note (Signed)
S/p CABG.  Denies any chest pain.  Continue Coreg and rosuvastatin.  Not on antiplatelets due to bleeding history. ?

## 2021-08-18 NOTE — Assessment & Plan Note (Signed)
Hemoglobin stable, continue to monitor. 

## 2021-08-18 NOTE — Assessment & Plan Note (Signed)
Place on very sensitive SSI. 

## 2021-08-19 DIAGNOSIS — Z89512 Acquired absence of left leg below knee: Secondary | ICD-10-CM | POA: Diagnosis not present

## 2021-08-19 DIAGNOSIS — Z794 Long term (current) use of insulin: Secondary | ICD-10-CM | POA: Diagnosis not present

## 2021-08-19 DIAGNOSIS — G253 Myoclonus: Secondary | ICD-10-CM | POA: Diagnosis present

## 2021-08-19 DIAGNOSIS — E039 Hypothyroidism, unspecified: Secondary | ICD-10-CM | POA: Diagnosis present

## 2021-08-19 DIAGNOSIS — D696 Thrombocytopenia, unspecified: Secondary | ICD-10-CM | POA: Diagnosis present

## 2021-08-19 DIAGNOSIS — K219 Gastro-esophageal reflux disease without esophagitis: Secondary | ICD-10-CM | POA: Diagnosis present

## 2021-08-19 DIAGNOSIS — E1141 Type 2 diabetes mellitus with diabetic mononeuropathy: Secondary | ICD-10-CM | POA: Diagnosis present

## 2021-08-19 DIAGNOSIS — I480 Paroxysmal atrial fibrillation: Secondary | ICD-10-CM | POA: Diagnosis present

## 2021-08-19 DIAGNOSIS — G9340 Encephalopathy, unspecified: Secondary | ICD-10-CM | POA: Diagnosis present

## 2021-08-19 DIAGNOSIS — E1151 Type 2 diabetes mellitus with diabetic peripheral angiopathy without gangrene: Secondary | ICD-10-CM | POA: Diagnosis present

## 2021-08-19 DIAGNOSIS — J189 Pneumonia, unspecified organism: Secondary | ICD-10-CM | POA: Diagnosis present

## 2021-08-19 DIAGNOSIS — E1169 Type 2 diabetes mellitus with other specified complication: Secondary | ICD-10-CM | POA: Diagnosis present

## 2021-08-19 DIAGNOSIS — I2510 Atherosclerotic heart disease of native coronary artery without angina pectoris: Secondary | ICD-10-CM | POA: Diagnosis present

## 2021-08-19 DIAGNOSIS — Z20822 Contact with and (suspected) exposure to covid-19: Secondary | ICD-10-CM | POA: Diagnosis present

## 2021-08-19 DIAGNOSIS — G9341 Metabolic encephalopathy: Secondary | ICD-10-CM | POA: Diagnosis present

## 2021-08-19 DIAGNOSIS — D631 Anemia in chronic kidney disease: Secondary | ICD-10-CM | POA: Diagnosis present

## 2021-08-19 DIAGNOSIS — J9601 Acute respiratory failure with hypoxia: Secondary | ICD-10-CM | POA: Diagnosis present

## 2021-08-19 DIAGNOSIS — N186 End stage renal disease: Secondary | ICD-10-CM | POA: Diagnosis present

## 2021-08-19 DIAGNOSIS — Z87442 Personal history of urinary calculi: Secondary | ICD-10-CM | POA: Diagnosis not present

## 2021-08-19 DIAGNOSIS — Y95 Nosocomial condition: Secondary | ICD-10-CM | POA: Diagnosis present

## 2021-08-19 DIAGNOSIS — E1122 Type 2 diabetes mellitus with diabetic chronic kidney disease: Secondary | ICD-10-CM | POA: Diagnosis present

## 2021-08-19 DIAGNOSIS — I152 Hypertension secondary to endocrine disorders: Secondary | ICD-10-CM | POA: Diagnosis present

## 2021-08-19 DIAGNOSIS — Z8546 Personal history of malignant neoplasm of prostate: Secondary | ICD-10-CM | POA: Diagnosis not present

## 2021-08-19 DIAGNOSIS — Z992 Dependence on renal dialysis: Secondary | ICD-10-CM | POA: Diagnosis not present

## 2021-08-19 DIAGNOSIS — E7800 Pure hypercholesterolemia, unspecified: Secondary | ICD-10-CM | POA: Diagnosis present

## 2021-08-19 DIAGNOSIS — N2581 Secondary hyperparathyroidism of renal origin: Secondary | ICD-10-CM | POA: Diagnosis present

## 2021-08-19 LAB — CBC
HCT: 31.1 % — ABNORMAL LOW (ref 39.0–52.0)
Hemoglobin: 9.7 g/dL — ABNORMAL LOW (ref 13.0–17.0)
MCH: 28.1 pg (ref 26.0–34.0)
MCHC: 31.2 g/dL (ref 30.0–36.0)
MCV: 90.1 fL (ref 80.0–100.0)
Platelets: 83 10*3/uL — ABNORMAL LOW (ref 150–400)
RBC: 3.45 MIL/uL — ABNORMAL LOW (ref 4.22–5.81)
RDW: 16.8 % — ABNORMAL HIGH (ref 11.5–15.5)
WBC: 6.3 10*3/uL (ref 4.0–10.5)
nRBC: 0 % (ref 0.0–0.2)

## 2021-08-19 LAB — URINALYSIS, ROUTINE W REFLEX MICROSCOPIC
Bilirubin Urine: NEGATIVE
Glucose, UA: NEGATIVE mg/dL
Ketones, ur: NEGATIVE mg/dL
Leukocytes,Ua: NEGATIVE
Nitrite: NEGATIVE
Protein, ur: 100 mg/dL — AB
Specific Gravity, Urine: 1.016 (ref 1.005–1.030)
pH: 6 (ref 5.0–8.0)

## 2021-08-19 LAB — COMPREHENSIVE METABOLIC PANEL
ALT: 13 U/L (ref 0–44)
AST: 12 U/L — ABNORMAL LOW (ref 15–41)
Albumin: 3.7 g/dL (ref 3.5–5.0)
Alkaline Phosphatase: 69 U/L (ref 38–126)
Anion gap: 18 — ABNORMAL HIGH (ref 5–15)
BUN: 51 mg/dL — ABNORMAL HIGH (ref 8–23)
CO2: 24 mmol/L (ref 22–32)
Calcium: 9.2 mg/dL (ref 8.9–10.3)
Chloride: 93 mmol/L — ABNORMAL LOW (ref 98–111)
Creatinine, Ser: 7.65 mg/dL — ABNORMAL HIGH (ref 0.61–1.24)
GFR, Estimated: 7 mL/min — ABNORMAL LOW (ref >60)
Glucose, Bld: 123 mg/dL — ABNORMAL HIGH (ref 70–99)
Potassium: 3.8 mmol/L (ref 3.5–5.1)
Sodium: 135 mmol/L (ref 135–145)
Total Bilirubin: 1.7 mg/dL — ABNORMAL HIGH (ref 0.3–1.2)
Total Protein: 6 g/dL — ABNORMAL LOW (ref 6.5–8.1)

## 2021-08-19 LAB — CBG MONITORING, ED
Glucose-Capillary: 124 mg/dL — ABNORMAL HIGH (ref 70–99)
Glucose-Capillary: 145 mg/dL — ABNORMAL HIGH (ref 70–99)
Glucose-Capillary: 116 mg/dL — ABNORMAL HIGH (ref 70–99)

## 2021-08-19 LAB — GLUCOSE, CAPILLARY: Glucose-Capillary: 113 mg/dL — ABNORMAL HIGH (ref 70–99)

## 2021-08-19 LAB — STREP PNEUMONIAE URINARY ANTIGEN: Strep Pneumo Urinary Antigen: NEGATIVE

## 2021-08-19 MED ORDER — CALCITRIOL 0.5 MCG PO CAPS
0.5000 ug | ORAL_CAPSULE | Freq: Every day | ORAL | Status: DC
  Filled 2021-08-19: qty 1

## 2021-08-19 MED ORDER — CALCITRIOL 0.5 MCG PO CAPS
0.5000 ug | ORAL_CAPSULE | Freq: Every day | ORAL | Status: DC
  Administered 2021-08-19: 0.5 ug via ORAL
  Filled 2021-08-19: qty 1

## 2021-08-19 NOTE — Evaluation (Signed)
Physical Therapy Evaluation ?Patient Details ?Name: [name] ?MRN: [id number] ?DOB: 1950/08/27 ?Today's Date: 08/19/2021 ? ?History of Present Illness ? The pt is a 71 yo male presenting 5/13 with AMS x1 week, cough x 3 days, and jaundice. Pt with recent L BKA (05/09/21) and is awaiting prosthetic. Work up revealed CAP without hypoxia, and acute metabolic encephalopathy. PMH includes: ESRD on HD Sun/W/F, afib s/p ablation, DM II, HTN, HLD, hypothyroidism, anemia, and prostate cancer s/p TUPP. ?  ?Clinical Impression ? Pt in bed upon arrival of PT, agreeable to evaluation at this time. Prior to admission the pt reports he was transferring from bed to [facility] independently (no family present to confirm PLOF). Evaluation complicated by pt's AMS, with cognitive deficits in attention, memory, command following, safety awareness, and problem solving noted with all mobility and discussion. The pt also presents with limitations in functional mobility, strength, motor planning, coordination, and stability due to above dx, and will continue to benefit from skilled PT to address these deficits. The pt required assist to complete transition to sitting and needed up to modA to maintain static sitting due to poor stability and tremors while sitting EOB. He attempted lateral scoot along EOB, and completed with minimal hip clearance and increased time/effort. Given deficits and increased assist needed from caregivers, would recommend SNF rehab to improve pt ability to transfer and maintain seated balance without assist prior to return home.  ?   ?   ? ?Recommendations for follow up therapy are one component of a multi-disciplinary discharge planning process, led by the attending physician.  Recommendations may be updated based on patient status, additional functional criteria and insurance authorization. ? ?Follow Up Recommendations Skilled nursing-short term rehab (<3 hours/day) ? ?  ?Assistance Recommended at Discharge Frequent or  constant Supervision/Assistance  ?Patient can return home with the following ? Two people to help with walking and/or transfers;Two people to help with bathing/dressing/bathroom;Assistance with cooking/housework;Direct supervision/assist for medications management;Assistance with feeding;Direct supervision/assist for financial management;Assist for transportation;Help with stairs or ramp for entrance ? ?  ?Equipment Recommendations None recommended by PT  ?Recommendations for Other Services ?    ?  ?Functional Status Assessment Patient has had a recent decline in their functional status and demonstrates the ability to make significant improvements in function in a reasonable and predictable amount of time.  ? ?  ?Precautions / Restrictions Precautions ?Precautions: Fall ?Precaution Comments: L BKA, does not have prosthetic ?Restrictions ?Weight Bearing Restrictions: No  ? ?  ? ?Mobility ? Bed Mobility ?Overal bed mobility: Needs Assistance ?Bed Mobility: Supine to Sit, Sit to Supine ?  ?  ?Supine to sit: Min assist ?Sit to supine: Min assist ?  ?General bed mobility comments: Poor motor planning throughout, unable to understand simple cues to lay back down, frequent attempts to flop back to lay down though sitting sideways on stretcher ?  ? ?Transfers ?Overall transfer level: Needs assistance ?  ?  ?  ?  ?  ?  ?  ?  ?General transfer comment: attempted lateral scoots on ED stretcher, noticable deficits in motor planning, able to scoot x2 then could no longer attempt due to confusion ?  ? ? ?   ? ?  ? ?Balance Overall balance assessment: Needs assistance ?Sitting-balance support: Bilateral upper extremity supported, Feet unsupported ?Sitting balance-Leahy Scale: Poor ?  ?Postural control: Posterior lean, Right lateral lean ?  ?  ?Standing balance comment: unable to attempt ?  ?  ?  ?  ?  ?  ?  ?  ?  ?  ?  ?   ? ? ? ?  Pertinent Vitals/Pain Pain Assessment ?Pain Assessment: Faces ?Faces Pain Scale: Hurts a little  bit ?Pain Location: generalized with movement ?Pain Intervention(s): Limited activity within patient's tolerance, Monitored during session, Repositioned  ? ? ?Home Living Family/patient expects to be discharged to:: Private residence ?Living Arrangements: Spouse/significant other ?Available Help at Discharge: Family;Personal care attendant;Available 24 hours/day ?Type of Home: House ?Home Access: Ramped entrance ?  ?  ?  ?Home Layout: Multi-level;Full bath on main level;Able to live on main level with bedroom/bathroom ?Home Equipment: Rollator (4 wheels);Shower seat;Cane - single Barista (2 wheels);Wheelchair - manual ?   ?  ?Prior Function Prior Level of Function : Needs assist ?  ?  ?  ?Physical Assist : Mobility (physical);ADLs (physical) ?  ?  ?Mobility Comments: pt reports transfers to [facility] on his own, does not walk while waiting for prosthetic ?ADLs Comments: reports he uses shower seat and can transfer over, slightly inconsistent with answers but also reports aide 4 hours a day that assists with "what he needs" ?  ? ? ?Hand Dominance  ? Dominant Hand: Right ? ?  ?Extremity/Trunk Assessment  ? Upper Extremity Assessment ?Upper Extremity Assessment: Defer to OT evaluation ?  ? ?Lower Extremity Assessment ?Lower Extremity Assessment: Generalized weakness;RLE deficits/detail;LLE deficits/detail ?RLE Deficits / Details: pt with AFO, chronic inversion without ability to actively evert or DF. pt also with wound on lateral aspect of R ankle with adhesion to sock, therefore unable to remove sock to inspect other skin. RN alerted. ?RLE Sensation: WNL ?RLE Coordination: decreased fine motor ?LLE Deficits / Details: chronic BKA, no prosthetic ?  ? ?Cervical / Trunk Assessment ?Cervical / Trunk Assessment: Kyphotic  ?Communication  ? Communication: No difficulties  ?Cognition Arousal/Alertness: Awake/alert ?Behavior During Therapy: Flat affect ?Overall Cognitive Status: Impaired/Different from baseline ?Area of  Impairment: Attention, Memory, Following commands, Safety/judgement, Awareness, Problem solving, Orientation ?  ?  ?  ?  ?  ?  ?  ?  ?Orientation Level:  (pt able to answer orientation questions correctly, did answer "May 2009 2023" when asked date.) ?Current Attention Level: Focused ?Memory: Decreased recall of precautions, Decreased short-term memory ?Following Commands: Follows one step commands inconsistently, Follows multi-step commands inconsistently ?Safety/Judgement: Decreased awareness of safety, Decreased awareness of deficits ?Awareness: Intellectual ?Problem Solving: Slow processing, Decreased initiation, Difficulty sequencing, Requires verbal cues, Requires tactile cues ?General Comments: When asked how he gets to the [facility] patient stating, "I go to the commode, by the commode, which is near the commode" poor motor planning throughout, only able to answer simply worded questions, fleeting attention, with frequent closing of eyes and long pauses between responses at times ?  ?  ? ?  ?General Comments General comments (skin integrity, edema, etc.): wound on posterior/lateral aspect of R ankle with sock adhered to woundbed. RN notified. ? ?  ?   ? ?Assessment/Plan  ?  ?PT Assessment Patient needs continued PT services  ?PT Problem List Decreased strength;Decreased activity tolerance;Decreased balance;Decreased range of motion;Decreased mobility;Decreased coordination;Decreased cognition;Decreased knowledge of use of DME;Decreased safety awareness;Decreased knowledge of precautions;Cardiopulmonary status limiting activity ? ?   ?  ?PT Treatment Interventions DME instruction;Gait training;Stair training;Functional mobility training;Therapeutic activities;Therapeutic exercise;Balance training;Patient/family education   ? ?PT Goals (Current goals can be found in the Care Plan section)  ?Acute Rehab PT Goals ?Patient Stated Goal: return home ?PT Goal Formulation: With patient ?Time For Goal Achievement:  09/02/21 ?Potential to Achieve Goals: Fair ? ?  ?Frequency Min 2X/week ?  ? ? ?Co-evaluation PT/OT/SLP Co-Evaluation/Treatment:  Yes ?Reason for Co-Treatment: Complexity of the patient's impairments (multi-system involve

## 2021-08-19 NOTE — Progress Notes (Signed)
?PROGRESS NOTE ? ? ? ?[name]  XKP:[id number] DOB: 1951-03-16 DOA: 08/18/2021 ?PCP: Lorenda Hatchet, FNP ? ? ?Brief Narrative:  ?HPI: ?[name] is a 71 y.o. male with medical history significant for ESRD on HD (per wife, dialyzes at home Sun/Wed/Fri), CAD s/p CABG, paroxysmal atrial fibrillation/flutter s/p ablation (not on anticoagulation or antiplatelets due to history of severe hematuria and GI bleed), insulin-dependent T2DM, HTN, HLD, hypothyroidism, anemia of CKD, prostate cancer s/p TURP, osteomyelitis of the left foot s/p BKA who presented to the ED for evaluation of fevers, cough, encephalopathy. ? ?Patient reports 5 days of nonproductive cough and fatigue.  He has diminished level of activity/interaction per wife.  She has noticed that he has intermittent jerking movement of his extremities which is new.  He has not had fevers at home.  He denies shortness of breath.  He is nonambulatory while awaiting prosthesis for his left lower extremity.  He has had low appetite but no nausea, vomiting, abdominal pain.  Wife states that patient still makes good amount of urine.  He denies any dysuria.  He has not had any diarrhea. ?  ?Per wife, patient usually dialyzes at home on Sunday, Wednesday, Fridays and did complete dialysis yesterday (5/12). ?  ?ED Course  Labs/Imaging on admission: I have personally reviewed following labs and imaging studies. ?  ?Initial vitals showed BP 190/66, pulse 91, RR 17, temp 101.1 ?F, SPO2 95% on room air.  SPO2 dropped to 88% while on room air and patient was placed on 2 L O2 via [place] with improvement to 97%. ?  ?Labs show WBC 6.3, hemoglobin 11.6, platelets 113,000, sodium 135, potassium 3.9, bicarb 30, BUN 46, creatinine 6.95, serum glucose 128, AST 19, LT 15, alk phos 96, total bilirubin 1.6, lactic acid 1.5. ?  ?Blood cultures collected and pending.  SARS-CoV-2 PCR negative.  Influenza PCR negative. ?  ?2 view chest x-ray showed patchy left basilar opacity. ?  ?Patient  was given 500 cc normal saline, IV ceftriaxone and azithromycin, IV vancomycin.  The hospitalist service was consulted to admit for further evaluation and management. ? ?Assessment & Plan: ?  ?Principal Problem: ?  Community acquired pneumonia of left lower lobe of lung ?Active Problems: ?  ESRD on hemodialysis ([name]) ?  Acute metabolic encephalopathy ?  Paroxysmal atrial fibrillation (HCC) ?  Action induced myoclonus ?  CAD (coronary artery disease) ?  Insulin dependent type 2 diabetes mellitus ([name]) ?  Hypertension associated with diabetes ([name]) ?  Hyperlipidemia associated with type 2 diabetes mellitus ([place]) ?  Hypothyroidism ?  Anemia of chronic renal failure ?  Thrombocytopenia ([name]) ? ?Community acquired pneumonia of left lower lobe of lung: He is not hypoxic.  Symptomatic with cough and some shortness of breath.  Continue Rocephin and Zithromax, follow culture, urine antigen for Legionella and streptococci. ?  ?Acute metabolic encephalopathy: Somnolent with decreased level of interaction/activity from baseline per spouse.  When asked questions, he is getting most of the questions right but needs reminders.  He wants to go home as soon as possible. ?  ?ESRD on hemodialysis ([name]) ?Per wife, follows with nephrology, Dr. Hollie Salk, and dialyzes at home usually on Sun/Wed/Fri.  Completed last HD 5/12.  Nephrology consulted. ?  ?Action induced myoclonus: myoclonic activity noted in right upper extremity.  Unsure of the duration and whether this is new or old.  No focal deficit.  Ammonia pending.  Presumably could be due to Lyrica which is on hold.  Will see if he improves after HD.  ? ?Paroxysmal atrial fibrillation ([place]) ?Atrial flutter s/p ablation ?Remains in atrial fibrillation with controlled rate on admission.  Per prior cardiology documentation he is not on anticoagulation or antiplatelets due to history of severe hematuria and GI bleeding. ?-Continue Coreg 12.5 mg twice daily ?  ?CAD (coronary artery  disease) ?S/p CABG.  Denies any chest pain.  Continue Coreg and rosuvastatin.  Not on antiplatelets due to bleeding history. ?  ?Insulin dependent type 2 diabetes mellitus ([place]): Hemoglobin A1c 5.53 months ago.  Continue SSI. ?  ?Hypertension associated with diabetes ([name]): Well-controlled. ?Continue home losartan and Coreg. ?  ?Chronic thrombocytopenia ([name]) ?Mild without obvious bleeding.  Continue to monitor. ?  ?Anemia of chronic renal failure/chronic disease ?Hemoglobin stable, continue to monitor. ?  ?Hypothyroidism ?Continue Synthroid. ?  ?Hyperlipidemia associated with type 2 diabetes mellitus ([name]) ?Continue rosuvastatin. ? ?DVT prophylaxis: heparin injection 5,000 Units Start: 08/19/21 0600 ?  Code Status: Full Code  ?Family Communication:  None present at bedside.  Plan of care discussed with patient in length and he/she verbalized understanding and agreed with it.  We will call wife later today. ? ?Status is: Observation ?The patient will require care spanning > 2 midnights and should be moved to inpatient because: Still not fully alert and back to baseline. ? ? ?Estimated body mass index is 29.99 kg/m? as calculated from the following: ?  Height as of this encounter: _0  (1.803 m). ?  Weight as of this encounter: 97.5 kg. ? ?Pressure Injury 05/22/21 Buttocks Left Stage 2 -  Partial thickness loss of dermis presenting as a shallow open injury with a red, pink wound bed without slough. Blanchable open tear on left butt cheek with some scabbing (Active)  ?05/22/21 2030  ?Location: Buttocks  ?Location Orientation: Left  ?Staging: Stage 2 -  Partial thickness loss of dermis presenting as a shallow open injury with a red, pink wound bed without slough. (staged by K. [name] Shark RN)  ?Wound Description (Comments): Blanchable open tear on left butt cheek with some scabbing  ?Present on Admission: No  ? ?Nutritional Assessment: ?Body mass index is 29.99 kg/m?Marland KitchenMarland Kitchen ?Seen by dietician.  I agree with the assessment and  plan as outlined below: ?Nutrition Status: ?  ?  ?  ? ?. ?Skin Assessment: ?I have examined the patient's skin and I agree with the wound assessment as performed by the wound care RN as outlined below: ?Pressure Injury 05/22/21 Buttocks Left Stage 2 -  Partial thickness loss of dermis presenting as a shallow open injury with a red, pink wound bed without slough. Blanchable open tear on left butt cheek with some scabbing (Active)  ?05/22/21 2030  ?Location: Buttocks  ?Location Orientation: Left  ?Staging: Stage 2 -  Partial thickness loss of dermis presenting as a shallow open injury with a red, pink wound bed without slough. (staged by K. [name] Shark RN)  ?Wound Description (Comments): Blanchable open tear on left butt cheek with some scabbing  ?Present on Admission: No  ? ? ?Consultants:  ?Nephrology ? ?Procedures:  ?None ? ?Antimicrobials:  ?Anti-infectives (From admission, onward)  ? ? Start     Dose/Rate Route Frequency Ordered Stop  ? 08/19/21 2200  azithromycin (ZITHROMAX) 500 mg in sodium chloride 0.9 % 250 mL IVPB       ? 500 mg ?250 mL/hr over 60 Minutes Intravenous Every 24 hours 08/18/21 2343 08/24/21 2159  ? 08/19/21 1200  cefTRIAXone (ROCEPHIN) 2 g in sodium  chloride 0.9 % 100 mL IVPB       ? 2 g ?200 mL/hr over 30 Minutes Intravenous Every 24 hours 08/18/21 2343 08/24/21 1159  ? 08/18/21 2315  vancomycin (VANCOREADY) IVPB 2000 mg/400 mL       ? 2,000 mg ?200 mL/hr over 120 Minutes Intravenous  Once 08/18/21 2301 08/19/21 0505  ? 08/18/21 2100  cefTRIAXone (ROCEPHIN) 1 g in sodium chloride 0.9 % 100 mL IVPB       ? 1 g ?200 mL/hr over 30 Minutes Intravenous  Once 08/18/21 2053 08/18/21 2201  ? 08/18/21 2100  azithromycin (ZITHROMAX) 500 mg in sodium chloride 0.9 % 250 mL IVPB       ? 500 mg ?250 mL/hr over 60 Minutes Intravenous  Once 08/18/21 2053 08/18/21 2306  ? ?  ?  ? ? ?Subjective: ?Seen and examined, still in the ED.  Patient sleepy but arousable.  Mostly oriented.  Denies any complaint other than  cough. ? ?Objective: ?Vitals:  ? 08/19/21 0600 08/19/21 0700 08/19/21 0745 08/19/21 0930  ?BP: (!) 172/73 (!) 173/60 (!) 169/77 (!) 148/54  ?Pulse: (!) 113  68 71  ?Resp: 17 11 (!) 21 20  ?Temp:      ?TempSrc:      ?S

## 2021-08-19 NOTE — NC FL2 (Signed)
?[place] MEDICAID FL2 LEVEL OF CARE SCREENING TOOL  ?  ? ?IDENTIFICATION  ?Patient Name: ?[name] Birthdate: 02-Jan-1951 Sex: male Admission Date (Current Location): ?08/18/2021  ?[place] and [place] Number: ? [place] ?  Facility and Address:  ?[facility] [place] [address], [place], [place] [address] ?     Provider Number: ?[id number]  ?Attending Physician Name and Address:  ?Darliss Cheney, MD ? Relative Name and Phone Number:  ?  ?   ?Current Level of Care: ?Hospital Recommended Level of Care: ?[place] Prior Approval Number: ?  ? ?Date Approved/Denied: ?  PASRR Number: ?[id number] ? ?Discharge Plan: ?SNF ?  ? ?Current Diagnoses: ?Patient Active Problem List  ? Diagnosis Date Noted  ? Community acquired pneumonia of left lower lobe of lung 08/18/2021  ? Paroxysmal atrial fibrillation ([place]) 08/18/2021  ? Anemia of chronic renal failure 08/18/2021  ? Thrombocytopenia ([place]) 08/18/2021  ? Acute metabolic encephalopathy 10/62/6948  ? Action induced myoclonus 08/18/2021  ? Dehiscence of amputation stump of left lower extremity (HCC)   ? Left below-knee amputee ([place]) 05/11/2021  ? S/P BKA (below knee amputation) unilateral, left ([place]) 05/09/2021  ? Partial nontraumatic amputation of foot, left ([place]) 02/20/2021  ? Abscess of bursa of left foot 02/03/2021  ? Cutaneous abscess of left foot   ? Acute osteomyelitis of metatarsal bone of left foot (HCC)   ? Symptomatic anemia 09/13/2020  ? COVID-19 virus infection 09/13/2020  ? Pressure injury of skin 06/29/2020  ? Gross hematuria 06/16/2020  ? Typical atrial flutter ([name]) 03/24/2020  ? Bilateral pleural effusion 02/24/2020  ? Healthcare maintenance 01/28/2020  ? Shortness of breath 01/28/2020  ? History of partial ray amputation of fourth toe of right foot ([place]) 09/08/2019  ? Ulcerated, foot, right, with necrosis of bone ([place])   ? Chronic cough 06/25/2019  ? Cutaneous abscess of right foot   ? Subacute osteomyelitis of right foot  ([place]) 12/14/2018  ? AKI (acute kidney injury) ([place]) 12/14/2018  ? ESRD on hemodialysis ([place]) 12/14/2018  ? Gait abnormality 08/24/2018  ? Diabetic neuropathy ([place]) 02/06/2018  ? Cervical myelopathy ([place]) 02/06/2018  ? Onychomycosis 10/30/2017  ? Other spondylosis with radiculopathy, cervical region 01/27/2017  ? Midfoot ulcer, right, limited to breakdown of skin ([place]) 11/15/2016  ? Lateral epicondylitis, left elbow 08/12/2016  ? Cellulitis of fifth toe of right foot 07/29/2016  ? Prostate cancer ([place]) 06/19/2016  ? Non-pressure chronic ulcer of other part of right foot limited to breakdown of skin ([place]) 04/04/2016  ? Cellulitis of leg, right 12/24/2015  ? Cellulitis of right lower extremity 12/24/2015  ? Gout 09/14/2012  ? Hypertension associated with diabetes ([place]) 09/14/2012  ? Hypothyroidism 09/14/2012  ? CAD (coronary artery disease) 09/14/2012  ? Insulin dependent type 2 diabetes mellitus ([place]) 09/14/2012  ? Hyperlipidemia associated with type 2 diabetes mellitus ([place])   ? Neuropathy ([place])   ? ? ?Orientation RESPIRATION BLADDER Height & Weight   ?  ?Self, Time, Situation, Place ? Normal Continent Weight: 215 lb (97.5 kg) ?Height:  '5\' 11"'$  (180.3 cm)  ?BEHAVIORAL SYMPTOMS/MOOD NEUROLOGICAL BOWEL NUTRITION STATUS  ?    Continent Diet (renal, carb modified)  ?AMBULATORY STATUS COMMUNICATION OF NEEDS Skin   ?Limited Assist Verbally Normal ?  ?  ?  ?    ?     ?     ? ? ?Personal Care Assistance Level of Assistance  ?Bathing, Feeding, Dressing Bathing Assistance: Limited assistance ?Feeding assistance: Independent ?Dressing  Assistance: Limited assistance ?   ? ?Functional Limitations Info  ?    ?  ?   ? ? ?SPECIAL CARE FACTORS FREQUENCY  ?PT (By licensed PT), OT (By licensed OT)   ?  ?PT Frequency: 5x weekly ?OT Frequency: 5x weekly ?  ?  ?  ?   ? ? ?Contractures Contractures Info: Not present  ? ? ?Additional Factors Info  ?Allergies, Code Status Code Status Info: full ?Allergies Info: NKDA ?  ?  ?  ?   ? ?Current  Medications (08/19/2021):  This is the current hospital active medication list ?Current Facility-Administered Medications  ?Medication Dose Route Frequency Provider Last Rate Last Admin  ? acetaminophen (TYLENOL) tablet 650 mg  650 mg Oral Q6H PRN Lenore Cordia, MD      ? Or  ? acetaminophen (TYLENOL) suppository 650 mg  650 mg Rectal Q6H PRN Lenore Cordia, MD      ? albuterol (PROVENTIL) (2.5 MG/3ML) 0.083% nebulizer solution 2.5 mg  2.5 mg Nebulization Q2H PRN Lenore Cordia, MD      ? azithromycin (ZITHROMAX) 500 mg in sodium chloride 0.9 % 250 mL IVPB  500 mg Intravenous Q24H Zada Finders R, MD      ? calcitRIOL (ROCALTROL) capsule 0.5 mcg  0.5 mcg Oral Daily Loren Racer, PA-C      ? carvedilol (COREG) tablet 12.5 mg  12.5 mg Oral BID WC Zada Finders R, MD   12.5 mg at 08/19/21 0746  ? cefTRIAXone (ROCEPHIN) 2 g in sodium chloride 0.9 % 100 mL IVPB  2 g Intravenous Q24H Zada Finders R, MD      ? guaiFENesin (MUCINEX) 12 hr tablet 600 mg  600 mg Oral BID Zada Finders R, MD   600 mg at 08/19/21 0957  ? heparin injection 5,000 Units  5,000 Units Subcutaneous Q8H Lenore Cordia, MD   5,000 Units at 08/19/21 3329  ? insulin aspart (novoLOG) injection 0-6 Units  0-6 Units Subcutaneous TID WC Zada Finders R, MD      ? levothyroxine (SYNTHROID) tablet 112 mcg  112 mcg Oral Q0600 Lenore Cordia, MD   112 mcg at 08/19/21 0553  ? losartan (COZAAR) tablet 100 mg  100 mg Oral Daily Zada Finders R, MD   100 mg at 08/19/21 5188  ? ondansetron (ZOFRAN) tablet 4 mg  4 mg Oral Q6H PRN Lenore Cordia, MD      ? Or  ? ondansetron (ZOFRAN) injection 4 mg  4 mg Intravenous Q6H PRN Lenore Cordia, MD      ? pantoprazole (PROTONIX) EC tablet 40 mg  40 mg Oral QPM Zada Finders R, MD      ? rosuvastatin (CRESTOR) tablet 10 mg  10 mg Oral Daily Lenore Cordia, MD   10 mg at 08/19/21 4166  ? senna-docusate (Senokot-S) tablet 1 tablet  1 tablet Oral QHS PRN Lenore Cordia, MD      ? sevelamer carbonate (RENVELA)  tablet 800 mg  800 mg Oral TID WC Zada Finders R, MD   800 mg at 08/19/21 0746  ? sodium chloride flush (NS) 0.9 % injection 3 mL  3 mL Intravenous Q12H Lenore Cordia, MD   3 mL at 08/19/21 0948  ? ?Current Outpatient Medications  ?Medication Sig Dispense Refill  ? acetaminophen (TYLENOL) 500 MG tablet Take 1,000 mg by mouth every 8 (eight) hours as needed for mild pain or headache.    ? allopurinol (  ZYLOPRIM) 100 MG tablet Take 2 tablets (200 mg total) by mouth daily. 30 tablet 0  ? B Complex-C-Zn-Folic Acid (DIALYVITE 884 WITH ZINC) 0.8 MG TABS Take 1 tablet by mouth daily. 30 tablet 0  ? calcitRIOL (ROCALTROL) 0.5 MCG capsule Take 1 capsule (0.5 mcg total) by mouth daily. 30 capsule 0  ? carvedilol (COREG) 25 MG tablet Take 1 tablet (25 mg total) by mouth 2 (two) times daily with a meal. (Patient taking differently: Take 12.5 mg by mouth 2 (two) times daily with a meal.) 60 tablet 0  ? famotidine-calcium carbonate-magnesium hydroxide (PEPCID COMPLETE) 10-800-165 MG chewable tablet Chew 1 tablet by mouth daily as needed (heartburn).    ? Insulin Pen Needle 32G X 4 MM MISC Use to inject insulin up to 4 times daily as needed. 100 each 0  ? Insulin Pen Needle 32G X 4 MM MISC Use to inject insulin up to 4 times daily as needed. 100 each 0  ? levothyroxine (SYNTHROID) 112 MCG tablet Take 1 tablet (112 mcg total) by mouth daily before breakfast. 30 tablet 0  ? losartan (COZAAR) 100 MG tablet Take 1 tablet (100 mg total) by mouth daily. 30 tablet 0  ? methocarbamol (ROBAXIN) 500 MG tablet Take 1 tablet (500 mg total) by mouth 3 (three) times daily. (Patient taking differently: Take 500 mg by mouth every 8 (eight) hours as needed for muscle spasms.) 30 tablet 0  ? pantoprazole (PROTONIX) 40 MG tablet Take 1 tablet (40 mg total) by mouth daily. (Patient taking differently: Take 40 mg by mouth every evening.) 30 tablet 0  ? Polyethyl Glycol-Propyl Glycol (LUBRICANT EYE DROPS) 0.4-0.3 % SOLN Place 1-2 drops into both eyes  in the morning and at bedtime.    ? pregabalin (LYRICA) 150 MG capsule Take 1 capsule by mouth in the morning and 2 capsules at night. 90 capsule 0  ? prochlorperazine (COMPAZINE) 5 MG tablet Take 5 mg by mo

## 2021-08-19 NOTE — ED Notes (Signed)
Pt's R sock stuck on the ulcer on his R ankle. This RN removed the sock and performed the wound care on the ulcer.  ?

## 2021-08-19 NOTE — Evaluation (Signed)
Occupational Therapy Evaluation ?Patient Details ?Name: [name] ?MRN: [id number] ?DOB: Dec 16, 1950 ?Today's Date: 08/19/2021 ? ? ?History of Present Illness The pt is a 71 yo male presenting 5/13 with AMS x1 week, cough x 3 days, and jaundice. Pt with recent L BKA (05/09/21) and is awaiting prosthetic. Work up revealed CAP without hypoxia, and acute metabolic encephalopathy. PMH includes: ESRD on HD Sun/W/F, afib s/p ablation, DM II, HTN, HLD, hypothyroidism, anemia, and prostate cancer s/p TUPP.  ? ?Clinical Impression ?  ?Prior to this admission, patient was living with his wife and receiving assist from her and PCA to engage in ADLs (PCA comes 4 days a week). Patient no longer drives, does not manage his medication or participate in IADLs with the exception of reading. Currently, patient presenting with significant confusion, poor activity tolerance and with wound on R posterior aspect of foot (sock adhered to wound therefore unable to fully assess with RN notified). Patient mod to max A for ADLs, and requiring +2 assist for basic transfers. Patient attempted lateral scoots on ED stretcher, noticable deficits in motor planning, able to scoot x2 then could no longer attempt due to confusion frequently asking, "what do I do?". OT recommending SNF level rehab due to functional deficits, OT will continue to follow acutely.  ?  ?   ? ?Recommendations for follow up therapy are one component of a multi-disciplinary discharge planning process, led by the attending physician.  Recommendations may be updated based on patient status, additional functional criteria and insurance authorization.  ? ?Follow Up Recommendations ? Skilled nursing-short term rehab (<3 hours/day)  ?  ?Assistance Recommended at Discharge Frequent or constant Supervision/Assistance  ?Patient can return home with the following A lot of help with walking and/or transfers;A lot of help with bathing/dressing/bathroom;Assistance with cooking/housework;Direct  supervision/assist for medications management;Direct supervision/assist for financial management;Assist for transportation;Help with stairs or ramp for entrance ? ?  ?Functional Status Assessment ? Patient has had a recent decline in their functional status and demonstrates the ability to make significant improvements in function in a reasonable and predictable amount of time.  ?Equipment Recommendations ? Other (comment) (Will continue to assess)  ?  ?Recommendations for Other Services   ? ? ?  ?Precautions / Restrictions Precautions ?Precautions: Fall ?Precaution Comments: L BKA, does not have prosthetic  ? ?  ? ?Mobility Bed Mobility ?Overal bed mobility: Needs Assistance ?Bed Mobility: Supine to Sit, Sit to Supine ?  ?  ?Supine to sit: Min assist ?Sit to supine: Min assist ?  ?General bed mobility comments: Poor motor planning throughout, unable to understand simple cues to lay back down, frequent attempts to flop back to lay down though sitting sideways on stretcher ?  ? ?Transfers ?Overall transfer level: Needs assistance ?  ?  ?  ?  ?  ?  ?  ?  ?General transfer comment: attempted lateral scoots on ED stretcher, noticable deficits in motor planning, able to scoot x2 then could no longer attempt due to confusion ?  ? ?  ?Balance Overall balance assessment: Needs assistance ?Sitting-balance support: Bilateral upper extremity supported, Feet unsupported ?Sitting balance-Leahy Scale: Poor ?  ?Postural control: Posterior lean, Right lateral lean ?  ?  ?Standing balance comment: unable to attempt ?  ?  ?  ?  ?  ?  ?  ?  ?  ?  ?  ?   ? ?ADL either performed or assessed with clinical judgement  ? ?ADL Overall ADL's : Needs assistance/impaired ?Eating/Feeding:  Set up;Bed level ?  ?Grooming: Set up;Bed level ?  ?Upper Body Bathing: Minimal assistance;Sitting ?  ?Lower Body Bathing: Moderate assistance;Maximal assistance;Sitting/lateral leans ?  ?Upper Body Dressing : Set up;Bed level ?  ?Lower Body Dressing: Moderate  assistance;Maximal assistance;Sitting/lateral leans;Sit to/from stand ?  ?Toilet Transfer: Moderate assistance;Maximal assistance;+2 for safety/equipment;+2 for physical assistance;Stand-pivot;BSC/3in1 ?  ?Toileting- Clothing Manipulation and Hygiene: Moderate assistance;Maximal assistance;Sitting/lateral lean ?  ?  ?  ?Functional mobility during ADLs: Moderate assistance;Cueing for safety;Cueing for sequencing ?General ADL Comments: Patient presenting with significant confusion, myoclonic jerking, and poor activity tolerance  ? ? ? ?Vision Baseline Vision/History: 0 No visual deficits ?Ability to See in Adequate Light: 0 Adequate ?Patient Visual Report: No change from baseline ?Additional Comments: Will continue to assess, patient poor historian  ?   ?Perception   ?  ?Praxis   ?  ? ?Pertinent Vitals/Pain Pain Assessment ?Pain Assessment: Faces ?Faces Pain Scale: Hurts a little bit ?Pain Location: generalized with movement ?Pain Intervention(s): Limited activity within patient's tolerance, Monitored during session, Repositioned  ? ? ? ?Hand Dominance Right ?  ?Extremity/Trunk Assessment Upper Extremity Assessment ?Upper Extremity Assessment: Generalized weakness ?  ?Lower Extremity Assessment ?Lower Extremity Assessment: Defer to PT evaluation ?  ?Cervical / Trunk Assessment ?Cervical / Trunk Assessment: Kyphotic ?  ?Communication Communication ?Communication: No difficulties ?  ?Cognition Arousal/Alertness: Awake/alert ?Behavior During Therapy: Flat affect ?Overall Cognitive Status: Impaired/Different from baseline ?Area of Impairment: Attention, Memory, Following commands, Safety/judgement, Awareness, Problem solving, Orientation ?  ?  ?  ?  ?  ?  ?  ?  ?Orientation Level:  (pt able to answer orientation questions correctly, did answer "May 2009 2023" when asked date.) ?Current Attention Level: Focused ?Memory: Decreased recall of precautions, Decreased short-term memory ?Following Commands: Follows one step  commands inconsistently, Follows multi-step commands inconsistently ?Safety/Judgement: Decreased awareness of safety, Decreased awareness of deficits ?Awareness: Intellectual ?Problem Solving: Slow processing, Decreased initiation, Difficulty sequencing, Requires verbal cues, Requires tactile cues ?General Comments: When asked how he gets to the [facility] patient stating, "I go to the commode, by the commode, which is near the commode" poor motor planning throughout, only able to answer simply worded questions, fleeting attention, with frequent closing of eyes and long pauses between responses at times ?  ?  ?General Comments  Wound on posterior aspect of R foot, sock has adhered to wound, RN notified ? ?  ?Exercises   ?  ?Shoulder Instructions    ? ? ?Home Living Family/patient expects to be discharged to:: Private residence ?Living Arrangements: Spouse/significant other ?Available Help at Discharge: Family;Personal care attendant;Available 24 hours/day ?Type of Home: House ?Home Access: Ramped entrance ?  ?  ?Home Layout: Multi-level;Full bath on main level;Able to live on main level with bedroom/bathroom ?  ?  ?Bathroom Shower/Tub: Walk-in shower ?  ?Bathroom Toilet: Standard ?Bathroom Accessibility: No (pt states he can't reach toilet in bathroom, but can reach shower with WC) ?  ?Home Equipment: Rollator (4 wheels);Shower seat;Cane - single Barista (2 wheels);Wheelchair - manual ?  ?  ?  ? ?  ?Prior Functioning/Environment Prior Level of Function : Needs assist ?  ?  ?  ?Physical Assist : Mobility (physical);ADLs (physical) ?  ?  ?Mobility Comments: pt reports transfers to [facility] on his own, does not walk while waiting for prosthetic ?ADLs Comments: reports he uses shower seat and can transfer over, slightly inconsistent with answers but also reports aide 4 hours a day that assists with "what he needs" ?  ? ?  ?  ?  OT Problem List: Decreased strength;Decreased range of motion;Decreased activity  tolerance;Impaired balance (sitting and/or standing);Decreased coordination;Decreased cognition;Decreased safety awareness;Decreased knowledge of use of DME or AE;Decreased knowledge of precautions ?  ?   ?OT Treatment/

## 2021-08-19 NOTE — Consult Note (Addendum)
[facility] ?Renal Consultation Note  ?  ?Indication for Consultation:  Management of ESRD/hemodialysis, anemia, hypertension/volume, and secondary hyperparathyroidism. ?PCP: ? ?HPI: [name] is a 71 y.o. male with ESRD on HHD (NxStage), HTN, T2DM, CAD (Hx CABG), A-fib, hypothyroidism, Hx prostate cancer (s/p TURP/radiation), hypothyroidism, and Hx L BKA for foot osteomyelitis who was admitted with AMS and pneumonia. ? ?Seen in room - he is somnolent and slow to respond to questions. Hx is somewhat limited, and reinforced with H&P. Presented to ED 5/13 evening via EMS with AMS and productive cough for the past week, and possible seizure like activity. In the ED, he was slightly hypertensive. He was afebrile but hypoxic requiring O2. Labs with Na 135, K 3.9, BUN 46, Cr 6.95, Ca 9.8, Alb 4, WBC 6.3, Hgb 11.6, LA 1.5. No seizure observed, felt that was more c/w myoclonic jerks. CXR suspicious for  L basilar pneumonia. COVID/Flu negative. Blood Cx drawn, and he was started on Ceftriaxone/Azithro and was admitted.  ? ?Dialyzes at home on NxStage machine, but only 3d/week (SuWF). Last treatment was Friday 5/12. No recent dialysis issues. Has LUE AVF which is patent without signs of infection. ? ?Past Medical History:  ?Diagnosis Date  ? Ambulates with cane   ? straight cane  ? Anemia   ? Cervical myelopathy ([place]) 02/06/2018  ? Chronic kidney disease   ? 69 M W F- home  ? Complication of anesthesia   ? Coronary artery disease   ? Diabetes mellitus without complication ([place])   ? type2  ? Diabetic foot ulcer ([place])   ? Diabetic neuropathy ([place]) 02/06/2018  ? Gait abnormality 08/24/2018  ? GERD (gastroesophageal reflux disease)   ?  01/06/20- not current  ? Gout   ? History of blood transfusion   ? History of blood transfusion   ? History of kidney stones   ? passed stones  ? Hypercholesteremia   ? Hypertension   ? Hypothyroidism   ? Neuromuscular disorder ([place])   ? neuropathy left leg and bilateral  feet  ? Neuropathy   ? PONV (postoperative nausea and vomiting)   ? Prostate cancer ([place])   ? PVD (peripheral vascular disease) ([place])   ? with amputations  ? ?Past Surgical History:  ?Procedure Laterality Date  ? A-FLUTTER ABLATION N/A 04/06/2020  ? Procedure: A-FLUTTER ABLATION;  Surgeon: [name], MD;  Location: [facility];  Service: Cardiovascular;  Laterality: N/A;  ? AMPUTATION Left 12/25/2013  ? Procedure: AMPUTATION RAY LEFT 5TH RAY;  Surgeon: [name], MD;  Location: [facility];  Service: Orthopedics;  Laterality: Left;  ? AMPUTATION Right 12/15/2018  ? Procedure: AMPUTATION OF 4TH AND 5TH TOES RIGHT FOOT;  Surgeon: [name], MD;  Location: [place];  Service: Orthopedics;  Laterality: Right;  ? AMPUTATION Left 02/02/2021  ? Procedure: LEFT FOOT 4TH RAY AMPUTATION;  Surgeon: [name], MD;  Location: [name];  Service: Orthopedics;  Laterality: Left;  ? AMPUTATION Left 05/09/2021  ? Procedure: LEFT BELOW KNEE AMPUTATION;  Surgeon: [name], MD;  Location: [place];  Service: Orthopedics;  Laterality: Left;  ? APPLICATION OF WOUND VAC Right 12/15/2018  ? Procedure: APPLICATION OF WOUND VAC;  Surgeon: [name], MD;  Location: [place];  Service: Orthopedics;  Laterality: Right;  ? AV FISTULA PLACEMENT Left 03/25/2019  ? Procedure: LEFT ARM ARTERIOVENOUS (AV) FISTULA CREATION;  Surgeon: [name], MD;  Location: [place];  Service: Vascular;  Laterality: Left;  ? BACK SURGERY    ? BASCILIC VEIN TRANSPOSITION Left 05/20/2019  ? Procedure: SECOND STAGE LEFT BASCILIC VEIN TRANSPOSITION;  Surgeon: [name], MD;  Location: [place];  Service: Vascular;  Laterality: Left;  ? CARDIAC CATHETERIZATION  02/17/2014  ? CHOLECYSTECTOMY    ? COLONOSCOPY  2011  ? in [place], Normal  ? [place] GRAFT  2008  ? CYSTOSCOPY N/A 06/29/2020  ? Procedure: CYSTOSCOPY WITH CLOT EVACUATION AND  FULGERATION;  Surgeon: [name], MD;  Location: [place];  Service: Urology;  Laterality:  N/A;  ? CYSTOSCOPY WITH FULGERATION Bilateral 06/17/2020  ? Procedure: CYSTOSCOPY,BILATERAL RETROGRADE, CLOT EVACUATION WITH FULGERATION OF THE BLADDER;  Surgeon: Janith Lima, MD;  Location: [place];  Service: Urology;  Laterality: Bilateral;  ? [place] N/A 06/28/2020  ? Procedure: CYSTOSCOPY WITH CLOT EVACUATION AND FULGERATION OF BLEEDERS;  Surgeon: [name], MD;  Location: [place];  Service: Urology;  Laterality: N/A;  1 HR  ? I & D EXTREMITY Right 12/15/2018  ? Procedure: DEBRIDEMENT RIGHT FOOT;  Surgeon: [name], MD;  Location: [place];  Service: Orthopedics;  Laterality: Right;  ? I & D EXTREMITY Right 08/27/2019  ? Procedure: PARTIAL CUBOID EXCISION RIGHT FOOT;  Surgeon: [name], MD;  Location: [place];  Service: Orthopedics;  Laterality: Right;  ? I & D EXTREMITY Right 01/07/2020  ? Procedure: RIGHT FOOT EXCISION INFECTED BONE;  Surgeon: [name], MD;  Location: [place];  Service: Orthopedics;  Laterality: Right;  ? NECK SURGERY    ? novemver 2019  ? STUMP REVISION Left 06/27/2021  ? Procedure: REVISION LEFT BELOW KNEE AMPUTATION;  Surgeon: [name], MD;  Location: [place];  Service: Orthopedics;  Laterality: Left;  ? TRANSURETHRAL RESECTION OF PROSTATE N/A 06/28/2020  ? Procedure: TRANSURETHRAL RESECTION OF THE PROSTATE (TURP);  Surgeon: [name], MD;  Location: [place];  Service: Urology;  Laterality: N/A;  ? WISDOM TOOTH EXTRACTION    ? ?Family History  ?Problem Relation Age of Onset  ? Diabetes Mellitus II Mother   ? Kidney disease Mother   ? Diabetes Mellitus II Father   ? CAD Father   ? Cancer Father   ?     prostate  ? Kidney disease Father   ? Diabetes Mellitus II Brother   ? Kidney disease Brother   ? Diabetes Mellitus II Brother   ? Stomach cancer Brother 71  ? Kidney disease Brother   ? Colon cancer Neg Hx   ? Colon polyps Neg Hx   ? Esophageal cancer Neg Hx   ? Rectal cancer Neg Hx   ? Pancreatic cancer Neg Hx   ? ?Social History: ? reports that he  quit smoking about 32 years ago. His smoking use included cigars. He started smoking about 48 years ago. He has never used smokeless tobacco. He reports that he does not drink alcohol and does not use drugs. ? ?ROS: Limited d/t AMS. ?+ cough, mild dyspnea, fatigue, weakness ?- HA, CP, abdominal pain, nausea, vomiting, diarrhea, fever ? ?Physical Exam: ?Vitals:  ? 08/19/21 0745 08/19/21 0930 08/19/21 1015 08/19/21 1145  ?BP: (!) 169/77 (!) 148/54 (!) 143/48   ?Pulse: 68 71  73  ?Resp: (!) '21 20 15 20  '$ ?Temp:      ?TempSrc:      ?SpO2: 93% 93%  98%  ?Weight:      ?Height:      ?   ?  General: Well developed, NAD albeit somnolent and slow to answer questions. Nasal O2 in place. ?Head: Normocephalic, atraumatic, sclera non-icteric, mucus membranes are moist. ?Neck: Supple without lymphadenopathy/masses. JVD not elevated. ?Lungs: CTAB; no wheezing on rales on my exam but very low resp effort ?Heart: RRR with normal S1, S2. No murmurs, rubs, or gallops appreciated. ?Abdomen: Soft, non-tender, non-distended with normoactive bowel sounds. Musculoskeletal:  Strength and tone appear normal for age. ?Lower extremities: L BKA, no RLE edema ?Neuro: Somnolent, answers questions correctly for the most part but very slowly ?Dialysis Access: LUE AVF + thrill ? ?Allergies  ?Allergen Reactions  ? Mushroom Extract Complex Nausea Only  ? ?Prior to Admission medications   ?Medication Sig Start Date End Date Taking? Authorizing Provider  ?acetaminophen (TYLENOL) 500 MG tablet Take 1,000 mg by mouth every 8 (eight) hours as needed for mild pain or headache.   Yes [provider]  ?allopurinol (ZYLOPRIM) 100 MG tablet Take 2 tablets (200 mg total) by mouth daily. 05/22/21  Yes Angiulli, Lavon Paganini, PA-C  ?B Complex-C-Zn-Folic Acid (DIALYVITE 758 WITH ZINC) 0.8 MG TABS Take 1 tablet by mouth daily. 05/22/21  Yes Angiulli, Lavon Paganini, PA-C  ?calcitRIOL (ROCALTROL) 0.5 MCG capsule Take 1 capsule (0.5 mcg total) by mouth daily. 05/22/21  Yes  Angiulli, Lavon Paganini, PA-C  ?carvedilol (COREG) 25 MG tablet Take 1 tablet (25 mg total) by mouth 2 (two) times daily with a meal. ?Patient taking differently: Take 12.5 mg by mouth 2 (two) times daily with a

## 2021-08-19 NOTE — ED Notes (Signed)
Pt refused to change into hospital gown

## 2021-08-20 DIAGNOSIS — J189 Pneumonia, unspecified organism: Secondary | ICD-10-CM | POA: Diagnosis not present

## 2021-08-20 LAB — RENAL FUNCTION PANEL
Albumin: 3.3 g/dL — ABNORMAL LOW (ref 3.5–5.0)
Anion gap: 16 — ABNORMAL HIGH (ref 5–15)
BUN: 64 mg/dL — ABNORMAL HIGH (ref 8–23)
CO2: 23 mmol/L (ref 22–32)
Calcium: 8.9 mg/dL (ref 8.9–10.3)
Chloride: 99 mmol/L (ref 98–111)
Creatinine, Ser: 8.73 mg/dL — ABNORMAL HIGH (ref 0.61–1.24)
GFR, Estimated: 6 mL/min — ABNORMAL LOW (ref >60)
Glucose, Bld: 110 mg/dL — ABNORMAL HIGH (ref 70–99)
Phosphorus: 4.7 mg/dL — ABNORMAL HIGH (ref 2.5–4.6)
Potassium: 3.7 mmol/L (ref 3.5–5.1)
Sodium: 138 mmol/L (ref 135–145)

## 2021-08-20 LAB — CBC
HCT: 27.8 % — ABNORMAL LOW (ref 39.0–52.0)
Hemoglobin: 9 g/dL — ABNORMAL LOW (ref 13.0–17.0)
MCH: 28.3 pg (ref 26.0–34.0)
MCHC: 32.4 g/dL (ref 30.0–36.0)
MCV: 87.4 fL (ref 80.0–100.0)
Platelets: 82 10*3/uL — ABNORMAL LOW (ref 150–400)
RBC: 3.18 MIL/uL — ABNORMAL LOW (ref 4.22–5.81)
RDW: 16.5 % — ABNORMAL HIGH (ref 11.5–15.5)
WBC: 4.7 10*3/uL (ref 4.0–10.5)
nRBC: 0 % (ref 0.0–0.2)

## 2021-08-20 LAB — GLUCOSE, CAPILLARY: Glucose-Capillary: 103 mg/dL — ABNORMAL HIGH (ref 70–99)

## 2021-08-20 MED ORDER — CEFDINIR 300 MG PO CAPS: 300.0000 mg | ORAL_CAPSULE | ORAL | 0 refills | Status: AC

## 2021-08-20 MED ORDER — DARBEPOETIN ALFA 60 MCG/0.3ML IJ SOSY: 60.0000 ug | PREFILLED_SYRINGE | INTRAMUSCULAR | Status: DC

## 2021-08-20 NOTE — Progress Notes (Signed)
?[facility] ?Progress Note  ? ?Subjective:   Patient seen and examined at bedside during dialysis.  Tolerating well so far.  Feeling much better today.  Still a little slow to respond but alert and answering all questions appropriately.  Admits to mild pain with coughing.  Denies CP, SOB, abdominal, n/v/d, weakness, dizziness and fatigue.  Tremors/jerking improved today.  ? ?Objective ?Vitals:  ? 08/20/21 0100 08/20/21 0726 08/20/21 0841 08/20/21 0843  ?BP:  (!) 154/61 (!) 152/63   ?Pulse:  69 68   ?Resp: '20 16 19   '$ ?Temp:  98.9 ?F (37.2 ?C) 99.2 ?F (37.3 ?C)   ?TempSrc:  Oral Oral   ?SpO2:  94% 97%   ?Weight:    87.7 kg  ?Height:      ? ?Physical Exam ?General:chronically ill appearing alert male in NAD ?Heart:RRR, no mrg ?Lungs:CTAB anterolaterally  ?Abdomen:soft, NTND ?Extremities:no LE edema ?Dialysis Access: LU AVF in use  ? ?Filed Weights  ? 08/18/21 2300 08/20/21 0843  ?Weight: 97.5 kg 87.7 kg  ? ? ?Intake/Output Summary (Last 24 hours) at 08/20/2021 0853 ?Last data filed at 08/20/2021 0300 ?Gross per 24 hour  ?Intake 350.4 ml  ?Output 220 ml  ?Net 130.4 ml  ? ? ?Additional Objective ?Labs: ?Basic Metabolic Panel: ?Recent Labs  ?Lab 08/18/21 ?2052 08/19/21 ?0610 08/20/21 ?0144  ?NA 135 135 138  ?K 3.9 3.8 3.7  ?CL 93* 93* 99  ?CO2 '30 24 23  '$ ?GLUCOSE 128* 123* 110*  ?BUN 46* 51* 64*  ?CREATININE 6.95* 7.65* 8.73*  ?CALCIUM 9.8 9.2 8.9  ?PHOS  --   --  4.7*  ? ?Liver Function Tests: ?Recent Labs  ?Lab 08/18/21 ?2052 08/19/21 ?0610 08/20/21 ?0144  ?AST 19 12*  --   ?ALT 15 13  --   ?ALKPHOS 96 69  --   ?BILITOT 1.6* 1.7*  --   ?PROT 6.7 6.0*  --   ?ALBUMIN 4.0 3.7 3.3*  ? ? ?CBC: ?Recent Labs  ?Lab 08/18/21 ?2034 08/19/21 ?0610 08/20/21 ?0144  ?WBC 6.3 6.3 4.7  ?NEUTROABS 5.0  --   --   ?HGB 11.6* 9.7* 9.0*  ?HCT 35.0* 31.1* 27.8*  ?MCV 87.3 90.1 87.4  ?PLT 113* 83* 82*  ? ?Blood Culture ?   ?Component Value Date/Time  ? SDES BLOOD RIGHT FOREARM 08/18/2021 2118  ? SPECREQUEST  08/18/2021 2118  ?   BOTTLES DRAWN AEROBIC AND ANAEROBIC Blood Culture adequate volume  ? CULT  08/18/2021 2118  ?  NO GROWTH < 12 HOURS ?Performed at [facility], [place] [address] [place], [place] [address] ?  ? REPTSTATUS PENDING 08/18/2021 2118  ? ? ?CBG: ?Recent Labs  ?Lab 08/19/21 ?0734 08/19/21 ?1352 08/19/21 ?1657 08/19/21 ?2152 08/20/21 ?5284  ?GLUCAP 124* 145* 116* 113* 103*  ? ? ?Studies/Results: ?DG Chest 2 View ? ?Result Date: 08/18/2021 ?CLINICAL DATA:  Suspected Sepsis EXAM: CHEST - 2 VIEW COMPARISON:  Chest radiograph dated May 26, 2020 FINDINGS: Evaluation is limited secondary to patient positioning. Grossly unchanged cardiomediastinal silhouette status post median sternotomy. Patchy LEFT basilar heterogeneous opacities. No pleural effusion or pneumothorax. Degenerative changes of the thoracic spine. IMPRESSION: Evaluation is limited secondary to patient inability to tolerate exam. Patchy LEFT basilar predominant opacities. Differential considerations include infection, atelectasis or aspiration. Consider repeat radiograph when patient better able to tolerate examination. Electronically Signed   By: Valentino Saxon M.D.   On: 08/18/2021 20:52   ? ?Medications: ? azithromycin 250 mL/hr at 08/20/21 0547  ?  cefTRIAXone (ROCEPHIN)  IV Stopped (08/19/21 1614)  ? ? calcitRIOL  0.5 mcg Oral Daily  ? carvedilol  12.5 mg Oral BID WC  ? guaiFENesin  600 mg Oral BID  ? heparin  5,000 Units Subcutaneous Q8H  ? insulin aspart  0-6 Units Subcutaneous TID WC  ? levothyroxine  112 mcg Oral Q0600  ? losartan  100 mg Oral Daily  ? pantoprazole  40 mg Oral QPM  ? rosuvastatin  10 mg Oral Daily  ? sevelamer carbonate  800 mg Oral TID WC  ? sodium chloride flush  3 mL Intravenous Q12H  ? ? ?Dialysis Orders: ?HHD/NxStage on SuWedFri schedule. Managed by Dr. Hollie Salk ?- LUE AVF, last HD 5/12, EDW 95kg ?- BMM meds: calcitriol 0.25mg PO QD, Renvela 1/meals ?  ?Assessment/Plan: ? LLL HCAP: On Ceftriaxone/Azithromycin ? AMS: In setting  of infection and meds most likely.  Closer to baseline.  ? Myoclonic jerking: BUN ~50, unlikely uremic. Was on high dose Lyrica '450mg'$ /d -> on hold now, agree this is the likely culprit.  Improved today.  ? ESRD: Change to traditional MWF schedule while admitted -> HD today. ? Hypertension/volume: BP elevated this AM, no edema on exam. Expect improvement post. UF as tolerated. ? Anemia: Hgb 9.0, order aranesp 611m qMon to be given today.  ? Metabolic bone disease: Ca and phos in goal. Continue home meds. ? Nutrition:  Renal diet w/fluid restrictions. Alb 3.3, adding supplements. ? CAD/Hx CABG ? T2DM ? Hypothyroidism ? ?LiJen MowPA-C ?[facility] ?08/20/2021,8:53 AM ? LOS: 1 day  ?\ ?

## 2021-08-20 NOTE — Progress Notes (Signed)
D/C order noted. Pt receives home HD through [facility] home therapy unit. Spoke to [facility] therapy RN, to make staff aware pt will d/c today to resume home HD at d/c.  ? ?Melven Sartorius ?Renal Navigator ?[phone] ?

## 2021-08-20 NOTE — Progress Notes (Signed)
?PROGRESS NOTE ? ? ? ?[name]  YBO:[id number] DOB: 02/01/1951 DOA: 08/18/2021 ?PCP: Lorenda Hatchet, FNP ? ? ?Brief Narrative:  ?[name] is a 71 y.o. male with medical history significant for ESRD on HD (per wife, dialyzes at home Sun/Wed/Fri), CAD s/p CABG, paroxysmal atrial fibrillation/flutter s/p ablation (not on anticoagulation or antiplatelets due to history of severe hematuria and GI bleed), insulin-dependent T2DM, HTN, HLD, hypothyroidism, anemia of CKD, prostate cancer s/p TURP, osteomyelitis of the left foot s/p BKA who presented to the ED for evaluation of fevers, cough, encephalopathy.  Wife had noticed that he has intermittent jerking movement of his extremities which is new.  He is nonambulatory while awaiting prosthesis for his left lower extremity.  ?  ?Initial vitals showed BP 190/66, pulse 91, RR 17, temp 101.1 ?F, SPO2 95% on room air.  SPO2 dropped to 88% while on room air and patient was placed on 2 L O2 via [place] with improvement to 97%.  Influenza and flu negative. 2 view chest x-ray showed patchy left basilar opacity. Patient was given 500 cc normal saline, IV ceftriaxone and azithromycin, IV vancomycin. . ? ?Assessment & Plan: ?  ?Principal Problem: ?  Community acquired pneumonia of left lower lobe of lung ?Active Problems: ?  ESRD on hemodialysis ([place]) ?  Acute metabolic encephalopathy ?  Paroxysmal atrial fibrillation (HCC) ?  Action induced myoclonus ?  CAD (coronary artery disease) ?  Insulin dependent type 2 diabetes mellitus ([place]) ?  Hypertension associated with diabetes ([place]) ?  Hyperlipidemia associated with type 2 diabetes mellitus ([place]) ?  Hypothyroidism ?  Anemia of chronic renal failure ?  Thrombocytopenia ([place]) ? ?Acute hypoxic respiratory failure community acquired pneumonia of left lower lobe of lung: Reportedly, he was briefly hypoxic in the ED required 2 L but improved.  He has remained off of oxygen since yesterday.  Currently has no symptoms.  Urine streptococcal  negative.  All cultures negative.  Continue Rocephin and Zithromax. ? ?Acute metabolic encephalopathy: Improved.  Fully alert and oriented.  Was likely secondary to pneumonia. ?  ?ESRD on hemodialysis ([place]) ?Per wife, follows with nephrology, Dr. Hollie Salk, and dialyzes at home usually on Sun/Wed/Fri.  Completed last HD 5/12.  Nephrology consulted. ?  ?Action induced myoclonus: myoclonic activity noted in right upper extremity yesterday has resolved now. ? ?Paroxysmal atrial fibrillation ([place]) ?Atrial flutter s/p ablation ?Remains in atrial fibrillation with controlled rate on admission.  Per prior cardiology documentation he is not on anticoagulation or antiplatelets due to history of severe hematuria and GI bleeding. ?-Continue Coreg 12.5 mg twice daily ?  ?CAD (coronary artery disease) ?S/p CABG.  Denies any chest pain.  Continue Coreg and rosuvastatin.  Not on antiplatelets due to bleeding history. ?  ?Insulin dependent type 2 diabetes mellitus ([place]): Hemoglobin A1c 5.53 months ago.  Continue SSI. ?  ?Hypertension associated with diabetes ([name]): Well-controlled. ?Continue home losartan and Coreg. ?  ?Chronic thrombocytopenia ([place]) ?Mild without obvious bleeding.  Continue to monitor. ?  ?Anemia of chronic renal failure/chronic disease ?Hemoglobin stable, continue to monitor. ?  ?Hypothyroidism ?Continue Synthroid. ?  ?Hyperlipidemia associated with type 2 diabetes mellitus ([place]) ?Continue rosuvastatin. ? ?Deconditioning/generalized weakness: Assessed by PT OT yesterday.  They recommended SNF.  Patient is now fully alert and oriented.  Discussed with him about PT recommendations.  Patient believes that he has improved significantly compared to yesterday and he does not think that he will need to go to rehabilitation.  He requested another  assessment today after dialysis and based on those recommendations, he will give it a thought. ? ?DVT prophylaxis: heparin injection 5,000 Units Start: 08/19/21 0600 ?  Code Status:  Full Code  ?Family Communication:  None present at bedside.  Plan of care discussed with patient in length and he/she verbalized understanding and agreed with it.   ? ?Status is: Inpatient ?Remains inpatient appropriate because: To be assessed by PT OT. ? ? ? ?Estimated body mass index is 26.97 kg/m? as calculated from the following: ?  Height as of this encounter: '5\' 11"'$  (1.803 m). ?  Weight as of this encounter: 87.7 kg. ? ?Pressure Injury 05/22/21 Buttocks Left Stage 2 -  Partial thickness loss of dermis presenting as a shallow open injury with a red, pink wound bed without slough. Blanchable open tear on left butt cheek with some scabbing (Active)  ?05/22/21 2030  ?Location: Buttocks  ?Location Orientation: Left  ?Staging: Stage 2 -  Partial thickness loss of dermis presenting as a shallow open injury with a red, pink wound bed without slough. (staged by K. [name] Shark RN)  ?Wound Description (Comments): Blanchable open tear on left butt cheek with some scabbing  ?Present on Admission: No  ? ?Nutritional Assessment: ?Body mass index is 26.97 kg/m?Marland KitchenMarland Kitchen ?Seen by dietician.  I agree with the assessment and plan as outlined below: ?Nutrition Status: ?  ?  ?  ? ?. ?Skin Assessment: ?I have examined the patient's skin and I agree with the wound assessment as performed by the wound care RN as outlined below: ?Pressure Injury 05/22/21 Buttocks Left Stage 2 -  Partial thickness loss of dermis presenting as a shallow open injury with a red, pink wound bed without slough. Blanchable open tear on left butt cheek with some scabbing (Active)  ?05/22/21 2030  ?Location: Buttocks  ?Location Orientation: Left  ?Staging: Stage 2 -  Partial thickness loss of dermis presenting as a shallow open injury with a red, pink wound bed without slough. (staged by K. [name] Shark RN)  ?Wound Description (Comments): Blanchable open tear on left butt cheek with some scabbing  ?Present on Admission: No  ? ? ?Consultants:  ?Nephrology ? ?Procedures:   ?None ? ?Antimicrobials:  ?Anti-infectives (From admission, onward)  ? ? Start     Dose/Rate Route Frequency Ordered Stop  ? 08/19/21 2200  azithromycin (ZITHROMAX) 500 mg in sodium chloride 0.9 % 250 mL IVPB       ? 500 mg ?250 mL/hr over 60 Minutes Intravenous Every 24 hours 08/18/21 2343 08/24/21 2159  ? 08/19/21 1200  cefTRIAXone (ROCEPHIN) 2 g in sodium chloride 0.9 % 100 mL IVPB       ? 2 g ?200 mL/hr over 30 Minutes Intravenous Every 24 hours 08/18/21 2343 08/24/21 1159  ? 08/18/21 2315  vancomycin (VANCOREADY) IVPB 2000 mg/400 mL       ? 2,000 mg ?200 mL/hr over 120 Minutes Intravenous  Once 08/18/21 2301 08/19/21 0505  ? 08/18/21 2100  cefTRIAXone (ROCEPHIN) 1 g in sodium chloride 0.9 % 100 mL IVPB       ? 1 g ?200 mL/hr over 30 Minutes Intravenous  Once 08/18/21 2053 08/18/21 2201  ? 08/18/21 2100  azithromycin (ZITHROMAX) 500 mg in sodium chloride 0.9 % 250 mL IVPB       ? 500 mg ?250 mL/hr over 60 Minutes Intravenous  Once 08/18/21 2053 08/18/21 2306  ? ?  ?  ? ? ?Subjective: ? ?Seen and examined in dialysis.  He is fully  alert and oriented.  He states that he feels better, he has no complaints. ? ?Objective: ?Vitals:  ? 08/20/21 0850 08/20/21 0919 08/20/21 0930 08/20/21 1000  ?BP: (!) 130/41 (!) 132/58 (!) 139/57 (!) 135/56  ?Pulse: 61 69 72 72  ?Resp: '18 20 16 15  '$ ?Temp:      ?TempSrc:      ?SpO2:  98% 98%   ?Weight:      ?Height:      ? ? ?Intake/Output Summary (Last 24 hours) at 08/20/2021 1029 ?Last data filed at 08/20/2021 0300 ?Gross per 24 hour  ?Intake 350.4 ml  ?Output 220 ml  ?Net 130.4 ml  ? ? ?Filed Weights  ? 08/18/21 2300 08/20/21 0843  ?Weight: 97.5 kg 87.7 kg  ? ? ?Examination: ? ?General exam: Appears calm and comfortable  ?Respiratory system: Clear to auscultation. Respiratory effort normal.  No more rhonchi today. ?Cardiovascular system: S1 & S2 heard, RRR. No JVD, murmurs, rubs, gallops or clicks. No pedal edema. ?Gastrointestinal system: Abdomen is nondistended, soft and nontender.  No organomegaly or masses felt. Normal bowel sounds heard. ?Central nervous system: Alert and oriented. No focal neurological deficits. ?Extremities: Symmetric 5 x 5 power. ?Skin: No rashes, lesions or ulcers.

## 2021-08-20 NOTE — TOC Transition Note (Signed)
Transition of Care (TOC) - CM/SW Discharge Note ? ? ?Patient Details  ?Name: [name] ?MRN: [id number] ?Date of Birth: [birth year] ? ?Transition of Care (TOC) CM/SW Contact:  ?Bartholomew Crews, RN ?Phone Number: [phone] ?08/20/2021, 1:48 PM ? ? ?Clinical Narrative:    ? ?Spoke with patient and spouse on his mobile phone to discuss post acute transition. Agreeable to [facility] PT/OT. Already active with a home health agency. Verified patient active with [facility]. No further TOC needs identified at this time.  ? ?Final next level of care: [place] ?Barriers to Discharge: No Barriers Identified ? ? ?Patient Goals and CMS Choice ?Patient states their goals for this hospitalization and ongoing recovery are:: return home with wife ?CMS Medicare.gov Compare Post Acute Care list provided to:: Patient ?Choice offered to / list presented to : Patient, Spouse ? ?Discharge Placement ?  ?           ?  ?  ?  ?  ? ?Discharge Plan and Services ?  ?  ?           ?DME Arranged: N/A ?DME Agency: NA ?  ?  ?  ?HH Arranged: PT, OT ?[place] Agency: [facility] ?Date HH Agency Contacted: 08/20/21 ?Time [place]: 0349 ?Representative spoke with at [place]: Hoyle Sauer ? ?Social Determinants of Health (SDOH) Interventions ?  ? ? ?Readmission Risk Interventions ? ?  12/16/2018  ?  2:10 PM  ?Readmission Risk Prevention Plan  ?Transportation Screening Complete  ?PCP or Specialist Appt within 5-7 Days Complete  ?Home Care Screening Complete  ?Medication Review (RN CM) Complete  ? ? ? ? ? ?

## 2021-08-20 NOTE — Discharge Summary (Signed)
PatientPhysician Discharge Summary  ?[name] OTL:[id number] DOB: June 04, 1950 DOA: 08/18/2021 ? ?PCP: Jon Hatchet, FNP ? ?Admit date: 08/18/2021 ?Discharge date: 08/20/2021 ?30 Day Unplanned Readmission Risk Score   ? ?Flowsheet Row ED to Hosp-Admission (Current) from 08/18/2021 in [place]  ?30 Day Unplanned Readmission Risk Score (%) 32.46 Filed at 08/20/2021 1200  ? ?  ? ? This score is the patient's risk of an unplanned readmission within 30 days of being discharged (0 -100%). The score is based on dignosis, age, lab data, medications, orders, and past utilization.   ?Low:  0-14.9   Medium: 15-21.9   High: 22-29.9   Extreme: 30 and above ? ?  ? ?  ? ? ? ?Admitted From: Home ?Disposition: Home ? ?Recommendations for Outpatient Follow-up:  ?Follow up with PCP in 1-2 weeks ?Please obtain BMP/CBC in one week ?Please follow up with your PCP on the following pending results: ?Unresulted Labs (From admission, onward)  ? ?  Start     Ordered  ? 08/18/21 2339  Legionella Pneumophila Serogp 1 Ur Ag  (COPD / Pneumonia / Cellulitis / Lower Extremity Wound)  Once,   R       ? 08/18/21 2343  ? 08/18/21 2339  Expectorated Sputum Assessment w Jon Stain, Rflx to Resp Cult  (COPD / Pneumonia / Cellulitis / Lower Extremity Wound)  Once,   R       ? 08/18/21 2343  ? 08/18/21 2128  Ammonia  Add-on,   AD       ? 08/18/21 2127  ? 08/18/21 2008  Lactic acid, plasma  Now then every 2 hours,   R (with STAT occurrences)     ? 08/18/21 2007  ? Signed and Held  Hepatitis B surface antigen  (New Admission Hemo Labs (Hepatitis B))  Once,   R       ? Signed and Held  ? Signed and Held  Hepatitis B surface antibody  (New Admission Hemo Labs (Hepatitis B))  Once,   R       ? Signed and Held  ? Signed and Held  Hepatitis B surface antibody,quantitative  (New Admission Hemo Labs (Hepatitis B))  Once,   R       ? Signed and Held  ? ?  ?  ? ?  ?  ? ? ?Home Health: Yes ?Equipment/Devices: None ? ?Discharge  Condition: Stable ?CODE STATUS: Full code ?Diet recommendation: Renal ? ?Subjective: Seen and examined earlier in the dialysis unit.  He was feeling much better.  No complaints.  No shortness of breath.  He was fully alert and oriented.  He wanted to go home.  I discussed with him about PT recommendations.  Details below. ? ?Brief/Interim Summary: [name] is a 71 y.o. male with medical history significant for ESRD on HD (per wife, dialyzes at home Sun/Wed/Fri), CAD s/p CABG, paroxysmal atrial fibrillation/flutter s/p ablation (not on anticoagulation or antiplatelets due to history of severe hematuria and GI bleed), insulin-dependent T2DM, HTN, HLD, hypothyroidism, anemia of CKD, prostate cancer s/p TURP, osteomyelitis of the left foot s/p BKA who presented to the ED for evaluation of fevers, cough, encephalopathy.  Wife had noticed that he has intermittent jerking movement of his extremities which is new.  He is nonambulatory while awaiting prosthesis for his left lower extremity.  ?  ?Initial vitals showed BP 190/66, pulse 91, RR 17, temp 101.1 ?F, SPO2 95% on room air.  SPO2 dropped to 88% while on room air and patient was placed on 2 L O2 via [place] with improvement to 97%.  Influenza and flu negative. 2 view chest x-ray showed patchy left basilar opacity. Patient was given 500 cc normal saline, IV ceftriaxone and azithromycin, IV vancomycin. . ?  ?Acute hypoxic respiratory failure community acquired pneumonia of left lower lobe of lung: Reportedly, he was briefly hypoxic in the ED required 2 L but improved.  He has remained off of oxygen since yesterday.  Currently has no symptoms.  Urine streptococcal negative.  All cultures negative.  Patient is demanding discharge.  I am discharging him on cefdinir, renally dosed. ?  ?Acute metabolic encephalopathy: Improved.  Fully alert and oriented.  Was likely secondary to pneumonia. ?  ?ESRD on hemodialysis ([place]) ?Per wife, follows with nephrology, Dr. Hollie [name], and  dialyzes at home usually on Sun/Wed/Fri.  Completed last HD 5/12.  Nephrology consulted. ?  ?Action induced myoclonus: myoclonic activity noted in right upper extremity yesterday has resolved now. ?  ?Paroxysmal atrial fibrillation ([place]) ?Atrial flutter s/p ablation ?Remains in atrial fibrillation with controlled rate on admission.  Per prior cardiology documentation he is not on anticoagulation or antiplatelets due to history of severe hematuria and GI bleeding. ?-Continue Coreg 12.5 mg twice daily ?  ?CAD (coronary artery disease) ?S/p CABG.  Denies any chest pain.  Continue Coreg and rosuvastatin.  Not on antiplatelets due to bleeding history. ?  ?Insulin dependent type 2 diabetes mellitus ([name]): Hemoglobin A1c 5.53 months ago.  Continue SSI. ?  ?Hypertension associated with diabetes ([place]): Well-controlled. ?Continue home losartan and Coreg. ?  ?Chronic thrombocytopenia ([place]) ?Mild without obvious bleeding.  Continue to monitor. ?  ?Anemia of chronic renal failure/chronic disease ?Hemoglobin stable, continue to monitor. ?  ?Hypothyroidism ?Continue Synthroid. ?  ?Hyperlipidemia associated with type 2 diabetes mellitus ([place]) ?Continue rosuvastatin. ?  ?Deconditioning/generalized weakness: Assessed by PT OT yesterday.  They recommended SNF.  Patient is now fully alert and oriented.  Discussed with him about PT recommendations.  Patient believes that he has improved significantly compared to yesterday and he does not think that he will need to go to rehabilitation.  However after significant counseling, he agreed to be reevaluated by PT after he finishes his dialysis.  But when he arrived to the floor, I received a message from the nurses that "This patient is requesting to be discharged due to inaccessibility to an appropriate bathroom".  He was also not willing to wait until seen by PT 1 more time.  Since patient is alert and oriented, he has full capacity and the right to make the decision for him self.  He is  being discharged per his request. ? ?Discharge plan was discussed with patient and/or family member and they verbalized understanding and agreed with it.  ?Discharge Diagnoses:  ?Principal Problem: ?  Community acquired pneumonia of left lower lobe of lung ?Active Problems: ?  ESRD on hemodialysis ([place]) ?  Acute metabolic encephalopathy ?  Paroxysmal atrial fibrillation (HCC) ?  Action induced myoclonus ?  CAD (coronary artery disease) ?  Insulin dependent type 2 diabetes mellitus ([place]) ?  Hypertension associated with diabetes ([place]) ?  Hyperlipidemia associated with type 2 diabetes mellitus ([place]) ?  Hypothyroidism ?  Anemia of chronic renal failure ?  Thrombocytopenia ([place]) ? ? ? ?Discharge Instructions ? ? ?Allergies as of 08/20/2021   ? ?   Reactions  ? Mushroom Extract Complex Nausea Only  ? ?  ? ?  ?  Medication List  ?  ? ?TAKE these medications   ? ?acetaminophen 500 MG tablet ?Commonly known as: TYLENOL ?Take 1,000 mg by mouth every 8 (eight) hours as needed for mild pain or headache. ?  ?allopurinol 100 MG tablet ?Commonly known as: ZYLOPRIM ?Take 2 tablets (200 mg total) by mouth daily. ?  ?calcitRIOL 0.5 MCG capsule ?Commonly known as: ROCALTROL ?Take 1 capsule (0.5 mcg total) by mouth daily. ?  ?carvedilol 25 MG tablet ?Commonly known as: COREG ?Take 1 tablet (25 mg total) by mouth 2 (two) times daily with a meal. ?What changed: how much to take ?  ?cefdinir 300 MG capsule ?Commonly known as: OMNICEF ?Take 1 capsule (300 mg total) by mouth every other day for 4 doses. Please take it on HD days, after dialysis, starting today 08/20/21 ?  ?Darbepoetin Alfa 100 MCG/0.5ML Sosy injection ?Commonly known as: ARANESP ?Inject 0.5 mLs (100 mcg total) into the vein every Thursday with hemodialysis. ?  ?DIALYVITE 800 WITH ZINC 0.8 MG Tabs ?Take 1 tablet by mouth daily. ?  ?famotidine-calcium carbonate-magnesium hydroxide 10-800-165 MG chewable tablet ?Commonly known as: PEPCID COMPLETE ?Chew 1 tablet by mouth daily as  needed (heartburn). ?  ?levothyroxine 112 MCG tablet ?Commonly known as: SYNTHROID ?Take 1 tablet (112 mcg total) by mouth daily before breakfast. ?  ?losartan 100 MG tablet ?Commonly known as: COZAAR ?Take 1

## 2021-08-20 NOTE — Progress Notes (Signed)
Physical Therapy Treatment ?Patient Details ?Name: [name] ?MRN: [id number] ?DOB: 1950/09/18 ?Today's Date: 08/20/2021 ? ? ?History of Present Illness The pt is a 71 yo male presenting 5/13 with AMS x1 week, cough x 3 days, and jaundice. Pt with recent L BKA (05/09/21) and is awaiting prosthetic. Work up revealed CAP without hypoxia, and acute metabolic encephalopathy. PMH includes: ESRD on HD Sun/W/F, afib s/p ablation, DM II, HTN, HLD, hypothyroidism, anemia, and prostate cancer s/p TUPP. ? ?  ?PT Comments  ? ? Pt with improvement noted in cognition and mobility, seemingly close to his functional baseline. Pt requiring min guard-min assist for bed mobility and transfers to and from wheelchair via lateral scoot. Pt propelling w/c with BUE's x 60 ft at a supervision level. Pt wife available to provide necessary assist. Updated d/c plan in light of pt progress.  ?   ?Recommendations for follow up therapy are one component of a multi-disciplinary discharge planning process, led by the attending physician.  Recommendations may be updated based on patient status, additional functional criteria and insurance authorization. ? ?Follow Up Recommendations ? No PT follow up (defer follow up to when pt receives prosthetic) ?  ?  ?Assistance Recommended at Discharge Intermittent Supervision/Assistance  ?Patient can return home with the following Assistance with cooking/housework;Assist for transportation;Help with stairs or ramp for entrance;A little help with walking and/or transfers;A little help with bathing/dressing/bathroom ?  ?Equipment Recommendations ? None recommended by PT  ?  ?Recommendations for Other Services   ? ? ?  ?Precautions / Restrictions Precautions ?Precautions: Fall ?Precaution Comments: L BKA, does not have prosthetic ?Restrictions ?Weight Bearing Restrictions: No  ?  ? ?Mobility ? Bed Mobility ?Overal bed mobility: Needs Assistance ?Bed Mobility: Supine to Sit, Sit to Supine ?  ?  ?Supine to sit: Min  assist, Supervision ?Sit to supine: Supervision ?  ?General bed mobility comments: Supervision for exiting left side of bed with HOB elevated, minA for exiting right side of bed with HOB flat. No physical assist to return to supine positioning ?  ? ?Transfers ?Overall transfer level: Needs assistance ?Equipment used: None ?Transfers: Bed to chair/wheelchair/BSC ?  ?  ?  ?  ?  ? Lateral/Scoot Transfers: Min guard, Min assist ?General transfer comment: Pt requiring min guard assist for lateral scoot transfer towards right from bed > w/c. Requiring min guard-minA for transferring from bed > w/c towards left, pt turning and getting into bed via quadruped then to supine ?  ? ?Ambulation/Gait ?  ?  ?  ?  ?  ?  ?  ?General Gait Details: unable ? ? ?Stairs ?  ?  ?  ?  ?  ? ? ?Wheelchair Mobility ?Wheelchair Mobility ?Wheelchair mobility: Yes ?Wheelchair propulsion: Both upper extremities ?Wheelchair parts: Supervision/cueing ?Distance: 60 ?Wheelchair Assistance Details (indicate cue type and reason): Pt propelling with BUE's, cues for pushing from farther back for increased efficiency ? ?Modified Rankin (Stroke Patients Only) ?  ? ? ?  ?Balance Overall balance assessment: Needs assistance ?Sitting-balance support: Feet supported ?Sitting balance-Leahy Scale: Good ?  ?  ?  ?  ?Standing balance comment: unable to attempt ?  ?  ?  ?  ?  ?  ?  ?  ?  ?  ?  ?  ? ?  ?Cognition Arousal/Alertness: Awake/alert ?Behavior During Therapy: [facility] for tasks assessed/performed ?Overall Cognitive Status: Impaired/Different from baseline ?Area of Impairment: Problem solving ?  ?  ?  ?  ?  ?  ?  ?  ?  ?  ?  ?  ?  ?  ?  Problem Solving: Slow processing, Requires verbal cues ?General Comments: Intermittent delayed processing with response time and difficulty problem solving with new tasks, but much improved from yesterday. ?  ?  ? ?  ?Exercises   ? ?  ?General Comments   ?  ?  ? ?Pertinent Vitals/Pain Pain Assessment ?Pain Assessment: Faces ?Faces  Pain Scale: No hurt  ? ? ?Home Living   ?  ?  ?  ?  ?  ?  ?  ?  ?  ?   ?  ?Prior Function    ?  ?  ?   ? ?PT Goals (current goals can now be found in the care plan section) Acute Rehab PT Goals ?Patient Stated Goal: return home ?PT Goal Formulation: With patient ?Time For Goal Achievement: 09/02/21 ?Potential to Achieve Goals: Good ?Progress towards PT goals: Progressing toward goals ? ?  ?Frequency ? ? ? Min 3X/week ? ? ? ?  ?PT Plan Discharge plan needs to be updated;Frequency needs to be updated  ? ? ?Co-evaluation   ?  ?  ?  ?  ? ?  ?AM-PAC PT "6 Clicks" Mobility   ?Outcome Measure ? Help needed turning from your back to your side while in a flat bed without using bedrails?: A Little ?Help needed moving from lying on your back to sitting on the side of a flat bed without using bedrails?: A Little ?Help needed moving to and from a bed to a chair (including a wheelchair)?: A Little ?Help needed standing up from a chair using your arms (e.g., wheelchair or bedside chair)?: A Lot ?Help needed to walk in hospital room?: Total ?Help needed climbing 3-5 steps with a railing? : Total ?6 Click Score: 13 ? ?  ?End of Session   ?Activity Tolerance: Patient tolerated treatment well ?Patient left: in bed;with call bell/phone within reach ?Nurse Communication: Mobility status ?PT Visit Diagnosis: Unsteadiness on feet (R26.81);Other abnormalities of gait and mobility (R26.89);Muscle weakness (generalized) (M62.81) ?  ? ? ?Time: 5465-6812 ?PT Time Calculation (min) (ACUTE ONLY): 26 min ? ?Charges:  $Therapeutic Activity: 23-37 mins          ?          ? ?Wyona Almas, PT, DPT ?Acute Rehabilitation Services ?Pager [phone] ?Office [phone] ? ? ? ?Carloine Margo Aye ?08/20/2021, 2:45 PM ? ?

## 2021-08-20 NOTE — Progress Notes (Signed)
?  08/20/21 1210  ?Vitals  ?Temp 97.9 ?F (36.6 ?C)  ?Temp Source Oral  ?BP (!) 149/64  ?BP Location Right Arm  ?BP Method Automatic  ?Patient Position (if appropriate) Lying  ?Pulse Rate 68  ?Pulse Rate Source Monitor  ?Resp 18  ?Oxygen Therapy  ?SpO2 98 %  ?O2 Device Room Air  ?Post-Hemodialysis Assessment  ?Rinseback Volume (mL) 250 mL  ?KECN 242 V  ?Dialyzer Clearance Lightly streaked  ?Duration of HD Treatment -hour(s) 3 hour(s)  ?Hemodialysis Intake (mL) 500 mL  ?UF Total -Machine (mL) 2500 mL  ?Net UF (mL) 2000 mL  ?Tolerated HD Treatment Yes  ?Post-Hemodialysis Comments tx complete, pt stable  ?AVG/AVF Arterial Site Held (minutes) 7 minutes  ?AVG/AVF Venous Site Held (minutes) 7 minutes  ?Fistula / Graft Left Upper arm Arteriovenous fistula  ?No placement date or time found.   Orientation: Left  Access Location: Upper arm  Access Type: Arteriovenous fistula  ?Site Condition No complications  ?Fistula / Graft Assessment Present;Thrill;Bruit  ?Status Deaccessed  ?Needle Size  ?(button hole)  ?Drainage Description None  ? ?HD tx complete, pt stable. Goal met, no issues noted throughout tx. ?

## 2021-08-21 ENCOUNTER — Encounter: Payer: Medicare Other | Admitting: Physical Medicine and Rehabilitation

## 2021-08-21 LAB — LEGIONELLA PNEUMOPHILA SEROGP 1 UR AG: L. pneumophila Serogp 1 Ur Ag: NEGATIVE

## 2021-08-23 ENCOUNTER — Ambulatory Visit: Payer: Medicare Other | Admitting: Cardiology

## 2021-08-23 ENCOUNTER — Encounter: Payer: Self-pay | Admitting: Cardiology

## 2021-08-23 VITALS — BP 109/73 | HR 68 | Temp 98.0°F | Resp 16 | Ht 71.0 in | Wt 193.5 lb

## 2021-08-23 DIAGNOSIS — N186 End stage renal disease: Secondary | ICD-10-CM

## 2021-08-23 DIAGNOSIS — I4821 Permanent atrial fibrillation: Secondary | ICD-10-CM

## 2021-08-23 DIAGNOSIS — J189 Pneumonia, unspecified organism: Secondary | ICD-10-CM

## 2021-08-23 DIAGNOSIS — I10 Essential (primary) hypertension: Secondary | ICD-10-CM

## 2021-08-23 DIAGNOSIS — I2510 Atherosclerotic heart disease of native coronary artery without angina pectoris: Secondary | ICD-10-CM

## 2021-08-23 LAB — CULTURE, BLOOD (ROUTINE X 2)
Culture: NO GROWTH
Culture: NO GROWTH
Special Requests: ADEQUATE

## 2021-08-23 MED ORDER — GUAIFENESIN-DM 100-10 MG/5ML PO SYRP: 10.0000 mL | ORAL_SOLUTION | Freq: Three times a day (TID) | ORAL | 0 refills | Status: DC

## 2021-08-23 MED ORDER — GUAIFENESIN-CODEINE 100-10 MG/5ML PO SOLN: 10.0000 mL | Freq: Every evening | ORAL | 0 refills | Status: DC | PRN

## 2021-08-23 NOTE — Progress Notes (Signed)
Primary Physician/Referring:  Jon Hatchet, FNP  Patient ID: [name], male    DOB: [birth year], 71 y.o.   MRN: [id number]  Chief Complaint  Patient presents with   Chest Pain     HPI:    [name]  is a 71 y.o.  Caucasian male with CABG in [place] 02/18/2004: RIMA to LAD, LIMA to obtuse marginal, SVG to diagonal and SVG to PDA, history of atrial flutter ablation on 04/06/2020 but noted to have atrial fibrillation in July 2022, inability to tolerate anticoagulation due to severe hematuria and unable to tolerate antiplatelet agents due to GI bleed, diabetes mellitus, diabetic nephropathy with ESRD on HD at home, peripheral neuropathy and diabetic foot ulcer right, S/P Sim's amputation of the right foot and underwent left BKA in February 2023.  He was hospitalized on 08/18/2021 for community-acquired pneumonia of the left base.  He had chest pain at that time.  He wanted to be seen.  Chest pain symptoms clearly appear to be musculoskeletal, continuous and she still has pain when he coughs.  Past Medical History:  Diagnosis Date   Ambulates with cane    straight cane   Anemia    Cervical myelopathy (HCC) 02/06/2018   Chronic kidney disease    dailysis M W F- home   Complication of anesthesia    Coronary artery disease    Diabetes mellitus without complication ([place])    type2   Diabetic foot ulcer ([place])    Diabetic neuropathy ([place]) 02/06/2018   Gait abnormality 08/24/2018   GERD (gastroesophageal reflux disease)     01/06/20- not current   Gout    History of blood transfusion    History of blood transfusion    History of kidney stones    passed stones   Hypercholesteremia    Hypertension    Hypothyroidism    Neuromuscular disorder (HCC)    neuropathy left leg and bilateral feet   Neuropathy    PONV (postoperative nausea and vomiting)    Prostate cancer (HCC)    PVD (peripheral vascular disease) (HCC)    with amputations    Social History   Tobacco Use    Smoking status: Former    Types: Cigars    Start date: 04/08/1973    Quit date: 12/24/1988    Years since quitting: 32.6   Smokeless tobacco: Never   Tobacco comments:    Cigars and Pipe   Substance Use Topics   Alcohol use: Never    ROS  Review of Systems  Cardiovascular:  Positive for chest pain. Negative for dyspnea on exertion and leg swelling.  Respiratory:  Positive for cough.   Gastrointestinal:  Negative for melena.  Objective  Blood pressure 109/73, pulse 68, temperature 98 F (36.7 C), temperature source Temporal, resp. rate 16, height '5\' 11"'$  (1.803 m), weight 193 lb 8 oz (87.8 kg), SpO2 97 %.     08/23/2021   10:52 AM 08/20/2021   12:14 PM 08/20/2021   12:10 PM  Vitals with BMI  Height '5\' 11"'$     Weight 193 lbs 8 oz 188 lbs 4 oz   BMI 27 62.83   Systolic 662  947  Diastolic 73  64  Pulse 68  68     Physical Exam Constitutional:      Comments: Ill looking  Neck:     Thyroid: No thyromegaly.     Vascular: No carotid bruit or JVD.  Cardiovascular:     Rate and  Rhythm: Normal rate and regular rhythm.     Pulses:          Carotid pulses are 2+ on the right side and 2+ on the left side.      Femoral pulses are 2+ on the right side and 2+ on the left side.      Popliteal pulses are 2+ on the right side and 2+ on the left side.       Dorsalis pedis pulses are 1+ on the right side.       Posterior tibial pulses are 1+ on the right side.     Heart sounds: S1 normal and S2 normal. Murmur heard.  Holosystolic murmur is present with a grade of 2/6. Through out the precordium related to AV shunt left arm.      Comments: Left BKA. Small open wound noted at the stump. Thick rigid nails and onychomycosis (chronic).  Capillary refill < 3 Sec.  Left arm AV fistula for dialysis patent. Pulmonary:     Effort: Pulmonary effort is normal.     Breath sounds: Rales (left base) present.  Abdominal:     General: Bowel sounds are normal.     Palpations: Abdomen is soft.   Musculoskeletal:     Right lower leg: No edema.  Skin:    Capillary Refill: Capillary refill takes less than 2 seconds.   Laboratory examination:   Recent Labs    08/18/21 2052 08/19/21 0610 08/20/21 0144  NA 135 135 138  K 3.9 3.8 3.7  CL 93* 93* 99  CO2 '30 24 23  '$ GLUCOSE 128* 123* 110*  BUN 46* 51* 64*  CREATININE 6.95* 7.65* 8.73*  CALCIUM 9.8 9.2 8.9  GFRNONAA 8* 7* 6*   estimated creatinine clearance is 8.1 mL/min (A) (by C-G formula based on SCr of 8.73 mg/dL (H)).     Latest Ref Rng & Units 08/20/2021    1:44 AM 08/19/2021    6:10 AM 08/18/2021    8:52 PM  CMP  Glucose 70 - 99 mg/dL 110   123   128    BUN 8 - 23 mg/dL 64   51   46    Creatinine 0.61 - 1.24 mg/dL 8.73   7.65   6.95    Sodium 135 - 145 mmol/L 138   135   135    Potassium 3.5 - 5.1 mmol/L 3.7   3.8   3.9    Chloride 98 - 111 mmol/L 99   93   93    CO2 22 - 32 mmol/L '23   24   30    '$ Calcium 8.9 - 10.3 mg/dL 8.9   9.2   9.8    Total Protein 6.5 - 8.1 g/dL  6.0   6.7    Total Bilirubin 0.3 - 1.2 mg/dL  1.7   1.6    Alkaline Phos 38 - 126 U/L  69   96    AST 15 - 41 U/L  12   19    ALT 0 - 44 U/L  13   15        Latest Ref Rng & Units 08/20/2021    1:44 AM 08/19/2021    6:10 AM 08/18/2021    8:34 PM  CBC  WBC 4.0 - 10.5 K/uL 4.7   6.3   6.3    Hemoglobin 13.0 - 17.0 g/dL 9.0   9.7   11.6    Hematocrit 39.0 - 52.0 % 27.8  31.1   35.0    Platelets 150 - 400 K/uL 82   83   113     HEMOGLOBIN A1C Lab Results  Component Value Date   HGBA1C 5.5 05/04/2021   MPG 111.15 05/04/2021    Medications and allergies   Allergies  Allergen Reactions   Mushroom Extract Complex Nausea Only    Current Outpatient Medications:    acetaminophen (TYLENOL) 500 MG tablet, Take 1,000 mg by mouth every 8 (eight) hours as needed for mild pain or headache., Disp: , Rfl:    allopurinol (ZYLOPRIM) 100 MG tablet, Take 2 tablets (200 mg total) by mouth daily., Disp: 30 tablet, Rfl: 0   B Complex-C-Zn-Folic Acid  (DIALYVITE 800 WITH ZINC) 0.8 MG TABS, Take 1 tablet by mouth daily., Disp: 30 tablet, Rfl: 0   calcitRIOL (ROCALTROL) 0.5 MCG capsule, Take 1 capsule (0.5 mcg total) by mouth daily., Disp: 30 capsule, Rfl: 0   carvedilol (COREG) 25 MG tablet, Take 1 tablet (25 mg total) by mouth 2 (two) times daily with a meal. (Patient taking differently: Take 12.5 mg by mouth 2 (two) times daily with a meal.), Disp: 60 tablet, Rfl: 0   cefdinir (OMNICEF) 300 MG capsule, Take 1 capsule (300 mg total) by mouth every other day for 4 doses. Please take it on HD days, after dialysis, starting today 08/20/21, Disp: 4 capsule, Rfl: 0   Darbepoetin Alfa (ARANESP) 100 MCG/0.5ML SOSY injection, Inject 0.5 mLs (100 mcg total) into the vein every Thursday with hemodialysis., Disp: 4.2 mL, Rfl:    famotidine-calcium carbonate-magnesium hydroxide (PEPCID COMPLETE) 10-800-165 MG chewable tablet, Chew 1 tablet by mouth daily as needed (heartburn)., Disp: , Rfl:    guaiFENesin-codeine 100-10 MG/5ML syrup, Take 10 mLs by mouth at bedtime as needed for cough., Disp: 120 mL, Rfl: 0   guaiFENesin-dextromethorphan (ROBITUSSIN DM) 100-10 MG/5ML syrup, Take 10 mLs by mouth in the [name], at noon, and at bedtime., Disp: 150 mL, Rfl: 0   Insulin Pen Needle 32G X 4 MM MISC, Use to inject insulin up to 4 times daily as needed., Disp: 100 each, Rfl: 0   Insulin Pen Needle 32G X 4 MM MISC, Use to inject insulin up to 4 times daily as needed., Disp: 100 each, Rfl: 0   levothyroxine (SYNTHROID) 112 MCG tablet, Take 1 tablet (112 mcg total) by mouth daily before breakfast., Disp: 30 tablet, Rfl: 0   losartan (COZAAR) 100 MG tablet, Take 1 tablet (100 mg total) by mouth daily., Disp: 30 tablet, Rfl: 0   methocarbamol (ROBAXIN) 500 MG tablet, Take 1 tablet (500 mg total) by mouth 3 (three) times daily. (Patient taking differently: Take 500 mg by mouth every 8 (eight) hours as needed for muscle spasms.), Disp: 30 tablet, Rfl: 0   pantoprazole  (PROTONIX) 40 MG tablet, Take 1 tablet (40 mg total) by mouth daily. (Patient taking differently: Take 40 mg by mouth every evening.), Disp: 30 tablet, Rfl: 0   Polyethyl Glycol-Propyl Glycol (LUBRICANT EYE DROPS) 0.4-0.3 % SOLN, Place 1-2 drops into both eyes in the [name] and at bedtime., Disp: , Rfl:    pregabalin (LYRICA) 150 MG capsule, Take 1 capsule by mouth in the [name] and 2 capsules at night., Disp: 90 capsule, Rfl: 0   prochlorperazine (COMPAZINE) 5 MG tablet, Take 5 mg by mouth 2 (two) times daily., Disp: , Rfl:    rosuvastatin (CRESTOR) 10 MG tablet, Take 1 tablet (10 mg total) by mouth daily., Disp: 30 tablet, Rfl: 0  Semaglutide,0.25 or 0.'5MG'$ /DOS, (OZEMPIC, 0.25 OR 0.5 MG/DOSE,) 2 MG/1.5ML SOPN, Inject 0.5 mg into the skin every Friday. (Patient taking differently: Inject 0.5 mg into the skin every Saturday at 6 PM.), Disp: 1.5 mL, Rfl: 0   sevelamer carbonate (RENVELA) 800 MG tablet, Take 1 tablet (800 mg total) by mouth 3 (three) times daily with meals., Disp: 90 tablet, Rfl: 0    Radiology:   CXR 02/25/2020: Persistent small bilateral pleural effusions, slightly increased on the left.  Cardiac Studies:   Lower extremity arterial duplex 05/12/2014:  No hemodynamically significant stenoses are identified in the bilateral lower extremity arterial system.This exam reveals normal perfusion of both the lower extremities with RABI 1.04 and LABI 1.09. There is mild diffuse disease involving the small vessels below the knee. Compared to the study done on 06/08/2013, no significant change. Impression: Lower extremity venous insufficiency study 12/25/2016: No venous insufficiency or DVT in the right lower extremity. Reflexes in the left greater saphenous vein beginning in the distal thigh and involving the entire length of the calf. No obvious varicosities.  ABI 12/16/2018: Right: Resting right ankle-brachial index is within normal range. No evidence of significant right lower extremity  arterial disease. Left: Resting left ankle-brachial index indicates noncompressible left lower extremity arteries.  Carotid artery duplex  05/07/2019: Stenosis in the bilateral internal carotid artery (16-49%), lower end of spectrum with heteregenous plaque.  Antegrade right vertebral artery flow. Antegrade left vertebral artery flow. Follow up in one year is appropriate if clinically indicated. Compared to 01/27/2018, no significant change.  Echocardiogram 03/17/2020: Mildly depressed LV systolic function with visual EF 40-45%. Left ventricle cavity is normal in size. Moderate concentric hypertrophy of the left ventricle. Left ventricle regional wall motion findings: Mid inferoseptal, Apical septal and Apical cap hypokinesis. Unable to evaluate diastolic function due to atrial fibrillation. Elevated LAP.  Left atrial cavity is severely dilated. Aortic valve sclerosis without stenosis. Mild (Grade I) mitral regurgitation. Mild tricuspid regurgitation. No evidence of pulmonary hypertension. RVSP measures 33 mmHg. Mild pulmonic regurgitation. IVC is dilated with blunted respiratory response. Compared to prior study dated 06/08/2013: LVEF was 60-65% and now 40-45%, RWMA is new, LA is now severely dilated.   Lexiscan Tetrofosmin Stress Test 03/20/2020: Non-diagnostic ECG stress due to pharmacologic stress testing. Resting EKG demonstrated atrial flutter. Non-specific Twave abnormalities. Peak EKG revealed no significant ST-T change from baseline abnormality. Myocardial perfusion is normal. Mildly enlarged left ventricle. in both rest and stress. LV stress volume 170 ml. TID is normal. No stress lung uptake. Overall LV systolic function is low normal without regional wall motion  abnormalities. Stress LV EF: 51%. Compared to 05/28/2013, no significant change. LV dilatation is new.  EKG  EKG 07/24/2021: Atrial fibrillation with controlled ventricular sponsor at rate of 60 bpm, normal axis,  incomplete right bundle branch block.  Nonspecific T abnormality.  No significant change from 05/04/2021.  Assessment     ICD-10-CM   1. Chest pain of uncertain etiology  D63.8 EKG 12-Lead    2. Coronary artery disease involving native coronary artery of native heart without angina pectoris  I25.10     3. Essential hypertension  I10     4. ESRD (end stage renal disease) ([employer])  N18.6     5. Permanent atrial fibrillation (HCC)  I48.21      CHA2DS2-VASc Score is 4.  Yearly risk of stroke: 4.8% (A, HTN, DM, Vasc Dz).  Score of 1=0.6; 2=2.2; 3=3.2; 4=4.8; 5=7.2; 6=9.8; 7=>9.8) -(CHF; HTN; vasc disease DM,  Male = 1; Age <65 =0; 65-74 = 1,  >75 =2; stroke/embolism= 2).    No orders of the defined types were placed in this encounter.    There are no discontinued medications.    Recommendations:   [name]  is a 71 y.o. Caucasian male with CABG in [place] 02/18/2004: RIMA to LAD, LIMA to obtuse marginal, SVG to diagonal and SVG to PDA, history of atrial flutter ablation on 04/06/2020 but noted to have atrial fibrillation in July 2022, inability to tolerate anticoagulation due to severe hematuria and unable to tolerate antiplatelet agents due to GI bleed, diabetes mellitus, diabetic nephropathy with ESRD on HD at home, peripheral neuropathy and diabetic foot ulcer right, S/P Sim's amputation of the right foot and underwent left BKA in February 2023.  He was hospitalized on 08/18/2021 for community-acquired pneumonia of the left base.  He had chest pain at that time.  He wanted to be seen.  Chest pain symptoms clearly appear to be musculoskeletal, continuous and she still has pain when he coughs.  Still has crackles in his left base, as he has cough that is disturbing sleep, Robitussin with codeine Rx sent.  I also gave him a prescription for expectorant and advised him to inhale steam at least twice a day.  He has left below-knee amputation stump has nearly healed, has a very small open  wound without any significant discharge.  He is being fitted with a prosthesis soon in the next 3 to 4 weeks once the wound heals.     He performs home dialysis, accompanied by his wife.  No change in his physical exam and his EKG.  He is in permanent atrial fibrillation. There is no clinical evidence of heart failure.  He has not had any angina pectoris.  I will see him back in 6 months for follow-up.  Adrian Prows, MD, [facility] 08/23/2021, 11:37 AM Office: [phone] Pager: [phone]

## 2021-09-03 ENCOUNTER — Encounter: Payer: Self-pay | Admitting: Orthopedic Surgery

## 2021-09-03 NOTE — Progress Notes (Signed)
Office Visit Note   Patient: [name]           Date of Birth: 12-06-1950           MRN: [id number] Visit Date: 08/06/2021              Requested by: Lorenda Hatchet, FNP [id number] [place],  [place] [address] PCP: Lorenda Hatchet, FNP  Chief Complaint  Patient presents with   Left Leg - Routine Post Op    06/27/21 left BKA revision      HPI: Patient is a 71 year old gentleman who is status post revision left transtibial amputation on March 22.  Patient is currently using a stump shrinker.  He is in a 2 Paramedic.  Assessment & Plan: Visit Diagnoses:  1. Below-knee amputation of left lower extremity ([place])     Plan: Recommended decreasing his shrinker to an Paramedic.  Patient was provided a prescription for biotech for a prosthesis.  Follow-Up Instructions: Return in about 4 weeks (around 09/03/2021).   Ortho Exam  Patient is alert, oriented, no adenopathy, well-dressed, normal affect, normal respiratory effort. Examination the incision is healing well there is no redness no cellulitis no drainage.  Patient does have a scab over the surgical incision.  Patient is a new left transtibial  amputee.  Patient's current comorbidities are not expected to impact the ability to function with the prescribed prosthesis. Patient verbally communicates a strong desire to use a prosthesis. Patient currently requires mobility aids to ambulate without a prosthesis.  Expects not to use mobility aids with a new prosthesis.  Patient is a K3 level ambulator that spends a lot of time walking around on uneven terrain over obstacles, up and down stairs, and ambulates with a variable cadence.     Imaging: No results found.   Labs: Lab Results  Component Value Date   HGBA1C 5.5 05/04/2021   HGBA1C 6.3 (H) 02/02/2021   HGBA1C 5.4 09/13/2020   ESRSEDRATE 34 (H) 01/24/2020   ESRSEDRATE 41 (H) 01/10/2020   ESRSEDRATE 55 (H) 12/27/2019   CRP  8.8 (H) 01/24/2020   CRP 35.9 (H) 01/10/2020   CRP 77.9 (H) 12/27/2019   REPTSTATUS 08/23/2021 FINAL 08/18/2021   GRAMSTAIN  06/27/2021    RARE WBC PRESENT, PREDOMINANTLY MONONUCLEAR NO ORGANISMS SEEN    CULT  08/18/2021    NO GROWTH 5 DAYS Performed at [facility], [place] [address], [place], [place] [address]    LABORGA PSEUDOMONAS AERUGINOSA 06/27/2021     Lab Results  Component Value Date   ALBUMIN 3.3 (L) 08/20/2021   ALBUMIN 3.7 08/19/2021   ALBUMIN 4.0 08/18/2021   PREALBUMIN 32.7 05/09/2021   PREALBUMIN 14.8 (L) 12/14/2018    Lab Results  Component Value Date   MG 2.4 05/14/2021   MG 1.9 05/09/2021   MG 2.0 06/28/2020   Lab Results  Component Value Date   VD25OH 16.13 (L) 05/09/2021    Lab Results  Component Value Date   PREALBUMIN 32.7 05/09/2021   PREALBUMIN 14.8 (L) 12/14/2018      Latest Ref Rng & Units 08/20/2021    1:44 AM 08/19/2021    6:10 AM 08/18/2021    8:34 PM  CBC EXTENDED  WBC 4.0 - 10.5 K/uL 4.7   6.3   6.3    RBC 4.22 - 5.81 MIL/uL 3.18   3.45   4.01    Hemoglobin 13.0 - 17.0 g/dL 9.0  9.7   11.6    HCT 39.0 - 52.0 % 27.8   31.1   35.0    Platelets 150 - 400 K/uL 82   83   113    NEUT# 1.7 - 7.7 K/uL   5.0    Lymph# 0.7 - 4.0 K/uL   0.3       There is no height or weight on file to calculate BMI.  Orders:  No orders of the defined types were placed in this encounter.  No orders of the defined types were placed in this encounter.    Procedures: No procedures performed  Clinical Data: No additional findings.  ROS:  All other systems negative, except as noted in the HPI. Review of Systems  Objective: Vital Signs: There were no vitals taken for this visit.  Specialty Comments:  No specialty comments available.  PMFS History: Patient Active Problem List   Diagnosis Date Noted   Community acquired pneumonia of left lower lobe of lung 08/18/2021   Paroxysmal atrial fibrillation ([place]) 08/18/2021   Anemia of  chronic renal failure 08/18/2021   Thrombocytopenia ([place]) 20/94/7096   Acute metabolic encephalopathy 28/36/6294   Action induced myoclonus 08/18/2021   Dehiscence of amputation stump of left lower extremity ([place])    Left below-knee amputee ([place]) 05/11/2021   S/P BKA (below knee amputation) unilateral, left ([place]) 05/09/2021   Partial nontraumatic amputation of foot, left ([place]) 02/20/2021   Abscess of bursa of left foot 02/03/2021   Cutaneous abscess of left foot    Acute osteomyelitis of metatarsal bone of left foot (HCC)    Symptomatic anemia 09/13/2020   COVID-19 virus infection 09/13/2020   Pressure injury of skin 06/29/2020   Gross hematuria 06/16/2020   Typical atrial flutter ([place]) 03/24/2020   Bilateral pleural effusion 02/24/2020   Healthcare maintenance 01/28/2020   Shortness of breath 01/28/2020   History of partial ray amputation of fourth toe of right foot ([place]) 09/08/2019   Ulcerated, foot, right, with necrosis of bone (HCC)    Chronic cough 06/25/2019   Cutaneous abscess of right foot    Subacute osteomyelitis of right foot ([place]) 12/14/2018   AKI (acute kidney injury) ([place]) 12/14/2018   ESRD on hemodialysis ([place]) 12/14/2018   Gait abnormality 08/24/2018   Diabetic neuropathy ([place]) 02/06/2018   Cervical myelopathy ([place]) 02/06/2018   Onychomycosis 10/30/2017   Other spondylosis with radiculopathy, cervical region 01/27/2017   Midfoot ulcer, right, limited to breakdown of skin ([place]) 11/15/2016   Lateral epicondylitis, left elbow 08/12/2016   Cellulitis of fifth toe of right foot 07/29/2016   Prostate cancer ([place]) 06/19/2016   Non-pressure chronic ulcer of other part of right foot limited to breakdown of skin ([place]) 04/04/2016   Cellulitis of leg, right 12/24/2015   Cellulitis of right lower extremity 12/24/2015   Gout 09/14/2012   Hypertension associated with diabetes ([place]) 09/14/2012   Hypothyroidism 09/14/2012   CAD (coronary artery disease) 09/14/2012   Insulin  dependent type 2 diabetes mellitus ([place]) 09/14/2012   Hyperlipidemia associated with type 2 diabetes mellitus ([place])    Neuropathy (HCC)    Past Medical History:  Diagnosis Date   Ambulates with cane    straight cane   Anemia    Cervical myelopathy ([place]) 02/06/2018   Chronic kidney disease    dailysis M W F- home   Complication of anesthesia    Coronary artery disease    Diabetes mellitus without complication ([place])    type2   Diabetic foot  ulcer ([place])    Diabetic neuropathy ([place]) 02/06/2018   Gait abnormality 08/24/2018   GERD (gastroesophageal reflux disease)     01/06/20- not current   Gout    History of blood transfusion    History of blood transfusion    History of kidney stones    passed stones   Hypercholesteremia    Hypertension    Hypothyroidism    Neuromuscular disorder (HCC)    neuropathy left leg and bilateral feet   Neuropathy    PONV (postoperative nausea and vomiting)    Prostate cancer (HCC)    PVD (peripheral vascular disease) (HCC)    with amputations    Family History  Problem Relation Age of Onset   Diabetes Mellitus II Mother    Kidney disease Mother    Diabetes Mellitus II Father    CAD Father    Cancer Father        prostate   Kidney disease Father    Diabetes Mellitus II Brother    Kidney disease Brother    Diabetes Mellitus II Brother    Stomach cancer Brother 53   Kidney disease Brother    Colon cancer Neg Hx    Colon polyps Neg Hx    Esophageal cancer Neg Hx    Rectal cancer Neg Hx    Pancreatic cancer Neg Hx     Past Surgical History:  Procedure Laterality Date   A-FLUTTER ABLATION N/A 04/06/2020   Procedure: A-FLUTTER ABLATION;  Surgeon: [name], MD;  Location: [facility];  Service: Cardiovascular;  Laterality: N/A;   AMPUTATION Left 12/25/2013   Procedure: AMPUTATION RAY LEFT 5TH RAY;  Surgeon: [name], MD;  [facility];  Service: Orthopedics;  Laterality: Left;   AMPUTATION Right 12/15/2018    Procedure: AMPUTATION OF 4TH AND 5TH TOES RIGHT FOOT;  Surgeon: [name], MD;  Location: [facility];  Service: Orthopedics;  Laterality: Right;   AMPUTATION Left 02/02/2021   Procedure: LEFT FOOT 4TH RAY AMPUTATION;  Surgeon: [name], MD;  Location: [facility];  Service: Orthopedics;  Laterality: Left;   AMPUTATION Left 05/09/2021   Procedure: LEFT BELOW KNEE AMPUTATION;  Surgeon: [name], MD;  Location: [facility];  Service: Orthopedics;  Laterality: Left;   APPLICATION OF WOUND VAC Right 12/15/2018   Procedure: APPLICATION OF WOUND VAC;  Surgeon: [name], MD;  Location: [facility];  Service: Orthopedics;  Laterality: Right;   AV FISTULA PLACEMENT Left 03/25/2019   Procedure: LEFT ARM ARTERIOVENOUS (AV) FISTULA CREATION;  Surgeon: [name], MD;  Location: [facility];  Service: Vascular;  Laterality: Left;   [place] Left 05/20/2019   Procedure: SECOND STAGE LEFT BASCILIC VEIN TRANSPOSITION;  Surgeon: [name], MD;  Location: [facility];  Service: Vascular;  Laterality: Left;   CARDIAC CATHETERIZATION  02/17/2014   CHOLECYSTECTOMY     COLONOSCOPY  2011   in [place], [place]  2008   CYSTOSCOPY N/A 06/29/2020   Procedure: [place];  Surgeon: [name], MD;  Location: [facility];  Service: Urology;  Laterality: N/A;   CYSTOSCOPY WITH FULGERATION Bilateral 06/17/2020   Procedure: CYSTOSCOPY,BILATERAL RETROGRADE, CLOT EVACUATION WITH FULGERATION OF THE BLADDER;  Surgeon: Janith Lima, MD;  Location: [facility];  Service: Urology;  Laterality: Bilateral;   CYSTOSCOPY WITH FULGERATION N/A 06/28/2020   Procedure: CYSTOSCOPY WITH CLOT  EVACUATION AND FULGERATION OF BLEEDERS;  Surgeon: [name], MD;  Location: [facility];  Service: Urology;  Laterality: N/A;  1 HR   I & D EXTREMITY Right 12/15/2018   Procedure: DEBRIDEMENT RIGHT FOOT;  Surgeon: [name], MD;  Location: [facility];   Service: Orthopedics;  Laterality: Right;   I & D EXTREMITY Right 08/27/2019   Procedure: PARTIAL CUBOID EXCISION RIGHT FOOT;  Surgeon: [name], MD;  Location: [facility];  Service: Orthopedics;  Laterality: Right;   I & D EXTREMITY Right 01/07/2020   Procedure: RIGHT FOOT EXCISION INFECTED BONE;  Surgeon: [name], MD;  Location: [facility];  Service: Orthopedics;  Laterality: Right;   NECK SURGERY     novemver 2019   STUMP REVISION Left 06/27/2021   Procedure: REVISION LEFT BELOW KNEE AMPUTATION;  Surgeon: [name], MD;  Location: [facility];  Service: Orthopedics;  Laterality: Left;   TRANSURETHRAL RESECTION OF PROSTATE N/A 06/28/2020   Procedure: TRANSURETHRAL RESECTION OF THE PROSTATE (TURP);  Surgeon: [name], MD;  Location: [facility];  Service: Urology;  Laterality: N/A;   WISDOM TOOTH EXTRACTION     Social History   Occupational History   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  Tobacco Use   Smoking status: Former    Types: Cigars    Start date: 04/08/1973    Quit date: 12/24/1988    Years since quitting: 32.7   Smokeless tobacco: Never   Tobacco comments:    Cigars and Pipe   Vaping Use   Vaping Use: Never used  Substance and Sexual Activity   Alcohol use: Never   Drug use: Never   Sexual activity: Yes    Partners: Female

## 2021-09-06 ENCOUNTER — Ambulatory Visit (INDEPENDENT_AMBULATORY_CARE_PROVIDER_SITE_OTHER): Payer: Medicare Other | Admitting: Orthopedic Surgery

## 2021-09-06 ENCOUNTER — Other Ambulatory Visit: Payer: Self-pay | Admitting: Internal Medicine

## 2021-09-06 DIAGNOSIS — S88112A Complete traumatic amputation at level between knee and ankle, left lower leg, initial encounter: Secondary | ICD-10-CM

## 2021-09-06 DIAGNOSIS — Z89512 Acquired absence of left leg below knee: Secondary | ICD-10-CM

## 2021-09-07 ENCOUNTER — Encounter: Payer: Self-pay | Admitting: Orthopedic Surgery

## 2021-09-07 NOTE — Progress Notes (Signed)
Office Visit Note   Patient: [name]           Date of Birth: January 29, 1951           MRN: [id number] Visit Date: 09/06/2021              Requested by: Lorenda Hatchet, FNP [address] [place],  [place] [address] PCP: Lorenda Hatchet, FNP  Chief Complaint  Patient presents with   Left Leg - Routine Post Op    06/27/2021 revision left BKA       HPI: Patient is a 71 year old gentleman is 10 weeks status post revision left transtibial amputation.  Patient's casting for his prosthesis is pending resolution of the scab.  Assessment & Plan: Visit Diagnoses:  1. Below-knee amputation of left lower extremity ([place])     Plan: Anticipate patient will be cast in several weeks.  We will set up for outpatient physical therapy.  Follow-Up Instructions: Return in about 4 weeks (around 10/04/2021).   Ortho Exam  Patient is alert, oriented, no adenopathy, well-dressed, normal affect, normal respiratory effort. Examination the incision is well-healed there is no drainage no cellulitis no tenderness to palpation.  There is a small scab 5 x 20 mm.  Imaging: No results found. No images are attached to the encounter.  Labs: Lab Results  Component Value Date   HGBA1C 5.5 05/04/2021   HGBA1C 6.3 (H) 02/02/2021   HGBA1C 5.4 09/13/2020   ESRSEDRATE 34 (H) 01/24/2020   ESRSEDRATE 41 (H) 01/10/2020   ESRSEDRATE 55 (H) 12/27/2019   CRP 8.8 (H) 01/24/2020   CRP 35.9 (H) 01/10/2020   CRP 77.9 (H) 12/27/2019   REPTSTATUS 08/23/2021 FINAL 08/18/2021   GRAMSTAIN  06/27/2021    RARE WBC PRESENT, PREDOMINANTLY MONONUCLEAR NO ORGANISMS SEEN    CULT  08/18/2021    NO GROWTH 5 DAYS Performed at [facility], [place] [address] [place], [place] [address]    LABORGA PSEUDOMONAS AERUGINOSA 06/27/2021     Lab Results  Component Value Date   ALBUMIN 3.3 (L) 08/20/2021   ALBUMIN 3.7 08/19/2021   ALBUMIN 4.0 08/18/2021   PREALBUMIN 32.7 05/09/2021   PREALBUMIN 14.8 (L)  12/14/2018    Lab Results  Component Value Date   MG 2.4 05/14/2021   MG 1.9 05/09/2021   MG 2.0 06/28/2020   Lab Results  Component Value Date   VD25OH 16.13 (L) 05/09/2021    Lab Results  Component Value Date   PREALBUMIN 32.7 05/09/2021   PREALBUMIN 14.8 (L) 12/14/2018      Latest Ref Rng & Units 08/20/2021    1:44 AM 08/19/2021    6:10 AM 08/18/2021    8:34 PM  CBC EXTENDED  WBC 4.0 - 10.5 K/uL 4.7   6.3   6.3    RBC 4.22 - 5.81 MIL/uL 3.18   3.45   4.01    Hemoglobin 13.0 - 17.0 g/dL 9.0   9.7   11.6    HCT 39.0 - 52.0 % 27.8   31.1   35.0    Platelets 150 - 400 K/uL 82   83   113    NEUT# 1.7 - 7.7 K/uL   5.0    Lymph# 0.7 - 4.0 K/uL   0.3       There is no height or weight on file to calculate BMI.  Orders:  No orders of the defined types were placed in this encounter.  No orders  of the defined types were placed in this encounter.    Procedures: No procedures performed  Clinical Data: No additional findings.  ROS:  All other systems negative, except as noted in the HPI. Review of Systems  Objective: Vital Signs: There were no vitals taken for this visit.  Specialty Comments:  No specialty comments available.  PMFS History: Patient Active Problem List   Diagnosis Date Noted   Community acquired pneumonia of left lower lobe of lung 08/18/2021   Paroxysmal atrial fibrillation ([place]) 08/18/2021   Anemia of chronic renal failure 08/18/2021   Thrombocytopenia ([place]) 25/42/7062   Acute metabolic encephalopathy 37/62/8315   Action induced myoclonus 08/18/2021   Dehiscence of amputation stump of left lower extremity ([place])    Left below-knee amputee ([place]) 05/11/2021   S/P BKA (below knee amputation) unilateral, left ([place]) 05/09/2021   Partial nontraumatic amputation of foot, left ([place]) 02/20/2021   Abscess of bursa of left foot 02/03/2021   Cutaneous abscess of left foot    Acute osteomyelitis of metatarsal bone of left foot (HCC)    Symptomatic  anemia 09/13/2020   COVID-19 virus infection 09/13/2020   Pressure injury of skin 06/29/2020   Gross hematuria 06/16/2020   Typical atrial flutter ([place]) 03/24/2020   Bilateral pleural effusion 02/24/2020   Healthcare maintenance 01/28/2020   Shortness of breath 01/28/2020   History of partial ray amputation of fourth toe of right foot ([place]) 09/08/2019   Ulcerated, foot, right, with necrosis of bone (HCC)    Chronic cough 06/25/2019   Cutaneous abscess of right foot    Subacute osteomyelitis of right foot ([place]) 12/14/2018   AKI (acute kidney injury) ([place]) 12/14/2018   ESRD on hemodialysis ([place]) 12/14/2018   Gait abnormality 08/24/2018   Diabetic neuropathy ([place]) 02/06/2018   Cervical myelopathy ([place]) 02/06/2018   Onychomycosis 10/30/2017   Other spondylosis with radiculopathy, cervical region 01/27/2017   Midfoot ulcer, right, limited to breakdown of skin ([place]) 11/15/2016   Lateral epicondylitis, left elbow 08/12/2016   Cellulitis of fifth toe of right foot 07/29/2016   Prostate cancer ([place]) 06/19/2016   Non-pressure chronic ulcer of other part of right foot limited to breakdown of skin ([place]) 04/04/2016   Cellulitis of leg, right 12/24/2015   Cellulitis of right lower extremity 12/24/2015   Gout 09/14/2012   Hypertension associated with diabetes ([place]) 09/14/2012   Hypothyroidism 09/14/2012   CAD (coronary artery disease) 09/14/2012   Insulin dependent type 2 diabetes mellitus ([place]) 09/14/2012   Hyperlipidemia associated with type 2 diabetes mellitus ([place])    Neuropathy ([place])    Past Medical History:  Diagnosis Date   Ambulates with cane    straight cane   Anemia    Cervical myelopathy ([place]) 02/06/2018   Chronic kidney disease    dailysis M W F- home   Complication of anesthesia    Coronary artery disease    Diabetes mellitus without complication ([place])    type2   Diabetic foot ulcer ([place])    Diabetic neuropathy ([place]) 02/06/2018   Gait abnormality 08/24/2018   GERD  (gastroesophageal reflux disease)     01/06/20- not current   Gout    History of blood transfusion    History of blood transfusion    History of kidney stones    passed stones   Hypercholesteremia    Hypertension    Hypothyroidism    Neuromuscular disorder ([place])    neuropathy left leg and bilateral feet   Neuropathy    PONV (postoperative  nausea and vomiting)    Prostate cancer (HCC)    PVD (peripheral vascular disease) ([place])    with amputations    Family History  Problem Relation Age of Onset   Diabetes Mellitus II Mother    Kidney disease Mother    Diabetes Mellitus II Father    CAD Father    Cancer Father        prostate   Kidney disease Father    Diabetes Mellitus II Brother    Kidney disease Brother    Diabetes Mellitus II Brother    Stomach cancer Brother 30   Kidney disease Brother    Colon cancer Neg Hx    Colon polyps Neg Hx    Esophageal cancer Neg Hx    Rectal cancer Neg Hx    Pancreatic cancer Neg Hx     Past Surgical History:  Procedure Laterality Date   A-FLUTTER ABLATION N/A 04/06/2020   Procedure: A-FLUTTER ABLATION;  Surgeon: [name], MD;  Location: [facility];  Service: Cardiovascular;  Laterality: N/A;   AMPUTATION Left 12/25/2013   Procedure: AMPUTATION RAY LEFT 5TH RAY;  Surgeon: [name], MD;  [facility];  Service: Orthopedics;  Laterality: Left;   AMPUTATION Right 12/15/2018   Procedure: AMPUTATION OF 4TH AND 5TH TOES RIGHT FOOT;  Surgeon: [name], MD;  Location: [facility];  Service: Orthopedics;  Laterality: Right;   AMPUTATION Left 02/02/2021   Procedure: LEFT FOOT 4TH RAY AMPUTATION;  Surgeon: [name], MD;  Location: [facility];  Service: Orthopedics;  Laterality: Left;   AMPUTATION Left 05/09/2021   Procedure: LEFT BELOW KNEE AMPUTATION;  Surgeon: [name], MD;  Location: [facility];  Service: Orthopedics;  Laterality: Left;   APPLICATION OF WOUND VAC Right 12/15/2018   Procedure: APPLICATION OF WOUND VAC;   Surgeon: [name], MD;  Location: [facility];  Service: Orthopedics;  Laterality: Right;   AV FISTULA PLACEMENT Left 03/25/2019   Procedure: LEFT ARM ARTERIOVENOUS (AV) FISTULA CREATION;  Surgeon: [name], MD;  Location: [facility];  Service: Vascular;  Laterality: Left;   [place] Left 05/20/2019   Procedure: SECOND STAGE LEFT BASCILIC VEIN TRANSPOSITION;  Surgeon: [name], MD;  Location[facility];  Service: Vascular;  Laterality: Left;   CARDIAC CATHETERIZATION  02/17/2014   CHOLECYSTECTOMY     COLONOSCOPY  2011   in [place], [place]  2008   CYSTOSCOPY N/A 06/29/2020   Procedure: [place];  Surgeon: [name], MD;  Location: [facility];  Service: Urology;  Laterality: N/A;   CYSTOSCOPY WITH FULGERATION Bilateral 06/17/2020   Procedure: CYSTOSCOPY,BILATERAL RETROGRADE, CLOT EVACUATION WITH FULGERATION OF THE BLADDER;  Surgeon: Janith Lima, MD;  Location: [facility];  Service: Urology;  Laterality: Bilateral;   CYSTOSCOPY WITH FULGERATION N/A 06/28/2020   Procedure: CYSTOSCOPY WITH CLOT EVACUATION AND FULGERATION OF BLEEDERS;  Surgeon: [name], MD;  Location: [facility];  Service: Urology;  Laterality: N/A;  1 HR   I & D EXTREMITY Right 12/15/2018   Procedure: DEBRIDEMENT RIGHT FOOT;  Surgeon: [name], MD;  Location: [facility];  Service: Orthopedics;  Laterality: Right;   I & D EXTREMITY Right 08/27/2019   Procedure: PARTIAL CUBOID EXCISION RIGHT FOOT;  Surgeon: [name], MD;  Location: [facility];  Service: Orthopedics;  Laterality: Right;   I & D  EXTREMITY Right 01/07/2020   Procedure: RIGHT FOOT EXCISION INFECTED BONE;  Surgeon: [name], MD;  Location: [facility];  Service: Orthopedics;  Laterality: Right;   NECK SURGERY     novemver 2019   STUMP REVISION Left 06/27/2021   Procedure: REVISION LEFT BELOW KNEE AMPUTATION;  Surgeon: [name], MD;  Location: [facility];  Service: Orthopedics;  Laterality: Left;   TRANSURETHRAL RESECTION OF PROSTATE N/A 06/28/2020   Procedure: TRANSURETHRAL RESECTION OF THE PROSTATE (TURP);  Surgeon: [name], MD;  Location: [name];  Service: Urology;  Laterality: N/A;   WISDOM TOOTH EXTRACTION     Social History   Occupational History   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  Tobacco Use   Smoking status: Former    Types: Cigars    Start date: 04/08/1973    Quit date: 12/24/1988    Years since quitting: 32.7   Smokeless tobacco: Never   Tobacco comments:    Cigars and Pipe   Vaping Use   Vaping Use: Never used  Substance and Sexual Activity   Alcohol use: Never   Drug use: Never   Sexual activity: Yes    Partners: Female

## 2021-09-25 ENCOUNTER — Encounter
Payer: Medicare Other | Attending: Physical Medicine and Rehabilitation | Admitting: Physical Medicine and Rehabilitation

## 2021-09-25 ENCOUNTER — Encounter: Payer: Self-pay | Admitting: Physical Medicine and Rehabilitation

## 2021-09-25 ENCOUNTER — Telehealth: Payer: Self-pay

## 2021-09-25 VITALS — BP 153/71 | HR 63 | Ht 71.0 in | Wt 193.0 lb

## 2021-09-25 DIAGNOSIS — R400 Somnolence: Secondary | ICD-10-CM | POA: Diagnosis present

## 2021-09-25 DIAGNOSIS — R4189 Other symptoms and signs involving cognitive functions and awareness: Secondary | ICD-10-CM | POA: Diagnosis present

## 2021-09-25 DIAGNOSIS — S88112S Complete traumatic amputation at level between knee and ankle, left lower leg, sequela: Secondary | ICD-10-CM | POA: Diagnosis present

## 2021-09-25 DIAGNOSIS — Z89512 Acquired absence of left leg below knee: Secondary | ICD-10-CM | POA: Diagnosis present

## 2021-09-25 MED ORDER — MODAFINIL 100 MG PO TABS
100.0000 mg | ORAL_TABLET | Freq: Every day | ORAL | 3 refills | Status: DC
Start: 2021-09-25 — End: 2021-11-05

## 2021-09-25 NOTE — Telephone Encounter (Signed)
Patient called to speak with Dr. Ranell Patrick because he had a few questions. He wants to know the name of the fish oil and he wants a referral for Outpatient therapy. He would like a return call back from Dr. Ranell Patrick

## 2021-09-25 NOTE — Progress Notes (Unsigned)
Subjective:    Patient ID: [name], male    DOB: 01/12/1951, 71 y.o.   MRN: [id number]  HPI Mrs.    Pain Inventory Average Pain 1 Pain Right Now 1 My pain is intermittent and tingling  In the last 24 hours, has pain interfered with the following? General activity 0 Relation with others 0 Enjoyment of life 0 What TIME of day is your pain at its worst? morning , daytime, evening, and night Sleep (in general)  sleeping too much  Pain is worse with: unsure Pain improves with:  It does not bother me much. Relief from Meds:  none thing taken  ability to climb steps?  no do you drive?  no use a wheelchair transfers alone Do you have any goals in this area?  yes  employed # of hrs/week Two hours per week. Self employed.  retired I need assistance with the following:  dressing, bathing, toileting, meal prep, household duties, and shopping Do you have any goals in this area?  yes  numbness tremor trouble walking spasms depression loss of taste or smell  Any changes since last visit?  yes, Chest XRAY at [facility]  Any changes since last visit?  no    Family History  Problem Relation Age of Onset   Diabetes Mellitus II Mother    Kidney disease Mother    Diabetes Mellitus II Father    CAD Father    Cancer Father        prostate   Kidney disease Father    Diabetes Mellitus II Brother    Kidney disease Brother    Diabetes Mellitus II Brother    Stomach cancer Brother 107   Kidney disease Brother    Colon cancer Neg Hx    Colon polyps Neg Hx    Esophageal cancer Neg Hx    Rectal cancer Neg Hx    Pancreatic cancer Neg Hx    Social History   Socioeconomic History   Marital status: Married    Spouse name: Not on file   Number of children: 2   Years of education: Not on file   Highest education level: Not on file  Occupational History   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  Tobacco Use   Smoking status: Former    Types: Cigars    Start date: 04/08/1973     Quit date: 12/24/1988    Years since quitting: 32.7   Smokeless tobacco: Never   Tobacco comments:    Cigars and Pipe   Vaping Use   Vaping Use: Never used  Substance and Sexual Activity   Alcohol use: Never   Drug use: Never   Sexual activity: Yes    Partners: Female  Other Topics Concern   Not on file  Social History Narrative   Regular exercise: yes 3 times a week   Caffeine use: hot tea   Social Determinants of Health   Financial Resource Strain: Not on file  Food Insecurity: Not on file  Transportation Needs: Not on file  Physical Activity: Not on file  Stress: Not on file  Social Connections: Not on file   Past Surgical History:  Procedure Laterality Date   A-FLUTTER ABLATION N/A 04/06/2020   Procedure: A-FLUTTER ABLATION;  Surgeon: [name], MD;  Location: [place] [facility];  Service: Cardiovascular;  Laterality: N/A;   AMPUTATION Left 12/25/2013   Procedure: AMPUTATION RAY LEFT 5TH RAY;  Surgeon: [name], MD;  [facility];  Service:  Orthopedics;  Laterality: Left;   AMPUTATION Right 12/15/2018   Procedure: AMPUTATION OF 4TH AND 5TH TOES RIGHT FOOT;  Surgeon: [name], MD;  Location: [place];  Service: Orthopedics;  Laterality: Right;   AMPUTATION Left 02/02/2021   Procedure: LEFT FOOT 4TH RAY AMPUTATION;  Surgeon: [name], MD;  Location: [place];  Service: Orthopedics;  Laterality: Left;   AMPUTATION Left 05/09/2021   Procedure: LEFT BELOW KNEE AMPUTATION;  Surgeon: [name], MD;  Location: [place];  Service: Orthopedics;  Laterality: Left;   APPLICATION OF WOUND VAC Right 12/15/2018   Procedure: APPLICATION OF WOUND VAC;  Surgeon: [name], MD;  Location: [place];  Service: Orthopedics;  Laterality: Right;   AV FISTULA PLACEMENT Left 03/25/2019   Procedure: LEFT ARM ARTERIOVENOUS (AV) FISTULA CREATION;  Surgeon: [name], MD;  Location: [place] [facility];  Service: Vascular;  Laterality: Left;   [place]  Left 05/20/2019   Procedure: SECOND STAGE LEFT BASCILIC VEIN TRANSPOSITION;  Surgeon: [name], MD;  Location: [place] [facility];  Service: Vascular;  Laterality: Left;   CARDIAC CATHETERIZATION  02/17/2014   CHOLECYSTECTOMY     COLONOSCOPY  2011   in [place], [place]  2008   CYSTOSCOPY N/A 06/29/2020   Procedure: [place];  Surgeon: [name], MD;  Location: [place];  Service: Urology;  Laterality: N/A;   CYSTOSCOPY WITH FULGERATION Bilateral 06/17/2020   Procedure: CYSTOSCOPY,BILATERAL RETROGRADE, CLOT EVACUATION WITH FULGERATION OF THE BLADDER;  Surgeon: Janith Lima, MD;  Location: [name];  Service: Urology;  Laterality: Bilateral;   CYSTOSCOPY WITH FULGERATION N/A 06/28/2020   Procedure: CYSTOSCOPY WITH CLOT EVACUATION AND FULGERATION OF BLEEDERS;  Surgeon: [name], MD;  Location: [place];  Service: Urology;  Laterality: N/A;  1 HR   I & D EXTREMITY Right 12/15/2018   Procedure: DEBRIDEMENT RIGHT FOOT;  Surgeon: [name], MD;  Location: [place];  Service: Orthopedics;  Laterality: Right;   I & D EXTREMITY Right 08/27/2019   Procedure: PARTIAL CUBOID EXCISION RIGHT FOOT;  Surgeon: [name], MD;  Location: [place];  Service: Orthopedics;  Laterality: Right;   I & D EXTREMITY Right 01/07/2020   Procedure: RIGHT FOOT EXCISION INFECTED BONE;  Surgeon: [name], MD;  Location: [place];  Service: Orthopedics;  Laterality: Right;   NECK SURGERY     novemver 2019   STUMP REVISION Left 06/27/2021   Procedure: REVISION LEFT BELOW KNEE AMPUTATION;  Surgeon: [name], MD;  Location: [place];  Service: Orthopedics;  Laterality: Left;   TRANSURETHRAL RESECTION OF PROSTATE N/A 06/28/2020   Procedure: TRANSURETHRAL RESECTION OF THE PROSTATE (TURP);  Surgeon: [name], MD;  Location: [place];  Service: Urology;  Laterality: N/A;   WISDOM TOOTH EXTRACTION     Past Medical History:  Diagnosis Date    Ambulates with cane    straight cane   Anemia    Cervical myelopathy ([place]) 02/06/2018   Chronic kidney disease    dailysis M [facility] F- home   Complication of anesthesia    Coronary artery disease    Diabetes mellitus without complication ([place])    type2   Diabetic foot ulcer ([place])    Diabetic neuropathy ([place]) 02/06/2018   Gait abnormality 08/24/2018   GERD (gastroesophageal reflux disease)     01/06/20- not current   Gout  History of blood transfusion    History of blood transfusion    History of kidney stones    passed stones   Hypercholesteremia    Hypertension    Hypothyroidism    Neuromuscular disorder (HCC)    neuropathy left leg and bilateral feet   Neuropathy    PONV (postoperative nausea and vomiting)    Prostate cancer (HCC)    PVD (peripheral vascular disease) (HCC)    with amputations   BP (!) 153/71   Pulse 63   Ht '5\' 11"'$  (1.803 m)   Wt 193 lb (87.5 kg)   SpO2 96%   BMI 26.92 kg/m   Opioid Risk Score:   Fall Risk Score:  `1  Depression screen [facility] 2/9     02/28/2020    3:07 PM 01/10/2020    4:21 PM 06/20/2016    8:18 AM  Depression screen PHQ 2/9  Decreased Interest 0 0 0  Down, Depressed, Hopeless 0 0 0  PHQ - 2 Score 0 0 0    Review of Systems  Constitutional:  Positive for appetite change.       Loss of taste  Musculoskeletal:  Positive for gait problem.  Neurological:  Positive for tremors and numbness.       Spasms  Psychiatric/Behavioral:         Depression  All other systems reviewed and are negative.      Objective:   Physical Exam        Assessment & Plan:

## 2021-09-25 NOTE — Progress Notes (Deleted)
   Subjective:    Patient ID: [name], male    DOB: 04-23-1950, 71 y.o.   MRN: [id number]  HPI   .CPR Review of Systems     Objective:   Physical Exam        Assessment & Plan:

## 2021-09-26 ENCOUNTER — Encounter (HOSPITAL_BASED_OUTPATIENT_CLINIC_OR_DEPARTMENT_OTHER): Payer: Medicare Other | Admitting: Physical Medicine and Rehabilitation

## 2021-09-26 DIAGNOSIS — R4189 Other symptoms and signs involving cognitive functions and awareness: Secondary | ICD-10-CM | POA: Diagnosis not present

## 2021-09-26 DIAGNOSIS — Z89512 Acquired absence of left leg below knee: Secondary | ICD-10-CM | POA: Diagnosis not present

## 2021-09-26 NOTE — Progress Notes (Signed)
Subjective:    Patient ID: [name], male    DOB: [birth year], 71 y.o.   MRN: [id number]  HPI An audio/video tele-health visit is felt to be the most appropriate encounter for this patient at this time. This is a follow up tele-visit via phone. The patient is at home. MD is at office. Prior to scheduling this appointment, our staff discussed the limitations of evaluation and management by telemedicine and the availability of in-person appointments. The patient expressed understanding and agreed to proceed.   [name] is a 71 year old man who presents for follow-up of BKA  1) Left BKA -he asks about physical therapy -he would like to keep to keep up with his physical conditioning  2) Cognitive deficits -he asks about modafinil -he asks what brand of fish oil is preferred.   Pain Inventory Average Pain 1 Pain Right Now 1 My pain is intermittent and tingling  In the last 24 hours, has pain interfered with the following? General activity 0 Relation with others 0 Enjoyment of life 0 What TIME of day is your pain at its worst? morning , daytime, evening, and night Sleep (in general)  sleeping too much  Pain is worse with: unsure Pain improves with:  It does not bother me much. Relief from Meds:  none thing taken  ability to climb steps?  no do you drive?  no use a wheelchair transfers alone Do you have any goals in this area?  yes  employed # of hrs/week Two hours per week. Self employed.  retired I need assistance with the following:  dressing, bathing, toileting, meal prep, household duties, and shopping Do you have any goals in this area?  yes  numbness tremor trouble walking spasms depression loss of taste or smell  Any changes since last visit?  yes, [facility] at [facility]  Any changes since last visit?  no    Family History  Problem Relation Age of Onset   Diabetes Mellitus II Mother    Kidney disease Mother    Diabetes Mellitus II Father    CAD Father     Cancer Father        prostate   Kidney disease Father    Diabetes Mellitus II Brother    Kidney disease Brother    Diabetes Mellitus II Brother    Stomach cancer Brother 60   Kidney disease Brother    Colon cancer Neg Hx    Colon polyps Neg Hx    Esophageal cancer Neg Hx    Rectal cancer Neg Hx    Pancreatic cancer Neg Hx    Social History   Socioeconomic History   Marital status: Married    Spouse name: Not on file   Number of children: 2   Years of education: Not on file   Highest education level: Not on file  Occupational History   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  Tobacco Use   Smoking status: Former    Types: Cigars    Start date: 04/08/1973    Quit date: 12/24/1988    Years since quitting: 32.7   Smokeless tobacco: Never   Tobacco comments:    Cigars and Pipe   Vaping Use   Vaping Use: Never used  Substance and Sexual Activity   Alcohol use: Never   Drug use: Never   Sexual activity: Yes    Partners: Female  Other Topics Concern   Not on file  Social History Narrative   Regular exercise:  yes 3 times a week   Caffeine use: hot tea   Social Determinants of Health   Financial Resource Strain: Not on file  Food Insecurity: Not on file  Transportation Needs: Not on file  Physical Activity: Not on file  Stress: Not on file  Social Connections: Not on file   Past Surgical History:  Procedure Laterality Date   A-FLUTTER ABLATION N/A 04/06/2020   Procedure: A-FLUTTER ABLATION;  Surgeon: [name], MD;  Location: [place] [facility];  Service: Cardiovascular;  Laterality: N/A;   AMPUTATION Left 12/25/2013   Procedure: AMPUTATION RAY LEFT 5TH RAY;  Surgeon: [name], MD;  [facility];  Service: Orthopedics;  Laterality: Left;   AMPUTATION Right 12/15/2018   Procedure: AMPUTATION OF 4TH AND 5TH TOES RIGHT FOOT;  Surgeon: [name], MD;  Location: [place];  Service: Orthopedics;  Laterality: Right;   AMPUTATION Left 02/02/2021   Procedure:  LEFT FOOT 4TH RAY AMPUTATION;  Surgeon: [name], MD;  Location: [place];  Service: Orthopedics;  Laterality: Left;   AMPUTATION Left 05/09/2021   Procedure: LEFT BELOW KNEE AMPUTATION;  Surgeon: [name], MD;  Location: [place];  Service: Orthopedics;  Laterality: Left;   APPLICATION OF WOUND VAC Right 12/15/2018   Procedure: APPLICATION OF WOUND VAC;  Surgeon: [name], MD;  Location: [place];  Service: Orthopedics;  Laterality: Right;   AV FISTULA PLACEMENT Left 03/25/2019   Procedure: LEFT ARM ARTERIOVENOUS (AV) FISTULA CREATION;  Surgeon: [name], MD;  Location: [place] [facility];  Service: Vascular;  Laterality: Left;   [place] Left 05/20/2019   Procedure: SECOND STAGE LEFT BASCILIC VEIN TRANSPOSITION;  Surgeon: [name], MD;  Location: [place] [facility];  Service: Vascular;  Laterality: Left;   CARDIAC CATHETERIZATION  02/17/2014   CHOLECYSTECTOMY     COLONOSCOPY  2011   in [place], [place]  2008   CYSTOSCOPY N/A 06/29/2020   Procedure: [place];  Surgeon: [name], MD;  Location: [place];  Service: Urology;  Laterality: N/A;   CYSTOSCOPY WITH FULGERATION Bilateral 06/17/2020   Procedure: CYSTOSCOPY,BILATERAL RETROGRADE, CLOT EVACUATION WITH FULGERATION OF THE BLADDER;  Surgeon: Janith Lima, MD;  Location: [place];  Service: Urology;  Laterality: Bilateral;   CYSTOSCOPY WITH FULGERATION N/A 06/28/2020   Procedure: CYSTOSCOPY WITH CLOT EVACUATION AND FULGERATION OF BLEEDERS;  Surgeon: [name], MD;  Location: [name];  Service: Urology;  Laterality: N/A;  1 HR   I & D EXTREMITY Right 12/15/2018   Procedure: DEBRIDEMENT RIGHT FOOT;  Surgeon: [name], MD;  Location: [place];  Service: Orthopedics;  Laterality: Right;   I & D EXTREMITY Right 08/27/2019   Procedure: PARTIAL CUBOID EXCISION RIGHT FOOT;  Surgeon: [name], MD;  Location: [place];  Service:  Orthopedics;  Laterality: Right;   I & D EXTREMITY Right 01/07/2020   Procedure: RIGHT FOOT EXCISION INFECTED BONE;  Surgeon: [name], MD;  Location: [place];  Service: Orthopedics;  Laterality: Right;   NECK SURGERY     novemver 2019   STUMP REVISION Left 06/27/2021   Procedure: REVISION LEFT BELOW KNEE AMPUTATION;  Surgeon: [name], MD;  Location: [place];  Service: Orthopedics;  Laterality: Left;   TRANSURETHRAL RESECTION OF PROSTATE N/A 06/28/2020   Procedure: TRANSURETHRAL RESECTION OF THE PROSTATE (TURP);  Surgeon: [name], MD;  [facility];  Service: Urology;  Laterality: N/A;   WISDOM TOOTH EXTRACTION     Past Medical History:  Diagnosis Date   Ambulates with cane    straight cane   Anemia    Cervical myelopathy ([place]) 02/06/2018   Chronic kidney disease    dailysis M W F- home   Complication of anesthesia    Coronary artery disease    Diabetes mellitus without complication ([place])    type2   Diabetic foot ulcer ([place])    Diabetic neuropathy ([place]) 02/06/2018   Gait abnormality 08/24/2018   GERD (gastroesophageal reflux disease)     01/06/20- not current   Gout    History of blood transfusion    History of blood transfusion    History of kidney stones    passed stones   Hypercholesteremia    Hypertension    Hypothyroidism    Neuromuscular disorder (HCC)    neuropathy left leg and bilateral feet   Neuropathy    PONV (postoperative nausea and vomiting)    Prostate cancer ([place])    PVD (peripheral vascular disease) ([place])    with amputations   There were no vitals taken for this visit.  Opioid Risk Score:   Fall Risk Score:  `1  Depression screen [facility] 2/9     09/25/2021   11:00 AM 02/28/2020    3:07 PM 01/10/2020    4:21 PM 06/20/2016    8:18 AM  Depression screen PHQ 2/9  Decreased Interest 1 0 0 0  Down, Depressed, Hopeless 3 0 0 0  PHQ - 2 Score 4 0 0 0  Altered sleeping 3     Tired, decreased energy 3     Change in appetite 3      Feeling bad or failure about yourself  0     Trouble concentrating 3     Moving slowly or fidgety/restless 3     Suicidal thoughts 0     PHQ-9 Score 19       Review of Systems  Constitutional:  Positive for appetite change.       Loss of taste  Musculoskeletal:  Positive for gait problem.  Neurological:  Positive for tremors and numbness.       Spasms  Psychiatric/Behavioral:         Depression  All other systems reviewed and are negative.      Objective:   Physical Exam  Not performed      Assessment & Plan:  1) L BKA -prescribed PT  2) Cognitive deficits -discussed that recommended brand of fish oil was Rosita -discussed that Modafinil is likely undergoing prior auth and we should know by tomorrow whether it is approved -will call patient with MRI brain results once available  7 minutes spent in discussion of physical therapy for conditioning prior to receiving prosthesis, use of Rosita fish oil for cognitive impairments

## 2021-09-27 ENCOUNTER — Telehealth: Payer: Self-pay

## 2021-09-27 MED ORDER — MODAFINIL 100 MG PO TABS
100.0000 mg | ORAL_TABLET | Freq: Every day | ORAL | 3 refills | Status: DC
Start: 2021-09-27 — End: 2021-11-05

## 2021-09-27 NOTE — Telephone Encounter (Signed)
[name] has requested the Modafinil 100 MG to be sent to [facility] on [address]. Please forward Rx.

## 2021-10-01 ENCOUNTER — Encounter: Payer: Medicare Other | Admitting: Orthopedic Surgery

## 2021-10-01 ENCOUNTER — Ambulatory Visit (INDEPENDENT_AMBULATORY_CARE_PROVIDER_SITE_OTHER): Payer: Medicare Other | Admitting: Orthopedic Surgery

## 2021-10-01 DIAGNOSIS — Z89512 Acquired absence of left leg below knee: Secondary | ICD-10-CM

## 2021-10-01 DIAGNOSIS — S88112A Complete traumatic amputation at level between knee and ankle, left lower leg, initial encounter: Secondary | ICD-10-CM

## 2021-10-02 ENCOUNTER — Encounter: Payer: Self-pay | Admitting: Orthopedic Surgery

## 2021-10-02 NOTE — Progress Notes (Signed)
Office Visit Note   Patient: [name]           Date of Birth: October 14, 1950           MRN: [id number] Visit Date: 10/01/2021              Requested by: Adolph Pollack, FNP [id number] [place],  [place] [address] PCP: Adolph Pollack, FNP  Chief Complaint  Patient presents with   Left Leg - Routine Post Op    06/27/2021 revision left BKA       HPI: Patient is a 71 year old gentleman who presents 54-month status post revision left transtibial amputation.  Assessment & Plan: Visit Diagnoses:  1. Below-knee amputation of left lower extremity (HCC)     Plan: Patient has a follow-up appointment with [facility] he should be able to be casted for his prosthesis at this time.  Follow-Up Instructions: Return in about 4 weeks (around 10/29/2021).   Ortho Exam  Patient is alert, oriented, no adenopathy, well-dressed, normal affect, normal respiratory effort. Examination patient is a small scab that was removed there is good healthy tissue at the base there is no tunneling no drainage no cellulitis.  Patient has a well consolidated residual limb.  Imaging: No results found. No images are attached to the encounter.  Labs: Lab Results  Component Value Date   HGBA1C 5.5 05/04/2021   HGBA1C 6.3 (H) 02/02/2021   HGBA1C 5.4 09/13/2020   ESRSEDRATE 34 (H) 01/24/2020   ESRSEDRATE 41 (H) 01/10/2020   ESRSEDRATE 55 (H) 12/27/2019   CRP 8.8 (H) 01/24/2020   CRP 35.9 (H) 01/10/2020   CRP 77.9 (H) 12/27/2019   REPTSTATUS 08/23/2021 FINAL 08/18/2021   GRAMSTAIN  06/27/2021    RARE WBC PRESENT, PREDOMINANTLY MONONUCLEAR NO ORGANISMS SEEN    CULT  08/18/2021    NO GROWTH 5 DAYS Performed at [facility], [address], [place], [place] [address]    LABORGA PSEUDOMONAS AERUGINOSA 06/27/2021     Lab Results  Component Value Date   ALBUMIN 3.3 (L) 08/20/2021   ALBUMIN 3.7 08/19/2021   ALBUMIN 4.0 08/18/2021   PREALBUMIN 32.7 05/09/2021   PREALBUMIN 14.8  (L) 12/14/2018    Lab Results  Component Value Date   MG 2.4 05/14/2021   MG 1.9 05/09/2021   MG 2.0 06/28/2020   Lab Results  Component Value Date   VD25OH 16.13 (L) 05/09/2021    Lab Results  Component Value Date   PREALBUMIN 32.7 05/09/2021   PREALBUMIN 14.8 (L) 12/14/2018      Latest Ref Rng & Units 08/20/2021    1:44 AM 08/19/2021    6:10 AM 08/18/2021    8:34 PM  CBC EXTENDED  WBC 4.0 - 10.5 K/uL 4.7  6.3  6.3   RBC 4.22 - 5.81 MIL/uL 3.18  3.45  4.01   Hemoglobin 13.0 - 17.0 g/dL 9.0  9.7  32.4   HCT 40.1 - 52.0 % 27.8  31.1  35.0   Platelets 150 - 400 K/uL 82  83  113   NEUT# 1.7 - 7.7 K/uL   5.0   Lymph# 0.7 - 4.0 K/uL   0.3      There is no height or weight on file to calculate BMI.  Orders:  No orders of the defined types were placed in this encounter.  No orders of the defined types were placed in this encounter.    Procedures: No procedures performed  Clinical  Data: No additional findings.  ROS:  All other systems negative, except as noted in the HPI. Review of Systems  Objective: Vital Signs: There were no vitals taken for this visit.  Specialty Comments:  No specialty comments available.  PMFS History: Patient Active Problem List   Diagnosis Date Noted   Community acquired pneumonia of left lower lobe of lung 08/18/2021   Paroxysmal atrial fibrillation (HCC) 08/18/2021   Anemia of chronic renal failure 08/18/2021   Thrombocytopenia (HCC) 08/18/2021   Acute metabolic encephalopathy 08/18/2021   Action induced myoclonus 08/18/2021   Dehiscence of amputation stump of left lower extremity (HCC)    Left below-knee amputee (HCC) 05/11/2021   S/P BKA (below knee amputation) unilateral, left (HCC) 05/09/2021   Partial nontraumatic amputation of foot, 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   Abscess of bursa of left foot 02/03/2021   Cutaneous abscess of left foot    Acute osteomyelitis of metatarsal bone of left foot (HCC)    Symptomatic anemia  09/13/2020   COVID-19 virus infection 09/13/2020   Pressure injury of skin 06/29/2020   Gross hematuria 06/16/2020   Typical atrial flutter (HCC) 03/24/2020   Bilateral pleural effusion 02/24/2020   Healthcare maintenance 01/28/2020   Shortness of breath 01/28/2020   History of partial ray amputation of fourth toe of right foot (HCC) 09/08/2019   Ulcerated, foot, right, with necrosis of bone (HCC)    Chronic cough 06/25/2019   Cutaneous abscess of right foot    Subacute osteomyelitis of right foot (HCC) 12/14/2018   AKI (acute kidney injury) (HCC) 12/14/2018   ESRD on hemodialysis (HCC) 12/14/2018   Gait abnormality 08/24/2018   Diabetic neuropathy (HCC) 02/06/2018   Cervical myelopathy (HCC) 02/06/2018   Onychomycosis 10/30/2017   Other spondylosis with radiculopathy, cervical region 01/27/2017   Midfoot ulcer, right, limited to breakdown of skin (HCC) 11/15/2016   Lateral epicondylitis, left elbow 08/12/2016   Cellulitis of fifth toe of right foot 07/29/2016   Prostate cancer (HCC) 06/19/2016   Non-pressure chronic ulcer of other part of right foot limited to breakdown of skin (HCC) 04/04/2016   Cellulitis of leg, right 12/24/2015   Cellulitis of right lower extremity 12/24/2015   Gout 09/14/2012   Hypertension associated with diabetes (HCC) 09/14/2012   Hypothyroidism 09/14/2012   CAD (coronary artery disease) 09/14/2012   Insulin dependent type 2 diabetes mellitus (HCC) 09/14/2012   Hyperlipidemia associated with type 2 diabetes mellitus (HCC)    Neuropathy (HCC)    Past Medical History:  Diagnosis Date   Ambulates with cane    straight cane   Anemia    Cervical myelopathy (HCC) 02/06/2018   Chronic kidney disease    dailysis M W F- home   Complication of anesthesia    Coronary artery disease    Diabetes mellitus without complication (HCC)    type2   Diabetic foot ulcer (HCC)    Diabetic neuropathy (HCC) 02/06/2018   Gait abnormality 08/24/2018   GERD  (gastroesophageal reflux disease)     01/06/20- not current   Gout    History of blood transfusion    History of blood transfusion    History of kidney stones    passed stones   Hypercholesteremia    Hypertension    Hypothyroidism    Neuromuscular disorder (HCC)    neuropathy left leg and bilateral feet   Neuropathy    PONV (postoperative nausea and vomiting)    Prostate cancer (HCC)    PVD (peripheral vascular disease) (HCC)  with amputations    Family History  Problem Relation Age of Onset   Diabetes Mellitus II Mother    Kidney disease Mother    Diabetes Mellitus II Father    CAD Father    Cancer Father        prostate   Kidney disease Father    Diabetes Mellitus II Brother    Kidney disease Brother    Diabetes Mellitus II Brother    Stomach cancer Brother 40   Kidney disease Brother    Colon cancer Neg Hx    Colon polyps Neg Hx    Esophageal cancer Neg Hx    Rectal cancer Neg Hx    Pancreatic cancer Neg Hx     Past Surgical History:  Procedure Laterality Date   A-FLUTTER ABLATION N/A 04/06/2020   Procedure: A-FLUTTER ABLATION;  Surgeon: Marinus Maw, MD;  [facility];  Service: Cardiovascular;  Laterality: N/A;   AMPUTATION Left 12/25/2013   Procedure: AMPUTATION RAY LEFT 5TH RAY;  Surgeon: Nadara Mustard, MD;  Location: [facility];  Service: Orthopedics;  Laterality: Left;   AMPUTATION Right 12/15/2018   Procedure: AMPUTATION OF 4TH AND 5TH TOES RIGHT FOOT;  Surgeon: Nadara Mustard, MD;  Location: [facility] OR;  Service: Orthopedics;  Laterality: Right;   AMPUTATION Left 02/02/2021   Procedure: LEFT FOOT 4TH RAY AMPUTATION;  Surgeon: Nadara Mustard, MD;  Location: [facility] OR;  Service: Orthopedics;  Laterality: Left;   AMPUTATION Left 05/09/2021   Procedure: LEFT BELOW KNEE AMPUTATION;  Surgeon: Nadara Mustard, MD;  Location: [facility] OR;  Service: Orthopedics;  Laterality: Left;   APPLICATION OF WOUND VAC Right 12/15/2018   Procedure: APPLICATION OF WOUND VAC;   Surgeon: Nadara Mustard, MD;  Location: [facility];  Service: Orthopedics;  Laterality: Right;   AV FISTULA PLACEMENT Left 03/25/2019   Procedure: LEFT ARM ARTERIOVENOUS (AV) FISTULA CREATION;  Surgeon: Nada Libman, MD;  Location: [facility];  Service: Vascular;  Laterality: Left;   BACK SURGERY     BASCILIC VEIN TRANSPOSITION Left 05/20/2019   Procedure: SECOND STAGE LEFT BASCILIC VEIN TRANSPOSITION;  Surgeon: Nada Libman, MD;  Location: [facility] OR;  Service: Vascular;  Laterality: Left;   CARDIAC CATHETERIZATION  02/17/2014   CHOLECYSTECTOMY     COLONOSCOPY  2011   in [place], [place]   CORONARY ARTERY BYPASS GRAFT  2008   CYSTOSCOPY N/A 06/29/2020   Procedure: CYSTOSCOPY WITH CLOT EVACUATION AND  FULGERATION;  Surgeon: Marcine Matar, MD;  Location: [facility] OR;  Service: Urology;  Laterality: N/A;   CYSTOSCOPY WITH FULGERATION Bilateral 06/17/2020   Procedure: CYSTOSCOPY,BILATERAL RETROGRADE, CLOT EVACUATION WITH FULGERATION OF THE BLADDER;  Surgeon: Jannifer Hick, MD;  Location: [facility] OR;  Service: Urology;  Laterality: Bilateral;   CYSTOSCOPY WITH FULGERATION N/A 06/28/2020   Procedure: CYSTOSCOPY WITH CLOT EVACUATION AND FULGERATION OF BLEEDERS;  Surgeon: Marcine Matar, MD;  Location: [facility] OR;  Service: Urology;  Laterality: N/A;  1 HR   I & D EXTREMITY Right 12/15/2018   Procedure: DEBRIDEMENT RIGHT FOOT;  Surgeon: Nadara Mustard, MD;  Location: [facility] OR;  Service: Orthopedics;  Laterality: Right;   I & D EXTREMITY Right 08/27/2019   Procedure: PARTIAL CUBOID EXCISION RIGHT FOOT;  Surgeon: Nadara Mustard, MD;  Location: [facility] OR;  Service: Orthopedics;  Laterality: Right;   I & D EXTREMITY Right 01/07/2020   Procedure: RIGHT FOOT EXCISION INFECTED BONE;  Surgeon: Nadara Mustard, MD;  Location: [facility]  OR;  Service: Orthopedics;  Laterality: Right;   NECK SURGERY     novemver 2019   STUMP REVISION Left 06/27/2021   Procedure: REVISION LEFT BELOW KNEE AMPUTATION;  Surgeon: Nadara Mustard, MD;  Location: [facility]  OR;  Service: Orthopedics;  Laterality: Left;   TRANSURETHRAL RESECTION OF PROSTATE N/A 06/28/2020   Procedure: TRANSURETHRAL RESECTION OF THE PROSTATE (TURP);  Surgeon: Marcine Matar, MD;  Location: [facility] OR;  Service: Urology;  Laterality: N/A;   WISDOM TOOTH EXTRACTION     Social History   Occupational History   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  Tobacco Use   Smoking status: Former    Types: Cigars    Start date: 04/08/1973    Quit date: 12/24/1988    Years since quitting: 32.7   Smokeless tobacco: Never   Tobacco comments:    Cigars and Pipe   Vaping Use   Vaping Use: Never used  Substance and Sexual Activity   Alcohol use: Never   Drug use: Never   Sexual activity: Yes    Partners: Female

## 2021-10-16 ENCOUNTER — Ambulatory Visit
Admission: RE | Admit: 2021-10-16 | Discharge: 2021-10-16 | Disposition: A | Discharge status: Home / Self Care | Payer: Medicare Other | Source: Ambulatory Visit | Attending: Physical Medicine and Rehabilitation | Admitting: Physical Medicine and Rehabilitation

## 2021-10-16 DIAGNOSIS — R4189 Other symptoms and signs involving cognitive functions and awareness: Secondary | ICD-10-CM

## 2021-10-17 ENCOUNTER — Encounter
Payer: Medicare Other | Attending: Physical Medicine and Rehabilitation | Admitting: Physical Medicine and Rehabilitation

## 2021-10-17 DIAGNOSIS — R4189 Other symptoms and signs involving cognitive functions and awareness: Secondary | ICD-10-CM | POA: Diagnosis not present

## 2021-10-17 NOTE — Progress Notes (Signed)
Subjective:    Patient ID: [name], male    DOB: 1950/12/03, 71 y.o.   MRN: [id number]  HPI An audio/video tele-health visit is felt to be the most appropriate encounter for this patient at this time. This is a follow up tele-visit via phone. The patient is at home. MD is at office. Prior to scheduling this appointment, our staff discussed the limitations of evaluation and management by telemedicine and the availability of in-person appointments. The patient expressed understanding and agreed to proceed.   [name] is a 71 year old man who presents for f/u of BKA and cognitive deficits  1) Left BKA -he asks about physical therapy -he would like to keep to keep up with his physical conditioning  2) Cognitive deficits -he has noted great improvements with modafinil and asks if he can continue this -he asks about MRI results -he has been taking fish oil supplement   Pain Inventory Average Pain 1 Pain Right Now 1 My pain is intermittent and tingling  In the last 24 hours, has pain interfered with the following? General activity 0 Relation with others 0 Enjoyment of life 0 What TIME of day is your pain at its worst? morning , daytime, evening, and night Sleep (in general)  sleeping too much  Pain is worse with: unsure Pain improves with:  It does not bother me much. Relief from Meds:  none thing taken  ability to climb steps?  no do you drive?  no use a wheelchair transfers alone Do you have any goals in this area?  yes  employed # of hrs/week Two hours per week. Self employed.  retired I need assistance with the following:  dressing, bathing, toileting, meal prep, household duties, and shopping Do you have any goals in this area?  yes  numbness tremor trouble walking spasms depression loss of taste or smell  Any changes since last visit?  yes, Chest XRAY at [facility]  Any changes since last visit?  no    Family History  Problem Relation Age of Onset    Diabetes Mellitus II Mother    Kidney disease Mother    Diabetes Mellitus II Father    CAD Father    Cancer Father        prostate   Kidney disease Father    Diabetes Mellitus II Brother    Kidney disease Brother    Diabetes Mellitus II Brother    Stomach cancer Brother 62   Kidney disease Brother    Colon cancer Neg Hx    Colon polyps Neg Hx    Esophageal cancer Neg Hx    Rectal cancer Neg Hx    Pancreatic cancer Neg Hx    Social History   Socioeconomic History   Marital status: Married    Spouse name: Not on file   Number of children: 2   Years of education: Not on file   Highest education level: Not on file  Occupational History   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  Tobacco Use   Smoking status: Former    Types: Cigars    Start date: 04/08/1973    Quit date: 12/24/1988    Years since quitting: 32.8   Smokeless tobacco: Never   Tobacco comments:    Cigars and Pipe   Vaping Use   Vaping Use: Never used  Substance and Sexual Activity   Alcohol use: Never   Drug use: Never   Sexual activity: Yes    Partners: Female  Other Topics Concern   Not on file  Social History Narrative   Regular exercise: yes 3 times a week   Caffeine use: hot tea   Social Determinants of Health   Financial Resource Strain: Not on file  Food Insecurity: Not on file  Transportation Needs: Not on file  Physical Activity: Not on file  Stress: Not on file  Social Connections: Not on file   Past Surgical History:  Procedure Laterality Date   A-FLUTTER ABLATION N/A 04/06/2020   Procedure: A-FLUTTER ABLATION;  Surgeon: [name], MD;  Location: [place] [facility];  Service: Cardiovascular;  Laterality: N/A;   AMPUTATION Left 12/25/2013   Procedure: AMPUTATION RAY LEFT 5TH RAY;  Surgeon: [name], MD;  Location: [facility];  Service: Orthopedics;  Laterality: Left;   AMPUTATION Right 12/15/2018   Procedure: AMPUTATION OF 4TH AND 5TH TOES RIGHT FOOT;  Surgeon: [name], MD;   Location: [place];  Service: Orthopedics;  Laterality: Right;   AMPUTATION Left 02/02/2021   Procedure: LEFT FOOT 4TH RAY AMPUTATION;  Surgeon: [name], MD;  Location: [place];  Service: Orthopedics;  Laterality: Left;   AMPUTATION Left 05/09/2021   Procedure: LEFT BELOW KNEE AMPUTATION;  Surgeon: [name], MD;  Location: [place];  Service: Orthopedics;  Laterality: Left;   APPLICATION OF WOUND VAC Right 12/15/2018   Procedure: APPLICATION OF WOUND VAC;  Surgeon: [name], MD;  Location: [place];  Service: Orthopedics;  Laterality: Right;   AV FISTULA PLACEMENT Left 03/25/2019   Procedure: LEFT ARM ARTERIOVENOUS (AV) FISTULA CREATION;  Surgeon: [name], MD;  Location: [place] OR;  Service: Vascular;  Laterality: Left;   [place] Left 05/20/2019   Procedure: SECOND STAGE LEFT BASCILIC VEIN TRANSPOSITION;  Surgeon: [name], MD;  Location: [place] OR;  Service: Vascular;  Laterality: Left;   CARDIAC CATHETERIZATION  02/17/2014   CHOLECYSTECTOMY     COLONOSCOPY  2011   in [place], [place]  2008   CYSTOSCOPY N/A 06/29/2020   Procedure: [place];  Surgeon: [name], MD;  Location: [place];  Service: Urology;  Laterality: N/A;   CYSTOSCOPY WITH FULGERATION Bilateral 06/17/2020   Procedure: CYSTOSCOPY,BILATERAL RETROGRADE, CLOT EVACUATION WITH FULGERATION OF THE BLADDER;  Surgeon: Janith Lima, MD;  Location: [place];  Service: Urology;  Laterality: Bilateral;   CYSTOSCOPY WITH FULGERATION N/A 06/28/2020   Procedure: CYSTOSCOPY WITH CLOT EVACUATION AND FULGERATION OF BLEEDERS;  Surgeon: [name], MD;  Location: [place];  Service: Urology;  Laterality: N/A;  1 HR   I & D EXTREMITY Right 12/15/2018   Procedure: DEBRIDEMENT RIGHT FOOT;  Surgeon: [name], MD;  Location: [place];  Service: Orthopedics;  Laterality: Right;   I & D EXTREMITY Right 08/27/2019    Procedure: PARTIAL CUBOID EXCISION RIGHT FOOT;  Surgeon: [name], MD;  Location: [place];  Service: Orthopedics;  Laterality: Right;   I & D EXTREMITY Right 01/07/2020   Procedure: RIGHT FOOT EXCISION INFECTED BONE;  Surgeon: [name], MD;  Location: [place];  Service: Orthopedics;  Laterality: Right;   NECK SURGERY     novemver 2019   STUMP REVISION Left 06/27/2021   Procedure: REVISION LEFT BELOW KNEE AMPUTATION;  Surgeon: [name], MD;  Location: [place];  Service: Orthopedics;  Laterality: Left;   TRANSURETHRAL RESECTION OF PROSTATE  N/A 06/28/2020   Procedure: TRANSURETHRAL RESECTION OF THE PROSTATE (TURP);  Surgeon: [name], MD;  Location: [place];  Service: Urology;  Laterality: N/A;   WISDOM TOOTH EXTRACTION     Past Medical History:  Diagnosis Date   Ambulates with cane    straight cane   Anemia    Cervical myelopathy ([place]) 02/06/2018   Chronic kidney disease    dailysis M W F- home   Complication of anesthesia    Coronary artery disease    Diabetes mellitus without complication ([name])    type2   Diabetic foot ulcer ([place])    Diabetic neuropathy ([place]) 02/06/2018   Gait abnormality 08/24/2018   GERD (gastroesophageal reflux disease)     01/06/20- not current   Gout    History of blood transfusion    History of blood transfusion    History of kidney stones    passed stones   Hypercholesteremia    Hypertension    Hypothyroidism    Neuromuscular disorder (HCC)    neuropathy left leg and bilateral feet   Neuropathy    PONV (postoperative nausea and vomiting)    Prostate cancer ([place])    PVD (peripheral vascular disease) ([place])    with amputations   There were no vitals taken for this visit.  Opioid Risk Score:   Fall Risk Score:  `1  Depression screen [facility] 2/9     09/25/2021   11:00 AM 02/28/2020    3:07 PM 01/10/2020    4:21 PM 06/20/2016    8:18 AM  Depression screen PHQ 2/9  Decreased Interest 1 0 0 0  Down, Depressed, Hopeless 3 0 0 0  PHQ  - 2 Score 4 0 0 0  Altered sleeping 3     Tired, decreased energy 3     Change in appetite 3     Feeling bad or failure about yourself  0     Trouble concentrating 3     Moving slowly or fidgety/restless 3     Suicidal thoughts 0     PHQ-9 Score 19       Review of Systems  Constitutional:  Positive for appetite change.       Loss of taste  Musculoskeletal:  Positive for gait problem.  Neurological:  Positive for tremors and numbness.       Spasms  Psychiatric/Behavioral:         Depression  All other systems reviewed and are negative.      Objective:   Physical Exam  Not performed      Assessment & Plan:  1) L BKA -prescribed PT  2) Cognitive deficits -discussed that recommended brand of fish oil was [place]ita -continue modafinil as he has experienced great improvements with this --discussed MRI results that show mild atrophy and small vessel changes  6 minutes spent in discussing MRI Brain results, discussion of improvements with modafinil, continuing modafinil

## 2021-11-01 ENCOUNTER — Encounter: Payer: Self-pay | Admitting: Orthopedic Surgery

## 2021-11-01 ENCOUNTER — Ambulatory Visit (INDEPENDENT_AMBULATORY_CARE_PROVIDER_SITE_OTHER): Payer: Medicare Other | Admitting: Orthopedic Surgery

## 2021-11-01 DIAGNOSIS — S88112A Complete traumatic amputation at level between knee and ankle, left lower leg, initial encounter: Secondary | ICD-10-CM

## 2021-11-01 DIAGNOSIS — M79671 Pain in right foot: Secondary | ICD-10-CM

## 2021-11-01 DIAGNOSIS — I2510 Atherosclerotic heart disease of native coronary artery without angina pectoris: Secondary | ICD-10-CM

## 2021-11-01 DIAGNOSIS — Z89512 Acquired absence of left leg below knee: Secondary | ICD-10-CM

## 2021-11-01 NOTE — Progress Notes (Signed)
Office Visit Note   Patient: [name]           Date of Birth: 1950/09/26           MRN: [id number] Visit Date: 11/01/2021              Requested by: Lorenda Hatchet, FNP [id number] [place],  [place] [address] PCP: Lorenda Hatchet, FNP  Chief Complaint  Patient presents with   Left Leg - Routine Post Op    06/27/2021 revision left BKA         HPI: Patient is a 71 year old gentleman who presents 4 months status post revision left transtibial amputation.  Patient states he is getting his prosthesis next week.  Patient is undergoing new bracing for the right lower extremity to facilitate ambulation.  Assessment & Plan: Visit Diagnoses:  1. Below-knee amputation of left lower extremity (HCC)   2. Pain in right foot     Plan: We will set patient up with physical therapy upstairs for gait training he will follow-up with [employer] for a new double upright brace and shoe with a T-strap for the right lower extremity  Follow-Up Instructions: Return in about 3 months (around 02/01/2022).   Ortho Exam  Patient is alert, oriented, no adenopathy, well-dressed, normal affect, normal respiratory effort. Examination the transtibial amputation of the left is well-healed he still lacks about 10 degrees to full extension has good flexion there is no redness no cellulitis no open wounds or ulcers.  Right foot has a cavovarus deformity currently with a double upright brace and a soft shoe.  Imaging: No results found. No images are attached to the encounter.  Labs: Lab Results  Component Value Date   HGBA1C 5.5 05/04/2021   HGBA1C 6.3 (H) 02/02/2021   HGBA1C 5.4 09/13/2020   ESRSEDRATE 34 (H) 01/24/2020   ESRSEDRATE 41 (H) 01/10/2020   ESRSEDRATE 55 (H) 12/27/2019   CRP 8.8 (H) 01/24/2020   CRP 35.9 (H) 01/10/2020   CRP 77.9 (H) 12/27/2019   REPTSTATUS 08/23/2021 FINAL 08/18/2021   GRAMSTAIN  06/27/2021    RARE WBC PRESENT, PREDOMINANTLY MONONUCLEAR NO  ORGANISMS SEEN    CULT  08/18/2021    NO GROWTH 5 DAYS Performed at [facility], [place] [address] [place], [place] [address]    LABORGA PSEUDOMONAS AERUGINOSA 06/27/2021     Lab Results  Component Value Date   ALBUMIN 3.3 (L) 08/20/2021   ALBUMIN 3.7 08/19/2021   ALBUMIN 4.0 08/18/2021   PREALBUMIN 32.7 05/09/2021   PREALBUMIN 14.8 (L) 12/14/2018    Lab Results  Component Value Date   MG 2.4 05/14/2021   MG 1.9 05/09/2021   MG 2.0 06/28/2020   Lab Results  Component Value Date   VD25OH 16.13 (L) 05/09/2021    Lab Results  Component Value Date   PREALBUMIN 32.7 05/09/2021   PREALBUMIN 14.8 (L) 12/14/2018      Latest Ref Rng & Units 08/20/2021    1:44 AM 08/19/2021    6:10 AM 08/18/2021    8:34 PM  CBC EXTENDED  WBC 4.0 - 10.5 K/uL 4.7  6.3  6.3   RBC 4.22 - 5.81 MIL/uL 3.18  3.45  4.01   Hemoglobin 13.0 - 17.0 g/dL 9.0  9.7  11.6   HCT 39.0 - 52.0 % 27.8  31.1  35.0   Platelets 150 - 400 K/uL 82  83  113   NEUT# 1.7 - 7.7 K/uL  5.0   Lymph# 0.7 - 4.0 K/uL   0.3      There is no height or weight on file to calculate BMI.  Orders:  No orders of the defined types were placed in this encounter.  No orders of the defined types were placed in this encounter.    Procedures: No procedures performed  Clinical Data: No additional findings.  ROS:  All other systems negative, except as noted in the HPI. Review of Systems  Objective: Vital Signs: There were no vitals taken for this visit.  Specialty Comments:  No specialty comments available.  PMFS History: Patient Active Problem List   Diagnosis Date Noted   Community acquired pneumonia of left lower lobe of lung 08/18/2021   Paroxysmal atrial fibrillation ([place]) 08/18/2021   Anemia of chronic renal failure 08/18/2021   Thrombocytopenia ([place]) 63/87/5643   Acute metabolic encephalopathy 32/95/1884   Action induced myoclonus 08/18/2021   Dehiscence of amputation stump of left lower extremity  ([place])    Left below-knee amputee ([place]) 05/11/2021   S/P BKA (below knee amputation) unilateral, left ([place]) 05/09/2021   Partial nontraumatic amputation of foot, left ([place]) 02/20/2021   Abscess of bursa of left foot 02/03/2021   Cutaneous abscess of left foot    Acute osteomyelitis of metatarsal bone of left foot (HCC)    Symptomatic anemia 09/13/2020   COVID-19 virus infection 09/13/2020   Pressure injury of skin 06/29/2020   Gross hematuria 06/16/2020   Typical atrial flutter ([place]) 03/24/2020   Bilateral pleural effusion 02/24/2020   Healthcare maintenance 01/28/2020   Shortness of breath 01/28/2020   History of partial ray amputation of fourth toe of right foot ([place]) 09/08/2019   Ulcerated, foot, right, with necrosis of bone (HCC)    Chronic cough 06/25/2019   Cutaneous abscess of right foot    Subacute osteomyelitis of right foot ([place]) 12/14/2018   AKI (acute kidney injury) ([place]) 12/14/2018   ESRD on hemodialysis ([place]) 12/14/2018   Gait abnormality 08/24/2018   Diabetic neuropathy ([place]) 02/06/2018   Cervical myelopathy ([place]) 02/06/2018   Onychomycosis 10/30/2017   Other spondylosis with radiculopathy, cervical region 01/27/2017   Midfoot ulcer, right, limited to breakdown of skin ([place]) 11/15/2016   Lateral epicondylitis, left elbow 08/12/2016   Cellulitis of fifth toe of right foot 07/29/2016   Prostate cancer ([place]) 06/19/2016   Non-pressure chronic ulcer of other part of right foot limited to breakdown of skin ([place]) 04/04/2016   Cellulitis of leg, right 12/24/2015   Cellulitis of right lower extremity 12/24/2015   Gout 09/14/2012   Hypertension associated with diabetes ([place]) 09/14/2012   Hypothyroidism 09/14/2012   CAD (coronary artery disease) 09/14/2012   Insulin dependent type 2 diabetes mellitus ([place]) 09/14/2012   Hyperlipidemia associated with type 2 diabetes mellitus ([place])    Neuropathy ([place])    Past Medical History:  Diagnosis Date   Ambulates with cane     straight cane   Anemia    Cervical myelopathy ([place]) 02/06/2018   Chronic kidney disease    dailysis [facility] F- home   Complication of anesthesia    Coronary artery disease    Diabetes mellitus without complication ([place])    type2   Diabetic foot ulcer ([place])    Diabetic neuropathy ([place]) 02/06/2018   Gait abnormality 08/24/2018   GERD (gastroesophageal reflux disease)     01/06/20- not current   Gout    History of blood transfusion    History of blood transfusion  History of kidney stones    passed stones   Hypercholesteremia    Hypertension    Hypothyroidism    Neuromuscular disorder ([place])    neuropathy left leg and bilateral feet   Neuropathy    PONV (postoperative nausea and vomiting)    Prostate cancer (HCC)    PVD (peripheral vascular disease) (HCC)    with amputations    Family History  Problem Relation Age of Onset   Diabetes Mellitus II Mother    Kidney disease Mother    Diabetes Mellitus II Father    CAD Father    Cancer Father        prostate   Kidney disease Father    Diabetes Mellitus II Brother    Kidney disease Brother    Diabetes Mellitus II Brother    Stomach cancer Brother 90   Kidney disease Brother    Colon cancer Neg Hx    Colon polyps Neg Hx    Esophageal cancer Neg Hx    Rectal cancer Neg Hx    Pancreatic cancer Neg Hx     Past Surgical History:  Procedure Laterality Date   A-FLUTTER ABLATION N/A 04/06/2020   Procedure: A-FLUTTER ABLATION;  Surgeon: [name], MD;  Location: [facility];  Service: Cardiovascular;  Laterality: N/A;   AMPUTATION Left 12/25/2013   Procedure: AMPUTATION RAY LEFT 5TH RAY;  Surgeon: [name], MD;  Location: [facility];  Service: Orthopedics;  Laterality: Left;   AMPUTATION Right 12/15/2018   Procedure: AMPUTATION OF 4TH AND 5TH TOES RIGHT FOOT;  Surgeon: [name], MD;  Location: [facility];  Service: Orthopedics;  Laterality: Right;   AMPUTATION Left 02/02/2021   Procedure: LEFT FOOT 4TH RAY AMPUTATION;   Surgeon: [name], MD;  Location: [facility];  Service: Orthopedics;  Laterality: Left;   AMPUTATION Left 05/09/2021   Procedure: LEFT BELOW KNEE AMPUTATION;  Surgeon: [name], MD;  Location: [facility];  Service: Orthopedics;  Laterality: Left;   APPLICATION OF WOUND VAC Right 12/15/2018   Procedure: APPLICATION OF WOUND VAC;  Surgeon: [name], MD;  Location: [facility];  Service: Orthopedics;  Laterality: Right;   AV FISTULA PLACEMENT Left 03/25/2019   Procedure: LEFT ARM ARTERIOVENOUS (AV) FISTULA CREATION;  Surgeon: [name], MD;  Location: [facility];  Service: Vascular;  Laterality: Left;   [place] Left 05/20/2019   Procedure: SECOND STAGE LEFT BASCILIC VEIN TRANSPOSITION;  Surgeon: [name], MD;  Location: [facility];  Service: Vascular;  Laterality: Left;   CARDIAC CATHETERIZATION  02/17/2014   CHOLECYSTECTOMY     COLONOSCOPY  2011   in [place], [place]  2008   CYSTOSCOPY N/A 06/29/2020   Procedure: [place];  Surgeon: [name], MD;  Location: [facility];  Service: Urology;  Laterality: N/A;   CYSTOSCOPY WITH FULGERATION Bilateral 06/17/2020   Procedure: CYSTOSCOPY,BILATERAL RETROGRADE, CLOT EVACUATION WITH FULGERATION OF THE BLADDER;  Surgeon: Janith Lima, MD;  Location: [facility];  Service: Urology;  Laterality: Bilateral;   CYSTOSCOPY WITH FULGERATION N/A 06/28/2020   Procedure: CYSTOSCOPY WITH CLOT EVACUATION AND FULGERATION OF BLEEDERS;  Surgeon: [name], MD;  Location: [facility];  Service: Urology;  Laterality: N/A;  1 HR   I & D EXTREMITY Right 12/15/2018   Procedure: DEBRIDEMENT RIGHT FOOT;  Surgeon: [name], MD;  Location: [facility];  Service: Orthopedics;  Laterality: Right;   I & D EXTREMITY Right 08/27/2019   Procedure: PARTIAL CUBOID EXCISION RIGHT FOOT;  Surgeon: [name], MD;  Location: [facility];  Service: Orthopedics;  Laterality: Right;   I  & D EXTREMITY Right 01/07/2020   Procedure: RIGHT FOOT EXCISION INFECTED BONE;  Surgeon: [name], MD;  Location: [facility];  Service: Orthopedics;  Laterality: Right;   NECK SURGERY     novemver 2019   STUMP REVISION Left 06/27/2021   Procedure: REVISION LEFT BELOW KNEE AMPUTATION;  Surgeon: [name], MD;  Location: [facility];  Service: Orthopedics;  Laterality: Left;   TRANSURETHRAL RESECTION OF PROSTATE N/A 06/28/2020   Procedure: TRANSURETHRAL RESECTION OF THE PROSTATE (TURP);  Surgeon: [name], MD;  Location: [facility];  Service: Urology;  Laterality: N/A;   WISDOM TOOTH EXTRACTION     Social History   Occupational History   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  Tobacco Use   Smoking status: Former    Types: Cigars    Start date: 04/08/1973    Quit date: 12/24/1988    Years since quitting: 32.8   Smokeless tobacco: Never   Tobacco comments:    Cigars and Pipe   Vaping Use   Vaping Use: Never used  Substance and Sexual Activity   Alcohol use: Never   Drug use: Never   Sexual activity: Yes    Partners: Female

## 2021-11-02 ENCOUNTER — Ambulatory Visit: Payer: BLUE CROSS/BLUE SHIELD | Admitting: Cardiology

## 2021-11-02 ENCOUNTER — Ambulatory Visit: Payer: Self-pay

## 2021-11-02 NOTE — Patient Outreach (Signed)
  Care Coordination   Initial Visit Note   11/02/2021 Name: TEGAN BURNSIDE MRN: [id number] DOB: 1950-10-16  [name] is a 71 y.o. year old male who sees Lorenda Hatchet, [place] for primary care. I spoke with  [name] by phone today  What matters to the patients health and wellness today?  Obtaining new prosthetic    Goals Addressed               This Visit's Progress     Patient Stated     I would like an Advanced Directive (pt-stated)        Care Coordination Interventions: Educated the patient on the importance of an Advanced Directive Determined the patient would like to complete an Clinical research associate the patient an Ecologist Scheduled follow up call to assist with completion      Other     COMPLETED: Complete Colonoscopy        Care Coordination Interventions: Performed chart review to note patient has not had a recent colonoscopy Discussed the patient has had a lot going on and has not had time to schedule Encouraged the patient to schedule a colonoscopy when he is able        SDOH assessments and interventions completed:   Yes SDOH Interventions Today    Flowsheet Row Most Recent Value  SDOH Interventions   Food Insecurity Interventions Intervention Not Indicated  Housing Interventions Intervention Not Indicated  Transportation Interventions Intervention Not Indicated       Care Coordination Interventions Activated:  Yes Care Coordination Interventions:  Yes, provided  Follow up plan: Follow up call scheduled for 8/18  Encounter Outcome:  Pt. Visit Completed  Daneen Schick, BSW, CDP Social Worker, Certified Dementia Practitioner Care Coordination [phone]

## 2021-11-02 NOTE — Patient Instructions (Signed)
Visit Information  Thank you for taking time to visit with me today. Please don't hesitate to contact me if I can be of assistance to you.   Following are the goals we discussed today:   Goals Addressed               This Visit's Progress     Patient Stated     I would like an Advanced Directive (pt-stated)        Care Coordination Interventions: Educated the patient on the importance of an Advanced Directive Determined the patient would like to complete an Clinical research associate the patient an Ecologist Scheduled follow up call to assist with completion      Other     COMPLETED: Complete Colonoscopy        Care Coordination Interventions: Performed chart review to note patient has not had a recent colonoscopy Discussed the patient has had a lot going on and has not had time to schedule Encouraged the patient to schedule a colonoscopy when he is able        Our next appointment is by telephone on 8/18 at 1:15  Please call the care guide team at [phone] if you need to cancel or reschedule your appointment.   If you are experiencing a Mental Health or [place] or need someone to talk to, please call [phone]-TALK (toll free, 24 hour hotline)  Patient verbalizes understanding of instructions and care plan provided today and agrees to view in [place]. Active MyChart status and patient understanding of how to access instructions and care plan via MyChart confirmed with patient.     Telephone follow up appointment with care management team member scheduled for:8/18  Daneen Schick, BSW, CDP Social Worker, Certified Dementia Practitioner Care Coordination [phone]

## 2021-11-05 ENCOUNTER — Other Ambulatory Visit: Payer: Self-pay | Admitting: Physical Medicine and Rehabilitation

## 2021-11-05 MED ORDER — MODAFINIL 200 MG PO TABS
200.0000 mg | ORAL_TABLET | Freq: Every day | ORAL | 3 refills | Status: DC
Start: 2021-11-05 — End: 2022-03-11

## 2021-11-07 ENCOUNTER — Encounter: Payer: Medicare Other | Admitting: Physical Therapy

## 2021-11-13 ENCOUNTER — Encounter: Payer: Self-pay | Admitting: Physical Therapy

## 2021-11-13 ENCOUNTER — Ambulatory Visit (INDEPENDENT_AMBULATORY_CARE_PROVIDER_SITE_OTHER): Payer: Medicare Other | Admitting: Physical Therapy

## 2021-11-13 ENCOUNTER — Other Ambulatory Visit: Payer: Self-pay

## 2021-11-13 DIAGNOSIS — R2681 Unsteadiness on feet: Secondary | ICD-10-CM

## 2021-11-13 DIAGNOSIS — R2689 Other abnormalities of gait and mobility: Secondary | ICD-10-CM

## 2021-11-13 DIAGNOSIS — L98498 Non-pressure chronic ulcer of skin of other sites with other specified severity: Secondary | ICD-10-CM

## 2021-11-13 DIAGNOSIS — R293 Abnormal posture: Secondary | ICD-10-CM

## 2021-11-13 DIAGNOSIS — M6281 Muscle weakness (generalized): Secondary | ICD-10-CM

## 2021-11-13 DIAGNOSIS — M25662 Stiffness of left knee, not elsewhere classified: Secondary | ICD-10-CM

## 2021-11-13 DIAGNOSIS — M79605 Pain in left leg: Secondary | ICD-10-CM

## 2021-11-13 NOTE — Therapy (Addendum)
OUTPATIENT PHYSICAL THERAPY PROSTHETICS EVALUATION   Patient Name: [name] MRN: [id number] DOB:[birth year], 71 y.o., male Today's Date: 11/13/2021  PCP: Lorenda Hatchet, FNP REFERRING PROVIDER: [name], MD   PT End of Session - 11/13/21 1258     Visit Number 1    Number of Visits 25    Date for PT Re-Evaluation 02/07/22    Authorization Type Medicare and BCBS    Progress Note Due on Visit 10    PT Start Time 6294    PT Stop Time 1348    PT Time Calculation (min) 49 min    Equipment Utilized During Treatment Gait belt    Activity Tolerance Patient limited by fatigue;Patient limited by pain    Behavior During Therapy [facility] for tasks assessed/performed             Past Medical History:  Diagnosis Date   Ambulates with cane    straight cane   Anemia    Cervical myelopathy ([place]) 02/06/2018   Chronic kidney disease    dailysis [facility]- home   Complication of anesthesia    Coronary artery disease    Diabetes mellitus without complication ([place])    type2   Diabetic foot ulcer ([place])    Diabetic neuropathy ([place]) 02/06/2018   Gait abnormality 08/24/2018   GERD (gastroesophageal reflux disease)     01/06/20- not current   Gout    History of blood transfusion    History of blood transfusion    History of kidney stones    passed stones   Hypercholesteremia    Hypertension    Hypothyroidism    Neuromuscular disorder ([place])    neuropathy left leg and bilateral feet   Neuropathy    PONV (postoperative nausea and vomiting)    Prostate cancer ([place])    PVD (peripheral vascular disease) ([place])    with amputations   Past Surgical History:  Procedure Laterality Date   A-FLUTTER ABLATION N/A 04/06/2020   Procedure: A-FLUTTER ABLATION;  Surgeon: [name], MD;  Location: [place] [facility];  Service: Cardiovascular;  Laterality: N/A;   AMPUTATION Left 12/25/2013   Procedure: AMPUTATION RAY LEFT 5TH RAY;  Surgeon: [name], MD;  Location: [facility];  Service:  Orthopedics;  Laterality: Left;   AMPUTATION Right 12/15/2018   Procedure: AMPUTATION OF 4TH AND 5TH TOES RIGHT FOOT;  Surgeon: [name], MD;  Location: [place];  Service: Orthopedics;  Laterality: Right;   AMPUTATION Left 02/02/2021   Procedure: LEFT FOOT 4TH RAY AMPUTATION;  Surgeon: [name], MD;  Location: [place];  Service: Orthopedics;  Laterality: Left;   AMPUTATION Left 05/09/2021   Procedure: LEFT BELOW KNEE AMPUTATION;  Surgeon: [name], MD;  Location: [place];  Service: Orthopedics;  Laterality: Left;   APPLICATION OF WOUND VAC Right 12/15/2018   Procedure: APPLICATION OF WOUND VAC;  Surgeon: [name], MD;  Location: [place];  Service: Orthopedics;  Laterality: Right;   AV FISTULA PLACEMENT Left 03/25/2019   Procedure: LEFT ARM ARTERIOVENOUS (AV) FISTULA CREATION;  Surgeon: [name], MD;  Location: [place];  Service: Vascular;  Laterality: Left;   [place] Left 05/20/2019   Procedure: SECOND STAGE LEFT BASCILIC VEIN TRANSPOSITION;  Surgeon: [name], MD;  Location: [place] [facility];  Service: Vascular;  Laterality: Left;   CARDIAC CATHETERIZATION  02/17/2014   CHOLECYSTECTOMY     COLONOSCOPY  2011  in [place], Normal   CORONARY ARTERY BYPASS GRAFT  2008   CYSTOSCOPY N/A 06/29/2020   Procedure: CYSTOSCOPY WITH CLOT EVACUATION AND  FULGERATION;  Surgeon: [name], MD;  Location: [place];  Service: Urology;  Laterality: N/A;   CYSTOSCOPY WITH FULGERATION Bilateral 06/17/2020   Procedure: CYSTOSCOPY,BILATERAL RETROGRADE, CLOT EVACUATION WITH FULGERATION OF THE BLADDER;  Surgeon: Janith Lima, MD;  Location: [place];  Service: Urology;  Laterality: Bilateral;   CYSTOSCOPY WITH FULGERATION N/A 06/28/2020   Procedure: CYSTOSCOPY WITH CLOT EVACUATION AND FULGERATION OF BLEEDERS;  Surgeon: [name], MD;  Location: [place];  Service: Urology;  Laterality: N/A;  1 HR   I & D EXTREMITY Right 12/15/2018   Procedure: DEBRIDEMENT  RIGHT FOOT;  Surgeon: [name], MD;  Location: [place];  Service: Orthopedics;  Laterality: Right;   I & D EXTREMITY Right 08/27/2019   Procedure: PARTIAL CUBOID EXCISION RIGHT FOOT;  Surgeon: [name], MD;  Location: [place];  Service: Orthopedics;  Laterality: Right;   I & D EXTREMITY Right 01/07/2020   Procedure: RIGHT FOOT EXCISION INFECTED BONE;  Surgeon: [name], MD;  Location: [place];  Service: Orthopedics;  Laterality: Right;   NECK SURGERY     novemver 2019   STUMP REVISION Left 06/27/2021   Procedure: REVISION LEFT BELOW KNEE AMPUTATION;  Surgeon: [name], MD;  Location: [place];  Service: Orthopedics;  Laterality: Left;   TRANSURETHRAL RESECTION OF PROSTATE N/A 06/28/2020   Procedure: TRANSURETHRAL RESECTION OF THE PROSTATE (TURP);  Surgeon: [name], MD;  Location: [place];  Service: Urology;  Laterality: N/A;   WISDOM TOOTH EXTRACTION     Patient Active Problem List   Diagnosis Date Noted   Community acquired pneumonia of left lower lobe of lung 08/18/2021   Paroxysmal atrial fibrillation ([place]) 08/18/2021   Anemia of chronic renal failure 08/18/2021   Thrombocytopenia ([place]) 68/34/1962   Acute metabolic encephalopathy 22/97/9892   Action induced myoclonus 08/18/2021   Dehiscence of amputation stump of left lower extremity ([place])    Left below-knee amputee ([place]) 05/11/2021   S/P BKA (below knee amputation) unilateral, left ([place]) 05/09/2021   Partial nontraumatic amputation of foot, left ([place]) 02/20/2021   Abscess of bursa of left foot 02/03/2021   Cutaneous abscess of left foot    Acute osteomyelitis of metatarsal bone of left foot (H[facility])    Symptomatic anemia 09/13/2020   COVID-19 virus infection 09/13/2020   Pressure injury of skin 06/29/2020   Gross hematuria 06/16/2020   Typical atrial flutter ([place]) 03/24/2020   Bilateral pleural effusion 02/24/2020   Healthcare maintenance 01/28/2020   Shortness of breath 01/28/2020   History of partial ray  amputation of fourth toe of right foot ([place]) 09/08/2019   Ulcerated, foot, right, with necrosis of bone (H[facility])    Chronic cough 06/25/2019   Cutaneous abscess of right foot    Subacute osteomyelitis of right foot ([place]) 12/14/2018   AKI (acute kidney injury) ([place]) 12/14/2018   ESRD on hemodialysis ([place]) 12/14/2018   Gait abnormality 08/24/2018   Diabetic neuropathy ([place]) 02/06/2018   Cervical myelopathy ([place]) 02/06/2018   Onychomycosis 10/30/2017   Other spondylosis with radiculopathy, cervical region 01/27/2017   Midfoot ulcer, right, limited to breakdown of skin ([place]) 11/15/2016   Lateral epicondylitis, left elbow 08/12/2016   Cellulitis of fifth toe of right foot 07/29/2016   Prostate cancer ([place]) 06/19/2016   Non-pressure chronic ulcer of other part of right foot limited to  breakdown of skin ([place]) 04/04/2016   Cellulitis of leg, right 12/24/2015   Cellulitis of right lower extremity 12/24/2015   Gout 09/14/2012   Hypertension associated with diabetes ([place]) 09/14/2012   Hypothyroidism 09/14/2012   CAD (coronary artery disease) 09/14/2012   Insulin dependent type 2 diabetes mellitus ([name]) 09/14/2012   Hyperlipidemia associated with type 2 diabetes mellitus ([place])    Neuropathy ([place])     ONSET DATE: 11/07/21 Received prosthesis  REFERRING DIAG: Z61.096E (ICD-10-CM) - Below-knee amputation of left lower extremity  THERAPY DIAG:  Unsteadiness on feet  Other abnormalities of gait and mobility  Abnormal posture  Muscle weakness (generalized)  Pain in left leg  Non-pressure chronic ulcer of skin of other sites with other specified severity ([place])  Stiffness of left knee, not elsewhere classified  Rationale for Evaluation and Treatment Rehabilitation  SUBJECTIVE:   SUBJECTIVE STATEMENT: Pt received Lt BKA 06/27/2021 and prosthesis on 11/07/21. Pt wears prosthesis 30 min-1 hour once daily on days with dialysis and twice on days without. He wears shrinker constantly when not  wearing prosthesis. When standing, there is pain around residual limb and anterior shin that feels like pressure. He has worn the RLE upright brace for a couple years with a new shoe that fits the brace. Dialysis from wife at home LUE Sunday, Wednesday, Friday for 4 hours 12 minutes in the morning/midday. He has been on dialysis for about 5 years. He feels worn after but not too bad, and prior to amputation he would sleep a lot after but could still function.  Pt accompanied by:  caregiver, Thedore Mins  PERTINENT HISTORY: braced right foot with cavovarus collapse, CAP without hypoxia, acute metabolic encephalopathy, ESRD on HD Sun/W/F, afib s/p ablation, DM II, neuropathy, HTN, HLD, hypothyroidism, anemia, prostate cancer s/p TUPP. cervical myelopathy, gout, CAD w/CABG 2005   PAIN:  Are you having pain? No   PRECAUTIONS: Other: no BP in LUE, fall risk   WEIGHT BEARING RESTRICTIONS No  FALLS: Has patient fallen in last 6 months? No  LIVING ENVIRONMENT: Lives with: lives with their spouse and 3 large dogs Lives in: House/apartment Home Access: Ramped entrance and ramp over single step to den on main level Home layout: Multi level and Able to live on main level with bedroom and bathroom Stairs: present but stays on one level Has following equipment at home: Lobbyist, Environmental consultant - 2 wheeled, Environmental consultant - 4 wheeled, Wheelchair (manual), [employer], Grab bars, and Ramped entry  PLOF: Independent with household mobility with device, Independent with community mobility with device, and Needs assistance with ADLs used a cane prior to below knee amputation, has needed assistance with hygiene ADLs for about a year now  PATIENT GOALS  walking, using walker or cane  OBJECTIVE:   COGNITION: Overall cognitive status: Within functional limits for tasks assessed   SENSATION: Not tested  POSTURE: rounded shoulders, forward head, and anterior pelvic tilt - RLE resting position with tibial IR,  severe foot supination and inversion with cavovarus collapse  LOWER EXTREMITY ROM:  ROM (A: AROM, P: PROM) Right eval Left eval  Hip flexion    Hip extension    Hip abduction    Hip adduction    Hip internal rotation    Hip external rotation    Knee flexion    Knee extension  Standing with RW and prosthesis  A: -16*  Ankle dorsiflexion    Ankle plantarflexion    Ankle inversion    Ankle eversion     (  Blank rows = not tested)  LOWER EXTREMITY MMT:  MMT Right eval Left eval  Hip flexion    Hip extension    Hip abduction    Hip adduction    Hip internal rotation    Hip external rotation    Knee flexion    Knee extension    Ankle dorsiflexion    Ankle plantarflexion    Ankle inversion    Ankle eversion    (Blank rows = not tested)  Functional strength with standing and transfers 3- at hip and knee bil., with no motion at Rt ankle  TRANSFERS: Sit to stand: minA - fell backwards into chair upon first attempt, MinA for next attempt with RW stabilization and verbal cueing for foot placement Stand to sit: minA for controlling descent and stabilization  GAIT: Gait pattern:  adduction RLE with toe-in, step to pattern, decreased step length- Right, decreased stance time- Right, decreased stride length, decreased hip/knee flexion- Left, decreased ankle dorsiflexion- Right, Left hip hike, Right foot flat, knee flexed in stance- Right, knee flexed in stance- Left, lateral hip instability, lateral lean- Right, trunk flexed, and poor foot clearance- Right Distance walked: 35' Assistive device utilized: Environmental consultant - 2 wheeled & Left TTA prosthesis, right double upright AFO custom shoe Level of assistance: MinA to recover losses of balance with straight surface walking, ModA to recover losses of balance turning Lt, MaxA with loss of balance during turning Rt Comments: Rt lower leg positioning reduced foot clearance with catching Lt heel most steps, and promoted Rt lateral  leaning/lateral loss of balance. Requires excessive BUE support on RW.  FUNCTIONAL TESTs:  Berg Balance Scale: 6/56, 13/56 with RW  [facility] PT Assessment - 11/13/21 0001       Standardized Balance Assessment   Standardized Balance Assessment Berg Balance Test      Berg Balance Test   Sit to Stand Needs moderate or maximal assist to stand   Berg task with RW = 1   Standing Unsupported Unable to stand 30 seconds unassisted   Berg task with RW = 2   Sitting with Back Unsupported but Feet Supported on Floor or Stool Able to sit safely and securely 2 minutes    Stand to Sit Needs assistance to sit   Berg task with RW = 2   Transfers Able to transfer with verbal cueing and /or supervision    Standing Unsupported with Eyes Closed Needs help to keep from falling   Berg task with RW = 0   Standing Unsupported with Feet Together Needs help to attain position and unable to hold for 15 seconds   Berg task with RW = 0   From Standing, Reach Forward with Outstretched Arm Loses balance while trying/requires external support   Berg task with RW = 1 (CGA reaching with Lt)   From Standing Position, Pick up Object from Floor Unable to try/needs assist to keep balance   Berg task with RW = 0   From Standing Position, Turn to Look Behind Over each Shoulder Needs assist to keep from losing balance and falling   Berg task with RW = 0   Turn 360 Degrees Needs assistance while turning   Berg task with RW = 0   Standing Unsupported, Alternately Place Feet on Step/Stool Needs assistance to keep from falling or unable to try   Berg task with RW = 0   Standing Unsupported, One Foot in [employer] balance while stepping or standing   Berg task  with RW = 0   Standing on One Leg Unable to try or needs assist to prevent fall   Berg task with RW = 1   Total Score 6    Berg comment: Berg tasks with RW = 13/56              CURRENT PROSTHETIC WEAR ASSESSMENT: Patient is dependent with: skin check, residual limb care,  care of non-amputated limb, prosthetic cleaning, ply sock cleaning, correct ply sock adjustment, proper wear schedule/adjustment, and proper weight-bearing schedule/adjustment Donning prosthesis: Min A and maximal cueing Doffing prosthesis: SBA Prosthetic wear tolerance: 30 minutes 1-2x/day 6 of 6 days since delivery Prosthetic weight bearing tolerance: stood ~5 minutes with c/o tibial crest pain moderate level after weight bearing Edema: present with slow capillary refill Residual limb condition: 1.6 cm scab along incision, cylinderical shape, normal color & temperature, mildly dry skin, slight scar adhesion at distal tibia,  Prosthetic description: silicone liner with pin lock suspension, 2nd pelite foam liner, total contact socket, SACH foot   TODAY'S TREATMENT:  11/13/21 PATIENT EDUCATED ON FOLLOWING PROSTHETIC CARE: Prosthetic wear tolerance: 2 hours 2x/day, 7 days/week Other education  Residual limb care, Prosthetic cleaning, Ply sock cleaning, Correct ply sock adjustment, [place] donning, Proper doffing, Proper wear schedule/adjustment, and Comments positioning in sitting Person educated: Patient and Research officer, trade union Education method: Explanation, Demonstration, Tactile cues, and Verbal cues Education comprehension: verbalized understanding, returned demonstration, verbal cues required, tactile cues required, and needs further education   HOME EXERCISE PROGRAM:   ASSESSMENT:  CLINICAL IMPRESSION: Patient is a 71 y.o. male who was seen today for physical therapy evaluation and treatment for prosthetic training s/p Lt BKA 06/27/21 and prosthesis received 11/07/21.He is dependent in prosthetic care and has a small scab along incision and residual limb pain with standing which limits safe use of prosthesis. He currently wears the prosthesis 1/2 to 1 hour once or twice daily which limits function during his awake hours. The Berg balance score of 13/56 with RW and 6/56 without RW support  indicating high fall risk even with BUE support. He presents with deviations and multiple losses of balance  requiring assistance during transfers and ambulation with RW. He will benefit from skilled PT to address prosthesis training and functional limitations.   OBJECTIVE IMPAIRMENTS Abnormal gait, decreased activity tolerance, decreased balance, decreased coordination, decreased endurance, decreased knowledge of use of DME, decreased mobility, difficulty walking, decreased ROM, decreased strength, decreased safety awareness, increased edema, impaired flexibility, postural dysfunction, prosthetic dependency , and pain.   ACTIVITY LIMITATIONS bending, sitting, standing, squatting, stairs, transfers, bathing, dressing, hygiene/grooming, locomotion level, and prosthetic use  PARTICIPATION LIMITATIONS: cleaning, laundry, and community activity  PERSONAL FACTORS Fitness, Time since onset of injury/illness/exacerbation, and 3+ comorbidities: see PMH  are also affecting patient's functional outcome.   REHAB POTENTIAL: Good  CLINICAL DECISION MAKING: Evolving/moderate complexity  EVALUATION COMPLEXITY: Moderate   GOALS: Goals reviewed with patient? Yes  SHORT TERM GOALS: Target date: 12/14/21  Patient independent with donning/doffing prosthesis. Baseline: see objective data Goal status: INITIAL  2.  Patient verbalize proper assessment of wound with prosthetic use.  Baseline: see objective data Goal status: INITIAL  3.  Patient wears prosthesis >/= 8 hours/day daily without increase in wound & limb pain </= 5/10 to facilitate functional mobility and safety with transfers. Baseline: see objective data Goal status: INITIAL  4.  Patient able to transfer sit to/from stand chair with armrests with RW with supervision. Baseline: see objective data Goal status: INITIAL  5.  Patient ambulates 20' with RW & prosthesis / AFO with minA including negotiating around obstacles.  Baseline: see  objective data Goal status: INITIAL   LONG TERM GOALS: Target date: 02/07/22  Patient wears prosthesis > 90% of waking hours with no limb pain or skin issues to promote function and safety. Baseline: see objective data Goal status: INITIAL  2.  Patient independent with all aspects of prosthetic care to enable safe utilization of prosthesis. Baseline: see objective data Goal status: INITIAL  3.  Patient ambulates 53' with LRAD safely and modified independent to allow for household mobility. Baseline: see objective data Goal status: INITIAL  4.  Patient able to ambulate 200' and navigate ramp and curb with LRAD safely with minA/ caregiver assist to allow for community mobility. Baseline: see objective data Goal status: INITIAL  5.  Berg balance score >/= 30/56 with RW to indicate low fall risk during household level balance requirements with UE support. Baseline: see objective data Goal status: INITIAL   PLAN: PT FREQUENCY: 2x/week  PT DURATION: other: 90 days  PLANNED INTERVENTIONS: Therapeutic exercises, Therapeutic activity, Neuromuscular re-education, Balance training, Gait training, Patient/Family education, Self Care, Stair training, Vestibular training, Orthotic/Fit training, Prosthetic training, DME instructions, Moist heat, scar mobilization, Manual therapy, Re-evaluation, and physical performance tests  PLAN FOR NEXT SESSION: ask about prosthetic wear tolerance and WB time, review limb care and donning/doffing as indicated, Lt knee stretches for extension range and establish sink HEP with standing balance   Jana Hakim, Student-PT 11/13/2021, 5:26 PM  This entire session of physical therapy was performed under the direct supervision of PT signing evaluation /treatment. PT reviewed note and agrees.  Jamey Reas, PT, DPT 11/13/2021, 5:27 PM

## 2021-11-19 ENCOUNTER — Encounter
Payer: Medicare Other | Attending: Physical Medicine and Rehabilitation | Admitting: Physical Medicine and Rehabilitation

## 2021-11-19 VITALS — BP 150/70 | HR 52

## 2021-11-19 DIAGNOSIS — G629 Polyneuropathy, unspecified: Secondary | ICD-10-CM

## 2021-11-19 DIAGNOSIS — E559 Vitamin D deficiency, unspecified: Secondary | ICD-10-CM | POA: Diagnosis present

## 2021-11-19 DIAGNOSIS — R5383 Other fatigue: Secondary | ICD-10-CM | POA: Diagnosis present

## 2021-11-19 DIAGNOSIS — R400 Somnolence: Secondary | ICD-10-CM | POA: Diagnosis present

## 2021-11-19 MED ORDER — VITAMIN D (ERGOCALCIFEROL) 1.25 MG (50000 UNIT) PO CAPS: 50000.0000 [IU] | ORAL_CAPSULE | ORAL | 3 refills | Status: DC

## 2021-11-19 NOTE — Addendum Note (Signed)
Addended by: [name] on: 11/19/2021 01:57 PM   Modules accepted: Level of Service

## 2021-11-19 NOTE — Progress Notes (Signed)
Subjective:    Patient ID: [name], male    DOB: 1950-06-13, 71 y.o.   MRN: [id number]  HPI [name] is a 71 year old man who presents for f/u of BKA and cognitive deficits  1) Left BKA -he is starting rehab tues and thurs at [facility]. [facility]  -he is working with a Clinical research associate on mon and wed to work on Veterinary surgeon  -he would like to keep to keep up with his physical conditioning  2) Cognitive deficits -he has noted great improvements with modafinil and asks if he can continue this -he asks about MRI results -he has been taking fish oil supplement  3) Neuropathy -he has pain in his right heel- it was present prior to his development of diabetes -it is burning -he would like to try Qutenza today  4) Afib:  -not being treated for afib due to his comorbidities   Pain Inventory Average Pain 1 Pain Right Now 1 My pain is intermittent and tingling  In the last 24 hours, has pain interfered with the following? General activity 0 Relation with others 0 Enjoyment of life 0 What TIME of day is your pain at its worst? morning , daytime, evening, and night Sleep (in general)  sleeping too much  Pain is worse with: unsure Pain improves with:  It does not bother me much. Relief from Meds:  none thing taken  ability to climb steps?  no do you drive?  no use a wheelchair transfers alone Do you have any goals in this area?  yes  employed # of hrs/week Two hours per week. Self employed.  retired I need assistance with the following:  dressing, bathing, toileting, meal prep, household duties, and shopping Do you have any goals in this area?  yes  numbness tremor trouble walking spasms depression loss of taste or smell  Any changes since last visit?  yes, [facility]  Any changes since last visit?  no    Family History  Problem Relation Age of Onset   Diabetes Mellitus II Mother    Kidney disease Mother    Diabetes Mellitus II Father    CAD Father     Cancer Father        prostate   Kidney disease Father    Diabetes Mellitus II Brother    Kidney disease Brother    Diabetes Mellitus II Brother    Stomach cancer Brother 39   Kidney disease Brother    Colon cancer Neg Hx    Colon polyps Neg Hx    Esophageal cancer Neg Hx    Rectal cancer Neg Hx    Pancreatic cancer Neg Hx    Social History   Socioeconomic History   Marital status: Married    Spouse name: Not on file   Number of children: 2   Years of education: Not on file   Highest education level: Not on file  Occupational History   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  Tobacco Use   Smoking status: Former    Types: Cigars    Start date: 04/08/1973    Quit date: 12/24/1988    Years since quitting: 32.9   Smokeless tobacco: Never   Tobacco comments:    Cigars and Pipe   Vaping Use   Vaping Use: Never used  Substance and Sexual Activity   Alcohol use: Never   Drug use: Never   Sexual activity: Yes    Partners: Female  Other Topics  Concern   Not on file  Social History Narrative   Regular exercise: yes 3 times a week   Caffeine use: hot tea   Social Determinants of Health   Financial Resource Strain: Not on file  Food Insecurity: No Food Insecurity (11/02/2021)   Hunger Vital Sign    Worried About Running Out of Food in the Last Year: Never true    Ran Out of Food in the Last Year: Never true  Transportation Needs: No Transportation Needs (11/02/2021)   PRAPARE - Hydrologist (Medical): No    Lack of Transportation (Non-Medical): No  Physical Activity: Not on file  Stress: Not on file  Social Connections: Not on file   Past Surgical History:  Procedure Laterality Date   A-FLUTTER ABLATION N/A 04/06/2020   Procedure: A-FLUTTER ABLATION;  Surgeon: [name], MD;  Location: [facility];  Service: Cardiovascular;  Laterality: N/A;   AMPUTATION Left 12/25/2013   Procedure: AMPUTATION RAY LEFT 5TH RAY;  Surgeon: [name], MD;  Location: [facility];  Service: Orthopedics;  Laterality: Left;   AMPUTATION Right 12/15/2018   Procedure: AMPUTATION OF 4TH AND 5TH TOES RIGHT FOOT;  Surgeon: [name], MD;  Location: [place];  Service: Orthopedics;  Laterality: Right;   AMPUTATION Left 02/02/2021   Procedure: LEFT FOOT 4TH RAY AMPUTATION;  Surgeon: [name], MD;  Location: [place];  Service: Orthopedics;  Laterality: Left;   AMPUTATION Left 05/09/2021   Procedure: LEFT BELOW KNEE AMPUTATION;  Surgeon: [name], MD;  Location: [place];  Service: Orthopedics;  Laterality: Left;   APPLICATION OF WOUND VAC Right 12/15/2018   Procedure: APPLICATION OF WOUND VAC;  Surgeon: [name], MD;  Location: [place];  Service: Orthopedics;  Laterality: Right;   AV FISTULA PLACEMENT Left 03/25/2019   Procedure: LEFT ARM ARTERIOVENOUS (AV) FISTULA CREATION;  Surgeon: [name], MD;  Location: [place] [facility];  Service: Vascular;  Laterality: Left;   [place] Left 05/20/2019   Procedure: SECOND STAGE LEFT BASCILIC VEIN TRANSPOSITION;  Surgeon: [name], MD;  Location: [place] [facility];  Service: Vascular;  Laterality: Left;   CARDIAC CATHETERIZATION  02/17/2014   CHOLECYSTECTOMY     COLONOSCOPY  2011   in [place], [place]  2008   CYSTOSCOPY N/A 06/29/2020   Procedure: [place];  Surgeon: [name], MD;  Location: [place];  Service: Urology;  Laterality: N/A;   CYSTOSCOPY WITH FULGERATION Bilateral 06/17/2020   Procedure: CYSTOSCOPY,BILATERAL RETROGRADE, CLOT EVACUATION WITH FULGERATION OF THE BLADDER;  Surgeon: Janith Lima, MD;  Location: [place];  Service: Urology;  Laterality: Bilateral;   CYSTOSCOPY WITH FULGERATION N/A 06/28/2020   Procedure: CYSTOSCOPY WITH CLOT EVACUATION AND FULGERATION OF BLEEDERS;  Surgeon: [name], MD;  Location: [place];  Service: Urology;  Laterality: N/A;  1 HR   I & D EXTREMITY  Right 12/15/2018   Procedure: DEBRIDEMENT RIGHT FOOT;  Surgeon: [name], MD;  Location: [place];  Service: Orthopedics;  Laterality: Right;   I & D EXTREMITY Right 08/27/2019   Procedure: PARTIAL CUBOID EXCISION RIGHT FOOT;  Surgeon: [name], MD;  Location: [place];  Service: Orthopedics;  Laterality: Right;   I & D EXTREMITY Right 01/07/2020   Procedure: RIGHT FOOT EXCISION INFECTED BONE;  Surgeon: [name], MD;  Location:  [facility] [facility];  Service: Orthopedics;  Laterality: Right;   NECK SURGERY     novemver 2019   STUMP REVISION Left 06/27/2021   Procedure: REVISION LEFT BELOW KNEE AMPUTATION;  Surgeon: [name], MD;  Location: [place];  Service: Orthopedics;  Laterality: Left;   TRANSURETHRAL RESECTION OF PROSTATE N/A 06/28/2020   Procedure: TRANSURETHRAL RESECTION OF THE PROSTATE (TURP);  Surgeon: [name], MD;  Location: [place];  Service: Urology;  Laterality: N/A;   WISDOM TOOTH EXTRACTION     Past Medical History:  Diagnosis Date   Ambulates with cane    straight cane   Anemia    Cervical myelopathy ([place]) 02/06/2018   Chronic kidney disease    dailysis [facility]- home   Complication of anesthesia    Coronary artery disease    Diabetes mellitus without complication ([place])    type2   Diabetic foot ulcer ([place])    Diabetic neuropathy ([place]) 02/06/2018   Gait abnormality 08/24/2018   GERD (gastroesophageal reflux disease)     01/06/20- not current   Gout    History of blood transfusion    History of blood transfusion    History of kidney stones    passed stones   Hypercholesteremia    Hypertension    Hypothyroidism    Neuromuscular disorder (HCC)    neuropathy left leg and bilateral feet   Neuropathy    PONV (postoperative nausea and vomiting)    Prostate cancer ([place])    PVD (peripheral vascular disease) ([place])    with amputations   There were no vitals taken for this visit.  Opioid Risk Score:   Fall Risk Score:  `1  Depression screen [facility] 2/9      11/02/2021    3:29 PM 09/25/2021   11:00 AM 02/28/2020    3:07 PM 01/10/2020    4:21 PM 06/20/2016    8:18 AM  Depression screen [facility] 2/9  Decreased Interest 0 1 0 0 0  Down, Depressed, Hopeless 0 3 0 0 0  PHQ - 2 Score 0 4 0 0 0  Altered sleeping  3     Tired, decreased energy  3     Change in appetite  3     Feeling bad or failure about yourself   0     Trouble concentrating  3     Moving slowly or fidgety/restless  3     Suicidal thoughts  0     PHQ-9 Score  19       Review of Systems  Constitutional:  Positive for appetite change.       Loss of taste  Musculoskeletal:  Positive for gait problem.  Neurological:  Positive for tremors and numbness.       Spasms  Psychiatric/Behavioral:         Depression  All other systems reviewed and are negative.      Objective:   Physical Exam Gen: no distress, normal appearing HEENT: oral mucosa pink and moist, NCAT Cardio: Reg rate Chest: normal effort, normal rate of breathing Abd: soft, non-distended Ext: no edema Psych: pleasant, normal affect Skin: intact Neuro: Alert and oriented x3 Musculoskeletal: right foot twitches, missing digits on right foot.      Assessment & Plan:  1) L BKA -continue PT  2) Cognitive deficits -discussed that recommended brand of fish oil was Rosita -continue modafinil as he has experienced great improvements with this --discussed MRI results that show mild atrophy and small vessel  changes  3) Fatigue/daytime somnolence -continue modafinil '200mg'$  daily -continue fish oil supplement -continue coffee  4) Afib -continue fish oil  5) Neuropathy -Discussed Qutenza as an option for neuropathic pain control. Discussed that this is a capsaicin patch, stronger than capsaicin cream. Discussed that it is currently approved for diabetic peripheral neuropathy and post-herpetic neuralgia, but that it has also shown benefit in treating other forms of neuropathy. Provided patient with link to site to  learn more about the patch: [employer]. Discussed that the patch would be placed in office and benefits usually last 3 months. Discussed that unintended exposure to capsaicin can cause severe irritation of eyes, mucous membranes, respiratory tract, and skin, but that Qutenza is a local treatment and does not have the systemic side effects of other nerve medications. Discussed that there may be pain, itching, erythema, and decreased sensory function associated with the application of Qutenza. Side effects usually subside within 1 week. A cold pack of analgesic medications can help with these side effects. Blood pressure can also be increased due to pain associated with administration of the patch.   1 patch of Qutenza was applied to the area of pain. Ice packs were applied during the procedure to ensure patient comfort. Blood pressure was monitored every 15 minutes. The patient tolerated the procedure well. Post-procedure instructions were given and follow-up has been scheduled.

## 2021-11-19 NOTE — Addendum Note (Signed)
Addended by: [name] on: 11/19/2021 01:56 PM   Modules accepted: Orders

## 2021-11-20 ENCOUNTER — Encounter: Payer: Self-pay | Admitting: Physical Therapy

## 2021-11-20 ENCOUNTER — Ambulatory Visit (INDEPENDENT_AMBULATORY_CARE_PROVIDER_SITE_OTHER): Payer: Medicare Other | Admitting: Physical Therapy

## 2021-11-20 DIAGNOSIS — R293 Abnormal posture: Secondary | ICD-10-CM | POA: Diagnosis not present

## 2021-11-20 DIAGNOSIS — R2681 Unsteadiness on feet: Secondary | ICD-10-CM | POA: Diagnosis not present

## 2021-11-20 DIAGNOSIS — R2689 Other abnormalities of gait and mobility: Secondary | ICD-10-CM

## 2021-11-20 DIAGNOSIS — M6281 Muscle weakness (generalized): Secondary | ICD-10-CM

## 2021-11-20 DIAGNOSIS — M25662 Stiffness of left knee, not elsewhere classified: Secondary | ICD-10-CM

## 2021-11-20 DIAGNOSIS — M79605 Pain in left leg: Secondary | ICD-10-CM

## 2021-11-20 DIAGNOSIS — L98498 Non-pressure chronic ulcer of skin of other sites with other specified severity: Secondary | ICD-10-CM

## 2021-11-20 LAB — TESTOSTERONE: Testosterone: 264 ng/dL (ref 264–916)

## 2021-11-20 LAB — VITAMIN D 25 HYDROXY (VIT D DEFICIENCY, FRACTURES): Vit D, 25-Hydroxy: 42 ng/mL (ref 30.0–100.0)

## 2021-11-20 NOTE — Therapy (Signed)
OUTPATIENT PHYSICAL THERAPY TREATMENT NOTE   Patient Name: [name] MRN: [id number] DOB:30-May-1950, 71 y.o., male Today's Date: 11/20/2021  PCP: Lorenda Hatchet, FNP REFERRING PROVIDER: [name], MD  END OF SESSION:   PT End of Session - 11/20/21 1243     Visit Number 2    Number of Visits 25    Date for PT Re-Evaluation 02/07/22    Authorization Type Medicare and BCBS    Progress Note Due on Visit 10    PT Start Time 1146    PT Stop Time 9323    PT Time Calculation (min) 55 min    Equipment Utilized During Treatment Gait belt    Activity Tolerance Patient limited by fatigue;Patient limited by pain    Behavior During Therapy WFL for tasks assessed/performed             Past Medical History:  Diagnosis Date   Ambulates with cane    straight cane   Anemia    Cervical myelopathy ([place]) 02/06/2018   Chronic kidney disease    dailysis M W F- home   Complication of anesthesia    Coronary artery disease    Diabetes mellitus without complication ([place])    type2   Diabetic foot ulcer ([place])    Diabetic neuropathy ([place]) 02/06/2018   Gait abnormality 08/24/2018   GERD (gastroesophageal reflux disease)     01/06/20- not current   Gout    History of blood transfusion    History of blood transfusion    History of kidney stones    passed stones   Hypercholesteremia    Hypertension    Hypothyroidism    Neuromuscular disorder ([place])    neuropathy left leg and bilateral feet   Neuropathy    PONV (postoperative nausea and vomiting)    Prostate cancer ([place])    PVD (peripheral vascular disease) ([place])    with amputations   Past Surgical History:  Procedure Laterality Date   A-FLUTTER ABLATION N/A 04/06/2020   Procedure: A-FLUTTER ABLATION;  Surgeon: [name], MD;  Location: [facility];  Service: Cardiovascular;  Laterality: N/A;   AMPUTATION Left 12/25/2013   Procedure: AMPUTATION RAY LEFT 5TH RAY;  Surgeon: [name], MD;  Location: [facility];   Service: Orthopedics;  Laterality: Left;   AMPUTATION Right 12/15/2018   Procedure: AMPUTATION OF 4TH AND 5TH TOES RIGHT FOOT;  Surgeon: [name], MD;  Location: [facility];  Service: Orthopedics;  Laterality: Right;   AMPUTATION Left 02/02/2021   Procedure: LEFT FOOT 4TH RAY AMPUTATION;  Surgeon: [name], MD;  Location: [facility];  Service: Orthopedics;  Laterality: Left;   AMPUTATION Left 05/09/2021   Procedure: LEFT BELOW KNEE AMPUTATION;  Surgeon: [name], MD;  Location: [facility];  Service: Orthopedics;  Laterality: Left;   APPLICATION OF WOUND VAC Right 12/15/2018   Procedure: APPLICATION OF WOUND VAC;  Surgeon: [name], MD;  Location: [facility];  Service: Orthopedics;  Laterality: Right;   AV FISTULA PLACEMENT Left 03/25/2019   Procedure: LEFT ARM ARTERIOVENOUS (AV) FISTULA CREATION;  Surgeon: [name], MD;  Location: [facility];  Service: Vascular;  Laterality: Left;   [place] Left 05/20/2019   Procedure: SECOND STAGE LEFT BASCILIC VEIN TRANSPOSITION;  Surgeon: [name], MD;  Location: [facility];  Service: Vascular;  Laterality: Left;   CARDIAC CATHETERIZATION  02/17/2014   CHOLECYSTECTOMY  COLONOSCOPY  2011   in [place], Normal   CORONARY ARTERY BYPASS GRAFT  2008   CYSTOSCOPY N/A 06/29/2020   Procedure: CYSTOSCOPY WITH CLOT EVACUATION AND  FULGERATION;  Surgeon: [name], MD;  Location: [facility];  Service: Urology;  Laterality: N/A;   CYSTOSCOPY WITH FULGERATION Bilateral 06/17/2020   Procedure: CYSTOSCOPY,BILATERAL RETROGRADE, CLOT EVACUATION WITH FULGERATION OF THE BLADDER;  Surgeon: Janith Lima, MD;  Location: [facility];  Service: Urology;  Laterality: Bilateral;   CYSTOSCOPY WITH FULGERATION N/A 06/28/2020   Procedure: CYSTOSCOPY WITH CLOT EVACUATION AND FULGERATION OF BLEEDERS;  Surgeon: [name], MD;  Location: [facility];  Service: Urology;  Laterality: N/A;  1 HR   I & D EXTREMITY Right 12/15/2018   Procedure:  DEBRIDEMENT RIGHT FOOT;  Surgeon: [name], MD;  Location: [facility];  Service: Orthopedics;  Laterality: Right;   I & D EXTREMITY Right 08/27/2019   Procedure: PARTIAL CUBOID EXCISION RIGHT FOOT;  Surgeon: [name], MD;  Location: [facility];  Service: Orthopedics;  Laterality: Right;   I & D EXTREMITY Right 01/07/2020   Procedure: RIGHT FOOT EXCISION INFECTED BONE;  Surgeon: [name], MD;  Location: [facility];  Service: Orthopedics;  Laterality: Right;   NECK SURGERY     novemver 2019   STUMP REVISION Left 06/27/2021   Procedure: REVISION LEFT BELOW KNEE AMPUTATION;  Surgeon: [name], MD;  Location: [facility];  Service: Orthopedics;  Laterality: Left;   TRANSURETHRAL RESECTION OF PROSTATE N/A 06/28/2020   Procedure: TRANSURETHRAL RESECTION OF THE PROSTATE (TURP);  Surgeon: [name], MD;  Location: [facility];  Service: Urology;  Laterality: N/A;   WISDOM TOOTH EXTRACTION     Patient Active Problem List   Diagnosis Date Noted   Community acquired pneumonia of left lower lobe of lung 08/18/2021   Paroxysmal atrial fibrillation ([place]) 08/18/2021   Anemia of chronic renal failure 08/18/2021   Thrombocytopenia ([place]) 44/06/4740   Acute metabolic encephalopathy 59/56/3875   Action induced myoclonus 08/18/2021   Dehiscence of amputation stump of left lower extremity ([place])    Left below-knee amputee ([name]) 05/11/2021   S/P BKA (below knee amputation) unilateral, left ([place]) 05/09/2021   Partial nontraumatic amputation of foot, left ([place]) 02/20/2021   Abscess of bursa of left foot 02/03/2021   Cutaneous abscess of left foot    Acute osteomyelitis of metatarsal bone of left foot (HCC)    Symptomatic anemia 09/13/2020   COVID-19 virus infection 09/13/2020   Pressure injury of skin 06/29/2020   Gross hematuria 06/16/2020   Typical atrial flutter ([place]) 03/24/2020   Bilateral pleural effusion 02/24/2020   Healthcare maintenance 01/28/2020   Shortness of breath 01/28/2020   History of  partial ray amputation of fourth toe of right foot ([place]) 09/08/2019   Ulcerated, foot, right, with necrosis of bone (HCC)    Chronic cough 06/25/2019   Cutaneous abscess of right foot    Subacute osteomyelitis of right foot ([place]) 12/14/2018   AKI (acute kidney injury) ([place]) 12/14/2018   ESRD on hemodialysis ([place]) 12/14/2018   Gait abnormality 08/24/2018   Diabetic neuropathy ([place]) 02/06/2018   Cervical myelopathy ([place]) 02/06/2018   Onychomycosis 10/30/2017   Other spondylosis with radiculopathy, cervical region 01/27/2017   Midfoot ulcer, right, limited to breakdown of skin ([place]) 11/15/2016   Lateral epicondylitis, left elbow 08/12/2016   Cellulitis of fifth toe of right foot 07/29/2016   Prostate cancer ([place]) 06/19/2016   Non-pressure chronic ulcer of other part  of right foot limited to breakdown of skin ([place]) 04/04/2016   Cellulitis of leg, right 12/24/2015   Cellulitis of right lower extremity 12/24/2015   Gout 09/14/2012   Hypertension associated with diabetes ([place]) 09/14/2012   Hypothyroidism 09/14/2012   CAD (coronary artery disease) 09/14/2012   Insulin dependent type 2 diabetes mellitus ([place]) 09/14/2012   Hyperlipidemia associated with type 2 diabetes mellitus ([place])    Neuropathy ([place])     REFERRING DIAG: P95.093O (ICD-10-CM) - Below-knee amputation of left lower extremity  THERAPY DIAG:  Unsteadiness on feet  Other abnormalities of gait and mobility  Abnormal posture  Muscle weakness (generalized)  Pain in left leg  Non-pressure chronic ulcer of skin of other sites with other specified severity (HCC)  Stiffness of left knee, not elsewhere classified  Rationale for Evaluation and Treatment Rehabilitation  PERTINENT HISTORY: braced right foot with cavovarus collapse, CAP without hypoxia, acute metabolic encephalopathy, ESRD on HD Sun/W/F, afib s/p ablation, DM II, neuropathy, HTN, HLD, hypothyroidism, anemia, prostate cancer s/p TUPP. cervical myelopathy, gout,  CAD w/CABG 2005  PRECAUTIONS: no BP in LUE, fall risk   SUBJECTIVE: He wears the prosthesis 2 hrs 2x/day with no concerns, and has pain in his residual limb on tibia and burning pressure on anterolateral limb when standing. The wound along the residual limb is healing with dry edges. He is using his bathroom with grab bars and the prosthesis, and he hasn't been using his restroom prior to the installation of the grab bars. Pt expressed goals of walking with RW and navigating one step to enter home, with desire to stop using w/c.  PAIN:  Are you having pain? No   OBJECTIVE: (objective measures completed at initial evaluation unless otherwise dated) COGNITION: Overall cognitive status: Within functional limits for tasks assessed              SENSATION: Not tested   POSTURE: rounded shoulders, forward head, and anterior pelvic tilt - RLE resting position with tibial IR, severe foot supination and inversion with cavovarus collapse   LOWER EXTREMITY ROM:   ROM (A: AROM, P: PROM) Right eval Left eval  Hip flexion      Hip extension      Hip abduction      Hip adduction      Hip internal rotation      Hip external rotation      Knee flexion      Knee extension   Standing with RW and prosthesis  A: -16*  Ankle dorsiflexion      Ankle plantarflexion      Ankle inversion      Ankle eversion       (Blank rows = not tested)   LOWER EXTREMITY MMT:   MMT Right eval Left eval  Hip flexion      Hip extension      Hip abduction      Hip adduction      Hip internal rotation      Hip external rotation      Knee flexion      Knee extension      Ankle dorsiflexion      Ankle plantarflexion      Ankle inversion      Ankle eversion      (Blank rows = not tested)   Functional strength with standing and transfers 3- at hip and knee bil., with no motion at Rt ankle   TRANSFERS: Sit to stand: minA - fell backwards into  chair upon first attempt, MinA for next attempt with RW  stabilization and verbal cueing for foot placement Stand to sit: minA for controlling descent and stabilization   GAIT: Gait pattern:  adduction RLE with toe-in, step to pattern, decreased step length- Right, decreased stance time- Right, decreased stride length, decreased hip/knee flexion- Left, decreased ankle dorsiflexion- Right, Left hip hike, Right foot flat, knee flexed in stance- Right, knee flexed in stance- Left, lateral hip instability, lateral lean- Right, trunk flexed, and poor foot clearance- Right Distance walked: 35' Assistive device utilized: Environmental consultant - 2 wheeled & Left TTA prosthesis, right double upright AFO custom shoe Level of assistance: MinA to recover losses of balance with straight surface walking, ModA to recover losses of balance turning Lt, MaxA with loss of balance during turning Rt Comments: Rt lower leg positioning reduced foot clearance with catching Lt heel most steps, and promoted Rt lateral leaning/lateral loss of balance. Requires excessive BUE support on RW.   FUNCTIONAL TESTs:  Berg Balance Scale: 6/56, 13/56 with RW   [facility] PT Assessment - 11/13/21 0001                Standardized Balance Assessment    Standardized Balance Assessment Berg Balance Test          Berg Balance Test    Sit to Stand Needs moderate or maximal assist to stand   Berg task with RW = 1    Standing Unsupported Unable to stand 30 seconds unassisted   Berg task with RW = 2    Sitting with Back Unsupported but Feet Supported on Floor or Stool Able to sit safely and securely 2 minutes     Stand to Sit Needs assistance to sit   Berg task with RW = 2    Transfers Able to transfer with verbal cueing and /or supervision     Standing Unsupported with Eyes Closed Needs help to keep from falling   Berg task with RW = 0    Standing Unsupported with Feet Together Needs help to attain position and unable to hold for 15 seconds   Berg task with RW = 0    From Standing, Reach Forward with  Outstretched Arm Loses balance while trying/requires external support   Berg task with RW = 1 (CGA reaching with Lt)    From Standing Position, Pick up Object from Floor Unable to try/needs assist to keep balance   Berg task with RW = 0    From Standing Position, Turn to Look Behind Over each Shoulder Needs assist to keep from losing balance and falling   Berg task with RW = 0    Turn 360 Degrees Needs assistance while turning   Berg task with RW = 0    Standing Unsupported, Alternately Place Feet on Step/Stool Needs assistance to keep from falling or unable to try   Berg task with RW = 0    Standing Unsupported, One Foot in [employer] balance while stepping or standing   Berg task with RW = 0    Standing on One Leg Unable to try or needs assist to prevent fall   Berg task with RW = 1    Total Score 6     Berg comment: Berg tasks with RW = 13/56                     CURRENT PROSTHETIC WEAR ASSESSMENT: Patient is dependent with: skin check, residual limb care, care of  non-amputated limb, prosthetic cleaning, ply sock cleaning, correct ply sock adjustment, proper wear schedule/adjustment, and proper weight-bearing schedule/adjustment Donning prosthesis: Min A and maximal cueing Doffing prosthesis: SBA Prosthetic wear tolerance: 30 minutes 1-2x/day 6 of 6 days since delivery Prosthetic weight bearing tolerance: stood ~5 minutes with c/o tibial crest pain moderate level after weight bearing Edema: present with slow capillary refill Residual limb condition: 1.6 cm scab along incision, cylinderical shape, normal color & temperature, mildly dry skin, slight scar adhesion at distal tibia,  Prosthetic description: silicone liner with pin lock suspension, 2nd pelite foam liner, total contact socket, SACH foot     TODAY'S TREATMENT:  11/20/21 Prosthetic Training with Transtibial Prosthesis: PT education about rationale for progressing wear tolerance and padding process in residual limb with  suggestion to discuss with prosthetist prior to pt adding padding for limb pain, pt and wife verbalized understanding PT explanation and demo of donning liner for pin orientation to prevent skin shear on residual limb with misalignment, pt returned demo x 1 and wife verbalized understanding PT education on ply sock donning, cleaning, and adjustment with tactile feedback in socket during 3 x 6' ambulation with RW, pt and wife verbalized understanding and received printed pt instructions Sit to/from stand x 3 from w/c with verbal cueing for hand placement during transfers with RW Suggest keeping wear time 2 hrs 2x/day until burning pain subsides, use of minimal unscented oil over lateral shin to reduce friction in liner. Pt and wife verbalized understanding   11/13/21 PATIENT EDUCATED ON FOLLOWING PROSTHETIC CARE: Prosthetic wear tolerance: 2 hours 2x/day, 7 days/week Other education  Residual limb care, Prosthetic cleaning, Ply sock cleaning, Correct ply sock adjustment, [place] donning, Proper doffing, Proper wear schedule/adjustment, and Comments positioning in sitting Person educated: Patient and Research officer, trade union Education method: Explanation, Demonstration, Tactile cues, and Verbal cues Education comprehension: verbalized understanding, returned demonstration, verbal cues required, tactile cues required, and needs further education     HOME EXERCISE PROGRAM:     ASSESSMENT:   CLINICAL IMPRESSION: Today's session focused on prosthetic care including liner donning, wound assessment, ply sock use, and progression of wear time. It was suggested to keep current liner and prosthesis wear time suggestions maintained at 2 hrs 2x/day as pt reports burning pain and pressure over anterolateral shin, with the application of unscented oil to reduce friction in liner. Pt and wife verbalized understanding and received handout covering ply sock fitting. He continues to benefit from skilled PT to address  prosthesis management and functional limitations.   OBJECTIVE IMPAIRMENTS Abnormal gait, decreased activity tolerance, decreased balance, decreased coordination, decreased endurance, decreased knowledge of use of DME, decreased mobility, difficulty walking, decreased ROM, decreased strength, decreased safety awareness, increased edema, impaired flexibility, postural dysfunction, prosthetic dependency , and pain.    ACTIVITY LIMITATIONS bending, sitting, standing, squatting, stairs, transfers, bathing, dressing, hygiene/grooming, locomotion level, and prosthetic use   PARTICIPATION LIMITATIONS: cleaning, laundry, and community activity   PERSONAL FACTORS Fitness, Time since onset of injury/illness/exacerbation, and 3+ comorbidities: see PMH  are also affecting patient's functional outcome.    REHAB POTENTIAL: Good   CLINICAL DECISION MAKING: Evolving/moderate complexity   EVALUATION COMPLEXITY: Moderate     GOALS: Goals reviewed with patient? Yes   SHORT TERM GOALS: Target date: 12/14/21   Patient independent with donning/doffing prosthesis. Baseline: see objective data Goal status: INITIAL   2.  Patient verbalize proper assessment of wound with prosthetic use.  Baseline: see objective data Goal status: INITIAL   3.  Patient  wears prosthesis >/= 8 hours/day daily without increase in wound & limb pain </= 5/10 to facilitate functional mobility and safety with transfers. Baseline: see objective data Goal status: INITIAL   4.  Patient able to transfer sit to/from stand chair with armrests with RW with supervision. Baseline: see objective data Goal status: INITIAL   5.  Patient ambulates 10' with RW & prosthesis / AFO with minA including negotiating around obstacles.  Baseline: see objective data Goal status: INITIAL     LONG TERM GOALS: Target date: 02/07/22   Patient wears prosthesis > 90% of waking hours with no limb pain or skin issues to promote function and  safety. Baseline: see objective data Goal status: INITIAL   2.  Patient independent with all aspects of prosthetic care to enable safe utilization of prosthesis. Baseline: see objective data Goal status: INITIAL   3.  Patient ambulates 16' with LRAD safely and modified independent to allow for household mobility. Baseline: see objective data Goal status: INITIAL   4.  Patient able to ambulate 200' and navigate ramp and curb with LRAD safely with minA/ caregiver assist to allow for community mobility. Baseline: see objective data Goal status: INITIAL   5.  Berg balance score >/= 30/56 with RW to indicate low fall risk during household level balance requirements with UE support. Baseline: see objective data Goal status: INITIAL     PLAN: PT FREQUENCY: 2x/week   PT DURATION: other: 90 days   PLANNED INTERVENTIONS: Therapeutic exercises, Therapeutic activity, Neuromuscular re-education, Balance training, Gait training, Patient/Family education, Self Care, Stair training, Vestibular training, Orthotic/Fit training, Prosthetic training, DME instructions, Moist heat, scar mobilization, Manual therapy, Re-evaluation, and physical performance tests   PLAN FOR NEXT SESSION: ask about prosthetic wear tolerance and WB time, Lt knee stretches for extension range and establish sink HEP with standing balance   Jana Hakim, Student-PT 11/20/2021, 12:54 PM  This entire session of physical therapy was performed under the direct supervision of PT signing evaluation /treatment. PT reviewed note and agrees.  Jamey Reas, PT, DPT 11/20/2021, 4:57 PM

## 2021-11-20 NOTE — Patient Instructions (Addendum)
Hanger Socks: 1-ply is yellow color at top, 3-ply is green at top, 5-ply is navy blue at top How many ply you need depends on your limb size.  You should have even pressure on your limb when standing & walking.  Guidance points: 1. How ease it goes on? Should be some resistance. Too few it goes on too easily. Too many it takes a lot of work to get it on. 2. How many clicks you get. Especially clicks in sitting. 3. After standing or walking, check knee cap. Bottom should be just under the front lip.  Too few bottom of knee cap sits on indention. Too many bottom is above front lip. 4. Have your feet beside each other & hips over feet. Place hands on your waist. Pelvis Should be level. Too few prosthetic side will be low. Too many prosthetic side will be high.    Get ply socks correct before you leave the house. Take extra socks with you. Take one 3-ply and two 1-ply with you. This is in addition to what you are wearing.  You may need to adjust ply socks following dialysis.

## 2021-11-22 ENCOUNTER — Ambulatory Visit (INDEPENDENT_AMBULATORY_CARE_PROVIDER_SITE_OTHER): Payer: Medicare Other | Admitting: Physical Therapy

## 2021-11-22 ENCOUNTER — Encounter: Payer: Self-pay | Admitting: Physical Therapy

## 2021-11-22 DIAGNOSIS — M6281 Muscle weakness (generalized): Secondary | ICD-10-CM

## 2021-11-22 DIAGNOSIS — L98498 Non-pressure chronic ulcer of skin of other sites with other specified severity: Secondary | ICD-10-CM

## 2021-11-22 DIAGNOSIS — R2689 Other abnormalities of gait and mobility: Secondary | ICD-10-CM | POA: Diagnosis not present

## 2021-11-22 DIAGNOSIS — M79605 Pain in left leg: Secondary | ICD-10-CM

## 2021-11-22 DIAGNOSIS — R2681 Unsteadiness on feet: Secondary | ICD-10-CM

## 2021-11-22 DIAGNOSIS — R293 Abnormal posture: Secondary | ICD-10-CM

## 2021-11-22 DIAGNOSIS — M25662 Stiffness of left knee, not elsewhere classified: Secondary | ICD-10-CM

## 2021-11-22 NOTE — Therapy (Signed)
OUTPATIENT PHYSICAL THERAPY TREATMENT NOTE   Patient Name: [name] MRN: [id number] DOB:[birth year], 71 y.o., male Today's Date: 11/22/2021  PCP: Lorenda Hatchet, FNP REFERRING PROVIDER: [name], MD  END OF SESSION:   PT End of Session - 11/22/21 1149     Visit Number 3    Number of Visits 25    Date for PT Re-Evaluation 02/07/22    Authorization Type Medicare and BCBS    Progress Note Due on Visit 10    PT Start Time 1146    PT Stop Time 1230    PT Time Calculation (min) 44 min    Equipment Utilized During Treatment Gait belt    Activity Tolerance Patient limited by fatigue;Patient limited by pain    Behavior During Therapy [facility] for tasks assessed/performed              Past Medical History:  Diagnosis Date   Ambulates with cane    straight cane   Anemia    Cervical myelopathy ([place]) 02/06/2018   Chronic kidney disease    dailysis M W F- home   Complication of anesthesia    Coronary artery disease    Diabetes mellitus without complication ([place])    type2   Diabetic foot ulcer ([place])    Diabetic neuropathy ([place]) 02/06/2018   Gait abnormality 08/24/2018   GERD (gastroesophageal reflux disease)     01/06/20- not current   Gout    History of blood transfusion    History of blood transfusion    History of kidney stones    passed stones   Hypercholesteremia    Hypertension    Hypothyroidism    Neuromuscular disorder ([place])    neuropathy left leg and bilateral feet   Neuropathy    PONV (postoperative nausea and vomiting)    Prostate cancer ([place])    PVD (peripheral vascular disease) ([place])    with amputations   Past Surgical History:  Procedure Laterality Date   A-FLUTTER ABLATION N/A 04/06/2020   Procedure: A-FLUTTER ABLATION;  Surgeon: [name], MD;  Location: [place] [facility];  Service: Cardiovascular;  Laterality: N/A;   AMPUTATION Left 12/25/2013   Procedure: AMPUTATION RAY LEFT 5TH RAY;  Surgeon: [name], MD;  Location: [facility];   Service: Orthopedics;  Laterality: Left;   AMPUTATION Right 12/15/2018   Procedure: AMPUTATION OF 4TH AND 5TH TOES RIGHT FOOT;  Surgeon: [name], MD;  Location: [place];  Service: Orthopedics;  Laterality: Right;   AMPUTATION Left 02/02/2021   Procedure: LEFT FOOT 4TH RAY AMPUTATION;  Surgeon: [name], MD;  Location: [place];  Service: Orthopedics;  Laterality: Left;   AMPUTATION Left 05/09/2021   Procedure: LEFT BELOW KNEE AMPUTATION;  Surgeon: [name], MD;  Location: [place];  Service: Orthopedics;  Laterality: Left;   APPLICATION OF WOUND VAC Right 12/15/2018   Procedure: APPLICATION OF WOUND VAC;  Surgeon: [name], MD;  Location: [place];  Service: Orthopedics;  Laterality: Right;   AV FISTULA PLACEMENT Left 03/25/2019   Procedure: LEFT ARM ARTERIOVENOUS (AV) FISTULA CREATION;  Surgeon: [name], MD;  Location: [place];  Service: Vascular;  Laterality: Left;   [place] Left 05/20/2019   Procedure: SECOND STAGE LEFT BASCILIC VEIN TRANSPOSITION;  Surgeon: [name], MD;  Location: [place];  Service: Vascular;  Laterality: Left;   CARDIAC CATHETERIZATION  02/17/2014   CHOLECYSTECTOMY  COLONOSCOPY  2011   in [place], Normal   CORONARY ARTERY BYPASS GRAFT  2008   CYSTOSCOPY N/A 06/29/2020   Procedure: CYSTOSCOPY WITH CLOT EVACUATION AND  FULGERATION;  Surgeon: [name], MD;  Location: [place];  Service: Urology;  Laterality: N/A;   CYSTOSCOPY WITH FULGERATION Bilateral 06/17/2020   Procedure: CYSTOSCOPY,BILATERAL RETROGRADE, CLOT EVACUATION WITH FULGERATION OF THE BLADDER;  Surgeon: Janith Lima, MD;  Location: [place];  Service: Urology;  Laterality: Bilateral;   CYSTOSCOPY WITH FULGERATION N/A 06/28/2020   Procedure: CYSTOSCOPY WITH CLOT EVACUATION AND FULGERATION OF BLEEDERS;  Surgeon: [name], MD;  Location: [place];  Service: Urology;  Laterality: N/A;  1 HR   I & D EXTREMITY Right 12/15/2018   Procedure:  DEBRIDEMENT RIGHT FOOT;  Surgeon: [name], MD;  Location: [place];  Service: Orthopedics;  Laterality: Right;   I & D EXTREMITY Right 08/27/2019   Procedure: PARTIAL CUBOID EXCISION RIGHT FOOT;  Surgeon: [name], MD;  Location: [place];  Service: Orthopedics;  Laterality: Right;   I & D EXTREMITY Right 01/07/2020   Procedure: RIGHT FOOT EXCISION INFECTED BONE;  Surgeon: [name], MD;  Location: [place];  Service: Orthopedics;  Laterality: Right;   NECK SURGERY     novemver 2019   STUMP REVISION Left 06/27/2021   Procedure: REVISION LEFT BELOW KNEE AMPUTATION;  Surgeon: [name], MD;  Location: [place];  Service: Orthopedics;  Laterality: Left;   TRANSURETHRAL RESECTION OF PROSTATE N/A 06/28/2020   Procedure: TRANSURETHRAL RESECTION OF THE PROSTATE (TURP);  Surgeon: [name], MD;  Location: [place];  Service: Urology;  Laterality: N/A;   WISDOM TOOTH EXTRACTION     Patient Active Problem List   Diagnosis Date Noted   Community acquired pneumonia of left lower lobe of lung 08/18/2021   Paroxysmal atrial fibrillation ([place]) 08/18/2021   Anemia of chronic renal failure 08/18/2021   Thrombocytopenia ([place]) 44/06/4740   Acute metabolic encephalopathy 59/56/3875   Action induced myoclonus 08/18/2021   Dehiscence of amputation stump of left lower extremity ([place])    Left below-knee amputee ([name]) 05/11/2021   S/P BKA (below knee amputation) unilateral, left ([place]) 05/09/2021   Partial nontraumatic amputation of foot, left ([place]) 02/20/2021   Abscess of bursa of left foot 02/03/2021   Cutaneous abscess of left foot    Acute osteomyelitis of metatarsal bone of left foot (HCC)    Symptomatic anemia 09/13/2020   COVID-19 virus infection 09/13/2020   Pressure injury of skin 06/29/2020   Gross hematuria 06/16/2020   Typical atrial flutter ([place]) 03/24/2020   Bilateral pleural effusion 02/24/2020   Healthcare maintenance 01/28/2020   Shortness of breath 01/28/2020   History of  partial ray amputation of fourth toe of right foot ([place]) 09/08/2019   Ulcerated, foot, right, with necrosis of bone (HCC)    Chronic cough 06/25/2019   Cutaneous abscess of right foot    Subacute osteomyelitis of right foot ([place]) 12/14/2018   AKI (acute kidney injury) ([place]) 12/14/2018   ESRD on hemodialysis ([place]) 12/14/2018   Gait abnormality 08/24/2018   Diabetic neuropathy ([place]) 02/06/2018   Cervical myelopathy ([place]) 02/06/2018   Onychomycosis 10/30/2017   Other spondylosis with radiculopathy, cervical region 01/27/2017   Midfoot ulcer, right, limited to breakdown of skin ([place]) 11/15/2016   Lateral epicondylitis, left elbow 08/12/2016   Cellulitis of fifth toe of right foot 07/29/2016   Prostate cancer ([place]) 06/19/2016   Non-pressure chronic ulcer of other part  of right foot limited to breakdown of skin ([place]) 04/04/2016   Cellulitis of leg, right 12/24/2015   Cellulitis of right lower extremity 12/24/2015   Gout 09/14/2012   Hypertension associated with diabetes ([place]) 09/14/2012   Hypothyroidism 09/14/2012   CAD (coronary artery disease) 09/14/2012   Insulin dependent type 2 diabetes mellitus ([place]) 09/14/2012   Hyperlipidemia associated with type 2 diabetes mellitus ([place])    Neuropathy ([place])     REFERRING DIAG: V42.595G (ICD-10-CM) - Below-knee amputation of left lower extremity  THERAPY DIAG:  Unsteadiness on feet  Other abnormalities of gait and mobility  Abnormal posture  Muscle weakness (generalized)  Pain in left leg  Non-pressure chronic ulcer of skin of other sites with other specified severity (HCC)  Stiffness of left knee, not elsewhere classified  Rationale for Evaluation and Treatment Rehabilitation  PERTINENT HISTORY: braced right foot with cavovarus collapse, CAP without hypoxia, acute metabolic encephalopathy, ESRD on HD Sun/W/F, afib s/p ablation, DM II, neuropathy, HTN, HLD, hypothyroidism, anemia, prostate cancer s/p TUPP. cervical myelopathy, gout,  CAD w/CABG 2005  PRECAUTIONS: no BP in LUE, fall risk   SUBJECTIVE: He has been adjusting ply socks & using baby oil as PT recommended and it has minimized his residual limb discomfort.   PAIN:  Are you having pain? No   OBJECTIVE: (objective measures completed at initial evaluation unless otherwise dated) COGNITION: Overall cognitive status: Within functional limits for tasks assessed              SENSATION: Not tested   POSTURE: rounded shoulders, forward head, and anterior pelvic tilt - RLE resting position with tibial IR, severe foot supination and inversion with cavovarus collapse   LOWER EXTREMITY ROM:   ROM (A: AROM, P: PROM) Right eval Left eval  Hip flexion      Hip extension      Hip abduction      Hip adduction      Hip internal rotation      Hip external rotation      Knee flexion      Knee extension   Standing with RW and prosthesis  A: -16*  Ankle dorsiflexion      Ankle plantarflexion      Ankle inversion      Ankle eversion       (Blank rows = not tested)   LOWER EXTREMITY MMT:   MMT Right eval Left eval  Hip flexion      Hip extension      Hip abduction      Hip adduction      Hip internal rotation      Hip external rotation      Knee flexion      Knee extension      Ankle dorsiflexion      Ankle plantarflexion      Ankle inversion      Ankle eversion      (Blank rows = not tested)   Functional strength with standing and transfers 3- at hip and knee bil., with no motion at Rt ankle   TRANSFERS: Sit to stand: minA - fell backwards into chair upon first attempt, MinA for next attempt with RW stabilization and verbal cueing for foot placement Stand to sit: minA for controlling descent and stabilization   GAIT: Gait pattern:  adduction RLE with toe-in, step to pattern, decreased step length- Right, decreased stance time- Right, decreased stride length, decreased hip/knee flexion- Left, decreased ankle dorsiflexion- Right, Left hip hike,  Right foot flat, knee flexed in stance- Right, knee flexed in stance- Left, lateral hip instability, lateral lean- Right, trunk flexed, and poor foot clearance- Right Distance walked: 35' Assistive device utilized: Environmental consultant - 2 wheeled & Left TTA prosthesis, right double upright AFO custom shoe Level of assistance: MinA to recover losses of balance with straight surface walking, ModA to recover losses of balance turning Lt, MaxA with loss of balance during turning Rt Comments: Rt lower leg positioning reduced foot clearance with catching Lt heel most steps, and promoted Rt lateral leaning/lateral loss of balance. Requires excessive BUE support on RW.   FUNCTIONAL TESTs:  Berg Balance Scale: 6/56, 13/56 with RW   [facility] PT Assessment - 11/13/21 0001                Standardized Balance Assessment    Standardized Balance Assessment Berg Balance Test          Berg Balance Test    Sit to Stand Needs moderate or maximal assist to stand   Berg task with RW = 1    Standing Unsupported Unable to stand 30 seconds unassisted   Berg task with RW = 2    Sitting with Back Unsupported but Feet Supported on Floor or Stool Able to sit safely and securely 2 minutes     Stand to Sit Needs assistance to sit   Berg task with RW = 2    Transfers Able to transfer with verbal cueing and /or supervision     Standing Unsupported with Eyes Closed Needs help to keep from falling   Berg task with RW = 0    Standing Unsupported with Feet Together Needs help to attain position and unable to hold for 15 seconds   Berg task with RW = 0    From Standing, Reach Forward with Outstretched Arm Loses balance while trying/requires external support   Berg task with RW = 1 (CGA reaching with Lt)    From Standing Position, Pick up Object from Floor Unable to try/needs assist to keep balance   Berg task with RW = 0    From Standing Position, Turn to Look Behind Over each Shoulder Needs assist to keep from losing balance and falling   Berg  task with RW = 0    Turn 360 Degrees Needs assistance while turning   Berg task with RW = 0    Standing Unsupported, Alternately Place Feet on Step/Stool Needs assistance to keep from falling or unable to try   Berg task with RW = 0    Standing Unsupported, One Foot in [employer] balance while stepping or standing   Berg task with RW = 0    Standing on One Leg Unable to try or needs assist to prevent fall   Berg task with RW = 1    Total Score 6     Berg comment: Berg tasks with RW = 13/56                     CURRENT PROSTHETIC WEAR ASSESSMENT: Patient is dependent with: skin check, residual limb care, care of non-amputated limb, prosthetic cleaning, ply sock cleaning, correct ply sock adjustment, proper wear schedule/adjustment, and proper weight-bearing schedule/adjustment Donning prosthesis: Min A and maximal cueing Doffing prosthesis: SBA Prosthetic wear tolerance: 30 minutes 1-2x/day 6 of 6 days since delivery Prosthetic weight bearing tolerance: stood ~5 minutes with c/o tibial crest pain moderate level after weight bearing Edema: present with slow capillary  refill Residual limb condition: 1.6 cm scab along incision, cylinderical shape, normal color & temperature, mildly dry skin, slight scar adhesion at distal tibia,  Prosthetic description: silicone liner with pin lock suspension, 2nd pelite foam liner, total contact socket, SACH foot     TODAY'S TREATMENT:  11/22/2021 Prosthetic Training with right Transtibial Prosthesis & left double upright AFO: Pt arrived with pelite liner & prosthetic socket rotated on limb.  PT demo & verbal cues for proper rotation orienting patella in middle of relief.  Pt & wife verbalized understanding.  PT recommended increasing prosthesis wear to 3 hours 2x/day. Pt & wife verbalized understanding.  Neuromuscular Re-education: HEP designed to work on standing balance, standing endurance, proprioception retraining using limb-socket interface &  upright posture with weight shifts from pelvis.   11/22/21 1244  PT Education  Education Details HEP at sink  Person(s) Educated Patient;Spouse  Methods Explanation;Demonstration;Tactile cues;Verbal cues;Handout  Comprehension Verbalized understanding;Returned demonstration;Verbal cues required;Tactile cues required;Need further instruction    Do each exercise 1-2  times per day Do each exercise 5-10 repetitions Hold each exercise for 2 seconds to feel your location  AT [place].  Try to find this position when standing still for activities.   USE TAPE ON FLOOR TO MARK THE MIDLINE POSITION which is even with middle of sink.  You also should try to feel with your limb pressure in socket.  You are trying to feel with limb what you used to feel with the bottom of your foot.  Side to Side Shift: Moving your hips only (not shoulders): move weight onto your left leg, HOLD/FEEL pressure in socket.  Move back to equal weight on each leg, HOLD/FEEL pressure in socket. Move weight onto your right leg, HOLD/FEEL pressure in socket. Move back to equal weight on each leg, HOLD/FEEL pressure in socket. Repeat.  Start with both hands on sink, then right hand only, then left hand only Front to Back Shift: Moving your hips only (not shoulders): move your weight forward onto your toes, HOLD/FEEL pressure in socket. Move your weight back to equal Flat Foot on both legs, HOLD/FEEL  pressure in socket. Move your weight back onto your heels, HOLD/FEEL  pressure in socket. Move your weight back to equal on both legs, HOLD/FEEL  pressure in socket. Repeat.  Start with both hands on sink, then right hand only, then left hand only  Moving Cones / Cups: With equal weight on each leg: Hold on with one hand the first time, then progress to no hand supports. Move cups from one side of sink to the other. Place cups ~2" out of your reach, progress to 10"  beyond reach.  Place one hand in middle of sink and reach with other hand. Do both arms.  Then hover one hand and move cups with other hand.  Overhead/Upward Reaching: alternated reaching up to top cabinets or ceiling if no cabinets present. Keep equal weight on each leg. Start with one hand support on counter while other hand reaches and progress to no hand support with reaching.  ace one hand in middle of sink and reach with other hand. Do both arms.   Looking Over Shoulders: With equal weight on each leg: alternate turning to look over your shoulders with one hand support on counter as needed.  Start with head motions only to look in front of shoulder, then even with shoulder and progress to looking behind you. To  look to side, move head /eyes, then shoulder on side looking pulls back, shift more weight to side looking and pull hip back. Place one hand in middle of sink and let go with other hand so your shoulder can pull back. Switch hands to look other way. If looking right, use left hand at sink. If looking left, use right hand at sink. 6.  Stepping with leg tapping forefoot into lower cabinet:  Move items under cabinet out of your way. Shift your hips/pelvis so weight on right side. Tighten muscles in hip on right side.  SLOWLY step left leg so front of foot is in cabinet. Then step back to floor.  Repeat with other leg  11/20/21 Prosthetic Training with Transtibial Prosthesis: PT education about rationale for progressing wear tolerance and padding process in residual limb with suggestion to discuss with prosthetist prior to pt adding padding for limb pain, pt and wife verbalized understanding PT explanation and demo of donning liner for pin orientation to prevent skin shear on residual limb with misalignment, pt returned demo x 1 and wife verbalized understanding PT education on ply sock donning, cleaning, and adjustment with tactile feedback in socket during 3 x 6' ambulation with RW, pt and wife  verbalized understanding and received printed pt instructions Sit to/from stand x 3 from w/c with verbal cueing for hand placement during transfers with RW Suggest keeping wear time 2 hrs 2x/day until burning pain subsides, use of minimal unscented oil over lateral shin to reduce friction in liner. Pt and wife verbalized understanding   11/13/21 PATIENT EDUCATED ON FOLLOWING PROSTHETIC CARE: Prosthetic wear tolerance: 2 hours 2x/day, 7 days/week Other education  Residual limb care, Prosthetic cleaning, Ply sock cleaning, Correct ply sock adjustment, [place] donning, Proper doffing, Proper wear schedule/adjustment, and Comments positioning in sitting Person educated: Patient and Research officer, trade union Education method: Explanation, Demonstration, Tactile cues, and Verbal cues Education comprehension: verbalized understanding, returned demonstration, verbal cues required, tactile cues required, and needs further education     HOME EXERCISE PROGRAM:     ASSESSMENT:   CLINICAL IMPRESSION: Patient appears to be tolerating prosthesis wear without wound increasing in size and less limb pain.  He has better understanding of proper donning.  His wife & he seem to understand HEP including need to "feel" ground with residual limb pressure.    OBJECTIVE IMPAIRMENTS Abnormal gait, decreased activity tolerance, decreased balance, decreased coordination, decreased endurance, decreased knowledge of use of DME, decreased mobility, difficulty walking, decreased ROM, decreased strength, decreased safety awareness, increased edema, impaired flexibility, postural dysfunction, prosthetic dependency , and pain.    ACTIVITY LIMITATIONS bending, sitting, standing, squatting, stairs, transfers, bathing, dressing, hygiene/grooming, locomotion level, and prosthetic use   PARTICIPATION LIMITATIONS: cleaning, laundry, and community activity   PERSONAL FACTORS Fitness, Time since onset of injury/illness/exacerbation, and 3+  comorbidities: see PMH  are also affecting patient's functional outcome.    REHAB POTENTIAL: Good   CLINICAL DECISION MAKING: Evolving/moderate complexity   EVALUATION COMPLEXITY: Moderate     GOALS: Goals reviewed with patient? Yes   SHORT TERM GOALS: Target date: 12/14/21   Patient independent with donning/doffing prosthesis. Baseline: see objective data Goal status: INITIAL   2.  Patient verbalize proper assessment of wound with prosthetic use.  Baseline: see objective data Goal status: INITIAL   3.  Patient wears prosthesis >/= 8 hours/day daily without increase in wound & limb pain </= 5/10 to facilitate functional mobility and safety with transfers. Baseline: see objective data Goal status: INITIAL  4.  Patient able to transfer sit to/from stand chair with armrests with RW with supervision. Baseline: see objective data Goal status: INITIAL   5.  Patient ambulates 31' with RW & prosthesis / AFO with minA including negotiating around obstacles.  Baseline: see objective data Goal status: INITIAL     LONG TERM GOALS: Target date: 02/07/22   Patient wears prosthesis > 90% of waking hours with no limb pain or skin issues to promote function and safety. Baseline: see objective data Goal status: INITIAL   2.  Patient independent with all aspects of prosthetic care to enable safe utilization of prosthesis. Baseline: see objective data Goal status: INITIAL   3.  Patient ambulates 80' with LRAD safely and modified independent to allow for household mobility. Baseline: see objective data Goal status: INITIAL   4.  Patient able to ambulate 200' and navigate ramp and curb with LRAD safely with minA/ caregiver assist to allow for community mobility. Baseline: see objective data Goal status: INITIAL   5.  Berg balance score >/= 30/56 with RW to indicate low fall risk during household level balance requirements with UE support. Baseline: see objective data Goal status:  INITIAL     PLAN: PT FREQUENCY: 2x/week   PT DURATION: other: 90 days   PLANNED INTERVENTIONS: Therapeutic exercises, Therapeutic activity, Neuromuscular re-education, Balance training, Gait training, Patient/Family education, Self Care, Stair training, Vestibular training, Orthotic/Fit training, Prosthetic training, DME instructions, Moist heat, scar mobilization, Manual therapy, Re-evaluation, and physical performance tests   PLAN FOR NEXT SESSION: check verbally how HEP is going, check limb & increase wear, instruct in signs of sweating, standing balance activities, prosthetic gait with RW   Jamey Reas, PT, DPT 11/22/2021, 12:46 PM

## 2021-11-22 NOTE — Patient Instructions (Signed)
Do each exercise 1-2  times per day Do each exercise 5-10 repetitions Hold each exercise for 2 seconds to feel your location  AT [place].  Try to find this position when standing still for activities.   USE TAPE ON FLOOR TO MARK THE MIDLINE POSITION which is even with middle of sink.  You also should try to feel with your limb pressure in socket.  You are trying to feel with limb what you used to feel with the bottom of your foot.  Side to Side Shift: Moving your hips only (not shoulders): move weight onto your left leg, HOLD/FEEL pressure in socket.  Move back to equal weight on each leg, HOLD/FEEL pressure in socket. Move weight onto your right leg, HOLD/FEEL pressure in socket. Move back to equal weight on each leg, HOLD/FEEL pressure in socket. Repeat.  Start with both hands on sink, then right hand only, then left hand only Front to Back Shift: Moving your hips only (not shoulders): move your weight forward onto your toes, HOLD/FEEL pressure in socket. Move your weight back to equal Flat Foot on both legs, HOLD/FEEL  pressure in socket. Move your weight back onto your heels, HOLD/FEEL  pressure in socket. Move your weight back to equal on both legs, HOLD/FEEL  pressure in socket. Repeat.  Start with both hands on sink, then right hand only, then left hand only  Moving Cones / Cups: With equal weight on each leg: Hold on with one hand the first time, then progress to no hand supports. Move cups from one side of sink to the other. Place cups ~2" out of your reach, progress to 10" beyond reach.  Place one hand in middle of sink and reach with other hand. Do both arms.  Then hover one hand and move cups with other hand.  Overhead/Upward Reaching: alternated reaching up to top cabinets or ceiling if no cabinets present. Keep equal weight on each leg. Start with one hand support on counter while other hand reaches and progress to no  hand support with reaching.  ace one hand in middle of sink and reach with other hand. Do both arms.   Looking Over Shoulders: With equal weight on each leg: alternate turning to look over your shoulders with one hand support on counter as needed.  Start with head motions only to look in front of shoulder, then even with shoulder and progress to looking behind you. To look to side, move head /eyes, then shoulder on side looking pulls back, shift more weight to side looking and pull hip back. Place one hand in middle of sink and let go with other hand so your shoulder can pull back. Switch hands to look other way. If looking right, use left hand at sink. If looking left, use right hand at sink. 6.  Stepping with leg tapping forefoot into lower cabinet:  Move items under cabinet out of your way. Shift your hips/pelvis so weight on right side. Tighten muscles in hip on right side.  SLOWLY step left leg so front of foot is in cabinet. Then step back to floor.  Repeat with other leg

## 2021-11-22 NOTE — Progress Notes (Signed)
   11/22/21 1244  PT Education  Education Details [facility]P at [facility]  Person(s) Educated Patient;Spouse  Methods Explanation;Demonstration;Tactile cues;Verbal cues;Handout  Comprehension Verbalized understanding;Returned demonstration;Verbal cues required;Tactile cues required;Need further instruction

## 2021-11-23 ENCOUNTER — Ambulatory Visit: Payer: Self-pay

## 2021-11-23 NOTE — Patient Instructions (Signed)
Visit Information  Thank you for taking time to visit with me today. Please don't hesitate to contact me if I can be of assistance to you.   Following are the goals we discussed today:   Goals Addressed               This Visit's Progress     Patient Stated     COMPLETED: I would like an Advanced Directive (pt-stated)        Care Coordination Interventions: Follow up call placed to the patient to confirm receipt of mailed resource Confirmed the patient did receive advance directive packet but is not yet ready to complete Encouraged the patient to contact SW as needed if questions arise regarding completion        Please call the care guide team at [phone] if you need to schedule an appointment with our care coordination team  If you are experiencing a Mental Health or [place] or need someone to talk to, please call [phone]-TALK (toll free, 24 hour hotline)  Patient verbalizes understanding of instructions and care plan provided today and agrees to view in [facility]. Active MyChart status and patient understanding of how to access instructions and care plan via MyChart confirmed with patient.     No further follow up required: Please contact your primary care provider as needed  Daneen Schick, BSW, CDP Social Worker, Certified Dementia Practitioner Care Coordination [phone]

## 2021-11-23 NOTE — Patient Outreach (Signed)
  Care Coordination   Follow Up Visit Note   11/23/2021 Name: [name] MRN: [id number] DOB: 01-24-1951  [name] is a 71 y.o. year old male who sees Lorenda Hatchet, [place] for primary care. I spoke with  [name] by phone today  What matters to the patients health and wellness today?  Learn more about advance care planning    Goals Addressed               This Visit's Progress     Patient Stated     COMPLETED: I would like an Advanced Directive (pt-stated)        Care Coordination Interventions: Follow up call placed to the patient to confirm receipt of mailed resource Confirmed the patient did receive advance directive packet but is not yet ready to complete Encouraged the patient to contact SW as needed if questions arise regarding completion        SDOH assessments and interventions completed:  No     Care Coordination Interventions Activated:  Yes  Care Coordination Interventions:  Yes, provided   Follow up plan: No further intervention required.   Encounter Outcome:  Pt. Visit Completed   Daneen Schick, BSW, CDP Social Worker, Certified Dementia Practitioner Care Coordination [phone]

## 2021-11-26 ENCOUNTER — Ambulatory Visit (INDEPENDENT_AMBULATORY_CARE_PROVIDER_SITE_OTHER): Payer: Medicare Other | Admitting: Physical Therapy

## 2021-11-26 ENCOUNTER — Encounter: Payer: Self-pay | Admitting: Physical Therapy

## 2021-11-26 DIAGNOSIS — R293 Abnormal posture: Secondary | ICD-10-CM

## 2021-11-26 DIAGNOSIS — R2681 Unsteadiness on feet: Secondary | ICD-10-CM | POA: Diagnosis not present

## 2021-11-26 DIAGNOSIS — M79605 Pain in left leg: Secondary | ICD-10-CM

## 2021-11-26 DIAGNOSIS — M6281 Muscle weakness (generalized): Secondary | ICD-10-CM | POA: Diagnosis not present

## 2021-11-26 DIAGNOSIS — R2689 Other abnormalities of gait and mobility: Secondary | ICD-10-CM

## 2021-11-26 DIAGNOSIS — L98498 Non-pressure chronic ulcer of skin of other sites with other specified severity: Secondary | ICD-10-CM

## 2021-11-26 DIAGNOSIS — M25662 Stiffness of left knee, not elsewhere classified: Secondary | ICD-10-CM

## 2021-11-26 NOTE — Therapy (Signed)
OUTPATIENT PHYSICAL THERAPY TREATMENT NOTE   Patient Name: [name] MRN: [id number] DOB:Dec 11, 1950, 71 y.o., male Today's Date: 11/26/2021  PCP: Lorenda Hatchet, FNP REFERRING PROVIDER: [name], MD  END OF SESSION:   PT End of Session - 11/26/21 1258     Visit Number 4    Number of Visits 25    Date for PT Re-Evaluation 02/07/22    Authorization Type Medicare and BCBS    Progress Note Due on Visit 10    PT Start Time 1300    PT Stop Time 1345    PT Time Calculation (min) 45 min    Equipment Utilized During Treatment Gait belt    Activity Tolerance Patient limited by fatigue;Patient limited by pain    Behavior During Therapy [facility] for tasks assessed/performed               Past Medical History:  Diagnosis Date   Ambulates with cane    straight cane   Anemia    Cervical myelopathy ([place]) 02/06/2018   Chronic kidney disease    dailysis M W F- home   Complication of anesthesia    Coronary artery disease    Diabetes mellitus without complication ([place])    type2   Diabetic foot ulcer ([place])    Diabetic neuropathy ([place]) 02/06/2018   Gait abnormality 08/24/2018   GERD (gastroesophageal reflux disease)     01/06/20- not current   Gout    History of blood transfusion    History of blood transfusion    History of kidney stones    passed stones   Hypercholesteremia    Hypertension    Hypothyroidism    Neuromuscular disorder ([place])    neuropathy left leg and bilateral feet   Neuropathy    PONV (postoperative nausea and vomiting)    Prostate cancer ([place])    PVD (peripheral vascular disease) ([place])    with amputations   Past Surgical History:  Procedure Laterality Date   A-FLUTTER ABLATION N/A 04/06/2020   Procedure: A-FLUTTER ABLATION;  Surgeon: [name], MD;  Location: [facility];  Service: Cardiovascular;  Laterality: N/A;   AMPUTATION Left 12/25/2013   Procedure: AMPUTATION RAY LEFT 5TH RAY;  Surgeon: [name], MD;  Location: [facility];  Service: Orthopedics;  Laterality: Left;   AMPUTATION Right 12/15/2018   Procedure: AMPUTATION OF 4TH AND 5TH TOES RIGHT FOOT;  Surgeon: [name], MD;  Location: [place];  Service: Orthopedics;  Laterality: Right;   AMPUTATION Left 02/02/2021   Procedure: LEFT FOOT 4TH RAY AMPUTATION;  Surgeon: [name], MD;  Location: [place];  Service: Orthopedics;  Laterality: Left;   AMPUTATION Left 05/09/2021   Procedure: LEFT BELOW KNEE AMPUTATION;  Surgeon: [name], MD;  Location: [place];  Service: Orthopedics;  Laterality: Left;   APPLICATION OF WOUND VAC Right 12/15/2018   Procedure: APPLICATION OF WOUND VAC;  Surgeon: [name], MD;  Location: [place];  Service: Orthopedics;  Laterality: Right;   AV FISTULA PLACEMENT Left 03/25/2019   Procedure: LEFT ARM ARTERIOVENOUS (AV) FISTULA CREATION;  Surgeon: [name], MD;  Location: [place];  Service: Vascular;  Laterality: Left;   [place] Left 05/20/2019   Procedure: SECOND STAGE LEFT BASCILIC VEIN TRANSPOSITION;  Surgeon: [name], MD;  Location: [place];  Service: Vascular;  Laterality: Left;   CARDIAC CATHETERIZATION  02/17/2014   CHOLECYSTECTOMY  COLONOSCOPY  2011   in [place], Normal   CORONARY ARTERY BYPASS GRAFT  2008   CYSTOSCOPY N/A 06/29/2020   Procedure: CYSTOSCOPY WITH CLOT EVACUATION AND  FULGERATION;  Surgeon: [name], MD;  Location: [place];  Service: Urology;  Laterality: N/A;   CYSTOSCOPY WITH FULGERATION Bilateral 06/17/2020   Procedure: CYSTOSCOPY,BILATERAL RETROGRADE, CLOT EVACUATION WITH FULGERATION OF THE BLADDER;  Surgeon: Janith Lima, MD;  Location: [place];  Service: Urology;  Laterality: Bilateral;   CYSTOSCOPY WITH FULGERATION N/A 06/28/2020   Procedure: CYSTOSCOPY WITH CLOT EVACUATION AND FULGERATION OF BLEEDERS;  Surgeon: [name], MD;  Location: [place];  Service: Urology;  Laterality: N/A;  1 HR   I & D EXTREMITY Right 12/15/2018    Procedure: DEBRIDEMENT RIGHT FOOT;  Surgeon: [name], MD;  Location: [place];  Service: Orthopedics;  Laterality: Right;   I & D EXTREMITY Right 08/27/2019   Procedure: PARTIAL CUBOID EXCISION RIGHT FOOT;  Surgeon: [name], MD;  Location: [place];  Service: Orthopedics;  Laterality: Right;   I & D EXTREMITY Right 01/07/2020   Procedure: RIGHT FOOT EXCISION INFECTED BONE;  Surgeon: [name], MD;  Location: [place];  Service: Orthopedics;  Laterality: Right;   NECK SURGERY     novemver 2019   STUMP REVISION Left 06/27/2021   Procedure: REVISION LEFT BELOW KNEE AMPUTATION;  Surgeon: [name], MD;  Location: [place];  Service: Orthopedics;  Laterality: Left;   TRANSURETHRAL RESECTION OF PROSTATE N/A 06/28/2020   Procedure: TRANSURETHRAL RESECTION OF THE PROSTATE (TURP);  Surgeon: [name], MD;  Location: [place];  Service: Urology;  Laterality: N/A;   WISDOM TOOTH EXTRACTION     Patient Active Problem List   Diagnosis Date Noted   Community acquired pneumonia of left lower lobe of lung 08/18/2021   Paroxysmal atrial fibrillation ([place]) 08/18/2021   Anemia of chronic renal failure 08/18/2021   Thrombocytopenia ([place]) 62/83/1517   Acute metabolic encephalopathy 61/60/7371   Action induced myoclonus 08/18/2021   Dehiscence of amputation stump of left lower extremity ([place])    Left below-knee amputee ([place]) 05/11/2021   S/P BKA (below knee amputation) unilateral, left ([place]) 05/09/2021   Partial nontraumatic amputation of foot, left ([place]) 02/20/2021   Abscess of bursa of left foot 02/03/2021   Cutaneous abscess of left foot    Acute osteomyelitis of metatarsal bone of left foot (HCC)    Symptomatic anemia 09/13/2020   COVID-19 virus infection 09/13/2020   Pressure injury of skin 06/29/2020   Gross hematuria 06/16/2020   Typical atrial flutter ([place]) 03/24/2020   Bilateral pleural effusion 02/24/2020   Healthcare maintenance 01/28/2020   Shortness of breath 01/28/2020    History of partial ray amputation of fourth toe of right foot ([place]) 09/08/2019   Ulcerated, foot, right, with necrosis of bone (HCC)    Chronic cough 06/25/2019   Cutaneous abscess of right foot    Subacute osteomyelitis of right foot ([place]) 12/14/2018   AKI (acute kidney injury) ([place]) 12/14/2018   ESRD on hemodialysis ([address]) 12/14/2018   Gait abnormality 08/24/2018   Diabetic neuropathy ([place]) 02/06/2018   Cervical myelopathy ([place]) 02/06/2018   Onychomycosis 10/30/2017   Other spondylosis with radiculopathy, cervical region 01/27/2017   Midfoot ulcer, right, limited to breakdown of skin ([place]) 11/15/2016   Lateral epicondylitis, left elbow 08/12/2016   Cellulitis of fifth toe of right foot 07/29/2016   Prostate cancer ([place]) 06/19/2016   Non-pressure chronic ulcer of other part  of right foot limited to breakdown of skin ([place]) 04/04/2016   Cellulitis of leg, right 12/24/2015   Cellulitis of right lower extremity 12/24/2015   Gout 09/14/2012   Hypertension associated with diabetes ([place]) 09/14/2012   Hypothyroidism 09/14/2012   CAD (coronary artery disease) 09/14/2012   Insulin dependent type 2 diabetes mellitus ([place]) 09/14/2012   Hyperlipidemia associated with type 2 diabetes mellitus ([place])    Neuropathy ([place])     REFERRING DIAG: Q59.563O (ICD-10-CM) - Below-knee amputation of left lower extremity  THERAPY DIAG:  Unsteadiness on feet  Other abnormalities of gait and mobility  Abnormal posture  Muscle weakness (generalized)  Pain in left leg  Non-pressure chronic ulcer of skin of other sites with other specified severity (HCC)  Stiffness of left knee, not elsewhere classified  Rationale for Evaluation and Treatment Rehabilitation  PERTINENT HISTORY: braced right foot with cavovarus collapse, CAP without hypoxia, acute metabolic encephalopathy, ESRD on HD Sun/W/F, afib s/p ablation, DM II, neuropathy, HTN, HLD, hypothyroidism, anemia, prostate cancer s/p TUPP. cervical  myelopathy, gout, CAD w/CABG 2005  PRECAUTIONS: no BP in LUE, fall risk   SUBJECTIVE: He wore prosthesis 3 hrs 2x/day but still has burning on shin bone. He has used baby oil a couple of times only.  The HEP is going well but limits standing due to the burning.  He sees prosthetist on Thursday.   PAIN:  Are you having pain?  No   OBJECTIVE: (objective measures completed at initial evaluation unless otherwise dated) COGNITION: Overall cognitive status: Within functional limits for tasks assessed              SENSATION: Not tested   POSTURE: rounded shoulders, forward head, and anterior pelvic tilt - RLE resting position with tibial IR, severe foot supination and inversion with cavovarus collapse   LOWER EXTREMITY ROM:   ROM (A: AROM, P: PROM) Right eval Left eval  Hip flexion      Hip extension      Hip abduction      Hip adduction      Hip internal rotation      Hip external rotation      Knee flexion      Knee extension   Standing with RW and prosthesis  A: -16*  Ankle dorsiflexion      Ankle plantarflexion      Ankle inversion      Ankle eversion       (Blank rows = not tested)   LOWER EXTREMITY MMT:   MMT Right eval Left eval  Hip flexion      Hip extension      Hip abduction      Hip adduction      Hip internal rotation      Hip external rotation      Knee flexion      Knee extension      Ankle dorsiflexion      Ankle plantarflexion      Ankle inversion      Ankle eversion      (Blank rows = not tested)   Functional strength with standing and transfers 3- at hip and knee bil., with no motion at Rt ankle   TRANSFERS: Sit to stand: minA - fell backwards into chair upon first attempt, MinA for next attempt with RW stabilization and verbal cueing for foot placement Stand to sit: minA for controlling descent and stabilization   GAIT: Gait pattern:  adduction RLE with toe-in, step to pattern, decreased  step length- Right, decreased stance time- Right,  decreased stride length, decreased hip/knee flexion- Left, decreased ankle dorsiflexion- Right, Left hip hike, Right foot flat, knee flexed in stance- Right, knee flexed in stance- Left, lateral hip instability, lateral lean- Right, trunk flexed, and poor foot clearance- Right Distance walked: 35' Assistive device utilized: Environmental consultant - 2 wheeled & Left TTA prosthesis, right double upright AFO custom shoe Level of assistance: MinA to recover losses of balance with straight surface walking, ModA to recover losses of balance turning Lt, MaxA with loss of balance during turning Rt Comments: Rt lower leg positioning reduced foot clearance with catching Lt heel most steps, and promoted Rt lateral leaning/lateral loss of balance. Requires excessive BUE support on RW.   FUNCTIONAL TESTs:  Berg Balance Scale: 6/56, 13/56 with RW   [facility] PT Assessment - 11/13/21 0001                Standardized Balance Assessment    Standardized Balance Assessment Berg Balance Test          Berg Balance Test    Sit to Stand Needs moderate or maximal assist to stand   Berg task with RW = 1    Standing Unsupported Unable to stand 30 seconds unassisted   Berg task with RW = 2    Sitting with Back Unsupported but Feet Supported on Floor or Stool Able to sit safely and securely 2 minutes     Stand to Sit Needs assistance to sit   Berg task with RW = 2    Transfers Able to transfer with verbal cueing and /or supervision     Standing Unsupported with Eyes Closed Needs help to keep from falling   Berg task with RW = 0    Standing Unsupported with Feet Together Needs help to attain position and unable to hold for 15 seconds   Berg task with RW = 0    From Standing, Reach Forward with Outstretched Arm Loses balance while trying/requires external support   Berg task with RW = 1 (CGA reaching with Lt)    From Standing Position, Pick up Object from Floor Unable to try/needs assist to keep balance   Berg task with RW = 0    From  Standing Position, Turn to Look Behind Over each Shoulder Needs assist to keep from losing balance and falling   Berg task with RW = 0    Turn 360 Degrees Needs assistance while turning   Berg task with RW = 0    Standing Unsupported, Alternately Place Feet on Step/Stool Needs assistance to keep from falling or unable to try   Berg task with RW = 0    Standing Unsupported, One Foot in [employer] balance while stepping or standing   Berg task with RW = 0    Standing on One Leg Unable to try or needs assist to prevent fall   Berg task with RW = 1    Total Score 6     Berg comment: Berg tasks with RW = 13/56                     CURRENT PROSTHETIC WEAR ASSESSMENT: Patient is dependent with: skin check, residual limb care, care of non-amputated limb, prosthetic cleaning, ply sock cleaning, correct ply sock adjustment, proper wear schedule/adjustment, and proper weight-bearing schedule/adjustment Donning prosthesis: Min A and maximal cueing Doffing prosthesis: SBA Prosthetic wear tolerance: 30 minutes 1-2x/day 6 of 6 days since delivery  Prosthetic weight bearing tolerance: stood ~5 minutes with c/o tibial crest pain moderate level after weight bearing Edema: present with slow capillary refill Residual limb condition: 1.6 cm scab along incision, cylinderical shape, normal color & temperature, mildly dry skin, slight scar adhesion at distal tibia,  Prosthetic description: silicone liner with pin lock suspension, 2nd pelite foam liner, total contact socket, SACH foot     TODAY'S TREATMENT:  11/26/2021 Prosthetic Training with right Transtibial Prosthesis & left double upright AFO: No changes in skin.  PT recommended increasing wear to 3 hrs 3x/day with off 2 hours bw wears.  Initiate 1st wear upon arising.  Apply baby oil to area only that burns prior to each wear. Pt & wife verbalized understanding.  Discussed using prosthesis & AFO to enter bathroom. PT reviewed liner donning for pin  orientation. Pt verbalized better understanding.  PT demo & verbal cues on scar mobs at distal tibia and rationale. Pt verbalized & return demo understanding. PT demo & verbal cues on technique for sit to/from chairs without armrests using UEs to RW. Pt initially needed minA & constant verbal cues, progressed to min guard and moderated cueing.   Pt ambulated 15', 30' & 15' with RW with verbal & visual cues for proper step width with minA straight path & gentle turns.  PT demo & verbal cues to wife & pt on technique with BLE involvement.  Pt neg curb one rep with LLE lead & one rep RLE lead with modA / 2nd person for safety but not needed.  PT & pt noted no difference in lead limb.    11/22/2021 Prosthetic Training with right Transtibial Prosthesis & left double upright AFO: Pt arrived with pelite liner & prosthetic socket rotated on limb.  PT demo & verbal cues for proper rotation orienting patella in middle of relief.  Pt & wife verbalized understanding.  PT recommended increasing prosthesis wear to 3 hours 2x/day. Pt & wife verbalized understanding.  Neuromuscular Re-education: HEP designed to work on standing balance, standing endurance, proprioception retraining using limb-socket interface & upright posture with weight shifts from pelvis.  HOME EXERCISE PROGRAM:   11/22/21 1244  PT Education  Education Details HEP at sink  Person(s) Educated Patient;Spouse  Methods Explanation;Demonstration;Tactile cues;Verbal cues;Handout  Comprehension Verbalized understanding;Returned demonstration;Verbal cues required;Tactile cues required;Need further instruction    Do each exercise 1-2  times per day Do each exercise 5-10 repetitions Hold each exercise for 2 seconds to feel your location  AT [place].  Try to find this position when standing still for activities.   USE TAPE ON FLOOR TO MARK THE MIDLINE POSITION which is even  with middle of sink.  You also should try to feel with your limb pressure in socket.  You are trying to feel with limb what you used to feel with the bottom of your foot.  Side to Side Shift: Moving your hips only (not shoulders): move weight onto your left leg, HOLD/FEEL pressure in socket.  Move back to equal weight on each leg, HOLD/FEEL pressure in socket. Move weight onto your right leg, HOLD/FEEL pressure in socket. Move back to equal weight on each leg, HOLD/FEEL pressure in socket. Repeat.  Start with both hands on sink, then right hand only, then left hand only Front to Back Shift: Moving your hips only (not shoulders): move your weight forward onto your toes, HOLD/FEEL pressure in socket. Move your weight back to equal  Flat Foot on both legs, HOLD/FEEL  pressure in socket. Move your weight back onto your heels, HOLD/FEEL  pressure in socket. Move your weight back to equal on both legs, HOLD/FEEL  pressure in socket. Repeat.  Start with both hands on sink, then right hand only, then left hand only  Moving Cones / Cups: With equal weight on each leg: Hold on with one hand the first time, then progress to no hand supports. Move cups from one side of sink to the other. Place cups ~2" out of your reach, progress to 10" beyond reach.  Place one hand in middle of sink and reach with other hand. Do both arms.  Then hover one hand and move cups with other hand.  Overhead/Upward Reaching: alternated reaching up to top cabinets or ceiling if no cabinets present. Keep equal weight on each leg. Start with one hand support on counter while other hand reaches and progress to no hand support with reaching.  ace one hand in middle of sink and reach with other hand. Do both arms.   Looking Over Shoulders: With equal weight on each leg: alternate turning to look over your shoulders with one hand support on counter as needed.  Start with head motions only to look in front of shoulder, then even with shoulder and  progress to looking behind you. To look to side, move head /eyes, then shoulder on side looking pulls back, shift more weight to side looking and pull hip back. Place one hand in middle of sink and let go with other hand so your shoulder can pull back. Switch hands to look other way. If looking right, use left hand at sink. If looking left, use right hand at sink. 6.  Stepping with leg tapping forefoot into lower cabinet:  Move items under cabinet out of your way. Shift your hips/pelvis so weight on right side. Tighten muscles in hip on right side.  SLOWLY step left leg so front of foot is in cabinet. Then step back to floor.  Repeat with other leg      ASSESSMENT:   CLINICAL IMPRESSION: Pt continues to be limited in standing & gait tolerance by burning limb pain.  PT advised to try oil prior to each wear to assess if changes burning.  Pt improved sit to/from stand & gait with PT instruction & reps.    OBJECTIVE IMPAIRMENTS Abnormal gait, decreased activity tolerance, decreased balance, decreased coordination, decreased endurance, decreased knowledge of use of DME, decreased mobility, difficulty walking, decreased ROM, decreased strength, decreased safety awareness, increased edema, impaired flexibility, postural dysfunction, prosthetic dependency , and pain.    ACTIVITY LIMITATIONS bending, sitting, standing, squatting, stairs, transfers, bathing, dressing, hygiene/grooming, locomotion level, and prosthetic use   PARTICIPATION LIMITATIONS: cleaning, laundry, and community activity   PERSONAL FACTORS Fitness, Time since onset of injury/illness/exacerbation, and 3+ comorbidities: see PMH  are also affecting patient's functional outcome.    REHAB POTENTIAL: Good   CLINICAL DECISION MAKING: Evolving/moderate complexity   EVALUATION COMPLEXITY: Moderate     GOALS: Goals reviewed with patient? Yes   SHORT TERM GOALS: Target date: 12/14/21   Patient independent with donning/doffing  prosthesis. Baseline: see objective data Goal status: INITIAL   2.  Patient verbalize proper assessment of wound with prosthetic use.  Baseline: see objective data Goal status: INITIAL   3.  Patient wears prosthesis >/= 8 hours/day daily without increase in wound & limb pain </= 5/10 to facilitate functional mobility and safety with transfers. Baseline: see  objective data Goal status: INITIAL   4.  Patient able to transfer sit to/from stand chair with armrests with RW with supervision. Baseline: see objective data Goal status: INITIAL   5.  Patient ambulates 59' with RW & prosthesis / AFO with minA including negotiating around obstacles.  Baseline: see objective data Goal status: INITIAL     LONG TERM GOALS: Target date: 02/07/22   Patient wears prosthesis > 90% of waking hours with no limb pain or skin issues to promote function and safety. Baseline: see objective data Goal status: INITIAL   2.  Patient independent with all aspects of prosthetic care to enable safe utilization of prosthesis. Baseline: see objective data Goal status: INITIAL   3.  Patient ambulates 15' with LRAD safely and modified independent to allow for household mobility. Baseline: see objective data Goal status: INITIAL   4.  Patient able to ambulate 200' and navigate ramp and curb with LRAD safely with minA/ caregiver assist to allow for community mobility. Baseline: see objective data Goal status: INITIAL   5.  Berg balance score >/= 30/56 with RW to indicate low fall risk during household level balance requirements with UE support. Baseline: see objective data Goal status: INITIAL     PLAN: PT FREQUENCY: 2x/week   PT DURATION: other: 90 days   PLANNED INTERVENTIONS: Therapeutic exercises, Therapeutic activity, Neuromuscular re-education, Balance training, Gait training, Patient/Family education, Self Care, Stair training, Vestibular training, Orthotic/Fit training, Prosthetic training, DME  instructions, Moist heat, scar mobilization, Manual therapy, Re-evaluation, and physical performance tests   PLAN FOR NEXT SESSION: check limb including burning with increased wear, instruct in signs of sweating, standing balance activities, prosthetic gait with RW including curb   Jamey Reas, PT, DPT 11/26/2021, 1:52 PM

## 2021-11-28 ENCOUNTER — Encounter: Payer: Medicare Other | Admitting: Physical Therapy

## 2021-11-28 ENCOUNTER — Encounter: Payer: Self-pay | Admitting: Physical Therapy

## 2021-11-28 ENCOUNTER — Ambulatory Visit (INDEPENDENT_AMBULATORY_CARE_PROVIDER_SITE_OTHER): Payer: Medicare Other | Admitting: Physical Therapy

## 2021-11-28 DIAGNOSIS — M79605 Pain in left leg: Secondary | ICD-10-CM

## 2021-11-28 DIAGNOSIS — L98498 Non-pressure chronic ulcer of skin of other sites with other specified severity: Secondary | ICD-10-CM

## 2021-11-28 DIAGNOSIS — R293 Abnormal posture: Secondary | ICD-10-CM

## 2021-11-28 DIAGNOSIS — M25662 Stiffness of left knee, not elsewhere classified: Secondary | ICD-10-CM

## 2021-11-28 DIAGNOSIS — R2689 Other abnormalities of gait and mobility: Secondary | ICD-10-CM

## 2021-11-28 DIAGNOSIS — M6281 Muscle weakness (generalized): Secondary | ICD-10-CM | POA: Diagnosis not present

## 2021-11-28 DIAGNOSIS — R2681 Unsteadiness on feet: Secondary | ICD-10-CM | POA: Diagnosis not present

## 2021-11-28 NOTE — Therapy (Signed)
OUTPATIENT PHYSICAL THERAPY TREATMENT NOTE   Patient Name: [name] MRN: [id number] DOB:[birth year], 71 y.o., male Today's Date: 11/28/2021  PCP: Lorenda Hatchet, FNP REFERRING PROVIDER: [name], MD  END OF SESSION:   PT End of Session - 11/28/21 1110     Visit Number 5    Number of Visits 25    Date for PT Re-Evaluation 02/07/22    Authorization Type Medicare and BCBS    Progress Note Due on Visit 10    PT Start Time 1103    PT Stop Time 1150    PT Time Calculation (min) 47 min    Equipment Utilized During Treatment Gait belt    Activity Tolerance Patient limited by fatigue;Patient limited by pain    Behavior During Therapy [facility] for tasks assessed/performed             Past Medical History:  Diagnosis Date   Ambulates with cane    straight cane   Anemia    Cervical myelopathy ([place]) 02/06/2018   Chronic kidney disease    dailysis M W F- home   Complication of anesthesia    Coronary artery disease    Diabetes mellitus without complication ([place])    type2   Diabetic foot ulcer ([place])    Diabetic neuropathy ([place]) 02/06/2018   Gait abnormality 08/24/2018   GERD (gastroesophageal reflux disease)     01/06/20- not current   Gout    History of blood transfusion    History of blood transfusion    History of kidney stones    passed stones   Hypercholesteremia    Hypertension    Hypothyroidism    Neuromuscular disorder ([place])    neuropathy left leg and bilateral feet   Neuropathy    PONV (postoperative nausea and vomiting)    Prostate cancer ([place])    PVD (peripheral vascular disease) ([place])    with amputations   Past Surgical History:  Procedure Laterality Date   A-FLUTTER ABLATION N/A 04/06/2020   Procedure: A-FLUTTER ABLATION;  Surgeon: [name], MD;  Location: [facility];  Service: Cardiovascular;  Laterality: N/A;   AMPUTATION Left 12/25/2013   Procedure: AMPUTATION RAY LEFT 5TH RAY;  Surgeon: [name], MD;  Location: [facility];   Service: Orthopedics;  Laterality: Left;   AMPUTATION Right 12/15/2018   Procedure: AMPUTATION OF 4TH AND 5TH TOES RIGHT FOOT;  Surgeon: [name], MD;  Location: [facility];  Service: Orthopedics;  Laterality: Right;   AMPUTATION Left 02/02/2021   Procedure: LEFT FOOT 4TH RAY AMPUTATION;  Surgeon: [name], MD;  Location: [facility];  Service: Orthopedics;  Laterality: Left;   AMPUTATION Left 05/09/2021   Procedure: LEFT BELOW KNEE AMPUTATION;  Surgeon: [name], MD;  Location: [place];  Service: Orthopedics;  Laterality: Left;   APPLICATION OF WOUND VAC Right 12/15/2018   Procedure: APPLICATION OF WOUND VAC;  Surgeon: [name], MD;  Location: [facility];  Service: Orthopedics;  Laterality: Right;   AV FISTULA PLACEMENT Left 03/25/2019   Procedure: LEFT ARM ARTERIOVENOUS (AV) FISTULA CREATION;  Surgeon: [name], MD;  Location: [facility];  Service: Vascular;  Laterality: Left;   [place] Left 05/20/2019   Procedure: SECOND STAGE LEFT BASCILIC VEIN TRANSPOSITION;  Surgeon: [name], MD;  Location: [facility];  Service: Vascular;  Laterality: Left;   CARDIAC CATHETERIZATION  02/17/2014   CHOLECYSTECTOMY  COLONOSCOPY  2011   in [place], Normal   CORONARY ARTERY BYPASS GRAFT  2008   CYSTOSCOPY N/A 06/29/2020   Procedure: CYSTOSCOPY WITH CLOT EVACUATION AND  FULGERATION;  Surgeon: [name], MD;  Location: [facility];  Service: Urology;  Laterality: N/A;   CYSTOSCOPY WITH FULGERATION Bilateral 06/17/2020   Procedure: CYSTOSCOPY,BILATERAL RETROGRADE, CLOT EVACUATION WITH FULGERATION OF THE BLADDER;  Surgeon: Janith Lima, MD;  Location: [facility];  Service: Urology;  Laterality: Bilateral;   CYSTOSCOPY WITH FULGERATION N/A 06/28/2020   Procedure: CYSTOSCOPY WITH CLOT EVACUATION AND FULGERATION OF BLEEDERS;  Surgeon: [name], MD;  Location: [facility] [facility];  Service: Urology;  Laterality: N/A;  1 HR   I & D EXTREMITY Right 12/15/2018   Procedure:  DEBRIDEMENT RIGHT FOOT;  Surgeon: [name], MD;  Location: [facility];  Service: Orthopedics;  Laterality: Right;   I & D EXTREMITY Right 08/27/2019   Procedure: PARTIAL CUBOID EXCISION RIGHT FOOT;  Surgeon: [name], MD;  Location: [facility];  Service: Orthopedics;  Laterality: Right;   I & D EXTREMITY Right 01/07/2020   Procedure: RIGHT FOOT EXCISION INFECTED BONE;  Surgeon: [name], MD;  Location: [facility];  Service: Orthopedics;  Laterality: Right;   NECK SURGERY     novemver 2019   STUMP REVISION Left 06/27/2021   Procedure: REVISION LEFT BELOW KNEE AMPUTATION;  Surgeon: [name], MD;  Location: [facility];  Service: Orthopedics;  Laterality: Left;   TRANSURETHRAL RESECTION OF PROSTATE N/A 06/28/2020   Procedure: TRANSURETHRAL RESECTION OF THE PROSTATE (TURP);  Surgeon: [name], MD;  Location: [place];  Service: Urology;  Laterality: N/A;   WISDOM TOOTH EXTRACTION     Patient Active Problem List   Diagnosis Date Noted   Community acquired pneumonia of left lower lobe of lung 08/18/2021   Paroxysmal atrial fibrillation ([place]) 08/18/2021   Anemia of chronic renal failure 08/18/2021   Thrombocytopenia ([place]) 44/06/4740   Acute metabolic encephalopathy 59/56/3875   Action induced myoclonus 08/18/2021   Dehiscence of amputation stump of left lower extremity ([place])    Left below-knee amputee ([name]) 05/11/2021   S/P BKA (below knee amputation) unilateral, left ([place]) 05/09/2021   Partial nontraumatic amputation of foot, left ([place]) 02/20/2021   Abscess of bursa of left foot 02/03/2021   Cutaneous abscess of left foot    Acute osteomyelitis of metatarsal bone of left foot (HCC)    Symptomatic anemia 09/13/2020   COVID-19 virus infection 09/13/2020   Pressure injury of skin 06/29/2020   Gross hematuria 06/16/2020   Typical atrial flutter ([place]) 03/24/2020   Bilateral pleural effusion 02/24/2020   Healthcare maintenance 01/28/2020   Shortness of breath 01/28/2020   History of  partial ray amputation of fourth toe of right foot ([place]) 09/08/2019   Ulcerated, foot, right, with necrosis of bone (HCC)    Chronic cough 06/25/2019   Cutaneous abscess of right foot    Subacute osteomyelitis of right foot ([place]) 12/14/2018   AKI (acute kidney injury) ([place]) 12/14/2018   ESRD on hemodialysis ([place]) 12/14/2018   Gait abnormality 08/24/2018   Diabetic neuropathy ([place]) 02/06/2018   Cervical myelopathy ([place]) 02/06/2018   Onychomycosis 10/30/2017   Other spondylosis with radiculopathy, cervical region 01/27/2017   Midfoot ulcer, right, limited to breakdown of skin ([place]) 11/15/2016   Lateral epicondylitis, left elbow 08/12/2016   Cellulitis of fifth toe of right foot 07/29/2016   Prostate cancer ([place]) 06/19/2016   Non-pressure chronic ulcer of other part  of right foot limited to breakdown of skin ([place]) 04/04/2016   Cellulitis of leg, right 12/24/2015   Cellulitis of right lower extremity 12/24/2015   Gout 09/14/2012   Hypertension associated with diabetes ([place]) 09/14/2012   Hypothyroidism 09/14/2012   CAD (coronary artery disease) 09/14/2012   Insulin dependent type 2 diabetes mellitus ([place]) 09/14/2012   Hyperlipidemia associated with type 2 diabetes mellitus ([place])    Neuropathy ([place])     REFERRING DIAG: D17.616W (ICD-10-CM) - Below-knee amputation of left lower extremity  THERAPY DIAG:  Unsteadiness on feet  Other abnormalities of gait and mobility  Abnormal posture  Muscle weakness (generalized)  Pain in left leg  Non-pressure chronic ulcer of skin of other sites with other specified severity (HCC)  Stiffness of left knee, not elsewhere classified  Rationale for Evaluation and Treatment Rehabilitation  PERTINENT HISTORY: braced right foot with cavovarus collapse, CAP without hypoxia, acute metabolic encephalopathy, ESRD on HD Sun/W/F, afib s/p ablation, DM II, neuropathy, HTN, HLD, hypothyroidism, anemia, prostate cancer s/p TUPP. cervical myelopathy, gout,  CAD w/CABG 2005  PRECAUTIONS: no BP in LUE, fall risk   SUBJECTIVE: He has used the baby oil daily and noticed slight improvement in his sx. Since Monday he has been wearing the prosthesis 3 hrs 3x/day with 2 hours between.  PAIN:  Are you having pain?  No   OBJECTIVE: (objective measures completed at initial evaluation unless otherwise dated) COGNITION: Overall cognitive status: Within functional limits for tasks assessed              SENSATION: Not tested   POSTURE: rounded shoulders, forward head, and anterior pelvic tilt - RLE resting position with tibial IR, severe foot supination and inversion with cavovarus collapse   LOWER EXTREMITY ROM:   ROM (A: AROM, P: PROM) Right eval Left eval  Hip flexion      Hip extension      Hip abduction      Hip adduction      Hip internal rotation      Hip external rotation      Knee flexion      Knee extension   Standing with RW and prosthesis  A: -16*  Ankle dorsiflexion      Ankle plantarflexion      Ankle inversion      Ankle eversion       (Blank rows = not tested)   LOWER EXTREMITY MMT:   MMT Right eval Left eval  Hip flexion      Hip extension      Hip abduction      Hip adduction      Hip internal rotation      Hip external rotation      Knee flexion      Knee extension      Ankle dorsiflexion      Ankle plantarflexion      Ankle inversion      Ankle eversion      (Blank rows = not tested)   Functional strength with standing and transfers 3- at hip and knee bil., with no motion at Rt ankle   TRANSFERS: Sit to stand: minA - fell backwards into chair upon first attempt, MinA for next attempt with RW stabilization and verbal cueing for foot placement Stand to sit: minA for controlling descent and stabilization   GAIT: Gait pattern:  adduction RLE with toe-in, step to pattern, decreased step length- Right, decreased stance time- Right, decreased stride length, decreased hip/knee flexion- Left,  decreased ankle  dorsiflexion- Right, Left hip hike, Right foot flat, knee flexed in stance- Right, knee flexed in stance- Left, lateral hip instability, lateral lean- Right, trunk flexed, and poor foot clearance- Right Distance walked: 35' Assistive device utilized: Environmental consultant - 2 wheeled & Left TTA prosthesis, right double upright AFO custom shoe Level of assistance: MinA to recover losses of balance with straight surface walking, ModA to recover losses of balance turning Lt, MaxA with loss of balance during turning Rt Comments: Rt lower leg positioning reduced foot clearance with catching Lt heel most steps, and promoted Rt lateral leaning/lateral loss of balance. Requires excessive BUE support on RW.   FUNCTIONAL TESTs:  Berg Balance Scale: 6/56, 13/56 with RW   [facility] PT Assessment - 11/13/21 0001                Standardized Balance Assessment    Standardized Balance Assessment Berg Balance Test          Berg Balance Test    Sit to Stand Needs moderate or maximal assist to stand   Berg task with RW = 1    Standing Unsupported Unable to stand 30 seconds unassisted   Berg task with RW = 2    Sitting with Back Unsupported but Feet Supported on Floor or Stool Able to sit safely and securely 2 minutes     Stand to Sit Needs assistance to sit   Berg task with RW = 2    Transfers Able to transfer with verbal cueing and /or supervision     Standing Unsupported with Eyes Closed Needs help to keep from falling   Berg task with RW = 0    Standing Unsupported with Feet Together Needs help to attain position and unable to hold for 15 seconds   Berg task with RW = 0    From Standing, Reach Forward with Outstretched Arm Loses balance while trying/requires external support   Berg task with RW = 1 (CGA reaching with Lt)    From Standing Position, Pick up Object from Floor Unable to try/needs assist to keep balance   Berg task with RW = 0    From Standing Position, Turn to Look Behind Over each Shoulder Needs assist to keep  from losing balance and falling   Berg task with RW = 0    Turn 360 Degrees Needs assistance while turning   Berg task with RW = 0    Standing Unsupported, Alternately Place Feet on Step/Stool Needs assistance to keep from falling or unable to try   Berg task with RW = 0    Standing Unsupported, One Foot in [employer] balance while stepping or standing   Berg task with RW = 0    Standing on One Leg Unable to try or needs assist to prevent fall   Berg task with RW = 1    Total Score 6     Berg comment: Berg tasks with RW = 13/56                     CURRENT PROSTHETIC WEAR ASSESSMENT: Patient is dependent with: skin check, residual limb care, care of non-amputated limb, prosthetic cleaning, ply sock cleaning, correct ply sock adjustment, proper wear schedule/adjustment, and proper weight-bearing schedule/adjustment Donning prosthesis: Min A and maximal cueing Doffing prosthesis: SBA Prosthetic wear tolerance: 30 minutes 1-2x/day 6 of 6 days since delivery Prosthetic weight bearing tolerance: stood ~5 minutes with c/o tibial crest pain moderate level  after weight bearing Edema: present with slow capillary refill Residual limb condition: 1.6 cm scab along incision, cylinderical shape, normal color & temperature, mildly dry skin, slight scar adhesion at distal tibia,  Prosthetic description: silicone liner with pin lock suspension, 2nd pelite foam liner, total contact socket, SACH foot     TODAY'S TREATMENT:  11/28/2021 Prosthetic Training with right Transtibial Prosthesis & left double upright AFO: PT education and demo on donning prosthesis with visual cues for alignment, pt donned prothesis x 1  PT education on indications and management of sweating, pt and wife verbalized understanding Pt ambulated 25' x 2 with RW straight path & gentle turns with verbal & visual cues for proper step width, CGA with PT and CGA with wife with verbal and visual cueing to wife for proper guarding and  safety recommendations. Pt instructed to walk at home with RW and visual cueing with wife CGA for 20' distances in hallway between two chairs, pt and wife verbalized understanding Pt ascend and descend curb x 2 with RW and minA, significant verbal cueing for sequence and step placement Pt ascend and descend ramp x 1 following visual and verbal demo, pt required PT to control RW and 2n person modA for balance with assistance for stability at trunk and with walker and significant verbal cueing for technique Pt sit to/from stand x 3 at chair without armrests with minA to CGA, verbal cueing for sequencing, looking at chair, reaching back with one hand at a time to prevent loss of balance and control descent  11/26/2021 Prosthetic Training with right Transtibial Prosthesis & left double upright AFO: No changes in skin.  PT recommended increasing wear to 3 hrs 3x/day with off 2 hours bw wears.  Initiate 1st wear upon arising.  Apply baby oil to area only that burns prior to each wear. Pt & wife verbalized understanding.  Discussed using prosthesis & AFO to enter bathroom. PT reviewed liner donning for pin orientation. Pt verbalized better understanding.  PT demo & verbal cues on scar mobs at distal tibia and rationale. Pt verbalized & return demo understanding. PT demo & verbal cues on technique for sit to/from chairs without armrests using UEs to RW. Pt initially needed minA & constant verbal cues, progressed to min guard and moderated cueing.   Pt ambulated 15', 30' & 37' with RW with verbal & visual cues for proper step width with minA straight path & gentle turns.  PT demo & verbal cues to wife & pt on technique with BLE involvement.  Pt neg curb one rep with LLE lead & one rep RLE lead with modA / 2nd person for safety but not needed.  PT & pt noted no difference in lead limb.   11/22/2021 Prosthetic Training with right Transtibial Prosthesis & left double upright AFO: Pt arrived with pelite liner &  prosthetic socket rotated on limb.  PT demo & verbal cues for proper rotation orienting patella in middle of relief.  Pt & wife verbalized understanding.  PT recommended increasing prosthesis wear to 3 hours 2x/day. Pt & wife verbalized understanding.  Neuromuscular Re-education: HEP designed to work on standing balance, standing endurance, proprioception retraining using limb-socket interface & upright posture with weight shifts from pelvis.  HOME EXERCISE PROGRAM:   11/22/21 1244  PT Education  Education Details HEP at sink  Person(s) Educated Patient;Spouse  Methods Explanation;Demonstration;Tactile cues;Verbal cues;Handout  Comprehension Verbalized understanding;Returned demonstration;Verbal cues required;Tactile cues required;Need further instruction    Do each exercise 1-2  times  per day Do each exercise 5-10 repetitions Hold each exercise for 2 seconds to feel your location  AT [place].  Try to find this position when standing still for activities.   USE TAPE ON FLOOR TO MARK THE MIDLINE POSITION which is even with middle of sink.  You also should try to feel with your limb pressure in socket.  You are trying to feel with limb what you used to feel with the bottom of your foot.  Side to Side Shift: Moving your hips only (not shoulders): move weight onto your left leg, HOLD/FEEL pressure in socket.  Move back to equal weight on each leg, HOLD/FEEL pressure in socket. Move weight onto your right leg, HOLD/FEEL pressure in socket. Move back to equal weight on each leg, HOLD/FEEL pressure in socket. Repeat.  Start with both hands on sink, then right hand only, then left hand only Front to Back Shift: Moving your hips only (not shoulders): move your weight forward onto your toes, HOLD/FEEL pressure in socket. Move your weight back to equal Flat Foot on both legs, HOLD/FEEL  pressure in socket. Move your weight back  onto your heels, HOLD/FEEL  pressure in socket. Move your weight back to equal on both legs, HOLD/FEEL  pressure in socket. Repeat.  Start with both hands on sink, then right hand only, then left hand only  Moving Cones / Cups: With equal weight on each leg: Hold on with one hand the first time, then progress to no hand supports. Move cups from one side of sink to the other. Place cups ~2" out of your reach, progress to 10" beyond reach.  Place one hand in middle of sink and reach with other hand. Do both arms.  Then hover one hand and move cups with other hand.  Overhead/Upward Reaching: alternated reaching up to top cabinets or ceiling if no cabinets present. Keep equal weight on each leg. Start with one hand support on counter while other hand reaches and progress to no hand support with reaching.  ace one hand in middle of sink and reach with other hand. Do both arms.   Looking Over Shoulders: With equal weight on each leg: alternate turning to look over your shoulders with one hand support on counter as needed.  Start with head motions only to look in front of shoulder, then even with shoulder and progress to looking behind you. To look to side, move head /eyes, then shoulder on side looking pulls back, shift more weight to side looking and pull hip back. Place one hand in middle of sink and let go with other hand so your shoulder can pull back. Switch hands to look other way. If looking right, use left hand at sink. If looking left, use right hand at sink. 6.  Stepping with leg tapping forefoot into lower cabinet:  Move items under cabinet out of your way. Shift your hips/pelvis so weight on right side. Tighten muscles in hip on right side.  SLOWLY step left leg so front of foot is in cabinet. Then step back to floor.  Repeat with other leg      ASSESSMENT:   CLINICAL IMPRESSION: He continues to have burning pain with standing but reports it is slightly better with oil application. He is progressing  with gait and ramp/curb mobility, still requiring cueing and guarding. He and his wife were instructed in home set up for walking at home, including  distance, set up, visual cues on walker, and contact guarding by wife for safety and they will begin walking short distances at home with RW. He continues to benefit from skilled PT to address prosthetic care and mobility deficits.   OBJECTIVE IMPAIRMENTS Abnormal gait, decreased activity tolerance, decreased balance, decreased coordination, decreased endurance, decreased knowledge of use of DME, decreased mobility, difficulty walking, decreased ROM, decreased strength, decreased safety awareness, increased edema, impaired flexibility, postural dysfunction, prosthetic dependency , and pain.    ACTIVITY LIMITATIONS bending, sitting, standing, squatting, stairs, transfers, bathing, dressing, hygiene/grooming, locomotion level, and prosthetic use   PARTICIPATION LIMITATIONS: cleaning, laundry, and community activity   PERSONAL FACTORS Fitness, Time since onset of injury/illness/exacerbation, and 3+ comorbidities: see PMH  are also affecting patient's functional outcome.    REHAB POTENTIAL: Good   CLINICAL DECISION MAKING: Evolving/moderate complexity   EVALUATION COMPLEXITY: Moderate     GOALS: Goals reviewed with patient? Yes   SHORT TERM GOALS: Target date: 12/14/21   Patient independent with donning/doffing prosthesis. Baseline: see objective data Goal status: INITIAL   2.  Patient verbalize proper assessment of wound with prosthetic use.  Baseline: see objective data Goal status: INITIAL   3.  Patient wears prosthesis >/= 8 hours/day daily without increase in wound & limb pain </= 5/10 to facilitate functional mobility and safety with transfers. Baseline: see objective data Goal status: INITIAL   4.  Patient able to transfer sit to/from stand chair with armrests with RW with supervision. Baseline: see objective data Goal status:  INITIAL   5.  Patient ambulates 30' with RW & prosthesis / AFO with minA including negotiating around obstacles.  Baseline: see objective data Goal status: INITIAL     LONG TERM GOALS: Target date: 02/07/22   Patient wears prosthesis > 90% of waking hours with no limb pain or skin issues to promote function and safety. Baseline: see objective data Goal status: INITIAL   2.  Patient independent with all aspects of prosthetic care to enable safe utilization of prosthesis. Baseline: see objective data Goal status: INITIAL   3.  Patient ambulates 63' with LRAD safely and modified independent to allow for household mobility. Baseline: see objective data Goal status: INITIAL   4.  Patient able to ambulate 200' and navigate ramp and curb with LRAD safely with minA/ caregiver assist to allow for community mobility. Baseline: see objective data Goal status: INITIAL   5.  Berg balance score >/= 30/56 with RW to indicate low fall risk during household level balance requirements with UE support. Baseline: see objective data Goal status: INITIAL     PLAN: PT FREQUENCY: 2x/week   PT DURATION: other: 90 days   PLANNED INTERVENTIONS: Therapeutic exercises, Therapeutic activity, Neuromuscular re-education, Balance training, Gait training, Patient/Family education, Self Care, Stair training, Vestibular training, Orthotic/Fit training, Prosthetic training, DME instructions, Moist heat, scar mobilization, Manual therapy, Re-evaluation, and physical performance tests   PLAN FOR NEXT SESSION: check limb including burning with increased wear, standing balance activities, prosthetic gait with RW including curb, reinforce STS instructions as indicated   Jana Hakim, Student-PT 11/28/2021, 4:35 PM  This entire session of physical therapy was performed under the direct supervision of PT signing evaluation /treatment. PT reviewed note and agrees.  Jamey Reas, PT, DPT 11/28/2021, 4:36 PM

## 2021-12-03 ENCOUNTER — Encounter: Payer: Self-pay | Admitting: Physical Therapy

## 2021-12-03 ENCOUNTER — Ambulatory Visit (INDEPENDENT_AMBULATORY_CARE_PROVIDER_SITE_OTHER): Payer: Medicare Other | Admitting: Physical Therapy

## 2021-12-03 DIAGNOSIS — R2689 Other abnormalities of gait and mobility: Secondary | ICD-10-CM | POA: Diagnosis not present

## 2021-12-03 DIAGNOSIS — R293 Abnormal posture: Secondary | ICD-10-CM | POA: Diagnosis not present

## 2021-12-03 DIAGNOSIS — M6281 Muscle weakness (generalized): Secondary | ICD-10-CM | POA: Diagnosis not present

## 2021-12-03 DIAGNOSIS — M25662 Stiffness of left knee, not elsewhere classified: Secondary | ICD-10-CM

## 2021-12-03 DIAGNOSIS — L98498 Non-pressure chronic ulcer of skin of other sites with other specified severity: Secondary | ICD-10-CM

## 2021-12-03 DIAGNOSIS — R2681 Unsteadiness on feet: Secondary | ICD-10-CM | POA: Diagnosis not present

## 2021-12-03 DIAGNOSIS — M79605 Pain in left leg: Secondary | ICD-10-CM

## 2021-12-03 NOTE — Therapy (Signed)
OUTPATIENT PHYSICAL THERAPY TREATMENT NOTE   Patient Name: [name] MRN: [id number] DOB:[birth year], 71 y.o., male Today's Date: 12/03/2021  PCP: Lorenda Hatchet, FNP REFERRING PROVIDER: [name], MD  END OF SESSION:   PT End of Session - 12/03/21 1141     Visit Number 6    Number of Visits 25    Date for PT Re-Evaluation 02/07/22    Authorization Type Medicare and BCBS    Progress Note Due on Visit 10    PT Start Time 1141    Equipment Utilized During Treatment Gait belt    Activity Tolerance Patient limited by fatigue;Patient limited by pain    Behavior During Therapy [facility] for tasks assessed/performed              Past Medical History:  Diagnosis Date   Ambulates with cane    straight cane   Anemia    Cervical myelopathy ([place]) 02/06/2018   Chronic kidney disease    dailysis M W F- home   Complication of anesthesia    Coronary artery disease    Diabetes mellitus without complication ([place])    type2   Diabetic foot ulcer ([place])    Diabetic neuropathy ([place]) 02/06/2018   Gait abnormality 08/24/2018   GERD (gastroesophageal reflux disease)     01/06/20- not current   Gout    History of blood transfusion    History of blood transfusion    History of kidney stones    passed stones   Hypercholesteremia    Hypertension    Hypothyroidism    Neuromuscular disorder ([name])    neuropathy left leg and bilateral feet   Neuropathy    PONV (postoperative nausea and vomiting)    Prostate cancer ([place])    PVD (peripheral vascular disease) ([place])    with amputations   Past Surgical History:  Procedure Laterality Date   A-FLUTTER ABLATION N/A 04/06/2020   Procedure: A-FLUTTER ABLATION;  Surgeon: [name], MD;  Location: [facility];  Service: Cardiovascular;  Laterality: N/A;   AMPUTATION Left 12/25/2013   Procedure: AMPUTATION RAY LEFT 5TH RAY;  Surgeon: [name], MD;  Location: [facility];  Service: Orthopedics;  Laterality: Left;   AMPUTATION  Right 12/15/2018   Procedure: AMPUTATION OF 4TH AND 5TH TOES RIGHT FOOT;  Surgeon: [name], MD;  Location: [place];  Service: Orthopedics;  Laterality: Right;   AMPUTATION Left 02/02/2021   Procedure: LEFT FOOT 4TH RAY AMPUTATION;  Surgeon: [name], MD;  Location: [place];  Service: Orthopedics;  Laterality: Left;   AMPUTATION Left 05/09/2021   Procedure: LEFT BELOW KNEE AMPUTATION;  Surgeon: [name], MD;  Location: [place];  Service: Orthopedics;  Laterality: Left;   APPLICATION OF WOUND VAC Right 12/15/2018   Procedure: APPLICATION OF WOUND VAC;  Surgeon: [name], MD;  Location: [place];  Service: Orthopedics;  Laterality: Right;   AV FISTULA PLACEMENT Left 03/25/2019   Procedure: LEFT ARM ARTERIOVENOUS (AV) FISTULA CREATION;  Surgeon: [name], MD;  Location: [place];  Service: Vascular;  Laterality: Left;   [name] Left 05/20/2019   Procedure: SECOND STAGE LEFT BASCILIC VEIN TRANSPOSITION;  Surgeon: [name], MD;  Location: [place] [facility];  Service: Vascular;  Laterality: Left;   CARDIAC CATHETERIZATION  02/17/2014   CHOLECYSTECTOMY     COLONOSCOPY  2011   in [place], [place]  2008   CYSTOSCOPY N/A 06/29/2020   Procedure: CYSTOSCOPY WITH CLOT EVACUATION AND  FULGERATION;  Surgeon: [name], MD;  Location: [place];  Service: Urology;  Laterality: N/A;   CYSTOSCOPY WITH FULGERATION Bilateral 06/17/2020   Procedure: CYSTOSCOPY,BILATERAL RETROGRADE, CLOT EVACUATION WITH FULGERATION OF THE BLADDER;  Surgeon: Janith Lima, MD;  Location: [place];  Service: Urology;  Laterality: Bilateral;   CYSTOSCOPY WITH FULGERATION N/A 06/28/2020   Procedure: CYSTOSCOPY WITH CLOT EVACUATION AND FULGERATION OF BLEEDERS;  Surgeon: [name], MD;  Location: [place];  Service: Urology;  Laterality: N/A;  1 HR   I & D EXTREMITY Right 12/15/2018   Procedure: DEBRIDEMENT RIGHT FOOT;  Surgeon: [name], MD;   Location: [place];  Service: Orthopedics;  Laterality: Right;   I & D EXTREMITY Right 08/27/2019   Procedure: PARTIAL CUBOID EXCISION RIGHT FOOT;  Surgeon: [name], MD;  Location: [place];  Service: Orthopedics;  Laterality: Right;   I & D EXTREMITY Right 01/07/2020   Procedure: RIGHT FOOT EXCISION INFECTED BONE;  Surgeon: [name], MD;  Location: [place];  Service: Orthopedics;  Laterality: Right;   NECK SURGERY     novemver 2019   STUMP REVISION Left 06/27/2021   Procedure: REVISION LEFT BELOW KNEE AMPUTATION;  Surgeon: [name], MD;  Location: [place];  Service: Orthopedics;  Laterality: Left;   TRANSURETHRAL RESECTION OF PROSTATE N/A 06/28/2020   Procedure: TRANSURETHRAL RESECTION OF THE PROSTATE (TURP);  Surgeon: [name], MD;  Location: [place];  Service: Urology;  Laterality: N/A;   WISDOM TOOTH EXTRACTION     Patient Active Problem List   Diagnosis Date Noted   Community acquired pneumonia of left lower lobe of lung 08/18/2021   Paroxysmal atrial fibrillation ([place]) 08/18/2021   Anemia of chronic renal failure 08/18/2021   Thrombocytopenia ([place]) 13/11/6576   Acute metabolic encephalopathy 46/96/2952   Action induced myoclonus 08/18/2021   Dehiscence of amputation stump of left lower extremity ([place])    Left below-knee amputee ([place]) 05/11/2021   S/P BKA (below knee amputation) unilateral, left ([place]) 05/09/2021   Partial nontraumatic amputation of foot, left ([place]) 02/20/2021   Abscess of bursa of left foot 02/03/2021   Cutaneous abscess of left foot    Acute osteomyelitis of metatarsal bone of left foot (HCC)    Symptomatic anemia 09/13/2020   COVID-19 virus infection 09/13/2020   Pressure injury of skin 06/29/2020   Gross hematuria 06/16/2020   Typical atrial flutter ([place]) 03/24/2020   Bilateral pleural effusion 02/24/2020   Healthcare maintenance 01/28/2020   Shortness of breath 01/28/2020   History of partial ray amputation of fourth toe of right foot ([place])  09/08/2019   Ulcerated, foot, right, with necrosis of bone (HCC)    Chronic cough 06/25/2019   Cutaneous abscess of right foot    Subacute osteomyelitis of right foot ([place]) 12/14/2018   AKI (acute kidney injury) ([place]) 12/14/2018   ESRD on hemodialysis ([place]) 12/14/2018   Gait abnormality 08/24/2018   Diabetic neuropathy ([place]) 02/06/2018   Cervical myelopathy ([place]) 02/06/2018   Onychomycosis 10/30/2017   Other spondylosis with radiculopathy, cervical region 01/27/2017   Midfoot ulcer, right, limited to breakdown of skin ([place]) 11/15/2016   Lateral epicondylitis, left elbow 08/12/2016   Cellulitis of fifth toe of right foot 07/29/2016   Prostate cancer ([place]) 06/19/2016   Non-pressure chronic ulcer of other part of right foot limited to breakdown of skin ([place]) 04/04/2016   Cellulitis of leg, right  12/24/2015   Cellulitis of right lower extremity 12/24/2015   Gout 09/14/2012   Hypertension associated with diabetes ([place]) 09/14/2012   Hypothyroidism 09/14/2012   CAD (coronary artery disease) 09/14/2012   Insulin dependent type 2 diabetes mellitus ([place]) 09/14/2012   Hyperlipidemia associated with type 2 diabetes mellitus ([place])    Neuropathy ([place])     REFERRING DIAG: E26.834H (ICD-10-CM) - Below-knee amputation of left lower extremity  THERAPY DIAG:  Unsteadiness on feet  Other abnormalities of gait and mobility  Abnormal posture  Muscle weakness (generalized)  Pain in left leg  Non-pressure chronic ulcer of skin of other sites with other specified severity (HCC)  Stiffness of left knee, not elsewhere classified  Rationale for Evaluation and Treatment Rehabilitation  PERTINENT HISTORY: braced right foot with cavovarus collapse, CAP without hypoxia, acute metabolic encephalopathy, ESRD on HD Sun/W/F, afib s/p ablation, DM II, neuropathy, HTN, HLD, hypothyroidism, anemia, prostate cancer s/p TUPP. cervical myelopathy, gout, CAD w/CABG 2005  PRECAUTIONS: no BP in LUE, fall risk    SUBJECTIVE:  He is wearing 3 hours on, with 2 hours off between wears for 3x/day.  He saw prosthetist who increased pads and using baby oil where it burns;  it helps.  He has been walking ~20' with wife home with RW  PAIN:  Are you having pain?  No   OBJECTIVE: (objective measures completed at initial evaluation unless otherwise dated) COGNITION: Overall cognitive status: Within functional limits for tasks assessed              SENSATION: Not tested   POSTURE: rounded shoulders, forward head, and anterior pelvic tilt - RLE resting position with tibial IR, severe foot supination and inversion with cavovarus collapse   LOWER EXTREMITY ROM:   ROM (A: AROM, P: PROM) Right eval Left eval  Hip flexion      Hip extension      Hip abduction      Hip adduction      Hip internal rotation      Hip external rotation      Knee flexion      Knee extension   Standing with RW and prosthesis  A: -16*  Ankle dorsiflexion      Ankle plantarflexion      Ankle inversion      Ankle eversion       (Blank rows = not tested)   LOWER EXTREMITY MMT:   MMT Right eval Left eval  Hip flexion      Hip extension      Hip abduction      Hip adduction      Hip internal rotation      Hip external rotation      Knee flexion      Knee extension      Ankle dorsiflexion      Ankle plantarflexion      Ankle inversion      Ankle eversion      (Blank rows = not tested)   Functional strength with standing and transfers 3- at hip and knee bil., with no motion at Rt ankle   TRANSFERS: Sit to stand: minA - fell backwards into chair upon first attempt, MinA for next attempt with RW stabilization and verbal cueing for foot placement Stand to sit: minA for controlling descent and stabilization   GAIT: Gait pattern:  adduction RLE with toe-in, step to pattern, decreased step length- Right, decreased stance time- Right, decreased stride length, decreased hip/knee flexion- Left, decreased ankle  dorsiflexion- Right, Left hip hike, Right foot flat, knee flexed in stance- Right, knee flexed in stance- Left, lateral hip instability, lateral lean- Right, trunk flexed, and poor foot clearance- Right Distance walked: 35' Assistive device utilized: Environmental consultant - 2 wheeled & Left TTA prosthesis, right double upright AFO custom shoe Level of assistance: MinA to recover losses of balance with straight surface walking, ModA to recover losses of balance turning Lt, MaxA with loss of balance during turning Rt Comments: Rt lower leg positioning reduced foot clearance with catching Lt heel most steps, and promoted Rt lateral leaning/lateral loss of balance. Requires excessive BUE support on RW.   FUNCTIONAL TESTs:  Berg Balance Scale: 6/56, 13/56 with RW   [facility] PT Assessment - 11/13/21 0001                Standardized Balance Assessment    Standardized Balance Assessment Berg Balance Test          Berg Balance Test    Sit to Stand Needs moderate or maximal assist to stand   Berg task with RW = 1    Standing Unsupported Unable to stand 30 seconds unassisted   Berg task with RW = 2    Sitting with Back Unsupported but Feet Supported on Floor or Stool Able to sit safely and securely 2 minutes     Stand to Sit Needs assistance to sit   Berg task with RW = 2    Transfers Able to transfer with verbal cueing and /or supervision     Standing Unsupported with Eyes Closed Needs help to keep from falling   Berg task with RW = 0    Standing Unsupported with Feet Together Needs help to attain position and unable to hold for 15 seconds   Berg task with RW = 0    From Standing, Reach Forward with Outstretched Arm Loses balance while trying/requires external support   Berg task with RW = 1 (CGA reaching with Lt)    From Standing Position, Pick up Object from Floor Unable to try/needs assist to keep balance   Berg task with RW = 0    From Standing Position, Turn to Look Behind Over each Shoulder Needs assist to keep  from losing balance and falling   Berg task with RW = 0    Turn 360 Degrees Needs assistance while turning   Berg task with RW = 0    Standing Unsupported, Alternately Place Feet on Step/Stool Needs assistance to keep from falling or unable to try   Berg task with RW = 0    Standing Unsupported, One Foot in [employer] balance while stepping or standing   Berg task with RW = 0    Standing on One Leg Unable to try or needs assist to prevent fall   Berg task with RW = 1    Total Score 6     Berg comment: Berg tasks with RW = 13/56                     CURRENT PROSTHETIC WEAR ASSESSMENT: Patient is dependent with: skin check, residual limb care, care of non-amputated limb, prosthetic cleaning, ply sock cleaning, correct ply sock adjustment, proper wear schedule/adjustment, and proper weight-bearing schedule/adjustment Donning prosthesis: Min A and maximal cueing Doffing prosthesis: SBA Prosthetic wear tolerance: 30 minutes 1-2x/day 6 of 6 days since delivery Prosthetic weight bearing tolerance: stood ~5 minutes with c/o tibial crest pain moderate level after weight bearing  Edema: present with slow capillary refill Residual limb condition: 1.6 cm scab along incision, cylinderical shape, normal color & temperature, mildly dry skin, slight scar adhesion at distal tibia,  Prosthetic description: silicone liner with pin lock suspension, 2nd pelite foam liner, total contact socket, SACH foot     TODAY'S TREATMENT:  12/03/2021 Prosthetic Training with right Transtibial Prosthesis & left double upright AFO: Pt reports less burning with baby oil & increase in size of pretibial pads. No skin issues noted.  PT recommended increasing wear to 4hrs 3x/day with off 2 hours between wears. Pt & wife verbalized understanding. PT instructed in signs of sweating and need to pat limb & liner dry.  Pt & wife verbalized understanding  Sit to/from stand w/c & chair without armrests to RW with moderate verbal cues  & no physical assist / supervision. Pt amb 50' including ascend curb & descend ramp before seated rest, then ascend ramp & descend curb for 58' with minA (~5% PT assisting). PT verbal cues on technique.   PT verbal cues on increasing frequency of short (20' - 30') walks with expectation that less on dialysis days. PT demo & verbal cues how to safely assist and bring w/c for Long walk which is his max tolerable with recommendation 1-2 x/day. Pt & wife verbalized understanding. PT weighed prosthesis including liner which is 2.5kg to subtract for weigh-in & weigh-out dialysis. PT demo & verbal cues for stepping on/off scales and decreasing WB on RW. Pt & wife verbalized understanding.     11/28/2021 Prosthetic Training with right Transtibial Prosthesis & left double upright AFO: PT education and demo on donning prosthesis with visual cues for alignment, pt donned prothesis x 1  PT education on indications and management of sweating, pt and wife verbalized understanding Pt ambulated 25' x 2 with RW straight path & gentle turns with verbal & visual cues for proper step width, CGA with PT and CGA with wife with verbal and visual cueing to wife for proper guarding and safety recommendations. Pt instructed to walk at home with RW and visual cueing with wife CGA for 20' distances in hallway between two chairs, pt and wife verbalized understanding Pt ascend and descend curb x 2 with RW and minA, significant verbal cueing for sequence and step placement Pt ascend and descend ramp x 1 following visual and verbal demo, pt required PT to control RW and 2n person modA for balance with assistance for stability at trunk and with walker and significant verbal cueing for technique Pt sit to/from stand x 3 at chair without armrests with minA to CGA, verbal cueing for sequencing, looking at chair, reaching back with one hand at a time to prevent loss of balance and control descent  11/26/2021 Prosthetic Training with  right Transtibial Prosthesis & left double upright AFO: No changes in skin.  PT recommended increasing wear to 3 hrs 3x/day with off 2 hours bw wears.  Initiate 1st wear upon arising.  Apply baby oil to area only that burns prior to each wear. Pt & wife verbalized understanding.  Discussed using prosthesis & AFO to enter bathroom. PT reviewed liner donning for pin orientation. Pt verbalized better understanding.  PT demo & verbal cues on scar mobs at distal tibia and rationale. Pt verbalized & return demo understanding. PT demo & verbal cues on technique for sit to/from chairs without armrests using UEs to RW. Pt initially needed minA & constant verbal cues, progressed to min guard and moderated cueing.  Pt ambulated 15', 30' & 71' with RW with verbal & visual cues for proper step width with minA straight path & gentle turns.  PT demo & verbal cues to wife & pt on technique with BLE involvement.  Pt neg curb one rep with LLE lead & one rep RLE lead with modA / 2nd person for safety but not needed.  PT & pt noted no difference in lead limb.    HOME EXERCISE PROGRAM:   11/22/21 1244  PT Education  Education Details HEP at sink  Person(s) Educated Patient;Spouse  Methods Explanation;Demonstration;Tactile cues;Verbal cues;Handout  Comprehension Verbalized understanding;Returned demonstration;Verbal cues required;Tactile cues required;Need further instruction    Do each exercise 1-2  times per day Do each exercise 5-10 repetitions Hold each exercise for 2 seconds to feel your location  AT [place].  Try to find this position when standing still for activities.   USE TAPE ON FLOOR TO MARK THE MIDLINE POSITION which is even with middle of sink.  You also should try to feel with your limb pressure in socket.  You are trying to feel with limb what you used to feel with the bottom of your foot.  Side to Side Shift: Moving your  hips only (not shoulders): move weight onto your left leg, HOLD/FEEL pressure in socket.  Move back to equal weight on each leg, HOLD/FEEL pressure in socket. Move weight onto your right leg, HOLD/FEEL pressure in socket. Move back to equal weight on each leg, HOLD/FEEL pressure in socket. Repeat.  Start with both hands on sink, then right hand only, then left hand only Front to Back Shift: Moving your hips only (not shoulders): move your weight forward onto your toes, HOLD/FEEL pressure in socket. Move your weight back to equal Flat Foot on both legs, HOLD/FEEL  pressure in socket. Move your weight back onto your heels, HOLD/FEEL  pressure in socket. Move your weight back to equal on both legs, HOLD/FEEL  pressure in socket. Repeat.  Start with both hands on sink, then right hand only, then left hand only  Moving Cones / Cups: With equal weight on each leg: Hold on with one hand the first time, then progress to no hand supports. Move cups from one side of sink to the other. Place cups ~2" out of your reach, progress to 10" beyond reach.  Place one hand in middle of sink and reach with other hand. Do both arms.  Then hover one hand and move cups with other hand.  Overhead/Upward Reaching: alternated reaching up to top cabinets or ceiling if no cabinets present. Keep equal weight on each leg. Start with one hand support on counter while other hand reaches and progress to no hand support with reaching.  ace one hand in middle of sink and reach with other hand. Do both arms.   Looking Over Shoulders: With equal weight on each leg: alternate turning to look over your shoulders with one hand support on counter as needed.  Start with head motions only to look in front of shoulder, then even with shoulder and progress to looking behind you. To look to side, move head /eyes, then shoulder on side looking pulls back, shift more weight to side looking and pull hip back. Place one hand in middle of sink and let go with  other hand so your shoulder can pull back. Switch hands to look other way. If looking right, use left hand  at sink. If looking left, use right hand at sink. 6.  Stepping with leg tapping forefoot into lower cabinet:  Move items under cabinet out of your way. Shift your hips/pelvis so weight on right side. Tighten muscles in hip on right side.  SLOWLY step left leg so front of foot is in cabinet. Then step back to floor.  Repeat with other leg      ASSESSMENT:   CLINICAL IMPRESSION: Patient is increasing his activity tolerance slowly with PT recommendations.  His wife appears to understand how to guard / assist for gait to enable mobility outside of PT.  He reports that visual cues of band on RW has significant impact on his gait stability.    OBJECTIVE IMPAIRMENTS Abnormal gait, decreased activity tolerance, decreased balance, decreased coordination, decreased endurance, decreased knowledge of use of DME, decreased mobility, difficulty walking, decreased ROM, decreased strength, decreased safety awareness, increased edema, impaired flexibility, postural dysfunction, prosthetic dependency , and pain.    ACTIVITY LIMITATIONS bending, sitting, standing, squatting, stairs, transfers, bathing, dressing, hygiene/grooming, locomotion level, and prosthetic use   PARTICIPATION LIMITATIONS: cleaning, laundry, and community activity   PERSONAL FACTORS Fitness, Time since onset of injury/illness/exacerbation, and 3+ comorbidities: see PMH  are also affecting patient's functional outcome.    REHAB POTENTIAL: Good   CLINICAL DECISION MAKING: Evolving/moderate complexity   EVALUATION COMPLEXITY: Moderate     GOALS: Goals reviewed with patient? Yes   SHORT TERM GOALS: Target date: 12/14/21   Patient independent with donning/doffing prosthesis. Baseline: see objective data Goal status: INITIAL   2.  Patient verbalize proper assessment of wound with prosthetic use.  Baseline: see objective data Goal  status: INITIAL   3.  Patient wears prosthesis >/= 8 hours/day daily without increase in wound & limb pain </= 5/10 to facilitate functional mobility and safety with transfers. Baseline: see objective data Goal status: INITIAL   4.  Patient able to transfer sit to/from stand chair with armrests with RW with supervision. Baseline: see objective data Goal status: INITIAL   5.  Patient ambulates 47' with RW & prosthesis / AFO with minA including negotiating around obstacles.  Baseline: see objective data Goal status: INITIAL     LONG TERM GOALS: Target date: 02/07/22   Patient wears prosthesis > 90% of waking hours with no limb pain or skin issues to promote function and safety. Baseline: see objective data Goal status: INITIAL   2.  Patient independent with all aspects of prosthetic care to enable safe utilization of prosthesis. Baseline: see objective data Goal status: INITIAL   3.  Patient ambulates 47' with LRAD safely and modified independent to allow for household mobility. Baseline: see objective data Goal status: INITIAL   4.  Patient able to ambulate 200' and navigate ramp and curb with LRAD safely with minA/ caregiver assist to allow for community mobility. Baseline: see objective data Goal status: INITIAL   5.  Berg balance score >/= 30/56 with RW to indicate low fall risk during household level balance requirements with UE support. Baseline: see objective data Goal status: INITIAL     PLAN: PT FREQUENCY: 2x/week   PT DURATION: other: 90 days   PLANNED INTERVENTIONS: Therapeutic exercises, Therapeutic activity, Neuromuscular re-education, Balance training, Gait training, Patient/Family education, Self Care, Stair training, Vestibular training, Orthotic/Fit training, Prosthetic training, DME instructions, Moist heat, scar mobilization, Manual therapy, Re-evaluation, and physical performance tests   PLAN FOR NEXT SESSION:    check limb including burning with  increased wear, standing  balance activities, prosthetic gait with RW including curb & ramp with wife's assistance   Jamey Reas, PT, DPT 12/03/2021, 12:37 PM

## 2021-12-05 ENCOUNTER — Ambulatory Visit (INDEPENDENT_AMBULATORY_CARE_PROVIDER_SITE_OTHER): Payer: Medicare Other | Admitting: Physical Therapy

## 2021-12-05 ENCOUNTER — Encounter: Payer: Medicare Other | Admitting: Physical Therapy

## 2021-12-05 ENCOUNTER — Encounter: Payer: Self-pay | Admitting: Physical Therapy

## 2021-12-05 DIAGNOSIS — R293 Abnormal posture: Secondary | ICD-10-CM

## 2021-12-05 DIAGNOSIS — R2689 Other abnormalities of gait and mobility: Secondary | ICD-10-CM

## 2021-12-05 DIAGNOSIS — M6281 Muscle weakness (generalized): Secondary | ICD-10-CM

## 2021-12-05 DIAGNOSIS — M25662 Stiffness of left knee, not elsewhere classified: Secondary | ICD-10-CM

## 2021-12-05 DIAGNOSIS — R2681 Unsteadiness on feet: Secondary | ICD-10-CM | POA: Diagnosis not present

## 2021-12-05 DIAGNOSIS — L98498 Non-pressure chronic ulcer of skin of other sites with other specified severity: Secondary | ICD-10-CM

## 2021-12-05 DIAGNOSIS — M79605 Pain in left leg: Secondary | ICD-10-CM

## 2021-12-05 NOTE — Therapy (Signed)
OUTPATIENT PHYSICAL THERAPY TREATMENT NOTE   Patient Name: [name] MRN: [id number] DOB:Sep 02, 1950, 71 y.o., male Today's Date: 12/05/2021  PCP: Lorenda Hatchet, FNP REFERRING PROVIDER: [name], MD  END OF SESSION:   PT End of Session - 12/05/21 1103     Visit Number 7    Number of Visits 25    Date for PT Re-Evaluation 02/07/22    Authorization Type Medicare and BCBS    Progress Note Due on Visit 10    PT Start Time 1059    PT Stop Time 1142    PT Time Calculation (min) 43 min    Equipment Utilized During Treatment Gait belt    Activity Tolerance Patient limited by fatigue;Patient limited by pain    Behavior During Therapy WFL for tasks assessed/performed             Past Medical History:  Diagnosis Date   Ambulates with cane    straight cane   Anemia    Cervical myelopathy ([place]) 02/06/2018   Chronic kidney disease    dailysis M W F- home   Complication of anesthesia    Coronary artery disease    Diabetes mellitus without complication ([place])    type2   Diabetic foot ulcer ([place])    Diabetic neuropathy ([place]) 02/06/2018   Gait abnormality 08/24/2018   GERD (gastroesophageal reflux disease)     01/06/20- not current   Gout    History of blood transfusion    History of blood transfusion    History of kidney stones    passed stones   Hypercholesteremia    Hypertension    Hypothyroidism    Neuromuscular disorder ([place])    neuropathy left leg and bilateral feet   Neuropathy    PONV (postoperative nausea and vomiting)    Prostate cancer ([place])    PVD (peripheral vascular disease) ([place])    with amputations   Past Surgical History:  Procedure Laterality Date   A-FLUTTER ABLATION N/A 04/06/2020   Procedure: A-FLUTTER ABLATION;  Surgeon: [name], MD;  Location: [facility];  Service: Cardiovascular;  Laterality: N/A;   AMPUTATION Left 12/25/2013   Procedure: AMPUTATION RAY LEFT 5TH RAY;  Surgeon: [name], MD;  Location: [facility];   Service: Orthopedics;  Laterality: Left;   AMPUTATION Right 12/15/2018   Procedure: AMPUTATION OF 4TH AND 5TH TOES RIGHT FOOT;  Surgeon: [name], MD;  Location: [facility];  Service: Orthopedics;  Laterality: Right;   AMPUTATION Left 02/02/2021   Procedure: LEFT FOOT 4TH RAY AMPUTATION;  Surgeon: [name], MD;  Location: [facility];  Service: Orthopedics;  Laterality: Left;   AMPUTATION Left 05/09/2021   Procedure: LEFT BELOW KNEE AMPUTATION;  Surgeon: [name], MD;  Location: [facility];  Service: Orthopedics;  Laterality: Left;   APPLICATION OF WOUND VAC Right 12/15/2018   Procedure: APPLICATION OF WOUND VAC;  Surgeon: [name], MD;  Location: [facility];  Service: Orthopedics;  Laterality: Right;   AV FISTULA PLACEMENT Left 03/25/2019   Procedure: LEFT ARM ARTERIOVENOUS (AV) FISTULA CREATION;  Surgeon: [name], MD;  Location: [facility];  Service: Vascular;  Laterality: Left;   [place] Left 05/20/2019   Procedure: SECOND STAGE LEFT BASCILIC VEIN TRANSPOSITION;  Surgeon: [name], MD;  Location: [facility];  Service: Vascular;  Laterality: Left;   CARDIAC CATHETERIZATION  02/17/2014   CHOLECYSTECTOMY  COLONOSCOPY  2011   in [place], Normal   CORONARY ARTERY BYPASS GRAFT  2008   CYSTOSCOPY N/A 06/29/2020   Procedure: CYSTOSCOPY WITH CLOT EVACUATION AND  FULGERATION;  Surgeon: [name], MD;  Location: [facility];  Service: Urology;  Laterality: N/A;   CYSTOSCOPY WITH FULGERATION Bilateral 06/17/2020   Procedure: CYSTOSCOPY,BILATERAL RETROGRADE, CLOT EVACUATION WITH FULGERATION OF THE BLADDER;  Surgeon: Janith Lima, MD;  Location: [facility];  Service: Urology;  Laterality: Bilateral;   CYSTOSCOPY WITH FULGERATION N/A 06/28/2020   Procedure: CYSTOSCOPY WITH CLOT EVACUATION AND FULGERATION OF BLEEDERS;  Surgeon: [name], MD;  Location: [facility];  Service: Urology;  Laterality: N/A;  1 HR   I & D EXTREMITY Right 12/15/2018   Procedure:  DEBRIDEMENT RIGHT FOOT;  Surgeon: [name], MD;  Location: [facility];  Service: Orthopedics;  Laterality: Right;   I & D EXTREMITY Right 08/27/2019   Procedure: PARTIAL CUBOID EXCISION RIGHT FOOT;  Surgeon: [name], MD;  Location: [facility];  Service: Orthopedics;  Laterality: Right;   I & D EXTREMITY Right 01/07/2020   Procedure: RIGHT FOOT EXCISION INFECTED BONE;  Surgeon: [name], MD;  Location: [facility];  Service: Orthopedics;  Laterality: Right;   NECK SURGERY     novemver 2019   STUMP REVISION Left 06/27/2021   Procedure: REVISION LEFT BELOW KNEE AMPUTATION;  Surgeon: [name], MD;  Location: [facility];  Service: Orthopedics;  Laterality: Left;   TRANSURETHRAL RESECTION OF PROSTATE N/A 06/28/2020   Procedure: TRANSURETHRAL RESECTION OF THE PROSTATE (TURP);  Surgeon: [name], MD;  Location: [facility];  Service: Urology;  Laterality: N/A;   WISDOM TOOTH EXTRACTION     Patient Active Problem List   Diagnosis Date Noted   Community acquired pneumonia of left lower lobe of lung 08/18/2021   Paroxysmal atrial fibrillation ([place]) 08/18/2021   Anemia of chronic renal failure 08/18/2021   Thrombocytopenia ([place]) 44/06/4740   Acute metabolic encephalopathy 59/56/3875   Action induced myoclonus 08/18/2021   Dehiscence of amputation stump of left lower extremity ([place])    Left below-knee amputee ([name]) 05/11/2021   S/P BKA (below knee amputation) unilateral, left ([place]) 05/09/2021   Partial nontraumatic amputation of foot, left ([place]) 02/20/2021   Abscess of bursa of left foot 02/03/2021   Cutaneous abscess of left foot    Acute osteomyelitis of metatarsal bone of left foot (HCC)    Symptomatic anemia 09/13/2020   COVID-19 virus infection 09/13/2020   Pressure injury of skin 06/29/2020   Gross hematuria 06/16/2020   Typical atrial flutter ([place]) 03/24/2020   Bilateral pleural effusion 02/24/2020   Healthcare maintenance 01/28/2020   Shortness of breath 01/28/2020   History of  partial ray amputation of fourth toe of right foot ([place]) 09/08/2019   Ulcerated, foot, right, with necrosis of bone (HCC)    Chronic cough 06/25/2019   Cutaneous abscess of right foot    Subacute osteomyelitis of right foot ([place]) 12/14/2018   AKI (acute kidney injury) ([place]) 12/14/2018   ESRD on hemodialysis ([place]) 12/14/2018   Gait abnormality 08/24/2018   Diabetic neuropathy ([place]) 02/06/2018   Cervical myelopathy ([place]) 02/06/2018   Onychomycosis 10/30/2017   Other spondylosis with radiculopathy, cervical region 01/27/2017   Midfoot ulcer, right, limited to breakdown of skin ([place]) 11/15/2016   Lateral epicondylitis, left elbow 08/12/2016   Cellulitis of fifth toe of right foot 07/29/2016   Prostate cancer ([place]) 06/19/2016   Non-pressure chronic ulcer of other part  of right foot limited to breakdown of skin ([place]) 04/04/2016   Cellulitis of leg, right 12/24/2015   Cellulitis of right lower extremity 12/24/2015   Gout 09/14/2012   Hypertension associated with diabetes ([place]) 09/14/2012   Hypothyroidism 09/14/2012   CAD (coronary artery disease) 09/14/2012   Insulin dependent type 2 diabetes mellitus ([place]) 09/14/2012   Hyperlipidemia associated with type 2 diabetes mellitus ([place])    Neuropathy ([place])     REFERRING DIAG: U83.729M (ICD-10-CM) - Below-knee amputation of left lower extremity  THERAPY DIAG:  Unsteadiness on feet  Other abnormalities of gait and mobility  Abnormal posture  Muscle weakness (generalized)  Pain in left leg  Non-pressure chronic ulcer of skin of other sites with other specified severity (HCC)  Stiffness of left knee, not elsewhere classified  Rationale for Evaluation and Treatment Rehabilitation  PERTINENT HISTORY: braced right foot with cavovarus collapse, CAP without hypoxia, acute metabolic encephalopathy, ESRD on HD Sun/W/F, afib s/p ablation, DM II, neuropathy, HTN, HLD, hypothyroidism, anemia, prostate cancer s/p TUPP. cervical myelopathy, gout,  CAD w/CABG 2005  PRECAUTIONS: no BP in LUE, fall risk   SUBJECTIVE:  He is wearing the prosthesis for at least 4 hours 2x/daily with 2 hours off. He walked 7' today and yesterday and walks with wife shadowing and chairs set up in home. The burning is better but still there, he is able to walk without much pain. He and his wife reported desire to get to seats at [facility] and use of rollator for seated breaks with fatigue. They have a 9" step to enter/exit den in his home.  PAIN:  Are you having pain?  No   OBJECTIVE: (objective measures completed at initial evaluation unless otherwise dated) COGNITION: Overall cognitive status: Within functional limits for tasks assessed              SENSATION: Not tested   POSTURE: rounded shoulders, forward head, and anterior pelvic tilt - RLE resting position with tibial IR, severe foot supination and inversion with cavovarus collapse   LOWER EXTREMITY ROM:   ROM (A: AROM, P: PROM) Right eval Left eval  Hip flexion      Hip extension      Hip abduction      Hip adduction      Hip internal rotation      Hip external rotation      Knee flexion      Knee extension   Standing with RW and prosthesis  A: -16*  Ankle dorsiflexion      Ankle plantarflexion      Ankle inversion      Ankle eversion       (Blank rows = not tested)   LOWER EXTREMITY MMT:   MMT Right eval Left eval  Hip flexion      Hip extension      Hip abduction      Hip adduction      Hip internal rotation      Hip external rotation      Knee flexion      Knee extension      Ankle dorsiflexion      Ankle plantarflexion      Ankle inversion      Ankle eversion      (Blank rows = not tested)   Functional strength with standing and transfers 3- at hip and knee bil., with no motion at Rt ankle   TRANSFERS: Sit to stand: minA - fell backwards into  chair upon first attempt, MinA for next attempt with RW stabilization and verbal cueing for foot  placement Stand to sit: minA for controlling descent and stabilization   GAIT: Gait pattern:  adduction RLE with toe-in, step to pattern, decreased step length- Right, decreased stance time- Right, decreased stride length, decreased hip/knee flexion- Left, decreased ankle dorsiflexion- Right, Left hip hike, Right foot flat, knee flexed in stance- Right, knee flexed in stance- Left, lateral hip instability, lateral lean- Right, trunk flexed, and poor foot clearance- Right Distance walked: 35' Assistive device utilized: Environmental consultant - 2 wheeled & Left TTA prosthesis, right double upright AFO custom shoe Level of assistance: MinA to recover losses of balance with straight surface walking, ModA to recover losses of balance turning Lt, MaxA with loss of balance during turning Rt Comments: Rt lower leg positioning reduced foot clearance with catching Lt heel most steps, and promoted Rt lateral leaning/lateral loss of balance. Requires excessive BUE support on RW.   FUNCTIONAL TESTs:  Berg Balance Scale: 6/56, 13/56 with RW   [facility] PT Assessment - 11/13/21 0001                Standardized Balance Assessment    Standardized Balance Assessment Berg Balance Test          Berg Balance Test    Sit to Stand Needs moderate or maximal assist to stand   Berg task with RW = 1    Standing Unsupported Unable to stand 30 seconds unassisted   Berg task with RW = 2    Sitting with Back Unsupported but Feet Supported on Floor or Stool Able to sit safely and securely 2 minutes     Stand to Sit Needs assistance to sit   Berg task with RW = 2    Transfers Able to transfer with verbal cueing and /or supervision     Standing Unsupported with Eyes Closed Needs help to keep from falling   Berg task with RW = 0    Standing Unsupported with Feet Together Needs help to attain position and unable to hold for 15 seconds   Berg task with RW = 0    From Standing, Reach Forward with Outstretched Arm Loses balance while  trying/requires external support   Berg task with RW = 1 (CGA reaching with Lt)    From Standing Position, Pick up Object from Floor Unable to try/needs assist to keep balance   Berg task with RW = 0    From Standing Position, Turn to Look Behind Over each Shoulder Needs assist to keep from losing balance and falling   Berg task with RW = 0    Turn 360 Degrees Needs assistance while turning   Berg task with RW = 0    Standing Unsupported, Alternately Place Feet on Step/Stool Needs assistance to keep from falling or unable to try   Berg task with RW = 0    Standing Unsupported, One Foot in [employer] balance while stepping or standing   Berg task with RW = 0    Standing on One Leg Unable to try or needs assist to prevent fall   Berg task with RW = 1    Total Score 6     Berg comment: Berg tasks with RW = 13/56                     CURRENT PROSTHETIC WEAR ASSESSMENT: Patient is dependent with: skin check, residual limb care, care of  non-amputated limb, prosthetic cleaning, ply sock cleaning, correct ply sock adjustment, proper wear schedule/adjustment, and proper weight-bearing schedule/adjustment Donning prosthesis: Min A and maximal cueing Doffing prosthesis: SBA Prosthetic wear tolerance: 30 minutes 1-2x/day 6 of 6 days since delivery Prosthetic weight bearing tolerance: stood ~5 minutes with c/o tibial crest pain moderate level after weight bearing Edema: present with slow capillary refill Residual limb condition: 1.6 cm scab along incision, cylinderical shape, normal color & temperature, mildly dry skin, slight scar adhesion at distal tibia,  Prosthetic description: silicone liner with pin lock suspension, 2nd pelite foam liner, total contact socket, SACH foot     TODAY'S TREATMENT:  12/05/2021 Prosthetic Training with left Transtibial Prosthesis & right double upright AFO: PT recommend increasing wear to all day with one 2 hour break in the middle of his waking hours with switching  liners upon the middle of the day and drying limb/liner midway of morning & afternoon wear, at least daily upon convenient time. Pt & wife verbalized understanding.  Pt sit to stand from w/c with RW SBA, stand to sit RW to w/c with cueing for bow and posterior reach Pt amb 150' with RW and visual cues for step placement and SBA before reporting fatigue with PT demo single person guarding and w/c follow. Goal is to walk max tolerable distance 1-2x/day which should increase over time.  Pt ascend 6" curb with LLE and RW and needed assistance recovering posterior loss of balance, descend RLE CGA Pt ascend and descend 8.75" curb with RW and sequence leading with RLE & down with LLE with PT CGA and x 1 with wife CGA Pt stand pivot transfer with RW, SBA and no cueing for form Pt reports high fatigue after today's session following gym attendance this morning and long walk in clinic  12/03/2021 Prosthetic Training with right Transtibial Prosthesis & left double upright AFO: Pt reports less burning with baby oil & increase in size of pretibial pads. No skin issues noted.  PT recommended increasing wear to 4hrs 3x/day with off 2 hours between wears. Pt & wife verbalized understanding. PT instructed in signs of sweating and need to pat limb & liner dry.  Pt & wife verbalized understanding  Sit to/from stand w/c & chair without armrests to RW with moderate verbal cues & no physical assist / supervision. Pt amb 50' including ascend curb & descend ramp before seated rest, then ascend ramp & descend curb for 28' with minA (~5% PT assisting). PT verbal cues on technique.   PT verbal cues on increasing frequency of short (20' - 30') walks with expectation that less on dialysis days. PT demo & verbal cues how to safely assist and bring w/c for Long walk which is his max tolerable with recommendation 1-2 x/day. Pt & wife verbalized understanding. PT weighed prosthesis including liner which is 2.5kg to subtract for  weigh-in & weigh-out dialysis. PT demo & verbal cues for stepping on/off scales and decreasing WB on RW. Pt & wife verbalized understanding.    11/28/2021 Prosthetic Training with right Transtibial Prosthesis & left double upright AFO: PT education and demo on donning prosthesis with visual cues for alignment, pt donned prothesis x 1  PT education on indications and management of sweating, pt and wife verbalized understanding Pt ambulated 25' x 2 with RW straight path & gentle turns with verbal & visual cues for proper step width, CGA with PT and CGA with wife with verbal and visual cueing to wife for proper guarding and safety  recommendations. Pt instructed to walk at home with RW and visual cueing with wife CGA for 20' distances in hallway between two chairs, pt and wife verbalized understanding Pt ascend and descend curb x 2 with RW and minA, significant verbal cueing for sequence and step placement Pt ascend and descend ramp x 1 following visual and verbal demo, pt required PT to control RW and 2n person modA for balance with assistance for stability at trunk and with walker and significant verbal cueing for technique Pt sit to/from stand x 3 at chair without armrests with minA to CGA, verbal cueing for sequencing, looking at chair, reaching back with one hand at a time to prevent loss of balance and control descent   HOME EXERCISE PROGRAM:   11/22/21 1244  PT Education  Education Details HEP at sink  Person(s) Educated Patient;Spouse  Methods Explanation;Demonstration;Tactile cues;Verbal cues;Handout  Comprehension Verbalized understanding;Returned demonstration;Verbal cues required;Tactile cues required;Need further instruction    Do each exercise 1-2  times per day Do each exercise 5-10 repetitions Hold each exercise for 2 seconds to feel your location  AT [place].  Try to find this position when standing still  for activities.   USE TAPE ON FLOOR TO MARK THE MIDLINE POSITION which is even with middle of sink.  You also should try to feel with your limb pressure in socket.  You are trying to feel with limb what you used to feel with the bottom of your foot.  Side to Side Shift: Moving your hips only (not shoulders): move weight onto your left leg, HOLD/FEEL pressure in socket.  Move back to equal weight on each leg, HOLD/FEEL pressure in socket. Move weight onto your right leg, HOLD/FEEL pressure in socket. Move back to equal weight on each leg, HOLD/FEEL pressure in socket. Repeat.  Start with both hands on sink, then right hand only, then left hand only Front to Back Shift: Moving your hips only (not shoulders): move your weight forward onto your toes, HOLD/FEEL pressure in socket. Move your weight back to equal Flat Foot on both legs, HOLD/FEEL  pressure in socket. Move your weight back onto your heels, HOLD/FEEL  pressure in socket. Move your weight back to equal on both legs, HOLD/FEEL  pressure in socket. Repeat.  Start with both hands on sink, then right hand only, then left hand only  Moving Cones / Cups: With equal weight on each leg: Hold on with one hand the first time, then progress to no hand supports. Move cups from one side of sink to the other. Place cups ~2" out of your reach, progress to 10" beyond reach.  Place one hand in middle of sink and reach with other hand. Do both arms.  Then hover one hand and move cups with other hand.  Overhead/Upward Reaching: alternated reaching up to top cabinets or ceiling if no cabinets present. Keep equal weight on each leg. Start with one hand support on counter while other hand reaches and progress to no hand support with reaching.  ace one hand in middle of sink and reach with other hand. Do both arms.   Looking Over Shoulders: With equal weight on each leg: alternate turning to look over your shoulders with one hand support on counter as needed.  Start with  head motions only to look in front of shoulder, then even with shoulder and progress to looking behind you. To look to side, move head /  eyes, then shoulder on side looking pulls back, shift more weight to side looking and pull hip back. Place one hand in middle of sink and let go with other hand so your shoulder can pull back. Switch hands to look other way. If looking right, use left hand at sink. If looking left, use right hand at sink. 6.  Stepping with leg tapping forefoot into lower cabinet:  Move items under cabinet out of your way. Shift your hips/pelvis so weight on right side. Tighten muscles in hip on right side.  SLOWLY step left leg so front of foot is in cabinet. Then step back to floor.  Repeat with other leg    ASSESSMENT:   CLINICAL IMPRESSION: He demonstrated increased walking tolerance today, reporting fatigue throughout session following longer walk and following walking this morning at gym. His wife appears safe to guard him with one 9" step to enter and exit den in their home with RW and stance on RLE. He continues to benefit from skilled PT.   OBJECTIVE IMPAIRMENTS Abnormal gait, decreased activity tolerance, decreased balance, decreased coordination, decreased endurance, decreased knowledge of use of DME, decreased mobility, difficulty walking, decreased ROM, decreased strength, decreased safety awareness, increased edema, impaired flexibility, postural dysfunction, prosthetic dependency , and pain.    ACTIVITY LIMITATIONS bending, sitting, standing, squatting, stairs, transfers, bathing, dressing, hygiene/grooming, locomotion level, and prosthetic use   PARTICIPATION LIMITATIONS: cleaning, laundry, and community activity   PERSONAL FACTORS Fitness, Time since onset of injury/illness/exacerbation, and 3+ comorbidities: see PMH  are also affecting patient's functional outcome.    REHAB POTENTIAL: Good   CLINICAL DECISION MAKING: Evolving/moderate complexity   EVALUATION  COMPLEXITY: Moderate     GOALS: Goals reviewed with patient? Yes   SHORT TERM GOALS: Target date: 12/14/21   Patient independent with donning/doffing prosthesis. Baseline: see objective data Goal status: INITIAL   2.  Patient verbalize proper assessment of wound with prosthetic use.  Baseline: see objective data Goal status: INITIAL   3.  Patient wears prosthesis >/= 8 hours/day daily without increase in wound & limb pain </= 5/10 to facilitate functional mobility and safety with transfers. Baseline: see objective data Goal status: met 12/05/21   4.  Patient able to transfer sit to/from stand chair with armrests with RW with supervision. Baseline: see objective data Goal status: met 12/05/21   5.  Patient ambulates 33' with RW & prosthesis / AFO with minA including negotiating around obstacles.  Baseline: see objective data Goal status: INITIAL     LONG TERM GOALS: Target date: 02/07/22   Patient wears prosthesis > 90% of waking hours with no limb pain or skin issues to promote function and safety. Baseline: see objective data Goal status: INITIAL   2.  Patient independent with all aspects of prosthetic care to enable safe utilization of prosthesis. Baseline: see objective data Goal status: INITIAL   3.  Patient ambulates 16' with LRAD safely and modified independent to allow for household mobility. Baseline: see objective data Goal status: INITIAL   4.  Patient able to ambulate 200' and navigate ramp and curb with LRAD safely with minA/ caregiver assist to allow for community mobility. Baseline: see objective data Goal status: INITIAL   5.  Berg balance score >/= 30/56 with RW to indicate low fall risk during household level balance requirements with UE support. Baseline: see objective data Goal status: INITIAL     PLAN: PT FREQUENCY: 2x/week   PT DURATION: other: 90 days   PLANNED  INTERVENTIONS: Therapeutic exercises, Therapeutic activity, Neuromuscular  re-education, Balance training, Gait training, Patient/Family education, Self Care, Stair training, Vestibular training, Orthotic/Fit training, Prosthetic training, DME instructions, Moist heat, scar mobilization, Manual therapy, Re-evaluation, and physical performance tests   PLAN FOR NEXT SESSION:   assess STGs this week, check limb including burning with increased wear, side stepping during gait with handhold support to resemble getting to seats, prosthetic gait with RW including ramp with wife's assistance and assess curb as indicated   Jana Hakim, Student-PT 12/05/2021, 12:20 PM  This entire session of physical therapy was performed under the direct supervision of PT signing evaluation /treatment. PT reviewed note and agrees.  Jamey Reas, PT, DPT 12/05/2021, 12:25 PM

## 2021-12-11 ENCOUNTER — Ambulatory Visit (INDEPENDENT_AMBULATORY_CARE_PROVIDER_SITE_OTHER): Payer: Medicare Other | Admitting: Physical Therapy

## 2021-12-11 ENCOUNTER — Encounter: Payer: Self-pay | Admitting: Physical Therapy

## 2021-12-11 DIAGNOSIS — R2689 Other abnormalities of gait and mobility: Secondary | ICD-10-CM

## 2021-12-11 DIAGNOSIS — M6281 Muscle weakness (generalized): Secondary | ICD-10-CM | POA: Diagnosis not present

## 2021-12-11 DIAGNOSIS — M25662 Stiffness of left knee, not elsewhere classified: Secondary | ICD-10-CM

## 2021-12-11 DIAGNOSIS — R2681 Unsteadiness on feet: Secondary | ICD-10-CM | POA: Diagnosis not present

## 2021-12-11 DIAGNOSIS — R293 Abnormal posture: Secondary | ICD-10-CM | POA: Diagnosis not present

## 2021-12-11 DIAGNOSIS — L98498 Non-pressure chronic ulcer of skin of other sites with other specified severity: Secondary | ICD-10-CM

## 2021-12-11 DIAGNOSIS — M79605 Pain in left leg: Secondary | ICD-10-CM

## 2021-12-11 NOTE — Therapy (Signed)
OUTPATIENT PHYSICAL THERAPY TREATMENT NOTE   Patient Name: [name] MRN: [id number] DOB:January 17, 1951, 71 y.o., male Today's Date: 12/11/2021  PCP: Lorenda Hatchet, FNP REFERRING PROVIDER: [name], MD  END OF SESSION:   PT End of Session - 12/11/21 1011     Visit Number 8    Number of Visits 25    Date for PT Re-Evaluation 02/07/22    Authorization Type Medicare and BCBS    Progress Note Due on Visit 10    PT Start Time 0847    PT Stop Time 0948    PT Time Calculation (min) 61 min    Equipment Utilized During Treatment Gait belt    Activity Tolerance Patient limited by fatigue;Patient limited by pain    Behavior During Therapy [facility] for tasks assessed/performed             Past Medical History:  Diagnosis Date   Ambulates with cane    straight cane   Anemia    Cervical myelopathy ([place]) 02/06/2018   Chronic kidney disease    dailysis M [facility] F- home   Complication of anesthesia    Coronary artery disease    Diabetes mellitus without complication ([place])    type2   Diabetic foot ulcer ([place])    Diabetic neuropathy ([place]) 02/06/2018   Gait abnormality 08/24/2018   GERD (gastroesophageal reflux disease)     01/06/20- not current   Gout    History of blood transfusion    History of blood transfusion    History of kidney stones    passed stones   Hypercholesteremia    Hypertension    Hypothyroidism    Neuromuscular disorder ([place])    neuropathy left leg and bilateral feet   Neuropathy    PONV (postoperative nausea and vomiting)    Prostate cancer ([place])    PVD (peripheral vascular disease) ([place])    with amputations   Past Surgical History:  Procedure Laterality Date   A-FLUTTER ABLATION N/A 04/06/2020   Procedure: A-FLUTTER ABLATION;  Surgeon: [name], MD;  Location: [facility];  Service: Cardiovascular;  Laterality: N/A;   AMPUTATION Left 12/25/2013   Procedure: AMPUTATION RAY LEFT 5TH RAY;  Surgeon: [name], MD;  Location: [facility];   Service: Orthopedics;  Laterality: Left;   AMPUTATION Right 12/15/2018   Procedure: AMPUTATION OF 4TH AND 5TH TOES RIGHT FOOT;  Surgeon: [name], MD;  Location: [facility];  Service: Orthopedics;  Laterality: Right;   AMPUTATION Left 02/02/2021   Procedure: LEFT FOOT 4TH RAY AMPUTATION;  Surgeon: [name], MD;  Location: [facility];  Service: Orthopedics;  Laterality: Left;   AMPUTATION Left 05/09/2021   Procedure: LEFT BELOW KNEE AMPUTATION;  Surgeon: [name], MD;  Location: [facility];  Service: Orthopedics;  Laterality: Left;   APPLICATION OF WOUND VAC Right 12/15/2018   Procedure: APPLICATION OF WOUND VAC;  Surgeon: [name], MD;  Location: [facility];  Service: Orthopedics;  Laterality: Right;   AV FISTULA PLACEMENT Left 03/25/2019   Procedure: LEFT ARM ARTERIOVENOUS (AV) FISTULA CREATION;  Surgeon: [name], MD;  Location: [facility];  Service: Vascular;  Laterality: Left;   [place] Left 05/20/2019   Procedure: SECOND STAGE LEFT BASCILIC VEIN TRANSPOSITION;  Surgeon: [name], MD;  Location: [facility];  Service: Vascular;  Laterality: Left;   CARDIAC CATHETERIZATION  02/17/2014   CHOLECYSTECTOMY  COLONOSCOPY  2011   in [place], Normal   CORONARY ARTERY BYPASS GRAFT  2008   CYSTOSCOPY N/A 06/29/2020   Procedure: CYSTOSCOPY WITH CLOT EVACUATION AND  FULGERATION;  Surgeon: [name], MD;  Location: [facility];  Service: Urology;  Laterality: N/A;   CYSTOSCOPY WITH FULGERATION Bilateral 06/17/2020   Procedure: CYSTOSCOPY,BILATERAL RETROGRADE, CLOT EVACUATION WITH FULGERATION OF THE BLADDER;  Surgeon: Janith Lima, MD;  Location: [facility];  Service: Urology;  Laterality: Bilateral;   CYSTOSCOPY WITH FULGERATION N/A 06/28/2020   Procedure: CYSTOSCOPY WITH CLOT EVACUATION AND FULGERATION OF BLEEDERS;  Surgeon: [name], MD;  Location: [facility];  Service: Urology;  Laterality: N/A;  1 HR   I & D EXTREMITY Right 12/15/2018   Procedure:  DEBRIDEMENT RIGHT FOOT;  Surgeon: [name], MD;  Location: [facility];  Service: Orthopedics;  Laterality: Right;   I & D EXTREMITY Right 08/27/2019   Procedure: PARTIAL CUBOID EXCISION RIGHT FOOT;  Surgeon: [name], MD;  Location: [facility];  Service: Orthopedics;  Laterality: Right;   I & D EXTREMITY Right 01/07/2020   Procedure: RIGHT FOOT EXCISION INFECTED BONE;  Surgeon: [name], MD;  Location: [facility];  Service: Orthopedics;  Laterality: Right;   NECK SURGERY     novemver 2019   STUMP REVISION Left 06/27/2021   Procedure: REVISION LEFT BELOW KNEE AMPUTATION;  Surgeon: [name], MD;  Location: [facility];  Service: Orthopedics;  Laterality: Left;   TRANSURETHRAL RESECTION OF PROSTATE N/A 06/28/2020   Procedure: TRANSURETHRAL RESECTION OF THE PROSTATE (TURP);  Surgeon: [name], MD;  Location: [place] [facility];  Service: Urology;  Laterality: N/A;   WISDOM TOOTH EXTRACTION     Patient Active Problem List   Diagnosis Date Noted   Community acquired pneumonia of left lower lobe of lung 08/18/2021   Paroxysmal atrial fibrillation ([place]) 08/18/2021   Anemia of chronic renal failure 08/18/2021   Thrombocytopenia ([place]) 44/06/4740   Acute metabolic encephalopathy 59/56/3875   Action induced myoclonus 08/18/2021   Dehiscence of amputation stump of left lower extremity ([place])    Left below-knee amputee ([name]) 05/11/2021   S/P BKA (below knee amputation) unilateral, left ([place]) 05/09/2021   Partial nontraumatic amputation of foot, left ([place]) 02/20/2021   Abscess of bursa of left foot 02/03/2021   Cutaneous abscess of left foot    Acute osteomyelitis of metatarsal bone of left foot (HCC)    Symptomatic anemia 09/13/2020   COVID-19 virus infection 09/13/2020   Pressure injury of skin 06/29/2020   Gross hematuria 06/16/2020   Typical atrial flutter ([place]) 03/24/2020   Bilateral pleural effusion 02/24/2020   Healthcare maintenance 01/28/2020   Shortness of breath 01/28/2020   History of  partial ray amputation of fourth toe of right foot ([place]) 09/08/2019   Ulcerated, foot, right, with necrosis of bone (HCC)    Chronic cough 06/25/2019   Cutaneous abscess of right foot    Subacute osteomyelitis of right foot ([place]) 12/14/2018   AKI (acute kidney injury) ([place]) 12/14/2018   ESRD on hemodialysis ([place]) 12/14/2018   Gait abnormality 08/24/2018   Diabetic neuropathy ([place]) 02/06/2018   Cervical myelopathy ([place]) 02/06/2018   Onychomycosis 10/30/2017   Other spondylosis with radiculopathy, cervical region 01/27/2017   Midfoot ulcer, right, limited to breakdown of skin ([place]) 11/15/2016   Lateral epicondylitis, left elbow 08/12/2016   Cellulitis of fifth toe of right foot 07/29/2016   Prostate cancer ([place]) 06/19/2016   Non-pressure chronic ulcer of other part  of right foot limited to breakdown of skin ([place]) 04/04/2016   Cellulitis of leg, right 12/24/2015   Cellulitis of right lower extremity 12/24/2015   Gout 09/14/2012   Hypertension associated with diabetes ([place]) 09/14/2012   Hypothyroidism 09/14/2012   CAD (coronary artery disease) 09/14/2012   Insulin dependent type 2 diabetes mellitus ([place]) 09/14/2012   Hyperlipidemia associated with type 2 diabetes mellitus ([place])    Neuropathy ([place])     REFERRING DIAG: Y30.160F (ICD-10-CM) - Below-knee amputation of left lower extremity  THERAPY DIAG:  Unsteadiness on feet  Other abnormalities of gait and mobility  Abnormal posture  Muscle weakness (generalized)  Pain in left leg  Non-pressure chronic ulcer of skin of other sites with other specified severity (HCC)  Stiffness of left knee, not elsewhere classified  Rationale for Evaluation and Treatment Rehabilitation  PERTINENT HISTORY: braced right foot with cavovarus collapse, CAP without hypoxia, acute metabolic encephalopathy, ESRD on HD Sun/W/F, afib s/p ablation, DM II, neuropathy, HTN, HLD, hypothyroidism, anemia, prostate cancer s/p TUPP. cervical myelopathy, gout,  CAD w/CABG 2005  PRECAUTIONS: no BP in LUE, fall risk   SUBJECTIVE: He reports new limb pain today that isn't the burning pain he has reported before. He wears the prosthesis all day with one break in the middle of the day and isn't taking it off for sweat management currently.  PAIN:  Are you having pain?  No   OBJECTIVE: (objective measures completed at initial evaluation unless otherwise dated) COGNITION: Overall cognitive status: Within functional limits for tasks assessed              SENSATION: Not tested   POSTURE: rounded shoulders, forward head, and anterior pelvic tilt - RLE resting position with tibial IR, severe foot supination and inversion with cavovarus collapse   LOWER EXTREMITY ROM:   ROM (A: AROM, P: PROM) Right eval Left eval  Hip flexion      Hip extension      Hip abduction      Hip adduction      Hip internal rotation      Hip external rotation      Knee flexion      Knee extension   Standing with RW and prosthesis  A: -16*  Ankle dorsiflexion      Ankle plantarflexion      Ankle inversion      Ankle eversion       (Blank rows = not tested)   LOWER EXTREMITY MMT:   MMT Right eval Left eval  Hip flexion      Hip extension      Hip abduction      Hip adduction      Hip internal rotation      Hip external rotation      Knee flexion      Knee extension      Ankle dorsiflexion      Ankle plantarflexion      Ankle inversion      Ankle eversion      (Blank rows = not tested)   Functional strength with standing and transfers 3- at hip and knee bil., with no motion at Rt ankle   TRANSFERS: Sit to stand: minA - fell backwards into chair upon first attempt, MinA for next attempt with RW stabilization and verbal cueing for foot placement Stand to sit: minA for controlling descent and stabilization   GAIT: Gait pattern:  adduction RLE with toe-in, step to pattern, decreased step length- Right, decreased  stance time- Right, decreased stride  length, decreased hip/knee flexion- Left, decreased ankle dorsiflexion- Right, Left hip hike, Right foot flat, knee flexed in stance- Right, knee flexed in stance- Left, lateral hip instability, lateral lean- Right, trunk flexed, and poor foot clearance- Right Distance walked: 35' Assistive device utilized: Environmental consultant - 2 wheeled & Left TTA prosthesis, right double upright AFO custom shoe Level of assistance: MinA to recover losses of balance with straight surface walking, ModA to recover losses of balance turning Lt, MaxA with loss of balance during turning Rt Comments: Rt lower leg positioning reduced foot clearance with catching Lt heel most steps, and promoted Rt lateral leaning/lateral loss of balance. Requires excessive BUE support on RW.   FUNCTIONAL TESTs:  Berg Balance Scale: 6/56, 13/56 with RW   [facility] PT Assessment - 11/13/21 0001                Standardized Balance Assessment    Standardized Balance Assessment Berg Balance Test          Berg Balance Test    Sit to Stand Needs moderate or maximal assist to stand   Berg task with RW = 1    Standing Unsupported Unable to stand 30 seconds unassisted   Berg task with RW = 2    Sitting with Back Unsupported but Feet Supported on Floor or Stool Able to sit safely and securely 2 minutes     Stand to Sit Needs assistance to sit   Berg task with RW = 2    Transfers Able to transfer with verbal cueing and /or supervision     Standing Unsupported with Eyes Closed Needs help to keep from falling   Berg task with RW = 0    Standing Unsupported with Feet Together Needs help to attain position and unable to hold for 15 seconds   Berg task with RW = 0    From Standing, Reach Forward with Outstretched Arm Loses balance while trying/requires external support   Berg task with RW = 1 (CGA reaching with Lt)    From Standing Position, Pick up Object from Floor Unable to try/needs assist to keep balance   Berg task with RW = 0    From Standing Position, Turn  to Look Behind Over each Shoulder Needs assist to keep from losing balance and falling   Berg task with RW = 0    Turn 360 Degrees Needs assistance while turning   Berg task with RW = 0    Standing Unsupported, Alternately Place Feet on Step/Stool Needs assistance to keep from falling or unable to try   Berg task with RW = 0    Standing Unsupported, One Foot in [employer] balance while stepping or standing   Berg task with RW = 0    Standing on One Leg Unable to try or needs assist to prevent fall   Berg task with RW = 1    Total Score 6     Berg comment: Berg tasks with RW = 13/56                     CURRENT PROSTHETIC WEAR ASSESSMENT: Patient is dependent with: skin check, residual limb care, care of non-amputated limb, prosthetic cleaning, ply sock cleaning, correct ply sock adjustment, proper wear schedule/adjustment, and proper weight-bearing schedule/adjustment Donning prosthesis: Min A and maximal cueing Doffing prosthesis: SBA Prosthetic wear tolerance: 30 minutes 1-2x/day 6 of 6 days since delivery Prosthetic weight bearing tolerance:  stood ~5 minutes with c/o tibial crest pain moderate level after weight bearing Edema: present with slow capillary refill Residual limb condition: 1.6 cm scab along incision, cylinderical shape, normal color & temperature, mildly dry skin, slight scar adhesion at distal tibia,  Prosthetic description: silicone liner with pin lock suspension, 2nd pelite foam liner, total contact socket, SACH foot     TODAY'S TREATMENT:  12/11/2021 Prosthetic Training with left Transtibial Prosthesis & right double upright AFO: PT education on sock bunching between liner and socket, pt verbalized understanding and returned demo of proper donning PT education on oil application only on spot of friction prior to donning liner, pt verbalized understanding PT suggestion to add regular sock to prosthetic foot inside shoe, pt verbalized understanding PT education on  timeline for socket revision, K level indications for prosthesis elements, potential for swimming and risk of wound development and pt and wife verbalized understanding Sit to/from stand from w/c with armrests and chair without armrests to RW with SBA, demonstrated decreased control of descent to chair without armrests Pt amb 100' with RW and visual cues for step placement weaving around tall obstacles with SBA Pt ascend and descend ramp with RW and wife and PT guarding with minA to control RW, pt reports high increase in Lt limb/knee pain. PT explain and demo ramp with RW and prosthesis with cues for hand and walker placement, foot placement, weight distrubution; pt and wife verbalize understanding Pt amb 6' to and from counter with RW to side step 8' Rt and Lt x 2 ea. with SBA/CGA following PT demo of weight shift and pushing to step, pt required cueing for upright posture Pt amb 25' with RW and visual cues for step placement with SBA PT demo & verbal cues on ascend and descend stairs with small base quad cane and handrail and with side stepping technique. Pt descend and ascend 2 steps with cane and Rt handrail and 2 steps with cane and Lt handrail ea. with minA (2nd person for safety) and verbal and tactile cueing for sequence  12/05/2021 Prosthetic Training with left Transtibial Prosthesis & right double upright AFO: PT recommend increasing wear to all day with one 2 hour break in the middle of his waking hours with switching liners upon the middle of the day and drying limb/liner midway of morning & afternoon wear, at least daily upon convenient time. Pt & wife verbalized understanding.  Pt sit to stand from w/c with RW SBA, stand to sit RW to w/c with cueing for bow and posterior reach Pt amb 150' with RW and visual cues for step placement and SBA before reporting fatigue with PT demo single person guarding and w/c follow. Goal is to walk max tolerable distance 1-2x/day which should increase over  time.  Pt ascend 6" curb with LLE and RW and needed assistance recovering posterior loss of balance, descend RLE CGA Pt ascend and descend 8.75" curb with RW and sequence leading with RLE & down with LLE with PT CGA and x 1 with wife CGA Pt stand pivot transfer with RW, SBA and no cueing for form Pt reports high fatigue after today's session following gym attendance this morning and long walk in clinic  12/03/2021 Prosthetic Training with right Transtibial Prosthesis & left double upright AFO: Pt reports less burning with baby oil & increase in size of pretibial pads. No skin issues noted.  PT recommended increasing wear to 4hrs 3x/day with off 2 hours between wears. Pt & wife verbalized understanding.  PT instructed in signs of sweating and need to pat limb & liner dry.  Pt & wife verbalized understanding  Sit to/from stand w/c & chair without armrests to RW with moderate verbal cues & no physical assist / supervision. Pt amb 50' including ascend curb & descend ramp before seated rest, then ascend ramp & descend curb for 52' with minA (~5% PT assisting). PT verbal cues on technique.   PT verbal cues on increasing frequency of short (20' - 30') walks with expectation that less on dialysis days. PT demo & verbal cues how to safely assist and bring w/c for Long walk which is his max tolerable with recommendation 1-2 x/day. Pt & wife verbalized understanding. PT weighed prosthesis including liner which is 2.5kg to subtract for weigh-in & weigh-out dialysis. PT demo & verbal cues for stepping on/off scales and decreasing WB on RW. Pt & wife verbalized understanding.    HOME EXERCISE PROGRAM:   11/22/21 1244  PT Education  Education Details HEP at sink  Person(s) Educated Patient;Spouse  Methods Explanation;Demonstration;Tactile cues;Verbal cues;Handout  Comprehension Verbalized understanding;Returned demonstration;Verbal cues required;Tactile cues required;Need further instruction    Do each  exercise 1-2  times per day Do each exercise 5-10 repetitions Hold each exercise for 2 seconds to feel your location  AT [place].  Try to find this position when standing still for activities.   USE TAPE ON FLOOR TO MARK THE MIDLINE POSITION which is even with middle of sink.  You also should try to feel with your limb pressure in socket.  You are trying to feel with limb what you used to feel with the bottom of your foot.  Side to Side Shift: Moving your hips only (not shoulders): move weight onto your left leg, HOLD/FEEL pressure in socket.  Move back to equal weight on each leg, HOLD/FEEL pressure in socket. Move weight onto your right leg, HOLD/FEEL pressure in socket. Move back to equal weight on each leg, HOLD/FEEL pressure in socket. Repeat.  Start with both hands on sink, then right hand only, then left hand only Front to Back Shift: Moving your hips only (not shoulders): move your weight forward onto your toes, HOLD/FEEL pressure in socket. Move your weight back to equal Flat Foot on both legs, HOLD/FEEL  pressure in socket. Move your weight back onto your heels, HOLD/FEEL  pressure in socket. Move your weight back to equal on both legs, HOLD/FEEL  pressure in socket. Repeat.  Start with both hands on sink, then right hand only, then left hand only  Moving Cones / Cups: With equal weight on each leg: Hold on with one hand the first time, then progress to no hand supports. Move cups from one side of sink to the other. Place cups ~2" out of your reach, progress to 10" beyond reach.  Place one hand in middle of sink and reach with other hand. Do both arms.  Then hover one hand and move cups with other hand.  Overhead/Upward Reaching: alternated reaching up to top cabinets or ceiling if no cabinets present. Keep equal weight on each leg. Start with one hand support on counter while other hand reaches and progress to no hand  support with reaching.  ace one hand in middle of sink and reach with other hand. Do both arms.   Looking Over Shoulders: With equal weight on each leg: alternate turning to look over your shoulders with one hand  support on counter as needed.  Start with head motions only to look in front of shoulder, then even with shoulder and progress to looking behind you. To look to side, move head /eyes, then shoulder on side looking pulls back, shift more weight to side looking and pull hip back. Place one hand in middle of sink and let go with other hand so your shoulder can pull back. Switch hands to look other way. If looking right, use left hand at sink. If looking left, use right hand at sink. 6.  Stepping with leg tapping forefoot into lower cabinet:  Move items under cabinet out of your way. Shift your hips/pelvis so weight on right side. Tighten muscles in hip on right side.  SLOWLY step left leg so front of foot is in cabinet. Then step back to floor.  Repeat with other leg    ASSESSMENT:   CLINICAL IMPRESSION: He reported increased Lt knee/limb pain with ramp ascent/descent and bunched sock within socket. He needed repeated demo of safe ascent and descent with RW on the ramp and will benefit from reinforced practice of ramp. Following discussion of home set up and theater, pt practiced side stepping with BUE support with SBA and appears safe to side step to seats with UE support. He ascended and descended 4 steps with cane and handrail with cueing for sequence and would benefit from practicing side stepping technique to improve confidence and stability with both handrail options. He continues to benefit from skilled PT.   OBJECTIVE IMPAIRMENTS Abnormal gait, decreased activity tolerance, decreased balance, decreased coordination, decreased endurance, decreased knowledge of use of DME, decreased mobility, difficulty walking, decreased ROM, decreased strength, decreased safety awareness, increased edema,  impaired flexibility, postural dysfunction, prosthetic dependency , and pain.    ACTIVITY LIMITATIONS bending, sitting, standing, squatting, stairs, transfers, bathing, dressing, hygiene/grooming, locomotion level, and prosthetic use   PARTICIPATION LIMITATIONS: cleaning, laundry, and community activity   PERSONAL FACTORS Fitness, Time since onset of injury/illness/exacerbation, and 3+ comorbidities: see PMH  are also affecting patient's functional outcome.    REHAB POTENTIAL: Good   CLINICAL DECISION MAKING: Evolving/moderate complexity   EVALUATION COMPLEXITY: Moderate     GOALS: Goals reviewed with patient? Yes   SHORT TERM GOALS: Target date: 12/14/21   Patient independent with donning/doffing prosthesis. Baseline: see objective data Goal status: ongoing 12/11/21   2.  Patient verbalize proper assessment of wound with prosthetic use.  Baseline: see objective data Goal status: met 12/11/21   3.  Patient wears prosthesis >/= 8 hours/day daily without increase in wound & limb pain </= 5/10 to facilitate functional mobility and safety with transfers. Baseline: see objective data Goal status: met 12/05/21   4.  Patient able to transfer sit to/from stand chair with armrests with RW with supervision. Baseline: see objective data Goal status: met 12/05/21   5.  Patient ambulates 60' with RW & prosthesis / AFO with minA including negotiating around obstacles.  Baseline: see objective data Goal status: met 12/11/21 with SBA     LONG TERM GOALS: Target date: 02/07/22   Patient wears prosthesis > 90% of waking hours with no limb pain or skin issues to promote function and safety. Baseline: see objective data Goal status: INITIAL   2.  Patient independent with all aspects of prosthetic care to enable safe utilization of prosthesis. Baseline: see objective data Goal status: INITIAL   3.  Patient ambulates 66' with LRAD safely and modified independent to allow for household  mobility.  Baseline: see objective data Goal status: INITIAL   4.  Patient able to ambulate 200' and navigate ramp and curb with LRAD safely with minA/ caregiver assist to allow for community mobility. Baseline: see objective data Goal status: INITIAL   5.  Berg balance score >/= 30/56 with RW to indicate low fall risk during household level balance requirements with UE support. Baseline: see objective data Goal status: INITIAL     PLAN: PT FREQUENCY: 2x/week   PT DURATION: other: 90 days   PLANNED INTERVENTIONS: Therapeutic exercises, Therapeutic activity, Neuromuscular re-education, Balance training, Gait training, Patient/Family education, Self Care, Stair training, Vestibular training, Orthotic/Fit training, Prosthetic training, DME instructions, Moist heat, scar mobilization, Manual therapy, Re-evaluation, and physical performance tests   PLAN FOR NEXT SESSION:  re-assess remaining STG and write new STG for 10/6, reinforce ramp with RW, instruct in side stepping technique at stairs  Jana Hakim, Student-PT 12/11/2021, 10:41 AM  This entire session of physical therapy was performed under the direct supervision of PT signing evaluation /treatment. PT reviewed note and agrees.  Jamey Reas, PT, DPT 12/11/2021, 12:25 PM

## 2021-12-13 ENCOUNTER — Encounter: Payer: Self-pay | Admitting: Physical Therapy

## 2021-12-13 ENCOUNTER — Ambulatory Visit (INDEPENDENT_AMBULATORY_CARE_PROVIDER_SITE_OTHER): Payer: Medicare Other | Admitting: Physical Therapy

## 2021-12-13 DIAGNOSIS — R293 Abnormal posture: Secondary | ICD-10-CM | POA: Diagnosis not present

## 2021-12-13 DIAGNOSIS — R2689 Other abnormalities of gait and mobility: Secondary | ICD-10-CM | POA: Diagnosis not present

## 2021-12-13 DIAGNOSIS — R2681 Unsteadiness on feet: Secondary | ICD-10-CM

## 2021-12-13 DIAGNOSIS — M6281 Muscle weakness (generalized): Secondary | ICD-10-CM

## 2021-12-13 DIAGNOSIS — M79605 Pain in left leg: Secondary | ICD-10-CM

## 2021-12-13 NOTE — Therapy (Signed)
OUTPATIENT PHYSICAL THERAPY TREATMENT NOTE   Patient Name: [name] MRN: [id number] DOB:[birth year], 71 y.o., male Today's Date: 12/13/2021  PCP: Lorenda Hatchet, FNP REFERRING PROVIDER: [name], MD  END OF SESSION:   PT End of Session - 12/13/21 0846     Visit Number 9    Number of Visits 25    Date for PT Re-Evaluation 02/07/22    Authorization Type Medicare and BCBS    Progress Note Due on Visit 10    PT Start Time 0845    PT Stop Time 0930    PT Time Calculation (min) 45 min    Equipment Utilized During Treatment Gait belt    Activity Tolerance Patient limited by fatigue;Patient limited by pain    Behavior During Therapy [facility] for tasks assessed/performed             Past Medical History:  Diagnosis Date   Ambulates with cane    straight cane   Anemia    Cervical myelopathy ([place]) 02/06/2018   Chronic kidney disease    dailysis M W F- home   Complication of anesthesia    Coronary artery disease    Diabetes mellitus without complication ([place])    type2   Diabetic foot ulcer ([place])    Diabetic neuropathy ([place]) 02/06/2018   Gait abnormality 08/24/2018   GERD (gastroesophageal reflux disease)     01/06/20- not current   Gout    History of blood transfusion    History of blood transfusion    History of kidney stones    passed stones   Hypercholesteremia    Hypertension    Hypothyroidism    Neuromuscular disorder ([place])    neuropathy left leg and bilateral feet   Neuropathy    PONV (postoperative nausea and vomiting)    Prostate cancer ([employer])    PVD (peripheral vascular disease) ([place])    with amputations   Past Surgical History:  Procedure Laterality Date   A-FLUTTER ABLATION N/A 04/06/2020   Procedure: A-FLUTTER ABLATION;  Surgeon: [name], MD;  Location: [facility];  Service: Cardiovascular;  Laterality: N/A;   AMPUTATION Left 12/25/2013   Procedure: AMPUTATION RAY LEFT 5TH RAY;  Surgeon: [name], MD;  Location: [facility];   Service: Orthopedics;  Laterality: Left;   AMPUTATION Right 12/15/2018   Procedure: AMPUTATION OF 4TH AND 5TH TOES RIGHT FOOT;  Surgeon: [name], MD;  Location: [place];  Service: Orthopedics;  Laterality: Right;   AMPUTATION Left 02/02/2021   Procedure: LEFT FOOT 4TH RAY AMPUTATION;  Surgeon: [name], MD;  Location: [place];  Service: Orthopedics;  Laterality: Left;   AMPUTATION Left 05/09/2021   Procedure: LEFT BELOW KNEE AMPUTATION;  Surgeon: [name], MD;  Location: [place];  Service: Orthopedics;  Laterality: Left;   APPLICATION OF WOUND VAC Right 12/15/2018   Procedure: APPLICATION OF WOUND VAC;  Surgeon: [name], MD;  Location: [place];  Service: Orthopedics;  Laterality: Right;   AV FISTULA PLACEMENT Left 03/25/2019   Procedure: LEFT ARM ARTERIOVENOUS (AV) FISTULA CREATION;  Surgeon: [name], MD;  Location: [place];  Service: Vascular;  Laterality: Left;   [place] Left 05/20/2019   Procedure: SECOND STAGE LEFT BASCILIC VEIN TRANSPOSITION;  Surgeon: [name], MD;  Location: [place];  Service: Vascular;  Laterality: Left;   CARDIAC CATHETERIZATION  02/17/2014   CHOLECYSTECTOMY  COLONOSCOPY  2011   in [place], Normal   CORONARY ARTERY BYPASS GRAFT  2008   CYSTOSCOPY N/A 06/29/2020   Procedure: CYSTOSCOPY WITH CLOT EVACUATION AND  FULGERATION;  Surgeon: [name], MD;  Location: [place];  Service: Urology;  Laterality: N/A;   CYSTOSCOPY WITH FULGERATION Bilateral 06/17/2020   Procedure: CYSTOSCOPY,BILATERAL RETROGRADE, CLOT EVACUATION WITH FULGERATION OF THE BLADDER;  Surgeon: Janith Lima, MD;  Location: [place];  Service: Urology;  Laterality: Bilateral;   CYSTOSCOPY WITH FULGERATION N/A 06/28/2020   Procedure: CYSTOSCOPY WITH CLOT EVACUATION AND FULGERATION OF BLEEDERS;  Surgeon: [name], MD;  Location: [place];  Service: Urology;  Laterality: N/A;  1 HR   I & D EXTREMITY Right 12/15/2018   Procedure:  DEBRIDEMENT RIGHT FOOT;  Surgeon: [name], MD;  Location: [place];  Service: Orthopedics;  Laterality: Right;   I & D EXTREMITY Right 08/27/2019   Procedure: PARTIAL CUBOID EXCISION RIGHT FOOT;  Surgeon: [name], MD;  Location: [place];  Service: Orthopedics;  Laterality: Right;   I & D EXTREMITY Right 01/07/2020   Procedure: RIGHT FOOT EXCISION INFECTED BONE;  Surgeon: [name], MD;  Location: [place];  Service: Orthopedics;  Laterality: Right;   NECK SURGERY     novemver 2019   STUMP REVISION Left 06/27/2021   Procedure: REVISION LEFT BELOW KNEE AMPUTATION;  Surgeon: [name], MD;  Location: [place];  Service: Orthopedics;  Laterality: Left;   TRANSURETHRAL RESECTION OF PROSTATE N/A 06/28/2020   Procedure: TRANSURETHRAL RESECTION OF THE PROSTATE (TURP);  Surgeon: [name], MD;  Location: [place];  Service: Urology;  Laterality: N/A;   WISDOM TOOTH EXTRACTION     Patient Active Problem List   Diagnosis Date Noted   Community acquired pneumonia of left lower lobe of lung 08/18/2021   Paroxysmal atrial fibrillation ([place]) 08/18/2021   Anemia of chronic renal failure 08/18/2021   Thrombocytopenia ([place]) 44/06/4740   Acute metabolic encephalopathy 59/56/3875   Action induced myoclonus 08/18/2021   Dehiscence of amputation stump of left lower extremity ([place])    Left below-knee amputee ([name]) 05/11/2021   S/P BKA (below knee amputation) unilateral, left ([place]) 05/09/2021   Partial nontraumatic amputation of foot, left ([place]) 02/20/2021   Abscess of bursa of left foot 02/03/2021   Cutaneous abscess of left foot    Acute osteomyelitis of metatarsal bone of left foot ([facility])    Symptomatic anemia 09/13/2020   COVID-19 virus infection 09/13/2020   Pressure injury of skin 06/29/2020   Gross hematuria 06/16/2020   Typical atrial flutter ([place]) 03/24/2020   Bilateral pleural effusion 02/24/2020   Healthcare maintenance 01/28/2020   Shortness of breath 01/28/2020   History of  partial ray amputation of fourth toe of right foot ([place]) 09/08/2019   Ulcerated, foot, right, with necrosis of bone ([facility])    Chronic cough 06/25/2019   Cutaneous abscess of right foot    Subacute osteomyelitis of right foot ([place]) 12/14/2018   AKI (acute kidney injury) ([place]) 12/14/2018   ESRD on hemodialysis ([place]) 12/14/2018   Gait abnormality 08/24/2018   Diabetic neuropathy ([place]) 02/06/2018   Cervical myelopathy ([place]) 02/06/2018   Onychomycosis 10/30/2017   Other spondylosis with radiculopathy, cervical region 01/27/2017   Midfoot ulcer, right, limited to breakdown of skin ([place]) 11/15/2016   Lateral epicondylitis, left elbow 08/12/2016   Cellulitis of fifth toe of right foot 07/29/2016   Prostate cancer ([place]) 06/19/2016   Non-pressure chronic ulcer of other part  of right foot limited to breakdown of skin ([place]) 04/04/2016   Cellulitis of leg, right 12/24/2015   Cellulitis of right lower extremity 12/24/2015   Gout 09/14/2012   Hypertension associated with diabetes ([place]) 09/14/2012   Hypothyroidism 09/14/2012   CAD (coronary artery disease) 09/14/2012   Insulin dependent type 2 diabetes mellitus ([place]) 09/14/2012   Hyperlipidemia associated with type 2 diabetes mellitus ([place])    Neuropathy ([place])     REFERRING DIAG: J49.702O (ICD-10-CM) - Below-knee amputation of left lower extremity  THERAPY DIAG:  Unsteadiness on feet  Other abnormalities of gait and mobility  Abnormal posture  Muscle weakness (generalized)  Pain in left leg  Rationale for Evaluation and Treatment Rehabilitation  PERTINENT HISTORY: braced right foot with cavovarus collapse, CAP without hypoxia, acute metabolic encephalopathy, ESRD on HD Sun/W/F, afib s/p ablation, DM II, neuropathy, HTN, HLD, hypothyroidism, anemia, prostate cancer s/p TUPP. cervical myelopathy, gout, CAD w/CABG 2005  PRECAUTIONS: no BP in LUE, fall risk   SUBJECTIVE: He reports new limb pain today that isn't the burning pain he has  reported before. He wears the prosthesis all day with one break in the middle of the day and isn't taking it off for sweat management currently.  PAIN:  Are you having pain?  No   OBJECTIVE: (objective measures completed at initial evaluation unless otherwise dated) COGNITION: Overall cognitive status: Within functional limits for tasks assessed              SENSATION: Not tested   POSTURE: rounded shoulders, forward head, and anterior pelvic tilt - RLE resting position with tibial IR, severe foot supination and inversion with cavovarus collapse   LOWER EXTREMITY ROM:   ROM (A: AROM, P: PROM) Right eval Left eval  Hip flexion      Hip extension      Hip abduction      Hip adduction      Hip internal rotation      Hip external rotation      Knee flexion      Knee extension   Standing with RW and prosthesis  A: -16*  Ankle dorsiflexion      Ankle plantarflexion      Ankle inversion      Ankle eversion       (Blank rows = not tested)   LOWER EXTREMITY MMT:   MMT Right eval Left eval  Hip flexion      Hip extension      Hip abduction      Hip adduction      Hip internal rotation      Hip external rotation      Knee flexion      Knee extension      Ankle dorsiflexion      Ankle plantarflexion      Ankle inversion      Ankle eversion      (Blank rows = not tested)   Functional strength with standing and transfers 3- at hip and knee bil., with no motion at Rt ankle   TRANSFERS: Sit to stand: minA - fell backwards into chair upon first attempt, MinA for next attempt with RW stabilization and verbal cueing for foot placement Stand to sit: minA for controlling descent and stabilization   GAIT: Gait pattern:  adduction RLE with toe-in, step to pattern, decreased step length- Right, decreased stance time- Right, decreased stride length, decreased hip/knee flexion- Left, decreased ankle dorsiflexion- Right, Left hip hike, Right foot flat, knee flexed  in stance- Right,  knee flexed in stance- Left, lateral hip instability, lateral lean- Right, trunk flexed, and poor foot clearance- Right Distance walked: 35' Assistive device utilized: Environmental consultant - 2 wheeled & Left TTA prosthesis, right double upright AFO custom shoe Level of assistance: MinA to recover losses of balance with straight surface walking, ModA to recover losses of balance turning Lt, MaxA with loss of balance during turning Rt Comments: Rt lower leg positioning reduced foot clearance with catching Lt heel most steps, and promoted Rt lateral leaning/lateral loss of balance. Requires excessive BUE support on RW.   FUNCTIONAL TESTs:  Berg Balance Scale: 6/56, 13/56 with RW   [facility] PT Assessment - 11/13/21 0001                Standardized Balance Assessment    Standardized Balance Assessment Berg Balance Test          Berg Balance Test    Sit to Stand Needs moderate or maximal assist to stand   Berg task with RW = 1    Standing Unsupported Unable to stand 30 seconds unassisted   Berg task with RW = 2    Sitting with Back Unsupported but Feet Supported on Floor or Stool Able to sit safely and securely 2 minutes     Stand to Sit Needs assistance to sit   Berg task with RW = 2    Transfers Able to transfer with verbal cueing and /or supervision     Standing Unsupported with Eyes Closed Needs help to keep from falling   Berg task with RW = 0    Standing Unsupported with Feet Together Needs help to attain position and unable to hold for 15 seconds   Berg task with RW = 0    From Standing, Reach Forward with Outstretched Arm Loses balance while trying/requires external support   Berg task with RW = 1 (CGA reaching with Lt)    From Standing Position, Pick up Object from Floor Unable to try/needs assist to keep balance   Berg task with RW = 0    From Standing Position, Turn to Look Behind Over each Shoulder Needs assist to keep from losing balance and falling   Berg task with RW = 0    Turn 360 Degrees Needs  assistance while turning   Berg task with RW = 0    Standing Unsupported, Alternately Place Feet on Step/Stool Needs assistance to keep from falling or unable to try   Berg task with RW = 0    Standing Unsupported, One Foot in [employer] balance while stepping or standing   Berg task with RW = 0    Standing on One Leg Unable to try or needs assist to prevent fall   Berg task with RW = 1    Total Score 6     Berg comment: Berg tasks with RW = 13/56                     CURRENT PROSTHETIC WEAR ASSESSMENT: Patient is dependent with: skin check, residual limb care, care of non-amputated limb, prosthetic cleaning, ply sock cleaning, correct ply sock adjustment, proper wear schedule/adjustment, and proper weight-bearing schedule/adjustment Donning prosthesis: Min A and maximal cueing Doffing prosthesis: SBA Prosthetic wear tolerance: 30 minutes 1-2x/day 6 of 6 days since delivery Prosthetic weight bearing tolerance: stood ~5 minutes with c/o tibial crest pain moderate level after weight bearing Edema: present with slow capillary refill Residual limb condition:  1.6 cm scab along incision, cylinderical shape, normal color & temperature, mildly dry skin, slight scar adhesion at distal tibia,  Prosthetic description: silicone liner with pin lock suspension, 2nd pelite foam liner, total contact socket, SACH foot     TODAY'S TREATMENT:  12/13/2021 Prosthetic Training with left Transtibial Prosthesis & right double upright AFO: PT education on sock ply adjustments, he can have difficulty with this due to color blindness Sit to/from stand from w/c with armrests and chair without armrests to RW with SBA, demonstrated decreased control of descent to chair without armrests Pt amb 100' with RW and visual cues for step placement weaving around tall obstacles with SBA. Then another 100 feet with obstacles to navigate around.  Pt amb 6' to and from counter with RW to side step 8' Rt and Lt x 3 ea. with  SBA/CGA following PT demo of weight shift and pushing to step, pt required cueing for upright posture PT demo & verbal cues on ascend and descend stairs with one handrail and with side stepping technique for 5 steps. He needed cues to allow room for trail leg to descend and place down on step.  Leg press machine DL 75# X 20, then SL 25# X 20 bilat Pt amb one more round of 75 feet with RW and SBA, with some carryover of wider step length to visual targets on RW.  12/11/2021 Prosthetic Training with left Transtibial Prosthesis & right double upright AFO: PT education on sock bunching between liner and socket, pt verbalized understanding and returned demo of proper donning PT education on oil application only on spot of friction prior to donning liner, pt verbalized understanding PT suggestion to add regular sock to prosthetic foot inside shoe, pt verbalized understanding PT education on timeline for socket revision, K level indications for prosthesis elements, potential for swimming and risk of wound development and pt and wife verbalized understanding Sit to/from stand from w/c with armrests and chair without armrests to RW with SBA, demonstrated decreased control of descent to chair without armrests Pt amb 100' with RW and visual cues for step placement weaving around tall obstacles with SBA Pt ascend and descend ramp with RW and wife and PT guarding with minA to control RW, pt reports high increase in Lt limb/knee pain. PT explain and demo ramp with RW and prosthesis with cues for hand and walker placement, foot placement, weight distrubution; pt and wife verbalize understanding Pt amb 6' to and from counter with RW to side step 8' Rt and Lt x 2 ea. with SBA/CGA following PT demo of weight shift and pushing to step, pt required cueing for upright posture Pt amb 25' with RW and visual cues for step placement with SBA PT demo & verbal cues on ascend and descend stairs with small base quad cane and  handrail and with side stepping technique. Pt descend and ascend 2 steps with cane and Rt handrail and 2 steps with cane and Lt handrail ea. with minA (2nd person for safety) and verbal and tactile cueing for sequence  12/05/2021 Prosthetic Training with left Transtibial Prosthesis & right double upright AFO: PT recommend increasing wear to all day with one 2 hour break in the middle of his waking hours with switching liners upon the middle of the day and drying limb/liner midway of morning & afternoon wear, at least daily upon convenient time. Pt & wife verbalized understanding.  Pt sit to stand from w/c with RW SBA, stand to sit RW to w/c  with cueing for bow and posterior reach Pt amb 150' with RW and visual cues for step placement and SBA before reporting fatigue with PT demo single person guarding and w/c follow. Goal is to walk max tolerable distance 1-2x/day which should increase over time.  Pt ascend 6" curb with LLE and RW and needed assistance recovering posterior loss of balance, descend RLE CGA Pt ascend and descend 8.75" curb with RW and sequence leading with RLE & down with LLE with PT CGA and x 1 with wife CGA Pt stand pivot transfer with RW, SBA and no cueing for form Pt reports high fatigue after today's session following gym attendance this morning and long walk in clinic    [place]:   11/22/21 1244  PT Education  Education Details HEP at sink  Person(s) Educated Patient;Spouse  Methods Explanation;Demonstration;Tactile cues;Verbal cues;Handout  Comprehension Verbalized understanding;Returned demonstration;Verbal cues required;Tactile cues required;Need further instruction    Do each exercise 1-2  times per day Do each exercise 5-10 repetitions Hold each exercise for 2 seconds to feel your location  AT [place].  Try to find this position when standing still for activities.   USE TAPE ON  FLOOR TO MARK THE MIDLINE POSITION which is even with middle of sink.  You also should try to feel with your limb pressure in socket.  You are trying to feel with limb what you used to feel with the bottom of your foot.  Side to Side Shift: Moving your hips only (not shoulders): move weight onto your left leg, HOLD/FEEL pressure in socket.  Move back to equal weight on each leg, HOLD/FEEL pressure in socket. Move weight onto your right leg, HOLD/FEEL pressure in socket. Move back to equal weight on each leg, HOLD/FEEL pressure in socket. Repeat.  Start with both hands on sink, then right hand only, then left hand only Front to Back Shift: Moving your hips only (not shoulders): move your weight forward onto your toes, HOLD/FEEL pressure in socket. Move your weight back to equal Flat Foot on both legs, HOLD/FEEL  pressure in socket. Move your weight back onto your heels, HOLD/FEEL  pressure in socket. Move your weight back to equal on both legs, HOLD/FEEL  pressure in socket. Repeat.  Start with both hands on sink, then right hand only, then left hand only  Moving Cones / Cups: With equal weight on each leg: Hold on with one hand the first time, then progress to no hand supports. Move cups from one side of sink to the other. Place cups ~2" out of your reach, progress to 10" beyond reach.  Place one hand in middle of sink and reach with other hand. Do both arms.  Then hover one hand and move cups with other hand.  Overhead/Upward Reaching: alternated reaching up to top cabinets or ceiling if no cabinets present. Keep equal weight on each leg. Start with one hand support on counter while other hand reaches and progress to no hand support with reaching.  ace one hand in middle of sink and reach with other hand. Do both arms.   Looking Over Shoulders: With equal weight on each leg: alternate turning to look over your shoulders with one hand support on counter as needed.  Start with head motions only to look in  front of shoulder, then even with shoulder and progress to looking behind you. To look to side, move head /eyes, then  shoulder on side looking pulls back, shift more weight to side looking and pull hip back. Place one hand in middle of sink and let go with other hand so your shoulder can pull back. Switch hands to look other way. If looking right, use left hand at sink. If looking left, use right hand at sink. 6.  Stepping with leg tapping forefoot into lower cabinet:  Move items under cabinet out of your way. Shift your hips/pelvis so weight on right side. Tighten muscles in hip on right side.  SLOWLY step left leg so front of foot is in cabinet. Then step back to floor.  Repeat with other leg    ASSESSMENT:   CLINICAL IMPRESSION: He still has some knee pain so ramp was held today. Some of knee pain could possibly be due to proper sock ply adjustments. Even with some knee pain he still had good overall tolerance to prosthetic gait training and strengthening program. PT recommending to continue current plan of care.    OBJECTIVE IMPAIRMENTS Abnormal gait, decreased activity tolerance, decreased balance, decreased coordination, decreased endurance, decreased knowledge of use of DME, decreased mobility, difficulty walking, decreased ROM, decreased strength, decreased safety awareness, increased edema, impaired flexibility, postural dysfunction, prosthetic dependency , and pain.    ACTIVITY LIMITATIONS bending, sitting, standing, squatting, stairs, transfers, bathing, dressing, hygiene/grooming, locomotion level, and prosthetic use   PARTICIPATION LIMITATIONS: cleaning, laundry, and community activity   PERSONAL FACTORS Fitness, Time since onset of injury/illness/exacerbation, and 3+ comorbidities: see PMH  are also affecting patient's functional outcome.    REHAB POTENTIAL: Good   CLINICAL DECISION MAKING: Evolving/moderate complexity   EVALUATION COMPLEXITY: Moderate     GOALS: Goals reviewed  with patient? Yes   SHORT TERM GOALS: Target date: 12/14/21   Patient independent with donning/doffing prosthesis. Baseline: see objective data Goal status: ongoing 12/11/21   2.  Patient verbalize proper assessment of wound with prosthetic use.  Baseline: see objective data Goal status: met 12/11/21   3.  Patient wears prosthesis >/= 8 hours/day daily without increase in wound & limb pain </= 5/10 to facilitate functional mobility and safety with transfers. Baseline: see objective data Goal status: met 12/05/21   4.  Patient able to transfer sit to/from stand chair with armrests with RW with supervision. Baseline: see objective data Goal status: met 12/05/21   5.  Patient ambulates 56' with RW & prosthesis / AFO with minA including negotiating around obstacles.  Baseline: see objective data Goal status: met 12/11/21 with SBA     LONG TERM GOALS: Target date: 02/07/22   Patient wears prosthesis > 90% of waking hours with no limb pain or skin issues to promote function and safety. Baseline: see objective data Goal status: INITIAL   2.  Patient independent with all aspects of prosthetic care to enable safe utilization of prosthesis. Baseline: see objective data Goal status: INITIAL   3.  Patient ambulates 3' with LRAD safely and modified independent to allow for household mobility. Baseline: see objective data Goal status: INITIAL   4.  Patient able to ambulate 200' and navigate ramp and curb with LRAD safely with minA/ caregiver assist to allow for community mobility. Baseline: see objective data Goal status: INITIAL   5.  Berg balance score >/= 30/56 with RW to indicate low fall risk during household level balance requirements with UE support. Baseline: see objective data Goal status: INITIAL     PLAN: PT FREQUENCY: 2x/week   PT DURATION: other: 90 days  PLANNED INTERVENTIONS: Therapeutic exercises, Therapeutic activity, Neuromuscular re-education, Balance training, Gait  training, Patient/Family education, Self Care, Stair training, Vestibular training, Orthotic/Fit training, Prosthetic training, DME instructions, Moist heat, scar mobilization, Manual therapy, Re-evaluation, and physical performance tests   PLAN FOR NEXT SESSION: needs progress note and re-assess remaining STG and write new STG for 10/6, reinforce ramp with RW, instruct in side stepping technique at stairs  Debbe Odea, PT,DPT 12/13/2021, 8:46 AM

## 2021-12-15 ENCOUNTER — Other Ambulatory Visit: Payer: Self-pay | Admitting: Cardiology

## 2021-12-15 DIAGNOSIS — I2510 Atherosclerotic heart disease of native coronary artery without angina pectoris: Secondary | ICD-10-CM

## 2021-12-15 DIAGNOSIS — I10 Essential (primary) hypertension: Secondary | ICD-10-CM

## 2021-12-17 ENCOUNTER — Ambulatory Visit (INDEPENDENT_AMBULATORY_CARE_PROVIDER_SITE_OTHER): Payer: Medicare Other | Admitting: Physical Therapy

## 2021-12-17 ENCOUNTER — Encounter: Payer: Self-pay | Admitting: Physical Therapy

## 2021-12-17 DIAGNOSIS — R2689 Other abnormalities of gait and mobility: Secondary | ICD-10-CM

## 2021-12-17 DIAGNOSIS — R293 Abnormal posture: Secondary | ICD-10-CM | POA: Diagnosis not present

## 2021-12-17 DIAGNOSIS — M6281 Muscle weakness (generalized): Secondary | ICD-10-CM | POA: Diagnosis not present

## 2021-12-17 DIAGNOSIS — L98498 Non-pressure chronic ulcer of skin of other sites with other specified severity: Secondary | ICD-10-CM

## 2021-12-17 DIAGNOSIS — R2681 Unsteadiness on feet: Secondary | ICD-10-CM | POA: Diagnosis not present

## 2021-12-17 DIAGNOSIS — M79605 Pain in left leg: Secondary | ICD-10-CM

## 2021-12-17 DIAGNOSIS — M25662 Stiffness of left knee, not elsewhere classified: Secondary | ICD-10-CM

## 2021-12-17 NOTE — Therapy (Signed)
OUTPATIENT PHYSICAL THERAPY TREATMENT NOTE & 10TH VISIT PROGRESS NOTE   Patient Name: [name] MRN: [id number] DOB:11-May-1950, 71 y.o., male Today's Date: 12/17/2021  PCP: Lorenda Hatchet, FNP REFERRING PROVIDER: [name], MD  Progress Note Reporting Period 11/13/21 to 12/17/21  See note below for Objective Data and Assessment of Progress/Goals.    END OF SESSION:   PT End of Session - 12/17/21 1149     Visit Number 10    Number of Visits 25    Date for PT Re-Evaluation 02/07/22    Authorization Type Medicare and BCBS    Progress Note Due on Visit 29    PT Start Time 1147    PT Stop Time 6433    PT Time Calculation (min) 48 min    Equipment Utilized During Treatment Gait belt    Activity Tolerance Patient limited by fatigue;Patient limited by pain    Behavior During Therapy WFL for tasks assessed/performed             Past Medical History:  Diagnosis Date   Ambulates with cane    straight cane   Anemia    Cervical myelopathy ([place]) 02/06/2018   Chronic kidney disease    dailysis M W F- home   Complication of anesthesia    Coronary artery disease    Diabetes mellitus without complication ([place])    type2   Diabetic foot ulcer ([place])    Diabetic neuropathy ([place]) 02/06/2018   Gait abnormality 08/24/2018   GERD (gastroesophageal reflux disease)     01/06/20- not current   Gout    History of blood transfusion    History of blood transfusion    History of kidney stones    passed stones   Hypercholesteremia    Hypertension    Hypothyroidism    Neuromuscular disorder (HCC)    neuropathy left leg and bilateral feet   Neuropathy    PONV (postoperative nausea and vomiting)    Prostate cancer ([place])    PVD (peripheral vascular disease) ([place])    with amputations   Past Surgical History:  Procedure Laterality Date   A-FLUTTER ABLATION N/A 04/06/2020   Procedure: A-FLUTTER ABLATION;  Surgeon: [name], MD;  Location: [facility];  Service:  Cardiovascular;  Laterality: N/A;   AMPUTATION Left 12/25/2013   Procedure: AMPUTATION RAY LEFT 5TH RAY;  Surgeon: [name], MD;  Location: [facility];  Service: Orthopedics;  Laterality: Left;   AMPUTATION Right 12/15/2018   Procedure: AMPUTATION OF 4TH AND 5TH TOES RIGHT FOOT;  Surgeon: [name], MD;  Location: [facility];  Service: Orthopedics;  Laterality: Right;   AMPUTATION Left 02/02/2021   Procedure: LEFT FOOT 4TH RAY AMPUTATION;  Surgeon: [name], MD;  Location: [facility];  Service: Orthopedics;  Laterality: Left;   AMPUTATION Left 05/09/2021   Procedure: LEFT BELOW KNEE AMPUTATION;  Surgeon: [name], MD;  Location: [facility];  Service: Orthopedics;  Laterality: Left;   APPLICATION OF WOUND VAC Right 12/15/2018   Procedure: APPLICATION OF WOUND VAC;  Surgeon: [name], MD;  Location: [name];  Service: Orthopedics;  Laterality: Right;   AV FISTULA PLACEMENT Left 03/25/2019   Procedure: LEFT ARM ARTERIOVENOUS (AV) FISTULA CREATION;  Surgeon: [name], MD;  Location: [facility];  Service: Vascular;  Laterality: Left;   [place] Left 05/20/2019   Procedure: SECOND STAGE LEFT BASCILIC VEIN TRANSPOSITION;  Surgeon: Trula Slade,  Butch Penny, MD;  Location: [facility];  Service: Vascular;  Laterality: Left;   CARDIAC CATHETERIZATION  02/17/2014   CHOLECYSTECTOMY     COLONOSCOPY  2011   in [place], [place] GRAFT  2008   CYSTOSCOPY N/A 06/29/2020   Procedure: CYSTOSCOPY WITH CLOT EVACUATION AND  FULGERATION;  Surgeon: [name], MD;  Location: [facility];  Service: Urology;  Laterality: N/A;   CYSTOSCOPY WITH FULGERATION Bilateral 06/17/2020   Procedure: CYSTOSCOPY,BILATERAL RETROGRADE, CLOT EVACUATION WITH FULGERATION OF THE BLADDER;  Surgeon: Janith Lima, MD;  Location: [facility];  Service: Urology;  Laterality: Bilateral;   CYSTOSCOPY WITH FULGERATION N/A 06/28/2020   Procedure: CYSTOSCOPY WITH CLOT EVACUATION AND FULGERATION OF  BLEEDERS;  Surgeon: [name], MD;  Location: [facility];  Service: Urology;  Laterality: N/A;  1 HR   I & D EXTREMITY Right 12/15/2018   Procedure: DEBRIDEMENT RIGHT FOOT;  Surgeon: [name], MD;  Location: [facility];  Service: Orthopedics;  Laterality: Right;   I & D EXTREMITY Right 08/27/2019   Procedure: PARTIAL CUBOID EXCISION RIGHT FOOT;  Surgeon: [name], MD;  Location: [facility];  Service: Orthopedics;  Laterality: Right;   I & D EXTREMITY Right 01/07/2020   Procedure: RIGHT FOOT EXCISION INFECTED BONE;  Surgeon: [name], MD;  Location: [facility];  Service: Orthopedics;  Laterality: Right;   NECK SURGERY     novemver 2019   STUMP REVISION Left 06/27/2021   Procedure: REVISION LEFT BELOW KNEE AMPUTATION;  Surgeon: [name], MD;  Location: [facility];  Service: Orthopedics;  Laterality: Left;   TRANSURETHRAL RESECTION OF PROSTATE N/A 06/28/2020   Procedure: TRANSURETHRAL RESECTION OF THE PROSTATE (TURP);  Surgeon: [name], MD;  Location: [facility];  Service: Urology;  Laterality: N/A;   WISDOM TOOTH EXTRACTION     Patient Active Problem List   Diagnosis Date Noted   Community acquired pneumonia of left lower lobe of lung 08/18/2021   Paroxysmal atrial fibrillation ([place]) 08/18/2021   Anemia of chronic renal failure 08/18/2021   Thrombocytopenia ([place]) 27/51/7001   Acute metabolic encephalopathy 74/94/4967   Action induced myoclonus 08/18/2021   Dehiscence of amputation stump of left lower extremity ([place])    Left below-knee amputee ([place]) 05/11/2021   S/P BKA (below knee amputation) unilateral, left ([place]) 05/09/2021   Partial nontraumatic amputation of foot, left ([place]) 02/20/2021   Abscess of bursa of left foot 02/03/2021   Cutaneous abscess of left foot    Acute osteomyelitis of metatarsal bone of left foot (HCC)    Symptomatic anemia 09/13/2020   COVID-19 virus infection 09/13/2020   Pressure injury of skin 06/29/2020   Gross hematuria 06/16/2020   Typical atrial  flutter ([place]) 03/24/2020   Bilateral pleural effusion 02/24/2020   Healthcare maintenance 01/28/2020   Shortness of breath 01/28/2020   History of partial ray amputation of fourth toe of right foot ([place]) 09/08/2019   Ulcerated, foot, right, with necrosis of bone (HCC)    Chronic cough 06/25/2019   Cutaneous abscess of right foot    Subacute osteomyelitis of right foot ([place]) 12/14/2018   AKI (acute kidney injury) ([place]) 12/14/2018   ESRD on hemodialysis ([place]) 12/14/2018   Gait abnormality 08/24/2018   Diabetic neuropathy ([place]) 02/06/2018   Cervical myelopathy ([place]) 02/06/2018   Onychomycosis 10/30/2017   Other spondylosis with radiculopathy, cervical region 01/27/2017   Midfoot ulcer, right, limited to breakdown of skin ([place]) 11/15/2016   Lateral epicondylitis, left  elbow 08/12/2016   Cellulitis of fifth toe of right foot 07/29/2016   Prostate cancer ([place]) 06/19/2016   Non-pressure chronic ulcer of other part of right foot limited to breakdown of skin ([place]) 04/04/2016   Cellulitis of leg, right 12/24/2015   Cellulitis of right lower extremity 12/24/2015   Gout 09/14/2012   Hypertension associated with diabetes ([place]) 09/14/2012   Hypothyroidism 09/14/2012   CAD (coronary artery disease) 09/14/2012   Insulin dependent type 2 diabetes mellitus ([place]) 09/14/2012   Hyperlipidemia associated with type 2 diabetes mellitus ([place])    Neuropathy ([place])     REFERRING DIAG: Z00.174B (ICD-10-CM) - Below-knee amputation of left lower extremity  THERAPY DIAG:  Unsteadiness on feet  Other abnormalities of gait and mobility  Abnormal posture  Muscle weakness (generalized)  Pain in left leg  Non-pressure chronic ulcer of skin of other sites with other specified severity (HCC)  Stiffness of left knee, not elsewhere classified  Rationale for Evaluation and Treatment Rehabilitation  PERTINENT HISTORY: braced right foot with cavovarus collapse, CAP without hypoxia, acute metabolic  encephalopathy, ESRD on HD Sun/W/F, afib s/p ablation, DM II, neuropathy, HTN, HLD, hypothyroidism, anemia, prostate cancer s/p TUPP. cervical myelopathy, gout, CAD w/CABG 2005  PRECAUTIONS: no BP in LUE, fall risk   SUBJECTIVE: He is doing well, still limited by pain with standing. It is throbbing pain over the shin bone. He has tried everything to stop the pain, occasionally coming on with sitting. He is using a gel instead of liquid baby oil over the shin bone and it is helping a little. He is continuing to do many short walks and reducing taking the w/c outside of the home, with occasionally practicing long walks at the gym.  PAIN:  Are you having pain?  No   OBJECTIVE: (objective measures completed at initial evaluation unless otherwise dated) COGNITION: Overall cognitive status: Within functional limits for tasks assessed              SENSATION: Not tested   POSTURE: rounded shoulders, forward head, and anterior pelvic tilt - RLE resting position with tibial IR, severe foot supination and inversion with cavovarus collapse  PALPATION 12/17/21 Tenderness to palpation at distal lateral tibia, potential bone spur formation at distal lateral tibia   LOWER EXTREMITY ROM:   ROM (A: AROM, P: PROM) Right eval Left eval  Hip flexion      Hip extension      Hip abduction      Hip adduction      Hip internal rotation      Hip external rotation      Knee flexion      Knee extension   Standing with RW and prosthesis  A: -16*  Ankle dorsiflexion      Ankle plantarflexion      Ankle inversion      Ankle eversion       (Blank rows = not tested)   LOWER EXTREMITY MMT:   MMT Right eval Left eval  Hip flexion      Hip extension      Hip abduction      Hip adduction      Hip internal rotation      Hip external rotation      Knee flexion      Knee extension      Ankle dorsiflexion      Ankle plantarflexion      Ankle inversion      Ankle eversion      (  Blank rows = not  tested)   Functional strength with standing and transfers 3- at hip and knee bil., with no motion at Rt ankle   TRANSFERS: Sit to stand: minA - fell backwards into chair upon first attempt, MinA for next attempt with RW stabilization and verbal cueing for foot placement Stand to sit: minA for controlling descent and stabilization   GAIT: Gait pattern:  adduction RLE with toe-in, step to pattern, decreased step length- Right, decreased stance time- Right, decreased stride length, decreased hip/knee flexion- Left, decreased ankle dorsiflexion- Right, Left hip hike, Right foot flat, knee flexed in stance- Right, knee flexed in stance- Left, lateral hip instability, lateral lean- Right, trunk flexed, and poor foot clearance- Right Distance walked: 35' Assistive device utilized: Environmental consultant - 2 wheeled & Left TTA prosthesis, right double upright AFO custom shoe Level of assistance: MinA to recover losses of balance with straight surface walking, ModA to recover losses of balance turning Lt, MaxA with loss of balance during turning Rt Comments: Rt lower leg positioning reduced foot clearance with catching Lt heel most steps, and promoted Rt lateral leaning/lateral loss of balance. Requires excessive BUE support on RW.   FUNCTIONAL TESTs:  Berg Balance Scale: 6/56, 13/56 with RW   [facility] PT Assessment - 11/13/21 0001                Standardized Balance Assessment    Standardized Balance Assessment Berg Balance Test          Berg Balance Test    Sit to Stand Needs moderate or maximal assist to stand   Berg task with RW = 1    Standing Unsupported Unable to stand 30 seconds unassisted   Berg task with RW = 2    Sitting with Back Unsupported but Feet Supported on Floor or Stool Able to sit safely and securely 2 minutes     Stand to Sit Needs assistance to sit   Berg task with RW = 2    Transfers Able to transfer with verbal cueing and /or supervision     Standing Unsupported with Eyes Closed Needs help  to keep from falling   Berg task with RW = 0    Standing Unsupported with Feet Together Needs help to attain position and unable to hold for 15 seconds   Berg task with RW = 0    From Standing, Reach Forward with Outstretched Arm Loses balance while trying/requires external support   Berg task with RW = 1 (CGA reaching with Lt)    From Standing Position, Pick up Object from Floor Unable to try/needs assist to keep balance   Berg task with RW = 0    From Standing Position, Turn to Look Behind Over each Shoulder Needs assist to keep from losing balance and falling   Berg task with RW = 0    Turn 360 Degrees Needs assistance while turning   Berg task with RW = 0    Standing Unsupported, Alternately Place Feet on Step/Stool Needs assistance to keep from falling or unable to try   Berg task with RW = 0    Standing Unsupported, One Foot in [employer] balance while stepping or standing   Berg task with RW = 0    Standing on One Leg Unable to try or needs assist to prevent fall   Berg task with RW = 1    Total Score 6     Berg comment: Berg tasks with RW =  13/56                     CURRENT PROSTHETIC WEAR ASSESSMENT: Patient is dependent with: skin check, residual limb care, care of non-amputated limb, prosthetic cleaning, ply sock cleaning, correct ply sock adjustment, proper wear schedule/adjustment, and proper weight-bearing schedule/adjustment Donning prosthesis: Min A and maximal cueing Doffing prosthesis: SBA Prosthetic wear tolerance: 30 minutes 1-2x/day 6 of 6 days since delivery Prosthetic weight bearing tolerance: stood ~5 minutes with c/o tibial crest pain moderate level after weight bearing Edema: present with slow capillary refill Residual limb condition: 1.6 cm scab along incision, cylinderical shape, normal color & temperature, mildly dry skin, slight scar adhesion at distal tibia,  Prosthetic description: silicone liner with pin lock suspension, 2nd pelite foam liner, total  contact socket, SACH foot     TODAY'S TREATMENT:  12/17/2021 Prosthetic Training with left Transtibial Prosthesis & right double upright AFO: PT instructed in proper donning of liner to prevent pulling over tibia, pt verbalized understanding PT educated on wear time of liner with nightly break to prevent skin breakdown, pt and wife verbalized understanding Pt referred to discuss distal shin pain with prosthetist at next appointment for possible socket revision to comfort, pt and wife verbalized understanding. See objective for palpation related to pain.   Pt amb 80', 15', 28' with rollator with CGA to SBA following PT demo of use, required visual cues for step width and verbal cues for step within walker  Pt STS rollator against wall x 2 with verbal cues for technique, locking brakes with min guard.  PT demo & verbal cues on ascend and descend curb with rollator, pt returned demo x 1 with minA and verbal cues for technique  12/13/2021 Prosthetic Training with left Transtibial Prosthesis & right double upright AFO: PT education on sock ply adjustments, he can have difficulty with this due to color blindness Sit to/from stand from w/c with armrests and chair without armrests to RW with SBA, demonstrated decreased control of descent to chair without armrests Pt amb 100' with RW and visual cues for step placement weaving around tall obstacles with SBA. Then another 100 feet with obstacles to navigate around.  Pt amb 6' to and from counter with RW to side step 8' Rt and Lt x 3 ea. with SBA/CGA following PT demo of weight shift and pushing to step, pt required cueing for upright posture PT demo & verbal cues on ascend and descend stairs with one handrail and with side stepping technique for 5 steps. He needed cues to allow room for trail leg to descend and place down on step.  Leg press machine DL 75# X 20, then SL 25# X 20 bilat Pt amb one more round of 75 feet with RW and SBA, with some carryover of  wider step length to visual targets on RW.  12/11/2021 Prosthetic Training with left Transtibial Prosthesis & right double upright AFO: PT education on sock bunching between liner and socket, pt verbalized understanding and returned demo of proper donning PT education on oil application only on spot of friction prior to donning liner, pt verbalized understanding PT suggestion to add regular sock to prosthetic foot inside shoe, pt verbalized understanding PT education on timeline for socket revision, K level indications for prosthesis elements, potential for swimming and risk of wound development and pt and wife verbalized understanding Sit to/from stand from w/c with armrests and chair without armrests to RW with SBA, demonstrated decreased control of descent  to chair without armrests Pt amb 100' with RW and visual cues for step placement weaving around tall obstacles with SBA Pt ascend and descend ramp with RW and wife and PT guarding with minA to control RW, pt reports high increase in Lt limb/knee pain. PT explain and demo ramp with RW and prosthesis with cues for hand and walker placement, foot placement, weight distrubution; pt and wife verbalize understanding Pt amb 6' to and from counter with RW to side step 8' Rt and Lt x 2 ea. with SBA/CGA following PT demo of weight shift and pushing to step, pt required cueing for upright posture Pt amb 25' with RW and visual cues for step placement with SBA PT demo & verbal cues on ascend and descend stairs with small base quad cane and handrail and with side stepping technique. Pt descend and ascend 2 steps with cane and Rt handrail and 2 steps with cane and Lt handrail ea. with minA (2nd person for safety) and verbal and tactile cueing for sequence   HOME EXERCISE PROGRAM:   11/22/21 1244  PT Education  Education Details HEP at sink  Person(s) Educated Patient;Spouse  Methods Explanation;Demonstration;Tactile cues;Verbal cues;Handout  Comprehension  Verbalized understanding;Returned demonstration;Verbal cues required;Tactile cues required;Need further instruction    Do each exercise 1-2  times per day Do each exercise 5-10 repetitions Hold each exercise for 2 seconds to feel your location  AT [place].  Try to find this position when standing still for activities.   USE TAPE ON FLOOR TO MARK THE MIDLINE POSITION which is even with middle of sink.  You also should try to feel with your limb pressure in socket.  You are trying to feel with limb what you used to feel with the bottom of your foot.  Side to Side Shift: Moving your hips only (not shoulders): move weight onto your left leg, HOLD/FEEL pressure in socket.  Move back to equal weight on each leg, HOLD/FEEL pressure in socket. Move weight onto your right leg, HOLD/FEEL pressure in socket. Move back to equal weight on each leg, HOLD/FEEL pressure in socket. Repeat.  Start with both hands on sink, then right hand only, then left hand only Front to Back Shift: Moving your hips only (not shoulders): move your weight forward onto your toes, HOLD/FEEL pressure in socket. Move your weight back to equal Flat Foot on both legs, HOLD/FEEL  pressure in socket. Move your weight back onto your heels, HOLD/FEEL  pressure in socket. Move your weight back to equal on both legs, HOLD/FEEL  pressure in socket. Repeat.  Start with both hands on sink, then right hand only, then left hand only  Moving Cones / Cups: With equal weight on each leg: Hold on with one hand the first time, then progress to no hand supports. Move cups from one side of sink to the other. Place cups ~2" out of your reach, progress to 10" beyond reach.  Place one hand in middle of sink and reach with other hand. Do both arms.  Then hover one hand and move cups with other hand.  Overhead/Upward Reaching: alternated reaching up to top cabinets or ceiling if no cabinets  present. Keep equal weight on each leg. Start with one hand support on counter while other hand reaches and progress to no hand support with reaching.  ace one hand in middle of sink and reach with other hand. Do both arms.  Looking Over Shoulders: With equal weight on each leg: alternate turning to look over your shoulders with one hand support on counter as needed.  Start with head motions only to look in front of shoulder, then even with shoulder and progress to looking behind you. To look to side, move head /eyes, then shoulder on side looking pulls back, shift more weight to side looking and pull hip back. Place one hand in middle of sink and let go with other hand so your shoulder can pull back. Switch hands to look other way. If looking right, use left hand at sink. If looking left, use right hand at sink. 6.  Stepping with leg tapping forefoot into lower cabinet:  Move items under cabinet out of your way. Shift your hips/pelvis so weight on right side. Tighten muscles in hip on right side.  SLOWLY step left leg so front of foot is in cabinet. Then step back to floor.  Repeat with other leg    ASSESSMENT:   CLINICAL IMPRESSION: In this 10 visit progress note period, he has improved in his prosthesis wear time and management, including sweat management, sock adjustment, and pain relief techniques. He remains currently limited by high amounts of distal shin pain with standing. His functional mobility is continuing to improve, ambulating in clinic today with a rollator with SBA and progressing with curb, ramp, and stair mobility. He continues to benefit from skilled PT to address prosthetic care and functional limitations. He may benefit from prosthetist grinding lateral tibia area on socket where he has pain issues.    OBJECTIVE IMPAIRMENTS Abnormal gait, decreased activity tolerance, decreased balance, decreased coordination, decreased endurance, decreased knowledge of use of DME, decreased  mobility, difficulty walking, decreased ROM, decreased strength, decreased safety awareness, increased edema, impaired flexibility, postural dysfunction, prosthetic dependency , and pain.    ACTIVITY LIMITATIONS bending, sitting, standing, squatting, stairs, transfers, bathing, dressing, hygiene/grooming, locomotion level, and prosthetic use   PARTICIPATION LIMITATIONS: cleaning, laundry, and community activity   PERSONAL FACTORS Fitness, Time since onset of injury/illness/exacerbation, and 3+ comorbidities: see PMH  are also affecting patient's functional outcome.    REHAB POTENTIAL: Good   CLINICAL DECISION MAKING: Evolving/moderate complexity   EVALUATION COMPLEXITY: Moderate     GOALS: Goals reviewed with patient? Yes   SHORT TERM GOALS: Target date: 12/14/21   Patient independent with donning/doffing prosthesis. Baseline: see objective data Goal status: partially met 12/17/2021   2.  Patient verbalize proper assessment of wound with prosthetic use.  Baseline: see objective data Goal status: met 12/11/21   3.  Patient wears prosthesis >/= 8 hours/day daily without increase in wound & limb pain </= 5/10 to facilitate functional mobility and safety with transfers. Baseline: see objective data Goal status: met 12/05/21   4.  Patient able to transfer sit to/from stand chair with armrests with RW with supervision. Baseline: see objective data Goal status: met 12/05/21   5.  Patient ambulates 42' with RW & prosthesis / AFO with minA including negotiating around obstacles.  Baseline: see objective data Goal status: met 12/11/21 with SBA  UPDATED SHORT TERM GOALS: Target date: 01/11/22  Patient independent with donning/doffing prosthesis. Baseline: see objective data Goal status: ongoing 12/17/21  Patient able to transfer sit to/from stand chair without armrests with RW with supervision. Baseline: see objective data Goal status: new 12/17/21  Patient ambulates 100' with rollator &  prosthesis / AFO with supervision including ramp and curb. Baseline: see objective data Goal status: new 12/17/21  LONG TERM GOALS: Target date: 02/07/22   Patient wears prosthesis > 90% of waking hours with no limb pain or skin issues to promote function and safety. Baseline: see objective data Goal status: INITIAL   2.  Patient independent with all aspects of prosthetic care to enable safe utilization of prosthesis. Baseline: see objective data Goal status: INITIAL   3.  Patient ambulates 74' with LRAD safely and modified independent to allow for household mobility. Baseline: see objective data Goal status: INITIAL   4.  Patient able to ambulate 200' and navigate ramp and curb with LRAD safely with minA/ caregiver assist to allow for community mobility. Baseline: see objective data Goal status: INITIAL   5.  Berg balance score >/= 30/56 with RW to indicate low fall risk during household level balance requirements with UE support. Baseline: see objective data Goal status: INITIAL     PLAN: PT FREQUENCY: 2x/week   PT DURATION: other: 90 days   PLANNED INTERVENTIONS: Therapeutic exercises, Therapeutic activity, Neuromuscular re-education, Balance training, Gait training, Patient/Family education, Self Care, Stair training, Vestibular training, Orthotic/Fit training, Prosthetic training, DME instructions, Moist heat, scar mobilization, Manual therapy, Re-evaluation, and physical performance tests   PLAN FOR NEXT SESSION: ramp and curb with rollator, STS with rollator away from wall. Standing balance activities.  Jana Hakim, Student-PT 12/17/2021, 1:04 PM  This entire session of physical therapy was performed under the direct supervision of PT signing evaluation /treatment. PT reviewed note and agrees.  Jamey Reas, PT, DPT 12/17/2021, 2:56 PM

## 2021-12-20 ENCOUNTER — Encounter: Payer: Self-pay | Admitting: Nurse Practitioner

## 2021-12-20 ENCOUNTER — Encounter: Payer: Self-pay | Admitting: Physical Therapy

## 2021-12-20 ENCOUNTER — Ambulatory Visit (INDEPENDENT_AMBULATORY_CARE_PROVIDER_SITE_OTHER): Payer: Medicare Other | Admitting: Physical Therapy

## 2021-12-20 ENCOUNTER — Ambulatory Visit (INDEPENDENT_AMBULATORY_CARE_PROVIDER_SITE_OTHER): Payer: Medicare Other | Admitting: Nurse Practitioner

## 2021-12-20 ENCOUNTER — Other Ambulatory Visit (INDEPENDENT_AMBULATORY_CARE_PROVIDER_SITE_OTHER): Payer: Medicare Other

## 2021-12-20 VITALS — BP 142/86 | HR 64 | Ht 71.0 in

## 2021-12-20 DIAGNOSIS — R630 Anorexia: Secondary | ICD-10-CM

## 2021-12-20 DIAGNOSIS — R2681 Unsteadiness on feet: Secondary | ICD-10-CM | POA: Diagnosis not present

## 2021-12-20 DIAGNOSIS — M79605 Pain in left leg: Secondary | ICD-10-CM

## 2021-12-20 DIAGNOSIS — N186 End stage renal disease: Secondary | ICD-10-CM

## 2021-12-20 DIAGNOSIS — D696 Thrombocytopenia, unspecified: Secondary | ICD-10-CM

## 2021-12-20 DIAGNOSIS — M6281 Muscle weakness (generalized): Secondary | ICD-10-CM | POA: Diagnosis not present

## 2021-12-20 DIAGNOSIS — R110 Nausea: Secondary | ICD-10-CM

## 2021-12-20 DIAGNOSIS — R2689 Other abnormalities of gait and mobility: Secondary | ICD-10-CM

## 2021-12-20 DIAGNOSIS — R293 Abnormal posture: Secondary | ICD-10-CM

## 2021-12-20 LAB — CBC WITH DIFFERENTIAL/PLATELET
Basophils Absolute: 0.1 10*3/uL (ref 0.0–0.1)
Basophils Relative: 1.5 % (ref 0.0–3.0)
Eosinophils Absolute: 0.1 10*3/uL (ref 0.0–0.7)
Eosinophils Relative: 2.4 % (ref 0.0–5.0)
HCT: 30 % — ABNORMAL LOW (ref 39.0–52.0)
Hemoglobin: 10.1 g/dL — ABNORMAL LOW (ref 13.0–17.0)
Lymphocytes Relative: 7 % — ABNORMAL LOW (ref 12.0–46.0)
Lymphs Abs: 0.3 10*3/uL — ABNORMAL LOW (ref 0.7–4.0)
MCHC: 33.6 g/dL (ref 30.0–36.0)
MCV: 82.9 fl (ref 78.0–100.0)
Monocytes Absolute: 0.4 10*3/uL (ref 0.1–1.0)
Monocytes Relative: 9 % (ref 3.0–12.0)
Neutro Abs: 3.9 10*3/uL (ref 1.4–7.7)
Neutrophils Relative %: 80.1 % — ABNORMAL HIGH (ref 43.0–77.0)
Platelets: 77 10*3/uL — ABNORMAL LOW (ref 150.0–400.0)
RBC: 3.62 Mil/uL — ABNORMAL LOW (ref 4.22–5.81)
RDW: 19.3 % — ABNORMAL HIGH (ref 11.5–15.5)
WBC: 4.8 10*3/uL (ref 4.0–10.5)

## 2021-12-20 LAB — HEPATIC FUNCTION PANEL
ALT: 11 U/L (ref 0–53)
AST: 12 U/L (ref 0–37)
Albumin: 4.1 g/dL (ref 3.5–5.2)
Alkaline Phosphatase: 135 U/L — ABNORMAL HIGH (ref 39–117)
Bilirubin, Direct: 0.5 mg/dL — ABNORMAL HIGH (ref 0.0–0.3)
Total Bilirubin: 1.3 mg/dL — ABNORMAL HIGH (ref 0.2–1.2)
Total Protein: 6.2 g/dL (ref 6.0–8.3)

## 2021-12-20 NOTE — Therapy (Signed)
OUTPATIENT PHYSICAL THERAPY TREATMENT NOTE    Patient Name: [name] MRN: [id number] DOB:[birth year], 71 y.o., male Today's Date: 12/20/2021  PCP: Lorenda Hatchet, FNP REFERRING PROVIDER: [name], MD     END OF SESSION:   PT End of Session - 12/20/21 0930     Visit Number 11    Number of Visits 25    Date for PT Re-Evaluation 02/07/22    Authorization Type Medicare and BCBS    Progress Note Due on Visit 20    PT Start Time 0930    PT Stop Time 1010    PT Time Calculation (min) 40 min    Equipment Utilized During Treatment Gait belt    Activity Tolerance Patient limited by fatigue    Behavior During Therapy WFL for tasks assessed/performed             Past Medical History:  Diagnosis Date   Ambulates with cane    straight cane   Anemia    Cervical myelopathy ([place]) 02/06/2018   Chronic kidney disease    dailysis M W F- home   Complication of anesthesia    Coronary artery disease    Diabetes mellitus without complication ([place])    type2   Diabetic foot ulcer ([place])    Diabetic neuropathy ([place]) 02/06/2018   Gait abnormality 08/24/2018   GERD (gastroesophageal reflux disease)     01/06/20- not current   Gout    History of blood transfusion    History of blood transfusion    History of kidney stones    passed stones   Hypercholesteremia    Hypertension    Hypothyroidism    Neuromuscular disorder ([place])    neuropathy left leg and bilateral feet   Neuropathy    PONV (postoperative nausea and vomiting)    Prostate cancer ([place])    PVD (peripheral vascular disease) ([place])    with amputations   Past Surgical History:  Procedure Laterality Date   A-FLUTTER ABLATION N/A 04/06/2020   Procedure: A-FLUTTER ABLATION;  Surgeon: [name], MD;  Location: [facility];  Service: Cardiovascular;  Laterality: N/A;   AMPUTATION Left 12/25/2013   Procedure: AMPUTATION RAY LEFT 5TH RAY;  Surgeon: [name], MD;  Location: [facility];  Service:  Orthopedics;  Laterality: Left;   AMPUTATION Right 12/15/2018   Procedure: AMPUTATION OF 4TH AND 5TH TOES RIGHT FOOT;  Surgeon: [name], MD;  Location: [facility];  Service: Orthopedics;  Laterality: Right;   AMPUTATION Left 02/02/2021   Procedure: LEFT FOOT 4TH RAY AMPUTATION;  Surgeon: [name], MD;  Location: [facility];  Service: Orthopedics;  Laterality: Left;   AMPUTATION Left 05/09/2021   Procedure: LEFT BELOW KNEE AMPUTATION;  Surgeon: [name], MD;  Location: [facility];  Service: Orthopedics;  Laterality: Left;   APPLICATION OF WOUND VAC Right 12/15/2018   Procedure: APPLICATION OF WOUND VAC;  Surgeon: [name], MD;  Location: [facility];  Service: Orthopedics;  Laterality: Right;   AV FISTULA PLACEMENT Left 03/25/2019   Procedure: LEFT ARM ARTERIOVENOUS (AV) FISTULA CREATION;  Surgeon: [name], MD;  Location: [facility];  Service: Vascular;  Laterality: Left;   [place] Left 05/20/2019   Procedure: SECOND STAGE LEFT BASCILIC VEIN TRANSPOSITION;  Surgeon: [name], MD;  Location: [facility];  Service: Vascular;  Laterality: Left;   CARDIAC CATHETERIZATION  02/17/2014   CHOLECYSTECTOMY  COLONOSCOPY  2011   in [place], Normal   CORONARY ARTERY BYPASS GRAFT  2008   CYSTOSCOPY N/A 06/29/2020   Procedure: CYSTOSCOPY WITH CLOT EVACUATION AND  FULGERATION;  Surgeon: [name], MD;  Location: [facility];  Service: Urology;  Laterality: N/A;   CYSTOSCOPY WITH FULGERATION Bilateral 06/17/2020   Procedure: CYSTOSCOPY,BILATERAL RETROGRADE, CLOT EVACUATION WITH FULGERATION OF THE BLADDER;  Surgeon: Janith Lima, MD;  Location: [facility];  Service: Urology;  Laterality: Bilateral;   CYSTOSCOPY WITH FULGERATION N/A 06/28/2020   Procedure: CYSTOSCOPY WITH CLOT EVACUATION AND FULGERATION OF BLEEDERS;  Surgeon: [name], MD;  Location: [facility];  Service: Urology;  Laterality: N/A;  1 HR   I & D EXTREMITY Right 12/15/2018   Procedure: DEBRIDEMENT  RIGHT FOOT;  Surgeon: [name], MD;  Location: [facility];  Service: Orthopedics;  Laterality: Right;   I & D EXTREMITY Right 08/27/2019   Procedure: PARTIAL CUBOID EXCISION RIGHT FOOT;  Surgeon: [name], MD;  Location: [facility];  Service: Orthopedics;  Laterality: Right;   I & D EXTREMITY Right 01/07/2020   Procedure: RIGHT FOOT EXCISION INFECTED BONE;  Surgeon: [name], MD;  Location: [facility];  Service: Orthopedics;  Laterality: Right;   NECK SURGERY     novemver 2019   STUMP REVISION Left 06/27/2021   Procedure: REVISION LEFT BELOW KNEE AMPUTATION;  Surgeon: [name], MD;  Location: [facility];  Service: Orthopedics;  Laterality: Left;   TRANSURETHRAL RESECTION OF PROSTATE N/A 06/28/2020   Procedure: TRANSURETHRAL RESECTION OF THE PROSTATE (TURP);  Surgeon: [name], MD;  Location: [facility];  Service: Urology;  Laterality: N/A;   WISDOM TOOTH EXTRACTION     Patient Active Problem List   Diagnosis Date Noted   Community acquired pneumonia of left lower lobe of lung 08/18/2021   Paroxysmal atrial fibrillation ([place]) 08/18/2021   Anemia of chronic renal failure 08/18/2021   Thrombocytopenia ([place]) 66/29/4765   Acute metabolic encephalopathy 46/50/3546   Action induced myoclonus 08/18/2021   Dehiscence of amputation stump of left lower extremity ([place])    Left below-knee amputee ([place]) 05/11/2021   S/P BKA (below knee amputation) unilateral, left ([place]) 05/09/2021   Partial nontraumatic amputation of foot, left ([place]) 02/20/2021   Abscess of bursa of left foot 02/03/2021   Cutaneous abscess of left foot    Acute osteomyelitis of metatarsal bone of left foot (H[facility])    Symptomatic anemia 09/13/2020   COVID-19 virus infection 09/13/2020   Pressure injury of skin 06/29/2020   Gross hematuria 06/16/2020   Typical atrial flutter ([place]) 03/24/2020   Bilateral pleural effusion 02/24/2020   Healthcare maintenance 01/28/2020   Shortness of breath 01/28/2020   History of partial ray  amputation of fourth toe of right foot ([place]) 09/08/2019   Ulcerated, foot, right, with necrosis of bone (H[facility])    Chronic cough 06/25/2019   Cutaneous abscess of right foot    Subacute osteomyelitis of right foot ([place]) 12/14/2018   AKI (acute kidney injury) ([place]) 12/14/2018   ESRD on hemodialysis ([place]) 12/14/2018   Gait abnormality 08/24/2018   Diabetic neuropathy ([place]) 02/06/2018   Cervical myelopathy ([place]) 02/06/2018   Onychomycosis 10/30/2017   Other spondylosis with radiculopathy, cervical region 01/27/2017   Midfoot ulcer, right, limited to breakdown of skin ([place]) 11/15/2016   Lateral epicondylitis, left elbow 08/12/2016   Cellulitis of fifth toe of right foot 07/29/2016   Prostate cancer ([place]) 06/19/2016   Non-pressure chronic ulcer of other part  of right foot limited to breakdown of skin ([place]) 04/04/2016   Cellulitis of leg, right 12/24/2015   Cellulitis of right lower extremity 12/24/2015   Gout 09/14/2012   Hypertension associated with diabetes ([place]) 09/14/2012   Hypothyroidism 09/14/2012   CAD (coronary artery disease) 09/14/2012   Insulin dependent type 2 diabetes mellitus ([place]) 09/14/2012   Hyperlipidemia associated with type 2 diabetes mellitus ([place])    Neuropathy ([place])     REFERRING DIAG: E55.015A (ICD-10-CM) - Below-knee amputation of left lower extremity  THERAPY DIAG:  Unsteadiness on feet  Other abnormalities of gait and mobility  Abnormal posture  Muscle weakness (generalized)  Pain in left leg  Rationale for Evaluation and Treatment Rehabilitation  PERTINENT HISTORY: braced right foot with cavovarus collapse, CAP without hypoxia, acute metabolic encephalopathy, ESRD on HD Sun/W/F, afib s/p ablation, DM II, neuropathy, HTN, HLD, hypothyroidism, anemia, prostate cancer s/p TUPP. cervical myelopathy, gout, CAD w/CABG 2005  PRECAUTIONS: no BP in LUE, fall risk   SUBJECTIVE: He relays continued pain in distal residual limb shin. He will see prosthetist  next Thursday. He relays he fell on Tuesday or Wednesday when he stepped up the single step in the den and was not paying enough attention and he feel back and landed on his butt. He denies injury from this.   PAIN:  Are you having pain?  Yes 5/10 pain in distal shin and 3-4 pain in glutes   OBJECTIVE: (objective measures completed at initial evaluation unless otherwise dated) COGNITION: Overall cognitive status: Within functional limits for tasks assessed              SENSATION: Not tested   POSTURE: rounded shoulders, forward head, and anterior pelvic tilt - RLE resting position with tibial IR, severe foot supination and inversion with cavovarus collapse  PALPATION 12/17/21 Tenderness to palpation at distal lateral tibia, potential bone spur formation at distal lateral tibia   LOWER EXTREMITY ROM:   ROM (A: AROM, P: PROM) Right eval Left eval  Hip flexion      Hip extension      Hip abduction      Hip adduction      Hip internal rotation      Hip external rotation      Knee flexion      Knee extension   Standing with RW and prosthesis  A: -16*  Ankle dorsiflexion      Ankle plantarflexion      Ankle inversion      Ankle eversion       (Blank rows = not tested)   LOWER EXTREMITY MMT:   MMT Right eval Left eval  Hip flexion      Hip extension      Hip abduction      Hip adduction      Hip internal rotation      Hip external rotation      Knee flexion      Knee extension      Ankle dorsiflexion      Ankle plantarflexion      Ankle inversion      Ankle eversion      (Blank rows = not tested)   Functional strength with standing and transfers 3- at hip and knee bil., with no motion at Rt ankle   TRANSFERS: Sit to stand: minA - fell backwards into chair upon first attempt, MinA for next attempt with RW stabilization and verbal cueing for foot placement Stand to sit: minA for controlling  descent and stabilization   GAIT: Gait pattern:  adduction RLE with  toe-in, step to pattern, decreased step length- Right, decreased stance time- Right, decreased stride length, decreased hip/knee flexion- Left, decreased ankle dorsiflexion- Right, Left hip hike, Right foot flat, knee flexed in stance- Right, knee flexed in stance- Left, lateral hip instability, lateral lean- Right, trunk flexed, and poor foot clearance- Right Distance walked: 35' Assistive device utilized: Environmental consultant - 2 wheeled & Left TTA prosthesis, right double upright AFO custom shoe Level of assistance: MinA to recover losses of balance with straight surface walking, ModA to recover losses of balance turning Lt, MaxA with loss of balance during turning Rt Comments: Rt lower leg positioning reduced foot clearance with catching Lt heel most steps, and promoted Rt lateral leaning/lateral loss of balance. Requires excessive BUE support on RW.   FUNCTIONAL TESTs:  Berg Balance Scale: 6/56, 13/56 with RW   [facility] PT Assessment - 11/13/21 0001                Standardized Balance Assessment    Standardized Balance Assessment Berg Balance Test          Berg Balance Test    Sit to Stand Needs moderate or maximal assist to stand   Berg task with RW = 1    Standing Unsupported Unable to stand 30 seconds unassisted   Berg task with RW = 2    Sitting with Back Unsupported but Feet Supported on Floor or Stool Able to sit safely and securely 2 minutes     Stand to Sit Needs assistance to sit   Berg task with RW = 2    Transfers Able to transfer with verbal cueing and /or supervision     Standing Unsupported with Eyes Closed Needs help to keep from falling   Berg task with RW = 0    Standing Unsupported with Feet Together Needs help to attain position and unable to hold for 15 seconds   Berg task with RW = 0    From Standing, Reach Forward with Outstretched Arm Loses balance while trying/requires external support   Berg task with RW = 1 (CGA reaching with Lt)    From Standing Position, Pick up Object from  Floor Unable to try/needs assist to keep balance   Berg task with RW = 0    From Standing Position, Turn to Look Behind Over each Shoulder Needs assist to keep from losing balance and falling   Berg task with RW = 0    Turn 360 Degrees Needs assistance while turning   Berg task with RW = 0    Standing Unsupported, Alternately Place Feet on Step/Stool Needs assistance to keep from falling or unable to try   Berg task with RW = 0    Standing Unsupported, One Foot in [employer] balance while stepping or standing   Berg task with RW = 0    Standing on One Leg Unable to try or needs assist to prevent fall   Berg task with RW = 1    Total Score 6     Berg comment: Berg tasks with RW = 13/56                     CURRENT PROSTHETIC WEAR ASSESSMENT: Patient is dependent with: skin check, residual limb care, care of non-amputated limb, prosthetic cleaning, ply sock cleaning, correct ply sock adjustment, proper wear schedule/adjustment, and proper weight-bearing schedule/adjustment Donning prosthesis: Min A and  maximal cueing Doffing prosthesis: SBA Prosthetic wear tolerance: 30 minutes 1-2x/day 6 of 6 days since delivery Prosthetic weight bearing tolerance: stood ~5 minutes with c/o tibial crest pain moderate level after weight bearing Edema: present with slow capillary refill Residual limb condition: 1.6 cm scab along incision, cylinderical shape, normal color & temperature, mildly dry skin, slight scar adhesion at distal tibia,  Prosthetic description: silicone liner with pin lock suspension, 2nd pelite foam liner, total contact socket, SACH foot     TODAY'S TREATMENT:  12/20/2021 Prosthetic Training with left Transtibial Prosthesis & right double upright AFO: Pt amb 80 feet X 2, 40 feet X2 with rollator with CGA to SBA following PT demo of use, required visual cues for step width and verbal cues for step within walker  PT demo & verbal cues on ascend and descend curb with rollator, pt returned  demo x 1 with minA and verbal cues for technique Therex: Leg press machine DL 75# 2X10, then SL 25# 2X10 bilat Nu step L5 X 8  min with rest breaks PRN.  12/17/2021 Prosthetic Training with left Transtibial Prosthesis & right double upright AFO: PT instructed in proper donning of liner to prevent pulling over tibia, pt verbalized understanding PT educated on wear time of liner with nightly break to prevent skin breakdown, pt and wife verbalized understanding Pt referred to discuss distal shin pain with prosthetist at next appointment for possible socket revision to comfort, pt and wife verbalized understanding. See objective for palpation related to pain.   Pt amb 80', 15', 79' with rollator with CGA to SBA following PT demo of use, required visual cues for step width and verbal cues for step within walker  Pt STS rollator against wall x 2 with verbal cues for technique, locking brakes with min guard.  PT demo & verbal cues on ascend and descend curb with rollator, pt returned demo x 1 with minA and verbal cues for technique  12/13/2021 Prosthetic Training with left Transtibial Prosthesis & right double upright AFO: PT education on sock ply adjustments, he can have difficulty with this due to color blindness Sit to/from stand from w/c with armrests and chair without armrests to RW with SBA, demonstrated decreased control of descent to chair without armrests Pt amb 100' with RW and visual cues for step placement weaving around tall obstacles with SBA. Then another 100 feet with obstacles to navigate around.  Pt amb 6' to and from counter with RW to side step 8' Rt and Lt x 3 ea. with SBA/CGA following PT demo of weight shift and pushing to step, pt required cueing for upright posture PT demo & verbal cues on ascend and descend stairs with one handrail and with side stepping technique for 5 steps. He needed cues to allow room for trail leg to descend and place down on step.  Leg press machine DL 75#  X 20, then SL 25# X 20 bilat Pt amb one more round of 75 feet with RW and SBA, with some carryover of wider step length to visual targets on RW.  12/11/2021 Prosthetic Training with left Transtibial Prosthesis & right double upright AFO: PT education on sock bunching between liner and socket, pt verbalized understanding and returned demo of proper donning PT education on oil application only on spot of friction prior to donning liner, pt verbalized understanding PT suggestion to add regular sock to prosthetic foot inside shoe, pt verbalized understanding PT education on timeline for socket revision, K level indications for prosthesis  elements, potential for swimming and risk of wound development and pt and wife verbalized understanding Sit to/from stand from w/c with armrests and chair without armrests to RW with SBA, demonstrated decreased control of descent to chair without armrests Pt amb 100' with RW and visual cues for step placement weaving around tall obstacles with SBA Pt ascend and descend ramp with RW and wife and PT guarding with minA to control RW, pt reports high increase in Lt limb/knee pain. PT explain and demo ramp with RW and prosthesis with cues for hand and walker placement, foot placement, weight distrubution; pt and wife verbalize understanding Pt amb 6' to and from counter with RW to side step 8' Rt and Lt x 2 ea. with SBA/CGA following PT demo of weight shift and pushing to step, pt required cueing for upright posture Pt amb 25' with RW and visual cues for step placement with SBA PT demo & verbal cues on ascend and descend stairs with small base quad cane and handrail and with side stepping technique. Pt descend and ascend 2 steps with cane and Rt handrail and 2 steps with cane and Lt handrail ea. with minA (2nd person for safety) and verbal and tactile cueing for sequence   HOME EXERCISE PROGRAM:   11/22/21 1244  PT Education  Education Details HEP at sink  Person(s)  Educated Patient;Spouse  Methods Explanation;Demonstration;Tactile cues;Verbal cues;Handout  Comprehension Verbalized understanding;Returned demonstration;Verbal cues required;Tactile cues required;Need further instruction    Do each exercise 1-2  times per day Do each exercise 5-10 repetitions Hold each exercise for 2 seconds to feel your location  AT [place].  Try to find this position when standing still for activities.   USE TAPE ON FLOOR TO MARK THE MIDLINE POSITION which is even with middle of sink.  You also should try to feel with your limb pressure in socket.  You are trying to feel with limb what you used to feel with the bottom of your foot.  Side to Side Shift: Moving your hips only (not shoulders): move weight onto your left leg, HOLD/FEEL pressure in socket.  Move back to equal weight on each leg, HOLD/FEEL pressure in socket. Move weight onto your right leg, HOLD/FEEL pressure in socket. Move back to equal weight on each leg, HOLD/FEEL pressure in socket. Repeat.  Start with both hands on sink, then right hand only, then left hand only Front to Back Shift: Moving your hips only (not shoulders): move your weight forward onto your toes, HOLD/FEEL pressure in socket. Move your weight back to equal Flat Foot on both legs, HOLD/FEEL  pressure in socket. Move your weight back onto your heels, HOLD/FEEL  pressure in socket. Move your weight back to equal on both legs, HOLD/FEEL  pressure in socket. Repeat.  Start with both hands on sink, then right hand only, then left hand only  Moving Cones / Cups: With equal weight on each leg: Hold on with one hand the first time, then progress to no hand supports. Move cups from one side of sink to the other. Place cups ~2" out of your reach, progress to 10" beyond reach.  Place one hand in middle of sink and reach with other hand. Do both arms.  Then hover one hand and move cups with  other hand.  Overhead/Upward Reaching: alternated reaching up to top cabinets or ceiling if no cabinets present. Keep equal weight on each leg. Start  with one hand support on counter while other hand reaches and progress to no hand support with reaching.  ace one hand in middle of sink and reach with other hand. Do both arms.   Looking Over Shoulders: With equal weight on each leg: alternate turning to look over your shoulders with one hand support on counter as needed.  Start with head motions only to look in front of shoulder, then even with shoulder and progress to looking behind you. To look to side, move head /eyes, then shoulder on side looking pulls back, shift more weight to side looking and pull hip back. Place one hand in middle of sink and let go with other hand so your shoulder can pull back. Switch hands to look other way. If looking right, use left hand at sink. If looking left, use right hand at sink. 6.  Stepping with leg tapping forefoot into lower cabinet:  Move items under cabinet out of your way. Shift your hips/pelvis so weight on right side. Tighten muscles in hip on right side.  SLOWLY step left leg so front of foot is in cabinet. Then step back to floor.  Repeat with other leg    ASSESSMENT:   CLINICAL IMPRESSION: Standing activity limited by distal shin pain in residual limb, we tried doffing and redonning prosthesis which helped slightly but he was still limited by pain. He will follow up with prosthetist next week to see if any adjustments can be made.    OBJECTIVE IMPAIRMENTS Abnormal gait, decreased activity tolerance, decreased balance, decreased coordination, decreased endurance, decreased knowledge of use of DME, decreased mobility, difficulty walking, decreased ROM, decreased strength, decreased safety awareness, increased edema, impaired flexibility, postural dysfunction, prosthetic dependency , and pain.    ACTIVITY LIMITATIONS bending, sitting, standing, squatting,  stairs, transfers, bathing, dressing, hygiene/grooming, locomotion level, and prosthetic use   PARTICIPATION LIMITATIONS: cleaning, laundry, and community activity   PERSONAL FACTORS Fitness, Time since onset of injury/illness/exacerbation, and 3+ comorbidities: see PMH  are also affecting patient's functional outcome.    REHAB POTENTIAL: Good   CLINICAL DECISION MAKING: Evolving/moderate complexity   EVALUATION COMPLEXITY: Moderate     GOALS: Goals reviewed with patient? Yes   SHORT TERM GOALS: Target date: 12/14/21   Patient independent with donning/doffing prosthesis. Baseline: see objective data Goal status: partially met 12/17/2021   2.  Patient verbalize proper assessment of wound with prosthetic use.  Baseline: see objective data Goal status: met 12/11/21   3.  Patient wears prosthesis >/= 8 hours/day daily without increase in wound & limb pain </= 5/10 to facilitate functional mobility and safety with transfers. Baseline: see objective data Goal status: met 12/05/21   4.  Patient able to transfer sit to/from stand chair with armrests with RW with supervision. Baseline: see objective data Goal status: met 12/05/21   5.  Patient ambulates 35' with RW & prosthesis / AFO with minA including negotiating around obstacles.  Baseline: see objective data Goal status: met 12/11/21 with SBA  UPDATED SHORT TERM GOALS: Target date: 01/11/22  Patient independent with donning/doffing prosthesis. Baseline: see objective data Goal status: ongoing 12/17/21  Patient able to transfer sit to/from stand chair without armrests with RW with supervision. Baseline: see objective data Goal status: new 12/17/21  Patient ambulates 100' with rollator & prosthesis / AFO with supervision including ramp and curb. Baseline: see objective data Goal status: new 12/17/21   LONG TERM GOALS: Target date: 02/07/22   Patient wears prosthesis > 90% of waking  hours with no limb pain or skin issues to promote  function and safety. Baseline: see objective data Goal status: INITIAL   2.  Patient independent with all aspects of prosthetic care to enable safe utilization of prosthesis. Baseline: see objective data Goal status: INITIAL   3.  Patient ambulates 22' with LRAD safely and modified independent to allow for household mobility. Baseline: see objective data Goal status: INITIAL   4.  Patient able to ambulate 200' and navigate ramp and curb with LRAD safely with minA/ caregiver assist to allow for community mobility. Baseline: see objective data Goal status: INITIAL   5.  Berg balance score >/= 30/56 with RW to indicate low fall risk during household level balance requirements with UE support. Baseline: see objective data Goal status: INITIAL     PLAN: PT FREQUENCY: 2x/week   PT DURATION: other: 90 days   PLANNED INTERVENTIONS: Therapeutic exercises, Therapeutic activity, Neuromuscular re-education, Balance training, Gait training, Patient/Family education, Self Care, Stair training, Vestibular training, Orthotic/Fit training, Prosthetic training, DME instructions, Moist heat, scar mobilization, Manual therapy, Re-evaluation, and physical performance tests   PLAN FOR NEXT SESSION: ramp and curb with rollator, STS with rollator away from wall. Standing balance activities.  Debbe Odea, PT,DPT 12/20/2021, 9:31 AM

## 2021-12-20 NOTE — Patient Instructions (Addendum)
If you are age 72 or older, your body mass index should be between 23-30. Your Body mass index is 26.92 kg/m. If this is out of the aforementioned range listed, please consider follow up with your Primary Care Provider.  If you are age 20 or younger, your body mass index should be between 19-25. Your Body mass index is 26.92 kg/m. If this is out of the aformentioned range listed, please consider follow up with your Primary Care Provider.   Start FD guard by mouth twice daily.  You will be contacted by [facility] in the next 2 days to arrange a Ultrasound.  The number on your caller ID will be [phone], please answer when they call.  If you have not heard from them in 2 days please call [phone] to schedule.    Your provider has requested that you go to the basement level for lab work before leaving today. Press "B" on the elevator. The lab is located at the first door on the left as you exit the elevator.   [facility] providers would like to encourage you to use [facility] to communicate with providers for non-urgent requests or questions.  Due to long hold times on the telephone, sending your provider a message by [facility] may be a faster and more efficient way to get a response.  Please allow 48 business hours for a response.  Please remember that this is for non-urgent requests.   Due to recent changes in healthcare laws, you may see the results of your imaging and laboratory studies on MyChart before your provider has had a chance to review them.  We understand that in some cases there may be results that are confusing or concerning to you. Not all laboratory results come back in the same time frame and the provider may be waiting for multiple results in order to interpret others.  Please give [place] 48 hours in order for your provider to thoroughly review all the results before contacting the office for clarification of your results.    It was a pleasure to see you  today!  Thank you for trusting me with your gastrointestinal care!

## 2021-12-20 NOTE — Progress Notes (Unsigned)
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 Past cholecytectomy 1988.   Decreased appetite.  Nausea is less but still has some nausea twice weekly lasts 10 minutes  Not vomiting  4 hrs.   Sometimes nausea occurs during dialysis   10.5 Hg Misera, Iron x 10 doses IV last infusion 3 to 4 weeks ago  Weight at dialysis 193 Kg. 87 kg 9/13.  Formed stools brown.  No blood or black stools. Sometimes flushes.   Dropped dry weight        Latest Ref Rng & Units 08/20/2021    1:44 AM 08/19/2021    6:10 AM 08/18/2021    8:34 PM  CBC  WBC 4.0 - 10.5 K/uL 4.7  6.3  6.3   Hemoglobin 13.0 - 17.0 g/dL 9.0  9.7  11.6   Hematocrit 39.0 - 52.0 % 27.8  31.1  35.0   Platelets 150 - 400 K/uL 82  83  113          Latest Ref Rng & Units 08/20/2021    1:44 AM 08/19/2021    6:10 AM 08/18/2021    8:52 PM  CMP  Glucose 70 - 99 mg/dL 110  123  128   BUN 8 - 23 mg/dL 64  51  46   Creatinine 0.61 - 1.24 mg/dL 8.73  7.65  6.95   Sodium 135 - 145 mmol/L 138  135  135   Potassium 3.5 - 5.1 mmol/L 3.7  3.8  3.9   Chloride 98 - 111 mmol/L 99  93  93   CO2 22 - 32 mmol/L '23  24  30   '$ Calcium 8.9 - 10.3 mg/dL 8.9  9.2  9.8   Total Protein 6.5 - 8.1 g/dL  6.0  6.7   Total Bilirubin 0.3 - 1.2 mg/dL  1.7  1.6   Alkaline Phos 38 - 126 U/L  69  96   AST 15 - 41 U/L  12  19   ALT 0 - 44 U/L  13  15      Current Medications, Allergies, Past Medical History, Past Surgical History, Family History and Social History were reviewed in [employer] record.   Review of Systems:   Constitutional: Negative for fever, sweats, chills or weight loss.  Respiratory: Negative for shortness of breath.   Cardiovascular: Negative for chest pain, palpitations and leg swelling.  Gastrointestinal: See HPI.  Musculoskeletal: Negative for back pain or muscle aches.  Neurological: Negative for dizziness, headaches or paresthesias.    Physical Exam: Ht '5\' 11"'$  (1.803 m)   BMI 26.92 kg/m   BP (!) 142/86   Pulse 64   Ht '5\' 11"'$  (1.803 m)   BMI 26.92 kg/m    General: Well developed, w   ***male in no acute distress. Head: Normocephalic and atraumatic. Eyes: No scleral icterus. Conjunctiva pink . Ears: Normal auditory acuity. Mouth: Dentition intact. No ulcers or lesions.  Lungs: Clear throughout to auscultation. Heart: Regular rate and rhythm, no murmur. Abdomen: Soft, nontender and nondistended. No masses or hepatomegaly. Normal  bowel sounds x 4 quadrants.  Rectal: *** Musculoskeletal: Symmetrical with no gross deformities. Extremities: No edema. Neurological: Alert oriented x 4. No focal deficits.  Psychological: Alert and cooperative. Normal mood and affect  Assessment and Recommendations:  Nausea, resolved.  EGD 05/11/2020 was normal.  Gastric empty study 06/08/2020 was normal. -Continue Pantoprazole 40 mg once daily -Continue Compazine 5 mg 1 p.o. twice daily as needed -Patient to call our office if his nausea recurs   Chronic anemia secondary to ESRD on HD. Previously received IV iron.    Atrial flutter s/p ablation 03/2020 on Xarelto. CHA2DS2-VASc Score 4.   Coronary artery disease s/p CABG 2003   Colon cancer screen. CTAP 05/05/2020 without contrast without evidence of colonic mass. Colonoscopy in 2011 reported as normal by the patient.  Does not wish to pursue a conventional screening colonoscopy at this time.  He wishes to follow-up with his PCP for Cologuard testing.

## 2021-12-21 ENCOUNTER — Encounter: Payer: Self-pay | Admitting: Nurse Practitioner

## 2021-12-21 LAB — BASIC METABOLIC PANEL
BUN: 39 mg/dL — ABNORMAL HIGH (ref 6–23)
CO2: 30 mEq/L (ref 19–32)
Calcium: 9.3 mg/dL (ref 8.4–10.5)
Chloride: 94 mEq/L — ABNORMAL LOW (ref 96–112)
Creatinine, Ser: 5.89 mg/dL (ref 0.40–1.50)
GFR: 8.96 mL/min — CL (ref >60.00)
Glucose, Bld: 95 mg/dL (ref 70–99)
Potassium: 3.7 mEq/L (ref 3.5–5.1)
Sodium: 135 mEq/L (ref 135–145)

## 2021-12-24 ENCOUNTER — Ambulatory Visit (INDEPENDENT_AMBULATORY_CARE_PROVIDER_SITE_OTHER): Payer: Medicare Other | Admitting: Physical Therapy

## 2021-12-24 ENCOUNTER — Telehealth: Payer: Self-pay | Admitting: Oncology

## 2021-12-24 ENCOUNTER — Other Ambulatory Visit: Payer: Self-pay

## 2021-12-24 ENCOUNTER — Encounter: Payer: Self-pay | Admitting: Physical Therapy

## 2021-12-24 ENCOUNTER — Telehealth: Payer: Self-pay | Admitting: Nurse Practitioner

## 2021-12-24 DIAGNOSIS — D696 Thrombocytopenia, unspecified: Secondary | ICD-10-CM

## 2021-12-24 DIAGNOSIS — M25662 Stiffness of left knee, not elsewhere classified: Secondary | ICD-10-CM

## 2021-12-24 DIAGNOSIS — N186 End stage renal disease: Secondary | ICD-10-CM

## 2021-12-24 DIAGNOSIS — D649 Anemia, unspecified: Secondary | ICD-10-CM

## 2021-12-24 DIAGNOSIS — R2689 Other abnormalities of gait and mobility: Secondary | ICD-10-CM

## 2021-12-24 DIAGNOSIS — R2681 Unsteadiness on feet: Secondary | ICD-10-CM | POA: Diagnosis not present

## 2021-12-24 DIAGNOSIS — M6281 Muscle weakness (generalized): Secondary | ICD-10-CM | POA: Diagnosis not present

## 2021-12-24 DIAGNOSIS — L98498 Non-pressure chronic ulcer of skin of other sites with other specified severity: Secondary | ICD-10-CM

## 2021-12-24 DIAGNOSIS — R293 Abnormal posture: Secondary | ICD-10-CM | POA: Diagnosis not present

## 2021-12-24 DIAGNOSIS — D72819 Decreased white blood cell count, unspecified: Secondary | ICD-10-CM

## 2021-12-24 DIAGNOSIS — M79605 Pain in left leg: Secondary | ICD-10-CM

## 2021-12-24 NOTE — Telephone Encounter (Signed)
Spoke to pt. Documented under result notes:  Pt verbalized understanding with all questions answered.   

## 2021-12-24 NOTE — Telephone Encounter (Signed)
Scheduled appt per 9/18 referral. Pt is aware of appt date and time. Pt is aware to arrive 15 mins prior to appt time and to bring and updated insurance card. Pt is aware of appt location.

## 2021-12-24 NOTE — Telephone Encounter (Signed)
Patient returned your call. Requesting a call back as soon as possible. Please call to advise.

## 2021-12-24 NOTE — Therapy (Signed)
OUTPATIENT PHYSICAL THERAPY TREATMENT NOTE    Patient Name: [name] MRN: [id number] DOB:Jun 23, 1950, 71 y.o., male Today's Date: 12/24/2021  PCP: Lorenda Hatchet, FNP REFERRING PROVIDER: [name], MD  END OF SESSION:   PT End of Session - 12/24/21 1148     Visit Number 12    Number of Visits 25    Date for PT Re-Evaluation 02/07/22    Authorization Type Medicare and BCBS    Progress Note Due on Visit 63    PT Start Time 1145    PT Stop Time 1229    PT Time Calculation (min) 44 min    Equipment Utilized During Treatment Gait belt    Activity Tolerance Patient limited by fatigue;Patient limited by pain    Behavior During Therapy [facility] for tasks assessed/performed             Past Medical History:  Diagnosis Date   Ambulates with cane    straight cane   Anemia    Cervical myelopathy ([place]) 02/06/2018   Chronic kidney disease    dailysis M [facility] F- home   Complication of anesthesia    Coronary artery disease    Diabetes mellitus without complication ([place])    type2   Diabetic foot ulcer ([place])    Diabetic neuropathy ([place]) 02/06/2018   Gait abnormality 08/24/2018   GERD (gastroesophageal reflux disease)     01/06/20- not current   Gout    History of blood transfusion    History of blood transfusion    History of kidney stones    passed stones   Hypercholesteremia    Hypertension    Hypothyroidism    Neuromuscular disorder ([place])    neuropathy left leg and bilateral feet   Neuropathy    PONV (postoperative nausea and vomiting)    Prostate cancer ([place])    PVD (peripheral vascular disease) ([place])    with amputations   Past Surgical History:  Procedure Laterality Date   A-FLUTTER ABLATION N/A 04/06/2020   Procedure: A-FLUTTER ABLATION;  Surgeon: [name], MD;  Location: [facility];  Service: Cardiovascular;  Laterality: N/A;   AMPUTATION Left 12/25/2013   Procedure: AMPUTATION RAY LEFT 5TH RAY;  Surgeon: [name], MD;  Location: [facility];   Service: Orthopedics;  Laterality: Left;   AMPUTATION Right 12/15/2018   Procedure: AMPUTATION OF 4TH AND 5TH TOES RIGHT FOOT;  Surgeon: [name], MD;  Location: [place];  Service: Orthopedics;  Laterality: Right;   AMPUTATION Left 02/02/2021   Procedure: LEFT FOOT 4TH RAY AMPUTATION;  Surgeon: [name], MD;  Location: [place];  Service: Orthopedics;  Laterality: Left;   AMPUTATION Left 05/09/2021   Procedure: LEFT BELOW KNEE AMPUTATION;  Surgeon: [name], MD;  Location: [place];  Service: Orthopedics;  Laterality: Left;   APPLICATION OF WOUND VAC Right 12/15/2018   Procedure: APPLICATION OF WOUND VAC;  Surgeon: [name], MD;  Location: [place];  Service: Orthopedics;  Laterality: Right;   AV FISTULA PLACEMENT Left 03/25/2019   Procedure: LEFT ARM ARTERIOVENOUS (AV) FISTULA CREATION;  Surgeon: [name], MD;  Location: [place];  Service: Vascular;  Laterality: Left;   [place] Left 05/20/2019   Procedure: SECOND STAGE LEFT BASCILIC VEIN TRANSPOSITION;  Surgeon: [name], MD;  Location: [place];  Service: Vascular;  Laterality: Left;   CARDIAC CATHETERIZATION  02/17/2014   CHOLECYSTECTOMY  COLONOSCOPY  2011   in [place], Normal   CORONARY ARTERY BYPASS GRAFT  2008   CYSTOSCOPY N/A 06/29/2020   Procedure: CYSTOSCOPY WITH CLOT EVACUATION AND  FULGERATION;  Surgeon: [name], MD;  Location: [place];  Service: Urology;  Laterality: N/A;   CYSTOSCOPY WITH FULGERATION Bilateral 06/17/2020   Procedure: CYSTOSCOPY,BILATERAL RETROGRADE, CLOT EVACUATION WITH FULGERATION OF THE BLADDER;  Surgeon: Janith Lima, MD;  Location: [place];  Service: Urology;  Laterality: Bilateral;   CYSTOSCOPY WITH FULGERATION N/A 06/28/2020   Procedure: CYSTOSCOPY WITH CLOT EVACUATION AND FULGERATION OF BLEEDERS;  Surgeon: [name], MD;  Location: [place];  Service: Urology;  Laterality: N/A;  1 HR   I & D EXTREMITY Right 12/15/2018   Procedure:  DEBRIDEMENT RIGHT FOOT;  Surgeon: [name], MD;  Location: [place];  Service: Orthopedics;  Laterality: Right;   I & D EXTREMITY Right 08/27/2019   Procedure: PARTIAL CUBOID EXCISION RIGHT FOOT;  Surgeon: [name], MD;  Location: [place];  Service: Orthopedics;  Laterality: Right;   I & D EXTREMITY Right 01/07/2020   Procedure: RIGHT FOOT EXCISION INFECTED BONE;  Surgeon: [name], MD;  Location: [place];  Service: Orthopedics;  Laterality: Right;   NECK SURGERY     novemver 2019   STUMP REVISION Left 06/27/2021   Procedure: REVISION LEFT BELOW KNEE AMPUTATION;  Surgeon: [name], MD;  Location: [place];  Service: Orthopedics;  Laterality: Left;   TRANSURETHRAL RESECTION OF PROSTATE N/A 06/28/2020   Procedure: TRANSURETHRAL RESECTION OF THE PROSTATE (TURP);  Surgeon: [name], MD;  Location: [place];  Service: Urology;  Laterality: N/A;   WISDOM TOOTH EXTRACTION     Patient Active Problem List   Diagnosis Date Noted   Community acquired pneumonia of left lower lobe of lung 08/18/2021   Paroxysmal atrial fibrillation ([place]) 08/18/2021   Anemia of chronic renal failure 08/18/2021   Thrombocytopenia ([place]) 44/06/4740   Acute metabolic encephalopathy 59/56/3875   Action induced myoclonus 08/18/2021   Dehiscence of amputation stump of left lower extremity ([place])    Left below-knee amputee ([name]) 05/11/2021   S/P BKA (below knee amputation) unilateral, left ([place]) 05/09/2021   Partial nontraumatic amputation of foot, left ([place]) 02/20/2021   Abscess of bursa of left foot 02/03/2021   Cutaneous abscess of left foot    Acute osteomyelitis of metatarsal bone of left foot (HCC)    Symptomatic anemia 09/13/2020   COVID-19 virus infection 09/13/2020   Pressure injury of skin 06/29/2020   Gross hematuria 06/16/2020   Typical atrial flutter ([place]) 03/24/2020   Bilateral pleural effusion 02/24/2020   Healthcare maintenance 01/28/2020   Shortness of breath 01/28/2020   History of  partial ray amputation of fourth toe of right foot ([place]) 09/08/2019   Ulcerated, foot, right, with necrosis of bone (HCC)    Chronic cough 06/25/2019   Cutaneous abscess of right foot    Subacute osteomyelitis of right foot ([place]) 12/14/2018   AKI (acute kidney injury) ([place]) 12/14/2018   ESRD on hemodialysis ([place]) 12/14/2018   Gait abnormality 08/24/2018   Diabetic neuropathy ([place]) 02/06/2018   Cervical myelopathy ([place]) 02/06/2018   Onychomycosis 10/30/2017   Other spondylosis with radiculopathy, cervical region 01/27/2017   Midfoot ulcer, right, limited to breakdown of skin ([place]) 11/15/2016   Lateral epicondylitis, left elbow 08/12/2016   Cellulitis of fifth toe of right foot 07/29/2016   Prostate cancer ([place]) 06/19/2016   Non-pressure chronic ulcer of other part  of right foot limited to breakdown of skin ([place]) 04/04/2016   Cellulitis of leg, right 12/24/2015   Cellulitis of right lower extremity 12/24/2015   Gout 09/14/2012   Hypertension associated with diabetes ([place]) 09/14/2012   Hypothyroidism 09/14/2012   CAD (coronary artery disease) 09/14/2012   Insulin dependent type 2 diabetes mellitus ([place]) 09/14/2012   Hyperlipidemia associated with type 2 diabetes mellitus ([place])    Neuropathy ([place])     REFERRING DIAG: Z00.923R (ICD-10-CM) - Below-knee amputation of left lower extremity  THERAPY DIAG:  Unsteadiness on feet  Other abnormalities of gait and mobility  Abnormal posture  Muscle weakness (generalized)  Pain in left leg  Non-pressure chronic ulcer of skin of other sites with other specified severity (HCC)  Stiffness of left knee, not elsewhere classified  Rationale for Evaluation and Treatment Rehabilitation  PERTINENT HISTORY: braced right foot with cavovarus collapse, CAP without hypoxia, acute metabolic encephalopathy, ESRD on HD Sun/W/F, afib s/p ablation, DM II, neuropathy, HTN, HLD, hypothyroidism, anemia, prostate cancer s/p TUPP. cervical myelopathy, gout,  CAD w/CABG 2005  PRECAUTIONS: no BP in LUE, fall risk   SUBJECTIVE: He presents today with high pain in the residual limb, and has an appointment with the prosthetist on Thursday. He went to the gym this morning and tried walking, and does upper body machines and free weights. His wife reports a new wound on the residual limb. He brought a new rollator to the clinic. He and his wife will see [place] this weekend at the [facility] theater.  PAIN:  Are you having pain?  Yes 5/10 pain in distal lateral shin at rest  Aggravating factor: wearing & weight bearing on prosthesis  Alleviating factor: removing prosthesis   OBJECTIVE: (objective measures completed at initial evaluation unless otherwise dated) COGNITION: Overall cognitive status: Within functional limits for tasks assessed              SENSATION: Not tested   POSTURE: rounded shoulders, forward head, and anterior pelvic tilt - RLE resting position with tibial IR, severe foot supination and inversion with cavovarus collapse  PALPATION 12/17/21 Tenderness to palpation at distal lateral tibia, potential bone spur formation at distal lateral tibia   LOWER EXTREMITY ROM:   ROM (A: AROM, P: PROM) Right eval Left eval  Hip flexion      Hip extension      Hip abduction      Hip adduction      Hip internal rotation      Hip external rotation      Knee flexion      Knee extension   Standing with RW and prosthesis  A: -16*  Ankle dorsiflexion      Ankle plantarflexion      Ankle inversion      Ankle eversion       (Blank rows = not tested)   LOWER EXTREMITY MMT:   MMT Right eval Left eval  Hip flexion      Hip extension      Hip abduction      Hip adduction      Hip internal rotation      Hip external rotation      Knee flexion      Knee extension      Ankle dorsiflexion      Ankle plantarflexion      Ankle inversion      Ankle eversion      (Blank rows = not tested)   Functional strength with standing  and  transfers 3- at hip and knee bil., with no motion at Rt ankle   TRANSFERS: Sit to stand: minA - fell backwards into chair upon first attempt, MinA for next attempt with RW stabilization and verbal cueing for foot placement Stand to sit: minA for controlling descent and stabilization   GAIT: Gait pattern:  adduction RLE with toe-in, step to pattern, decreased step length- Right, decreased stance time- Right, decreased stride length, decreased hip/knee flexion- Left, decreased ankle dorsiflexion- Right, Left hip hike, Right foot flat, knee flexed in stance- Right, knee flexed in stance- Left, lateral hip instability, lateral lean- Right, trunk flexed, and poor foot clearance- Right Distance walked: 35' Assistive device utilized: Environmental consultant - 2 wheeled & Left TTA prosthesis, right double upright AFO custom shoe Level of assistance: MinA to recover losses of balance with straight surface walking, ModA to recover losses of balance turning Lt, MaxA with loss of balance during turning Rt Comments: Rt lower leg positioning reduced foot clearance with catching Lt heel most steps, and promoted Rt lateral leaning/lateral loss of balance. Requires excessive BUE support on RW.   FUNCTIONAL TESTs:  Berg Balance Scale: 6/56, 13/56 with RW   [facility] PT Assessment - 11/13/21 0001                Standardized Balance Assessment    Standardized Balance Assessment Berg Balance Test          Berg Balance Test    Sit to Stand Needs moderate or maximal assist to stand   Berg task with RW = 1    Standing Unsupported Unable to stand 30 seconds unassisted   Berg task with RW = 2    Sitting with Back Unsupported but Feet Supported on Floor or Stool Able to sit safely and securely 2 minutes     Stand to Sit Needs assistance to sit   Berg task with RW = 2    Transfers Able to transfer with verbal cueing and /or supervision     Standing Unsupported with Eyes Closed Needs help to keep from falling   Berg task with RW = 0     Standing Unsupported with Feet Together Needs help to attain position and unable to hold for 15 seconds   Berg task with RW = 0    From Standing, Reach Forward with Outstretched Arm Loses balance while trying/requires external support   Berg task with RW = 1 (CGA reaching with Lt)    From Standing Position, Pick up Object from Floor Unable to try/needs assist to keep balance   Berg task with RW = 0    From Standing Position, Turn to Look Behind Over each Shoulder Needs assist to keep from losing balance and falling   Berg task with RW = 0    Turn 360 Degrees Needs assistance while turning   Berg task with RW = 0    Standing Unsupported, Alternately Place Feet on Step/Stool Needs assistance to keep from falling or unable to try   Berg task with RW = 0    Standing Unsupported, One Foot in [employer] balance while stepping or standing   Berg task with RW = 0    Standing on One Leg Unable to try or needs assist to prevent fall   Berg task with RW = 1    Total Score 6     Berg comment: Berg tasks with RW = 13/56  CURRENT PROSTHETIC WEAR ASSESSMENT: Patient is dependent with: skin check, residual limb care, care of non-amputated limb, prosthetic cleaning, ply sock cleaning, correct ply sock adjustment, proper wear schedule/adjustment, and proper weight-bearing schedule/adjustment Donning prosthesis: Min A and maximal cueing Doffing prosthesis: SBA Prosthetic wear tolerance: 30 minutes 1-2x/day 6 of 6 days since delivery Prosthetic weight bearing tolerance: stood ~5 minutes with c/o tibial crest pain moderate level after weight bearing Edema: present with slow capillary refill Residual limb condition: 1.6 cm scab along incision, cylinderical shape, normal color & temperature, mildly dry skin, slight scar adhesion at distal tibia,  Prosthetic description: silicone liner with pin lock suspension, 2nd pelite foam liner, total contact socket, SACH foot     TODAY'S TREATMENT:   12/24/2021 Prosthetic Training with left Transtibial Prosthesis & right double upright AFO: PT advised not using gel on residual limb and using baby oil or nothing, pt and wife verbalized understanding Wound on lateral residual limb is 53mm across, circular and red PT demonstrated application of Tegaderm for wound management with instruction for use during prosthesis wear time, pt and wife verbalized understanding with application and use PT demonstrated donning of liner with lining up circle to bottom of limb and limiting wrinkling in posterior knee, pt verbalized understanding and returned demo PT suggest prosthetist reduce pre-tibial pads esp on lateral limb and add a thin gastroc pad to posterior socket, pt and wife verbalized understanding and asked if suggestion could be referred to prosthetist PT adjusted height of rollator and added visual cue for RLE step width Pt amb 30' SBA with rollator, ascend and descend curb with minA and significant cueing for technique. He entered & exited PT clinic with rollator with wife's supervision.   His wife requested weigh rollator which was 19# PT demo ascend and descend curb and ramp with pt videoing demo, instructed to watch videos >/= 3x daily for retention of technique Pt sit to chair without armrests with significant cueing for technique with locking brakes and single UE reach, pt verbalized understanding  12/20/2021 Prosthetic Training with left Transtibial Prosthesis & right double upright AFO: Pt amb 80 feet X 2, 40 feet X2 with rollator with CGA to SBA following PT demo of use, required visual cues for step width and verbal cues for step within walker  PT demo & verbal cues on ascend and descend curb with rollator, pt returned demo x 1 with minA and verbal cues for technique Therex: Leg press machine DL 75# 2X10, then SL 25# 2X10 bilat Nu step L5 X 8  min with rest breaks PRN.  12/17/2021 Prosthetic Training with left Transtibial Prosthesis &  right double upright AFO: PT instructed in proper donning of liner to prevent pulling over tibia, pt verbalized understanding PT educated on wear time of liner with nightly break to prevent skin breakdown, pt and wife verbalized understanding Pt referred to discuss distal shin pain with prosthetist at next appointment for possible socket revision to comfort, pt and wife verbalized understanding. See objective for palpation related to pain.   Pt amb 80', 15', 71' with rollator with CGA to SBA following PT demo of use, required visual cues for step width and verbal cues for step within walker  Pt STS rollator against wall x 2 with verbal cues for technique, locking brakes with min guard.  PT demo & verbal cues on ascend and descend curb with rollator, pt returned demo x 1 with minA and verbal cues for technique  12/13/2021 Prosthetic Training with left Transtibial  Prosthesis & right double upright AFO: PT education on sock ply adjustments, he can have difficulty with this due to color blindness Sit to/from stand from w/c with armrests and chair without armrests to RW with SBA, demonstrated decreased control of descent to chair without armrests Pt amb 100' with RW and visual cues for step placement weaving around tall obstacles with SBA. Then another 100 feet with obstacles to navigate around.  Pt amb 6' to and from counter with RW to side step 8' Rt and Lt x 3 ea. with SBA/CGA following PT demo of weight shift and pushing to step, pt required cueing for upright posture PT demo & verbal cues on ascend and descend stairs with one handrail and with side stepping technique for 5 steps. He needed cues to allow room for trail leg to descend and place down on step.  Leg press machine DL 75# X 20, then SL 25# X 20 bilat Pt amb one more round of 75 feet with RW and SBA, with some carryover of wider step length to visual targets on RW.   HOME EXERCISE PROGRAM:   11/22/21 1244  PT Education  Education  Details HEP at sink  Person(s) Educated Patient;Spouse  Methods Explanation;Demonstration;Tactile cues;Verbal cues;Handout  Comprehension Verbalized understanding;Returned demonstration;Verbal cues required;Tactile cues required;Need further instruction    Do each exercise 1-2  times per day Do each exercise 5-10 repetitions Hold each exercise for 2 seconds to feel your location  AT [place].  Try to find this position when standing still for activities.   USE TAPE ON FLOOR TO MARK THE MIDLINE POSITION which is even with middle of sink.  You also should try to feel with your limb pressure in socket.  You are trying to feel with limb what you used to feel with the bottom of your foot.  Side to Side Shift: Moving your hips only (not shoulders): move weight onto your left leg, HOLD/FEEL pressure in socket.  Move back to equal weight on each leg, HOLD/FEEL pressure in socket. Move weight onto your right leg, HOLD/FEEL pressure in socket. Move back to equal weight on each leg, HOLD/FEEL pressure in socket. Repeat.  Start with both hands on sink, then right hand only, then left hand only Front to Back Shift: Moving your hips only (not shoulders): move your weight forward onto your toes, HOLD/FEEL pressure in socket. Move your weight back to equal Flat Foot on both legs, HOLD/FEEL  pressure in socket. Move your weight back onto your heels, HOLD/FEEL  pressure in socket. Move your weight back to equal on both legs, HOLD/FEEL  pressure in socket. Repeat.  Start with both hands on sink, then right hand only, then left hand only  Moving Cones / Cups: With equal weight on each leg: Hold on with one hand the first time, then progress to no hand supports. Move cups from one side of sink to the other. Place cups ~2" out of your reach, progress to 10" beyond reach.  Place one hand in middle of sink and reach with other hand. Do both arms.  Then  hover one hand and move cups with other hand.  Overhead/Upward Reaching: alternated reaching up to top cabinets or ceiling if no cabinets present. Keep equal weight on each leg. Start with one hand support on counter while other hand reaches and progress to no hand support with reaching.  ace one hand in middle of sink  and reach with other hand. Do both arms.   Looking Over Shoulders: With equal weight on each leg: alternate turning to look over your shoulders with one hand support on counter as needed.  Start with head motions only to look in front of shoulder, then even with shoulder and progress to looking behind you. To look to side, move head /eyes, then shoulder on side looking pulls back, shift more weight to side looking and pull hip back. Place one hand in middle of sink and let go with other hand so your shoulder can pull back. Switch hands to look other way. If looking right, use left hand at sink. If looking left, use right hand at sink. 6.  Stepping with leg tapping forefoot into lower cabinet:  Move items under cabinet out of your way. Shift your hips/pelvis so weight on right side. Tighten muscles in hip on right side.  SLOWLY step left leg so front of foot is in cabinet. Then step back to floor.  Repeat with other leg    ASSESSMENT:   CLINICAL IMPRESSION: He continues to be limited by significant residual limb pain, and has a new wound at distal lateral limb within painful area. He has an appointment with the prosthetist on Thursday with PT suggestion to reduce front padding and add thin pad to posterior socket. Curb negotiation and STS from rollator to chair needed significant cueing for technique and safety, and curb and ramp PT demo was videoed with pt phone with suggestion to watch multiple times per day to encourage retention of technique. He continues to benefit from skilled PT to address prosthetic management and functional limitations.   OBJECTIVE IMPAIRMENTS Abnormal gait,  decreased activity tolerance, decreased balance, decreased coordination, decreased endurance, decreased knowledge of use of DME, decreased mobility, difficulty walking, decreased ROM, decreased strength, decreased safety awareness, increased edema, impaired flexibility, postural dysfunction, prosthetic dependency , and pain.    ACTIVITY LIMITATIONS bending, sitting, standing, squatting, stairs, transfers, bathing, dressing, hygiene/grooming, locomotion level, and prosthetic use   PARTICIPATION LIMITATIONS: cleaning, laundry, and community activity   PERSONAL FACTORS Fitness, Time since onset of injury/illness/exacerbation, and 3+ comorbidities: see PMH  are also affecting patient's functional outcome.    REHAB POTENTIAL: Good   CLINICAL DECISION MAKING: Evolving/moderate complexity   EVALUATION COMPLEXITY: Moderate     GOALS: Goals reviewed with patient? Yes   SHORT TERM GOALS: Target date: 12/14/21   Patient independent with donning/doffing prosthesis. Baseline: see objective data Goal status: partially met 12/17/2021   2.  Patient verbalize proper assessment of wound with prosthetic use.  Baseline: see objective data Goal status: met 12/11/21   3.  Patient wears prosthesis >/= 8 hours/day daily without increase in wound & limb pain </= 5/10 to facilitate functional mobility and safety with transfers. Baseline: see objective data Goal status: met 12/05/21   4.  Patient able to transfer sit to/from stand chair with armrests with RW with supervision. Baseline: see objective data Goal status: met 12/05/21   5.  Patient ambulates 76' with RW & prosthesis / AFO with minA including negotiating around obstacles.  Baseline: see objective data Goal status: met 12/11/21 with SBA  UPDATED SHORT TERM GOALS: Target date: 01/11/22  Patient independent with donning/doffing prosthesis. Baseline: see objective data Goal status: ongoing 12/24/21  Patient able to transfer sit to/from stand chair  without armrests with RW with supervision. Baseline: see objective data Goal status: ongoing 12/24/21  Patient ambulates 100' with rollator & prosthesis / AFO with  supervision including ramp and curb. Baseline: see objective data Goal status: ongoing 12/24/21  LONG TERM GOALS: Target date: 02/07/22   Patient wears prosthesis > 90% of waking hours with no limb pain or skin issues to promote function and safety. Baseline: see objective data Goal status: INITIAL   2.  Patient independent with all aspects of prosthetic care to enable safe utilization of prosthesis. Baseline: see objective data Goal status: INITIAL   3.  Patient ambulates 41' with LRAD safely and modified independent to allow for household mobility. Baseline: see objective data Goal status: INITIAL   4.  Patient able to ambulate 200' and navigate ramp and curb with LRAD safely with minA/ caregiver assist to allow for community mobility. Baseline: see objective data Goal status: INITIAL   5.  Berg balance score >/= 30/56 with RW to indicate low fall risk during household level balance requirements with UE support. Baseline: see objective data Goal status: INITIAL     PLAN: PT FREQUENCY: 2x/week   PT DURATION: other: 90 days   PLANNED INTERVENTIONS: Therapeutic exercises, Therapeutic activity, Neuromuscular re-education, Balance training, Gait training, Patient/Family education, Self Care, Stair training, Vestibular training, Orthotic/Fit training, Prosthetic training, DME instructions, Moist heat, scar mobilization, Manual therapy, Re-evaluation, and physical performance tests   PLAN FOR NEXT SESSION: Assess residual limb wound. ramp and curb with rollator, STS to chair without armrests. Standing balance activities.  Jana Hakim, Student-PT 12/24/2021, 12:43 PM  This entire session of physical therapy was performed under the direct supervision of PT signing evaluation /treatment. PT reviewed note and  agrees.  Jamey Reas, PT, DPT 12/24/2021, 1:05 PM

## 2021-12-26 ENCOUNTER — Ambulatory Visit (INDEPENDENT_AMBULATORY_CARE_PROVIDER_SITE_OTHER): Payer: Medicare Other | Admitting: Physical Therapy

## 2021-12-26 ENCOUNTER — Ambulatory Visit (HOSPITAL_COMMUNITY)
Admission: RE | Admit: 2021-12-26 | Discharge: 2021-12-26 | Disposition: A | Discharge status: Home / Self Care | Payer: Medicare Other | Source: Ambulatory Visit | Attending: Nurse Practitioner | Admitting: Nurse Practitioner

## 2021-12-26 ENCOUNTER — Encounter: Payer: Self-pay | Admitting: Physical Therapy

## 2021-12-26 DIAGNOSIS — R2689 Other abnormalities of gait and mobility: Secondary | ICD-10-CM

## 2021-12-26 DIAGNOSIS — D696 Thrombocytopenia, unspecified: Secondary | ICD-10-CM | POA: Insufficient documentation

## 2021-12-26 DIAGNOSIS — R293 Abnormal posture: Secondary | ICD-10-CM | POA: Diagnosis not present

## 2021-12-26 DIAGNOSIS — R110 Nausea: Secondary | ICD-10-CM | POA: Insufficient documentation

## 2021-12-26 DIAGNOSIS — R2681 Unsteadiness on feet: Secondary | ICD-10-CM | POA: Diagnosis not present

## 2021-12-26 DIAGNOSIS — M6281 Muscle weakness (generalized): Secondary | ICD-10-CM | POA: Diagnosis not present

## 2021-12-26 DIAGNOSIS — R630 Anorexia: Secondary | ICD-10-CM | POA: Diagnosis present

## 2021-12-26 DIAGNOSIS — N186 End stage renal disease: Secondary | ICD-10-CM | POA: Diagnosis present

## 2021-12-26 DIAGNOSIS — M79605 Pain in left leg: Secondary | ICD-10-CM

## 2021-12-26 DIAGNOSIS — M25662 Stiffness of left knee, not elsewhere classified: Secondary | ICD-10-CM

## 2021-12-26 DIAGNOSIS — L98498 Non-pressure chronic ulcer of skin of other sites with other specified severity: Secondary | ICD-10-CM

## 2021-12-26 NOTE — Therapy (Signed)
OUTPATIENT PHYSICAL THERAPY TREATMENT NOTE    Patient Name: [name] MRN: [id number] DOB:[birth year], 71 y.o., male Today's Date: 12/26/2021  PCP: Lorenda Hatchet, FNP REFERRING PROVIDER: [name], MD  END OF SESSION:   PT End of Session - 12/26/21 1501     Visit Number 13    Number of Visits 25    Date for PT Re-Evaluation 02/07/22    Authorization Type Medicare and BCBS    Progress Note Due on Visit 20    PT Start Time 1430    PT Stop Time 1515    PT Time Calculation (min) 45 min    Equipment Utilized During Treatment Gait belt    Activity Tolerance Patient limited by fatigue;Patient limited by pain    Behavior During Therapy [facility] for tasks assessed/performed             Past Medical History:  Diagnosis Date   Ambulates with cane    straight cane   Anemia    Cervical myelopathy ([place]) 02/06/2018   Chronic kidney disease    dailysis M W F- home   Complication of anesthesia    Coronary artery disease    Diabetes mellitus without complication ([place])    type2   Diabetic foot ulcer ([place])    Diabetic neuropathy ([place]) 02/06/2018   Gait abnormality 08/24/2018   GERD (gastroesophageal reflux disease)     01/06/20- not current   Gout    History of blood transfusion    History of blood transfusion    History of kidney stones    passed stones   Hypercholesteremia    Hypertension    Hypothyroidism    Neuromuscular disorder ([place])    neuropathy left leg and bilateral feet   Neuropathy    PONV (postoperative nausea and vomiting)    Prostate cancer ([place])    PVD (peripheral vascular disease) ([place])    with amputations   Past Surgical History:  Procedure Laterality Date   A-FLUTTER ABLATION N/A 04/06/2020   Procedure: A-FLUTTER ABLATION;  Surgeon: [name], MD;  Location: [place] [facility];  Service: Cardiovascular;  Laterality: N/A;   AMPUTATION Left 12/25/2013   Procedure: AMPUTATION RAY LEFT 5TH RAY;  Surgeon: [name], MD;  Location: [facility];   Service: Orthopedics;  Laterality: Left;   AMPUTATION Right 12/15/2018   Procedure: AMPUTATION OF 4TH AND 5TH TOES RIGHT FOOT;  Surgeon: [name], MD;  Location: [place];  Service: Orthopedics;  Laterality: Right;   AMPUTATION Left 02/02/2021   Procedure: LEFT FOOT 4TH RAY AMPUTATION;  Surgeon: [name], MD;  Location: [place];  Service: Orthopedics;  Laterality: Left;   AMPUTATION Left 05/09/2021   Procedure: LEFT BELOW KNEE AMPUTATION;  Surgeon: [name], MD;  Location: [place];  Service: Orthopedics;  Laterality: Left;   APPLICATION OF WOUND VAC Right 12/15/2018   Procedure: APPLICATION OF WOUND VAC;  Surgeon: [name], MD;  Location: [place];  Service: Orthopedics;  Laterality: Right;   AV FISTULA PLACEMENT Left 03/25/2019   Procedure: LEFT ARM ARTERIOVENOUS (AV) FISTULA CREATION;  Surgeon: [name], MD;  Location: [place];  Service: Vascular;  Laterality: Left;   [place] Left 05/20/2019   Procedure: SECOND STAGE LEFT BASCILIC VEIN TRANSPOSITION;  Surgeon: [name], MD;  Location: [name];  Service: Vascular;  Laterality: Left;   CARDIAC CATHETERIZATION  02/17/2014   CHOLECYSTECTOMY  COLONOSCOPY  2011   in [place], Normal   CORONARY ARTERY BYPASS GRAFT  2008   CYSTOSCOPY N/A 06/29/2020   Procedure: CYSTOSCOPY WITH CLOT EVACUATION AND  FULGERATION;  Surgeon: [name], MD;  Location: [place];  Service: Urology;  Laterality: N/A;   CYSTOSCOPY WITH FULGERATION Bilateral 06/17/2020   Procedure: CYSTOSCOPY,BILATERAL RETROGRADE, CLOT EVACUATION WITH FULGERATION OF THE BLADDER;  Surgeon: Janith Lima, MD;  Location: [place];  Service: Urology;  Laterality: Bilateral;   CYSTOSCOPY WITH FULGERATION N/A 06/28/2020   Procedure: CYSTOSCOPY WITH CLOT EVACUATION AND FULGERATION OF BLEEDERS;  Surgeon: [name], MD;  Location: [place];  Service: Urology;  Laterality: N/A;  1 HR   I & D EXTREMITY Right 12/15/2018   Procedure:  DEBRIDEMENT RIGHT FOOT;  Surgeon: [name], MD;  Location: [place];  Service: Orthopedics;  Laterality: Right;   I & D EXTREMITY Right 08/27/2019   Procedure: PARTIAL CUBOID EXCISION RIGHT FOOT;  Surgeon: [name], MD;  Location: [place];  Service: Orthopedics;  Laterality: Right;   I & D EXTREMITY Right 01/07/2020   Procedure: RIGHT FOOT EXCISION INFECTED BONE;  Surgeon: [name], MD;  Location: [place];  Service: Orthopedics;  Laterality: Right;   NECK SURGERY     novemver 2019   STUMP REVISION Left 06/27/2021   Procedure: REVISION LEFT BELOW KNEE AMPUTATION;  Surgeon: [name], MD;  Location: [place];  Service: Orthopedics;  Laterality: Left;   TRANSURETHRAL RESECTION OF PROSTATE N/A 06/28/2020   Procedure: TRANSURETHRAL RESECTION OF THE PROSTATE (TURP);  Surgeon: [name], MD;  Location: [place];  Service: Urology;  Laterality: N/A;   WISDOM TOOTH EXTRACTION     Patient Active Problem List   Diagnosis Date Noted   Community acquired pneumonia of left lower lobe of lung 08/18/2021   Paroxysmal atrial fibrillation ([place]) 08/18/2021   Anemia of chronic renal failure 08/18/2021   Thrombocytopenia ([place]) 44/06/4740   Acute metabolic encephalopathy 59/56/3875   Action induced myoclonus 08/18/2021   Dehiscence of amputation stump of left lower extremity ([place])    Left below-knee amputee ([name]) 05/11/2021   S/P BKA (below knee amputation) unilateral, left ([place]) 05/09/2021   Partial nontraumatic amputation of foot, left ([place]) 02/20/2021   Abscess of bursa of left foot 02/03/2021   Cutaneous abscess of left foot    Acute osteomyelitis of metatarsal bone of left foot (HCC)    Symptomatic anemia 09/13/2020   COVID-19 virus infection 09/13/2020   Pressure injury of skin 06/29/2020   Gross hematuria 06/16/2020   Typical atrial flutter ([place]) 03/24/2020   Bilateral pleural effusion 02/24/2020   Healthcare maintenance 01/28/2020   Shortness of breath 01/28/2020   History of  partial ray amputation of fourth toe of right foot ([place]) 09/08/2019   Ulcerated, foot, right, with necrosis of bone (HCC)    Chronic cough 06/25/2019   Cutaneous abscess of right foot    Subacute osteomyelitis of right foot ([place]) 12/14/2018   AKI (acute kidney injury) ([place]) 12/14/2018   ESRD on hemodialysis ([place]) 12/14/2018   Gait abnormality 08/24/2018   Diabetic neuropathy ([place]) 02/06/2018   Cervical myelopathy ([place]) 02/06/2018   Onychomycosis 10/30/2017   Other spondylosis with radiculopathy, cervical region 01/27/2017   Midfoot ulcer, right, limited to breakdown of skin ([place]) 11/15/2016   Lateral epicondylitis, left elbow 08/12/2016   Cellulitis of fifth toe of right foot 07/29/2016   Prostate cancer ([place]) 06/19/2016   Non-pressure chronic ulcer of other part  of right foot limited to breakdown of skin ([place]) 04/04/2016   Cellulitis of leg, right 12/24/2015   Cellulitis of right lower extremity 12/24/2015   Gout 09/14/2012   Hypertension associated with diabetes ([place]) 09/14/2012   Hypothyroidism 09/14/2012   CAD (coronary artery disease) 09/14/2012   Insulin dependent type 2 diabetes mellitus ([name]) 09/14/2012   Hyperlipidemia associated with type 2 diabetes mellitus ([place])    Neuropathy ([place])     REFERRING DIAG: E26.834H (ICD-10-CM) - Below-knee amputation of left lower extremity  THERAPY DIAG:  Unsteadiness on feet  Other abnormalities of gait and mobility  Abnormal posture  Muscle weakness (generalized)  Pain in left leg  Non-pressure chronic ulcer of skin of other sites with other specified severity (HCC)  Stiffness of left knee, not elsewhere classified  Rationale for Evaluation and Treatment Rehabilitation  PERTINENT HISTORY: braced right foot with cavovarus collapse, CAP without hypoxia, acute metabolic encephalopathy, ESRD on HD Sun/W/F, afib s/p ablation, DM II, neuropathy, HTN, HLD, hypothyroidism, anemia, prostate cancer s/p TUPP. cervical myelopathy, gout,  CAD w/CABG 2005  PRECAUTIONS: no BP in LUE, fall risk   SUBJECTIVE: He continues to have pain in residual limb, with especially high amounts yesterday. He will see prosthetist tomorrow morning. He stopped using the gel on the limb and has been using tegaderm over the wound. Abdominal scan found fluid around liver. He watched the recorded video 1 time. He and his wife will see [place] at [facility] this weekend.  PAIN:  Are you having pain?  Yes 5/10 pain in distal lateral shin at rest  Aggravating factor: wearing & weight bearing on prosthesis  Alleviating factor: removing prosthesis   OBJECTIVE: (objective measures completed at initial evaluation unless otherwise dated) COGNITION: Overall cognitive status: Within functional limits for tasks assessed              SENSATION: Not tested   POSTURE: rounded shoulders, forward head, and anterior pelvic tilt - RLE resting position with tibial IR, severe foot supination and inversion with cavovarus collapse  PALPATION 12/17/21 Tenderness to palpation at distal lateral tibia, potential bone spur formation at distal lateral tibia   LOWER EXTREMITY ROM:   ROM (A: AROM, P: PROM) Right eval Left eval  Hip flexion      Hip extension      Hip abduction      Hip adduction      Hip internal rotation      Hip external rotation      Knee flexion      Knee extension   Standing with RW and prosthesis  A: -16*  Ankle dorsiflexion      Ankle plantarflexion      Ankle inversion      Ankle eversion       (Blank rows = not tested)   LOWER EXTREMITY MMT:   MMT Right eval Left eval  Hip flexion      Hip extension      Hip abduction      Hip adduction      Hip internal rotation      Hip external rotation      Knee flexion      Knee extension      Ankle dorsiflexion      Ankle plantarflexion      Ankle inversion      Ankle eversion      (Blank rows = not tested)   Functional strength with standing and transfers 3- at hip and  knee  bil., with no motion at Rt ankle   TRANSFERS: Sit to stand: minA - fell backwards into chair upon first attempt, MinA for next attempt with RW stabilization and verbal cueing for foot placement Stand to sit: minA for controlling descent and stabilization   GAIT: Gait pattern:  adduction RLE with toe-in, step to pattern, decreased step length- Right, decreased stance time- Right, decreased stride length, decreased hip/knee flexion- Left, decreased ankle dorsiflexion- Right, Left hip hike, Right foot flat, knee flexed in stance- Right, knee flexed in stance- Left, lateral hip instability, lateral lean- Right, trunk flexed, and poor foot clearance- Right Distance walked: 35' Assistive device utilized: Environmental consultant - 2 wheeled & Left TTA prosthesis, right double upright AFO custom shoe Level of assistance: MinA to recover losses of balance with straight surface walking, ModA to recover losses of balance turning Lt, MaxA with loss of balance during turning Rt Comments: Rt lower leg positioning reduced foot clearance with catching Lt heel most steps, and promoted Rt lateral leaning/lateral loss of balance. Requires excessive BUE support on RW.   FUNCTIONAL TESTs:  Berg Balance Scale: 6/56, 13/56 with RW   [facility] PT Assessment - 11/13/21 0001                Standardized Balance Assessment    Standardized Balance Assessment Berg Balance Test          Berg Balance Test    Sit to Stand Needs moderate or maximal assist to stand   Berg task with RW = 1    Standing Unsupported Unable to stand 30 seconds unassisted   Berg task with RW = 2    Sitting with Back Unsupported but Feet Supported on Floor or Stool Able to sit safely and securely 2 minutes     Stand to Sit Needs assistance to sit   Berg task with RW = 2    Transfers Able to transfer with verbal cueing and /or supervision     Standing Unsupported with Eyes Closed Needs help to keep from falling   Berg task with RW = 0    Standing Unsupported with  Feet Together Needs help to attain position and unable to hold for 15 seconds   Berg task with RW = 0    From Standing, Reach Forward with Outstretched Arm Loses balance while trying/requires external support   Berg task with RW = 1 (CGA reaching with Lt)    From Standing Position, Pick up Object from Floor Unable to try/needs assist to keep balance   Berg task with RW = 0    From Standing Position, Turn to Look Behind Over each Shoulder Needs assist to keep from losing balance and falling   Berg task with RW = 0    Turn 360 Degrees Needs assistance while turning   Berg task with RW = 0    Standing Unsupported, Alternately Place Feet on Step/Stool Needs assistance to keep from falling or unable to try   Berg task with RW = 0    Standing Unsupported, One Foot in [employer] balance while stepping or standing   Berg task with RW = 0    Standing on One Leg Unable to try or needs assist to prevent fall   Berg task with RW = 1    Total Score 6     Berg comment: Berg tasks with RW = 13/56  CURRENT PROSTHETIC WEAR ASSESSMENT: Patient is dependent with: skin check, residual limb care, care of non-amputated limb, prosthetic cleaning, ply sock cleaning, correct ply sock adjustment, proper wear schedule/adjustment, and proper weight-bearing schedule/adjustment Donning prosthesis: Min A and maximal cueing Doffing prosthesis: SBA Prosthetic wear tolerance: 30 minutes 1-2x/day 6 of 6 days since delivery Prosthetic weight bearing tolerance: stood ~5 minutes with c/o tibial crest pain moderate level after weight bearing Edema: present with slow capillary refill Residual limb condition: 1.6 cm scab along incision, cylinderical shape, normal color & temperature, mildly dry skin, slight scar adhesion at distal tibia,  Prosthetic description: silicone liner with pin lock suspension, 2nd pelite foam liner, total contact socket, SACH foot     TODAY'S TREATMENT:  12/26/2021 Prosthetic  Training with left Transtibial Prosthesis & right double upright AFO: Wound on lateral residual limb is 82m across and lightly red, covered in tegaderm. PT advised use of w/c and RW during [facility] to fatigue Pt amb 100' into clinic with rollator and SBA, amb with significant shoulder shrug Pt required cueing for STS to chair with armrests x 1 and without armrests x 3 with rollator to lock brakes and reach posteriorly to chair for descent, scooting forward in chair for ascent PT educated on likelihood for increased fatigue with hematology concerns and impact on depressive sx, pt and wife verbalized understanding Pt ascend and descend ramp and curb with SBA Pt turn to sit and stand in rollator with SBA and slight movement of rollator during STS  Neuro Re-ed: Feet hip width apart eyes open x 1 min frequent UE support and ranges 1-5 sec standing without UEs Modified tandem stance eyes open LLE posterior 2 x 30s with frequent UE support, increased limb pain with LLE anterior Feet hip width apart eyes closed x 30s with fingertip UE support, some increased sway noted   12/24/2021 Prosthetic Training with left Transtibial Prosthesis & right double upright AFO: PT advised not using gel on residual limb and using baby oil or nothing, pt and wife verbalized understanding Wound on lateral residual limb is [place], circular and red PT demonstrated application of Tegaderm for wound management with instruction for use during prosthesis wear time, pt and wife verbalized understanding with application and use PT demonstrated donning of liner with lining up circle to bottom of limb and limiting wrinkling in posterior knee, pt verbalized understanding and returned demo PT suggest prosthetist reduce pre-tibial pads esp on lateral limb and add a thin gastroc pad to posterior socket, pt and wife verbalized understanding and asked if suggestion could be referred to prosthetist PT adjusted height  of rollator and added visual cue for RLE step width Pt amb 30' SBA with rollator, ascend and descend curb with minA and significant cueing for technique. He entered & exited PT clinic with rollator with wife's supervision.   His wife requested weigh rollator which was 19# PT demo ascend and descend curb and ramp with pt videoing demo, instructed to watch videos >/= 3x daily for retention of technique Pt sit to chair without armrests with significant cueing for technique with locking brakes and single UE reach, pt verbalized understanding  12/20/2021 Prosthetic Training with left Transtibial Prosthesis & right double upright AFO: Pt amb 80 feet X 2, 40 feet X2 with rollator with CGA to SBA following PT demo of use, required visual cues for step width and verbal cues for step within walker  PT demo & verbal cues on ascend and descend curb  with rollator, pt returned demo x 1 with minA and verbal cues for technique Therex: Leg press machine DL 75# 2X10, then SL 25# 2X10 bilat Nu step L5 X 8  min with rest breaks PRN.  12/17/2021 Prosthetic Training with left Transtibial Prosthesis & right double upright AFO: PT instructed in proper donning of liner to prevent pulling over tibia, pt verbalized understanding PT educated on wear time of liner with nightly break to prevent skin breakdown, pt and wife verbalized understanding Pt referred to discuss distal shin pain with prosthetist at next appointment for possible socket revision to comfort, pt and wife verbalized understanding. See objective for palpation related to pain.   Pt amb 80', 15', 80' with rollator with CGA to SBA following PT demo of use, required visual cues for step width and verbal cues for step within walker  Pt STS rollator against wall x 2 with verbal cues for technique, locking brakes with min guard.  PT demo & verbal cues on ascend and descend curb with rollator, pt returned demo x 1 with minA and verbal cues for technique   HOME  EXERCISE PROGRAM:   11/22/21 1244  PT Education  Education Details HEP at sink  Person(s) Educated Patient;Spouse  Methods Explanation;Demonstration;Tactile cues;Verbal cues;Handout  Comprehension Verbalized understanding;Returned demonstration;Verbal cues required;Tactile cues required;Need further instruction    Do each exercise 1-2  times per day Do each exercise 5-10 repetitions Hold each exercise for 2 seconds to feel your location  AT [place].  Try to find this position when standing still for activities.   USE TAPE ON FLOOR TO MARK THE MIDLINE POSITION which is even with middle of sink.  You also should try to feel with your limb pressure in socket.  You are trying to feel with limb what you used to feel with the bottom of your foot.  Side to Side Shift: Moving your hips only (not shoulders): move weight onto your left leg, HOLD/FEEL pressure in socket.  Move back to equal weight on each leg, HOLD/FEEL pressure in socket. Move weight onto your right leg, HOLD/FEEL pressure in socket. Move back to equal weight on each leg, HOLD/FEEL pressure in socket. Repeat.  Start with both hands on sink, then right hand only, then left hand only Front to Back Shift: Moving your hips only (not shoulders): move your weight forward onto your toes, HOLD/FEEL pressure in socket. Move your weight back to equal Flat Foot on both legs, HOLD/FEEL  pressure in socket. Move your weight back onto your heels, HOLD/FEEL  pressure in socket. Move your weight back to equal on both legs, HOLD/FEEL  pressure in socket. Repeat.  Start with both hands on sink, then right hand only, then left hand only  Moving Cones / Cups: With equal weight on each leg: Hold on with one hand the first time, then progress to no hand supports. Move cups from one side of sink to the other. Place cups ~2" out of your reach, progress to 10" beyond reach.  Place one hand in  middle of sink and reach with other hand. Do both arms.  Then hover one hand and move cups with other hand.  Overhead/Upward Reaching: alternated reaching up to top cabinets or ceiling if no cabinets present. Keep equal weight on each leg. Start with one hand support on counter while other hand reaches and progress to no hand support with reaching.  ace one hand in  middle of sink and reach with other hand. Do both arms.   Looking Over Shoulders: With equal weight on each leg: alternate turning to look over your shoulders with one hand support on counter as needed.  Start with head motions only to look in front of shoulder, then even with shoulder and progress to looking behind you. To look to side, move head /eyes, then shoulder on side looking pulls back, shift more weight to side looking and pull hip back. Place one hand in middle of sink and let go with other hand so your shoulder can pull back. Switch hands to look other way. If looking right, use left hand at sink. If looking left, use right hand at sink. 6.  Stepping with leg tapping forefoot into lower cabinet:  Move items under cabinet out of your way. Shift your hips/pelvis so weight on right side. Tighten muscles in hip on right side.  SLOWLY step left leg so front of foot is in cabinet. Then step back to floor.  Repeat with other leg    ASSESSMENT:   CLINICAL IMPRESSION: He feels very limited functionally by significant residual limb pain and currently requires frequent seated breaks in session due to pain before limited by fatigue. He sees the prosthetist tomorrow and will have some modifications to socket that may decrease resting and WB pain levels. He requires significant cueing for STS but is able to ambulate and ascend/descend curb without cueing for technique today. He was unable to complete ramp negotiation due to pain. He continues to benefit from skilled PT.   OBJECTIVE IMPAIRMENTS Abnormal gait, decreased activity tolerance,  decreased balance, decreased coordination, decreased endurance, decreased knowledge of use of DME, decreased mobility, difficulty walking, decreased ROM, decreased strength, decreased safety awareness, increased edema, impaired flexibility, postural dysfunction, prosthetic dependency , and pain.    ACTIVITY LIMITATIONS bending, sitting, standing, squatting, stairs, transfers, bathing, dressing, hygiene/grooming, locomotion level, and prosthetic use   PARTICIPATION LIMITATIONS: cleaning, laundry, and community activity   PERSONAL FACTORS Fitness, Time since onset of injury/illness/exacerbation, and 3+ comorbidities: see PMH  are also affecting patient's functional outcome.    REHAB POTENTIAL: Good   CLINICAL DECISION MAKING: Evolving/moderate complexity   EVALUATION COMPLEXITY: Moderate     GOALS: Goals reviewed with patient? Yes   SHORT TERM GOALS: Target date: 12/14/21   Patient independent with donning/doffing prosthesis. Baseline: see objective data Goal status: partially met 12/17/2021   2.  Patient verbalize proper assessment of wound with prosthetic use.  Baseline: see objective data Goal status: met 12/11/21   3.  Patient wears prosthesis >/= 8 hours/day daily without increase in wound & limb pain </= 5/10 to facilitate functional mobility and safety with transfers. Baseline: see objective data Goal status: met 12/05/21   4.  Patient able to transfer sit to/from stand chair with armrests with RW with supervision. Baseline: see objective data Goal status: met 12/05/21   5.  Patient ambulates 59' with RW & prosthesis / AFO with minA including negotiating around obstacles.  Baseline: see objective data Goal status: met 12/11/21 with SBA  UPDATED SHORT TERM GOALS: Target date: 01/11/22  Patient independent with donning/doffing prosthesis. Baseline: see objective data Goal status: ongoing 12/24/21  Patient able to transfer sit to/from stand chair without armrests with RW with  supervision. Baseline: see objective data Goal status: ongoing 12/24/21  Patient ambulates 100' with rollator & prosthesis / AFO with supervision including ramp and curb. Baseline: see objective data Goal status: ongoing 12/24/21  LONG TERM GOALS: Target date: 02/07/22   Patient wears prosthesis > 90% of waking hours with no limb pain or skin issues to promote function and safety. Baseline: see objective data Goal status: INITIAL   2.  Patient independent with all aspects of prosthetic care to enable safe utilization of prosthesis. Baseline: see objective data Goal status: INITIAL   3.  Patient ambulates 9' with LRAD safely and modified independent to allow for household mobility. Baseline: see objective data Goal status: INITIAL   4.  Patient able to ambulate 200' and navigate ramp and curb with LRAD safely with minA/ caregiver assist to allow for community mobility. Baseline: see objective data Goal status: INITIAL   5.  Berg balance score >/= 30/56 with RW to indicate low fall risk during household level balance requirements with UE support. Baseline: see objective data Goal status: INITIAL     PLAN: PT FREQUENCY: 2x/week   PT DURATION: other: 90 days   PLANNED INTERVENTIONS: Therapeutic exercises, Therapeutic activity, Neuromuscular re-education, Balance training, Gait training, Patient/Family education, Self Care, Stair training, Vestibular training, Orthotic/Fit training, Prosthetic training, DME instructions, Moist heat, scar mobilization, Manual therapy, Re-evaluation, and physical performance tests   PLAN FOR NEXT SESSION: check on prosthetist's appointment.  Assess residual limb wound. Ramp with rollator as tolerated, reinforce STS to chair without armrests. Continue standing balance activities.  Jana Hakim, Student-PT 12/26/2021, 4:42 PM  This entire session of physical therapy was performed under the direct supervision of PT signing evaluation /treatment. PT  reviewed note and agrees.  Jamey Reas, PT, DPT 12/26/2021, 4:48 PM

## 2021-12-31 ENCOUNTER — Encounter: Payer: Medicare Other | Admitting: Physical Therapy

## 2022-01-01 ENCOUNTER — Encounter: Payer: Self-pay | Admitting: Physical Therapy

## 2022-01-01 ENCOUNTER — Ambulatory Visit (INDEPENDENT_AMBULATORY_CARE_PROVIDER_SITE_OTHER): Payer: Medicare Other | Admitting: Physical Therapy

## 2022-01-01 DIAGNOSIS — L98498 Non-pressure chronic ulcer of skin of other sites with other specified severity: Secondary | ICD-10-CM

## 2022-01-01 DIAGNOSIS — R293 Abnormal posture: Secondary | ICD-10-CM | POA: Diagnosis not present

## 2022-01-01 DIAGNOSIS — M79605 Pain in left leg: Secondary | ICD-10-CM

## 2022-01-01 DIAGNOSIS — M25662 Stiffness of left knee, not elsewhere classified: Secondary | ICD-10-CM

## 2022-01-01 DIAGNOSIS — R2681 Unsteadiness on feet: Secondary | ICD-10-CM

## 2022-01-01 DIAGNOSIS — R2689 Other abnormalities of gait and mobility: Secondary | ICD-10-CM

## 2022-01-01 DIAGNOSIS — M6281 Muscle weakness (generalized): Secondary | ICD-10-CM | POA: Diagnosis not present

## 2022-01-01 NOTE — Therapy (Signed)
OUTPATIENT PHYSICAL THERAPY TREATMENT NOTE    Patient Name: [name] MRN: [id number] DOB:1951-01-20, 71 y.o., male Today's Date: 01/01/2022  PCP: Lorenda Hatchet, FNP REFERRING PROVIDER: [name], MD  END OF SESSION:   PT End of Session - 01/01/22 0849     Visit Number 14    Number of Visits 25    Date for PT Re-Evaluation 02/07/22    Authorization Type Medicare and BCBS    Progress Note Due on Visit 63    PT Start Time 0845    PT Stop Time 0918    PT Time Calculation (min) 33 min    Equipment Utilized During Treatment Gait belt    Activity Tolerance Patient limited by fatigue;Patient limited by pain    Behavior During Therapy [facility] for tasks assessed/performed              Past Medical History:  Diagnosis Date   Ambulates with cane    straight cane   Anemia    Cervical myelopathy ([employer]) 02/06/2018   Chronic kidney disease    dailysis M W F- home   Complication of anesthesia    Coronary artery disease    Diabetes mellitus without complication ([place])    type2   Diabetic foot ulcer ([place])    Diabetic neuropathy ([place]) 02/06/2018   Gait abnormality 08/24/2018   GERD (gastroesophageal reflux disease)     01/06/20- not current   Gout    History of blood transfusion    History of blood transfusion    History of kidney stones    passed stones   Hypercholesteremia    Hypertension    Hypothyroidism    Neuromuscular disorder ([place])    neuropathy left leg and bilateral feet   Neuropathy    PONV (postoperative nausea and vomiting)    Prostate cancer ([place])    PVD (peripheral vascular disease) ([place])    with amputations   Past Surgical History:  Procedure Laterality Date   A-FLUTTER ABLATION N/A 04/06/2020   Procedure: A-FLUTTER ABLATION;  Surgeon: [name], MD;  Location: [place] CV [facility];  Service: Cardiovascular;  Laterality: N/A;   AMPUTATION Left 12/25/2013   Procedure: AMPUTATION RAY LEFT 5TH RAY;  Surgeon: [name], MD;  Location: [facility];  Service: Orthopedics;  Laterality: Left;   AMPUTATION Right 12/15/2018   Procedure: AMPUTATION OF 4TH AND 5TH TOES RIGHT FOOT;  Surgeon: [name], MD;  Location: [place];  Service: Orthopedics;  Laterality: Right;   AMPUTATION Left 02/02/2021   Procedure: LEFT FOOT 4TH RAY AMPUTATION;  Surgeon: [name], MD;  Location: [place];  Service: Orthopedics;  Laterality: Left;   AMPUTATION Left 05/09/2021   Procedure: LEFT BELOW KNEE AMPUTATION;  Surgeon: [name], MD;  Location: [place];  Service: Orthopedics;  Laterality: Left;   APPLICATION OF WOUND VAC Right 12/15/2018   Procedure: APPLICATION OF WOUND VAC;  Surgeon: [name], MD;  Location: [place];  Service: Orthopedics;  Laterality: Right;   AV FISTULA PLACEMENT Left 03/25/2019   Procedure: LEFT ARM ARTERIOVENOUS (AV) FISTULA CREATION;  Surgeon: [name], MD;  Location: [place];  Service: Vascular;  Laterality: Left;   [place] Left 05/20/2019   Procedure: SECOND STAGE LEFT BASCILIC VEIN TRANSPOSITION;  Surgeon: [name], MD;  Location: [place];  Service: Vascular;  Laterality: Left;   CARDIAC CATHETERIZATION  02/17/2014   CHOLECYSTECTOMY  COLONOSCOPY  2011   in [place], Normal   CORONARY ARTERY BYPASS GRAFT  2008   CYSTOSCOPY N/A 06/29/2020   Procedure: CYSTOSCOPY WITH CLOT EVACUATION AND  FULGERATION;  Surgeon: [name], MD;  Location: [place];  Service: Urology;  Laterality: N/A;   CYSTOSCOPY WITH FULGERATION Bilateral 06/17/2020   Procedure: CYSTOSCOPY,BILATERAL RETROGRADE, CLOT EVACUATION WITH FULGERATION OF THE BLADDER;  Surgeon: Janith Lima, MD;  Location: [place];  Service: Urology;  Laterality: Bilateral;   CYSTOSCOPY WITH FULGERATION N/A 06/28/2020   Procedure: CYSTOSCOPY WITH CLOT EVACUATION AND FULGERATION OF BLEEDERS;  Surgeon: [name], MD;  Location: [place];  Service: Urology;  Laterality: N/A;  1 HR   I & D EXTREMITY Right 12/15/2018    Procedure: DEBRIDEMENT RIGHT FOOT;  Surgeon: [name], MD;  Location: [place];  Service: Orthopedics;  Laterality: Right;   I & D EXTREMITY Right 08/27/2019   Procedure: PARTIAL CUBOID EXCISION RIGHT FOOT;  Surgeon: [name], MD;  Location: [place];  Service: Orthopedics;  Laterality: Right;   I & D EXTREMITY Right 01/07/2020   Procedure: RIGHT FOOT EXCISION INFECTED BONE;  Surgeon: [name], MD;  Location: [place];  Service: Orthopedics;  Laterality: Right;   NECK SURGERY     novemver 2019   STUMP REVISION Left 06/27/2021   Procedure: REVISION LEFT BELOW KNEE AMPUTATION;  Surgeon: [name], MD;  Location: [place];  Service: Orthopedics;  Laterality: Left;   TRANSURETHRAL RESECTION OF PROSTATE N/A 06/28/2020   Procedure: TRANSURETHRAL RESECTION OF THE PROSTATE (TURP);  Surgeon: [name], MD;  Location: [place];  Service: Urology;  Laterality: N/A;   WISDOM TOOTH EXTRACTION     Patient Active Problem List   Diagnosis Date Noted   Community acquired pneumonia of left lower lobe of lung 08/18/2021   Paroxysmal atrial fibrillation ([place]) 08/18/2021   Anemia of chronic renal failure 08/18/2021   Thrombocytopenia ([place]) 62/83/1517   Acute metabolic encephalopathy 61/60/7371   Action induced myoclonus 08/18/2021   Dehiscence of amputation stump of left lower extremity ([place])    Left below-knee amputee ([place]) 05/11/2021   S/P BKA (below knee amputation) unilateral, left ([place]) 05/09/2021   Partial nontraumatic amputation of foot, left ([place]) 02/20/2021   Abscess of bursa of left foot 02/03/2021   Cutaneous abscess of left foot    Acute osteomyelitis of metatarsal bone of left foot (HCC)    Symptomatic anemia 09/13/2020   COVID-19 virus infection 09/13/2020   Pressure injury of skin 06/29/2020   Gross hematuria 06/16/2020   Typical atrial flutter ([place]) 03/24/2020   Bilateral pleural effusion 02/24/2020   Healthcare maintenance 01/28/2020   Shortness of breath 01/28/2020    History of partial ray amputation of fourth toe of right foot ([place]) 09/08/2019   Ulcerated, foot, right, with necrosis of bone (HCC)    Chronic cough 06/25/2019   Cutaneous abscess of right foot    Subacute osteomyelitis of right foot ([place]) 12/14/2018   AKI (acute kidney injury) ([place]) 12/14/2018   ESRD on hemodialysis ([address]) 12/14/2018   Gait abnormality 08/24/2018   Diabetic neuropathy ([place]) 02/06/2018   Cervical myelopathy ([place]) 02/06/2018   Onychomycosis 10/30/2017   Other spondylosis with radiculopathy, cervical region 01/27/2017   Midfoot ulcer, right, limited to breakdown of skin ([place]) 11/15/2016   Lateral epicondylitis, left elbow 08/12/2016   Cellulitis of fifth toe of right foot 07/29/2016   Prostate cancer ([place]) 06/19/2016   Non-pressure chronic ulcer of other part  of right foot limited to breakdown of skin ([place]) 04/04/2016   Cellulitis of leg, right 12/24/2015   Cellulitis of right lower extremity 12/24/2015   Gout 09/14/2012   Hypertension associated with diabetes ([place]) 09/14/2012   Hypothyroidism 09/14/2012   CAD (coronary artery disease) 09/14/2012   Insulin dependent type 2 diabetes mellitus ([place]) 09/14/2012   Hyperlipidemia associated with type 2 diabetes mellitus ([place])    Neuropathy ([place])     REFERRING DIAG: F35.456Y (ICD-10-CM) - Below-knee amputation of left lower extremity  THERAPY DIAG:  Unsteadiness on feet  Other abnormalities of gait and mobility  Abnormal posture  Muscle weakness (generalized)  Pain in left leg  Non-pressure chronic ulcer of skin of other sites with other specified severity (HCC)  Stiffness of left knee, not elsewhere classified  Rationale for Evaluation and Treatment Rehabilitation  PERTINENT HISTORY: braced right foot with cavovarus collapse, CAP without hypoxia, acute metabolic encephalopathy, ESRD on HD Sun/W/F, afib s/p ablation, DM II, neuropathy, HTN, HLD, hypothyroidism, anemia, prostate cancer s/p TUPP. cervical  myelopathy, gout, CAD w/CABG 2005  PRECAUTIONS: no BP in LUE, fall risk   SUBJECTIVE:  He saw Ronalee Belts twice on Thursday who added pads.  He has bruising on limb and pain is more in that area.  He and his wife went to see [place] [facility] over weekend using w/c.  PAIN:  Are you having pain?  Yes 3-4 /10 pain in distal lateral shin at rest since Saturday (3 days ago) worst 6/10  Aggravating factor: wearing & weight bearing on prosthesis  Alleviating factor: removing prosthesis   OBJECTIVE: (objective measures completed at initial evaluation unless otherwise dated) COGNITION: Overall cognitive status: Within functional limits for tasks assessed              SENSATION: Not tested   POSTURE: rounded shoulders, forward head, and anterior pelvic tilt - RLE resting position with tibial IR, severe foot supination and inversion with cavovarus collapse  PALPATION 12/17/21 Tenderness to palpation at distal lateral tibia, potential bone spur formation at distal lateral tibia   LOWER EXTREMITY ROM:   ROM (A: AROM, P: PROM) Right eval Left eval  Hip flexion      Hip extension      Hip abduction      Hip adduction      Hip internal rotation      Hip external rotation      Knee flexion      Knee extension   Standing with RW and prosthesis  A: -16*  Ankle dorsiflexion      Ankle plantarflexion      Ankle inversion      Ankle eversion       (Blank rows = not tested)   LOWER EXTREMITY MMT:   MMT Right eval Left eval  Hip flexion      Hip extension      Hip abduction      Hip adduction      Hip internal rotation      Hip external rotation      Knee flexion      Knee extension      Ankle dorsiflexion      Ankle plantarflexion      Ankle inversion      Ankle eversion      (Blank rows = not tested)   Functional strength with standing and transfers 3- at hip and knee bil., with no motion at Rt ankle   TRANSFERS: Sit to stand: minA -  fell backwards into chair upon first  attempt, MinA for next attempt with RW stabilization and verbal cueing for foot placement Stand to sit: minA for controlling descent and stabilization   GAIT: Gait pattern:  adduction RLE with toe-in, step to pattern, decreased step length- Right, decreased stance time- Right, decreased stride length, decreased hip/knee flexion- Left, decreased ankle dorsiflexion- Right, Left hip hike, Right foot flat, knee flexed in stance- Right, knee flexed in stance- Left, lateral hip instability, lateral lean- Right, trunk flexed, and poor foot clearance- Right Distance walked: 35' Assistive device utilized: Environmental consultant - 2 wheeled & Left TTA prosthesis, right double upright AFO custom shoe Level of assistance: MinA to recover losses of balance with straight surface walking, ModA to recover losses of balance turning Lt, MaxA with loss of balance during turning Rt Comments: Rt lower leg positioning reduced foot clearance with catching Lt heel most steps, and promoted Rt lateral leaning/lateral loss of balance. Requires excessive BUE support on RW.   FUNCTIONAL TESTs:  Berg Balance Scale: 6/56, 13/56 with RW   [facility] PT Assessment - 11/13/21 0001                Standardized Balance Assessment    Standardized Balance Assessment Berg Balance Test          Berg Balance Test    Sit to Stand Needs moderate or maximal assist to stand   Berg task with RW = 1    Standing Unsupported Unable to stand 30 seconds unassisted   Berg task with RW = 2    Sitting with Back Unsupported but Feet Supported on Floor or Stool Able to sit safely and securely 2 minutes     Stand to Sit Needs assistance to sit   Berg task with RW = 2    Transfers Able to transfer with verbal cueing and /or supervision     Standing Unsupported with Eyes Closed Needs help to keep from falling   Berg task with RW = 0    Standing Unsupported with Feet Together Needs help to attain position and unable to hold for 15 seconds   Berg task with RW = 0    From  Standing, Reach Forward with Outstretched Arm Loses balance while trying/requires external support   Berg task with RW = 1 (CGA reaching with Lt)    From Standing Position, Pick up Object from Floor Unable to try/needs assist to keep balance   Berg task with RW = 0    From Standing Position, Turn to Look Behind Over each Shoulder Needs assist to keep from losing balance and falling   Berg task with RW = 0    Turn 360 Degrees Needs assistance while turning   Berg task with RW = 0    Standing Unsupported, Alternately Place Feet on Step/Stool Needs assistance to keep from falling or unable to try   Berg task with RW = 0    Standing Unsupported, One Foot in [employer] balance while stepping or standing   Berg task with RW = 0    Standing on One Leg Unable to try or needs assist to prevent fall   Berg task with RW = 1    Total Score 6     Berg comment: Berg tasks with RW = 13/56                     CURRENT PROSTHETIC WEAR ASSESSMENT: Patient is dependent with: skin check, residual limb  care, care of non-amputated limb, prosthetic cleaning, ply sock cleaning, correct ply sock adjustment, proper wear schedule/adjustment, and proper weight-bearing schedule/adjustment Donning prosthesis: Min A and maximal cueing Doffing prosthesis: SBA Prosthetic wear tolerance: 30 minutes 1-2x/day 6 of 6 days since delivery Prosthetic weight bearing tolerance: stood ~5 minutes with c/o tibial crest pain moderate level after weight bearing Edema: present with slow capillary refill Residual limb condition: 1.6 cm scab along incision, cylinderical shape, normal color & temperature, mildly dry skin, slight scar adhesion at distal tibia,  Prosthetic description: silicone liner with pin lock suspension, 2nd pelite foam liner, total contact socket, SACH foot     TODAY'S TREATMENT:  12/31/2021 Prosthetic Training with left Transtibial Prosthesis & right double upright AFO: Distal lateral tibia has bruised area ~1cm  along with slight edema to area. He also has precallous changes to fibula head area with some discoloration & roughness to skin. Prosthetist added pretibial pads with increased thickness distally and thin popliteal pad.  There is a palpable lip at distal socket near area of bruising but would have been present prior to bruise. However this is in area of pain that he has been reporting.   Pt & wife questioning options for not using pelite secondary interface as less pain when they leave it off.  PT educated that ply socks would have to increase to keep his limb seated correct depth in socket.  PT recommended discussing with [facility].  PT educated that typically can do socket revision at 6 months and could switch systems at that time.  He may benefit from Vacuum Pin Suction system and recommended looking closer prior. Pt & wife verbalized understanding.  PT called prosthetist & left voicemail regarding new skin issues and recommended pt's wife call back if they have not heard from him by noon.  PT reviewed aligning pin with recommendation to focus on where circular umbrella of liner contacts limb prior to rolling it up. Pt & wife verbalized   12/26/2021 Prosthetic Training with left Transtibial Prosthesis & right double upright AFO: Wound on lateral residual limb is 85m across and lightly red, covered in tegaderm. PT advised use of w[facility]c and [facility] Pt [facility] into clinic with rollator and SBA, amb with significant shoulder shrug Pt required cueing for STS to chair with armrests x 1 and without armrests x 3 with rollator to lock brakes and reach posteriorly to chair for descent, scooting forward in chair for ascent PT educated on likelihood for increased fatigue with hematology concerns and impact on depressive sx, pt and wife verbalized understanding Pt ascend and descend ramp and curb with SBA Pt turn to sit and stand in rollator with SBA and slight movement of  rollator during STS  Neuro Re-ed: Feet hip width apart eyes open x 1 min frequent UE support and ranges 1-5 sec standing without UEs Modified tandem stance eyes open LLE posterior 2 x 30s with frequent UE support, increased limb pain with LLE anterior Feet hip width apart eyes closed x 30s with fingertip UE support, some increased sway noted   12/24/2021 Prosthetic Training with left Transtibial Prosthesis & right double upright AFO: PT advised not using gel on residual limb and using baby oil or nothing, pt and wife verbalized understanding Wound on lateral residual limb is [place], circular and red PT demonstrated application of Tegaderm for wound management with instruction for use during prosthesis wear time, pt and wife verbalized understanding with  application and use PT demonstrated donning of liner with lining up circle to bottom of limb and limiting wrinkling in posterior knee, pt verbalized understanding and returned demo PT suggest prosthetist reduce pre-tibial pads esp on lateral limb and add a thin gastroc pad to posterior socket, pt and wife verbalized understanding and asked if suggestion could be referred to prosthetist PT adjusted height of rollator and added visual cue for RLE step width Pt amb 30' SBA with rollator, ascend and descend curb with minA and significant cueing for technique. He entered & exited PT clinic with rollator with wife's supervision.   His wife requested weigh rollator which was 19# PT demo ascend and descend curb and ramp with pt videoing demo, instructed to watch videos >/= 3x daily for retention of technique Pt sit to chair without armrests with significant cueing for technique with locking brakes and single UE reach, pt verbalized understanding  HOME EXERCISE PROGRAM:   11/22/21 1244  PT Education  Education Details HEP at sink  Person(s) Educated Patient;Spouse  Methods Explanation;Demonstration;Tactile cues;Verbal cues;Handout  Comprehension  Verbalized understanding;Returned demonstration;Verbal cues required;Tactile cues required;Need further instruction    Do each exercise 1-2  times per day Do each exercise 5-10 repetitions Hold each exercise for 2 seconds to feel your location  AT [place].  Try to find this position when standing still for activities.   USE TAPE ON FLOOR TO MARK THE MIDLINE POSITION which is even with middle of sink.  You also should try to feel with your limb pressure in socket.  You are trying to feel with limb what you used to feel with the bottom of your foot.  Side to Side Shift: Moving your hips only (not shoulders): move weight onto your left leg, HOLD/FEEL pressure in socket.  Move back to equal weight on each leg, HOLD/FEEL pressure in socket. Move weight onto your right leg, HOLD/FEEL pressure in socket. Move back to equal weight on each leg, HOLD/FEEL pressure in socket. Repeat.  Start with both hands on sink, then right hand only, then left hand only Front to Back Shift: Moving your hips only (not shoulders): move your weight forward onto your toes, HOLD/FEEL pressure in socket. Move your weight back to equal Flat Foot on both legs, HOLD/FEEL  pressure in socket. Move your weight back onto your heels, HOLD/FEEL  pressure in socket. Move your weight back to equal on both legs, HOLD/FEEL  pressure in socket. Repeat.  Start with both hands on sink, then right hand only, then left hand only  Moving Cones / Cups: With equal weight on each leg: Hold on with one hand the first time, then progress to no hand supports. Move cups from one side of sink to the other. Place cups ~2" out of your reach, progress to 10" beyond reach.  Place one hand in middle of sink and reach with other hand. Do both arms.  Then hover one hand and move cups with other hand.  Overhead/Upward Reaching: alternated reaching up to top cabinets or ceiling if no cabinets  present. Keep equal weight on each leg. Start with one hand support on counter while other hand reaches and progress to no hand support with reaching.  ace one hand in middle of sink and reach with other hand. Do both arms.   Looking Over Shoulders: With equal weight on each leg: alternate turning to look over your shoulders with one hand support  on counter as needed.  Start with head motions only to look in front of shoulder, then even with shoulder and progress to looking behind you. To look to side, move head /eyes, then shoulder on side looking pulls back, shift more weight to side looking and pull hip back. Place one hand in middle of sink and let go with other hand so your shoulder can pull back. Switch hands to look other way. If looking right, use left hand at sink. If looking left, use right hand at sink. 6.  Stepping with leg tapping forefoot into lower cabinet:  Move items under cabinet out of your way. Shift your hips/pelvis so weight on right side. Tighten muscles in hip on right side.  SLOWLY step left leg so front of foot is in cabinet. Then step back to floor.  Repeat with other leg    ASSESSMENT:   CLINICAL IMPRESSION: Patient had bruising from pads added to socket and needs to see prosthetist to modify again.  He continues to be limited by pain.  He continues to benefit from skilled PT.    OBJECTIVE IMPAIRMENTS Abnormal gait, decreased activity tolerance, decreased balance, decreased coordination, decreased endurance, decreased knowledge of use of DME, decreased mobility, difficulty walking, decreased ROM, decreased strength, decreased safety awareness, increased edema, impaired flexibility, postural dysfunction, prosthetic dependency , and pain.    ACTIVITY LIMITATIONS bending, sitting, standing, squatting, stairs, transfers, bathing, dressing, hygiene/grooming, locomotion level, and prosthetic use   PARTICIPATION LIMITATIONS: cleaning, laundry, and community activity   PERSONAL  FACTORS Fitness, Time since onset of injury/illness/exacerbation, and 3+ comorbidities: see PMH  are also affecting patient's functional outcome.    REHAB POTENTIAL: Good   CLINICAL DECISION MAKING: Evolving/moderate complexity   EVALUATION COMPLEXITY: Moderate     GOALS: Goals reviewed with patient? Yes  UPDATED SHORT TERM GOALS: Target date: 01/11/22  Patient independent with donning/doffing prosthesis. Baseline: see objective data Goal status: ongoing 12/24/21  Patient able to transfer sit to/from stand chair without armrests with RW with supervision. Baseline: see objective data Goal status: ongoing 12/24/21  Patient ambulates 100' with rollator & prosthesis / AFO with supervision including ramp and curb. Baseline: see objective data Goal status: ongoing 12/24/21  LONG TERM GOALS: Target date: 02/07/22   Patient wears prosthesis > 90% of waking hours with no limb pain or skin issues to promote function and safety. Baseline: see objective data Goal status: INITIAL   2.  Patient independent with all aspects of prosthetic care to enable safe utilization of prosthesis. Baseline: see objective data Goal status: INITIAL   3.  Patient ambulates 47' with LRAD safely and modified independent to allow for household mobility. Baseline: see objective data Goal status: INITIAL   4.  Patient able to ambulate 200' and navigate ramp and curb with LRAD safely with minA/ caregiver assist to allow for community mobility. Baseline: see objective data Goal status: INITIAL   5.  Berg balance score >/= 30/56 with RW to indicate low fall risk during household level balance requirements with UE support. Baseline: see objective data Goal status: INITIAL     PLAN: PT FREQUENCY: 2x/week   PT DURATION: other: 90 days   PLANNED INTERVENTIONS: Therapeutic exercises, Therapeutic activity, Neuromuscular re-education, Balance training, Gait training, Patient/Family education, Self Care, Stair  training, Vestibular training, Orthotic/Fit training, Prosthetic training, DME instructions, Moist heat, scar mobilization, Manual therapy, Re-evaluation, and physical performance tests   PLAN FOR NEXT SESSION:   check on prosthetist's appointment for modification of pads.  Work towards updated STGs. Continue standing balance activities.  Jamey Reas, PT, DPT 01/01/2022, 1:30 PM

## 2022-01-03 ENCOUNTER — Ambulatory Visit (INDEPENDENT_AMBULATORY_CARE_PROVIDER_SITE_OTHER): Payer: Medicare Other | Admitting: Physical Therapy

## 2022-01-03 ENCOUNTER — Encounter: Payer: Self-pay | Admitting: Physical Therapy

## 2022-01-03 DIAGNOSIS — R293 Abnormal posture: Secondary | ICD-10-CM | POA: Diagnosis not present

## 2022-01-03 DIAGNOSIS — M6281 Muscle weakness (generalized): Secondary | ICD-10-CM

## 2022-01-03 DIAGNOSIS — R2681 Unsteadiness on feet: Secondary | ICD-10-CM | POA: Diagnosis not present

## 2022-01-03 DIAGNOSIS — L98498 Non-pressure chronic ulcer of skin of other sites with other specified severity: Secondary | ICD-10-CM

## 2022-01-03 DIAGNOSIS — R2689 Other abnormalities of gait and mobility: Secondary | ICD-10-CM | POA: Diagnosis not present

## 2022-01-03 DIAGNOSIS — M79605 Pain in left leg: Secondary | ICD-10-CM

## 2022-01-03 NOTE — Therapy (Signed)
OUTPATIENT PHYSICAL THERAPY TREATMENT NOTE    Patient Name: [name] MRN: [id number] DOB:03/19/1951, 71 y.o., male Today's Date: 01/03/2022  PCP: Lorenda Hatchet, FNP REFERRING PROVIDER: [name], MD  END OF SESSION:   PT End of Session - 01/03/22 0937     Visit Number 15    Number of Visits 25    Date for PT Re-Evaluation 02/07/22    Authorization Type Medicare and BCBS    Progress Note Due on Visit 3    PT Start Time 0930    PT Stop Time 1015    PT Time Calculation (min) 45 min    Equipment Utilized During Treatment Gait belt    Activity Tolerance Patient limited by fatigue;Patient limited by pain    Behavior During Therapy [facility] for tasks assessed/performed              Past Medical History:  Diagnosis Date   Ambulates with cane    straight cane   Anemia    Cervical myelopathy ([place]) 02/06/2018   Chronic kidney disease    dailysis M W F- home   Complication of anesthesia    Coronary artery disease    Diabetes mellitus without complication ([place])    type2   Diabetic foot ulcer ([place])    Diabetic neuropathy ([place]) 02/06/2018   Gait abnormality 08/24/2018   GERD (gastroesophageal reflux disease)     01/06/20- not current   Gout    History of blood transfusion    History of blood transfusion    History of kidney stones    passed stones   Hypercholesteremia    Hypertension    Hypothyroidism    Neuromuscular disorder ([place])    neuropathy left leg and bilateral feet   Neuropathy    PONV (postoperative nausea and vomiting)    Prostate cancer ([place])    PVD (peripheral vascular disease) ([place])    with amputations   Past Surgical History:  Procedure Laterality Date   A-FLUTTER ABLATION N/A 04/06/2020   Procedure: A-FLUTTER ABLATION;  Surgeon: [name], MD;  Location: [place] [facility];  Service: Cardiovascular;  Laterality: N/A;   AMPUTATION Left 12/25/2013   Procedure: AMPUTATION RAY LEFT 5TH RAY;  Surgeon: [name], MD;  Location: [facility];  Service: Orthopedics;  Laterality: Left;   AMPUTATION Right 12/15/2018   Procedure: AMPUTATION OF 4TH AND 5TH TOES RIGHT FOOT;  Surgeon: [name], MD;  Location: [place];  Service: Orthopedics;  Laterality: Right;   AMPUTATION Left 02/02/2021   Procedure: LEFT FOOT 4TH RAY AMPUTATION;  Surgeon: [name], MD;  Location: [place];  Service: Orthopedics;  Laterality: Left;   AMPUTATION Left 05/09/2021   Procedure: LEFT BELOW KNEE AMPUTATION;  Surgeon: [name], MD;  Location: [place];  Service: Orthopedics;  Laterality: Left;   APPLICATION OF WOUND VAC Right 12/15/2018   Procedure: APPLICATION OF WOUND VAC;  Surgeon: [name], MD;  Location: [place];  Service: Orthopedics;  Laterality: Right;   AV FISTULA PLACEMENT Left 03/25/2019   Procedure: LEFT ARM ARTERIOVENOUS (AV) FISTULA CREATION;  Surgeon: [name], MD;  Location: [place];  Service: Vascular;  Laterality: Left;   [place] Left 05/20/2019   Procedure: SECOND STAGE LEFT BASCILIC VEIN TRANSPOSITION;  Surgeon: [name], MD;  Location: [place];  Service: Vascular;  Laterality: Left;   CARDIAC CATHETERIZATION  02/17/2014   CHOLECYSTECTOMY  COLONOSCOPY  2011   in [place], Normal   CORONARY ARTERY BYPASS GRAFT  2008   CYSTOSCOPY N/A 06/29/2020   Procedure: CYSTOSCOPY WITH CLOT EVACUATION AND  FULGERATION;  Surgeon: [name], MD;  Location: [place];  Service: Urology;  Laterality: N/A;   CYSTOSCOPY WITH FULGERATION Bilateral 06/17/2020   Procedure: CYSTOSCOPY,BILATERAL RETROGRADE, CLOT EVACUATION WITH FULGERATION OF THE BLADDER;  Surgeon: Janith Lima, MD;  Location: [place];  Service: Urology;  Laterality: Bilateral;   CYSTOSCOPY WITH FULGERATION N/A 06/28/2020   Procedure: CYSTOSCOPY WITH CLOT EVACUATION AND FULGERATION OF BLEEDERS;  Surgeon: [name], MD;  Location: [place];  Service: Urology;  Laterality: N/A;  1 HR   I & D EXTREMITY Right 12/15/2018    Procedure: DEBRIDEMENT RIGHT FOOT;  Surgeon: [name], MD;  Location: [place];  Service: Orthopedics;  Laterality: Right;   I & D EXTREMITY Right 08/27/2019   Procedure: PARTIAL CUBOID EXCISION RIGHT FOOT;  Surgeon: [name], MD;  Location: [place];  Service: Orthopedics;  Laterality: Right;   I & D EXTREMITY Right 01/07/2020   Procedure: RIGHT FOOT EXCISION INFECTED BONE;  Surgeon: [name], MD;  Location: [place];  Service: Orthopedics;  Laterality: Right;   NECK SURGERY     novemver 2019   STUMP REVISION Left 06/27/2021   Procedure: REVISION LEFT BELOW KNEE AMPUTATION;  Surgeon: [name], MD;  Location: [place];  Service: Orthopedics;  Laterality: Left;   TRANSURETHRAL RESECTION OF PROSTATE N/A 06/28/2020   Procedure: TRANSURETHRAL RESECTION OF THE PROSTATE (TURP);  Surgeon: [name], MD;  Location: [place];  Service: Urology;  Laterality: N/A;   WISDOM TOOTH EXTRACTION     Patient Active Problem List   Diagnosis Date Noted   Community acquired pneumonia of left lower lobe of lung 08/18/2021   Paroxysmal atrial fibrillation ([place]) 08/18/2021   Anemia of chronic renal failure 08/18/2021   Thrombocytopenia ([place]) 62/83/1517   Acute metabolic encephalopathy 61/60/7371   Action induced myoclonus 08/18/2021   Dehiscence of amputation stump of left lower extremity ([place])    Left below-knee amputee ([place]) 05/11/2021   S/P BKA (below knee amputation) unilateral, left ([place]) 05/09/2021   Partial nontraumatic amputation of foot, left ([place]) 02/20/2021   Abscess of bursa of left foot 02/03/2021   Cutaneous abscess of left foot    Acute osteomyelitis of metatarsal bone of left foot (HCC)    Symptomatic anemia 09/13/2020   COVID-19 virus infection 09/13/2020   Pressure injury of skin 06/29/2020   Gross hematuria 06/16/2020   Typical atrial flutter ([place]) 03/24/2020   Bilateral pleural effusion 02/24/2020   Healthcare maintenance 01/28/2020   Shortness of breath 01/28/2020    History of partial ray amputation of fourth toe of right foot ([place]) 09/08/2019   Ulcerated, foot, right, with necrosis of bone (HCC)    Chronic cough 06/25/2019   Cutaneous abscess of right foot    Subacute osteomyelitis of right foot ([place]) 12/14/2018   AKI (acute kidney injury) ([place]) 12/14/2018   ESRD on hemodialysis ([address]) 12/14/2018   Gait abnormality 08/24/2018   Diabetic neuropathy ([place]) 02/06/2018   Cervical myelopathy ([place]) 02/06/2018   Onychomycosis 10/30/2017   Other spondylosis with radiculopathy, cervical region 01/27/2017   Midfoot ulcer, right, limited to breakdown of skin ([place]) 11/15/2016   Lateral epicondylitis, left elbow 08/12/2016   Cellulitis of fifth toe of right foot 07/29/2016   Prostate cancer ([place]) 06/19/2016   Non-pressure chronic ulcer of other part  of right foot limited to breakdown of skin ([place]) 04/04/2016   Cellulitis of leg, right 12/24/2015   Cellulitis of right lower extremity 12/24/2015   Gout 09/14/2012   Hypertension associated with diabetes ([place]) 09/14/2012   Hypothyroidism 09/14/2012   CAD (coronary artery disease) 09/14/2012   Insulin dependent type 2 diabetes mellitus ([place]) 09/14/2012   Hyperlipidemia associated with type 2 diabetes mellitus ([place])    Neuropathy ([place])     REFERRING DIAG: U13.244W (ICD-10-CM) - Below-knee amputation of left lower extremity  THERAPY DIAG:  Unsteadiness on feet  Other abnormalities of gait and mobility  Abnormal posture  Muscle weakness (generalized)  Pain in left leg  Non-pressure chronic ulcer of skin of other sites with other specified severity ([place])  Rationale for Evaluation and Treatment Rehabilitation  PERTINENT HISTORY: braced right foot with cavovarus collapse, CAP without hypoxia, acute metabolic encephalopathy, ESRD on HD Sun/W/F, afib s/p ablation, DM II, neuropathy, HTN, HLD, hypothyroidism, anemia, prostate cancer s/p TUPP. cervical myelopathy, gout, CAD w/CABG 2005  PRECAUTIONS: no BP in  LUE, fall risk   SUBJECTIVE:  He saw prosthetist Tuesday 2 days ago & he switched to thicker liner and ground socket / pad.  It is better but not perfect.  PAIN:  Are you having pain?  Yes seated 1/10,  standing 2/10, walking 3-5/10 increasing with distance, he has to rest if gets to 5/10 Location: pain in distal lateral shin  Aggravating factor: wearing & weight bearing on prosthesis  Alleviating factor: removing prosthesis   OBJECTIVE: (objective measures completed at initial evaluation unless otherwise dated) COGNITION: Overall cognitive status: Within functional limits for tasks assessed              SENSATION: Not tested   POSTURE: rounded shoulders, forward head, and anterior pelvic tilt - RLE resting position with tibial IR, severe foot supination and inversion with cavovarus collapse  PALPATION 12/17/21 Tenderness to palpation at distal lateral tibia, potential bone spur formation at distal lateral tibia   LOWER EXTREMITY ROM:   ROM (A: AROM, P: PROM) Right eval Left eval  Hip flexion      Hip extension      Hip abduction      Hip adduction      Hip internal rotation      Hip external rotation      Knee flexion      Knee extension   Standing with RW and prosthesis  A: -16*  Ankle dorsiflexion      Ankle plantarflexion      Ankle inversion      Ankle eversion       (Blank rows = not tested)   LOWER EXTREMITY MMT:   MMT Right eval Left eval  Hip flexion      Hip extension      Hip abduction      Hip adduction      Hip internal rotation      Hip external rotation      Knee flexion      Knee extension      Ankle dorsiflexion      Ankle plantarflexion      Ankle inversion      Ankle eversion      (Blank rows = not tested)   Functional strength with standing and transfers 3- at hip and knee bil., with no motion at Rt ankle   TRANSFERS: Sit to stand: minA - fell backwards into chair upon first attempt, MinA for next attempt with  RW stabilization and  verbal cueing for foot placement Stand to sit: minA for controlling descent and stabilization   GAIT: Gait pattern:  adduction RLE with toe-in, step to pattern, decreased step length- Right, decreased stance time- Right, decreased stride length, decreased hip/knee flexion- Left, decreased ankle dorsiflexion- Right, Left hip hike, Right foot flat, knee flexed in stance- Right, knee flexed in stance- Left, lateral hip instability, lateral lean- Right, trunk flexed, and poor foot clearance- Right Distance walked: 35' Assistive device utilized: Environmental consultant - 2 wheeled & Left TTA prosthesis, right double upright AFO custom shoe Level of assistance: MinA to recover losses of balance with straight surface walking, ModA to recover losses of balance turning Lt, MaxA with loss of balance during turning Rt Comments: Rt lower leg positioning reduced foot clearance with catching Lt heel most steps, and promoted Rt lateral leaning/lateral loss of balance. Requires excessive BUE support on RW.   FUNCTIONAL TESTs:  Berg Balance Scale: 6/56, 13/56 with RW   [facility] PT Assessment - 11/13/21 0001                Standardized Balance Assessment    Standardized Balance Assessment Berg Balance Test          Berg Balance Test    Sit to Stand Needs moderate or maximal assist to stand   Berg task with RW = 1    Standing Unsupported Unable to stand 30 seconds unassisted   Berg task with RW = 2    Sitting with Back Unsupported but Feet Supported on Floor or Stool Able to sit safely and securely 2 minutes     Stand to Sit Needs assistance to sit   Berg task with RW = 2    Transfers Able to transfer with verbal cueing and /or supervision     Standing Unsupported with Eyes Closed Needs help to keep from falling   Berg task with RW = 0    Standing Unsupported with Feet Together Needs help to attain position and unable to hold for 15 seconds   Berg task with RW = 0    From Standing, Reach Forward with Outstretched Arm Loses  balance while trying/requires external support   Berg task with RW = 1 (CGA reaching with Lt)    From Standing Position, Pick up Object from Floor Unable to try/needs assist to keep balance   Berg task with RW = 0    From Standing Position, Turn to Look Behind Over each Shoulder Needs assist to keep from losing balance and falling   Berg task with RW = 0    Turn 360 Degrees Needs assistance while turning   Berg task with RW = 0    Standing Unsupported, Alternately Place Feet on Step/Stool Needs assistance to keep from falling or unable to try   Berg task with RW = 0    Standing Unsupported, One Foot in [employer] balance while stepping or standing   Berg task with RW = 0    Standing on One Leg Unable to try or needs assist to prevent fall   Berg task with RW = 1    Total Score 6     Berg comment: Berg tasks with RW = 13/56                     CURRENT PROSTHETIC WEAR ASSESSMENT: Patient is dependent with: skin check, residual limb care, care of non-amputated limb, prosthetic cleaning, ply sock cleaning, correct ply  sock adjustment, proper wear schedule/adjustment, and proper weight-bearing schedule/adjustment Donning prosthesis: Min A and maximal cueing Doffing prosthesis: SBA Prosthetic wear tolerance: 30 minutes 1-2x/day 6 of 6 days since delivery Prosthetic weight bearing tolerance: stood ~5 minutes with c/o tibial crest pain moderate level after weight bearing Edema: present with slow capillary refill Residual limb condition: 1.6 cm scab along incision, cylinderical shape, normal color & temperature, mildly dry skin, slight scar adhesion at distal tibia,  Prosthetic description: silicone liner with pin lock suspension, 2nd pelite foam liner, total contact socket, SACH foot     TODAY'S TREATMENT:  01/03/2022 Prosthetic Training with left Transtibial Prosthesis & right double upright AFO: Pt ambulated in /out of PT with rollator walker with verbal cues for RLE foot position / not  adducting and upright posture looking forward.  Long discussion with patient & wife on LTGs with focus on SAFETY and progressing mobility for community level.  Kasandra Knudsen places him at [place] risk of falls and increased energy expenditure.  Rollator or RW is safer and enables more mobility for community distances & barriers. Pt & wife verbalized understanding and pt to consider discussion topic. Pt's residual limb has no openings and bruising has subsided. He reports less pain.  Therapeutic exercise: Standing BUE support //bars: red theraband around ankles -  mirror to facilitate upright posture  Alternating abduction including eccentric control 10 reps  Alternating stepping forward with visual target to maintain proper step width 10 reps    12/31/2021 Prosthetic Training with left Transtibial Prosthesis & right double upright AFO: Distal lateral tibia has bruised area ~1cm along with slight edema to area. He also has precallous changes to fibula head area with some discoloration & roughness to skin. Prosthetist added pretibial pads with increased thickness distally and thin popliteal pad.  There is a palpable lip at distal socket near area of bruising but would have been present prior to bruise. However this is in area of pain that he has been reporting.   Pt & wife questioning options for not using pelite secondary interface as less pain when they leave it off.  PT educated that ply socks would have to increase to keep his limb seated correct depth in socket.  PT recommended [facility] with [facility].  PT educated that typically can do socket revision at 6 months and could switch systems at that time.  He may benefit from Vacuum Pin Suction system and recommended looking closer prior. Pt & wife verbalized understanding.  PT called prosthetist & left voicemail regarding new skin issues and recommended pt's wife call back if they have not heard from him by noon.  PT reviewed aligning pin with recommendation  to focus on where circular umbrella of liner contacts limb prior to rolling it up. Pt & wife verbalized   12/26/2021 Prosthetic Training with left Transtibial Prosthesis & right double upright AFO: Wound on lateral residual limb is 70m across and lightly red, covered in tegaderm. PT advised use of w/c and RW [facility] Pt amb 100' into clinic with rollator and SBA, amb with significant shoulder shrug Pt required cueing for STS to chair with armrests x 1 and without armrests x 3 with rollator to lock brakes and reach posteriorly to chair for descent, scooting forward in chair for ascent PT educated on likelihood for increased fatigue with hematology concerns and impact on depressive sx, pt and wife verbalized understanding Pt ascend and descend ramp and curb with SBA Pt turn  to sit and stand in rollator with SBA and slight movement of rollator during STS  Neuro Re-ed: Feet hip width apart eyes open x 1 min frequent UE support and ranges 1-5 sec standing without UEs Modified tandem stance eyes open LLE posterior 2 x 30s with frequent UE support, increased limb pain with LLE anterior Feet hip width apart eyes closed x 30s with fingertip UE support, some increased sway noted   12/24/2021 Prosthetic Training with left Transtibial Prosthesis & right double upright AFO: PT advised not using gel on residual limb and using baby oil or nothing, pt and wife verbalized understanding Wound on lateral residual limb is 75m across, circular and red PT demonstrated application of Tegaderm for wound management with instruction for use during prosthesis wear time, pt and wife verbalized understanding with application and use PT demonstrated donning of liner with lining up circle to bottom of limb and limiting wrinkling in posterior knee, pt verbalized understanding and returned demo PT suggest prosthetist reduce pre-tibial pads esp on lateral limb and add a thin gastroc pad to  posterior socket, pt and wife verbalized understanding and asked if suggestion could be referred to prosthetist PT adjusted height of rollator and added visual cue for RLE step width Pt amb 30' SBA with rollator, ascend and descend curb with minA and significant cueing for technique. He entered & exited PT clinic with rollator with wife's supervision.   His wife requested weigh rollator which was 19# PT demo ascend and descend curb and ramp with pt videoing demo, instructed to watch videos >/= 3x daily for retention of technique Pt sit to chair without armrests with significant cueing for technique with locking brakes and single UE reach, pt verbalized understanding  HOME EXERCISE PROGRAM:   11/22/21 1244  PT Education  Education Details HEP at sink  Person(s) Educated Patient;Spouse  Methods Explanation;Demonstration;Tactile cues;Verbal cues;Handout  Comprehension Verbalized understanding;Returned demonstration;Verbal cues required;Tactile cues required;Need further instruction    Do each exercise 1-2  times per day Do each exercise 5-10 repetitions Hold each exercise for 2 seconds to feel [place]  [place]  Try to find this position when standing still for activities.   USE TAPE ON FLOOR TO MARK THE MIDLINE POSITION which is even with middle of sink.  You also should try to feel with your limb pressure in socket.  You are trying to feel with limb what you used to feel with the bottom of your foot.  Side to Side Shift: Moving your hips only (not shoulders): move weight onto your left leg, HOLD/FEEL pressure in socket.  Move back to equal weight on each leg, HOLD/FEEL pressure in socket. Move weight onto your right leg, HOLD/FEEL pressure in socket. Move back to equal weight on each leg, HOLD/FEEL pressure in socket. Repeat.  Start with both hands on sink, then right hand only, then left hand only Front to Back  Shift: Moving your hips only (not shoulders): move your weight forward onto your toes, HOLD/FEEL pressure in socket. Move your weight back to equal Flat Foot on both legs, HOLD/FEEL  pressure in socket. Move your weight back onto your heels, HOLD/FEEL  pressure in socket. Move your weight back to equal on both legs, HOLD/FEEL  pressure in socket. Repeat.  Start with both hands on sink, then right hand only, then left hand only  Moving Cones / Cups: With equal weight on each leg: Hold on  with one hand the first time, then progress to no hand supports. Move cups from one side of sink to the other. Place cups ~2" out of your reach, progress to 10" beyond reach.  Place one hand in middle of sink and reach with other hand. Do both arms.  Then hover one hand and move cups with other hand.  Overhead/Upward Reaching: alternated reaching up to top cabinets or ceiling if no cabinets present. Keep equal weight on each leg. Start with one hand support on counter while other hand reaches and progress to no hand support with reaching.  ace one hand in middle of sink and reach with other hand. Do both arms.   Looking Over Shoulders: With equal weight on each leg: alternate turning to look over your shoulders with one hand support on counter as needed.  Start with head motions only to look in front of shoulder, then even with shoulder and progress to looking behind you. To look to side, move head /eyes, then shoulder on side looking pulls back, shift more weight to side looking and pull hip back. Place one hand in middle of sink and let go with other hand so your shoulder can pull back. Switch hands to look other way. If looking right, use left hand at sink. If looking left, use right hand at sink. 6.  Stepping with leg tapping forefoot into lower cabinet:  Move items under cabinet out of your way. Shift your hips/pelvis so weight on right side. Tighten muscles in hip on right side.  SLOWLY step left leg so front of foot is in  cabinet. Then step back to floor.  Repeat with other leg    ASSESSMENT:   CLINICAL IMPRESSION: Pain seems improved with changes from prosthetist.  PT discussed safety & function as target for LTGs.  He does not appear would be safe with cane for gait.  His wife is in agreement but patient continues to feel his LTG should be cane for gait.   He continues to benefit from skilled PT.    OBJECTIVE IMPAIRMENTS Abnormal gait, decreased activity tolerance, decreased balance, decreased coordination, decreased endurance, decreased knowledge of use of DME, decreased mobility, difficulty walking, decreased ROM, decreased strength, decreased safety awareness, increased edema, impaired flexibility, postural dysfunction, prosthetic dependency , and pain.    ACTIVITY LIMITATIONS bending, sitting, standing, squatting, stairs, transfers, bathing, dressing, hygiene/grooming, locomotion level, and prosthetic use   PARTICIPATION LIMITATIONS: cleaning, laundry, and community activity   PERSONAL FACTORS Fitness, Time since onset of injury/illness/exacerbation, and 3+ comorbidities: see PMH  are also affecting patient's functional outcome.    REHAB POTENTIAL: Good   CLINICAL DECISION MAKING: Evolving/moderate complexity   EVALUATION COMPLEXITY: Moderate     GOALS: Goals reviewed with patient? Yes  UPDATED SHORT TERM GOALS: Target date: 01/11/22  Patient independent with donning/doffing prosthesis. Baseline: see objective data Goal status: ongoing 12/24/21  Patient able to transfer sit to/from stand chair without armrests with RW with supervision. Baseline: see objective data Goal status: ongoing 12/24/21  Patient ambulates 100' with rollator & prosthesis / AFO with supervision including ramp and curb. Baseline: see objective data Goal status: ongoing 12/24/21  LONG TERM GOALS: Target date: 02/07/22   Patient wears prosthesis > 90% of waking hours with no limb pain or skin issues to promote function and  safety. Baseline: see objective data Goal status: INITIAL   2.  Patient independent with all aspects of prosthetic care to enable safe utilization of prosthesis. Baseline: see objective  data Goal status: INITIAL   3.  Patient ambulates 79' with LRAD safely and modified independent to allow for household mobility. Baseline: see objective data Goal status: INITIAL   4.  Patient able to ambulate 200' and navigate ramp and curb with LRAD safely with minA/ caregiver assist to allow for community mobility. Baseline: see objective data Goal status: INITIAL   5.  Berg balance score >/= 30/56 with RW to indicate low fall risk during household level balance requirements with UE support. Baseline: see objective data Goal status: INITIAL     PLAN: PT FREQUENCY: 2x/week   PT DURATION: other: 90 days   PLANNED INTERVENTIONS: Therapeutic exercises, Therapeutic activity, Neuromuscular re-education, Balance training, Gait training, Patient/Family education, Self Care, Stair training, Vestibular training, Orthotic/Fit training, Prosthetic training, DME instructions, Moist heat, scar mobilization, Manual therapy, Re-evaluation, and physical performance tests   PLAN FOR NEXT SESSION:   check updated STGs. Continue standing balance activities. Work on functional gait progressing distance and scanning while ambulating.   Jamey Reas, PT, DPT 01/03/2022, 11:41 AM

## 2022-01-07 ENCOUNTER — Ambulatory Visit (INDEPENDENT_AMBULATORY_CARE_PROVIDER_SITE_OTHER): Payer: Medicare Other | Admitting: Physical Therapy

## 2022-01-07 ENCOUNTER — Encounter: Payer: Self-pay | Admitting: Physical Therapy

## 2022-01-07 DIAGNOSIS — M6281 Muscle weakness (generalized): Secondary | ICD-10-CM

## 2022-01-07 DIAGNOSIS — R293 Abnormal posture: Secondary | ICD-10-CM | POA: Diagnosis not present

## 2022-01-07 DIAGNOSIS — L98498 Non-pressure chronic ulcer of skin of other sites with other specified severity: Secondary | ICD-10-CM

## 2022-01-07 DIAGNOSIS — R2689 Other abnormalities of gait and mobility: Secondary | ICD-10-CM

## 2022-01-07 DIAGNOSIS — R2681 Unsteadiness on feet: Secondary | ICD-10-CM | POA: Diagnosis not present

## 2022-01-07 DIAGNOSIS — M79605 Pain in left leg: Secondary | ICD-10-CM

## 2022-01-07 DIAGNOSIS — M25662 Stiffness of left knee, not elsewhere classified: Secondary | ICD-10-CM

## 2022-01-07 NOTE — Therapy (Signed)
OUTPATIENT PHYSICAL THERAPY TREATMENT NOTE    Patient Name: [name] MRN: [id number] DOB:06-29-1950, 71 y.o., male Today's Date: 01/07/2022  PCP: Lorenda Hatchet, FNP REFERRING PROVIDER: [name], MD  END OF SESSION:   PT End of Session - 01/07/22 1145     Visit Number 16    Number of Visits 25    Date for PT Re-Evaluation 02/07/22    Authorization Type Medicare and BCBS    Progress Note Due on Visit 66    PT Start Time 1145    PT Stop Time 1231    PT Time Calculation (min) 46 min    Equipment Utilized During Treatment Gait belt    Activity Tolerance Patient limited by fatigue;Patient limited by pain    Behavior During Therapy [facility] for tasks assessed/performed               Past Medical History:  Diagnosis Date   Ambulates with cane    straight cane   Anemia    Cervical myelopathy ([place]) 02/06/2018   Chronic kidney disease    dailysis M W F- home   Complication of anesthesia    Coronary artery disease    Diabetes mellitus without complication ([place])    type2   Diabetic foot ulcer ([place])    Diabetic neuropathy ([place]) 02/06/2018   Gait abnormality 08/24/2018   GERD (gastroesophageal reflux disease)     01/06/20- not current   Gout    History of blood transfusion    History of blood transfusion    History of kidney stones    passed stones   Hypercholesteremia    Hypertension    Hypothyroidism    Neuromuscular disorder ([place])    neuropathy left leg and bilateral feet   Neuropathy    PONV (postoperative nausea and vomiting)    Prostate cancer ([place])    PVD (peripheral vascular disease) ([place])    with amputations   Past Surgical History:  Procedure Laterality Date   A-FLUTTER ABLATION N/A 04/06/2020   Procedure: A-FLUTTER ABLATION;  Surgeon: [name], MD;  Location: [place] [facility];  Service: Cardiovascular;  Laterality: N/A;   AMPUTATION Left 12/25/2013   Procedure: AMPUTATION RAY LEFT 5TH RAY;  Surgeon: [name], MD;  Location: [facility];  Service: Orthopedics;  Laterality: Left;   AMPUTATION Right 12/15/2018   Procedure: AMPUTATION OF 4TH AND 5TH TOES RIGHT FOOT;  Surgeon: [name], MD;  Location: [place];  Service: Orthopedics;  Laterality: Right;   AMPUTATION Left 02/02/2021   Procedure: LEFT FOOT 4TH RAY AMPUTATION;  Surgeon: [name], MD;  Location: [place];  Service: Orthopedics;  Laterality: Left;   AMPUTATION Left 05/09/2021   Procedure: LEFT BELOW KNEE AMPUTATION;  Surgeon: [name], MD;  Location: [place];  Service: Orthopedics;  Laterality: Left;   APPLICATION OF WOUND VAC Right 12/15/2018   Procedure: APPLICATION OF WOUND VAC;  Surgeon: [name], MD;  Location: [place];  Service: Orthopedics;  Laterality: Right;   AV FISTULA PLACEMENT Left 03/25/2019   Procedure: LEFT ARM ARTERIOVENOUS (AV) FISTULA CREATION;  Surgeon: [name], MD;  Location: [place];  Service: Vascular;  Laterality: Left;   [place] Left 05/20/2019   Procedure: SECOND STAGE LEFT BASCILIC VEIN TRANSPOSITION;  Surgeon: [name], MD;  Location: [place];  Service: Vascular;  Laterality: Left;   CARDIAC CATHETERIZATION  02/17/2014   CHOLECYSTECTOMY  COLONOSCOPY  2011   in [place], Normal   CORONARY ARTERY BYPASS GRAFT  2008   CYSTOSCOPY N/A 06/29/2020   Procedure: CYSTOSCOPY WITH CLOT EVACUATION AND  FULGERATION;  Surgeon: [name], MD;  Location: [place];  Service: Urology;  Laterality: N/A;   CYSTOSCOPY WITH FULGERATION Bilateral 06/17/2020   Procedure: CYSTOSCOPY,BILATERAL RETROGRADE, CLOT EVACUATION WITH FULGERATION OF THE BLADDER;  Surgeon: Janith Lima, MD;  Location: [place];  Service: Urology;  Laterality: Bilateral;   CYSTOSCOPY WITH FULGERATION N/A 06/28/2020   Procedure: CYSTOSCOPY WITH CLOT EVACUATION AND FULGERATION OF BLEEDERS;  Surgeon: [name], MD;  Location: [place];  Service: Urology;  Laterality: N/A;  1 HR   I & D EXTREMITY Right 12/15/2018    Procedure: DEBRIDEMENT RIGHT FOOT;  Surgeon: [name], MD;  Location: [place];  Service: Orthopedics;  Laterality: Right;   I & D EXTREMITY Right 08/27/2019   Procedure: PARTIAL CUBOID EXCISION RIGHT FOOT;  Surgeon: [name], MD;  Location: [place];  Service: Orthopedics;  Laterality: Right;   I & D EXTREMITY Right 01/07/2020   Procedure: RIGHT FOOT EXCISION INFECTED BONE;  Surgeon: [name], MD;  Location: [place];  Service: Orthopedics;  Laterality: Right;   NECK SURGERY     novemver 2019   STUMP REVISION Left 06/27/2021   Procedure: REVISION LEFT BELOW KNEE AMPUTATION;  Surgeon: [name], MD;  Location: [place];  Service: Orthopedics;  Laterality: Left;   TRANSURETHRAL RESECTION OF PROSTATE N/A 06/28/2020   Procedure: TRANSURETHRAL RESECTION OF THE PROSTATE (TURP);  Surgeon: [name], MD;  Location: [place];  Service: Urology;  Laterality: N/A;   WISDOM TOOTH EXTRACTION     Patient Active Problem List   Diagnosis Date Noted   Community acquired pneumonia of left lower lobe of lung 08/18/2021   Paroxysmal atrial fibrillation ([place]) 08/18/2021   Anemia of chronic renal failure 08/18/2021   Thrombocytopenia ([place]) 62/83/1517   Acute metabolic encephalopathy 61/60/7371   Action induced myoclonus 08/18/2021   Dehiscence of amputation stump of left lower extremity ([place])    Left below-knee amputee ([place]) 05/11/2021   S/P BKA (below knee amputation) unilateral, left ([place]) 05/09/2021   Partial nontraumatic amputation of foot, left ([place]) 02/20/2021   Abscess of bursa of left foot 02/03/2021   Cutaneous abscess of left foot    Acute osteomyelitis of metatarsal bone of left foot (HCC)    Symptomatic anemia 09/13/2020   COVID-19 virus infection 09/13/2020   Pressure injury of skin 06/29/2020   Gross hematuria 06/16/2020   Typical atrial flutter ([place]) 03/24/2020   Bilateral pleural effusion 02/24/2020   Healthcare maintenance 01/28/2020   Shortness of breath 01/28/2020    History of partial ray amputation of fourth toe of right foot ([place]) 09/08/2019   Ulcerated, foot, right, with necrosis of bone (HCC)    Chronic cough 06/25/2019   Cutaneous abscess of right foot    Subacute osteomyelitis of right foot ([place]) 12/14/2018   AKI (acute kidney injury) ([place]) 12/14/2018   ESRD on hemodialysis ([place]) 12/14/2018   Gait abnormality 08/24/2018   Diabetic neuropathy ([place]) 02/06/2018   Cervical myelopathy ([place]) 02/06/2018   Onychomycosis 10/30/2017   Other spondylosis with radiculopathy, cervical region 01/27/2017   Midfoot ulcer, right, limited to breakdown of skin ([place]) 11/15/2016   Lateral epicondylitis, left elbow 08/12/2016   Cellulitis of fifth toe of right foot 07/29/2016   Prostate cancer ([place]) 06/19/2016   Non-pressure chronic ulcer of other part  of right foot limited to breakdown of skin ([place]) 04/04/2016   Cellulitis of leg, right 12/24/2015   Cellulitis of right lower extremity 12/24/2015   Gout 09/14/2012   Hypertension associated with diabetes ([place]) 09/14/2012   Hypothyroidism 09/14/2012   CAD (coronary artery disease) 09/14/2012   Insulin dependent type 2 diabetes mellitus ([place]) 09/14/2012   Hyperlipidemia associated with type 2 diabetes mellitus ([place])    Neuropathy ([place])     REFERRING DIAG: S97.026V (ICD-10-CM) - Below-knee amputation of left lower extremity  THERAPY DIAG:  Unsteadiness on feet  Other abnormalities of gait and mobility  Abnormal posture  Muscle weakness (generalized)  Pain in left leg  Non-pressure chronic ulcer of skin of other sites with other specified severity (HCC)  Stiffness of left knee, not elsewhere classified  Rationale for Evaluation and Treatment Rehabilitation  PERTINENT HISTORY: braced right foot with cavovarus collapse, CAP without hypoxia, acute metabolic encephalopathy, ESRD on HD Sun/W/F, afib s/p ablation, DM II, neuropathy, HTN, HLD, hypothyroidism, anemia, prostate cancer s/p TUPP. cervical  myelopathy, gout, CAD w/CABG 2005  PRECAUTIONS: no BP in LUE, fall risk   SUBJECTIVE: they went to [place] this weekend using w/c but used rollator walker for restaurant prior.  His prosthesis is continuing to feel better.  He is wearing prosthesis most of awake hours except when doing dialysis (home).   PAIN:  Are you having pain?  Yes seated 1/10,  standing 1-2/10, walking 2-3/10 increasing with distance, highest since last PT 5-6/10 Location: pain in distal lateral shin  Aggravating factor: wearing & weight bearing on prosthesis  Alleviating factor: removing prosthesis   OBJECTIVE: (objective measures completed at initial evaluation unless otherwise dated) COGNITION: Overall cognitive status: Within functional limits for tasks assessed              SENSATION: Not tested   POSTURE: rounded shoulders, forward head, and anterior pelvic tilt - RLE resting position with tibial IR, severe foot supination and inversion with cavovarus collapse  PALPATION 12/17/21 Tenderness to palpation at distal lateral tibia, potential bone spur formation at distal lateral tibia   LOWER EXTREMITY ROM:   ROM (A: AROM, P: PROM) Right eval Left eval  Hip flexion      Hip extension      Hip abduction      Hip adduction      Hip internal rotation      Hip external rotation      Knee flexion      Knee extension   Standing with RW and prosthesis  A: -16*  Ankle dorsiflexion      Ankle plantarflexion      Ankle inversion      Ankle eversion       (Blank rows = not tested)   LOWER EXTREMITY MMT:   MMT Right eval Left eval  Hip flexion      Hip extension      Hip abduction      Hip adduction      Hip internal rotation      Hip external rotation      Knee flexion      Knee extension      Ankle dorsiflexion      Ankle plantarflexion      Ankle inversion      Ankle eversion      (Blank rows = not tested)   Functional strength with standing and transfers 3- at hip and knee bil., with no  motion at Rt ankle  TRANSFERS: Sit to stand: minA - fell backwards into chair upon first attempt, MinA for next attempt with RW stabilization and verbal cueing for foot placement Stand to sit: minA for controlling descent and stabilization   GAIT: Gait pattern:  adduction RLE with toe-in, step to pattern, decreased step length- Right, decreased stance time- Right, decreased stride length, decreased hip/knee flexion- Left, decreased ankle dorsiflexion- Right, Left hip hike, Right foot flat, knee flexed in stance- Right, knee flexed in stance- Left, lateral hip instability, lateral lean- Right, trunk flexed, and poor foot clearance- Right Distance walked: 35' Assistive device utilized: Environmental consultant - 2 wheeled & Left TTA prosthesis, right double upright AFO custom shoe Level of assistance: MinA to recover losses of balance with straight surface walking, ModA to recover losses of balance turning Lt, MaxA with loss of balance during turning Rt Comments: Rt lower leg positioning reduced foot clearance with catching Lt heel most steps, and promoted Rt lateral leaning/lateral loss of balance. Requires excessive BUE support on RW.   FUNCTIONAL TESTs:  Berg Balance Scale: 6/56, 13/56 with RW   [facility] PT Assessment - 11/13/21 0001                Standardized Balance Assessment    Standardized Balance Assessment Berg Balance Test          Berg Balance Test    Sit to Stand Needs moderate or maximal assist to stand   Berg task with RW = 1    Standing Unsupported Unable to stand 30 seconds unassisted   Berg task with RW = 2    Sitting with Back Unsupported but Feet Supported on Floor or Stool Able to sit safely and securely 2 minutes     Stand to Sit Needs assistance to sit   Berg task with RW = 2    Transfers Able to transfer with verbal cueing and /or supervision     Standing Unsupported with Eyes Closed Needs help to keep from falling   Berg task with RW = 0    Standing Unsupported with Feet Together  Needs help to attain position and unable to hold for 15 seconds   Berg task with RW = 0    From Standing, Reach Forward with Outstretched Arm Loses balance while trying/requires external support   Berg task with RW = 1 (CGA reaching with Lt)    From Standing Position, Pick up Object from Floor Unable to try/needs assist to keep balance   Berg task with RW = 0    From Standing Position, Turn to Look Behind Over each Shoulder Needs assist to keep from losing balance and falling   Berg task with RW = 0    Turn 360 Degrees Needs assistance while turning   Berg task with RW = 0    Standing Unsupported, Alternately Place Feet on Step/Stool Needs assistance to keep from falling or unable to try   Berg task with RW = 0    Standing Unsupported, One Foot in [employer] balance while stepping or standing   Berg task with RW = 0    Standing on One Leg Unable to try or needs assist to prevent fall   Berg task with RW = 1    Total Score 6     Berg comment: Berg tasks with RW = 13/56                     CURRENT PROSTHETIC WEAR ASSESSMENT: Patient is dependent  with: skin check, residual limb care, care of non-amputated limb, prosthetic cleaning, ply sock cleaning, correct ply sock adjustment, proper wear schedule/adjustment, and proper weight-bearing schedule/adjustment Donning prosthesis: Min A and maximal cueing Doffing prosthesis: SBA Prosthetic wear tolerance: 30 minutes 1-2x/day 6 of 6 days since delivery Prosthetic weight bearing tolerance: stood ~5 minutes with c/o tibial crest pain moderate level after weight bearing Edema: present with slow capillary refill Residual limb condition: 1.6 cm scab along incision, cylinderical shape, normal color & temperature, mildly dry skin, slight scar adhesion at distal tibia,  Prosthetic description: silicone liner with pin lock suspension, 2nd pelite foam liner, total contact socket, SACH foot     TODAY'S TREATMENT:  01/07/2022 Prosthetic Training with left  Transtibial Prosthesis & right double upright AFO: Pt ambulated >150'  in /out of PT with rollator walker with verbal cues for RLE foot position / not adducting and upright posture looking forward.  Pt donnes & doffes prosthesis independently but verbal cues for initiating liner to minimize pain.  Pt reports that he is in agreement with not ambulating with cane.  Pt neg curb with rollator walker with supervision. He reports ramp (one in PT clinic is 11-12* incline) hurts his limb too much. Pt & wife report able to neg ramps in community.   Therapeutic Exercise:  Standing BUE support //bars: red theraband around ankles -  mirror to facilitate upright posture  Alternating abduction including eccentric control 10 reps  Alternating stepping forward with visual target to maintain proper step width 10 reps Standing with back to door frame with rollator support working on upright posture reaching single UE overhead / eyes looking forward 2 deep breathes 2 reps ea UE. PT recommended >/= 3x/day.  Standing with posterior pelvis to counter top with locked rollator walker hands on counter with cues for upright posture.  PT recommended 1x/day trying to increase time.    01/03/2022 Prosthetic Training with left Transtibial Prosthesis & right double upright AFO: Pt ambulated in /out of PT with rollator walker with verbal cues for RLE foot position / not adducting and upright posture looking forward.  Long discussion with patient & wife on LTGs with focus on SAFETY and progressing mobility for community level.  Kasandra Knudsen places him at [place] risk of falls and increased energy expenditure.  Rollator or RW is safer and enables more mobility for community distances & barriers. Pt & wife verbalized understanding and pt to consider discussion topic. Pt's residual limb has no openings and bruising has subsided. He reports less pain.  Therapeutic exercise: Standing BUE support //bars: red theraband around ankles -  mirror to  facilitate upright posture  Alternating abduction including eccentric control 10 reps  Alternating stepping forward with visual target to maintain proper step width 10 reps    12/31/2021 Prosthetic Training with left Transtibial Prosthesis & right double upright AFO: Distal lateral tibia has bruised area ~1cm along with slight edema to area. He also has precallous changes to fibula head area with some discoloration & roughness to skin. Prosthetist added pretibial pads with increased thickness distally and thin popliteal pad.  There is a palpable lip at distal socket near area of bruising but would have been present prior to bruise. However this is in area of pain that he has been reporting.   Pt & wife questioning options for not using pelite secondary interface as less pain when they leave it off.  PT educated that ply socks would have to increase to keep his limb seated  correct depth in socket.  PT recommended discussing with [facility].  PT educated that typically can do socket revision at 6 months and could switch systems at that time.  He may benefit from Vacuum Pin Suction system and recommended looking closer prior. Pt & wife verbalized understanding.  PT called prosthetist & left voicemail regarding new skin issues and recommended pt's wife call back if they have not heard from him by noon.  PT reviewed aligning pin with recommendation to focus on where circular umbrella of liner contacts limb prior to rolling it up. Pt & wife verbalized      HOME EXERCISE PROGRAM:   11/22/21 1244  PT Education  Education Details HEP at sink  Person(s) Educated Patient;Spouse  Methods Explanation;Demonstration;Tactile cues;Verbal cues;Handout  Comprehension Verbalized understanding;Returned demonstration;Verbal cues required;Tactile cues required;Need further instruction    Do each exercise 1-2  times per day Do each exercise 5-10 repetitions Hold each exercise for 2 seconds to feel your  location  AT [place].  Try to find this position when standing still for activities.   USE TAPE ON FLOOR TO MARK THE MIDLINE POSITION which is even with middle of sink.  You also should try to feel with your limb pressure in socket.  You are trying to feel with limb what you used to feel with the bottom of your foot.  Side to Side Shift: Moving your hips only (not shoulders): move weight onto your left leg, HOLD/FEEL pressure in socket.  Move back to equal weight on each leg, HOLD/FEEL pressure in socket. Move weight onto your right leg, HOLD/FEEL pressure in socket. Move back to equal weight on each leg, HOLD/FEEL pressure in socket. Repeat.  Start with both hands on sink, then right hand only, then left hand only Front to Back Shift: Moving your hips only (not shoulders): move your weight forward onto your toes, HOLD/FEEL pressure in socket. Move your weight back to equal Flat Foot on both legs, HOLD/FEEL  pressure in socket. Move your weight back onto your heels, HOLD/FEEL  pressure in socket. Move your weight back to equal on both legs, HOLD/FEEL  pressure in socket. Repeat.  Start with both hands on sink, then right hand only, then left hand only  Moving Cones / Cups: With equal weight on each leg: Hold on with one hand the first time, then progress to no hand supports. Move cups from one side of sink to the other. Place cups ~2" out of your reach, progress to 10" beyond reach.  Place one hand in middle of sink and reach with other hand. Do both arms.  Then hover one hand and move cups with other hand.  Overhead/Upward Reaching: alternated reaching up to top cabinets or ceiling if no cabinets present. Keep equal weight on each leg. Start with one hand support on counter while other hand reaches and progress to no hand support with reaching.  ace one hand in middle of sink and reach with other hand. Do both arms.   Looking Over  Shoulders: With equal weight on each leg: alternate turning to look over your shoulders with one hand support on counter as needed.  Start with head motions only to look in front of shoulder, then even with shoulder and progress to looking behind you. To look to side, move head /eyes, then shoulder on side looking pulls back, shift more weight to side looking and pull hip back. Place  one hand in middle of sink and let go with other hand so your shoulder can pull back. Switch hands to look other way. If looking right, use left hand at sink. If looking left, use right hand at sink. 6.  Stepping with leg tapping forefoot into lower cabinet:  Move items under cabinet out of your way. Shift your hips/pelvis so weight on right side. Tighten muscles in hip on right side.  SLOWLY step left leg so front of foot is in cabinet. Then step back to floor.  Repeat with other leg    ASSESSMENT:   CLINICAL IMPRESSION: PT met all updated STGs. Pain continues to improve & is limiting his mobility less. He reports improved mobility within home & basic community.  He continues to benefit from skilled PT.    OBJECTIVE IMPAIRMENTS Abnormal gait, decreased activity tolerance, decreased balance, decreased coordination, decreased endurance, decreased knowledge of use of DME, decreased mobility, difficulty walking, decreased ROM, decreased strength, decreased safety awareness, increased edema, impaired flexibility, postural dysfunction, prosthetic dependency , and pain.    ACTIVITY LIMITATIONS bending, sitting, standing, squatting, stairs, transfers, bathing, dressing, hygiene/grooming, locomotion level, and prosthetic use   PARTICIPATION LIMITATIONS: cleaning, laundry, and community activity   PERSONAL FACTORS Fitness, Time since onset of injury/illness/exacerbation, and 3+ comorbidities: see PMH  are also affecting patient's functional outcome.    REHAB POTENTIAL: Good   CLINICAL DECISION MAKING: Evolving/moderate  complexity   EVALUATION COMPLEXITY: Moderate     GOALS: Goals reviewed with patient? Yes  UPDATED SHORT TERM GOALS: Target date: 01/11/22  Patient independent with donning/doffing prosthesis. Baseline: see objective data Goal status: MET 01/07/2022  Patient able to transfer sit to/from stand chair without armrests with RW with supervision. Baseline: see objective data Goal status: MET 01/07/2022  Patient ambulates 100' with rollator & prosthesis / AFO with supervision including ramp and curb. Baseline: see objective data Goal status: MET 01/07/2022  LONG TERM GOALS: Target date: 02/07/22   Patient wears prosthesis > 90% of waking hours with no limb pain or skin issues to promote function and safety. Baseline: see objective data Goal status: INITIAL   2.  Patient independent with all aspects of prosthetic care to enable safe utilization of prosthesis. Baseline: see objective data Goal status: INITIAL   3.  Patient ambulates 87' with LRAD safely and modified independent to allow for household mobility. Baseline: see objective data Goal status: INITIAL   4.  Patient able to ambulate 200' and navigate ramp and curb with LRAD safely with minA/ caregiver assist to allow for community mobility. Baseline: see objective data Goal status: INITIAL   5.  Berg balance score >/= 30/56 with RW to indicate low fall risk during household level balance requirements with UE support. Baseline: see objective data Goal status: INITIAL     PLAN: PT FREQUENCY: 2x/week   PT DURATION: other: 90 days   PLANNED INTERVENTIONS: Therapeutic exercises, Therapeutic activity, Neuromuscular re-education, Balance training, Gait training, Patient/Family education, Self Care, Stair training, Vestibular training, Orthotic/Fit training, Prosthetic training, DME instructions, Moist heat, scar mobilization, Manual therapy, Re-evaluation, and physical performance tests   PLAN FOR NEXT SESSION:   work on standing  balance activities & exercises. Work on functional gait progressing distance and scanning while ambulating.   Jamey Reas, PT, DPT 01/07/2022, 12:54 PM

## 2022-01-08 ENCOUNTER — Inpatient Hospital Stay: Payer: Medicare Other | Attending: Oncology | Admitting: Oncology

## 2022-01-08 ENCOUNTER — Other Ambulatory Visit: Payer: Self-pay

## 2022-01-08 VITALS — BP 140/61 | HR 75 | Temp 97.8°F | Resp 17 | Ht 71.0 in | Wt 197.3 lb

## 2022-01-08 DIAGNOSIS — D631 Anemia in chronic kidney disease: Secondary | ICD-10-CM | POA: Diagnosis not present

## 2022-01-08 DIAGNOSIS — D696 Thrombocytopenia, unspecified: Secondary | ICD-10-CM

## 2022-01-08 DIAGNOSIS — Z992 Dependence on renal dialysis: Secondary | ICD-10-CM

## 2022-01-08 DIAGNOSIS — N186 End stage renal disease: Secondary | ICD-10-CM

## 2022-01-08 NOTE — Progress Notes (Signed)
Reason for the request:   Thrombocytopenia  HPI: I was asked by Carl Best NP   to evaluate [name] for leukocytopenia.  He is a 71 year old man with history of coronary artery disease, hypertension and peripheral neuropathy.  He is dialysis dependent with peripheral vascular disease and amputation on the left side.  He was evaluated by GI for decreased appetite and nausea.  Laboratory data on 12/20/2021 showed a platelet count of 77 hemoglobin of 10.1 and white cell count of 4.8.  His hemoglobin has a fluctuated between 9 and 11 and platelet count was 113 in May 2023.  His platelet count was as low as 95 in February 2023 and increased to 168 spontaneously.  His platelet count were 89 in 2021 and responded spontaneously to normal range.  That number was 105 in 2017.  Clinically, he is not necessarily symptomatic from these findings.  He denies any bleeding complications.  He denies any hematochezia, melena or hemoptysis.  He denies any hospitalizations or illnesses.   He does not report any headaches, blurry vision, syncope or seizures. Does not report any fevers, chills or sweats.  Does not report any cough, wheezing or hemoptysis.  Does not report any chest pain, palpitation, orthopnea or leg edema.  Does not report any nausea, vomiting or abdominal pain.  Does not report any constipation or diarrhea.  Does not report any skeletal complaints.    Does not report frequency, urgency or hematuria.  Does not report any skin rashes or lesions. Does not report any heat or cold intolerance.  Does not report any lymphadenopathy or petechiae.  Does not report any anxiety or depression.  Remaining review of systems is negative.     Past Medical History:  Diagnosis Date   Ambulates with cane    straight cane   Anemia    Cervical myelopathy (HCC) 02/06/2018   Chronic kidney disease    dailysis [facility]- home   Complication of anesthesia    Coronary artery disease    Diabetes mellitus without  complication ([place])    type2   Diabetic foot ulcer ([place])    Diabetic neuropathy ([place]) 02/06/2018   Gait abnormality 08/24/2018   GERD (gastroesophageal reflux disease)     01/06/20- not current   Gout    History of blood transfusion    History of blood transfusion    History of kidney stones    passed stones   Hypercholesteremia    Hypertension    Hypothyroidism    Neuromuscular disorder (HCC)    neuropathy left leg and bilateral feet   Neuropathy    PONV (postoperative nausea and vomiting)    Prostate cancer (HCC)    PVD (peripheral vascular disease) ([place])    with amputations  :   Past Surgical History:  Procedure Laterality Date   A-FLUTTER ABLATION N/A 04/06/2020   Procedure: A-FLUTTER ABLATION;  Surgeon: [name], MD;  Location: [facility];  Service: Cardiovascular;  Laterality: N/A;   AMPUTATION Left 12/25/2013   Procedure: AMPUTATION RAY LEFT 5TH RAY;  Surgeon: [name], MD;  Location: [facility];  Service: Orthopedics;  Laterality: Left;   AMPUTATION Right 12/15/2018   Procedure: AMPUTATION OF 4TH AND 5TH TOES RIGHT FOOT;  Surgeon: [name], MD;  Location: [facility];  Service: Orthopedics;  Laterality: Right;   AMPUTATION Left 02/02/2021   Procedure: LEFT FOOT 4TH RAY AMPUTATION;  Surgeon: [name], MD;  Location: [facility];  Service: Orthopedics;  Laterality: Left;   AMPUTATION Left 05/09/2021   Procedure: LEFT BELOW KNEE AMPUTATION;  Surgeon: [name], MD;  Location: [facility];  Service: Orthopedics;  Laterality: Left;   APPLICATION OF WOUND VAC Right 12/15/2018   Procedure: APPLICATION OF WOUND VAC;  Surgeon: [name], MD;  Location: [facility];  Service: Orthopedics;  Laterality: Right;   AV FISTULA PLACEMENT Left 03/25/2019   Procedure: LEFT ARM ARTERIOVENOUS (AV) FISTULA CREATION;  Surgeon: [name], MD;  Location: [facility];  Service: Vascular;  Laterality: Left;   [place] Left 05/20/2019   Procedure:  SECOND STAGE LEFT BASCILIC VEIN TRANSPOSITION;  Surgeon: [name], MD;  Location: [facility];  Service: Vascular;  Laterality: Left;   CARDIAC CATHETERIZATION  02/17/2014   CHOLECYSTECTOMY     COLONOSCOPY  2011   in [place], [name]  2008   CYSTOSCOPY N/A 06/29/2020   Procedure: [place];  Surgeon: [name], MD;  Location: [facility];  Service: Urology;  Laterality: N/A;   CYSTOSCOPY WITH FULGERATION Bilateral 06/17/2020   Procedure: CYSTOSCOPY,BILATERAL RETROGRADE, CLOT EVACUATION WITH FULGERATION OF THE BLADDER;  Surgeon: Janith Lima, MD;  Location: [facility];  Service: Urology;  Laterality: Bilateral;   CYSTOSCOPY WITH FULGERATION N/A 06/28/2020   Procedure: CYSTOSCOPY WITH CLOT EVACUATION AND FULGERATION OF BLEEDERS;  Surgeon: [name], MD;  Location: [facility];  Service: Urology;  Laterality: N/A;  1 HR   I & D EXTREMITY Right 12/15/2018   Procedure: DEBRIDEMENT RIGHT FOOT;  Surgeon: [name], MD;  Location: [facility];  Service: Orthopedics;  Laterality: Right;   I & D EXTREMITY Right 08/27/2019   Procedure: PARTIAL CUBOID EXCISION RIGHT FOOT;  Surgeon: [name], MD;  Location: [facility];  Service: Orthopedics;  Laterality: Right;   I & D EXTREMITY Right 01/07/2020   Procedure: RIGHT FOOT EXCISION INFECTED BONE;  Surgeon: [name], MD;  Location: [facility];  Service: Orthopedics;  Laterality: Right;   NECK SURGERY     novemver 2019   STUMP REVISION Left 06/27/2021   Procedure: REVISION LEFT BELOW KNEE AMPUTATION;  Surgeon: [name], MD;  Location: [facility];  Service: Orthopedics;  Laterality: Left;   TRANSURETHRAL RESECTION OF PROSTATE N/A 06/28/2020   Procedure: TRANSURETHRAL RESECTION OF THE PROSTATE (TURP);  Surgeon: [name], MD;  Location: [facility];  Service: Urology;  Laterality: N/A;   WISDOM TOOTH EXTRACTION    :   Current Outpatient Medications:    acetaminophen (TYLENOL) 500 MG  tablet, Take 1,000 mg by mouth every 8 (eight) hours as needed for mild pain or headache., Disp: , Rfl:    allopurinol (ZYLOPRIM) 100 MG tablet, Take 2 tablets (200 mg total) by mouth daily., Disp: 30 tablet, Rfl: 0   B Complex-C-Zn-Folic Acid (DIALYVITE 563 WITH ZINC) 0.8 MG TABS, Take 1 tablet by mouth daily. (Patient not taking: Reported on 09/25/2021), Disp: 30 tablet, Rfl: 0   calcitRIOL (ROCALTROL) 0.5 MCG capsule, Take 1 capsule (0.5 mcg total) by mouth daily. (Patient not taking: Reported on 09/25/2021), Disp: 30 capsule, Rfl: 0   carvedilol (COREG) 12.5 MG tablet, TAKE 1 TABLET BY MOUTH IN THE MORNING AND AT BEDTIME., Disp: 180 tablet, Rfl: 3   Darbepoetin Alfa (ARANESP) 100 MCG/0.5ML SOSY injection, Inject 0.5 mLs (100 mcg total) into the vein every Thursday with hemodialysis. (Patient not taking: Reported on 09/25/2021),  Disp: 4.2 mL, Rfl:    famotidine-calcium carbonate-magnesium hydroxide (PEPCID COMPLETE) 10-800-165 MG chewable tablet, Chew 1 tablet by mouth daily as needed (heartburn)., Disp: , Rfl:    Insulin Pen Needle 32G X 4 MM MISC, Use to inject insulin up to 4 times daily as needed., Disp: 100 each, Rfl: 0   levothyroxine (SYNTHROID) 112 MCG tablet, Take 1 tablet (112 mcg total) by mouth daily before breakfast., Disp: 30 tablet, Rfl: 0   losartan (COZAAR) 100 MG tablet, Take 1 tablet (100 mg total) by mouth daily., Disp: 30 tablet, Rfl: 0   methocarbamol (ROBAXIN) 500 MG tablet, Take 1 tablet (500 mg total) by mouth 3 (three) times daily. (Patient taking differently: Take 500 mg by mouth every 8 (eight) hours as needed for muscle spasms.), Disp: 30 tablet, Rfl: 0   Methoxy PEG-Epoetin Beta (MIRCERA IJ), Inject into the skin., Disp: , Rfl:    modafinil (PROVIGIL) 200 MG tablet, Take 1 tablet (200 mg total) by mouth daily. (Patient not taking: Reported on 12/20/2021), Disp: 90 tablet, Rfl: 3   OZEMPIC, 0.25 OR 0.5 MG/DOSE, 2 MG/3ML SOPN, Inject 0.5 mg into the skin once a week., Disp: ,  Rfl:    pantoprazole (PROTONIX) 40 MG tablet, Take 1 tablet (40 mg total) by mouth daily. (Patient taking differently: Take 40 mg by mouth every evening.), Disp: 30 tablet, Rfl: 0   Polyethyl Glycol-Propyl Glycol (LUBRICANT EYE DROPS) 0.4-0.3 % SOLN, Place 1-2 drops into both eyes in the morning and at bedtime., Disp: , Rfl:    pregabalin (LYRICA) 150 MG capsule, Take 1 capsule by mouth in the morning and 2 capsules at night., Disp: 90 capsule, Rfl: 0   prochlorperazine (COMPAZINE) 5 MG tablet, TAKE 1 TABLET BY MOUTH TWICE A DAY, Disp: 60 tablet, Rfl: 3   rosuvastatin (CRESTOR) 10 MG tablet, Take 1 tablet (10 mg total) by mouth daily., Disp: 30 tablet, Rfl: 0   Semaglutide,0.25 or 0.'5MG'$ /DOS, (OZEMPIC, 0.25 OR 0.5 MG/DOSE,) 2 MG/1.5ML SOPN, Inject 0.5 mg into the skin every Friday. (Patient taking differently: Inject 0.5 mg into the skin every Saturday at 6 PM.), Disp: 1.5 mL, Rfl: 0   sevelamer carbonate (RENVELA) 800 MG tablet, Take 1 tablet (800 mg total) by mouth 3 (three) times daily with meals., Disp: 90 tablet, Rfl: 0   Vitamin D, Ergocalciferol, (DRISDOL) 1.25 MG (50000 UNIT) CAPS capsule, Take 1 capsule (50,000 Units total) by mouth every 7 (seven) days., Disp: 7 capsule, Rfl: 3:   Allergies  Allergen Reactions   Morphine     Other reaction(s): Delusions (intolerance)   Mushroom Extract Complex Nausea Only  :   Family History  Problem Relation Age of Onset   Diabetes Mellitus II Mother    Kidney disease Mother    Diabetes Mellitus II Father    CAD Father    Cancer Father        prostate   Kidney disease Father    Diabetes Mellitus II Brother    Kidney disease Brother    Diabetes Mellitus II Brother    Stomach cancer Brother 53   Kidney disease Brother    Colon cancer Neg Hx    Colon polyps Neg Hx    Esophageal cancer Neg Hx    Rectal cancer Neg Hx    Pancreatic cancer Neg Hx   :   Social History   Socioeconomic History   Marital status: Married    Spouse name: Not  on file   Number of  children: 2   Years of education: Not on file   Highest education level: Not on file  Occupational History   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  Tobacco Use   Smoking status: Former    Types: Cigars    Start date: 04/08/1973    Quit date: 12/24/1988    Years since quitting: 33.0   Smokeless tobacco: Never   Tobacco comments:    Cigars and Pipe   Vaping Use   Vaping Use: Never used  Substance and Sexual Activity   Alcohol use: Never   Drug use: Never   Sexual activity: Yes    Partners: Female  Other Topics Concern   Not on file  Social History Narrative   Regular exercise: yes 3 times a week   Caffeine use: hot tea   Social Determinants of Health   Financial Resource Strain: Not on file  Food Insecurity: No Food Insecurity (11/02/2021)   Hunger Vital Sign    Worried About Running Out of Food in the Last Year: Never true    Ran Out of Food in the Last Year: Never true  Transportation Needs: No Transportation Needs (11/02/2021)   PRAPARE - Hydrologist (Medical): No    Lack of Transportation (Non-Medical): No  Physical Activity: Not on file  Stress: Not on file  Social Connections: Not on file  Intimate Partner Violence: Not on file  :  Pertinent items are noted in HPI.  Exam: There were no vitals taken for this visit. General appearance: alert and cooperative appeared without distress. Head: atraumatic without any abnormalities. Eyes: conjunctivae/corneas clear. PERRL.  Sclera anicteric. Throat: lips, mucosa, and tongue normal; without oral thrush or ulcers. Resp: clear to auscultation bilaterally without rhonchi, wheezes or dullness to percussion. Cardio: regular rate and rhythm, S1, S2 normal, no murmur, click, rub or gallop GI: soft, non-tender; bowel sounds normal; no masses,  no organomegaly Skin: Erythematous discoloration noted on his right lower extremity. Lymph nodes: Cervical, supraclavicular, and axillary  nodes normal. Neurologic: Grossly normal without any motor, sensory or deep tendon reflexes. Musculoskeletal: No joint deformity or effusion.    US Abdomen Complete  Result Date: 12/28/2021 CLINICAL DATA:  Thrombocytopenia. EXAM: ABDOMEN ULTRASOUND COMPLETE COMPARISON:  CT abdomen pelvis 05/05/2020 FINDINGS: Gallbladder: Surgically absent Common bile duct: Diameter: 4.8 mm Liver: Heterogeneous hepatic parenchyma. No focal lesion. Small amount of perihepatic ascites. Portal vein is patent on color Doppler imaging with normal direction of blood flow towards the liver. IVC: No abnormality visualized. Pancreas: Visualized portion unremarkable. Spleen: Size and appearance within normal limits. Small adjacent splenule. Right Kidney: Length: 10.1 cm. Normal renal cortical thickness and echogenicity. No hydronephrosis. There is a 3.1 x 2.3 x 2.5 cm cyst within the superior pole. Left Kidney: Length: 8.0 cm. Echogenicity within normal limits. No mass or hydronephrosis visualized. Abdominal aorta: No aneurysm visualized. Other findings: Small volume ascites. IMPRESSION: 1. Small volume perihepatic ascites. 2. Prior cholecystectomy. 3. No acute process. Electronically Signed   By: Lovey Newcomer M.D.   On: 12/28/2021 06:42    Assessment and Plan:    71 year old with:  1.  Thrombocytopenia with fluctuating counts dating back to at least 2017 that is overall mild and likely reactive in nature.  His platelet counts was never below 70 and no active bleeding noted.  Differential diagnosis was reviewed at this time.  Reactive thrombocytopenia remains the most likely etiology.  ITP, medication effect among others could be considered.  Other conditions such as  myelodysplastic syndrome, infiltrative bone marrow disorder among others are considered unlikely.  TTP, HUS, DIC are also unlikely.  At this time, I have recommended continued observation without intervention.  He is asymptomatic with mild thrombocytopenia that  has fluctuated close to normal range for many years at this time.  He had multiple imaging studies including ultrasound of the abdomen as well as CT scan of the last year which did not show any evidence to suggest malignancy.   2.  Anemia: This is related to his kidney disease and currently receiving growth factor support.  No intervention is needed.  3.  Follow-up: I am happy to see him in the future as needed.   45  minutes were dedicated to this visit. The time was spent on reviewing laboratory data, imaging studies, discussing treatment options, discussing differential diagnosis and answering questions regarding future plan.      A copy of this consult has been forwarded to the requesting provider.

## 2022-01-10 ENCOUNTER — Ambulatory Visit (INDEPENDENT_AMBULATORY_CARE_PROVIDER_SITE_OTHER): Payer: Medicare Other | Admitting: Physical Therapy

## 2022-01-10 ENCOUNTER — Encounter: Payer: Self-pay | Admitting: Physical Therapy

## 2022-01-10 DIAGNOSIS — M6281 Muscle weakness (generalized): Secondary | ICD-10-CM | POA: Diagnosis not present

## 2022-01-10 DIAGNOSIS — R2681 Unsteadiness on feet: Secondary | ICD-10-CM | POA: Diagnosis not present

## 2022-01-10 DIAGNOSIS — R2689 Other abnormalities of gait and mobility: Secondary | ICD-10-CM

## 2022-01-10 DIAGNOSIS — R293 Abnormal posture: Secondary | ICD-10-CM

## 2022-01-10 DIAGNOSIS — M79605 Pain in left leg: Secondary | ICD-10-CM

## 2022-01-10 NOTE — Therapy (Signed)
OUTPATIENT PHYSICAL THERAPY TREATMENT NOTE    Patient Name: [name] MRN: [id number] DOB:1950-12-11, 71 y.o., male Today's Date: 01/10/2022  PCP: Lorenda Hatchet, FNP REFERRING PROVIDER: [name], MD  END OF SESSION:   PT End of Session - 01/10/22 1222     Visit Number 17    Number of Visits 25    Date for PT Re-Evaluation 02/07/22    Authorization Type Medicare and BCBS    Progress Note Due on Visit 52    PT Start Time 1148    PT Stop Time 1230    PT Time Calculation (min) 42 min    Equipment Utilized During Treatment Gait belt    Activity Tolerance Patient limited by fatigue;Patient limited by pain    Behavior During Therapy WFL for tasks assessed/performed                Past Medical History:  Diagnosis Date   Ambulates with cane    straight cane   Anemia    Cervical myelopathy ([place]) 02/06/2018   Chronic kidney disease    dailysis M W F- home   Complication of anesthesia    Coronary artery disease    Diabetes mellitus without complication ([place])    type2   Diabetic foot ulcer ([name])    Diabetic neuropathy ([place]) 02/06/2018   Gait abnormality 08/24/2018   GERD (gastroesophageal reflux disease)     01/06/20- not current   Gout    History of blood transfusion    History of blood transfusion    History of kidney stones    passed stones   Hypercholesteremia    Hypertension    Hypothyroidism    Neuromuscular disorder ([place])    neuropathy left leg and bilateral feet   Neuropathy    PONV (postoperative nausea and vomiting)    Prostate cancer ([place])    PVD (peripheral vascular disease) ([place])    with amputations   Past Surgical History:  Procedure Laterality Date   A-FLUTTER ABLATION N/A 04/06/2020   Procedure: A-FLUTTER ABLATION;  Surgeon: [name], MD;  Location: [facility];  Service: Cardiovascular;  Laterality: N/A;   AMPUTATION Left 12/25/2013   Procedure: AMPUTATION RAY LEFT 5TH RAY;  Surgeon: [name], MD;  [facility]: [facility];  Service: Orthopedics;  Laterality: Left;   AMPUTATION Right 12/15/2018   Procedure: AMPUTATION OF 4TH AND 5TH TOES RIGHT FOOT;  Surgeon: [name], MD;  Location: [facility];  Service: Orthopedics;  Laterality: Right;   AMPUTATION Left 02/02/2021   Procedure: LEFT FOOT 4TH RAY AMPUTATION;  Surgeon: [name], MD;  Location: [facility];  Service: Orthopedics;  Laterality: Left;   AMPUTATION Left 05/09/2021   Procedure: LEFT BELOW KNEE AMPUTATION;  Surgeon: [name], MD;  Location: [facility];  Service: Orthopedics;  Laterality: Left;   APPLICATION OF WOUND VAC Right 12/15/2018   Procedure: APPLICATION OF WOUND VAC;  Surgeon: [name], MD;  Location: [facility];  Service: Orthopedics;  Laterality: Right;   AV FISTULA PLACEMENT Left 03/25/2019   Procedure: LEFT ARM ARTERIOVENOUS (AV) FISTULA CREATION;  Surgeon: [name], MD;  Location: [facility];  Service: Vascular;  Laterality: Left;   [place] Left 05/20/2019   Procedure: SECOND STAGE LEFT BASCILIC VEIN TRANSPOSITION;  Surgeon: [name], MD;  Location: [facility];  Service: Vascular;  Laterality: Left;   CARDIAC CATHETERIZATION  02/17/2014   CHOLECYSTECTOMY  COLONOSCOPY  2011   in [place], Normal   CORONARY ARTERY BYPASS GRAFT  2008   CYSTOSCOPY N/A 06/29/2020   Procedure: CYSTOSCOPY WITH CLOT EVACUATION AND  FULGERATION;  Surgeon: [name], MD;  Location: [facility];  Service: Urology;  Laterality: N/A;   CYSTOSCOPY WITH FULGERATION Bilateral 06/17/2020   Procedure: CYSTOSCOPY,BILATERAL RETROGRADE, CLOT EVACUATION WITH FULGERATION OF THE BLADDER;  Surgeon: Janith Lima, MD;  Location: [facility];  Service: Urology;  Laterality: Bilateral;   CYSTOSCOPY WITH FULGERATION N/A 06/28/2020   Procedure: CYSTOSCOPY WITH CLOT EVACUATION AND FULGERATION OF BLEEDERS;  Surgeon: [name], MD;  Location: [facility];  Service: Urology;  Laterality: N/A;  1 HR   I & D EXTREMITY Right 12/15/2018    Procedure: DEBRIDEMENT RIGHT FOOT;  Surgeon: [name], MD;  Location: [facility];  Service: Orthopedics;  Laterality: Right;   I & D EXTREMITY Right 08/27/2019   Procedure: PARTIAL CUBOID EXCISION RIGHT FOOT;  Surgeon: [name], MD;  Location: [facility];  Service: Orthopedics;  Laterality: Right;   I & D EXTREMITY Right 01/07/2020   Procedure: RIGHT FOOT EXCISION INFECTED BONE;  Surgeon: [name], MD;  Location: [facility];  Service: Orthopedics;  Laterality: Right;   NECK SURGERY     novemver 2019   STUMP REVISION Left 06/27/2021   Procedure: REVISION LEFT BELOW KNEE AMPUTATION;  Surgeon: [name], MD;  Location: [facility];  Service: Orthopedics;  Laterality: Left;   TRANSURETHRAL RESECTION OF PROSTATE N/A 06/28/2020   Procedure: TRANSURETHRAL RESECTION OF THE PROSTATE (TURP);  Surgeon: [name], MD;  Location: [facility];  Service: Urology;  Laterality: N/A;   WISDOM TOOTH EXTRACTION     Patient Active Problem List   Diagnosis Date Noted   Community acquired pneumonia of left lower lobe of lung 08/18/2021   Paroxysmal atrial fibrillation ([place]) 08/18/2021   Anemia of chronic renal failure 08/18/2021   Thrombocytopenia ([place]) 62/83/1517   Acute metabolic encephalopathy 61/60/7371   Action induced myoclonus 08/18/2021   Dehiscence of amputation stump of left lower extremity ([place])    Left below-knee amputee ([place]) 05/11/2021   S/P BKA (below knee amputation) unilateral, left ([place]) 05/09/2021   Partial nontraumatic amputation of foot, left ([place]) 02/20/2021   Abscess of bursa of left foot 02/03/2021   Cutaneous abscess of left foot    Acute osteomyelitis of metatarsal bone of left foot (H[facility])    Symptomatic anemia 09/13/2020   COVID-19 virus infection 09/13/2020   Pressure injury of skin 06/29/2020   Gross hematuria 06/16/2020   Typical atrial flutter ([place]) 03/24/2020   Bilateral pleural effusion 02/24/2020   Healthcare maintenance 01/28/2020   Shortness of breath 01/28/2020    History of partial ray amputation of fourth toe of right foot ([place]) 09/08/2019   Ulcerated, foot, right, with necrosis of bone (HCC)    Chronic cough 06/25/2019   Cutaneous abscess of right foot    Subacute osteomyelitis of right foot ([place]) 12/14/2018   AKI (acute kidney injury) ([place]) 12/14/2018   ESRD on hemodialysis ([address]) 12/14/2018   Gait abnormality 08/24/2018   Diabetic neuropathy ([place]) 02/06/2018   Cervical myelopathy ([place]) 02/06/2018   Onychomycosis 10/30/2017   Other spondylosis with radiculopathy, cervical region 01/27/2017   Midfoot ulcer, right, limited to breakdown of skin ([place]) 11/15/2016   Lateral epicondylitis, left elbow 08/12/2016   Cellulitis of fifth toe of right foot 07/29/2016   Prostate cancer ([place]) 06/19/2016   Non-pressure chronic ulcer of other part  of right foot limited to breakdown of skin ([place]) 04/04/2016   Cellulitis of leg, right 12/24/2015   Cellulitis of right lower extremity 12/24/2015   Gout 09/14/2012   Hypertension associated with diabetes ([place]) 09/14/2012   Hypothyroidism 09/14/2012   CAD (coronary artery disease) 09/14/2012   Insulin dependent type 2 diabetes mellitus ([place]) 09/14/2012   Hyperlipidemia associated with type 2 diabetes mellitus ([place])    Neuropathy ([place])     REFERRING DIAG: H47.425Z (ICD-10-CM) - Below-knee amputation of left lower extremity  THERAPY DIAG:  Unsteadiness on feet  Other abnormalities of gait and mobility  Abnormal posture  Muscle weakness (generalized)  Pain in left leg  Rationale for Evaluation and Treatment Rehabilitation  PERTINENT HISTORY: braced right foot with cavovarus collapse, CAP without hypoxia, acute metabolic encephalopathy, ESRD on HD Sun/W/F, afib s/p ablation, DM II, neuropathy, HTN, HLD, hypothyroidism, anemia, prostate cancer s/p TUPP. cervical myelopathy, gout, CAD w/CABG 2005  PRECAUTIONS: no BP in LUE, fall risk   SUBJECTIVE: He is having more overall left shin pain today limiting his  walking and standing. He will see prosthetist this afternoon about it.  PAIN:  Are you having pain?  Yes seated 1/10,  standing 1-2/10, walking 2-3/10 increasing with distance, highest since last PT 5-6/10 Location: pain in distal lateral shin  Aggravating factor: wearing & weight bearing on prosthesis  Alleviating factor: removing prosthesis   OBJECTIVE: (objective measures completed at initial evaluation unless otherwise dated) COGNITION: Overall cognitive status: Within functional limits for tasks assessed              SENSATION: Not tested   POSTURE: rounded shoulders, forward head, and anterior pelvic tilt - RLE resting position with tibial IR, severe foot supination and inversion with cavovarus collapse  PALPATION 12/17/21 Tenderness to palpation at distal lateral tibia, potential bone spur formation at distal lateral tibia   LOWER EXTREMITY ROM:   ROM (A: AROM, P: PROM) Right eval Left eval  Hip flexion      Hip extension      Hip abduction      Hip adduction      Hip internal rotation      Hip external rotation      Knee flexion      Knee extension   Standing with RW and prosthesis  A: -16*  Ankle dorsiflexion      Ankle plantarflexion      Ankle inversion      Ankle eversion       (Blank rows = not tested)   LOWER EXTREMITY MMT:   MMT Right eval Left eval  Hip flexion      Hip extension      Hip abduction      Hip adduction      Hip internal rotation      Hip external rotation      Knee flexion      Knee extension      Ankle dorsiflexion      Ankle plantarflexion      Ankle inversion      Ankle eversion      (Blank rows = not tested)   Functional strength with standing and transfers 3- at hip and knee bil., with no motion at Rt ankle   TRANSFERS: Sit to stand: minA - fell backwards into chair upon first attempt, MinA for next attempt with RW stabilization and verbal cueing for foot placement Stand to sit: minA for controlling descent and  stabilization   GAIT:  Gait pattern:  adduction RLE with toe-in, step to pattern, decreased step length- Right, decreased stance time- Right, decreased stride length, decreased hip/knee flexion- Left, decreased ankle dorsiflexion- Right, Left hip hike, Right foot flat, knee flexed in stance- Right, knee flexed in stance- Left, lateral hip instability, lateral lean- Right, trunk flexed, and poor foot clearance- Right Distance walked: 35' Assistive device utilized: Environmental consultant - 2 wheeled & Left TTA prosthesis, right double upright AFO custom shoe Level of assistance: MinA to recover losses of balance with straight surface walking, ModA to recover losses of balance turning Lt, MaxA with loss of balance during turning Rt Comments: Rt lower leg positioning reduced foot clearance with catching Lt heel most steps, and promoted Rt lateral leaning/lateral loss of balance. Requires excessive BUE support on RW.   FUNCTIONAL TESTs:  Berg Balance Scale: 6/56, 13/56 with RW   [facility] PT Assessment - 11/13/21 0001                Standardized Balance Assessment    Standardized Balance Assessment Berg Balance Test          Berg Balance Test    Sit to Stand Needs moderate or maximal assist to stand   Berg task with RW = 1    Standing Unsupported Unable to stand 30 seconds unassisted   Berg task with RW = 2    Sitting with Back Unsupported but Feet Supported on Floor or Stool Able to sit safely and securely 2 minutes     Stand to Sit Needs assistance to sit   Berg task with RW = 2    Transfers Able to transfer with verbal cueing and /or supervision     Standing Unsupported with Eyes Closed Needs help to keep from falling   Berg task with RW = 0    Standing Unsupported with Feet Together Needs help to attain position and unable to hold for 15 seconds   Berg task with RW = 0    From Standing, Reach Forward with Outstretched Arm Loses balance while trying/requires external support   Berg task with RW = 1 (CGA reaching  with Lt)    From Standing Position, Pick up Object from Floor Unable to try/needs assist to keep balance   Berg task with RW = 0    From Standing Position, Turn to Look Behind Over each Shoulder Needs assist to keep from losing balance and falling   Berg task with RW = 0    Turn 360 Degrees Needs assistance while turning   Berg task with RW = 0    Standing Unsupported, Alternately Place Feet on Step/Stool Needs assistance to keep from falling or unable to try   Berg task with RW = 0    Standing Unsupported, One Foot in [employer] balance while stepping or standing   Berg task with RW = 0    Standing on One Leg Unable to try or needs assist to prevent fall   Berg task with RW = 1    Total Score 6     Berg comment: Berg tasks with RW = 13/56                     CURRENT PROSTHETIC WEAR ASSESSMENT: Patient is dependent with: skin check, residual limb care, care of non-amputated limb, prosthetic cleaning, ply sock cleaning, correct ply sock adjustment, proper wear schedule/adjustment, and proper weight-bearing schedule/adjustment Donning prosthesis: Min A and maximal cueing Doffing prosthesis: SBA Prosthetic wear  tolerance: 30 minutes 1-2x/day 6 of 6 days since delivery Prosthetic weight bearing tolerance: stood ~5 minutes with c/o tibial crest pain moderate level after weight bearing Edema: present with slow capillary refill Residual limb condition: 1.6 cm scab along incision, cylinderical shape, normal color & temperature, mildly dry skin, slight scar adhesion at distal tibia,  Prosthetic description: silicone liner with pin lock suspension, 2nd pelite foam liner, total contact socket, SACH foot     TODAY'S TREATMENT:  01/10/2022 Prosthetic Training with left Transtibial Prosthesis & right double upright AFO: Pt ambulated >150'  in /out of PT with rollator walker with verbal cues for RLE foot position / not adducting and upright posture looking forward.  Pt donnes & doffes prosthesis  independently Pt neg curb with rollator walker with supervision. Did not perform ramp as this causes too much pain and he relays he can do ramps without difficulty  Therapeutic Exercise:  Standing BUE support //bars: red theraband around ankles -  mirror to facilitate upright posture  Alternating abduction including eccentric control 10 reps  Alternating stepping forward with visual target to maintain proper step width 10 reps Standing one UE support in //bars unilateral upper body rows and chest press with green X 15 each bilat Leg press DL 75# 2X15, then SL 37# on Rt 2X10, 25# on Lt X 8 (stopped due to shin pain and reduced to 12# for 10 more reps on left)  01/07/2022 Prosthetic Training with left Transtibial Prosthesis & right double upright AFO: Pt ambulated >150'  in /out of PT with rollator walker with verbal cues for RLE foot position / not adducting and upright posture looking forward.  Pt donnes & doffes prosthesis independently but verbal cues for initiating liner to minimize pain.  Pt reports that he is in agreement with not ambulating with cane.  Pt neg curb with rollator walker with supervision. He reports ramp (one in PT clinic is 11-12* incline) hurts his limb too much. Pt & wife report able to neg ramps in community.   Therapeutic Exercise:  Standing BUE support //bars: red theraband around ankles -  mirror to facilitate upright posture  Alternating abduction including eccentric control 10 reps  Alternating stepping forward with visual target to maintain proper step width 10 reps Standing with back to door frame with rollator support working on upright posture reaching single UE overhead / eyes looking forward 2 deep breathes 2 reps ea UE. PT recommended >/= 3x/day.  Standing with posterior pelvis to counter top with locked rollator walker hands on counter with cues for upright posture.  PT recommended 1x/day trying to increase time.    01/03/2022 Prosthetic Training with left  Transtibial Prosthesis & right double upright AFO: Pt ambulated in /out of PT with rollator walker with verbal cues for RLE foot position / not adducting and upright posture looking forward.  Long discussion with patient & wife on LTGs with focus on SAFETY and progressing mobility for community level.  Kasandra Knudsen places him at [place] risk of falls and increased energy expenditure.  Rollator or RW is safer and enables more mobility for community distances & barriers. Pt & wife verbalized understanding and pt to consider discussion topic. Pt's residual limb has no openings and bruising has subsided. He reports less pain.  Therapeutic exercise: Standing BUE support //bars: red theraband around ankles -  mirror to facilitate upright posture  Alternating abduction including eccentric control 10 reps  Alternating stepping forward with visual target to maintain proper step width 10  reps    12/31/2021 Prosthetic Training with left Transtibial Prosthesis & right double upright AFO: Distal lateral tibia has bruised area ~1cm along with slight edema to area. He also has precallous changes to fibula head area with some discoloration & roughness to skin. Prosthetist added pretibial pads with increased thickness distally and thin popliteal pad.  There is a palpable lip at distal socket near area of bruising but would have been present prior to bruise. However this is in area of pain that he has been reporting.   Pt & wife questioning options for not using pelite secondary interface as less pain when they leave it off.  PT educated that ply socks would have to increase to keep his limb seated correct depth in socket.  PT recommended discussing with [facility]  PT educated that typically can do socket revision at 6 months and could switch systems at that time.  He may benefit from Vacuum Pin Suction system and recommended looking closer prior. Pt & wife verbalized understanding.  PT called prosthetist & left voicemail  regarding new skin issues and recommended pt's wife call back if they have not heard from him by noon.  PT reviewed aligning pin with recommendation to focus on where circular umbrella of liner contacts limb prior to rolling it up. Pt & wife verbalized      HOME EXERCISE PROGRAM:   11/22/21 1244  PT Education  Education Details HEP at sink  Person(s) Educated Patient;Spouse  Methods Explanation;Demonstration;Tactile cues;Verbal cues;Handout  Comprehension Verbalized understanding;Returned demonstration;Verbal cues required;Tactile cues required;Need further instruction    Do each exercise 1-2  times per day Do each exercise 5-10 repetitions Hold each exercise for 2 seconds to feel your location  AT [place].  Try to find this position when standing still for activities.   USE TAPE ON FLOOR TO MARK THE MIDLINE POSITION which is even with middle of sink.  You also should try to feel with your limb pressure in socket.  You are trying to feel with limb what you used to feel with the bottom of your foot.  Side to Side Shift: Moving your hips only (not shoulders): move weight onto your left leg, HOLD/FEEL pressure in socket.  Move back to equal weight on each leg, HOLD/FEEL pressure in socket. Move weight onto your right leg, HOLD/FEEL pressure in socket. Move back to equal weight on each leg, HOLD/FEEL pressure in socket. Repeat.  Start with both hands on sink, then right hand only, then left hand only Front to Back Shift: Moving your hips only (not shoulders): move your weight forward onto your toes, HOLD/FEEL pressure in socket. Move your weight back to equal Flat Foot on both legs, HOLD/FEEL  pressure in socket. Move your weight back onto your heels, HOLD/FEEL  pressure in socket. Move your weight back to equal on both legs, HOLD/FEEL  pressure in socket. Repeat.  Start with both hands on sink, then right hand only, then  left hand only  Moving Cones / Cups: With equal weight on each leg: Hold on with one hand the first time, then progress to no hand supports. Move cups from one side of sink to the other. Place cups ~2" out of your reach, progress to 10" beyond reach.  Place one hand in middle of sink and reach with other hand. Do both arms.  Then hover one hand and move cups with other hand.  Overhead/Upward Reaching:  alternated reaching up to top cabinets or ceiling if no cabinets present. Keep equal weight on each leg. Start with one hand support on counter while other hand reaches and progress to no hand support with reaching.  ace one hand in middle of sink and reach with other hand. Do both arms.   Looking Over Shoulders: With equal weight on each leg: alternate turning to look over your shoulders with one hand support on counter as needed.  Start with head motions only to look in front of shoulder, then even with shoulder and progress to looking behind you. To look to side, move head /eyes, then shoulder on side looking pulls back, shift more weight to side looking and pull hip back. Place one hand in middle of sink and let go with other hand so your shoulder can pull back. Switch hands to look other way. If looking right, use left hand at sink. If looking left, use right hand at sink. 6.  Stepping with leg tapping forefoot into lower cabinet:  Move items under cabinet out of your way. Shift your hips/pelvis so weight on right side. Tighten muscles in hip on right side.  SLOWLY step left leg so front of foot is in cabinet. Then step back to floor.  Repeat with other leg    ASSESSMENT:   CLINICAL IMPRESSION: He was limited some by shin pain and will see prosthetist today about this. We continued to work on balance and gait within his pain tolerance today and he will continue to benefit from skilled PT.    OBJECTIVE IMPAIRMENTS Abnormal gait, decreased activity tolerance, decreased balance, decreased coordination,  decreased endurance, decreased knowledge of use of DME, decreased mobility, difficulty walking, decreased ROM, decreased strength, decreased safety awareness, increased edema, impaired flexibility, postural dysfunction, prosthetic dependency , and pain.    ACTIVITY LIMITATIONS bending, sitting, standing, squatting, stairs, transfers, bathing, dressing, hygiene/grooming, locomotion level, and prosthetic use   PARTICIPATION LIMITATIONS: cleaning, laundry, and community activity   PERSONAL FACTORS Fitness, Time since onset of injury/illness/exacerbation, and 3+ comorbidities: see PMH  are also affecting patient's functional outcome.    REHAB POTENTIAL: Good   CLINICAL DECISION MAKING: Evolving/moderate complexity   EVALUATION COMPLEXITY: Moderate     GOALS: Goals reviewed with patient? Yes  UPDATED SHORT TERM GOALS: Target date: 01/11/22  Patient independent with donning/doffing prosthesis. Baseline: see objective data Goal status: MET 01/07/2022  Patient able to transfer sit to/from stand chair without armrests with RW with supervision. Baseline: see objective data Goal status: MET 01/07/2022  Patient ambulates 100' with rollator & prosthesis / AFO with supervision including ramp and curb. Baseline: see objective data Goal status: MET 01/07/2022  LONG TERM GOALS: Target date: 02/07/22   Patient wears prosthesis > 90% of waking hours with no limb pain or skin issues to promote function and safety. Baseline: see objective data Goal status: INITIAL   2.  Patient independent with all aspects of prosthetic care to enable safe utilization of prosthesis. Baseline: see objective data Goal status: INITIAL   3.  Patient ambulates 84' with LRAD safely and modified independent to allow for household mobility. Baseline: see objective data Goal status: INITIAL   4.  Patient able to ambulate 200' and navigate ramp and curb with LRAD safely with minA/ caregiver assist to allow for community  mobility. Baseline: see objective data Goal status: INITIAL   5.  Berg balance score >/= 30/56 with RW to indicate low fall risk during household level balance requirements with  UE support. Baseline: see objective data Goal status: INITIAL     PLAN: PT FREQUENCY: 2x/week   PT DURATION: other: 90 days   PLANNED INTERVENTIONS: Therapeutic exercises, Therapeutic activity, Neuromuscular re-education, Balance training, Gait training, Patient/Family education, Self Care, Stair training, Vestibular training, Orthotic/Fit training, Prosthetic training, DME instructions, Moist heat, scar mobilization, Manual therapy, Re-evaluation, and physical performance tests   PLAN FOR NEXT SESSION: what did Ronalee Belts say about his prosthesis?  work on standing balance activities & exercises. Work on functional gait progressing distance and scanning while ambulating.   Debbe Odea, PT, DPT 01/10/2022, 12:23 PM

## 2022-01-15 ENCOUNTER — Encounter: Payer: Self-pay | Admitting: Physical Therapy

## 2022-01-15 ENCOUNTER — Encounter: Payer: Medicare Other | Admitting: Physical Therapy

## 2022-01-15 ENCOUNTER — Ambulatory Visit (INDEPENDENT_AMBULATORY_CARE_PROVIDER_SITE_OTHER): Payer: Medicare Other | Admitting: Physical Therapy

## 2022-01-15 DIAGNOSIS — R2689 Other abnormalities of gait and mobility: Secondary | ICD-10-CM

## 2022-01-15 DIAGNOSIS — M79605 Pain in left leg: Secondary | ICD-10-CM

## 2022-01-15 DIAGNOSIS — M6281 Muscle weakness (generalized): Secondary | ICD-10-CM

## 2022-01-15 DIAGNOSIS — R2681 Unsteadiness on feet: Secondary | ICD-10-CM | POA: Diagnosis not present

## 2022-01-15 DIAGNOSIS — R293 Abnormal posture: Secondary | ICD-10-CM | POA: Diagnosis not present

## 2022-01-15 DIAGNOSIS — L98498 Non-pressure chronic ulcer of skin of other sites with other specified severity: Secondary | ICD-10-CM

## 2022-01-15 NOTE — Therapy (Signed)
OUTPATIENT PHYSICAL THERAPY TREATMENT NOTE    Patient Name: [name] MRN: [id number] DOB:[birth year], 71 y.o., male Today's Date: 01/15/2022  PCP: Lorenda Hatchet, FNP REFERRING PROVIDER: [name], MD  END OF SESSION:   PT End of Session - 01/15/22 0844     Visit Number 18    Number of Visits 25    Date for PT Re-Evaluation 02/07/22    Authorization Type Medicare and BCBS    Progress Note Due on Visit 84    PT Start Time 0844    PT Stop Time 0930    PT Time Calculation (min) 46 min    Equipment Utilized During Treatment Gait belt    Activity Tolerance Patient limited by fatigue;Patient limited by pain    Behavior During Therapy [facility] for tasks assessed/performed                 Past Medical History:  Diagnosis Date   Ambulates with cane    straight cane   Anemia    Cervical myelopathy ([place]) 02/06/2018   Chronic kidney disease    dailysis M W F- home   Complication of anesthesia    Coronary artery disease    Diabetes mellitus without complication ([place])    type2   Diabetic foot ulcer ([place])    Diabetic neuropathy ([place]) 02/06/2018   Gait abnormality 08/24/2018   GERD (gastroesophageal reflux disease)     01/06/20- not current   Gout    History of blood transfusion    History of blood transfusion    History of kidney stones    passed stones   Hypercholesteremia    Hypertension    Hypothyroidism    Neuromuscular disorder ([place])    neuropathy left leg and bilateral feet   Neuropathy    PONV (postoperative nausea and vomiting)    Prostate cancer ([place])    PVD (peripheral vascular disease) ([place])    with amputations   Past Surgical History:  Procedure Laterality Date   A-FLUTTER ABLATION N/A 04/06/2020   Procedure: A-FLUTTER ABLATION;  Surgeon: [name], MD;  Location: [facility];  Service: Cardiovascular;  Laterality: N/A;   AMPUTATION Left 12/25/2013   Procedure: AMPUTATION RAY LEFT 5TH RAY;  Surgeon: [name], MD;  Location:  [facility];  Service: Orthopedics;  Laterality: Left;   AMPUTATION Right 12/15/2018   Procedure: AMPUTATION OF 4TH AND 5TH TOES RIGHT FOOT;  Surgeon: [name], MD;  Location: [place];  Service: Orthopedics;  Laterality: Right;   AMPUTATION Left 02/02/2021   Procedure: LEFT FOOT 4TH RAY AMPUTATION;  Surgeon: [name], MD;  Location: [place];  Service: Orthopedics;  Laterality: Left;   AMPUTATION Left 05/09/2021   Procedure: LEFT BELOW KNEE AMPUTATION;  Surgeon: [name], MD;  Location: [place];  Service: Orthopedics;  Laterality: Left;   APPLICATION OF WOUND VAC Right 12/15/2018   Procedure: APPLICATION OF WOUND VAC;  Surgeon: [name], MD;  Location: [place];  Service: Orthopedics;  Laterality: Right;   AV FISTULA PLACEMENT Left 03/25/2019   Procedure: LEFT ARM ARTERIOVENOUS (AV) FISTULA CREATION;  Surgeon: [name], MD;  Location: [place];  Service: Vascular;  Laterality: Left;   [place] Left 05/20/2019   Procedure: SECOND STAGE LEFT BASCILIC VEIN TRANSPOSITION;  Surgeon: [name], MD;  Location: [place];  Service: Vascular;  Laterality: Left;   CARDIAC CATHETERIZATION  02/17/2014  CHOLECYSTECTOMY     COLONOSCOPY  2011   in [place], Normal   CORONARY ARTERY BYPASS GRAFT  2008   CYSTOSCOPY N/A 06/29/2020   Procedure: CYSTOSCOPY WITH CLOT EVACUATION AND  FULGERATION;  Surgeon: [name], MD;  Location: [place];  Service: Urology;  Laterality: N/A;   CYSTOSCOPY WITH FULGERATION Bilateral 06/17/2020   Procedure: CYSTOSCOPY,BILATERAL RETROGRADE, CLOT EVACUATION WITH FULGERATION OF THE BLADDER;  Surgeon: Janith Lima, MD;  Location: [place];  Service: Urology;  Laterality: Bilateral;   CYSTOSCOPY WITH FULGERATION N/A 06/28/2020   Procedure: CYSTOSCOPY WITH CLOT EVACUATION AND FULGERATION OF BLEEDERS;  Surgeon: [name], MD;  Location: [place];  Service: Urology;  Laterality: N/A;  1 HR   I & D EXTREMITY Right 12/15/2018    Procedure: DEBRIDEMENT RIGHT FOOT;  Surgeon: [name], MD;  Location: [place];  Service: Orthopedics;  Laterality: Right;   I & D EXTREMITY Right 08/27/2019   Procedure: PARTIAL CUBOID EXCISION RIGHT FOOT;  Surgeon: [name], MD;  Location: [place];  Service: Orthopedics;  Laterality: Right;   I & D EXTREMITY Right 01/07/2020   Procedure: RIGHT FOOT EXCISION INFECTED BONE;  Surgeon: [name], MD;  Location: [place];  Service: Orthopedics;  Laterality: Right;   NECK SURGERY     novemver 2019   STUMP REVISION Left 06/27/2021   Procedure: REVISION LEFT BELOW KNEE AMPUTATION;  Surgeon: [name], MD;  Location: [place];  Service: Orthopedics;  Laterality: Left;   TRANSURETHRAL RESECTION OF PROSTATE N/A 06/28/2020   Procedure: TRANSURETHRAL RESECTION OF THE PROSTATE (TURP);  Surgeon: [name], MD;  Location: [place];  Service: Urology;  Laterality: N/A;   WISDOM TOOTH EXTRACTION     Patient Active Problem List   Diagnosis Date Noted   Community acquired pneumonia of left lower lobe of lung 08/18/2021   Paroxysmal atrial fibrillation ([place]) 08/18/2021   Anemia of chronic renal failure 08/18/2021   Thrombocytopenia ([place]) 36/62/9476   Acute metabolic encephalopathy 54/65/0354   Action induced myoclonus 08/18/2021   Dehiscence of amputation stump of left lower extremity ([place])    Left below-knee amputee ([place]) 05/11/2021   S/P BKA (below knee amputation) unilateral, left ([place]) 05/09/2021   Partial nontraumatic amputation of foot, left ([place]) 02/20/2021   Abscess of bursa of left foot 02/03/2021   Cutaneous abscess of left foot    Acute osteomyelitis of metatarsal bone of left foot (H[facility])    Symptomatic anemia 09/13/2020   COVID-19 virus infection 09/13/2020   Pressure injury of skin 06/29/2020   Gross hematuria 06/16/2020   Typical atrial flutter ([place]) 03/24/2020   Bilateral pleural effusion 02/24/2020   Healthcare maintenance 01/28/2020   Shortness of breath 01/28/2020    History of partial ray amputation of fourth toe of right foot ([place]) 09/08/2019   Ulcerated, foot, right, with necrosis of bone (HCC)    Chronic cough 06/25/2019   Cutaneous abscess of right foot    Subacute osteomyelitis of right foot ([place]) 12/14/2018   AKI (acute kidney injury) ([place]) 12/14/2018   ESRD on hemodialysis ([place]) 12/14/2018   Gait abnormality 08/24/2018   Diabetic neuropathy ([place]) 02/06/2018   Cervical myelopathy ([place]) 02/06/2018   Onychomycosis 10/30/2017   Other spondylosis with radiculopathy, cervical region 01/27/2017   Midfoot ulcer, right, limited to breakdown of skin ([place]) 11/15/2016   Lateral epicondylitis, left elbow 08/12/2016   Cellulitis of fifth toe of right foot 07/29/2016   Prostate cancer ([place]) 06/19/2016   Non-pressure  chronic ulcer of other part of right foot limited to breakdown of skin ([place]) 04/04/2016   Cellulitis of leg, right 12/24/2015   Cellulitis of right lower extremity 12/24/2015   Gout 09/14/2012   Hypertension associated with diabetes ([place]) 09/14/2012   Hypothyroidism 09/14/2012   CAD (coronary artery disease) 09/14/2012   Insulin dependent type 2 diabetes mellitus ([place]) 09/14/2012   Hyperlipidemia associated with type 2 diabetes mellitus ([place])    Neuropathy ([place])     REFERRING DIAG: O70.962E (ICD-10-CM) - Below-knee amputation of left lower extremity  THERAPY DIAG:  Unsteadiness on feet  Other abnormalities of gait and mobility  Abnormal posture  Muscle weakness (generalized)  Pain in left leg  Non-pressure chronic ulcer of skin of other sites with other specified severity ([place])  Rationale for Evaluation and Treatment Rehabilitation  PERTINENT HISTORY: braced right foot with cavovarus collapse, CAP without hypoxia, acute metabolic encephalopathy, ESRD on HD Sun/W/F, afib s/p ablation, DM II, neuropathy, HTN, HLD, hypothyroidism, anemia, prostate cancer s/p TUPP. cervical myelopathy, gout, CAD w/CABG 2005  PRECAUTIONS: no BP in  LUE, fall risk   SUBJECTIVE:   He saw Staci Righter, [facility] on Thursday, 10/5 who put window in socket over tibial crest which helped a little bit.    PAIN:  Are you having pain?  Yes seated with prosthesis  1-2/10,  standing 2-3/10, walking 3-4/10 increasing with distance, highest since last PT 4-5/10 Location: pain in distal lateral shin  Aggravating factor: wearing & weight bearing on prosthesis increases with time & distance  Alleviating factor: removing prosthesis   OBJECTIVE: (objective measures completed at initial evaluation unless otherwise dated) COGNITION: Overall cognitive status: Within functional limits for tasks assessed              SENSATION: Not tested   POSTURE: rounded shoulders, forward head, and anterior pelvic tilt - RLE resting position with tibial IR, severe foot supination and inversion with cavovarus collapse  PALPATION 12/17/21 Tenderness to palpation at distal lateral tibia, potential bone spur formation at distal lateral tibia   LOWER EXTREMITY ROM:   ROM (A: AROM, P: PROM) Right eval Left eval Left 01/15/22  Hip flexion       Hip extension       Hip abduction       Hip adduction       Hip internal rotation       Hip external rotation       Knee flexion       Knee extension   Standing with RW and prosthesis  A: -16* Seated P: -12* without hamstring tension  Hamstring tightness   Seated P: -19*  Ankle dorsiflexion       Ankle plantarflexion       Ankle inversion       Ankle eversion        (Blank rows = not tested)   LOWER EXTREMITY MMT:   MMT Right eval Left eval  Hip flexion      Hip extension      Hip abduction      Hip adduction      Hip internal rotation      Hip external rotation      Knee flexion      Knee extension      Ankle dorsiflexion      Ankle plantarflexion      Ankle inversion      Ankle eversion      (Blank rows = not tested)   Functional strength  with standing and transfers 3- at hip and knee bil., with no  motion at Rt ankle   TRANSFERS: Sit to stand: minA - fell backwards into chair upon first attempt, MinA for next attempt with RW stabilization and verbal cueing for foot placement Stand to sit: minA for controlling descent and stabilization   GAIT: Gait pattern:  adduction RLE with toe-in, step to pattern, decreased step length- Right, decreased stance time- Right, decreased stride length, decreased hip/knee flexion- Left, decreased ankle dorsiflexion- Right, Left hip hike, Right foot flat, knee flexed in stance- Right, knee flexed in stance- Left, lateral hip instability, lateral lean- Right, trunk flexed, and poor foot clearance- Right Distance walked: 35' Assistive device utilized: Environmental consultant - 2 wheeled & Left TTA prosthesis, right double upright AFO custom shoe Level of assistance: MinA to recover losses of balance with straight surface walking, ModA to recover losses of balance turning Lt, MaxA with loss of balance during turning Rt Comments: Rt lower leg positioning reduced foot clearance with catching Lt heel most steps, and promoted Rt lateral leaning/lateral loss of balance. Requires excessive BUE support on RW.   FUNCTIONAL TESTs:  Berg Balance Scale: 6/56, 13/56 with RW   [facility] PT Assessment - 11/13/21 0001                Standardized Balance Assessment    Standardized Balance Assessment Berg Balance Test          Berg Balance Test    Sit to Stand Needs moderate or maximal assist to stand   Berg task with RW = 1    Standing Unsupported Unable to stand 30 seconds unassisted   Berg task with RW = 2    Sitting with Back Unsupported but Feet Supported on Floor or Stool Able to sit safely and securely 2 minutes     Stand to Sit Needs assistance to sit   Berg task with RW = 2    Transfers Able to transfer with verbal cueing and /or supervision     Standing Unsupported with Eyes Closed Needs help to keep from falling   Berg task with RW = 0    Standing Unsupported with Feet Together  Needs help to attain position and unable to hold for 15 seconds   Berg task with RW = 0    From Standing, Reach Forward with Outstretched Arm Loses balance while trying/requires external support   Berg task with RW = 1 (CGA reaching with Lt)    From Standing Position, Pick up Object from Floor Unable to try/needs assist to keep balance   Berg task with RW = 0    From Standing Position, Turn to Look Behind Over each Shoulder Needs assist to keep from losing balance and falling   Berg task with RW = 0    Turn 360 Degrees Needs assistance while turning   Berg task with RW = 0    Standing Unsupported, Alternately Place Feet on Step/Stool Needs assistance to keep from falling or unable to try   Berg task with RW = 0    Standing Unsupported, One Foot in [employer] balance while stepping or standing   Berg task with RW = 0    Standing on One Leg Unable to try or needs assist to prevent fall   Berg task with RW = 1    Total Score 6     Berg comment: Berg tasks with RW = 13/56  CURRENT PROSTHETIC WEAR ASSESSMENT: Patient is dependent with: skin check, residual limb care, care of non-amputated limb, prosthetic cleaning, ply sock cleaning, correct ply sock adjustment, proper wear schedule/adjustment, and proper weight-bearing schedule/adjustment Donning prosthesis: Min A and maximal cueing Doffing prosthesis: SBA Prosthetic wear tolerance: 30 minutes 1-2x/day 6 of 6 days since delivery Prosthetic weight bearing tolerance: stood ~5 minutes with c/o tibial crest pain moderate level after weight bearing Edema: present with slow capillary refill Residual limb condition: 1.6 cm scab along incision, cylinderical shape, normal color & temperature, mildly dry skin, slight scar adhesion at distal tibia,  Prosthetic description: silicone liner with pin lock suspension, 2nd pelite foam liner, total contact socket, SACH foot     TODAY'S TREATMENT:  01/15/2022 Prosthetic Training with left  Transtibial Prosthesis & right double upright AFO: TTA socket has window with leather cover in socket but not in secondary pelite liner.  Superficial wound on scar at distal tibia with scab & no signs of infection.   Pt ambulated >150'  in /out of PT with rollator walker with verbal cues for RLE foot position / not adducting and upright posture looking forward / not staring at floor including coorelation to  LE ext vs flexion.    Therapeutic Exercise: PT instructed in components of well-rounded fitness / exercise program to include flexibility, strength, balance & endurance.  If his left knee and hamstrings are tight this can be related to anterior limb pain with TTA.     01/15/22 0930  PT Education  Education Details Access Code: [email or web address]  Person(s) Educated Patient;Spouse  Methods Explanation;Demonstration;Tactile cues;Verbal cues;Handout  Comprehension Returned demonstration;Tactile cues required;Verbalized understanding;Verbal cues required;Need further instruction    Access Code: [email or web address] URL: [email or web address] Date: 01/15/2022 Prepared by: Jamey Reas  Exercises - Seated Hamstring Stretch with Strap  - 2-3 x daily - 7 x weekly - 1 sets - 3 reps - 30 seconds hold - Seated Passive Knee Extension with Weight  - 1-3 x daily - 7 x weekly - 1 sets - 1-3 reps - up to 5 minutes hold - Seated Quad Set  - 1 x daily - 7 x weekly - 1 sets - 10 reps - 5 seconds hold - Standing Gastroc Stretch on Step  - 2-3 x daily - 7 x weekly - 1 sets - 1-3 reps - 30 seconds hold  01/10/2022 Prosthetic Training with left Transtibial Prosthesis & right double upright AFO: Pt ambulated >150'  in /out of PT with rollator walker with verbal cues for RLE foot position / not adducting and upright posture looking forward.  Pt donnes & doffes prosthesis independently Pt neg curb with rollator walker with supervision. Did not perform ramp as this causes too much pain and he relays he can do  ramps without difficulty  Therapeutic Exercise:  Standing BUE support //bars: red theraband around ankles -  mirror to facilitate upright posture  Alternating abduction including eccentric control 10 reps  Alternating stepping forward with visual target to maintain proper step width 10 reps Standing one UE support in //bars unilateral upper body rows and chest press with green X 15 each bilat Leg press DL 75# 2X15, then SL 37# on Rt 2X10, 25# on Lt X 8 (stopped due to shin pain and reduced to 12# for 10 more reps on left)  01/07/2022 Prosthetic Training with left Transtibial Prosthesis & right double upright AFO: Pt ambulated >150'  in /out of PT with rollator walker with verbal cues for RLE foot position /  not adducting and upright posture looking forward.  Pt donnes & doffes prosthesis independently but verbal cues for initiating liner to minimize pain.  Pt reports that he is in agreement with not ambulating with cane.  Pt neg curb with rollator walker with supervision. He reports ramp (one in PT clinic is 11-12* incline) hurts his limb too much. Pt & wife report able to neg ramps in community.   Therapeutic Exercise:  Standing BUE support //bars: red theraband around ankles -  mirror to facilitate upright posture  Alternating abduction including eccentric control 10 reps  Alternating stepping forward with visual target to maintain proper step width 10 reps Standing with back to door frame with rollator support working on upright posture reaching single UE overhead / eyes looking forward 2 deep breathes 2 reps ea UE. PT recommended >/= 3x/day.  Standing with posterior pelvis to counter top with locked rollator walker hands on counter with cues for upright posture.  PT recommended 1x/day trying to increase time.      HOME EXERCISE PROGRAM:   11/22/21 1244  PT Education  Education Details HEP at sink  Person(s) Educated Patient;Spouse  Methods Explanation;Demonstration;Tactile  cues;Verbal cues;Handout  Comprehension Verbalized understanding;Returned demonstration;Verbal cues required;Tactile cues required;Need further instruction    Do each exercise 1-2  times per day Do each exercise 5-10 repetitions Hold each exercise for 2 seconds to feel your location  AT [place].  Try to find this position when standing still for activities.   USE TAPE ON FLOOR TO MARK THE MIDLINE POSITION which is even with middle of sink.  You also should try to feel with your limb pressure in socket.  You are trying to feel with limb what you used to feel with the bottom of your foot.  Side to Side Shift: Moving your hips only (not shoulders): move weight onto your left leg, HOLD/FEEL pressure in socket.  Move back to equal weight on each leg, HOLD/FEEL pressure in socket. Move weight onto your right leg, HOLD/FEEL pressure in socket. Move back to equal weight on each leg, HOLD/FEEL pressure in socket. Repeat.  Start with both hands on sink, then right hand only, then left hand only Front to Back Shift: Moving your hips only (not shoulders): move your weight forward onto your toes, HOLD/FEEL pressure in socket. Move your weight back to equal Flat Foot on both legs, HOLD/FEEL  pressure in socket. Move your weight back onto your heels, HOLD/FEEL  pressure in socket. Move your weight back to equal on both legs, HOLD/FEEL  pressure in socket. Repeat.  Start with both hands on sink, then right hand only, then left hand only  Moving Cones / Cups: With equal weight on each leg: Hold on with one hand the first time, then progress to no hand supports. Move cups from one side of sink to the other. Place cups ~2" out of your reach, progress to 10" beyond reach.  Place one hand in middle of sink and reach with other hand. Do both arms.  Then hover one hand and move cups with other hand.  Overhead/Upward Reaching: alternated reaching up to  top cabinets or ceiling if no cabinets present. Keep equal weight on each leg. Start with one hand support on counter while other hand reaches and progress to no hand support with reaching.  ace one hand in middle of sink and reach with other hand. Do both arms.  Looking Over Shoulders: With equal weight on each leg: alternate turning to look over your shoulders with one hand support on counter as needed.  Start with head motions only to look in front of shoulder, then even with shoulder and progress to looking behind you. To look to side, move head /eyes, then shoulder on side looking pulls back, shift more weight to side looking and pull hip back. Place one hand in middle of sink and let go with other hand so your shoulder can pull back. Switch hands to look other way. If looking right, use left hand at sink. If looking left, use right hand at sink. 6.  Stepping with leg tapping forefoot into lower cabinet:  Move items under cabinet out of your way. Shift your hips/pelvis so weight on right side. Tighten muscles in hip on right side.  SLOWLY step left leg so front of foot is in cabinet. Then step back to floor.  Repeat with other leg    ASSESSMENT:   CLINICAL IMPRESSION: PT instructed in stretch for left knee & hamstring as knee flexion standing & gait would increase anterior limb pain.  His wife & he appear to understand the HEP.  He will continue to benefit from skilled PT.    OBJECTIVE IMPAIRMENTS Abnormal gait, decreased activity tolerance, decreased balance, decreased coordination, decreased endurance, decreased knowledge of use of DME, decreased mobility, difficulty walking, decreased ROM, decreased strength, decreased safety awareness, increased edema, impaired flexibility, postural dysfunction, prosthetic dependency , and pain.    ACTIVITY LIMITATIONS bending, sitting, standing, squatting, stairs, transfers, bathing, dressing, hygiene/grooming, locomotion level, and prosthetic use    PARTICIPATION LIMITATIONS: cleaning, laundry, and community activity   PERSONAL FACTORS Fitness, Time since onset of injury/illness/exacerbation, and 3+ comorbidities: see PMH  are also affecting patient's functional outcome.    REHAB POTENTIAL: Good   CLINICAL DECISION MAKING: Evolving/moderate complexity   EVALUATION COMPLEXITY: Moderate     GOALS: Goals reviewed with patient? Yes  UPDATED SHORT TERM GOALS: Target date: 01/11/22  Patient independent with donning/doffing prosthesis. Baseline: see objective data Goal status: MET 01/07/2022  Patient able to transfer sit to/from stand chair without armrests with RW with supervision. Baseline: see objective data Goal status: MET 01/07/2022  Patient ambulates 100' with rollator & prosthesis / AFO with supervision including ramp and curb. Baseline: see objective data Goal status: MET 01/07/2022  LONG TERM GOALS: Target date: 02/07/22   Patient wears prosthesis > 90% of waking hours with no limb pain or skin issues to promote function and safety. Baseline: see objective data Goal status: Ongoing 01/15/2022   2.  Patient independent with all aspects of prosthetic care to enable safe utilization of prosthesis. Baseline: see objective data Goal status: Ongoing 01/15/2022   3.  Patient ambulates 1' with LRAD safely and modified independent to allow for household mobility. Baseline: see objective data Goal status: Ongoing 01/15/2022   4.  Patient able to ambulate 200' and navigate ramp and curb with LRAD safely with minA/ caregiver assist to allow for community mobility. Baseline: see objective data Goal status: Ongoing 01/15/2022   5.  Berg balance score >/= 30/56 with RW to indicate low fall risk during household level balance requirements with UE support. Baseline: see objective data Goal status: Ongoing 01/15/2022     PLAN: PT FREQUENCY: 2x/week   PT DURATION: other: 90 days   PLANNED INTERVENTIONS: Therapeutic  exercises, Therapeutic activity, Neuromuscular re-education, Balance training, Gait training, Patient/Family education, Self Care, Stair training, Vestibular training, Orthotic/Fit  training, Prosthetic training, DME instructions, Moist heat, scar mobilization, Manual therapy, Re-evaluation, and physical performance tests   PLAN FOR NEXT SESSION:  check on HEP instructed 10/10,  work on standing balance activities & exercises. Work on functional gait progressing distance and scanning while ambulating.   Jamey Reas, PT, DPT 01/15/2022, 10:35 AM

## 2022-01-17 ENCOUNTER — Ambulatory Visit (INDEPENDENT_AMBULATORY_CARE_PROVIDER_SITE_OTHER): Payer: Medicare Other | Admitting: Physical Therapy

## 2022-01-17 ENCOUNTER — Encounter: Payer: Self-pay | Admitting: Physical Therapy

## 2022-01-17 DIAGNOSIS — M6281 Muscle weakness (generalized): Secondary | ICD-10-CM | POA: Diagnosis not present

## 2022-01-17 DIAGNOSIS — R293 Abnormal posture: Secondary | ICD-10-CM | POA: Diagnosis not present

## 2022-01-17 DIAGNOSIS — R2689 Other abnormalities of gait and mobility: Secondary | ICD-10-CM

## 2022-01-17 DIAGNOSIS — R2681 Unsteadiness on feet: Secondary | ICD-10-CM | POA: Diagnosis not present

## 2022-01-17 DIAGNOSIS — M79605 Pain in left leg: Secondary | ICD-10-CM

## 2022-01-17 NOTE — Therapy (Addendum)
OUTPATIENT PHYSICAL THERAPY TREATMENT NOTE    Patient Name: [name] MRN: [id number] DOB:[birth year], 71 y.o., male Today's Date: 01/17/2022  PCP: Lorenda Hatchet, FNP REFERRING PROVIDER: [name], MD  END OF SESSION:   PT End of Session - 01/17/22 1151     Visit Number 19    Number of Visits 25    Date for PT Re-Evaluation 02/07/22    Authorization Type Medicare and BCBS    Progress Note Due on Visit 23    PT Start Time 1140    PT Stop Time 1220    PT Time Calculation (min) 40 min    Equipment Utilized During Treatment Gait belt    Activity Tolerance Patient limited by fatigue;Patient limited by pain    Behavior During Therapy [facility] for tasks assessed/performed                 Past Medical History:  Diagnosis Date   Ambulates with cane    straight cane   Anemia    Cervical myelopathy ([place]) 02/06/2018   Chronic kidney disease    dailysis M W F- home   Complication of anesthesia    Coronary artery disease    Diabetes mellitus without complication ([place])    type2   Diabetic foot ulcer ([place])    Diabetic neuropathy ([place]) 02/06/2018   Gait abnormality 08/24/2018   GERD (gastroesophageal reflux disease)     01/06/20- not current   Gout    History of blood transfusion    History of blood transfusion    History of kidney stones    passed stones   Hypercholesteremia    Hypertension    Hypothyroidism    Neuromuscular disorder ([place])    neuropathy left leg and bilateral feet   Neuropathy    PONV (postoperative nausea and vomiting)    Prostate cancer ([place])    PVD (peripheral vascular disease) ([place])    with amputations   Past Surgical History:  Procedure Laterality Date   A-FLUTTER ABLATION N/A 04/06/2020   Procedure: A-FLUTTER ABLATION;  Surgeon: [name], MD;  Location: [place] [facility];  Service: Cardiovascular;  Laterality: N/A;   AMPUTATION Left 12/25/2013   Procedure: AMPUTATION RAY LEFT 5TH RAY;  Surgeon: [name], MD;  Location:  [facility];  Service: Orthopedics;  Laterality: Left;   AMPUTATION Right 12/15/2018   Procedure: AMPUTATION OF 4TH AND 5TH TOES RIGHT FOOT;  Surgeon: [name], MD;  Location: [place];  Service: Orthopedics;  Laterality: Right;   AMPUTATION Left 02/02/2021   Procedure: LEFT FOOT 4TH RAY AMPUTATION;  Surgeon: [name], MD;  Location: [place];  Service: Orthopedics;  Laterality: Left;   AMPUTATION Left 05/09/2021   Procedure: LEFT BELOW KNEE AMPUTATION;  Surgeon: [name], MD;  Location: [place];  Service: Orthopedics;  Laterality: Left;   APPLICATION OF WOUND VAC Right 12/15/2018   Procedure: APPLICATION OF WOUND VAC;  Surgeon: [name], MD;  Location: [place];  Service: Orthopedics;  Laterality: Right;   AV FISTULA PLACEMENT Left 03/25/2019   Procedure: LEFT ARM ARTERIOVENOUS (AV) FISTULA CREATION;  Surgeon: [name], MD;  Location: [place];  Service: Vascular;  Laterality: Left;   [place] Left 05/20/2019   Procedure: SECOND STAGE LEFT BASCILIC VEIN TRANSPOSITION;  Surgeon: [name], MD;  Location: [place];  Service: Vascular;  Laterality: Left;   CARDIAC CATHETERIZATION  02/17/2014  CHOLECYSTECTOMY     COLONOSCOPY  2011   in [place], Normal   CORONARY ARTERY BYPASS GRAFT  2008   CYSTOSCOPY N/A 06/29/2020   Procedure: CYSTOSCOPY WITH CLOT EVACUATION AND  FULGERATION;  Surgeon: [name], MD;  Location: [place];  Service: Urology;  Laterality: N/A;   CYSTOSCOPY WITH FULGERATION Bilateral 06/17/2020   Procedure: CYSTOSCOPY,BILATERAL RETROGRADE, CLOT EVACUATION WITH FULGERATION OF THE BLADDER;  Surgeon: Janith Lima, MD;  Location: [place];  Service: Urology;  Laterality: Bilateral;   CYSTOSCOPY WITH FULGERATION N/A 06/28/2020   Procedure: CYSTOSCOPY WITH CLOT EVACUATION AND FULGERATION OF BLEEDERS;  Surgeon: [name], MD;  Location: [place];  Service: Urology;  Laterality: N/A;  1 HR   I & D EXTREMITY Right 12/15/2018    Procedure: DEBRIDEMENT RIGHT FOOT;  Surgeon: [name], MD;  Location: [place];  Service: Orthopedics;  Laterality: Right;   I & D EXTREMITY Right 08/27/2019   Procedure: PARTIAL CUBOID EXCISION RIGHT FOOT;  Surgeon: [name], MD;  Location: [place];  Service: Orthopedics;  Laterality: Right;   I & D EXTREMITY Right 01/07/2020   Procedure: RIGHT FOOT EXCISION INFECTED BONE;  Surgeon: [name], MD;  Location: [place];  Service: Orthopedics;  Laterality: Right;   NECK SURGERY     novemver 2019   STUMP REVISION Left 06/27/2021   Procedure: REVISION LEFT BELOW KNEE AMPUTATION;  Surgeon: [name], MD;  Location: [place];  Service: Orthopedics;  Laterality: Left;   TRANSURETHRAL RESECTION OF PROSTATE N/A 06/28/2020   Procedure: TRANSURETHRAL RESECTION OF THE PROSTATE (TURP);  Surgeon: [name], MD;  Location: [place];  Service: Urology;  Laterality: N/A;   WISDOM TOOTH EXTRACTION     Patient Active Problem List   Diagnosis Date Noted   Community acquired pneumonia of left lower lobe of lung 08/18/2021   Paroxysmal atrial fibrillation ([place]) 08/18/2021   Anemia of chronic renal failure 08/18/2021   Thrombocytopenia ([place]) 56/43/3295   Acute metabolic encephalopathy 18/84/1660   Action induced myoclonus 08/18/2021   Dehiscence of amputation stump of left lower extremity ([place])    Left below-knee amputee ([place]) 05/11/2021   S/P BKA (below knee amputation) unilateral, left ([place]) 05/09/2021   Partial nontraumatic amputation of foot, left ([place]) 02/20/2021   Abscess of bursa of left foot 02/03/2021   Cutaneous abscess of left foot    Acute osteomyelitis of metatarsal bone of left foot (HCC)    Symptomatic anemia 09/13/2020   COVID-19 virus infection 09/13/2020   Pressure injury of skin 06/29/2020   Gross hematuria 06/16/2020   Typical atrial flutter ([place]) 03/24/2020   Bilateral pleural effusion 02/24/2020   Healthcare maintenance 01/28/2020   Shortness of breath 01/28/2020    History of partial ray amputation of fourth toe of right foot ([place]) 09/08/2019   Ulcerated, foot, right, with necrosis of bone (HCC)    Chronic cough 06/25/2019   Cutaneous abscess of right foot    Subacute osteomyelitis of right foot ([place]) 12/14/2018   AKI (acute kidney injury) ([place]) 12/14/2018   ESRD on hemodialysis ([place]) 12/14/2018   Gait abnormality 08/24/2018   Diabetic neuropathy ([place]) 02/06/2018   Cervical myelopathy ([place]) 02/06/2018   Onychomycosis 10/30/2017   Other spondylosis with radiculopathy, cervical region 01/27/2017   Midfoot ulcer, right, limited to breakdown of skin ([place]) 11/15/2016   Lateral epicondylitis, left elbow 08/12/2016   Cellulitis of fifth toe of right foot 07/29/2016   Prostate cancer ([place]) 06/19/2016   Non-pressure  chronic ulcer of other part of right foot limited to breakdown of skin ([place]) 04/04/2016   Cellulitis of leg, right 12/24/2015   Cellulitis of right lower extremity 12/24/2015   Gout 09/14/2012   Hypertension associated with diabetes ([place]) 09/14/2012   Hypothyroidism 09/14/2012   CAD (coronary artery disease) 09/14/2012   Insulin dependent type 2 diabetes mellitus ([place]) 09/14/2012   Hyperlipidemia associated with type 2 diabetes mellitus ([place])    Neuropathy ([place])     REFERRING DIAG: I34.742V (ICD-10-CM) - Below-knee amputation of left lower extremity  THERAPY DIAG:  Unsteadiness on feet  Other abnormalities of gait and mobility  Abnormal posture  Muscle weakness (generalized)  Pain in left leg  Rationale for Evaluation and Treatment Rehabilitation  PERTINENT HISTORY: braced right foot with cavovarus collapse, CAP without hypoxia, acute metabolic encephalopathy, ESRD on HD Sun/W/F, afib s/p ablation, DM II, neuropathy, HTN, HLD, hypothyroidism, anemia, prostate cancer s/p TUPP. cervical myelopathy, gout, CAD w/CABG 2005  PRECAUTIONS: no BP in LUE, fall risk   SUBJECTIVE:   He saw Staci Righter, [facility] on Thursday, 10/5 who put window in  socket over tibial crest which helped a little bit.    PAIN:  Are you having pain?  Yes seated with prosthesis  1-2/10,  standing 2-3/10, walking 3-4/10 increasing with distance, highest since last PT 4-5/10 Location: pain in distal lateral shin  Aggravating factor: wearing & weight bearing on prosthesis increases with time & distance  Alleviating factor: removing prosthesis   OBJECTIVE: (objective measures completed at initial evaluation unless otherwise dated) COGNITION: Overall cognitive status: Within functional limits for tasks assessed              SENSATION: Not tested   POSTURE: rounded shoulders, forward head, and anterior pelvic tilt - RLE resting position with tibial IR, severe foot supination and inversion with cavovarus collapse  PALPATION 12/17/21 Tenderness to palpation at distal lateral tibia, potential bone spur formation at distal lateral tibia   LOWER EXTREMITY ROM:   ROM (A: AROM, P: PROM) Right eval Left eval Left 01/15/22  Hip flexion       Hip extension       Hip abduction       Hip adduction       Hip internal rotation       Hip external rotation       Knee flexion       Knee extension   Standing with RW and prosthesis  A: -16* Seated P: -12* without hamstring tension  Hamstring tightness   Seated P: -19*  Ankle dorsiflexion       Ankle plantarflexion       Ankle inversion       Ankle eversion        (Blank rows = not tested)   LOWER EXTREMITY MMT:   MMT Right eval Left eval  Hip flexion      Hip extension      Hip abduction      Hip adduction      Hip internal rotation      Hip external rotation      Knee flexion      Knee extension      Ankle dorsiflexion      Ankle plantarflexion      Ankle inversion      Ankle eversion      (Blank rows = not tested)   Functional strength with standing and transfers 3- at hip and knee bil., with no motion at  Rt ankle   TRANSFERS: Sit to stand: minA - fell backwards into chair upon first  attempt, MinA for next attempt with RW stabilization and verbal cueing for foot placement Stand to sit: minA for controlling descent and stabilization   GAIT: Gait pattern:  adduction RLE with toe-in, step to pattern, decreased step length- Right, decreased stance time- Right, decreased stride length, decreased hip/knee flexion- Left, decreased ankle dorsiflexion- Right, Left hip hike, Right foot flat, knee flexed in stance- Right, knee flexed in stance- Left, lateral hip instability, lateral lean- Right, trunk flexed, and poor foot clearance- Right Distance walked: 35' Assistive device utilized: Environmental consultant - 2 wheeled & Left TTA prosthesis, right double upright AFO custom shoe Level of assistance: MinA to recover losses of balance with straight surface walking, ModA to recover losses of balance turning Lt, MaxA with loss of balance during turning Rt Comments: Rt lower leg positioning reduced foot clearance with catching Lt heel most steps, and promoted Rt lateral leaning/lateral loss of balance. Requires excessive BUE support on RW.   FUNCTIONAL TESTs:  Berg Balance Scale: 6/56, 13/56 with RW   [facility] PT Assessment - 11/13/21 0001                Standardized Balance Assessment    Standardized Balance Assessment Berg Balance Test          Berg Balance Test    Sit to Stand Needs moderate or maximal assist to stand   Berg task with RW = 1    Standing Unsupported Unable to stand 30 seconds unassisted   Berg task with RW = 2    Sitting with Back Unsupported but Feet Supported on Floor or Stool Able to sit safely and securely 2 minutes     Stand to Sit Needs assistance to sit   Berg task with RW = 2    Transfers Able to transfer with verbal cueing and /or supervision     Standing Unsupported with Eyes Closed Needs help to keep from falling   Berg task with RW = 0    Standing Unsupported with Feet Together Needs help to attain position and unable to hold for 15 seconds   Berg task with RW = 0    From  Standing, Reach Forward with Outstretched Arm Loses balance while trying/requires external support   Berg task with RW = 1 (CGA reaching with Lt)    From Standing Position, Pick up Object from Floor Unable to try/needs assist to keep balance   Berg task with RW = 0    From Standing Position, Turn to Look Behind Over each Shoulder Needs assist to keep from losing balance and falling   Berg task with RW = 0    Turn 360 Degrees Needs assistance while turning   Berg task with RW = 0    Standing Unsupported, Alternately Place Feet on Step/Stool Needs assistance to keep from falling or unable to try   Berg task with RW = 0    Standing Unsupported, One Foot in [employer] balance while stepping or standing   Berg task with RW = 0    Standing on One Leg Unable to try or needs assist to prevent fall   Berg task with RW = 1    Total Score 6     Berg comment: Berg tasks with RW = 13/56                     CURRENT PROSTHETIC  WEAR ASSESSMENT: Patient is dependent with: skin check, residual limb care, care of non-amputated limb, prosthetic cleaning, ply sock cleaning, correct ply sock adjustment, proper wear schedule/adjustment, and proper weight-bearing schedule/adjustment Donning prosthesis: Min A and maximal cueing Doffing prosthesis: SBA Prosthetic wear tolerance: 30 minutes 1-2x/day 6 of 6 days since delivery Prosthetic weight bearing tolerance: stood ~5 minutes with c/o tibial crest pain moderate level after weight bearing Edema: present with slow capillary refill Residual limb condition: 1.6 cm scab along incision, cylinderical shape, normal color & temperature, mildly dry skin, slight scar adhesion at distal tibia,  Prosthetic description: silicone liner with pin lock suspension, 2nd pelite foam liner, total contact socket, SACH foot     TODAY'S TREATMENT:  01/17/2022 Prosthetic Training with left Transtibial Prosthesis & right double upright AFO: Pt ambulated 175'  max tolerated distance  rollator walker with verbal cues for RLE foot position / not adducting and upright posture looking forward / not staring at floor -Leg press DL 75# 2X15, then SL 37# on Rt 2X10, 25# on Lt X 15  -Hamstring stretching on leg press 30 sec X 2 bilat -Standing hip 4 way X 15 each plane with band around left left and X 10 each plane with band around Rt leg (more limited by pain with this one so did less reps to his tolerance) -He states he has stationary bike at home and wanted to know if he could perform this, I had him try recumbent bike in our clinic but he is unable to keep his feet in the pedals so would not recommend this for him at home. We then did Nu step instead L5 X 10 min -Reviewed the rest of his HEP   01/15/2022 Prosthetic Training with left Transtibial Prosthesis & right double upright AFO: TTA socket has window with leather cover in socket but not in secondary pelite liner.  Superficial wound on scar at distal tibia with scab & no signs of infection.   Pt ambulated >150'  in /out of PT with rollator walker with verbal cues for RLE foot position / not adducting and upright posture looking forward / not staring at floor including coorelation to  LE ext vs flexion.    Therapeutic Exercise: PT instructed in components of well-rounded fitness / exercise program to include flexibility, strength, balance & endurance.  If his left knee and hamstrings are tight this can be related to anterior limb pain with TTA.     01/15/22 0930  PT Education  Education Details Access Code: [email or web address]  Person(s) Educated Patient;Spouse  Methods Explanation;Demonstration;Tactile cues;Verbal cues;Handout  Comprehension Returned demonstration;Tactile cues required;Verbalized understanding;Verbal cues required;Need further instruction    Access Code: [email or web address] URL: [email or web address] Date: 01/15/2022 Prepared by: Jamey Reas  Exercises - Seated Hamstring Stretch with Strap  - 2-3 x  daily - 7 x weekly - 1 sets - 3 reps - 30 seconds hold - Seated Passive Knee Extension with Weight  - 1-3 x daily - 7 x weekly - 1 sets - 1-3 reps - up to 5 minutes hold - Seated Quad Set  - 1 x daily - 7 x weekly - 1 sets - 10 reps - 5 seconds hold - Standing Gastroc Stretch on Step  - 2-3 x daily - 7 x weekly - 1 sets - 1-3 reps - 30 seconds hold  01/10/2022 Prosthetic Training with left Transtibial Prosthesis & right double upright AFO: Pt ambulated >150'  in /out of PT with rollator walker with verbal cues for RLE  foot position / not adducting and upright posture looking forward.  Pt donnes & doffes prosthesis independently Pt neg curb with rollator walker with supervision. Did not perform ramp as this causes too much pain and he relays he can do ramps without difficulty  Therapeutic Exercise:  Standing BUE support //bars: red theraband around ankles -  mirror to facilitate upright posture  Alternating abduction including eccentric control 10 reps  Alternating stepping forward with visual target to maintain proper step width 10 reps Standing one UE support in //bars unilateral upper body rows and chest press with green X 15 each bilat Leg press DL 75# 2X15, then SL 37# on Rt 2X10, 25# on Lt X 8 (stopped due to shin pain and reduced to 12# for 10 more reps on left)    HOME EXERCISE PROGRAM:   11/22/21 1244  PT Education  Education Details HEP at sink  Person(s) Educated Patient;Spouse  Methods Explanation;Demonstration;Tactile cues;Verbal cues;Handout  Comprehension Verbalized understanding;Returned demonstration;Verbal cues required;Tactile cues required;Need further instruction    Do each exercise 1-2  times per day Do each exercise 5-10 repetitions Hold each exercise for 2 seconds to feel your location  AT [place].  Try to find this position when standing still for activities.   USE TAPE ON FLOOR TO MARK  THE MIDLINE POSITION which is even with middle of sink.  You also should try to feel with your limb pressure in socket.  You are trying to feel with limb what you used to feel with the bottom of your foot.  Side to Side Shift: Moving your hips only (not shoulders): move weight onto your left leg, HOLD/FEEL pressure in socket.  Move back to equal weight on each leg, HOLD/FEEL pressure in socket. Move weight onto your right leg, HOLD/FEEL pressure in socket. Move back to equal weight on each leg, HOLD/FEEL pressure in socket. Repeat.  Start with both hands on sink, then right hand only, then left hand only Front to Back Shift: Moving your hips only (not shoulders): move your weight forward onto your toes, HOLD/FEEL pressure in socket. Move your weight back to equal Flat Foot on both legs, HOLD/FEEL  pressure in socket. Move your weight back onto your heels, HOLD/FEEL  pressure in socket. Move your weight back to equal on both legs, HOLD/FEEL  pressure in socket. Repeat.  Start with both hands on sink, then right hand only, then left hand only  Moving Cones / Cups: With equal weight on each leg: Hold on with one hand the first time, then progress to no hand supports. Move cups from one side of sink to the other. Place cups ~2" out of your reach, progress to 10" beyond reach.  Place one hand in middle of sink and reach with other hand. Do both arms.  Then hover one hand and move cups with other hand.  Overhead/Upward Reaching: alternated reaching up to top cabinets or ceiling if no cabinets present. Keep equal weight on each leg. Start with one hand support on counter while other hand reaches and progress to no hand support with reaching.  ace one hand in middle of sink and reach with other hand. Do both arms.   Looking Over Shoulders: With equal weight on each leg: alternate turning to look over your shoulders with one hand support on counter as needed.  Start with head motions only to look in front of shoulder,  then even with  shoulder and progress to looking behind you. To look to side, move head /eyes, then shoulder on side looking pulls back, shift more weight to side looking and pull hip back. Place one hand in middle of sink and let go with other hand so your shoulder can pull back. Switch hands to look other way. If looking right, use left hand at sink. If looking left, use right hand at sink. 6.  Stepping with leg tapping forefoot into lower cabinet:  Move items under cabinet out of your way. Shift your hips/pelvis so weight on right side. Tighten muscles in hip on right side.  SLOWLY step left leg so front of foot is in cabinet. Then step back to floor.  Repeat with other leg    ASSESSMENT:   CLINICAL IMPRESSION: We reviewed HEP and he showed good understanding. We continued to work to improve hamstring length, standing activity tolerance, gait, and leg strength. His pain has improved since he had window adjusted into his socket however he still has left residual limb pain that limits his standing activity and he will continue to benefit from skilled PT   OBJECTIVE IMPAIRMENTS Abnormal gait, decreased activity tolerance, decreased balance, decreased coordination, decreased endurance, decreased knowledge of use of DME, decreased mobility, difficulty walking, decreased ROM, decreased strength, decreased safety awareness, increased edema, impaired flexibility, postural dysfunction, prosthetic dependency , and pain.    ACTIVITY LIMITATIONS bending, sitting, standing, squatting, stairs, transfers, bathing, dressing, hygiene/grooming, locomotion level, and prosthetic use   PARTICIPATION LIMITATIONS: cleaning, laundry, and community activity   PERSONAL FACTORS Fitness, Time since onset of injury/illness/exacerbation, and 3+ comorbidities: see PMH  are also affecting patient's functional outcome.    REHAB POTENTIAL: Good   CLINICAL DECISION MAKING: Evolving/moderate complexity   EVALUATION COMPLEXITY:  Moderate     GOALS: Goals reviewed with patient? Yes  UPDATED SHORT TERM GOALS: Target date: 01/11/22  Patient independent with donning/doffing prosthesis. Baseline: see objective data Goal status: MET 01/07/2022  Patient able to transfer sit to/from stand chair without armrests with RW with supervision. Baseline: see objective data Goal status: MET 01/07/2022  Patient ambulates 100' with rollator & prosthesis / AFO with supervision including ramp and curb. Baseline: see objective data Goal status: MET 01/07/2022  LONG TERM GOALS: Target date: 02/07/22   Patient wears prosthesis > 90% of waking hours with no limb pain or skin issues to promote function and safety. Baseline: see objective data Goal status: Ongoing 01/15/2022   2.  Patient independent with all aspects of prosthetic care to enable safe utilization of prosthesis. Baseline: see objective data Goal status: Ongoing 01/15/2022   3.  Patient ambulates 34' with LRAD safely and modified independent to allow for household mobility. Baseline: see objective data Goal status: Ongoing 01/15/2022   4.  Patient able to ambulate 200' and navigate ramp and curb with LRAD safely with minA/ caregiver assist to allow for community mobility. Baseline: see objective data Goal status: Ongoing 01/15/2022   5.  Berg balance score >/= 30/56 with RW to indicate low fall risk during household level balance requirements with UE support. Baseline: see objective data Goal status: Ongoing 01/15/2022     PLAN: PT FREQUENCY: 2x/week   PT DURATION: other: 90 days   PLANNED INTERVENTIONS: Therapeutic exercises, Therapeutic activity, Neuromuscular re-education, Balance training, Gait training, Patient/Family education, Self Care, Stair training, Vestibular training, Orthotic/Fit training, Prosthetic training, DME instructions, Moist heat, scar mobilization, Manual therapy, Re-evaluation, and physical performance tests   PLAN FOR NEXT SESSION:  check on HEP instructed 10/10,  work on standing balance activities & exercises. Work on functional gait progressing distance and scanning while ambulating.   Debbe Odea, PT, DPT 01/17/2022, 11:52 AM

## 2022-01-22 ENCOUNTER — Encounter: Payer: Self-pay | Admitting: Physical Therapy

## 2022-01-22 ENCOUNTER — Encounter: Payer: Medicare Other | Admitting: Physical Therapy

## 2022-01-22 ENCOUNTER — Ambulatory Visit (INDEPENDENT_AMBULATORY_CARE_PROVIDER_SITE_OTHER): Payer: Medicare Other | Admitting: Physical Therapy

## 2022-01-22 DIAGNOSIS — R2689 Other abnormalities of gait and mobility: Secondary | ICD-10-CM | POA: Diagnosis not present

## 2022-01-22 DIAGNOSIS — L98498 Non-pressure chronic ulcer of skin of other sites with other specified severity: Secondary | ICD-10-CM

## 2022-01-22 DIAGNOSIS — R293 Abnormal posture: Secondary | ICD-10-CM

## 2022-01-22 DIAGNOSIS — M6281 Muscle weakness (generalized): Secondary | ICD-10-CM

## 2022-01-22 DIAGNOSIS — M79605 Pain in left leg: Secondary | ICD-10-CM

## 2022-01-22 DIAGNOSIS — R2681 Unsteadiness on feet: Secondary | ICD-10-CM | POA: Diagnosis not present

## 2022-01-22 NOTE — Therapy (Signed)
OUTPATIENT PHYSICAL THERAPY TREATMENT NOTE    Patient Name: [name] MRN: [id number] DOB:[birth year], 71 y.o., male Today's Date: 01/22/2022  PCP: Lorenda Hatchet, FNP REFERRING PROVIDER: [name], MD  END OF SESSION:   PT End of Session - 01/22/22 0846     Visit Number 20    Number of Visits 25    Date for PT Re-Evaluation 02/07/22    Authorization Type Medicare and BCBS    Progress Note Due on Visit 99    PT Start Time 0843    PT Stop Time 0932    PT Time Calculation (min) 49 min    Equipment Utilized During Treatment Gait belt    Activity Tolerance Patient limited by fatigue;Patient limited by pain    Behavior During Therapy [facility] for tasks assessed/performed                  Past Medical History:  Diagnosis Date   Ambulates with cane    straight cane   Anemia    Cervical myelopathy ([place]) 02/06/2018   Chronic kidney disease    dailysis M W F- home   Complication of anesthesia    Coronary artery disease    Diabetes mellitus without complication ([place])    type2   Diabetic foot ulcer ([place])    Diabetic neuropathy ([name]) 02/06/2018   Gait abnormality 08/24/2018   GERD (gastroesophageal reflux disease)     01/06/20- not current   Gout    History of blood transfusion    History of blood transfusion    History of kidney stones    passed stones   Hypercholesteremia    Hypertension    Hypothyroidism    Neuromuscular disorder ([place])    neuropathy left leg and bilateral feet   Neuropathy    PONV (postoperative nausea and vomiting)    Prostate cancer ([place])    PVD (peripheral vascular disease) ([place])    with amputations   Past Surgical History:  Procedure Laterality Date   A-FLUTTER ABLATION N/A 04/06/2020   Procedure: A-FLUTTER ABLATION;  Surgeon: [name], MD;  Location: [place] [facility];  Service: Cardiovascular;  Laterality: N/A;   AMPUTATION Left 12/25/2013   Procedure: AMPUTATION RAY LEFT 5TH RAY;  Surgeon: [name], MD;   Location: [facility];  Service: Orthopedics;  Laterality: Left;   AMPUTATION Right 12/15/2018   Procedure: AMPUTATION OF 4TH AND 5TH TOES RIGHT FOOT;  Surgeon: [name], MD;  Location: [place];  Service: Orthopedics;  Laterality: Right;   AMPUTATION Left 02/02/2021   Procedure: LEFT FOOT 4TH RAY AMPUTATION;  Surgeon: [name], MD;  Location: [place];  Service: Orthopedics;  Laterality: Left;   AMPUTATION Left 05/09/2021   Procedure: LEFT BELOW KNEE AMPUTATION;  Surgeon: [name], MD;  Location: [place];  Service: Orthopedics;  Laterality: Left;   APPLICATION OF WOUND VAC Right 12/15/2018   Procedure: APPLICATION OF WOUND VAC;  Surgeon: [name], MD;  Location: [place];  Service: Orthopedics;  Laterality: Right;   AV FISTULA PLACEMENT Left 03/25/2019   Procedure: LEFT ARM ARTERIOVENOUS (AV) FISTULA CREATION;  Surgeon: [name], MD;  Location: [place];  Service: Vascular;  Laterality: Left;   [place] Left 05/20/2019   Procedure: SECOND STAGE LEFT BASCILIC VEIN TRANSPOSITION;  Surgeon: [name], MD;  Location: [place];  Service: Vascular;  Laterality: Left;   CARDIAC CATHETERIZATION  02/17/2014  CHOLECYSTECTOMY     COLONOSCOPY  2011   in [place], Normal   CORONARY ARTERY BYPASS GRAFT  2008   CYSTOSCOPY N/A 06/29/2020   Procedure: CYSTOSCOPY WITH CLOT EVACUATION AND  FULGERATION;  Surgeon: [name], MD;  Location: [name];  Service: Urology;  Laterality: N/A;   CYSTOSCOPY WITH FULGERATION Bilateral 06/17/2020   Procedure: CYSTOSCOPY,BILATERAL RETROGRADE, CLOT EVACUATION WITH FULGERATION OF THE BLADDER;  Surgeon: Janith Lima, MD;  Location: [place];  Service: Urology;  Laterality: Bilateral;   CYSTOSCOPY WITH FULGERATION N/A 06/28/2020   Procedure: CYSTOSCOPY WITH CLOT EVACUATION AND FULGERATION OF BLEEDERS;  Surgeon: [name], MD;  Location: [place];  Service: Urology;  Laterality: N/A;  1 HR   I & D EXTREMITY Right  12/15/2018   Procedure: DEBRIDEMENT RIGHT FOOT;  Surgeon: [name], MD;  Location: [place];  Service: Orthopedics;  Laterality: Right;   I & D EXTREMITY Right 08/27/2019   Procedure: PARTIAL CUBOID EXCISION RIGHT FOOT;  Surgeon: [name], MD;  Location: [place];  Service: Orthopedics;  Laterality: Right;   I & D EXTREMITY Right 01/07/2020   Procedure: RIGHT FOOT EXCISION INFECTED BONE;  Surgeon: [name], MD;  Location: [place];  Service: Orthopedics;  Laterality: Right;   NECK SURGERY     novemver 2019   STUMP REVISION Left 06/27/2021   Procedure: REVISION LEFT BELOW KNEE AMPUTATION;  Surgeon: [name], MD;  Location: [place];  Service: Orthopedics;  Laterality: Left;   TRANSURETHRAL RESECTION OF PROSTATE N/A 06/28/2020   Procedure: TRANSURETHRAL RESECTION OF THE PROSTATE (TURP);  Surgeon: [name], MD;  Location: [place];  Service: Urology;  Laterality: N/A;   WISDOM TOOTH EXTRACTION     Patient Active Problem List   Diagnosis Date Noted   Community acquired pneumonia of left lower lobe of lung 08/18/2021   Paroxysmal atrial fibrillation ([place]) 08/18/2021   Anemia of chronic renal failure 08/18/2021   Thrombocytopenia ([place]) 78/93/8101   Acute metabolic encephalopathy 75/01/2584   Action induced myoclonus 08/18/2021   Dehiscence of amputation stump of left lower extremity ([place])    Left below-knee amputee ([place]) 05/11/2021   S/P BKA (below knee amputation) unilateral, left ([place]) 05/09/2021   Partial nontraumatic amputation of foot, left ([place]) 02/20/2021   Abscess of bursa of left foot 02/03/2021   Cutaneous abscess of left foot    Acute osteomyelitis of metatarsal bone of left foot (HCC)    Symptomatic anemia 09/13/2020   COVID-19 virus infection 09/13/2020   Pressure injury of skin 06/29/2020   Gross hematuria 06/16/2020   Typical atrial flutter ([place]) 03/24/2020   Bilateral pleural effusion 02/24/2020   Healthcare maintenance 01/28/2020   Shortness of breath  01/28/2020   History of partial ray amputation of fourth toe of right foot ([place]) 09/08/2019   Ulcerated, foot, right, with necrosis of bone (HCC)    Chronic cough 06/25/2019   Cutaneous abscess of right foot    Subacute osteomyelitis of right foot ([place]) 12/14/2018   AKI (acute kidney injury) ([place]) 12/14/2018   ESRD on hemodialysis ([place]) 12/14/2018   Gait abnormality 08/24/2018   Diabetic neuropathy ([name]) 02/06/2018   Cervical myelopathy ([place]) 02/06/2018   Onychomycosis 10/30/2017   Other spondylosis with radiculopathy, cervical region 01/27/2017   Midfoot ulcer, right, limited to breakdown of skin ([place]) 11/15/2016   Lateral epicondylitis, left elbow 08/12/2016   Cellulitis of fifth toe of right foot 07/29/2016   Prostate cancer ([place]) 06/19/2016   Non-pressure  chronic ulcer of other part of right foot limited to breakdown of skin ([place]) 04/04/2016   Cellulitis of leg, right 12/24/2015   Cellulitis of right lower extremity 12/24/2015   Gout 09/14/2012   Hypertension associated with diabetes ([place]) 09/14/2012   Hypothyroidism 09/14/2012   CAD (coronary artery disease) 09/14/2012   Insulin dependent type 2 diabetes mellitus ([place]) 09/14/2012   Hyperlipidemia associated with type 2 diabetes mellitus ([place])    Neuropathy ([place])     REFERRING DIAG: I14.431V (ICD-10-CM) - Below-knee amputation of left lower extremity  THERAPY DIAG:  Unsteadiness on feet  Other abnormalities of gait and mobility  Abnormal posture  Muscle weakness (generalized)  Pain in left leg  Non-pressure chronic ulcer of skin of other sites with other specified severity ([place])  Rationale for Evaluation and Treatment Rehabilitation  PERTINENT HISTORY: braced right foot with cavovarus collapse, CAP without hypoxia, acute metabolic encephalopathy, ESRD on HD Sun/W/F, afib s/p ablation, DM II, neuropathy, HTN, HLD, hypothyroidism, anemia, prostate cancer s/p TUPP. cervical myelopathy, gout, CAD w/CABG  2005  PRECAUTIONS: no BP in LUE, fall risk   SUBJECTIVE:   The thicker liner seems to be working better but he still has pain.  He has appt with prosthetist on Thursday.  The limb hurts with walking but does not seem to get worse with distance    PAIN:  Are you having pain?  Yes seated with prosthesis  1-2/10,  standing 2-3/10, walking 3-4/10 increasing with distance, highest since last PT 4-5/10 Location: pain in distal lateral shin  Aggravating factor: wearing & weight bearing on prosthesis increases with time & distance  Alleviating factor: removing prosthesis   OBJECTIVE: (objective measures completed at initial evaluation unless otherwise dated) COGNITION: Overall cognitive status: Within functional limits for tasks assessed              SENSATION: Not tested   POSTURE: rounded shoulders, forward head, and anterior pelvic tilt - RLE resting position with tibial IR, severe foot supination and inversion with cavovarus collapse  PALPATION 12/17/21 Tenderness to palpation at distal lateral tibia, potential bone spur formation at distal lateral tibia   LOWER EXTREMITY ROM:   ROM (A: AROM, P: PROM) Right eval Left eval Left 01/15/22  Hip flexion       Hip extension       Hip abduction       Hip adduction       Hip internal rotation       Hip external rotation       Knee flexion       Knee extension   Standing with RW and prosthesis  A: -16* Seated P: -12* without hamstring tension  Hamstring tightness   Seated P: -19*  Ankle dorsiflexion       Ankle plantarflexion       Ankle inversion       Ankle eversion        (Blank rows = not tested)   LOWER EXTREMITY MMT:   MMT Right eval Left eval  Hip flexion      Hip extension      Hip abduction      Hip adduction      Hip internal rotation      Hip external rotation      Knee flexion      Knee extension      Ankle dorsiflexion      Ankle plantarflexion      Ankle inversion      Ankle  eversion      (Blank  rows = not tested)   Functional strength with standing and transfers 3- at hip and knee bil., with no motion at Rt ankle   TRANSFERS: Sit to stand: minA - fell backwards into chair upon first attempt, MinA for next attempt with RW stabilization and verbal cueing for foot placement Stand to sit: minA for controlling descent and stabilization   GAIT: Gait pattern:  adduction RLE with toe-in, step to pattern, decreased step length- Right, decreased stance time- Right, decreased stride length, decreased hip/knee flexion- Left, decreased ankle dorsiflexion- Right, Left hip hike, Right foot flat, knee flexed in stance- Right, knee flexed in stance- Left, lateral hip instability, lateral lean- Right, trunk flexed, and poor foot clearance- Right Distance walked: 35' Assistive device utilized: Environmental consultant - 2 wheeled & Left TTA prosthesis, right double upright AFO custom shoe Level of assistance: MinA to recover losses of balance with straight surface walking, ModA to recover losses of balance turning Lt, MaxA with loss of balance during turning Rt Comments: Rt lower leg positioning reduced foot clearance with catching Lt heel most steps, and promoted Rt lateral leaning/lateral loss of balance. Requires excessive BUE support on RW.   FUNCTIONAL TESTs:  Berg Balance Scale: 6/56, 13/56 with RW   [facility] PT Assessment - 11/13/21 0001                Standardized Balance Assessment    Standardized Balance Assessment Berg Balance Test          Berg Balance Test    Sit to Stand Needs moderate or maximal assist to stand   Berg task with RW = 1    Standing Unsupported Unable to stand 30 seconds unassisted   Berg task with RW = 2    Sitting with Back Unsupported but Feet Supported on Floor or Stool Able to sit safely and securely 2 minutes     Stand to Sit Needs assistance to sit   Berg task with RW = 2    Transfers Able to transfer with verbal cueing and /or supervision     Standing Unsupported with Eyes Closed  Needs help to keep from falling   Berg task with RW = 0    Standing Unsupported with Feet Together Needs help to attain position and unable to hold for 15 seconds   Berg task with RW = 0    From Standing, Reach Forward with Outstretched Arm Loses balance while trying/requires external support   Berg task with RW = 1 (CGA reaching with Lt)    From Standing Position, Pick up Object from Floor Unable to try/needs assist to keep balance   Berg task with RW = 0    From Standing Position, Turn to Look Behind Over each Shoulder Needs assist to keep from losing balance and falling   Berg task with RW = 0    Turn 360 Degrees Needs assistance while turning   Berg task with RW = 0    Standing Unsupported, Alternately Place Feet on Step/Stool Needs assistance to keep from falling or unable to try   Berg task with RW = 0    Standing Unsupported, One Foot in [employer] balance while stepping or standing   Berg task with RW = 0    Standing on One Leg Unable to try or needs assist to prevent fall   Berg task with RW = 1    Total Score 6     Merrilee Jansky  comment: Berg tasks with RW = 13/56                     CURRENT PROSTHETIC WEAR ASSESSMENT: Patient is dependent with: skin check, residual limb care, care of non-amputated limb, prosthetic cleaning, ply sock cleaning, correct ply sock adjustment, proper wear schedule/adjustment, and proper weight-bearing schedule/adjustment Donning prosthesis: Min A and maximal cueing Doffing prosthesis: SBA Prosthetic wear tolerance: 30 minutes 1-2x/day 6 of 6 days since delivery Prosthetic weight bearing tolerance: stood ~5 minutes with c/o tibial crest pain moderate level after weight bearing Edema: present with slow capillary refill Residual limb condition: 1.6 cm scab along incision, cylinderical shape, normal color & temperature, mildly dry skin, slight scar adhesion at distal tibia,  Prosthetic description: silicone liner with pin lock suspension, 2nd pelite foam liner,  total contact socket, SACH foot     TODAY'S TREATMENT:  01/22/2022 Prosthetic Training with left Transtibial Prosthesis & right double upright AFO: Pt continues to report limb pain limiting standing & gait.  He has slight discoloration where popliteal pad is located.  PT recommended letting prosthetist know at appt on Thursday.  PT reviewed donning again with intentionally placing pin with slight posterior tilt to pull some of his soft tissue towards tibia and seat pelite liner on limb prior to donning socket.    Therapeutic Exercise PT reviewed HEP with pt performing with correctional cues. Seated hamstring stretch, sitting with LE propped & quad set and standing gastroc stretch with 2" block.  Pt & wife reported better understanding.  Pt performed standing with BUE support with red theraband figure 8 around BLEs focus on eccentric return to bilateral stance without adducting feet: alternating LEs stepping abduction, stepping forward & extension 5 reps ea.  His limb was sore so continued seated on 24" bar stool - stepping forward and stepping abduction 10 reps ea.       01/15/2022 Prosthetic Training with left Transtibial Prosthesis & right double upright AFO: Pt ambulated 175'  max tolerated distance rollator walker with verbal cues for RLE foot position / not adducting and upright posture looking forward / not staring at floor -Leg press DL 75# 2X15, then SL 37# on Rt 2X10, 25# on Lt X 15  -Hamstring stretching on leg press 30 sec X 2 bilat -Standing hip 4 way X 15 each plane with band around left left and X 10 each plane with band around Rt leg (more limited by pain with this one so did less reps to his tolerance) -He states he has stationary bike at home and wanted to know if he could perform this, I had him try recumbent bike in our clinic but he is unable to keep his feet in the pedals so would not recommend this for him at home. We then did Nu step instead L5 X 10 min -Reviewed the rest  of his HEP   01/15/2022 Prosthetic Training with left Transtibial Prosthesis & right double upright AFO: TTA socket has window with leather cover in socket but not in secondary pelite liner.  Superficial wound on scar at distal tibia with scab & no signs of infection.   Pt ambulated >150'  in /out of PT with rollator walker with verbal cues for RLE foot position / not adducting and upright posture looking forward / not staring at floor including coorelation to  LE ext vs flexion.    Therapeutic Exercise: PT instructed in components of well-rounded fitness / exercise program to include flexibility, strength,  balance & endurance.  If his left knee and hamstrings are tight this can be related to anterior limb pain with TTA.     01/15/22 0930  PT Education  Education Details Access Code: [email or web address]  Person(s) Educated Patient;Spouse  Methods Explanation;Demonstration;Tactile cues;Verbal cues;Handout  Comprehension Returned demonstration;Tactile cues required;Verbalized understanding;Verbal cues required;Need further instruction    Access Code: [email or web address] URL: [email or web address] Date: 01/15/2022 Prepared by: Jamey Reas  Exercises - Seated Hamstring Stretch with Strap  - 2-3 x daily - 7 x weekly - 1 sets - 3 reps - 30 seconds hold - Seated Passive Knee Extension with Weight  - 1-3 x daily - 7 x weekly - 1 sets - 1-3 reps - up to 5 minutes hold - Seated Quad Set  - 1 x daily - 7 x weekly - 1 sets - 10 reps - 5 seconds hold - Standing Gastroc Stretch on Step  - 2-3 x daily - 7 x weekly - 1 sets - 1-3 reps - 30 seconds hold     HOME EXERCISE PROGRAM:   11/22/21 1244  PT Education  Education Details HEP at sink  Person(s) Educated Patient;Spouse  Methods Explanation;Demonstration;Tactile cues;Verbal cues;Handout  Comprehension Verbalized understanding;Returned demonstration;Verbal cues required;Tactile cues required;Need further instruction    Do each exercise 1-2   times per day Do each exercise 5-10 repetitions Hold each exercise for 2 seconds to feel your location  AT [place].  Try to find this position when standing still for activities.   USE TAPE ON FLOOR TO MARK THE MIDLINE POSITION which is even with middle of sink.  You also should try to feel with your limb pressure in socket.  You are trying to feel with limb what you used to feel with the bottom of your foot.  Side to Side Shift: Moving your hips only (not shoulders): move weight onto your left leg, HOLD/FEEL pressure in socket.  Move back to equal weight on each leg, HOLD/FEEL pressure in socket. Move weight onto your right leg, HOLD/FEEL pressure in socket. Move back to equal weight on each leg, HOLD/FEEL pressure in socket. Repeat.  Start with both hands on sink, then right hand only, then left hand only Front to Back Shift: Moving your hips only (not shoulders): move your weight forward onto your toes, HOLD/FEEL pressure in socket. Move your weight back to equal Flat Foot on both legs, HOLD/FEEL  pressure in socket. Move your weight back onto your heels, HOLD/FEEL  pressure in socket. Move your weight back to equal on both legs, HOLD/FEEL  pressure in socket. Repeat.  Start with both hands on sink, then right hand only, then left hand only  Moving Cones / Cups: With equal weight on each leg: Hold on with one hand the first time, then progress to no hand supports. Move cups from one side of sink to the other. Place cups ~2" out of your reach, progress to 10" beyond reach.  Place one hand in middle of sink and reach with other hand. Do both arms.  Then hover one hand and move cups with other hand.  Overhead/Upward Reaching: alternated reaching up to top cabinets or ceiling if no cabinets present. Keep equal weight on each leg. Start with one hand support on counter while other hand reaches and progress to no hand support with  reaching.  ace one hand in middle of sink and reach with other hand. Do both arms.  Looking Over Shoulders: With equal weight on each leg: alternate turning to look over your shoulders with one hand support on counter as needed.  Start with head motions only to look in front of shoulder, then even with shoulder and progress to looking behind you. To look to side, move head /eyes, then shoulder on side looking pulls back, shift more weight to side looking and pull hip back. Place one hand in middle of sink and let go with other hand so your shoulder can pull back. Switch hands to look other way. If looking right, use left hand at sink. If looking left, use right hand at sink. 6.  Stepping with leg tapping forefoot into lower cabinet:  Move items under cabinet out of your way. Shift your hips/pelvis so weight on right side. Tighten muscles in hip on right side.  SLOWLY step left leg so front of foot is in cabinet. Then step back to floor.  Repeat with other leg    ASSESSMENT:   CLINICAL IMPRESSION: Left residual limb pain that limits his standing & gait activities.  PT continues to instruct in exercises to limit knee flexion with stance as should decrease pain at distal tibia.  He continues to benefit from skilled PT.    OBJECTIVE IMPAIRMENTS Abnormal gait, decreased activity tolerance, decreased balance, decreased coordination, decreased endurance, decreased knowledge of use of DME, decreased mobility, difficulty walking, decreased ROM, decreased strength, decreased safety awareness, increased edema, impaired flexibility, postural dysfunction, prosthetic dependency , and pain.    ACTIVITY LIMITATIONS bending, sitting, standing, squatting, stairs, transfers, bathing, dressing, hygiene/grooming, locomotion level, and prosthetic use   PARTICIPATION LIMITATIONS: cleaning, laundry, and community activity   PERSONAL FACTORS Fitness, Time since onset of injury/illness/exacerbation, and 3+ comorbidities: see  PMH  are also affecting patient's functional outcome.    REHAB POTENTIAL: Good   CLINICAL DECISION MAKING: Evolving/moderate complexity   EVALUATION COMPLEXITY: Moderate     GOALS: Goals reviewed with patient? Yes  UPDATED SHORT TERM GOALS: Target date: 01/11/22  Patient independent with donning/doffing prosthesis. Baseline: see objective data Goal status: MET 01/07/2022  Patient able to transfer sit to/from stand chair without armrests with RW with supervision. Baseline: see objective data Goal status: MET 01/07/2022  Patient ambulates 100' with rollator & prosthesis / AFO with supervision including ramp and curb. Baseline: see objective data Goal status: MET 01/07/2022  LONG TERM GOALS: Target date: 02/07/22   Patient wears prosthesis > 90% of waking hours with no limb pain or skin issues to promote function and safety. Baseline: see objective data Goal status: Ongoing 01/15/2022   2.  Patient independent with all aspects of prosthetic care to enable safe utilization of prosthesis. Baseline: see objective data Goal status: Ongoing 01/15/2022   3.  Patient ambulates 46' with LRAD safely and modified independent to allow for household mobility. Baseline: see objective data Goal status: Ongoing 01/15/2022   4.  Patient able to ambulate 200' and navigate ramp and curb with LRAD safely with minA/ caregiver assist to allow for community mobility. Baseline: see objective data Goal status: Ongoing 01/15/2022   5.  Berg balance score >/= 30/56 with RW to indicate low fall risk during household level balance requirements with UE support. Baseline: see objective data Goal status: Ongoing 01/15/2022     PLAN: PT FREQUENCY: 2x/week   PT DURATION: other: 90 days   PLANNED INTERVENTIONS: Therapeutic exercises, Therapeutic activity, Neuromuscular re-education, Balance training, Gait training, Patient/Family education, Self Care, Stair training, Vestibular training, Orthotic/Fit  training, Prosthetic training, DME instructions, Moist heat, scar mobilization, Manual therapy, Re-evaluation, and physical performance tests   PLAN FOR NEXT SESSION:  work on standing balance activities & exercises. Work on functional gait progressing distance and scanning while ambulating   Jamey Reas, PT, DPT 01/22/2022, 10:02 AM

## 2022-01-24 ENCOUNTER — Ambulatory Visit (INDEPENDENT_AMBULATORY_CARE_PROVIDER_SITE_OTHER): Payer: Medicare Other | Admitting: Physical Therapy

## 2022-01-24 ENCOUNTER — Encounter: Payer: Self-pay | Admitting: Physical Therapy

## 2022-01-24 DIAGNOSIS — M25662 Stiffness of left knee, not elsewhere classified: Secondary | ICD-10-CM

## 2022-01-24 DIAGNOSIS — R2681 Unsteadiness on feet: Secondary | ICD-10-CM | POA: Diagnosis not present

## 2022-01-24 DIAGNOSIS — L98498 Non-pressure chronic ulcer of skin of other sites with other specified severity: Secondary | ICD-10-CM

## 2022-01-24 DIAGNOSIS — R293 Abnormal posture: Secondary | ICD-10-CM | POA: Diagnosis not present

## 2022-01-24 DIAGNOSIS — R2689 Other abnormalities of gait and mobility: Secondary | ICD-10-CM

## 2022-01-24 DIAGNOSIS — M6281 Muscle weakness (generalized): Secondary | ICD-10-CM

## 2022-01-24 DIAGNOSIS — M79605 Pain in left leg: Secondary | ICD-10-CM

## 2022-01-24 NOTE — Therapy (Signed)
OUTPATIENT PHYSICAL THERAPY TREATMENT NOTE    Patient Name: [name] MRN: [id number] DOB:10/11/1950, 71 y.o., male Today's Date: 01/24/2022  PCP: [name], Natalie Reid, FNP REFERRING PROVIDER: Duda, Marcus V, MD  END OF SESSION:   PT End of Session - 01/24/22 1143     Visit Number 21    Number of Visits 25    Date for PT Re-Evaluation 02/07/22    Authorization Type Medicare and BCBS    Progress Note Due on Visit 20    PT Start Time 1144    PT Stop Time 1225    PT Time Calculation (min) 41 min    Equipment Utilized During Treatment Gait belt    Activity Tolerance Patient limited by fatigue;Patient limited by pain    Behavior During Therapy WFL for tasks assessed/performed                   Past Medical History:  Diagnosis Date   Ambulates with cane    straight cane   Anemia    Cervical myelopathy (HCC) 02/06/2018   Chronic kidney disease    dailysis M W F- home   Complication of anesthesia    Coronary artery disease    Diabetes mellitus without complication (HCC)    type2   Diabetic foot ulcer (HCC)    Diabetic neuropathy (HCC) 02/06/2018   Gait abnormality 08/24/2018   GERD (gastroesophageal reflux disease)     01/06/20- not current   Gout    History of blood transfusion    History of blood transfusion    History of kidney stones    passed stones   Hypercholesteremia    Hypertension    Hypothyroidism    Neuromuscular disorder (HCC)    neuropathy left leg and bilateral feet   Neuropathy    PONV (postoperative nausea and vomiting)    Prostate cancer (HCC)    PVD (peripheral vascular disease) (HCC)    with amputations   Past Surgical History:  Procedure Laterality Date   A-FLUTTER ABLATION N/A 04/06/2020   Procedure: A-FLUTTER ABLATION;  Surgeon: Taylor, Gregg W, MD;  [facility]: [facility];  Service: Cardiovascular;  Laterality: N/A;   AMPUTATION Left 12/25/2013   Procedure: AMPUTATION RAY LEFT 5TH RAY;  Surgeon: Marcus Duda V, MD;   Location: [facility];  Service: Orthopedics;  Laterality: Left;   AMPUTATION Right 12/15/2018   Procedure: AMPUTATION OF 4TH AND 5TH TOES RIGHT FOOT;  Surgeon: Duda, Marcus V, MD;  Location: [facility];  Service: Orthopedics;  Laterality: Right;   AMPUTATION Left 02/02/2021   Procedure: LEFT FOOT 4TH RAY AMPUTATION;  Surgeon: Duda, Marcus V, MD;  Location: [facility];  Service: Orthopedics;  Laterality: Left;   AMPUTATION Left 05/09/2021   Procedure: LEFT BELOW KNEE AMPUTATION;  Surgeon: Duda, Marcus V, MD;  [facility];  Service: Orthopedics;  Laterality: Left;   APPLICATION OF WOUND VAC Right 12/15/2018   Procedure: APPLICATION OF WOUND VAC;  Surgeon: Duda, Marcus V, MD;  [facility];  Service: Orthopedics;  Laterality: Right;   AV FISTULA PLACEMENT Left 03/25/2019   Procedure: LEFT ARM ARTERIOVENOUS (AV) FISTULA CREATION;  Surgeon: Brabham, Vance W, MD;  [facility];  Service: Vascular;  Laterality: Left;   BACK SURGERY     BASCILIC VEIN TRANSPOSITION Left 05/20/2019   Procedure: SECOND STAGE LEFT BASCILIC VEIN TRANSPOSITION;  Surgeon: Brabham, Vance W, MD;  Location: [facility];  Service: Vascular;  Laterality: Left;   CARDIAC CATHETERIZATION  02/17/2014     CHOLECYSTECTOMY     COLONOSCOPY  2011   in [place], Normal   CORONARY ARTERY BYPASS GRAFT  2008   CYSTOSCOPY N/A 06/29/2020   Procedure: CYSTOSCOPY WITH CLOT EVACUATION AND  FULGERATION;  Surgeon: [name], MD;  Location: [name];  Service: Urology;  Laterality: N/A;   CYSTOSCOPY WITH FULGERATION Bilateral 06/17/2020   Procedure: CYSTOSCOPY,BILATERAL RETROGRADE, CLOT EVACUATION WITH FULGERATION OF THE BLADDER;  Surgeon: Janith Lima, MD;  Location: [place];  Service: Urology;  Laterality: Bilateral;   CYSTOSCOPY WITH FULGERATION N/A 06/28/2020   Procedure: CYSTOSCOPY WITH CLOT EVACUATION AND FULGERATION OF BLEEDERS;  Surgeon: [name], MD;  Location: [place];  Service: Urology;  Laterality: N/A;  1 HR   I & D EXTREMITY Right  12/15/2018   Procedure: DEBRIDEMENT RIGHT FOOT;  Surgeon: [name], MD;  Location: [place];  Service: Orthopedics;  Laterality: Right;   I & D EXTREMITY Right 08/27/2019   Procedure: PARTIAL CUBOID EXCISION RIGHT FOOT;  Surgeon: [name], MD;  Location: [place];  Service: Orthopedics;  Laterality: Right;   I & D EXTREMITY Right 01/07/2020   Procedure: RIGHT FOOT EXCISION INFECTED BONE;  Surgeon: [name], MD;  Location: [place];  Service: Orthopedics;  Laterality: Right;   NECK SURGERY     novemver 2019   STUMP REVISION Left 06/27/2021   Procedure: REVISION LEFT BELOW KNEE AMPUTATION;  Surgeon: [name], MD;  Location: [place];  Service: Orthopedics;  Laterality: Left;   TRANSURETHRAL RESECTION OF PROSTATE N/A 06/28/2020   Procedure: TRANSURETHRAL RESECTION OF THE PROSTATE (TURP);  Surgeon: [name], MD;  Location: [place];  Service: Urology;  Laterality: N/A;   WISDOM TOOTH EXTRACTION     Patient Active Problem List   Diagnosis Date Noted   Community acquired pneumonia of left lower lobe of lung 08/18/2021   Paroxysmal atrial fibrillation ([place]) 08/18/2021   Anemia of chronic renal failure 08/18/2021   Thrombocytopenia ([place]) 78/93/8101   Acute metabolic encephalopathy 75/01/2584   Action induced myoclonus 08/18/2021   Dehiscence of amputation stump of left lower extremity ([place])    Left below-knee amputee ([place]) 05/11/2021   S/P BKA (below knee amputation) unilateral, left ([place]) 05/09/2021   Partial nontraumatic amputation of foot, left ([place]) 02/20/2021   Abscess of bursa of left foot 02/03/2021   Cutaneous abscess of left foot    Acute osteomyelitis of metatarsal bone of left foot (HCC)    Symptomatic anemia 09/13/2020   COVID-19 virus infection 09/13/2020   Pressure injury of skin 06/29/2020   Gross hematuria 06/16/2020   Typical atrial flutter ([place]) 03/24/2020   Bilateral pleural effusion 02/24/2020   Healthcare maintenance 01/28/2020   Shortness of breath  01/28/2020   History of partial ray amputation of fourth toe of right foot ([place]) 09/08/2019   Ulcerated, foot, right, with necrosis of bone (HCC)    Chronic cough 06/25/2019   Cutaneous abscess of right foot    Subacute osteomyelitis of right foot ([place]) 12/14/2018   AKI (acute kidney injury) ([place]) 12/14/2018   ESRD on hemodialysis ([place]) 12/14/2018   Gait abnormality 08/24/2018   Diabetic neuropathy ([name]) 02/06/2018   Cervical myelopathy ([place]) 02/06/2018   Onychomycosis 10/30/2017   Other spondylosis with radiculopathy, cervical region 01/27/2017   Midfoot ulcer, right, limited to breakdown of skin ([place]) 11/15/2016   Lateral epicondylitis, left elbow 08/12/2016   Cellulitis of fifth toe of right foot 07/29/2016   Prostate cancer ([place]) 06/19/2016   Non-pressure  chronic ulcer of other part of right foot limited to breakdown of skin (HCC) 04/04/2016   Cellulitis of leg, right 12/24/2015   Cellulitis of right lower extremity 12/24/2015   Gout 09/14/2012   Hypertension associated with diabetes (HCC) 09/14/2012   Hypothyroidism 09/14/2012   CAD (coronary artery disease) 09/14/2012   Insulin dependent type 2 diabetes mellitus (HCC) 09/14/2012   Hyperlipidemia associated with type 2 diabetes mellitus (HCC)    Neuropathy (HCC)     REFERRING DIAG: S88.112A (ICD-10-CM) - Below-knee amputation of left lower extremity  THERAPY DIAG:  Unsteadiness on feet  Other abnormalities of gait and mobility  Abnormal posture  Muscle weakness (generalized)  Pain in left leg  Non-pressure chronic ulcer of skin of other sites with other specified severity (HCC)  Stiffness of left knee, not elsewhere classified  Rationale for Evaluation and Treatment Rehabilitation  PERTINENT HISTORY: braced right foot with cavovarus collapse, CAP without hypoxia, acute metabolic encephalopathy, ESRD on HD Sun/W/F, afib s/p ablation, DM II, neuropathy, HTN, HLD, hypothyroidism, anemia, prostate cancer s/p TUPP.  cervical myelopathy, gout, CAD w/CABG 2005  PRECAUTIONS: no BP in LUE, fall risk   SUBJECTIVE:   He saw Mike Neal, CPO this morning who gave him 2nd thicker liner & discussed options for next socket.    PAIN:  Are you having pain?  Yes seated with prosthesis 0.5-1/10,  standing 1-2/10, walking 2-3/10 increasing with distance, highest since last PT 4-5/10 Location: pain in distal lateral shin  Aggravating factor: wearing & weight bearing on prosthesis increases with time & distance  Alleviating factor: removing prosthesis   OBJECTIVE: (objective measures completed at initial evaluation unless otherwise dated) COGNITION: Overall cognitive status: Within functional limits for tasks assessed              SENSATION: Not tested   POSTURE: rounded shoulders, forward head, and anterior pelvic tilt - RLE resting position with tibial IR, severe foot supination and inversion with cavovarus collapse  PALPATION 12/17/21 Tenderness to palpation at distal lateral tibia, potential bone spur formation at distal lateral tibia   LOWER EXTREMITY ROM:   ROM (A: AROM, P: PROM) Right eval Left eval Left 01/15/22  Hip flexion       Hip extension       Hip abduction       Hip adduction       Hip internal rotation       Hip external rotation       Knee flexion       Knee extension   Standing with RW and prosthesis  A: -16* Seated P: -12* without hamstring tension  Hamstring tightness   Seated P: -19*  Ankle dorsiflexion       Ankle plantarflexion       Ankle inversion       Ankle eversion        (Blank rows = not tested)   LOWER EXTREMITY MMT:   MMT Right eval Left eval  Hip flexion      Hip extension      Hip abduction      Hip adduction      Hip internal rotation      Hip external rotation      Knee flexion      Knee extension      Ankle dorsiflexion      Ankle plantarflexion      Ankle inversion      Ankle eversion      (Blank rows = not   tested)   Functional strength  with standing and transfers 3- at hip and knee bil., with no motion at Rt ankle   TRANSFERS: Sit to stand: minA - fell backwards into chair upon first attempt, MinA for next attempt with RW stabilization and verbal cueing for foot placement Stand to sit: minA for controlling descent and stabilization   GAIT: Gait pattern:  adduction RLE with toe-in, step to pattern, decreased step length- Right, decreased stance time- Right, decreased stride length, decreased hip/knee flexion- Left, decreased ankle dorsiflexion- Right, Left hip hike, Right foot flat, knee flexed in stance- Right, knee flexed in stance- Left, lateral hip instability, lateral lean- Right, trunk flexed, and poor foot clearance- Right Distance walked: 35' Assistive device utilized: Environmental consultant - 2 wheeled & Left TTA prosthesis, right double upright AFO custom shoe Level of assistance: MinA to recover losses of balance with straight surface walking, ModA to recover losses of balance turning Lt, MaxA with loss of balance during turning Rt Comments: Rt lower leg positioning reduced foot clearance with catching Lt heel most steps, and promoted Rt lateral leaning/lateral loss of balance. Requires excessive BUE support on RW.   FUNCTIONAL TESTs:  Berg Balance Scale: 6/56, 13/56 with RW   [facility] PT Assessment - 11/13/21 0001                Standardized Balance Assessment    Standardized Balance Assessment Berg Balance Test          Berg Balance Test    Sit to Stand Needs moderate or maximal assist to stand   Berg task with RW = 1    Standing Unsupported Unable to stand 30 seconds unassisted   Berg task with RW = 2    Sitting with Back Unsupported but Feet Supported on Floor or Stool Able to sit safely and securely 2 minutes     Stand to Sit Needs assistance to sit   Berg task with RW = 2    Transfers Able to transfer with verbal cueing and /or supervision     Standing Unsupported with Eyes Closed Needs help to keep from falling   Berg task  with RW = 0    Standing Unsupported with Feet Together Needs help to attain position and unable to hold for 15 seconds   Berg task with RW = 0    From Standing, Reach Forward with Outstretched Arm Loses balance while trying/requires external support   Berg task with RW = 1 (CGA reaching with Lt)    From Standing Position, Pick up Object from Floor Unable to try/needs assist to keep balance   Berg task with RW = 0    From Standing Position, Turn to Look Behind Over each Shoulder Needs assist to keep from losing balance and falling   Berg task with RW = 0    Turn 360 Degrees Needs assistance while turning   Berg task with RW = 0    Standing Unsupported, Alternately Place Feet on Step/Stool Needs assistance to keep from falling or unable to try   Berg task with RW = 0    Standing Unsupported, One Foot in [employer] balance while stepping or standing   Berg task with RW = 0    Standing on One Leg Unable to try or needs assist to prevent fall   Berg task with RW = 1    Total Score 6     Berg comment: Berg tasks with RW = 13/56  CURRENT PROSTHETIC WEAR ASSESSMENT: Patient is dependent with: skin check, residual limb care, care of non-amputated limb, prosthetic cleaning, ply sock cleaning, correct ply sock adjustment, proper wear schedule/adjustment, and proper weight-bearing schedule/adjustment Donning prosthesis: Min A and maximal cueing Doffing prosthesis: SBA Prosthetic wear tolerance: 30 minutes 1-2x/day 6 of 6 days since delivery Prosthetic weight bearing tolerance: stood ~5 minutes with c/o tibial crest pain moderate level after weight bearing Edema: present with slow capillary refill Residual limb condition: 1.6 cm scab along incision, cylinderical shape, normal color & temperature, mildly dry skin, slight scar adhesion at distal tibia,  Prosthetic description: silicone liner with pin lock suspension, 2nd pelite foam liner, total contact socket, SACH foot      TODAY'S TREATMENT:  01/24/2022 Neuromuscular Re-education: Pt performed standing with BUE support with red theraband figure 8 around BLEs focus on eccentric return to bilateral stance without adducting feet: alternating LEs stepping abduction, stepping forward & extension 5 reps ea.  Standing on foam wide stance with BUE support //bars: -decreasing WBing UEs - head turns right/left & up/down with midline vision to mirror. -stepping onto foam & tapping contralateral LE to cone anterior - alternating LEs 5 reps ea -stance limb on foam & tapping contralateral LE cone anterior-lateral & 2nd cone in front of stance LE for slight adduction then stepping back to floor with foot under pelvis (not abd or add) 5 reps ea LE  Reaching to floor - placing one hand at time onto chair bottom then reaching RUE dominant to floor. Return to upright with both hands on chair bottom to //bars one hand at a time.  5 reps with verbal cues & contact assist. PT demo using same technique with locked rollator to pick up item from floor.  Pt able to return demo with verbal cues & contact assist.    01/22/2022 Prosthetic Training with left Transtibial Prosthesis & right double upright AFO: Pt continues to report limb pain limiting standing & gait.  He has slight discoloration where popliteal pad is located.  PT recommended letting prosthetist know at appt on Thursday.  PT reviewed donning again with intentionally placing pin with slight posterior tilt to pull some of his soft tissue towards tibia and seat pelite liner on limb prior to donning socket.    Therapeutic Exercise PT reviewed HEP with pt performing with correctional cues. Seated hamstring stretch, sitting with LE propped & quad set and standing gastroc stretch with 2" block.  Pt & wife reported better understanding.  Pt performed standing with BUE support with red theraband figure 8 around BLEs focus on eccentric return to bilateral stance without adducting feet:  alternating LEs stepping abduction, stepping forward & extension 5 reps ea.  His limb was sore so continued seated on 24" bar stool - stepping forward and stepping abduction 10 reps ea.       01/17/2022 Prosthetic Training with left Transtibial Prosthesis & right double upright AFO: Pt ambulated 175'  max tolerated distance rollator walker with verbal cues for RLE foot position / not adducting and upright posture looking forward / not staring at floor -Leg press DL 75# 2X15, then SL 37# on Rt 2X10, 25# on Lt X 15  -Hamstring stretching on leg press 30 sec X 2 bilat -Standing hip 4 way X 15 each plane with band around left left and X 10 each plane with band around Rt leg (more limited by pain with this one so did less reps to his tolerance) -He states he has stationary  bike at home and wanted to know if he could perform this, I had him try recumbent bike in our clinic but he is unable to keep his feet in the pedals so would not recommend this for him at home. We then did Nu step instead L5 X 10 min -Reviewed the rest of his HEP     01/15/22 0930  PT Education  Education Details Access Code: [email or web address]  Person(s) Educated Patient;Spouse  Methods Explanation;Demonstration;Tactile cues;Verbal cues;Handout  Comprehension Returned demonstration;Tactile cues required;Verbalized understanding;Verbal cues required;Need further instruction    Access Code: [email or web address] URL: [email or web address] Date: 01/15/2022 Prepared by: Dellia Donnelly  Exercises - Seated Hamstring Stretch with Strap  - 2-3 x daily - 7 x weekly - 1 sets - 3 reps - 30 seconds hold - Seated Passive Knee Extension with Weight  - 1-3 x daily - 7 x weekly - 1 sets - 1-3 reps - up to 5 minutes hold - Seated Quad Set  - 1 x daily - 7 x weekly - 1 sets - 10 reps - 5 seconds hold - Standing Gastroc Stretch on Step  - 2-3 x daily - 7 x weekly - 1 sets - 1-3 reps - 30 seconds hold     HOME EXERCISE PROGRAM:   11/22/21  1244  PT Education  Education Details HEP at sink  Person(s) Educated Patient;Spouse  Methods Explanation;Demonstration;Tactile cues;Verbal cues;Handout  Comprehension Verbalized understanding;Returned demonstration;Verbal cues required;Tactile cues required;Need further instruction    Do each exercise 1-2  times per day Do each exercise 5-10 repetitions Hold each exercise for 2 seconds to feel your location  AT SINK FIND YOUR MIDLINE POSITION AND PLACE FEET EQUAL DISTANCE FROM THE MIDLINE.  Try to find this position when standing still for activities.   USE TAPE ON FLOOR TO MARK THE MIDLINE POSITION which is even with middle of sink.  You also should try to feel with your limb pressure in socket.  You are trying to feel with limb what you used to feel with the bottom of your foot.  Side to Side Shift: Moving your hips only (not shoulders): move weight onto your left leg, HOLD/FEEL pressure in socket.  Move back to equal weight on each leg, HOLD/FEEL pressure in socket. Move weight onto your right leg, HOLD/FEEL pressure in socket. Move back to equal weight on each leg, HOLD/FEEL pressure in socket. Repeat.  Start with both hands on sink, then right hand only, then left hand only Front to Back Shift: Moving your hips only (not shoulders): move your weight forward onto your toes, HOLD/FEEL pressure in socket. Move your weight back to equal Flat Foot on both legs, HOLD/FEEL  pressure in socket. Move your weight back onto your heels, HOLD/FEEL  pressure in socket. Move your weight back to equal on both legs, HOLD/FEEL  pressure in socket. Repeat.  Start with both hands on sink, then right hand only, then left hand only  Moving Cones / Cups: With equal weight on each leg: Hold on with one hand the first time, then progress to no hand supports. Move cups from one side of sink to the other. Place cups ~2" out of your reach, progress to 10" beyond reach.  Place one hand in middle of sink and reach with  other hand. Do both arms.  Then hover one hand and move cups with other hand.  Overhead/Upward Reaching: alternated reaching up to top cabinets or ceiling if no cabinets present. Keep equal weight on each leg. Start   with one hand support on counter while other hand reaches and progress to no hand support with reaching.  ace one hand in middle of sink and reach with other hand. Do both arms.   Looking Over Shoulders: With equal weight on each leg: alternate turning to look over your shoulders with one hand support on counter as needed.  Start with head motions only to look in front of shoulder, then even with shoulder and progress to looking behind you. To look to side, move head /eyes, then shoulder on side looking pulls back, shift more weight to side looking and pull hip back. Place one hand in middle of sink and let go with other hand so your shoulder can pull back. Switch hands to look other way. If looking right, use left hand at sink. If looking left, use right hand at sink. 6.  Stepping with leg tapping forefoot into lower cabinet:  Move items under cabinet out of your way. Shift your hips/pelvis so weight on right side. Tighten muscles in hip on right side.  SLOWLY step left leg so front of foot is in cabinet. Then step back to floor.  Repeat with other leg    ASSESSMENT:   CLINICAL IMPRESSION: PT worked on balance activities on compliant surface which he improved with instruction & repetition.  PT instructed in picking up item from floor with UE support and pt improved but needed constant cues for technique.   He continues to benefit from skilled PT.    OBJECTIVE IMPAIRMENTS Abnormal gait, decreased activity tolerance, decreased balance, decreased coordination, decreased endurance, decreased knowledge of use of DME, decreased mobility, difficulty walking, decreased ROM, decreased strength, decreased safety awareness, increased edema, impaired flexibility, postural dysfunction, prosthetic  dependency , and pain.    ACTIVITY LIMITATIONS bending, sitting, standing, squatting, stairs, transfers, bathing, dressing, hygiene/grooming, locomotion level, and prosthetic use   PARTICIPATION LIMITATIONS: cleaning, laundry, and community activity   PERSONAL FACTORS Fitness, Time since onset of injury/illness/exacerbation, and 3+ comorbidities: see PMH  are also affecting patient's functional outcome.    REHAB POTENTIAL: Good   CLINICAL DECISION MAKING: Evolving/moderate complexity   EVALUATION COMPLEXITY: Moderate     GOALS: Goals reviewed with patient? Yes  UPDATED SHORT TERM GOALS: Target date: 01/11/22  Patient independent with donning/doffing prosthesis. Baseline: see objective data Goal status: MET 01/07/2022  Patient able to transfer sit to/from stand chair without armrests with RW with supervision. Baseline: see objective data Goal status: MET 01/07/2022  Patient ambulates 100' with rollator & prosthesis / AFO with supervision including ramp and curb. Baseline: see objective data Goal status: MET 01/07/2022  LONG TERM GOALS: Target date: 02/07/22   Patient wears prosthesis > 90% of waking hours with no limb pain or skin issues to promote function and safety. Baseline: see objective data Goal status: Ongoing 01/15/2022   2.  Patient independent with all aspects of prosthetic care to enable safe utilization of prosthesis. Baseline: see objective data Goal status: Ongoing 01/15/2022   3.  Patient ambulates 73' with LRAD safely and modified independent to allow for household mobility. Baseline: see objective data Goal status: Ongoing 01/15/2022   4.  Patient able to ambulate 200' and navigate ramp and curb with LRAD safely with minA/ caregiver assist to allow for community mobility. Baseline: see objective data Goal status: Ongoing 01/15/2022   5.  Berg balance score >/= 30/56 with RW to indicate low fall risk during household level balance requirements with UE  support. Baseline: see objective data  Goal status: Ongoing 01/15/2022     PLAN: PT FREQUENCY: 2x/week   PT DURATION: other: 90 days   PLANNED INTERVENTIONS: Therapeutic exercises, Therapeutic activity, Neuromuscular re-education, Balance training, Gait training, Patient/Family education, Self Care, Stair training, Vestibular training, Orthotic/Fit training, Prosthetic training, DME instructions, Moist heat, scar mobilization, Manual therapy, Re-evaluation, and physical performance tests   PLAN FOR NEXT SESSION:  work towards LTGs,   work on standing balance activities & exercises. Work on functional gait progressing distance and scanning while ambulating   Jamey Reas, PT, DPT 01/24/2022, 12:52 PM

## 2022-01-28 ENCOUNTER — Encounter: Payer: Self-pay | Admitting: Physical Therapy

## 2022-01-28 ENCOUNTER — Ambulatory Visit (INDEPENDENT_AMBULATORY_CARE_PROVIDER_SITE_OTHER): Payer: Medicare Other | Admitting: Physical Therapy

## 2022-01-28 DIAGNOSIS — R2689 Other abnormalities of gait and mobility: Secondary | ICD-10-CM

## 2022-01-28 DIAGNOSIS — M6281 Muscle weakness (generalized): Secondary | ICD-10-CM

## 2022-01-28 DIAGNOSIS — R2681 Unsteadiness on feet: Secondary | ICD-10-CM

## 2022-01-28 DIAGNOSIS — R293 Abnormal posture: Secondary | ICD-10-CM

## 2022-01-28 DIAGNOSIS — L98498 Non-pressure chronic ulcer of skin of other sites with other specified severity: Secondary | ICD-10-CM

## 2022-01-28 DIAGNOSIS — M79605 Pain in left leg: Secondary | ICD-10-CM

## 2022-01-28 NOTE — Therapy (Signed)
OUTPATIENT PHYSICAL THERAPY TREATMENT NOTE    Patient Name: [name] MRN: [id number] DOB:[birth year], 71 y.o., male Today's Date: 01/28/2022  PCP: Lorenda Hatchet, FNP REFERRING PROVIDER: [name], MD  END OF SESSION:   PT End of Session - 01/28/22 1149     Visit Number 22    Number of Visits 25    Date for PT Re-Evaluation 02/07/22    Authorization Type Medicare and BCBS    Progress Note Due on Visit 31    PT Start Time 1145    PT Stop Time 1231    PT Time Calculation (min) 46 min    Equipment Utilized During Treatment Gait belt    Activity Tolerance Patient limited by fatigue;Patient limited by pain    Behavior During Therapy [facility] for tasks assessed/performed                    Past Medical History:  Diagnosis Date   Ambulates with cane    straight cane   Anemia    Cervical myelopathy ([place]) 02/06/2018   Chronic kidney disease    dailysis M W F- home   Complication of anesthesia    Coronary artery disease    Diabetes mellitus without complication ([place])    type2   Diabetic foot ulcer ([place])    Diabetic neuropathy ([place]) 02/06/2018   Gait abnormality 08/24/2018   GERD (gastroesophageal reflux disease)     01/06/20- not current   Gout    History of blood transfusion    History of blood transfusion    History of kidney stones    passed stones   Hypercholesteremia    Hypertension    Hypothyroidism    Neuromuscular disorder ([place])    neuropathy left leg and bilateral feet   Neuropathy    PONV (postoperative nausea and vomiting)    Prostate cancer ([place])    PVD (peripheral vascular disease) ([place])    with amputations   Past Surgical History:  Procedure Laterality Date   A-FLUTTER ABLATION N/A 04/06/2020   Procedure: A-FLUTTER ABLATION;  Surgeon: [name], MD;  Location: [place] [facility];  Service: Cardiovascular;  Laterality: N/A;   AMPUTATION Left 12/25/2013   Procedure: AMPUTATION RAY LEFT 5TH RAY;  Surgeon: [name], MD;   Location: [facility];  Service: Orthopedics;  Laterality: Left;   AMPUTATION Right 12/15/2018   Procedure: AMPUTATION OF 4TH AND 5TH TOES RIGHT FOOT;  Surgeon: [name], MD;  Location: [place];  Service: Orthopedics;  Laterality: Right;   AMPUTATION Left 02/02/2021   Procedure: LEFT FOOT 4TH RAY AMPUTATION;  Surgeon: [name], MD;  Location: [place];  Service: Orthopedics;  Laterality: Left;   AMPUTATION Left 05/09/2021   Procedure: LEFT BELOW KNEE AMPUTATION;  Surgeon: [name], MD;  Location: [place];  Service: Orthopedics;  Laterality: Left;   APPLICATION OF WOUND VAC Right 12/15/2018   Procedure: APPLICATION OF WOUND VAC;  Surgeon: [name], MD;  Location: [place];  Service: Orthopedics;  Laterality: Right;   AV FISTULA PLACEMENT Left 03/25/2019   Procedure: LEFT ARM ARTERIOVENOUS (AV) FISTULA CREATION;  Surgeon: [name], MD;  Location: [place];  Service: Vascular;  Laterality: Left;   [employer] Left 05/20/2019   Procedure: SECOND STAGE LEFT BASCILIC VEIN TRANSPOSITION;  Surgeon: [name], MD;  Location: [name];  Service: Vascular;  Laterality: Left;   CARDIAC CATHETERIZATION  02/17/2014   CHOLECYSTECTOMY     COLONOSCOPY  2011   in [place], Normal   CORONARY ARTERY BYPASS GRAFT  2008   CYSTOSCOPY N/A 06/29/2020   Procedure: CYSTOSCOPY WITH CLOT EVACUATION AND  FULGERATION;  Surgeon: [name], MD;  Location: [place];  Service: Urology;  Laterality: N/A;   CYSTOSCOPY WITH FULGERATION Bilateral 06/17/2020   Procedure: CYSTOSCOPY,BILATERAL RETROGRADE, CLOT EVACUATION WITH FULGERATION OF THE BLADDER;  Surgeon: Janith Lima, MD;  Location: [place];  Service: Urology;  Laterality: Bilateral;   CYSTOSCOPY WITH FULGERATION N/A 06/28/2020   Procedure: CYSTOSCOPY WITH CLOT EVACUATION AND FULGERATION OF BLEEDERS;  Surgeon: [name], MD;  Location: [place];  Service: Urology;  Laterality: N/A;  1 HR   I & D EXTREMITY Right  12/15/2018   Procedure: DEBRIDEMENT RIGHT FOOT;  Surgeon: [name], MD;  Location: [place];  Service: Orthopedics;  Laterality: Right;   I & D EXTREMITY Right 08/27/2019   Procedure: PARTIAL CUBOID EXCISION RIGHT FOOT;  Surgeon: [name], MD;  Location: [place];  Service: Orthopedics;  Laterality: Right;   I & D EXTREMITY Right 01/07/2020   Procedure: RIGHT FOOT EXCISION INFECTED BONE;  Surgeon: [name], MD;  Location: [place];  Service: Orthopedics;  Laterality: Right;   NECK SURGERY     novemver 2019   STUMP REVISION Left 06/27/2021   Procedure: REVISION LEFT BELOW KNEE AMPUTATION;  Surgeon: [name], MD;  Location: [place];  Service: Orthopedics;  Laterality: Left;   TRANSURETHRAL RESECTION OF PROSTATE N/A 06/28/2020   Procedure: TRANSURETHRAL RESECTION OF THE PROSTATE (TURP);  Surgeon: [name], MD;  Location: [place];  Service: Urology;  Laterality: N/A;   WISDOM TOOTH EXTRACTION     Patient Active Problem List   Diagnosis Date Noted   Community acquired pneumonia of left lower lobe of lung 08/18/2021   Paroxysmal atrial fibrillation ([place]) 08/18/2021   Anemia of chronic renal failure 08/18/2021   Thrombocytopenia ([place]) 16/01/9603   Acute metabolic encephalopathy 54/12/8117   Action induced myoclonus 08/18/2021   Dehiscence of amputation stump of left lower extremity ([place])    Left below-knee amputee ([place]) 05/11/2021   S/P BKA (below knee amputation) unilateral, left ([place]) 05/09/2021   Partial nontraumatic amputation of foot, left ([place]) 02/20/2021   Abscess of bursa of left foot 02/03/2021   Cutaneous abscess of left foot    Acute osteomyelitis of metatarsal bone of left foot ([facility])    Symptomatic anemia 09/13/2020   COVID-19 virus infection 09/13/2020   Pressure injury of skin 06/29/2020   Gross hematuria 06/16/2020   Typical atrial flutter ([place]) 03/24/2020   Bilateral pleural effusion 02/24/2020   Healthcare maintenance 01/28/2020   Shortness of breath  01/28/2020   History of partial ray amputation of fourth toe of right foot ([place]) 09/08/2019   Ulcerated, foot, right, with necrosis of bone (H[facility])    Chronic cough 06/25/2019   Cutaneous abscess of right foot    Subacute osteomyelitis of right foot ([place]) 12/14/2018   AKI (acute kidney injury) ([place]) 12/14/2018   ESRD on hemodialysis ([place]) 12/14/2018   Gait abnormality 08/24/2018   Diabetic neuropathy ([place]) 02/06/2018   Cervical myelopathy ([place]) 02/06/2018   Onychomycosis 10/30/2017   Other spondylosis with radiculopathy, cervical region 01/27/2017   Midfoot ulcer, right, limited to breakdown of skin ([place]) 11/15/2016   Lateral epicondylitis, left elbow 08/12/2016   Cellulitis of fifth toe of right foot 07/29/2016   Prostate cancer ([place]) 06/19/2016  Non-pressure chronic ulcer of other part of right foot limited to breakdown of skin ([place]) 04/04/2016   Cellulitis of leg, right 12/24/2015   Cellulitis of right lower extremity 12/24/2015   Gout 09/14/2012   Hypertension associated with diabetes ([place]) 09/14/2012   Hypothyroidism 09/14/2012   CAD (coronary artery disease) 09/14/2012   Insulin dependent type 2 diabetes mellitus ([place]) 09/14/2012   Hyperlipidemia associated with type 2 diabetes mellitus ([place])    Neuropathy ([place])     REFERRING DIAG: G92.119E (ICD-10-CM) - Below-knee amputation of left lower extremity  THERAPY DIAG:  Unsteadiness on feet  Other abnormalities of gait and mobility  Abnormal posture  Muscle weakness (generalized)  Pain in left leg  Non-pressure chronic ulcer of skin of other sites with other specified severity ([place])  Rationale for Evaluation and Treatment Rehabilitation  PERTINENT HISTORY: braced right foot with cavovarus collapse, CAP without hypoxia, acute metabolic encephalopathy, ESRD on HD Sun/W/F, afib s/p ablation, DM II, neuropathy, HTN, HLD, hypothyroidism, anemia, prostate cancer s/p TUPP. cervical myelopathy, gout, CAD w/CABG  2005  PRECAUTIONS: no BP in LUE, fall risk   SUBJECTIVE:   He is uncertain about discharge next week.  He is using walker & trying to improve distance & frequency. He went to gym this morning doing upper body.   PAIN:  Are you having pain?  Yes seated with prosthesis  0.5-1/10,  standing 1-2/10, walking 2-3/10 increasing with distance, highest since last PT 4-5/10 Location: pain in distal lateral shin  Aggravating factor: wearing & weight bearing on prosthesis increases with time & distance  Alleviating factor: removing prosthesis   OBJECTIVE: (objective measures completed at initial evaluation unless otherwise dated) COGNITION: Overall cognitive status: Within functional limits for tasks assessed              SENSATION: Not tested   POSTURE: rounded shoulders, forward head, and anterior pelvic tilt - RLE resting position with tibial IR, severe foot supination and inversion with cavovarus collapse  PALPATION 12/17/21 Tenderness to palpation at distal lateral tibia, potential bone spur formation at distal lateral tibia   LOWER EXTREMITY ROM:   ROM (A: AROM, P: PROM) Right eval Left eval Left 01/15/22  Hip flexion       Hip extension       Hip abduction       Hip adduction       Hip internal rotation       Hip external rotation       Knee flexion       Knee extension   Standing with RW and prosthesis  A: -16* Seated P: -12* without hamstring tension  Hamstring tightness   Seated P: -19*  Ankle dorsiflexion       Ankle plantarflexion       Ankle inversion       Ankle eversion        (Blank rows = not tested)   LOWER EXTREMITY MMT:   MMT Right eval Left eval  Hip flexion      Hip extension      Hip abduction      Hip adduction      Hip internal rotation      Hip external rotation      Knee flexion      Knee extension      Ankle dorsiflexion      Ankle plantarflexion      Ankle inversion      Ankle eversion      (Blank rows =  not tested)   Functional  strength with standing and transfers 3- at hip and knee bil., with no motion at Rt ankle   TRANSFERS: Sit to stand: minA - fell backwards into chair upon first attempt, MinA for next attempt with RW stabilization and verbal cueing for foot placement Stand to sit: minA for controlling descent and stabilization   GAIT: Gait pattern:  adduction RLE with toe-in, step to pattern, decreased step length- Right, decreased stance time- Right, decreased stride length, decreased hip/knee flexion- Left, decreased ankle dorsiflexion- Right, Left hip hike, Right foot flat, knee flexed in stance- Right, knee flexed in stance- Left, lateral hip instability, lateral lean- Right, trunk flexed, and poor foot clearance- Right Distance walked: 35' Assistive device utilized: Environmental consultant - 2 wheeled & Left TTA prosthesis, right double upright AFO custom shoe Level of assistance: MinA to recover losses of balance with straight surface walking, ModA to recover losses of balance turning Lt, MaxA with loss of balance during turning Rt Comments: Rt lower leg positioning reduced foot clearance with catching Lt heel most steps, and promoted Rt lateral leaning/lateral loss of balance. Requires excessive BUE support on RW.   FUNCTIONAL TESTs:  Berg Balance Scale: 6/56, 13/56 with RW   [facility] PT Assessment - 11/13/21 0001                Standardized Balance Assessment    Standardized Balance Assessment Berg Balance Test          Berg Balance Test    Sit to Stand Needs moderate or maximal assist to stand   Berg task with RW = 1    Standing Unsupported Unable to stand 30 seconds unassisted   Berg task with RW = 2    Sitting with Back Unsupported but Feet Supported on Floor or Stool Able to sit safely and securely 2 minutes     Stand to Sit Needs assistance to sit   Berg task with RW = 2    Transfers Able to transfer with verbal cueing and /or supervision     Standing Unsupported with Eyes Closed Needs help to keep from falling    Berg task with RW = 0    Standing Unsupported with Feet Together Needs help to attain position and unable to hold for 15 seconds   Berg task with RW = 0    From Standing, Reach Forward with Outstretched Arm Loses balance while trying/requires external support   Berg task with RW = 1 (CGA reaching with Lt)    From Standing Position, Pick up Object from Floor Unable to try/needs assist to keep balance   Berg task with RW = 0    From Standing Position, Turn to Look Behind Over each Shoulder Needs assist to keep from losing balance and falling   Berg task with RW = 0    Turn 360 Degrees Needs assistance while turning   Berg task with RW = 0    Standing Unsupported, Alternately Place Feet on Step/Stool Needs assistance to keep from falling or unable to try   Berg task with RW = 0    Standing Unsupported, One Foot in [employer] balance while stepping or standing   Berg task with RW = 0    Standing on One Leg Unable to try or needs assist to prevent fall   Berg task with RW = 1    Total Score 6     Berg comment: Berg tasks with RW = 13/56  CURRENT PROSTHETIC WEAR ASSESSMENT: Patient is dependent with: skin check, residual limb care, care of non-amputated limb, prosthetic cleaning, ply sock cleaning, correct ply sock adjustment, proper wear schedule/adjustment, and proper weight-bearing schedule/adjustment Donning prosthesis: Min A and maximal cueing Doffing prosthesis: SBA Prosthetic wear tolerance: 30 minutes 1-2x/day 6 of 6 days since delivery Prosthetic weight bearing tolerance: stood ~5 minutes with c/o tibial crest pain moderate level after weight bearing Edema: present with slow capillary refill Residual limb condition: 1.6 cm scab along incision, cylinderical shape, normal color & temperature, mildly dry skin, slight scar adhesion at distal tibia,  Prosthetic description: silicone liner with pin lock suspension, 2nd pelite foam liner, total contact socket, SACH foot      TODAY'S TREATMENT:  01/28/2022 Discussion with PT, pt & his wife on need for potential for functional progress to justify additional PT beyond plan of care. This is Medicare / insurance requirement.  Neuromuscular Re-education: Pt performed standing with BUE support with red theraband figure 8 around BLEs focus on eccentric return to bilateral stance without adducting feet: alternating LEs stepping abduction, stepping forward & extension 5 reps ea.  Standing on foam wide stance with BUE support //bars: -decreasing WBing UEs - head turns right/left & up/down with midline vision to mirror. -stepping onto foam & tapping contralateral LE to cone anterior - alternating LEs 5 reps ea -stance limb on foam & tapping contralateral LE cone anterior-lateral & 2nd cone in front of stance LE for slight adduction then stepping back to floor with foot under pelvis (not abd or add) 5 reps ea LE  Reaching to floor - placing one hand at time onto chair bottom then reaching RUE dominant to floor. Return to upright with both hands on chair bottom to //bars one hand at a time.  5 reps with verbal cues & contact assist. PT demo using same technique with locked rollator to pick up item from floor.  Pt able to return demo with verbal cues & contact assist.   01/24/2022 Neuromuscular Re-education: Pt performed standing with BUE support with red theraband figure 8 around BLEs focus on eccentric return to bilateral stance without adducting feet: alternating LEs stepping abduction, stepping forward & extension 5 reps ea.  Standing on foam wide stance with BUE support //bars: -decreasing WBing UEs - head turns right/left & up/down with midline vision to mirror. -stepping onto foam & tapping contralateral LE to cone anterior - alternating LEs 5 reps ea -stance limb on foam & tapping contralateral LE cone anterior-lateral & 2nd cone in front of stance LE for slight adduction then stepping back to floor with foot under  pelvis (not abd or add) 5 reps ea LE  Reaching to floor - placing one hand at time onto chair bottom then reaching RUE dominant to floor. Return to upright with both hands on chair bottom to //bars one hand at a time.  5 reps with verbal cues & contact assist. PT demo using same technique with locked rollator to pick up item from floor.  Pt able to return demo with verbal cues & contact assist.    01/22/2022 Prosthetic Training with left Transtibial Prosthesis & right double upright AFO: Pt continues to report limb pain limiting standing & gait.  He has slight discoloration where popliteal pad is located.  PT recommended letting prosthetist know at appt on Thursday.  PT reviewed donning again with intentionally placing pin with slight posterior tilt to pull some of his soft tissue towards tibia and seat pelite liner  on limb prior to donning socket.    Therapeutic Exercise PT reviewed HEP with pt performing with correctional cues. Seated hamstring stretch, sitting with LE propped & quad set and standing gastroc stretch with 2" block.  Pt & wife reported better understanding.  Pt performed standing with BUE support with red theraband figure 8 around BLEs focus on eccentric return to bilateral stance without adducting feet: alternating LEs stepping abduction, stepping forward & extension 5 reps ea.  His limb was sore so continued seated on 24" bar stool - stepping forward and stepping abduction 10 reps ea.         01/15/22 0930  PT Education  Education Details Access Code: [email or web address]  Person(s) Educated Patient;Spouse  Methods Explanation;Demonstration;Tactile cues;Verbal cues;Handout  Comprehension Returned demonstration;Tactile cues required;Verbalized understanding;Verbal cues required;Need further instruction    Access Code: [email or web address] URL: [name] Date: 01/15/2022 Prepared by: Jamey Reas  Exercises - Seated Hamstring Stretch with Strap  - 2-3 x daily - 7 x  weekly - 1 sets - 3 reps - 30 seconds hold - Seated Passive Knee Extension with Weight  - 1-3 x daily - 7 x weekly - 1 sets - 1-3 reps - up to 5 minutes hold - Seated Quad Set  - 1 x daily - 7 x weekly - 1 sets - 10 reps - 5 seconds hold - Standing Gastroc Stretch on Step  - 2-3 x daily - 7 x weekly - 1 sets - 1-3 reps - 30 seconds hold     HOME EXERCISE PROGRAM:   11/22/21 1244  PT Education  Education Details HEP at sink  Person(s) Educated Patient;Spouse  Methods Explanation;Demonstration;Tactile cues;Verbal cues;Handout  Comprehension Verbalized understanding;Returned demonstration;Verbal cues required;Tactile cues required;Need further instruction    Do each exercise 1-2  times per day Do each exercise 5-10 repetitions Hold each exercise for 2 seconds to feel your location  AT [place].  Try to find this position when standing still for activities.   USE TAPE ON FLOOR TO MARK THE MIDLINE POSITION which is even with middle of sink.  You also should try to feel with your limb pressure in socket.  You are trying to feel with limb what you used to feel with the bottom of your foot.  Side to Side Shift: Moving your hips only (not shoulders): move weight onto your left leg, HOLD/FEEL pressure in socket.  Move back to equal weight on each leg, HOLD/FEEL pressure in socket. Move weight onto your right leg, HOLD/FEEL pressure in socket. Move back to equal weight on each leg, HOLD/FEEL pressure in socket. Repeat.  Start with both hands on sink, then right hand only, then left hand only Front to Back Shift: Moving your hips only (not shoulders): move your weight forward onto your toes, HOLD/FEEL pressure in socket. Move your weight back to equal Flat Foot on both legs, HOLD/FEEL  pressure in socket. Move your weight back onto your heels, HOLD/FEEL  pressure in socket. Move your weight back to equal on both legs, HOLD/FEEL   pressure in socket. Repeat.  Start with both hands on sink, then right hand only, then left hand only  Moving Cones / Cups: With equal weight on each leg: Hold on with one hand the first time, then progress to no hand supports. Move cups from one side of sink to the other. Place cups ~2" out of your reach, progress to 10" beyond reach.  Place  one hand in middle of sink and reach with other hand. Do both arms.  Then hover one hand and move cups with other hand.  Overhead/Upward Reaching: alternated reaching up to top cabinets or ceiling if no cabinets present. Keep equal weight on each leg. Start with one hand support on counter while other hand reaches and progress to no hand support with reaching.  ace one hand in middle of sink and reach with other hand. Do both arms.   Looking Over Shoulders: With equal weight on each leg: alternate turning to look over your shoulders with one hand support on counter as needed.  Start with head motions only to look in front of shoulder, then even with shoulder and progress to looking behind you. To look to side, move head /eyes, then shoulder on side looking pulls back, shift more weight to side looking and pull hip back. Place one hand in middle of sink and let go with other hand so your shoulder can pull back. Switch hands to look other way. If looking right, use left hand at sink. If looking left, use right hand at sink. 6.  Stepping with leg tapping forefoot into lower cabinet:  Move items under cabinet out of your way. Shift your hips/pelvis so weight on right side. Tighten muscles in hip on right side.  SLOWLY step left leg so front of foot is in cabinet. Then step back to floor.  Repeat with other leg    ASSESSMENT:   CLINICAL IMPRESSION: Patient appears to be plateau with functional activities for current level.  Pt does not appear safe for gait with cane due to high fall risk.  PT worked on balance activities on compliant surface which he improved with  instruction & repetition.  PT improved picking up item from floor with UE support but needed constant cues for technique.   He continues to benefit from skilled PT.    OBJECTIVE IMPAIRMENTS Abnormal gait, decreased activity tolerance, decreased balance, decreased coordination, decreased endurance, decreased knowledge of use of DME, decreased mobility, difficulty walking, decreased ROM, decreased strength, decreased safety awareness, increased edema, impaired flexibility, postural dysfunction, prosthetic dependency , and pain.    ACTIVITY LIMITATIONS bending, sitting, standing, squatting, stairs, transfers, bathing, dressing, hygiene/grooming, locomotion level, and prosthetic use   PARTICIPATION LIMITATIONS: cleaning, laundry, and community activity   PERSONAL FACTORS Fitness, Time since onset of injury/illness/exacerbation, and 3+ comorbidities: see PMH  are also affecting patient's functional outcome.    REHAB POTENTIAL: Good   CLINICAL DECISION MAKING: Evolving/moderate complexity   EVALUATION COMPLEXITY: Moderate     GOALS: Goals reviewed with patient? Yes  UPDATED SHORT TERM GOALS: Target date: 01/11/22  Patient independent with donning/doffing prosthesis. Baseline: see objective data Goal status: MET 01/07/2022  Patient able to transfer sit to/from stand chair without armrests with RW with supervision. Baseline: see objective data Goal status: MET 01/07/2022  Patient ambulates 100' with rollator & prosthesis / AFO with supervision including ramp and curb. Baseline: see objective data Goal status: MET 01/07/2022  LONG TERM GOALS: Target date: 02/07/22   Patient wears prosthesis > 90% of waking hours with no limb pain or skin issues to promote function and safety. Baseline: see objective data Goal status: Ongoing 01/15/2022   2.  Patient independent with all aspects of prosthetic care to enable safe utilization of prosthesis. Baseline: see objective data Goal status: Ongoing  01/15/2022   3.  Patient ambulates 27' with LRAD safely and modified independent to allow for household mobility.  Baseline: see objective data Goal status: Ongoing 01/15/2022   4.  Patient able to ambulate 200' and navigate ramp and curb with LRAD safely with minA/ caregiver assist to allow for community mobility. Baseline: see objective data Goal status: Ongoing 01/15/2022   5.  Berg balance score >/= 30/56 with RW to indicate low fall risk during household level balance requirements with UE support. Baseline: see objective data Goal status: Ongoing 01/15/2022     PLAN: PT FREQUENCY: 2x/week   PT DURATION: other: 90 days   PLANNED INTERVENTIONS: Therapeutic exercises, Therapeutic activity, Neuromuscular re-education, Balance training, Gait training, Patient/Family education, Self Care, Stair training, Vestibular training, Orthotic/Fit training, Prosthetic training, DME instructions, Moist heat, scar mobilization, Manual therapy, Re-evaluation, and physical performance tests   PLAN FOR NEXT SESSION:  continue work towards LTGs,   work on standing balance activities & exercises. Work on functional gait progressing distance and scanning while ambulating   Jamey Reas, PT, DPT 01/28/2022, 12:53 PM

## 2022-01-31 ENCOUNTER — Encounter: Payer: Self-pay | Admitting: Physical Therapy

## 2022-01-31 ENCOUNTER — Ambulatory Visit (INDEPENDENT_AMBULATORY_CARE_PROVIDER_SITE_OTHER): Payer: Medicare Other | Admitting: Physical Therapy

## 2022-01-31 DIAGNOSIS — R2681 Unsteadiness on feet: Secondary | ICD-10-CM

## 2022-01-31 DIAGNOSIS — R2689 Other abnormalities of gait and mobility: Secondary | ICD-10-CM | POA: Diagnosis not present

## 2022-01-31 DIAGNOSIS — M25662 Stiffness of left knee, not elsewhere classified: Secondary | ICD-10-CM

## 2022-01-31 DIAGNOSIS — R293 Abnormal posture: Secondary | ICD-10-CM

## 2022-01-31 DIAGNOSIS — M79605 Pain in left leg: Secondary | ICD-10-CM

## 2022-01-31 DIAGNOSIS — M6281 Muscle weakness (generalized): Secondary | ICD-10-CM | POA: Diagnosis not present

## 2022-01-31 DIAGNOSIS — L98498 Non-pressure chronic ulcer of skin of other sites with other specified severity: Secondary | ICD-10-CM

## 2022-01-31 NOTE — Therapy (Signed)
OUTPATIENT PHYSICAL THERAPY TREATMENT NOTE    Patient Name: [name] MRN: [id number] DOB:April 24, 1950, 71 y.o., male Today's Date: 01/31/2022  PCP: Lorenda Hatchet, FNP REFERRING PROVIDER: [name], MD  END OF SESSION:   PT End of Session - 01/31/22 1151     Visit Number 23    Number of Visits 25    Date for PT Re-Evaluation 02/07/22    Authorization Type Medicare and BCBS    Progress Note Due on Visit 99    PT Start Time 1145    PT Stop Time 1231    PT Time Calculation (min) 46 min    Equipment Utilized During Treatment Gait belt    Activity Tolerance Patient limited by fatigue;Patient limited by pain    Behavior During Therapy [name] for tasks assessed/performed                     Past Medical History:  Diagnosis Date   Ambulates with cane    straight cane   Anemia    Cervical myelopathy ([place]) 02/06/2018   Chronic kidney disease    dailysis M W F- home   Complication of anesthesia    Coronary artery disease    Diabetes mellitus without complication ([place])    type2   Diabetic foot ulcer ([place])    Diabetic neuropathy ([place]) 02/06/2018   Gait abnormality 08/24/2018   GERD (gastroesophageal reflux disease)     01/06/20- not current   Gout    History of blood transfusion    History of blood transfusion    History of kidney stones    passed stones   Hypercholesteremia    Hypertension    Hypothyroidism    Neuromuscular disorder ([place])    neuropathy left leg and bilateral feet   Neuropathy    PONV (postoperative nausea and vomiting)    Prostate cancer ([place])    PVD (peripheral vascular disease) ([place])    with amputations   Past Surgical History:  Procedure Laterality Date   A-FLUTTER ABLATION N/A 04/06/2020   Procedure: A-FLUTTER ABLATION;  Surgeon: [name], MD;  Location: [facility];  Service: Cardiovascular;  Laterality: N/A;   AMPUTATION Left 12/25/2013   Procedure: AMPUTATION RAY LEFT 5TH RAY;  Surgeon: [name], MD;   Location: [facility];  Service: Orthopedics;  Laterality: Left;   AMPUTATION Right 12/15/2018   Procedure: AMPUTATION OF 4TH AND 5TH TOES RIGHT FOOT;  Surgeon: [name], MD;  Location: [place];  Service: Orthopedics;  Laterality: Right;   AMPUTATION Left 02/02/2021   Procedure: LEFT FOOT 4TH RAY AMPUTATION;  Surgeon: [name], MD;  Location: [place];  Service: Orthopedics;  Laterality: Left;   AMPUTATION Left 05/09/2021   Procedure: LEFT BELOW KNEE AMPUTATION;  Surgeon: [name], MD;  Location: [place];  Service: Orthopedics;  Laterality: Left;   APPLICATION OF WOUND VAC Right 12/15/2018   Procedure: APPLICATION OF WOUND VAC;  Surgeon: [name], MD;  Location: [place];  Service: Orthopedics;  Laterality: Right;   AV FISTULA PLACEMENT Left 03/25/2019   Procedure: LEFT ARM ARTERIOVENOUS (AV) FISTULA CREATION;  Surgeon: [name], MD;  Location: [place];  Service: Vascular;  Laterality: Left;   [place] Left 05/20/2019   Procedure: SECOND STAGE LEFT BASCILIC VEIN TRANSPOSITION;  Surgeon: [name], MD;  Location: [place];  Service: Vascular;  Laterality: Left;   CARDIAC CATHETERIZATION  02/17/2014   CHOLECYSTECTOMY     COLONOSCOPY  2011   in [place], Normal   CORONARY ARTERY BYPASS GRAFT  2008   CYSTOSCOPY N/A 06/29/2020   Procedure: CYSTOSCOPY WITH CLOT EVACUATION AND  FULGERATION;  Surgeon: [name], MD;  Location: [place];  Service: Urology;  Laterality: N/A;   CYSTOSCOPY WITH FULGERATION Bilateral 06/17/2020   Procedure: CYSTOSCOPY,BILATERAL RETROGRADE, CLOT EVACUATION WITH FULGERATION OF THE BLADDER;  Surgeon: Janith Lima, MD;  Location: [place];  Service: Urology;  Laterality: Bilateral;   CYSTOSCOPY WITH FULGERATION N/A 06/28/2020   Procedure: CYSTOSCOPY WITH CLOT EVACUATION AND FULGERATION OF BLEEDERS;  Surgeon: [name], MD;  Location: [place];  Service: Urology;  Laterality: N/A;  1 HR   I & D EXTREMITY Right  12/15/2018   Procedure: DEBRIDEMENT RIGHT FOOT;  Surgeon: [name], MD;  Location: [place];  Service: Orthopedics;  Laterality: Right;   I & D EXTREMITY Right 08/27/2019   Procedure: PARTIAL CUBOID EXCISION RIGHT FOOT;  Surgeon: [name], MD;  Location: [place];  Service: Orthopedics;  Laterality: Right;   I & D EXTREMITY Right 01/07/2020   Procedure: RIGHT FOOT EXCISION INFECTED BONE;  Surgeon: [name], MD;  Location: [place];  Service: Orthopedics;  Laterality: Right;   NECK SURGERY     novemver 2019   STUMP REVISION Left 06/27/2021   Procedure: REVISION LEFT BELOW KNEE AMPUTATION;  Surgeon: [name], MD;  Location: [place];  Service: Orthopedics;  Laterality: Left;   TRANSURETHRAL RESECTION OF PROSTATE N/A 06/28/2020   Procedure: TRANSURETHRAL RESECTION OF THE PROSTATE (TURP);  Surgeon: [name], MD;  Location: [place];  Service: Urology;  Laterality: N/A;   WISDOM TOOTH EXTRACTION     Patient Active Problem List   Diagnosis Date Noted   Community acquired pneumonia of left lower lobe of lung 08/18/2021   Paroxysmal atrial fibrillation ([place]) 08/18/2021   Anemia of chronic renal failure 08/18/2021   Thrombocytopenia ([place]) 16/01/9603   Acute metabolic encephalopathy 54/12/8117   Action induced myoclonus 08/18/2021   Dehiscence of amputation stump of left lower extremity ([place])    Left below-knee amputee ([place]) 05/11/2021   S/P BKA (below knee amputation) unilateral, left ([place]) 05/09/2021   Partial nontraumatic amputation of foot, left ([place]) 02/20/2021   Abscess of bursa of left foot 02/03/2021   Cutaneous abscess of left foot    Acute osteomyelitis of metatarsal bone of left foot (HCC)    Symptomatic anemia 09/13/2020   COVID-19 virus infection 09/13/2020   Pressure injury of skin 06/29/2020   Gross hematuria 06/16/2020   Typical atrial flutter ([place]) 03/24/2020   Bilateral pleural effusion 02/24/2020   Healthcare maintenance 01/28/2020   Shortness of breath  01/28/2020   History of partial ray amputation of fourth toe of right foot ([place]) 09/08/2019   Ulcerated, foot, right, with necrosis of bone (HCC)    Chronic cough 06/25/2019   Cutaneous abscess of right foot    Subacute osteomyelitis of right foot ([place]) 12/14/2018   AKI (acute kidney injury) ([place]) 12/14/2018   ESRD on hemodialysis ([place]) 12/14/2018   Gait abnormality 08/24/2018   Diabetic neuropathy ([place]) 02/06/2018   Cervical myelopathy ([place]) 02/06/2018   Onychomycosis 10/30/2017   Other spondylosis with radiculopathy, cervical region 01/27/2017   Midfoot ulcer, right, limited to breakdown of skin ([place]) 11/15/2016   Lateral epicondylitis, left elbow 08/12/2016   Cellulitis of fifth toe of right foot 07/29/2016   Prostate cancer ([place]) 06/19/2016  Non-pressure chronic ulcer of other part of right foot limited to breakdown of skin ([place]) 04/04/2016   Cellulitis of leg, right 12/24/2015   Cellulitis of right lower extremity 12/24/2015   Gout 09/14/2012   Hypertension associated with diabetes ([place]) 09/14/2012   Hypothyroidism 09/14/2012   CAD (coronary artery disease) 09/14/2012   Insulin dependent type 2 diabetes mellitus ([place]) 09/14/2012   Hyperlipidemia associated with type 2 diabetes mellitus ([place])    Neuropathy ([place])     REFERRING DIAG: W73.710G (ICD-10-CM) - Below-knee amputation of left lower extremity  THERAPY DIAG:  Unsteadiness on feet  Other abnormalities of gait and mobility  Abnormal posture  Muscle weakness (generalized)  Pain in left leg  Non-pressure chronic ulcer of skin of other sites with other specified severity (HCC)  Stiffness of left knee, not elsewhere classified  Rationale for Evaluation and Treatment Rehabilitation  PERTINENT HISTORY: braced right foot with cavovarus collapse, CAP without hypoxia, acute metabolic encephalopathy, ESRD on HD Sun/W/F, afib s/p ablation, DM II, neuropathy, HTN, HLD, hypothyroidism, anemia, prostate cancer s/p TUPP.  cervical myelopathy, gout, CAD w/CABG 2005  PRECAUTIONS: no BP in LUE, fall risk   SUBJECTIVE:  The limb still hurts the same.    PAIN:  Are you having pain?  Yes seated with prosthesis  0.5-1/10,  standing 1-2/10, walking 2-3/10 increasing with distance, highest since last PT 4-5/10 Location: pain in distal lateral shin  Aggravating factor: wearing & weight bearing on prosthesis increases with time & distance  Alleviating factor: removing prosthesis   OBJECTIVE: (objective measures completed at initial evaluation unless otherwise dated) COGNITION: Overall cognitive status: Within functional limits for tasks assessed              SENSATION: Not tested   POSTURE: rounded shoulders, forward head, and anterior pelvic tilt - RLE resting position with tibial IR, severe foot supination and inversion with cavovarus collapse  PALPATION 12/17/21 Tenderness to palpation at distal lateral tibia, potential bone spur formation at distal lateral tibia   LOWER EXTREMITY ROM:   ROM (A: AROM, P: PROM) Right eval Left eval Left 01/15/22  Hip flexion       Hip extension       Hip abduction       Hip adduction       Hip internal rotation       Hip external rotation       Knee flexion       Knee extension   Standing with RW and prosthesis  A: -16* Seated P: -12* without hamstring tension  Hamstring tightness   Seated P: -19*  Ankle dorsiflexion       Ankle plantarflexion       Ankle inversion       Ankle eversion        (Blank rows = not tested)   LOWER EXTREMITY MMT:   MMT Right eval Left eval  Hip flexion      Hip extension      Hip abduction      Hip adduction      Hip internal rotation      Hip external rotation      Knee flexion      Knee extension      Ankle dorsiflexion      Ankle plantarflexion      Ankle inversion      Ankle eversion      (Blank rows = not tested)   Functional strength with standing and transfers 3- at hip  and knee bil., with no motion at Rt  ankle   TRANSFERS: Sit to stand: minA - fell backwards into chair upon first attempt, MinA for next attempt with RW stabilization and verbal cueing for foot placement Stand to sit: minA for controlling descent and stabilization   GAIT: Gait pattern:  adduction RLE with toe-in, step to pattern, decreased step length- Right, decreased stance time- Right, decreased stride length, decreased hip/knee flexion- Left, decreased ankle dorsiflexion- Right, Left hip hike, Right foot flat, knee flexed in stance- Right, knee flexed in stance- Left, lateral hip instability, lateral lean- Right, trunk flexed, and poor foot clearance- Right Distance walked: 35' Assistive device utilized: Environmental consultant - 2 wheeled & Left TTA prosthesis, right double upright AFO custom shoe Level of assistance: MinA to recover losses of balance with straight surface walking, ModA to recover losses of balance turning Lt, MaxA with loss of balance during turning Rt Comments: Rt lower leg positioning reduced foot clearance with catching Lt heel most steps, and promoted Rt lateral leaning/lateral loss of balance. Requires excessive BUE support on RW.   FUNCTIONAL TESTs:  Berg Balance Scale: 6/56, 13/56 with RW   [facility] PT Assessment - 11/13/21 0001                Standardized Balance Assessment    Standardized Balance Assessment Berg Balance Test          Berg Balance Test    Sit to Stand Needs moderate or maximal assist to stand   Berg task with RW = 1    Standing Unsupported Unable to stand 30 seconds unassisted   Berg task with RW = 2    Sitting with Back Unsupported but Feet Supported on Floor or Stool Able to sit safely and securely 2 minutes     Stand to Sit Needs assistance to sit   Berg task with RW = 2    Transfers Able to transfer with verbal cueing and /or supervision     Standing Unsupported with Eyes Closed Needs help to keep from falling   Berg task with RW = 0    Standing Unsupported with Feet Together Needs help to  attain position and unable to hold for 15 seconds   Berg task with RW = 0    From Standing, Reach Forward with Outstretched Arm Loses balance while trying/requires external support   Berg task with RW = 1 (CGA reaching with Lt)    From Standing Position, Pick up Object from Floor Unable to try/needs assist to keep balance   Berg task with RW = 0    From Standing Position, Turn to Look Behind Over each Shoulder Needs assist to keep from losing balance and falling   Berg task with RW = 0    Turn 360 Degrees Needs assistance while turning   Berg task with RW = 0    Standing Unsupported, Alternately Place Feet on Step/Stool Needs assistance to keep from falling or unable to try   Berg task with RW = 0    Standing Unsupported, One Foot in [employer] balance while stepping or standing   Berg task with RW = 0    Standing on One Leg Unable to try or needs assist to prevent fall   Berg task with RW = 1    Total Score 6     Berg comment: Berg tasks with RW = 13/56  CURRENT PROSTHETIC WEAR ASSESSMENT: Patient is dependent with: skin check, residual limb care, care of non-amputated limb, prosthetic cleaning, ply sock cleaning, correct ply sock adjustment, proper wear schedule/adjustment, and proper weight-bearing schedule/adjustment Donning prosthesis: Min A and maximal cueing Doffing prosthesis: SBA Prosthetic wear tolerance: 30 minutes 1-2x/day 6 of 6 days since delivery Prosthetic weight bearing tolerance: stood ~5 minutes with c/o tibial crest pain moderate level after weight bearing Edema: present with slow capillary refill Residual limb condition: 1.6 cm scab along incision, cylinderical shape, normal color & temperature, mildly dry skin, slight scar adhesion at distal tibia,  Prosthetic description: silicone liner with pin lock suspension, 2nd pelite foam liner, total contact socket, SACH foot     TODAY'S TREATMENT:  01/31/2022 Neuromuscular Re-education: Stance LE  crossways on beam stepping contralateral LE over & back beam without adducting (line on floor) 10 reps ea. Standing crossways on foam beam with BUE support cues to minimize weight bearing with head turns right/left, up/down & diagonals with tactile, verbal & visual cues for upright posture between reps. Standing trunk flexion reaching UEs to chair bottom in front (switching lead UE) then reaching to floor switching with which UE then returning to upright.  5 reps leading with ea UE. Tactile & verbal cues.  Prosthetic Training: PT instructed in donning prosthesis seating limb into socket properly / not stomping foot on floor or bouncing in standing.   PT demo & verbal cues on negotiating stairs with single rail (BUE support) side stepping. Pt performed with 3 steps with minA & cues with rail on right & on left.  He had better control & less pain with rail on left (ascending side).    01/28/2022 Discussion with PT, pt & his wife on need for potential for functional progress to justify additional PT beyond plan of care. This is Medicare / insurance requirement.  Neuromuscular Re-education: Pt performed standing with BUE support with red theraband figure 8 around BLEs focus on eccentric return to bilateral stance without adducting feet: alternating LEs stepping abduction, stepping forward & extension 5 reps ea.  Standing on foam wide stance with BUE support //bars: -decreasing WBing UEs - head turns right/left & up/down with midline vision to mirror. -stepping onto foam & tapping contralateral LE to cone anterior - alternating LEs 5 reps ea -stance limb on foam & tapping contralateral LE cone anterior-lateral & 2nd cone in front of stance LE for slight adduction then stepping back to floor with foot under pelvis (not abd or add) 5 reps ea LE  Reaching to floor - placing one hand at time onto chair bottom then reaching RUE dominant to floor. Return to upright with both hands on chair bottom to //bars  one hand at a time.  5 reps with verbal cues & contact assist. PT demo using same technique with locked rollator to pick up item from floor.  Pt able to return demo with verbal cues & contact assist.   01/24/2022 Neuromuscular Re-education: Pt performed standing with BUE support with red theraband figure 8 around BLEs focus on eccentric return to bilateral stance without adducting feet: alternating LEs stepping abduction, stepping forward & extension 5 reps ea.  Standing on foam wide stance with BUE support //bars: -decreasing WBing UEs - head turns right/left & up/down with midline vision to mirror. -stepping onto foam & tapping contralateral LE to cone anterior - alternating LEs 5 reps ea -stance limb on foam & tapping contralateral LE cone anterior-lateral & 2nd cone in front of  stance LE for slight adduction then stepping back to floor with foot under pelvis (not abd or add) 5 reps ea LE  Reaching to floor - placing one hand at time onto chair bottom then reaching RUE dominant to floor. Return to upright with both hands on chair bottom to //bars one hand at a time.  5 reps with verbal cues & contact assist. PT demo using same technique with locked rollator to pick up item from floor.  Pt able to return demo with verbal cues & contact assist.    HOME EXERCISE PROGRAM:  Access Code: [email or web address] URL: [email or web address] Date: 01/15/2022 Prepared by: Jamey Reas  Exercises - Seated Hamstring Stretch with Strap  - 2-3 x daily - 7 x weekly - 1 sets - 3 reps - 30 seconds hold - Seated Passive Knee Extension with Weight  - 1-3 x daily - 7 x weekly - 1 sets - 1-3 reps - up to 5 minutes hold - Seated Quad Set  - 1 x daily - 7 x weekly - 1 sets - 10 reps - 5 seconds hold - Standing Gastroc Stretch on Step  - 2-3 x daily - 7 x weekly - 1 sets - 1-3 reps - 30 seconds hold    Do each exercise 1-2  times per day Do each exercise 5-10 repetitions Hold each exercise for 2 seconds to  feel your location  AT [place].  Try to find this position when standing still for activities.   USE TAPE ON FLOOR TO MARK THE MIDLINE POSITION which is even with middle of sink.  You also should try to feel with your limb pressure in socket.  You are trying to feel with limb what you used to feel with the bottom of your foot.  Side to Side Shift: Moving your hips only (not shoulders): move weight onto your left leg, HOLD/FEEL pressure in socket.  Move back to equal weight on each leg, HOLD/FEEL pressure in socket. Move weight onto your right leg, HOLD/FEEL pressure in socket. Move back to equal weight on each leg, HOLD/FEEL pressure in socket. Repeat.  Start with both hands on sink, then right hand only, then left hand only Front to Back Shift: Moving your hips only (not shoulders): move your weight forward onto your toes, HOLD/FEEL pressure in socket. Move your weight back to equal Flat Foot on both legs, HOLD/FEEL  pressure in socket. Move your weight back onto your heels, HOLD/FEEL  pressure in socket. Move your weight back to equal on both legs, HOLD/FEEL  pressure in socket. Repeat.  Start with both hands on sink, then right hand only, then left hand only  Moving Cones / Cups: With equal weight on each leg: Hold on with one hand the first time, then progress to no hand supports. Move cups from one side of sink to the other. Place cups ~2" out of your reach, progress to 10" beyond reach.  Place one hand in middle of sink and reach with other hand. Do both arms.  Then hover one hand and move cups with other hand.  Overhead/Upward Reaching: alternated reaching up to top cabinets or ceiling if no cabinets present. Keep equal weight on each leg. Start with one hand support on counter while other hand reaches and progress to no hand support with reaching.  ace one hand in middle of sink and reach with other hand. Do both arms.  Looking Over Shoulders: With equal weight on each leg: alternate turning to look over your shoulders with one hand support on counter as needed.  Start with head motions only to look in front of shoulder, then even with shoulder and progress to looking behind you. To look to side, move head /eyes, then shoulder on side looking pulls back, shift more weight to side looking and pull hip back. Place one hand in middle of sink and let go with other hand so your shoulder can pull back. Switch hands to look other way. If looking right, use left hand at sink. If looking left, use right hand at sink. 6.  Stepping with leg tapping forefoot into lower cabinet:  Move items under cabinet out of your way. Shift your hips/pelvis so weight on right side. Tighten muscles in hip on right side.  SLOWLY step left leg so front of foot is in cabinet. Then step back to floor.  Repeat with other leg    ASSESSMENT:   CLINICAL IMPRESSION: Patient & wife appear to understand how to negotiate steps with single rail by side stepping.  Pt continues to be limited by residual limb pain with weight bearing on socket.    OBJECTIVE IMPAIRMENTS Abnormal gait, decreased activity tolerance, decreased balance, decreased coordination, decreased endurance, decreased knowledge of use of DME, decreased mobility, difficulty walking, decreased ROM, decreased strength, decreased safety awareness, increased edema, impaired flexibility, postural dysfunction, prosthetic dependency , and pain.    ACTIVITY LIMITATIONS bending, sitting, standing, squatting, stairs, transfers, bathing, dressing, hygiene/grooming, locomotion level, and prosthetic use   PARTICIPATION LIMITATIONS: cleaning, laundry, and community activity   PERSONAL FACTORS Fitness, Time since onset of injury/illness/exacerbation, and 3+ comorbidities: see PMH  are also affecting patient's functional outcome.    REHAB POTENTIAL: Good   CLINICAL DECISION MAKING: Evolving/moderate  complexity   EVALUATION COMPLEXITY: Moderate     GOALS: Goals reviewed with patient? Yes  UPDATED SHORT TERM GOALS: Target date: 01/11/22  Patient independent with donning/doffing prosthesis. Baseline: see objective data Goal status: MET 01/07/2022  Patient able to transfer sit to/from stand chair without armrests with RW with supervision. Baseline: see objective data Goal status: MET 01/07/2022  Patient ambulates 100' with rollator & prosthesis / AFO with supervision including ramp and curb. Baseline: see objective data Goal status: MET 01/07/2022  LONG TERM GOALS: Target date: 02/07/22   Patient wears prosthesis > 90% of waking hours with no limb pain or skin issues to promote function and safety. Baseline: see objective data Goal status: Ongoing 01/15/2022   2.  Patient independent with all aspects of prosthetic care to enable safe utilization of prosthesis. Baseline: see objective data Goal status: Ongoing 01/15/2022   3.  Patient ambulates 26' with LRAD safely and modified independent to allow for household mobility. Baseline: see objective data Goal status: Ongoing 01/15/2022   4.  Patient able to ambulate 200' and navigate ramp and curb with LRAD safely with minA/ caregiver assist to allow for community mobility. Baseline: see objective data Goal status: Ongoing 01/15/2022   5.  Berg balance score >/= 30/56 with RW to indicate low fall risk during household level balance requirements with UE support. Baseline: see objective data Goal status: Ongoing 01/15/2022     PLAN: PT FREQUENCY: 2x/week   PT DURATION: other: 90 days   PLANNED INTERVENTIONS: Therapeutic exercises, Therapeutic activity, Neuromuscular re-education, Balance training, Gait training, Patient/Family education, Self Care, Stair training, Vestibular training, Orthotic/Fit training, Prosthetic training, DME instructions, Moist heat, scar mobilization,  Manual therapy, Re-evaluation, and physical  performance tests   PLAN FOR NEXT SESSION:  begin to checks LTGs with plan to discharge next week.   Jamey Reas, PT, DPT 01/31/2022, 2:35 PM

## 2022-02-04 ENCOUNTER — Ambulatory Visit (INDEPENDENT_AMBULATORY_CARE_PROVIDER_SITE_OTHER): Payer: Medicare Other | Admitting: Orthopedic Surgery

## 2022-02-04 DIAGNOSIS — S88112A Complete traumatic amputation at level between knee and ankle, left lower leg, initial encounter: Secondary | ICD-10-CM

## 2022-02-04 DIAGNOSIS — I2510 Atherosclerotic heart disease of native coronary artery without angina pectoris: Secondary | ICD-10-CM | POA: Diagnosis not present

## 2022-02-04 DIAGNOSIS — L97411 Non-pressure chronic ulcer of right heel and midfoot limited to breakdown of skin: Secondary | ICD-10-CM

## 2022-02-04 DIAGNOSIS — Z89512 Acquired absence of left leg below knee: Secondary | ICD-10-CM | POA: Diagnosis not present

## 2022-02-05 ENCOUNTER — Ambulatory Visit (INDEPENDENT_AMBULATORY_CARE_PROVIDER_SITE_OTHER): Payer: Medicare Other | Admitting: Physical Therapy

## 2022-02-05 ENCOUNTER — Encounter: Payer: Self-pay | Admitting: Physical Therapy

## 2022-02-05 DIAGNOSIS — M6281 Muscle weakness (generalized): Secondary | ICD-10-CM | POA: Diagnosis not present

## 2022-02-05 DIAGNOSIS — R2681 Unsteadiness on feet: Secondary | ICD-10-CM

## 2022-02-05 DIAGNOSIS — R2689 Other abnormalities of gait and mobility: Secondary | ICD-10-CM

## 2022-02-05 DIAGNOSIS — M79605 Pain in left leg: Secondary | ICD-10-CM

## 2022-02-05 DIAGNOSIS — R293 Abnormal posture: Secondary | ICD-10-CM | POA: Diagnosis not present

## 2022-02-05 NOTE — Therapy (Signed)
OUTPATIENT PHYSICAL THERAPY TREATMENT NOTE    Patient Name: [name] MRN: [id number] DOB:Dec 20, 1950, 71 y.o., male Today's Date: 02/05/2022  PCP: Lorenda Hatchet, FNP REFERRING PROVIDER: [name], MD  END OF SESSION:   PT End of Session - 02/05/22 0850     Visit Number 24    Number of Visits 25    Date for PT Re-Evaluation 02/07/22    Authorization Type Medicare and BCBS    Progress Note Due on Visit 72    PT Start Time 0845    PT Stop Time 0923    PT Time Calculation (min) 38 min    Equipment Utilized During Treatment Gait belt    Activity Tolerance Patient limited by fatigue;Patient limited by pain    Behavior During Therapy [facility] for tasks assessed/performed                      Past Medical History:  Diagnosis Date   Ambulates with cane    straight cane   Anemia    Cervical myelopathy ([place]) 02/06/2018   Chronic kidney disease    dailysis M W F- home   Complication of anesthesia    Coronary artery disease    Diabetes mellitus without complication ([place])    type2   Diabetic foot ulcer ([place])    Diabetic neuropathy ([place]) 02/06/2018   Gait abnormality 08/24/2018   GERD (gastroesophageal reflux disease)     01/06/20- not current   Gout    History of blood transfusion    History of blood transfusion    History of kidney stones    passed stones   Hypercholesteremia    Hypertension    Hypothyroidism    Neuromuscular disorder ([place])    neuropathy left leg and bilateral feet   Neuropathy    PONV (postoperative nausea and vomiting)    Prostate cancer ([place])    PVD (peripheral vascular disease) ([place])    with amputations   Past Surgical History:  Procedure Laterality Date   A-FLUTTER ABLATION N/A 04/06/2020   Procedure: A-FLUTTER ABLATION;  Surgeon: [name], MD;  Location: [place] [facility];  Service: Cardiovascular;  Laterality: N/A;   AMPUTATION Left 12/25/2013   Procedure: AMPUTATION RAY LEFT 5TH RAY;  Surgeon: [name], MD;   Location: [facility];  Service: Orthopedics;  Laterality: Left;   AMPUTATION Right 12/15/2018   Procedure: AMPUTATION OF 4TH AND 5TH TOES RIGHT FOOT;  Surgeon: [name], MD;  Location: [place];  Service: Orthopedics;  Laterality: Right;   AMPUTATION Left 02/02/2021   Procedure: LEFT FOOT 4TH RAY AMPUTATION;  Surgeon: [name], MD;  Location: [place];  Service: Orthopedics;  Laterality: Left;   AMPUTATION Left 05/09/2021   Procedure: LEFT BELOW KNEE AMPUTATION;  Surgeon: [name], MD;  Location: [place];  Service: Orthopedics;  Laterality: Left;   APPLICATION OF WOUND VAC Right 12/15/2018   Procedure: APPLICATION OF WOUND VAC;  Surgeon: [name], MD;  Location: [place];  Service: Orthopedics;  Laterality: Right;   AV FISTULA PLACEMENT Left 03/25/2019   Procedure: LEFT ARM ARTERIOVENOUS (AV) FISTULA CREATION;  Surgeon: [name], MD;  Location: [place];  Service: Vascular;  Laterality: Left;   [place] Left 05/20/2019   Procedure: SECOND STAGE LEFT BASCILIC VEIN TRANSPOSITION;  Surgeon: [name], MD;  Location: [place];  Service: Vascular;  Laterality: Left;   CARDIAC  CATHETERIZATION  02/17/2014   CHOLECYSTECTOMY     COLONOSCOPY  2011   in [place], Normal   CORONARY ARTERY BYPASS GRAFT  2008   CYSTOSCOPY N/A 06/29/2020   Procedure: CYSTOSCOPY WITH CLOT EVACUATION AND  FULGERATION;  Surgeon: [name], MD;  Location: [place];  Service: Urology;  Laterality: N/A;   CYSTOSCOPY WITH FULGERATION Bilateral 06/17/2020   Procedure: CYSTOSCOPY,BILATERAL RETROGRADE, CLOT EVACUATION WITH FULGERATION OF THE BLADDER;  Surgeon: Janith Lima, MD;  Location: [place];  Service: Urology;  Laterality: Bilateral;   CYSTOSCOPY WITH FULGERATION N/A 06/28/2020   Procedure: CYSTOSCOPY WITH CLOT EVACUATION AND FULGERATION OF BLEEDERS;  Surgeon: [name], MD;  Location: [place];  Service: Urology;  Laterality: N/A;  1 HR   I & D EXTREMITY Right  12/15/2018   Procedure: DEBRIDEMENT RIGHT FOOT;  Surgeon: [name], MD;  Location: [place];  Service: Orthopedics;  Laterality: Right;   I & D EXTREMITY Right 08/27/2019   Procedure: PARTIAL CUBOID EXCISION RIGHT FOOT;  Surgeon: [name], MD;  Location: [place];  Service: Orthopedics;  Laterality: Right;   I & D EXTREMITY Right 01/07/2020   Procedure: RIGHT FOOT EXCISION INFECTED BONE;  Surgeon: [name], MD;  Location: [place];  Service: Orthopedics;  Laterality: Right;   NECK SURGERY     novemver 2019   STUMP REVISION Left 06/27/2021   Procedure: REVISION LEFT BELOW KNEE AMPUTATION;  Surgeon: [name], MD;  Location: [place];  Service: Orthopedics;  Laterality: Left;   TRANSURETHRAL RESECTION OF PROSTATE N/A 06/28/2020   Procedure: TRANSURETHRAL RESECTION OF THE PROSTATE (TURP);  Surgeon: [name], MD;  Location: [place];  Service: Urology;  Laterality: N/A;   WISDOM TOOTH EXTRACTION     Patient Active Problem List   Diagnosis Date Noted   Community acquired pneumonia of left lower lobe of lung 08/18/2021   Paroxysmal atrial fibrillation ([place]) 08/18/2021   Anemia of chronic renal failure 08/18/2021   Thrombocytopenia ([place]) 64/15/8309   Acute metabolic encephalopathy 40/76/8088   Action induced myoclonus 08/18/2021   Dehiscence of amputation stump of left lower extremity ([place])    Left below-knee amputee ([place]) 05/11/2021   S/P BKA (below knee amputation) unilateral, left ([place]) 05/09/2021   Partial nontraumatic amputation of foot, left ([place]) 02/20/2021   Abscess of bursa of left foot 02/03/2021   Cutaneous abscess of left foot    Acute osteomyelitis of metatarsal bone of left foot (HCC)    Symptomatic anemia 09/13/2020   COVID-19 virus infection 09/13/2020   Pressure injury of skin 06/29/2020   Gross hematuria 06/16/2020   Typical atrial flutter ([place]) 03/24/2020   Bilateral pleural effusion 02/24/2020   Healthcare maintenance 01/28/2020   Shortness of breath  01/28/2020   History of partial ray amputation of fourth toe of right foot ([place]) 09/08/2019   Ulcerated, foot, right, with necrosis of bone (HCC)    Chronic cough 06/25/2019   Cutaneous abscess of right foot    Subacute osteomyelitis of right foot ([place]) 12/14/2018   AKI (acute kidney injury) ([place]) 12/14/2018   ESRD on hemodialysis ([place]) 12/14/2018   Gait abnormality 08/24/2018   Diabetic neuropathy ([place]) 02/06/2018   Cervical myelopathy ([place]) 02/06/2018   Onychomycosis 10/30/2017   Other spondylosis with radiculopathy, cervical region 01/27/2017   Midfoot ulcer, right, limited to breakdown of skin ([place]) 11/15/2016   Lateral epicondylitis, left elbow 08/12/2016   Cellulitis of fifth toe of right foot 07/29/2016   Prostate cancer (  [place]) 06/19/2016   Non-pressure chronic ulcer of other part of right foot limited to breakdown of skin ([place]) 04/04/2016   Cellulitis of leg, right 12/24/2015   Cellulitis of right lower extremity 12/24/2015   Gout 09/14/2012   Hypertension associated with diabetes ([place]) 09/14/2012   Hypothyroidism 09/14/2012   CAD (coronary artery disease) 09/14/2012   Insulin dependent type 2 diabetes mellitus ([place]) 09/14/2012   Hyperlipidemia associated with type 2 diabetes mellitus ([place])    Neuropathy ([name])     REFERRING DIAG: N27.782U (ICD-10-CM) - Below-knee amputation of left lower extremity  THERAPY DIAG:  Unsteadiness on feet  Other abnormalities of gait and mobility  Abnormal posture  Muscle weakness (generalized)  Pain in left leg  Rationale for Evaluation and Treatment Rehabilitation  PERTINENT HISTORY: braced right foot with cavovarus collapse, CAP without hypoxia, acute metabolic encephalopathy, ESRD on HD Sun/W/F, afib s/p ablation, DM II, neuropathy, HTN, HLD, hypothyroidism, anemia, prostate cancer s/p TUPP. cervical myelopathy, gout, CAD w/CABG 2005  PRECAUTIONS: no BP in LUE, fall risk   SUBJECTIVE:  He is feeling more comfortable going to  friend's houses.    PAIN:  Are you having pain?  Yes seated with prosthesis 0.5-1/10,  standing 1-2/10, walking 2-3/10 increasing with distance, highest since last PT 4-5/10 Location: pain in distal lateral shin  Aggravating factor: wearing & weight bearing on prosthesis increases with time & distance  Alleviating factor: removing prosthesis   OBJECTIVE: (objective measures completed at initial evaluation unless otherwise dated) COGNITION: Overall cognitive status: Within functional limits for tasks assessed              SENSATION: Not tested   POSTURE: rounded shoulders, forward head, and anterior pelvic tilt - RLE resting position with tibial IR, severe foot supination and inversion with cavovarus collapse  PALPATION 12/17/21 Tenderness to palpation at distal lateral tibia, potential bone spur formation at distal lateral tibia   LOWER EXTREMITY ROM:   ROM (A: AROM, P: PROM) Right eval Left eval Left 01/15/22  Hip flexion       Hip extension       Hip abduction       Hip adduction       Hip internal rotation       Hip external rotation       Knee flexion       Knee extension   Standing with RW and prosthesis  A: -16* Seated P: -12* without hamstring tension  Hamstring tightness   Seated P: -19*  Ankle dorsiflexion       Ankle plantarflexion       Ankle inversion       Ankle eversion        (Blank rows = not tested)   LOWER EXTREMITY MMT:   MMT Right eval Left eval  Hip flexion      Hip extension      Hip abduction      Hip adduction      Hip internal rotation      Hip external rotation      Knee flexion      Knee extension      Ankle dorsiflexion      Ankle plantarflexion      Ankle inversion      Ankle eversion      (Blank rows = not tested)   Functional strength with standing and transfers 3- at hip and knee bil., with no motion at Rt ankle   TRANSFERS: Sit to stand: minA -  fell backwards into chair upon first attempt, MinA for next attempt with  RW stabilization and verbal cueing for foot placement Stand to sit: minA for controlling descent and stabilization   GAIT: 02/05/2022:   Pt neg ramp 11* incline with rollator walker with supervision.  Pt neg curb with rollator walker with supervision.  11/13/2021:  Gait pattern:  adduction RLE with toe-in, step to pattern, decreased step length- Right, decreased stance time- Right, decreased stride length, decreased hip/knee flexion- Left, decreased ankle dorsiflexion- Right, Left hip hike, Right foot flat, knee flexed in stance- Right, knee flexed in stance- Left, lateral hip instability, lateral lean- Right, trunk flexed, and poor foot clearance- Right Distance walked: 35' Assistive device utilized: Environmental consultant - 2 wheeled & Left TTA prosthesis, right double upright AFO custom shoe Level of assistance: MinA to recover losses of balance with straight surface walking, ModA to recover losses of balance turning Lt, MaxA with loss of balance during turning Rt Comments: Rt lower leg positioning reduced foot clearance with catching Lt heel most steps, and promoted Rt lateral leaning/lateral loss of balance. Requires excessive BUE support on RW.   FUNCTIONAL TESTs:  02/05/2022:  Merrilee Jansky Balance Scale 9/56, task with RW 45/56  [facility] PT Assessment - 02/05/22 0850       Standardized Balance Assessment   Standardized Balance Assessment Berg Balance Test      Berg Balance Test   Sit to Stand Needs minimal aid to stand or to stabilize   task with RW = 3   Standing Unsupported Unable to stand 30 seconds unassisted   task with RW = 4   Sitting with Back Unsupported but Feet Supported on Floor or Stool Able to sit safely and securely 2 minutes    Stand to Sit Sits independently, has uncontrolled descent   task with RW = 3   Transfers Able to transfer safely, definite need of hands    Standing Unsupported with Eyes Closed Needs help to keep from falling   task with RW = 4   Standing Unsupported with Feet Together  Needs help to attain position and unable to hold for 15 seconds   task with RW = 4   From Standing, Reach Forward with Outstretched Arm Loses balance while trying/requires external support   task with RW = 3   From Standing Position, Pick up Object from Floor Unable to try/needs assist to keep balance   task with RW = 3   From Standing Position, Turn to Look Behind Over each Shoulder Needs assist to keep from losing balance and falling   task with RW = 2   Turn 360 Degrees Needs assistance while turning   task with RW = 2   Standing Unsupported, Alternately Place Feet on Step/Stool Needs assistance to keep from falling or unable to try   task with RW = 4   Standing Unsupported, One Foot in Front Loses balance while stepping or standing   task with RW = 2   Standing on One Leg Unable to try or needs assist to prevent fall   task with RW = 4   Total Score 9    Berg commentMerrilee Jansky Task with RW support 45/56;  on 11/13/2021 was 13/56              11/13/2021:   Merrilee Jansky Balance Scale: 6/56, 13/56 with RW   [facility] PT Assessment - 11/13/21 0001  Standardized Balance Assessment    Standardized Balance Assessment Berg Balance Test          Berg Balance Test    Sit to Stand Needs moderate or maximal assist to stand   Berg task with RW = 1    Standing Unsupported Unable to stand 30 seconds unassisted   Berg task with RW = 2    Sitting with Back Unsupported but Feet Supported on Floor or Stool Able to sit safely and securely 2 minutes     Stand to Sit Needs assistance to sit   Berg task with RW = 2    Transfers Able to transfer with verbal cueing and /or supervision     Standing Unsupported with Eyes Closed Needs help to keep from falling   Berg task with RW = 0    Standing Unsupported with Feet Together Needs help to attain position and unable to hold for 15 seconds   Berg task with RW = 0    From Standing, Reach Forward with Outstretched Arm Loses balance while trying/requires external  support   Berg task with RW = 1 (CGA reaching with Lt)    From Standing Position, Pick up Object from Floor Unable to try/needs assist to keep balance   Berg task with RW = 0    From Standing Position, Turn to Look Behind Over each Shoulder Needs assist to keep from losing balance and falling   Berg task with RW = 0    Turn 360 Degrees Needs assistance while turning   Berg task with RW = 0    Standing Unsupported, Alternately Place Feet on Step/Stool Needs assistance to keep from falling or unable to try   Berg task with RW = 0    Standing Unsupported, One Foot in [employer] balance while stepping or standing   Berg task with RW = 0    Standing on One Leg Unable to try or needs assist to prevent fall   Berg task with RW = 1    Total Score 6     Berg comment: Berg tasks with RW = 13/56                     CURRENT PROSTHETIC WEAR ASSESSMENT: Patient is dependent with: skin check, residual limb care, care of non-amputated limb, prosthetic cleaning, ply sock cleaning, correct ply sock adjustment, proper wear schedule/adjustment, and proper weight-bearing schedule/adjustment Donning prosthesis: Min A and maximal cueing Doffing prosthesis: SBA Prosthetic wear tolerance: 30 minutes 1-2x/day 6 of 6 days since delivery Prosthetic weight bearing tolerance: stood ~5 minutes with c/o tibial crest pain moderate level after weight bearing Edema: present with slow capillary refill Residual limb condition: 1.6 cm scab along incision, cylinderical shape, normal color & temperature, mildly dry skin, slight scar adhesion at distal tibia,  Prosthetic description: silicone liner with pin lock suspension, 2nd pelite foam liner, total contact socket, SACH foot     TODAY'S TREATMENT:  02/05/2022 PT demo & verbal cues on technique for HHA if needed.  Pt & wife verbalized understanding.  See objective data for gait & Berg Balance task with RW PT recommended doing ramp from garage 1x/wk with wife to develop &  maintain skill if he has to do ramp for access in community. Pt & wife verbalized understanding but he is not certain he will do it because ramp increases limb pain.  01/31/2022 Neuromuscular Re-education: Stance LE crossways on beam stepping contralateral LE over & back  beam without adducting (line on floor) 10 reps ea. Standing crossways on foam beam with BUE support cues to minimize weight bearing with head turns right/left, up/down & diagonals with tactile, verbal & visual cues for upright posture between reps. Standing trunk flexion reaching UEs to chair bottom in front (switching lead UE) then reaching to floor switching with which UE then returning to upright.  5 reps leading with ea UE. Tactile & verbal cues.  Prosthetic Training: PT instructed in donning prosthesis seating limb into socket properly / not stomping foot on floor or bouncing in standing.   PT demo & verbal cues on negotiating stairs with single rail (BUE support) side stepping. Pt performed with 3 steps with minA & cues with rail on right & on left.  He had better control & less pain with rail on left (ascending side).    01/28/2022 Discussion with PT, pt & his wife on need for potential for functional progress to justify additional PT beyond plan of care. This is Medicare / insurance requirement.  Neuromuscular Re-education: Pt performed standing with BUE support with red theraband figure 8 around BLEs focus on eccentric return to bilateral stance without adducting feet: alternating LEs stepping abduction, stepping forward & extension 5 reps ea.  Standing on foam wide stance with BUE support //bars: -decreasing WBing UEs - head turns right/left & up/down with midline vision to mirror. -stepping onto foam & tapping contralateral LE to cone anterior - alternating LEs 5 reps ea -stance limb on foam & tapping contralateral LE cone anterior-lateral & 2nd cone in front of stance LE for slight adduction then stepping back to  floor with foot under pelvis (not abd or add) 5 reps ea LE  Reaching to floor - placing one hand at time onto chair bottom then reaching RUE dominant to floor. Return to upright with both hands on chair bottom to //bars one hand at a time.  5 reps with verbal cues & contact assist. PT demo using same technique with locked rollator to pick up item from floor.  Pt able to return demo with verbal cues & contact assist.    HOME EXERCISE PROGRAM:  Access Code: [email or web address] URL: [email or web address] Date: 01/15/2022 Prepared by: Jamey Reas  Exercises - Seated Hamstring Stretch with Strap  - 2-3 x daily - 7 x weekly - 1 sets - 3 reps - 30 seconds hold - Seated Passive Knee Extension with Weight  - 1-3 x daily - 7 x weekly - 1 sets - 1-3 reps - up to 5 minutes hold - Seated Quad Set  - 1 x daily - 7 x weekly - 1 sets - 10 reps - 5 seconds hold - Standing Gastroc Stretch on Step  - 2-3 x daily - 7 x weekly - 1 sets - 1-3 reps - 30 seconds hold    Do each exercise 1-2  times per day Do each exercise 5-10 repetitions Hold each exercise for 2 seconds to feel your location  AT [place].  Try to find this position when standing still for activities.   USE TAPE ON FLOOR TO MARK THE MIDLINE POSITION which is even with middle of sink.  You also should try to feel with your limb pressure in socket.  You are trying to feel with limb what you used to feel with the bottom of your foot.  Side to Side Shift: Moving your hips only (not shoulders): move weight  onto your left leg, HOLD/FEEL pressure in socket.  Move back to equal weight on each leg, HOLD/FEEL pressure in socket. Move weight onto your right leg, HOLD/FEEL pressure in socket. Move back to equal weight on each leg, HOLD/FEEL pressure in socket. Repeat.  Start with both hands on sink, then right hand only, then left hand only Front to Back Shift: Moving your hips  only (not shoulders): move your weight forward onto your toes, HOLD/FEEL pressure in socket. Move your weight back to equal Flat Foot on both legs, HOLD/FEEL  pressure in socket. Move your weight back onto your heels, HOLD/FEEL  pressure in socket. Move your weight back to equal on both legs, HOLD/FEEL  pressure in socket. Repeat.  Start with both hands on sink, then right hand only, then left hand only  Moving Cones / Cups: With equal weight on each leg: Hold on with one hand the first time, then progress to no hand supports. Move cups from one side of sink to the other. Place cups ~2" out of your reach, progress to 10" beyond reach.  Place one hand in middle of sink and reach with other hand. Do both arms.  Then hover one hand and move cups with other hand.  Overhead/Upward Reaching: alternated reaching up to top cabinets or ceiling if no cabinets present. Keep equal weight on each leg. Start with one hand support on counter while other hand reaches and progress to no hand support with reaching.  ace one hand in middle of sink and reach with other hand. Do both arms.   Looking Over Shoulders: With equal weight on each leg: alternate turning to look over your shoulders with one hand support on counter as needed.  Start with head motions only to look in front of shoulder, then even with shoulder and progress to looking behind you. To look to side, move head /eyes, then shoulder on side looking pulls back, shift more weight to side looking and pull hip back. Place one hand in middle of sink and let go with other hand so your shoulder can pull back. Switch hands to look other way. If looking right, use left hand at sink. If looking left, use right hand at sink. 6.  Stepping with leg tapping forefoot into lower cabinet:  Move items under cabinet out of your way. Shift your hips/pelvis so weight on right side. Tighten muscles in hip on right side.  SLOWLY step left leg so front of foot is in cabinet. Then step back  to floor.  Repeat with other leg    ASSESSMENT:   CLINICAL IMPRESSION: Patient improved standing balance with RW or locked rollator support with task of Berg Balance to 45/56 from 13/56.  Pt appears safe negotiating ramp & curb with wife's supervision.    OBJECTIVE IMPAIRMENTS Abnormal gait, decreased activity tolerance, decreased balance, decreased coordination, decreased endurance, decreased knowledge of use of DME, decreased mobility, difficulty walking, decreased ROM, decreased strength, decreased safety awareness, increased edema, impaired flexibility, postural dysfunction, prosthetic dependency , and pain.    ACTIVITY LIMITATIONS bending, sitting, standing, squatting, stairs, transfers, bathing, dressing, hygiene/grooming, locomotion level, and prosthetic use   PARTICIPATION LIMITATIONS: cleaning, laundry, and community activity   PERSONAL FACTORS Fitness, Time since onset of injury/illness/exacerbation, and 3+ comorbidities: see PMH  are also affecting patient's functional outcome.    REHAB POTENTIAL: Good   CLINICAL DECISION MAKING: Evolving/moderate complexity   EVALUATION COMPLEXITY: Moderate     GOALS: Goals reviewed with patient? Yes  UPDATED SHORT TERM GOALS: Target date: 01/11/22  Patient independent with donning/doffing prosthesis. Baseline: see objective data Goal status: MET 01/07/2022  Patient able to transfer sit to/from stand chair without armrests with RW with supervision. Baseline: see objective data Goal status: MET 01/07/2022  Patient ambulates 100' with rollator & prosthesis / AFO with supervision including ramp and curb. Baseline: see objective data Goal status: MET 01/07/2022  LONG TERM GOALS: Target date: 02/07/22   Patient wears prosthesis > 90% of waking hours with no limb pain or skin issues to promote function and safety. Baseline: see objective data Goal status: Ongoing 01/15/2022   2.  Patient independent with all aspects of prosthetic care  to enable safe utilization of prosthesis. Baseline: see objective data Goal status: Ongoing 01/15/2022   3.  Patient ambulates 36' with LRAD safely and modified independent to allow for household mobility. Baseline: see objective data Goal status: Ongoing 01/15/2022   4.  Patient able to ambulate 200' and navigate ramp and curb with LRAD safely with minA/ caregiver assist to allow for community mobility. Baseline: see objective data Goal status: Ongoing 01/15/2022   5.  Berg balance score >/= 30/56 with RW to indicate low fall risk during household level balance requirements with UE support. Baseline: see objective data Goal status: Ongoing 01/15/2022     PLAN: PT FREQUENCY: 2x/week   PT DURATION: other: 90 days   PLANNED INTERVENTIONS: Therapeutic exercises, Therapeutic activity, Neuromuscular re-education, Balance training, Gait training, Patient/Family education, Self Care, Stair training, Vestibular training, Orthotic/Fit training, Prosthetic training, DME instructions, Moist heat, scar mobilization, Manual therapy, Re-evaluation, and physical performance tests   PLAN FOR NEXT SESSION:  checks remaining LTGs & discharge next visit.   Jamey Reas, PT, DPT 02/05/2022, 10:27 AM

## 2022-02-07 ENCOUNTER — Ambulatory Visit (INDEPENDENT_AMBULATORY_CARE_PROVIDER_SITE_OTHER): Payer: Medicare Other | Admitting: Physical Therapy

## 2022-02-07 ENCOUNTER — Encounter: Payer: Self-pay | Admitting: Physical Therapy

## 2022-02-07 DIAGNOSIS — R2681 Unsteadiness on feet: Secondary | ICD-10-CM | POA: Diagnosis not present

## 2022-02-07 DIAGNOSIS — R2689 Other abnormalities of gait and mobility: Secondary | ICD-10-CM | POA: Diagnosis not present

## 2022-02-07 DIAGNOSIS — M6281 Muscle weakness (generalized): Secondary | ICD-10-CM

## 2022-02-07 DIAGNOSIS — M79605 Pain in left leg: Secondary | ICD-10-CM

## 2022-02-07 DIAGNOSIS — R293 Abnormal posture: Secondary | ICD-10-CM

## 2022-02-07 NOTE — Therapy (Signed)
OUTPATIENT PHYSICAL THERAPY TREATMENT NOTE & DISCHARGE SUMMARY   Patient Name: [name] MRN: [id number] DOB:1950/09/27, 71 y.o., male Today's Date: 02/07/2022  PCP: Lorenda Hatchet, FNP REFERRING PROVIDER: [name], MD  PHYSICAL THERAPY DISCHARGE SUMMARY  Visits from Start of Care: 25  Current functional level related to goals / functional outcomes: See below   Remaining deficits: See below   Education / Equipment: Pt was educated in prosthetic care & HEP which he appears to understand.    Patient agrees to discharge. Patient goals were met except wear goal partially met due to some residual limb pain still present. Patient is being discharged due to meeting the stated rehab goals.   END OF SESSION:   PT End of Session - 02/07/22 0938     Visit Number 25    Number of Visits 25    Date for PT Re-Evaluation 02/07/22    Authorization Type Medicare and BCBS    Progress Note Due on Visit 69    PT Start Time 0936    PT Stop Time 1000    PT Time Calculation (min) 24 min    Equipment Utilized During Treatment Gait belt    Activity Tolerance Patient limited by fatigue;Patient limited by pain    Behavior During Therapy WFL for tasks assessed/performed                      Past Medical History:  Diagnosis Date   Ambulates with cane    straight cane   Anemia    Cervical myelopathy ([place]) 02/06/2018   Chronic kidney disease    dailysis M W F- home   Complication of anesthesia    Coronary artery disease    Diabetes mellitus without complication ([place])    type2   Diabetic foot ulcer ([place])    Diabetic neuropathy ([place]) 02/06/2018   Gait abnormality 08/24/2018   GERD (gastroesophageal reflux disease)     01/06/20- not current   Gout    History of blood transfusion    History of blood transfusion    History of kidney stones    passed stones   Hypercholesteremia    Hypertension    Hypothyroidism    Neuromuscular disorder (HCC)    neuropathy left  leg and bilateral feet   Neuropathy    PONV (postoperative nausea and vomiting)    Prostate cancer ([place])    PVD (peripheral vascular disease) ([place])    with amputations   Past Surgical History:  Procedure Laterality Date   A-FLUTTER ABLATION N/A 04/06/2020   Procedure: A-FLUTTER ABLATION;  Surgeon: [name], MD;  Location: [facility];  Service: Cardiovascular;  Laterality: N/A;   AMPUTATION Left 12/25/2013   Procedure: AMPUTATION RAY LEFT 5TH RAY;  Surgeon: [name], MD;  Location: [facility];  Service: Orthopedics;  Laterality: Left;   AMPUTATION Right 12/15/2018   Procedure: AMPUTATION OF 4TH AND 5TH TOES RIGHT FOOT;  Surgeon: [name], MD;  Location: [facility];  Service: Orthopedics;  Laterality: Right;   AMPUTATION Left 02/02/2021   Procedure: LEFT FOOT 4TH RAY AMPUTATION;  Surgeon: [name], MD;  Location: [facility];  Service: Orthopedics;  Laterality: Left;   AMPUTATION Left 05/09/2021   Procedure: LEFT BELOW KNEE AMPUTATION;  Surgeon: [name], MD;  Location: [facility];  Service: Orthopedics;  Laterality: Left;   APPLICATION OF WOUND VAC Right 12/15/2018   Procedure: APPLICATION OF WOUND VAC;  Surgeon: [name],  Illene Regulus, MD;  Location: [facility];  Service: Orthopedics;  Laterality: Right;   AV FISTULA PLACEMENT Left 03/25/2019   Procedure: LEFT ARM ARTERIOVENOUS (AV) FISTULA CREATION;  Surgeon: [name], MD;  Location: [facility];  Service: Vascular;  Laterality: Left;   [place] Left 05/20/2019   Procedure: SECOND STAGE LEFT BASCILIC VEIN TRANSPOSITION;  Surgeon: [name], MD;  Location: [facility];  Service: Vascular;  Laterality: Left;   CARDIAC CATHETERIZATION  02/17/2014   CHOLECYSTECTOMY     COLONOSCOPY  2011   in [place], [place]  2008   CYSTOSCOPY N/A 06/29/2020   Procedure: [place];  Surgeon: [name], MD;  Location: [facility];  Service:  Urology;  Laterality: N/A;   CYSTOSCOPY WITH FULGERATION Bilateral 06/17/2020   Procedure: CYSTOSCOPY,BILATERAL RETROGRADE, CLOT EVACUATION WITH FULGERATION OF THE BLADDER;  Surgeon: Janith Lima, MD;  Location: [facility];  Service: Urology;  Laterality: Bilateral;   CYSTOSCOPY WITH FULGERATION N/A 06/28/2020   Procedure: CYSTOSCOPY WITH CLOT EVACUATION AND FULGERATION OF BLEEDERS;  Surgeon: [name], MD;  Location: [facility];  Service: Urology;  Laterality: N/A;  1 HR   I & D EXTREMITY Right 12/15/2018   Procedure: DEBRIDEMENT RIGHT FOOT;  Surgeon: [name], MD;  Location: [facility];  Service: Orthopedics;  Laterality: Right;   I & D EXTREMITY Right 08/27/2019   Procedure: PARTIAL CUBOID EXCISION RIGHT FOOT;  Surgeon: [name], MD;  Location: [facility];  Service: Orthopedics;  Laterality: Right;   I & D EXTREMITY Right 01/07/2020   Procedure: RIGHT FOOT EXCISION INFECTED BONE;  Surgeon: [name], MD;  Location: [facility];  Service: Orthopedics;  Laterality: Right;   NECK SURGERY     novemver 2019   STUMP REVISION Left 06/27/2021   Procedure: REVISION LEFT BELOW KNEE AMPUTATION;  Surgeon: [name], MD;  Location: [facility];  Service: Orthopedics;  Laterality: Left;   TRANSURETHRAL RESECTION OF PROSTATE N/A 06/28/2020   Procedure: TRANSURETHRAL RESECTION OF THE PROSTATE (TURP);  Surgeon: [name], MD;  Location: [facility];  Service: Urology;  Laterality: N/A;   WISDOM TOOTH EXTRACTION     Patient Active Problem List   Diagnosis Date Noted   Community acquired pneumonia of left lower lobe of lung 08/18/2021   Paroxysmal atrial fibrillation ([place]) 08/18/2021   Anemia of chronic renal failure 08/18/2021   Thrombocytopenia ([place]) 98/33/8250   Acute metabolic encephalopathy 53/97/6734   Action induced myoclonus 08/18/2021   Dehiscence of amputation stump of left lower extremity ([place])    Left below-knee amputee ([place]) 05/11/2021   S/P BKA (below knee amputation) unilateral, left ([place])  05/09/2021   Partial nontraumatic amputation of foot, left ([place]) 02/20/2021   Abscess of bursa of left foot 02/03/2021   Cutaneous abscess of left foot    Acute osteomyelitis of metatarsal bone of left foot (HCC)    Symptomatic anemia 09/13/2020   COVID-19 virus infection 09/13/2020   Pressure injury of skin 06/29/2020   Gross hematuria 06/16/2020   Typical atrial flutter ([place]) 03/24/2020   Bilateral pleural effusion 02/24/2020   Healthcare maintenance 01/28/2020   Shortness of breath 01/28/2020   History of partial ray amputation of fourth toe of right foot ([place]) 09/08/2019   Ulcerated, foot, right, with necrosis of bone (HCC)    Chronic cough 06/25/2019   Cutaneous abscess of right foot  Subacute osteomyelitis of right foot ([place]) 12/14/2018   AKI (acute kidney injury) ([place]) 12/14/2018   ESRD on hemodialysis ([place]) 12/14/2018   Gait abnormality 08/24/2018   Diabetic neuropathy ([place]) 02/06/2018   Cervical myelopathy ([place]) 02/06/2018   Onychomycosis 10/30/2017   Other spondylosis with radiculopathy, cervical region 01/27/2017   Midfoot ulcer, right, limited to breakdown of skin ([place]) 11/15/2016   Lateral epicondylitis, left elbow 08/12/2016   Cellulitis of fifth toe of right foot 07/29/2016   Prostate cancer ([place]) 06/19/2016   Non-pressure chronic ulcer of other part of right foot limited to breakdown of skin ([place]) 04/04/2016   Cellulitis of leg, right 12/24/2015   Cellulitis of right lower extremity 12/24/2015   Gout 09/14/2012   Hypertension associated with diabetes ([place]) 09/14/2012   Hypothyroidism 09/14/2012   CAD (coronary artery disease) 09/14/2012   Insulin dependent type 2 diabetes mellitus ([place]) 09/14/2012   Hyperlipidemia associated with type 2 diabetes mellitus ([place])    Neuropathy ([place])     REFERRING DIAG: N82.956O (ICD-10-CM) - Below-knee amputation of left lower extremity  THERAPY DIAG:  Unsteadiness on feet  Abnormal posture  Other abnormalities of gait  and mobility  Muscle weakness (generalized)  Pain in left leg  Rationale for Evaluation and Treatment Rehabilitation  PERTINENT HISTORY: braced right foot with cavovarus collapse, CAP without hypoxia, acute metabolic encephalopathy, ESRD on HD Sun/W/F, afib s/p ablation, DM II, neuropathy, HTN, HLD, hypothyroidism, anemia, prostate cancer s/p TUPP. cervical myelopathy, gout, CAD w/CABG 2005  PRECAUTIONS: no BP in LUE, fall risk   SUBJECTIVE:  No changes     PAIN:  Are you having pain?  Yes seated with prosthesis  0.5-1/10,  standing 1-2/10, walking 2-3/10 increasing with distance, highest since last PT 4-5/10 Location: pain in distal lateral shin  Aggravating factor: wearing & weight bearing on prosthesis increases with time & distance  Alleviating factor: removing prosthesis   OBJECTIVE: (objective measures completed at initial evaluation unless otherwise dated) COGNITION: Overall cognitive status: Within functional limits for tasks assessed              SENSATION: Not tested   POSTURE: rounded shoulders, forward head, and anterior pelvic tilt - RLE resting position with tibial IR, severe foot supination and inversion with cavovarus collapse  PALPATION 12/17/21 Tenderness to palpation at distal lateral tibia, potential bone spur formation at distal lateral tibia   LOWER EXTREMITY ROM:   ROM (A: AROM, P: PROM) Right eval Left eval Left 01/15/22  Hip flexion       Hip extension       Hip abduction       Hip adduction       Hip internal rotation       Hip external rotation       Knee flexion       Knee extension   Standing with RW and prosthesis  A: -16* Seated P: -12* without hamstring tension  Hamstring tightness   Seated P: -19*  Ankle dorsiflexion       Ankle plantarflexion       Ankle inversion       Ankle eversion        (Blank rows = not tested)   LOWER EXTREMITY MMT:   MMT Right eval Left eval  Hip flexion      Hip extension      Hip abduction       Hip adduction      Hip internal rotation      Hip  external rotation      Knee flexion      Knee extension      Ankle dorsiflexion      Ankle plantarflexion      Ankle inversion      Ankle eversion      (Blank rows = not tested)   Functional strength with standing and transfers 3- at hip and knee bil., with no motion at Rt ankle   TRANSFERS: Sit to stand: minA - fell backwards into chair upon first attempt, MinA for next attempt with RW stabilization and verbal cueing for foot placement Stand to sit: minA for controlling descent and stabilization   GAIT: 02/07/2022: Pt amb 211' with rollator walker, prosthesis & AFO safely modified independent.   02/05/2022:   Pt neg ramp 11* incline with rollator walker with supervision.  Pt neg curb with rollator walker with supervision.  11/13/2021:  Gait pattern:  adduction RLE with toe-in, step to pattern, decreased step length- Right, decreased stance time- Right, decreased stride length, decreased hip/knee flexion- Left, decreased ankle dorsiflexion- Right, Left hip hike, Right foot flat, knee flexed in stance- Right, knee flexed in stance- Left, lateral hip instability, lateral lean- Right, trunk flexed, and poor foot clearance- Right Distance walked: 35' Assistive device utilized: Environmental consultant - 2 wheeled & Left TTA prosthesis, right double upright AFO custom shoe Level of assistance: MinA to recover losses of balance with straight surface walking, ModA to recover losses of balance turning Lt, MaxA with loss of balance during turning Rt Comments: Rt lower leg positioning reduced foot clearance with catching Lt heel most steps, and promoted Rt lateral leaning/lateral loss of balance. Requires excessive BUE support on RW.   FUNCTIONAL TESTs:  02/05/2022:  Merrilee Jansky Balance Scale 9/56, task with RW 45/56  [facility] PT Assessment - 02/05/22 0850                Standardized Balance Assessment    Standardized Balance Assessment Berg Balance Test           Berg Balance Test    Sit to Stand Needs minimal aid to stand or to stabilize   task with RW = 3    Standing Unsupported Unable to stand 30 seconds unassisted   task with RW = 4    Sitting with Back Unsupported but Feet Supported on Floor or Stool Able to sit safely and securely 2 minutes     Stand to Sit Sits independently, has uncontrolled descent   task with RW = 3    Transfers Able to transfer safely, definite need of hands     Standing Unsupported with Eyes Closed Needs help to keep from falling   task with RW = 4    Standing Unsupported with Feet Together Needs help to attain position and unable to hold for 15 seconds   task with RW = 4    From Standing, Reach Forward with Outstretched Arm Loses balance while trying/requires external support   task with RW = 3    From Standing Position, Pick up Object from Floor Unable to try/needs assist to keep balance   task with RW = 3    From Standing Position, Turn to Look Behind Over each Shoulder Needs assist to keep from losing balance and falling   task with RW = 2    Turn 360 Degrees Needs assistance while turning   task with RW = 2    Standing Unsupported, Alternately Place Feet on Step/Stool Needs assistance to keep  from falling or unable to try   task with RW = 4    Standing Unsupported, One Foot in [employer] balance while stepping or standing   task with RW = 2    Standing on One Leg Unable to try or needs assist to prevent fall   task with RW = 4    Total Score 9     Berg comment: Berg Task with RW support 45/56;  on 11/13/2021 was 13/56      11/13/2021:   Merrilee Jansky Balance Scale: 6/56, 13/56 with RW   [facility] PT Assessment - 11/13/21 0001                Standardized Balance Assessment    Standardized Balance Assessment Berg Balance Test          Berg Balance Test    Sit to Stand Needs moderate or maximal assist to stand   Berg task with RW = 1    Standing Unsupported Unable to stand 30 seconds unassisted   Berg task with RW = 2    Sitting  with Back Unsupported but Feet Supported on Floor or Stool Able to sit safely and securely 2 minutes     Stand to Sit Needs assistance to sit   Berg task with RW = 2    Transfers Able to transfer with verbal cueing and /or supervision     Standing Unsupported with Eyes Closed Needs help to keep from falling   Berg task with RW = 0    Standing Unsupported with Feet Together Needs help to attain position and unable to hold for 15 seconds   Berg task with RW = 0    From Standing, Reach Forward with Outstretched Arm Loses balance while trying/requires external support   Berg task with RW = 1 (CGA reaching with Lt)    From Standing Position, Pick up Object from Floor Unable to try/needs assist to keep balance   Berg task with RW = 0    From Standing Position, Turn to Look Behind Over each Shoulder Needs assist to keep from losing balance and falling   Berg task with RW = 0    Turn 360 Degrees Needs assistance while turning   Berg task with RW = 0    Standing Unsupported, Alternately Place Feet on Step/Stool Needs assistance to keep from falling or unable to try   Berg task with RW = 0    Standing Unsupported, One Foot in [employer] balance while stepping or standing   Berg task with RW = 0    Standing on One Leg Unable to try or needs assist to prevent fall   Berg task with RW = 1    Total Score 6     Berg comment: Berg tasks with RW = 13/56                     CURRENT PROSTHETIC WEAR ASSESSMENT: 02/07/2022: Patient is independent with: skin check, residual limb care, care of non-amputated limb, prosthetic cleaning, ply sock cleaning, correct ply sock adjustment, proper wear schedule/adjustment, and proper weight-bearing schedule/adjustment Pt reports wearing prosthesis most of awake hours. He has no openings or skin issues noted on left limb.  He does have some superficial skin issues on anterior left lower leg. See subjective for pain report.    11/13/2021: Patient is dependent with: skin  check, residual limb care, care of non-amputated limb, prosthetic cleaning, ply sock cleaning, correct ply  sock adjustment, proper wear schedule/adjustment, and proper weight-bearing schedule/adjustment Donning prosthesis: Min A and maximal cueing Doffing prosthesis: SBA Prosthetic wear tolerance: 30 minutes 1-2x/day 6 of 6 days since delivery Prosthetic weight bearing tolerance: stood ~5 minutes with c/o tibial crest pain moderate level after weight bearing Edema: present with slow capillary refill Residual limb condition: 1.6 cm scab along incision, cylinderical shape, normal color & temperature, mildly dry skin, slight scar adhesion at distal tibia,  Prosthetic description: silicone liner with pin lock suspension, 2nd pelite foam liner, total contact socket, SACH foot     TODAY'S TREATMENT:  02/07/2022 See objective data for gait & prosthesis use/care.   02/05/2022 PT demo & verbal cues on technique for HHA if needed.  Pt & wife verbalized understanding.  See objective data for gait & Berg Balance task with RW PT recommended doing ramp from garage 1x/wk with wife to develop & maintain skill if he has to do ramp for access in community. Pt & wife verbalized understanding but he is not certain he will do it because ramp increases limb pain.  01/31/2022 Neuromuscular Re-education: Stance LE crossways on beam stepping contralateral LE over & back beam without adducting (line on floor) 10 reps ea. Standing crossways on foam beam with BUE support cues to minimize weight bearing with head turns right/left, up/down & diagonals with tactile, verbal & visual cues for upright posture between reps. Standing trunk flexion reaching UEs to chair bottom in front (switching lead UE) then reaching to floor switching with which UE then returning to upright.  5 reps leading with ea UE. Tactile & verbal cues.  Prosthetic Training: PT instructed in donning prosthesis seating limb into socket properly / not  stomping foot on floor or bouncing in standing.   PT demo & verbal cues on negotiating stairs with single rail (BUE support) side stepping. Pt performed with 3 steps with minA & cues with rail on right & on left.  He had better control & less pain with rail on left (ascending side).    HOME EXERCISE PROGRAM:  Access Code: [email or web address] URL: [email or web address] Date: 01/15/2022 Prepared by: Jamey Reas  Exercises - Seated Hamstring Stretch with Strap  - 2-3 x daily - 7 x weekly - 1 sets - 3 reps - 30 seconds hold - Seated Passive Knee Extension with Weight  - 1-3 x daily - 7 x weekly - 1 sets - 1-3 reps - up to 5 minutes hold - Seated Quad Set  - 1 x daily - 7 x weekly - 1 sets - 10 reps - 5 seconds hold - Standing Gastroc Stretch on Step  - 2-3 x daily - 7 x weekly - 1 sets - 1-3 reps - 30 seconds hold    Do each exercise 1-2  times per day Do each exercise 5-10 repetitions Hold each exercise for 2 seconds to feel your location  AT [place].  Try to find this position when standing still for activities.   USE TAPE ON FLOOR TO MARK THE MIDLINE POSITION which is even with middle of sink.  You also should try to feel with your limb pressure in socket.  You are trying to feel with limb what you used to feel with the bottom of your foot.  Side to Side Shift: Moving your hips only (not shoulders): move weight onto your left leg, HOLD/FEEL pressure in socket.  Move back to equal weight on each  leg, HOLD/FEEL pressure in socket. Move weight onto your right leg, HOLD/FEEL pressure in socket. Move back to equal weight on each leg, HOLD/FEEL pressure in socket. Repeat.  Start with both hands on sink, then right hand only, then left hand only Front to Back Shift: Moving your hips only (not shoulders): move your weight forward onto your toes, HOLD/FEEL pressure in socket. Move your weight back to equal Flat Foot on  both legs, HOLD/FEEL  pressure in socket. Move your weight back onto your heels, HOLD/FEEL  pressure in socket. Move your weight back to equal on both legs, HOLD/FEEL  pressure in socket. Repeat.  Start with both hands on sink, then right hand only, then left hand only  Moving Cones / Cups: With equal weight on each leg: Hold on with one hand the first time, then progress to no hand supports. Move cups from one side of sink to the other. Place cups ~2" out of your reach, progress to 10" beyond reach.  Place one hand in middle of sink and reach with other hand. Do both arms.  Then hover one hand and move cups with other hand.  Overhead/Upward Reaching: alternated reaching up to top cabinets or ceiling if no cabinets present. Keep equal weight on each leg. Start with one hand support on counter while other hand reaches and progress to no hand support with reaching.  ace one hand in middle of sink and reach with other hand. Do both arms.   Looking Over Shoulders: With equal weight on each leg: alternate turning to look over your shoulders with one hand support on counter as needed.  Start with head motions only to look in front of shoulder, then even with shoulder and progress to looking behind you. To look to side, move head /eyes, then shoulder on side looking pulls back, shift more weight to side looking and pull hip back. Place one hand in middle of sink and let go with other hand so your shoulder can pull back. Switch hands to look other way. If looking right, use left hand at sink. If looking left, use right hand at sink. 6.  Stepping with leg tapping forefoot into lower cabinet:  Move items under cabinet out of your way. Shift your hips/pelvis so weight on right side. Tighten muscles in hip on right side.  SLOWLY step left leg so front of foot is in cabinet. Then step back to floor.  Repeat with other leg    ASSESSMENT:   CLINICAL IMPRESSION: Patient met or partially met all LTGs.  He continues to have  some residual limb pain with prosthesis wear but reports improved significantly.  His wife & he report that he is mobile inside the home with walker independently and accesses community with wife's supervision.  Patient improved standing balance with RW or locked rollator support with task of Berg Balance to 45/56 from 13/56.    OBJECTIVE IMPAIRMENTS Abnormal gait, decreased activity tolerance, decreased balance, decreased coordination, decreased endurance, decreased knowledge of use of DME, decreased mobility, difficulty walking, decreased ROM, decreased strength, decreased safety awareness, increased edema, impaired flexibility, postural dysfunction, prosthetic dependency , and pain.    ACTIVITY LIMITATIONS bending, sitting, standing, squatting, stairs, transfers, bathing, dressing, hygiene/grooming, locomotion level, and prosthetic use   PARTICIPATION LIMITATIONS: cleaning, laundry, and community activity   PERSONAL FACTORS Fitness, Time since onset of injury/illness/exacerbation, and 3+ comorbidities: see PMH  are also affecting patient's functional outcome.    REHAB POTENTIAL: Good   CLINICAL DECISION  MAKING: Evolving/moderate complexity   EVALUATION COMPLEXITY: Moderate     GOALS: Goals reviewed with patient? Yes  UPDATED SHORT TERM GOALS: Target date: 01/11/22  Patient independent with donning/doffing prosthesis. Baseline: see objective data Goal status: MET 01/07/2022  Patient able to transfer sit to/from stand chair without armrests with RW with supervision. Baseline: see objective data Goal status: MET 01/07/2022  Patient ambulates 100' with rollator & prosthesis / AFO with supervision including ramp and curb. Baseline: see objective data Goal status: MET 01/07/2022  LONG TERM GOALS: Target date: 02/07/22   Patient wears prosthesis > 90% of waking hours with no limb pain or skin issues to promote function and safety. Baseline: see objective data Goal status: partially MET  02/07/2022   2.  Patient independent with all aspects of prosthetic care to enable safe utilization of prosthesis. Baseline: see objective data Goal status: MET 02/07/2022   3.  Patient ambulates 73' with LRAD safely and modified independent to allow for household mobility. Baseline: see objective data Goal status: MET 02/07/2022   4.  Patient able to ambulate 200' and navigate ramp and curb with LRAD safely with minA/ caregiver assist to allow for community mobility. Baseline: see objective data Goal status: MET 02/07/2022   5.  Berg balance score >/= 30/56 with RW to indicate low fall risk during household level balance requirements with UE support. Baseline: see objective data Goal status: MET 02/05/2022     PLAN: PT FREQUENCY: 2x/week   PT DURATION: other: 90 days   PLANNED INTERVENTIONS: Therapeutic exercises, Therapeutic activity, Neuromuscular re-education, Balance training, Gait training, Patient/Family education, Self Care, Stair training, Vestibular training, Orthotic/Fit training, Prosthetic training, DME instructions, Moist heat, scar mobilization, Manual therapy, Re-evaluation, and physical performance tests   PLAN FOR NEXT SESSION:  discharge PT  Jamey Reas, PT, DPT 02/07/2022, 10:35 AM

## 2022-02-08 ENCOUNTER — Telehealth: Payer: Self-pay | Admitting: Orthopedic Surgery

## 2022-02-08 NOTE — Telephone Encounter (Signed)
Medical records 8/22 - present faxed [facility] to support need for prosthesis [phone]

## 2022-02-19 ENCOUNTER — Encounter: Payer: Self-pay | Admitting: Orthopedic Surgery

## 2022-02-19 NOTE — Progress Notes (Signed)
Office Visit Note   Patient: [name]           Date of Birth: 11/08/1950           MRN: [id number] Visit Date: 02/04/2022              Requested by: Lorenda Hatchet, FNP [id number] [place],  [place] [address] PCP: Lorenda Hatchet, FNP  Chief Complaint  Patient presents with   Left Leg - Follow-up    Hx Left revision BKA 06/27/2021      HPI: Patient is a 71 year old gentleman who presents for 2 separate issues.  #1 revision left transtibial amputation #2 right foot ulceration.  Assessment & Plan: Visit Diagnoses:  1. Below-knee amputation of left lower extremity ([place])   2. Midfoot ulcer, right, limited to breakdown of skin ([place])     Plan: Patient is provided a prescription for a suspension socket on the left he is about 4 months out from surgery and most likely will need his new socket in several months.  Prescription provided for biotech.  Follow-Up Instructions: Return in about 2 months (around 04/06/2022).   Ortho Exam  Patient is alert, oriented, no adenopathy, well-dressed, normal affect, normal respiratory effort. Examination patient shows some pressure areas from his current socket.  No open ulcers no cellulitis.  Patient lacks about 10 degrees to full extension of the left knee causing increased pressure on the anterior tibia.  Examination of the right foot patient has some dermatitis with peeling skin but no open ulcers.  He is currently wearing a double upright brace.  Patient is an existing left transtibial  amputee.  Patient's current comorbidities are not expected to impact the ability to function with the prescribed prosthesis. Patient verbally communicates a strong desire to use a prosthesis. Patient currently requires mobility aids to ambulate without a prosthesis.  Expects not to use mobility aids with a new prosthesis.  Patient is a K2 level ambulator that will use a prosthesis to walk around their home and the community over low  level environmental barriers.      Imaging: No results found. No images are attached to the encounter.  Labs: Lab Results  Component Value Date   HGBA1C 5.5 05/04/2021   HGBA1C 6.3 (H) 02/02/2021   HGBA1C 5.4 09/13/2020   ESRSEDRATE 34 (H) 01/24/2020   ESRSEDRATE 41 (H) 01/10/2020   ESRSEDRATE 55 (H) 12/27/2019   CRP 8.8 (H) 01/24/2020   CRP 35.9 (H) 01/10/2020   CRP 77.9 (H) 12/27/2019   REPTSTATUS 08/23/2021 FINAL 08/18/2021   GRAMSTAIN  06/27/2021    RARE WBC PRESENT, PREDOMINANTLY MONONUCLEAR NO ORGANISMS SEEN    CULT  08/18/2021    NO GROWTH 5 DAYS Performed at [facility], [place] [address], [place], [place] [address]    LABORGA PSEUDOMONAS AERUGINOSA 06/27/2021     Lab Results  Component Value Date   ALBUMIN 4.1 12/20/2021   ALBUMIN 3.3 (L) 08/20/2021   ALBUMIN 3.7 08/19/2021   PREALBUMIN 32.7 05/09/2021   PREALBUMIN 14.8 (L) 12/14/2018    Lab Results  Component Value Date   MG 2.4 05/14/2021   MG 1.9 05/09/2021   MG 2.0 06/28/2020   Lab Results  Component Value Date   VD25OH 42.0 11/19/2021   VD25OH 16.13 (L) 05/09/2021    Lab Results  Component Value Date   PREALBUMIN 32.7 05/09/2021   PREALBUMIN 14.8 (L) 12/14/2018      Latest Ref Rng &  Units 12/20/2021    3:33 PM 08/20/2021    1:44 AM 08/19/2021    6:10 AM  CBC EXTENDED  WBC 4.0 - 10.5 K/uL 4.8  4.7  6.3   RBC 4.22 - 5.81 Mil/uL 3.62  3.18  3.45   Hemoglobin 13.0 - 17.0 g/dL 10.1  9.0  9.7   HCT 39.0 - 52.0 % 30.0  27.8  31.1   Platelets 150.0 - 400.0 K/uL 77.0  82  83   NEUT# 1.4 - 7.7 K/uL 3.9     Lymph# 0.7 - 4.0 K/uL 0.3        There is no height or weight on file to calculate BMI.  Orders:  No orders of the defined types were placed in this encounter.  No orders of the defined types were placed in this encounter.    Procedures: No procedures performed  Clinical Data: No additional findings.  ROS:  All other systems negative, except as noted in the  HPI. Review of Systems  Objective: Vital Signs: There were no vitals taken for this visit.  Specialty Comments:  No specialty comments available.  PMFS History: Patient Active Problem List   Diagnosis Date Noted   Community acquired pneumonia of left lower lobe of lung 08/18/2021   Paroxysmal atrial fibrillation ([place]) 08/18/2021   Anemia of chronic renal failure 08/18/2021   Thrombocytopenia ([place]) 41/93/7902   Acute metabolic encephalopathy 40/97/3532   Action induced myoclonus 08/18/2021   Dehiscence of amputation stump of left lower extremity ([place])    Left below-knee amputee ([place]) 05/11/2021   S/P BKA (below knee amputation) unilateral, left ([place]) 05/09/2021   Partial nontraumatic amputation of foot, left ([place]) 02/20/2021   Abscess of bursa of left foot 02/03/2021   Cutaneous abscess of left foot    Acute osteomyelitis of metatarsal bone of left foot (HCC)    Symptomatic anemia 09/13/2020   COVID-19 virus infection 09/13/2020   Pressure injury of skin 06/29/2020   Gross hematuria 06/16/2020   Typical atrial flutter ([place]) 03/24/2020   Bilateral pleural effusion 02/24/2020   Healthcare maintenance 01/28/2020   Shortness of breath 01/28/2020   History of partial ray amputation of fourth toe of right foot ([place]) 09/08/2019   Ulcerated, foot, right, with necrosis of bone (HCC)    Chronic cough 06/25/2019   Cutaneous abscess of right foot    Subacute osteomyelitis of right foot ([place]) 12/14/2018   AKI (acute kidney injury) ([place]) 12/14/2018   ESRD on hemodialysis ([place]) 12/14/2018   Gait abnormality 08/24/2018   Diabetic neuropathy ([place]) 02/06/2018   Cervical myelopathy ([place]) 02/06/2018   Onychomycosis 10/30/2017   Other spondylosis with radiculopathy, cervical region 01/27/2017   Midfoot ulcer, right, limited to breakdown of skin ([place]) 11/15/2016   Lateral epicondylitis, left elbow 08/12/2016   Cellulitis of fifth toe of right foot 07/29/2016   Prostate cancer ([place])  06/19/2016   Non-pressure chronic ulcer of other part of right foot limited to breakdown of skin ([place]) 04/04/2016   Cellulitis of leg, right 12/24/2015   Cellulitis of right lower extremity 12/24/2015   Gout 09/14/2012   Hypertension associated with diabetes ([place]) 09/14/2012   Hypothyroidism 09/14/2012   CAD (coronary artery disease) 09/14/2012   Insulin dependent type 2 diabetes mellitus ([place]) 09/14/2012   Hyperlipidemia associated with type 2 diabetes mellitus ([place])    Neuropathy ([place])    Past Medical History:  Diagnosis Date   Ambulates with cane    straight cane   Anemia  Cervical myelopathy ([place]) 02/06/2018   Chronic kidney disease    dailysis M W F- home   Complication of anesthesia    Coronary artery disease    Diabetes mellitus without complication ([place])    type2   Diabetic foot ulcer ([place])    Diabetic neuropathy ([place]) 02/06/2018   Gait abnormality 08/24/2018   GERD (gastroesophageal reflux disease)     01/06/20- not current   Gout    History of blood transfusion    History of blood transfusion    History of kidney stones    passed stones   Hypercholesteremia    Hypertension    Hypothyroidism    Neuromuscular disorder (HCC)    neuropathy left leg and bilateral feet   Neuropathy    PONV (postoperative nausea and vomiting)    Prostate cancer (HCC)    PVD (peripheral vascular disease) (HCC)    with amputations    Family History  Problem Relation Age of Onset   Diabetes Mellitus II Mother    Kidney disease Mother    Diabetes Mellitus II Father    CAD Father    Cancer Father        prostate   Kidney disease Father    Diabetes Mellitus II Brother    Kidney disease Brother    Diabetes Mellitus II Brother    Stomach cancer Brother 33   Kidney disease Brother    Colon cancer Neg Hx    Colon polyps Neg Hx    Esophageal cancer Neg Hx    Rectal cancer Neg Hx    Pancreatic cancer Neg Hx     Past Surgical History:  Procedure Laterality Date   A-FLUTTER  ABLATION N/A 04/06/2020   Procedure: A-FLUTTER ABLATION;  Surgeon: [name], MD;  Location: [facility];  Service: Cardiovascular;  Laterality: N/A;   AMPUTATION Left 12/25/2013   Procedure: AMPUTATION RAY LEFT 5TH RAY;  Surgeon: [name], MD;  [facility];  Service: Orthopedics;  Laterality: Left;   AMPUTATION Right 12/15/2018   Procedure: AMPUTATION OF 4TH AND 5TH TOES RIGHT FOOT;  Surgeon: [name], MD;  Location: [facility];  Service: Orthopedics;  Laterality: Right;   AMPUTATION Left 02/02/2021   Procedure: LEFT FOOT 4TH RAY AMPUTATION;  Surgeon: [name], MD;  Location: [facility];  Service: Orthopedics;  Laterality: Left;   AMPUTATION Left 05/09/2021   Procedure: LEFT BELOW KNEE AMPUTATION;  Surgeon: [name], MD;  Location: [facility];  Service: Orthopedics;  Laterality: Left;   APPLICATION OF WOUND VAC Right 12/15/2018   Procedure: APPLICATION OF WOUND VAC;  Surgeon: [name], MD;  Location: [facility];  Service: Orthopedics;  Laterality: Right;   AV FISTULA PLACEMENT Left 03/25/2019   Procedure: LEFT ARM ARTERIOVENOUS (AV) FISTULA CREATION;  Surgeon: [name], MD;  Location: [facility];  Service: Vascular;  Laterality: Left;   [place] Left 05/20/2019   Procedure: SECOND STAGE LEFT BASCILIC VEIN TRANSPOSITION;  Surgeon: [name], MD;  Location: [facility];  Service: Vascular;  Laterality: Left;   CARDIAC CATHETERIZATION  02/17/2014   CHOLECYSTECTOMY     COLONOSCOPY  2011   in [place], [place]  2008   CYSTOSCOPY N/A 06/29/2020   Procedure: [place];  Surgeon: [name], MD;  Location: [facility];  Service: Urology;  Laterality: N/A;   CYSTOSCOPY WITH  FULGERATION Bilateral 06/17/2020   Procedure: CYSTOSCOPY,BILATERAL RETROGRADE, CLOT EVACUATION WITH FULGERATION OF THE BLADDER;  Surgeon: Janith Lima, MD;  Location: [facility];  Service: Urology;   Laterality: Bilateral;   CYSTOSCOPY WITH FULGERATION N/A 06/28/2020   Procedure: CYSTOSCOPY WITH CLOT EVACUATION AND FULGERATION OF BLEEDERS;  Surgeon: [name], MD;  Location: [facility];  Service: Urology;  Laterality: N/A;  1 HR   I & D EXTREMITY Right 12/15/2018   Procedure: DEBRIDEMENT RIGHT FOOT;  Surgeon: [name], MD;  Location: [facility];  Service: Orthopedics;  Laterality: Right;   I & D EXTREMITY Right 08/27/2019   Procedure: PARTIAL CUBOID EXCISION RIGHT FOOT;  Surgeon: [name], MD;  Location: [facility];  Service: Orthopedics;  Laterality: Right;   I & D EXTREMITY Right 01/07/2020   Procedure: RIGHT FOOT EXCISION INFECTED BONE;  Surgeon: [name], MD;  Location: [facility];  Service: Orthopedics;  Laterality: Right;   NECK SURGERY     novemver 2019   STUMP REVISION Left 06/27/2021   Procedure: REVISION LEFT BELOW KNEE AMPUTATION;  Surgeon: [name], MD;  Location: [facility];  Service: Orthopedics;  Laterality: Left;   TRANSURETHRAL RESECTION OF PROSTATE N/A 06/28/2020   Procedure: TRANSURETHRAL RESECTION OF THE PROSTATE (TURP);  Surgeon: [name], MD;  Location: [facility];  Service: Urology;  Laterality: N/A;   WISDOM TOOTH EXTRACTION     Social History   Occupational History   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  Tobacco Use   Smoking status: Former    Types: Cigars    Start date: 04/08/1973    Quit date: 12/24/1988    Years since quitting: 33.1   Smokeless tobacco: Never   Tobacco comments:    Cigars and Pipe   Vaping Use   Vaping Use: Never used  Substance and Sexual Activity   Alcohol use: Never   Drug use: Never   Sexual activity: Yes    Partners: Female

## 2022-02-21 ENCOUNTER — Ambulatory Visit: Payer: BLUE CROSS/BLUE SHIELD | Admitting: Cardiology

## 2022-03-06 ENCOUNTER — Ambulatory Visit (INDEPENDENT_AMBULATORY_CARE_PROVIDER_SITE_OTHER): Payer: Medicare Other | Admitting: Family

## 2022-03-06 ENCOUNTER — Encounter: Payer: Self-pay | Admitting: Family

## 2022-03-06 ENCOUNTER — Telehealth: Payer: Self-pay | Admitting: Orthopedic Surgery

## 2022-03-06 DIAGNOSIS — L97411 Non-pressure chronic ulcer of right heel and midfoot limited to breakdown of skin: Secondary | ICD-10-CM

## 2022-03-06 DIAGNOSIS — I2510 Atherosclerotic heart disease of native coronary artery without angina pectoris: Secondary | ICD-10-CM

## 2022-03-06 DIAGNOSIS — Z89421 Acquired absence of other right toe(s): Secondary | ICD-10-CM | POA: Diagnosis not present

## 2022-03-06 NOTE — Telephone Encounter (Signed)
Patient states he has a sore on his right need someone to call him [phone]

## 2022-03-06 NOTE — Telephone Encounter (Signed)
I can open up a spot

## 2022-03-06 NOTE — Telephone Encounter (Signed)
He will need to come in for evaluation in office for a sore/wound. We can't treat this over the phone. Can you call him back to schedule an appt? Thank you

## 2022-03-06 NOTE — Progress Notes (Signed)
Office Visit Note   Patient: [name]           Date of Birth: 1950/11/20           MRN: [id number] Visit Date: 03/06/2022              Requested by: Lorenda Hatchet, FNP [address] [place],  [place] [address] PCP: Lorenda Hatchet, FNP  Chief Complaint  Patient presents with   Right Foot - Wound Check      HPI: The patient is a 71 year old gentleman who presents today concern for new ulcer along the lateral aspect of his right foot.  He is status post left transtibial amputation today using his prosthesis things are going well with the left lower extremity.  He was last seen in the office for this at the end of October at that point was recommended to resume his regular shoewear he does have a custom orthotic and a extra shoe with a double upright brace on the right.  Developed a blister along the lateral column of the right foot.  Assessment & Plan: Visit Diagnoses: No diagnosis found.  Plan: Discussed ulcer likely coming from pressure from shoe wear.  At this time he will limit his shoewear and weightbearing on the right.  Return to [facility] for modifications to his shoe wear and orthotics.  He does work with Ronalee Belts.  Continue daily dose of cleansing silver alginate dressing changes  Follow-Up Instructions: Return in about 4 weeks (around 04/03/2022).   Ortho Exam  Patient is alert, oriented, no adenopathy, well-dressed, normal affect, normal respiratory effort. On examination of the right foot the ray amputation incision is well-healed there is trace edema he does unfortunately have a new ulcer over the lateral midfoot this is 3 mm in diameter filled in with flat gray tissue.  There is no surrounding maceration no active drainage no erythema no warmth  Imaging: No results found. No images are attached to the encounter.  Labs: Lab Results  Component Value Date   HGBA1C 5.5 05/04/2021   HGBA1C 6.3 (H) 02/02/2021   HGBA1C 5.4 09/13/2020    ESRSEDRATE 34 (H) 01/24/2020   ESRSEDRATE 41 (H) 01/10/2020   ESRSEDRATE 55 (H) 12/27/2019   CRP 8.8 (H) 01/24/2020   CRP 35.9 (H) 01/10/2020   CRP 77.9 (H) 12/27/2019   REPTSTATUS 08/23/2021 FINAL 08/18/2021   GRAMSTAIN  06/27/2021    RARE WBC PRESENT, PREDOMINANTLY MONONUCLEAR NO ORGANISMS SEEN    CULT  08/18/2021    NO GROWTH 5 DAYS Performed at [facility], [place] [address], [place], [place] [address]    LABORGA PSEUDOMONAS AERUGINOSA 06/27/2021     Lab Results  Component Value Date   ALBUMIN 4.1 12/20/2021   ALBUMIN 3.3 (L) 08/20/2021   ALBUMIN 3.7 08/19/2021   PREALBUMIN 32.7 05/09/2021   PREALBUMIN 14.8 (L) 12/14/2018    Lab Results  Component Value Date   MG 2.4 05/14/2021   MG 1.9 05/09/2021   MG 2.0 06/28/2020   Lab Results  Component Value Date   VD25OH 42.0 11/19/2021   VD25OH 16.13 (L) 05/09/2021    Lab Results  Component Value Date   PREALBUMIN 32.7 05/09/2021   PREALBUMIN 14.8 (L) 12/14/2018      Latest Ref Rng & Units 12/20/2021    3:33 PM 08/20/2021    1:44 AM 08/19/2021    6:10 AM  CBC EXTENDED  WBC 4.0 - 10.5 K/uL 4.8  4.7  6.3   RBC 4.22 - 5.81 Mil/uL 3.62  3.18  3.45   Hemoglobin 13.0 - 17.0 g/dL 10.1  9.0  9.7   HCT 39.0 - 52.0 % 30.0  27.8  31.1   Platelets 150.0 - 400.0 K/uL 77.0  82  83   NEUT# 1.4 - 7.7 K/uL 3.9     Lymph# 0.7 - 4.0 K/uL 0.3        There is no height or weight on file to calculate BMI.  Orders:  No orders of the defined types were placed in this encounter.  No orders of the defined types were placed in this encounter.    Procedures: No procedures performed  Clinical Data: No additional findings.  ROS:  All other systems negative, except as noted in the HPI. Review of Systems  Objective: Vital Signs: There were no vitals taken for this visit.  Specialty Comments:  No specialty comments available.  PMFS History: Patient Active Problem List   Diagnosis Date Noted   Community acquired  pneumonia of left lower lobe of lung 08/18/2021   Paroxysmal atrial fibrillation ([place]) 08/18/2021   Anemia of chronic renal failure 08/18/2021   Thrombocytopenia ([place]) 85/63/1497   Acute metabolic encephalopathy 02/63/7858   Action induced myoclonus 08/18/2021   Dehiscence of amputation stump of left lower extremity ([place])    Left below-knee amputee ([place]) 05/11/2021   S/P BKA (below knee amputation) unilateral, left ([place]) 05/09/2021   Partial nontraumatic amputation of foot, left ([place]) 02/20/2021   Abscess of bursa of left foot 02/03/2021   Cutaneous abscess of left foot    Acute osteomyelitis of metatarsal bone of left foot (HCC)    Symptomatic anemia 09/13/2020   COVID-19 virus infection 09/13/2020   Pressure injury of skin 06/29/2020   Gross hematuria 06/16/2020   Typical atrial flutter ([place]) 03/24/2020   Bilateral pleural effusion 02/24/2020   Healthcare maintenance 01/28/2020   Shortness of breath 01/28/2020   History of partial ray amputation of fourth toe of right foot ([place]) 09/08/2019   Ulcerated, foot, right, with necrosis of bone (HCC)    Chronic cough 06/25/2019   Cutaneous abscess of right foot    Subacute osteomyelitis of right foot ([place]) 12/14/2018   AKI (acute kidney injury) ([place]) 12/14/2018   ESRD on hemodialysis ([place]) 12/14/2018   Gait abnormality 08/24/2018   Diabetic neuropathy ([place]) 02/06/2018   Cervical myelopathy ([place]) 02/06/2018   Onychomycosis 10/30/2017   Other spondylosis with radiculopathy, cervical region 01/27/2017   Midfoot ulcer, right, limited to breakdown of skin ([place]) 11/15/2016   Lateral epicondylitis, left elbow 08/12/2016   Cellulitis of fifth toe of right foot 07/29/2016   Prostate cancer ([place]) 06/19/2016   Non-pressure chronic ulcer of other part of right foot limited to breakdown of skin ([place]) 04/04/2016   Cellulitis of leg, right 12/24/2015   Cellulitis of right lower extremity 12/24/2015   Gout 09/14/2012   Hypertension associated with  diabetes ([place]) 09/14/2012   Hypothyroidism 09/14/2012   CAD (coronary artery disease) 09/14/2012   Insulin dependent type 2 diabetes mellitus ([place]) 09/14/2012   Hyperlipidemia associated with type 2 diabetes mellitus ([place])    Neuropathy (HCC)    Past Medical History:  Diagnosis Date   Ambulates with cane    straight cane   Anemia    Cervical myelopathy ([place]) 02/06/2018   Chronic kidney disease    dailysis M W F- home   Complication of anesthesia    Coronary artery disease    Diabetes  mellitus without complication ([place])    type2   Diabetic foot ulcer ([place])    Diabetic neuropathy ([place]) 02/06/2018   Gait abnormality 08/24/2018   GERD (gastroesophageal reflux disease)     01/06/20- not current   Gout    History of blood transfusion    History of blood transfusion    History of kidney stones    passed stones   Hypercholesteremia    Hypertension    Hypothyroidism    Neuromuscular disorder (HCC)    neuropathy left leg and bilateral feet   Neuropathy    PONV (postoperative nausea and vomiting)    Prostate cancer (HCC)    PVD (peripheral vascular disease) (HCC)    with amputations    Family History  Problem Relation Age of Onset   Diabetes Mellitus II Mother    Kidney disease Mother    Diabetes Mellitus II Father    CAD Father    Cancer Father        prostate   Kidney disease Father    Diabetes Mellitus II Brother    Kidney disease Brother    Diabetes Mellitus II Brother    Stomach cancer Brother 18   Kidney disease Brother    Colon cancer Neg Hx    Colon polyps Neg Hx    Esophageal cancer Neg Hx    Rectal cancer Neg Hx    Pancreatic cancer Neg Hx     Past Surgical History:  Procedure Laterality Date   A-FLUTTER ABLATION N/A 04/06/2020   Procedure: A-FLUTTER ABLATION;  Surgeon: [name], MD;  Location: [facility];  Service: Cardiovascular;  Laterality: N/A;   AMPUTATION Left 12/25/2013   Procedure: AMPUTATION RAY LEFT 5TH RAY;  Surgeon: [name], MD;  [facility];  Service: Orthopedics;  Laterality: Left;   AMPUTATION Right 12/15/2018   Procedure: AMPUTATION OF 4TH AND 5TH TOES RIGHT FOOT;  Surgeon: [name], MD;  Location: [facility];  Service: Orthopedics;  Laterality: Right;   AMPUTATION Left 02/02/2021   Procedure: LEFT FOOT 4TH RAY AMPUTATION;  Surgeon: [name], MD;  Location: [facility];  Service: Orthopedics;  Laterality: Left;   AMPUTATION Left 05/09/2021   Procedure: LEFT BELOW KNEE AMPUTATION;  Surgeon: [name], MD;  Location: [facility];  Service: Orthopedics;  Laterality: Left;   APPLICATION OF WOUND VAC Right 12/15/2018   Procedure: APPLICATION OF WOUND VAC;  Surgeon: [name], MD;  Location: [facility];  Service: Orthopedics;  Laterality: Right;   AV FISTULA PLACEMENT Left 03/25/2019   Procedure: LEFT ARM ARTERIOVENOUS (AV) FISTULA CREATION;  Surgeon: [name], MD;  Location: [facility];  Service: Vascular;  Laterality: Left;   [place] Left 05/20/2019   Procedure: SECOND STAGE LEFT BASCILIC VEIN TRANSPOSITION;  Surgeon: [name], MD;  Location: [facility];  Service: Vascular;  Laterality: Left;   CARDIAC CATHETERIZATION  02/17/2014   CHOLECYSTECTOMY     COLONOSCOPY  2011   in [place], [place]  2008   CYSTOSCOPY N/A 06/29/2020   Procedure: [place];  Surgeon: [name], MD;  Location: [facility];  Service: Urology;  Laterality: N/A;   CYSTOSCOPY WITH FULGERATION Bilateral 06/17/2020   Procedure: CYSTOSCOPY,BILATERAL RETROGRADE, CLOT EVACUATION WITH FULGERATION OF THE BLADDER;  Surgeon: Janith Lima, MD;  Location: [facility];  Service: Urology;  Laterality: Bilateral;  CYSTOSCOPY WITH FULGERATION N/A 06/28/2020   Procedure: CYSTOSCOPY WITH CLOT EVACUATION AND FULGERATION OF BLEEDERS;  Surgeon: [name], MD;  Location: [facility];  Service: Urology;  Laterality: N/A;  1 HR   I & D EXTREMITY Right  12/15/2018   Procedure: DEBRIDEMENT RIGHT FOOT;  Surgeon: [name], MD;  Location: [facility];  Service: Orthopedics;  Laterality: Right;   I & D EXTREMITY Right 08/27/2019   Procedure: PARTIAL CUBOID EXCISION RIGHT FOOT;  Surgeon: [name], MD;  Location: [facility];  Service: Orthopedics;  Laterality: Right;   I & D EXTREMITY Right 01/07/2020   Procedure: RIGHT FOOT EXCISION INFECTED BONE;  Surgeon: [name], MD;  Location: [facility];  Service: Orthopedics;  Laterality: Right;   NECK SURGERY     novemver 2019   STUMP REVISION Left 06/27/2021   Procedure: REVISION LEFT BELOW KNEE AMPUTATION;  Surgeon: [name], MD;  Location: [facility];  Service: Orthopedics;  Laterality: Left;   TRANSURETHRAL RESECTION OF PROSTATE N/A 06/28/2020   Procedure: TRANSURETHRAL RESECTION OF THE PROSTATE (TURP);  Surgeon: [name], MD;  Location: [facility];  Service: Urology;  Laterality: N/A;   WISDOM TOOTH EXTRACTION     Social History   Occupational History   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  Tobacco Use   Smoking status: Former    Types: Cigars    Start date: 04/08/1973    Quit date: 12/24/1988    Years since quitting: 33.2   Smokeless tobacco: Never   Tobacco comments:    Cigars and Pipe   Vaping Use   Vaping Use: Never used  Substance and Sexual Activity   Alcohol use: Never   Drug use: Never   Sexual activity: Yes    Partners: Female

## 2022-03-08 ENCOUNTER — Encounter: Payer: Self-pay | Admitting: Family

## 2022-03-11 ENCOUNTER — Encounter
Payer: Medicare Other | Attending: Physical Medicine and Rehabilitation | Admitting: Physical Medicine and Rehabilitation

## 2022-03-11 ENCOUNTER — Encounter: Payer: Self-pay | Admitting: Physical Medicine and Rehabilitation

## 2022-03-11 VITALS — BP 142/60 | HR 58 | Ht 71.0 in | Wt 200.0 lb

## 2022-03-11 DIAGNOSIS — I480 Paroxysmal atrial fibrillation: Secondary | ICD-10-CM | POA: Diagnosis present

## 2022-03-11 DIAGNOSIS — G4700 Insomnia, unspecified: Secondary | ICD-10-CM | POA: Diagnosis present

## 2022-03-11 DIAGNOSIS — Z89512 Acquired absence of left leg below knee: Secondary | ICD-10-CM

## 2022-03-11 DIAGNOSIS — G629 Polyneuropathy, unspecified: Secondary | ICD-10-CM | POA: Diagnosis present

## 2022-03-11 MED ORDER — VITAMIN D (ERGOCALCIFEROL) 1.25 MG (50000 UNIT) PO CAPS: 50000.0000 [IU] | ORAL_CAPSULE | ORAL | 3 refills | Status: DC

## 2022-03-11 MED ORDER — MODAFINIL 200 MG PO TABS: 200.0000 mg | ORAL_TABLET | Freq: Every day | ORAL | 3 refills | Status: DC

## 2022-03-11 NOTE — Progress Notes (Signed)
Subjective:    Patient ID: [name], male    DOB: [birth year], 71 y.o.   MRN: [id number]  HPI [name] is a 71 year old man who presents for f/u of BKA and cognitive deficits, daytime somnolence  1) Left BKA -he is starting rehab tues and thurs at [facility]  -he is working with a Clinical research associate on mon and wed to work on Veterinary surgeon  -he would like to keep to keep up with his physical conditioning -he is considering soft shell with an open back that will not put as much pressure on his shin.  -cannot try the new orthotic.  -he can get it in February   2) Cognitive deficits -he is awake during the day.  -he asks about MRI results -he has been taking fish oil supplement -his wife notes that cognitive deficits have worsened  3) Neuropathy -he has pain in his right heel- it was present prior to his development of diabetes -it is burning -Qutenza did not help  4) Afib:  -not being treated for afib due to his comorbidities  5) Insomnia -intermittent Gets up at 7:30 -wakes at 2,3,4 -wants to put the leg on because then the day starts for him.    Pain Inventory Average Pain 4 Pain Right Now 4 My pain is intermittent, constant, and stabbing  In the last 24 hours, has pain interfered with the following? General activity 5 Relation with others  0 Enjoyment of life 0 What TIME of day is your pain at its worst? morning , daytime, evening, and night Sleep (in general) Good  Pain is worse with: walking, unsure, and some activites Pain improves with: rest and not wearing the prosthesis  Relief from Meds:  not taking pain medication   Family History  Problem Relation Age of Onset   Diabetes Mellitus II Mother    Kidney disease Mother    Diabetes Mellitus II Father    CAD Father    Cancer Father        prostate   Kidney disease Father    Diabetes Mellitus II Brother    Kidney disease Brother    Diabetes Mellitus II Brother    Stomach cancer Brother 43    Kidney disease Brother    Colon cancer Neg Hx    Colon polyps Neg Hx    Esophageal cancer Neg Hx    Rectal cancer Neg Hx    Pancreatic cancer Neg Hx    Social History   Socioeconomic History   Marital status: Married    Spouse name: Not on file   Number of children: 2   Years of education: Not on file   Highest education level: Not on file  Occupational History   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  Tobacco Use   Smoking status: Former    Types: Cigars    Start date: 04/08/1973    Quit date: 12/24/1988    Years since quitting: 33.2   Smokeless tobacco: Never   Tobacco comments:    Cigars and Pipe   Vaping Use   Vaping Use: Never used  Substance and Sexual Activity   Alcohol use: Never   Drug use: Never   Sexual activity: Yes    Partners: Female  Other Topics Concern   Not on file  Social History Narrative   Regular exercise: yes 3 times a week   Caffeine use: hot tea   Social Determinants of Health   Financial Resource Strain: Not  on file  Food Insecurity: No Food Insecurity (11/02/2021)   Hunger Vital Sign    Worried About Running Out of Food in the Last Year: Never true    Ran Out of Food in the Last Year: Never true  Transportation Needs: No Transportation Needs (11/02/2021)   PRAPARE - Hydrologist (Medical): No    Lack of Transportation (Non-Medical): No  Physical Activity: Not on file  Stress: Not on file  Social Connections: Not on file   Past Surgical History:  Procedure Laterality Date   A-FLUTTER ABLATION N/A 04/06/2020   Procedure: A-FLUTTER ABLATION;  Surgeon: [name], MD;  Location: [facility];  Service: Cardiovascular;  Laterality: N/A;   AMPUTATION Left 12/25/2013   Procedure: AMPUTATION RAY LEFT 5TH RAY;  Surgeon: [name], MD;  Location: [facility];  Service: Orthopedics;  Laterality: Left;   AMPUTATION Right 12/15/2018   Procedure: AMPUTATION OF 4TH AND 5TH TOES RIGHT FOOT;  Surgeon: [name],  MD;  Location: [facility];  Service: Orthopedics;  Laterality: Right;   AMPUTATION Left 02/02/2021   Procedure: LEFT FOOT 4TH RAY AMPUTATION;  Surgeon: [name], MD;  Location: [facility];  Service: Orthopedics;  Laterality: Left;   AMPUTATION Left 05/09/2021   Procedure: LEFT BELOW KNEE AMPUTATION;  Surgeon: [name], MD;  Location: [facility];  Service: Orthopedics;  Laterality: Left;   APPLICATION OF WOUND VAC Right 12/15/2018   Procedure: APPLICATION OF WOUND VAC;  Surgeon: [name], MD;  Location: [facility];  Service: Orthopedics;  Laterality: Right;   AV FISTULA PLACEMENT Left 03/25/2019   Procedure: LEFT ARM ARTERIOVENOUS (AV) FISTULA CREATION;  Surgeon: [name], MD;  Location: [facility];  Service: Vascular;  Laterality: Left;   [place] Left 05/20/2019   Procedure: SECOND STAGE LEFT BASCILIC VEIN TRANSPOSITION;  Surgeon: [name], MD;  Location: [facility];  Service: Vascular;  Laterality: Left;   CARDIAC CATHETERIZATION  02/17/2014   CHOLECYSTECTOMY     COLONOSCOPY  2011   in [place], [place]  2008   CYSTOSCOPY N/A 06/29/2020   Procedure: [place];  Surgeon: [name], MD;  Location: [facility];  Service: Urology;  Laterality: N/A;   CYSTOSCOPY WITH FULGERATION Bilateral 06/17/2020   Procedure: CYSTOSCOPY,BILATERAL RETROGRADE, CLOT EVACUATION WITH FULGERATION OF THE BLADDER;  Surgeon: Janith Lima, MD;  Location: [facility];  Service: Urology;  Laterality: Bilateral;   CYSTOSCOPY WITH FULGERATION N/A 06/28/2020   Procedure: CYSTOSCOPY WITH CLOT EVACUATION AND FULGERATION OF BLEEDERS;  Surgeon: [name], MD;  Location: [facility];  Service: Urology;  Laterality: N/A;  1 HR   I & D EXTREMITY Right 12/15/2018   Procedure: DEBRIDEMENT RIGHT FOOT;  Surgeon: [name], MD;  Location: [facility];  Service: Orthopedics;  Laterality: Right;   I & D EXTREMITY Right 08/27/2019    Procedure: PARTIAL CUBOID EXCISION RIGHT FOOT;  Surgeon: [name], MD;  Location: [facility];  Service: Orthopedics;  Laterality: Right;   I & D EXTREMITY Right 01/07/2020   Procedure: RIGHT FOOT EXCISION INFECTED BONE;  Surgeon: [name], MD;  Location: [facility];  Service: Orthopedics;  Laterality: Right;   NECK SURGERY     novemver 2019   STUMP REVISION Left 06/27/2021   Procedure: REVISION LEFT BELOW KNEE AMPUTATION;  Surgeon: [name],  MD;  Location: [facility];  Service: Orthopedics;  Laterality: Left;   TRANSURETHRAL RESECTION OF PROSTATE N/A 06/28/2020   Procedure: TRANSURETHRAL RESECTION OF THE PROSTATE (TURP);  Surgeon: [name], MD;  Location: [facility];  Service: Urology;  Laterality: N/A;   WISDOM TOOTH EXTRACTION     Past Medical History:  Diagnosis Date   Acute osteomyelitis of metatarsal bone of left foot ([place])    Ambulates with cane    straight cane   Anemia    Cervical myelopathy ([place]) 02/06/2018   Chronic kidney disease    dailysis [facility]- home   Complication of anesthesia    Coronary artery disease    Dehiscence of amputation stump of left lower extremity ([place])    Diabetes mellitus without complication ([place])    type2   Diabetic foot ulcer ([place])    Diabetic neuropathy ([place]) 02/06/2018   Gait abnormality 08/24/2018   GERD (gastroesophageal reflux disease)     01/06/20- not current   Gout    History of blood transfusion    History of blood transfusion    History of kidney stones    passed stones   Hypercholesteremia    Hypertension    Hypothyroidism    Neuromuscular disorder (HCC)    neuropathy left leg and bilateral feet   Neuropathy    Partial nontraumatic amputation of foot, left ([place]) 02/20/2021   PONV (postoperative nausea and vomiting)    Prostate cancer (HCC)    PVD (peripheral vascular disease) (HCC)    with amputations   BP (!) 142/60   Pulse (!) 58   Ht '5\' 11"'$  (1.803 m)   Wt 200 lb (90.7 kg)   SpO2 98%   BMI 27.89 kg/m   Opioid  Risk Score:   Fall Risk Score:  `1  Depression screen PHQ 2/9     03/11/2022    1:32 PM 11/02/2021    3:29 PM 09/25/2021   11:00 AM 02/28/2020    3:07 PM 01/10/2020    4:21 PM 06/20/2016    8:18 AM  Depression screen PHQ 2/9  Decreased Interest 0 0 1 0 0 0  Down, Depressed, Hopeless 0 0 3 0 0 0  PHQ - 2 Score 0 0 4 0 0 0  Altered sleeping   3     Tired, decreased energy   3     Change in appetite   3     Feeling bad or failure about yourself    0     Trouble concentrating   3     Moving slowly or fidgety/restless   3     Suicidal thoughts   0     PHQ-9 Score   19       Review of Systems  Constitutional:  Positive for appetite change.       Loss of taste  Musculoskeletal:  Positive for gait problem.  Neurological:  Positive for tremors and numbness.       Spasms  Psychiatric/Behavioral:         Depression  All other systems reviewed and are negative.      Objective:   Physical Exam Gen: no distress, normal appearing HEENT: oral mucosa pink and moist, NCAT Cardio: Reg rate Chest: normal effort, normal rate of breathing Abd: soft, non-distended Ext: no edema Psych: pleasant, normal affect Skin: intact Neuro: Alert and oriented x3 Musculoskeletal: right foot twitches, missing digits on right foot.      Assessment & Plan:  1) L BKA -encouraged appointment with [facility] for fitting of new prosthetic  2) Cognitive deficits -discussed that recommended brand of fish oil was Rosita -continue modafinil as he has experienced great improvements with this --discussed MRI results that show mild atrophy and small vessel changes -continue modafinil   3) Fatigue/daytime somnolence -continue modafinil '200mg'$  daily -continue fish oil supplement -continue coffee -start ergocalciferol 50,000U once per week for 20 weeks.  -continue HD at night.  -encouraged lifting weights   4) Afib -continue fish oil  5) Neuropathy -will not repeat Qutenza since he had burning from this  in the past.

## 2022-03-11 NOTE — Addendum Note (Signed)
Addended by: [name] on: 03/11/2022 02:22 PM   Modules accepted: Orders

## 2022-03-11 NOTE — Patient Instructions (Signed)
Foods that pain:  1) Ginger (especially studied for arthritis)- reduce leukotriene production to decrease inflammation 2) Blueberries- high in phytonutrients that decrease inflammation 3) Salmon- marine omega-3s reduce joint swelling and pain 4) Pumpkin seeds- reduce inflammation 5) dark chocolate- reduces inflammation 6) turmeric- reduces inflammation 7) tart cherries - reduce pain and stiffness 8) extra virgin olive oil - its compound olecanthal helps to block prostaglandins  9) chili peppers- can be eaten or applied topically via capsaicin 10) mint- helpful for headache, muscle aches, joint pain, and itching 11) garlic- reduces inflammation  Link to further information on diet for chronic pain: [email or web address]  

## 2022-03-13 ENCOUNTER — Ambulatory Visit: Payer: Medicare Other | Admitting: Cardiology

## 2022-03-13 ENCOUNTER — Encounter: Payer: Self-pay | Admitting: Cardiology

## 2022-03-13 VITALS — BP 150/59 | HR 60 | Ht 71.0 in | Wt 186.4 lb

## 2022-03-13 DIAGNOSIS — I4821 Permanent atrial fibrillation: Secondary | ICD-10-CM

## 2022-03-13 DIAGNOSIS — N186 End stage renal disease: Secondary | ICD-10-CM

## 2022-03-13 DIAGNOSIS — I10 Essential (primary) hypertension: Secondary | ICD-10-CM

## 2022-03-13 DIAGNOSIS — I2510 Atherosclerotic heart disease of native coronary artery without angina pectoris: Secondary | ICD-10-CM

## 2022-03-13 DIAGNOSIS — Z5309 Procedure and treatment not carried out because of other contraindication: Secondary | ICD-10-CM | POA: Insufficient documentation

## 2022-03-13 MED ORDER — DIALYVITE 800/ZINC 0.8 MG PO TABS: 1.0000 | ORAL_TABLET | Freq: Every day | ORAL | 3 refills | Status: DC

## 2022-03-13 NOTE — Progress Notes (Signed)
Primary Physician/Referring:  Lorenda Hatchet, FNP  Patient ID: [name], male    DOB: Jul 01, 1950, 71 y.o.   MRN: [id number]  Chief Complaint  Patient presents with   Coronary Artery Disease   Follow-up    6 months     HPI:    [name]  is a 71 y.o.  Caucasian male with CABG in [place] 02/18/2004: RIMA to LAD, LIMA to obtuse marginal, SVG to diagonal and SVG to PDA, history of atrial flutter ablation on 04/06/2020 but noted to have atrial fibrillation in July 2022, inability to tolerate anticoagulation due to severe hematuria and unable to tolerate antiplatelet agents due to GI bleed, diabetes mellitus, diabetic nephropathy with ESRD on HD at home, peripheral neuropathy and diabetic foot ulcer right, S/P Sim's amputation of the right foot and underwent left BKA in February 2023.  Since amputation, his health is significantly improved, he is now able to walk with the help of a prosthesis, he is walking around in the house without limitations, no fall, he is now being dialyzed at home at night giving him good quality of life during the daytime.  In fact he has returned to partially work in his [employer].  He has no chest pain, no dyspnea, no PND or orthopnea.  Past Medical History:  Diagnosis Date   Acute osteomyelitis of metatarsal bone of left foot (HCC)    Ambulates with cane    straight cane   Anemia    Cervical myelopathy (HCC) 02/06/2018   Chronic kidney disease    dailysis M W F- home   Complication of anesthesia    Coronary artery disease    Dehiscence of amputation stump of left lower extremity ([place])    Diabetes mellitus without complication ([place])    type2   Diabetic foot ulcer ([place])    Diabetic neuropathy ([place]) 02/06/2018   Gait abnormality 08/24/2018   GERD (gastroesophageal reflux disease)     01/06/20- not current   Gout    History of blood transfusion    History of blood transfusion    History of kidney stones    passed stones   Hypercholesteremia     Hypertension    Hypothyroidism    Neuromuscular disorder (HCC)    neuropathy left leg and bilateral feet   Neuropathy    Partial nontraumatic amputation of foot, left ([place]) 02/20/2021   PONV (postoperative nausea and vomiting)    Prostate cancer (HCC)    PVD (peripheral vascular disease) ([place])    with amputations    Social History   Tobacco Use   Smoking status: Former    Types: Cigars    Start date: 04/08/1973    Quit date: 12/24/1988    Years since quitting: 33.2   Smokeless tobacco: Never   Tobacco comments:    Cigars and Pipe   Substance Use Topics   Alcohol use: Never    ROS  Review of Systems  Cardiovascular:  Negative for chest pain, dyspnea on exertion and leg swelling.  Respiratory:  Positive for cough (chronic).    Objective  Blood pressure (!) 150/59, pulse 60, height '5\' 11"'$  (1.803 m), weight 186 lb 6.4 oz (84.6 kg), SpO2 97 %.     03/13/2022    1:42 PM 03/11/2022    1:27 PM 01/08/2022   11:04 AM  Vitals with BMI  Height '5\' 11"'$  '5\' 11"'$  '5\' 11"'$   Weight 186 lbs 6 oz 200 lbs 197 lbs 5 oz  BMI  26.01 75.44 92.01  Systolic 007 121 975  Diastolic 59 60 61  Pulse 60 58 75     Physical Exam Constitutional:      Comments: Ill looking  Neck:     Vascular: Carotid bruit (bilateral carotid bruit) present. No JVD.  Cardiovascular:     Rate and Rhythm: Normal rate. Rhythm irregular.     Pulses:          Posterior tibial pulses are 1+ on the right side.     Heart sounds: S1 normal and S2 normal. Murmur heard.     Early systolic murmur is present with a grade of 2/6 at the upper right sternal border.     Comments: Left BKA. Small open wound noted at the stump. Thick rigid nails and onychomycosis (chronic).  Capillary refill < 3 Sec.  Left arm AV fistula for dialysis patent. Pulmonary:     Effort: Pulmonary effort is normal.     Breath sounds: Normal breath sounds.  Abdominal:     General: Bowel sounds are normal.     Palpations: Abdomen is soft.  Musculoskeletal:         General: Deformity (Left KKA) present.     Right lower leg: No edema.  Skin:    Capillary Refill: Capillary refill takes less than 2 seconds.    Laboratory examination:   Recent Labs    08/18/21 2052 08/19/21 0610 08/20/21 0144 12/20/21 1533  NA 135 135 138 135  K 3.9 3.8 3.7 3.7  CL 93* 93* 99 94*  CO2 '30 24 23 30  '$ GLUCOSE 128* 123* 110* 95  BUN 46* 51* 64* 39*  CREATININE 6.95* 7.65* 8.73* 5.89*  CALCIUM 9.8 9.2 8.9 9.3  GFRNONAA 8* 7* 6*  --    CrCl cannot be calculated (Patient's most recent lab result is older than the maximum 21 days allowed.).     Latest Ref Rng & Units 12/20/2021    3:33 PM 08/20/2021    1:44 AM 08/19/2021    6:10 AM  CMP  Glucose 70 - 99 mg/dL 95  110  123   BUN 6 - 23 mg/dL 39  64  51   Creatinine 0.40 - 1.50 mg/dL 5.89  8.73  7.65   Sodium 135 - 145 mEq/L 135  138  135   Potassium 3.5 - 5.1 mEq/L 3.7  3.7  3.8   Chloride 96 - 112 mEq/L 94  99  93   CO2 19 - 32 mEq/L '30  23  24   '$ Calcium 8.4 - 10.5 mg/dL 9.3  8.9  9.2   Total Protein 6.0 - 8.3 g/dL 6.2   6.0   Total Bilirubin 0.2 - 1.2 mg/dL 1.3   1.7   Alkaline Phos 39 - 117 U/L 135   69   AST 0 - 37 U/L 12   12   ALT 0 - 53 U/L 11   13       Latest Ref Rng & Units 12/20/2021    3:33 PM 08/20/2021    1:44 AM 08/19/2021    6:10 AM  CBC  WBC 4.0 - 10.5 K/uL 4.8  4.7  6.3   Hemoglobin 13.0 - 17.0 g/dL 10.1  9.0  9.7   Hematocrit 39.0 - 52.0 % 30.0  27.8  31.1   Platelets 150.0 - 400.0 K/uL 77.0  82  83    HEMOGLOBIN A1C Lab Results  Component Value Date   HGBA1C 5.5 05/04/2021  MPG 111.15 05/04/2021     Medications and allergies   Allergies  Allergen Reactions   Morphine     Other reaction(s): Delusions (intolerance)   Mushroom Extract Complex Nausea Only    Current Outpatient Medications:    acetaminophen (TYLENOL) 500 MG tablet, Take 1,000 mg by mouth every 8 (eight) hours as needed for mild pain or headache., Disp: , Rfl:    allopurinol (ZYLOPRIM) 100 MG tablet,  Take 2 tablets (200 mg total) by mouth daily., Disp: 30 tablet, Rfl: 0   carvedilol (COREG) 12.5 MG tablet, TAKE 1 TABLET BY MOUTH IN THE [name] AND AT BEDTIME., Disp: 180 tablet, Rfl: 3   Darbepoetin Alfa (ARANESP) 100 MCG/0.5ML SOSY injection, Inject 0.5 mLs (100 mcg total) into the vein every Thursday with hemodialysis., Disp: 4.2 mL, Rfl:    famotidine-calcium carbonate-magnesium hydroxide (PEPCID COMPLETE) 10-800-165 MG chewable tablet, Chew 1 tablet by mouth daily as needed (heartburn)., Disp: , Rfl:    Insulin Pen Needle 32G X 4 MM MISC, Use to inject insulin up to 4 times daily as needed., Disp: 100 each, Rfl: 0   levothyroxine (SYNTHROID) 112 MCG tablet, Take 1 tablet (112 mcg total) by mouth daily before breakfast., Disp: 30 tablet, Rfl: 0   losartan (COZAAR) 50 MG tablet, Take 50 mg by mouth 2 (two) times daily., Disp: , Rfl:    methocarbamol (ROBAXIN) 500 MG tablet, Take 1 tablet (500 mg total) by mouth 3 (three) times daily. (Patient taking differently: Take 500 mg by mouth every 8 (eight) hours as needed for muscle spasms.), Disp: 30 tablet, Rfl: 0   Methoxy PEG-Epoetin Beta (MIRCERA IJ), Inject into the skin., Disp: , Rfl:    modafinil (PROVIGIL) 200 MG tablet, Take 1 tablet (200 mg total) by mouth daily., Disp: 90 tablet, Rfl: 3   OZEMPIC, 0.25 OR 0.5 MG/DOSE, 2 MG/3ML SOPN, Inject 0.5 mg into the skin once a week., Disp: , Rfl:    pantoprazole (PROTONIX) 40 MG tablet, Take 1 tablet (40 mg total) by mouth daily. (Patient taking differently: Take 40 mg by mouth every evening.), Disp: 30 tablet, Rfl: 0   Polyethyl Glycol-Propyl Glycol (LUBRICANT EYE DROPS) 0.4-0.3 % SOLN, Place 1-2 drops into both eyes in the [name] and at bedtime., Disp: , Rfl:    pregabalin (LYRICA) 150 MG capsule, Take 1 capsule by mouth in the [name] and 2 capsules at night., Disp: 90 capsule, Rfl: 0   prochlorperazine (COMPAZINE) 5 MG tablet, TAKE 1 TABLET BY MOUTH TWICE A DAY, Disp: 60 tablet, Rfl: 3    rosuvastatin (CRESTOR) 10 MG tablet, Take 1 tablet (10 mg total) by mouth daily., Disp: 30 tablet, Rfl: 0   Vitamin D, Ergocalciferol, (DRISDOL) 1.25 MG (50000 UNIT) CAPS capsule, Take 1 capsule (50,000 Units total) by mouth every 7 (seven) days., Disp: 20 capsule, Rfl: 3   B Complex-C-Zn-Folic Acid (DIALYVITE 606 WITH ZINC) 0.8 MG TABS, Take 1 tablet by mouth daily., Disp: 90 tablet, Rfl: 3   sevelamer carbonate (RENVELA) 800 MG tablet, Take 1 tablet (800 mg total) by mouth 3 (three) times daily with meals., Disp: 90 tablet, Rfl: 0    Radiology:   CXR 02/25/2020: Persistent small bilateral pleural effusions, slightly increased on the left.  Cardiac Studies:   Lower extremity arterial duplex 05/12/2014:  No hemodynamically significant stenoses are identified in the bilateral lower extremity arterial system.This exam reveals normal perfusion of both the lower extremities with RABI 1.04 and LABI 1.09. There is mild diffuse disease involving  the small vessels below the knee. Compared to the study done on 06/08/2013, no significant change. Impression: Lower extremity venous insufficiency study 12/25/2016: No venous insufficiency or DVT in the right lower extremity. Reflexes in the left greater saphenous vein beginning in the distal thigh and involving the entire length of the calf. No obvious varicosities.  ABI 12/16/2018: Right: Resting right ankle-brachial index is within normal range. No evidence of significant right lower extremity arterial disease. Left: Resting left ankle-brachial index indicates noncompressible left lower extremity arteries.  Carotid artery duplex  05/07/2019: Stenosis in the bilateral internal carotid artery (16-49%), lower end of spectrum with heteregenous plaque.  Antegrade right vertebral artery flow. Antegrade left vertebral artery flow. Follow up in one year is appropriate if clinically indicated. Compared to 01/27/2018, no significant change.  Echocardiogram  03/17/2020: Mildly depressed LV systolic function with visual EF 40-45%. Left ventricle cavity is normal in size. Moderate concentric hypertrophy of the left ventricle. Left ventricle regional wall motion findings: Mid inferoseptal, Apical septal and Apical cap hypokinesis. Unable to evaluate diastolic function due to atrial fibrillation. Elevated LAP.  Left atrial cavity is severely dilated. Aortic valve sclerosis without stenosis. Mild (Grade I) mitral regurgitation. Mild tricuspid regurgitation. No evidence of pulmonary hypertension. RVSP measures 33 mmHg. Mild pulmonic regurgitation. IVC is dilated with blunted respiratory response. Compared to prior study dated 06/08/2013: LVEF was 60-65% and now 40-45%, RWMA is new, LA is now severely dilated.   Lexiscan Tetrofosmin Stress Test 03/20/2020: Non-diagnostic ECG stress due to pharmacologic stress testing. Resting EKG demonstrated atrial flutter. Non-specific Twave abnormalities. Peak EKG revealed no significant ST-T change from baseline abnormality. Myocardial perfusion is normal. Mildly enlarged left ventricle. in both rest and stress. LV stress volume 170 ml. TID is normal. No stress lung uptake. Overall LV systolic function is low normal without regional wall motion  abnormalities. Stress LV EF: 51%. Compared to 05/28/2013, no significant change. LV dilatation is new.  EKG  EKG 07/24/2021: Atrial fibrillation with controlled ventricular sponsor at rate of 60 bpm, normal axis, incomplete right bundle branch block.  Nonspecific T abnormality.  No significant change from 05/04/2021.  Assessment     ICD-10-CM   1. Coronary artery disease involving native coronary artery of native heart without angina pectoris  I25.10 EKG 12-Lead    2. Essential hypertension  I10     3. Permanent atrial fibrillation (HCC)  I48.21     4. ESRD (end stage renal disease) (HCC)  N18.6 B Complex-C-Zn-Folic Acid (DIALYVITE 161 WITH ZINC) 0.8 MG TABS    5.  Contraindication to anticoagulation therapy GU Bleed  Z53.09      CHA2DS2-VASc Score is 4.  Yearly risk of stroke: 4.8% (A, HTN, DM, Vasc Dz).  Score of 1=0.6; 2=2.2; 3=3.2; 4=4.8; 5=7.2; 6=9.8; 7=>9.8) -(CHF; HTN; vasc disease DM,  Male = 1; Age <65 =0; 65-74 = 1,  >75 =2; stroke/embolism= 2).    Meds ordered this encounter  Medications   B Complex-C-Zn-Folic Acid (DIALYVITE 096 WITH ZINC) 0.8 MG TABS    Sig: Take 1 tablet by mouth daily.    Dispense:  90 tablet    Refill:  3     Medications Discontinued During This Encounter  Medication Reason   calcitRIOL (ROCALTROL) 0.5 MCG capsule Patient Preference   losartan (COZAAR) 100 MG tablet Discontinued by provider   Semaglutide,0.25 or 0.'5MG'$ /DOS, (OZEMPIC, 0.25 OR 0.5 MG/DOSE,) 2 MG/1.5ML SOPN Discontinued by provider   B Complex-C-Zn-Folic Acid (DIALYVITE 045 WITH ZINC) 0.8 MG TABS  Reorder      Recommendations:   [name]  is a 71 y.o. Caucasian male with CABG in [place] 02/18/2004: RIMA to LAD, LIMA to obtuse marginal, SVG to diagonal and SVG to PDA, history of atrial flutter ablation on 04/06/2020 but noted to have atrial fibrillation in July 2022, inability to tolerate anticoagulation due to severe hematuria and unable to tolerate antiplatelet agents due to GI bleed, diabetes mellitus, diabetic nephropathy with ESRD on HD at home, peripheral neuropathy and diabetic foot ulcer right, S/P Sim's amputation of the right foot and underwent left BKA in February 2023.  1. Coronary artery disease involving native coronary artery of native heart without angina pectoris Patient remains asymptomatic without chest pain or dyspnea.  He is feeling the best he has in quite a while since his left lower extremity amputation the septic features he had, petechiae it has resolved.  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  2. Essential hypertension Blood pressure is well-controlled now, he is taking losartan  50 mg twice daily.  3. Permanent atrial fibrillation ([place]) He is rate controlled.  Does not want to be on anticoagulation due to bleeding diathesis in the past.  He also has renal disease that is making him have high risk for bleeding complications as well.  He has had severe hematuria in the past and he is concerned about restarting medication.  4. ESRD (end stage renal disease) ([place]) He had ran out of multivitamin tablets, I will refill the prescriptions. He performs home dialysis, accompanied by his wife.  It was a pleasure to see him having recovered so well. I will see him back in 6 months for follow-up.   Adrian Prows, MD, [facility] 03/13/2022, 2:39 PM Office: [phone] Pager: [phone]

## 2022-03-14 ENCOUNTER — Ambulatory Visit: Payer: BLUE CROSS/BLUE SHIELD | Admitting: Cardiology

## 2022-03-25 ENCOUNTER — Ambulatory Visit (INDEPENDENT_AMBULATORY_CARE_PROVIDER_SITE_OTHER): Payer: Medicare Other | Admitting: Orthopedic Surgery

## 2022-03-25 DIAGNOSIS — L97411 Non-pressure chronic ulcer of right heel and midfoot limited to breakdown of skin: Secondary | ICD-10-CM | POA: Diagnosis not present

## 2022-03-25 DIAGNOSIS — I2510 Atherosclerotic heart disease of native coronary artery without angina pectoris: Secondary | ICD-10-CM

## 2022-03-25 DIAGNOSIS — Z89512 Acquired absence of left leg below knee: Secondary | ICD-10-CM | POA: Diagnosis not present

## 2022-03-25 DIAGNOSIS — S88112A Complete traumatic amputation at level between knee and ankle, left lower leg, initial encounter: Secondary | ICD-10-CM

## 2022-03-26 ENCOUNTER — Encounter: Payer: Self-pay | Admitting: Orthopedic Surgery

## 2022-03-26 NOTE — Progress Notes (Signed)
Office Visit Note   Patient: [name]           Date of Birth: [birth year]           MRN: [id number] Visit Date: 03/25/2022              Requested by: Lorenda Hatchet, FNP [id number] [place],  [place] [address] PCP: Lorenda Hatchet, FNP  Chief Complaint  Patient presents with   Left Leg - Follow-up    Hx BKA   Right Foot - Wound Check      HPI: Patient is a 71 year old gentleman who presents in follow-up for Wagner grade 1 ulcer right foot and a left transtibial amputation.  Patient states he started having increasing pain in the left lower extremity.  Assessment & Plan: Visit Diagnoses:  1. Midfoot ulcer, right, limited to breakdown of skin ([place])   2. Below-knee amputation of left lower extremity ([place])     Plan: Recommended patient use his stump shrinker at all times.  He will use the stump shrinker under his silicone liner.  Follow-Up Instructions: Return in about 4 weeks (around 04/22/2022).   Ortho Exam  Patient is alert, oriented, no adenopathy, well-dressed, normal affect, normal respiratory effort. Examination patient has an ulcer over the lateral right foot with supination and varus deformity.  He has a lateral ulcer that is 10 mm in diameter is healthy granulation tissue this is superficial with no exposed bone or tendon no tunneling no drainage.  Examination of his left lower extremity he has some areas of excoriation.  Patient has had a recent fall and has swelling in the residual limb  Imaging: No results found. No images are attached to the encounter.  Labs: Lab Results  Component Value Date   HGBA1C 5.5 05/04/2021   HGBA1C 6.3 (H) 02/02/2021   HGBA1C 5.4 09/13/2020   ESRSEDRATE 34 (H) 01/24/2020   ESRSEDRATE 41 (H) 01/10/2020   ESRSEDRATE 55 (H) 12/27/2019   CRP 8.8 (H) 01/24/2020   CRP 35.9 (H) 01/10/2020   CRP 77.9 (H) 12/27/2019   REPTSTATUS 08/23/2021 FINAL 08/18/2021   GRAMSTAIN  06/27/2021    RARE WBC PRESENT,  PREDOMINANTLY MONONUCLEAR NO ORGANISMS SEEN    CULT  08/18/2021    NO GROWTH 5 DAYS Performed at [facility], [place] [address], [place], [place] [address]    LABORGA PSEUDOMONAS AERUGINOSA 06/27/2021     Lab Results  Component Value Date   ALBUMIN 4.1 12/20/2021   ALBUMIN 3.3 (L) 08/20/2021   ALBUMIN 3.7 08/19/2021   PREALBUMIN 32.7 05/09/2021   PREALBUMIN 14.8 (L) 12/14/2018    Lab Results  Component Value Date   MG 2.4 05/14/2021   MG 1.9 05/09/2021   MG 2.0 06/28/2020   Lab Results  Component Value Date   VD25OH 42.0 11/19/2021   VD25OH 16.13 (L) 05/09/2021    Lab Results  Component Value Date   PREALBUMIN 32.7 05/09/2021   PREALBUMIN 14.8 (L) 12/14/2018      Latest Ref Rng & Units 12/20/2021    3:33 PM 08/20/2021    1:44 AM 08/19/2021    6:10 AM  CBC EXTENDED  WBC 4.0 - 10.5 K/uL 4.8  4.7  6.3   RBC 4.22 - 5.81 Mil/uL 3.62  3.18  3.45   Hemoglobin 13.0 - 17.0 g/dL 10.1  9.0  9.7   HCT 39.0 - 52.0 % 30.0  27.8  31.1   Platelets 150.0 - 400.0  K/uL 77.0  82  83   NEUT# 1.4 - 7.7 K/uL 3.9     Lymph# 0.7 - 4.0 K/uL 0.3        There is no height or weight on file to calculate BMI.  Orders:  No orders of the defined types were placed in this encounter.  No orders of the defined types were placed in this encounter.    Procedures: No procedures performed  Clinical Data: No additional findings.  ROS:  All other systems negative, except as noted in the HPI. Review of Systems  Objective: Vital Signs: There were no vitals taken for this visit.  Specialty Comments:  No specialty comments available.  PMFS History: Patient Active Problem List   Diagnosis Date Noted   Contraindication to anticoagulation therapy GU Bleed 03/13/2022   Community acquired pneumonia of left lower lobe of lung 08/18/2021   Paroxysmal atrial fibrillation ([place]) 08/18/2021   Anemia of chronic renal failure 08/18/2021   Thrombocytopenia ([place]) 16/57/9038   Acute  metabolic encephalopathy 33/38/3291   Action induced myoclonus 08/18/2021   Left below-knee amputee ([place]) 05/11/2021   Symptomatic anemia 09/13/2020   COVID-19 virus infection 09/13/2020   Pressure injury of skin 06/29/2020   Gross hematuria 06/16/2020   Typical atrial flutter ([place]) 03/24/2020   Bilateral pleural effusion 02/24/2020   Healthcare maintenance 01/28/2020   Shortness of breath 01/28/2020   History of partial ray amputation of fourth toe of right foot ([place]) 09/08/2019   Chronic cough 06/25/2019   AKI (acute kidney injury) ([place]) 12/14/2018   ESRD on hemodialysis ([place]) 12/14/2018   Gait abnormality 08/24/2018   Diabetic neuropathy ([place]) 02/06/2018   Cervical myelopathy ([place]) 02/06/2018   Onychomycosis 10/30/2017   Other spondylosis with radiculopathy, cervical region 01/27/2017   Midfoot ulcer, right, limited to breakdown of skin ([place]) 11/15/2016   Lateral epicondylitis, left elbow 08/12/2016   Prostate cancer ([place]) 06/19/2016   Gout 09/14/2012   Hypertension associated with diabetes ([place]) 09/14/2012   Hypothyroidism 09/14/2012   CAD (coronary artery disease) 09/14/2012   Insulin dependent type 2 diabetes mellitus ([place]) 09/14/2012   Hyperlipidemia associated with type 2 diabetes mellitus ([place])    Neuropathy (HCC)    Past Medical History:  Diagnosis Date   Acute osteomyelitis of metatarsal bone of left foot ([place])    Ambulates with cane    straight cane   Anemia    Cervical myelopathy ([place]) 02/06/2018   Chronic kidney disease    dailysis M W F- home   Complication of anesthesia    Coronary artery disease    Dehiscence of amputation stump of left lower extremity ([place])    Diabetes mellitus without complication ([place])    type2   Diabetic foot ulcer ([place])    Diabetic neuropathy ([place]) 02/06/2018   Gait abnormality 08/24/2018   GERD (gastroesophageal reflux disease)     01/06/20- not current   Gout    History of blood transfusion    History of blood transfusion     History of kidney stones    passed stones   Hypercholesteremia    Hypertension    Hypothyroidism    Neuromuscular disorder ([place])    neuropathy left leg and bilateral feet   Neuropathy    Partial nontraumatic amputation of foot, left ([place]) 02/20/2021   PONV (postoperative nausea and vomiting)    Prostate cancer ([place])    PVD (peripheral vascular disease) ([place])    with amputations    Family History  Problem Relation Age  of Onset   Diabetes Mellitus II Mother    Kidney disease Mother    Diabetes Mellitus II Father    CAD Father    Cancer Father        prostate   Kidney disease Father    Diabetes Mellitus II Brother    Kidney disease Brother    Diabetes Mellitus II Brother    Stomach cancer Brother 50   Kidney disease Brother    Colon cancer Neg Hx    Colon polyps Neg Hx    Esophageal cancer Neg Hx    Rectal cancer Neg Hx    Pancreatic cancer Neg Hx     Past Surgical History:  Procedure Laterality Date   A-FLUTTER ABLATION N/A 04/06/2020   Procedure: A-FLUTTER ABLATION;  Surgeon: [name], MD;  Location: [facility];  Service: Cardiovascular;  Laterality: N/A;   AMPUTATION Left 12/25/2013   Procedure: AMPUTATION RAY LEFT 5TH RAY;  Surgeon: [name], MD;  [facility];  Service: Orthopedics;  Laterality: Left;   AMPUTATION Right 12/15/2018   Procedure: AMPUTATION OF 4TH AND 5TH TOES RIGHT FOOT;  Surgeon: [name], MD;  Location: [facility];  Service: Orthopedics;  Laterality: Right;   AMPUTATION Left 02/02/2021   Procedure: LEFT FOOT 4TH RAY AMPUTATION;  Surgeon: [name], MD;  Location: [facility];  Service: Orthopedics;  Laterality: Left;   AMPUTATION Left 05/09/2021   Procedure: LEFT BELOW KNEE AMPUTATION;  Surgeon: [name], MD;  Location: [facility];  Service: Orthopedics;  Laterality: Left;   APPLICATION OF WOUND VAC Right 12/15/2018   Procedure: APPLICATION OF WOUND VAC;  Surgeon: [name], MD;  Location: [facility];  Service: Orthopedics;   Laterality: Right;   AV FISTULA PLACEMENT Left 03/25/2019   Procedure: LEFT ARM ARTERIOVENOUS (AV) FISTULA CREATION;  Surgeon: [name], MD;  Location: [facility];  Service: Vascular;  Laterality: Left;   [place] Left 05/20/2019   Procedure: SECOND STAGE LEFT BASCILIC VEIN TRANSPOSITION;  Surgeon: [name], MD;  Location: [facility];  Service: Vascular;  Laterality: Left;   CARDIAC CATHETERIZATION  02/17/2014   CHOLECYSTECTOMY     COLONOSCOPY  2011   in [place], [place]  2008   CYSTOSCOPY N/A 06/29/2020   Procedure: [place];  Surgeon: [name], MD;  Location: [facility];  Service: Urology;  Laterality: N/A;   CYSTOSCOPY WITH FULGERATION Bilateral 06/17/2020   Procedure: CYSTOSCOPY,BILATERAL RETROGRADE, CLOT EVACUATION WITH FULGERATION OF THE BLADDER;  Surgeon: Janith Lima, MD;  Location: [facility];  Service: Urology;  Laterality: Bilateral;   CYSTOSCOPY WITH FULGERATION N/A 06/28/2020   Procedure: CYSTOSCOPY WITH CLOT EVACUATION AND FULGERATION OF BLEEDERS;  Surgeon: [name], MD;  Location: [facility];  Service: Urology;  Laterality: N/A;  1 HR   I & D EXTREMITY Right 12/15/2018   Procedure: DEBRIDEMENT RIGHT FOOT;  Surgeon: [name], MD;  Location: [facility];  Service: Orthopedics;  Laterality: Right;   I & D EXTREMITY Right 08/27/2019   Procedure: PARTIAL CUBOID EXCISION RIGHT FOOT;  Surgeon: [name], MD;  Location: [facility];  Service: Orthopedics;  Laterality: Right;   I & D EXTREMITY Right 01/07/2020   Procedure: RIGHT FOOT EXCISION INFECTED BONE;  Surgeon: [name], MD;  Location: [facility];  Service: Orthopedics;  Laterality: Right;   NECK SURGERY  novemver 2019   STUMP REVISION Left 06/27/2021   Procedure: REVISION LEFT BELOW KNEE AMPUTATION;  Surgeon: [name], MD;  Location: [facility];  Service: Orthopedics;  Laterality: Left;   TRANSURETHRAL  RESECTION OF PROSTATE N/A 06/28/2020   Procedure: TRANSURETHRAL RESECTION OF THE PROSTATE (TURP);  Surgeon: [name], MD;  Location: [facility];  Service: Urology;  Laterality: N/A;   WISDOM TOOTH EXTRACTION     Social History   Occupational History   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  Tobacco Use   Smoking status: Former    Types: Cigars    Start date: 04/08/1973    Quit date: 12/24/1988    Years since quitting: 33.2   Smokeless tobacco: Never   Tobacco comments:    Cigars and Pipe   Vaping Use   Vaping Use: Never used  Substance and Sexual Activity   Alcohol use: Never   Drug use: Never   Sexual activity: Yes    Partners: Female

## 2022-04-29 ENCOUNTER — Ambulatory Visit: Payer: Medicare Other | Admitting: Orthopedic Surgery

## 2022-05-09 ENCOUNTER — Ambulatory Visit (INDEPENDENT_AMBULATORY_CARE_PROVIDER_SITE_OTHER): Payer: Medicare Other | Admitting: Orthopedic Surgery

## 2022-05-09 DIAGNOSIS — L97411 Non-pressure chronic ulcer of right heel and midfoot limited to breakdown of skin: Secondary | ICD-10-CM | POA: Diagnosis not present

## 2022-05-09 DIAGNOSIS — Z89512 Acquired absence of left leg below knee: Secondary | ICD-10-CM | POA: Diagnosis not present

## 2022-05-09 DIAGNOSIS — S88112A Complete traumatic amputation at level between knee and ankle, left lower leg, initial encounter: Secondary | ICD-10-CM

## 2022-05-10 ENCOUNTER — Encounter: Payer: Self-pay | Admitting: Orthopedic Surgery

## 2022-05-10 NOTE — Progress Notes (Signed)
Office Visit Note   Patient: [name]           Date of Birth: [birth year]           MRN: [id number] Visit Date: 05/09/2022              Requested by: Lorenda Hatchet, [place] [address] [place],  [place] [address] PCP: Lorenda Hatchet, FNP  Chief Complaint  Patient presents with   Left Leg - Follow-up    06/27/2021 revision left BKA       HPI: Patient is a 72 year old gentleman who is seen in follow-up for left transtibial amputation and cavovarus collapse of the right lower extremity with a lateral ulcer currently using extra-depth shoes and double upright braces.  Patient is currently received his socket list socket 2 days ago.  Assessment & Plan: Visit Diagnoses:  1. Midfoot ulcer, right, limited to breakdown of skin ([place])   2. Below-knee amputation of left lower extremity ([place])     Plan: Patient is pleased with the fit of his new socket on the left.  A felt donut is in place to unload pressure from the lateral foot ulcer.  Follow-Up Instructions: Return in about 4 weeks (around 06/06/2022).   Ortho Exam  Patient is alert, oriented, no adenopathy, well-dressed, normal affect, normal respiratory effort. Examination patient has a fixed varus deformity of the right foot status post lateral column amputation.  He has an ulcer over the base of the cuboid.  The Wagner grade 1 ulcer is 1 cm in diameter there is no exposed bone or tendon no cellulitis no drainage.  Patient has no ulcers in the left transtibial amputation.  Imaging: No results found. No images are attached to the encounter.  Labs: Lab Results  Component Value Date   HGBA1C 5.5 05/04/2021   HGBA1C 6.3 (H) 02/02/2021   HGBA1C 5.4 09/13/2020   ESRSEDRATE 34 (H) 01/24/2020   ESRSEDRATE 41 (H) 01/10/2020   ESRSEDRATE 55 (H) 12/27/2019   CRP 8.8 (H) 01/24/2020   CRP 35.9 (H) 01/10/2020   CRP 77.9 (H) 12/27/2019   REPTSTATUS 08/23/2021 FINAL 08/18/2021   GRAMSTAIN  06/27/2021    RARE WBC  PRESENT, PREDOMINANTLY MONONUCLEAR NO ORGANISMS SEEN    CULT  08/18/2021    NO GROWTH 5 DAYS Performed at [facility], [place] [address] [place], [place] [address]    LABORGA PSEUDOMONAS AERUGINOSA 06/27/2021     Lab Results  Component Value Date   ALBUMIN 4.1 12/20/2021   ALBUMIN 3.3 (L) 08/20/2021   ALBUMIN 3.7 08/19/2021   PREALBUMIN 32.7 05/09/2021   PREALBUMIN 14.8 (L) 12/14/2018    Lab Results  Component Value Date   MG 2.4 05/14/2021   MG 1.9 05/09/2021   MG 2.0 06/28/2020   Lab Results  Component Value Date   VD25OH 42.0 11/19/2021   VD25OH 16.13 (L) 05/09/2021    Lab Results  Component Value Date   PREALBUMIN 32.7 05/09/2021   PREALBUMIN 14.8 (L) 12/14/2018      Latest Ref Rng & Units 12/20/2021    3:33 PM 08/20/2021    1:44 AM 08/19/2021    6:10 AM  CBC EXTENDED  WBC 4.0 - 10.5 K/uL 4.8  4.7  6.3   RBC 4.22 - 5.81 Mil/uL 3.62  3.18  3.45   Hemoglobin 13.0 - 17.0 g/dL 10.1  9.0  9.7   HCT 39.0 - 52.0 % 30.0  27.8  31.1  Platelets 150.0 - 400.0 K/uL 77.0  82  83   NEUT# 1.4 - 7.7 K/uL 3.9     Lymph# 0.7 - 4.0 K/uL 0.3        There is no height or weight on file to calculate BMI.  Orders:  No orders of the defined types were placed in this encounter.  No orders of the defined types were placed in this encounter.    Procedures: No procedures performed  Clinical Data: No additional findings.  ROS:  All other systems negative, except as noted in the HPI. Review of Systems  Objective: Vital Signs: There were no vitals taken for this visit.  Specialty Comments:  No specialty comments available.  PMFS History: Patient Active Problem List   Diagnosis Date Noted   Contraindication to anticoagulation therapy GU Bleed 03/13/2022   Community acquired pneumonia of left lower lobe of lung 08/18/2021   Paroxysmal atrial fibrillation ([place]) 08/18/2021   Anemia of chronic renal failure 08/18/2021   Thrombocytopenia ([place]) 98/33/8250    Acute metabolic encephalopathy 53/97/6734   Action induced myoclonus 08/18/2021   Left below-knee amputee ([place]) 05/11/2021   Symptomatic anemia 09/13/2020   COVID-19 virus infection 09/13/2020   Pressure injury of skin 06/29/2020   Gross hematuria 06/16/2020   Typical atrial flutter ([place]) 03/24/2020   Bilateral pleural effusion 02/24/2020   Healthcare maintenance 01/28/2020   Shortness of breath 01/28/2020   History of partial ray amputation of fourth toe of right foot ([place]) 09/08/2019   Chronic cough 06/25/2019   AKI (acute kidney injury) ([place]) 12/14/2018   ESRD on hemodialysis ([place]) 12/14/2018   Gait abnormality 08/24/2018   Diabetic neuropathy ([place]) 02/06/2018   Cervical myelopathy ([place]) 02/06/2018   Onychomycosis 10/30/2017   Other spondylosis with radiculopathy, cervical region 01/27/2017   Midfoot ulcer, right, limited to breakdown of skin ([place]) 11/15/2016   Lateral epicondylitis, left elbow 08/12/2016   Prostate cancer ([place]) 06/19/2016   Gout 09/14/2012   Hypertension associated with diabetes ([place]) 09/14/2012   Hypothyroidism 09/14/2012   CAD (coronary artery disease) 09/14/2012   Insulin dependent type 2 diabetes mellitus ([place]) 09/14/2012   Hyperlipidemia associated with type 2 diabetes mellitus ([place])    Neuropathy (HCC)    Past Medical History:  Diagnosis Date   Acute osteomyelitis of metatarsal bone of left foot ([place])    Ambulates with cane    straight cane   Anemia    Cervical myelopathy ([place]) 02/06/2018   Chronic kidney disease    dailysis M W F- home   Complication of anesthesia    Coronary artery disease    Dehiscence of amputation stump of left lower extremity ([place])    Diabetes mellitus without complication ([place])    type2   Diabetic foot ulcer ([place])    Diabetic neuropathy ([place]) 02/06/2018   Gait abnormality 08/24/2018   GERD (gastroesophageal reflux disease)     01/06/20- not current   Gout    History of blood transfusion    History of blood  transfusion    History of kidney stones    passed stones   Hypercholesteremia    Hypertension    Hypothyroidism    Neuromuscular disorder ([place])    neuropathy left leg and bilateral feet   Neuropathy    Partial nontraumatic amputation of foot, left ([place]) 02/20/2021   PONV (postoperative nausea and vomiting)    Prostate cancer ([place])    PVD (peripheral vascular disease) ([place])    with amputations    Family History  Problem Relation Age of Onset   Diabetes Mellitus II Mother    Kidney disease Mother    Diabetes Mellitus II Father    CAD Father    Cancer Father        prostate   Kidney disease Father    Diabetes Mellitus II Brother    Kidney disease Brother    Diabetes Mellitus II Brother    Stomach cancer Brother 32   Kidney disease Brother    Colon cancer Neg Hx    Colon polyps Neg Hx    Esophageal cancer Neg Hx    Rectal cancer Neg Hx    Pancreatic cancer Neg Hx     Past Surgical History:  Procedure Laterality Date   A-FLUTTER ABLATION N/A 04/06/2020   Procedure: A-FLUTTER ABLATION;  Surgeon: [name], MD;  Location: [facility];  Service: Cardiovascular;  Laterality: N/A;   AMPUTATION Left 12/25/2013   Procedure: AMPUTATION RAY LEFT 5TH RAY;  Surgeon: [name], MD;  [facility];  Service: Orthopedics;  Laterality: Left;   AMPUTATION Right 12/15/2018   Procedure: AMPUTATION OF 4TH AND 5TH TOES RIGHT FOOT;  Surgeon: [name], MD;  Location: [facility];  Service: Orthopedics;  Laterality: Right;   AMPUTATION Left 02/02/2021   Procedure: LEFT FOOT 4TH RAY AMPUTATION;  Surgeon: [name], MD;  Location: [facility];  Service: Orthopedics;  Laterality: Left;   AMPUTATION Left 05/09/2021   Procedure: LEFT BELOW KNEE AMPUTATION;  Surgeon: [name], MD;  Location: [facility];  Service: Orthopedics;  Laterality: Left;   APPLICATION OF WOUND VAC Right 12/15/2018   Procedure: APPLICATION OF WOUND VAC;  Surgeon: [name], MD;  Location: [facility];  Service:  Orthopedics;  Laterality: Right;   AV FISTULA PLACEMENT Left 03/25/2019   Procedure: LEFT ARM ARTERIOVENOUS (AV) FISTULA CREATION;  Surgeon: [name], MD;  Location: [facility];  Service: Vascular;  Laterality: Left;   [place] Left 05/20/2019   Procedure: SECOND STAGE LEFT BASCILIC VEIN TRANSPOSITION;  Surgeon: [name], MD;  Location: [facility];  Service: Vascular;  Laterality: Left;   CARDIAC CATHETERIZATION  02/17/2014   CHOLECYSTECTOMY     COLONOSCOPY  2011   in [place], [place]  2008   CYSTOSCOPY N/A 06/29/2020   Procedure: [place];  Surgeon: [name], MD;  Location: [facility];  Service: Urology;  Laterality: N/A;   CYSTOSCOPY WITH FULGERATION Bilateral 06/17/2020   Procedure: CYSTOSCOPY,BILATERAL RETROGRADE, CLOT EVACUATION WITH FULGERATION OF THE BLADDER;  Surgeon: Janith Lima, MD;  Location: [facility];  Service: Urology;  Laterality: Bilateral;   CYSTOSCOPY WITH FULGERATION N/A 06/28/2020   Procedure: CYSTOSCOPY WITH CLOT EVACUATION AND FULGERATION OF BLEEDERS;  Surgeon: [name], MD;  Location: [facility];  Service: Urology;  Laterality: N/A;  1 HR   I & D EXTREMITY Right 12/15/2018   Procedure: DEBRIDEMENT RIGHT FOOT;  Surgeon: [name], MD;  Location: [facility];  Service: Orthopedics;  Laterality: Right;   I & D EXTREMITY Right 08/27/2019   Procedure: PARTIAL CUBOID EXCISION RIGHT FOOT;  Surgeon: [name], MD;  Location: [facility];  Service: Orthopedics;  Laterality: Right;   I & D EXTREMITY Right 01/07/2020   Procedure: RIGHT FOOT EXCISION INFECTED BONE;  Surgeon: [name], MD;  Location: [facility];  Service: Orthopedics;  Laterality: Right;  NECK SURGERY     novemver 2019   STUMP REVISION Left 06/27/2021   Procedure: REVISION LEFT BELOW KNEE AMPUTATION;  Surgeon: [name], MD;  Location: [facility];  Service: Orthopedics;  Laterality: Left;    TRANSURETHRAL RESECTION OF PROSTATE N/A 06/28/2020   Procedure: TRANSURETHRAL RESECTION OF THE PROSTATE (TURP);  Surgeon: [name], MD;  Location: [facility];  Service: Urology;  Laterality: N/A;   WISDOM TOOTH EXTRACTION     Social History   Occupational History   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  Tobacco Use   Smoking status: Former    Types: Cigars    Start date: 04/08/1973    Quit date: 12/24/1988    Years since quitting: 33.3   Smokeless tobacco: Never   Tobacco comments:    Cigars and Pipe   Vaping Use   Vaping Use: Never used  Substance and Sexual Activity   Alcohol use: Never   Drug use: Never   Sexual activity: Yes    Partners: Female

## 2022-05-14 ENCOUNTER — Telehealth: Payer: Self-pay

## 2022-05-14 NOTE — Telephone Encounter (Signed)
Patient called concerning his right foot.  Talked with Autumn F., Dr. Jess Barters assistant.  Advised by Autumn F., to work patient into  the schedule for Wednesday, 05/15/2022.  Patient voiced that he understands.

## 2022-05-15 ENCOUNTER — Ambulatory Visit (INDEPENDENT_AMBULATORY_CARE_PROVIDER_SITE_OTHER): Payer: Medicare Other | Admitting: Family

## 2022-05-15 ENCOUNTER — Encounter: Payer: Self-pay | Admitting: Family

## 2022-05-15 DIAGNOSIS — L02611 Cutaneous abscess of right foot: Secondary | ICD-10-CM

## 2022-05-15 DIAGNOSIS — L97411 Non-pressure chronic ulcer of right heel and midfoot limited to breakdown of skin: Secondary | ICD-10-CM

## 2022-05-15 MED ORDER — DOXYCYCLINE HYCLATE 100 MG PO TABS: 100.0000 mg | ORAL_TABLET | Freq: Two times a day (BID) | ORAL | 0 refills | Status: DC

## 2022-05-15 NOTE — Progress Notes (Signed)
Office Visit Note   Patient: [name]           Date of Birth: 1951/03/30           MRN: [id number] Visit Date: 05/15/2022              Requested by: Lorenda Hatchet, FNP [id number] [place],  [place] [id number] PCP: Lorenda Hatchet, FNP  Chief Complaint  Patient presents with   Right Foot - Wound Check      HPI: The patient is a 72 year old gentleman who presents today with a 2-day history of a new midfoot plantar ulcer with bloody drainage.  This is a new ulcer for him he endorses feeling malaise and chills.  No fever.  Assessment & Plan: Visit Diagnoses: No diagnosis found.  Plan: Discussed surgical debridement.  Patient in agreement with plan we will plan for irrigation debridement of the abscess Friday of this week.  Placed on a course of doxycycline and advised nonweightbearing and daily Dial soap cleansing dry dressings return precautions.  Follow-Up Instructions: No follow-ups on file.   Ortho Exam  Patient is alert, oriented, no adenopathy, well-dressed, normal affect, normal respiratory effort. On examination of the right foot there is a midfoot ulcer which is 7 mm in diameter 7 mm deep this does not probe to bone today.  There is purulent drainage and surrounding erythema.  Imaging: No results found. No images are attached to the encounter.  Labs: Lab Results  Component Value Date   HGBA1C 5.5 05/04/2021   HGBA1C 6.3 (H) 02/02/2021   HGBA1C 5.4 09/13/2020   ESRSEDRATE 34 (H) 01/24/2020   ESRSEDRATE 41 (H) 01/10/2020   ESRSEDRATE 55 (H) 12/27/2019   CRP 8.8 (H) 01/24/2020   CRP 35.9 (H) 01/10/2020   CRP 77.9 (H) 12/27/2019   REPTSTATUS 08/23/2021 FINAL 08/18/2021   GRAMSTAIN  06/27/2021    RARE WBC PRESENT, PREDOMINANTLY MONONUCLEAR NO ORGANISMS SEEN    CULT  08/18/2021    NO GROWTH 5 DAYS Performed at [facility], [place] [address] [place], [place] [address]    LABORGA PSEUDOMONAS AERUGINOSA 06/27/2021     Lab  Results  Component Value Date   ALBUMIN 4.1 12/20/2021   ALBUMIN 3.3 (L) 08/20/2021   ALBUMIN 3.7 08/19/2021   PREALBUMIN 32.7 05/09/2021   PREALBUMIN 14.8 (L) 12/14/2018    Lab Results  Component Value Date   MG 2.4 05/14/2021   MG 1.9 05/09/2021   MG 2.0 06/28/2020   Lab Results  Component Value Date   VD25OH 42.0 11/19/2021   VD25OH 16.13 (L) 05/09/2021    Lab Results  Component Value Date   PREALBUMIN 32.7 05/09/2021   PREALBUMIN 14.8 (L) 12/14/2018      Latest Ref Rng & Units 12/20/2021    3:33 PM 08/20/2021    1:44 AM 08/19/2021    6:10 AM  CBC EXTENDED  WBC 4.0 - 10.5 K/uL 4.8  4.7  6.3   RBC 4.22 - 5.81 Mil/uL 3.62  3.18  3.45   Hemoglobin 13.0 - 17.0 g/dL 10.1  9.0  9.7   HCT 39.0 - 52.0 % 30.0  27.8  31.1   Platelets 150.0 - 400.0 K/uL 77.0  82  83   NEUT# 1.4 - 7.7 K/uL 3.9     Lymph# 0.7 - 4.0 K/uL 0.3        There is no height or weight on file to calculate BMI.  Orders:  No orders of the defined types were placed in this encounter.  No orders of the defined types were placed in this encounter.    Procedures: No procedures performed  Clinical Data: No additional findings.  ROS:  All other systems negative, except as noted in the HPI. Review of Systems  Objective: Vital Signs: There were no vitals taken for this visit.  Specialty Comments:  No specialty comments available.  PMFS History: Patient Active Problem List   Diagnosis Date Noted   Contraindication to anticoagulation therapy GU Bleed 03/13/2022   Community acquired pneumonia of left lower lobe of lung 08/18/2021   Paroxysmal atrial fibrillation ([place]) 08/18/2021   Anemia of chronic renal failure 08/18/2021   Thrombocytopenia ([place]) 82/50/5397   Acute metabolic encephalopathy 67/34/1937   Action induced myoclonus 08/18/2021   Left below-knee amputee ([place]) 05/11/2021   Symptomatic anemia 09/13/2020   COVID-19 virus infection 09/13/2020   Pressure injury of skin 06/29/2020    Gross hematuria 06/16/2020   Typical atrial flutter ([place]) 03/24/2020   Bilateral pleural effusion 02/24/2020   Healthcare maintenance 01/28/2020   Shortness of breath 01/28/2020   History of partial ray amputation of fourth toe of right foot ([place]) 09/08/2019   Chronic cough 06/25/2019   AKI (acute kidney injury) ([place]) 12/14/2018   ESRD on hemodialysis ([place]) 12/14/2018   Gait abnormality 08/24/2018   Diabetic neuropathy ([place]) 02/06/2018   Cervical myelopathy ([place]) 02/06/2018   Onychomycosis 10/30/2017   Other spondylosis with radiculopathy, cervical region 01/27/2017   Midfoot ulcer, right, limited to breakdown of skin ([place]) 11/15/2016   Lateral epicondylitis, left elbow 08/12/2016   Prostate cancer ([place]) 06/19/2016   Gout 09/14/2012   Hypertension associated with diabetes ([place]) 09/14/2012   Hypothyroidism 09/14/2012   CAD (coronary artery disease) 09/14/2012   Insulin dependent type 2 diabetes mellitus ([place]) 09/14/2012   Hyperlipidemia associated with type 2 diabetes mellitus ([place])    Neuropathy (HCC)    Past Medical History:  Diagnosis Date   Acute osteomyelitis of metatarsal bone of left foot ([place])    Ambulates with cane    straight cane   Anemia    Cervical myelopathy ([place]) 02/06/2018   Chronic kidney disease    dailysis M W F- home   Complication of anesthesia    Coronary artery disease    Dehiscence of amputation stump of left lower extremity ([place])    Diabetes mellitus without complication ([place])    type2   Diabetic foot ulcer ([place])    Diabetic neuropathy ([place]) 02/06/2018   Gait abnormality 08/24/2018   GERD (gastroesophageal reflux disease)     01/06/20- not current   Gout    History of blood transfusion    History of blood transfusion    History of kidney stones    passed stones   Hypercholesteremia    Hypertension    Hypothyroidism    Neuromuscular disorder (HCC)    neuropathy left leg and bilateral feet   Neuropathy    Partial nontraumatic amputation of  foot, left ([place]) 02/20/2021   PONV (postoperative nausea and vomiting)    Prostate cancer ([place])    PVD (peripheral vascular disease) ([place])    with amputations    Family History  Problem Relation Age of Onset   Diabetes Mellitus II Mother    Kidney disease Mother    Diabetes Mellitus II Father    CAD Father    Cancer Father        prostate   Kidney disease Father  Diabetes Mellitus II Brother    Kidney disease Brother    Diabetes Mellitus II Brother    Stomach cancer Brother 58   Kidney disease Brother    Colon cancer Neg Hx    Colon polyps Neg Hx    Esophageal cancer Neg Hx    Rectal cancer Neg Hx    Pancreatic cancer Neg Hx     Past Surgical History:  Procedure Laterality Date   A-FLUTTER ABLATION N/A 04/06/2020   Procedure: A-FLUTTER ABLATION;  Surgeon: [name], MD;  Location: [facility];  Service: Cardiovascular;  Laterality: N/A;   AMPUTATION Left 12/25/2013   Procedure: AMPUTATION RAY LEFT 5TH RAY;  Surgeon: [name], MD;  [facility];  Service: Orthopedics;  Laterality: Left;   AMPUTATION Right 12/15/2018   Procedure: AMPUTATION OF 4TH AND 5TH TOES RIGHT FOOT;  Surgeon: [name], MD;  Location: [facility];  Service: Orthopedics;  Laterality: Right;   AMPUTATION Left 02/02/2021   Procedure: LEFT FOOT 4TH RAY AMPUTATION;  Surgeon: [name], MD;  Location: [facility];  Service: Orthopedics;  Laterality: Left;   AMPUTATION Left 05/09/2021   Procedure: LEFT BELOW KNEE AMPUTATION;  Surgeon: [name], MD;  Location: [facility];  Service: Orthopedics;  Laterality: Left;   APPLICATION OF WOUND VAC Right 12/15/2018   Procedure: APPLICATION OF WOUND VAC;  Surgeon: [name], MD;  Location: [facility];  Service: Orthopedics;  Laterality: Right;   AV FISTULA PLACEMENT Left 03/25/2019   Procedure: LEFT ARM ARTERIOVENOUS (AV) FISTULA CREATION;  Surgeon: [name], MD;  Location: [facility];  Service: Vascular;  Laterality: Left;   [place] Left 05/20/2019   Procedure: SECOND STAGE LEFT BASCILIC VEIN TRANSPOSITION;  Surgeon: [name], MD;  Location: [facility];  Service: Vascular;  Laterality: Left;   CARDIAC CATHETERIZATION  02/17/2014   CHOLECYSTECTOMY     COLONOSCOPY  2011   in [place], [place]  2008   CYSTOSCOPY N/A 06/29/2020   Procedure: [place];  Surgeon: [name], MD;  Location: [facility];  Service: Urology;  Laterality: N/A;   CYSTOSCOPY WITH FULGERATION Bilateral 06/17/2020   Procedure: CYSTOSCOPY,BILATERAL RETROGRADE, CLOT EVACUATION WITH FULGERATION OF THE BLADDER;  Surgeon: Janith Lima, MD;  Location: [facility];  Service: Urology;  Laterality: Bilateral;   CYSTOSCOPY WITH FULGERATION N/A 06/28/2020   Procedure: CYSTOSCOPY WITH CLOT EVACUATION AND FULGERATION OF BLEEDERS;  Surgeon: [name], MD;  Location: [facility];  Service: Urology;  Laterality: N/A;  1 HR   I & D EXTREMITY Right 12/15/2018   Procedure: DEBRIDEMENT RIGHT FOOT;  Surgeon: [name], MD;  Location: [facility];  Service: Orthopedics;  Laterality: Right;   I & D EXTREMITY Right 08/27/2019   Procedure: PARTIAL CUBOID EXCISION RIGHT FOOT;  Surgeon: [name], MD;  Location: [facility];  Service: Orthopedics;  Laterality: Right;   I & D EXTREMITY Right 01/07/2020   Procedure: RIGHT FOOT EXCISION INFECTED BONE;  Surgeon: [name], MD;  Location: [facility];  Service: Orthopedics;  Laterality: Right;   NECK SURGERY     novemver 2019   STUMP REVISION Left 06/27/2021   Procedure: REVISION LEFT BELOW KNEE AMPUTATION;  Surgeon: [name], MD;  Location: [facility];  Service: Orthopedics;  Laterality: Left;   TRANSURETHRAL RESECTION OF PROSTATE N/A 06/28/2020   Procedure:  TRANSURETHRAL RESECTION OF THE PROSTATE (TURP);  Surgeon: [name], MD;  Location: [facility];  Service: Urology;  Laterality: N/A;   WISDOM TOOTH EXTRACTION     Social History    Occupational History   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

## 2022-05-16 ENCOUNTER — Other Ambulatory Visit: Payer: Self-pay

## 2022-05-16 ENCOUNTER — Encounter (HOSPITAL_COMMUNITY): Payer: Self-pay | Admitting: Orthopedic Surgery

## 2022-05-16 NOTE — Progress Notes (Signed)
Spoke with pt for pre-op call. History of CAD, CABG done in 2008. Pt has hx of A-fib/Aflutter. Sees Dr. Einar Gip. Pt had A- flutter ablation in 2021, but states he pretty much stays in A-fib. Pt is a type 2 diabetic. Last A1C was 5.3 on 05/13/22. He states he checks his blood sugar once a week and this week it was 106. Pt is on Ozempic. Last dose was yesterday, 05/15/22.  Pt is on home hemodialysis M/W/F.  Bath instructions given to pt and he voiced understanding.

## 2022-05-16 NOTE — H&P (Signed)
[name] is an 72 y.o. male.   Chief Complaint: Acute abscess right foot with systemic symptoms. HPI: Patient is a 72 year old gentleman who is seen in follow-up for left transtibial amputation and cavovarus collapse of the right lower extremity with a lateral ulcer currently using extra-depth shoes and double upright braces. Patient is currently received his socket list socket 2 days ago.   Past Medical History:  Diagnosis Date   Acute osteomyelitis of metatarsal bone of left foot ([name])    Ambulates with cane    straight cane   Anemia    Cervical myelopathy (HCC) 02/06/2018   Chronic kidney disease    dailysis M W F- home   Complication of anesthesia    Coronary artery disease    Dehiscence of amputation stump of left lower extremity ([name])    Diabetes mellitus without complication ([name])    type2   Diabetic foot ulcer ([name])    Diabetic neuropathy ([name]) 02/06/2018   Dysrhythmia    Gait abnormality 08/24/2018   GERD (gastroesophageal reflux disease)     01/06/20- not current   Gout    History of blood transfusion    History of blood transfusion    History of kidney stones    passed stones   Hypercholesteremia    Hypertension    Hypothyroidism    Neuromuscular disorder (HCC)    neuropathy left leg and bilateral feet   Neuropathy    Partial nontraumatic amputation of foot, left ([name]) 02/20/2021   PONV (postoperative nausea and vomiting)    Prostate cancer ([name])    PVD (peripheral vascular disease) ([name])    with amputations    Past Surgical History:  Procedure Laterality Date   A-FLUTTER ABLATION N/A 04/06/2020   Procedure: A-FLUTTER ABLATION;  Surgeon: [name], MD;  Location: [facility];  Service: Cardiovascular;  Laterality: N/A;   AMPUTATION Left 12/25/2013   Procedure: AMPUTATION RAY LEFT 5TH RAY;  Surgeon: [name], MD;  [facility];  Service: Orthopedics;  Laterality: Left;   AMPUTATION Right 12/15/2018   Procedure: AMPUTATION OF 4TH AND 5TH  TOES RIGHT FOOT;  Surgeon: [name], MD;  Location: [facility];  Service: Orthopedics;  Laterality: Right;   AMPUTATION Left 02/02/2021   Procedure: LEFT FOOT 4TH RAY AMPUTATION;  Surgeon: [name], MD;  Location: [facility];  Service: Orthopedics;  Laterality: Left;   AMPUTATION Left 05/09/2021   Procedure: LEFT BELOW KNEE AMPUTATION;  Surgeon: [name], MD;  Location: [facility];  Service: Orthopedics;  Laterality: Left;   APPLICATION OF WOUND VAC Right 12/15/2018   Procedure: APPLICATION OF WOUND VAC;  Surgeon: [name], MD;  Location: [facility];  Service: Orthopedics;  Laterality: Right;   AV FISTULA PLACEMENT Left 03/25/2019   Procedure: LEFT ARM ARTERIOVENOUS (AV) FISTULA CREATION;  Surgeon: [name], MD;  Location: [facility];  Service: Vascular;  Laterality: Left;   [place] Left 05/20/2019   Procedure: SECOND STAGE LEFT BASCILIC VEIN TRANSPOSITION;  Surgeon: [name], MD;  Location: [facility];  Service: Vascular;  Laterality: Left;   CARDIAC CATHETERIZATION  02/17/2014   CHOLECYSTECTOMY     COLONOSCOPY  2011   in [place], [place]  2008   CYSTOSCOPY N/A 06/29/2020   Procedure: [place];  Surgeon: [name], MD;  Location: [facility];  Service: Urology;  Laterality: N/A;   CYSTOSCOPY WITH FULGERATION Bilateral 06/17/2020   Procedure: CYSTOSCOPY,BILATERAL RETROGRADE, CLOT EVACUATION WITH FULGERATION OF THE BLADDER;  Surgeon: Janith Lima, MD;  Location: [facility];  Service: Urology;  Laterality: Bilateral;   CYSTOSCOPY WITH FULGERATION N/A 06/28/2020   Procedure: CYSTOSCOPY WITH CLOT EVACUATION AND FULGERATION OF BLEEDERS;  Surgeon: [name], MD;  Location: [facility];  Service: Urology;  Laterality: N/A;  1 HR   I & D EXTREMITY Right 12/15/2018   Procedure: DEBRIDEMENT RIGHT FOOT;  Surgeon: [name], MD;  Location: [facility];  Service: Orthopedics;  Laterality:  Right;   I & D EXTREMITY Right 08/27/2019   Procedure: PARTIAL CUBOID EXCISION RIGHT FOOT;  Surgeon: [name], MD;  Location: [facility];  Service: Orthopedics;  Laterality: Right;   I & D EXTREMITY Right 01/07/2020   Procedure: RIGHT FOOT EXCISION INFECTED BONE;  Surgeon: [name], MD;  Location: [facility];  Service: Orthopedics;  Laterality: Right;   NECK SURGERY     novemver 2019   STUMP REVISION Left 06/27/2021   Procedure: REVISION LEFT BELOW KNEE AMPUTATION;  Surgeon: [name], MD;  Location: [facility];  Service: Orthopedics;  Laterality: Left;   TRANSURETHRAL RESECTION OF PROSTATE N/A 06/28/2020   Procedure: TRANSURETHRAL RESECTION OF THE PROSTATE (TURP);  Surgeon: [name], MD;  Location: [facility];  Service: Urology;  Laterality: N/A;   WISDOM TOOTH EXTRACTION      Family History  Problem Relation Age of Onset   Diabetes Mellitus II Mother    Kidney disease Mother    Diabetes Mellitus II Father    CAD Father    Cancer Father        prostate   Kidney disease Father    Diabetes Mellitus II Brother    Kidney disease Brother    Diabetes Mellitus II Brother    Stomach cancer Brother 40   Kidney disease Brother    Colon cancer Neg Hx    Colon polyps Neg Hx    Esophageal cancer Neg Hx    Rectal cancer Neg Hx    Pancreatic cancer Neg Hx    Social History:  reports that he quit smoking about 33 years ago. His smoking use included cigars. He started smoking about 49 years ago. He has never used smokeless tobacco. He reports that he does not drink alcohol and does not use drugs.  Allergies:  Allergies  Allergen Reactions   Morphine     Other reaction(s): Delusions (intolerance)   Mushroom Extract Complex Nausea Only    No medications prior to admission.    No results found for this or any previous visit (from the past 48 hour(s)). No results found.  Review of Systems  All other systems reviewed and are negative.   There were no vitals taken for this  visit. Physical Exam  Patient is alert, oriented, no adenopathy, well-dressed, normal affect, normal respiratory effort. Examination patient has a fixed varus deformity of the right foot status post lateral column amputation.  He has an ulcer over the base of the fifth metatarsal cuboid.  There is purulent drainage and surrounding erythema.  This is an acute ulcer.  Patient did not have symptoms 2 days ago.  Patient has no ulcers in the left transtibial amputation. Assessment/Plan Assessment: Acute abscess and ulceration right foot with purulent drainage.  Plan: Will plan for surgical debridement right foot.  Evaluate for inpatient versus outpatient therapy from the surgical debridement.  Illene Regulus  [name], MD 05/16/2022, 5:37 PM

## 2022-05-17 ENCOUNTER — Ambulatory Visit (HOSPITAL_BASED_OUTPATIENT_CLINIC_OR_DEPARTMENT_OTHER): Payer: Medicare Other | Admitting: Anesthesiology

## 2022-05-17 ENCOUNTER — Observation Stay (HOSPITAL_COMMUNITY)
Admission: RE | Admit: 2022-05-17 | Discharge: 2022-05-18 | Disposition: A | Discharge status: Home / Self Care | Payer: Medicare Other | Source: Ambulatory Visit | Attending: Orthopedic Surgery | Admitting: Orthopedic Surgery

## 2022-05-17 ENCOUNTER — Ambulatory Visit (HOSPITAL_COMMUNITY): Payer: Medicare Other | Admitting: Anesthesiology

## 2022-05-17 ENCOUNTER — Other Ambulatory Visit: Payer: Self-pay

## 2022-05-17 ENCOUNTER — Encounter (HOSPITAL_COMMUNITY)
Admission: RE | Disposition: A | Discharge status: Home / Self Care | Payer: Self-pay | Source: Ambulatory Visit | Attending: Orthopedic Surgery

## 2022-05-17 ENCOUNTER — Encounter (HOSPITAL_COMMUNITY): Payer: Self-pay | Admitting: Orthopedic Surgery

## 2022-05-17 DIAGNOSIS — I129 Hypertensive chronic kidney disease with stage 1 through stage 4 chronic kidney disease, or unspecified chronic kidney disease: Secondary | ICD-10-CM | POA: Insufficient documentation

## 2022-05-17 DIAGNOSIS — E1122 Type 2 diabetes mellitus with diabetic chronic kidney disease: Secondary | ICD-10-CM | POA: Insufficient documentation

## 2022-05-17 DIAGNOSIS — E039 Hypothyroidism, unspecified: Secondary | ICD-10-CM | POA: Diagnosis not present

## 2022-05-17 DIAGNOSIS — E1140 Type 2 diabetes mellitus with diabetic neuropathy, unspecified: Secondary | ICD-10-CM | POA: Insufficient documentation

## 2022-05-17 DIAGNOSIS — N189 Chronic kidney disease, unspecified: Secondary | ICD-10-CM | POA: Insufficient documentation

## 2022-05-17 DIAGNOSIS — Z87891 Personal history of nicotine dependence: Secondary | ICD-10-CM

## 2022-05-17 DIAGNOSIS — Z951 Presence of aortocoronary bypass graft: Secondary | ICD-10-CM | POA: Insufficient documentation

## 2022-05-17 DIAGNOSIS — L02611 Cutaneous abscess of right foot: Secondary | ICD-10-CM | POA: Diagnosis present

## 2022-05-17 DIAGNOSIS — N186 End stage renal disease: Secondary | ICD-10-CM

## 2022-05-17 DIAGNOSIS — I2510 Atherosclerotic heart disease of native coronary artery without angina pectoris: Secondary | ICD-10-CM | POA: Diagnosis not present

## 2022-05-17 DIAGNOSIS — I120 Hypertensive chronic kidney disease with stage 5 chronic kidney disease or end stage renal disease: Secondary | ICD-10-CM | POA: Diagnosis not present

## 2022-05-17 DIAGNOSIS — Z8546 Personal history of malignant neoplasm of prostate: Secondary | ICD-10-CM | POA: Diagnosis not present

## 2022-05-17 HISTORY — DX: Cardiac arrhythmia, unspecified: I49.9

## 2022-05-17 HISTORY — PX: I & D EXTREMITY: SHX5045

## 2022-05-17 LAB — GLUCOSE, CAPILLARY
Glucose-Capillary: 98 mg/dL (ref 70–99)
Glucose-Capillary: 102 mg/dL — ABNORMAL HIGH (ref 70–99)
Glucose-Capillary: 111 mg/dL — ABNORMAL HIGH (ref 70–99)
Glucose-Capillary: 150 mg/dL — ABNORMAL HIGH (ref 70–99)
Glucose-Capillary: 185 mg/dL — ABNORMAL HIGH (ref 70–99)

## 2022-05-17 LAB — POCT I-STAT, CHEM 8
BUN: 77 mg/dL — ABNORMAL HIGH (ref 8–23)
Calcium, Ion: 1.02 mmol/L — ABNORMAL LOW (ref 1.15–1.40)
Chloride: 95 mmol/L — ABNORMAL LOW (ref 98–111)
Creatinine, Ser: 9.6 mg/dL — ABNORMAL HIGH (ref 0.61–1.24)
Glucose, Bld: 101 mg/dL — ABNORMAL HIGH (ref 70–99)
HCT: 25 % — ABNORMAL LOW (ref 39.0–52.0)
Hemoglobin: 8.5 g/dL — ABNORMAL LOW (ref 13.0–17.0)
Potassium: 4.2 mmol/L (ref 3.5–5.1)
Sodium: 134 mmol/L — ABNORMAL LOW (ref 135–145)
TCO2: 27 mmol/L (ref 22–32)

## 2022-05-17 LAB — HEMOGLOBIN A1C
Hgb A1c MFr Bld: 5 % (ref 4.8–5.6)
Mean Plasma Glucose: 96.8 mg/dL

## 2022-05-17 SURGERY — IRRIGATION AND DEBRIDEMENT EXTREMITY
Anesthesia: General | Laterality: Right

## 2022-05-17 MED ORDER — VANCOMYCIN HCL 1000 MG IV SOLR
INTRAVENOUS | Status: DC | PRN
  Administered 2022-05-17: 1000 mg

## 2022-05-17 MED ORDER — PROPOFOL 10 MG/ML IV BOLUS
INTRAVENOUS | Status: AC
  Filled 2022-05-17: qty 20

## 2022-05-17 MED ORDER — FENTANYL CITRATE (PF) 250 MCG/5ML IJ SOLN
INTRAMUSCULAR | Status: AC
  Filled 2022-05-17: qty 5

## 2022-05-17 MED ORDER — OXYCODONE HCL 5 MG PO TABS: 10.0000 mg | ORAL_TABLET | ORAL | Status: DC | PRN

## 2022-05-17 MED ORDER — CEFAZOLIN SODIUM-DEXTROSE 2-4 GM/100ML-% IV SOLN
INTRAVENOUS | Status: AC
  Filled 2022-05-17: qty 100

## 2022-05-17 MED ORDER — BISACODYL 10 MG RE SUPP: 10.0000 mg | Freq: Every day | RECTAL | Status: DC | PRN

## 2022-05-17 MED ORDER — FENTANYL CITRATE (PF) 100 MCG/2ML IJ SOLN
INTRAMUSCULAR | Status: DC | PRN
  Administered 2022-05-17: 50 ug via INTRAVENOUS

## 2022-05-17 MED ORDER — CARVEDILOL 12.5 MG PO TABS
ORAL_TABLET | ORAL | Status: AC
  Filled 2022-05-17: qty 1

## 2022-05-17 MED ORDER — CHLORHEXIDINE GLUCONATE 0.12 % MT SOLN: 15.0000 mL | Freq: Once | OROMUCOSAL | Status: AC

## 2022-05-17 MED ORDER — LIDOCAINE 2% (20 MG/ML) 5 ML SYRINGE
INTRAMUSCULAR | Status: AC
  Filled 2022-05-17: qty 5

## 2022-05-17 MED ORDER — LEVOTHYROXINE SODIUM 112 MCG PO TABS
112.0000 ug | ORAL_TABLET | Freq: Every day | ORAL | Status: DC
  Administered 2022-05-18: 112 ug via ORAL
  Filled 2022-05-17: qty 1

## 2022-05-17 MED ORDER — POLYVINYL ALCOHOL 1.4 % OP SOLN
1.0000 [drp] | Freq: Two times a day (BID) | OPHTHALMIC | Status: DC
  Filled 2022-05-17: qty 15

## 2022-05-17 MED ORDER — IBUPROFEN 200 MG PO TABS
200.0000 mg | ORAL_TABLET | Freq: Four times a day (QID) | ORAL | Status: DC | PRN
  Administered 2022-05-17 – 2022-05-18 (×3): 200 mg via ORAL
  Filled 2022-05-17 (×3): qty 1

## 2022-05-17 MED ORDER — OXYCODONE HCL 5 MG/5ML PO SOLN: 5.0000 mg | Freq: Once | ORAL | Status: DC | PRN

## 2022-05-17 MED ORDER — LOSARTAN POTASSIUM 50 MG PO TABS
50.0000 mg | ORAL_TABLET | Freq: Two times a day (BID) | ORAL | Status: DC
  Administered 2022-05-17 (×2): 50 mg via ORAL
  Filled 2022-05-17 (×2): qty 1

## 2022-05-17 MED ORDER — METHOCARBAMOL 1000 MG/10ML IJ SOLN: 500.0000 mg | Freq: Four times a day (QID) | INTRAVENOUS | Status: DC | PRN

## 2022-05-17 MED ORDER — CARVEDILOL 12.5 MG PO TABS
12.5000 mg | ORAL_TABLET | Freq: Two times a day (BID) | ORAL | Status: DC
  Administered 2022-05-17: 12.5 mg via ORAL
  Filled 2022-05-17: qty 1

## 2022-05-17 MED ORDER — 0.9 % SODIUM CHLORIDE (POUR BTL) OPTIME
TOPICAL | Status: DC | PRN
  Administered 2022-05-17: 1000 mL

## 2022-05-17 MED ORDER — CHLORHEXIDINE GLUCONATE 0.12 % MT SOLN
OROMUCOSAL | Status: AC
  Administered 2022-05-17: 15 mL via OROMUCOSAL
  Filled 2022-05-17: qty 15

## 2022-05-17 MED ORDER — DOCUSATE SODIUM 100 MG PO CAPS
100.0000 mg | ORAL_CAPSULE | Freq: Two times a day (BID) | ORAL | Status: DC
  Filled 2022-05-17: qty 1

## 2022-05-17 MED ORDER — CEFAZOLIN SODIUM-DEXTROSE 2-4 GM/100ML-% IV SOLN
2.0000 g | INTRAVENOUS | Status: AC
  Administered 2022-05-17: 2 g via INTRAVENOUS

## 2022-05-17 MED ORDER — ONDANSETRON HCL 4 MG PO TABS: 4.0000 mg | ORAL_TABLET | Freq: Four times a day (QID) | ORAL | Status: DC | PRN

## 2022-05-17 MED ORDER — ACETAMINOPHEN 325 MG PO TABS: 325.0000 mg | ORAL_TABLET | Freq: Four times a day (QID) | ORAL | Status: DC | PRN

## 2022-05-17 MED ORDER — ORAL CARE MOUTH RINSE: 15.0000 mL | Freq: Once | OROMUCOSAL | Status: AC

## 2022-05-17 MED ORDER — PROPOFOL 10 MG/ML IV BOLUS
INTRAVENOUS | Status: DC | PRN
  Administered 2022-05-17: 130 mg via INTRAVENOUS

## 2022-05-17 MED ORDER — PREGABALIN 75 MG PO CAPS
150.0000 mg | ORAL_CAPSULE | Freq: Three times a day (TID) | ORAL | Status: DC
  Administered 2022-05-17 (×2): 150 mg via ORAL
  Filled 2022-05-17 (×2): qty 2

## 2022-05-17 MED ORDER — ONDANSETRON HCL 4 MG/2ML IJ SOLN
INTRAMUSCULAR | Status: DC | PRN
  Administered 2022-05-17: 4 mg via INTRAVENOUS

## 2022-05-17 MED ORDER — OXYCODONE HCL 5 MG PO TABS: 5.0000 mg | ORAL_TABLET | Freq: Once | ORAL | Status: DC | PRN

## 2022-05-17 MED ORDER — ONDANSETRON HCL 4 MG/2ML IJ SOLN
INTRAMUSCULAR | Status: AC
  Filled 2022-05-17: qty 2

## 2022-05-17 MED ORDER — SUCCINYLCHOLINE CHLORIDE 200 MG/10ML IV SOSY
PREFILLED_SYRINGE | INTRAVENOUS | Status: DC | PRN
  Administered 2022-05-17: 120 mg via INTRAVENOUS

## 2022-05-17 MED ORDER — MAGNESIUM CITRATE PO SOLN: 1.0000 | Freq: Once | ORAL | Status: DC | PRN

## 2022-05-17 MED ORDER — ROSUVASTATIN CALCIUM 5 MG PO TABS
10.0000 mg | ORAL_TABLET | Freq: Every day | ORAL | Status: DC
  Administered 2022-05-17: 10 mg via ORAL
  Filled 2022-05-17: qty 2

## 2022-05-17 MED ORDER — SODIUM CHLORIDE 0.9 % IV SOLN: INTRAVENOUS | Status: DC

## 2022-05-17 MED ORDER — DEXAMETHASONE SODIUM PHOSPHATE 10 MG/ML IJ SOLN
INTRAMUSCULAR | Status: AC
  Filled 2022-05-17: qty 1

## 2022-05-17 MED ORDER — SEVELAMER CARBONATE 800 MG PO TABS
800.0000 mg | ORAL_TABLET | Freq: Three times a day (TID) | ORAL | Status: DC
  Administered 2022-05-17: 800 mg via ORAL
  Filled 2022-05-17: qty 1

## 2022-05-17 MED ORDER — ONDANSETRON HCL 4 MG/2ML IJ SOLN: 4.0000 mg | Freq: Four times a day (QID) | INTRAMUSCULAR | Status: DC | PRN

## 2022-05-17 MED ORDER — PHENYLEPHRINE HCL-NACL 20-0.9 MG/250ML-% IV SOLN
INTRAVENOUS | Status: DC | PRN
  Administered 2022-05-17: 50 ug/min via INTRAVENOUS

## 2022-05-17 MED ORDER — DEXAMETHASONE SODIUM PHOSPHATE 4 MG/ML IJ SOLN
INTRAMUSCULAR | Status: DC | PRN
  Administered 2022-05-17: 4 mg via INTRAVENOUS

## 2022-05-17 MED ORDER — ONDANSETRON HCL 4 MG/2ML IJ SOLN: 4.0000 mg | Freq: Once | INTRAMUSCULAR | Status: DC | PRN

## 2022-05-17 MED ORDER — CEFAZOLIN SODIUM-DEXTROSE 1-4 GM/50ML-% IV SOLN
1.0000 g | Freq: Four times a day (QID) | INTRAVENOUS | Status: AC
  Administered 2022-05-17 – 2022-05-18 (×3): 1 g via INTRAVENOUS
  Filled 2022-05-17 (×3): qty 50

## 2022-05-17 MED ORDER — SUCCINYLCHOLINE CHLORIDE 200 MG/10ML IV SOSY
PREFILLED_SYRINGE | INTRAVENOUS | Status: AC
  Filled 2022-05-17: qty 10

## 2022-05-17 MED ORDER — METOCLOPRAMIDE HCL 5 MG/ML IJ SOLN: 5.0000 mg | Freq: Three times a day (TID) | INTRAMUSCULAR | Status: DC | PRN

## 2022-05-17 MED ORDER — INSULIN ASPART 100 UNIT/ML IJ SOLN: 2.0000 [IU] | Freq: Three times a day (TID) | INTRAMUSCULAR | Status: DC

## 2022-05-17 MED ORDER — FENTANYL CITRATE (PF) 100 MCG/2ML IJ SOLN: 25.0000 ug | INTRAMUSCULAR | Status: DC | PRN

## 2022-05-17 MED ORDER — ACETAMINOPHEN 500 MG PO TABS: 1000.0000 mg | ORAL_TABLET | Freq: Once | ORAL | Status: AC

## 2022-05-17 MED ORDER — HYDROMORPHONE HCL 1 MG/ML IJ SOLN: 0.5000 mg | INTRAMUSCULAR | Status: DC | PRN

## 2022-05-17 MED ORDER — OXYCODONE HCL 5 MG PO TABS: 5.0000 mg | ORAL_TABLET | ORAL | Status: DC | PRN

## 2022-05-17 MED ORDER — METOCLOPRAMIDE HCL 5 MG PO TABS: 5.0000 mg | ORAL_TABLET | Freq: Three times a day (TID) | ORAL | Status: DC | PRN

## 2022-05-17 MED ORDER — POLYETHYLENE GLYCOL 3350 17 G PO PACK: 17.0000 g | PACK | Freq: Every day | ORAL | Status: DC | PRN

## 2022-05-17 MED ORDER — INSULIN ASPART 100 UNIT/ML IJ SOLN: 0.0000 [IU] | Freq: Three times a day (TID) | INTRAMUSCULAR | Status: DC

## 2022-05-17 MED ORDER — ACETAMINOPHEN 500 MG PO TABS
ORAL_TABLET | ORAL | Status: AC
  Administered 2022-05-17: 1000 mg via ORAL
  Filled 2022-05-17: qty 2

## 2022-05-17 MED ORDER — LIDOCAINE 2% (20 MG/ML) 5 ML SYRINGE
INTRAMUSCULAR | Status: DC | PRN
  Administered 2022-05-17: 60 mg via INTRAVENOUS

## 2022-05-17 MED ORDER — CARVEDILOL 12.5 MG PO TABS: 12.5000 mg | ORAL_TABLET | Freq: Once | ORAL | Status: DC

## 2022-05-17 MED ORDER — VANCOMYCIN HCL 1000 MG IV SOLR
INTRAVENOUS | Status: AC
  Filled 2022-05-17: qty 20

## 2022-05-17 MED ORDER — METHOCARBAMOL 500 MG PO TABS: 500.0000 mg | ORAL_TABLET | Freq: Four times a day (QID) | ORAL | Status: DC | PRN

## 2022-05-17 MED ORDER — MODAFINIL 100 MG PO TABS
200.0000 mg | ORAL_TABLET | Freq: Every day | ORAL | Status: DC
  Filled 2022-05-17: qty 2

## 2022-05-17 MED ORDER — ALLOPURINOL 100 MG PO TABS
200.0000 mg | ORAL_TABLET | Freq: Every day | ORAL | Status: DC
  Administered 2022-05-17: 200 mg via ORAL
  Filled 2022-05-17: qty 2

## 2022-05-17 SURGICAL SUPPLY — 35 items
BAG COUNTER SPONGE SURGICOUNT (BAG) IMPLANT
BLADE SURG 21 STRL SS (BLADE) ×1 IMPLANT
BNDG COHESIVE 6X5 TAN NS LF (GAUZE/BANDAGES/DRESSINGS) IMPLANT
BNDG COHESIVE 6X5 TAN STRL LF (GAUZE/BANDAGES/DRESSINGS) IMPLANT
BNDG GAUZE DERMACEA FLUFF 4 (GAUZE/BANDAGES/DRESSINGS) ×2 IMPLANT
COVER SURGICAL LIGHT HANDLE (MISCELLANEOUS) ×2 IMPLANT
DRAPE U-SHAPE 47X51 STRL (DRAPES) ×1 IMPLANT
DRESSING VERAFLO CLEANS CC MED (GAUZE/BANDAGES/DRESSINGS) IMPLANT
DRSG ADAPTIC 3X8 NADH LF (GAUZE/BANDAGES/DRESSINGS) ×1 IMPLANT
DRSG VERAFLO CLEANSE CC MED (GAUZE/BANDAGES/DRESSINGS) ×1
DURAPREP 26ML APPLICATOR (WOUND CARE) ×1 IMPLANT
ELECT REM PT RETURN 9FT ADLT (ELECTROSURGICAL)
ELECTRODE REM PT RTRN 9FT ADLT (ELECTROSURGICAL) IMPLANT
GAUZE PAD ABD 8X10 STRL (GAUZE/BANDAGES/DRESSINGS) IMPLANT
GAUZE SPONGE 4X4 12PLY STRL (GAUZE/BANDAGES/DRESSINGS) ×1 IMPLANT
GLOVE BIOGEL PI IND STRL 9 (GLOVE) ×1 IMPLANT
GLOVE SURG ORTHO 9.0 STRL STRW (GLOVE) ×1 IMPLANT
GOWN STRL REUS W/ TWL XL LVL3 (GOWN DISPOSABLE) ×2 IMPLANT
GOWN STRL REUS W/TWL XL LVL3 (GOWN DISPOSABLE) ×2
GRAFT SKIN WND MICRO 38 (Tissue) IMPLANT
HANDPIECE INTERPULSE COAX TIP (DISPOSABLE)
KIT BASIN OR (CUSTOM PROCEDURE TRAY) ×1 IMPLANT
KIT TURNOVER KIT B (KITS) ×1 IMPLANT
MANIFOLD NEPTUNE II (INSTRUMENTS) ×1 IMPLANT
NS IRRIG 1000ML POUR BTL (IV SOLUTION) ×1 IMPLANT
PACK ORTHO EXTREMITY (CUSTOM PROCEDURE TRAY) ×1 IMPLANT
PAD ARMBOARD 7.5X6 YLW CONV (MISCELLANEOUS) ×2 IMPLANT
SET HNDPC FAN SPRY TIP SCT (DISPOSABLE) IMPLANT
STOCKINETTE IMPERVIOUS 9X36 MD (GAUZE/BANDAGES/DRESSINGS) IMPLANT
SUT ETHILON 2 0 PSLX (SUTURE) ×1 IMPLANT
SWAB COLLECTION DEVICE MRSA (MISCELLANEOUS) ×1 IMPLANT
SWAB CULTURE ESWAB REG 1ML (MISCELLANEOUS) IMPLANT
TOWEL GREEN STERILE (TOWEL DISPOSABLE) ×1 IMPLANT
TUBE CONNECTING 12X1/4 (SUCTIONS) ×1 IMPLANT
YANKAUER SUCT BULB TIP NO VENT (SUCTIONS) ×1 IMPLANT

## 2022-05-17 NOTE — Transfer of Care (Signed)
Immediate Anesthesia Transfer of Care Note  Patient: [name]  Procedure(s) Performed: IRRIGATION AND DEBRIDEMENT RIGHT FOOT ABSCESS (Right)  Patient Location: PACU  Anesthesia Type:General  Level of Consciousness: awake  Airway & Oxygen Therapy: Patient Spontanous Breathing and Patient connected to face mask oxygen  Post-op Assessment: Report given to RN and Post -op Vital signs reviewed and stable  Post vital signs: Reviewed and stable  Last Vitals:  Vitals Value Taken Time  BP 141/63 05/17/22 1106  Temp    Pulse 67 05/17/22 1109  Resp 15 05/17/22 1109  SpO2 100 % 05/17/22 1109  Vitals shown include unvalidated device data.  Last Pain:  Vitals:   05/17/22 0835  TempSrc:   PainSc: 0-No pain         Complications: No notable events documented.

## 2022-05-17 NOTE — Anesthesia Preprocedure Evaluation (Addendum)
Anesthesia Evaluation  Patient identified by MRN, date of birth, ID band Patient awake    Reviewed: Allergy & Precautions, NPO status , Patient's Chart, lab work & pertinent test results, reviewed documented beta blocker date and time   History of Anesthesia Complications (+) PONV and history of anesthetic complications  Airway Mallampati: III  TM Distance: >3 FB Neck ROM: Full    Dental  (+) Dental Advisory Given   Pulmonary former smoker   Pulmonary exam normal        Cardiovascular hypertension, Pt. on home beta blockers and Pt. on medications + CAD, + CABG and + Peripheral Vascular Disease  Normal cardiovascular exam+ dysrhythmias Atrial Fibrillation    '21 Myoperfusion - Non-diagnostic ECG stress due to pharmacologic stress testing. Resting EKG demonstrated atrial flutter. Non-specific Twave abnormalities. Peak EKG revealed no significant ST-T change from baseline abnormality. Myocardial perfusion is normal. Mildly enlarged left ventricle. in both rest and stress. LV stress volume 170 ml. TID is normal. No stress lung uptake. Overall LV systolic function is low normal without regional wall motion  abnormalities. Stress LV EF: 51%.    Neuro/Psych  Neuromuscular disease  negative psych ROS   GI/Hepatic Neg liver ROS,GERD  Controlled and Medicated,,  Endo/Other  diabetes, Type 2Hypothyroidism   On GLP-1a   Renal/GU ESRF and DialysisRenal disease    Prostate cancer     Musculoskeletal  (+) Arthritis ,    Abdominal   Peds  Hematology negative hematology ROS (+)   Anesthesia Other Findings   Reproductive/Obstetrics                             Anesthesia Physical Anesthesia Plan  ASA: 3  Anesthesia Plan: General   Post-op Pain Management: Tylenol PO (pre-op)*   Induction: Intravenous, Rapid sequence and Cricoid pressure planned  PONV Risk Score and Plan: 3 and Treatment may  vary due to age or medical condition and Ondansetron  Airway Management Planned: Oral ETT  Additional Equipment: None  Intra-op Plan:   Post-operative Plan: Extubation in OR  Informed Consent: I have reviewed the patients History and Physical, chart, labs and discussed the procedure including the risks, benefits and alternatives for the proposed anesthesia with the patient or authorized representative who has indicated his/her understanding and acceptance.     Dental advisory given  Plan Discussed with: CRNA, Anesthesiologist and Surgeon  Anesthesia Plan Comments: (ETT with RSI due to recent GLP-1a use. Discussed with surgeon who says case needs to proceed due to infection concerns.)       Anesthesia Quick Evaluation

## 2022-05-17 NOTE — Progress Notes (Signed)
Orthopedic Tech Progress Note Patient Details:  [name] [birth year] [id number]  Patient ID: [name], male   DOB: 1951/03/03, 72 y.o.   MRN: [id number] Patient refused post op shoe stating he can only walk in his shoes that have the braces attached to them. Vernona Rieger 05/17/2022, 11:28 AM

## 2022-05-17 NOTE — Evaluation (Signed)
Physical Therapy Evaluation Patient Details Name: [name] MRN: [id number] DOB: 1950/11/22 Today's Date: 05/17/2022  History of Present Illness  72 y.o. male presents to [facility] hospital on 05/17/2022 for debridement of R foot abscess. PMH includes cervical myelopathy, CKD, CAD, DMII, GERD, HTN, prostate cancer.  Clinical Impression  Pt presents to PT with deficits in functional mobility, gait, balance, strength, power, sensation. Pt with chronic R drop foot, R foot in plantarflexion and inversion at rest. Pt wears ankle and foot orthotic to improve positioning of joint during mobility, however due to bulky post-op dressing he is unable to don this orthotic. During transfer attempts pt bears weight through lateral heel/ankle as foot rolls into inversion. With attempts at ambulation pt appears unable to maintain TDWB through LE, or heel WB as tolerated. PT secure chatted Dr. [name] after evaluation to determine if dressing can be minimized prior to discharge to allow use of orthotic during transfers. Acute PT will follow up tomorrow.        Recommendations for follow up therapy are one component of a multi-disciplinary discharge planning process, led by the attending physician.  Recommendations may be updated based on patient status, additional functional criteria and insurance authorization.  Follow Up Recommendations  (PT deferred after outpatient follow-up with Dr. [name])      Assistance Recommended at Discharge Intermittent Supervision/Assistance  Patient can return home with the following  A little help with walking and/or transfers;A little help with bathing/dressing/bathroom;Assistance with cooking/housework;Assist for transportation;Help with stairs or ramp for entrance    Equipment Recommendations None recommended by PT  Recommendations for Other Services       Functional Status Assessment Patient has had a recent decline in their functional status and demonstrates the ability to make  significant improvements in function in a reasonable and predictable amount of time.     Precautions / Restrictions Precautions Precautions: Fall Precaution Comments: L BKA Required Braces or Orthoses: Other Brace Other Brace: LLE prosthetic, R orthotic (seeking clarification with Dr. [name] on ability to wear for transfers/stepping) Restrictions Weight Bearing Restrictions: Yes RLE Weight Bearing: Touchdown weight bearing (WBAT through heel for transfers) LLE Weight Bearing:  (BKA)      Mobility  Bed Mobility Overal bed mobility: Needs Assistance Bed Mobility: Supine to Sit     Supine to sit: Supervision          Transfers Overall transfer level: Needs assistance Equipment used: Rolling walker (2 wheels) Transfers: Sit to/from Stand, Bed to chair/wheelchair/BSC Sit to Stand: Min guard   Step pivot transfers: Min guard       General transfer comment: pt with difficulty maintaining Heel WB due to drop foot and ankle inversion without orthotic.    Ambulation/Gait Ambulation/Gait assistance: Min guard Gait Distance (Feet): 5 Feet Assistive device: Rolling walker (2 wheels) Gait Pattern/deviations: Step-to pattern Gait velocity: reduced Gait velocity interpretation: <1.31 ft/sec, indicative of household ambulator   General Gait Details: pt reports bearing majority of weight on LLE and UE although stance time on RLE appears prolonged. Pt walking on lateral aspect of R foot/ankle. PT stops gait training due to inability to maintain TDWB and to protect foot/ankle integrity.3  Stairs            Wheelchair Mobility    Modified Rankin (Stroke Patients Only)       Balance Overall balance assessment: Needs assistance Sitting-balance support: No upper extremity supported, Feet supported Sitting balance-Leahy Scale: Normal     Standing balance support: Bilateral upper extremity  supported, Reliant on assistive device for balance Standing balance-Leahy Scale:  Poor                               Pertinent Vitals/Pain Pain Assessment Pain Assessment: Faces Faces Pain Scale: Hurts even more Pain Location: R foot Pain Descriptors / Indicators: Sore Pain Intervention(s): Monitored during session    Home Living Family/patient expects to be discharged to:: Private residence Living Arrangements: Spouse/significant other Available Help at Discharge: Family;Personal care attendant;Available 24 hours/day Type of Home: House Home Access: Stairs to enter Entrance Stairs-Rails: None Entrance Stairs-Number of Steps: 1 Alternate Level Stairs-Number of Steps: 1 Home Layout: Multi-level Home Equipment: Rollator (4 wheels);Shower seat;Cane - single Barista (2 wheels);Wheelchair - manual      Prior Function Prior Level of Function : Needs assist             Mobility Comments: ambulatory with use of rollator and prosthetic       Hand Dominance   Dominant Hand: Right    Extremity/Trunk Assessment   Upper Extremity Assessment Upper Extremity Assessment: Overall WFL for tasks assessed    Lower Extremity Assessment Lower Extremity Assessment: RLE deficits/detail;LLE deficits/detail RLE Deficits / Details: R ankle in inversion and PF due to neuropathy. Pt typically wears foot/ankle orthotic to improve stability however unable to fit into shoe due to bulk of dressings post-op RLE Sensation: history of peripheral neuropathy LLE Deficits / Details: L BKA LLE Sensation: history of peripheral neuropathy    Cervical / Trunk Assessment Cervical / Trunk Assessment: Normal  Communication   Communication: No difficulties  Cognition Arousal/Alertness: Awake/alert Behavior During Therapy: WFL for tasks assessed/performed Overall Cognitive Status: Within Functional Limits for tasks assessed                                          General Comments General comments (skin integrity, edema, etc.): VSS on  RA    Exercises     Assessment/Plan    PT Assessment Patient needs continued PT services  PT Problem List Decreased strength;Decreased activity tolerance;Decreased balance;Decreased mobility;Decreased knowledge of use of DME;Decreased knowledge of precautions;Impaired sensation       PT Treatment Interventions DME instruction;Stair training;Gait training;Functional mobility training;Therapeutic exercise;Therapeutic activities;Balance training;Neuromuscular re-education;Patient/family education;Wheelchair mobility training    PT Goals (Current goals can be found in the Care Plan section)  Acute Rehab PT Goals Patient Stated Goal: to go home for super bowl PT Goal Formulation: With patient Time For Goal Achievement: 05/31/22 Potential to Achieve Goals: Fair    Frequency Min 3X/week     Co-evaluation               AM-PAC PT "6 Clicks" Mobility  Outcome Measure Help needed turning from your back to your side while in a flat bed without using bedrails?: A Little Help needed moving from lying on your back to sitting on the side of a flat bed without using bedrails?: A Little Help needed moving to and from a bed to a chair (including a wheelchair)?: A Little Help needed standing up from a chair using your arms (e.g., wheelchair or bedside chair)?: A Little Help needed to walk in hospital room?: Total Help needed climbing 3-5 steps with a railing? : Total 6 Click Score: 14    End of Session   Activity Tolerance: Patient  tolerated treatment well Patient left: in chair;with call bell/phone within reach (no chair alarm box present, RN aware) Nurse Communication: Mobility status PT Visit Diagnosis: Other abnormalities of gait and mobility (R26.89);Muscle weakness (generalized) (M62.81);Difficulty in walking, not elsewhere classified (R26.2);Other symptoms and signs involving the nervous system (R29.898);Pain Pain - Right/Left: Right Pain - part of body: Ankle and joints of  foot    Time: 1435-1505 PT Time Calculation (min) (ACUTE ONLY): 30 min   Charges:   PT Evaluation $PT Eval Low Complexity: Bar[place], PT, DPT Acute Rehabilitation Office [place] 05/17/2022, 3:24 PM

## 2022-05-17 NOTE — Anesthesia Postprocedure Evaluation (Signed)
Anesthesia Post Note  Patient: [name]  Procedure(s) Performed: IRRIGATION AND DEBRIDEMENT RIGHT FOOT ABSCESS (Right)     Patient location during evaluation: PACU Anesthesia Type: General Level of consciousness: awake and alert Pain management: pain level controlled Vital Signs Assessment: post-procedure vital signs reviewed and stable Respiratory status: spontaneous breathing, nonlabored ventilation and respiratory function stable Cardiovascular status: stable and blood pressure returned to baseline Anesthetic complications: no   No notable events documented.  Last Vitals:  Vitals:   05/17/22 1115 05/17/22 1130  BP: 121/63 126/69  Pulse: 74 73  Resp: 19 19  Temp:    SpO2: 100% 99%    Last Pain:  Vitals:   05/17/22 1130  TempSrc:   PainSc: 0-No pain                 Audry Pili

## 2022-05-17 NOTE — Op Note (Signed)
05/17/2022  11:06 AM  PATIENT:  [name]    PRE-OPERATIVE DIAGNOSIS:  Abscess Right Foot  POST-OPERATIVE DIAGNOSIS:  Same  PROCEDURE:  IRRIGATION AND DEBRIDEMENT RIGHT FOOT ABSCESS Excision skin and soft tissue muscle and fascia. Local tissue rearrangement for wound closure 10 x 4 cm. Application of vancomycin powder 1 g. Application of Kerecis micro graft 38 cm.  SURGEON:  [name], MD  PHYSICIAN ASSISTANT:None ANESTHESIA:   General  PREOPERATIVE INDICATIONS:  [name] is a  72 y.o. male with a diagnosis of Abscess Right Foot who failed conservative measures and elected for surgical management.    The risks benefits and alternatives were discussed with the patient preoperatively including but not limited to the risks of infection, bleeding, nerve injury, cardiopulmonary complications, the need for revision surgery, among others, and the patient was willing to proceed.  OPERATIVE IMPLANTS: Vancomycin powder 1 g and Kerecis micro graft 38 cm.  @ENCIMAGES$ @  OPERATIVE FINDINGS: Abscess and necrotic tissue was resected tissue margins were clear.  OPERATIVE PROCEDURE: Patient was brought the operating room and underwent a general anesthetic.  After adequate levels anesthesia were obtained patient's right lower extremity was prepped using DuraPrep draped into a sterile field a timeout was called.  Elliptical incision was made around the ulcerative necrotic tissue.  This left a wound that was 10 x 4 cm.  The tissue was sent for cultures.  Tissue margins were clear.  Electrocautery was used hemostasis the wound was irrigated with normal saline.  The wound was filled with 1 g vancomycin powder this was covered with Kerecis micro graft 38 cm.  The wound edges were undermined and local tissue rearrangement was used to close the wound 10 x 4 cm.  Sterile dressing was applied patient was extubated taken the PACU in stable condition.   DISCHARGE PLANNING:  Antibiotic duration: IV  antibiotics while in the hospital will adjust antibiotics pending culture sensitivities.  Weightbearing: Touchdown weightbearing on the right patient may put full weightbearing on the heel for transfers  Pain medication: Opioid pathway  Dressing care/ Wound VAC: Dry dressing  Ambulatory devices: Walker  Discharge to: Anticipate discharge to home after observation.  Follow-up: In the office 1 week post operative.

## 2022-05-17 NOTE — TOC Initial Note (Signed)
Transition of Care [facility]) - Initial/Assessment Note    Patient Details  Name: [name] MRN: [id number] Date of Birth: June 04, 1950  Transition of [facility] CM/SW Contact:    Sharin Mons, RN Phone Number: 05/17/2022, 12:15 PM  Clinical Narrative:      Presents with R foot abscess. HD/ M/W/F. From home with wife. PTA independent with ADL's. Owns RW and W/C.     - s/p I and D of right foot abscess,2/9  PT evaluation pending ....  TOC team following and will assist with needs.  Expected Discharge Plan: Home/Self Care Barriers to Discharge: Continued Medical Work up   Patient Goals and CMS Choice            Expected Discharge Plan and Services   Discharge Planning Services: CM Consult                                          Prior Living Arrangements/Services     Patient language and need for interpreter reviewed:: Yes        Need for Family Participation in Patient Care: Yes (Comment) Care giver support system in place?: Yes (comment)   Criminal Activity/Legal Involvement Pertinent to Current Situation/Hospitalization: No - Comment as needed  Activities of Daily Living Home Assistive Devices/Equipment: Grab bars around toilet, Grab bars in shower, Walker (specify type), Wheelchair ADL Screening (condition at time of admission) Patient's cognitive ability adequate to safely complete daily activities?: Yes Is the patient deaf or have difficulty hearing?: No Does the patient have difficulty seeing, even when wearing glasses/contacts?: No Does the patient have difficulty concentrating, remembering, or making decisions?: No Patient able to express need for assistance with ADLs?: Yes Does the patient have difficulty dressing or bathing?: Yes Independently performs ADLs?: No Bathing: Needs assistance Does the patient have difficulty walking or climbing stairs?: Yes Weakness of Legs: Both Weakness of Arms/Hands: Both  Permission Sought/Granted                   Emotional Assessment         Alcohol / Substance Use: Not Applicable Psych Involvement: No (comment)  Admission diagnosis:  Abscess of right foot [L02.611] Patient Active Problem List   Diagnosis Date Noted   Abscess of right foot 05/17/2022   Contraindication to anticoagulation therapy GU Bleed 03/13/2022   Community acquired pneumonia of left lower lobe of lung 08/18/2021   Paroxysmal atrial fibrillation ([place]) 08/18/2021   Anemia of chronic renal failure 08/18/2021   Thrombocytopenia ([place]) 123456   Acute metabolic encephalopathy 123456   Action induced myoclonus 08/18/2021   Left below-knee amputee ([place]) 05/11/2021   Symptomatic anemia 09/13/2020   COVID-19 virus infection 09/13/2020   Pressure injury of skin 06/29/2020   Gross hematuria 06/16/2020   Typical atrial flutter ([place]) 03/24/2020   Bilateral pleural effusion 02/24/2020   Healthcare maintenance 01/28/2020   Shortness of breath 01/28/2020   History of partial ray amputation of fourth toe of right foot ([place]) 09/08/2019   Chronic cough 06/25/2019   Cutaneous abscess of right foot    AKI (acute kidney injury) ([place]) 12/14/2018   ESRD on hemodialysis ([place]) 12/14/2018   Gait abnormality 08/24/2018   Diabetic neuropathy ([place]) 02/06/2018   Cervical myelopathy ([place]) 02/06/2018   Onychomycosis 10/30/2017   Other spondylosis with radiculopathy, cervical region 01/27/2017   Midfoot ulcer, right, limited to breakdown of  skin ([place]) 11/15/2016   Lateral epicondylitis, left elbow 08/12/2016   Prostate cancer ([place]) 06/19/2016   Gout 09/14/2012   Hypertension associated with diabetes ([place]) 09/14/2012   Hypothyroidism 09/14/2012   CAD (coronary artery disease) 09/14/2012   Insulin dependent type 2 diabetes mellitus ([place]) 09/14/2012   Hyperlipidemia associated with type 2 diabetes mellitus ([place])    Neuropathy ([place])    PCP:  Lorenda Hatchet, FNP Pharmacy:   [id number] [address] [place]Phone: [phone]: [address]Phone: [phone]: [phone]    Social Determinants of Health (SDOH) Social History: SDOH Screenings   Food Insecurity: No Food Insecurity (05/17/2022)  Housing: Low Risk  (05/17/2022)  Transportation Needs: No Transportation Needs (05/17/2022)  Utilities: Not At Risk (05/17/2022)  Depression (PHQ2-9): Low Risk  (03/11/2022)  Tobacco Use: Medium Risk (05/17/2022)   SDOH Interventions:     Readmission Risk Interventions     No data to display

## 2022-05-17 NOTE — Interval H&P Note (Signed)
History and Physical Interval Note:  05/17/2022 8:51 AM  [name]  has presented today for surgery, with the diagnosis of Abscess Right Foot.  The various methods of treatment have been discussed with the patient and family. After consideration of risks, benefits and other options for treatment, the patient has consented to  Procedure(s): IRRIGATION AND DEBRIDEMENT RIGHT FOOT ABSCESS (Right) as a surgical intervention.  The patient's history has been reviewed, patient examined, no change in status, stable for surgery.  I have reviewed the patient's chart and labs.  Questions were answered to the patient's satisfaction.     [name]

## 2022-05-17 NOTE — Anesthesia Procedure Notes (Signed)
Procedure Name: Intubation Date/Time: 05/17/2022 10:42 AM  Performed by: Lieutenant Diego, CRNAPre-anesthesia Checklist: Patient identified, Emergency Drugs available, Suction available and Patient being monitored Patient Re-evaluated:Patient Re-evaluated prior to induction Oxygen Delivery Method: Circle system utilized Preoxygenation: Pre-oxygenation with 100% oxygen Induction Type: IV induction Ventilation: Mask ventilation without difficulty Laryngoscope Size: Miller and 3 Grade View: Grade I Tube type: Oral Tube size: 7.5 mm Number of attempts: 1 Airway Equipment and Method: Stylet and Oral airway Placement Confirmation: ETT inserted through vocal cords under direct vision, positive ETCO2 and breath sounds checked- equal and bilateral Secured at: 23 cm Tube secured with: Tape Dental Injury: Teeth and Oropharynx as per pre-operative assessment

## 2022-05-18 DIAGNOSIS — L02611 Cutaneous abscess of right foot: Secondary | ICD-10-CM | POA: Diagnosis not present

## 2022-05-18 LAB — GLUCOSE, CAPILLARY: Glucose-Capillary: 141 mg/dL — ABNORMAL HIGH (ref 70–99)

## 2022-05-18 MED ORDER — SULFAMETHOXAZOLE-TRIMETHOPRIM 800-160 MG PO TABS: 1.0000 | ORAL_TABLET | Freq: Two times a day (BID) | ORAL | 0 refills | Status: DC

## 2022-05-18 NOTE — Progress Notes (Signed)
Patient ID: [name], male   DOB: 1950/12/09, 72 y.o.   MRN: [id number] Patient is postoperative day 1 debridement acute abscess plantar aspect right foot.  Tissue cultures are showing gram-positive cocci and gram-negative rods.  The dressing was changed there is no active bleeding Mepilex dressing was applied so patient can wear his shoes and braces for ambulation.  Discussed the importance of minimizing weightbearing.  Patient wishes to discharge to home today.  Will write a prescription for Bactrim DS.

## 2022-05-18 NOTE — Discharge Summary (Signed)
Discharge Diagnoses:  Principal Problem:   Abscess of right foot Active Problems:   Cutaneous abscess of right foot   Surgeries: Procedure(s): IRRIGATION AND DEBRIDEMENT RIGHT FOOT ABSCESS on 05/17/2022    Consultants:   Discharged Condition: Improved  Hospital Course: [name] is an 72 y.o. male who was admitted 05/17/2022 with a chief complaint of acute abscess right plantar foot, with a final diagnosis of Abscess Right Foot.  Patient was brought to the operating room on 05/17/2022 and underwent Procedure(s): IRRIGATION AND DEBRIDEMENT RIGHT FOOT ABSCESS.    Patient was given perioperative antibiotics:  Anti-infectives (From admission, onward)    Start     Dose/Rate Route Frequency Ordered Stop   05/18/22 0000  sulfamethoxazole-trimethoprim (BACTRIM DS) 800-160 MG tablet        1 tablet Oral 2 times daily 05/18/22 0733     05/17/22 1600  ceFAZolin (ANCEF) IVPB 1 g/50 mL premix        1 g 100 mL/hr over 30 Minutes Intravenous Every 6 hours 05/17/22 1201 05/18/22 0604   05/17/22 1055  vancomycin (VANCOCIN) powder  Status:  Discontinued          As needed 05/17/22 1055 05/17/22 1109   05/17/22 0815  ceFAZolin (ANCEF) IVPB 2g/100 mL premix        2 g 200 mL/hr over 30 Minutes Intravenous On call to O.R. 05/17/22 0806 05/17/22 1040   05/17/22 0809  ceFAZolin (ANCEF) 2-4 GM/100ML-% IVPB       Note to Pharmacy: Dallie Piles, Destiny: cabinet override      05/17/22 0809 05/17/22 1049     .  Patient was given sequential compression devices, early ambulation, and aspirin for DVT prophylaxis.  Recent vital signs: Patient Vitals for the past 24 hrs:  BP Temp Temp src Pulse Resp SpO2 Height Weight  05/18/22 0343 (!) 151/74 (!) 97.4 F (36.3 C) Oral 74 19 99 % -- --  05/17/22 1949 (!) 144/68 98.1 F (36.7 C) Oral 64 16 100 % -- --  05/17/22 1207 136/63 97.9 F (36.6 C) -- 63 19 97 % -- --  05/17/22 1130 126/69 -- -- 73 19 99 % -- --  05/17/22 1115 121/63 -- -- 74 19 100 % -- --   05/17/22 1105 (!) 141/63 97.8 F (36.6 C) -- 71 15 98 % -- --  05/17/22 0857 (!) 110/46 -- -- (!) 54 -- -- -- --  05/17/22 0747 (!) 119/53 98.4 F (36.9 C) Oral 62 18 100 % 5' 10"$  (1.778 m) 86.2 kg  .  Recent laboratory studies: No results found.  Discharge Medications:   Allergies as of 05/18/2022       Reactions   Morphine    Other reaction(s): Delusions (intolerance)   Mushroom Extract Complex Nausea Only        Medication List     STOP taking these medications    doxycycline 100 MG tablet Commonly known as: VIBRA-TABS       TAKE these medications    acetaminophen 500 MG tablet Commonly known as: TYLENOL Take 1,000 mg by mouth every 8 (eight) hours as needed for mild pain or headache.   allopurinol 100 MG tablet Commonly known as: ZYLOPRIM Take 2 tablets (200 mg total) by mouth daily.   carvedilol 12.5 MG tablet Commonly known as: COREG TAKE 1 TABLET BY MOUTH IN THE MORNING AND AT BEDTIME.   Darbepoetin Alfa 100 MCG/0.5ML Sosy injection Commonly known as: ARANESP Inject 0.5 mLs (100 mcg total)  into the vein every Thursday with hemodialysis.   DIALYVITE 800 WITH ZINC 0.8 MG Tabs Take 1 tablet by mouth daily.   levothyroxine 112 MCG tablet Commonly known as: SYNTHROID Take 1 tablet (112 mcg total) by mouth daily before breakfast.   losartan 50 MG tablet Commonly known as: COZAAR Take 50 mg by mouth 2 (two) times daily.   Lubricant Eye Drops 0.4-0.3 % Soln Generic drug: Polyethyl Glycol-Propyl Glycol Place 1-2 drops into both eyes in the morning and at bedtime.   methocarbamol 500 MG tablet Commonly known as: ROBAXIN Take 1 tablet (500 mg total) by mouth 3 (three) times daily.   MIRCERA IJ Inject into the skin.   modafinil 200 MG tablet Commonly known as: PROVIGIL Take 1 tablet (200 mg total) by mouth daily.   Ozempic (0.25 or 0.5 MG/DOSE) 2 MG/3ML Sopn Generic drug: Semaglutide(0.25 or 0.5MG/DOS) Inject 0.5 mg into the skin once a week.    pantoprazole 40 MG tablet Commonly known as: PROTONIX Take 1 tablet (40 mg total) by mouth daily. What changed: when to take this   Pentips 32G X 4 MM Misc Generic drug: Insulin Pen Needle Use to inject insulin up to 4 times daily as needed.   pregabalin 150 MG capsule Commonly known as: LYRICA Take 1 capsule by mouth in the morning and 2 capsules at night. What changed:  how much to take how to take this when to take this additional instructions   prochlorperazine 5 MG tablet Commonly known as: COMPAZINE TAKE 1 TABLET BY MOUTH TWICE A DAY   rosuvastatin 10 MG tablet Commonly known as: CRESTOR Take 1 tablet (10 mg total) by mouth daily.   sevelamer carbonate 800 MG tablet Commonly known as: RENVELA Take 1 tablet (800 mg total) by mouth 3 (three) times daily with meals. What changed: when to take this   sulfamethoxazole-trimethoprim 800-160 MG tablet Commonly known as: BACTRIM DS Take 1 tablet by mouth 2 (two) times daily.   Vitamin D (Ergocalciferol) 1.25 MG (50000 UNIT) Caps capsule Commonly known as: DRISDOL Take 1 capsule (50,000 Units total) by mouth every 7 (seven) days.        Diagnostic Studies: No results found.  Patient benefited maximally from their hospital stay and there were no complications.     Disposition: Discharge disposition: 01-Home or Self Care      Discharge Instructions     Call MD / Call 911   Complete by: As directed    If you experience chest pain or shortness of breath, CALL 911 and be transported to the hospital emergency room.  If you develope a fever above 101 F, pus (white drainage) or increased drainage or redness at the wound, or calf pain, call your surgeon's office.   Call MD / Call 911   Complete by: As directed    If you experience chest pain or shortness of breath, CALL 911 and be transported to the hospital emergency room.  If you develope a fever above 101 F, pus (white drainage) or increased drainage or redness at  the wound, or calf pain, call your surgeon's office.   Constipation Prevention   Complete by: As directed    Drink plenty of fluids.  Prune juice may be helpful.  You may use a stool softener, such as Colace (over the counter) 100 mg twice a day.  Use MiraLax (over the counter) for constipation as needed.   Constipation Prevention   Complete by: As directed    Drink  plenty of fluids.  Prune juice may be helpful.  You may use a stool softener, such as Colace (over the counter) 100 mg twice a day.  Use MiraLax (over the counter) for constipation as needed.   Diet - low sodium heart healthy   Complete by: As directed    Diet - low sodium heart healthy   Complete by: As directed    Increase activity slowly as tolerated   Complete by: As directed    Increase activity slowly as tolerated   Complete by: As directed    Post-operative opioid taper instructions:   Complete by: As directed    POST-OPERATIVE OPIOID TAPER INSTRUCTIONS: It is important to wean off of your opioid medication as soon as possible. If you do not need pain medication after your surgery it is ok to stop day one. Opioids include: Codeine, Hydrocodone(Norco, Vicodin), Oxycodone(Percocet, oxycontin) and hydromorphone amongst others.  Long term and even short term use of opiods can cause: Increased pain response Dependence Constipation Depression Respiratory depression And more.  Withdrawal symptoms can include Flu like symptoms Nausea, vomiting And more Techniques to manage these symptoms Hydrate well Eat regular healthy meals Stay active Use relaxation techniques(deep breathing, meditating, yoga) Do Not substitute Alcohol to help with tapering If you have been on opioids for less than two weeks and do not have pain than it is ok to stop all together.  Plan to wean off of opioids This plan should start within one week post op of your joint replacement. Maintain the same interval or time between taking each dose and  first decrease the dose.  Cut the total daily intake of opioids by one tablet each day Next start to increase the time between doses. The last dose that should be eliminated is the evening dose.      Post-operative opioid taper instructions:   Complete by: As directed    POST-OPERATIVE OPIOID TAPER INSTRUCTIONS: It is important to wean off of your opioid medication as soon as possible. If you do not need pain medication after your surgery it is ok to stop day one. Opioids include: Codeine, Hydrocodone(Norco, Vicodin), Oxycodone(Percocet, oxycontin) and hydromorphone amongst others.  Long term and even short term use of opiods can cause: Increased pain response Dependence Constipation Depression Respiratory depression And more.  Withdrawal symptoms can include Flu like symptoms Nausea, vomiting And more Techniques to manage these symptoms Hydrate well Eat regular healthy meals Stay active Use relaxation techniques(deep breathing, meditating, yoga) Do Not substitute Alcohol to help with tapering If you have been on opioids for less than two weeks and do not have pain than it is ok to stop all together.  Plan to wean off of opioids This plan should start within one week post op of your joint replacement. Maintain the same interval or time between taking each dose and first decrease the dose.  Cut the total daily intake of opioids by one tablet each day Next start to increase the time between doses. The last dose that should be eliminated is the evening dose.          Follow-up Information     [name], MD Follow up in 1 week(s).   Specialty: Orthopedic Surgery Contact information: [address] [place] [address] [phone]                  Signed: [name] 05/18/2022, 7:33 AM

## 2022-05-18 NOTE — Plan of Care (Signed)

## 2022-05-20 ENCOUNTER — Encounter (HOSPITAL_COMMUNITY): Payer: Self-pay | Admitting: Orthopedic Surgery

## 2022-05-20 ENCOUNTER — Telehealth: Payer: Self-pay

## 2022-05-20 ENCOUNTER — Other Ambulatory Visit: Payer: Self-pay | Admitting: Orthopedic Surgery

## 2022-05-20 MED ORDER — AMOXICILLIN-POT CLAVULANATE 250-125 MG PO TABS: 1.0000 | ORAL_TABLET | Freq: Two times a day (BID) | ORAL | 0 refills | Status: DC

## 2022-05-20 NOTE — Telephone Encounter (Signed)
-----   Message from [name], MD sent at 05/20/2022  7:49 AM EST ----- Prescription called in for Augmentin.  Dose adjusted for peritoneal dialysis.  He may stop the Bactrim DS.  Cultures are sensitive to the Augmentin not Bactrim DS. ----- Message ----- From: Buel Ream, [facility] [place] Sent: 05/18/2022  10:53 AM EST To: [name], MD

## 2022-05-20 NOTE — Telephone Encounter (Signed)
I called and sw pt to advise of message below. To call with any questions.

## 2022-05-21 LAB — AEROBIC/ANAEROBIC CULTURE W GRAM STAIN (SURGICAL/DEEP WOUND)

## 2022-05-23 ENCOUNTER — Ambulatory Visit (INDEPENDENT_AMBULATORY_CARE_PROVIDER_SITE_OTHER): Payer: Medicare Other | Admitting: Orthopedic Surgery

## 2022-05-23 DIAGNOSIS — S88112A Complete traumatic amputation at level between knee and ankle, left lower leg, initial encounter: Secondary | ICD-10-CM

## 2022-05-23 DIAGNOSIS — L02611 Cutaneous abscess of right foot: Secondary | ICD-10-CM

## 2022-05-23 DIAGNOSIS — L97411 Non-pressure chronic ulcer of right heel and midfoot limited to breakdown of skin: Secondary | ICD-10-CM

## 2022-05-23 DIAGNOSIS — Z89512 Acquired absence of left leg below knee: Secondary | ICD-10-CM

## 2022-05-24 ENCOUNTER — Encounter: Payer: Self-pay | Admitting: Orthopedic Surgery

## 2022-05-24 NOTE — Progress Notes (Signed)
Office Visit Note   Patient: [name]           Date of Birth: 1950/11/15           MRN: [id number] Visit Date: 05/23/2022              Requested by: Lorenda Hatchet, FNP [id number] [place],  [place] [address] PCP: Lorenda Hatchet, FNP  Chief Complaint  Patient presents with   Right Foot - Routine Post Op      HPI: Patient is a 72 year old gentleman who presents 1 week status post debridement abscess plantar aspect of the right foot.  Patient is currently on Augmentin.  Assessment & Plan: Visit Diagnoses:  1. Midfoot ulcer, right, limited to breakdown of skin ([place])   2. Cutaneous abscess of right foot   3. Below-knee amputation of left lower extremity ([place])     Plan: Will continue with dry dressing changes patient may shower minimize weightbearing on the right foot continue his antibiotics.  Follow-Up Instructions: Return in about 1 week (around 05/30/2022).   Ortho Exam  Patient is alert, oriented, no adenopathy, well-dressed, normal affect, normal respiratory effort. Patient's wound edges are well-approximated.  There is no cellulitis.  Cultures shows 3 separate bacteria including Proteus Enterococcus and staph.  Patient is currently on Augmentin as per recommendations from pharmacy.  Imaging: No results found. No images are attached to the encounter.  Labs: Lab Results  Component Value Date   HGBA1C 5.0 05/17/2022   HGBA1C 5.5 05/04/2021   HGBA1C 6.3 (H) 02/02/2021   ESRSEDRATE 34 (H) 01/24/2020   ESRSEDRATE 41 (H) 01/10/2020   ESRSEDRATE 55 (H) 12/27/2019   CRP 8.8 (H) 01/24/2020   CRP 35.9 (H) 01/10/2020   CRP 77.9 (H) 12/27/2019   REPTSTATUS 05/21/2022 FINAL 05/17/2022   GRAMSTAIN  05/17/2022    ABUNDANT WBC PRESENT, PREDOMINANTLY PMN ABUNDANT GRAM POSITIVE COCCI ABUNDANT GRAM NEGATIVE RODS    CULT  05/17/2022    MODERATE PROTEUS MIRABILIS FEW ENTEROCOCCUS FAECALIS FEW STAPHYLOCOCCUS AUREUS ABUNDANT BACTEROIDES SPECIES NOT  FRAGILIS BETA LACTAMASE POSITIVE Performed at [facility], [place] [address], [place], [place] [address]    [place] 05/17/2022   LABORGA ENTEROCOCCUS FAECALIS 05/17/2022   LABORGA STAPHYLOCOCCUS AUREUS 05/17/2022     Lab Results  Component Value Date   ALBUMIN 4.1 12/20/2021   ALBUMIN 3.3 (L) 08/20/2021   ALBUMIN 3.7 08/19/2021   PREALBUMIN 32.7 05/09/2021   PREALBUMIN 14.8 (L) 12/14/2018    Lab Results  Component Value Date   MG 2.4 05/14/2021   MG 1.9 05/09/2021   MG 2.0 06/28/2020   Lab Results  Component Value Date   VD25OH 42.0 11/19/2021   VD25OH 16.13 (L) 05/09/2021    Lab Results  Component Value Date   PREALBUMIN 32.7 05/09/2021   PREALBUMIN 14.8 (L) 12/14/2018      Latest Ref Rng & Units 05/17/2022    9:11 AM 12/20/2021    3:33 PM 08/20/2021    1:44 AM  CBC EXTENDED  WBC 4.0 - 10.5 K/uL  4.8  4.7   RBC 4.22 - 5.81 Mil/uL  3.62  3.18   Hemoglobin 13.0 - 17.0 g/dL 8.5  10.1  9.0   HCT 39.0 - 52.0 % 25.0  30.0  27.8   Platelets 150.0 - 400.0 K/uL  77.0  82   NEUT# 1.4 - 7.7 K/uL  3.9    Lymph# 0.7 - 4.0 K/uL  0.3  There is no height or weight on file to calculate BMI.  Orders:  No orders of the defined types were placed in this encounter.  No orders of the defined types were placed in this encounter.    Procedures: No procedures performed  Clinical Data: No additional findings.  ROS:  All other systems negative, except as noted in the HPI. Review of Systems  Objective: Vital Signs: There were no vitals taken for this visit.  Specialty Comments:  No specialty comments available.  PMFS History: Patient Active Problem List   Diagnosis Date Noted   Abscess of right foot 05/17/2022   Contraindication to anticoagulation therapy GU Bleed 03/13/2022   Community acquired pneumonia of left lower lobe of lung 08/18/2021   Paroxysmal atrial fibrillation ([place]) 08/18/2021   Anemia of chronic renal failure 08/18/2021    Thrombocytopenia ([place]) 123456   Acute metabolic encephalopathy 123456   Action induced myoclonus 08/18/2021   Left below-knee amputee ([place]) 05/11/2021   Symptomatic anemia 09/13/2020   COVID-19 virus infection 09/13/2020   Pressure injury of skin 06/29/2020   Gross hematuria 06/16/2020   Typical atrial flutter ([place]) 03/24/2020   Bilateral pleural effusion 02/24/2020   Healthcare maintenance 01/28/2020   Shortness of breath 01/28/2020   History of partial ray amputation of fourth toe of right foot ([place]) 09/08/2019   Chronic cough 06/25/2019   Cutaneous abscess of right foot    AKI (acute kidney injury) ([place]) 12/14/2018   ESRD on hemodialysis ([place]) 12/14/2018   Gait abnormality 08/24/2018   Diabetic neuropathy ([place]) 02/06/2018   Cervical myelopathy ([place]) 02/06/2018   Onychomycosis 10/30/2017   Other spondylosis with radiculopathy, cervical region 01/27/2017   Midfoot ulcer, right, limited to breakdown of skin ([place]) 11/15/2016   Lateral epicondylitis, left elbow 08/12/2016   Prostate cancer ([place]) 06/19/2016   Gout 09/14/2012   Hypertension associated with diabetes ([place]) 09/14/2012   Hypothyroidism 09/14/2012   CAD (coronary artery disease) 09/14/2012   Insulin dependent type 2 diabetes mellitus ([place]) 09/14/2012   Hyperlipidemia associated with type 2 diabetes mellitus ([place])    Neuropathy (HCC)    Past Medical History:  Diagnosis Date   Acute osteomyelitis of metatarsal bone of left foot ([place])    Ambulates with cane    straight cane   Anemia    Cervical myelopathy ([place]) 02/06/2018   Chronic kidney disease    dailysis [facility] F- home   Complication of anesthesia    Coronary artery disease    Dehiscence of amputation stump of left lower extremity ([place])    Diabetes mellitus without complication ([place])    type2   Diabetic foot ulcer ([name])    Diabetic neuropathy ([place]) 02/06/2018   Dysrhythmia    Gait abnormality 08/24/2018   GERD (gastroesophageal reflux disease)      01/06/20- not current   Gout    History of blood transfusion    History of blood transfusion    History of kidney stones    passed stones   Hypercholesteremia    Hypertension    Hypothyroidism    Neuromuscular disorder (HCC)    neuropathy left leg and bilateral feet   Neuropathy    Partial nontraumatic amputation of foot, left ([place]) 02/20/2021   PONV (postoperative nausea and vomiting)    Prostate cancer ([place])    PVD (peripheral vascular disease) ([place])    with amputations    Family History  Problem Relation Age of Onset   Diabetes Mellitus II Mother    Kidney  disease Mother    Diabetes Mellitus II Father    CAD Father    Cancer Father        prostate   Kidney disease Father    Diabetes Mellitus II Brother    Kidney disease Brother    Diabetes Mellitus II Brother    Stomach cancer Brother 83   Kidney disease Brother    Colon cancer Neg Hx    Colon polyps Neg Hx    Esophageal cancer Neg Hx    Rectal cancer Neg Hx    Pancreatic cancer Neg Hx     Past Surgical History:  Procedure Laterality Date   A-FLUTTER ABLATION N/A 04/06/2020   Procedure: A-FLUTTER ABLATION;  Surgeon: [name], MD;  Location: [facility];  Service: Cardiovascular;  Laterality: N/A;   AMPUTATION Left 12/25/2013   Procedure: AMPUTATION RAY LEFT 5TH RAY;  Surgeon: [name], MD;  Location: [facility];  Service: Orthopedics;  Laterality: Left;   AMPUTATION Right 12/15/2018   Procedure: AMPUTATION OF 4TH AND 5TH TOES RIGHT FOOT;  Surgeon: [name], MD;  Location: [facility];  Service: Orthopedics;  Laterality: Right;   AMPUTATION Left 02/02/2021   Procedure: LEFT FOOT 4TH RAY AMPUTATION;  Surgeon: [name], MD;  Location: [facility];  Service: Orthopedics;  Laterality: Left;   AMPUTATION Left 05/09/2021   Procedure: LEFT BELOW KNEE AMPUTATION;  Surgeon: [name], MD;  Location: [facility];  Service: Orthopedics;  Laterality: Left;   APPLICATION OF WOUND VAC Right 12/15/2018   Procedure:  APPLICATION OF WOUND VAC;  Surgeon: [name], MD;  Location: [facility];  Service: Orthopedics;  Laterality: Right;   AV FISTULA PLACEMENT Left 03/25/2019   Procedure: LEFT ARM ARTERIOVENOUS (AV) FISTULA CREATION;  Surgeon: [name], MD;  Location: [facility];  Service: Vascular;  Laterality: Left;   [place] Left 05/20/2019   Procedure: SECOND STAGE LEFT BASCILIC VEIN TRANSPOSITION;  Surgeon: [name], MD;  Location: [facility];  Service: Vascular;  Laterality: Left;   CARDIAC CATHETERIZATION  02/17/2014   CHOLECYSTECTOMY     COLONOSCOPY  2011   in [place], [place]  2008   CYSTOSCOPY N/A 06/29/2020   Procedure: [place];  Surgeon: [name], MD;  Location: [facility];  Service: Urology;  Laterality: N/A;   CYSTOSCOPY WITH FULGERATION Bilateral 06/17/2020   Procedure: CYSTOSCOPY,BILATERAL RETROGRADE, CLOT EVACUATION WITH FULGERATION OF THE BLADDER;  Surgeon: Janith Lima, MD;  Location: [facility];  Service: Urology;  Laterality: Bilateral;   CYSTOSCOPY WITH FULGERATION N/A 06/28/2020   Procedure: CYSTOSCOPY WITH CLOT EVACUATION AND FULGERATION OF BLEEDERS;  Surgeon: [name], MD;  Location: [facility];  Service: Urology;  Laterality: N/A;  1 HR   I & D EXTREMITY Right 12/15/2018   Procedure: DEBRIDEMENT RIGHT FOOT;  Surgeon: [name], MD;  Location: [facility];  Service: Orthopedics;  Laterality: Right;   I & D EXTREMITY Right 08/27/2019   Procedure: PARTIAL CUBOID EXCISION RIGHT FOOT;  Surgeon: [name], MD;  Location: [facility];  Service: Orthopedics;  Laterality: Right;   I & D EXTREMITY Right 01/07/2020   Procedure: RIGHT FOOT EXCISION INFECTED BONE;  Surgeon: [name], MD;  Location: [facility];  Service: Orthopedics;  Laterality: Right;   I & D EXTREMITY Right 05/17/2022   Procedure: IRRIGATION AND DEBRIDEMENT RIGHT FOOT  ABSCESS;  Surgeon: [name], MD;  Location:  [facility];  Service: Orthopedics;  Laterality: Right;   NECK SURGERY     novemver 2019   STUMP REVISION Left 06/27/2021   Procedure: REVISION LEFT BELOW KNEE AMPUTATION;  Surgeon: [name], MD;  Location: [facility];  Service: Orthopedics;  Laterality: Left;   TRANSURETHRAL RESECTION OF PROSTATE N/A 06/28/2020   Procedure: TRANSURETHRAL RESECTION OF THE PROSTATE (TURP);  Surgeon: [name], MD;  Location: [facility];  Service: Urology;  Laterality: N/A;   WISDOM TOOTH EXTRACTION     Social History   Occupational History   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

## 2022-06-03 ENCOUNTER — Ambulatory Visit (INDEPENDENT_AMBULATORY_CARE_PROVIDER_SITE_OTHER): Payer: Medicare Other | Admitting: Orthopedic Surgery

## 2022-06-03 DIAGNOSIS — L97411 Non-pressure chronic ulcer of right heel and midfoot limited to breakdown of skin: Secondary | ICD-10-CM

## 2022-06-04 ENCOUNTER — Encounter: Payer: Self-pay | Admitting: Orthopedic Surgery

## 2022-06-04 ENCOUNTER — Other Ambulatory Visit: Payer: Self-pay | Admitting: Internal Medicine

## 2022-06-04 NOTE — Progress Notes (Signed)
Office Visit Note   Patient: [name]           Date of Birth: June 10, 1950           MRN: [id number] Visit Date: 06/03/2022              Requested by: Lorenda Hatchet, FNP [id number] [place],  [place] [address] PCP: Lorenda Hatchet, FNP  Chief Complaint  Patient presents with   Right Foot - Routine Post Op    05/17/22 I&D right foot abscess       HPI: Patient is a 72 year old gentleman who is 2 weeks status post irrigation debridement abscess plantar aspect of the right foot.  Patient is completing his course of Augmentin.  Assessment & Plan: Visit Diagnoses:  1. Midfoot ulcer, right, limited to breakdown of skin ([place])     Plan: Continue minimize weightbearing uses wheelchair and complete the antibiotics Dial soap cleansing and dry dressing change we will give him Mepilex dressing.  Remove sutures at follow-up.  Follow-Up Instructions: Return in about 2 weeks (around 06/17/2022).   Ortho Exam  Patient is alert, oriented, no adenopathy, well-dressed, normal affect, normal respiratory effort. Examination the wound is gaped open 2 cm.  There is healthy granulation tissue at the base there is no cellulitis no signs of infection there is slight maceration.  The lateral foot ulcer is stable.  Plantar is 5 x 1 cm and the lateral foot ulcer is 2 cm in diameter.  Imaging: No results found.   Labs: Lab Results  Component Value Date   HGBA1C 5.0 05/17/2022   HGBA1C 5.5 05/04/2021   HGBA1C 6.3 (H) 02/02/2021   ESRSEDRATE 34 (H) 01/24/2020   ESRSEDRATE 41 (H) 01/10/2020   ESRSEDRATE 55 (H) 12/27/2019   CRP 8.8 (H) 01/24/2020   CRP 35.9 (H) 01/10/2020   CRP 77.9 (H) 12/27/2019   REPTSTATUS 05/21/2022 FINAL 05/17/2022   GRAMSTAIN  05/17/2022    ABUNDANT WBC PRESENT, PREDOMINANTLY PMN ABUNDANT GRAM POSITIVE COCCI ABUNDANT GRAM NEGATIVE RODS    CULT  05/17/2022    MODERATE PROTEUS MIRABILIS FEW ENTEROCOCCUS FAECALIS FEW STAPHYLOCOCCUS AUREUS ABUNDANT  BACTEROIDES SPECIES NOT FRAGILIS BETA LACTAMASE POSITIVE Performed at [facility], [place] [address], [place], [place] [address]    [place] 05/17/2022   LABORGA ENTEROCOCCUS FAECALIS 05/17/2022   LABORGA STAPHYLOCOCCUS AUREUS 05/17/2022     Lab Results  Component Value Date   ALBUMIN 4.1 12/20/2021   ALBUMIN 3.3 (L) 08/20/2021   ALBUMIN 3.7 08/19/2021   PREALBUMIN 32.7 05/09/2021   PREALBUMIN 14.8 (L) 12/14/2018    Lab Results  Component Value Date   MG 2.4 05/14/2021   MG 1.9 05/09/2021   MG 2.0 06/28/2020   Lab Results  Component Value Date   VD25OH 42.0 11/19/2021   VD25OH 16.13 (L) 05/09/2021    Lab Results  Component Value Date   PREALBUMIN 32.7 05/09/2021   PREALBUMIN 14.8 (L) 12/14/2018      Latest Ref Rng & Units 05/17/2022    9:11 AM 12/20/2021    3:33 PM 08/20/2021    1:44 AM  CBC EXTENDED  WBC 4.0 - 10.5 K/uL  4.8  4.7   RBC 4.22 - 5.81 Mil/uL  3.62  3.18   Hemoglobin 13.0 - 17.0 g/dL 8.5  10.1  9.0   HCT 39.0 - 52.0 % 25.0  30.0  27.8   Platelets 150.0 - 400.0 K/uL  77.0  82  NEUT# 1.4 - 7.7 K/uL  3.9    Lymph# 0.7 - 4.0 K/uL  0.3       There is no height or weight on file to calculate BMI.  Orders:  No orders of the defined types were placed in this encounter.  No orders of the defined types were placed in this encounter.    Procedures: No procedures performed  Clinical Data: No additional findings.  ROS:  All other systems negative, except as noted in the HPI. Review of Systems  Objective: Vital Signs: There were no vitals taken for this visit.  Specialty Comments:  No specialty comments available.  PMFS History: Patient Active Problem List   Diagnosis Date Noted   Abscess of right foot 05/17/2022   Contraindication to anticoagulation therapy GU Bleed 03/13/2022   Community acquired pneumonia of left lower lobe of lung 08/18/2021   Paroxysmal atrial fibrillation ([place]) 08/18/2021   Anemia of chronic  renal failure 08/18/2021   Thrombocytopenia ([place]) 123456   Acute metabolic encephalopathy 123456   Action induced myoclonus 08/18/2021   Left below-knee amputee ([place]) 05/11/2021   Symptomatic anemia 09/13/2020   COVID-19 virus infection 09/13/2020   Pressure injury of skin 06/29/2020   Gross hematuria 06/16/2020   Typical atrial flutter ([place]) 03/24/2020   Bilateral pleural effusion 02/24/2020   Healthcare maintenance 01/28/2020   Shortness of breath 01/28/2020   History of partial ray amputation of fourth toe of right foot ([place]) 09/08/2019   Chronic cough 06/25/2019   Cutaneous abscess of right foot    AKI (acute kidney injury) ([place]) 12/14/2018   ESRD on hemodialysis ([place]) 12/14/2018   Gait abnormality 08/24/2018   Diabetic neuropathy ([place]) 02/06/2018   Cervical myelopathy ([place]) 02/06/2018   Onychomycosis 10/30/2017   Other spondylosis with radiculopathy, cervical region 01/27/2017   Midfoot ulcer, right, limited to breakdown of skin ([place]) 11/15/2016   Lateral epicondylitis, left elbow 08/12/2016   Prostate cancer ([place]) 06/19/2016   Gout 09/14/2012   Hypertension associated with diabetes ([place]) 09/14/2012   Hypothyroidism 09/14/2012   CAD (coronary artery disease) 09/14/2012   Insulin dependent type 2 diabetes mellitus ([place]) 09/14/2012   Hyperlipidemia associated with type 2 diabetes mellitus ([place])    Neuropathy (HCC)    Past Medical History:  Diagnosis Date   Acute osteomyelitis of metatarsal bone of left foot ([place])    Ambulates with cane    straight cane   Anemia    Cervical myelopathy ([place]) 02/06/2018   Chronic kidney disease    dailysis M W F- home   Complication of anesthesia    Coronary artery disease    Dehiscence of amputation stump of left lower extremity ([place])    Diabetes mellitus without complication ([place])    type2   Diabetic foot ulcer ([place])    Diabetic neuropathy ([place]) 02/06/2018   Dysrhythmia    Gait abnormality 08/24/2018   GERD  (gastroesophageal reflux disease)     01/06/20- not current   Gout    History of blood transfusion    History of blood transfusion    History of kidney stones    passed stones   Hypercholesteremia    Hypertension    Hypothyroidism    Neuromuscular disorder ([place])    neuropathy left leg and bilateral feet   Neuropathy    Partial nontraumatic amputation of foot, left ([place]) 02/20/2021   PONV (postoperative nausea and vomiting)    Prostate cancer ([place])    PVD (peripheral vascular disease) ([place])  with amputations    Family History  Problem Relation Age of Onset   Diabetes Mellitus II Mother    Kidney disease Mother    Diabetes Mellitus II Father    CAD Father    Cancer Father        prostate   Kidney disease Father    Diabetes Mellitus II Brother    Kidney disease Brother    Diabetes Mellitus II Brother    Stomach cancer Brother 97   Kidney disease Brother    Colon cancer Neg Hx    Colon polyps Neg Hx    Esophageal cancer Neg Hx    Rectal cancer Neg Hx    Pancreatic cancer Neg Hx     Past Surgical History:  Procedure Laterality Date   A-FLUTTER ABLATION N/A 04/06/2020   Procedure: A-FLUTTER ABLATION;  Surgeon: [name], MD;  Location: [facility];  Service: Cardiovascular;  Laterality: N/A;   AMPUTATION Left 12/25/2013   Procedure: AMPUTATION RAY LEFT 5TH RAY;  Surgeon: [name], MD;  [facility];  Service: Orthopedics;  Laterality: Left;   AMPUTATION Right 12/15/2018   Procedure: AMPUTATION OF 4TH AND 5TH TOES RIGHT FOOT;  Surgeon: [name], MD;  Location: [facility];  Service: Orthopedics;  Laterality: Right;   AMPUTATION Left 02/02/2021   Procedure: LEFT FOOT 4TH RAY AMPUTATION;  Surgeon: [name], MD;  Location: [facility];  Service: Orthopedics;  Laterality: Left;   AMPUTATION Left 05/09/2021   Procedure: LEFT BELOW KNEE AMPUTATION;  Surgeon: [name], MD;  Location: [facility];  Service: Orthopedics;  Laterality: Left;   APPLICATION OF WOUND  VAC Right 12/15/2018   Procedure: APPLICATION OF WOUND VAC;  Surgeon: [name], MD;  Location: [facility];  Service: Orthopedics;  Laterality: Right;   AV FISTULA PLACEMENT Left 03/25/2019   Procedure: LEFT ARM ARTERIOVENOUS (AV) FISTULA CREATION;  Surgeon: [name], MD;  Location: [facility];  Service: Vascular;  Laterality: Left;   [place] Left 05/20/2019   Procedure: SECOND STAGE LEFT BASCILIC VEIN TRANSPOSITION;  Surgeon: [name], MD;  Location: [facility];  Service: Vascular;  Laterality: Left;   CARDIAC CATHETERIZATION  02/17/2014   CHOLECYSTECTOMY     COLONOSCOPY  2011   in [place], [place]  2008   CYSTOSCOPY N/A 06/29/2020   Procedure: [place];  Surgeon: [name], MD;  Location: [facility];  Service: Urology;  Laterality: N/A;   CYSTOSCOPY WITH FULGERATION Bilateral 06/17/2020   Procedure: CYSTOSCOPY,BILATERAL RETROGRADE, CLOT EVACUATION WITH FULGERATION OF THE BLADDER;  Surgeon: Janith Lima, MD;  Location: [facility];  Service: Urology;  Laterality: Bilateral;   CYSTOSCOPY WITH FULGERATION N/A 06/28/2020   Procedure: CYSTOSCOPY WITH CLOT EVACUATION AND FULGERATION OF BLEEDERS;  Surgeon: [name], MD;  Location: [facility];  Service: Urology;  Laterality: N/A;  1 HR   I & D EXTREMITY Right 12/15/2018   Procedure: DEBRIDEMENT RIGHT FOOT;  Surgeon: [name], MD;  Location: [facility];  Service: Orthopedics;  Laterality: Right;   I & D EXTREMITY Right 08/27/2019   Procedure: PARTIAL CUBOID EXCISION RIGHT FOOT;  Surgeon: [name], MD;  Location: [facility];  Service: Orthopedics;  Laterality: Right;   I & D EXTREMITY Right 01/07/2020   Procedure: RIGHT FOOT EXCISION INFECTED BONE;  Surgeon: [name], MD;  Location[facility];  Service: Orthopedics;  Laterality: Right;   I & D EXTREMITY Right 05/17/2022   Procedure: IRRIGATION AND DEBRIDEMENT RIGHT FOOT ABSCESS;   Surgeon: [name], MD;  Location: [facility];  Service: Orthopedics;  Laterality: Right;   NECK SURGERY     novemver 2019   STUMP REVISION Left 06/27/2021   Procedure: REVISION LEFT BELOW KNEE AMPUTATION;  Surgeon: [name], MD;  Location: [facility];  Service: Orthopedics;  Laterality: Left;   TRANSURETHRAL RESECTION OF PROSTATE N/A 06/28/2020   Procedure: TRANSURETHRAL RESECTION OF THE PROSTATE (TURP);  Surgeon: [name], MD;  Location: [facility];  Service: Urology;  Laterality: N/A;   WISDOM TOOTH EXTRACTION     Social History   Occupational History   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

## 2022-06-06 ENCOUNTER — Ambulatory Visit: Payer: Medicare Other | Admitting: Orthopedic Surgery

## 2022-06-17 ENCOUNTER — Ambulatory Visit (INDEPENDENT_AMBULATORY_CARE_PROVIDER_SITE_OTHER): Payer: Medicare Other | Admitting: Orthopedic Surgery

## 2022-06-17 DIAGNOSIS — L97411 Non-pressure chronic ulcer of right heel and midfoot limited to breakdown of skin: Secondary | ICD-10-CM | POA: Diagnosis not present

## 2022-06-19 ENCOUNTER — Encounter: Payer: Self-pay | Admitting: Orthopedic Surgery

## 2022-06-19 NOTE — Progress Notes (Signed)
Office Visit Note   Patient: [name]           Date of Birth: 30-Apr-1950           MRN: [id number] Visit Date: 06/17/2022              Requested by: Lorenda Hatchet, FNP [id number] [place],  [place] [address] PCP: Lorenda Hatchet, FNP  Chief Complaint  Patient presents with   Right Foot - Routine Post Op    05/17/2022 I&D right foot       HPI: Patient is a 72 year old gentleman who is status post irrigation debridement plantar abscess right foot 4 weeks out.  Patient is minimizing his weightbearing using a wheelchair.  He has completed his antibiotics.  Assessment & Plan: Visit Diagnoses:  1. Midfoot ulcer, right, limited to breakdown of skin ([place])     Plan: Kerecis tissue graft was applied reevaluate in 1 week.  Sutures harvested.  Follow-Up Instructions: Return in about 1 week (around 06/24/2022).   Ortho Exam  Patient is alert, oriented, no adenopathy, well-dressed, normal affect, normal respiratory effort. Examination the plantar wound has good bleeding granulation tissue the sutures are harvested.  The wound is 2 x 4 cm and 2 cm deep.  The wound healing has stalled, the wound bed has healthy granulation tissue, and patient presents for evaluation and application of [place] Micro graft. After informed consent a 10 blade knife was used to debride the skin and soft tissue to healthy viable bleeding granulation tissue.  Silver nitrate was used for hemostasis. The wound measures: 4 cm in length, 2cm  in width, 20 mm in depth, wound location plantar right foot Kerecis MariGen micro tissue graft 19 cm2 was applied, and there was no wastage.  Please see the photo below of the Lot number and expiration date. The micro tissue graft was covered with a nonadherent Adaptic dressing, bolstered with 4 x 4 gauze and secured with a compression wrap.     Imaging: No results found.     Labs: Lab Results  Component Value Date   HGBA1C 5.0  05/17/2022   HGBA1C 5.5 05/04/2021   HGBA1C 6.3 (H) 02/02/2021   ESRSEDRATE 34 (H) 01/24/2020   ESRSEDRATE 41 (H) 01/10/2020   ESRSEDRATE 55 (H) 12/27/2019   CRP 8.8 (H) 01/24/2020   CRP 35.9 (H) 01/10/2020   CRP 77.9 (H) 12/27/2019   REPTSTATUS 05/21/2022 FINAL 05/17/2022   GRAMSTAIN  05/17/2022    ABUNDANT WBC PRESENT, PREDOMINANTLY PMN ABUNDANT GRAM POSITIVE COCCI ABUNDANT GRAM NEGATIVE RODS    CULT  05/17/2022    MODERATE PROTEUS MIRABILIS FEW ENTEROCOCCUS FAECALIS FEW STAPHYLOCOCCUS AUREUS ABUNDANT BACTEROIDES SPECIES NOT FRAGILIS BETA LACTAMASE POSITIVE Performed at [facility], [place] [address] [place], [place] [address]    [place] 05/17/2022   LABORGA ENTEROCOCCUS FAECALIS 05/17/2022   LABORGA STAPHYLOCOCCUS AUREUS 05/17/2022     Lab Results  Component Value Date   ALBUMIN 4.1 12/20/2021   ALBUMIN 3.3 (L) 08/20/2021   ALBUMIN 3.7 08/19/2021   PREALBUMIN 32.7 05/09/2021   PREALBUMIN 14.8 (L) 12/14/2018    Lab Results  Component Value Date   MG 2.4 05/14/2021   MG 1.9 05/09/2021   MG 2.0 06/28/2020   Lab Results  Component Value Date   VD25OH 42.0 11/19/2021   VD25OH 16.13 (L) 05/09/2021    Lab Results  Component Value Date   PREALBUMIN 32.7 05/09/2021   PREALBUMIN 14.8 (  L) 12/14/2018      Latest Ref Rng & Units 05/17/2022    9:11 AM 12/20/2021    3:33 PM 08/20/2021    1:44 AM  CBC EXTENDED  WBC 4.0 - 10.5 K/uL  4.8  4.7   RBC 4.22 - 5.81 Mil/uL  3.62  3.18   Hemoglobin 13.0 - 17.0 g/dL 8.5  10.1  9.0   HCT 39.0 - 52.0 % 25.0  30.0  27.8   Platelets 150.0 - 400.0 K/uL  77.0  82   NEUT# 1.4 - 7.7 K/uL  3.9    Lymph# 0.7 - 4.0 K/uL  0.3       There is no height or weight on file to calculate BMI.  Orders:  No orders of the defined types were placed in this encounter.  No orders of the defined types were placed in this encounter.    Procedures: No procedures performed  Clinical Data: No additional  findings.  ROS:  All other systems negative, except as noted in the HPI. Review of Systems  Objective: Vital Signs: There were no vitals taken for this visit.  Specialty Comments:  No specialty comments available.  PMFS History: Patient Active Problem List   Diagnosis Date Noted   Abscess of right foot 05/17/2022   Contraindication to anticoagulation therapy GU Bleed 03/13/2022   Community acquired pneumonia of left lower lobe of lung 08/18/2021   Paroxysmal atrial fibrillation ([place]) 08/18/2021   Anemia of chronic renal failure 08/18/2021   Thrombocytopenia ([place]) 123456   Acute metabolic encephalopathy 123456   Action induced myoclonus 08/18/2021   Left below-knee amputee ([place]) 05/11/2021   Symptomatic anemia 09/13/2020   COVID-19 virus infection 09/13/2020   Pressure injury of skin 06/29/2020   Gross hematuria 06/16/2020   Typical atrial flutter ([place]) 03/24/2020   Bilateral pleural effusion 02/24/2020   Healthcare maintenance 01/28/2020   Shortness of breath 01/28/2020   History of partial ray amputation of fourth toe of right foot ([place]) 09/08/2019   Chronic cough 06/25/2019   Cutaneous abscess of right foot    AKI (acute kidney injury) ([place]) 12/14/2018   ESRD on hemodialysis ([place]) 12/14/2018   Gait abnormality 08/24/2018   Diabetic neuropathy ([place]) 02/06/2018   Cervical myelopathy ([place]) 02/06/2018   Onychomycosis 10/30/2017   Other spondylosis with radiculopathy, cervical region 01/27/2017   Midfoot ulcer, right, limited to breakdown of skin ([place]) 11/15/2016   Lateral epicondylitis, left elbow 08/12/2016   Prostate cancer ([place]) 06/19/2016   Gout 09/14/2012   Hypertension associated with diabetes ([place]) 09/14/2012   Hypothyroidism 09/14/2012   CAD (coronary artery disease) 09/14/2012   Insulin dependent type 2 diabetes mellitus ([place]) 09/14/2012   Hyperlipidemia associated with type 2 diabetes mellitus ([place])    Neuropathy (HCC)    Past Medical History:   Diagnosis Date   Acute osteomyelitis of metatarsal bone of left foot ([place])    Ambulates with cane    straight cane   Anemia    Cervical myelopathy ([place]) 02/06/2018   Chronic kidney disease    dailysis M W F- home   Complication of anesthesia    Coronary artery disease    Dehiscence of amputation stump of left lower extremity ([place])    Diabetes mellitus without complication ([place])    type2   Diabetic foot ulcer ([place])    Diabetic neuropathy ([place]) 02/06/2018   Dysrhythmia    Gait abnormality 08/24/2018   GERD (gastroesophageal reflux disease)     01/06/20- not current  Gout    History of blood transfusion    History of blood transfusion    History of kidney stones    passed stones   Hypercholesteremia    Hypertension    Hypothyroidism    Neuromuscular disorder (HCC)    neuropathy left leg and bilateral feet   Neuropathy    Partial nontraumatic amputation of foot, left ([place]) 02/20/2021   PONV (postoperative nausea and vomiting)    Prostate cancer (HCC)    PVD (peripheral vascular disease) (HCC)    with amputations    Family History  Problem Relation Age of Onset   Diabetes Mellitus II Mother    Kidney disease Mother    Diabetes Mellitus II Father    CAD Father    Cancer Father        prostate   Kidney disease Father    Diabetes Mellitus II Brother    Kidney disease Brother    Diabetes Mellitus II Brother    Stomach cancer Brother 49   Kidney disease Brother    Colon cancer Neg Hx    Colon polyps Neg Hx    Esophageal cancer Neg Hx    Rectal cancer Neg Hx    Pancreatic cancer Neg Hx     Past Surgical History:  Procedure Laterality Date   A-FLUTTER ABLATION N/A 04/06/2020   Procedure: A-FLUTTER ABLATION;  Surgeon: [name], MD;  Location: [facility];  Service: Cardiovascular;  Laterality: N/A;   AMPUTATION Left 12/25/2013   Procedure: AMPUTATION RAY LEFT 5TH RAY;  Surgeon: [name], MD;  [facility];  Service: Orthopedics;  Laterality:  Left;   AMPUTATION Right 12/15/2018   Procedure: AMPUTATION OF 4TH AND 5TH TOES RIGHT FOOT;  Surgeon: [name], MD;  Location: [facility];  Service: Orthopedics;  Laterality: Right;   AMPUTATION Left 02/02/2021   Procedure: LEFT FOOT 4TH RAY AMPUTATION;  Surgeon: [name], MD;  Location: [facility];  Service: Orthopedics;  Laterality: Left;   AMPUTATION Left 05/09/2021   Procedure: LEFT BELOW KNEE AMPUTATION;  Surgeon: [name], MD;  Location: [facility];  Service: Orthopedics;  Laterality: Left;   APPLICATION OF WOUND VAC Right 12/15/2018   Procedure: APPLICATION OF WOUND VAC;  Surgeon: [name], MD;  Location: [facility];  Service: Orthopedics;  Laterality: Right;   AV FISTULA PLACEMENT Left 03/25/2019   Procedure: LEFT ARM ARTERIOVENOUS (AV) FISTULA CREATION;  Surgeon: [name], MD;  Location: [facility];  Service: Vascular;  Laterality: Left;   [place] Left 05/20/2019   Procedure: SECOND STAGE LEFT BASCILIC VEIN TRANSPOSITION;  Surgeon: [name], MD;  Location: [facility];  Service: Vascular;  Laterality: Left;   CARDIAC CATHETERIZATION  02/17/2014   CHOLECYSTECTOMY     COLONOSCOPY  2011   in [place], [place]  2008   CYSTOSCOPY N/A 06/29/2020   Procedure: [place];  Surgeon: [name], MD;  Location: [facility];  Service: Urology;  Laterality: N/A;   CYSTOSCOPY WITH FULGERATION Bilateral 06/17/2020   Procedure: CYSTOSCOPY,BILATERAL RETROGRADE, CLOT EVACUATION WITH FULGERATION OF THE BLADDER;  Surgeon: Janith Lima, MD;  Location: [facility];  Service: Urology;  Laterality: Bilateral;   CYSTOSCOPY WITH FULGERATION N/A 06/28/2020   Procedure: CYSTOSCOPY WITH CLOT EVACUATION AND FULGERATION OF BLEEDERS;  Surgeon: [name], MD;  Location: [facility];  Service: Urology;  Laterality:  N/A;  1 HR   I & D EXTREMITY Right 12/15/2018   Procedure: DEBRIDEMENT RIGHT FOOT;  Surgeon:  [name], MD;  Location: [facility];  Service: Orthopedics;  Laterality: Right;   I & D EXTREMITY Right 08/27/2019   Procedure: PARTIAL CUBOID EXCISION RIGHT FOOT;  Surgeon: [name], MD;  Location: [facility];  Service: Orthopedics;  Laterality: Right;   I & D EXTREMITY Right 01/07/2020   Procedure: RIGHT FOOT EXCISION INFECTED BONE;  Surgeon: [name], MD;  Location: [facility];  Service: Orthopedics;  Laterality: Right;   I & D EXTREMITY Right 05/17/2022   Procedure: IRRIGATION AND DEBRIDEMENT RIGHT FOOT ABSCESS;  Surgeon: [name], MD;  Location: [facility];  Service: Orthopedics;  Laterality: Right;   NECK SURGERY     novemver 2019   STUMP REVISION Left 06/27/2021   Procedure: REVISION LEFT BELOW KNEE AMPUTATION;  Surgeon: [name], MD;  Location: [facility];  Service: Orthopedics;  Laterality: Left;   TRANSURETHRAL RESECTION OF PROSTATE N/A 06/28/2020   Procedure: TRANSURETHRAL RESECTION OF THE PROSTATE (TURP);  Surgeon: [name], MD;  Location: [facility];  Service: Urology;  Laterality: N/A;   WISDOM TOOTH EXTRACTION     Social History   Occupational History   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

## 2022-06-20 ENCOUNTER — Encounter (HOSPITAL_BASED_OUTPATIENT_CLINIC_OR_DEPARTMENT_OTHER): Payer: Self-pay | Admitting: Emergency Medicine

## 2022-06-20 ENCOUNTER — Emergency Department (HOSPITAL_BASED_OUTPATIENT_CLINIC_OR_DEPARTMENT_OTHER): Payer: Medicare Other

## 2022-06-20 ENCOUNTER — Inpatient Hospital Stay (HOSPITAL_BASED_OUTPATIENT_CLINIC_OR_DEPARTMENT_OTHER)
Admission: EM | Admit: 2022-06-20 | Discharge: 2022-06-23 | DRG: 377 | Disposition: A | Discharge status: Home Health | Payer: Medicare Other | Attending: Internal Medicine | Admitting: Internal Medicine

## 2022-06-20 ENCOUNTER — Other Ambulatory Visit: Payer: Self-pay

## 2022-06-20 DIAGNOSIS — E7800 Pure hypercholesterolemia, unspecified: Secondary | ICD-10-CM | POA: Diagnosis not present

## 2022-06-20 DIAGNOSIS — Z9049 Acquired absence of other specified parts of digestive tract: Secondary | ICD-10-CM

## 2022-06-20 DIAGNOSIS — Z885 Allergy status to narcotic agent status: Secondary | ICD-10-CM

## 2022-06-20 DIAGNOSIS — D649 Anemia, unspecified: Principal | ICD-10-CM

## 2022-06-20 DIAGNOSIS — Z8249 Family history of ischemic heart disease and other diseases of the circulatory system: Secondary | ICD-10-CM

## 2022-06-20 DIAGNOSIS — I2510 Atherosclerotic heart disease of native coronary artery without angina pectoris: Secondary | ICD-10-CM | POA: Diagnosis not present

## 2022-06-20 DIAGNOSIS — Z89512 Acquired absence of left leg below knee: Secondary | ICD-10-CM

## 2022-06-20 DIAGNOSIS — D122 Benign neoplasm of ascending colon: Secondary | ICD-10-CM | POA: Diagnosis present

## 2022-06-20 DIAGNOSIS — Z7985 Long-term (current) use of injectable non-insulin antidiabetic drugs: Secondary | ICD-10-CM

## 2022-06-20 DIAGNOSIS — I4892 Unspecified atrial flutter: Secondary | ICD-10-CM | POA: Diagnosis present

## 2022-06-20 DIAGNOSIS — Z79899 Other long term (current) drug therapy: Secondary | ICD-10-CM

## 2022-06-20 DIAGNOSIS — E1141 Type 2 diabetes mellitus with diabetic mononeuropathy: Secondary | ICD-10-CM | POA: Diagnosis present

## 2022-06-20 DIAGNOSIS — E1142 Type 2 diabetes mellitus with diabetic polyneuropathy: Secondary | ICD-10-CM

## 2022-06-20 DIAGNOSIS — N2581 Secondary hyperparathyroidism of renal origin: Secondary | ICD-10-CM | POA: Diagnosis present

## 2022-06-20 DIAGNOSIS — D62 Acute posthemorrhagic anemia: Secondary | ICD-10-CM | POA: Diagnosis present

## 2022-06-20 DIAGNOSIS — Z945 Skin transplant status: Secondary | ICD-10-CM

## 2022-06-20 DIAGNOSIS — Z87891 Personal history of nicotine dependence: Secondary | ICD-10-CM | POA: Diagnosis not present

## 2022-06-20 DIAGNOSIS — E039 Hypothyroidism, unspecified: Secondary | ICD-10-CM | POA: Diagnosis present

## 2022-06-20 DIAGNOSIS — I120 Hypertensive chronic kidney disease with stage 5 chronic kidney disease or end stage renal disease: Secondary | ICD-10-CM | POA: Diagnosis present

## 2022-06-20 DIAGNOSIS — E785 Hyperlipidemia, unspecified: Secondary | ICD-10-CM

## 2022-06-20 DIAGNOSIS — Z794 Long term (current) use of insulin: Secondary | ICD-10-CM | POA: Diagnosis not present

## 2022-06-20 DIAGNOSIS — D638 Anemia in other chronic diseases classified elsewhere: Secondary | ICD-10-CM | POA: Diagnosis present

## 2022-06-20 DIAGNOSIS — Z89421 Acquired absence of other right toe(s): Secondary | ICD-10-CM | POA: Diagnosis present

## 2022-06-20 DIAGNOSIS — K648 Other hemorrhoids: Secondary | ICD-10-CM | POA: Diagnosis present

## 2022-06-20 DIAGNOSIS — I152 Hypertension secondary to endocrine disorders: Secondary | ICD-10-CM

## 2022-06-20 DIAGNOSIS — I483 Typical atrial flutter: Secondary | ICD-10-CM | POA: Diagnosis present

## 2022-06-20 DIAGNOSIS — M109 Gout, unspecified: Secondary | ICD-10-CM | POA: Diagnosis present

## 2022-06-20 DIAGNOSIS — K2289 Other specified disease of esophagus: Secondary | ICD-10-CM | POA: Diagnosis present

## 2022-06-20 DIAGNOSIS — R54 Age-related physical debility: Secondary | ICD-10-CM | POA: Diagnosis present

## 2022-06-20 DIAGNOSIS — R195 Other fecal abnormalities: Secondary | ICD-10-CM

## 2022-06-20 DIAGNOSIS — Z9079 Acquired absence of other genital organ(s): Secondary | ICD-10-CM

## 2022-06-20 DIAGNOSIS — E1122 Type 2 diabetes mellitus with diabetic chronic kidney disease: Secondary | ICD-10-CM | POA: Diagnosis not present

## 2022-06-20 DIAGNOSIS — Z800 Family history of malignant neoplasm of digestive organs: Secondary | ICD-10-CM

## 2022-06-20 DIAGNOSIS — Z1152 Encounter for screening for COVID-19: Secondary | ICD-10-CM | POA: Diagnosis not present

## 2022-06-20 DIAGNOSIS — D631 Anemia in chronic kidney disease: Secondary | ICD-10-CM | POA: Diagnosis not present

## 2022-06-20 DIAGNOSIS — I480 Paroxysmal atrial fibrillation: Secondary | ICD-10-CM | POA: Diagnosis not present

## 2022-06-20 DIAGNOSIS — E1169 Type 2 diabetes mellitus with other specified complication: Secondary | ICD-10-CM | POA: Diagnosis not present

## 2022-06-20 DIAGNOSIS — Z951 Presence of aortocoronary bypass graft: Secondary | ICD-10-CM

## 2022-06-20 DIAGNOSIS — Z833 Family history of diabetes mellitus: Secondary | ICD-10-CM

## 2022-06-20 DIAGNOSIS — I10 Essential (primary) hypertension: Secondary | ICD-10-CM | POA: Diagnosis present

## 2022-06-20 DIAGNOSIS — Z992 Dependence on renal dialysis: Secondary | ICD-10-CM

## 2022-06-20 DIAGNOSIS — Z8701 Personal history of pneumonia (recurrent): Secondary | ICD-10-CM

## 2022-06-20 DIAGNOSIS — D696 Thrombocytopenia, unspecified: Secondary | ICD-10-CM | POA: Diagnosis not present

## 2022-06-20 DIAGNOSIS — E1151 Type 2 diabetes mellitus with diabetic peripheral angiopathy without gangrene: Secondary | ICD-10-CM | POA: Diagnosis not present

## 2022-06-20 DIAGNOSIS — K5520 Angiodysplasia of colon without hemorrhage: Secondary | ICD-10-CM | POA: Diagnosis present

## 2022-06-20 DIAGNOSIS — Z87442 Personal history of urinary calculi: Secondary | ICD-10-CM

## 2022-06-20 DIAGNOSIS — K31811 Angiodysplasia of stomach and duodenum with bleeding: Secondary | ICD-10-CM | POA: Diagnosis not present

## 2022-06-20 DIAGNOSIS — R7401 Elevation of levels of liver transaminase levels: Secondary | ICD-10-CM | POA: Diagnosis present

## 2022-06-20 DIAGNOSIS — N189 Chronic kidney disease, unspecified: Secondary | ICD-10-CM | POA: Diagnosis present

## 2022-06-20 DIAGNOSIS — D500 Iron deficiency anemia secondary to blood loss (chronic): Secondary | ICD-10-CM

## 2022-06-20 DIAGNOSIS — E119 Type 2 diabetes mellitus without complications: Secondary | ICD-10-CM

## 2022-06-20 DIAGNOSIS — Z79890 Hormone replacement therapy (postmenopausal): Secondary | ICD-10-CM

## 2022-06-20 DIAGNOSIS — Z841 Family history of disorders of kidney and ureter: Secondary | ICD-10-CM

## 2022-06-20 DIAGNOSIS — D124 Benign neoplasm of descending colon: Secondary | ICD-10-CM

## 2022-06-20 DIAGNOSIS — I4821 Permanent atrial fibrillation: Secondary | ICD-10-CM | POA: Diagnosis present

## 2022-06-20 DIAGNOSIS — K3189 Other diseases of stomach and duodenum: Secondary | ICD-10-CM | POA: Diagnosis present

## 2022-06-20 DIAGNOSIS — E1140 Type 2 diabetes mellitus with diabetic neuropathy, unspecified: Secondary | ICD-10-CM | POA: Diagnosis present

## 2022-06-20 DIAGNOSIS — Z91018 Allergy to other foods: Secondary | ICD-10-CM

## 2022-06-20 DIAGNOSIS — G8918 Other acute postprocedural pain: Secondary | ICD-10-CM | POA: Diagnosis present

## 2022-06-20 DIAGNOSIS — D120 Benign neoplasm of cecum: Secondary | ICD-10-CM

## 2022-06-20 DIAGNOSIS — K317 Polyp of stomach and duodenum: Secondary | ICD-10-CM | POA: Diagnosis present

## 2022-06-20 DIAGNOSIS — Z89411 Acquired absence of right great toe: Secondary | ICD-10-CM

## 2022-06-20 DIAGNOSIS — E1159 Type 2 diabetes mellitus with other circulatory complications: Secondary | ICD-10-CM | POA: Diagnosis present

## 2022-06-20 DIAGNOSIS — K5730 Diverticulosis of large intestine without perforation or abscess without bleeding: Secondary | ICD-10-CM | POA: Diagnosis present

## 2022-06-20 DIAGNOSIS — K219 Gastro-esophageal reflux disease without esophagitis: Secondary | ICD-10-CM | POA: Diagnosis present

## 2022-06-20 DIAGNOSIS — D123 Benign neoplasm of transverse colon: Secondary | ICD-10-CM

## 2022-06-20 DIAGNOSIS — N186 End stage renal disease: Secondary | ICD-10-CM

## 2022-06-20 DIAGNOSIS — N250 Renal osteodystrophy: Secondary | ICD-10-CM | POA: Diagnosis present

## 2022-06-20 DIAGNOSIS — Z8546 Personal history of malignant neoplasm of prostate: Secondary | ICD-10-CM

## 2022-06-20 LAB — CBC WITH DIFFERENTIAL/PLATELET
Abs Immature Granulocytes: 0.39 10*3/uL — ABNORMAL HIGH (ref 0.00–0.07)
Basophils Absolute: 0.1 10*3/uL (ref 0.0–0.1)
Basophils Relative: 1 %
Eosinophils Absolute: 0 10*3/uL (ref 0.0–0.5)
Eosinophils Relative: 0 %
HCT: 15 % — ABNORMAL LOW (ref 39.0–52.0)
Hemoglobin: 4.9 g/dL — CL (ref 13.0–17.0)
Immature Granulocytes: 4 %
Lymphocytes Relative: 4 %
Lymphs Abs: 0.4 10*3/uL — ABNORMAL LOW (ref 0.7–4.0)
MCH: 31.2 pg (ref 26.0–34.0)
MCHC: 32.7 g/dL (ref 30.0–36.0)
MCV: 95.5 fL (ref 80.0–100.0)
Monocytes Absolute: 0.9 10*3/uL (ref 0.1–1.0)
Monocytes Relative: 10 %
Neutro Abs: 8.1 10*3/uL — ABNORMAL HIGH (ref 1.7–7.7)
Neutrophils Relative %: 81 %
Platelets: 116 10*3/uL — ABNORMAL LOW (ref 150–400)
RBC: 1.57 MIL/uL — ABNORMAL LOW (ref 4.22–5.81)
RDW: 21.5 % — ABNORMAL HIGH (ref 11.5–15.5)
Smear Review: NORMAL
WBC: 9.8 10*3/uL (ref 4.0–10.5)
nRBC: 0.6 % — ABNORMAL HIGH (ref 0.0–0.2)

## 2022-06-20 LAB — HEMOGLOBIN AND HEMATOCRIT, BLOOD
HCT: 16.3 % — ABNORMAL LOW (ref 39.0–52.0)
Hemoglobin: 5 g/dL — CL (ref 13.0–17.0)

## 2022-06-20 LAB — OCCULT BLOOD X 1 CARD TO LAB, STOOL: Fecal Occult Bld: NEGATIVE

## 2022-06-20 LAB — PROTIME-INR
INR: 1.5 — ABNORMAL HIGH (ref 0.8–1.2)
Prothrombin Time: 17.6 seconds — ABNORMAL HIGH (ref 11.4–15.2)

## 2022-06-20 LAB — OPHTHALMOLOGY REPORT-SCANNED

## 2022-06-20 LAB — COMPREHENSIVE METABOLIC PANEL
ALT: 168 U/L — ABNORMAL HIGH (ref 0–44)
AST: 200 U/L — ABNORMAL HIGH (ref 15–41)
Albumin: 4.1 g/dL (ref 3.5–5.0)
Alkaline Phosphatase: 86 U/L (ref 38–126)
Anion gap: 14 (ref 5–15)
BUN: 58 mg/dL — ABNORMAL HIGH (ref 8–23)
CO2: 29 mmol/L (ref 22–32)
Calcium: 8.9 mg/dL (ref 8.9–10.3)
Chloride: 89 mmol/L — ABNORMAL LOW (ref 98–111)
Creatinine, Ser: 6.49 mg/dL — ABNORMAL HIGH (ref 0.61–1.24)
GFR, Estimated: 8 mL/min — ABNORMAL LOW (ref >60)
Glucose, Bld: 142 mg/dL — ABNORMAL HIGH (ref 70–99)
Potassium: 4.5 mmol/L (ref 3.5–5.1)
Sodium: 132 mmol/L — ABNORMAL LOW (ref 135–145)
Total Bilirubin: 0.8 mg/dL (ref 0.3–1.2)
Total Protein: 6.1 g/dL — ABNORMAL LOW (ref 6.5–8.1)

## 2022-06-20 LAB — RESP PANEL BY RT-PCR (RSV, FLU A&B, COVID)  RVPGX2
Influenza A by PCR: NEGATIVE
Influenza B by PCR: NEGATIVE
Resp Syncytial Virus by PCR: NEGATIVE
SARS Coronavirus 2 by RT PCR: NEGATIVE

## 2022-06-20 LAB — TSH: TSH: 4.452 u[IU]/mL (ref 0.350–4.500)

## 2022-06-20 LAB — GLUCOSE, CAPILLARY: Glucose-Capillary: 177 mg/dL — ABNORMAL HIGH (ref 70–99)

## 2022-06-20 LAB — PREPARE RBC (CROSSMATCH)

## 2022-06-20 LAB — CBG MONITORING, ED: Glucose-Capillary: 136 mg/dL — ABNORMAL HIGH (ref 70–99)

## 2022-06-20 LAB — LIPASE, BLOOD: Lipase: 147 U/L — ABNORMAL HIGH (ref 11–51)

## 2022-06-20 MED ORDER — POLYETHYLENE GLYCOL 3350 17 G PO PACK: 17.0000 g | PACK | Freq: Every day | ORAL | Status: DC | PRN

## 2022-06-20 MED ORDER — ACETAMINOPHEN 325 MG PO TABS
650.0000 mg | ORAL_TABLET | Freq: Four times a day (QID) | ORAL | Status: DC | PRN
  Administered 2022-06-21 – 2022-06-22 (×2): 650 mg via ORAL
  Filled 2022-06-20 (×2): qty 2

## 2022-06-20 MED ORDER — SODIUM CHLORIDE 0.9% IV SOLUTION: Freq: Once | INTRAVENOUS | Status: DC

## 2022-06-20 MED ORDER — ROSUVASTATIN CALCIUM 5 MG PO TABS
10.0000 mg | ORAL_TABLET | Freq: Every day | ORAL | Status: DC
  Administered 2022-06-21 – 2022-06-23 (×3): 10 mg via ORAL
  Filled 2022-06-20 (×3): qty 2

## 2022-06-20 MED ORDER — ALLOPURINOL 100 MG PO TABS
200.0000 mg | ORAL_TABLET | Freq: Every day | ORAL | Status: DC
  Administered 2022-06-21 – 2022-06-23 (×3): 200 mg via ORAL
  Filled 2022-06-20 (×3): qty 2

## 2022-06-20 MED ORDER — INSULIN ASPART 100 UNIT/ML IJ SOLN
0.0000 [IU] | Freq: Three times a day (TID) | INTRAMUSCULAR | Status: DC
  Administered 2022-06-21 – 2022-06-22 (×3): 1 [IU] via SUBCUTANEOUS

## 2022-06-20 MED ORDER — SODIUM CHLORIDE 0.9% FLUSH
3.0000 mL | Freq: Two times a day (BID) | INTRAVENOUS | Status: DC
  Administered 2022-06-20 – 2022-06-23 (×5): 3 mL via INTRAVENOUS

## 2022-06-20 MED ORDER — SEVELAMER CARBONATE 800 MG PO TABS
800.0000 mg | ORAL_TABLET | Freq: Every day | ORAL | Status: DC
  Administered 2022-06-21: 800 mg via ORAL
  Filled 2022-06-20: qty 1

## 2022-06-20 MED ORDER — CARVEDILOL 12.5 MG PO TABS
12.5000 mg | ORAL_TABLET | Freq: Two times a day (BID) | ORAL | Status: DC
  Administered 2022-06-21 – 2022-06-23 (×4): 12.5 mg via ORAL
  Filled 2022-06-20 (×4): qty 1

## 2022-06-20 MED ORDER — PREGABALIN 75 MG PO CAPS
150.0000 mg | ORAL_CAPSULE | Freq: Three times a day (TID) | ORAL | Status: DC
  Administered 2022-06-20 – 2022-06-23 (×7): 150 mg via ORAL
  Filled 2022-06-20 (×7): qty 2

## 2022-06-20 MED ORDER — PANTOPRAZOLE SODIUM 40 MG PO TBEC
40.0000 mg | DELAYED_RELEASE_TABLET | Freq: Every day | ORAL | Status: DC
  Administered 2022-06-21 – 2022-06-22 (×2): 40 mg via ORAL
  Filled 2022-06-20 (×2): qty 1

## 2022-06-20 MED ORDER — ACETAMINOPHEN 650 MG RE SUPP: 650.0000 mg | Freq: Four times a day (QID) | RECTAL | Status: DC | PRN

## 2022-06-20 MED ORDER — LEVOTHYROXINE SODIUM 112 MCG PO TABS
112.0000 ug | ORAL_TABLET | Freq: Every day | ORAL | Status: DC
  Administered 2022-06-21 – 2022-06-22 (×2): 112 ug via ORAL
  Filled 2022-06-20 (×2): qty 1

## 2022-06-20 NOTE — Progress Notes (Signed)
Blood transfusion was ordered for this patient. Started transfusion at 2023, post vitals signs taken 15 minutes after starting blood products, WNL. Patient denies any signs and symptoms of adverse reaction. Stable, family members at bedside. Will continue to monitor.

## 2022-06-20 NOTE — Progress Notes (Signed)
Blood transfusion ended at 2255. Vital signs taken, WNL. Patient denies any complaint at this time. Hgb ordered, awaiting lab to collect blood. Will continue to monitor.

## 2022-06-20 NOTE — ED Notes (Signed)
3W called to attempt report. Receiving RN is off the floor. Will call back.

## 2022-06-20 NOTE — H&P (Signed)
History and Physical   [name] [id number] DOB: [birth year] DOA: 06/20/2022  PCP: Lorenda Hatchet, FNP   Patient coming from: Home  Chief Complaint: Abdominal pain, nausea, melena  HPI: [name] is a 72 y.o. male with medical history significant of atrial flutter status post ablation, thrombocytopenia, anemia, status post left BKA, status post right great amputation, CAD status post CABG, prostate cancer, diabetes, hypothyroidism, hypertension, hyperlipidemia, gout, ESRD on HD, GERD, PAD presenting with abdominal pain, nausea, melena.  Patient has had about a week of worsening fatigue with intermittent abdominal pain and intermittent melanotic stool with occasional streaks of blood.  None today.  Last dialysis session was yesterday.  Patient's wife states he is also on iron and that he recently had skin graft applied for his right lower extremity wound and there was some bleeding during that process in the orthopedic clinic.  Denies fevers, chills, chest pain, shortness of breath, constipation, diarrhea.  ED Course: Vital signs in the ED significant for blood pressure in the 0000000 to 0000000 systolic.  Lab workup included CMP with sodium of 132, chloride 89, BUN 50, creatinine 6.49, glucose 142, protein 6.1, AST 200, ALT 160.  CBC with hemoglobin of 4.9 from baseline of 8-10 a month or 2 ago, platelets 116.  PT and INR mildly elevated at 17.6 and 1.5 respectively.  FOBT negative.  Lipase 147, TSH normal, respiratory panel for flu COVID RSV negative.  Urinalysis pending.  Chest x-ray showing no acute abnormality and noting patient is being status post CABG.  CT the abdomen pelvis showed no acute normality did note small simple free fluid, stable adrenal myolipoma, atrophic kidneys, small left pleural effusion, irregular contour of liver, status postcholecystectomy.  Patient initially was to be transferred ED ED to expedite transfusion but was able to get a bed on progressive unit quickly.   Nephrology consulted due to patient being ESRD and likely needing multiple transfusions.  Review of Systems: As per HPI otherwise all other systems reviewed and are negative.  Past Medical History:  Diagnosis Date   Acute osteomyelitis of metatarsal bone of left foot ([place])    Ambulates with cane    straight cane   Anemia    Cervical myelopathy (HCC) 02/06/2018   Chronic kidney disease    dailysis M W F- home   Community acquired pneumonia of left lower lobe of lung [id number]   Complication of anesthesia    Coronary artery disease    Dehiscence of amputation stump of left lower extremity (HCC)    Diabetes mellitus without complication ([place])    type2   Diabetic foot ulcer ([place])    Diabetic neuropathy ([place]) 02/06/2018   Dysrhythmia    Gait abnormality 08/24/2018   GERD (gastroesophageal reflux disease)     01/06/20- not current   Gout    History of blood transfusion    History of blood transfusion    History of kidney stones    passed stones   Hypercholesteremia    Hypertension    Hypothyroidism    Neuromuscular disorder (HCC)    neuropathy left leg and bilateral feet   Neuropathy    Partial nontraumatic amputation of foot, left ([place]) 02/20/2021   PONV (postoperative nausea and vomiting)    Prostate cancer ([place])    PVD (peripheral vascular disease) ([place])    with amputations    Past Surgical History:  Procedure Laterality Date   A-FLUTTER ABLATION N/A 04/06/2020   Procedure: A-FLUTTER ABLATION;  Surgeon: [name],  Champ Mungo, MD;  Location: [facility];  Service: Cardiovascular;  Laterality: N/A;   AMPUTATION Left 12/25/2013   Procedure: AMPUTATION RAY LEFT 5TH RAY;  Surgeon: [name], MD;  Location: [facility];  Service: Orthopedics;  Laterality: Left;   AMPUTATION Right 12/15/2018   Procedure: AMPUTATION OF 4TH AND 5TH TOES RIGHT FOOT;  Surgeon: [name], MD;  Location: [facility];  Service: Orthopedics;  Laterality: Right;   AMPUTATION Left 02/02/2021   Procedure:  LEFT FOOT 4TH RAY AMPUTATION;  Surgeon: [name], MD;  Location: [place];  Service: Orthopedics;  Laterality: Left;   AMPUTATION Left 05/09/2021   Procedure: LEFT BELOW KNEE AMPUTATION;  Surgeon: [name], MD;  Location: [place];  Service: Orthopedics;  Laterality: Left;   APPLICATION OF WOUND VAC Right 12/15/2018   Procedure: APPLICATION OF WOUND VAC;  Surgeon: [name], MD;  Location: [facility];  Service: Orthopedics;  Laterality: Right;   AV FISTULA PLACEMENT Left 03/25/2019   Procedure: LEFT ARM ARTERIOVENOUS (AV) FISTULA CREATION;  Surgeon: [name], MD;  Location: [facility];  Service: Vascular;  Laterality: Left;   [place] Left 05/20/2019   Procedure: SECOND STAGE LEFT BASCILIC VEIN TRANSPOSITION;  Surgeon: [name], MD;  Location: [facility];  Service: Vascular;  Laterality: Left;   CARDIAC CATHETERIZATION  02/17/2014   CHOLECYSTECTOMY     COLONOSCOPY  2011   in [place], [place]  2008   CYSTOSCOPY N/A 06/29/2020   Procedure: [place];  Surgeon: [name], MD;  Location: [place];  Service: Urology;  Laterality: N/A;   CYSTOSCOPY WITH FULGERATION Bilateral 06/17/2020   Procedure: CYSTOSCOPY,BILATERAL RETROGRADE, CLOT EVACUATION WITH FULGERATION OF THE BLADDER;  Surgeon: Janith Lima, MD;  Location: [facility];  Service: Urology;  Laterality: Bilateral;   CYSTOSCOPY WITH FULGERATION N/A 06/28/2020   Procedure: CYSTOSCOPY WITH CLOT EVACUATION AND FULGERATION OF BLEEDERS;  Surgeon: [name], MD;  Location: [facility];  Service: Urology;  Laterality: N/A;  1 HR   I & D EXTREMITY Right 12/15/2018   Procedure: DEBRIDEMENT RIGHT FOOT;  Surgeon: [name], MD;  Location: [facility];  Service: Orthopedics;  Laterality: Right;   I & D EXTREMITY Right 08/27/2019   Procedure: PARTIAL CUBOID EXCISION RIGHT FOOT;  Surgeon: [name], MD;  Location: [facility];  Service:  Orthopedics;  Laterality: Right;   I & D EXTREMITY Right 01/07/2020   Procedure: RIGHT FOOT EXCISION INFECTED BONE;  Surgeon: [name], MD;  Location: [facility];  Service: Orthopedics;  Laterality: Right;   I & D EXTREMITY Right 05/17/2022   Procedure: IRRIGATION AND DEBRIDEMENT RIGHT FOOT ABSCESS;  Surgeon: [name], MD;  Location: [facility];  Service: Orthopedics;  Laterality: Right;   NECK SURGERY     novemver 2019   STUMP REVISION Left 06/27/2021   Procedure: REVISION LEFT BELOW KNEE AMPUTATION;  Surgeon: [name], MD;  Location: [place];  Service: Orthopedics;  Laterality: Left;   TRANSURETHRAL RESECTION OF PROSTATE N/A 06/28/2020   Procedure: TRANSURETHRAL RESECTION OF THE PROSTATE (TURP);  Surgeon: [name], MD;  Location: [place];  Service: Urology;  Laterality: N/A;   WISDOM TOOTH EXTRACTION      Social History  reports that he quit smoking about 33 years ago. His smoking use included cigars. He started smoking about 49 years ago.  He has never used smokeless tobacco. He reports that he does not drink alcohol and does not use drugs.  Allergies  Allergen Reactions   Morphine     Other reaction(s): Delusions (intolerance)   Mushroom Extract Complex Nausea Only    Family History  Problem Relation Age of Onset   Diabetes Mellitus II Mother    Kidney disease Mother    Diabetes Mellitus II Father    CAD Father    Cancer Father        prostate   Kidney disease Father    Diabetes Mellitus II Brother    Kidney disease Brother    Diabetes Mellitus II Brother    Stomach cancer Brother 74   Kidney disease Brother    Colon cancer Neg Hx    Colon polyps Neg Hx    Esophageal cancer Neg Hx    Rectal cancer Neg Hx    Pancreatic cancer Neg Hx   Reviewed on admission  Prior to Admission medications   Medication Sig Start Date End Date Taking? Authorizing Provider  acetaminophen (TYLENOL) 500 MG tablet Take 1,000 mg by mouth every 8 (eight) hours as needed for mild pain  or headache.   Yes [provider]  allopurinol (ZYLOPRIM) 100 MG tablet Take 2 tablets (200 mg total) by mouth daily. 05/22/21  Yes Angiulli, Lavon Paganini, PA-C  carvedilol (COREG) 12.5 MG tablet TAKE 1 TABLET BY MOUTH IN THE MORNING AND AT BEDTIME. 12/17/21  Yes Adrian Prows, MD  ibuprofen (ADVIL) 200 MG tablet Take 200 mg by mouth every 6 (six) hours as needed.   Yes [provider]  levothyroxine (SYNTHROID) 112 MCG tablet Take 1 tablet (112 mcg total) by mouth daily before breakfast. 05/22/21  Yes Angiulli, Lavon Paganini, PA-C  losartan (COZAAR) 50 MG tablet Take 50 mg by mouth 2 (two) times daily.   Yes [provider]  Methoxy PEG-Epoetin Beta (MIRCERA IJ) Inject into the skin. 09/14/21  Yes [provider]  modafinil (PROVIGIL) 200 MG tablet Take 1 tablet (200 mg total) by mouth daily. 03/11/22  Yes Raulkar, Clide Deutscher, MD  OZEMPIC, 0.25 OR 0.5 MG/DOSE, 2 MG/3ML SOPN Inject 0.5 mg into the skin once a week. 09/19/21  Yes [provider]  pantoprazole (PROTONIX) 40 MG tablet TAKE 1 TABLET BY MOUTH EVERY DAY 06/04/22  Yes Irene Shipper, MD  pregabalin (LYRICA) 150 MG capsule Take 1 capsule by mouth in the morning and 2 capsules at night. Patient taking differently: Take 150 mg by mouth 3 (three) times daily. 05/22/21  Yes Angiulli, Lavon Paganini, PA-C  prochlorperazine (COMPAZINE) 5 MG tablet TAKE 1 TABLET BY MOUTH TWICE A DAY 09/06/21  Yes Irene Shipper, MD  rosuvastatin (CRESTOR) 10 MG tablet Take 1 tablet (10 mg total) by mouth daily. 05/22/21  Yes Angiulli, Lavon Paganini, PA-C  sevelamer carbonate (RENVELA) 800 MG tablet Take 1 tablet (800 mg total) by mouth 3 (three) times daily with meals. Patient taking differently: Take 800 mg by mouth daily. 05/22/21  Yes Angiulli, Lavon Paganini, PA-C  Vitamin D, Ergocalciferol, (DRISDOL) 1.25 MG (50000 UNIT) CAPS capsule Take 1 capsule (50,000 Units total) by mouth every 7 (seven) days. 03/11/22  Yes Raulkar, Clide Deutscher, MD  B Complex-C-Zn-Folic  Acid (DIALYVITE 800 WITH ZINC) 0.8 MG TABS Take 1 tablet by mouth daily. Patient not taking: Reported on 05/15/2022 03/13/22   Adrian Prows, MD  Darbepoetin Alfa (ARANESP) 100 MCG/0.5ML SOSY injection Inject 0.5 mLs (100 mcg total) into  the vein every Thursday with hemodialysis. Patient not taking: Reported on 06/20/2022 05/24/21   Angiulli, Lavon Paganini, PA-C  Insulin Pen Needle 32G X 4 MM MISC Use to inject insulin up to 4 times daily as needed. 05/22/21   Raulkar, Clide Deutscher, MD  methocarbamol (ROBAXIN) 500 MG tablet Take 1 tablet (500 mg total) by mouth 3 (three) times daily. Patient not taking: Reported on 05/15/2022 07/23/21   [name], MD    Physical Exam: Vitals:   06/20/22 1500 06/20/22 1530 06/20/22 1600 06/20/22 1652  BP: (!) 121/57 (!) 127/57 (!) 119/57 (!) 110/49  Pulse: 82 84 81 75  Resp: '19 18 17 '$ (!) 26  Temp:      TempSrc:      SpO2: 100% 100% 100% 93%  Weight:      Height:        Physical Exam Constitutional:      General: He is not in acute distress.    Appearance: Normal appearance.  HENT:     Head: Normocephalic and atraumatic.     Mouth/Throat:     Mouth: Mucous membranes are moist.     Pharynx: Oropharynx is clear.  Eyes:     Extraocular Movements: Extraocular movements intact.     Pupils: Pupils are equal, round, and reactive to light.  Cardiovascular:     Rate and Rhythm: Normal rate and regular rhythm.     Pulses: Normal pulses.     Heart sounds: Normal heart sounds.  Pulmonary:     Effort: Pulmonary effort is normal. No respiratory distress.     Breath sounds: Normal breath sounds.  Abdominal:     General: Bowel sounds are normal. There is no distension.     Palpations: Abdomen is soft.     Tenderness: There is no abdominal tenderness.  Musculoskeletal:        General: No swelling or deformity.  Skin:    General: Skin is warm and dry.     Coloration: Skin is pale.  Neurological:     General: No focal deficit present.     Mental Status: Mental status  is at baseline.    Labs on Admission: I have personally reviewed following labs and imaging studies  CBC: Recent Labs  Lab 06/20/22 1140  WBC 9.8  NEUTROABS 8.1*  HGB 4.9*  HCT 15.0*  MCV 95.5  PLT 116*    Basic Metabolic Panel: Recent Labs  Lab 06/20/22 1140  NA 132*  K 4.5  CL 89*  CO2 29  GLUCOSE 142*  BUN 58*  CREATININE 6.49*  CALCIUM 8.9    GFR: Estimated Creatinine Clearance: 11.7 mL/min (A) (by C-G formula based on SCr of 6.49 mg/dL (H)).  Liver Function Tests: Recent Labs  Lab 06/20/22 1140  AST 200*  ALT 168*  ALKPHOS 86  BILITOT 0.8  PROT 6.1*  ALBUMIN 4.1    Urine analysis:    Component Value Date/Time   COLORURINE YELLOW 08/19/2021 1240   APPEARANCEUR HAZY (A) 08/19/2021 1240   LABSPEC 1.016 08/19/2021 1240   PHURINE 6.0 08/19/2021 1240   GLUCOSEU NEGATIVE 08/19/2021 1240   HGBUR SMALL (A) 08/19/2021 1240   BILIRUBINUR NEGATIVE 08/19/2021 1240   KETONESUR NEGATIVE 08/19/2021 1240   PROTEINUR 100 (A) 08/19/2021 1240   UROBILINOGEN 0.2 12/28/2014 1031   NITRITE NEGATIVE 08/19/2021 1240   LEUKOCYTESUR NEGATIVE 08/19/2021 1240    Radiological Exams on Admission: CT ABDOMEN PELVIS WO CONTRAST  Result Date: 06/20/2022 CLINICAL DATA:  Intermittent abdominal  pain for 4 days. EXAM: CT ABDOMEN AND PELVIS WITHOUT CONTRAST TECHNIQUE: Multidetector CT imaging of the abdomen and pelvis was performed following the standard protocol without IV contrast. RADIATION DOSE REDUCTION: This exam was performed according to the departmental dose-optimization program which includes automated exposure control, adjustment of the mA and/or kV according to patient size and/or use of iterative reconstruction technique. COMPARISON:  05/05/2020 FINDINGS: Lower chest: Small left pleural effusion noted. Minimal overlying atelectasis. The heart is normal in size. No pericardial effusion. Aortic and coronary artery calcifications are noted. Hepatobiliary: Slightly irregular  liver contour and mildly enlarged caudate lobe may suggest changes of cirrhosis. No hepatic lesions are identified without contrast. No intrahepatic biliary dilatation. The gallbladder is surgically absent. No common bile duct dilatation. Pancreas: No mass, inflammation or ductal dilatation. Spleen: Normal size.  No focal lesions. Adrenals/Urinary Tract: Stable 3.8 x 3.3 cm right adrenal gland lesion containing macroscopic fat and consistent with a benign adrenal gland myelolipoma. The left adrenal gland is normal. Atrophic kidneys and patient on renal dialysis. Stable simple right renal cyst. This is not knee and further imaging evaluation or follow-up. The bladder is unremarkable. Stomach/Bowel: The stomach, duodenum, small and colon are grossly normal. No acute inflammatory process, mass lesions or obstructive findings. The terminal ileum and appendix are normal. The sigmoid colon diverticulosis but no findings for acute diverticulitis. Vascular/Lymphatic: Advanced atherosclerotic calcification involving the aorta, iliac arteries and branch vessels but no aneurysm. Stable scattered mesenteric and retroperitoneal lymph nodes but no mass or adenopathy. Reproductive: The prostate gland contains radiopaque markers possibly related to prostate cancer or UroLift procedure. The seminal vesicles are. Other: Small amount of free scattered abdominal fluid and moderate free pelvic fluid which appears simple. This could be related to the patient's kidney disease or liver disease. Musculoskeletal: No significant bony findings. IMPRESSION: 1. No acute abdominal/pelvic findings, mass lesions or adenopathy. 2. Small amount of free scattered abdominal fluid and moderate free pelvic fluid which appears simple. This could be related to the patient's kidney disease or liver disease. 3. Stable right adrenal gland myelolipoma. 4. Atrophic kidneys in patient on renal dialysis. 5. Small left pleural effusion with overlying atelectasis.  6. Slightly irregular liver contour and mildly enlarged caudate lobe may suggest changes of cirrhosis. 7. Status post cholecystectomy. No biliary dilatation. Aortic Atherosclerosis (ICD10-I70.0). Electronically Signed   By: Marijo Sanes M.D.   On: 06/20/2022 12:48   DG Chest Portable 1 View  Result Date: 06/20/2022 CLINICAL DATA:  72 year old male presents with cough. EXAM: PORTABLE CHEST 1 VIEW COMPARISON:  Aug 18, 2021 FINDINGS: Post median sternotomy for CABG with stable cardiomediastinal contours. Hilar structures are normal. Lungs are clear.  No visible pneumothorax. On limited assessment no acute skeletal process. IMPRESSION: Post median sternotomy for CABG without acute cardiopulmonary disease. Electronically Signed   By: Zetta Bills M.D.   On: 06/20/2022 12:39    EKG: Independently reviewed.  A-fib at 79 bpm.  Similar to previous.  Some baseline artifact.  Assessment/Plan Principal Problem:   Symptomatic anemia Active Problems:   ESRD on hemodialysis (HCC)   Paroxysmal atrial fibrillation (HCC)   CAD (coronary artery disease)   Insulin dependent type 2 diabetes mellitus ([place])   Hypertension associated with diabetes ([place])   Hyperlipidemia associated with type 2 diabetes mellitus ([place])   Gout   Hypothyroidism   Diabetic neuropathy ([place])   History of partial ray amputation of fourth toe of right foot (HCC)   Typical atrial flutter ([place])  Anemia of chronic renal failure   S/P CABG (coronary artery bypass graft)   Symptomatic anemia > Recent intermittent abdominal pain with melanotic stools with intermittent streaks of blood.  Reporting fatigue as well. > Some baseline anemia in the setting of ESRD.  Hemoglobin today found to be 4.9 from baseline of 8-10 a month ago. > Currently FOBT was negative however suspect intermittent GI bleeding given his above symptoms. > Complicating the picture: He is taking iron and recently had skin graft applied for his right lower extremity wound  during outpatient orthopedic visit and according to wife there was some significant bleeding.   > Transferred from med center, has not yet had type and screen. > Nephrology consulted and ED as patient will likely need multiple transfusions and may be best done with dialysis tomorrow. - Monitor on progressive unit - Appreciate input from nephrology - Will send a message to GI for consult in the morning - Stat type and screen and H&H - 1 unit transfusion to start with - Trend hemoglobin overnight - Full liquid diet for now  ESRD on HD > Dialysis on Monday Wednesday Friday.  Last session yesterday. > Nephrology consulted in the ED as he will likely need dialysis while he is here tomorrow.  Will also need to potentially time his transfusions. - Appreciate nephrology recommendations - Trend renal function and electrolytes - Continue home Renvela   Atrial flutter status post ablation Paroxysmal A-fib > Currently in A-fib in the ED - Continue home carvedilol - Not currently on anticoagulation  CAD > Status post CABG - Continue home carvedilol and rosuvastatin - Holding home losartan in the setting of low normal blood pressure and intermittent GI bleed suspicion as above  Diabetes Diabetic neuropathy - SSI - Continue home Lyrica  Hypothyroidism - Continue home Synthroid  Hypertension - Continue home Coreg - Holding home losartan in the setting of low normal blood pressure as above  Hyperlipidemia - Continue home rosuvastatin  Gout - Continue home allopurinol  GERD - Continue home PPI  Status post left BKA Status post right ray amputation - Noted  DVT prophylaxis: SCDs Code Status:   Full Family Communication:  Updated at bedside  Disposition Plan:   Patient is from:  Home  Anticipated DC to:  Home  Anticipated DC date:  1 to 3 days  Anticipated DC barriers: None  Consults called:  Nephrology, message sent to GI for consultation in the morning Admission status:   Observation, progressive  Severity of Illness: The appropriate patient status for this patient is OBSERVATION. Observation status is judged to be reasonable and necessary in order to provide the required intensity of service to ensure the patient's safety. The patient's presenting symptoms, physical exam findings, and initial radiographic and laboratory data in the context of their medical condition is felt to place them at decreased risk for further clinical deterioration. Furthermore, it is anticipated that the patient will be medically stable for discharge from the hospital within 2 midnights of admission.    Marcelyn Bruins MD [facility]  How to contact the [facility] Attending or Consulting provider [place] or covering provider during after hours [place], for this patient?   Check the care team in [facility] and look for a) attending/consulting [facility] provider listed and b) the [facility] team listed Log into [email or web address] and use [employer]'s universal password to access. If you do not have the password, please contact the hospital operator. Locate the [facility] provider you are  looking for under [facility] and page to a number that you can be directly reached. If you still have difficulty reaching the provider, please page the [facility] (Director on Call) for the Hospitalists listed on amion for assistance.  06/20/2022, 5:19 PM

## 2022-06-20 NOTE — ED Notes (Signed)
Pt states he is still unable to provide urine sample at this time.

## 2022-06-20 NOTE — ED Notes (Signed)
Lauren at [facility] will send transport as soon as one is available. Patient is going [facility] ED > [facility] ED.Dr. Delphina Cahill is accepting at [facility].-ABB(NS)

## 2022-06-20 NOTE — Plan of Care (Signed)

## 2022-06-20 NOTE — ED Triage Notes (Signed)
Pt arrives to ED with c/o intermittent generalized abdominal pains x4 days. Associated with nausea and melena.

## 2022-06-20 NOTE — ED Notes (Signed)
Pt placed on 2L O2 via [place] by EDP d/t SpO2 decreasing to 88% on RA, SpO2 has maintained WNL on [place].

## 2022-06-20 NOTE — ED Provider Notes (Signed)
[facility] Provider Note   CSN: [id number] Arrival date & time: 06/20/22  1129     History  Chief Complaint  Patient presents with   Abdominal Pain    [name] is a 72 y.o. male.  The history is provided by the patient and medical records. No language interpreter was used.  Abdominal Pain Pain location:  Generalized Pain quality: aching   Pain radiates to:  Does not radiate Pain severity:  Moderate Onset quality:  Gradual Timing:  Constant Progression:  Unchanged Chronicity:  New Relieved by:  Nothing Worsened by:  Nothing Ineffective treatments:  None tried Associated symptoms: chills, cough, fatigue, melena and nausea   Associated symptoms: no chest pain, no constipation, no diarrhea, no dysuria, no fever, no flatus, no shortness of breath and no vomiting        Home Medications Prior to Admission medications   Medication Sig Start Date End Date Taking? Authorizing Provider  amoxicillin-clavulanate (AUGMENTIN) 250-125 MG tablet Take 1 tablet by mouth 2 (two) times daily. 05/20/22   [name], MD  acetaminophen (TYLENOL) 500 MG tablet Take 1,000 mg by mouth every 8 (eight) hours as needed for mild pain or headache.    [provider]  allopurinol (ZYLOPRIM) 100 MG tablet Take 2 tablets (200 mg total) by mouth daily. 05/22/21   Angiulli, Lavon Paganini, PA-C  B Complex-C-Zn-Folic Acid (DIALYVITE Q000111Q WITH ZINC) 0.8 MG TABS Take 1 tablet by mouth daily. Patient not taking: Reported on 05/15/2022 03/13/22   Adrian Prows, MD  carvedilol (COREG) 12.5 MG tablet TAKE 1 TABLET BY MOUTH IN THE MORNING AND AT BEDTIME. 12/17/21   Adrian Prows, MD  Darbepoetin Alfa (ARANESP) 100 MCG/0.5ML SOSY injection Inject 0.5 mLs (100 mcg total) into the vein every Thursday with hemodialysis. 05/24/21   Angiulli, Lavon Paganini, PA-C  Insulin Pen Needle 32G X 4 MM MISC Use to inject insulin up to 4 times daily as needed. 05/22/21   Raulkar, Clide Deutscher, MD   levothyroxine (SYNTHROID) 112 MCG tablet Take 1 tablet (112 mcg total) by mouth daily before breakfast. 05/22/21   Angiulli, Lavon Paganini, PA-C  losartan (COZAAR) 50 MG tablet Take 50 mg by mouth 2 (two) times daily.    [provider]  methocarbamol (ROBAXIN) 500 MG tablet Take 1 tablet (500 mg total) by mouth 3 (three) times daily. Patient not taking: Reported on 05/15/2022 07/23/21   [name], MD  Methoxy PEG-Epoetin Beta (MIRCERA IJ) Inject into the skin. 09/14/21   [provider]  modafinil (PROVIGIL) 200 MG tablet Take 1 tablet (200 mg total) by mouth daily. 03/11/22   Raulkar, Clide Deutscher, MD  OZEMPIC, 0.25 OR 0.5 MG/DOSE, 2 MG/3ML SOPN Inject 0.5 mg into the skin once a week. 09/19/21   [provider]  pantoprazole (PROTONIX) 40 MG tablet TAKE 1 TABLET BY MOUTH EVERY DAY 06/04/22   Irene Shipper, MD  Polyethyl Glycol-Propyl Glycol (LUBRICANT EYE DROPS) 0.4-0.3 % SOLN Place 1-2 drops into both eyes in the morning and at bedtime.    [provider]  pregabalin (LYRICA) 150 MG capsule Take 1 capsule by mouth in the morning and 2 capsules at night. Patient taking differently: Take 150 mg by mouth 3 (three) times daily. 05/22/21   Angiulli, Lavon Paganini, PA-C  prochlorperazine (COMPAZINE) 5 MG tablet TAKE 1 TABLET BY MOUTH TWICE A DAY Patient not taking: Reported on 05/15/2022 09/06/21   Irene Shipper, MD  rosuvastatin (CRESTOR) 10 MG tablet Take 1 tablet (10 mg total) by mouth daily. 05/22/21   Angiulli, Lavon Paganini, PA-C  sevelamer carbonate (RENVELA) 800 MG tablet Take 1 tablet (800 mg total) by mouth 3 (three) times daily with meals. Patient taking differently: Take 800 mg by mouth daily. 05/22/21   Angiulli, Lavon Paganini, PA-C  sulfamethoxazole-trimethoprim (BACTRIM DS) 800-160 MG tablet Take 1 tablet by mouth 2 (two) times daily. 05/18/22   [name], MD  Vitamin D, Ergocalciferol, (DRISDOL) 1.25 MG (50000 UNIT) CAPS capsule Take 1 capsule (50,000 Units total) by mouth  every 7 (seven) days. 03/11/22   Raulkar, Clide Deutscher, MD      Allergies    Morphine and Mushroom extract complex    Review of Systems   Review of Systems  Constitutional:  Positive for chills and fatigue. Negative for fever.  HENT:  Negative for congestion.   Respiratory:  Positive for cough. Negative for chest tightness and shortness of breath.   Cardiovascular:  Negative for chest pain.  Gastrointestinal:  Positive for abdominal pain, melena and nausea. Negative for constipation, diarrhea, flatus and vomiting.  Genitourinary:  Negative for dysuria, flank pain and frequency.  Musculoskeletal:  Negative for back pain, neck pain and neck stiffness.  Skin:  Negative for rash and wound.  Neurological:  Positive for light-headedness. Negative for headaches.  Psychiatric/Behavioral:  Negative for agitation and behavioral problems.     Physical Exam Updated Vital Signs BP (!) 93/43 (BP Location: Right Arm)   Pulse 82   Temp 99.3 F (37.4 C) (Oral)   Resp 17   Ht '5\' 10"'$  (1.778 m)   Wt 90.7 kg   SpO2 98%   BMI 28.70 kg/m  Physical Exam Vitals and nursing note reviewed.  Constitutional:      General: He is not in acute distress.    Appearance: He is well-developed. He is not ill-appearing, toxic-appearing or diaphoretic.  HENT:     Head: Normocephalic and atraumatic.     Mouth/Throat:     Mouth: Mucous membranes are dry.     Pharynx: No oropharyngeal exudate or posterior oropharyngeal erythema.  Eyes:     Extraocular Movements: Extraocular movements intact.     Conjunctiva/sclera: Conjunctivae normal.     Pupils: Pupils are equal, round, and reactive to light.  Cardiovascular:     Rate and Rhythm: Normal rate and regular rhythm.     Heart sounds: No murmur heard. Pulmonary:     Effort: Pulmonary effort is normal. No respiratory distress.     Breath sounds: Normal breath sounds. No rhonchi or rales.  Abdominal:     General: Abdomen is flat. Bowel sounds are normal. There is  no distension.     Palpations: Abdomen is soft.     Tenderness: There is no abdominal tenderness. There is no right CVA tenderness or left CVA tenderness.  Genitourinary:    Rectum: Guaiac result negative.  Musculoskeletal:        General: No swelling.     Cervical back: Neck supple. No tenderness.  Skin:    General: Skin is warm and dry.     Capillary Refill: Capillary refill takes less than 2 seconds.     Coloration: Skin is pale.     Findings: No erythema or rash.  Neurological:     General: No focal deficit present.     Mental Status: He is alert.     Sensory: No sensory deficit.     Motor: No  weakness.  Psychiatric:        Mood and Affect: Mood normal.     ED Results / Procedures / Treatments   Labs (all labs ordered are listed, but only abnormal results are displayed) Labs Reviewed  LIPASE, BLOOD - Abnormal; Notable for the following components:      Result Value   Lipase 147 (*)    All other components within normal limits  COMPREHENSIVE METABOLIC PANEL - Abnormal; Notable for the following components:   Sodium 132 (*)    Chloride 89 (*)    Glucose, Bld 142 (*)    BUN 58 (*)    Creatinine, Ser 6.49 (*)    Total Protein 6.1 (*)    AST 200 (*)    ALT 168 (*)    GFR, Estimated 8 (*)    All other components within normal limits  CBC WITH DIFFERENTIAL/PLATELET - Abnormal; Notable for the following components:   RBC 1.57 (*)    Hemoglobin 4.9 (*)    HCT 15.0 (*)    RDW 21.5 (*)    Platelets 116 (*)    nRBC 0.6 (*)    Neutro Abs 8.1 (*)    Lymphs Abs 0.4 (*)    Abs Immature Granulocytes 0.39 (*)    All other components within normal limits  PROTIME-INR - Abnormal; Notable for the following components:   Prothrombin Time 17.6 (*)    INR 1.5 (*)    All other components within normal limits  CBG MONITORING, ED - Abnormal; Notable for the following components:   Glucose-Capillary 136 (*)    All other components within normal limits  RESP PANEL BY RT-PCR (RSV,  FLU A&B, COVID)  [id number]  OCCULT BLOOD X 1 CARD TO LAB, STOOL  TSH  URINALYSIS, ROUTINE W REFLEX MICROSCOPIC    EKG EKG Interpretation  Date/Time:  Thursday June 20 2022 12:09:05 EDT Ventricular Rate:  79 PR Interval:    QRS Duration: 99 QT Interval:  389 QTC Calculation: 446 R Axis:   83 Text Interpretation: similar appearing afib Borderline right axis deviation Nonspecific repol abnormality, diffuse leads when compared to prior, similar appearnce.  more artifact. No STEMI Confirmed by Antony Blackbird [id number]) on 06/20/2022 12:40:39 PM  Radiology CT ABDOMEN PELVIS WO CONTRAST  Result Date: 06/20/2022 CLINICAL DATA:  Intermittent abdominal pain for 4 days. EXAM: CT ABDOMEN AND PELVIS WITHOUT CONTRAST TECHNIQUE: Multidetector CT imaging of the abdomen and pelvis was performed following the standard protocol without IV contrast. RADIATION DOSE REDUCTION: This exam was performed according to the departmental dose-optimization program which includes automated exposure control, adjustment of the mA and/or kV according to patient size and/or use of iterative reconstruction technique. COMPARISON:  05/05/2020 FINDINGS: Lower chest: Small left pleural effusion noted. Minimal overlying atelectasis. The heart is normal in size. No pericardial effusion. Aortic and coronary artery calcifications are noted. Hepatobiliary: Slightly irregular liver contour and mildly enlarged caudate lobe may suggest changes of cirrhosis. No hepatic lesions are identified without contrast. No intrahepatic biliary dilatation. The gallbladder is surgically absent. No common bile duct dilatation. Pancreas: No mass, inflammation or ductal dilatation. Spleen: Normal size.  No focal lesions. Adrenals/Urinary Tract: Stable 3.8 x 3.3 cm right adrenal gland lesion containing macroscopic fat and consistent with a benign adrenal gland myelolipoma. The left adrenal gland is normal. Atrophic kidneys and patient on renal dialysis. Stable  simple right renal cyst. This is not knee and further imaging evaluation or follow-up. The bladder is unremarkable. Stomach/Bowel: The stomach,  duodenum, small and colon are grossly normal. No acute inflammatory process, mass lesions or obstructive findings. The terminal ileum and appendix are normal. The sigmoid colon diverticulosis but no findings for acute diverticulitis. Vascular/Lymphatic: Advanced atherosclerotic calcification involving the aorta, iliac arteries and branch vessels but no aneurysm. Stable scattered mesenteric and retroperitoneal lymph nodes but no mass or adenopathy. Reproductive: The prostate gland contains radiopaque markers possibly related to prostate cancer or UroLift procedure. The seminal vesicles are. Other: Small amount of free scattered abdominal fluid and moderate free pelvic fluid which appears simple. This could be related to the patient's kidney disease or liver disease. Musculoskeletal: No significant bony findings. IMPRESSION: 1. No acute abdominal/pelvic findings, mass lesions or adenopathy. 2. Small amount of free scattered abdominal fluid and moderate free pelvic fluid which appears simple. This could be related to the patient's kidney disease or liver disease. 3. Stable right adrenal gland myelolipoma. 4. Atrophic kidneys in patient on renal dialysis. 5. Small left pleural effusion with overlying atelectasis. 6. Slightly irregular liver contour and mildly enlarged caudate lobe may suggest changes of cirrhosis. 7. Status post cholecystectomy. No biliary dilatation. Aortic Atherosclerosis (ICD10-I70.0). Electronically Signed   By: Marijo Sanes M.D.   On: 06/20/2022 12:48   DG Chest Portable 1 View  Result Date: 06/20/2022 CLINICAL DATA:  72 year old male presents with cough. EXAM: PORTABLE CHEST 1 VIEW COMPARISON:  Aug 18, 2021 FINDINGS: Post median sternotomy for CABG with stable cardiomediastinal contours. Hilar structures are normal. Lungs are clear.  No visible  pneumothorax. On limited assessment no acute skeletal process. IMPRESSION: Post median sternotomy for CABG without acute cardiopulmonary disease. Electronically Signed   By: Zetta Bills M.D.   On: 06/20/2022 12:39    Procedures Procedures    CRITICAL CARE Performed by: Gwenyth Allegra Shandiin Eisenbeis Total critical care time: 35 minutes Critical care time was exclusive of separately billable procedures and treating other patients. Critical care was necessary to treat or prevent imminent or life-threatening deterioration. Critical care was time spent personally by me on the following activities: development of treatment plan with patient and/or surrogate as well as nursing, discussions with consultants, evaluation of patient's response to treatment, examination of patient, obtaining history from patient or surrogate, ordering and performing treatments and interventions, ordering and review of laboratory studies, ordering and review of radiographic studies, pulse oximetry and re-evaluation of patient's condition.  Medications Ordered in ED Medications - No data to display  ED Course/ Medical Decision Making/ A&P                             Medical Decision Making Amount and/or Complexity of Data Reviewed Labs: ordered. Radiology: ordered.    [name] is a 72 y.o. male with a past medical history significant for hypertension, hyperlipidemia, CAD, diabetes, ESRD on dialysis MWF on home dialysis, atrial flutter not on anticoagulation, previous ulceration and GI bleed, previous cholecystectomy, prostate cancer, and previous left leg amputation and right foot ulceration who presents with 1 week of worsening fatigue, pallor, bloody appearing stools, cough, and abdominal pain.  According to patient family, for the last week or so patient has had some discomfort in his abdomen that waxes and wanes in severity as well as dark and bloody stools.  He reports is primarily dark there is occasional bright  blood.  He reports some nausea but no vomiting.  He reports some fevers and he always has chills.  He reports he has  had some cough that is nonproductive.  Denies chest pain or shortness of breath.  Reports abdominal pain is moderate and denies any trauma.  He reports he had no complications or problems with his dialysis.  He is having no energy and fatigue and not eating or drinking as much.  He does still make urine.  On exam, lungs clear.  Chest nontender.  Abdomen was nontender on my exam and bowel sounds were appreciated.  Back and flanks nontender.  Patient appears pale with pale mucous membranes on exam.  No focal neurologic deficits.  Patient has the left leg amputation and right bracing and wrapping on the leg.  He reports no new troubles in his legs at this time.  Clinically I am somewhat concerned about symptomatic anemia with GI bleed.  He reports using [facility] in the past.  On my rectal exam, he had no gross blood or rectal tenderness.  Fecal occult was negative however given the dark stools I still am concerned about possible bleed so this will need to be followed.  Patient will have screening lab work, will get CT without contrast of the abdomen pelvis to rule out acute diverticulitis or perforation or ulceration.  Will get chest x-ray and viral testing given his chills and cough.  Laboratory testing revealed he does have significant anemia with hemoglobin of 4.9 which appears dropped compared to last month.  He had similar elevated creatinine given his dialysis   but his AST and ALT are also more elevated than prior.  Will await noncontrasted CT scan to look for acute abnormalities.  Last x-ray shows evidence of previous CABG but otherwise does not reveal pneumonia.  After CT abdomen pelvis is finalized, will call for admission for blood transfusion and further management of symptomatic anemia.  1:04 PM CT scan returned without acute abnormality.  Did show some free fluid likely  related to fluid just on dialysis.  Also showed effusion but x-ray did not show pneumonia.  Fecal occult test was negative however there was not a large sample so unclear if there truly is a GI bleed or not.  Will call for admission to [facility] as he will likely need dialysis during his stay.  Will admit for symptomatic anemia so he can get blood transfusion.  1:48 PM They reported bed will not be ready for some time for progressive bed.  Hospitalist recommended ED to ED transfer to get a unit of blood in the ED while waiting.  I spoke to Dr. Alvino Chapel who accept the patient for transfer.  Patient will go ED to ED to await his admitting bed.  Will also discuss with nephrology to let them know he is coming.        Final Clinical Impression(s) / ED Diagnoses Final diagnoses:  Symptomatic anemia      Clinical Impression: 1. Symptomatic anemia     Disposition: Admit  This note was prepared with assistance of Dragon voice recognition software. Occasional wrong-word or sound-a-like substitutions may have occurred due to the inherent limitations of voice recognition software.      Maida Widger, Gwenyth Allegra, MD 06/20/22 1351

## 2022-06-21 DIAGNOSIS — D696 Thrombocytopenia, unspecified: Secondary | ICD-10-CM | POA: Diagnosis present

## 2022-06-21 DIAGNOSIS — D123 Benign neoplasm of transverse colon: Secondary | ICD-10-CM | POA: Diagnosis not present

## 2022-06-21 DIAGNOSIS — D649 Anemia, unspecified: Secondary | ICD-10-CM | POA: Diagnosis present

## 2022-06-21 DIAGNOSIS — N2581 Secondary hyperparathyroidism of renal origin: Secondary | ICD-10-CM | POA: Diagnosis present

## 2022-06-21 DIAGNOSIS — I120 Hypertensive chronic kidney disease with stage 5 chronic kidney disease or end stage renal disease: Secondary | ICD-10-CM

## 2022-06-21 DIAGNOSIS — I2510 Atherosclerotic heart disease of native coronary artery without angina pectoris: Secondary | ICD-10-CM | POA: Diagnosis not present

## 2022-06-21 DIAGNOSIS — N186 End stage renal disease: Secondary | ICD-10-CM | POA: Diagnosis present

## 2022-06-21 DIAGNOSIS — I480 Paroxysmal atrial fibrillation: Secondary | ICD-10-CM | POA: Diagnosis present

## 2022-06-21 DIAGNOSIS — E1169 Type 2 diabetes mellitus with other specified complication: Secondary | ICD-10-CM | POA: Diagnosis present

## 2022-06-21 DIAGNOSIS — D120 Benign neoplasm of cecum: Secondary | ICD-10-CM | POA: Diagnosis not present

## 2022-06-21 DIAGNOSIS — Z89512 Acquired absence of left leg below knee: Secondary | ICD-10-CM | POA: Diagnosis not present

## 2022-06-21 DIAGNOSIS — D500 Iron deficiency anemia secondary to blood loss (chronic): Secondary | ICD-10-CM | POA: Diagnosis not present

## 2022-06-21 DIAGNOSIS — Z992 Dependence on renal dialysis: Secondary | ICD-10-CM | POA: Diagnosis not present

## 2022-06-21 DIAGNOSIS — K921 Melena: Secondary | ICD-10-CM

## 2022-06-21 DIAGNOSIS — D132 Benign neoplasm of duodenum: Secondary | ICD-10-CM | POA: Diagnosis not present

## 2022-06-21 DIAGNOSIS — Z7985 Long-term (current) use of injectable non-insulin antidiabetic drugs: Secondary | ICD-10-CM | POA: Diagnosis not present

## 2022-06-21 DIAGNOSIS — K635 Polyp of colon: Secondary | ICD-10-CM | POA: Diagnosis not present

## 2022-06-21 DIAGNOSIS — C61 Malignant neoplasm of prostate: Secondary | ICD-10-CM

## 2022-06-21 DIAGNOSIS — Z1152 Encounter for screening for COVID-19: Secondary | ICD-10-CM | POA: Diagnosis not present

## 2022-06-21 DIAGNOSIS — K5790 Diverticulosis of intestine, part unspecified, without perforation or abscess without bleeding: Secondary | ICD-10-CM | POA: Diagnosis not present

## 2022-06-21 DIAGNOSIS — D631 Anemia in chronic kidney disease: Secondary | ICD-10-CM | POA: Diagnosis present

## 2022-06-21 DIAGNOSIS — Z79899 Other long term (current) drug therapy: Secondary | ICD-10-CM | POA: Diagnosis not present

## 2022-06-21 DIAGNOSIS — K317 Polyp of stomach and duodenum: Secondary | ICD-10-CM | POA: Diagnosis not present

## 2022-06-21 DIAGNOSIS — E1141 Type 2 diabetes mellitus with diabetic mononeuropathy: Secondary | ICD-10-CM | POA: Diagnosis present

## 2022-06-21 DIAGNOSIS — E785 Hyperlipidemia, unspecified: Secondary | ICD-10-CM | POA: Diagnosis not present

## 2022-06-21 DIAGNOSIS — Z794 Long term (current) use of insulin: Secondary | ICD-10-CM | POA: Diagnosis not present

## 2022-06-21 DIAGNOSIS — D62 Acute posthemorrhagic anemia: Secondary | ICD-10-CM

## 2022-06-21 DIAGNOSIS — M1A9XX0 Chronic gout, unspecified, without tophus (tophi): Secondary | ICD-10-CM

## 2022-06-21 DIAGNOSIS — E0590 Thyrotoxicosis, unspecified without thyrotoxic crisis or storm: Secondary | ICD-10-CM

## 2022-06-21 DIAGNOSIS — K5520 Angiodysplasia of colon without hemorrhage: Secondary | ICD-10-CM | POA: Diagnosis not present

## 2022-06-21 DIAGNOSIS — E1122 Type 2 diabetes mellitus with diabetic chronic kidney disease: Secondary | ICD-10-CM | POA: Diagnosis present

## 2022-06-21 DIAGNOSIS — Z89411 Acquired absence of right great toe: Secondary | ICD-10-CM

## 2022-06-21 DIAGNOSIS — I739 Peripheral vascular disease, unspecified: Secondary | ICD-10-CM

## 2022-06-21 DIAGNOSIS — M109 Gout, unspecified: Secondary | ICD-10-CM | POA: Diagnosis present

## 2022-06-21 DIAGNOSIS — K31819 Angiodysplasia of stomach and duodenum without bleeding: Secondary | ICD-10-CM | POA: Diagnosis not present

## 2022-06-21 DIAGNOSIS — K2289 Other specified disease of esophagus: Secondary | ICD-10-CM | POA: Diagnosis not present

## 2022-06-21 DIAGNOSIS — I4892 Unspecified atrial flutter: Secondary | ICD-10-CM | POA: Diagnosis present

## 2022-06-21 DIAGNOSIS — D124 Benign neoplasm of descending colon: Secondary | ICD-10-CM | POA: Diagnosis not present

## 2022-06-21 DIAGNOSIS — E039 Hypothyroidism, unspecified: Secondary | ICD-10-CM | POA: Diagnosis present

## 2022-06-21 DIAGNOSIS — I25810 Atherosclerosis of coronary artery bypass graft(s) without angina pectoris: Secondary | ICD-10-CM

## 2022-06-21 DIAGNOSIS — Z87891 Personal history of nicotine dependence: Secondary | ICD-10-CM | POA: Diagnosis not present

## 2022-06-21 DIAGNOSIS — K219 Gastro-esophageal reflux disease without esophagitis: Secondary | ICD-10-CM | POA: Diagnosis not present

## 2022-06-21 DIAGNOSIS — K3189 Other diseases of stomach and duodenum: Secondary | ICD-10-CM | POA: Diagnosis not present

## 2022-06-21 DIAGNOSIS — Z79890 Hormone replacement therapy (postmenopausal): Secondary | ICD-10-CM | POA: Diagnosis not present

## 2022-06-21 DIAGNOSIS — E1151 Type 2 diabetes mellitus with diabetic peripheral angiopathy without gangrene: Secondary | ICD-10-CM | POA: Diagnosis present

## 2022-06-21 DIAGNOSIS — K31811 Angiodysplasia of stomach and duodenum with bleeding: Secondary | ICD-10-CM | POA: Diagnosis present

## 2022-06-21 DIAGNOSIS — E7800 Pure hypercholesterolemia, unspecified: Secondary | ICD-10-CM | POA: Diagnosis present

## 2022-06-21 LAB — COMPREHENSIVE METABOLIC PANEL
ALT: 178 U/L — ABNORMAL HIGH (ref 0–44)
AST: 172 U/L — ABNORMAL HIGH (ref 15–41)
Albumin: 3.1 g/dL — ABNORMAL LOW (ref 3.5–5.0)
Alkaline Phosphatase: 94 U/L (ref 38–126)
Anion gap: 14 (ref 5–15)
BUN: 65 mg/dL — ABNORMAL HIGH (ref 8–23)
CO2: 27 mmol/L (ref 22–32)
Calcium: 8 mg/dL — ABNORMAL LOW (ref 8.9–10.3)
Chloride: 92 mmol/L — ABNORMAL LOW (ref 98–111)
Creatinine, Ser: 7.81 mg/dL — ABNORMAL HIGH (ref 0.61–1.24)
GFR, Estimated: 7 mL/min — ABNORMAL LOW (ref >60)
Glucose, Bld: 95 mg/dL (ref 70–99)
Potassium: 4.4 mmol/L (ref 3.5–5.1)
Sodium: 133 mmol/L — ABNORMAL LOW (ref 135–145)
Total Bilirubin: 0.9 mg/dL (ref 0.3–1.2)
Total Protein: 5.4 g/dL — ABNORMAL LOW (ref 6.5–8.1)

## 2022-06-21 LAB — GLUCOSE, CAPILLARY
Glucose-Capillary: 169 mg/dL — ABNORMAL HIGH (ref 70–99)
Glucose-Capillary: 164 mg/dL — ABNORMAL HIGH (ref 70–99)
Glucose-Capillary: 181 mg/dL — ABNORMAL HIGH (ref 70–99)

## 2022-06-21 LAB — CBC
HCT: 16.6 % — ABNORMAL LOW (ref 39.0–52.0)
HCT: 21.8 % — ABNORMAL LOW (ref 39.0–52.0)
Hemoglobin: 5.5 g/dL — CL (ref 13.0–17.0)
Hemoglobin: 7.4 g/dL — ABNORMAL LOW (ref 13.0–17.0)
MCH: 31.8 pg (ref 26.0–34.0)
MCH: 31.6 pg (ref 26.0–34.0)
MCHC: 33.1 g/dL (ref 30.0–36.0)
MCHC: 33.9 g/dL (ref 30.0–36.0)
MCV: 96 fL (ref 80.0–100.0)
MCV: 93.2 fL (ref 80.0–100.0)
Platelets: 97 10*3/uL — ABNORMAL LOW (ref 150–400)
Platelets: 81 10*3/uL — ABNORMAL LOW (ref 150–400)
RBC: 1.73 MIL/uL — ABNORMAL LOW (ref 4.22–5.81)
RBC: 2.34 MIL/uL — ABNORMAL LOW (ref 4.22–5.81)
RDW: 20 % — ABNORMAL HIGH (ref 11.5–15.5)
RDW: 18.8 % — ABNORMAL HIGH (ref 11.5–15.5)
WBC: 6.7 10*3/uL (ref 4.0–10.5)
WBC: 5.9 10*3/uL (ref 4.0–10.5)
nRBC: 0.3 % — ABNORMAL HIGH (ref 0.0–0.2)
nRBC: 0 % (ref 0.0–0.2)

## 2022-06-21 LAB — HEPATITIS B SURFACE ANTIGEN: Hepatitis B Surface Ag: NONREACTIVE

## 2022-06-21 LAB — PREPARE RBC (CROSSMATCH)

## 2022-06-21 MED ORDER — SODIUM CHLORIDE 0.9% IV SOLUTION: Freq: Once | INTRAVENOUS | Status: AC

## 2022-06-21 MED ORDER — LIDOCAINE-PRILOCAINE 2.5-2.5 % EX CREA: 1.0000 | TOPICAL_CREAM | CUTANEOUS | Status: DC | PRN

## 2022-06-21 MED ORDER — NEPRO/CARBSTEADY PO LIQD
237.0000 mL | ORAL | Status: DC
  Administered 2022-06-22: 237 mL via ORAL

## 2022-06-21 MED ORDER — TRAZODONE HCL 50 MG PO TABS
25.0000 mg | ORAL_TABLET | Freq: Every evening | ORAL | Status: DC | PRN
  Administered 2022-06-21 – 2022-06-22 (×2): 25 mg via ORAL
  Filled 2022-06-21 (×2): qty 1

## 2022-06-21 MED ORDER — PENTAFLUOROPROP-TETRAFLUOROETH EX AERO: 1.0000 | INHALATION_SPRAY | CUTANEOUS | Status: DC | PRN

## 2022-06-21 MED ORDER — SODIUM CHLORIDE 0.9 % IV SOLN
125.0000 mg | INTRAVENOUS | Status: DC
  Administered 2022-06-21: 125 mg via INTRAVENOUS
  Filled 2022-06-21: qty 10

## 2022-06-21 MED ORDER — FENTANYL CITRATE PF 50 MCG/ML IJ SOSY: 12.5000 ug | PREFILLED_SYRINGE | INTRAMUSCULAR | Status: DC | PRN

## 2022-06-21 MED ORDER — SEVELAMER CARBONATE 800 MG PO TABS
800.0000 mg | ORAL_TABLET | Freq: Three times a day (TID) | ORAL | Status: DC
  Administered 2022-06-22 – 2022-06-23 (×4): 800 mg via ORAL
  Filled 2022-06-21 (×4): qty 1

## 2022-06-21 MED ORDER — NEPRO/CARBSTEADY PO LIQD: 237.0000 mL | ORAL | Status: DC | PRN

## 2022-06-21 MED ORDER — LIDOCAINE HCL (PF) 1 % IJ SOLN: 5.0000 mL | INTRAMUSCULAR | Status: DC | PRN

## 2022-06-21 MED ORDER — CALCITRIOL 0.25 MCG PO CAPS
0.5000 ug | ORAL_CAPSULE | Freq: Every day | ORAL | Status: DC
  Administered 2022-06-21 – 2022-06-23 (×3): 0.5 ug via ORAL
  Filled 2022-06-21 (×3): qty 2

## 2022-06-21 MED ORDER — SODIUM CHLORIDE 0.9 % IV SOLN: INTRAVENOUS | Status: DC

## 2022-06-21 MED ORDER — TRAMADOL HCL 50 MG PO TABS
50.0000 mg | ORAL_TABLET | Freq: Four times a day (QID) | ORAL | Status: DC | PRN
  Administered 2022-06-21 – 2022-06-22 (×2): 50 mg via ORAL
  Filled 2022-06-21 (×2): qty 1

## 2022-06-21 MED ORDER — SODIUM CHLORIDE 0.9% IV SOLUTION: Freq: Once | INTRAVENOUS | Status: DC

## 2022-06-21 NOTE — Progress Notes (Signed)
Received patient in bed to unit.  Alert and oriented.  Informed consent signed and in chart.   TX duration:3.75  Patient tolerated well.  Transported back to the room  Alert, without acute distress.  Hand-off given to patient's nurse.   Access used: RIGHT AVF Access issues: NONE  Total UF removed: 2.5L Medication(s) given: FERRIC GLUCANATE. 1 UNIT PRBC    06/21/22 1818  Vitals  Temp 97.7 F (36.5 C)  Temp Source Oral  BP (!) 141/62  MAP (mmHg) 82  BP Location Right Arm  BP Method Automatic  Patient Position (if appropriate) Lying  Pulse Rate 65  Pulse Rate Source Monitor  ECG Heart Rate 63  Resp 20  Oxygen Therapy  SpO2 100 %  O2 Device Room Air  During Treatment Monitoring  HD Safety Checks Performed Yes  Intra-Hemodialysis Comments Tx completed;Tolerated well  Dialysis Fluid Bolus Normal Saline  Bolus Amount (mL) 300 mL  Fistula / Graft Left Upper arm Arteriovenous fistula  No placement date or time found.   Orientation: Left  Access Location: Upper arm  Access Type: Arteriovenous fistula  Status Flushed;Deaccessed      Deborah Dondero [facility]

## 2022-06-21 NOTE — Consult Note (Addendum)
[facility] Renal Consultation Note    Indication for Consultation:  Management of ESRD/hemodialysis; anemia, hypertension/volume and secondary hyperparathyroidism PCP: Henri Medal FNP Nephrologist: Dr. Madelon Lips  HPI: [name] is a 72 y.o. male with ESRD on home hemodialysis W,F, Sunday through [facility]. Dr. Madelon Lips is managing nephrologist. PMH: DMT2, HTN, CAD S/P CABG, AFlutter not on anticoagulation, hypothyroidism, gout, PAD S/P L BKA, SHPT, anemia of ESRD.   Patient presented to ED with C/O fatigue, intermittent abdominal pain, intermittent melena. HGB on admission 4.9. FOBT negative. CT of ab/pelvis No acute abdominal/pelvic findings, mass lesions or adenopathy. CXR with post medial sternotomy otherwise unremarkable.  He has received 3 units PRBCs since admission. GI has been consulted with plans for EGD 06/22/2022. Most recent HGB 5.5. Chemistry unremarkable for ESRD patient. W/U per primary/GI.   Seen in room today. He says he feels "dried out", dehydrated. He denies abdominal pain, SOB, fever, chills, N, V,D. His chief concern is that he uses [facility] for home HD and wishes to do the same here. Will order HD today and transfuse 2 units PRBCS while on HD.   Past Medical History:  Diagnosis Date   Acute osteomyelitis of metatarsal bone of left foot ([place])    Ambulates with cane    straight cane   Anemia    Cervical myelopathy (HCC) 02/06/2018   Chronic kidney disease    dailysis M W F- home   Community acquired pneumonia of left lower lobe of lung [id number]   Complication of anesthesia    Coronary artery disease    Dehiscence of amputation stump of left lower extremity (HCC)    Diabetes mellitus without complication ([place])    type2   Diabetic foot ulcer ([place])    Diabetic neuropathy ([place]) 02/06/2018   Dysrhythmia    Gait abnormality 08/24/2018   GERD (gastroesophageal reflux disease)     01/06/20- not current    Gout    History of blood transfusion    History of blood transfusion    History of kidney stones    passed stones   Hypercholesteremia    Hypertension    Hypothyroidism    Neuromuscular disorder (HCC)    neuropathy left leg and bilateral feet   Neuropathy    Partial nontraumatic amputation of foot, left ([place]) 02/20/2021   PONV (postoperative nausea and vomiting)    Prostate cancer ([place])    PVD (peripheral vascular disease) ([place])    with amputations   Past Surgical History:  Procedure Laterality Date   A-FLUTTER ABLATION N/A 04/06/2020   Procedure: A-FLUTTER ABLATION;  Surgeon: [name], MD;  Location: [facility];  Service: Cardiovascular;  Laterality: N/A;   AMPUTATION Left 12/25/2013   Procedure: AMPUTATION RAY LEFT 5TH RAY;  Surgeon: [name], MD;  Location: [facility];  Service: Orthopedics;  Laterality: Left;   AMPUTATION Right 12/15/2018   Procedure: AMPUTATION OF 4TH AND 5TH TOES RIGHT FOOT;  Surgeon: [name], MD;  Location: [facility];  Service: Orthopedics;  Laterality: Right;   AMPUTATION Left 02/02/2021   Procedure: LEFT FOOT 4TH RAY AMPUTATION;  Surgeon: [name], MD;  Location: [facility];  Service: Orthopedics;  Laterality: Left;   AMPUTATION Left 05/09/2021   Procedure: LEFT BELOW KNEE AMPUTATION;  Surgeon: [name], MD;  Location: [facility];  Service: Orthopedics;  Laterality: Left;   APPLICATION OF WOUND VAC Right 12/15/2018   Procedure: APPLICATION OF WOUND VAC;  Surgeon: [name], MD;  Location: [facility];  Service: Orthopedics;  Laterality: Right;   AV FISTULA PLACEMENT Left 03/25/2019   Procedure: LEFT ARM ARTERIOVENOUS (AV) FISTULA CREATION;  Surgeon: [name], MD;  Location: [facility];  Service: Vascular;  Laterality: Left;   [place] Left 05/20/2019   Procedure: SECOND STAGE LEFT BASCILIC VEIN TRANSPOSITION;  Surgeon: [name], MD;  Location: [facility];  Service: Vascular;  Laterality: Left;    CARDIAC CATHETERIZATION  02/17/2014   CHOLECYSTECTOMY     COLONOSCOPY  2011   in [place], [place]  2008   CYSTOSCOPY N/A 06/29/2020   Procedure: [place];  Surgeon: [name], MD;  Location: [facility];  Service: Urology;  Laterality: N/A;   CYSTOSCOPY WITH FULGERATION Bilateral 06/17/2020   Procedure: CYSTOSCOPY,BILATERAL RETROGRADE, CLOT EVACUATION WITH FULGERATION OF THE BLADDER;  Surgeon: Janith Lima, MD;  Location: [facility];  Service: Urology;  Laterality: Bilateral;   CYSTOSCOPY WITH FULGERATION N/A 06/28/2020   Procedure: CYSTOSCOPY WITH CLOT EVACUATION AND FULGERATION OF BLEEDERS;  Surgeon: [name], MD;  Location: [facility];  Service: Urology;  Laterality: N/A;  1 HR   I & D EXTREMITY Right 12/15/2018   Procedure: DEBRIDEMENT RIGHT FOOT;  Surgeon: [name], MD;  Location: [facility];  Service: Orthopedics;  Laterality: Right;   I & D EXTREMITY Right 08/27/2019   Procedure: PARTIAL CUBOID EXCISION RIGHT FOOT;  Surgeon: [name], MD;  Location: [facility];  Service: Orthopedics;  Laterality: Right;   I & D EXTREMITY Right 01/07/2020   Procedure: RIGHT FOOT EXCISION INFECTED BONE;  Surgeon: [name], MD;  Location: [facility];  Service: Orthopedics;  Laterality: Right;   I & D EXTREMITY Right 05/17/2022   Procedure: IRRIGATION AND DEBRIDEMENT RIGHT FOOT ABSCESS;  Surgeon: [name], MD;  Location: [facility];  Service: Orthopedics;  Laterality: Right;   NECK SURGERY     novemver 2019   STUMP REVISION Left 06/27/2021   Procedure: REVISION LEFT BELOW KNEE AMPUTATION;  Surgeon: [name], MD;  Location: [facility];  Service: Orthopedics;  Laterality: Left;   TRANSURETHRAL RESECTION OF PROSTATE N/A 06/28/2020   Procedure: TRANSURETHRAL RESECTION OF THE PROSTATE (TURP);  Surgeon: [name], MD;  Location: [facility];  Service: Urology;  Laterality: N/A;   WISDOM TOOTH EXTRACTION     Family History  Problem  Relation Age of Onset   Diabetes Mellitus II Mother    Kidney disease Mother    Diabetes Mellitus II Father    CAD Father    Cancer Father        prostate   Kidney disease Father    Diabetes Mellitus II Brother    Kidney disease Brother    Diabetes Mellitus II Brother    Stomach cancer Brother 61   Kidney disease Brother    Colon cancer Neg Hx    Colon polyps Neg Hx    Esophageal cancer Neg Hx    Rectal cancer Neg Hx    Pancreatic cancer Neg Hx   Patient denies a family hx of ESRD  Social History:  reports that he quit smoking about 33 years ago. His smoking use included cigars. He started smoking about 49 years ago. He has never used smokeless tobacco. He reports that he does not drink alcohol and does not use drugs. Allergies  Allergen Reactions  Morphine     Other reaction(s): Delusions (intolerance)   Mushroom Extract Complex Nausea Only   Prior to Admission medications   Medication Sig Start Date End Date Taking? Authorizing Provider  acetaminophen (TYLENOL) 500 MG tablet Take 1,000 mg by mouth every 8 (eight) hours as needed for mild pain or headache.   Yes [provider]  allopurinol (ZYLOPRIM) 100 MG tablet Take 2 tablets (200 mg total) by mouth daily. 05/22/21  Yes Angiulli, Lavon Paganini, PA-C  carvedilol (COREG) 12.5 MG tablet TAKE 1 TABLET BY MOUTH IN THE MORNING AND AT BEDTIME. 12/17/21  Yes Adrian Prows, MD  ibuprofen (ADVIL) 200 MG tablet Take 200 mg by mouth every 6 (six) hours as needed.   Yes [provider]  levothyroxine (SYNTHROID) 112 MCG tablet Take 1 tablet (112 mcg total) by mouth daily before breakfast. 05/22/21  Yes Angiulli, Lavon Paganini, PA-C  losartan (COZAAR) 50 MG tablet Take 50 mg by mouth 2 (two) times daily.   Yes [provider]  Methoxy PEG-Epoetin Beta (MIRCERA IJ) Inject into the skin. 09/14/21  Yes [provider]  modafinil (PROVIGIL) 200 MG tablet Take 1 tablet (200 mg total) by mouth daily. 03/11/22  Yes Raulkar,  Clide Deutscher, MD  OZEMPIC, 0.25 OR 0.5 MG/DOSE, 2 MG/3ML SOPN Inject 0.5 mg into the skin once a week. 09/19/21  Yes [provider]  pantoprazole (PROTONIX) 40 MG tablet TAKE 1 TABLET BY MOUTH EVERY DAY 06/04/22  Yes Irene Shipper, MD  pregabalin (LYRICA) 150 MG capsule Take 1 capsule by mouth in the morning and 2 capsules at night. Patient taking differently: Take 150 mg by mouth 3 (three) times daily. 05/22/21  Yes Angiulli, Lavon Paganini, PA-C  prochlorperazine (COMPAZINE) 5 MG tablet TAKE 1 TABLET BY MOUTH TWICE A DAY 09/06/21  Yes Irene Shipper, MD  rosuvastatin (CRESTOR) 10 MG tablet Take 1 tablet (10 mg total) by mouth daily. 05/22/21  Yes Angiulli, Lavon Paganini, PA-C  sevelamer carbonate (RENVELA) 800 MG tablet Take 1 tablet (800 mg total) by mouth 3 (three) times daily with meals. Patient taking differently: Take 800 mg by mouth daily. 05/22/21  Yes Angiulli, Lavon Paganini, PA-C  Vitamin D, Ergocalciferol, (DRISDOL) 1.25 MG (50000 UNIT) CAPS capsule Take 1 capsule (50,000 Units total) by mouth every 7 (seven) days. 03/11/22  Yes Raulkar, Clide Deutscher, MD  B Complex-C-Zn-Folic Acid (DIALYVITE Q000111Q WITH ZINC) 0.8 MG TABS Take 1 tablet by mouth daily. Patient not taking: Reported on 05/15/2022 03/13/22   Adrian Prows, MD  Darbepoetin Alfa (ARANESP) 100 MCG/0.5ML SOSY injection Inject 0.5 mLs (100 mcg total) into the vein every Thursday with hemodialysis. Patient not taking: Reported on 06/20/2022 05/24/21   Angiulli, Lavon Paganini, PA-C  Insulin Pen Needle 32G X 4 MM MISC Use to inject insulin up to 4 times daily as needed. 05/22/21   Raulkar, Clide Deutscher, MD  methocarbamol (ROBAXIN) 500 MG tablet Take 1 tablet (500 mg total) by mouth 3 (three) times daily. Patient not taking: Reported on 05/15/2022 07/23/21   [name], MD   Current Facility-Administered Medications  Medication Dose Route Frequency Provider Last Rate Last Admin   0.9 %  sodium chloride infusion (Manually program via Guardrails IV Fluids)   Intravenous  Once Marcelyn Bruins, MD       acetaminophen (TYLENOL) tablet 650 mg  650 mg Oral Q6H PRN Marcelyn Bruins, MD       Or   acetaminophen (TYLENOL) suppository 650 mg  650 mg Rectal Q6H PRN Marcelyn Bruins, MD       allopurinol (ZYLOPRIM) tablet 200 mg  200 mg Oral Daily Marcelyn Bruins, MD   200 mg at 06/21/22 1032   calcitRIOL (ROCALTROL) capsule 0.5 mcg  0.5 mcg Oral Daily Valentina Gu, NP   0.5 mcg at 06/21/22 1242   carvedilol (COREG) tablet 12.5 mg  12.5 mg Oral BID WC Marcelyn Bruins, MD   12.5 mg at 06/21/22 0809   feeding supplement (NEPRO CARB STEADY) liquid 237 mL  237 mL Oral PRN Valentina Gu, NP       ferric gluconate (FERRLECIT) 125 mg in sodium chloride 0.9 % 100 mL IVPB  125 mg Intravenous Q M,W,F-HD Valentina Gu, NP       insulin aspart (novoLOG) injection 0-6 Units  0-6 Units Subcutaneous TID WC Marcelyn Bruins, MD   1 Units at 06/21/22 1243   levothyroxine (SYNTHROID) tablet 112 mcg  112 mcg Oral QAC breakfast Marcelyn Bruins, MD   112 mcg at 06/21/22 0524   lidocaine (PF) (XYLOCAINE) 1 % injection 5 mL  5 mL Intradermal PRN Valentina Gu, NP       lidocaine-prilocaine (EMLA) cream 1 Application  1 Application Topical PRN Valentina Gu, NP       pantoprazole (PROTONIX) EC tablet 40 mg  40 mg Oral Daily Marcelyn Bruins, MD   40 mg at 06/21/22 1032   pentafluoroprop-tetrafluoroeth (GEBAUERS) aerosol 1 Application  1 Application Topical PRN Valentina Gu, NP       polyethylene glycol (MIRALAX / GLYCOLAX) packet 17 g  17 g Oral Daily PRN Marcelyn Bruins, MD       pregabalin (LYRICA) capsule 150 mg  150 mg Oral TID Marcelyn Bruins, MD   150 mg at 06/21/22 1032   rosuvastatin (CRESTOR) tablet 10 mg  10 mg Oral Daily Marcelyn Bruins, MD   10 mg at 06/21/22 1032   sevelamer carbonate (RENVELA) tablet 800 mg  800 mg Oral Daily Marcelyn Bruins, MD   800 mg at 06/21/22 0809   sodium chloride flush  (NS) 0.9 % injection 3 mL  3 mL Intravenous Q12H Marcelyn Bruins, MD   3 mL at 06/21/22 1032   Labs: Basic Metabolic Panel: Recent Labs  Lab 06/20/22 1140 06/21/22 0107  NA 132* 133*  K 4.5 4.4  CL 89* 92*  CO2 29 27  GLUCOSE 142* 95  BUN 58* 65*  CREATININE 6.49* 7.81*  CALCIUM 8.9 8.0*   Liver Function Tests: Recent Labs  Lab 06/20/22 1140 06/21/22 0107  AST 200* 172*  ALT 168* 178*  ALKPHOS 86 94  BILITOT 0.8 0.9  PROT 6.1* 5.4*  ALBUMIN 4.1 3.1*   Recent Labs  Lab 06/20/22 1140  LIPASE 147*   No results for input(s): "AMMONIA" in the last 168 hours. CBC: Recent Labs  Lab 06/20/22 1140 06/20/22 1749 06/21/22 0107  WBC 9.8  --  6.7  NEUTROABS 8.1*  --   --   HGB 4.9* 5.0* 5.5*  HCT 15.0* 16.3* 16.6*  MCV 95.5  --  96.0  PLT 116*  --  97*   Cardiac Enzymes: No results for input(s): "CKTOTAL", "CKMB", "CKMBINDEX", "TROPONINI" in the last 168 hours. CBG: Recent Labs  Lab 06/20/22 1139 06/20/22 2102 06/21/22 0606 06/21/22 1147  GLUCAP 136* 177* 169* 164*   Iron Studies: No results for input(s): "IRON", "TIBC", "TRANSFERRIN", "FERRITIN" in the  last 72 hours. Studies/Results: CT ABDOMEN PELVIS WO CONTRAST  Result Date: 06/20/2022 CLINICAL DATA:  Intermittent abdominal pain for 4 days. EXAM: CT ABDOMEN AND PELVIS WITHOUT CONTRAST TECHNIQUE: Multidetector CT imaging of the abdomen and pelvis was performed following the standard protocol without IV contrast. RADIATION DOSE REDUCTION: This exam was performed according to the departmental dose-optimization program which includes automated exposure control, adjustment of the mA and/or kV according to patient size and/or use of iterative reconstruction technique. COMPARISON:  05/05/2020 FINDINGS: Lower chest: Small left pleural effusion noted. Minimal overlying atelectasis. The heart is normal in size. No pericardial effusion. Aortic and coronary artery calcifications are noted. Hepatobiliary: Slightly  irregular liver contour and mildly enlarged caudate lobe may suggest changes of cirrhosis. No hepatic lesions are identified without contrast. No intrahepatic biliary dilatation. The gallbladder is surgically absent. No common bile duct dilatation. Pancreas: No mass, inflammation or ductal dilatation. Spleen: Normal size.  No focal lesions. Adrenals/Urinary Tract: Stable 3.8 x 3.3 cm right adrenal gland lesion containing macroscopic fat and consistent with a benign adrenal gland myelolipoma. The left adrenal gland is normal. Atrophic kidneys and patient on renal dialysis. Stable simple right renal cyst. This is not knee and further imaging evaluation or follow-up. The bladder is unremarkable. Stomach/Bowel: The stomach, duodenum, small and colon are grossly normal. No acute inflammatory process, mass lesions or obstructive findings. The terminal ileum and appendix are normal. The sigmoid colon diverticulosis but no findings for acute diverticulitis. Vascular/Lymphatic: Advanced atherosclerotic calcification involving the aorta, iliac arteries and branch vessels but no aneurysm. Stable scattered mesenteric and retroperitoneal lymph nodes but no mass or adenopathy. Reproductive: The prostate gland contains radiopaque markers possibly related to prostate cancer or UroLift procedure. The seminal vesicles are. Other: Small amount of free scattered abdominal fluid and moderate free pelvic fluid which appears simple. This could be related to the patient's kidney disease or liver disease. Musculoskeletal: No significant bony findings. IMPRESSION: 1. No acute abdominal/pelvic findings, mass lesions or adenopathy. 2. Small amount of free scattered abdominal fluid and moderate free pelvic fluid which appears simple. This could be related to the patient's kidney disease or liver disease. 3. Stable right adrenal gland myelolipoma. 4. Atrophic kidneys in patient on renal dialysis. 5. Small left pleural effusion with overlying  atelectasis. 6. Slightly irregular liver contour and mildly enlarged caudate lobe may suggest changes of cirrhosis. 7. Status post cholecystectomy. No biliary dilatation. Aortic Atherosclerosis (ICD10-I70.0). Electronically Signed   By: Marijo Sanes M.D.   On: 06/20/2022 12:48   DG Chest Portable 1 View  Result Date: 06/20/2022 CLINICAL DATA:  72 year old male presents with cough. EXAM: PORTABLE CHEST 1 VIEW COMPARISON:  Aug 18, 2021 FINDINGS: Post median sternotomy for CABG with stable cardiomediastinal contours. Hilar structures are normal. Lungs are clear.  No visible pneumothorax. On limited assessment no acute skeletal process. IMPRESSION: Post median sternotomy for CABG without acute cardiopulmonary disease. Electronically Signed   By: Zetta Bills M.D.   On: 06/20/2022 12:39    ROS: As per HPI otherwise negative.   Physical Exam: Vitals:   06/21/22 0628 06/21/22 0733 06/21/22 0930 06/21/22 1109  BP: (!) 110/56 (!) 117/54 (!) 110/56 128/69  Pulse: 82 81 77 78  Resp: 20 18 15 18   Temp: 98.1 F (36.7 C) 98.5 F (36.9 C) 98.4 F (36.9 C) 98.6 F (37 C)  TempSrc: Oral Oral Oral Oral  SpO2: 96% 97%  98%  Weight:      Height:  General: Chronically ill appearing elderly male in no acute distress. Head: Normocephalic, atraumatic, sclera non-icteric, mucus membranes are moist Neck: Supple. JVD not elevated. Lungs: Clear bilaterally to auscultation without wheezes, rales, or rhonchi. Breathing is unlabored. Heart: RRR with S1 S2. No murmurs, rubs, or gallops appreciated. Abdomen: Soft, NT, NABS M-S:  Strength and tone appear normal for age. Lower extremities:L BKA wearing prosthesis. Wound bottom of  R foot not visualized-dressing intact.  Neuro: Alert and oriented X 3. Moves all extremities spontaneously. Psych:  Responds to questions appropriately with a normal affect. Dialysis Access: L AVF + T/B  Dialysis Orders: [facility]: W,F Sunday  3 hrs 48 min Nx  Stage 2.0 K 45 lactate volume per tx 60 liters Max FF: 73% BFR 425 DFR 15.8 L/Hr. Max UFR0.89 ml/kg/hr EDW 88 kg    Assessment/Plan:  GIB/ABLA-W/U per primary/GI. HGB 4.9 on admission, now 5.5 S/P 3 units PRBCs. Transfuse 2 more units on HD today. Upper Endo tomorrow per GI.   ESRD -  Home HD 3 X weekly. Will have HD MWF while here. K+ 4.0 use 2.0 K bath. No heparin.   Hypertension/volume  - BP soft. On losartan mg PO BID, Carvedilol 25 mg PO daily at home. Losartan currently on hold. Carvedilol dose has been changed to 12.5 mg PO BID. No evidence of volume overload by exam. UF as tolerated. He is about 2 kgs above OP EDW.   Anemia  - As noted in #1. Follow HGB, transfuse as necessary. OP Iron profile 06/10/22 Fe 58 Tsat 22%. Continue Fe load.   Metabolic bone disease -  Continue senvelamer binders, VDRA. Add PO4 to labs.   Nutrition - Changed to renal/Carb mod diet. Albumin low. Add protein supplements.  DMT2- management per primary H/O Atrial Flutter-not on [facility] Renal Recommendations: Do not use morphine in ESRD pt. Avoid baclofen. Renal dose all antibiotics and antivirals to appropriate creatinine clearance.   Rita H. [name] Shark, NP-C 06/21/2022, 1:29 PM  [employer] [phone]     Seen and examined independently.  Agree with note and exam as documented above by physician extender and as noted here.  72 year old male with a history of ESRD MWF (home hemo patient), HTN, CAD s/p CABG, a flutter, PAD and gout who presented to the hospital with fatigue and melena.  Patient states that he had been having dark stools for about a week and states hasn't happened before.  Got PRBC's.  Hb this AM 5.5.  he is to get an additional unit on HD today.  His energy level is a little better.  Patient reports plans for EGD tomorrow  General adult male in bed in no acute distress HEENT normocephalic atraumatic extraocular movements intact sclera anicteric Neck supple trachea midline Lungs  clear to auscultation bilaterally normal work of breathing at rest  Heart S1S2 no rub Abdomen soft nontender nondistended Extremities right foot is wrapped; no edema; right leg and residual left limb in brace.  Left BKA Psych normal mood and affect Neuro - alert; conversant and follows commands Access:  LUE AVF in use   Acute GI bleed  - For EGD tomorrow per pt report  Acute blood loss anemia as well as chronic disease - s/p 3 units PRBC's   ESRD  - HD per MWF schedue   HTN - controlled on current regimen   Metabolic bone disease - on sevelamer. Check phos   History of atrial flutter - not on  anticoagulation   Transaminitis - per primary team   Claudia Desanctis, MD 06/21/2022 3:21 PM

## 2022-06-21 NOTE — Consult Note (Signed)
WOC Nurse Consult Note: Reason for Consult: right foot wound Patient history of HTN, CAD, DM, ESRD, LLE amputation (3/23). Followed outpatient by Dr. [name] currently s/p right foot abscess I&D 05/17/22.  Recently seen in Dr. Jess Barters office for placement of Keresis graft on 06/17/22 Wound type: Neuropathic Pressure Injury POA: NA Measurement: in office 06/17/22 4cm x 2cm x 2cm; grafted with Kerecis MariGen Micro graft  Wound bed: graft in place; reviewed images Drainage (amount, consistency, odor) none Periwound: intact Dressing procedure/placement/frequency: Cover right foot wound with Adaptic dressing ([place] # [id number] or [id number]) top with ABD pad, secure with kerlix and ACE wrap.  Limit WB  Notified Dr. [name] of patient admission to clarify wound care and frequency of dressings.  FU with Dr. [name] as scheduled    Re consult if needed, will not follow at this time. Thanks  Steph Cheadle [employer], RN,CWOCN, CNS, [place] [phone])

## 2022-06-21 NOTE — Progress Notes (Signed)
Patient refusing Telemetry, Dr. Sidney Ace aware.

## 2022-06-21 NOTE — Progress Notes (Signed)
Initial Nutrition Assessment  DOCUMENTATION CODES:   Not applicable  INTERVENTION:  Nepro Shake po BID, each supplement provides 425 kcal and 19 grams protein Renal MVI  Education on adequate nutrition    NUTRITION DIAGNOSIS:   Inadequate oral intake related to poor appetite as evidenced by per patient/family report, meal completion < 50%.     GOAL:   Patient will meet greater than or equal to 90% of their needs     MONITOR:   PO intake, Weight trends, Supplement acceptance  REASON FOR ASSESSMENT:   Malnutrition Screening Tool    ASSESSMENT:  72 y.o. male with PMHx of atrial flutter s/p ablation, thrombocytopenia, anemia, s/p LBKA, s/p R great amputation, CAD s/p CABG, prostate cancer, DM2, hypothyroidism, HTN, HLD, gout, ESRD on HD, GERD, PAD present with abdominal pain, nausea and melena and admitted for symptomatic anemia.    1 week of worsening fatigue, intermittent abdominal, and intermittent melanotic stool with occasional streaks of blood   Patient reports fair appetite while lying in bed at HD. Eh states he has not been hardly eating for several days now and would eat bites of a meal or half a sandwich, and drink one nepro per day. He denies N/V/D/C. Patient states he has been passing some dark stool. He reports dry UBW as 200# which is consistent with CBW.   Patient reports sometimes he has trouble swallowing but no current issues. HE would like to continue Nepro while inpatient and is hopeful to go home soon.   Labs: Na 133, BUN 65, Cr 7.8 Meds: ferrlecit, novolog, protonix, renvela, NS  Wt: 6.1 kg wt gain x 3 months;  90.7 kg dialysis weight  Po: no meals documented  I/O's: + 1L     NUTRITION - FOCUSED PHYSICAL EXAM:  Flowsheet Row Most Recent Value  Orbital Region No depletion  Upper Arm Region No depletion  Thoracic and Lumbar Region No depletion  Buccal Region Mild depletion  Temple Region No depletion  Clavicle Bone Region Mild depletion   Clavicle and Acromion Bone Region Mild depletion  Scapular Bone Region No depletion  Dorsal Hand Moderate depletion  Patellar Region No depletion  [L BKA, leg brace on R leg]  Anterior Thigh Region No depletion  Posterior Calf Region Unable to assess  [L BKA, brace on R leg]  Edema (RD Assessment) None  Hair Reviewed  Eyes Reviewed  Mouth Reviewed  Skin Reviewed  Nails Reviewed       Diet Order:   Diet Order             Diet renal/carb modified with fluid restriction Diet-HS Snack? Nothing; Fluid restriction: 1200 mL Fluid; Room service appropriate? Yes; Fluid consistency: Thin  Diet effective now                   EDUCATION NEEDS:   Education needs have been addressed  Skin:  Skin Assessment: Skin Integrity Issues: Skin Integrity Issues:: Other (Comment) Other: recent skin graft  Last BM:  PTA  Height:   Ht Readings from Last 1 Encounters:  06/20/22 5\' 10"  (1.778 m)    Weight:   Wt Readings from Last 1 Encounters:  06/21/22 93.6 kg    Ideal Body Weight:     BMI:  Body mass index is 29.61 kg/m.  Estimated Nutritional Needs:   Kcal:  1900-2340 kcal/day  Protein:  75-115 g protein/day  Fluid:  1200 ml    Trey Paula, RDN, LDN  Clinical Nutrition

## 2022-06-21 NOTE — Plan of Care (Signed)

## 2022-06-21 NOTE — Progress Notes (Signed)
2 unit of RBC of the night started at 0315. Vital signs taken after starting blood transfusion WNL. Patient denies any complaints at the moment. Will continue to monitor.

## 2022-06-21 NOTE — TOC Initial Note (Signed)
Transition of Care [facility]) - Initial/Assessment Note    Patient Details  Name: [name] MRN: [id number] Date of Birth: [birth year]  Transition of Care [facility]) CM/SW Contact:    Pollie Friar, RN Phone Number: 06/21/2022, 11:33 AM  Clinical Narrative:                 Pt is from home with spouse. Pt states she is with him most of the time.  Spouse provides needed transportation. Pt manages his own medications and denies any issues. [facility] [address] [facility]. May need therapy to eval prior to being medically ready for d/c. Pt states he continues to feel weak. TOC following.   Expected Discharge Plan: [place] Barriers to Discharge: Continued Medical Work up   Patient Goals and CMS Choice            Expected Discharge Plan and Services   Discharge Planning Services: CM Consult   Living arrangements for the past 2 months: [place]                                      Prior Living Arrangements/Services Living arrangements for the past 2 months: Single Family Home Lives with:: Spouse Patient language and need for interpreter reviewed:: Yes Do you feel safe going back to the place where you live?: Yes        Care giver support system in place?: Yes (comment) Current home services: DME (shower seat/ prosthesis/  wheelchair/ walker) Criminal Activity/Legal Involvement Pertinent to Current Situation/Hospitalization: No - Comment as needed  Activities of Daily Living Home Assistive Devices/Equipment: Brace (specify type), Walker (specify type), Wheelchair ADL Screening (condition at time of admission) Patient's cognitive ability adequate to safely complete daily activities?: Yes Is the patient deaf or have difficulty hearing?: No Does the patient have difficulty seeing, even when wearing glasses/contacts?: Yes Does the patient have difficulty concentrating, remembering, or making decisions?: No Patient able to  express need for assistance with ADLs?: Yes Does the patient have difficulty dressing or bathing?: Yes Independently performs ADLs?: Yes (appropriate for developmental age) Does the patient have difficulty walking or climbing stairs?: Yes Weakness of Legs: Both Weakness of Arms/Hands: Both  Permission Sought/Granted                  Emotional Assessment Appearance:: Appears stated age Attitude/Demeanor/Rapport: Engaged Affect (typically observed): Accepting Orientation: : Oriented to Self, Oriented to Place, Oriented to Situation, Oriented to  Time   Psych Involvement: No (comment)  Admission diagnosis:  Symptomatic anemia [D64.9] Patient Active Problem List   Diagnosis Date Noted   Abscess of right foot 05/17/2022   Contraindication to anticoagulation therapy GU Bleed 03/13/2022   Paroxysmal atrial fibrillation ([place]) 08/18/2021   Anemia of chronic renal failure 08/18/2021   Thrombocytopenia ([place]) 123456   Acute metabolic encephalopathy 123456   Action induced myoclonus 08/18/2021   Left below-knee amputee ([place]) 05/11/2021   Symptomatic anemia 09/13/2020   COVID-19 virus infection 09/13/2020   Pressure injury of skin 06/29/2020   Gross hematuria 06/16/2020   Typical atrial flutter ([place]) 03/24/2020   Bilateral pleural effusion 02/24/2020   Healthcare maintenance 01/28/2020   Shortness of breath 01/28/2020   History of partial ray amputation of fourth toe of right foot ([place]) 09/08/2019   Chronic cough 06/25/2019   Cutaneous abscess of right foot  AKI (acute kidney injury) ([place]) 12/14/2018   ESRD on hemodialysis ([place]) 12/14/2018   S/P CABG (coronary artery bypass graft) 12/11/2018   Gait abnormality 08/24/2018   Diabetic neuropathy ([place]) 02/06/2018   Cervical myelopathy ([place]) 02/06/2018   Onychomycosis 10/30/2017   Other spondylosis with radiculopathy, cervical region 01/27/2017   Midfoot ulcer, right, limited to breakdown of skin ([place]) 11/15/2016    Lateral epicondylitis, left elbow 08/12/2016   Prostate cancer ([place]) 06/19/2016   Gout 09/14/2012   Hypertension associated with diabetes ([place]) 09/14/2012   Hypothyroidism 09/14/2012   CAD (coronary artery disease) 09/14/2012   Insulin dependent type 2 diabetes mellitus ([place]) 09/14/2012   Hyperlipidemia associated with type 2 diabetes mellitus ([place])    Neuropathy ([place])    PCP:  Lorenda Hatchet, FNP Pharmacy[id number] [place], [address] [place] AT [address] [place] [address] Phone: [phone] Fax: [place] [address] [place] [address] Phone: [phone] Fax: [phone]     Social Determinants of Health (SDOH) Social History: SDOH Screenings   Food Insecurity: No Food Insecurity (06/20/2022)  Housing: Low Risk  (06/20/2022)  Transportation Needs: No Transportation Needs (06/20/2022)  Utilities: Not At Risk (06/20/2022)  Depression (PHQ2-9): Low Risk  (03/11/2022)  Tobacco Use: Medium Risk (06/20/2022)   SDOH Interventions:     Readmission Risk Interventions     No data to display

## 2022-06-21 NOTE — Consult Note (Signed)
Referring Provider: Dr. Trilby Drummer Primary Care Physician:  Lorenda Hatchet, [place] Primary Gastroenterologist:  Dr. Henrene Pastor  Reason for Consultation:  Anemia and heme negative  HPI: [name] is a 72 y.o. male with medical history significant of atrial flutter status post ablation not on blood thinner, thrombocytopenia, anemia, status post left BKA, status post right great toe amputation, CAD status post CABG, prostate cancer, diabetes, hypothyroidism, hypertension, hyperlipidemia, gout, ESRD on HD M/W/F, GERD, PAD presenting with abdominal pain, nausea, melena, and weakness.  Hemoglobin found to be 4.9 g down from 8.5 g 1 month ago.  Received 1 unit of packed red blood cells and increased to 5.5 g.  Received 2 more now as well.  He reports having dark stools at home for the past few weeks or so.  Also indicates that he has been using some NSAIDs for pain related to his recent skin graft.  Occasional sees bright red blood on the toilet paper when he wipes.  Occasional nausea.    EGD 05/2020 was normal.    He said any colonoscopy would have been years ago.  Past Medical History:  Diagnosis Date   Acute osteomyelitis of metatarsal bone of left foot ([place])    Ambulates with cane    straight cane   Anemia    Cervical myelopathy (HCC) 02/06/2018   Chronic kidney disease    dailysis M W F- home   Community acquired pneumonia of left lower lobe of lung 123456   Complication of anesthesia    Coronary artery disease    Dehiscence of amputation stump of left lower extremity (HCC)    Diabetes mellitus without complication ([place])    type2   Diabetic foot ulcer ([place])    Diabetic neuropathy ([place]) 02/06/2018   Dysrhythmia    Gait abnormality 08/24/2018   GERD (gastroesophageal reflux disease)     01/06/20- not current   Gout    History of blood transfusion    History of blood transfusion    History of kidney stones    passed stones   Hypercholesteremia    Hypertension     Hypothyroidism    Neuromuscular disorder (HCC)    neuropathy left leg and bilateral feet   Neuropathy    Partial nontraumatic amputation of foot, left ([place]) 02/20/2021   PONV (postoperative nausea and vomiting)    Prostate cancer ([place])    PVD (peripheral vascular disease) ([place])    with amputations    Past Surgical History:  Procedure Laterality Date   A-FLUTTER ABLATION N/A 04/06/2020   Procedure: A-FLUTTER ABLATION;  Surgeon: [name], MD;  Location: [facility];  Service: Cardiovascular;  Laterality: N/A;   AMPUTATION Left 12/25/2013   Procedure: AMPUTATION RAY LEFT 5TH RAY;  Surgeon: [name], MD;  [facility];  Service: Orthopedics;  Laterality: Left;   AMPUTATION Right 12/15/2018   Procedure: AMPUTATION OF 4TH AND 5TH TOES RIGHT FOOT;  Surgeon: [name], MD;  Location: [facility];  Service: Orthopedics;  Laterality: Right;   AMPUTATION Left 02/02/2021   Procedure: LEFT FOOT 4TH RAY AMPUTATION;  Surgeon: [name], MD;  Location: [facility];  Service: Orthopedics;  Laterality: Left;   AMPUTATION Left 05/09/2021   Procedure: LEFT BELOW KNEE AMPUTATION;  Surgeon: [name], MD;  Location: [facility];  Service: Orthopedics;  Laterality: Left;   APPLICATION OF WOUND VAC Right 12/15/2018   Procedure: APPLICATION OF WOUND VAC;  Surgeon: [name], MD;  Location: [facility];  Service: Orthopedics;  Laterality: Right;   AV FISTULA PLACEMENT Left 03/25/2019   Procedure: LEFT ARM ARTERIOVENOUS (AV) FISTULA CREATION;  Surgeon: [name], MD;  Location: [facility];  Service: Vascular;  Laterality: Left;   [place] Left 05/20/2019   Procedure: SECOND STAGE LEFT BASCILIC VEIN TRANSPOSITION;  Surgeon: [name], MD;  Location: [facility];  Service: Vascular;  Laterality: Left;   CARDIAC CATHETERIZATION  02/17/2014   CHOLECYSTECTOMY     COLONOSCOPY  2011   in [place], [place]  2008   CYSTOSCOPY N/A  06/29/2020   Procedure: [place];  Surgeon: [name], MD;  Location: [facility];  Service: Urology;  Laterality: N/A;   CYSTOSCOPY WITH FULGERATION Bilateral 06/17/2020   Procedure: CYSTOSCOPY,BILATERAL RETROGRADE, CLOT EVACUATION WITH FULGERATION OF THE BLADDER;  Surgeon: Janith Lima, MD;  Location: [facility];  Service: Urology;  Laterality: Bilateral;   CYSTOSCOPY WITH FULGERATION N/A 06/28/2020   Procedure: CYSTOSCOPY WITH CLOT EVACUATION AND FULGERATION OF BLEEDERS;  Surgeon: [name], MD;  Location: [facility];  Service: Urology;  Laterality: N/A;  1 HR   I & D EXTREMITY Right 12/15/2018   Procedure: DEBRIDEMENT RIGHT FOOT;  Surgeon: [name], MD;  Location: [facility];  Service: Orthopedics;  Laterality: Right;   I & D EXTREMITY Right 08/27/2019   Procedure: PARTIAL CUBOID EXCISION RIGHT FOOT;  Surgeon: [name], MD;  Location: [facility];  Service: Orthopedics;  Laterality: Right;   I & D EXTREMITY Right 01/07/2020   Procedure: RIGHT FOOT EXCISION INFECTED BONE;  Surgeon: [name], MD;  Location: [facility];  Service: Orthopedics;  Laterality: Right;   I & D EXTREMITY Right 05/17/2022   Procedure: IRRIGATION AND DEBRIDEMENT RIGHT FOOT ABSCESS;  Surgeon: [name], MD;  Location: [facility];  Service: Orthopedics;  Laterality: Right;   NECK SURGERY     novemver 2019   STUMP REVISION Left 06/27/2021   Procedure: REVISION LEFT BELOW KNEE AMPUTATION;  Surgeon: [name], MD;  Location: [facility];  Service: Orthopedics;  Laterality: Left;   TRANSURETHRAL RESECTION OF PROSTATE N/A 06/28/2020   Procedure: TRANSURETHRAL RESECTION OF THE PROSTATE (TURP);  Surgeon: [name], MD;  Location: [facility];  Service: Urology;  Laterality: N/A;   WISDOM TOOTH EXTRACTION      Prior to Admission medications   Medication Sig Start Date End Date Taking? Authorizing Provider  acetaminophen (TYLENOL) 500 MG tablet Take 1,000 mg by mouth every 8 (eight) hours as  needed for mild pain or headache.   Yes [provider]  allopurinol (ZYLOPRIM) 100 MG tablet Take 2 tablets (200 mg total) by mouth daily. 05/22/21  Yes Angiulli, Lavon Paganini, PA-C  carvedilol (COREG) 12.5 MG tablet TAKE 1 TABLET BY MOUTH IN THE MORNING AND AT BEDTIME. 12/17/21  Yes Adrian Prows, MD  ibuprofen (ADVIL) 200 MG tablet Take 200 mg by mouth every 6 (six) hours as needed.   Yes [provider]  levothyroxine (SYNTHROID) 112 MCG tablet Take 1 tablet (112 mcg total) by mouth daily before breakfast. 05/22/21  Yes Angiulli, Lavon Paganini, PA-C  losartan (COZAAR) 50 MG tablet Take 50 mg by mouth 2 (two) times daily.   Yes [provider]  Methoxy PEG-Epoetin Beta (MIRCERA IJ) Inject into the skin. 09/14/21  Yes [provider]  modafinil (PROVIGIL) 200 MG tablet Take  1 tablet (200 mg total) by mouth daily. 03/11/22  Yes Raulkar, Clide Deutscher, MD  OZEMPIC, 0.25 OR 0.5 MG/DOSE, 2 MG/3ML SOPN Inject 0.5 mg into the skin once a week. 09/19/21  Yes [provider]  pantoprazole (PROTONIX) 40 MG tablet TAKE 1 TABLET BY MOUTH EVERY DAY 06/04/22  Yes Irene Shipper, MD  pregabalin (LYRICA) 150 MG capsule Take 1 capsule by mouth in the morning and 2 capsules at night. Patient taking differently: Take 150 mg by mouth 3 (three) times daily. 05/22/21  Yes Angiulli, Lavon Paganini, PA-C  prochlorperazine (COMPAZINE) 5 MG tablet TAKE 1 TABLET BY MOUTH TWICE A DAY 09/06/21  Yes Irene Shipper, MD  rosuvastatin (CRESTOR) 10 MG tablet Take 1 tablet (10 mg total) by mouth daily. 05/22/21  Yes Angiulli, Lavon Paganini, PA-C  sevelamer carbonate (RENVELA) 800 MG tablet Take 1 tablet (800 mg total) by mouth 3 (three) times daily with meals. Patient taking differently: Take 800 mg by mouth daily. 05/22/21  Yes Angiulli, Lavon Paganini, PA-C  Vitamin D, Ergocalciferol, (DRISDOL) 1.25 MG (50000 UNIT) CAPS capsule Take 1 capsule (50,000 Units total) by mouth every 7 (seven) days. 03/11/22  Yes Raulkar, Clide Deutscher, MD  B  Complex-C-Zn-Folic Acid (DIALYVITE Q000111Q WITH ZINC) 0.8 MG TABS Take 1 tablet by mouth daily. Patient not taking: Reported on 05/15/2022 03/13/22   Adrian Prows, MD  Darbepoetin Alfa (ARANESP) 100 MCG/0.5ML SOSY injection Inject 0.5 mLs (100 mcg total) into the vein every Thursday with hemodialysis. Patient not taking: Reported on 06/20/2022 05/24/21   Angiulli, Lavon Paganini, PA-C  Insulin Pen Needle 32G X 4 MM MISC Use to inject insulin up to 4 times daily as needed. 05/22/21   Raulkar, Clide Deutscher, MD  methocarbamol (ROBAXIN) 500 MG tablet Take 1 tablet (500 mg total) by mouth 3 (three) times daily. Patient not taking: Reported on 05/15/2022 07/23/21   [name], MD    Current Facility-Administered Medications  Medication Dose Route Frequency Provider Last Rate Last Admin   0.9 %  sodium chloride infusion (Manually program via Guardrails IV Fluids)   Intravenous Once Marcelyn Bruins, MD       acetaminophen (TYLENOL) tablet 650 mg  650 mg Oral Q6H PRN Marcelyn Bruins, MD       Or   acetaminophen (TYLENOL) suppository 650 mg  650 mg Rectal Q6H PRN Marcelyn Bruins, MD       allopurinol (ZYLOPRIM) tablet 200 mg  200 mg Oral Daily Marcelyn Bruins, MD       carvedilol (COREG) tablet 12.5 mg  12.5 mg Oral BID WC Marcelyn Bruins, MD   12.5 mg at 06/21/22 0809   insulin aspart (novoLOG) injection 0-6 Units  0-6 Units Subcutaneous TID WC Marcelyn Bruins, MD   1 Units at 06/21/22 0809   levothyroxine (SYNTHROID) tablet 112 mcg  112 mcg Oral QAC breakfast Marcelyn Bruins, MD   112 mcg at 06/21/22 0524   pantoprazole (PROTONIX) EC tablet 40 mg  40 mg Oral Daily Marcelyn Bruins, MD       polyethylene glycol (MIRALAX / GLYCOLAX) packet 17 g  17 g Oral Daily PRN Marcelyn Bruins, MD       pregabalin (LYRICA) capsule 150 mg  150 mg Oral TID Marcelyn Bruins, MD   150 mg at 06/20/22 2126   rosuvastatin (CRESTOR) tablet 10 mg  10 mg Oral Daily Marcelyn Bruins, MD       sevelamer  carbonate (RENVELA) tablet 800 mg  800 mg Oral Daily Marcelyn Bruins, MD   800 mg at 06/21/22 0809   sodium chloride flush (NS) 0.9 % injection 3 mL  3 mL Intravenous Q12H Marcelyn Bruins, MD   3 mL at 06/20/22 2040    Allergies as of 06/20/2022 - Review Complete 06/20/2022  Allergen Reaction Noted   Morphine  09/20/2021   Mushroom extract complex Nausea Only 09/14/2012    Family History  Problem Relation Age of Onset   Diabetes Mellitus II Mother    Kidney disease Mother    Diabetes Mellitus II Father    CAD Father    Cancer Father        prostate   Kidney disease Father    Diabetes Mellitus II Brother    Kidney disease Brother    Diabetes Mellitus II Brother    Stomach cancer Brother 7   Kidney disease Brother    Colon cancer Neg Hx    Colon polyps Neg Hx    Esophageal cancer Neg Hx    Rectal cancer Neg Hx    Pancreatic cancer Neg Hx     Social History   Socioeconomic History   Marital status: Married    Spouse name: Not on file   Number of children: 2   Years of education: Not on file   Highest education level: Not on file  Occupational History   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  Other Topics Concern   Not on file  Social History Narrative   Regular exercise: yes 3 times a week   Caffeine use: hot tea   Social Determinants of Health   Financial Resource Strain: Not on file  Food Insecurity: No Food Insecurity (06/20/2022)   Hunger Vital Sign    Worried About Running Out of Food in the Last Year: Never true    Ran Out of Food in the Last Year: Never true  Transportation Needs: No  Transportation Needs (06/20/2022)   PRAPARE - Hydrologist (Medical): No    Lack of Transportation (Non-Medical): No  Physical Activity: Not on file  Stress: Not on file  Social Connections: Not on file  Intimate Partner Violence: Not At Risk (06/20/2022)   Humiliation, Afraid, Rape, and Kick questionnaire    Fear of Current or Ex-Partner: No    Emotionally Abused: No    Physically Abused: No    Sexually Abused: No    Review of Systems: ROS is O/W negative except as mentioned in HPI.  Physical Exam: Vital signs in last 24 hours: Temp:  [97.9 F (36.6 C)-99.3 F (37.4 C)] 98.5 F (36.9 C) (03/15 0733) Pulse Rate:  [74-89] 81 (03/15 0733) Resp:  [16-33] 18 (03/15 0733) BP: (90-131)/(37-87) 117/54 (03/15 0733) SpO2:  [92 %-100 %] 97 % (03/15 0733) Weight:  [90.7 kg] 90.7 kg (03/15 0356) Last BM Date :  (PTA) General:  Alert, chronically ill-appearing, pleasant and cooperative in NAD Head:  Normocephalic and atraumatic. Eyes:  Sclera clear, no icterus.  Conjunctiva pink. Ears:  Normal auditory acuity. Mouth:  No deformity or lesions.   Lungs:  Clear throughout to auscultation.  No wheezes, crackles, or rhonchi.  Heart:  Regular rate and rhythm; no murmurs, clicks, rubs, or gallops. Abdomen:  Soft, non-distended.  BS present.  Non-tender.   Rectal:  Deferred.  Was heme negative here.  Msk:  Symmetrical without gross deformities. Pulses:  Normal pulses noted. Extremities:  Left BKA.  Right leg in brace. Neurologic:  Alert and oriented x 4;  grossly normal neurologically. Skin:  Intact without significant lesions or rashes. Psych:  Alert and cooperative. Normal mood and affect.  Intake/Output from previous day: 03/14 0701 - 03/15 0700 In: 1182.8 [I.V.:22.8; Blood:1160] Out: 300 [Urine:300]  Lab Results: Recent Labs    06/20/22 1140 06/20/22 1749 06/21/22 0107  WBC 9.8  --  6.7  HGB 4.9* 5.0* 5.5*  HCT 15.0* 16.3* 16.6*  PLT 116*  --  97*    BMET Recent Labs    06/20/22 1140 06/21/22 0107  NA 132* 133*  K 4.5 4.4  CL 89* 92*  CO2 29 27  GLUCOSE 142* 95  BUN 58* 65*  CREATININE 6.49* 7.81*  CALCIUM 8.9 8.0*   LFT Recent Labs    06/21/22 0107  PROT 5.4*  ALBUMIN 3.1*  AST 172*  ALT 178*  ALKPHOS 94  BILITOT 0.9   PT/INR Recent Labs    06/20/22 1140  LABPROT 17.6*  INR 1.5*   Studies/Results: CT ABDOMEN PELVIS WO CONTRAST  Result Date: 06/20/2022 CLINICAL DATA:  Intermittent abdominal pain for 4 days. EXAM: CT ABDOMEN AND PELVIS WITHOUT CONTRAST TECHNIQUE: Multidetector CT imaging of the abdomen and pelvis was performed following the standard protocol without IV contrast. RADIATION DOSE REDUCTION: This exam was performed according to the departmental dose-optimization program which includes automated exposure control, adjustment of the mA and/or kV according to patient size and/or use of iterative reconstruction technique. COMPARISON:  05/05/2020 FINDINGS: Lower chest: Small left pleural effusion noted. Minimal overlying atelectasis. The heart is normal in size. No pericardial effusion. Aortic and coronary artery calcifications are noted. Hepatobiliary: Slightly irregular liver contour and mildly enlarged caudate lobe may suggest changes of cirrhosis. No hepatic lesions are identified without contrast. No intrahepatic biliary dilatation. The gallbladder is surgically absent. No common bile duct dilatation. Pancreas: No mass, inflammation or ductal dilatation. Spleen: Normal size.  No focal lesions. Adrenals/Urinary Tract: Stable 3.8 x 3.3 cm right adrenal gland lesion containing macroscopic fat and consistent with a benign adrenal gland myelolipoma. The left adrenal gland is normal. Atrophic kidneys and patient on renal dialysis. Stable simple right renal cyst. This is not knee and further imaging evaluation or follow-up. The bladder is unremarkable. Stomach/Bowel: The stomach, duodenum, small and colon are grossly  normal. No acute inflammatory process, mass lesions or obstructive findings. The terminal ileum and appendix are normal. The sigmoid colon diverticulosis but no findings for acute diverticulitis. Vascular/Lymphatic: Advanced atherosclerotic calcification involving the aorta, iliac arteries and branch vessels but no aneurysm. Stable scattered mesenteric and retroperitoneal lymph nodes but no mass or adenopathy. Reproductive: The prostate gland contains radiopaque markers possibly related to prostate cancer or UroLift procedure. The seminal vesicles are. Other: Small amount of free scattered abdominal fluid and moderate free pelvic fluid which appears simple. This could be related to the patient's kidney disease or liver disease. Musculoskeletal: No significant bony findings. IMPRESSION: 1. No acute abdominal/pelvic findings, mass lesions or adenopathy. 2. Small amount  of free scattered abdominal fluid and moderate free pelvic fluid which appears simple. This could be related to the patient's kidney disease or liver disease. 3. Stable right adrenal gland myelolipoma. 4. Atrophic kidneys in patient on renal dialysis. 5. Small left pleural effusion with overlying atelectasis. 6. Slightly irregular liver contour and mildly enlarged caudate lobe may suggest changes of cirrhosis. 7. Status post cholecystectomy. No biliary dilatation. Aortic Atherosclerosis (ICD10-I70.0). Electronically Signed   By: Marijo Sanes M.D.   On: 06/20/2022 12:48   DG Chest Portable 1 View  Result Date: 06/20/2022 CLINICAL DATA:  72 year old male presents with cough. EXAM: PORTABLE CHEST 1 VIEW COMPARISON:  Aug 18, 2021 FINDINGS: Post median sternotomy for CABG with stable cardiomediastinal contours. Hilar structures are normal. Lungs are clear.  No visible pneumothorax. On limited assessment no acute skeletal process. IMPRESSION: Post median sternotomy for CABG without acute cardiopulmonary disease. Electronically Signed   By: Zetta Bills  M.D.   On: 06/20/2022 12:39    IMPRESSION:  *Anemia and heme negative stool: Hemoglobin 4.9 g, has received 3 units of blood cells.  Hemoccult negative here, but he does report dark stools at home and some NSAID use recently. *ESRD on HD M/W/F  PLAN: -Will plan for EGD on 3/16 to start.   -Monitor Hgb and transfuse further prn. -Pantoprazole 40 mg PO daily ok for now since no active bleeding.  Laban Emperor. Tiegan Terpstra  06/21/2022, 9:24 AM

## 2022-06-21 NOTE — H&P (View-Only) (Signed)
Referring Provider: Dr. Trilby Drummer Primary Care Physician:  Lorenda Hatchet, [place] Primary Gastroenterologist:  Dr. Henrene Pastor  Reason for Consultation:  Anemia and heme negative  HPI: [name] is a 72 y.o. male with medical history significant of atrial flutter status post ablation not on blood thinner, thrombocytopenia, anemia, status post left BKA, status post right great toe amputation, CAD status post CABG, prostate cancer, diabetes, hypothyroidism, hypertension, hyperlipidemia, gout, ESRD on HD M/W/F, GERD, PAD presenting with abdominal pain, nausea, melena, and weakness.  Hemoglobin found to be 4.9 g down from 8.5 g 1 month ago.  Received 1 unit of packed red blood cells and increased to 5.5 g.  Received 2 more now as well.  He reports having dark stools at home for the past few weeks or so.  Also indicates that he has been using some NSAIDs for pain related to his recent skin graft.  Occasional sees bright red blood on the toilet paper when he wipes.  Occasional nausea.    EGD 05/2020 was normal.    He said any colonoscopy would have been years ago.  Past Medical History:  Diagnosis Date   Acute osteomyelitis of metatarsal bone of left foot ([place])    Ambulates with cane    straight cane   Anemia    Cervical myelopathy (HCC) 02/06/2018   Chronic kidney disease    dailysis M W F- home   Community acquired pneumonia of left lower lobe of lung [id number]   Complication of anesthesia    Coronary artery disease    Dehiscence of amputation stump of left lower extremity (HCC)    Diabetes mellitus without complication ([place])    type2   Diabetic foot ulcer ([place])    Diabetic neuropathy ([place]) 02/06/2018   Dysrhythmia    Gait abnormality 08/24/2018   GERD (gastroesophageal reflux disease)     01/06/20- not current   Gout    History of blood transfusion    History of blood transfusion    History of kidney stones    passed stones   Hypercholesteremia    Hypertension     Hypothyroidism    Neuromuscular disorder (HCC)    neuropathy left leg and bilateral feet   Neuropathy    Partial nontraumatic amputation of foot, left ([place]) 02/20/2021   PONV (postoperative nausea and vomiting)    Prostate cancer ([name])    PVD (peripheral vascular disease) ([place])    with amputations    Past Surgical History:  Procedure Laterality Date   A-FLUTTER ABLATION N/A 04/06/2020   Procedure: A-FLUTTER ABLATION;  Surgeon: [name], MD;  Location: [facility];  Service: Cardiovascular;  Laterality: N/A;   AMPUTATION Left 12/25/2013   Procedure: AMPUTATION RAY LEFT 5TH RAY;  Surgeon: [name], MD;  Location: [facility];  Service: Orthopedics;  Laterality: Left;   AMPUTATION Right 12/15/2018   Procedure: AMPUTATION OF 4TH AND 5TH TOES RIGHT FOOT;  Surgeon: [name], MD;  Location: [facility];  Service: Orthopedics;  Laterality: Right;   AMPUTATION Left 02/02/2021   Procedure: LEFT FOOT 4TH RAY AMPUTATION;  Surgeon: [name], MD;  Location: [facility];  Service: Orthopedics;  Laterality: Left;   AMPUTATION Left 05/09/2021   Procedure: LEFT BELOW KNEE AMPUTATION;  Surgeon: [name], MD;  Location: [facility];  Service: Orthopedics;  Laterality: Left;   APPLICATION OF WOUND VAC Right 12/15/2018   Procedure: APPLICATION OF WOUND VAC;  Surgeon: [name], MD;  Location: [facility];  Service: Orthopedics;  Laterality: Right;   AV FISTULA PLACEMENT Left 03/25/2019   Procedure: LEFT ARM ARTERIOVENOUS (AV) FISTULA CREATION;  Surgeon: [name], MD;  Location: [facility];  Service: Vascular;  Laterality: Left;   [place] Left 05/20/2019   Procedure: SECOND STAGE LEFT BASCILIC VEIN TRANSPOSITION;  Surgeon: [name], MD;  Location: [facility];  Service: Vascular;  Laterality: Left;   CARDIAC CATHETERIZATION  02/17/2014   CHOLECYSTECTOMY     COLONOSCOPY  2011   in [place], [place]  2008   CYSTOSCOPY N/A  06/29/2020   Procedure: [place];  Surgeon: [name], MD;  Location: [facility];  Service: Urology;  Laterality: N/A;   CYSTOSCOPY WITH FULGERATION Bilateral 06/17/2020   Procedure: CYSTOSCOPY,BILATERAL RETROGRADE, CLOT EVACUATION WITH FULGERATION OF THE BLADDER;  Surgeon: Janith Lima, MD;  Location: [facility];  Service: Urology;  Laterality: Bilateral;   CYSTOSCOPY WITH FULGERATION N/A 06/28/2020   Procedure: CYSTOSCOPY WITH CLOT EVACUATION AND FULGERATION OF BLEEDERS;  Surgeon: [name], MD;  Location: [facility];  Service: Urology;  Laterality: N/A;  1 HR   I & D EXTREMITY Right 12/15/2018   Procedure: DEBRIDEMENT RIGHT FOOT;  Surgeon: [name], MD;  Location: [facility];  Service: Orthopedics;  Laterality: Right;   I & D EXTREMITY Right 08/27/2019   Procedure: PARTIAL CUBOID EXCISION RIGHT FOOT;  Surgeon: [name], MD;  Location: [facility];  Service: Orthopedics;  Laterality: Right;   I & D EXTREMITY Right 01/07/2020   Procedure: RIGHT FOOT EXCISION INFECTED BONE;  Surgeon: [name], MD;  Location: [facility];  Service: Orthopedics;  Laterality: Right;   I & D EXTREMITY Right 05/17/2022   Procedure: IRRIGATION AND DEBRIDEMENT RIGHT FOOT ABSCESS;  Surgeon: [name], MD;  Location: [facility];  Service: Orthopedics;  Laterality: Right;   NECK SURGERY     novemver 2019   STUMP REVISION Left 06/27/2021   Procedure: REVISION LEFT BELOW KNEE AMPUTATION;  Surgeon: [name], MD;  Location: [facility];  Service: Orthopedics;  Laterality: Left;   TRANSURETHRAL RESECTION OF PROSTATE N/A 06/28/2020   Procedure: TRANSURETHRAL RESECTION OF THE PROSTATE (TURP);  Surgeon: [name], MD;  Location: [facility];  Service: Urology;  Laterality: N/A;   WISDOM TOOTH EXTRACTION      Prior to Admission medications   Medication Sig Start Date End Date Taking? Authorizing Provider  acetaminophen (TYLENOL) 500 MG tablet Take 1,000 mg by mouth every 8 (eight) hours as  needed for mild pain or headache.   Yes [provider]  allopurinol (ZYLOPRIM) 100 MG tablet Take 2 tablets (200 mg total) by mouth daily. 05/22/21  Yes Angiulli, Lavon Paganini, PA-C  carvedilol (COREG) 12.5 MG tablet TAKE 1 TABLET BY MOUTH IN THE MORNING AND AT BEDTIME. 12/17/21  Yes Adrian Prows, MD  ibuprofen (ADVIL) 200 MG tablet Take 200 mg by mouth every 6 (six) hours as needed.   Yes [provider]  levothyroxine (SYNTHROID) 112 MCG tablet Take 1 tablet (112 mcg total) by mouth daily before breakfast. 05/22/21  Yes Angiulli, Lavon Paganini, PA-C  losartan (COZAAR) 50 MG tablet Take 50 mg by mouth 2 (two) times daily.   Yes [provider]  Methoxy PEG-Epoetin Beta (MIRCERA IJ) Inject into the skin. 09/14/21  Yes [provider]  modafinil (PROVIGIL) 200 MG tablet Take  1 tablet (200 mg total) by mouth daily. 03/11/22  Yes Raulkar, Clide Deutscher, MD  OZEMPIC, 0.25 OR 0.5 MG/DOSE, 2 MG/3ML SOPN Inject 0.5 mg into the skin once a week. 09/19/21  Yes [provider]  pantoprazole (PROTONIX) 40 MG tablet TAKE 1 TABLET BY MOUTH EVERY DAY 06/04/22  Yes Irene Shipper, MD  pregabalin (LYRICA) 150 MG capsule Take 1 capsule by mouth in the morning and 2 capsules at night. Patient taking differently: Take 150 mg by mouth 3 (three) times daily. 05/22/21  Yes Angiulli, Lavon Paganini, PA-C  prochlorperazine (COMPAZINE) 5 MG tablet TAKE 1 TABLET BY MOUTH TWICE A DAY 09/06/21  Yes Irene Shipper, MD  rosuvastatin (CRESTOR) 10 MG tablet Take 1 tablet (10 mg total) by mouth daily. 05/22/21  Yes Angiulli, Lavon Paganini, PA-C  sevelamer carbonate (RENVELA) 800 MG tablet Take 1 tablet (800 mg total) by mouth 3 (three) times daily with meals. Patient taking differently: Take 800 mg by mouth daily. 05/22/21  Yes Angiulli, Lavon Paganini, PA-C  Vitamin D, Ergocalciferol, (DRISDOL) 1.25 MG (50000 UNIT) CAPS capsule Take 1 capsule (50,000 Units total) by mouth every 7 (seven) days. 03/11/22  Yes Raulkar, Clide Deutscher, MD  B  Complex-C-Zn-Folic Acid (DIALYVITE Q000111Q WITH ZINC) 0.8 MG TABS Take 1 tablet by mouth daily. Patient not taking: Reported on 05/15/2022 03/13/22   Adrian Prows, MD  Darbepoetin Alfa (ARANESP) 100 MCG/0.5ML SOSY injection Inject 0.5 mLs (100 mcg total) into the vein every Thursday with hemodialysis. Patient not taking: Reported on 06/20/2022 05/24/21   Angiulli, Lavon Paganini, PA-C  Insulin Pen Needle 32G X 4 MM MISC Use to inject insulin up to 4 times daily as needed. 05/22/21   Raulkar, Clide Deutscher, MD  methocarbamol (ROBAXIN) 500 MG tablet Take 1 tablet (500 mg total) by mouth 3 (three) times daily. Patient not taking: Reported on 05/15/2022 07/23/21   [name], MD    Current Facility-Administered Medications  Medication Dose Route Frequency Provider Last Rate Last Admin   0.9 %  sodium chloride infusion (Manually program via Guardrails IV Fluids)   Intravenous Once Marcelyn Bruins, MD       acetaminophen (TYLENOL) tablet 650 mg  650 mg Oral Q6H PRN Marcelyn Bruins, MD       Or   acetaminophen (TYLENOL) suppository 650 mg  650 mg Rectal Q6H PRN Marcelyn Bruins, MD       allopurinol (ZYLOPRIM) tablet 200 mg  200 mg Oral Daily Marcelyn Bruins, MD       carvedilol (COREG) tablet 12.5 mg  12.5 mg Oral BID WC Marcelyn Bruins, MD   12.5 mg at 06/21/22 0809   insulin aspart (novoLOG) injection 0-6 Units  0-6 Units Subcutaneous TID WC Marcelyn Bruins, MD   1 Units at 06/21/22 0809   levothyroxine (SYNTHROID) tablet 112 mcg  112 mcg Oral QAC breakfast Marcelyn Bruins, MD   112 mcg at 06/21/22 0524   pantoprazole (PROTONIX) EC tablet 40 mg  40 mg Oral Daily Marcelyn Bruins, MD       polyethylene glycol (MIRALAX / GLYCOLAX) packet 17 g  17 g Oral Daily PRN Marcelyn Bruins, MD       pregabalin (LYRICA) capsule 150 mg  150 mg Oral TID Marcelyn Bruins, MD   150 mg at 06/20/22 2126   rosuvastatin (CRESTOR) tablet 10 mg  10 mg Oral Daily Marcelyn Bruins, MD       sevelamer  carbonate (RENVELA) tablet 800 mg  800 mg Oral Daily Marcelyn Bruins, MD   800 mg at 06/21/22 0809   sodium chloride flush (NS) 0.9 % injection 3 mL  3 mL Intravenous Q12H Marcelyn Bruins, MD   3 mL at 06/20/22 2040    Allergies as of 06/20/2022 - Review Complete 06/20/2022  Allergen Reaction Noted   Morphine  09/20/2021   Mushroom extract complex Nausea Only 09/14/2012    Family History  Problem Relation Age of Onset   Diabetes Mellitus II Mother    Kidney disease Mother    Diabetes Mellitus II Father    CAD Father    Cancer Father        prostate   Kidney disease Father    Diabetes Mellitus II Brother    Kidney disease Brother    Diabetes Mellitus II Brother    Stomach cancer Brother 1   Kidney disease Brother    Colon cancer Neg Hx    Colon polyps Neg Hx    Esophageal cancer Neg Hx    Rectal cancer Neg Hx    Pancreatic cancer Neg Hx     Social History   Socioeconomic History   Marital status: Married    Spouse name: Not on file   Number of children: 2   Years of education: Not on file   Highest education level: Not on file  Occupational History   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  Other Topics Concern   Not on file  Social History Narrative   Regular exercise: yes 3 times a week   Caffeine use: hot tea   Social Determinants of Health   Financial Resource Strain: Not on file  Food Insecurity: No Food Insecurity (06/20/2022)   Hunger Vital Sign    Worried About Running Out of Food in the Last Year: Never true    Ran Out of Food in the Last Year: Never true  Transportation Needs: No  Transportation Needs (06/20/2022)   PRAPARE - Hydrologist (Medical): No    Lack of Transportation (Non-Medical): No  Physical Activity: Not on file  Stress: Not on file  Social Connections: Not on file  Intimate Partner Violence: Not At Risk (06/20/2022)   Humiliation, Afraid, Rape, and Kick questionnaire    Fear of Current or Ex-Partner: No    Emotionally Abused: No    Physically Abused: No    Sexually Abused: No    Review of Systems: ROS is O/W negative except as mentioned in HPI.  Physical Exam: Vital signs in last 24 hours: Temp:  [97.9 F (36.6 C)-99.3 F (37.4 C)] 98.5 F (36.9 C) (03/15 0733) Pulse Rate:  [74-89] 81 (03/15 0733) Resp:  [16-33] 18 (03/15 0733) BP: (90-131)/(37-87) 117/54 (03/15 0733) SpO2:  [92 %-100 %] 97 % (03/15 0733) Weight:  [90.7 kg] 90.7 kg (03/15 0356) Last BM Date :  (PTA) General:  Alert, chronically ill-appearing, pleasant and cooperative in NAD Head:  Normocephalic and atraumatic. Eyes:  Sclera clear, no icterus.  Conjunctiva pink. Ears:  Normal auditory acuity. Mouth:  No deformity or lesions.   Lungs:  Clear throughout to auscultation.  No wheezes, crackles, or rhonchi.  Heart:  Regular rate and rhythm; no murmurs, clicks, rubs, or gallops. Abdomen:  Soft, non-distended.  BS present.  Non-tender.   Rectal:  Deferred.  Was heme negative here.  Msk:  Symmetrical without gross deformities. Pulses:  Normal pulses noted. Extremities:  Left BKA.  Right leg in brace. Neurologic:  Alert and oriented x 4;  grossly normal neurologically. Skin:  Intact without significant lesions or rashes. Psych:  Alert and cooperative. Normal mood and affect.  Intake/Output from previous day: 03/14 0701 - 03/15 0700 In: 1182.8 [I.V.:22.8; Blood:1160] Out: 300 [Urine:300]  Lab Results: Recent Labs    06/20/22 1140 06/20/22 1749 06/21/22 0107  WBC 9.8  --  6.7  HGB 4.9* 5.0* 5.5*  HCT 15.0* 16.3* 16.6*  PLT 116*  --  97*    BMET Recent Labs    06/20/22 1140 06/21/22 0107  NA 132* 133*  K 4.5 4.4  CL 89* 92*  CO2 29 27  GLUCOSE 142* 95  BUN 58* 65*  CREATININE 6.49* 7.81*  CALCIUM 8.9 8.0*   LFT Recent Labs    06/21/22 0107  PROT 5.4*  ALBUMIN 3.1*  AST 172*  ALT 178*  ALKPHOS 94  BILITOT 0.9   PT/INR Recent Labs    06/20/22 1140  LABPROT 17.6*  INR 1.5*   Studies/Results: CT ABDOMEN PELVIS WO CONTRAST  Result Date: 06/20/2022 CLINICAL DATA:  Intermittent abdominal pain for 4 days. EXAM: CT ABDOMEN AND PELVIS WITHOUT CONTRAST TECHNIQUE: Multidetector CT imaging of the abdomen and pelvis was performed following the standard protocol without IV contrast. RADIATION DOSE REDUCTION: This exam was performed according to the departmental dose-optimization program which includes automated exposure control, adjustment of the mA and/or kV according to patient size and/or use of iterative reconstruction technique. COMPARISON:  05/05/2020 FINDINGS: Lower chest: Small left pleural effusion noted. Minimal overlying atelectasis. The heart is normal in size. No pericardial effusion. Aortic and coronary artery calcifications are noted. Hepatobiliary: Slightly irregular liver contour and mildly enlarged caudate lobe may suggest changes of cirrhosis. No hepatic lesions are identified without contrast. No intrahepatic biliary dilatation. The gallbladder is surgically absent. No common bile duct dilatation. Pancreas: No mass, inflammation or ductal dilatation. Spleen: Normal size.  No focal lesions. Adrenals/Urinary Tract: Stable 3.8 x 3.3 cm right adrenal gland lesion containing macroscopic fat and consistent with a benign adrenal gland myelolipoma. The left adrenal gland is normal. Atrophic kidneys and patient on renal dialysis. Stable simple right renal cyst. This is not knee and further imaging evaluation or follow-up. The bladder is unremarkable. Stomach/Bowel: The stomach, duodenum, small and colon are grossly  normal. No acute inflammatory process, mass lesions or obstructive findings. The terminal ileum and appendix are normal. The sigmoid colon diverticulosis but no findings for acute diverticulitis. Vascular/Lymphatic: Advanced atherosclerotic calcification involving the aorta, iliac arteries and branch vessels but no aneurysm. Stable scattered mesenteric and retroperitoneal lymph nodes but no mass or adenopathy. Reproductive: The prostate gland contains radiopaque markers possibly related to prostate cancer or UroLift procedure. The seminal vesicles are. Other: Small amount of free scattered abdominal fluid and moderate free pelvic fluid which appears simple. This could be related to the patient's kidney disease or liver disease. Musculoskeletal: No significant bony findings. IMPRESSION: 1. No acute abdominal/pelvic findings, mass lesions or adenopathy. 2. Small amount  of free scattered abdominal fluid and moderate free pelvic fluid which appears simple. This could be related to the patient's kidney disease or liver disease. 3. Stable right adrenal gland myelolipoma. 4. Atrophic kidneys in patient on renal dialysis. 5. Small left pleural effusion with overlying atelectasis. 6. Slightly irregular liver contour and mildly enlarged caudate lobe may suggest changes of cirrhosis. 7. Status post cholecystectomy. No biliary dilatation. Aortic Atherosclerosis (ICD10-I70.0). Electronically Signed   By: Marijo Sanes M.D.   On: 06/20/2022 12:48   DG Chest Portable 1 View  Result Date: 06/20/2022 CLINICAL DATA:  72 year old male presents with cough. EXAM: PORTABLE CHEST 1 VIEW COMPARISON:  Aug 18, 2021 FINDINGS: Post median sternotomy for CABG with stable cardiomediastinal contours. Hilar structures are normal. Lungs are clear.  No visible pneumothorax. On limited assessment no acute skeletal process. IMPRESSION: Post median sternotomy for CABG without acute cardiopulmonary disease. Electronically Signed   By: Zetta Bills  M.D.   On: 06/20/2022 12:39    IMPRESSION:  *Anemia and heme negative stool: Hemoglobin 4.9 g, has received 3 units of blood cells.  Hemoccult negative here, but he does report dark stools at home and some NSAID use recently. *ESRD on HD M/W/F  PLAN: -Will plan for EGD on 3/16 to start.   -Monitor Hgb and transfuse further prn. -Pantoprazole 40 mg PO daily ok for now since no active bleeding.  Laban Emperor. Kushal Saunders  06/21/2022, 9:24 AM

## 2022-06-21 NOTE — Progress Notes (Signed)
PROGRESS NOTE    [name]  [id number] DOB: [birth year] DOA: 06/20/2022 PCP: Lorenda Hatchet, FNP    Brief Narrative:  72 year old with history of A-flutter status post ablation, thrombocytopenia, chronic anemia, left BKA, right great toe amputation, coronary artery status post CABG, prostate cancer, diabetes, hypothyroidism, hypertension, hyperlipidemia, gout, ESRD on hemodialysis presented with abdominal pain, nausea and melena.  About 1 week of worsening fatigue with intermittent abdominal pain and intermittent melanotic stools with occasional streaks of blood. Patient is MWF schedule for dialysis.  Patient also underwent skin grafting to his right lower extremity wound recently in orthopedic clinic. At the emergency room blood pressures fairly stable.  Hemoglobin 4.9 with recent baseline of 8.  FOBT negative.  Started on blood transfusion.  Currently on third unit of blood.  GI and nephrology consulted.   Assessment & Plan:   Symptomatic anemia, multifactorial. Recent multiple procedures, skin grafting and bleeding.  Also with melanotic stool and abdominal pain. Presentation hemoglobin 4.9-3 units of PRBC transfusion ordered.  Repeat hemoglobin.  May need to give more blood along with dialysis to avoid fluid overload. FOBT was negative, however suspect intermittent GI bleeding.  Patient to be seen by gastroenterology.  Remains on oral Protonix. Patient agreed for upper GI endoscopy.  He did not agree for colonoscopy because that needs for him to stay on clears and he does not want to do that today.  ESRD on hemodialysis: MWF schedule.  Nephrology on board.  For dialysis today.  A flutter status post ablation, paroxysmal A-fib.  Patient on A-fib but rate controlled.  Unable to anticoagulate.  Coronary artery disease: Status post CABG.  On carvedilol, rosuvastatin.  Losartan on hold due to low blood pressures.  Diabetes with diabetic neuropathy: Well-controlled.  On SSI.  On home  Lyrica.  Chronic medical issues including Hypothyroidism: On Synthroid.  Continue without changes. Gout: On allopurinol.  Continue without changes GERD: On PPI.  As above.  Continue without changes. Hyperlipidemia: On rosuvastatin. Left BKA: Stable. Right foot debridement with recent skin grafting.  Notify Dr. [name].  Examined wound.  Nonstick dressing.    DVT prophylaxis: SCDs Start: 06/20/22 1714   Code Status: Full code Family Communication: None at the bedside Disposition Plan: Status is: Observation The patient will require care spanning > 2 midnights and should be moved to inpatient because: Active blood transfusions, dialysis, inpatient procedures     Consultants:  Nephrology Gastroenterology Orthopedics, Dr. [name]  Procedures:  None  Antimicrobials:  None   Subjective: Patient seen and examined in the morning rounds.  Examined with GI provider at the bedside.  He was very eager to eat a regular diet and could not stay on liquid diet for early colonoscopy as needed. I examined his right plantar wound which looks with granulation tissue and minimal secretions. Denies any nausea vomiting or abdominal pain.  Wondering about dialysis. Completed third unit of PRBC transfusion and awaiting for repeat labs.  Objective: Vitals:   06/21/22 0600 06/21/22 0613 06/21/22 0628 06/21/22 0733  BP: (!) 116/54 111/87 (!) 110/56 (!) 117/54  Pulse: 85 83 82 81  Resp: 19 19 20 18   Temp: 97.9 F (36.6 C) 97.9 F (36.6 C) 98.1 F (36.7 C) 98.5 F (36.9 C)  TempSrc: Oral Oral Oral Oral  SpO2: 100% 97% 96% 97%  Weight:      Height:        Intake/Output Summary (Last 24 hours) at 06/21/2022 0755 Last data filed at 06/21/2022 [id number] Gross  per 24 hour  Intake 1182.83 ml  Output 300 ml  Net 882.83 ml   Filed Weights   06/20/22 1136 06/21/22 0356  Weight: 90.7 kg 90.7 kg    Examination:  General: Frail and debilitated.  Fairly comfortable and interactive. Cardiovascular:  S1-S2 normal.  Regular rate rhythm. Respiratory: Bilateral clear.  No added sounds. Gastrointestinal: Soft.  Not reviewed was not present. Ext: Left BKA stump with prosthesis.  Right foot with deformity, plantar aspect wound with pictured below.      Data Reviewed: I have personally reviewed following labs and imaging studies  CBC: Recent Labs  Lab 06/20/22 1140 06/20/22 1749 06/21/22 0107  WBC 9.8  --  6.7  NEUTROABS 8.1*  --   --   HGB 4.9* 5.0* 5.5*  HCT 15.0* 16.3* 16.6*  MCV 95.5  --  96.0  PLT 116*  --  97*   Basic Metabolic Panel: Recent Labs  Lab 06/20/22 1140 06/21/22 0107  NA 132* 133*  K 4.5 4.4  CL 89* 92*  CO2 29 27  GLUCOSE 142* 95  BUN 58* 65*  CREATININE 6.49* 7.81*  CALCIUM 8.9 8.0*   GFR: Estimated Creatinine Clearance: 9.7 mL/min (A) (by C-G formula based on SCr of 7.81 mg/dL (H)). Liver Function Tests: Recent Labs  Lab 06/20/22 1140 06/21/22 0107  AST 200* 172*  ALT 168* 178*  ALKPHOS 86 94  BILITOT 0.8 0.9  PROT 6.1* 5.4*  ALBUMIN 4.1 3.1*   Recent Labs  Lab 06/20/22 1140  LIPASE 147*   No results for input(s): "AMMONIA" in the last 168 hours. Coagulation Profile: Recent Labs  Lab 06/20/22 1140  INR 1.5*   Cardiac Enzymes: No results for input(s): "CKTOTAL", "CKMB", "CKMBINDEX", "TROPONINI" in the last 168 hours. BNP (last 3 results) No results for input(s): "PROBNP" in the last 8760 hours. HbA1C: No results for input(s): "HGBA1C" in the last 72 hours. CBG: Recent Labs  Lab 06/20/22 1139 06/20/22 2102 06/21/22 0606  GLUCAP 136* 177* 169*   Lipid Profile: No results for input(s): "CHOL", "HDL", "LDLCALC", "TRIG", "CHOLHDL", "LDLDIRECT" in the last 72 hours. Thyroid Function Tests: Recent Labs    06/20/22 1205  TSH 4.452   Anemia Panel: No results for input(s): "VITAMINB12", "FOLATE", "FERRITIN", "TIBC", "IRON", "RETICCTPCT" in the last 72 hours. Sepsis Labs: No results for input(s): "PROCALCITON",  "LATICACIDVEN" in the last 168 hours.  Recent Results (from the past 240 hour(s))  Resp panel by RT-PCR (RSV, Flu A&B, Covid) Anterior Nasal Swab     Status: None   Collection Time: 06/20/22 12:05 PM   Specimen: Anterior Nasal Swab  Result Value Ref Range Status   SARS Coronavirus 2 by RT PCR NEGATIVE NEGATIVE Final    Comment: (NOTE) SARS-CoV-2 target nucleic acids are NOT DETECTED.  The SARS-CoV-2 RNA is generally detectable in upper respiratory specimens during the acute phase of infection. The lowest concentration of SARS-CoV-2 viral copies this assay can detect is 138 copies/mL. A negative result does not preclude SARS-Cov-2 infection and should not be used as the sole basis for treatment or other patient management decisions. A negative result may occur with  improper specimen collection/handling, submission of specimen other than nasopharyngeal swab, presence of viral mutation(s) within the areas targeted by this assay, and inadequate number of viral copies(<138 copies/mL). A negative result must be combined with clinical observations, patient history, and epidemiological information. The expected result is Negative.  Fact Sheet for Patients:  EntrepreneurPulse.com.au  Fact Sheet for Healthcare Providers:  IncredibleEmployment.be  This test is no t yet approved or cleared by the [place] and  has been authorized for detection and/or diagnosis of SARS-CoV-2 by FDA under an Emergency Use Authorization (EUA). This EUA will remain  in effect (meaning this test can be used) for the duration of the COVID-19 declaration under Section 564(b)(1) of the Act, 21 U.S.C.section 360bbb-3(b)(1), unless the authorization is terminated  or revoked sooner.       Influenza A by PCR NEGATIVE NEGATIVE Final   Influenza B by PCR NEGATIVE NEGATIVE Final    Comment: (NOTE) The Xpert Xpress SARS-CoV-2/FLU/RSV plus assay is intended as an aid in  the diagnosis of influenza from Nasopharyngeal swab specimens and should not be used as a sole basis for treatment. Nasal washings and aspirates are unacceptable for Xpert Xpress SARS-CoV-2/FLU/RSV testing.  Fact Sheet for Patients: EntrepreneurPulse.com.au  Fact Sheet for Healthcare Providers: IncredibleEmployment.be  This test is not yet approved or cleared by the [place] FDA and has been authorized for detection and/or diagnosis of SARS-CoV-2 by FDA under an Emergency Use Authorization (EUA). This EUA will remain in effect (meaning this test can be used) for the duration of the COVID-19 declaration under Section 564(b)(1) of the Act, 21 U.S.C. section 360bbb-3(b)(1), unless the authorization is terminated or revoked.     Resp Syncytial Virus by PCR NEGATIVE NEGATIVE Final    Comment: (NOTE) Fact Sheet for Patients: EntrepreneurPulse.com.au  Fact Sheet for Healthcare Providers: IncredibleEmployment.be  This test is not yet approved or cleared by the [place] FDA and has been authorized for detection and/or diagnosis of SARS-CoV-2 by FDA under an Emergency Use Authorization (EUA). This EUA will remain in effect (meaning this test can be used) for the duration of the COVID-19 declaration under Section 564(b)(1) of the Act, 21 U.S.C. section 360bbb-3(b)(1), unless the authorization is terminated or revoked.  Performed at [facility], [address] [place], [place] [address]          Radiology Studies: CT ABDOMEN PELVIS WO CONTRAST  Result Date: 06/20/2022 CLINICAL DATA:  Intermittent abdominal pain for 4 days. EXAM: CT ABDOMEN AND PELVIS WITHOUT CONTRAST TECHNIQUE: Multidetector CT imaging of the abdomen and pelvis was performed following the standard protocol without IV contrast. RADIATION DOSE REDUCTION: This exam was performed according to the departmental  dose-optimization program which includes automated exposure control, adjustment of the mA and/or kV according to patient size and/or use of iterative reconstruction technique. COMPARISON:  05/05/2020 FINDINGS: Lower chest: Small left pleural effusion noted. Minimal overlying atelectasis. The heart is normal in size. No pericardial effusion. Aortic and coronary artery calcifications are noted. Hepatobiliary: Slightly irregular liver contour and mildly enlarged caudate lobe may suggest changes of cirrhosis. No hepatic lesions are identified without contrast. No intrahepatic biliary dilatation. The gallbladder is surgically absent. No common bile duct dilatation. Pancreas: No mass, inflammation or ductal dilatation. Spleen: Normal size.  No focal lesions. Adrenals/Urinary Tract: Stable 3.8 x 3.3 cm right adrenal gland lesion containing macroscopic fat and consistent with a benign adrenal gland myelolipoma. The left adrenal gland is normal. Atrophic kidneys and patient on renal dialysis. Stable simple right renal cyst. This is not knee and further imaging evaluation or follow-up. The bladder is unremarkable. Stomach/Bowel: The stomach, duodenum, small and colon are grossly normal. No acute inflammatory process, mass lesions or obstructive findings. The terminal ileum and appendix are normal. The sigmoid colon diverticulosis but no findings for acute diverticulitis. Vascular/Lymphatic: Advanced atherosclerotic calcification involving the aorta,  iliac arteries and branch vessels but no aneurysm. Stable scattered mesenteric and retroperitoneal lymph nodes but no mass or adenopathy. Reproductive: The prostate gland contains radiopaque markers possibly related to prostate cancer or UroLift procedure. The seminal vesicles are. Other: Small amount of free scattered abdominal fluid and moderate free pelvic fluid which appears simple. This could be related to the patient's kidney disease or liver disease. Musculoskeletal: No  significant bony findings. IMPRESSION: 1. No acute abdominal/pelvic findings, mass lesions or adenopathy. 2. Small amount of free scattered abdominal fluid and moderate free pelvic fluid which appears simple. This could be related to the patient's kidney disease or liver disease. 3. Stable right adrenal gland myelolipoma. 4. Atrophic kidneys in patient on renal dialysis. 5. Small left pleural effusion with overlying atelectasis. 6. Slightly irregular liver contour and mildly enlarged caudate lobe may suggest changes of cirrhosis. 7. Status post cholecystectomy. No biliary dilatation. Aortic Atherosclerosis (ICD10-I70.0). Electronically Signed   By: Marijo Sanes M.D.   On: 06/20/2022 12:48   DG Chest Portable 1 View  Result Date: 06/20/2022 CLINICAL DATA:  72 year old male presents with cough. EXAM: PORTABLE CHEST 1 VIEW COMPARISON:  Aug 18, 2021 FINDINGS: Post median sternotomy for CABG with stable cardiomediastinal contours. Hilar structures are normal. Lungs are clear.  No visible pneumothorax. On limited assessment no acute skeletal process. IMPRESSION: Post median sternotomy for CABG without acute cardiopulmonary disease. Electronically Signed   By: Zetta Bills M.D.   On: 06/20/2022 12:39        Scheduled Meds:  sodium chloride   Intravenous Once   allopurinol  200 mg Oral Daily   carvedilol  12.5 mg Oral BID WC   insulin aspart  0-6 Units Subcutaneous TID WC   levothyroxine  112 mcg Oral QAC breakfast   pantoprazole  40 mg Oral Daily   pregabalin  150 mg Oral TID   rosuvastatin  10 mg Oral Daily   sevelamer carbonate  800 mg Oral Daily   sodium chloride flush  3 mL Intravenous Q12H   Continuous Infusions:   LOS: 0 days    Time spent: 40 minutes    Barb Merino, MD [facility] Pager [phone]

## 2022-06-21 NOTE — Progress Notes (Signed)
Phlebotomist drew Hgb. Awaiting for lab results. Will continue to monitor.

## 2022-06-21 NOTE — Progress Notes (Signed)
Hgb post-transfusion was 5.5. Provider was notified via page Aileen Fass ), awaiting for orders.

## 2022-06-21 NOTE — Progress Notes (Signed)
3 unit of RBC of the night infusing. Vital signs taken, WNL. Patient denies any complaints at this time. Will continue to monitor.

## 2022-06-22 ENCOUNTER — Inpatient Hospital Stay (HOSPITAL_COMMUNITY): Payer: Medicare Other | Admitting: Anesthesiology

## 2022-06-22 ENCOUNTER — Encounter (HOSPITAL_COMMUNITY): Payer: Self-pay | Admitting: Internal Medicine

## 2022-06-22 ENCOUNTER — Encounter (HOSPITAL_COMMUNITY)
Admission: EM | Disposition: A | Discharge status: Home Health | Payer: Self-pay | Source: Home / Self Care | Attending: Internal Medicine

## 2022-06-22 DIAGNOSIS — Z992 Dependence on renal dialysis: Secondary | ICD-10-CM

## 2022-06-22 DIAGNOSIS — K31819 Angiodysplasia of stomach and duodenum without bleeding: Secondary | ICD-10-CM

## 2022-06-22 DIAGNOSIS — K5520 Angiodysplasia of colon without hemorrhage: Secondary | ICD-10-CM

## 2022-06-22 DIAGNOSIS — I120 Hypertensive chronic kidney disease with stage 5 chronic kidney disease or end stage renal disease: Secondary | ICD-10-CM

## 2022-06-22 DIAGNOSIS — K3189 Other diseases of stomach and duodenum: Secondary | ICD-10-CM

## 2022-06-22 DIAGNOSIS — D649 Anemia, unspecified: Secondary | ICD-10-CM | POA: Diagnosis not present

## 2022-06-22 DIAGNOSIS — K317 Polyp of stomach and duodenum: Secondary | ICD-10-CM

## 2022-06-22 DIAGNOSIS — I2510 Atherosclerotic heart disease of native coronary artery without angina pectoris: Secondary | ICD-10-CM

## 2022-06-22 DIAGNOSIS — Z87891 Personal history of nicotine dependence: Secondary | ICD-10-CM

## 2022-06-22 DIAGNOSIS — D132 Benign neoplasm of duodenum: Secondary | ICD-10-CM

## 2022-06-22 DIAGNOSIS — K2289 Other specified disease of esophagus: Secondary | ICD-10-CM

## 2022-06-22 DIAGNOSIS — N186 End stage renal disease: Secondary | ICD-10-CM

## 2022-06-22 HISTORY — PX: SUBMUCOSAL TATTOO INJECTION: SHX6856

## 2022-06-22 HISTORY — PX: HEMOSTASIS CLIP PLACEMENT: SHX6857

## 2022-06-22 HISTORY — PX: HOT HEMOSTASIS: SHX5433

## 2022-06-22 HISTORY — PX: ENTEROSCOPY: SHX5533

## 2022-06-22 HISTORY — PX: BIOPSY: SHX5522

## 2022-06-22 HISTORY — PX: SUBMUCOSAL LIFTING INJECTION: SHX6855

## 2022-06-22 HISTORY — PX: POLYPECTOMY: SHX5525

## 2022-06-22 LAB — BPAM RBC
Blood Product Expiration Date: 202404112359
Blood Product Expiration Date: 202404112359
Blood Product Expiration Date: 202404112359
Blood Product Expiration Date: 202404122359
ISSUE DATE / TIME: 202403142003
ISSUE DATE / TIME: 202403150325
ISSUE DATE / TIME: 202403150608
ISSUE DATE / TIME: 202403151605
Unit Type and Rh: 5100
Unit Type and Rh: 5100
Unit Type and Rh: 5100
Unit Type and Rh: 5100

## 2022-06-22 LAB — CBC WITH DIFFERENTIAL/PLATELET
Abs Immature Granulocytes: 0.18 10*3/uL — ABNORMAL HIGH (ref 0.00–0.07)
Basophils Absolute: 0.1 10*3/uL (ref 0.0–0.1)
Basophils Relative: 1 %
Eosinophils Absolute: 0.2 10*3/uL (ref 0.0–0.5)
Eosinophils Relative: 3 %
HCT: 27.7 % — ABNORMAL LOW (ref 39.0–52.0)
Hemoglobin: 9.4 g/dL — ABNORMAL LOW (ref 13.0–17.0)
Immature Granulocytes: 3 %
Lymphocytes Relative: 6 %
Lymphs Abs: 0.4 10*3/uL — ABNORMAL LOW (ref 0.7–4.0)
MCH: 31.6 pg (ref 26.0–34.0)
MCHC: 33.9 g/dL (ref 30.0–36.0)
MCV: 93.3 fL (ref 80.0–100.0)
Monocytes Absolute: 0.7 10*3/uL (ref 0.1–1.0)
Monocytes Relative: 11 %
Neutro Abs: 5 10*3/uL (ref 1.7–7.7)
Neutrophils Relative %: 76 %
Platelets: 79 10*3/uL — ABNORMAL LOW (ref 150–400)
RBC: 2.97 MIL/uL — ABNORMAL LOW (ref 4.22–5.81)
RDW: 18.9 % — ABNORMAL HIGH (ref 11.5–15.5)
WBC: 6.4 10*3/uL (ref 4.0–10.5)
nRBC: 0.3 % — ABNORMAL HIGH (ref 0.0–0.2)

## 2022-06-22 LAB — GLUCOSE, CAPILLARY
Glucose-Capillary: 126 mg/dL — ABNORMAL HIGH (ref 70–99)
Glucose-Capillary: 123 mg/dL — ABNORMAL HIGH (ref 70–99)
Glucose-Capillary: 179 mg/dL — ABNORMAL HIGH (ref 70–99)
Glucose-Capillary: 82 mg/dL (ref 70–99)
Glucose-Capillary: 138 mg/dL — ABNORMAL HIGH (ref 70–99)

## 2022-06-22 LAB — RENAL FUNCTION PANEL
Albumin: 2.9 g/dL — ABNORMAL LOW (ref 3.5–5.0)
Anion gap: 14 (ref 5–15)
BUN: 41 mg/dL — ABNORMAL HIGH (ref 8–23)
CO2: 25 mmol/L (ref 22–32)
Calcium: 8.3 mg/dL — ABNORMAL LOW (ref 8.9–10.3)
Chloride: 97 mmol/L — ABNORMAL LOW (ref 98–111)
Creatinine, Ser: 6.65 mg/dL — ABNORMAL HIGH (ref 0.61–1.24)
GFR, Estimated: 8 mL/min — ABNORMAL LOW (ref >60)
Glucose, Bld: 123 mg/dL — ABNORMAL HIGH (ref 70–99)
Phosphorus: 4.8 mg/dL — ABNORMAL HIGH (ref 2.5–4.6)
Potassium: 4.2 mmol/L (ref 3.5–5.1)
Sodium: 136 mmol/L (ref 135–145)

## 2022-06-22 LAB — TYPE AND SCREEN
ABO/RH(D): O POS
Antibody Screen: NEGATIVE
Unit division: 0
Unit division: 0
Unit division: 0
Unit division: 0

## 2022-06-22 LAB — HEPATITIS B SURFACE ANTIBODY, QUANTITATIVE: Hep B S AB Quant (Post): <3.1 m[IU]/mL — ABNORMAL LOW (ref >9.9)

## 2022-06-22 SURGERY — SUBMUCOSAL TATTOO INJECTION
Anesthesia: Monitor Anesthesia Care

## 2022-06-22 MED ORDER — PEG-KCL-NACL-NASULF-NA ASC-C 100 G PO SOLR
0.5000 | Freq: Once | ORAL | Status: AC
  Administered 2022-06-22: 100 g via ORAL
  Filled 2022-06-22: qty 1

## 2022-06-22 MED ORDER — PROPOFOL 500 MG/50ML IV EMUL
INTRAVENOUS | Status: DC | PRN
  Administered 2022-06-22: 75 ug/kg/min via INTRAVENOUS

## 2022-06-22 MED ORDER — PEG-KCL-NACL-NASULF-NA ASC-C 100 G PO SOLR: 1.0000 | Freq: Once | ORAL | Status: DC

## 2022-06-22 MED ORDER — PHENYLEPHRINE HCL-NACL 20-0.9 MG/250ML-% IV SOLN
INTRAVENOUS | Status: DC | PRN
  Administered 2022-06-22: 40 ug/min via INTRAVENOUS

## 2022-06-22 MED ORDER — PHENYLEPHRINE 80 MCG/ML (10ML) SYRINGE FOR IV PUSH (FOR BLOOD PRESSURE SUPPORT)
PREFILLED_SYRINGE | INTRAVENOUS | Status: DC | PRN
  Administered 2022-06-22: 160 ug via INTRAVENOUS
  Administered 2022-06-22: 80 ug via INTRAVENOUS

## 2022-06-22 MED ORDER — BISACODYL 5 MG PO TBEC
10.0000 mg | DELAYED_RELEASE_TABLET | Freq: Once | ORAL | Status: AC
  Administered 2022-06-22: 10 mg via ORAL
  Filled 2022-06-22: qty 2

## 2022-06-22 MED ORDER — BISACODYL 5 MG PO TBEC
10.0000 mg | DELAYED_RELEASE_TABLET | Freq: Once | ORAL | Status: AC
  Administered 2022-06-23: 10 mg via ORAL
  Filled 2022-06-22: qty 2

## 2022-06-22 MED ORDER — LIDOCAINE 2% (20 MG/ML) 5 ML SYRINGE
INTRAMUSCULAR | Status: DC | PRN
  Administered 2022-06-22: 60 mg via INTRAVENOUS

## 2022-06-22 MED ORDER — SPOT INK MARKER SYRINGE KIT
PACK | SUBMUCOSAL | Status: DC | PRN
  Administered 2022-06-22: 2.5 mL via SUBMUCOSAL

## 2022-06-22 MED ORDER — PEG-KCL-NACL-NASULF-NA ASC-C 100 G PO SOLR
0.5000 | Freq: Once | ORAL | Status: AC
  Administered 2022-06-23: 100 g via ORAL
  Filled 2022-06-22: qty 1

## 2022-06-22 MED ORDER — PANTOPRAZOLE SODIUM 40 MG PO TBEC
40.0000 mg | DELAYED_RELEASE_TABLET | Freq: Two times a day (BID) | ORAL | Status: DC
  Administered 2022-06-22 – 2022-06-23 (×2): 40 mg via ORAL
  Filled 2022-06-22 (×2): qty 1

## 2022-06-22 SURGICAL SUPPLY — 15 items

## 2022-06-22 NOTE — Anesthesia Postprocedure Evaluation (Signed)
Anesthesia Post Note  Patient: [name]  Procedure(s) Performed: ESOPHAGOGASTRODUODENOSCOPY (EGD) WITH PROPOFOL SUBMUCOSAL TATTOO INJECTION HOT HEMOSTASIS (ARGON PLASMA COAGULATION/BICAP) HEMOSTASIS CLIP PLACEMENT BIOPSY POLYPECTOMY ENTEROSCOPY     Patient location during evaluation: Endoscopy Anesthesia Type: MAC Level of consciousness: oriented, awake and alert and awake Pain management: pain level controlled Vital Signs Assessment: post-procedure vital signs reviewed and stable Respiratory status: spontaneous breathing, nonlabored ventilation, respiratory function stable and patient connected to nasal cannula oxygen Cardiovascular status: blood pressure returned to baseline and stable Postop Assessment: no headache, no backache and no apparent nausea or vomiting Anesthetic complications: no   No notable events documented.  Last Vitals:  Vitals:   06/22/22 1115 06/22/22 1516  BP: (!) 109/48 (!) 105/56  Pulse: (!) 54 (!) 58  Resp: 18 18  Temp: 36.7 C 36.5 C  SpO2: 91% 96%    Last Pain:  Vitals:   06/22/22 1516  TempSrc: Oral  PainSc:                  Santa Lighter

## 2022-06-22 NOTE — Final Progress Note (Signed)
Pt picked up at 0810 for EGD via wheelchair.

## 2022-06-22 NOTE — Interval H&P Note (Signed)
History and Physical Interval Note:  06/22/2022 8:34 AM  [name]  has presented today for surgery, with the diagnosis of Anemia, black stool.  The various methods of treatment have been discussed with the patient and family. After consideration of risks, benefits and other options for treatment, the patient has consented to  Procedure(s): ESOPHAGOGASTRODUODENOSCOPY (EGD) WITH PROPOFOL (N/A) as a surgical intervention.  The patient's history has been reviewed, patient examined, no change in status, stable for surgery.  I have reviewed the patient's chart and labs.  Questions were answered to the patient's satisfaction.    Plan for endoscopy/enteroscopy to evaluate for PUD and potential AVMs.  [employer]

## 2022-06-22 NOTE — Progress Notes (Addendum)
[facility] Progress Note   Subjective: Seen post endo. HGB 9.4 and so far, stable. This makes him happy. No further bleeding reported.     Objective Vitals:   06/22/22 0911 06/22/22 0915 06/22/22 0930 06/22/22 1115  BP: (!) 106/46 (!) 105/54 (!) 118/50 (!) 109/48  Pulse: 61 65 66 (!) 54  Resp: 17 17 18 18   Temp: (!) 97.2 F (36.2 C)  97.6 F (36.4 C) 98 F (36.7 C)  TempSrc:    Oral  SpO2: 97% 94% 96% 91%  Weight:      Height:       General: Chronically ill appearing elderly male in no acute distress. Lungs: Clear bilaterally to auscultation without wheezes, rales, or rhonchi. Breathing is unlabored. Heart: RRR with S1 S2. No murmurs, rubs, or gallops appreciated. Abdomen: Soft, NT, NABS Lower extremities:L BKA wearing prosthesis. Brace RLE Wound bottom of  R foot not visualized-ace wrap intact.  Neuro: Alert and oriented X 3. Moves all extremities spontaneously. Dialysis Access: L AVF + T/B  Additional Objective Labs: Basic Metabolic Panel: Recent Labs  Lab 06/20/22 1140 06/21/22 0107 06/22/22 0809  NA 132* 133* 136  K 4.5 4.4 4.2  CL 89* 92* 97*  CO2 29 27 25   GLUCOSE 142* 95 123*  BUN 58* 65* 41*  CREATININE 6.49* 7.81* 6.65*  CALCIUM 8.9 8.0* 8.3*  PHOS  --   --  4.8*   Liver Function Tests: Recent Labs  Lab 06/20/22 1140 06/21/22 0107 06/22/22 0809  AST 200* 172*  --   ALT 168* 178*  --   ALKPHOS 86 94  --   BILITOT 0.8 0.9  --   PROT 6.1* 5.4*  --   ALBUMIN 4.1 3.1* 2.9*   Recent Labs  Lab 06/20/22 1140  LIPASE 147*   CBC: Recent Labs  Lab 06/20/22 1140 06/20/22 1749 06/21/22 0107 06/21/22 1219 06/22/22 0809  WBC 9.8  --  6.7 5.9 6.4  NEUTROABS 8.1*  --   --   --  5.0  HGB 4.9*   < > 5.5* 7.4* 9.4*  HCT 15.0*   < > 16.6* 21.8* 27.7*  MCV 95.5  --  96.0 93.2 93.3  PLT 116*  --  97* 81* 79*   < > = values in this interval not displayed.   Blood Culture    Component Value Date/Time   SDES TISSUE 05/17/2022 1046    SPECREQUEST RT FOOT 05/17/2022 1046   CULT  05/17/2022 1046    MODERATE PROTEUS MIRABILIS FEW ENTEROCOCCUS FAECALIS FEW STAPHYLOCOCCUS AUREUS ABUNDANT BACTEROIDES SPECIES NOT FRAGILIS BETA LACTAMASE POSITIVE Performed at [facility], [place] [address] [place], [place] [address]    REPTSTATUS 05/21/2022 FINAL 05/17/2022 1046    Cardiac Enzymes: No results for input(s): "CKTOTAL", "CKMB", "CKMBINDEX", "TROPONINI" in the last 168 hours. CBG: Recent Labs  Lab 06/21/22 1147 06/21/22 2131 06/22/22 0604 06/22/22 0917 06/22/22 1116  GLUCAP 164* 181* 126* 123* 179*   Iron Studies: No results for input(s): "IRON", "TIBC", "TRANSFERRIN", "FERRITIN" in the last 72 hours. @lablastinr3 @ Studies/Results: CT ABDOMEN PELVIS WO CONTRAST  Result Date: 06/20/2022 CLINICAL DATA:  Intermittent abdominal pain for 4 days. EXAM: CT ABDOMEN AND PELVIS WITHOUT CONTRAST TECHNIQUE: Multidetector CT imaging of the abdomen and pelvis was performed following the standard protocol without IV contrast. RADIATION DOSE REDUCTION: This exam was performed according to the departmental dose-optimization program which includes automated exposure control, adjustment of the mA and/or kV according to patient size and/or  use of iterative reconstruction technique. COMPARISON:  05/05/2020 FINDINGS: Lower chest: Small left pleural effusion noted. Minimal overlying atelectasis. The heart is normal in size. No pericardial effusion. Aortic and coronary artery calcifications are noted. Hepatobiliary: Slightly irregular liver contour and mildly enlarged caudate lobe may suggest changes of cirrhosis. No hepatic lesions are identified without contrast. No intrahepatic biliary dilatation. The gallbladder is surgically absent. No common bile duct dilatation. Pancreas: No mass, inflammation or ductal dilatation. Spleen: Normal size.  No focal lesions. Adrenals/Urinary Tract: Stable 3.8 x 3.3 cm right adrenal gland lesion containing  macroscopic fat and consistent with a benign adrenal gland myelolipoma. The left adrenal gland is normal. Atrophic kidneys and patient on renal dialysis. Stable simple right renal cyst. This is not knee and further imaging evaluation or follow-up. The bladder is unremarkable. Stomach/Bowel: The stomach, duodenum, small and colon are grossly normal. No acute inflammatory process, mass lesions or obstructive findings. The terminal ileum and appendix are normal. The sigmoid colon diverticulosis but no findings for acute diverticulitis. Vascular/Lymphatic: Advanced atherosclerotic calcification involving the aorta, iliac arteries and branch vessels but no aneurysm. Stable scattered mesenteric and retroperitoneal lymph nodes but no mass or adenopathy. Reproductive: The prostate gland contains radiopaque markers possibly related to prostate cancer or UroLift procedure. The seminal vesicles are. Other: Small amount of free scattered abdominal fluid and moderate free pelvic fluid which appears simple. This could be related to the patient's kidney disease or liver disease. Musculoskeletal: No significant bony findings. IMPRESSION: 1. No acute abdominal/pelvic findings, mass lesions or adenopathy. 2. Small amount of free scattered abdominal fluid and moderate free pelvic fluid which appears simple. This could be related to the patient's kidney disease or liver disease. 3. Stable right adrenal gland myelolipoma. 4. Atrophic kidneys in patient on renal dialysis. 5. Small left pleural effusion with overlying atelectasis. 6. Slightly irregular liver contour and mildly enlarged caudate lobe may suggest changes of cirrhosis. 7. Status post cholecystectomy. No biliary dilatation. Aortic Atherosclerosis (ICD10-I70.0). Electronically Signed   By: Marijo Sanes M.D.   On: 06/20/2022 12:48   DG Chest Portable 1 View  Result Date: 06/20/2022 CLINICAL DATA:  72 year old male presents with cough. EXAM: PORTABLE CHEST 1 VIEW COMPARISON:   Aug 18, 2021 FINDINGS: Post median sternotomy for CABG with stable cardiomediastinal contours. Hilar structures are normal. Lungs are clear.  No visible pneumothorax. On limited assessment no acute skeletal process. IMPRESSION: Post median sternotomy for CABG without acute cardiopulmonary disease. Electronically Signed   By: Zetta Bills M.D.   On: 06/20/2022 12:39   Medications:  ferric gluconate (FERRLECIT) IVPB 125 mg (06/21/22 1706)    sodium chloride   Intravenous Once   sodium chloride   Intravenous Once   allopurinol  200 mg Oral Daily   bisacodyl  10 mg Oral Once   [START ON 06/23/2022] bisacodyl  10 mg Oral Once   calcitRIOL  0.5 mcg Oral Daily   carvedilol  12.5 mg Oral BID WC   feeding supplement (NEPRO CARB STEADY)  237 mL Oral Q24H   insulin aspart  0-6 Units Subcutaneous TID WC   levothyroxine  112 mcg Oral QAC breakfast   pantoprazole  40 mg Oral BID AC   peg 3350 powder  0.5 kit Oral Once   And   [START ON 06/23/2022] peg 3350 powder  0.5 kit Oral Once   pregabalin  150 mg Oral TID   rosuvastatin  10 mg Oral Daily   sevelamer carbonate  800 mg Oral TID  WC   sodium chloride flush  3 mL Intravenous Q12H     Dialysis Orders: [facility] HD: W,F Sunday  3 hrs 48 min Nx Stage 2.0 K 45 lactate volume per tx 60 liters Max FF: 73% BFR 425 DFR 15.8 L/Hr. Max UFR0.89 ml/kg/hr EDW 88 kg       Assessment/Plan: IB/ABLA-W/U per primary/GI. HGB 4.9 on admission, then 7.4 S/P 3 units PRBCs. Transfuse 1 more units in HD 06/21/2022. HGB 9.4 this AM. Upper Endo this AM.  2 small areas of angiectasias found, argon coag done. Going for colonoscopy in AM.   ESRD -  Home HD 3 X weekly. Will have HD MWF while here. K+ 4.0 use 2.0 K bath. No heparin. Next HD 06/24/2022 hopefully at home.   Hypertension/volume  - BP soft. On losartan mg PO BID, Carvedilol 25 mg PO daily at home per home therapy med list. . Losartan currently on hold. Carvedilol dose has been changed to 12.5 mg PO  BID. No evidence of volume overload by exam. UF as tolerated. He is about 2 kgs above OP EDW.   Anemia  - As noted in #1. Follow HGB, transfuse as necessary. OP Iron profile 06/10/22 Fe 58 Tsat 22%. Continue Fe load.   Metabolic bone disease -  Continue sevelamer binders, VDRA. Add PO4 to labs.   Nutrition - Changed to renal/Carb mod diet. Albumin low. Add protein supplements.  DMT2- management per primary H/O Atrial Flutter-not on [facility] Renal Recommendations: Do not use morphine in ESRD pt. Avoid baclofen. Renal dose all antibiotics and antivirals to appropriate creatinine clearance.   Rita H. Brown NP-C 06/22/2022, 11:37 AM  [facility] [phone]   Seen and examined independently this AM prior to procedure.  Agree with note and exam as documented above by physician extender and as noted here.  He refused tele overnight per charting - encouraged him to thoughtfully consider the team's recommendations.  Then states he hopes he goes home soon however he wants this to be fixed before he goes home.  General adult male in bed in no acute distress HEENT normocephalic atraumatic extraocular movements intact sclera anicteric Neck supple trachea midline Lungs clear to auscultation bilaterally normal work of breathing at rest on room air  Heart S1S2 no rub Abdomen soft nontender nondistended Extremities right foot is wrapped; no edema; left BKA Psych normal mood and affect Neuro - alert; conversant and follows commands Access:  LUE AVF with bruit and thrill   Acute GI bleed  - EGD today   Acute blood loss anemia as well as chronic disease - s/p 3 units PRBC's  - need to clarify last outpatient ESA   ESRD  - HD per MWF schedue    HTN - controlled on current regimen    Metabolic bone disease - on sevelamer. phos acceptable   History of atrial flutter - not on anticoagulation    Transaminitis - per primary team   Disposition - per primary team discretion   Claudia Desanctis, MD 06/22/2022 2:43 PM

## 2022-06-22 NOTE — Anesthesia Procedure Notes (Signed)
Procedure Name: MAC Date/Time: 06/22/2022 8:35 AM  Performed by: Kyung Rudd, CRNAPre-anesthesia Checklist: Patient identified, Emergency Drugs available, Suction available and Patient being monitored Patient Re-evaluated:Patient Re-evaluated prior to induction Oxygen Delivery Method: Nasal cannula Induction Type: IV induction Placement Confirmation: positive ETCO2 Dental Injury: Teeth and Oropharynx as per pre-operative assessment

## 2022-06-22 NOTE — Progress Notes (Signed)
PROGRESS NOTE    [name]  [id number] DOB: Oct 29, 1950 DOA: 06/20/2022 PCP: Lorenda Hatchet, FNP    Brief Narrative:  72 year old with history of A-flutter status post ablation, thrombocytopenia, chronic anemia, left BKA, right great toe amputation, coronary artery status post CABG, prostate cancer, diabetes, hypothyroidism, hypertension, hyperlipidemia, gout, ESRD on hemodialysis presented with abdominal pain, nausea and melena.  About 1 week of worsening fatigue with intermittent abdominal pain and intermittent melanotic stools with occasional streaks of blood. Patient is MWF schedule for dialysis.  Patient also underwent skin grafting to his right lower extremity wound recently in orthopedic clinic. At the emergency room blood pressures fairly stable.  Hemoglobin 4.9 with recent baseline of 8.  FOBT negative.  Started on blood transfusion.  GI and nephrology consulted.   Assessment & Plan:   Symptomatic anemia, multifactorial.  Suspect intermittent GI bleeding.  Presented with melanotic stool and intermittent abdominal pain. Hemoglobin 4.9 3 units of PRBC transfusion on admission 2 additional units of PRBC transfusion with dialysis Hemoglobin 9.4 today.  No active evidence of bleeding.  Will continue to monitor every day. FOBT was negative. Underwent upper GI endoscopy today and found to have multiple AVMs, duodenal polyp.  Treated with cauterization and coagulation.  No clear source of bleeding.  Patient needs colonoscopy.  Scheduled for tomorrow.  ESRD on hemodialysis: MWF schedule.  Nephrology on board.    A flutter status post ablation, paroxysmal A-fib.  Patient on A-fib but rate controlled.  Unable to anticoagulate.  Coronary artery disease: Status post CABG.  On carvedilol, rosuvastatin.  Losartan on hold due to low blood pressures.  Diabetes with diabetic neuropathy: Well-controlled.  On SSI.  On home Lyrica.  Chronic medical issues including Hypothyroidism: On  Synthroid.  Continue without changes. Gout: On allopurinol.  Continue without changes GERD: On PPI.  As above.  Continue without changes. Hyperlipidemia: On rosuvastatin. Left BKA: Stable.  Right foot debridement with recent skin grafting.  Local wound care.  Dr. [name] notified.   DVT prophylaxis: SCDs Start: 06/20/22 1714   Code Status: Full code Family Communication: None at the bedside Disposition Plan: Status is: Inpatient.  Significant anemia.  Transfusions.  Inpatient procedures.   Consultants:  Nephrology Gastroenterology Orthopedics, Dr. [name]  Procedures:  EGD  Antimicrobials:  None   Subjective:  Came back from procedure.  Denies any complaints.  He wants me to promise him that if colonoscopy shows no major finding tomorrow I shall discharge him.  Looking forward to eat real food after colonoscopy tomorrow.  Objective: Vitals:   06/22/22 0911 06/22/22 0915 06/22/22 0930 06/22/22 1115  BP: (!) 106/46 (!) 105/54 (!) 118/50 (!) 109/48  Pulse: 61 65 66 (!) 54  Resp: 17 17 18 18   Temp: (!) 97.2 F (36.2 C)  97.6 F (36.4 C) 98 F (36.7 C)  TempSrc:    Oral  SpO2: 97% 94% 96% 91%  Weight:      Height:        Intake/Output Summary (Last 24 hours) at 06/22/2022 1357 Last data filed at 06/22/2022 0915 Gross per 24 hour  Intake 353.13 ml  Output 2500 ml  Net -2146.87 ml   Filed Weights   06/21/22 1402 06/21/22 1829 06/22/22 0424  Weight: 93.6 kg 92.4 kg 92.4 kg    Examination:  General: Frail debilitated.  Pleasant interactive today. Cardiovascular: S1-S2 normal.  Regular rate rhythm. Respiratory: Bilateral clear. Gastrointestinal: Soft.  Nontender bowel sound present. Ext: Left BKA stump with prosthesis.  Right foot with deformity on walking frame.  Plantar wound as in picture.     Data Reviewed: I have personally reviewed following labs and imaging studies  CBC: Recent Labs  Lab 06/20/22 1140 06/20/22 1749 06/21/22 0107 06/21/22 1219  06/22/22 0809  WBC 9.8  --  6.7 5.9 6.4  NEUTROABS 8.1*  --   --   --  5.0  HGB 4.9* 5.0* 5.5* 7.4* 9.4*  HCT 15.0* 16.3* 16.6* 21.8* 27.7*  MCV 95.5  --  96.0 93.2 93.3  PLT 116*  --  97* 81* 79*   Basic Metabolic Panel: Recent Labs  Lab 06/20/22 1140 06/21/22 0107 06/22/22 0809  NA 132* 133* 136  K 4.5 4.4 4.2  CL 89* 92* 97*  CO2 29 27 25   GLUCOSE 142* 95 123*  BUN 58* 65* 41*  CREATININE 6.49* 7.81* 6.65*  CALCIUM 8.9 8.0* 8.3*  PHOS  --   --  4.8*   GFR: Estimated Creatinine Clearance: 11.5 mL/min (A) (by C-G formula based on SCr of 6.65 mg/dL (H)). Liver Function Tests: Recent Labs  Lab 06/20/22 1140 06/21/22 0107 06/22/22 0809  AST 200* 172*  --   ALT 168* 178*  --   ALKPHOS 86 94  --   BILITOT 0.8 0.9  --   PROT 6.1* 5.4*  --   ALBUMIN 4.1 3.1* 2.9*   Recent Labs  Lab 06/20/22 1140  LIPASE 147*   No results for input(s): "AMMONIA" in the last 168 hours. Coagulation Profile: Recent Labs  Lab 06/20/22 1140  INR 1.5*   Cardiac Enzymes: No results for input(s): "CKTOTAL", "CKMB", "CKMBINDEX", "TROPONINI" in the last 168 hours. BNP (last 3 results) No results for input(s): "PROBNP" in the last 8760 hours. HbA1C: No results for input(s): "HGBA1C" in the last 72 hours. CBG: Recent Labs  Lab 06/21/22 1147 06/21/22 2131 06/22/22 0604 06/22/22 0917 06/22/22 1116  GLUCAP 164* 181* 126* 123* 179*   Lipid Profile: No results for input(s): "CHOL", "HDL", "LDLCALC", "TRIG", "CHOLHDL", "LDLDIRECT" in the last 72 hours. Thyroid Function Tests: Recent Labs    06/20/22 1205  TSH 4.452   Anemia Panel: No results for input(s): "VITAMINB12", "FOLATE", "FERRITIN", "TIBC", "IRON", "RETICCTPCT" in the last 72 hours. Sepsis Labs: No results for input(s): "PROCALCITON", "LATICACIDVEN" in the last 168 hours.  Recent Results (from the past 240 hour(s))  Resp panel by RT-PCR (RSV, Flu A&B, Covid) Anterior Nasal Swab     Status: None   Collection Time:  06/20/22 12:05 PM   Specimen: Anterior Nasal Swab  Result Value Ref Range Status   SARS Coronavirus 2 by RT PCR NEGATIVE NEGATIVE Final    Comment: (NOTE) SARS-CoV-2 target nucleic acids are NOT DETECTED.  The SARS-CoV-2 RNA is generally detectable in upper respiratory specimens during the acute phase of infection. The lowest concentration of SARS-CoV-2 viral copies this assay can detect is 138 copies/mL. A negative result does not preclude SARS-Cov-2 infection and should not be used as the sole basis for treatment or other patient management decisions. A negative result may occur with  improper specimen collection/handling, submission of specimen other than nasopharyngeal swab, presence of viral mutation(s) within the areas targeted by this assay, and inadequate number of viral copies(<138 copies/mL). A negative result must be combined with clinical observations, patient history, and epidemiological information. The expected result is Negative.  Fact Sheet for Patients:  EntrepreneurPulse.com.au  Fact Sheet for Healthcare Providers:  IncredibleEmployment.be  This test is no t yet approved or cleared  by the [place] and  has been authorized for detection and/or diagnosis of SARS-CoV-2 by FDA under an Emergency Use Authorization (EUA). This EUA will remain  in effect (meaning this test can be used) for the duration of the COVID-19 declaration under Section 564(b)(1) of the Act, 21 U.S.C.section 360bbb-3(b)(1), unless the authorization is terminated  or revoked sooner.       Influenza A by PCR NEGATIVE NEGATIVE Final   Influenza B by PCR NEGATIVE NEGATIVE Final    Comment: (NOTE) The Xpert Xpress SARS-CoV-2/FLU/RSV plus assay is intended as an aid in the diagnosis of influenza from Nasopharyngeal swab specimens and should not be used as a sole basis for treatment. Nasal washings and aspirates are unacceptable for Xpert Xpress  SARS-CoV-2/FLU/RSV testing.  Fact Sheet for Patients: EntrepreneurPulse.com.au  Fact Sheet for Healthcare Providers: IncredibleEmployment.be  This test is not yet approved or cleared by the [place] FDA and has been authorized for detection and/or diagnosis of SARS-CoV-2 by FDA under an Emergency Use Authorization (EUA). This EUA will remain in effect (meaning this test can be used) for the duration of the COVID-19 declaration under Section 564(b)(1) of the Act, 21 U.S.C. section 360bbb-3(b)(1), unless the authorization is terminated or revoked.     Resp Syncytial Virus by PCR NEGATIVE NEGATIVE Final    Comment: (NOTE) Fact Sheet for Patients: EntrepreneurPulse.com.au  Fact Sheet for Healthcare Providers: IncredibleEmployment.be  This test is not yet approved or cleared by the [place] FDA and has been authorized for detection and/or diagnosis of SARS-CoV-2 by FDA under an Emergency Use Authorization (EUA). This EUA will remain in effect (meaning this test can be used) for the duration of the COVID-19 declaration under Section 564(b)(1) of the Act, 21 U.S.C. section 360bbb-3(b)(1), unless the authorization is terminated or revoked.  Performed at [facility], [address] [place], [place] [address]          Radiology Studies: No results found.      Scheduled Meds:  sodium chloride   Intravenous Once   sodium chloride   Intravenous Once   allopurinol  200 mg Oral Daily   bisacodyl  10 mg Oral Once   [START ON 06/23/2022] bisacodyl  10 mg Oral Once   calcitRIOL  0.5 mcg Oral Daily   carvedilol  12.5 mg Oral BID WC   feeding supplement (NEPRO CARB STEADY)  237 mL Oral Q24H   insulin aspart  0-6 Units Subcutaneous TID WC   levothyroxine  112 mcg Oral QAC breakfast   pantoprazole  40 mg Oral BID AC   peg 3350 powder  0.5 kit Oral Once   And   [START ON  06/23/2022] peg 3350 powder  0.5 kit Oral Once   pregabalin  150 mg Oral TID   rosuvastatin  10 mg Oral Daily   sevelamer carbonate  800 mg Oral TID WC   sodium chloride flush  3 mL Intravenous Q12H   Continuous Infusions:  ferric gluconate (FERRLECIT) IVPB 125 mg (06/21/22 1706)     LOS: 1 day    Time spent: 35 minutes    Barb Merino, MD [facility] Pager [phone]

## 2022-06-22 NOTE — Op Note (Signed)
[facility] Patient Name: [name] Procedure Date : 06/22/2022 MRN: [id number] Attending MD: Justice Britain , MD, [id number] Date of Birth: 03-07-1951 CSN: [id number] Age: 72 Admit Type: Inpatient Procedure:                Small bowel enteroscopy Indications:              Acute post hemorrhagic anemia, Iron deficiency                            anemia secondary to chronic blood loss, Melena,                            Occult blood in stool Providers:                Justice Britain, MD, Grace Isaac, RN, Darliss Cheney, Technician Referring MD:             Inpatient medical service, Docia Chuck. Henrene Pastor, MD Medicines:                Monitored Anesthesia Care Complications:            No immediate complications. Estimated Blood Loss:     Estimated blood loss was minimal. Procedure:                Pre-Anesthesia Assessment:                           - Prior to the procedure, a History and Physical                            was performed, and patient medications and                            allergies were reviewed. The patient's tolerance of                            previous anesthesia was also reviewed. The risks                            and benefits of the procedure and the sedation                            options and risks were discussed with the patient.                            All questions were answered, and informed consent                            was obtained. Prior Anticoagulants: The patient has                            taken no anticoagulant or antiplatelet agents  except for NSAID medication. ASA Grade Assessment:                            III - A patient with severe systemic disease. After                            reviewing the risks and benefits, the patient was                            deemed in satisfactory condition to undergo the                            procedure.                            After obtaining informed consent, the endoscope was                            passed under direct vision. Throughout the                            procedure, the patient's blood pressure, pulse, and                            oxygen saturations were monitored continuously. The                            [id number]) Olympus peds colonoscope was                            introduced through the mouth, and advanced to the                            proximal jejunum. After obtaining informed consent,                            the endoscope was passed under direct vision.                            Throughout the procedure, the patient's blood                            pressure, pulse, and oxygen saturations were                            monitored continuously.The small bowel enteroscopy                            was accomplished without difficulty. The patient                            tolerated the procedure. Scope In: Scope Out: Findings:      No gross lesions were noted in the entire esophagus.      The Z-line was irregular and was  found 42 cm from the incisors.      Patchy mildly erythematous mucosa without bleeding was found in the       entire examined stomach. Biopsies were taken with a cold forceps for       histology and Helicobacter pylori testing.      Two diminutive angioectasias with no bleeding were found in the gastric       body. Coagulation for bleeding prevention using argon plasma was       successful.      A single 14 mm sessile polyp with no bleeding was found in the       second/third portion of the duodenum. Preparations were made for mucosal       resection. Demarcation of the lesion was performed with high-definition       white light and narrow band imaging to clearly identify the boundaries       of the lesion. Saline was injected to raise the lesion. Cold snare       mucosal resection was performed. Resection and retrieval were complete.        Resected tissue margins were examined and clear of polyp tissue. To       prevent bleeding after mucosal resection, two hemostatic clips were       successfully placed (MR conditional). Clip manufacturer: [employer]. There was no bleeding at the end of the procedure. Area was       tattooed with an injection of Spot (carbon black) just proximal to the       area.      A single spot with no bleeding was found in the fourth portion of the       duodenum. Fulguration to ablate the lesion to prevent bleeding by argon       plasma was successful.      Normal mucosa was otherwise found in the rest of the visualized duodenum.      Two angioectasias with no bleeding were found in the proximal jejunum.       Fulguration to ablate the lesion to prevent bleeding by argon plasma was       successful.      Otherwise normal mucosa was found in the jejunum. Area was tattooed with       an injection of Spot (carbon black) to demarcate distal extent of       today's enteroscopy. Impression:               - No gross lesions in the entire esophagus. Z-line                            irregular, 42 cm from the incisors.                           - Erythematous mucosa in the stomach. Biopsied.                           - Two non-bleeding angioectasias in the stomach.                            Treated with argon plasma coagulation (APC).                           -  A single duodenal polyp in D2/D3. Resected via                            mucosal resection and retrieved. Clips (MR                            conditional) were placed. Clip manufacturer: [employer]. Tattooed proximally.                           - A single spot with no bleeding in the duodenum.                            Treated with argon plasma coagulation (APC).                           - Normal mucosa was found otherwise in the                            visualized duodenum.                            - Two non-bleeding angioectasias in the jejunum.                            Treated with argon plasma coagulation (APC).                           - Otherwise normal mucosa was found in the jejunum.                            Tattooed. Distal extent of today's procedure Recommendation:           - The patient will be observed post-procedure,                            until all discharge criteria are met.                           - Return patient to hospital ward for ongoing care.                           - Discussion with patient has occurred and he                            agrees to move forward with colonoscopy (the                            findings today could suggest some reason for anemia                            but seems out of proportion and thus colonoscopy  would be recommended as it has been years since his                            last one).                           - Clear liquid diet for the rest of today. N.p.o.                            at midnight on 3/17.                           - Preparation orders for colonoscopy have been                            placed to begin this afternoon and continue into                            tomorrow morning. Orders have been placed in the                            chart.                           - Twice daily PPI for 1 month to decrease risk of                            postintervention bleeding from the endoscopic                            resection. Then may go back to once daily.                           - Pending the results of the pathology, will need                            follow-up endoscopy likely in 1 year if adenomatous                            tissue was found without high-grade changes; this                            could be pursued by his primary gastroenterologist.                           - The findings and recommendations were discussed                             with the patient.                           - The findings and recommendations were discussed  with the referring physician. Procedure Code(s):        --- Professional ---                           [phone], 89, Small intestinal endoscopy, enteroscopy                            beyond second portion of duodenum, not including                            ileum; with control of bleeding (eg, injection,                            bipolar cautery, unipolar cautery, laser, heater                            probe, stapler, plasma coagulator)                           44364, Small intestinal endoscopy, enteroscopy                            beyond second portion of duodenum, not including                            ileum; with removal of tumor(s), polyp(s), or other                            lesion(s) by snare technique                           44361, 86, Small intestinal endoscopy, enteroscopy                            beyond second portion of duodenum, not including                            ileum; with biopsy, single or multiple                           44799, Unlisted procedure, small intestine Diagnosis Code(s):        --- Professional ---                           K22.89, Other specified disease of esophagus                           K31.89, Other diseases of stomach and duodenum                           K31.819, Angiodysplasia of stomach and duodenum                            without bleeding  K31.7, Polyp of stomach and duodenum                           K55.20, Angiodysplasia of colon without hemorrhage                           D62, Acute posthemorrhagic anemia                           D50.0, Iron deficiency anemia secondary to blood                            loss (chronic)                           K92.1, Melena (includes Hematochezia)                           R19.5, Other fecal abnormalities CPT copyright 2022 American  Medical Association. All rights reserved. The codes documented in this report are preliminary and upon coder review may  be revised to meet current compliance requirements. Justice Britain, MD 06/22/2022 10:01:23 AM Number of Addenda: 0

## 2022-06-22 NOTE — Transfer of Care (Signed)
Immediate Anesthesia Transfer of Care Note  Patient: MUKUND ELLICK  Procedure(s) Performed: ESOPHAGOGASTRODUODENOSCOPY (EGD) WITH PROPOFOL SUBMUCOSAL TATTOO INJECTION HOT HEMOSTASIS (ARGON PLASMA COAGULATION/BICAP) HEMOSTASIS CLIP PLACEMENT BIOPSY  Patient Location: PACU  Anesthesia Type:MAC  Level of Consciousness: awake, alert , and oriented  Airway & Oxygen Therapy: Patient Spontanous Breathing  Post-op Assessment: Report given to RN, Post -op Vital signs reviewed and stable, and Patient moving all extremities  Post vital signs: Reviewed and stable  Last Vitals:  Vitals Value Taken Time  BP 106/46 06/22/22 0913  Temp 36.2 C 06/22/22 0911  Pulse 65 06/22/22 0915  Resp 16 06/22/22 0915  SpO2 94 % 06/22/22 0915  Vitals shown include unvalidated device data.  Last Pain:  Vitals:   06/22/22 0822  TempSrc: Temporal  PainSc: 0-No pain      Patients Stated Pain Goal: 0 ([id number])  Complications: No notable events documented.

## 2022-06-22 NOTE — Plan of Care (Signed)

## 2022-06-22 NOTE — Anesthesia Preprocedure Evaluation (Signed)
Anesthesia Evaluation  Patient identified by MRN, date of birth, ID band Patient awake    Reviewed: Allergy & Precautions, NPO status , Patient's Chart, lab work & pertinent test results  History of Anesthesia Complications (+) PONV and history of anesthetic complications  Airway Mallampati: III  TM Distance: >3 FB Neck ROM: Full    Dental  (+) Teeth Intact, Dental Advisory Given   Pulmonary neg pulmonary ROS, former smoker   Pulmonary exam normal breath sounds clear to auscultation       Cardiovascular hypertension, + CAD, + CABG and + Peripheral Vascular Disease  Normal cardiovascular exam Rhythm:Regular Rate:Normal  Echocardiogram 03/17/2020:  Mildly depressed LV systolic function with visual EF 40-45%. Left  ventricle cavity is normal in size. Moderate concentric hypertrophy of the  left ventricle. Left ventricle regional wall motion findings: Mid  inferoseptal, Apical septal and Apical cap hypokinesis. Unable to evaluate  diastolic function due to atrial fibrillation. Elevated LAP.  Left atrial cavity is severely dilated.  Aortic valve sclerosis without stenosis.  Mild (Grade I) mitral regurgitation.  Mild tricuspid regurgitation. No evidence of pulmonary hypertension. RVSP  measures 33 mmHg.  Mild pulmonic regurgitation.  IVC is dilated with blunted respiratory response.     Neuro/Psych  Neuromuscular disease    GI/Hepatic Neg liver ROS,GERD  ,,  Endo/Other  diabetes, Type 2, Oral Hypoglycemic AgentsHypothyroidism    Renal/GU ESRF and DialysisRenal disease (MWF)     Musculoskeletal  (+) Arthritis ,    Abdominal   Peds  Hematology  (+) Blood dyscrasia (Thrombocytopenia), anemia   Anesthesia Other Findings Day of surgery medications reviewed with the patient.  Reproductive/Obstetrics                              Anesthesia Physical Anesthesia Plan  ASA: 3  Anesthesia Plan:  MAC   Post-op Pain Management: Minimal or no pain anticipated   Induction: Intravenous  PONV Risk Score and Plan: 2 and TIVA and Treatment may vary due to age or medical condition  Airway Management Planned: Natural Airway and Simple Face Mask  Additional Equipment:   Intra-op Plan:   Post-operative Plan:   Informed Consent: I have reviewed the patients History and Physical, chart, labs and discussed the procedure including the risks, benefits and alternatives for the proposed anesthesia with the patient or authorized representative who has indicated his/her understanding and acceptance.     Dental advisory given  Plan Discussed with: CRNA  Anesthesia Plan Comments:          Anesthesia Quick Evaluation

## 2022-06-23 ENCOUNTER — Inpatient Hospital Stay (HOSPITAL_COMMUNITY): Payer: Medicare Other | Admitting: Anesthesiology

## 2022-06-23 ENCOUNTER — Encounter (HOSPITAL_COMMUNITY)
Admission: EM | Disposition: A | Discharge status: Home Health | Payer: Self-pay | Source: Home / Self Care | Attending: Internal Medicine

## 2022-06-23 DIAGNOSIS — K5730 Diverticulosis of large intestine without perforation or abscess without bleeding: Secondary | ICD-10-CM

## 2022-06-23 DIAGNOSIS — E1122 Type 2 diabetes mellitus with diabetic chronic kidney disease: Secondary | ICD-10-CM

## 2022-06-23 DIAGNOSIS — D123 Benign neoplasm of transverse colon: Secondary | ICD-10-CM

## 2022-06-23 DIAGNOSIS — I120 Hypertensive chronic kidney disease with stage 5 chronic kidney disease or end stage renal disease: Secondary | ICD-10-CM

## 2022-06-23 DIAGNOSIS — K635 Polyp of colon: Secondary | ICD-10-CM

## 2022-06-23 DIAGNOSIS — D124 Benign neoplasm of descending colon: Secondary | ICD-10-CM

## 2022-06-23 DIAGNOSIS — D120 Benign neoplasm of cecum: Secondary | ICD-10-CM

## 2022-06-23 DIAGNOSIS — D631 Anemia in chronic kidney disease: Secondary | ICD-10-CM

## 2022-06-23 DIAGNOSIS — I2510 Atherosclerotic heart disease of native coronary artery without angina pectoris: Secondary | ICD-10-CM

## 2022-06-23 DIAGNOSIS — K5790 Diverticulosis of intestine, part unspecified, without perforation or abscess without bleeding: Secondary | ICD-10-CM

## 2022-06-23 DIAGNOSIS — N186 End stage renal disease: Secondary | ICD-10-CM

## 2022-06-23 DIAGNOSIS — Z992 Dependence on renal dialysis: Secondary | ICD-10-CM

## 2022-06-23 DIAGNOSIS — K648 Other hemorrhoids: Secondary | ICD-10-CM

## 2022-06-23 DIAGNOSIS — D500 Iron deficiency anemia secondary to blood loss (chronic): Secondary | ICD-10-CM

## 2022-06-23 DIAGNOSIS — R195 Other fecal abnormalities: Secondary | ICD-10-CM

## 2022-06-23 DIAGNOSIS — D122 Benign neoplasm of ascending colon: Secondary | ICD-10-CM

## 2022-06-23 DIAGNOSIS — D649 Anemia, unspecified: Secondary | ICD-10-CM | POA: Diagnosis not present

## 2022-06-23 HISTORY — PX: COLONOSCOPY: SHX5424

## 2022-06-23 HISTORY — PX: POLYPECTOMY: SHX5525

## 2022-06-23 LAB — CBC
HCT: 28.9 % — ABNORMAL LOW (ref 39.0–52.0)
Hemoglobin: 9.4 g/dL — ABNORMAL LOW (ref 13.0–17.0)
MCH: 31.4 pg (ref 26.0–34.0)
MCHC: 32.5 g/dL (ref 30.0–36.0)
MCV: 96.7 fL (ref 80.0–100.0)
Platelets: 71 10*3/uL — ABNORMAL LOW (ref 150–400)
RBC: 2.99 MIL/uL — ABNORMAL LOW (ref 4.22–5.81)
RDW: 18.6 % — ABNORMAL HIGH (ref 11.5–15.5)
WBC: 6 10*3/uL (ref 4.0–10.5)
nRBC: 0 % (ref 0.0–0.2)

## 2022-06-23 LAB — BASIC METABOLIC PANEL
Anion gap: 19 — ABNORMAL HIGH (ref 5–15)
BUN: 50 mg/dL — ABNORMAL HIGH (ref 8–23)
CO2: 23 mmol/L (ref 22–32)
Calcium: 8.7 mg/dL — ABNORMAL LOW (ref 8.9–10.3)
Chloride: 95 mmol/L — ABNORMAL LOW (ref 98–111)
Creatinine, Ser: 8.25 mg/dL — ABNORMAL HIGH (ref 0.61–1.24)
GFR, Estimated: 6 mL/min — ABNORMAL LOW (ref >60)
Glucose, Bld: 111 mg/dL — ABNORMAL HIGH (ref 70–99)
Potassium: 4.5 mmol/L (ref 3.5–5.1)
Sodium: 137 mmol/L (ref 135–145)

## 2022-06-23 LAB — GLUCOSE, CAPILLARY
Glucose-Capillary: 110 mg/dL — ABNORMAL HIGH (ref 70–99)
Glucose-Capillary: 107 mg/dL — ABNORMAL HIGH (ref 70–99)
Glucose-Capillary: 98 mg/dL (ref 70–99)

## 2022-06-23 SURGERY — COLONOSCOPY
Anesthesia: Monitor Anesthesia Care

## 2022-06-23 MED ORDER — PHENYLEPHRINE 80 MCG/ML (10ML) SYRINGE FOR IV PUSH (FOR BLOOD PRESSURE SUPPORT)
PREFILLED_SYRINGE | INTRAVENOUS | Status: DC | PRN
  Administered 2022-06-23 (×2): 160 ug via INTRAVENOUS

## 2022-06-23 MED ORDER — PROPOFOL 500 MG/50ML IV EMUL
INTRAVENOUS | Status: DC | PRN
  Administered 2022-06-23: 100 ug/kg/min via INTRAVENOUS

## 2022-06-23 MED ORDER — PROPOFOL 10 MG/ML IV BOLUS
INTRAVENOUS | Status: DC | PRN
  Administered 2022-06-23: 20 mg via INTRAVENOUS

## 2022-06-23 MED ORDER — LIDOCAINE 2% (20 MG/ML) 5 ML SYRINGE
INTRAMUSCULAR | Status: DC | PRN
  Administered 2022-06-23: 40 mg via INTRAVENOUS

## 2022-06-23 MED ORDER — PHENYLEPHRINE HCL-NACL 20-0.9 MG/250ML-% IV SOLN
INTRAVENOUS | Status: DC | PRN
  Administered 2022-06-23: 50 ug/min via INTRAVENOUS

## 2022-06-23 MED ORDER — PANTOPRAZOLE SODIUM 40 MG PO TBEC: 40.0000 mg | DELAYED_RELEASE_TABLET | Freq: Two times a day (BID) | ORAL | 0 refills | Status: DC

## 2022-06-23 MED ORDER — SODIUM CHLORIDE 0.9 % IV SOLN: INTRAVENOUS | Status: DC

## 2022-06-23 NOTE — Interval H&P Note (Signed)
History and Physical Interval Note:  06/23/2022 8:26 AM  [name]  has presented today for surgery, with the diagnosis of Anemia, Dark stools.  The various methods of treatment have been discussed with the patient and family. After consideration of risks, benefits and other options for treatment, the patient has consented to  Procedure(s): COLONOSCOPY (N/A) as a surgical intervention.  The patient's history has been reviewed, patient examined, no change in status, stable for surgery.  I have reviewed the patient's chart and labs.  Questions were answered to the patient's satisfaction.     [name]

## 2022-06-23 NOTE — Discharge Summary (Signed)
Physician Discharge Summary  [name] [id number] DOB: Jun 25, 1950 DOA: 06/20/2022  PCP: Lorenda Hatchet, FNP  Admit date: 06/20/2022 Discharge date: 06/23/2022  Admitted From: Home Disposition: Home  Recommendations for Outpatient Follow-up:  Follow up dialysis center tomorrow Please obtain BMP/CBC in one week   Home Health: N/A Equipment/Devices: N/A  Discharge Condition: Stable CODE STATUS: Full code Diet recommendation: Low-salt diet  Discharge summary: 72 year old with history of A-flutter status post ablation, thrombocytopenia, chronic anemia, left BKA, right great toe amputation, coronary artery status post CABG, prostate cancer, diabetes, hypothyroidism, hypertension, hyperlipidemia, gout, ESRD on hemodialysis presented with abdominal pain, nausea and melena.  About 1 week of worsening fatigue with intermittent abdominal pain and intermittent melanotic stools with occasional streaks of blood. Patient is MWF schedule for dialysis.  Patient also underwent skin grafting to his right lower extremity wound recently in orthopedic clinic. At the emergency room blood pressures fairly stable.  Hemoglobin 4.9 with recent baseline of 8.  FOBT negative.  Started on blood transfusion.  GI and nephrology consulted.  Patient underwent blood transfusions, EGD and colonoscopies as below.  Medically stabilized today.  Able to go home.  Assessment & Plan:   Symptomatic anemia, multifactorial.  Suspect intermittent GI bleeding.  Presented with melanotic stool and intermittent abdominal pain. Hemoglobin 4.9 on presentation. Total 3 units of PRBC transfusion given.  Hemoglobin more than 9 and stable since then. FOBT was negative. Upper GI endoscopy found to have multiple AVMs, duodenal polyp.  Treated with cauterization and coagulation.  No clear source of bleeding.  Colonoscopy with multiple polyps, no clear source of bleeding.  Discharging with PPI.   ESRD on hemodialysis: MWF schedule.   He will dialyze tomorrow at home.   A flutter status post ablation, paroxysmal A-fib.  Patient on A-fib but rate controlled.  Unable to anticoagulate.   Coronary artery disease: Status post CABG.  On carvedilol, rosuvastatin.    Diabetes with diabetic neuropathy: Well-controlled.  On SSI.  On home Lyrica.   Chronic medical issues including Hypothyroidism: On Synthroid.  Continue without changes. Gout: On allopurinol.  Continue without changes GERD: On PPI.  As above.  Takes Protonix 40 mg daily, increasing to 40 mg twice daily. Hyperlipidemia: On rosuvastatin. Left BKA: Stable.   Right foot debridement with recent skin grafting.  Local wound care.  Dr. [name] will lock outpatient.  Stable.  Dressing instructions attached.  Stable for discharge.  Discharge Diagnoses:  Principal Problem:   Symptomatic anemia Active Problems:   ESRD on hemodialysis (HCC)   Paroxysmal atrial fibrillation (HCC)   CAD (coronary artery disease)   Insulin dependent type 2 diabetes mellitus ([place])   Hypertension associated with diabetes ([place])   Hyperlipidemia associated with type 2 diabetes mellitus ([place])   Gout   Hypothyroidism   Diabetic neuropathy ([place])   History of partial ray amputation of fourth toe of right foot (HCC)   Typical atrial flutter (HCC)   Anemia of chronic renal failure   S/P CABG (coronary artery bypass graft)   Anemia due to chronic blood loss   Heme positive stool   Benign neoplasm of cecum   Benign neoplasm of transverse colon   Benign neoplasm of descending colon    Discharge Instructions  Discharge Instructions     Diet - low sodium heart healthy   Complete by: As directed    Discharge wound care:   Complete by: As directed    Cover right foot wound with adaptic dressing (lawson # [phone]  or [phone]) top with ABD pads as bolster on wound bed, wrap with kerlix and ACE wrap.   Increase activity slowly   Complete by: As directed       Allergies as of 06/23/2022        Reactions   Morphine    Other reaction(s): Delusions (intolerance)   Mushroom Extract Complex Nausea Only        Medication List     STOP taking these medications    DIALYVITE 800 WITH ZINC 0.8 MG Tabs   ibuprofen 200 MG tablet Commonly known as: ADVIL   methocarbamol 500 MG tablet Commonly known as: ROBAXIN       TAKE these medications    acetaminophen 500 MG tablet Commonly known as: TYLENOL Take 1,000 mg by mouth every 8 (eight) hours as needed for mild pain or headache.   allopurinol 100 MG tablet Commonly known as: ZYLOPRIM Take 2 tablets (200 mg total) by mouth daily.   carvedilol 12.5 MG tablet Commonly known as: COREG TAKE 1 TABLET BY MOUTH IN THE MORNING AND AT BEDTIME.   Darbepoetin Alfa 100 MCG/0.5ML Sosy injection Commonly known as: ARANESP Inject 0.5 mLs (100 mcg total) into the vein every Thursday with hemodialysis.   levothyroxine 112 MCG tablet Commonly known as: SYNTHROID Take 1 tablet (112 mcg total) by mouth daily before breakfast.   losartan 50 MG tablet Commonly known as: COZAAR Take 50 mg by mouth 2 (two) times daily.   MIRCERA IJ Inject into the skin.   modafinil 200 MG tablet Commonly known as: PROVIGIL Take 1 tablet (200 mg total) by mouth daily.   Ozempic (0.25 or 0.5 MG/DOSE) 2 MG/3ML Sopn Generic drug: Semaglutide(0.25 or 0.5MG /DOS) Inject 0.5 mg into the skin once a week.   pantoprazole 40 MG tablet Commonly known as: PROTONIX Take 1 tablet (40 mg total) by mouth 2 (two) times daily before a meal. What changed: when to take this   Pentips 32G X 4 MM Misc Generic drug: Insulin Pen Needle Use to inject insulin up to 4 times daily as needed.   pregabalin 150 MG capsule Commonly known as: LYRICA Take 1 capsule by mouth in the morning and 2 capsules at night. What changed:  how much to take how to take this when to take this additional instructions   prochlorperazine 5 MG tablet Commonly known as: COMPAZINE TAKE  1 TABLET BY MOUTH TWICE A DAY   rosuvastatin 10 MG tablet Commonly known as: CRESTOR Take 1 tablet (10 mg total) by mouth daily.   sevelamer carbonate 800 MG tablet Commonly known as: RENVELA Take 1 tablet (800 mg total) by mouth 3 (three) times daily with meals. What changed: when to take this   Vitamin D (Ergocalciferol) 1.25 MG (50000 UNIT) Caps capsule Commonly known as: DRISDOL Take 1 capsule (50,000 Units total) by mouth every 7 (seven) days.               Discharge Care Instructions  (From admission, onward)           Start     Ordered   06/23/22 0000  Discharge wound care:       Comments: Cover right foot wound with adaptic dressing (lawson # [phone] or [phone]) top with ABD pads as bolster on wound bed, wrap with kerlix and ACE wrap.   06/23/22 0959            Allergies  Allergen Reactions   Morphine     Other reaction(s):  Delusions (intolerance)   Mushroom Extract Complex Nausea Only    Consultations: Gastroenterology Nephrology   Procedures/Studies: CT ABDOMEN PELVIS WO CONTRAST  Result Date: 06/20/2022 CLINICAL DATA:  Intermittent abdominal pain for 4 days. EXAM: CT ABDOMEN AND PELVIS WITHOUT CONTRAST TECHNIQUE: Multidetector CT imaging of the abdomen and pelvis was performed following the standard protocol without IV contrast. RADIATION DOSE REDUCTION: This exam was performed according to the departmental dose-optimization program which includes automated exposure control, adjustment of the mA and/or kV according to patient size and/or use of iterative reconstruction technique. COMPARISON:  05/05/2020 FINDINGS: Lower chest: Small left pleural effusion noted. Minimal overlying atelectasis. The heart is normal in size. No pericardial effusion. Aortic and coronary artery calcifications are noted. Hepatobiliary: Slightly irregular liver contour and mildly enlarged caudate lobe may suggest changes of cirrhosis. No hepatic lesions are identified without  contrast. No intrahepatic biliary dilatation. The gallbladder is surgically absent. No common bile duct dilatation. Pancreas: No mass, inflammation or ductal dilatation. Spleen: Normal size.  No focal lesions. Adrenals/Urinary Tract: Stable 3.8 x 3.3 cm right adrenal gland lesion containing macroscopic fat and consistent with a benign adrenal gland myelolipoma. The left adrenal gland is normal. Atrophic kidneys and patient on renal dialysis. Stable simple right renal cyst. This is not knee and further imaging evaluation or follow-up. The bladder is unremarkable. Stomach/Bowel: The stomach, duodenum, small and colon are grossly normal. No acute inflammatory process, mass lesions or obstructive findings. The terminal ileum and appendix are normal. The sigmoid colon diverticulosis but no findings for acute diverticulitis. Vascular/Lymphatic: Advanced atherosclerotic calcification involving the aorta, iliac arteries and branch vessels but no aneurysm. Stable scattered mesenteric and retroperitoneal lymph nodes but no mass or adenopathy. Reproductive: The prostate gland contains radiopaque markers possibly related to prostate cancer or UroLift procedure. The seminal vesicles are. Other: Small amount of free scattered abdominal fluid and moderate free pelvic fluid which appears simple. This could be related to the patient's kidney disease or liver disease. Musculoskeletal: No significant bony findings. IMPRESSION: 1. No acute abdominal/pelvic findings, mass lesions or adenopathy. 2. Small amount of free scattered abdominal fluid and moderate free pelvic fluid which appears simple. This could be related to the patient's kidney disease or liver disease. 3. Stable right adrenal gland myelolipoma. 4. Atrophic kidneys in patient on renal dialysis. 5. Small left pleural effusion with overlying atelectasis. 6. Slightly irregular liver contour and mildly enlarged caudate lobe may suggest changes of cirrhosis. 7. Status post  cholecystectomy. No biliary dilatation. Aortic Atherosclerosis (ICD10-I70.0). Electronically Signed   By: Marijo Sanes M.D.   On: 06/20/2022 12:48   DG Chest Portable 1 View  Result Date: 06/20/2022 CLINICAL DATA:  72 year old male presents with cough. EXAM: PORTABLE CHEST 1 VIEW COMPARISON:  Aug 18, 2021 FINDINGS: Post median sternotomy for CABG with stable cardiomediastinal contours. Hilar structures are normal. Lungs are clear.  No visible pneumothorax. On limited assessment no acute skeletal process. IMPRESSION: Post median sternotomy for CABG without acute cardiopulmonary disease. Electronically Signed   By: Zetta Bills M.D.   On: 06/20/2022 12:39   (Echo, Carotid, EGD, Colonoscopy, ERCP)    Subjective: Patient seen and examined.  He came back from colonoscopy.  Stated hungry and wanting real food.  He asked me to discharge him as soon as possible.  He wants to go home.  Colonoscopy findings discussed with patient.   Discharge Exam: Vitals:   06/23/22 0915 06/23/22 0930  BP: (!) 109/49 (!) 117/43  Pulse:  66  Resp:  16 17  Temp: 97.6 F (36.4 C) 97.7 F (36.5 C)  SpO2: 100% 100%   Vitals:   06/23/22 0737 06/23/22 0805 06/23/22 0915 06/23/22 0930  BP: (!) 123/51 (!) 99/51 (!) 109/49 (!) 117/43  Pulse: (!) 53 65  66  Resp: 17 17 16 17   Temp: 98.2 F (36.8 C) 97.9 F (36.6 C) 97.6 F (36.4 C) 97.7 F (36.5 C)  TempSrc: Oral Temporal    SpO2: 96% 96% 100% 100%  Weight:      Height:        General: Pt is alert, awake, not in acute distress Frail and debilitated. Cardiovascular: RRR, S1/S2 +, no rubs, no gallops Respiratory: CTA bilaterally, no wheezing, no rhonchi Abdominal: Soft, NT, ND, bowel sounds + Extremities:  Left BKA stump with  prosthesis.  Right foot with deformity, wearing walking frame.  Plantar wound present dressed.  Picture in the chart. Left upper extremity fistula present.    The results of significant diagnostics from this hospitalization  (including imaging, microbiology, ancillary and laboratory) are listed below for reference.     Microbiology: Recent Results (from the past 240 hour(s))  Resp panel by RT-PCR (RSV, Flu A&B, Covid) Anterior Nasal Swab     Status: None   Collection Time: 06/20/22 12:05 PM   Specimen: Anterior Nasal Swab  Result Value Ref Range Status   SARS Coronavirus 2 by RT PCR NEGATIVE NEGATIVE Final    Comment: (NOTE) SARS-CoV-2 target nucleic acids are NOT DETECTED.  The SARS-CoV-2 RNA is generally detectable in upper respiratory specimens during the acute phase of infection. The lowest concentration of SARS-CoV-2 viral copies this assay can detect is 138 copies/mL. A negative result does not preclude SARS-Cov-2 infection and should not be used as the sole basis for treatment or other patient management decisions. A negative result may occur with  improper specimen collection/handling, submission of specimen other than nasopharyngeal swab, presence of viral mutation(s) within the areas targeted by this assay, and inadequate number of viral copies(<138 copies/mL). A negative result must be combined with clinical observations, patient history, and epidemiological information. The expected result is Negative.  Fact Sheet for Patients:  EntrepreneurPulse.com.au  Fact Sheet for Healthcare Providers:  IncredibleEmployment.be  This test is no t yet approved or cleared by the [place] FDA and  has been authorized for detection and/or diagnosis of SARS-CoV-2 by FDA under an Emergency Use Authorization (EUA). This EUA will remain  in effect (meaning this test can be used) for the duration of the COVID-19 declaration under Section 564(b)(1) of the Act, 21 U.S.C.section 360bbb-3(b)(1), unless the authorization is terminated  or revoked sooner.       Influenza A by PCR NEGATIVE NEGATIVE Final   Influenza B by PCR NEGATIVE NEGATIVE Final    Comment:  (NOTE) The Xpert Xpress SARS-CoV-2/FLU/RSV plus assay is intended as an aid in the diagnosis of influenza from Nasopharyngeal swab specimens and should not be used as a sole basis for treatment. Nasal washings and aspirates are unacceptable for Xpert Xpress SARS-CoV-2/FLU/RSV testing.  Fact Sheet for Patients: EntrepreneurPulse.com.au  Fact Sheet for Healthcare Providers: IncredibleEmployment.be  This test is not yet approved or cleared by the [place] FDA and has been authorized for detection and/or diagnosis of SARS-CoV-2 by FDA under an Emergency Use Authorization (EUA). This EUA will remain in effect (meaning this test can be used) for the duration of the COVID-19 declaration under Section 564(b)(1) of the Act, 21 U.S.C. section 360bbb-3(b)(1), unless the authorization  is terminated or revoked.     Resp Syncytial Virus by PCR NEGATIVE NEGATIVE Final    Comment: (NOTE) Fact Sheet for Patients: EntrepreneurPulse.com.au  Fact Sheet for Healthcare Providers: IncredibleEmployment.be  This test is not yet approved or cleared by the [place] FDA and has been authorized for detection and/or diagnosis of SARS-CoV-2 by FDA under an Emergency Use Authorization (EUA). This EUA will remain in effect (meaning this test can be used) for the duration of the COVID-19 declaration under Section 564(b)(1) of the Act, 21 U.S.C. section 360bbb-3(b)(1), unless the authorization is terminated or revoked.  Performed at [facility], [address] [place], [place] [address]      Labs: BNP (last 3 results) No results for input(s): "BNP" in the last 8760 hours. Basic Metabolic Panel: Recent Labs  Lab 06/20/22 1140 06/21/22 0107 06/22/22 0809 06/23/22 0739  NA 132* 133* 136 137  K 4.5 4.4 4.2 4.5  CL 89* 92* 97* 95*  CO2 29 27 25 23   GLUCOSE 142* 95 123* 111*  BUN 58* 65* 41* 50*   CREATININE 6.49* 7.81* 6.65* 8.25*  CALCIUM 8.9 8.0* 8.3* 8.7*  PHOS  --   --  4.8*  --    Liver Function Tests: Recent Labs  Lab 06/20/22 1140 06/21/22 0107 06/22/22 0809  AST 200* 172*  --   ALT 168* 178*  --   ALKPHOS 86 94  --   BILITOT 0.8 0.9  --   PROT 6.1* 5.4*  --   ALBUMIN 4.1 3.1* 2.9*   Recent Labs  Lab 06/20/22 1140  LIPASE 147*   No results for input(s): "AMMONIA" in the last 168 hours. CBC: Recent Labs  Lab 06/20/22 1140 06/20/22 1749 06/21/22 0107 06/21/22 1219 06/22/22 0809 06/23/22 0739  WBC 9.8  --  6.7 5.9 6.4 6.0  NEUTROABS 8.1*  --   --   --  5.0  --   HGB 4.9* 5.0* 5.5* 7.4* 9.4* 9.4*  HCT 15.0* 16.3* 16.6* 21.8* 27.7* 28.9*  MCV 95.5  --  96.0 93.2 93.3 96.7  PLT 116*  --  97* 81* 79* 71*   Cardiac Enzymes: No results for input(s): "CKTOTAL", "CKMB", "CKMBINDEX", "TROPONINI" in the last 168 hours. BNP: Invalid input(s): "POCBNP" CBG: Recent Labs  Lab 06/22/22 1613 06/22/22 2135 06/23/22 0609 06/23/22 0735 06/23/22 0924  GLUCAP 82 138* 110* 107* 98   D-Dimer No results for input(s): "DDIMER" in the last 72 hours. Hgb A1c No results for input(s): "HGBA1C" in the last 72 hours. Lipid Profile No results for input(s): "CHOL", "HDL", "LDLCALC", "TRIG", "CHOLHDL", "LDLDIRECT" in the last 72 hours. Thyroid function studies Recent Labs    06/20/22 1205  TSH 4.452   Anemia work up No results for input(s): "VITAMINB12", "FOLATE", "FERRITIN", "TIBC", "IRON", "RETICCTPCT" in the last 72 hours. Urinalysis    Component Value Date/Time   COLORURINE YELLOW 08/19/2021 1240   APPEARANCEUR HAZY (A) 08/19/2021 1240   LABSPEC 1.016 08/19/2021 1240   PHURINE 6.0 08/19/2021 1240   GLUCOSEU NEGATIVE 08/19/2021 1240   HGBUR SMALL (A) 08/19/2021 1240   BILIRUBINUR NEGATIVE 08/19/2021 1240   KETONESUR NEGATIVE 08/19/2021 1240   PROTEINUR 100 (A) 08/19/2021 1240   UROBILINOGEN 0.2 12/28/2014 1031   NITRITE NEGATIVE 08/19/2021 1240    LEUKOCYTESUR NEGATIVE 08/19/2021 1240   Sepsis Labs Recent Labs  Lab 06/21/22 0107 06/21/22 1219 06/22/22 0809 06/23/22 0739  WBC 6.7 5.9 6.4 6.0   Microbiology Recent Results (from the past 240 hour(s))  Resp panel by RT-PCR (RSV, Flu A&B, Covid) Anterior Nasal Swab     Status: None   Collection Time: 06/20/22 12:05 PM   Specimen: Anterior Nasal Swab  Result Value Ref Range Status   SARS Coronavirus 2 by RT PCR NEGATIVE NEGATIVE Final    Comment: (NOTE) SARS-CoV-2 target nucleic acids are NOT DETECTED.  The SARS-CoV-2 RNA is generally detectable in upper respiratory specimens during the acute phase of infection. The lowest concentration of SARS-CoV-2 viral copies this assay can detect is 138 copies/mL. A negative result does not preclude SARS-Cov-2 infection and should not be used as the sole basis for treatment or other patient management decisions. A negative result may occur with  improper specimen collection/handling, submission of specimen other than nasopharyngeal swab, presence of viral mutation(s) within the areas targeted by this assay, and inadequate number of viral copies(<138 copies/mL). A negative result must be combined with clinical observations, patient history, and epidemiological information. The expected result is Negative.  Fact Sheet for Patients:  EntrepreneurPulse.com.au  Fact Sheet for Healthcare Providers:  IncredibleEmployment.be  This test is no t yet approved or cleared by the [place] FDA and  has been authorized for detection and/or diagnosis of SARS-CoV-2 by FDA under an Emergency Use Authorization (EUA). This EUA will remain  in effect (meaning this test can be used) for the duration of the COVID-19 declaration under Section 564(b)(1) of the Act, 21 U.S.C.section 360bbb-3(b)(1), unless the authorization is terminated  or revoked sooner.       Influenza A by PCR NEGATIVE NEGATIVE Final    Influenza B by PCR NEGATIVE NEGATIVE Final    Comment: (NOTE) The Xpert Xpress SARS-CoV-2/FLU/RSV plus assay is intended as an aid in the diagnosis of influenza from Nasopharyngeal swab specimens and should not be used as a sole basis for treatment. Nasal washings and aspirates are unacceptable for Xpert Xpress SARS-CoV-2/FLU/RSV testing.  Fact Sheet for Patients: EntrepreneurPulse.com.au  Fact Sheet for Healthcare Providers: IncredibleEmployment.be  This test is not yet approved or cleared by the [place] FDA and has been authorized for detection and/or diagnosis of SARS-CoV-2 by FDA under an Emergency Use Authorization (EUA). This EUA will remain in effect (meaning this test can be used) for the duration of the COVID-19 declaration under Section 564(b)(1) of the Act, 21 U.S.C. section 360bbb-3(b)(1), unless the authorization is terminated or revoked.     Resp Syncytial Virus by PCR NEGATIVE NEGATIVE Final    Comment: (NOTE) Fact Sheet for Patients: EntrepreneurPulse.com.au  Fact Sheet for Healthcare Providers: IncredibleEmployment.be  This test is not yet approved or cleared by the [place] FDA and has been authorized for detection and/or diagnosis of SARS-CoV-2 by FDA under an Emergency Use Authorization (EUA). This EUA will remain in effect (meaning this test can be used) for the duration of the COVID-19 declaration under Section 564(b)(1) of the Act, 21 U.S.C. section 360bbb-3(b)(1), unless the authorization is terminated or revoked.  Performed at [facility], [address] [place], [place] [address]      Time coordinating discharge: 35 minutes  SIGNED:   Barb Merino, MD  [facility] 06/23/2022, 10:00 AM

## 2022-06-23 NOTE — Plan of Care (Signed)

## 2022-06-23 NOTE — Anesthesia Preprocedure Evaluation (Addendum)
Anesthesia Evaluation  Patient identified by MRN, date of birth, ID band Patient awake    Reviewed: Allergy & Precautions, NPO status , Patient's Chart, lab work & pertinent test results  History of Anesthesia Complications (+) PONV and history of anesthetic complications  Airway Mallampati: III  TM Distance: >3 FB Neck ROM: Full    Dental  (+) Teeth Intact, Dental Advisory Given   Pulmonary former smoker   breath sounds clear to auscultation       Cardiovascular hypertension, + CAD, + CABG and + Peripheral Vascular Disease  + dysrhythmias Atrial Fibrillation  Rhythm:Irregular Rate:Normal  Echocardiogram 03/17/2020:  Mildly depressed LV systolic function with visual EF 40-45%. Left  ventricle cavity is normal in size. Moderate concentric hypertrophy of the  left ventricle. Left ventricle regional wall motion findings: Mid  inferoseptal, Apical septal and Apical cap hypokinesis. Unable to evaluate  diastolic function due to atrial fibrillation. Elevated LAP.  Left atrial cavity is severely dilated.  Aortic valve sclerosis without stenosis.  Mild (Grade I) mitral regurgitation.  Mild tricuspid regurgitation. No evidence of pulmonary hypertension. RVSP  measures 33 mmHg.  Mild pulmonic regurgitation.  IVC is dilated with blunted respiratory response.     Neuro/Psych  Neuromuscular disease    GI/Hepatic Neg liver ROS,GERD  ,,  Endo/Other  diabetes, Type 2, Oral Hypoglycemic AgentsHypothyroidism    Renal/GU ESRF and DialysisRenal disease (MWF)Last hd fri  Lab Results      Component                Value               Date                      NA                       136                 06/22/2022                K                        4.2                 06/22/2022                CO2                      25                  06/22/2022                GLUCOSE                  123 (H)             06/22/2022                 BUN                      41 (H)              06/22/2022                CREATININE               6.65 (H)  06/22/2022                CALCIUM                  8.3 (L)             06/22/2022                GFRNONAA                 8 (L)               06/22/2022                Musculoskeletal  (+) Arthritis ,    Abdominal   Peds  Hematology  (+) Blood dyscrasia (Thrombocytopenia), anemia Lab Results      Component                Value               Date                      WBC                      6.0                 06/23/2022                HGB                      9.4 (L)             06/23/2022                HCT                      28.9 (L)            06/23/2022                MCV                      96.7                06/23/2022                PLT                      PENDING             06/23/2022              Anesthesia Other Findings Day of surgery medications reviewed with the patient.  Reproductive/Obstetrics                             Anesthesia Physical Anesthesia Plan  ASA: 3  Anesthesia Plan: MAC   Post-op Pain Management: Minimal or no pain anticipated   Induction: Intravenous  PONV Risk Score and Plan: 2 and TIVA and Treatment may vary due to age or medical condition  Airway Management Planned: Natural Airway, Simple Face Mask and Nasal Cannula  Additional Equipment: None  Intra-op Plan:   Post-operative Plan:   Informed Consent: I have reviewed the patients History and Physical, chart, labs and discussed the procedure including the risks, benefits and alternatives for the proposed anesthesia with the patient or authorized representative who has indicated his/her  understanding and acceptance.     Dental advisory given  Plan Discussed with: CRNA  Anesthesia Plan Comments:         Anesthesia Quick Evaluation

## 2022-06-23 NOTE — Progress Notes (Addendum)
[facility] Progress Note   Subjective: Went for colonoscopy today, no obvious bleeding source found. HGB stable.    Objective Vitals:   06/23/22 0737 06/23/22 0805 06/23/22 0915 06/23/22 0930  BP: (!) 123/51 (!) 99/51 (!) 109/49 (!) 117/43  Pulse: (!) 53 65  66  Resp: 17 17 16 17   Temp: 98.2 F (36.8 C) 97.9 F (36.6 C) 97.6 F (36.4 C) 97.7 F (36.5 C)  TempSrc: Oral Temporal    SpO2: 96% 96% 100% 100%  Weight:      Height:       General: Chronically ill appearing elderly male in no acute distress. Lungs: Clear bilaterally to auscultation without wheezes, rales, or rhonchi. Breathing is unlabored. Heart: RRR with S1 S2. No murmurs, rubs, or gallops appreciated. Abdomen: Soft, NT, NABS Lower extremities:L BKA wearing prosthesis. Brace RLE Wound bottom of  R foot not visualized-ace wrap intact.  Neuro: Alert and oriented X 3. Moves all extremities spontaneously. Dialysis Access: L AVF + T/B  Additional Objective Labs: Basic Metabolic Panel: Recent Labs  Lab 06/21/22 0107 06/22/22 0809 06/23/22 0739  NA 133* 136 137  K 4.4 4.2 4.5  CL 92* 97* 95*  CO2 27 25 23   GLUCOSE 95 123* 111*  BUN 65* 41* 50*  CREATININE 7.81* 6.65* 8.25*  CALCIUM 8.0* 8.3* 8.7*  PHOS  --  4.8*  --    Liver Function Tests: Recent Labs  Lab 06/20/22 1140 06/21/22 0107 06/22/22 0809  AST 200* 172*  --   ALT 168* 178*  --   ALKPHOS 86 94  --   BILITOT 0.8 0.9  --   PROT 6.1* 5.4*  --   ALBUMIN 4.1 3.1* 2.9*   Recent Labs  Lab 06/20/22 1140  LIPASE 147*   CBC: Recent Labs  Lab 06/20/22 1140 06/20/22 1749 06/21/22 0107 06/21/22 1219 06/22/22 0809 06/23/22 0739  WBC 9.8  --  6.7 5.9 6.4 6.0  NEUTROABS 8.1*  --   --   --  5.0  --   HGB 4.9*   < > 5.5* 7.4* 9.4* 9.4*  HCT 15.0*   < > 16.6* 21.8* 27.7* 28.9*  MCV 95.5  --  96.0 93.2 93.3 96.7  PLT 116*  --  97* 81* 79* 71*   < > = values in this interval not displayed.   Blood Culture    Component Value  Date/Time   SDES TISSUE 05/17/2022 1046   SPECREQUEST RT FOOT 05/17/2022 1046   CULT  05/17/2022 1046    MODERATE PROTEUS MIRABILIS FEW ENTEROCOCCUS FAECALIS FEW STAPHYLOCOCCUS AUREUS ABUNDANT BACTEROIDES SPECIES NOT FRAGILIS BETA LACTAMASE POSITIVE Performed at [facility], [place] [address], [place], [place] [address]    REPTSTATUS 05/21/2022 FINAL 05/17/2022 1046    Cardiac Enzymes: No results for input(s): "CKTOTAL", "CKMB", "CKMBINDEX", "TROPONINI" in the last 168 hours. CBG: Recent Labs  Lab 06/22/22 1613 06/22/22 2135 06/23/22 0609 06/23/22 0735 06/23/22 0924  GLUCAP 82 138* 110* 107* 98   Iron Studies: No results for input(s): "IRON", "TIBC", "TRANSFERRIN", "FERRITIN" in the last 72 hours. @lablastinr3 @ Studies/Results: No results found. Medications:  ferric gluconate (FERRLECIT) IVPB 125 mg (06/21/22 1706)    sodium chloride   Intravenous Once   sodium chloride   Intravenous Once   allopurinol  200 mg Oral Daily   calcitRIOL  0.5 mcg Oral Daily   carvedilol  12.5 mg Oral BID WC   feeding supplement (NEPRO CARB STEADY)  237 mL Oral Q24H  insulin aspart  0-6 Units Subcutaneous TID WC   levothyroxine  112 mcg Oral QAC breakfast   pantoprazole  40 mg Oral BID AC   pregabalin  150 mg Oral TID   rosuvastatin  10 mg Oral Daily   sevelamer carbonate  800 mg Oral TID WC   sodium chloride flush  3 mL Intravenous Q12H     Dialysis Orders: [facility]: W,F Sunday  3 hrs 48 min Nx Stage 2.0 K 45 lactate volume per tx 60 liters Max FF: 73% BFR 425 DFR 15.8 L/Hr. Max UFR0.89 ml/kg/hr EDW 88 kg       Assessment/Plan: IB/ABLA-W/U per primary/GI. HGB 4.9 on admission, then 7.4 S/P 3 units PRBCs. Transfuse 1 more units in HD 06/21/2022. HGB 9.4 this AM. Upper Endo 06/22/2022.  2 small areas of angiectasias found, argon coag done. Went for colonoscopy in AM. Multiple polyps-removed, no obvious source of bleeding found.   ESRD -  Home HD 3 X weekly.  Will have HD MWF while here. K+ 4.0 use 2.0 K bath. No heparin. Next HD 06/24/2022 hopefully at home.   Hypertension/volume  - BP soft. On losartan mg PO BID, Carvedilol 25 mg PO daily at home per home therapy med list. . Losartan currently on hold. Carvedilol dose has been changed to 12.5 mg PO BID. No evidence of volume overload by exam. UF as tolerated. He is about 2 kgs above OP EDW.   Anemia  - As noted in #1. Follow HGB, transfuse as necessary. OP Iron profile 06/10/22 Fe 58 Tsat 22%. Continue Fe load.   Metabolic bone disease -  Continue sevelamer binders, VDRA. Add PO4 to labs.   Nutrition - Changed to renal/Carb mod diet. Albumin low. Add protein supplements.  DMT2- management per primary H/O Atrial Flutter-not on [facility] Renal Recommendations: Do not use morphine in ESRD pt. Avoid baclofen. Renal dose all antibiotics and antivirals to appropriate creatinine clearance.   Disposition: Anticipating DC home today. He will resume usual home HD schedule.   Rita H. Brown NP-C 06/23/2022, 10:29 AM  [facility] [phone]     Seen and examined independently earlier this AM.  Agree with note and exam as documented above by physician extender and as noted here.    States he is to be taken for colonoscopy shortly.  No blood per rectum but with dark stool.  Hx EGD with multiple AVM's no clear source of bleeding   General adult male in bed in no acute distress HEENT normocephalic atraumatic extraocular movements intact sclera anicteric Neck supple trachea midline Lungs clear to auscultation bilaterally normal work of breathing at rest on room air  Heart S1S2 no rub Abdomen soft nontender nondistended Extremities right foot is wrapped; no edema; left BKA Psych normal mood and affect Neuro - alert; conversant and follows commands Access:  LUE AVF with bruit and thrill    Acute GI bleed  - s/p EGD - Colonoscopy today   Acute blood loss anemia as well as chronic disease - s/p  3 units PRBC's  - need to clarify last outpatient ESA and will need ESA outpatient    ESRD  - HD per MWF schedule    HTN - controlled on current regimen    Metabolic bone disease - on sevelamer.   History of atrial flutter - not on anticoagulation    Transaminitis - per primary team    Disposition - per primary team discretion   Claudia Desanctis,  MD 06/23/2022  12:45 PM

## 2022-06-23 NOTE — Transfer of Care (Signed)
Immediate Anesthesia Transfer of Care Note  Patient: [name]  Procedure(s) Performed: COLONOSCOPY POLYPECTOMY  Patient Location: PACU  Anesthesia Type:MAC  Level of Consciousness: sedated  Airway & Oxygen Therapy: Patient Spontanous Breathing and Patient connected to nasal cannula oxygen  Post-op Assessment: Report given to RN and Post -op Vital signs reviewed and stable  Post vital signs: Reviewed and stable  Last Vitals:  Vitals Value Taken Time  BP 109/49 06/23/22 0915  Temp    Pulse    Resp 17 06/23/22 0918  SpO2    Vitals shown include unvalidated device data.  Last Pain:  Vitals:   06/23/22 0805  TempSrc: Temporal  PainSc: 0-No pain      Patients Stated Pain Goal: 0 ([id number])  Complications: No notable events documented.

## 2022-06-23 NOTE — Op Note (Addendum)
[facility] Patient Name: [name] Procedure Date : 06/23/2022 MRN: [id number] Attending MD: Estill Cotta. Loletha Carrow , MD, [id number] Date of Birth: [birth year] CSN: [id number] Age: 72 Admit Type: Inpatient Procedure:                Colonoscopy Indications:              Heme positive stool, Iron deficiency anemia                            secondary to chronic blood loss                           (EGD yesterday by Dr. Rush Landmark treated multiple                            nonbleeding AVMs with APC)                           Today's procedure to rule out a lower GI source of                            anemia. It has been many years since the patient's                            last colonoscopy. Providers:                Estill Cotta. Loletha Carrow, MD, Grace Isaac, RN, Darliss Cheney,                            Technician Referring MD:             [facility] hospitalist Medicines:                Monitored Anesthesia Care Complications:            No immediate complications. Estimated Blood Loss:     Estimated blood loss was minimal. Procedure:                Pre-Anesthesia Assessment:                           - Prior to the procedure, a History and Physical                            was performed, and patient medications and                            allergies were reviewed. The patient's tolerance of                            previous anesthesia was also reviewed. The risks                            and benefits of the procedure and the sedation                            options and  risks were discussed with the patient.                            All questions were answered, and informed consent                            was obtained. Prior Anticoagulants: The patient has                            taken no anticoagulant or antiplatelet agents. ASA                            Grade Assessment: III - A patient with severe                            systemic disease. After reviewing the risks  and                            benefits, the patient was deemed in satisfactory                            condition to undergo the procedure.                           After obtaining informed consent, the colonoscope                            was passed under direct vision. Throughout the                            procedure, the patient's blood pressure, pulse, and                            oxygen saturations were monitored continuously. The                            [id number] Olympus coloscope was                            introduced through the anus and advanced to the the                            cecum, identified by appendiceal orifice and                            ileocecal valve. The colonoscopy was performed                            without difficulty. The patient tolerated the                            procedure well. The quality of the bowel  preparation was excellent. The ileocecal valve,                            appendiceal orifice, and rectum were photographed.                            The bowel preparation used was MoviPrep via split                            dose instruction. Scope In: 8:31:26 AM Scope Out: 9:05:32 AM Scope Withdrawal Time: 0 hours 30 minutes 55 seconds  Total Procedure Duration: 0 hours 34 minutes 6 seconds  Findings:      The perianal and digital rectal examinations were normal.      Repeat examination of right colon under NBI performed.      A few diverticula were found in the left colon.      Five sessile polyps were found in the ascending colon and cecum. The       polyps were 3 to 6 mm in size. These polyps were removed with a cold       snare. Resection and retrieval were complete.      Four sessile polyps were found in the transverse colon. The polyps were       4 to 6 mm in size. These polyps were removed with a cold snare.       Resection and retrieval were complete.      Two sessile polyps  were found in the proximal descending colon. The       polyps were diminutive in size. These polyps were removed with a cold       snare. Resection and retrieval were complete.      Internal hemorrhoids were found. The hemorrhoids were small.      The exam was otherwise without abnormality on direct and retroflexion       views. Impression:               - Diverticulosis in the left colon.                           - Five 3 to 6 mm polyps in the ascending colon and                            in the cecum, removed with a cold snare. Resected                            and retrieved.                           - Four 4 to 6 mm polyps in the transverse colon,                            removed with a cold snare. Resected and retrieved.                           - Two diminutive polyps in the proximal descending  colon, removed with a cold snare. Resected and                            retrieved.                           - Internal hemorrhoids.                           - The examination was otherwise normal on direct                            and retroflexion views.                           No source of GI bleeding seen on this exam. Recommendation:           - Return patient to hospital ward for possible                            discharge same day.                           - Resume regular diet.                           - Await pathology results.                           - Repeat colonoscopy is recommended for                            surveillance. The colonoscopy date will be                            determined after pathology results from today's                            exam become available for review.                           - This patient will be notified of pathology                            results via letter after hospitalization.                           He can be discharged as soon as today from a GI                             perspective.                           Close outpatient attention to his anemia by primary                            care and nephrology providing  sufficient oral                            and/or iron IV supplementation as needed. Procedure Code(s):        --- Professional ---                           [phone], Colonoscopy, flexible; with removal of                            tumor(s), polyp(s), or other lesion(s) by snare                            technique Diagnosis Code(s):        --- Professional ---                           K64.8, Other hemorrhoids                           D12.2, Benign neoplasm of ascending colon                           D12.0, Benign neoplasm of cecum                           D12.3, Benign neoplasm of transverse colon (hepatic                            flexure or splenic flexure)                           D12.4, Benign neoplasm of descending colon                           R19.5, Other fecal abnormalities                           D50.0, Iron deficiency anemia secondary to blood                            loss (chronic)                           K57.30, Diverticulosis of large intestine without                            perforation or abscess without bleeding CPT copyright 2022 American [employer] Association. All rights reserved. The codes documented in this report are preliminary and upon coder review may  be revised to meet current compliance requirements. Juanisha Bautch L. Loletha Carrow, MD 06/23/2022 9:13:44 AM This report has been signed electronically. Number of Addenda: 0

## 2022-06-23 NOTE — Progress Notes (Signed)
AVS given and reviewed with pt. Medications discussed. Dressing supplies provided to pt. All questions answered to satisfaction. Pt verbalized understanding of information given. Pt to be escorted off the unit with all belongings via wheelchair by staff member.

## 2022-06-24 ENCOUNTER — Encounter: Payer: Self-pay | Admitting: Orthopedic Surgery

## 2022-06-24 ENCOUNTER — Ambulatory Visit (INDEPENDENT_AMBULATORY_CARE_PROVIDER_SITE_OTHER): Payer: Medicare Other | Admitting: Orthopedic Surgery

## 2022-06-24 DIAGNOSIS — L97411 Non-pressure chronic ulcer of right heel and midfoot limited to breakdown of skin: Secondary | ICD-10-CM

## 2022-06-24 NOTE — Anesthesia Postprocedure Evaluation (Signed)
Anesthesia Post Note  Patient: [name]  Procedure(s) Performed: COLONOSCOPY POLYPECTOMY     Patient location during evaluation: PACU Anesthesia Type: MAC Level of consciousness: awake and alert Pain management: pain level controlled Vital Signs Assessment: post-procedure vital signs reviewed and stable Respiratory status: spontaneous breathing, nonlabored ventilation, respiratory function stable and patient connected to nasal cannula oxygen Cardiovascular status: stable and blood pressure returned to baseline Postop Assessment: no apparent nausea or vomiting Anesthetic complications: no  No notable events documented.  Last Vitals:  Vitals:   06/23/22 0915 06/23/22 0930  BP: (!) 109/49 (!) 117/43  Pulse:  66  Resp: 16 17  Temp: 36.4 C 36.5 C  SpO2: 100% 100%    Last Pain:  Vitals:   06/23/22 0930  TempSrc:   PainSc: 0-No pain                 Yeily Link

## 2022-06-24 NOTE — Progress Notes (Signed)
Late Note Entry  Pt was d/c to home yesterday. Contacted [facility] to advise staff of pt's d/c and that pt should resume home therapies at d/c.   Melven Sartorius Renal Navigator [phone]

## 2022-06-24 NOTE — Progress Notes (Signed)
Office Visit Note   Patient: [name]           Date of Birth: 01/01/1951           MRN: [id number] Visit Date: 06/24/2022              Requested by: Lorenda Hatchet, FNP [id number] [place],  [place] [id number] PCP: Lorenda Hatchet, FNP  Chief Complaint  Patient presents with   Right Foot - Routine Post Op    05/17/2022 I&D right foot      HPI: Patient is a 72 year old gentleman who is status post irrigation and debridement right foot February 9.  Kerecis was applied in the office on March 11.  Patient states he recently has been admitted to the hospital with anemia and received transfusions.  Assessment & Plan: Visit Diagnoses:  1. Midfoot ulcer, right, limited to breakdown of skin ([place])     Plan: 4 cm of Kerecis micro graft was applied.  Possible reapply 4 cm at follow-up.  Follow-Up Instructions: Return in about 1 week (around 07/01/2022).   Ortho Exam  Patient is alert, oriented, no adenopathy, well-dressed, normal affect, normal respiratory effort. Examination the plantar wound has healthy granulation tissue this was debrided.  4 cm of Kerecis micro graft was applied.  The wound healing has stalled, the wound bed has healthy granulation tissue, and patient presents for evaluation and application of [place] Micro graft. After informed consent a 10 blade knife was used to debride the skin and soft tissue to healthy viable bleeding granulation tissue.  Silver nitrate was used for hemostasis. The wound measures: 3.5 cm in length, 2.0cm  in width, 3 mm in depth, wound location plantar aspect right foot. Kerecis MariGen micro tissue graft 4 cm2 was applied, and there was no wastage.  Please see the photo below of the Lot number and expiration date. The micro tissue graft was covered with a nonadherent Adaptic dressing, bolstered with 4 x 4 gauze and secured with a compression wrap.  The lateral wound at the base of the fifth metatarsal is  essentially healed.     Imaging: No results found.     Labs: Lab Results  Component Value Date   HGBA1C 5.0 05/17/2022   HGBA1C 5.5 05/04/2021   HGBA1C 6.3 (H) 02/02/2021   ESRSEDRATE 34 (H) 01/24/2020   ESRSEDRATE 41 (H) 01/10/2020   ESRSEDRATE 55 (H) 12/27/2019   CRP 8.8 (H) 01/24/2020   CRP 35.9 (H) 01/10/2020   CRP 77.9 (H) 12/27/2019   REPTSTATUS 05/21/2022 FINAL 05/17/2022   GRAMSTAIN  05/17/2022    ABUNDANT WBC PRESENT, PREDOMINANTLY PMN ABUNDANT GRAM POSITIVE COCCI ABUNDANT GRAM NEGATIVE RODS    CULT  05/17/2022    MODERATE PROTEUS MIRABILIS FEW ENTEROCOCCUS FAECALIS FEW STAPHYLOCOCCUS AUREUS ABUNDANT BACTEROIDES SPECIES NOT FRAGILIS BETA LACTAMASE POSITIVE Performed at [facility], [place] [address] [place], [place] [address]    [place] 05/17/2022   LABORGA ENTEROCOCCUS FAECALIS 05/17/2022   LABORGA STAPHYLOCOCCUS AUREUS 05/17/2022     Lab Results  Component Value Date   ALBUMIN 2.9 (L) 06/22/2022   ALBUMIN 3.1 (L) 06/21/2022   ALBUMIN 4.1 06/20/2022   PREALBUMIN 32.7 05/09/2021   PREALBUMIN 14.8 (L) 12/14/2018    Lab Results  Component Value Date   MG 2.4 05/14/2021   MG 1.9 05/09/2021   MG 2.0 06/28/2020   Lab Results  Component Value Date   VD25OH 42.0 11/19/2021  VD25OH 16.13 (L) 05/09/2021    Lab Results  Component Value Date   PREALBUMIN 32.7 05/09/2021   PREALBUMIN 14.8 (L) 12/14/2018      Latest Ref Rng & Units 06/23/2022    7:39 AM 06/22/2022    8:09 AM 06/21/2022   12:19 PM  CBC EXTENDED  WBC 4.0 - 10.5 K/uL 6.0  6.4  5.9   RBC 4.22 - 5.81 MIL/uL 2.99  2.97  2.34   Hemoglobin 13.0 - 17.0 g/dL 9.4  9.4  7.4   HCT 39.0 - 52.0 % 28.9  27.7  21.8   Platelets 150 - 400 K/uL 71  79  81   NEUT# 1.7 - 7.7 K/uL  5.0    Lymph# 0.7 - 4.0 K/uL  0.4       There is no height or weight on file to calculate BMI.  Orders:  No orders of the defined types were placed in this encounter.  No orders of the  defined types were placed in this encounter.    Procedures: No procedures performed  Clinical Data: No additional findings.  ROS:  All other systems negative, except as noted in the HPI. Review of Systems  Objective: Vital Signs: There were no vitals taken for this visit.  Specialty Comments:  No specialty comments available.  PMFS History: Patient Active Problem List   Diagnosis Date Noted   Anemia due to chronic blood loss 06/23/2022   Heme positive stool 06/23/2022   Benign neoplasm of cecum 06/23/2022   Benign neoplasm of transverse colon 06/23/2022   Benign neoplasm of descending colon 06/23/2022   Abscess of right foot 05/17/2022   Contraindication to anticoagulation therapy GU Bleed 03/13/2022   Paroxysmal atrial fibrillation ([place]) 08/18/2021   Anemia of chronic renal failure 08/18/2021   Thrombocytopenia ([place]) 123456   Acute metabolic encephalopathy 123456   Action induced myoclonus 08/18/2021   Left below-knee amputee ([place]) 05/11/2021   Symptomatic anemia 09/13/2020   COVID-19 virus infection 09/13/2020   Pressure injury of skin 06/29/2020   Gross hematuria 06/16/2020   Typical atrial flutter ([place]) 03/24/2020   Bilateral pleural effusion 02/24/2020   Healthcare maintenance 01/28/2020   Shortness of breath 01/28/2020   History of partial ray amputation of fourth toe of right foot ([place]) 09/08/2019   Chronic cough 06/25/2019   Cutaneous abscess of right foot    AKI (acute kidney injury) ([place]) 12/14/2018   ESRD on hemodialysis ([employer]) 12/14/2018   S/P CABG (coronary artery bypass graft) 12/11/2018   Gait abnormality 08/24/2018   Diabetic neuropathy ([place]) 02/06/2018   Cervical myelopathy ([place]) 02/06/2018   Onychomycosis 10/30/2017   Other spondylosis with radiculopathy, cervical region 01/27/2017   Midfoot ulcer, right, limited to breakdown of skin ([place]) 11/15/2016   Lateral epicondylitis, left elbow 08/12/2016   Prostate cancer ([place]) 06/19/2016    Gout 09/14/2012   Hypertension associated with diabetes ([place]) 09/14/2012   Hypothyroidism 09/14/2012   CAD (coronary artery disease) 09/14/2012   Insulin dependent type 2 diabetes mellitus ([place]) 09/14/2012   Hyperlipidemia associated with type 2 diabetes mellitus ([place])    Neuropathy (HCC)    Past Medical History:  Diagnosis Date   Acute osteomyelitis of metatarsal bone of left foot ([place])    Ambulates with cane    straight cane   Anemia    Cervical myelopathy ([place]) 02/06/2018   Chronic kidney disease    dailysis M W F- home   Community acquired pneumonia of left lower lobe of lung 08/18/2021  Complication of anesthesia    Coronary artery disease    Dehiscence of amputation stump of left lower extremity (HCC)    Diabetes mellitus without complication (HCC)    type2   Diabetic foot ulcer ([place])    Diabetic neuropathy ([place]) 02/06/2018   Dysrhythmia    Gait abnormality 08/24/2018   GERD (gastroesophageal reflux disease)     01/06/20- not current   Gout    History of blood transfusion    History of blood transfusion    History of kidney stones    passed stones   Hypercholesteremia    Hypertension    Hypothyroidism    Neuromuscular disorder (HCC)    neuropathy left leg and bilateral feet   Neuropathy    Partial nontraumatic amputation of foot, left ([place]) 02/20/2021   PONV (postoperative nausea and vomiting)    Prostate cancer (HCC)    PVD (peripheral vascular disease) ([place])    with amputations    Family History  Problem Relation Age of Onset   Diabetes Mellitus II Mother    Kidney disease Mother    Diabetes Mellitus II Father    CAD Father    Cancer Father        prostate   Kidney disease Father    Diabetes Mellitus II Brother    Kidney disease Brother    Diabetes Mellitus II Brother    Stomach cancer Brother 26   Kidney disease Brother    Colon cancer Neg Hx    Colon polyps Neg Hx    Esophageal cancer Neg Hx    Rectal cancer Neg Hx    Pancreatic cancer Neg Hx      Past Surgical History:  Procedure Laterality Date   A-FLUTTER ABLATION N/A 04/06/2020   Procedure: A-FLUTTER ABLATION;  Surgeon: [name], MD;  Location: [facility];  Service: Cardiovascular;  Laterality: N/A;   AMPUTATION Left 12/25/2013   Procedure: AMPUTATION RAY LEFT 5TH RAY;  Surgeon: [name], MD;  Location: [facility];  Service: Orthopedics;  Laterality: Left;   AMPUTATION Right 12/15/2018   Procedure: AMPUTATION OF 4TH AND 5TH TOES RIGHT FOOT;  Surgeon: [name], MD;  Location: [facility];  Service: Orthopedics;  Laterality: Right;   AMPUTATION Left 02/02/2021   Procedure: LEFT FOOT 4TH RAY AMPUTATION;  Surgeon: [name], MD;  Location: [facility];  Service: Orthopedics;  Laterality: Left;   AMPUTATION Left 05/09/2021   Procedure: LEFT BELOW KNEE AMPUTATION;  Surgeon: [name], MD;  Location: [facility];  Service: Orthopedics;  Laterality: Left;   APPLICATION OF WOUND VAC Right 12/15/2018   Procedure: APPLICATION OF WOUND VAC;  Surgeon: [name], MD;  Location: [facility];  Service: Orthopedics;  Laterality: Right;   AV FISTULA PLACEMENT Left 03/25/2019   Procedure: LEFT ARM ARTERIOVENOUS (AV) FISTULA CREATION;  Surgeon: [name], MD;  Location: [facility];  Service: Vascular;  Laterality: Left;   [place] Left 05/20/2019   Procedure: SECOND STAGE LEFT BASCILIC VEIN TRANSPOSITION;  Surgeon: [name], MD;  Location: [facility];  Service: Vascular;  Laterality: Left;   BIOPSY  06/22/2022   Procedure: BIOPSY;  Surgeon: Irving Copas., MD;  Location[facility];  Service: Gastroenterology;;   CARDIAC CATHETERIZATION  02/17/2014   CHOLECYSTECTOMY     COLONOSCOPY  2011   in [place], [place]  2008   CYSTOSCOPY N/A 06/29/2020  Procedure: CYSTOSCOPY WITH CLOT EVACUATION AND  FULGERATION;  Surgeon: [name], MD;  Location: [facility];  Service: Urology;  Laterality: N/A;   CYSTOSCOPY  WITH FULGERATION Bilateral 06/17/2020   Procedure: CYSTOSCOPY,BILATERAL RETROGRADE, CLOT EVACUATION WITH FULGERATION OF THE BLADDER;  Surgeon: Janith Lima, MD;  Location: [facility];  Service: Urology;  Laterality: Bilateral;   CYSTOSCOPY WITH FULGERATION N/A 06/28/2020   Procedure: CYSTOSCOPY WITH CLOT EVACUATION AND FULGERATION OF BLEEDERS;  Surgeon: [name], MD;  Location: [facility];  Service: Urology;  Laterality: N/A;  1 HR   ENTEROSCOPY N/A 06/22/2022   Procedure: ENTEROSCOPY;  Surgeon: Rush Landmark Telford Nab., MD;  Location: [facility];  Service: Gastroenterology;  Laterality: N/A;   HEMOSTASIS CLIP PLACEMENT  06/22/2022   Procedure: HEMOSTASIS CLIP PLACEMENT;  Surgeon: Irving Copas., MD;  Location: [facility];  Service: Gastroenterology;;   HOT HEMOSTASIS N/A 06/22/2022   Procedure: HOT HEMOSTASIS (ARGON PLASMA COAGULATION/BICAP);  Surgeon: Irving Copas., MD;  Location: [facility];  Service: Gastroenterology;  Laterality: N/A;   I & D EXTREMITY Right 12/15/2018   Procedure: DEBRIDEMENT RIGHT FOOT;  Surgeon: [name], MD;  Location: [facility];  Service: Orthopedics;  Laterality: Right;   I & D EXTREMITY Right 08/27/2019   Procedure: PARTIAL CUBOID EXCISION RIGHT FOOT;  Surgeon: [name], MD;  Location: [facility];  Service: Orthopedics;  Laterality: Right;   I & D EXTREMITY Right 01/07/2020   Procedure: RIGHT FOOT EXCISION INFECTED BONE;  Surgeon: [name], MD;  Location: [facility];  Service: Orthopedics;  Laterality: Right;   I & D EXTREMITY Right 05/17/2022   Procedure: IRRIGATION AND DEBRIDEMENT RIGHT FOOT ABSCESS;  Surgeon: [name], MD;  Location: [facility];  Service: Orthopedics;  Laterality: Right;   NECK SURGERY     novemver 2019   POLYPECTOMY  06/22/2022   Procedure: POLYPECTOMY;  Surgeon: Mansouraty, Telford Nab., MD;  Location: [facility];  Service: Gastroenterology;;   STUMP REVISION Left 06/27/2021   Procedure: REVISION LEFT BELOW KNEE AMPUTATION;   Surgeon: [name], MD;  Location: [facility];  Service: Orthopedics;  Laterality: Left;   SUBMUCOSAL LIFTING INJECTION  06/22/2022   Procedure: SUBMUCOSAL LIFTING INJECTION;  Surgeon: Rush Landmark Telford Nab., MD;  Location: [facility];  Service: Gastroenterology;;   SUBMUCOSAL TATTOO INJECTION  06/22/2022   Procedure: SUBMUCOSAL TATTOO INJECTION;  Surgeon: Irving Copas., MD;  Location: [facility];  Service: Gastroenterology;;   TRANSURETHRAL RESECTION OF PROSTATE N/A 06/28/2020   Procedure: TRANSURETHRAL RESECTION OF THE PROSTATE (TURP);  Surgeon: [name], MD;  Location: [facility];  Service: Urology;  Laterality: N/A;   WISDOM TOOTH EXTRACTION     Social History   Occupational History   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

## 2022-06-25 ENCOUNTER — Encounter: Payer: Self-pay | Admitting: Gastroenterology

## 2022-06-25 ENCOUNTER — Encounter (HOSPITAL_COMMUNITY): Payer: Self-pay | Admitting: Gastroenterology

## 2022-06-25 LAB — SURGICAL PATHOLOGY

## 2022-07-02 ENCOUNTER — Telehealth: Payer: Self-pay | Admitting: Orthopedic Surgery

## 2022-07-02 ENCOUNTER — Ambulatory Visit (INDEPENDENT_AMBULATORY_CARE_PROVIDER_SITE_OTHER): Payer: Medicare Other | Admitting: Family

## 2022-07-02 DIAGNOSIS — Z89421 Acquired absence of other right toe(s): Secondary | ICD-10-CM | POA: Diagnosis not present

## 2022-07-02 DIAGNOSIS — L02611 Cutaneous abscess of right foot: Secondary | ICD-10-CM | POA: Diagnosis not present

## 2022-07-02 DIAGNOSIS — L97411 Non-pressure chronic ulcer of right heel and midfoot limited to breakdown of skin: Secondary | ICD-10-CM | POA: Diagnosis not present

## 2022-07-02 NOTE — Telephone Encounter (Signed)
Patient's wife called. She would like for Autumn to call her. Trying to change appointment time and day. Junie Panning does not have an open slot but would like to be worked in. Her call back number is [phone]

## 2022-07-02 NOTE — Progress Notes (Signed)
Thanks Mallie Mussel.  Vaughan Basta, This patient has multiple comorbidities including current hemodialysis.  He had multiple adenomatous colon polyps on his recent inpatient colonoscopy.  He will need recall colonoscopy in 1 year (he will need office visit at that time). Thanks, Dr. Henrene Pastor

## 2022-07-03 ENCOUNTER — Encounter: Payer: Self-pay | Admitting: Gastroenterology

## 2022-07-03 ENCOUNTER — Encounter: Payer: Self-pay | Admitting: Family

## 2022-07-03 NOTE — Progress Notes (Signed)
Office Visit Note   Patient: [name]           Date of Birth: 08-02-1950           MRN: [id number] Visit Date: 07/02/2022              Requested by: Lorenda Hatchet, FNP [id number] [place],  [place] [address] PCP: Lorenda Hatchet, FNP  Chief Complaint  Patient presents with   Right Foot - Routine Post Op    05/17/2022 I&D right foot      HPI: The patient is a 72 year old gentleman who is status post irrigation debridement of the right foot on February 9 of this year.  Leroy Kennedy is also applied in the office on March 11 as well as March 18.  Assessment & Plan: Visit Diagnoses: No diagnosis found.  Plan: 4 cm Kerecis micro graft was applied to the plantar aspect of the foot as well as the lateral wound.  He will follow-up in 1 week.  Follow-Up Instructions: Return in about 1 week (around 07/09/2022).   Ortho Exam  Patient is alert, oriented, no adenopathy, well-dressed, normal affect, normal respiratory effort. On examination of the right foot the plantar aspect wound has full healthy granulation tissue with scant bloody drainage.  4 cm of Kerecis micro graft was applied.  He also has a lateral ulcer which is stable.  Healing has stalled.  The wound healing has stalled, the wound bed has healthy granulation tissue, and patient presents for evaluation and application of [place] Micro graft. After informed consent a 10 blade knife was used to debride the skin and soft tissue to healthy viable bleeding granulation tissue.  Silver nitrate was used for hemostasis. The wound measures: 3 cm in length, 2cm  in width, 1 mm in depth, wound location plantar aspect Kerecis MariGen micro tissue graft 4cm2 was applied, and there was no wastage.  Please see the photo below of the Lot number and expiration date. The micro tissue graft was covered with a nonadherent Adaptic dressing, bolstered with 4 x 4 gauze and secured with a compression wrap.   The wound  healing has stalled, the wound bed has healthy granulation tissue, and patient presents for evaluation and application of [place] Micro graft. After informed consent a 10 blade knife was used to debride the skin and soft tissue to healthy viable bleeding granulation tissue.  Silver nitrate was used for hemostasis. The wound measures: 2cm in length, 2cm  in width, 2 mm in depth, wound location lateral right foot Kerecis MariGen micro tissue graft 4 cm2 was applied, and there was no wastage.  Please see the photo below of the Lot number and expiration date. The micro tissue graft was covered with a nonadherent Adaptic dressing, bolstered with 4 x 4 gauze and secured with a compression wrap.     Imaging: No results found.     Labs: Lab Results  Component Value Date   HGBA1C 5.0 05/17/2022   HGBA1C 5.5 05/04/2021   HGBA1C 6.3 (H) 02/02/2021   ESRSEDRATE 34 (H) 01/24/2020   ESRSEDRATE 41 (H) 01/10/2020   ESRSEDRATE 55 (H) 12/27/2019   CRP 8.8 (H) 01/24/2020   CRP 35.9 (H) 01/10/2020   CRP 77.9 (H) 12/27/2019   REPTSTATUS 05/21/2022 FINAL 05/17/2022   GRAMSTAIN  05/17/2022    ABUNDANT WBC PRESENT, PREDOMINANTLY PMN ABUNDANT GRAM POSITIVE COCCI ABUNDANT GRAM NEGATIVE RODS    CULT  05/17/2022  MODERATE PROTEUS MIRABILIS FEW ENTEROCOCCUS FAECALIS FEW STAPHYLOCOCCUS AUREUS ABUNDANT BACTEROIDES SPECIES NOT FRAGILIS BETA LACTAMASE POSITIVE Performed at [facility], [place] [address], [place], [place] [address]    [place] 05/17/2022   LABORGA ENTEROCOCCUS FAECALIS 05/17/2022   LABORGA STAPHYLOCOCCUS AUREUS 05/17/2022     Lab Results  Component Value Date   ALBUMIN 2.9 (L) 06/22/2022   ALBUMIN 3.1 (L) 06/21/2022   ALBUMIN 4.1 06/20/2022   PREALBUMIN 32.7 05/09/2021   PREALBUMIN 14.8 (L) 12/14/2018    Lab Results  Component Value Date   MG 2.4 05/14/2021   MG 1.9 05/09/2021   MG 2.0 06/28/2020   Lab Results  Component Value Date    VD25OH 42.0 11/19/2021   VD25OH 16.13 (L) 05/09/2021    Lab Results  Component Value Date   PREALBUMIN 32.7 05/09/2021   PREALBUMIN 14.8 (L) 12/14/2018      Latest Ref Rng & Units 06/23/2022    7:39 AM 06/22/2022    8:09 AM 06/21/2022   12:19 PM  CBC EXTENDED  WBC 4.0 - 10.5 K/uL 6.0  6.4  5.9   RBC 4.22 - 5.81 MIL/uL 2.99  2.97  2.34   Hemoglobin 13.0 - 17.0 g/dL 9.4  9.4  7.4   HCT 39.0 - 52.0 % 28.9  27.7  21.8   Platelets 150 - 400 K/uL 71  79  81   NEUT# 1.7 - 7.7 K/uL  5.0    Lymph# 0.7 - 4.0 K/uL  0.4       There is no height or weight on file to calculate BMI.  Orders:  No orders of the defined types were placed in this encounter.  No orders of the defined types were placed in this encounter.    Procedures: No procedures performed  Clinical Data: No additional findings.  ROS:  All other systems negative, except as noted in the HPI. Review of Systems  Objective: Vital Signs: There were no vitals taken for this visit.  Specialty Comments:  No specialty comments available.  PMFS History: Patient Active Problem List   Diagnosis Date Noted   Anemia due to chronic blood loss 06/23/2022   Heme positive stool 06/23/2022   Benign neoplasm of cecum 06/23/2022   Benign neoplasm of transverse colon 06/23/2022   Benign neoplasm of descending colon 06/23/2022   Abscess of right foot 05/17/2022   Contraindication to anticoagulation therapy GU Bleed 03/13/2022   Paroxysmal atrial fibrillation ([place]) 08/18/2021   Anemia of chronic renal failure 08/18/2021   Thrombocytopenia ([place]) 123456   Acute metabolic encephalopathy 123456   Action induced myoclonus 08/18/2021   Left below-knee amputee ([place]) 05/11/2021   Symptomatic anemia 09/13/2020   COVID-19 virus infection 09/13/2020   Pressure injury of skin 06/29/2020   Gross hematuria 06/16/2020   Typical atrial flutter ([place]) 03/24/2020   Bilateral pleural effusion 02/24/2020   Healthcare maintenance  01/28/2020   Shortness of breath 01/28/2020   History of partial ray amputation of fourth toe of right foot ([place]) 09/08/2019   Chronic cough 06/25/2019   Cutaneous abscess of right foot    AKI (acute kidney injury) ([place]) 12/14/2018   ESRD on hemodialysis ([place]) 12/14/2018   S/P CABG (coronary artery bypass graft) 12/11/2018   Gait abnormality 08/24/2018   Diabetic neuropathy ([place]) 02/06/2018   Cervical myelopathy ([place]) 02/06/2018   Onychomycosis 10/30/2017   Other spondylosis with radiculopathy, cervical region 01/27/2017   Midfoot ulcer, right, limited to breakdown of skin ([place]) 11/15/2016  Lateral epicondylitis, left elbow 08/12/2016   Prostate cancer ([place]) 06/19/2016   Gout 09/14/2012   Hypertension associated with diabetes ([place]) 09/14/2012   Hypothyroidism 09/14/2012   CAD (coronary artery disease) 09/14/2012   Insulin dependent type 2 diabetes mellitus ([place]) 09/14/2012   Hyperlipidemia associated with type 2 diabetes mellitus ([name])    Neuropathy (HCC)    Past Medical History:  Diagnosis Date   Acute osteomyelitis of metatarsal bone of left foot ([place])    Ambulates with cane    straight cane   Anemia    Cervical myelopathy ([place]) 02/06/2018   Chronic kidney disease    dailysis [facility]- home   Community acquired pneumonia of left lower lobe of lung 123456   Complication of anesthesia    Coronary artery disease    Dehiscence of amputation stump of left lower extremity ([place])    Diabetes mellitus without complication ([place])    type2   Diabetic foot ulcer ([place])    Diabetic neuropathy ([place]) 02/06/2018   Dysrhythmia    Gait abnormality 08/24/2018   GERD (gastroesophageal reflux disease)     01/06/20- not current   Gout    History of blood transfusion    History of blood transfusion    History of kidney stones    passed stones   Hypercholesteremia    Hypertension    Hypothyroidism    Neuromuscular disorder (HCC)    neuropathy left leg and bilateral feet   Neuropathy     Partial nontraumatic amputation of foot, left ([place]) 02/20/2021   PONV (postoperative nausea and vomiting)    Prostate cancer (HCC)    PVD (peripheral vascular disease) ([place])    with amputations    Family History  Problem Relation Age of Onset   Diabetes Mellitus II Mother    Kidney disease Mother    Diabetes Mellitus II Father    CAD Father    Cancer Father        prostate   Kidney disease Father    Diabetes Mellitus II Brother    Kidney disease Brother    Diabetes Mellitus II Brother    Stomach cancer Brother 45   Kidney disease Brother    Colon cancer Neg Hx    Colon polyps Neg Hx    Esophageal cancer Neg Hx    Rectal cancer Neg Hx    Pancreatic cancer Neg Hx     Past Surgical History:  Procedure Laterality Date   A-FLUTTER ABLATION N/A 04/06/2020   Procedure: A-FLUTTER ABLATION;  Surgeon: [name], MD;  Location: [facility];  Service: Cardiovascular;  Laterality: N/A;   AMPUTATION Left 12/25/2013   Procedure: AMPUTATION RAY LEFT 5TH RAY;  Surgeon: [name], MD;  Location: [facility];  Service: Orthopedics;  Laterality: Left;   AMPUTATION Right 12/15/2018   Procedure: AMPUTATION OF 4TH AND 5TH TOES RIGHT FOOT;  Surgeon: [name], MD;  Location: [facility];  Service: Orthopedics;  Laterality: Right;   AMPUTATION Left 02/02/2021   Procedure: LEFT FOOT 4TH RAY AMPUTATION;  Surgeon: [name], MD;  Location: [facility];  Service: Orthopedics;  Laterality: Left;   AMPUTATION Left 05/09/2021   Procedure: LEFT BELOW KNEE AMPUTATION;  Surgeon: [name], MD;  Location: [facility];  Service: Orthopedics;  Laterality: Left;   APPLICATION OF WOUND VAC Right 12/15/2018   Procedure: APPLICATION OF WOUND VAC;  Surgeon: [name], MD;  Location: [facility];  Service: Orthopedics;  Laterality: Right;  AV FISTULA PLACEMENT Left 03/25/2019   Procedure: LEFT ARM ARTERIOVENOUS (AV) FISTULA CREATION;  Surgeon: [name], MD;  Location: [facility];  Service: Vascular;   Laterality: Left;   [place] Left 05/20/2019   Procedure: SECOND STAGE LEFT BASCILIC VEIN TRANSPOSITION;  Surgeon: [name], MD;  Location: [facility];  Service: Vascular;  Laterality: Left;   BIOPSY  06/22/2022   Procedure: BIOPSY;  Surgeon: Irving Copas., MD;  Location: [facility];  Service: Gastroenterology;;   CARDIAC CATHETERIZATION  02/17/2014   CHOLECYSTECTOMY     COLONOSCOPY  2011   in [place], Normal   COLONOSCOPY N/A 06/23/2022   Procedure: COLONOSCOPY;  Surgeon: Doran Stabler, MD;  Location: [facility];  Service: Gastroenterology;  Laterality: N/A;   CORONARY ARTERY BYPASS GRAFT  2008   CYSTOSCOPY N/A 06/29/2020   Procedure: CYSTOSCOPY WITH CLOT EVACUATION AND  FULGERATION;  Surgeon: [name], MD;  Location: [facility];  Service: Urology;  Laterality: N/A;   CYSTOSCOPY WITH FULGERATION Bilateral 06/17/2020   Procedure: CYSTOSCOPY,BILATERAL RETROGRADE, CLOT EVACUATION WITH FULGERATION OF THE BLADDER;  Surgeon: Janith Lima, MD;  Location: [facility];  Service: Urology;  Laterality: Bilateral;   CYSTOSCOPY WITH FULGERATION N/A 06/28/2020   Procedure: CYSTOSCOPY WITH CLOT EVACUATION AND FULGERATION OF BLEEDERS;  Surgeon: [name], MD;  Location: [facility];  Service: Urology;  Laterality: N/A;  1 HR   ENTEROSCOPY N/A 06/22/2022   Procedure: ENTEROSCOPY;  Surgeon: Rush Landmark Telford Nab., MD;  Location: [facility];  Service: Gastroenterology;  Laterality: N/A;   HEMOSTASIS CLIP PLACEMENT  06/22/2022   Procedure: HEMOSTASIS CLIP PLACEMENT;  Surgeon: Irving Copas., MD;  Location: [facility];  Service: Gastroenterology;;   HOT HEMOSTASIS N/A 06/22/2022   Procedure: HOT HEMOSTASIS (ARGON PLASMA COAGULATION/BICAP);  Surgeon: Irving Copas., MD;  Location: [facility] [facility];  Service: Gastroenterology;  Laterality: N/A;   I & D EXTREMITY Right 12/15/2018   Procedure: DEBRIDEMENT RIGHT FOOT;  Surgeon: [name], MD;  Location: [facility];  Service: Orthopedics;  Laterality: Right;   I & D EXTREMITY Right 08/27/2019   Procedure: PARTIAL CUBOID EXCISION RIGHT FOOT;  Surgeon: [name], MD;  Location: [facility];  Service: Orthopedics;  Laterality: Right;   I & D EXTREMITY Right 01/07/2020   Procedure: RIGHT FOOT EXCISION INFECTED BONE;  Surgeon: [name], MD;  Location: [facility];  Service: Orthopedics;  Laterality: Right;   I & D EXTREMITY Right 05/17/2022   Procedure: IRRIGATION AND DEBRIDEMENT RIGHT FOOT ABSCESS;  Surgeon: [name], MD;  Location: [facility];  Service: Orthopedics;  Laterality: Right;   NECK SURGERY     novemver 2019   POLYPECTOMY  06/22/2022   Procedure: POLYPECTOMY;  Surgeon: Mansouraty, Telford Nab., MD;  Location: [facility];  Service: Gastroenterology;;   POLYPECTOMY  06/23/2022   Procedure: POLYPECTOMY;  Surgeon: Doran Stabler, MD;  Location: [facility];  Service: Gastroenterology;;   STUMP REVISION Left 06/27/2021   Procedure: REVISION LEFT BELOW KNEE AMPUTATION;  Surgeon: [name], MD;  Location: [facility];  Service: Orthopedics;  Laterality: Left;   SUBMUCOSAL LIFTING INJECTION  06/22/2022   Procedure: SUBMUCOSAL LIFTING INJECTION;  Surgeon: Rush Landmark Telford Nab., MD;  Location: [facility];  Service: Gastroenterology;;   SUBMUCOSAL TATTOO INJECTION  06/22/2022   Procedure: SUBMUCOSAL TATTOO INJECTION;  Surgeon: Irving Copas., MD;  Location: [facility];  Service: Gastroenterology;;   TRANSURETHRAL RESECTION OF PROSTATE  N/A 06/28/2020   Procedure: TRANSURETHRAL RESECTION OF THE PROSTATE (TURP);  Surgeon: [name], MD;  Location: [facility];  Service: Urology;  Laterality: N/A;   WISDOM TOOTH EXTRACTION     Social History   Occupational History   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  Tobacco Use   Smoking status: Former    Types: Cigars    Start date: 04/08/1973    Quit date: 12/24/1988    Years since quitting: 33.5   Smokeless tobacco: Never    Tobacco comments:    Cigars and Pipe   Vaping Use   Vaping Use: Never used  Substance and Sexual Activity   Alcohol use: Never   Drug use: Never   Sexual activity: Yes    Partners: Female

## 2022-07-03 NOTE — Telephone Encounter (Signed)
Called Remo Lipps back at number provided and went straight to VM. Asked her to call back.

## 2022-07-03 NOTE — Telephone Encounter (Signed)
Remo Lipps called back and stated asking to be seen 9:30 am next Tuesday. I opened up a spot on schedule. Once this was done she changed her mind and wanted to keep the appt on 07/12/22.

## 2022-07-10 ENCOUNTER — Encounter: Payer: Medicare Other | Admitting: Family

## 2022-07-12 ENCOUNTER — Ambulatory Visit (INDEPENDENT_AMBULATORY_CARE_PROVIDER_SITE_OTHER): Payer: Medicare Other | Admitting: Family

## 2022-07-12 DIAGNOSIS — L97411 Non-pressure chronic ulcer of right heel and midfoot limited to breakdown of skin: Secondary | ICD-10-CM | POA: Diagnosis not present

## 2022-07-12 DIAGNOSIS — L02611 Cutaneous abscess of right foot: Secondary | ICD-10-CM

## 2022-07-12 DIAGNOSIS — Z89421 Acquired absence of other right toe(s): Secondary | ICD-10-CM

## 2022-07-19 ENCOUNTER — Encounter: Payer: Self-pay | Admitting: Family

## 2022-07-19 NOTE — Progress Notes (Signed)
Office Visit Note   Patient: [name]           Date of Birth: 04/02/1951           MRN: [id number] Visit Date: 07/12/2022              Requested by: Adolph Pollack, FNP [id number] [place],  [place] [address] PCP: Adolph Pollack, FNP  Chief Complaint  Patient presents with   Right Foot - Routine Post Op    05/17/2022 I&D right foot      HPI: The patient is a 72 year old gentleman seen in follow-up for ulcers to the right foot 1 to the left plantar aspect wound lateral aspect.  He is status post irrigation debridement with Kerecis placement on February 9 to the wound on the plantar aspect of his foot.  He is also had Kerecis powder applied in the office March 11 March 18 as well as March 26.  Has been minimizing his weightbearing does have a double upright brace with custom orthotic for the right foot  Assessment & Plan: Visit Diagnoses: No diagnosis found.  Plan: 4 cm Kerecis micro graft applied to the lateral foot wound.  Silver cell dressing plantar aspect.  Follow-up in 1 week.  Understand dressing changes and use of scaffolding dressing.  Follow-Up Instructions: No follow-ups on file.   Ortho Exam  Patient is alert, oriented, no adenopathy, well-dressed, normal affect, normal respiratory effort. On examination of the right foot the plantar aspect wound has full healthy granulation tissue with scant bloody drainage this continues to improve this is now 3 cm in length 1 cm in width 1 mm in depth the lateral ulcer is stable this is 2 cm in diameter filled in with 25% granulation and 25 % necrotic tissue 50% fibrinous exudative tissue there is no surrounding erythema or maceration.  Scant clear drainage  The wound healing has stalled, the wound bed has healthy granulation tissue, and patient presents for evaluation and application of Kerecis Mari[employer] Micro graft. After informed consent a 10 blade knife was used to debride the skin and soft tissue to  healthy viable bleeding granulation tissue.  Silver nitrate was used for hemostasis. The wound measures: 2 cm in length, 2cm  in width, 1 mm in depth, wound location lateral right foot Kerecis MariGen micro tissue graft 4 cm2 was applied, and there was no wastage.  Please see the photo below of the Lot number and expiration date. The micro tissue graft was covered with a nonadherent Adaptic dressing, bolstered with 4 x 4 gauze and secured with a compression wrap.     Imaging: No results found.     Labs: Lab Results  Component Value Date   HGBA1C 5.0 05/17/2022   HGBA1C 5.5 05/04/2021   HGBA1C 6.3 (H) 02/02/2021   ESRSEDRATE 34 (H) 01/24/2020   ESRSEDRATE 41 (H) 01/10/2020   ESRSEDRATE 55 (H) 12/27/2019   CRP 8.8 (H) 01/24/2020   CRP 35.9 (H) 01/10/2020   CRP 77.9 (H) 12/27/2019   REPTSTATUS 05/21/2022 FINAL 05/17/2022   GRAMSTAIN  05/17/2022    ABUNDANT WBC PRESENT, PREDOMINANTLY PMN ABUNDANT GRAM POSITIVE COCCI ABUNDANT GRAM NEGATIVE RODS    CULT  05/17/2022    MODERATE PROTEUS MIRABILIS FEW ENTEROCOCCUS FAECALIS FEW STAPHYLOCOCCUS AUREUS ABUNDANT BACTEROIDES SPECIES NOT FRAGILIS BETA LACTAMASE POSITIVE Performed at [facility], [address], [place], [place] [address]    Imelda Pillow PROTEUS MIRABILIS 05/17/2022   [facility] ENTEROCOCCUS  FAECALIS 05/17/2022   LABORGA STAPHYLOCOCCUS AUREUS 05/17/2022     Lab Results  Component Value Date   ALBUMIN 2.9 (L) 06/22/2022   ALBUMIN 3.1 (L) 06/21/2022   ALBUMIN 4.1 06/20/2022   PREALBUMIN 32.7 05/09/2021   PREALBUMIN 14.8 (L) 12/14/2018    Lab Results  Component Value Date   MG 2.4 05/14/2021   MG 1.9 05/09/2021   MG 2.0 06/28/2020   Lab Results  Component Value Date   VD25OH 42.0 11/19/2021   VD25OH 16.13 (L) 05/09/2021    Lab Results  Component Value Date   PREALBUMIN 32.7 05/09/2021   PREALBUMIN 14.8 (L) 12/14/2018      Latest Ref Rng & Units 06/23/2022    7:39 AM 06/22/2022    8:09 AM 06/21/2022    12:19 PM  CBC EXTENDED  WBC 4.0 - 10.5 K/uL 6.0  6.4  5.9   RBC 4.22 - 5.81 MIL/uL 2.99  2.97  2.34   Hemoglobin 13.0 - 17.0 g/dL 9.4  9.4  7.4   HCT 16.1 - 52.0 % 28.9  27.7  21.8   Platelets 150 - 400 K/uL 71  79  81   NEUT# 1.7 - 7.7 K/uL  5.0    Lymph# 0.7 - 4.0 K/uL  0.4       There is no height or weight on file to calculate BMI.  Orders:  No orders of the defined types were placed in this encounter.  No orders of the defined types were placed in this encounter.    Procedures: No procedures performed  Clinical Data: No additional findings.  ROS:  All other systems negative, except as noted in the HPI. Review of Systems  Objective: Vital Signs: There were no vitals taken for this visit.  Specialty Comments:  No specialty comments available.  PMFS History: Patient Active Problem List   Diagnosis Date Noted   Anemia due to chronic blood loss 06/23/2022   Heme positive stool 06/23/2022   Benign neoplasm of cecum 06/23/2022   Benign neoplasm of transverse colon 06/23/2022   Benign neoplasm of descending colon 06/23/2022   Abscess of right foot 05/17/2022   Contraindication to anticoagulation therapy GU Bleed 03/13/2022   Paroxysmal atrial fibrillation 08/18/2021   Anemia of chronic renal failure 08/18/2021   Thrombocytopenia 08/18/2021   Acute metabolic encephalopathy 08/18/2021   Action induced myoclonus 08/18/2021   Left below-knee amputee 05/11/2021   Symptomatic anemia 09/13/2020   COVID-19 virus infection 09/13/2020   Pressure injury of skin 06/29/2020   Gross hematuria 06/16/2020   Typical atrial flutter 03/24/2020   Bilateral pleural effusion 02/24/2020   Healthcare maintenance 01/28/2020   Shortness of breath 01/28/2020   History of partial ray amputation of fourth toe of right foot 09/08/2019   Chronic cough 06/25/2019   Cutaneous abscess of right foot    AKI (acute kidney injury) 12/14/2018   ESRD on hemodialysis 12/14/2018   S/P CABG  (coronary artery bypass graft) 12/11/2018   Gait abnormality 08/24/2018   Diabetic neuropathy 02/06/2018   Cervical myelopathy 02/06/2018   Onychomycosis 10/30/2017   Other spondylosis with radiculopathy, cervical region 01/27/2017   Midfoot ulcer, right, limited to breakdown of skin 11/15/2016   Lateral epicondylitis, left elbow 08/12/2016   Prostate cancer 06/19/2016   Gout 09/14/2012   Hypertension associated with diabetes 09/14/2012   Hypothyroidism 09/14/2012   CAD (coronary artery disease) 09/14/2012   Insulin dependent type 2 diabetes mellitus 09/14/2012   Hyperlipidemia associated with type 2 diabetes mellitus  Neuropathy (HCC)    Past Medical History:  Diagnosis Date   Acute osteomyelitis of metatarsal bone of left foot (HCC)    Ambulates with cane    straight cane   Anemia    Cervical myelopathy (HCC) 02/06/2018   Chronic kidney disease    dailysis [facility]- home   Community acquired pneumonia of left lower lobe of lung 08/18/2021   Complication of anesthesia    Coronary artery disease    Dehiscence of amputation stump of left lower extremity (HCC)    Diabetes mellitus without complication (HCC)    type2   Diabetic foot ulcer (HCC)    Diabetic neuropathy (HCC) 02/06/2018   Dysrhythmia    Gait abnormality 08/24/2018   GERD (gastroesophageal reflux disease)     01/06/20- not current   Gout    History of blood transfusion    History of blood transfusion    History of kidney stones    passed stones   Hypercholesteremia    Hypertension    Hypothyroidism    Neuromuscular disorder (HCC)    neuropathy left leg and bilateral feet   Neuropathy    Partial nontraumatic amputation of foot, 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   PONV (postoperative nausea and vomiting)    Prostate cancer (HCC)    PVD (peripheral vascular disease) (HCC)    with amputations    Family History  Problem Relation Age of Onset   Diabetes Mellitus II Mother    Kidney disease Mother    Diabetes Mellitus  II Father    CAD Father    Cancer Father        prostate   Kidney disease Father    Diabetes Mellitus II Brother    Kidney disease Brother    Diabetes Mellitus II Brother    Stomach cancer Brother 16   Kidney disease Brother    Colon cancer Neg Hx    Colon polyps Neg Hx    Esophageal cancer Neg Hx    Rectal cancer Neg Hx    Pancreatic cancer Neg Hx     Past Surgical History:  Procedure Laterality Date   A-FLUTTER ABLATION N/A 04/06/2020   Procedure: A-FLUTTER ABLATION;  Surgeon: Marinus Maw, MD;  Location: [facility];  Service: Cardiovascular;  Laterality: N/A;   AMPUTATION Left 12/25/2013   Procedure: AMPUTATION RAY LEFT 5TH RAY;  Surgeon: Nadara Mustard, MD;  Location: [facility] ORS;  Service: Orthopedics;  Laterality: Left;   AMPUTATION Right 12/15/2018   Procedure: AMPUTATION OF 4TH AND 5TH TOES RIGHT FOOT;  Surgeon: Nadara Mustard, MD;  Location: [facility] OR;  Service: Orthopedics;  Laterality: Right;   AMPUTATION Left 02/02/2021   Procedure: LEFT FOOT 4TH RAY AMPUTATION;  Surgeon: Nadara Mustard, MD;  Location: [facility] OR;  Service: Orthopedics;  Laterality: Left;   AMPUTATION Left 05/09/2021   Procedure: LEFT BELOW KNEE AMPUTATION;  Surgeon: Nadara Mustard, MD;  Location: [facility] OR;  Service: Orthopedics;  Laterality: Left;   APPLICATION OF WOUND VAC Right 12/15/2018   Procedure: APPLICATION OF WOUND VAC;  Surgeon: Nadara Mustard, MD;  Location: [facility] OR;  Service: Orthopedics;  Laterality: Right;   AV FISTULA PLACEMENT Left 03/25/2019   Procedure: LEFT ARM ARTERIOVENOUS (AV) FISTULA CREATION;  Surgeon: Nada Libman, MD;  Location: [facility] OR;  Service: Vascular;  Laterality: Left;   BACK SURGERY     BASCILIC VEIN TRANSPOSITION Left 05/20/2019   Procedure: SECOND STAGE LEFT BASCILIC VEIN TRANSPOSITION;  Surgeon: Myra Gianotti,  Fran Lowes, MD;  Location: [facility] OR;  Service: Vascular;  Laterality: Left;   BIOPSY  06/22/2022   Procedure: BIOPSY;  Surgeon: Lemar Lofty., MD;  Location: [facility] ENDOSCOPY;   Service: Gastroenterology;;   CARDIAC CATHETERIZATION  02/17/2014   CHOLECYSTECTOMY     COLONOSCOPY  2011   in [place], Normal   COLONOSCOPY N/A 06/23/2022   Procedure: COLONOSCOPY;  Surgeon: Sherrilyn Rist, MD;  Location: [facility] ENDOSCOPY;  Service: Gastroenterology;  Laterality: N/A;   CORONARY ARTERY BYPASS GRAFT  2008   CYSTOSCOPY N/A 06/29/2020   Procedure: CYSTOSCOPY WITH CLOT EVACUATION AND  FULGERATION;  Surgeon: Marcine Matar, MD;  Location: [facility] OR;  Service: Urology;  Laterality: N/A;   CYSTOSCOPY WITH FULGERATION Bilateral 06/17/2020   Procedure: CYSTOSCOPY,BILATERAL RETROGRADE, CLOT EVACUATION WITH FULGERATION OF THE BLADDER;  Surgeon: Jannifer Hick, MD;  Location: [facility] OR;  Service: Urology;  Laterality: Bilateral;   CYSTOSCOPY WITH FULGERATION N/A 06/28/2020   Procedure: CYSTOSCOPY WITH CLOT EVACUATION AND FULGERATION OF BLEEDERS;  Surgeon: Marcine Matar, MD;  Location: [facility] OR;  Service: Urology;  Laterality: N/A;  1 HR   ENTEROSCOPY N/A 06/22/2022   Procedure: ENTEROSCOPY;  Surgeon: Meridee Score Netty Starring., MD;  Location: [facility] ENDOSCOPY;  Service: Gastroenterology;  Laterality: N/A;   HEMOSTASIS CLIP PLACEMENT  06/22/2022   Procedure: HEMOSTASIS CLIP PLACEMENT;  Surgeon: Lemar Lofty., MD;  Location: [facility] ENDOSCOPY;  Service: Gastroenterology;;   HOT HEMOSTASIS N/A 06/22/2022   Procedure: HOT HEMOSTASIS (ARGON PLASMA COAGULATION/BICAP);  Surgeon: Lemar Lofty., MD;  Location: [facility] ENDOSCOPY;  Service: Gastroenterology;  Laterality: N/A;   I & D EXTREMITY Right 12/15/2018   Procedure: DEBRIDEMENT RIGHT FOOT;  Surgeon: Nadara Mustard, MD;  Location: [facility] OR;  Service: Orthopedics;  Laterality: Right;   I & D EXTREMITY Right 08/27/2019   Procedure: PARTIAL CUBOID EXCISION RIGHT FOOT;  Surgeon: Nadara Mustard, MD;  Location: [facility] OR;  Service: Orthopedics;  Laterality: Right;   I & D EXTREMITY Right 01/07/2020   Procedure: RIGHT FOOT EXCISION INFECTED BONE;  Surgeon:  Nadara Mustard, MD;  Location: [facility] OR;  Service: Orthopedics;  Laterality: Right;   I & D EXTREMITY Right 05/17/2022   Procedure: IRRIGATION AND DEBRIDEMENT RIGHT FOOT ABSCESS;  Surgeon: Nadara Mustard, MD;  Location: [facility] OR;  Service: Orthopedics;  Laterality: Right;   NECK SURGERY     novemver 2019   POLYPECTOMY  06/22/2022   Procedure: POLYPECTOMY;  Surgeon: Mansouraty, Netty Starring., MD;  Location: [facility] ENDOSCOPY;  Service: Gastroenterology;;   POLYPECTOMY  06/23/2022   Procedure: POLYPECTOMY;  Surgeon: Sherrilyn Rist, MD;  Location: [facility] ENDOSCOPY;  Service: Gastroenterology;;   STUMP REVISION Left 06/27/2021   Procedure: REVISION LEFT BELOW KNEE AMPUTATION;  Surgeon: Nadara Mustard, MD;  Location: [facility] OR;  Service: Orthopedics;  Laterality: Left;   SUBMUCOSAL LIFTING INJECTION  06/22/2022   Procedure: SUBMUCOSAL LIFTING INJECTION;  Surgeon: Meridee Score Netty Starring., MD;  Location: [facility] ENDOSCOPY;  Service: Gastroenterology;;   SUBMUCOSAL TATTOO INJECTION  06/22/2022   Procedure: SUBMUCOSAL TATTOO INJECTION;  Surgeon: Lemar Lofty., MD;  Location: [facility] ENDOSCOPY;  Service: Gastroenterology;;   TRANSURETHRAL RESECTION OF PROSTATE N/A 06/28/2020   Procedure: TRANSURETHRAL RESECTION OF THE PROSTATE (TURP);  Surgeon: Marcine Matar, MD;  Location: [facility] OR;  Service: Urology;  Laterality: N/A;   WISDOM TOOTH EXTRACTION     Social History   Occupational History   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

## 2022-07-22 ENCOUNTER — Emergency Department (HOSPITAL_BASED_OUTPATIENT_CLINIC_OR_DEPARTMENT_OTHER): Payer: Medicare Other

## 2022-07-22 ENCOUNTER — Encounter (HOSPITAL_BASED_OUTPATIENT_CLINIC_OR_DEPARTMENT_OTHER): Payer: Self-pay

## 2022-07-22 ENCOUNTER — Inpatient Hospital Stay (HOSPITAL_BASED_OUTPATIENT_CLINIC_OR_DEPARTMENT_OTHER)
Admission: EM | Admit: 2022-07-22 | Discharge: 2022-07-29 | DRG: 853 | Disposition: A | Discharge status: Rehabilitation Facility | Payer: Medicare Other | Attending: Internal Medicine | Admitting: Internal Medicine

## 2022-07-22 ENCOUNTER — Other Ambulatory Visit: Payer: Self-pay

## 2022-07-22 DIAGNOSIS — R188 Other ascites: Secondary | ICD-10-CM | POA: Diagnosis present

## 2022-07-22 DIAGNOSIS — D631 Anemia in chronic kidney disease: Secondary | ICD-10-CM | POA: Diagnosis present

## 2022-07-22 DIAGNOSIS — L97519 Non-pressure chronic ulcer of other part of right foot with unspecified severity: Secondary | ICD-10-CM | POA: Diagnosis present

## 2022-07-22 DIAGNOSIS — J9600 Acute respiratory failure, unspecified whether with hypoxia or hypercapnia: Secondary | ICD-10-CM

## 2022-07-22 DIAGNOSIS — I2510 Atherosclerotic heart disease of native coronary artery without angina pectoris: Secondary | ICD-10-CM | POA: Diagnosis present

## 2022-07-22 DIAGNOSIS — J9601 Acute respiratory failure with hypoxia: Principal | ICD-10-CM

## 2022-07-22 DIAGNOSIS — G9341 Metabolic encephalopathy: Secondary | ICD-10-CM | POA: Diagnosis present

## 2022-07-22 DIAGNOSIS — D696 Thrombocytopenia, unspecified: Secondary | ICD-10-CM | POA: Diagnosis present

## 2022-07-22 DIAGNOSIS — R4182 Altered mental status, unspecified: Secondary | ICD-10-CM | POA: Diagnosis not present

## 2022-07-22 DIAGNOSIS — L089 Local infection of the skin and subcutaneous tissue, unspecified: Secondary | ICD-10-CM | POA: Diagnosis present

## 2022-07-22 DIAGNOSIS — Z885 Allergy status to narcotic agent status: Secondary | ICD-10-CM

## 2022-07-22 DIAGNOSIS — Z8546 Personal history of malignant neoplasm of prostate: Secondary | ICD-10-CM

## 2022-07-22 DIAGNOSIS — E1122 Type 2 diabetes mellitus with diabetic chronic kidney disease: Secondary | ICD-10-CM | POA: Diagnosis present

## 2022-07-22 DIAGNOSIS — Z89512 Acquired absence of left leg below knee: Secondary | ICD-10-CM

## 2022-07-22 DIAGNOSIS — K219 Gastro-esophageal reflux disease without esophagitis: Secondary | ICD-10-CM | POA: Diagnosis present

## 2022-07-22 DIAGNOSIS — E8889 Other specified metabolic disorders: Secondary | ICD-10-CM | POA: Diagnosis present

## 2022-07-22 DIAGNOSIS — R627 Adult failure to thrive: Secondary | ICD-10-CM | POA: Diagnosis present

## 2022-07-22 DIAGNOSIS — E7800 Pure hypercholesterolemia, unspecified: Secondary | ICD-10-CM | POA: Diagnosis present

## 2022-07-22 DIAGNOSIS — Z9079 Acquired absence of other genital organ(s): Secondary | ICD-10-CM

## 2022-07-22 DIAGNOSIS — Z992 Dependence on renal dialysis: Secondary | ICD-10-CM

## 2022-07-22 DIAGNOSIS — Z87442 Personal history of urinary calculi: Secondary | ICD-10-CM

## 2022-07-22 DIAGNOSIS — E039 Hypothyroidism, unspecified: Secondary | ICD-10-CM | POA: Diagnosis present

## 2022-07-22 DIAGNOSIS — E1151 Type 2 diabetes mellitus with diabetic peripheral angiopathy without gangrene: Secondary | ICD-10-CM | POA: Diagnosis present

## 2022-07-22 DIAGNOSIS — A419 Sepsis, unspecified organism: Principal | ICD-10-CM

## 2022-07-22 DIAGNOSIS — Z9049 Acquired absence of other specified parts of digestive tract: Secondary | ICD-10-CM

## 2022-07-22 DIAGNOSIS — Z8249 Family history of ischemic heart disease and other diseases of the circulatory system: Secondary | ICD-10-CM

## 2022-07-22 DIAGNOSIS — M86271 Subacute osteomyelitis, right ankle and foot: Secondary | ICD-10-CM | POA: Diagnosis present

## 2022-07-22 DIAGNOSIS — R6520 Severe sepsis without septic shock: Secondary | ICD-10-CM

## 2022-07-22 DIAGNOSIS — Z79899 Other long term (current) drug therapy: Secondary | ICD-10-CM

## 2022-07-22 DIAGNOSIS — K7460 Unspecified cirrhosis of liver: Secondary | ICD-10-CM | POA: Diagnosis present

## 2022-07-22 DIAGNOSIS — Z7985 Long-term (current) use of injectable non-insulin antidiabetic drugs: Secondary | ICD-10-CM

## 2022-07-22 DIAGNOSIS — Z91018 Allergy to other foods: Secondary | ICD-10-CM

## 2022-07-22 DIAGNOSIS — I120 Hypertensive chronic kidney disease with stage 5 chronic kidney disease or end stage renal disease: Secondary | ICD-10-CM | POA: Diagnosis present

## 2022-07-22 DIAGNOSIS — Z1152 Encounter for screening for COVID-19: Secondary | ICD-10-CM

## 2022-07-22 DIAGNOSIS — Z89421 Acquired absence of other right toe(s): Secondary | ICD-10-CM

## 2022-07-22 DIAGNOSIS — L03115 Cellulitis of right lower limb: Secondary | ICD-10-CM

## 2022-07-22 DIAGNOSIS — E871 Hypo-osmolality and hyponatremia: Secondary | ICD-10-CM | POA: Diagnosis present

## 2022-07-22 DIAGNOSIS — Z841 Family history of disorders of kidney and ureter: Secondary | ICD-10-CM

## 2022-07-22 DIAGNOSIS — N2581 Secondary hyperparathyroidism of renal origin: Secondary | ICD-10-CM | POA: Diagnosis present

## 2022-07-22 DIAGNOSIS — L02611 Cutaneous abscess of right foot: Secondary | ICD-10-CM | POA: Diagnosis present

## 2022-07-22 DIAGNOSIS — N186 End stage renal disease: Secondary | ICD-10-CM

## 2022-07-22 DIAGNOSIS — Z79890 Hormone replacement therapy (postmenopausal): Secondary | ICD-10-CM

## 2022-07-22 DIAGNOSIS — E872 Acidosis, unspecified: Secondary | ICD-10-CM | POA: Diagnosis present

## 2022-07-22 DIAGNOSIS — Z951 Presence of aortocoronary bypass graft: Secondary | ICD-10-CM

## 2022-07-22 DIAGNOSIS — Z8701 Personal history of pneumonia (recurrent): Secondary | ICD-10-CM

## 2022-07-22 DIAGNOSIS — Z833 Family history of diabetes mellitus: Secondary | ICD-10-CM

## 2022-07-22 DIAGNOSIS — Z794 Long term (current) use of insulin: Secondary | ICD-10-CM

## 2022-07-22 DIAGNOSIS — M109 Gout, unspecified: Secondary | ICD-10-CM | POA: Diagnosis present

## 2022-07-22 DIAGNOSIS — Z87891 Personal history of nicotine dependence: Secondary | ICD-10-CM

## 2022-07-22 DIAGNOSIS — E1142 Type 2 diabetes mellitus with diabetic polyneuropathy: Secondary | ICD-10-CM | POA: Diagnosis present

## 2022-07-22 DIAGNOSIS — E11621 Type 2 diabetes mellitus with foot ulcer: Secondary | ICD-10-CM | POA: Diagnosis present

## 2022-07-22 LAB — CBC WITH DIFFERENTIAL/PLATELET
Abs Immature Granulocytes: 0.08 10*3/uL — ABNORMAL HIGH (ref 0.00–0.07)
Basophils Absolute: 0 10*3/uL (ref 0.0–0.1)
Basophils Relative: 0 %
Eosinophils Absolute: 0 10*3/uL (ref 0.0–0.5)
Eosinophils Relative: 0 %
HCT: 23.6 % — ABNORMAL LOW (ref 39.0–52.0)
Hemoglobin: 7.9 g/dL — ABNORMAL LOW (ref 13.0–17.0)
Immature Granulocytes: 1 %
Lymphocytes Relative: 4 %
Lymphs Abs: 0.3 10*3/uL — ABNORMAL LOW (ref 0.7–4.0)
MCH: 29.5 pg (ref 26.0–34.0)
MCHC: 33.5 g/dL (ref 30.0–36.0)
MCV: 88.1 fL (ref 80.0–100.0)
Monocytes Absolute: 0.7 10*3/uL (ref 0.1–1.0)
Monocytes Relative: 10 %
Neutro Abs: 6 10*3/uL (ref 1.7–7.7)
Neutrophils Relative %: 85 %
Platelets: 88 10*3/uL — ABNORMAL LOW (ref 150–400)
RBC: 2.68 MIL/uL — ABNORMAL LOW (ref 4.22–5.81)
RDW: 16 % — ABNORMAL HIGH (ref 11.5–15.5)
Smear Review: DECREASED
WBC: 7.1 10*3/uL (ref 4.0–10.5)
nRBC: 0 % (ref 0.0–0.2)

## 2022-07-22 LAB — COMPREHENSIVE METABOLIC PANEL
ALT: 16 U/L (ref 0–44)
AST: 27 U/L (ref 15–41)
Albumin: 3.9 g/dL (ref 3.5–5.0)
Alkaline Phosphatase: 169 U/L — ABNORMAL HIGH (ref 38–126)
Anion gap: 15 (ref 5–15)
BUN: 48 mg/dL — ABNORMAL HIGH (ref 8–23)
CO2: 28 mmol/L (ref 22–32)
Calcium: 8.7 mg/dL — ABNORMAL LOW (ref 8.9–10.3)
Chloride: 89 mmol/L — ABNORMAL LOW (ref 98–111)
Creatinine, Ser: 7.55 mg/dL — ABNORMAL HIGH (ref 0.61–1.24)
GFR, Estimated: 7 mL/min — ABNORMAL LOW (ref >60)
Glucose, Bld: 125 mg/dL — ABNORMAL HIGH (ref 70–99)
Potassium: 4.4 mmol/L (ref 3.5–5.1)
Sodium: 132 mmol/L — ABNORMAL LOW (ref 135–145)
Total Bilirubin: 1.4 mg/dL — ABNORMAL HIGH (ref 0.3–1.2)
Total Protein: 6.3 g/dL — ABNORMAL LOW (ref 6.5–8.1)

## 2022-07-22 LAB — APTT: aPTT: 48 seconds — ABNORMAL HIGH (ref 24–36)

## 2022-07-22 LAB — I-STAT ARTERIAL BLOOD GAS, ED
Acid-Base Excess: 4 mmol/L — ABNORMAL HIGH (ref 0.0–2.0)
Bicarbonate: 27.9 mmol/L (ref 20.0–28.0)
Calcium, Ion: 1.07 mmol/L — ABNORMAL LOW (ref 1.15–1.40)
HCT: 23 % — ABNORMAL LOW (ref 39.0–52.0)
Hemoglobin: 7.8 g/dL — ABNORMAL LOW (ref 13.0–17.0)
O2 Saturation: 95 %
Potassium: 3.9 mmol/L (ref 3.5–5.1)
Sodium: 128 mmol/L — ABNORMAL LOW (ref 135–145)
TCO2: 29 mmol/L (ref 22–32)
pCO2 arterial: 39.4 mmHg (ref 32–48)
pH, Arterial: 7.458 — ABNORMAL HIGH (ref 7.35–7.45)
pO2, Arterial: 73 mmHg — ABNORMAL LOW (ref 83–108)

## 2022-07-22 LAB — PROTIME-INR
INR: 1.5 — ABNORMAL HIGH (ref 0.8–1.2)
Prothrombin Time: 18.2 seconds — ABNORMAL HIGH (ref 11.4–15.2)

## 2022-07-22 LAB — I-STAT VENOUS BLOOD GAS, ED
Acid-Base Excess: 5 mmol/L — ABNORMAL HIGH (ref 0.0–2.0)
Bicarbonate: 28.9 mmol/L — ABNORMAL HIGH (ref 20.0–28.0)
Calcium, Ion: 1.05 mmol/L — ABNORMAL LOW (ref 1.15–1.40)
HCT: 23 % — ABNORMAL LOW (ref 39.0–52.0)
Hemoglobin: 7.8 g/dL — ABNORMAL LOW (ref 13.0–17.0)
O2 Saturation: 90 %
Patient temperature: 100.8
Potassium: 4.2 mmol/L (ref 3.5–5.1)
Sodium: 130 mmol/L — ABNORMAL LOW (ref 135–145)
TCO2: 30 mmol/L (ref 22–32)
pCO2, Ven: 40.2 mmHg — ABNORMAL LOW (ref 44–60)
pH, Ven: 7.469 — ABNORMAL HIGH (ref 7.25–7.43)
pO2, Ven: 59 mmHg — ABNORMAL HIGH (ref 32–45)

## 2022-07-22 LAB — RESP PANEL BY RT-PCR (RSV, FLU A&B, COVID)  RVPGX2
Influenza A by PCR: NEGATIVE
Influenza B by PCR: NEGATIVE
Resp Syncytial Virus by PCR: NEGATIVE
SARS Coronavirus 2 by RT PCR: NEGATIVE

## 2022-07-22 LAB — LIPASE, BLOOD: Lipase: 82 U/L — ABNORMAL HIGH (ref 11–51)

## 2022-07-22 LAB — LACTIC ACID, PLASMA
Lactic Acid, Venous: 0.6 mmol/L (ref 0.5–1.9)
Lactic Acid, Venous: 0.6 mmol/L (ref 0.5–1.9)

## 2022-07-22 LAB — CBG MONITORING, ED: Glucose-Capillary: 131 mg/dL — ABNORMAL HIGH (ref 70–99)

## 2022-07-22 MED ORDER — FENTANYL CITRATE PF 50 MCG/ML IJ SOSY: 50.0000 ug | PREFILLED_SYRINGE | INTRAMUSCULAR | Status: DC | PRN

## 2022-07-22 MED ORDER — ROCURONIUM BROMIDE 10 MG/ML (PF) SYRINGE
PREFILLED_SYRINGE | INTRAVENOUS | Status: AC
  Filled 2022-07-22: qty 10

## 2022-07-22 MED ORDER — SODIUM CHLORIDE 0.9 % IV SOLN: 1.0000 g | INTRAVENOUS | Status: DC

## 2022-07-22 MED ORDER — ETOMIDATE 2 MG/ML IV SOLN
INTRAVENOUS | Status: AC
  Filled 2022-07-22: qty 20

## 2022-07-22 MED ORDER — VANCOMYCIN HCL IN DEXTROSE 1-5 GM/200ML-% IV SOLN
1000.0000 mg | Freq: Once | INTRAVENOUS | Status: AC
  Administered 2022-07-22: 1000 mg via INTRAVENOUS
  Filled 2022-07-22: qty 200

## 2022-07-22 MED ORDER — METRONIDAZOLE 500 MG/100ML IV SOLN
500.0000 mg | Freq: Once | INTRAVENOUS | Status: AC
  Administered 2022-07-22: 500 mg via INTRAVENOUS
  Filled 2022-07-22: qty 100

## 2022-07-22 MED ORDER — ETOMIDATE 2 MG/ML IV SOLN: INTRAVENOUS | Status: AC | PRN

## 2022-07-22 MED ORDER — SUCCINYLCHOLINE CHLORIDE 200 MG/10ML IV SOSY
PREFILLED_SYRINGE | INTRAVENOUS | Status: AC
  Filled 2022-07-22: qty 10

## 2022-07-22 MED ORDER — SODIUM CHLORIDE 0.9 % IV SOLN
2.0000 g | Freq: Once | INTRAVENOUS | Status: AC
  Administered 2022-07-22: 2 g via INTRAVENOUS
  Filled 2022-07-22: qty 12.5

## 2022-07-22 MED ORDER — IOHEXOL 300 MG/ML  SOLN
100.0000 mL | Freq: Once | INTRAMUSCULAR | Status: AC | PRN
  Administered 2022-07-22: 80 mL via INTRAVENOUS

## 2022-07-22 MED ORDER — PROPOFOL 1000 MG/100ML IV EMUL: 0.0000 ug/kg/min | INTRAVENOUS | Status: DC

## 2022-07-22 MED ORDER — VANCOMYCIN HCL IN DEXTROSE 1-5 GM/200ML-% IV SOLN: 1000.0000 mg | Freq: Once | INTRAVENOUS | Status: DC

## 2022-07-22 MED ORDER — LACTATED RINGERS IV BOLUS (SEPSIS)
1000.0000 mL | Freq: Once | INTRAVENOUS | Status: AC
  Administered 2022-07-22: 1000 mL via INTRAVENOUS

## 2022-07-22 MED ORDER — FENTANYL CITRATE PF 50 MCG/ML IJ SOSY
50.0000 ug | PREFILLED_SYRINGE | INTRAMUSCULAR | Status: DC | PRN
  Administered 2022-07-23 (×2): 50 ug via INTRAVENOUS
  Filled 2022-07-22 (×2): qty 1

## 2022-07-22 MED ORDER — ROCURONIUM BROMIDE 50 MG/5ML IV SOLN: INTRAVENOUS | Status: AC | PRN

## 2022-07-22 MED ORDER — ACETAMINOPHEN 325 MG PO TABS
650.0000 mg | ORAL_TABLET | Freq: Once | ORAL | Status: AC
  Administered 2022-07-22: 650 mg via ORAL
  Filled 2022-07-22: qty 2

## 2022-07-22 MED ORDER — PROPOFOL 1000 MG/100ML IV EMUL
INTRAVENOUS | Status: AC
  Administered 2022-07-22: 10 ug/kg/min via INTRAVENOUS
  Filled 2022-07-22: qty 100

## 2022-07-22 NOTE — ED Notes (Signed)
Patient is alert to painful stimuli. Wife and son at bedside. Dr. Wallace Cullens at bedside speaking with pt.

## 2022-07-22 NOTE — ED Notes (Signed)
Bladder scan doe showing no urine in bladder

## 2022-07-22 NOTE — ED Notes (Signed)
Pt arrives to DB Resus room d/t concerns for decline on bipap. Pt transported by RT Florentina Addison and [employer]

## 2022-07-22 NOTE — ED Notes (Signed)
Dr. Wallace Cullens at bedside speaking with wife.

## 2022-07-22 NOTE — ED Notes (Signed)
Patient at present more alert opening eyes and responding to voice.  Attempts to follow commands with difficulty due to weakness.  Noted two wounds to right foot.  Bottom of foot about size of quarter.  Second side to bottom of foot full thickness about 1 1/2 inches wide.  Dressed with wet to dry dressing

## 2022-07-22 NOTE — ED Notes (Signed)
Pts family at bedside with edp gray

## 2022-07-22 NOTE — ED Notes (Signed)
RN and RT at bedside. Pt remains responsive to painful stimuli. Dr. Wallace Cullens notified. Wife remains at bedside.

## 2022-07-22 NOTE — ED Notes (Signed)
On present to room patient was very lethargic would not open eyes.  Hypoventilating.  Pulse ox. In 70"s then dropped to 50's with good wave form.  Resp. Paged   MD Wallace Cullens notified.

## 2022-07-22 NOTE — Sepsis Progress Note (Signed)
Code Sepsis protocol being monitored by elink 

## 2022-07-22 NOTE — Progress Notes (Signed)
   PCCM transfer request    Sending physician: Dr. Wallace Cullens  Sending facility: [facility]  Reason for transfer: Encephalopathy now intubated  Brief case summary:  72 year old brought to the ED with altered mental status, weak.  Home dialysis patient, ESRD no missed treatments.  Reportedly hypoxemic 85% on room air.  Placed on CPAP.  Labs look at baseline.  Never acidemic on blood gases.  Had further decline on mental status and was intubated.  CT head clear.  CT chest clear.  No significant findings on imaging.  Recommendations made prior to transfer: Arterial blood gas and changes made to the ventilator prior to transfer  Transfer accepted:  yes    [name] [facility] 07/22/22 11:53 PM [facility]  For contact information, see Amion. If no response to pager, please call PCCM consult pager. After hours, 7PM- 7AM, please call Elink.

## 2022-07-22 NOTE — ED Triage Notes (Signed)
Patient arrives with complaints of worsening altered mental status x2 days. Family reports patient becoming increasingly weak and incontinent.  Patient is a home dialysis patient (last treatment yesterday).   Patient is responsive to voice but needs heavy assistance to be moved. Oxygen is 85% on Room air.  RT and Dr. Wallace Cullens at bedside

## 2022-07-22 NOTE — ED Notes (Signed)
Patient desaturating to 82% at this time. RT in to increase FiO2 to 100% on Bipap. Pressure increased to 6 at this time. Patient unresponsive to sternal rub at present time. RN Eulis Foster at bedside. EDP notified and at bedside.

## 2022-07-22 NOTE — ED Notes (Signed)
Katie, RT at bedside.

## 2022-07-22 NOTE — ED Notes (Signed)
Dialysis S-T-TH

## 2022-07-22 NOTE — ED Notes (Addendum)
RN at bedside. Pt responsive to painful stimuli. Dr. Wallace Cullens notified and at bedside.

## 2022-07-22 NOTE — ED Notes (Signed)
Radiology at beside

## 2022-07-22 NOTE — ED Provider Notes (Incomplete)
[place] EMERGENCY DEPARTMENT AT [facility] Provider Note  CSN: [id number] Arrival date & time: 07/22/22 1803  Chief Complaint(s) Altered Mental Status  HPI [name] is a 72 y.o. male with past medical history as below, significant for ESRD on home dialysis 3 times weekly, last dialysis was yesterday with full session, HLD, HTN, hypothyroid, prostate cancer, CABG, chronic wound right foot, status post left-sided lower extremity amuptation secondary to osteomyelitis Dr. Lajoyce Corners to who presents to the ED with complaint of altered mental status, dyspnea, fever.  Patient coming by wife who is primary caretaker.  Patient with fatigue, confusion, difficulty breathing, fever over the past 24 hours.  Completed last dialysis session yesterday at home, completed full session.  No missed sessions over the past few weeks.  No history of prior lung disease per spouse at bedside.  No home oxygen use.  No home CPAP or BiPAP use.  Patient with slight increase to cough over past 24 hours, no sick contacts or recent travel.  Does make some urine still minimal.  No change in bowel function.  No abdominal pain or swelling reported.  Does have chronic wound to right lower extremity that appears worsened from baseline per caretaker.  Past Medical History Past Medical History:  Diagnosis Date   Acute osteomyelitis of metatarsal bone of left foot    Ambulates with cane    straight cane   Anemia    Cervical myelopathy 02/06/2018   Chronic kidney disease    dailysis M W F- home   Community acquired pneumonia of left lower lobe of lung 08/18/2021   Complication of anesthesia    Coronary artery disease    Dehiscence of amputation stump of left lower extremity    Diabetes mellitus without complication    type2   Diabetic foot ulcer    Diabetic neuropathy 02/06/2018   Dysrhythmia    Gait abnormality 08/24/2018   GERD (gastroesophageal reflux disease)     01/06/20- not current   Gout    History of  blood transfusion    History of blood transfusion    History of kidney stones    passed stones   Hypercholesteremia    Hypertension    Hypothyroidism    Neuromuscular disorder    neuropathy left leg and bilateral feet   Neuropathy    Partial nontraumatic amputation of foot, 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   PONV (postoperative nausea and vomiting)    Prostate cancer    PVD (peripheral vascular disease)    with amputations   Patient Active Problem List   Diagnosis Date Noted   Foot infection 07/22/2022   Acute respiratory failure 07/22/2022   Anemia due to chronic blood loss 06/23/2022   Heme positive stool 06/23/2022   Benign neoplasm of cecum 06/23/2022   Benign neoplasm of transverse colon 06/23/2022   Benign neoplasm of descending colon 06/23/2022   Abscess of right foot 05/17/2022   Contraindication to anticoagulation therapy GU Bleed 03/13/2022   Paroxysmal atrial fibrillation 08/18/2021   Anemia of chronic renal failure 08/18/2021   Thrombocytopenia 08/18/2021   Acute metabolic encephalopathy 08/18/2021   Action induced myoclonus 08/18/2021   Left below-knee amputee 05/11/2021   Symptomatic anemia 09/13/2020   COVID-19 virus infection 09/13/2020   Pressure injury of skin 06/29/2020   Gross hematuria 06/16/2020   Typical atrial flutter 03/24/2020   Bilateral pleural effusion 02/24/2020   Healthcare maintenance 01/28/2020   Shortness of breath 01/28/2020   History of partial ray amputation of fourth toe  of right foot 09/08/2019   Chronic cough 06/25/2019   Cutaneous abscess of right foot    AKI (acute kidney injury) 12/14/2018   ESRD on hemodialysis 12/14/2018   S/P CABG (coronary artery bypass graft) 12/11/2018   Gait abnormality 08/24/2018   Diabetic neuropathy 02/06/2018   Cervical myelopathy 02/06/2018   Onychomycosis 10/30/2017   Other spondylosis with radiculopathy, cervical region 01/27/2017   Midfoot ulcer, right, limited to breakdown of skin 11/15/2016   Lateral  epicondylitis, left elbow 08/12/2016   Prostate cancer 06/19/2016   Gout 09/14/2012   Hypertension associated with diabetes 09/14/2012   Hypothyroidism 09/14/2012   CAD (coronary artery disease) 09/14/2012   Insulin dependent type 2 diabetes mellitus 09/14/2012   Hyperlipidemia associated with type 2 diabetes mellitus    Neuropathy (HCC)    Home Medication(s) Prior to Admission medications   Medication Sig Start Date End Date Taking? Authorizing Provider  acetaminophen (TYLENOL) 500 MG tablet Take 1,000 mg by mouth every 8 (eight) hours as needed for mild pain or headache.    [provider]  allopurinol (ZYLOPRIM) 100 MG tablet Take 2 tablets (200 mg total) by mouth daily. 05/22/21   Angiulli, Mcarthur Rossetti, PA-C  carvedilol (COREG) 12.5 MG tablet TAKE 1 TABLET BY MOUTH IN THE MORNING AND AT BEDTIME. 12/17/21   Yates Decamp, MD  Darbepoetin Alfa (ARANESP) 100 MCG/0.5ML SOSY injection Inject 0.5 mLs (100 mcg total) into the vein every Thursday with hemodialysis. Patient not taking: Reported on 06/20/2022 05/24/21   Angiulli, Mcarthur Rossetti, PA-C  Insulin Pen Needle 32G X 4 MM MISC Use to inject insulin up to 4 times daily as needed. 05/22/21   Raulkar, Drema Pry, MD  levothyroxine (SYNTHROID) 112 MCG tablet Take 1 tablet (112 mcg total) by mouth daily before breakfast. 05/22/21   Angiulli, Mcarthur Rossetti, PA-C  losartan (COZAAR) 50 MG tablet Take 50 mg by mouth 2 (two) times daily.    [provider]  Methoxy PEG-Epoetin Beta (MIRCERA IJ) Inject into the skin. 09/14/21   [provider]  modafinil (PROVIGIL) 200 MG tablet Take 1 tablet (200 mg total) by mouth daily. 03/11/22   Raulkar, Drema Pry, MD  OZEMPIC, 0.25 OR 0.5 MG/DOSE, 2 MG/3ML SOPN Inject 0.5 mg into the skin once a week. 09/19/21   [provider]  pantoprazole (PROTONIX) 40 MG tablet Take 1 tablet (40 mg total) by mouth 2 (two) times daily before a meal. 06/23/22 07/23/22  Dorcas Carrow, MD  pregabalin (LYRICA) 150 MG  capsule Take 1 capsule by mouth in the morning and 2 capsules at night. Patient taking differently: Take 150 mg by mouth 3 (three) times daily. 05/22/21   Angiulli, Mcarthur Rossetti, PA-C  prochlorperazine (COMPAZINE) 5 MG tablet TAKE 1 TABLET BY MOUTH TWICE A DAY 09/06/21   Hilarie Fredrickson, MD  rosuvastatin (CRESTOR) 10 MG tablet Take 1 tablet (10 mg total) by mouth daily. 05/22/21   Angiulli, Mcarthur Rossetti, PA-C  sevelamer carbonate (RENVELA) 800 MG tablet Take 1 tablet (800 mg total) by mouth 3 (three) times daily with meals. Patient taking differently: Take 800 mg by mouth daily. 05/22/21   Angiulli, Mcarthur Rossetti, PA-C  Vitamin D, Ergocalciferol, (DRISDOL) 1.25 MG (50000 UNIT) CAPS capsule Take 1 capsule (50,000 Units total) by mouth every 7 (seven) days. 03/11/22   Raulkar, Drema Pry, MD  Past Surgical History Past Surgical History:  Procedure Laterality Date   A-FLUTTER ABLATION N/A 04/06/2020   Procedure: A-FLUTTER ABLATION;  Surgeon: Marinus Maw, MD;  Location: [facility] INVASIVE CV LAB;  Service: Cardiovascular;  Laterality: N/A;   AMPUTATION Left 12/25/2013   Procedure: AMPUTATION RAY LEFT 5TH RAY;  Surgeon: Nadara Mustard, MD;  Location: [facility] ORS;  Service: Orthopedics;  Laterality: Left;   AMPUTATION Right 12/15/2018   Procedure: AMPUTATION OF 4TH AND 5TH TOES RIGHT FOOT;  Surgeon: Nadara Mustard, MD;  Location: [facility] OR;  Service: Orthopedics;  Laterality: Right;   AMPUTATION Left 02/02/2021   Procedure: LEFT FOOT 4TH RAY AMPUTATION;  Surgeon: Nadara Mustard, MD;  Location: [facility] OR;  Service: Orthopedics;  Laterality: Left;   AMPUTATION Left 05/09/2021   Procedure: LEFT BELOW KNEE AMPUTATION;  Surgeon: Nadara Mustard, MD;  Location: [facility] OR;  Service: Orthopedics;  Laterality: Left;   APPLICATION OF WOUND VAC Right 12/15/2018   Procedure: APPLICATION OF WOUND VAC;  Surgeon: Nadara Mustard, MD;   Location: [facility] OR;  Service: Orthopedics;  Laterality: Right;   AV FISTULA PLACEMENT Left 03/25/2019   Procedure: LEFT ARM ARTERIOVENOUS (AV) FISTULA CREATION;  Surgeon: Nada Libman, MD;  Location: [facility] OR;  Service: Vascular;  Laterality: Left;   BACK SURGERY     BASCILIC VEIN TRANSPOSITION Left 05/20/2019   Procedure: SECOND STAGE LEFT BASCILIC VEIN TRANSPOSITION;  Surgeon: Nada Libman, MD;  Location: [facility] OR;  Service: Vascular;  Laterality: Left;   BIOPSY  06/22/2022   Procedure: BIOPSY;  Surgeon: Lemar Lofty., MD;  Location: [facility] ENDOSCOPY;  Service: Gastroenterology;;   CARDIAC CATHETERIZATION  02/17/2014   CHOLECYSTECTOMY     COLONOSCOPY  2011   in [place], Normal   COLONOSCOPY N/A 06/23/2022   Procedure: COLONOSCOPY;  Surgeon: Sherrilyn Rist, MD;  Location: [facility] ENDOSCOPY;  Service: Gastroenterology;  Laterality: N/A;   CORONARY ARTERY BYPASS GRAFT  2008   CYSTOSCOPY N/A 06/29/2020   Procedure: CYSTOSCOPY WITH CLOT EVACUATION AND  FULGERATION;  Surgeon: Marcine Matar, MD;  Location: [facility] OR;  Service: Urology;  Laterality: N/A;   CYSTOSCOPY WITH FULGERATION Bilateral 06/17/2020   Procedure: CYSTOSCOPY,BILATERAL RETROGRADE, CLOT EVACUATION WITH FULGERATION OF THE BLADDER;  Surgeon: Jannifer Hick, MD;  Location: [facility] OR;  Service: Urology;  Laterality: Bilateral;   CYSTOSCOPY WITH FULGERATION N/A 06/28/2020   Procedure: CYSTOSCOPY WITH CLOT EVACUATION AND FULGERATION OF BLEEDERS;  Surgeon: Marcine Matar, MD;  Location: [facility] OR;  Service: Urology;  Laterality: N/A;  1 HR   ENTEROSCOPY N/A 06/22/2022   Procedure: ENTEROSCOPY;  Surgeon: Meridee Score Netty Starring., MD;  Location: [facility] ENDOSCOPY;  Service: Gastroenterology;  Laterality: N/A;   HEMOSTASIS CLIP PLACEMENT  06/22/2022   Procedure: HEMOSTASIS CLIP PLACEMENT;  Surgeon: Lemar Lofty., MD;  Location: [facility] ENDOSCOPY;  Service: Gastroenterology;;   HOT HEMOSTASIS N/A 06/22/2022   Procedure: HOT HEMOSTASIS (ARGON  PLASMA COAGULATION/BICAP);  Surgeon: Lemar Lofty., MD;  Location: [facility] ENDOSCOPY;  Service: Gastroenterology;  Laterality: N/A;   I & D EXTREMITY Right 12/15/2018   Procedure: DEBRIDEMENT RIGHT FOOT;  Surgeon: Nadara Mustard, MD;  Location: [facility] OR;  Service: Orthopedics;  Laterality: Right;   I & D EXTREMITY Right 08/27/2019   Procedure: PARTIAL CUBOID EXCISION RIGHT FOOT;  Surgeon: Nadara Mustard, MD;  Location: [facility] OR;  Service: Orthopedics;  Laterality: Right;   I & D EXTREMITY Right 01/07/2020   Procedure: RIGHT FOOT EXCISION INFECTED BONE;  Surgeon:  Nadara Mustard, MD;  Location: [facility] OR;  Service: Orthopedics;  Laterality: Right;   I & D EXTREMITY Right 05/17/2022   Procedure: IRRIGATION AND DEBRIDEMENT RIGHT FOOT ABSCESS;  Surgeon: Nadara Mustard, MD;  Location: [facility] OR;  Service: Orthopedics;  Laterality: Right;   NECK SURGERY     novemver 2019   POLYPECTOMY  06/22/2022   Procedure: POLYPECTOMY;  Surgeon: Mansouraty, Netty Starring., MD;  Location: [facility] ENDOSCOPY;  Service: Gastroenterology;;   POLYPECTOMY  06/23/2022   Procedure: POLYPECTOMY;  Surgeon: Sherrilyn Rist, MD;  Location: [facility] ENDOSCOPY;  Service: Gastroenterology;;   STUMP REVISION Left 06/27/2021   Procedure: REVISION LEFT BELOW KNEE AMPUTATION;  Surgeon: Nadara Mustard, MD;  Location: [facility] OR;  Service: Orthopedics;  Laterality: Left;   SUBMUCOSAL LIFTING INJECTION  06/22/2022   Procedure: SUBMUCOSAL LIFTING INJECTION;  Surgeon: Meridee Score Netty Starring., MD;  Location: [facility] ENDOSCOPY;  Service: Gastroenterology;;   SUBMUCOSAL TATTOO INJECTION  06/22/2022   Procedure: SUBMUCOSAL TATTOO INJECTION;  Surgeon: Lemar Lofty., MD;  Location: [facility] ENDOSCOPY;  Service: Gastroenterology;;   TRANSURETHRAL RESECTION OF PROSTATE N/A 06/28/2020   Procedure: TRANSURETHRAL RESECTION OF THE PROSTATE (TURP);  Surgeon: Marcine Matar, MD;  Location: [facility] OR;  Service: Urology;  Laterality: N/A;   WISDOM TOOTH EXTRACTION     Family  History Family History  Problem Relation Age of Onset   Diabetes Mellitus II Mother    Kidney disease Mother    Diabetes Mellitus II Father    CAD Father    Cancer Father        prostate   Kidney disease Father    Diabetes Mellitus II Brother    Kidney disease Brother    Diabetes Mellitus II Brother    Stomach cancer Brother 57   Kidney disease Brother    Colon cancer Neg Hx    Colon polyps Neg Hx    Esophageal cancer Neg Hx    Rectal cancer Neg Hx    Pancreatic cancer Neg Hx     Social History Social History   Tobacco Use   Smoking status: Former    Types: Cigars    Start date: 04/08/1973    Quit date: 12/24/1988    Years since quitting: 33.6   Smokeless tobacco: Never   Tobacco comments:    Cigars and Pipe   Vaping Use   Vaping Use: Never used  Substance Use Topics   Alcohol use: Never   Drug use: Never   Allergies Morphine and Mushroom extract complex  Review of Systems Review of Systems  Unable to perform ROS: Acuity of condition    Physical Exam Vital Signs  I have reviewed the triage vital signs BP (!) 166/73   Pulse 79   Temp 99.7 F (37.6 C)   Resp 16   Ht 5\' 10"  (1.778 m)   Wt 90.7 kg   SpO2 100%   BMI 28.70 kg/m  Physical Exam Vitals and nursing note reviewed.  Constitutional:      General: He is in acute distress.     Appearance: He is well-developed. He is ill-appearing and diaphoretic.  HENT:     Head: Normocephalic and atraumatic.     Right Ear: External ear normal.     Left Ear: External ear normal.     Mouth/Throat:     Mouth: Mucous membranes are moist.  Eyes:     General: No scleral icterus. Cardiovascular:     Rate and Rhythm: Normal rate and regular  rhythm.     Pulses: Normal pulses.     Heart sounds: Normal heart sounds.  Pulmonary:     Effort: Respiratory distress present.     Breath sounds: Decreased breath sounds present.  Abdominal:     General: Abdomen is flat.     Palpations: Abdomen is soft.     Tenderness:  There is no abdominal tenderness. There is no guarding.  Musculoskeletal:     Right lower leg: No edema.     Left lower leg: No edema.     Comments: S/p LLE amputation   Feet:     Comments: Right foot wound is noted, malodorous, no crepitance Skin:    General: Skin is warm.     Capillary Refill: Capillary refill takes less than 2 seconds.       Neurological:     Mental Status: He is alert and oriented to person, place, and time.     GCS: GCS eye subscore is 4. GCS verbal subscore is 5. GCS motor subscore is 6.  Psychiatric:        Mood and Affect: Mood normal.        Behavior: Behavior normal.              ED Results and Treatments Labs (all labs ordered are listed, but only abnormal results are displayed) Labs Reviewed  COMPREHENSIVE METABOLIC PANEL - Abnormal; Notable for the following components:      Result Value   Sodium 132 (*)    Chloride 89 (*)    Glucose, Bld 125 (*)    BUN 48 (*)    Creatinine, Ser 7.55 (*)    Calcium 8.7 (*)    Total Protein 6.3 (*)    Alkaline Phosphatase 169 (*)    Total Bilirubin 1.4 (*)    GFR, Estimated 7 (*)    All other components within normal limits  CBC WITH DIFFERENTIAL/PLATELET - Abnormal; Notable for the following components:   RBC 2.68 (*)    Hemoglobin 7.9 (*)    HCT 23.6 (*)    RDW 16.0 (*)    Platelets 88 (*)    Lymphs Abs 0.3 (*)    Abs Immature Granulocytes 0.08 (*)    All other components within normal limits  PROTIME-INR - Abnormal; Notable for the following components:   Prothrombin Time 18.2 (*)    INR 1.5 (*)    All other components within normal limits  LIPASE, BLOOD - Abnormal; Notable for the following components:   Lipase 82 (*)    All other components within normal limits  APTT - Abnormal; Notable for the following components:   aPTT 48 (*)    All other components within normal limits  I-STAT VENOUS BLOOD GAS, ED - Abnormal; Notable for the following components:   pH, Ven 7.469 (*)    pCO2,  Ven 40.2 (*)    pO2, Ven 59 (*)    Bicarbonate 28.9 (*)    Acid-Base Excess 5.0 (*)    Sodium 130 (*)    Calcium, Ion 1.05 (*)    HCT 23.0 (*)    Hemoglobin 7.8 (*)    All other components within normal limits  CBG MONITORING, ED - Abnormal; Notable for the following components:   Glucose-Capillary 131 (*)    All other components within normal limits  I-STAT ARTERIAL BLOOD GAS, ED - Abnormal; Notable for the following components:   pH, Arterial 7.458 (*)    pO2, Arterial 73 (*)  Acid-Base Excess 4.0 (*)    Sodium 128 (*)    Calcium, Ion 1.07 (*)    HCT 23.0 (*)    Hemoglobin 7.8 (*)    All other components within normal limits  RESP PANEL BY RT-PCR (RSV, FLU A&B, COVID)  [id number]  CULTURE, BLOOD (ROUTINE X 2)  CULTURE, BLOOD (ROUTINE X 2)  LACTIC ACID, PLASMA  LACTIC ACID, PLASMA  URINALYSIS, ROUTINE W REFLEX MICROSCOPIC  I-STAT ARTERIAL BLOOD GAS, ED                                                                                                                          Radiology DG Abdomen 1 View  Result Date: 07/23/2022 CLINICAL DATA:  NG tube placement EXAM: ABDOMEN - 1 VIEW COMPARISON:  None Available. FINDINGS: The bowel gas pattern is normal. No radio-opaque calculi or other significant radiographic abnormality are seen. Enteric tube tip projects over the left upper quadrant. The enteric tube is incompletely visualized on this image. On chest radiograph performed immediately after this abdominal radiograph at 11:44 p.m. the enteric tube tip is in the stomach with side port likely near the GE junction. IMPRESSION: Enteric tube tip projects over the left upper quadrant. The enteric tube is incompletely visualized on this image. On chest radiograph performed immediately after this abdominal radiograph at 11:44 p.m. the enteric tube tip is in the stomach with side port likely near the GE junction. Electronically Signed   By: Minerva Fester M.D.   On: 07/23/2022 00:13   DG Chest  Portable 1 View  Result Date: 07/23/2022 CLINICAL DATA:  Post intubation EXAM: PORTABLE CHEST 1 VIEW COMPARISON:  Chest radiographs and CT 07/22/2022. FINDINGS: Endotracheal tube in the intrathoracic trachea approximately 6.9 cm from the carina. Subdiaphragmatic enteric tube. Sternotomy. Cervical spine fusion hardware. Low lung volumes accentuate pulmonary vascularity and cardiomediastinal silhouette. No focal consolidation, pleural effusion, or pneumothorax. No displaced rib fractures. IMPRESSION: Endotracheal tube in the intrathoracic trachea approximately 6.9 cm from the carina. Electronically Signed   By: Minerva Fester M.D.   On: 07/23/2022 00:10   CT CHEST ABDOMEN PELVIS W CONTRAST  Result Date: 07/22/2022 CLINICAL DATA:  Altered mental status, weakness, sepsis EXAM: CT CHEST, ABDOMEN, AND PELVIS WITH CONTRAST TECHNIQUE: Multidetector CT imaging of the chest, abdomen and pelvis was performed following the standard protocol during bolus administration of intravenous contrast. RADIATION DOSE REDUCTION: This exam was performed according to the departmental dose-optimization program which includes automated exposure control, adjustment of the mA and/or kV according to patient size and/or use of iterative reconstruction technique. CONTRAST:  80mL OMNIPAQUE IOHEXOL 300 MG/ML  SOLN COMPARISON:  CT abdomen/pelvis dated 06/20/2022 FINDINGS: CT CHEST FINDINGS Cardiovascular: The heart is normal in size. No pericardial effusion. No evidence of thoracic aortic aneurysm. Atherosclerotic calcifications of the aortic arch. Severe three-vessel coronary atherosclerosis. Postsurgical changes related to prior CABG. Mediastinum/Nodes: No suspicious mediastinal lymphadenopathy. Visualized thyroid is unremarkable. Lungs/Pleura: Evaluation of the lung parenchyma is constrained by  respiratory motion. Within that constraint, there are no suspicious pulmonary nodules. Very mild dependent bilateral lower lobe atelectasis. No  focal consolidation. No pleural effusion or pneumothorax. Musculoskeletal: Degenerative changes of the mid/lower thoracic spine. Cervical spine fixation hardware, incompletely visualized. Median sternotomy. Stable sclerotic lesion along the left T10 vertebral body (sagittal image 87), unchanged since 2022. While indeterminate in this clinical setting, stability favors a benign bone island. CT ABDOMEN PELVIS FINDINGS Hepatobiliary: Mildly nodular hepatic contour. Status post cholecystectomy. No intrahepatic or extrahepatic duct dilatation. Pancreas: Within normal limits. Spleen: Within normal limits. Adrenals/Urinary Tract: Stable 3.9 cm right adrenal mass with macroscopic fat, compatible with a benign myelolipoma. Nodular thickening of the left adrenal gland without discrete mass. Bilateral renal cysts, including 3.7 cm posterior right upper pole renal cyst (series 3/image 92). No hydronephrosis. Bladder is thick-walled although underdistended. Stomach/Bowel: Stomach is within normal limits. No evidence of bowel obstruction. Normal appendix (series 3/image 100). Sigmoid diverticulosis, without evidence of diverticulitis. Vascular/Lymphatic: No evidence of abdominal aortic aneurysm. Atherosclerotic calcifications of the abdominal aorta and branch vessels. No suspicious abdominopelvic lymphadenopathy. Reproductive: Prostatomegaly, with enlargement of the central gland indenting the base of the bladder, suggesting BPH. Additional fiducial markers. Other: Trace perihepatic and perisplenic ascites. Small volume pelvic ascites. This appearance is similar to the prior. Musculoskeletal: Degenerative changes of the lumbar spine. IMPRESSION: No acute cardiopulmonary abnormality. Mildly nodular hepatic contour, correlate for cirrhosis. Small volume abdominopelvic ascites, chronic. Fiducial markers in the prostate. No findings specific for metastatic disease. Additional ancillary findings as above. Electronically Signed   By:  Charline Bills M.D.   On: 07/22/2022 20:53   CT Head Wo Contrast  Result Date: 07/22/2022 CLINICAL DATA:  Altered mental status, delirium EXAM: CT HEAD WITHOUT CONTRAST TECHNIQUE: Contiguous axial images were obtained from the base of the skull through the vertex without intravenous contrast. RADIATION DOSE REDUCTION: This exam was performed according to the departmental dose-optimization program which includes automated exposure control, adjustment of the mA and/or kV according to patient size and/or use of iterative reconstruction technique. COMPARISON:  MRI brain dated 10/16/2021 FINDINGS: Brain: No evidence of acute infarction, hemorrhage, hydrocephalus, extra-axial collection or mass lesion/mass effect. Mild subcortical white matter and periventricular small vessel ischemic changes. Vascular: Intracranial atherosclerosis. Skull: Normal. Negative for fracture or focal lesion. Sinuses/Orbits: The visualized paranasal sinuses are essentially clear. The mastoid air cells are unopacified. Other: None. IMPRESSION: No acute intracranial abnormality. Mild small vessel ischemic changes. Electronically Signed   By: Charline Bills M.D.   On: 07/22/2022 20:46   DG Foot 2 Views Right  Result Date: 07/22/2022 CLINICAL DATA:  Osteomyelitis EXAM: RIGHT FOOT - 2 VIEW COMPARISON:  01/03/2020 FINDINGS: Two view radiograph of the right foot is limited by obliquity on frontal radiograph. Transmetatarsal amputation of the fourth and fifth digits is again noted. No acute fracture or dislocation. Large superior calcaneal spur. Vascular calcifications are noted. There is subcutaneous gas noted within the soft tissues lateral and plantar to the fifth tarsometatarsal joint, however, no superimposed osseous erosion is identified to suggest osteomyelitis. IMPRESSION: 1. Subcutaneous gas within the soft tissues lateral and plantar to the fifth tarsometatarsal joint, reflective of local trauma and/or infection. No superimposed  osseous erosion identified to suggest osteomyelitis. Electronically Signed   By: Helyn Numbers M.D.   On: 07/22/2022 19:25   DG Chest Portable 1 View  Result Date: 07/22/2022 CLINICAL DATA:  Sepsis EXAM: PORTABLE CHEST 1 VIEW COMPARISON:  06/20/2022 FINDINGS: Lungs are clear. No pneumothorax or pleural effusion.  Coronary artery bypass grafting has been performed. Cardiac size within normal limits. Pulmonary vascularity is normal. No acute bone abnormality. IMPRESSION: 1. No active disease. Electronically Signed   By: Helyn Numbers M.D.   On: 07/22/2022 19:22    Pertinent labs & imaging results that were available during my care of the patient were reviewed by me and considered in my medical decision making (see MDM for details).  Medications Ordered in ED Medications  ceFEPIme (MAXIPIME) 1 g in sodium chloride 0.9 % 100 mL IVPB (has no administration in time range)  succinylcholine (ANECTINE) 200 MG/10ML syringe (  Not Given 07/22/22 2321)  propofol (DIPRIVAN) 1000 MG/100ML infusion (15 mcg/kg/min  90.7 kg Intravenous Rate/Dose Change 07/22/22 2337)  fentaNYL (SUBLIMAZE) injection 50 mcg (has no administration in time range)  fentaNYL (SUBLIMAZE) injection 50 mcg (has no administration in time range)  lactated ringers bolus 1,000 mL (0 mLs Intravenous Stopped 07/22/22 2057)  ceFEPIme (MAXIPIME) 2 g in sodium chloride 0.9 % 100 mL IVPB (0 g Intravenous Stopped 07/22/22 1936)  metroNIDAZOLE (FLAGYL) IVPB 500 mg (0 mg Intravenous Stopped 07/22/22 1955)  acetaminophen (TYLENOL) tablet 650 mg (650 mg Oral Given 07/22/22 1948)  vancomycin (VANCOCIN) IVPB 1000 mg/200 mL premix (0 mg Intravenous Stopped 07/22/22 2158)    Followed by  vancomycin (VANCOCIN) IVPB 1000 mg/200 mL premix (0 mg Intravenous Stopped 07/22/22 2158)  iohexol (OMNIPAQUE) 300 MG/ML solution 100 mL (80 mLs Intravenous Contrast Given 07/22/22 2028)  etomidate (AMIDATE) 2 MG/ML injection (  Given 07/22/22 2321)  rocuronium bromide 100  MG/10ML SOSY (  Given 07/22/22 2322)  etomidate (AMIDATE) injection (20 mg Intravenous Not Given 07/22/22 2321)  rocuronium (ZEMURON) injection (100 mg Intravenous Not Given 07/22/22 2322)                                                                                                                                     Procedures .Critical Care  Performed by: Sloan Leiter, DO Authorized by: Sloan Leiter, DO   Critical care provider statement:    Critical care time (minutes):  76   Critical care time was exclusive of:  Separately billable procedures and treating other patients   Critical care was necessary to treat or prevent imminent or life-threatening deterioration of the following conditions:  Sepsis and respiratory failure   Critical care was time spent personally by me on the following activities:  Development of treatment plan with patient or surrogate, discussions with consultants, evaluation of patient's response to treatment, examination of patient, ordering and review of laboratory studies, ordering and review of radiographic studies, ordering and performing treatments and interventions, pulse oximetry, re-evaluation of patient's condition, review of old charts and obtaining history from patient or surrogate   Care discussed with: admitting provider   Procedure Name: Intubation Date/Time: 07/23/2022 12:16 AM  Performed by: Sloan Leiter, DOPre-anesthesia Checklist: Patient identified, Patient being monitored, Emergency Drugs available,  Timeout performed and Suction available Oxygen Delivery Method: Non-rebreather mask Preoxygenation: Pre-oxygenation with 100% oxygen Induction Type: Rapid sequence Ventilation: Mask ventilation without difficulty Laryngoscope Size: Glidescope Tube size: 8.0 mm Number of attempts: 1 Placement Confirmation: ETT inserted through vocal cords under direct vision, CO2 detector and Breath sounds checked- equal and bilateral Secured at: 23 cm Tube  secured with: ETT holder Dental Injury: Teeth and Oropharynx as per pre-operative assessment  Difficulty Due To: Difficulty was unanticipated Comments: Copious secretions to posterior oropharynx      (including critical care time)  Medical Decision Making / ED Course    Medical Decision Making:    [name] is a 72 y.o. male  with past medical history as below, significant for ESRD on home dialysis 3 times weekly, last dialysis was yesterday with full session, HLD, HTN, hypothyroid, prostate cancer, CABG, chronic wound right foot, status post left-sided lower extremity amuptation secondary to osteomyelitis Dr. Lajoyce Corners to who presents to the ED with complaint of altered mental status, dyspnea, fever.. The complaint involves an extensive differential diagnosis and also carries with it a high risk of complications and morbidity.  Serious etiology was considered. Ddx includes but is not limited to: Differential diagnosis for adult fever includes but is not exclusive to community-acquired pneumonia, urinary tract infection, acute cholecystitis, viral syndrome, cellulitis, tick bourne disease,  decubitus ulcer, necrotizing fasciitis, meningitis, encephalitis, influenza, etc.   Complete initial physical exam performed, notably the patient  was hypoxic, respiratory distress, febrile, conversational dyspnea..    Reviewed and confirmed nursing documentation for past medical history, family history, social history.  Vital signs reviewed.    Clinical Course as of 07/23/22 0018  Mon Jul 22, 2022  2222 D/w Dr Loney Loh, accepts pt for admit  [SG]  2238 Spoke with nephro, will see for HD [SG]  2329 Pt mental status deteriorating, respiratory status worsened, proceed with emergent intubation with family consent at bedside (spouse) [SG]  2342 D/w Dr Judeth Horn, accepted for admit [facility] [SG]    Clinical Course User Index [SG] Tanda Rockers A, DO   Patient respiratory distress on arrival pulse ox 60% on  ambient air, initially placed on nonrebreather with improvement.  Placed on CPAP with improvement.  Right foot wound malodorous, concern for infection.  Will get x-ray foot.  He meets criteria for sepsis, code sepsis was paged, suspect respiratory versus cellulitis.  Possibly combination.  Sepsis bundle ordered.  Broad-spectrum antibiotics with Vanco cefepime Flagyl  Labs stable, similar to his baseline   Patient was hypoxic on arrival and respiratory distress.  Initially had improvement with nonrebreather and then CPAP.  Mental status decline, resp status declined.  Workup is overall reassuring in terms of labs/imaging.  Pulmonary imaging stable.  Unclear etiology of his respiratory abnormalities. Pt with precipitous decline in mental status and respiratory status. Responding only to noxious stimulus/sternal rub; was previously easily arousable to voice. Pt unable to hold head up, slumped over in stretcher. Pt requiring near constant painful stimulus to stay awake. D/w pt and spouse at length at bedside in regards to respiratory status and concerning decline. Pt/family agreeable to intubation at this time.    Post intubation CXR reviewed and stable, post intubation abg sent, stable.  Patient intubated emergently.  Admitted to ICU [facility]. D/w Dr Judeth Horn at [facility].  Discussed with nephrology in regards to dialysis tomorrow.     Additional history obtained: -Additional history obtained from family -External records from outside source obtained and reviewed including: Chart review  including previous notes, labs, imaging, consultation notes including primary care documentation, prior ED visits, prior labs and imaging, prior admission, home medications   Lab Tests: -I ordered, reviewed, and interpreted labs.   The pertinent results include:   Labs Reviewed  COMPREHENSIVE METABOLIC PANEL - Abnormal; Notable for the following components:      Result Value   Sodium 132 (*)    Chloride 89 (*)     Glucose, Bld 125 (*)    BUN 48 (*)    Creatinine, Ser 7.55 (*)    Calcium 8.7 (*)    Total Protein 6.3 (*)    Alkaline Phosphatase 169 (*)    Total Bilirubin 1.4 (*)    GFR, Estimated 7 (*)    All other components within normal limits  CBC WITH DIFFERENTIAL/PLATELET - Abnormal; Notable for the following components:   RBC 2.68 (*)    Hemoglobin 7.9 (*)    HCT 23.6 (*)    RDW 16.0 (*)    Platelets 88 (*)    Lymphs Abs 0.3 (*)    Abs Immature Granulocytes 0.08 (*)    All other components within normal limits  PROTIME-INR - Abnormal; Notable for the following components:   Prothrombin Time 18.2 (*)    INR 1.5 (*)    All other components within normal limits  LIPASE, BLOOD - Abnormal; Notable for the following components:   Lipase 82 (*)    All other components within normal limits  APTT - Abnormal; Notable for the following components:   aPTT 48 (*)    All other components within normal limits  I-STAT VENOUS BLOOD GAS, ED - Abnormal; Notable for the following components:   pH, Ven 7.469 (*)    pCO2, Ven 40.2 (*)    pO2, Ven 59 (*)    Bicarbonate 28.9 (*)    Acid-Base Excess 5.0 (*)    Sodium 130 (*)    Calcium, Ion 1.05 (*)    HCT 23.0 (*)    Hemoglobin 7.8 (*)    All other components within normal limits  CBG MONITORING, ED - Abnormal; Notable for the following components:   Glucose-Capillary 131 (*)    All other components within normal limits  I-STAT ARTERIAL BLOOD GAS, ED - Abnormal; Notable for the following components:   pH, Arterial 7.458 (*)    pO2, Arterial 73 (*)    Acid-Base Excess 4.0 (*)    Sodium 128 (*)    Calcium, Ion 1.07 (*)    HCT 23.0 (*)    Hemoglobin 7.8 (*)    All other components within normal limits  RESP PANEL BY RT-PCR (RSV, FLU A&B, COVID)  RVPGX2  CULTURE, BLOOD (ROUTINE X 2)  CULTURE, BLOOD (ROUTINE X 2)  LACTIC ACID, PLASMA  LACTIC ACID, PLASMA  URINALYSIS, ROUTINE W REFLEX MICROSCOPIC  I-STAT ARTERIAL BLOOD GAS, ED    Notable for  as above  EKG   EKG Interpretation  Date/Time:  Monday July 22 2022 18:51:36 EDT Ventricular Rate:  91 PR Interval:  245 QRS Duration: 98 QT Interval:  375 QTC Calculation: 462 R Axis:   88 Text Interpretation: Sinus rhythm Prolonged PR interval Borderline right axis deviation Confirmed by Tanda Rockers ([id number]) on 07/23/2022 12:16:22 AM         Imaging Studies ordered: I ordered imaging studies including CTH CT chest/abd/pelvis, foot xr I independently visualized the following imaging with scope of interpretation limited to determining acute life threatening conditions related to emergency care; findings  noted above, significant for stable imaging, cellulitis to foot  I independently visualized and interpreted imaging. I agree with the radiologist interpretation   Medicines ordered and prescription drug management: Meds ordered this encounter  Medications   lactated ringers bolus 1,000 mL    Order Specific Question:   Reason 30 mL/kg dose is not being ordered    Answer:   First Lactic Acid Pending   ceFEPIme (MAXIPIME) 2 g in sodium chloride 0.9 % 100 mL IVPB    Order Specific Question:   Antibiotic Indication:    Answer:   Other Indication (list below)    Order Specific Question:   Other Indication:    Answer:   Unknown source   metroNIDAZOLE (FLAGYL) IVPB 500 mg    Order Specific Question:   Antibiotic Indication:    Answer:   Other Indication (list below)    Order Specific Question:   Other Indication:    Answer:   Unknown source   DISCONTD: vancomycin (VANCOCIN) IVPB 1000 mg/200 mL premix    Order Specific Question:   Indication:    Answer:   Other Indication (list below)    Order Specific Question:   Other Indication:    Answer:   Unknown source   acetaminophen (TYLENOL) tablet 650 mg   FOLLOWED BY Linked Order Group    vancomycin (VANCOCIN) IVPB 1000 mg/200 mL premix     Order Specific Question:   Indication:     Answer:   Sepsis    vancomycin (VANCOCIN) IVPB  1000 mg/200 mL premix     Order Specific Question:   Indication:     Answer:   Sepsis   iohexol (OMNIPAQUE) 300 MG/ML solution 100 mL   ceFEPIme (MAXIPIME) 1 g in sodium chloride 0.9 % 100 mL IVPB    Order Specific Question:   Antibiotic Indication:    Answer:   Sepsis   etomidate (AMIDATE) 2 MG/ML injection    Jeri Modena, Esbeyd: cabinet override   succinylcholine (ANECTINE) 200 MG/10ML syringe    Jeri Modena, Esbeyd: cabinet override   rocuronium bromide 100 MG/10ML SOSY    [employer], Esbeyd: cabinet override   propofol (DIPRIVAN) 1000 MG/100ML infusion    Kays, Chloe C: cabinet override   etomidate (AMIDATE) injection   rocuronium (ZEMURON) injection   propofol (DIPRIVAN) 1000 MG/100ML infusion   fentaNYL (SUBLIMAZE) injection 50 mcg   fentaNYL (SUBLIMAZE) injection 50 mcg    -I have reviewed the patients home medicines and have made adjustments as needed   Consultations Obtained: I requested consultation with the pulm/nephro,  and discussed lab and imaging findings as well as pertinent plan   Cardiac Monitoring: The patient was maintained on a cardiac monitor.  I personally viewed and interpreted the cardiac monitored which showed an underlying rhythm of: NSR  Social Determinants of Health:  Diagnosis or treatment significantly limited by social determinants of health: former smoker   Reevaluation: After the interventions noted above, I reevaluated the patient and found that they have improved  Co morbidities that complicate the patient evaluation  Past Medical History:  Diagnosis Date   Acute osteomyelitis of metatarsal bone of left foot    Ambulates with cane    straight cane   Anemia    Cervical myelopathy 02/06/2018   Chronic kidney disease    dailysis [facility]- home   Community acquired pneumonia of left lower lobe of lung 08/18/2021   Complication of anesthesia    Coronary artery disease  Dehiscence of amputation stump of left lower extremity     Diabetes mellitus without complication    type2   Diabetic foot ulcer    Diabetic neuropathy 02/06/2018   Dysrhythmia    Gait abnormality 08/24/2018   GERD (gastroesophageal reflux disease)     01/06/20- not current   Gout    History of blood transfusion    History of blood transfusion    History of kidney stones    passed stones   Hypercholesteremia    Hypertension    Hypothyroidism    Neuromuscular disorder    neuropathy left leg and bilateral feet   Neuropathy    Partial nontraumatic amputation of foot, 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    Final Clinical Impression(s) / ED Diagnoses Final diagnoses:  Acute respiratory failure with hypoxia  ESRD on dialysis  Severe sepsis  Cellulitis of right foot     This chart was dictated using voice recognition software.  Despite best efforts to proofread,  errors can occur which can change the documentation meaning.    Sloan Leiter, DO 07/23/22 0018    Sloan Leiter, DO 07/23/22 [phone]

## 2022-07-22 NOTE — ED Notes (Signed)
Code Documentation Ended. EDP Wallace Cullens discussing POC with family

## 2022-07-22 NOTE — Plan of Care (Addendum)
72 year old male with a past medical history of paroxysmal atrial fibrillation/flutter status post ablation, thrombocytopenia, chronic anemia, left BKA, right great toe amputation, CAD status post CABG, prostate cancer, diabetes, hypothyroidism, hypertension, hyperlipidemia, gout, ESRD on HD MWF.  Admitted last month for symptomatic anemia due to suspected intermittent GI bleeding/melena.  Hemoglobin was 4.9 on presentation and patient received 3 units PRBCs after which hemoglobin improved to > 9.0.  EGD revealed multiple AVMs, duodenal polyp.  Treated with cauterization and coagulation.  No clear source of bleeding.  Colonoscopy with multiple polyps and no clear source of bleeding.  Patient presented to the ED today for evaluation of fatigue, confusion, shortness of breath, and fever over the past 24 hours.  Patient very lethargic on arrival with oxygen saturation in the 50s on room air, initially placed on NRB, then BiPAP.  Temperature 100.8 F.  Labs showing no leukocytosis, hemoglobin 7.9, platelet count 88k, sodium 132, BUN 48, creatinine 7.5, lipase 82, transaminases normal, alk phos 169, T. bili 1.4, lactic acid normal x 2, COVID/influenza/RSV PCR negative, ABG showing pH 7.45, pCO2 39.4, and pO2 73. Noted to have R foot wound/ cellulitis on exam.   X-ray of right foot: "IMPRESSION: 1. Subcutaneous gas within the soft tissues lateral and plantar to the fifth tarsometatarsal joint, reflective of local trauma and/or infection. No superimposed osseous erosion identified to suggest osteomyelitis."  CT head: "IMPRESSION: No acute intracranial abnormality.   Mild small vessel ischemic changes."  CT chest/abdomen/pelvis with contrast: "IMPRESSION: No acute cardiopulmonary abnormality.   Mildly nodular hepatic contour, correlate for cirrhosis. Small volume abdominopelvic ascites, chronic.   Fiducial markers in the prostate. No findings specific for metastatic disease.   Additional ancillary  findings as above."  EKG without acute ischemic changes.  Patient received Tylenol, vancomycin, cefepime, metronidazole, and 1 L IV fluid bolus in the ED. Per ED physician, patient is somnolent but easily arousable and stable on BiPAP.  [facility] will assume care on arrival to accepting facility. Until arrival, care as per EDP. However, [facility] available 24/7 for questions and assistance.  Nursing staff, please page [facility] Admits and Consults [phone]) as soon as the patient arrives to the hospital.    Addendum 07/22/2022 at 11 PM: Informed by ED physician that patient's clinical status has declined and he is now getting intubated and will be admitted to the ICU.  I have canceled bed request.  Critical care to be consulted by ED physician.

## 2022-07-22 NOTE — Progress Notes (Signed)
Pharmacy Antibiotic Note  [name] is a 72 y.o. male for which pharmacy has been consulted for cefepime and vancomycin dosing for sepsis.  Patient with a history of ESRD on HD (last session 4/14 -- full) HLD, HTN, hypothyroidism, prostate cancer, CABG, chronic rt foot wound s/p amputation 2/2 osteomyelitis.. Patient presenting with AMS and fever.  WBC 7.1; LA 0.6; T 100.1; HR 82; RR 22 COVID neg / flu neg  Plan: Vancomycin 2000mg  given once in ED -- repeat dosing per HD schedule Metronidazole per MD Cefepime 1g q24hr Trend WBC, Fever, Renal function F/u cultures, clinical course, WBC De-escalate when able F/u Nephrology plan     Temp (24hrs), Avg:100.8 F (38.2 C), Min:100.8 F (38.2 C), Max:100.8 F (38.2 C)  No results for input(s): "WBC", "CREATININE", "LATICACIDVEN", "VANCOTROUGH", "VANCOPEAK", "VANCORANDOM", "GENTTROUGH", "GENTPEAK", "GENTRANDOM", "TOBRATROUGH", "TOBRAPEAK", "TOBRARND", "AMIKACINPEAK", "AMIKACINTROU", "AMIKACIN" in the last 168 hours.  CrCl cannot be calculated (Patient's most recent lab result is older than the maximum 21 days allowed.).    Allergies  Allergen Reactions   Morphine     Other reaction(s): Delusions (intolerance)   Mushroom Extract Complex Nausea Only   Microbiology results: Pending  Thank you for allowing pharmacy to be a part of this patient's care.  Delmar Landau, PharmD, BCPS 07/22/2022 6:42 PM ED Clinical Pharmacist -  [phone]

## 2022-07-22 NOTE — ED Notes (Signed)
Patient transferred to [facility] due to decline on Bipap. Orders for intubation.

## 2022-07-22 NOTE — ED Notes (Signed)
Patient transported to and from CT on CPAP with this RT and RN present. Patient on cardiac monitor throughout transport. Patient tolerated well.

## 2022-07-23 DIAGNOSIS — M79604 Pain in right leg: Secondary | ICD-10-CM | POA: Diagnosis not present

## 2022-07-23 DIAGNOSIS — R188 Other ascites: Secondary | ICD-10-CM | POA: Diagnosis present

## 2022-07-23 DIAGNOSIS — E1141 Type 2 diabetes mellitus with diabetic mononeuropathy: Secondary | ICD-10-CM | POA: Diagnosis present

## 2022-07-23 DIAGNOSIS — M86271 Subacute osteomyelitis, right ankle and foot: Secondary | ICD-10-CM | POA: Diagnosis not present

## 2022-07-23 DIAGNOSIS — Z79890 Hormone replacement therapy (postmenopausal): Secondary | ICD-10-CM | POA: Diagnosis not present

## 2022-07-23 DIAGNOSIS — L089 Local infection of the skin and subcutaneous tissue, unspecified: Secondary | ICD-10-CM | POA: Diagnosis not present

## 2022-07-23 DIAGNOSIS — E039 Hypothyroidism, unspecified: Secondary | ICD-10-CM | POA: Diagnosis present

## 2022-07-23 DIAGNOSIS — L03115 Cellulitis of right lower limb: Secondary | ICD-10-CM

## 2022-07-23 DIAGNOSIS — E1122 Type 2 diabetes mellitus with diabetic chronic kidney disease: Secondary | ICD-10-CM | POA: Diagnosis not present

## 2022-07-23 DIAGNOSIS — E871 Hypo-osmolality and hyponatremia: Secondary | ICD-10-CM | POA: Diagnosis not present

## 2022-07-23 DIAGNOSIS — J9601 Acute respiratory failure with hypoxia: Secondary | ICD-10-CM

## 2022-07-23 DIAGNOSIS — E7800 Pure hypercholesterolemia, unspecified: Secondary | ICD-10-CM | POA: Diagnosis present

## 2022-07-23 DIAGNOSIS — E8889 Other specified metabolic disorders: Secondary | ICD-10-CM | POA: Diagnosis present

## 2022-07-23 DIAGNOSIS — D696 Thrombocytopenia, unspecified: Secondary | ICD-10-CM | POA: Diagnosis present

## 2022-07-23 DIAGNOSIS — J9600 Acute respiratory failure, unspecified whether with hypoxia or hypercapnia: Secondary | ICD-10-CM | POA: Diagnosis not present

## 2022-07-23 DIAGNOSIS — E872 Acidosis, unspecified: Secondary | ICD-10-CM | POA: Diagnosis present

## 2022-07-23 DIAGNOSIS — N186 End stage renal disease: Secondary | ICD-10-CM | POA: Diagnosis present

## 2022-07-23 DIAGNOSIS — G9341 Metabolic encephalopathy: Secondary | ICD-10-CM | POA: Diagnosis present

## 2022-07-23 DIAGNOSIS — Z8249 Family history of ischemic heart disease and other diseases of the circulatory system: Secondary | ICD-10-CM | POA: Diagnosis not present

## 2022-07-23 DIAGNOSIS — I120 Hypertensive chronic kidney disease with stage 5 chronic kidney disease or end stage renal disease: Secondary | ICD-10-CM | POA: Diagnosis present

## 2022-07-23 DIAGNOSIS — I2510 Atherosclerotic heart disease of native coronary artery without angina pectoris: Secondary | ICD-10-CM | POA: Diagnosis not present

## 2022-07-23 DIAGNOSIS — Z89511 Acquired absence of right leg below knee: Secondary | ICD-10-CM | POA: Diagnosis not present

## 2022-07-23 DIAGNOSIS — G546 Phantom limb syndrome with pain: Secondary | ICD-10-CM | POA: Diagnosis present

## 2022-07-23 DIAGNOSIS — D649 Anemia, unspecified: Secondary | ICD-10-CM | POA: Diagnosis present

## 2022-07-23 DIAGNOSIS — R6520 Severe sepsis without septic shock: Secondary | ICD-10-CM

## 2022-07-23 DIAGNOSIS — Z992 Dependence on renal dialysis: Secondary | ICD-10-CM | POA: Diagnosis not present

## 2022-07-23 DIAGNOSIS — Z87891 Personal history of nicotine dependence: Secondary | ICD-10-CM | POA: Diagnosis not present

## 2022-07-23 DIAGNOSIS — Z1152 Encounter for screening for COVID-19: Secondary | ICD-10-CM | POA: Diagnosis not present

## 2022-07-23 DIAGNOSIS — Z951 Presence of aortocoronary bypass graft: Secondary | ICD-10-CM | POA: Diagnosis not present

## 2022-07-23 DIAGNOSIS — N2581 Secondary hyperparathyroidism of renal origin: Secondary | ICD-10-CM | POA: Diagnosis present

## 2022-07-23 DIAGNOSIS — S88112A Complete traumatic amputation at level between knee and ankle, left lower leg, initial encounter: Secondary | ICD-10-CM | POA: Diagnosis not present

## 2022-07-23 DIAGNOSIS — Z4781 Encounter for orthopedic aftercare following surgical amputation: Secondary | ICD-10-CM | POA: Diagnosis present

## 2022-07-23 DIAGNOSIS — E875 Hyperkalemia: Secondary | ICD-10-CM | POA: Diagnosis not present

## 2022-07-23 DIAGNOSIS — R4182 Altered mental status, unspecified: Secondary | ICD-10-CM | POA: Diagnosis present

## 2022-07-23 DIAGNOSIS — A419 Sepsis, unspecified organism: Secondary | ICD-10-CM

## 2022-07-23 DIAGNOSIS — Z841 Family history of disorders of kidney and ureter: Secondary | ICD-10-CM | POA: Diagnosis not present

## 2022-07-23 DIAGNOSIS — Z89512 Acquired absence of left leg below knee: Secondary | ICD-10-CM | POA: Diagnosis not present

## 2022-07-23 DIAGNOSIS — K7460 Unspecified cirrhosis of liver: Secondary | ICD-10-CM | POA: Diagnosis present

## 2022-07-23 DIAGNOSIS — L02611 Cutaneous abscess of right foot: Secondary | ICD-10-CM | POA: Diagnosis present

## 2022-07-23 DIAGNOSIS — I10 Essential (primary) hypertension: Secondary | ICD-10-CM | POA: Diagnosis not present

## 2022-07-23 DIAGNOSIS — Z6841 Body Mass Index (BMI) 40.0 and over, adult: Secondary | ICD-10-CM | POA: Diagnosis not present

## 2022-07-23 DIAGNOSIS — E11621 Type 2 diabetes mellitus with foot ulcer: Secondary | ICD-10-CM | POA: Diagnosis present

## 2022-07-23 DIAGNOSIS — D631 Anemia in chronic kidney disease: Secondary | ICD-10-CM | POA: Diagnosis present

## 2022-07-23 DIAGNOSIS — E1142 Type 2 diabetes mellitus with diabetic polyneuropathy: Secondary | ICD-10-CM | POA: Diagnosis present

## 2022-07-23 DIAGNOSIS — E1151 Type 2 diabetes mellitus with diabetic peripheral angiopathy without gangrene: Secondary | ICD-10-CM | POA: Diagnosis present

## 2022-07-23 DIAGNOSIS — Z8546 Personal history of malignant neoplasm of prostate: Secondary | ICD-10-CM | POA: Diagnosis not present

## 2022-07-23 LAB — CBC
HCT: 23.6 % — ABNORMAL LOW (ref 39.0–52.0)
Hemoglobin: 7.8 g/dL — ABNORMAL LOW (ref 13.0–17.0)
MCH: 29.9 pg (ref 26.0–34.0)
MCHC: 33.1 g/dL (ref 30.0–36.0)
MCV: 90.4 fL (ref 80.0–100.0)
Platelets: 76 10*3/uL — ABNORMAL LOW (ref 150–400)
RBC: 2.61 MIL/uL — ABNORMAL LOW (ref 4.22–5.81)
RDW: 16.3 % — ABNORMAL HIGH (ref 11.5–15.5)
WBC: 8.4 10*3/uL (ref 4.0–10.5)
nRBC: 0 % (ref 0.0–0.2)

## 2022-07-23 LAB — I-STAT ARTERIAL BLOOD GAS, ED
Acid-Base Excess: 4 mmol/L — ABNORMAL HIGH (ref 0.0–2.0)
Bicarbonate: 27.4 mmol/L (ref 20.0–28.0)
Calcium, Ion: 1.04 mmol/L — ABNORMAL LOW (ref 1.15–1.40)
HCT: 23 % — ABNORMAL LOW (ref 39.0–52.0)
Hemoglobin: 7.8 g/dL — ABNORMAL LOW (ref 13.0–17.0)
O2 Saturation: 100 %
Patient temperature: 100
Potassium: 3.9 mmol/L (ref 3.5–5.1)
Sodium: 129 mmol/L — ABNORMAL LOW (ref 135–145)
TCO2: 29 mmol/L (ref 22–32)
pCO2 arterial: 37.6 mmHg (ref 32–48)
pH, Arterial: 7.474 — ABNORMAL HIGH (ref 7.35–7.45)
pO2, Arterial: 483 mmHg — ABNORMAL HIGH (ref 83–108)

## 2022-07-23 LAB — URINALYSIS, ROUTINE W REFLEX MICROSCOPIC
Bilirubin Urine: NEGATIVE
Glucose, UA: NEGATIVE mg/dL
Ketones, ur: NEGATIVE mg/dL
Nitrite: NEGATIVE
Protein, ur: >300 mg/dL — AB
Specific Gravity, Urine: 1.022 (ref 1.005–1.030)
WBC, UA: >50 WBC/hpf (ref 0–5)
pH: 6.5 (ref 5.0–8.0)

## 2022-07-23 LAB — BASIC METABOLIC PANEL
Anion gap: 13 (ref 5–15)
BUN: 49 mg/dL — ABNORMAL HIGH (ref 8–23)
CO2: 26 mmol/L (ref 22–32)
Calcium: 7.8 mg/dL — ABNORMAL LOW (ref 8.9–10.3)
Chloride: 91 mmol/L — ABNORMAL LOW (ref 98–111)
Creatinine, Ser: 8.12 mg/dL — ABNORMAL HIGH (ref 0.61–1.24)
GFR, Estimated: 6 mL/min — ABNORMAL LOW (ref >60)
Glucose, Bld: 130 mg/dL — ABNORMAL HIGH (ref 70–99)
Potassium: 3.8 mmol/L (ref 3.5–5.1)
Sodium: 130 mmol/L — ABNORMAL LOW (ref 135–145)

## 2022-07-23 LAB — GLUCOSE, CAPILLARY
Glucose-Capillary: 114 mg/dL — ABNORMAL HIGH (ref 70–99)
Glucose-Capillary: 102 mg/dL — ABNORMAL HIGH (ref 70–99)
Glucose-Capillary: 123 mg/dL — ABNORMAL HIGH (ref 70–99)
Glucose-Capillary: 97 mg/dL (ref 70–99)
Glucose-Capillary: 129 mg/dL — ABNORMAL HIGH (ref 70–99)

## 2022-07-23 LAB — PHOSPHORUS: Phosphorus: 5 mg/dL — ABNORMAL HIGH (ref 2.5–4.6)

## 2022-07-23 LAB — MAGNESIUM: Magnesium: 1.6 mg/dL — ABNORMAL LOW (ref 1.7–2.4)

## 2022-07-23 LAB — HEPATITIS B SURFACE ANTIGEN: Hepatitis B Surface Ag: NONREACTIVE

## 2022-07-23 LAB — MRSA NEXT GEN BY PCR, NASAL: MRSA by PCR Next Gen: NOT DETECTED

## 2022-07-23 MED ORDER — LEVOTHYROXINE SODIUM 112 MCG PO TABS
112.0000 ug | ORAL_TABLET | Freq: Every day | ORAL | Status: DC
  Administered 2022-07-23 – 2022-07-24 (×2): 112 ug
  Filled 2022-07-23 (×2): qty 1

## 2022-07-23 MED ORDER — DOCUSATE SODIUM 50 MG/5ML PO LIQD: 100.0000 mg | Freq: Two times a day (BID) | ORAL | Status: DC

## 2022-07-23 MED ORDER — CHLORHEXIDINE GLUCONATE CLOTH 2 % EX PADS
6.0000 | MEDICATED_PAD | Freq: Every day | CUTANEOUS | Status: DC
  Administered 2022-07-23: 6 via TOPICAL
  Filled 2022-07-23: qty 6

## 2022-07-23 MED ORDER — PROPOFOL 1000 MG/100ML IV EMUL
0.0000 ug/kg/min | INTRAVENOUS | Status: DC
  Administered 2022-07-23: 30 ug/kg/min via INTRAVENOUS
  Administered 2022-07-23: 25 ug/kg/min via INTRAVENOUS
  Filled 2022-07-23 (×2): qty 100

## 2022-07-23 MED ORDER — INSULIN ASPART 100 UNIT/ML IJ SOLN
0.0000 [IU] | INTRAMUSCULAR | Status: DC
  Administered 2022-07-24: 2 [IU] via SUBCUTANEOUS
  Administered 2022-07-27: 1 [IU] via SUBCUTANEOUS

## 2022-07-23 MED ORDER — PANTOPRAZOLE SODIUM 40 MG IV SOLR
40.0000 mg | Freq: Every day | INTRAVENOUS | Status: DC
  Administered 2022-07-23: 40 mg via INTRAVENOUS
  Filled 2022-07-23: qty 10

## 2022-07-23 MED ORDER — VANCOMYCIN HCL IN DEXTROSE 1-5 GM/200ML-% IV SOLN: 1000.0000 mg | INTRAVENOUS | Status: DC

## 2022-07-23 MED ORDER — VANCOMYCIN VARIABLE DOSE PER UNSTABLE RENAL FUNCTION (PHARMACIST DOSING): Status: DC

## 2022-07-23 MED ORDER — METRONIDAZOLE 500 MG/100ML IV SOLN
500.0000 mg | Freq: Two times a day (BID) | INTRAVENOUS | Status: DC
  Administered 2022-07-23 – 2022-07-26 (×7): 500 mg via INTRAVENOUS
  Filled 2022-07-23 (×7): qty 100

## 2022-07-23 MED ORDER — POLYETHYLENE GLYCOL 3350 17 G PO PACK
17.0000 g | PACK | Freq: Every day | ORAL | Status: DC | PRN
Start: 2022-07-23 — End: 2022-07-23

## 2022-07-23 MED ORDER — SODIUM CHLORIDE 0.9 % IV SOLN
1.0000 g | INTRAVENOUS | Status: DC
  Administered 2022-07-23 – 2022-07-25 (×3): 1 g via INTRAVENOUS
  Filled 2022-07-23 (×6): qty 10

## 2022-07-23 MED ORDER — ORAL CARE MOUTH RINSE
15.0000 mL | OROMUCOSAL | Status: DC
  Administered 2022-07-23 (×6): 15 mL via OROMUCOSAL
  Filled 2022-07-23: qty 15

## 2022-07-23 MED ORDER — MAGNESIUM SULFATE 2 GM/50ML IV SOLN
2.0000 g | Freq: Once | INTRAVENOUS | Status: AC
  Administered 2022-07-23: 2 g via INTRAVENOUS
  Filled 2022-07-23: qty 50

## 2022-07-23 MED ORDER — FENTANYL CITRATE PF 50 MCG/ML IJ SOSY: 25.0000 ug | PREFILLED_SYRINGE | INTRAMUSCULAR | Status: DC | PRN

## 2022-07-23 MED ORDER — HEPARIN SODIUM (PORCINE) 5000 UNIT/ML IJ SOLN
5000.0000 [IU] | Freq: Three times a day (TID) | INTRAMUSCULAR | Status: DC
  Administered 2022-07-23 – 2022-07-29 (×15): 5000 [IU] via SUBCUTANEOUS
  Filled 2022-07-23 (×17): qty 1

## 2022-07-23 MED ORDER — ORAL CARE MOUTH RINSE
15.0000 mL | OROMUCOSAL | Status: DC | PRN
  Filled 2022-07-23: qty 15

## 2022-07-23 MED ORDER — ACETAMINOPHEN 160 MG/5ML PO SOLN: 650.0000 mg | Freq: Four times a day (QID) | ORAL | Status: DC | PRN

## 2022-07-23 MED ORDER — ACETAMINOPHEN 325 MG PO TABS
650.0000 mg | ORAL_TABLET | ORAL | Status: DC | PRN
  Administered 2022-07-24 – 2022-07-27 (×7): 650 mg via ORAL
  Filled 2022-07-23 (×7): qty 2

## 2022-07-23 MED ORDER — CHLORHEXIDINE GLUCONATE CLOTH 2 % EX PADS
6.0000 | MEDICATED_PAD | Freq: Every day | CUTANEOUS | Status: DC
  Administered 2022-07-24: 6 via TOPICAL

## 2022-07-23 MED ORDER — DOCUSATE SODIUM 100 MG PO CAPS
100.0000 mg | ORAL_CAPSULE | Freq: Two times a day (BID) | ORAL | Status: DC | PRN
Start: 2022-07-23 — End: 2022-07-23

## 2022-07-23 MED ORDER — POLYETHYLENE GLYCOL 3350 17 G PO PACK: 17.0000 g | PACK | Freq: Every day | ORAL | Status: DC

## 2022-07-23 MED ORDER — ROSUVASTATIN CALCIUM 20 MG PO TABS: 10.0000 mg | ORAL_TABLET | Freq: Every day | ORAL | Status: DC

## 2022-07-23 NOTE — Progress Notes (Signed)
[facility]Link Physician-Brief Progress Note Patient Name: [name] DOB: [birth year] MRN: [id number]   Date of Service  07/23/2022  HPI/Events of Note  Patient admitted with right lower extremity cellulitis, sepsis, altered mental status requiring intubation for airway protection, occurring against a background of ESRD-DD and failure to thrive.  eICU Interventions  New Patient Evaluation.        [name] [name] 07/23/2022, 3:34 AM

## 2022-07-23 NOTE — ED Notes (Signed)
Report  called to Kennith Maes, RN

## 2022-07-23 NOTE — Procedures (Signed)
No note

## 2022-07-23 NOTE — ED Notes (Signed)
FiO2 decreased to 40% at this time.

## 2022-07-23 NOTE — ED Notes (Signed)
[facility] at bedside for transport. 

## 2022-07-23 NOTE — ED Notes (Signed)
Report given to RT at [facility].

## 2022-07-23 NOTE — Progress Notes (Addendum)
NAME:  [name], MRN:  [id number], DOB:  May 14, 1950, LOS: 0 ADMISSION DATE:  07/22/2022 CONSULTATION DATE:  07/23/2022 REFERRING MD:  Wallace Cullens - EDP CHIEF COMPLAINT:  AMS, weakness, hypoxia   History of Present Illness:  72 year old man who presented to [facility] 4/15 for AMS/weakness and hypoxia. PMHx significant for HTN, HLD, CAD, T2DM (c/b PN with diabetic foot wounds requiring amputations), PVD, ESRD (on home HD MWF), hypothyroidism, prostate CA. Patient reportedly has not missed any dialysis sessions.   On arrival to [facility], patient was altered and weak and hypoxemic with SpO2 85% on RA. He was placed on BiPAP with initial improvement; later mental status deteriorated with responsiveness only to noxious stimuli (sternal rub) and patient was subsequently intubated. Labs were notable for WBC 7.1, Hgb 7.9 (baseline ~8-9), Plt 88. INR 1.5. Na 132, K 4.4, CO2 28, BUN/Cr 48/7.55; transaminases WNL, Alk Phos 169, Tbili 1.4. Lipase 82. LA 0.6. ABG with pH 7.459, pCO2 39.4, pO2 73, Bicarb 27.9. UA with >300 protein, trace leuks. COVID/Flu/RSV negative. CXR unremarkable, R foot XR with subQ gas within soft tissues lateral/plantar to fifth tarsometatarsal joint (local trauma vs. Infection). CT Head NAICA with mild small vessel ischemic changes. CT Chest/A/P with mildly nodular hepatic contour (?cirrhosis), small volume ascites.  PCCM consulted for ICU admission and transfer.  Pertinent Medical History:   Past Medical History:  Diagnosis Date   Acute osteomyelitis of metatarsal bone of left foot    Ambulates with cane    straight cane   Anemia    Cervical myelopathy 02/06/2018   Chronic kidney disease    dailysis M W F- home   Community acquired pneumonia of left lower lobe of lung 08/18/2021   Complication of anesthesia    Coronary artery disease    Dehiscence of amputation stump of left lower extremity    Diabetes mellitus without complication    type2   Diabetic foot ulcer     Diabetic neuropathy 02/06/2018   Dysrhythmia    Gait abnormality 08/24/2018   GERD (gastroesophageal reflux disease)     01/06/20- not current   Gout    History of blood transfusion    History of blood transfusion    History of kidney stones    passed stones   Hypercholesteremia    Hypertension    Hypothyroidism    Neuromuscular disorder    neuropathy left leg and bilateral feet   Neuropathy    Partial nontraumatic amputation of foot, 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 SpO2 85% on RA. Mental status declined, intubated. PCCM consulted for ICU transfer. 4/16 - Arrived from [facility] for ICU admission/management.  Interim History / Subjective:   Remains on the ventilator.  Weaning on PSV  Objective:  Blood pressure (!) 110/54, pulse 68, temperature 99.9 F (37.7 C), resp. rate 18, height  (1.778 m), weight 89.4 kg, SpO2 99 %.    Vent Mode: PSV;CPAP FiO2 (%):  [30 %-100 %] 40 % Set Rate:  [12 bmp-16 bmp] 12 bmp Vt Set:  [560 mL] 560 mL PEEP:  [4 cmH20-5 cmH20] 5 cmH20 Pressure Support:  [10 cmH20] 10 cmH20 Plateau Pressure:  [12 cmH20-16 cmH20] 16 cmH20   Intake/Output Summary (  Last 24 hours) at 07/23/2022 0810 Last data filed at 07/23/2022 0700 Gross per 24 hour  Intake 104.6 ml  Output 55 ml  Net 49.6 ml   Filed Weights   07/22/22 1849 07/23/22 0232  Weight: 90.7 kg 89.4 kg   Physical Examination: Gen:      No acute distress HEENT:  EOMI, sclera anicteric, ET tube Neck:     No masses; no thyromegaly Lungs:    Clear to auscultation bilaterally; normal respiratory effort CV:         Regular rate and rhythm; no murmurs Abd:      + bowel sounds; soft, non-tender; no palpable masses, no  distension Ext:    Left BKA, right lower extremity transmetatarsal amputation, bandaged.  Erythema of the shins Skin:      Warm and dry; no rash Neuro:    Sedated, arousable  Labs/imaging reviewed Significant for sodium 130, BUN/creatinine 49/8.12 Phosphorus 5, mag 1.6 Hemoglobin 7.8, platelets 76  CT chest abdomen pelvis with nodular liver, chronic ascites.  No acute findings. CT head with no acute intracranial abnormality.  Resolved Hospital Problem List:    Assessment & Plan:  Acute hypoxemic respiratory failure Wean.  SBT's as tolerated. Follow intermittent chest x-ray  Acute sepsis present on admission ?Cellulitis of RLE.  Had gas on right foot x-ray yesterday Continue Vanco, cefepime. Add flagyl for anaerobic coverage Will consult Ortho.  He is a patient of Dr. Lajoyce Corners Follow cultures  Acute metabolic encephalopathy secondary to sepsis CT head is unremarkable Follow mental status  HTN HLD CAD Hold home antihypertensives for now, resume as tolerated Continue Crestor  ESRD on home HD Nephrology consult for resumption of HD  T2DM SSI  Diabetic peripheral neuropathy S/p amputations (L BKA, R toes/ray amputation) Supportive care Pain control, resume Lyrica WOC PRN  Hypothyroidism Continue home Synthroid  GERD PPI  Best Practice: (right click and "Reselect all SmartList Selections" daily)   Diet/type: NPO w/ meds via tube DVT prophylaxis: SCDs, SQH GI prophylaxis: PPI Lines: N/A Foley:  Yes, and it is still needed Code Status:  full code Last date of multidisciplinary goals of care discussion [Pending]  Critical care time:   The patient is critically ill with multiple organ system failure and requires high complexity decision making for assessment and support, frequent evaluation and titration of therapies, advanced monitoring, review of radiographic studies and interpretation of complex data.   Critical Care Time devoted to patient care services,  exclusive of separately billable procedures, described in this note is 35 minutes.   Chilton Greathouse MD [facility] See Amion for pager  If no response to pager , please call [phone] until 7pm After 7:00 pm call Elink  [phone] 07/23/2022, 8:10 AM

## 2022-07-23 NOTE — H&P (Signed)
NAME:  [name], MRN:  [id number], DOB:  03-15-1951, LOS: 0 ADMISSION DATE:  07/22/2022 CONSULTATION DATE:  07/23/2022 REFERRING MD:  Wallace Cullens - EDP CHIEF COMPLAINT:  AMS, weakness, hypoxia   History of Present Illness:  72 year old man who presented to [facility] 4/15 for AMS/weakness and hypoxia. PMHx significant for HTN, HLD, CAD, T2DM (c/b PN with diabetic foot wounds requiring amputations), PVD, ESRD (on home HD MWF), hypothyroidism, prostate CA. Patient reportedly has not missed any dialysis sessions.   On arrival to [facility], patient was altered and weak and hypoxemic with SpO2 85% on RA. He was placed on BiPAP with initial improvement; later mental status deteriorated with responsiveness only to noxious stimuli (sternal rub) and patient was subsequently intubated. Labs were notable for WBC 7.1, Hgb 7.9 (baseline ~8-9), Plt 88. INR 1.5. Na 132, K 4.4, CO2 28, BUN/Cr 48/7.55; transaminases WNL, Alk Phos 169, Tbili 1.4. Lipase 82. LA 0.6. ABG with pH 7.459, pCO2 39.4, pO2 73, Bicarb 27.9. UA with >300 protein, trace leuks. COVID/Flu/RSV negative. CXR unremarkable, R foot XR with subQ gas within soft tissues lateral/plantar to fifth tarsometatarsal joint (local trauma vs. Infection). CT Head NAICA with mild small vessel ischemic changes. CT Chest/A/P with mildly nodular hepatic contour (?cirrhosis), small volume ascites.  PCCM consulted for ICU admission and transfer.  Pertinent Medical History:   Past Medical History:  Diagnosis Date   Acute osteomyelitis of metatarsal bone of left foot    Ambulates with cane    straight cane   Anemia    Cervical myelopathy 02/06/2018   Chronic kidney disease    dailysis M W F- home   Community acquired pneumonia of left lower lobe of lung 08/18/2021   Complication of anesthesia    Coronary artery disease    Dehiscence of amputation stump of left lower extremity    Diabetes mellitus without complication    type2   Diabetic foot ulcer     Diabetic neuropathy 02/06/2018   Dysrhythmia    Gait abnormality 08/24/2018   GERD (gastroesophageal reflux disease)     01/06/20- not current   Gout    History of blood transfusion    History of blood transfusion    History of kidney stones    passed stones   Hypercholesteremia    Hypertension    Hypothyroidism    Neuromuscular disorder    neuropathy left leg and bilateral feet   Neuropathy    Partial nontraumatic amputation of foot, 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 SpO2 85% on RA. Mental status declined, intubated. PCCM consulted for ICU transfer. 4/16 - Arrived from [facility] for ICU admission/management.  Interim History / Subjective:  PCCM consulted for ICU admission/transfer.  Objective:  Blood pressure (!) 171/74, pulse 80, temperature 99.9 F (37.7 C), resp. rate 18, height  (1.778 m), weight 90.7 kg, SpO2 100 %.    Vent Mode: PRVC FiO2 (%):  [30 %-100 %] 40 % Set Rate:  [12 bmp-16 bmp] 12 bmp Vt Set:  [560 mL] 560 mL PEEP:  [4 cmH20-5 cmH20] 5 cmH20 Plateau Pressure:  [12 cmH20] 12 cmH20   Intake/Output Summary (Last 24 hours) at 07/23/2022 9147 Last data filed at 07/23/2022 [phone]  Gross per 24 hour  Intake --  Output 40 ml  Net -40 ml   Filed Weights   07/22/22 1849  Weight: 90.7 kg   Physical Examination: General: Acutely ill-appearing elderly man in NAD. Intubated, sedated. HEENT: [place]/AT, anicteric sclera, PERRL, moist mucous membranes. Neuro: Sedated. Responds to noxious stimuli. Following commands intermittently. No spontaneous movement of extremities noted on exam. +Cough and +Gag  CV: RRR, no m/g/r. PULM: Breathing even and unlabored on vent (PEEP 5, FiO2 40%). Lung fields  coarse throughout. GI: Soft, nontender, nondistended. Normoactive bowel sounds. Extremities: Trace bilateral LE edema noted. LLE BKA. RLE with erythema, amputation of toe/ray amputation x 2 Skin: Warm/dry, erythema of RLE as above.  Resolved Hospital Problem List:    Assessment & Plan:  Acute hypoxemic respiratory failure - Admit to ICU for ongoing management - Continue full vent support (4-8cc/kg IBW) - Wean FiO2 for O2 sat > 90% - Daily WUA/SBT once appropriate from a mental status standpoint - VAP bundle - Pulmonary hygiene - PAD protocol for sedation: Propofol and Fentanyl for goal RASS 0 to -1  Acute metabolic encephalopathy Lactic acidosis ?Cellulitis of RLE - Correct any metabolic derangements - Fluid resuscitation as tolerated, caution in the setting of ESRD - Trend WBC, fever curve, LA - F/u Cx data - Continue broad-spectrum antibiotics (cefepime, vanc x 1)  HTN HLD CAD - Hold home antihypertensives for now, resume as tolerated - Continue Crestor  ESRD on home HD - Nephrology consult for resumption of HD - Trend BMP - Replete electrolytes as indicated - Monitor I&Os - Avoid nephrotoxic agents as able  T2DM - SSI - CBGs Q4H - Goal CBG 140-180 - F/u A1c  Diabetic peripheral neuropathy S/p amputations (L BKA, R toes/ray amputation) - Supportive care - Pain control, resume Lyrica - WOC PRN  Hypothyroidism - Continue home Synthroid  GERD - PPI  Best Practice: (right click and "Reselect all SmartList Selections" daily)   Diet/type: NPO w/ meds via tube DVT prophylaxis: SCDs, SQH GI prophylaxis: PPI Lines: N/A Foley:  Yes, and it is still needed Code Status:  full code Last date of multidisciplinary goals of care discussion [Pending]  Labs:  CBC: Recent Labs  Lab 07/22/22 1832 07/22/22 1903 07/22/22 2145 07/23/22 0023  WBC 7.1  --   --   --   NEUTROABS 6.0  --   --   --   HGB 7.9* 7.8* 7.8* 7.8*  HCT 23.6* 23.0* 23.0* 23.0*  MCV 88.1   --   --   --   PLT 88*  --   --   --    Basic Metabolic Panel: Recent Labs  Lab 07/22/22 1832 07/22/22 1903 07/22/22 2145 07/23/22 0023  NA 132* 130* 128* 129*  K 4.4 4.2 3.9 3.9  CL 89*  --   --   --   CO2 28  --   --   --   GLUCOSE 125*  --   --   --   BUN 48*  --   --   --   CREATININE 7.55*  --   --   --   CALCIUM 8.7*  --   --   --    GFR: Estimated Creatinine Clearance: 10 mL/min (A) (by C-G formula based on SCr of 7.55 mg/dL (H)). Recent Labs  Lab 07/22/22 1832 07/22/22 1835 07/22/22 2019  WBC 7.1  --   --   LATICACIDVEN  --  0.6 0.6   Liver Function Tests:  Recent Labs  Lab 07/22/22 1832  AST 27  ALT 16  ALKPHOS 169*  BILITOT 1.4*  PROT 6.3*  ALBUMIN 3.9   Recent Labs  Lab 07/22/22 1832  LIPASE 82*   No results for input(s): "AMMONIA" in the last 168 hours.  ABG:    Component Value Date/Time   PHART 7.474 (H) 07/23/2022 0023   PCO2ART 37.6 07/23/2022 0023   PO2ART 483 (H) 07/23/2022 0023   HCO3 27.4 07/23/2022 0023   TCO2 29 07/23/2022 0023   O2SAT 100 07/23/2022 0023    Coagulation Profile: Recent Labs  Lab 07/22/22 1832  INR 1.5*   Cardiac Enzymes: No results for input(s): "CKTOTAL", "CKMB", "CKMBINDEX", "TROPONINI" in the last 168 hours.  HbA1C: Hgb A1c MFr Bld  Date/Time Value Ref Range Status  05/17/2022 12:52 PM 5.0 4.8 - 5.6 % Final    Comment:    (NOTE) Pre diabetes:          5.7%-6.4%  Diabetes:              >6.4%  Glycemic control for   <7.0% adults with diabetes   05/04/2021 10:42 AM 5.5 4.8 - 5.6 % Final    Comment:    (NOTE) Pre diabetes:          5.7%-6.4%  Diabetes:              >6.4%  Glycemic control for   <7.0% adults with diabetes    CBG: Recent Labs  Lab 07/22/22 1819  GLUCAP 131*   Review of Systems:   Patient is encephalopathic and/or intubated; therefore, history has been obtained from chart review.   Past Medical History:  He,  has a past medical history of Acute osteomyelitis of  metatarsal bone of left foot, Ambulates with cane, Anemia, Cervical myelopathy (02/06/2018), Chronic kidney disease, Community acquired pneumonia of left lower lobe of lung (08/18/2021), Complication of anesthesia, Coronary artery disease, Dehiscence of amputation stump of left lower extremity, Diabetes mellitus without complication, Diabetic foot ulcer, Diabetic neuropathy (02/06/2018), Dysrhythmia, Gait abnormality (08/24/2018), GERD (gastroesophageal reflux disease), Gout, History of blood transfusion, History of blood transfusion, History of kidney stones, Hypercholesteremia, Hypertension, Hypothyroidism, Neuromuscular disorder, Neuropathy, Partial nontraumatic amputation of foot, left (02/20/2021), PONV (postoperative nausea and vomiting), Prostate cancer, and PVD (peripheral vascular disease).   Surgical History:   Past Surgical History:  Procedure Laterality Date   A-FLUTTER ABLATION N/A 04/06/2020   Procedure: A-FLUTTER ABLATION;  Surgeon: Marinus Maw, MD;  Location: [facility] INVASIVE CV LAB;  Service: Cardiovascular;  Laterality: N/A;   AMPUTATION Left 12/25/2013   Procedure: AMPUTATION RAY LEFT 5TH RAY;  Surgeon: Nadara Mustard, MD;  Location: [facility] ORS;  Service: Orthopedics;  Laterality: Left;   AMPUTATION Right 12/15/2018   Procedure: AMPUTATION OF 4TH AND 5TH TOES RIGHT FOOT;  Surgeon: Nadara Mustard, MD;  Location: [facility] OR;  Service: Orthopedics;  Laterality: Right;   AMPUTATION Left 02/02/2021   Procedure: LEFT FOOT 4TH RAY AMPUTATION;  Surgeon: Nadara Mustard, MD;  Location: [facility] OR;  Service: Orthopedics;  Laterality: Left;   AMPUTATION Left 05/09/2021   Procedure: LEFT BELOW KNEE AMPUTATION;  Surgeon: Nadara Mustard, MD;  Location: [facility] OR;  Service: Orthopedics;  Laterality: Left;   APPLICATION OF WOUND VAC Right 12/15/2018   Procedure: APPLICATION OF WOUND VAC;  Surgeon: Nadara Mustard, MD;  Location: [facility] OR;  Service: Orthopedics;  Laterality: Right;   AV FISTULA PLACEMENT Left 03/25/2019    Procedure: LEFT ARM ARTERIOVENOUS (  AV) FISTULA CREATION;  Surgeon: Nada Libman, MD;  Location: [facility] OR;  Service: Vascular;  Laterality: Left;   BACK SURGERY     BASCILIC VEIN TRANSPOSITION Left 05/20/2019   Procedure: SECOND STAGE LEFT BASCILIC VEIN TRANSPOSITION;  Surgeon: Nada Libman, MD;  Location: [facility] OR;  Service: Vascular;  Laterality: Left;   BIOPSY  06/22/2022   Procedure: BIOPSY;  Surgeon: Lemar Lofty., MD;  Location[facility] ENDOSCOPY;  Service: Gastroenterology;;   CARDIAC CATHETERIZATION  02/17/2014   CHOLECYSTECTOMY     COLONOSCOPY  2011   in [place], Normal   COLONOSCOPY N/A 06/23/2022   Procedure: COLONOSCOPY;  Surgeon: Sherrilyn Rist, MD;  Location: [facility] ENDOSCOPY;  Service: Gastroenterology;  Laterality: N/A;   CORONARY ARTERY BYPASS GRAFT  2008   CYSTOSCOPY N/A 06/29/2020   Procedure: CYSTOSCOPY WITH CLOT EVACUATION AND  FULGERATION;  Surgeon: Marcine Matar, MD;  Location: [name] OR;  Service: Urology;  Laterality: N/A;   CYSTOSCOPY WITH FULGERATION Bilateral 06/17/2020   Procedure: CYSTOSCOPY,BILATERAL RETROGRADE, CLOT EVACUATION WITH FULGERATION OF THE BLADDER;  Surgeon: Jannifer Hick, MD;  Location: [facility] OR;  Service: Urology;  Laterality: Bilateral;   CYSTOSCOPY WITH FULGERATION N/A 06/28/2020   Procedure: CYSTOSCOPY WITH CLOT EVACUATION AND FULGERATION OF BLEEDERS;  Surgeon: Marcine Matar, MD;  Location: [facility] OR;  Service: Urology;  Laterality: N/A;  1 HR   ENTEROSCOPY N/A 06/22/2022   Procedure: ENTEROSCOPY;  Surgeon: Meridee Score Netty Starring., MD;  Location: [facility] ENDOSCOPY;  Service: Gastroenterology;  Laterality: N/A;   HEMOSTASIS CLIP PLACEMENT  06/22/2022   Procedure: HEMOSTASIS CLIP PLACEMENT;  Surgeon: Lemar Lofty., MD;  Location: [facility] ENDOSCOPY;  Service: Gastroenterology;;   HOT HEMOSTASIS N/A 06/22/2022   Procedure: HOT HEMOSTASIS (ARGON PLASMA COAGULATION/BICAP);  Surgeon: Lemar Lofty., MD;  Location: [facility] ENDOSCOPY;  Service:  Gastroenterology;  Laterality: N/A;   I & D EXTREMITY Right 12/15/2018   Procedure: DEBRIDEMENT RIGHT FOOT;  Surgeon: Nadara Mustard, MD;  Location: [facility] OR;  Service: Orthopedics;  Laterality: Right;   I & D EXTREMITY Right 08/27/2019   Procedure: PARTIAL CUBOID EXCISION RIGHT FOOT;  Surgeon: Nadara Mustard, MD;  Location: [facility] OR;  Service: Orthopedics;  Laterality: Right;   I & D EXTREMITY Right 01/07/2020   Procedure: RIGHT FOOT EXCISION INFECTED BONE;  Surgeon: Nadara Mustard, MD;  Location: [facility] OR;  Service: Orthopedics;  Laterality: Right;   I & D EXTREMITY Right 05/17/2022   Procedure: IRRIGATION AND DEBRIDEMENT RIGHT FOOT ABSCESS;  Surgeon: Nadara Mustard, MD;  Location: [facility] OR;  Service: Orthopedics;  Laterality: Right;   NECK SURGERY     novemver 2019   POLYPECTOMY  06/22/2022   Procedure: POLYPECTOMY;  Surgeon: Mansouraty, Netty Starring., MD;  Location: [facility] ENDOSCOPY;  Service: Gastroenterology;;   POLYPECTOMY  06/23/2022   Procedure: POLYPECTOMY;  Surgeon: Sherrilyn Rist, MD;  Location: [facility] ENDOSCOPY;  Service: Gastroenterology;;   STUMP REVISION Left 06/27/2021   Procedure: REVISION LEFT BELOW KNEE AMPUTATION;  Surgeon: Nadara Mustard, MD;  Location: [facility] OR;  Service: Orthopedics;  Laterality: Left;   SUBMUCOSAL LIFTING INJECTION  06/22/2022   Procedure: SUBMUCOSAL LIFTING INJECTION;  Surgeon: Meridee Score Netty Starring., MD;  Location: [facility] ENDOSCOPY;  Service: Gastroenterology;;   SUBMUCOSAL TATTOO INJECTION  06/22/2022   Procedure: SUBMUCOSAL TATTOO INJECTION;  Surgeon: Lemar Lofty., MD;  Location: [facility] ENDOSCOPY;  Service: Gastroenterology;;   TRANSURETHRAL RESECTION OF PROSTATE N/A 06/28/2020   Procedure: TRANSURETHRAL RESECTION OF THE PROSTATE (TURP);  Surgeon: Marcine Matar, MD;  Location: [facility] OR;  Service: Urology;  Laterality: N/A;   WISDOM TOOTH EXTRACTION      Social History:   reports that he quit smoking about 33 years ago. His smoking use included cigars. He started smoking  about 49 years ago. He has never used smokeless tobacco. He reports that he does not drink alcohol and does not use drugs.   Family History:  His family history includes CAD in his father; Cancer in his father; Diabetes Mellitus II in his brother, brother, father, and mother; Kidney disease in his brother, brother, father, and mother; Stomach cancer (age of onset: 86) in his brother. There is no history of Colon cancer, Colon polyps, Esophageal cancer, Rectal cancer, or Pancreatic cancer.   Allergies: Allergies  Allergen Reactions   Morphine     Other reaction(s): Delusions (intolerance)   Mushroom Extract Complex Nausea Only    Home Medications: Prior to Admission medications   Medication Sig Start Date End Date Taking? Authorizing Provider  acetaminophen (TYLENOL) 500 MG tablet Take 1,000 mg by mouth every 8 (eight) hours as needed for mild pain or headache.    [provider]  allopurinol (ZYLOPRIM) 100 MG tablet Take 2 tablets (200 mg total) by mouth daily. 05/22/21   Angiulli, Mcarthur Rossetti, PA-C  carvedilol (COREG) 12.5 MG tablet TAKE 1 TABLET BY MOUTH IN THE MORNING AND AT BEDTIME. 12/17/21   Yates Decamp, MD  Darbepoetin Alfa (ARANESP) 100 MCG/0.5ML SOSY injection Inject 0.5 mLs (100 mcg total) into the vein every Thursday with hemodialysis. Patient not taking: Reported on 06/20/2022 05/24/21   Angiulli, Mcarthur Rossetti, PA-C  Insulin Pen Needle 32G X 4 MM MISC Use to inject insulin up to 4 times daily as needed. 05/22/21   Raulkar, Drema Pry, MD  levothyroxine (SYNTHROID) 112 MCG tablet Take 1 tablet (112 mcg total) by mouth daily before breakfast. 05/22/21   Angiulli, Mcarthur Rossetti, PA-C  losartan (COZAAR) 50 MG tablet Take 50 mg by mouth 2 (two) times daily.    [provider]  Methoxy PEG-Epoetin Beta (MIRCERA IJ) Inject into the skin. 09/14/21   [provider]  modafinil (PROVIGIL) 200 MG tablet Take 1 tablet (200 mg total) by mouth daily. 03/11/22   Raulkar, Drema Pry, MD   OZEMPIC, 0.25 OR 0.5 MG/DOSE, 2 MG/3ML SOPN Inject 0.5 mg into the skin once a week. 09/19/21   [provider]  pantoprazole (PROTONIX) 40 MG tablet Take 1 tablet (40 mg total) by mouth 2 (two) times daily before a meal. 06/23/22 07/23/22  Dorcas Carrow, MD  pregabalin (LYRICA) 150 MG capsule Take 1 capsule by mouth in the morning and 2 capsules at night. Patient taking differently: Take 150 mg by mouth 3 (three) times daily. 05/22/21   Angiulli, Mcarthur Rossetti, PA-C  prochlorperazine (COMPAZINE) 5 MG tablet TAKE 1 TABLET BY MOUTH TWICE A DAY 09/06/21   Hilarie Fredrickson, MD  rosuvastatin (CRESTOR) 10 MG tablet Take 1 tablet (10 mg total) by mouth daily. 05/22/21   Angiulli, Mcarthur Rossetti, PA-C  sevelamer carbonate (RENVELA) 800 MG tablet Take 1 tablet (800 mg total) by mouth 3 (three) times daily with meals. Patient taking differently: Take 800 mg by mouth daily. 05/22/21   Angiulli, Mcarthur Rossetti, PA-C  Vitamin D, Ergocalciferol, (DRISDOL) 1.25 MG (50000 UNIT) CAPS capsule Take 1 capsule (50,000 Units total) by mouth every 7 (seven) days. 03/11/22   Raulkar, Drema Pry, MD    Critical care time:  The patient is critically ill with multiple organ system failure and requires high complexity decision making for assessment and support, frequent evaluation and titration of therapies, advanced monitoring, review of radiographic studies and interpretation of complex data.   Critical Care Time devoted to patient care services, exclusive of separately billable procedures, described in this note is 42 minutes.  Tim Lair, PA-C [facility] 07/23/22 2:18 AM  Please see [email or web address] for pager details.  From 7A-7P if no response, please call [phone] After hours, please call ELink [phone]

## 2022-07-23 NOTE — Procedures (Signed)
Extubation Procedure Note  Patient Details:   Name: [name] DOB: 12-08-1950 MRN: [id number]   Airway Documentation:    Vent end date: 07/23/22 Vent end time: 0939   Evaluation  O2 sats: stable throughout Complications: No apparent complications Patient did tolerate procedure well. Bilateral Breath Sounds: Clear, Diminished   Yes  Sawsan Riggio 07/23/2022, 9:39 AM Extubated to 4lpm [place] at this time.  Pt able to speak and say his name.  Vent on standby.

## 2022-07-23 NOTE — Progress Notes (Signed)
Patient received on unit from Hemodialysis via bed. VSS.

## 2022-07-23 NOTE — Consult Note (Signed)
Renal Service Consult Note [facility]  [name] 07/23/2022 Jon Krabbe, MD Requesting Physician: Dr. Isaiah Serge  Reason for Consult: ESRD pt w/ AMS and sepsis HPI: The patient is a 72 y.o. year-old w/ PMH as below who presented to outside ED w/ gen weakness and hypoxemia. Placed on bipap, but MS deteriorated so pt was intubated. Pt was tx'd to [facility] ICU for AHRF, lactic acidosis and AME. He rec'd IV abx. We are asked to see for dialysis.   Pt was extubated this am and is alert and responsive.  Denies any CP or SOB.   ROS - denies CP, no joint pain, no HA, no blurry vision, no rash, no diarrhea, no nausea/ vomiting   Past Medical History  Past Medical History:  Diagnosis Date   Acute osteomyelitis of metatarsal bone of left foot    Ambulates with cane    straight cane   Anemia    Cervical myelopathy 02/06/2018   Chronic kidney disease    dailysis [facility]- home   Community acquired pneumonia of left lower lobe of lung 08/18/2021   Complication of anesthesia    Coronary artery disease    Dehiscence of amputation stump of left lower extremity    Diabetes mellitus without complication    type2   Diabetic foot ulcer    Diabetic neuropathy 02/06/2018   Dysrhythmia    Gait abnormality 08/24/2018   GERD (gastroesophageal reflux disease)     01/06/20- not current   Gout    History of blood transfusion    History of blood transfusion    History of kidney stones    passed stones   Hypercholesteremia    Hypertension    Hypothyroidism    Neuromuscular disorder    neuropathy left leg and bilateral feet   Neuropathy    Partial nontraumatic amputation of foot, 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   PONV (postoperative nausea and vomiting)    Prostate cancer    PVD (peripheral vascular disease)    with amputations   Past Surgical History  Past Surgical History:  Procedure Laterality Date   A-FLUTTER ABLATION N/A 04/06/2020   Procedure: A-FLUTTER ABLATION;  Surgeon: Marinus Maw, MD;  Location: [facility] INVASIVE CV LAB;  Service: Cardiovascular;  Laterality: N/A;   AMPUTATION Left 12/25/2013   Procedure: AMPUTATION RAY LEFT 5TH RAY;  Surgeon: Nadara Mustard, MD;  Location: [facility] ORS;  Service: Orthopedics;  Laterality: Left;   AMPUTATION Right 12/15/2018   Procedure: AMPUTATION OF 4TH AND 5TH TOES RIGHT FOOT;  Surgeon: Nadara Mustard, MD;  Location: [facility] OR;  Service: Orthopedics;  Laterality: Right;   AMPUTATION Left 02/02/2021   Procedure: LEFT FOOT 4TH RAY AMPUTATION;  Surgeon: Nadara Mustard, MD;  Location: [facility] OR;  Service: Orthopedics;  Laterality: Left;   AMPUTATION Left 05/09/2021   Procedure: LEFT BELOW KNEE AMPUTATION;  Surgeon: Nadara Mustard, MD;  Location: [facility] OR;  Service: Orthopedics;  Laterality: Left;   APPLICATION OF WOUND VAC Right 12/15/2018   Procedure: APPLICATION OF WOUND VAC;  Surgeon: Nadara Mustard, MD;  Location: [facility] OR;  Service: Orthopedics;  Laterality: Right;   AV FISTULA PLACEMENT Left 03/25/2019   Procedure: LEFT ARM ARTERIOVENOUS (AV) FISTULA CREATION;  Surgeon: Nada Libman, MD;  Location: [facility] OR;  Service: Vascular;  Laterality: Left;   BACK SURGERY     BASCILIC VEIN TRANSPOSITION Left 05/20/2019   Procedure: SECOND STAGE LEFT BASCILIC VEIN TRANSPOSITION;  Surgeon: Nada Libman, MD;  Location: [facility] OR;  Service: Vascular;  Laterality: Left;   BIOPSY  06/22/2022   Procedure: BIOPSY;  Surgeon: Meridee Score Netty Starring., MD;  Location: [facility] ENDOSCOPY;  Service: Gastroenterology;;   CARDIAC CATHETERIZATION  02/17/2014   CHOLECYSTECTOMY     COLONOSCOPY  2011   in [place], Normal   COLONOSCOPY N/A 06/23/2022   Procedure: COLONOSCOPY;  Surgeon: Sherrilyn Rist, MD;  Location: [facility] ENDOSCOPY;  Service: Gastroenterology;  Laterality: N/A;   CORONARY ARTERY BYPASS GRAFT  2008   CYSTOSCOPY N/A 06/29/2020   Procedure: CYSTOSCOPY WITH CLOT EVACUATION AND  FULGERATION;  Surgeon: Marcine Matar, MD;  Location: [facility] OR;  Service: Urology;  Laterality: N/A;    CYSTOSCOPY WITH FULGERATION Bilateral 06/17/2020   Procedure: CYSTOSCOPY,BILATERAL RETROGRADE, CLOT EVACUATION WITH FULGERATION OF THE BLADDER;  Surgeon: Jannifer Hick, MD;  Location: [facility] OR;  Service: Urology;  Laterality: Bilateral;   CYSTOSCOPY WITH FULGERATION N/A 06/28/2020   Procedure: CYSTOSCOPY WITH CLOT EVACUATION AND FULGERATION OF BLEEDERS;  Surgeon: Marcine Matar, MD;  Location: [facility] OR;  Service: Urology;  Laterality: N/A;  1 HR   ENTEROSCOPY N/A 06/22/2022   Procedure: ENTEROSCOPY;  Surgeon: Meridee Score Netty Starring., MD;  Location: [facility] ENDOSCOPY;  Service: Gastroenterology;  Laterality: N/A;   HEMOSTASIS CLIP PLACEMENT  06/22/2022   Procedure: HEMOSTASIS CLIP PLACEMENT;  Surgeon: Lemar Lofty., MD;  Location: [facility] ENDOSCOPY;  Service: Gastroenterology;;   HOT HEMOSTASIS N/A 06/22/2022   Procedure: HOT HEMOSTASIS (ARGON PLASMA COAGULATION/BICAP);  Surgeon: Lemar Lofty., MD;  Location: [facility] ENDOSCOPY;  Service: Gastroenterology;  Laterality: N/A;   I & D EXTREMITY Right 12/15/2018   Procedure: DEBRIDEMENT RIGHT FOOT;  Surgeon: Nadara Mustard, MD;  Location: [facility] OR;  Service: Orthopedics;  Laterality: Right;   I & D EXTREMITY Right 08/27/2019   Procedure: PARTIAL CUBOID EXCISION RIGHT FOOT;  Surgeon: Nadara Mustard, MD;  Location: [facility] OR;  Service: Orthopedics;  Laterality: Right;   I & D EXTREMITY Right 01/07/2020   Procedure: RIGHT FOOT EXCISION INFECTED BONE;  Surgeon: Nadara Mustard, MD;  Location: [facility] OR;  Service: Orthopedics;  Laterality: Right;   I & D EXTREMITY Right 05/17/2022   Procedure: IRRIGATION AND DEBRIDEMENT RIGHT FOOT ABSCESS;  Surgeon: Nadara Mustard, MD;  Location: [facility] OR;  Service: Orthopedics;  Laterality: Right;   NECK SURGERY     novemver 2019   POLYPECTOMY  06/22/2022   Procedure: POLYPECTOMY;  Surgeon: Mansouraty, Netty Starring., MD;  Location: [facility] ENDOSCOPY;  Service: Gastroenterology;;   POLYPECTOMY  06/23/2022   Procedure: POLYPECTOMY;  Surgeon: Sherrilyn Rist, MD;  Location: [facility] ENDOSCOPY;  Service: Gastroenterology;;   STUMP REVISION Left 06/27/2021   Procedure: REVISION LEFT BELOW KNEE AMPUTATION;  Surgeon: Nadara Mustard, MD;  Location: [facility] OR;  Service: Orthopedics;  Laterality: Left;   SUBMUCOSAL LIFTING INJECTION  06/22/2022   Procedure: SUBMUCOSAL LIFTING INJECTION;  Surgeon: Meridee Score Netty Starring., MD;  Location: [facility] ENDOSCOPY;  Service: Gastroenterology;;   SUBMUCOSAL TATTOO INJECTION  06/22/2022   Procedure: SUBMUCOSAL TATTOO INJECTION;  Surgeon: Lemar Lofty., MD;  Location: [facility] ENDOSCOPY;  Service: Gastroenterology;;   TRANSURETHRAL RESECTION OF PROSTATE N/A 06/28/2020   Procedure: TRANSURETHRAL RESECTION OF THE PROSTATE (TURP);  Surgeon: Marcine Matar, MD;  Location: [facility] OR;  Service: Urology;  Laterality: N/A;   WISDOM TOOTH EXTRACTION     Family History  Family History  Problem Relation Age of Onset   Diabetes Mellitus II Mother    Kidney disease Mother  Diabetes Mellitus II Father    CAD Father    Cancer Father        prostate   Kidney disease Father    Diabetes Mellitus II Brother    Kidney disease Brother    Diabetes Mellitus II Brother    Stomach cancer Brother 72   Kidney disease Brother    Colon cancer Neg Hx    Colon polyps Neg Hx    Esophageal cancer Neg Hx    Rectal cancer Neg Hx    Pancreatic cancer Neg Hx    Social History  reports that he quit smoking about 33 years ago. His smoking use included cigars. He started smoking about 49 years ago. He has never used smokeless tobacco. He reports that he does not drink alcohol and does not use drugs. Allergies  Allergies  Allergen Reactions   Morphine     Other reaction(s): Delusions (intolerance)   Mushroom Extract Complex Nausea Only   Home medications Prior to Admission medications   Medication Sig Start Date End Date Taking? Authorizing Provider  acetaminophen (TYLENOL) 500 MG tablet Take 1,000 mg by mouth every 8 (eight) hours as  needed for mild pain or headache.   Yes [provider]  allopurinol (ZYLOPRIM) 100 MG tablet Take 2 tablets (200 mg total) by mouth daily. 05/22/21  Yes Angiulli, Mcarthur Rossetti, PA-C  carvedilol (COREG) 12.5 MG tablet TAKE 1 TABLET BY MOUTH IN THE MORNING AND AT BEDTIME. Patient taking differently: Take 12.5 mg by mouth 2 (two) times daily with a meal. 12/17/21  Yes Yates Decamp, MD  Darbepoetin Alfa (ARANESP) 100 MCG/0.5ML SOSY injection Inject 0.5 mLs (100 mcg total) into the vein every Thursday with hemodialysis. 05/24/21  Yes Angiulli, Mcarthur Rossetti, PA-C  Insulin Pen Needle 32G X 4 MM MISC Use to inject insulin up to 4 times daily as needed. 05/22/21  Yes Raulkar, Drema Pry, MD  levothyroxine (SYNTHROID) 112 MCG tablet Take 1 tablet (112 mcg total) by mouth daily before breakfast. 05/22/21  Yes Angiulli, Mcarthur Rossetti, PA-C  losartan (COZAAR) 50 MG tablet Take 50 mg by mouth 2 (two) times daily.   Yes [provider]  Methoxy PEG-Epoetin Beta (MIRCERA IJ) Inject into the skin. 09/14/21  Yes [provider]  modafinil (PROVIGIL) 200 MG tablet Take 1 tablet (200 mg total) by mouth daily. 03/11/22  Yes Raulkar, Drema Pry, MD  OZEMPIC, 0.25 OR 0.5 MG/DOSE, 2 MG/3ML SOPN Inject 0.5 mg into the skin once a week. 09/19/21  Yes [provider]  pantoprazole (PROTONIX) 40 MG tablet Take 1 tablet (40 mg total) by mouth 2 (two) times daily before a meal. 06/23/22 07/23/22 Yes Ghimire, Lyndel Safe, MD  pregabalin (LYRICA) 150 MG capsule Take 1 capsule by mouth in the morning and 2 capsules at night. Patient taking differently: Take 150 mg by mouth 3 (three) times daily. 05/22/21  Yes Angiulli, Mcarthur Rossetti, PA-C  prochlorperazine (COMPAZINE) 5 MG tablet TAKE 1 TABLET BY MOUTH TWICE A DAY 09/06/21  Yes Hilarie Fredrickson, MD  rosuvastatin (CRESTOR) 10 MG tablet Take 1 tablet (10 mg total) by mouth daily. 05/22/21  Yes Angiulli, Mcarthur Rossetti, PA-C  sevelamer carbonate (RENVELA) 800 MG tablet Take 1 tablet (800 mg total) by  mouth 3 (three) times daily with meals. Patient taking differently: Take 800 mg by mouth daily. 05/22/21  Yes Angiulli, Mcarthur Rossetti, PA-C  Vitamin D, Ergocalciferol, (DRISDOL) 1.25 MG (50000 UNIT) CAPS capsule Take 1 capsule (50,000 Units total) by mouth every  7 (seven) days. 03/11/22  Yes Horton Chin, MD     Vitals:   07/23/22 0500 07/23/22 0600 07/23/22 0700 07/23/22 0736  BP: 136/65 (!) 144/71 (!) 101/49 (!) 110/54  Pulse: 74 78 65 68  Resp: 18 17 11 18   Temp: 99.7 F (37.6 C) 99.9 F (37.7 C) 99.9 F (37.7 C)   TempSrc:      SpO2: 100% 98% 100% 99%  Weight:      Height:       Exam Gen alert, no distress, coherent and making conversation No rash, cyanosis or gangrene Sclera anicteric, throat clear  No jvd or bruits Chest clear bilat to bases, no rales/ wheezing RRR no RG Abd soft ntnd no mass or ascites +bs GU male MS no joint effusions or deformity Ext no LE or UE edema, no wounds or ulcers Neuro is alert, Ox 3 , nf    LUA AVF +bruit     Home meds include - lyrica, zyloprim, coreg 12.5 bid, synthroid, losartan 50 bid, provigil, ozempic, protonix, crestor, renvela 800 ac tid, vits/ supps/ prns     OP HD: W,F,Sunday (home HD)  3h  Nxt Stage  BFR 425   88 kg  AVF Hep none - last HD was 4/12, post wt not recorded, pre wt 90 - venofer 100 mg IVP weekly     CXR - no edema   CT chest/ abd/ pelvis -->  no No acute cardiopulmonary abnormality. Mildly nodular hepatic contour, correlate for cirrhosis. Small volume abdominopelvic ascites, chronic. Fiducial markers in the prostate. No findings specific for metastatic disease. Additional ancillary findings as above.     Na 130 K+ 3.8   CO2 26  BUN 49  creat 8.12  Ca 7.8  phos 5     Hb 7.8  WBC 8K  plt 76k   Assessment/ Plan: AMS - due to sepsis, resolving today Sepsis - supsected SSTI related to wound on R foot. Getting IV abx per pmd.  ESRD - on home HD 3x / wk. Last HD was 4/12. For HD today. Will follow a TTS  schedule while in thehospital.  HTN/ volume - not much fluid on him, close to dry wt. Goal UF 1-2L w/ HD today Anemia esrd - Hb 7- 9 range, follow, transfuse prn MBD ckd - CCa and phos are in range. Will resume home renvela as binder ac tid.  DM2 - per pmd      Vinson Moselle  MD [facility] 07/23/2022, 10:30 AM  Recent Labs  Lab 07/22/22 1832 07/22/22 1903 07/23/22 0023 07/23/22 0337  HGB 7.9*   < > 7.8* 7.8*  ALBUMIN 3.9  --   --   --   CALCIUM 8.7*  --   --  7.8*  PHOS  --   --   --  5.0*  CREATININE 7.55*  --   --  8.12*  K 4.4   < > 3.9 3.8   < > = values in this interval not displayed.   Inpatient medications:  Chlorhexidine Gluconate Cloth  6 each Topical Daily   docusate  100 mg Per Tube BID   heparin  5,000 Units Subcutaneous Q8H   insulin aspart  0-6 Units Subcutaneous Q4H   levothyroxine  112 mcg Per Tube Q0600   mouth rinse  15 mL Mouth Rinse Q2H   pantoprazole (PROTONIX) IV  40 mg Intravenous QHS   polyethylene glycol  17 g Per Tube Daily   rosuvastatin  10 mg Per Tube Daily   succinylcholine       vancomycin variable dose per unstable renal function (pharmacist dosing)   Does not apply See admin instructions    [START ON 07/24/2022] ceFEPime (MAXIPIME) IV     metronidazole     propofol (DIPRIVAN) infusion 25 mcg/kg/min (07/23/22 0853)   acetaminophen (TYLENOL) oral liquid 160 mg/5 mL, fentaNYL (SUBLIMAZE) injection, fentaNYL (SUBLIMAZE) injection, mouth rinse, succinylcholine

## 2022-07-23 NOTE — Consult Note (Signed)
Reason for Consult:Right foot ulcers Referring Physician: Chilton Greathouse Time called: 0909 Time at bedside: 0953   [name] is an 72 y.o. male.  HPI: [name] was admitted yesterday with encephalopathy thought possibly 2/2 sepsis. He was intubated but able to be extubated this morning. X-rays of the foot showed some gas and orthopedic surgery was consulted to r/o foot as a source. He notes he's had some pain in the foot though it seems stable.  Past Medical History:  Diagnosis Date   Acute osteomyelitis of metatarsal bone of left foot    Ambulates with cane    straight cane   Anemia    Cervical myelopathy 02/06/2018   Chronic kidney disease    dailysis M [facility]- home   Community acquired pneumonia of left lower lobe of lung 08/18/2021   Complication of anesthesia    Coronary artery disease    Dehiscence of amputation stump of left lower extremity    Diabetes mellitus without complication    type2   Diabetic foot ulcer    Diabetic neuropathy 02/06/2018   Dysrhythmia    Gait abnormality 08/24/2018   GERD (gastroesophageal reflux disease)     01/06/20- not current   Gout    History of blood transfusion    History of blood transfusion    History of kidney stones    passed stones   Hypercholesteremia    Hypertension    Hypothyroidism    Neuromuscular disorder    neuropathy left leg and bilateral feet   Neuropathy    Partial nontraumatic amputation of foot, 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   PONV (postoperative nausea and vomiting)    Prostate cancer    PVD (peripheral vascular disease)    with amputations    Past Surgical History:  Procedure Laterality Date   A-FLUTTER ABLATION N/A 04/06/2020   Procedure: A-FLUTTER ABLATION;  Surgeon: Marinus Maw, MD;  [facility]: [facility];  Service: Cardiovascular;  Laterality: N/A;   AMPUTATION Left 12/25/2013   Procedure: AMPUTATION RAY LEFT 5TH RAY;  Surgeon: Nadara Mustard, MD;  Location: [facility] ORS;  Service: Orthopedics;  Laterality: Left;    AMPUTATION Right 12/15/2018   Procedure: AMPUTATION OF 4TH AND 5TH TOES RIGHT FOOT;  Surgeon: Nadara Mustard, MD;  Location: [facility] OR;  Service: Orthopedics;  Laterality: Right;   AMPUTATION Left 02/02/2021   Procedure: LEFT FOOT 4TH RAY AMPUTATION;  Surgeon: Nadara Mustard, MD;  Location[facility] OR;  Service: Orthopedics;  Laterality: Left;   AMPUTATION Left 05/09/2021   Procedure: LEFT BELOW KNEE AMPUTATION;  Surgeon: Nadara Mustard, MD;  Location: [facility] OR;  Service: Orthopedics;  Laterality: Left;   APPLICATION OF WOUND VAC Right 12/15/2018   Procedure: APPLICATION OF WOUND VAC;  Surgeon: Nadara Mustard, MD;  Location: [facility] OR;  Service: Orthopedics;  Laterality: Right;   AV FISTULA PLACEMENT Left 03/25/2019   Procedure: LEFT ARM ARTERIOVENOUS (AV) FISTULA CREATION;  Surgeon: Nada Libman, MD;  Location: [facility] OR;  Service: Vascular;  Laterality: Left;   BACK SURGERY     BASCILIC VEIN TRANSPOSITION Left 05/20/2019   Procedure: SECOND STAGE LEFT BASCILIC VEIN TRANSPOSITION;  Surgeon: Nada Libman, MD;  Location: [facility] OR;  Service: Vascular;  Laterality: Left;   BIOPSY  06/22/2022   Procedure: BIOPSY;  Surgeon: Lemar Lofty., MD;  Location: [facility] ENDOSCOPY;  Service: Gastroenterology;;   CARDIAC CATHETERIZATION  02/17/2014   CHOLECYSTECTOMY     COLONOSCOPY  2011   in [place],  Normal   COLONOSCOPY N/A 06/23/2022   Procedure: COLONOSCOPY;  Surgeon: Sherrilyn Rist, MD;  Location: [facility] ENDOSCOPY;  Service: Gastroenterology;  Laterality: N/A;   CORONARY ARTERY BYPASS GRAFT  2008   CYSTOSCOPY N/A 06/29/2020   Procedure: CYSTOSCOPY WITH CLOT EVACUATION AND  FULGERATION;  Surgeon: Marcine Matar, MD;  Location: [facility] OR;  Service: Urology;  Laterality: N/A;   CYSTOSCOPY WITH FULGERATION Bilateral 06/17/2020   Procedure: CYSTOSCOPY,BILATERAL RETROGRADE, CLOT EVACUATION WITH FULGERATION OF THE BLADDER;  Surgeon: Jannifer Hick, MD;  Location: [facility] OR;  Service: Urology;  Laterality: Bilateral;   CYSTOSCOPY  WITH FULGERATION N/A 06/28/2020   Procedure: CYSTOSCOPY WITH CLOT EVACUATION AND FULGERATION OF BLEEDERS;  Surgeon: Marcine Matar, MD;  Location: [facility] OR;  Service: Urology;  Laterality: N/A;  1 HR   ENTEROSCOPY N/A 06/22/2022   Procedure: ENTEROSCOPY;  Surgeon: Meridee Score Netty Starring., MD;  Location: [facility] ENDOSCOPY;  Service: Gastroenterology;  Laterality: N/A;   HEMOSTASIS CLIP PLACEMENT  06/22/2022   Procedure: HEMOSTASIS CLIP PLACEMENT;  Surgeon: Lemar Lofty., MD;  Location: [facility] ENDOSCOPY;  Service: Gastroenterology;;   HOT HEMOSTASIS N/A 06/22/2022   Procedure: HOT HEMOSTASIS (ARGON PLASMA COAGULATION/BICAP);  Surgeon: Lemar Lofty., MD;  Location: [facility] ENDOSCOPY;  Service: Gastroenterology;  Laterality: N/A;   I & D EXTREMITY Right 12/15/2018   Procedure: DEBRIDEMENT RIGHT FOOT;  Surgeon: Nadara Mustard, MD;  Location: [facility] OR;  Service: Orthopedics;  Laterality: Right;   I & D EXTREMITY Right 08/27/2019   Procedure: PARTIAL CUBOID EXCISION RIGHT FOOT;  Surgeon: Nadara Mustard, MD;  Location: [facility] OR;  Service: Orthopedics;  Laterality: Right;   I & D EXTREMITY Right 01/07/2020   Procedure: RIGHT FOOT EXCISION INFECTED BONE;  Surgeon: Nadara Mustard, MD;  Location: [facility] OR;  Service: Orthopedics;  Laterality: Right;   I & D EXTREMITY Right 05/17/2022   Procedure: IRRIGATION AND DEBRIDEMENT RIGHT FOOT ABSCESS;  Surgeon: Nadara Mustard, MD;  Location: [facility] OR;  Service: Orthopedics;  Laterality: Right;   NECK SURGERY     novemver 2019   POLYPECTOMY  06/22/2022   Procedure: POLYPECTOMY;  Surgeon: Mansouraty, Netty Starring., MD;  Location: [facility] ENDOSCOPY;  Service: Gastroenterology;;   POLYPECTOMY  06/23/2022   Procedure: POLYPECTOMY;  Surgeon: Sherrilyn Rist, MD;  Location: [facility] ENDOSCOPY;  Service: Gastroenterology;;   STUMP REVISION Left 06/27/2021   Procedure: REVISION LEFT BELOW KNEE AMPUTATION;  Surgeon: Nadara Mustard, MD;  Location: [facility] OR;  Service: Orthopedics;  Laterality: Left;    SUBMUCOSAL LIFTING INJECTION  06/22/2022   Procedure: SUBMUCOSAL LIFTING INJECTION;  Surgeon: Meridee Score Netty Starring., MD;  Location: [facility] ENDOSCOPY;  Service: Gastroenterology;;   SUBMUCOSAL TATTOO INJECTION  06/22/2022   Procedure: SUBMUCOSAL TATTOO INJECTION;  Surgeon: Lemar Lofty., MD;  Location: [facility] ENDOSCOPY;  Service: Gastroenterology;;   TRANSURETHRAL RESECTION OF PROSTATE N/A 06/28/2020   Procedure: TRANSURETHRAL RESECTION OF THE PROSTATE (TURP);  Surgeon: Marcine Matar, MD;  Location: [facility] OR;  Service: Urology;  Laterality: N/A;   WISDOM TOOTH EXTRACTION      Family History  Problem Relation Age of Onset   Diabetes Mellitus II Mother    Kidney disease Mother    Diabetes Mellitus II Father    CAD Father    Cancer Father        prostate   Kidney disease Father    Diabetes Mellitus II Brother    Kidney disease Brother    Diabetes Mellitus II Brother    Stomach cancer  Brother 36   Kidney disease Brother    Colon cancer Neg Hx    Colon polyps Neg Hx    Esophageal cancer Neg Hx    Rectal cancer Neg Hx    Pancreatic cancer Neg Hx     Social History:  reports that he quit smoking about 33 years ago. His smoking use included cigars. He started smoking about 49 years ago. He has never used smokeless tobacco. He reports that he does not drink alcohol and does not use drugs.  Allergies:  Allergies  Allergen Reactions   Morphine     Other reaction(s): Delusions (intolerance)   Mushroom Extract Complex Nausea Only    Medications: I have reviewed the patient's current medications.  Results for orders placed or performed during the hospital encounter of 07/22/22 (from the past 48 hour(s))  CBG monitoring, ED     Status: Abnormal   Collection Time: 07/22/22  6:19 PM  Result Value Ref Range   Glucose-Capillary 131 (H) 70 - 99 mg/dL    Comment: Glucose reference range applies only to samples taken after fasting for at least 8 hours.  Comprehensive metabolic panel      Status: Abnormal   Collection Time: 07/22/22  6:32 PM  Result Value Ref Range   Sodium 132 (L) 135 - 145 mmol/L   Potassium 4.4 3.5 - 5.1 mmol/L   Chloride 89 (L) 98 - 111 mmol/L   CO2 28 22 - 32 mmol/L   Glucose, Bld 125 (H) 70 - 99 mg/dL    Comment: Glucose reference range applies only to samples taken after fasting for at least 8 hours.   BUN 48 (H) 8 - 23 mg/dL   Creatinine, Ser 1.61 (H) 0.61 - 1.24 mg/dL   Calcium 8.7 (L) 8.9 - 10.3 mg/dL   Total Protein 6.3 (L) 6.5 - 8.1 g/dL   Albumin 3.9 3.5 - 5.0 g/dL   AST 27 15 - 41 U/L   ALT 16 0 - 44 U/L   Alkaline Phosphatase 169 (H) 38 - 126 U/L   Total Bilirubin 1.4 (H) 0.3 - 1.2 mg/dL   GFR, Estimated 7 (L) >60 mL/min    Comment: (NOTE) Calculated using the CKD-EPI Creatinine Equation (2021)    Anion gap 15 5 - 15    Comment: Performed at [facility], [address] [place], [place] [address]  CBC with Differential     Status: Abnormal   Collection Time: 07/22/22  6:32 PM  Result Value Ref Range   WBC 7.1 4.0 - 10.5 K/uL   RBC 2.68 (L) 4.22 - 5.81 MIL/uL   Hemoglobin 7.9 (L) 13.0 - 17.0 g/dL   HCT 54.0 (L) 98.1 - 19.1 %   MCV 88.1 80.0 - 100.0 fL   MCH 29.5 26.0 - 34.0 pg   MCHC 33.5 30.0 - 36.0 g/dL   RDW 47.8 (H) 29.5 - 62.1 %   Platelets 88 (L) 150 - 400 K/uL    Comment: SPECIMEN CHECKED FOR CLOTS Immature Platelet Fraction may be clinically indicated, consider ordering this additional test [id number] REPEATED TO VERIFY PLATELET COUNT CONFIRMED BY SMEAR    nRBC 0.0 0.0 - 0.2 %   Neutrophils Relative % 85 %   Neutro Abs 6.0 1.7 - 7.7 K/uL   Lymphocytes Relative 4 %   Lymphs Abs 0.3 (L) 0.7 - 4.0 K/uL   Monocytes Relative 10 %   Monocytes Absolute 0.7 0.1 - 1.0 K/uL   Eosinophils Relative 0 %  Eosinophils Absolute 0.0 0.0 - 0.5 K/uL   Basophils Relative 0 %   Basophils Absolute 0.0 0.0 - 0.1 K/uL   Smear Review PLATELETS APPEAR DECREASED     Comment: PLATELET COUNT CONFIRMED BY SMEAR    Immature Granulocytes 1 %   Abs Immature Granulocytes 0.08 (H) 0.00 - 0.07 K/uL    Comment: Performed at [facility], [address], [place], [place] [address]  Protime-INR     Status: Abnormal   Collection Time: 07/22/22  6:32 PM  Result Value Ref Range   Prothrombin Time 18.2 (H) 11.4 - 15.2 seconds   INR 1.5 (H) 0.8 - 1.2    Comment: (NOTE) INR goal varies based on device and disease states. Performed at [facility], [address], [place], [place] [address]   Lipase, blood     Status: Abnormal   Collection Time: 07/22/22  6:32 PM  Result Value Ref Range   Lipase 82 (H) 11 - 51 U/L    Comment: Performed at [facility], [address], [place], [place] [address]  APTT     Status: Abnormal   Collection Time: 07/22/22  6:32 PM  Result Value Ref Range   aPTT 48 (H) 24 - 36 seconds    Comment:        IF BASELINE aPTT IS ELEVATED, SUGGEST PATIENT RISK ASSESSMENT BE USED TO DETERMINE APPROPRIATE ANTICOAGULANT THERAPY. Performed at [facility], [address] [place], [place] [address]   Lactic acid, plasma     Status: None   Collection Time: 07/22/22  6:35 PM  Result Value Ref Range   Lactic Acid, Venous 0.6 0.5 - 1.9 mmol/L    Comment: Performed at [facility], [address] [place], [place] [address]  Resp panel by RT-PCR (RSV, Flu A&B, Covid) Anterior Nasal Swab     Status: None   Collection Time: 07/22/22  6:36 PM   Specimen: Anterior Nasal Swab  Result Value Ref Range   SARS Coronavirus 2 by RT PCR NEGATIVE NEGATIVE    Comment: (NOTE) SARS-CoV-2 target nucleic acids are NOT DETECTED.  The SARS-CoV-2 RNA is generally detectable in upper respiratory specimens during the acute phase of infection. The lowest concentration of SARS-CoV-2 viral copies this assay can detect is 138 copies/mL. A negative result does not preclude SARS-Cov-2 infection and should not be used  as the sole basis for treatment or other patient management decisions. A negative result may occur with  improper specimen collection/handling, submission of specimen other than nasopharyngeal swab, presence of viral mutation(s) within the areas targeted by this assay, and inadequate number of viral copies(<138 copies/mL). A negative result must be combined with clinical observations, patient history, and epidemiological information. The expected result is Negative.  Fact Sheet for Patients:  BloggerCourse.com  Fact Sheet for Healthcare Providers:  SeriousBroker.it  This test is no t yet approved or cleared by the [place] FDA and  has been authorized for detection and/or diagnosis of SARS-CoV-2 by FDA under an Emergency Use Authorization (EUA). This EUA will remain  in effect (meaning this test can be used) for the duration of the COVID-19 declaration under Section 564(b)(1) of the Act, 21 U.S.C.section 360bbb-3(b)(1), unless the authorization is terminated  or revoked sooner.       Influenza A by PCR NEGATIVE NEGATIVE   Influenza B by PCR NEGATIVE NEGATIVE    Comment: (NOTE) The Xpert Xpress SARS-CoV-2/FLU/RSV plus assay is intended as an aid in the diagnosis  of influenza from Nasopharyngeal swab specimens and should not be used as a sole basis for treatment. Nasal washings and aspirates are unacceptable for Xpert Xpress SARS-CoV-2/FLU/RSV testing.  Fact Sheet for Patients: BloggerCourse.com  Fact Sheet for Healthcare Providers: SeriousBroker.it  This test is not yet approved or cleared by the [place] FDA and has been authorized for detection and/or diagnosis of SARS-CoV-2 by FDA under an Emergency Use Authorization (EUA). This EUA will remain in effect (meaning this test can be used) for the duration of the COVID-19 declaration under Section 564(b)(1) of the Act,  21 U.S.C. section 360bbb-3(b)(1), unless the authorization is terminated or revoked.     Resp Syncytial Virus by PCR NEGATIVE NEGATIVE    Comment: (NOTE) Fact Sheet for Patients: BloggerCourse.com  Fact Sheet for Healthcare Providers: SeriousBroker.it  This test is not yet approved or cleared by the [place] FDA and has been authorized for detection and/or diagnosis of SARS-CoV-2 by FDA under an Emergency Use Authorization (EUA). This EUA will remain in effect (meaning this test can be used) for the duration of the COVID-19 declaration under Section 564(b)(1) of the Act, 21 U.S.C. section 360bbb-3(b)(1), unless the authorization is terminated or revoked.  Performed at [facility], [address] [place], [place] [address]   I-Stat venous blood gas, [facility] ED, [facility], [facility])     Status: Abnormal   Collection Time: 07/22/22  7:03 PM  Result Value Ref Range   pH, Ven 7.469 (H) 7.25 - 7.43   pCO2, Ven 40.2 (L) 44 - 60 mmHg   pO2, Ven 59 (H) 32 - 45 mmHg   Bicarbonate 28.9 (H) 20.0 - 28.0 mmol/L   TCO2 30 22 - 32 mmol/L   O2 Saturation 90 %   Acid-Base Excess 5.0 (H) 0.0 - 2.0 mmol/L   Sodium 130 (L) 135 - 145 mmol/L   Potassium 4.2 3.5 - 5.1 mmol/L   Calcium, Ion 1.05 (L) 1.15 - 1.40 mmol/L   HCT 23.0 (L) 39.0 - 52.0 %   Hemoglobin 7.8 (L) 13.0 - 17.0 g/dL   Patient temperature 604.5 F    Collection site IV start    Drawn by Nurse    Sample type VENOUS   Lactic acid, plasma     Status: None   Collection Time: 07/22/22  8:19 PM  Result Value Ref Range   Lactic Acid, Venous 0.6 0.5 - 1.9 mmol/L    Comment: Performed at [facility], [address] [place], [place] [address]  I-Stat arterial blood gas, ED [facility] ED, [facility], [facility])     Status: Abnormal   Collection Time: 07/22/22  9:45 PM  Result Value Ref Range   pH, Arterial 7.458 (H) 7.35 - 7.45   pCO2 arterial 39.4 32 - 48 mmHg   pO2,  Arterial 73 (L) 83 - 108 mmHg   Bicarbonate 27.9 20.0 - 28.0 mmol/L   TCO2 29 22 - 32 mmol/L   O2 Saturation 95 %   Acid-Base Excess 4.0 (H) 0.0 - 2.0 mmol/L   Sodium 128 (L) 135 - 145 mmol/L   Potassium 3.9 3.5 - 5.1 mmol/L   Calcium, Ion 1.07 (L) 1.15 - 1.40 mmol/L   HCT 23.0 (L) 39.0 - 52.0 %   Hemoglobin 7.8 (L) 13.0 - 17.0 g/dL   Collection site RADIAL, ALLEN'S TEST ACCEPTABLE    Drawn by RT    Sample type ARTERIAL   Urinalysis, Routine w reflex microscopic -Urine, Clean Catch     Status:  Abnormal   Collection Time: 07/22/22 11:56 PM  Result Value Ref Range   Color, Urine YELLOW YELLOW   APPearance CLEAR CLEAR   Specific Gravity, Urine 1.022 1.005 - 1.030   pH 6.5 5.0 - 8.0   Glucose, UA NEGATIVE NEGATIVE mg/dL   Hgb urine dipstick SMALL (A) NEGATIVE   Bilirubin Urine NEGATIVE NEGATIVE   Ketones, ur NEGATIVE NEGATIVE mg/dL   Protein, ur >161 (A) NEGATIVE mg/dL   Nitrite NEGATIVE NEGATIVE   Leukocytes,Ua TRACE (A) NEGATIVE   RBC / HPF 0-5 0 - 5 RBC/hpf   WBC, UA >50 0 - 5 WBC/hpf   Bacteria, UA RARE (A) NONE SEEN   Squamous Epithelial / HPF 0-5 0 - 5 /HPF   Hyaline Casts, UA PRESENT     Comment: Performed at [facility], [address], [place], [place] [address]  I-Stat arterial blood gas, ED     Status: Abnormal   Collection Time: 07/23/22 12:23 AM  Result Value Ref Range   pH, Arterial 7.474 (H) 7.35 - 7.45   pCO2 arterial 37.6 32 - 48 mmHg   pO2, Arterial 483 (H) 83 - 108 mmHg   Bicarbonate 27.4 20.0 - 28.0 mmol/L   TCO2 29 22 - 32 mmol/L   O2 Saturation 100 %   Acid-Base Excess 4.0 (H) 0.0 - 2.0 mmol/L   Sodium 129 (L) 135 - 145 mmol/L   Potassium 3.9 3.5 - 5.1 mmol/L   Calcium, Ion 1.04 (L) 1.15 - 1.40 mmol/L   HCT 23.0 (L) 39.0 - 52.0 %   Hemoglobin 7.8 (L) 13.0 - 17.0 g/dL   Patient temperature 540.9 F    Collection site Brachial    Drawn by RT    Sample type ARTERIAL   MRSA Next Gen by PCR, Nasal     Status: None   Collection  Time: 07/23/22  2:30 AM   Specimen: Nasal Mucosa; Nasal Swab  Result Value Ref Range   MRSA by PCR Next Gen NOT DETECTED NOT DETECTED    Comment: (NOTE) The GeneXpert MRSA Assay (FDA approved for NASAL specimens only), is one component of a comprehensive MRSA colonization surveillance program. It is not intended to diagnose MRSA infection nor to guide or monitor treatment for MRSA infections. Test performance is not FDA approved in patients less than 68 years old. Performed at [facility], [address], [place], [place] [address]   Glucose, capillary     Status: Abnormal   Collection Time: 07/23/22  2:30 AM  Result Value Ref Range   Glucose-Capillary 114 (H) 70 - 99 mg/dL    Comment: Glucose reference range applies only to samples taken after fasting for at least 8 hours.  CBC     Status: Abnormal   Collection Time: 07/23/22  3:37 AM  Result Value Ref Range   WBC 8.4 4.0 - 10.5 K/uL   RBC 2.61 (L) 4.22 - 5.81 MIL/uL   Hemoglobin 7.8 (L) 13.0 - 17.0 g/dL   HCT 47.8 (L) 29.5 - 62.1 %   MCV 90.4 80.0 - 100.0 fL   MCH 29.9 26.0 - 34.0 pg   MCHC 33.1 30.0 - 36.0 g/dL   RDW 30.8 (H) 65.7 - 84.6 %   Platelets 76 (L) 150 - 400 K/uL    Comment: Immature Platelet Fraction may be clinically indicated, consider ordering this additional test [id number] REPEATED TO VERIFY    nRBC 0.0 0.0 - 0.2 %    Comment: Performed at [facility], [address] [place], [place] [address]  Magnesium     Status: Abnormal   Collection Time: 07/23/22  3:37 AM  Result Value Ref Range   Magnesium 1.6 (L) 1.7 - 2.4 mg/dL    Comment: Performed at [facility], [address], [place], [place] [address]  Phosphorus     Status: Abnormal   Collection Time: 07/23/22  3:37 AM  Result Value Ref Range   Phosphorus 5.0 (H) 2.5 - 4.6 mg/dL    Comment: Performed at [facility], [address] [place], [place] [address]  Basic metabolic panel     Status: Abnormal   Collection Time:  07/23/22  3:37 AM  Result Value Ref Range   Sodium 130 (L) 135 - 145 mmol/L   Potassium 3.8 3.5 - 5.1 mmol/L   Chloride 91 (L) 98 - 111 mmol/L   CO2 26 22 - 32 mmol/L   Glucose, Bld 130 (H) 70 - 99 mg/dL    Comment: Glucose reference range applies only to samples taken after fasting for at least 8 hours.   BUN 49 (H) 8 - 23 mg/dL   Creatinine, Ser 4.69 (H) 0.61 - 1.24 mg/dL   Calcium 7.8 (L) 8.9 - 10.3 mg/dL   GFR, Estimated 6 (L) >60 mL/min    Comment: (NOTE) Calculated using the CKD-EPI Creatinine Equation (2021)    Anion gap 13 5 - 15    Comment: Performed at [facility], [address], [place], [place] [address]  Glucose, capillary     Status: Abnormal   Collection Time: 07/23/22  8:01 AM  Result Value Ref Range   Glucose-Capillary 102 (H) 70 - 99 mg/dL    Comment: Glucose reference range applies only to samples taken after fasting for at least 8 hours.    DG Abdomen 1 View  Result Date: 07/23/2022 CLINICAL DATA:  NG tube placement EXAM: ABDOMEN - 1 VIEW COMPARISON:  None Available. FINDINGS: The bowel gas pattern is normal. No radio-opaque calculi or other significant radiographic abnormality are seen. Enteric tube tip projects over the left upper quadrant. The enteric tube is incompletely visualized on this image. On chest radiograph performed immediately after this abdominal radiograph at 11:44 p.m. the enteric tube tip is in the stomach with side port likely near the GE junction. IMPRESSION: Enteric tube tip projects over the left upper quadrant. The enteric tube is incompletely visualized on this image. On chest radiograph performed immediately after this abdominal radiograph at 11:44 p.m. the enteric tube tip is in the stomach with side port likely near the GE junction. Electronically Signed   By: Minerva Fester M.D.   On: 07/23/2022 00:13   DG Chest Portable 1 View  Result Date: 07/23/2022 CLINICAL DATA:  Post intubation EXAM: PORTABLE CHEST 1 VIEW COMPARISON:  Chest  radiographs and CT 07/22/2022. FINDINGS: Endotracheal tube in the intrathoracic trachea approximately 6.9 cm from the carina. Subdiaphragmatic enteric tube. Sternotomy. Cervical spine fusion hardware. Low lung volumes accentuate pulmonary vascularity and cardiomediastinal silhouette. No focal consolidation, pleural effusion, or pneumothorax. No displaced rib fractures. IMPRESSION: Endotracheal tube in the intrathoracic trachea approximately 6.9 cm from the carina. Electronically Signed   By: Minerva Fester M.D.   On: 07/23/2022 00:10   CT CHEST ABDOMEN PELVIS W CONTRAST  Result Date: 07/22/2022 CLINICAL DATA:  Altered mental status, weakness, sepsis EXAM: CT CHEST, ABDOMEN, AND PELVIS WITH CONTRAST TECHNIQUE: Multidetector CT imaging of the chest, abdomen and pelvis  was performed following the standard protocol during bolus administration of intravenous contrast. RADIATION DOSE REDUCTION: This exam was performed according to the departmental dose-optimization program which includes automated exposure control, adjustment of the mA and/or kV according to patient size and/or use of iterative reconstruction technique. CONTRAST:  43mL OMNIPAQUE IOHEXOL 300 MG/ML  SOLN COMPARISON:  CT abdomen/pelvis dated 06/20/2022 FINDINGS: CT CHEST FINDINGS Cardiovascular: The heart is normal in size. No pericardial effusion. No evidence of thoracic aortic aneurysm. Atherosclerotic calcifications of the aortic arch. Severe three-vessel coronary atherosclerosis. Postsurgical changes related to prior CABG. Mediastinum/Nodes: No suspicious mediastinal lymphadenopathy. Visualized thyroid is unremarkable. Lungs/Pleura: Evaluation of the lung parenchyma is constrained by respiratory motion. Within that constraint, there are no suspicious pulmonary nodules. Very mild dependent bilateral lower lobe atelectasis. No focal consolidation. No pleural effusion or pneumothorax. Musculoskeletal: Degenerative changes of the mid/lower thoracic  spine. Cervical spine fixation hardware, incompletely visualized. Median sternotomy. Stable sclerotic lesion along the left T10 vertebral body (sagittal image 87), unchanged since 2022. While indeterminate in this clinical setting, stability favors a benign bone island. CT ABDOMEN PELVIS FINDINGS Hepatobiliary: Mildly nodular hepatic contour. Status post cholecystectomy. No intrahepatic or extrahepatic duct dilatation. Pancreas: Within normal limits. Spleen: Within normal limits. Adrenals/Urinary Tract: Stable 3.9 cm right adrenal mass with macroscopic fat, compatible with a benign myelolipoma. Nodular thickening of the left adrenal gland without discrete mass. Bilateral renal cysts, including 3.7 cm posterior right upper pole renal cyst (series 3/image 92). No hydronephrosis. Bladder is thick-walled although underdistended. Stomach/Bowel: Stomach is within normal limits. No evidence of bowel obstruction. Normal appendix (series 3/image 100). Sigmoid diverticulosis, without evidence of diverticulitis. Vascular/Lymphatic: No evidence of abdominal aortic aneurysm. Atherosclerotic calcifications of the abdominal aorta and branch vessels. No suspicious abdominopelvic lymphadenopathy. Reproductive: Prostatomegaly, with enlargement of the central gland indenting the base of the bladder, suggesting BPH. Additional fiducial markers. Other: Trace perihepatic and perisplenic ascites. Small volume pelvic ascites. This appearance is similar to the prior. Musculoskeletal: Degenerative changes of the lumbar spine. IMPRESSION: No acute cardiopulmonary abnormality. Mildly nodular hepatic contour, correlate for cirrhosis. Small volume abdominopelvic ascites, chronic. Fiducial markers in the prostate. No findings specific for metastatic disease. Additional ancillary findings as above. Electronically Signed   By: Charline Bills M.D.   On: 07/22/2022 20:53   CT Head Wo Contrast  Result Date: 07/22/2022 CLINICAL DATA:  Altered  mental status, delirium EXAM: CT HEAD WITHOUT CONTRAST TECHNIQUE: Contiguous axial images were obtained from the base of the skull through the vertex without intravenous contrast. RADIATION DOSE REDUCTION: This exam was performed according to the departmental dose-optimization program which includes automated exposure control, adjustment of the mA and/or kV according to patient size and/or use of iterative reconstruction technique. COMPARISON:  MRI brain dated 10/16/2021 FINDINGS: Brain: No evidence of acute infarction, hemorrhage, hydrocephalus, extra-axial collection or mass lesion/mass effect. Mild subcortical white matter and periventricular small vessel ischemic changes. Vascular: Intracranial atherosclerosis. Skull: Normal. Negative for fracture or focal lesion. Sinuses/Orbits: The visualized paranasal sinuses are essentially clear. The mastoid air cells are unopacified. Other: None. IMPRESSION: No acute intracranial abnormality. Mild small vessel ischemic changes. Electronically Signed   By: Charline Bills M.D.   On: 07/22/2022 20:46   DG Foot 2 Views Right  Result Date: 07/22/2022 CLINICAL DATA:  Osteomyelitis EXAM: RIGHT FOOT - 2 VIEW COMPARISON:  01/03/2020 FINDINGS: Two view radiograph of the right foot is limited by obliquity on frontal radiograph. Transmetatarsal amputation of the fourth and fifth digits is again noted. No acute  fracture or dislocation. Large superior calcaneal spur. Vascular calcifications are noted. There is subcutaneous gas noted within the soft tissues lateral and plantar to the fifth tarsometatarsal joint, however, no superimposed osseous erosion is identified to suggest osteomyelitis. IMPRESSION: 1. Subcutaneous gas within the soft tissues lateral and plantar to the fifth tarsometatarsal joint, reflective of local trauma and/or infection. No superimposed osseous erosion identified to suggest osteomyelitis. Electronically Signed   By: Helyn Numbers M.D.   On: 07/22/2022  19:25   DG Chest Portable 1 View  Result Date: 07/22/2022 CLINICAL DATA:  Sepsis EXAM: PORTABLE CHEST 1 VIEW COMPARISON:  06/20/2022 FINDINGS: Lungs are clear. No pneumothorax or pleural effusion. Coronary artery bypass grafting has been performed. Cardiac size within normal limits. Pulmonary vascularity is normal. No acute bone abnormality. IMPRESSION: 1. No active disease. Electronically Signed   By: Helyn Numbers M.D.   On: 07/22/2022 19:22    Review of Systems  Constitutional:  Negative for chills, diaphoresis and fever.  HENT:  Negative for ear discharge, ear pain, hearing loss and tinnitus.   Eyes:  Negative for photophobia and pain.  Respiratory:  Negative for cough and shortness of breath.   Cardiovascular:  Negative for chest pain.  Gastrointestinal:  Negative for abdominal pain, nausea and vomiting.  Genitourinary:  Negative for dysuria, flank pain, frequency and urgency.  Musculoskeletal:  Positive for arthralgias (Right foot, unchanged). Negative for back pain, myalgias and neck pain.  Neurological:  Negative for dizziness and headaches.  Hematological:  Does not bruise/bleed easily.  Psychiatric/Behavioral:  The patient is not nervous/anxious.    Blood pressure (!) 110/54, pulse 68, temperature 99.9 F (37.7 C), resp. rate 18, height 5\' 10"  (1.778 m), weight 89.4 kg, SpO2 99 %. Physical Exam Constitutional:      General: He is not in acute distress.    Appearance: He is well-developed. He is not diaphoretic.  HENT:     Head: Normocephalic and atraumatic.  Eyes:     General: No scleral icterus.       Right eye: No discharge.        Left eye: No discharge.     Conjunctiva/sclera: Conjunctivae normal.  Cardiovascular:     Rate and Rhythm: Normal rate and regular rhythm.  Pulmonary:     Effort: Pulmonary effort is normal. No respiratory distress.  Musculoskeletal:     Cervical back: Normal range of motion.  Feet:     Comments: Right foot -- Ulceration lateral heel,  marginally increased in size with some necrosis of base compared with 2 weeks ago. Sole ulceration appears roughly stable. No SQE, no malodor, no active discharge. Skin:    General: Skin is warm and dry.  Neurological:     Mental Status: He is alert.  Psychiatric:        Mood and Affect: Mood normal.        Behavior: Behavior normal.     Assessment/Plan: Right foot ulceration -- Appear roughly stable though the lateral ulceration does appear somewhat larger and more necrotic. Will have Dr. Lajoyce Corners evaluate later today or in AM.    Freeman Caldron, PA-C Orthopedic Surgery [phone] 07/23/2022, 9:58 AM

## 2022-07-23 NOTE — ED Notes (Signed)
Family updated, Pinchos Cordoves, wife.

## 2022-07-23 NOTE — Progress Notes (Signed)
   07/23/22 1818  Vitals  Temp 98.9 F (37.2 C)  Pulse Rate 89  Resp 20  BP (!) 142/61  SpO2 99 %  O2 Device Nasal Cannula  Weight  (unable to obtain)  Type of Weight Post-Dialysis  Oxygen Therapy  O2 Flow Rate (L/min) 4 L/min  Post Treatment  Dialyzer Clearance Lightly streaked  Duration of HD Treatment -hour(s) 3.5 hour(s)  Hemodialysis Intake (mL) 0 mL  Liters Processed 84  Fluid Removed (mL) 1500 mL  Tolerated HD Treatment Yes  AVG/AVF Arterial Site Held (minutes) 10 minutes  AVG/AVF Venous Site Held (minutes) 10 minutes   Received patient in bed to unit.  Alert and oriented.  Informed consent signed and in chart.   TX duration:3.5  Patient tolerated well.  Transported back to the room  Alert, without acute distress.  Hand-off given to patient's nurse.   Access used: LUAF Access issues: no complications  Total UF removed: 1500 Medication(s) given: none   [facility]

## 2022-07-23 NOTE — Consult Note (Addendum)
WOC Nurse Consult Note: Reason for Consult: Consult requested for right foot wound.  Performed remotely after review of progress notes and photos in the EMR.  Wound type: Right midfoot is a chronic full thickness wound, dark red, approx 2X2X1cm, according to recent ortho notes.  Pt has been followed as an outpatient by Dr Lajoyce Corners of the ortho service. According to progress notes, "He is status post irrigation debridement with Kerecis placement on February 9 to the wound on the plantar aspect of his foot. He is also had Kerecis powder applied in the office March 11 March 18 as well as March 26." He was recently seen in the office for a wound assessment on 4/5.  Continue present plan of care as previously ordered.  Dressing procedure/placement/frequency: Topical treatment orders provided for bedside nurses to perform as follows: Change dressing to right foot wound Q Tues/Thurs/Sat as follows: apply adaptic over right foot wound (there is a skin graft product intact, do not scrub off) and cover adaptic with 4X4s, apply kerlex and ace wrap in a sprial fashion, beginning just behind toes to mid calf. Pt should resume follow-up with Dr Lajoyce Corners after discharge.  Please re-consult if further assistance is needed.  Thank-you,  Cammie Mcgee MSN, RN, CWOCN, [place], CNS [phone]

## 2022-07-23 NOTE — Progress Notes (Signed)
Patient transported to dialysis unit bay 9 without complications.

## 2022-07-24 ENCOUNTER — Inpatient Hospital Stay (HOSPITAL_COMMUNITY): Payer: Medicare Other

## 2022-07-24 DIAGNOSIS — J9600 Acute respiratory failure, unspecified whether with hypoxia or hypercapnia: Secondary | ICD-10-CM | POA: Diagnosis not present

## 2022-07-24 DIAGNOSIS — L03115 Cellulitis of right lower limb: Secondary | ICD-10-CM | POA: Diagnosis not present

## 2022-07-24 LAB — GLUCOSE, CAPILLARY
Glucose-Capillary: 104 mg/dL — ABNORMAL HIGH (ref 70–99)
Glucose-Capillary: 139 mg/dL — ABNORMAL HIGH (ref 70–99)
Glucose-Capillary: 147 mg/dL — ABNORMAL HIGH (ref 70–99)
Glucose-Capillary: 174 mg/dL — ABNORMAL HIGH (ref 70–99)
Glucose-Capillary: 210 mg/dL — ABNORMAL HIGH (ref 70–99)
Glucose-Capillary: 89 mg/dL (ref 70–99)

## 2022-07-24 LAB — TYPE AND SCREEN
Antibody Screen: NEGATIVE
Unit division: 0

## 2022-07-24 LAB — HEPATITIS B SURFACE ANTIBODY, QUANTITATIVE: Hep B S AB Quant (Post): <3.5 m[IU]/mL — ABNORMAL LOW (ref >9.9)

## 2022-07-24 LAB — TRIGLYCERIDES: Triglycerides: 142 mg/dL (ref <150)

## 2022-07-24 LAB — BPAM RBC
Blood Product Expiration Date: 202405162359
ISSUE DATE / TIME: 202404171208

## 2022-07-24 LAB — PREPARE RBC (CROSSMATCH)

## 2022-07-24 MED ORDER — VANCOMYCIN HCL IN DEXTROSE 1-5 GM/200ML-% IV SOLN
1000.0000 mg | Freq: Once | INTRAVENOUS | Status: AC
  Administered 2022-07-24: 1000 mg via INTRAVENOUS
  Filled 2022-07-24: qty 200

## 2022-07-24 MED ORDER — DOCUSATE SODIUM 100 MG PO CAPS
100.0000 mg | ORAL_CAPSULE | Freq: Two times a day (BID) | ORAL | Status: DC
  Administered 2022-07-24 – 2022-07-29 (×8): 100 mg via ORAL
  Filled 2022-07-24 (×9): qty 1

## 2022-07-24 MED ORDER — ROSUVASTATIN CALCIUM 5 MG PO TABS
10.0000 mg | ORAL_TABLET | Freq: Every day | ORAL | Status: DC
  Administered 2022-07-24 – 2022-07-29 (×6): 10 mg via ORAL
  Filled 2022-07-24: qty 1
  Filled 2022-07-24 (×5): qty 2

## 2022-07-24 MED ORDER — VANCOMYCIN HCL IN DEXTROSE 1-5 GM/200ML-% IV SOLN
1000.0000 mg | INTRAVENOUS | Status: DC
  Administered 2022-07-25: 1000 mg via INTRAVENOUS
  Filled 2022-07-24: qty 200

## 2022-07-24 MED ORDER — POLYETHYLENE GLYCOL 3350 17 G PO PACK
17.0000 g | PACK | Freq: Every day | ORAL | Status: DC
  Administered 2022-07-29: 17 g via ORAL
  Filled 2022-07-24 (×4): qty 1

## 2022-07-24 MED ORDER — SODIUM CHLORIDE 0.9% IV SOLUTION: Freq: Once | INTRAVENOUS | Status: DC

## 2022-07-24 MED ORDER — PANTOPRAZOLE SODIUM 40 MG PO TBEC
40.0000 mg | DELAYED_RELEASE_TABLET | Freq: Every day | ORAL | Status: DC
  Administered 2022-07-24 – 2022-07-28 (×5): 40 mg via ORAL
  Filled 2022-07-24 (×5): qty 1

## 2022-07-24 MED ORDER — CHLORHEXIDINE GLUCONATE CLOTH 2 % EX PADS: 6.0000 | MEDICATED_PAD | Freq: Every day | CUTANEOUS | Status: DC

## 2022-07-24 MED ORDER — LEVOTHYROXINE SODIUM 112 MCG PO TABS
112.0000 ug | ORAL_TABLET | Freq: Every day | ORAL | Status: DC
  Administered 2022-07-25 – 2022-07-29 (×4): 112 ug via ORAL
  Filled 2022-07-24 (×5): qty 1

## 2022-07-24 NOTE — Progress Notes (Signed)
Wife at bedside. Pt had previously declined surgery with Lajoyce Corners MD earlier today. However, after speaking with wife, he has requested to not wait for surgery and instead proceed with Friday. Lajoyce Corners MD made aware and has posted pt for Friday. Pt and wife made aware.

## 2022-07-24 NOTE — Progress Notes (Signed)
Pt receives home HD through [facility]. Will assist as needed.   Olivia Canter Renal Navigator [phone]

## 2022-07-24 NOTE — H&P (View-Only) (Signed)
Patient ID: [name], male   DOB: 07/27/1950, 71 y.o.   MRN: [id number] Patient is extubated alert and oriented.  Plan for transfer to the floor.  Reviewed recommendation to proceed with a below-knee amputation on the right on Friday.  Patient states that he will be going to his wife's birthday on Saturday and would proceed with surgery next week.  Discussed medical risk and mortality risk.  Patient states he understands and will be going to his wife's birthday on Saturday. 

## 2022-07-24 NOTE — Progress Notes (Signed)
[facility] Progress Note  Subjective: seen in ICU room, no c/o's today.   Vitals:   07/24/22 0900 07/24/22 1000 07/24/22 1100 07/24/22 1107  BP:  129/71 134/64   Pulse:  66 66   Resp: 15 15 15    Temp:    98.8 F (37.1 C)  TempSrc:    Bladder  SpO2:  96% 99%   Weight:      Height:        Exam: Gen alert, no distress No jvd or bruits Chest clear bilat to bases RRR no RG Abd soft ntnd no mass or ascites +bs Ext no LE edema Neuro is alert, Ox 3 , nf    LUA AVF +bruit      Home meds include - lyrica, zyloprim, coreg 12.5 bid, synthroid, losartan 50 bid, provigil, ozempic, protonix, crestor, renvela 800 ac tid, vits/ supps/ prns        OP HD: W,F,Sunday (home HD)  3h  Nxt Stage  BFR 425   88 kg  AVF Hep none - last HD was 4/12, post wt not recorded, pre wt 90 - venofer 100 mg IVP weekly     CXR - no edema   CT chest/ abd/ pelvis -->  no No acute cardiopulmonary abnormality. Mildly nodular hepatic contour, correlate for cirrhosis. Small volume abdominopelvic ascites, chronic. Fiducial markers in the prostate. No findings specific for metastatic dz   Assessment/ Plan: RLE celluitis - SQ air in xrays of R foot, per orthopedics needs R BKA which is planned for this Friday. Getting IV abx.  AMS - due to sepsis, resolving Sepsis - BP's stable, on IV abx ESRD - on home HD 3x / wk. Will follow TTS schedule in the hospital. HD tomorrow.   HTN/ volume - not much fluid on him, just under dry wt. Small UF goal Anemia esrd - Hb 7- 9 range, follow, transfuse prn MBD ckd - CCa and phos are in range. Cont renvela 1 ac.  DM2 - per pmd      Jon Moselle MD [facility] 07/24/2022, 12:18 PM  Recent Labs  Lab 07/22/22 1832 07/22/22 1903 07/23/22 0023 07/23/22 0337  HGB 7.9*   < > 7.8* 7.8*  ALBUMIN 3.9  --   --   --   CALCIUM 8.7*  --   --  7.8*  PHOS  --   --   --  5.0*  CREATININE 7.55*  --   --  8.12*  K 4.4   < > 3.9 3.8   < > = values in this interval not  displayed.   No results for input(s): "IRON", "TIBC", "FERRITIN" in the last 168 hours. Inpatient medications:  Chlorhexidine Gluconate Cloth  6 each Topical Daily   Chlorhexidine Gluconate Cloth  6 each Topical Q0600   docusate sodium  100 mg Oral BID   heparin  5,000 Units Subcutaneous Q8H   insulin aspart  0-6 Units Subcutaneous Q4H   [START ON 07/25/2022] levothyroxine  112 mcg Oral Q0600   pantoprazole  40 mg Oral QHS   polyethylene glycol  17 g Oral Daily   rosuvastatin  10 mg Oral Daily    ceFEPime (MAXIPIME) IV 200 mL/hr at 07/23/22 2200   metronidazole 500 mg (07/24/22 1043)   [START ON 07/25/2022] vancomycin     acetaminophen, mouth rinse

## 2022-07-24 NOTE — Progress Notes (Signed)
Patient ID: [name], male   DOB: [birth year], 72 y.o.   MRN: [id number] Patient is extubated alert and oriented.  Plan for transfer to the floor.  Reviewed recommendation to proceed with a below-knee amputation on the right on Friday.  Patient states that he will be going to his wife's birthday on Saturday and would proceed with surgery next week.  Discussed medical risk and mortality risk.  Patient states he understands and will be going to his wife's birthday on Saturday.

## 2022-07-24 NOTE — Progress Notes (Signed)
Report given to receiving RN on 5M08. Pt transported with all belongings VSS. Wife updated regarding transfer.

## 2022-07-24 NOTE — Progress Notes (Signed)
PROGRESS NOTE    [name]  ZOX:[id number] DOB: 22-Jul-1950 DOA: 07/22/2022 PCP: Adolph Pollack, FNP   Brief Narrative:  72 year old male with history of hypertension, hyperlipidemia, CAD, diabetes mellitus type 2, peripheral vascular disease, diabetic foot wounds requiring amputations, end-stage renal disease on home hemodialysis, hypothyroidism, prostate cancer presented with altered mental status/weakness and hypoxia.  He required BiPAP on presentation with initial improvement but later his mental status deteriorated and patient subsequently got intubated.  COVID-19/influenza/RSV PCR were negative.  Chest x-ray was unremarkable.  Right foot x-ray showed subcutaneous gas within the soft tissues lateral and plantar to the fifth tarsometatarsal joint, reflective of local trauma and/or infection.  CT head showed no acute intracranial abnormality.  CT chest/abdomen/pelvis showed no acute abnormality except for mildly nodular hepatic contour (?  Cirrhosis), small volume ascites.  Nephrology and orthopedics were consulted.  He was extubated on 07/23/2022.  He was transferred to [facility] service from 07/24/2022 onwards.  Assessment & Plan:   Acute respiratory failure with hypoxia -Required intubation on presentation.  Extubated on 07/23/2022. - transferred to [facility] service from 07/24/2022 onwards. -Currently on room air.  Sepsis: Present on admission Right lower extremity cellulitis -Right foot x-ray showed subcutaneous gas within the soft tissues lateral and plantar to the fifth tarsometatarsal joint, reflective of local trauma and/or infection. -Currently on broad-spectrum antibiotics.  Orthopedics/Dr. Lajoyce Corners following.  Follow recommendations.  Follow cultures.  Acute metabolic encephalopathy -Secondary to above.  Mental status much improved.  Monitor mental status.  CT head unremarkable.  End-stage renal disease on home hemodialysis -Nephrology following.  Dialysis as per nephrology  schedule.  Hyponatremia -Managed by dialysis by nephrology  Anemia of chronic disease -From renal failure.  Hemoglobin currently stable.  Monitor intermittently.  Hypomagnesemia -No labs today  Thrombocytopenia -Questionable cause.  No labs today.  Diabetic peripheral neuropathy Peripheral vascular disease Status post amputations (left BKA, right toe/ray amputation) -Lyrica on hold for now.  Continue supportive care.  Pain management. -Follow orthopedics recommendations.  Hypothyroidism -Continue levothyroxine  Hyperlipidemia -Continue statin  Diabetes mellitus type 2 -Continue CBGs with SSI.  Blood sugars on the lower side.  Hypertension CAD -Blood pressure currently stable.  Home antihypertensives on hold.  Outpatient follow-up with cardiology.  GERD -Continue PPI  Physical deconditioning -PT eval    DVT prophylaxis: Subcutaneous heparin Code Status: Full Family Communication: None at bedside Disposition Plan: Status is: Inpatient Remains inpatient appropriate because: Of severity of illness.    Consultants: PCCM/nephrology/orthopedics  Procedures: Intubation/extubation  Antimicrobials:  Anti-infectives (From admission, onward)    Start     Dose/Rate Route Frequency Ordered Stop   07/25/22 2200  vancomycin (VANCOCIN) IVPB 1000 mg/200 mL premix  Status:  Discontinued        1,000 mg 200 mL/hr over 60 Minutes Intravenous Every T-Th-Sa (Hemodialysis) 07/23/22 1147 07/24/22 0736   07/25/22 1200  vancomycin (VANCOCIN) IVPB 1000 mg/200 mL premix        1,000 mg 200 mL/hr over 60 Minutes Intravenous Every T-Th-Sa (Hemodialysis) 07/24/22 0736     07/24/22 1800  ceFEPIme (MAXIPIME) 1 g in sodium chloride 0.9 % 100 mL IVPB  Status:  Discontinued        1 g 200 mL/hr over 30 Minutes Intravenous Every 24 hours 07/22/22 2135 07/23/22 1147   07/24/22 0830  vancomycin (VANCOCIN) IVPB 1000 mg/200 mL premix        1,000 mg 200 mL/hr over 60 Minutes Intravenous   Once 07/24/22 0734 07/24/22 1004  07/23/22 2000  ceFEPIme (MAXIPIME) 1 g in sodium chloride 0.9 % 100 mL IVPB        1 g 200 mL/hr over 30 Minutes Intravenous Every 24 hours 07/23/22 1147     07/23/22 1030  metroNIDAZOLE (FLAGYL) IVPB 500 mg        500 mg 100 mL/hr over 60 Minutes Intravenous Every 12 hours 07/23/22 0939     07/23/22 0909  vancomycin variable dose per unstable renal function (pharmacist dosing)  Status:  Discontinued         Does not apply See admin instructions 07/23/22 0909 07/23/22 1147   07/22/22 1945  vancomycin (VANCOCIN) IVPB 1000 mg/200 mL premix       See Hyperspace for full Linked Orders Report.   1,000 mg 200 mL/hr over 60 Minutes Intravenous  Once 07/22/22 1842 07/22/22 2158   07/22/22 1845  ceFEPIme (MAXIPIME) 2 g in sodium chloride 0.9 % 100 mL IVPB        2 g 200 mL/hr over 30 Minutes Intravenous  Once 07/22/22 1838 07/22/22 1936   07/22/22 1845  metroNIDAZOLE (FLAGYL) IVPB 500 mg        500 mg 100 mL/hr over 60 Minutes Intravenous  Once 07/22/22 1838 07/22/22 1955   07/22/22 1845  vancomycin (VANCOCIN) IVPB 1000 mg/200 mL premix  Status:  Discontinued        1,000 mg 200 mL/hr over 60 Minutes Intravenous  Once 07/22/22 1838 07/22/22 1841   07/22/22 1845  vancomycin (VANCOCIN) IVPB 1000 mg/200 mL premix       See Hyperspace for full Linked Orders Report.   1,000 mg 200 mL/hr over 60 Minutes Intravenous  Once 07/22/22 1842 07/22/22 2158        Subjective: Patient seen and examined at bedside.  No fever, vomiting, seizures or agitation reported.  Does not want to have amputation this week since it is his wife's birthday this weekend.  Breathing better. Objective: Vitals:   07/24/22 0700 07/24/22 0800 07/24/22 0816 07/24/22 0900  BP: (!) 116/48  133/62   Pulse: 64 69 70   Resp: (!) 21 17 17 15   Temp: 98.2 F (36.8 C) 98.2 F (36.8 C) 98.2 F (36.8 C)   TempSrc:   Bladder   SpO2: 97% 100% 100%   Weight:      Height:        Intake/Output  Summary (Last 24 hours) at 07/24/2022 1024 Last data filed at 07/24/2022 0816 Gross per 24 hour  Intake 755.84 ml  Output 1597 ml  Net -841.16 ml   Filed Weights   07/23/22 0232 07/24/22 0212  Weight: 89.4 kg 87.1 kg    Examination:  General exam: Appears calm and comfortable.  On room air.  Looks chronically ill and deconditioned. Respiratory system: Bilateral decreased breath sounds at bases with scattered crackles Cardiovascular system: S1 & S2 heard, Rate controlled Gastrointestinal system: Abdomen is nondistended, soft and nontender. Normal bowel sounds heard. Extremities: Left BKA.  Right foot dressing present Central nervous system: Alert and oriented.  Slow to respond.  No focal neurological deficits. Moving extremities Skin: No rashes, lesions or ulcers Psychiatry: Affect is mostly flat.  Not agitated currently.    Data Reviewed: I have personally reviewed following labs and imaging studies  CBC: Recent Labs  Lab 07/22/22 1832 07/22/22 1903 07/22/22 2145 07/23/22 0023 07/23/22 0337  WBC 7.1  --   --   --  8.4  NEUTROABS 6.0  --   --   --   --  HGB 7.9* 7.8* 7.8* 7.8* 7.8*  HCT 23.6* 23.0* 23.0* 23.0* 23.6*  MCV 88.1  --   --   --  90.4  PLT 88*  --   --   --  76*   Basic Metabolic Panel: Recent Labs  Lab 07/22/22 1832 07/22/22 1903 07/22/22 2145 07/23/22 0023 07/23/22 0337  NA 132* 130* 128* 129* 130*  K 4.4 4.2 3.9 3.9 3.8  CL 89*  --   --   --  91*  CO2 28  --   --   --  26  GLUCOSE 125*  --   --   --  130*  BUN 48*  --   --   --  49*  CREATININE 7.55*  --   --   --  8.12*  CALCIUM 8.7*  --   --   --  7.8*  MG  --   --   --   --  1.6*  PHOS  --   --   --   --  5.0*   GFR: Estimated Creatinine Clearance: 8.5 mL/min (A) (by C-G formula based on SCr of 8.12 mg/dL (H)). Liver Function Tests: Recent Labs  Lab 07/22/22 1832  AST 27  ALT 16  ALKPHOS 169*  BILITOT 1.4*  PROT 6.3*  ALBUMIN 3.9   Recent Labs  Lab 07/22/22 1832  LIPASE 82*    No results for input(s): "AMMONIA" in the last 168 hours. Coagulation Profile: Recent Labs  Lab 07/22/22 1832  INR 1.5*   Cardiac Enzymes: No results for input(s): "CKTOTAL", "CKMB", "CKMBINDEX", "TROPONINI" in the last 168 hours. BNP (last 3 results) No results for input(s): "PROBNP" in the last 8760 hours. HbA1C: No results for input(s): "HGBA1C" in the last 72 hours. CBG: Recent Labs  Lab 07/23/22 1109 07/23/22 1914 07/23/22 2316 07/24/22 0344 07/24/22 0753  GLUCAP 123* 97 129* 104* 89   Lipid Profile: Recent Labs    07/24/22 0538  TRIG 142   Thyroid Function Tests: No results for input(s): "TSH", "T4TOTAL", "FREET4", "T3FREE", "THYROIDAB" in the last 72 hours. Anemia Panel: No results for input(s): "VITAMINB12", "FOLATE", "FERRITIN", "TIBC", "IRON", "RETICCTPCT" in the last 72 hours. Sepsis Labs: Recent Labs  Lab 07/22/22 1835 07/22/22 2019  LATICACIDVEN 0.6 0.6    Recent Results (from the past 240 hour(s))  Resp panel by RT-PCR (RSV, Flu A&B, Covid) Anterior Nasal Swab     Status: None   Collection Time: 07/22/22  6:36 PM   Specimen: Anterior Nasal Swab  Result Value Ref Range Status   SARS Coronavirus 2 by RT PCR NEGATIVE NEGATIVE Final    Comment: (NOTE) SARS-CoV-2 target nucleic acids are NOT DETECTED.  The SARS-CoV-2 RNA is generally detectable in upper respiratory specimens during the acute phase of infection. The lowest concentration of SARS-CoV-2 viral copies this assay can detect is 138 copies/mL. A negative result does not preclude SARS-Cov-2 infection and should not be used as the sole basis for treatment or other patient management decisions. A negative result may occur with  improper specimen collection/handling, submission of specimen other than nasopharyngeal swab, presence of viral mutation(s) within the areas targeted by this assay, and inadequate number of viral copies(<138 copies/mL). A negative result must be combined  with clinical observations, patient history, and epidemiological information. The expected result is Negative.  Fact Sheet for Patients:  BloggerCourse.com  Fact Sheet for Healthcare Providers:  SeriousBroker.it  This test is no t yet approved or cleared by the [place] FDA  and  has been authorized for detection and/or diagnosis of SARS-CoV-2 by FDA under an Emergency Use Authorization (EUA). This EUA will remain  in effect (meaning this test can be used) for the duration of the COVID-19 declaration under Section 564(b)(1) of the Act, 21 U.S.C.section 360bbb-3(b)(1), unless the authorization is terminated  or revoked sooner.       Influenza A by PCR NEGATIVE NEGATIVE Final   Influenza B by PCR NEGATIVE NEGATIVE Final    Comment: (NOTE) The Xpert Xpress SARS-CoV-2/FLU/RSV plus assay is intended as an aid in the diagnosis of influenza from Nasopharyngeal swab specimens and should not be used as a sole basis for treatment. Nasal washings and aspirates are unacceptable for Xpert Xpress SARS-CoV-2/FLU/RSV testing.  Fact Sheet for Patients: BloggerCourse.com  Fact Sheet for Healthcare Providers: SeriousBroker.it  This test is not yet approved or cleared by the [place] FDA and has been authorized for detection and/or diagnosis of SARS-CoV-2 by FDA under an Emergency Use Authorization (EUA). This EUA will remain in effect (meaning this test can be used) for the duration of the COVID-19 declaration under Section 564(b)(1) of the Act, 21 U.S.C. section 360bbb-3(b)(1), unless the authorization is terminated or revoked.     Resp Syncytial Virus by PCR NEGATIVE NEGATIVE Final    Comment: (NOTE) Fact Sheet for Patients: BloggerCourse.com  Fact Sheet for Healthcare Providers: SeriousBroker.it  This test is not yet approved  or cleared by the [place] FDA and has been authorized for detection and/or diagnosis of SARS-CoV-2 by FDA under an Emergency Use Authorization (EUA). This EUA will remain in effect (meaning this test can be used) for the duration of the COVID-19 declaration under Section 564(b)(1) of the Act, 21 U.S.C. section 360bbb-3(b)(1), unless the authorization is terminated or revoked.  Performed at [facility], [address] [place], [place] [address]   MRSA Next Gen by PCR, Nasal     Status: None   Collection Time: 07/23/22  2:30 AM   Specimen: Nasal Mucosa; Nasal Swab  Result Value Ref Range Status   MRSA by PCR Next Gen NOT DETECTED NOT DETECTED Final    Comment: (NOTE) The GeneXpert MRSA Assay (FDA approved for NASAL specimens only), is one component of a comprehensive MRSA colonization surveillance program. It is not intended to diagnose MRSA infection nor to guide or monitor treatment for MRSA infections. Test performance is not FDA approved in patients less than 51 years old. Performed at [facility], [address], [place], [place] [address]          Radiology Studies: DG Chest Port 1 View  Result Date: 07/24/2022 CLINICAL DATA:  Follow-up lung status post extubation. EXAM: PORTABLE CHEST 1 VIEW COMPARISON:  Portable chest April 15 at 11:51 p.m. FINDINGS: 4:45 a.m. interval extubation and NGT removal. Lungs are clear. No pleural effusion is seen. Stable cardiomegaly and CABG change. Central vascular fullness continues to be seen without overt edema. The mediastinum is stable with aortic atherosclerosis. There is partially visible fusion plating in the lower cervical region. IMPRESSION: 1. Interval extubation and NGT removal. 2. Stable cardiomegaly and CABG change.  Clear lungs. 3. Central vascular fullness without overt edema. Electronically Signed   By: Almira Bar M.D.   On: 07/24/2022 07:11   DG Abdomen 1 View  Result Date:  07/23/2022 CLINICAL DATA:  NG tube placement EXAM: ABDOMEN - 1 VIEW COMPARISON:  None Available. FINDINGS: The bowel gas pattern is normal. No radio-opaque calculi or other significant  radiographic abnormality are seen. Enteric tube tip projects over the left upper quadrant. The enteric tube is incompletely visualized on this image. On chest radiograph performed immediately after this abdominal radiograph at 11:44 p.m. the enteric tube tip is in the stomach with side port likely near the GE junction. IMPRESSION: Enteric tube tip projects over the left upper quadrant. The enteric tube is incompletely visualized on this image. On chest radiograph performed immediately after this abdominal radiograph at 11:44 p.m. the enteric tube tip is in the stomach with side port likely near the GE junction. Electronically Signed   By: Minerva Fester M.D.   On: 07/23/2022 00:13   DG Chest Portable 1 View  Result Date: 07/23/2022 CLINICAL DATA:  Post intubation EXAM: PORTABLE CHEST 1 VIEW COMPARISON:  Chest radiographs and CT 07/22/2022. FINDINGS: Endotracheal tube in the intrathoracic trachea approximately 6.9 cm from the carina. Subdiaphragmatic enteric tube. Sternotomy. Cervical spine fusion hardware. Low lung volumes accentuate pulmonary vascularity and cardiomediastinal silhouette. No focal consolidation, pleural effusion, or pneumothorax. No displaced rib fractures. IMPRESSION: Endotracheal tube in the intrathoracic trachea approximately 6.9 cm from the carina. Electronically Signed   By: Minerva Fester M.D.   On: 07/23/2022 00:10   CT CHEST ABDOMEN PELVIS W CONTRAST  Result Date: 07/22/2022 CLINICAL DATA:  Altered mental status, weakness, sepsis EXAM: CT CHEST, ABDOMEN, AND PELVIS WITH CONTRAST TECHNIQUE: Multidetector CT imaging of the chest, abdomen and pelvis was performed following the standard protocol during bolus administration of intravenous contrast. RADIATION DOSE REDUCTION: This exam was performed  according to the departmental dose-optimization program which includes automated exposure control, adjustment of the mA and/or kV according to patient size and/or use of iterative reconstruction technique. CONTRAST:  80mL OMNIPAQUE IOHEXOL 300 MG/ML  SOLN COMPARISON:  CT abdomen/pelvis dated 06/20/2022 FINDINGS: CT CHEST FINDINGS Cardiovascular: The heart is normal in size. No pericardial effusion. No evidence of thoracic aortic aneurysm. Atherosclerotic calcifications of the aortic arch. Severe three-vessel coronary atherosclerosis. Postsurgical changes related to prior CABG. Mediastinum/Nodes: No suspicious mediastinal lymphadenopathy. Visualized thyroid is unremarkable. Lungs/Pleura: Evaluation of the lung parenchyma is constrained by respiratory motion. Within that constraint, there are no suspicious pulmonary nodules. Very mild dependent bilateral lower lobe atelectasis. No focal consolidation. No pleural effusion or pneumothorax. Musculoskeletal: Degenerative changes of the mid/lower thoracic spine. Cervical spine fixation hardware, incompletely visualized. Median sternotomy. Stable sclerotic lesion along the left T10 vertebral body (sagittal image 87), unchanged since 2022. While indeterminate in this clinical setting, stability favors a benign bone island. CT ABDOMEN PELVIS FINDINGS Hepatobiliary: Mildly nodular hepatic contour. Status post cholecystectomy. No intrahepatic or extrahepatic duct dilatation. Pancreas: Within normal limits. Spleen: Within normal limits. Adrenals/Urinary Tract: Stable 3.9 cm right adrenal mass with macroscopic fat, compatible with a benign myelolipoma. Nodular thickening of the left adrenal gland without discrete mass. Bilateral renal cysts, including 3.7 cm posterior right upper pole renal cyst (series 3/image 92). No hydronephrosis. Bladder is thick-walled although underdistended. Stomach/Bowel: Stomach is within normal limits. No evidence of bowel obstruction. Normal appendix  (series 3/image 100). Sigmoid diverticulosis, without evidence of diverticulitis. Vascular/Lymphatic: No evidence of abdominal aortic aneurysm. Atherosclerotic calcifications of the abdominal aorta and branch vessels. No suspicious abdominopelvic lymphadenopathy. Reproductive: Prostatomegaly, with enlargement of the central gland indenting the base of the bladder, suggesting BPH. Additional fiducial markers. Other: Trace perihepatic and perisplenic ascites. Small volume pelvic ascites. This appearance is similar to the prior. Musculoskeletal: Degenerative changes of the lumbar spine. IMPRESSION: No acute cardiopulmonary abnormality. Mildly nodular hepatic contour, correlate  for cirrhosis. Small volume abdominopelvic ascites, chronic. Fiducial markers in the prostate. No findings specific for metastatic disease. Additional ancillary findings as above. Electronically Signed   By: Charline Bills M.D.   On: 07/22/2022 20:53   CT Head Wo Contrast  Result Date: 07/22/2022 CLINICAL DATA:  Altered mental status, delirium EXAM: CT HEAD WITHOUT CONTRAST TECHNIQUE: Contiguous axial images were obtained from the base of the skull through the vertex without intravenous contrast. RADIATION DOSE REDUCTION: This exam was performed according to the departmental dose-optimization program which includes automated exposure control, adjustment of the mA and/or kV according to patient size and/or use of iterative reconstruction technique. COMPARISON:  MRI brain dated 10/16/2021 FINDINGS: Brain: No evidence of acute infarction, hemorrhage, hydrocephalus, extra-axial collection or mass lesion/mass effect. Mild subcortical white matter and periventricular small vessel ischemic changes. Vascular: Intracranial atherosclerosis. Skull: Normal. Negative for fracture or focal lesion. Sinuses/Orbits: The visualized paranasal sinuses are essentially clear. The mastoid air cells are unopacified. Other: None. IMPRESSION: No acute intracranial  abnormality. Mild small vessel ischemic changes. Electronically Signed   By: Charline Bills M.D.   On: 07/22/2022 20:46   DG Foot 2 Views Right  Result Date: 07/22/2022 CLINICAL DATA:  Osteomyelitis EXAM: RIGHT FOOT - 2 VIEW COMPARISON:  01/03/2020 FINDINGS: Two view radiograph of the right foot is limited by obliquity on frontal radiograph. Transmetatarsal amputation of the fourth and fifth digits is again noted. No acute fracture or dislocation. Large superior calcaneal spur. Vascular calcifications are noted. There is subcutaneous gas noted within the soft tissues lateral and plantar to the fifth tarsometatarsal joint, however, no superimposed osseous erosion is identified to suggest osteomyelitis. IMPRESSION: 1. Subcutaneous gas within the soft tissues lateral and plantar to the fifth tarsometatarsal joint, reflective of local trauma and/or infection. No superimposed osseous erosion identified to suggest osteomyelitis. Electronically Signed   By: Helyn Numbers M.D.   On: 07/22/2022 19:25   DG Chest Portable 1 View  Result Date: 07/22/2022 CLINICAL DATA:  Sepsis EXAM: PORTABLE CHEST 1 VIEW COMPARISON:  06/20/2022 FINDINGS: Lungs are clear. No pneumothorax or pleural effusion. Coronary artery bypass grafting has been performed. Cardiac size within normal limits. Pulmonary vascularity is normal. No acute bone abnormality. IMPRESSION: 1. No active disease. Electronically Signed   By: Helyn Numbers M.D.   On: 07/22/2022 19:22        Scheduled Meds:  Chlorhexidine Gluconate Cloth  6 each Topical Daily   Chlorhexidine Gluconate Cloth  6 each Topical Q0600   docusate sodium  100 mg Oral BID   heparin  5,000 Units Subcutaneous Q8H   insulin aspart  0-6 Units Subcutaneous Q4H   [START ON 07/25/2022] levothyroxine  112 mcg Oral Q0600   pantoprazole  40 mg Oral QHS   polyethylene glycol  17 g Oral Daily   rosuvastatin  10 mg Oral Daily   Continuous Infusions:  ceFEPime (MAXIPIME) IV 200 mL/hr  at 07/23/22 2200   metronidazole 500 mg (07/23/22 2250)   [START ON 07/25/2022] vancomycin            Glade Lloyd, MD [facility] 07/24/2022, 10:24 AM

## 2022-07-24 NOTE — Progress Notes (Signed)
Pharmacy Antibiotic Note  [name] is a 72 y.o. male for which pharmacy has been consulted for cefepime and vancomycin dosing for sepsis.  Patient with a history of ESRD on HD (last session 4/16 -- full) HLD, HTN, hypothyroidism, prostate cancer, CABG, chronic rt foot wound s/p amputation 2/2 osteomyelitis. Hx of growing Proteus, Enterococcus, MSSA, Bacteroides in wound cx. Likely for amputation on 4/19 with Dr. Lajoyce Corners.   Plan: Patient did not receive post HD vancomycin, will give 1g dose now. Then continue with 1g post HD, next dose 4/18.  Cefepime 1g q 24h post-HD.  Flagyl 500mg  Q12H.  Trend WBC, Fever, Renal function De-escalate as able.   Height: 5\' 10"  (177.8 cm) Weight: 87.1 kg (192 lb 0.3 oz) IBW/kg (Calculated) : 73  Temp (24hrs), Avg:100.2 F (37.9 C), Min:98.8 F (37.1 C), Max:101.5 F (38.6 C)  Recent Labs  Lab 07/22/22 1832 07/22/22 1835 07/22/22 2019 07/23/22 0337  WBC 7.1  --   --  8.4  CREATININE 7.55*  --   --  8.12*  LATICACIDVEN  --  0.6 0.6  --     Estimated Creatinine Clearance: 8.5 mL/min (A) (by C-G formula based on SCr of 8.12 mg/dL (H)).    Allergies  Allergen Reactions   Morphine     Other reaction(s): Delusions (intolerance)   Mushroom Extract Complex Nausea Only   Microbiology results: Pending  Thank you for allowing pharmacy to be a part of this patient's care.  Estill Batten, PharmD, BCCCP  07/24/2022 7:52 AM ED Clinical Pharmacist -  [phone]

## 2022-07-25 ENCOUNTER — Ambulatory Visit: Payer: Medicare Other | Admitting: Orthopedic Surgery

## 2022-07-25 DIAGNOSIS — J9600 Acute respiratory failure, unspecified whether with hypoxia or hypercapnia: Secondary | ICD-10-CM | POA: Diagnosis not present

## 2022-07-25 DIAGNOSIS — N186 End stage renal disease: Secondary | ICD-10-CM | POA: Diagnosis not present

## 2022-07-25 DIAGNOSIS — L03115 Cellulitis of right lower limb: Secondary | ICD-10-CM | POA: Diagnosis not present

## 2022-07-25 DIAGNOSIS — Z992 Dependence on renal dialysis: Secondary | ICD-10-CM | POA: Diagnosis not present

## 2022-07-25 LAB — BASIC METABOLIC PANEL
Anion gap: 12 (ref 5–15)
BUN: 50 mg/dL — ABNORMAL HIGH (ref 8–23)
CO2: 27 mmol/L (ref 22–32)
Calcium: 7.9 mg/dL — ABNORMAL LOW (ref 8.9–10.3)
Chloride: 96 mmol/L — ABNORMAL LOW (ref 98–111)
Creatinine, Ser: 7.95 mg/dL — ABNORMAL HIGH (ref 0.61–1.24)
GFR, Estimated: 7 mL/min — ABNORMAL LOW (ref >60)
Glucose, Bld: 182 mg/dL — ABNORMAL HIGH (ref 70–99)
Potassium: 3.5 mmol/L (ref 3.5–5.1)
Sodium: 135 mmol/L (ref 135–145)

## 2022-07-25 LAB — BPAM RBC: Unit Type and Rh: 5100

## 2022-07-25 LAB — GLUCOSE, CAPILLARY
Glucose-Capillary: 143 mg/dL — ABNORMAL HIGH (ref 70–99)
Glucose-Capillary: 133 mg/dL — ABNORMAL HIGH (ref 70–99)
Glucose-Capillary: 123 mg/dL — ABNORMAL HIGH (ref 70–99)
Glucose-Capillary: 108 mg/dL — ABNORMAL HIGH (ref 70–99)
Glucose-Capillary: 113 mg/dL — ABNORMAL HIGH (ref 70–99)

## 2022-07-25 LAB — CBC WITH DIFFERENTIAL/PLATELET
Abs Immature Granulocytes: 0.05 10*3/uL (ref 0.00–0.07)
Basophils Absolute: 0 10*3/uL (ref 0.0–0.1)
Basophils Relative: 1 %
Eosinophils Absolute: 0.1 10*3/uL (ref 0.0–0.5)
Eosinophils Relative: 2 %
HCT: 23.5 % — ABNORMAL LOW (ref 39.0–52.0)
Hemoglobin: 7.8 g/dL — ABNORMAL LOW (ref 13.0–17.0)
Immature Granulocytes: 1 %
Lymphocytes Relative: 5 %
Lymphs Abs: 0.3 10*3/uL — ABNORMAL LOW (ref 0.7–4.0)
MCH: 30 pg (ref 26.0–34.0)
MCHC: 33.2 g/dL (ref 30.0–36.0)
MCV: 90.4 fL (ref 80.0–100.0)
Monocytes Absolute: 0.5 10*3/uL (ref 0.1–1.0)
Monocytes Relative: 9 %
Neutro Abs: 4.5 10*3/uL (ref 1.7–7.7)
Neutrophils Relative %: 82 %
Platelets: 91 10*3/uL — ABNORMAL LOW (ref 150–400)
RBC: 2.6 MIL/uL — ABNORMAL LOW (ref 4.22–5.81)
RDW: 15.9 % — ABNORMAL HIGH (ref 11.5–15.5)
WBC: 5.5 10*3/uL (ref 4.0–10.5)
nRBC: 0 % (ref 0.0–0.2)

## 2022-07-25 LAB — HEMOGLOBIN AND HEMATOCRIT, BLOOD
HCT: 28.4 % — ABNORMAL LOW (ref 39.0–52.0)
Hemoglobin: 9.7 g/dL — ABNORMAL LOW (ref 13.0–17.0)

## 2022-07-25 LAB — TYPE AND SCREEN

## 2022-07-25 LAB — MAGNESIUM: Magnesium: 2 mg/dL (ref 1.7–2.4)

## 2022-07-25 MED ORDER — TRANEXAMIC ACID 1000 MG/10ML IV SOLN
2000.0000 mg | INTRAVENOUS | Status: DC
  Filled 2022-07-25: qty 20

## 2022-07-25 MED ORDER — TRAZODONE HCL 50 MG PO TABS
50.0000 mg | ORAL_TABLET | Freq: Every evening | ORAL | Status: DC | PRN
  Administered 2022-07-26 – 2022-07-28 (×3): 50 mg via ORAL
  Filled 2022-07-25 (×3): qty 1

## 2022-07-25 MED ORDER — MELATONIN 5 MG PO TABS
5.0000 mg | ORAL_TABLET | Freq: Every evening | ORAL | Status: DC | PRN
  Administered 2022-07-25: 5 mg via ORAL
  Filled 2022-07-25: qty 1

## 2022-07-25 MED ORDER — DARBEPOETIN ALFA 100 MCG/0.5ML IJ SOSY
100.0000 ug | PREFILLED_SYRINGE | INTRAMUSCULAR | Status: DC
  Administered 2022-07-25: 100 ug via SUBCUTANEOUS
  Filled 2022-07-25: qty 0.5

## 2022-07-25 NOTE — Progress Notes (Signed)
[facility] Progress Note   Subjective:  Seen on HD - 1L UFG, doing ok except + leg pain. No CP/dyspnea. Getting transfusion while on HD.  Objective Vitals:   07/25/22 0930 07/25/22 1000 07/25/22 1015 07/25/22 1030  BP: (!) 165/80 (!) 156/70 (!) 151/57   Pulse: 66 66    Resp: 15 14    Temp:  98.4 F (36.9 C) 98.4 F (36.9 C) 98.4 F (36.9 C)  TempSrc:  Oral Oral Oral  SpO2: 100% 100%    Weight:      Height:       Physical Exam General: Chronically ill appearing man, NAD Heart: RRR; no murmur Lungs: CTA anteriorly Abdomen: soft Extremities: L BKA, RLE with erythema and 1+ edema Dialysis Access: LUE AVF + bruit  Additional Objective Labs: Basic Metabolic Panel: Recent Labs  Lab 07/22/22 1832 07/22/22 1903 07/23/22 0023 07/23/22 0337 07/25/22 0218  NA 132*   < > 129* 130* 135  K 4.4   < > 3.9 3.8 3.5  CL 89*  --   --  91* 96*  CO2 28  --   --  26 27  GLUCOSE 125*  --   --  130* 182*  BUN 48*  --   --  49* 50*  CREATININE 7.55*  --   --  8.12* 7.95*  CALCIUM 8.7*  --   --  7.8* 7.9*  PHOS  --   --   --  5.0*  --    < > = values in this interval not displayed.   Liver Function Tests: Recent Labs  Lab 07/22/22 1832  AST 27  ALT 16  ALKPHOS 169*  BILITOT 1.4*  PROT 6.3*  ALBUMIN 3.9   Recent Labs  Lab 07/22/22 1832  LIPASE 82*   CBC: Recent Labs  Lab 07/22/22 1832 07/22/22 1903 07/23/22 0023 07/23/22 0337 07/25/22 0218  WBC 7.1  --   --  8.4 5.5  NEUTROABS 6.0  --   --   --  4.5  HGB 7.9*   < > 7.8* 7.8* 7.8*  HCT 23.6*   < > 23.0* 23.6* 23.5*  MCV 88.1  --   --  90.4 90.4  PLT 88*  --   --  76* 91*   < > = values in this interval not displayed.   Studies/Results: DG Chest Port 1 View  Result Date: 07/24/2022 CLINICAL DATA:  Follow-up lung status post extubation. EXAM: PORTABLE CHEST 1 VIEW COMPARISON:  Portable chest April 15 at 11:51 p.m. FINDINGS: 4:45 a.m. interval extubation and NGT removal. Lungs are clear. No pleural  effusion is seen. Stable cardiomegaly and CABG change. Central vascular fullness continues to be seen without overt edema. The mediastinum is stable with aortic atherosclerosis. There is partially visible fusion plating in the lower cervical region. IMPRESSION: 1. Interval extubation and NGT removal. 2. Stable cardiomegaly and CABG change.  Clear lungs. 3. Central vascular fullness without overt edema. Electronically Signed   By: Almira Bar M.D.   On: 07/24/2022 07:11    Medications:  ceFEPime (MAXIPIME) IV 1 g (07/24/22 2113)   metronidazole 500 mg (07/24/22 2228)   vancomycin      sodium chloride   Intravenous Once   docusate sodium  100 mg Oral BID   heparin  5,000 Units Subcutaneous Q8H   insulin aspart  0-6 Units Subcutaneous Q4H   levothyroxine  112 mcg Oral Q0600   pantoprazole  40 mg Oral QHS  polyethylene glycol  17 g Oral Daily   rosuvastatin  10 mg Oral Daily    Dialysis Orders: [facility]HD - Sun - Wed - Fri 3:48hr, BFR 425, EDW 88kg, L AVF, no heparin - Venofer 100mg  IV weekly.  Assessment/Plan: 1. RLE cellulitis: Xray with subcutaneous air - ortho following, plan was for R BKA Friday - ?now possibly postponed to next week. On Vanc/Cefepime/Flagyl. 2. Acute Hypoxic Resp Failure: Intubated on arrival, extubated 4/16 3. ESRD: Running him on TTS schedule while here - HD today, 1L UFG. 4. HTN/volume: BP high, variable bed weights - 1L UFG today. 5. Anemia: Hgb 7.8 - getting blood now. Ordered Aranesp to be given today. 6. Secondary hyperparathyroidism:  CorrCa/Phos ok - continue home meds. 7. Nutrition: Adding protein supp for wound healing.  8. T2DM 9. CAD  Ozzie Hoyle, Cordelia Poche 07/25/2022, 10:56 AM  [employer]

## 2022-07-25 NOTE — Progress Notes (Signed)
Received patient in bed to unit.  Alert and oriented.  Informed consent signed and in chart.   TX duration:3.5 h  Patient tolerated well.  Transported back to the room  Alert, without acute distress.  Hand-off given to patient's nurse.   Access used: AVF Access issues: none  Total UF removed: 1 L Medication(s) given: none Post HD VS: 150/68 P 66 R 20 O2 sat 100 % in room air. Post HD weight: 94 kg   [facility]

## 2022-07-25 NOTE — Progress Notes (Signed)
PT Cancellation Note  Patient Details Name: [name] MRN: [id number] DOB: 19-Apr-1950   Cancelled Treatment:    Reason Eval/Treat Not Completed: Patient at procedure or test/unavailable. Pt currently off unit for HD. Will check back as schedule allows to continue with PT POC.    Marylynn Pearson 07/25/2022, 8:41 AM  Conni Slipper, PT, DPT Acute Rehabilitation Services Secure Chat Preferred Office: [phone]

## 2022-07-25 NOTE — Progress Notes (Signed)
PROGRESS NOTE    [name]  NFA:[id number] DOB: 10-03-1950 DOA: 07/22/2022 PCP: Adolph Pollack, FNP   Brief Narrative:  72 year old male with history of hypertension, hyperlipidemia, CAD, diabetes mellitus type 2, peripheral vascular disease, diabetic foot wounds requiring amputations, end-stage renal disease on home hemodialysis, hypothyroidism, prostate cancer presented with altered mental status/weakness and hypoxia.  He required BiPAP on presentation with initial improvement but later his mental status deteriorated and patient subsequently got intubated.  COVID-19/influenza/RSV PCR were negative.  Chest x-ray was unremarkable.  Right foot x-ray showed subcutaneous gas within the soft tissues lateral and plantar to the fifth tarsometatarsal joint, reflective of local trauma and/or infection.  CT head showed no acute intracranial abnormality.  CT chest/abdomen/pelvis showed no acute abnormality except for mildly nodular hepatic contour (?  Cirrhosis), small volume ascites.  Nephrology and orthopedics were consulted.  He was extubated on 07/23/2022.  He was transferred to [facility] service from 07/24/2022 onwards.  Assessment & Plan:   Acute respiratory failure with hypoxia -Required intubation on presentation.  Extubated on 07/23/2022. - transferred to [facility] service from 07/24/2022 onwards. -Currently on room air.  Sepsis: Present on admission Right lower extremity cellulitis -Right foot x-ray showed subcutaneous gas within the soft tissues lateral and plantar to the fifth tarsometatarsal joint, reflective of local trauma and/or infection. -Currently on broad-spectrum antibiotics.  Orthopedics/Dr. Lajoyce Corners following: Dr. Lajoyce Corners recommended right below-knee amputation on Friday.  Cultures negative so far..  Acute metabolic encephalopathy -Secondary to above.  Mental status much improved.  Monitor mental status.  CT head unremarkable.  End-stage renal disease on home hemodialysis -Nephrology following.   Dialysis as per nephrology schedule.  Hyponatremia -Managed by dialysis by nephrology.  Improved.  Anemia of chronic disease -From renal failure.  Hemoglobin currently stable.  Monitor intermittently.  Hypomagnesemia -Improved.  Thrombocytopenia -Questionable cause.  Improved.  Monitor intermittently.  Diabetic peripheral neuropathy Peripheral vascular disease Status post amputations (left BKA, right toe/ray amputation) -Lyrica on hold for now.  Continue supportive care.  Pain management. -Follow orthopedics recommendations.  Hypothyroidism -Continue levothyroxine  Hyperlipidemia -Continue statin  Diabetes mellitus type 2 -Continue CBGs with SSI.  Blood sugars currently stable.  Hypertension CAD -Blood pressure currently stable.  Home antihypertensives on hold.  Resume on discharge.  Outpatient follow-up with cardiology.  GERD -Continue PPI  Physical deconditioning -PT eval    DVT prophylaxis: Subcutaneous heparin Code Status: Full Family Communication: None at bedside Disposition Plan: Status is: Inpatient Remains inpatient appropriate because: Of severity of illness.    Consultants: PCCM/nephrology/orthopedics  Procedures: Intubation/extubation  Antimicrobials:  Anti-infectives (From admission, onward)    Start     Dose/Rate Route Frequency Ordered Stop   07/25/22 2200  vancomycin (VANCOCIN) IVPB 1000 mg/200 mL premix  Status:  Discontinued        1,000 mg 200 mL/hr over 60 Minutes Intravenous Every T-Th-Sa (Hemodialysis) 07/23/22 1147 07/24/22 0736   07/25/22 1200  vancomycin (VANCOCIN) IVPB 1000 mg/200 mL premix        1,000 mg 200 mL/hr over 60 Minutes Intravenous Every T-Th-Sa (Hemodialysis) 07/24/22 0736     07/24/22 1800  ceFEPIme (MAXIPIME) 1 g in sodium chloride 0.9 % 100 mL IVPB  Status:  Discontinued        1 g 200 mL/hr over 30 Minutes Intravenous Every 24 hours 07/22/22 2135 07/23/22 1147   07/24/22 0830  vancomycin (VANCOCIN) IVPB 1000  mg/200 mL premix        1,000 mg 200 mL/hr over 60  Minutes Intravenous  Once 07/24/22 0734 07/24/22 1006   07/23/22 2000  ceFEPIme (MAXIPIME) 1 g in sodium chloride 0.9 % 100 mL IVPB        1 g 200 mL/hr over 30 Minutes Intravenous Every 24 hours 07/23/22 1147     07/23/22 1030  metroNIDAZOLE (FLAGYL) IVPB 500 mg        500 mg 100 mL/hr over 60 Minutes Intravenous Every 12 hours 07/23/22 0939     07/23/22 0909  vancomycin variable dose per unstable renal function (pharmacist dosing)  Status:  Discontinued         Does not apply See admin instructions 07/23/22 0909 07/23/22 1147   07/22/22 1945  vancomycin (VANCOCIN) IVPB 1000 mg/200 mL premix       See Hyperspace for full Linked Orders Report.   1,000 mg 200 mL/hr over 60 Minutes Intravenous  Once 07/22/22 1842 07/22/22 2158   07/22/22 1845  ceFEPIme (MAXIPIME) 2 g in sodium chloride 0.9 % 100 mL IVPB        2 g 200 mL/hr over 30 Minutes Intravenous  Once 07/22/22 1838 07/22/22 1936   07/22/22 1845  metroNIDAZOLE (FLAGYL) IVPB 500 mg        500 mg 100 mL/hr over 60 Minutes Intravenous  Once 07/22/22 1838 07/22/22 1955   07/22/22 1845  vancomycin (VANCOCIN) IVPB 1000 mg/200 mL premix  Status:  Discontinued        1,000 mg 200 mL/hr over 60 Minutes Intravenous  Once 07/22/22 1838 07/22/22 1841   07/22/22 1845  vancomycin (VANCOCIN) IVPB 1000 mg/200 mL premix       See Hyperspace for full Linked Orders Report.   1,000 mg 200 mL/hr over 60 Minutes Intravenous  Once 07/22/22 1842 07/22/22 2158        Subjective: Patient seen and examined at bedside undergoing hemodialysis.  Denies any worsening shortness, chest pain, fever or vomiting.  He would like to proceed with surgical intervention tomorrow.  Objective: Vitals:   07/24/22 1413 07/24/22 2044 07/25/22 0214 07/25/22 0452  BP: (!) 125/55 125/73 (!) 146/58 (!) 146/60  Pulse: 74 68 70 69  Resp: Temp: (!) 97.5 F (36.4 C) 98.4 F (36.9 C) 98.1 F (36.7 C) 97.7 F  (36.5 C)  TempSrc: Oral Oral Oral Oral  SpO2: 100% 99% 99% 99%  Weight:      Height:        Intake/Output Summary (Last 24 hours) at 07/25/2022 0716 Last data filed at 07/25/2022 0200 Gross per 24 hour  Intake 1521.78 ml  Output 10 ml  Net 1511.78 ml    Filed Weights   07/23/22 0232 07/24/22 0212  Weight: 89.4 kg 87.1 kg    Examination:  General: Currently on room air.  No distress.  Chronically ill and deconditioned looking. ENT/neck: No thyromegaly.  JVD is not elevated  respiratory: Decreased breath sounds at bases bilaterally with some crackles; no wheezing  CVS: S1-S2 heard, rate controlled currently Abdominal: Soft, nontender, slightly distended; no organomegaly, bowel sounds are heard Extremities: Left BKA.  Right foot has a dressing CNS: Awake and alert.  Still slow to respond.  No focal neurologic deficit.  Moves extremities Lymph: No obvious lymphadenopathy Skin: No obvious ecchymosis/lesions  psych: Mostly flat affect.  Not agitated.    Data Reviewed: I have personally reviewed following labs and imaging studies  CBC: Recent Labs  Lab 07/22/22 1832 07/22/22 1903 07/22/22 2145 07/23/22 0023 07/23/22 4098 07/25/22 1191  WBC 7.1  --   --   --  8.4 5.5  NEUTROABS 6.0  --   --   --   --  4.5  HGB 7.9* 7.8* 7.8* 7.8* 7.8* 7.8*  HCT 23.6* 23.0* 23.0* 23.0* 23.6* 23.5*  MCV 88.1  --   --   --  90.4 90.4  PLT 88*  --   --   --  76* 91*    Basic Metabolic Panel: Recent Labs  Lab 07/22/22 1832 07/22/22 1903 07/22/22 2145 07/23/22 0023 07/23/22 0337 07/25/22 0218  NA 132* 130* 128* 129* 130* 135  K 4.4 4.2 3.9 3.9 3.8 3.5  CL 89*  --   --   --  91* 96*  CO2 28  --   --   --  26 27  GLUCOSE 125*  --   --   --  130* 182*  BUN 48*  --   --   --  49* 50*  CREATININE 7.55*  --   --   --  8.12* 7.95*  CALCIUM 8.7*  --   --   --  7.8* 7.9*  MG  --   --   --   --  1.6* 2.0  PHOS  --   --   --   --  5.0*  --     GFR: Estimated Creatinine Clearance: 8.7  mL/min (A) (by C-G formula based on SCr of 7.95 mg/dL (H)). Liver Function Tests: Recent Labs  Lab 07/22/22 1832  AST 27  ALT 16  ALKPHOS 169*  BILITOT 1.4*  PROT 6.3*  ALBUMIN 3.9    Recent Labs  Lab 07/22/22 1832  LIPASE 82*    No results for input(s): "AMMONIA" in the last 168 hours. Coagulation Profile: Recent Labs  Lab 07/22/22 1832  INR 1.5*    Cardiac Enzymes: No results for input(s): "CKTOTAL", "CKMB", "CKMBINDEX", "TROPONINI" in the last 168 hours. BNP (last 3 results) No results for input(s): "PROBNP" in the last 8760 hours. HbA1C: No results for input(s): "HGBA1C" in the last 72 hours. CBG: Recent Labs  Lab 07/24/22 1106 07/24/22 1414 07/24/22 2044 07/24/22 2358 07/25/22 0404  GLUCAP 210* 139* 147* 174* 143*    Lipid Profile: Recent Labs    07/24/22 0538  TRIG 142    Thyroid Function Tests: No results for input(s): "TSH", "T4TOTAL", "FREET4", "T3FREE", "THYROIDAB" in the last 72 hours. Anemia Panel: No results for input(s): "VITAMINB12", "FOLATE", "FERRITIN", "TIBC", "IRON", "RETICCTPCT" in the last 72 hours. Sepsis Labs: Recent Labs  Lab 07/22/22 1835 07/22/22 2019  LATICACIDVEN 0.6 0.6     Recent Results (from the past 240 hour(s))  Culture, blood (Routine x 2)     Status: None (Preliminary result)   Collection Time: 07/22/22  6:33 PM   Specimen: BLOOD  Result Value Ref Range Status   Specimen Description   Final    BLOOD BLOOD RIGHT FOREARM Performed at [facility], [address], [place], [place] [address]    Special Requests   Final    Blood Culture adequate volume BOTTLES DRAWN AEROBIC AND ANAEROBIC Performed at [facility], [address] [place], [place] [address]    Culture   Final    NO GROWTH 3 DAYS Performed at [facility], [address], [place], [place] [address]    Report Status PENDING  Incomplete  Culture, blood (Routine x 2)     Status: None (Preliminary  result)   Collection Time: 07/22/22  6:35 PM   Specimen: BLOOD  Result Value Ref Range Status   Specimen Description   Final    BLOOD BLOOD RIGHT ARM Performed at [facility], [address], [place], [place] [address]    Special Requests   Final    Blood Culture adequate volume BOTTLES DRAWN AEROBIC AND ANAEROBIC Performed at [facility], [address], [place], [place] [address]    Culture   Final    NO GROWTH 3 DAYS Performed at [facility], [address], [place], [place] [address]    Report Status PENDING  Incomplete  Resp panel by RT-PCR (RSV, Flu A&B, Covid) Anterior Nasal Swab     Status: None   Collection Time: 07/22/22  6:36 PM   Specimen: Anterior Nasal Swab  Result Value Ref Range Status   SARS Coronavirus 2 by RT PCR NEGATIVE NEGATIVE Final    Comment: (NOTE) SARS-CoV-2 target nucleic acids are NOT DETECTED.  The SARS-CoV-2 RNA is generally detectable in upper respiratory specimens during the acute phase of infection. The lowest concentration of SARS-CoV-2 viral copies this assay can detect is 138 copies/mL. A negative result does not preclude SARS-Cov-2 infection and should not be used as the sole basis for treatment or other patient management decisions. A negative result may occur with  improper specimen collection/handling, submission of specimen other than nasopharyngeal swab, presence of viral mutation(s) within the areas targeted by this assay, and inadequate number of viral copies(<138 copies/mL). A negative result must be combined with clinical observations, patient history, and epidemiological information. The expected result is Negative.  Fact Sheet for Patients:  BloggerCourse.com  Fact Sheet for Healthcare Providers:  SeriousBroker.it  This test is no t yet approved or cleared by the [place] FDA and  has been authorized for detection and/or  diagnosis of SARS-CoV-2 by FDA under an Emergency Use Authorization (EUA). This EUA will remain  in effect (meaning this test can be used) for the duration of the COVID-19 declaration under Section 564(b)(1) of the Act, 21 U.S.C.section 360bbb-3(b)(1), unless the authorization is terminated  or revoked sooner.       Influenza A by PCR NEGATIVE NEGATIVE Final   Influenza B by PCR NEGATIVE NEGATIVE Final    Comment: (NOTE) The Xpert Xpress SARS-CoV-2/FLU/RSV plus assay is intended as an aid in the diagnosis of influenza from Nasopharyngeal swab specimens and should not be used as a sole basis for treatment. Nasal washings and aspirates are unacceptable for Xpert Xpress SARS-CoV-2/FLU/RSV testing.  Fact Sheet for Patients: BloggerCourse.com  Fact Sheet for Healthcare Providers: SeriousBroker.it  This test is not yet approved or cleared by the [place] FDA and has been authorized for detection and/or diagnosis of SARS-CoV-2 by FDA under an Emergency Use Authorization (EUA). This EUA will remain in effect (meaning this test can be used) for the duration of the COVID-19 declaration under Section 564(b)(1) of the Act, 21 U.S.C. section 360bbb-3(b)(1), unless the authorization is terminated or revoked.     Resp Syncytial Virus by PCR NEGATIVE NEGATIVE Final    Comment: (NOTE) Fact Sheet for Patients: BloggerCourse.com  Fact Sheet for Healthcare Providers: SeriousBroker.it  This test is not yet approved or cleared by the [place] FDA and has been authorized for detection and/or diagnosis of SARS-CoV-2 by FDA under an Emergency Use Authorization (EUA). This EUA will remain in effect (meaning this test can be used) for the duration of the COVID-19 declaration under Section 564(b)(1) of the Act, 21  U.S.C. section 360bbb-3(b)(1), unless the authorization is terminated  or revoked.  Performed at [facility], [address], [place], [place] [address]   MRSA Next Gen by PCR, Nasal     Status: None   Collection Time: 07/23/22  2:30 AM   Specimen: Nasal Mucosa; Nasal Swab  Result Value Ref Range Status   MRSA by PCR Next Gen NOT DETECTED NOT DETECTED Final    Comment: (NOTE) The GeneXpert MRSA Assay (FDA approved for NASAL specimens only), is one component of a comprehensive MRSA colonization surveillance program. It is not intended to diagnose MRSA infection nor to guide or monitor treatment for MRSA infections. Test performance is not FDA approved in patients less than 90 years old. Performed at [facility], [address], [place], [place] [address]          Radiology Studies: DG Chest Port 1 View  Result Date: 07/24/2022 CLINICAL DATA:  Follow-up lung status post extubation. EXAM: PORTABLE CHEST 1 VIEW COMPARISON:  Portable chest April 15 at 11:51 p.m. FINDINGS: 4:45 a.m. interval extubation and NGT removal. Lungs are clear. No pleural effusion is seen. Stable cardiomegaly and CABG change. Central vascular fullness continues to be seen without overt edema. The mediastinum is stable with aortic atherosclerosis. There is partially visible fusion plating in the lower cervical region. IMPRESSION: 1. Interval extubation and NGT removal. 2. Stable cardiomegaly and CABG change.  Clear lungs. 3. Central vascular fullness without overt edema. Electronically Signed   By: Almira Bar M.D.   On: 07/24/2022 07:11        Scheduled Meds:  sodium chloride   Intravenous Once   docusate sodium  100 mg Oral BID   heparin  5,000 Units Subcutaneous Q8H   insulin aspart  0-6 Units Subcutaneous Q4H   levothyroxine  112 mcg Oral Q0600   pantoprazole  40 mg Oral QHS   polyethylene glycol  17 g Oral Daily   rosuvastatin  10 mg Oral Daily   Continuous Infusions:  ceFEPime (MAXIPIME) IV 1 g (07/24/22 2113)   metronidazole 500 mg  (07/24/22 2228)   vancomycin            Glade Lloyd, MD [facility] 07/25/2022, 7:16 AM

## 2022-07-25 NOTE — Progress Notes (Signed)
PT Cancellation Note  Patient Details Name: [name] MRN: [id number] DOB: Jan 24, 1951   Cancelled Treatment:    Reason Eval/Treat Not Completed: (P) Other (comment). Plan is for R BKA tomorrow. Discussed this with pt and gave pt an option for PT Evaluation today and re-evaluation s/p BKA or just holding the PT Evaluation until after the BKA surgery. Pt choosing to wait until the BKA for PT Evaluation. Will plan to follow-up then.   Raymond Gurney, PT, DPT Acute Rehabilitation Services  Office: [phone]    Jewel Baize 07/25/2022, 4:04 PM

## 2022-07-25 NOTE — Progress Notes (Signed)
Per Dr. Christell Constant, it is ok to transfuse the 2 Units of PRBC during hemodialysis on Thursday.  Patient made aware of update in his care and is in agreement.  Bernie Covey RN

## 2022-07-26 ENCOUNTER — Encounter (HOSPITAL_COMMUNITY): Payer: Self-pay | Admitting: Pulmonary Disease

## 2022-07-26 ENCOUNTER — Inpatient Hospital Stay (HOSPITAL_COMMUNITY): Payer: Medicare Other | Admitting: Anesthesiology

## 2022-07-26 ENCOUNTER — Other Ambulatory Visit: Payer: Self-pay

## 2022-07-26 ENCOUNTER — Encounter (HOSPITAL_COMMUNITY)
Admission: EM | Disposition: A | Discharge status: Rehabilitation Facility | Payer: Self-pay | Source: Home / Self Care | Attending: Internal Medicine

## 2022-07-26 DIAGNOSIS — L089 Local infection of the skin and subcutaneous tissue, unspecified: Secondary | ICD-10-CM | POA: Diagnosis not present

## 2022-07-26 DIAGNOSIS — L02611 Cutaneous abscess of right foot: Secondary | ICD-10-CM

## 2022-07-26 DIAGNOSIS — N186 End stage renal disease: Secondary | ICD-10-CM

## 2022-07-26 DIAGNOSIS — Z992 Dependence on renal dialysis: Secondary | ICD-10-CM | POA: Diagnosis not present

## 2022-07-26 DIAGNOSIS — Z951 Presence of aortocoronary bypass graft: Secondary | ICD-10-CM

## 2022-07-26 DIAGNOSIS — L03115 Cellulitis of right lower limb: Secondary | ICD-10-CM | POA: Diagnosis not present

## 2022-07-26 DIAGNOSIS — Z87891 Personal history of nicotine dependence: Secondary | ICD-10-CM

## 2022-07-26 DIAGNOSIS — A419 Sepsis, unspecified organism: Secondary | ICD-10-CM | POA: Diagnosis not present

## 2022-07-26 DIAGNOSIS — J9600 Acute respiratory failure, unspecified whether with hypoxia or hypercapnia: Secondary | ICD-10-CM | POA: Diagnosis not present

## 2022-07-26 DIAGNOSIS — E1122 Type 2 diabetes mellitus with diabetic chronic kidney disease: Secondary | ICD-10-CM

## 2022-07-26 DIAGNOSIS — M86271 Subacute osteomyelitis, right ankle and foot: Secondary | ICD-10-CM | POA: Diagnosis not present

## 2022-07-26 DIAGNOSIS — I2510 Atherosclerotic heart disease of native coronary artery without angina pectoris: Secondary | ICD-10-CM | POA: Diagnosis not present

## 2022-07-26 DIAGNOSIS — I120 Hypertensive chronic kidney disease with stage 5 chronic kidney disease or end stage renal disease: Secondary | ICD-10-CM | POA: Diagnosis not present

## 2022-07-26 HISTORY — PX: AMPUTATION: SHX166

## 2022-07-26 LAB — TYPE AND SCREEN
ABO/RH(D): O POS
Unit division: 0

## 2022-07-26 LAB — POCT I-STAT, CHEM 8
BUN: 32 mg/dL — ABNORMAL HIGH (ref 8–23)
Calcium, Ion: 0.99 mmol/L — ABNORMAL LOW (ref 1.15–1.40)
Chloride: 95 mmol/L — ABNORMAL LOW (ref 98–111)
Creatinine, Ser: 6.5 mg/dL — ABNORMAL HIGH (ref 0.61–1.24)
Glucose, Bld: 105 mg/dL — ABNORMAL HIGH (ref 70–99)
HCT: 32 % — ABNORMAL LOW (ref 39.0–52.0)
Hemoglobin: 10.9 g/dL — ABNORMAL LOW (ref 13.0–17.0)
Potassium: 3.7 mmol/L (ref 3.5–5.1)
Sodium: 135 mmol/L (ref 135–145)
TCO2: 29 mmol/L (ref 22–32)

## 2022-07-26 LAB — BPAM RBC
Blood Product Expiration Date: 202405162359
ISSUE DATE / TIME: 202404180834
Unit Type and Rh: 5100

## 2022-07-26 LAB — GLUCOSE, CAPILLARY
Glucose-Capillary: 100 mg/dL — ABNORMAL HIGH (ref 70–99)
Glucose-Capillary: 102 mg/dL — ABNORMAL HIGH (ref 70–99)
Glucose-Capillary: 104 mg/dL — ABNORMAL HIGH (ref 70–99)
Glucose-Capillary: 104 mg/dL — ABNORMAL HIGH (ref 70–99)
Glucose-Capillary: 109 mg/dL — ABNORMAL HIGH (ref 70–99)
Glucose-Capillary: 114 mg/dL — ABNORMAL HIGH (ref 70–99)
Glucose-Capillary: 139 mg/dL — ABNORMAL HIGH (ref 70–99)
Glucose-Capillary: 141 mg/dL — ABNORMAL HIGH (ref 70–99)
Glucose-Capillary: 175 mg/dL — ABNORMAL HIGH (ref 70–99)

## 2022-07-26 SURGERY — AMPUTATION BELOW KNEE
Anesthesia: General | Site: Knee | Laterality: Right

## 2022-07-26 MED ORDER — OXYCODONE HCL 5 MG PO TABS
5.0000 mg | ORAL_TABLET | ORAL | Status: DC | PRN
  Administered 2022-07-26: 5 mg via ORAL
  Administered 2022-07-27: 10 mg via ORAL
  Administered 2022-07-28: 5 mg via ORAL
  Filled 2022-07-26 (×3): qty 2
  Filled 2022-07-26: qty 1

## 2022-07-26 MED ORDER — HYDROMORPHONE HCL 1 MG/ML IJ SOLN: 0.5000 mg | INTRAMUSCULAR | Status: DC | PRN

## 2022-07-26 MED ORDER — DOCUSATE SODIUM 100 MG PO CAPS: 100.0000 mg | ORAL_CAPSULE | Freq: Every day | ORAL | Status: DC

## 2022-07-26 MED ORDER — ZINC SULFATE 220 (50 ZN) MG PO CAPS
220.0000 mg | ORAL_CAPSULE | Freq: Every day | ORAL | Status: DC
  Administered 2022-07-26 – 2022-07-29 (×4): 220 mg via ORAL
  Filled 2022-07-26 (×4): qty 1

## 2022-07-26 MED ORDER — ONDANSETRON HCL 4 MG/2ML IJ SOLN: 4.0000 mg | Freq: Once | INTRAMUSCULAR | Status: DC | PRN

## 2022-07-26 MED ORDER — CEFAZOLIN SODIUM-DEXTROSE 2-4 GM/100ML-% IV SOLN
2.0000 g | Freq: Three times a day (TID) | INTRAVENOUS | Status: AC
  Administered 2022-07-26 (×2): 2 g via INTRAVENOUS
  Filled 2022-07-26 (×2): qty 100

## 2022-07-26 MED ORDER — LIDOCAINE HCL (PF) 1 % IJ SOLN: 5.0000 mL | INTRAMUSCULAR | Status: DC | PRN

## 2022-07-26 MED ORDER — LIDOCAINE 2% (20 MG/ML) 5 ML SYRINGE
INTRAMUSCULAR | Status: DC | PRN
  Administered 2022-07-26: 40 mg via INTRAVENOUS

## 2022-07-26 MED ORDER — METOPROLOL TARTRATE 5 MG/5ML IV SOLN: 2.0000 mg | INTRAVENOUS | Status: DC | PRN

## 2022-07-26 MED ORDER — ONDANSETRON HCL 4 MG/2ML IJ SOLN: 4.0000 mg | Freq: Four times a day (QID) | INTRAMUSCULAR | Status: DC | PRN

## 2022-07-26 MED ORDER — BUPIVACAINE LIPOSOME 1.3 % IJ SUSP
INTRAMUSCULAR | Status: DC | PRN
  Administered 2022-07-26 (×2): 10 mL

## 2022-07-26 MED ORDER — LABETALOL HCL 5 MG/ML IV SOLN: 10.0000 mg | INTRAVENOUS | Status: DC | PRN

## 2022-07-26 MED ORDER — PROPOFOL 10 MG/ML IV BOLUS
INTRAVENOUS | Status: DC | PRN
  Administered 2022-07-26: 100 mg via INTRAVENOUS

## 2022-07-26 MED ORDER — HYDRALAZINE HCL 20 MG/ML IJ SOLN: 5.0000 mg | INTRAMUSCULAR | Status: DC | PRN

## 2022-07-26 MED ORDER — ONDANSETRON HCL 4 MG/2ML IJ SOLN
INTRAMUSCULAR | Status: DC | PRN
  Administered 2022-07-26: 4 mg via INTRAVENOUS

## 2022-07-26 MED ORDER — FENTANYL CITRATE (PF) 100 MCG/2ML IJ SOLN: 25.0000 ug | INTRAMUSCULAR | Status: DC | PRN

## 2022-07-26 MED ORDER — ALBUMIN HUMAN 5 % IV SOLN: INTRAVENOUS | Status: DC | PRN

## 2022-07-26 MED ORDER — POLYETHYLENE GLYCOL 3350 17 G PO PACK: 17.0000 g | PACK | Freq: Every day | ORAL | Status: DC | PRN

## 2022-07-26 MED ORDER — JUVEN PO PACK
1.0000 | PACK | Freq: Two times a day (BID) | ORAL | Status: DC
  Administered 2022-07-26 – 2022-07-29 (×4): 1 via ORAL
  Filled 2022-07-26 (×6): qty 1

## 2022-07-26 MED ORDER — ACETAMINOPHEN 500 MG PO TABS
1000.0000 mg | ORAL_TABLET | Freq: Once | ORAL | Status: AC
  Administered 2022-07-26: 1000 mg via ORAL
  Filled 2022-07-26: qty 2

## 2022-07-26 MED ORDER — FENTANYL CITRATE (PF) 100 MCG/2ML IJ SOLN
INTRAMUSCULAR | Status: DC | PRN
  Administered 2022-07-26 (×2): 25 ug via INTRAVENOUS
  Administered 2022-07-26: 50 ug via INTRAVENOUS

## 2022-07-26 MED ORDER — TRANEXAMIC ACID-NACL 1000-0.7 MG/100ML-% IV SOLN
INTRAVENOUS | Status: DC | PRN
  Administered 2022-07-26: 1000 mg via INTRAVENOUS

## 2022-07-26 MED ORDER — SODIUM CHLORIDE 0.9 % IV SOLN: INTRAVENOUS | Status: DC

## 2022-07-26 MED ORDER — PENTAFLUOROPROP-TETRAFLUOROETH EX AERO: 1.0000 | INHALATION_SPRAY | CUTANEOUS | Status: DC | PRN

## 2022-07-26 MED ORDER — PHENOL 1.4 % MT LIQD: 1.0000 | OROMUCOSAL | Status: DC | PRN

## 2022-07-26 MED ORDER — 0.9 % SODIUM CHLORIDE (POUR BTL) OPTIME
TOPICAL | Status: DC | PRN
  Administered 2022-07-26: 1000 mL

## 2022-07-26 MED ORDER — BUPIVACAINE HCL (PF) 0.5 % IJ SOLN
INTRAMUSCULAR | Status: DC | PRN
  Administered 2022-07-26: 15 mL
  Administered 2022-07-26: 10 mL

## 2022-07-26 MED ORDER — ALUM & MAG HYDROXIDE-SIMETH 200-200-20 MG/5ML PO SUSP: 15.0000 mL | ORAL | Status: DC | PRN

## 2022-07-26 MED ORDER — GUAIFENESIN-DM 100-10 MG/5ML PO SYRP: 15.0000 mL | ORAL_SOLUTION | ORAL | Status: DC | PRN

## 2022-07-26 MED ORDER — TRANEXAMIC ACID-NACL 1000-0.7 MG/100ML-% IV SOLN
INTRAVENOUS | Status: AC
  Filled 2022-07-26: qty 100

## 2022-07-26 MED ORDER — HEPARIN SODIUM (PORCINE) 1000 UNIT/ML DIALYSIS: 1000.0000 [IU] | INTRAMUSCULAR | Status: DC | PRN

## 2022-07-26 MED ORDER — ALTEPLASE 2 MG IJ SOLR: 2.0000 mg | Freq: Once | INTRAMUSCULAR | Status: DC | PRN

## 2022-07-26 MED ORDER — BISACODYL 5 MG PO TBEC: 5.0000 mg | DELAYED_RELEASE_TABLET | Freq: Every day | ORAL | Status: DC | PRN

## 2022-07-26 MED ORDER — GLYCOPYRROLATE 0.2 MG/ML IJ SOLN
INTRAMUSCULAR | Status: DC | PRN
  Administered 2022-07-26: .1 mg via INTRAVENOUS

## 2022-07-26 MED ORDER — MAGNESIUM CITRATE PO SOLN: 1.0000 | Freq: Once | ORAL | Status: DC | PRN

## 2022-07-26 MED ORDER — VITAMIN C 500 MG PO TABS
1000.0000 mg | ORAL_TABLET | Freq: Every day | ORAL | Status: DC
  Administered 2022-07-26 – 2022-07-29 (×4): 1000 mg via ORAL
  Filled 2022-07-26 (×4): qty 2

## 2022-07-26 MED ORDER — CHLORHEXIDINE GLUCONATE CLOTH 2 % EX PADS: 6.0000 | MEDICATED_PAD | Freq: Every day | CUTANEOUS | Status: DC

## 2022-07-26 MED ORDER — LIDOCAINE-PRILOCAINE 2.5-2.5 % EX CREA: 1.0000 | TOPICAL_CREAM | CUTANEOUS | Status: DC | PRN

## 2022-07-26 MED ORDER — ACETAMINOPHEN 325 MG PO TABS: 325.0000 mg | ORAL_TABLET | Freq: Four times a day (QID) | ORAL | Status: DC | PRN

## 2022-07-26 MED ORDER — OXYCODONE HCL 5 MG PO TABS
10.0000 mg | ORAL_TABLET | ORAL | Status: DC | PRN
  Administered 2022-07-27: 10 mg via ORAL
  Administered 2022-07-27: 15 mg via ORAL
  Filled 2022-07-26: qty 3

## 2022-07-26 MED ORDER — OXYCODONE HCL 5 MG PO TABS: 5.0000 mg | ORAL_TABLET | Freq: Once | ORAL | Status: DC | PRN

## 2022-07-26 MED ORDER — EPHEDRINE SULFATE-NACL 50-0.9 MG/10ML-% IV SOSY
PREFILLED_SYRINGE | INTRAVENOUS | Status: DC | PRN
  Administered 2022-07-26 (×4): 5 mg via INTRAVENOUS

## 2022-07-26 MED ORDER — FENTANYL CITRATE (PF) 100 MCG/2ML IJ SOLN
INTRAMUSCULAR | Status: AC
  Filled 2022-07-26: qty 2

## 2022-07-26 MED ORDER — MAGNESIUM SULFATE 2 GM/50ML IV SOLN: 2.0000 g | Freq: Every day | INTRAVENOUS | Status: DC | PRN

## 2022-07-26 MED ORDER — PANTOPRAZOLE SODIUM 40 MG PO TBEC: 40.0000 mg | DELAYED_RELEASE_TABLET | Freq: Every day | ORAL | Status: DC

## 2022-07-26 MED ORDER — OXYCODONE HCL 5 MG/5ML PO SOLN: 5.0000 mg | Freq: Once | ORAL | Status: DC | PRN

## 2022-07-26 MED ORDER — POTASSIUM CHLORIDE CRYS ER 20 MEQ PO TBCR: 20.0000 meq | EXTENDED_RELEASE_TABLET | Freq: Every day | ORAL | Status: DC | PRN

## 2022-07-26 MED ORDER — SODIUM CHLORIDE 0.9 % IV SOLN: INTRAVENOUS | Status: DC | PRN

## 2022-07-26 MED ORDER — MIDAZOLAM HCL 2 MG/2ML IJ SOLN
INTRAMUSCULAR | Status: AC
  Filled 2022-07-26: qty 2

## 2022-07-26 SURGICAL SUPPLY — 44 items
BAG COUNTER SPONGE SURGICOUNT (BAG) IMPLANT
BAG SPNG CNTER NS LX DISP (BAG)
BIT DRILL 3.2XOCPTL (BIT) ×1 IMPLANT
BIT DRL 3.2XOCPTL (BIT) ×1
BLADE SAW RECIP 87.9 MT (BLADE) ×1 IMPLANT
BLADE SURG 21 STRL SS (BLADE) ×1 IMPLANT
BNDG CMPR 5X6 CHSV STRCH STRL (GAUZE/BANDAGES/DRESSINGS)
BNDG COHESIVE 6X5 TAN ST LF (GAUZE/BANDAGES/DRESSINGS) IMPLANT
CANISTER WOUND CARE 500ML ATS (WOUND CARE) ×1 IMPLANT
CANISTER WOUNDNEG PRESSURE 500 (CANNISTER) IMPLANT
COVER SURGICAL LIGHT HANDLE (MISCELLANEOUS) ×1 IMPLANT
CUFF TOURN SGL QUICK 34 (TOURNIQUET CUFF) ×1
CUFF TRNQT CYL 34X4.125X (TOURNIQUET CUFF) ×1 IMPLANT
DRAPE DERMATAC (DRAPES) IMPLANT
DRAPE INCISE IOBAN 66X45 STRL (DRAPES) ×1 IMPLANT
DRAPE U-SHAPE 47X51 STRL (DRAPES) ×1 IMPLANT
DRESSING PREVENA PLUS CUSTOM (GAUZE/BANDAGES/DRESSINGS) ×1 IMPLANT
DRILL BIT (BIT) ×1
DRSG PREVENA PLUS CUSTOM (GAUZE/BANDAGES/DRESSINGS) ×1
DURAPREP 26ML APPLICATOR (WOUND CARE) ×1 IMPLANT
ELECT REM PT RETURN 9FT ADLT (ELECTROSURGICAL) ×1
ELECTRODE REM PT RTRN 9FT ADLT (ELECTROSURGICAL) ×1 IMPLANT
GLOVE BIOGEL PI IND STRL 9 (GLOVE) ×1 IMPLANT
GLOVE SURG ORTHO 9.0 STRL STRW (GLOVE) ×1 IMPLANT
GOWN STRL REUS W/ TWL XL LVL3 (GOWN DISPOSABLE) ×2 IMPLANT
GOWN STRL REUS W/TWL XL LVL3 (GOWN DISPOSABLE) ×2
GRAFT SKIN WND MICRO 38 (Tissue) IMPLANT
KIT BASIN OR (CUSTOM PROCEDURE TRAY) ×1 IMPLANT
KIT TURNOVER KIT B (KITS) ×1 IMPLANT
MANIFOLD NEPTUNE II (INSTRUMENTS) ×1 IMPLANT
NS IRRIG 1000ML POUR BTL (IV SOLUTION) ×1 IMPLANT
PACK ORTHO EXTREMITY (CUSTOM PROCEDURE TRAY) ×1 IMPLANT
PAD ARMBOARD 7.5X6 YLW CONV (MISCELLANEOUS) ×1 IMPLANT
PREVENA RESTOR ARTHOFORM 46X30 (CANNISTER) ×1 IMPLANT
SPONGE T-LAP 18X18 ~~LOC~~+RFID (SPONGE) IMPLANT
STAPLER VISISTAT 35W (STAPLE) IMPLANT
STOCKINETTE IMPERVIOUS LG (DRAPES) ×1 IMPLANT
SUT ETHILON 2 0 PSLX (SUTURE) IMPLANT
SUT SILK 2 0 (SUTURE) ×1
SUT SILK 2-0 18XBRD TIE 12 (SUTURE) ×1 IMPLANT
SUT VIC AB 1 CTX 27 (SUTURE) ×2 IMPLANT
TOWEL GREEN STERILE (TOWEL DISPOSABLE) ×1 IMPLANT
TUBE CONNECTING 12X1/4 (SUCTIONS) ×1 IMPLANT
YANKAUER SUCT BULB TIP NO VENT (SUCTIONS) ×1 IMPLANT

## 2022-07-26 NOTE — Interval H&P Note (Signed)
History and Physical Interval Note:  07/26/2022 6:47 AM  [name]  has presented today for surgery, with the diagnosis of Abscess Right Foot.  The various methods of treatment have been discussed with the patient and family. After consideration of risks, benefits and other options for treatment, the patient has consented to  Procedure(s): RIGHT BELOW KNEE AMPUTATION (Right) as a surgical intervention.  The patient's history has been reviewed, patient examined, no change in status, stable for surgery.  I have reviewed the patient's chart and labs.  Questions were answered to the patient's satisfaction.     Nadara Mustard

## 2022-07-26 NOTE — Progress Notes (Signed)
PROGRESS NOTE    [name]  ZOX:[id number] DOB: July 30, 1950 DOA: 07/22/2022 PCP: Adolph Pollack, FNP   Brief Narrative:  72 year old male with history of hypertension, hyperlipidemia, CAD, diabetes mellitus type 2, peripheral vascular disease, diabetic foot wounds requiring amputations, end-stage renal disease on home hemodialysis, hypothyroidism, prostate cancer presented with altered mental status/weakness and hypoxia.  He required BiPAP on presentation with initial improvement but later his mental status deteriorated and patient subsequently got intubated.  COVID-19/influenza/RSV PCR were negative.  Chest x-ray was unremarkable.  Right foot x-ray showed subcutaneous gas within the soft tissues lateral and plantar to the fifth tarsometatarsal joint, reflective of local trauma and/or infection.  CT head showed no acute intracranial abnormality.  CT chest/abdomen/pelvis showed no acute abnormality except for mildly nodular hepatic contour (?  Cirrhosis), small volume ascites.  Nephrology and orthopedics were consulted.  He was extubated on 07/23/2022.  He was transferred to [facility] service from 07/24/2022 onwards.  Assessment & Plan:   Acute respiratory failure with hypoxia -Required intubation on presentation.  Extubated on 07/23/2022. - transferred to [facility] service from 07/24/2022 onwards. -Currently on room air.  Sepsis: Present on admission Right lower extremity cellulitis -Right foot x-ray showed subcutaneous gas within the soft tissues lateral and plantar to the fifth tarsometatarsal joint, reflective of local trauma and/or infection. -Currently on broad-spectrum antibiotics.   -Orthopedics/Dr. Lajoyce Corners following: Planning for right below-knee amputation possibly today.  Cultures negative so far..  Acute metabolic encephalopathy -Secondary to above.  Mental status much improved.  Monitor mental status.  CT head unremarkable.  End-stage renal disease on home hemodialysis -Nephrology following.   Dialysis as per nephrology schedule.  Hyponatremia -Managed by dialysis by nephrology.  Improved.  Monitor intermittently.  Anemia of chronic disease -From renal failure.  Hemoglobin currently stable.  Monitor intermittently.  Hypomagnesemia -Improved.  Thrombocytopenia -Questionable cause.  Improved.  Monitor intermittently.  Diabetic peripheral neuropathy Peripheral vascular disease Status post amputations (left BKA, right toe/ray amputation) -Lyrica on hold for now.  Continue supportive care.  Pain management. -Follow orthopedics recommendations.  Hypothyroidism -Continue levothyroxine  Hyperlipidemia -Continue statin  Diabetes mellitus type 2 -Continue CBGs with SSI.  Blood sugars currently stable.  Hypertension CAD -Blood pressure currently stable.  Home antihypertensives on hold.  Might have to resume after surgery if blood pressure remains elevated.  Outpatient follow-up with cardiology.  GERD -Continue PPI  Physical deconditioning -PT eval    DVT prophylaxis: Subcutaneous heparin Code Status: Full Family Communication: None at bedside Disposition Plan: Status is: Inpatient Remains inpatient appropriate because: Of severity of illness.    Consultants: PCCM/nephrology/orthopedics  Procedures: Intubation/extubation  Antimicrobials:  Anti-infectives (From admission, onward)    Start     Dose/Rate Route Frequency Ordered Stop   07/25/22 2200  vancomycin (VANCOCIN) IVPB 1000 mg/200 mL premix  Status:  Discontinued        1,000 mg 200 mL/hr over 60 Minutes Intravenous Every T-Th-Sa (Hemodialysis) 07/23/22 1147 07/24/22 0736   07/25/22 1200  vancomycin (VANCOCIN) IVPB 1000 mg/200 mL premix        1,000 mg 200 mL/hr over 60 Minutes Intravenous Every T-Th-Sa (Hemodialysis) 07/24/22 0736     07/24/22 1800  ceFEPIme (MAXIPIME) 1 g in sodium chloride 0.9 % 100 mL IVPB  Status:  Discontinued        1 g 200 mL/hr over 30 Minutes Intravenous Every 24 hours  07/22/22 2135 07/23/22 1147   07/24/22 0830  vancomycin (VANCOCIN) IVPB 1000 mg/200 mL premix  1,000 mg 200 mL/hr over 60 Minutes Intravenous  Once 07/24/22 0734 07/24/22 1006   07/23/22 2000  ceFEPIme (MAXIPIME) 1 g in sodium chloride 0.9 % 100 mL IVPB        1 g 200 mL/hr over 30 Minutes Intravenous Every 24 hours 07/23/22 1147     07/23/22 1030  metroNIDAZOLE (FLAGYL) IVPB 500 mg        500 mg 100 mL/hr over 60 Minutes Intravenous Every 12 hours 07/23/22 0939     07/23/22 0909  vancomycin variable dose per unstable renal function (pharmacist dosing)  Status:  Discontinued         Does not apply See admin instructions 07/23/22 0909 07/23/22 1147   07/22/22 1945  vancomycin (VANCOCIN) IVPB 1000 mg/200 mL premix       See Hyperspace for full Linked Orders Report.   1,000 mg 200 mL/hr over 60 Minutes Intravenous  Once 07/22/22 1842 07/22/22 2158   07/22/22 1845  ceFEPIme (MAXIPIME) 2 g in sodium chloride 0.9 % 100 mL IVPB        2 g 200 mL/hr over 30 Minutes Intravenous  Once 07/22/22 1838 07/22/22 1936   07/22/22 1845  metroNIDAZOLE (FLAGYL) IVPB 500 mg        500 mg 100 mL/hr over 60 Minutes Intravenous  Once 07/22/22 1838 07/22/22 1955   07/22/22 1845  vancomycin (VANCOCIN) IVPB 1000 mg/200 mL premix  Status:  Discontinued        1,000 mg 200 mL/hr over 60 Minutes Intravenous  Once 07/22/22 1838 07/22/22 1841   07/22/22 1845  vancomycin (VANCOCIN) IVPB 1000 mg/200 mL premix       See Hyperspace for full Linked Orders Report.   1,000 mg 200 mL/hr over 60 Minutes Intravenous  Once 07/22/22 1842 07/22/22 2158        Subjective: Patient seen and examined at bedside.  No fever, vomiting, worsening shortness of breath or chest pain reported.  Objective: Vitals:   07/25/22 1745 07/25/22 2047 07/25/22 2049 07/26/22 0500  BP: (!) 153/74 (!) 161/122 (!) 157/83   Pulse: 67 67 73   Resp: 18 18    Temp: (!) 97.4 F (36.3 C) 98.3 F (36.8 C)    TempSrc: Oral Oral    SpO2: 100%  100% 100%   Weight:    91.5 kg  Height:        Intake/Output Summary (Last 24 hours) at 07/26/2022 0732 Last data filed at 07/26/2022 0015 Gross per 24 hour  Intake 2002.91 ml  Output 1300 ml  Net 702.91 ml    Filed Weights   07/25/22 1221 07/25/22 1235 07/26/22 0500  Weight: 95.1 kg 94.1 kg 91.5 kg    Examination:  General: No acute distress.  Still on room air.  Chronically ill and deconditioned looking. ENT/neck: No palpable neck masses or JVD elevation noted respiratory: Bilateral decreased breath sounds at bases with scattered crackles  CVS: Rate mostly controlled; S1 and S2 are heard Abdominal: Soft, nontender, distended mildly; no organomegaly, bowel sounds are heard normally Extremities: Right foot dressing present; left BKA  CNS: Alert but still slow to respond.  No focal neurologic deficit.  Able to move extremities  lymph: No palpable lymphadenopathy noted Skin: No obvious petechiae/lesions psych: Currently not agitated.  Affect is mostly flat.  Looks slightly anxious.   Data Reviewed: I have personally reviewed following labs and imaging studies  CBC: Recent Labs  Lab 07/22/22 1832 07/22/22 1903 07/22/22 2145 07/23/22 0023 07/23/22 1191  07/25/22 0218 07/25/22 1359  WBC 7.1  --   --   --  8.4 5.5  --   NEUTROABS 6.0  --   --   --   --  4.5  --   HGB 7.9*   < > 7.8* 7.8* 7.8* 7.8* 9.7*  HCT 23.6*   < > 23.0* 23.0* 23.6* 23.5* 28.4*  MCV 88.1  --   --   --  90.4 90.4  --   PLT 88*  --   --   --  76* 91*  --    < > = values in this interval not displayed.    Basic Metabolic Panel: Recent Labs  Lab 07/22/22 1832 07/22/22 1903 07/22/22 2145 07/23/22 0023 07/23/22 0337 07/25/22 0218  NA 132* 130* 128* 129* 130* 135  K 4.4 4.2 3.9 3.9 3.8 3.5  CL 89*  --   --   --  91* 96*  CO2 28  --   --   --  26 27  GLUCOSE 125*  --   --   --  130* 182*  BUN 48*  --   --   --  49* 50*  CREATININE 7.55*  --   --   --  8.12* 7.95*  CALCIUM 8.7*  --   --   --   7.8* 7.9*  MG  --   --   --   --  1.6* 2.0  PHOS  --   --   --   --  5.0*  --     GFR: Estimated Creatinine Clearance: 9.6 mL/min (A) (by C-G formula based on SCr of 7.95 mg/dL (H)). Liver Function Tests: Recent Labs  Lab 07/22/22 1832  AST 27  ALT 16  ALKPHOS 169*  BILITOT 1.4*  PROT 6.3*  ALBUMIN 3.9    Recent Labs  Lab 07/22/22 1832  LIPASE 82*    No results for input(s): "AMMONIA" in the last 168 hours. Coagulation Profile: Recent Labs  Lab 07/22/22 1832  INR 1.5*    Cardiac Enzymes: No results for input(s): "CKTOTAL", "CKMB", "CKMBINDEX", "TROPONINI" in the last 168 hours. BNP (last 3 results) No results for input(s): "PROBNP" in the last 8760 hours. HbA1C: No results for input(s): "HGBA1C" in the last 72 hours. CBG: Recent Labs  Lab 07/25/22 1252 07/25/22 1557 07/25/22 2051 07/26/22 0045 07/26/22 0530  GLUCAP 123* 108* 113* 114* 104*    Lipid Profile: Recent Labs    07/24/22 0538  TRIG 142    Thyroid Function Tests: No results for input(s): "TSH", "T4TOTAL", "FREET4", "T3FREE", "THYROIDAB" in the last 72 hours. Anemia Panel: No results for input(s): "VITAMINB12", "FOLATE", "FERRITIN", "TIBC", "IRON", "RETICCTPCT" in the last 72 hours. Sepsis Labs: Recent Labs  Lab 07/22/22 1835 07/22/22 2019  LATICACIDVEN 0.6 0.6     Recent Results (from the past 240 hour(s))  Culture, blood (Routine x 2)     Status: None (Preliminary result)   Collection Time: 07/22/22  6:33 PM   Specimen: BLOOD  Result Value Ref Range Status   Specimen Description   Final    BLOOD BLOOD RIGHT FOREARM Performed at [facility], [address], [place], [place] [address]    Special Requests   Final    Blood Culture adequate volume BOTTLES DRAWN AEROBIC AND ANAEROBIC Performed at [facility], [address] [place], [place] [address]    Culture   Final    NO GROWTH 4 DAYS Performed at [facility], [address] [place], [place] [address]    Report Status PENDING  Incomplete  Culture, blood (Routine x 2)     Status: None (Preliminary result)   Collection Time: 07/22/22  6:35 PM   Specimen: BLOOD  Result Value Ref Range Status   Specimen Description   Final    BLOOD BLOOD RIGHT ARM Performed at [facility], [address] [place], [place] [address]    Special Requests   Final    Blood Culture adequate volume BOTTLES DRAWN AEROBIC AND ANAEROBIC Performed at [facility], [address], [place], [place] [address]    Culture   Final    NO GROWTH 4 DAYS Performed at [facility], [address], [place], [place] [address]    Report Status PENDING  Incomplete  Resp panel by RT-PCR (RSV, Flu A&B, Covid) Anterior Nasal Swab     Status: None   Collection Time: 07/22/22  6:36 PM   Specimen: Anterior Nasal Swab  Result Value Ref Range Status   SARS Coronavirus 2 by RT PCR NEGATIVE NEGATIVE Final    Comment: (NOTE) SARS-CoV-2 target nucleic acids are NOT DETECTED.  The SARS-CoV-2 RNA is generally detectable in upper respiratory specimens during the acute phase of infection. The lowest concentration of SARS-CoV-2 viral copies this assay can detect is 138 copies/mL. A negative result does not preclude SARS-Cov-2 infection and should not be used as the sole basis for treatment or other patient management decisions. A negative result may occur with  improper specimen collection/handling, submission of specimen other than nasopharyngeal swab, presence of viral mutation(s) within the areas targeted by this assay, and inadequate number of viral copies(<138 copies/mL). A negative result must be combined with clinical observations, patient history, and epidemiological information. The expected result is Negative.  Fact Sheet for Patients:  BloggerCourse.com  Fact Sheet for Healthcare Providers:   SeriousBroker.it  This test is no t yet approved or cleared by the [place] FDA and  has been authorized for detection and/or diagnosis of SARS-CoV-2 by FDA under an Emergency Use Authorization (EUA). This EUA will remain  in effect (meaning this test can be used) for the duration of the COVID-19 declaration under Section 564(b)(1) of the Act, 21 U.S.C.section 360bbb-3(b)(1), unless the authorization is terminated  or revoked sooner.       Influenza A by PCR NEGATIVE NEGATIVE Final   Influenza B by PCR NEGATIVE NEGATIVE Final    Comment: (NOTE) The Xpert Xpress SARS-CoV-2/FLU/RSV plus assay is intended as an aid in the diagnosis of influenza from Nasopharyngeal swab specimens and should not be used as a sole basis for treatment. Nasal washings and aspirates are unacceptable for Xpert Xpress SARS-CoV-2/FLU/RSV testing.  Fact Sheet for Patients: BloggerCourse.com  Fact Sheet for Healthcare Providers: SeriousBroker.it  This test is not yet approved or cleared by the [place] FDA and has been authorized for detection and/or diagnosis of SARS-CoV-2 by FDA under an Emergency Use Authorization (EUA). This EUA will remain in effect (meaning this test can be used) for the duration of the COVID-19 declaration under Section 564(b)(1) of the Act, 21 U.S.C. section 360bbb-3(b)(1), unless the authorization is terminated or revoked.     Resp Syncytial Virus by PCR NEGATIVE NEGATIVE Final    Comment: (NOTE) Fact Sheet for Patients: BloggerCourse.com  Fact Sheet for Healthcare Providers: SeriousBroker.it  This test is not yet approved or cleared by the [place] FDA and has been authorized for detection and/or  diagnosis of SARS-CoV-2 by FDA under an Emergency Use Authorization (EUA). This EUA will remain in effect (meaning this test can be used) for  the duration of the COVID-19 declaration under Section 564(b)(1) of the Act, 21 U.S.C. section 360bbb-3(b)(1), unless the authorization is terminated or revoked.  Performed at [facility], [address] [place], [place] [address]   MRSA Next Gen by PCR, Nasal     Status: None   Collection Time: 07/23/22  2:30 AM   Specimen: Nasal Mucosa; Nasal Swab  Result Value Ref Range Status   MRSA by PCR Next Gen NOT DETECTED NOT DETECTED Final    Comment: (NOTE) The GeneXpert MRSA Assay (FDA approved for NASAL specimens only), is one component of a comprehensive MRSA colonization surveillance program. It is not intended to diagnose MRSA infection nor to guide or monitor treatment for MRSA infections. Test performance is not FDA approved in patients less than 75 years old. Performed at [facility], [address], [place], [place] [address]          Radiology Studies: No results found.      Scheduled Meds:  sodium chloride   Intravenous Once   darbepoetin (ARANESP) injection - DIALYSIS  100 mcg Subcutaneous Q Thu-1800   docusate sodium  100 mg Oral BID   heparin  5,000 Units Subcutaneous Q8H   insulin aspart  0-6 Units Subcutaneous Q4H   levothyroxine  112 mcg Oral Q0600   pantoprazole  40 mg Oral QHS   polyethylene glycol  17 g Oral Daily   rosuvastatin  10 mg Oral Daily   tranexamic acid (CYKLOKAPRON) 2,000 mg in sodium chloride 0.9 % 50 mL Topical Application  2,000 mg Topical To OR   Continuous Infusions:  ceFEPime (MAXIPIME) IV Stopped (07/25/22 2200)   metronidazole Stopped (07/26/22 0015)   vancomycin 1,000 mg (07/25/22 1345)          Glade Lloyd, MD [facility] 07/26/2022, 7:32 AM

## 2022-07-26 NOTE — Transfer of Care (Signed)
Immediate Anesthesia Transfer of Care Note  Patient: [name]  Procedure(s) Performed: RIGHT BELOW KNEE AMPUTATION (Right: Knee)  Patient Location: PACU  Anesthesia Type:GA combined with regional for post-op pain  Level of Consciousness: drowsy  Airway & Oxygen Therapy: Patient Spontanous Breathing and Patient connected to face mask oxygen  Post-op Assessment: Report given to RN and Post -op Vital signs reviewed and stable  Post vital signs: Reviewed and stable  Last Vitals:  Vitals Value Taken Time  BP 135/65 07/26/22 1130  Temp 36.6 C 07/26/22 1124  Pulse 71 07/26/22 1135  Resp 16 07/26/22 1135  SpO2 92 % 07/26/22 1135  Vitals shown include unvalidated device data.  Last Pain:  Vitals:   07/26/22 0921  TempSrc:   PainSc: 0-No pain      Patients Stated Pain Goal: 0 (07/26/22 0144)  Complications: No notable events documented.

## 2022-07-26 NOTE — Anesthesia Preprocedure Evaluation (Addendum)
Anesthesia Evaluation  Patient identified by MRN, date of birth, ID band Patient awake    Reviewed: Allergy & Precautions, NPO status , Patient's Chart, lab work & pertinent test results, reviewed documented beta blocker date and time   History of Anesthesia Complications (+) PONV and history of anesthetic complications  Airway Mallampati: III  TM Distance: >3 FB Neck ROM: Full    Dental  (+) Dental Advisory Given   Pulmonary former smoker   Pulmonary exam normal        Cardiovascular hypertension, Pt. on home beta blockers and Pt. on medications + CAD, + CABG and + Peripheral Vascular Disease  Normal cardiovascular exam+ dysrhythmias Atrial Fibrillation    '21 Myoperfusion - Non-diagnostic ECG stress due to pharmacologic stress testing. Resting EKG demonstrated atrial flutter. Non-specific Twave abnormalities. Peak EKG revealed no significant ST-T change from baseline abnormality. Myocardial perfusion is normal. Mildly enlarged left ventricle. in both rest and stress. LV stress volume 170 ml. TID is normal. No stress lung uptake. Overall LV systolic function is low normal without regional wall motion  abnormalities. Stress LV EF: 51%.     Neuro/Psych  Neuromuscular disease  negative psych ROS   GI/Hepatic Neg liver ROS,GERD  Controlled and Medicated,,  Endo/Other  diabetes, Type 2Hypothyroidism   On GLP-1a   Renal/GU ESRF and DialysisRenal disease    Prostate cancer     Musculoskeletal  (+) Arthritis ,    Abdominal   Peds  Hematology  (+) Blood dyscrasia, anemia  INR 1.5 Plt 91k    Anesthesia Other Findings    Reproductive/Obstetrics                             Anesthesia Physical Anesthesia Plan  ASA: 3  Anesthesia Plan: General   Post-op Pain Management: Tylenol PO (pre-op)* and Regional block*   Induction: Intravenous, Rapid sequence and Cricoid pressure planned  PONV  Risk Score and Plan: 3 and Treatment may vary due to age or medical condition, Ondansetron and Dexamethasone  Airway Management Planned: LMA  Additional Equipment: None  Intra-op Plan:   Post-operative Plan: Extubation in OR  Informed Consent: I have reviewed the patients History and Physical, chart, labs and discussed the procedure including the risks, benefits and alternatives for the proposed anesthesia with the patient or authorized representative who has indicated his/her understanding and acceptance.     Dental advisory given  Plan Discussed with: CRNA, Anesthesiologist and Surgeon  Anesthesia Plan Comments:        Anesthesia Quick Evaluation

## 2022-07-26 NOTE — Progress Notes (Signed)
[facility] Progress Note   Subjective:    Tolerated yesterday's HD with net UF 1L. S/p R BKA 2nd abscess of R foot. Seen and examined patient at bedside. Wife also at bedside. Patient currently working with PT. No acute issues and reported tolerating today's procedure well. Plan for HD 4/20.  Objective Vitals:   07/26/22 1130 07/26/22 1145 07/26/22 1200 07/26/22 1225  BP: 135/65 134/71 (!) 149/60 136/67  Pulse: 73 75 72 73  Resp: 16 12 (!) 21 18  Temp:   98 F (36.7 C) 97.6 F (36.4 C)  TempSrc:    Oral  SpO2: 93% 94% 95% 97%  Weight:      Height:       Physical Exam General: Chronically ill appearing man, NAD Heart: RRR; no murmur Lungs: CTA anteriorly Abdomen: soft Extremities: L BKA, RLE with erythema and 1+ edema Dialysis Access: LUE AVF + bruit  Filed Weights   07/25/22 1221 07/25/22 1235 07/26/22 0500  Weight: 95.1 kg 94.1 kg 91.5 kg    Intake/Output Summary (Last 24 hours) at 07/26/2022 1617 Last data filed at 07/26/2022 1400 Gross per 24 hour  Intake 1202.27 ml  Output --  Net 1202.27 ml    Additional Objective Labs: Basic Metabolic Panel: Recent Labs  Lab 07/22/22 1832 07/22/22 1903 07/23/22 0337 07/25/22 0218 07/26/22 1018  NA 132*   < > 130* 135 135  K 4.4   < > 3.8 3.5 3.7  CL 89*  --  91* 96* 95*  CO2 28  --  26 27  --   GLUCOSE 125*  --  130* 182* 105*  BUN 48*  --  49* 50* 32*  CREATININE 7.55*  --  8.12* 7.95* 6.50*  CALCIUM 8.7*  --  7.8* 7.9*  --   PHOS  --   --  5.0*  --   --    < > = values in this interval not displayed.   Liver Function Tests: Recent Labs  Lab 07/22/22 1832  AST 27  ALT 16  ALKPHOS 169*  BILITOT 1.4*  PROT 6.3*  ALBUMIN 3.9   Recent Labs  Lab 07/22/22 1832  LIPASE 82*   CBC: Recent Labs  Lab 07/22/22 1832 07/22/22 1903 07/23/22 0337 07/25/22 0218 07/25/22 1359 07/26/22 1018  WBC 7.1  --  8.4 5.5  --   --   NEUTROABS 6.0  --   --  4.5  --   --   HGB 7.9*   < > 7.8* 7.8* 9.7*  10.9*  HCT 23.6*   < > 23.6* 23.5* 28.4* 32.0*  MCV 88.1  --  90.4 90.4  --   --   PLT 88*  --  76* 91*  --   --    < > = values in this interval not displayed.   Blood Culture    Component Value Date/Time   SDES  07/22/2022 1835    BLOOD BLOOD RIGHT ARM Performed at [facility], [address], [place], [place] [address]    [facility]  07/22/2022 1835    Blood Culture adequate volume BOTTLES DRAWN AEROBIC AND ANAEROBIC Performed at [facility], [address] [place], [place] [address]    CULT  07/22/2022 1835    NO GROWTH 4 DAYS Performed at [facility], [address] [name], [place] [address]    REPTSTATUS PENDING 07/22/2022 1835    Cardiac Enzymes: No results for input(s): "CKTOTAL", "CKMB", "CKMBINDEX", "TROPONINI" in  the last 168 hours. CBG: Recent Labs  Lab 07/26/22 0530 07/26/22 0804 07/26/22 1009 07/26/22 1127 07/26/22 1230  GLUCAP 104* 102* 109* 100* 104*   Iron Studies: No results for input(s): "IRON", "TIBC", "TRANSFERRIN", "FERRITIN" in the last 72 hours. Lab Results  Component Value Date   INR 1.5 (H) 07/22/2022   INR 1.5 (H) 06/20/2022   INR 1.2 08/18/2021   Studies/Results: No results found.  Medications:   ceFAZolin (ANCEF) IV 2 g (07/26/22 1539)   magnesium sulfate bolus IVPB      vitamin C  1,000 mg Oral Daily   darbepoetin (ARANESP) injection - DIALYSIS  100 mcg Subcutaneous Q Thu-1800   docusate sodium  100 mg Oral BID   heparin  5,000 Units Subcutaneous Q8H   insulin aspart  0-6 Units Subcutaneous Q4H   levothyroxine  112 mcg Oral Q0600   nutrition supplement (JUVEN)  1 packet Oral BID BM   pantoprazole  40 mg Oral QHS   polyethylene glycol  17 g Oral Daily   rosuvastatin  10 mg Oral Daily   zinc sulfate  220 mg Oral Daily    Dialysis Orders: [facility] - Sun - Wed - Fri 3:48hr, BFR 425, EDW 88kg, L AVF, no heparin - Venofer  IV weekly.  Assessment/Plan: 1. RLE cellulitis:  Xray with subcutaneous air - ortho following, s/p R BKA today. On Vanc/Cefepime/Flagyl. 2. Acute Hypoxic Resp Failure: Intubated on arrival, extubated 4/16 3. ESRD: Running him on TTS schedule while here - Next HD 4/20. 4. HTN/volume: BP high, variable bed weights - 1L UFG. 5. Anemia: Hgb 7.8 - getting blood now. Ordered Aranesp to be given today. 6. Secondary hyperparathyroidism:  CorrCa/Phos ok - continue home meds. 7. Nutrition: Adding protein supp for wound healing.  8. T2DM 9. CAD  Salome Holmes, NP [facility] 07/26/2022,4:17 PM  LOS: 3 days

## 2022-07-26 NOTE — TOC Progression Note (Signed)
Transition of Care [facility]) - Initial/Assessment Note    Patient Details  Name: [name] MRN: [id number] Date of Birth: Apr 04, 1951  Transition of Care [facility]) CM/SW Contact:    Ralene Bathe, LCSWA Phone Number: 07/26/2022, 3:10 PM  Clinical Narrative:                 TOC received and acknowledged consult for home health v/s SNF.  Awaiting PT/OT assessments and recommendations to begin the SNF or home health process.    TOC following.         Patient Goals and CMS Choice            Expected Discharge Plan and Services                                              Prior Living Arrangements/Services                       Activities of Daily Living      Permission Sought/Granted                  Emotional Assessment              Admission diagnosis:  Acute respiratory failure [J96.00] Foot infection [L08.9] ESRD on dialysis [N18.6, Z99.2] Cellulitis of right foot [L03.115] Acute respiratory failure with hypoxia [J96.01] Severe sepsis [A41.9, R65.20] Patient Active Problem List   Diagnosis Date Noted   Severe sepsis 07/23/2022   Foot infection 07/22/2022   Acute respiratory failure 07/22/2022   Anemia due to chronic blood loss 06/23/2022   Heme positive stool 06/23/2022   Benign neoplasm of cecum 06/23/2022   Benign neoplasm of transverse colon 06/23/2022   Benign neoplasm of descending colon 06/23/2022   Abscess of right foot 05/17/2022   Contraindication to anticoagulation therapy GU Bleed 03/13/2022   Paroxysmal atrial fibrillation 08/18/2021   Anemia of chronic renal failure 08/18/2021   Thrombocytopenia 08/18/2021   Acute metabolic encephalopathy 08/18/2021   Action induced myoclonus 08/18/2021   Left below-knee amputee 05/11/2021   Symptomatic anemia 09/13/2020   COVID-19 virus infection 09/13/2020   Pressure injury of skin 06/29/2020   Gross hematuria 06/16/2020   Typical atrial flutter 03/24/2020   Bilateral  pleural effusion 02/24/2020   Healthcare maintenance 01/28/2020   Shortness of breath 01/28/2020   History of partial ray amputation of fourth toe of right foot 09/08/2019   Chronic cough 06/25/2019   Cutaneous abscess of right foot    Subacute osteomyelitis, right ankle and foot 12/14/2018   AKI (acute kidney injury) 12/14/2018   ESRD on dialysis 12/14/2018   S/P CABG (coronary artery bypass graft) 12/11/2018   Gait abnormality 08/24/2018   Diabetic neuropathy 02/06/2018   Cervical myelopathy 02/06/2018   Onychomycosis 10/30/2017   Other spondylosis with radiculopathy, cervical region 01/27/2017   Midfoot ulcer, right, limited to breakdown of skin 11/15/2016   Lateral epicondylitis, left elbow 08/12/2016   Prostate cancer 06/19/2016   Cellulitis of right foot 12/24/2015   Gout 09/14/2012   Hypertension associated with diabetes 09/14/2012   Hypothyroidism 09/14/2012   CAD (coronary artery disease) 09/14/2012   Insulin dependent type 2 diabetes mellitus 09/14/2012   Hyperlipidemia associated with type 2 diabetes mellitus    Neuropathy (HCC)    PCP:  Adolph Pollack, FNP Pharmacy:   [facility] [phone] -  [place], [place] - [address] AT [address] OF [facility] [address] [place] [address] Phone: [phone] Fax: [phone]     Social Determinants of Health (SDOH) Social History: SDOH Screenings   Food Insecurity: No Food Insecurity (06/20/2022)  Housing: Low Risk  (06/20/2022)  Transportation Needs: No Transportation Needs (06/20/2022)  Utilities: Not At Risk (06/20/2022)  Depression (PHQ2-9): Low Risk  (03/11/2022)  Tobacco Use: Medium Risk (07/26/2022)   SDOH Interventions:     Readmission Risk Interventions     No data to display

## 2022-07-26 NOTE — TOC Progression Note (Signed)
Transition of Care [facility]) - Progression Note    Patient Details  Name: [name] MRN: [id number] Date of Birth: 08-Oct-1950  Transition [facility]) CM/SW Contact  Tom-Johnson, Hershal Coria, RN Phone Number: 07/26/2022, 6:07 PM  Clinical Narrative:     CIR recommended, awaiting their assessment and approval. TOC will continue to follow.       Expected Discharge Plan and Services                                               Social Determinants of Health (SDOH) Interventions SDOH Screenings   Food Insecurity: No Food Insecurity (06/20/2022)  Housing: Low Risk  (06/20/2022)  Transportation Needs: No Transportation Needs (06/20/2022)  Utilities: Not At Risk (06/20/2022)  Depression (PHQ2-9): Low Risk  (03/11/2022)  Tobacco Use: Medium Risk (07/26/2022)    Readmission Risk Interventions     No data to display

## 2022-07-26 NOTE — Anesthesia Procedure Notes (Signed)
Anesthesia Regional Block: Popliteal block   Pre-Anesthetic Checklist: , timeout performed,  Correct Patient, Correct Site, Correct Laterality,  Correct Procedure, Correct Position, site marked,  Risks and benefits discussed,  Surgical consent,  Pre-op evaluation,  At surgeon's request and post-op pain management  Laterality: Right  Prep: chloraprep       Needles:  Injection technique: Single-shot  Needle Type: Echogenic Needle     Needle Length: 10cm  Needle Gauge: 21     Additional Needles:   Narrative:  Start time: 07/26/2022 10:25 AM End time: 07/26/2022 10:28 AM Injection made incrementally with aspirations every 5 mL.  Performed by: Personally  Anesthesiologist: Beryle Lathe, MD  Additional Notes: No pain on injection. No increased resistance to injection. Injection made in 5cc increments. Good needle visualization. Patient tolerated the procedure well.

## 2022-07-26 NOTE — Anesthesia Procedure Notes (Signed)
Procedure Name: LMA Insertion Date/Time: 07/26/2022 10:42 AM  Performed by: Marny Lowenstein, CRNAPre-anesthesia Checklist: Patient identified, Emergency Drugs available, Suction available and Patient being monitored Patient Re-evaluated:Patient Re-evaluated prior to induction Oxygen Delivery Method: Circle system utilized Preoxygenation: Pre-oxygenation with 100% oxygen Induction Type: IV induction Ventilation: Mask ventilation without difficulty LMA: LMA inserted LMA Size: 5.0 Number of attempts: 2 Placement Confirmation: positive ETCO2 and breath sounds checked- equal and bilateral Tube secured with: Tape Dental Injury: Teeth and Oropharynx as per pre-operative assessment  Comments: #4 LMA with large leak; #5 LMA easily inserted without leak

## 2022-07-26 NOTE — Progress Notes (Signed)
PT Cancellation Note  Patient Details Name: JENNIE BOLAR MRN: [id number] DOB: 12-Aug-1950   Cancelled Treatment:    Reason Eval/Treat Not Completed: Patient at procedure or test/unavailable  Patient off unit for surgery. Will follow.  Kathlyn Sacramento, PT, DPT Physical Therapist Acute Rehabilitation Services [facility] Outpatient Rehabilitation Services [facility]  07/26/2022, 10:10 AM

## 2022-07-26 NOTE — Op Note (Signed)
07/26/2022  11:55 AM  PATIENT:  [name]    PRE-OPERATIVE DIAGNOSIS:  Abscess Right Foot  POST-OPERATIVE DIAGNOSIS:  Same  PROCEDURE:  RIGHT BELOW KNEE AMPUTATION Application of Kerecis micro graft 38 cm  Application of Prevena customizable and Prevena arthroform wound VAC dressings Application of Vive Wear stump shrinker   SURGEON:  Nadara Mustard, MD  ANESTHESIA:   General  PREOPERATIVE INDICATIONS:  [name] is a  72 y.o. male with a diagnosis of Abscess Right Foot who failed conservative measures and elected for surgical management.    The risks benefits and alternatives were discussed with the patient preoperatively including but not limited to the risks of infection, bleeding, nerve injury, cardiopulmonary complications, the need for revision surgery, among others, and the patient was willing to proceed.  OPERATIVE IMPLANTS:   Implant Name Type Inv. Item Serial No. Manufacturer Lot No. LRB No. Used Action  GRAFT SKIN WND MICRO 38 - [id number] Tissue GRAFT SKIN WND MICRO 38  [employer] [id number] Right 1 Implanted     OPERATIVE FINDINGS: Tissue margins were clear.  Muscle did not have good color or contractility or consistency.  OPERATIVE PROCEDURE: Patient was brought to the operating room after undergoing a regional anesthetic.  After adequate levels anesthesia were obtained a thigh tourniquet was placed and the lower extremity was prepped using DuraPrep draped into a sterile field. The foot was draped out of the sterile field with impervious stockinette.  A timeout was called and the tourniquet inflated.  A transverse skin incision was made 12 cm distal to the tibial tubercle, the incision curved proximally, and a large posterior flap was created.  The tibia was transected just proximal to the skin incision and beveled anteriorly.  The fibula was transected just proximal to the tibial incision.  The sciatic nerve was pulled cut and allowed to retract.  The vascular  bundles were suture ligated with 2-0 silk.  The tourniquet was deflated and hemostasis obtained.      The Kerecis micro powder 38 cm was applied to the open wound that has a 200 cm surface area. The deep and superficial fascial layers were closed using #1 Vicryl.  The skin was closed using staples.    The Prevena customizable dressing was applied this was overwrapped with the arthroform sponge.  Collier Flowers was used to secure the sponges and the circumferential compression was secured to the skin with Dermatac.  This was connected to the wound VAC pump and had a good suction fit this was covered with a stump shrinker and a limb protector.  Patient was taken to the PACU in stable condition.   DISCHARGE PLANNING:  Antibiotic duration: 24-hour antibiotics  Weightbearing: Nonweightbearing on the operative extremity  Pain medication: Opioid pathway  Dressing care/ Wound VAC: Continue wound VAC with the Prevena plus pump at discharge for 1 week  Ambulatory devices: Walker or kneeling scooter  Discharge to: Discharge planning based on recommendations per physical therapy  Follow-up: In the office 1 week after discharge.

## 2022-07-26 NOTE — Care Management Important Message (Signed)
Important Message  Patient Details  Name: [name] MRN: [id number] Date of Birth: January 14, 1951   Medicare Important Message Given:  Yes     Dorena Bodo 07/26/2022, 1:29 PM

## 2022-07-26 NOTE — Anesthesia Procedure Notes (Signed)
Anesthesia Regional Block: Adductor canal block   Pre-Anesthetic Checklist: , timeout performed,  Correct Patient, Correct Site, Correct Laterality,  Correct Procedure, Correct Position, site marked,  Risks and benefits discussed,  Surgical consent,  Pre-op evaluation,  At surgeon's request and post-op pain management  Laterality: Right  Prep: chloraprep       Needles:  Injection technique: Single-shot  Needle Type: Echogenic Needle     Needle Length: 10cm  Needle Gauge: 21     Additional Needles:   Narrative:  Start time: 07/26/2022 10:21 AM End time: 07/26/2022 10:24 AM Injection made incrementally with aspirations every 5 mL.  Performed by: Personally  Anesthesiologist: Beryle Lathe, MD  Additional Notes: No pain on injection. No increased resistance to injection. Injection made in 5cc increments. Good needle visualization. Patient tolerated the procedure well.

## 2022-07-26 NOTE — Evaluation (Signed)
Physical Therapy Evaluation Patient Details Name: [name] MRN: [id number] DOB: [birth year] Today's Date: 07/26/2022  History of Present Illness  72 year old male admitted 4/15 with with altered mental status/weakness and hypoxia, found to be septic. S/p right below knee amputation 4/19. PMH of hypertension, hyperlipidemia, CAD, diabetes mellitus type 2, peripheral vascular disease, Left BKA, end-stage renal disease on home hemodialysis, hypothyroidism, prostate cancer.  Clinical Impression  Patient is s/p above surgery resulting in functional limitations due to the deficits listed below (see PT Problem List). PTA patient Mod I with mobility using a rollator, however more recently states he has WB restrictions and was having to use a wheelchair. Was seeing a personal trainer 2x/week for exercise. Wife conducts HD from home for patient. Assisted with set-up for showers but he bathed and dressed himself. Very motivated to improve and return to PLOF. Has prosthesis for LLE here in the hospital and was able to donne at EOB with min guard assist. Mod assist for sit<>stand several times, showing good UE strength and fair-good LLE strength/control with prosthesis in place. Mod assist for pivot to recliner, and some balance training in standing. Wife present and supportive. Nearly 24/7 assist available at home but wife is looking for an aide to supervise when she has to leave the house. Patient will benefit from acute skilled PT to increase their independence and safety with mobility to facilitate discharge.     Patient will benefit from intensive inpatient follow up therapy, >3 hours/day    Recommendations for follow up therapy are one component of a multi-disciplinary discharge planning process, led by the attending physician.  Recommendations may be updated based on patient status, additional functional criteria and insurance authorization.  Follow Up Recommendations Can patient physically be transported  by private vehicle: No     Assistance Recommended at Discharge Intermittent Supervision/Assistance  Patient can return home with the following  A lot of help with bathing/dressing/bathroom;A lot of help with walking and/or transfers;Assistance with cooking/housework;Assist for transportation;Help with stairs or ramp for entrance    Equipment Recommendations None recommended by PT  Recommendations for Other Services       Functional Status Assessment Patient has had a recent decline in their functional status and demonstrates the ability to make significant improvements in function in a reasonable and predictable amount of time.     Precautions / Restrictions Precautions Precautions: Fall;Knee (amputee; keep Rt knee extended) Precaution Comments: Reviewed with pt and wife Restrictions Weight Bearing Restrictions: Yes RLE Weight Bearing: Non weight bearing      Mobility  Bed Mobility Overal bed mobility: Needs Assistance Bed Mobility: Supine to Sit     Supine to sit: Min guard     General bed mobility comments: Min guard for safety. Cues for technique, good ability to lift RLE off bed and reach EOB.    Transfers Overall transfer level: Needs assistance Equipment used: Rolling walker (2 wheels) Transfers: Sit to/from Stand, Bed to chair/wheelchair/BSC Sit to Stand: Mod assist, From elevated surface Stand pivot transfers: Mod assist, From elevated surface         General transfer comment: Mod assist for boost to stand x3 from elevated bed surface. Cues for LLE alignment, hand placement and forward weight shift to rise. Once upright able to stabilize with min guard assist using RW. Good UE strength, reliant on RW for support. Mod assist for balance and weight shift, and walker control to pivot towards right side and chair. Minor Lt knee instability but able to  continue supporting through use of UEs on walker to stabilize.    Ambulation/Gait             Pre-gait  activities: weight shift from arms to Lt prosthetic, able to clear foot and briefly maintain WB through UEs only on walker.    Stairs            Wheelchair Mobility    Modified Rankin (Stroke Patients Only)       Balance Overall balance assessment: Needs assistance Sitting-balance support: No upper extremity supported, Feet supported Sitting balance-Leahy Scale: Good Sitting balance - Comments: Sat EOB with min guard supervision, was able to donne prosthesis on Lt independently.   Standing balance support: Bilateral upper extremity supported, During functional activity Standing balance-Leahy Scale: Poor                               Pertinent Vitals/Pain Pain Assessment Pain Assessment: 0-10 Pain Score: 0-No pain Pain Location: Rt residual limb Pain Descriptors / Indicators: Aching, Operative site guarding Pain Intervention(s): Monitored during session, Repositioned    Home Living Family/patient expects to be discharged to:: Inpatient rehab Living Arrangements: Spouse/significant other Available Help at Discharge: Family;Other (Comment) (Near 24/7 but wife has to leave at times.) Type of Home: House Home Access: Ramped entrance       Home Layout: Able to live on main level with bedroom/bathroom Home Equipment: Rollator (4 wheels);Shower seat;Cane - single Librarian, academic (2 wheels);Wheelchair - manual;BSC/3in1 Additional Comments: wife sets up HD at home    Prior Function Prior Level of Function : Needs assist             Mobility Comments: Pt could walk with rollator, was recently on restrictions and not allowed to walk due to RLE wound. ADLs Comments: Wife would assist with set-up for bath but did not physical help. Toilets himself. Ind with dressing.     Hand Dominance   Dominant Hand: Right    Extremity/Trunk Assessment   Upper Extremity Assessment Upper Extremity Assessment: Defer to OT evaluation    Lower Extremity  Assessment Lower Extremity Assessment: RLE deficits/detail;LLE deficits/detail RLE Deficits / Details: In shriker sock, wound vac on, very minimal drainage in canister. Guarding and weakness as expected post-op. LLE Deficits / Details: Hx prior BKA. Has prosthesis and able to WB through LLE using RW.       Communication   Communication: No difficulties  Cognition Arousal/Alertness: Awake/alert Behavior During Therapy: WFL for tasks assessed/performed Overall Cognitive Status: Within Functional Limits for tasks assessed                                          General Comments General comments (skin integrity, edema, etc.): Reviewed precautions with pt and wife.    Exercises Amputee Exercises Quad Sets: Strengthening, Both, 5 reps, Supine Hip Flexion/Marching: Strengthening, Right, 5 reps, Seated Chair Push Up: Strengthening, Both, 5 reps, Seated   Assessment/Plan    PT Assessment Patient needs continued PT services  PT Problem List Decreased strength;Decreased range of motion;Decreased activity tolerance;Decreased balance;Decreased mobility;Decreased knowledge of use of DME;Decreased knowledge of precautions;Pain       PT Treatment Interventions Gait training;DME instruction;Functional mobility training;Therapeutic activities;Therapeutic exercise;Balance training;Neuromuscular re-education;Patient/family education;Wheelchair mobility training;Modalities    PT Goals (Current goals can be found in the Care Plan section)  Acute Rehab  PT Goals Patient Stated Goal: Get rehab go home. PT Goal Formulation: With patient Time For Goal Achievement: 08/09/22 Potential to Achieve Goals: Good    Frequency Min 4X/week     Co-evaluation               AM-PAC PT "6 Clicks" Mobility  Outcome Measure Help needed turning from your back to your side while in a flat bed without using bedrails?: A Little Help needed moving from lying on your back to sitting on the  side of a flat bed without using bedrails?: A Little Help needed moving to and from a bed to a chair (including a wheelchair)?: A Lot Help needed standing up from a chair using your arms (e.g., wheelchair or bedside chair)?: A Lot Help needed to walk in hospital room?: Total Help needed climbing 3-5 steps with a railing? : Total 6 Click Score: 12    End of Session Equipment Utilized During Treatment: Gait belt Activity Tolerance: Patient tolerated treatment well Patient left: in chair;with call bell/phone within reach;with chair alarm set;with family/visitor present (Rt knee extended) Nurse Communication: Mobility status (+2 stand pivot transfer with walker - pt requests protein drink) PT Visit Diagnosis: Unsteadiness on feet (R26.81);Muscle weakness (generalized) (M62.81);Difficulty in walking, not elsewhere classified (R26.2)    Time: 1405-1500 PT Time Calculation (min) (ACUTE ONLY): 55 min   Charges:   PT Evaluation $PT Eval Moderate Complexity: 1 Mod PT Treatments $Therapeutic Activity: 23-37 mins $Neuromuscular Re-education: 8-22 mins        Kathlyn Sacramento, PT, DPT Physical Therapist Acute Rehabilitation Services [facility] Outpatient Rehabilitation Services [facility]   [place] Mount 07/26/2022, 3:24 PM

## 2022-07-26 NOTE — Anesthesia Postprocedure Evaluation (Signed)
Anesthesia Post Note  Patient: [name]  Procedure(s) Performed: RIGHT BELOW KNEE AMPUTATION (Right: Knee)     Patient location during evaluation: PACU Anesthesia Type: General Level of consciousness: awake and alert Pain management: pain level controlled Vital Signs Assessment: post-procedure vital signs reviewed and stable Respiratory status: spontaneous breathing, nonlabored ventilation and respiratory function stable Cardiovascular status: stable and blood pressure returned to baseline Anesthetic complications: no   No notable events documented.  Last Vitals:  Vitals:   07/26/22 1145 07/26/22 1200  BP: 134/71 (!) 149/60  Pulse: 75 72  Resp: 12 (!) 21  Temp:  36.7 C  SpO2: 94% 95%    Last Pain:  Vitals:   07/26/22 1200  TempSrc:   PainSc: 0-No pain                 Beryle Lathe

## 2022-07-27 DIAGNOSIS — J9600 Acute respiratory failure, unspecified whether with hypoxia or hypercapnia: Secondary | ICD-10-CM | POA: Diagnosis not present

## 2022-07-27 LAB — CULTURE, BLOOD (ROUTINE X 2)
Culture: NO GROWTH
Culture: NO GROWTH
Special Requests: ADEQUATE
Special Requests: ADEQUATE

## 2022-07-27 LAB — RENAL FUNCTION PANEL
Albumin: 2.9 g/dL — ABNORMAL LOW (ref 3.5–5.0)
Anion gap: 14 (ref 5–15)
BUN: 49 mg/dL — ABNORMAL HIGH (ref 8–23)
CO2: 24 mmol/L (ref 22–32)
Calcium: 7.9 mg/dL — ABNORMAL LOW (ref 8.9–10.3)
Chloride: 94 mmol/L — ABNORMAL LOW (ref 98–111)
Creatinine, Ser: 7.54 mg/dL — ABNORMAL HIGH (ref 0.61–1.24)
GFR, Estimated: 7 mL/min — ABNORMAL LOW (ref >60)
Glucose, Bld: 160 mg/dL — ABNORMAL HIGH (ref 70–99)
Phosphorus: 4.2 mg/dL (ref 2.5–4.6)
Potassium: 3.8 mmol/L (ref 3.5–5.1)
Sodium: 132 mmol/L — ABNORMAL LOW (ref 135–145)

## 2022-07-27 LAB — CBC
HCT: 29.3 % — ABNORMAL LOW (ref 39.0–52.0)
Hemoglobin: 9.8 g/dL — ABNORMAL LOW (ref 13.0–17.0)
MCH: 29.8 pg (ref 26.0–34.0)
MCHC: 33.4 g/dL (ref 30.0–36.0)
MCV: 89.1 fL (ref 80.0–100.0)
Platelets: 126 10*3/uL — ABNORMAL LOW (ref 150–400)
RBC: 3.29 MIL/uL — ABNORMAL LOW (ref 4.22–5.81)
RDW: 15.5 % (ref 11.5–15.5)
WBC: 7.9 10*3/uL (ref 4.0–10.5)
nRBC: 0 % (ref 0.0–0.2)

## 2022-07-27 LAB — GLUCOSE, CAPILLARY
Glucose-Capillary: 147 mg/dL — ABNORMAL HIGH (ref 70–99)
Glucose-Capillary: 155 mg/dL — ABNORMAL HIGH (ref 70–99)
Glucose-Capillary: 109 mg/dL — ABNORMAL HIGH (ref 70–99)
Glucose-Capillary: 128 mg/dL — ABNORMAL HIGH (ref 70–99)
Glucose-Capillary: 158 mg/dL — ABNORMAL HIGH (ref 70–99)

## 2022-07-27 MED ORDER — ORAL CARE MOUTH RINSE: 15.0000 mL | OROMUCOSAL | Status: DC | PRN

## 2022-07-27 MED ORDER — INSULIN ASPART 100 UNIT/ML IJ SOLN
0.0000 [IU] | Freq: Three times a day (TID) | INTRAMUSCULAR | Status: DC
  Administered 2022-07-28: 1 [IU] via SUBCUTANEOUS
  Administered 2022-07-28 – 2022-07-29 (×4): 2 [IU] via SUBCUTANEOUS

## 2022-07-27 MED ORDER — INSULIN ASPART 100 UNIT/ML IJ SOLN: 0.0000 [IU] | Freq: Every day | INTRAMUSCULAR | Status: DC

## 2022-07-27 NOTE — Plan of Care (Signed)
  Problem: Health Behavior/Discharge Planning: Goal: Ability to manage health-related needs will improve Outcome: Progressing   Problem: Clinical Measurements: Goal: Ability to maintain clinical measurements within normal limits will improve Outcome: Progressing Goal: Will remain free from infection Outcome: Progressing Goal: Diagnostic test results will improve Outcome: Progressing Goal: Respiratory complications will improve Outcome: Progressing Goal: Cardiovascular complication will be avoided Outcome: Progressing   Problem: Activity: Goal: Risk for activity intolerance will decrease Outcome: Progressing   Problem: Nutrition: Goal: Adequate nutrition will be maintained Outcome: Progressing   Problem: Coping: Goal: Level of anxiety will decrease Outcome: Progressing   Problem: Elimination: Goal: Will not experience complications related to bowel motility Outcome: Progressing Goal: Will not experience complications related to urinary retention Outcome: Progressing   Problem: Pain Managment: Goal: General experience of comfort will improve Outcome: Progressing   Problem: Safety: Goal: Ability to remain free from injury will improve Outcome: Progressing   Problem: Skin Integrity: Goal: Risk for impaired skin integrity will decrease Outcome: Progressing   Problem: Education: Goal: Ability to describe self-care measures that may prevent or decrease complications (Diabetes Survival Skills Education) will improve Outcome: Progressing Goal: Individualized Educational Video(s) Outcome: Progressing   Problem: Coping: Goal: Ability to adjust to condition or change in health will improve Outcome: Progressing   Problem: Fluid Volume: Goal: Ability to maintain a balanced intake and output will improve Outcome: Progressing   Problem: Health Behavior/Discharge Planning: Goal: Ability to identify and utilize available resources and services will improve Outcome:  Progressing Goal: Ability to manage health-related needs will improve Outcome: Progressing   Problem: Metabolic: Goal: Ability to maintain appropriate glucose levels will improve Outcome: Progressing   Problem: Nutritional: Goal: Maintenance of adequate nutrition will improve Outcome: Progressing Goal: Progress toward achieving an optimal weight will improve Outcome: Progressing   Problem: Skin Integrity: Goal: Risk for impaired skin integrity will decrease Outcome: Progressing   Problem: Tissue Perfusion: Goal: Adequacy of tissue perfusion will improve Outcome: Progressing   Problem: Education: Goal: Knowledge of the prescribed therapeutic regimen will improve Outcome: Progressing Goal: Ability to verbalize activity precautions or restrictions will improve Outcome: Progressing Goal: Understanding of discharge needs will improve Outcome: Progressing   Problem: Activity: Goal: Ability to perform//tolerate increased activity and mobilize with assistive devices will improve Outcome: Progressing   Problem: Clinical Measurements: Goal: Postoperative complications will be avoided or minimized Outcome: Progressing   Problem: Self-Care: Goal: Ability to meet self-care needs will improve Outcome: Progressing   Problem: Self-Concept: Goal: Ability to maintain and perform role responsibilities to the fullest extent possible will improve Outcome: Progressing   Problem: Pain Management: Goal: Pain level will decrease with appropriate interventions Outcome: Progressing   Problem: Skin Integrity: Goal: Demonstration of wound healing without infection will improve Outcome: Progressing

## 2022-07-27 NOTE — Progress Notes (Signed)
Patient ID: [name], male   DOB: [birth year], 72 y.o.   MRN: [id number] Patient is postoperative day 1 right transtibial amputation.  He is in dialysis.  There is no drainage in the wound VAC canister there is a good suction fit.  Patient states that he is not eating and the importance of diet and nutrition was discussed.  Discharge planning based on physical therapy recommendations for inpatient versus outpatient rehab.  Patient was previously an inpatient rehab for his left below-knee amputation.

## 2022-07-27 NOTE — Progress Notes (Signed)
PROGRESS NOTE    [name]  WUJ:[id number] DOB: 12/11/1950 DOA: 07/22/2022 PCP: Adolph Pollack, FNP   Brief Narrative:  72 year old male with history of hypertension, hyperlipidemia, CAD, diabetes mellitus type 2, peripheral vascular disease, diabetic foot wounds requiring amputations, end-stage renal disease on home hemodialysis, hypothyroidism, prostate cancer presented with altered mental status/weakness and hypoxia.  He required BiPAP on presentation with initial improvement but later his mental status deteriorated and patient subsequently got intubated.  COVID-19/influenza/RSV PCR were negative.  Chest x-ray was unremarkable.  Right foot x-ray showed subcutaneous gas within the soft tissues lateral and plantar to the fifth tarsometatarsal joint, reflective of local trauma and/or infection.  CT head showed no acute intracranial abnormality.  CT chest/abdomen/pelvis showed no acute abnormality except for mildly nodular hepatic contour (?  Cirrhosis), small volume ascites.  Nephrology and orthopedics were consulted.  He was extubated on 07/23/2022.  He was transferred to [facility] service from 07/24/2022 onwards.  He underwent right below-knee amputation on 07/26/2022 by Dr. Lajoyce Corners.  Assessment & Plan:   Acute respiratory failure with hypoxia -Required intubation on presentation.  Extubated on 07/23/2022. - transferred to [facility] service from 07/24/2022 onwards. -Currently on room air.  Sepsis: Present on admission Right lower extremity cellulitis -Right foot x-ray showed subcutaneous gas within the soft tissues lateral and plantar to the fifth tarsometatarsal joint, reflective of local trauma and/or infection. --Orthopedics/Dr. Lajoyce Corners following: underwent right below-knee amputation on 07/26/2022.  Wound care as per orthopedics recommendations.  Cultures negative so far.  Antibiotics have already been discontinued postoperatively as per orthopedics recommendations.  Acute metabolic  encephalopathy -Secondary to above.  Mental status much improved.  Monitor mental status.  CT head unremarkable.  End-stage renal disease on home hemodialysis -Nephrology following.  Dialysis as per nephrology schedule.  Hyponatremia -Managed by dialysis by nephrology.  Improved.  Monitor intermittently.  Anemia of chronic disease -From renal failure.  Hemoglobin currently stable.  Monitor intermittently.  Hypomagnesemia -Improved.  Thrombocytopenia -Questionable cause.  Improved.  Monitor intermittently.  Diabetic peripheral neuropathy Peripheral vascular disease Status post amputations (left BKA, right toe/ray amputation) -Lyrica on hold for now.  Continue supportive care.  Pain management. -Follow orthopedics recommendations.  Hypothyroidism -Continue levothyroxine  Hyperlipidemia -Continue statin  Diabetes mellitus type 2 -Continue CBGs with SSI.  Blood sugars currently stable.  Hypertension CAD -Blood pressure currently stable.  Home antihypertensives on hold.  Might have to resume after surgery if blood pressure remains elevated.  Outpatient follow-up with cardiology.  GERD -Continue PPI  Physical deconditioning -PT recommends inpatient rehab.  CIR consulted.    DVT prophylaxis: Subcutaneous heparin Code Status: Full Family Communication: None at bedside Disposition Plan: Status is: Inpatient Remains inpatient appropriate because: Of severity of illness.    Consultants: PCCM/nephrology/orthopedics  Procedures: Intubation/extubation  Antimicrobials:  Anti-infectives (From admission, onward)    Start     Dose/Rate Route Frequency Ordered Stop   07/26/22 1400  ceFAZolin (ANCEF) IVPB 2g/100 mL premix        2 g 200 mL/hr over 30 Minutes Intravenous Every 8 hours 07/26/22 1220 07/26/22 2215   07/25/22 2200  vancomycin (VANCOCIN) IVPB 1000 mg/200 mL premix  Status:  Discontinued        1,000 mg 200 mL/hr over 60 Minutes Intravenous Every T-Th-Sa  (Hemodialysis) 07/23/22 1147 07/24/22 0736   07/25/22 1200  vancomycin (VANCOCIN) IVPB 1000 mg/200 mL premix  Status:  Discontinued        1,000 mg 200 mL/hr over 60 Minutes Intravenous  Every T-Th-Sa (Hemodialysis) 07/24/22 0736 07/26/22 1237   07/24/22 1800  ceFEPIme (MAXIPIME) 1 g in sodium chloride 0.9 % 100 mL IVPB  Status:  Discontinued        1 g 200 mL/hr over 30 Minutes Intravenous Every 24 hours 07/22/22 2135 07/23/22 1147   07/24/22 0830  vancomycin (VANCOCIN) IVPB 1000 mg/200 mL premix        1,000 mg 200 mL/hr over 60 Minutes Intravenous  Once 07/24/22 0734 07/24/22 1006   07/23/22 2000  ceFEPIme (MAXIPIME) 1 g in sodium chloride 0.9 % 100 mL IVPB  Status:  Discontinued        1 g 200 mL/hr over 30 Minutes Intravenous Every 24 hours 07/23/22 1147 07/26/22 1237   07/23/22 1030  metroNIDAZOLE (FLAGYL) IVPB 500 mg  Status:  Discontinued        500 mg 100 mL/hr over 60 Minutes Intravenous Every 12 hours 07/23/22 0939 07/26/22 1237   07/23/22 0909  vancomycin variable dose per unstable renal function (pharmacist dosing)  Status:  Discontinued         Does not apply See admin instructions 07/23/22 0909 07/23/22 1147   07/22/22 1945  vancomycin (VANCOCIN) IVPB 1000 mg/200 mL premix       See Hyperspace for full Linked Orders Report.   1,000 mg 200 mL/hr over 60 Minutes Intravenous  Once 07/22/22 1842 07/22/22 2158   07/22/22 1845  ceFEPIme (MAXIPIME) 2 g in sodium chloride 0.9 % 100 mL IVPB        2 g 200 mL/hr over 30 Minutes Intravenous  Once 07/22/22 1838 07/22/22 1936   07/22/22 1845  metroNIDAZOLE (FLAGYL) IVPB 500 mg        500 mg 100 mL/hr over 60 Minutes Intravenous  Once 07/22/22 1838 07/22/22 1955   07/22/22 1845  vancomycin (VANCOCIN) IVPB 1000 mg/200 mL premix  Status:  Discontinued        1,000 mg 200 mL/hr over 60 Minutes Intravenous  Once 07/22/22 1838 07/22/22 1841   07/22/22 1845  vancomycin (VANCOCIN) IVPB 1000 mg/200 mL premix       See Hyperspace for full  Linked Orders Report.   1,000 mg 200 mL/hr over 60 Minutes Intravenous  Once 07/22/22 1842 07/22/22 2158        Subjective: Patient seen and examined at bedside undergoing hemodialysis.  Denies worsening shortness of breath, chest pain, fever or vomiting.  Complains of right lower extremity pain.   Objective: Vitals:   07/26/22 1225 07/26/22 1711 07/26/22 2010 07/27/22 0643  BP: 136/67 (!) 124/57 (!) 150/86 (!) 154/67  Pulse: 73 63 65 83  Resp: 18 18 14 14   Temp: 97.6 F (36.4 C) 98.2 F (36.8 C) 98.6 F (37 C) 98.2 F (36.8 C)  TempSrc: Oral  Oral Oral  SpO2: 97% (!) 78% 100% 91%  Weight:   98.8 kg 90.2 kg  Height:        Intake/Output Summary (Last 24 hours) at 07/27/2022 0803 Last data filed at 07/27/2022 0600 Gross per 24 hour  Intake 1102.27 ml  Output 0 ml  Net 1102.27 ml    Filed Weights   07/26/22 0500 07/26/22 2010 07/27/22 0643  Weight: 91.5 kg 98.8 kg 90.2 kg    Examination:  General: On room air.  No distress currently.  Chronically ill and deconditioned looking. ENT/neck: No obvious JVD elevation or palpable thyromegaly respiratory: Decreased breath sounds at bases bilaterally with some crackles CVS: S1-S2 heard; rate controlled currently Abdominal: Soft,  nontender, slightly distended; no organomegaly, bowel sounds heard extremities: Right BKA with dressing present; left BKA  CNS: Awake; slow to respond.  No obvious focal deficits  lymph: No cervical lymphadenopathy Skin: No obvious ecchymosis/rashes psych: No signs of agitation.  Flat affect.  Data Reviewed: I have personally reviewed following labs and imaging studies  CBC: Recent Labs  Lab 07/22/22 1832 07/22/22 1903 07/23/22 0023 07/23/22 0337 07/25/22 0218 07/25/22 1359 07/26/22 1018  WBC 7.1  --   --  8.4 5.5  --   --   NEUTROABS 6.0  --   --   --  4.5  --   --   HGB 7.9*   < > 7.8* 7.8* 7.8* 9.7* 10.9*  HCT 23.6*   < > 23.0* 23.6* 23.5* 28.4* 32.0*  MCV 88.1  --   --  90.4 90.4  --    --   PLT 88*  --   --  76* 91*  --   --    < > = values in this interval not displayed.    Basic Metabolic Panel: Recent Labs  Lab 07/22/22 1832 07/22/22 1903 07/22/22 2145 07/23/22 0023 07/23/22 0337 07/25/22 0218 07/26/22 1018  NA 132*   < > 128* 129* 130* 135 135  K 4.4   < > 3.9 3.9 3.8 3.5 3.7  CL 89*  --   --   --  91* 96* 95*  CO2 28  --   --   --  26 27  --   GLUCOSE 125*  --   --   --  130* 182* 105*  BUN 48*  --   --   --  49* 50* 32*  CREATININE 7.55*  --   --   --  8.12* 7.95* 6.50*  CALCIUM 8.7*  --   --   --  7.8* 7.9*  --   MG  --   --   --   --  1.6* 2.0  --   PHOS  --   --   --   --  5.0*  --   --    < > = values in this interval not displayed.    GFR: Estimated Creatinine Clearance: 11.6 mL/min (A) (by C-G formula based on SCr of 6.5 mg/dL (H)). Liver Function Tests: Recent Labs  Lab 07/22/22 1832  AST 27  ALT 16  ALKPHOS 169*  BILITOT 1.4*  PROT 6.3*  ALBUMIN 3.9    Recent Labs  Lab 07/22/22 1832  LIPASE 82*    No results for input(s): "AMMONIA" in the last 168 hours. Coagulation Profile: Recent Labs  Lab 07/22/22 1832  INR 1.5*    Cardiac Enzymes: No results for input(s): "CKTOTAL", "CKMB", "CKMBINDEX", "TROPONINI" in the last 168 hours. BNP (last 3 results) No results for input(s): "PROBNP" in the last 8760 hours. HbA1C: No results for input(s): "HGBA1C" in the last 72 hours. CBG: Recent Labs  Lab 07/26/22 1710 07/26/22 2008 07/26/22 2329 07/27/22 0359 07/27/22 0720  GLUCAP 175* 139* 141* 147* 155*    Lipid Profile: No results for input(s): "CHOL", "HDL", "LDLCALC", "TRIG", "CHOLHDL", "LDLDIRECT" in the last 72 hours.  Thyroid Function Tests: No results for input(s): "TSH", "T4TOTAL", "FREET4", "T3FREE", "THYROIDAB" in the last 72 hours. Anemia Panel: No results for input(s): "VITAMINB12", "FOLATE", "FERRITIN", "TIBC", "IRON", "RETICCTPCT" in the last 72 hours. Sepsis Labs: Recent Labs  Lab 07/22/22 1835  07/22/22 2019  LATICACIDVEN 0.6 0.6     Recent Results (from  the past 240 hour(s))  Culture, blood (Routine x 2)     Status: None (Preliminary result)   Collection Time: 07/22/22  6:33 PM   Specimen: BLOOD  Result Value Ref Range Status   Specimen Description   Final    BLOOD BLOOD RIGHT FOREARM Performed at [facility], [address] [place], [place] [address]    Special Requests   Final    Blood Culture adequate volume BOTTLES DRAWN AEROBIC AND ANAEROBIC Performed at [facility], [address], [place], [place] [address]    Culture   Final    NO GROWTH 4 DAYS Performed at [facility], [address] [place], [place] [address]    Report Status PENDING  Incomplete  Culture, blood (Routine x 2)     Status: None (Preliminary result)   Collection Time: 07/22/22  6:35 PM   Specimen: BLOOD  Result Value Ref Range Status   Specimen Description   Final    BLOOD BLOOD RIGHT ARM Performed at [facility], [address] [place], [place] [address]    Special Requests   Final    Blood Culture adequate volume BOTTLES DRAWN AEROBIC AND ANAEROBIC Performed at [facility], [address], [place], [place] [address]    Culture   Final    NO GROWTH 4 DAYS Performed at [facility], [address], [place], [place] [address]    Report Status PENDING  Incomplete  Resp panel by RT-PCR (RSV, Flu A&B, Covid) Anterior Nasal Swab     Status: None   Collection Time: 07/22/22  6:36 PM   Specimen: Anterior Nasal Swab  Result Value Ref Range Status   SARS Coronavirus 2 by RT PCR NEGATIVE NEGATIVE Final    Comment: (NOTE) SARS-CoV-2 target nucleic acids are NOT DETECTED.  The SARS-CoV-2 RNA is generally detectable in upper respiratory specimens during the acute phase of infection. The lowest concentration of SARS-CoV-2 viral copies this assay can detect is 138 copies/mL. A negative result does  not preclude SARS-Cov-2 infection and should not be used as the sole basis for treatment or other patient management decisions. A negative result may occur with  improper specimen collection/handling, submission of specimen other than nasopharyngeal swab, presence of viral mutation(s) within the areas targeted by this assay, and inadequate number of viral copies(<138 copies/mL). A negative result must be combined with clinical observations, patient history, and epidemiological information. The expected result is Negative.  Fact Sheet for Patients:  BloggerCourse.com  Fact Sheet for Healthcare Providers:  SeriousBroker.it  This test is no t yet approved or cleared by the [place] FDA and  has been authorized for detection and/or diagnosis of SARS-CoV-2 by FDA under an Emergency Use Authorization (EUA). This EUA will remain  in effect (meaning this test can be used) for the duration of the COVID-19 declaration under Section 564(b)(1) of the Act, 21 U.S.C.section 360bbb-3(b)(1), unless the authorization is terminated  or revoked sooner.       Influenza A by PCR NEGATIVE NEGATIVE Final   Influenza B by PCR NEGATIVE NEGATIVE Final    Comment: (NOTE) The Xpert Xpress SARS-CoV-2/FLU/RSV plus assay is intended as an aid in the diagnosis of influenza from Nasopharyngeal swab specimens and should not be used as a sole basis for treatment. Nasal washings and aspirates are unacceptable for Xpert Xpress SARS-CoV-2/FLU/RSV testing.  Fact Sheet for Patients: BloggerCourse.com  Fact Sheet for Healthcare Providers: SeriousBroker.it  This test is not yet  approved or cleared by the [place] and has been authorized for detection and/or diagnosis of SARS-CoV-2 by FDA under an Emergency Use Authorization (EUA). This EUA will remain in effect (meaning this test can be used) for the  duration of the COVID-19 declaration under Section 564(b)(1) of the Act, 21 U.S.C. section 360bbb-3(b)(1), unless the authorization is terminated or revoked.     Resp Syncytial Virus by PCR NEGATIVE NEGATIVE Final    Comment: (NOTE) Fact Sheet for Patients: BloggerCourse.com  Fact Sheet for Healthcare Providers: SeriousBroker.it  This test is not yet approved or cleared by the [place] FDA and has been authorized for detection and/or diagnosis of SARS-CoV-2 by FDA under an Emergency Use Authorization (EUA). This EUA will remain in effect (meaning this test can be used) for the duration of the COVID-19 declaration under Section 564(b)(1) of the Act, 21 U.S.C. section 360bbb-3(b)(1), unless the authorization is terminated or revoked.  Performed at [facility], [address] [place], [place] [address]   MRSA Next Gen by PCR, Nasal     Status: None   Collection Time: 07/23/22  2:30 AM   Specimen: Nasal Mucosa; Nasal Swab  Result Value Ref Range Status   MRSA by PCR Next Gen NOT DETECTED NOT DETECTED Final    Comment: (NOTE) The GeneXpert MRSA Assay (FDA approved for NASAL specimens only), is one component of a comprehensive MRSA colonization surveillance program. It is not intended to diagnose MRSA infection nor to guide or monitor treatment for MRSA infections. Test performance is not FDA approved in patients less than 35 years old. Performed at [facility], [address] [place], [place] [address]          Radiology Studies: No results found.      Scheduled Meds:  vitamin C  1,000 mg Oral Daily   darbepoetin (ARANESP) injection - DIALYSIS  100 mcg Subcutaneous Q Thu-1800   docusate sodium  100 mg Oral BID   heparin  5,000 Units Subcutaneous Q8H   insulin aspart  0-6 Units Subcutaneous Q4H   levothyroxine  112 mcg Oral Q0600   nutrition supplement (JUVEN)  1 packet Oral BID BM    pantoprazole  40 mg Oral QHS   polyethylene glycol  17 g Oral Daily   rosuvastatin  10 mg Oral Daily   zinc sulfate  220 mg Oral Daily   Continuous Infusions:  magnesium sulfate bolus IVPB            Glade Lloyd, MD [facility] 07/27/2022, 8:03 AM

## 2022-07-27 NOTE — Progress Notes (Signed)
[facility] Progress Note   Subjective:    Seen and examined patient on HD. Tolerating UFG 1L. Blood pressure 143/64. Patient is resting with denies any acute issues.  Objective Vitals:   07/27/22 0927 07/27/22 0957 07/27/22 1027 07/27/22 1057  BP: (!) 149/68 (!) 135/54 (!) 123/51 126/64  Pulse: 70 78 71 81  Resp: Temp:      TempSrc:      SpO2: 100% 100% 99% 100%  Weight:      Height:       Physical Exam General: Chronically ill appearing man, NAD Heart: RRR; no murmur Lungs: CTA anteriorly Abdomen: soft Extremities: L BKA, RLE with erythema and 1+ edema Dialysis Access: LUE AVF + bruit   Filed Weights   07/26/22 2010 07/27/22 0643 07/27/22 0853  Weight: 98.8 kg 90.2 kg 88.8 kg    Intake/Output Summary (Last 24 hours) at 07/27/2022 1118 Last data filed at 07/27/2022 0600 Gross per 24 hour  Intake 652.27 ml  Output 0 ml  Net 652.27 ml    Additional Objective Labs: Basic Metabolic Panel: Recent Labs  Lab 07/23/22 0337 07/25/22 0218 07/26/22 1018 07/27/22 0801  NA 130* 135 135 132*  K 3.8 3.5 3.7 3.8  CL 91* 96* 95* 94*  CO2 26 27  --  24  GLUCOSE 130* 182* 105* 160*  BUN 49* 50* 32* 49*  CREATININE 8.12* 7.95* 6.50* 7.54*  CALCIUM 7.8* 7.9*  --  7.9*  PHOS 5.0*  --   --  4.2   Liver Function Tests: Recent Labs  Lab 07/22/22 1832 07/27/22 0801  AST 27  --   ALT 16  --   ALKPHOS 169*  --   BILITOT 1.4*  --   PROT 6.3*  --   ALBUMIN 3.9 2.9*   Recent Labs  Lab 07/22/22 1832  LIPASE 82*   CBC: Recent Labs  Lab 07/22/22 1832 07/22/22 1903 07/23/22 0337 07/25/22 0218 07/25/22 1359 07/26/22 1018 07/27/22 0801  WBC 7.1  --  8.4 5.5  --   --  7.9  NEUTROABS 6.0  --   --  4.5  --   --   --   HGB 7.9*   < > 7.8* 7.8* 9.7* 10.9* 9.8*  HCT 23.6*   < > 23.6* 23.5* 28.4* 32.0* 29.3*  MCV 88.1  --  90.4 90.4  --   --  89.1  PLT 88*  --  76* 91*  --   --  126*   < > = values in this interval not displayed.   Blood  Culture    Component Value Date/Time   SDES  07/22/2022 1835    BLOOD BLOOD RIGHT ARM Performed at [facility], [address] [place], [place] [address]    [facility]  07/22/2022 1835    Blood Culture adequate volume BOTTLES DRAWN AEROBIC AND ANAEROBIC Performed at [facility], [address] [place], [place] [address]    CULT  07/22/2022 1835    NO GROWTH 5 DAYS Performed at [facility], [address], [place], [place] [address]    REPTSTATUS 07/27/2022 FINAL 07/22/2022 1835    Cardiac Enzymes: No results for input(s): "CKTOTAL", "CKMB", "CKMBINDEX", "TROPONINI" in the last 168 hours. CBG: Recent Labs  Lab 07/26/22 1710 07/26/22 2008 07/26/22 2329 07/27/22 0359 07/27/22 0720  GLUCAP 175* 139* 141* 147* 155*   Iron Studies: No results for input(s): "IRON", "TIBC", "TRANSFERRIN", "FERRITIN" in the last  72 hours. Lab Results  Component Value Date   INR 1.5 (H) 07/22/2022   INR 1.5 (H) 06/20/2022   INR 1.2 08/18/2021   Studies/Results: No results found.  Medications:  magnesium sulfate bolus IVPB      vitamin C  1,000 mg Oral Daily   darbepoetin (ARANESP) injection - DIALYSIS  100 mcg Subcutaneous Q Thu-1800   docusate sodium  100 mg Oral BID   heparin  5,000 Units Subcutaneous Q8H   insulin aspart  0-6 Units Subcutaneous Q4H   levothyroxine  112 mcg Oral Q0600   nutrition supplement (JUVEN)  1 packet Oral BID BM   pantoprazole  40 mg Oral QHS   polyethylene glycol  17 g Oral Daily   rosuvastatin  10 mg Oral Daily   zinc sulfate  220 mg Oral Daily    Dialysis Orders: [facility] - Sun - Wed - Fri 3:48hr, BFR 425, EDW 88kg, L AVF, no heparin - Venofer  IV weekly.  Assessment/Plan: 1. RLE cellulitis: Xray with subcutaneous air - ortho following, s/p R BKA 4/19 by Dr. Lajoyce Corners. On wound vac. On Vanc/Cefepime/Flagyl. 2. Acute Hypoxic Resp Failure: Intubated on arrival, extubated 4/16 3. ESRD: Running him on TTS  schedule while here - On HD 4/20. 4. HTN/volume: BP stable on HD, variable bed weights - 1L UFG. 5. Anemia: Hgb 7.8 - s/p 2 units PRBCs 4/18. Aranesp given 4/18.. 6. Secondary hyperparathyroidism:  CorrCa/Phos ok - continue home meds. 7. Nutrition: Adding protein supp for wound healing.  8. T2DM 9. CAD  Salome Holmes, NP  [facility] 07/27/2022,11:18 AM  LOS: 4 days

## 2022-07-27 NOTE — Progress Notes (Signed)
Attempted to get pt weight without the prosthetic leg, pt refused

## 2022-07-28 DIAGNOSIS — J9600 Acute respiratory failure, unspecified whether with hypoxia or hypercapnia: Secondary | ICD-10-CM | POA: Diagnosis not present

## 2022-07-28 DIAGNOSIS — Z992 Dependence on renal dialysis: Secondary | ICD-10-CM | POA: Diagnosis not present

## 2022-07-28 DIAGNOSIS — L03115 Cellulitis of right lower limb: Secondary | ICD-10-CM | POA: Diagnosis not present

## 2022-07-28 DIAGNOSIS — N186 End stage renal disease: Secondary | ICD-10-CM | POA: Diagnosis not present

## 2022-07-28 LAB — GLUCOSE, CAPILLARY
Glucose-Capillary: 129 mg/dL — ABNORMAL HIGH (ref 70–99)
Glucose-Capillary: 164 mg/dL — ABNORMAL HIGH (ref 70–99)
Glucose-Capillary: 158 mg/dL — ABNORMAL HIGH (ref 70–99)
Glucose-Capillary: 125 mg/dL — ABNORMAL HIGH (ref 70–99)

## 2022-07-28 MED ORDER — NEPRO/CARBSTEADY PO LIQD
237.0000 mL | Freq: Three times a day (TID) | ORAL | Status: DC
  Administered 2022-07-28 – 2022-07-29 (×3): 237 mL via ORAL

## 2022-07-28 NOTE — Progress Notes (Signed)
[facility] Progress Note   Subjective:    Seen and examined patient at bedside. No acute issues. Tolerated yesterday's HD with net UF 1L. Next HD 4/23.  Objective Vitals:   07/27/22 1623 07/27/22 2035 07/28/22 0332 07/28/22 0907  BP: (!) 155/55 (!) 158/85 (!) 153/74 (!) 148/60  Pulse: 72 81 88 77  Resp: Temp: 98.1 F (36.7 C) 98.6 F (37 C) 98.4 F (36.9 C) 98.4 F (36.9 C)  TempSrc: Oral Oral Oral Oral  SpO2: 100% 98% 96% 98%  Weight:      Height:       Physical Exam General: Chronically ill appearing man, NAD Heart: RRR; no murmur Lungs: CTA anteriorly Abdomen: soft Extremities: L BKA, RLE with erythema and 1+ edema Dialysis Access: LUE AVF + bruit  Filed Weights   07/27/22 0643 07/27/22 0853 07/27/22 1256  Weight: 90.2 kg 88.8 kg 87.8 kg    Intake/Output Summary (Last 24 hours) at 07/28/2022 1130 Last data filed at 07/28/2022 0930 Gross per 24 hour  Intake 680 ml  Output 1000 ml  Net -320 ml    Additional Objective Labs: Basic Metabolic Panel: Recent Labs  Lab 07/23/22 0337 07/25/22 0218 07/26/22 1018 07/27/22 0801  NA 130* 135 135 132*  K 3.8 3.5 3.7 3.8  CL 91* 96* 95* 94*  CO2 26 27  --  24  GLUCOSE 130* 182* 105* 160*  BUN 49* 50* 32* 49*  CREATININE 8.12* 7.95* 6.50* 7.54*  CALCIUM 7.8* 7.9*  --  7.9*  PHOS 5.0*  --   --  4.2   Liver Function Tests: Recent Labs  Lab 07/22/22 1832 07/27/22 0801  AST 27  --   ALT 16  --   ALKPHOS 169*  --   BILITOT 1.4*  --   PROT 6.3*  --   ALBUMIN 3.9 2.9*   Recent Labs  Lab 07/22/22 1832  LIPASE 82*   CBC: Recent Labs  Lab 07/22/22 1832 07/22/22 1903 07/23/22 0337 07/25/22 0218 07/25/22 1359 07/26/22 1018 07/27/22 0801  WBC 7.1  --  8.4 5.5  --   --  7.9  NEUTROABS 6.0  --   --  4.5  --   --   --   HGB 7.9*   < > 7.8* 7.8* 9.7* 10.9* 9.8*  HCT 23.6*   < > 23.6* 23.5* 28.4* 32.0* 29.3*  MCV 88.1  --  90.4 90.4  --   --  89.1  PLT 88*  --  76* 91*  --    --  126*   < > = values in this interval not displayed.   Blood Culture    Component Value Date/Time   SDES  07/22/2022 1835    BLOOD BLOOD RIGHT ARM Performed at [facility], [address] [place], [place] [address]    [facility]  07/22/2022 1835    Blood Culture adequate volume BOTTLES DRAWN AEROBIC AND ANAEROBIC Performed at [facility], [address] [place], [place] [address]    CULT  07/22/2022 1835    NO GROWTH 5 DAYS Performed at [facility], [address] [place], [place] [address]    REPTSTATUS 07/27/2022 FINAL 07/22/2022 1835    Cardiac Enzymes: No results for input(s): "CKTOTAL", "CKMB", "CKMBINDEX", "TROPONINI" in the last 168 hours. CBG: Recent Labs  Lab 07/27/22 1325 07/27/22 1619 07/27/22 2120 07/28/22 0722 07/28/22 1106  GLUCAP 109* 128* 158* 129* 164*   Iron Studies:  No results for input(s): "IRON", "TIBC", "TRANSFERRIN", "FERRITIN" in the last 72 hours. Lab Results  Component Value Date   INR 1.5 (H) 07/22/2022   INR 1.5 (H) 06/20/2022   INR 1.2 08/18/2021   Studies/Results: No results found.  Medications:  magnesium sulfate bolus IVPB      vitamin C  1,000 mg Oral Daily   darbepoetin (ARANESP) injection - DIALYSIS  100 mcg Subcutaneous Q Thu-1800   docusate sodium  100 mg Oral BID   heparin  5,000 Units Subcutaneous Q8H   insulin aspart  0-5 Units Subcutaneous QHS   insulin aspart  0-9 Units Subcutaneous TID WC   levothyroxine  112 mcg Oral Q0600   nutrition supplement (JUVEN)  1 packet Oral BID BM   pantoprazole  40 mg Oral QHS   polyethylene glycol  17 g Oral Daily   rosuvastatin  10 mg Oral Daily   zinc sulfate  220 mg Oral Daily    Dialysis Orders: HHD - Sun - Wed - Fri 3:48hr, BFR 425, EDW 88kg, L AVF, no heparin - Venofer  IV weekly.  Assessment/Plan: 1. RLE cellulitis: Xray with subcutaneous air - ortho following, s/p R BKA 4/19 by Dr. Lajoyce Corners. On wound vac. On  Vanc/Cefepime/Flagyl. 2. Acute Hypoxic Resp Failure: Intubated on arrival, extubated 4/16 3. ESRD: Running him on TTS schedule while here - Next HD 4/23. 4. HTN/volume: BP stable on HD, variable bed weights - 1L UFG. 5. Anemia: Hgb now 9.8 - s/p 2 units PRBCs 4/18. Aranesp given 4/18.. 6. Secondary hyperparathyroidism:  CorrCa/Phos ok - continue home meds. 7. Nutrition: Adding protein supp for wound healing.  8. T2DM 9. CAD  Salome Holmes, NP [facility] 07/28/2022,11:30 AM  LOS: 5 days

## 2022-07-28 NOTE — Plan of Care (Signed)
  Problem: Health Behavior/Discharge Planning: Goal: Ability to manage health-related needs will improve Outcome: Progressing   Problem: Clinical Measurements: Goal: Ability to maintain clinical measurements within normal limits will improve Outcome: Progressing Goal: Will remain free from infection Outcome: Progressing Goal: Diagnostic test results will improve Outcome: Progressing Goal: Respiratory complications will improve Outcome: Progressing Goal: Cardiovascular complication will be avoided Outcome: Progressing   Problem: Activity: Goal: Risk for activity intolerance will decrease Outcome: Progressing   Problem: Nutrition: Goal: Adequate nutrition will be maintained Outcome: Progressing   Problem: Coping: Goal: Level of anxiety will decrease Outcome: Progressing   Problem: Elimination: Goal: Will not experience complications related to bowel motility Outcome: Progressing Goal: Will not experience complications related to urinary retention Outcome: Progressing   Problem: Pain Managment: Goal: General experience of comfort will improve Outcome: Progressing   Problem: Safety: Goal: Ability to remain free from injury will improve Outcome: Progressing   Problem: Skin Integrity: Goal: Risk for impaired skin integrity will decrease Outcome: Progressing   Problem: Education: Goal: Ability to describe self-care measures that may prevent or decrease complications (Diabetes Survival Skills Education) will improve Outcome: Progressing Goal: Individualized Educational Video(s) Outcome: Progressing   Problem: Coping: Goal: Ability to adjust to condition or change in health will improve Outcome: Progressing   Problem: Fluid Volume: Goal: Ability to maintain a balanced intake and output will improve Outcome: Progressing   Problem: Health Behavior/Discharge Planning: Goal: Ability to identify and utilize available resources and services will improve Outcome:  Progressing Goal: Ability to manage health-related needs will improve Outcome: Progressing   Problem: Metabolic: Goal: Ability to maintain appropriate glucose levels will improve Outcome: Progressing   Problem: Nutritional: Goal: Maintenance of adequate nutrition will improve Outcome: Progressing Goal: Progress toward achieving an optimal weight will improve Outcome: Progressing   Problem: Skin Integrity: Goal: Risk for impaired skin integrity will decrease Outcome: Progressing   Problem: Tissue Perfusion: Goal: Adequacy of tissue perfusion will improve Outcome: Progressing   Problem: Education: Goal: Knowledge of the prescribed therapeutic regimen will improve Outcome: Progressing Goal: Ability to verbalize activity precautions or restrictions will improve Outcome: Progressing Goal: Understanding of discharge needs will improve Outcome: Progressing   Problem: Activity: Goal: Ability to perform//tolerate increased activity and mobilize with assistive devices will improve Outcome: Progressing   Problem: Clinical Measurements: Goal: Postoperative complications will be avoided or minimized Outcome: Progressing   Problem: Self-Care: Goal: Ability to meet self-care needs will improve Outcome: Progressing   Problem: Self-Concept: Goal: Ability to maintain and perform role responsibilities to the fullest extent possible will improve Outcome: Progressing   Problem: Pain Management: Goal: Pain level will decrease with appropriate interventions Outcome: Progressing   Problem: Skin Integrity: Goal: Demonstration of wound healing without infection will improve Outcome: Progressing   

## 2022-07-28 NOTE — Progress Notes (Signed)
PROGRESS NOTE    [name]  ZOX:[id number] DOB: Sep 09, 1950 DOA: 07/22/2022 PCP: Adolph Pollack, FNP   Brief Narrative:  72 year old male with history of hypertension, hyperlipidemia, CAD, diabetes mellitus type 2, peripheral vascular disease, diabetic foot wounds requiring amputations, end-stage renal disease on home hemodialysis, hypothyroidism, prostate cancer presented with altered mental status/weakness and hypoxia.  He required BiPAP on presentation with initial improvement but later his mental status deteriorated and patient subsequently got intubated.  COVID-19/influenza/RSV PCR were negative.  Chest x-ray was unremarkable.  Right foot x-ray showed subcutaneous gas within the soft tissues lateral and plantar to the fifth tarsometatarsal joint, reflective of local trauma and/or infection.  CT head showed no acute intracranial abnormality.  CT chest/abdomen/pelvis showed no acute abnormality except for mildly nodular hepatic contour (?  Cirrhosis), small volume ascites.  Nephrology and orthopedics were consulted.  He was extubated on 07/23/2022.  He was transferred to [facility] service from 07/24/2022 onwards.  He underwent right below-knee amputation on 07/26/2022 by Dr. Lajoyce Corners.  Assessment & Plan:   Acute respiratory failure with hypoxia -Required intubation on presentation.  Extubated on 07/23/2022. - transferred to [facility] service from 07/24/2022 onwards. -Currently on room air.  Sepsis: Present on admission Right lower extremity cellulitis -Right foot x-ray showed subcutaneous gas within the soft tissues lateral and plantar to the fifth tarsometatarsal joint, reflective of local trauma and/or infection. --Orthopedics/Dr. Lajoyce Corners following: underwent right below-knee amputation on 07/26/2022.  Wound care as per orthopedics recommendations.  Cultures negative so far.  Antibiotics have already been discontinued postoperatively as per orthopedics recommendations. -PT recommended CIR placement.  CIR has  been consulted.  Acute metabolic encephalopathy -Secondary to above.  Mental status much improved.  Monitor mental status.  CT head unremarkable.  End-stage renal disease on home hemodialysis -Nephrology following.  Dialysis as per nephrology schedule.  Hyponatremia -Managed by dialysis by nephrology.  Improved.  Monitor intermittently.  Anemia of chronic disease -From renal failure.  Hemoglobin currently stable.  Monitor intermittently.  Hypomagnesemia -Improved.  No labs today  Thrombocytopenia -Questionable cause.  Improved.  Monitor intermittently.  Diabetic peripheral neuropathy Peripheral vascular disease Status post amputations (left BKA, right toe/ray amputation) -Lyrica on hold for now.  Continue supportive care.  Pain management. -Follow orthopedics recommendations.  Hypothyroidism -Continue levothyroxine  Hyperlipidemia -Continue statin  Diabetes mellitus type 2 -Continue CBGs with SSI.  Blood sugars currently stable.  Hypertension CAD -Blood pressure currently stable.  Home antihypertensives on hold.  Might have to resume if blood pressure remains elevated.  Outpatient follow-up with cardiology.  GERD -Continue PPI  Physical deconditioning -PT recommends inpatient rehab.  CIR consulted.    DVT prophylaxis: Subcutaneous heparin Code Status: Full Family Communication: None at bedside Disposition Plan: Status is: Inpatient Remains inpatient appropriate because: Of severity of illness.    Consultants: PCCM/nephrology/orthopedics  Procedures: Intubation/extubation  Antimicrobials:  Anti-infectives (From admission, onward)    Start     Dose/Rate Route Frequency Ordered Stop   07/26/22 1400  ceFAZolin (ANCEF) IVPB 2g/100 mL premix        2 g 200 mL/hr over 30 Minutes Intravenous Every 8 hours 07/26/22 1220 07/26/22 2215   07/25/22 2200  vancomycin (VANCOCIN) IVPB 1000 mg/200 mL premix  Status:  Discontinued        1,000 mg 200 mL/hr over 60  Minutes Intravenous Every T-Th-Sa (Hemodialysis) 07/23/22 1147 07/24/22 0736   07/25/22 1200  vancomycin (VANCOCIN) IVPB 1000 mg/200 mL premix  Status:  Discontinued  1,000 mg 200 mL/hr over 60 Minutes Intravenous Every T-Th-Sa (Hemodialysis) 07/24/22 0736 07/26/22 1237   07/24/22 1800  ceFEPIme (MAXIPIME) 1 g in sodium chloride 0.9 % 100 mL IVPB  Status:  Discontinued        1 g 200 mL/hr over 30 Minutes Intravenous Every 24 hours 07/22/22 2135 07/23/22 1147   07/24/22 0830  vancomycin (VANCOCIN) IVPB 1000 mg/200 mL premix        1,000 mg 200 mL/hr over 60 Minutes Intravenous  Once 07/24/22 0734 07/24/22 1006   07/23/22 2000  ceFEPIme (MAXIPIME) 1 g in sodium chloride 0.9 % 100 mL IVPB  Status:  Discontinued        1 g 200 mL/hr over 30 Minutes Intravenous Every 24 hours 07/23/22 1147 07/26/22 1237   07/23/22 1030  metroNIDAZOLE (FLAGYL) IVPB 500 mg  Status:  Discontinued        500 mg 100 mL/hr over 60 Minutes Intravenous Every 12 hours 07/23/22 0939 07/26/22 1237   07/23/22 0909  vancomycin variable dose per unstable renal function (pharmacist dosing)  Status:  Discontinued         Does not apply See admin instructions 07/23/22 0909 07/23/22 1147   07/22/22 1945  vancomycin (VANCOCIN) IVPB 1000 mg/200 mL premix       See Hyperspace for full Linked Orders Report.   1,000 mg 200 mL/hr over 60 Minutes Intravenous  Once 07/22/22 1842 07/22/22 2158   07/22/22 1845  ceFEPIme (MAXIPIME) 2 g in sodium chloride 0.9 % 100 mL IVPB        2 g 200 mL/hr over 30 Minutes Intravenous  Once 07/22/22 1838 07/22/22 1936   07/22/22 1845  metroNIDAZOLE (FLAGYL) IVPB 500 mg        500 mg 100 mL/hr over 60 Minutes Intravenous  Once 07/22/22 1838 07/22/22 1955   07/22/22 1845  vancomycin (VANCOCIN) IVPB 1000 mg/200 mL premix  Status:  Discontinued        1,000 mg 200 mL/hr over 60 Minutes Intravenous  Once 07/22/22 1838 07/22/22 1841   07/22/22 1845  vancomycin (VANCOCIN) IVPB 1000 mg/200 mL premix        See Hyperspace for full Linked Orders Report.   1,000 mg 200 mL/hr over 60 Minutes Intravenous  Once 07/22/22 1842 07/22/22 2158        Subjective: Patient seen and examined at bedside.  Complains of intermittent right lower extremity pain.  No fever, chest pain, worsening shortness of breath reported.   Objective: Vitals:   07/27/22 1301 07/27/22 1623 07/27/22 2035 07/28/22 0332  BP: (!) 159/79 (!) 155/55 (!) 158/85 (!) 153/74  Pulse: 74 72 81 88  Resp: Temp:  98.1 F (36.7 C) 98.6 F (37 C) 98.4 F (36.9 C)  TempSrc:  Oral Oral Oral  SpO2: 100% 100% 98% 96%  Weight:      Height:        Intake/Output Summary (Last 24 hours) at 07/28/2022 0759 Last data filed at 07/28/2022 0600 Gross per 24 hour  Intake 460 ml  Output 1000 ml  Net -540 ml    Filed Weights   07/27/22 0643 07/27/22 0853 07/27/22 1256  Weight: 90.2 kg 88.8 kg 87.8 kg    Examination:  General: No distress.  Currently on room air.  Chronically ill and deconditioned looking. ENT/neck: No palpable neck masses or JVD elevation noted  respiratory: Bilateral decreased breath sounds bases with scattered crackles  CVS: Rate mostly controlled; S1  and S2 heard  abdominal: Soft, nontender, distended mildly; no organomegaly, bowel sounds are normally heard  extremities: Right BKA with dressing present; left BKA  CNS: Alert; still slow to respond.  No obvious focal deficits  lymph: No palpable lymphadenopathy noted  skin: No obvious lesions/petechiae  psych: Affect is still mostly flat.  Currently showing no signs of agitation.  Data Reviewed: I have personally reviewed following labs and imaging studies  CBC: Recent Labs  Lab 07/22/22 1832 07/22/22 1903 07/23/22 0337 07/25/22 0218 07/25/22 1359 07/26/22 1018 07/27/22 0801  WBC 7.1  --  8.4 5.5  --   --  7.9  NEUTROABS 6.0  --   --  4.5  --   --   --   HGB 7.9*   < > 7.8* 7.8* 9.7* 10.9* 9.8*  HCT 23.6*   < > 23.6* 23.5* 28.4* 32.0*  29.3*  MCV 88.1  --  90.4 90.4  --   --  89.1  PLT 88*  --  76* 91*  --   --  126*   < > = values in this interval not displayed.    Basic Metabolic Panel: Recent Labs  Lab 07/22/22 1832 07/22/22 1903 07/23/22 0023 07/23/22 0337 07/25/22 0218 07/26/22 1018 07/27/22 0801  NA 132*   < > 129* 130* 135 135 132*  K 4.4   < > 3.9 3.8 3.5 3.7 3.8  CL 89*  --   --  91* 96* 95* 94*  CO2 28  --   --  26 27  --  24  GLUCOSE 125*  --   --  130* 182* 105* 160*  BUN 48*  --   --  49* 50* 32* 49*  CREATININE 7.55*  --   --  8.12* 7.95* 6.50* 7.54*  CALCIUM 8.7*  --   --  7.8* 7.9*  --  7.9*  MG  --   --   --  1.6* 2.0  --   --   PHOS  --   --   --  5.0*  --   --  4.2   < > = values in this interval not displayed.    GFR: Estimated Creatinine Clearance: 9.9 mL/min (A) (by C-G formula based on SCr of 7.54 mg/dL (H)). Liver Function Tests: Recent Labs  Lab 07/22/22 1832 07/27/22 0801  AST 27  --   ALT 16  --   ALKPHOS 169*  --   BILITOT 1.4*  --   PROT 6.3*  --   ALBUMIN 3.9 2.9*    Recent Labs  Lab 07/22/22 1832  LIPASE 82*    No results for input(s): "AMMONIA" in the last 168 hours. Coagulation Profile: Recent Labs  Lab 07/22/22 1832  INR 1.5*    Cardiac Enzymes: No results for input(s): "CKTOTAL", "CKMB", "CKMBINDEX", "TROPONINI" in the last 168 hours. BNP (last 3 results) No results for input(s): "PROBNP" in the last 8760 hours. HbA1C: No results for input(s): "HGBA1C" in the last 72 hours. CBG: Recent Labs  Lab 07/27/22 0720 07/27/22 1325 07/27/22 1619 07/27/22 2120 07/28/22 0722  GLUCAP 155* 109* 128* 158* 129*    Lipid Profile: No results for input(s): "CHOL", "HDL", "LDLCALC", "TRIG", "CHOLHDL", "LDLDIRECT" in the last 72 hours.  Thyroid Function Tests: No results for input(s): "TSH", "T4TOTAL", "FREET4", "T3FREE", "THYROIDAB" in the last 72 hours. Anemia Panel: No results for input(s): "VITAMINB12", "FOLATE", "FERRITIN", "TIBC", "IRON",  "RETICCTPCT" in the last 72 hours. Sepsis Labs: Recent Labs  Lab 07/22/22 1835 07/22/22 2019  LATICACIDVEN 0.6 0.6     Recent Results (from the past 240 hour(s))  Culture, blood (Routine x 2)     Status: None   Collection Time: 07/22/22  6:33 PM   Specimen: BLOOD  Result Value Ref Range Status   Specimen Description   Final    BLOOD BLOOD RIGHT FOREARM Performed at [facility], [address], [place], [place] [address]    Special Requests   Final    Blood Culture adequate volume BOTTLES DRAWN AEROBIC AND ANAEROBIC Performed at [facility], [address] [place], [place] [address]    Culture   Final    NO GROWTH 5 DAYS Performed at [facility], [address] [place], [place] [address]    Report Status 07/27/2022 FINAL  Final  Culture, blood (Routine x 2)     Status: None   Collection Time: 07/22/22  6:35 PM   Specimen: BLOOD  Result Value Ref Range Status   Specimen Description   Final    BLOOD BLOOD RIGHT ARM Performed at [facility], [address] [place], [place] [address]    Special Requests   Final    Blood Culture adequate volume BOTTLES DRAWN AEROBIC AND ANAEROBIC Performed at [facility], [address], [place], [place] [address]    Culture   Final    NO GROWTH 5 DAYS Performed at [facility], [address] [place], [place] [address]    Report Status 07/27/2022 FINAL  Final  Resp panel by RT-PCR (RSV, Flu A&B, Covid) Anterior Nasal Swab     Status: None   Collection Time: 07/22/22  6:36 PM   Specimen: Anterior Nasal Swab  Result Value Ref Range Status   SARS Coronavirus 2 by RT PCR NEGATIVE NEGATIVE Final    Comment: (NOTE) SARS-CoV-2 target nucleic acids are NOT DETECTED.  The SARS-CoV-2 RNA is generally detectable in upper respiratory specimens during the acute phase of infection. The lowest concentration of SARS-CoV-2 viral copies this assay can  detect is 138 copies/mL. A negative result does not preclude SARS-Cov-2 infection and should not be used as the sole basis for treatment or other patient management decisions. A negative result may occur with  improper specimen collection/handling, submission of specimen other than nasopharyngeal swab, presence of viral mutation(s) within the areas targeted by this assay, and inadequate number of viral copies(<138 copies/mL). A negative result must be combined with clinical observations, patient history, and epidemiological information. The expected result is Negative.  Fact Sheet for Patients:  BloggerCourse.com  Fact Sheet for Healthcare Providers:  SeriousBroker.it  This test is no t yet approved or cleared by the [place] FDA and  has been authorized for detection and/or diagnosis of SARS-CoV-2 by FDA under an Emergency Use Authorization (EUA). This EUA will remain  in effect (meaning this test can be used) for the duration of the COVID-19 declaration under Section 564(b)(1) of the Act, 21 U.S.C.section 360bbb-3(b)(1), unless the authorization is terminated  or revoked sooner.       Influenza A by PCR NEGATIVE NEGATIVE Final   Influenza B by PCR NEGATIVE NEGATIVE Final    Comment: (NOTE) The Xpert Xpress SARS-CoV-2/FLU/RSV plus assay is intended as an aid in the diagnosis of influenza from Nasopharyngeal swab specimens and should not be used as a sole basis for treatment. Nasal washings and aspirates are unacceptable for Xpert Xpress SARS-CoV-2/FLU/RSV testing.  Fact Sheet for Patients:  BloggerCourse.com  Fact Sheet for Healthcare Providers: SeriousBroker.it  This test is not yet approved or cleared by the [place] FDA and has been authorized for detection and/or diagnosis of SARS-CoV-2 by FDA under an Emergency Use Authorization (EUA). This EUA will remain in  effect (meaning this test can be used) for the duration of the COVID-19 declaration under Section 564(b)(1) of the Act, 21 U.S.C. section 360bbb-3(b)(1), unless the authorization is terminated or revoked.     Resp Syncytial Virus by PCR NEGATIVE NEGATIVE Final    Comment: (NOTE) Fact Sheet for Patients: BloggerCourse.com  Fact Sheet for Healthcare Providers: SeriousBroker.it  This test is not yet approved or cleared by the [place] FDA and has been authorized for detection and/or diagnosis of SARS-CoV-2 by FDA under an Emergency Use Authorization (EUA). This EUA will remain in effect (meaning this test can be used) for the duration of the COVID-19 declaration under Section 564(b)(1) of the Act, 21 U.S.C. section 360bbb-3(b)(1), unless the authorization is terminated or revoked.  Performed at [facility], [address], [place], [place] [address]   MRSA Next Gen by PCR, Nasal     Status: None   Collection Time: 07/23/22  2:30 AM   Specimen: Nasal Mucosa; Nasal Swab  Result Value Ref Range Status   MRSA by PCR Next Gen NOT DETECTED NOT DETECTED Final    Comment: (NOTE) The GeneXpert MRSA Assay (FDA approved for NASAL specimens only), is one component of a comprehensive MRSA colonization surveillance program. It is not intended to diagnose MRSA infection nor to guide or monitor treatment for MRSA infections. Test performance is not FDA approved in patients less than 26 years old. Performed at [facility], [address] [place], [place] [address]          Radiology Studies: No results found.      Scheduled Meds:  vitamin C  1,000 mg Oral Daily   darbepoetin (ARANESP) injection - DIALYSIS  100 mcg Subcutaneous Q Thu-1800   docusate sodium  100 mg Oral BID   heparin  5,000 Units Subcutaneous Q8H   insulin aspart  0-5 Units Subcutaneous QHS   insulin aspart  0-9 Units Subcutaneous  TID WC   levothyroxine  112 mcg Oral Q0600   nutrition supplement (JUVEN)  1 packet Oral BID BM   pantoprazole  40 mg Oral QHS   polyethylene glycol  17 g Oral Daily   rosuvastatin  10 mg Oral Daily   zinc sulfate  220 mg Oral Daily   Continuous Infusions:  magnesium sulfate bolus IVPB            Glade Lloyd, MD [facility] 07/28/2022, 7:59 AM

## 2022-07-28 NOTE — PMR Pre-admission (Signed)
PMR Admission Coordinator Pre-Admission Assessment  Patient: [name] is an 72 y.o., male MRN: [id number] DOB: 1950/05/09 Height: 5\' 10"  (177.8 cm) Weight: 87.8 kg  Insurance Information HMO:     PPO:      PCP:      IPA:      80/20: yes     OTHER:  PRIMARY: Medicare A & B      Policy#: [id number]      Subscriber: patient CM Name:       Phone#:      Fax#:  Pre-Cert#:       Employer:  Benefits:  Phone #:      Name:  Eff. Date: Part A effective 08/07/14, Part B effective 09/07/18     Deduct: $1,632      Out of Pocket Max: NA      Life Max: NA CIR: 100% coverage      SNF: 100% coverage days 1-20, 80% coverage days 21-100 Outpatient: 80% coverage     Co-Pay: 20% Home Health: 100% coverage      Co-Pay:  DME: 80% coverage     Co-Pay: 20% Providers: pt's choice SECONDARY: BCBS Supplement      Policy#: [id number]     Phone#: [phone]  Financial Counselor:       Phone#:   The "Data Collection Information Summary" for patients in Inpatient Rehabilitation Facilities with attached "Privacy Act Statement-Health Care Records" was provided and verbally reviewed with: {CHL IP Patient Family LK:[id number]}  Emergency Contact Information Contact Information     Name Relation Home Work Mobile   Herman Spouse   [phone]   O[place]wa, Kinyon   [phone]       Current Medical History  Patient Admitting Diagnosis: R BKA History of Present Illness: Pt is a 72 year old male with medical hx significant for: ESRD on home dialysis 3x weekly, HLD, HTN, hypothyroid, prostate CA, CABG, chronic wound right foot, s/p L BKA secondary to osteomyelitis, right great toe amputation. Pt [facility] to [facility] on 07/22/22 d/t c/o AMS, dyspnea, fever. Code sepsis initiated. Pt noted to be hypoxic and in respiratory distress on arrival (60% on room air). Placed on nonrebreather and then CPAP with improvement. CT head and CT chest/abdomen/pelvis negative for acute abnormalities..  Pt continued to have a decline in mental status and respiratory status. Pt emergently intubated. Right foot x-ray revealed cellulitis. Pt transferred to [facility] on 07/23/22. Pt extubated on 07/23/22. Orthopedics consulted. Pt underwent right BKA on 07/26/22 by Dr. Lajoyce Corners. Therapy evaluations  completed and CIR recommended d/t pt's deficits in functional mobility and inability to complete ADLs independently.     Patient's medical record from [facility] has been reviewed by the rehabilitation admission coordinator and physician.  Past Medical History  Past Medical History:  Diagnosis Date   Acute osteomyelitis of metatarsal bone of left foot    Ambulates with cane    straight cane   Anemia    Cervical myelopathy 02/06/2018   Chronic kidney disease    dailysis M W F- home   Community acquired pneumonia of left lower lobe of lung 08/18/2021   Complication of anesthesia    Coronary artery disease    Dehiscence of amputation stump of left lower extremity    Diabetes mellitus without complication    type2   Diabetic foot ulcer    Diabetic neuropathy 02/06/2018   Dysrhythmia    Gait abnormality 08/24/2018   GERD (  gastroesophageal reflux disease)     01/06/20- not current   Gout    History of blood transfusion    History of blood transfusion    History of kidney stones    passed stones   Hypercholesteremia    Hypertension    Hypothyroidism    Neuromuscular disorder    neuropathy left leg and bilateral feet   Neuropathy    Partial nontraumatic amputation of foot, 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   PONV (postoperative nausea and vomiting)    Prostate cancer    PVD (peripheral vascular disease)    with amputations    Has the patient had major surgery during 100 days prior to admission? Yes  Family History   family history includes CAD in his father; Cancer in his father; Diabetes Mellitus II in his brother, brother, father, and mother; Kidney disease in his brother, brother, father,  and mother; Stomach cancer (age of onset: 67) in his brother.  Current Medications  Current Facility-Administered Medications:    acetaminophen (TYLENOL) tablet 650 mg, 650 mg, Oral, Q4H PRN, Nadara Mustard, MD, 650 mg at 07/27/22 1328   alum & mag hydroxide-simeth (MAALOX/MYLANTA) 200-200-20 MG/5ML suspension 15-30 mL, 15-30 mL, Oral, Q2H PRN, Nadara Mustard, MD   ascorbic acid (VITAMIN C) tablet 1,000 mg, 1,000 mg, Oral, Daily, Nadara Mustard, MD, 1,000 mg at 07/28/22 0930   bisacodyl (DULCOLAX) EC tablet 5 mg, 5 mg, Oral, Daily PRN, Nadara Mustard, MD   Darbepoetin Alfa (ARANESP) injection 100 mcg, 100 mcg, Subcutaneous, Q Thu-1800, Nadara Mustard, MD, 100 mcg at 07/25/22 1721   docusate sodium (COLACE) capsule 100 mg, 100 mg, Oral, BID, Nadara Mustard, MD, 100 mg at 07/28/22 0930   guaiFENesin-dextromethorphan (ROBITUSSIN DM) 100-10 MG/5ML syrup 15 mL, 15 mL, Oral, Q4H PRN, Nadara Mustard, MD   heparin injection 5,000 Units, 5,000 Units, Subcutaneous, Q8H, Nadara Mustard, MD, 5,000 Units at 07/27/22 2112   hydrALAZINE (APRESOLINE) injection 5 mg, 5 mg, Intravenous, Q20 Min PRN, Nadara Mustard, MD   HYDROmorphone (DILAUDID) injection 0.5-1 mg, 0.5-1 mg, Intravenous, Q4H PRN, Nadara Mustard, MD   insulin aspart (novoLOG) injection 0-5 Units, 0-5 Units, Subcutaneous, QHS, Hall, Carole N, DO   insulin aspart (novoLOG) injection 0-9 Units, 0-9 Units, Subcutaneous, TID WC, Hall, Carole N, DO, 1 Units at 07/28/22 7829   labetalol (NORMODYNE) injection 10 mg, 10 mg, Intravenous, Q10 min PRN, Nadara Mustard, MD   levothyroxine (SYNTHROID) tablet 112 mcg, 112 mcg, Oral, Q0600, Nadara Mustard, MD, 112 mcg at 07/28/22 0930   magnesium citrate solution 1 Bottle, 1 Bottle, Oral, Once PRN, Nadara Mustard, MD   magnesium sulfate IVPB 2 g 50 mL, 2 g, Intravenous, Daily PRN, Nadara Mustard, MD   metoprolol tartrate (LOPRESSOR) injection 2-5 mg, 2-5 mg, Intravenous, Q2H PRN, Nadara Mustard, MD   nutrition  supplement (JUVEN) (JUVEN) powder packet 1 packet, 1 packet, Oral, BID BM, Nadara Mustard, MD, 1 packet at 07/28/22 0930   ondansetron (ZOFRAN) injection 4 mg, 4 mg, Intravenous, Q6H PRN, Nadara Mustard, MD   Oral care mouth rinse, 15 mL, Mouth Rinse, PRN, Nadara Mustard, MD   Oral care mouth rinse, 15 mL, Mouth Rinse, PRN, Hanley Ben, Kshitiz, MD   oxyCODONE (Oxy IR/ROXICODONE) immediate release tablet 10-15 mg, 10-15 mg, Oral, Q4H PRN, Nadara Mustard, MD, 10 mg at 07/27/22 2215   oxyCODONE (Oxy IR/ROXICODONE) immediate release tablet 5-10 mg, 5-10 mg, Oral, Q4H PRN,  Nadara Mustard, MD, 10 mg at 07/27/22 0352   pantoprazole (PROTONIX) EC tablet 40 mg, 40 mg, Oral, QHS, Nadara Mustard, MD, 40 mg at 07/27/22 2114   phenol (CHLORASEPTIC) mouth spray 1 spray, 1 spray, Mouth/Throat, PRN, Nadara Mustard, MD   polyethylene glycol (MIRALAX / GLYCOLAX) packet 17 g, 17 g, Oral, Daily, Nadara Mustard, MD   polyethylene glycol (MIRALAX / GLYCOLAX) packet 17 g, 17 g, Oral, Daily PRN, Nadara Mustard, MD   potassium chloride SA (KLOR-CON M) CR tablet 20-40 mEq, 20-40 mEq, Oral, Daily PRN, Nadara Mustard, MD   rosuvastatin (CRESTOR) tablet 10 mg, 10 mg, Oral, Daily, Nadara Mustard, MD, 10 mg at 07/28/22 0930   traZODone (DESYREL) tablet 50 mg, 50 mg, Oral, QHS PRN, Nadara Mustard, MD, 50 mg at 07/27/22 2114   zinc sulfate capsule 220 mg, 220 mg, Oral, Daily, Nadara Mustard, MD, 220 mg at 07/28/22 0930  Patients Current Diet:  Diet Order             Diet regular Room service appropriate? Yes; Fluid consistency: Thin  Diet effective now                   Precautions / Restrictions Precautions Precautions: Fall, Other (comment) Precaution Comments: R LE wound vac Restrictions Weight Bearing Restrictions: Yes RLE Weight Bearing: Non weight bearing   Has the patient had 2 or more falls or a fall with injury in the past year? Yes  Prior Activity Level    Prior Functional Level Self Care: Did the  patient need help bathing, dressing, using the toilet or eating? Independent  Indoor Mobility: Did the patient need assistance with walking from room to room (with or without device)? Independent  Stairs: Did the patient need assistance with internal or external stairs (with or without device)? Independent (wife reported she provides supervision)  Functional Cognition: Did the patient need help planning regular tasks such as shopping or remembering to take medications? Independent  Patient Information    Patient's Response To:     Home Assistive Devices / Equipment Home Assistive Devices/Equipment: CBG Meter, Eyeglasses, Prosthesis, Scales, Wheelchair Home Equipment: Rollator (4 wheels), Shower seat, Cane - single point, Agricultural consultant (2 wheels), Wheelchair - manual, BSC/3in1  Prior Device Use: Indicate devices/aids used by the patient prior to current illness, exacerbation or injury? Manual wheelchair, Walker, and Orthotics/Prosthetics  Current Functional Level Cognition  Overall Cognitive Status: No family/caregiver present to determine baseline cognitive functioning Current Attention Level: Sustained Orientation Level: Oriented X4 Following Commands: Follows one step commands with increased time Safety/Judgement: Decreased awareness of safety General Comments: pleasant, easily distracted and does benefit from cues for problem solving and staying on task. appears somewhat confused. sighing throughout session though discussed with RN and likely due to mental health concerns w/ new amputation.    Extremity Assessment (includes Sensation/Coordination)  Upper Extremity Assessment: Overall WFL for tasks assessed  Lower Extremity Assessment: Defer to PT evaluation RLE Deficits / Details: In shriker sock, wound vac on, very minimal drainage in canister. Guarding and weakness as expected post-op. LLE Deficits / Details: Hx prior BKA. Has prosthesis and able to WB through LLE using RW.     ADLs  Overall ADL's : Needs assistance/impaired Eating/Feeding: Set up, Sitting Grooming: Set up, Sitting Upper Body Bathing: Set up, Sitting Lower Body Bathing: Moderate assistance, Sitting/lateral leans, Sit to/from stand Upper Body Dressing : Set up, Sitting Lower Body Dressing: Maximal assistance, Sitting/lateral  leans, Sit to/from stand Lower Body Dressing Details (indicate cue type and reason): Min A for prosthetic mgmt with pt difficulty lining up correctly Toileting- Clothing Manipulation and Hygiene: Maximal assistance, Sitting/lateral lean, Sit to/from stand General ADL Comments: Limited d/t expected deficits with BKA, complicated by pt prior L BKA w/ prosthetic increasing fall risk with ADLs/transfers.    Mobility  Overal bed mobility: Needs Assistance Bed Mobility: Supine to Sit Supine to sit: Min guard General bed mobility comments: EOB on entry    Transfers  Overall transfer level: Needs assistance Equipment used: Rolling walker (2 wheels), None Transfers: Sit to/from Stand, Bed to chair/wheelchair/BSC Sit to Stand: Mod assist, From elevated surface Bed to/from chair/wheelchair/BSC transfer type:: Lateral/scoot transfer Stand pivot transfers: Mod assist, From elevated surface  Lateral/Scoot Transfers: Min assist General transfer comment: Mod A to stand w/ elevated bed. Cues for posture and pt reporting unable to maintain standing too long. Attempted squat pivot though pt initiated and did not clear bottom well enough so dropped armrest with pt able to scoot to recliner w/ Min A.    Ambulation / Gait / Stairs / Wheelchair Mobility  Ambulation/Gait Pre-gait activities: weight shift from arms to Lt prosthetic, able to clear foot and briefly maintain WB through UEs only on walker.    Posture / Balance Dynamic Sitting Balance Sitting balance - Comments: Sat EOB with min guard supervision, was able to donne prosthesis on Lt independently. Balance Overall balance  assessment: Needs assistance Sitting-balance support: No upper extremity supported, Feet supported Sitting balance-Leahy Scale: Good Sitting balance - Comments: Sat EOB with min guard supervision, was able to donne prosthesis on Lt independently. Standing balance support: Bilateral upper extremity supported, During functional activity Standing balance-Leahy Scale: Poor    Special needs/care consideration Dialysis: Hemodialysis Tuesday, Thursday, and Saturday, Wound Vac leg/right, Skin Stage 1 Pressure injury: sacrum; Surgical incision: leg/right, and Diabetic management Novolog 0-5 units daily at bedtime; Novolog 0-9  units 3x daily with meals   Previous Home Environment (from acute therapy documentation) Living Arrangements: Spouse/significant other Available Help at Discharge: Family, Other (Comment) Type of Home: House Home Layout: Able to live on main level with bedroom/bathroom Home Access: Ramped entrance Bathroom Shower/Tub: Health visitor: Standard Bathroom Accessibility: No Home Care Services: No Additional Comments: wife sets up HD at home  Discharge Living Setting Does the patient have any problems obtaining your medications?: No  Social/Family/Support Systems    Goals    Decrease burden of Care through IP rehab admission: NA  Possible need for SNF placement upon discharge: Not ancipiated  Patient Condition: I have reviewed medical records from [facility], spoken with CM, and patient and spouse. I met with patient at the bedside and discussed via phone for inpatient rehabilitation assessment.  Patient will benefit from ongoing PT and OT, can actively participate in 3 hours of therapy a day 5 days of the week, and can make measurable gains during the admission.  Patient will also benefit from the coordinated team approach during an Inpatient Acute Rehabilitation admission.  The patient will receive intensive therapy as well as Rehabilitation  physician, nursing, social worker, and care management interventions.  Due to safety, skin/wound care, disease management, medication administration, pain management, and patient education the patient requires 24 hour a day rehabilitation nursing.  The patient is currently *** with mobility and basic ADLs.  Discharge setting and therapy post discharge at home with home health is anticipated.  Patient has agreed to participate in  the Acute Inpatient Rehabilitation Program and will admit {Time; today/tomorrow:10263}.  Preadmission Screen Completed By:  Domingo Pulse, 07/28/2022 11:11 AM ______________________________________________________________________   Discussed status with Dr. Marland Kitchen on *** at *** and received approval for admission today.  Admission Coordinator:  Domingo Pulse, CCC-SLP, time ***/Date ***   Assessment/Plan: Diagnosis: Does the need for close, 24 hr/day Medical supervision in concert with the patient's rehab needs make it unreasonable for this patient to be served in a less intensive setting? {yes_no_potentially:3041433} Co-Morbidities requiring supervision/potential complications: *** Due to {due ZO:1096045}, does the patient require 24 hr/day rehab nursing? {yes_no_potentially:3041433} Does the patient require coordinated care of a physician, rehab nurse, PT, OT, and SLP to address physical and functional deficits in the context of the above medical diagnosis(es)? {yes_no_potentially:3041433} Addressing deficits in the following areas: {deficits:3041436} Can the patient actively participate in an intensive therapy program of at least 3 hrs of therapy 5 days a week? {yes_no_potentially:3041433} The potential for patient to make measurable gains while on inpatient rehab is {potential:3041437} Anticipated functional outcomes upon discharge from inpatient rehab: {functional outcomes:304600100} PT, {functional outcomes:304600100} OT, {functional outcomes:304600100}  SLP Estimated rehab length of stay to reach the above functional goals is: *** Anticipated discharge destination: {anticipated dc setting:21604} 10. Overall Rehab/Functional Prognosis: {potential:3041437}   MD Signature: ***

## 2022-07-28 NOTE — Progress Notes (Signed)
Inpatient Rehab Admissions:  Inpatient Rehab Consult received.  I met with patient at the bedside for rehabilitation assessment and to discuss goals and expectations of an inpatient rehab admission.  Pt appeared somewhat confused. However able to participate in discussion about  average length of stay and discharge home after completion of CIR. Pt acknowledged understanding. Pt interested in pursuing CIR. Pt gave permission to contact wife Aurea Graff. Spoke with Aurea Graff on the telephone. She also acknowledged understanding of CIR goals and expectations. She is supportive of pt pursuing CIR. She confirmed that she will be available to provide assistance for pt after discharge. She is also planning to hire additional help. Will continue to follow.  Signed: Wolfgang Phoenix, MS, CCC-SLP Admissions Coordinator [phone]

## 2022-07-28 NOTE — Evaluation (Signed)
Occupational Therapy Evaluation Patient Details Name: [name] MRN: [id number] DOB: [birth year] Today's Date: 07/28/2022   History of Present Illness 72 year old male admitted 4/15 with with altered mental status/weakness and hypoxia, found to be septic. S/p right below knee amputation 4/19. PMH of hypertension, hyperlipidemia, CAD, diabetes mellitus type 2, peripheral vascular disease, Left BKA, end-stage renal disease on home hemodialysis, hypothyroidism, prostate cancer.   Clinical Impression   PTA, pt lives with spouse, typically Modified Independent with ADLs/mobility using L LE prosthetic and RW. Recently using w/c due to RLE WB restrictions leading up to BKA this admission. Pt presents now with deficits in cognition, standing balance and overall strength. Pt eager to get OOB though noted w/ some slower processing and decreased overall awareness (does endorse feeling overwhelmed w/ another amputation). Overall, pt requires Mod A to stand with RW though unable to pivot in this manner and ultimately performed scoot transfer to drop arm recliner w/ Min A. Pt requires Setup for UB ADL and up to Max A for LB ADLs d/t deficits. Based on high PLOF and pt reported motivation to regain independence, recommend consideration of intensive rehab services.      Recommendations for follow up therapy are one component of a multi-disciplinary discharge planning process, led by the attending physician.  Recommendations may be updated based on patient status, additional functional criteria and insurance authorization.   Assistance Recommended at Discharge Frequent or constant Supervision/Assistance  Patient can return home with the following A lot of help with walking and/or transfers;A lot of help with bathing/dressing/bathroom    Functional Status Assessment  Patient has had a recent decline in their functional status and demonstrates the ability to make significant improvements in function in a reasonable  and predictable amount of time.  Equipment Recommendations  None recommended by OT    Recommendations for Other Services Rehab consult     Precautions / Restrictions Precautions Precautions: Fall;Other (comment) Precaution Comments: R LE wound vac Restrictions Weight Bearing Restrictions: Yes RLE Weight Bearing: Non weight bearing      Mobility Bed Mobility               General bed mobility comments: EOB on entry    Transfers Overall transfer level: Needs assistance Equipment used: Rolling walker (2 wheels), None Transfers: Sit to/from Stand, Bed to chair/wheelchair/BSC Sit to Stand: Mod assist, From elevated surface          Lateral/Scoot Transfers: Min assist General transfer comment: Mod A to stand w/ elevated bed. Cues for posture and pt reporting unable to maintain standing too long. Attempted squat pivot though pt initiated and did not clear bottom well enough so dropped armrest with pt able to scoot to recliner w/ Min A.      Balance Overall balance assessment: Needs assistance Sitting-balance support: No upper extremity supported, Feet supported Sitting balance-Leahy Scale: Good     Standing balance support: Bilateral upper extremity supported, During functional activity Standing balance-Leahy Scale: Poor                             ADL either performed or assessed with clinical judgement   ADL Overall ADL's : Needs assistance/impaired Eating/Feeding: Set up;Sitting   Grooming: Set up;Sitting   Upper Body Bathing: Set up;Sitting   Lower Body Bathing: Moderate assistance;Sitting/lateral leans;Sit to/from stand   Upper Body Dressing : Set up;Sitting   Lower Body Dressing: Maximal assistance;Sitting/lateral leans;Sit to/from stand Lower Body Dressing  Details (indicate cue type and reason): Min A for prosthetic mgmt with pt difficulty lining up correctly     Toileting- Clothing Manipulation and Hygiene: Maximal  assistance;Sitting/lateral lean;Sit to/from stand         General ADL Comments: Limited d/t expected deficits with BKA, complicated by pt prior L BKA w/ prosthetic increasing fall risk with ADLs/transfers.     Vision   Vision Assessment?: No apparent visual deficits     Perception     Praxis      Pertinent Vitals/Pain Pain Assessment Pain Assessment: No/denies pain     Hand Dominance Right   Extremity/Trunk Assessment Upper Extremity Assessment Upper Extremity Assessment: Overall WFL for tasks assessed   Lower Extremity Assessment Lower Extremity Assessment: Defer to PT evaluation       Communication Communication Communication: No difficulties   Cognition Arousal/Alertness: Awake/alert Behavior During Therapy: WFL for tasks assessed/performed, Restless Overall Cognitive Status: No family/caregiver present to determine baseline cognitive functioning Area of Impairment: Attention, Following commands, Safety/judgement, Awareness, Problem solving                   Current Attention Level: Sustained   Following Commands: Follows one step commands with increased time Safety/Judgement: Decreased awareness of safety Awareness: Emergent Problem Solving: Slow processing, Requires verbal cues General Comments: pleasant, easily distracted and does benefit from cues for problem solving and staying on task. appears somewhat confused. sighing throughout session though discussed with RN and likely due to mental health concerns w/ new amputation.     General Comments       Exercises     Shoulder Instructions      Home Living Family/patient expects to be discharged to:: Private residence Living Arrangements: Spouse/significant other Available Help at Discharge: Family;Other (Comment) Type of Home: House Home Access: Ramped entrance     Home Layout: Able to live on main level with bedroom/bathroom     Bathroom Shower/Tub: Producer, television/film/video:  Standard Bathroom Accessibility: No   Home Equipment: Rollator (4 wheels);Shower seat;Cane - single Librarian, academic (2 wheels);Wheelchair - manual;BSC/3in1   Additional Comments: wife sets up HD at home      Prior Functioning/Environment Prior Level of Function : Needs assist             Mobility Comments: Pt could walk with rollator, was recently on restrictions and not allowed to walk due to RLE wound. ADLs Comments: Wife would assist with set-up for bath but did not physical help. Toilets himself. Ind with dressing.        OT Problem List: Decreased strength;Decreased activity tolerance;Impaired balance (sitting and/or standing);Decreased cognition      OT Treatment/Interventions: Self-care/ADL training;Therapeutic exercise;Energy conservation;DME and/or AE instruction;Therapeutic activities;Patient/family education;Balance training    OT Goals(Current goals can be found in the care plan section) Acute Rehab OT Goals Patient Stated Goal: go to rehab here, get back independence, get R LE prosthetic OT Goal Formulation: With patient Time For Goal Achievement: 08/11/22 Potential to Achieve Goals: Good  OT Frequency: Min 2X/week    Co-evaluation              AM-PAC OT "6 Clicks" Daily Activity     Outcome Measure Help from another person eating meals?: A Little Help from another person taking care of personal grooming?: A Little Help from another person toileting, which includes using toliet, bedpan, or urinal?: A Lot Help from another person bathing (including washing, rinsing, drying)?: A Lot Help from another person  to put on and taking off regular upper body clothing?: A Little Help from another person to put on and taking off regular lower body clothing?: A Lot 6 Click Score: 15   End of Session Equipment Utilized During Treatment: Rolling walker (2 wheels);Gait belt Nurse Communication: Mobility status  Activity Tolerance: Patient tolerated treatment  well Patient left: in chair;with call bell/phone within reach;with chair alarm set  OT Visit Diagnosis: Unsteadiness on feet (R26.81);Other abnormalities of gait and mobility (R26.89)                Time: 0926-1000 OT Time Calculation (min): 34 min Charges:  OT General Charges $OT Visit: 1 Visit OT Evaluation $OT Eval Moderate Complexity: 1 Mod OT Treatments $Therapeutic Activity: 8-22 mins  Bradd Canary, OTR/L Acute Rehab Services Office: [phone]   Lorre Munroe 07/28/2022, 10:43 AM

## 2022-07-28 NOTE — Progress Notes (Signed)
PROGRESS NOTE    [name]  ZOX:[id number] DOB: [birth year] DOA: 07/22/2022 PCP: Adolph Pollack, FNP   Brief Narrative:  72 year old male with history of hypertension, hyperlipidemia, CAD, diabetes mellitus type 2, peripheral vascular disease, diabetic foot wounds requiring amputations, end-stage renal disease on home hemodialysis, hypothyroidism, prostate cancer presented with altered mental status/weakness and hypoxia.  He required BiPAP on presentation with initial improvement but later his mental status deteriorated and patient subsequently got intubated.  COVID-19/influenza/RSV PCR were negative.  Chest x-ray was unremarkable.  Right foot x-ray showed subcutaneous gas within the soft tissues lateral and plantar to the fifth tarsometatarsal joint, reflective of local trauma and/or infection.  CT head showed no acute intracranial abnormality.  CT chest/abdomen/pelvis showed no acute abnormality except for mildly nodular hepatic contour (?  Cirrhosis), small volume ascites.  Nephrology and orthopedics were consulted.  He was extubated on 07/23/2022.  He was transferred to [facility] service from 07/24/2022 onwards.  He underwent right below-knee amputation on 07/26/2022 by Dr. Lajoyce Corners.  Assessment & Plan:   Acute respiratory failure with hypoxia -Required intubation on presentation.  Extubated on 07/23/2022. - transferred to [facility] service from 07/24/2022 onwards. -Currently on room air.  Sepsis: Present on admission Right lower extremity cellulitis -Right foot x-ray showed subcutaneous gas within the soft tissues lateral and plantar to the fifth tarsometatarsal joint, reflective of local trauma and/or infection. --Orthopedics/Dr. Lajoyce Corners following: underwent right below-knee amputation on 07/26/2022.  Wound care as per orthopedics recommendations.  Cultures negative so far.  Antibiotics have already been discontinued postoperatively as per orthopedics recommendations. -PT recommended CIR placement.  CIR has  been consulted.  Acute metabolic encephalopathy -Secondary to above.  Mental status much improved.  Monitor mental status.  CT head unremarkable.  End-stage renal disease on home hemodialysis -Nephrology following.  Dialysis as per nephrology schedule.  Hyponatremia -Managed by dialysis by nephrology.  Improved.  Monitor intermittently.  Anemia of chronic disease -From renal failure.  Hemoglobin currently stable.  Monitor intermittently.  Hypomagnesemia -Improved.  No labs today  Thrombocytopenia -Questionable cause.  Improved.  Monitor intermittently.  Diabetic peripheral neuropathy Peripheral vascular disease Status post amputations (left BKA, right toe/ray amputation) -Lyrica on hold for now.  Continue supportive care.  Pain management. -Follow orthopedics recommendations.  Hypothyroidism -Continue levothyroxine  Hyperlipidemia -Continue statin  Diabetes mellitus type 2 -Continue CBGs with SSI.  Blood sugars currently stable.  Hypertension CAD -Blood pressure currently stable.  Home antihypertensives on hold.  Might have to resume if blood pressure remains elevated.  Outpatient follow-up with cardiology.  GERD -Continue PPI  Physical deconditioning -PT recommends inpatient rehab.  CIR consulted.    DVT prophylaxis: Subcutaneous heparin Code Status: Full Family Communication: None at bedside Disposition Plan: Status is: Inpatient Remains inpatient appropriate because: Of severity of illness.  Currently medically stable for discharge to CIR.  Consultants: PCCM/nephrology/orthopedics  Procedures: Intubation/extubation  Antimicrobials:  Anti-infectives (From admission, onward)    Start     Dose/Rate Route Frequency Ordered Stop   07/26/22 1400  ceFAZolin (ANCEF) IVPB 2g/100 mL premix        2 g 200 mL/hr over 30 Minutes Intravenous Every 8 hours 07/26/22 1220 07/26/22 2215   07/25/22 2200  vancomycin (VANCOCIN) IVPB 1000 mg/200 mL premix  Status:   Discontinued        1,000 mg 200 mL/hr over 60 Minutes Intravenous Every T-Th-Sa (Hemodialysis) 07/23/22 1147 07/24/22 0736   07/25/22 1200  vancomycin (VANCOCIN) IVPB 1000 mg/200 mL premix  Status:  Discontinued        1,000 mg 200 mL/hr over 60 Minutes Intravenous Every T-Th-Sa (Hemodialysis) 07/24/22 0736 07/26/22 1237   07/24/22 1800  ceFEPIme (MAXIPIME) 1 g in sodium chloride 0.9 % 100 mL IVPB  Status:  Discontinued        1 g 200 mL/hr over 30 Minutes Intravenous Every 24 hours 07/22/22 2135 07/23/22 1147   07/24/22 0830  vancomycin (VANCOCIN) IVPB 1000 mg/200 mL premix        1,000 mg 200 mL/hr over 60 Minutes Intravenous  Once 07/24/22 0734 07/24/22 1006   07/23/22 2000  ceFEPIme (MAXIPIME) 1 g in sodium chloride 0.9 % 100 mL IVPB  Status:  Discontinued        1 g 200 mL/hr over 30 Minutes Intravenous Every 24 hours 07/23/22 1147 07/26/22 1237   07/23/22 1030  metroNIDAZOLE (FLAGYL) IVPB 500 mg  Status:  Discontinued        500 mg 100 mL/hr over 60 Minutes Intravenous Every 12 hours 07/23/22 0939 07/26/22 1237   07/23/22 0909  vancomycin variable dose per unstable renal function (pharmacist dosing)  Status:  Discontinued         Does not apply See admin instructions 07/23/22 0909 07/23/22 1147   07/22/22 1945  vancomycin (VANCOCIN) IVPB 1000 mg/200 mL premix       See Hyperspace for full Linked Orders Report.   1,000 mg 200 mL/hr over 60 Minutes Intravenous  Once 07/22/22 1842 07/22/22 2158   07/22/22 1845  ceFEPIme (MAXIPIME) 2 g in sodium chloride 0.9 % 100 mL IVPB        2 g 200 mL/hr over 30 Minutes Intravenous  Once 07/22/22 1838 07/22/22 1936   07/22/22 1845  metroNIDAZOLE (FLAGYL) IVPB 500 mg        500 mg 100 mL/hr over 60 Minutes Intravenous  Once 07/22/22 1838 07/22/22 1955   07/22/22 1845  vancomycin (VANCOCIN) IVPB 1000 mg/200 mL premix  Status:  Discontinued        1,000 mg 200 mL/hr over 60 Minutes Intravenous  Once 07/22/22 1838 07/22/22 1841   07/22/22 1845   vancomycin (VANCOCIN) IVPB 1000 mg/200 mL premix       See Hyperspace for full Linked Orders Report.   1,000 mg 200 mL/hr over 60 Minutes Intravenous  Once 07/22/22 1842 07/22/22 2158        Subjective: Patient seen and examined at bedside.  Complains of intermittent right lower extremity pain.  No fever, chest pain, worsening shortness of breath reported.   Objective: Vitals:   07/27/22 1623 07/27/22 2035 07/28/22 0332 07/28/22 0907  BP: (!) 155/55 (!) 158/85 (!) 153/74 (!) 148/60  Pulse: 72 81 88 77  Resp: 16 18 17 16   Temp: 98.1 F (36.7 C) 98.6 F (37 C) 98.4 F (36.9 C) 98.4 F (36.9 C)  TempSrc: Oral Oral Oral Oral  SpO2: 100% 98% 96% 98%  Weight:      Height:        Intake/Output Summary (Last 24 hours) at 07/28/2022 0937 Last data filed at 07/28/2022 0900 Gross per 24 hour  Intake 680 ml  Output 1000 ml  Net -320 ml    Filed Weights   07/27/22 0643 07/27/22 0853 07/27/22 1256  Weight: 90.2 kg 88.8 kg 87.8 kg    Examination:  General: No distress.  Currently on room air.  Chronically ill and deconditioned looking. ENT/neck: No palpable neck masses or JVD elevation noted  respiratory: Bilateral decreased breath  sounds bases with scattered crackles  CVS: Rate mostly controlled; S1 and S2 heard  abdominal: Soft, nontender, distended mildly; no organomegaly, bowel sounds are normally heard  extremities: Right BKA with dressing present; left BKA  CNS: Alert; still slow to respond.  No obvious focal deficits  lymph: No palpable lymphadenopathy noted  skin: No obvious lesions/petechiae  psych: Affect is still mostly flat.  Currently showing no signs of agitation.  Data Reviewed: I have personally reviewed following labs and imaging studies  CBC: Recent Labs  Lab 07/22/22 1832 07/22/22 1903 07/23/22 0337 07/25/22 0218 07/25/22 1359 07/26/22 1018 07/27/22 0801  WBC 7.1  --  8.4 5.5  --   --  7.9  NEUTROABS 6.0  --   --  4.5  --   --   --   HGB 7.9*   <  > 7.8* 7.8* 9.7* 10.9* 9.8*  HCT 23.6*   < > 23.6* 23.5* 28.4* 32.0* 29.3*  MCV 88.1  --  90.4 90.4  --   --  89.1  PLT 88*  --  76* 91*  --   --  126*   < > = values in this interval not displayed.    Basic Metabolic Panel: Recent Labs  Lab 07/22/22 1832 07/22/22 1903 07/23/22 0023 07/23/22 0337 07/25/22 0218 07/26/22 1018 07/27/22 0801  NA 132*   < > 129* 130* 135 135 132*  K 4.4   < > 3.9 3.8 3.5 3.7 3.8  CL 89*  --   --  91* 96* 95* 94*  CO2 28  --   --  26 27  --  24  GLUCOSE 125*  --   --  130* 182* 105* 160*  BUN 48*  --   --  49* 50* 32* 49*  CREATININE 7.55*  --   --  8.12* 7.95* 6.50* 7.54*  CALCIUM 8.7*  --   --  7.8* 7.9*  --  7.9*  MG  --   --   --  1.6* 2.0  --   --   PHOS  --   --   --  5.0*  --   --  4.2   < > = values in this interval not displayed.    GFR: Estimated Creatinine Clearance: 9.9 mL/min (A) (by C-G formula based on SCr of 7.54 mg/dL (H)). Liver Function Tests: Recent Labs  Lab 07/22/22 1832 07/27/22 0801  AST 27  --   ALT 16  --   ALKPHOS 169*  --   BILITOT 1.4*  --   PROT 6.3*  --   ALBUMIN 3.9 2.9*    Recent Labs  Lab 07/22/22 1832  LIPASE 82*    No results for input(s): "AMMONIA" in the last 168 hours. Coagulation Profile: Recent Labs  Lab 07/22/22 1832  INR 1.5*    Cardiac Enzymes: No results for input(s): "CKTOTAL", "CKMB", "CKMBINDEX", "TROPONINI" in the last 168 hours. BNP (last 3 results) No results for input(s): "PROBNP" in the last 8760 hours. HbA1C: No results for input(s): "HGBA1C" in the last 72 hours. CBG: Recent Labs  Lab 07/27/22 0720 07/27/22 1325 07/27/22 1619 07/27/22 2120 07/28/22 0722  GLUCAP 155* 109* 128* 158* 129*    Lipid Profile: No results for input(s): "CHOL", "HDL", "LDLCALC", "TRIG", "CHOLHDL", "LDLDIRECT" in the last 72 hours.  Thyroid Function Tests: No results for input(s): "TSH", "T4TOTAL", "FREET4", "T3FREE", "THYROIDAB" in the last 72 hours. Anemia Panel: No results for  input(s): "VITAMINB12", "FOLATE", "FERRITIN", "TIBC", "IRON", "  RETICCTPCT" in the last 72 hours. Sepsis Labs: Recent Labs  Lab 07/22/22 1835 07/22/22 2019  LATICACIDVEN 0.6 0.6     Recent Results (from the past 240 hour(s))  Culture, blood (Routine x 2)     Status: None   Collection Time: 07/22/22  6:33 PM   Specimen: BLOOD  Result Value Ref Range Status   Specimen Description   Final    BLOOD BLOOD RIGHT FOREARM Performed at [facility], [address], [place], [place] [address]    Special Requests   Final    Blood Culture adequate volume BOTTLES DRAWN AEROBIC AND ANAEROBIC Performed at [facility], [address], [place], [place] [address]    Culture   Final    NO GROWTH 5 DAYS Performed at [facility], [address] [place], [place] [address]    Report Status 07/27/2022 FINAL  Final  Culture, blood (Routine x 2)     Status: None   Collection Time: 07/22/22  6:35 PM   Specimen: BLOOD  Result Value Ref Range Status   Specimen Description   Final    BLOOD BLOOD RIGHT ARM Performed at [facility], [address] [place], [place] [address]    Special Requests   Final    Blood Culture adequate volume BOTTLES DRAWN AEROBIC AND ANAEROBIC Performed at [facility], [address], [place], [place] [address]    Culture   Final    NO GROWTH 5 DAYS Performed at [facility], [address], [place], [place] [address]    Report Status 07/27/2022 FINAL  Final  Resp panel by RT-PCR (RSV, Flu A&B, Covid) Anterior Nasal Swab     Status: None   Collection Time: 07/22/22  6:36 PM   Specimen: Anterior Nasal Swab  Result Value Ref Range Status   SARS Coronavirus 2 by RT PCR NEGATIVE NEGATIVE Final    Comment: (NOTE) SARS-CoV-2 target nucleic acids are NOT DETECTED.  The SARS-CoV-2 RNA is generally detectable in upper respiratory specimens during the acute phase of infection. The  lowest concentration of SARS-CoV-2 viral copies this assay can detect is 138 copies/mL. A negative result does not preclude SARS-Cov-2 infection and should not be used as the sole basis for treatment or other patient management decisions. A negative result may occur with  improper specimen collection/handling, submission of specimen other than nasopharyngeal swab, presence of viral mutation(s) within the areas targeted by this assay, and inadequate number of viral copies(<138 copies/mL). A negative result must be combined with clinical observations, patient history, and epidemiological information. The expected result is Negative.  Fact Sheet for Patients:  BloggerCourse.com  Fact Sheet for Healthcare Providers:  SeriousBroker.it  This test is no t yet approved or cleared by the [place] FDA and  has been authorized for detection and/or diagnosis of SARS-CoV-2 by FDA under an Emergency Use Authorization (EUA). This EUA will remain  in effect (meaning this test can be used) for the duration of the COVID-19 declaration under Section 564(b)(1) of the Act, 21 U.S.C.section 360bbb-3(b)(1), unless the authorization is terminated  or revoked sooner.       Influenza A by PCR NEGATIVE NEGATIVE Final   Influenza B by PCR NEGATIVE NEGATIVE Final    Comment: (NOTE) The Xpert Xpress SARS-CoV-2/FLU/RSV plus assay is intended as an aid in the diagnosis of influenza from Nasopharyngeal swab specimens and should not be used as a sole basis for treatment. Nasal washings and aspirates are  unacceptable for Xpert Xpress SARS-CoV-2/FLU/RSV testing.  Fact Sheet for Patients: BloggerCourse.com  Fact Sheet for Healthcare Providers: SeriousBroker.it  This test is not yet approved or cleared by the [place] FDA and has been authorized for detection and/or diagnosis of SARS-CoV-2 by FDA under  an Emergency Use Authorization (EUA). This EUA will remain in effect (meaning this test can be used) for the duration of the COVID-19 declaration under Section 564(b)(1) of the Act, 21 U.S.C. section 360bbb-3(b)(1), unless the authorization is terminated or revoked.     Resp Syncytial Virus by PCR NEGATIVE NEGATIVE Final    Comment: (NOTE) Fact Sheet for Patients: BloggerCourse.com  Fact Sheet for Healthcare Providers: SeriousBroker.it  This test is not yet approved or cleared by the [place] FDA and has been authorized for detection and/or diagnosis of SARS-CoV-2 by FDA under an Emergency Use Authorization (EUA). This EUA will remain in effect (meaning this test can be used) for the duration of the COVID-19 declaration under Section 564(b)(1) of the Act, 21 U.S.C. section 360bbb-3(b)(1), unless the authorization is terminated or revoked.  Performed at [facility], [address] [place], [place] [address]   MRSA Next Gen by PCR, Nasal     Status: None   Collection Time: 07/23/22  2:30 AM   Specimen: Nasal Mucosa; Nasal Swab  Result Value Ref Range Status   MRSA by PCR Next Gen NOT DETECTED NOT DETECTED Final    Comment: (NOTE) The GeneXpert MRSA Assay (FDA approved for NASAL specimens only), is one component of a comprehensive MRSA colonization surveillance program. It is not intended to diagnose MRSA infection nor to guide or monitor treatment for MRSA infections. Test performance is not FDA approved in patients less than 40 years old. Performed at [facility], [address], [place], [place] [address]          Radiology Studies: No results found.      Scheduled Meds:  vitamin C  1,000 mg Oral Daily   darbepoetin (ARANESP) injection - DIALYSIS  100 mcg Subcutaneous Q Thu-1800   docusate sodium  100 mg Oral BID   heparin  5,000 Units Subcutaneous Q8H   insulin aspart  0-5  Units Subcutaneous QHS   insulin aspart  0-9 Units Subcutaneous TID WC   levothyroxine  112 mcg Oral Q0600   nutrition supplement (JUVEN)  1 packet Oral BID BM   pantoprazole  40 mg Oral QHS   polyethylene glycol  17 g Oral Daily   rosuvastatin  10 mg Oral Daily   zinc sulfate  220 mg Oral Daily   Continuous Infusions:  magnesium sulfate bolus IVPB            Glade Lloyd, MD [facility] 07/28/2022, 9:37 AM

## 2022-07-28 NOTE — Progress Notes (Signed)
Occupational Therapy Treatment  OT requested back by nursing staff to assist pt in safe transfer back to bed. Pt with decreased attention to tasks, often requiring repetition of cues and questions. However, pt able to demo improvements to min guard for scoot transfer back to bed towards L side this PM. Pt's wife present, hopeful for rehab at DC to maximize independence. Both pt/wife also hopeful to obtain hired assistance for pt at home after rehab stay.   07/28/22 1404  OT Visit Information  Last OT Received On 07/28/22  Assistance Needed +2 (for RW/gait progression; +1 scoot transfers)  History of Present Illness 72 year old male admitted 4/15 with with altered mental status/weakness and hypoxia, found to be septic. S/p right below knee amputation 4/19. PMH of hypertension, hyperlipidemia, CAD, diabetes mellitus type 2, peripheral vascular disease, Left BKA, end-stage renal disease on home hemodialysis, hypothyroidism, prostate cancer.  Precautions  Precautions Fall;Other (comment)  Precaution Comments R LE wound vac  Restrictions  Weight Bearing Restrictions Yes  RLE Weight Bearing NWB  Pain Assessment  Pain Assessment No/denies pain  Cognition  Arousal/Alertness Awake/alert  Behavior During Therapy WFL for tasks assessed/performed;Restless  Overall Cognitive Status No family/caregiver present to determine baseline cognitive functioning  Area of Impairment Attention;Following commands;Safety/judgement;Awareness;Problem solving  Current Attention Level Sustained  Following Commands Follows one step commands with increased time  Safety/Judgement Decreased awareness of safety  Awareness Emergent  Problem Solving Slow processing;Requires verbal cues  General Comments easily distracted and does benefit from cues for problem solving and staying on task. appears somewhat confused.  Upper Extremity Assessment  Upper Extremity Assessment Overall WFL for tasks assessed  Lower Extremity  Assessment  Lower Extremity Assessment Defer to PT evaluation  Vision- Assessment  Vision Assessment? No apparent visual deficits  ADL  Overall ADL's  Needs assistance/impaired  General ADL Comments Focus on transfer back to bed per nursing request for assistance.  Bed Mobility  Overal bed mobility Needs Assistance  Bed Mobility Sit to Supine  Sit to supine Supervision  General bed mobility comments for safety and attention to wound vac line  Transfers  Overall transfer level Needs assistance  Transfers Bed to chair/wheelchair/BSC   Lateral/Scoot Transfers Min guard  General transfer comment Positioned pt in appropriate spot for scoot transfer back to bed towards R side. min guard though pt w/ some difficulty w/ last scoot to get fully back into bed  Balance  Overall balance assessment Needs assistance  Sitting-balance support No upper extremity supported;Feet supported  Sitting balance-Leahy Scale Good  General Comments  General comments (skin integrity, edema, etc.) Wife present during session. Pt/wife requested to leave prosthetic LE on (reports it helps him remember day and night as anesthesia makes him confused). Wife also inquiring about protein shakes to promote healing/nutrition as well as resources for hired help at DC  OT - End of Session  Equipment Utilized During [employer] walker (2 wheels);Gait belt  Activity Tolerance Patient tolerated treatment well  Patient left with call bell/phone within reach;with chair alarm set;in bed;with family/visitor present  Nurse Communication Mobility status;Other (comment) (request for protein shake)  OT Assessment/Plan  OT Plan Discharge plan remains appropriate  OT Visit Diagnosis Unsteadiness on feet (R26.81);Other abnormalities of gait and mobility (R26.89)  OT Frequency (ACUTE ONLY) Min 2X/week  Recommendations for Other Services Rehab consult  Follow Up Recommendations Acute inpatient rehab (3hours/day)  Assistance  recommended at discharge Frequent or constant Supervision/Assistance  Patient can return home with the following A lot of help  with walking and/or transfers;A lot of help with bathing/dressing/bathroom  OT Equipment None recommended by OT  AM-PAC OT "6 Clicks" Daily Activity Outcome Measure (Version 2)  Help from another person eating meals? 3  Help from another person taking care of personal grooming? 3  Help from another person toileting, which includes using toliet, bedpan, or urinal? 2  Help from another person bathing (including washing, rinsing, drying)? 2  Help from another person to put on and taking off regular upper body clothing? 3  Help from another person to put on and taking off regular lower body clothing? 2  6 Click Score 15  Progressive Mobility  What is the highest level of mobility based on the progressive mobility assessment? Level 3 (Stands with assist) - Balance while standing  and cannot march in place  Acute Rehab OT Goals  Patient Stated Goal go to rehab, find some extra hired assist at home  OT Goal Formulation With patient  Time For Goal Achievement 08/11/22  Potential to Achieve Goals Good  OT Time Calculation  OT Start Time (ACUTE ONLY) 1320  OT Stop Time (ACUTE ONLY) 1337  OT Time Calculation (min) 17 min  OT General Charges  $OT Visit 1 Visit  OT Treatments  $Therapeutic Activity 8-22 mins

## 2022-07-29 ENCOUNTER — Other Ambulatory Visit: Payer: Self-pay

## 2022-07-29 ENCOUNTER — Encounter (HOSPITAL_COMMUNITY): Payer: Self-pay | Admitting: Orthopedic Surgery

## 2022-07-29 ENCOUNTER — Inpatient Hospital Stay (HOSPITAL_COMMUNITY)
Admission: RE | Admit: 2022-07-29 | Discharge: 2022-08-11 | DRG: 559 | Disposition: A | Discharge status: Home Health | Payer: Medicare Other | Source: Intra-hospital | Attending: Physical Medicine and Rehabilitation | Admitting: Physical Medicine and Rehabilitation

## 2022-07-29 DIAGNOSIS — Z6841 Body Mass Index (BMI) 40.0 and over, adult: Secondary | ICD-10-CM

## 2022-07-29 DIAGNOSIS — E871 Hypo-osmolality and hyponatremia: Secondary | ICD-10-CM | POA: Diagnosis present

## 2022-07-29 DIAGNOSIS — Z79890 Hormone replacement therapy (postmenopausal): Secondary | ICD-10-CM | POA: Diagnosis not present

## 2022-07-29 DIAGNOSIS — K219 Gastro-esophageal reflux disease without esophagitis: Secondary | ICD-10-CM | POA: Diagnosis present

## 2022-07-29 DIAGNOSIS — D649 Anemia, unspecified: Secondary | ICD-10-CM | POA: Diagnosis present

## 2022-07-29 DIAGNOSIS — N2581 Secondary hyperparathyroidism of renal origin: Secondary | ICD-10-CM | POA: Diagnosis present

## 2022-07-29 DIAGNOSIS — E1122 Type 2 diabetes mellitus with diabetic chronic kidney disease: Secondary | ICD-10-CM | POA: Diagnosis present

## 2022-07-29 DIAGNOSIS — S88112A Complete traumatic amputation at level between knee and ankle, left lower leg, initial encounter: Secondary | ICD-10-CM

## 2022-07-29 DIAGNOSIS — Z89512 Acquired absence of left leg below knee: Secondary | ICD-10-CM

## 2022-07-29 DIAGNOSIS — Z833 Family history of diabetes mellitus: Secondary | ICD-10-CM

## 2022-07-29 DIAGNOSIS — Z79899 Other long term (current) drug therapy: Secondary | ICD-10-CM

## 2022-07-29 DIAGNOSIS — L03115 Cellulitis of right lower limb: Secondary | ICD-10-CM | POA: Diagnosis present

## 2022-07-29 DIAGNOSIS — Z87891 Personal history of nicotine dependence: Secondary | ICD-10-CM

## 2022-07-29 DIAGNOSIS — I2510 Atherosclerotic heart disease of native coronary artery without angina pectoris: Secondary | ICD-10-CM | POA: Diagnosis present

## 2022-07-29 DIAGNOSIS — E039 Hypothyroidism, unspecified: Secondary | ICD-10-CM | POA: Diagnosis present

## 2022-07-29 DIAGNOSIS — Z89511 Acquired absence of right leg below knee: Secondary | ICD-10-CM

## 2022-07-29 DIAGNOSIS — E875 Hyperkalemia: Secondary | ICD-10-CM | POA: Diagnosis not present

## 2022-07-29 DIAGNOSIS — N186 End stage renal disease: Secondary | ICD-10-CM | POA: Diagnosis present

## 2022-07-29 DIAGNOSIS — Z8546 Personal history of malignant neoplasm of prostate: Secondary | ICD-10-CM | POA: Diagnosis not present

## 2022-07-29 DIAGNOSIS — E1141 Type 2 diabetes mellitus with diabetic mononeuropathy: Secondary | ICD-10-CM | POA: Diagnosis present

## 2022-07-29 DIAGNOSIS — G546 Phantom limb syndrome with pain: Secondary | ICD-10-CM | POA: Diagnosis present

## 2022-07-29 DIAGNOSIS — Z8249 Family history of ischemic heart disease and other diseases of the circulatory system: Secondary | ICD-10-CM

## 2022-07-29 DIAGNOSIS — L309 Dermatitis, unspecified: Secondary | ICD-10-CM | POA: Diagnosis present

## 2022-07-29 DIAGNOSIS — I120 Hypertensive chronic kidney disease with stage 5 chronic kidney disease or end stage renal disease: Secondary | ICD-10-CM | POA: Diagnosis present

## 2022-07-29 DIAGNOSIS — M109 Gout, unspecified: Secondary | ICD-10-CM | POA: Diagnosis present

## 2022-07-29 DIAGNOSIS — J9600 Acute respiratory failure, unspecified whether with hypoxia or hypercapnia: Secondary | ICD-10-CM | POA: Diagnosis not present

## 2022-07-29 DIAGNOSIS — E7800 Pure hypercholesterolemia, unspecified: Secondary | ICD-10-CM | POA: Diagnosis present

## 2022-07-29 DIAGNOSIS — G4700 Insomnia, unspecified: Secondary | ICD-10-CM | POA: Diagnosis present

## 2022-07-29 DIAGNOSIS — Z992 Dependence on renal dialysis: Secondary | ICD-10-CM

## 2022-07-29 DIAGNOSIS — I10 Essential (primary) hypertension: Secondary | ICD-10-CM

## 2022-07-29 DIAGNOSIS — Z841 Family history of disorders of kidney and ureter: Secondary | ICD-10-CM | POA: Diagnosis not present

## 2022-07-29 DIAGNOSIS — Z4781 Encounter for orthopedic aftercare following surgical amputation: Secondary | ICD-10-CM | POA: Diagnosis present

## 2022-07-29 DIAGNOSIS — Z951 Presence of aortocoronary bypass graft: Secondary | ICD-10-CM

## 2022-07-29 DIAGNOSIS — M79604 Pain in right leg: Secondary | ICD-10-CM | POA: Diagnosis not present

## 2022-07-29 DIAGNOSIS — Z9079 Acquired absence of other genital organ(s): Secondary | ICD-10-CM

## 2022-07-29 DIAGNOSIS — E1151 Type 2 diabetes mellitus with diabetic peripheral angiopathy without gangrene: Secondary | ICD-10-CM | POA: Diagnosis present

## 2022-07-29 DIAGNOSIS — L089 Local infection of the skin and subcutaneous tissue, unspecified: Secondary | ICD-10-CM | POA: Diagnosis not present

## 2022-07-29 DIAGNOSIS — Z800 Family history of malignant neoplasm of digestive organs: Secondary | ICD-10-CM

## 2022-07-29 DIAGNOSIS — Z7985 Long-term (current) use of injectable non-insulin antidiabetic drugs: Secondary | ICD-10-CM

## 2022-07-29 DIAGNOSIS — R5383 Other fatigue: Secondary | ICD-10-CM | POA: Diagnosis present

## 2022-07-29 LAB — CBC
HCT: 31.4 % — ABNORMAL LOW (ref 39.0–52.0)
Hemoglobin: 9.7 g/dL — ABNORMAL LOW (ref 13.0–17.0)
MCH: 28.8 pg (ref 26.0–34.0)
MCHC: 30.9 g/dL (ref 30.0–36.0)
MCV: 93.2 fL (ref 80.0–100.0)
Platelets: 171 10*3/uL (ref 150–400)
RBC: 3.37 MIL/uL — ABNORMAL LOW (ref 4.22–5.81)
RDW: 16.1 % — ABNORMAL HIGH (ref 11.5–15.5)
WBC: 8.5 10*3/uL (ref 4.0–10.5)
nRBC: 0 % (ref 0.0–0.2)

## 2022-07-29 LAB — GLUCOSE, CAPILLARY
Glucose-Capillary: 113 mg/dL — ABNORMAL HIGH (ref 70–99)
Glucose-Capillary: 155 mg/dL — ABNORMAL HIGH (ref 70–99)
Glucose-Capillary: 157 mg/dL — ABNORMAL HIGH (ref 70–99)
Glucose-Capillary: 114 mg/dL — ABNORMAL HIGH (ref 70–99)

## 2022-07-29 LAB — CREATININE, SERUM
Creatinine, Ser: 8.42 mg/dL — ABNORMAL HIGH (ref 0.61–1.24)
GFR, Estimated: 6 mL/min — ABNORMAL LOW (ref >60)

## 2022-07-29 MED ORDER — DARBEPOETIN ALFA 100 MCG/0.5ML IJ SOSY
100.0000 ug | PREFILLED_SYRINGE | INTRAMUSCULAR | Status: DC
  Filled 2022-07-29: qty 0.5

## 2022-07-29 MED ORDER — HEPARIN SODIUM (PORCINE) 5000 UNIT/ML IJ SOLN
5000.0000 [IU] | Freq: Three times a day (TID) | INTRAMUSCULAR | Status: DC
  Administered 2022-07-29 – 2022-07-31 (×4): 5000 [IU] via SUBCUTANEOUS
  Filled 2022-07-29 (×5): qty 1

## 2022-07-29 MED ORDER — INSULIN ASPART 100 UNIT/ML IJ SOLN
0.0000 [IU] | Freq: Three times a day (TID) | INTRAMUSCULAR | Status: DC
  Administered 2022-07-30 (×2): 1 [IU] via SUBCUTANEOUS

## 2022-07-29 MED ORDER — TRAZODONE HCL 50 MG PO TABS
50.0000 mg | ORAL_TABLET | Freq: Every evening | ORAL | Status: DC | PRN
  Administered 2022-07-30: 50 mg via ORAL
  Filled 2022-07-29 (×3): qty 1

## 2022-07-29 MED ORDER — ASCORBIC ACID 1000 MG PO TABS: 1000.0000 mg | ORAL_TABLET | Freq: Every day | ORAL | Status: DC

## 2022-07-29 MED ORDER — CARVEDILOL 12.5 MG PO TABS
12.5000 mg | ORAL_TABLET | Freq: Two times a day (BID) | ORAL | Status: DC
  Administered 2022-07-29 (×2): 12.5 mg via ORAL
  Filled 2022-07-29 (×2): qty 1

## 2022-07-29 MED ORDER — PANTOPRAZOLE SODIUM 40 MG PO TBEC
40.0000 mg | DELAYED_RELEASE_TABLET | Freq: Every day | ORAL | Status: DC
  Administered 2022-07-29 – 2022-08-10 (×13): 40 mg via ORAL
  Filled 2022-07-29 (×14): qty 1

## 2022-07-29 MED ORDER — LEVOTHYROXINE SODIUM 112 MCG PO TABS
112.0000 ug | ORAL_TABLET | Freq: Every day | ORAL | Status: DC
  Administered 2022-07-30 – 2022-08-11 (×12): 112 ug via ORAL
  Filled 2022-07-29 (×13): qty 1

## 2022-07-29 MED ORDER — ZINC SULFATE 220 (50 ZN) MG PO CAPS: 220.0000 mg | ORAL_CAPSULE | Freq: Every day | ORAL | Status: DC

## 2022-07-29 MED ORDER — VITAMIN C 500 MG PO TABS
1000.0000 mg | ORAL_TABLET | Freq: Every day | ORAL | Status: DC
  Administered 2022-07-30 – 2022-07-31 (×2): 1000 mg via ORAL
  Filled 2022-07-29 (×2): qty 2

## 2022-07-29 MED ORDER — ROSUVASTATIN CALCIUM 5 MG PO TABS
10.0000 mg | ORAL_TABLET | Freq: Every day | ORAL | Status: DC
  Administered 2022-07-30 – 2022-08-11 (×13): 10 mg via ORAL
  Filled 2022-07-29 (×13): qty 2

## 2022-07-29 MED ORDER — JUVEN PO PACK
1.0000 | PACK | Freq: Two times a day (BID) | ORAL | Status: DC
  Filled 2022-07-29: qty 1

## 2022-07-29 MED ORDER — OXYCODONE HCL 5 MG PO TABS
5.0000 mg | ORAL_TABLET | ORAL | Status: DC | PRN
  Administered 2022-07-30: 5 mg via ORAL
  Administered 2022-07-30 (×2): 10 mg via ORAL
  Filled 2022-07-29 (×2): qty 2
  Filled 2022-07-29: qty 1
  Filled 2022-07-29: qty 2

## 2022-07-29 MED ORDER — OXYCODONE HCL 5 MG PO TABS: 5.0000 mg | ORAL_TABLET | ORAL | Status: DC | PRN

## 2022-07-29 MED ORDER — GUAIFENESIN-DM 100-10 MG/5ML PO SYRP: 15.0000 mL | ORAL_SOLUTION | ORAL | Status: DC | PRN

## 2022-07-29 MED ORDER — NEPRO/CARBSTEADY PO LIQD
237.0000 mL | Freq: Three times a day (TID) | ORAL | Status: DC
  Administered 2022-07-29 – 2022-07-31 (×5): 237 mL via ORAL

## 2022-07-29 MED ORDER — POLYETHYLENE GLYCOL 3350 17 G PO PACK: 17.0000 g | PACK | Freq: Every day | ORAL | Status: DC

## 2022-07-29 MED ORDER — ACETAMINOPHEN 325 MG PO TABS
650.0000 mg | ORAL_TABLET | ORAL | Status: DC | PRN
  Administered 2022-07-31 – 2022-08-06 (×11): 650 mg via ORAL
  Filled 2022-07-29 (×11): qty 2

## 2022-07-29 MED ORDER — HEPARIN SODIUM (PORCINE) 5000 UNIT/ML IJ SOLN: 5000.0000 [IU] | Freq: Three times a day (TID) | INTRAMUSCULAR | Status: DC

## 2022-07-29 MED ORDER — ZINC SULFATE 220 (50 ZN) MG PO CAPS
220.0000 mg | ORAL_CAPSULE | Freq: Every day | ORAL | Status: AC
  Administered 2022-07-30 – 2022-08-02 (×3): 220 mg via ORAL
  Filled 2022-07-29 (×9): qty 1

## 2022-07-29 MED ORDER — DOCUSATE SODIUM 100 MG PO CAPS: 100.0000 mg | ORAL_CAPSULE | Freq: Two times a day (BID) | ORAL | Status: DC

## 2022-07-29 MED ORDER — DOCUSATE SODIUM 100 MG PO CAPS
100.0000 mg | ORAL_CAPSULE | Freq: Two times a day (BID) | ORAL | Status: DC
  Filled 2022-07-29: qty 1

## 2022-07-29 MED ORDER — CARVEDILOL 12.5 MG PO TABS
12.5000 mg | ORAL_TABLET | Freq: Two times a day (BID) | ORAL | Status: DC
  Administered 2022-07-30 – 2022-08-11 (×24): 12.5 mg via ORAL
  Filled 2022-07-29 (×23): qty 1

## 2022-07-29 MED ORDER — BISACODYL 5 MG PO TBEC: 5.0000 mg | DELAYED_RELEASE_TABLET | Freq: Every day | ORAL | Status: DC | PRN

## 2022-07-29 MED ORDER — POLYETHYLENE GLYCOL 3350 17 G PO PACK
17.0000 g | PACK | Freq: Every day | ORAL | Status: DC
  Filled 2022-07-29: qty 1

## 2022-07-29 MED ORDER — PANTOPRAZOLE SODIUM 40 MG PO TBEC: 40.0000 mg | DELAYED_RELEASE_TABLET | Freq: Every day | ORAL | Status: DC

## 2022-07-29 NOTE — Progress Notes (Signed)
Patient ID: [name], male   DOB: 18-Mar-1951, 72 y.o.   MRN: [id number] Patient is status post right below-knee amputation there is no drainage in the wound VAC canister.  Patient denies any pain.  Patient is trying to pursue inpatient rehab.  Would transition to the Praveena plus VAC at time of discharge.

## 2022-07-29 NOTE — Progress Notes (Addendum)
Subjective: Up in wheelchair, states "going to have rehab admit."  No complaints next dialysis tomorrow Tuesday on schedule  Objective Vital signs in last 24 hours: Vitals:   07/28/22 1640 07/28/22 1943 07/29/22 0522 07/29/22 0908  BP: (!) 166/71 (!) 154/85 (!) 162/71 (!) 167/75  Pulse: 98 87 75 72  Resp: (!) Temp: 98.3 F (36.8 C) 98 F (36.7 C) 98.3 F (36.8 C) 98.6 F (37 C)  TempSrc:  Oral Oral   SpO2: 98% 95% 95%   Weight:      Height:       Weight change:   Physical Exam: General: Alert chronically ill-appearing male NAD Heart: RRR no MRG Lungs: CTA bilaterally nonlabored breathing Abdomen: NABS soft NTND Extremities: Right BKA dressing dry clean /left BKA, with prosthesis on Dialysis Access: LUA AV fistula+  bruit  Dialysis Orders: [facility] (Dr. Signe Colt follows)- Sun - Wed - Fri 3:48hr, BFR 425, EDW 88kg, L AVF, no heparin - Venofer  IV weekly.    Problem/Plan:  1. RLE cellulitis: Status post right BKA 4/19 Dr. Lajoyce Corners, had wound VAC, antibiotics complete/CIR admit  2. Acute Hypoxic Resp Failure: Admit intubated / extubated 4/16 /stable now UF as tolerated 3. ESRD: [facility] / in hosp TTS schedule while here - Next HD 4/23. 4. HTN/volume: BP stable on HD, variable bed weights - 1L UFG. 5. Anemia: Hgb now 9.8 - s/p 2 units PRBCs 4/18. Aranesp given 4/18.. 6. Secondary hyperparathyroidism:  CorrCa/Phos 4.2 -on Renvela as outpatient no current binder /no vitamin D 7. Nutrition: ALB 2.9 /on protein supp  8. T2DM 9. CAD   Lenny Pastel, PA-C [facility] [phone] 07/29/2022,11:10 AM  LOS: 6 days   Patient seen and examined, agree with above note with above modifications. No new issue-  unable to go to CIR today but they are hoping for tomorrow-  routine HD tomorrow and appropriate titration of HD related meds  Annie Sable, MD 07/29/2022    Labs: Basic Metabolic Panel: Recent Labs  Lab 07/23/22 0337 07/25/22 0218  07/26/22 1018 07/27/22 0801  NA 130* 135 135 132*  K 3.8 3.5 3.7 3.8  CL 91* 96* 95* 94*  CO2 26 27  --  24  GLUCOSE 130* 182* 105* 160*  BUN 49* 50* 32* 49*  CREATININE 8.12* 7.95* 6.50* 7.54*  CALCIUM 7.8* 7.9*  --  7.9*  PHOS 5.0*  --   --  4.2   Liver Function Tests: Recent Labs  Lab 07/22/22 1832 07/27/22 0801  AST 27  --   ALT 16  --   ALKPHOS 169*  --   BILITOT 1.4*  --   PROT 6.3*  --   ALBUMIN 3.9 2.9*   Recent Labs  Lab 07/22/22 1832  LIPASE 82*   No results for input(s): "AMMONIA" in the last 168 hours. CBC: Recent Labs  Lab 07/22/22 1832 07/22/22 1903 07/23/22 0337 07/25/22 0218 07/25/22 1359 07/26/22 1018 07/27/22 0801  WBC 7.1  --  8.4 5.5  --   --  7.9  NEUTROABS 6.0  --   --  4.5  --   --   --   HGB 7.9*   < > 7.8* 7.8* 9.7* 10.9* 9.8*  HCT 23.6*   < > 23.6* 23.5* 28.4* 32.0* 29.3*  MCV 88.1  --  90.4 90.4  --   --  89.1  PLT 88*  --  76* 91*  --   --  126*   < > =  values in this interval not displayed.   Cardiac Enzymes: No results for input(s): "CKTOTAL", "CKMB", "CKMBINDEX", "TROPONINI" in the last 168 hours. CBG: Recent Labs  Lab 07/28/22 0722 07/28/22 1106 07/28/22 1640 07/28/22 2034 07/29/22 0719  GLUCAP 129* 164* 158* 125* 113*    Studies/Results: No results found. Medications:  magnesium sulfate bolus IVPB      vitamin C  1,000 mg Oral Daily   carvedilol  12.5 mg Oral BID WC   darbepoetin (ARANESP) injection - DIALYSIS  100 mcg Subcutaneous Q Thu-1800   docusate sodium  100 mg Oral BID   feeding supplement (NEPRO CARB STEADY)  237 mL Oral TID BM   heparin  5,000 Units Subcutaneous Q8H   insulin aspart  0-5 Units Subcutaneous QHS   insulin aspart  0-9 Units Subcutaneous TID WC   levothyroxine  112 mcg Oral Q0600   nutrition supplement (JUVEN)  1 packet Oral BID BM   pantoprazole  40 mg Oral QHS   polyethylene glycol  17 g Oral Daily   rosuvastatin  10 mg Oral Daily   zinc sulfate  220 mg Oral Daily

## 2022-07-29 NOTE — Discharge Summary (Signed)
Physician Discharge Summary  LEOVANNI BJORKMAN ZOX:[id number] DOB: May 31, 1950 DOA: 07/22/2022  PCP: Adolph Pollack, FNP  Admit date: 07/22/2022 Discharge date: 07/29/2022  Admitted From: Home Disposition: CIR  Recommendations for Outpatient Follow-up:  Follow up with CIR provider at earliest convenience Outpatient follow-up with orthopedics/Dr. Lajoyce Corners.  Wound care as per Dr. Audrie Lia recommendations Dialysis as per nephrology schedule Follow up in ED if symptoms worsen or new appear   Home Health: No Equipment/Devices: None  Discharge Condition: Stable CODE STATUS: Full Diet recommendation: Heart healthy/carb modified/renal hemodialysis diet  Brief/Interim Summary: 72 year old male with history of hypertension, hyperlipidemia, CAD, diabetes mellitus type 2, peripheral vascular disease, diabetic foot wounds requiring amputations, end-stage renal disease on home hemodialysis, hypothyroidism, prostate cancer presented with altered mental status/weakness and hypoxia.  He required BiPAP on presentation with initial improvement but later his mental status deteriorated and patient subsequently got intubated.  COVID-19/influenza/RSV PCR were negative.  Chest x-ray was unremarkable.  Right foot x-ray showed subcutaneous gas within the soft tissues lateral and plantar to the fifth tarsometatarsal joint, reflective of local trauma and/or infection.  CT head showed no acute intracranial abnormality.  CT chest/abdomen/pelvis showed no acute abnormality except for mildly nodular hepatic contour (?  Cirrhosis), small volume ascites.  Nephrology and orthopedics were consulted.  He was extubated on 07/23/2022.  He was transferred to [facility] service from 07/24/2022 onwards.  He underwent right below-knee amputation on 07/26/2022 by Dr. Lajoyce Corners.  Subsequently, PT recommended CIR placement.  Currently medically stable for discharge to CIR. he will be discharged to CIR once bed is available.  Discharge Diagnoses:   Acute  respiratory failure with hypoxia -Required intubation on presentation.  Extubated on 07/23/2022. - transferred to [facility] service from 07/24/2022 onwards. -Currently on room air.   Sepsis: Present on admission Right lower extremity cellulitis -Right foot x-ray showed subcutaneous gas within the soft tissues lateral and plantar to the fifth tarsometatarsal joint, reflective of local trauma and/or infection. --Orthopedics/Dr. Lajoyce Corners following: underwent right below-knee amputation on 07/26/2022.  Wound care as per orthopedics recommendations.  Cultures negative so far.  Antibiotics have already been discontinued postoperatively as per orthopedics recommendations. -PT recommended CIR placement.  -Currently medically stable for discharge to CIR. he will be discharged to CIR once bed is available.   Acute metabolic encephalopathy -Secondary to above.  Mental status much improved.  Monitor mental status.  CT head unremarkable.  Lyrica remains on hold.   End-stage renal disease on home hemodialysis -Nephrology following.  Dialysis as per nephrology schedule.   Hyponatremia -Managed by dialysis by nephrology.  Improved.  Monitor intermittently.   Anemia of chronic disease -From renal failure.  Hemoglobin currently stable.  Monitor intermittently.   Hypomagnesemia -Improved.  No labs today   Thrombocytopenia -Questionable cause.  Improved.  Monitor intermittently.   Diabetic peripheral neuropathy Peripheral vascular disease Status post amputations (left BKA, right toe/ray amputation) -Lyrica on hold for now.  Continue supportive care.  Pain management.   Hypothyroidism -Continue levothyroxine   Hyperlipidemia -Continue statin   Diabetes mellitus type 2 -Carb modified diet.  Blood sugars currently stable.   Hypertension CAD -Blood pressure currently stable.  Home antihypertensives on hold for now.  Will resume Coreg today.  Losartan can also be resumed later if blood pressure remains  elevated.  Outpatient follow-up with cardiology.   GERD -Continue PPI   Physical deconditioning -PT recommends inpatient rehab.  Discharge Instructions  Discharge Instructions     Diet - low sodium heart healthy  Complete by: As directed    Renal hemodialysis diet   Discharge wound care:   Complete by: As directed    As per Dr. Audrie Lia recommendations   Increase activity slowly   Complete by: As directed       Allergies as of 07/29/2022       Reactions   Morphine    Other reaction(s): Delusions (intolerance)   Mushroom Extract Complex Nausea Only        Medication List     STOP taking these medications    losartan 50 MG tablet Commonly known as: COZAAR   MIRCERA IJ   pregabalin 150 MG capsule Commonly known as: LYRICA   prochlorperazine 5 MG tablet Commonly known as: COMPAZINE   sevelamer carbonate 800 MG tablet Commonly known as: RENVELA       TAKE these medications    acetaminophen 500 MG tablet Commonly known as: TYLENOL Take 1,000 mg by mouth every 8 (eight) hours as needed for mild pain or headache.   allopurinol 100 MG tablet Commonly known as: ZYLOPRIM Take 2 tablets (200 mg total) by mouth daily.   ascorbic acid 1000 MG tablet Commonly known as: VITAMIN C Take 1 tablet (1,000 mg total) by mouth daily. Start taking on: July 30, 2022   carvedilol 12.5 MG tablet Commonly known as: COREG TAKE 1 TABLET BY MOUTH IN THE MORNING AND AT BEDTIME. What changed: See the new instructions.   Darbepoetin Alfa 100 MCG/0.5ML Sosy injection Commonly known as: ARANESP Inject 0.5 mLs (100 mcg total) into the vein every Thursday with hemodialysis.   docusate sodium 100 MG capsule Commonly known as: COLACE Take 1 capsule (100 mg total) by mouth 2 (two) times daily.   levothyroxine 112 MCG tablet Commonly known as: SYNTHROID Take 1 tablet (112 mcg total) by mouth daily before breakfast.   modafinil 200 MG tablet Commonly known as:  PROVIGIL Take 1 tablet (200 mg total) by mouth daily.   oxyCODONE 5 MG immediate release tablet Commonly known as: Oxy IR/ROXICODONE Take 1-2 tablets (5-10 mg total) by mouth every 4 (four) hours as needed for moderate pain (pain score 4-6).   Ozempic (0.25 or 0.5 MG/DOSE) 2 MG/3ML Sopn Generic drug: Semaglutide(0.25 or 0.5MG /DOS) Inject 0.5 mg into the skin once a week.   pantoprazole 40 MG tablet Commonly known as: PROTONIX Take 1 tablet (40 mg total) by mouth daily. What changed: when to take this   Pentips 32G X 4 MM Misc Generic drug: Insulin Pen Needle Use to inject insulin up to 4 times daily as needed.   polyethylene glycol 17 g packet Commonly known as: MIRALAX / GLYCOLAX Take 17 g by mouth daily. Start taking on: July 30, 2022   rosuvastatin 10 MG tablet Commonly known as: CRESTOR Take 1 tablet (10 mg total) by mouth daily.   Vitamin D (Ergocalciferol) 1.25 MG (50000 UNIT) Caps capsule Commonly known as: DRISDOL Take 1 capsule (50,000 Units total) by mouth every 7 (seven) days.   zinc sulfate 220 (50 Zn) MG capsule Take 1 capsule (220 mg total) by mouth daily. Start taking on: July 30, 2022               Discharge Care Instructions  (From admission, onward)           Start     Ordered   07/29/22 0000  Discharge wound care:       Comments: As per Dr. Audrie Lia recommendations   07/29/22 1018  Follow-up Information     Nadara Mustard, MD Follow up in 1 week(s).   Specialty: Orthopedic Surgery Contact information: [address] [place] [address] [phone]                Allergies  Allergen Reactions   Morphine     Other reaction(s): Delusions (intolerance)   Mushroom Extract Complex Nausea Only    Consultations: Orthopedics   Procedures/Studies: DG Chest Port 1 View  Result Date: 07/24/2022 CLINICAL DATA:  Follow-up lung status post extubation. EXAM: PORTABLE CHEST 1 VIEW COMPARISON:  Portable chest  April 15 at 11:51 p.m. FINDINGS: 4:45 a.m. interval extubation and NGT removal. Lungs are clear. No pleural effusion is seen. Stable cardiomegaly and CABG change. Central vascular fullness continues to be seen without overt edema. The mediastinum is stable with aortic atherosclerosis. There is partially visible fusion plating in the lower cervical region. IMPRESSION: 1. Interval extubation and NGT removal. 2. Stable cardiomegaly and CABG change.  Clear lungs. 3. Central vascular fullness without overt edema. Electronically Signed   By: Almira Bar M.D.   On: 07/24/2022 07:11   DG Abdomen 1 View  Result Date: 07/23/2022 CLINICAL DATA:  NG tube placement EXAM: ABDOMEN - 1 VIEW COMPARISON:  None Available. FINDINGS: The bowel gas pattern is normal. No radio-opaque calculi or other significant radiographic abnormality are seen. Enteric tube tip projects over the left upper quadrant. The enteric tube is incompletely visualized on this image. On chest radiograph performed immediately after this abdominal radiograph at 11:44 p.m. the enteric tube tip is in the stomach with side port likely near the GE junction. IMPRESSION: Enteric tube tip projects over the left upper quadrant. The enteric tube is incompletely visualized on this image. On chest radiograph performed immediately after this abdominal radiograph at 11:44 p.m. the enteric tube tip is in the stomach with side port likely near the GE junction. Electronically Signed   By: Minerva Fester M.D.   On: 07/23/2022 00:13   DG Chest Portable 1 View  Result Date: 07/23/2022 CLINICAL DATA:  Post intubation EXAM: PORTABLE CHEST 1 VIEW COMPARISON:  Chest radiographs and CT 07/22/2022. FINDINGS: Endotracheal tube in the intrathoracic trachea approximately 6.9 cm from the carina. Subdiaphragmatic enteric tube. Sternotomy. Cervical spine fusion hardware. Low lung volumes accentuate pulmonary vascularity and cardiomediastinal silhouette. No focal consolidation,  pleural effusion, or pneumothorax. No displaced rib fractures. IMPRESSION: Endotracheal tube in the intrathoracic trachea approximately 6.9 cm from the carina. Electronically Signed   By: Minerva Fester M.D.   On: 07/23/2022 00:10   CT CHEST ABDOMEN PELVIS W CONTRAST  Result Date: 07/22/2022 CLINICAL DATA:  Altered mental status, weakness, sepsis EXAM: CT CHEST, ABDOMEN, AND PELVIS WITH CONTRAST TECHNIQUE: Multidetector CT imaging of the chest, abdomen and pelvis was performed following the standard protocol during bolus administration of intravenous contrast. RADIATION DOSE REDUCTION: This exam was performed according to the departmental dose-optimization program which includes automated exposure control, adjustment of the mA and/or kV according to patient size and/or use of iterative reconstruction technique. CONTRAST:  80mL OMNIPAQUE IOHEXOL 300 MG/ML  SOLN COMPARISON:  CT abdomen/pelvis dated 06/20/2022 FINDINGS: CT CHEST FINDINGS Cardiovascular: The heart is normal in size. No pericardial effusion. No evidence of thoracic aortic aneurysm. Atherosclerotic calcifications of the aortic arch. Severe three-vessel coronary atherosclerosis. Postsurgical changes related to prior CABG. Mediastinum/Nodes: No suspicious mediastinal lymphadenopathy. Visualized thyroid is unremarkable. Lungs/Pleura: Evaluation of the lung parenchyma is constrained by respiratory motion. Within that constraint, there are no suspicious  pulmonary nodules. Very mild dependent bilateral lower lobe atelectasis. No focal consolidation. No pleural effusion or pneumothorax. Musculoskeletal: Degenerative changes of the mid/lower thoracic spine. Cervical spine fixation hardware, incompletely visualized. Median sternotomy. Stable sclerotic lesion along the left T10 vertebral body (sagittal image 87), unchanged since 2022. While indeterminate in this clinical setting, stability favors a benign bone island. CT ABDOMEN PELVIS FINDINGS Hepatobiliary:  Mildly nodular hepatic contour. Status post cholecystectomy. No intrahepatic or extrahepatic duct dilatation. Pancreas: Within normal limits. Spleen: Within normal limits. Adrenals/Urinary Tract: Stable 3.9 cm right adrenal mass with macroscopic fat, compatible with a benign myelolipoma. Nodular thickening of the left adrenal gland without discrete mass. Bilateral renal cysts, including 3.7 cm posterior right upper pole renal cyst (series 3/image 92). No hydronephrosis. Bladder is thick-walled although underdistended. Stomach/Bowel: Stomach is within normal limits. No evidence of bowel obstruction. Normal appendix (series 3/image 100). Sigmoid diverticulosis, without evidence of diverticulitis. Vascular/Lymphatic: No evidence of abdominal aortic aneurysm. Atherosclerotic calcifications of the abdominal aorta and branch vessels. No suspicious abdominopelvic lymphadenopathy. Reproductive: Prostatomegaly, with enlargement of the central gland indenting the base of the bladder, suggesting BPH. Additional fiducial markers. Other: Trace perihepatic and perisplenic ascites. Small volume pelvic ascites. This appearance is similar to the prior. Musculoskeletal: Degenerative changes of the lumbar spine. IMPRESSION: No acute cardiopulmonary abnormality. Mildly nodular hepatic contour, correlate for cirrhosis. Small volume abdominopelvic ascites, chronic. Fiducial markers in the prostate. No findings specific for metastatic disease. Additional ancillary findings as above. Electronically Signed   By: Charline Bills M.D.   On: 07/22/2022 20:53   CT Head Wo Contrast  Result Date: 07/22/2022 CLINICAL DATA:  Altered mental status, delirium EXAM: CT HEAD WITHOUT CONTRAST TECHNIQUE: Contiguous axial images were obtained from the base of the skull through the vertex without intravenous contrast. RADIATION DOSE REDUCTION: This exam was performed according to the departmental dose-optimization program which includes automated  exposure control, adjustment of the mA and/or kV according to patient size and/or use of iterative reconstruction technique. COMPARISON:  MRI brain dated 10/16/2021 FINDINGS: Brain: No evidence of acute infarction, hemorrhage, hydrocephalus, extra-axial collection or mass lesion/mass effect. Mild subcortical white matter and periventricular small vessel ischemic changes. Vascular: Intracranial atherosclerosis. Skull: Normal. Negative for fracture or focal lesion. Sinuses/Orbits: The visualized paranasal sinuses are essentially clear. The mastoid air cells are unopacified. Other: None. IMPRESSION: No acute intracranial abnormality. Mild small vessel ischemic changes. Electronically Signed   By: Charline Bills M.D.   On: 07/22/2022 20:46   DG Foot 2 Views Right  Result Date: 07/22/2022 CLINICAL DATA:  Osteomyelitis EXAM: RIGHT FOOT - 2 VIEW COMPARISON:  01/03/2020 FINDINGS: Two view radiograph of the right foot is limited by obliquity on frontal radiograph. Transmetatarsal amputation of the fourth and fifth digits is again noted. No acute fracture or dislocation. Large superior calcaneal spur. Vascular calcifications are noted. There is subcutaneous gas noted within the soft tissues lateral and plantar to the fifth tarsometatarsal joint, however, no superimposed osseous erosion is identified to suggest osteomyelitis. IMPRESSION: 1. Subcutaneous gas within the soft tissues lateral and plantar to the fifth tarsometatarsal joint, reflective of local trauma and/or infection. No superimposed osseous erosion identified to suggest osteomyelitis. Electronically Signed   By: Helyn Numbers M.D.   On: 07/22/2022 19:25   DG Chest Portable 1 View  Result Date: 07/22/2022 CLINICAL DATA:  Sepsis EXAM: PORTABLE CHEST 1 VIEW COMPARISON:  06/20/2022 FINDINGS: Lungs are clear. No pneumothorax or pleural effusion. Coronary artery bypass grafting has been performed. Cardiac size within  normal limits. Pulmonary vascularity is  normal. No acute bone abnormality. IMPRESSION: 1. No active disease. Electronically Signed   By: Helyn Numbers M.D.   On: 07/22/2022 19:22      Subjective: Patient seen and examined at bedside.  Denies worsening shortness of breath, fever, vomiting or chest pain.   Discharge Exam: Vitals:   07/29/22 0522 07/29/22 0908  BP: (!) 162/71 (!) 167/75  Pulse: 75 72  Resp: 18 20  Temp: 98.3 F (36.8 C) 98.6 F (37 C)  SpO2: 95%     General: On room air.  No distress.  Looks chronically ill and deconditioned. respiratory: Decreased breath sounds at bases bilaterally with some crackles CVS: Currently rate controlled; S1-S2 heard  abdominal: Soft, nontender, slightly distended, no organomegaly; normal bowel sounds are heard  extremities: Left BKA with dressing present; left BKA    The results of significant diagnostics from this hospitalization (including imaging, microbiology, ancillary and laboratory) are listed below for reference.     Microbiology: Recent Results (from the past 240 hour(s))  Culture, blood (Routine x 2)     Status: None   Collection Time: 07/22/22  6:33 PM   Specimen: BLOOD  Result Value Ref Range Status   Specimen Description   Final    BLOOD BLOOD RIGHT FOREARM Performed at [facility], [address] [place], [place] [address]    Special Requests   Final    Blood Culture adequate volume BOTTLES DRAWN AEROBIC AND ANAEROBIC Performed at [facility], [address], [place], [place] [address]    Culture   Final    NO GROWTH 5 DAYS Performed at [facility], [address], [place], [place] [address]    Report Status 07/27/2022 FINAL  Final  Culture, blood (Routine x 2)     Status: None   Collection Time: 07/22/22  6:35 PM   Specimen: BLOOD  Result Value Ref Range Status   Specimen Description   Final    BLOOD BLOOD RIGHT ARM Performed at [facility], [address],  [place], [place] [address]    Special Requests   Final    Blood Culture adequate volume BOTTLES DRAWN AEROBIC AND ANAEROBIC Performed at [facility], [address] [place], [place] [address]    Culture   Final    NO GROWTH 5 DAYS Performed at [facility], [address] [place], [place] [address]    Report Status 07/27/2022 FINAL  Final  Resp panel by RT-PCR (RSV, Flu A&B, Covid) Anterior Nasal Swab     Status: None   Collection Time: 07/22/22  6:36 PM   Specimen: Anterior Nasal Swab  Result Value Ref Range Status   SARS Coronavirus 2 by RT PCR NEGATIVE NEGATIVE Final    Comment: (NOTE) SARS-CoV-2 target nucleic acids are NOT DETECTED.  The SARS-CoV-2 RNA is generally detectable in upper respiratory specimens during the acute phase of infection. The lowest concentration of SARS-CoV-2 viral copies this assay can detect is 138 copies/mL. A negative result does not preclude SARS-Cov-2 infection and should not be used as the sole basis for treatment or other patient management decisions. A negative result may occur with  improper specimen collection/handling, submission of specimen other than nasopharyngeal swab, presence of viral mutation(s) within the areas targeted by this assay, and inadequate number of viral copies(<138 copies/mL). A negative result must be combined with clinical observations, patient history, and epidemiological information. The expected result is Negative.  Fact Sheet for Patients:  BloggerCourse.com  Fact Sheet for Healthcare Providers:  SeriousBroker.it  This test is no t yet approved or cleared by the [place] FDA and  has been authorized for detection and/or diagnosis of SARS-CoV-2 by FDA under an Emergency Use Authorization (EUA). This EUA will remain  in effect (meaning this test can be used) for the duration of the COVID-19 declaration under Section 564(b)(1) of the Act,  21 U.S.C.section 360bbb-3(b)(1), unless the authorization is terminated  or revoked sooner.       Influenza A by PCR NEGATIVE NEGATIVE Final   Influenza B by PCR NEGATIVE NEGATIVE Final    Comment: (NOTE) The Xpert Xpress SARS-CoV-2/FLU/RSV plus assay is intended as an aid in the diagnosis of influenza from Nasopharyngeal swab specimens and should not be used as a sole basis for treatment. Nasal washings and aspirates are unacceptable for Xpert Xpress SARS-CoV-2/FLU/RSV testing.  Fact Sheet for Patients: BloggerCourse.com  Fact Sheet for Healthcare Providers: SeriousBroker.it  This test is not yet approved or cleared by the [place] FDA and has been authorized for detection and/or diagnosis of SARS-CoV-2 by FDA under an Emergency Use Authorization (EUA). This EUA will remain in effect (meaning this test can be used) for the duration of the COVID-19 declaration under Section 564(b)(1) of the Act, 21 U.S.C. section 360bbb-3(b)(1), unless the authorization is terminated or revoked.     Resp Syncytial Virus by PCR NEGATIVE NEGATIVE Final    Comment: (NOTE) Fact Sheet for Patients: BloggerCourse.com  Fact Sheet for Healthcare Providers: SeriousBroker.it  This test is not yet approved or cleared by the [place] FDA and has been authorized for detection and/or diagnosis of SARS-CoV-2 by FDA under an Emergency Use Authorization (EUA). This EUA will remain in effect (meaning this test can be used) for the duration of the COVID-19 declaration under Section 564(b)(1) of the Act, 21 U.S.C. section 360bbb-3(b)(1), unless the authorization is terminated or revoked.  Performed at [facility], [address] [place], [place] [address]   MRSA Next Gen by PCR, Nasal     Status: None   Collection Time: 07/23/22  2:30 AM   Specimen: Nasal Mucosa; Nasal Swab   Result Value Ref Range Status   MRSA by PCR Next Gen NOT DETECTED NOT DETECTED Final    Comment: (NOTE) The GeneXpert MRSA Assay (FDA approved for NASAL specimens only), is one component of a comprehensive MRSA colonization surveillance program. It is not intended to diagnose MRSA infection nor to guide or monitor treatment for MRSA infections. Test performance is not FDA approved in patients less than 4 years old. Performed at [facility], [address] [place], [place] [address]      Labs: BNP (last 3 results) No results for input(s): "BNP" in the last 8760 hours. Basic Metabolic Panel: Recent Labs  Lab 07/22/22 1832 07/22/22 1903 07/23/22 0023 07/23/22 0337 07/25/22 0218 07/26/22 1018 07/27/22 0801  NA 132*   < > 129* 130* 135 135 132*  K 4.4   < > 3.9 3.8 3.5 3.7 3.8  CL 89*  --   --  91* 96* 95* 94*  CO2 28  --   --  26 27  --  24  GLUCOSE 125*  --   --  130* 182* 105* 160*  BUN 48*  --   --  49* 50* 32* 49*  CREATININE 7.55*  --   --  8.12* 7.95* 6.50* 7.54*  CALCIUM 8.7*  --   --  7.8* 7.9*  --  7.9*  MG  --   --   --  1.6* 2.0  --   --   PHOS  --   --   --  5.0*  --   --  4.2   < > = values in this interval not displayed.   Liver Function Tests: Recent Labs  Lab 07/22/22 1832 07/27/22 0801  AST 27  --   ALT 16  --   ALKPHOS 169*  --   BILITOT 1.4*  --   PROT 6.3*  --   ALBUMIN 3.9 2.9*   Recent Labs  Lab 07/22/22 1832  LIPASE 82*   No results for input(s): "AMMONIA" in the last 168 hours. CBC: Recent Labs  Lab 07/22/22 1832 07/22/22 1903 07/23/22 0337 07/25/22 0218 07/25/22 1359 07/26/22 1018 07/27/22 0801  WBC 7.1  --  8.4 5.5  --   --  7.9  NEUTROABS 6.0  --   --  4.5  --   --   --   HGB 7.9*   < > 7.8* 7.8* 9.7* 10.9* 9.8*  HCT 23.6*   < > 23.6* 23.5* 28.4* 32.0* 29.3*  MCV 88.1  --  90.4 90.4  --   --  89.1  PLT 88*  --  76* 91*  --   --  126*   < > = values in this interval not displayed.   Cardiac Enzymes: No results  for input(s): "CKTOTAL", "CKMB", "CKMBINDEX", "TROPONINI" in the last 168 hours. BNP: Invalid input(s): "POCBNP" CBG: Recent Labs  Lab 07/28/22 0722 07/28/22 1106 07/28/22 1640 07/28/22 2034 07/29/22 0719  GLUCAP 129* 164* 158* 125* 113*   D-Dimer No results for input(s): "DDIMER" in the last 72 hours. Hgb A1c No results for input(s): "HGBA1C" in the last 72 hours. Lipid Profile No results for input(s): "CHOL", "HDL", "LDLCALC", "TRIG", "CHOLHDL", "LDLDIRECT" in the last 72 hours. Thyroid function studies No results for input(s): "TSH", "T4TOTAL", "T3FREE", "THYROIDAB" in the last 72 hours.  Invalid input(s): "FREET3" Anemia work up No results for input(s): "VITAMINB12", "FOLATE", "FERRITIN", "TIBC", "IRON", "RETICCTPCT" in the last 72 hours. Urinalysis    Component Value Date/Time   COLORURINE YELLOW 07/22/2022 2356   APPEARANCEUR CLEAR 07/22/2022 2356   LABSPEC 1.022 07/22/2022 2356   PHURINE 6.5 07/22/2022 2356   GLUCOSEU NEGATIVE 07/22/2022 2356   HGBUR SMALL (A) 07/22/2022 2356   BILIRUBINUR NEGATIVE 07/22/2022 2356   KETONESUR NEGATIVE 07/22/2022 2356   PROTEINUR >300 (A) 07/22/2022 2356   UROBILINOGEN 0.2 12/28/2014 1031   NITRITE NEGATIVE 07/22/2022 2356   LEUKOCYTESUR TRACE (A) 07/22/2022 2356   Sepsis Labs Recent Labs  Lab 07/22/22 1832 07/23/22 0337 07/25/22 0218 07/27/22 0801  WBC 7.1 8.4 5.5 7.9   Microbiology Recent Results (from the past 240 hour(s))  Culture, blood (Routine x 2)     Status: None   Collection Time: 07/22/22  6:33 PM   Specimen: BLOOD  Result Value Ref Range Status   Specimen Description   Final    BLOOD BLOOD RIGHT FOREARM Performed at [facility], [address] [place], [place] [address]    Special Requests   Final    Blood Culture adequate volume BOTTLES DRAWN AEROBIC AND ANAEROBIC Performed at [facility], [address] [place], [place] [address]    Culture   Final    NO  GROWTH 5 DAYS Performed at [facility], [address] [place], [place] [address]  Report Status 07/27/2022 FINAL  Final  Culture, blood (Routine x 2)     Status: None   Collection Time: 07/22/22  6:35 PM   Specimen: BLOOD  Result Value Ref Range Status   Specimen Description   Final    BLOOD BLOOD RIGHT ARM Performed at [facility], [address] [place], [place] [address]    Special Requests   Final    Blood Culture adequate volume BOTTLES DRAWN AEROBIC AND ANAEROBIC Performed at [facility], [address] [place], [place] [address]    Culture   Final    NO GROWTH 5 DAYS Performed at [facility], [address] [place], [place] [address]    Report Status 07/27/2022 FINAL  Final  Resp panel by RT-PCR (RSV, Flu A&B, Covid) Anterior Nasal Swab     Status: None   Collection Time: 07/22/22  6:36 PM   Specimen: Anterior Nasal Swab  Result Value Ref Range Status   SARS Coronavirus 2 by RT PCR NEGATIVE NEGATIVE Final    Comment: (NOTE) SARS-CoV-2 target nucleic acids are NOT DETECTED.  The SARS-CoV-2 RNA is generally detectable in upper respiratory specimens during the acute phase of infection. The lowest concentration of SARS-CoV-2 viral copies this assay can detect is 138 copies/mL. A negative result does not preclude SARS-Cov-2 infection and should not be used as the sole basis for treatment or other patient management decisions. A negative result may occur with  improper specimen collection/handling, submission of specimen other than nasopharyngeal swab, presence of viral mutation(s) within the areas targeted by this assay, and inadequate number of viral copies(<138 copies/mL). A negative result must be combined with clinical observations, patient history, and epidemiological information. The expected result is Negative.  Fact Sheet for Patients:  BloggerCourse.com  Fact Sheet for  Healthcare Providers:  SeriousBroker.it  This test is no t yet approved or cleared by the [place] FDA and  has been authorized for detection and/or diagnosis of SARS-CoV-2 by FDA under an Emergency Use Authorization (EUA). This EUA will remain  in effect (meaning this test can be used) for the duration of the COVID-19 declaration under Section 564(b)(1) of the Act, 21 U.S.C.section 360bbb-3(b)(1), unless the authorization is terminated  or revoked sooner.       Influenza A by PCR NEGATIVE NEGATIVE Final   Influenza B by PCR NEGATIVE NEGATIVE Final    Comment: (NOTE) The Xpert Xpress SARS-CoV-2/FLU/RSV plus assay is intended as an aid in the diagnosis of influenza from Nasopharyngeal swab specimens and should not be used as a sole basis for treatment. Nasal washings and aspirates are unacceptable for Xpert Xpress SARS-CoV-2/FLU/RSV testing.  Fact Sheet for Patients: BloggerCourse.com  Fact Sheet for Healthcare Providers: SeriousBroker.it  This test is not yet approved or cleared by the [place] FDA and has been authorized for detection and/or diagnosis of SARS-CoV-2 by FDA under an Emergency Use Authorization (EUA). This EUA will remain in effect (meaning this test can be used) for the duration of the COVID-19 declaration under Section 564(b)(1) of the Act, 21 U.S.C. section 360bbb-3(b)(1), unless the authorization is terminated or revoked.     Resp Syncytial Virus by PCR NEGATIVE NEGATIVE Final    Comment: (NOTE) Fact Sheet for Patients: BloggerCourse.com  Fact Sheet for Healthcare Providers: SeriousBroker.it  This test is not yet approved or cleared by the [place] FDA and has been authorized for detection and/or diagnosis of SARS-CoV-2 by FDA under an Emergency Use Authorization (EUA). This  EUA will remain in effect (meaning this  test can be used) for the duration of the COVID-19 declaration under Section 564(b)(1) of the Act, 21 U.S.C. section 360bbb-3(b)(1), unless the authorization is terminated or revoked.  Performed at [facility], [address], [place], [place] [address]   MRSA Next Gen by PCR, Nasal     Status: None   Collection Time: 07/23/22  2:30 AM   Specimen: Nasal Mucosa; Nasal Swab  Result Value Ref Range Status   MRSA by PCR Next Gen NOT DETECTED NOT DETECTED Final    Comment: (NOTE) The GeneXpert MRSA Assay (FDA approved for NASAL specimens only), is one component of a comprehensive MRSA colonization surveillance program. It is not intended to diagnose MRSA infection nor to guide or monitor treatment for MRSA infections. Test performance is not FDA approved in patients less than 11 years old. Performed at [facility], [address] [place], [place] [address]      Time coordinating discharge: 35 minutes  SIGNED:   Glade Lloyd, MD  [facility] 07/29/2022, 10:18 AM

## 2022-07-29 NOTE — Progress Notes (Signed)
Mobility Specialist Progress Note   07/29/22 1450  Mobility  Activity Transferred to/from BSC (Transferred from BR)  Level of Assistance Moderate assist, patient does 50-74%  Assistive Device Stedy  RLE Weight Bearing NWB  Activity Response Tolerated well  Mobility Referral Yes  $Mobility charge 1 Mobility   Received pt in BR Ready to get back to w/c. Required modA to stand and receive pericare, pt able to tolerate ~67mins of standing. Returned back to w/c w/o fault, call bell in reach and needs met.  Frederico Hamman Mobility Specialist Please contact via SecureChat or  Rehab office at [phone]

## 2022-07-29 NOTE — Progress Notes (Addendum)
Inpatient Rehab Admissions Coordinator:  Holding admission d/t bed availability. Will follow up tomorrow.   ADDENDUM: 1704: A bed became available in CIR, pt will admit to CIR today.  Wolfgang Phoenix, MS, CCC-SLP Admissions Coordinator [phone]

## 2022-07-29 NOTE — Progress Notes (Signed)
Patient transferred to 4W25 via wheelchair. VSS. No complaints of pain. Report at called Tomitha.

## 2022-07-29 NOTE — Progress Notes (Signed)
Inpatient Rehab Admissions Coordinator:  There is a bed available for pt in CIR today. Dr. Hanley Ben is aware and in agreement. Pt, pt's wife Aurea Graff, NSG, and TOC made aware.   Wolfgang Phoenix, MS, CCC-SLP Admissions Coordinator [phone]

## 2022-07-29 NOTE — Progress Notes (Signed)
Mobility Specialist Progress Note   07/29/22 1350  Mobility  Activity Transferred to/from BSC (Transferred to BR)  Level of Assistance Moderate assist, patient does 50-74%  Assistive Device Stedy  RLE Weight Bearing NWB  Activity Response Tolerated well  Mobility Referral Yes  $Mobility charge 1 Mobility   RN requesting assistance to get pt to BR. ModA to stand in stedy d/t limited strength in LLE w/ cues on hip extension. Transferred to BR w/o fault. Left in BR w/ RN aware of pt's position.   Frederico Hamman Mobility Specialist Please contact via SecureChat or  Rehab office at [phone]

## 2022-07-29 NOTE — Progress Notes (Signed)
Physical Therapy Treatment Patient Details Name: [name] MRN: [id number] DOB: 09-09-1950 Today's Date: 07/29/2022   History of Present Illness 72 year old male admitted 4/15 with with altered mental status/weakness and hypoxia, found to be septic. S/p right below knee amputation 4/19. PMH of hypertension, hyperlipidemia, CAD, diabetes mellitus type 2, peripheral vascular disease, Left BKA, end-stage renal disease on home hemodialysis, hypothyroidism, prostate cancer.    PT Comments    Pt greeted up in [facility] on arrival and agreeable to session with steady progress towards acute goals. Pt able to come to stand from low recliner with min A with increased cues for hand placement and weight shift to L. Pt able to complete pre-gait activities with RW support, shifting weight into Ues to un weight LLE with pt able to minimally clear foot. Pt continues to be motivated to progress and current plan remains appropriate to address deficits and maximize functional independence and decrease caregiver burden. Pt continues to benefit from skilled PT services to progress toward functional mobility goals.    Recommendations for follow up therapy are one component of a multi-disciplinary discharge planning process, led by the attending physician.  Recommendations may be updated based on patient status, additional functional criteria and insurance authorization.  Follow Up Recommendations  Can patient physically be transported by private vehicle: No    Assistance Recommended at Discharge Intermittent Supervision/Assistance  Patient can return home with the following A lot of help with bathing/dressing/bathroom;A lot of help with walking and/or transfers;Assistance with cooking/housework;Assist for transportation;Help with stairs or ramp for entrance   Equipment Recommendations  None recommended by PT    Recommendations for Other Services       Precautions / Restrictions Precautions Precautions: Fall;Other  (comment) Precaution Comments: R LE wound vac Restrictions Weight Bearing Restrictions: Yes RLE Weight Bearing: Non weight bearing     Mobility  Bed Mobility Overal bed mobility: Needs Assistance             General bed mobility comments: pt up in [facility] on arrival and recliner chair at end of session    Transfers Overall transfer level: Needs assistance Equipment used: Rolling walker (2 wheels), Ambulation equipment used Transfers: Bed to chair/wheelchair/BSC, Sit to/from Stand Sit to Stand: Mod assist, Min assist           General transfer comment: pt up in wheelchair on arrival, utilized stedy to trasnfer to [facility] over commode in bathroom dur to tight quarters for stand pivot trasnfer. pt needing mod A to stand from [facility] initially down to min A from recliner to RW with cues for hand placement and weight shift to L Transfer via Lift Equipment: Stedy  Ambulation/Gait             Pre-gait activities: weight shift from arms to Lt prosthetic, able to clear foot and briefly maintain WB through UEs only on walker.     Stairs             Wheelchair Mobility    Modified Rankin (Stroke Patients Only)       Balance Overall balance assessment: Needs assistance Sitting-balance support: No upper extremity supported, Feet supported Sitting balance-Leahy Scale: Good Sitting balance - Comments: Sat EOB with min guard supervision, was able to donne prosthesis on Lt independently.   Standing balance support: Bilateral upper extremity supported, During functional activity Standing balance-Leahy Scale: Poor  Cognition Arousal/Alertness: Awake/alert Behavior During Therapy: WFL for tasks assessed/performed, Restless Overall Cognitive Status: No family/caregiver present to determine baseline cognitive functioning Area of Impairment: Attention, Following commands, Safety/judgement, Awareness, Problem solving                    Current Attention Level: Sustained   Following Commands: Follows one step commands with increased time Safety/Judgement: Decreased awareness of safety Awareness: Emergent Problem Solving: Slow processing, Requires verbal cues General Comments: easily distracted and does benefit from cues for problem solving and staying on task. appears somewhat confused.        Exercises      General Comments        Pertinent Vitals/Pain Pain Assessment Pain Assessment: No/denies pain Pain Intervention(s): Monitored during session    Home Living                          Prior Function            PT Goals (current goals can now be found in the care plan section) Acute Rehab PT Goals Patient Stated Goal: Get rehab go home. PT Goal Formulation: With patient Time For Goal Achievement: 08/09/22 Progress towards PT goals: Progressing toward goals    Frequency    Min 4X/week      PT Plan      Co-evaluation              AM-PAC PT "6 Clicks" Mobility   Outcome Measure  Help needed turning from your back to your side while in a flat bed without using bedrails?: A Little Help needed moving from lying on your back to sitting on the side of a flat bed without using bedrails?: A Little Help needed moving to and from a bed to a chair (including a wheelchair)?: A Lot Help needed standing up from a chair using your arms (e.g., wheelchair or bedside chair)?: A Lot Help needed to walk in hospital room?: Total Help needed climbing 3-5 steps with a railing? : Total 6 Click Score: 12    End of Session Equipment Utilized During Treatment: Gait belt Activity Tolerance: Patient tolerated treatment well Patient left: in chair;with call bell/phone within reach;with chair alarm set (Rt knee extended) Nurse Communication: Mobility status PT Visit Diagnosis: Unsteadiness on feet (R26.81);Muscle weakness (generalized) (M62.81);Difficulty in walking, not elsewhere classified  (R26.2)     Time: 6045-4098 PT Time Calculation (min) (ACUTE ONLY): 41 min  Charges:  $Gait Training: 8-22 mins $Therapeutic Activity: 23-37 mins                     Lunetta Marina R. PTA Acute Rehabilitation Services Office: [phone]    [name] 07/29/2022, 4:30 PM

## 2022-07-29 NOTE — Plan of Care (Signed)
  Problem: Health Behavior/Discharge Planning: Goal: Ability to manage health-related needs will improve Outcome: Progressing   Problem: Clinical Measurements: Goal: Ability to maintain clinical measurements within normal limits will improve Outcome: Progressing Goal: Will remain free from infection Outcome: Progressing Goal: Diagnostic test results will improve Outcome: Progressing Goal: Respiratory complications will improve Outcome: Progressing Goal: Cardiovascular complication will be avoided Outcome: Progressing   Problem: Activity: Goal: Risk for activity intolerance will decrease Outcome: Progressing   Problem: Nutrition: Goal: Adequate nutrition will be maintained Outcome: Progressing   Problem: Coping: Goal: Level of anxiety will decrease Outcome: Progressing   Problem: Elimination: Goal: Will not experience complications related to bowel motility Outcome: Progressing Goal: Will not experience complications related to urinary retention Outcome: Progressing   Problem: Pain Managment: Goal: General experience of comfort will improve Outcome: Progressing   Problem: Safety: Goal: Ability to remain free from injury will improve Outcome: Progressing   Problem: Skin Integrity: Goal: Risk for impaired skin integrity will decrease Outcome: Progressing   Problem: Education: Goal: Ability to describe self-care measures that may prevent or decrease complications (Diabetes Survival Skills Education) will improve Outcome: Progressing Goal: Individualized Educational Video(s) Outcome: Progressing   Problem: Coping: Goal: Ability to adjust to condition or change in health will improve Outcome: Progressing   Problem: Fluid Volume: Goal: Ability to maintain a balanced intake and output will improve Outcome: Progressing   Problem: Health Behavior/Discharge Planning: Goal: Ability to identify and utilize available resources and services will improve Outcome:  Progressing Goal: Ability to manage health-related needs will improve Outcome: Progressing   Problem: Metabolic: Goal: Ability to maintain appropriate glucose levels will improve Outcome: Progressing   Problem: Nutritional: Goal: Maintenance of adequate nutrition will improve Outcome: Progressing Goal: Progress toward achieving an optimal weight will improve Outcome: Progressing   Problem: Skin Integrity: Goal: Risk for impaired skin integrity will decrease Outcome: Progressing   Problem: Tissue Perfusion: Goal: Adequacy of tissue perfusion will improve Outcome: Progressing   Problem: Education: Goal: Knowledge of the prescribed therapeutic regimen will improve Outcome: Progressing Goal: Ability to verbalize activity precautions or restrictions will improve Outcome: Progressing Goal: Understanding of discharge needs will improve Outcome: Progressing   Problem: Activity: Goal: Ability to perform//tolerate increased activity and mobilize with assistive devices will improve Outcome: Progressing   Problem: Clinical Measurements: Goal: Postoperative complications will be avoided or minimized Outcome: Progressing   Problem: Self-Care: Goal: Ability to meet self-care needs will improve Outcome: Progressing   Problem: Self-Concept: Goal: Ability to maintain and perform role responsibilities to the fullest extent possible will improve Outcome: Progressing   Problem: Pain Management: Goal: Pain level will decrease with appropriate interventions Outcome: Progressing   Problem: Skin Integrity: Goal: Demonstration of wound healing without infection will improve Outcome: Progressing   

## 2022-07-29 NOTE — TOC Transition Note (Signed)
Transition of Care [facility]) - CM/SW Discharge Note   Patient Details  Name: [name] MRN: [id number] Date of Birth: 1950/11/16  Transition of [facility]) CM/SW Contact:  Tom-Johnson, Hershal Coria, RN Phone Number: 07/29/2022, 10:40 AM   Clinical Narrative:     Patient is scheduled for discharge to CIR today. In-hospital transfer via bed.  No further TOC needs noted.         Final next level of care: IP Rehab Facility Barriers to Discharge: Barriers Resolved   Patient Goals and CMS Choice CMS Medicare.gov Compare Post Acute Care list provided to:: Patient Choice offered to / list presented to : Patient  Discharge Placement                  Patient to be transferred to facility by: In-hospital transfer      Discharge Plan and Services Additional resources added to the After Visit Summary for                  DME Arranged: N/A DME Agency: NA       HH Arranged: NA HH Agency: NA        Social Determinants of Health (SDOH) Interventions SDOH Screenings   Food Insecurity: Patient Declined (07/28/2022)  Housing: Low Risk  (07/28/2022)  Transportation Needs: No Transportation Needs (07/28/2022)  Utilities: Not At Risk (07/28/2022)  Depression (PHQ2-9): Low Risk  (03/11/2022)  Tobacco Use: Medium Risk (07/26/2022)     Readmission Risk Interventions    07/29/2022   10:39 AM  Readmission Risk Prevention Plan  Transportation Screening Complete  PCP or Specialist appointment within 3-5 days of discharge Complete  SW Recovery Care/Counseling Consult Complete  Skilled Nursing Facility Not Applicable

## 2022-07-29 NOTE — Progress Notes (Signed)
PT Cancellation Note  Patient Details Name: [name] MRN: [id number] DOB: [birth year]   Cancelled Treatment:    Reason Eval/Treat Not Completed: (P) Other (comment), pt working with MT. Will check back as schedule allows to continue with PT POC.  [name]. PTA Acute Rehabilitation Services Office: [phone]    [name] 07/29/2022, 11:08 AM

## 2022-07-29 NOTE — Progress Notes (Signed)
Mobility Specialist Progress Note   07/29/22 1141  Mobility  Activity Transferred from bed to chair (Propelled in hallway)  Level of Assistance Moderate assist, patient does 50-74%  Assistive Device Front wheel walker;Wheelchair  Distance Ambulated (ft)  (Propelled 211ft)  RLE Weight Bearing NWB  Activity Response Tolerated well  Mobility Referral Yes  $Mobility charge 1 Mobility   Received pt in bed eager to get out of room and having no complaints. Pt able to get EOB w/ minA + support from pad to maneuver trunk. X2 attempts to stand and pivot to w/c which required modA from an elevated surface. Min cues needed when propelling to avoid obstacles in room but pt having no issues when ambulating in hallway. Returned back to room w/o fault and left w/ son to continue propelling per pt's request.   Frederico Hamman Mobility Specialist Please contact via SecureChat or  Rehab office at [phone]

## 2022-07-29 NOTE — Plan of Care (Signed)
  Problem: Health Behavior/Discharge Planning: Goal: Ability to manage health-related needs will improve Outcome: Adequate for Discharge   Problem: Clinical Measurements: Goal: Ability to maintain clinical measurements within normal limits will improve Outcome: Adequate for Discharge Goal: Will remain free from infection Outcome: Adequate for Discharge Goal: Diagnostic test results will improve Outcome: Adequate for Discharge Goal: Respiratory complications will improve Outcome: Adequate for Discharge Goal: Cardiovascular complication will be avoided Outcome: Adequate for Discharge   Problem: Activity: Goal: Risk for activity intolerance will decrease Outcome: Adequate for Discharge   Problem: Nutrition: Goal: Adequate nutrition will be maintained Outcome: Adequate for Discharge   Problem: Coping: Goal: Level of anxiety will decrease Outcome: Adequate for Discharge   Problem: Pain Managment: Goal: General experience of comfort will improve Outcome: Adequate for Discharge   Problem: Safety: Goal: Ability to remain free from injury will improve Outcome: Adequate for Discharge   Problem: Skin Integrity: Goal: Risk for impaired skin integrity will decrease Outcome: Adequate for Discharge   Problem: Education: Goal: Ability to describe self-care measures that may prevent or decrease complications (Diabetes Survival Skills Education) will improve Outcome: Adequate for Discharge Goal: Individualized Educational Video(s) Outcome: Adequate for Discharge   Problem: Coping: Goal: Ability to adjust to condition or change in health will improve Outcome: Adequate for Discharge   Problem: Fluid Volume: Goal: Ability to maintain a balanced intake and output will improve Outcome: Adequate for Discharge   Problem: Health Behavior/Discharge Planning: Goal: Ability to identify and utilize available resources and services will improve Outcome: Adequate for Discharge Goal: Ability  to manage health-related needs will improve Outcome: Adequate for Discharge   Problem: Metabolic: Goal: Ability to maintain appropriate glucose levels will improve Outcome: Adequate for Discharge   Problem: Nutritional: Goal: Maintenance of adequate nutrition will improve Outcome: Adequate for Discharge Goal: Progress toward achieving an optimal weight will improve Outcome: Adequate for Discharge   Problem: Skin Integrity: Goal: Risk for impaired skin integrity will decrease Outcome: Adequate for Discharge   Problem: Tissue Perfusion: Goal: Adequacy of tissue perfusion will improve Outcome: Adequate for Discharge   Problem: Education: Goal: Knowledge of the prescribed therapeutic regimen will improve Outcome: Adequate for Discharge Goal: Ability to verbalize activity precautions or restrictions will improve Outcome: Adequate for Discharge Goal: Understanding of discharge needs will improve Outcome: Adequate for Discharge   Problem: Activity: Goal: Ability to perform//tolerate increased activity and mobilize with assistive devices will improve Outcome: Adequate for Discharge   Problem: Clinical Measurements: Goal: Postoperative complications will be avoided or minimized Outcome: Adequate for Discharge   Problem: Self-Care: Goal: Ability to meet self-care needs will improve Outcome: Adequate for Discharge   Problem: Self-Concept: Goal: Ability to maintain and perform role responsibilities to the fullest extent possible will improve Outcome: Adequate for Discharge   Problem: Pain Management: Goal: Pain level will decrease with appropriate interventions Outcome: Adequate for Discharge   Problem: Skin Integrity: Goal: Demonstration of wound healing without infection will improve Outcome: Adequate for Discharge

## 2022-07-29 NOTE — Progress Notes (Signed)
Signed     PMR Admission Coordinator Pre-Admission Assessment   Patient: [name] is an 72 y.o., male MRN: [id number] DOB: Aug 19, 1950 Height: 5\' 10"  (177.8 cm) Weight: 87.8 kg   Insurance Information HMO:     PPO:      PCP:      IPA:      80/20: yes     OTHER:  PRIMARY: Medicare A & B       Policy#: [id number]      Subscriber: patient CM Name:       Phone#:      Fax#:  Pre-Cert#:       Employer:  Benefits:  Phone #:      Name:  Eff. Date: Part A effective 08/07/14, Part B effective 09/07/18     Deduct: $1,632      Out of Pocket Max: NA      Life Max: NA CIR: 100% coverage      SNF: 100% coverage days 1-20, 80% coverage days 21-100 Outpatient: 80% coverage     Co-Pay: 20% Home Health: 100% coverage      Co-Pay:  DME: 80% coverage     Co-Pay: 20% Providers: pt's choice SECONDARY: BCBS Supplement      Policy#: [id number]     Phone#: [phone]   Financial Counselor:       Phone#:    The "Data Collection Information Summary" for patients in Inpatient Rehabilitation Facilities with attached "Privacy Act Statement-Health Care Records" was provided and verbally reviewed with: Patient   Emergency Contact Information Contact Information       Name Relation Home Work Mobile    [name] Spouse     [phone]    [name], [name]     [phone]           Current Medical History  Patient Admitting Diagnosis: R BKA History of Present Illness: Pt is a 72 year old male with medical hx significant for: ESRD on home dialysis 3x weekly, HLD, HTN, hypothyroid, prostate CA, CABG, chronic wound right foot, s/p L BKA secondary to osteomyelitis, right great toe amputation. Pt [facility] on 07/22/22 d/t c/o AMS, dyspnea, fever. Code sepsis initiated. Pt noted to be hypoxic and in respiratory distress on arrival (60% on room air). Placed on nonrebreather and then CPAP with improvement. CT head and CT chest/abdomen/pelvis negative for acute  abnormalities.. Pt continued to have a decline in mental status and respiratory status. Pt emergently intubated. Right foot x-ray revealed cellulitis. Pt transferred to [facility] on 07/23/22. Pt extubated on 07/23/22. Orthopedics consulted. Pt underwent right BKA on 07/26/22 by Dr. Lajoyce Corners. Therapy evaluations  completed and CIR recommended d/t pt's deficits in functional mobility and inability to complete ADLs independently.    Patient's medical record from [facility] has been reviewed by the rehabilitation admission coordinator and physician.   Past Medical History      Past Medical History:  Diagnosis Date   Acute osteomyelitis of metatarsal bone of left foot     Ambulates with cane      straight cane   Anemia     Cervical myelopathy 02/06/2018   Chronic kidney disease      dailysis M W F- home   Community acquired pneumonia of left lower lobe of lung 08/18/2021   Complication of anesthesia     Coronary artery disease     Dehiscence of amputation stump of left lower extremity  Diabetes mellitus without complication      type2   Diabetic foot ulcer     Diabetic neuropathy 02/06/2018   Dysrhythmia     Gait abnormality 08/24/2018   GERD (gastroesophageal reflux disease)       01/06/20- not current   Gout     History of blood transfusion     History of blood transfusion     History of kidney stones      passed stones   Hypercholesteremia     Hypertension     Hypothyroidism     Neuromuscular disorder      neuropathy left leg and bilateral feet   Neuropathy     Partial nontraumatic amputation of foot, 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   PONV (postoperative nausea and vomiting)     Prostate cancer     PVD (peripheral vascular disease)      with amputations      Has the patient had major surgery during 100 days prior to admission? Yes   Family History   family history includes CAD in his father; Cancer in his father; Diabetes Mellitus II in his brother, brother, father, and  mother; Kidney disease in his brother, brother, father, and mother; Stomach cancer (age of onset: 16) in his brother.   Current Medications   Current Facility-Administered Medications:    acetaminophen (TYLENOL) tablet 650 mg, 650 mg, Oral, Q4H PRN, Nadara Mustard, MD, 650 mg at 07/27/22 1328   alum & mag hydroxide-simeth (MAALOX/MYLANTA) 200-200-20 MG/5ML suspension 15-30 mL, 15-30 mL, Oral, Q2H PRN, Nadara Mustard, MD   ascorbic acid (VITAMIN C) tablet 1,000 mg, 1,000 mg, Oral, Daily, Nadara Mustard, MD, 1,000 mg at 07/29/22 1610   bisacodyl (DULCOLAX) EC tablet 5 mg, 5 mg, Oral, Daily PRN, Nadara Mustard, MD   carvedilol (COREG) tablet 12.5 mg, 12.5 mg, Oral, BID WC, Alekh, Kshitiz, MD, 12.5 mg at 07/29/22 9604   Darbepoetin Alfa (ARANESP) injection 100 mcg, 100 mcg, Subcutaneous, Q Thu-1800, Nadara Mustard, MD, 100 mcg at 07/25/22 1721   docusate sodium (COLACE) capsule 100 mg, 100 mg, Oral, BID, Nadara Mustard, MD, 100 mg at 07/29/22 0817   feeding supplement (NEPRO CARB STEADY) liquid 237 mL, 237 mL, Oral, TID BM, Alekh, Kshitiz, MD, 237 mL at 07/29/22 5409   guaiFENesin-dextromethorphan (ROBITUSSIN DM) 100-10 MG/5ML syrup 15 mL, 15 mL, Oral, Q4H PRN, Nadara Mustard, MD   heparin injection 5,000 Units, 5,000 Units, Subcutaneous, Q8H, Nadara Mustard, MD, 5,000 Units at 07/29/22 0604   hydrALAZINE (APRESOLINE) injection 5 mg, 5 mg, Intravenous, Q20 Min PRN, Nadara Mustard, MD   HYDROmorphone (DILAUDID) injection 0.5-1 mg, 0.5-1 mg, Intravenous, Q4H PRN, Nadara Mustard, MD   insulin aspart (novoLOG) injection 0-5 Units, 0-5 Units, Subcutaneous, QHS, Hall, Carole N, DO   insulin aspart (novoLOG) injection 0-9 Units, 0-9 Units, Subcutaneous, TID WC, Hall, Carole N, DO, 2 Units at 07/28/22 1710   labetalol (NORMODYNE) injection 10 mg, 10 mg, Intravenous, Q10 min PRN, Nadara Mustard, MD   levothyroxine (SYNTHROID) tablet 112 mcg, 112 mcg, Oral, Q0600, Nadara Mustard, MD, 112 mcg at 07/29/22 0603    magnesium citrate solution 1 Bottle, 1 Bottle, Oral, Once PRN, Nadara Mustard, MD   magnesium sulfate IVPB 2 g 50 mL, 2 g, Intravenous, Daily PRN, Nadara Mustard, MD   metoprolol tartrate (LOPRESSOR) injection 2-5 mg, 2-5 mg, Intravenous, Q2H PRN, Nadara Mustard, MD   nutrition supplement (JUVEN) (JUVEN) powder packet  1 packet, 1 packet, Oral, BID BM, Nadara Mustard, MD, 1 packet at 07/29/22 0822   ondansetron [facility]) injection 4 mg, 4 mg, Intravenous, Q6H PRN, Nadara Mustard, MD   Oral care mouth rinse, 15 mL, Mouth Rinse, PRN, Nadara Mustard, MD   Oral care mouth rinse, 15 mL, Mouth Rinse, PRN, Hanley Ben, Kshitiz, MD   oxyCODONE (Oxy IR/ROXICODONE) immediate release tablet 10-15 mg, 10-15 mg, Oral, Q4H PRN, Nadara Mustard, MD, 10 mg at 07/27/22 2215   oxyCODONE (Oxy IR/ROXICODONE) immediate release tablet 5-10 mg, 5-10 mg, Oral, Q4H PRN, Nadara Mustard, MD, 5 mg at 07/28/22 2217   pantoprazole (PROTONIX) EC tablet 40 mg, 40 mg, Oral, QHS, Nadara Mustard, MD, 40 mg at 07/28/22 2120   phenol (CHLORASEPTIC) mouth spray 1 spray, 1 spray, Mouth/Throat, PRN, Nadara Mustard, MD   polyethylene glycol (MIRALAX / GLYCOLAX) packet 17 g, 17 g, Oral, Daily, Nadara Mustard, MD, 17 g at 07/29/22 1610   polyethylene glycol (MIRALAX / GLYCOLAX) packet 17 g, 17 g, Oral, Daily PRN, Nadara Mustard, MD   potassium chloride SA (KLOR-CON M) CR tablet 20-40 mEq, 20-40 mEq, Oral, Daily PRN, Nadara Mustard, MD   rosuvastatin (CRESTOR) tablet 10 mg, 10 mg, Oral, Daily, Nadara Mustard, MD, 10 mg at 07/29/22 9604   traZODone (DESYREL) tablet 50 mg, 50 mg, Oral, QHS PRN, Nadara Mustard, MD, 50 mg at 07/28/22 2120   zinc sulfate capsule 220 mg, 220 mg, Oral, Daily, Nadara Mustard, MD, 220 mg at 07/29/22 5409   Patients Current Diet:  Diet Order                  Diet - low sodium heart healthy             Diet regular Room service appropriate? Yes; Fluid consistency: Thin  Diet effective now                          Precautions / Restrictions Precautions Precautions: Fall, Other (comment) Precaution Comments: R LE wound vac Restrictions Weight Bearing Restrictions: Yes RLE Weight Bearing: Non weight bearing    Has the patient had 2 or more falls or a fall with injury in the past year? Yes   Prior Activity Level Community (5-7x/wk): gets out of house daily   Prior Functional Level Self Care: Did the patient need help bathing, dressing, using the toilet or eating? Independent   Indoor Mobility: Did the patient need assistance with walking from room to room (with or without device)? Independent   Stairs: Did the patient need assistance with internal or external stairs (with or without device)? Independent (wife reported she provides supervision)   Functional Cognition: Did the patient need help planning regular tasks such as shopping or remembering to take medications? Independent   Patient Information Are you of Hispanic, Latino/a,or Spanish origin?: A. No, not of Hispanic, Latino/a, or Spanish origin What is your race?: A. White Do you need or want an interpreter to communicate with a doctor or health care staff?: 0. No   Patient's Response To:  Health Literacy and Transportation Is the patient able to respond to health literacy and transportation needs?: Yes Health Literacy - How often do you need to have someone help you when you read instructions, pamphlets, or other written material from your doctor or pharmacy?: Never In the past 12 months, has lack of transportation kept you from medical  appointments or from getting medications?: No In the past 12 months, has lack of transportation kept you from meetings, work, or from getting things needed for daily living?: No   Journalist, newspaper / Equipment Home Assistive Devices/Equipment: CBG Meter, Eyeglasses, Prosthesis, Scales, Wheelchair Home Equipment: Rollator (4 wheels), Shower seat, Cane - single point, Agricultural consultant (2 wheels),  Wheelchair - manual, BSC/3in1   Prior Device Use: Indicate devices/aids used by the patient prior to current illness, exacerbation or injury? Manual wheelchair, Walker, and Orthotics/Prosthetics   Current Functional Level Cognition   Overall Cognitive Status: No family/caregiver present to determine baseline cognitive functioning Current Attention Level: Sustained Orientation Level: Oriented X4 Following Commands: Follows one step commands with increased time Safety/Judgement: Decreased awareness of safety General Comments: easily distracted and does benefit from cues for problem solving and staying on task. appears somewhat confused.    Extremity Assessment (includes Sensation/Coordination)   Upper Extremity Assessment: Overall WFL for tasks assessed  Lower Extremity Assessment: Defer to PT evaluation RLE Deficits / Details: In shriker sock, wound vac on, very minimal drainage in canister. Guarding and weakness as expected post-op. LLE Deficits / Details: Hx prior BKA. Has prosthesis and able to WB through LLE using RW.     ADLs   Overall ADL's : Needs assistance/impaired Eating/Feeding: Set up, Sitting Grooming: Set up, Sitting Upper Body Bathing: Set up, Sitting Lower Body Bathing: Moderate assistance, Sitting/lateral leans, Sit to/from stand Upper Body Dressing : Set up, Sitting Lower Body Dressing: Maximal assistance, Sitting/lateral leans, Sit to/from stand Lower Body Dressing Details (indicate cue type and reason): Min A for prosthetic mgmt with pt difficulty lining up correctly Toileting- Clothing Manipulation and Hygiene: Maximal assistance, Sitting/lateral lean, Sit to/from stand General ADL Comments: Focus on transfer back to bed per nursing request for assistance.     Mobility   Overal bed mobility: Needs Assistance Bed Mobility: Sit to Supine Supine to sit: Min guard Sit to supine: Supervision General bed mobility comments: for safety and attention to wound vac line      Transfers   Overall transfer level: Needs assistance Equipment used: Rolling walker (2 wheels), None Transfers: Bed to chair/wheelchair/BSC Sit to Stand: Mod assist, From elevated surface Bed to/from chair/wheelchair/BSC transfer type:: Lateral/scoot transfer Stand pivot transfers: Mod assist, From elevated surface  Lateral/Scoot Transfers: Min guard General transfer comment: Positioned pt in appropriate spot for scoot transfer back to bed towards L side. min guard though pt w/ some difficulty w/ last scoot to get fully back into bed     Ambulation / Gait / Stairs / Wheelchair Mobility   Ambulation/Gait Pre-gait activities: weight shift from arms to Lt prosthetic, able to clear foot and briefly maintain WB through UEs only on walker.     Posture / Balance Dynamic Sitting Balance Sitting balance - Comments: Sat EOB with min guard supervision, was able to donne prosthesis on Lt independently. Balance Overall balance assessment: Needs assistance Sitting-balance support: No upper extremity supported, Feet supported Sitting balance-Leahy Scale: Good Sitting balance - Comments: Sat EOB with min guard supervision, was able to donne prosthesis on Lt independently. Standing balance support: Bilateral upper extremity supported, During functional activity Standing balance-Leahy Scale: Poor     Special needs/care consideration Dialysis: Hemodialysis Tuesday, Thursday, and Saturday, Wound Vac leg/right, Skin Stage 1 Pressure injury: sacrum; Surgical incision: leg/right, and Diabetic management Novolog 0-5 units daily at bedtime; Novolog 0-9  units 3x daily with meals    Previous Home Environment (from acute therapy documentation)  Living Arrangements: Spouse/significant other  Lives With: Spouse Available Help at Discharge: Family, Available 24 hours/day Type of Home: House Home Layout: One level Home Access: Ramped entrance Bathroom Shower/Tub: Health visitor:  Standard Bathroom Accessibility: Yes How Accessible: Accessible via walker Home Care Services: No Additional Comments: wife sets up HD at home   Discharge Living Setting Plans for Discharge Living Setting: Patient's home Type of Home at Discharge: House Discharge Home Layout: One level Discharge Home Access: Ramped entrance Discharge Bathroom Shower/Tub: Walk-in shower Discharge Bathroom Toilet: Standard Discharge Bathroom Accessibility: Yes How Accessible: Accessible via walker Does the patient have any problems obtaining your medications?: No   Social/Family/Support Systems Anticipated Caregiver: Donavan Kerlin, wife Anticipated Caregiver's Contact Information: [phone] Caregiver Availability: 24/7 Discharge Plan Discussed with Primary Caregiver: Yes Is Caregiver In Agreement with Plan?: Yes Does Caregiver/Family have Issues with Lodging/Transportation while Pt is in Rehab?: No   Goals Patient/Family Goal for Rehab: Supervision-Min A:PT/OT Expected length of stay: 12-14 days Pt/Family Agrees to Admission and willing to participate: Yes Program Orientation Provided & Reviewed with Pt/Caregiver Including Roles  & Responsibilities: Yes   Decrease burden of Care through IP rehab admission: NA   Possible need for SNF placement upon discharge: Not ancipiated   Patient Condition: I have reviewed medical records from [facility], spoken with CM, and patient and spouse. I met with patient at the bedside and discussed via phone for inpatient rehabilitation assessment.  Patient will benefit from ongoing PT and OT, can actively participate in 3 hours of therapy a day 5 days of the week, and can make measurable gains during the admission.  Patient will also benefit from the coordinated team approach during an Inpatient Acute Rehabilitation admission.  The patient will receive intensive therapy as well as Rehabilitation physician, nursing, social worker, and care management  interventions.  Due to safety, skin/wound care, disease management, medication administration, pain management, and patient education the patient requires 24 hour a day rehabilitation nursing.  The patient is currently Mod A with mobility and Set up-Max A with basic ADLs.  Discharge setting and therapy post discharge at home with home health is anticipated.  Patient has agreed to participate in the Acute Inpatient Rehabilitation Program and will admit today.   Preadmission Screen Completed By:  Domingo Pulse, 07/29/2022 11:34 AM ______________________________________________________________________   Discussed status with Dr. Wynn Banker on 07/29/22  at 11:34 AM and received approval for admission today.   Admission Coordinator:  Domingo Pulse, CCC-SLP, time 11:34 AM/Date 07/29/22     Assessment/Plan: Diagnosis:RIght BKA 07/26/2022 Does the need for close, 24 hr/day Medical supervision in concert with the patient's rehab needs make it unreasonable for this patient to be served in a less intensive setting? Yes Co-Morbidities requiring supervision/potential complications: ESRD,HTN, post op pain and wound care Due to bladder management, bowel management, safety, skin/wound care, disease management, medication administration, pain management, and patient education, does the patient require 24 hr/day rehab nursing? Yes Does the patient require coordinated care of a physician, rehab nurse, PT, OT, and SLP to address physical and functional deficits in the context of the above medical diagnosis(es)? Yes Addressing deficits in the following areas: balance, endurance, locomotion, strength, transferring, bowel/bladder control, bathing, dressing, feeding, grooming, toileting, and psychosocial support Can the patient actively participate in an intensive therapy program of at least 3 hrs of therapy 5 days a week? Yes The potential for patient to make measurable gains while on inpatient rehab is  good  Anticipated functional outcomes upon discharge from inpatient rehab: supervision and min assist PT, supervision and min assist OT, n/a SLP Estimated rehab length of stay to reach the above functional goals is: 12-14d Anticipated discharge destination: Home 10. Overall Rehab/Functional Prognosis: good     MD Signature: Erick Colace M.D. [facility] Fellow Am Acad of Phys Med and Rehab Diplomate Am Board of Electrodiagnostic Med Fellow Am Board of Interventional Pain

## 2022-07-29 NOTE — TOC Progression Note (Signed)
Transition of Care [facility]) - Progression Note    Patient Details  Name: [name] MRN: [id number] Date of Birth: 1950-11-14  [facility]) CM/SW Contact  Tom-Johnson, Hershal Coria, RN Phone Number: 07/29/2022, 3:28 PM  Clinical Narrative:     CIR holding discharge d/t beds availability, will update tomorrow.        Barriers to Discharge: Barriers Resolved  Expected Discharge Plan and Services         Expected Discharge Date: 07/29/22               DME Arranged: N/A DME Agency: NA       HH Arranged: NA HH Agency: NA         Social Determinants of Health (SDOH) Interventions SDOH Screenings   Food Insecurity: Patient Declined (07/28/2022)  Housing: Low Risk  (07/28/2022)  Transportation Needs: No Transportation Needs (07/28/2022)  Utilities: Not At Risk (07/28/2022)  Depression (PHQ2-9): Low Risk  (03/11/2022)  Tobacco Use: Medium Risk (07/29/2022)    Readmission Risk Interventions    07/29/2022   10:39 AM  Readmission Risk Prevention Plan  Transportation Screening Complete  PCP or Specialist appointment within 3-5 days of discharge Complete  SW Recovery Care/Counseling Consult Complete  Skilled Nursing Facility Not Applicable

## 2022-07-29 NOTE — H&P (Signed)
Physical Medicine and Rehabilitation Admission H&P        Chief Complaint  Patient presents with   Altered Mental Status  : HPI: [name] is a 72 year old right-handed male well-known to rehab services from left BKA receiving CIR 05/11/2021 - 05/24/2021 as well as history significant for hypertension, hyperlipidemia, CAD/CABG 2008, atrial flutter with ablation 04/06/2020, diabetes mellitus, peripheral vascular disease, prostate cancer with TURP, quit smoking 33 years ago, end-stage renal disease with hemodialysis and chronic anemia.  Per chart review patient lives with spouse.  1 level home with ramped entrance.  Patient can ambulate with a rollator prior to admission with prosthesis and needing some assist for ADLs.  Wife with good support.  Presented 07/22/2022 with altered mental status x 2 days low-grade fever and hypoxia as well as chronic right foot wound.  He required BiPAP on presentation with initial improvement but later his mental status declined and deteriorated subsequently required intubation and was extubated 07/23/2022.  COVID-19/influenza/RSV PCR were negative.  Chest x-ray unremarkable, cranial CT scan negative for acute changes, CT chest abdomen and pelvis with no acute abnormality except for mildly nodular hepatic contour (question cirrhosis) small volume ascites.  No findings specific for metastatic disease..  Admission chemistries unremarkable except sodium 132 chloride 89 glucose 125 BUN 48 creatinine 7.55, hemoglobin 7.9, blood cultures no growth to date, lactic acid within normal limits.  Renal service follow-up with hemodialysis ongoing as directed.  Dr. Lajoyce Corners consulted in regards to abscess of right foot with x-ray showing subcutaneous gas within the soft tissues lateral and plantar to the fifth tarsometatarsal joint.  No superimposed osseous erosion identified to suggest osteomyelitis.  Limb was not felt to be salvageable underwent right BKA 07/26/2022 per Dr. Lajoyce Corners and wound VAC was  applied postoperatively.  Patient was cleared for subcutaneous heparin for DVT prophylaxis.  Acute on chronic anemia latest hemoglobin 9.8.  Therapy evaluations completed due to patient decreased functional mobility was admitted for a comprehensive rehab program.   Review of Systems  Constitutional:  Positive for fever and malaise/fatigue.  HENT:  Negative for hearing loss.   Eyes:  Negative for blurred vision and double vision.  Respiratory:  Negative for cough, shortness of breath and wheezing.   Cardiovascular:  Positive for palpitations and leg swelling. Negative for chest pain.  Gastrointestinal:  Positive for constipation. Negative for heartburn, nausea and vomiting.       GERD  Genitourinary:  Negative for dysuria, flank pain and hematuria.  Musculoskeletal:  Positive for joint pain and myalgias.  Neurological:  Positive for weakness.  All other systems reviewed and are negative.       Past Medical History:  Diagnosis Date   Acute osteomyelitis of metatarsal bone of left foot     Ambulates with cane      straight cane   Anemia     Cervical myelopathy 02/06/2018   Chronic kidney disease      dailysis M [facility]- home   Community acquired pneumonia of left lower lobe of lung 08/18/2021   Complication of anesthesia     Coronary artery disease     Dehiscence of amputation stump of left lower extremity     Diabetes mellitus without complication      type2   Diabetic foot ulcer     Diabetic neuropathy 02/06/2018   Dysrhythmia     Gait abnormality 08/24/2018   GERD (gastroesophageal reflux disease)       01/06/20- not current   Gout  History of blood transfusion     History of blood transfusion     History of kidney stones      passed stones   Hypercholesteremia     Hypertension     Hypothyroidism     Neuromuscular disorder      neuropathy left leg and bilateral feet   Neuropathy     Partial nontraumatic amputation of foot, 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   PONV (postoperative nausea  and vomiting)     Prostate cancer     PVD (peripheral vascular disease)      with amputations         Past Surgical History:  Procedure Laterality Date   A-FLUTTER ABLATION N/A 04/06/2020    Procedure: A-FLUTTER ABLATION;  Surgeon: Marinus Maw, MD;  Location: [facility];  Service: Cardiovascular;  Laterality: N/A;   AMPUTATION Left 12/25/2013    Procedure: AMPUTATION RAY LEFT 5TH RAY;  Surgeon: Nadara Mustard, MD;  Location: [facility] ORS;  Service: Orthopedics;  Laterality: Left;   AMPUTATION Right 12/15/2018    Procedure: AMPUTATION OF 4TH AND 5TH TOES RIGHT FOOT;  Surgeon: Nadara Mustard, MD;  Location: [facility] OR;  Service: Orthopedics;  Laterality: Right;   AMPUTATION Left 02/02/2021    Procedure: LEFT FOOT 4TH RAY AMPUTATION;  Surgeon: Nadara Mustard, MD;  Location: [facility] OR;  Service: Orthopedics;  Laterality: Left;   AMPUTATION Left 05/09/2021    Procedure: LEFT BELOW KNEE AMPUTATION;  Surgeon: Nadara Mustard, MD;  Location: [facility] OR;  Service: Orthopedics;  Laterality: Left;   APPLICATION OF WOUND VAC Right 12/15/2018    Procedure: APPLICATION OF WOUND VAC;  Surgeon: Nadara Mustard, MD;  Location: [facility] OR;  Service: Orthopedics;  Laterality: Right;   AV FISTULA PLACEMENT Left 03/25/2019    Procedure: LEFT ARM ARTERIOVENOUS (AV) FISTULA CREATION;  Surgeon: Nada Libman, MD;  Location: [facility] OR;  Service: Vascular;  Laterality: Left;   BACK SURGERY       BASCILIC VEIN TRANSPOSITION Left 05/20/2019    Procedure: SECOND STAGE LEFT BASCILIC VEIN TRANSPOSITION;  Surgeon: Nada Libman, MD;  Location: [facility] OR;  Service: Vascular;  Laterality: Left;   BIOPSY   06/22/2022    Procedure: BIOPSY;  Surgeon: Lemar Lofty., MD;  Location: [facility] ENDOSCOPY;  Service: Gastroenterology;;   CARDIAC CATHETERIZATION   02/17/2014   CHOLECYSTECTOMY       COLONOSCOPY   2011    in [place], Normal   COLONOSCOPY N/A 06/23/2022    Procedure: COLONOSCOPY;  Surgeon: Sherrilyn Rist, MD;  Location: [facility]  ENDOSCOPY;  Service: Gastroenterology;  Laterality: N/A;   CORONARY ARTERY BYPASS GRAFT   2008   CYSTOSCOPY N/A 06/29/2020    Procedure: CYSTOSCOPY WITH CLOT EVACUATION AND  FULGERATION;  Surgeon: Marcine Matar, MD;  Location: [facility] OR;  Service: Urology;  Laterality: N/A;   CYSTOSCOPY WITH FULGERATION Bilateral 06/17/2020    Procedure: CYSTOSCOPY,BILATERAL RETROGRADE, CLOT EVACUATION WITH FULGERATION OF THE BLADDER;  Surgeon: Jannifer Hick, MD;  Location: [facility] OR;  Service: Urology;  Laterality: Bilateral;   CYSTOSCOPY WITH FULGERATION N/A 06/28/2020    Procedure: CYSTOSCOPY WITH CLOT EVACUATION AND FULGERATION OF BLEEDERS;  Surgeon: Marcine Matar, MD;  Location: [facility] OR;  Service: Urology;  Laterality: N/A;  1 HR   ENTEROSCOPY N/A 06/22/2022    Procedure: ENTEROSCOPY;  Surgeon: Meridee Score Netty Starring., MD;  Location: [facility] ENDOSCOPY;  Service: Gastroenterology;  Laterality: N/A;   HEMOSTASIS CLIP PLACEMENT   06/22/2022  Procedure: HEMOSTASIS CLIP PLACEMENT;  Surgeon: Lemar Lofty., MD;  Location: [facility] ENDOSCOPY;  Service: Gastroenterology;;   HOT HEMOSTASIS N/A 06/22/2022    Procedure: HOT HEMOSTASIS (ARGON PLASMA COAGULATION/BICAP);  Surgeon: Lemar Lofty., MD;  Location: [facility] ENDOSCOPY;  Service: Gastroenterology;  Laterality: N/A;   I & D EXTREMITY Right 12/15/2018    Procedure: DEBRIDEMENT RIGHT FOOT;  Surgeon: Nadara Mustard, MD;  Location: [facility] OR;  Service: Orthopedics;  Laterality: Right;   I & D EXTREMITY Right 08/27/2019    Procedure: PARTIAL CUBOID EXCISION RIGHT FOOT;  Surgeon: Nadara Mustard, MD;  Location: [facility] OR;  Service: Orthopedics;  Laterality: Right;   I & D EXTREMITY Right 01/07/2020    Procedure: RIGHT FOOT EXCISION INFECTED BONE;  Surgeon: Nadara Mustard, MD;  Location: [facility] OR;  Service: Orthopedics;  Laterality: Right;   I & D EXTREMITY Right 05/17/2022    Procedure: IRRIGATION AND DEBRIDEMENT RIGHT FOOT ABSCESS;  Surgeon: Nadara Mustard, MD;  Location: [facility] OR;   Service: Orthopedics;  Laterality: Right;   NECK SURGERY        novemver 2019   POLYPECTOMY   06/22/2022    Procedure: POLYPECTOMY;  Surgeon: Mansouraty, Netty Starring., MD;  Location: [facility] ENDOSCOPY;  Service: Gastroenterology;;   POLYPECTOMY   06/23/2022    Procedure: POLYPECTOMY;  Surgeon: Sherrilyn Rist, MD;  Location: [facility] ENDOSCOPY;  Service: Gastroenterology;;   STUMP REVISION Left 06/27/2021    Procedure: REVISION LEFT BELOW KNEE AMPUTATION;  Surgeon: Nadara Mustard, MD;  Location: [facility] OR;  Service: Orthopedics;  Laterality: Left;   SUBMUCOSAL LIFTING INJECTION   06/22/2022    Procedure: SUBMUCOSAL LIFTING INJECTION;  Surgeon: Meridee Score Netty Starring., MD;  Location: [facility] ENDOSCOPY;  Service: Gastroenterology;;   SUBMUCOSAL TATTOO INJECTION   06/22/2022    Procedure: SUBMUCOSAL TATTOO INJECTION;  Surgeon: Lemar Lofty., MD;  Location: [facility] ENDOSCOPY;  Service: Gastroenterology;;   TRANSURETHRAL RESECTION OF PROSTATE N/A 06/28/2020    Procedure: TRANSURETHRAL RESECTION OF THE PROSTATE (TURP);  Surgeon: Marcine Matar, MD;  Location: [facility] OR;  Service: Urology;  Laterality: N/A;   WISDOM TOOTH EXTRACTION             Family History  Problem Relation Age of Onset   Diabetes Mellitus II Mother     Kidney disease Mother     Diabetes Mellitus II Father     CAD Father     Cancer Father          prostate   Kidney disease Father     Diabetes Mellitus II Brother     Kidney disease Brother     Diabetes Mellitus II Brother     Stomach cancer Brother 66   Kidney disease Brother     Colon cancer Neg Hx     Colon polyps Neg Hx     Esophageal cancer Neg Hx     Rectal cancer Neg Hx     Pancreatic cancer Neg Hx      Social History:  reports that he quit smoking about 33 years ago. His smoking use included cigars. He started smoking about 49 years ago. He has never used smokeless tobacco. He reports that he does not drink alcohol and does not use drugs. Allergies:       Allergies  Allergen  Reactions   Morphine        Other reaction(s): Delusions (intolerance)   Mushroom Extract Complex Nausea Only  Medications Prior to Admission  Medication Sig Dispense Refill   acetaminophen (TYLENOL) 500 MG tablet Take 1,000 mg by mouth every 8 (eight) hours as needed for mild pain or headache.       allopurinol (ZYLOPRIM) 100 MG tablet Take 2 tablets (200 mg total) by mouth daily. 30 tablet 0   carvedilol (COREG) 12.5 MG tablet TAKE 1 TABLET BY MOUTH IN THE MORNING AND AT BEDTIME. (Patient taking differently: Take 12.5 mg by mouth 2 (two) times daily with a meal.) 180 tablet 3   Darbepoetin Alfa (ARANESP) 100 MCG/0.5ML SOSY injection Inject 0.5 mLs (100 mcg total) into the vein every Thursday with hemodialysis. 4.2 mL     Insulin Pen Needle 32G X 4 MM MISC Use to inject insulin up to 4 times daily as needed. 100 each 0   levothyroxine (SYNTHROID) 112 MCG tablet Take 1 tablet (112 mcg total) by mouth daily before breakfast. 30 tablet 0   losartan (COZAAR) 50 MG tablet Take 50 mg by mouth 2 (two) times daily.       Methoxy PEG-Epoetin Beta (MIRCERA IJ) Inject into the skin.       modafinil (PROVIGIL) 200 MG tablet Take 1 tablet (200 mg total) by mouth daily. 90 tablet 3   OZEMPIC, 0.25 OR 0.5 MG/DOSE, 2 MG/3ML SOPN Inject 0.5 mg into the skin once a week.       pantoprazole (PROTONIX) 40 MG tablet Take 1 tablet (40 mg total) by mouth 2 (two) times daily before a meal. 60 tablet 0   pregabalin (LYRICA) 150 MG capsule Take 1 capsule by mouth in the morning and 2 capsules at night. (Patient taking differently: Take 150 mg by mouth 3 (three) times daily.) 90 capsule 0   prochlorperazine (COMPAZINE) 5 MG tablet TAKE 1 TABLET BY MOUTH TWICE A DAY 60 tablet 3   rosuvastatin (CRESTOR) 10 MG tablet Take 1 tablet (10 mg total) by mouth daily. 30 tablet 0   sevelamer carbonate (RENVELA) 800 MG tablet Take 1 tablet (800 mg total) by mouth 3 (three) times daily with meals. (Patient taking  differently: Take 800 mg by mouth daily.) 90 tablet 0   Vitamin D, Ergocalciferol, (DRISDOL) 1.25 MG (50000 UNIT) CAPS capsule Take 1 capsule (50,000 Units total) by mouth every 7 (seven) days. 20 capsule 3          Home: Home Living Family/patient expects to be discharged to:: Private residence Living Arrangements: Spouse/significant other Available Help at Discharge: Family, Other (Comment) Type of Home: House Home Access: Ramped entrance Home Layout: Able to live on main level with bedroom/bathroom Bathroom Shower/Tub: Health visitor: Standard Bathroom Accessibility: No Home Equipment: Rollator (4 wheels), Shower seat, Cane - single point, Agricultural consultant (2 wheels), Wheelchair - manual, BSC/3in1 Additional Comments: wife sets up HD at home   Functional History: Prior Function Prior Level of Function : Needs assist Mobility Comments: Pt could walk with rollator, was recently on restrictions and not allowed to walk due to RLE wound. ADLs Comments: Wife would assist with set-up for bath but did not physical help. Toilets himself. Ind with dressing.   Functional Status:  Mobility: Bed Mobility Overal bed mobility: Needs Assistance Bed Mobility: Sit to Supine Supine to sit: Min guard Sit to supine: Supervision General bed mobility comments: for safety and attention to wound vac line Transfers Overall transfer level: Needs assistance Equipment used: Rolling walker (2 wheels), None Transfers: Bed to chair/wheelchair/BSC Sit to Stand: Mod assist, From elevated surface  Bed to/from chair/wheelchair/BSC transfer type:: Lateral/scoot transfer Stand pivot transfers: Mod assist, From elevated surface  Lateral/Scoot Transfers: Min guard General transfer comment: Positioned pt in appropriate spot for scoot transfer back to bed towards L side. min guard though pt w/ some difficulty w/ last scoot to get fully back into bed Ambulation/Gait Pre-gait activities: weight shift  from arms to Lt prosthetic, able to clear foot and briefly maintain WB through UEs only on walker.   ADL: ADL Overall ADL's : Needs assistance/impaired Eating/Feeding: Set up, Sitting Grooming: Set up, Sitting Upper Body Bathing: Set up, Sitting Lower Body Bathing: Moderate assistance, Sitting/lateral leans, Sit to/from stand Upper Body Dressing : Set up, Sitting Lower Body Dressing: Maximal assistance, Sitting/lateral leans, Sit to/from stand Lower Body Dressing Details (indicate cue type and reason): Min A for prosthetic mgmt with pt difficulty lining up correctly Toileting- Clothing Manipulation and Hygiene: Maximal assistance, Sitting/lateral lean, Sit to/from stand General ADL Comments: Focus on transfer back to bed per nursing request for assistance.   Cognition: Cognition Overall Cognitive Status: No family/caregiver present to determine baseline cognitive functioning Orientation Level: Oriented X4 Cognition Arousal/Alertness: Awake/alert Behavior During Therapy: WFL for tasks assessed/performed, Restless Overall Cognitive Status: No family/caregiver present to determine baseline cognitive functioning Area of Impairment: Attention, Following commands, Safety/judgement, Awareness, Problem solving Current Attention Level: Sustained Following Commands: Follows one step commands with increased time Safety/Judgement: Decreased awareness of safety Awareness: Emergent Problem Solving: Slow processing, Requires verbal cues General Comments: easily distracted and does benefit from cues for problem solving and staying on task. appears somewhat confused.   Physical Exam: Blood pressure (!) 162/71, pulse 75, temperature 98.3 F (36.8 C), temperature source Oral, resp. rate 18, height 5\' 10"  (1.778 m), weight 87.8 kg, SpO2 95 %. Physical Exam Skin:    Comments: Wound VAC in place to right BKA.  Neurological:     Comments: Patient is alert and makes eye contact with examiner.  Provides  his name and age.  He was limited medical historian.  Complains of right lower extremity pain.     General: No acute distress Mood and affect are appropriate Heart: Regular rate and rhythm no rubs murmurs or extra sounds Lungs: Clear to auscultation, breathing unlabored, no rales or wheezes Abdomen: Positive bowel sounds, soft nontender to palpation, nondistended Extremities: No clubbing, cyanosis, or edema Skin: No evidence of breakdown, no evidence of rash Neurologic: Cranial nerves II through XII intact, motor strength is 4/5 in bilateral deltoid, bicep, tricep, grip, hip flexor, L knee extensors, RIght knee ext not tested due to Wound vac, bilateral distal testing deferred due to bilateral BKA Sensory exam normal sensation to light touch except for left little finger sensation in bilateral upper  Musculoskeletal: Full range of motion bilateral upper extremities bilateral hand intrinsic minus deformities. Right BKA with wound VAC limiting examination, left knee range of motion appears to be within functional limits wearing left BKA prosthetic   Lab Results Last 48 Hours        Results for orders placed or performed during the hospital encounter of 07/22/22 (from the past 48 hour(s))  Glucose, capillary     Status: Abnormal    Collection Time: 07/27/22  7:20 AM  Result Value Ref Range    Glucose-Capillary 155 (H) 70 - 99 mg/dL      Comment: Glucose reference range applies only to samples taken after fasting for at least 8 hours.  Renal function panel     Status: Abnormal    Collection  Time: 07/27/22  8:01 AM  Result Value Ref Range    Sodium 132 (L) 135 - 145 mmol/L    Potassium 3.8 3.5 - 5.1 mmol/L    Chloride 94 (L) 98 - 111 mmol/L    CO2 24 22 - 32 mmol/L    Glucose, Bld 160 (H) 70 - 99 mg/dL      Comment: Glucose reference range applies only to samples taken after fasting for at least 8 hours.    BUN 49 (H) 8 - 23 mg/dL    Creatinine, Ser 6.57 (H) 0.61 - 1.24 mg/dL    Calcium  7.9 (L) 8.9 - 10.3 mg/dL    Phosphorus 4.2 2.5 - 4.6 mg/dL    Albumin 2.9 (L) 3.5 - 5.0 g/dL    GFR, Estimated 7 (L) >60 mL/min      Comment: (NOTE) Calculated using the CKD-EPI Creatinine Equation (2021)      Anion gap 14 5 - 15      Comment: Performed at [facility], [address], [place], [place] [address]  CBC     Status: Abnormal    Collection Time: 07/27/22  8:01 AM  Result Value Ref Range    WBC 7.9 4.0 - 10.5 K/uL    RBC 3.29 (L) 4.22 - 5.81 MIL/uL    Hemoglobin 9.8 (L) 13.0 - 17.0 g/dL    HCT 29.5 (L) 28.4 - 52.0 %    MCV 89.1 80.0 - 100.0 fL    MCH 29.8 26.0 - 34.0 pg    MCHC 33.4 30.0 - 36.0 g/dL    RDW 13.2 44.0 - 10.2 %    Platelets 126 (L) 150 - 400 K/uL    nRBC 0.0 0.0 - 0.2 %      Comment: Performed at [facility], [address] [place], [place] [address]  Glucose, capillary     Status: Abnormal    Collection Time: 07/27/22  1:25 PM  Result Value Ref Range    Glucose-Capillary 109 (H) 70 - 99 mg/dL      Comment: Glucose reference range applies only to samples taken after fasting for at least 8 hours.  Glucose, capillary     Status: Abnormal    Collection Time: 07/27/22  4:19 PM  Result Value Ref Range    Glucose-Capillary 128 (H) 70 - 99 mg/dL      Comment: Glucose reference range applies only to samples taken after fasting for at least 8 hours.  Glucose, capillary     Status: Abnormal    Collection Time: 07/27/22  9:20 PM  Result Value Ref Range    Glucose-Capillary 158 (H) 70 - 99 mg/dL      Comment: Glucose reference range applies only to samples taken after fasting for at least 8 hours.  Glucose, capillary     Status: Abnormal    Collection Time: 07/28/22  7:22 AM  Result Value Ref Range    Glucose-Capillary 129 (H) 70 - 99 mg/dL      Comment: Glucose reference range applies only to samples taken after fasting for at least 8 hours.  Glucose, capillary     Status: Abnormal    Collection Time: 07/28/22 11:06 AM  Result Value Ref Range     Glucose-Capillary 164 (H) 70 - 99 mg/dL      Comment: Glucose reference range applies only to samples taken after fasting for at least 8 hours.  Glucose, capillary     Status: Abnormal  Collection Time: 07/28/22  4:40 PM  Result Value Ref Range    Glucose-Capillary 158 (H) 70 - 99 mg/dL      Comment: Glucose reference range applies only to samples taken after fasting for at least 8 hours.  Glucose, capillary     Status: Abnormal    Collection Time: 07/28/22  8:34 PM  Result Value Ref Range    Glucose-Capillary 125 (H) 70 - 99 mg/dL      Comment: Glucose reference range applies only to samples taken after fasting for at least 8 hours.      Imaging Results (Last 48 hours)  No results found.         Blood pressure (!) 162/71, pulse 75, temperature 98.3 F (36.8 C), temperature source Oral, resp. rate 18, height 5\' 10"  (1.778 m), weight 87.8 kg, SpO2 95 %.   Medical Problem List and Plan: 1. Functional deficits secondary to right BKA secondary to abscess 07/26/2022 with wound VAC per Dr. Lajoyce Corners as well as history of left BKA 05/09/2021.  Patient does have a prosthesis             -patient may  shower with ext wrapped once wound vac is removed              -ELOS/Goals: 12-14d, Sup/minA level goals  2.  Antithrombotics: -DVT/anticoagulation:  Pharmaceutical: Heparin             -antiplatelet therapy: N/A 3. Pain Management: Oxycodone as needed 4. Mood/Behavior/Sleep: Trazodone nightly as needed             -antipsychotic agents: N/A 5. Neuropsych/cognition: This patient is capable of making decisions on his own behalf. 6. Skin/Wound Care: Routine skin checks and left BKA wound VAC may be removed 08/02/2022 7. Fluids/Electrolytes/Nutrition: Routine in and outs with follow-up chemistries 8.  End-stage renal disease.  Continue hemodialysis as directed 9.  Diabetes mellitus.  Latest hemoglobin A1c  5.0.  SSI 10.  Acute on chronic anemia.  Continue Aranesp 11.  Hypothyroidism.   Synthroid 12.  Hyperlipidemia.  Crestor 13.  CAD/CABG 2008 as well as history of atrial flutter with ablation 04/06/2020.  Follow-up outpatient with Dr. Ladona Ridgel 14.  History of prostate cancer TURP.  Follow-up outpatient 15.  Hypertension.  Monitor with increased mobility.  Patient had been on Coreg 12.5 mg twice daily, Cozaar 50 mg twice daily prior to admission on hold due to blood pressure being soft.  Resume as needed Charlton Amor, PA-C 07/29/2022        "I have personally performed a face to face diagnostic evaluation of this patient.  Additionally, I have reviewed and concur with the physician assistant's documentation above." Erick Colace M.D. [facility] Fellow Am [employer] of [employer] Med and Rehab Diplomate Am Board of Electrodiagnostic Med Fellow Am Board of Interventional Pain

## 2022-07-30 DIAGNOSIS — Z89511 Acquired absence of right leg below knee: Secondary | ICD-10-CM | POA: Diagnosis not present

## 2022-07-30 LAB — CBC
HCT: 29.7 % — ABNORMAL LOW (ref 39.0–52.0)
Hemoglobin: 9.3 g/dL — ABNORMAL LOW (ref 13.0–17.0)
MCH: 29.2 pg (ref 26.0–34.0)
MCHC: 31.3 g/dL (ref 30.0–36.0)
MCV: 93.1 fL (ref 80.0–100.0)
Platelets: 167 10*3/uL (ref 150–400)
RBC: 3.19 MIL/uL — ABNORMAL LOW (ref 4.22–5.81)
RDW: 16.3 % — ABNORMAL HIGH (ref 11.5–15.5)
WBC: 6.6 10*3/uL (ref 4.0–10.5)
nRBC: 0 % (ref 0.0–0.2)

## 2022-07-30 LAB — RENAL FUNCTION PANEL
Albumin: 2.9 g/dL — ABNORMAL LOW (ref 3.5–5.0)
Anion gap: 16 — ABNORMAL HIGH (ref 5–15)
BUN: 62 mg/dL — ABNORMAL HIGH (ref 8–23)
CO2: 24 mmol/L (ref 22–32)
Calcium: 8.4 mg/dL — ABNORMAL LOW (ref 8.9–10.3)
Chloride: 94 mmol/L — ABNORMAL LOW (ref 98–111)
Creatinine, Ser: 9.13 mg/dL — ABNORMAL HIGH (ref 0.61–1.24)
GFR, Estimated: 6 mL/min — ABNORMAL LOW (ref >60)
Glucose, Bld: 157 mg/dL — ABNORMAL HIGH (ref 70–99)
Phosphorus: 3.9 mg/dL (ref 2.5–4.6)
Potassium: 3.9 mmol/L (ref 3.5–5.1)
Sodium: 134 mmol/L — ABNORMAL LOW (ref 135–145)

## 2022-07-30 LAB — GLUCOSE, CAPILLARY
Glucose-Capillary: 121 mg/dL — ABNORMAL HIGH (ref 70–99)
Glucose-Capillary: 135 mg/dL — ABNORMAL HIGH (ref 70–99)
Glucose-Capillary: 103 mg/dL — ABNORMAL HIGH (ref 70–99)

## 2022-07-30 LAB — HEPATITIS B SURFACE ANTIGEN: Hepatitis B Surface Ag: NONREACTIVE

## 2022-07-30 LAB — SURGICAL PATHOLOGY

## 2022-07-30 MED ORDER — DIPHENHYDRAMINE HCL 50 MG/ML IJ SOLN
12.5000 mg | Freq: Once | INTRAMUSCULAR | Status: AC
  Administered 2022-07-30: 12.5 mg via INTRAVENOUS

## 2022-07-30 MED ORDER — MODAFINIL 100 MG PO TABS
100.0000 mg | ORAL_TABLET | Freq: Every day | ORAL | Status: DC
  Administered 2022-07-30 – 2022-07-31 (×2): 100 mg via ORAL
  Filled 2022-07-30 (×2): qty 1

## 2022-07-30 MED ORDER — CAMPHOR-MENTHOL 0.5-0.5 % EX LOTN
TOPICAL_LOTION | CUTANEOUS | Status: DC | PRN
  Filled 2022-07-30: qty 222

## 2022-07-30 MED ORDER — DIPHENHYDRAMINE HCL 50 MG/ML IJ SOLN
INTRAMUSCULAR | Status: AC
  Filled 2022-07-30: qty 1

## 2022-07-30 MED ORDER — CHLORHEXIDINE GLUCONATE CLOTH 2 % EX PADS: 6.0000 | MEDICATED_PAD | Freq: Every day | CUTANEOUS | Status: DC

## 2022-07-30 NOTE — Progress Notes (Signed)
Inpatient Rehabilitation Center Individual Statement of Services  Patient Name:  [name]  Date:  07/30/2022  Welcome to the Inpatient Rehabilitation Center.  Our goal is to provide you with an individualized program based on your diagnosis and situation, designed to meet your specific needs.  With this comprehensive rehabilitation program, you will be expected to participate in at least 3 hours of rehabilitation therapies Monday-Friday, with modified therapy programming on the weekends.  Your rehabilitation program will include the following services:  Physical Therapy (PT), Occupational Therapy (OT), Speech Therapy (ST), 24 hour per day rehabilitation nursing, Therapeutic Recreaction (TR), Neuropsychology, Care Coordinator, Rehabilitation Medicine, Nutrition Services, Pharmacy Services, and Other  Weekly team conferences will be held on Wednesdays to discuss your progress.  Your Inpatient Rehabilitation Care Coordinator will talk with you frequently to get your input and to update you on team discussions.  Team conferences with you and your family in attendance may also be held.  Expected length of stay: 12-14 Days  Overall anticipated outcome: Supervision-Min A   Depending on your progress and recovery, your program may change. Your Inpatient Rehabilitation Care Coordinator will coordinate services and will keep you informed of any changes. Your Inpatient Rehabilitation Care Coordinator's name and contact numbers are listed  below.  The following services may also be recommended but are not provided by the Inpatient Rehabilitation Center:   Home Health Rehabiltiation Services Outpatient Rehabilitation Services    Arrangements will be made to provide these services after discharge if needed.  Arrangements include referral to agencies that provide these services.  Your insurance has been verified to be:   Medicare A & B Your primary doctor is:  Loann Quill, FNP  Pertinent  information will be shared with your doctor and your insurance company.  Inpatient Rehabilitation Care Coordinator:  Lavera Guise, [place] [phone] or [phone]  Information discussed with and copy given to patient by: Andria Rhein, 07/30/2022, 10:12 AM

## 2022-07-30 NOTE — Progress Notes (Signed)
Inpatient Rehabilitation Admission Medication Review by a Pharmacist  A complete drug regimen review was completed for this patient to identify any potential clinically significant medication issues.  High Risk Drug Classes Is patient taking? Indication by Medication  Antipsychotic No   Anticoagulant Yes Heparin - DVT px  Antibiotic No   Opioid Yes Oxycodone - pain  Antiplatelet No   Hypoglycemics/insulin Yes SSI - DM  Vasoactive Medication Yes Coreg - HTN  Chemotherapy No   Other Yes Vit C/Zinc - wounds  Aranesp - anemia Synthroid - hypothyroidism Protonix - GERD Rosuvastatin - HLD Trazodone - sleep      Type of Medication Issue Identified Description of Issue Recommendation(s)  Drug Interaction(s) (clinically significant)     Duplicate Therapy     Allergy     No Medication Administration End Date     Incorrect Dose     Additional Drug Therapy Needed     Significant med changes from prior encounter (inform family/care partners about these prior to discharge).    Other  Allopurinol, modafinil, ozempic, Modafanil  Resume if needed at discharge    Clinically significant medication issues were identified that warrant physician communication and completion of prescribed/recommended actions by midnight of the next day:  No  Name of provider notified for urgent issues identified:   Provider Method of Notification:     Pharmacist comments:   Time spent performing this drug regimen review (minutes):  20   Ulyses Southward, PharmD, [place], AAHIVP, CPP Infectious Disease Pharmacist 07/29/2022 3:16 PM

## 2022-07-30 NOTE — Progress Notes (Signed)
Inpatient Rehabilitation Care Coordinator Assessment and Plan Patient Details  Name: [name] MRN: [id number] Date of Birth: May 11, 1950  Today's Date: 07/30/2022  Hospital Problems: Principal Problem:   Right below-knee ampMARKIE FRITHt Medical History:  Past Medical History:  Diagnosis Date   Acute osteomyelitis of metatarsal bone of left foot    Ambulates with cane    straight cane   Anemia    Cervical myelopathy 02/06/2018   Chronic kidney disease    dailysis [facility]- home   Community acquired pneumonia of left lower lobe of lung 08/18/2021   Complication of anesthesia    Coronary artery disease    Dehiscence of amputation stump of left lower extremity    Diabetes mellitus without complication    type2   Diabetic foot ulcer    Diabetic neuropathy 02/06/2018   Dysrhythmia    Gait abnormality 08/24/2018   GERD (gastroesophageal reflux disease)     01/06/20- not current   Gout    History of blood transfusion    History of blood transfusion    History of kidney stones    passed stones   Hypercholesteremia    Hypertension    Hypothyroidism    Neuromuscular disorder    neuropathy left leg and bilateral feet   Neuropathy    Partial nontraumatic amputation of foot, 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   PONV (postoperative nausea and vomiting)    Prostate cancer    PVD (peripheral vascular disease)    with amputations   Past Surgical History:  Past Surgical History:  Procedure Laterality Date   A-FLUTTER ABLATION N/A 04/06/2020   Procedure: A-FLUTTER ABLATION;  Surgeon: Marinus Maw, MD;  Location: [facility] INVASIVE CV LAB;  Service: Cardiovascular;  Laterality: N/A;   AMPUTATION Left 12/25/2013   Procedure: AMPUTATION RAY LEFT 5TH RAY;  Surgeon: Nadara Mustard, MD;  Location: [facility] ORS;  Service: Orthopedics;  Laterality: Left;   AMPUTATION Right 12/15/2018   Procedure: AMPUTATION OF 4TH AND 5TH TOES RIGHT FOOT;  Surgeon: Nadara Mustard, MD;  Location: [facility] OR;  Service: Orthopedics;  Laterality:  Right;   AMPUTATION Left 02/02/2021   Procedure: LEFT FOOT 4TH RAY AMPUTATION;  Surgeon: Nadara Mustard, MD;  Location: [facility] OR;  Service: Orthopedics;  Laterality: Left;   AMPUTATION Left 05/09/2021   Procedure: LEFT BELOW KNEE AMPUTATION;  Surgeon: Nadara Mustard, MD;  Location: [facility] OR;  Service: Orthopedics;  Laterality: Left;   AMPUTATION Right 07/26/2022   Procedure: RIGHT BELOW KNEE AMPUTATION;  Surgeon: Nadara Mustard, MD;  Location: [facility] OR;  Service: Orthopedics;  Laterality: Right;   APPLICATION OF WOUND VAC Right 12/15/2018   Procedure: APPLICATION OF WOUND VAC;  Surgeon: Nadara Mustard, MD;  Location: [facility] OR;  Service: Orthopedics;  Laterality: Right;   AV FISTULA PLACEMENT Left 03/25/2019   Procedure: LEFT ARM ARTERIOVENOUS (AV) FISTULA CREATION;  Surgeon: Nada Libman, MD;  Location: [facility] OR;  Service: Vascular;  Laterality: Left;   BACK SURGERY     BASCILIC VEIN TRANSPOSITION Left 05/20/2019   Procedure: SECOND STAGE LEFT BASCILIC VEIN TRANSPOSITION;  Surgeon: Nada Libman, MD;  Location: [facility] OR;  Service: Vascular;  Laterality: Left;   BIOPSY  06/22/2022   Procedure: BIOPSY;  Surgeon: Lemar Lofty., MD;  Location: [facility] ENDOSCOPY;  Service: Gastroenterology;;   CARDIAC CATHETERIZATION  02/17/2014   CHOLECYSTECTOMY     COLONOSCOPY  2011   in [place], Normal   COLONOSCOPY N/A 06/23/2022   Procedure: COLONOSCOPY;  Surgeon: Sherrilyn Rist, MD;  Location: [facility] ENDOSCOPY;  Service: Gastroenterology;  Laterality: N/A;   CORONARY ARTERY BYPASS GRAFT  2008   CYSTOSCOPY N/A 06/29/2020   Procedure: CYSTOSCOPY WITH CLOT EVACUATION AND  FULGERATION;  Surgeon: Marcine Matar, MD;  Location: [name] OR;  Service: Urology;  Laterality: N/A;   CYSTOSCOPY WITH FULGERATION Bilateral 06/17/2020   Procedure: CYSTOSCOPY,BILATERAL RETROGRADE, CLOT EVACUATION WITH FULGERATION OF THE BLADDER;  Surgeon: Jannifer Hick, MD;  Location: [facility] OR;  Service: Urology;  Laterality: Bilateral;   CYSTOSCOPY  WITH FULGERATION N/A 06/28/2020   Procedure: CYSTOSCOPY WITH CLOT EVACUATION AND FULGERATION OF BLEEDERS;  Surgeon: Marcine Matar, MD;  Location: [facility] OR;  Service: Urology;  Laterality: N/A;  1 HR   ENTEROSCOPY N/A 06/22/2022   Procedure: ENTEROSCOPY;  Surgeon: Meridee Score Netty Starring., MD;  Location: [facility] ENDOSCOPY;  Service: Gastroenterology;  Laterality: N/A;   HEMOSTASIS CLIP PLACEMENT  06/22/2022   Procedure: HEMOSTASIS CLIP PLACEMENT;  Surgeon: Lemar Lofty., MD;  Location: [facility] ENDOSCOPY;  Service: Gastroenterology;;   HOT HEMOSTASIS N/A 06/22/2022   Procedure: HOT HEMOSTASIS (ARGON PLASMA COAGULATION/BICAP);  Surgeon: Lemar Lofty., MD;  Location: [facility] ENDOSCOPY;  Service: Gastroenterology;  Laterality: N/A;   I & D EXTREMITY Right 12/15/2018   Procedure: DEBRIDEMENT RIGHT FOOT;  Surgeon: Nadara Mustard, MD;  Location: [facility] OR;  Service: Orthopedics;  Laterality: Right;   I & D EXTREMITY Right 08/27/2019   Procedure: PARTIAL CUBOID EXCISION RIGHT FOOT;  Surgeon: Nadara Mustard, MD;  Location: [facility] OR;  Service: Orthopedics;  Laterality: Right;   I & D EXTREMITY Right 01/07/2020   Procedure: RIGHT FOOT EXCISION INFECTED BONE;  Surgeon: Nadara Mustard, MD;  Location: [facility] OR;  Service: Orthopedics;  Laterality: Right;   I & D EXTREMITY Right 05/17/2022   Procedure: IRRIGATION AND DEBRIDEMENT RIGHT FOOT ABSCESS;  Surgeon: Nadara Mustard, MD;  Location: [facility] OR;  Service: Orthopedics;  Laterality: Right;   NECK SURGERY     novemver 2019   POLYPECTOMY  06/22/2022   Procedure: POLYPECTOMY;  Surgeon: Mansouraty, Netty Starring., MD;  Location: [facility] ENDOSCOPY;  Service: Gastroenterology;;   POLYPECTOMY  06/23/2022   Procedure: POLYPECTOMY;  Surgeon: Sherrilyn Rist, MD;  Location: [facility] ENDOSCOPY;  Service: Gastroenterology;;   STUMP REVISION Left 06/27/2021   Procedure: REVISION LEFT BELOW KNEE AMPUTATION;  Surgeon: Nadara Mustard, MD;  Location: [facility] OR;  Service: Orthopedics;  Laterality: Left;    SUBMUCOSAL LIFTING INJECTION  06/22/2022   Procedure: SUBMUCOSAL LIFTING INJECTION;  Surgeon: Meridee Score Netty Starring., MD;  Location: [facility] ENDOSCOPY;  Service: Gastroenterology;;   SUBMUCOSAL TATTOO INJECTION  06/22/2022   Procedure: SUBMUCOSAL TATTOO INJECTION;  Surgeon: Lemar Lofty., MD;  Location: [facility] ENDOSCOPY;  Service: Gastroenterology;;   TRANSURETHRAL RESECTION OF PROSTATE N/A 06/28/2020   Procedure: TRANSURETHRAL RESECTION OF THE PROSTATE (TURP);  Surgeon: Marcine Matar, MD;  Location: [facility] OR;  Service: Urology;  Laterality: N/A;   WISDOM TOOTH EXTRACTION     Social History:  reports that he quit smoking about 33 years ago. His smoking use included cigars. He started smoking about 49 years ago. He has never used smokeless tobacco. He reports that he does not drink alcohol and does not use drugs.  Family / Support Systems Marital Status: Married Patient Roles: Spouse Spouse/Significant Other: [name] Children: Son, [name] Other Supports: N/A Anticipated Caregiver: [name], spouse Ability/Limitations of Caregiver: none reported but requesting HC services to assist patient at home (resources provided) Caregiver Availability: 24/7 Family  Dynamics: Support from spouse  Social History Preferred language: English Religion: Jewish Cultural Background: N/A (patient prefers to discuss d/c plan) Education: N/A (patient prefers to discuss d/c plan) Health Literacy - How often do you need to have someone help you when you read instructions, pamphlets, or other written material from your doctor or pharmacy?: Sometimes Writes: Yes Employment Status: Disabled Marine scientist Issues: N/A Guardian/Conservator: Designer, multimedia   Abuse/Neglect Abuse/Neglect Assessment Can Be Completed: Yes Physical Abuse: Denies Verbal Abuse: Denies Sexual Abuse: Denies Exploitation of patient/patient's resources: Denies Self-Neglect: Denies  Patient response to: Social Isolation - How often  do you feel lonely or isolated from those around you?: Never  Emotional Status Pt's affect, behavior and adjustment status: Patient anxious and fustrated about d/c. Focused on d/c and his plan. Spouse encouraging pt to eat. Recent Psychosocial Issues: Coping Psychiatric History: none Substance Abuse History: none  Patient / Family Perceptions, Expectations & Goals Pt/Family understanding of illness & functional limitations: yes, spouse present later within assesment Premorbid pt/family roles/activities: Out daily and independent Anticipated changes in roles/activities/participation: Anticpating discharging home with supervision and assistance from spouse and [facility] Pt/family expectations/goals: [employer]: None Premorbid Home Care/DME Agencies: Other (Comment) (Has: RW, rollator, WC, BSC, Shower seat and SPC) Transportation available at discharge: spouse Is the patient able to respond to transportation needs?: Yes In the past 12 months, has lack of transportation kept you from medical appointments or from getting medications?: No In the past 12 months, has lack of transportation kept you from meetings, work, or from getting things needed for daily living?: No Resource referrals recommended: Neuropsychology  Discharge Planning Living Arrangements: Spouse/significant other Support Systems: Spouse/significant other Type of Residence: Private residence Insurance Resources: Electrical engineer Resources: Restaurant manager, fast food Screen Referred: No Living Expenses: Lives with family Money Management: Spouse, Patient Does the patient have any problems obtaining your medications?: No Home Management: Independent Patient/Family Preliminary Plans: Spouse able to assist if needed Care Coordinator Barriers to Discharge: Decreased caregiver support, Lack of/limited family support Care Coordinator Anticipated Follow Up Needs: HH/OP Expected length of  stay: 12-14 Days  Clinical Impression Sw met with patient ans spouse, introduced self and explained role. Patient anticipates discharging home with spouse with plan to hire [name] for assistance within the home. Patient has a RW, rollator, WC BSC, Shower seat and SPC. [facility] resources provided. No additional questions or concerns.   Andria Rhein 07/30/2022, 12:53 PM

## 2022-07-30 NOTE — Progress Notes (Signed)
PROGRESS NOTE   Subjective/Complaints: No new complaints this morning Does not feel much residual limb pain He would like wound vac removed, discussed removal likely 2 days post amputation  ROS: denies much residual limb pain  Objective:   No results found. Recent Labs    07/29/22 2058  WBC 8.5  HGB 9.7*  HCT 31.4*  PLT 171   Recent Labs    07/29/22 2058  CREATININE 8.42*    Intake/Output Summary (Last 24 hours) at 07/30/2022 1035 Last data filed at 07/30/2022 0900 Gross per 24 hour  Intake 240 ml  Output --  Net 240 ml        Physical Exam: Vital Signs Blood pressure (!) 152/63, pulse 65, temperature 98.2 F (36.8 C), temperature source Oral, resp. rate 18, height  (1.803 m), weight 86.4 kg, SpO2 99 %. Skin:    Comments: Wound VAC in place to right BKA.  Neurological:     Comments: Patient is alert and makes eye contact with examiner.  Provides his name and age.  He was limited medical historian.  Complains of right lower extremity pain. Trouble swallowing pills     General: No acute distress Mood and affect are appropriate Heart: Regular rate and rhythm no rubs murmurs or extra sounds Lungs: Clear to auscultation, breathing unlabored, no rales or wheezes Abdomen: Positive bowel sounds, soft nontender to palpation, nondistended Extremities: No clubbing, cyanosis, or edema Skin: No evidence of breakdown, no evidence of rash Neurologic: Cranial nerves II through XII intact, motor strength is 4/5 in bilateral deltoid, bicep, tricep, grip, hip flexor, L knee extensors, RIght knee ext not tested due to Wound vac, bilateral distal testing deferred due to bilateral BKA Sensory exam normal sensation to light touch except for left little finger sensation in bilateral upper  Musculoskeletal: Full range of motion bilateral upper extremities bilateral hand intrinsic minus deformities. Right BKA with wound VAC  limiting examination, left knee range of motion appears to be within functional limits wearing left BKA prosthetic     Assessment/Plan: 1. Functional deficits which require 3+ hours per day of interdisciplinary therapy in a comprehensive inpatient rehab setting. Physiatrist is providing close team supervision and 24 hour management of active medical problems listed below. Physiatrist and rehab team continue to assess barriers to discharge/monitor patient progress toward functional and medical goals  Care Tool:  Bathing              Bathing assist       Upper Body Dressing/Undressing Upper body dressing        Upper body assist      Lower Body Dressing/Undressing Lower body dressing            Lower body assist       Toileting Toileting    Toileting assist       Transfers Chair/bed transfer  Transfers assist           Locomotion Ambulation   Ambulation assist              Walk 10 feet activity   Assist           Walk 50 feet activity  Assist           Walk 150 feet activity   Assist           Walk 10 feet on uneven surface  activity   Assist           Wheelchair     Assist               Wheelchair 50 feet with 2 turns activity    Assist            Wheelchair 150 feet activity     Assist          Blood pressure (!) 152/63, pulse 65, temperature 98.2 F (36.8 C), temperature source Oral, resp. rate 18, height  (1.803 m), weight 86.4 kg, SpO2 99 %.  Medical Problem List and Plan: 1. Functional deficits secondary to right BKA secondary to abscess 07/26/2022 with wound VAC per Dr. Lajoyce Corners as well as history of left BKA 05/09/2021.  Patient does have a prosthesis             -patient may  shower with ext wrapped once wound vac is removed              -ELOS/Goals: 12-14d, Sup/minA level goals  2.  Antithrombotics: -DVT/anticoagulation:  Pharmaceutical: Heparin              -antiplatelet therapy: N/A 3. Pain Management: Oxycodone as needed 4. Mood/Behavior/Sleep: Trazodone nightly as needed             -antipsychotic agents: N/A 5. Neuropsych/cognition: This patient is capable of making decisions on his own behalf. 6. Skin/Wound Care: Routine skin checks and left BKA wound VAC may be removed 08/02/2022 7. Fluids/Electrolytes/Nutrition: Routine in and outs with follow-up chemistries 8.  End-stage renal disease.  Continue hemodialysis as directed 9.  Diabetes mellitus.  Latest hemoglobin A1c  5.0.  SSI. Provided list of foods for good blood pressure control 10.  Acute on chronic anemia.  Continue Aranesp 11.  Hypothyroidism.  Synthroid 12.  Hyperlipidemia.  Crestor 13.  CAD/CABG 2008 as well as history of atrial flutter with ablation 04/06/2020.  Follow-up outpatient with Dr. Ladona Ridgel 14.  History of prostate cancer TURP.  Follow-up outpatient 15.  Hypertension.  Monitor with increased mobility.  Patient had been on Coreg 12.5 mg twice daily, Cozaar 50 mg twice daily prior to admission on hold due to blood pressure being soft.  Resume as needed 16. Difficulty swallowing pills: SLP consulted 17. Fatigue: restarted daily modafinil    >50 minutes spent in review of chart and vitals, discussion of patient's fatigue with patient and team, restarting daily modafinil , SLP consulted for difficulty swallowing pills, provided list of foods for good blood sugar control  LOS: 1 days A FACE TO FACE EVALUATION WAS PERFORMED  Clint Bolder P Darrow Barreiro 07/30/2022, 10:35 AM

## 2022-07-30 NOTE — Progress Notes (Signed)
Inpatient Rehabilitation  Patient information reviewed and entered into eRehab system by Brylynn Hanssen M. Lorina Duffner, M.A., CCC/SLP, PPS Coordinator.  Information including medical coding, functional ability and quality indicators will be reviewed and updated through discharge.    

## 2022-07-30 NOTE — Progress Notes (Signed)
Subjective:   Moved to rehab-  due for dialysis later today.  No particular complaints  Objective Vital signs in last 24 hours: Vitals:   07/29/22 1856 07/29/22 2010 07/30/22 0430 07/30/22 0817  BP: (!) 152/81   (!) 152/63  Pulse: 67   65  Resp: 18     Temp: 98.2 F (36.8 C)     TempSrc: Oral     SpO2: 100%   99%  Weight:  86.4 kg 86.4 kg   Height:  (1.803 m)      Weight change:   Physical Exam: General: Alert chronically ill-appearing male NAD Heart: RRR no MRG Lungs: CTA bilaterally nonlabored breathing Abdomen: NABS soft NTND Extremities: Right BKA dressing dry clean /left BKA, with prosthesis on Dialysis Access: LUA AV fistula+  bruit  Dialysis Orders: [facility] (Dr. Signe Colt follows)- Sun - Wed - Fri 3:48hr, BFR 425, EDW 88kg, L AVF, no heparin - Venofer  IV weekly.    Problem/Plan:  1. RLE cellulitis: Status post right BKA 4/19 Dr. Lajoyce Corners, had wound VAC, antibiotics complete/CIR admit  2. Acute Hypoxic Resp Failure: Admit intubated / extubated 4/16 /stable now on room air.  UF as tolerated 3. ESRD: [facility] / in hosp TTS schedule while here - Next HD 4/23-  today  via AVF. 4. HTN/volume: BP stable on HD, variable bed weights - 1L UFG. 5. Anemia: Hgb now 9.8 - s/p 2 units PRBCs 4/18. Aranesp given 4/18.. 6. Secondary hyperparathyroidism:  CorrCa/Phos 4.2 -on Renvela as outpatient no current binder /no vitamin D as OP-  will check labs with HD 7. Nutrition: ALB 2.9 /on protein supp  8. T2DM 9. CAD   Annie Sable, MD 07/30/2022    Labs: Basic Metabolic Panel: Recent Labs  Lab 07/25/22 0218 07/26/22 1018 07/27/22 0801 07/29/22 2058  NA 135 135 132*  --   K 3.5 3.7 3.8  --   CL 96* 95* 94*  --   CO2 27  --  24  --   GLUCOSE 182* 105* 160*  --   BUN 50* 32* 49*  --   CREATININE 7.95* 6.50* 7.54* 8.42*  CALCIUM 7.9*  --  7.9*  --   PHOS  --   --  4.2  --    Liver Function Tests: Recent Labs  Lab 07/27/22 0801  ALBUMIN 2.9*   No results  for input(s): "LIPASE", "AMYLASE" in the last 168 hours.  No results for input(s): "AMMONIA" in the last 168 hours. CBC: Recent Labs  Lab 07/25/22 0218 07/25/22 1359 07/26/22 1018 07/27/22 0801 07/29/22 2058  WBC 5.5  --   --  7.9 8.5  NEUTROABS 4.5  --   --   --   --   HGB 7.8*   < > 10.9* 9.8* 9.7*  HCT 23.5*   < > 32.0* 29.3* 31.4*  MCV 90.4  --   --  89.1 93.2  PLT 91*  --   --  126* 171   < > = values in this interval not displayed.   Cardiac Enzymes: No results for input(s): "CKTOTAL", "CKMB", "CKMBINDEX", "TROPONINI" in the last 168 hours. CBG: Recent Labs  Lab 07/29/22 1111 07/29/22 1733 07/29/22 2120 07/30/22 0615 07/30/22 1209  GLUCAP 155* 157* 114* 121* 135*    Studies/Results: No results found. Medications:    vitamin C  1,000 mg Oral Daily   carvedilol  12.5 mg Oral BID WC   [START ON 08/01/2022] darbepoetin (ARANESP) injection - DIALYSIS  100 mcg Subcutaneous Q Thu-1800   feeding supplement (NEPRO CARB STEADY)  237 mL Oral TID BM   heparin  5,000 Units Subcutaneous Q8H   insulin aspart  0-9 Units Subcutaneous TID WC   levothyroxine  112 mcg Oral Q0600   modafinil  100 mg Oral Daily   pantoprazole  40 mg Oral QHS   rosuvastatin  10 mg Oral Daily   zinc sulfate  220 mg Oral Daily

## 2022-07-30 NOTE — Plan of Care (Signed)
  Problem: RH Balance Goal: LTG: Patient will maintain dynamic sitting balance (OT) Description: LTG:  Patient will maintain dynamic sitting balance with assistance during activities of daily living (OT) Flowsheets (Taken 07/30/2022 1311) LTG: Pt will maintain dynamic sitting balance during ADLs with: Independent Goal: LTG Patient will maintain dynamic standing with ADLs (OT) Description: LTG:  Patient will maintain dynamic standing balance with assist during activities of daily living (OT)  Flowsheets (Taken 07/30/2022 1311) LTG: Pt will maintain dynamic standing balance during ADLs with: Supervision/Verbal cueing   Problem: Sit to Stand Goal: LTG:  Patient will perform sit to stand in prep for activites of daily living with assistance level (OT) Description: LTG:  Patient will perform sit to stand in prep for activites of daily living with assistance level (OT) Flowsheets (Taken 07/30/2022 1311) LTG: PT will perform sit to stand in prep for activites of daily living with assistance level: Supervision/Verbal cueing   Problem: RH Bathing Goal: LTG Patient will bathe all body parts with assist levels (OT) Description: LTG: Patient will bathe all body parts with assist levels (OT) Flowsheets (Taken 07/30/2022 1311) LTG: Pt will perform bathing with assistance level/cueing: Supervision/Verbal cueing   Problem: RH Dressing Goal: LTG Patient will perform upper body dressing (OT) Description: LTG Patient will perform upper body dressing with assist, with/without cues (OT). Flowsheets (Taken 07/30/2022 1311) LTG: Pt will perform upper body dressing with assistance level of: Independent Goal: LTG Patient will perform lower body dressing w/assist (OT) Description: LTG: Patient will perform lower body dressing with assist, with/without cues in positioning using equipment (OT) Flowsheets (Taken 07/30/2022 1311) LTG: Pt will perform lower body dressing with assistance level of: Minimal Assistance -  Patient > 75%   Problem: RH Toileting Goal: LTG Patient will perform toileting task (3/3 steps) with assistance level (OT) Description: LTG: Patient will perform toileting task (3/3 steps) with assistance level (OT)  Flowsheets (Taken 07/30/2022 1311) LTG: Pt will perform toileting task (3/3 steps) with assistance level: Minimal Assistance - Patient > 75%   Problem: RH Toilet Transfers Goal: LTG Patient will perform toilet transfers w/assist (OT) Description: LTG: Patient will perform toilet transfers with assist, with/without cues using equipment (OT) Flowsheets (Taken 07/30/2022 1311) LTG: Pt will perform toilet transfers with assistance level of: Supervision/Verbal cueing   Problem: RH Tub/Shower Transfers Goal: LTG Patient will perform tub/shower transfers w/assist (OT) Description: LTG: Patient will perform tub/shower transfers with assist, with/without cues using equipment (OT) Flowsheets (Taken 07/30/2022 1311) LTG: Pt will perform tub/shower stall transfers with assistance level of: Supervision/Verbal cueing LTG: Pt will perform tub/shower transfers from: Tub/shower combination

## 2022-07-30 NOTE — Progress Notes (Signed)
Occupational Therapy Session Note  Patient Details  Name: [name] MRN: [id number] Date of Birth: [birth year]  Today's Date: 07/30/2022 OT Individual Time: 1200-1225 OT Individual Time Calculation (min): 25 min    Short Term Goals: Week 1:  OT Short Term Goal 1 (Week 1): Patient will complete stand pivot or squat pivot transfer to toilet/commode with min assist OT Short Term Goal 2 (Week 1): Patient will complete toilet hygiene with mod assist OT Short Term Goal 3 (Week 1): Patient will dress lower body with mod assist OT Short Term Goal 4 (Week 1): Patient will don/doff Leff prostheses with set up assistance in preparation for functional transfers OT Short Term Goal 5 (Week 1): Patient will don pull over shirt without physical assistance  Skilled Therapeutic Interventions/Progress Updates:  1:1 Pt received in w/c from previous therapy.  Pt reported his left prosthetic wasn't buckling properly - helped to adjust and fasten. Also re-provided education on residual limb for right LE and positioning and watching for pressure points of wound VAC tubing. Pt reported being very sleep this morning and maybe was post anesthesia and med related. Setup pt up for lunch and pt did confirm that throat felt weird and choose to eat softer foods (rather than the pot roast). Pt left sitting up in w/c to finish lunch before HD.  Therapy Documentation Precautions:  Precautions Precautions: Fall, Other (comment) Precaution Comments: R LE wound vac Restrictions Weight Bearing Restrictions: Yes RLE Weight Bearing: Non weight bearing Other Position/Activity Restrictions: RLE  Pain: Pain Assessment Pain Scale: 0-10 Pain Score: 8  Faces Pain Scale: No hurt Pain Type: Phantom pain Pain Location: Foot Pain Orientation: Right Pain Descriptors / Indicators: Throbbing Pain Frequency: Intermittent Pain Onset: Progressive Pain Intervention(s): Medication (See eMAR) reported that his right foot was hurting-  requested meds for phantom pain   Therapy/Group: Individual Therapy  Roney Mans [facility] 07/30/2022, 3:12 PM

## 2022-07-30 NOTE — Discharge Instructions (Addendum)
Inpatient Rehab Discharge Instructions  [facility] Discharge date and time: No discharge date for patient encounter.   Activities/Precautions/ Functional Status: Activity: activity as tolerated Diet: regular diet Wound Care: Routine skin checks.  Wash stump incision warm soap and water.  Use Dial soap Functional status:  ___ No restrictions     ___ Walk up steps independently ___ 24/7 supervision/assistance   ___ Walk up steps with assistance ___ Intermittent supervision/assistance  ___ Bathe/dress independently ___ Walk with walker     _x__ Bathe/dress with assistance ___ Walk Independently    ___ Shower independently ___ Walk with assistance    ___ Shower with assistance ___ No alcohol     ___ Return to work/school ________  Special Instructions: No driving smoking or alcohol  Continue hemodialysis as directed  COMMUNITY REFERRALS UPON DISCHARGE:    Home Health:   PT  OT  RN                Agency:[facility] [phone]   Medical Equipment/Items Ordered:R-AMPUTEE PAD FOR WHEELCHAIR HE HAS FROM PREVIOUS ADMIT                                                 Agency/Supplier:[facility]  [phone]   My questions have been answered and I understand these instructions. I will adhere to these goals and the provided educational materials after my discharge from the hospital.  Patient/Caregiver Signature _______________________________ Date __________  Clinician Signature _______________________________________ Date __________  Please bring this form and your medication list with you to all your follow-up doctor's appointments.

## 2022-07-30 NOTE — Evaluation (Signed)
Occupational Therapy Assessment and Plan  Patient Details  Name: [name] MRN: [id number] Date of Birth: 13-Feb-1951  OT Diagnosis: altered mental status, muscle weakness (generalized), pain in joint, and swelling of limb Rehab Potential: Rehab Potential (ACUTE ONLY): Good ELOS: 10-12 days   Today's Date: 07/30/2022 OT Individual Time: 4098-1191 OT Individual Time Calculation (min): 85 min     Hospital Problem: Principal Problem:   Right below-knee amputee   Past Medical History:  Past Medical History:  Diagnosis Date   Acute osteomyelitis of metatarsal bone of left foot    Ambulates with cane    straight cane   Anemia    Cervical myelopathy 02/06/2018   Chronic kidney disease    dailysis [facility]- home   Community acquired pneumonia of left lower lobe of lung 08/18/2021   Complication of anesthesia    Coronary artery disease    Dehiscence of amputation stump of left lower extremity    Diabetes mellitus without complication    type2   Diabetic foot ulcer    Diabetic neuropathy 02/06/2018   Dysrhythmia    Gait abnormality 08/24/2018   GERD (gastroesophageal reflux disease)     01/06/20- not current   Gout    History of blood transfusion    History of blood transfusion    History of kidney stones    passed stones   Hypercholesteremia    Hypertension    Hypothyroidism    Neuromuscular disorder    neuropathy left leg and bilateral feet   Neuropathy    Partial nontraumatic amputation of foot, 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   PONV (postoperative nausea and vomiting)    Prostate cancer    PVD (peripheral vascular disease)    with amputations   Past Surgical History:  Past Surgical History:  Procedure Laterality Date   A-FLUTTER ABLATION N/A 04/06/2020   Procedure: A-FLUTTER ABLATION;  Surgeon: Marinus Maw, MD;  Location: [facility];  Service: Cardiovascular;  Laterality: N/A;   AMPUTATION Left 12/25/2013   Procedure: AMPUTATION RAY LEFT 5TH RAY;  Surgeon: Nadara Mustard, MD;  Location: [facility] ORS;  Service: Orthopedics;  Laterality: Left;   AMPUTATION Right 12/15/2018   Procedure: AMPUTATION OF 4TH AND 5TH TOES RIGHT FOOT;  Surgeon: Nadara Mustard, MD;  Location: [facility] OR;  Service: Orthopedics;  Laterality: Right;   AMPUTATION Left 02/02/2021   Procedure: LEFT FOOT 4TH RAY AMPUTATION;  Surgeon: Nadara Mustard, MD;  Location: [facility] OR;  Service: Orthopedics;  Laterality: Left;   AMPUTATION Left 05/09/2021   Procedure: LEFT BELOW KNEE AMPUTATION;  Surgeon: Nadara Mustard, MD;  Location: [facility] OR;  Service: Orthopedics;  Laterality: Left;   AMPUTATION Right 07/26/2022   Procedure: RIGHT BELOW KNEE AMPUTATION;  Surgeon: Nadara Mustard, MD;  Location: [facility] OR;  Service: Orthopedics;  Laterality: Right;   APPLICATION OF WOUND VAC Right 12/15/2018   Procedure: APPLICATION OF WOUND VAC;  Surgeon: Nadara Mustard, MD;  Location: [facility] OR;  Service: Orthopedics;  Laterality: Right;   AV FISTULA PLACEMENT Left 03/25/2019   Procedure: LEFT ARM ARTERIOVENOUS (AV) FISTULA CREATION;  Surgeon: Nada Libman, MD;  Location: [facility] OR;  Service: Vascular;  Laterality: Left;   BACK SURGERY     BASCILIC VEIN TRANSPOSITION Left 05/20/2019   Procedure: SECOND STAGE LEFT BASCILIC VEIN TRANSPOSITION;  Surgeon: Nada Libman, MD;  Location: [facility] OR;  Service: Vascular;  Laterality: Left;   BIOPSY  06/22/2022   Procedure: BIOPSY;  Surgeon: Meridee Score,  Netty Starring., MD;  Location: [facility] ENDOSCOPY;  Service: Gastroenterology;;   CARDIAC CATHETERIZATION  02/17/2014   CHOLECYSTECTOMY     COLONOSCOPY  2011   in [place], Normal   COLONOSCOPY N/A 06/23/2022   Procedure: COLONOSCOPY;  Surgeon: Sherrilyn Rist, MD;  Location: [facility] ENDOSCOPY;  Service: Gastroenterology;  Laterality: N/A;   CORONARY ARTERY BYPASS GRAFT  2008   CYSTOSCOPY N/A 06/29/2020   Procedure: CYSTOSCOPY WITH CLOT EVACUATION AND  FULGERATION;  Surgeon: Marcine Matar, MD;  Location: [facility] OR;  Service: Urology;  Laterality: N/A;   CYSTOSCOPY  WITH FULGERATION Bilateral 06/17/2020   Procedure: CYSTOSCOPY,BILATERAL RETROGRADE, CLOT EVACUATION WITH FULGERATION OF THE BLADDER;  Surgeon: Jannifer Hick, MD;  Location: [name] OR;  Service: Urology;  Laterality: Bilateral;   CYSTOSCOPY WITH FULGERATION N/A 06/28/2020   Procedure: CYSTOSCOPY WITH CLOT EVACUATION AND FULGERATION OF BLEEDERS;  Surgeon: Marcine Matar, MD;  Location: [facility] OR;  Service: Urology;  Laterality: N/A;  1 HR   ENTEROSCOPY N/A 06/22/2022   Procedure: ENTEROSCOPY;  Surgeon: Meridee Score Netty Starring., MD;  Location: [facility] ENDOSCOPY;  Service: Gastroenterology;  Laterality: N/A;   HEMOSTASIS CLIP PLACEMENT  06/22/2022   Procedure: HEMOSTASIS CLIP PLACEMENT;  Surgeon: Lemar Lofty., MD;  Location: [facility] ENDOSCOPY;  Service: Gastroenterology;;   HOT HEMOSTASIS N/A 06/22/2022   Procedure: HOT HEMOSTASIS (ARGON PLASMA COAGULATION/BICAP);  Surgeon: Lemar Lofty., MD;  Location: [facility] ENDOSCOPY;  Service: Gastroenterology;  Laterality: N/A;   I & D EXTREMITY Right 12/15/2018   Procedure: DEBRIDEMENT RIGHT FOOT;  Surgeon: Nadara Mustard, MD;  Location: [facility] OR;  Service: Orthopedics;  Laterality: Right;   I & D EXTREMITY Right 08/27/2019   Procedure: PARTIAL CUBOID EXCISION RIGHT FOOT;  Surgeon: Nadara Mustard, MD;  Location: [facility] OR;  Service: Orthopedics;  Laterality: Right;   I & D EXTREMITY Right 01/07/2020   Procedure: RIGHT FOOT EXCISION INFECTED BONE;  Surgeon: Nadara Mustard, MD;  Location: [facility] OR;  Service: Orthopedics;  Laterality: Right;   I & D EXTREMITY Right 05/17/2022   Procedure: IRRIGATION AND DEBRIDEMENT RIGHT FOOT ABSCESS;  Surgeon: Nadara Mustard, MD;  Location: [facility] OR;  Service: Orthopedics;  Laterality: Right;   NECK SURGERY     novemver 2019   POLYPECTOMY  06/22/2022   Procedure: POLYPECTOMY;  Surgeon: Mansouraty, Netty Starring., MD;  Location: [facility] ENDOSCOPY;  Service: Gastroenterology;;   POLYPECTOMY  06/23/2022   Procedure: POLYPECTOMY;  Surgeon: Sherrilyn Rist,  MD;  Location: [facility] ENDOSCOPY;  Service: Gastroenterology;;   STUMP REVISION Left 06/27/2021   Procedure: REVISION LEFT BELOW KNEE AMPUTATION;  Surgeon: Nadara Mustard, MD;  Location: [facility] OR;  Service: Orthopedics;  Laterality: Left;   SUBMUCOSAL LIFTING INJECTION  06/22/2022   Procedure: SUBMUCOSAL LIFTING INJECTION;  Surgeon: Meridee Score Netty Starring., MD;  Location: [facility] ENDOSCOPY;  Service: Gastroenterology;;   SUBMUCOSAL TATTOO INJECTION  06/22/2022   Procedure: SUBMUCOSAL TATTOO INJECTION;  Surgeon: Lemar Lofty., MD;  Location: [facility] ENDOSCOPY;  Service: Gastroenterology;;   TRANSURETHRAL RESECTION OF PROSTATE N/A 06/28/2020   Procedure: TRANSURETHRAL RESECTION OF THE PROSTATE (TURP);  Surgeon: Marcine Matar, MD;  Location: [facility] OR;  Service: Urology;  Laterality: N/A;   WISDOM TOOTH EXTRACTION      Assessment & Plan Clinical Impression: 72 year old right-handed male well-known to rehab services from left BKA receiving CIR 05/11/2021 - 05/24/2021 as well as history significant for hypertension, hyperlipidemia, CAD/CABG 2008, atrial flutter with ablation 04/06/2020, diabetes mellitus, peripheral vascular disease, prostate cancer with TURP,  quit smoking 33 years ago, end-stage renal disease with hemodialysis and chronic anemia. Per chart review patient lives with spouse. 1 level home with ramped entrance. Patient can ambulate with a rollator prior to admission with prosthesis and needing some assist for ADLs.   Patient transferred to CIR on 07/29/2022 .    Patient currently requires max with basic self-care skills secondary to muscle weakness, decreased initiation, decreased attention, decreased awareness, decreased problem solving, decreased safety awareness, decreased memory, and delayed processing, and decreased sitting balance, decreased standing balance, decreased postural control, and decreased balance strategies.  Prior to hospitalization, patient could complete BADL with min assist  .  Patient will benefit from skilled intervention to decrease level of assist with basic self-care skills and increase independence with basic self-care skills prior to discharge home with care partner.  Anticipate patient will require 24 hour supervision and minimal physical assistance and follow up home health.  OT - End of Session Activity Tolerance: Decreased this session Endurance Deficit: Yes Endurance Deficit Description: general weakness and very delayed motoric response OT Assessment Rehab Potential (ACUTE ONLY): Good OT Patient demonstrates impairments in the following area(s): Balance;Safety;Cognition;Endurance;Skin Integrity;Behavior OT Basic ADL's Functional Problem(s): Bathing;Dressing;Toileting OT Transfers Functional Problem(s): Toilet;Tub/Shower OT Plan OT Intensity: Minimum of 1-2 x/day, 45 to 90 minutes OT Frequency: 5 out of 7 days OT Duration/Estimated Length of Stay: 10-12 days OT Treatment/Interventions: Balance/vestibular training;Discharge planning;Pain management;Self Care/advanced ADL retraining;Therapeutic Activities;Cognitive remediation/compensation;Disease mangement/prevention;Functional mobility training;Patient/family education;Skin care/wound managment;Therapeutic Exercise;DME/adaptive equipment instruction;Community reintegration;Psychosocial support;UE/LE Strength taining/ROM;Wheelchair propulsion/positioning OT Self Feeding Anticipated Outcome(s): Independent OT Basic Self-Care Anticipated Outcome(s): Min assist OT Toileting Anticipated Outcome(s): Min assist OT Bathroom Transfers Anticipated Outcome(s): Min assist OT Recommendation Patient destination: Home Follow Up Recommendations: Home health OT Equipment Recommended: To be determined Equipment Details: Family not here to verify patient's equipment from prior rehab stay   OT Evaluation Precautions/Restrictions  Precautions Precautions: Fall;Other (comment) Precaution Comments: R LE wound  vac Restrictions Weight Bearing Restrictions: Yes RLE Weight Bearing: Non weight bearing Other Position/Activity Restrictions: RLE Pain Pain Assessment Pain Scale: 0-10 Pain Score: 8  Faces Pain Scale: No hurt Pain Type: Phantom pain Pain Location: Foot Pain Orientation: Right Pain Descriptors / Indicators: Throbbing Pain Frequency: Intermittent Pain Onset: Progressive Pain Intervention(s): Medication (See eMAR) Home Living/Prior Functioning Home Living Living Arrangements: Spouse/significant other Available Help at Discharge: Family, Available 24 hours/day Type of Home: House Home Access: Ramped entrance Home Layout: One level Bathroom Shower/Tub: Health visitor: Standard Bathroom Accessibility: No Additional Comments: WC cannot access toilet  Lives With: Spouse IADL History Leisure and Hobbies: (P) reading history books Prior Function Level of Independence: Other (comment) (Need to gather information from family - patient unable to accurately report this session) Vocation Requirements: furniture business Vision Baseline Vision/History: 1 Wears glasses Ability to See in Adequate Light: 0 Adequate Patient Visual Report: No change from baseline Vision Assessment?: No apparent visual deficits Perception  Perception: Within Functional Limits Praxis Praxis: Intact Cognition Cognition Overall Cognitive Status: Impaired/Different from baseline Arousal/Alertness: Lethargic Orientation Level: Situation Attention: Focused Focused Attention: Impaired Focused Attention Impairment: Functional basic Awareness: Impaired Awareness Impairment: Intellectual impairment Problem Solving: Impaired Problem Solving Impairment: Functional basic Executive Function: Organizing;Initiating Organizing: Impaired Organizing Impairment: Functional basic Initiating: Impaired Initiating Impairment: Functional basic Behaviors: Perseveration Safety/Judgment: Impaired Brief  Interview for Mental Status (BIMS) Repetition of Three Words (First Attempt): 3 Temporal Orientation: Year: Correct Temporal Orientation: Month: Accurate within 5 days Temporal Orientation: Day: Correct Recall: "Sock": Yes, no cue required Recall: "  Blue": Yes, no cue required Recall: "Bed": Yes, no cue required BIMS Summary Score: 15 Sensation Sensation Light Touch: Appears Intact Hot/Cold: Not tested Proprioception: Not tested Stereognosis: Not tested Coordination Gross Motor Movements are Fluid and Coordinated: Yes (BUE) Fine Motor Movements are Fluid and Coordinated: Not tested Motor  Motor Motor: Abnormal postural alignment and control Motor - Skilled Clinical Observations: Mild - sits with flexion in thoracic spine and slightly forward head  Trunk/Postural Assessment  Cervical Assessment Cervical Assessment: Within Functional Limits Thoracic Assessment Thoracic Assessment: Within Functional Limits Lumbar Assessment Lumbar Assessment: Within Functional Limits Postural Control Postural Control: Within Functional Limits  Balance Balance Balance Assessed: Yes Static Sitting Balance Static Sitting - Balance Support: No upper extremity supported Static Sitting - Level of Assistance: 5: Stand by assistance Dynamic Sitting Balance Dynamic Sitting - Balance Support: No upper extremity supported;During functional activity Dynamic Sitting - Level of Assistance: 4: Min assist Dynamic Sitting - Balance Activities: Reaching for objects Static Standing Balance Static Standing - Balance Support: Bilateral upper extremity supported;During functional activity Static Standing - Level of Assistance: 4: Min assist Extremity/Trunk Assessment RUE Assessment RUE Assessment: Within Functional Limits LUE Assessment LUE Assessment: Within Functional Limits  Care Tool Care Tool Self Care Eating   Eating Assist Level: Set up assist    Oral Care    Oral Care Assist Level: Minimal  Assistance - Patient > 75%    Bathing   Body parts bathed by patient: Face;Right arm;Left arm;Chest;Abdomen;Right upper leg;Left upper leg Body parts bathed by helper: Buttocks;Front perineal area Body parts n/a: Right lower leg;Left lower leg Assist Level: Moderate Assistance - Patient 50 - 74%    Upper Body Dressing(including orthotics)   What is the patient wearing?: Pull over shirt   Assist Level: Minimal Assistance - Patient > 75%    Lower Body Dressing (excluding footwear)   What is the patient wearing?: Underwear/pull up;Pants Assist for lower body dressing: Total Assistance - Patient < 25%    Putting on/Taking off footwear Putting on/taking off footwear activity did not occur: N/A   Assist for footwear: Dependent - Patient 0%       Care Tool Toileting Toileting activity   Assist for toileting: Total Assistance - Patient < 25%     Care Tool Bed Mobility Roll left and right activity   Roll left and right assist level: Independent    Sit to lying activity   Sit to lying assist level: Independent    Lying to sitting on side of bed activity Lying to sitting on side of bed activity did not occur: the ability to move from lying on the back to sitting on the side of the bed with no back support.: N/A Lying to sitting on side of bed assist level: the ability to move from lying on the back to sitting on the side of the bed with no back support.: Independent     Care Tool Transfers Sit to stand transfer   Sit to stand assist level: Moderate Assistance - Patient 50 - 74%    Chair/bed transfer   Chair/bed transfer assist level: Dependent - mechanical lift     Toilet transfer   Assist Level: Dependent - Patient 0%     Care Tool Cognition  Expression of Ideas and Wants Expression of Ideas and Wants: 3. Some difficulty - exhibits some difficulty with expressing needs and ideas (e.g, some words or finishing thoughts) or speech is not clear  Understanding Verbal and  Non-Verbal Content Understanding  Verbal and Non-Verbal Content: 3. Usually understands - understands most conversations, but misses some part/intent of message. Requires cues at times to understand   Memory/Recall Ability Memory/Recall Ability : That he or she is in a hospital/hospital unit   Refer to Care Plan for Long Term Goals  SHORT TERM GOAL WEEK 1 OT Short Term Goal 1 (Week 1): Patient will complete stand pivot or squat pivot transfer to toilet/commode with min assist OT Short Term Goal 2 (Week 1): Patient will complete toilet hygiene with mod assist OT Short Term Goal 3 (Week 1): Patient will dress lower body with mod assist OT Short Term Goal 4 (Week 1): Patient will don/doff Leff prostheses with set up assistance in preparation for functional transfers OT Short Term Goal 5 (Week 1): Patient will don pull over shirt without physical assistance  Recommendations for other services: None    Skilled Therapeutic Intervention:   Patient received supine in bed - holding urinal but not voiding.  Patient having difficulty maintaining alertness.  Very delayed motoric or verbal response to directions or questions.  Patient became slightly more alert as session went on. BADL status as indicated.  Patient insistent on using [employer] for transfers, although indicates he does not have ne at home.  Patient unable to accurately report prior level of functioning or equipment at home this session.  May need to confirm with family.  Patient left up in wheelchair for breakfast.  Safety belt in place and engaged and call bell / personal items in reach.   ADL ADL Eating: Set up Where Assessed-Eating: Wheelchair Grooming: Minimal assistance Where Assessed-Grooming: Sitting at sink Upper Body Bathing: Moderate cueing;Minimal assistance Lower Body Bathing: Dependent Where Assessed-Lower Body Bathing: Sitting at sink;Standing at sink Upper Body Dressing: Minimal assistance Where Assessed-Upper Body Dressing:  Sitting at sink Lower Body Dressing: Dependent Where Assessed-Lower Body Dressing: Sitting at sink;Standing at sink Toileting: Dependent Where Assessed-Toileting: Toilet;Bedside Commode Toilet Transfer: Moderate assistance Antony Salmon) Toilet Transfer Method: Other (comment) Antony Salmon) Toilet Transfer Equipment: Bedside commode Tub/Shower Transfer: Unable to assess Tub/Shower Transfer Method: Unable to assess Film/video editor: Unable to assess [employer] Method: Unable to assess Mobility  Bed Mobility Bed Mobility: Rolling Left;Supine to Sit Rolling Left: Supervision/Verbal cueing Supine to Sit: Supervision/Verbal cueing Transfers Sit to Stand: Minimal Assistance - Patient > 75% Stand to Sit: Moderate Assistance - Patient 50-74%   Discharge Criteria: Patient will be discharged from OT if patient refuses treatment 3 consecutive times without medical reason, if treatment goals not met, if there is a change in medical status, if patient makes no progress towards goals or if patient is discharged from hospital.  The above assessment, treatment plan, treatment alternatives and goals were discussed and mutually agreed upon: by patient  Collier Salina 07/30/2022, 12:59 PM

## 2022-07-30 NOTE — Progress Notes (Signed)
Patient ID: DAMERE BRANDENBURG, male   DOB: 06/05/1950, 72 y.o.   MRN: [id number] Met with the patient to review current situation, rehab process, team conference and plan of care. Discussed secondary risk management including DM, HTN, HLD, CAD and PVD. Patient noted trouble swallowing; throat is sore, reviewed  ARF with AMS/encephalopathy upon admission requiring intubation. Continue wound vac to incision; no drainage noted. Pain is managed. Poor appetite; reports at something light for breakfast and sometimes skipped lunch. Ordered out for lunch or dinner meal.  Review dietary supplements ( Vit D, Vit C and Zinc) for wound healing. Noted pruritus; requested lotion or medication for itching. Patient requesting respite service information for wife; companion service or [facility] senior companion service at discharge.  Forwarded information to the SW. No other issues noted at present. Continue to follow along to address educational needs to facilitate preparation for discharge. Pamelia Hoit

## 2022-07-30 NOTE — Evaluation (Signed)
Physical Therapy Assessment and Plan  Patient Details  Name: [name] MRN: [id number] Date of Birth: 07-Jul-1950  PT Diagnosis: Abnormality of gait, Difficulty walking, Impaired cognition, and Muscle weakness Rehab Potential: Good ELOS: 12-14 days   Today's Date: 07/30/2022 PT Individual Time: 1045-1200 PT Individual Time Calculation (min): 75 min    Hospital Problem: Principal Problem:   Right below-knee amputee   Past Medical History:  Past Medical History:  Diagnosis Date   Acute osteomyelitis of metatarsal bone of left foot    Ambulates with cane    straight cane   Anemia    Cervical myelopathy 02/06/2018   Chronic kidney disease    dailysis [facility]- home   Community acquired pneumonia of left lower lobe of lung 08/18/2021   Complication of anesthesia    Coronary artery disease    Dehiscence of amputation stump of left lower extremity    Diabetes mellitus without complication    type2   Diabetic foot ulcer    Diabetic neuropathy 02/06/2018   Dysrhythmia    Gait abnormality 08/24/2018   GERD (gastroesophageal reflux disease)     01/06/20- not current   Gout    History of blood transfusion    History of blood transfusion    History of kidney stones    passed stones   Hypercholesteremia    Hypertension    Hypothyroidism    Neuromuscular disorder    neuropathy left leg and bilateral feet   Neuropathy    Partial nontraumatic amputation of foot, 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   PONV (postoperative nausea and vomiting)    Prostate cancer    PVD (peripheral vascular disease)    with amputations   Past Surgical History:  Past Surgical History:  Procedure Laterality Date   A-FLUTTER ABLATION N/A 04/06/2020   Procedure: A-FLUTTER ABLATION;  Surgeon: Marinus Maw, MD;  [facility]: [facility];  Service: Cardiovascular;  Laterality: N/A;   AMPUTATION Left 12/25/2013   Procedure: AMPUTATION RAY LEFT 5TH RAY;  Surgeon: Nadara Mustard, MD;  Location: [facility] ORS;  Service:  Orthopedics;  Laterality: Left;   AMPUTATION Right 12/15/2018   Procedure: AMPUTATION OF 4TH AND 5TH TOES RIGHT FOOT;  Surgeon: Nadara Mustard, MD;  Location: [facility] OR;  Service: Orthopedics;  Laterality: Right;   AMPUTATION Left 02/02/2021   Procedure: LEFT FOOT 4TH RAY AMPUTATION;  Surgeon: Nadara Mustard, MD;  Location: [facility] OR;  Service: Orthopedics;  Laterality: Left;   AMPUTATION Left 05/09/2021   Procedure: LEFT BELOW KNEE AMPUTATION;  Surgeon: Nadara Mustard, MD;  Location: [facility] OR;  Service: Orthopedics;  Laterality: Left;   AMPUTATION Right 07/26/2022   Procedure: RIGHT BELOW KNEE AMPUTATION;  Surgeon: Nadara Mustard, MD;  Location: [facility] OR;  Service: Orthopedics;  Laterality: Right;   APPLICATION OF WOUND VAC Right 12/15/2018   Procedure: APPLICATION OF WOUND VAC;  Surgeon: Nadara Mustard, MD;  Location: [facility] OR;  Service: Orthopedics;  Laterality: Right;   AV FISTULA PLACEMENT Left 03/25/2019   Procedure: LEFT ARM ARTERIOVENOUS (AV) FISTULA CREATION;  Surgeon: Nada Libman, MD;  Location: [facility] OR;  Service: Vascular;  Laterality: Left;   BACK SURGERY     BASCILIC VEIN TRANSPOSITION Left 05/20/2019   Procedure: SECOND STAGE LEFT BASCILIC VEIN TRANSPOSITION;  Surgeon: Nada Libman, MD;  Location: [facility] OR;  Service: Vascular;  Laterality: Left;   BIOPSY  06/22/2022   Procedure: BIOPSY;  Surgeon: Lemar Lofty., MD;  Location: [facility] ENDOSCOPY;  Service: Gastroenterology;;   CARDIAC CATHETERIZATION  02/17/2014   CHOLECYSTECTOMY     COLONOSCOPY  2011   in [place], Normal   COLONOSCOPY N/A 06/23/2022   Procedure: COLONOSCOPY;  Surgeon: Sherrilyn Rist, MD;  Location: [facility] ENDOSCOPY;  Service: Gastroenterology;  Laterality: N/A;   CORONARY ARTERY BYPASS GRAFT  2008   CYSTOSCOPY N/A 06/29/2020   Procedure: CYSTOSCOPY WITH CLOT EVACUATION AND  FULGERATION;  Surgeon: Marcine Matar, MD;  Location: [facility] OR;  Service: Urology;  Laterality: N/A;   CYSTOSCOPY WITH FULGERATION Bilateral 06/17/2020    Procedure: CYSTOSCOPY,BILATERAL RETROGRADE, CLOT EVACUATION WITH FULGERATION OF THE BLADDER;  Surgeon: Jannifer Hick, MD;  Location: [facility] OR;  Service: Urology;  Laterality: Bilateral;   CYSTOSCOPY WITH FULGERATION N/A 06/28/2020   Procedure: CYSTOSCOPY WITH CLOT EVACUATION AND FULGERATION OF BLEEDERS;  Surgeon: Marcine Matar, MD;  Location: [facility] OR;  Service: Urology;  Laterality: N/A;  1 HR   ENTEROSCOPY N/A 06/22/2022   Procedure: ENTEROSCOPY;  Surgeon: Meridee Score Netty Starring., MD;  Location: [facility] ENDOSCOPY;  Service: Gastroenterology;  Laterality: N/A;   HEMOSTASIS CLIP PLACEMENT  06/22/2022   Procedure: HEMOSTASIS CLIP PLACEMENT;  Surgeon: Lemar Lofty., MD;  Location: [facility] ENDOSCOPY;  Service: Gastroenterology;;   HOT HEMOSTASIS N/A 06/22/2022   Procedure: HOT HEMOSTASIS (ARGON PLASMA COAGULATION/BICAP);  Surgeon: Lemar Lofty., MD;  Location: [facility] ENDOSCOPY;  Service: Gastroenterology;  Laterality: N/A;   I & D EXTREMITY Right 12/15/2018   Procedure: DEBRIDEMENT RIGHT FOOT;  Surgeon: Nadara Mustard, MD;  Location: [facility] OR;  Service: Orthopedics;  Laterality: Right;   I & D EXTREMITY Right 08/27/2019   Procedure: PARTIAL CUBOID EXCISION RIGHT FOOT;  Surgeon: Nadara Mustard, MD;  Location: [facility] OR;  Service: Orthopedics;  Laterality: Right;   I & D EXTREMITY Right 01/07/2020   Procedure: RIGHT FOOT EXCISION INFECTED BONE;  Surgeon: Nadara Mustard, MD;  Location: [facility] OR;  Service: Orthopedics;  Laterality: Right;   I & D EXTREMITY Right 05/17/2022   Procedure: IRRIGATION AND DEBRIDEMENT RIGHT FOOT ABSCESS;  Surgeon: Nadara Mustard, MD;  Location: [name] OR;  Service: Orthopedics;  Laterality: Right;   NECK SURGERY     novemver 2019   POLYPECTOMY  06/22/2022   Procedure: POLYPECTOMY;  Surgeon: Mansouraty, Netty Starring., MD;  Location: [facility] ENDOSCOPY;  Service: Gastroenterology;;   POLYPECTOMY  06/23/2022   Procedure: POLYPECTOMY;  Surgeon: Sherrilyn Rist, MD;  Location: [facility] ENDOSCOPY;  Service:  Gastroenterology;;   STUMP REVISION Left 06/27/2021   Procedure: REVISION LEFT BELOW KNEE AMPUTATION;  Surgeon: Nadara Mustard, MD;  Location: [facility] OR;  Service: Orthopedics;  Laterality: Left;   SUBMUCOSAL LIFTING INJECTION  06/22/2022   Procedure: SUBMUCOSAL LIFTING INJECTION;  Surgeon: Meridee Score Netty Starring., MD;  Location: [facility] ENDOSCOPY;  Service: Gastroenterology;;   SUBMUCOSAL TATTOO INJECTION  06/22/2022   Procedure: SUBMUCOSAL TATTOO INJECTION;  Surgeon: Lemar Lofty., MD;  Location: [facility] ENDOSCOPY;  Service: Gastroenterology;;   TRANSURETHRAL RESECTION OF PROSTATE N/A 06/28/2020   Procedure: TRANSURETHRAL RESECTION OF THE PROSTATE (TURP);  Surgeon: Marcine Matar, MD;  Location: [facility] OR;  Service: Urology;  Laterality: N/A;   WISDOM TOOTH EXTRACTION      Assessment & Plan Clinical Impression: Patient is a 72 year old right-handed male well-known to rehab services from left BKA receiving CIR 05/11/2021 - 05/24/2021 as well as history significant for hypertension, hyperlipidemia, CAD/CABG 2008, atrial flutter with ablation 04/06/2020, diabetes mellitus, peripheral vascular disease, prostate cancer with TURP, quit smoking 33 years ago,  end-stage renal disease with hemodialysis and chronic anemia. Per chart review patient lives with spouse. 1 level home with ramped entrance. Patient can ambulate with a rollator prior to admission with prosthesis and needing some assist for ADLs. Wife with good support. Presented 07/22/2022 with altered mental status x 2 days low-grade fever and hypoxia as well as chronic right foot wound. He required BiPAP on presentation with initial improvement but later his mental status declined and deteriorated subsequently required intubation and was extubated 07/23/2022. COVID-19/influenza/RSV PCR were negative. Chest x-ray unremarkable, cranial CT scan negative for acute changes, CT chest abdomen and pelvis with no acute abnormality except for mildly nodular hepatic contour  (question cirrhosis) small volume ascites. No findings specific for metastatic disease.. Admission chemistries unremarkable except sodium 132 chloride 89 glucose 125 BUN 48 creatinine 7.55, hemoglobin 7.9, blood cultures no growth to date, lactic acid within normal limits. Renal service follow-up with hemodialysis ongoing as directed. Dr. Lajoyce Corners consulted in regards to abscess of right foot with x-ray showing subcutaneous gas within the soft tissues lateral and plantar to the fifth tarsometatarsal joint. No superimposed osseous erosion identified to suggest osteomyelitis. Limb was not felt to be salvageable underwent right BKA 07/26/2022 per Dr. Lajoyce Corners and wound VAC was applied postoperatively. Patient was cleared for subcutaneous heparin for DVT prophylaxis. Acute on chronic anemia latest hemoglobin 9.8. Therapy evaluations completed due to patient decreased functional mobility was admitted for a comprehensive rehab program. Patient transferred to CIR on 07/29/2022 .   Patient currently requires max with mobility secondary to muscle weakness and activity intolerance .  Prior to hospitalization, patient was modified independent  with mobility and lived with Spouse in a House home.  Home access is  Ramped entrance.  Patient will benefit from skilled PT intervention to maximize safe functional mobility, minimize fall risk, and decrease caregiver burden for planned discharge home with 24 hour assist.  Anticipate patient will benefit from follow up [facility] at discharge.  PT - End of Session Activity Tolerance: Tolerates 30+ min activity with multiple rests Endurance Deficit: Yes Endurance Deficit Description: general weakness PT Assessment Rehab Potential (ACUTE/IP ONLY): Good PT Barriers to Discharge: Inaccessible home environment;Decreased caregiver support;Weight bearing restrictions PT Barriers to Discharge Comments: difficulty accessing toilet with WC or walker; WC arm does no lift for slide transfer, but needs  current WC 2/2 light weight for wife to manage PT Patient demonstrates impairments in the following area(s): Balance;Edema;Endurance;Pain;Safety PT Transfers Functional Problem(s): Bed Mobility;Car;Bed to Chair PT Locomotion Functional Problem(s): Wheelchair Mobility PT Plan PT Intensity: Minimum of 1-2 x/day ,45 to 90 minutes PT Frequency: 5 out of 7 days PT Duration Estimated Length of Stay: 12-14 days PT Treatment/Interventions: Ambulation/gait training;Balance/vestibular training;Cognitive remediation/compensation;Discharge planning;Disease management/prevention;DME/adaptive equipment instruction;Functional mobility training;Pain management;Patient/family education;Psychosocial support;Therapeutic Activities;Skin care/wound management;Therapeutic Exercise;UE/LE Strength taining/ROM;UE/LE Coordination activities;Wheelchair propulsion/positioning PT Transfers Anticipated Outcome(s): minA PT Locomotion Anticipated Outcome(s): modI with WC PT Recommendation Follow Up Recommendations: Home health PT Patient destination: Home Equipment Recommended: To be determined   PT Evaluation Precautions/Restrictions Precautions Precautions: Fall;Other (comment) Precaution Comments: R LE wound vac Restrictions Weight Bearing Restrictions: Yes RLE Weight Bearing: Non weight bearing General Chart Reviewed: Yes Family/Caregiver Present: No Vital Signs Pain Pain Assessment Pain Scale: 0-10 Pain Score: 0-No pain Pain Interference Pain Interference Pain Effect on Sleep: 2. Occasionally Pain Interference with Therapy Activities: 1. Rarely or not at all Pain Interference with Day-to-Day Activities: 1. Rarely or not at all Home Living/Prior Functioning Home Living Available Help at Discharge: Family;Available 24 hours/day  Type of Home: House Home Access: Ramped entrance Home Layout: One level Bathroom Shower/Tub: Health visitor: Standard Bathroom Accessibility: No Additional  Comments: WC cannot access toilet  Lives With: Spouse Vision/Perception  Vision - History Ability to See in Adequate Light: 0 Adequate  Cognition Overall Cognitive Status: Impaired/Different from baseline Orientation Level: Oriented X4 Sensation Sensation Light Touch: Appears Intact Motor  Motor Motor: Within Functional Limits   Trunk/Postural Assessment  Cervical Assessment Cervical Assessment: Within Functional Limits Thoracic Assessment Thoracic Assessment: Within Functional Limits Lumbar Assessment Lumbar Assessment: Within Functional Limits  Balance Balance Balance Assessed: Yes Static Sitting Balance Static Sitting - Balance Support: Bilateral upper extremity supported Static Sitting - Level of Assistance: 6: Modified independent (Device/Increase time) Dynamic Sitting Balance Dynamic Sitting - Balance Support: Bilateral upper extremity supported Dynamic Sitting - Level of Assistance: 4: Min assist Dynamic Sitting - Balance Activities: Reaching for objects Static Standing Balance Static Standing - Balance Support: Bilateral upper extremity supported Static Standing - Level of Assistance: 4: Min assist Extremity Assessment      RLE Assessment RLE Assessment: Not tested General Strength Comments: hip fl gross 4/5 LLE Assessment LLE Assessment: Exceptions to [facility] General Strength Comments: hip fl gross 4/5  Care Tool Care Tool Bed Mobility Roll left and right activity   Roll left and right assist level: Independent    Sit to lying activity   Sit to lying assist level: Independent    Lying to sitting on side of bed activity   Lying to sitting on side of bed assist level: the ability to move from lying on the back to sitting on the side of the bed with no back support.: Independent     Care Tool Transfers Sit to stand transfer   Sit to stand assist level: Minimal Assistance - Patient > 75% (with STEDY)    Chair/bed transfer   Chair/bed transfer assist  level: Dependent - Control and instrumentation engineer transfer activity did not occur: Safety/medical concerns (2/2 pt fatigue)        Care Tool Locomotion Ambulation Ambulation activity did not occur: Safety/medical concerns (2/2 pt fatigue and LLE prosthetic stability)        Walk 10 feet activity Walk 10 feet activity did not occur: Safety/medical concerns (2/2 pt fatigue and LLE prosthetic stability)       Walk 50 feet with 2 turns activity Walk 50 feet with 2 turns activity did not occur: Safety/medical concerns (2/2 pt fatigue and LLE prosthetic stability)      Walk 150 feet activity Walk 150 feet activity did not occur: Safety/medical concerns (2/2 pt fatigue and LLE prosthetic stability)      Walk 10 feet on uneven surfaces activity Walk 10 feet on uneven surfaces activity did not occur: Safety/medical concerns (2/2 pt fatigue and LLE prosthetic stability)      Stairs Stair activity did not occur: Safety/medical concerns (2/2 pt fatigue and LLE prosthetic stability)        Walk up/down 1 step activity Walk up/down 1 step or curb (drop down) activity did not occur: Safety/medical concerns (2/2 pt fatigue and LLE prosthetic stability)      Walk up/down 4 steps activity Walk up/down 4 steps activity did not occur: Safety/medical concerns (2/2 pt fatigue and LLE prosthetic stability)      Walk up/down 12 steps activity Walk up/down 12 steps activity did not occur: Safety/medical concerns (2/2 pt  fatigue and LLE prosthetic stability)      Pick up small objects from floor Pick up small object from the floor (from standing position) activity did not occur: Safety/medical concerns (2/2 pt fatigue and LLE prosthetic stability)      Wheelchair Is the patient using a wheelchair?: Yes Type of Wheelchair: Manual   Wheelchair assist level: Supervision/Verbal cueing Max wheelchair distance: 150  Wheel 50 feet with 2 turns activity   Assist Level:  Supervision/Verbal cueing  Wheel 150 feet activity   Assist Level: Supervision/Verbal cueing    Refer to Care Plan for Long Term Goals  SHORT TERM GOAL WEEK 1 PT Short Term Goal 1 (Week 1): Pt will completed sit to stand from [facility] with ModA + LRAD PT Short Term Goal 2 (Week 1): Pt will maintain static standing balance with CGA + LRAD PT Short Term Goal 3 (Week 1): Pt will initiate SPT practice with LRAD  Recommendations for other services: None   Skilled Therapeutic Intervention Mobility Transfers Transfers: Sit to Stand Sit to Stand: Minimal Assistance - Patient > 75% Transfer via Lift Equipment: Youth worker Ambulation: No Gait Gait: No Stairs / Additional Locomotion Stairs: No Corporate treasurer: Yes Wheelchair Assistance: Doctor, general practice: Both upper extremities Wheelchair Parts Management: Independent Distance: 150  Session Note: Chart reviewed and pt agreeable to therapy. Pt received seated in WC with no c/o pain. Session focused on evaluation and functional transfer practice to promote return to home mobility. Pt initiated session with evaluation as described above. Pt then attempted sit to stand with RW. Pt able to partially stand with MaxA + Rw, but energy limited for full stand. Pt then completed STEDY transfer to toilet with MinA. Pt noted to require VC for safe sitting. Pt then navigated 189ft in WC with supervision and VC for safety. Session education emphasized role of PT, care goals, and early discharge planning. At end of session, pt was left seated in [facility] with alarm engaged, nurse call bell and all needs in reach.     Discharge Criteria: Patient will be discharged from PT if patient refuses treatment 3 consecutive times without medical reason, if treatment goals not met, if there is a change in medical status, if patient makes no progress towards goals or if patient is discharged from hospital.  The  above assessment, treatment plan, treatment alternatives and goals were discussed and mutually agreed upon: by patient  LACY TAGLIERI 07/30/2022, 12:25 PM

## 2022-07-30 NOTE — Progress Notes (Signed)
Received patient in bed to unit.  Alert and oriented.  Informed consent signed and in chart.   TX duration:3.5  Patient tolerated well.  Transported back to the room  Alert, without acute distress.  Hand-off given to patient's nurse.   Access used: left AVF Access issues: none  Total UF removed: 2L Medication(s) given: none   07/30/22 1828  Vitals  Temp 98.1 F (36.7 C)  Temp Source Oral  BP (!) 167/63  MAP (mmHg) 95  BP Location Right Arm  BP Method Automatic  Patient Position (if appropriate) Lying  Pulse Rate 70  Pulse Rate Source Monitor  ECG Heart Rate 74  Resp 18  Oxygen Therapy  SpO2 99 %  O2 Device Room Air  During Treatment Monitoring  HD Safety Checks Performed Yes  Intra-Hemodialysis Comments Tx completed;Tolerated well  Dialysis Fluid Bolus Normal Saline  Bolus Amount (mL) 300 mL  Fistula / Graft Left Upper arm Arteriovenous fistula  No placement date or time found.   Orientation: Left  Access Location: Upper arm  Access Type: Arteriovenous fistula  Status Flushed;Deaccessed      Brach Birdsall [facility]

## 2022-07-31 DIAGNOSIS — Z89511 Acquired absence of right leg below knee: Secondary | ICD-10-CM | POA: Diagnosis not present

## 2022-07-31 LAB — GLUCOSE, CAPILLARY: Glucose-Capillary: 109 mg/dL — ABNORMAL HIGH (ref 70–99)

## 2022-07-31 LAB — HEPATITIS B SURFACE ANTIBODY, QUANTITATIVE: Hep B S AB Quant (Post): <3.5 m[IU]/mL — ABNORMAL LOW (ref >9.9)

## 2022-07-31 MED ORDER — CHLORHEXIDINE GLUCONATE CLOTH 2 % EX PADS: 6.0000 | MEDICATED_PAD | Freq: Every day | CUTANEOUS | Status: DC

## 2022-07-31 MED ORDER — NAPHAZOLINE-GLYCERIN 0.012-0.25 % OP SOLN
1.0000 [drp] | Freq: Four times a day (QID) | OPHTHALMIC | Status: DC | PRN
  Administered 2022-07-31: 2 [drp] via OPHTHALMIC
  Filled 2022-07-31: qty 15

## 2022-07-31 MED ORDER — ENOXAPARIN SODIUM 30 MG/0.3ML IJ SOSY
30.0000 mg | PREFILLED_SYRINGE | INTRAMUSCULAR | Status: DC
  Administered 2022-08-01 – 2022-08-11 (×10): 30 mg via SUBCUTANEOUS
  Filled 2022-07-31 (×10): qty 0.3

## 2022-07-31 MED ORDER — MODAFINIL 100 MG PO TABS
200.0000 mg | ORAL_TABLET | Freq: Every day | ORAL | Status: DC
  Administered 2022-08-01 – 2022-08-11 (×11): 200 mg via ORAL
  Filled 2022-07-31 (×11): qty 2

## 2022-07-31 MED ORDER — ENOXAPARIN SODIUM 40 MG/0.4ML IJ SOSY: 40.0000 mg | PREFILLED_SYRINGE | INTRAMUSCULAR | Status: DC

## 2022-07-31 MED ORDER — PREGABALIN 25 MG PO CAPS
25.0000 mg | ORAL_CAPSULE | Freq: Every day | ORAL | Status: DC
  Administered 2022-07-31 – 2022-08-02 (×3): 25 mg via ORAL
  Filled 2022-07-31 (×3): qty 1

## 2022-07-31 MED ORDER — DULOXETINE HCL 20 MG PO CPEP
20.0000 mg | ORAL_CAPSULE | Freq: Every day | ORAL | Status: DC
  Administered 2022-07-31: 20 mg via ORAL
  Filled 2022-07-31: qty 1

## 2022-07-31 MED ORDER — PREGABALIN 25 MG PO CAPS
25.0000 mg | ORAL_CAPSULE | Freq: Once | ORAL | Status: AC
  Administered 2022-07-31: 25 mg via ORAL
  Filled 2022-07-31: qty 1

## 2022-07-31 NOTE — Progress Notes (Signed)
Patient ID: [name], male   DOB: 1950/10/02, 72 y.o.   MRN: [id number]  Team Conference Report to Patient/Family  Team Conference discussion was reviewed with the patient and caregiver, including goals, any changes in plan of care and target discharge date.  Patient and caregiver express understanding and are in agreement.  The patient has a target discharge date of 08/11/22.  Sw met with patient and provided team conference updates. [facility] plans to visit patient today. Patient expressed that his spouse is still working on finding services at home for relief. No additional questions or concerns.   Andria Rhein 07/31/2022, 2:38 PM

## 2022-07-31 NOTE — Patient Care Conference (Signed)
Inpatient RehabilitationTeam Conference and Plan of Care Update Date: 07/31/2022   Time: 11:30 AM    Patient Name: [name]      Medical Record Number: [id number]  Date of Birth: 1950-10-07 Sex: Male         Room/Bed: 4W25C/4W25C-01 Payor Info: Payor: MEDICARE / Plan: MEDICARE PART A AND B / Product Type: *No Product type* /    Admit Date/Time:  07/29/2022  6:43 PM  Primary Diagnosis:  Right below-knee amputee  Hospital Problems: Principal Problem:   Right below-knee amputee    Expected Discharge Date: Expected Discharge Date: 08/11/22  Team Members Present: Physician leading conference: Dr. Sula Soda Social Worker Present: Lavera Guise, BSW Nurse Present: Chana Bode, RN PT Present: Raechel Chute, PT OT Present: Candee Furbish, OT PPS Coordinator present : Fae Pippin, SLP     Current Status/Progress Goal Weekly Team Focus  Bowel/Bladder   Pt is continent of bowel/bladder   Pt will remain continent of bowel/bladder   Will assess qshift and PRN    Swallow/Nutrition/ Hydration   BSS eval pending           ADL's   Max to Total A for all ADLs   Min A ADLs, supervision transfers   ADL retraining, balance, sit > stands, DC planning, endurance training    Mobility   sit<>stands with RW max A, sit<>stands in stedy min A, WC mobility 172ft supervision   min A, mod I WC mobility  functional mobility/transfers, generalized strengthening and endurance, dynamic standing balance/coordinaiton, limb loss education    Communication                Safety/Cognition/ Behavioral Observations               Pain   Pt c/o phantom pain in right leg   Pt's pain is controlled with medication   Will assess qshift and PRN    Skin   Pt's BKA is healing   Pt's BKA will continue to heal  Will assess qshift and PRN      Discharge Planning:  Discharging home with spouse, requesting [facility] for a companion, sw will provie HC resources. Patient has: RE, rollator,  WC, BSC, Shower Seat and SPC. Family able to provide 24/7.   Team Discussion: Patient post right BKA with pain, itching. Ill fitting prothesis from previous amputation. Progress limited by eccentric behaviors, pain and anxiety.  Patient on target to meet rehab goals: yes, currently needs max - total assist for ADLs.  Needs mod assist for squat pivot transfers. Completes sit - stand from an elevated position with mod - max assist. Able to ambulate up to 150' with supervision with extra time.  Goals for discharge set for min assist overall.  *See Care Plan and progress notes for long and short-term goals.   Revisions to Treatment Plan:  [facility] referral for prosthetic strap replacement SLP eval and treat for swallowing issues post intubation Modafinil trial and Cymbalta for neuropathic pain   Teaching Needs: Safety, incision care, medications, transfers, toileting, etc.   Current Barriers to Discharge: Decreased caregiver support, Hemodialysis, and Weight bearing restrictions  Possible Resolutions to Barriers: Family education HH follow up services     Medical Summary Current Status: phantom limb pain, overweight, hypertension, bradycardia. impaired cognition, prosthesis straps not functioning, impaired cognition/fatigue  Barriers to Discharge: Medical stability  Barriers to Discharge Comments: phantom limb pain, overweight, hypertension, bradycardia. impaired cognition, prosthesis straps not functioning, impaired cognition/fatigue Possible Resolutions to  Barriers/Weekly Focus: add cymbalta  daily, discussed gabapentin but he has tried in the past without effect, conitnue to monito BP TID, increase modafinil to  daily, [facility] consulted   Continued Need for Acute Rehabilitation Level of Care: The patient requires daily medical management by a physician with specialized training in physical medicine and rehabilitation for the following reasons: Direction of a  multidisciplinary physical rehabilitation program to maximize functional independence : Yes Medical management of patient stability for increased activity during participation in an intensive rehabilitation regime.: Yes Analysis of laboratory values and/or radiology reports with any subsequent need for medication adjustment and/or medical intervention. : Yes   I attest that I was present, lead the team conference, and concur with the assessment and plan of the team.   Chana Bode B 07/31/2022, 1:43 PM

## 2022-07-31 NOTE — Progress Notes (Signed)
Occupational Therapy Session Note  Patient Details  Name: [name] MRN: [id number] Date of Birth: [birth year]  Today's Date: 07/31/2022 OT Individual Time: 4098-1191 OT Individual Time Calculation (min): 70 min    Short Term Goals: Week 1:  OT Short Term Goal 1 (Week 1): Patient will complete stand pivot or squat pivot transfer to toilet/commode with min assist OT Short Term Goal 2 (Week 1): Patient will complete toilet hygiene with mod assist OT Short Term Goal 3 (Week 1): Patient will dress lower body with mod assist OT Short Term Goal 4 (Week 1): Patient will don/doff Leff prostheses with set up assistance in preparation for functional transfers OT Short Term Goal 5 (Week 1): Patient will don pull over shirt without physical assistance  Skilled Therapeutic Interventions/Progress Updates:  Skilled OT intervention completed with focus on toileting needs/education, sit > stands, w/c mobility, limb guard education. Pt received seated in w/c, agreeable to session. Abdomen discomfort reported due to need for BM, however no pain reported.  Discussed trying squat pivot to [facility] over toilet vs stedy use, however pt's personal w/c without lifting arm rests and pt stating he can't pivot on his prosthesis therefore deferred transfer training to different time when urgency/discomfort isn't a factor. Sit > stand using stedy with light min A (LLE prosthesis already on). Dependent transfer into bathroom with supervision for sitting balance. CGA sit > stand from perched position on stedy then very poor descent/control to commode requiring mod A to lower.   Pt required extended time for trial at [facility] however only a smear noted. Stood with min A using stedy, with pt asking OT to wipe him, however encouraged pt to try and was able to do so with min/mod A for standing balance. Total A to donn clothing over hips. Dependent transfer back to w/c.   Pt self-propelled in w/c about 150 ft with supervision to main gym.  [facility] rep present to deliver limb guard for R residual limb. OT provided education on donning/doffing techniques. With mod cues, pt was able to donn with supervision with increased time. Discussed purpose of wearing it for limb protection as well as at home for same reason with pt reporting he had a fall post discharge last year and is receptive to wearing it when home. Self-propelled in w/c about 80 ft with supervision back to room until onset of fatigue, with dependent transport back to room rest of way.   Pt remained seated in w/c, with belt alarm on/activated, and with all needs in reach at end of session.   Therapy Documentation Precautions:  Precautions Precautions: Fall, Other (comment) Precaution Comments: R LE wound vac Restrictions Weight Bearing Restrictions: Yes RLE Weight Bearing: Non weight bearing Other Position/Activity Restrictions: RLE    Therapy/Group: Individual Therapy  Melvyn Novas, MS, OTR/L  07/31/2022, 3:30 PM

## 2022-07-31 NOTE — Progress Notes (Signed)
Physical Therapy Session Note  Patient Details  Name: [name] MRN: [id number] Date of Birth: 29-May-1950  Today's Date: 07/31/2022 PT Individual Time: 0900-1013 PT Individual Time Calculation (min): 73 min   Short Term Goals: Week 1:  PT Short Term Goal 1 (Week 1): Pt will completed sit to stand from [facility] with ModA + LRAD PT Short Term Goal 2 (Week 1): Pt will maintain static standing balance with CGA + LRAD PT Short Term Goal 3 (Week 1): Pt will initiate SPT practice with LRAD  Skilled Therapeutic Interventions/Progress Updates:   Received pt sitting in [facility] with RN present at bedside administering medication. Pt agreeable to PT treatment and denied any pain during session but reported spontaneous phantom limb pain spasms and burning in his eyes. MD arrived for morning rounds. Session with emphasis on functional mobility/transfers, generalized strengthening and endurance, and dynamic standing balance/coordination. Doffed dirty shirt and donned clean pull over shirt with supervision. Built up [employer] with coband and towel to support R residual limb and fixed top strap on L prosthetic to fit properly. Pt reports there is one broken piece of prosthetic - contacted [facility] to come look at prosthetic. No limb guard foud in room, however pt reports Dr. Lajoyce Corners told him he doesn't need to wear it.  Pt then performed WC mobility 174ft using BUE and supervision with increased time to main therapy gym - emphasis on UE strength. Pt transferred WC<>mat squat<>pivot to L with mod A to clear buttocks from hitting WC armrest. Elevated mat and pt required multiple attempts and max A fading to mod A to stand with RW from elevated EOM x 3 trials - cues for hand placement on mat and RW handgrip. Pt limited by sudden, intense increase in phantom limb pain but ultimately requesting to stay away from narcotics - MD notified. Transported back to room in [facility] dependently and performed seated knee extensions 1x10 bilaterally  with emphasis on knee extension ROM and contracture prevention. Concluded session with pt sitting in [facility] with all needs within reach, awaiting SLP session.   Therapy Documentation Precautions:  Precautions Precautions: Fall, Other (comment) Precaution Comments: R LE wound vac Restrictions Weight Bearing Restrictions: Yes RLE Weight Bearing: Non weight bearing Other Position/Activity Restrictions: RLE  Therapy/Group: Individual Therapy Martin Majestic PT, DPT  07/31/2022, 7:15 AM

## 2022-07-31 NOTE — Progress Notes (Signed)
PROGRESS NOTE   Subjective/Complaints: C/o dry eyes- drops ordered C/o some phantom limb pain, not much residual limb pain. Feels that oxy is causing itchiness  ROS: denies much residual limb pain, +phantom limb pain  Objective:   No results found. Recent Labs    07/29/22 2058 07/30/22 1450  WBC 8.5 6.6  HGB 9.7* 9.3*  HCT 31.4* 29.7*  PLT 171 167   Recent Labs    07/29/22 2058 07/30/22 1450  NA  --  134*  K  --  3.9  CL  --  94*  CO2  --  24  GLUCOSE  --  157*  BUN  --  62*  CREATININE 8.42* 9.13*  CALCIUM  --  8.4*    Intake/Output Summary (Last 24 hours) at 07/31/2022 0934 Last data filed at 07/31/2022 0831 Gross per 24 hour  Intake 360 ml  Output 2000 ml  Net -1640 ml        Physical Exam: Vital Signs Blood pressure (!) 171/72, pulse 84, temperature 98.4 F (36.9 C), resp. rate 17, height  (1.803 m), weight 86.5 kg, SpO2 99 %. Skin:    Comments: Wound VAC in place to right BKA.  Neurological:     Comments: Patient is alert and makes eye contact with examiner.  Provides his name and age.  He was limited medical historian.  Complains of right lower extremity pain. Trouble swallowing pills     General: No acute distress Mood and affect are appropriate Heart: Regular rate and rhythm no rubs murmurs or extra sounds Lungs: Clear to auscultation, breathing unlabored, no rales or wheezes Abdomen: Positive bowel sounds, soft nontender to palpation, nondistended Extremities: No clubbing, cyanosis, or edema Skin: No evidence of breakdown, no evidence of rash Neurologic: Cranial nerves II through XII intact, motor strength is 4/5 in bilateral deltoid, bicep, tricep, grip, hip flexor, L knee extensors, RIght knee ext not tested due to Wound vac, bilateral distal testing deferred due to bilateral BKA Sensory exam normal sensation to light touch except for left little finger sensation in bilateral upper   Musculoskeletal: Full range of motion bilateral upper extremities bilateral hand intrinsic minus deformities. Right BKA with wound VAC limiting examination, left knee range of motion appears to be within functional limits wearing left BKA prosthetic Straps on prosthesis coming off     Assessment/Plan: 1. Functional deficits which require 3+ hours per day of interdisciplinary therapy in a comprehensive inpatient rehab setting. Physiatrist is providing close team supervision and 24 hour management of active medical problems listed below. Physiatrist and rehab team continue to assess barriers to discharge/monitor patient progress toward functional and medical goals  Care Tool:  Bathing    Body parts bathed by patient: Face, Right arm, Left arm, Chest, Abdomen, Right upper leg, Left upper leg   Body parts bathed by helper: Buttocks, Front perineal area Body parts n/a: Right lower leg, Left lower leg   Bathing assist Assist Level: Moderate Assistance - Patient 50 - 74%     Upper Body Dressing/Undressing Upper body dressing   What is the patient wearing?: Pull over shirt    Upper body assist Assist Level: Minimal Assistance -  Patient > 75%    Lower Body Dressing/Undressing Lower body dressing      What is the patient wearing?: Underwear/pull up, Pants     Lower body assist Assist for lower body dressing: Total Assistance - Patient < 25%     Toileting Toileting    Toileting assist Assist for toileting: Total Assistance - Patient < 25%     Transfers Chair/bed transfer  Transfers assist     Chair/bed transfer assist level: Dependent - mechanical lift     Locomotion Ambulation   Ambulation assist   Ambulation activity did not occur: Safety/medical concerns (2/2 pt fatigue and LLE prosthetic stability)          Walk 10 feet activity   Assist  Walk 10 feet activity did not occur: Safety/medical concerns (2/2 pt fatigue and LLE prosthetic stability)         Walk 50 feet activity   Assist Walk 50 feet with 2 turns activity did not occur: Safety/medical concerns (2/2 pt fatigue and LLE prosthetic stability)         Walk 150 feet activity   Assist Walk 150 feet activity did not occur: Safety/medical concerns (2/2 pt fatigue and LLE prosthetic stability)         Walk 10 feet on uneven surface  activity   Assist Walk 10 feet on uneven surfaces activity did not occur: Safety/medical concerns (2/2 pt fatigue and LLE prosthetic stability)         Wheelchair     Assist Is the patient using a wheelchair?: Yes Type of Wheelchair: Manual    Wheelchair assist level: Supervision/Verbal cueing Max wheelchair distance: 150    Wheelchair 50 feet with 2 turns activity    Assist        Assist Level: Supervision/Verbal cueing   Wheelchair 150 feet activity     Assist      Assist Level: Supervision/Verbal cueing   Blood pressure (!) 171/72, pulse 84, temperature 98.4 F (36.9 C), resp. rate 17, height 5\' 11"  (1.803 m), weight 86.5 kg, SpO2 99 %.  Medical Problem List and Plan: 1. Functional deficits secondary to right BKA secondary to abscess 07/26/2022 with wound VAC per Dr. Lajoyce Corners as well as history of left BKA 05/09/2021.  Patient does have a prosthesis             -patient may  shower with ext wrapped once wound vac is removed              -ELOS/Goals: 12-14d, Sup/minA level goals  2.  Antithrombotics: -DVT/anticoagulation:  Pharmaceutical: Heparin             -antiplatelet therapy: N/A 3. Pain Management: Oxycodone as needed 4. Mood/Behavior/Sleep: Trazodone nightly as needed             -antipsychotic agents: N/A 5. Neuropsych/cognition: This patient is capable of making decisions on his own behalf. 6. Skin/Wound Care: Routine skin checks and left BKA wound VAC may be removed 08/02/2022 7. Fluids/Electrolytes/Nutrition: Routine in and outs with follow-up chemistries 8.  End-stage renal disease.  Continue  hemodialysis as directed 9.  Diabetes mellitus.  Latest hemoglobin A1c  5.0.  SSI. Provided list of foods for good blood pressure control 10.  Acute on chronic anemia.  Continue Aranesp 11.  Hypothyroidism.  Synthroid 12.  Hyperlipidemia.  Crestor 13.  CAD/CABG 2008 as well as history of atrial flutter with ablation 04/06/2020.  Follow-up outpatient with Dr. Ladona Ridgel 14.  History of prostate cancer TURP.  Follow-up outpatient 15.  Hypertension.  Monitor with increased mobility.  Patient had been on Coreg 12.5 mg twice daily, Cozaar 50 mg twice daily prior to admission on hold due to blood pressure being soft.  Resume as needed 16. Difficulty swallowing pills: SLP consulted, vitamin C supplement d/ced since it is too large to swallow 17. Fatigue: increase modafinil to  daily 18. Straps on prosthesis coming off: contacted [employer] to adjust straps 19. Dry eyes: eye drops started    LOS: 2 days A FACE TO FACE EVALUATION WAS PERFORMED  Drema Pry Kristan Votta 07/31/2022, 9:34 AM

## 2022-07-31 NOTE — Evaluation (Signed)
Speech Language Pathology Assessment and Plan  Patient Details  Name: [name] MRN: [id number] Date of Birth: Jun 29, 1950  SLP Diagnosis: N/A Rehab Potential: N/A ELOS: N/A   Today's Date: 07/31/2022 SLP Individual Time: 7829-5621 SLP Individual Time Calculation (min): 30 min   Hospital Problem: Principal Problem:   Right below-knee amputee  Past Medical History:  Past Medical History:  Diagnosis Date   Acute osteomyelitis of metatarsal bone of left foot    Ambulates with cane    straight cane   Anemia    Cervical myelopathy 02/06/2018   Chronic kidney disease    dailysis [facility]- home   Community acquired pneumonia of left lower lobe of lung 08/18/2021   Complication of anesthesia    Coronary artery disease    Dehiscence of amputation stump of left lower extremity    Diabetes mellitus without complication    type2   Diabetic foot ulcer    Diabetic neuropathy 02/06/2018   Dysrhythmia    Gait abnormality 08/24/2018   GERD (gastroesophageal reflux disease)     01/06/20- not current   Gout    History of blood transfusion    History of blood transfusion    History of kidney stones    passed stones   Hypercholesteremia    Hypertension    Hypothyroidism    Neuromuscular disorder    neuropathy left leg and bilateral feet   Neuropathy    Partial nontraumatic amputation of foot, 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   PONV (postoperative nausea and vomiting)    Prostate cancer    PVD (peripheral vascular disease)    with amputations   Past Surgical History:  Past Surgical History:  Procedure Laterality Date   A-FLUTTER ABLATION N/A 04/06/2020   Procedure: A-FLUTTER ABLATION;  Surgeon: Marinus Maw, MD;  Location: [facility] INVASIVE CV LAB;  Service: Cardiovascular;  Laterality: N/A;   AMPUTATION Left 12/25/2013   Procedure: AMPUTATION RAY LEFT 5TH RAY;  Surgeon: Nadara Mustard, MD;  Location: [facility] ORS;  Service: Orthopedics;  Laterality: Left;   AMPUTATION Right 12/15/2018   Procedure:  AMPUTATION OF 4TH AND 5TH TOES RIGHT FOOT;  Surgeon: Nadara Mustard, MD;  Location: [facility] OR;  Service: Orthopedics;  Laterality: Right;   AMPUTATION Left 02/02/2021   Procedure: LEFT FOOT 4TH RAY AMPUTATION;  Surgeon: Nadara Mustard, MD;  Location: [facility] OR;  Service: Orthopedics;  Laterality: Left;   AMPUTATION Left 05/09/2021   Procedure: LEFT BELOW KNEE AMPUTATION;  Surgeon: Nadara Mustard, MD;  Location: [facility] OR;  Service: Orthopedics;  Laterality: Left;   AMPUTATION Right 07/26/2022   Procedure: RIGHT BELOW KNEE AMPUTATION;  Surgeon: Nadara Mustard, MD;  Location: [facility] OR;  Service: Orthopedics;  Laterality: Right;   APPLICATION OF WOUND VAC Right 12/15/2018   Procedure: APPLICATION OF WOUND VAC;  Surgeon: Nadara Mustard, MD;  Location: [facility] OR;  Service: Orthopedics;  Laterality: Right;   AV FISTULA PLACEMENT Left 03/25/2019   Procedure: LEFT ARM ARTERIOVENOUS (AV) FISTULA CREATION;  Surgeon: Nada Libman, MD;  Location: [facility] OR;  Service: Vascular;  Laterality: Left;   BACK SURGERY     BASCILIC VEIN TRANSPOSITION Left 05/20/2019   Procedure: SECOND STAGE LEFT BASCILIC VEIN TRANSPOSITION;  Surgeon: Nada Libman, MD;  Location: [facility] OR;  Service: Vascular;  Laterality: Left;   BIOPSY  06/22/2022   Procedure: BIOPSY;  Surgeon: Lemar Lofty., MD;  Location: [facility] ENDOSCOPY;  Service: Gastroenterology;;   CARDIAC CATHETERIZATION  02/17/2014   CHOLECYSTECTOMY  COLONOSCOPY  2011   in [place], Normal   COLONOSCOPY N/A 06/23/2022   Procedure: COLONOSCOPY;  Surgeon: Sherrilyn Rist, MD;  Location: [facility] ENDOSCOPY;  Service: Gastroenterology;  Laterality: N/A;   CORONARY ARTERY BYPASS GRAFT  2008   CYSTOSCOPY N/A 06/29/2020   Procedure: CYSTOSCOPY WITH CLOT EVACUATION AND  FULGERATION;  Surgeon: Marcine Matar, MD;  Location: [facility] OR;  Service: Urology;  Laterality: N/A;   CYSTOSCOPY WITH FULGERATION Bilateral 06/17/2020   Procedure: CYSTOSCOPY,BILATERAL RETROGRADE, CLOT EVACUATION WITH  FULGERATION OF THE BLADDER;  Surgeon: Jannifer Hick, MD;  Location: [facility] OR;  Service: Urology;  Laterality: Bilateral;   CYSTOSCOPY WITH FULGERATION N/A 06/28/2020   Procedure: CYSTOSCOPY WITH CLOT EVACUATION AND FULGERATION OF BLEEDERS;  Surgeon: Marcine Matar, MD;  Location: [facility] OR;  Service: Urology;  Laterality: N/A;  1 HR   ENTEROSCOPY N/A 06/22/2022   Procedure: ENTEROSCOPY;  Surgeon: Meridee Score Netty Starring., MD;  Location: [facility] ENDOSCOPY;  Service: Gastroenterology;  Laterality: N/A;   HEMOSTASIS CLIP PLACEMENT  06/22/2022   Procedure: HEMOSTASIS CLIP PLACEMENT;  Surgeon: Lemar Lofty., MD;  Location: [facility] ENDOSCOPY;  Service: Gastroenterology;;   HOT HEMOSTASIS N/A 06/22/2022   Procedure: HOT HEMOSTASIS (ARGON PLASMA COAGULATION/BICAP);  Surgeon: Lemar Lofty., MD;  Location: [facility] ENDOSCOPY;  Service: Gastroenterology;  Laterality: N/A;   I & D EXTREMITY Right 12/15/2018   Procedure: DEBRIDEMENT RIGHT FOOT;  Surgeon: Nadara Mustard, MD;  Location: [facility] OR;  Service: Orthopedics;  Laterality: Right;   I & D EXTREMITY Right 08/27/2019   Procedure: PARTIAL CUBOID EXCISION RIGHT FOOT;  Surgeon: Nadara Mustard, MD;  Location: [facility] OR;  Service: Orthopedics;  Laterality: Right;   I & D EXTREMITY Right 01/07/2020   Procedure: RIGHT FOOT EXCISION INFECTED BONE;  Surgeon: Nadara Mustard, MD;  Location: [name] OR;  Service: Orthopedics;  Laterality: Right;   I & D EXTREMITY Right 05/17/2022   Procedure: IRRIGATION AND DEBRIDEMENT RIGHT FOOT ABSCESS;  Surgeon: Nadara Mustard, MD;  Location: [facility] OR;  Service: Orthopedics;  Laterality: Right;   NECK SURGERY     novemver 2019   POLYPECTOMY  06/22/2022   Procedure: POLYPECTOMY;  Surgeon: Mansouraty, Netty Starring., MD;  Location: [facility] ENDOSCOPY;  Service: Gastroenterology;;   POLYPECTOMY  06/23/2022   Procedure: POLYPECTOMY;  Surgeon: Sherrilyn Rist, MD;  Location: [facility] ENDOSCOPY;  Service: Gastroenterology;;   STUMP REVISION Left 06/27/2021   Procedure:  REVISION LEFT BELOW KNEE AMPUTATION;  Surgeon: Nadara Mustard, MD;  Location: [facility] OR;  Service: Orthopedics;  Laterality: Left;   SUBMUCOSAL LIFTING INJECTION  06/22/2022   Procedure: SUBMUCOSAL LIFTING INJECTION;  Surgeon: Meridee Score Netty Starring., MD;  Location: [facility] ENDOSCOPY;  Service: Gastroenterology;;   SUBMUCOSAL TATTOO INJECTION  06/22/2022   Procedure: SUBMUCOSAL TATTOO INJECTION;  Surgeon: Lemar Lofty., MD;  Location: [facility] ENDOSCOPY;  Service: Gastroenterology;;   TRANSURETHRAL RESECTION OF PROSTATE N/A 06/28/2020   Procedure: TRANSURETHRAL RESECTION OF THE PROSTATE (TURP);  Surgeon: Marcine Matar, MD;  Location: [facility] OR;  Service: Urology;  Laterality: N/A;   WISDOM TOOTH EXTRACTION      Assessment / Plan / Recommendation Clinical Impression Patient is a 72 year old right-handed male well-known to rehab services from left BKA receiving CIR 05/11/2021 - 05/24/2021 as well as history significant for hypertension, hyperlipidemia, CAD/CABG 2008, atrial flutter with ablation 04/06/2020, diabetes mellitus, peripheral vascular disease, prostate cancer with TURP, quit smoking 33 years ago, end-stage renal disease with hemodialysis and chronic anemia.   Presented 07/22/2022 with  altered mental status x 2 days low-grade fever and hypoxia as well as chronic right foot wound.  He required BiPAP on presentation with initial improvement but later his mental status declined and deteriorated subsequently required intubation and was extubated 07/23/2022.  Dr. Lajoyce Corners consulted in regards to abscess of right foot with x-ray showing subcutaneous gas within the soft tissues lateral and plantar to the fifth tarsometatarsal joint.  No superimposed osseous erosion identified to suggest osteomyelitis.  Limb was not felt to be salvageable underwent right BKA 07/26/2022 per Dr. Lajoyce Corners and wound VAC was applied postoperatively.   Therapy evaluations completed due to patient decreased functional mobility was admitted for a  comprehensive rehab program 07/29/22.  Upon arrival, patient was awake and alert while upright in the wheelchair. Patient reports difficulty swallowing characterized by foods and liquids "feeling stuck." Patient reports he has been able to eat very little due to difficulty as well as difficulty with taking medications whole with thin. Patient observed with thin liquids via straw and pureed textures. Patient grimacing while swallowing and appeared to utilize an effortful swallow with all trials. Patient demonstrated what appeared to be swift swallow response without overt s/s of aspiration observed. Patient declined trials of solid textures due to fear of chocking. SLP provided education regarding general aspiration precautions, potential diet modifications, and/or potential modifications for medication administration. SLP also discussed an MBS to objectively assess swallow function. Patient verbalized understanding of all information but declined any and all interventions/recommendations at this time. Patient reported that "this has happened before and felt confident it would clear up on its own." Patient also reported that he will request another SLP consult in ~1 week if symptoms do not subside and would be agreeable to an MBS at that time. Therefore, recommend patient continue current diet with general aspiration precautions.  Of note, patient observed to be eating pizza at lunch. Physician and nurse aware.     Skilled Therapeutic Interventions          Administered a BSE, please see above for details.   SLP Assessment  Patient does not need any further Speech Lanaguage Pathology Services    Recommendations  SLP Diet Recommendations: Age appropriate regular solids;Thin Liquid Administration via: Straw Medication Administration: Whole meds with liquid Supervision: Patient able to self feed Compensations: Minimize environmental distractions;Slow rate;Small sips/bites;Multiple dry swallows after each  bite/sip;Follow solids with liquid Postural Changes and/or Swallow Maneuvers: Seated upright 90 degrees;Upright 30-60 min after meal Oral Care Recommendations: Oral care BID Recommendations for Other Services: Neuropsych consult Patient destination: Home Follow up Recommendations: None Equipment Recommended: None recommended by SLP    SLP Frequency N/A  SLP Duration  SLP Intensity  SLP Treatment/Interventions N/A  N/A  N/A   Pain Phantom limb pain  Nursing made aware and pain medication requested   SLP Evaluation Cognition Overall Cognitive Status: Within Functional Limits for tasks assessed Arousal/Alertness: Awake/alert Orientation Level: Oriented X4 Focused Attention: Appears intact Memory: Appears intact Awareness: Appears intact Problem Solving: Appears intact Organizing: Appears intact Initiating: Appears intact Safety/Judgment: Appears intact  Comprehension Auditory Comprehension Overall Auditory Comprehension: Appears within functional limits for tasks assessed Expression Expression Primary Mode of Expression: Verbal Verbal Expression Overall Verbal Expression: Appears within functional limits for tasks assessed Oral Motor Oral Motor/Sensory Function Overall Oral Motor/Sensory Function: Within functional limits Motor Speech Overall Motor Speech: Impaired Respiration: Within functional limits Phonation: Hoarse Resonance: Within functional limits Articulation: Within functional limitis Intelligibility: Intelligible Motor Planning: Witnin functional limits  Care Tool Care  Tool Cognition Ability to hear (with hearing aid or hearing appliances if normally used Ability to hear (with hearing aid or hearing appliances if normally used): 0. Adequate - no difficulty in normal conservation, social interaction, listening to TV   Expression of Ideas and Wants Expression of Ideas and Wants: 3. Some difficulty - exhibits some difficulty with expressing needs and  ideas (e.g, some words or finishing thoughts) or speech is not clear   Understanding Verbal and Non-Verbal Content Understanding Verbal and Non-Verbal Content: 3. Usually understands - understands most conversations, but misses some part/intent of message. Requires cues at times to understand  Memory/Recall Ability Memory/Recall Ability : That he or she is in a hospital/hospital unit    Bedside Swallowing Assessment General Date of Onset: 07/22/22 Previous Swallow Assessment: N/A Diet Prior to this Study: Regular;Thin liquids (Level 0) Temperature Spikes Noted: No Respiratory Status: Room air Oral Cavity - Dentition: Adequate natural dentition Self-Feeding Abilities: Able to feed self Patient Positioning: Upright in chair/Tumbleform Baseline Vocal Quality: Hoarse Volitional Cough: Strong Volitional Swallow: Able to elicit  Ice Chips Ice chips: Within functional limits Thin Liquid Thin Liquid: Within functional limits Presentation: Self Fed;Straw Nectar Thick Nectar Thick Liquid: Not tested Honey Thick Honey Thick Liquid: Not tested Puree Puree: Within functional limits Presentation: Self Fed;Spoon Solid Solid: Not tested BSE Assessment Risk for Aspiration Impact on safety and function: Mild aspiration risk  Short Term Goals: N/A   Refer to Care Plan for Long Term Goals  Recommendations for other services: Neuropsych  Discharge Criteria: Patient will be discharged from SLP if patient refuses treatment 3 consecutive times without medical reason, if treatment goals not met, if there is a change in medical status, if patient makes no progress towards goals or if patient is discharged from hospital.  The above assessment, treatment plan, treatment alternatives and goals were discussed and mutually agreed upon: by patient  Tangie Stay 07/31/2022, 12:50 PM

## 2022-07-31 NOTE — Progress Notes (Signed)
Subjective:   HD yest -  removed 2000-  tolerated well -  comments on his lower time- do that for high inpt census  Objective Vital signs in last 24 hours: Vitals:   07/30/22 1848 07/30/22 2014 07/31/22 0500 07/31/22 0511  BP:  (!) 168/71  (!) 171/72  Pulse:  68  84  Resp:  16  17  Temp:  98.3 F (36.8 C)  98.4 F (36.9 C)  TempSrc:  Oral    SpO2:  100%  99%  Weight: 86.4 kg  86.5 kg   Height:       Weight change: 2.6 kg  Physical Exam: General: Alert chronically ill-appearing male NAD Heart: RRR no MRG Lungs: CTA bilaterally nonlabored breathing Abdomen: NABS soft NTND Extremities: Right BKA dressing dry clean /left BKA, with prosthesis on Dialysis Access: LUA AV fistula+  bruit  Dialysis Orders: [facility] (Dr. Signe Colt follows)- Sun - Wed - Fri 3:48hr, BFR 425, EDW 88kg, L AVF, no heparin - Venofer  IV weekly.    Problem/Plan:  1. RLE cellulitis: Status post right BKA 4/19 Dr. Lajoyce Corners, had wound VAC, antibiotics complete/CIR admit  2. Acute Hypoxic Resp Failure: Admit intubated / extubated 4/16 /stable now on room air.  UF as tolerated 3. ESRD: [facility] / in hosp TTS schedule while here - Next HD 4/25 via AVF. 4. HTN/volume: BP stable on HD, variable bed weights - 1L UFG. 5. Anemia: Hgb now 9.8 - s/p 2 units PRBCs 4/18. Aranesp given 4/18. Stable in the 9's 6. Secondary hyperparathyroidism:  CorrCa/Phos 4.2 -on Renvela as outpatient no current binder for good phos /no vitamin D as OP-  will check labs with HD 7. Nutrition: ALB 2.9 /on protein supp  8. T2DM 9. CAD   Annie Sable, MD 07/31/2022    Labs: Basic Metabolic Panel: Recent Labs  Lab 07/25/22 0218 07/26/22 1018 07/27/22 0801 07/29/22 2058 07/30/22 1450  NA 135 135 132*  --  134*  K 3.5 3.7 3.8  --  3.9  CL 96* 95* 94*  --  94*  CO2 27  --  24  --  24  GLUCOSE 182* 105* 160*  --  157*  BUN 50* 32* 49*  --  62*  CREATININE 7.95* 6.50* 7.54* 8.42* 9.13*  CALCIUM 7.9*  --  7.9*  --  8.4*  PHOS   --   --  4.2  --  3.9   Liver Function Tests: Recent Labs  Lab 07/27/22 0801 07/30/22 1450  ALBUMIN 2.9* 2.9*   No results for input(s): "LIPASE", "AMYLASE" in the last 168 hours.  No results for input(s): "AMMONIA" in the last 168 hours. CBC: Recent Labs  Lab 07/25/22 0218 07/25/22 1359 07/27/22 0801 07/29/22 2058 07/30/22 1450  WBC 5.5  --  7.9 8.5 6.6  NEUTROABS 4.5  --   --   --   --   HGB 7.8*   < > 9.8* 9.7* 9.3*  HCT 23.5*   < > 29.3* 31.4* 29.7*  MCV 90.4  --  89.1 93.2 93.1  PLT 91*  --  126* 171 167   < > = values in this interval not displayed.   Cardiac Enzymes: No results for input(s): "CKTOTAL", "CKMB", "CKMBINDEX", "TROPONINI" in the last 168 hours. CBG: Recent Labs  Lab 07/29/22 2120 07/30/22 0615 07/30/22 1209 07/30/22 2059 07/31/22 0631  GLUCAP 114* 121* 135* 103* 109*    Studies/Results: No results found. Medications:    carvedilol  12.5 mg Oral  BID WC   Chlorhexidine Gluconate Cloth  6 each Topical Q0600   [START ON 08/01/2022] darbepoetin (ARANESP) injection - DIALYSIS  100 mcg Subcutaneous Q Thu-1800   DULoxetine  20 mg Oral Daily   feeding supplement (NEPRO CARB STEADY)  237 mL Oral TID BM   heparin  5,000 Units Subcutaneous Q8H   insulin aspart  0-9 Units Subcutaneous TID WC   levothyroxine  112 mcg Oral Q0600   [START ON 08/01/2022] modafinil  200 mg Oral Daily   pantoprazole  40 mg Oral QHS   rosuvastatin  10 mg Oral Daily   zinc sulfate  220 mg Oral Daily

## 2022-08-01 DIAGNOSIS — Z89511 Acquired absence of right leg below knee: Secondary | ICD-10-CM | POA: Diagnosis not present

## 2022-08-01 LAB — GLUCOSE, CAPILLARY
Glucose-Capillary: 100 mg/dL — ABNORMAL HIGH (ref 70–99)
Glucose-Capillary: 109 mg/dL — ABNORMAL HIGH (ref 70–99)
Glucose-Capillary: 86 mg/dL (ref 70–99)
Glucose-Capillary: 97 mg/dL (ref 70–99)

## 2022-08-01 LAB — CBC
HCT: 27.2 % — ABNORMAL LOW (ref 39.0–52.0)
Hemoglobin: 9 g/dL — ABNORMAL LOW (ref 13.0–17.0)
MCH: 29.7 pg (ref 26.0–34.0)
MCHC: 33.1 g/dL (ref 30.0–36.0)
MCV: 89.8 fL (ref 80.0–100.0)
Platelets: 158 10*3/uL (ref 150–400)
RBC: 3.03 MIL/uL — ABNORMAL LOW (ref 4.22–5.81)
RDW: 16.9 % — ABNORMAL HIGH (ref 11.5–15.5)
WBC: 6.9 10*3/uL (ref 4.0–10.5)
nRBC: 0 % (ref 0.0–0.2)

## 2022-08-01 LAB — RENAL FUNCTION PANEL
Albumin: 3 g/dL — ABNORMAL LOW (ref 3.5–5.0)
Anion gap: 13 (ref 5–15)
BUN: 48 mg/dL — ABNORMAL HIGH (ref 8–23)
CO2: 24 mmol/L (ref 22–32)
Calcium: 8.3 mg/dL — ABNORMAL LOW (ref 8.9–10.3)
Chloride: 94 mmol/L — ABNORMAL LOW (ref 98–111)
Creatinine, Ser: 8.04 mg/dL — ABNORMAL HIGH (ref 0.61–1.24)
GFR, Estimated: 7 mL/min — ABNORMAL LOW (ref >60)
Glucose, Bld: 134 mg/dL — ABNORMAL HIGH (ref 70–99)
Phosphorus: 4.4 mg/dL (ref 2.5–4.6)
Potassium: 3.7 mmol/L (ref 3.5–5.1)
Sodium: 131 mmol/L — ABNORMAL LOW (ref 135–145)

## 2022-08-01 MED ORDER — LOPERAMIDE HCL 2 MG PO CAPS
2.0000 mg | ORAL_CAPSULE | Freq: Once | ORAL | Status: AC
  Administered 2022-08-01: 2 mg via ORAL
  Filled 2022-08-01: qty 1

## 2022-08-01 MED ORDER — PENTAFLUOROPROP-TETRAFLUOROETH EX AERO
INHALATION_SPRAY | CUTANEOUS | Status: AC
  Filled 2022-08-01: qty 30

## 2022-08-01 MED ORDER — TRAMADOL HCL 50 MG PO TABS
50.0000 mg | ORAL_TABLET | Freq: Once | ORAL | Status: AC
  Administered 2022-08-01: 50 mg via ORAL
  Filled 2022-08-01: qty 1

## 2022-08-01 NOTE — Progress Notes (Signed)
Physical Therapy Session Note  Patient Details  Name: [name] MRN: [id number] Date of Birth: Sep 04, 1950  Today's Date: 08/01/2022 PT Individual Time: 1191-4782 and 1100-1155 PT Individual Time Calculation (min): 68 min  and 55 min  Today's Date: 08/01/2022 PT Missed Time: 7 Minutes Missed Time Reason: Toileting  Short Term Goals: Week 1:  PT Short Term Goal 1 (Week 1): Pt will completed sit to stand from [facility] with ModA + LRAD PT Short Term Goal 2 (Week 1): Pt will maintain static standing balance with CGA + LRAD PT Short Term Goal 3 (Week 1): Pt will initiate SPT practice with LRAD  Skilled Therapeutic Interventions/Progress Updates:   Treatment Session 1 Received pt sitting on commode in [place] with L prosthetic on. Pt agreeable to PT treatment and reported pain in R residual limb was "fine" but reported feeling dehydrated. Session with emphasis on toileting, functional mobility/transfers, generalized strengthening and endurance, and WC mobility. Pt continent of bowel and stood in [place] with min A and dependent for pericare in standing. Pt transferred toilet<>WC in [place] dependently and ate yogurt/drank water while therapist notified RN of beeping wound vac and pt's request for Lyrica. Donned R limb guard with max A and removed L prosthetic without assist ([facility] planning on coming to get prosthetic this morning); therefore plan to practice AP/PA transfers today.    Pt performed WC mobility 140ft using BUE and supervision with increased time to dayroom - emphasis on UE strengthening. Took rest break then set up AP/PA transfer. Pt performed PA transfer from WC<>mat with CGA/light min A and increased time and effort due to need for multiple rest breaks. RN arrived to administer medications while pt worked on dynamic unsupported sitting balance for ~5 minutes with BUE support, fading to 1UE support, then no UE support. Pt then performed AP transfer mat<>WC with supervision and reported urge to have  BM. Transported back to room in [facility] dependently and due to urgency donned L prosthetic with supervision and stood in [place] with min A. Dependent transfer to toilet with bedside commode over top and required max A for clothing management due to urgency - would vac seal became unlocked and RN notified. Pt with continued loose stools - handed off care to NT. 7 minutes missed of skilled physical therapy due to toileting.   Treatment Session 2 Received pt sitting in WC with L prosthetic on - [employer] planning on ordering piece for prosthetic. Pt agreeable to PT treatment and denied any pain at start of session, increasing to 6/10 at end of session. Session with emphasis on functional mobility/transfers and generalized strengthening and endurance. Pt refused limb guard, reporting he "just got his leg to calm down". Pt transported to/from room in [facility] dependently for time management purposes. RN present to reconnect wound vac per MD orders. Pt performed squat<>pivot transfer WC<>mat to L with mod A (required multiple scoots and getting stuck on WC armrest). Pt transferred sit<>supine on wedge with supervision and performed the following exercises with emphasis on LE strength/ROM and contracture prevention.  -SLR 2x8 on L residual limb and 1x12 and 1x10 on R residual limb -hip abduction 2x8 on L residual limb and 2x10 on R residual limb Pt required increased time to complete exercises and moans and groans with exercises. Transferred supine on wedge<>long sitting<>sitting EOM with supervision and increased time scoot to EOM. Donned L prosthetic without assist and transferred mat<>WC squat<>pivot to L with light mod/heavy min A. Returned to room and concluded session with  pt sitting in WC with all needs within reach.   Therapy Documentation Precautions:  Precautions Precautions: Fall, Other (comment) Precaution Comments: R LE wound vac Restrictions Weight Bearing Restrictions: Yes RLE Weight Bearing: Non weight  bearing Other Position/Activity Restrictions: RLE  Therapy/Group: Individual Therapy Martin Majestic PT, DPT  08/01/2022, 6:54 AM

## 2022-08-01 NOTE — IPOC Note (Signed)
Overall Plan of Care [facility]) Patient Details Name: [name] MRN: [id number] DOB: 02-19-1951  Admitting Diagnosis: Right below-knee amputee  Hospital Problems: Principal Problem:   Right below-knee amputee     Functional Problem List: Nursing Medication Management, Endurance, Pain, Safety, Skin Integrity, Bowel  PT Balance, Edema, Endurance, Pain, Safety  OT Balance, Safety, Cognition, Endurance, Skin Integrity, Behavior  SLP    TR         Basic ADL's: OT Bathing, Dressing, Toileting     Advanced  ADL's: OT       Transfers: PT Bed Mobility, Car, Bed to Chair  OT Toilet, Tub/Shower     Locomotion: PT Wheelchair Mobility     Additional Impairments: OT    SLP        TR      Anticipated Outcomes Item Anticipated Outcome  Self Feeding Independent  Swallowing      Basic self-care  Min assist  Toileting  Min assist   Bathroom Transfers Min assist  Bowel/Bladder  manage bowel w mod I  Transfers  minA  Locomotion  modI with WC  Communication     Cognition     Pain  < 4 with prns  Safety/Judgment  manage w cues   Therapy Plan: PT Intensity: Minimum of 1-2 x/day ,45 to 90 minutes PT Frequency: 5 out of 7 days PT Duration Estimated Length of Stay: 12-14 days OT Intensity: Minimum of 1-2 x/day, 45 to 90 minutes OT Frequency: 5 out of 7 days OT Duration/Estimated Length of Stay: 10-12 days     Team Interventions: Nursing Interventions Patient/Family Education, Disease Management/Prevention, Pain Management, Medication Management, Skin Care/Wound Management, Discharge Planning  PT interventions Ambulation/gait training, Balance/vestibular training, Cognitive remediation/compensation, Discharge planning, Disease management/prevention, DME/adaptive equipment instruction, Functional mobility training, Pain management, Patient/family education, Psychosocial support, Therapeutic Activities, Skin care/wound management, Therapeutic Exercise, UE/LE Strength  taining/ROM, UE/LE Coordination activities, Wheelchair propulsion/positioning  OT Interventions Balance/vestibular training, Discharge planning, Pain management, Self Care/advanced ADL retraining, Therapeutic Activities, Cognitive remediation/compensation, Disease mangement/prevention, Functional mobility training, Patient/family education, Skin care/wound managment, Therapeutic Exercise, DME/adaptive equipment instruction, Community reintegration, Psychosocial support, UE/LE Strength taining/ROM, Wheelchair propulsion/positioning  SLP Interventions    TR Interventions    SW/CM Interventions Discharge Planning, Psychosocial Support, Patient/Family Education, Disease Management/Prevention   Barriers to Discharge MD  Medical stability  Nursing Decreased caregiver support, Wound Care 1 level ramped entry w wife; Home HD and has DME  PT Inaccessible home environment, Decreased caregiver support, Weight bearing restrictions difficulty accessing toilet with WC or walker; WC arm does no lift for slide transfer, but needs current WC 2/2 light weight for wife to manage  OT      SLP      SW Decreased caregiver support, Lack of/limited family support     Team Discharge Planning: Destination: PT-Home ,OT- Home , SLP-Home Projected Follow-up: PT-Home health PT, OT-  Home health OT, SLP-None Projected Equipment Needs: PT-To be determined, OT- To be determined, SLP-None recommended by SLP Equipment Details: PT- , OT-Family not here to verify patient's equipment from prior rehab stay Patient/family involved in discharge planning: PT- Patient,  OT-Patient, SLP-Patient  MD ELOS: 12-14 days Medical Rehab Prognosis:  Excellent Assessment: The patient has been admitted for CIR therapies with the diagnosis of right BKA. The team will be addressing functional mobility, strength, stamina, balance, safety, adaptive techniques and equipment, self-care, bowel and bladder mgt, patient and caregiver education. Goals  have been set at supervision/MinA. Anticipated discharge destination is home.  See Team Conference Notes for weekly updates to the plan of care

## 2022-08-01 NOTE — Progress Notes (Signed)
PROGRESS NOTE   Subjective/Complaints: No new complaints this morning Happy that lyrica was restarted and phantom limb pain is well controlled [employer] came by yesterday and will be back today  ROS: denies much residual limb pain, +phantom limb pain- improved  Objective:   No results found. Recent Labs    07/30/22 1450 08/01/22 1438  WBC 6.6 6.9  HGB 9.3* 9.0*  HCT 29.7* 27.2*  PLT 167 158   Recent Labs    07/30/22 1450 08/01/22 1438  NA 134* 131*  K 3.9 3.7  CL 94* 94*  CO2 24 24  GLUCOSE 157* 134*  BUN 62* 48*  CREATININE 9.13* 8.04*  CALCIUM 8.4* 8.3*    Intake/Output Summary (Last 24 hours) at 08/01/2022 1715 Last data filed at 08/01/2022 1031 Gross per 24 hour  Intake 390 ml  Output --  Net 390 ml        Physical Exam: Vital Signs Blood pressure (!) 161/59, pulse 65, temperature 97.7 F (36.5 C), resp. rate (!) 22, height (P) 4\' 5"  (1.346 m), weight 86.5 kg, SpO2 100 %. Skin:    Comments: Wound VAC in place to right BKA. No drainage Neurological:     Comments: Patient is alert and makes eye contact with examiner.  Provides his name and age.  He was limited medical historian.  Complains of right lower extremity pain. Trouble swallowing pills     General: No acute distress Mood and affect are appropriate Heart: Regular rate and rhythm no rubs murmurs or extra sounds Lungs: Clear to auscultation, breathing unlabored, no rales or wheezes Abdomen: Positive bowel sounds, soft nontender to palpation, nondistended Extremities: No clubbing, cyanosis, or edema Skin: No evidence of breakdown, no evidence of rash Neurologic: Cranial nerves II through XII intact, motor strength is 4/5 in bilateral deltoid, bicep, tricep, grip, hip flexor, L knee extensors, RIght knee ext not tested due to Wound vac, bilateral distal testing deferred due to bilateral BKA Sensory exam normal sensation to light touch except for  left little finger sensation in bilateral upper  Musculoskeletal: Full range of motion bilateral upper extremities bilateral hand intrinsic minus deformities. Right BKA with wound VAC limiting examination, left knee range of motion appears to be within functional limits wearing left BKA prosthetic Straps on prosthesis coming off     Assessment/Plan: 1. Functional deficits which require 3+ hours per day of interdisciplinary therapy in a comprehensive inpatient rehab setting. Physiatrist is providing close team supervision and 24 hour management of active medical problems listed below. Physiatrist and rehab team continue to assess barriers to discharge/monitor patient progress toward functional and medical goals  Care Tool:  Bathing    Body parts bathed by patient: Face, Right arm, Left arm, Chest, Abdomen, Right upper leg, Left upper leg   Body parts bathed by helper: Buttocks, Front perineal area Body parts n/a: Right lower leg, Left lower leg   Bathing assist Assist Level: Moderate Assistance - Patient 50 - 74%     Upper Body Dressing/Undressing Upper body dressing   What is the patient wearing?: Pull over shirt    Upper body assist Assist Level: Minimal Assistance - Patient > 75%  Lower Body Dressing/Undressing Lower body dressing      What is the patient wearing?: Underwear/pull up, Pants     Lower body assist Assist for lower body dressing: Total Assistance - Patient < 25%     Toileting Toileting    Toileting assist Assist for toileting: Total Assistance - Patient < 25%     Transfers Chair/bed transfer  Transfers assist     Chair/bed transfer assist level: Minimal Assistance - Patient > 75% (AP/PA)     Locomotion Ambulation   Ambulation assist   Ambulation activity did not occur: Safety/medical concerns (2/2 pt fatigue and LLE prosthetic stability)          Walk 10 feet activity   Assist  Walk 10 feet activity did not occur: Safety/medical  concerns (2/2 pt fatigue and LLE prosthetic stability)        Walk 50 feet activity   Assist Walk 50 feet with 2 turns activity did not occur: Safety/medical concerns (2/2 pt fatigue and LLE prosthetic stability)         Walk 150 feet activity   Assist Walk 150 feet activity did not occur: Safety/medical concerns (2/2 pt fatigue and LLE prosthetic stability)         Walk 10 feet on uneven surface  activity   Assist Walk 10 feet on uneven surfaces activity did not occur: Safety/medical concerns (2/2 pt fatigue and LLE prosthetic stability)         Wheelchair     Assist Is the patient using a wheelchair?: Yes Type of Wheelchair: Manual    Wheelchair assist level: Supervision/Verbal cueing Max wheelchair distance: 150    Wheelchair 50 feet with 2 turns activity    Assist        Assist Level: Supervision/Verbal cueing   Wheelchair 150 feet activity     Assist      Assist Level: Supervision/Verbal cueing   Blood pressure (!) 161/59, pulse 65, temperature 97.7 F (36.5 C), resp. rate (!) 22, height (P)  (1.346 m), weight 86.5 kg, SpO2 100 %.  Medical Problem List and Plan: 1. Functional deficits secondary to right BKA secondary to abscess 07/26/2022 with wound VAC per Dr. Lajoyce Corners as well as history of left BKA 05/09/2021.  Patient does have a prosthesis             -patient may  shower with ext wrapped once wound vac is removed, discussed with ortho whether wound vac can be removed and they said we should wait until tomorrow             -ELOS/Goals: 12-14d, Sup/minA level goals  2.  Antithrombotics: -DVT/anticoagulation:  Pharmaceutical: Heparin             -antiplatelet therapy: N/A 3. Pain Management: Oxycodone as needed 4. Mood/Behavior/Sleep: Trazodone nightly as needed             -antipsychotic agents: N/A 5. Neuropsych/cognition: This patient is capable of making decisions on his own behalf. 6. Skin/Wound Care: Routine skin checks and  left BKA wound VAC may be removed 08/02/2022 7. Fluids/Electrolytes/Nutrition: Routine in and outs with follow-up chemistries 8.  End-stage renal disease.  Continue hemodialysis as directed 9.  Diabetes mellitus.  Latest hemoglobin A1c  5.0.  d/c SSI. Provided list of foods for good blood pressure control 10.  Acute on chronic anemia.  Continue Aranesp 11.  Hypothyroidism.  Synthroid 12.  Hyperlipidemia.  Crestor 13.  CAD/CABG 2008 as well as history of atrial flutter  with ablation 04/06/2020.  Follow-up outpatient with Dr. Ladona Ridgel 14.  History of prostate cancer TURP.  Follow-up outpatient 15.  Hypertension.  Monitor with increased mobility.  Patient had been on Coreg 12.5 mg twice daily, Cozaar 50 mg twice daily prior to admission on hold due to blood pressure being soft.  Resume as needed 16. Difficulty swallowing pills: SLP consulted, vitamin C supplement d/ced since it is too large to swallow 17. Fatigue: increase modafinil to  daily 18. Straps on prosthesis coming off: contacted [employer] to adjust straps, discussed that they evaluated him yesterday and will be back today.  19. Dry eyes: eye drops started 20. Loose stools: imodium ordered  >50 minutes spent in review of chart, examination of patient, discussion of lyrica and current neuropathic pain, discussion of loose stools and dropping Nepro and trying imodium  LOS: 3 days A FACE TO FACE EVALUATION WAS PERFORMED  Clint Bolder P Sherrelle Prochazka 08/01/2022, 5:15 PM

## 2022-08-01 NOTE — Progress Notes (Signed)
Patient picked up by transport for dialysis.

## 2022-08-01 NOTE — Procedures (Signed)
HD Note:  Some information was entered later than the data was gathered due to patient care needs. The stated time with the data is accurate.  Received patient in bed to unit.  Alert and oriented.  Informed consent signed and in chart.   TX duration: 3  Patient tolerated well.  Transported back to the room  Alert, without acute distress.  Hand-off given to patient's nurse.   Access used: Left upper arm Access issues: No issues  Total UF removed: 2500 ml   [facility]

## 2022-08-01 NOTE — Progress Notes (Signed)
Occupational Therapy Session Note  Patient Details  Name: [name] MRN: [id number] Date of Birth: 1950/06/08  Today's Date: 08/01/2022 OT Individual Time: 0920-1015 OT Individual Time Calculation (min): 55 min    Short Term Goals: Week 1:  OT Short Term Goal 1 (Week 1): Patient will complete stand pivot or squat pivot transfer to toilet/commode with min assist OT Short Term Goal 2 (Week 1): Patient will complete toilet hygiene with mod assist OT Short Term Goal 3 (Week 1): Patient will dress lower body with mod assist OT Short Term Goal 4 (Week 1): Patient will don/doff Leff prostheses with set up assistance in preparation for functional transfers OT Short Term Goal 5 (Week 1): Patient will don pull over shirt without physical assistance  Skilled Therapeutic Interventions/Progress Updates:  Skilled OT intervention completed with focus on toilet transfers and education. Pt received seated in w/c with wound care nursing present and MD for rounds, agreeable to session. 5/10 pain reported in R residual limb; nurse notified of pain med request as well as request for imodium to help with loose BM. OT offered rest breaks and repositioning throughout for pain reduction. Pt was largely limited by pain, frustration and fatigue this session with extended rest breaks needed.  Wound vac noted to be disconnect from the machine, as well as vac cannister off of the vac itself; nurse/MD made aware as pt is due to vac removal soon any ways. Per care team- okay to keep disconnected until plan from vascular.   Pt was fixated on when [employer] was coming to pick up his prosthesis with [employer] already verbalizing yesterday the plan to come today. Discussed with pt that if prosthesis is gone for several days, pt will be required to transfer other way than using stedy or squat pivot with nursing for his frequent BM. Advised an A/P and P/A transfer on wide BSC therefore OT retrieved one to trial.   Pt was resistant at  first stating "it's too high" even after OT lowered to lowest level and OT suggested that pt trial even if not completely level as not every surface may be level he'll need to transfer to in his home environment. With extended time and discussion and w/c backed up against wall for safety, pt was able to remove LLE prosthetic independently, then transfer anteriorly to [facility] without back rest with CGA with min cues. Did express discomfort on old L BKA with transfer therefore OT placed towel under limb for skin integrity. Supervision for sitting balance while on commode. CGA for posterior transfer back to w/c, however posterior LOB into w/c requiring mod cues and stabilization of w/c however pt able to reset with only supervision. Safety plan updated for a safe option for toilet transfer if not using stedy.  Re donned prosthesis with set up A. Pt remained seated in w/c, with chair alarm on/activated, and with all needs in reach at end of session.   Therapy Documentation Precautions:  Precautions Precautions: Fall, Other (comment) Precaution Comments: R LE wound vac Restrictions Weight Bearing Restrictions: Yes RLE Weight Bearing: Non weight bearing Other Position/Activity Restrictions: RLE    Therapy/Group: Individual Therapy  [name] E Cheyenne Adas, MS, OTR/L  08/01/2022, 12:12 PM

## 2022-08-01 NOTE — Progress Notes (Signed)
Subjective:   no new issues   Objective Vital signs in last 24 hours: Vitals:   07/31/22 2025 07/31/22 2215 08/01/22 0532 08/01/22 0817  BP: (!) 153/74  (!) 157/59 (!) 154/0  Pulse: 61  67 70  Resp: 16  18   Temp: 97.8 F (36.6 C)  97.7 F (36.5 C)   TempSrc:      SpO2: 100%  100%   Weight:      Height:  (P)  (1.346 m)     Weight change:   Physical Exam: General: Alert chronically ill-appearing male NAD Heart: RRR no MRG Lungs: CTA bilaterally nonlabored breathing Abdomen: NABS soft NTND Extremities: Right BKA dressing dry clean /left BKA, with prosthesis on Dialysis Access: LUA AV fistula+  bruit  Dialysis Orders: [facility] (Dr. Signe Colt follows)- Sun - Wed - Fri 3:48hr, BFR 425, EDW 88kg, L AVF, no heparin - Venofer  IV weekly.    Problem/Plan:  1. RLE cellulitis: Status post right BKA 4/19 Dr. Lajoyce Corners, had wound VAC, antibiotics complete/CIR admit  2. Acute Hypoxic Resp Failure: Admit intubated / extubated 4/16 /stable now on room air.  UF as tolerated 3. ESRD: [facility] / in hosp TTS schedule while here - Next HD 4/25/today via AVF.  Checking labs with HD 4. HTN/volume: BP stable on HD, variable bed weights - 1L UFG. 5. Anemia: Hgb now 9.8 - s/p 2 units PRBCs 4/18. Aranesp given 4/18. Stable in the 9's 6. Secondary hyperparathyroidism:  CorrCa/Phos 4.2 -on Renvela as outpatient no current binder for good phos /no vitamin D as OP-  will check labs with HD 7. Nutrition: ALB 2.9 /on protein supp  8. T2DM 9. CAD   Annie Sable, MD 08/01/2022    Labs: Basic Metabolic Panel: Recent Labs  Lab 07/26/22 1018 07/27/22 0801 07/29/22 2058 07/30/22 1450  NA 135 132*  --  134*  K 3.7 3.8  --  3.9  CL 95* 94*  --  94*  CO2  --  24  --  24  GLUCOSE 105* 160*  --  157*  BUN 32* 49*  --  62*  CREATININE 6.50* 7.54* 8.42* 9.13*  CALCIUM  --  7.9*  --  8.4*  PHOS  --  4.2  --  3.9   Liver Function Tests: Recent Labs  Lab 07/27/22 0801 07/30/22 1450   ALBUMIN 2.9* 2.9*   No results for input(s): "LIPASE", "AMYLASE" in the last 168 hours.  No results for input(s): "AMMONIA" in the last 168 hours. CBC: Recent Labs  Lab 07/27/22 0801 07/29/22 2058 07/30/22 1450  WBC 7.9 8.5 6.6  HGB 9.8* 9.7* 9.3*  HCT 29.3* 31.4* 29.7*  MCV 89.1 93.2 93.1  PLT 126* 171 167   Cardiac Enzymes: No results for input(s): "CKTOTAL", "CKMB", "CKMBINDEX", "TROPONINI" in the last 168 hours. CBG: Recent Labs  Lab 07/30/22 1209 07/30/22 2059 07/31/22 0631 08/01/22 0024 08/01/22 0646  GLUCAP 135* 103* 109* 100* 109*    Studies/Results: No results found. Medications:    carvedilol  12.5 mg Oral BID WC   Chlorhexidine Gluconate Cloth  6 each Topical Q0600   darbepoetin (ARANESP) injection - DIALYSIS  100 mcg Subcutaneous Q Thu-1800   enoxaparin (LOVENOX) injection  30 mg Subcutaneous Q24H   levothyroxine  112 mcg Oral Q0600   modafinil  200 mg Oral Daily   pantoprazole  40 mg Oral QHS   pregabalin  25 mg Oral Daily   rosuvastatin  10 mg Oral Daily  zinc sulfate  220 mg Oral Daily

## 2022-08-01 NOTE — Progress Notes (Signed)
Pt asking about the Lyrica.  States he thought it was going to prescribe  3x a day.  Pt stating pain level is at a 6.  Given Tylenol but it's not doing that well with pain.

## 2022-08-02 DIAGNOSIS — Z89511 Acquired absence of right leg below knee: Secondary | ICD-10-CM | POA: Diagnosis not present

## 2022-08-02 LAB — GLUCOSE, CAPILLARY
Glucose-Capillary: 94 mg/dL (ref 70–99)
Glucose-Capillary: 112 mg/dL — ABNORMAL HIGH (ref 70–99)
Glucose-Capillary: 117 mg/dL — ABNORMAL HIGH (ref 70–99)

## 2022-08-02 MED ORDER — PREGABALIN 25 MG PO CAPS: 25.0000 mg | ORAL_CAPSULE | Freq: Three times a day (TID) | ORAL | Status: DC

## 2022-08-02 MED ORDER — CHLORHEXIDINE GLUCONATE CLOTH 2 % EX PADS: 6.0000 | MEDICATED_PAD | Freq: Every day | CUTANEOUS | Status: DC

## 2022-08-02 MED ORDER — PREGABALIN 25 MG PO CAPS
25.0000 mg | ORAL_CAPSULE | Freq: Three times a day (TID) | ORAL | Status: DC
  Administered 2022-08-02 – 2022-08-11 (×25): 25 mg via ORAL
  Filled 2022-08-02 (×26): qty 1

## 2022-08-02 NOTE — Progress Notes (Signed)
Occupational Therapy Session Note  Patient Details  Name: [name] MRN: [id number] Date of Birth: 05/31/1950  Today's Date: 08/02/2022 OT Individual Time: 1033-1120 & 1300-1410 OT Individual Time Calculation (min): 47 min & 70 min   Short Term Goals: Week 1:  OT Short Term Goal 1 (Week 1): Patient will complete stand pivot or squat pivot transfer to toilet/commode with min assist OT Short Term Goal 2 (Week 1): Patient will complete toilet hygiene with mod assist OT Short Term Goal 3 (Week 1): Patient will dress lower body with mod assist OT Short Term Goal 4 (Week 1): Patient will don/doff Leff prostheses with set up assistance in preparation for functional transfers OT Short Term Goal 5 (Week 1): Patient will don pull over shirt without physical assistance  Skilled Therapeutic Interventions/Progress Updates:  Session 1 Skilled OT intervention completed with focus on w/c management/education, functional transfers and BUE exercise. Pt received seated in w/c, agreeable to session. Unrated pain reported in R residual limb; pre-medicated. OT offered rest breaks, repositioning and elevation for pain reduction.  Pt declined self-care needs. Transported dependently in w/c <> gym for time/energy conservation. When prepping for squat pivot, R side of pt's personal w/c brake noted to be loose with wheel shifting despite being fully locked. Discussed how this can be safety hazard with transfers, especially when prepping for DC and him planning to use the w/c. Pt agreeable to transfer to EOM in prep for therapist to adjust w/c brake for safety. 2nd person present to stabilize w/c in prep for transfer, then mod A squat pivot to EOM with physical placement of LLE prosthesis on floor needed prior.   Pt completed 2x12 bicep curls and chest press with 5 pound dumbbell while OT and another PT (though not billed for co-treat) problem solved brake system. Though brake was able to be tightened, therapists noted  new issue of R wheel not being fully locked into place at the base and unable to be adjusted. With explanation that the wheel could dislodge at any time, pt agreeable to transfer into CIR w/c due to lack of safety of his personal chair, along with greater independence and efficiency with use of arm rest that is removable with the rehab chair vs his personal one that doesn't have it. Pt stated he bought the light weight chair OOP last year during previous rehab stay as it is lighter for his wife to transport, and it has been set up in it's current condition for the past year, but understands current recommendation for use or safe, rehab chair at this time especially while in rehab. Care team updated about status.  Of note- pt was CGA/min A for series of squat pivots from EOM > rehab chair demonstrating increased independence. Retrieved R amputee support pad for positioning. Back in room pt remained seated in w/c, with chair alarm on/activated, and with all needs in reach at end of session.  Session 2 Skilled OT intervention completed with focus on ADL retraining and functional transfers within a shower context. Pt received seated in w/c, agreeable to session. Unrated pain reported in R residual limb; pre-medicated. OT offered rest breaks, repositioning and moist heat via shower for pain reduction.  With education about benefits of moist heat for muscle/spasms relaxation, pt agreeable to shower since wound vac removed prior to session. OT applied waterproof cover to R residual limb, and R arm IV prior to shower. Dependent transport to TTB in shower, then CGA lateral scoot from w/c with removable  arm rest > bench using grab bar. Doffed LLE prosthetic independently. Doffed clothing with supervision using lateral leans.   With R limb propped on trash can with towels for comfort vs dangling down, pt was able to bathe all at the seated level with intermittent supervision. Unable to adequately scoot to w/c due to  altered balance strategies without prosthetic on, therefore retrieved new sleeve and pt able to donn that and prosthetic to L limb independently, then min A squat pivot to w/c using grab bar. Able to donn shirt/deo with set up A, pt unable to each bottom of L foot with orthotic on therefore cued pt to doff for independence with threading both limbs into LB clothing. Able to thread without A, independent with redonning prosthetic, but required x2 w/c push up on w/c arm rest for therapist to scoop clothing under buttocks.  Pt with onset of nausea however no vomitting. Per diabetic protocol, issued pt diet ginger ale and graham crackers for symptom management, with nurse approval. NT indicated that pt hasn't eaten all day, therefore educated pt on importance of eating small snacks, to prevent symptoms with meds on empty stomach. Pt able to consume the crackers without difficulty.  Pt remained seated in w/c, with chair alarm on/activated, and with all needs in reach at end of session.   Therapy Documentation Precautions:  Precautions Precautions: Fall, Other (comment) Precaution Comments: R LE wound vac Restrictions Weight Bearing Restrictions: Yes RLE Weight Bearing: Non weight bearing Other Position/Activity Restrictions: RLE    Therapy/Group: Individual Therapy  Melvyn Novas, MS, OTR/L  08/02/2022, 2:11 PM

## 2022-08-02 NOTE — Progress Notes (Signed)
PROGRESS NOTE   Subjective/Complaints: Patient had a rough night but as soon as he took lyrica this morning his phantom pain immediately felt better. He asks for his Lyrica dose to be increased to TID 75mg  as at home  ROS: denies much residual limb pain, +phantom limb pain- improved immediately with Lyrica  Objective:   No results found. Recent Labs    07/30/22 1450 08/01/22 1438  WBC 6.6 6.9  HGB 9.3* 9.0*  HCT 29.7* 27.2*  PLT 167 158   Recent Labs    07/30/22 1450 08/01/22 1438  NA 134* 131*  K 3.9 3.7  CL 94* 94*  CO2 24 24  GLUCOSE 157* 134*  BUN 62* 48*  CREATININE 9.13* 8.04*  CALCIUM 8.4* 8.3*    Intake/Output Summary (Last 24 hours) at 08/02/2022 1000 Last data filed at 08/01/2022 1808 Gross per 24 hour  Intake 390 ml  Output 2500 ml  Net -2110 ml        Physical Exam: Vital Signs Blood pressure (!) 140/65, pulse (!) 57, temperature 97.6 F (36.4 C), temperature source Oral, resp. rate 18, height (P) 4\' 5"  (1.346 m), weight 86.5 kg, SpO2 97 %. Skin:    Comments: Wound VAC in place to right BKA. No drainage Neurological:     Comments: Patient is alert and makes eye contact with examiner.  Provides his name and age.  He was limited medical historian.  Complains of right lower extremity pain. Trouble swallowing pills     General: No acute distress Mood and affect are appropriate Heart: Bradycardia Lungs: Clear to auscultation, breathing unlabored, no rales or wheezes Abdomen: Positive bowel sounds, soft nontender to palpation, nondistended Extremities: No clubbing, cyanosis, or edema Skin: No evidence of breakdown, no evidence of rash Neurologic: Cranial nerves II through XII intact, motor strength is 4/5 in bilateral deltoid, bicep, tricep, grip, hip flexor, L knee extensors, RIght knee ext not tested due to Wound vac, bilateral distal testing deferred due to bilateral BKA Sensory exam normal  sensation to light touch except for left little finger sensation in bilateral upper  Musculoskeletal: Full range of motion bilateral upper extremities bilateral hand intrinsic minus deformities. Right BKA with wound VAC limiting examination, left knee range of motion appears to be within functional limits wearing left BKA prosthetic Straps on prosthesis coming off     Assessment/Plan: 1. Functional deficits which require 3+ hours per day of interdisciplinary therapy in a comprehensive inpatient rehab setting. Physiatrist is providing close team supervision and 24 hour management of active medical problems listed below. Physiatrist and rehab team continue to assess barriers to discharge/monitor patient progress toward functional and medical goals  Care Tool:  Bathing    Body parts bathed by patient: Face, Right arm, Left arm, Chest, Abdomen, Right upper leg, Left upper leg   Body parts bathed by helper: Buttocks, Front perineal area Body parts n/a: Right lower leg, Left lower leg   Bathing assist Assist Level: Moderate Assistance - Patient 50 - 74%     Upper Body Dressing/Undressing Upper body dressing   What is the patient wearing?: Pull over shirt    Upper body assist Assist Level: Minimal Assistance -  Patient > 75%    Lower Body Dressing/Undressing Lower body dressing      What is the patient wearing?: Underwear/pull up, Pants     Lower body assist Assist for lower body dressing: Total Assistance - Patient < 25%     Toileting Toileting    Toileting assist Assist for toileting: Total Assistance - Patient < 25%     Transfers Chair/bed transfer  Transfers assist     Chair/bed transfer assist level: Minimal Assistance - Patient > 75% (AP/PA)     Locomotion Ambulation   Ambulation assist   Ambulation activity did not occur: Safety/medical concerns (2/2 pt fatigue and LLE prosthetic stability)          Walk 10 feet activity   Assist  Walk 10 feet  activity did not occur: Safety/medical concerns (2/2 pt fatigue and LLE prosthetic stability)        Walk 50 feet activity   Assist Walk 50 feet with 2 turns activity did not occur: Safety/medical concerns (2/2 pt fatigue and LLE prosthetic stability)         Walk 150 feet activity   Assist Walk 150 feet activity did not occur: Safety/medical concerns (2/2 pt fatigue and LLE prosthetic stability)         Walk 10 feet on uneven surface  activity   Assist Walk 10 feet on uneven surfaces activity did not occur: Safety/medical concerns (2/2 pt fatigue and LLE prosthetic stability)         Wheelchair     Assist Is the patient using a wheelchair?: Yes Type of Wheelchair: Manual    Wheelchair assist level: Supervision/Verbal cueing Max wheelchair distance: 150    Wheelchair 50 feet with 2 turns activity    Assist        Assist Level: Supervision/Verbal cueing   Wheelchair 150 feet activity     Assist      Assist Level: Supervision/Verbal cueing   Blood pressure (!) 140/65, pulse (!) 57, temperature 97.6 F (36.4 C), temperature source Oral, resp. rate 18, height (P) 4\' 5"  (1.346 m), weight 86.5 kg, SpO2 97 %.  Medical Problem List and Plan: 1. Functional deficits secondary to right BKA secondary to abscess 07/26/2022 with wound VAC per Dr. Lajoyce Corners as well as history of left BKA 05/09/2021.  Patient does have a prosthesis            placed order for wound vac removal 4/26  2 3XL shrinkers ordered             -ELOS/Goals: 12-14d, Sup/minA level goals  2.  Antithrombotics: -DVT/anticoagulation:  Pharmaceutical: Heparin             -antiplatelet therapy: N/A 3. Residual limb pain: N/A Oxycodone d/ced 4. Mood/Behavior/Sleep: Trazodone nightly as needed             -antipsychotic agents: N/A 5. Neuropsych/cognition: This patient is capable of making decisions on his own behalf. 6. Skin/Wound Care: Routine skin checks and left BKA wound VAC may be  removed 08/02/2022 7. Fluids/Electrolytes/Nutrition: Routine in and outs with follow-up chemistries 8.  End-stage renal disease.  Continue hemodialysis as directed 9.  Diabetes mellitus.  Latest hemoglobin A1c  5.0.  d/c SSI. Provided list of foods for good blood pressure control 10.  Acute on chronic anemia.  Continue Aranesp 11.  Hypothyroidism.  Synthroid 12.  Hyperlipidemia.  Crestor 13.  CAD/CABG 2008 as well as history of atrial flutter with ablation 04/06/2020.  Follow-up outpatient with Dr.  Taylor 14.  History of prostate cancer TURP.  Follow-up outpatient 15.  Hypertension.  Monitor with increased mobility.  Patient had been on Coreg 12.5 mg twice daily, Cozaar 50 mg twice daily prior to admission on hold due to blood pressure being soft.  Resume as needed 16. Difficulty swallowing pills: SLP consulted, vitamin C supplement d/ced since it is too large to swallow 17. Fatigue: increase modafinil to 200mg  daily 18. Straps on prosthesis coming off: contacted [employer] to adjust straps, discussed that they evaluated him yesterday and will be back today.  19. Dry eyes: eye drops started 20. Loose stools: discussed resolution with one time dose of imodium 21. Phantom limb pain: Lyrica increased to 25mg  TID, patient requested increasing dose to 75mg  TID as he did at home and I initially discussed with him that it is better to uptitrate slowly so as to avoid sedation, he was continued to prefer 75mg  TID so I said we could try this, but after discussion with pharmacist who provided me with following info: "pregabalin use in hemodialysis was associated with up to a 68% higher risk of altered mental status and falls, respectively, compared to no use (Ref). This association was statistically significant, even in dose ranges of =100 mg/day. Use pregabalin cautiously in hemodialysis patients at the lowest effective dose with close monitoring" decided to decrease to 25mg  TID, I feel this will be enough to  control patient's pain since patient had great response with 25mg  this morning, will adjust timings to morning,12, and 4 as per patient's request, will discuss with nursing  >50 minutes spent in review of chart, discussion of Lyrica with patient, pharmacist, and nursing, discussion of loose stools and improvement with one time dose of imodium   LOS: 4 days A FACE TO FACE EVALUATION WAS PERFORMED  [name] P [name] 08/02/2022, 10:00 AM

## 2022-08-02 NOTE — Progress Notes (Signed)
Orthopedic Tech Progress Note Patient Details:  [name] 1950-05-29 [id number]  Patient ID: [name], male   DOB: [birth year], 72 y.o.   MRN: [id number] Order placed with [employer] for BK shrinkers. Darleen Crocker 08/02/2022, 10:57 AM

## 2022-08-02 NOTE — Progress Notes (Signed)
Subjective:   no new issues -- HD yest-  removed 2500, tolerated well -  tells me he is slated to go home on 5/5  Objective Vital signs in last 24 hours: Vitals:   08/01/22 1956 08/02/22 0534 08/02/22 0740 08/02/22 1222  BP: (!) 145/50 (!) 140/65    Pulse: 66 (!) 57    Resp: 18 18 18 18   Temp: 97.9 F (36.6 C) 97.6 F (36.4 C)    TempSrc: Oral Oral    SpO2: 100% 97%    Weight:      Height:       Weight change:   Physical Exam: General: Alert chronically ill-appearing male NAD Heart: RRR no MRG Lungs: CTA bilaterally nonlabored breathing Abdomen: NABS soft NTND Extremities: Right BKA dressing dry clean /left BKA, with prosthesis on Dialysis Access: LUA AV fistula+  bruit  Dialysis Orders: [facility] (Dr. Signe Colt follows)- Sun - Wed - Fri 3:48hr, BFR 425, EDW 88kg, L AVF, no heparin - Venofer 100mg  IV weekly.    Problem/Plan:  1. RLE cellulitis:  right BKA 4/19 Dr. Lajoyce Corners, had wound VAC, antibiotics complete/CIR admit  2. Acute Hypoxic Resp Failure: Admit intubated / extubated 4/16 /stable now on room air.  UF as tolerated 3. ESRD: [facility] / in hosp TTS schedule while here - Next HD 4/27 via [facility].  Checking labs with HD 4. HTN/volume: BP stable on HD, variable bed weights - 1L UFG. 5. Anemia: Hgb now 9.8 - s/p 2 units PRBCs 4/18. Aranesp given 4/18. Stable in the 9's 6. Secondary hyperparathyroidism:  CorrCa/Phos 4.2 -on Renvela as outpatient no current binder for good phos /no vitamin D as OP-  will check labs with HD 7. Nutrition: ALB 2.9 /on protein supp  8. T2DM 9. CAD   Annie Sable, MD 08/02/2022    Labs: Basic Metabolic Panel: Recent Labs  Lab 07/27/22 0801 07/29/22 2058 07/30/22 1450 08/01/22 1438  NA 132*  --  134* 131*  K 3.8  --  3.9 3.7  CL 94*  --  94* 94*  CO2 24  --  24 24  GLUCOSE 160*  --  157* 134*  BUN 49*  --  62* 48*  CREATININE 7.54* 8.42* 9.13* 8.04*  CALCIUM 7.9*  --  8.4* 8.3*  PHOS 4.2  --  3.9 4.4   Liver Function  Tests: Recent Labs  Lab 07/27/22 0801 07/30/22 1450 08/01/22 1438  ALBUMIN 2.9* 2.9* 3.0*   No results for input(s): "LIPASE", "AMYLASE" in the last 168 hours.  No results for input(s): "AMMONIA" in the last 168 hours. CBC: Recent Labs  Lab 07/27/22 0801 07/29/22 2058 07/30/22 1450 08/01/22 1438  WBC 7.9 8.5 6.6 6.9  HGB 9.8* 9.7* 9.3* 9.0*  HCT 29.3* 31.4* 29.7* 27.2*  MCV 89.1 93.2 93.1 89.8  PLT 126* 171 167 158   Cardiac Enzymes: No results for input(s): "CKTOTAL", "CKMB", "CKMBINDEX", "TROPONINI" in the last 168 hours. CBG: Recent Labs  Lab 08/01/22 0646 08/01/22 1156 08/01/22 1908 08/02/22 0559 08/02/22 1121  GLUCAP 109* 97 86 94 112*    Studies/Results: No results found. Medications:    carvedilol  12.5 mg Oral BID WC   Chlorhexidine Gluconate Cloth  6 each Topical Q0600   darbepoetin (ARANESP) injection - DIALYSIS  100 mcg Subcutaneous Q Thu-1800   enoxaparin (LOVENOX) injection  30 mg Subcutaneous Q24H   levothyroxine  112 mcg Oral Q0600   modafinil  200 mg Oral Daily   pantoprazole  40 mg Oral  QHS   pregabalin  25 mg Oral TID   rosuvastatin  10 mg Oral Daily   zinc sulfate  220 mg Oral Daily

## 2022-08-02 NOTE — Progress Notes (Signed)
Physical Therapy Session Note  Patient Details  Name: [name] MRN: [id number] Date of Birth: 1950/06/11  Today's Date: 08/02/2022 PT Individual Time: [phone] and 1430-1458 PT Individual Time Calculation (min): 53 min and 28 min  Short Term Goals: Week 1:  PT Short Term Goal 1 (Week 1): Pt will completed sit to stand from [facility] with ModA + LRAD PT Short Term Goal 2 (Week 1): Pt will maintain static standing balance with CGA + LRAD PT Short Term Goal 3 (Week 1): Pt will initiate SPT practice with LRAD  Skilled Therapeutic Interventions/Progress Updates:   Treatment Session 1 Received pt sitting in WC, pt agreeable to PT treatment, and reported pain 5/10 in R residual limb (premedicated) - repositioning, rest breaks, and distraction done to reduce pain levels. Session with emphasis on functional mobility/transfers, generalized strengthening and endurance, and dynamic standing balance/coordination. Pt asking if wound vac could be removed prior to starting therapy - RN notified and disconnected wound vac but will remove it after therapy today due to time commitment. Pt refused to wear limb guard today despite encouragement and education on purpose. Pt transported to/from room in [facility] dependently for energy conservation purposes. Pt performed x 2 squat<>pivot transfers towards L with mod A getting onto mat and CGA getting back into WC - assist to stabilize WC due to brakes not locking completely.   Worked on blocked practice sit<>stands from elevated EOM with RW and mod A fading to min A x 5 reps - cues for L hand placement on mat and R hand placement on RW. Pt required rest break in between reps, then transitioned to seated knee extension 2x10 bilaterally with 2.5lb ankle weight on L prosthetic with emphasis on contracture prevention, followed by hip flexion 2x10 bilaterally with 2.5lb ankle weight on L prosthetic. Notified MD of need for extra 3XL shrinkers. Pt able to don legrests with supervision,  then returned to room and concluded session with pt sitting in [facility] with all needs within reach.   Treatment Session 2 Received pt sitting in WC putting shirt on. Pt agreeable to PT treatment and did not rate pain level but reported feeling nauseous. Briefly discussed broken pieces of pt's personal WC and using one without broken pieces while here for safety - pt plans on reaching out to [employer] [facility] company where he purchased WC to discuss further. Pt transported to/from room in [facility] dependently for energy conservation purposes. Pt performed the following exercises with emphasis on UE/core strength with rest breaks in between exercises: -WC push ups x5 -trunk rotations with 3.3lb medicine ball x10 bilaterally -horizontal chest press with 3.3lb medicine ball x10 -overhead chest press with 3.3lb medicine ball x10 Returned to room and concluded session with pt sitting in [facility] with all needs within reach  Therapy Documentation Precautions:  Precautions Precautions: Fall, Other (comment) Precaution Comments: R LE wound vac Restrictions Weight Bearing Restrictions: Yes RLE Weight Bearing: Non weight bearing Other Position/Activity Restrictions: RLE  Therapy/Group: Individual Therapy Martin Majestic PT, DPT  08/02/2022, 7:15 AM

## 2022-08-03 DIAGNOSIS — Z89511 Acquired absence of right leg below knee: Secondary | ICD-10-CM | POA: Diagnosis not present

## 2022-08-03 LAB — RENAL FUNCTION PANEL
Albumin: 3.2 g/dL — ABNORMAL LOW (ref 3.5–5.0)
Anion gap: 15 (ref 5–15)
BUN: 47 mg/dL — ABNORMAL HIGH (ref 8–23)
CO2: 23 mmol/L (ref 22–32)
Calcium: 8.1 mg/dL — ABNORMAL LOW (ref 8.9–10.3)
Chloride: 89 mmol/L — ABNORMAL LOW (ref 98–111)
Creatinine, Ser: 7.99 mg/dL — ABNORMAL HIGH (ref 0.61–1.24)
GFR, Estimated: 7 mL/min — ABNORMAL LOW (ref >60)
Glucose, Bld: 93 mg/dL (ref 70–99)
Phosphorus: 5.7 mg/dL — ABNORMAL HIGH (ref 2.5–4.6)
Potassium: 4 mmol/L (ref 3.5–5.1)
Sodium: 127 mmol/L — ABNORMAL LOW (ref 135–145)

## 2022-08-03 LAB — CBC
HCT: 30.2 % — ABNORMAL LOW (ref 39.0–52.0)
Hemoglobin: 10.3 g/dL — ABNORMAL LOW (ref 13.0–17.0)
MCH: 29.9 pg (ref 26.0–34.0)
MCHC: 34.1 g/dL (ref 30.0–36.0)
MCV: 87.5 fL (ref 80.0–100.0)
Platelets: 169 10*3/uL (ref 150–400)
RBC: 3.45 MIL/uL — ABNORMAL LOW (ref 4.22–5.81)
RDW: 17.2 % — ABNORMAL HIGH (ref 11.5–15.5)
WBC: 9.8 10*3/uL (ref 4.0–10.5)
nRBC: 0 % (ref 0.0–0.2)

## 2022-08-03 LAB — GLUCOSE, CAPILLARY
Glucose-Capillary: 103 mg/dL — ABNORMAL HIGH (ref 70–99)
Glucose-Capillary: 89 mg/dL (ref 70–99)
Glucose-Capillary: 81 mg/dL (ref 70–99)

## 2022-08-03 MED ORDER — DARBEPOETIN ALFA 100 MCG/0.5ML IJ SOSY
100.0000 ug | PREFILLED_SYRINGE | INTRAMUSCULAR | Status: DC
  Administered 2022-08-04 – 2022-08-10 (×2): 100 ug via SUBCUTANEOUS
  Filled 2022-08-03 (×2): qty 0.5

## 2022-08-03 MED ORDER — OXYCODONE HCL 5 MG PO TABS
5.0000 mg | ORAL_TABLET | ORAL | Status: DC | PRN
  Administered 2022-08-03 – 2022-08-11 (×16): 5 mg via ORAL
  Filled 2022-08-03 (×18): qty 1

## 2022-08-03 NOTE — Progress Notes (Signed)
Subjective:   no new issues --due for HD later today  Objective Vital signs in last 24 hours: Vitals:   08/02/22 1523 08/02/22 1925 08/03/22 0407 08/03/22 0454  BP: (!) 141/76 (!) 157/64 (!) 160/63   Pulse: (!) 56 (!) 56 65   Resp: 17 18 18    Temp: 97.8 F (36.6 C) 98 F (36.7 C) 98 F (36.7 C)   TempSrc: Oral Oral Oral   SpO2: 100% 100% 97%   Weight:    86.4 kg  Height:       Weight change:   Physical Exam: General: Alert chronically ill-appearing male NAD Heart: RRR no MRG Lungs: CTA bilaterally nonlabored breathing Abdomen: NABS soft NTND Extremities: Right BKA dressing dry clean /left BKA, with prosthesis on Dialysis Access: LUA AV fistula+  bruit  Dialysis Orders: [facility] (Dr. Signe Colt follows)- Sun - Wed - Fri 3:48hr, BFR 425, EDW 88kg, L AVF, no heparin - Venofer 100mg  IV weekly.    Problem/Plan:  1. RLE cellulitis:  right BKA 4/19 Dr. Lajoyce Corners, had wound VAC, antibiotics complete/CIR admit  2. Acute Hypoxic Resp Failure: Admit intubated / extubated 4/16 /stable now on room air.  UF as tolerated 3. ESRD: [facility] / in hosp TTS schedule while here - Next HD 4/27 via [facility].  Checking labs with HD 4. HTN/volume: BP stable on HD, variable bed weights - 1L UFG. 5. Anemia: Hgb now 9.8 - s/p 2 units PRBCs 4/18. Aranesp given 4/18. Stable in the 9's 6. Secondary hyperparathyroidism:  CorrCa/Phos 4.2 -on Renvela as outpatient no current binder for good phos /no vitamin D as OP-  will check labs with HD 7. Nutrition: ALB 2.9 /on protein supp  8. T2DM 9. CAD   Patient will not be physically seen tomorrow.  Will be revisited on Monday.  His next dialysis is slated for Tuesday.  Call with any concerns   Annie Sable, MD 08/03/2022    Labs: Basic Metabolic Panel: Recent Labs  Lab 07/29/22 2058 07/30/22 1450 08/01/22 1438  NA  --  134* 131*  K  --  3.9 3.7  CL  --  94* 94*  CO2  --  24 24  GLUCOSE  --  157* 134*  BUN  --  62* 48*  CREATININE 8.42* 9.13* 8.04*   CALCIUM  --  8.4* 8.3*  PHOS  --  3.9 4.4   Liver Function Tests: Recent Labs  Lab 07/30/22 1450 08/01/22 1438  ALBUMIN 2.9* 3.0*   No results for input(s): "LIPASE", "AMYLASE" in the last 168 hours.  No results for input(s): "AMMONIA" in the last 168 hours. CBC: Recent Labs  Lab 07/29/22 2058 07/30/22 1450 08/01/22 1438  WBC 8.5 6.6 6.9  HGB 9.7* 9.3* 9.0*  HCT 31.4* 29.7* 27.2*  MCV 93.2 93.1 89.8  PLT 171 167 158   Cardiac Enzymes: No results for input(s): "CKTOTAL", "CKMB", "CKMBINDEX", "TROPONINI" in the last 168 hours. CBG: Recent Labs  Lab 08/02/22 0559 08/02/22 1121 08/02/22 1636 08/03/22 0600 08/03/22 1141  GLUCAP 94 112* 117* 103* 89    Studies/Results: No results found. Medications:    carvedilol  12.5 mg Oral BID WC   darbepoetin (ARANESP) injection - DIALYSIS  100 mcg Subcutaneous Q Sat-1800   enoxaparin (LOVENOX) injection  30 mg Subcutaneous Q24H   levothyroxine  112 mcg Oral Q0600   modafinil  200 mg Oral Daily   pantoprazole  40 mg Oral QHS   pregabalin  25 mg Oral TID   rosuvastatin  10 mg Oral Daily   zinc sulfate  220 mg Oral Daily

## 2022-08-03 NOTE — Progress Notes (Addendum)
Patient had some new onset nausea. Patient also complained of itches and some patches on his left upper leg. Lotion did help with itches on patient's back. Patient has complained of intense pain and tylenol not covering pain.

## 2022-08-03 NOTE — Progress Notes (Signed)
PROGRESS NOTE   Subjective/Complaints:  Pt reports was taken off Oxy due to itching- however no improvement in itching AND pain uncontrolled.   LBM 2 days ago, but had diarrhea for 1 week prior to that- took Imodium 2 days ago.  Asking about HD time  Does note, is finally eating.  ROS:   Pt denies SOB, abd pain, CP, N/V/C/D, and vision changes Pain uncontrolled  Objective:   No results found. Recent Labs    08/01/22 1438  WBC 6.9  HGB 9.0*  HCT 27.2*  PLT 158   Recent Labs    08/01/22 1438  NA 131*  K 3.7  CL 94*  CO2 24  GLUCOSE 134*  BUN 48*  CREATININE 8.04*  CALCIUM 8.3*    Intake/Output Summary (Last 24 hours) at 08/03/2022 1007 Last data filed at 08/03/2022 0900 Gross per 24 hour  Intake 940 ml  Output --  Net 940 ml        Physical Exam: Vital Signs Blood pressure (!) 160/63, pulse 65, temperature 98 F (36.7 C), temperature source Oral, resp. rate 18, height (P) 4\' 5"  (1.346 m), weight 86.4 kg, SpO2 97 %.   General: awake, alert, appropriate, sitting up in bedside chair; c/o severe pain;  NAD HENT: conjugate gaze; oropharynx moist CV: regular rate; no JVD Pulmonary: CTA B/L; no W/R/R- good air movement GI: soft, NT, ND, (+)BS- slightly hypoactive Psychiatric: appropriate- but irritable due to pain Neurological: alert  Skin: VAC off BKA Neurologic: Cranial nerves II through XII intact, motor strength is 4/5 in bilateral deltoid, bicep, tricep, grip, hip flexor, L knee extensors, RIght knee ext not tested due to Wound vac, bilateral distal testing deferred due to bilateral BKA Sensory exam normal sensation to light touch except for left little finger sensation in bilateral upper  Musculoskeletal: Full range of motion bilateral upper extremities bilateral hand intrinsic minus deformities. Right BKA with wound VAC limiting examination, left knee range of motion appears to be within  functional limits wearing left BKA prosthetic Straps on prosthesis coming off     Assessment/Plan: 1. Functional deficits which require 3+ hours per day of interdisciplinary therapy in a comprehensive inpatient rehab setting. Physiatrist is providing close team supervision and 24 hour management of active medical problems listed below. Physiatrist and rehab team continue to assess barriers to discharge/monitor patient progress toward functional and medical goals  Care Tool:  Bathing    Body parts bathed by patient: Face, Right arm, Left arm, Chest, Abdomen, Right upper leg, Left upper leg, Front perineal area, Buttocks   Body parts bathed by helper: Buttocks, Front perineal area Body parts n/a: Right lower leg, Left lower leg   Bathing assist Assist Level: Supervision/Verbal cueing     Upper Body Dressing/Undressing Upper body dressing   What is the patient wearing?: Pull over shirt    Upper body assist Assist Level: Set up assist    Lower Body Dressing/Undressing Lower body dressing      What is the patient wearing?: Underwear/pull up, Pants, Orthosis (L prosthesis)     Lower body assist Assist for lower body dressing: Moderate Assistance - Patient 50 - 74%  Toileting Toileting    Toileting assist Assist for toileting: Maximal Assistance - Patient 25 - 49%     Transfers Chair/bed transfer  Transfers assist     Chair/bed transfer assist level: Minimal Assistance - Patient > 75% (AP/PA)     Locomotion Ambulation   Ambulation assist   Ambulation activity did not occur: Safety/medical concerns (2/2 pt fatigue and LLE prosthetic stability)          Walk 10 feet activity   Assist  Walk 10 feet activity did not occur: Safety/medical concerns (2/2 pt fatigue and LLE prosthetic stability)        Walk 50 feet activity   Assist Walk 50 feet with 2 turns activity did not occur: Safety/medical concerns (2/2 pt fatigue and LLE prosthetic  stability)         Walk 150 feet activity   Assist Walk 150 feet activity did not occur: Safety/medical concerns (2/2 pt fatigue and LLE prosthetic stability)         Walk 10 feet on uneven surface  activity   Assist Walk 10 feet on uneven surfaces activity did not occur: Safety/medical concerns (2/2 pt fatigue and LLE prosthetic stability)         Wheelchair     Assist Is the patient using a wheelchair?: Yes Type of Wheelchair: Manual    Wheelchair assist level: Supervision/Verbal cueing Max wheelchair distance: 150    Wheelchair 50 feet with 2 turns activity    Assist        Assist Level: Supervision/Verbal cueing   Wheelchair 150 feet activity     Assist      Assist Level: Supervision/Verbal cueing   Blood pressure (!) 160/63, pulse 65, temperature 98 F (36.7 C), temperature source Oral, resp. rate 18, height (P) 4\' 5"  (1.346 m), weight 86.4 kg, SpO2 97 %.  Medical Problem List and Plan: 1. Functional deficits secondary to right BKA secondary to abscess 07/26/2022 with wound VAC per Dr. Lajoyce Corners as well as history of left BKA 05/09/2021.  Patient does have a prosthesis           Con't CIR PT and OT- wound VAC removed 4/26             -ELOS/Goals: 12-14d, Sup/minA level goals  2.  Antithrombotics: -DVT/anticoagulation:  Pharmaceutical: Heparin             -antiplatelet therapy: N/A 3. Residual limb pain: N/A Oxycodone d/ced  4/27- Pt insists that Oxy be restarted, since didn't make any difference in itching- itching likely due to HD patient.  4. Mood/Behavior/Sleep: Trazodone nightly as needed             -antipsychotic agents: N/A 5. Neuropsych/cognition: This patient is capable of making decisions on his own behalf. 6. Skin/Wound Care: Routine skin checks and left BKA wound VAC may be removed 08/02/2022 7. Fluids/Electrolytes/Nutrition: Routine in and outs with follow-up chemistries 8.  End-stage renal disease.  Continue hemodialysis as  directed 9.  Diabetes mellitus.  Latest hemoglobin A1c  5.0.  d/c SSI. Provided list of foods for good blood pressure control  4/27- BG's well controlled- con't regimen 10.  Acute on chronic anemia.  Continue Aranesp 11.  Hypothyroidism.  Synthroid 12.  Hyperlipidemia.  Crestor 13.  CAD/CABG 2008 as well as history of atrial flutter with ablation 04/06/2020.  Follow-up outpatient with Dr. Ladona Ridgel 14.  History of prostate cancer TURP.  Follow-up outpatient 15.  Hypertension.  Monitor with increased mobility.  Patient had  been on Coreg 12.5 mg twice daily, Cozaar 50 mg twice daily prior to admission on hold due to blood pressure being soft.  Resume as needed  4/27- BP 160 systolic this AM- running 140s-160s systolic in last 48 hours- however going to HD today, so will wait to restart BP meds for today- will see how does in HD.  16. Difficulty swallowing pills: SLP consulted, vitamin C supplement d/ced since it is too large to swallow 17. Fatigue: increase modafinil to 200mg  daily 18. Straps on prosthesis coming off: contacted [employer] to adjust straps, discussed that they evaluated him yesterday and will be back today.  19. Dry eyes: eye drops started 20. Loose stools: discussed resolution with one time dose of imodium 21. Phantom limb pain: Lyrica increased to 25mg  TID, patient requested increasing dose to 75mg  TID as he did at home and I initially discussed with him that it is better to uptitrate slowly so as to avoid sedation, he was continued to prefer 75mg  TID so I said we could try this, but after discussion with pharmacist who provided me with following info: "pregabalin use in hemodialysis was associated with up to a 68% higher risk of altered mental status and falls, respectively, compared to no use (Ref). This association was statistically significant, even in dose ranges of =100 mg/day. Use pregabalin cautiously in hemodialysis patients at the lowest effective dose with close monitoring" decided  to decrease to 25mg  TID, I feel this will be enough to control patient's pain since patient had great response with 25mg  this morning, will adjust timings to morning,12, and 4 as per patient's request, will discuss with nursing  I spent a total of  41  minutes on total care today- >50% coordination of care- due to  D/w nursing x2 about Oxy restarting-  And prolonged d/w pt about Oxy and Lyrica-   LOS: 5 days A FACE TO FACE EVALUATION WAS PERFORMED  Avyanna Spada 08/03/2022, 10:07 AM

## 2022-08-04 DIAGNOSIS — Z89511 Acquired absence of right leg below knee: Secondary | ICD-10-CM | POA: Diagnosis not present

## 2022-08-04 LAB — GLUCOSE, CAPILLARY
Glucose-Capillary: 110 mg/dL — ABNORMAL HIGH (ref 70–99)
Glucose-Capillary: 100 mg/dL — ABNORMAL HIGH (ref 70–99)
Glucose-Capillary: 110 mg/dL — ABNORMAL HIGH (ref 70–99)

## 2022-08-04 MED ORDER — TIZANIDINE HCL 4 MG PO TABS
2.0000 mg | ORAL_TABLET | Freq: Two times a day (BID) | ORAL | Status: DC | PRN
  Administered 2022-08-04 (×2): 2 mg via ORAL
  Filled 2022-08-04 (×2): qty 1

## 2022-08-04 NOTE — Progress Notes (Signed)
Occupational Therapy Session Note  Patient Details  Name: [name] MRN: [id number] Date of Birth: 04/10/1950  Today's Date: 08/04/2022 OT Individual Time: 1000-1100 OT Individual Time Calculation (min): 60 min    Short Term Goals: Week 1:  OT Short Term Goal 1 (Week 1): Patient will complete stand pivot or squat pivot transfer to toilet/commode with min assist OT Short Term Goal 2 (Week 1): Patient will complete toilet hygiene with mod assist OT Short Term Goal 3 (Week 1): Patient will dress lower body with mod assist OT Short Term Goal 4 (Week 1): Patient will don/doff Leff prostheses with set up assistance in preparation for functional transfers OT Short Term Goal 5 (Week 1): Patient will don pull over shirt without physical assistance  Skilled Therapeutic Interventions/Progress Updates:   Handoff from NT for skilled OT visit with pt completing BM on toilet upon OT arrival. Appeared to be straining with pushing and moaning sounds with OT reporting to relax and not rush. Prolonged time for BM with OT setting up am routine. Pt able to have BM successfully and required mod A for hygiene. Pt was in STEDY frame for transport via NT thus able to push to stand back up with CGA. Min A for underwear and shorts mngt. Transported to w/c for face washing, shaving and oral care with set up. Pt declining donning L LE limb guard/protector due to pain. OT assisted with repositioning. Seated push ups for pressure relief and residual limb rest in place. Nursing called re: pain and arrived with new spasm relief meds and pt agreeable as prescribed by MD this am. OT issued resistance grasp foam cube (heavy) with B 20 reps x 2 set gross grasp and pinches each. Hand off back to NT as pt required toileting again at end of session.    Pain: pt reports 5/10 pain R residual limb with relief with distraction and elevation, nursing administered spasm pain meds trial    Therapy Documentation Precautions:   Precautions Precautions: Fall, Other (comment) Precaution Comments: R LE wound vac Restrictions Weight Bearing Restrictions: Yes RLE Weight Bearing: Non weight bearing Other Position/Activity Restrictions: RLE   Therapy/Group: Individual Therapy  Vicenta Dunning 08/04/2022, 7:41 AM

## 2022-08-04 NOTE — Progress Notes (Addendum)
PROGRESS NOTE   Subjective/Complaints:  Pt reports bad pahntom pain and residual limb pain this AM- just got meds ~ 5 minutes ago.  Spoke with PT_ to do mirror therapy this AM.  LBM yesterday.     ROS:    Pt denies SOB, abd pain, CP, N/V/C/D, and vision changes  Pain uncontrolled  Objective:   No results found. Recent Labs    08/01/22 1438 08/03/22 1630  WBC 6.9 9.8  HGB 9.0* 10.3*  HCT 27.2* 30.2*  PLT 158 169   Recent Labs    08/01/22 1438 08/03/22 1630  NA 131* 127*  K 3.7 4.0  CL 94* 89*  CO2 24 23  GLUCOSE 134* 93  BUN 48* 47*  CREATININE 8.04* 7.99*  CALCIUM 8.3* 8.1*    Intake/Output Summary (Last 24 hours) at 08/04/2022 0933 Last data filed at 08/03/2022 2356 Gross per 24 hour  Intake 100 ml  Output 2000 ml  Net -1900 ml        Physical Exam: Vital Signs Blood pressure (!) 145/51, pulse (!) 53, temperature 97.8 F (36.6 C), temperature source Oral, resp. rate 18, height (P) 4\' 5"  (1.346 m), weight 84.2 kg, SpO2 99 %.    General: awake, alert, appropriate,   crying out in pain-3-4x during interview; with PT; in w/c; NAD HENT: conjugate gaze; oropharynx moist CV: bradycardic rate; no JVD Pulmonary: CTA B/L; no W/R/R- good air movement GI: soft, NT, ND, (+)BS- normoactive Psychiatric: appropriate- crying out in pain frequently- c/o phantom pain Neurological: alert-  Skin: VAC off BKA- R BKA bulbous with shrinker in place- no drainage out of shrinker seen Neurologic: Cranial nerves II through XII intact, motor strength is 4/5 in bilateral deltoid, bicep, tricep, grip, hip flexor, L knee extensors, RIght knee ext not tested due to Wound vac, bilateral distal testing deferred due to bilateral BKA Sensory exam normal sensation to light touch except for left little finger sensation in bilateral upper  Musculoskeletal: Full range of motion bilateral upper extremities bilateral hand intrinsic  minus deformities. Right BKA with wound VAC limiting examination, left knee range of motion appears to be within functional limits wearing left BKA prosthetic Straps on prosthesis coming off     Assessment/Plan: 1. Functional deficits which require 3+ hours per day of interdisciplinary therapy in a comprehensive inpatient rehab setting. Physiatrist is providing close team supervision and 24 hour management of active medical problems listed below. Physiatrist and rehab team continue to assess barriers to discharge/monitor patient progress toward functional and medical goals  Care Tool:  Bathing    Body parts bathed by patient: Face, Right arm, Left arm, Chest, Abdomen, Right upper leg, Left upper leg, Front perineal area, Buttocks   Body parts bathed by helper: Buttocks, Front perineal area Body parts n/a: Right lower leg, Left lower leg   Bathing assist Assist Level: Supervision/Verbal cueing     Upper Body Dressing/Undressing Upper body dressing   What is the patient wearing?: Pull over shirt    Upper body assist Assist Level: Set up assist    Lower Body Dressing/Undressing Lower body dressing      What is the patient wearing?: Underwear/pull up, Pants,  Orthosis (L prosthesis)     Lower body assist Assist for lower body dressing: Moderate Assistance - Patient 50 - 74%     Toileting Toileting    Toileting assist Assist for toileting: Maximal Assistance - Patient 25 - 49%     Transfers Chair/bed transfer  Transfers assist     Chair/bed transfer assist level: Minimal Assistance - Patient > 75% (AP/PA)     Locomotion Ambulation   Ambulation assist   Ambulation activity did not occur: Safety/medical concerns (2/2 pt fatigue and LLE prosthetic stability)          Walk 10 feet activity   Assist  Walk 10 feet activity did not occur: Safety/medical concerns (2/2 pt fatigue and LLE prosthetic stability)        Walk 50 feet activity   Assist Walk 50  feet with 2 turns activity did not occur: Safety/medical concerns (2/2 pt fatigue and LLE prosthetic stability)         Walk 150 feet activity   Assist Walk 150 feet activity did not occur: Safety/medical concerns (2/2 pt fatigue and LLE prosthetic stability)         Walk 10 feet on uneven surface  activity   Assist Walk 10 feet on uneven surfaces activity did not occur: Safety/medical concerns (2/2 pt fatigue and LLE prosthetic stability)         Wheelchair     Assist Is the patient using a wheelchair?: Yes Type of Wheelchair: Manual    Wheelchair assist level: Supervision/Verbal cueing Max wheelchair distance: 150    Wheelchair 50 feet with 2 turns activity    Assist        Assist Level: Supervision/Verbal cueing   Wheelchair 150 feet activity     Assist      Assist Level: Supervision/Verbal cueing   Blood pressure (!) 145/51, pulse (!) 53, temperature 97.8 F (36.6 C), temperature source Oral, resp. rate 18, height (P) 4\' 5"  (1.346 m), weight 84.2 kg, SpO2 99 %.  Medical Problem List and Plan: 1. Functional deficits secondary to right BKA secondary to abscess 07/26/2022 with wound VAC per Dr. Lajoyce Corners as well as history of left BKA 05/09/2021.  Patient does have a prosthesis           Con't CIR PT, and PT- limb guard and shrinker to RLE- wearing LLE prosthesis             -ELOS/Goals: 12-14d, Sup/minA level goals  2.  Antithrombotics: -DVT/anticoagulation:  Pharmaceutical: Heparin             -antiplatelet therapy: N/A 3. Residual limb pain: N/A Oxycodone d/ced  4/27- Pt insists that Oxy be restarted, since didn't make any difference in itching- itching likely due to HD patient.   4/28- pain was doing better since restarted Oxy. Worse this AM, since went too long between meds 4. Mood/Behavior/Sleep: Trazodone nightly as needed             -antipsychotic agents: N/A 5. Neuropsych/cognition: This patient is capable of making decisions on his own  behalf. 6. Skin/Wound Care: Routine skin checks and left BKA wound VAC may be removed 08/02/2022 7. Fluids/Electrolytes/Nutrition: Routine in and outs with follow-up chemistries 8.  End-stage renal disease.  Continue hemodialysis as directed 9.  Diabetes mellitus.  Latest hemoglobin A1c  5.0.  d/c SSI. Provided list of foods for good blood pressure control  4/27- BG's well controlled- con't regimen 10.  Acute on chronic anemia.  Continue Aranesp 11.  Hypothyroidism.  Synthroid 12.  Hyperlipidemia.  Crestor 13.  CAD/CABG 2008 as well as history of atrial flutter with ablation 04/06/2020.  Follow-up outpatient with Dr. Ladona Ridgel 14.  History of prostate cancer TURP.  Follow-up outpatient 15.  Hypertension.  Monitor with increased mobility.  Patient had been on Coreg 12.5 mg twice daily, Cozaar 50 mg twice daily prior to admission on hold due to blood pressure being soft.  Resume as needed  4/27- BP 160 systolic this AM- running 140s-160s systolic in last 48 hours- however going to HD today, so will wait to restart BP meds for today- will see how does in HD.   4/28- BP 140s systolic this AM- con't regimen 16. Difficulty swallowing pills: SLP consulted, vitamin C supplement d/ced since it is too large to swallow 17. Fatigue: increase modafinil to 200mg  daily 18. Straps on prosthesis coming off: contacted [employer] to adjust straps, discussed that they evaluated him yesterday and will be back today.  19. Dry eyes: eye drops started 20. Loose stools: discussed resolution with one time dose of imodium 21. Phantom limb pain: Lyrica increased to 25mg  TID, patient requested increasing dose to 75mg  TID as he did at home and I initially discussed with him that it is better to uptitrate slowly so as to avoid sedation, he was continued to prefer 75mg  TID so I said we could try this, but after discussion with pharmacist who provided me with following info: "pregabalin use in hemodialysis was associated with up to a 68%  higher risk of altered mental status and falls, respectively, compared to no use (Ref). This association was statistically significant, even in dose ranges of =100 mg/day. Use pregabalin cautiously in hemodialysis patients at the lowest effective dose with close monitoring" decided to decrease to 25mg  TID, I feel this will be enough to control patient's pain since patient had great response with 25mg  this morning, will adjust timings to morning,12, and 4 as per patient's request, will discuss with nursing  Addendum- nursing/pt asking ofr muscle relaxant- will try Zanaflex 2 mg BID prn- since on HD  I spent a total of 36   minutes on total care today- >50% coordination of care- due to  D/w PT about mirror therapy and rec to pt to take meds regularly-  LOS: 6 days A FACE TO FACE EVALUATION WAS PERFORMED  Maylon Sailors 08/04/2022, 9:33 AM

## 2022-08-04 NOTE — Progress Notes (Signed)
Patient returned from dialysis. Reported received from RN Obus H. Endorsed pain at "5" with right lower leg. Set up for dinner. No issues reported by patient outside of pain and wanting to void after eating his meal.

## 2022-08-04 NOTE — Progress Notes (Signed)
Physical Therapy Session Note  Patient Details  Name: [name] MRN: [id number] Date of Birth: 1950-10-15  Today's Date: 08/04/2022 PT Individual Time: 1300-1413 PT Individual Time Calculation (min): 73 min   Short Term Goals: Week 1:  PT Short Term Goal 1 (Week 1): Pt will completed sit to stand from [facility] with ModA + LRAD PT Short Term Goal 2 (Week 1): Pt will maintain static standing balance with CGA + LRAD PT Short Term Goal 3 (Week 1): Pt will initiate SPT practice with LRAD  Skilled Therapeutic Interventions/Progress Updates:      Pt seated in WC finishing lunch upon arrival. Pt agreeable to therapy. Pt denies any pain initially but reports 5/10 phantom pain during session. Therapist trailed mirror therapy with pt in long sitting and mirror placed between legs while pt performed 1x10 quad sets and 1x10 hip abduction. Pt reports he does not believe it is working and would rather not continue. Therapist demos and pt returns demo of densitization technique, pt reports it helps. Pt refused to wear limb protector depsite therapist providing education on purpose of limb protector.   Pt self propelled room to day room with supervision in [facility], verbal cues provided for obstacle negotiation on R side.   Pt performed squat pivot x4 WC<>mat table, with verbal cues for head hip ratio and CGA to min A.   Pt performed sit<>stand with RW and min-mod A, pt demos improved recall of UE placement, verbal cues provided for LE placement, scooting to EOB and forward trunk lean. Verbal cues provided for use of UE with controlled descent for stand<>sit.   Pt performed stand pivot tranfser x1 with RW and min A, verbal cues provided for pushing through B UE for LE clearance. Pt reports feeling significnatly weaker in afternoon compared to morning.   Pt performed 1x10 wheelchair pushup with verbal cues for use of B UE for  controlled descent.   Pt performed 1x10 R LAQ, hip flexion, hip abduction, hip adduction,  glute sets. Verbal cues for completion within full range and to reduce compensation.   Pt seated in WC at end of session with seatbelt alarm on and all needs within reach at end of session.       Therapy Documentation Precautions:  Precautions Precautions: Fall, Other (comment) Precaution Comments: R LE wound vac Restrictions Weight Bearing Restrictions: Yes RLE Weight Bearing: Non weight bearing Other Position/Activity Restrictions: RLE   Therapy/Group: Individual Therapy  [facility] [place], [place], DPT  08/04/2022, 1:50 PM

## 2022-08-04 NOTE — Progress Notes (Signed)
Physical Therapy Session Note  Patient Details  Name: ELDON ZIETLOW MRN: [id number] Date of Birth: 11/24/1950  Today's Date: 08/04/2022 PT Individual Time: 1191-4782 PT Individual Time Calculation (min): 74 min   Short Term Goals: Week 1:  PT Short Term Goal 1 (Week 1): Pt will completed sit to stand from [facility] with ModA + LRAD PT Short Term Goal 2 (Week 1): Pt will maintain static standing balance with CGA + LRAD PT Short Term Goal 3 (Week 1): Pt will initiate SPT practice with LRAD  Skilled Therapeutic Interventions/Progress Updates:      Therapy Documentation Precautions:  Precautions Precautions: Fall, Other (comment) Precaution Comments: R LE wound vac Restrictions Weight Bearing Restrictions: Yes RLE Weight Bearing: Non weight bearing Other Position/Activity Restrictions: RLE   Pt received seated in w/c at bedside agreeable to PT session with emphasis on phantom pain management through sensory desensitization, flexibility training and transfer training.   Pt agreeable to don R LE limb guard and pt transported by w/c dependently for time management and energy conservation.   Pt requires CGA for squat pivot transfer to mat and supervision with sit to lying. Pt with increased reports of right residual limb and phantom pain and pt reports touching and rubbing residual limb helps with pain management.   PT provided sensory desensitization to right residual limb and gentle stretching as pt with fasciculations and spasms contributing to increase in pain.   PT performed right adductor and hamstring stretch 3 x 2 minutes and pt reports pain " little better" following intervention. PT also provided healthy distraction through conversation and music therapy in session.   Pt CGA with squat pivot transfer to return to w/c and transported to room and left seated in w/c at bedside with all needs in reach and alarm on.    Therapy/Group: Individual Therapy  Truitt Leep Truitt Leep  PT, DPT  08/04/2022, 7:44 AM

## 2022-08-05 DIAGNOSIS — Z89511 Acquired absence of right leg below knee: Secondary | ICD-10-CM | POA: Diagnosis not present

## 2022-08-05 LAB — GLUCOSE, CAPILLARY: Glucose-Capillary: 103 mg/dL — ABNORMAL HIGH (ref 70–99)

## 2022-08-05 MED ORDER — PREGABALIN 25 MG PO CAPS
25.0000 mg | ORAL_CAPSULE | Freq: Every day | ORAL | Status: DC
  Administered 2022-08-05: 25 mg via ORAL
  Filled 2022-08-05: qty 1

## 2022-08-05 MED ORDER — CHLORHEXIDINE GLUCONATE CLOTH 2 % EX PADS
6.0000 | MEDICATED_PAD | Freq: Every day | CUTANEOUS | Status: DC
  Administered 2022-08-05 – 2022-08-07 (×3): 6 via TOPICAL

## 2022-08-05 NOTE — Progress Notes (Signed)
Patient ID: [name], male   DOB: 12-12-1950, 72 y.o.   MRN: [id number]  Family education Friday 9a-12p

## 2022-08-05 NOTE — Progress Notes (Signed)
Occupational Therapy Session Note  Patient Details  Name: [name] MRN: [id number] Date of Birth: 1950-05-29  Today's Date: 08/05/2022 OT Individual Time: 1105-1200 & 1300-1415 OT Individual Time Calculation (min): 55 min & 75 min   Short Term Goals: Week 1:  OT Short Term Goal 1 (Week 1): Patient will complete stand pivot or squat pivot transfer to toilet/commode with min assist OT Short Term Goal 2 (Week 1): Patient will complete toilet hygiene with mod assist OT Short Term Goal 3 (Week 1): Patient will dress lower body with mod assist OT Short Term Goal 4 (Week 1): Patient will don/doff Leff prostheses with set up assistance in preparation for functional transfers OT Short Term Goal 5 (Week 1): Patient will don pull over shirt without physical assistance  Skilled Therapeutic Interventions/Progress Updates:  Session 1 Skilled OT intervention completed with focus on DC planning, toilet transfers/education, blocked practice sit > stands from lower surfaces. Pt received seated in w/c, agreeable to session. 3/10 pain reported in R residual limb; pre-medicated. OT offered rest breaks, repositioning and elevation throughout for pain reduction.  Pt updated OT on plan for wife to measure doorways and potential plan for for purchase of a stedy along with hired caregiving for the DC plan per pt preference vs therapy recommendations with pt's CLOF. Pt described in greater detail his options for toileting. Preferred option is toilet that flushes but is a room inside a room with a doorway that originally will not fit a w/c width for squat pivot transfer. Another option (though least preferred) that he used at DC after prior rehab stay, was a free standing toilet not connected to plumbing (as he hates BSC and buckets method) that is in the first section of bathroom that is w/c accessible. Discussed suggestion of widening the 2nd bathroom doorway for accessibility with wife planning to look into that  method.  Encouraged pt to trial transfer to standard toilet bowl vs BSC as this is what he will do at home. Pt was able to squat pivot/lateral scoot from w/c > toilet with min A without use of grab bar as he currently has none installed at the non-flushable toilet. Required use of grab bar for CGA lateral scoot transfer back to w/c, with plan for pt to have grab bars installed due to dependence on them and to increase safety with transfers.  Education provided on donning leg rests and advised pt to purchase (he prefers to get things on his own) a R amputee support pad for his w/c at home that has removable arm rests in prep for positioning at DC. Min A needed to donn R side, supervision for L. Transported dependently in w/c <> gym for time.  Able to position w/c with supervision to EOM, then CGA squat pivot from w/c <> EOM within session.  While seated EOM, pt practiced sit > stand from varying surfaces in prep for standing need (without stedy) during toileting/LB dressing at home. From 21.5 in mat, pt completed sit > stand using RW with min A, requiring several trials to power up due to poor sequence of scooting towards EOM first. From 20 in mat (w/c is about this height), pt stood with initial CGA using RW increasing to min A to finish powering up.  Back in room, pt remained seated in w/c, and with all needs in reach at end of session.  Session 2 Skilled OT intervention completed with focus on limb care, w/c mobility, BUE strengthening. Pt received seated in  w/c, agreeable to session. 3/10 pain reported in R residual limb; pre-medicated. OT offered rest breaks and repositioning for pain reduction.  Pt was able to recall that shrinker/bandages have not been removed or changed in 2 days, but required cues/education on doffing/donning new shrinker and inspecting limb at least once day himself, in prep for DC routine to support proper healing of the amputation. Pt agreeable to uncover R amputation, with  pt able to doff shrinker without A, but total A to unravel bandages. Incision with 1 dried scab about size of a dime, with no active bleeding present. Retrieved long handled mirror for self-inspection (has one at home from previous amputation). Cues needed for attending to underneath the limb not just on top. Did have phantom pain intermittently, declining use of mirror therapy, but did complete rubbing and desensitization for pain management.   OT applied non-adherent pad to the scabbed spot only to prevent pulling of skin with next removal of shrinker. Pt was unable to donn 3XL shrinker alone, with pt participating in the anchor method, overall mod A needed. He tolerated the 3XL, especially since wearing the 3X previously with additional layers of bandages underneath though has 4X in room if needed. Encouraged pt to wash the shrinker however declined. Nurse notified of wound care status.  Donned limb guard with supervision. Self-propelled in w/c <> gym about 100 ft with mod I. Pt completed the following exercises to promote BUE strength needed for functional transfers and dependence on RW for stands: (With 5 lb dumbbell, 2 x 12 each arm) -Chest press -Bicep press -Overhead press -Eccentric phase tricep extension  Back in room, pt remained seated in w/c, with all needs in reach at end of session.   Therapy Documentation Precautions:  Precautions Precautions: Fall, Other (comment) Precaution Comments: R LE wound vac Restrictions Weight Bearing Restrictions: Yes RLE Weight Bearing: Non weight bearing Other Position/Activity Restrictions: RLE    Therapy/Group: Individual Therapy  Melvyn Novas, MS, OTR/L  08/05/2022, 2:21 PM

## 2022-08-05 NOTE — Progress Notes (Signed)
[facility] Progress Note   Subjective:   Very upset about last HD- had dialysis overnight and had to wait an hour for transportation. Also reports he had prolonged bleeding from his AVF and upset that he cannot use his buttonholes here. Denies SOB, CP, palpitations, dizziness and nausea.   Objective Vitals:   08/05/22 0151 08/05/22 0320 08/05/22 0432 08/05/22 0810  BP:  (!) 145/63  (!) 138/56  Pulse:  63  63  Resp: 20 18  18   Temp:  (!) 97.5 F (36.4 C)    TempSrc:  Oral    SpO2:  100%  99%  Weight:   83 kg   Height:       Physical Exam General: Alert male in NAD Heart: RRR, no murmurs, rubs or gallops Lungs: CTA bilaterally, respirations unlabored on RA Abdomen: Soft, non-distended, +BS Extremities: B/l BKA, no edema noted Dialysis Access:  LUE AVF with clean appearing bandage, + bruit  Additional Objective Labs: Basic Metabolic Panel: Recent Labs  Lab 07/30/22 1450 08/01/22 1438 08/03/22 1630  NA 134* 131* 127*  K 3.9 3.7 4.0  CL 94* 94* 89*  CO2 24 24 23   GLUCOSE 157* 134* 93  BUN 62* 48* 47*  CREATININE 9.13* 8.04* 7.99*  CALCIUM 8.4* 8.3* 8.1*  PHOS 3.9 4.4 5.7*   Liver Function Tests: Recent Labs  Lab 07/30/22 1450 08/01/22 1438 08/03/22 1630  ALBUMIN 2.9* 3.0* 3.2*   No results for input(s): "LIPASE", "AMYLASE" in the last 168 hours. CBC: Recent Labs  Lab 07/29/22 2058 07/30/22 1450 08/01/22 1438 08/03/22 1630  WBC 8.5 6.6 6.9 9.8  HGB 9.7* 9.3* 9.0* 10.3*  HCT 31.4* 29.7* 27.2* 30.2*  MCV 93.2 93.1 89.8 87.5  PLT 171 167 158 169   Blood Culture    Component Value Date/Time   SDES  07/22/2022 1835    BLOOD BLOOD RIGHT ARM Performed at [facility], [address] [place], [place] [address]    [facility]  07/22/2022 1835    Blood Culture adequate volume BOTTLES DRAWN AEROBIC AND ANAEROBIC Performed at [facility], [address] [place], [place] [address]    CULT   07/22/2022 1835    NO GROWTH 5 DAYS Performed at [facility], [address] [place], [place] [address]    REPTSTATUS 07/27/2022 FINAL 07/22/2022 1835    Cardiac Enzymes: No results for input(s): "CKTOTAL", "CKMB", "CKMBINDEX", "TROPONINI" in the last 168 hours. CBG: Recent Labs  Lab 08/03/22 1636 08/04/22 0600 08/04/22 1120 08/04/22 1706 08/05/22 0557  GLUCAP 81 110* 100* 110* 103*   Iron Studies: No results for input(s): "IRON", "TIBC", "TRANSFERRIN", "FERRITIN" in the last 72 hours. @lablastinr3 @ Studies/Results: No results found. Medications:   carvedilol  12.5 mg Oral BID WC   darbepoetin (ARANESP) injection - DIALYSIS  100 mcg Subcutaneous Q Sat-1800   enoxaparin (LOVENOX) injection  30 mg Subcutaneous Q24H   levothyroxine  112 mcg Oral Q0600   modafinil  200 mg Oral Daily   pantoprazole  40 mg Oral QHS   pregabalin  25 mg Oral TID   pregabalin  25 mg Oral QHS   rosuvastatin  10 mg Oral Daily   zinc sulfate  220 mg Oral Daily    Dialysis Orders: [facility] (Dr. Signe Colt follows)- Sun - Wed - Fri 3:48hr, BFR 425, EDW 88kg, L AVF, no heparin - Venofer 100mg  IV weekly.  Assessment/Plan: 1. RLE cellulitis:  right BKA 4/19 Dr. Lajoyce Corners, had wound VAC, antibiotics complete/CIR  admit  2. Acute Hypoxic Resp Failure: Admit intubated / extubated 4/16 /stable now on room air.  UF as tolerated 3. ESRD: [facility] / in hosp TTS schedule while here. Multiple customer service complaints. Will convey his concerns to HD unit. Reports prolonged bleeding from AVF, appears to have stopped now. Was getting lovenox, no heparin with HD. Lovenox was held today. 4. HTN/volume: BP stable on HD, variable bed weights - 2L UFG. Will need a new EDW s/p amputation.  5. Anemia: s/p 2 units PRBCs 4/18. Aranesp given 4/18. Stable in the 9-10s 6. Secondary hyperparathyroidism:  CorrCa controlled, not on VDRA. Phos 5.7- on renvela outpatient but not taking here, if next phos is still elevated will  restart.  7. Nutrition: Albumin improving. On protein supplements. on protein supp  8. T2DM: per admitting team  Rogers Blocker, PA-C 08/05/2022, 11:00 AM  [facility] Pager: [phone]

## 2022-08-05 NOTE — Progress Notes (Signed)
PROGRESS NOTE   Subjective/Complaints: Feels that phantom pain is improving every day and it seems to be worst at night, discussed adding lyrica 25mg  HS and he is agreeable     ROS:    Pt denies SOB, abd pain, CP, N/V/C/D, and vision changes, +phantom limb pain  Objective:   No results found. Recent Labs    08/03/22 1630  WBC 9.8  HGB 10.3*  HCT 30.2*  PLT 169   Recent Labs    08/03/22 1630  NA 127*  K 4.0  CL 89*  CO2 23  GLUCOSE 93  BUN 47*  CREATININE 7.99*  CALCIUM 8.1*    Intake/Output Summary (Last 24 hours) at 08/05/2022 1007 Last data filed at 08/05/2022 0815 Gross per 24 hour  Intake 120 ml  Output 200 ml  Net -80 ml        Physical Exam: Vital Signs Blood pressure (!) 138/56, pulse 63, temperature (!) 97.5 F (36.4 C), temperature source Oral, resp. rate 18, height (P) 4\' 5"  (1.346 m), weight 83 kg, SpO2 99 %.  Gen: no distress, normal appearing HEENT: oral mucosa pink and moist, NCAT Cardio: Reg rate Chest: normal effort, normal rate of breathing Abd: soft, non-distended Ext: no edema Psych: pleasant, normal affect Neurological: alert-  Skin: VAC off BKA- R BKA bulbous with shrinker in place- no drainage out of shrinker seen Neurologic: Cranial nerves II through XII intact, motor strength is 4/5 in bilateral deltoid, bicep, tricep, grip, hip flexor, L knee extensors, RIght knee ext not tested due to Wound vac, bilateral distal testing deferred due to bilateral BKA Sensory exam normal sensation to light touch except for left little finger sensation in bilateral upper  Musculoskeletal: Full range of motion bilateral upper extremities bilateral hand intrinsic minus deformities. Right BKA with wound VAC limiting examination, left knee range of motion appears to be within functional limits wearing left BKA prosthetic Straps on prosthesis coming off     Assessment/Plan: 1. Functional  deficits which require 3+ hours per day of interdisciplinary therapy in a comprehensive inpatient rehab setting. Physiatrist is providing close team supervision and 24 hour management of active medical problems listed below. Physiatrist and rehab team continue to assess barriers to discharge/monitor patient progress toward functional and medical goals  Care Tool:  Bathing    Body parts bathed by patient: Face, Right arm, Left arm, Chest, Abdomen, Right upper leg, Left upper leg, Front perineal area, Buttocks   Body parts bathed by helper: Buttocks, Front perineal area Body parts n/a: Right lower leg, Left lower leg   Bathing assist Assist Level: Supervision/Verbal cueing     Upper Body Dressing/Undressing Upper body dressing   What is the patient wearing?: Pull over shirt    Upper body assist Assist Level: Set up assist    Lower Body Dressing/Undressing Lower body dressing      What is the patient wearing?: Underwear/pull up, Pants, Orthosis (L prosthesis)     Lower body assist Assist for lower body dressing: Moderate Assistance - Patient 50 - 74%     Toileting Toileting    Toileting assist Assist for toileting: Maximal Assistance - Patient 25 - 49%  Transfers Chair/bed transfer  Transfers assist     Chair/bed transfer assist level: Minimal Assistance - Patient > 75% (stand<>pivot)     Locomotion Ambulation   Ambulation assist   Ambulation activity did not occur: Safety/medical concerns (2/2 pt fatigue and LLE prosthetic stability)          Walk 10 feet activity   Assist  Walk 10 feet activity did not occur: Safety/medical concerns (2/2 pt fatigue and LLE prosthetic stability)        Walk 50 feet activity   Assist Walk 50 feet with 2 turns activity did not occur: Safety/medical concerns (2/2 pt fatigue and LLE prosthetic stability)         Walk 150 feet activity   Assist Walk 150 feet activity did not occur: Safety/medical concerns  (2/2 pt fatigue and LLE prosthetic stability)         Walk 10 feet on uneven surface  activity   Assist Walk 10 feet on uneven surfaces activity did not occur: Safety/medical concerns (2/2 pt fatigue and LLE prosthetic stability)         Wheelchair     Assist Is the patient using a wheelchair?: Yes Type of Wheelchair: Manual    Wheelchair assist level: Supervision/Verbal cueing Max wheelchair distance: 150    Wheelchair 50 feet with 2 turns activity    Assist        Assist Level: Supervision/Verbal cueing   Wheelchair 150 feet activity     Assist      Assist Level: Supervision/Verbal cueing   Blood pressure (!) 138/56, pulse 63, temperature (!) 97.5 F (36.4 C), temperature source Oral, resp. rate 18, height (P) 4\' 5"  (1.346 m), weight 83 kg, SpO2 99 %.  Medical Problem List and Plan: 1. Functional deficits secondary to right BKA secondary to abscess 07/26/2022 with wound VAC per Dr. Lajoyce Corners as well as history of left BKA 05/09/2021.  Patient does have a prosthesis           Con't CIR PT, and PT- limb guard and shrinker to RLE- wearing LLE prosthesis             -ELOS/Goals: 12-14d, Sup/minA level goals  2.  Antithrombotics: -DVT/anticoagulation:  Pharmaceutical: Heparin             -antiplatelet therapy: N/A 3. Residual limb pain: N/A Oxycodone d/ced  4/27- Pt insists that Oxy be restarted, since didn't make any difference in itching- itching likely due to HD patient.   4/28- pain was doing better since restarted Oxy. Worse this AM, since went too long between meds 4. Mood/Behavior/Sleep: Trazodone nightly as needed             -antipsychotic agents: N/A 5. Neuropsych/cognition: This patient is capable of making decisions on his own behalf. 6. Skin/Wound Care: Routine skin checks and left BKA wound VAC may be removed 08/02/2022 7. Fluids/Electrolytes/Nutrition: Routine in and outs with follow-up chemistries 8.  End-stage renal disease.  Continue  hemodialysis as directed 9.  Diabetes mellitus.  Latest hemoglobin A1c  5.0.  d/c SSI. Provided list of foods for good blood pressure control  4/27- BG's well controlled- con't regimen 10.  Acute on chronic anemia.  Continue Aranesp 11.  Hypothyroidism.  Synthroid 12.  Hyperlipidemia.  Crestor 13.  CAD/CABG 2008 as well as history of atrial flutter with ablation 04/06/2020.  Follow-up outpatient with Dr. Ladona Ridgel 14.  History of prostate cancer TURP.  Follow-up outpatient 15.  Hypertension.  Monitor with increased mobility.  Patient had been on Coreg 12.5 mg twice daily, Cozaar 50 mg twice daily prior to admission on hold due to blood pressure being soft.  BP reviewed and continues to be soft so will hold 16. Difficulty swallowing pills: SLP consulted, vitamin C supplement d/ced since it is too large to swallow 17. Fatigue/daytime somnolence: continue modafinil to 200mg  daily 18. Straps on prosthesis coming off: appreciate [employer] eval 19. Dry eyes: eye drops started 20. Loose stools: discussed resolution with one time dose of imodium 21. Phantom limb pain: Lyrica increased to 25mg  QID, Zanaflex added 22. Hyponatremia: continue to monitor with HD labs  >50 minutes spent in review of chart and labs, discussion of phantom limb pain and wound vac removal, discussion of adding lyrica 25mg  HS since this is when pain is worst   LOS: 7 days A FACE TO FACE EVALUATION WAS PERFORMED  Shakiyla Kook P Mathis Cashman 08/05/2022, 10:07 AM

## 2022-08-05 NOTE — Discharge Summary (Signed)
Physician Discharge Summary  Patient ID: [name] MRN: [id number] DOB/AGE: [birth year] 72 y.o.  Admit date: 07/29/2022 Discharge date: 08/11/2022  Discharge Diagnoses:  Principal Problem:   Right below-knee amputee [facility]) DVT prophylaxis End-stage renal disease Diabetes mellitus Acute on chronic anemia Hypothyroidism Hyperlipidemia CAD/CABG 2008 as well as history of atrial flutter with ablation Hypertension History of left BKA History of prostate cancer Fatigue  Discharged Condition: Stable  Significant Diagnostic Studies: DG Chest Port 1 View  Result Date: 07/24/2022 CLINICAL DATA:  Follow-up lung status post extubation. EXAM: PORTABLE CHEST 1 VIEW COMPARISON:  Portable chest April 15 at 11:51 p.m. FINDINGS: 4:45 a.m. interval extubation and NGT removal. Lungs are clear. No pleural effusion is seen. Stable cardiomegaly and CABG change. Central vascular fullness continues to be seen without overt edema. The mediastinum is stable with aortic atherosclerosis. There is partially visible fusion plating in the lower cervical region. IMPRESSION: 1. Interval extubation and NGT removal. 2. Stable cardiomegaly and CABG change.  Clear lungs. 3. Central vascular fullness without overt edema. Electronically Signed   By: Almira Bar M.D.   On: 07/24/2022 07:11   DG Abdomen 1 View  Result Date: 07/23/2022 CLINICAL DATA:  NG tube placement EXAM: ABDOMEN - 1 VIEW COMPARISON:  None Available. FINDINGS: The bowel gas pattern is normal. No radio-opaque calculi or other significant radiographic abnormality are seen. Enteric tube tip projects over the left upper quadrant. The enteric tube is incompletely visualized on this image. On chest radiograph performed immediately after this abdominal radiograph at 11:44 p.m. the enteric tube tip is in the stomach with side port likely near the GE junction. IMPRESSION: Enteric tube tip projects over the left upper quadrant. The enteric tube is incompletely  visualized on this image. On chest radiograph performed immediately after this abdominal radiograph at 11:44 p.m. the enteric tube tip is in the stomach with side port likely near the GE junction. Electronically Signed   By: Minerva Fester M.D.   On: 07/23/2022 00:13   DG Chest Portable 1 View  Result Date: 07/23/2022 CLINICAL DATA:  Post intubation EXAM: PORTABLE CHEST 1 VIEW COMPARISON:  Chest radiographs and CT 07/22/2022. FINDINGS: Endotracheal tube in the intrathoracic trachea approximately 6.9 cm from the carina. Subdiaphragmatic enteric tube. Sternotomy. Cervical spine fusion hardware. Low lung volumes accentuate pulmonary vascularity and cardiomediastinal silhouette. No focal consolidation, pleural effusion, or pneumothorax. No displaced rib fractures. IMPRESSION: Endotracheal tube in the intrathoracic trachea approximately 6.9 cm from the carina. Electronically Signed   By: Minerva Fester M.D.   On: 07/23/2022 00:10   CT CHEST ABDOMEN PELVIS W CONTRAST  Result Date: 07/22/2022 CLINICAL DATA:  Altered mental status, weakness, sepsis EXAM: CT CHEST, ABDOMEN, AND PELVIS WITH CONTRAST TECHNIQUE: Multidetector CT imaging of the chest, abdomen and pelvis was performed following the standard protocol during bolus administration of intravenous contrast. RADIATION DOSE REDUCTION: This exam was performed according to the departmental dose-optimization program which includes automated exposure control, adjustment of the mA and/or kV according to patient size and/or use of iterative reconstruction technique. CONTRAST:  80mL OMNIPAQUE IOHEXOL 300 MG/ML  SOLN COMPARISON:  CT abdomen/pelvis dated 06/20/2022 FINDINGS: CT CHEST FINDINGS Cardiovascular: The heart is normal in size. No pericardial effusion. No evidence of thoracic aortic aneurysm. Atherosclerotic calcifications of the aortic arch. Severe three-vessel coronary atherosclerosis. Postsurgical changes related to prior CABG. Mediastinum/Nodes: No  suspicious mediastinal lymphadenopathy. Visualized thyroid is unremarkable. Lungs/Pleura: Evaluation of the lung parenchyma is constrained by respiratory motion. Within that constraint, there are  no suspicious pulmonary nodules. Very mild dependent bilateral lower lobe atelectasis. No focal consolidation. No pleural effusion or pneumothorax. Musculoskeletal: Degenerative changes of the mid/lower thoracic spine. Cervical spine fixation hardware, incompletely visualized. Median sternotomy. Stable sclerotic lesion along the left T10 vertebral body (sagittal image 87), unchanged since 2022. While indeterminate in this clinical setting, stability favors a benign bone island. CT ABDOMEN PELVIS FINDINGS Hepatobiliary: Mildly nodular hepatic contour. Status post cholecystectomy. No intrahepatic or extrahepatic duct dilatation. Pancreas: Within normal limits. Spleen: Within normal limits. Adrenals/Urinary Tract: Stable 3.9 cm right adrenal mass with macroscopic fat, compatible with a benign myelolipoma. Nodular thickening of the left adrenal gland without discrete mass. Bilateral renal cysts, including 3.7 cm posterior right upper pole renal cyst (series 3/image 92). No hydronephrosis. Bladder is thick-walled although underdistended. Stomach/Bowel: Stomach is within normal limits. No evidence of bowel obstruction. Normal appendix (series 3/image 100). Sigmoid diverticulosis, without evidence of diverticulitis. Vascular/Lymphatic: No evidence of abdominal aortic aneurysm. Atherosclerotic calcifications of the abdominal aorta and branch vessels. No suspicious abdominopelvic lymphadenopathy. Reproductive: Prostatomegaly, with enlargement of the central gland indenting the base of the bladder, suggesting BPH. Additional fiducial markers. Other: Trace perihepatic and perisplenic ascites. Small volume pelvic ascites. This appearance is similar to the prior. Musculoskeletal: Degenerative changes of the lumbar spine. IMPRESSION: No  acute cardiopulmonary abnormality. Mildly nodular hepatic contour, correlate for cirrhosis. Small volume abdominopelvic ascites, chronic. Fiducial markers in the prostate. No findings specific for metastatic disease. Additional ancillary findings as above. Electronically Signed   By: Charline Bills M.D.   On: 07/22/2022 20:53   CT Head Wo Contrast  Result Date: 07/22/2022 CLINICAL DATA:  Altered mental status, delirium EXAM: CT HEAD WITHOUT CONTRAST TECHNIQUE: Contiguous axial images were obtained from the base of the skull through the vertex without intravenous contrast. RADIATION DOSE REDUCTION: This exam was performed according to the departmental dose-optimization program which includes automated exposure control, adjustment of the mA and/or kV according to patient size and/or use of iterative reconstruction technique. COMPARISON:  MRI brain dated 10/16/2021 FINDINGS: Brain: No evidence of acute infarction, hemorrhage, hydrocephalus, extra-axial collection or mass lesion/mass effect. Mild subcortical white matter and periventricular small vessel ischemic changes. Vascular: Intracranial atherosclerosis. Skull: Normal. Negative for fracture or focal lesion. Sinuses/Orbits: The visualized paranasal sinuses are essentially clear. The mastoid air cells are unopacified. Other: None. IMPRESSION: No acute intracranial abnormality. Mild small vessel ischemic changes. Electronically Signed   By: Charline Bills M.D.   On: 07/22/2022 20:46   DG Foot 2 Views Right  Result Date: 07/22/2022 CLINICAL DATA:  Osteomyelitis EXAM: RIGHT FOOT - 2 VIEW COMPARISON:  01/03/2020 FINDINGS: Two view radiograph of the right foot is limited by obliquity on frontal radiograph. Transmetatarsal amputation of the fourth and fifth digits is again noted. No acute fracture or dislocation. Large superior calcaneal spur. Vascular calcifications are noted. There is subcutaneous gas noted within the soft tissues lateral and plantar to  the fifth tarsometatarsal joint, however, no superimposed osseous erosion is identified to suggest osteomyelitis. IMPRESSION: 1. Subcutaneous gas within the soft tissues lateral and plantar to the fifth tarsometatarsal joint, reflective of local trauma and/or infection. No superimposed osseous erosion identified to suggest osteomyelitis. Electronically Signed   By: Helyn Numbers M.D.   On: 07/22/2022 19:25   DG Chest Portable 1 View  Result Date: 07/22/2022 CLINICAL DATA:  Sepsis EXAM: PORTABLE CHEST 1 VIEW COMPARISON:  06/20/2022 FINDINGS: Lungs are clear. No pneumothorax or pleural effusion. Coronary artery bypass grafting has been performed. Cardiac  size within normal limits. Pulmonary vascularity is normal. No acute bone abnormality. IMPRESSION: 1. No active disease. Electronically Signed   By: Helyn Numbers M.D.   On: 07/22/2022 19:22    Labs:  Basic Metabolic Panel: Recent Labs  Lab 08/03/22 1630 08/06/22 1200 08/08/22 1546  NA 127* 133* 135  K 4.0 5.8* 5.6*  CL 89* 94* 99  CO2 23 25 25   GLUCOSE 93 171* 148*  BUN 47* 62* 64*  CREATININE 7.99* 8.78* 7.71*  CALCIUM 8.1* 8.1* 8.2*  PHOS 5.7* 4.5 3.8    CBC: Recent Labs  Lab 08/03/22 1630 08/06/22 1200 08/08/22 1546  WBC 9.8 7.5 6.7  HGB 10.3* 9.1* 8.8*  HCT 30.2* 27.4* 28.3*  MCV 87.5 90.4 94.6  PLT 169 133* 130*    CBG: Recent Labs  Lab 08/03/22 1636 08/04/22 0600 08/04/22 1120 08/04/22 1706 08/05/22 0557  GLUCAP 81 110* 100* 110* 103*   Family history.  Mother with diabetes as well as kidney disease.  Father with diabetes Brother with stomach cancer.  Negative for colon cancer esophageal cancer or rectal cancer  Brief HPI:   [name] is a 72 y.o. right-handed male with history of left BKA receiving CIR 05/11/2021 - 05/24/2021 as well as history significant for hypertension hyperlipidemia CAD with CABG, atrial flutter with ablation, diabetes mellitus, peripheral vascular disease, prostate cancer with TURP, quit  smoking 33 years ago, end-stage renal disease with hemodialysis and chronic anemia.  Per chart review lives with spouse.  1 level home ramped entrance.  Ambulates with a rollator prior to admission and prosthesis.  Needing assist for ADLs.  Presented 07/22/2022 with altered mental status x 2 days low-grade fever and hypoxia as well as chronic right foot wound.  He required BiPAP on presentation with initial improvement but later his mental status declined and deteriorated subsequently required intubation and was extubated 07/23/2022.  COVID-19 influenza RSV PCR were negative.  Chest x-ray unremarkable.  Cranial CT scan negative.  CT chest abdomen and pelvis with no acute abnormality except for mild nodular hepatic contour question cirrhosis small volume ascites.  No findings specific for metastatic disease.  Admission chemistries unremarkable except sodium 132 chloride 89 glucose 125 BUN 48 creatinine 7.55 hemoglobin 7.9, blood cultures no growth to date, lactic acid within normal limits.  Renal service follow-up hemodialysis ongoing as directed.  Dr. Lajoyce Corners consulted in regards to abscess of right foot with x-ray showing subcutaneous gas within the soft tissue lateral and plantar to the fifth tarsometatarsal joint.  No superimposed osseous erosion identified to suggest osteomyelitis.  Limb was not felt to be salvageable and underwent right BKA 07/26/2022 per Dr. Lajoyce Corners and wound VAC was applied.  Patient was cleared to begin subcutaneous heparin for DVT prophylaxis.  Therapy evaluations completed due to patient decreased functional mobility was admitted for a comprehensive rehab program.   Hospital Course: [name] was admitted to rehab 07/29/2022 for inpatient therapies to consist of PT, ST and OT at least three hours five days a week. Past admission physiatrist, therapy team and rehab RN have worked together to provide customized collaborative inpatient rehab.  Pertaining to patient's right BKA secondary to  abscess 07/26/2022.  Patient follow-up Dr. Lajoyce Corners wound [facility] as directed.  Patient also with history of left BKA and did have a prosthesis.  Subcutaneous heparin for DVT prophylaxis no bleeding episodes.  Pain managed use of oxycodone as needed as well as Zanaflex for muscle spasms and scheduled Lyrica 25 mg 3  times daily for phantom pain.  Blood sugars controlled with diet and monitored and patient would continue Ozempic as outpatient.  End-stage renal disease with hemodialysis ongoing as directed with latest creatinine 8.78.  Blood pressure remained well-controlled on present regimen on Coreg and will need outpatient follow-up.  Noted bouts of fatigue maintained on modafinil 200 mg daily and monitored.  Synthroid ongoing for hypothyroidism.  Patient maintained on Crestor for hyperlipidemia.   Blood pressures were monitored on TID basis and soft and monitored  Diabetes has been monitored with ac/hs CBG checks and SSI was use prn for tighter BS control.    Rehab course: During patient's stay in rehab weekly team conferences were held to monitor patient's progress, set goals and discuss barriers to discharge. At admission, patient required moderate assist sit to stand supervision sit to supine moderate assist stand pivot transfers.  Physical exam.  Blood pressure 162/71 pulse 75 temperature 98.3 respirations 18 oxygen saturation 95% room air Constitutional.  No acute distress HEENT Head.  Normocephalic and atraumatic Eyes.  Pupils round and reactive to light no discharge without nystagmus Neck.  Supple nontender no JVD without thyromegaly Cardiac regular rate and rhythm without any extra sounds or murmur heard Abdomen.  Soft nontender positive bowel sounds without rebound Respiratory effort normal no respiratory distress without wheeze Skin.  Wound VAC in place Neurologic.  Alert makes eye contact with examiner provides name and age limited medical historian Cranial nerves II through XII intact,  motor strength 4/5 in bilateral deltoid bicep tricep grip hip flexors left knee extensors, right knee extensors not tested due to wound VAC  He/She  has had improvement in activity tolerance, balance, postural control as well as ability to compensate for deficits. He/She has had improvement in functional use RUE/LUE  and RLE/LLE as well as improvement in awareness.  Patient self-propelled wheelchair to the therapy gym with supervision.  Perform squat pivot x 4 wheelchair to mat table with verbal cues for head hip ratio and contact-guard to minimal assist.  Perform sit to stand rolling walker and min mod assist, patient demonstrates improved recall of upper extremity placement, verbal cues provided for lower extremity placement.  Scooting to edge of bed and forward trunk lean.  Verbal cues provided for use of upper extremity with controlled descent from sit to stand.  ADLs patient required some assistance for hygiene.  Minimal assist for underwear and shorts management.  He can shave and wash his face with set up.  Full family teaching completed plan discharge to home       Disposition: Discharge to home    Diet: Regular consistency diet  Special Instructions: No driving smoking or alcohol  Medications at discharge 1.  Tylenol as needed 2.  Coreg 12.5 mg p.o. twice daily with meals 3.  Aranesp weekly with hemodialysis 4.  Synthroid 112 mcg p.o. daily 5.  Zinc sulfate 220 mg p.o. daily 6.  Oxycodone 5 mg every 4 hours as needed moderate pain 7.  Protonix 40 mg p.o. nightly 8.  Lyrica 25 mg p.o. 3 times daily and 50 mg nightly 9.  Crestor 10 mg p.o. daily 10.  Zanaflex 2 mg p.o. twice daily as needed muscle spasms 11.  Zyloprim 200 mg daily 12.  Vitamin C 1000 mg p.o. daily 13.  Provigil 200 mg p.o. daily 14.  Vitamin D 50,000 units every 7 days 15.  Ozempic 0.5 mg weekly  30-35 minutes were spent completing discharge summary and discharge planning  Discharge Instructions  Ambulatory referral to Physical Medicine Rehab   Complete by: As directed    Moderate complexity follow-up 1 to 2 weeks right BKA with history of left BKA        Follow-up Information     Raulkar, Drema Pry, MD Follow up.   Specialty: Physical Medicine and Rehabilitation Why: Office to call for appointment Contact information: [address] [place] [address] [phone]         Nadara Mustard, MD Follow up.   Specialty: Orthopedic Surgery Why: Call for appointment Contact information: [address] [place] [address] [phone]         [place], Peter M, MD Follow up.   Specialty: Cardiology Why: Call for appointment as needed Contact information: [address] [place] [address] [phone]                 Signed: Mcarthur Rossetti Caden Fatica 08/09/2022, 5:13 AM

## 2022-08-05 NOTE — Progress Notes (Signed)
Encouraging patient throughout the evening and night time hours to void. Continues to state "in a minute".

## 2022-08-05 NOTE — Progress Notes (Signed)
Physical Therapy Session Note  Patient Details  Name: [name] MRN: [id number] Date of Birth: 1950/04/14  Today's Date: 08/05/2022 PT Individual Time: 0830-0943 PT Individual Time Calculation (min): 73 min   Short Term Goals: Week 1:  PT Short Term Goal 1 (Week 1): Pt will completed sit to stand from [facility] with ModA + LRAD PT Short Term Goal 2 (Week 1): Pt will maintain static standing balance with CGA + LRAD PT Short Term Goal 3 (Week 1): Pt will initiate SPT practice with LRAD  Skilled Therapeutic Interventions/Progress Updates:  Received pt sitting in WC, pt agreeable to PT treatment, and reported pain 3-4/10 in R residual limb. Session with emphasis on functional mobility/transfers, generalized strengthening and endurance, dynamic standing balance/coordination, and simulated car transfers. With encouragement, donned limb guard with max A and pt performed WC mobility >144ft using BUE and supervision, then transported remainder of way to ortho gym dependently for time management and energy conservation purposes.   Instructed pt on donning/doffing legrests with min A and hand over hand guidance x 2 trials throughout session. Pt performed simulated car transfer x 2 trials via squat<>pivot. Trial 1 with CGA/close supervision and Trial 2 with CGA to enter car and mod A to exit car due to difficultly clearing buttocks. Pt reports his biggest concerns are getting in/out of car, getting in/out of bed, and getting in/out of bathroom. Provided pt with 16x16 manual WC to trial as pt reports he thinks his bathroom is only 21in wide. However, ultimately 16x16 WC too small and uncomfortable. Pt called wife to request doorway measurements and bed height. Pt transferred WC<>mat via squat<>pivot with CGA (noted poor eccentric control, getting stuck on WC wheel, and "flopping" onto mat). Worked on blocked practice sit<>stands from 21in high mat x 5 reps with min A - pt with good recall of hand placement on RW and  mat. MD arrived for morning rounds during seated rest break. Pt then stood with RW and min A and performed stand<>pivot transfer mat<>WC with RW and min A - pt with difficulty pivoting on prosthetic and required assist from therapist to reposition WC behind pt to sit. Then discussed having pt's wife and hired caregiver come in for family education this week - CSW notified. Returned to room and provided pt with home measurement sheet. Concluded session with pt sitting in [facility] with all needs within reach.  Therapy Documentation Precautions:  Precautions Precautions: Fall, Other (comment) Precaution Comments: R LE wound vac Restrictions Weight Bearing Restrictions: Yes RLE Weight Bearing: Non weight bearing Other Position/Activity Restrictions: RLE  Therapy/Group: Individual Therapy Martin Majestic PT, DPT  08/05/2022, 7:04 AM

## 2022-08-05 NOTE — Progress Notes (Signed)
Pt bleeding from [place] in LUE. Nurse applied pressure dressing, Dan PA notified, Verbal order to hold Lovenox until further notice. Information passed on to assigned nurse.

## 2022-08-05 NOTE — Progress Notes (Signed)
Patient ID: [name], male   DOB: 03/22/1951, 72 y.o.   MRN: [id number]  Sw made attempt to call pt spouse, [name] to arrange family education. Sw will wait for FU.

## 2022-08-06 LAB — RENAL FUNCTION PANEL
Albumin: 3.1 g/dL — ABNORMAL LOW (ref 3.5–5.0)
Anion gap: 14 (ref 5–15)
BUN: 62 mg/dL — ABNORMAL HIGH (ref 8–23)
CO2: 25 mmol/L (ref 22–32)
Calcium: 8.1 mg/dL — ABNORMAL LOW (ref 8.9–10.3)
Chloride: 94 mmol/L — ABNORMAL LOW (ref 98–111)
Creatinine, Ser: 8.78 mg/dL — ABNORMAL HIGH (ref 0.61–1.24)
GFR, Estimated: 6 mL/min — ABNORMAL LOW (ref >60)
Glucose, Bld: 171 mg/dL — ABNORMAL HIGH (ref 70–99)
Phosphorus: 4.5 mg/dL (ref 2.5–4.6)
Potassium: 5.8 mmol/L — ABNORMAL HIGH (ref 3.5–5.1)
Sodium: 133 mmol/L — ABNORMAL LOW (ref 135–145)

## 2022-08-06 LAB — CBC
HCT: 27.4 % — ABNORMAL LOW (ref 39.0–52.0)
Hemoglobin: 9.1 g/dL — ABNORMAL LOW (ref 13.0–17.0)
MCH: 30 pg (ref 26.0–34.0)
MCHC: 33.2 g/dL (ref 30.0–36.0)
MCV: 90.4 fL (ref 80.0–100.0)
Platelets: 133 10*3/uL — ABNORMAL LOW (ref 150–400)
RBC: 3.03 MIL/uL — ABNORMAL LOW (ref 4.22–5.81)
RDW: 17.5 % — ABNORMAL HIGH (ref 11.5–15.5)
WBC: 7.5 10*3/uL (ref 4.0–10.5)
nRBC: 0 % (ref 0.0–0.2)

## 2022-08-06 MED ORDER — PENTAFLUOROPROP-TETRAFLUOROETH EX AERO: 1.0000 | INHALATION_SPRAY | CUTANEOUS | Status: DC | PRN

## 2022-08-06 MED ORDER — LIDOCAINE-PRILOCAINE 2.5-2.5 % EX CREA: 1.0000 | TOPICAL_CREAM | CUTANEOUS | Status: DC | PRN

## 2022-08-06 MED ORDER — LIDOCAINE HCL (PF) 1 % IJ SOLN: 5.0000 mL | INTRAMUSCULAR | Status: DC | PRN

## 2022-08-06 MED ORDER — ALTEPLASE 2 MG IJ SOLR: 2.0000 mg | Freq: Once | INTRAMUSCULAR | Status: DC | PRN

## 2022-08-06 MED ORDER — ANTICOAGULANT SODIUM CITRATE 4% (200MG/5ML) IV SOLN: 5.0000 mL | Status: DC | PRN

## 2022-08-06 MED ORDER — PREGABALIN 50 MG PO CAPS
50.0000 mg | ORAL_CAPSULE | Freq: Every day | ORAL | Status: DC
  Administered 2022-08-06 – 2022-08-10 (×5): 50 mg via ORAL
  Filled 2022-08-06 (×5): qty 1

## 2022-08-06 MED ORDER — HEPARIN SODIUM (PORCINE) 1000 UNIT/ML DIALYSIS: 1000.0000 [IU] | INTRAMUSCULAR | Status: DC | PRN

## 2022-08-06 NOTE — Progress Notes (Signed)
Physical Therapy Session Note  Patient Details  Name: [name] MRN: [id number] Date of Birth: 23-Nov-1950  Today's Date: 08/06/2022 PT Individual Time: [phone] and [id number] PT Individual Time Calculation (min): 71 min and 43 min  Short Term Goals: Week 1:  PT Short Term Goal 1 (Week 1): Pt will completed sit to stand from [facility] with ModA + LRAD PT Short Term Goal 2 (Week 1): Pt will maintain static standing balance with CGA + LRAD PT Short Term Goal 3 (Week 1): Pt will initiate SPT practice with LRAD  Skilled Therapeutic Interventions/Progress Updates:   Treatment Session 1 Received pt sitting in WC, pt agreeable to PT treatment, and did not state pain level. Session with emphasis on functional mobility/transfers, generalized strengthening and endurance, WC mobility, and D/C planning. Donned R limb guard with mod A and pt performed WC mobility >186ft using BUE and supervision with emphasis on UE strength - transported remainder of way to rehab apartment in [facility] dependently. Pt reports his bed at home is 30in high and he has a bed assist rail. Educated pt on transfer set up technique and WC positioning and pt performed x 2 squat/stand<>pivot transfers on/off 30in high bed pulling on bed assist rail. Pt required mod A to get onto bed and CGA/close supervision when coming back down into [facility] and assist from therapist to stabilize WC and bed assist rail - cues to avoid "plopping".   Pt with questions regarding using Corene Cornea to maneuver in/out of water closet in bathroom - measured Stedy (24in) and bathroom door width 23in - encouraged pt to discuss further with primary OT. Pt reported sitting in recliner for dialysis and to watch TV at home - therefore practiced furniture transfer on/off rocking recliner via squat<>pivot with min A overall and therapist stabilizing recliner and WC from moving - min cues for transfer set up. Pt continues to have difficulty with eccentric control when sitting. Placed  request to order R amputee support pad for pt's WC at home. Pt removed prosthetic due to it fitting incorrectly and noticed 3 new red areas of irritation and pressure (pt reported not being able to feel them) - reached out to [employer] to inspect limb and encouraged pt to take prosthetic off in between therapy sessions. Transported back to room in [facility] dependently and concluded session with pt sitting in [facility] with all needs within reach.   Treatment Session 2 Received pt sitting in WC, pt agreeable to PT treatment, and reported pain in R residual limb but did not rate severity. Session with emphasis on functional mobility/transfers, generalized strengthening and endurance, and dynamic standing balance/coordination. Removed prosthetic and applied foam dressings to cover red pressure areas on L residual limb - notified treatment team. Pt transported to/from room in [facility] dependently for time management purposes. Pt transferred WC<>25in high mat via squat<>pivot with CGA and therpaist stabilizing WC. While sitting unsupported, pt performed 2x10 overhead press<>horizontal press with 3lb dowel to warm up UEs. Pt then stood from 21in high EOM with RW and mod A x 2 trials and performed R hip flexion x8 and R hip abduction x8 with CGA for balance. Pt stood again from [facility] with RW and mod A and performed stand<>pivot transfer to R with min A, demonstrating improvements in ability to move prosthetic leg, however unable to "hop" backwards - requiring assist from therapist to bring [facility] behind him to sit. Pt performed WC push ups x 5 reps, then returned to room and concluded session with  pt sitting in WC with all needs within reach.   Therapy Documentation Precautions:  Precautions Precautions: Fall, Other (comment) Precaution Comments: R LE wound vac Restrictions Weight Bearing Restrictions: Yes RLE Weight Bearing: Non weight bearing Other Position/Activity Restrictions: RLE  Therapy/Group: Individual Therapy Martin Majestic PT, DPT  08/06/2022, 6:50 AM

## 2022-08-06 NOTE — Progress Notes (Signed)
Received patient in bed.Awake alert and oriented x 4.  Access used :Left upper arm AVF ,that worked well.  Duration of treatment: 3.5 hours.  Fluid removed : 2 liters.  Hemo issue/comment: Tolerated treatment well.  Hand off to the patient's nurse.

## 2022-08-06 NOTE — Procedures (Signed)
   I was present at this dialysis session, have reviewed the session itself and made  appropriate changes Vinson Moselle MD [facility] pager [phone]   08/06/2022, 4:08 PM

## 2022-08-06 NOTE — Progress Notes (Signed)
Occupational Therapy Weekly Progress Note  Patient Details  Name: [name] MRN: [id number] Date of Birth: 1951-03-04  Beginning of progress report period: July 30, 2022 End of progress report period: August 06, 2022  Patient has met 5 of 5 short term goals. Pt is making great progress towards LTGs. He is able to bathe at an overall supervision level, dress with set up A for UB, min A for LB and requires up to mod assist for toileting tasks. Pt continues to demonstrate endurance, static standing and dynamic balance and coordination deficits resulting in difficulty completing BADL tasks without increased physical assist.One of the greatest barriers to DC is patients inaccessibility via w/c in his bathroom, therefore pt will benefit from continued skilled OT services to focus on mentioned deficits and to determine DC plans prior to returning home with his wife. Family ed is scheduled for the end of the week.   Patient continues to demonstrate the following deficits: muscle weakness, decreased cardiorespiratoy endurance, decreased coordination, and decreased standing balance and decreased balance strategies and therefore will continue to benefit from skilled OT intervention to enhance overall performance with BADL, iADL, and Reduce care partner burden.  Patient progressing toward long term goals..  Continue plan of care.  OT Short Term Goals Week 1:  OT Short Term Goal 1 (Week 1): Patient will complete stand pivot or squat pivot transfer to toilet/commode with min assist OT Short Term Goal 1 - Progress (Week 1): Met OT Short Term Goal 2 (Week 1): Patient will complete toilet hygiene with mod assist OT Short Term Goal 2 - Progress (Week 1): Met OT Short Term Goal 3 (Week 1): Patient will dress lower body with mod assist OT Short Term Goal 3 - Progress (Week 1): Met OT Short Term Goal 4 (Week 1): Patient will don/doff Leff prostheses with set up assistance in preparation for functional  transfers OT Short Term Goal 4 - Progress (Week 1): Met OT Short Term Goal 5 (Week 1): Patient will don pull over shirt without physical assistance OT Short Term Goal 5 - Progress (Week 1): Met Week 2:  OT Short Term Goal 1 (Week 2): STG = LTG due to ELOS   [name] E Aaliah Jorgenson, MS, OTR/L  08/06/2022, 7:58 AM

## 2022-08-06 NOTE — Progress Notes (Signed)
PROGRESS NOTE   Subjective/Complaints: Continues to feel some phantom limb pain but much better than before. Has not needed oxycodone. Would like to increase nighttime lyrica.    ROS:    Pt denies SOB, abd pain, CP, N/V/C/D, and vision changes, +phantom limb pain, +red areas on left residual limb  Objective:   No results found. Recent Labs    08/03/22 1630  WBC 9.8  HGB 10.3*  HCT 30.2*  PLT 169   Recent Labs    08/03/22 1630  NA 127*  K 4.0  CL 89*  CO2 23  GLUCOSE 93  BUN 47*  CREATININE 7.99*  CALCIUM 8.1*    Intake/Output Summary (Last 24 hours) at 08/06/2022 1059 Last data filed at 08/06/2022 0810 Gross per 24 hour  Intake 820 ml  Output 100 ml  Net 720 ml        Physical Exam: Vital Signs Blood pressure (!) 154/60, pulse 72, temperature 98 F (36.7 C), temperature source Oral, resp. rate 18, height (P) 4\' 5"  (1.346 m), weight 83 kg, SpO2 97 %.  Gen: no distress, normal appearing HEENT: oral mucosa pink and moist, NCAT Cardio: Reg rate Chest: normal effort, normal rate of breathing Abd: soft, non-distended Ext: no edema Psych: pleasant, normal affect Skin: VAC off BKA- R BKA bulbous with shrinker in place- no drainage out of shrinker seen, red areas on left residual limb Neurologic: Cranial nerves II through XII intact, motor strength is 4/5 in bilateral deltoid, bicep, tricep, grip, hip flexor, L knee extensors, RIght knee ext not tested due to Wound vac, bilateral distal testing deferred due to bilateral BKA Sensory exam normal sensation to light touch except for left little finger sensation in bilateral upper  Musculoskeletal: Full range of motion bilateral upper extremities bilateral hand intrinsic minus deformities. Right BKA with wound VAC limiting examination, left knee range of motion appears to be within functional limits wearing left BKA prosthetic Straps on prosthesis coming off      Assessment/Plan: 1. Functional deficits which require 3+ hours per day of interdisciplinary therapy in a comprehensive inpatient rehab setting. Physiatrist is providing close team supervision and 24 hour management of active medical problems listed below. Physiatrist and rehab team continue to assess barriers to discharge/monitor patient progress toward functional and medical goals  Care Tool:  Bathing    Body parts bathed by patient: Face, Right arm, Left arm, Chest, Abdomen, Right upper leg, Left upper leg, Front perineal area, Buttocks   Body parts bathed by helper: Buttocks, Front perineal area Body parts n/a: Right lower leg, Left lower leg   Bathing assist Assist Level: Supervision/Verbal cueing     Upper Body Dressing/Undressing Upper body dressing   What is the patient wearing?: Pull over shirt    Upper body assist Assist Level: Set up assist    Lower Body Dressing/Undressing Lower body dressing      What is the patient wearing?: Underwear/pull up, Pants, Orthosis (L prosthesis)     Lower body assist Assist for lower body dressing: Moderate Assistance - Patient 50 - 74%     Toileting Toileting    Toileting assist Assist for toileting: Maximal Assistance - Patient  25 - 49%     Transfers Chair/bed transfer  Transfers assist     Chair/bed transfer assist level: Minimal Assistance - Patient > 75% (stand<>pivot)     Locomotion Ambulation   Ambulation assist   Ambulation activity did not occur: Safety/medical concerns (2/2 pt fatigue and LLE prosthetic stability)          Walk 10 feet activity   Assist  Walk 10 feet activity did not occur: Safety/medical concerns (2/2 pt fatigue and LLE prosthetic stability)        Walk 50 feet activity   Assist Walk 50 feet with 2 turns activity did not occur: Safety/medical concerns (2/2 pt fatigue and LLE prosthetic stability)         Walk 150 feet activity   Assist Walk 150 feet activity did  not occur: Safety/medical concerns (2/2 pt fatigue and LLE prosthetic stability)         Walk 10 feet on uneven surface  activity   Assist Walk 10 feet on uneven surfaces activity did not occur: Safety/medical concerns (2/2 pt fatigue and LLE prosthetic stability)         Wheelchair     Assist Is the patient using a wheelchair?: Yes Type of Wheelchair: Manual    Wheelchair assist level: Supervision/Verbal cueing Max wheelchair distance: 150    Wheelchair 50 feet with 2 turns activity    Assist        Assist Level: Supervision/Verbal cueing   Wheelchair 150 feet activity     Assist      Assist Level: Supervision/Verbal cueing   Blood pressure (!) 154/60, pulse 72, temperature 98 F (36.7 C), temperature source Oral, resp. rate 18, height (P) 4\' 5"  (1.346 m), weight 83 kg, SpO2 97 %.  Medical Problem List and Plan: 1. Functional deficits secondary to right BKA secondary to abscess 07/26/2022 with wound VAC per Dr. Lajoyce Corners as well as history of left BKA 05/09/2021.  Patient does have a prosthesis           Con't CIR PT, and PT- limb guard and shrinker to RLE- wearing LLE prosthesis             -ELOS/Goals: 12-14d, Sup/minA level goals  2.  Antithrombotics: -DVT/anticoagulation:  Pharmaceutical: Heparin             -antiplatelet therapy: N/A 3. Residual limb pain: N/A Oxycodone d/ced  4/27- Pt insists that Oxy be restarted, since didn't make any difference in itching- itching likely due to HD patient.   4/28- pain was doing better since restarted Oxy. Worse this AM, since went too long between meds 4. Mood/Behavior/Sleep: Trazodone nightly as needed             -antipsychotic agents: N/A 5. Neuropsych/cognition: This patient is capable of making decisions on his own behalf. 6. Skin/Wound Care: Routine skin checks and left BKA wound VAC may be removed 08/02/2022 7. Fluids/Electrolytes/Nutrition: Routine in and outs with follow-up chemistries 8.  End-stage  renal disease.  Continue hemodialysis as directed 9.  Diabetes mellitus.  Latest hemoglobin A1c  5.0.  d/c SSI. Provided list of foods for good blood pressure control  4/27- BG's well controlled- con't regimen 10.  Acute on chronic anemia.  Continue Aranesp 11.  Hypothyroidism.  Synthroid 12.  Hyperlipidemia.  Crestor 13.  CAD/CABG 2008 as well as history of atrial flutter with ablation 04/06/2020.  Follow-up outpatient with Dr. Ladona Ridgel 14.  History of prostate cancer TURP.  Follow-up outpatient 15.  Hypertension.  Monitor with increased mobility.  Patient had been on Coreg 12.5 mg twice daily, Cozaar 50 mg twice daily prior to admission on hold due to blood pressure being soft.  BP reviewed and continues to be soft so will hold 16. Difficulty swallowing pills: SLP consulted, vitamin C supplement d/ced since it is too large to swallow 17. Fatigue/daytime somnolence: continue modafinil to 200mg  daily 18. Straps on prosthesis coming off: appreciate [employer] eval 19. Dry eyes: eye drops started 20. Loose stools: discussed resolution with one time dose of imodium 21. Phantom limb pain: Lyrica increased to 25mg  TID and 50mg  HS,  Zanaflex added 22. Hyponatremia: continue to monitor with HD labs  23. Red areas on left residual limb: [employer] notified  >50 minutes spent in discussion with PT regarding red areas on left residual limb and that [employer] was notified, discussing phantom limb pain with patient and increasing lyrica to 50mg  HS    LOS: 8 days A FACE TO FACE EVALUATION WAS PERFORMED  Clint Bolder P Ayron Fillinger 08/06/2022, 10:59 AM

## 2022-08-06 NOTE — Progress Notes (Signed)
Occupational Therapy Session Note  Patient Details  Name: [name] MRN: [id number] Date of Birth: 1950/10/31  Today's Date: 08/06/2022 OT Individual Time: 1050-1200 OT Individual Time Calculation (min): 70 min    Short Term Goals: Week 1:  OT Short Term Goal 1 (Week 1): Patient will complete stand pivot or squat pivot transfer to toilet/commode with min assist OT Short Term Goal 2 (Week 1): Patient will complete toilet hygiene with mod assist OT Short Term Goal 3 (Week 1): Patient will dress lower body with mod assist OT Short Term Goal 4 (Week 1): Patient will don/doff Leff prostheses with set up assistance in preparation for functional transfers OT Short Term Goal 5 (Week 1): Patient will don pull over shirt without physical assistance  Skilled Therapeutic Interventions/Progress Updates:  Skilled OT intervention completed with focus on BUE strengthening. Pt received seated in w/c, agreeable to session. No pain reported.  Pt declined self-care needs including an offer for a shower. Requested to work on [employer]. Pt self-propelled in w/c > main gym about 150 ft with mod I.  Pt was able to manage w/c positioning next to EOM, however required cues to attend to removal of at least R leg rest prior to transferring. Doffed R leg rest and swiveled L one independently. Then supervision lateral scoot to EOM.   Pt completed the following BUE exercises to promote strength needed for functional transfers and BADLs: (With red theraband) x12 reps Horizontal abduction Self-anchored shoulder flexion each arm Self-anchored bicep flexion each arm Self-anchored tricep extension each arm Therapist anchored scapular retraction each arm Alternating chest presses Shoulder external rotation Shoulder diagonal pulls  Doffed R limb guard independently, then transitioned to supine with bolster/pillow under head for comfort with overall mod I. Completed the following residual limb  exercises/stretches for contracture prevention and strengthening of the remaining leg for dynamic balance: (Supine) -with R limb propped on 2 towels, x12 knee extension with 3 sec hold. Pt able to achieve full knee extension (L side lying) x12 -hip abduction -hip extension -circumduction forwards then backwards  Transitioned back to EOM with mod I, donned limb guard with min A for time, then supervision lateral scoot to w/c. Donned both leg rests total A for time. Dependent transport back to room, then pt remained seated in w/c, with all needs in reach at end of session.   Therapy Documentation Precautions:  Precautions Precautions: Fall Restrictions Weight Bearing Restrictions: Yes RLE Weight Bearing: Non weight bearing    Therapy/Group: Individual Therapy  Melvyn Novas, MS, OTR/L  08/06/2022, 12:06 PM

## 2022-08-07 MED ORDER — CHLORHEXIDINE GLUCONATE CLOTH 2 % EX PADS
6.0000 | MEDICATED_PAD | Freq: Every day | CUTANEOUS | Status: DC
  Administered 2022-08-07 – 2022-08-11 (×3): 6 via TOPICAL

## 2022-08-07 MED ORDER — HYDROCORTISONE 0.5 % EX CREA
TOPICAL_CREAM | Freq: Two times a day (BID) | CUTANEOUS | Status: DC
  Filled 2022-08-07 (×2): qty 28.35

## 2022-08-07 NOTE — Progress Notes (Signed)
Occupational Therapy Session Note  Patient Details  Name: [name] MRN: [id number] Date of Birth: 1950/09/08  Today's Date: 08/07/2022 OT Individual Time: 1020-1130 & 1434-1530 OT Individual Time Calculation (min): 70 min  & 56 min   Short Term Goals: Week 2:  OT Short Term Goal 1 (Week 2): STG = LTG due to ELOS  Skilled Therapeutic Interventions/Progress Updates:  Session 1 Skilled OT intervention completed with focus on ADL retraining at the shower level, functional transfers and limb care. Pt received seated in w/c, agreeable to session. 3/10 pain reported in R residual limb; pre-medicated. OT offered rest breaks, repositioning and moist heat via shower for pain reduction.  Pt agreeable to shower. Able to gather all clothing with mod I at w/c level. Self-propelled in w/c to tub bench min A for positioning. Cues needed to wait to doff L prosthetic until after transfer for efficiency. Supervision squat pivot using grab bar to TTB. Doffed prosthetic and all clothing with mod I.  Waterproof cover applied to R limb prior to shower. Pt was able to bathe at the set up A level, seated only. Mod A needed to lateral scoot to w/c due to not donning L prosthetic.   Upon removal of multiple foam dressings on L residual limb, noted to have irritated red spots a bit bigger than a quarter size, with additional spots noted on the back of L thigh. Notified nursing for assessment, along with Kathlene November present from [place] by happenstance, with report that the spots appear to be topical dermatitis due to pt's history of psoriasis with suggestion for topical steroid to assist vs bandages. MD made aware of order request, but in the meantime, OT applied anti-itch cream to L limb to assist with dry irritation.   Doffed R limb shrinker, with no drainage present, therefore redonned 3X directly onto skin with mod A, per Dr. Lajoyce Corners protocol. Threaded LB clothing with supervision, then utilized series of w/c push ups for min  A scooping of pants under buttocks. Mod I for donning shirt. Encouraged pt to allow L limb to dry after lotion application prior to donning sleeve/prosthesis independently. Pt remained seated in w/c, with chair alarm on/activated, and with all needs in reach at end of session.  Session 2 Skilled OT intervention completed with focus on DC planning, DME education, functional transfers and dynamic balance. Pt received seated in w/c, agreeable to session. 3/10 pain reported in R residual limb; pre-medicated. OT offered rest breaks, repositioning and elevation throughout for pain reduction.  Pt verbalized his plans for using a chair off of [employer] to transfer into to then enter bathroom that has narrow door way. OT was able to pull it up online, however when looking at the details, though the 21.5 in width would be great for his 23 in door width, the arm rests on the "office chair with big w/c wheels" do not lift up in prep for squat pivot transfer. Educated pt that he is safer/more independent when squat pivoting <> surfaces when going to level surfaces as this was this his barrier with personal light weight chair that didn't have removable arm rests and he needed mod A which his wife is unable to do. Advised that unless he can confirm arm rests that lift up on the chair in question, then he needs to install grab bar on side wall and squat pivot to the free standing commode as originally planned. Wife present at end of session; OT reviewed extensively all of the above  info with her as well and plans to confirm details by Friday education.   Self-propelled in w/c with mod I <> gym. Supervision squat pivot to EOM with independent management of w/c positioning/brakes/leg rests.   Pt then participated in 3 trials of cornhole toss in standing, requiring mod fading to light min A to stand using RW, and min A to maintain dynamic balance. Intermittent rest breaks needed for fatigue. Supervision squat pivot back to w/c.  Total A donning of leg rests.  Back in room, pt remained seated in w/c, with wife present and with all needs in reach at end of session.   Therapy Documentation Precautions:  Precautions Precautions: Fall Restrictions Weight Bearing Restrictions: Yes RLE Weight Bearing: Non weight bearing Other Position/Activity Restrictions: RLE    Therapy/Group: Individual Therapy  Melvyn Novas, MS, OTR/L  08/07/2022, 3:46 PM

## 2022-08-07 NOTE — Plan of Care (Signed)
  Problem: Sit to Stand Goal: LTG:  Patient will perform sit to stand with assistance level (PT) Description: LTG:  Patient will perform sit to stand with assistance level (PT) Flowsheets (Taken 08/07/2022 1207) LTG: PT will perform sit to stand in preparation for functional mobility with assistance level: (upgraded due to improved strength/endurance) Contact Guard/Touching assist Note: upgraded due to improved strength/endurance    Problem: RH Bed to Chair Transfers Goal: LTG Patient will perform bed/chair transfers w/assist (PT) Description: LTG: Patient will perform bed to chair transfers with assistance (PT). Flowsheets (Taken 08/07/2022 1207) LTG: Pt will perform Bed to Chair Transfers with assistance level: (upgraded due to improved strength/endurance) Supervision/Verbal cueing Note: upgraded due to improved strength/endurance    Problem: RH Car Transfers Goal: LTG Patient will perform car transfers with assist (PT) Description: LTG: Patient will perform car transfers with assistance (PT). Flowsheets (Taken 08/07/2022 1207) LTG: Pt will perform car transfers with assist:: (upgraded due to improved strength/endurance) Supervision/Verbal cueing Note: upgraded due to improved strength/endurance

## 2022-08-07 NOTE — Progress Notes (Signed)
Patient ID: [name], male   DOB: 11-Jan-1951, 72 y.o.   MRN: [id number]  R amputee pad ordered through [employer].

## 2022-08-07 NOTE — Plan of Care (Signed)
Goals updated (both upgraded and downgraded) to reflect achievable goals by DC date  Problem: RH Balance Goal: LTG Patient will maintain dynamic standing with ADLs (OT) Description: LTG:  Patient will maintain dynamic standing balance with assist during activities of daily living (OT)  Flowsheets (Taken 08/07/2022 1200) LTG: Pt will maintain dynamic standing balance during ADLs with: (downgraded due to altered balance strategies) Contact Guard/Touching assist   Problem: Sit to Stand Goal: LTG:  Patient will perform sit to stand in prep for activites of daily living with assistance level (OT) Description: LTG:  Patient will perform sit to stand in prep for activites of daily living with assistance level (OT) Flowsheets (Taken 08/07/2022 1200) LTG: PT will perform sit to stand in prep for activites of daily living with assistance level: (Downgraded due to altered balance strategies and increased assist needed) Contact Guard/Touching assist   Problem: RH Bathing Goal: LTG Patient will bathe all body parts with assist levels (OT) Description: LTG: Patient will bathe all body parts with assist levels (OT) Flowsheets (Taken 08/07/2022 1200) LTG: Pt will perform bathing with assistance level/cueing: (Upgraded due to improved endurance, awareness, and coordination) Independent with assistive device    Problem: RH Toileting Goal: LTG Patient will perform toileting task (3/3 steps) with assistance level (OT) Description: LTG: Patient will perform toileting task (3/3 steps) with assistance level (OT)  Flowsheets (Taken 08/07/2022 1200) LTG: Pt will perform toileting task (3/3 steps) with assistance level: (Upgraded due to improved endurance, awareness, and coordination) Contact Guard/Touching assist

## 2022-08-07 NOTE — Progress Notes (Signed)
Contacted [facility] and spoke to [place], [place]. Clinic aware pt will d/c to home on Sunday and will resume home HD at that time. Pt's wife had called clinic staff to make them aware of plan.   Olivia Canter Renal Navigator [phone]

## 2022-08-07 NOTE — Progress Notes (Signed)
[facility] Progress Note   Subjective:   Had HD yesterday with 2L UF. Reports it went better but fistula is still sore today. No bleeding. Denies SOB, CP, dizziness, nausea.  Objective Vitals:   08/06/22 1821 08/06/22 1859 08/06/22 1924 08/07/22 0644  BP: (!) 173/68 (!) 159/76 (!) 178/70 (!) 152/64  Pulse: 74  72 76  Resp:   17 18  Temp: 98.3 F (36.8 C)  98.2 F (36.8 C) 98.5 F (36.9 C)  TempSrc:   Oral Oral  SpO2: 97%  98% 98%  Weight:      Height:       Physical Exam General: Alert male in NAD Heart: RRR, no murmurs, rubs or gallops Lungs: CTA bilaterally, respirations unlabored on RA Abdomen: Soft, non-distended, +BS Extremities: B/l BKA, no edema noted Dialysis Access:  LUE AVF with clean appearing bandage, + bruit  Additional Objective Labs: Basic Metabolic Panel: Recent Labs  Lab 08/01/22 1438 08/03/22 1630 08/06/22 1200  NA 131* 127* 133*  K 3.7 4.0 5.8*  CL 94* 89* 94*  CO2 24 23 25   GLUCOSE 134* 93 171*  BUN 48* 47* 62*  CREATININE 8.04* 7.99* 8.78*  CALCIUM 8.3* 8.1* 8.1*  PHOS 4.4 5.7* 4.5   Liver Function Tests: Recent Labs  Lab 08/01/22 1438 08/03/22 1630 08/06/22 1200  ALBUMIN 3.0* 3.2* 3.1*   No results for input(s): "LIPASE", "AMYLASE" in the last 168 hours. CBC: Recent Labs  Lab 08/01/22 1438 08/03/22 1630 08/06/22 1200  WBC 6.9 9.8 7.5  HGB 9.0* 10.3* 9.1*  HCT 27.2* 30.2* 27.4*  MCV 89.8 87.5 90.4  PLT 158 169 133*   Blood Culture    Component Value Date/Time   SDES  07/22/2022 1835    BLOOD BLOOD RIGHT ARM Performed at [facility], [address] [place], [place] [address]    [facility]  07/22/2022 1835    Blood Culture adequate volume BOTTLES DRAWN AEROBIC AND ANAEROBIC Performed at [facility], [address], [place], [place] [address]    CULT  07/22/2022 1835    NO GROWTH 5 DAYS Performed at [facility], [address], [place], [place]  [address]    REPTSTATUS 07/27/2022 FINAL 07/22/2022 1835    Cardiac Enzymes: No results for input(s): "CKTOTAL", "CKMB", "CKMBINDEX", "TROPONINI" in the last 168 hours. CBG: Recent Labs  Lab 08/03/22 1636 08/04/22 0600 08/04/22 1120 08/04/22 1706 08/05/22 0557  GLUCAP 81 110* 100* 110* 103*   Iron Studies: No results for input(s): "IRON", "TIBC", "TRANSFERRIN", "FERRITIN" in the last 72 hours. @lablastinr3 @ Studies/Results: No results found. Medications:   carvedilol  12.5 mg Oral BID WC   Chlorhexidine Gluconate Cloth  6 each Topical Q0600   darbepoetin (ARANESP) injection - DIALYSIS  100 mcg Subcutaneous Q Sat-1800   enoxaparin (LOVENOX) injection  30 mg Subcutaneous Q24H   levothyroxine  112 mcg Oral Q0600   modafinil  200 mg Oral Daily   pantoprazole  40 mg Oral QHS   pregabalin  25 mg Oral TID   pregabalin  50 mg Oral QHS   rosuvastatin  10 mg Oral Daily   zinc sulfate  220 mg Oral Daily    Dialysis Orders: [facility] (Dr. Signe Colt follows)- Sun - Wed - Fri 3:48hr, BFR 425, EDW 88kg, L AVF, no heparin - Venofer 100mg  IV weekly.    Assessment/Plan: 1. RLE cellulitis:  right BKA 4/19 Dr. Lajoyce Corners, had wound VAC, antibiotics complete/CIR admit  2. Acute Hypoxic Resp Failure: Admit  intubated / extubated 4/16 /stable now on room air.  UF as tolerated 3. ESRD: HHD / in hosp TTS schedule while here. Next HD tomorrow. Noted K+ 5.8 prior to HD, had been eating potato chips/bananas in his room, discussed cutting back on high K+ foods.  4. HTN/volume: BP stable on HD, variable bed weights - 2L UFG. Will need a new EDW s/p amputation. (Suspect last weight was incorrect) 5. Anemia: s/p 2 units PRBCs 4/18. Aranesp started 4/18. Stable in the 9-10s 6. Secondary hyperparathyroidism:  CorrCa controlled, not on VDRA. Phos at goal, not taking binder here.  7. Nutrition: Albumin improving. On protein supplements. on protein supp  8. T2DM: per admitting team  Rogers Blocker,  PA-C 08/07/2022, 9:12 AM  [facility] Pager: [phone]

## 2022-08-07 NOTE — Progress Notes (Signed)
Patient ID: [name], male   DOB: [birth year], 72 y.o.   MRN: [id number]  Team Conference Report to Patient/Family  Team Conference discussion was reviewed with the patient and caregiver, including goals, any changes in plan of care and target discharge date.  Patient and caregiver express understanding and are in agreement.  The patient has a target discharge date of 08/11/22.  SW met with patient met with patient and provided team conference updates. Pt has confirmed that his spouse has been able to obtain a male aide. Spouse and aide plan to attend family education on Friday. R amputee pad ordered through [employer]. No additional questions or concerns.   Andria Rhein 08/07/2022, 2:24 PM

## 2022-08-07 NOTE — Progress Notes (Signed)
Physical Therapy Weekly Progress Note  Patient Details  Name: [name] MRN: [id number] Date of Birth: 23-Jul-1950  Beginning of progress report period: July 30, 2022 End of progress report period: Aug 07, 2022  Today's Date: 08/07/2022 PT Individual Time: 4098-1191 PT Individual Time Calculation (min): 55 min   Patient has met 2 of 3 short term goals. Pt demonstrates steady progress towards long term goals. Pt is currently able to perform bed mobility with supervision from flat surface with increased time, squat<>pivot transfers with CGA, sit<>stand and stand<>pivot transfers from elevated surfaces with RW and min A, and WC mobility >162ft using BUE and supervision. Pt continues to be limited by phantom pain in R residual limb, generalized weakness/deconditioning, and decreased balance strategies. Family educated scheduled for 5/3 with wife and caregiver and goals upgraded to supervision overall.   Patient continues to demonstrate the following deficits muscle weakness and muscle joint tightness, decreased cardiorespiratoy endurance, and decreased standing balance, decreased postural control, decreased balance strategies, and difficulty maintaining precautions and therefore will continue to benefit from skilled PT intervention to increase functional independence with mobility.  Patient progressing toward long term goals..  Continue plan of care.  PT Short Term Goals Week 1:  PT Short Term Goal 1 (Week 1): Pt will completed sit to stand from [facility] with ModA + LRAD PT Short Term Goal 1 - Progress (Week 1): Progressing toward goal PT Short Term Goal 2 (Week 1): Pt will maintain static standing balance with CGA + LRAD PT Short Term Goal 2 - Progress (Week 1): Met PT Short Term Goal 3 (Week 1): Pt will initiate SPT practice with LRAD PT Short Term Goal 3 - Progress (Week 1): Met Week 2:  PT Short Term Goal 1 (Week 2): STG=LTG due to LOS  Skilled Therapeutic Interventions/Progress Updates:   Ambulation/gait training;Balance/vestibular training;Cognitive remediation/compensation;Discharge planning;Disease management/prevention;DME/adaptive equipment instruction;Functional mobility training;Pain management;Patient/family education;Psychosocial support;Therapeutic Activities;Skin care/wound management;Therapeutic Exercise;UE/LE Strength taining/ROM;UE/LE Coordination activities;Wheelchair propulsion/positioning   Today's Interventions: Received pt sitting in WC, pt agreeable to PT treatment, and reported pain 5-6/10 in R residual limb (premedicated) - pt much more limited by pain today. Reviewed desensitization techniques for pain relief. Session with emphasis on functional mobility/transfers, generalized strengthening and endurance, and dynamic standing balance/coordination. Donned R limb guard with max A and pt transported to/from room in [facility] dependently for time management purposes. Pt reports his wife found a 21in wide WC to use temporarily to get in/out of water closet - plans to have wife show pictures on Friday. Pt performed x 2 squat<>pivot transfers to/from mat with CGA towards L. Stood from [facility] with RW and min A x 3 trials and worked on Librarian, academic horseshoes with LUE x 3 trials with min A for balance. Removed limb guard and performed the following exercises with emphasis on strength/ROM: - knee extension 2x10 bilaterally with 3lb ankle weight on LLE - hip flexion 2x10 bilaterally with 3lb ankle weight on LLE - lateral trunk rotations with 3.3lb medicine ball x10 bilaterally Donned limb guard with max A and returned to WC. Returned to room and pt transferred WC<>toilet via squat<>pivot using grab bar with CGA. Pt required min A to remove pants/underwear via lateral leans. Pt left seated on toilet in care of NT due to time restrictions.   Therapy Documentation Precautions:  Precautions Precautions: Fall Precaution Comments: R LE wound vac Restrictions Weight  Bearing Restrictions: Yes RLE Weight Bearing: Non weight bearing Other Position/Activity Restrictions: RLE  Therapy/Group: Individual Therapy  Marlana Salvage Imagene Gurney PT, DPT  08/07/2022, 7:24 AM

## 2022-08-07 NOTE — Patient Care Conference (Signed)
Inpatient RehabilitationTeam Conference and Plan of Care Update Date: 08/07/2022   Time: 11:42 AM    Patient Name: [name]      Medical Record Number: [id number]  Date of Birth: 1950/06/01 Sex: Male         Room/Bed: 4W25C/4W25C-01 Payor Info: Payor: MEDICARE / Plan: MEDICARE PART A AND B / Product Type: *No Product type* /    Admit Date/Time:  07/29/2022  6:43 PM  Primary Diagnosis:  Right below-knee amputee [facility])  Hospital Problems: Principal Problem:   Right below-knee amputee [facility])    Expected Discharge Date: Expected Discharge Date: 08/11/22  Team Members Present: Physician leading conference: Dr. Sula Soda Social Worker Present: Lavera Guise, BSW Nurse Present: Chana Bode, RN PT Present: Raechel Chute, PT OT Present: Candee Furbish, OT PPS Coordinator present : Fae Pippin, SLP     Current Status/Progress Goal Weekly Team Focus  Bowel/Bladder   Continent of b/b   Remain continent   Assist with toileting as needed    Swallow/Nutrition/ Hydration   No SLP needs   N/A  N/A    ADL's   Set up A UB bathing/dressing, Supervision LB bathing, Min A LB dressing, Min A toileting   Min A ADLs, supervision transfers; will need to upgrade if not DC sooner   ADL retraining, balance, sit > stands, family ed, endurance    Mobility   bed mobility supervision, sit<>stands from elevated surfaces with RW min A, squat<>pivot transfers CGA, stand<>pivot transfers min A, WC mobility 144ft supervision.   min A, mod I WC mobility - will need to upgrade  functional mobility/transfers, generalized strengthening and endurance, dynamic standing balance/coordinaiton, limb loss education    Communication                Safety/Cognition/ Behavioral Observations               Pain   C/O pain to right leg, lyrica helping but needing prn oxy intermittenly   Pain <3/10   Assess Qshift and prn    Skin   RLE surgical incision, remainder of skin intact   Promote  healing and prevention of infection  Assess Qshift and prn      Discharge Planning:  Discharging home with spouse and caregiver. Family education Friday 9a-12p   Team Discussion: Patient post right BKA; with pain; phantom pain addressed. Modafinil added for fatigue.   Patient on target to meet rehab goals: yes, currently needs min assist for sit - stand transfers and stand pivot transfers and CGA for squat pivot transfers. Needs set up for bathing and min assist for lower body dressing with mod assist for toileting.  *See Care Plan and progress notes for long and short-term goals.   Revisions to Treatment Plan:  Upgraded goals   Teaching Needs: Safety, skin care, medications, dietary modifications, transfers, toileting, etc.  Current Barriers to Discharge: Decreased caregiver support, Home enviroment access/layout, and Hemodialysis  Possible Resolutions to Barriers: Family and caregiver education HH follow up services DME: right amputee support pad     Medical Summary               I attest that I was present, lead the team conference, and concur with the assessment and plan of the team.   Chana Bode B 08/07/2022, 2:28 PM

## 2022-08-08 LAB — RENAL FUNCTION PANEL
Albumin: 3.1 g/dL — ABNORMAL LOW (ref 3.5–5.0)
Anion gap: 11 (ref 5–15)
BUN: 64 mg/dL — ABNORMAL HIGH (ref 8–23)
CO2: 25 mmol/L (ref 22–32)
Calcium: 8.2 mg/dL — ABNORMAL LOW (ref 8.9–10.3)
Chloride: 99 mmol/L (ref 98–111)
Creatinine, Ser: 7.71 mg/dL — ABNORMAL HIGH (ref 0.61–1.24)
GFR, Estimated: 7 mL/min — ABNORMAL LOW (ref >60)
Glucose, Bld: 148 mg/dL — ABNORMAL HIGH (ref 70–99)
Phosphorus: 3.8 mg/dL (ref 2.5–4.6)
Potassium: 5.6 mmol/L — ABNORMAL HIGH (ref 3.5–5.1)
Sodium: 135 mmol/L (ref 135–145)

## 2022-08-08 LAB — CBC
HCT: 28.3 % — ABNORMAL LOW (ref 39.0–52.0)
Hemoglobin: 8.8 g/dL — ABNORMAL LOW (ref 13.0–17.0)
MCH: 29.4 pg (ref 26.0–34.0)
MCHC: 31.1 g/dL (ref 30.0–36.0)
MCV: 94.6 fL (ref 80.0–100.0)
Platelets: 130 10*3/uL — ABNORMAL LOW (ref 150–400)
RBC: 2.99 MIL/uL — ABNORMAL LOW (ref 4.22–5.81)
RDW: 17.5 % — ABNORMAL HIGH (ref 11.5–15.5)
WBC: 6.7 10*3/uL (ref 4.0–10.5)
nRBC: 0 % (ref 0.0–0.2)

## 2022-08-08 MED ORDER — ROSUVASTATIN CALCIUM 10 MG PO TABS: 10.0000 mg | ORAL_TABLET | Freq: Every day | ORAL | 0 refills | Status: DC

## 2022-08-08 MED ORDER — TIZANIDINE HCL 2 MG PO TABS: 2.0000 mg | ORAL_TABLET | Freq: Two times a day (BID) | ORAL | 0 refills | Status: DC | PRN

## 2022-08-08 MED ORDER — LIDOCAINE-PRILOCAINE 2.5-2.5 % EX CREA: 1.0000 | TOPICAL_CREAM | CUTANEOUS | Status: DC | PRN

## 2022-08-08 MED ORDER — PANTOPRAZOLE SODIUM 40 MG PO TBEC: 40.0000 mg | DELAYED_RELEASE_TABLET | Freq: Every day | ORAL | 0 refills | Status: DC

## 2022-08-08 MED ORDER — ASCORBIC ACID 1000 MG PO TABS: 1000.0000 mg | ORAL_TABLET | Freq: Every day | ORAL | 0 refills | Status: DC

## 2022-08-08 MED ORDER — ZINC SULFATE 220 (50 ZN) MG PO CAPS: 220.0000 mg | ORAL_CAPSULE | Freq: Every day | ORAL | 0 refills | Status: DC

## 2022-08-08 MED ORDER — MODAFINIL 200 MG PO TABS: 200.0000 mg | ORAL_TABLET | Freq: Every day | ORAL | 3 refills | Status: DC

## 2022-08-08 MED ORDER — CARVEDILOL 12.5 MG PO TABS: 12.5000 mg | ORAL_TABLET | Freq: Two times a day (BID) | ORAL | 0 refills | Status: DC

## 2022-08-08 MED ORDER — OXYCODONE HCL 5 MG PO TABS: 5.0000 mg | ORAL_TABLET | ORAL | 0 refills | Status: DC | PRN

## 2022-08-08 MED ORDER — PENTAFLUOROPROP-TETRAFLUOROETH EX AERO: 1.0000 | INHALATION_SPRAY | CUTANEOUS | Status: DC | PRN

## 2022-08-08 MED ORDER — VITAMIN D (ERGOCALCIFEROL) 1.25 MG (50000 UNIT) PO CAPS: 50000.0000 [IU] | ORAL_CAPSULE | ORAL | 3 refills | Status: DC

## 2022-08-08 MED ORDER — ACETAMINOPHEN 325 MG PO TABS: 650.0000 mg | ORAL_TABLET | ORAL | Status: DC | PRN

## 2022-08-08 MED ORDER — LIDOCAINE HCL (PF) 1 % IJ SOLN: 5.0000 mL | INTRAMUSCULAR | Status: DC | PRN

## 2022-08-08 MED ORDER — PREGABALIN 25 MG PO CAPS: ORAL_CAPSULE | ORAL | 0 refills | Status: DC

## 2022-08-08 MED ORDER — ALLOPURINOL 100 MG PO TABS: 200.0000 mg | ORAL_TABLET | Freq: Every day | ORAL | 0 refills | Status: AC

## 2022-08-08 NOTE — Progress Notes (Signed)
PROGRESS NOTE   Subjective/Complaints: No new complaints this morning  Phantom limb pain still bothers him at times but he slept better last night Discussed hydrocortisone cream for his dermatitis    ROS:    Pt denies SOB, abd pain, CP, N/V/C/D, and vision changes, +phantom limb pain, +red areas on left residual limb, insomnia improved  Objective:   No results found. Recent Labs    08/06/22 1200  WBC 7.5  HGB 9.1*  HCT 27.4*  PLT 133*   Recent Labs    08/06/22 1200  NA 133*  K 5.8*  CL 94*  CO2 25  GLUCOSE 171*  BUN 62*  CREATININE 8.78*  CALCIUM 8.1*    Intake/Output Summary (Last 24 hours) at 08/08/2022 1121 Last data filed at 08/07/2022 2130 Gross per 24 hour  Intake --  Output 50 ml  Net -50 ml        Physical Exam: Vital Signs Blood pressure (!) 142/58, pulse 68, temperature 98.5 F (36.9 C), temperature source Oral, resp. rate 18, height (P) 4\' 5"  (1.346 m), weight 85.7 kg, SpO2 96 %.  Gen: no distress, normal appearing, BMI 47.29 HEENT: oral mucosa pink and moist, NCAT Cardio: Reg rate Chest: normal effort, normal rate of breathing Abd: soft, non-distended Ext: no edema Psych: pleasant, normal affect Skin: VAC off BKA- R BKA bulbous with shrinker in place- no drainage out of shrinker seen, red areas on left residual limb Neurologic: Cranial nerves II through XII intact, motor strength is 4/5 in bilateral deltoid, bicep, tricep, grip, hip flexor, L knee extensors, RIght knee ext not tested due to Wound vac, bilateral distal testing deferred due to bilateral BKA Sensory exam normal sensation to light touch except for left little finger sensation in bilateral upper  Musculoskeletal: Full range of motion bilateral upper extremities bilateral hand intrinsic minus deformities. Right BKA with wound VAC limiting examination, left knee range of motion appears to be within functional limits wearing left  BKA prosthetic Straps on prosthesis coming off     Assessment/Plan: 1. Functional deficits which require 3+ hours per day of interdisciplinary therapy in a comprehensive inpatient rehab setting. Physiatrist is providing close team supervision and 24 hour management of active medical problems listed below. Physiatrist and rehab team continue to assess barriers to discharge/monitor patient progress toward functional and medical goals  Care Tool:  Bathing    Body parts bathed by patient: Face, Right arm, Left arm, Chest, Abdomen, Right upper leg, Left upper leg, Front perineal area, Buttocks   Body parts bathed by helper: Buttocks, Front perineal area Body parts n/a: Right lower leg, Left lower leg   Bathing assist Assist Level: Supervision/Verbal cueing     Upper Body Dressing/Undressing Upper body dressing   What is the patient wearing?: Pull over shirt    Upper body assist Assist Level: Set up assist    Lower Body Dressing/Undressing Lower body dressing      What is the patient wearing?: Underwear/pull up, Pants, Orthosis (L prosthesis)     Lower body assist Assist for lower body dressing: Moderate Assistance - Patient 50 - 74%     Toileting Toileting    Toileting assist  Assist for toileting: Maximal Assistance - Patient 25 - 49%     Transfers Chair/bed transfer  Transfers assist     Chair/bed transfer assist level: Contact Guard/Touching assist (squat<>pivot)     Locomotion Ambulation   Ambulation assist   Ambulation activity did not occur: Safety/medical concerns (2/2 pt fatigue and LLE prosthetic stability)          Walk 10 feet activity   Assist  Walk 10 feet activity did not occur: Safety/medical concerns (2/2 pt fatigue and LLE prosthetic stability)        Walk 50 feet activity   Assist Walk 50 feet with 2 turns activity did not occur: Safety/medical concerns (2/2 pt fatigue and LLE prosthetic stability)         Walk 150 feet  activity   Assist Walk 150 feet activity did not occur: Safety/medical concerns (2/2 pt fatigue and LLE prosthetic stability)         Walk 10 feet on uneven surface  activity   Assist Walk 10 feet on uneven surfaces activity did not occur: Safety/medical concerns (2/2 pt fatigue and LLE prosthetic stability)         Wheelchair     Assist Is the patient using a wheelchair?: Yes Type of Wheelchair: Manual    Wheelchair assist level: Independent Max wheelchair distance: 335ft    Wheelchair 50 feet with 2 turns activity    Assist        Assist Level: Independent   Wheelchair 150 feet activity     Assist      Assist Level: Independent   Blood pressure (!) 142/58, pulse 68, temperature 98.5 F (36.9 C), temperature source Oral, resp. rate 18, height (P) 4\' 5"  (1.346 m), weight 85.7 kg, SpO2 96 %.  Medical Problem List and Plan: 1. Functional deficits secondary to right BKA secondary to abscess 07/26/2022 with wound VAC per Dr. Lajoyce Corners as well as history of left BKA 05/09/2021.  Patient does have a prosthesis           Con't CIR PT, and PT- limb guard and shrinker to RLE- wearing LLE prosthesis             -ELOS/Goals: 12-14d, Sup/minA level goals   Discussed plan for d/c Sunday, does not need to wait for attending to see him on Sunday, can leave as soon as he and his wife are ready.  2.  Antithrombotics: -DVT/anticoagulation:  Pharmaceutical: Heparin             -antiplatelet therapy: N/A 3. Residual limb pain: N/A Oxycodone d/ced  4/27- Pt insists that Oxy be restarted, since didn't make any difference in itching- itching likely due to HD patient.   4/28- pain was doing better since restarted Oxy. Worse this AM, since went too long between meds 4. Mood/Behavior/Sleep: Trazodone nightly as needed             -antipsychotic agents: N/A 5. Neuropsych/cognition: This patient is capable of making decisions on his own behalf. 6. Skin/Wound Care: Routine skin  checks and left BKA wound VAC may be removed 08/02/2022 7. Fluids/Electrolytes/Nutrition: Routine in and outs with follow-up chemistries 8.  End-stage renal disease.  Continue hemodialysis as directed 9.  Diabetes mellitus.  Latest hemoglobin A1c  5.0.  d/c SSI. Provided list of foods for good blood pressure control  4/27- BG's well controlled- con't regimen 10.  Acute on chronic anemia.  Continue Aranesp 11.  Hypothyroidism.  Synthroid 12.  Hyperlipidemia.  Crestor 13.  CAD/CABG 2008 as well as history of atrial flutter with ablation 04/06/2020.  Follow-up outpatient with Dr. Ladona Ridgel 14.  History of prostate cancer TURP.  Follow-up outpatient 15.  Hypertension.  Monitor with increased mobility.  Patient had been on Coreg 12.5 mg twice daily, Cozaar 50 mg twice daily prior to admission on hold due to blood pressure being soft.  BP reviewed and continues to be soft so will hold 16. Difficulty swallowing pills: SLP consulted, vitamin C supplement d/ced since it is too large to swallow 17. Fatigue/daytime somnolence: continue modafinil to 200mg  daily 18. Straps on prosthesis coming off: appreciate [facility] eval 19. Dry eyes: eye drops started 20. Loose stools: discussed resolution with one time dose of imodium 21. Phantom limb pain: Lyrica increased to 25mg  TID and 50mg  HS,  Zanaflex added. Discussed that this has improved.  22. Hyponatremia: continue to monitor with HD labs  23. Dermatitis on left residual limb: [facility] notified, hydrocortisone cream ordered  >50 minutes spent in discussion of discharge plan on Sunday, that we will go over his medications and follow-ups on Friday, discussed steroid cream for his dermatitis, discussed his phantom limb pain    LOS: 10 days A FACE TO FACE EVALUATION WAS PERFORMED  [name] 08/08/2022, 11:21 AM

## 2022-08-08 NOTE — Procedures (Addendum)
HD Note:  Some information was entered later than the data was gathered due to patient care needs. The stated time with the data is accurate.  Received patient in bed to unit.  Alert and oriented.  Informed consent signed and in chart.   TX duration: 3.5 hours  Arterial pressure was running high, BFR turned to 350 within 10 min of start of treatment.  Patient reported pain of 2-3 in his right leg and phantom pain in amputated foot. Once treatment started, the pain began.  Meds given, see MAR  .   Access used: Left upper arm fistula Access issues: The dressing was still on the last access sites.  The lowest access point started bleeding when the dressing was removed..  See above for pressure issues. After achieving effective needle manipulation for the arterial line, the BFR was put back to 400 successfully      [facility]

## 2022-08-08 NOTE — Progress Notes (Signed)
Occupational Therapy Session Note  Patient Details  Name: [name] MRN: [id number] Date of Birth: 18-Sep-1950  Today's Date: 08/08/2022 OT Individual Time: 0903-1000 OT Individual Time Calculation (min): 57 min    Short Term Goals: Week 2:  OT Short Term Goal 1 (Week 2): STG = LTG due to ELOS  Skilled Therapeutic Interventions/Progress Updates:  Skilled OT intervention completed with focus on w/c mobility, contracture prevention and residual limb exercises. Pt received seated in w/c, agreeable to session. 4/10 pain reported in R residual limb; pre-medicated. OT offered rest breaks and repositioning throughout for pain reduction.  Noted water coming from wall of bathroom under sink traveling to other side of room, with pt stating he didn't spill anything. Secretary notified of maintenance request for pt safety.   Pt declined self-care needs. Self-propelled in w/c <> gym with mod I. Able to position and set himself up next to EOM with cues needed again to doff R leg rest prior. Supervision squat pivot to EOM. Transitioned supine with mod I, with wedge/pillow provided for comfort.  Pt completed the following exercises to address contracture prevention and strength of the R residual limb: [Supine] x15 -knee extension with limb on bolster for greater ROM -hip flexion -hip abduction [L side-lying] -hip abduction x5, upgraded to using theraband 2x10 -hip extension 2x10 with theraband -single L leg glute bridge x10  HEP issued to pt for residual limb care, exercises, along with HEP for BUE strengthening using therabands.   Transitioned to EOM with mod I, supervision squat pivot to w/c. Dependent transport back to room for time. Pt remained seated in w/c, with chair alarm on/activated, and with all needs in reach at end of session.   Therapy Documentation Precautions:  Precautions Precautions: Fall Restrictions Weight Bearing Restrictions: Yes RLE Weight Bearing: Non weight  bearing Other Position/Activity Restrictions: RLE    Therapy/Group: Individual Therapy  Melvyn Novas, MS, OTR/L  08/08/2022, 12:21 PM

## 2022-08-08 NOTE — Progress Notes (Signed)
Physical Therapy Session Note  Patient Details  Name: [name] MRN: [id number] Date of Birth: 03/08/1951  Today's Date: 08/08/2022 PT Individual Time: [phone] and [id number] PT Individual Time Calculation (min): 55 min and 72 min  Short Term Goals: Week 1:  PT Short Term Goal 1 (Week 1): Pt will completed sit to stand from [facility] with ModA + LRAD PT Short Term Goal 1 - Progress (Week 1): Progressing toward goal PT Short Term Goal 2 (Week 1): Pt will maintain static standing balance with CGA + LRAD PT Short Term Goal 2 - Progress (Week 1): Met PT Short Term Goal 3 (Week 1): Pt will initiate SPT practice with LRAD PT Short Term Goal 3 - Progress (Week 1): Met Week 2:  PT Short Term Goal 1 (Week 2): STG=LTG due to LOS  Skilled Therapeutic Interventions/Progress Updates:   Treatment Session 1 Received pt sitting in WC, pt agreeable to PT treatment, and reported pain 2-3/10 in R residual limb - RN notified of pt's request for medication during session. Session with emphasis on functional mobility/transfers, generalized strengthening and endurance, dynamic standing balance/coordination, and simulated car transfers. Pt performed WC mobility >338ft using BUE and supervision/mod I with increased time to ortho gym - emphasis on UE strengthening. Pt performed simulated car transfer via squat<>pivot x 2 trials with close supervision with therapist stabilizing WC from sliding. Pt required min cues for hand placement and transfer technique. Demonstrated technique for navigating ramp ascending forwards and backwards - pt preferred forwards. Pt then propelled along 23ft ramp with BUE and CGA when ascending and supervision when descending. Worked on sit<>stands from 20in high WC x 3 reps with mod fading to min A - pt able to recall proper hand placement on RW and WC armrest. Pt transported back to room in WC dependently. Removed limb guard and shrinker to inspect residual limb - noted 1 area of drainage along L  lateral aspect of limb. Donned new shrinker and requested additional 3XL shrinkers from MD. Concluded session with pt sitting in recliner, needs within reach, and seatbelt alarm on.   Treatment Session 2 Received pt sitting in [facility] with RN and NT gathering pt's items to switch rooms due to water leak. Pt agreeable to PT treatment and reported intermittent phantom pain throughout session. Session with emphasis on functional mobility/transfers, generalized strengthening and endurance, and dynamic standing balance/coordination. Donned R limb guard and transported to/from room in [facility] dependently for time management purposes. Stood from [facility] with RW and mod A and attempted stand<>pivot with RW to R, however pt unable to hop backwards on prosthetic to reach mat; therefore pivoted hips into WC for safety. Pt transferred to/from mat via squat<>pivot with close supervision and assist to stabilize WC.   Pt transferred sit<>supine on wedge with supervision and performed the following exercises with emphasis on strength/ROM and contracture prevention: -supine hip abduction 2x10 bilaterally -supine pelvic tilts 2x10 (pt unable to perform single leg bridge) -L sidelying R hip abduction 2x8 -prone R hamstring curls 2x10 -supine hip adduction ball squeezes 2x10 with 10 second isometric hold -supine chest press with 9lb dowel 2x15 -supine shoulder extensions into physioball 2x10 with 10 second isometric hold -supine SLR 2x10 bilaterally  Pt transferred supine<>sitting EOM with supervision and returned to WC. Returned to room and concluded session with pt sitting in [facility] with all needs within reach.   Therapy Documentation Precautions:  Precautions Precautions: Fall Precaution Comments: R LE wound vac Restrictions Weight Bearing Restrictions: Yes RLE Weight  Bearing: Non weight bearing Other Position/Activity Restrictions: RLE  Therapy/Group: Individual Therapy Martin Majestic PT, DPT  08/08/2022, 7:13  AM

## 2022-08-08 NOTE — Progress Notes (Signed)
[facility] Progress Note   Subjective:   Seen prior to HD. No new events noted. C/o pain around fistula, ran without issues last HD, does have some bruising and edema distal to prior cannulation site. ? Pain from infiltration, AVF still with strong bruit, will cannulate away from bruise and use ice/elevation.   Objective Vitals:   08/07/22 0644 08/07/22 1555 08/07/22 2015 08/08/22 0635  BP: (!) 152/64 (!) 147/77 (!) 157/58 (!) 142/58  Pulse: 76 66 71 68  Resp: 18 18 16 18   Temp: 98.5 F (36.9 C) 97.8 F (36.6 C) 97.7 F (36.5 C) 98.5 F (36.9 C)  TempSrc: Oral Oral Oral Oral  SpO2: 98% 100% 100% 96%  Weight:    85.7 kg  Height:       Physical Exam General: Alert male in NAD Heart: RRR, no murmurs, rubs or gallops Lungs: CTA bilaterally, respirations unlabored on RA Abdomen: Soft, non-distended, +BS Extremities: B/l BKA, no edema noted Dialysis Access:  LUE AVF with healing bruise proximally, new bruise and mild localized edema distally.   Additional Objective Labs: Basic Metabolic Panel: Recent Labs  Lab 08/01/22 1438 08/03/22 1630 08/06/22 1200  NA 131* 127* 133*  K 3.7 4.0 5.8*  CL 94* 89* 94*  CO2 24 23 25   GLUCOSE 134* 93 171*  BUN 48* 47* 62*  CREATININE 8.04* 7.99* 8.78*  CALCIUM 8.3* 8.1* 8.1*  PHOS 4.4 5.7* 4.5   Liver Function Tests: Recent Labs  Lab 08/01/22 1438 08/03/22 1630 08/06/22 1200  ALBUMIN 3.0* 3.2* 3.1*   No results for input(s): "LIPASE", "AMYLASE" in the last 168 hours. CBC: Recent Labs  Lab 08/01/22 1438 08/03/22 1630 08/06/22 1200  WBC 6.9 9.8 7.5  HGB 9.0* 10.3* 9.1*  HCT 27.2* 30.2* 27.4*  MCV 89.8 87.5 90.4  PLT 158 169 133*   Blood Culture    Component Value Date/Time   SDES  07/22/2022 1835    BLOOD BLOOD RIGHT ARM Performed at [facility], [address] [place], [place] [address]    [facility]  07/22/2022 1835    Blood Culture adequate volume BOTTLES DRAWN AEROBIC AND  ANAEROBIC Performed at [facility], [address], [place], [place] [address]    CULT  07/22/2022 1835    NO GROWTH 5 DAYS Performed at [facility], [address], [place], [place] [address]    REPTSTATUS 07/27/2022 FINAL 07/22/2022 1835    CBG: Recent Labs  Lab 08/03/22 1636 08/04/22 0600 08/04/22 1120 08/04/22 1706 08/05/22 0557  GLUCAP 81 110* 100* 110* 103*    Medications:   carvedilol  12.5 mg Oral BID WC   Chlorhexidine Gluconate Cloth  6 each Topical Q0600   Chlorhexidine Gluconate Cloth  6 each Topical Q0600   darbepoetin (ARANESP) injection - DIALYSIS  100 mcg Subcutaneous Q Sat-1800   enoxaparin (LOVENOX) injection  30 mg Subcutaneous Q24H   hydrocortisone cream   Topical BID   levothyroxine  112 mcg Oral Q0600   modafinil  200 mg Oral Daily   pantoprazole  40 mg Oral QHS   pregabalin  25 mg Oral TID   pregabalin  50 mg Oral QHS   rosuvastatin  10 mg Oral Daily   zinc sulfate  220 mg Oral Daily    Outpatient Dialysis Orders: [facility] (Dr. Signe Colt follows)- Sun - Wed - Fri 3:48hr, BFR 425, EDW 88kg, L AVF, no heparin - Venofer 100mg  IV weekly.  Assessment/Plan: 1. RLE cellulitis:  right BKA  4/19 Dr. Lajoyce Corners, had wound VAC, antibiotics complete/CIR admit  2. Acute Hypoxic Resp Failure: Admit intubated / extubated 4/16 /stable now on room air.  UF as tolerated 3. ESRD: [facility] / in hosp TTS schedule while here. Next HD today. Noted K+ 5.8 prior to HD, discussed cutting back on high K+ foods. Having some pain around AVF, appears to have had a possible small infiltration but still able to use access. Ice/elevation.  4. HTN/volume: BP stable on HD, variable bed weights - 2L UFG. Will need a new EDW s/p amputation.  5. Anemia: s/p 2 units PRBCs 4/18. Aranesp started 4/18. Stable in the 9-10s 6. Secondary hyperparathyroidism:  CorrCa controlled, not on VDRA. Phos at goal, not taking binder here.  7. Nutrition: Albumin improving. On protein  supplements. on protein supp  8. T2DM: per admitting team    Rogers Blocker, PA-C 08/08/2022, 10:43 AM  [facility] Pager: [phone]

## 2022-08-08 NOTE — Progress Notes (Addendum)
Patient ID: [name], male   DOB: 23-Oct-1950, 72 y.o.   MRN: [id number]  [facility] referral sent to [facility] Patient approved

## 2022-08-09 NOTE — Progress Notes (Signed)
Received patient in bed to unit.  Alert and oriented.  Informed consent signed and in chart.   TX duration:3:30 Hrs  Patient tolerated well.  Transported back to the room  Alert, without acute distress.  Hand-off given to patient's nurse.   Access used: left AV fistula Access issues: none  Total UF removed: Medication(s) given: none Post HD VS: 166/71 HR 61, oxygen sat 98% Post HD weight: 83.7   [facility]

## 2022-08-09 NOTE — Progress Notes (Signed)
Inpatient Rehabilitation Care Coordinator Discharge Note DC SUNDAY 5/5  Patient Details  Name: [name] MRN: [id number] Date of Birth: 11-16-1950   Discharge location: HOME WITH WIFE AND HIRED CAREGIVER  Length of Stay: 13 DAYS  Discharge activity level: SUPERVISION-MIN ASSIST LEVEL  Home/community participation: ACTIVE  Patient response WU:JWJXBJ Literacy - How often do you need to have someone help you when you read instructions, pamphlets, or other written material from your doctor or pharmacy?: Sometimes  Patient response YN:WGNFAO Isolation - How often do you feel lonely or isolated from those around you?: Never  Services provided included: MD, RD, PT, OT, RN, CM, TR, Pharmacy, Neuropsych, SW  Financial Services:  Field seismologist Utilized: [employer] offered to/list presented to: PT AND WIFE  Follow-up services arranged:  Home Health, DME, Patient/Family request agency HH/DME Home Health Agency: [facility] PT  OT  RN    DME : [facility] HH/DME Requested Agency: HAS HAD BEFORE BOTH COMPANIES  Patient response to transportation need: Is the patient able to respond to transportation needs?: Yes In the past 12 months, has lack of transportation kept you from medical appointments or from getting medications?: No In the past 12 months, has lack of transportation kept you from meetings, work, or from getting things needed for daily living?: No   Patient/Family verbalized understanding of follow-up arrangements:  Yes  Individual responsible for coordination of the follow-up plan: [name]-WIFE [phone]  Confirmed correct DME delivered: Lucy Chris 08/09/2022    Comments (or additional information): WIFE HERE FOR EDUCATION ALONG WITH HIRED CAREGIVER AND FEEL COMFORTABLE WITH DC HOME.  Summary of Stay    Date/Time Discharge Planning CSW  08/06/22 1503 Discharging home with spouse and caregiver. Family education Friday  9a-12p CJB  07/30/22 1211 Discharging home with spouse, requesting HC for a companion, sw will provie HC resources. Patient has: RE, rollator, WC, BSC, Shower Seat and SPC. Family able to provide 24/7. CJB       Laine Fonner, Lemar Livings

## 2022-08-09 NOTE — Progress Notes (Signed)
PROGRESS NOTE   Subjective/Complaints: Patient's chart reviewed- No issues reported overnight Vitals signs stable except for elevated SBP and low DBP    ROS:  Pt denies SOB, abd pain, CP, N/V/C/D, and vision changes, +phantom limb pain, +red areas on left residual limb, insomnia improved  Objective:   No results found. Recent Labs    08/06/22 1200 08/08/22 1546  WBC 7.5 6.7  HGB 9.1* 8.8*  HCT 27.4* 28.3*  PLT 133* 130*   Recent Labs    08/06/22 1200 08/08/22 1546  NA 133* 135  K 5.8* 5.6*  CL 94* 99  CO2 25 25  GLUCOSE 171* 148*  BUN 62* 64*  CREATININE 8.78* 7.71*  CALCIUM 8.1* 8.2*    Intake/Output Summary (Last 24 hours) at 08/09/2022 1046 Last data filed at 08/09/2022 0755 Gross per 24 hour  Intake 148 ml  Output 2000 ml  Net -1852 ml        Physical Exam: Vital Signs Blood pressure (!) 138/57, pulse 61, temperature 98.2 F (36.8 C), temperature source Oral, resp. rate 17, height (P) 4\' 5"  (1.346 m), weight 82.8 kg, SpO2 98 %.  Gen: no distress, normal appearing, BMI 45.69 HEENT: oral mucosa pink and moist, NCAT Cardio: Reg rate Chest: normal effort, normal rate of breathing Abd: soft, non-distended Ext: no edema Psych: pleasant, normal affect Skin: VAC off BKA- R BKA bulbous with shrinker in place- no drainage out of shrinker seen, red areas on left residual limb Neurologic: Cranial nerves II through XII intact, motor strength is 4/5 in bilateral deltoid, bicep, tricep, grip, hip flexor, L knee extensors, RIght knee ext not tested due to Wound vac, bilateral distal testing deferred due to bilateral BKA Sensory exam normal sensation to light touch except for left little finger sensation in bilateral upper  Musculoskeletal: Full range of motion bilateral upper extremities bilateral hand intrinsic minus deformities. Right BKA with wound VAC limiting examination, left knee range of motion appears to  be within functional limits wearing left BKA prosthetic Straps on prosthesis coming off Supervision for transfers     Assessment/Plan: 1. Functional deficits which require 3+ hours per day of interdisciplinary therapy in a comprehensive inpatient rehab setting. Physiatrist is providing close team supervision and 24 hour management of active medical problems listed below. Physiatrist and rehab team continue to assess barriers to discharge/monitor patient progress toward functional and medical goals  Care Tool:  Bathing    Body parts bathed by patient: Face, Right arm, Left arm, Chest, Abdomen, Right upper leg, Left upper leg, Front perineal area, Buttocks   Body parts bathed by helper: Buttocks, Front perineal area Body parts n/a: Right lower leg, Left lower leg   Bathing assist Assist Level: Supervision/Verbal cueing     Upper Body Dressing/Undressing Upper body dressing   What is the patient wearing?: Pull over shirt    Upper body assist Assist Level: Set up assist    Lower Body Dressing/Undressing Lower body dressing      What is the patient wearing?: Underwear/pull up, Pants, Orthosis (L prosthesis)     Lower body assist Assist for lower body dressing: Moderate Assistance - Patient 50 - 74%  Toileting Toileting    Toileting assist Assist for toileting: Maximal Assistance - Patient 25 - 49%     Transfers Chair/bed transfer  Transfers assist     Chair/bed transfer assist level: Contact Guard/Touching assist (squat<>pivot)     Locomotion Ambulation   Ambulation assist   Ambulation activity did not occur: Safety/medical concerns (2/2 pt fatigue and LLE prosthetic stability)          Walk 10 feet activity   Assist  Walk 10 feet activity did not occur: Safety/medical concerns (2/2 pt fatigue and LLE prosthetic stability)        Walk 50 feet activity   Assist Walk 50 feet with 2 turns activity did not occur: Safety/medical concerns (2/2 pt  fatigue and LLE prosthetic stability)         Walk 150 feet activity   Assist Walk 150 feet activity did not occur: Safety/medical concerns (2/2 pt fatigue and LLE prosthetic stability)         Walk 10 feet on uneven surface  activity   Assist Walk 10 feet on uneven surfaces activity did not occur: Safety/medical concerns (2/2 pt fatigue and LLE prosthetic stability)         Wheelchair     Assist Is the patient using a wheelchair?: Yes Type of Wheelchair: Manual    Wheelchair assist level: Independent Max wheelchair distance: 377ft    Wheelchair 50 feet with 2 turns activity    Assist        Assist Level: Independent   Wheelchair 150 feet activity     Assist      Assist Level: Independent   Blood pressure (!) 138/57, pulse 61, temperature 98.2 F (36.8 C), temperature source Oral, resp. rate 17, height (P) 4\' 5"  (1.346 m), weight 82.8 kg, SpO2 98 %.  Medical Problem List and Plan: 1. Functional deficits secondary to right BKA secondary to abscess 07/26/2022 with wound VAC per Dr. Lajoyce Corners as well as history of left BKA 05/09/2021.  Patient does have a prosthesis           Con't CIR PT, and PT- limb guard and shrinker to RLE- wearing LLE prosthesis             -ELOS/Goals: 12-14d, Sup/minA level goals   Discussed plan for d/c Sunday, does not need to wait for attending to see him on Sunday, can leave as soon as he and his wife are ready.  2.  Antithrombotics: -DVT/anticoagulation:  Pharmaceutical: Heparin             -antiplatelet therapy: N/A 3. Residual limb pain: N/A Oxycodone d/ced  4/27- Pt insists that Oxy be restarted, since didn't make any difference in itching- itching likely due to HD patient.   4/28- pain was doing better since restarted Oxy. Worse this AM, since went too long between meds 4. Mood/Behavior/Sleep: Trazodone nightly as needed             -antipsychotic agents: N/A 5. Neuropsych/cognition: This patient is capable of making  decisions on his own behalf. 6. Skin/Wound Care: Routine skin checks and left BKA wound VAC may be removed 08/02/2022 7. Fluids/Electrolytes/Nutrition: Routine in and outs with follow-up chemistries 8.  End-stage renal disease.  Continue hemodialysis as directed 9.  Diabetes mellitus.  Latest hemoglobin A1c  5.0.  d/c SSI. Provided list of foods for good blood pressure control  4/27- BG's well controlled- con't regimen 10.  Acute on chronic anemia.  Continue Aranesp 11.  Hypothyroidism.  Synthroid 12.  Hyperlipidemia.  Crestor 13.  CAD/CABG 2008 as well as history of atrial flutter with ablation 04/06/2020.  Follow-up outpatient with Dr. Ladona Ridgel 14.  History of prostate cancer TURP.  Follow-up outpatient 15.  Hypertension.  Monitor with increased mobility.  Patient had been on Coreg 12.5 mg twice daily, Cozaar 50 mg twice daily prior to admission on hold due to blood pressure being soft.  BP reviewed and continues to be soft so will hold 16. Difficulty swallowing pills: SLP consulted, vitamin C supplement d/ced since it is too large to swallow 17. Fatigue/daytime somnolence: continue modafinil to 200mg  daily 18. Straps on prosthesis coming off: appreciate [employer] eval 19. Dry eyes: eye drops started 20. Loose stools: discussed resolution with one time dose of imodium 21. Phantom limb pain: Lyrica increased to 25mg  TID and 50mg  HS,  Zanaflex added. Discussed that this has improved. Continue these medications  22. Hyponatremia: resolved, continue to monitor with HD labs  23. Dermatitis on left residual limb: [facility] notified, hydrocortisone cream ordered, continue  24. Hyperkalemia: continue nephrology management, monitor with HD labs,      LOS: 11 days A FACE TO FACE EVALUATION WAS PERFORMED  [name] 08/09/2022, 10:46 AM

## 2022-08-09 NOTE — Progress Notes (Signed)
Patient ID: [name], male   DOB: Sep 21, 1950, 72 y.o.   MRN: [id number]  Met with pt and wife who is here for family education to answer their questions. Discussed home health follow up and DME ordered. Wife asked if caregivers are covered at home. Discussed this would be private pay. Wife wants a call from [facility] today to have schedule for next week's therapies. Will reach out to kelly-liaison to make aware of this.

## 2022-08-09 NOTE — Progress Notes (Signed)
Inpatient Rehabilitation Discharge Medication Review by a Pharmacist  A complete drug regimen review was completed for this patient to identify any potential clinically significant medication issues.  High Risk Drug Classes Is patient taking? Indication by Medication  Antipsychotic No   Anticoagulant No   Antibiotic No   Opioid Yes Oxycodone - moderate pain  Antiplatelet No   Hypoglycemics/insulin Yes Ozempic - DM  Vasoactive Medication Yes Coreg-HTN  Chemotherapy No   Other Yes Synthroid - hypothyroid Protonix - GERD Vitamin D, zinc, vitamin C - vitamin repletion Allopurinol - gout ppx Modafinil - daytime somnolence Lyrica, tizanidine - phantom limb pain Aranesp - anemia Crestor- HLD     Type of Medication Issue Identified Description of Issue Recommendation(s)  Drug Interaction(s) (clinically significant)     Duplicate Therapy     Allergy     No Medication Administration End Date     Incorrect Dose     Additional Drug Therapy Needed     Significant med changes from prior encounter (inform family/care partners about these prior to discharge).    Other  PTA meds not resumed during admission: Renvela, losartan, Mircera transitioned to Aranesp per formulary equivalent Restart PTA meds when and if necessary during CIR admission or at time of discharge, if warranted    Clinically significant medication issues were identified that warrant physician communication and completion of prescribed/recommended actions by midnight of the next day:  No   Time spent performing this drug regimen review (minutes):  15  Eron Goble BS, PharmD, BCPS Clinical Pharmacist 08/09/2022 8:16 AM  Contact: [phone] after 3 PM  "Be curious, not judgmental..." -Debbora Dus

## 2022-08-09 NOTE — Progress Notes (Signed)
Occupational Therapy Discharge Summary  Patient Details  Name: [name] MRN: [id number] Date of Birth: 1951/01/27  Date of Discharge from OT service:Aug 10, 2022   Patient has met 9 of 9 long term goals due to improved activity tolerance, improved balance, postural control, and improved coordination.  Patient to discharge at overall Min A to Supervision level.  Patient's wife is unavailable to provide anything over a supervision level, therefore has hired a caregiver specifically for showers/bathing, but is independent in supervising pt with functional transfers and other ADLs. Pt's wife and the hired caregiver "[name]" participated in family training on 5/3 to ensure a safe transition home.   All goals met  Recommendation:  Patient will benefit from ongoing skilled OT services in home health setting to continue to advance functional skills in the area of BADL, iADL, and Reduce care partner burden.  Equipment: No equipment provided  Reasons for discharge: treatment goals met and discharge from hospital  Patient/family agrees with progress made and goals achieved: Yes  OT Discharge Precautions/Restrictions  Precautions Precautions: Fall Restrictions Weight Bearing Restrictions: Yes RLE Weight Bearing: Non weight bearing ADL ADL Eating: Modified independent Where Assessed-Eating: Wheelchair Grooming: Modified independent Where Assessed-Grooming: Sitting at sink Upper Body Bathing: Modified independent Where Assessed-Upper Body Bathing: Shower Lower Body Bathing: Modified independent Where Assessed-Lower Body Bathing: Shower Upper Body Dressing: Independent Where Assessed-Upper Body Dressing: Wheelchair Lower Body Dressing: Minimal assistance Where Assessed-Lower Body Dressing: Wheelchair (w/c push up) Toileting: Contact guard Where Assessed-Toileting: Teacher, adult education: Close supervision Toilet Transfer Method: Ambulance person: Raised toilet  seat, Grab bars Tub/Shower Transfer: Not assessed (has roll in shower in home) Tub/Shower Transfer Method: Unable to assess Film/video editor: Close supervision Film/video editor Method: Administrator: Emergency planning/management officer, Grab bars Vision Baseline Vision/History: 1 Wears glasses Patient Visual Report: No change from baseline Vision Assessment?: No apparent visual deficits Perception  Perception: Within Functional Limits Praxis Praxis: Intact Cognition Cognition Overall Cognitive Status: Within Functional Limits for tasks assessed Arousal/Alertness: Awake/alert Orientation Level: Person;Place;Situation Person: Oriented Place: Oriented Situation: Oriented Memory: Appears intact Awareness: Appears intact Problem Solving: Impaired Safety/Judgment: Appears intact Comments: needs occasional cues for safety Brief Interview for Mental Status (BIMS) Repetition of Three Words (First Attempt): 3 Temporal Orientation: Year: Correct Temporal Orientation: Month: Accurate within 5 days Temporal Orientation: Day: Correct Recall: "Sock": Yes, no cue required Recall: "Blue": Yes, no cue required Recall: "Bed": Yes, no cue required BIMS Summary Score: 15 Sensation Sensation Light Touch: Appears Intact Hot/Cold: Not tested Proprioception: Not tested Stereognosis: Not tested Coordination Gross Motor Movements are Fluid and Coordinated: Yes Fine Motor Movements are Fluid and Coordinated: No Finger Nose Finger Test: Baseline bilateral hand tremors Motor  Motor Motor: Abnormal postural alignment and control Motor - Skilled Clinical Observations: sits with kyphotic posture. Altered balance strategies due to bilateral BKA with L prosthetic Mobility  Bed Mobility Bed Mobility: Rolling Right;Rolling Left;Sit to Supine;Supine to Sit Rolling Right: Independent with assistive device Rolling Left: Independent with assistive device Supine to Sit: Independent  with assistive device Sit to Supine: Independent with assistive device Transfers Sit to Stand: Contact Guard/Touching assist (elevated surfaces) Stand to Sit: Supervision/Verbal cueing  Trunk/Postural Assessment  Cervical Assessment Cervical Assessment: Exceptions to [facility] (forward head) Thoracic Assessment Thoracic Assessment: Exceptions to [facility] (kyphosis) Lumbar Assessment Lumbar Assessment: Exceptions to [facility] (posterior pelvic tilt/sacral sitting) Postural Control Postural Control: Deficits on evaluation Righting Reactions: delayed on R Protective Responses: delayed on R  Balance Balance Balance Assessed: Yes Static Sitting Balance Static Sitting - Balance Support: Feet supported;Bilateral upper extremity supported Static Sitting - Level of Assistance: 7: Independent Dynamic Sitting Balance Dynamic Sitting - Balance Support: No upper extremity supported;Feet supported Dynamic Sitting - Level of Assistance: 6: Modified independent (Device/Increase time) Static Standing Balance Static Standing - Balance Support: Bilateral upper extremity supported;During functional activity (RW) Static Standing - Level of Assistance: 5: Stand by assistance (CGA) Dynamic Standing Balance Dynamic Standing - Balance Support: Bilateral upper extremity supported;During functional activity (RW) Dynamic Standing - Level of Assistance: 4: Min assist Dynamic Standing - Comments: with stand<>pivot transfers Extremity/Trunk Assessment RUE Assessment RUE Assessment: Within Functional Limits LUE Assessment LUE Assessment: Within Functional Limits   Danya Spearman E Tracina Beaumont 08/09/2022, 7:53 AM

## 2022-08-09 NOTE — Progress Notes (Signed)
Occupational Therapy Session Note  Patient Details  Name: [name] MRN: [id number] Date of Birth: [birth year]  Today's Date: 08/09/2022 OT Individual Time: 0905-1000 & 4098-1191 OT Individual Time Calculation (min): 55 min & 71 min   Short Term Goals: Week 2:  OT Short Term Goal 1 (Week 2): STG = LTG due to ELOS  Skilled Therapeutic Interventions/Progress Updates:  Session 1 Skilled OT intervention completed with focus on family education with wife and hired caregiver present regarding ADL and functional transfer recommendations. Pt received seated in w/c, agreeable to session. No pain reported.  Demonstrated/educated on the following to pt, his wife and the hired caregiver "[name]"- -Limb care routine for self-inspection, signs of infection, shrinker donning/doffing and wash care, and wrapping method prior to shower. Advised to get clearance from surgeon/MD after staples removed in order shower without it covered -Donning/doffing limb guard and wearing schedule -Squat pivot <> shower using grab bars to TTB as pt has roll in shower but demonstrated in shower in room to TTB for practice. Demonstrated CGA/supervision level transfer with caregiver who will be assisting with this, practicing as well -W/c push up for LB dressing -Min A LB dressing but set up A/mod I for UB dressing and reviewed safety precautions with showers at home -Toilet transfer <> w/c on standard commode as this is pt's set up. Discussed importance of installing grab bar for transfer -All transfers with L prosthesis on for pt safety/efficiency  Wife requested visit from CSW, therefore CSW notified of request. Pt remained seated in w/c, with wife present, with all needs in reach at end of session.  Session 2 Skilled OT intervention completed with focus on w/c mobility, residual limb contracture prevention/strengthening. Pt received seated in w/c, agreeable to session. No pain reported.  Pt declined self-care needs or  showering. Requested to work on lying down exercises for R limb. Self-propelled in w/c <> gym for BUE endurance, with mod I.  Transferred via squat pivot with supervision <> EOM during session, with cues needed for removing his leg rest prior to transition but no assist to donn/doff them.  Mod I EOM to supine. Pt completed the following exercises to address contracture prevention and strength of the R residual limb: [Supine] 2x15 -quad squeezes with limb on bolster for greater ROM; cues needed to avoid WB on base of limb -hip flexion -hip abduction -knee extension with limb elevated  [L side-lying] 2x15 -hip abduction with yellow band 2x15 -hip extension 2x15  -circumduction clockwise, then counterclockwise x15  Pt declined lying prone to attempt deeper hip flexor stretch. Transitioned to EOM with mod I.   Back in room, pt remained seated in w/c, with chair alarm on/activated, and with all needs in reach at end of session.   Therapy Documentation Precautions:  Precautions Precautions: Fall Restrictions Weight Bearing Restrictions: Yes RLE Weight Bearing: Non weight bearing Other Position/Activity Restrictions: RLE   Therapy/Group: Individual Therapy  Melvyn Novas, MS, OTR/L  08/09/2022, 3:30 PM

## 2022-08-09 NOTE — Progress Notes (Signed)
Physical Therapy Session Note  Patient Details  Name: [name] MRN: [id number] Date of Birth: [birth year]  Today's Date: 08/09/2022 PT Individual Time: 1191-4782 PT Individual Time Calculation (min): 71 min   Short Term Goals: Week 1:  PT Short Term Goal 1 (Week 1): Pt will completed sit to stand from [facility] with ModA + LRAD PT Short Term Goal 1 - Progress (Week 1): Progressing toward goal PT Short Term Goal 2 (Week 1): Pt will maintain static standing balance with CGA + LRAD PT Short Term Goal 2 - Progress (Week 1): Met PT Short Term Goal 3 (Week 1): Pt will initiate SPT practice with LRAD PT Short Term Goal 3 - Progress (Week 1): Met Week 2:  PT Short Term Goal 1 (Week 2): STG=LTG due to LOS  Skilled Therapeutic Interventions/Progress Updates:   Received pt sitting in [facility] with wife and caregiver, [name], present for family education training. Pt agreeable to PT treatment and did not state pain level. Session with emphasis on discharge planning, functional mobility/transfers, generalized strengthening and endurance, dynamic standing balance/coordination, and simulated car transfers. Pt transported to/from room in [facility] dependently for time management purposes.   Pt performed simulated car transfer via squat<>pivot with close supervision and therapist stabilizing WC, with cues for transfer set up, L foot placement on floor, and hand placement. Pt's wife concerned that pt's lightweight WC does not have flip back armrests and she is currently unable to pick up regular manual WC. Suggested using AP/PA transfer method (as pt is currently supervision with that transfer technique), however pt refused to transfer without his leg on. Discussed stand<>pivot technique, however pt is much more unsteady with this method and recommended avoiding this technique. Suggested timing outings so that the manual WC with flip back armrests an be used - recommended one of the 2 caregivers or pt's children can load/unload WC  and/or using WC available at [employer]. Pt expressed that he wants to have a custom lightweight chair with flip back armrests that his wife can handle - provided pt with [name] from [employer]'s contact information.    In rehab apartment, practiced transferring on/off 30in high bed (to simulate pt's bed height at home) via squat<>pivot x 2 trials with mod A overall (caregiver on Standby) due to pt slipping on bedsheet and sliding forward on bed. Pt appeared to be externally distracted and more frustrated today, requiring more assist than normal. Pt then practiced squat<>pivot to/from recliner to simulate dialysis chair at home with supervision provided by therapist to get into chair and CGA/min A provided by [name] to return to [facility]. Pt then worked on sit<>stands x 3 trials with min A - Trial 1 with therapist, trial 2 with wife, and trial 3 with [name].   Educated pt's wife/caregiver on the following during session: - importance of remaining on pt's R side if able - WC parts management - stabilizing WC with transfers - body mechanics, hand placement, and use of gait belt with transfers   Returned to room and concluded session with pt sitting in [facility] with all needs within reach.   Therapy Documentation Precautions:  Precautions Precautions: Fall Precaution Comments: R LE wound vac Restrictions Weight Bearing Restrictions: Yes RLE Weight Bearing: Non weight bearing Other Position/Activity Restrictions: RLE  Therapy/Group: Individual Therapy Martin Majestic PT, DPT  08/09/2022, 6:56 AM

## 2022-08-09 NOTE — Progress Notes (Signed)
   08/08/22 1932  Vitals  Temp 98 F (36.7 C)  Temp Source Oral  BP (!) 153/65  MAP (mmHg) 65  BP Location Right Arm  BP Method Automatic  Patient Position (if appropriate) Lying  Pulse Rate 72  Pulse Rate Source Monitor  Resp 20  Oxygen Therapy  SpO2 98 %  O2 Device Room Air  Patient Activity (if Appropriate) In bed  Pulse Oximetry Type Continuous  During Treatment Monitoring  Blood Flow Rate (mL/min) 400 mL/min  Arterial Pressure (mmHg) -150 mmHg  Venous Pressure (mmHg) 200 mmHg  TMP (mmHg) 12 mmHg  Ultrafiltration Rate (mL/min) 830 mL/min  Dialysate Flow Rate (mL/min) 300 ml/min  HD Safety Checks Performed Yes  Intra-Hemodialysis Comments Tx completed  Post Treatment  Dialyzer Clearance Lightly streaked  Duration of HD Treatment -hour(s) 3.3 hour(s)  Liters Processed 76.3  Fluid Removed (mL) 2000 mL  Tolerated HD Treatment  (Pt tolerated treatment well)  Post-Hemodialysis Comments  (Pt stable)  AVG/AVF Arterial Site Held (minutes) 10 minutes  AVG/AVF Venous Site Held (minutes) 10 minutes  Fistula / Graft Left Upper arm Arteriovenous fistula  No placement date or time found.   Orientation: Left  Access Location: Upper arm  Access Type: Arteriovenous fistula  Site Condition No complications  Needle Size deaccessed  Drainage Description None

## 2022-08-09 NOTE — Progress Notes (Signed)
[facility] Progress Note   Subjective: Seen in room. Says he is going home Sunday. Upset because he wasn't brought to HD until late last night. HD tomorrow on schedule.   Objective Vitals:   08/08/22 2158 08/09/22 0526 08/09/22 0530 08/09/22 0833  BP:  (!) 133/49  (!) 138/57  Pulse: 67 (!) 59  61  Resp:  17    Temp:  98.2 F (36.8 C)    TempSrc:  Oral    SpO2:  98%    Weight:   82.8 kg   Height:       Physical Exam General: Chronically ill appearing male in NAD Heart: S1,S2 2/6 systolic M Lungs: CTAB Abdomen: NABS, NT Extremities:  R BKA in stump protector. L BKA with prosthesis Dialysis Access: L AVF + T/B   Additional Objective Labs: Basic Metabolic Panel: Recent Labs  Lab 08/03/22 1630 08/06/22 1200 08/08/22 1546  NA 127* 133* 135  K 4.0 5.8* 5.6*  CL 89* 94* 99  CO2 23 25 25   GLUCOSE 93 171* 148*  BUN 47* 62* 64*  CREATININE 7.99* 8.78* 7.71*  CALCIUM 8.1* 8.1* 8.2*  PHOS 5.7* 4.5 3.8   Liver Function Tests: Recent Labs  Lab 08/03/22 1630 08/06/22 1200 08/08/22 1546  ALBUMIN 3.2* 3.1* 3.1*   No results for input(s): "LIPASE", "AMYLASE" in the last 168 hours. CBC: Recent Labs  Lab 08/03/22 1630 08/06/22 1200 08/08/22 1546  WBC 9.8 7.5 6.7  HGB 10.3* 9.1* 8.8*  HCT 30.2* 27.4* 28.3*  MCV 87.5 90.4 94.6  PLT 169 133* 130*   Blood Culture    Component Value Date/Time   SDES  07/22/2022 1835    BLOOD BLOOD RIGHT ARM Performed at [facility], [address] [place], [place] [address]    [facility]  07/22/2022 1835    Blood Culture adequate volume BOTTLES DRAWN AEROBIC AND ANAEROBIC Performed at [facility], [address], [place], [place] [address]    CULT  07/22/2022 1835    NO GROWTH 5 DAYS Performed at [facility], [address], [place], [place] [address]    REPTSTATUS 07/27/2022 FINAL 07/22/2022 1835    Cardiac Enzymes: No results for input(s): "CKTOTAL",  "CKMB", "CKMBINDEX", "TROPONINI" in the last 168 hours. CBG: Recent Labs  Lab 08/03/22 1636 08/04/22 0600 08/04/22 1120 08/04/22 1706 08/05/22 0557  GLUCAP 81 110* 100* 110* 103*   Iron Studies: No results for input(s): "IRON", "TIBC", "TRANSFERRIN", "FERRITIN" in the last 72 hours. @lablastinr3 @ Studies/Results: No results found. Medications:   carvedilol  12.5 mg Oral BID WC   Chlorhexidine Gluconate Cloth  6 each Topical Q0600   Chlorhexidine Gluconate Cloth  6 each Topical Q0600   darbepoetin (ARANESP) injection - DIALYSIS  100 mcg Subcutaneous Q Sat-1800   enoxaparin (LOVENOX) injection  30 mg Subcutaneous Q24H   hydrocortisone cream   Topical BID   levothyroxine  112 mcg Oral Q0600   modafinil  200 mg Oral Daily   pantoprazole  40 mg Oral QHS   pregabalin  25 mg Oral TID   pregabalin  50 mg Oral QHS   rosuvastatin  10 mg Oral Daily     Outpatient Dialysis Orders: HHD (Dr. Signe Colt follows)- Sun - Wed - Fri 3:48hr, BFR 425, EDW 88kg, L AVF, no heparin - Venofer 100mg  IV weekly.   Assessment/Plan: 1. RLE cellulitis:  right BKA 4/19 Dr. Lajoyce Corners, had wound VAC, antibiotics complete/CIR admit  2. Acute Hypoxic Resp Failure: Admit intubated /  extubated 4/16 /stable now on room air.  UF as tolerated 3. ESRD: HHD / in hosp TTS schedule while here. Next HD today. Noted K+ 5.8 prior to HD, discussed cutting back on high K+ foods. Having some pain around AVF, appears to have had a possible small infiltration but still able to use access. Ice/elevation.  4. HTN/volume: BP stable on HD, variable bed weights. Will need a new EDW s/p amputation.  5. Anemia: s/p 2 units PRBCs 4/18. Aranesp started 4/18. Stable in the 9-10s 6. Secondary hyperparathyroidism:  CorrCa controlled, not on VDRA. Phos at goal, not taking binder here.  7. Nutrition: Albumin improving. On protein supplements. on protein supp  8. T2DM: per admitting team  [name] Gentry. Corrine Tillis NP-C 08/09/2022, 3:12 PM  [employer] [phone]

## 2022-08-10 DIAGNOSIS — N186 End stage renal disease: Secondary | ICD-10-CM

## 2022-08-10 DIAGNOSIS — Z992 Dependence on renal dialysis: Secondary | ICD-10-CM

## 2022-08-10 DIAGNOSIS — M79604 Pain in right leg: Secondary | ICD-10-CM

## 2022-08-10 DIAGNOSIS — E875 Hyperkalemia: Secondary | ICD-10-CM

## 2022-08-10 DIAGNOSIS — E1122 Type 2 diabetes mellitus with diabetic chronic kidney disease: Secondary | ICD-10-CM

## 2022-08-10 DIAGNOSIS — E871 Hypo-osmolality and hyponatremia: Secondary | ICD-10-CM

## 2022-08-10 LAB — CBC
HCT: 29.5 % — ABNORMAL LOW (ref 39.0–52.0)
Hemoglobin: 9.1 g/dL — ABNORMAL LOW (ref 13.0–17.0)
MCH: 29.3 pg (ref 26.0–34.0)
MCHC: 30.8 g/dL (ref 30.0–36.0)
MCV: 94.9 fL (ref 80.0–100.0)
Platelets: 127 10*3/uL — ABNORMAL LOW (ref 150–400)
RBC: 3.11 MIL/uL — ABNORMAL LOW (ref 4.22–5.81)
RDW: 17.3 % — ABNORMAL HIGH (ref 11.5–15.5)
WBC: 6.7 10*3/uL (ref 4.0–10.5)
nRBC: 0 % (ref 0.0–0.2)

## 2022-08-10 LAB — RENAL FUNCTION PANEL
Albumin: 3.2 g/dL — ABNORMAL LOW (ref 3.5–5.0)
Anion gap: 13 (ref 5–15)
BUN: 48 mg/dL — ABNORMAL HIGH (ref 8–23)
CO2: 27 mmol/L (ref 22–32)
Calcium: 8.7 mg/dL — ABNORMAL LOW (ref 8.9–10.3)
Chloride: 96 mmol/L — ABNORMAL LOW (ref 98–111)
Creatinine, Ser: 7 mg/dL — ABNORMAL HIGH (ref 0.61–1.24)
GFR, Estimated: 8 mL/min — ABNORMAL LOW (ref >60)
Glucose, Bld: 150 mg/dL — ABNORMAL HIGH (ref 70–99)
Phosphorus: 3.1 mg/dL (ref 2.5–4.6)
Potassium: 6.8 mmol/L (ref 3.5–5.1)
Sodium: 136 mmol/L (ref 135–145)

## 2022-08-10 NOTE — Progress Notes (Signed)
   08/10/22 1654  Vitals  Temp (!) 97.5 F (36.4 C)  Pulse Rate 66  Resp 20  BP (!) 165/81  SpO2 100 %  O2 Device Room Air  Weight 86.5 kg  Type of Weight Post-Dialysis  Oxygen Therapy  Patient Activity (if Appropriate) In bed  Pulse Oximetry Type Continuous  Oximetry Probe Site Changed No  Post Treatment  Dialyzer Clearance Lightly streaked  Duration of HD Treatment -hour(s) 3.5 hour(s)  Hemodialysis Intake (mL) 0 mL  Liters Processed 86  Fluid Removed (mL) 2500 mL  Tolerated HD Treatment Yes  Post-Hemodialysis Comments see notes  AVG/AVF Arterial Site Held (minutes) 10 minutes  AVG/AVF Venous Site Held (minutes) 10 minutes   Received patient in bed to unit.  Alert and oriented.  Informed consent signed and in chart.   TX duration:3.5  Patient tolerated well.  Transported back to the room  Alert, without acute distress.  Hand-off given to patient's nurse.   Access used: LUAF Access issues: no complications  Total UF removed: 2500 Medication(s) given: none   [facility]

## 2022-08-10 NOTE — Progress Notes (Addendum)
[facility] Progress Note   Subjective: He is being discharged tomorrow, can resume regular home HD schedule. HD later today on schedule.   Addendum: K+ 6.8. placed on 1.0 K bath. Educated about renal diet. He has been refusing renal diet, has been on a regular diet. Educated about dangers of hyperkalemia.   Objective Vitals:   08/09/22 1535 08/09/22 1914 08/10/22 0526 08/10/22 0900  BP: (!) 149/54 (!) 150/63 (!) 155/72 (!) 148/56  Pulse: 66 67 71 60  Resp: 18 15 18    Temp: 98.9 F (37.2 C) 98.4 F (36.9 C) 98.1 F (36.7 C)   TempSrc: Oral     SpO2: 100% 100% 100%   Weight:   77.1 kg   Height:       Physical Exam General: Chronically ill appearing male in NAD Heart: S1,S2 2/6 systolic M Lungs: CTAB Abdomen: NABS, NT Extremities:  R BKA in stump protector. L BKA with prosthesis Dialysis Access: L AVF + T/B  Additional Objective Labs: Basic Metabolic Panel: Recent Labs  Lab 08/03/22 1630 08/06/22 1200 08/08/22 1546  NA 127* 133* 135  K 4.0 5.8* 5.6*  CL 89* 94* 99  CO2 23 25 25   GLUCOSE 93 171* 148*  BUN 47* 62* 64*  CREATININE 7.99* 8.78* 7.71*  CALCIUM 8.1* 8.1* 8.2*  PHOS 5.7* 4.5 3.8   Liver Function Tests: Recent Labs  Lab 08/03/22 1630 08/06/22 1200 08/08/22 1546  ALBUMIN 3.2* 3.1* 3.1*   No results for input(s): "LIPASE", "AMYLASE" in the last 168 hours. CBC: Recent Labs  Lab 08/03/22 1630 08/06/22 1200 08/08/22 1546  WBC 9.8 7.5 6.7  HGB 10.3* 9.1* 8.8*  HCT 30.2* 27.4* 28.3*  MCV 87.5 90.4 94.6  PLT 169 133* 130*   Blood Culture    Component Value Date/Time   SDES  07/22/2022 1835    BLOOD BLOOD RIGHT ARM Performed at [facility], [address], [place], [place] [address]    [facility]  07/22/2022 1835    Blood Culture adequate volume BOTTLES DRAWN AEROBIC AND ANAEROBIC Performed at [facility], [address], [place], [place] [address]    CULT  07/22/2022 1835    NO  GROWTH 5 DAYS Performed at [facility], [address] [place], [place] [address]    REPTSTATUS 07/27/2022 FINAL 07/22/2022 1835    Cardiac Enzymes: No results for input(s): "CKTOTAL", "CKMB", "CKMBINDEX", "TROPONINI" in the last 168 hours. CBG: Recent Labs  Lab 08/03/22 1636 08/04/22 0600 08/04/22 1120 08/04/22 1706 08/05/22 0557  GLUCAP 81 110* 100* 110* 103*   Iron Studies: No results for input(s): "IRON", "TIBC", "TRANSFERRIN", "FERRITIN" in the last 72 hours. @lablastinr3 @ Studies/Results: No results found. Medications:   carvedilol  12.5 mg Oral BID WC   Chlorhexidine Gluconate Cloth  6 each Topical Q0600   Chlorhexidine Gluconate Cloth  6 each Topical Q0600   darbepoetin (ARANESP) injection - DIALYSIS  100 mcg Subcutaneous Q Sat-1800   enoxaparin (LOVENOX) injection  30 mg Subcutaneous Q24H   hydrocortisone cream   Topical BID   levothyroxine  112 mcg Oral Q0600   modafinil  200 mg Oral Daily   pantoprazole  40 mg Oral QHS   pregabalin  25 mg Oral TID   pregabalin  50 mg Oral QHS   rosuvastatin  10 mg Oral Daily     Outpatient Dialysis Orders: [facility] (Dr. Signe Colt follows)- Sun - Wed - Fri 3:48hr, BFR 425, EDW 88kg, L AVF, no heparin - Venofer  100mg  IV weekly.   Assessment/Plan: 1. RLE cellulitis:  right BKA 4/19 Dr. Lajoyce Corners, had wound VAC, antibiotics complete/CIR admit  2. Acute Hypoxic Resp Failure: Admit intubated / extubated 4/16 /stable now on room air.  UF as tolerated 3. ESRD: HHD / in hosp TTS schedule while here. Next HD today.   4. HTN/volume: BP stable on HD, variable bed weights. Will need a new EDW s/p amputation.  5. Anemia: s/p 2 units PRBCs 4/18. Aranesp started 4/18.HGB 8.8 today. ESA due 08/10/2022.  6. Secondary hyperparathyroidism:  CorrCa controlled, not on VDRA. PO4 at goal.  7. Nutrition: Albumin low, stable. On protein supplements. on protein supplements.  8. T2DM: per admitting team 9. Hyperkalemia: Pt has been on regular  diet. Changed to renal diet with fluid restrictions. Repeat labs today K+ 6.8. Educated about renal diet. On 1.0 K bath for 2 hours then switch back to 2.0 K bath for rest of tx. Repeat labs in AM prior to DC  Disposition: Home tomorrow. Check labs prior to DC D/T hyperkalemia.     [name] H. Nijel Flink NP-C 08/10/2022, 12:55 PM  [employer] [phone]

## 2022-08-10 NOTE — Progress Notes (Signed)
Physical Therapy Discharge Summary  Patient Details  Name: [name] MRN: [id number] Date of Birth: 05-09-1950  Date of Discharge from PT service:Aug 10, 2022  Today's Date: 08/10/2022 PT Individual Time: 4098-1191 PT Individual Time Calculation (min): 69 min   Patient has met 5 of 6 long term goals due to improved activity tolerance, improved balance, improved postural control, increased strength, increased range of motion, ability to compensate for deficits, improved awareness, and improved coordination.  Patient to discharge at a wheelchair level Supervision. Patient's care partner is independent to provide the necessary physical assistance at discharge. Pt's wife and caregiver attended family education training on 5/3 and verbalized and demonstrated confidence with tasks to ensure safe discharge home. Pt will have 2 caregivers to assist throughout the day. Have emphasized that squat<>pivot and lateral scoot transfers will be the safest transfer method at this time - pt/wife/caregiver in agreement.   Reasons goals not met: Pt did not meet dynamic standing goal of supervision as pt currently requires min A for dynamic standing balance due to decreased balance, inability to move/place L prosthetic in correct positions, and generalized weakness/deconditioning.   Recommendation:  Patient will benefit from ongoing skilled PT services in home health setting to continue to advance safe functional mobility, address ongoing impairments in transfers, generalized strengthening and endurance, dynamic standing balance/coordination, and to minimize fall risk.  Equipment: R amputee support pad - pt has all other equipment  and was provided contact information for [employer] to purchase custom lightweight wheelchair   Reasons for discharge: treatment goals met  Patient/family agrees with progress made and goals achieved: Yes  Today's Interventions: Received pt sitting in WC, pt agreeable to PT treatment,  and reported pain 3/10 in R residual limb (premedicated). Session with emphasis on discharge planning, functional mobility/transfers, generalized strengthening and endurance, and dynamic standing balance/coordination. Went through sensation, MMT, and pain interference questionnaire and donned limb guard with max A for time management purposes. Pt performed WC mobility >175ft x 2 trials using BUE and mod I to/from main therapy gym. Pt able to set up transfer to mat with supervision (1 cue to remove R legrest) and transferred WC<>mat to R via squat<>pivot/lateral scoot with supervision and therapist stabilizing WC. Pt stood from 20in high mat with RW and min A and picked up lego from floor using reacher and min A - encouraged pt to purchase reacher at home to maximize independence. Pt performed x 4 additional sit<>stands with RW and heavy min A overall from 20in high mat. Raised mat to 21in to simulate height of WC and pt performed x 5 additional sit<>stands with RW and CGA - pt demonstrating improvements in eccentric control when sitting.   Pt then performed modified crunches on large wedge 2x10 with 1.1 lb medicine ball with emphasis on abdominal strength. Transitioned to isometric shoulder extensions into physioball placed in front of pt 2x10 with 5 second isometric hold. Worked on unsupported sitting balance tossing unweighted ball overhead against rebounder for 1 minute x 3 trials with emphasis on reaction time, UE and core strength, and spatial awareness. Pt transferred mat<>WC to R via squat<>pivot with supervision and returned to room. Concluded session with pt sitting in [facility] with all needs within reach.   PT Discharge Precautions/Restrictions Precautions Precautions: Fall Precaution Comments: L prosthetic Restrictions Weight Bearing Restrictions: Yes RLE Weight Bearing: Non weight bearing Pain Interference Pain Interference Pain Effect on Sleep: 1. Rarely or not at all Pain Interference with  Therapy Activities: 1. Rarely or  not at all Pain Interference with Day-to-Day Activities: 1. Rarely or not at all Cognition Overall Cognitive Status: Within Functional Limits for tasks assessed Arousal/Alertness: Awake/alert Orientation Level: Oriented X4 Memory: Appears intact Awareness: Appears intact Problem Solving: Impaired Safety/Judgment: Appears intact Comments: needs occasional cues for safety Sensation Sensation Light Touch: Appears Intact Hot/Cold: Not tested Proprioception: Impaired by gross assessment Stereognosis: Not tested Additional Comments: phantom pain in R residual limb Coordination Gross Motor Movements are Fluid and Coordinated: No Fine Motor Movements are Fluid and Coordinated: No Coordination and Movement Description: Altered balance strategies due to bilateral BKA with L prosthetic and generalized weakness/deconditioning Finger Nose Finger Test: Baseline bilateral hand tremors Heel Shin Test: unable to perform due to bilateral BKA Motor  Motor Motor: Abnormal postural alignment and control Motor - Skilled Clinical Observations: sits with kyphotic posture. Altered balance strategies due to bilateral BKA with L prosthetic  Mobility Bed Mobility Bed Mobility: Rolling Right;Rolling Left;Sit to Supine;Supine to Sit Rolling Right: Independent with assistive device Rolling Left: Independent with assistive device Supine to Sit: Independent with assistive device Sit to Supine: Independent with assistive device Transfers Transfers: Sit to Stand;Stand to Sit;Stand Pivot Transfers;Squat Pivot Transfers;Anterior-Posterior Transfer;Lateral/Scoot Transfers Sit to Stand: Contact Guard/Touching assist (elevated surfaces) Stand to Sit: Supervision/Verbal cueing Stand Pivot Transfers: Minimal Assistance - Patient > 75% Stand Pivot Transfer Details: Verbal cues for sequencing;Verbal cues for safe use of DME/AE Stand Pivot Transfer Details (indicate cue type and reason):  cues for pivoting technique and assist to move WC behind pt due to inability to "hop" backwards Squat Pivot Transfers: Supervision/Verbal cueing Anterior-Posterior Transfer: Supervision/Verbal cueing Lateral/Scoot Transfers: Supervision/Verbal cueing Transfer (Assistive device): Rolling walker (for stand<>pivots and sit<>stands) Locomotion  Gait Ambulation: No Gait Gait: No Stairs / Additional Locomotion Stairs: No Corporate treasurer: Yes Wheelchair Assistance: Independent with Scientist, research (life sciences): Both upper extremities Wheelchair Parts Management: Supervision/cueing Distance: >362ft  Trunk/Postural Assessment  Cervical Assessment Cervical Assessment: Exceptions to [facility] (forward head) Thoracic Assessment Thoracic Assessment: Exceptions to [facility] (kyphosis) Lumbar Assessment Lumbar Assessment: Exceptions to [facility] (posterior pelvic tilt/sacral sitting) Postural Control Postural Control: Deficits on evaluation Righting Reactions: delayed on R Protective Responses: delayed on R  Balance Balance Balance Assessed: Yes Static Sitting Balance Static Sitting - Balance Support: Feet supported;Bilateral upper extremity supported Static Sitting - Level of Assistance: 7: Independent Dynamic Sitting Balance Dynamic Sitting - Balance Support: No upper extremity supported;Feet supported Dynamic Sitting - Level of Assistance: 6: Modified independent (Device/Increase time) Static Standing Balance Static Standing - Balance Support: Bilateral upper extremity supported;During functional activity (RW) Static Standing - Level of Assistance: 5: Stand by assistance (CGA) Dynamic Standing Balance Dynamic Standing - Balance Support: Bilateral upper extremity supported;During functional activity (RW) Dynamic Standing - Level of Assistance: 4: Min assist Dynamic Standing - Comments: with stand<>pivot transfers Extremity Assessment  RLE Assessment RLE Assessment:  Exceptions to [facility] General Strength Comments: tested sitting in WC - limited by pain RLE Strength Right Hip Flexion: 4-/5 Right Hip ABduction: 4/5 Right Hip ADduction: 4/5 Right Knee Flexion: 4-/5 Right Knee Extension: 3+/5 LLE Assessment LLE Assessment: Exceptions to [facility] General Strength Comments: tested sitting in WC LLE Strength Left Hip Flexion: 4/5 Left Hip ABduction: 4+/5 Left Hip ADduction: 4+/5 Left Knee Flexion: 4/5 Left Knee Extension: 4/5   Martin Majestic PT, DPT  08/10/2022, 7:10 AM

## 2022-08-10 NOTE — Progress Notes (Signed)
PROGRESS NOTE   Subjective/Complaints: Patient about to go to dialysis.  No additional complaints this morning.  He is looking forward to going home tomorrow.    ROS:  Pt denies SOB, abd pain, CP, N/V/C/D, and vision changes, +phantom limb pain-reports controlled  Objective:   No results found. Recent Labs    08/08/22 1546 08/10/22 1300  WBC 6.7 6.7  HGB 8.8* 9.1*  HCT 28.3* 29.5*  PLT 130* 127*    Recent Labs    08/08/22 1546 08/10/22 1300  NA 135 136  K 5.6* 6.8*  CL 99 96*  CO2 25 27  GLUCOSE 148* 150*  BUN 64* 48*  CREATININE 7.71* 7.00*  CALCIUM 8.2* 8.7*     Intake/Output Summary (Last 24 hours) at 08/10/2022 1538 Last data filed at 08/09/2022 1820 Gross per 24 hour  Intake 240 ml  Output --  Net 240 ml         Physical Exam: Vital Signs Blood pressure (!) 161/69, pulse 72, temperature 97.7 F (36.5 C), temperature source Oral, resp. rate (!) 22, height (P) 4\' 5"  (1.346 m), weight 77.1 kg, SpO2 99 %.  Gen: no distress, normal appearing, BMI 45.69 HEENT: oral mucosa pink and moist, NCAT Cardio: Reg rate Chest: CTAB, normal effort, normal rate of breathing Abd: soft, non-distended, positive bowel sounds Ext: no edema, left aVF Psych: pleasant, normal affect Skin: VAC off BKA- R BKA bulbous with shrinker in place- no drainage out of shrinker seen, red areas on left residual limb Neurologic: Cranial nerves II through XII intact, motor strength is 4/5 in bilateral deltoid, bicep, tricep, grip, hip flexor, L knee extensors, RIght knee ext not tested due to Wound vac, bilateral distal testing deferred due to bilateral BKA Sensory exam normal sensation to light touch except for left little finger sensation in bilateral upper  Musculoskeletal: Full range of motion bilateral upper extremities bilateral hand intrinsic minus deformities. Right BKA with wound VAC limiting examination, left knee range of  motion appears to be within functional limits wearing left BKA prosthetic Straps on prosthesis coming off     Assessment/Plan: 1. Functional deficits which require 3+ hours per day of interdisciplinary therapy in a comprehensive inpatient rehab setting. Physiatrist is providing close team supervision and 24 hour management of active medical problems listed below. Physiatrist and rehab team continue to assess barriers to discharge/monitor patient progress toward functional and medical goals  Care Tool:  Bathing    Body parts bathed by patient: Face, Right arm, Left arm, Chest, Abdomen, Right upper leg, Left upper leg, Front perineal area, Buttocks   Body parts bathed by helper: Buttocks, Front perineal area Body parts n/a: Right lower leg, Left lower leg   Bathing assist Assist Level: Supervision/Verbal cueing     Upper Body Dressing/Undressing Upper body dressing   What is the patient wearing?: Pull over shirt    Upper body assist Assist Level: Set up assist    Lower Body Dressing/Undressing Lower body dressing      What is the patient wearing?: Underwear/pull up, Pants, Orthosis     Lower body assist Assist for lower body dressing: Contact Guard/Touching assist     Toileting  Toileting    Toileting assist Assist for toileting: Contact Guard/Touching assist     Transfers Chair/bed transfer  Transfers assist     Chair/bed transfer assist level: Supervision/Verbal cueing     Locomotion Ambulation   Ambulation assist   Ambulation activity did not occur: Safety/medical concerns (generalized weakness/deconditioning, decreased balance, fatigue)          Walk 10 feet activity   Assist  Walk 10 feet activity did not occur: Safety/medical concerns (generalized weakness/deconditioning, decreased balance, fatigue)        Walk 50 feet activity   Assist Walk 50 feet with 2 turns activity did not occur: Safety/medical concerns (generalized  weakness/deconditioning, decreased balance, fatigue)         Walk 150 feet activity   Assist Walk 150 feet activity did not occur: Safety/medical concerns (generalized weakness/deconditioning, decreased balance, fatigue)         Walk 10 feet on uneven surface  activity   Assist Walk 10 feet on uneven surfaces activity did not occur: Safety/medical concerns (generalized weakness/deconditioning, decreased balance, fatigue)         Wheelchair     Assist Is the patient using a wheelchair?: Yes Type of Wheelchair: Manual    Wheelchair assist level: Independent Max wheelchair distance: 331ft    Wheelchair 50 feet with 2 turns activity    Assist        Assist Level: Independent   Wheelchair 150 feet activity     Assist      Assist Level: Independent   Blood pressure (!) 161/69, pulse 72, temperature 97.7 F (36.5 C), temperature source Oral, resp. rate (!) 22, height (P) 4\' 5"  (1.346 m), weight 77.1 kg, SpO2 99 %.  Medical Problem List and Plan: 1. Functional deficits secondary to right BKA secondary to abscess 07/26/2022 with wound VAC per Dr. Lajoyce Corners as well as history of left BKA 05/09/2021.  Patient does have a prosthesis           Con't CIR PT, and PT- limb guard and shrinker to RLE- wearing LLE prosthesis             -ELOS/Goals: 12-14d, Sup/minA level goals   -Continue plan for DC tomorrow, nephrology would like repeat labs in a.m. prior to discharge  -Per nephrology- labs tomorrow before DC 2.  Antithrombotics: -DVT/anticoagulation:  Pharmaceutical: Heparin             -antiplatelet therapy: N/A 3. Residual limb pain: N/A Oxycodone d/ced  4/27- Pt insists that Oxy be restarted, since didn't make any difference in itching- itching likely due to HD patient.   4/28- pain was doing better since restarted Oxy. Worse this AM, since went too long between meds  -5/4 reports pain is under control overall 4. Mood/Behavior/Sleep: Trazodone nightly as  needed             -antipsychotic agents: N/A 5. Neuropsych/cognition: This patient is capable of making decisions on his own behalf. 6. Skin/Wound Care: Routine skin checks and left BKA wound VAC may be removed 08/02/2022 7. Fluids/Electrolytes/Nutrition: Routine in and outs with follow-up chemistries 8.  End-stage renal disease.  Continue hemodialysis as directed 9.  Diabetes mellitus.  Latest hemoglobin A1c  5.0.  d/c SSI. Provided list of foods for good blood pressure control  4/27- BG's well controlled- con't regimen CBGs appear to have been discontinued, glucoses appear stable on renal panels  10.  Acute on chronic anemia.  Continue Aranesp 11.  Hypothyroidism.  Synthroid 12.  Hyperlipidemia.  Crestor 13.  CAD/CABG 2008 as well as history of atrial flutter with ablation 04/06/2020.  Follow-up outpatient with Dr. Ladona Ridgel 14.  History of prostate cancer TURP.  Follow-up outpatient 15.  Hypertension.  Monitor with increased mobility.  Patient had been on Coreg 12.5 mg twice daily, Cozaar 50 mg twice daily prior to admission on hold due to blood pressure being soft.  BP reviewed and continues to be soft so will hold 16. Difficulty swallowing pills: SLP consulted, vitamin C supplement d/ced since it is too large to swallow 17. Fatigue/daytime somnolence: continue modafinil to 200mg  daily 18. Straps on prosthesis coming off: appreciate [employer] eval 19. Dry eyes: eye drops started 20. Loose stools: discussed resolution with one time dose of imodium 21. Phantom limb pain: Lyrica increased to 25mg  TID and 50mg  HS,  Zanaflex added. Discussed that this has improved. Continue these medications  22. Hyponatremia: resolved, continue to monitor with HD labs  -5/4 sodium stable at 136 23. Dermatitis on left residual limb: [employer] notified, hydrocortisone cream ordered, continue  24. Hyperkalemia: continue nephrology management, monitor with HD labs  -5/4 Potassium elevated today, management per  nephrology, HD today, repeat labs tomorrow have been ordered     LOS: 12 days A FACE TO FACE EVALUATION WAS PERFORMED  Fanny Dance 08/10/2022, 3:38 PM

## 2022-08-10 NOTE — Progress Notes (Signed)
Occupational Therapy Session Note  Patient Details  Name: QUANTE TOMASO MRN: [id number] Date of Birth: 1951-02-01  Today's Date: 08/10/2022 OT Individual Time: 0700-0745 OT Individual Time Calculation (min): 45 min    Short Term Goals: Week 1:  OT Short Term Goal 1 (Week 1): Patient will complete stand pivot or squat pivot transfer to toilet/commode with min assist OT Short Term Goal 1 - Progress (Week 1): Met OT Short Term Goal 2 (Week 1): Patient will complete toilet hygiene with mod assist OT Short Term Goal 2 - Progress (Week 1): Met OT Short Term Goal 3 (Week 1): Patient will dress lower body with mod assist OT Short Term Goal 3 - Progress (Week 1): Met OT Short Term Goal 4 (Week 1): Patient will don/doff Leff prostheses with set up assistance in preparation for functional transfers OT Short Term Goal 4 - Progress (Week 1): Met OT Short Term Goal 5 (Week 1): Patient will don pull over shirt without physical assistance OT Short Term Goal 5 - Progress (Week 1): Met  Skilled Therapeutic Interventions/Progress Updates:     Pt received in bed with phantom pain in RLE, meds not available till 8am. Rest and repositiong provided for pain relief.   Therapeutic exercise Pt completes 3x1 min beach ball volley in seated position with 3 # dowel rod for dynamic balance, postural control, BUE strengthening and endurance required for BADLs and functional transfers.   Pt completes 2x10-15 dowel rod therex for BUE shoulder strengthening required for BADLs/functional transfers as follows with demo cuing and 8 # dowel rod  Shoulder flex/ext, shoulder press,  Elbow flex, chest press  Med ball therex for core/trunk/triceps required for functional transfers and BADLs. 5# med ball Overhead tricep extension Wood chop   Pt left at end of session in bed with exit alarm on, call light in reach and all needs met   Therapy Documentation Precautions:  Precautions Precautions: Fall Precaution Comments:  R LE wound vac Restrictions Weight Bearing Restrictions: Yes RLE Weight Bearing: Non weight bearing Other Position/Activity Restrictions: RLE  Therapy/Group: Individual Therapy  Shon Hale 08/10/2022, 6:44 AM

## 2022-08-10 NOTE — Plan of Care (Signed)
  Problem: Consults Goal: RH LIMB LOSS PATIENT EDUCATION Description: Description: See Patient Education module for eduction specifics. Outcome: Progressing   Problem: RH BOWEL ELIMINATION Goal: RH STG MANAGE BOWEL WITH ASSISTANCE Description: STG Manage Bowel with mod I  Assistance. Outcome: Progressing Goal: RH STG MANAGE BOWEL W/MEDICATION W/ASSISTANCE Description: STG Manage Bowel with Medication with mod I Assistance. Outcome: Progressing   Problem: RH SKIN INTEGRITY Goal: RH STG SKIN FREE OF INFECTION/BREAKDOWN Description: With min assist Outcome: Progressing Goal: RH STG ABLE TO PERFORM INCISION/WOUND CARE W/ASSISTANCE Description: STG Able To Perform Incision/Wound Care With min Assistance. Outcome: Progressing   Problem: RH SAFETY Goal: RH STG ADHERE TO SAFETY PRECAUTIONS W/ASSISTANCE/DEVICE Description: STG Adhere to Safety Precautions With cues Assistance/Device. Outcome: Progressing   Problem: RH PAIN MANAGEMENT Goal: RH STG PAIN MANAGED AT OR BELOW PT'S PAIN GOAL Outcome: Progressing   Problem: RH KNOWLEDGE DEFICIT LIMB LOSS Goal: RH STG INCREASE KNOWLEDGE OF SELF CARE AFTER LIMB LOSS Description: Patient and family will be able to manage self care, medications and dietary modifications using educational resources independently Outcome: Progressing   Problem: RH PAIN MANAGEMENT Goal: RH STG PAIN MANAGED AT OR BELOW PT'S PAIN GOAL Outcome: Progressing   Problem: RH KNOWLEDGE DEFICIT LIMB LOSS Goal: RH STG INCREASE KNOWLEDGE OF SELF CARE AFTER LIMB LOSS Description: Patient and family will be able to manage self care, medications and dietary modifications using educational resources independently Outcome: Progressing

## 2022-08-11 DIAGNOSIS — I10 Essential (primary) hypertension: Secondary | ICD-10-CM

## 2022-08-11 LAB — RENAL FUNCTION PANEL
Albumin: 3.4 g/dL — ABNORMAL LOW (ref 3.5–5.0)
Anion gap: 15 (ref 5–15)
BUN: 27 mg/dL — ABNORMAL HIGH (ref 8–23)
CO2: 30 mmol/L (ref 22–32)
Calcium: 9 mg/dL (ref 8.9–10.3)
Chloride: 91 mmol/L — ABNORMAL LOW (ref 98–111)
Creatinine, Ser: 5.03 mg/dL — ABNORMAL HIGH (ref 0.61–1.24)
GFR, Estimated: 12 mL/min — ABNORMAL LOW (ref >60)
Glucose, Bld: 154 mg/dL — ABNORMAL HIGH (ref 70–99)
Phosphorus: 4 mg/dL (ref 2.5–4.6)
Potassium: 5 mmol/L (ref 3.5–5.1)
Sodium: 136 mmol/L (ref 135–145)

## 2022-08-11 NOTE — Progress Notes (Signed)
Inpatient Rehabilitation Discharge Medication Review by a Pharmacist   A complete drug regimen review was completed for this patient to identify any potential clinically significant medication issues.   High Risk Drug Classes Is patient taking? Indication by Medication  Antipsychotic No    Anticoagulant No    Antibiotic No    Opioid Yes Oxycodone - moderate pain  Antiplatelet No    Hypoglycemics/insulin Yes Ozempic - DM  Vasoactive Medication Yes Coreg-HTN  Chemotherapy No    Other Yes Synthroid - hypothyroid Protonix - GERD Vitamin D, zinc, vitamin C - vitamin repletion Allopurinol - gout ppx Modafinil - daytime somnolence Lyrica, tizanidine - phantom limb pain Aranesp - anemia Crestor- HLD        Type of Medication Issue Identified Description of Issue Recommendation(s)  Drug Interaction(s) (clinically significant)        Duplicate Therapy        Allergy        No Medication Administration End Date        Incorrect Dose        Additional Drug Therapy Needed        Significant med changes from prior encounter (inform family/care partners about these prior to discharge).      Other   PTA meds not resumed during admission: Renvela, losartan, Mircera transitioned to Aranesp per formulary equivalent Restart PTA meds when and if necessary during CIR admission or at time of discharge, if warranted      Clinically significant medication issues were identified that warrant physician communication and completion of prescribed/recommended actions by midnight of the next day:  No   Time spent performing this drug regimen review (minutes):  15   Thank you for involving pharmacy in this patient's care.   Rockwell Alexandria, PharmD PGY1 Pharmacy Resident 08/11/2022 7:26 AM

## 2022-08-11 NOTE — Progress Notes (Signed)
PROGRESS NOTE   Subjective/Complaints: No new concerns this AM. K+ improved to 5.0 today.     ROS:  Pt denies SOB, abd pain, CP, N/V/C/D, HA, palpitations and vision changes, +phantom limb pain-reports controlled  Objective:   No results found. Recent Labs    08/10/22 1300  WBC 6.7  HGB 9.1*  HCT 29.5*  PLT 127*    Recent Labs    08/10/22 1300 08/11/22 0747  NA 136 136  K 6.8* 5.0  CL 96* 91*  CO2 27 30  GLUCOSE 150* 154*  BUN 48* 27*  CREATININE 7.00* 5.03*  CALCIUM 8.7* 9.0     Intake/Output Summary (Last 24 hours) at 08/11/2022 1945 Last data filed at 08/11/2022 0857 Gross per 24 hour  Intake 240 ml  Output 50 ml  Net 190 ml         Physical Exam: Vital Signs Blood pressure (!) 151/53, pulse 69, temperature 97.9 F (36.6 C), resp. rate 20, height (P) 4\' 5"  (1.346 m), weight 83.1 kg, SpO2 99 %.  Gen: no distress, normal appearing, BMI 45.69. seen at bedside HEENT: oral mucosa pink and moist, NCAT Cardio: RRR  Chest: CTAB, normal effort, normal rate of breathing Abd: soft, non-distended, positive bowel sounds Ext: no edema, left aVF Psych: pleasant, normal affect Skin: VAC off BKA- R BKA bulbous No signs of infection noted Neurologic: Cranial nerves II through XII intact, motor strength is 4/5 in bilateral deltoid, bicep, tricep, grip, hip flexor, L knee extensors Sensory exam normal sensation to light touch except for left little finger sensation in bilateral upper  Musculoskeletal: Full range of motion bilateral upper extremities bilateral hand intrinsic minus deformities. Wearing left BKA prosthetic      Assessment/Plan: 1. Functional deficits which require 3+ hours per day of interdisciplinary therapy in a comprehensive inpatient rehab setting. Physiatrist is providing close team supervision and 24 hour management of active medical problems listed below. Physiatrist and rehab team  continue to assess barriers to discharge/monitor patient progress toward functional and medical goals  Care Tool:  Bathing    Body parts bathed by patient: Face, Right arm, Left arm, Chest, Abdomen, Right upper leg, Left upper leg, Front perineal area, Buttocks   Body parts bathed by helper: Buttocks, Front perineal area Body parts n/a: Right lower leg, Left lower leg   Bathing assist Assist Level: Supervision/Verbal cueing     Upper Body Dressing/Undressing Upper body dressing   What is the patient wearing?: Pull over shirt    Upper body assist Assist Level: Set up assist    Lower Body Dressing/Undressing Lower body dressing      What is the patient wearing?: Underwear/pull up, Pants, Orthosis     Lower body assist Assist for lower body dressing: Contact Guard/Touching assist     Toileting Toileting    Toileting assist Assist for toileting: Contact Guard/Touching assist     Transfers Chair/bed transfer  Transfers assist     Chair/bed transfer assist level: Supervision/Verbal cueing     Locomotion Ambulation   Ambulation assist   Ambulation activity did not occur: Safety/medical concerns (generalized weakness/deconditioning, decreased balance, fatigue)  Walk 10 feet activity   Assist  Walk 10 feet activity did not occur: Safety/medical concerns (generalized weakness/deconditioning, decreased balance, fatigue)        Walk 50 feet activity   Assist Walk 50 feet with 2 turns activity did not occur: Safety/medical concerns (generalized weakness/deconditioning, decreased balance, fatigue)         Walk 150 feet activity   Assist Walk 150 feet activity did not occur: Safety/medical concerns (generalized weakness/deconditioning, decreased balance, fatigue)         Walk 10 feet on uneven surface  activity   Assist Walk 10 feet on uneven surfaces activity did not occur: Safety/medical concerns (generalized weakness/deconditioning,  decreased balance, fatigue)         Wheelchair     Assist Is the patient using a wheelchair?: Yes Type of Wheelchair: Manual    Wheelchair assist level: Independent Max wheelchair distance: 346ft    Wheelchair 50 feet with 2 turns activity    Assist        Assist Level: Independent   Wheelchair 150 feet activity     Assist      Assist Level: Independent   Blood pressure (!) 151/53, pulse 69, temperature 97.9 F (36.6 C), resp. rate 20, height (P) 4\' 5"  (1.346 m), weight 83.1 kg, SpO2 99 %.  Medical Problem List and Plan: 1. Functional deficits secondary to right BKA secondary to abscess 07/26/2022 with wound VAC per Dr. Lajoyce Corners as well as history of left BKA 05/09/2021.  Patient does have a prosthesis           Con't CIR PT, and PT- limb guard and shrinker to RLE- wearing LLE prosthesis             -ELOS/Goals: 12-14d, Sup/minA level goals   -Continue plan for DC tomorrow, nephrology would like repeat labs in a.m. prior to discharge  -Per nephrology- labs tomorrow before DC  -OK to DC today 2.  Antithrombotics: -DVT/anticoagulation:  Pharmaceutical: Heparin             -antiplatelet therapy: N/A 3. Residual limb pain: N/A Oxycodone d/ced  4/27- Pt insists that Oxy be restarted, since didn't make any difference in itching- itching likely due to HD patient.   4/28- pain was doing better since restarted Oxy. Worse this AM, since went too long between meds  -5/5 pain controlled, continue oxycodone prn 4. Mood/Behavior/Sleep: Trazodone nightly as needed             -antipsychotic agents: N/A 5. Neuropsych/cognition: This patient is capable of making decisions on his own behalf. 6. Skin/Wound Care: Routine skin checks and left BKA wound VAC may be removed 08/02/2022 7. Fluids/Electrolytes/Nutrition: Routine in and outs with follow-up chemistries 8.  End-stage renal disease.  Continue hemodialysis as directed 9.  Diabetes mellitus.  Latest hemoglobin A1c  5.0.  d/c  SSI. Provided list of foods for good blood pressure control  4/27- BG's well controlled- con't regimen CBGs appear to have been discontinued, glucoses appear stable on renal panels  10.  Acute on chronic anemia.  Continue Aranesp  -Last HGB 9.1, follow with PCP for recheck 11.  Hypothyroidism.  Synthroid 12.  Hyperlipidemia.  Crestor 13.  CAD/CABG 2008 as well as history of atrial flutter with ablation 04/06/2020.  Follow-up outpatient with Dr. Ladona Ridgel. Denies CP 14.  History of prostate cancer TURP.  Follow-up outpatient 15.  Hypertension.  Monitor with increased mobility.  Patient had been on Coreg 12.5 mg twice daily, Cozaar 50  mg twice daily prior to admission on hold due to blood pressure being soft.  BP reviewed and continues to be soft so will hold  BP stable, F/U with PCP     08/11/2022    8:02 AM 08/11/2022    4:26 AM 08/10/2022    7:43 PM  Vitals with BMI  Weight  183 lbs 3 oz   BMI  45.87   Systolic 151 155 161  Diastolic 53 57 52  Pulse 69 69 63    16. Difficulty swallowing pills: SLP consulted, vitamin C supplement d/ced since it is too large to swallow 17. Fatigue/daytime somnolence: continue modafinil to 200mg  daily 18. Straps on prosthesis coming off: appreciate [facility] eval 19. Dry eyes: eye drops started 20. Loose stools: discussed resolution with one time dose of imodium 21. Phantom limb pain: Lyrica increased to 25mg  TID and 50mg  HS,  Zanaflex added. Discussed that this has improved. Continue these medications  22. Hyponatremia: resolved, continue to monitor with HD labs  -5/5 sodium still stable at 136 23. Dermatitis on left residual limb: [facility] notified, hydrocortisone cream ordered, continue  24. Hyperkalemia: continue nephrology management, monitor with HD labs  -5/4 Potassium elevated today, management per nephrology, HD today, repeat labs tomorrow have been ordered  5/5 K+ down to 5.0, continue f/u nephrology for HD, ok to DC     LOS: 13 days A FACE TO  FACE EVALUATION WAS PERFORMED  Fanny Dance 08/11/2022, 7:45 PM

## 2022-08-11 NOTE — Plan of Care (Signed)
  Problem: Consults Goal: RH LIMB LOSS PATIENT EDUCATION Description: Description: See Patient Education module for eduction specifics. Outcome: Completed/Met   Problem: RH BOWEL ELIMINATION Goal: RH STG MANAGE BOWEL WITH ASSISTANCE Description: STG Manage Bowel with mod I  Assistance. Outcome: Completed/Met Goal: RH STG MANAGE BOWEL W/MEDICATION W/ASSISTANCE Description: STG Manage Bowel with Medication with mod I Assistance. Outcome: Completed/Met   Problem: RH SKIN INTEGRITY Goal: RH STG SKIN FREE OF INFECTION/BREAKDOWN Description: With min assist Outcome: Completed/Met Goal: RH STG ABLE TO PERFORM INCISION/WOUND CARE W/ASSISTANCE Description: STG Able To Perform Incision/Wound Care With min Assistance. Outcome: Completed/Met   Problem: RH SAFETY Goal: RH STG ADHERE TO SAFETY PRECAUTIONS W/ASSISTANCE/DEVICE Description: STG Adhere to Safety Precautions With cues Assistance/Device. Outcome: Completed/Met   Problem: RH PAIN MANAGEMENT Goal: RH STG PAIN MANAGED AT OR BELOW PT'S PAIN GOAL Outcome: Completed/Met   Problem: RH KNOWLEDGE DEFICIT LIMB LOSS Goal: RH STG INCREASE KNOWLEDGE OF SELF CARE AFTER LIMB LOSS Description: Patient and family will be able to manage self care, medications and dietary modifications using educational resources independently Outcome: Completed/Met

## 2022-08-11 NOTE — Progress Notes (Signed)
[facility] Progress Note   Subjective: Hyperkalemia noted yesterday. HD on 1.0 K bath. K+ 5.0 this AM. For discharge home.     Objective Vitals:   08/10/22 1811 08/10/22 1943 08/11/22 0426 08/11/22 0802  BP: (!) 157/83 (!) 138/52 (!) 155/57 (!) 151/53  Pulse: 67 63 69 69  Resp: 18 18 20    Temp: 97.8 F (36.6 C) 97.6 F (36.4 C) 97.9 F (36.6 C)   TempSrc: Oral     SpO2: 99% 100% 99%   Weight:   83.1 kg   Height:       Physical Exam General: Chronically ill appearing male in NAD Heart: S1,S2 2/6 systolic M Lungs: CTAB Abdomen: NABS, NT Extremities:  R BKA in stump protector. L BKA with prosthesis Dialysis Access: L AVF + T/B  Additional Objective Labs: Basic Metabolic Panel: Recent Labs  Lab 08/08/22 1546 08/10/22 1300 08/11/22 0747  NA 135 136 136  K 5.6* 6.8* 5.0  CL 99 96* 91*  CO2 25 27 30   GLUCOSE 148* 150* 154*  BUN 64* 48* 27*  CREATININE 7.71* 7.00* 5.03*  CALCIUM 8.2* 8.7* 9.0  PHOS 3.8 3.1 4.0   Liver Function Tests: Recent Labs  Lab 08/08/22 1546 08/10/22 1300 08/11/22 0747  ALBUMIN 3.1* 3.2* 3.4*   No results for input(s): "LIPASE", "AMYLASE" in the last 168 hours. CBC: Recent Labs  Lab 08/06/22 1200 08/08/22 1546 08/10/22 1300  WBC 7.5 6.7 6.7  HGB 9.1* 8.8* 9.1*  HCT 27.4* 28.3* 29.5*  MCV 90.4 94.6 94.9  PLT 133* 130* 127*   Blood Culture    Component Value Date/Time   SDES  07/22/2022 1835    BLOOD BLOOD RIGHT ARM Performed at [facility], [address] [place], [place] [address]    [facility]  07/22/2022 1835    Blood Culture adequate volume BOTTLES DRAWN AEROBIC AND ANAEROBIC Performed at [facility], [address], [place], [place] [address]    CULT  07/22/2022 1835    NO GROWTH 5 DAYS Performed at [facility], [address], [place], [place] [address]    REPTSTATUS 07/27/2022 FINAL 07/22/2022 1835    Cardiac Enzymes: No results for input(s):  "CKTOTAL", "CKMB", "CKMBINDEX", "TROPONINI" in the last 168 hours. CBG: Recent Labs  Lab 08/04/22 1120 08/04/22 1706 08/05/22 0557  GLUCAP 100* 110* 103*   Iron Studies: No results for input(s): "IRON", "TIBC", "TRANSFERRIN", "FERRITIN" in the last 72 hours. @lablastinr3 @ Studies/Results: No results found. Medications:   carvedilol  12.5 mg Oral BID WC   Chlorhexidine Gluconate Cloth  6 each Topical Q0600   Chlorhexidine Gluconate Cloth  6 each Topical Q0600   darbepoetin (ARANESP) injection - DIALYSIS  100 mcg Subcutaneous Q Sat-1800   enoxaparin (LOVENOX) injection  30 mg Subcutaneous Q24H   hydrocortisone cream   Topical BID   levothyroxine  112 mcg Oral Q0600   modafinil  200 mg Oral Daily   pantoprazole  40 mg Oral QHS   pregabalin  25 mg Oral TID   pregabalin  50 mg Oral QHS   rosuvastatin  10 mg Oral Daily     Outpatient Dialysis Orders: [facility] (Dr. Signe Colt follows)- Sun - Wed - Fri 3:48hr, BFR 425, EDW 88kg, L AVF, no heparin - Venofer 100mg  IV weekly.   Assessment/Plan: 1. RLE cellulitis:  right BKA 4/19 Dr. Lajoyce Corners, had wound VAC, antibiotics complete/CIR admit  2. Acute Hypoxic Resp Failure: Admit intubated / extubated 4/16 /stable now on room  air.  UF as tolerated 3. ESRD: [facility] / in hosp TTS schedule while here. Next HD today.    4. HTN/volume: BP stable on HD, variable bed weights. Will need a new EDW s/p amputation.  5. Anemia: s/p 2 units PRBCs 4/18. Aranesp started 4/18.HGB 8.8 today. ESA due 08/10/2022.  6. Secondary hyperparathyroidism:  CorrCa controlled, not on VDRA. PO4 at goal.  7. Nutrition: Albumin low, stable. On protein supplements. on protein supplements.  8. T2DM: per admitting team 9. Hyperkalemia: Pt has been on regular diet. Changed to renal diet with fluid restrictions. Repeat labs today K+ 6.8. Educated about renal diet. On 1.0 K bath for 2 hours then switch back to 2.0 K bath for rest of tx. Repeat K+ this AM 5.0.    Disposition: Home  today. He will resume usual home HD schedule.   Natalio Salois H. Nitza Schmid NP-C 08/11/2022, 10:36 AM  [employer] [phone]

## 2022-08-12 ENCOUNTER — Telehealth: Payer: Self-pay | Admitting: Orthopedic Surgery

## 2022-08-12 NOTE — Telephone Encounter (Signed)
Patient demanding to see Dr. Lajoyce Corners, has not had a P/O appointment. Offered to see Denny Peon Wednesday and refused to make appointment . Demanding to speak to Autumn before scheduling. Please call

## 2022-08-12 NOTE — Progress Notes (Signed)
Late Note Entry  Pt d/c to home yesterday from rehab. Staff at [facility] home therapies advised last week that pt will d/c home on 5/5. Staff will f/u with pt post d/c to resume home HD.   Olivia Canter Renal Navigator [phone]

## 2022-08-12 NOTE — Telephone Encounter (Signed)
One opened up for tomorrow, no call needed

## 2022-08-13 ENCOUNTER — Encounter: Payer: Medicare Other | Admitting: Orthopedic Surgery

## 2022-08-13 ENCOUNTER — Ambulatory Visit (INDEPENDENT_AMBULATORY_CARE_PROVIDER_SITE_OTHER): Payer: Medicare Other | Admitting: Orthopedic Surgery

## 2022-08-13 DIAGNOSIS — Z89511 Acquired absence of right leg below knee: Secondary | ICD-10-CM

## 2022-08-13 DIAGNOSIS — S88111A Complete traumatic amputation at level between knee and ankle, right lower leg, initial encounter: Secondary | ICD-10-CM

## 2022-08-13 DIAGNOSIS — Z89512 Acquired absence of left leg below knee: Secondary | ICD-10-CM

## 2022-08-13 DIAGNOSIS — S88112A Complete traumatic amputation at level between knee and ankle, left lower leg, initial encounter: Secondary | ICD-10-CM

## 2022-08-19 ENCOUNTER — Telehealth: Payer: Self-pay | Admitting: Radiology

## 2022-08-19 NOTE — Telephone Encounter (Signed)
Delos Haring is calling to get verbal PT auth for 1-2 times a week for up to 8 weeks. Please call him back @ [phone]

## 2022-08-19 NOTE — Telephone Encounter (Signed)
Called and lm on vm for Dorian to advise verbal ok for orders below and to call with any questions.

## 2022-08-20 ENCOUNTER — Ambulatory Visit (INDEPENDENT_AMBULATORY_CARE_PROVIDER_SITE_OTHER): Payer: Medicare Other | Admitting: Orthopedic Surgery

## 2022-08-20 DIAGNOSIS — S88112A Complete traumatic amputation at level between knee and ankle, left lower leg, initial encounter: Secondary | ICD-10-CM

## 2022-08-20 DIAGNOSIS — Z89511 Acquired absence of right leg below knee: Secondary | ICD-10-CM

## 2022-08-20 DIAGNOSIS — S88111A Complete traumatic amputation at level between knee and ankle, right lower leg, initial encounter: Secondary | ICD-10-CM

## 2022-08-20 DIAGNOSIS — Z89512 Acquired absence of left leg below knee: Secondary | ICD-10-CM

## 2022-08-27 ENCOUNTER — Encounter: Payer: Self-pay | Admitting: Orthopedic Surgery

## 2022-08-27 NOTE — Progress Notes (Signed)
Office Visit Note   Patient: [name]           Date of Birth: 08-29-1950           MRN: [id number] Visit Date: 08/13/2022              Requested by: Adolph Pollack, FNP [id number] [place],  [place] [address] PCP: Adolph Pollack, FNP  Chief Complaint  Patient presents with   Right Leg - Routine Post Op    07-26-22 Right BKA      HPI: Patient is a 72 year old gentleman who is status post right transtibial amputation 3 weeks out.  The wound VAC was removed on April 26 and inpatient rehab.  Currently wearing a 3 XL shrinker.  Assessment & Plan: Visit Diagnoses:  1. Below-knee amputation of left lower extremity, initial encounter (HCC)   2. Below-knee amputation of right lower extremity, initial encounter [facility])     Plan: Recommended to Weck-Cel shrinker and this was given to him.  All of his staples in a week.  Follow-Up Instructions: Return in about 1 week (around 08/20/2022).   Ortho Exam  Patient is alert, oriented, no adenopathy, well-dressed, normal affect, normal respiratory effort. Examination the wound is well-approximated no cellulitis no drainage no ischemic changes.  Imaging: No results found.   Labs: Lab Results  Component Value Date   HGBA1C 5.0 05/17/2022   HGBA1C 5.5 05/04/2021   HGBA1C 6.3 (H) 02/02/2021   ESRSEDRATE 34 (H) 01/24/2020   ESRSEDRATE 41 (H) 01/10/2020   ESRSEDRATE 55 (H) 12/27/2019   CRP 8.8 (H) 01/24/2020   CRP 35.9 (H) 01/10/2020   CRP 77.9 (H) 12/27/2019   REPTSTATUS 07/27/2022 FINAL 07/22/2022   GRAMSTAIN  05/17/2022    ABUNDANT WBC PRESENT, PREDOMINANTLY PMN ABUNDANT GRAM POSITIVE COCCI ABUNDANT GRAM NEGATIVE RODS    CULT  07/22/2022    NO GROWTH 5 DAYS Performed at [facility], [address] [place], [place] [address]    LABORGA PROTEUS MIRABILIS 05/17/2022   LABORGA ENTEROCOCCUS FAECALIS 05/17/2022   LABORGA STAPHYLOCOCCUS AUREUS 05/17/2022     Lab Results  Component Value Date    ALBUMIN 3.4 (L) 08/11/2022   ALBUMIN 3.2 (L) 08/10/2022   ALBUMIN 3.1 (L) 08/08/2022   PREALBUMIN 32.7 05/09/2021   PREALBUMIN 14.8 (L) 12/14/2018    Lab Results  Component Value Date   MG 2.0 07/25/2022   MG 1.6 (L) 07/23/2022   MG 2.4 05/14/2021   Lab Results  Component Value Date   VD25OH 42.0 11/19/2021   VD25OH 16.13 (L) 05/09/2021    Lab Results  Component Value Date   PREALBUMIN 32.7 05/09/2021   PREALBUMIN 14.8 (L) 12/14/2018      Latest Ref Rng & Units 08/10/2022    1:00 PM 08/08/2022    3:46 PM 08/06/2022   12:00 PM  CBC EXTENDED  WBC 4.0 - 10.5 K/uL 6.7  6.7  7.5   RBC 4.22 - 5.81 MIL/uL 3.11  2.99  3.03   Hemoglobin 13.0 - 17.0 g/dL 9.1  8.8  9.1   HCT 56.2 - 52.0 % 29.5  28.3  27.4   Platelets 150 - 400 K/uL 127  130  133      There is no height or weight on file to calculate BMI.  Orders:  No orders of the defined types were placed in this encounter.  No orders of the defined types were placed in this encounter.  Procedures: No procedures performed  Clinical Data: No additional findings.  ROS:  All other systems negative, except as noted in the HPI. Review of Systems  Objective: Vital Signs: There were no vitals taken for this visit.  Specialty Comments:  No specialty comments available.  PMFS History: Patient Active Problem List   Diagnosis Date Noted   Right below-knee amputee (HCC) 07/29/2022   Severe sepsis (HCC) 07/23/2022   Foot infection 07/22/2022   Acute respiratory failure (HCC) 07/22/2022   Anemia due to chronic blood loss 06/23/2022   Heme positive stool 06/23/2022   Benign neoplasm of cecum 06/23/2022   Benign neoplasm of transverse colon 06/23/2022   Benign neoplasm of descending colon 06/23/2022   Abscess of right foot 05/17/2022   Contraindication to anticoagulation therapy GU Bleed 03/13/2022   Paroxysmal atrial fibrillation (HCC) 08/18/2021   Anemia of chronic renal failure 08/18/2021   Thrombocytopenia (HCC)  08/18/2021   Acute metabolic encephalopathy 08/18/2021   Action induced myoclonus 08/18/2021   Left below-knee amputee (HCC) 05/11/2021   Symptomatic anemia 09/13/2020   COVID-19 virus infection 09/13/2020   Pressure injury of skin 06/29/2020   Gross hematuria 06/16/2020   Typical atrial flutter (HCC) 03/24/2020   Bilateral pleural effusion 02/24/2020   Healthcare maintenance 01/28/2020   Shortness of breath 01/28/2020   History of partial ray amputation of fourth toe of right foot (HCC) 09/08/2019   Chronic cough 06/25/2019   Cutaneous abscess of right foot    Subacute osteomyelitis, right ankle and foot (HCC) 12/14/2018   AKI (acute kidney injury) (HCC) 12/14/2018   ESRD on dialysis (HCC) 12/14/2018   S/P CABG (coronary artery bypass graft) 12/11/2018   Gait abnormality 08/24/2018   Diabetic neuropathy (HCC) 02/06/2018   Cervical myelopathy (HCC) 02/06/2018   Onychomycosis 10/30/2017   Other spondylosis with radiculopathy, cervical region 01/27/2017   Midfoot ulcer, right, limited to breakdown of skin (HCC) 11/15/2016   Lateral epicondylitis, left elbow 08/12/2016   Prostate cancer (HCC) 06/19/2016   Cellulitis of right foot 12/24/2015   Gout 09/14/2012   Hypertension associated with diabetes (HCC) 09/14/2012   Hypothyroidism 09/14/2012   CAD (coronary artery disease) 09/14/2012   Insulin dependent type 2 diabetes mellitus (HCC) 09/14/2012   Hyperlipidemia associated with type 2 diabetes mellitus (HCC)    Neuropathy (HCC)    Past Medical History:  Diagnosis Date   Acute osteomyelitis of metatarsal bone of left foot (HCC)    Ambulates with cane    straight cane   Anemia    Cervical myelopathy (HCC) 02/06/2018   Chronic kidney disease    dailysis [facility]- home   Community acquired pneumonia of left lower lobe of lung 08/18/2021   Complication of anesthesia    Coronary artery disease    Dehiscence of amputation stump of left lower extremity (HCC)    Diabetes mellitus  without complication (HCC)    type2   Diabetic foot ulcer (HCC)    Diabetic neuropathy (HCC) 02/06/2018   Dysrhythmia    Gait abnormality 08/24/2018   GERD (gastroesophageal reflux disease)     01/06/20- not current   Gout    History of blood transfusion    History of blood transfusion    History of kidney stones    passed stones   Hypercholesteremia    Hypertension    Hypothyroidism    Neuromuscular disorder (HCC)    neuropathy left leg and bilateral feet   Neuropathy    Partial nontraumatic amputation of foot, left (  HCC) 02/20/2021   PONV (postoperative nausea and vomiting)    Prostate cancer (HCC)    PVD (peripheral vascular disease) (HCC)    with amputations    Family History  Problem Relation Age of Onset   Diabetes Mellitus II Mother    Kidney disease Mother    Diabetes Mellitus II Father    CAD Father    Cancer Father        prostate   Kidney disease Father    Diabetes Mellitus II Brother    Kidney disease Brother    Diabetes Mellitus II Brother    Stomach cancer Brother 30   Kidney disease Brother    Colon cancer Neg Hx    Colon polyps Neg Hx    Esophageal cancer Neg Hx    Rectal cancer Neg Hx    Pancreatic cancer Neg Hx     Past Surgical History:  Procedure Laterality Date   A-FLUTTER ABLATION N/A 04/06/2020   Procedure: A-FLUTTER ABLATION;  Surgeon: Marinus Maw, MD;  Location: [facility];  Service: Cardiovascular;  Laterality: N/A;   AMPUTATION Left 12/25/2013   Procedure: AMPUTATION RAY LEFT 5TH RAY;  Surgeon: Nadara Mustard, MD;  Location: [facility] ORS;  Service: Orthopedics;  Laterality: Left;   AMPUTATION Right 12/15/2018   Procedure: AMPUTATION OF 4TH AND 5TH TOES RIGHT FOOT;  Surgeon: Nadara Mustard, MD;  Location: [facility] OR;  Service: Orthopedics;  Laterality: Right;   AMPUTATION Left 02/02/2021   Procedure: LEFT FOOT 4TH RAY AMPUTATION;  Surgeon: Nadara Mustard, MD;  Location: [facility] OR;  Service: Orthopedics;  Laterality: Left;   AMPUTATION Left  05/09/2021   Procedure: LEFT BELOW KNEE AMPUTATION;  Surgeon: Nadara Mustard, MD;  Location: [facility] OR;  Service: Orthopedics;  Laterality: Left;   AMPUTATION Right 07/26/2022   Procedure: RIGHT BELOW KNEE AMPUTATION;  Surgeon: Nadara Mustard, MD;  Location: [facility] OR;  Service: Orthopedics;  Laterality: Right;   APPLICATION OF WOUND VAC Right 12/15/2018   Procedure: APPLICATION OF WOUND VAC;  Surgeon: Nadara Mustard, MD;  Location: [facility] OR;  Service: Orthopedics;  Laterality: Right;   AV FISTULA PLACEMENT Left 03/25/2019   Procedure: LEFT ARM ARTERIOVENOUS (AV) FISTULA CREATION;  Surgeon: Nada Libman, MD;  Location: [facility] OR;  Service: Vascular;  Laterality: Left;   BACK SURGERY     BASCILIC VEIN TRANSPOSITION Left 05/20/2019   Procedure: SECOND STAGE LEFT BASCILIC VEIN TRANSPOSITION;  Surgeon: Nada Libman, MD;  Location: [facility] OR;  Service: Vascular;  Laterality: Left;   BIOPSY  06/22/2022   Procedure: BIOPSY;  Surgeon: Lemar Lofty., MD;  Location: [facility] ENDOSCOPY;  Service: Gastroenterology;;   CARDIAC CATHETERIZATION  02/17/2014   CHOLECYSTECTOMY     COLONOSCOPY  2011   in [place], Normal   COLONOSCOPY N/A 06/23/2022   Procedure: COLONOSCOPY;  Surgeon: Sherrilyn Rist, MD;  Location: [facility] ENDOSCOPY;  Service: Gastroenterology;  Laterality: N/A;   CORONARY ARTERY BYPASS GRAFT  2008   CYSTOSCOPY N/A 06/29/2020   Procedure: CYSTOSCOPY WITH CLOT EVACUATION AND  FULGERATION;  Surgeon: Marcine Matar, MD;  Location: [facility] OR;  Service: Urology;  Laterality: N/A;   CYSTOSCOPY WITH FULGERATION Bilateral 06/17/2020   Procedure: CYSTOSCOPY,BILATERAL RETROGRADE, CLOT EVACUATION WITH FULGERATION OF THE BLADDER;  Surgeon: Jannifer Hick, MD;  Location: [facility] OR;  Service: Urology;  Laterality: Bilateral;   CYSTOSCOPY WITH FULGERATION N/A 06/28/2020   Procedure: CYSTOSCOPY WITH CLOT EVACUATION AND FULGERATION OF BLEEDERS;  Surgeon: Retta Diones,  Jeannett Senior, MD;  Location: [facility] OR;  Service: Urology;  Laterality:  N/A;  1 HR   ENTEROSCOPY N/A 06/22/2022   Procedure: ENTEROSCOPY;  Surgeon: Meridee Score Netty Starring., MD;  Location: [facility] ENDOSCOPY;  Service: Gastroenterology;  Laterality: N/A;   HEMOSTASIS CLIP PLACEMENT  06/22/2022   Procedure: HEMOSTASIS CLIP PLACEMENT;  Surgeon: Lemar Lofty., MD;  Location: [name] ENDOSCOPY;  Service: Gastroenterology;;   HOT HEMOSTASIS N/A 06/22/2022   Procedure: HOT HEMOSTASIS (ARGON PLASMA COAGULATION/BICAP);  Surgeon: Lemar Lofty., MD;  Location: [facility] ENDOSCOPY;  Service: Gastroenterology;  Laterality: N/A;   I & D EXTREMITY Right 12/15/2018   Procedure: DEBRIDEMENT RIGHT FOOT;  Surgeon: Nadara Mustard, MD;  Location: [facility] OR;  Service: Orthopedics;  Laterality: Right;   I & D EXTREMITY Right 08/27/2019   Procedure: PARTIAL CUBOID EXCISION RIGHT FOOT;  Surgeon: Nadara Mustard, MD;  Location: [facility] OR;  Service: Orthopedics;  Laterality: Right;   I & D EXTREMITY Right 01/07/2020   Procedure: RIGHT FOOT EXCISION INFECTED BONE;  Surgeon: Nadara Mustard, MD;  Location: [facility] OR;  Service: Orthopedics;  Laterality: Right;   I & D EXTREMITY Right 05/17/2022   Procedure: IRRIGATION AND DEBRIDEMENT RIGHT FOOT ABSCESS;  Surgeon: Nadara Mustard, MD;  Location: [facility] OR;  Service: Orthopedics;  Laterality: Right;   NECK SURGERY     novemver 2019   POLYPECTOMY  06/22/2022   Procedure: POLYPECTOMY;  Surgeon: Mansouraty, Netty Starring., MD;  Location: [facility] ENDOSCOPY;  Service: Gastroenterology;;   POLYPECTOMY  06/23/2022   Procedure: POLYPECTOMY;  Surgeon: Sherrilyn Rist, MD;  Location: [facility] ENDOSCOPY;  Service: Gastroenterology;;   STUMP REVISION Left 06/27/2021   Procedure: REVISION LEFT BELOW KNEE AMPUTATION;  Surgeon: Nadara Mustard, MD;  Location: [facility] OR;  Service: Orthopedics;  Laterality: Left;   SUBMUCOSAL LIFTING INJECTION  06/22/2022   Procedure: SUBMUCOSAL LIFTING INJECTION;  Surgeon: Meridee Score Netty Starring., MD;  Location: [facility] ENDOSCOPY;  Service: Gastroenterology;;   SUBMUCOSAL  TATTOO INJECTION  06/22/2022   Procedure: SUBMUCOSAL TATTOO INJECTION;  Surgeon: Lemar Lofty., MD;  Location: [facility] ENDOSCOPY;  Service: Gastroenterology;;   TRANSURETHRAL RESECTION OF PROSTATE N/A 06/28/2020   Procedure: TRANSURETHRAL RESECTION OF THE PROSTATE (TURP);  Surgeon: Marcine Matar, MD;  Location: [facility] OR;  Service: Urology;  Laterality: N/A;   WISDOM TOOTH EXTRACTION     Social History   Occupational History   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  Tobacco Use   Smoking status: Former    Types: Cigars    Start date: 04/08/1973    Quit date: 12/24/1988    Years since quitting: 33.6   Smokeless tobacco: Never   Tobacco comments:    Cigars and Pipe   Vaping Use   Vaping Use: Never used  Substance and Sexual Activity   Alcohol use: Never   Drug use: Never   Sexual activity: Yes    Partners: Female

## 2022-08-30 ENCOUNTER — Encounter: Payer: Self-pay | Admitting: Orthopedic Surgery

## 2022-08-30 NOTE — Progress Notes (Signed)
Office Visit Note   Patient: [name]           Date of Birth: 1951-03-10           MRN: [id number] Visit Date: 08/20/2022              Requested by: Adolph Pollack, FNP [id number] [place],  [place] [address] PCP: Adolph Pollack, FNP  Chief Complaint  Patient presents with   Right Leg - Routine Post Op    07/26/2022 right BKA       HPI: Patient is a 72 year old gentleman who presents 3 weeks status post right below-knee amputation.  He is wearing a shrinker and limb protector he feels well feels much better than his surgery on the left lower extremity.  Assessment & Plan: Visit Diagnoses:  1. Below-knee amputation of left lower extremity, initial encounter (HCC)   2. Below-knee amputation of right lower extremity, initial encounter (HCC)     Plan: Staples harvested recommended going to the gym to start strengthening exercises.  Recommend that he advance to a 2 XL shrinker.  Follow-Up Instructions: Return in about 4 weeks (around 09/17/2022).   Ortho Exam  Patient is alert, oriented, no adenopathy, well-dressed, normal affect, normal respiratory effort. Examination the incision is well-approximated there is no swelling no cellulitis no drainage  Imaging: No results found. No images are attached to the encounter.  Labs: Lab Results  Component Value Date   HGBA1C 5.0 05/17/2022   HGBA1C 5.5 05/04/2021   HGBA1C 6.3 (H) 02/02/2021   ESRSEDRATE 34 (H) 01/24/2020   ESRSEDRATE 41 (H) 01/10/2020   ESRSEDRATE 55 (H) 12/27/2019   CRP 8.8 (H) 01/24/2020   CRP 35.9 (H) 01/10/2020   CRP 77.9 (H) 12/27/2019   REPTSTATUS 07/27/2022 FINAL 07/22/2022   GRAMSTAIN  05/17/2022    ABUNDANT WBC PRESENT, PREDOMINANTLY PMN ABUNDANT GRAM POSITIVE COCCI ABUNDANT GRAM NEGATIVE RODS    CULT  07/22/2022    NO GROWTH 5 DAYS Performed at [facility], [address] [place], [place] [address]    LABORGA PROTEUS MIRABILIS 05/17/2022   LABORGA  ENTEROCOCCUS FAECALIS 05/17/2022   LABORGA STAPHYLOCOCCUS AUREUS 05/17/2022     Lab Results  Component Value Date   ALBUMIN 3.4 (L) 08/11/2022   ALBUMIN 3.2 (L) 08/10/2022   ALBUMIN 3.1 (L) 08/08/2022   PREALBUMIN 32.7 05/09/2021   PREALBUMIN 14.8 (L) 12/14/2018    Lab Results  Component Value Date   MG 2.0 07/25/2022   MG 1.6 (L) 07/23/2022   MG 2.4 05/14/2021   Lab Results  Component Value Date   VD25OH 42.0 11/19/2021   VD25OH 16.13 (L) 05/09/2021    Lab Results  Component Value Date   PREALBUMIN 32.7 05/09/2021   PREALBUMIN 14.8 (L) 12/14/2018      Latest Ref Rng & Units 08/10/2022    1:00 PM 08/08/2022    3:46 PM 08/06/2022   12:00 PM  CBC EXTENDED  WBC 4.0 - 10.5 K/uL 6.7  6.7  7.5   RBC 4.22 - 5.81 MIL/uL 3.11  2.99  3.03   Hemoglobin 13.0 - 17.0 g/dL 9.1  8.8  9.1   HCT 56.2 - 52.0 % 29.5  28.3  27.4   Platelets 150 - 400 K/uL 127  130  133      There is no height or weight on file to calculate BMI.  Orders:  No orders of the defined types were placed in this encounter.  No orders of the defined types were placed in this encounter.    Procedures: No procedures performed  Clinical Data: No additional findings.  ROS:  All other systems negative, except as noted in the HPI. Review of Systems  Objective: Vital Signs: There were no vitals taken for this visit.  Specialty Comments:  No specialty comments available.  PMFS History: Patient Active Problem List   Diagnosis Date Noted   Right below-knee amputee (HCC) 07/29/2022   Severe sepsis (HCC) 07/23/2022   Foot infection 07/22/2022   Acute respiratory failure (HCC) 07/22/2022   Anemia due to chronic blood loss 06/23/2022   Heme positive stool 06/23/2022   Benign neoplasm of cecum 06/23/2022   Benign neoplasm of transverse colon 06/23/2022   Benign neoplasm of descending colon 06/23/2022   Abscess of right foot 05/17/2022   Contraindication to anticoagulation therapy GU Bleed 03/13/2022    Paroxysmal atrial fibrillation (HCC) 08/18/2021   Anemia of chronic renal failure 08/18/2021   Thrombocytopenia (HCC) 08/18/2021   Acute metabolic encephalopathy 08/18/2021   Action induced myoclonus 08/18/2021   Left below-knee amputee (HCC) 05/11/2021   Symptomatic anemia 09/13/2020   COVID-19 virus infection 09/13/2020   Pressure injury of skin 06/29/2020   Gross hematuria 06/16/2020   Typical atrial flutter (HCC) 03/24/2020   Bilateral pleural effusion 02/24/2020   Healthcare maintenance 01/28/2020   Shortness of breath 01/28/2020   History of partial ray amputation of fourth toe of right foot (HCC) 09/08/2019   Chronic cough 06/25/2019   Cutaneous abscess of right foot    Subacute osteomyelitis, right ankle and foot (HCC) 12/14/2018   AKI (acute kidney injury) (HCC) 12/14/2018   ESRD on dialysis (HCC) 12/14/2018   S/P CABG (coronary artery bypass graft) 12/11/2018   Gait abnormality 08/24/2018   Diabetic neuropathy (HCC) 02/06/2018   Cervical myelopathy (HCC) 02/06/2018   Onychomycosis 10/30/2017   Other spondylosis with radiculopathy, cervical region 01/27/2017   Midfoot ulcer, right, limited to breakdown of skin (HCC) 11/15/2016   Lateral epicondylitis, left elbow 08/12/2016   Prostate cancer (HCC) 06/19/2016   Cellulitis of right foot 12/24/2015   Gout 09/14/2012   Hypertension associated with diabetes (HCC) 09/14/2012   Hypothyroidism 09/14/2012   CAD (coronary artery disease) 09/14/2012   Insulin dependent type 2 diabetes mellitus (HCC) 09/14/2012   Hyperlipidemia associated with type 2 diabetes mellitus (HCC)    Neuropathy (HCC)    Past Medical History:  Diagnosis Date   Acute osteomyelitis of metatarsal bone of left foot (HCC)    Ambulates with cane    straight cane   Anemia    Cervical myelopathy (HCC) 02/06/2018   Chronic kidney disease    dailysis [facility]- home   Community acquired pneumonia of left lower lobe of lung 08/18/2021   Complication of  anesthesia    Coronary artery disease    Dehiscence of amputation stump of left lower extremity (HCC)    Diabetes mellitus without complication (HCC)    type2   Diabetic foot ulcer (HCC)    Diabetic neuropathy (HCC) 02/06/2018   Dysrhythmia    Gait abnormality 08/24/2018   GERD (gastroesophageal reflux disease)     01/06/20- not current   Gout    History of blood transfusion    History of blood transfusion    History of kidney stones    passed stones   Hypercholesteremia    Hypertension    Hypothyroidism    Neuromuscular disorder (HCC)    neuropathy left leg and  bilateral feet   Neuropathy    Partial nontraumatic amputation of foot, 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   PONV (postoperative nausea and vomiting)    Prostate cancer (HCC)    PVD (peripheral vascular disease) (HCC)    with amputations    Family History  Problem Relation Age of Onset   Diabetes Mellitus II Mother    Kidney disease Mother    Diabetes Mellitus II Father    CAD Father    Cancer Father        prostate   Kidney disease Father    Diabetes Mellitus II Brother    Kidney disease Brother    Diabetes Mellitus II Brother    Stomach cancer Brother 70   Kidney disease Brother    Colon cancer Neg Hx    Colon polyps Neg Hx    Esophageal cancer Neg Hx    Rectal cancer Neg Hx    Pancreatic cancer Neg Hx     Past Surgical History:  Procedure Laterality Date   A-FLUTTER ABLATION N/A 04/06/2020   Procedure: A-FLUTTER ABLATION;  Surgeon: Marinus Maw, MD;  Location: [facility];  Service: Cardiovascular;  Laterality: N/A;   AMPUTATION Left 12/25/2013   Procedure: AMPUTATION RAY LEFT 5TH RAY;  Surgeon: Nadara Mustard, MD;  Location: [facility] ORS;  Service: Orthopedics;  Laterality: Left;   AMPUTATION Right 12/15/2018   Procedure: AMPUTATION OF 4TH AND 5TH TOES RIGHT FOOT;  Surgeon: Nadara Mustard, MD;  Location: [facility] OR;  Service: Orthopedics;  Laterality: Right;   AMPUTATION Left 02/02/2021   Procedure: LEFT FOOT 4TH RAY  AMPUTATION;  Surgeon: Nadara Mustard, MD;  Location: [facility] OR;  Service: Orthopedics;  Laterality: Left;   AMPUTATION Left 05/09/2021   Procedure: LEFT BELOW KNEE AMPUTATION;  Surgeon: Nadara Mustard, MD;  Location: [facility] OR;  Service: Orthopedics;  Laterality: Left;   AMPUTATION Right 07/26/2022   Procedure: RIGHT BELOW KNEE AMPUTATION;  Surgeon: Nadara Mustard, MD;  Location: [facility] OR;  Service: Orthopedics;  Laterality: Right;   APPLICATION OF WOUND VAC Right 12/15/2018   Procedure: APPLICATION OF WOUND VAC;  Surgeon: Nadara Mustard, MD;  Location: [facility] OR;  Service: Orthopedics;  Laterality: Right;   AV FISTULA PLACEMENT Left 03/25/2019   Procedure: LEFT ARM ARTERIOVENOUS (AV) FISTULA CREATION;  Surgeon: Nada Libman, MD;  Location: [facility] OR;  Service: Vascular;  Laterality: Left;   BACK SURGERY     BASCILIC VEIN TRANSPOSITION Left 05/20/2019   Procedure: SECOND STAGE LEFT BASCILIC VEIN TRANSPOSITION;  Surgeon: Nada Libman, MD;  Location: [facility] OR;  Service: Vascular;  Laterality: Left;   BIOPSY  06/22/2022   Procedure: BIOPSY;  Surgeon: Lemar Lofty., MD;  Location: [facility] ENDOSCOPY;  Service: Gastroenterology;;   CARDIAC CATHETERIZATION  02/17/2014   CHOLECYSTECTOMY     COLONOSCOPY  2011   in [place], Normal   COLONOSCOPY N/A 06/23/2022   Procedure: COLONOSCOPY;  Surgeon: Sherrilyn Rist, MD;  Location: [facility]OSCOPY;  Service: Gastroenterology;  Laterality: N/A;   CORONARY ARTERY BYPASS GRAFT  2008   CYSTOSCOPY N/A 06/29/2020   Procedure: CYSTOSCOPY WITH CLOT EVACUATION AND  FULGERATION;  Surgeon: Marcine Matar, MD;  Location: [facility] OR;  Service: Urology;  Laterality: N/A;   CYSTOSCOPY WITH FULGERATION Bilateral 06/17/2020   Procedure: CYSTOSCOPY,BILATERAL RETROGRADE, CLOT EVACUATION WITH FULGERATION OF THE BLADDER;  Surgeon: Jannifer Hick, MD;  Location: [facility] OR;  Service: Urology;  Laterality: Bilateral;   CYSTOSCOPY WITH FULGERATION N/A 06/28/2020  Procedure: CYSTOSCOPY WITH CLOT  EVACUATION AND FULGERATION OF BLEEDERS;  Surgeon: Marcine Matar, MD;  Location: [facility] OR;  Service: Urology;  Laterality: N/A;  1 HR   ENTEROSCOPY N/A 06/22/2022   Procedure: ENTEROSCOPY;  Surgeon: Meridee Score Netty Starring., MD;  Location: [facility] ENDOSCOPY;  Service: Gastroenterology;  Laterality: N/A;   HEMOSTASIS CLIP PLACEMENT  06/22/2022   Procedure: HEMOSTASIS CLIP PLACEMENT;  Surgeon: Lemar Lofty., MD;  Location: [facility] ENDOSCOPY;  Service: Gastroenterology;;   HOT HEMOSTASIS N/A 06/22/2022   Procedure: HOT HEMOSTASIS (ARGON PLASMA COAGULATION/BICAP);  Surgeon: Lemar Lofty., MD;  Location: [facility] ENDOSCOPY;  Service: Gastroenterology;  Laterality: N/A;   I & D EXTREMITY Right 12/15/2018   Procedure: DEBRIDEMENT RIGHT FOOT;  Surgeon: Nadara Mustard, MD;  Location: [facility] OR;  Service: Orthopedics;  Laterality: Right;   I & D EXTREMITY Right 08/27/2019   Procedure: PARTIAL CUBOID EXCISION RIGHT FOOT;  Surgeon: Nadara Mustard, MD;  Location: [facility] OR;  Service: Orthopedics;  Laterality: Right;   I & D EXTREMITY Right 01/07/2020   Procedure: RIGHT FOOT EXCISION INFECTED BONE;  Surgeon: Nadara Mustard, MD;  Location: [facility] OR;  Service: Orthopedics;  Laterality: Right;   I & D EXTREMITY Right 05/17/2022   Procedure: IRRIGATION AND DEBRIDEMENT RIGHT FOOT ABSCESS;  Surgeon: Nadara Mustard, MD;  Location: [facility] OR;  Service: Orthopedics;  Laterality: Right;   NECK SURGERY     novemver 2019   POLYPECTOMY  06/22/2022   Procedure: POLYPECTOMY;  Surgeon: Mansouraty, Netty Starring., MD;  Location: [facility] ENDOSCOPY;  Service: Gastroenterology;;   POLYPECTOMY  06/23/2022   Procedure: POLYPECTOMY;  Surgeon: Sherrilyn Rist, MD;  Location: [facility] ENDOSCOPY;  Service: Gastroenterology;;   STUMP REVISION Left 06/27/2021   Procedure: REVISION LEFT BELOW KNEE AMPUTATION;  Surgeon: Nadara Mustard, MD;  Location: [facility] OR;  Service: Orthopedics;  Laterality: Left;   SUBMUCOSAL LIFTING INJECTION  06/22/2022   Procedure: SUBMUCOSAL  LIFTING INJECTION;  Surgeon: Meridee Score Netty Starring., MD;  Location: [facility] ENDOSCOPY;  Service: Gastroenterology;;   SUBMUCOSAL TATTOO INJECTION  06/22/2022   Procedure: SUBMUCOSAL TATTOO INJECTION;  Surgeon: Lemar Lofty., MD;  Location: [facility] ENDOSCOPY;  Service: Gastroenterology;;   TRANSURETHRAL RESECTION OF PROSTATE N/A 06/28/2020   Procedure: TRANSURETHRAL RESECTION OF THE PROSTATE (TURP);  Surgeon: Marcine Matar, MD;  Location: [facility] OR;  Service: Urology;  Laterality: N/A;   WISDOM TOOTH EXTRACTION     Social History   Occupational History   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  Tobacco Use   Smoking status: Former    Types: Cigars    Start date: 04/08/1973    Quit date: 12/24/1988    Years since quitting: 33.7   Smokeless tobacco: Never   Tobacco comments:    Cigars and Pipe   Vaping Use   Vaping Use: Never used  Substance and Sexual Activity   Alcohol use: Never   Drug use: Never   Sexual activity: Yes    Partners: Female

## 2022-09-03 ENCOUNTER — Telehealth: Payer: Self-pay | Admitting: Nurse Practitioner

## 2022-09-03 NOTE — Telephone Encounter (Signed)
Inbound call from patient, is experiencing bloating and abdominal pain. Patient wants appointment ASAP. Patient is adamant about being seen sooner, only is willing to see Colleen. Patient is wanting a call back to be seen within a week, states he cannot wait three months for an appointment.

## 2022-09-04 NOTE — Telephone Encounter (Signed)
Left message for pt to call back  °

## 2022-09-04 NOTE — Telephone Encounter (Signed)
Pt stated that he feels bloated all the time now for several weeks. Taking gas x with little relief. Pt states that it seems like immediately after he eats he also has to have a BM. BM's are regular. No diarrhea.  Pt was scheduled to see Alcide Evener NP on 11-21-2022 at 9:00. Pt made aware.  Pt is requesting if something can be prescribed prior to the office visit. Pt was frustrated at the wait time for the appointment. Please advise.

## 2022-09-04 NOTE — Telephone Encounter (Signed)
[name], it looks like I have Monday June 3rd 9am or 10am appts available. Pls double check this and if slot open schedule patient for office visit.

## 2022-09-04 NOTE — Telephone Encounter (Signed)
Pt was rescheduled for 09/09/2022 at 10:00 AM. Pt was left a detailed voice message with the time and date of the new appointment. Pt verbalized understanding with all questions answered.

## 2022-09-04 NOTE — Telephone Encounter (Signed)
Patient returning your call. He said he will take June 3 at 10 am..

## 2022-09-08 NOTE — Progress Notes (Unsigned)
09/08/2022 DELMORE DION [id number] 11/07/1950   Chief Complaint: Abdominal bloat  History of Present Illness: [name] is a 72 year old male history of hypertension, coronary artery disease s/p 4 vessel CABG 02/2002, atrial flutter s/p ablation 04/06/2020, diabetes mellitus type 2, ESRD on hemodialysis Sun-Wed-Fri at home since 05/2019, anemia, peripheral neuropathy s/p partial amputation of the right foot and amputation of his left 5th toe, s/p left BKA 05/09/2021 with revision 06/2021, chronic cough, kidney stones, hypothyroidism, prostate cancer s/p radiation 2019. Past cholecytectomy 1988.    He was admitted to the hospital 07/22/2022 with altered mental status x 2 days low-grade fever and hypoxia as well as chronic right foot wound with sepsis. He was briefly intubated then extubated the following day.  Treated with IV antibiotics. Ortho was consulted in regards to abscess of right foot with x-ray showing subcutaneous gas within the soft tissue lateral and plantar to the fifth tarsometatarsal joint without osteomyelitis. His right limb was not felt to be salvageable and underwent right BKA 07/26/2022 per Dr. Lajoyce Corners.  He was discharged to rehab 07/29/2022 and was subsequently discharged home 08/11/2022.  He presents to our office today with complaints of significant abdominal bloat and passing soft to loose bowel movements after eating for the past 2 weeks.  He denies having any diarrhea.  He infrequently sees a small amount of bright red blood on the toilet tissue.  He has intermittent mild nausea without vomiting.  No GERD symptoms.  His dialysis schedule varies from week to week, last dialysis session was yesterday.  CTAP done during his 07/22/2022 hospitalization as noted above  identified mild nodular hepatic contour suggestive of cirrhosis with a small amount of ascites and no evidence of splenomegaly.  He denies any known history of cirrhosis.  No alcohol use.     Latest Ref Rng & Units 08/10/2022     1:00 PM 08/08/2022    3:46 PM 08/06/2022   12:00 PM  CBC  WBC 4.0 - 10.5 K/uL 6.7  6.7  7.5   Hemoglobin 13.0 - 17.0 g/dL 9.1  8.8  9.1   Hematocrit 39.0 - 52.0 % 29.5  28.3  27.4   Platelets 150 - 400 K/uL 127  130  133        Latest Ref Rng & Units 08/11/2022    7:47 AM 08/10/2022    1:00 PM 08/08/2022    3:46 PM  CMP  Glucose 70 - 99 mg/dL 409  811  914   BUN 8 - 23 mg/dL 27  48  64   Creatinine 0.61 - 1.24 mg/dL 7.82  9.56  2.13   Sodium 135 - 145 mmol/L 136  136  135   Potassium 3.5 - 5.1 mmol/L 5.0  6.8  5.6   Chloride 98 - 111 mmol/L 91  96  99   CO2 22 - 32 mmol/L 30  27  25    Calcium 8.9 - 10.3 mg/dL 9.0  8.7  8.2     CTAP with contrast 07/22/2022:  FINDINGS: CT CHEST FINDINGS   Cardiovascular: The heart is normal in size. No pericardial effusion.   No evidence of thoracic aortic aneurysm. Atherosclerotic calcifications of the aortic arch.   Severe three-vessel coronary atherosclerosis. Postsurgical changes related to prior CABG.   Mediastinum/Nodes: No suspicious mediastinal lymphadenopathy.   Visualized thyroid is unremarkable.   Lungs/Pleura: Evaluation of the lung parenchyma is constrained by respiratory motion. Within that constraint, there are no suspicious pulmonary nodules.  Very mild dependent bilateral lower lobe atelectasis.   No focal consolidation.   No pleural effusion or pneumothorax.   Musculoskeletal: Degenerative changes of the mid/lower thoracic spine. Cervical spine fixation hardware, incompletely visualized. Median sternotomy.   Stable sclerotic lesion along the left T10 vertebral body (sagittal image 87), unchanged since 2022. While indeterminate in this clinical setting, stability favors a benign bone island.   CT ABDOMEN PELVIS FINDINGS   Hepatobiliary: Mildly nodular hepatic contour.   Status post cholecystectomy. No intrahepatic or extrahepatic duct dilatation.   Pancreas: Within normal limits.   Spleen: Within normal  limits.   Adrenals/Urinary Tract: Stable 3.9 cm right adrenal mass with macroscopic fat, compatible with a benign myelolipoma. Nodular thickening of the left adrenal gland without discrete mass.   Bilateral renal cysts, including 3.7 cm posterior right upper pole renal cyst (series 3/image 92). No hydronephrosis.   Bladder is thick-walled although underdistended.   Stomach/Bowel: Stomach is within normal limits.   No evidence of bowel obstruction.   Normal appendix (series 3/image 100).   Sigmoid diverticulosis, without evidence of diverticulitis.   Vascular/Lymphatic: No evidence of abdominal aortic aneurysm.   Atherosclerotic calcifications of the abdominal aorta and branch vessels.   No suspicious abdominopelvic lymphadenopathy.   Reproductive: Prostatomegaly, with enlargement of the central gland indenting the base of the bladder, suggesting BPH. Additional fiducial markers.   Other: Trace perihepatic and perisplenic ascites. Small volume pelvic ascites. This appearance is similar to the prior.   Musculoskeletal: Degenerative changes of the lumbar spine.   IMPRESSION: No acute cardiopulmonary abnormality.   Mildly nodular hepatic contour, correlate for cirrhosis. Small volume abdominopelvic ascites, chronic.   Fiducial markers in the prostate. No findings specific for metastatic disease.   Additional ancillary findings as above.  ECHO 03/17/2020: 1. Mildly depressed LV systolic function with visual EF 40-45%. Left ventricle cavity is normal in size. Moderate concentric hypertrophy of the left ventricle. Left ventricle regional wall motion findings: Mid inferoseptal, Apical septal and Apical cap hypokinesis. Unable to evaluate diastolic function due to atrial fibrillation. Elevated LAP. 2. Left atrial cavity is severely dilated. 3. Aortic valve sclerosis without stenosis. 4. Mild (Grade I) mitral regurgitation. 5. Mild tricuspid regurgitation. No evidence of  pulmonary hypertension. RVSP measures 33 mmHg. 6. Mild pulmonic regurgitation. 7. IVC is dilated with blunted respiratory response. 8. Compared to prior study dated 06/08/2013: LVEF was 60-65% and now 40-45%, RWMA is new, LA is now severely dilated.  Current Outpatient Medications on File Prior to Visit  Medication Sig Dispense Refill   acetaminophen (TYLENOL) 325 MG tablet Take 2 tablets (650 mg total) by mouth every 4 (four) hours as needed for fever or mild pain.     allopurinol (ZYLOPRIM) 100 MG tablet Take 2 tablets (200 mg total) by mouth daily. 30 tablet 0   ascorbic acid (VITAMIN C) 1000 MG tablet Take 1 tablet (1,000 mg total) by mouth daily. 30 tablet 0   carvedilol (COREG) 12.5 MG tablet Take 1 tablet (12.5 mg total) by mouth 2 (two) times daily with a meal. 60 tablet 0   Darbepoetin Alfa (ARANESP) 100 MCG/0.5ML SOSY injection Inject 0.5 mLs (100 mcg total) into the vein every Thursday with hemodialysis. 4.2 mL    IRON SUCROSE IV Iron Sucrose (Venofer) Self Administer at Home     levothyroxine (SYNTHROID) 112 MCG tablet Take 1 tablet (112 mcg total) by mouth daily before breakfast. 30 tablet 0   losartan (COZAAR) 50 MG tablet Take 50 mg by mouth  2 (two) times daily.     Methoxy PEG-Epoetin Beta (MIRCERA IJ) Inject into the skin.     ondansetron (ZOFRAN) 4 MG tablet Take 4 mg by mouth every 6 (six) hours.     OZEMPIC, 0.25 OR 0.5 MG/DOSE, 2 MG/3ML SOPN Inject 0.25 mg into the skin once a week.     pantoprazole (PROTONIX) 40 MG tablet Take 1 tablet (40 mg total) by mouth daily. (Patient taking differently: Take 40 mg by mouth 2 (two) times daily before a meal.) 30 tablet 0   pregabalin (LYRICA) 25 MG capsule 1 tablet 3 times daily and 2 tablets nightly 120 capsule 0   rosuvastatin (CRESTOR) 10 MG tablet Take 1 tablet (10 mg total) by mouth daily. 30 tablet 0   tiZANidine (ZANAFLEX) 2 MG tablet Take 1 tablet (2 mg total) by mouth 2 (two) times daily as needed for muscle spasms. 30  tablet 0   Vitamin D, Ergocalciferol, (DRISDOL) 1.25 MG (50000 UNIT) CAPS capsule Take 1 capsule (50,000 Units total) by mouth every 7 (seven) days. 20 capsule 3   zinc sulfate 220 (50 Zn) MG capsule Take 1 capsule (220 mg total) by mouth daily. 30 capsule 0   modafinil (PROVIGIL) 100 MG tablet Take 1 tablet (100 mg total) by mouth daily. 90 tablet 3   oxyCODONE (OXY IR/ROXICODONE) 5 MG immediate release tablet Take 1 tablet (5 mg total) by mouth every 4 (four) hours as needed for moderate pain. (Patient not taking: Reported on 09/09/2022) 30 tablet 0   No current facility-administered medications on file prior to visit.   Allergies  Allergen Reactions   Morphine     Other reaction(s): Delusions (intolerance)   Mushroom Extract Complex Nausea Only   Current Medications, Allergies, Past Medical History, Past Surgical History, Family History and Social History were reviewed in [name] record.  Review of Systems:   Constitutional: Negative for fever, sweats, chills or weight loss.  Respiratory: Negative for shortness of breath.   Cardiovascular: Negative for chest pain or palpitations. Gastrointestinal: See HPI.  GU: Patient makes small amount of urine, endorses having mild burning with urination. Musculoskeletal: Negative for back pain or muscle aches.  Neurological: Negative for dizziness, headaches or paresthesias.   Physical Exam: BP (!) 98/50 (BP Location: Right Arm, Patient Position: Sitting, Cuff Size: Normal)   Pulse (!) 56  Weight not obtainable, patient in wheelchair, s/p R BKA, left prosthesis in use.  General: Chronically ill-appearing 72 year old male in no acute distress. Head: Normocephalic and atraumatic. Eyes: No scleral icterus. Conjunctiva pink . Ears: Normal auditory acuity. Mouth: Dentition intact. No ulcers or lesions.  Lungs: Clear throughout to auscultation. Heart: Regular rate and rhythm, no murmur. Abdomen: Soft, No masses or  hepatomegaly. No obvious ascites, abdomen is not tense. Normal bowel sounds x 4 quadrants.  Rectal: Deferred. Musculoskeletal: Right BKA. Left BKA with prosthesis in use.  Extremities: No edema. Neurological: Alert oriented x 4. No focal deficits. Hands tremulous with asterixis.  Psychological: Alert and cooperative. Normal mood and affect  Assessment and Recommendations:  72 year old male with a complex past medical history with abdominal bloat with soft loose bowel movements which occurs shortly after eating x 2 weeks.  Patient's symptoms are likely due to antibiotic use during his 07/2022 hospitalization secondary to right foot ulcer with sepsis s/p right BKA.  -[employer] C. difficile and stool culture -SIBO breath test, will prescribe a course of Xifaxan if SIBO test positive -IBgard 1 p.o. twice daily -Gas-X  1 tab p.o. twice daily -Lactaid 1 tab with each dairy product   Questional cirrhosis per CTAP 07/22/2022.  Hep B surface antigen - 07/30/2022. -CBC, CMET, PT/INR, AFP, hepatitis A total antibody.  Hepatitis B core total antibody, hepatitis C antibody, IgG, ANA, SMA, iron and ferritin. -Follow-up in 2 to 3 months   Thrombocytopenia, possibly due to cirrhosis.  CTAP 07/22/2022 showed a normal spleen.   Chronic anemia secondary to ESRD on HD.  Dialyzed yesterday.   Infrequent bright red blood in the toilet tissue. Colonoscopy in 2011 reported as normal by the patient.  Patient previously declined any further screening colonoscopies.  Hypotensive, asymptomatic.  BP 98/50.  Heart 56. -Patient instructed to contact his PCP and cardiologist for further evaluation and management

## 2022-09-09 ENCOUNTER — Telehealth: Payer: Medicare Other

## 2022-09-09 ENCOUNTER — Encounter: Payer: Self-pay | Admitting: Nurse Practitioner

## 2022-09-09 ENCOUNTER — Encounter
Payer: Medicare Other | Attending: Physical Medicine and Rehabilitation | Admitting: Physical Medicine and Rehabilitation

## 2022-09-09 ENCOUNTER — Other Ambulatory Visit (INDEPENDENT_AMBULATORY_CARE_PROVIDER_SITE_OTHER): Payer: Medicare Other

## 2022-09-09 ENCOUNTER — Telehealth: Payer: Self-pay

## 2022-09-09 ENCOUNTER — Ambulatory Visit (INDEPENDENT_AMBULATORY_CARE_PROVIDER_SITE_OTHER): Payer: Medicare Other | Admitting: Nurse Practitioner

## 2022-09-09 VITALS — BP 124/68 | HR 97

## 2022-09-09 VITALS — BP 98/50 | HR 56

## 2022-09-09 DIAGNOSIS — Z89512 Acquired absence of left leg below knee: Secondary | ICD-10-CM | POA: Diagnosis present

## 2022-09-09 DIAGNOSIS — G4701 Insomnia due to medical condition: Secondary | ICD-10-CM

## 2022-09-09 DIAGNOSIS — R4189 Other symptoms and signs involving cognitive functions and awareness: Secondary | ICD-10-CM | POA: Diagnosis present

## 2022-09-09 DIAGNOSIS — D649 Anemia, unspecified: Secondary | ICD-10-CM

## 2022-09-09 DIAGNOSIS — K7460 Unspecified cirrhosis of liver: Secondary | ICD-10-CM

## 2022-09-09 DIAGNOSIS — R197 Diarrhea, unspecified: Secondary | ICD-10-CM | POA: Diagnosis not present

## 2022-09-09 DIAGNOSIS — R140 Abdominal distension (gaseous): Secondary | ICD-10-CM

## 2022-09-09 DIAGNOSIS — I480 Paroxysmal atrial fibrillation: Secondary | ICD-10-CM | POA: Diagnosis present

## 2022-09-09 DIAGNOSIS — R400 Somnolence: Secondary | ICD-10-CM | POA: Insufficient documentation

## 2022-09-09 DIAGNOSIS — K529 Noninfective gastroenteritis and colitis, unspecified: Secondary | ICD-10-CM

## 2022-09-09 DIAGNOSIS — R413 Other amnesia: Secondary | ICD-10-CM | POA: Diagnosis present

## 2022-09-09 LAB — CBC WITH DIFFERENTIAL/PLATELET
Basophils Absolute: 0.1 10*3/uL (ref 0.0–0.1)
Basophils Relative: 2 % (ref 0.0–3.0)
Eosinophils Absolute: 0.1 10*3/uL (ref 0.0–0.7)
Eosinophils Relative: 2.2 % (ref 0.0–5.0)
HCT: 28 % — ABNORMAL LOW (ref 39.0–52.0)
Hemoglobin: 9.3 g/dL — ABNORMAL LOW (ref 13.0–17.0)
Lymphocytes Relative: 5.8 % — ABNORMAL LOW (ref 12.0–46.0)
Lymphs Abs: 0.3 10*3/uL — ABNORMAL LOW (ref 0.7–4.0)
MCHC: 33.3 g/dL (ref 30.0–36.0)
MCV: 90.1 fl (ref 78.0–100.0)
Monocytes Absolute: 0.5 10*3/uL (ref 0.1–1.0)
Monocytes Relative: 9.7 % (ref 3.0–12.0)
Neutro Abs: 3.7 10*3/uL (ref 1.4–7.7)
Neutrophils Relative %: 80.3 % — ABNORMAL HIGH (ref 43.0–77.0)
Platelets: 78 10*3/uL — ABNORMAL LOW (ref 150.0–400.0)
RBC: 3.1 Mil/uL — ABNORMAL LOW (ref 4.22–5.81)
RDW: 18.1 % — ABNORMAL HIGH (ref 11.5–15.5)
WBC: 4.7 10*3/uL (ref 4.0–10.5)

## 2022-09-09 LAB — COMPREHENSIVE METABOLIC PANEL
ALT: 9 U/L (ref 0–53)
AST: 16 U/L (ref 0–37)
Albumin: 4.1 g/dL (ref 3.5–5.2)
Alkaline Phosphatase: 161 U/L — ABNORMAL HIGH (ref 39–117)
BUN: 52 mg/dL — ABNORMAL HIGH (ref 6–23)
CO2: 29 mEq/L (ref 19–32)
Calcium: 8.5 mg/dL (ref 8.4–10.5)
Chloride: 90 mEq/L — ABNORMAL LOW (ref 96–112)
Creatinine, Ser: 6.82 mg/dL (ref 0.40–1.50)
GFR: 7.48 mL/min — CL (ref >60.00)
Glucose, Bld: 93 mg/dL (ref 70–99)
Potassium: 5.6 mEq/L — ABNORMAL HIGH (ref 3.5–5.1)
Sodium: 131 mEq/L — ABNORMAL LOW (ref 135–145)
Total Bilirubin: 1.2 mg/dL (ref 0.2–1.2)
Total Protein: 6.4 g/dL (ref 6.0–8.3)

## 2022-09-09 LAB — IBC + FERRITIN
Ferritin: 945.1 ng/mL — ABNORMAL HIGH (ref 22.0–322.0)
Iron: 51 ug/dL (ref 42–165)
Saturation Ratios: 18.4 % — ABNORMAL LOW (ref 20.0–50.0)
TIBC: 277.2 ug/dL (ref 250.0–450.0)
Transferrin: 198 mg/dL — ABNORMAL LOW (ref 212.0–360.0)

## 2022-09-09 LAB — PROTIME-INR
INR: 1.6 ratio — ABNORMAL HIGH (ref 0.8–1.0)
Prothrombin Time: 16.2 s — ABNORMAL HIGH (ref 9.6–13.1)

## 2022-09-09 MED ORDER — MODAFINIL 100 MG PO TABS: 100.0000 mg | ORAL_TABLET | Freq: Every day | ORAL | 3 refills | Status: DC

## 2022-09-09 NOTE — Patient Instructions (Addendum)
[employer]  [employer] light therapy  Can we switch Coreg to Propanolol?   Tart cherry juice or pistachios

## 2022-09-09 NOTE — Telephone Encounter (Signed)
Pt called & pt verbalized understanding of lab results.

## 2022-09-09 NOTE — Progress Notes (Signed)
Subjective:    Patient ID: [name], male    DOB: October 06, 1950, 72 y.o.   MRN: [id number]  HPI [name] is a 72 year old man who presents for f/u of BKA and cognitive deficits, daytime somnolence  1) Left BKA -he is starting rehab tues and thurs at Dr. Aud[facility]  -he is working with a Psychologist, educational on mon and wed to work on Primary school teacher  -he would like to keep to keep up with his physical conditioning -he is considering soft shell with an open back that will not put as much pressure on his shin.  -cannot try the new orthotic.  -he can get it in February   2) Cognitive deficits -he is awake during the day.  -he asks about MRI results -he has been taking fish oil supplement -his wife notes that cognitive deficits have worsened  3) Neuropathy -he has pain in his right heel- it was present prior to his development of diabetes -it is burning -Qutenza did not help  4) Afib:  -not being treated for afib due to his comorbidities  5) Insomnia -intermittent Gets up at 7:30 -wakes at 2,3,4 -wants to put the leg on because then the day starts for him.  -he is taking 2 pills of lyrica at night  6) Abdominal bloating  7) Diarrhea: -discussed that he is having 5-6 stools per day.   8) Right BKA -healing well  9) Fungal infection of left BKA -discussed that wife suspects ringworm    Pain Inventory Average Pain 1 Pain Right Now 0 My pain is intermittent, constant, and stabbing  In the last 24 hours, has pain interfered with the following? General activity 5 Relation with others  0 Enjoyment of life 0 What TIME of day is your pain at its worst? morning , daytime, evening, and night Sleep (in general) Good  Pain is worse with: walking, unsure, and some activites Pain improves with: rest and not wearing the prosthesis  Relief from Meds:  not taking pain medication   Family History  Problem Relation Age of Onset   Diabetes Mellitus II Mother    Kidney disease  Mother    Diabetes Mellitus II Father    CAD Father    Cancer Father        prostate   Kidney disease Father    Diabetes Mellitus II Brother    Kidney disease Brother    Diabetes Mellitus II Brother    Stomach cancer Brother 13   Kidney disease Brother    Colon cancer Neg Hx    Colon polyps Neg Hx    Esophageal cancer Neg Hx    Rectal cancer Neg Hx    Pancreatic cancer Neg Hx    Social History   Socioeconomic History   Marital status: Married    Spouse name: Not on file   Number of children: 2   Years of education: Not on file   Highest education level: Not on file  Occupational History   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  Tobacco Use   Smoking status: Former    Types: Cigars    Start date: 04/08/1973    Quit date: 12/24/1988    Years since quitting: 33.7   Smokeless tobacco: Never   Tobacco comments:    Cigars and Pipe   Vaping Use   Vaping Use: Never used  Substance and Sexual Activity   Alcohol use: Never   Drug use: Never   Sexual activity: Yes  Partners: Female  Other Topics Concern   Not on file  Social History Narrative   Regular exercise: yes 3 times a week   Caffeine use: hot tea   Social Determinants of Health   Financial Resource Strain: Not on file  Food Insecurity: Patient Declined (07/28/2022)   Hunger Vital Sign    Worried About Running Out of Food in the Last Year: Patient declined    Ran Out of Food in the Last Year: Patient declined  Transportation Needs: No Transportation Needs (07/28/2022)   PRAPARE - Administrator, Civil Service (Medical): No    Lack of Transportation (Non-Medical): No  Physical Activity: Not on file  Stress: Not on file  Social Connections: Not on file   Past Surgical History:  Procedure Laterality Date   A-FLUTTER ABLATION N/A 04/06/2020   Procedure: A-FLUTTER ABLATION;  Surgeon: Marinus Maw, MD;  Location: [facility] INVASIVE CV LAB;  Service: Cardiovascular;  Laterality: N/A;   AMPUTATION Left  12/25/2013   Procedure: AMPUTATION RAY LEFT 5TH RAY;  Surgeon: Nadara Mustard, MD;  Location: [facility] ORS;  Service: Orthopedics;  Laterality: Left;   AMPUTATION Right 12/15/2018   Procedure: AMPUTATION OF 4TH AND 5TH TOES RIGHT FOOT;  Surgeon: Nadara Mustard, MD;  Location: [facility] OR;  Service: Orthopedics;  Laterality: Right;   AMPUTATION Left 02/02/2021   Procedure: LEFT FOOT 4TH RAY AMPUTATION;  Surgeon: Nadara Mustard, MD;  Location: [facility] OR;  Service: Orthopedics;  Laterality: Left;   AMPUTATION Left 05/09/2021   Procedure: LEFT BELOW KNEE AMPUTATION;  Surgeon: Nadara Mustard, MD;  Location: [facility] OR;  Service: Orthopedics;  Laterality: Left;   AMPUTATION Right 07/26/2022   Procedure: RIGHT BELOW KNEE AMPUTATION;  Surgeon: Nadara Mustard, MD;  Location: [facility] OR;  Service: Orthopedics;  Laterality: Right;   APPLICATION OF WOUND VAC Right 12/15/2018   Procedure: APPLICATION OF WOUND VAC;  Surgeon: Nadara Mustard, MD;  Location: [facility] OR;  Service: Orthopedics;  Laterality: Right;   AV FISTULA PLACEMENT Left 03/25/2019   Procedure: LEFT ARM ARTERIOVENOUS (AV) FISTULA CREATION;  Surgeon: Nada Libman, MD;  Location: [facility] OR;  Service: Vascular;  Laterality: Left;   BACK SURGERY     BASCILIC VEIN TRANSPOSITION Left 05/20/2019   Procedure: SECOND STAGE LEFT BASCILIC VEIN TRANSPOSITION;  Surgeon: Nada Libman, MD;  Location: [facility] OR;  Service: Vascular;  Laterality: Left;   BIOPSY  06/22/2022   Procedure: BIOPSY;  Surgeon: Lemar Lofty., MD;  Location: [facility] ENDOSCOPY;  Service: Gastroenterology;;   CARDIAC CATHETERIZATION  02/17/2014   CHOLECYSTECTOMY     COLONOSCOPY  2011   in [place], Normal   COLONOSCOPY N/A 06/23/2022   Procedure: COLONOSCOPY;  Surgeon: Sherrilyn Rist, MD;  Location: [facility] ENDOSCOPY;  Service: Gastroenterology;  Laterality: N/A;   CORONARY ARTERY BYPASS GRAFT  2008   CYSTOSCOPY N/A 06/29/2020   Procedure: CYSTOSCOPY WITH CLOT EVACUATION AND  FULGERATION;  Surgeon: Marcine Matar,  MD;  Location: [facility] OR;  Service: Urology;  Laterality: N/A;   CYSTOSCOPY WITH FULGERATION Bilateral 06/17/2020   Procedure: CYSTOSCOPY,BILATERAL RETROGRADE, CLOT EVACUATION WITH FULGERATION OF THE BLADDER;  Surgeon: Jannifer Hick, MD;  Location: [facility] OR;  Service: Urology;  Laterality: Bilateral;   CYSTOSCOPY WITH FULGERATION N/A 06/28/2020   Procedure: CYSTOSCOPY WITH CLOT EVACUATION AND FULGERATION OF BLEEDERS;  Surgeon: Marcine Matar, MD;  Location: [facility] OR;  Service: Urology;  Laterality: N/A;  1 HR   ENTEROSCOPY N/A 06/22/2022  Procedure: ENTEROSCOPY;  Surgeon: Meridee Score Netty Starring., MD;  Location: [facility] ENDOSCOPY;  Service: Gastroenterology;  Laterality: N/A;   HEMOSTASIS CLIP PLACEMENT  06/22/2022   Procedure: HEMOSTASIS CLIP PLACEMENT;  Surgeon: Lemar Lofty., MD;  Location: [facility] ENDOSCOPY;  Service: Gastroenterology;;   HOT HEMOSTASIS N/A 06/22/2022   Procedure: HOT HEMOSTASIS (ARGON PLASMA COAGULATION/BICAP);  Surgeon: Lemar Lofty., MD;  Location: [facility] ENDOSCOPY;  Service: Gastroenterology;  Laterality: N/A;   I & D EXTREMITY Right 12/15/2018   Procedure: DEBRIDEMENT RIGHT FOOT;  Surgeon: Nadara Mustard, MD;  Location: [facility] OR;  Service: Orthopedics;  Laterality: Right;   I & D EXTREMITY Right 08/27/2019   Procedure: PARTIAL CUBOID EXCISION RIGHT FOOT;  Surgeon: Nadara Mustard, MD;  Location: [facility] OR;  Service: Orthopedics;  Laterality: Right;   I & D EXTREMITY Right 01/07/2020   Procedure: RIGHT FOOT EXCISION INFECTED BONE;  Surgeon: Nadara Mustard, MD;  Location: [facility] OR;  Service: Orthopedics;  Laterality: Right;   I & D EXTREMITY Right 05/17/2022   Procedure: IRRIGATION AND DEBRIDEMENT RIGHT FOOT ABSCESS;  Surgeon: Nadara Mustard, MD;  Location: [facility] OR;  Service: Orthopedics;  Laterality: Right;   NECK SURGERY     novemver 2019   POLYPECTOMY  06/22/2022   Procedure: POLYPECTOMY;  Surgeon: Mansouraty, Netty Starring., MD;  Location: [facility] ENDOSCOPY;  Service: Gastroenterology;;    POLYPECTOMY  06/23/2022   Procedure: POLYPECTOMY;  Surgeon: Sherrilyn Rist, MD;  Location: [facility] ENDOSCOPY;  Service: Gastroenterology;;   STUMP REVISION Left 06/27/2021   Procedure: REVISION LEFT BELOW KNEE AMPUTATION;  Surgeon: Nadara Mustard, MD;  Location: [facility] OR;  Service: Orthopedics;  Laterality: Left;   SUBMUCOSAL LIFTING INJECTION  06/22/2022   Procedure: SUBMUCOSAL LIFTING INJECTION;  Surgeon: Meridee Score Netty Starring., MD;  Location: [facility] ENDOSCOPY;  Service: Gastroenterology;;   SUBMUCOSAL TATTOO INJECTION  06/22/2022   Procedure: SUBMUCOSAL TATTOO INJECTION;  Surgeon: Lemar Lofty., MD;  Location: [facility] ENDOSCOPY;  Service: Gastroenterology;;   TRANSURETHRAL RESECTION OF PROSTATE N/A 06/28/2020   Procedure: TRANSURETHRAL RESECTION OF THE PROSTATE (TURP);  Surgeon: Marcine Matar, MD;  Location: [facility] OR;  Service: Urology;  Laterality: N/A;   WISDOM TOOTH EXTRACTION     Past Medical History:  Diagnosis Date   Acute osteomyelitis of metatarsal bone of left foot (HCC)    Ambulates with cane    straight cane   Anemia    Cervical myelopathy (HCC) 02/06/2018   Chronic kidney disease    dailysis [facility]- home   Community acquired pneumonia of left lower lobe of lung 08/18/2021   Complication of anesthesia    Coronary artery disease    Dehiscence of amputation stump of left lower extremity (HCC)    Diabetes mellitus without complication (HCC)    type2   Diabetic foot ulcer (HCC)    Diabetic neuropathy (HCC) 02/06/2018   Dysrhythmia    Gait abnormality 08/24/2018   GERD (gastroesophageal reflux disease)     01/06/20- not current   Gout    History of blood transfusion    History of blood transfusion    History of kidney stones    passed stones   Hypercholesteremia    Hypertension    Hypothyroidism    Neuromuscular disorder (HCC)    neuropathy left leg and bilateral feet   Neuropathy    Partial nontraumatic amputation of foot, 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   PONV (postoperative  nausea and vomiting)    Prostate cancer (HCC)    PVD (  peripheral vascular disease) (HCC)    with amputations   BP 124/68   Pulse 97   SpO2 95%   Opioid Risk Score:   Fall Risk Score:  `1  Depression screen PHQ 2/9     03/11/2022    1:32 PM 11/02/2021    3:29 PM 09/25/2021   11:00 AM 02/28/2020    3:07 PM 01/10/2020    4:21 PM 06/20/2016    8:18 AM  Depression screen PHQ 2/9  Decreased Interest 0 0 1 0 0 0  Down, Depressed, Hopeless 0 0 3 0 0 0  PHQ - 2 Score 0 0 4 0 0 0  Altered sleeping   3     Tired, decreased energy   3     Change in appetite   3     Feeling bad or failure about yourself    0     Trouble concentrating   3     Moving slowly or fidgety/restless   3     Suicidal thoughts   0     PHQ-9 Score   19       Review of Systems  Constitutional:  Positive for appetite change.       Loss of taste  Musculoskeletal:  Positive for gait problem.  Neurological:  Positive for tremors and numbness.       Spasms  Psychiatric/Behavioral:         Depression  All other systems reviewed and are negative.      Objective:   Physical Exam Gen: no distress, normal appearing HEENT: oral mucosa pink and moist, NCAT Cardio: Reg rate Chest: normal effort, normal rate of breathing Abd: soft, non-distended Ext: no edema Psych: pleasant, normal affect Skin: intact Neuro: Alert and oriented x3 Musculoskeletal: right foot twitches, missing digits on right foot.      Assessment & Plan:  1) L BKA -encouraged appointment with [facility] for fitting of new prosthetic  2) Cognitive deficits -discussed that recommended brand of fish oil was Rosita -continue modafinil as he has experienced great improvements with this --discussed MRI results that show mild atrophy and small vessel changes -continue modafinil   3) Fatigue/daytime somnolence -decrease modafinil to 100mg  daily -continue fish oil supplement -continue coffee -start ergocalciferol 50,000U once per week for 20  weeks.  -continue HD at night.  -encouraged lifting weights   4) Afib -continue fish oil  5) Neuropathy -will not repeat Qutenza since he had burning from this in the past.  -continue lyrica  6) R BKA -discussed his therapy -discussed goal for 2nd prosthetic -recommended offloading to prevent pressure injury -discussed that he has a nurse at home  7) L BKA ringworm: -trial antifungal   8) Insomnia: -decrease modafinil to 100mg  daily -discussed that coreg can decrease melatonin production  9) Frequent stools: -recommended coconut cult  10) Impaired cognition: -discussed that this appears improved  11) Afib: -continue coreg  12) Tremor: -discussed propanolol as an alternative to Coreg if cardiology is ok with that

## 2022-09-09 NOTE — Telephone Encounter (Signed)
Error

## 2022-09-09 NOTE — Telephone Encounter (Signed)
Contacted pt & LVM to return call regarding lab results 

## 2022-09-09 NOTE — Addendum Note (Signed)
Addended by: Horton Chin on: 09/09/2022 12:45 PM   Modules accepted: Orders

## 2022-09-09 NOTE — Progress Notes (Signed)
Noted  

## 2022-09-09 NOTE — Patient Instructions (Addendum)
Your provider has ordered "[employer]" stool testing for you. You have received a kit from our office today containing all necessary supplies to complete this test. Please carefully read the stool collection instructions provided in the kit before opening the accompanying materials. In addition, be sure to place the label from the top left corner of the laboratory request sheet onto the "puritan opti-swab" tube that is supplied in the kit. This label should include your full name and date of birth. After completing the test, you should secure the purtian tube into the specimen biohazard bag. The laboratory request information sheet (including date and time of specimen collection) should be placed into the outside pocket of the specimen biohazard bag and returned to the [place] lab with 2 days of collection.   Your provider has requested that you go to the basement level for lab work before leaving today. Press "B" on the elevator. The lab is located at the first door on the left as you exit the elevator.  You have been given a testing kit to check for small intestine bacterial overgrowth (SIBO) which is completed by a company named [facility]. Make sure to return your test in the mail using the return mailing label given to you along with the kit. The test order, your demographic and insurance information have all already been sent to the company. [facility] will collect an upfront charge of $99.74 for commercial insurance plans and $209.74 is you are paying cash. Make sure to discuss with [facility] PRIOR to having the test to see if they have gotten information from your insurance company as to how much your testing will cost out of pocket, if any. Please contact [facility] at phone number [phone] to get instructions regarding how to perform the test as our office is unable to give specific testing instructions.  IBGuard- take 1 by mouth twice daily for abdominal bloat  GasX- 1 by  mouth twice daily   Take Lactaid- 1 by mouth with each dairy product  Contact your primary care provider regarding low blood pressure.  Due to recent changes in healthcare laws, you may see the results of your imaging and laboratory studies on MyChart before your provider has had a chance to review them.  We understand that in some cases there may be results that are confusing or concerning to you. Not all laboratory results come back in the same time frame and the provider may be waiting for multiple results in order to interpret others.  Please give [place] 48 hours in order for your provider to thoroughly review all the results before contacting the office for clarification of your results.   Thank you for trusting me with your gastrointestinal care!   Alcide Evener, CRNP

## 2022-09-10 LAB — HEPATITIS C ANTIBODY: Hepatitis C Ab: NONREACTIVE

## 2022-09-10 LAB — AFP TUMOR MARKER: AFP-Tumor Marker: 1.6 ng/mL (ref <6.1)

## 2022-09-11 ENCOUNTER — Encounter: Payer: Self-pay | Admitting: Cardiology

## 2022-09-11 ENCOUNTER — Ambulatory Visit: Payer: Medicare Other | Admitting: Cardiology

## 2022-09-11 VITALS — BP 132/58 | HR 58 | Ht 71.0 in

## 2022-09-11 DIAGNOSIS — I2510 Atherosclerotic heart disease of native coronary artery without angina pectoris: Secondary | ICD-10-CM

## 2022-09-11 DIAGNOSIS — I10 Essential (primary) hypertension: Secondary | ICD-10-CM

## 2022-09-11 DIAGNOSIS — G250 Essential tremor: Secondary | ICD-10-CM

## 2022-09-11 DIAGNOSIS — Z5309 Procedure and treatment not carried out because of other contraindication: Secondary | ICD-10-CM

## 2022-09-11 DIAGNOSIS — I4821 Permanent atrial fibrillation: Secondary | ICD-10-CM

## 2022-09-11 MED ORDER — PROPRANOLOL HCL ER 80 MG PO CP24
80.0000 mg | ORAL_CAPSULE | Freq: Every day | ORAL | 2 refills | Status: DC
Start: 2022-09-11 — End: 2022-12-10

## 2022-09-11 NOTE — Progress Notes (Signed)
Primary Physician/Referring:  Adolph Pollack, FNP  Patient ID: [name], male    DOB: 1951-03-28, 72 y.o.   MRN: [id number]  Chief Complaint  Patient presents with   Coronary artery disease involving native coronary artery of   Essential hypertension   Permanent atrial fibrillation (HCC)   Contraindication to anticoagulation therapy GU Bleed   Follow-up     HPI:    [name]  is a 72 y.o.  c  Since last office visit 6 months ago, he has had 2 hospitalizations 1 with altered mental status and hypoxemic respiratory failure and sepsis secondary to right foot wound infection.  Patient underwent right below-knee amputation on 07/29/2022.  Patient is now being treated for diarrhea and C. difficile colitis, he was also found to have cirrhosis of the liver by hepatic nodular contour with no evidence of liver failure.  He now presents for 45-month office visit.  Since amputation, his health is significantly improved. He has no chest pain, no dyspnea, no PND or orthopnea.  Past Medical History:  Diagnosis Date   Acute osteomyelitis of metatarsal bone of left foot (HCC)    Ambulates with cane    straight cane   Anemia    Cervical myelopathy (HCC) 02/06/2018   Chronic kidney disease    dailysis M W F- home   Community acquired pneumonia of left lower lobe of lung 08/18/2021   Complication of anesthesia    Coronary artery disease    Dehiscence of amputation stump of left lower extremity (HCC)    Diabetes mellitus without complication (HCC)    type2   Diabetic foot ulcer (HCC)    Diabetic neuropathy (HCC) 02/06/2018   Dysrhythmia    Gait abnormality 08/24/2018   GERD (gastroesophageal reflux disease)     01/06/20- not current   Gout    History of blood transfusion    History of blood transfusion    History of kidney stones    passed stones   Hypercholesteremia    Hypertension    Hypothyroidism    Neuromuscular disorder (HCC)    neuropathy left leg and bilateral feet    Neuropathy    Partial nontraumatic amputation of foot, 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  Respiratory:  Positive for cough (chronic).    Objective  Blood pressure (!) 132/58, pulse (!) 58, height 5\' 11"  (1.803 m), SpO2 96 %.     09/11/2022    2:26 PM 09/09/2022   11:55 AM 09/09/2022   10:11 AM  Vitals with BMI  Height 5\' 11"     Systolic 132 124 98  Diastolic 58 68 50  Pulse 58 97 56     Physical Exam Constitutional:      Comments: Ill looking  Neck:     Vascular: Carotid bruit (bilateral carotid bruit) present. No JVD.  Cardiovascular:  Rate and Rhythm: Normal rate. Rhythm irregular.     Heart sounds: S1 normal and S2 normal. Murmur heard.     Early systolic murmur is present with a grade of 2/6 at the upper right sternal border.     Comments: Left arm AV fistula for dialysis patent. Bilateral BKA, stump healed well.  Pulmonary:     Effort: Pulmonary effort is normal.     Breath sounds: Normal breath sounds.  Abdominal:     General: Bowel sounds are normal.     Palpations: Abdomen is soft.  Musculoskeletal:        General: Deformity (Bilateral BKA) present.     Right lower leg: No edema.  Skin:    Capillary Refill: Capillary refill takes less than 2 seconds.    Laboratory examination:   Recent Labs    08/08/22 1546 08/10/22 1300 08/11/22 0747 09/09/22 1118  NA 135 136 136 131*  K 5.6* 6.8* 5.0 5.6 No hemolysis seen*  CL 99 96* 91* 90*  CO2 25 27 30 29   GLUCOSE 148*  150* 154* 93  BUN 64* 48* 27* 52*  CREATININE 7.71* 7.00* 5.03* 6.82*  CALCIUM 8.2* 8.7* 9.0 8.5  GFRNONAA 7* 8* 12*  --       Latest Ref Rng & Units 09/09/2022   11:18 AM 08/11/2022    7:47 AM 08/10/2022    1:00 PM  CMP  Glucose 70 - 99 mg/dL 93  161  096   BUN 6 - 23 mg/dL 52  27  48   Creatinine 0.40 - 1.50 mg/dL 0.45  4.09  8.11   Sodium 135 - 145 mEq/L 131  136  136   Potassium 3.5 - 5.1 mEq/L 5.6 No hemolysis seen  5.0  6.8   Chloride 96 - 112 mEq/L 90  91  96   CO2 19 - 32 mEq/L 29  30  27    Calcium 8.4 - 10.5 mg/dL 8.5  9.0  8.7   Total Protein 6.0 - 8.3 g/dL 6.4     Total Bilirubin 0.2 - 1.2 mg/dL 1.2     Alkaline Phos 39 - 117 U/L 161     AST 0 - 37 U/L 16     ALT 0 - 53 U/L 9         Latest Ref Rng & Units 09/09/2022   11:18 AM 08/10/2022    1:00 PM 08/08/2022    3:46 PM  CBC  WBC 4.0 - 10.5 K/uL 4.7  6.7  6.7   Hemoglobin 13.0 - 17.0 g/dL 9.3  9.1  8.8   Hematocrit 39.0 - 52.0 % 28.0  29.5  28.3   Platelets 150.0 - 400.0 K/uL 78.0  127  130    HEMOGLOBIN A1C Lab Results  Component Value Date   HGBA1C 5.0 05/17/2022   MPG 96.8 05/17/2022     Medications and allergies   Allergies  Allergen Reactions   Morphine     Other reaction(s): Delusions (intolerance)   Mushroom Extract Complex Nausea Only    Current Outpatient Medications:    acetaminophen (TYLENOL) 325 MG tablet, Take 2 tablets (650 mg total) by mouth every 4 (four) hours as needed for fever or mild pain., Disp: , Rfl:    allopurinol (ZYLOPRIM) 100 MG tablet, Take 2 tablets (200 mg total) by mouth daily., Disp: 30 tablet, Rfl: 0   levothyroxine (SYNTHROID) 112 MCG tablet, Take 1 tablet (112 mcg total) by mouth daily before breakfast., Disp: 30  tablet, Rfl: 0   losartan (COZAAR) 50 MG tablet, Take 50 mg by mouth 2 (two) times daily., Disp: , Rfl:    Methoxy PEG-Epoetin Beta (MIRCERA IJ), Inject into the skin., Disp: , Rfl:    modafinil (PROVIGIL) 100 MG tablet, Take 1 tablet (100 mg total) by mouth  daily., Disp: 90 tablet, Rfl: 3   ondansetron (ZOFRAN) 4 MG tablet, Take 4 mg by mouth every 6 (six) hours., Disp: , Rfl:    OZEMPIC, 0.25 OR 0.5 MG/DOSE, 2 MG/3ML SOPN, Inject 0.25 mg into the skin once a week., Disp: , Rfl:    pantoprazole (PROTONIX) 40 MG tablet, Take 1 tablet (40 mg total) by mouth daily. (Patient taking differently: Take 40 mg by mouth 2 (two) times daily before a meal.), Disp: 30 tablet, Rfl: 0   pregabalin (LYRICA) 150 MG capsule, Take 150 mg by mouth 3 (three) times daily., Disp: , Rfl:    propranolol ER (INDERAL LA) 80 MG 24 hr capsule, Take 1 capsule (80 mg total) by mouth daily., Disp: 30 capsule, Rfl: 2   rosuvastatin (CRESTOR) 10 MG tablet, Take 1 tablet (10 mg total) by mouth daily., Disp: 30 tablet, Rfl: 0   tiZANidine (ZANAFLEX) 2 MG tablet, Take 1 tablet (2 mg total) by mouth 2 (two) times daily as needed for muscle spasms., Disp: 30 tablet, Rfl: 0   Vitamin D, Ergocalciferol, (DRISDOL) 1.25 MG (50000 UNIT) CAPS capsule, Take 1 capsule (50,000 Units total) by mouth every 7 (seven) days., Disp: 20 capsule, Rfl: 3   zinc sulfate 220 (50 Zn) MG capsule, Take 1 capsule (220 mg total) by mouth daily., Disp: 30 capsule, Rfl: 0    Radiology:   CXR 02/25/2020: Persistent small bilateral pleural effusions, slightly increased on the left.  Cardiac Studies:   Lower extremity arterial duplex 05/12/2014:  No hemodynamically significant stenoses are identified in the bilateral lower extremity arterial system.This exam reveals normal perfusion of both the lower extremities with RABI 1.04 and LABI 1.09. There is mild diffuse disease involving the small vessels below the knee. Compared to the study done on 06/08/2013, no significant change. Impression: Lower extremity venous insufficiency study 12/25/2016: No venous insufficiency or DVT in the right lower extremity. Reflexes in the left greater saphenous vein beginning in the distal thigh and involving the entire length of the calf.  No obvious varicosities.  ABI 12/16/2018: Right: Resting right ankle-brachial index is within normal range. No evidence of significant right lower extremity arterial disease. Left: Resting left ankle-brachial index indicates noncompressible left lower extremity arteries.  Carotid artery duplex  05/07/2019: Stenosis in the bilateral internal carotid artery (16-49%), lower end of spectrum with heteregenous plaque.  Antegrade right vertebral artery flow. Antegrade left vertebral artery flow. Follow up in one year is appropriate if clinically indicated. Compared to 01/27/2018, no significant change.  Echocardiogram 03/17/2020: Mildly depressed LV systolic function with visual EF 40-45%. Left ventricle cavity is normal in size. Moderate concentric hypertrophy of the left ventricle. Left ventricle regional wall motion findings: Mid inferoseptal, Apical septal and Apical cap hypokinesis. Unable to evaluate diastolic function due to atrial fibrillation. Elevated LAP.  Left atrial cavity is severely dilated. Aortic valve sclerosis without stenosis. Mild (Grade I) mitral regurgitation. Mild tricuspid regurgitation. No evidence of pulmonary hypertension. RVSP measures 33 mmHg. Mild pulmonic regurgitation. IVC is dilated with blunted respiratory response. Compared to prior study dated 06/08/2013: LVEF was 60-65% and now 40-45%, RWMA is new, LA is now severely dilated.   Lexiscan Tetrofosmin  Stress Test 03/20/2020: Non-diagnostic ECG stress due to pharmacologic stress testing. Resting EKG demonstrated atrial flutter. Non-specific Twave abnormalities. Peak EKG revealed no significant ST-T change from baseline abnormality. Myocardial perfusion is normal. Mildly enlarged left ventricle. in both rest and stress. LV stress volume 170 ml. TID is normal. No stress lung uptake. Overall LV systolic function is low normal without regional wall motion  abnormalities. Stress LV EF: 51%. Compared to 05/28/2013, no  significant change. LV dilatation is new.  EKG  EKG 09/11/2022: Atrial fibrillation with controlled ventricular response at the rate of 58 bpm, normal axis, IRBBB, no evidence of ischemia.  No significant change from 03/13/2022.  Assessment     ICD-10-CM   1. Coronary artery disease involving native coronary artery of native heart without angina pectoris  I25.10 EKG 12-Lead    2. Essential hypertension  I10 propranolol ER (INDERAL LA) 80 MG 24 hr capsule    3. Permanent atrial fibrillation (HCC)  I48.21     4. Contraindication to anticoagulation therapy GU Bleed  Z53.09     5. Essential tremor  G25.0 propranolol ER (INDERAL LA) 80 MG 24 hr capsule     CHA2DS2-VASc Score is 4.  Yearly risk of stroke: 4.8% (A, HTN, DM, Vasc Dz).  Score of 1=0.6; 2=2.2; 3=3.2; 4=4.8; 5=7.2; 6=9.8; 7=>9.8) -(CHF; HTN; vasc disease DM,  Male = 1; Age <65 =0; 65-74 = 1,  >75 =2; stroke/embolism= 2).    Meds ordered this encounter  Medications   propranolol ER (INDERAL LA) 80 MG 24 hr capsule    Sig: Take 1 capsule (80 mg total) by mouth daily.    Dispense:  30 capsule    Refill:  2    Discontinue Coreg   Medications Discontinued During This Encounter  Medication Reason   ascorbic acid (VITAMIN C) 1000 MG tablet    oxyCODONE (OXY IR/ROXICODONE) 5 MG immediate release tablet    IRON SUCROSE IV    pregabalin (LYRICA) 25 MG capsule    Darbepoetin Alfa (ARANESP) 100 MCG/0.5ML SOSY injection    carvedilol (COREG) 12.5 MG tablet Change in therapy     Recommendations:   [name]  is a 72 y.o. Caucasian male with CABG in [place] 02/18/2004: RIMA to LAD, LIMA to obtuse marginal, SVG to diagonal and SVG to PDA, history of atrial flutter ablation on 04/06/2020 but now in permanent atrial fibrillation, unable to tolerate anticoagulation due to severe GI bleed, chronic thrombocytopenia, diabetes with diabetic complications including peripheral neuropathy, end-stage renal disease on hemodialysis, diabetic  foot ulcer, underwent left BKA in February 2023 and recently right BKA on 07/29/2022.  Since right BKA, patient states that he feels much better.  He is eager to get his prosthesis back and walk on the prosthesis so he can be dependent.  1. Coronary artery disease involving native coronary artery of native heart without angina pectoris Patient remains asymptomatic without chest pain or dyspnea.  He is feeling the best he has in quite a while since his left lower extremity amputation the septic features he had, petechiae it has resolved.  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  2. Essential hypertension Blood pressure is well-controlled now, he is taking losartan 50 mg twice daily.  3. Permanent atrial fibrillation (HCC) He is rate controlled.  Does not want to be on anticoagulation due to bleeding diathesis in the past.  He also has renal disease that is making him have high risk  for bleeding complications as well.  He has had severe hematuria in the past and he is concerned.  Contraindication for anticoagulation exists.  4.  Essential tremor Will discontinue carvedilol and switch him to propranolol to see whether his tremors would improve.  They will continue to monitor his blood pressure and report to me if blood pressure remains elevated or if there is a significant change or without difficulty with home dialysis.  I will see him back in 6 months.   Yates Decamp, MD, [facility] 09/11/2022, 3:00 PM Office: [phone] Pager: [phone]

## 2022-09-13 ENCOUNTER — Other Ambulatory Visit: Payer: Self-pay | Admitting: Physical Medicine and Rehabilitation

## 2022-09-13 LAB — HEPATITIS B CORE ANTIBODY, TOTAL: Hep B Core Total Ab: NONREACTIVE

## 2022-09-13 LAB — MITOCHONDRIAL ANTIBODIES: Mitochondrial M2 Ab, IgG: <=20 U (ref <=20.0)

## 2022-09-13 LAB — ANTI-SMOOTH MUSCLE ANTIBODY, IGG: Actin (Smooth Muscle) Antibody (IGG): <20 U (ref <20)

## 2022-09-13 LAB — IGG: IgG (Immunoglobin G), Serum: 713 mg/dL (ref 600–1540)

## 2022-09-13 LAB — ANA: Anti Nuclear Antibody (ANA): NEGATIVE

## 2022-09-13 MED ORDER — MODAFINIL 100 MG PO TABS: 100.0000 mg | ORAL_TABLET | Freq: Every day | ORAL | 3 refills | Status: DC

## 2022-09-16 NOTE — Telephone Encounter (Signed)
Spoke with pt and he was very upset when asked if he had returned the stool test, states "I DID IT THAT DAY I WAS SEEN." Asked pt if he had checked with his pcp regarding low NA and high K and he states he has an appt with his PCP. Reports he has a PCP and a Nephrologist that handle his NA and Potassium, reports he just wants to know what is wrong with his stomach. Discussed with him if his abdomen was distended Jill Side had wanted to order an [place] to look for fluid, pt states he does not want or need any more tests, just fix his stomach. Pt very agitated and unpleasant on the phone. No result in epic for [facility] and no result in portal. DD you may want to check on this lab, it was ordered at the time of the office visit.

## 2022-09-16 NOTE — Telephone Encounter (Signed)
Contacted pt & pt states he is bloated & has to use to bathroom every other hour. Pt states "he wants something to solve his problem".

## 2022-09-16 NOTE — Telephone Encounter (Signed)
The patient called to follow up and get some answer stated " he is in bad shape and needs some answer now ". Please advise.

## 2022-09-16 NOTE — Telephone Encounter (Signed)
[name], patient was previously contacted by DD on 09/09/2022 regarding his preliminary lab results which showed a low sodium and high potassium level. Patient was to follow up with his PCP and nephrologist regarding those results. Pls make sure he addressed these results with his PCP and nephrologist.   At the time of his office visit 6/3, a [facility] C. difficile and stool culture were ordered. Pls verify if patient did the stool tests or not? If he completed the test, pls follow up on result.   I planned on treating him with a course of Xifaxan for suspected SIBO if  C. Diff and stool cultures are negative.   Let him know his  liver tests were negative for Hep B or Hep C and negative for autoimmune liver disease.   If he feels his abdomen is swollen, send him for  RUQ sono to assess for ascites.  Patient is complex. Send to the ED if he has profound weakness, ongoing diarrhea or severe abdominal pain.   Dr. Marina Goodell, do you have other recommendations at this time?

## 2022-09-16 NOTE — Telephone Encounter (Signed)
Reviewed. Nothing to add to Colleen's comments and plans

## 2022-09-16 NOTE — Telephone Encounter (Signed)
DD, let me know when you get the [facility] C.diff/stool cx result. THX

## 2022-09-16 NOTE — Telephone Encounter (Signed)
Patient called states he has not heard from anyone regarding his results and he is in bad shape. Highly concerned requested a call back.

## 2022-09-16 NOTE — Telephone Encounter (Signed)
See additional phone note. 

## 2022-09-17 ENCOUNTER — Other Ambulatory Visit: Payer: Self-pay | Admitting: Nurse Practitioner

## 2022-09-17 MED ORDER — RIFAXIMIN 550 MG PO TABS: 550.0000 mg | ORAL_TABLET | Freq: Three times a day (TID) | ORAL | 0 refills | Status: AC

## 2022-09-17 NOTE — Telephone Encounter (Signed)
I called [name],  I apologized for the delay in obtaining his stool test results, the lab indicated the specimen was not dropped off with the order form. Patient's nurse is on way to our lab to drop off another [facility] stool/swab specimen. [name] stated his stool was more formed today. He stated his abdominal bloat is not worse but remains the same for the past 3 weeks.  He does not wish to pursue a CTAP to rule out any intra abdominal/pelvic pathology to explain his symptoms. Since his stool today was more formed, I less suspicion for C. Diff. He would like to start Xifaxan as previously discussed for possible SIBO. I sent RX for Xifaxan 550mg  po tid x 2 weeks to his pharmacy, cost would > $600. I was able to obtain samples of the full 14 day course of Xifaxan and his nurse will pick up the Xifaxan samples before 5pm. Patient excepts the risks of taking Xifaxan empirically for SIBO and he understands if his C. Diff test comes back positive that he will need a different treatment. He will go to the ED if he develops severe abdominal pain. He was content with this plan and was appreciative for the Xifaxan samples.

## 2022-09-17 NOTE — Telephone Encounter (Signed)
Inbound call from patient returning phone. Requesting a call back as soon as possible. Please advise, thank you.

## 2022-09-17 NOTE — Telephone Encounter (Signed)
Contacted [facility] & was told that the stool test was received with no forms from where the test was coming from. Also contacte pt & LVM to return call.

## 2022-09-17 NOTE — Telephone Encounter (Signed)
Contacted the lab & spoke with [place]. Sylvester Harder stated that they did not have the [facility] results & suggested that I called [facility].

## 2022-09-17 NOTE — Telephone Encounter (Signed)
Contacted pt & informed pt that the [facility] test will need to be redone. Pt verbalized understanding & agreed to redo [facility] test.

## 2022-09-18 ENCOUNTER — Telehealth: Payer: Self-pay | Admitting: Pharmacy Technician

## 2022-09-18 ENCOUNTER — Other Ambulatory Visit (HOSPITAL_COMMUNITY): Payer: Self-pay

## 2022-09-18 NOTE — Telephone Encounter (Signed)
Patient Advocate Encounter  Prior Authorization for XIFAXAN 550MG  has been approved with [facility].    PA# [id number] Effective dates: 5.29.24 through 6.26.24  Per [facility] test claim, copay for 14 days supply is $505.67

## 2022-09-18 NOTE — Telephone Encounter (Signed)
Patient Advocate Encounter  Received notification from [facility] that prior authorization for XIFAXAN 550MG  is required.   PA submitted on 6.12.24 Key [id number] Status is pending

## 2022-09-19 ENCOUNTER — Encounter: Payer: Self-pay | Admitting: Psychology

## 2022-09-19 ENCOUNTER — Encounter (HOSPITAL_BASED_OUTPATIENT_CLINIC_OR_DEPARTMENT_OTHER): Payer: Medicare Other | Admitting: Psychology

## 2022-09-19 ENCOUNTER — Telehealth: Payer: Self-pay

## 2022-09-19 DIAGNOSIS — R140 Abdominal distension (gaseous): Secondary | ICD-10-CM | POA: Diagnosis not present

## 2022-09-19 DIAGNOSIS — R400 Somnolence: Secondary | ICD-10-CM

## 2022-09-19 DIAGNOSIS — Z89512 Acquired absence of left leg below knee: Secondary | ICD-10-CM

## 2022-09-19 DIAGNOSIS — R413 Other amnesia: Secondary | ICD-10-CM

## 2022-09-19 DIAGNOSIS — R4189 Other symptoms and signs involving cognitive functions and awareness: Secondary | ICD-10-CM

## 2022-09-19 DIAGNOSIS — G4701 Insomnia due to medical condition: Secondary | ICD-10-CM

## 2022-09-19 NOTE — Telephone Encounter (Signed)
Contacted pt & pt was notified that his [facility] c diff test was negative.

## 2022-09-19 NOTE — Progress Notes (Signed)
Neuropsychological Consultation   Patient:   [name]   DOB:   [birth year]  MR Number:  [id number]  Location:  [facility] FOR PAIN AND [facility] MEDICINE [facility] PHYSICAL MEDICINE & REHABILITATION [address] [place], [address] [id number] [facility] [place] [address] Dept: [phone]           Date of Service:   09/19/2022  Location of Service and Individuals present: Today's visit was conducted in my outpatient clinic office with the patient, his wife and myself present.  Start Time:   3 PM End Time:   5 PM  Patient Consent and Confidentiality: Patient and wife were informed about the purpose of the visit being the beginning of the neuropsychological evaluation and limits to confidentiality including the fact that this evaluation will be made available to his referring physiatrist and also being in the patient's electronic medical records for appropriate healthcare providers access when appropriate.  Patient and wife consent to have this evaluation conducted.  Consent for Evaluation and Treatment:  Signed:  Yes Explanation of Privacy Policies:  Signed:  Yes Discussion of Confidentiality Limits:  Yes  Provider/Observer:  Arley Phenix, Psy.D.       Clinical Neuropsychologist       Billing Code/Service: 96116/96121  Chief Complaint:     Chief Complaint  Patient presents with   Memory Loss   Other    Word finding difficulty    Reason for Service:    [name] is a 72 year old male referred for neuropsychological evaluation by his treating physiatrist Sula Soda, MD due to reports of ongoing memory and cognitive changes as well as increasing word finding difficulties and a recent history of right below the amputation.  Patient has a past medical history included left BKA with prosthetic, hypertension, hyperlipidemia, CAD with CABG, A-fib with previous ablation, diabetes, peripheral vascular disease, prostate cancer, end-stage renal disease with  hemodialysis and chronic anemia.  Patient gets home hemodialysis.  Most recently probably patient presented to the emergency department on 07/22/2002 with altered mental status over the prior 2 days with low-grade fever and hypoxia as well as chronic right foot wound.  Patient required BiPAP on presentation with initial improvement but later his mental status declined and deteriorated subsequently required intubation and was extubated on 07/23/2022.  Cranial CT was negative for acute process.  Dr. Lajoyce Corners was consulted, who had been providing care previously, and limb was felt to not be salvageable.  Patient underwent right BKA on 07/26/2022 and patient was followed up for comprehensive inpatient rehabilitation unit following discharge.  Patient has been followed by orthopedics Aldean Baker, MD) and outpatient rehab as well as Dr. Carlis Abbott.  Patient and wife have continued described ongoing cognitive issues.  Although there have been significant improvements achieved during his hospitalization and after he continues to have memory difficulties and word finding difficulties.  Memory difficulties are consistent and described as both episodic and semantic issues as well as word finding difficulties.  There has been some improvement.  Patient's wife reports that there is also been changes in personality and behavioral style with more agitation and irritability post discharge.  Patient is also described as having a tremor in his left hand of unknown etiological factors.  There is no family history of progressive neurological disease with both parents passing away in their 28s of kidney disease/cancer/cardiovascular illness.  Patient did receive a prosthetic for his left BKA and it was working well but his increasing problems with his right foot  made it difficult.  The patient is now looking forward to getting a right prosthetic once he finishes healing.  Patient was septic and very sick prior to his second BKA.  Patient  continues on home dialysis but this is very stressful as the patient is not always agreeable and compliant to setting up the process which causes a lot of frustration for the wife and disagreements between the 2.  Medical History:   Past Medical History:  Diagnosis Date   Acute osteomyelitis of metatarsal bone of left foot (HCC)    Ambulates with cane    straight cane   Anemia    Cervical myelopathy (HCC) 02/06/2018   Chronic kidney disease    dailysis M W F- home   Community acquired pneumonia of left lower lobe of lung 08/18/2021   Complication of anesthesia    Coronary artery disease    Dehiscence of amputation stump of left lower extremity (HCC)    Diabetes mellitus without complication (HCC)    type2   Diabetic foot ulcer (HCC)    Diabetic neuropathy (HCC) 02/06/2018   Dysrhythmia    Gait abnormality 08/24/2018   GERD (gastroesophageal reflux disease)     01/06/20- not current   Gout    History of blood transfusion    History of blood transfusion    History of kidney stones    passed stones   Hypercholesteremia    Hypertension    Hypothyroidism    Neuromuscular disorder (HCC)    neuropathy left leg and bilateral feet   Neuropathy    Partial nontraumatic amputation of foot, 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   PONV (postoperative nausea and vomiting)    Prostate cancer (HCC)    PVD (peripheral vascular disease) (HCC)    with amputations         Patient Active Problem List   Diagnosis Date Noted   Right below-knee amputee (HCC) 07/29/2022   Severe sepsis (HCC) 07/23/2022   Foot infection 07/22/2022   Acute respiratory failure (HCC) 07/22/2022   Anemia due to chronic blood loss 06/23/2022   Heme positive stool 06/23/2022   Benign neoplasm of cecum 06/23/2022   Benign neoplasm of transverse colon 06/23/2022   Benign neoplasm of descending colon 06/23/2022   Abscess of right foot 05/17/2022   Contraindication to anticoagulation therapy GU Bleed 03/13/2022   Paroxysmal atrial  fibrillation (HCC) 08/18/2021   Anemia of chronic renal failure 08/18/2021   Thrombocytopenia (HCC) 08/18/2021   Acute metabolic encephalopathy 08/18/2021   Action induced myoclonus 08/18/2021   Left below-knee amputee (HCC) 05/11/2021   Symptomatic anemia 09/13/2020   COVID-19 virus infection 09/13/2020   Pressure injury of skin 06/29/2020   Gross hematuria 06/16/2020   Typical atrial flutter (HCC) 03/24/2020   Bilateral pleural effusion 02/24/2020   Healthcare maintenance 01/28/2020   Shortness of breath 01/28/2020   History of partial ray amputation of fourth toe of right foot (HCC) 09/08/2019   Chronic cough 06/25/2019   Cutaneous abscess of right foot    Subacute osteomyelitis, right ankle and foot (HCC) 12/14/2018   AKI (acute kidney injury) (HCC) 12/14/2018   ESRD on dialysis (HCC) 12/14/2018   S/P CABG (coronary artery bypass graft) 12/11/2018   Gait abnormality 08/24/2018   Diabetic neuropathy (HCC) 02/06/2018   Cervical myelopathy (HCC) 02/06/2018   Onychomycosis 10/30/2017   Other spondylosis with radiculopathy, cervical region 01/27/2017   Midfoot ulcer, right, limited to breakdown of skin (HCC) 11/15/2016   Lateral epicondylitis, left elbow 08/12/2016  Prostate cancer (HCC) 06/19/2016   Cellulitis of right foot 12/24/2015   Gout 09/14/2012   Hypertension associated with diabetes (HCC) 09/14/2012   Hypothyroidism 09/14/2012   CAD (coronary artery disease) 09/14/2012   Insulin dependent type 2 diabetes mellitus (HCC) 09/14/2012   Hyperlipidemia associated with type 2 diabetes mellitus (HCC)    Neuropathy (HCC)     Onset and Duration of Symptoms: Patient and wife acknowledge that symptoms have been developing over the past couple of years and the wife noting that he became acutely worse during his recent hospitalization and has improved since then but continues to have issues with memory.  Progression of Symptoms: Some recent improvement after exacerbation during  acute medical illness.  Associated Symptoms (e.g., cognitive, emotional, behavioral): Increased agitation and irritability.  Additional Tests and Measures from other records:  Neuroimaging Results: Patient had an MRI conducted on 10/16/2021 that showed minimal chronic small vessel changes within the cerebral white matter, mild generalized cerebral and cerebellar atrophy.  CT head without contrast conducted on 07/22/2022 again showed no evidence of acute infarction, hemorrhage, hydrocephalus or other acute process.  There continued to be mild subcortical white matter and periventricular small vessel ischemic changes noted.  Sleep: Patient has very erratic sleep pattern.  The patient and wife both report that some nights he sleeps little to none.  Most nights he wakes up around 2 AM and is not able to return back to sleep there are some uncommon nights where he sleeps through the night.  Diet Pattern: Patient is having significant gastrointestinal issues that have been more problematic with significant pain recently.  Behavioral Observation/Mental Status:   [name]  presents as a 72 y.o.-year-old Right handed Caucasian Male who appeared his stated age. his dress was Appropriate and he was Well Groomed and his manners were Appropriate to the situation.  his participation was indicative of Appropriate behaviors.  There were physical disabilities noted.  he displayed an appropriate level of cooperation and motivation.    Interactions:    Active Appropriate  Attention:   abnormal and attention span appeared shorter than expected for age  Memory:   abnormal; remote memory intact, recent memory impaired  Visuo-spatial:   not examined  Speech (Volume):  normal  Speech:   normal; normal  Thought Process:  Coherent and Relevant  Coherent, Linear, and Logical  Though Content:  WNL; not suicidal and not homicidal  Orientation:   person, place, time/date, and  situation  Judgment:   Good  Planning:   Fair  Affect:    Appropriate  Mood:    Dysphoric  Insight:   Good  Intelligence:   very high  Marital Status/Living:  The patient was born and raised in [place] along with 2 siblings.  Patient currently lives with his wife of 49 years and they have 2 adult son age 7 and 26.  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  Educational and Occupational History:     Highest Level of Education:  College patient had a successful educational career graduating from college and always doing well in school.  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  Hobbies and Interests: History   Psychiatric History:  No prior  psychiatric history  Family Med/Psych History:  Family History  Problem Relation Age of Onset   Diabetes Mellitus II Mother    Kidney disease Mother    Diabetes Mellitus II Father    CAD Father    Cancer Father        prostate   Kidney disease Father    Diabetes Mellitus II Brother    Kidney disease Brother    Diabetes Mellitus II Brother    Stomach cancer Brother 71   Kidney disease Brother    Colon cancer Neg Hx    Colon polyps Neg Hx    Esophageal cancer Neg Hx    Rectal cancer Neg Hx    Pancreatic cancer Neg Hx    Impression/DX:   [name] is a 72 year old male referred for neuropsychological evaluation by his treating physiatrist Sula Soda, MD due to reports of ongoing memory and cognitive changes as well as increasing word finding difficulties and a recent history of right below the amputation.  Patient has a past medical history included left BKA with prosthetic, hypertension, hyperlipidemia, CAD with CABG, A-fib with previous ablation,  diabetes, peripheral vascular disease, prostate cancer, end-stage renal disease with hemodialysis and chronic anemia.  Patient gets home hemodialysis.  Most recently probably patient presented to the emergency department on 07/22/2002 with altered mental status over the prior 2 days with low-grade fever and hypoxia as well as chronic right foot wound.  Patient required BiPAP on presentation with initial improvement but later his mental status declined and deteriorated subsequently required intubation and was extubated on 07/23/2022.  Cranial CT was negative for acute process.  Dr. Lajoyce Corners was consulted, who had been providing care previously, and limb was felt to not be salvageable.  Patient underwent right BKA on 07/26/2022 and patient was followed up for comprehensive inpatient rehabilitation unit following discharge.  Patient has been followed by orthopedics Aldean Baker, MD) and outpatient rehab as well as Dr. Carlis Abbott.  Patient and wife have continued described ongoing cognitive issues.  Although there have been significant improvements achieved during his hospitalization and after he continues to have memory difficulties and word finding difficulties.  Memory difficulties are consistent and described as both episodic and semantic issues as well as word finding difficulties.  There has been some improvement.  Patient's wife reports that there is also been changes in personality and behavioral style with more agitation and irritability post discharge.  Disposition/Plan:  We have set the patient up for formal neuropsychological evaluation and he will complete the Wechsler Adult Intelligence Scale and the Wechsler Memory Scale's as well as the controlled oral Word Association test.  Once this foundational battery is completed a determination will be made as to any other potential testing that might be needed.  Both answer the diagnostic concerns and considerations as well as facilitate practical recommendations going  forward.  Diagnosis:    Memory loss  Cognitive deficits  Insomnia due to medical condition  Hx of BKA, left (HCC)  Daytime somnolence        Note: This document was prepared using Dragon voice recognition software and may include unintentional dictation errors.   Electronically Signed   _______________________ Arley Phenix, Psy.D. Clinical Neuropsychologist

## 2022-09-23 ENCOUNTER — Encounter: Payer: Self-pay | Admitting: Orthopedic Surgery

## 2022-09-23 ENCOUNTER — Ambulatory Visit (INDEPENDENT_AMBULATORY_CARE_PROVIDER_SITE_OTHER): Payer: Medicare Other | Admitting: Orthopedic Surgery

## 2022-09-23 DIAGNOSIS — S88111A Complete traumatic amputation at level between knee and ankle, right lower leg, initial encounter: Secondary | ICD-10-CM

## 2022-09-23 DIAGNOSIS — S88112A Complete traumatic amputation at level between knee and ankle, left lower leg, initial encounter: Secondary | ICD-10-CM

## 2022-09-23 DIAGNOSIS — Z89512 Acquired absence of left leg below knee: Secondary | ICD-10-CM

## 2022-09-23 DIAGNOSIS — Z89511 Acquired absence of right leg below knee: Secondary | ICD-10-CM

## 2022-09-23 NOTE — Progress Notes (Signed)
Office Visit Note   Patient: [name]           Date of Birth: 02-Jul-1950           MRN: [id number] Visit Date: 09/23/2022              Requested by: Adolph Pollack, FNP [id number] [place],  [place] [address] PCP: Adolph Pollack, FNP  Chief Complaint  Patient presents with   Right Leg - Routine Post Op    07/26/2022 right BKA       HPI: Patient is a 72 year old gentleman who is 2 months status post right transtibial amputation.  He has a socket with socket for the left below-knee amputation.  Assessment & Plan: Visit Diagnoses:  1. Below-knee amputation of left lower extremity, initial encounter (HCC)   2. Below-knee amputation of right lower extremity, initial encounter [facility])     Plan: Patient is to follow-up with [facility] for prosthetic fitting on the right.  Anticipate patient will proceed with the socket with socket.  Order placed for physical therapy for patient to begin gait training once he has his prosthesis on the right.  Follow-Up Instructions: Return in about 3 months (around 12/24/2022).   Ortho Exam  Patient is alert, oriented, no adenopathy, well-dressed, normal affect, normal respiratory effort. Examination the right residual limb is well-healed and consolidating well.  There is a residual absorbable suture knot medially which is resolving.  Prescription provided for [facility] for prosthesis.  Patient is a new right transtibial  amputee.  Patient's current comorbidities are not expected to impact the ability to function with the prescribed prosthesis. Patient verbally communicates a strong desire to use a prosthesis. Patient currently requires mobility aids to ambulate without a prosthesis.  Expects not to use mobility aids with a new prosthesis.  Patient is a K2 level ambulator that will use a prosthesis to walk around their home and the community over low level environmental barriers.      Imaging: No results found. No images are  attached to the encounter.  Labs: Lab Results  Component Value Date   HGBA1C 5.0 05/17/2022   HGBA1C 5.5 05/04/2021   HGBA1C 6.3 (H) 02/02/2021   ESRSEDRATE 34 (H) 01/24/2020   ESRSEDRATE 41 (H) 01/10/2020   ESRSEDRATE 55 (H) 12/27/2019   CRP 8.8 (H) 01/24/2020   CRP 35.9 (H) 01/10/2020   CRP 77.9 (H) 12/27/2019   REPTSTATUS 07/27/2022 FINAL 07/22/2022   GRAMSTAIN  05/17/2022    ABUNDANT WBC PRESENT, PREDOMINANTLY PMN ABUNDANT GRAM POSITIVE COCCI ABUNDANT GRAM NEGATIVE RODS    CULT  07/22/2022    NO GROWTH 5 DAYS Performed at [facility], [address], [place], [place] [address]    LABORGA PROTEUS MIRABILIS 05/17/2022   LABORGA ENTEROCOCCUS FAECALIS 05/17/2022   LABORGA STAPHYLOCOCCUS AUREUS 05/17/2022     Lab Results  Component Value Date   ALBUMIN 4.1 09/09/2022   ALBUMIN 3.4 (L) 08/11/2022   ALBUMIN 3.2 (L) 08/10/2022   PREALBUMIN 32.7 05/09/2021   PREALBUMIN 14.8 (L) 12/14/2018    Lab Results  Component Value Date   MG 2.0 07/25/2022   MG 1.6 (L) 07/23/2022   MG 2.4 05/14/2021   Lab Results  Component Value Date   VD25OH 42.0 11/19/2021   VD25OH 16.13 (L) 05/09/2021    Lab Results  Component Value Date   PREALBUMIN 32.7 05/09/2021   PREALBUMIN 14.8 (L) 12/14/2018      Latest Ref Rng &  Units 09/09/2022   11:18 AM 08/10/2022    1:00 PM 08/08/2022    3:46 PM  CBC EXTENDED  WBC 4.0 - 10.5 K/uL 4.7  6.7  6.7   RBC 4.22 - 5.81 Mil/uL 3.10  3.11  2.99   Hemoglobin 13.0 - 17.0 g/dL 9.3  9.1  8.8   HCT 40.9 - 52.0 % 28.0  29.5  28.3   Platelets 150.0 - 400.0 K/uL 78.0  127  130   NEUT# 1.4 - 7.7 K/uL 3.7     Lymph# 0.7 - 4.0 K/uL 0.3        There is no height or weight on file to calculate BMI.  Orders:  No orders of the defined types were placed in this encounter.  No orders of the defined types were placed in this encounter.    Procedures: No procedures performed  Clinical Data: No additional findings.  ROS:  All other systems  negative, except as noted in the HPI. Review of Systems  Objective: Vital Signs: There were no vitals taken for this visit.  Specialty Comments:  No specialty comments available.  PMFS History: Patient Active Problem List   Diagnosis Date Noted   Right below-knee amputee (HCC) 07/29/2022   Severe sepsis (HCC) 07/23/2022   Foot infection 07/22/2022   Acute respiratory failure (HCC) 07/22/2022   Anemia due to chronic blood loss 06/23/2022   Heme positive stool 06/23/2022   Benign neoplasm of cecum 06/23/2022   Benign neoplasm of transverse colon 06/23/2022   Benign neoplasm of descending colon 06/23/2022   Abscess of right foot 05/17/2022   Contraindication to anticoagulation therapy GU Bleed 03/13/2022   Paroxysmal atrial fibrillation (HCC) 08/18/2021   Anemia of chronic renal failure 08/18/2021   Thrombocytopenia (HCC) 08/18/2021   Acute metabolic encephalopathy 08/18/2021   Action induced myoclonus 08/18/2021   Left below-knee amputee (HCC) 05/11/2021   Symptomatic anemia 09/13/2020   COVID-19 virus infection 09/13/2020   Pressure injury of skin 06/29/2020   Gross hematuria 06/16/2020   Typical atrial flutter (HCC) 03/24/2020   Bilateral pleural effusion 02/24/2020   Healthcare maintenance 01/28/2020   Shortness of breath 01/28/2020   History of partial ray amputation of fourth toe of right foot (HCC) 09/08/2019   Chronic cough 06/25/2019   Cutaneous abscess of right foot    Subacute osteomyelitis, right ankle and foot (HCC) 12/14/2018   AKI (acute kidney injury) (HCC) 12/14/2018   ESRD on dialysis (HCC) 12/14/2018   S/P CABG (coronary artery bypass graft) 12/11/2018   Gait abnormality 08/24/2018   Diabetic neuropathy (HCC) 02/06/2018   Cervical myelopathy (HCC) 02/06/2018   Onychomycosis 10/30/2017   Other spondylosis with radiculopathy, cervical region 01/27/2017   Midfoot ulcer, right, limited to breakdown of skin (HCC) 11/15/2016   Lateral epicondylitis, left  elbow 08/12/2016   Prostate cancer (HCC) 06/19/2016   Cellulitis of right foot 12/24/2015   Gout 09/14/2012   Hypertension associated with diabetes (HCC) 09/14/2012   Hypothyroidism 09/14/2012   CAD (coronary artery disease) 09/14/2012   Insulin dependent type 2 diabetes mellitus (HCC) 09/14/2012   Hyperlipidemia associated with type 2 diabetes mellitus (HCC)    Neuropathy (HCC)    Past Medical History:  Diagnosis Date   Acute osteomyelitis of metatarsal bone of left foot (HCC)    Ambulates with cane    straight cane   Anemia    Cervical myelopathy (HCC) 02/06/2018   Chronic kidney disease    dailysis [facility]- home   Community acquired  pneumonia of left lower lobe of lung 08/18/2021   Complication of anesthesia    Coronary artery disease    Dehiscence of amputation stump of left lower extremity (HCC)    Diabetes mellitus without complication (HCC)    type2   Diabetic foot ulcer (HCC)    Diabetic neuropathy (HCC) 02/06/2018   Dysrhythmia    Gait abnormality 08/24/2018   GERD (gastroesophageal reflux disease)     01/06/20- not current   Gout    History of blood transfusion    History of blood transfusion    History of kidney stones    passed stones   Hypercholesteremia    Hypertension    Hypothyroidism    Neuromuscular disorder (HCC)    neuropathy left leg and bilateral feet   Neuropathy    Partial nontraumatic amputation of foot, 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   PONV (postoperative nausea and vomiting)    Prostate cancer (HCC)    PVD (peripheral vascular disease) (HCC)    with amputations    Family History  Problem Relation Age of Onset   Diabetes Mellitus II Mother    Kidney disease Mother    Diabetes Mellitus II Father    CAD Father    Cancer Father        prostate   Kidney disease Father    Diabetes Mellitus II Brother    Kidney disease Brother    Diabetes Mellitus II Brother    Stomach cancer Brother 45   Kidney disease Brother    Colon cancer Neg Hx    Colon  polyps Neg Hx    Esophageal cancer Neg Hx    Rectal cancer Neg Hx    Pancreatic cancer Neg Hx     Past Surgical History:  Procedure Laterality Date   A-FLUTTER ABLATION N/A 04/06/2020   Procedure: A-FLUTTER ABLATION;  Surgeon: Marinus Maw, MD;  Location: [facility] LAB;  Service: Cardiovascular;  Laterality: N/A;   AMPUTATION Left 12/25/2013   Procedure: AMPUTATION RAY LEFT 5TH RAY;  Surgeon: Nadara Mustard, MD;  Location: [facility] ORS;  Service: Orthopedics;  Laterality: Left;   AMPUTATION Right 12/15/2018   Procedure: AMPUTATION OF 4TH AND 5TH TOES RIGHT FOOT;  Surgeon: Nadara Mustard, MD;  Location: [facility] OR;  Service: Orthopedics;  Laterality: Right;   AMPUTATION Left 02/02/2021   Procedure: LEFT FOOT 4TH RAY AMPUTATION;  Surgeon: Nadara Mustard, MD;  Location: [facility] OR;  Service: Orthopedics;  Laterality: Left;   AMPUTATION Left 05/09/2021   Procedure: LEFT BELOW KNEE AMPUTATION;  Surgeon: Nadara Mustard, MD;  Location: [facility] OR;  Service: Orthopedics;  Laterality: Left;   AMPUTATION Right 07/26/2022   Procedure: RIGHT BELOW KNEE AMPUTATION;  Surgeon: Nadara Mustard, MD;  Location: [facility] OR;  Service: Orthopedics;  Laterality: Right;   APPLICATION OF WOUND VAC Right 12/15/2018   Procedure: APPLICATION OF WOUND VAC;  Surgeon: Nadara Mustard, MD;  Location: [facility] OR;  Service: Orthopedics;  Laterality: Right;   AV FISTULA PLACEMENT Left 03/25/2019   Procedure: LEFT ARM ARTERIOVENOUS (AV) FISTULA CREATION;  Surgeon: Nada Libman, MD;  Location: [facility] OR;  Service: Vascular;  Laterality: Left;   BACK SURGERY     BASCILIC VEIN TRANSPOSITION Left 05/20/2019   Procedure: SECOND STAGE LEFT BASCILIC VEIN TRANSPOSITION;  Surgeon: Nada Libman, MD;  Location: [facility] OR;  Service: Vascular;  Laterality: Left;   BIOPSY  06/22/2022   Procedure: BIOPSY;  Surgeon: Lemar Lofty., MD;  Location: [facility] ENDOSCOPY;  Service: Gastroenterology;;   CARDIAC CATHETERIZATION  02/17/2014   CHOLECYSTECTOMY     COLONOSCOPY   2011   in [place], Normal   COLONOSCOPY N/A 06/23/2022   Procedure: COLONOSCOPY;  Surgeon: Sherrilyn Rist, MD;  Location: [facility] ENDOSCOPY;  Service: Gastroenterology;  Laterality: N/A;   CORONARY ARTERY BYPASS GRAFT  2008   CYSTOSCOPY N/A 06/29/2020   Procedure: CYSTOSCOPY WITH CLOT EVACUATION AND  FULGERATION;  Surgeon: Marcine Matar, MD;  Location: [facility] OR;  Service: Urology;  Laterality: N/A;   CYSTOSCOPY WITH FULGERATION Bilateral 06/17/2020   Procedure: CYSTOSCOPY,BILATERAL RETROGRADE, CLOT EVACUATION WITH FULGERATION OF THE BLADDER;  Surgeon: Jannifer Hick, MD;  Location: [facility] OR;  Service: Urology;  Laterality: Bilateral;   CYSTOSCOPY WITH FULGERATION N/A 06/28/2020   Procedure: CYSTOSCOPY WITH CLOT EVACUATION AND FULGERATION OF BLEEDERS;  Surgeon: Marcine Matar, MD;  Location: [facility] OR;  Service: Urology;  Laterality: N/A;  1 HR   ENTEROSCOPY N/A 06/22/2022   Procedure: ENTEROSCOPY;  Surgeon: Meridee Score Netty Starring., MD;  Location: [facility] ENDOSCOPY;  Service: Gastroenterology;  Laterality: N/A;   HEMOSTASIS CLIP PLACEMENT  06/22/2022   Procedure: HEMOSTASIS CLIP PLACEMENT;  Surgeon: Lemar Lofty., MD;  Location: [facility] ENDOSCOPY;  Service: Gastroenterology;;   HOT HEMOSTASIS N/A 06/22/2022   Procedure: HOT HEMOSTASIS (ARGON PLASMA COAGULATION/BICAP);  Surgeon: Lemar Lofty., MD;  Location: [facility] ENDOSCOPY;  Service: Gastroenterology;  Laterality: N/A;   I & D EXTREMITY Right 12/15/2018   Procedure: DEBRIDEMENT RIGHT FOOT;  Surgeon: Nadara Mustard, MD;  Location: [facility] OR;  Service: Orthopedics;  Laterality: Right;   I & D EXTREMITY Right 08/27/2019   Procedure: PARTIAL CUBOID EXCISION RIGHT FOOT;  Surgeon: Nadara Mustard, MD;  Location: [facility] OR;  Service: Orthopedics;  Laterality: Right;   I & D EXTREMITY Right 01/07/2020   Procedure: RIGHT FOOT EXCISION INFECTED BONE;  Surgeon: Nadara Mustard, MD;  Location: [facility] OR;  Service: Orthopedics;  Laterality: Right;   I & D EXTREMITY Right  05/17/2022   Procedure: IRRIGATION AND DEBRIDEMENT RIGHT FOOT ABSCESS;  Surgeon: Nadara Mustard, MD;  Location: [facility] OR;  Service: Orthopedics;  Laterality: Right;   NECK SURGERY     novemver 2019   POLYPECTOMY  06/22/2022   Procedure: POLYPECTOMY;  Surgeon: Mansouraty, Netty Starring., MD;  Location: [facility] ENDOSCOPY;  Service: Gastroenterology;;   POLYPECTOMY  06/23/2022   Procedure: POLYPECTOMY;  Surgeon: Sherrilyn Rist, MD;  Location: [facility] ENDOSCOPY;  Service: Gastroenterology;;   STUMP REVISION Left 06/27/2021   Procedure: REVISION LEFT BELOW KNEE AMPUTATION;  Surgeon: Nadara Mustard, MD;  Location: [facility] OR;  Service: Orthopedics;  Laterality: Left;   SUBMUCOSAL LIFTING INJECTION  06/22/2022   Procedure: SUBMUCOSAL LIFTING INJECTION;  Surgeon: Meridee Score Netty Starring., MD;  Location: [facility] ENDOSCOPY;  Service: Gastroenterology;;   SUBMUCOSAL TATTOO INJECTION  06/22/2022   Procedure: SUBMUCOSAL TATTOO INJECTION;  Surgeon: Lemar Lofty., MD;  Location: [facility] ENDOSCOPY;  Service: Gastroenterology;;   TRANSURETHRAL RESECTION OF PROSTATE N/A 06/28/2020   Procedure: TRANSURETHRAL RESECTION OF THE PROSTATE (TURP);  Surgeon: Marcine Matar, MD;  Location: [facility] OR;  Service: Urology;  Laterality: N/A;   WISDOM TOOTH EXTRACTION     Social History   Occupational History   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

## 2022-09-25 ENCOUNTER — Encounter: Payer: Self-pay | Admitting: Nurse Practitioner

## 2022-09-27 ENCOUNTER — Telehealth: Payer: Self-pay | Admitting: Nurse Practitioner

## 2022-09-27 NOTE — Telephone Encounter (Signed)
[place], ok to schedule patient for an abdominal/pelvic CT scan with oral contrast only. Dr. Marina Goodell, at this juncture, I recommend for patient to schedule an office appointment with you as his symptoms have not improved. Please coordinate timing of follow up appointment with Glendora Score or Viviann Spare. Appreciate your help. I will forward CT results to you when received. Agree with sending patient to ED if his symptoms worsen.

## 2022-09-27 NOTE — Telephone Encounter (Signed)
PT feels the treatment he was given is not working and there has been no change In his BM. Requesting callback to discuss options for relief

## 2022-09-27 NOTE — Telephone Encounter (Signed)
Patient called in with ongoing complaints of bloating, diarrhea & intermittent, mild generalized abdominal pain. He is experiencing diarrhea soon after every meal. He's been taking Xifaxan as prescribed for a little over a week now, and has not noticed any changes in symptoms. He's also tried IBGard OTC which has not provided any relief. Lactaid was also recommended at his last OV, however he says he hasn't been as consistent with this. He just had a UA collected at PCP office & is unsure if the possible UTI could be related to bloating. He was previously advised to do a CT & if those are still Colleen's recommendations then he would like to be scheduled. Advised him I would reach out to NP & in the meantime discussed ED precautions if symptoms worsen. Pt verbalized all understanding. Last seen in OV 09/09/22.

## 2022-09-30 ENCOUNTER — Other Ambulatory Visit: Payer: Self-pay

## 2022-09-30 DIAGNOSIS — R109 Unspecified abdominal pain: Secondary | ICD-10-CM

## 2022-09-30 DIAGNOSIS — R140 Abdominal distension (gaseous): Secondary | ICD-10-CM

## 2022-09-30 DIAGNOSIS — R197 Diarrhea, unspecified: Secondary | ICD-10-CM

## 2022-09-30 NOTE — Telephone Encounter (Signed)
Pt made aware of Alcide Evener NP recommendations: Pt was scheduled for CT scan on 10/15/2022 at [facility]. Pt to arrive at 11:15, start drinking oral contrast at 11:30 and have CT scan at 1:30:. Nothing to eat or drink 4 hours prior. Pt made aware. Pt giving the number to Radiology as pt was requesting an sooner appointment. Pt has  Dialysis [facility] Pt scheduled for an office visit with Dr. Marina Goodell on 11/01/2022 at 9:00 AM. Pt made aware Pt verbalized understanding with all questions answered.

## 2022-10-08 ENCOUNTER — Ambulatory Visit (HOSPITAL_COMMUNITY)
Admission: RE | Admit: 2022-10-08 | Discharge: 2022-10-08 | Disposition: A | Discharge status: Home / Self Care | Payer: Medicare Other | Source: Ambulatory Visit | Attending: Nurse Practitioner | Admitting: Nurse Practitioner

## 2022-10-08 DIAGNOSIS — R109 Unspecified abdominal pain: Secondary | ICD-10-CM | POA: Diagnosis present

## 2022-10-08 DIAGNOSIS — R140 Abdominal distension (gaseous): Secondary | ICD-10-CM | POA: Diagnosis present

## 2022-10-08 DIAGNOSIS — R197 Diarrhea, unspecified: Secondary | ICD-10-CM | POA: Diagnosis present

## 2022-10-08 MED ORDER — IOHEXOL 9 MG/ML PO SOLN
ORAL | Status: AC
  Filled 2022-10-08: qty 1000

## 2022-10-09 ENCOUNTER — Other Ambulatory Visit: Payer: Self-pay

## 2022-10-09 DIAGNOSIS — R188 Other ascites: Secondary | ICD-10-CM

## 2022-10-09 DIAGNOSIS — K7460 Unspecified cirrhosis of liver: Secondary | ICD-10-CM

## 2022-10-11 ENCOUNTER — Other Ambulatory Visit (INDEPENDENT_AMBULATORY_CARE_PROVIDER_SITE_OTHER): Payer: Medicare Other

## 2022-10-11 ENCOUNTER — Ambulatory Visit (HOSPITAL_COMMUNITY)
Admission: RE | Admit: 2022-10-11 | Discharge: 2022-10-11 | Disposition: A | Discharge status: Home / Self Care | Payer: Medicare Other | Source: Ambulatory Visit | Attending: Nurse Practitioner | Admitting: Nurse Practitioner

## 2022-10-11 DIAGNOSIS — R188 Other ascites: Secondary | ICD-10-CM

## 2022-10-11 DIAGNOSIS — K7460 Unspecified cirrhosis of liver: Secondary | ICD-10-CM

## 2022-10-11 HISTORY — PX: IR PARACENTESIS: IMG2679

## 2022-10-11 LAB — CBC WITH DIFFERENTIAL/PLATELET
Basophils Absolute: 0 10*3/uL (ref 0.0–0.1)
Basophils Relative: 1 % (ref 0.0–3.0)
Eosinophils Absolute: 0.1 10*3/uL (ref 0.0–0.7)
Eosinophils Relative: 2.6 % (ref 0.0–5.0)
HCT: 30 % — ABNORMAL LOW (ref 39.0–52.0)
Hemoglobin: 9.7 g/dL — ABNORMAL LOW (ref 13.0–17.0)
Lymphocytes Relative: 6.2 % — ABNORMAL LOW (ref 12.0–46.0)
Lymphs Abs: 0.3 10*3/uL — ABNORMAL LOW (ref 0.7–4.0)
MCHC: 32.2 g/dL (ref 30.0–36.0)
MCV: 90.1 fl (ref 78.0–100.0)
Monocytes Absolute: 0.3 10*3/uL (ref 0.1–1.0)
Monocytes Relative: 6.4 % (ref 3.0–12.0)
Neutro Abs: 4.1 10*3/uL (ref 1.4–7.7)
Neutrophils Relative %: 83.8 % — ABNORMAL HIGH (ref 43.0–77.0)
Platelets: 75 10*3/uL — ABNORMAL LOW (ref 150.0–400.0)
RBC: 3.33 Mil/uL — ABNORMAL LOW (ref 4.22–5.81)
RDW: 17.3 % — ABNORMAL HIGH (ref 11.5–15.5)
WBC: 4.9 10*3/uL (ref 4.0–10.5)

## 2022-10-11 LAB — BODY FLUID CELL COUNT WITH DIFFERENTIAL
Eos, Fluid: 0 %
Lymphs, Fluid: 55 %
Monocyte-Macrophage-Serous Fluid: 17 % — ABNORMAL LOW (ref 50–90)
Neutrophil Count, Fluid: 28 % — ABNORMAL HIGH (ref 0–25)
Total Nucleated Cell Count, Fluid: 310 cu mm (ref 0–1000)

## 2022-10-11 LAB — ALBUMIN, PLEURAL OR PERITONEAL FLUID: Albumin, Fluid: 2.4 g/dL

## 2022-10-11 LAB — PROTIME-INR
INR: 1.5 ratio — ABNORMAL HIGH (ref 0.8–1.0)
Prothrombin Time: 15.9 s — ABNORMAL HIGH (ref 9.6–13.1)

## 2022-10-11 LAB — COMPREHENSIVE METABOLIC PANEL
ALT: 19 U/L (ref 0–53)
AST: 25 U/L (ref 0–37)
Albumin: 4.2 g/dL (ref 3.5–5.2)
Alkaline Phosphatase: 194 U/L — ABNORMAL HIGH (ref 39–117)
BUN: 66 mg/dL — ABNORMAL HIGH (ref 6–23)
CO2: 28 mEq/L (ref 19–32)
Calcium: 8.9 mg/dL (ref 8.4–10.5)
Chloride: 94 mEq/L — ABNORMAL LOW (ref 96–112)
Creatinine, Ser: 7.18 mg/dL (ref 0.40–1.50)
GFR: 7.03 mL/min — CL (ref >60.00)
Glucose, Bld: 111 mg/dL — ABNORMAL HIGH (ref 70–99)
Potassium: 4.7 mEq/L (ref 3.5–5.1)
Sodium: 134 mEq/L — ABNORMAL LOW (ref 135–145)
Total Bilirubin: 1.2 mg/dL (ref 0.2–1.2)
Total Protein: 6.6 g/dL (ref 6.0–8.3)

## 2022-10-11 LAB — PROTEIN, PLEURAL OR PERITONEAL FLUID: Total protein, fluid: 4.1 g/dL

## 2022-10-11 NOTE — Procedures (Signed)
PROCEDURE SUMMARY:  Successful ultrasound guided paracentesis from the left lower quadrant.  Yielded 2.9 of amber fluid.  No immediate complications.  The patient tolerated the procedure well.   Specimen was sent for labs.  EBL < 2mL  If the patient eventually requires >/=2 paracenteses in a 30 day period, screening evaluation by the [facility] will be assessed.

## 2022-10-12 LAB — BODY FLUID CULTURE W GRAM STAIN

## 2022-10-13 LAB — BODY FLUID CULTURE W GRAM STAIN: Culture: NO GROWTH

## 2022-10-14 ENCOUNTER — Other Ambulatory Visit: Payer: Self-pay | Admitting: Cardiology

## 2022-10-14 DIAGNOSIS — I5032 Chronic diastolic (congestive) heart failure: Secondary | ICD-10-CM

## 2022-10-14 DIAGNOSIS — I2510 Atherosclerotic heart disease of native coronary artery without angina pectoris: Secondary | ICD-10-CM

## 2022-10-14 LAB — CYTOLOGY - NON PAP

## 2022-10-14 LAB — BODY FLUID CULTURE W GRAM STAIN

## 2022-10-14 NOTE — Progress Notes (Signed)
ICD-10-CM   1. Coronary artery disease involving native coronary artery of native heart without angina pectoris  I25.10 PCV ECHOCARDIOGRAM COMPLETE    2. Chronic diastolic heart failure (HCC)  W09.81 PCV ECHOCARDIOGRAM COMPLETE

## 2022-10-14 NOTE — Progress Notes (Signed)
I am placing an order for echo. Please let me know the date. I will discuss with patient once echo done. He is on Dialysis and hence fluid overload is less likely etiology. Also he has not had clinical heart failure. Will also have to exclude pericardial effusion or constrictive pericarditis.   Schedule OV after echo please

## 2022-10-15 ENCOUNTER — Other Ambulatory Visit: Payer: Self-pay

## 2022-10-15 ENCOUNTER — Other Ambulatory Visit (HOSPITAL_COMMUNITY): Payer: Medicare Other

## 2022-10-15 DIAGNOSIS — K7460 Unspecified cirrhosis of liver: Secondary | ICD-10-CM

## 2022-10-15 DIAGNOSIS — R188 Other ascites: Secondary | ICD-10-CM

## 2022-10-15 LAB — BODY FLUID CULTURE W GRAM STAIN

## 2022-10-24 ENCOUNTER — Telehealth: Payer: Self-pay | Admitting: Physical Therapy

## 2022-10-24 ENCOUNTER — Other Ambulatory Visit: Payer: Self-pay

## 2022-10-24 DIAGNOSIS — S88112A Complete traumatic amputation at level between knee and ankle, left lower leg, initial encounter: Secondary | ICD-10-CM

## 2022-10-24 DIAGNOSIS — S88111A Complete traumatic amputation at level between knee and ankle, right lower leg, initial encounter: Secondary | ICD-10-CM

## 2022-10-24 NOTE — Telephone Encounter (Signed)
Mr. Parson is getting his prosthesis on 7/25 for his right leg that was recently amputated.  Can you please put in a PT referral for prosthetic training with Bilateral BKAs? Thank you Zella Ball

## 2022-10-24 NOTE — Telephone Encounter (Signed)
Order is in for PT per request Zella Ball asked that I make you aware he does not get his prosthetic until 10/31/22 and to make appt for PT after that time. Thanks!

## 2022-11-01 ENCOUNTER — Ambulatory Visit (INDEPENDENT_AMBULATORY_CARE_PROVIDER_SITE_OTHER): Payer: Medicare Other | Admitting: Internal Medicine

## 2022-11-01 ENCOUNTER — Encounter: Payer: Self-pay | Admitting: Internal Medicine

## 2022-11-01 VITALS — BP 116/58 | HR 56 | Ht 70.0 in | Wt 185.0 lb

## 2022-11-01 DIAGNOSIS — R188 Other ascites: Secondary | ICD-10-CM | POA: Diagnosis not present

## 2022-11-01 DIAGNOSIS — Z86010 Personal history of colonic polyps: Secondary | ICD-10-CM

## 2022-11-01 MED ORDER — PANTOPRAZOLE SODIUM 40 MG PO TBEC: 40.0000 mg | DELAYED_RELEASE_TABLET | Freq: Two times a day (BID) | ORAL | 3 refills | Status: DC

## 2022-11-01 NOTE — Patient Instructions (Signed)
You have been scheduled for an abdominal paracentesis at [facility] radiology (1st floor of hospital) on 11/05/2022 at 1:30pm. Please arrive at least 15 minutes prior to your appointment time for registration. Should you need to reschedule this appointment for any reason, please call our office at [phone].  _______________________________________________________  If your blood pressure at your visit was 140/90 or greater, please contact your primary care physician to follow up on this.  _______________________________________________________  If you are age 64 or older, your body mass index should be between 23-30. Your Body mass index is 26.54 kg/m. If this is out of the aforementioned range listed, please consider follow up with your Primary Care Provider.  If you are age 36 or younger, your body mass index should be between 19-25. Your Body mass index is 26.54 kg/m. If this is out of the aformentioned range listed, please consider follow up with your Primary Care Provider.   ________________________________________________________  The [place] [facility] providers would like to encourage you to use [facility] to communicate with providers for non-urgent requests or questions.  Due to long hold times on the telephone, sending your provider a message by [facility] may be a faster and more efficient way to get a response.  Please allow 48 business hours for a response.  Please remember that this is for non-urgent requests.  _______________________________________________________

## 2022-11-01 NOTE — Progress Notes (Signed)
HISTORY OF PRESENT ILLNESS:  [name] is a 72 y.o. male with multiple significant medical problems, including but not limited to end-stage renal disease on dialysis, diabetes mellitus, hypertension, coronary artery disease, atrial fibrillation status post ablation, cervical myelopathy, prostate cancer, and chronic anemia.  He is also status post bilateral below the knee amputations.  He presents today for scheduled follow-up regarding problems with abdominal discomfort related to ascites and a question of occult liver disease.  The patient was initially seen in this office May 08, 2020 regarding chronic nausea.  See that dictation for details.  He subsequently underwent upper endoscopy May 11, 2020.  This was normal.  Subsequent gastric emptying scan was normal.  He was seen in follow-up June 14, 2020.  His nausea had resolved.  He was on PPI and Compazine as needed.  He was seen in the office again on December 20, 2021.  Problems are chronic nausea with decreased appetite and weight loss.  Symptoms were felt likely due to his chronic medical problems such as renal disease on dialysis and Ozempic.  See that dictation for details.  He was hospitalized March 2024.  GI was consulted regarding iron deficiency anemia.  He underwent colonoscopy and upper endoscopy.  Colonoscopy revealed multiple adenomatous colon polyps, diverticulosis, and hemorrhoids.  Upper endoscopy/small bowel enteroscopy revealed some small AVMs which were treated and a duodenal polyp which was resected.  He was seen again in the office June 09, 2022 regarding abdominal bloating discomfort.  He was found to have ascites.  He underwent 2.9 L paracentesis.  He felt this helped some but incompletely.  Fluid analysis was INCONSISTENT with portal hypertension as the cause.  We have reached out to cardiology regarding possible cardiac contribution to his ascites.  This is being worked up.  He presents today with his wife.   Wondering what might be done about the ascites.  Wondering about the cause.  Really no new complaints.  Recent blood work, fluid analysis, and imaging studies reviewed.  He was question of possible subtle changes of the liver (noncontrast enhanced).  However there has been no evidence for portal hypertension on imaging.  No varices on endoscopy.  REVIEW OF SYSTEMS:  All non-GI ROS negative except for  Past Medical History:  Diagnosis Date   Acute osteomyelitis of metatarsal bone of left foot (HCC)    Ambulates with cane    straight cane   Anemia    Cervical myelopathy (HCC) 02/06/2018   Chronic kidney disease    dailysis [facility]- home   Community acquired pneumonia of left lower lobe of lung 08/18/2021   Complication of anesthesia    Coronary artery disease    Dehiscence of amputation stump of left lower extremity (HCC)    Diabetes mellitus without complication (HCC)    type2   Diabetic foot ulcer (HCC)    Diabetic neuropathy (HCC) 02/06/2018   Dysrhythmia    Gait abnormality 08/24/2018   GERD (gastroesophageal reflux disease)     01/06/20- not current   Gout    History of blood transfusion    History of blood transfusion    History of kidney stones    passed stones   Hypercholesteremia    Hypertension    Hypothyroidism    Neuromuscular disorder (HCC)    neuropathy left leg and bilateral feet   Neuropathy    Partial nontraumatic amputation of foot, 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   PONV (postoperative nausea and vomiting)    Prostate  cancer [facility])    PVD (peripheral vascular disease) (HCC)    with amputations    Past Surgical History:  Procedure Laterality Date   A-FLUTTER ABLATION N/A 04/06/2020   Procedure: A-FLUTTER ABLATION;  Surgeon: Marinus Maw, MD;  Location: [facility];  Service: Cardiovascular;  Laterality: N/A;   AMPUTATION Left 12/25/2013   Procedure: AMPUTATION RAY LEFT 5TH RAY;  Surgeon: Nadara Mustard, MD;  Location: [facility] ORS;  Service: Orthopedics;   Laterality: Left;   AMPUTATION Right 12/15/2018   Procedure: AMPUTATION OF 4TH AND 5TH TOES RIGHT FOOT;  Surgeon: Nadara Mustard, MD;  Location: [facility] OR;  Service: Orthopedics;  Laterality: Right;   AMPUTATION Left 02/02/2021   Procedure: LEFT FOOT 4TH RAY AMPUTATION;  Surgeon: Nadara Mustard, MD;  Location: [facility] OR;  Service: Orthopedics;  Laterality: Left;   AMPUTATION Left 05/09/2021   Procedure: LEFT BELOW KNEE AMPUTATION;  Surgeon: Nadara Mustard, MD;  Location: [facility] OR;  Service: Orthopedics;  Laterality: Left;   AMPUTATION Right 07/26/2022   Procedure: RIGHT BELOW KNEE AMPUTATION;  Surgeon: Nadara Mustard, MD;  Location: [facility] OR;  Service: Orthopedics;  Laterality: Right;   APPLICATION OF WOUND VAC Right 12/15/2018   Procedure: APPLICATION OF WOUND VAC;  Surgeon: Nadara Mustard, MD;  Location: [facility] OR;  Service: Orthopedics;  Laterality: Right;   AV FISTULA PLACEMENT Left 03/25/2019   Procedure: LEFT ARM ARTERIOVENOUS (AV) FISTULA CREATION;  Surgeon: Nada Libman, MD;  Location: [facility] OR;  Service: Vascular;  Laterality: Left;   BACK SURGERY     BASCILIC VEIN TRANSPOSITION Left 05/20/2019   Procedure: SECOND STAGE LEFT BASCILIC VEIN TRANSPOSITION;  Surgeon: Nada Libman, MD;  Location: [facility] OR;  Service: Vascular;  Laterality: Left;   BIOPSY  06/22/2022   Procedure: BIOPSY;  Surgeon: Lemar Lofty., MD;  Location: [facility] ENDOSCOPY;  Service: Gastroenterology;;   CARDIAC CATHETERIZATION  02/17/2014   CHOLECYSTECTOMY     COLONOSCOPY  2011   in [place], Normal   COLONOSCOPY N/A 06/23/2022   Procedure: COLONOSCOPY;  Surgeon: Sherrilyn Rist, MD;  Location: [facility] ENDOSCOPY;  Service: Gastroenterology;  Laterality: N/A;   CORONARY ARTERY BYPASS GRAFT  2008   CYSTOSCOPY N/A 06/29/2020   Procedure: CYSTOSCOPY WITH CLOT EVACUATION AND  FULGERATION;  Surgeon: Marcine Matar, MD;  Location: [facility] OR;  Service: Urology;  Laterality: N/A;   CYSTOSCOPY WITH FULGERATION Bilateral 06/17/2020   Procedure:  CYSTOSCOPY,BILATERAL RETROGRADE, CLOT EVACUATION WITH FULGERATION OF THE BLADDER;  Surgeon: Jannifer Hick, MD;  Location: [facility] OR;  Service: Urology;  Laterality: Bilateral;   CYSTOSCOPY WITH FULGERATION N/A 06/28/2020   Procedure: CYSTOSCOPY WITH CLOT EVACUATION AND FULGERATION OF BLEEDERS;  Surgeon: Marcine Matar, MD;  Location: [facility] OR;  Service: Urology;  Laterality: N/A;  1 HR   ENTEROSCOPY N/A 06/22/2022   Procedure: ENTEROSCOPY;  Surgeon: Meridee Score Netty Starring., MD;  Location: [facility] ENDOSCOPY;  Service: Gastroenterology;  Laterality: N/A;   HEMOSTASIS CLIP PLACEMENT  06/22/2022   Procedure: HEMOSTASIS CLIP PLACEMENT;  Surgeon: Lemar Lofty., MD;  Location: [facility] ENDOSCOPY;  Service: Gastroenterology;;   HOT HEMOSTASIS N/A 06/22/2022   Procedure: HOT HEMOSTASIS (ARGON PLASMA COAGULATION/BICAP);  Surgeon: Lemar Lofty., MD;  Location: [facility] ENDOSCOPY;  Service: Gastroenterology;  Laterality: N/A;   I & D EXTREMITY Right 12/15/2018   Procedure: DEBRIDEMENT RIGHT FOOT;  Surgeon: Nadara Mustard, MD;  Location: [facility] OR;  Service: Orthopedics;  Laterality: Right;   I & D EXTREMITY Right  08/27/2019   Procedure: PARTIAL CUBOID EXCISION RIGHT FOOT;  Surgeon: Nadara Mustard, MD;  Location: [facility] OR;  Service: Orthopedics;  Laterality: Right;   I & D EXTREMITY Right 01/07/2020   Procedure: RIGHT FOOT EXCISION INFECTED BONE;  Surgeon: Nadara Mustard, MD;  Location: [facility] OR;  Service: Orthopedics;  Laterality: Right;   I & D EXTREMITY Right 05/17/2022   Procedure: IRRIGATION AND DEBRIDEMENT RIGHT FOOT ABSCESS;  Surgeon: Nadara Mustard, MD;  Location: [facility] OR;  Service: Orthopedics;  Laterality: Right;   IR PARACENTESIS  10/11/2022   NECK SURGERY     novemver 2019   POLYPECTOMY  06/22/2022   Procedure: POLYPECTOMY;  Surgeon: Meridee Score Netty Starring., MD;  Location: [facility] ENDOSCOPY;  Service: Gastroenterology;;   POLYPECTOMY  06/23/2022   Procedure: POLYPECTOMY;  Surgeon: Sherrilyn Rist, MD;  Location: [facility]  ENDOSCOPY;  Service: Gastroenterology;;   STUMP REVISION Left 06/27/2021   Procedure: REVISION LEFT BELOW KNEE AMPUTATION;  Surgeon: Nadara Mustard, MD;  Location: [facility] OR;  Service: Orthopedics;  Laterality: Left;   SUBMUCOSAL LIFTING INJECTION  06/22/2022   Procedure: SUBMUCOSAL LIFTING INJECTION;  Surgeon: Meridee Score Netty Starring., MD;  Location: [facility] ENDOSCOPY;  Service: Gastroenterology;;   SUBMUCOSAL TATTOO INJECTION  06/22/2022   Procedure: SUBMUCOSAL TATTOO INJECTION;  Surgeon: Lemar Lofty., MD;  Location: [facility] ENDOSCOPY;  Service: Gastroenterology;;   TRANSURETHRAL RESECTION OF PROSTATE N/A 06/28/2020   Procedure: TRANSURETHRAL RESECTION OF THE PROSTATE (TURP);  Surgeon: Marcine Matar, MD;  Location: [facility] OR;  Service: Urology;  Laterality: N/A;   WISDOM TOOTH EXTRACTION      Social History [name]  reports that he quit smoking about 33 years ago. His smoking use included cigars. He started smoking about 49 years ago. He has never used smokeless tobacco. He reports that he does not drink alcohol and does not use drugs.  family history includes CAD in his father; Cancer in his father; Diabetes Mellitus II in his brother, brother, father, and mother; Kidney disease in his brother, brother, father, and mother; Stomach cancer (age of onset: 19) in his brother.  Allergies  Allergen Reactions   Morphine     Other reaction(s): Delusions (intolerance)   Mushroom Extract Complex Nausea Only       PHYSICAL EXAMINATION: Vital signs: BP (!) 116/58   Pulse (!) 56   Ht 5\' 10"  (1.778 m)   Wt 185 lb (83.9 kg) Comment: unable to weigh  BMI 26.54 kg/m   Constitutional: Pleasant, chronically ill-appearing, no acute distress.  In wheelchair Psychiatric: alert and oriented x3, cooperative Eyes: extraocular movements intact, anicteric, conjunctiva pink Mouth: oral pharynx moist, no lesions Neck: supple no lymphadenopathy Cardiovascular: heart regular rate and rhythm Lungs: clear to  auscultation bilaterally Abdomen: soft, nontender, nondistended, no obvious ascites, no peritoneal signs, normal bowel sounds, no organomegaly Rectal: Omitted Extremities: Bilateral BKA with prostheses Skin: no relevant lesions on visible extremities Neuro: No focal deficits. No asterixis.  ASSESSMENT:  1.  Ascites.  I do not see any convincing evidence for clinically significant liver disease.  No objective evidence for portal hypertension.  Fluid analysis of ascites mitigates against portal hypertension as etiology.  Based on analysis, I suspect that there are cardiac and/or renal contributions accounting for his ascites.  He is being evaluated by cardiology regarding pulm function.  If this is abnormal, it may be optimized.  As well, his neurologist may assess his current dialysis regimen, to see if it is adequate.  From a  GI standpoint, nothing to offer in terms of standard medical therapies.  He is interested in having a repeat paracentesis.   PLAN:  1.  Therapeutic paracentesis. 2.  Fluid for cell count with differential 3.  Follow-up with cardiology regarding possible cardiac cause for ascites 4.  Follow-up with nephrology regarding possible renal cause for ascites 5.  Is supervising physician (likely nephrology) can order for this patient as needed paracenteses for relief of discomfort. 6.  No planned GI follow-up at this time. Total time of 45 minutes was spent preparing to see the patient, obtaining comprehensive history, performing medically appropriate physical exam, counseling educating patient and his wife regarding the above listed issues, ordering therapeutic paracentesis, and documenting clinical information in the health record.

## 2022-11-05 ENCOUNTER — Ambulatory Visit (HOSPITAL_COMMUNITY)
Admission: RE | Admit: 2022-11-05 | Discharge: 2022-11-05 | Disposition: A | Discharge status: Home / Self Care | Payer: Medicare Other | Source: Ambulatory Visit | Attending: Internal Medicine | Admitting: Internal Medicine

## 2022-11-05 DIAGNOSIS — R188 Other ascites: Secondary | ICD-10-CM | POA: Diagnosis present

## 2022-11-05 LAB — BODY FLUID CELL COUNT WITH DIFFERENTIAL
Eos, Fluid: 0 %
Lymphs, Fluid: 56 %
Monocyte-Macrophage-Serous Fluid: 39 % — ABNORMAL LOW (ref 50–90)
Neutrophil Count, Fluid: 5 % (ref 0–25)
Total Nucleated Cell Count, Fluid: 173 cu mm (ref 0–1000)

## 2022-11-05 MED ORDER — LIDOCAINE HCL 1 % IJ SOLN
INTRAMUSCULAR | Status: AC
  Filled 2022-11-05: qty 20

## 2022-11-05 NOTE — Procedures (Signed)
PROCEDURE SUMMARY:  Successful image-guided paracentesis from the left lower abdomen.  Yielded 2.6 liters of dark yellow fluid.  No immediate complications.  EBL = trace. Patient tolerated well.   Specimen was sent for labs.  Please see imaging section of Epic for full dictation.   Kennieth Francois PA-C 11/05/2022 2:32 PM

## 2022-11-06 ENCOUNTER — Ambulatory Visit (INDEPENDENT_AMBULATORY_CARE_PROVIDER_SITE_OTHER): Payer: Medicare Other | Admitting: Physical Therapy

## 2022-11-06 ENCOUNTER — Encounter: Payer: Self-pay | Admitting: Physical Therapy

## 2022-11-06 ENCOUNTER — Other Ambulatory Visit: Payer: Self-pay

## 2022-11-06 DIAGNOSIS — Z7409 Other reduced mobility: Secondary | ICD-10-CM

## 2022-11-06 DIAGNOSIS — Z9181 History of falling: Secondary | ICD-10-CM

## 2022-11-06 DIAGNOSIS — R2681 Unsteadiness on feet: Secondary | ICD-10-CM | POA: Diagnosis not present

## 2022-11-06 DIAGNOSIS — M6281 Muscle weakness (generalized): Secondary | ICD-10-CM

## 2022-11-06 DIAGNOSIS — L98498 Non-pressure chronic ulcer of skin of other sites with other specified severity: Secondary | ICD-10-CM

## 2022-11-06 DIAGNOSIS — R293 Abnormal posture: Secondary | ICD-10-CM

## 2022-11-06 DIAGNOSIS — R2689 Other abnormalities of gait and mobility: Secondary | ICD-10-CM | POA: Diagnosis not present

## 2022-11-06 NOTE — Therapy (Signed)
OUTPATIENT PHYSICAL THERAPY PROSTHETICS EVALUATION   Patient Name: [name] MRN: [id number] DOB:1951-01-09, 72 y.o., male Today's Date: 11/06/2022  PCP: Adolph Pollack, FNP REFERRING PROVIDER: Aldean Baker, MD  END OF SESSION:  PT End of Session - 11/06/22 1620     Visit Number 1    Number of Visits 25    Date for PT Re-Evaluation 01/30/23    Authorization Type Medicare & BCBS supplement    Progress Note Due on Visit 10    PT Start Time 1430    PT Stop Time 1519    PT Time Calculation (min) 49 min    Equipment Utilized During Treatment Gait belt    Activity Tolerance Patient tolerated treatment well    Behavior During Therapy WFL for tasks assessed/performed             Past Medical History:  Diagnosis Date   Acute osteomyelitis of metatarsal bone of left foot (HCC)    Ambulates with cane    straight cane   Anemia    Cervical myelopathy (HCC) 02/06/2018   Chronic kidney disease    dailysis M W F- home   Community acquired pneumonia of left lower lobe of lung 08/18/2021   Complication of anesthesia    Coronary artery disease    Dehiscence of amputation stump of left lower extremity (HCC)    Diabetes mellitus without complication (HCC)    type2   Diabetic foot ulcer (HCC)    Diabetic neuropathy (HCC) 02/06/2018   Dysrhythmia    Gait abnormality 08/24/2018   GERD (gastroesophageal reflux disease)     01/06/20- not current   Gout    History of blood transfusion    History of blood transfusion    History of kidney stones    passed stones   Hypercholesteremia    Hypertension    Hypothyroidism    Neuromuscular disorder (HCC)    neuropathy left leg and bilateral feet   Neuropathy    Partial nontraumatic amputation of foot, 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   PONV (postoperative nausea and vomiting)    Prostate cancer (HCC)    PVD (peripheral vascular disease) (HCC)    with amputations   Past Surgical History:  Procedure Laterality Date   A-FLUTTER ABLATION  N/A 04/06/2020   Procedure: A-FLUTTER ABLATION;  Surgeon: Marinus Maw, MD;  Location: [facility];  Service: Cardiovascular;  Laterality: N/A;   AMPUTATION Left 12/25/2013   Procedure: AMPUTATION RAY LEFT 5TH RAY;  Surgeon: Nadara Mustard, MD;  Location: [facility] ORS;  Service: Orthopedics;  Laterality: Left;   AMPUTATION Right 12/15/2018   Procedure: AMPUTATION OF 4TH AND 5TH TOES RIGHT FOOT;  Surgeon: Nadara Mustard, MD;  Location: [facility] OR;  Service: Orthopedics;  Laterality: Right;   AMPUTATION Left 02/02/2021   Procedure: LEFT FOOT 4TH RAY AMPUTATION;  Surgeon: Nadara Mustard, MD;  Location: [facility] OR;  Service: Orthopedics;  Laterality: Left;   AMPUTATION Left 05/09/2021   Procedure: LEFT BELOW KNEE AMPUTATION;  Surgeon: Nadara Mustard, MD;  Location: [facility] OR;  Service: Orthopedics;  Laterality: Left;   AMPUTATION Right 07/26/2022   Procedure: RIGHT BELOW KNEE AMPUTATION;  Surgeon: Nadara Mustard, MD;  Location: [facility] OR;  Service: Orthopedics;  Laterality: Right;   APPLICATION OF WOUND VAC Right 12/15/2018   Procedure: APPLICATION OF WOUND VAC;  Surgeon: Nadara Mustard, MD;  Location: [facility] OR;  Service: Orthopedics;  Laterality: Right;   AV FISTULA PLACEMENT Left 03/25/2019  Procedure: LEFT ARM ARTERIOVENOUS (AV) FISTULA CREATION;  Surgeon: Nada Libman, MD;  Location: [facility] OR;  Service: Vascular;  Laterality: Left;   BACK SURGERY     BASCILIC VEIN TRANSPOSITION Left 05/20/2019   Procedure: SECOND STAGE LEFT BASCILIC VEIN TRANSPOSITION;  Surgeon: Nada Libman, MD;  Location: [facility] OR;  Service: Vascular;  Laterality: Left;   BIOPSY  06/22/2022   Procedure: BIOPSY;  Surgeon: Lemar Lofty., MD;  Location: [facility] ENDOSCOPY;  Service: Gastroenterology;;   CARDIAC CATHETERIZATION  02/17/2014   CHOLECYSTECTOMY     COLONOSCOPY  2011   in [place], Normal   COLONOSCOPY N/A 06/23/2022   Procedure: COLONOSCOPY;  Surgeon: Sherrilyn Rist, MD;  Location: [facility] ENDOSCOPY;  Service: Gastroenterology;   Laterality: N/A;   CORONARY ARTERY BYPASS GRAFT  2008   CYSTOSCOPY N/A 06/29/2020   Procedure: CYSTOSCOPY WITH CLOT EVACUATION AND  FULGERATION;  Surgeon: Marcine Matar, MD;  Location: [facility] [place] [facility] OR;  Service: Urology;  Laterality: N/A;   CYSTOSCOPY WITH FULGERATION Bilateral 06/17/2020   Procedure: CYSTOSCOPY,BILATERAL RETROGRADE, CLOT EVACUATION WITH FULGERATION OF THE BLADDER;  Surgeon: Jannifer Hick, MD;  Location: [facility] OR;  Service: Urology;  Laterality: Bilateral;   CYSTOSCOPY WITH FULGERATION N/A 06/28/2020   Procedure: CYSTOSCOPY WITH CLOT EVACUATION AND FULGERATION OF BLEEDERS;  Surgeon: Marcine Matar, MD;  Location: [facility] OR;  Service: Urology;  Laterality: N/A;  1 HR   ENTEROSCOPY N/A 06/22/2022   Procedure: ENTEROSCOPY;  Surgeon: Meridee Score Netty Starring., MD;  Location: [facility] ENDOSCOPY;  Service: Gastroenterology;  Laterality: N/A;   HEMOSTASIS CLIP PLACEMENT  06/22/2022   Procedure: HEMOSTASIS CLIP PLACEMENT;  Surgeon: Lemar Lofty., MD;  Location: [facility] ENDOSCOPY;  Service: Gastroenterology;;   HOT HEMOSTASIS N/A 06/22/2022   Procedure: HOT HEMOSTASIS (ARGON PLASMA COAGULATION/BICAP);  Surgeon: Lemar Lofty., MD;  Location: [facility] ENDOSCOPY;  Service: Gastroenterology;  Laterality: N/A;   I & D EXTREMITY Right 12/15/2018   Procedure: DEBRIDEMENT RIGHT FOOT;  Surgeon: Nadara Mustard, MD;  Location: [facility] OR;  Service: Orthopedics;  Laterality: Right;   I & D EXTREMITY Right 08/27/2019   Procedure: PARTIAL CUBOID EXCISION RIGHT FOOT;  Surgeon: Nadara Mustard, MD;  Location: [facility] OR;  Service: Orthopedics;  Laterality: Right;   I & D EXTREMITY Right 01/07/2020   Procedure: RIGHT FOOT EXCISION INFECTED BONE;  Surgeon: Nadara Mustard, MD;  Location: [facility] OR;  Service: Orthopedics;  Laterality: Right;   I & D EXTREMITY Right 05/17/2022   Procedure: IRRIGATION AND DEBRIDEMENT RIGHT FOOT ABSCESS;  Surgeon: Nadara Mustard, MD;  Location: [facility] OR;  Service: Orthopedics;  Laterality: Right;   IR  PARACENTESIS  10/11/2022   NECK SURGERY     novemver 2019   POLYPECTOMY  06/22/2022   Procedure: POLYPECTOMY;  Surgeon: Meridee Score Netty Starring., MD;  Location: [facility] ENDOSCOPY;  Service: Gastroenterology;;   POLYPECTOMY  06/23/2022   Procedure: POLYPECTOMY;  Surgeon: Sherrilyn Rist, MD;  Location: [facility] ENDOSCOPY;  Service: Gastroenterology;;   STUMP REVISION Left 06/27/2021   Procedure: REVISION LEFT BELOW KNEE AMPUTATION;  Surgeon: Nadara Mustard, MD;  Location: [facility] OR;  Service: Orthopedics;  Laterality: Left;   SUBMUCOSAL LIFTING INJECTION  06/22/2022   Procedure: SUBMUCOSAL LIFTING INJECTION;  Surgeon: Meridee Score Netty Starring., MD;  Location: [facility] ENDOSCOPY;  Service: Gastroenterology;;   SUBMUCOSAL TATTOO INJECTION  06/22/2022   Procedure: SUBMUCOSAL TATTOO INJECTION;  Surgeon: Lemar Lofty., MD;  Location: [facility] ENDOSCOPY;  Service: Gastroenterology;;   TRANSURETHRAL RESECTION OF PROSTATE N/A  06/28/2020   Procedure: TRANSURETHRAL RESECTION OF THE PROSTATE (TURP);  Surgeon: Marcine Matar, MD;  Location: [facility] OR;  Service: Urology;  Laterality: N/A;   WISDOM TOOTH EXTRACTION     Patient Active Problem List   Diagnosis Date Noted   Right below-knee amputee (HCC) 07/29/2022   Severe sepsis (HCC) 07/23/2022   Foot infection 07/22/2022   Acute respiratory failure (HCC) 07/22/2022   Anemia due to chronic blood loss 06/23/2022   Heme positive stool 06/23/2022   Benign neoplasm of cecum 06/23/2022   Benign neoplasm of transverse colon 06/23/2022   Benign neoplasm of descending colon 06/23/2022   Abscess of right foot 05/17/2022   Contraindication to anticoagulation therapy GU Bleed 03/13/2022   Paroxysmal atrial fibrillation (HCC) 08/18/2021   Anemia of chronic renal failure 08/18/2021   Thrombocytopenia (HCC) 08/18/2021   Acute metabolic encephalopathy 08/18/2021   Action induced myoclonus 08/18/2021   Left below-knee amputee (HCC) 05/11/2021   Symptomatic anemia 09/13/2020   COVID-19  virus infection 09/13/2020   Pressure injury of skin 06/29/2020   Gross hematuria 06/16/2020   Typical atrial flutter (HCC) 03/24/2020   Bilateral pleural effusion 02/24/2020   Healthcare maintenance 01/28/2020   Shortness of breath 01/28/2020   History of partial ray amputation of fourth toe of right foot (HCC) 09/08/2019   Chronic cough 06/25/2019   Cutaneous abscess of right foot    Subacute osteomyelitis, right ankle and foot (HCC) 12/14/2018   AKI (acute kidney injury) (HCC) 12/14/2018   ESRD on dialysis (HCC) 12/14/2018   S/P CABG (coronary artery bypass graft) 12/11/2018   Gait abnormality 08/24/2018   Diabetic neuropathy (HCC) 02/06/2018   Cervical myelopathy (HCC) 02/06/2018   Onychomycosis 10/30/2017   Other spondylosis with radiculopathy, cervical region 01/27/2017   Midfoot ulcer, right, limited to breakdown of skin (HCC) 11/15/2016   Lateral epicondylitis, left elbow 08/12/2016   Prostate cancer (HCC) 06/19/2016   Cellulitis of right foot 12/24/2015   Gout 09/14/2012   Hypertension associated with diabetes (HCC) 09/14/2012   Hypothyroidism 09/14/2012   CAD (coronary artery disease) 09/14/2012   Insulin dependent type 2 diabetes mellitus (HCC) 09/14/2012   Hyperlipidemia associated with type 2 diabetes mellitus (HCC)    Neuropathy (HCC)     ONSET DATE: 10/31/2022 right BKA prosthesis delivery  REFERRING DIAG:  Z56.387F (ICD-10-CM) - Below-knee amputation of left lower extremity, initial encounter (HCC)  I43.329J (ICD-10-CM) - Below-knee amputation of right lower extremity, initial encounter (HCC)   THERAPY DIAG:  Unsteadiness on feet  Other abnormalities of gait and mobility  Muscle weakness (generalized)  Abnormal posture  Non-pressure chronic ulcer of skin of other sites with other specified severity (HCC)  History of falling  Impaired functional mobility, balance, gait, and endurance  Rationale for Evaluation and Treatment:  Rehabilitation  SUBJECTIVE:   SUBJECTIVE STATEMENT: This 72yo male underwent a right Below Knee Amputation on 07/26/2022 due to infected wound. He was admitted to hospital on 07/23/2022 with acute encephalopathy & acute respiratory failure. He has history of left Below Knee Amputation 05/09/2021. He had left prosthesis with ratchet socket for >6 months and liked it better.  He got right prosthesis on 10/31/2022 with same socket design.  He has worn it daily most of awake hours with no issues with skin. Pt accompanied by: significant other and CNA  PERTINENT HISTORY: ESRD on home dialysis, HTN, HLD, CAD, DM2, PVD, prostate CA, cervical myelopathy,    PAIN:  Are you having pain? No  PRECAUTIONS: Fall and Other: No  BP LUE  WEIGHT BEARING RESTRICTIONS: No  FALLS: Has patient fallen in last 6 months? Yes. Number of falls 1 his left prosthesis slipped off & denies injuries.   LIVING ENVIRONMENT: Lives with: lives with their spouse and CNA Mon-Fri 1:00-5:00 Lives in: House  Home Access: Ramped entrance Home layout:  3 level enters on middle floor which has bedroom & full bath. Stairs: downstairs has chair lift.  Has following equipment at home: Single point cane, Walker - 2 wheeled, Environmental consultant - 4 wheeled, Wheelchair (manual), [employer], Grab bars, and Ramped entry  OCCUPATION: retired  PLOF: he was walking with RW with left prosthesis.    PATIENT GOALS:   to use 2 prostheses to walk with cane. He wants to bring garbage cans up sloped driveway.    OBJECTIVE:  COGNITION: Eval on 11/06/2022:  Overall cognitive status: Within functional limits for tasks assessed  POSTURE: Eval on 11/06/2022: rounded shoulders, forward head, flexed trunk , and weight shift left  LOWER EXTREMITY ROM:  ROM P:passive  A:active Right eval Left eval  Hip flexion    Hip extension    Hip abduction    Hip adduction    Hip internal rotation    Hip external rotation    Knee flexion    Knee extension seated P:  -6*   Ankle dorsiflexion    Ankle plantarflexion    Ankle inversion    Ankle eversion     (Blank rows = not tested)  LOWER EXTREMITY MMT:  MMT Right eval Left eval  Hip flexion 4/5 4/5  Hip extension 3/5 3+5  Hip abduction 35 3+5  Hip adduction    Hip internal rotation    Hip external rotation    Knee flexion 3+/5 4/5  Knee extension 3+/5 4/5  Eval MMT gross seated & functional  (Blank rows = not tested)  TRANSFERS: Eval on 11/06/2022: Sit to stand: SBA 22" w/c using BUEs on armrests requires RW for stabilization Stand to sit: SBA RW for stabilization to 22" w/c using BUEs on armrests  GAIT: Eval on 11/06/2022: Gait pattern: step to pattern, decreased step length- Left, decreased stance time- Right, Right hip hike, Left hip hike, knee flexed in stance- Right, lateral hip instability, trunk flexed, and wide BOS Distance walked: 25' turning 180* CW & CCW Assistive device utilized: Environmental consultant - 2 wheeled and bilateral TTA prostheses Level of assistance: Min A  FUNCTIONAL TESTs:  Eval on 11/06/2022: Standing balance with RW support with close supervision: reaches 5" anteriorly, looks to side only when looking over shoulders and reaches towards floor within 10".    CURRENT PROSTHETIC WEAR ASSESSMENT: 11/06/2022:  Patient is independent with: prosthetic cleaning and ply sock cleaning Patient is dependent with: skin check, residual limb care, proper wear schedule/adjustment, and proper weight-bearing schedule/adjustment Donning prosthesis: SBA / verbal cues Doffing prosthesis: Modified independence Prosthetic wear tolerance: pt reports wearing left prosthesis daily all awake hours for several months.  Right prosthesis has worn daily for 6 days since receiving it for most of awake hours.  Prosthetic weight bearing tolerance: 5 minutes with no c/o pain Edema: right limb has pitting edema with 5 sec capillary refill.  Left limb has no edema noted. Residual limb condition: Left limb has  superficial wound 5mm with white perimeter from excessive fluid & CNA reports some drainage;  Right limb has no open areas, distal area has redness & shiny skin, normal temperature, no hair growth.  Prosthetic description: silicon liners (9mm over tibia &  6mm posterior portion) with pin lock suspension, socket with ratchet suspension, flexible Keel feet     TODAY'S TREATMENT:                                                                                                                             DATE:  11/06/2022:  PATIENT EDUCATION: PATIENT EDUCATED ON FOLLOWING PROSTHETIC CARE: Education details:   Skin check, Residual limb care, Propper donning, and Proper wear schedule/adjustment Prosthetic wear tolerance: right prosthesis 4 hours on, 1 hour off rotating during awake hours including removing liner so limb gets air.   Person educated: Patient, Spouse, and Caregiver CNA Education method: Explanation, Demonstration, and Verbal cues Education comprehension: verbalized understanding, verbal cues required, and needs further education  HOME EXERCISE PROGRAM:  ASSESSMENT:  CLINICAL IMPRESSION: Patient is a 72 y.o. male who was seen today for physical therapy evaluation and treatment for prosthetic training with bilateral Transtibial Amputations.  Patient requires skilled instruction for prosthetic care.  Bilateral lower extremities have skin care concerns that need monitoring.  Patient has decreased strength bilateral lower extremities impairing functioning.  Patient's balance is impaired requiring upper extremity support and close supervision.  Patient's gait is dependent on bilateral upper extremity support of rolling walker and PT assistance for safety.  Patient would benefit from skilled PT to improve function and safety utilizing bilateral transtibial prostheses.  OBJECTIVE IMPAIRMENTS: Abnormal gait, decreased activity tolerance, decreased balance, decreased endurance, decreased knowledge  of use of DME, decreased mobility, difficulty walking, decreased ROM, decreased strength, increased edema, postural dysfunction, and prosthetic dependency .   ACTIVITY LIMITATIONS: carrying, standing, stairs, transfers, locomotion level, and standing ADLs  PARTICIPATION LIMITATIONS: community activity and household mobility  PERSONAL FACTORS: Age, Fitness, Past/current experiences, Time since onset of injury/illness/exacerbation, and 3+ comorbidities: see PMH  are also affecting patient's functional outcome.   REHAB POTENTIAL: Good  CLINICAL DECISION MAKING: Evolving/moderate complexity  EVALUATION COMPLEXITY: Moderate   GOALS: Goals reviewed with patient? Yes  SHORT TERM GOALS: Target date: 12/05/2022  Patient verbalizes proper skin care & checking limbs.  Baseline: SEE OBJECTIVE DATA Goal status: INITIAL 2.  Patient tolerates prostheses >12 hrs total /day without skin issues or limb pain Baseline: SEE OBJECTIVE DATA Goal status: INITIAL  3.  Patient able to reach 10" and look over both shoulders with single UE support on RW safely. Baseline: SEE OBJECTIVE DATA Goal status: INITIAL  4. Patient ambulates 200' with RW & prostheses with supervision. Baseline: SEE OBJECTIVE DATA Goal status: INITIAL  5. Patient negotiates ramps & curbs with RW & prostheses with minA. Baseline: SEE OBJECTIVE DATA Goal status: INITIAL  LONG TERM GOALS: Target date: 01/30/2023  Patient demonstrates & verbalized understanding of prosthetic care to enable safe utilization of prosthesis. Baseline: SEE OBJECTIVE DATA Goal status: INITIAL  Patient tolerates prostheses wear >90% of awake hours without skin or limb pain issues. Baseline: SEE OBJECTIVE DATA Goal status: INITIAL  Tasks of Berg Balance with RW support >/=  30/56 Baseline: SEE OBJECTIVE DATA Goal status: INITIAL  Patient ambulates >300' with prosthesis & LRAD independently Baseline: SEE OBJECTIVE DATA Goal status:  INITIAL  Patient negotiates ramps, curbs & stairs with single rail with prostheses & LRAD modified independent. Baseline: SEE OBJECTIVE DATA Goal status: INITIAL  Patient verbalizes & demonstrates understanding of how to properly use fitness equipment to return to gym. Baseline: SEE OBJECTIVE DATA Goal status: INITIAL  PLAN:  PT FREQUENCY: 2x/week  PT DURATION: 12 weeks  PLANNED INTERVENTIONS: Therapeutic exercises, Therapeutic activity, Neuromuscular re-education, Balance training, Gait training, Patient/Family education, Self Care, Stair training, Prosthetic training, DME instructions, Aquatic Therapy, Re-evaluation, and physical performance testing  PLAN FOR NEXT SESSION: check residual limbs and wear, set up HEP for balance, prosthetic gait with rollator walker.   Vladimir Faster, PT, DPT 11/06/2022, 4:48 PM

## 2022-11-08 ENCOUNTER — Ambulatory Visit: Payer: Medicare Other

## 2022-11-08 DIAGNOSIS — I2510 Atherosclerotic heart disease of native coronary artery without angina pectoris: Secondary | ICD-10-CM

## 2022-11-08 DIAGNOSIS — I5032 Chronic diastolic (congestive) heart failure: Secondary | ICD-10-CM

## 2022-11-12 NOTE — Progress Notes (Signed)
Echocardiogram 11/08/2022: Normal LV systolic function with visual EF 60-65%. Left ventricle cavity is normal in size. Normal global wall motion. Mild left ventricular hypertrophy. Unable to evaluate diastolic function due to atrial fibrillation.  Left atrial cavity is moderately dilated at 46.76 ml/m^2. Aortic valve sclerosis without stenosis. Native mitral valve. Mitral annular calcification. Mild (Grade I) mitral regurgitation. Moderate tricuspid regurgitation. Mild pulmonary hypertension. RVSP measures 39 mmHg. Mild pulmonic regurgitation. IVC is dilated with a respiratory response of >50%. Estimated right atrial pressure . Compared to 03/17/2020: LVEF improved from 40-45% to 60-65%, RWMA's not appreciated on current study, severe LAE is now moderate, mild PHTN is new w/ RVSP 39 mmHG.

## 2022-11-13 ENCOUNTER — Ambulatory Visit (INDEPENDENT_AMBULATORY_CARE_PROVIDER_SITE_OTHER): Payer: Medicare Other | Admitting: Physical Therapy

## 2022-11-13 ENCOUNTER — Encounter: Payer: Self-pay | Admitting: Physical Therapy

## 2022-11-13 DIAGNOSIS — R293 Abnormal posture: Secondary | ICD-10-CM | POA: Diagnosis not present

## 2022-11-13 DIAGNOSIS — L98498 Non-pressure chronic ulcer of skin of other sites with other specified severity: Secondary | ICD-10-CM

## 2022-11-13 DIAGNOSIS — M6281 Muscle weakness (generalized): Secondary | ICD-10-CM | POA: Diagnosis not present

## 2022-11-13 DIAGNOSIS — R2689 Other abnormalities of gait and mobility: Secondary | ICD-10-CM | POA: Diagnosis not present

## 2022-11-13 DIAGNOSIS — R2681 Unsteadiness on feet: Secondary | ICD-10-CM

## 2022-11-13 NOTE — Therapy (Signed)
OUTPATIENT PHYSICAL THERAPY PROSTHETIC TREATMENT   Patient Name: [name] MRN: [id number] DOB:March 18, 1951, 72 y.o., male Today's Date: 11/13/2022  PCP: Adolph Pollack, FNP REFERRING PROVIDER: Aldean Baker, MD  END OF SESSION:  PT End of Session - 11/13/22 1432     Visit Number 2    Number of Visits 25    Date for PT Re-Evaluation 01/30/23    Authorization Type Medicare & BCBS supplement    Progress Note Due on Visit 10    PT Start Time 1430    PT Stop Time 1515    PT Time Calculation (min) 45 min    Equipment Utilized During Treatment Gait belt    Activity Tolerance Patient tolerated treatment well    Behavior During Therapy WFL for tasks assessed/performed              Past Medical History:  Diagnosis Date   Acute osteomyelitis of metatarsal bone of left foot (HCC)    Ambulates with cane    straight cane   Anemia    Cervical myelopathy (HCC) 02/06/2018   Chronic kidney disease    dailysis M W F- home   Community acquired pneumonia of left lower lobe of lung 08/18/2021   Complication of anesthesia    Coronary artery disease    Dehiscence of amputation stump of left lower extremity (HCC)    Diabetes mellitus without complication (HCC)    type2   Diabetic foot ulcer (HCC)    Diabetic neuropathy (HCC) 02/06/2018   Dysrhythmia    Gait abnormality 08/24/2018   GERD (gastroesophageal reflux disease)     01/06/20- not current   Gout    History of blood transfusion    History of blood transfusion    History of kidney stones    passed stones   Hypercholesteremia    Hypertension    Hypothyroidism    Neuromuscular disorder (HCC)    neuropathy left leg and bilateral feet   Neuropathy    Partial nontraumatic amputation of foot, 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   PONV (postoperative nausea and vomiting)    Prostate cancer (HCC)    PVD (peripheral vascular disease) (HCC)    with amputations   Past Surgical History:  Procedure Laterality Date   A-FLUTTER ABLATION N/A  04/06/2020   Procedure: A-FLUTTER ABLATION;  Surgeon: Marinus Maw, MD;  Location: [facility];  Service: Cardiovascular;  Laterality: N/A;   AMPUTATION Left 12/25/2013   Procedure: AMPUTATION RAY LEFT 5TH RAY;  Surgeon: Nadara Mustard, MD;  Location: [facility] ORS;  Service: Orthopedics;  Laterality: Left;   AMPUTATION Right 12/15/2018   Procedure: AMPUTATION OF 4TH AND 5TH TOES RIGHT FOOT;  Surgeon: Nadara Mustard, MD;  Location: [facility] OR;  Service: Orthopedics;  Laterality: Right;   AMPUTATION Left 02/02/2021   Procedure: LEFT FOOT 4TH RAY AMPUTATION;  Surgeon: Nadara Mustard, MD;  Location: [facility] OR;  Service: Orthopedics;  Laterality: Left;   AMPUTATION Left 05/09/2021   Procedure: LEFT BELOW KNEE AMPUTATION;  Surgeon: Nadara Mustard, MD;  Location: [facility] OR;  Service: Orthopedics;  Laterality: Left;   AMPUTATION Right 07/26/2022   Procedure: RIGHT BELOW KNEE AMPUTATION;  Surgeon: Nadara Mustard, MD;  Location: [facility] OR;  Service: Orthopedics;  Laterality: Right;   APPLICATION OF WOUND VAC Right 12/15/2018   Procedure: APPLICATION OF WOUND VAC;  Surgeon: Nadara Mustard, MD;  Location: [facility] OR;  Service: Orthopedics;  Laterality: Right;   AV FISTULA PLACEMENT Left 03/25/2019  Procedure: LEFT ARM ARTERIOVENOUS (AV) FISTULA CREATION;  Surgeon: Nada Libman, MD;  Location: [facility] OR;  Service: Vascular;  Laterality: Left;   BACK SURGERY     BASCILIC VEIN TRANSPOSITION Left 05/20/2019   Procedure: SECOND STAGE LEFT BASCILIC VEIN TRANSPOSITION;  Surgeon: Nada Libman, MD;  Location: [facility] OR;  Service: Vascular;  Laterality: Left;   BIOPSY  06/22/2022   Procedure: BIOPSY;  Surgeon: Lemar Lofty., MD;  Location: [facility] ENDOSCOPY;  Service: Gastroenterology;;   CARDIAC CATHETERIZATION  02/17/2014   CHOLECYSTECTOMY     COLONOSCOPY  2011   in [place], Normal   COLONOSCOPY N/A 06/23/2022   Procedure: COLONOSCOPY;  Surgeon: Sherrilyn Rist, MD;  Location: [facility]OSCOPY;  Service: Gastroenterology;   Laterality: N/A;   CORONARY ARTERY BYPASS GRAFT  2008   CYSTOSCOPY N/A 06/29/2020   Procedure: CYSTOSCOPY WITH CLOT EVACUATION AND  FULGERATION;  Surgeon: Marcine Matar, MD;  Location: [facility] OR;  Service: Urology;  Laterality: N/A;   CYSTOSCOPY WITH FULGERATION Bilateral 06/17/2020   Procedure: CYSTOSCOPY,BILATERAL RETROGRADE, CLOT EVACUATION WITH FULGERATION OF THE BLADDER;  Surgeon: Jannifer Hick, MD;  Location: [facility] OR;  Service: Urology;  Laterality: Bilateral;   CYSTOSCOPY WITH FULGERATION N/A 06/28/2020   Procedure: CYSTOSCOPY WITH CLOT EVACUATION AND FULGERATION OF BLEEDERS;  Surgeon: Marcine Matar, MD;  Location: [facility] OR;  Service: Urology;  Laterality: N/A;  1 HR   ENTEROSCOPY N/A 06/22/2022   Procedure: ENTEROSCOPY;  Surgeon: Meridee Score Netty Starring., MD;  Location: [facility] ENDOSCOPY;  Service: Gastroenterology;  Laterality: N/A;   HEMOSTASIS CLIP PLACEMENT  06/22/2022   Procedure: HEMOSTASIS CLIP PLACEMENT;  Surgeon: Lemar Lofty., MD;  Location: [facility] ENDOSCOPY;  Service: Gastroenterology;;   HOT HEMOSTASIS N/A 06/22/2022   Procedure: HOT HEMOSTASIS (ARGON PLASMA COAGULATION/BICAP);  Surgeon: Lemar Lofty., MD;  Location: [facility] ENDOSCOPY;  Service: Gastroenterology;  Laterality: N/A;   I & D EXTREMITY Right 12/15/2018   Procedure: DEBRIDEMENT RIGHT FOOT;  Surgeon: Nadara Mustard, MD;  Location: [name] OR;  Service: Orthopedics;  Laterality: Right;   I & D EXTREMITY Right 08/27/2019   Procedure: PARTIAL CUBOID EXCISION RIGHT FOOT;  Surgeon: Nadara Mustard, MD;  Location: [facility] OR;  Service: Orthopedics;  Laterality: Right;   I & D EXTREMITY Right 01/07/2020   Procedure: RIGHT FOOT EXCISION INFECTED BONE;  Surgeon: Nadara Mustard, MD;  Location: [facility] OR;  Service: Orthopedics;  Laterality: Right;   I & D EXTREMITY Right 05/17/2022   Procedure: IRRIGATION AND DEBRIDEMENT RIGHT FOOT ABSCESS;  Surgeon: Nadara Mustard, MD;  Location: [facility] OR;  Service: Orthopedics;  Laterality: Right;   IR  PARACENTESIS  10/11/2022   NECK SURGERY     novemver 2019   POLYPECTOMY  06/22/2022   Procedure: POLYPECTOMY;  Surgeon: Meridee Score Netty Starring., MD;  Location: [facility] ENDOSCOPY;  Service: Gastroenterology;;   POLYPECTOMY  06/23/2022   Procedure: POLYPECTOMY;  Surgeon: Sherrilyn Rist, MD;  Location: [facility] ENDOSCOPY;  Service: Gastroenterology;;   STUMP REVISION Left 06/27/2021   Procedure: REVISION LEFT BELOW KNEE AMPUTATION;  Surgeon: Nadara Mustard, MD;  Location: [facility] OR;  Service: Orthopedics;  Laterality: Left;   SUBMUCOSAL LIFTING INJECTION  06/22/2022   Procedure: SUBMUCOSAL LIFTING INJECTION;  Surgeon: Meridee Score Netty Starring., MD;  Location: [facility] ENDOSCOPY;  Service: Gastroenterology;;   SUBMUCOSAL TATTOO INJECTION  06/22/2022   Procedure: SUBMUCOSAL TATTOO INJECTION;  Surgeon: Lemar Lofty., MD;  Location: [facility] ENDOSCOPY;  Service: Gastroenterology;;   TRANSURETHRAL RESECTION OF PROSTATE N/A  06/28/2020   Procedure: TRANSURETHRAL RESECTION OF THE PROSTATE (TURP);  Surgeon: Marcine Matar, MD;  Location: [facility] OR;  Service: Urology;  Laterality: N/A;   WISDOM TOOTH EXTRACTION     Patient Active Problem List   Diagnosis Date Noted   Right below-knee amputee (HCC) 07/29/2022   Severe sepsis (HCC) 07/23/2022   Foot infection 07/22/2022   Acute respiratory failure (HCC) 07/22/2022   Anemia due to chronic blood loss 06/23/2022   Heme positive stool 06/23/2022   Benign neoplasm of cecum 06/23/2022   Benign neoplasm of transverse colon 06/23/2022   Benign neoplasm of descending colon 06/23/2022   Abscess of right foot 05/17/2022   Contraindication to anticoagulation therapy GU Bleed 03/13/2022   Paroxysmal atrial fibrillation (HCC) 08/18/2021   Anemia of chronic renal failure 08/18/2021   Thrombocytopenia (HCC) 08/18/2021   Acute metabolic encephalopathy 08/18/2021   Action induced myoclonus 08/18/2021   Left below-knee amputee (HCC) 05/11/2021   Symptomatic anemia 09/13/2020   COVID-19  virus infection 09/13/2020   Pressure injury of skin 06/29/2020   Gross hematuria 06/16/2020   Typical atrial flutter (HCC) 03/24/2020   Bilateral pleural effusion 02/24/2020   Healthcare maintenance 01/28/2020   Shortness of breath 01/28/2020   History of partial ray amputation of fourth toe of right foot (HCC) 09/08/2019   Chronic cough 06/25/2019   Cutaneous abscess of right foot    Subacute osteomyelitis, right ankle and foot (HCC) 12/14/2018   AKI (acute kidney injury) (HCC) 12/14/2018   ESRD on dialysis (HCC) 12/14/2018   S/P CABG (coronary artery bypass graft) 12/11/2018   Gait abnormality 08/24/2018   Diabetic neuropathy (HCC) 02/06/2018   Cervical myelopathy (HCC) 02/06/2018   Onychomycosis 10/30/2017   Other spondylosis with radiculopathy, cervical region 01/27/2017   Midfoot ulcer, right, limited to breakdown of skin (HCC) 11/15/2016   Lateral epicondylitis, left elbow 08/12/2016   Prostate cancer (HCC) 06/19/2016   Cellulitis of right foot 12/24/2015   Gout 09/14/2012   Hypertension associated with diabetes (HCC) 09/14/2012   Hypothyroidism 09/14/2012   CAD (coronary artery disease) 09/14/2012   Insulin dependent type 2 diabetes mellitus (HCC) 09/14/2012   Hyperlipidemia associated with type 2 diabetes mellitus (HCC)    Neuropathy (HCC)     ONSET DATE: 10/31/2022 right BKA prosthesis delivery  REFERRING DIAG:  Z61.096E (ICD-10-CM) - Below-knee amputation of left lower extremity, initial encounter (HCC)  A54.098J (ICD-10-CM) - Below-knee amputation of right lower extremity, initial encounter (HCC)   THERAPY DIAG:  Unsteadiness on feet  Other abnormalities of gait and mobility  Muscle weakness (generalized)  Abnormal posture  Non-pressure chronic ulcer of skin of other sites with other specified severity (HCC)  Rationale for Evaluation and Treatment: Rehabilitation  SUBJECTIVE:   SUBJECTIVE STATEMENT: He saw prosthetist who removed a strap from new  prosthesis which helped.  He has been removing 1 hour 2x/ay as PT recommended. He has been walking with rollator walker.  Pt accompanied by: significant other and CNA  PERTINENT HISTORY: ESRD on home dialysis, HTN, HLD, CAD, DM2, PVD, prostate CA, cervical myelopathy,    PAIN:  Are you having pain? No  PRECAUTIONS: Fall and Other: No BP LUE  WEIGHT BEARING RESTRICTIONS: No  FALLS: Has patient fallen in last 6 months? Yes. Number of falls 1 his left prosthesis slipped off & denies injuries.   LIVING ENVIRONMENT: Lives with: lives with their spouse and CNA Mon-Fri 1:00-5:00 Lives in: House  Home Access: Ramped entrance Home layout:  3 level enters on middle  floor which has bedroom & full bath. Stairs: downstairs has chair lift.  Has following equipment at home: Single point cane, Walker - 2 wheeled, Environmental consultant - 4 wheeled, Wheelchair (manual), [employer], Grab bars, and Ramped entry  OCCUPATION: retired  PLOF: he was walking with RW with left prosthesis.    PATIENT GOALS:   to use 2 prostheses to walk with cane. He wants to bring garbage cans up sloped driveway.    OBJECTIVE:  COGNITION: Eval on 11/06/2022:  Overall cognitive status: Within functional limits for tasks assessed  POSTURE: Eval on 11/06/2022: rounded shoulders, forward head, flexed trunk , and weight shift left  LOWER EXTREMITY ROM:  ROM P:passive  A:active Right eval  Hip flexion   Hip extension   Hip abduction   Hip adduction   Hip internal rotation   Hip external rotation   Knee flexion   Knee extension seated P: -6*  Ankle dorsiflexion   Ankle plantarflexion   Ankle inversion   Ankle eversion    (Blank rows = not tested)  LOWER EXTREMITY MMT:  MMT Right eval Left eval  Hip flexion 4/5 4/5  Hip extension 3/5 3+5  Hip abduction 35 3+5  Hip adduction    Hip internal rotation    Hip external rotation    Knee flexion 3+/5 4/5  Knee extension 3+/5 4/5  Eval MMT gross seated & functional   (Blank rows = not tested)  TRANSFERS: Eval on 11/06/2022: Sit to stand: SBA 22" w/c using BUEs on armrests requires RW for stabilization Stand to sit: SBA RW for stabilization to 22" w/c using BUEs on armrests  GAIT: Eval on 11/06/2022: Gait pattern: step to pattern, decreased step length- Left, decreased stance time- Right, Right hip hike, Left hip hike, knee flexed in stance- Right, lateral hip instability, trunk flexed, and wide BOS Distance walked: 25' turning 180* CW & CCW Assistive device utilized: Environmental consultant - 2 wheeled and bilateral TTA prostheses Level of assistance: Min A  FUNCTIONAL TESTs:  Eval on 11/06/2022: Standing balance with RW support with close supervision: reaches 5" anteriorly, looks to side only when looking over shoulders and reaches towards floor within 10".    CURRENT PROSTHETIC WEAR ASSESSMENT: 11/06/2022:  Patient is independent with: prosthetic cleaning and ply sock cleaning Patient is dependent with: skin check, residual limb care, proper wear schedule/adjustment, and proper weight-bearing schedule/adjustment Donning prosthesis: SBA / verbal cues Doffing prosthesis: Modified independence Prosthetic wear tolerance: pt reports wearing left prosthesis daily all awake hours for several months.  Right prosthesis has worn daily for 6 days since receiving it for most of awake hours.  Prosthetic weight bearing tolerance: 5 minutes with no c/o pain Edema: right limb has pitting edema with 5 sec capillary refill.  Left limb has no edema noted. Residual limb condition: Left limb has superficial wound 5mm with white perimeter from excessive fluid & CNA reports some drainage;  Right limb has no open areas, distal area has redness & shiny skin, normal temperature, no hair growth.  Prosthetic description: silicon liners (9mm over tibia & 6mm posterior portion) with pin lock suspension, socket with ratchet suspension, flexible Keel feet     TODAY'S TREATMENT:  DATE:  11/13/2022: Prosthetic Training with Transtibial Prostheses: Pt reports that wound on left leg is healing with use of Tegaderm. He has deep redness on anterior limb medial & lateral to tibia and posterior limb.  He reports itching on limbs and redness appears to be from possible scratching.  Pt recommended rubbing with wet washcloth or dry cloth if warm washcloth not available to decrease scratching. Pt verbalized understanding.  Pt to check out if [employer] gym where he goes has a recumbent stepper. PT explained how can be used to build LE endurance for walking.    Neuromuscular Re-eduction: Seated on 24" bar stool with feet on floor without UE support 5 min and intermittently between exercises. PT cues for upright trunk posture including look forward to decrease head forward.   Sit to / from stand 24" bar stool with one hand on sink 5 reps with ea UE with minA/CGA. Standing with BUE sink support: hip rotation internal to external to neutral to teach how hip vs prosthesis for toe-out.  Pt performed 10 reps ea LE and pt / wife verbalized better understanding.  Standing with BUE sink support: stepping lateral / abduction and back so stance LE under pelvis alternating LEs 10 reps. Standing with BUE sink support: hip hike then stepping posterior / extension and back so stance LE under pelvis alternating LEs 10 reps.   11/06/2022:  PATIENT EDUCATION: PATIENT EDUCATED ON FOLLOWING PROSTHETIC CARE: Education details:   Skin check, Residual limb care, Propper donning, and Proper wear schedule/adjustment Prosthetic wear tolerance: right prosthesis 4 hours on, 1 hour off rotating during awake hours including removing liner so limb gets air.   Person educated: Patient, Spouse, and Caregiver CNA Education method: Explanation, Demonstration, and Verbal cues Education comprehension: verbalized  understanding, verbal cues required, and needs further education  HOME EXERCISE PROGRAM:  ASSESSMENT:  CLINICAL IMPRESSION: PT began instruction in HEP for balance & strength today but will need further education.    OBJECTIVE IMPAIRMENTS: Abnormal gait, decreased activity tolerance, decreased balance, decreased endurance, decreased knowledge of use of DME, decreased mobility, difficulty walking, decreased ROM, decreased strength, increased edema, postural dysfunction, and prosthetic dependency .   ACTIVITY LIMITATIONS: carrying, standing, stairs, transfers, locomotion level, and standing ADLs  PARTICIPATION LIMITATIONS: community activity and household mobility  PERSONAL FACTORS: Age, Fitness, Past/current experiences, Time since onset of injury/illness/exacerbation, and 3+ comorbidities: see PMH  are also affecting patient's functional outcome.   REHAB POTENTIAL: Good  CLINICAL DECISION MAKING: Evolving/moderate complexity  EVALUATION COMPLEXITY: Moderate   GOALS: Goals reviewed with patient? Yes  SHORT TERM GOALS: Target date: 12/05/2022  Patient verbalizes proper skin care & checking limbs.  Baseline: SEE OBJECTIVE DATA Goal status: INITIAL 2.  Patient tolerates prostheses >12 hrs total /day without skin issues or limb pain Baseline: SEE OBJECTIVE DATA Goal status: INITIAL  3.  Patient able to reach 10" and look over both shoulders with single UE support on RW safely. Baseline: SEE OBJECTIVE DATA Goal status: INITIAL  4. Patient ambulates 200' with RW & prostheses with supervision. Baseline: SEE OBJECTIVE DATA Goal status: INITIAL  5. Patient negotiates ramps & curbs with RW & prostheses with minA. Baseline: SEE OBJECTIVE DATA Goal status: INITIAL  LONG TERM GOALS: Target date: 01/30/2023  Patient demonstrates & verbalized understanding of prosthetic care to enable safe utilization of prosthesis. Baseline: SEE OBJECTIVE DATA Goal status: INITIAL  Patient  tolerates prostheses wear >90% of awake hours without skin or limb pain issues. Baseline: SEE OBJECTIVE DATA Goal  status: INITIAL  Tasks of [employer] with RW support >/= 30/56 Baseline: SEE OBJECTIVE DATA Goal status: INITIAL  Patient ambulates >300' with prosthesis & LRAD independently Baseline: SEE OBJECTIVE DATA Goal status: INITIAL  Patient negotiates ramps, curbs & stairs with single rail with prostheses & LRAD modified independent. Baseline: SEE OBJECTIVE DATA Goal status: INITIAL  Patient verbalizes & demonstrates understanding of how to properly use fitness equipment to return to gym. Baseline: SEE OBJECTIVE DATA Goal status: INITIAL  PLAN:  PT FREQUENCY: 2x/week  PT DURATION: 12 weeks  PLANNED INTERVENTIONS: Therapeutic exercises, Therapeutic activity, Neuromuscular re-education, Balance training, Gait training, Patient/Family education, Self Care, Stair training, Prosthetic training, DME instructions, Aquatic Therapy, Re-evaluation, and physical performance testing  PLAN FOR NEXT SESSION: continue instruct in HEP for balance, prosthetic gait with rollator walker.   Vladimir Faster, PT, DPT 11/13/2022, 4:24 PM

## 2022-11-18 ENCOUNTER — Encounter: Payer: Self-pay | Admitting: Physical Therapy

## 2022-11-18 ENCOUNTER — Ambulatory Visit (INDEPENDENT_AMBULATORY_CARE_PROVIDER_SITE_OTHER): Payer: Medicare Other | Admitting: Physical Therapy

## 2022-11-18 DIAGNOSIS — R2689 Other abnormalities of gait and mobility: Secondary | ICD-10-CM | POA: Diagnosis not present

## 2022-11-18 DIAGNOSIS — M6281 Muscle weakness (generalized): Secondary | ICD-10-CM

## 2022-11-18 DIAGNOSIS — R2681 Unsteadiness on feet: Secondary | ICD-10-CM | POA: Diagnosis not present

## 2022-11-18 DIAGNOSIS — R293 Abnormal posture: Secondary | ICD-10-CM

## 2022-11-18 DIAGNOSIS — L98498 Non-pressure chronic ulcer of skin of other sites with other specified severity: Secondary | ICD-10-CM

## 2022-11-18 NOTE — Therapy (Signed)
OUTPATIENT PHYSICAL THERAPY PROSTHETIC TREATMENT   Patient Name: [name] MRN: [id number] DOB:Dec 08, 1950, 72 y.o., male Today's Date: 11/18/2022  PCP: Adolph Pollack, FNP REFERRING PROVIDER: Aldean Baker, MD  END OF SESSION:  PT End of Session - 11/18/22 1049     Visit Number 3    Number of Visits 25    Date for PT Re-Evaluation 01/30/23    Authorization Type Medicare & BCBS supplement    Progress Note Due on Visit 10    PT Start Time 1050    PT Stop Time 1137    PT Time Calculation (min) 47 min    Equipment Utilized During Treatment Gait belt    Activity Tolerance Patient tolerated treatment well    Behavior During Therapy WFL for tasks assessed/performed               Past Medical History:  Diagnosis Date   Acute osteomyelitis of metatarsal bone of left foot (HCC)    Ambulates with cane    straight cane   Anemia    Cervical myelopathy (HCC) 02/06/2018   Chronic kidney disease    dailysis M W F- home   Community acquired pneumonia of left lower lobe of lung 08/18/2021   Complication of anesthesia    Coronary artery disease    Dehiscence of amputation stump of left lower extremity (HCC)    Diabetes mellitus without complication (HCC)    type2   Diabetic foot ulcer (HCC)    Diabetic neuropathy (HCC) 02/06/2018   Dysrhythmia    Gait abnormality 08/24/2018   GERD (gastroesophageal reflux disease)     01/06/20- not current   Gout    History of blood transfusion    History of blood transfusion    History of kidney stones    passed stones   Hypercholesteremia    Hypertension    Hypothyroidism    Neuromuscular disorder (HCC)    neuropathy left leg and bilateral feet   Neuropathy    Partial nontraumatic amputation of foot, 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   PONV (postoperative nausea and vomiting)    Prostate cancer (HCC)    PVD (peripheral vascular disease) (HCC)    with amputations   Past Surgical History:  Procedure Laterality Date   A-FLUTTER ABLATION  N/A 04/06/2020   Procedure: A-FLUTTER ABLATION;  Surgeon: Marinus Maw, MD;  Location: [facility];  Service: Cardiovascular;  Laterality: N/A;   AMPUTATION Left 12/25/2013   Procedure: AMPUTATION RAY LEFT 5TH RAY;  Surgeon: Nadara Mustard, MD;  Location: [facility] ORS;  Service: Orthopedics;  Laterality: Left;   AMPUTATION Right 12/15/2018   Procedure: AMPUTATION OF 4TH AND 5TH TOES RIGHT FOOT;  Surgeon: Nadara Mustard, MD;  Location: [facility] OR;  Service: Orthopedics;  Laterality: Right;   AMPUTATION Left 02/02/2021   Procedure: LEFT FOOT 4TH RAY AMPUTATION;  Surgeon: Nadara Mustard, MD;  Location: [facility] OR;  Service: Orthopedics;  Laterality: Left;   AMPUTATION Left 05/09/2021   Procedure: LEFT BELOW KNEE AMPUTATION;  Surgeon: Nadara Mustard, MD;  Location: [facility] OR;  Service: Orthopedics;  Laterality: Left;   AMPUTATION Right 07/26/2022   Procedure: RIGHT BELOW KNEE AMPUTATION;  Surgeon: Nadara Mustard, MD;  Location: [facility] OR;  Service: Orthopedics;  Laterality: Right;   APPLICATION OF WOUND VAC Right 12/15/2018   Procedure: APPLICATION OF WOUND VAC;  Surgeon: Nadara Mustard, MD;  Location: [facility] OR;  Service: Orthopedics;  Laterality: Right;   AV FISTULA PLACEMENT Left  03/25/2019   Procedure: LEFT ARM ARTERIOVENOUS (AV) FISTULA CREATION;  Surgeon: Nada Libman, MD;  Location: [facility] OR;  Service: Vascular;  Laterality: Left;   BACK SURGERY     BASCILIC VEIN TRANSPOSITION Left 05/20/2019   Procedure: SECOND STAGE LEFT BASCILIC VEIN TRANSPOSITION;  Surgeon: Nada Libman, MD;  Location: [facility] OR;  Service: Vascular;  Laterality: Left;   BIOPSY  06/22/2022   Procedure: BIOPSY;  Surgeon: Lemar Lofty., MD;  Location: [facility] ENDOSCOPY;  Service: Gastroenterology;;   CARDIAC CATHETERIZATION  02/17/2014   CHOLECYSTECTOMY     COLONOSCOPY  2011   in [place], Normal   COLONOSCOPY N/A 06/23/2022   Procedure: COLONOSCOPY;  Surgeon: Sherrilyn Rist, MD;  Location: [facility] ENDOSCOPY;  Service: Gastroenterology;   Laterality: N/A;   CORONARY ARTERY BYPASS GRAFT  2008   CYSTOSCOPY N/A 06/29/2020   Procedure: CYSTOSCOPY WITH CLOT EVACUATION AND  FULGERATION;  Surgeon: Marcine Matar, MD;  Location: [facility] OR;  Service: Urology;  Laterality: N/A;   CYSTOSCOPY WITH FULGERATION Bilateral 06/17/2020   Procedure: CYSTOSCOPY,BILATERAL RETROGRADE, CLOT EVACUATION WITH FULGERATION OF THE BLADDER;  Surgeon: Jannifer Hick, MD;  Location: [facility] OR;  Service: Urology;  Laterality: Bilateral;   CYSTOSCOPY WITH FULGERATION N/A 06/28/2020   Procedure: CYSTOSCOPY WITH CLOT EVACUATION AND FULGERATION OF BLEEDERS;  Surgeon: Marcine Matar, MD;  Location: [facility] OR;  Service: Urology;  Laterality: N/A;  1 HR   ENTEROSCOPY N/A 06/22/2022   Procedure: ENTEROSCOPY;  Surgeon: Meridee Score Netty Starring., MD;  Location: [facility] ENDOSCOPY;  Service: Gastroenterology;  Laterality: N/A;   HEMOSTASIS CLIP PLACEMENT  06/22/2022   Procedure: HEMOSTASIS CLIP PLACEMENT;  Surgeon: Lemar Lofty., MD;  Location: [facility] ENDOSCOPY;  Service: Gastroenterology;;   HOT HEMOSTASIS N/A 06/22/2022   Procedure: HOT HEMOSTASIS (ARGON PLASMA COAGULATION/BICAP);  Surgeon: Lemar Lofty., MD;  Location: [facility] ENDOSCOPY;  Service: Gastroenterology;  Laterality: N/A;   I & D EXTREMITY Right 12/15/2018   Procedure: DEBRIDEMENT RIGHT FOOT;  Surgeon: Nadara Mustard, MD;  Location: [facility] OR;  Service: Orthopedics;  Laterality: Right;   I & D EXTREMITY Right 08/27/2019   Procedure: PARTIAL CUBOID EXCISION RIGHT FOOT;  Surgeon: Nadara Mustard, MD;  Location: [facility] OR;  Service: Orthopedics;  Laterality: Right;   I & D EXTREMITY Right 01/07/2020   Procedure: RIGHT FOOT EXCISION INFECTED BONE;  Surgeon: Nadara Mustard, MD;  Location: [facility] OR;  Service: Orthopedics;  Laterality: Right;   I & D EXTREMITY Right 05/17/2022   Procedure: IRRIGATION AND DEBRIDEMENT RIGHT FOOT ABSCESS;  Surgeon: Nadara Mustard, MD;  Location: [facility] OR;  Service: Orthopedics;  Laterality: Right;   IR  PARACENTESIS  10/11/2022   NECK SURGERY     novemver 2019   POLYPECTOMY  06/22/2022   Procedure: POLYPECTOMY;  Surgeon: Meridee Score Netty Starring., MD;  Location: [facility] ENDOSCOPY;  Service: Gastroenterology;;   POLYPECTOMY  06/23/2022   Procedure: POLYPECTOMY;  Surgeon: Sherrilyn Rist, MD;  Location: [facility] ENDOSCOPY;  Service: Gastroenterology;;   STUMP REVISION Left 06/27/2021   Procedure: REVISION LEFT BELOW KNEE AMPUTATION;  Surgeon: Nadara Mustard, MD;  Location: [facility] OR;  Service: Orthopedics;  Laterality: Left;   SUBMUCOSAL LIFTING INJECTION  06/22/2022   Procedure: SUBMUCOSAL LIFTING INJECTION;  Surgeon: Meridee Score Netty Starring., MD;  Location: [facility] ENDOSCOPY;  Service: Gastroenterology;;   SUBMUCOSAL TATTOO INJECTION  06/22/2022   Procedure: SUBMUCOSAL TATTOO INJECTION;  Surgeon: Lemar Lofty., MD;  Location: [facility] ENDOSCOPY;  Service: Gastroenterology;;   TRANSURETHRAL RESECTION  OF PROSTATE N/A 06/28/2020   Procedure: TRANSURETHRAL RESECTION OF THE PROSTATE (TURP);  Surgeon: Marcine Matar, MD;  Location: [facility] OR;  Service: Urology;  Laterality: N/A;   WISDOM TOOTH EXTRACTION     Patient Active Problem List   Diagnosis Date Noted   Right below-knee amputee (HCC) 07/29/2022   Severe sepsis (HCC) 07/23/2022   Foot infection 07/22/2022   Acute respiratory failure (HCC) 07/22/2022   Anemia due to chronic blood loss 06/23/2022   Heme positive stool 06/23/2022   Benign neoplasm of cecum 06/23/2022   Benign neoplasm of transverse colon 06/23/2022   Benign neoplasm of descending colon 06/23/2022   Abscess of right foot 05/17/2022   Contraindication to anticoagulation therapy GU Bleed 03/13/2022   Paroxysmal atrial fibrillation (HCC) 08/18/2021   Anemia of chronic renal failure 08/18/2021   Thrombocytopenia (HCC) 08/18/2021   Acute metabolic encephalopathy 08/18/2021   Action induced myoclonus 08/18/2021   Left below-knee amputee (HCC) 05/11/2021   Symptomatic anemia 09/13/2020   COVID-19  virus infection 09/13/2020   Pressure injury of skin 06/29/2020   Gross hematuria 06/16/2020   Typical atrial flutter (HCC) 03/24/2020   Bilateral pleural effusion 02/24/2020   Healthcare maintenance 01/28/2020   Shortness of breath 01/28/2020   History of partial ray amputation of fourth toe of right foot (HCC) 09/08/2019   Chronic cough 06/25/2019   Cutaneous abscess of right foot    Subacute osteomyelitis, right ankle and foot (HCC) 12/14/2018   AKI (acute kidney injury) (HCC) 12/14/2018   ESRD on dialysis (HCC) 12/14/2018   S/P CABG (coronary artery bypass graft) 12/11/2018   Gait abnormality 08/24/2018   Diabetic neuropathy (HCC) 02/06/2018   Cervical myelopathy (HCC) 02/06/2018   Onychomycosis 10/30/2017   Other spondylosis with radiculopathy, cervical region 01/27/2017   Midfoot ulcer, right, limited to breakdown of skin (HCC) 11/15/2016   Lateral epicondylitis, left elbow 08/12/2016   Prostate cancer (HCC) 06/19/2016   Cellulitis of right foot 12/24/2015   Gout 09/14/2012   Hypertension associated with diabetes (HCC) 09/14/2012   Hypothyroidism 09/14/2012   CAD (coronary artery disease) 09/14/2012   Insulin dependent type 2 diabetes mellitus (HCC) 09/14/2012   Hyperlipidemia associated with type 2 diabetes mellitus (HCC)    Neuropathy (HCC)     ONSET DATE: 10/31/2022 right BKA prosthesis delivery  REFERRING DIAG:  Z61.096E (ICD-10-CM) - Below-knee amputation of left lower extremity, initial encounter (HCC)  A54.098J (ICD-10-CM) - Below-knee amputation of right lower extremity, initial encounter (HCC)   THERAPY DIAG:  Unsteadiness on feet  Other abnormalities of gait and mobility  Muscle weakness (generalized)  Abnormal posture  Non-pressure chronic ulcer of skin of other sites with other specified severity (HCC)  Rationale for Evaluation and Treatment: Rehabilitation  SUBJECTIVE:   SUBJECTIVE STATEMENT: [employer] does not have recumbent stepper.  The  right prosthesis hurts some when standing on shin bone.    PERTINENT HISTORY: ESRD on home dialysis, HTN, HLD, CAD, DM2, PVD, prostate CA, cervical myelopathy,    PAIN:  Are you having pain? No  PRECAUTIONS: Fall and Other: No BP LUE  WEIGHT BEARING RESTRICTIONS: No  FALLS: Has patient fallen in last 6 months? Yes. Number of falls 1 his left prosthesis slipped off & denies injuries.   LIVING ENVIRONMENT: Lives with: lives with their spouse and CNA Mon-Fri 1:00-5:00 Lives in: House  Home Access: Ramped entrance Home layout:  3 level enters on middle floor which has bedroom & full bath. Stairs: downstairs has chair lift.  Has following  equipment at home: Single point cane, Walker - 2 wheeled, Environmental consultant - 4 wheeled, Wheelchair (manual), [employer], Grab bars, and Ramped entry  OCCUPATION: retired  PLOF: he was walking with RW with left prosthesis.    PATIENT GOALS:   to use 2 prostheses to walk with cane. He wants to bring garbage cans up sloped driveway.    OBJECTIVE:  COGNITION: Eval on 11/06/2022:  Overall cognitive status: Within functional limits for tasks assessed  POSTURE: Eval on 11/06/2022: rounded shoulders, forward head, flexed trunk , and weight shift left  LOWER EXTREMITY ROM:  ROM P:passive  A:active Right eval  Hip flexion   Hip extension   Hip abduction   Hip adduction   Hip internal rotation   Hip external rotation   Knee flexion   Knee extension seated P: -6*  Ankle dorsiflexion   Ankle plantarflexion   Ankle inversion   Ankle eversion    (Blank rows = not tested)  LOWER EXTREMITY MMT:  MMT Right eval Left eval  Hip flexion 4/5 4/5  Hip extension 3/5 3+5  Hip abduction 35 3+5  Hip adduction    Hip internal rotation    Hip external rotation    Knee flexion 3+/5 4/5  Knee extension 3+/5 4/5  Eval MMT gross seated & functional  (Blank rows = not tested)  TRANSFERS: Eval on 11/06/2022: Sit to stand: SBA 22" w/c using BUEs on armrests  requires RW for stabilization Stand to sit: SBA RW for stabilization to 22" w/c using BUEs on armrests  GAIT: Eval on 11/06/2022: Gait pattern: step to pattern, decreased step length- Left, decreased stance time- Right, Right hip hike, Left hip hike, knee flexed in stance- Right, lateral hip instability, trunk flexed, and wide BOS Distance walked: 25' turning 180* CW & CCW Assistive device utilized: Environmental consultant - 2 wheeled and bilateral TTA prostheses Level of assistance: Min A  FUNCTIONAL TESTs:  Eval on 11/06/2022: Standing balance with RW support with close supervision: reaches 5" anteriorly, looks to side only when looking over shoulders and reaches towards floor within 10".    CURRENT PROSTHETIC WEAR ASSESSMENT: 11/06/2022:  Patient is independent with: prosthetic cleaning and ply sock cleaning Patient is dependent with: skin check, residual limb care, proper wear schedule/adjustment, and proper weight-bearing schedule/adjustment Donning prosthesis: SBA / verbal cues Doffing prosthesis: Modified independence Prosthetic wear tolerance: pt reports wearing left prosthesis daily all awake hours for several months.  Right prosthesis has worn daily for 6 days since receiving it for most of awake hours.  Prosthetic weight bearing tolerance: 5 minutes with no c/o pain Edema: right limb has pitting edema with 5 sec capillary refill.  Left limb has no edema noted. Residual limb condition: Left limb has superficial wound 5mm with white perimeter from excessive fluid & CNA reports some drainage;  Right limb has no open areas, distal area has redness & shiny skin, normal temperature, no hair growth.  Prosthetic description: silicon liners (9mm over tibia & 6mm posterior portion) with pin lock suspension, socket with ratchet suspension, flexible Keel feet     TODAY'S TREATMENT:  DATE:  8/12/024: Prosthetic Training with Transtibial Prostheses: PT recommended tightening straps during day as limb shrinks with intermittent weight bearing. Pt & wife verbalized understanding.  PT demo & verbal cues on rollator walker including sit/stand, ramp & curb.  Pt amb 50' including 180* turn with minA/CGA. Stand to sit on rollator seat with CGA / minA to stabilize walker and sit to stand from seat with minA including arising & stabilizing rollator walker.  Pt neg 6.5" curb with minA / CGA and 12* incline with maxA downhill including 2nd PT to control rollator / modA uphill.   Therapeutic Exercise:  Nustep seat 12 level 5 with BLEs & BUEs 8 min Step up 4" box with rollator walker BUE support 1 rep leading up & down LLE and 1 rep leading RLE. PT demo & verbal cues on technique.    11/13/2022: Prosthetic Training with Transtibial Prostheses: Pt reports that wound on left leg is healing with use of Tegaderm. He has deep redness on anterior limb medial & lateral to tibia and posterior limb.  He reports itching on limbs and redness appears to be from possible scratching.  Pt recommended rubbing with wet washcloth or dry cloth if warm washcloth not available to decrease scratching. Pt verbalized understanding.  Pt to check out if [employer] where he goes has a recumbent stepper. PT explained how can be used to build LE endurance for walking.    Neuromuscular Re-eduction: Seated on 24" bar stool with feet on floor without UE support 5 min and intermittently between exercises. PT cues for upright trunk posture including look forward to decrease head forward.   Sit to / from stand 24" bar stool with one hand on sink 5 reps with ea UE with minA/CGA. Standing with BUE sink support: hip rotation internal to external to neutral to teach how hip vs prosthesis for toe-out.  Pt performed 10 reps ea LE and pt / wife verbalized better understanding.  Standing with BUE sink support: stepping lateral /  abduction and back so stance LE under pelvis alternating LEs 10 reps. Standing with BUE sink support: hip hike then stepping posterior / extension and back so stance LE under pelvis alternating LEs 10 reps.   11/06/2022:  PATIENT EDUCATION: PATIENT EDUCATED ON FOLLOWING PROSTHETIC CARE: Education details:   Skin check, Residual limb care, Propper donning, and Proper wear schedule/adjustment Prosthetic wear tolerance: right prosthesis 4 hours on, 1 hour off rotating during awake hours including removing liner so limb gets air.   Person educated: Patient, Spouse, and Caregiver CNA Education method: Explanation, Demonstration, and Verbal cues Education comprehension: verbalized understanding, verbal cues required, and needs further education  HOME EXERCISE PROGRAM:  ASSESSMENT:  CLINICAL IMPRESSION: PT worked on prosthetic gait with rollator walker which he needs more practice & skilled PT intervention before using independently.   OBJECTIVE IMPAIRMENTS: Abnormal gait, decreased activity tolerance, decreased balance, decreased endurance, decreased knowledge of use of DME, decreased mobility, difficulty walking, decreased ROM, decreased strength, increased edema, postural dysfunction, and prosthetic dependency .   ACTIVITY LIMITATIONS: carrying, standing, stairs, transfers, locomotion level, and standing ADLs  PARTICIPATION LIMITATIONS: community activity and household mobility  PERSONAL FACTORS: Age, Fitness, Past/current experiences, Time since onset of injury/illness/exacerbation, and 3+ comorbidities: see PMH  are also affecting patient's functional outcome.   REHAB POTENTIAL: Good  CLINICAL DECISION MAKING: Evolving/moderate complexity  EVALUATION COMPLEXITY: Moderate   GOALS: Goals reviewed with patient? Yes  SHORT TERM GOALS: Target date: 12/05/2022  Patient verbalizes proper skin  care & checking limbs.  Baseline: SEE OBJECTIVE DATA Goal status: Ongoing 11/18/2022 2.   Patient tolerates prostheses >12 hrs total /day without skin issues or limb pain Baseline: SEE OBJECTIVE DATA Goal status: Ongoing 11/18/2022  3.  Patient able to reach 10" and look over both shoulders with single UE support on RW safely. Baseline: SEE OBJECTIVE DATA Goal status: Ongoing 11/18/2022  4. Patient ambulates 200' with RW & prostheses with supervision. Baseline: SEE OBJECTIVE DATA Goal status: Ongoing 11/18/2022  5. Patient negotiates ramps & curbs with RW & prostheses with minA. Baseline: SEE OBJECTIVE DATA Goal status: Ongoing 11/18/2022  LONG TERM GOALS: Target date: 01/30/2023  Patient demonstrates & verbalized understanding of prosthetic care to enable safe utilization of prosthesis. Baseline: SEE OBJECTIVE DATA Goal status:   Ongoing 11/18/2022  Patient tolerates prostheses wear >90% of awake hours without skin or limb pain issues. Baseline: SEE OBJECTIVE DATA Goal status: Ongoing 11/18/2022  Tasks of [employer] with RW support >/= 30/56 Baseline: SEE OBJECTIVE DATA Goal status: Ongoing 11/18/2022  Patient ambulates >300' with prosthesis & LRAD independently Baseline: SEE OBJECTIVE DATA Goal status: Ongoing 11/18/2022  Patient negotiates ramps, curbs & stairs with single rail with prostheses & LRAD modified independent. Baseline: SEE OBJECTIVE DATA Goal status: Ongoing 11/18/2022  Patient verbalizes & demonstrates understanding of how to properly use fitness equipment to return to gym. Baseline: SEE OBJECTIVE DATA Goal status:  Ongoing 11/18/2022  PLAN:  PT FREQUENCY: 2x/week  PT DURATION: 12 weeks  PLANNED INTERVENTIONS: Therapeutic exercises, Therapeutic activity, Neuromuscular re-education, Balance training, Gait training, Patient/Family education, Self Care, Stair training, Prosthetic training, DME instructions, Aquatic Therapy, Re-evaluation, and physical performance testing  PLAN FOR NEXT SESSION: leg press, prosthetic gait with rollator walker.  Balance activities.   Vladimir Faster, PT, DPT 11/18/2022, 11:44 AM

## 2022-11-20 ENCOUNTER — Ambulatory Visit (INDEPENDENT_AMBULATORY_CARE_PROVIDER_SITE_OTHER): Payer: Medicare Other | Admitting: Physical Therapy

## 2022-11-20 ENCOUNTER — Encounter: Payer: Self-pay | Admitting: Physical Therapy

## 2022-11-20 DIAGNOSIS — L98498 Non-pressure chronic ulcer of skin of other sites with other specified severity: Secondary | ICD-10-CM

## 2022-11-20 DIAGNOSIS — R2681 Unsteadiness on feet: Secondary | ICD-10-CM | POA: Diagnosis not present

## 2022-11-20 DIAGNOSIS — Z9181 History of falling: Secondary | ICD-10-CM

## 2022-11-20 DIAGNOSIS — R2689 Other abnormalities of gait and mobility: Secondary | ICD-10-CM | POA: Diagnosis not present

## 2022-11-20 DIAGNOSIS — M6281 Muscle weakness (generalized): Secondary | ICD-10-CM | POA: Diagnosis not present

## 2022-11-20 DIAGNOSIS — R293 Abnormal posture: Secondary | ICD-10-CM | POA: Diagnosis not present

## 2022-11-20 DIAGNOSIS — Z7409 Other reduced mobility: Secondary | ICD-10-CM

## 2022-11-20 NOTE — Therapy (Signed)
OUTPATIENT PHYSICAL THERAPY PROSTHETIC TREATMENT   Patient Name: TREMIR SIEGMANN MRN: [id number] DOB:12-08-1950, 72 y.o., male Today's Date: 11/20/2022  PCP: Adolph Pollack, FNP REFERRING PROVIDER: Aldean Baker, MD  END OF SESSION:  PT End of Session - 11/20/22 1049     Visit Number 4    Number of Visits 25    Date for PT Re-Evaluation 01/30/23    Authorization Type Medicare & BCBS supplement    Progress Note Due on Visit 10    PT Start Time 1051    PT Stop Time 1144    PT Time Calculation (min) 53 min    Equipment Utilized During Treatment Gait belt    Activity Tolerance Patient tolerated treatment well    Behavior During Therapy WFL for tasks assessed/performed                Past Medical History:  Diagnosis Date   Acute osteomyelitis of metatarsal bone of left foot (HCC)    Ambulates with cane    straight cane   Anemia    Cervical myelopathy (HCC) 02/06/2018   Chronic kidney disease    dailysis M W F- home   Community acquired pneumonia of left lower lobe of lung 08/18/2021   Complication of anesthesia    Coronary artery disease    Dehiscence of amputation stump of left lower extremity (HCC)    Diabetes mellitus without complication (HCC)    type2   Diabetic foot ulcer (HCC)    Diabetic neuropathy (HCC) 02/06/2018   Dysrhythmia    Gait abnormality 08/24/2018   GERD (gastroesophageal reflux disease)     01/06/20- not current   Gout    History of blood transfusion    History of blood transfusion    History of kidney stones    passed stones   Hypercholesteremia    Hypertension    Hypothyroidism    Neuromuscular disorder (HCC)    neuropathy left leg and bilateral feet   Neuropathy    Partial nontraumatic amputation of foot, 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   PONV (postoperative nausea and vomiting)    Prostate cancer (HCC)    PVD (peripheral vascular disease) (HCC)    with amputations   Past Surgical History:  Procedure Laterality Date   A-FLUTTER  ABLATION N/A 04/06/2020   Procedure: A-FLUTTER ABLATION;  Surgeon: Marinus Maw, MD;  Location: [facility] INVASIVE CV LAB;  Service: Cardiovascular;  Laterality: N/A;   AMPUTATION Left 12/25/2013   Procedure: AMPUTATION RAY LEFT 5TH RAY;  Surgeon: Nadara Mustard, MD;  Location: [facility] ORS;  Service: Orthopedics;  Laterality: Left;   AMPUTATION Right 12/15/2018   Procedure: AMPUTATION OF 4TH AND 5TH TOES RIGHT FOOT;  Surgeon: Nadara Mustard, MD;  Location: [facility] OR;  Service: Orthopedics;  Laterality: Right;   AMPUTATION Left 02/02/2021   Procedure: LEFT FOOT 4TH RAY AMPUTATION;  Surgeon: Nadara Mustard, MD;  Location: [facility] OR;  Service: Orthopedics;  Laterality: Left;   AMPUTATION Left 05/09/2021   Procedure: LEFT BELOW KNEE AMPUTATION;  Surgeon: Nadara Mustard, MD;  Location: [facility] OR;  Service: Orthopedics;  Laterality: Left;   AMPUTATION Right 07/26/2022   Procedure: RIGHT BELOW KNEE AMPUTATION;  Surgeon: Nadara Mustard, MD;  Location: [facility] OR;  Service: Orthopedics;  Laterality: Right;   APPLICATION OF WOUND VAC Right 12/15/2018   Procedure: APPLICATION OF WOUND VAC;  Surgeon: Nadara Mustard, MD;  Location: [facility] OR;  Service: Orthopedics;  Laterality: Right;   AV FISTULA PLACEMENT  Left 03/25/2019   Procedure: LEFT ARM ARTERIOVENOUS (AV) FISTULA CREATION;  Surgeon: Nada Libman, MD;  Location: [facility] OR;  Service: Vascular;  Laterality: Left;   BACK SURGERY     BASCILIC VEIN TRANSPOSITION Left 05/20/2019   Procedure: SECOND STAGE LEFT BASCILIC VEIN TRANSPOSITION;  Surgeon: Nada Libman, MD;  Location: [facility] OR;  Service: Vascular;  Laterality: Left;   BIOPSY  06/22/2022   Procedure: BIOPSY;  Surgeon: Lemar Lofty., MD;  Location: [facility] ENDOSCOPY;  Service: Gastroenterology;;   CARDIAC CATHETERIZATION  02/17/2014   CHOLECYSTECTOMY     COLONOSCOPY  2011   in [place], Normal   COLONOSCOPY N/A 06/23/2022   Procedure: COLONOSCOPY;  Surgeon: Sherrilyn Rist, MD;  Location: [facility] ENDOSCOPY;  Service:  Gastroenterology;  Laterality: N/A;   CORONARY ARTERY BYPASS GRAFT  2008   CYSTOSCOPY N/A 06/29/2020   Procedure: CYSTOSCOPY WITH CLOT EVACUATION AND  FULGERATION;  Surgeon: Marcine Matar, MD;  Location: [facility] OR;  Service: Urology;  Laterality: N/A;   CYSTOSCOPY WITH FULGERATION Bilateral 06/17/2020   Procedure: CYSTOSCOPY,BILATERAL RETROGRADE, CLOT EVACUATION WITH FULGERATION OF THE BLADDER;  Surgeon: Jannifer Hick, MD;  Location: [facility] OR;  Service: Urology;  Laterality: Bilateral;   CYSTOSCOPY WITH FULGERATION N/A 06/28/2020   Procedure: CYSTOSCOPY WITH CLOT EVACUATION AND FULGERATION OF BLEEDERS;  Surgeon: Marcine Matar, MD;  Location: [facility] OR;  Service: Urology;  Laterality: N/A;  1 HR   ENTEROSCOPY N/A 06/22/2022   Procedure: ENTEROSCOPY;  Surgeon: Meridee Score Netty Starring., MD;  Location: [facility] ENDOSCOPY;  Service: Gastroenterology;  Laterality: N/A;   HEMOSTASIS CLIP PLACEMENT  06/22/2022   Procedure: HEMOSTASIS CLIP PLACEMENT;  Surgeon: Lemar Lofty., MD;  Location: [facility] ENDOSCOPY;  Service: Gastroenterology;;   HOT HEMOSTASIS N/A 06/22/2022   Procedure: HOT HEMOSTASIS (ARGON PLASMA COAGULATION/BICAP);  Surgeon: Lemar Lofty., MD;  Location: [facility] ENDOSCOPY;  Service: Gastroenterology;  Laterality: N/A;   I & D EXTREMITY Right 12/15/2018   Procedure: DEBRIDEMENT RIGHT FOOT;  Surgeon: Nadara Mustard, MD;  Location: [facility] OR;  Service: Orthopedics;  Laterality: Right;   I & D EXTREMITY Right 08/27/2019   Procedure: PARTIAL CUBOID EXCISION RIGHT FOOT;  Surgeon: Nadara Mustard, MD;  Location: [facility] OR;  Service: Orthopedics;  Laterality: Right;   I & D EXTREMITY Right 01/07/2020   Procedure: RIGHT FOOT EXCISION INFECTED BONE;  Surgeon: Nadara Mustard, MD;  Location: [facility] OR;  Service: Orthopedics;  Laterality: Right;   I & D EXTREMITY Right 05/17/2022   Procedure: IRRIGATION AND DEBRIDEMENT RIGHT FOOT ABSCESS;  Surgeon: Nadara Mustard, MD;  Location: [facility] OR;  Service: Orthopedics;  Laterality:  Right;   IR PARACENTESIS  10/11/2022   NECK SURGERY     novemver 2019   POLYPECTOMY  06/22/2022   Procedure: POLYPECTOMY;  Surgeon: Meridee Score Netty Starring., MD;  Location: [facility] ENDOSCOPY;  Service: Gastroenterology;;   POLYPECTOMY  06/23/2022   Procedure: POLYPECTOMY;  Surgeon: Sherrilyn Rist, MD;  Location: [facility] ENDOSCOPY;  Service: Gastroenterology;;   STUMP REVISION Left 06/27/2021   Procedure: REVISION LEFT BELOW KNEE AMPUTATION;  Surgeon: Nadara Mustard, MD;  Location: [facility] OR;  Service: Orthopedics;  Laterality: Left;   SUBMUCOSAL LIFTING INJECTION  06/22/2022   Procedure: SUBMUCOSAL LIFTING INJECTION;  Surgeon: Meridee Score Netty Starring., MD;  Location: [facility] ENDOSCOPY;  Service: Gastroenterology;;   SUBMUCOSAL TATTOO INJECTION  06/22/2022   Procedure: SUBMUCOSAL TATTOO INJECTION;  Surgeon: Lemar Lofty., MD;  Location: [facility] ENDOSCOPY;  Service: Gastroenterology;;   [place]  RESECTION OF PROSTATE N/A 06/28/2020   Procedure: TRANSURETHRAL RESECTION OF THE PROSTATE (TURP);  Surgeon: Marcine Matar, MD;  Location: [facility] OR;  Service: Urology;  Laterality: N/A;   WISDOM TOOTH EXTRACTION     Patient Active Problem List   Diagnosis Date Noted   Right below-knee amputee (HCC) 07/29/2022   Severe sepsis (HCC) 07/23/2022   Foot infection 07/22/2022   Acute respiratory failure (HCC) 07/22/2022   Anemia due to chronic blood loss 06/23/2022   Heme positive stool 06/23/2022   Benign neoplasm of cecum 06/23/2022   Benign neoplasm of transverse colon 06/23/2022   Benign neoplasm of descending colon 06/23/2022   Abscess of right foot 05/17/2022   Contraindication to anticoagulation therapy GU Bleed 03/13/2022   Paroxysmal atrial fibrillation (HCC) 08/18/2021   Anemia of chronic renal failure 08/18/2021   Thrombocytopenia (HCC) 08/18/2021   Acute metabolic encephalopathy 08/18/2021   Action induced myoclonus 08/18/2021   Left below-knee amputee (HCC) 05/11/2021   Symptomatic anemia  09/13/2020   COVID-19 virus infection 09/13/2020   Pressure injury of skin 06/29/2020   Gross hematuria 06/16/2020   Typical atrial flutter (HCC) 03/24/2020   Bilateral pleural effusion 02/24/2020   Healthcare maintenance 01/28/2020   Shortness of breath 01/28/2020   History of partial ray amputation of fourth toe of right foot (HCC) 09/08/2019   Chronic cough 06/25/2019   Cutaneous abscess of right foot    Subacute osteomyelitis, right ankle and foot (HCC) 12/14/2018   AKI (acute kidney injury) (HCC) 12/14/2018   ESRD on dialysis (HCC) 12/14/2018   S/P CABG (coronary artery bypass graft) 12/11/2018   Gait abnormality 08/24/2018   Diabetic neuropathy (HCC) 02/06/2018   Cervical myelopathy (HCC) 02/06/2018   Onychomycosis 10/30/2017   Other spondylosis with radiculopathy, cervical region 01/27/2017   Midfoot ulcer, right, limited to breakdown of skin (HCC) 11/15/2016   Lateral epicondylitis, left elbow 08/12/2016   Prostate cancer (HCC) 06/19/2016   Cellulitis of right foot 12/24/2015   Gout 09/14/2012   Hypertension associated with diabetes (HCC) 09/14/2012   Hypothyroidism 09/14/2012   CAD (coronary artery disease) 09/14/2012   Insulin dependent type 2 diabetes mellitus (HCC) 09/14/2012   Hyperlipidemia associated with type 2 diabetes mellitus (HCC)    Neuropathy (HCC)     ONSET DATE: 10/31/2022 right BKA prosthesis delivery  REFERRING DIAG:  G38.756E (ICD-10-CM) - Below-knee amputation of left lower extremity, initial encounter (HCC)  P32.951O (ICD-10-CM) - Below-knee amputation of right lower extremity, initial encounter (HCC)   THERAPY DIAG:  Unsteadiness on feet  Other abnormalities of gait and mobility  Muscle weakness (generalized)  Abnormal posture  Non-pressure chronic ulcer of skin of other sites with other specified severity (HCC)  History of falling  Impaired functional mobility, balance, gait, and endurance  Rationale for Evaluation and Treatment:  Rehabilitation  SUBJECTIVE:   SUBJECTIVE STATEMENT: He wears prostheses all awake hours except during home dialysis. The wound on older LE has healed but has an area over knee on RLE now.    PERTINENT HISTORY: ESRD on home dialysis, HTN, HLD, CAD, DM2, PVD, prostate CA, cervical myelopathy,    PAIN:  Are you having pain? No  PRECAUTIONS: Fall and Other: No BP LUE  WEIGHT BEARING RESTRICTIONS: No  FALLS: Has patient fallen in last 6 months? Yes. Number of falls 1 his left prosthesis slipped off & denies injuries.   LIVING ENVIRONMENT: Lives with: lives with their spouse and CNA Mon-Fri 1:00-5:00 Lives in: House  Home Access: Ramped entrance Home layout:  3 level enters on middle floor which has bedroom & full bath. Stairs: downstairs has chair lift.  Has following equipment at home: Single point cane, Walker - 2 wheeled, Environmental consultant - 4 wheeled, Wheelchair (manual), [employer], Grab bars, and Ramped entry  OCCUPATION: retired  PLOF: he was walking with RW with left prosthesis.    PATIENT GOALS:   to use 2 prostheses to walk with cane. He wants to bring garbage cans up sloped driveway.    OBJECTIVE:  COGNITION: Eval on 11/06/2022:  Overall cognitive status: Within functional limits for tasks assessed  POSTURE: Eval on 11/06/2022: rounded shoulders, forward head, flexed trunk , and weight shift left  LOWER EXTREMITY ROM:  ROM P:passive  A:active Right eval  Hip flexion   Hip extension   Hip abduction   Hip adduction   Hip internal rotation   Hip external rotation   Knee flexion   Knee extension seated P: -6*  Ankle dorsiflexion   Ankle plantarflexion   Ankle inversion   Ankle eversion    (Blank rows = not tested)  LOWER EXTREMITY MMT:  MMT Right eval Left eval  Hip flexion 4/5 4/5  Hip extension 3/5 3+5  Hip abduction 35 3+5  Hip adduction    Hip internal rotation    Hip external rotation    Knee flexion 3+/5 4/5  Knee extension 3+/5 4/5  Eval MMT gross  seated & functional  (Blank rows = not tested)  TRANSFERS: Eval on 11/06/2022: Sit to stand: SBA 22" w/c using BUEs on armrests requires RW for stabilization Stand to sit: SBA RW for stabilization to 22" w/c using BUEs on armrests  GAIT: Eval on 11/06/2022: Gait pattern: step to pattern, decreased step length- Left, decreased stance time- Right, Right hip hike, Left hip hike, knee flexed in stance- Right, lateral hip instability, trunk flexed, and wide BOS Distance walked: 25' turning 180* CW & CCW Assistive device utilized: Environmental consultant - 2 wheeled and bilateral TTA prostheses Level of assistance: Min A  FUNCTIONAL TESTs:  Eval on 11/06/2022: Standing balance with RW support with close supervision: reaches 5" anteriorly, looks to side only when looking over shoulders and reaches towards floor within 10".    CURRENT PROSTHETIC WEAR ASSESSMENT: 11/06/2022:  Patient is independent with: prosthetic cleaning and ply sock cleaning Patient is dependent with: skin check, residual limb care, proper wear schedule/adjustment, and proper weight-bearing schedule/adjustment Donning prosthesis: SBA / verbal cues Doffing prosthesis: Modified independence Prosthetic wear tolerance: pt reports wearing left prosthesis daily all awake hours for several months.  Right prosthesis has worn daily for 6 days since receiving it for most of awake hours.  Prosthetic weight bearing tolerance: 5 minutes with no c/o pain Edema: right limb has pitting edema with 5 sec capillary refill.  Left limb has no edema noted. Residual limb condition: Left limb has superficial wound 5mm with white perimeter from excessive fluid & CNA reports some drainage;  Right limb has no open areas, distal area has redness & shiny skin, normal temperature, no hair growth.  Prosthetic description: silicon liners (9mm over tibia & 6mm posterior portion) with pin lock suspension, socket with ratchet suspension, flexible Keel feet     TODAY'S  TREATMENT:  DATE:  11/20/2022: Therapeutic Exercise: Leg press BLEs 100# 15 reps; marching LEs 1x each during ext 75# 15 reps;  single LE 43# ea LE 10 reps 1 set, then 37# ea LE 10 reps 1 set; Pt & wife verbalized understanding as part of his gym workout.   Prosthetic Training with Transtibial Prostheses: LLE wound is now 2mm X 3mm with no signs of infection.  He can continue no using Tegaderm as long as wound continues to decrease in size.   RLE wound over patella 4mm X 5mm with no signs of infection covered with Tegaderm.  PT recommended continuing Tegaderm until it heals, then using baby oil to reduce friction of liner with knee flexion. Pt & wife verbalized understanding.  PT instructed in walking program to increase stamina and endurance - see HEP below.  He needs supervision for now. Pt & wife verbalized understanding.  PT demo & verbal cues how to follow with w/c while he is ambulating and able to safely supervise.     8/12/024: Prosthetic Training with Transtibial Prostheses: PT recommended tightening straps during day as limb shrinks with intermittent weight bearing. Pt & wife verbalized understanding.  PT demo & verbal cues on rollator walker including sit/stand, ramp & curb.  Pt amb 50' including 180* turn with minA/CGA. Stand to sit on rollator seat with CGA / minA to stabilize walker and sit to stand from seat with minA including arising & stabilizing rollator walker.  Pt neg 6.5" curb with minA / CGA and 12* incline with maxA downhill including 2nd PT to control rollator / modA uphill.   Therapeutic Exercise:  Nustep seat 12 level 5 with BLEs & BUEs 8 min Step up 4" box with rollator walker BUE support 1 rep leading up & down LLE and 1 rep leading RLE. PT demo & verbal cues on technique.    11/13/2022: Prosthetic Training with Transtibial Prostheses: Pt  reports that wound on left leg is healing with use of Tegaderm. He has deep redness on anterior limb medial & lateral to tibia and posterior limb.  He reports itching on limbs and redness appears to be from possible scratching.  Pt recommended rubbing with wet washcloth or dry cloth if warm washcloth not available to decrease scratching. Pt verbalized understanding.  Pt to check out if [employer] gym where he goes has a recumbent stepper. PT explained how can be used to build LE endurance for walking.    Neuromuscular Re-eduction: Seated on 24" bar stool with feet on floor without UE support 5 min and intermittently between exercises. PT cues for upright trunk posture including look forward to decrease head forward.   Sit to / from stand 24" bar stool with one hand on sink 5 reps with ea UE with minA/CGA. Standing with BUE sink support: hip rotation internal to external to neutral to teach how hip vs prosthesis for toe-out.  Pt performed 10 reps ea LE and pt / wife verbalized better understanding.  Standing with BUE sink support: stepping lateral / abduction and back so stance LE under pelvis alternating LEs 10 reps. Standing with BUE sink support: hip hike then stepping posterior / extension and back so stance LE under pelvis alternating LEs 10 reps.   11/06/2022:  PATIENT EDUCATION: PATIENT EDUCATED ON FOLLOWING PROSTHETIC CARE: Education details:   Skin check, Residual limb care, Propper donning, and Proper wear schedule/adjustment Prosthetic wear tolerance: right prosthesis 4 hours on, 1 hour off rotating during awake hours including removing liner so  limb gets air.   Person educated: Patient, Spouse, and Caregiver CNA Education method: Explanation, Demonstration, and Verbal cues Education comprehension: verbalized understanding, verbal cues required, and needs further education  HOME EXERCISE PROGRAM: Increasing your activity level is important.  Short distances which is walking from one  room to another. Work to increase frequency back to prior level. Start with walk each hour during awake hours, then every 45-50 min, decreasing until he is moving some every 30 min during day   Medium distances are entering & exiting your home or community with limited distances. Start with 4 medium walks which is one outing to one location and increase number of tolerated amounts per day.  Long distance is your highest tolerance for you. Walk until you feel you must rest. Back or leg pain or general fatigue are indicators to maximum tolerance. Monitor by distance or time. Try to walk your BEST distance 1-2 times per day. You should see this increase over time.      ASSESSMENT:  CLINICAL IMPRESSION: Pt & wife appear to understand leg press exercise for gym and rationale.  They also appear to understand recommendations for increasing activity tolerance.    OBJECTIVE IMPAIRMENTS: Abnormal gait, decreased activity tolerance, decreased balance, decreased endurance, decreased knowledge of use of DME, decreased mobility, difficulty walking, decreased ROM, decreased strength, increased edema, postural dysfunction, and prosthetic dependency .   ACTIVITY LIMITATIONS: carrying, standing, stairs, transfers, locomotion level, and standing ADLs  PARTICIPATION LIMITATIONS: community activity and household mobility  PERSONAL FACTORS: Age, Fitness, Past/current experiences, Time since onset of injury/illness/exacerbation, and 3+ comorbidities: see PMH  are also affecting patient's functional outcome.   REHAB POTENTIAL: Good  CLINICAL DECISION MAKING: Evolving/moderate complexity  EVALUATION COMPLEXITY: Moderate   GOALS: Goals reviewed with patient? Yes  SHORT TERM GOALS: Target date: 12/05/2022  Patient verbalizes proper skin care & checking limbs.  Baseline: SEE OBJECTIVE DATA Goal status: Ongoing 11/18/2022 2.  Patient tolerates prostheses >12 hrs total /day without skin issues or limb  pain Baseline: SEE OBJECTIVE DATA Goal status: Ongoing 11/18/2022  3.  Patient able to reach 10" and look over both shoulders with single UE support on RW safely. Baseline: SEE OBJECTIVE DATA Goal status: Ongoing 11/18/2022  4. Patient ambulates 200' with RW & prostheses with supervision. Baseline: SEE OBJECTIVE DATA Goal status: Ongoing 11/18/2022  5. Patient negotiates ramps & curbs with RW & prostheses with minA. Baseline: SEE OBJECTIVE DATA Goal status: Ongoing 11/18/2022  LONG TERM GOALS: Target date: 01/30/2023  Patient demonstrates & verbalized understanding of prosthetic care to enable safe utilization of prosthesis. Baseline: SEE OBJECTIVE DATA Goal status:   Ongoing 11/18/2022  Patient tolerates prostheses wear >90% of awake hours without skin or limb pain issues. Baseline: SEE OBJECTIVE DATA Goal status: Ongoing 11/18/2022  Tasks of [employer] with RW support >/= 30/56 Baseline: SEE OBJECTIVE DATA Goal status: Ongoing 11/18/2022  Patient ambulates >300' with prosthesis & LRAD independently Baseline: SEE OBJECTIVE DATA Goal status: Ongoing 11/18/2022  Patient negotiates ramps, curbs & stairs with single rail with prostheses & LRAD modified independent. Baseline: SEE OBJECTIVE DATA Goal status: Ongoing 11/18/2022  Patient verbalizes & demonstrates understanding of how to properly use fitness equipment to return to gym. Baseline: SEE OBJECTIVE DATA Goal status:  Ongoing 11/18/2022  PLAN:  PT FREQUENCY: 2x/week  PT DURATION: 12 weeks  PLANNED INTERVENTIONS: Therapeutic exercises, Therapeutic activity, Neuromuscular re-education, Balance training, Gait training, Patient/Family education, Self Care, Stair training, Prosthetic training, DME instructions, Aquatic Therapy, Re-evaluation, and  physical performance testing  PLAN FOR NEXT SESSION: lcontinue prosthetic gait with rollator walker. Balance activities. Instruct in hamstring & gastroc stretches.    Vladimir Faster, PT, DPT 11/20/2022, 12:39 PM

## 2022-11-21 ENCOUNTER — Ambulatory Visit: Payer: Medicare Other | Admitting: Cardiology

## 2022-11-21 ENCOUNTER — Ambulatory Visit: Payer: Medicare Other | Admitting: Nurse Practitioner

## 2022-11-21 ENCOUNTER — Encounter: Payer: Self-pay | Admitting: Cardiology

## 2022-11-21 VITALS — BP 145/65 | HR 69 | Resp 18 | Ht 70.0 in

## 2022-11-21 DIAGNOSIS — R188 Other ascites: Secondary | ICD-10-CM

## 2022-11-21 DIAGNOSIS — I2510 Atherosclerotic heart disease of native coronary artery without angina pectoris: Secondary | ICD-10-CM

## 2022-11-21 DIAGNOSIS — I5032 Chronic diastolic (congestive) heart failure: Secondary | ICD-10-CM

## 2022-11-21 MED ORDER — TORSEMIDE 20 MG PO TABS
20.0000 mg | ORAL_TABLET | Freq: Two times a day (BID) | ORAL | 0 refills | Status: DC
Start: 2022-11-21 — End: 2022-11-29

## 2022-11-21 NOTE — Progress Notes (Signed)
Primary Physician/Referring:  Adolph Pollack, FNP  Patient ID: [name], male    DOB: 19-Oct-1950, 72 y.o.   MRN: [id number]  Chief Complaint  Patient presents with   Coronary artery disease involving native coronary artery of   Follow-up     HPI:    [name]  is a 72 y.o.  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  Since right BKA, patient states that he feels much better.  He has now started using prosthesis and is able to stand up with the help of walker walker and walk in the house without.  Due to development of ascites, heart failure question was raised and underwent echocardiogram and presents for follow-up. Since amputation, his health is significantly improved. He has no chest pain, no dyspnea, no PND or orthopnea.  Recently had peritoneal centesis but again is developing fluid.  Past Medical History:  Diagnosis Date   Acute osteomyelitis of metatarsal bone of left foot (HCC)    Ambulates with cane    straight cane   Anemia    Cervical myelopathy (HCC) 02/06/2018   Chronic kidney disease    dailysis M [facility] F- home   Community acquired pneumonia of left lower lobe of lung 08/18/2021   Complication of anesthesia    Coronary artery disease    Dehiscence of amputation stump of left lower extremity (HCC)    Diabetes mellitus without complication (HCC)    type2   Diabetic foot ulcer (HCC)    Diabetic neuropathy (HCC) 02/06/2018   Dysrhythmia    Gait abnormality 08/24/2018   GERD (gastroesophageal reflux disease)     01/06/20-  not current   Gout    History of blood transfusion    History of blood transfusion    History of kidney stones    passed stones   Hypercholesteremia    Hypertension    Hypothyroidism    Neuromuscular disorder (HCC)    neuropathy left leg and bilateral feet   Neuropathy    Partial nontraumatic amputation of foot, 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   PONV (postoperative nausea and vomiting)    Prostate cancer (HCC)    PVD (peripheral vascular disease) (HCC)    with amputations    Social History   Tobacco Use   Smoking status: Former    Types: Cigars    Start date: 04/08/1973    Quit date: 12/24/1988    Years since quitting: 33.9   Smokeless tobacco: Never   Tobacco comments:    Cigars and Pipe   Substance Use Topics   Alcohol use: Never   ROS  Review of Systems  Cardiovascular:  Negative for chest pain, dyspnea on exertion and leg swelling.   Objective  Blood pressure (!) 145/65, pulse 69, resp. rate 18, height 5\' 10"  (1.778 m), SpO2 90%.     11/21/2022    3:27 PM 11/05/2022    2:20 PM 11/05/2022    2:15 PM  Vitals with BMI  Height 5\' 10"     Systolic 145 106 962  Diastolic 65 46 44  Pulse 69  Physical Exam Constitutional:      Comments: Ill looking  Neck:     Vascular: Carotid bruit (bilateral carotid bruit) present. No JVD.  Cardiovascular:     Rate and Rhythm: Normal rate. Rhythm irregular.     Heart sounds: S1 normal and S2 normal. Murmur heard.     Early systolic murmur is present with a grade of 2/6 at the upper right sternal border.     Comments: Left arm AV fistula for dialysis patent. Bilateral BKA, stump healed well.  Pulmonary:     Effort: Pulmonary effort is normal.     Breath sounds: Normal breath sounds.  Abdominal:     General: Bowel sounds are normal. There is distension.     Palpations: Abdomen is soft. There is no mass.     Tenderness: There is no abdominal tenderness.  Musculoskeletal:        General: Deformity (Bilateral BKA) present.     Right  lower leg: No edema.  Skin:    Capillary Refill: Capillary refill takes less than 2 seconds.    Laboratory examination:   Recent Labs    08/08/22 1546 08/10/22 1300 08/11/22 0747 09/09/22 1118 10/11/22 1236  NA 135 136 136 131* 134*  K 5.6* 6.8* 5.0 5.6 No hemolysis seen* 4.7  CL 99 96* 91* 90* 94*  CO2 25 27 30 29 28   GLUCOSE 148* 150* 154* 93 111*  BUN 64* 48* 27* 52* 66*  CREATININE 7.71* 7.00* 5.03* 6.82* 7.18*  CALCIUM 8.2* 8.7* 9.0 8.5 8.9  GFRNONAA 7* 8* 12*  --   --       Latest Ref Rng & Units 10/11/2022   12:36 PM 09/09/2022   11:18 AM 08/11/2022    7:47 AM  CMP  Glucose 70 - 99 mg/dL 540  93  981   BUN 6 - 23 mg/dL 66  52  27   Creatinine 0.40 - 1.50 mg/dL 1.91  4.78  2.95   Sodium 135 - 145 mEq/L 134  131  136   Potassium 3.5 - 5.1 mEq/L 4.7  5.6 No hemolysis seen  5.0   Chloride 96 - 112 mEq/L 94  90  91   CO2 19 - 32 mEq/L 28  29  30    Calcium 8.4 - 10.5 mg/dL 8.9  8.5  9.0   Total Protein 6.0 - 8.3 g/dL 6.6  6.4    Total Bilirubin 0.2 - 1.2 mg/dL 1.2  1.2    Alkaline Phos 39 - 117 U/L 194  161    AST 0 - 37 U/L 25  16    ALT 0 - 53 U/L 19  9        Latest Ref Rng & Units 10/11/2022   12:36 PM 09/09/2022   11:18 AM 08/10/2022    1:00 PM  CBC  WBC 4.0 - 10.5 K/uL 4.9  4.7  6.7   Hemoglobin 13.0 - 17.0 g/dL 9.7  9.3  9.1   Hematocrit 39.0 - 52.0 % 30.0  28.0  29.5   Platelets 150.0 - 400.0 K/uL 75.0  78.0  127    HEMOGLOBIN A1C Lab Results  Component Value Date   HGBA1C 5.0 05/17/2022   MPG 96.8 05/17/2022   Medications and allergies   Allergies  Allergen Reactions   Morphine     Other reaction(s): Delusions (intolerance)   Mushroom Extract Complex Nausea Only    Current Outpatient Medications:    acetaminophen (TYLENOL) 325 MG tablet, Take  2 tablets (650 mg total) by mouth every 4 (four) hours as needed for fever or mild pain., Disp: , Rfl:    allopurinol (ZYLOPRIM) 100 MG tablet, Take 2 tablets (200 mg total) by mouth daily., Disp: 30 tablet,  Rfl: 0   levothyroxine (SYNTHROID) 112 MCG tablet, Take 1 tablet (112 mcg total) by mouth daily before breakfast., Disp: 30 tablet, Rfl: 0   losartan (COZAAR) 50 MG tablet, Take 50 mg by mouth 2 (two) times daily., Disp: , Rfl:    Methoxy PEG-Epoetin Beta (MIRCERA IJ), Inject into the skin., Disp: , Rfl:    modafinil (PROVIGIL) 100 MG tablet, Take 1 tablet (100 mg total) by mouth daily., Disp: 90 tablet, Rfl: 3   ondansetron (ZOFRAN) 4 MG tablet, Take 4 mg by mouth every 6 (six) hours., Disp: , Rfl:    OZEMPIC, 0.25 OR 0.5 MG/DOSE, 2 MG/3ML SOPN, Inject 0.25 mg into the skin once a week., Disp: , Rfl:    pantoprazole (PROTONIX) 40 MG tablet, Take 1 tablet (40 mg total) by mouth 2 (two) times daily before a meal., Disp: 180 tablet, Rfl: 3   pregabalin (LYRICA) 150 MG capsule, Take 150 mg by mouth 3 (three) times daily., Disp: , Rfl:    propranolol ER (INDERAL LA) 80 MG 24 hr capsule, Take 1 capsule (80 mg total) by mouth daily., Disp: 30 capsule, Rfl: 2   rosuvastatin (CRESTOR) 10 MG tablet, Take 1 tablet (10 mg total) by mouth daily., Disp: 30 tablet, Rfl: 0   tiZANidine (ZANAFLEX) 2 MG tablet, Take 1 tablet (2 mg total) by mouth 2 (two) times daily as needed for muscle spasms., Disp: 30 tablet, Rfl: 0   Vitamin D, Ergocalciferol, (DRISDOL) 1.25 MG (50000 UNIT) CAPS capsule, Take 1 capsule (50,000 Units total) by mouth every 7 (seven) days., Disp: 20 capsule, Rfl: 3    Radiology:   CXR 07/24/2022: 1. Interval extubation and NGT removal. 2. Stable cardiomegaly and CABG change.  Clear lungs. 3. Central vascular fullness without overt edema.  Cardiac Studies:   Lower extremity arterial duplex 05/12/2014:  No hemodynamically significant stenoses are identified in the bilateral lower extremity arterial system.This exam reveals normal perfusion of both the lower extremities with RABI 1.04 and LABI 1.09. There is mild diffuse disease involving the small vessels below the knee. Compared to the study  done on 06/08/2013, no significant change. Impression: Lower extremity venous insufficiency study 12/25/2016: No venous insufficiency or DVT in the right lower extremity. Reflexes in the left greater saphenous vein beginning in the distal thigh and involving the entire length of the calf. No obvious varicosities.  ABI 12/16/2018: Right: Resting right ankle-brachial index is within normal range. No evidence of significant right lower extremity arterial disease. Left: Resting left ankle-brachial index indicates noncompressible left lower extremity arteries.  Carotid artery duplex  05/07/2019: Stenosis in the bilateral internal carotid artery (16-49%), lower end of spectrum with heteregenous plaque.  Antegrade right vertebral artery flow. Antegrade left vertebral artery flow. Follow up in one year is appropriate if clinically indicated. Compared to 01/27/2018, no significant change.  Lexiscan Tetrofosmin Stress Test 03/20/2020: Non-diagnostic ECG stress due to pharmacologic stress testing. Resting EKG demonstrated atrial flutter. Non-specific Twave abnormalities. Peak EKG revealed no significant ST-T change from baseline abnormality. Myocardial perfusion is normal. Mildly enlarged left ventricle. in both rest and stress. LV stress volume 170 ml. TID is normal. No stress lung uptake. Overall LV systolic function is low normal without regional wall motion  abnormalities. Stress  LV EF: 51%. Compared to 05/28/2013, no significant change. LV dilatation is new.  PCV ECHOCARDIOGRAM COMPLETE 11/08/2022  Narrative Echocardiogram 11/08/2022: Normal LV systolic function with visual EF 60-65%. Left ventricle cavity is normal in size. Normal global wall motion. Mild left ventricular hypertrophy. Unable to evaluate diastolic function due to atrial fibrillation. Left atrial cavity is moderately dilated at 46.76 ml/m^2. Aortic valve sclerosis without stenosis. Native mitral valve. Mitral annular calcification.  Mild (Grade I) mitral regurgitation. Moderate tricuspid regurgitation. Mild pulmonary hypertension. RVSP measures 39 mmHg. Mild pulmonic regurgitation. IVC is dilated with a respiratory response of >50%. Estimated right atrial pressure . Compared to 03/17/2020: LVEF improved from 40-45% to 60-65%, RWMA's not appreciated on current study, severe LAE is now moderate, mild PHTN is new w/ RVSP 39 mmHG.   EKG  EKG 09/11/2022: Atrial fibrillation with controlled ventricular response at the rate of 58 bpm, normal axis, IRBBB, no evidence of ischemia.  No significant change from 03/13/2022.  Assessment   No diagnosis found.  CHA2DS2-VASc Score is 4.  Yearly risk of stroke: 4.8% (A, HTN, DM, Vasc Dz).  Score of 1=0.6; 2=2.2; 3=3.2; 4=4.8; 5=7.2; 6=9.8; 7=>9.8) -(CHF; HTN; vasc disease DM,  Male = 1; Age <65 =0; 65-74 = 1,  >75 =2; stroke/embolism= 2).    No orders of the defined types were placed in this encounter.  There are no discontinued medications.    Recommendations:   Ansen Ocasio  is a 72 y.o. 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  Since right BKA, patient states that he feels much better.  He has now started using prosthesis and is able to stand up with the help of walker walker and walk in the house without.  Due to development of ascites, heart failure question was raised and underwent echocardiogram and presents for follow-up.  1. Coronary artery disease involving native coronary artery of native heart without angina pectoris From  coronary disease standpoint, he remains stable without recurrence of angina pectoris.  2. Chronic diastolic heart failure (HCC) He has chronic diastolic heart failure.  No clinical evidence of acute decompensated heart failure.  Do not think his ascites is related to heart failure.  I extensively reviewed all his records from the hospital notes and suspect his ascites is related to hepatic cirrhosis and/or disorder. Echocardiogram reveals improvement in his LVEF.  No significant valvular abnormality.  3. Other ascites I am wondering whether we could add spironolactone although he has hemodialysis.  I have sent message to his nephrologist that I would like to initiate Demadex therapy to see whether we can induce diuresis he may need much higher dose.  He has an appointment to see Dr. Bufford Buttner (nephrology) and will further discuss regarding this.  I have also personally texted Dr. Signe Colt.  Otherwise stable from cardiac standpoint, I will see him back in 6 months for follow-up.    Yates Decamp, MD, [facility] 11/21/2022, 3:46 PM Office: [phone] Pager: [phone]

## 2022-11-22 ENCOUNTER — Ambulatory Visit: Payer: Medicare Other | Admitting: Cardiology

## 2022-11-25 ENCOUNTER — Encounter: Payer: Medicare Other | Admitting: Physical Therapy

## 2022-11-26 ENCOUNTER — Telehealth: Payer: Self-pay | Admitting: Orthopedic Surgery

## 2022-11-26 NOTE — Telephone Encounter (Signed)
Dr. Lajoyce Corners reviewed photo and spoke with pt and advised to go to [facility] to have [facility] adjusted.

## 2022-11-26 NOTE — Telephone Encounter (Signed)
I called pt and he states that he has a blister on his residual limb. He said there is some swelling. Offered appt today but the pt states that he just tested positive for Covid today. Pt will send picture for eval and will discuss with Dr. Lajoyce Corners.  Will hold this message and discuss treatment with pt once reviewed.

## 2022-11-26 NOTE — Telephone Encounter (Signed)
Pt called stating his right stump is really bothering him and need Dr Lajoyce Corners to take a look at it ASAP to make sure it's ok. Please call pt at [phone].

## 2022-11-27 ENCOUNTER — Encounter: Payer: Medicare Other | Admitting: Physical Therapy

## 2022-11-29 ENCOUNTER — Other Ambulatory Visit: Payer: Self-pay | Admitting: Cardiology

## 2022-11-29 DIAGNOSIS — R188 Other ascites: Secondary | ICD-10-CM

## 2022-12-03 ENCOUNTER — Ambulatory Visit (INDEPENDENT_AMBULATORY_CARE_PROVIDER_SITE_OTHER): Payer: Medicare Other | Admitting: Physical Therapy

## 2022-12-03 ENCOUNTER — Encounter: Payer: Self-pay | Admitting: Physical Therapy

## 2022-12-03 DIAGNOSIS — M6281 Muscle weakness (generalized): Secondary | ICD-10-CM | POA: Diagnosis not present

## 2022-12-03 DIAGNOSIS — R2681 Unsteadiness on feet: Secondary | ICD-10-CM | POA: Diagnosis not present

## 2022-12-03 DIAGNOSIS — L98498 Non-pressure chronic ulcer of skin of other sites with other specified severity: Secondary | ICD-10-CM

## 2022-12-03 DIAGNOSIS — R293 Abnormal posture: Secondary | ICD-10-CM

## 2022-12-03 DIAGNOSIS — R2689 Other abnormalities of gait and mobility: Secondary | ICD-10-CM

## 2022-12-03 NOTE — Therapy (Signed)
OUTPATIENT PHYSICAL THERAPY PROSTHETIC TREATMENT   Patient Name: [name] MRN: [id number] DOB:1951/01/28, 72 y.o., male Today's Date: 12/03/2022  PCP: Adolph Pollack, FNP REFERRING PROVIDER: Aldean Baker, MD  END OF SESSION:  PT End of Session - 12/03/22 1350     Visit Number 5    Number of Visits 25    Date for PT Re-Evaluation 01/30/23    Authorization Type Medicare & BCBS supplement    Progress Note Due on Visit 10    PT Start Time 1348    PT Stop Time 1430    PT Time Calculation (min) 42 min    Equipment Utilized During Treatment Gait belt    Activity Tolerance Patient tolerated treatment well    Behavior During Therapy WFL for tasks assessed/performed                 Past Medical History:  Diagnosis Date   Acute osteomyelitis of metatarsal bone of left foot (HCC)    Ambulates with cane    straight cane   Anemia    Cervical myelopathy (HCC) 02/06/2018   Chronic kidney disease    dailysis M W F- home   Community acquired pneumonia of left lower lobe of lung 08/18/2021   Complication of anesthesia    Coronary artery disease    Dehiscence of amputation stump of left lower extremity (HCC)    Diabetes mellitus without complication (HCC)    type2   Diabetic foot ulcer (HCC)    Diabetic neuropathy (HCC) 02/06/2018   Dysrhythmia    Gait abnormality 08/24/2018   GERD (gastroesophageal reflux disease)     01/06/20- not current   Gout    History of blood transfusion    History of blood transfusion    History of kidney stones    passed stones   Hypercholesteremia    Hypertension    Hypothyroidism    Neuromuscular disorder (HCC)    neuropathy left leg and bilateral feet   Neuropathy    Partial nontraumatic amputation of foot, 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   PONV (postoperative nausea and vomiting)    Prostate cancer (HCC)    PVD (peripheral vascular disease) (HCC)    with amputations   Past Surgical History:  Procedure Laterality Date   A-FLUTTER  ABLATION N/A 04/06/2020   Procedure: A-FLUTTER ABLATION;  Surgeon: Marinus Maw, MD;  Location: [facility];  Service: Cardiovascular;  Laterality: N/A;   AMPUTATION Left 12/25/2013   Procedure: AMPUTATION RAY LEFT 5TH RAY;  Surgeon: Nadara Mustard, MD;  Location: [facility] ORS;  Service: Orthopedics;  Laterality: Left;   AMPUTATION Right 12/15/2018   Procedure: AMPUTATION OF 4TH AND 5TH TOES RIGHT FOOT;  Surgeon: Nadara Mustard, MD;  Location: [facility] OR;  Service: Orthopedics;  Laterality: Right;   AMPUTATION Left 02/02/2021   Procedure: LEFT FOOT 4TH RAY AMPUTATION;  Surgeon: Nadara Mustard, MD;  Location: [facility] OR;  Service: Orthopedics;  Laterality: Left;   AMPUTATION Left 05/09/2021   Procedure: LEFT BELOW KNEE AMPUTATION;  Surgeon: Nadara Mustard, MD;  Location: [facility] OR;  Service: Orthopedics;  Laterality: Left;   AMPUTATION Right 07/26/2022   Procedure: RIGHT BELOW KNEE AMPUTATION;  Surgeon: Nadara Mustard, MD;  Location: [facility] OR;  Service: Orthopedics;  Laterality: Right;   APPLICATION OF WOUND VAC Right 12/15/2018   Procedure: APPLICATION OF WOUND VAC;  Surgeon: Nadara Mustard, MD;  Location: [facility] OR;  Service: Orthopedics;  Laterality: Right;   AV FISTULA  PLACEMENT Left 03/25/2019   Procedure: LEFT ARM ARTERIOVENOUS (AV) FISTULA CREATION;  Surgeon: Nada Libman, MD;  Location: [facility] OR;  Service: Vascular;  Laterality: Left;   BACK SURGERY     BASCILIC VEIN TRANSPOSITION Left 05/20/2019   Procedure: SECOND STAGE LEFT BASCILIC VEIN TRANSPOSITION;  Surgeon: Nada Libman, MD;  Location: [facility] OR;  Service: Vascular;  Laterality: Left;   BIOPSY  06/22/2022   Procedure: BIOPSY;  Surgeon: Lemar Lofty., MD;  Location: [facility] ENDOSCOPY;  Service: Gastroenterology;;   CARDIAC CATHETERIZATION  02/17/2014   CHOLECYSTECTOMY     COLONOSCOPY  2011   in [place], Normal   COLONOSCOPY N/A 06/23/2022   Procedure: COLONOSCOPY;  Surgeon: Sherrilyn Rist, MD;  Location: [facility] ENDOSCOPY;  Service:  Gastroenterology;  Laterality: N/A;   CORONARY ARTERY BYPASS GRAFT  2008   CYSTOSCOPY N/A 06/29/2020   Procedure: CYSTOSCOPY WITH CLOT EVACUATION AND  FULGERATION;  Surgeon: Marcine Matar, MD;  Location: [facility] OR;  Service: Urology;  Laterality: N/A;   CYSTOSCOPY WITH FULGERATION Bilateral 06/17/2020   Procedure: CYSTOSCOPY,BILATERAL RETROGRADE, CLOT EVACUATION WITH FULGERATION OF THE BLADDER;  Surgeon: Jannifer Hick, MD;  Location: [facility] OR;  Service: Urology;  Laterality: Bilateral;   CYSTOSCOPY WITH FULGERATION N/A 06/28/2020   Procedure: CYSTOSCOPY WITH CLOT EVACUATION AND FULGERATION OF BLEEDERS;  Surgeon: Marcine Matar, MD;  Location: [facility] OR;  Service: Urology;  Laterality: N/A;  1 HR   ENTEROSCOPY N/A 06/22/2022   Procedure: ENTEROSCOPY;  Surgeon: Meridee Score Netty Starring., MD;  Location: [facility] ENDOSCOPY;  Service: Gastroenterology;  Laterality: N/A;   HEMOSTASIS CLIP PLACEMENT  06/22/2022   Procedure: HEMOSTASIS CLIP PLACEMENT;  Surgeon: Lemar Lofty., MD;  Location: [facility] ENDOSCOPY;  Service: Gastroenterology;;   HOT HEMOSTASIS N/A 06/22/2022   Procedure: HOT HEMOSTASIS (ARGON PLASMA COAGULATION/BICAP);  Surgeon: Lemar Lofty., MD;  Location: [facility] ENDOSCOPY;  Service: Gastroenterology;  Laterality: N/A;   I & D EXTREMITY Right 12/15/2018   Procedure: DEBRIDEMENT RIGHT FOOT;  Surgeon: Nadara Mustard, MD;  Location: [facility] OR;  Service: Orthopedics;  Laterality: Right;   I & D EXTREMITY Right 08/27/2019   Procedure: PARTIAL CUBOID EXCISION RIGHT FOOT;  Surgeon: Nadara Mustard, MD;  Location: [facility] OR;  Service: Orthopedics;  Laterality: Right;   I & D EXTREMITY Right 01/07/2020   Procedure: RIGHT FOOT EXCISION INFECTED BONE;  Surgeon: Nadara Mustard, MD;  Location: [facility] OR;  Service: Orthopedics;  Laterality: Right;   I & D EXTREMITY Right 05/17/2022   Procedure: IRRIGATION AND DEBRIDEMENT RIGHT FOOT ABSCESS;  Surgeon: Nadara Mustard, MD;  Location: [facility] OR;  Service: Orthopedics;  Laterality:  Right;   IR PARACENTESIS  10/11/2022   NECK SURGERY     novemver 2019   POLYPECTOMY  06/22/2022   Procedure: POLYPECTOMY;  Surgeon: Meridee Score Netty Starring., MD;  Location: [facility] ENDOSCOPY;  Service: Gastroenterology;;   POLYPECTOMY  06/23/2022   Procedure: POLYPECTOMY;  Surgeon: Sherrilyn Rist, MD;  Location: [facility] ENDOSCOPY;  Service: Gastroenterology;;   STUMP REVISION Left 06/27/2021   Procedure: REVISION LEFT BELOW KNEE AMPUTATION;  Surgeon: Nadara Mustard, MD;  Location: [facility] OR;  Service: Orthopedics;  Laterality: Left;   SUBMUCOSAL LIFTING INJECTION  06/22/2022   Procedure: SUBMUCOSAL LIFTING INJECTION;  Surgeon: Meridee Score Netty Starring., MD;  Location: [facility] ENDOSCOPY;  Service: Gastroenterology;;   SUBMUCOSAL TATTOO INJECTION  06/22/2022   Procedure: SUBMUCOSAL TATTOO INJECTION;  Surgeon: Lemar Lofty., MD;  Location: [facility] ENDOSCOPY;  Service: Gastroenterology;;  TRANSURETHRAL RESECTION OF PROSTATE N/A 06/28/2020   Procedure: TRANSURETHRAL RESECTION OF THE PROSTATE (TURP);  Surgeon: Marcine Matar, MD;  Location: [facility] OR;  Service: Urology;  Laterality: N/A;   WISDOM TOOTH EXTRACTION     Patient Active Problem List   Diagnosis Date Noted   Right below-knee amputee (HCC) 07/29/2022   Severe sepsis (HCC) 07/23/2022   Foot infection 07/22/2022   Acute respiratory failure (HCC) 07/22/2022   Anemia due to chronic blood loss 06/23/2022   Heme positive stool 06/23/2022   Benign neoplasm of cecum 06/23/2022   Benign neoplasm of transverse colon 06/23/2022   Benign neoplasm of descending colon 06/23/2022   Abscess of right foot 05/17/2022   Contraindication to anticoagulation therapy GU Bleed 03/13/2022   Paroxysmal atrial fibrillation (HCC) 08/18/2021   Anemia of chronic renal failure 08/18/2021   Thrombocytopenia (HCC) 08/18/2021   Acute metabolic encephalopathy 08/18/2021   Action induced myoclonus 08/18/2021   Left below-knee amputee (HCC) 05/11/2021   Symptomatic anemia  09/13/2020   COVID-19 virus infection 09/13/2020   Pressure injury of skin 06/29/2020   Gross hematuria 06/16/2020   Typical atrial flutter (HCC) 03/24/2020   Bilateral pleural effusion 02/24/2020   Healthcare maintenance 01/28/2020   Shortness of breath 01/28/2020   History of partial ray amputation of fourth toe of right foot (HCC) 09/08/2019   Chronic cough 06/25/2019   Cutaneous abscess of right foot    Subacute osteomyelitis, right ankle and foot (HCC) 12/14/2018   AKI (acute kidney injury) (HCC) 12/14/2018   ESRD on dialysis (HCC) 12/14/2018   S/P CABG (coronary artery bypass graft) 12/11/2018   Gait abnormality 08/24/2018   Diabetic neuropathy (HCC) 02/06/2018   Cervical myelopathy (HCC) 02/06/2018   Onychomycosis 10/30/2017   Other spondylosis with radiculopathy, cervical region 01/27/2017   Midfoot ulcer, right, limited to breakdown of skin (HCC) 11/15/2016   Lateral epicondylitis, left elbow 08/12/2016   Prostate cancer (HCC) 06/19/2016   Cellulitis of right foot 12/24/2015   Gout 09/14/2012   Hypertension associated with diabetes (HCC) 09/14/2012   Hypothyroidism 09/14/2012   CAD (coronary artery disease) 09/14/2012   Insulin dependent type 2 diabetes mellitus (HCC) 09/14/2012   Hyperlipidemia associated with type 2 diabetes mellitus (HCC)    Neuropathy (HCC)     ONSET DATE: 10/31/2022 right BKA prosthesis delivery  REFERRING DIAG:  Z36.644I (ICD-10-CM) - Below-knee amputation of left lower extremity, initial encounter (HCC)  H47.425Z (ICD-10-CM) - Below-knee amputation of right lower extremity, initial encounter (HCC)   THERAPY DIAG:  Unsteadiness on feet  Other abnormalities of gait and mobility  Muscle weakness (generalized)  Abnormal posture  Non-pressure chronic ulcer of skin of other sites with other specified severity [facility])  Rationale for Evaluation and Treatment: Rehabilitation  SUBJECTIVE:   SUBJECTIVE STATEMENT: His wife got COVID then he  tested positive on 8/17 but had no symptoms.  He fell out of w/c when w/c missed ramp over step in sunken den.  He denies injuries.  He got 3 blisters on right leg on 8/16 and prosthetist on 8/23 who opened socket up.  He continues to wear prosthesis most of day but removes occasionally during day.   PERTINENT HISTORY: ESRD on home dialysis, HTN, HLD, CAD, DM2, PVD, prostate CA, cervical myelopathy,    PAIN:  Are you having pain? No  PRECAUTIONS: Fall and Other: No BP LUE  WEIGHT BEARING RESTRICTIONS: No  FALLS: Has patient fallen in last 6 months? Yes. Number of falls 1 his left prosthesis slipped off &  denies injuries.   LIVING ENVIRONMENT: Lives with: lives with their spouse and CNA Mon-Fri 1:00-5:00 Lives in: House  Home Access: Ramped entrance Home layout:  3 level enters on middle floor which has bedroom & full bath. Stairs: downstairs has chair lift.  Has following equipment at home: Single point cane, Walker - 2 wheeled, Environmental consultant - 4 wheeled, Wheelchair (manual), [employer], Grab bars, and Ramped entry  OCCUPATION: retired  PLOF: he was walking with RW with left prosthesis.    PATIENT GOALS:   to use 2 prostheses to walk with cane. He wants to bring garbage cans up sloped driveway.    OBJECTIVE:  COGNITION: Eval on 11/06/2022:  Overall cognitive status: Within functional limits for tasks assessed  POSTURE: Eval on 11/06/2022: rounded shoulders, forward head, flexed trunk , and weight shift left  LOWER EXTREMITY ROM:  ROM P:passive  A:active Right eval  Hip flexion   Hip extension   Hip abduction   Hip adduction   Hip internal rotation   Hip external rotation   Knee flexion   Knee extension seated P: -6*  Ankle dorsiflexion   Ankle plantarflexion   Ankle inversion   Ankle eversion    (Blank rows = not tested)  LOWER EXTREMITY MMT:  MMT Right eval Left eval  Hip flexion 4/5 4/5  Hip extension 3/5 3+5  Hip abduction 35 3+5  Hip adduction    Hip  internal rotation    Hip external rotation    Knee flexion 3+/5 4/5  Knee extension 3+/5 4/5  Eval MMT gross seated & functional  (Blank rows = not tested)  TRANSFERS: Eval on 11/06/2022: Sit to stand: SBA 22" w/c using BUEs on armrests requires RW for stabilization Stand to sit: SBA RW for stabilization to 22" w/c using BUEs on armrests  GAIT: Eval on 11/06/2022: Gait pattern: step to pattern, decreased step length- Left, decreased stance time- Right, Right hip hike, Left hip hike, knee flexed in stance- Right, lateral hip instability, trunk flexed, and wide BOS Distance walked: 25' turning 180* CW & CCW Assistive device utilized: Environmental consultant - 2 wheeled and bilateral TTA prostheses Level of assistance: Min A  FUNCTIONAL TESTs:  Eval on 11/06/2022: Standing balance with RW support with close supervision: reaches 5" anteriorly, looks to side only when looking over shoulders and reaches towards floor within 10".    CURRENT PROSTHETIC WEAR ASSESSMENT: 11/06/2022:  Patient is independent with: prosthetic cleaning and ply sock cleaning Patient is dependent with: skin check, residual limb care, proper wear schedule/adjustment, and proper weight-bearing schedule/adjustment Donning prosthesis: SBA / verbal cues Doffing prosthesis: Modified independence Prosthetic wear tolerance: pt reports wearing left prosthesis daily all awake hours for several months.  Right prosthesis has worn daily for 6 days since receiving it for most of awake hours.  Prosthetic weight bearing tolerance: 5 minutes with no c/o pain Edema: right limb has pitting edema with 5 sec capillary refill.  Left limb has no edema noted. Residual limb condition: Left limb has superficial wound 5mm with white perimeter from excessive fluid & CNA reports some drainage;  Right limb has no open areas, distal area has redness & shiny skin, normal temperature, no hair growth.  Prosthetic description: silicon liners (9mm over tibia & 6mm  posterior portion) with pin lock suspension, socket with ratchet suspension, flexible Keel feet     TODAY'S TREATMENT:  DATE:  12/03/2022: Prosthetic Training with Transtibial Prostheses: Patient has 4 wounds on right limb: one over patella, one at distal tibia and two posterior over gastroc belly.  No signs of infection. He has been wearing Tegaderm over 3 wounds for 2 days since his wife left for [place] trip.  CNA present with pt.  Tegaderm appears to keeping wounds too moist.  PT recommended removing Tegaderm at night and reapplying in morning.  He needs to use a portion of bandaid at corner of Tegaderm so pt can remove himself at night. PT demo this. Pt & CNA verbalized understanding.  Pt amb 80' X 2 with rollator walker with cues on upright posture / position of rollator walker and orienting patellas forward to minimize hip external rotation.  Pt amb 40' around square working on turning and maintaining rollator walker control.      11/20/2022: Therapeutic Exercise: Leg press BLEs 100# 15 reps; marching LEs 1x each during ext 75# 15 reps;  single LE 43# ea LE 10 reps 1 set, then 37# ea LE 10 reps 1 set; Pt & wife verbalized understanding as part of his gym workout.   Prosthetic Training with Transtibial Prostheses: LLE wound is now 2mm X 3mm with no signs of infection.  He can continue no using Tegaderm as long as wound continues to decrease in size.   RLE wound over patella 4mm X 5mm with no signs of infection covered with Tegaderm.  PT recommended continuing Tegaderm until it heals, then using baby oil to reduce friction of liner with knee flexion. Pt & wife verbalized understanding.  PT instructed in walking program to increase stamina and endurance - see HEP below.  He needs supervision for now. Pt & wife verbalized understanding.  PT demo & verbal cues how to  follow with w/c while he is ambulating and able to safely supervise.     8/12/024: Prosthetic Training with Transtibial Prostheses: PT recommended tightening straps during day as limb shrinks with intermittent weight bearing. Pt & wife verbalized understanding.  PT demo & verbal cues on rollator walker including sit/stand, ramp & curb.  Pt amb 50' including 180* turn with minA/CGA. Stand to sit on rollator seat with CGA / minA to stabilize walker and sit to stand from seat with minA including arising & stabilizing rollator walker.  Pt neg 6.5" curb with minA / CGA and 12* incline with maxA downhill including 2nd PT to control rollator / modA uphill.   Therapeutic Exercise:  Nustep seat 12 level 5 with BLEs & BUEs 8 min Step up 4" box with rollator walker BUE support 1 rep leading up & down LLE and 1 rep leading RLE. PT demo & verbal cues on technique.      HOME EXERCISE PROGRAM: Increasing your activity level is important.  Short distances which is walking from one room to another. Work to increase frequency back to prior level. Start with walk each hour during awake hours, then every 45-50 min, decreasing until he is moving some every 30 min during day   Medium distances are entering & exiting your home or community with limited distances. Start with 4 medium walks which is one outing to one location and increase number of tolerated amounts per day.  Long distance is your highest tolerance for you. Walk until you feel you must rest. Back or leg pain or general fatigue are indicators to maximum tolerance. Monitor by distance or time. Try to walk your BEST distance 1-2 times per  day. You should see this increase over time.      ASSESSMENT:  CLINICAL IMPRESSION: Patient has more wounds on left limb that will need close monitoring.  He improved his ability to negotiate around obstacles with PT instruction.   OBJECTIVE IMPAIRMENTS: Abnormal gait, decreased activity tolerance, decreased  balance, decreased endurance, decreased knowledge of use of DME, decreased mobility, difficulty walking, decreased ROM, decreased strength, increased edema, postural dysfunction, and prosthetic dependency .   ACTIVITY LIMITATIONS: carrying, standing, stairs, transfers, locomotion level, and standing ADLs  PARTICIPATION LIMITATIONS: community activity and household mobility  PERSONAL FACTORS: Age, Fitness, Past/current experiences, Time since onset of injury/illness/exacerbation, and 3+ comorbidities: see PMH  are also affecting patient's functional outcome.   REHAB POTENTIAL: Good  CLINICAL DECISION MAKING: Evolving/moderate complexity  EVALUATION COMPLEXITY: Moderate   GOALS: Goals reviewed with patient? Yes  SHORT TERM GOALS: Target date: 12/05/2022  Patient verbalizes proper skin care & checking limbs.  Baseline: SEE OBJECTIVE DATA Goal status: Ongoing 11/18/2022 2.  Patient tolerates prostheses >12 hrs total /day without skin issues or limb pain Baseline: SEE OBJECTIVE DATA Goal status: Ongoing 11/18/2022  3.  Patient able to reach 10" and look over both shoulders with single UE support on RW safely. Baseline: SEE OBJECTIVE DATA Goal status: Ongoing 11/18/2022  4. Patient ambulates 200' with RW & prostheses with supervision. Baseline: SEE OBJECTIVE DATA Goal status: Ongoing 11/18/2022  5. Patient negotiates ramps & curbs with RW & prostheses with minA. Baseline: SEE OBJECTIVE DATA Goal status: Ongoing 11/18/2022  LONG TERM GOALS: Target date: 01/30/2023  Patient demonstrates & verbalized understanding of prosthetic care to enable safe utilization of prosthesis. Baseline: SEE OBJECTIVE DATA Goal status:   Ongoing 11/18/2022  Patient tolerates prostheses wear >90% of awake hours without skin or limb pain issues. Baseline: SEE OBJECTIVE DATA Goal status: Ongoing 11/18/2022  Tasks of [employer] with RW support >/= 30/56 Baseline: SEE OBJECTIVE DATA Goal status: Ongoing  11/18/2022  Patient ambulates >300' with prosthesis & LRAD independently Baseline: SEE OBJECTIVE DATA Goal status: Ongoing 11/18/2022  Patient negotiates ramps, curbs & stairs with single rail with prostheses & LRAD modified independent. Baseline: SEE OBJECTIVE DATA Goal status: Ongoing 11/18/2022  Patient verbalizes & demonstrates understanding of how to properly use fitness equipment to return to gym. Baseline: SEE OBJECTIVE DATA Goal status:  Ongoing 11/18/2022  PLAN:  PT FREQUENCY: 2x/week  PT DURATION: 12 weeks  PLANNED INTERVENTIONS: Therapeutic exercises, Therapeutic activity, Neuromuscular re-education, Balance training, Gait training, Patient/Family education, Self Care, Stair training, Prosthetic training, DME instructions, Aquatic Therapy, Re-evaluation, and physical performance testing  PLAN FOR NEXT SESSION: check STGs,  continue prosthetic gait with rollator walker. Balance activities. Instruct in hamstring & gastroc stretches.    Vladimir Faster, PT, DPT 12/03/2022, 5:13 PM

## 2022-12-04 ENCOUNTER — Ambulatory Visit (INDEPENDENT_AMBULATORY_CARE_PROVIDER_SITE_OTHER): Payer: Medicare Other | Admitting: Physical Therapy

## 2022-12-04 ENCOUNTER — Encounter: Payer: Self-pay | Admitting: Physical Therapy

## 2022-12-04 DIAGNOSIS — R2681 Unsteadiness on feet: Secondary | ICD-10-CM

## 2022-12-04 DIAGNOSIS — M6281 Muscle weakness (generalized): Secondary | ICD-10-CM

## 2022-12-04 DIAGNOSIS — Z9181 History of falling: Secondary | ICD-10-CM

## 2022-12-04 DIAGNOSIS — Z7409 Other reduced mobility: Secondary | ICD-10-CM

## 2022-12-04 DIAGNOSIS — R293 Abnormal posture: Secondary | ICD-10-CM

## 2022-12-04 DIAGNOSIS — R2689 Other abnormalities of gait and mobility: Secondary | ICD-10-CM | POA: Diagnosis not present

## 2022-12-04 DIAGNOSIS — L98498 Non-pressure chronic ulcer of skin of other sites with other specified severity: Secondary | ICD-10-CM

## 2022-12-04 NOTE — Therapy (Signed)
OUTPATIENT PHYSICAL THERAPY PROSTHETIC TREATMENT   Patient Name: [name] MRN: [id number] DOB:02-05-1951, 72 y.o., male Today's Date: 12/04/2022  PCP: Adolph Pollack, FNP REFERRING PROVIDER: Aldean Baker, MD  END OF SESSION:  PT End of Session - 12/04/22 1514     Visit Number 6    Number of Visits 25    Date for PT Re-Evaluation 01/30/23    Authorization Type Medicare & BCBS supplement    Progress Note Due on Visit 10    PT Start Time 1514    PT Stop Time 1600    PT Time Calculation (min) 46 min    Equipment Utilized During Treatment Gait belt    Activity Tolerance Patient tolerated treatment well    Behavior During Therapy WFL for tasks assessed/performed                  Past Medical History:  Diagnosis Date   Acute osteomyelitis of metatarsal bone of left foot (HCC)    Ambulates with cane    straight cane   Anemia    Cervical myelopathy (HCC) 02/06/2018   Chronic kidney disease    dailysis M W F- home   Community acquired pneumonia of left lower lobe of lung 08/18/2021   Complication of anesthesia    Coronary artery disease    Dehiscence of amputation stump of left lower extremity (HCC)    Diabetes mellitus without complication (HCC)    type2   Diabetic foot ulcer (HCC)    Diabetic neuropathy (HCC) 02/06/2018   Dysrhythmia    Gait abnormality 08/24/2018   GERD (gastroesophageal reflux disease)     01/06/20- not current   Gout    History of blood transfusion    History of blood transfusion    History of kidney stones    passed stones   Hypercholesteremia    Hypertension    Hypothyroidism    Neuromuscular disorder (HCC)    neuropathy left leg and bilateral feet   Neuropathy    Partial nontraumatic amputation of foot, 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   PONV (postoperative nausea and vomiting)    Prostate cancer (HCC)    PVD (peripheral vascular disease) (HCC)    with amputations   Past Surgical History:  Procedure Laterality Date   A-FLUTTER  ABLATION N/A 04/06/2020   Procedure: A-FLUTTER ABLATION;  Surgeon: Marinus Maw, MD;  Location: [facility] INVASIVE [facility] LAB;  Service: Cardiovascular;  Laterality: N/A;   AMPUTATION Left 12/25/2013   Procedure: AMPUTATION RAY LEFT 5TH RAY;  Surgeon: Nadara Mustard, MD;  Location: [facility] ORS;  Service: Orthopedics;  Laterality: Left;   AMPUTATION Right 12/15/2018   Procedure: AMPUTATION OF 4TH AND 5TH TOES RIGHT FOOT;  Surgeon: Nadara Mustard, MD;  Location: [facility] OR;  Service: Orthopedics;  Laterality: Right;   AMPUTATION Left 02/02/2021   Procedure: LEFT FOOT 4TH RAY AMPUTATION;  Surgeon: Nadara Mustard, MD;  Location: [facility] OR;  Service: Orthopedics;  Laterality: Left;   AMPUTATION Left 05/09/2021   Procedure: LEFT BELOW KNEE AMPUTATION;  Surgeon: Nadara Mustard, MD;  Location: [facility] OR;  Service: Orthopedics;  Laterality: Left;   AMPUTATION Right 07/26/2022   Procedure: RIGHT BELOW KNEE AMPUTATION;  Surgeon: Nadara Mustard, MD;  Location: [facility] OR;  Service: Orthopedics;  Laterality: Right;   APPLICATION OF WOUND VAC Right 12/15/2018   Procedure: APPLICATION OF WOUND VAC;  Surgeon: Nadara Mustard, MD;  Location: [facility] OR;  Service: Orthopedics;  Laterality: Right;   AV  FISTULA PLACEMENT Left 03/25/2019   Procedure: LEFT ARM ARTERIOVENOUS (AV) FISTULA CREATION;  Surgeon: Nada Libman, MD;  Location: [facility] OR;  Service: Vascular;  Laterality: Left;   BACK SURGERY     BASCILIC VEIN TRANSPOSITION Left 05/20/2019   Procedure: SECOND STAGE LEFT BASCILIC VEIN TRANSPOSITION;  Surgeon: Nada Libman, MD;  Location: [facility] OR;  Service: Vascular;  Laterality: Left;   BIOPSY  06/22/2022   Procedure: BIOPSY;  Surgeon: Lemar Lofty., MD;  Location: [facility] ENDOSCOPY;  Service: Gastroenterology;;   CARDIAC CATHETERIZATION  02/17/2014   CHOLECYSTECTOMY     COLONOSCOPY  2011   in [place], Normal   COLONOSCOPY N/A 06/23/2022   Procedure: COLONOSCOPY;  Surgeon: Sherrilyn Rist, MD;  Location: [facility] ENDOSCOPY;  Service:  Gastroenterology;  Laterality: N/A;   CORONARY ARTERY BYPASS GRAFT  2008   CYSTOSCOPY N/A 06/29/2020   Procedure: CYSTOSCOPY WITH CLOT EVACUATION AND  FULGERATION;  Surgeon: Marcine Matar, MD;  Location: [facility] OR;  Service: Urology;  Laterality: N/A;   CYSTOSCOPY WITH FULGERATION Bilateral 06/17/2020   Procedure: CYSTOSCOPY,BILATERAL RETROGRADE, CLOT EVACUATION WITH FULGERATION OF THE BLADDER;  Surgeon: Jannifer Hick, MD;  Location: [facility] OR;  Service: Urology;  Laterality: Bilateral;   CYSTOSCOPY WITH FULGERATION N/A 06/28/2020   Procedure: CYSTOSCOPY WITH CLOT EVACUATION AND FULGERATION OF BLEEDERS;  Surgeon: Marcine Matar, MD;  Location: [facility] OR;  Service: Urology;  Laterality: N/A;  1 HR   ENTEROSCOPY N/A 06/22/2022   Procedure: ENTEROSCOPY;  Surgeon: Meridee Score Netty Starring., MD;  Location: [facility] ENDOSCOPY;  Service: Gastroenterology;  Laterality: N/A;   HEMOSTASIS CLIP PLACEMENT  06/22/2022   Procedure: HEMOSTASIS CLIP PLACEMENT;  Surgeon: Lemar Lofty., MD;  Location: [facility] ENDOSCOPY;  Service: Gastroenterology;;   HOT HEMOSTASIS N/A 06/22/2022   Procedure: HOT HEMOSTASIS (ARGON PLASMA COAGULATION/BICAP);  Surgeon: Lemar Lofty., MD;  Location: [facility] ENDOSCOPY;  Service: Gastroenterology;  Laterality: N/A;   I & D EXTREMITY Right 12/15/2018   Procedure: DEBRIDEMENT RIGHT FOOT;  Surgeon: Nadara Mustard, MD;  Location: [facility] OR;  Service: Orthopedics;  Laterality: Right;   I & D EXTREMITY Right 08/27/2019   Procedure: PARTIAL CUBOID EXCISION RIGHT FOOT;  Surgeon: Nadara Mustard, MD;  Location: [facility] OR;  Service: Orthopedics;  Laterality: Right;   I & D EXTREMITY Right 01/07/2020   Procedure: RIGHT FOOT EXCISION INFECTED BONE;  Surgeon: Nadara Mustard, MD;  Location: [facility] OR;  Service: Orthopedics;  Laterality: Right;   I & D EXTREMITY Right 05/17/2022   Procedure: IRRIGATION AND DEBRIDEMENT RIGHT FOOT ABSCESS;  Surgeon: Nadara Mustard, MD;  Location: [facility] OR;  Service: Orthopedics;  Laterality:  Right;   IR PARACENTESIS  10/11/2022   NECK SURGERY     novemver 2019   POLYPECTOMY  06/22/2022   Procedure: POLYPECTOMY;  Surgeon: Meridee Score Netty Starring., MD;  Location: [facility] ENDOSCOPY;  Service: Gastroenterology;;   POLYPECTOMY  06/23/2022   Procedure: POLYPECTOMY;  Surgeon: Sherrilyn Rist, MD;  Location: [facility] ENDOSCOPY;  Service: Gastroenterology;;   STUMP REVISION Left 06/27/2021   Procedure: REVISION LEFT BELOW KNEE AMPUTATION;  Surgeon: Nadara Mustard, MD;  Location: [facility] OR;  Service: Orthopedics;  Laterality: Left;   SUBMUCOSAL LIFTING INJECTION  06/22/2022   Procedure: SUBMUCOSAL LIFTING INJECTION;  Surgeon: Meridee Score Netty Starring., MD;  Location: [facility] ENDOSCOPY;  Service: Gastroenterology;;   SUBMUCOSAL TATTOO INJECTION  06/22/2022   Procedure: SUBMUCOSAL TATTOO INJECTION;  Surgeon: Lemar Lofty., MD;  Location: [facility] ENDOSCOPY;  Service: Gastroenterology;;  TRANSURETHRAL RESECTION OF PROSTATE N/A 06/28/2020   Procedure: TRANSURETHRAL RESECTION OF THE PROSTATE (TURP);  Surgeon: Marcine Matar, MD;  Location: [facility] OR;  Service: Urology;  Laterality: N/A;   WISDOM TOOTH EXTRACTION     Patient Active Problem List   Diagnosis Date Noted   Right below-knee amputee (HCC) 07/29/2022   Severe sepsis (HCC) 07/23/2022   Foot infection 07/22/2022   Acute respiratory failure (HCC) 07/22/2022   Anemia due to chronic blood loss 06/23/2022   Heme positive stool 06/23/2022   Benign neoplasm of cecum 06/23/2022   Benign neoplasm of transverse colon 06/23/2022   Benign neoplasm of descending colon 06/23/2022   Abscess of right foot 05/17/2022   Contraindication to anticoagulation therapy GU Bleed 03/13/2022   Paroxysmal atrial fibrillation (HCC) 08/18/2021   Anemia of chronic renal failure 08/18/2021   Thrombocytopenia (HCC) 08/18/2021   Acute metabolic encephalopathy 08/18/2021   Action induced myoclonus 08/18/2021   Left below-knee amputee (HCC) 05/11/2021   Symptomatic anemia  09/13/2020   COVID-19 virus infection 09/13/2020   Pressure injury of skin 06/29/2020   Gross hematuria 06/16/2020   Typical atrial flutter (HCC) 03/24/2020   Bilateral pleural effusion 02/24/2020   Healthcare maintenance 01/28/2020   Shortness of breath 01/28/2020   History of partial ray amputation of fourth toe of right foot (HCC) 09/08/2019   Chronic cough 06/25/2019   Cutaneous abscess of right foot    Subacute osteomyelitis, right ankle and foot (HCC) 12/14/2018   AKI (acute kidney injury) (HCC) 12/14/2018   ESRD on dialysis (HCC) 12/14/2018   S/P CABG (coronary artery bypass graft) 12/11/2018   Gait abnormality 08/24/2018   Diabetic neuropathy (HCC) 02/06/2018   Cervical myelopathy (HCC) 02/06/2018   Onychomycosis 10/30/2017   Other spondylosis with radiculopathy, cervical region 01/27/2017   Midfoot ulcer, right, limited to breakdown of skin (HCC) 11/15/2016   Lateral epicondylitis, left elbow 08/12/2016   Prostate cancer (HCC) 06/19/2016   Cellulitis of right foot 12/24/2015   Gout 09/14/2012   Hypertension associated with diabetes (HCC) 09/14/2012   Hypothyroidism 09/14/2012   CAD (coronary artery disease) 09/14/2012   Insulin dependent type 2 diabetes mellitus (HCC) 09/14/2012   Hyperlipidemia associated with type 2 diabetes mellitus (HCC)    Neuropathy (HCC)     ONSET DATE: 10/31/2022 right BKA prosthesis delivery  REFERRING DIAG:  Z61.096E (ICD-10-CM) - Below-knee amputation of left lower extremity, initial encounter (HCC)  A54.098J (ICD-10-CM) - Below-knee amputation of right lower extremity, initial encounter (HCC)   THERAPY DIAG:  Unsteadiness on feet  Other abnormalities of gait and mobility  Muscle weakness (generalized)  Abnormal posture  Non-pressure chronic ulcer of skin of other sites with other specified severity (HCC)  History of falling  Impaired functional mobility, balance, gait, and endurance  Rationale for Evaluation and Treatment:  Rehabilitation  SUBJECTIVE:   SUBJECTIVE STATEMENT:  He took Tegaderm off this morning and wore prostheses for 6 hours without it until CNA got there.  They feel better today.  He is doing dialysis at [facility] on Mon, Thurs & Fri while wife is in [place].    PERTINENT HISTORY: ESRD on home dialysis, HTN, HLD, CAD, DM2, PVD, prostate CA, cervical myelopathy,    PAIN:  Are you having pain? No  PRECAUTIONS: Fall and Other: No BP LUE  WEIGHT BEARING RESTRICTIONS: No  FALLS: Has patient fallen in last 6 months? Yes. Number of falls 1 his left prosthesis slipped off & denies injuries.   LIVING ENVIRONMENT: Lives with: lives with  their spouse and CNA Mon-Fri 1:00-5:00 Lives in: House  Home Access: Ramped entrance Home layout:  3 level enters on middle floor which has bedroom & full bath. Stairs: downstairs has chair lift.  Has following equipment at home: Single point cane, Walker - 2 wheeled, Environmental consultant - 4 wheeled, Wheelchair (manual), [employer], Grab bars, and Ramped entry  OCCUPATION: retired  PLOF: he was walking with RW with left prosthesis.    PATIENT GOALS:   to use 2 prostheses to walk with cane. He wants to bring garbage cans up sloped driveway.    OBJECTIVE:  COGNITION: Eval on 11/06/2022:  Overall cognitive status: Within functional limits for tasks assessed  POSTURE: Eval on 11/06/2022: rounded shoulders, forward head, flexed trunk , and weight shift left  LOWER EXTREMITY ROM:  ROM P:passive  A:active Right eval  Hip flexion   Hip extension   Hip abduction   Hip adduction   Hip internal rotation   Hip external rotation   Knee flexion   Knee extension seated P: -6*  Ankle dorsiflexion   Ankle plantarflexion   Ankle inversion   Ankle eversion    (Blank rows = not tested)  LOWER EXTREMITY MMT:  MMT Right eval Left eval  Hip flexion 4/5 4/5  Hip extension 3/5 3+5  Hip abduction 35 3+5  Hip adduction    Hip internal rotation    Hip external rotation    Knee  flexion 3+/5 4/5  Knee extension 3+/5 4/5  Eval MMT gross seated & functional  (Blank rows = not tested)  TRANSFERS: Eval on 11/06/2022: Sit to stand: SBA 22" w/c using BUEs on armrests requires RW for stabilization Stand to sit: SBA RW for stabilization to 22" w/c using BUEs on armrests  GAIT: Eval on 11/06/2022: Gait pattern: step to pattern, decreased step length- Left, decreased stance time- Right, Right hip hike, Left hip hike, knee flexed in stance- Right, lateral hip instability, trunk flexed, and wide BOS Distance walked: 25' turning 180* CW & CCW Assistive device utilized: Environmental consultant - 2 wheeled and bilateral TTA prostheses Level of assistance: Min A  FUNCTIONAL TESTs:  Eval on 11/06/2022: Standing balance with RW support with close supervision: reaches 5" anteriorly, looks to side only when looking over shoulders and reaches towards floor within 10".    CURRENT PROSTHETIC WEAR ASSESSMENT: 11/06/2022:  Patient is independent with: prosthetic cleaning and ply sock cleaning Patient is dependent with: skin check, residual limb care, proper wear schedule/adjustment, and proper weight-bearing schedule/adjustment Donning prosthesis: SBA / verbal cues Doffing prosthesis: Modified independence Prosthetic wear tolerance: pt reports wearing left prosthesis daily all awake hours for several months.  Right prosthesis has worn daily for 6 days since receiving it for most of awake hours.  Prosthetic weight bearing tolerance: 5 minutes with no c/o pain Edema: right limb has pitting edema with 5 sec capillary refill.  Left limb has no edema noted. Residual limb condition: Left limb has superficial wound 5mm with white perimeter from excessive fluid & CNA reports some drainage;  Right limb has no open areas, distal area has redness & shiny skin, normal temperature, no hair growth.  Prosthetic description: silicon liners (9mm over tibia & 6mm posterior portion) with pin lock suspension, socket with  ratchet suspension, flexible Keel feet     TODAY'S TREATMENT:  DATE:  12/04/2022: Prosthetic Training with Transtibial Prostheses: PT reviewed recommendation to remove Tegaderm in evenings when done with the prostheses for the day and to wash / rinse them with soap and water.  He needs to have Tegaderm reapplied in the morning prior to donning prostheses.  Patient and CNA verbalized understanding. PT reviewed donning prostheses including pen alignment with donning liner, sliding forward at an angle to reduced chair resistance with posterior thigh and angling the prosthesis so that the pin and hole are at the same angle.  Patient and CNA verbalized better understanding.  Patient able to don prosthesis with less resistance. Patient ambulated 80 feet x 2 with rollator walker with cues on upright posture / position of rollator walker and orienting patellas forward to minimize hip external rotation.  Standing with posterior pelvis to sink flexing forward to place hands on block rollator seat back to upright with focus on posture and back extension.  Patient performed 2 reps leading down and up with left upper extremity and 2 reps with right upper extremity with CGA. Patient able to turn 180 degrees to sit on rollator seat with PT stabilizing rollator walker and minA.    12/03/2022: Prosthetic Training with Transtibial Prostheses: Patient has 4 wounds on right limb: one over patella, one at distal tibia and two posterior over gastroc belly.  No signs of infection. He has been wearing Tegaderm over 3 wounds for 2 days since his wife left for [place] trip.  CNA present with pt.  Tegaderm appears to keeping wounds too moist.  PT recommended removing Tegaderm at night and reapplying in morning.  He needs to use a portion of bandaid at corner of Tegaderm so pt can remove himself at night.  PT demo this. Pt & CNA verbalized understanding.  Pt amb 80' X 2 with rollator walker with cues on upright posture / position of rollator walker and orienting patellas forward to minimize hip external rotation.  Pt amb 40' around square working on turning and maintaining rollator walker control.      11/20/2022: Therapeutic Exercise: Leg press BLEs 100# 15 reps; marching LEs 1x each during ext 75# 15 reps;  single LE 43# ea LE 10 reps 1 set, then 37# ea LE 10 reps 1 set; Pt & wife verbalized understanding as part of his gym workout.   Prosthetic Training with Transtibial Prostheses: LLE wound is now 2mm X 3mm with no signs of infection.  He can continue no using Tegaderm as long as wound continues to decrease in size.   RLE wound over patella 4mm X 5mm with no signs of infection covered with Tegaderm.  PT recommended continuing Tegaderm until it heals, then using baby oil to reduce friction of liner with knee flexion. Pt & wife verbalized understanding.  PT instructed in walking program to increase stamina and endurance - see HEP below.  He needs supervision for now. Pt & wife verbalized understanding.  PT demo & verbal cues how to follow with w/c while he is ambulating and able to safely supervise.      HOME EXERCISE PROGRAM: Increasing your activity level is important.  Short distances which is walking from one room to another. Work to increase frequency back to prior level. Start with walk each hour during awake hours, then every 45-50 min, decreasing until he is moving some every 30 min during day   Medium distances are entering & exiting your home or community with limited distances. Start with 4 medium walks which  is one outing to one location and increase number of tolerated amounts per day.  Long distance is your highest tolerance for you. Walk until you feel you must rest. Back or leg pain or general fatigue are indicators to maximum tolerance. Monitor by distance or time. Try to  walk your BEST distance 1-2 times per day. You should see this increase over time.      ASSESSMENT:  CLINICAL IMPRESSION: Patient appears to have improved ability to don prosthesis without assistance.  Patient is progressing toward short-term goals but did not achieve them yet.  Patient continues to benefit from skilled PT  OBJECTIVE IMPAIRMENTS: Abnormal gait, decreased activity tolerance, decreased balance, decreased endurance, decreased knowledge of use of DME, decreased mobility, difficulty walking, decreased ROM, decreased strength, increased edema, postural dysfunction, and prosthetic dependency .   ACTIVITY LIMITATIONS: carrying, standing, stairs, transfers, locomotion level, and standing ADLs  PARTICIPATION LIMITATIONS: community activity and household mobility  PERSONAL FACTORS: Age, Fitness, Past/current experiences, Time since onset of injury/illness/exacerbation, and 3+ comorbidities: see PMH  are also affecting patient's functional outcome.   REHAB POTENTIAL: Good  CLINICAL DECISION MAKING: Evolving/moderate complexity  EVALUATION COMPLEXITY: Moderate   GOALS: Goals reviewed with patient? Yes  SHORT TERM GOALS: Target date: 12/05/2022  Patient verbalizes proper skin care & checking limbs.  Baseline: SEE OBJECTIVE DATA Goal status: Ongoing 12/04/2022 2.  Patient tolerates prostheses >12 hrs total /day without skin issues or limb pain Baseline: SEE OBJECTIVE DATA Goal status: Ongoing 12/04/2022  3.  Patient able to reach 10" and look over both shoulders with single UE support on RW safely. Baseline: SEE OBJECTIVE DATA Goal status: Ongoing 12/04/2022  4. Patient ambulates 200' with RW & prostheses with supervision. Baseline: SEE OBJECTIVE DATA Goal status: Ongoing 12/04/2022  5. Patient negotiates ramps & curbs with RW & prostheses with minA. Baseline: SEE OBJECTIVE DATA Goal status: Ongoing 12/04/2022  LONG TERM GOALS: Target date: 01/30/2023  Patient  demonstrates & verbalized understanding of prosthetic care to enable safe utilization of prosthesis. Baseline: SEE OBJECTIVE DATA Goal status:   Ongoing 11/18/2022  Patient tolerates prostheses wear >90% of awake hours without skin or limb pain issues. Baseline: SEE OBJECTIVE DATA Goal status: Ongoing 11/18/2022  Tasks of [employer] with RW support >/= 30/56 Baseline: SEE OBJECTIVE DATA Goal status: Ongoing 11/18/2022  Patient ambulates >300' with prosthesis & LRAD independently Baseline: SEE OBJECTIVE DATA Goal status: Ongoing 11/18/2022  Patient negotiates ramps, curbs & stairs with single rail with prostheses & LRAD modified independent. Baseline: SEE OBJECTIVE DATA Goal status: Ongoing 11/18/2022  Patient verbalizes & demonstrates understanding of how to properly use fitness equipment to return to gym. Baseline: SEE OBJECTIVE DATA Goal status:  Ongoing 11/18/2022  PLAN:  PT FREQUENCY: 2x/week  PT DURATION: 12 weeks  PLANNED INTERVENTIONS: Therapeutic exercises, Therapeutic activity, Neuromuscular re-education, Balance training, Gait training, Patient/Family education, Self Care, Stair training, Prosthetic training, DME instructions, Aquatic Therapy, Re-evaluation, and physical performance testing  PLAN FOR NEXT SESSION: Work towards STGs,  continue prosthetic gait with rollator walker. Balance activities and functional exercises to promote an upright trunk. Instruct in hamstring & gastroc stretches.    Vladimir Faster, PT, DPT 12/04/2022, 5:26 PM

## 2022-12-08 ENCOUNTER — Other Ambulatory Visit: Payer: Self-pay | Admitting: Cardiology

## 2022-12-08 DIAGNOSIS — G250 Essential tremor: Secondary | ICD-10-CM

## 2022-12-08 DIAGNOSIS — I10 Essential (primary) hypertension: Secondary | ICD-10-CM

## 2022-12-11 ENCOUNTER — Encounter: Payer: Self-pay | Admitting: Physical Therapy

## 2022-12-11 ENCOUNTER — Ambulatory Visit (INDEPENDENT_AMBULATORY_CARE_PROVIDER_SITE_OTHER): Payer: Medicare Other | Admitting: Physical Therapy

## 2022-12-11 DIAGNOSIS — Z7409 Other reduced mobility: Secondary | ICD-10-CM

## 2022-12-11 DIAGNOSIS — R2689 Other abnormalities of gait and mobility: Secondary | ICD-10-CM

## 2022-12-11 DIAGNOSIS — R293 Abnormal posture: Secondary | ICD-10-CM

## 2022-12-11 DIAGNOSIS — Z9181 History of falling: Secondary | ICD-10-CM

## 2022-12-11 DIAGNOSIS — R2681 Unsteadiness on feet: Secondary | ICD-10-CM | POA: Diagnosis not present

## 2022-12-11 DIAGNOSIS — L98498 Non-pressure chronic ulcer of skin of other sites with other specified severity: Secondary | ICD-10-CM

## 2022-12-11 DIAGNOSIS — M6281 Muscle weakness (generalized): Secondary | ICD-10-CM | POA: Diagnosis not present

## 2022-12-11 NOTE — Therapy (Signed)
OUTPATIENT PHYSICAL THERAPY PROSTHETIC TREATMENT   Patient Name: [name] MRN: [id number] DOB:[birth year], 72 y.o., male Today's Date: 12/11/2022  PCP: Adolph Pollack, FNP REFERRING PROVIDER: Aldean Baker, MD  END OF SESSION:  PT End of Session - 12/11/22 1337     Visit Number 7    Number of Visits 25    Date for PT Re-Evaluation 01/30/23    Authorization Type Medicare & BCBS supplement    Progress Note Due on Visit 10    PT Start Time 1337    PT Stop Time 1425    PT Time Calculation (min) 48 min    Equipment Utilized During Treatment Gait belt    Activity Tolerance Patient tolerated treatment well    Behavior During Therapy WFL for tasks assessed/performed                   Past Medical History:  Diagnosis Date   Acute osteomyelitis of metatarsal bone of left foot (HCC)    Ambulates with cane    straight cane   Anemia    Cervical myelopathy (HCC) 02/06/2018   Chronic kidney disease    dailysis M W F- home   Community acquired pneumonia of left lower lobe of lung 08/18/2021   Complication of anesthesia    Coronary artery disease    Dehiscence of amputation stump of left lower extremity (HCC)    Diabetes mellitus without complication (HCC)    type2   Diabetic foot ulcer (HCC)    Diabetic neuropathy (HCC) 02/06/2018   Dysrhythmia    Gait abnormality 08/24/2018   GERD (gastroesophageal reflux disease)     01/06/20- not current   Gout    History of blood transfusion    History of blood transfusion    History of kidney stones    passed stones   Hypercholesteremia    Hypertension    Hypothyroidism    Neuromuscular disorder (HCC)    neuropathy left leg and bilateral feet   Neuropathy    Partial nontraumatic amputation of foot, 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   PONV (postoperative nausea and vomiting)    Prostate cancer (HCC)    PVD (peripheral vascular disease) (HCC)    with amputations   Past Surgical History:  Procedure Laterality Date   A-FLUTTER  ABLATION N/A 04/06/2020   Procedure: A-FLUTTER ABLATION;  Surgeon: Marinus Maw, MD;  Location: [facility];  Service: Cardiovascular;  Laterality: N/A;   AMPUTATION Left 12/25/2013   Procedure: AMPUTATION RAY LEFT 5TH RAY;  Surgeon: Nadara Mustard, MD;  Location: [facility] ORS;  Service: Orthopedics;  Laterality: Left;   AMPUTATION Right 12/15/2018   Procedure: AMPUTATION OF 4TH AND 5TH TOES RIGHT FOOT;  Surgeon: Nadara Mustard, MD;  Location: [facility] OR;  Service: Orthopedics;  Laterality: Right;   AMPUTATION Left 02/02/2021   Procedure: LEFT FOOT 4TH RAY AMPUTATION;  Surgeon: Nadara Mustard, MD;  Location: [facility] OR;  Service: Orthopedics;  Laterality: Left;   AMPUTATION Left 05/09/2021   Procedure: LEFT BELOW KNEE AMPUTATION;  Surgeon: Nadara Mustard, MD;  Location: [facility] OR;  Service: Orthopedics;  Laterality: Left;   AMPUTATION Right 07/26/2022   Procedure: RIGHT BELOW KNEE AMPUTATION;  Surgeon: Nadara Mustard, MD;  Location: [facility] OR;  Service: Orthopedics;  Laterality: Right;   APPLICATION OF WOUND VAC Right 12/15/2018   Procedure: APPLICATION OF WOUND VAC;  Surgeon: Nadara Mustard, MD;  Location: [facility] OR;  Service: Orthopedics;  Laterality: Right;  AV FISTULA PLACEMENT Left 03/25/2019   Procedure: LEFT ARM ARTERIOVENOUS (AV) FISTULA CREATION;  Surgeon: Nada Libman, MD;  Location: [facility] OR;  Service: Vascular;  Laterality: Left;   BACK SURGERY     BASCILIC VEIN TRANSPOSITION Left 05/20/2019   Procedure: SECOND STAGE LEFT BASCILIC VEIN TRANSPOSITION;  Surgeon: Nada Libman, MD;  Location: [facility] OR;  Service: Vascular;  Laterality: Left;   BIOPSY  06/22/2022   Procedure: BIOPSY;  Surgeon: Lemar Lofty., MD;  Location: [facility] ENDOSCOPY;  Service: Gastroenterology;;   CARDIAC CATHETERIZATION  02/17/2014   CHOLECYSTECTOMY     COLONOSCOPY  2011   in [place], Normal   COLONOSCOPY N/A 06/23/2022   Procedure: COLONOSCOPY;  Surgeon: Sherrilyn Rist, MD;  Location: [facility] ENDOSCOPY;  Service:  Gastroenterology;  Laterality: N/A;   CORONARY ARTERY BYPASS GRAFT  2008   CYSTOSCOPY N/A 06/29/2020   Procedure: CYSTOSCOPY WITH CLOT EVACUATION AND  FULGERATION;  Surgeon: Marcine Matar, MD;  Location: [facility] OR;  Service: Urology;  Laterality: N/A;   CYSTOSCOPY WITH FULGERATION Bilateral 06/17/2020   Procedure: CYSTOSCOPY,BILATERAL RETROGRADE, CLOT EVACUATION WITH FULGERATION OF THE BLADDER;  Surgeon: Jannifer Hick, MD;  Location: [facility] OR;  Service: Urology;  Laterality: Bilateral;   CYSTOSCOPY WITH FULGERATION N/A 06/28/2020   Procedure: CYSTOSCOPY WITH CLOT EVACUATION AND FULGERATION OF BLEEDERS;  Surgeon: Marcine Matar, MD;  Location: [facility] OR;  Service: Urology;  Laterality: N/A;  1 HR   ENTEROSCOPY N/A 06/22/2022   Procedure: ENTEROSCOPY;  Surgeon: Meridee Score Netty Starring., MD;  Location: [facility] ENDOSCOPY;  Service: Gastroenterology;  Laterality: N/A;   HEMOSTASIS CLIP PLACEMENT  06/22/2022   Procedure: HEMOSTASIS CLIP PLACEMENT;  Surgeon: Lemar Lofty., MD;  Location: [facility] ENDOSCOPY;  Service: Gastroenterology;;   HOT HEMOSTASIS N/A 06/22/2022   Procedure: HOT HEMOSTASIS (ARGON PLASMA COAGULATION/BICAP);  Surgeon: Lemar Lofty., MD;  Location: [facility] ENDOSCOPY;  Service: Gastroenterology;  Laterality: N/A;   I & D EXTREMITY Right 12/15/2018   Procedure: DEBRIDEMENT RIGHT FOOT;  Surgeon: Nadara Mustard, MD;  Location: [facility] OR;  Service: Orthopedics;  Laterality: Right;   I & D EXTREMITY Right 08/27/2019   Procedure: PARTIAL CUBOID EXCISION RIGHT FOOT;  Surgeon: Nadara Mustard, MD;  Location: [facility] OR;  Service: Orthopedics;  Laterality: Right;   I & D EXTREMITY Right 01/07/2020   Procedure: RIGHT FOOT EXCISION INFECTED BONE;  Surgeon: Nadara Mustard, MD;  Location: [facility] OR;  Service: Orthopedics;  Laterality: Right;   I & D EXTREMITY Right 05/17/2022   Procedure: IRRIGATION AND DEBRIDEMENT RIGHT FOOT ABSCESS;  Surgeon: Nadara Mustard, MD;  Location: [facility] OR;  Service: Orthopedics;  Laterality:  Right;   IR PARACENTESIS  10/11/2022   NECK SURGERY     novemver 2019   POLYPECTOMY  06/22/2022   Procedure: POLYPECTOMY;  Surgeon: Meridee Score Netty Starring., MD;  Location: [facility] ENDOSCOPY;  Service: Gastroenterology;;   POLYPECTOMY  06/23/2022   Procedure: POLYPECTOMY;  Surgeon: Sherrilyn Rist, MD;  Location: [facility] ENDOSCOPY;  Service: Gastroenterology;;   STUMP REVISION Left 06/27/2021   Procedure: REVISION LEFT BELOW KNEE AMPUTATION;  Surgeon: Nadara Mustard, MD;  Location: [facility] OR;  Service: Orthopedics;  Laterality: Left;   SUBMUCOSAL LIFTING INJECTION  06/22/2022   Procedure: SUBMUCOSAL LIFTING INJECTION;  Surgeon: Meridee Score Netty Starring., MD;  Location: [facility] ENDOSCOPY;  Service: Gastroenterology;;   SUBMUCOSAL TATTOO INJECTION  06/22/2022   Procedure: SUBMUCOSAL TATTOO INJECTION;  Surgeon: Lemar Lofty., MD;  Location: [facility] ENDOSCOPY;  Service: Gastroenterology;;  TRANSURETHRAL RESECTION OF PROSTATE N/A 06/28/2020   Procedure: TRANSURETHRAL RESECTION OF THE PROSTATE (TURP);  Surgeon: Marcine Matar, MD;  Location: [facility] OR;  Service: Urology;  Laterality: N/A;   WISDOM TOOTH EXTRACTION     Patient Active Problem List   Diagnosis Date Noted   Right below-knee amputee (HCC) 07/29/2022   Severe sepsis (HCC) 07/23/2022   Foot infection 07/22/2022   Acute respiratory failure (HCC) 07/22/2022   Anemia due to chronic blood loss 06/23/2022   Heme positive stool 06/23/2022   Benign neoplasm of cecum 06/23/2022   Benign neoplasm of transverse colon 06/23/2022   Benign neoplasm of descending colon 06/23/2022   Abscess of right foot 05/17/2022   Contraindication to anticoagulation therapy GU Bleed 03/13/2022   Paroxysmal atrial fibrillation (HCC) 08/18/2021   Anemia of chronic renal failure 08/18/2021   Thrombocytopenia (HCC) 08/18/2021   Acute metabolic encephalopathy 08/18/2021   Action induced myoclonus 08/18/2021   Left below-knee amputee (HCC) 05/11/2021   Symptomatic anemia  09/13/2020   COVID-19 virus infection 09/13/2020   Pressure injury of skin 06/29/2020   Gross hematuria 06/16/2020   Typical atrial flutter (HCC) 03/24/2020   Bilateral pleural effusion 02/24/2020   Healthcare maintenance 01/28/2020   Shortness of breath 01/28/2020   History of partial ray amputation of fourth toe of right foot (HCC) 09/08/2019   Chronic cough 06/25/2019   Cutaneous abscess of right foot    Subacute osteomyelitis, right ankle and foot (HCC) 12/14/2018   AKI (acute kidney injury) (HCC) 12/14/2018   ESRD on dialysis (HCC) 12/14/2018   S/P CABG (coronary artery bypass graft) 12/11/2018   Gait abnormality 08/24/2018   Diabetic neuropathy (HCC) 02/06/2018   Cervical myelopathy (HCC) 02/06/2018   Onychomycosis 10/30/2017   Other spondylosis with radiculopathy, cervical region 01/27/2017   Midfoot ulcer, right, limited to breakdown of skin (HCC) 11/15/2016   Lateral epicondylitis, left elbow 08/12/2016   Prostate cancer (HCC) 06/19/2016   Cellulitis of right foot 12/24/2015   Gout 09/14/2012   Hypertension associated with diabetes (HCC) 09/14/2012   Hypothyroidism 09/14/2012   CAD (coronary artery disease) 09/14/2012   Insulin dependent type 2 diabetes mellitus (HCC) 09/14/2012   Hyperlipidemia associated with type 2 diabetes mellitus (HCC)    Neuropathy (HCC)     ONSET DATE: 10/31/2022 right BKA prosthesis delivery  REFERRING DIAG:  U13.244W (ICD-10-CM) - Below-knee amputation of left lower extremity, initial encounter (HCC)  N02.725D (ICD-10-CM) - Below-knee amputation of right lower extremity, initial encounter (HCC)   THERAPY DIAG:  Unsteadiness on feet  Other abnormalities of gait and mobility  Muscle weakness (generalized)  Abnormal posture  Non-pressure chronic ulcer of skin of other sites with other specified severity (HCC)  History of falling  Impaired functional mobility, balance, gait, and endurance  Rationale for Evaluation and Treatment:  Rehabilitation  SUBJECTIVE:   SUBJECTIVE STATEMENT:   He went to gym today with upper body & leg press workout.  He walked with rollator but has to take breaks every 100' or so. He has to stop mainly from shoulder pain.  He has been removing Tegaderm at night some.  The CNA thinks wounds are not healing.     PERTINENT HISTORY: ESRD on home dialysis, HTN, HLD, CAD, DM2, PVD, prostate CA, cervical myelopathy,    PAIN:  Are you having pain? No  PRECAUTIONS: Fall and Other: No BP LUE  WEIGHT BEARING RESTRICTIONS: No  FALLS: Has patient fallen in last 6 months? Yes. Number of falls 1 his left prosthesis  slipped off & denies injuries.   LIVING ENVIRONMENT: Lives with: lives with their spouse and CNA Mon-Fri 1:00-5:00 Lives in: House  Home Access: Ramped entrance Home layout:  3 level enters on middle floor which has bedroom & full bath. Stairs: downstairs has chair lift.  Has following equipment at home: Single point cane, Walker - 2 wheeled, Environmental consultant - 4 wheeled, Wheelchair (manual), [employer], Grab bars, and Ramped entry  OCCUPATION: retired  PLOF: he was walking with RW with left prosthesis.    PATIENT GOALS:   to use 2 prostheses to walk with cane. He wants to bring garbage cans up sloped driveway.    OBJECTIVE:  COGNITION: Eval on 11/06/2022:  Overall cognitive status: Within functional limits for tasks assessed  POSTURE: Eval on 11/06/2022: rounded shoulders, forward head, flexed trunk , and weight shift left  LOWER EXTREMITY ROM:  ROM P:passive  A:active Right eval  Hip flexion   Hip extension   Hip abduction   Hip adduction   Hip internal rotation   Hip external rotation   Knee flexion   Knee extension seated P: -6*  Ankle dorsiflexion   Ankle plantarflexion   Ankle inversion   Ankle eversion    (Blank rows = not tested)  LOWER EXTREMITY MMT:  MMT Right eval Left eval  Hip flexion 4/5 4/5  Hip extension 3/5 3+5  Hip abduction 35 3+5  Hip adduction     Hip internal rotation    Hip external rotation    Knee flexion 3+/5 4/5  Knee extension 3+/5 4/5  Eval MMT gross seated & functional  (Blank rows = not tested)  TRANSFERS: Eval on 11/06/2022: Sit to stand: SBA 22" w/c using BUEs on armrests requires RW for stabilization Stand to sit: SBA RW for stabilization to 22" w/c using BUEs on armrests  GAIT: Eval on 11/06/2022: Gait pattern: step to pattern, decreased step length- Left, decreased stance time- Right, Right hip hike, Left hip hike, knee flexed in stance- Right, lateral hip instability, trunk flexed, and wide BOS Distance walked: 25' turning 180* CW & CCW Assistive device utilized: Environmental consultant - 2 wheeled and bilateral TTA prostheses Level of assistance: Min A  FUNCTIONAL TESTs:  Eval on 11/06/2022: Standing balance with RW support with close supervision: reaches 5" anteriorly, looks to side only when looking over shoulders and reaches towards floor within 10".    CURRENT PROSTHETIC WEAR ASSESSMENT: 11/06/2022:  Patient is independent with: prosthetic cleaning and ply sock cleaning Patient is dependent with: skin check, residual limb care, proper wear schedule/adjustment, and proper weight-bearing schedule/adjustment Donning prosthesis: SBA / verbal cues Doffing prosthesis: Modified independence Prosthetic wear tolerance: pt reports wearing left prosthesis daily all awake hours for several months.  Right prosthesis has worn daily for 6 days since receiving it for most of awake hours.  Prosthetic weight bearing tolerance: 5 minutes with no c/o pain Edema: right limb has pitting edema with 5 sec capillary refill.  Left limb has no edema noted. Residual limb condition: Left limb has superficial wound 5mm with white perimeter from excessive fluid & CNA reports some drainage;  Right limb has no open areas, distal area has redness & shiny skin, normal temperature, no hair growth.  Prosthetic description: silicon liners (9mm over tibia & 6mm  posterior portion) with pin lock suspension, socket with ratchet suspension, flexible Keel feet     TODAY'S TREATMENT:  DATE:  12/11/2022: Prosthetic Training with Transtibial Prostheses: Right residual limb with wound 7mm over patella and 2nd wound posterior gastroc belly area 3mm. Other wounds have healed. No signs of infection.  Pt amb 80', 20' & 46' with rollator walker with cues for upright posture looking forward / not staring at floor and staying close to rollator walker.  Therapeutic Exercise: Attempted recumbent bike as gym has one, but unable to adjust seat so feet would stay on pedal or allow knee flexion.  Nustep seat 10 level 5 with BLEs & BUEs 8 min with cues to hold head up / upper body posture.  Standing with single UE support on locked stabilized rollator walker: green theraband 5 reps 2 sets ea exercise BUEs for row, forward reach & bicep curls.  Sitting on 24" bar stool with feet on floor: upright trunk without UE assist for 2 min with verbal & tactile cues to correct posture.     12/04/2022: Prosthetic Training with Transtibial Prostheses: PT reviewed recommendation to remove Tegaderm in evenings when done with the prostheses for the day and to wash / rinse them with soap and water.  He needs to have Tegaderm reapplied in the morning prior to donning prostheses.  Patient and CNA verbalized understanding. PT reviewed donning prostheses including pen alignment with donning liner, sliding forward at an angle to reduced chair resistance with posterior thigh and angling the prosthesis so that the pin and hole are at the same angle.  Patient and CNA verbalized better understanding.  Patient able to don prosthesis with less resistance. Patient ambulated 80 feet x 2 with rollator walker with cues on upright posture / position of rollator walker and orienting  patellas forward to minimize hip external rotation.  Standing with posterior pelvis to sink flexing forward to place hands on block rollator seat back to upright with focus on posture and back extension.  Patient performed 2 reps leading down and up with left upper extremity and 2 reps with right upper extremity with CGA. Patient able to turn 180 degrees to sit on rollator seat with PT stabilizing rollator walker and minA.    12/03/2022: Prosthetic Training with Transtibial Prostheses: Patient has 4 wounds on right limb: one over patella, one at distal tibia and two posterior over gastroc belly.  No signs of infection. He has been wearing Tegaderm over 3 wounds for 2 days since his wife left for [place] trip.  CNA present with pt.  Tegaderm appears to keeping wounds too moist.  PT recommended removing Tegaderm at night and reapplying in morning.  He needs to use a portion of bandaid at corner of Tegaderm so pt can remove himself at night. PT demo this. Pt & CNA verbalized understanding.  Pt amb 80' X 2 with rollator walker with cues on upright posture / position of rollator walker and orienting patellas forward to minimize hip external rotation.  Pt amb 40' around square working on turning and maintaining rollator walker control.      HOME EXERCISE PROGRAM: Increasing your activity level is important.  Short distances which is walking from one room to another. Work to increase frequency back to prior level. Start with walk each hour during awake hours, then every 45-50 min, decreasing until he is moving some every 30 min during day   Medium distances are entering & exiting your home or community with limited distances. Start with 4 medium walks which is one outing to one location and increase number of tolerated amounts per  day.  Long distance is your highest tolerance for you. Walk until you feel you must rest. Back or leg pain or general fatigue are indicators to maximum tolerance. Monitor by  distance or time. Try to walk your BEST distance 1-2 times per day. You should see this increase over time.      ASSESSMENT:  CLINICAL IMPRESSION: Patient has healed 3 of 5 wounds but needs PT guidance for prosthesis use without increase wound size or issues.  PT focusing on strength and endurance for upright posture. This should decrease weight bearing on hands / arms with gait which is primary limiting factor to distance currently. Patient continues to benefit from skilled PT  OBJECTIVE IMPAIRMENTS: Abnormal gait, decreased activity tolerance, decreased balance, decreased endurance, decreased knowledge of use of DME, decreased mobility, difficulty walking, decreased ROM, decreased strength, increased edema, postural dysfunction, and prosthetic dependency .   ACTIVITY LIMITATIONS: carrying, standing, stairs, transfers, locomotion level, and standing ADLs  PARTICIPATION LIMITATIONS: community activity and household mobility  PERSONAL FACTORS: Age, Fitness, Past/current experiences, Time since onset of injury/illness/exacerbation, and 3+ comorbidities: see PMH  are also affecting patient's functional outcome.   REHAB POTENTIAL: Good  CLINICAL DECISION MAKING: Evolving/moderate complexity  EVALUATION COMPLEXITY: Moderate   GOALS: Goals reviewed with patient? Yes  SHORT TERM GOALS: Target date: 01/02/2023  Patient verbalizes proper skin care & checking limbs.  Baseline: SEE OBJECTIVE DATA Goal status: Ongoing 12/11/2022 2.  Patient tolerates prostheses >12 hrs total /day without skin issues or limb pain Baseline: SEE OBJECTIVE DATA Goal status: Ongoing 12/11/2022  3.  Patient able to reach 10" and look over both shoulders with single UE support on RW safely. Baseline: SEE OBJECTIVE DATA Goal status: Ongoing 12/11/2022  4. Patient ambulates 200' with RW & prostheses with supervision. Baseline: SEE OBJECTIVE DATA Goal status: Ongoing 12/11/2022  5. Patient negotiates ramps & curbs with  RW & prostheses with minA. Baseline: SEE OBJECTIVE DATA Goal status: Ongoing 12/11/2022  LONG TERM GOALS: Target date: 01/30/2023  Patient demonstrates & verbalized understanding of prosthetic care to enable safe utilization of prosthesis. Baseline: SEE OBJECTIVE DATA Goal status:   Ongoing 12/11/2022  Patient tolerates prostheses wear >90% of awake hours without skin or limb pain issues. Baseline: SEE OBJECTIVE DATA Goal status: Ongoing 12/11/2022  Tasks of [employer] with RW support >/= 30/56 Baseline: SEE OBJECTIVE DATA Goal status: Ongoing 12/11/2022  Patient ambulates >300' with prosthesis & LRAD independently Baseline: SEE OBJECTIVE DATA Goal status: Ongoing 12/11/2022  Patient negotiates ramps, curbs & stairs with single rail with prostheses & LRAD modified independent. Baseline: SEE OBJECTIVE DATA Goal status: Ongoing 12/11/2022  Patient verbalizes & demonstrates understanding of how to properly use fitness equipment to return to gym. Baseline: SEE OBJECTIVE DATA Goal status:  Ongoing 12/11/2022  PLAN:  PT FREQUENCY: 2x/week  PT DURATION: 12 weeks  PLANNED INTERVENTIONS: Therapeutic exercises, Therapeutic activity, Neuromuscular re-education, Balance training, Gait training, Patient/Family education, Self Care, Stair training, Prosthetic training, DME instructions, Aquatic Therapy, Re-evaluation, and physical performance testing  PLAN FOR NEXT SESSION:   continue to Work towards STGs,  continue prosthetic gait with rollator walker. Balance activities and functional exercises to promote an upright trunk. Instruct in hamstring & gastroc stretches.    Vladimir Faster, PT, DPT 12/11/2022, 3:25 PM

## 2022-12-16 ENCOUNTER — Ambulatory Visit (INDEPENDENT_AMBULATORY_CARE_PROVIDER_SITE_OTHER): Payer: Medicare Other | Admitting: Physical Therapy

## 2022-12-16 ENCOUNTER — Encounter: Payer: Self-pay | Admitting: Physical Therapy

## 2022-12-16 DIAGNOSIS — M6281 Muscle weakness (generalized): Secondary | ICD-10-CM

## 2022-12-16 DIAGNOSIS — R293 Abnormal posture: Secondary | ICD-10-CM | POA: Diagnosis not present

## 2022-12-16 DIAGNOSIS — Z9181 History of falling: Secondary | ICD-10-CM

## 2022-12-16 DIAGNOSIS — R2681 Unsteadiness on feet: Secondary | ICD-10-CM

## 2022-12-16 DIAGNOSIS — L98498 Non-pressure chronic ulcer of skin of other sites with other specified severity: Secondary | ICD-10-CM

## 2022-12-16 DIAGNOSIS — Z7409 Other reduced mobility: Secondary | ICD-10-CM

## 2022-12-16 DIAGNOSIS — R2689 Other abnormalities of gait and mobility: Secondary | ICD-10-CM

## 2022-12-16 NOTE — Therapy (Signed)
OUTPATIENT PHYSICAL THERAPY PROSTHETIC TREATMENT   Patient Name: [name] MRN: [id number] DOB:08-15-1950, 72 y.o., male Today's Date: 12/16/2022  PCP: Adolph Pollack, FNP REFERRING PROVIDER: Aldean Baker, MD  END OF SESSION:  PT End of Session - 12/16/22 1347     Visit Number 8    Number of Visits 25    Date for PT Re-Evaluation 01/30/23    Authorization Type Medicare & BCBS supplement    Progress Note Due on Visit 10    PT Start Time 1345    PT Stop Time 1431    PT Time Calculation (min) 46 min    Equipment Utilized During Treatment Gait belt    Activity Tolerance Patient tolerated treatment well    Behavior During Therapy WFL for tasks assessed/performed                    Past Medical History:  Diagnosis Date   Acute osteomyelitis of metatarsal bone of left foot (HCC)    Ambulates with cane    straight cane   Anemia    Cervical myelopathy (HCC) 02/06/2018   Chronic kidney disease    dailysis M W F- home   Community acquired pneumonia of left lower lobe of lung 08/18/2021   Complication of anesthesia    Coronary artery disease    Dehiscence of amputation stump of left lower extremity (HCC)    Diabetes mellitus without complication (HCC)    type2   Diabetic foot ulcer (HCC)    Diabetic neuropathy (HCC) 02/06/2018   Dysrhythmia    Gait abnormality 08/24/2018   GERD (gastroesophageal reflux disease)     01/06/20- not current   Gout    History of blood transfusion    History of blood transfusion    History of kidney stones    passed stones   Hypercholesteremia    Hypertension    Hypothyroidism    Neuromuscular disorder (HCC)    neuropathy left leg and bilateral feet   Neuropathy    Partial nontraumatic amputation of foot, 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   PONV (postoperative nausea and vomiting)    Prostate cancer (HCC)    PVD (peripheral vascular disease) (HCC)    with amputations   Past Surgical History:  Procedure Laterality Date   A-FLUTTER  ABLATION N/A 04/06/2020   Procedure: A-FLUTTER ABLATION;  Surgeon: Marinus Maw, MD;  Location: [facility] INV[facility];  Service: Cardiovascular;  Laterality: N/A;   AMPUTATION Left 12/25/2013   Procedure: AMPUTATION RAY LEFT 5TH RAY;  Surgeon: Nadara Mustard, MD;  Location: [facility] ORS;  Service: Orthopedics;  Laterality: Left;   AMPUTATION Right 12/15/2018   Procedure: AMPUTATION OF 4TH AND 5TH TOES RIGHT FOOT;  Surgeon: Nadara Mustard, MD;  Location: [facility] OR;  Service: Orthopedics;  Laterality: Right;   AMPUTATION Left 02/02/2021   Procedure: LEFT FOOT 4TH RAY AMPUTATION;  Surgeon: Nadara Mustard, MD;  Location: [facility] OR;  Service: Orthopedics;  Laterality: Left;   AMPUTATION Left 05/09/2021   Procedure: LEFT BELOW KNEE AMPUTATION;  Surgeon: Nadara Mustard, MD;  Location: [facility] OR;  Service: Orthopedics;  Laterality: Left;   AMPUTATION Right 07/26/2022   Procedure: RIGHT BELOW KNEE AMPUTATION;  Surgeon: Nadara Mustard, MD;  Location: [facility] OR;  Service: Orthopedics;  Laterality: Right;   APPLICATION OF WOUND VAC Right 12/15/2018   Procedure: APPLICATION OF WOUND VAC;  Surgeon: Nadara Mustard, MD;  Location: [facility] OR;  Service: Orthopedics;  Laterality: Right;  AV FISTULA PLACEMENT Left 03/25/2019   Procedure: LEFT ARM ARTERIOVENOUS (AV) FISTULA CREATION;  Surgeon: Nada Libman, MD;  Location: [facility] OR;  Service: Vascular;  Laterality: Left;   BACK SURGERY     BASCILIC VEIN TRANSPOSITION Left 05/20/2019   Procedure: SECOND STAGE LEFT BASCILIC VEIN TRANSPOSITION;  Surgeon: Nada Libman, MD;  Location: [facility] OR;  Service: Vascular;  Laterality: Left;   BIOPSY  06/22/2022   Procedure: BIOPSY;  Surgeon: Lemar Lofty., MD;  Location: [facility] ENDOSCOPY;  Service: Gastroenterology;;   CARDIAC CATHETERIZATION  02/17/2014   CHOLECYSTECTOMY     COLONOSCOPY  2011   in [place], Normal   COLONOSCOPY N/A 06/23/2022   Procedure: COLONOSCOPY;  Surgeon: Sherrilyn Rist, MD;  Location: [facility] ENDOSCOPY;  Service:  Gastroenterology;  Laterality: N/A;   CORONARY ARTERY BYPASS GRAFT  2008   CYSTOSCOPY N/A 06/29/2020   Procedure: CYSTOSCOPY WITH CLOT EVACUATION AND  FULGERATION;  Surgeon: Marcine Matar, MD;  Location: [facility] OR;  Service: Urology;  Laterality: N/A;   CYSTOSCOPY WITH FULGERATION Bilateral 06/17/2020   Procedure: CYSTOSCOPY,BILATERAL RETROGRADE, CLOT EVACUATION WITH FULGERATION OF THE BLADDER;  Surgeon: Jannifer Hick, MD;  Location: [facility] OR;  Service: Urology;  Laterality: Bilateral;   CYSTOSCOPY WITH FULGERATION N/A 06/28/2020   Procedure: CYSTOSCOPY WITH CLOT EVACUATION AND FULGERATION OF BLEEDERS;  Surgeon: Marcine Matar, MD;  Location: [facility] OR;  Service: Urology;  Laterality: N/A;  1 HR   ENTEROSCOPY N/A 06/22/2022   Procedure: ENTEROSCOPY;  Surgeon: Meridee Score Netty Starring., MD;  Location: [facility] ENDOSCOPY;  Service: Gastroenterology;  Laterality: N/A;   HEMOSTASIS CLIP PLACEMENT  06/22/2022   Procedure: HEMOSTASIS CLIP PLACEMENT;  Surgeon: Lemar Lofty., MD;  Location: [facility] ENDOSCOPY;  Service: Gastroenterology;;   HOT HEMOSTASIS N/A 06/22/2022   Procedure: HOT HEMOSTASIS (ARGON PLASMA COAGULATION/BICAP);  Surgeon: Lemar Lofty., MD;  Location: [facility] ENDOSCOPY;  Service: Gastroenterology;  Laterality: N/A;   I & D EXTREMITY Right 12/15/2018   Procedure: DEBRIDEMENT RIGHT FOOT;  Surgeon: Nadara Mustard, MD;  Location: [facility] OR;  Service: Orthopedics;  Laterality: Right;   I & D EXTREMITY Right 08/27/2019   Procedure: PARTIAL CUBOID EXCISION RIGHT FOOT;  Surgeon: Nadara Mustard, MD;  Location: [facility] OR;  Service: Orthopedics;  Laterality: Right;   I & D EXTREMITY Right 01/07/2020   Procedure: RIGHT FOOT EXCISION INFECTED BONE;  Surgeon: Nadara Mustard, MD;  Location: [facility] OR;  Service: Orthopedics;  Laterality: Right;   I & D EXTREMITY Right 05/17/2022   Procedure: IRRIGATION AND DEBRIDEMENT RIGHT FOOT ABSCESS;  Surgeon: Nadara Mustard, MD;  Location: [facility] OR;  Service: Orthopedics;  Laterality:  Right;   IR PARACENTESIS  10/11/2022   NECK SURGERY     novemver 2019   POLYPECTOMY  06/22/2022   Procedure: POLYPECTOMY;  Surgeon: Meridee Score Netty Starring., MD;  Location: [facility] ENDOSCOPY;  Service: Gastroenterology;;   POLYPECTOMY  06/23/2022   Procedure: POLYPECTOMY;  Surgeon: Sherrilyn Rist, MD;  Location: [facility] ENDOSCOPY;  Service: Gastroenterology;;   STUMP REVISION Left 06/27/2021   Procedure: REVISION LEFT BELOW KNEE AMPUTATION;  Surgeon: Nadara Mustard, MD;  Location: [facility] OR;  Service: Orthopedics;  Laterality: Left;   SUBMUCOSAL LIFTING INJECTION  06/22/2022   Procedure: SUBMUCOSAL LIFTING INJECTION;  Surgeon: Meridee Score Netty Starring., MD;  Location: [facility] ENDOSCOPY;  Service: Gastroenterology;;   SUBMUCOSAL TATTOO INJECTION  06/22/2022   Procedure: SUBMUCOSAL TATTOO INJECTION;  Surgeon: Lemar Lofty., MD;  Location: [facility] ENDOSCOPY;  Service: Gastroenterology;;  TRANSURETHRAL RESECTION OF PROSTATE N/A 06/28/2020   Procedure: TRANSURETHRAL RESECTION OF THE PROSTATE (TURP);  Surgeon: Marcine Matar, MD;  Location: [facility] OR;  Service: Urology;  Laterality: N/A;   WISDOM TOOTH EXTRACTION     Patient Active Problem List   Diagnosis Date Noted   Right below-knee amputee (HCC) 07/29/2022   Severe sepsis (HCC) 07/23/2022   Foot infection 07/22/2022   Acute respiratory failure (HCC) 07/22/2022   Anemia due to chronic blood loss 06/23/2022   Heme positive stool 06/23/2022   Benign neoplasm of cecum 06/23/2022   Benign neoplasm of transverse colon 06/23/2022   Benign neoplasm of descending colon 06/23/2022   Abscess of right foot 05/17/2022   Contraindication to anticoagulation therapy GU Bleed 03/13/2022   Paroxysmal atrial fibrillation (HCC) 08/18/2021   Anemia of chronic renal failure 08/18/2021   Thrombocytopenia (HCC) 08/18/2021   Acute metabolic encephalopathy 08/18/2021   Action induced myoclonus 08/18/2021   Left below-knee amputee (HCC) 05/11/2021   Symptomatic anemia  09/13/2020   COVID-19 virus infection 09/13/2020   Pressure injury of skin 06/29/2020   Gross hematuria 06/16/2020   Typical atrial flutter (HCC) 03/24/2020   Bilateral pleural effusion 02/24/2020   Healthcare maintenance 01/28/2020   Shortness of breath 01/28/2020   History of partial ray amputation of fourth toe of right foot (HCC) 09/08/2019   Chronic cough 06/25/2019   Cutaneous abscess of right foot    Subacute osteomyelitis, right ankle and foot (HCC) 12/14/2018   AKI (acute kidney injury) (HCC) 12/14/2018   ESRD on dialysis (HCC) 12/14/2018   S/P CABG (coronary artery bypass graft) 12/11/2018   Gait abnormality 08/24/2018   Diabetic neuropathy (HCC) 02/06/2018   Cervical myelopathy (HCC) 02/06/2018   Onychomycosis 10/30/2017   Other spondylosis with radiculopathy, cervical region 01/27/2017   Midfoot ulcer, right, limited to breakdown of skin (HCC) 11/15/2016   Lateral epicondylitis, left elbow 08/12/2016   Prostate cancer (HCC) 06/19/2016   Cellulitis of right foot 12/24/2015   Gout 09/14/2012   Hypertension associated with diabetes (HCC) 09/14/2012   Hypothyroidism 09/14/2012   CAD (coronary artery disease) 09/14/2012   Insulin dependent type 2 diabetes mellitus (HCC) 09/14/2012   Hyperlipidemia associated with type 2 diabetes mellitus (HCC)    Neuropathy (HCC)     ONSET DATE: 10/31/2022 right BKA prosthesis delivery  REFERRING DIAG:  Z61.096E (ICD-10-CM) - Below-knee amputation of left lower extremity, initial encounter (HCC)  A54.098J (ICD-10-CM) - Below-knee amputation of right lower extremity, initial encounter (HCC)   THERAPY DIAG:  Unsteadiness on feet  Other abnormalities of gait and mobility  Muscle weakness (generalized)  Abnormal posture  Non-pressure chronic ulcer of skin of other sites with other specified severity (HCC)  History of falling  Impaired functional mobility, balance, gait, and endurance  Rationale for Evaluation and Treatment:  Rehabilitation  SUBJECTIVE:   SUBJECTIVE STATEMENT: He got new wound on right leg on Friday or Saturday.  He is using Tegadem during day and removing at night. Denies falls.  PERTINENT HISTORY: ESRD on home dialysis, HTN, HLD, CAD, DM2, PVD, prostate CA, cervical myelopathy,    PAIN:  Are you having pain? No  PRECAUTIONS: Fall and Other: No BP LUE  WEIGHT BEARING RESTRICTIONS: No  FALLS: Has patient fallen in last 6 months? Yes. Number of falls 1 his left prosthesis slipped off & denies injuries.   LIVING ENVIRONMENT: Lives with: lives with their spouse and CNA Mon-Fri 1:00-5:00 Lives in: House  Home Access: Ramped entrance Home layout:  3 level  enters on middle floor which has bedroom & full bath. Stairs: downstairs has chair lift.  Has following equipment at home: Single point cane, Walker - 2 wheeled, Environmental consultant - 4 wheeled, Wheelchair (manual), [employer], Grab bars, and Ramped entry  OCCUPATION: retired  PLOF: he was walking with RW with left prosthesis.    PATIENT GOALS:   to use 2 prostheses to walk with cane. He wants to bring garbage cans up sloped driveway.    OBJECTIVE:  COGNITION: Eval on 11/06/2022:  Overall cognitive status: Within functional limits for tasks assessed  POSTURE: Eval on 11/06/2022: rounded shoulders, forward head, flexed trunk , and weight shift left  LOWER EXTREMITY ROM:  ROM P:passive  A:active Right eval  Hip flexion   Hip extension   Hip abduction   Hip adduction   Hip internal rotation   Hip external rotation   Knee flexion   Knee extension seated P: -6*  Ankle dorsiflexion   Ankle plantarflexion   Ankle inversion   Ankle eversion    (Blank rows = not tested)  LOWER EXTREMITY MMT:  MMT Right eval Left eval  Hip flexion 4/5 4/5  Hip extension 3/5 3+5  Hip abduction 35 3+5  Hip adduction    Hip internal rotation    Hip external rotation    Knee flexion 3+/5 4/5  Knee extension 3+/5 4/5  Eval MMT gross seated &  functional  (Blank rows = not tested)  TRANSFERS: Eval on 11/06/2022: Sit to stand: SBA 22" w/c using BUEs on armrests requires RW for stabilization Stand to sit: SBA RW for stabilization to 22" w/c using BUEs on armrests  GAIT: 12/16/2022: Pt amb 101' (max tolerable distance) with rollator walker with supervision / verbal cues on posture including position within walker.    Eval on 11/06/2022: Gait pattern: step to pattern, decreased step length- Left, decreased stance time- Right, Right hip hike, Left hip hike, knee flexed in stance- Right, lateral hip instability, trunk flexed, and wide BOS Distance walked: 25' turning 180* CW & CCW Assistive device utilized: Environmental consultant - 2 wheeled and bilateral TTA prostheses Level of assistance: Min A  FUNCTIONAL TESTs:  Eval on 11/06/2022: Standing balance with RW support with close supervision: reaches 5" anteriorly, looks to side only when looking over shoulders and reaches towards floor within 10".    CURRENT PROSTHETIC WEAR ASSESSMENT: 11/06/2022:  Patient is independent with: prosthetic cleaning and ply sock cleaning Patient is dependent with: skin check, residual limb care, proper wear schedule/adjustment, and proper weight-bearing schedule/adjustment Donning prosthesis: SBA / verbal cues Doffing prosthesis: Modified independence Prosthetic wear tolerance: pt reports wearing left prosthesis daily all awake hours for several months.  Right prosthesis has worn daily for 6 days since receiving it for most of awake hours.  Prosthetic weight bearing tolerance: 5 minutes with no c/o pain Edema: right limb has pitting edema with 5 sec capillary refill.  Left limb has no edema noted. Residual limb condition: Left limb has superficial wound 5mm with white perimeter from excessive fluid & CNA reports some drainage;  Right limb has no open areas, distal area has redness & shiny skin, normal temperature, no hair growth.  Prosthetic description: silicon liners  (9mm over tibia & 6mm posterior portion) with pin lock suspension, socket with ratchet suspension, flexible Keel feet     TODAY'S TREATMENT:  DATE:  12/16/2022: Prosthetic Training with Transtibial Prostheses: New wound superficial on medial femoral condyle 8mm diameter and 2nd new scratch near medial gastroc head (appears from scratching limb with fingernails). 2 previous wounds still present wound 7mm over patella and 2nd wound posterior gastroc belly area 3mm. No signs of infection.  Only wound over patella covered with Tegaderm. PT covered remaining wounds. PT educated on sitting posture with feet wide so he does not ER which prosthesis puts pressure on medial femoral condyle where he got new wound. Pt & CNA verbalized understanding.  Pt amb 101' (max tolerable distance) with rollator walker with supervision / verbal cues on posture including position within walker.    Therapeutic Exercise: Hamstring & gastroc stretches. See HEP below. PT educated with demo & verbal with HO. PT hand written directions for controlled trunk flexion to actively work back extensors placing hands on locked rollator seat then back to upright. 5 reps leading up & down with RUE & 5 reps with LUE.  Pt verbalized and return demo understanding of  3 exercises.  Step up & step down on 4" block with locked rollator walker 1 rep ea leading RLE & leading LLE with minA.    12/11/2022: Prosthetic Training with Transtibial Prostheses: Right residual limb with wound 7mm over patella and 2nd wound posterior gastroc belly area 3mm. Other wounds have healed. No signs of infection.  Pt amb 80', 20' & 22' with rollator walker with cues for upright posture looking forward / not staring at floor and staying close to rollator walker.  Therapeutic Exercise: Attempted recumbent bike as gym has one, but unable to  adjust seat so feet would stay on pedal or allow knee flexion.  Nustep seat 10 level 5 with BLEs & BUEs 8 min with cues to hold head up / upper body posture.  Standing with single UE support on locked stabilized rollator walker: green theraband 5 reps 2 sets ea exercise BUEs for row, forward reach & bicep curls.  Sitting on 24" bar stool with feet on floor: upright trunk without UE assist for 2 min with verbal & tactile cues to correct posture.     12/04/2022: Prosthetic Training with Transtibial Prostheses: PT reviewed recommendation to remove Tegaderm in evenings when done with the prostheses for the day and to wash / rinse them with soap and water.  He needs to have Tegaderm reapplied in the morning prior to donning prostheses.  Patient and CNA verbalized understanding. PT reviewed donning prostheses including pen alignment with donning liner, sliding forward at an angle to reduced chair resistance with posterior thigh and angling the prosthesis so that the pin and hole are at the same angle.  Patient and CNA verbalized better understanding.  Patient able to don prosthesis with less resistance. Patient ambulated 80 feet x 2 with rollator walker with cues on upright posture / position of rollator walker and orienting patellas forward to minimize hip external rotation.  Standing with posterior pelvis to sink flexing forward to place hands on block rollator seat back to upright with focus on posture and back extension.  Patient performed 2 reps leading down and up with left upper extremity and 2 reps with right upper extremity with CGA. Patient able to turn 180 degrees to sit on rollator seat with PT stabilizing rollator walker and minA.   HOME EXERCISE PROGRAM: Access Code: [email or web address] URL: [email or web address] Date: 12/16/2022 Prepared by: Vladimir Faster  Exercises - Seated Hamstring Stretch with Strap  - 1 x daily -  7 x weekly - 1 sets - 2-3 reps - 30 seconds hold - standing calf  stretch with forefoot on small step or brick  - 1 x daily - 7 x weekly - 1 sets - 2-3 reps - 30 seconds hold   Increasing your activity level is important.  Short distances which is walking from one room to another. Work to increase frequency back to prior level. Start with walk each hour during awake hours, then every 45-50 min, decreasing until he is moving some every 30 min during day   Medium distances are entering & exiting your home or community with limited distances. Start with 4 medium walks which is one outing to one location and increase number of tolerated amounts per day.  Long distance is your highest tolerance for you. Walk until you feel you must rest. Back or leg pain or general fatigue are indicators to maximum tolerance. Monitor by distance or time. Try to walk your BEST distance 1-2 times per day. You should see this increase over time.      ASSESSMENT:  CLINICAL IMPRESSION: Pt appears to basically understand HEP added today but will need review when wife present at next visit.  Patient continues to benefit from skilled PT.   OBJECTIVE IMPAIRMENTS: Abnormal gait, decreased activity tolerance, decreased balance, decreased endurance, decreased knowledge of use of DME, decreased mobility, difficulty walking, decreased ROM, decreased strength, increased edema, postural dysfunction, and prosthetic dependency .   ACTIVITY LIMITATIONS: carrying, standing, stairs, transfers, locomotion level, and standing ADLs  PARTICIPATION LIMITATIONS: community activity and household mobility  PERSONAL FACTORS: Age, Fitness, Past/current experiences, Time since onset of injury/illness/exacerbation, and 3+ comorbidities: see PMH  are also affecting patient's functional outcome.   REHAB POTENTIAL: Good  CLINICAL DECISION MAKING: Evolving/moderate complexity  EVALUATION COMPLEXITY: Moderate   GOALS: Goals reviewed with patient? Yes  SHORT TERM GOALS: Target date: 01/02/2023  Patient  verbalizes proper skin care & checking limbs.  Baseline: SEE OBJECTIVE DATA Goal status: Ongoing 12/11/2022 2.  Patient tolerates prostheses >12 hrs total /day without skin issues or limb pain Baseline: SEE OBJECTIVE DATA Goal status: Ongoing 12/11/2022  3.  Patient able to reach 10" and look over both shoulders with single UE support on RW safely. Baseline: SEE OBJECTIVE DATA Goal status: Ongoing 12/11/2022  4. Patient ambulates 200' with RW & prostheses with supervision. Baseline: SEE OBJECTIVE DATA Goal status: Ongoing 12/11/2022  5. Patient negotiates ramps & curbs with RW & prostheses with minA. Baseline: SEE OBJECTIVE DATA Goal status: Ongoing 12/11/2022  LONG TERM GOALS: Target date: 01/30/2023  Patient demonstrates & verbalized understanding of prosthetic care to enable safe utilization of prosthesis. Baseline: SEE OBJECTIVE DATA Goal status:   Ongoing 12/11/2022  Patient tolerates prostheses wear >90% of awake hours without skin or limb pain issues. Baseline: SEE OBJECTIVE DATA Goal status: Ongoing 12/11/2022  Tasks of [employer] with RW support >/= 30/56 Baseline: SEE OBJECTIVE DATA Goal status: Ongoing 12/11/2022  Patient ambulates >300' with prosthesis & LRAD independently Baseline: SEE OBJECTIVE DATA Goal status: Ongoing 12/11/2022  Patient negotiates ramps, curbs & stairs with single rail with prostheses & LRAD modified independent. Baseline: SEE OBJECTIVE DATA Goal status: Ongoing 12/11/2022  Patient verbalizes & demonstrates understanding of how to properly use fitness equipment to return to gym. Baseline: SEE OBJECTIVE DATA Goal status:  Ongoing 12/11/2022  PLAN:  PT FREQUENCY: 2x/week  PT DURATION: 12 weeks  PLANNED INTERVENTIONS: Therapeutic exercises, Therapeutic activity, Neuromuscular re-education, Balance training, Gait training, Patient/Family education,  Self Care, Stair training, Prosthetic training, DME instructions, Aquatic Therapy, Re-evaluation, and  physical performance testing  PLAN FOR NEXT SESSION:   continue to Work towards STGs,  continue prosthetic gait with rollator walker. Balance activities and functional exercises to promote an upright trunk. Check HEP with back extension, hamstring & gastroc stretches.    Vladimir Faster, PT, DPT 12/16/2022, 2:44 PM

## 2022-12-18 ENCOUNTER — Telehealth: Payer: Self-pay | Admitting: Internal Medicine

## 2022-12-18 ENCOUNTER — Ambulatory Visit (INDEPENDENT_AMBULATORY_CARE_PROVIDER_SITE_OTHER): Payer: Medicare Other | Admitting: Physical Therapy

## 2022-12-18 ENCOUNTER — Encounter: Payer: Self-pay | Admitting: Physical Therapy

## 2022-12-18 DIAGNOSIS — R2681 Unsteadiness on feet: Secondary | ICD-10-CM

## 2022-12-18 DIAGNOSIS — M6281 Muscle weakness (generalized): Secondary | ICD-10-CM

## 2022-12-18 DIAGNOSIS — R293 Abnormal posture: Secondary | ICD-10-CM

## 2022-12-18 DIAGNOSIS — R2689 Other abnormalities of gait and mobility: Secondary | ICD-10-CM | POA: Diagnosis not present

## 2022-12-18 DIAGNOSIS — L98498 Non-pressure chronic ulcer of skin of other sites with other specified severity: Secondary | ICD-10-CM

## 2022-12-18 NOTE — Telephone Encounter (Signed)
PT is need of another gastroparesis. Please advise. Would like the procedure done at [facility]

## 2022-12-18 NOTE — Therapy (Signed)
OUTPATIENT PHYSICAL THERAPY PROSTHETIC TREATMENT   Patient Name: [name] MRN: [id number] DOB:1950/04/24, 72 y.o., male Today's Date: 12/18/2022  PCP: Adolph Pollack, FNP REFERRING PROVIDER: Aldean Baker, MD  END OF SESSION:  PT End of Session - 12/18/22 1100     Visit Number 9    Number of Visits 25    Date for PT Re-Evaluation 01/30/23    Authorization Type Medicare & BCBS supplement    Progress Note Due on Visit 10    PT Start Time 1100    PT Stop Time 1143    PT Time Calculation (min) 43 min    Equipment Utilized During Treatment Gait belt    Activity Tolerance Patient tolerated treatment well    Behavior During Therapy WFL for tasks assessed/performed                     Past Medical History:  Diagnosis Date   Acute osteomyelitis of metatarsal bone of left foot (HCC)    Ambulates with cane    straight cane   Anemia    Cervical myelopathy (HCC) 02/06/2018   Chronic kidney disease    dailysis M W F- home   Community acquired pneumonia of left lower lobe of lung 08/18/2021   Complication of anesthesia    Coronary artery disease    Dehiscence of amputation stump of left lower extremity (HCC)    Diabetes mellitus without complication (HCC)    type2   Diabetic foot ulcer (HCC)    Diabetic neuropathy (HCC) 02/06/2018   Dysrhythmia    Gait abnormality 08/24/2018   GERD (gastroesophageal reflux disease)     01/06/20- not current   Gout    History of blood transfusion    History of blood transfusion    History of kidney stones    passed stones   Hypercholesteremia    Hypertension    Hypothyroidism    Neuromuscular disorder (HCC)    neuropathy left leg and bilateral feet   Neuropathy    Partial nontraumatic amputation of foot, 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   PONV (postoperative nausea and vomiting)    Prostate cancer (HCC)    PVD (peripheral vascular disease) (HCC)    with amputations   Past Surgical History:  Procedure Laterality Date    A-FLUTTER ABLATION N/A 04/06/2020   Procedure: A-FLUTTER ABLATION;  Surgeon: Marinus Maw, MD;  Location: [facility] IN[facility];  Service: Cardiovascular;  Laterality: N/A;   AMPUTATION Left 12/25/2013   Procedure: AMPUTATION RAY LEFT 5TH RAY;  Surgeon: Nadara Mustard, MD;  Location: [facility] ORS;  Service: Orthopedics;  Laterality: Left;   AMPUTATION Right 12/15/2018   Procedure: AMPUTATION OF 4TH AND 5TH TOES RIGHT FOOT;  Surgeon: Nadara Mustard, MD;  Location: [facility] OR;  Service: Orthopedics;  Laterality: Right;   AMPUTATION Left 02/02/2021   Procedure: LEFT FOOT 4TH RAY AMPUTATION;  Surgeon: Nadara Mustard, MD;  Location: [facility] OR;  Service: Orthopedics;  Laterality: Left;   AMPUTATION Left 05/09/2021   Procedure: LEFT BELOW KNEE AMPUTATION;  Surgeon: Nadara Mustard, MD;  Location: [facility] OR;  Service: Orthopedics;  Laterality: Left;   AMPUTATION Right 07/26/2022   Procedure: RIGHT BELOW KNEE AMPUTATION;  Surgeon: Nadara Mustard, MD;  Location: [facility] OR;  Service: Orthopedics;  Laterality: Right;   APPLICATION OF WOUND VAC Right 12/15/2018   Procedure: APPLICATION OF WOUND VAC;  Surgeon: Nadara Mustard, MD;  Location: [facility] OR;  Service: Orthopedics;  Laterality: Right;  AV FISTULA PLACEMENT Left 03/25/2019   Procedure: LEFT ARM ARTERIOVENOUS (AV) FISTULA CREATION;  Surgeon: Nada Libman, MD;  Location: [facility] OR;  Service: Vascular;  Laterality: Left;   BACK SURGERY     BASCILIC VEIN TRANSPOSITION Left 05/20/2019   Procedure: SECOND STAGE LEFT BASCILIC VEIN TRANSPOSITION;  Surgeon: Nada Libman, MD;  Location: [facility] OR;  Service: Vascular;  Laterality: Left;   BIOPSY  06/22/2022   Procedure: BIOPSY;  Surgeon: Lemar Lofty., MD;  Location: [facility] ENDOSCOPY;  Service: Gastroenterology;;   CARDIAC CATHETERIZATION  02/17/2014   CHOLECYSTECTOMY     COLONOSCOPY  2011   in [place], Normal   COLONOSCOPY N/A 06/23/2022   Procedure: COLONOSCOPY;  Surgeon: Sherrilyn Rist, MD;  Location: [facility] ENDOSCOPY;  Service:  Gastroenterology;  Laterality: N/A;   CORONARY ARTERY BYPASS GRAFT  2008   CYSTOSCOPY N/A 06/29/2020   Procedure: CYSTOSCOPY WITH CLOT EVACUATION AND  FULGERATION;  Surgeon: Marcine Matar, MD;  Location: [facility] OR;  Service: Urology;  Laterality: N/A;   CYSTOSCOPY WITH FULGERATION Bilateral 06/17/2020   Procedure: CYSTOSCOPY,BILATERAL RETROGRADE, CLOT EVACUATION WITH FULGERATION OF THE BLADDER;  Surgeon: Jannifer Hick, MD;  Location: [facility] OR;  Service: Urology;  Laterality: Bilateral;   CYSTOSCOPY WITH FULGERATION N/A 06/28/2020   Procedure: CYSTOSCOPY WITH CLOT EVACUATION AND FULGERATION OF BLEEDERS;  Surgeon: Marcine Matar, MD;  Location: [facility] OR;  Service: Urology;  Laterality: N/A;  1 HR   ENTEROSCOPY N/A 06/22/2022   Procedure: ENTEROSCOPY;  Surgeon: Meridee Score Netty Starring., MD;  Location: [facility] ENDOSCOPY;  Service: Gastroenterology;  Laterality: N/A;   HEMOSTASIS CLIP PLACEMENT  06/22/2022   Procedure: HEMOSTASIS CLIP PLACEMENT;  Surgeon: Lemar Lofty., MD;  Location: [facility] ENDOSCOPY;  Service: Gastroenterology;;   HOT HEMOSTASIS N/A 06/22/2022   Procedure: HOT HEMOSTASIS (ARGON PLASMA COAGULATION/BICAP);  Surgeon: Lemar Lofty., MD;  Location: [facility] ENDOSCOPY;  Service: Gastroenterology;  Laterality: N/A;   I & D EXTREMITY Right 12/15/2018   Procedure: DEBRIDEMENT RIGHT FOOT;  Surgeon: Nadara Mustard, MD;  Location: [facility] OR;  Service: Orthopedics;  Laterality: Right;   I & D EXTREMITY Right 08/27/2019   Procedure: PARTIAL CUBOID EXCISION RIGHT FOOT;  Surgeon: Nadara Mustard, MD;  Location: [facility] OR;  Service: Orthopedics;  Laterality: Right;   I & D EXTREMITY Right 01/07/2020   Procedure: RIGHT FOOT EXCISION INFECTED BONE;  Surgeon: Nadara Mustard, MD;  Location: [facility] OR;  Service: Orthopedics;  Laterality: Right;   I & D EXTREMITY Right 05/17/2022   Procedure: IRRIGATION AND DEBRIDEMENT RIGHT FOOT ABSCESS;  Surgeon: Nadara Mustard, MD;  Location: [facility] OR;  Service: Orthopedics;  Laterality:  Right;   IR PARACENTESIS  10/11/2022   NECK SURGERY     novemver 2019   POLYPECTOMY  06/22/2022   Procedure: POLYPECTOMY;  Surgeon: Meridee Score Netty Starring., MD;  Location: [facility] ENDOSCOPY;  Service: Gastroenterology;;   POLYPECTOMY  06/23/2022   Procedure: POLYPECTOMY;  Surgeon: Sherrilyn Rist, MD;  Location: [facility] ENDOSCOPY;  Service: Gastroenterology;;   STUMP REVISION Left 06/27/2021   Procedure: REVISION LEFT BELOW KNEE AMPUTATION;  Surgeon: Nadara Mustard, MD;  Location: [facility] OR;  Service: Orthopedics;  Laterality: Left;   SUBMUCOSAL LIFTING INJECTION  06/22/2022   Procedure: SUBMUCOSAL LIFTING INJECTION;  Surgeon: Meridee Score Netty Starring., MD;  Location: [facility] ENDOSCOPY;  Service: Gastroenterology;;   SUBMUCOSAL TATTOO INJECTION  06/22/2022   Procedure: SUBMUCOSAL TATTOO INJECTION;  Surgeon: Lemar Lofty., MD;  Location: [facility] ENDOSCOPY;  Service: Gastroenterology;;  TRANSURETHRAL RESECTION OF PROSTATE N/A 06/28/2020   Procedure: TRANSURETHRAL RESECTION OF THE PROSTATE (TURP);  Surgeon: Marcine Matar, MD;  Location: [facility] OR;  Service: Urology;  Laterality: N/A;   WISDOM TOOTH EXTRACTION     Patient Active Problem List   Diagnosis Date Noted   Right below-knee amputee (HCC) 07/29/2022   Severe sepsis (HCC) 07/23/2022   Foot infection 07/22/2022   Acute respiratory failure (HCC) 07/22/2022   Anemia due to chronic blood loss 06/23/2022   Heme positive stool 06/23/2022   Benign neoplasm of cecum 06/23/2022   Benign neoplasm of transverse colon 06/23/2022   Benign neoplasm of descending colon 06/23/2022   Abscess of right foot 05/17/2022   Contraindication to anticoagulation therapy GU Bleed 03/13/2022   Paroxysmal atrial fibrillation (HCC) 08/18/2021   Anemia of chronic renal failure 08/18/2021   Thrombocytopenia (HCC) 08/18/2021   Acute metabolic encephalopathy 08/18/2021   Action induced myoclonus 08/18/2021   Left below-knee amputee (HCC) 05/11/2021   Symptomatic anemia  09/13/2020   COVID-19 virus infection 09/13/2020   Pressure injury of skin 06/29/2020   Gross hematuria 06/16/2020   Typical atrial flutter (HCC) 03/24/2020   Bilateral pleural effusion 02/24/2020   Healthcare maintenance 01/28/2020   Shortness of breath 01/28/2020   History of partial ray amputation of fourth toe of right foot (HCC) 09/08/2019   Chronic cough 06/25/2019   Cutaneous abscess of right foot    Subacute osteomyelitis, right ankle and foot (HCC) 12/14/2018   AKI (acute kidney injury) (HCC) 12/14/2018   ESRD on dialysis (HCC) 12/14/2018   S/P CABG (coronary artery bypass graft) 12/11/2018   Gait abnormality 08/24/2018   Diabetic neuropathy (HCC) 02/06/2018   Cervical myelopathy (HCC) 02/06/2018   Onychomycosis 10/30/2017   Other spondylosis with radiculopathy, cervical region 01/27/2017   Midfoot ulcer, right, limited to breakdown of skin (HCC) 11/15/2016   Lateral epicondylitis, left elbow 08/12/2016   Prostate cancer (HCC) 06/19/2016   Cellulitis of right foot 12/24/2015   Gout 09/14/2012   Hypertension associated with diabetes (HCC) 09/14/2012   Hypothyroidism 09/14/2012   CAD (coronary artery disease) 09/14/2012   Insulin dependent type 2 diabetes mellitus (HCC) 09/14/2012   Hyperlipidemia associated with type 2 diabetes mellitus (HCC)    Neuropathy (HCC)     ONSET DATE: 10/31/2022 right BKA prosthesis delivery  REFERRING DIAG:  Z61.096E (ICD-10-CM) - Below-knee amputation of left lower extremity, initial encounter (HCC)  A54.098J (ICD-10-CM) - Below-knee amputation of right lower extremity, initial encounter (HCC)   THERAPY DIAG:  Unsteadiness on feet  Other abnormalities of gait and mobility  Muscle weakness (generalized)  Abnormal posture  Non-pressure chronic ulcer of skin of other sites with other specified severity (HCC)  Rationale for Evaluation and Treatment: Rehabilitation  SUBJECTIVE:   SUBJECTIVE STATEMENT: His wife returned from trip and  needs instruction in what PT is recommending currently.   PERTINENT HISTORY: ESRD on home dialysis, HTN, HLD, CAD, DM2, PVD, prostate CA, cervical myelopathy,    PAIN:  Are you having pain? No  PRECAUTIONS: Fall and Other: No BP LUE  WEIGHT BEARING RESTRICTIONS: No  FALLS: Has patient fallen in last 6 months? Yes. Number of falls 1 his left prosthesis slipped off & denies injuries.   LIVING ENVIRONMENT: Lives with: lives with their spouse and CNA Mon-Fri 1:00-5:00 Lives in: House  Home Access: Ramped entrance Home layout:  3 level enters on middle floor which has bedroom & full bath. Stairs: downstairs has chair lift.  Has following equipment at home:  Single point cane, Walker - 2 wheeled, Environmental consultant - 4 wheeled, Wheelchair (manual), [employer], Grab bars, and Ramped entry  OCCUPATION: retired  PLOF: he was walking with RW with left prosthesis.    PATIENT GOALS:   to use 2 prostheses to walk with cane. He wants to bring garbage cans up sloped driveway.    OBJECTIVE:  COGNITION: Eval on 11/06/2022:  Overall cognitive status: Within functional limits for tasks assessed  POSTURE: Eval on 11/06/2022: rounded shoulders, forward head, flexed trunk , and weight shift left  LOWER EXTREMITY ROM:  ROM P:passive  A:active Right eval  Hip flexion   Hip extension   Hip abduction   Hip adduction   Hip internal rotation   Hip external rotation   Knee flexion   Knee extension seated P: -6*  Ankle dorsiflexion   Ankle plantarflexion   Ankle inversion   Ankle eversion    (Blank rows = not tested)  LOWER EXTREMITY MMT:  MMT Right eval Left eval  Hip flexion 4/5 4/5  Hip extension 3/5 3+5  Hip abduction 35 3+5  Hip adduction    Hip internal rotation    Hip external rotation    Knee flexion 3+/5 4/5  Knee extension 3+/5 4/5  Eval MMT gross seated & functional  (Blank rows = not tested)  TRANSFERS: Eval on 11/06/2022: Sit to stand: SBA 22" w/c using BUEs on armrests  requires RW for stabilization Stand to sit: SBA RW for stabilization to 22" w/c using BUEs on armrests  GAIT: 12/16/2022: Pt amb 101' (max tolerable distance) with rollator walker with supervision / verbal cues on posture including position within walker.    Eval on 11/06/2022: Gait pattern: step to pattern, decreased step length- Left, decreased stance time- Right, Right hip hike, Left hip hike, knee flexed in stance- Right, lateral hip instability, trunk flexed, and wide BOS Distance walked: 25' turning 180* CW & CCW Assistive device utilized: Environmental consultant - 2 wheeled and bilateral TTA prostheses Level of assistance: Min A  FUNCTIONAL TESTs:  Eval on 11/06/2022: Standing balance with RW support with close supervision: reaches 5" anteriorly, looks to side only when looking over shoulders and reaches towards floor within 10".    CURRENT PROSTHETIC WEAR ASSESSMENT: 11/06/2022:  Patient is independent with: prosthetic cleaning and ply sock cleaning Patient is dependent with: skin check, residual limb care, proper wear schedule/adjustment, and proper weight-bearing schedule/adjustment Donning prosthesis: SBA / verbal cues Doffing prosthesis: Modified independence Prosthetic wear tolerance: pt reports wearing left prosthesis daily all awake hours for several months.  Right prosthesis has worn daily for 6 days since receiving it for most of awake hours.  Prosthetic weight bearing tolerance: 5 minutes with no c/o pain Edema: right limb has pitting edema with 5 sec capillary refill.  Left limb has no edema noted. Residual limb condition: Left limb has superficial wound 5mm with white perimeter from excessive fluid & CNA reports some drainage;  Right limb has no open areas, distal area has redness & shiny skin, normal temperature, no hair growth.  Prosthetic description: silicon liners (9mm over tibia & 6mm posterior portion) with pin lock suspension, socket with ratchet suspension, flexible Keel  feet     TODAY'S TREATMENT:  DATE:  12/18/2022: Prosthetic Training with Bilateral Transtibial Prostheses:  PT reviewed with pt & wife use of Tegaderm over wounds with prostheses and Vivewear at night.  Sitting with feet abducted to minimize external rotation putting socket pressure on medial knee. Pt & wife verbalized understanding.  PT demo & verbal cues on use of nylon under liner proximal to knee to decrease itching from sweat & friction rub. Pt & wife verbalized understanding. Pt amb 105' with rollator walker with cues on upright posture & placement close to rollator with supervision.    Therapeutic Exercise: Hamstring & gastroc stretches 30 sec hold 3 reps BLEs. controlled trunk flexion to actively work back extensors placing hands on locked rollator seat then back to upright. 5 reps leading up & down with RUE & 5 reps with LUE.  PT educated pt &  wife with demo & verbal cues. Pt return demo & wife verbalized understanding.     12/16/2022: Prosthetic Training with Transtibial Prostheses: New wound superficial on medial femoral condyle 8mm diameter and 2nd new scratch near medial gastroc head (appears from scratching limb with fingernails). 2 previous wounds still present wound 7mm over patella and 2nd wound posterior gastroc belly area 3mm. No signs of infection.  Only wound over patella covered with Tegaderm. PT covered remaining wounds. PT educated on sitting posture with feet wide so he does not ER which prosthesis puts pressure on medial femoral condyle where he got new wound. Pt & CNA verbalized understanding.  Pt amb 101' (max tolerable distance) with rollator walker with supervision / verbal cues on posture including position within walker.    Therapeutic Exercise: Hamstring & gastroc stretches. See HEP below. PT educated with demo & verbal with HO. PT hand  written directions for controlled trunk flexion to actively work back extensors placing hands on locked rollator seat then back to upright. 5 reps leading up & down with RUE & 5 reps with LUE.  Pt verbalized and return demo understanding of  3 exercises.  Step up & step down on 4" block with locked rollator walker 1 rep ea leading RLE & leading LLE with minA.    12/11/2022: Prosthetic Training with Transtibial Prostheses: Right residual limb with wound 7mm over patella and 2nd wound posterior gastroc belly area 3mm. Other wounds have healed. No signs of infection.  Pt amb 80', 20' & 34' with rollator walker with cues for upright posture looking forward / not staring at floor and staying close to rollator walker.  Therapeutic Exercise: Attempted recumbent bike as gym has one, but unable to adjust seat so feet would stay on pedal or allow knee flexion.  Nustep seat 10 level 5 with BLEs & BUEs 8 min with cues to hold head up / upper body posture.  Standing with single UE support on locked stabilized rollator walker: green theraband 5 reps 2 sets ea exercise BUEs for row, forward reach & bicep curls.  Sitting on 24" bar stool with feet on floor: upright trunk without UE assist for 2 min with verbal & tactile cues to correct posture.    HOME EXERCISE PROGRAM: Access Code: [email or web address] URL: [email or web address] Date: 12/16/2022 Prepared by: Vladimir Faster  Exercises - Seated Hamstring Stretch with Strap  - 1 x daily - 7 x weekly - 1 sets - 2-3 reps - 30 seconds hold - standing calf stretch with forefoot on small step or brick  - 1 x daily - 7 x weekly - 1 sets - 2-3 reps - 30  seconds hold   Increasing your activity level is important.  Short distances which is walking from one room to another. Work to increase frequency back to prior level. Start with walk each hour during awake hours, then every 45-50 min, decreasing until he is moving some every 30 min during day   Medium  distances are entering & exiting your home or community with limited distances. Start with 4 medium walks which is one outing to one location and increase number of tolerated amounts per day.  Long distance is your highest tolerance for you. Walk until you feel you must rest. Back or leg pain or general fatigue are indicators to maximum tolerance. Monitor by distance or time. Try to walk your BEST distance 1-2 times per day. You should see this increase over time.      ASSESSMENT:  CLINICAL IMPRESSION: PT reviewed skin care recommendations, HEP and gait with pt & wife which both appear to understand.     Patient continues to benefit from skilled PT.   OBJECTIVE IMPAIRMENTS: Abnormal gait, decreased activity tolerance, decreased balance, decreased endurance, decreased knowledge of use of DME, decreased mobility, difficulty walking, decreased ROM, decreased strength, increased edema, postural dysfunction, and prosthetic dependency .   ACTIVITY LIMITATIONS: carrying, standing, stairs, transfers, locomotion level, and standing ADLs  PARTICIPATION LIMITATIONS: community activity and household mobility  PERSONAL FACTORS: Age, Fitness, Past/current experiences, Time since onset of injury/illness/exacerbation, and 3+ comorbidities: see PMH  are also affecting patient's functional outcome.   REHAB POTENTIAL: Good  CLINICAL DECISION MAKING: Evolving/moderate complexity  EVALUATION COMPLEXITY: Moderate   GOALS: Goals reviewed with patient? Yes  SHORT TERM GOALS: Target date: 01/02/2023  Patient verbalizes proper skin care & checking limbs.  Baseline: SEE OBJECTIVE DATA Goal status: Ongoing 12/11/2022 2.  Patient tolerates prostheses >12 hrs total /day without skin issues or limb pain Baseline: SEE OBJECTIVE DATA Goal status: Ongoing 12/11/2022  3.  Patient able to reach 10" and look over both shoulders with single UE support on RW safely. Baseline: SEE OBJECTIVE DATA Goal status: Ongoing  12/11/2022  4. Patient ambulates 200' with RW & prostheses with supervision. Baseline: SEE OBJECTIVE DATA Goal status: Ongoing 12/11/2022  5. Patient negotiates ramps & curbs with RW & prostheses with minA. Baseline: SEE OBJECTIVE DATA Goal status: Ongoing 12/11/2022  LONG TERM GOALS: Target date: 01/30/2023  Patient demonstrates & verbalized understanding of prosthetic care to enable safe utilization of prosthesis. Baseline: SEE OBJECTIVE DATA Goal status:   Ongoing 12/11/2022  Patient tolerates prostheses wear >90% of awake hours without skin or limb pain issues. Baseline: SEE OBJECTIVE DATA Goal status: Ongoing 12/11/2022  Tasks of [employer] with RW support >/= 30/56 Baseline: SEE OBJECTIVE DATA Goal status: Ongoing 12/11/2022  Patient ambulates >300' with prosthesis & LRAD independently Baseline: SEE OBJECTIVE DATA Goal status: Ongoing 12/11/2022  Patient negotiates ramps, curbs & stairs with single rail with prostheses & LRAD modified independent. Baseline: SEE OBJECTIVE DATA Goal status: Ongoing 12/11/2022  Patient verbalizes & demonstrates understanding of how to properly use fitness equipment to return to gym. Baseline: SEE OBJECTIVE DATA Goal status:  Ongoing 12/11/2022  PLAN:  PT FREQUENCY: 2x/week  PT DURATION: 12 weeks  PLANNED INTERVENTIONS: Therapeutic exercises, Therapeutic activity, Neuromuscular re-education, Balance training, Gait training, Patient/Family education, Self Care, Stair training, Prosthetic training, DME instructions, Aquatic Therapy, Re-evaluation, and physical performance testing  PLAN FOR NEXT SESSION:   do 10th visit progress note,  continue to Work towards STGs,  continue prosthetic gait with rollator  walker. Balance activities and functional exercises to promote an upright trunk. Strengthening especially core & LEs    Vladimir Faster, PT, DPT 12/18/2022, 1:22 PM

## 2022-12-19 ENCOUNTER — Other Ambulatory Visit: Payer: Self-pay

## 2022-12-19 DIAGNOSIS — R188 Other ascites: Secondary | ICD-10-CM

## 2022-12-19 NOTE — Telephone Encounter (Signed)
1.  Okay to order large-volume paracentesis. 2.  IV albumin replacement 3.  Send fluid for cell count with differential 4.  His ascites is not felt to be on the basis of liver disease.  He sees his nephrology team regularly for dialysis.  I previously recommended that he notify his dialysis team when he feels like a paracentesis would be beneficial for comfort.  Moving forward, it would be best if they organize his, as needed paracenteses. Give the patient my best. Thanks, Dr. Marina Goodell

## 2022-12-19 NOTE — Telephone Encounter (Signed)
Spoke with pt and he is aware of Dr. Lamar Sprinkles recommendations. Pt scheduled for IR para at [facility] 12/24/22 at 9am, pt to arrive at 8:30am. Pt aware of appt.

## 2022-12-19 NOTE — Telephone Encounter (Signed)
Patient  calling requesting to have a paracentesis. Asked pt is he had noticed a wt gain, reports he has noticed his belly is much bigger than it usually is and that his clothes are fitting much tighter and he is uncomfortable. Please advise

## 2022-12-19 NOTE — Telephone Encounter (Signed)
PT returning call. Please advise.

## 2022-12-19 NOTE — Telephone Encounter (Signed)
Left message for pt to call back  °

## 2022-12-23 ENCOUNTER — Ambulatory Visit (INDEPENDENT_AMBULATORY_CARE_PROVIDER_SITE_OTHER): Payer: Medicare Other | Admitting: Physical Therapy

## 2022-12-23 ENCOUNTER — Encounter: Payer: Self-pay | Admitting: Physical Therapy

## 2022-12-23 DIAGNOSIS — R2681 Unsteadiness on feet: Secondary | ICD-10-CM | POA: Diagnosis not present

## 2022-12-23 DIAGNOSIS — R293 Abnormal posture: Secondary | ICD-10-CM | POA: Diagnosis not present

## 2022-12-23 DIAGNOSIS — M6281 Muscle weakness (generalized): Secondary | ICD-10-CM

## 2022-12-23 DIAGNOSIS — R2689 Other abnormalities of gait and mobility: Secondary | ICD-10-CM | POA: Diagnosis not present

## 2022-12-23 DIAGNOSIS — L98498 Non-pressure chronic ulcer of skin of other sites with other specified severity: Secondary | ICD-10-CM

## 2022-12-23 NOTE — Therapy (Signed)
OUTPATIENT PHYSICAL THERAPY PROSTHETIC TREATMENT/Progress note Progress Note reporting period 11/06/22 to 12/23/22  See below for objective and subjective measurements relating to patients progress with PT.    Patient Name: [name] MRN: [id number] DOB:January 06, 1951, 72 y.o., male Today's Date: 12/23/2022  PCP: Adolph Pollack, FNP REFERRING PROVIDER: Aldean Baker, MD  END OF SESSION:  PT End of Session - 12/23/22 1500     Visit Number 10    Number of Visits 25    Date for PT Re-Evaluation 01/30/23    Authorization Type Medicare & BCBS supplement    Progress Note Due on Visit 20    PT Start Time 1434    PT Stop Time 1515    PT Time Calculation (min) 41 min    Equipment Utilized During Treatment Gait belt    Activity Tolerance Patient tolerated treatment well    Behavior During Therapy WFL for tasks assessed/performed                      Past Medical History:  Diagnosis Date   Acute osteomyelitis of metatarsal bone of left foot (HCC)    Ambulates with cane    straight cane   Anemia    Cervical myelopathy (HCC) 02/06/2018   Chronic kidney disease    dailysis M W F- home   Community acquired pneumonia of left lower lobe of lung 08/18/2021   Complication of anesthesia    Coronary artery disease    Dehiscence of amputation stump of left lower extremity (HCC)    Diabetes mellitus without complication (HCC)    type2   Diabetic foot ulcer (HCC)    Diabetic neuropathy (HCC) 02/06/2018   Dysrhythmia    Gait abnormality 08/24/2018   GERD (gastroesophageal reflux disease)     01/06/20- not current   Gout    History of blood transfusion    History of blood transfusion    History of kidney stones    passed stones   Hypercholesteremia    Hypertension    Hypothyroidism    Neuromuscular disorder (HCC)    neuropathy left leg and bilateral feet   Neuropathy    Partial nontraumatic amputation of foot, 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   PONV (postoperative nausea and  vomiting)    Prostate cancer (HCC)    PVD (peripheral vascular disease) (HCC)    with amputations   Past Surgical History:  Procedure Laterality Date   A-FLUTTER ABLATION N/A 04/06/2020   Procedure: A-FLUTTER ABLATION;  Surgeon: Marinus Maw, MD;  Location: [facility] INV[facility];  Service: Cardiovascular;  Laterality: N/A;   AMPUTATION Left 12/25/2013   Procedure: AMPUTATION RAY LEFT 5TH RAY;  Surgeon: Nadara Mustard, MD;  Location: [facility] ORS;  Service: Orthopedics;  Laterality: Left;   AMPUTATION Right 12/15/2018   Procedure: AMPUTATION OF 4TH AND 5TH TOES RIGHT FOOT;  Surgeon: Nadara Mustard, MD;  Location: [facility] OR;  Service: Orthopedics;  Laterality: Right;   AMPUTATION Left 02/02/2021   Procedure: LEFT FOOT 4TH RAY AMPUTATION;  Surgeon: Nadara Mustard, MD;  Location: [facility] OR;  Service: Orthopedics;  Laterality: Left;   AMPUTATION Left 05/09/2021   Procedure: LEFT BELOW KNEE AMPUTATION;  Surgeon: Nadara Mustard, MD;  Location: [facility] OR;  Service: Orthopedics;  Laterality: Left;   AMPUTATION Right 07/26/2022   Procedure: RIGHT BELOW KNEE AMPUTATION;  Surgeon: Nadara Mustard, MD;  Location: [facility] OR;  Service: Orthopedics;  Laterality: Right;   APPLICATION OF WOUND VAC Right  12/15/2018   Procedure: APPLICATION OF WOUND VAC;  Surgeon: Nadara Mustard, MD;  Location: [facility] OR;  Service: Orthopedics;  Laterality: Right;   AV FISTULA PLACEMENT Left 03/25/2019   Procedure: LEFT ARM ARTERIOVENOUS (AV) FISTULA CREATION;  Surgeon: Nada Libman, MD;  Location: [facility] OR;  Service: Vascular;  Laterality: Left;   BACK SURGERY     BASCILIC VEIN TRANSPOSITION Left 05/20/2019   Procedure: SECOND STAGE LEFT BASCILIC VEIN TRANSPOSITION;  Surgeon: Nada Libman, MD;  Location: [facility] OR;  Service: Vascular;  Laterality: Left;   BIOPSY  06/22/2022   Procedure: BIOPSY;  Surgeon: Lemar Lofty., MD;  Location: [facility] ENDOSCOPY;  Service: Gastroenterology;;   CARDIAC CATHETERIZATION  02/17/2014   CHOLECYSTECTOMY     COLONOSCOPY   2011   in [place], Normal   COLONOSCOPY N/A 06/23/2022   Procedure: COLONOSCOPY;  Surgeon: Sherrilyn Rist, MD;  Location: [facility] ENDOSCOPY;  Service: Gastroenterology;  Laterality: N/A;   CORONARY ARTERY BYPASS GRAFT  2008   CYSTOSCOPY N/A 06/29/2020   Procedure: CYSTOSCOPY WITH CLOT EVACUATION AND  FULGERATION;  Surgeon: Marcine Matar, MD;  Location: [facility] OR;  Service: Urology;  Laterality: N/A;   CYSTOSCOPY WITH FULGERATION Bilateral 06/17/2020   Procedure: CYSTOSCOPY,BILATERAL RETROGRADE, CLOT EVACUATION WITH FULGERATION OF THE BLADDER;  Surgeon: Jannifer Hick, MD;  Location: [facility] OR;  Service: Urology;  Laterality: Bilateral;   CYSTOSCOPY WITH FULGERATION N/A 06/28/2020   Procedure: CYSTOSCOPY WITH CLOT EVACUATION AND FULGERATION OF BLEEDERS;  Surgeon: Marcine Matar, MD;  Location: [facility] OR;  Service: Urology;  Laterality: N/A;  1 HR   ENTEROSCOPY N/A 06/22/2022   Procedure: ENTEROSCOPY;  Surgeon: Meridee Score Netty Starring., MD;  Location: [facility]OSCOPY;  Service: Gastroenterology;  Laterality: N/A;   HEMOSTASIS CLIP PLACEMENT  06/22/2022   Procedure: HEMOSTASIS CLIP PLACEMENT;  Surgeon: Lemar Lofty., MD;  Location: [facility] ENDOSCOPY;  Service: Gastroenterology;;   HOT HEMOSTASIS N/A 06/22/2022   Procedure: HOT HEMOSTASIS (ARGON PLASMA COAGULATION/BICAP);  Surgeon: Lemar Lofty., MD;  Location: [facility] ENDOSCOPY;  Service: Gastroenterology;  Laterality: N/A;   I & D EXTREMITY Right 12/15/2018   Procedure: DEBRIDEMENT RIGHT FOOT;  Surgeon: Nadara Mustard, MD;  Location: [facility] OR;  Service: Orthopedics;  Laterality: Right;   I & D EXTREMITY Right 08/27/2019   Procedure: PARTIAL CUBOID EXCISION RIGHT FOOT;  Surgeon: Nadara Mustard, MD;  Location: [facility] OR;  Service: Orthopedics;  Laterality: Right;   I & D EXTREMITY Right 01/07/2020   Procedure: RIGHT FOOT EXCISION INFECTED BONE;  Surgeon: Nadara Mustard, MD;  Location: [facility] OR;  Service: Orthopedics;  Laterality: Right;   I & D EXTREMITY Right  05/17/2022   Procedure: IRRIGATION AND DEBRIDEMENT RIGHT FOOT ABSCESS;  Surgeon: Nadara Mustard, MD;  Location: [facility] OR;  Service: Orthopedics;  Laterality: Right;   IR PARACENTESIS  10/11/2022   NECK SURGERY     novemver 2019   POLYPECTOMY  06/22/2022   Procedure: POLYPECTOMY;  Surgeon: Meridee Score Netty Starring., MD;  Location: [facility] ENDOSCOPY;  Service: Gastroenterology;;   POLYPECTOMY  06/23/2022   Procedure: POLYPECTOMY;  Surgeon: Sherrilyn Rist, MD;  Location: [facility] ENDOSCOPY;  Service: Gastroenterology;;   STUMP REVISION Left 06/27/2021   Procedure: REVISION LEFT BELOW KNEE AMPUTATION;  Surgeon: Nadara Mustard, MD;  Location: [facility] OR;  Service: Orthopedics;  Laterality: Left;   SUBMUCOSAL LIFTING INJECTION  06/22/2022   Procedure: SUBMUCOSAL LIFTING INJECTION;  Surgeon: Meridee Score Netty Starring., MD;  Location: [facility] ENDOSCOPY;  Service: Gastroenterology;;  SUBMUCOSAL TATTOO INJECTION  06/22/2022   Procedure: SUBMUCOSAL TATTOO INJECTION;  Surgeon: Lemar Lofty., MD;  Location: [facility] ENDOSCOPY;  Service: Gastroenterology;;   TRANSURETHRAL RESECTION OF PROSTATE N/A 06/28/2020   Procedure: TRANSURETHRAL RESECTION OF THE PROSTATE (TURP);  Surgeon: Marcine Matar, MD;  Location: [facility] OR;  Service: Urology;  Laterality: N/A;   WISDOM TOOTH EXTRACTION     Patient Active Problem List   Diagnosis Date Noted   Right below-knee amputee (HCC) 07/29/2022   Severe sepsis (HCC) 07/23/2022   Foot infection 07/22/2022   Acute respiratory failure (HCC) 07/22/2022   Anemia due to chronic blood loss 06/23/2022   Heme positive stool 06/23/2022   Benign neoplasm of cecum 06/23/2022   Benign neoplasm of transverse colon 06/23/2022   Benign neoplasm of descending colon 06/23/2022   Abscess of right foot 05/17/2022   Contraindication to anticoagulation therapy GU Bleed 03/13/2022   Paroxysmal atrial fibrillation (HCC) 08/18/2021   Anemia of chronic renal failure 08/18/2021   Thrombocytopenia (HCC) 08/18/2021    Acute metabolic encephalopathy 08/18/2021   Action induced myoclonus 08/18/2021   Left below-knee amputee (HCC) 05/11/2021   Symptomatic anemia 09/13/2020   COVID-19 virus infection 09/13/2020   Pressure injury of skin 06/29/2020   Gross hematuria 06/16/2020   Typical atrial flutter (HCC) 03/24/2020   Bilateral pleural effusion 02/24/2020   Healthcare maintenance 01/28/2020   Shortness of breath 01/28/2020   History of partial ray amputation of fourth toe of right foot (HCC) 09/08/2019   Chronic cough 06/25/2019   Cutaneous abscess of right foot    Subacute osteomyelitis, right ankle and foot (HCC) 12/14/2018   AKI (acute kidney injury) (HCC) 12/14/2018   ESRD on dialysis (HCC) 12/14/2018   S/P CABG (coronary artery bypass graft) 12/11/2018   Gait abnormality 08/24/2018   Diabetic neuropathy (HCC) 02/06/2018   Cervical myelopathy (HCC) 02/06/2018   Onychomycosis 10/30/2017   Other spondylosis with radiculopathy, cervical region 01/27/2017   Midfoot ulcer, right, limited to breakdown of skin (HCC) 11/15/2016   Lateral epicondylitis, left elbow 08/12/2016   Prostate cancer (HCC) 06/19/2016   Cellulitis of right foot 12/24/2015   Gout 09/14/2012   Hypertension associated with diabetes (HCC) 09/14/2012   Hypothyroidism 09/14/2012   CAD (coronary artery disease) 09/14/2012   Insulin dependent type 2 diabetes mellitus (HCC) 09/14/2012   Hyperlipidemia associated with type 2 diabetes mellitus (HCC)    Neuropathy (HCC)     ONSET DATE: 10/31/2022 right BKA prosthesis delivery  REFERRING DIAG:  Q65.784O (ICD-10-CM) - Below-knee amputation of left lower extremity, initial encounter (HCC)  N62.952W (ICD-10-CM) - Below-knee amputation of right lower extremity, initial encounter (HCC)   THERAPY DIAG:  Unsteadiness on feet  Other abnormalities of gait and mobility  Muscle weakness (generalized)  Abnormal posture  Non-pressure chronic ulcer of skin of other sites with other  specified severity (HCC)  Rationale for Evaluation and Treatment: Rehabilitation  SUBJECTIVE:   SUBJECTIVE STATEMENT: He states not much knee pain upon arrival but his walking has been limited by shoulder pain.   PERTINENT HISTORY: ESRD on home dialysis, HTN, HLD, CAD, DM2, PVD, prostate CA, cervical myelopathy,    PAIN:  Are you having pain? No  PRECAUTIONS: Fall and Other: No BP LUE  WEIGHT BEARING RESTRICTIONS: No  FALLS: Has patient fallen in last 6 months? Yes. Number of falls 1 his left prosthesis slipped off & denies injuries.   LIVING ENVIRONMENT: Lives with: lives with their spouse and CNA Mon-Fri 1:00-5:00 Lives in: [facility]  Access: Ramped entrance Home layout:  3 level enters on middle floor which has bedroom & full bath. Stairs: downstairs has chair lift.  Has following equipment at home: Single point cane, Walker - 2 wheeled, Environmental consultant - 4 wheeled, Wheelchair (manual), [employer], Grab bars, and Ramped entry  OCCUPATION: retired  PLOF: he was walking with RW with left prosthesis.    PATIENT GOALS:   to use 2 prostheses to walk with cane. He wants to bring garbage cans up sloped driveway.    OBJECTIVE:  COGNITION: Eval on 11/06/2022:  Overall cognitive status: Within functional limits for tasks assessed  POSTURE: Eval on 11/06/2022: rounded shoulders, forward head, flexed trunk , and weight shift left  LOWER EXTREMITY ROM:  ROM P:passive  A:active Right eval  Hip flexion   Hip extension   Hip abduction   Hip adduction   Hip internal rotation   Hip external rotation   Knee flexion   Knee extension seated P: -6*  Ankle dorsiflexion   Ankle plantarflexion   Ankle inversion   Ankle eversion    (Blank rows = not tested)  LOWER EXTREMITY MMT:  MMT Right eval Left eval Rt/Lt 12/23/22  Hip flexion 4/5 4/5 4+/4+  Hip extension 3/5 3+5 4/4  Hip abduction 35 3+5 4/4  Hip adduction     Hip internal rotation     Hip external rotation     Knee  flexion 3+/5 4/5 4/4  Knee extension 3+/5 4/5 4+/4+  Eval MMT gross seated & functional  (Blank rows = not tested)  TRANSFERS: Eval on 11/06/2022: Sit to stand: SBA 22" w/c using BUEs on armrests requires RW for stabilization Stand to sit: SBA RW for stabilization to 22" w/c using BUEs on armrests  GAIT: 12/16/2022: Pt amb 101' (max tolerable distance) with rollator walker with supervision / verbal cues on posture including position within walker.    Eval on 11/06/2022: Gait pattern: step to pattern, decreased step length- Left, decreased stance time- Right, Right hip hike, Left hip hike, knee flexed in stance- Right, lateral hip instability, trunk flexed, and wide BOS Distance walked: 25' turning 180* CW & CCW Assistive device utilized: Environmental consultant - 2 wheeled and bilateral TTA prostheses Level of assistance: Min A  FUNCTIONAL TESTs:  Eval on 11/06/2022: Standing balance with RW support with close supervision: reaches 5" anteriorly, looks to side only when looking over shoulders and reaches towards floor within 10".    CURRENT PROSTHETIC WEAR ASSESSMENT: 11/06/2022:  Patient is independent with: prosthetic cleaning and ply sock cleaning Patient is dependent with: skin check, residual limb care, proper wear schedule/adjustment, and proper weight-bearing schedule/adjustment Donning prosthesis: SBA / verbal cues Doffing prosthesis: Modified independence Prosthetic wear tolerance: pt reports wearing left prosthesis daily all awake hours for several months.  Right prosthesis has worn daily for 6 days since receiving it for most of awake hours.  Prosthetic weight bearing tolerance: 5 minutes with no c/o pain Edema: right limb has pitting edema with 5 sec capillary refill.  Left limb has no edema noted. Residual limb condition: Left limb has superficial wound 5mm with white perimeter from excessive fluid & CNA reports some drainage;  Right limb has no open areas, distal area has redness & shiny  skin, normal temperature, no hair growth.  Prosthetic description: silicon liners (9mm over tibia & 6mm posterior portion) with pin lock suspension, socket with ratchet suspension, flexible Keel feet     TODAY'S TREATMENT:  DATE:  12/23/2022: Prosthetic Training with Bilateral Transtibial Prostheses:  PT reviewed with Pt wound care instructions and he verbalized understanding.  Pt amb 150' max tolerated distance with rollator.  Updated measurements and goals for progress note   Therapeutic Exercise: Hamstring & gastroc stretches 30 sec hold 3 reps BLEs. controlled trunk flexion to actively work back extensors placing hands on locked rollator seat then back to upright. 5 reps leading up & down with RUE & 5 reps with LUE.    Therapeutic activity: Pt ambulated up/down 5 stairs in clinic hallway with bilat UE support, min to mod A overall. He needed seated rest break at landing after the 5 steps, then had +2 assistance for safety to descend back down.  12/18/2022: Prosthetic Training with Bilateral Transtibial Prostheses:  PT reviewed with pt & wife use of Tegaderm over wounds with prostheses and Vivewear at night.  Sitting with feet abducted to minimize external rotation putting socket pressure on medial knee. Pt & wife verbalized understanding.  PT demo & verbal cues on use of nylon under liner proximal to knee to decrease itching from sweat & friction rub. Pt & wife verbalized understanding. Pt amb 105' with rollator walker with cues on upright posture & placement close to rollator with supervision.    Therapeutic Exercise: Hamstring & gastroc stretches 30 sec hold 3 reps BLEs. controlled trunk flexion to actively work back extensors placing hands on locked rollator seat then back to upright. 5 reps leading up & down with RUE & 5 reps with LUE.  PT educated pt &  wife  with demo & verbal cues. Pt return demo & wife verbalized understanding.     12/16/2022: Prosthetic Training with Transtibial Prostheses: New wound superficial on medial femoral condyle 8mm diameter and 2nd new scratch near medial gastroc head (appears from scratching limb with fingernails). 2 previous wounds still present wound 7mm over patella and 2nd wound posterior gastroc belly area 3mm. No signs of infection.  Only wound over patella covered with Tegaderm. PT covered remaining wounds. PT educated on sitting posture with feet wide so he does not ER which prosthesis puts pressure on medial femoral condyle where he got new wound. Pt & CNA verbalized understanding.  Pt amb 101' (max tolerable distance) with rollator walker with supervision / verbal cues on posture including position within walker.    Therapeutic Exercise: Hamstring & gastroc stretches. See HEP below. PT educated with demo & verbal with HO. PT hand written directions for controlled trunk flexion to actively work back extensors placing hands on locked rollator seat then back to upright. 5 reps leading up & down with RUE & 5 reps with LUE.  Pt verbalized and return demo understanding of  3 exercises.  Step up & step down on 4" block with locked rollator walker 1 rep ea leading RLE & leading LLE with minA.    12/11/2022: Prosthetic Training with Transtibial Prostheses: Right residual limb with wound 7mm over patella and 2nd wound posterior gastroc belly area 3mm. Other wounds have healed. No signs of infection.  Pt amb 80', 20' & 70' with rollator walker with cues for upright posture looking forward / not staring at floor and staying close to rollator walker.  Therapeutic Exercise: Attempted recumbent bike as gym has one, but unable to adjust seat so feet would stay on pedal or allow knee flexion.  Nustep seat 10 level 5 with BLEs & BUEs 8 min with cues to hold head up / upper body posture.  Standing with single UE support on  locked stabilized rollator walker: green theraband 5 reps 2 sets ea exercise BUEs for row, forward reach & bicep curls.  Sitting on 24" bar stool with feet on floor: upright trunk without UE assist for 2 min with verbal & tactile cues to correct posture.    HOME EXERCISE PROGRAM: Access Code: [email or web address] URL: [email or web address] Date: 12/16/2022 Prepared by: Vladimir Faster  Exercises - Seated Hamstring Stretch with Strap  - 1 x daily - 7 x weekly - 1 sets - 2-3 reps - 30 seconds hold - standing calf stretch with forefoot on small step or brick  - 1 x daily - 7 x weekly - 1 sets - 2-3 reps - 30 seconds hold   Increasing your activity level is important.  Short distances which is walking from one room to another. Work to increase frequency back to prior level. Start with walk each hour during awake hours, then every 45-50 min, decreasing until he is moving some every 30 min during day   Medium distances are entering & exiting your home or community with limited distances. Start with 4 medium walks which is one outing to one location and increase number of tolerated amounts per day.  Long distance is your highest tolerance for you. Walk until you feel you must rest. Back or leg pain or general fatigue are indicators to maximum tolerance. Monitor by distance or time. Try to walk your BEST distance 1-2 times per day. You should see this increase over time.      ASSESSMENT:  CLINICAL IMPRESSION: 10th visit progress note shows he is making overall functional progress. He has met 4/5 of his short term PT goals. PT recommending to continue with current plan of care working on improving strength, endurance, balance, gait, and stairs.   OBJECTIVE IMPAIRMENTS: Abnormal gait, decreased activity tolerance, decreased balance, decreased endurance, decreased knowledge of use of DME, decreased mobility, difficulty walking, decreased ROM, decreased strength, increased edema, postural  dysfunction, and prosthetic dependency .   ACTIVITY LIMITATIONS: carrying, standing, stairs, transfers, locomotion level, and standing ADLs  PARTICIPATION LIMITATIONS: community activity and household mobility  PERSONAL FACTORS: Age, Fitness, Past/current experiences, Time since onset of injury/illness/exacerbation, and 3+ comorbidities: see PMH  are also affecting patient's functional outcome.   REHAB POTENTIAL: Good  CLINICAL DECISION MAKING: Evolving/moderate complexity  EVALUATION COMPLEXITY: Moderate   GOALS: Goals reviewed with patient? Yes  SHORT TERM GOALS: Target date: 01/02/2023  Patient verbalizes proper skin care & checking limbs.  Baseline: SEE OBJECTIVE DATA Goal status: MET 12/23/22 2.  Patient tolerates prostheses >12 hrs total /day without skin issues or limb pain Baseline: SEE OBJECTIVE DATA Goal status: MET 12/23/22, he reports he wears them all day long  3.  Patient able to reach 10" and look over both shoulders with single UE support on RW safely. Baseline: SEE OBJECTIVE DATA Goal status: MET 12/23/22, was able to reach 11 inches  4. Patient ambulates 200' with RW & prostheses with supervision. Baseline: SEE OBJECTIVE DATA Goal status: Ongoing 12/23/2022, 150 feet was max tolerated distance today  5. Patient negotiates ramps & curbs with RW & prostheses with minA. Baseline: SEE OBJECTIVE DATA Goal status: MET 12/23/22  LONG TERM GOALS: Target date: 01/30/2023  Patient demonstrates & verbalized understanding of prosthetic care to enable safe utilization of prosthesis. Baseline: SEE OBJECTIVE DATA Goal status:   Ongoing 12/11/2022  Patient tolerates prostheses wear >90% of awake hours without skin or limb pain issues. Baseline: SEE  OBJECTIVE DATA Goal status: Ongoing 12/11/2022  Tasks of [employer] with RW support >/= 30/56 Baseline: SEE OBJECTIVE DATA Goal status: Ongoing 12/11/2022  Patient ambulates >300' with prosthesis & LRAD independently Baseline:  SEE OBJECTIVE DATA Goal status: Ongoing 12/11/2022  Patient negotiates ramps, curbs & stairs with single rail with prostheses & LRAD modified independent. Baseline: SEE OBJECTIVE DATA Goal status: Ongoing 12/11/2022  Patient verbalizes & demonstrates understanding of how to properly use fitness equipment to return to gym. Baseline: SEE OBJECTIVE DATA Goal status:  Ongoing 12/11/2022  PLAN:  PT FREQUENCY: 2x/week  PT DURATION: 12 weeks  PLANNED INTERVENTIONS: Therapeutic exercises, Therapeutic activity, Neuromuscular re-education, Balance training, Gait training, Patient/Family education, Self Care, Stair training, Prosthetic training, DME instructions, Aquatic Therapy, Re-evaluation, and physical performance testing  PLAN FOR NEXT SESSION:    continue to Work towards STGs,  continue prosthetic gait with rollator walker. Balance activities and functional exercises to promote an upright trunk. Strengthening especially core & LEs    April Manson, PT, DPT 12/23/2022, 4:02 PM

## 2022-12-24 ENCOUNTER — Ambulatory Visit (HOSPITAL_COMMUNITY)
Admission: RE | Admit: 2022-12-24 | Discharge: 2022-12-24 | Disposition: A | Discharge status: Home / Self Care | Payer: Medicare Other | Source: Ambulatory Visit | Attending: Internal Medicine | Admitting: Internal Medicine

## 2022-12-24 DIAGNOSIS — R188 Other ascites: Secondary | ICD-10-CM | POA: Diagnosis present

## 2022-12-24 HISTORY — PX: IR PARACENTESIS: IMG2679

## 2022-12-24 LAB — BODY FLUID CELL COUNT WITH DIFFERENTIAL
Eos, Fluid: 0 %
Lymphs, Fluid: 44 %
Monocyte-Macrophage-Serous Fluid: 49 % — ABNORMAL LOW (ref 50–90)
Neutrophil Count, Fluid: 7 % (ref 0–25)
Total Nucleated Cell Count, Fluid: 268 uL (ref 0–1000)

## 2022-12-24 MED ORDER — LIDOCAINE HCL 1 % IJ SOLN
INTRAMUSCULAR | Status: AC
  Filled 2022-12-24: qty 20

## 2022-12-24 NOTE — Procedures (Signed)
PROCEDURE SUMMARY:  Successful US guided paracentesis from left lateral abdomen.  Yielded 5 liters of clear yellow fluid.  No immediate complications.  Patient tolerated well.  EBL = trace  Specimen was sent for labs.  Turkessa Ostrom S Rosaria Kubin PA-C 12/24/2022 9:04 AM

## 2022-12-25 ENCOUNTER — Encounter: Payer: Self-pay | Admitting: Physical Therapy

## 2022-12-25 ENCOUNTER — Ambulatory Visit (INDEPENDENT_AMBULATORY_CARE_PROVIDER_SITE_OTHER): Payer: Medicare Other | Admitting: Physical Therapy

## 2022-12-25 DIAGNOSIS — R293 Abnormal posture: Secondary | ICD-10-CM

## 2022-12-25 DIAGNOSIS — R2681 Unsteadiness on feet: Secondary | ICD-10-CM | POA: Diagnosis not present

## 2022-12-25 DIAGNOSIS — R2689 Other abnormalities of gait and mobility: Secondary | ICD-10-CM | POA: Diagnosis not present

## 2022-12-25 DIAGNOSIS — M6281 Muscle weakness (generalized): Secondary | ICD-10-CM

## 2022-12-25 DIAGNOSIS — Z9181 History of falling: Secondary | ICD-10-CM

## 2022-12-25 NOTE — Therapy (Signed)
OUTPATIENT PHYSICAL THERAPY PROSTHETIC TREATMENT    Patient Name: [name] MRN: [id number] DOB:June 12, 1950, 72 y.o., male Today's Date: 12/25/2022  PCP: Adolph Pollack, FNP REFERRING PROVIDER: Aldean Baker, MD  END OF SESSION:  PT End of Session - 12/25/22 1425     Visit Number 11    Number of Visits 25    Date for PT Re-Evaluation 01/30/23    Authorization Type Medicare & BCBS supplement    Progress Note Due on Visit 20    PT Start Time 1345    PT Stop Time 1423    PT Time Calculation (min) 38 min    Equipment Utilized During Treatment Gait belt    Activity Tolerance Patient tolerated treatment well    Behavior During Therapy WFL for tasks assessed/performed                      Past Medical History:  Diagnosis Date   Acute osteomyelitis of metatarsal bone of left foot (HCC)    Ambulates with cane    straight cane   Anemia    Cervical myelopathy (HCC) 02/06/2018   Chronic kidney disease    dailysis M W F- home   Community acquired pneumonia of left lower lobe of lung 08/18/2021   Complication of anesthesia    Coronary artery disease    Dehiscence of amputation stump of left lower extremity (HCC)    Diabetes mellitus without complication (HCC)    type2   Diabetic foot ulcer (HCC)    Diabetic neuropathy (HCC) 02/06/2018   Dysrhythmia    Gait abnormality 08/24/2018   GERD (gastroesophageal reflux disease)     01/06/20- not current   Gout    History of blood transfusion    History of blood transfusion    History of kidney stones    passed stones   Hypercholesteremia    Hypertension    Hypothyroidism    Neuromuscular disorder (HCC)    neuropathy left leg and bilateral feet   Neuropathy    Partial nontraumatic amputation of foot, 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   PONV (postoperative nausea and vomiting)    Prostate cancer (HCC)    PVD (peripheral vascular disease) (HCC)    with amputations   Past Surgical History:  Procedure Laterality Date    A-FLUTTER ABLATION N/A 04/06/2020   Procedure: A-FLUTTER ABLATION;  Surgeon: Marinus Maw, MD;  Location: [facility];  Service: Cardiovascular;  Laterality: N/A;   AMPUTATION Left 12/25/2013   Procedure: AMPUTATION RAY LEFT 5TH RAY;  Surgeon: Nadara Mustard, MD;  Location: [facility] ORS;  Service: Orthopedics;  Laterality: Left;   AMPUTATION Right 12/15/2018   Procedure: AMPUTATION OF 4TH AND 5TH TOES RIGHT FOOT;  Surgeon: Nadara Mustard, MD;  Location: [facility] OR;  Service: Orthopedics;  Laterality: Right;   AMPUTATION Left 02/02/2021   Procedure: LEFT FOOT 4TH RAY AMPUTATION;  Surgeon: Nadara Mustard, MD;  Location: [facility] OR;  Service: Orthopedics;  Laterality: Left;   AMPUTATION Left 05/09/2021   Procedure: LEFT BELOW KNEE AMPUTATION;  Surgeon: Nadara Mustard, MD;  Location: [facility] OR;  Service: Orthopedics;  Laterality: Left;   AMPUTATION Right 07/26/2022   Procedure: RIGHT BELOW KNEE AMPUTATION;  Surgeon: Nadara Mustard, MD;  Location: [facility] OR;  Service: Orthopedics;  Laterality: Right;   APPLICATION OF WOUND VAC Right 12/15/2018   Procedure: APPLICATION OF WOUND VAC;  Surgeon: Nadara Mustard, MD;  Location: [facility] OR;  Service: Orthopedics;  Laterality: Right;   AV FISTULA PLACEMENT Left 03/25/2019   Procedure: LEFT ARM ARTERIOVENOUS (AV) FISTULA CREATION;  Surgeon: Nada Libman, MD;  Location: [facility] OR;  Service: Vascular;  Laterality: Left;   BACK SURGERY     BASCILIC VEIN TRANSPOSITION Left 05/20/2019   Procedure: SECOND STAGE LEFT BASCILIC VEIN TRANSPOSITION;  Surgeon: Nada Libman, MD;  Location: [facility] OR;  Service: Vascular;  Laterality: Left;   BIOPSY  06/22/2022   Procedure: BIOPSY;  Surgeon: Lemar Lofty., MD;  Location: [facility] ENDOSCOPY;  Service: Gastroenterology;;   CARDIAC CATHETERIZATION  02/17/2014   CHOLECYSTECTOMY     COLONOSCOPY  2011   in [place], Normal   COLONOSCOPY N/A 06/23/2022   Procedure: COLONOSCOPY;  Surgeon: Sherrilyn Rist, MD;  Location: [facility] ENDOSCOPY;  Service:  Gastroenterology;  Laterality: N/A;   CORONARY ARTERY BYPASS GRAFT  2008   CYSTOSCOPY N/A 06/29/2020   Procedure: CYSTOSCOPY WITH CLOT EVACUATION AND  FULGERATION;  Surgeon: Marcine Matar, MD;  Location: [facility] OR;  Service: Urology;  Laterality: N/A;   CYSTOSCOPY WITH FULGERATION Bilateral 06/17/2020   Procedure: CYSTOSCOPY,BILATERAL RETROGRADE, CLOT EVACUATION WITH FULGERATION OF THE BLADDER;  Surgeon: Jannifer Hick, MD;  Location: [facility] OR;  Service: Urology;  Laterality: Bilateral;   CYSTOSCOPY WITH FULGERATION N/A 06/28/2020   Procedure: CYSTOSCOPY WITH CLOT EVACUATION AND FULGERATION OF BLEEDERS;  Surgeon: Marcine Matar, MD;  Location: [facility] OR;  Service: Urology;  Laterality: N/A;  1 HR   ENTEROSCOPY N/A 06/22/2022   Procedure: ENTEROSCOPY;  Surgeon: Meridee Score Netty Starring., MD;  Location: [facility] ENDOSCOPY;  Service: Gastroenterology;  Laterality: N/A;   HEMOSTASIS CLIP PLACEMENT  06/22/2022   Procedure: HEMOSTASIS CLIP PLACEMENT;  Surgeon: Lemar Lofty., MD;  Location: [name] ENDOSCOPY;  Service: Gastroenterology;;   HOT HEMOSTASIS N/A 06/22/2022   Procedure: HOT HEMOSTASIS (ARGON PLASMA COAGULATION/BICAP);  Surgeon: Lemar Lofty., MD;  Location: [facility] ENDOSCOPY;  Service: Gastroenterology;  Laterality: N/A;   I & D EXTREMITY Right 12/15/2018   Procedure: DEBRIDEMENT RIGHT FOOT;  Surgeon: Nadara Mustard, MD;  Location: [facility] OR;  Service: Orthopedics;  Laterality: Right;   I & D EXTREMITY Right 08/27/2019   Procedure: PARTIAL CUBOID EXCISION RIGHT FOOT;  Surgeon: Nadara Mustard, MD;  Location: [facility] OR;  Service: Orthopedics;  Laterality: Right;   I & D EXTREMITY Right 01/07/2020   Procedure: RIGHT FOOT EXCISION INFECTED BONE;  Surgeon: Nadara Mustard, MD;  Location: [facility] OR;  Service: Orthopedics;  Laterality: Right;   I & D EXTREMITY Right 05/17/2022   Procedure: IRRIGATION AND DEBRIDEMENT RIGHT FOOT ABSCESS;  Surgeon: Nadara Mustard, MD;  Location: [facility] OR;  Service: Orthopedics;  Laterality:  Right;   IR PARACENTESIS  10/11/2022   IR PARACENTESIS  12/24/2022   NECK SURGERY     novemver 2019   POLYPECTOMY  06/22/2022   Procedure: POLYPECTOMY;  Surgeon: Meridee Score Netty Starring., MD;  Location: [facility] ENDOSCOPY;  Service: Gastroenterology;;   POLYPECTOMY  06/23/2022   Procedure: POLYPECTOMY;  Surgeon: Sherrilyn Rist, MD;  Location: [facility] ENDOSCOPY;  Service: Gastroenterology;;   STUMP REVISION Left 06/27/2021   Procedure: REVISION LEFT BELOW KNEE AMPUTATION;  Surgeon: Nadara Mustard, MD;  Location: [facility] OR;  Service: Orthopedics;  Laterality: Left;   SUBMUCOSAL LIFTING INJECTION  06/22/2022   Procedure: SUBMUCOSAL LIFTING INJECTION;  Surgeon: Meridee Score Netty Starring., MD;  Location: [facility] ENDOSCOPY;  Service: Gastroenterology;;   SUBMUCOSAL TATTOO INJECTION  06/22/2022   Procedure: SUBMUCOSAL TATTOO INJECTION;  Surgeon: Meridee Score,  Netty Starring., MD;  Location: [facility] ENDOSCOPY;  Service: Gastroenterology;;   TRANSURETHRAL RESECTION OF PROSTATE N/A 06/28/2020   Procedure: TRANSURETHRAL RESECTION OF THE PROSTATE (TURP);  Surgeon: Marcine Matar, MD;  Location: [facility] OR;  Service: Urology;  Laterality: N/A;   WISDOM TOOTH EXTRACTION     Patient Active Problem List   Diagnosis Date Noted   Right below-knee amputee (HCC) 07/29/2022   Severe sepsis (HCC) 07/23/2022   Foot infection 07/22/2022   Acute respiratory failure (HCC) 07/22/2022   Anemia due to chronic blood loss 06/23/2022   Heme positive stool 06/23/2022   Benign neoplasm of cecum 06/23/2022   Benign neoplasm of transverse colon 06/23/2022   Benign neoplasm of descending colon 06/23/2022   Abscess of right foot 05/17/2022   Contraindication to anticoagulation therapy GU Bleed 03/13/2022   Paroxysmal atrial fibrillation (HCC) 08/18/2021   Anemia of chronic renal failure 08/18/2021   Thrombocytopenia (HCC) 08/18/2021   Acute metabolic encephalopathy 08/18/2021   Action induced myoclonus 08/18/2021   Left below-knee amputee (HCC) 05/11/2021    Symptomatic anemia 09/13/2020   COVID-19 virus infection 09/13/2020   Pressure injury of skin 06/29/2020   Gross hematuria 06/16/2020   Typical atrial flutter (HCC) 03/24/2020   Bilateral pleural effusion 02/24/2020   Healthcare maintenance 01/28/2020   Shortness of breath 01/28/2020   History of partial ray amputation of fourth toe of right foot (HCC) 09/08/2019   Chronic cough 06/25/2019   Cutaneous abscess of right foot    Subacute osteomyelitis, right ankle and foot (HCC) 12/14/2018   AKI (acute kidney injury) (HCC) 12/14/2018   ESRD on dialysis (HCC) 12/14/2018   S/P CABG (coronary artery bypass graft) 12/11/2018   Gait abnormality 08/24/2018   Diabetic neuropathy (HCC) 02/06/2018   Cervical myelopathy (HCC) 02/06/2018   Onychomycosis 10/30/2017   Other spondylosis with radiculopathy, cervical region 01/27/2017   Midfoot ulcer, right, limited to breakdown of skin (HCC) 11/15/2016   Lateral epicondylitis, left elbow 08/12/2016   Prostate cancer (HCC) 06/19/2016   Cellulitis of right foot 12/24/2015   Gout 09/14/2012   Hypertension associated with diabetes (HCC) 09/14/2012   Hypothyroidism 09/14/2012   CAD (coronary artery disease) 09/14/2012   Insulin dependent type 2 diabetes mellitus (HCC) 09/14/2012   Hyperlipidemia associated with type 2 diabetes mellitus (HCC)    Neuropathy (HCC)     ONSET DATE: 10/31/2022 right BKA prosthesis delivery  REFERRING DIAG:  N62.952W (ICD-10-CM) - Below-knee amputation of left lower extremity, initial encounter (HCC)  U13.244W (ICD-10-CM) - Below-knee amputation of right lower extremity, initial encounter (HCC)   THERAPY DIAG:  Unsteadiness on feet  Other abnormalities of gait and mobility  Muscle weakness (generalized)  Abnormal posture  History of falling  Rationale for Evaluation and Treatment: Rehabilitation  SUBJECTIVE:   SUBJECTIVE STATEMENT: He states not having knee pain, continues to endorse some shoulder pain  after prolonged walking. He will have cataract surgery on Monday.    PERTINENT HISTORY: ESRD on home dialysis, HTN, HLD, CAD, DM2, PVD, prostate CA, cervical myelopathy,    PAIN:  Are you having pain? No  PRECAUTIONS: Fall and Other: No BP LUE  WEIGHT BEARING RESTRICTIONS: No  FALLS: Has patient fallen in last 6 months? Yes. Number of falls 1 his left prosthesis slipped off & denies injuries.   LIVING ENVIRONMENT: Lives with: lives with their spouse and CNA Mon-Fri 1:00-5:00 Lives in: House  Home Access: Ramped entrance Home layout:  3 level enters on middle floor which has bedroom & full bath.  Stairs: downstairs has chair lift.  Has following equipment at home: Single point cane, Walker - 2 wheeled, Environmental consultant - 4 wheeled, Wheelchair (manual), [employer], Grab bars, and Ramped entry  OCCUPATION: retired  PLOF: he was walking with RW with left prosthesis.    PATIENT GOALS:   to use 2 prostheses to walk with cane. He wants to bring garbage cans up sloped driveway.    OBJECTIVE:  COGNITION: Eval on 11/06/2022:  Overall cognitive status: Within functional limits for tasks assessed  POSTURE: Eval on 11/06/2022: rounded shoulders, forward head, flexed trunk , and weight shift left  LOWER EXTREMITY ROM:  ROM P:passive  A:active Right eval  Hip flexion   Hip extension   Hip abduction   Hip adduction   Hip internal rotation   Hip external rotation   Knee flexion   Knee extension seated P: -6*  Ankle dorsiflexion   Ankle plantarflexion   Ankle inversion   Ankle eversion    (Blank rows = not tested)  LOWER EXTREMITY MMT:  MMT Right eval Left eval Rt/Lt 12/23/22  Hip flexion 4/5 4/5 4+/4+  Hip extension 3/5 3+5 4/4  Hip abduction 35 3+5 4/4  Hip adduction     Hip internal rotation     Hip external rotation     Knee flexion 3+/5 4/5 4/4  Knee extension 3+/5 4/5 4+/4+  Eval MMT gross seated & functional  (Blank rows = not tested)  TRANSFERS: Eval on  11/06/2022: Sit to stand: SBA 22" w/c using BUEs on armrests requires RW for stabilization Stand to sit: SBA RW for stabilization to 22" w/c using BUEs on armrests  GAIT: 12/16/2022: Pt amb 101' (max tolerable distance) with rollator walker with supervision / verbal cues on posture including position within walker.    Eval on 11/06/2022: Gait pattern: step to pattern, decreased step length- Left, decreased stance time- Right, Right hip hike, Left hip hike, knee flexed in stance- Right, lateral hip instability, trunk flexed, and wide BOS Distance walked: 25' turning 180* CW & CCW Assistive device utilized: Environmental consultant - 2 wheeled and bilateral TTA prostheses Level of assistance: Min A  FUNCTIONAL TESTs:  Eval on 11/06/2022: Standing balance with RW support with close supervision: reaches 5" anteriorly, looks to side only when looking over shoulders and reaches towards floor within 10".    CURRENT PROSTHETIC WEAR ASSESSMENT: 11/06/2022:  Patient is independent with: prosthetic cleaning and ply sock cleaning Patient is dependent with: skin check, residual limb care, proper wear schedule/adjustment, and proper weight-bearing schedule/adjustment Donning prosthesis: SBA / verbal cues Doffing prosthesis: Modified independence Prosthetic wear tolerance: pt reports wearing left prosthesis daily all awake hours for several months.  Right prosthesis has worn daily for 6 days since receiving it for most of awake hours.  Prosthetic weight bearing tolerance: 5 minutes with no c/o pain Edema: right limb has pitting edema with 5 sec capillary refill.  Left limb has no edema noted. Residual limb condition: Left limb has superficial wound 5mm with white perimeter from excessive fluid & CNA reports some drainage;  Right limb has no open areas, distal area has redness & shiny skin, normal temperature, no hair growth.  Prosthetic description: silicon liners (9mm over tibia & 6mm posterior portion) with pin lock  suspension, socket with ratchet suspension, flexible Keel feet     TODAY'S TREATMENT:  DATE:  12/25/2022: Prosthetic Training with Bilateral Transtibial Prostheses:  Pt amb 150' max tolerated distance with rollator and CGA Pt amb 75 feet with rollator X 2 with CGA In // bars with bilat UE support performing step ups onto 6 inch step leading with Rt leg 3 reps, then leading with left leg 3 reps, then needed extended rest break and performed another 3 reps on each side. CGA to min A  Stand to sit and sit to stand with rollator and min A for balance/unsteadiness overalll  12/23/2022: Prosthetic Training with Bilateral Transtibial Prostheses:  PT reviewed with Pt wound care instructions and he verbalized understanding.  Pt amb 150' max tolerated distance with rollator.  Updated measurements and goals for progress note   Therapeutic Exercise: Hamstring & gastroc stretches 30 sec hold 3 reps BLEs. controlled trunk flexion to actively work back extensors placing hands on locked rollator seat then back to upright. 5 reps leading up & down with RUE & 5 reps with LUE.    Therapeutic activity: Pt ambulated up/down 5 stairs in clinic hallway with bilat UE support, min to mod A overall. He needed seated rest break at landing after the 5 steps, then had +2 assistance for safety to descend back down.  12/18/2022: Prosthetic Training with Bilateral Transtibial Prostheses:  PT reviewed with pt & wife use of Tegaderm over wounds with prostheses and Vivewear at night.  Sitting with feet abducted to minimize external rotation putting socket pressure on medial knee. Pt & wife verbalized understanding.  PT demo & verbal cues on use of nylon under liner proximal to knee to decrease itching from sweat & friction rub. Pt & wife verbalized understanding. Pt amb 105' with rollator walker with  cues on upright posture & placement close to rollator with supervision.    Therapeutic Exercise: Hamstring & gastroc stretches 30 sec hold 3 reps BLEs. controlled trunk flexion to actively work back extensors placing hands on locked rollator seat then back to upright. 5 reps leading up & down with RUE & 5 reps with LUE.  PT educated pt &  wife with demo & verbal cues. Pt return demo & wife verbalized understanding.     12/16/2022: Prosthetic Training with Transtibial Prostheses: New wound superficial on medial femoral condyle 8mm diameter and 2nd new scratch near medial gastroc head (appears from scratching limb with fingernails). 2 previous wounds still present wound 7mm over patella and 2nd wound posterior gastroc belly area 3mm. No signs of infection.  Only wound over patella covered with Tegaderm. PT covered remaining wounds. PT educated on sitting posture with feet wide so he does not ER which prosthesis puts pressure on medial femoral condyle where he got new wound. Pt & CNA verbalized understanding.  Pt amb 101' (max tolerable distance) with rollator walker with supervision / verbal cues on posture including position within walker.    Therapeutic Exercise: Hamstring & gastroc stretches. See HEP below. PT educated with demo & verbal with HO. PT hand written directions for controlled trunk flexion to actively work back extensors placing hands on locked rollator seat then back to upright. 5 reps leading up & down with RUE & 5 reps with LUE.  Pt verbalized and return demo understanding of  3 exercises.  Step up & step down on 4" block with locked rollator walker 1 rep ea leading RLE & leading LLE with minA.    12/11/2022: Prosthetic Training with Transtibial Prostheses: Right residual limb with wound 7mm over patella and 2nd  wound posterior gastroc belly area 3mm. Other wounds have healed. No signs of infection.  Pt amb 80', 20' & 72' with rollator walker with cues for upright posture  looking forward / not staring at floor and staying close to rollator walker.  Therapeutic Exercise: Attempted recumbent bike as gym has one, but unable to adjust seat so feet would stay on pedal or allow knee flexion.  Nustep seat 10 level 5 with BLEs & BUEs 8 min with cues to hold head up / upper body posture.  Standing with single UE support on locked stabilized rollator walker: green theraband 5 reps 2 sets ea exercise BUEs for row, forward reach & bicep curls.  Sitting on 24" bar stool with feet on floor: upright trunk without UE assist for 2 min with verbal & tactile cues to correct posture.    HOME EXERCISE PROGRAM: Access Code: [email or web address] URL: [email or web address] Date: 12/16/2022 Prepared by: Vladimir Faster  Exercises - Seated Hamstring Stretch with Strap  - 1 x daily - 7 x weekly - 1 sets - 2-3 reps - 30 seconds hold - standing calf stretch with forefoot on small step or brick  - 1 x daily - 7 x weekly - 1 sets - 2-3 reps - 30 seconds hold   Increasing your activity level is important.  Short distances which is walking from one room to another. Work to increase frequency back to prior level. Start with walk each hour during awake hours, then every 45-50 min, decreasing until he is moving some every 30 min during day   Medium distances are entering & exiting your home or community with limited distances. Start with 4 medium walks which is one outing to one location and increase number of tolerated amounts per day.  Long distance is your highest tolerance for you. Walk until you feel you must rest. Back or leg pain or general fatigue are indicators to maximum tolerance. Monitor by distance or time. Try to walk your BEST distance 1-2 times per day. You should see this increase over time.      ASSESSMENT:  CLINICAL IMPRESSION: We worked today on standing and walking tolerance as well as step ups to develop strength for stairs. He was fatigued by end of session. He  will have cataract surgery Monday so he was advised to ask eye doctor about any restrictions or how much time he should take off of PT after this procedure.   OBJECTIVE IMPAIRMENTS: Abnormal gait, decreased activity tolerance, decreased balance, decreased endurance, decreased knowledge of use of DME, decreased mobility, difficulty walking, decreased ROM, decreased strength, increased edema, postural dysfunction, and prosthetic dependency .   ACTIVITY LIMITATIONS: carrying, standing, stairs, transfers, locomotion level, and standing ADLs  PARTICIPATION LIMITATIONS: community activity and household mobility  PERSONAL FACTORS: Age, Fitness, Past/current experiences, Time since onset of injury/illness/exacerbation, and 3+ comorbidities: see PMH  are also affecting patient's functional outcome.   REHAB POTENTIAL: Good  CLINICAL DECISION MAKING: Evolving/moderate complexity  EVALUATION COMPLEXITY: Moderate   GOALS: Goals reviewed with patient? Yes  SHORT TERM GOALS: Target date: 01/02/2023  Patient verbalizes proper skin care & checking limbs.  Baseline: SEE OBJECTIVE DATA Goal status: MET 12/23/22 2.  Patient tolerates prostheses >12 hrs total /day without skin issues or limb pain Baseline: SEE OBJECTIVE DATA Goal status: MET 12/23/22, he reports he wears them all day long  3.  Patient able to reach 10" and look over both shoulders with single UE support on RW safely. Baseline: SEE OBJECTIVE DATA  Goal status: MET 12/23/22, was able to reach 11 inches  4. Patient ambulates 200' with RW & prostheses with supervision. Baseline: SEE OBJECTIVE DATA Goal status: Ongoing 12/23/2022, 150 feet was max tolerated distance today  5. Patient negotiates ramps & curbs with RW & prostheses with minA. Baseline: SEE OBJECTIVE DATA Goal status: MET 12/23/22  LONG TERM GOALS: Target date: 01/30/2023  Patient demonstrates & verbalized understanding of prosthetic care to enable safe utilization of  prosthesis. Baseline: SEE OBJECTIVE DATA Goal status:   Ongoing 12/11/2022  Patient tolerates prostheses wear >90% of awake hours without skin or limb pain issues. Baseline: SEE OBJECTIVE DATA Goal status: Ongoing 12/11/2022  Tasks of [employer] with RW support >/= 30/56 Baseline: SEE OBJECTIVE DATA Goal status: Ongoing 12/11/2022  Patient ambulates >300' with prosthesis & LRAD independently Baseline: SEE OBJECTIVE DATA Goal status: Ongoing 12/11/2022  Patient negotiates ramps, curbs & stairs with single rail with prostheses & LRAD modified independent. Baseline: SEE OBJECTIVE DATA Goal status: Ongoing 12/11/2022  Patient verbalizes & demonstrates understanding of how to properly use fitness equipment to return to gym. Baseline: SEE OBJECTIVE DATA Goal status:  Ongoing 12/11/2022  PLAN:  PT FREQUENCY: 2x/week  PT DURATION: 12 weeks  PLANNED INTERVENTIONS: Therapeutic exercises, Therapeutic activity, Neuromuscular re-education, Balance training, Gait training, Patient/Family education, Self Care, Stair training, Prosthetic training, DME instructions, Aquatic Therapy, Re-evaluation, and physical performance testing  PLAN FOR NEXT SESSION:    continue to Work towards STGs,  continue prosthetic gait with rollator walker. Balance activities and functional exercises to promote an upright trunk. Strengthening especially core & LEs    April Manson, PT, DPT 12/25/2022, 2:26 PM

## 2022-12-30 ENCOUNTER — Encounter: Payer: Medicare Other | Admitting: Physical Medicine and Rehabilitation

## 2022-12-30 ENCOUNTER — Ambulatory Visit: Payer: Medicare Other | Admitting: Orthopedic Surgery

## 2023-01-02 ENCOUNTER — Ambulatory Visit (INDEPENDENT_AMBULATORY_CARE_PROVIDER_SITE_OTHER): Payer: Medicare Other | Admitting: Physical Therapy

## 2023-01-02 ENCOUNTER — Encounter: Payer: Self-pay | Admitting: Physical Therapy

## 2023-01-02 DIAGNOSIS — M6281 Muscle weakness (generalized): Secondary | ICD-10-CM | POA: Diagnosis not present

## 2023-01-02 DIAGNOSIS — R2681 Unsteadiness on feet: Secondary | ICD-10-CM

## 2023-01-02 DIAGNOSIS — Z9181 History of falling: Secondary | ICD-10-CM

## 2023-01-02 DIAGNOSIS — R293 Abnormal posture: Secondary | ICD-10-CM

## 2023-01-02 DIAGNOSIS — R2689 Other abnormalities of gait and mobility: Secondary | ICD-10-CM | POA: Diagnosis not present

## 2023-01-02 DIAGNOSIS — Z7409 Other reduced mobility: Secondary | ICD-10-CM

## 2023-01-02 NOTE — Therapy (Signed)
OUTPATIENT PHYSICAL THERAPY PROSTHETIC TREATMENT    Patient Name: [name] MRN: [id number] DOB:April 21, 1950, 72 y.o., male Today's Date: 01/02/2023  PCP: Adolph Pollack, FNP REFERRING PROVIDER: Aldean Baker, MD  END OF SESSION:  PT End of Session - 01/02/23 1113     Visit Number 12    Number of Visits 25    Date for PT Re-Evaluation 01/30/23    Authorization Type Medicare & BCBS supplement    Progress Note Due on Visit 20    PT Start Time 1102    PT Stop Time 1132    PT Time Calculation (min) 30 min    Equipment Utilized During Treatment Gait belt    Activity Tolerance Patient tolerated treatment well    Behavior During Therapy WFL for tasks assessed/performed                      Past Medical History:  Diagnosis Date   Acute osteomyelitis of metatarsal bone of left foot (HCC)    Ambulates with cane    straight cane   Anemia    Cervical myelopathy (HCC) 02/06/2018   Chronic kidney disease    dailysis M W F- home   Community acquired pneumonia of left lower lobe of lung 08/18/2021   Complication of anesthesia    Coronary artery disease    Dehiscence of amputation stump of left lower extremity (HCC)    Diabetes mellitus without complication (HCC)    type2   Diabetic foot ulcer (HCC)    Diabetic neuropathy (HCC) 02/06/2018   Dysrhythmia    Gait abnormality 08/24/2018   GERD (gastroesophageal reflux disease)     01/06/20- not current   Gout    History of blood transfusion    History of blood transfusion    History of kidney stones    passed stones   Hypercholesteremia    Hypertension    Hypothyroidism    Neuromuscular disorder (HCC)    neuropathy left leg and bilateral feet   Neuropathy    Partial nontraumatic amputation of foot, 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   PONV (postoperative nausea and vomiting)    Prostate cancer (HCC)    PVD (peripheral vascular disease) (HCC)    with amputations   Past Surgical History:  Procedure Laterality Date    A-FLUTTER ABLATION N/A 04/06/2020   Procedure: A-FLUTTER ABLATION;  Surgeon: Marinus Maw, MD;  Location: [facility] INV[facility];  Service: Cardiovascular;  Laterality: N/A;   AMPUTATION Left 12/25/2013   Procedure: AMPUTATION RAY LEFT 5TH RAY;  Surgeon: Nadara Mustard, MD;  Location: [facility] ORS;  Service: Orthopedics;  Laterality: Left;   AMPUTATION Right 12/15/2018   Procedure: AMPUTATION OF 4TH AND 5TH TOES RIGHT FOOT;  Surgeon: Nadara Mustard, MD;  Location: [facility] OR;  Service: Orthopedics;  Laterality: Right;   AMPUTATION Left 02/02/2021   Procedure: LEFT FOOT 4TH RAY AMPUTATION;  Surgeon: Nadara Mustard, MD;  Location: [facility] OR;  Service: Orthopedics;  Laterality: Left;   AMPUTATION Left 05/09/2021   Procedure: LEFT BELOW KNEE AMPUTATION;  Surgeon: Nadara Mustard, MD;  Location: [facility] OR;  Service: Orthopedics;  Laterality: Left;   AMPUTATION Right 07/26/2022   Procedure: RIGHT BELOW KNEE AMPUTATION;  Surgeon: Nadara Mustard, MD;  Location: [facility] OR;  Service: Orthopedics;  Laterality: Right;   APPLICATION OF WOUND VAC Right 12/15/2018   Procedure: APPLICATION OF WOUND VAC;  Surgeon: Nadara Mustard, MD;  Location: [facility] OR;  Service: Orthopedics;  Laterality: Right;   AV FISTULA PLACEMENT Left 03/25/2019   Procedure: LEFT ARM ARTERIOVENOUS (AV) FISTULA CREATION;  Surgeon: Nada Libman, MD;  Location: [facility] OR;  Service: Vascular;  Laterality: Left;   BACK SURGERY     BASCILIC VEIN TRANSPOSITION Left 05/20/2019   Procedure: SECOND STAGE LEFT BASCILIC VEIN TRANSPOSITION;  Surgeon: Nada Libman, MD;  Location: [facility] OR;  Service: Vascular;  Laterality: Left;   BIOPSY  06/22/2022   Procedure: BIOPSY;  Surgeon: Lemar Lofty., MD;  Location: [facility] ENDOSCOPY;  Service: Gastroenterology;;   CARDIAC CATHETERIZATION  02/17/2014   CHOLECYSTECTOMY     COLONOSCOPY  2011   in [place], Normal   COLONOSCOPY N/A 06/23/2022   Procedure: COLONOSCOPY;  Surgeon: Sherrilyn Rist, MD;  Location: [facility] ENDOSCOPY;  Service:  Gastroenterology;  Laterality: N/A;   CORONARY ARTERY BYPASS GRAFT  2008   CYSTOSCOPY N/A 06/29/2020   Procedure: CYSTOSCOPY WITH CLOT EVACUATION AND  FULGERATION;  Surgeon: Marcine Matar, MD;  Location: [facility] OR;  Service: Urology;  Laterality: N/A;   CYSTOSCOPY WITH FULGERATION Bilateral 06/17/2020   Procedure: CYSTOSCOPY,BILATERAL RETROGRADE, CLOT EVACUATION WITH FULGERATION OF THE BLADDER;  Surgeon: Jannifer Hick, MD;  Location: [facility] OR;  Service: Urology;  Laterality: Bilateral;   CYSTOSCOPY WITH FULGERATION N/A 06/28/2020   Procedure: CYSTOSCOPY WITH CLOT EVACUATION AND FULGERATION OF BLEEDERS;  Surgeon: Marcine Matar, MD;  Location: [facility] OR;  Service: Urology;  Laterality: N/A;  1 HR   ENTEROSCOPY N/A 06/22/2022   Procedure: ENTEROSCOPY;  Surgeon: Meridee Score Netty Starring., MD;  Location: [facility] ENDOSCOPY;  Service: Gastroenterology;  Laterality: N/A;   HEMOSTASIS CLIP PLACEMENT  06/22/2022   Procedure: HEMOSTASIS CLIP PLACEMENT;  Surgeon: Lemar Lofty., MD;  Location: [name] ENDOSCOPY;  Service: Gastroenterology;;   HOT HEMOSTASIS N/A 06/22/2022   Procedure: HOT HEMOSTASIS (ARGON PLASMA COAGULATION/BICAP);  Surgeon: Lemar Lofty., MD;  Location: [facility] ENDOSCOPY;  Service: Gastroenterology;  Laterality: N/A;   I & D EXTREMITY Right 12/15/2018   Procedure: DEBRIDEMENT RIGHT FOOT;  Surgeon: Nadara Mustard, MD;  Location: [facility] OR;  Service: Orthopedics;  Laterality: Right;   I & D EXTREMITY Right 08/27/2019   Procedure: PARTIAL CUBOID EXCISION RIGHT FOOT;  Surgeon: Nadara Mustard, MD;  Location: [place] OR;  Service: Orthopedics;  Laterality: Right;   I & D EXTREMITY Right 01/07/2020   Procedure: RIGHT FOOT EXCISION INFECTED BONE;  Surgeon: Nadara Mustard, MD;  Location: [facility] OR;  Service: Orthopedics;  Laterality: Right;   I & D EXTREMITY Right 05/17/2022   Procedure: IRRIGATION AND DEBRIDEMENT RIGHT FOOT ABSCESS;  Surgeon: Nadara Mustard, MD;  Location: [facility] OR;  Service: Orthopedics;  Laterality:  Right;   IR PARACENTESIS  10/11/2022   IR PARACENTESIS  12/24/2022   NECK SURGERY     novemver 2019   POLYPECTOMY  06/22/2022   Procedure: POLYPECTOMY;  Surgeon: Meridee Score Netty Starring., MD;  Location: [facility] ENDOSCOPY;  Service: Gastroenterology;;   POLYPECTOMY  06/23/2022   Procedure: POLYPECTOMY;  Surgeon: Sherrilyn Rist, MD;  Location: [facility] ENDOSCOPY;  Service: Gastroenterology;;   STUMP REVISION Left 06/27/2021   Procedure: REVISION LEFT BELOW KNEE AMPUTATION;  Surgeon: Nadara Mustard, MD;  Location: [facility] OR;  Service: Orthopedics;  Laterality: Left;   SUBMUCOSAL LIFTING INJECTION  06/22/2022   Procedure: SUBMUCOSAL LIFTING INJECTION;  Surgeon: Meridee Score Netty Starring., MD;  Location: [facility] ENDOSCOPY;  Service: Gastroenterology;;   SUBMUCOSAL TATTOO INJECTION  06/22/2022   Procedure: SUBMUCOSAL TATTOO INJECTION;  Surgeon: Meridee Score,  Netty Starring., MD;  Location: [facility] ENDOSCOPY;  Service: Gastroenterology;;   TRANSURETHRAL RESECTION OF PROSTATE N/A 06/28/2020   Procedure: TRANSURETHRAL RESECTION OF THE PROSTATE (TURP);  Surgeon: Marcine Matar, MD;  Location: [facility] OR;  Service: Urology;  Laterality: N/A;   WISDOM TOOTH EXTRACTION     Patient Active Problem List   Diagnosis Date Noted   Right below-knee amputee (HCC) 07/29/2022   Severe sepsis (HCC) 07/23/2022   Foot infection 07/22/2022   Acute respiratory failure (HCC) 07/22/2022   Anemia due to chronic blood loss 06/23/2022   Heme positive stool 06/23/2022   Benign neoplasm of cecum 06/23/2022   Benign neoplasm of transverse colon 06/23/2022   Benign neoplasm of descending colon 06/23/2022   Abscess of right foot 05/17/2022   Contraindication to anticoagulation therapy GU Bleed 03/13/2022   Paroxysmal atrial fibrillation (HCC) 08/18/2021   Anemia of chronic renal failure 08/18/2021   Thrombocytopenia (HCC) 08/18/2021   Acute metabolic encephalopathy 08/18/2021   Action induced myoclonus 08/18/2021   Left below-knee amputee (HCC) 05/11/2021    Symptomatic anemia 09/13/2020   COVID-19 virus infection 09/13/2020   Pressure injury of skin 06/29/2020   Gross hematuria 06/16/2020   Typical atrial flutter (HCC) 03/24/2020   Bilateral pleural effusion 02/24/2020   Healthcare maintenance 01/28/2020   Shortness of breath 01/28/2020   History of partial ray amputation of fourth toe of right foot (HCC) 09/08/2019   Chronic cough 06/25/2019   Cutaneous abscess of right foot    Subacute osteomyelitis, right ankle and foot (HCC) 12/14/2018   AKI (acute kidney injury) (HCC) 12/14/2018   ESRD on dialysis (HCC) 12/14/2018   S/P CABG (coronary artery bypass graft) 12/11/2018   Gait abnormality 08/24/2018   Diabetic neuropathy (HCC) 02/06/2018   Cervical myelopathy (HCC) 02/06/2018   Onychomycosis 10/30/2017   Other spondylosis with radiculopathy, cervical region 01/27/2017   Midfoot ulcer, right, limited to breakdown of skin (HCC) 11/15/2016   Lateral epicondylitis, left elbow 08/12/2016   Prostate cancer (HCC) 06/19/2016   Cellulitis of right foot 12/24/2015   Gout 09/14/2012   Hypertension associated with diabetes (HCC) 09/14/2012   Hypothyroidism 09/14/2012   CAD (coronary artery disease) 09/14/2012   Insulin dependent type 2 diabetes mellitus (HCC) 09/14/2012   Hyperlipidemia associated with type 2 diabetes mellitus (HCC)    Neuropathy (HCC)     ONSET DATE: 10/31/2022 right BKA prosthesis delivery  REFERRING DIAG:  R60.454U (ICD-10-CM) - Below-knee amputation of left lower extremity, initial encounter (HCC)  J81.191Y (ICD-10-CM) - Below-knee amputation of right lower extremity, initial encounter (HCC)   THERAPY DIAG:  Unsteadiness on feet  Other abnormalities of gait and mobility  Muscle weakness (generalized)  Abnormal posture  History of falling  Impaired functional mobility, balance, gait, and endurance  Rationale for Evaluation and Treatment: Rehabilitation  SUBJECTIVE:   SUBJECTIVE STATEMENT: He denies any  issues with prosthesis but does report his has a sacral pressure sore and he will go have this checked out today after leaves PT because it is bleeding  PERTINENT HISTORY: ESRD on home dialysis, HTN, HLD, CAD, DM2, PVD, prostate CA, cervical myelopathy,    PAIN:  Are you having pain? No  PRECAUTIONS: Fall and Other: No BP LUE  WEIGHT BEARING RESTRICTIONS: No  FALLS: Has patient fallen in last 6 months? Yes. Number of falls 1 his left prosthesis slipped off & denies injuries.   LIVING ENVIRONMENT: Lives with: lives with their spouse and CNA Mon-Fri 1:00-5:00 Lives in: House  Home Access: Ramped entrance  Home layout:  3 level enters on middle floor which has bedroom & full bath. Stairs: downstairs has chair lift.  Has following equipment at home: Single point cane, Walker - 2 wheeled, Environmental consultant - 4 wheeled, Wheelchair (manual), [employer], Grab bars, and Ramped entry  OCCUPATION: retired  PLOF: he was walking with RW with left prosthesis.    PATIENT GOALS:   to use 2 prostheses to walk with cane. He wants to bring garbage cans up sloped driveway.    OBJECTIVE:  COGNITION: Eval on 11/06/2022:  Overall cognitive status: Within functional limits for tasks assessed  POSTURE: Eval on 11/06/2022: rounded shoulders, forward head, flexed trunk , and weight shift left  LOWER EXTREMITY ROM:  ROM P:passive  A:active Right eval  Hip flexion   Hip extension   Hip abduction   Hip adduction   Hip internal rotation   Hip external rotation   Knee flexion   Knee extension seated P: -6*  Ankle dorsiflexion   Ankle plantarflexion   Ankle inversion   Ankle eversion    (Blank rows = not tested)  LOWER EXTREMITY MMT:  MMT Right eval Left eval Rt/Lt 12/23/22  Hip flexion 4/5 4/5 4+/4+  Hip extension 3/5 3+5 4/4  Hip abduction 35 3+5 4/4  Hip adduction     Hip internal rotation     Hip external rotation     Knee flexion 3+/5 4/5 4/4  Knee extension 3+/5 4/5 4+/4+  Eval MMT gross  seated & functional  (Blank rows = not tested)  TRANSFERS: Eval on 11/06/2022: Sit to stand: SBA 22" w/c using BUEs on armrests requires RW for stabilization Stand to sit: SBA RW for stabilization to 22" w/c using BUEs on armrests  GAIT: 12/16/2022: Pt amb 101' (max tolerable distance) with rollator walker with supervision / verbal cues on posture including position within walker.    Eval on 11/06/2022: Gait pattern: step to pattern, decreased step length- Left, decreased stance time- Right, Right hip hike, Left hip hike, knee flexed in stance- Right, lateral hip instability, trunk flexed, and wide BOS Distance walked: 25' turning 180* CW & CCW Assistive device utilized: Environmental consultant - 2 wheeled and bilateral TTA prostheses Level of assistance: Min A  FUNCTIONAL TESTs:  Eval on 11/06/2022: Standing balance with RW support with close supervision: reaches 5" anteriorly, looks to side only when looking over shoulders and reaches towards floor within 10".    CURRENT PROSTHETIC WEAR ASSESSMENT: 11/06/2022:  Patient is independent with: prosthetic cleaning and ply sock cleaning Patient is dependent with: skin check, residual limb care, proper wear schedule/adjustment, and proper weight-bearing schedule/adjustment Donning prosthesis: SBA / verbal cues Doffing prosthesis: Modified independence Prosthetic wear tolerance: pt reports wearing left prosthesis daily all awake hours for several months.  Right prosthesis has worn daily for 6 days since receiving it for most of awake hours.  Prosthetic weight bearing tolerance: 5 minutes with no c/o pain Edema: right limb has pitting edema with 5 sec capillary refill.  Left limb has no edema noted. Residual limb condition: Left limb has superficial wound 5mm with white perimeter from excessive fluid & CNA reports some drainage;  Right limb has no open areas, distal area has redness & shiny skin, normal temperature, no hair growth.  Prosthetic description:  silicon liners (9mm over tibia & 6mm posterior portion) with pin lock suspension, socket with ratchet suspension, flexible Keel feet     TODAY'S TREATMENT:  DATE:  01/02/2023: Prosthetic Training with Bilateral Transtibial Prostheses:  Pt amb 30' X 2 with rollator and CGA Pt amb 75 feet X 2  with rollator X 2 with CGA Pt navigated 6.5 inch step with rollator and min A, cues and demo for techinque   12/25/2022: Prosthetic Training with Bilateral Transtibial Prostheses:  Pt amb 150' max tolerated distance with rollator and CGA Pt amb 75 feet with rollator X 2 with CGA In // bars with bilat UE support performing step ups onto 6 inch step leading with Rt leg 3 reps, then leading with left leg 3 reps, then needed extended rest break and performed another 3 reps on each side. CGA to min A  Stand to sit and sit to stand with rollator and min A for balance/unsteadiness overalll  12/23/2022: Prosthetic Training with Bilateral Transtibial Prostheses:  PT reviewed with Pt wound care instructions and he verbalized understanding.  Pt amb 150' max tolerated distance with rollator.  Updated measurements and goals for progress note   Therapeutic Exercise: Hamstring & gastroc stretches 30 sec hold 3 reps BLEs. controlled trunk flexion to actively work back extensors placing hands on locked rollator seat then back to upright. 5 reps leading up & down with RUE & 5 reps with LUE.    Therapeutic activity: Pt ambulated up/down 5 stairs in clinic hallway with bilat UE support, min to mod A overall. He needed seated rest break at landing after the 5 steps, then had +2 assistance for safety to descend back down.  12/18/2022: Prosthetic Training with Bilateral Transtibial Prostheses:  PT reviewed with pt & wife use of Tegaderm over wounds with prostheses and Vivewear at night.  Sitting with  feet abducted to minimize external rotation putting socket pressure on medial knee. Pt & wife verbalized understanding.  PT demo & verbal cues on use of nylon under liner proximal to knee to decrease itching from sweat & friction rub. Pt & wife verbalized understanding. Pt amb 105' with rollator walker with cues on upright posture & placement close to rollator with supervision.    Therapeutic Exercise: Hamstring & gastroc stretches 30 sec hold 3 reps BLEs. controlled trunk flexion to actively work back extensors placing hands on locked rollator seat then back to upright. 5 reps leading up & down with RUE & 5 reps with LUE.  PT educated pt &  wife with demo & verbal cues. Pt return demo & wife verbalized understanding.     12/16/2022: Prosthetic Training with Transtibial Prostheses: New wound superficial on medial femoral condyle 8mm diameter and 2nd new scratch near medial gastroc head (appears from scratching limb with fingernails). 2 previous wounds still present wound 7mm over patella and 2nd wound posterior gastroc belly area 3mm. No signs of infection.  Only wound over patella covered with Tegaderm. PT covered remaining wounds. PT educated on sitting posture with feet wide so he does not ER which prosthesis puts pressure on medial femoral condyle where he got new wound. Pt & CNA verbalized understanding.  Pt amb 101' (max tolerable distance) with rollator walker with supervision / verbal cues on posture including position within walker.    Therapeutic Exercise: Hamstring & gastroc stretches. See HEP below. PT educated with demo & verbal with HO. PT hand written directions for controlled trunk flexion to actively work back extensors placing hands on locked rollator seat then back to upright. 5 reps leading up & down with RUE & 5 reps with LUE.  Pt verbalized and return demo  understanding of  3 exercises.  Step up & step down on 4" block with locked rollator walker 1 rep ea leading RLE &  leading LLE with minA.    12/11/2022: Prosthetic Training with Transtibial Prostheses: Right residual limb with wound 7mm over patella and 2nd wound posterior gastroc belly area 3mm. Other wounds have healed. No signs of infection.  Pt amb 80', 20' & 6' with rollator walker with cues for upright posture looking forward / not staring at floor and staying close to rollator walker.  Therapeutic Exercise: Attempted recumbent bike as gym has one, but unable to adjust seat so feet would stay on pedal or allow knee flexion.  Nustep seat 10 level 5 with BLEs & BUEs 8 min with cues to hold head up / upper body posture.  Standing with single UE support on locked stabilized rollator walker: green theraband 5 reps 2 sets ea exercise BUEs for row, forward reach & bicep curls.  Sitting on 24" bar stool with feet on floor: upright trunk without UE assist for 2 min with verbal & tactile cues to correct posture.    HOME EXERCISE PROGRAM: Access Code: [email or web address] URL: [email or web address] Date: 12/16/2022 Prepared by: Vladimir Faster  Exercises - Seated Hamstring Stretch with Strap  - 1 x daily - 7 x weekly - 1 sets - 2-3 reps - 30 seconds hold - standing calf stretch with forefoot on small step or brick  - 1 x daily - 7 x weekly - 1 sets - 2-3 reps - 30 seconds hold   Increasing your activity level is important.  Short distances which is walking from one room to another. Work to increase frequency back to prior level. Start with walk each hour during awake hours, then every 45-50 min, decreasing until he is moving some every 30 min during day   Medium distances are entering & exiting your home or community with limited distances. Start with 4 medium walks which is one outing to one location and increase number of tolerated amounts per day.  Long distance is your highest tolerance for you. Walk until you feel you must rest. Back or leg pain or general fatigue are indicators to maximum  tolerance. Monitor by distance or time. Try to walk your BEST distance 1-2 times per day. You should see this increase over time.      ASSESSMENT:  CLINICAL IMPRESSION: He was very fatigued today as well as dealing with pressure sacral sore and I did recommend he go have this checked out and he verbalizes understanding.   OBJECTIVE IMPAIRMENTS: Abnormal gait, decreased activity tolerance, decreased balance, decreased endurance, decreased knowledge of use of DME, decreased mobility, difficulty walking, decreased ROM, decreased strength, increased edema, postural dysfunction, and prosthetic dependency .   ACTIVITY LIMITATIONS: carrying, standing, stairs, transfers, locomotion level, and standing ADLs  PARTICIPATION LIMITATIONS: community activity and household mobility  PERSONAL FACTORS: Age, Fitness, Past/current experiences, Time since onset of injury/illness/exacerbation, and 3+ comorbidities: see PMH  are also affecting patient's functional outcome.   REHAB POTENTIAL: Good  CLINICAL DECISION MAKING: Evolving/moderate complexity  EVALUATION COMPLEXITY: Moderate   GOALS: Goals reviewed with patient? Yes  SHORT TERM GOALS: Target date: 01/02/2023  Patient verbalizes proper skin care & checking limbs.  Baseline: SEE OBJECTIVE DATA Goal status: MET 12/23/22 2.  Patient tolerates prostheses >12 hrs total /day without skin issues or limb pain Baseline: SEE OBJECTIVE DATA Goal status: MET 12/23/22, he reports he wears them all day long  3.  Patient able  to reach 10" and look over both shoulders with single UE support on RW safely. Baseline: SEE OBJECTIVE DATA Goal status: MET 12/23/22, was able to reach 11 inches  4. Patient ambulates 200' with RW & prostheses with supervision. Baseline: SEE OBJECTIVE DATA Goal status: Ongoing 12/23/2022, 150 feet was max tolerated distance today  5. Patient negotiates ramps & curbs with RW & prostheses with minA. Baseline: SEE OBJECTIVE DATA Goal  status: MET 12/23/22  LONG TERM GOALS: Target date: 01/30/2023  Patient demonstrates & verbalized understanding of prosthetic care to enable safe utilization of prosthesis. Baseline: SEE OBJECTIVE DATA Goal status:   Ongoing 12/11/2022  Patient tolerates prostheses wear >90% of awake hours without skin or limb pain issues. Baseline: SEE OBJECTIVE DATA Goal status: Ongoing 12/11/2022  Tasks of [employer] with RW support >/= 30/56 Baseline: SEE OBJECTIVE DATA Goal status: Ongoing 12/11/2022  Patient ambulates >300' with prosthesis & LRAD independently Baseline: SEE OBJECTIVE DATA Goal status: Ongoing 12/11/2022  Patient negotiates ramps, curbs & stairs with single rail with prostheses & LRAD modified independent. Baseline: SEE OBJECTIVE DATA Goal status: Ongoing 12/11/2022  Patient verbalizes & demonstrates understanding of how to properly use fitness equipment to return to gym. Baseline: SEE OBJECTIVE DATA Goal status:  Ongoing 12/11/2022  PLAN:  PT FREQUENCY: 2x/week  PT DURATION: 12 weeks  PLANNED INTERVENTIONS: Therapeutic exercises, Therapeutic activity, Neuromuscular re-education, Balance training, Gait training, Patient/Family education, Self Care, Stair training, Prosthetic training, DME instructions, Aquatic Therapy, Re-evaluation, and physical performance testing  PLAN FOR NEXT SESSION:    continue to Work towards STGs,  continue prosthetic gait with rollator walker. Balance activities and functional exercises to promote an upright trunk. Strengthening especially core & LEs    April Manson, PT, DPT 01/02/2023, 11:14 AM

## 2023-01-06 ENCOUNTER — Encounter: Payer: Self-pay | Admitting: Physical Therapy

## 2023-01-06 ENCOUNTER — Ambulatory Visit (INDEPENDENT_AMBULATORY_CARE_PROVIDER_SITE_OTHER): Payer: Medicare Other | Admitting: Physical Therapy

## 2023-01-06 DIAGNOSIS — Z9181 History of falling: Secondary | ICD-10-CM

## 2023-01-06 DIAGNOSIS — R293 Abnormal posture: Secondary | ICD-10-CM

## 2023-01-06 DIAGNOSIS — M6281 Muscle weakness (generalized): Secondary | ICD-10-CM

## 2023-01-06 DIAGNOSIS — R2689 Other abnormalities of gait and mobility: Secondary | ICD-10-CM

## 2023-01-06 DIAGNOSIS — L98498 Non-pressure chronic ulcer of skin of other sites with other specified severity: Secondary | ICD-10-CM

## 2023-01-06 DIAGNOSIS — R2681 Unsteadiness on feet: Secondary | ICD-10-CM

## 2023-01-06 DIAGNOSIS — Z7409 Other reduced mobility: Secondary | ICD-10-CM

## 2023-01-06 NOTE — Therapy (Signed)
OUTPATIENT PHYSICAL THERAPY PROSTHETIC TREATMENT    Patient Name: [name] MRN: [id number] DOB:[birth year], 72 y.o., male Today's Date: 01/06/2023  PCP: Adolph Pollack, FNP REFERRING PROVIDER: Aldean Baker, MD  END OF SESSION:  PT End of Session - 01/06/23 1145     Visit Number 13    Number of Visits 25    Date for PT Re-Evaluation 01/30/23    Authorization Type Medicare & BCBS supplement    Progress Note Due on Visit 20    PT Start Time 1143    PT Stop Time 1228    PT Time Calculation (min) 45 min    Equipment Utilized During Treatment Gait belt    Activity Tolerance Patient tolerated treatment well    Behavior During Therapy WFL for tasks assessed/performed                       Past Medical History:  Diagnosis Date   Acute osteomyelitis of metatarsal bone of left foot (HCC)    Ambulates with cane    straight cane   Anemia    Cervical myelopathy (HCC) 02/06/2018   Chronic kidney disease    dailysis M W F- home   Community acquired pneumonia of left lower lobe of lung 08/18/2021   Complication of anesthesia    Coronary artery disease    Dehiscence of amputation stump of left lower extremity (HCC)    Diabetes mellitus without complication (HCC)    type2   Diabetic foot ulcer (HCC)    Diabetic neuropathy (HCC) 02/06/2018   Dysrhythmia    Gait abnormality 08/24/2018   GERD (gastroesophageal reflux disease)     01/06/20- not current   Gout    History of blood transfusion    History of blood transfusion    History of kidney stones    passed stones   Hypercholesteremia    Hypertension    Hypothyroidism    Neuromuscular disorder (HCC)    neuropathy left leg and bilateral feet   Neuropathy    Partial nontraumatic amputation of foot, 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   PONV (postoperative nausea and vomiting)    Prostate cancer (HCC)    PVD (peripheral vascular disease) (HCC)    with amputations   Past Surgical History:  Procedure Laterality Date    A-FLUTTER ABLATION N/A 04/06/2020   Procedure: A-FLUTTER ABLATION;  Surgeon: Marinus Maw, MD;  Location: [facility] INVASIVE [facility];  Service: Cardiovascular;  Laterality: N/A;   AMPUTATION Left 12/25/2013   Procedure: AMPUTATION RAY LEFT 5TH RAY;  Surgeon: Nadara Mustard, MD;  Location: [facility] ORS;  Service: Orthopedics;  Laterality: Left;   AMPUTATION Right 12/15/2018   Procedure: AMPUTATION OF 4TH AND 5TH TOES RIGHT FOOT;  Surgeon: Nadara Mustard, MD;  Location: [facility] OR;  Service: Orthopedics;  Laterality: Right;   AMPUTATION Left 02/02/2021   Procedure: LEFT FOOT 4TH RAY AMPUTATION;  Surgeon: Nadara Mustard, MD;  Location: [facility] OR;  Service: Orthopedics;  Laterality: Left;   AMPUTATION Left 05/09/2021   Procedure: LEFT BELOW KNEE AMPUTATION;  Surgeon: Nadara Mustard, MD;  Location: [facility] OR;  Service: Orthopedics;  Laterality: Left;   AMPUTATION Right 07/26/2022   Procedure: RIGHT BELOW KNEE AMPUTATION;  Surgeon: Nadara Mustard, MD;  Location: [facility] OR;  Service: Orthopedics;  Laterality: Right;   APPLICATION OF WOUND VAC Right 12/15/2018   Procedure: APPLICATION OF WOUND VAC;  Surgeon: Nadara Mustard, MD;  Location: [facility] OR;  Service: Orthopedics;  Laterality: Right;   AV FISTULA PLACEMENT Left 03/25/2019   Procedure: LEFT ARM ARTERIOVENOUS (AV) FISTULA CREATION;  Surgeon: Nada Libman, MD;  Location: [facility] OR;  Service: Vascular;  Laterality: Left;   BACK SURGERY     BASCILIC VEIN TRANSPOSITION Left 05/20/2019   Procedure: SECOND STAGE LEFT BASCILIC VEIN TRANSPOSITION;  Surgeon: Nada Libman, MD;  Location: [facility] OR;  Service: Vascular;  Laterality: Left;   BIOPSY  06/22/2022   Procedure: BIOPSY;  Surgeon: Lemar Lofty., MD;  Location: [facility] ENDOSCOPY;  Service: Gastroenterology;;   CARDIAC CATHETERIZATION  02/17/2014   CHOLECYSTECTOMY     COLONOSCOPY  2011   in [place], Normal   COLONOSCOPY N/A 06/23/2022   Procedure: COLONOSCOPY;  Surgeon: Sherrilyn Rist, MD;  Location: [facility] ENDOSCOPY;  Service:  Gastroenterology;  Laterality: N/A;   CORONARY ARTERY BYPASS GRAFT  2008   CYSTOSCOPY N/A 06/29/2020   Procedure: CYSTOSCOPY WITH CLOT EVACUATION AND  FULGERATION;  Surgeon: Marcine Matar, MD;  Location: [facility] OR;  Service: Urology;  Laterality: N/A;   CYSTOSCOPY WITH FULGERATION Bilateral 06/17/2020   Procedure: CYSTOSCOPY,BILATERAL RETROGRADE, CLOT EVACUATION WITH FULGERATION OF THE BLADDER;  Surgeon: Jannifer Hick, MD;  Location: [facility] OR;  Service: Urology;  Laterality: Bilateral;   CYSTOSCOPY WITH FULGERATION N/A 06/28/2020   Procedure: CYSTOSCOPY WITH CLOT EVACUATION AND FULGERATION OF BLEEDERS;  Surgeon: Marcine Matar, MD;  Location: [facility] OR;  Service: Urology;  Laterality: N/A;  1 HR   ENTEROSCOPY N/A 06/22/2022   Procedure: ENTEROSCOPY;  Surgeon: Meridee Score Netty Starring., MD;  Location: [facility] ENDOSCOPY;  Service: Gastroenterology;  Laterality: N/A;   HEMOSTASIS CLIP PLACEMENT  06/22/2022   Procedure: HEMOSTASIS CLIP PLACEMENT;  Surgeon: Lemar Lofty., MD;  Location: [facility] ENDOSCOPY;  Service: Gastroenterology;;   HOT HEMOSTASIS N/A 06/22/2022   Procedure: HOT HEMOSTASIS (ARGON PLASMA COAGULATION/BICAP);  Surgeon: Lemar Lofty., MD;  Location: [facility] ENDOSCOPY;  Service: Gastroenterology;  Laterality: N/A;   I & D EXTREMITY Right 12/15/2018   Procedure: DEBRIDEMENT RIGHT FOOT;  Surgeon: Nadara Mustard, MD;  Location: [facility] OR;  Service: Orthopedics;  Laterality: Right;   I & D EXTREMITY Right 08/27/2019   Procedure: PARTIAL CUBOID EXCISION RIGHT FOOT;  Surgeon: Nadara Mustard, MD;  Location: [facility] OR;  Service: Orthopedics;  Laterality: Right;   I & D EXTREMITY Right 01/07/2020   Procedure: RIGHT FOOT EXCISION INFECTED BONE;  Surgeon: Nadara Mustard, MD;  Location: [facility] OR;  Service: Orthopedics;  Laterality: Right;   I & D EXTREMITY Right 05/17/2022   Procedure: IRRIGATION AND DEBRIDEMENT RIGHT FOOT ABSCESS;  Surgeon: Nadara Mustard, MD;  Location: [facility] OR;  Service: Orthopedics;  Laterality:  Right;   IR PARACENTESIS  10/11/2022   IR PARACENTESIS  12/24/2022   NECK SURGERY     novemver 2019   POLYPECTOMY  06/22/2022   Procedure: POLYPECTOMY;  Surgeon: Meridee Score Netty Starring., MD;  Location: [facility] ENDOSCOPY;  Service: Gastroenterology;;   POLYPECTOMY  06/23/2022   Procedure: POLYPECTOMY;  Surgeon: Sherrilyn Rist, MD;  Location: [facility] ENDOSCOPY;  Service: Gastroenterology;;   STUMP REVISION Left 06/27/2021   Procedure: REVISION LEFT BELOW KNEE AMPUTATION;  Surgeon: Nadara Mustard, MD;  Location: [facility] OR;  Service: Orthopedics;  Laterality: Left;   SUBMUCOSAL LIFTING INJECTION  06/22/2022   Procedure: SUBMUCOSAL LIFTING INJECTION;  Surgeon: Meridee Score Netty Starring., MD;  Location: [facility] ENDOSCOPY;  Service: Gastroenterology;;   SUBMUCOSAL TATTOO INJECTION  06/22/2022   Procedure: SUBMUCOSAL TATTOO INJECTION;  Surgeon: Meridee Score,  Netty Starring., MD;  Location: [facility] ENDOSCOPY;  Service: Gastroenterology;;   TRANSURETHRAL RESECTION OF PROSTATE N/A 06/28/2020   Procedure: TRANSURETHRAL RESECTION OF THE PROSTATE (TURP);  Surgeon: Marcine Matar, MD;  Location: [facility] OR;  Service: Urology;  Laterality: N/A;   WISDOM TOOTH EXTRACTION     Patient Active Problem List   Diagnosis Date Noted   Right below-knee amputee (HCC) 07/29/2022   Severe sepsis (HCC) 07/23/2022   Foot infection 07/22/2022   Acute respiratory failure (HCC) 07/22/2022   Anemia due to chronic blood loss 06/23/2022   Heme positive stool 06/23/2022   Benign neoplasm of cecum 06/23/2022   Benign neoplasm of transverse colon 06/23/2022   Benign neoplasm of descending colon 06/23/2022   Abscess of right foot 05/17/2022   Contraindication to anticoagulation therapy GU Bleed 03/13/2022   Paroxysmal atrial fibrillation (HCC) 08/18/2021   Anemia of chronic renal failure 08/18/2021   Thrombocytopenia (HCC) 08/18/2021   Acute metabolic encephalopathy 08/18/2021   Action induced myoclonus 08/18/2021   Left below-knee amputee (HCC) 05/11/2021    Symptomatic anemia 09/13/2020   COVID-19 virus infection 09/13/2020   Pressure injury of skin 06/29/2020   Gross hematuria 06/16/2020   Typical atrial flutter (HCC) 03/24/2020   Bilateral pleural effusion 02/24/2020   Healthcare maintenance 01/28/2020   Shortness of breath 01/28/2020   History of partial ray amputation of fourth toe of right foot (HCC) 09/08/2019   Chronic cough 06/25/2019   Cutaneous abscess of right foot    Subacute osteomyelitis, right ankle and foot (HCC) 12/14/2018   AKI (acute kidney injury) (HCC) 12/14/2018   ESRD on dialysis (HCC) 12/14/2018   S/P CABG (coronary artery bypass graft) 12/11/2018   Gait abnormality 08/24/2018   Diabetic neuropathy (HCC) 02/06/2018   Cervical myelopathy (HCC) 02/06/2018   Onychomycosis 10/30/2017   Other spondylosis with radiculopathy, cervical region 01/27/2017   Midfoot ulcer, right, limited to breakdown of skin (HCC) 11/15/2016   Lateral epicondylitis, left elbow 08/12/2016   Prostate cancer (HCC) 06/19/2016   Cellulitis of right foot 12/24/2015   Gout 09/14/2012   Hypertension associated with diabetes (HCC) 09/14/2012   Hypothyroidism 09/14/2012   CAD (coronary artery disease) 09/14/2012   Insulin dependent type 2 diabetes mellitus (HCC) 09/14/2012   Hyperlipidemia associated with type 2 diabetes mellitus (HCC)    Neuropathy (HCC)     ONSET DATE: 10/31/2022 right BKA prosthesis delivery  REFERRING DIAG:  G64.403K (ICD-10-CM) - Below-knee amputation of left lower extremity, initial encounter (HCC)  V42.595G (ICD-10-CM) - Below-knee amputation of right lower extremity, initial encounter (HCC)   THERAPY DIAG:  Unsteadiness on feet  Other abnormalities of gait and mobility  Muscle weakness (generalized)  Abnormal posture  History of falling  Impaired functional mobility, balance, gait, and endurance  Non-pressure chronic ulcer of skin of other sites with other specified severity (HCC)  Rationale for  Evaluation and Treatment: Rehabilitation  SUBJECTIVE:   SUBJECTIVE STATEMENT:  He was told that he needs to see a surgeon regarding his sacral wound.  He had the left eye cataract sg 1 week ago and is having right eye next week.  He was told okay for PT and no lifting / work-out for 1 week after each surgery.    PERTINENT HISTORY: ESRD on home dialysis, HTN, HLD, CAD, DM2, PVD, prostate CA, cervical myelopathy,    PAIN:  Are you having pain? No  PRECAUTIONS: Fall and Other: No BP LUE  WEIGHT BEARING RESTRICTIONS: No  FALLS: Has patient fallen in last 6  months? Yes. Number of falls 1 his left prosthesis slipped off & denies injuries.   LIVING ENVIRONMENT: Lives with: lives with their spouse and CNA Mon-Fri 1:00-5:00 Lives in: House  Home Access: Ramped entrance Home layout:  3 level enters on middle floor which has bedroom & full bath. Stairs: downstairs has chair lift.  Has following equipment at home: Single point cane, Walker - 2 wheeled, Environmental consultant - 4 wheeled, Wheelchair (manual), [employer], Grab bars, and Ramped entry  OCCUPATION: retired  PLOF: he was walking with RW with left prosthesis.    PATIENT GOALS:   to use 2 prostheses to walk with cane. He wants to bring garbage cans up sloped driveway.    OBJECTIVE:  COGNITION: Eval on 11/06/2022:  Overall cognitive status: Within functional limits for tasks assessed  POSTURE: Eval on 11/06/2022: rounded shoulders, forward head, flexed trunk , and weight shift left  LOWER EXTREMITY ROM:  ROM P:passive  A:active Right eval  Hip flexion   Hip extension   Hip abduction   Hip adduction   Hip internal rotation   Hip external rotation   Knee flexion   Knee extension seated P: -6*  Ankle dorsiflexion   Ankle plantarflexion   Ankle inversion   Ankle eversion    (Blank rows = not tested)  LOWER EXTREMITY MMT:  MMT Right eval Left eval Rt/Lt 12/23/22  Hip flexion 4/5 4/5 4+/4+  Hip extension 3/5 3+5 4/4  Hip  abduction 35 3+5 4/4  Hip adduction     Hip internal rotation     Hip external rotation     Knee flexion 3+/5 4/5 4/4  Knee extension 3+/5 4/5 4+/4+  Eval MMT gross seated & functional  (Blank rows = not tested)  TRANSFERS: Eval on 11/06/2022: Sit to stand: SBA 22" w/c using BUEs on armrests requires RW for stabilization Stand to sit: SBA RW for stabilization to 22" w/c using BUEs on armrests  GAIT: 12/16/2022: Pt amb 101' (max tolerable distance) with rollator walker with supervision / verbal cues on posture including position within walker.    Eval on 11/06/2022: Gait pattern: step to pattern, decreased step length- Left, decreased stance time- Right, Right hip hike, Left hip hike, knee flexed in stance- Right, lateral hip instability, trunk flexed, and wide BOS Distance walked: 25' turning 180* CW & CCW Assistive device utilized: Environmental consultant - 2 wheeled and bilateral TTA prostheses Level of assistance: Min A  FUNCTIONAL TESTs:  Eval on 11/06/2022: Standing balance with RW support with close supervision: reaches 5" anteriorly, looks to side only when looking over shoulders and reaches towards floor within 10".    CURRENT PROSTHETIC WEAR ASSESSMENT: 11/06/2022:  Patient is independent with: prosthetic cleaning and ply sock cleaning Patient is dependent with: skin check, residual limb care, proper wear schedule/adjustment, and proper weight-bearing schedule/adjustment Donning prosthesis: SBA / verbal cues Doffing prosthesis: Modified independence Prosthetic wear tolerance: pt reports wearing left prosthesis daily all awake hours for several months.  Right prosthesis has worn daily for 6 days since receiving it for most of awake hours.  Prosthetic weight bearing tolerance: 5 minutes with no c/o pain Edema: right limb has pitting edema with 5 sec capillary refill.  Left limb has no edema noted. Residual limb condition: Left limb has superficial wound 5mm with white perimeter from excessive  fluid & CNA reports some drainage;  Right limb has no open areas, distal area has redness & shiny skin, normal temperature, no hair growth.  Prosthetic description: silicon  liners (9mm over tibia & 6mm posterior portion) with pin lock suspension, socket with ratchet suspension, flexible Keel feet     TODAY'S TREATMENT:                                                                                                                             DATE:  01/06/2023: Prosthetic Training with Bilateral Transtibial Prostheses:  PT discussed need to disclose sacral wound to eye doctor prior to surgery. PT recommended sidelying at least 1 hour 2x/day.  Pt & wife verbalized understanding of PT recommendation PT reviewed with demo & verbal cues on rotation of socket, need to tighten straps to control depth of limb into socket and positioning in sitting. Pt & wife verbalized understanding.  PT reviewed need to progress activities for time & functional strength needed.  Pt wants to walk to his seat at [facility] which is long distance & steps with single rail available.  Pt's wife verbalized understanding of PT concerns for attempting with large increase in distance and steps. Pt amb with rollator walker 70' with CGA and neg 4" curb (1 rep leading LLE & 1 rep leading RLE) with MinA / verbal cues for technique.  PT demo & verbal cues on rationale for ~1/4 foot of stance LE over edge to descend steps which allows smoother knee flexion.  Pt neg 6" curb with minA & verbal cues.     01/02/2023: Prosthetic Training with Bilateral Transtibial Prostheses:  Pt amb 30' X 2 with rollator and CGA Pt amb 75 feet X 2  with rollator X 2 with CGA Pt navigated 6.5 inch step with rollator and min A, cues and demo for techinque   12/25/2022: Prosthetic Training with Bilateral Transtibial Prostheses:  Pt amb 150' max tolerated distance with rollator and CGA Pt amb 75 feet with rollator X 2 with CGA In // bars with bilat UE  support performing step ups onto 6 inch step leading with Rt leg 3 reps, then leading with left leg 3 reps, then needed extended rest break and performed another 3 reps on each side. CGA to min A  Stand to sit and sit to stand with rollator and min A for balance/unsteadiness overall    HOME EXERCISE PROGRAM: Access Code: [email or web address] URL: [email or web address] Date: 12/16/2022 Prepared by: Vladimir Faster  Exercises - Seated Hamstring Stretch with Strap  - 1 x daily - 7 x weekly - 1 sets - 2-3 reps - 30 seconds hold - standing calf stretch with forefoot on small step or brick  - 1 x daily - 7 x weekly - 1 sets - 2-3 reps - 30 seconds hold   Increasing your activity level is important.  Short distances which is walking from one room to another. Work to increase frequency back to prior level. Start with walk each hour during awake hours, then every 45-50 min, decreasing until he is moving some every 30 min during day   Medium  distances are entering & exiting your home or community with limited distances. Start with 4 medium walks which is one outing to one location and increase number of tolerated amounts per day.  Long distance is your highest tolerance for you. Walk until you feel you must rest. Back or leg pain or general fatigue are indicators to maximum tolerance. Monitor by distance or time. Try to walk your BEST distance 1-2 times per day. You should see this increase over time.      ASSESSMENT:  CLINICAL IMPRESSION:  patient is improving gait requiring less assistance but continues to be limited in endurance.  He improved his ability to negotiate a step with PT cues on technique.   OBJECTIVE IMPAIRMENTS: Abnormal gait, decreased activity tolerance, decreased balance, decreased endurance, decreased knowledge of use of DME, decreased mobility, difficulty walking, decreased ROM, decreased strength, increased edema, postural dysfunction, and prosthetic dependency .    ACTIVITY LIMITATIONS: carrying, standing, stairs, transfers, locomotion level, and standing ADLs  PARTICIPATION LIMITATIONS: community activity and household mobility  PERSONAL FACTORS: Age, Fitness, Past/current experiences, Time since onset of injury/illness/exacerbation, and 3+ comorbidities: see PMH  are also affecting patient's functional outcome.   REHAB POTENTIAL: Good  CLINICAL DECISION MAKING: Evolving/moderate complexity  EVALUATION COMPLEXITY: Moderate   GOALS: Goals reviewed with patient? Yes  SHORT TERM GOALS: Target date: 01/02/2023  Patient verbalizes proper skin care & checking limbs.  Baseline: SEE OBJECTIVE DATA Goal status: MET 12/23/22 2.  Patient tolerates prostheses >12 hrs total /day without skin issues or limb pain Baseline: SEE OBJECTIVE DATA Goal status: MET 12/23/22, he reports he wears them all day long  3.  Patient able to reach 10" and look over both shoulders with single UE support on RW safely. Baseline: SEE OBJECTIVE DATA Goal status: MET 12/23/22, was able to reach 11 inches  4. Patient ambulates 200' with RW & prostheses with supervision. Baseline: SEE OBJECTIVE DATA Goal status: Ongoing 12/23/2022, 150 feet was max tolerated distance today  5. Patient negotiates ramps & curbs with RW & prostheses with minA. Baseline: SEE OBJECTIVE DATA Goal status: MET 12/23/22  LONG TERM GOALS: Target date: 01/30/2023  Patient demonstrates & verbalized understanding of prosthetic care to enable safe utilization of prosthesis. Baseline: SEE OBJECTIVE DATA Goal status:   Ongoing 12/11/2022  Patient tolerates prostheses wear >90% of awake hours without skin or limb pain issues. Baseline: SEE OBJECTIVE DATA Goal status: Ongoing 12/11/2022  Tasks of [employer] with RW support >/= 30/56 Baseline: SEE OBJECTIVE DATA Goal status: Ongoing 12/11/2022  Patient ambulates >300' with prosthesis & LRAD independently Baseline: SEE OBJECTIVE DATA Goal status: Ongoing  12/11/2022  Patient negotiates ramps, curbs & stairs with single rail with prostheses & LRAD modified independent. Baseline: SEE OBJECTIVE DATA Goal status: Ongoing 12/11/2022  Patient verbalizes & demonstrates understanding of how to properly use fitness equipment to return to gym. Baseline: SEE OBJECTIVE DATA Goal status:  Ongoing 12/11/2022  PLAN:  PT FREQUENCY: 2x/week  PT DURATION: 12 weeks  PLANNED INTERVENTIONS: Therapeutic exercises, Therapeutic activity, Neuromuscular re-education, Balance training, Gait training, Patient/Family education, Self Care, Stair training, Prosthetic training, DME instructions, Aquatic Therapy, Re-evaluation, and physical performance testing  PLAN FOR NEXT SESSION:  continue to Work towards LTGs,  continue prosthetic gait with rollator walker. Balance activities and functional exercises to promote an upright trunk. Strengthening especially core & LEs    Vladimir Faster, PT, DPT 01/06/2023, 12:53 PM

## 2023-01-08 ENCOUNTER — Encounter: Payer: Self-pay | Admitting: Physical Therapy

## 2023-01-08 ENCOUNTER — Ambulatory Visit (INDEPENDENT_AMBULATORY_CARE_PROVIDER_SITE_OTHER): Payer: Medicare Other | Admitting: Physical Therapy

## 2023-01-08 DIAGNOSIS — R293 Abnormal posture: Secondary | ICD-10-CM

## 2023-01-08 DIAGNOSIS — R2681 Unsteadiness on feet: Secondary | ICD-10-CM

## 2023-01-08 DIAGNOSIS — M6281 Muscle weakness (generalized): Secondary | ICD-10-CM | POA: Diagnosis not present

## 2023-01-08 DIAGNOSIS — R2689 Other abnormalities of gait and mobility: Secondary | ICD-10-CM | POA: Diagnosis not present

## 2023-01-08 DIAGNOSIS — Z7409 Other reduced mobility: Secondary | ICD-10-CM

## 2023-01-08 DIAGNOSIS — Z9181 History of falling: Secondary | ICD-10-CM

## 2023-01-08 NOTE — Therapy (Signed)
OUTPATIENT PHYSICAL THERAPY PROSTHETIC TREATMENT    Patient Name: [name] MRN: [id number] DOB:[birth year], 72 y.o., male Today's Date: 01/08/2023  PCP: Adolph Pollack, FNP REFERRING PROVIDER: Aldean Baker, MD  END OF SESSION:  PT End of Session - 01/08/23 1148     Visit Number 14    Number of Visits 25    Date for PT Re-Evaluation 01/30/23    Authorization Type Medicare & BCBS supplement    Progress Note Due on Visit 20    PT Start Time 1145    PT Stop Time 1228    PT Time Calculation (min) 43 min    Equipment Utilized During Treatment Gait belt    Activity Tolerance Patient tolerated treatment well    Behavior During Therapy WFL for tasks assessed/performed                        Past Medical History:  Diagnosis Date   Acute osteomyelitis of metatarsal bone of left foot (HCC)    Ambulates with cane    straight cane   Anemia    Cervical myelopathy (HCC) 02/06/2018   Chronic kidney disease    dailysis M W F- home   Community acquired pneumonia of left lower lobe of lung 08/18/2021   Complication of anesthesia    Coronary artery disease    Dehiscence of amputation stump of left lower extremity (HCC)    Diabetes mellitus without complication (HCC)    type2   Diabetic foot ulcer (HCC)    Diabetic neuropathy (HCC) 02/06/2018   Dysrhythmia    Gait abnormality 08/24/2018   GERD (gastroesophageal reflux disease)     01/06/20- not current   Gout    History of blood transfusion    History of blood transfusion    History of kidney stones    passed stones   Hypercholesteremia    Hypertension    Hypothyroidism    Neuromuscular disorder (HCC)    neuropathy left leg and bilateral feet   Neuropathy    Partial nontraumatic amputation of foot, 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   PONV (postoperative nausea and vomiting)    Prostate cancer (HCC)    PVD (peripheral vascular disease) (HCC)    with amputations   Past Surgical History:  Procedure Laterality Date    A-FLUTTER ABLATION N/A 04/06/2020   Procedure: A-FLUTTER ABLATION;  Surgeon: Marinus Maw, MD;  Location: [facility];  Service: Cardiovascular;  Laterality: N/A;   AMPUTATION Left 12/25/2013   Procedure: AMPUTATION RAY LEFT 5TH RAY;  Surgeon: Nadara Mustard, MD;  Location: [facility] ORS;  Service: Orthopedics;  Laterality: Left;   AMPUTATION Right 12/15/2018   Procedure: AMPUTATION OF 4TH AND 5TH TOES RIGHT FOOT;  Surgeon: Nadara Mustard, MD;  Location: [facility] OR;  Service: Orthopedics;  Laterality: Right;   AMPUTATION Left 02/02/2021   Procedure: LEFT FOOT 4TH RAY AMPUTATION;  Surgeon: Nadara Mustard, MD;  Location: [facility] OR;  Service: Orthopedics;  Laterality: Left;   AMPUTATION Left 05/09/2021   Procedure: LEFT BELOW KNEE AMPUTATION;  Surgeon: Nadara Mustard, MD;  Location: [facility] OR;  Service: Orthopedics;  Laterality: Left;   AMPUTATION Right 07/26/2022   Procedure: RIGHT BELOW KNEE AMPUTATION;  Surgeon: Nadara Mustard, MD;  Location: [facility] OR;  Service: Orthopedics;  Laterality: Right;   APPLICATION OF WOUND VAC Right 12/15/2018   Procedure: APPLICATION OF WOUND VAC;  Surgeon: Nadara Mustard, MD;  Location: [facility] OR;  Service:  Orthopedics;  Laterality: Right;   AV FISTULA PLACEMENT Left 03/25/2019   Procedure: LEFT ARM ARTERIOVENOUS (AV) FISTULA CREATION;  Surgeon: Nada Libman, MD;  Location: [facility] OR;  Service: Vascular;  Laterality: Left;   BACK SURGERY     BASCILIC VEIN TRANSPOSITION Left 05/20/2019   Procedure: SECOND STAGE LEFT BASCILIC VEIN TRANSPOSITION;  Surgeon: Nada Libman, MD;  Location: [facility] OR;  Service: Vascular;  Laterality: Left;   BIOPSY  06/22/2022   Procedure: BIOPSY;  Surgeon: Lemar Lofty., MD;  Location: [facility] ENDOSCOPY;  Service: Gastroenterology;;   CARDIAC CATHETERIZATION  02/17/2014   CHOLECYSTECTOMY     COLONOSCOPY  2011   in [place], Normal   COLONOSCOPY N/A 06/23/2022   Procedure: COLONOSCOPY;  Surgeon: Sherrilyn Rist, MD;  Location: [facility] ENDOSCOPY;  Service:  Gastroenterology;  Laterality: N/A;   CORONARY ARTERY BYPASS GRAFT  2008   CYSTOSCOPY N/A 06/29/2020   Procedure: CYSTOSCOPY WITH CLOT EVACUATION AND  FULGERATION;  Surgeon: Marcine Matar, MD;  Location: [facility] OR;  Service: Urology;  Laterality: N/A;   CYSTOSCOPY WITH FULGERATION Bilateral 06/17/2020   Procedure: CYSTOSCOPY,BILATERAL RETROGRADE, CLOT EVACUATION WITH FULGERATION OF THE BLADDER;  Surgeon: Jannifer Hick, MD;  Location: [facility] OR;  Service: Urology;  Laterality: Bilateral;   CYSTOSCOPY WITH FULGERATION N/A 06/28/2020   Procedure: CYSTOSCOPY WITH CLOT EVACUATION AND FULGERATION OF BLEEDERS;  Surgeon: Marcine Matar, MD;  Location: [facility] OR;  Service: Urology;  Laterality: N/A;  1 HR   ENTEROSCOPY N/A 06/22/2022   Procedure: ENTEROSCOPY;  Surgeon: Meridee Score Netty Starring., MD;  Location: [facility] ENDOSCOPY;  Service: Gastroenterology;  Laterality: N/A;   HEMOSTASIS CLIP PLACEMENT  06/22/2022   Procedure: HEMOSTASIS CLIP PLACEMENT;  Surgeon: Lemar Lofty., MD;  Location: [facility] ENDOSCOPY;  Service: Gastroenterology;;   HOT HEMOSTASIS N/A 06/22/2022   Procedure: HOT HEMOSTASIS (ARGON PLASMA COAGULATION/BICAP);  Surgeon: Lemar Lofty., MD;  Location: [facility] ENDOSCOPY;  Service: Gastroenterology;  Laterality: N/A;   I & D EXTREMITY Right 12/15/2018   Procedure: DEBRIDEMENT RIGHT FOOT;  Surgeon: Nadara Mustard, MD;  Location: [facility] OR;  Service: Orthopedics;  Laterality: Right;   I & D EXTREMITY Right 08/27/2019   Procedure: PARTIAL CUBOID EXCISION RIGHT FOOT;  Surgeon: Nadara Mustard, MD;  Location: [facility] OR;  Service: Orthopedics;  Laterality: Right;   I & D EXTREMITY Right 01/07/2020   Procedure: RIGHT FOOT EXCISION INFECTED BONE;  Surgeon: Nadara Mustard, MD;  Location: [facility] OR;  Service: Orthopedics;  Laterality: Right;   I & D EXTREMITY Right 05/17/2022   Procedure: IRRIGATION AND DEBRIDEMENT RIGHT FOOT ABSCESS;  Surgeon: Nadara Mustard, MD;  Location: [facility] OR;  Service: Orthopedics;  Laterality:  Right;   IR PARACENTESIS  10/11/2022   IR PARACENTESIS  12/24/2022   NECK SURGERY     novemver 2019   POLYPECTOMY  06/22/2022   Procedure: POLYPECTOMY;  Surgeon: Meridee Score Netty Starring., MD;  Location: [facility] ENDOSCOPY;  Service: Gastroenterology;;   POLYPECTOMY  06/23/2022   Procedure: POLYPECTOMY;  Surgeon: Sherrilyn Rist, MD;  Location: [facility] ENDOSCOPY;  Service: Gastroenterology;;   STUMP REVISION Left 06/27/2021   Procedure: REVISION LEFT BELOW KNEE AMPUTATION;  Surgeon: Nadara Mustard, MD;  Location: [facility] OR;  Service: Orthopedics;  Laterality: Left;   SUBMUCOSAL LIFTING INJECTION  06/22/2022   Procedure: SUBMUCOSAL LIFTING INJECTION;  Surgeon: Meridee Score Netty Starring., MD;  Location: [facility] ENDOSCOPY;  Service: Gastroenterology;;   SUBMUCOSAL TATTOO INJECTION  06/22/2022   Procedure: SUBMUCOSAL TATTOO INJECTION;  Surgeon: Lemar Lofty., MD;  Location: [facility] ENDOSCOPY;  Service: Gastroenterology;;   TRANSURETHRAL RESECTION OF PROSTATE N/A 06/28/2020   Procedure: TRANSURETHRAL RESECTION OF THE PROSTATE (TURP);  Surgeon: Marcine Matar, MD;  Location: [facility] OR;  Service: Urology;  Laterality: N/A;   WISDOM TOOTH EXTRACTION     Patient Active Problem List   Diagnosis Date Noted   Right below-knee amputee (HCC) 07/29/2022   Severe sepsis (HCC) 07/23/2022   Foot infection 07/22/2022   Acute respiratory failure (HCC) 07/22/2022   Anemia due to chronic blood loss 06/23/2022   Heme positive stool 06/23/2022   Benign neoplasm of cecum 06/23/2022   Benign neoplasm of transverse colon 06/23/2022   Benign neoplasm of descending colon 06/23/2022   Abscess of right foot 05/17/2022   Contraindication to anticoagulation therapy GU Bleed 03/13/2022   Paroxysmal atrial fibrillation (HCC) 08/18/2021   Anemia of chronic renal failure 08/18/2021   Thrombocytopenia (HCC) 08/18/2021   Acute metabolic encephalopathy 08/18/2021   Action induced myoclonus 08/18/2021   Left below-knee amputee (HCC) 05/11/2021    Symptomatic anemia 09/13/2020   COVID-19 virus infection 09/13/2020   Pressure injury of skin 06/29/2020   Gross hematuria 06/16/2020   Typical atrial flutter (HCC) 03/24/2020   Bilateral pleural effusion 02/24/2020   Healthcare maintenance 01/28/2020   Shortness of breath 01/28/2020   History of partial ray amputation of fourth toe of right foot (HCC) 09/08/2019   Chronic cough 06/25/2019   Cutaneous abscess of right foot    Subacute osteomyelitis, right ankle and foot (HCC) 12/14/2018   AKI (acute kidney injury) (HCC) 12/14/2018   ESRD on dialysis (HCC) 12/14/2018   S/P CABG (coronary artery bypass graft) 12/11/2018   Gait abnormality 08/24/2018   Diabetic neuropathy (HCC) 02/06/2018   Cervical myelopathy (HCC) 02/06/2018   Onychomycosis 10/30/2017   Other spondylosis with radiculopathy, cervical region 01/27/2017   Midfoot ulcer, right, limited to breakdown of skin (HCC) 11/15/2016   Lateral epicondylitis, left elbow 08/12/2016   Prostate cancer (HCC) 06/19/2016   Cellulitis of right foot 12/24/2015   Gout 09/14/2012   Hypertension associated with diabetes (HCC) 09/14/2012   Hypothyroidism 09/14/2012   CAD (coronary artery disease) 09/14/2012   Insulin dependent type 2 diabetes mellitus (HCC) 09/14/2012   Hyperlipidemia associated with type 2 diabetes mellitus (HCC)    Neuropathy (HCC)     ONSET DATE: 10/31/2022 right BKA prosthesis delivery  REFERRING DIAG:  Z61.096E (ICD-10-CM) - Below-knee amputation of left lower extremity, initial encounter (HCC)  A54.098J (ICD-10-CM) - Below-knee amputation of right lower extremity, initial encounter (HCC)   THERAPY DIAG:  Unsteadiness on feet  Other abnormalities of gait and mobility  Muscle weakness (generalized)  Abnormal posture  History of falling  Impaired functional mobility, balance, gait, and endurance  Rationale for Evaluation and Treatment: Rehabilitation  SUBJECTIVE:   SUBJECTIVE STATEMENT:   He let eye  surgeon know about sacral wound and they are continuing with 2nd cataract surgery on  10/7.   PERTINENT HISTORY: ESRD on home dialysis, HTN, HLD, CAD, DM2, PVD, prostate CA, cervical myelopathy,    PAIN:  Are you having pain? No  PRECAUTIONS: Fall and Other: No BP LUE  WEIGHT BEARING RESTRICTIONS: No  FALLS: Has patient fallen in last 6 months? Yes. Number of falls 1 his left prosthesis slipped off & denies injuries.   LIVING ENVIRONMENT: Lives with: lives with their spouse and CNA Mon-Fri 1:00-5:00 Lives in: House  Home Access: Ramped entrance Home layout:  3 level enters on  middle floor which has bedroom & full bath. Stairs: downstairs has chair lift.  Has following equipment at home: Single point cane, Walker - 2 wheeled, Environmental consultant - 4 wheeled, Wheelchair (manual), [employer], Grab bars, and Ramped entry  OCCUPATION: retired  PLOF: he was walking with RW with left prosthesis.    PATIENT GOALS:   to use 2 prostheses to walk with cane. He wants to bring garbage cans up sloped driveway.    OBJECTIVE:  COGNITION: Eval on 11/06/2022:  Overall cognitive status: Within functional limits for tasks assessed  POSTURE: Eval on 11/06/2022: rounded shoulders, forward head, flexed trunk , and weight shift left  LOWER EXTREMITY ROM:  ROM P:passive  A:active Right eval  Hip flexion   Hip extension   Hip abduction   Hip adduction   Hip internal rotation   Hip external rotation   Knee flexion   Knee extension seated P: -6*  Ankle dorsiflexion   Ankle plantarflexion   Ankle inversion   Ankle eversion    (Blank rows = not tested)  LOWER EXTREMITY MMT:  MMT Right eval Left eval Rt/Lt 12/23/22  Hip flexion 4/5 4/5 4+/4+  Hip extension 3/5 3+5 4/4  Hip abduction 35 3+5 4/4  Hip adduction     Hip internal rotation     Hip external rotation     Knee flexion 3+/5 4/5 4/4  Knee extension 3+/5 4/5 4+/4+  Eval MMT gross seated & functional  (Blank rows = not  tested)  TRANSFERS: Eval on 11/06/2022: Sit to stand: SBA 22" w/c using BUEs on armrests requires RW for stabilization Stand to sit: SBA RW for stabilization to 22" w/c using BUEs on armrests  GAIT: 12/16/2022: Pt amb 101' (max tolerable distance) with rollator walker with supervision / verbal cues on posture including position within walker.    Eval on 11/06/2022: Gait pattern: step to pattern, decreased step length- Left, decreased stance time- Right, Right hip hike, Left hip hike, knee flexed in stance- Right, lateral hip instability, trunk flexed, and wide BOS Distance walked: 25' turning 180* CW & CCW Assistive device utilized: Environmental consultant - 2 wheeled and bilateral TTA prostheses Level of assistance: Min A  FUNCTIONAL TESTs:  Eval on 11/06/2022: Standing balance with RW support with close supervision: reaches 5" anteriorly, looks to side only when looking over shoulders and reaches towards floor within 10".    CURRENT PROSTHETIC WEAR ASSESSMENT: 11/06/2022:  Patient is independent with: prosthetic cleaning and ply sock cleaning Patient is dependent with: skin check, residual limb care, proper wear schedule/adjustment, and proper weight-bearing schedule/adjustment Donning prosthesis: SBA / verbal cues Doffing prosthesis: Modified independence Prosthetic wear tolerance: pt reports wearing left prosthesis daily all awake hours for several months.  Right prosthesis has worn daily for 6 days since receiving it for most of awake hours.  Prosthetic weight bearing tolerance: 5 minutes with no c/o pain Edema: right limb has pitting edema with 5 sec capillary refill.  Left limb has no edema noted. Residual limb condition: Left limb has superficial wound 5mm with white perimeter from excessive fluid & CNA reports some drainage;  Right limb has no open areas, distal area has redness & shiny skin, normal temperature, no hair growth.  Prosthetic description: silicon liners (9mm over tibia & 6mm  posterior portion) with pin lock suspension, socket with ratchet suspension, flexible Keel feet     TODAY'S TREATMENT:  DATE:  01/08/2023: Prosthetic Training with Bilateral Transtibial Prostheses:  Pt questioning right limb skin integrity. No open areas but dry flaking skin over previous wounds on posterior limb. PT recommended using lotion on limb at night & removing in morning. Pt & wife verbalized understanding.  Pt amb 160' with rollator walker with supervision / verbal cues on upright trunk.   PT cued on stand to sit orienting feet to chair and controlled motion to minimize risk of compression fractures and torsion fx of hip.  PT reviewed with patient and wife on walking program with increased frequency short (within home), long (max tolerable distance) and medium distances entering / exiting community buildings. Pt & wife verbalized understanding.  Pt amb 50' and neg 3 steps side stepping with right rail (ascend) with minA.  Pt's LEs fatigued requiring seated rest on steps.    01/06/2023: Prosthetic Training with Bilateral Transtibial Prostheses:  PT discussed need to disclose sacral wound to eye doctor prior to surgery. PT recommended sidelying at least 1 hour 2x/day.  Pt & wife verbalized understanding of PT recommendation PT reviewed with demo & verbal cues on rotation of socket, need to tighten straps to control depth of limb into socket and positioning in sitting. Pt & wife verbalized understanding.  PT reviewed need to progress activities for time & functional strength needed.  Pt wants to walk to his seat at [facility] which is long distance & steps with single rail available.  Pt's wife verbalized understanding of PT concerns for attempting with large increase in distance and steps. Pt amb with rollator walker 17' with CGA and neg 4" curb (1 rep leading LLE &  1 rep leading RLE) with MinA / verbal cues for technique.  PT demo & verbal cues on rationale for ~1/4 foot of stance LE over edge to descend steps which allows smoother knee flexion.  Pt neg 6" curb with minA & verbal cues.     01/02/2023: Prosthetic Training with Bilateral Transtibial Prostheses:  Pt amb 30' X 2 with rollator and CGA Pt amb 75 feet X 2  with rollator X 2 with CGA Pt navigated 6.5 inch step with rollator and min A, cues and demo for techinque   HOME EXERCISE PROGRAM: Access Code: [email or web address] URL: [email or web address] Date: 12/16/2022 Prepared by: Vladimir Faster  Exercises - Seated Hamstring Stretch with Strap  - 1 x daily - 7 x weekly - 1 sets - 2-3 reps - 30 seconds hold - standing calf stretch with forefoot on small step or brick  - 1 x daily - 7 x weekly - 1 sets - 2-3 reps - 30 seconds hold   Increasing your activity level is important.  Short distances which is walking from one room to another. Work to increase frequency back to prior level. Start with walk each hour during awake hours, then every 45-50 min, decreasing until he is moving some every 30 min during day   Medium distances are entering & exiting your home or community with limited distances. Start with 4 medium walks which is one outing to one location and increase number of tolerated amounts per day.  Long distance is your highest tolerance for you. Walk until you feel you must rest. Back or leg pain or general fatigue are indicators to maximum tolerance. Monitor by distance or time. Try to walk your BEST distance 1-2 times per day. You should see this increase over time.      ASSESSMENT:  CLINICAL IMPRESSION: Patient appears to  understand PT recommendations to improve activity tolerance.  He required only supervision for level surface gait with rollator walker today.    OBJECTIVE IMPAIRMENTS: Abnormal gait, decreased activity tolerance, decreased balance, decreased endurance,  decreased knowledge of use of DME, decreased mobility, difficulty walking, decreased ROM, decreased strength, increased edema, postural dysfunction, and prosthetic dependency .   ACTIVITY LIMITATIONS: carrying, standing, stairs, transfers, locomotion level, and standing ADLs  PARTICIPATION LIMITATIONS: community activity and household mobility  PERSONAL FACTORS: Age, Fitness, Past/current experiences, Time since onset of injury/illness/exacerbation, and 3+ comorbidities: see PMH  are also affecting patient's functional outcome.   REHAB POTENTIAL: Good  CLINICAL DECISION MAKING: Evolving/moderate complexity  EVALUATION COMPLEXITY: Moderate   GOALS: Goals reviewed with patient? Yes  SHORT TERM GOALS: Target date: 01/02/2023  Patient verbalizes proper skin care & checking limbs.  Baseline: SEE OBJECTIVE DATA Goal status: MET 12/23/22 2.  Patient tolerates prostheses >12 hrs total /day without skin issues or limb pain Baseline: SEE OBJECTIVE DATA Goal status: MET 12/23/22, he reports he wears them all day long  3.  Patient able to reach 10" and look over both shoulders with single UE support on RW safely. Baseline: SEE OBJECTIVE DATA Goal status: MET 12/23/22, was able to reach 11 inches  4. Patient ambulates 200' with RW & prostheses with supervision. Baseline: SEE OBJECTIVE DATA Goal status: Ongoing 12/23/2022, 150 feet was max tolerated distance today  5. Patient negotiates ramps & curbs with RW & prostheses with minA. Baseline: SEE OBJECTIVE DATA Goal status: MET 12/23/22  LONG TERM GOALS: Target date: 01/30/2023  Patient demonstrates & verbalized understanding of prosthetic care to enable safe utilization of prosthesis. Baseline: SEE OBJECTIVE DATA Goal status:   Ongoing 12/11/2022  Patient tolerates prostheses wear >90% of awake hours without skin or limb pain issues. Baseline: SEE OBJECTIVE DATA Goal status: Ongoing 12/11/2022  Tasks of [employer] with RW support >/=  30/56 Baseline: SEE OBJECTIVE DATA Goal status: Ongoing 12/11/2022  Patient ambulates >300' with prosthesis & LRAD independently Baseline: SEE OBJECTIVE DATA Goal status: Ongoing 12/11/2022  Patient negotiates ramps, curbs & stairs with single rail with prostheses & LRAD modified independent. Baseline: SEE OBJECTIVE DATA Goal status: Ongoing 12/11/2022  Patient verbalizes & demonstrates understanding of how to properly use fitness equipment to return to gym. Baseline: SEE OBJECTIVE DATA Goal status:  Ongoing 12/11/2022  PLAN:  PT FREQUENCY: 2x/week  PT DURATION: 12 weeks  PLANNED INTERVENTIONS: Therapeutic exercises, Therapeutic activity, Neuromuscular re-education, Balance training, Gait training, Patient/Family education, Self Care, Stair training, Prosthetic training, DME instructions, Aquatic Therapy, Re-evaluation, and physical performance testing  PLAN FOR NEXT SESSION: ask how 2nd cataract surgery went, Work towards LTGs,  continue prosthetic gait with rollator walker. Balance activities and functional exercises to promote an upright trunk. Strengthening especially core & LEs    Vladimir Faster, PT, DPT 01/08/2023, 1:57 PM

## 2023-01-16 ENCOUNTER — Ambulatory Visit (INDEPENDENT_AMBULATORY_CARE_PROVIDER_SITE_OTHER): Payer: Medicare Other | Admitting: Physical Therapy

## 2023-01-16 ENCOUNTER — Ambulatory Visit: Payer: Medicare Other | Admitting: Orthopedic Surgery

## 2023-01-16 ENCOUNTER — Encounter: Payer: Self-pay | Admitting: Physical Therapy

## 2023-01-16 ENCOUNTER — Encounter: Payer: Self-pay | Admitting: Orthopedic Surgery

## 2023-01-16 ENCOUNTER — Telehealth: Payer: Self-pay | Admitting: Orthopedic Surgery

## 2023-01-16 DIAGNOSIS — M6281 Muscle weakness (generalized): Secondary | ICD-10-CM | POA: Diagnosis not present

## 2023-01-16 DIAGNOSIS — S88111A Complete traumatic amputation at level between knee and ankle, right lower leg, initial encounter: Secondary | ICD-10-CM

## 2023-01-16 DIAGNOSIS — R2689 Other abnormalities of gait and mobility: Secondary | ICD-10-CM | POA: Diagnosis not present

## 2023-01-16 DIAGNOSIS — R2681 Unsteadiness on feet: Secondary | ICD-10-CM

## 2023-01-16 DIAGNOSIS — Z89512 Acquired absence of left leg below knee: Secondary | ICD-10-CM | POA: Diagnosis not present

## 2023-01-16 DIAGNOSIS — S88112A Complete traumatic amputation at level between knee and ankle, left lower leg, initial encounter: Secondary | ICD-10-CM

## 2023-01-16 DIAGNOSIS — Z89511 Acquired absence of right leg below knee: Secondary | ICD-10-CM

## 2023-01-16 DIAGNOSIS — R293 Abnormal posture: Secondary | ICD-10-CM

## 2023-01-16 DIAGNOSIS — Z7409 Other reduced mobility: Secondary | ICD-10-CM

## 2023-01-16 DIAGNOSIS — Z9181 History of falling: Secondary | ICD-10-CM

## 2023-01-16 MED ORDER — TIZANIDINE HCL 2 MG PO TABS: 2.0000 mg | ORAL_TABLET | Freq: Two times a day (BID) | ORAL | 0 refills | Status: DC | PRN

## 2023-01-16 NOTE — Telephone Encounter (Signed)
Patient called and said he needs a script 2 left silicone locking liners.  Goes to [facility] Fax# [phone]. CB#[phone]

## 2023-01-16 NOTE — Progress Notes (Signed)
Office Visit Note   Patient: [name]           Date of Birth: 25-Jun-1950           MRN: [id number] Visit Date: 01/16/2023              Requested by: Adolph Pollack, FNP [id number] [place],  [place] [address] PCP: Adolph Pollack, FNP  Chief Complaint  Patient presents with   Right Leg - Follow-up    Hx right BKA      HPI: Patient is a 72 year old gentleman who is status post bilateral transtibial amputations.  Patient has custom sockets that are adjustable.  Patient complains of increased pressure on the patella of the right knee secondary to subsidence.  Assessment & Plan: Visit Diagnoses:  1. Below-knee amputation of left lower extremity, initial encounter (HCC)   2. Below-knee amputation of right lower extremity, initial encounter [facility])     Plan: Prescription was called in for his tizanidine.  Patient will follow-up with [facility] today to adjust the suspension in the socket right below-knee amputation.  Follow-Up Instructions: No follow-ups on file.   Ortho Exam  Patient is alert, oriented, no adenopathy, well-dressed, normal affect, normal respiratory effort. Examination patient is subsiding into the socket on the right.  He was applying pressure over the patella with some redness but no open ulcers or wounds.  The end of the residual limb has no ulcers.  Imaging: No results found. No images are attached to the encounter.  Labs: Lab Results  Component Value Date   HGBA1C 5.0 05/17/2022   HGBA1C 5.5 05/04/2021   HGBA1C 6.3 (H) 02/02/2021   ESRSEDRATE 34 (H) 01/24/2020   ESRSEDRATE 41 (H) 01/10/2020   ESRSEDRATE 55 (H) 12/27/2019   CRP 8.8 (H) 01/24/2020   CRP 35.9 (H) 01/10/2020   CRP 77.9 (H) 12/27/2019   REPTSTATUS 10/15/2022 FINAL 10/11/2022   GRAMSTAIN  10/11/2022    FEW WBC PRESENT, PREDOMINANTLY MONONUCLEAR NO ORGANISMS SEEN    CULT  10/11/2022    NO GROWTH 3 DAYS Performed at [facility], [address],  [place], [place] [address]    LABORGA PROTEUS MIRABILIS 05/17/2022   LABORGA ENTEROCOCCUS FAECALIS 05/17/2022   LABORGA STAPHYLOCOCCUS AUREUS 05/17/2022     Lab Results  Component Value Date   ALBUMIN 4.2 10/11/2022   ALBUMIN 4.1 09/09/2022   ALBUMIN 3.4 (L) 08/11/2022   PREALBUMIN 32.7 05/09/2021   PREALBUMIN 14.8 (L) 12/14/2018    Lab Results  Component Value Date   MG 2.0 07/25/2022   MG 1.6 (L) 07/23/2022   MG 2.4 05/14/2021   Lab Results  Component Value Date   VD25OH 42.0 11/19/2021   VD25OH 16.13 (L) 05/09/2021    Lab Results  Component Value Date   PREALBUMIN 32.7 05/09/2021   PREALBUMIN 14.8 (L) 12/14/2018      Latest Ref Rng & Units 10/11/2022   12:36 PM 09/09/2022   11:18 AM 08/10/2022    1:00 PM  CBC EXTENDED  WBC 4.0 - 10.5 K/uL 4.9  4.7  6.7   RBC 4.22 - 5.81 Mil/uL 3.33  3.10  3.11   Hemoglobin 13.0 - 17.0 g/dL 9.7  9.3  9.1   HCT 28.4 - 52.0 % 30.0  28.0  29.5   Platelets 150.0 - 400.0 K/uL 75.0  78.0  127   NEUT# 1.4 - 7.7 K/uL 4.1  3.7    Lymph# 0.7 - 4.0 K/uL  0.3  0.3       There is no height or weight on file to calculate BMI.  Orders:  No orders of the defined types were placed in this encounter.  Meds ordered this encounter  Medications   tiZANidine (ZANAFLEX) 2 MG tablet    Sig: Take 1 tablet (2 mg total) by mouth 2 (two) times daily as needed for muscle spasms.    Dispense:  30 tablet    Refill:  0     Procedures: No procedures performed  Clinical Data: No additional findings.  ROS:  All other systems negative, except as noted in the HPI. Review of Systems  Objective: Vital Signs: There were no vitals taken for this visit.  Specialty Comments:  No specialty comments available.  PMFS History: Patient Active Problem List   Diagnosis Date Noted   Right below-knee amputee (HCC) 07/29/2022   Severe sepsis (HCC) 07/23/2022   Foot infection 07/22/2022   Acute respiratory failure (HCC) 07/22/2022   Anemia due to chronic blood  loss 06/23/2022   Heme positive stool 06/23/2022   Benign neoplasm of cecum 06/23/2022   Benign neoplasm of transverse colon 06/23/2022   Benign neoplasm of descending colon 06/23/2022   Abscess of right foot 05/17/2022   Contraindication to anticoagulation therapy GU Bleed 03/13/2022   Paroxysmal atrial fibrillation (HCC) 08/18/2021   Anemia of chronic renal failure 08/18/2021   Thrombocytopenia (HCC) 08/18/2021   Acute metabolic encephalopathy 08/18/2021   Action induced myoclonus 08/18/2021   Left below-knee amputee (HCC) 05/11/2021   Symptomatic anemia 09/13/2020   COVID-19 virus infection 09/13/2020   Pressure injury of skin 06/29/2020   Gross hematuria 06/16/2020   Typical atrial flutter (HCC) 03/24/2020   Bilateral pleural effusion 02/24/2020   Healthcare maintenance 01/28/2020   Shortness of breath 01/28/2020   History of partial ray amputation of fourth toe of right foot (HCC) 09/08/2019   Chronic cough 06/25/2019   Cutaneous abscess of right foot    Subacute osteomyelitis, right ankle and foot (HCC) 12/14/2018   AKI (acute kidney injury) (HCC) 12/14/2018   ESRD on dialysis (HCC) 12/14/2018   S/P CABG (coronary artery bypass graft) 12/11/2018   Gait abnormality 08/24/2018   Diabetic neuropathy (HCC) 02/06/2018   Cervical myelopathy (HCC) 02/06/2018   Onychomycosis 10/30/2017   Other spondylosis with radiculopathy, cervical region 01/27/2017   Midfoot ulcer, right, limited to breakdown of skin (HCC) 11/15/2016   Lateral epicondylitis, left elbow 08/12/2016   Prostate cancer (HCC) 06/19/2016   Cellulitis of right foot 12/24/2015   Gout 09/14/2012   Hypertension associated with diabetes (HCC) 09/14/2012   Hypothyroidism 09/14/2012   CAD (coronary artery disease) 09/14/2012   Insulin dependent type 2 diabetes mellitus (HCC) 09/14/2012   Hyperlipidemia associated with type 2 diabetes mellitus (HCC)    Neuropathy (HCC)    Past Medical History:  Diagnosis Date   Acute  osteomyelitis of metatarsal bone of left foot (HCC)    Ambulates with cane    straight cane   Anemia    Cervical myelopathy (HCC) 02/06/2018   Chronic kidney disease    dailysis [facility]- home   Community acquired pneumonia of left lower lobe of lung 08/18/2021   Complication of anesthesia    Coronary artery disease    Dehiscence of amputation stump of left lower extremity (HCC)    Diabetes mellitus without complication (HCC)    type2   Diabetic foot ulcer (HCC)    Diabetic neuropathy (HCC) 02/06/2018   Dysrhythmia  Gait abnormality 08/24/2018   GERD (gastroesophageal reflux disease)     01/06/20- not current   Gout    History of blood transfusion    History of blood transfusion    History of kidney stones    passed stones   Hypercholesteremia    Hypertension    Hypothyroidism    Neuromuscular disorder (HCC)    neuropathy left leg and bilateral feet   Neuropathy    Partial nontraumatic amputation of foot, 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   PONV (postoperative nausea and vomiting)    Prostate cancer (HCC)    PVD (peripheral vascular disease) (HCC)    with amputations    Family History  Problem Relation Age of Onset   Diabetes Mellitus II Mother    Kidney disease Mother    Diabetes Mellitus II Father    CAD Father    Cancer Father        prostate   Kidney disease Father    Diabetes Mellitus II Brother    Kidney disease Brother    Diabetes Mellitus II Brother    Stomach cancer Brother 41   Kidney disease Brother    Colon cancer Neg Hx    Colon polyps Neg Hx    Esophageal cancer Neg Hx    Rectal cancer Neg Hx    Pancreatic cancer Neg Hx     Past Surgical History:  Procedure Laterality Date   A-FLUTTER ABLATION N/A 04/06/2020   Procedure: A-FLUTTER ABLATION;  Surgeon: Marinus Maw, MD;  Location: [facility] INVASIVE CV LAB;  Service: Cardiovascular;  Laterality: N/A;   AMPUTATION Left 12/25/2013   Procedure: AMPUTATION RAY LEFT 5TH RAY;  Surgeon: Nadara Mustard, MD;  Location: [facility]  ORS;  Service: Orthopedics;  Laterality: Left;   AMPUTATION Right 12/15/2018   Procedure: AMPUTATION OF 4TH AND 5TH TOES RIGHT FOOT;  Surgeon: Nadara Mustard, MD;  Location: [facility] OR;  Service: Orthopedics;  Laterality: Right;   AMPUTATION Left 02/02/2021   Procedure: LEFT FOOT 4TH RAY AMPUTATION;  Surgeon: Nadara Mustard, MD;  Location: [facility] OR;  Service: Orthopedics;  Laterality: Left;   AMPUTATION Left 05/09/2021   Procedure: LEFT BELOW KNEE AMPUTATION;  Surgeon: Nadara Mustard, MD;  Location: [facility] OR;  Service: Orthopedics;  Laterality: Left;   AMPUTATION Right 07/26/2022   Procedure: RIGHT BELOW KNEE AMPUTATION;  Surgeon: Nadara Mustard, MD;  Location: [facility] OR;  Service: Orthopedics;  Laterality: Right;   APPLICATION OF WOUND VAC Right 12/15/2018   Procedure: APPLICATION OF WOUND VAC;  Surgeon: Nadara Mustard, MD;  Location: [facility] OR;  Service: Orthopedics;  Laterality: Right;   AV FISTULA PLACEMENT Left 03/25/2019   Procedure: LEFT ARM ARTERIOVENOUS (AV) FISTULA CREATION;  Surgeon: Nada Libman, MD;  Location: [facility] OR;  Service: Vascular;  Laterality: Left;   BACK SURGERY     BASCILIC VEIN TRANSPOSITION Left 05/20/2019   Procedure: SECOND STAGE LEFT BASCILIC VEIN TRANSPOSITION;  Surgeon: Nada Libman, MD;  Location: [facility] OR;  Service: Vascular;  Laterality: Left;   BIOPSY  06/22/2022   Procedure: BIOPSY;  Surgeon: Lemar Lofty., MD;  Location: [facility] ENDOSCOPY;  Service: Gastroenterology;;   CARDIAC CATHETERIZATION  02/17/2014   CHOLECYSTECTOMY     COLONOSCOPY  2011   in [place], Normal   COLONOSCOPY N/A 06/23/2022   Procedure: COLONOSCOPY;  Surgeon: Sherrilyn Rist, MD;  Location: [facility] ENDOSCOPY;  Service: Gastroenterology;  Laterality: N/A;   CORONARY ARTERY BYPASS GRAFT  2008   CYSTOSCOPY  N/A 06/29/2020   Procedure: CYSTOSCOPY WITH CLOT EVACUATION AND  FULGERATION;  Surgeon: Marcine Matar, MD;  Location: [facility] OR;  Service: Urology;  Laterality: N/A;   CYSTOSCOPY WITH FULGERATION  Bilateral 06/17/2020   Procedure: CYSTOSCOPY,BILATERAL RETROGRADE, CLOT EVACUATION WITH FULGERATION OF THE BLADDER;  Surgeon: Jannifer Hick, MD;  Location: [facility] OR;  Service: Urology;  Laterality: Bilateral;   CYSTOSCOPY WITH FULGERATION N/A 06/28/2020   Procedure: CYSTOSCOPY WITH CLOT EVACUATION AND FULGERATION OF BLEEDERS;  Surgeon: Marcine Matar, MD;  Location: [facility] OR;  Service: Urology;  Laterality: N/A;  1 HR   ENTEROSCOPY N/A 06/22/2022   Procedure: ENTEROSCOPY;  Surgeon: Meridee Score Netty Starring., MD;  Location: [facility] ENDOSCOPY;  Service: Gastroenterology;  Laterality: N/A;   HEMOSTASIS CLIP PLACEMENT  06/22/2022   Procedure: HEMOSTASIS CLIP PLACEMENT;  Surgeon: Lemar Lofty., MD;  Location: [facility] ENDOSCOPY;  Service: Gastroenterology;;   HOT HEMOSTASIS N/A 06/22/2022   Procedure: HOT HEMOSTASIS (ARGON PLASMA COAGULATION/BICAP);  Surgeon: Lemar Lofty., MD;  Location: [facility] ENDOSCOPY;  Service: Gastroenterology;  Laterality: N/A;   I & D EXTREMITY Right 12/15/2018   Procedure: DEBRIDEMENT RIGHT FOOT;  Surgeon: Nadara Mustard, MD;  Location: [facility] OR;  Service: Orthopedics;  Laterality: Right;   I & D EXTREMITY Right 08/27/2019   Procedure: PARTIAL CUBOID EXCISION RIGHT FOOT;  Surgeon: Nadara Mustard, MD;  Location: [facility] OR;  Service: Orthopedics;  Laterality: Right;   I & D EXTREMITY Right 01/07/2020   Procedure: RIGHT FOOT EXCISION INFECTED BONE;  Surgeon: Nadara Mustard, MD;  Location: [facility] OR;  Service: Orthopedics;  Laterality: Right;   I & D EXTREMITY Right 05/17/2022   Procedure: IRRIGATION AND DEBRIDEMENT RIGHT FOOT ABSCESS;  Surgeon: Nadara Mustard, MD;  Location: [facility] OR;  Service: Orthopedics;  Laterality: Right;   IR PARACENTESIS  10/11/2022   IR PARACENTESIS  12/24/2022   NECK SURGERY     novemver 2019   POLYPECTOMY  06/22/2022   Procedure: POLYPECTOMY;  Surgeon: Meridee Score Netty Starring., MD;  Location: [facility] ENDOSCOPY;  Service: Gastroenterology;;   POLYPECTOMY  06/23/2022   Procedure:  POLYPECTOMY;  Surgeon: Sherrilyn Rist, MD;  Location: [facility] ENDOSCOPY;  Service: Gastroenterology;;   STUMP REVISION Left 06/27/2021   Procedure: REVISION LEFT BELOW KNEE AMPUTATION;  Surgeon: Nadara Mustard, MD;  Location: [facility] OR;  Service: Orthopedics;  Laterality: Left;   SUBMUCOSAL LIFTING INJECTION  06/22/2022   Procedure: SUBMUCOSAL LIFTING INJECTION;  Surgeon: Meridee Score Netty Starring., MD;  Location: [facility] ENDOSCOPY;  Service: Gastroenterology;;   SUBMUCOSAL TATTOO INJECTION  06/22/2022   Procedure: SUBMUCOSAL TATTOO INJECTION;  Surgeon: Lemar Lofty., MD;  Location: [facility] ENDOSCOPY;  Service: Gastroenterology;;   TRANSURETHRAL RESECTION OF PROSTATE N/A 06/28/2020   Procedure: TRANSURETHRAL RESECTION OF THE PROSTATE (TURP);  Surgeon: Marcine Matar, MD;  Location: [facility] OR;  Service: Urology;  Laterality: N/A;   WISDOM TOOTH EXTRACTION     Social History   Occupational History   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  Tobacco Use   Smoking status: Former    Types: Cigars    Start date: 04/08/1973    Quit date: 12/24/1988    Years since quitting: 34.0   Smokeless tobacco: Never   Tobacco comments:    Cigars and Pipe   Vaping Use   Vaping status: Never Used  Substance and Sexual Activity   Alcohol use: Never   Drug use: Never   Sexual activity: Yes    Partners: Female

## 2023-01-16 NOTE — Therapy (Addendum)
OUTPATIENT PHYSICAL THERAPY PROSTHETIC TREATMENT    Patient Name: [name] MRN: [id number] DOB:1950-05-27, 72 y.o., male Today's Date: 01/16/2023  PCP: Adolph Pollack, FNP REFERRING PROVIDER: Aldean Baker, MD  END OF SESSION:  PT End of Session - 01/16/23 1421     Visit Number 15    Number of Visits 25    Date for PT Re-Evaluation 01/30/23    Authorization Type Medicare & BCBS supplement    Progress Note Due on Visit 20    PT Start Time 1145    PT Stop Time 1230    PT Time Calculation (min) 45 min    Equipment Utilized During Treatment Gait belt    Activity Tolerance Patient tolerated treatment well    Behavior During Therapy WFL for tasks assessed/performed                        Past Medical History:  Diagnosis Date   Acute osteomyelitis of metatarsal bone of left foot (HCC)    Ambulates with cane    straight cane   Anemia    Cervical myelopathy (HCC) 02/06/2018   Chronic kidney disease    dailysis M W F- home   Community acquired pneumonia of left lower lobe of lung 08/18/2021   Complication of anesthesia    Coronary artery disease    Dehiscence of amputation stump of left lower extremity (HCC)    Diabetes mellitus without complication (HCC)    type2   Diabetic foot ulcer (HCC)    Diabetic neuropathy (HCC) 02/06/2018   Dysrhythmia    Gait abnormality 08/24/2018   GERD (gastroesophageal reflux disease)     01/06/20- not current   Gout    History of blood transfusion    History of blood transfusion    History of kidney stones    passed stones   Hypercholesteremia    Hypertension    Hypothyroidism    Neuromuscular disorder (HCC)    neuropathy left leg and bilateral feet   Neuropathy    Partial nontraumatic amputation of foot, 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   PONV (postoperative nausea and vomiting)    Prostate cancer (HCC)    PVD (peripheral vascular disease) (HCC)    with amputations   Past Surgical History:  Procedure Laterality Date    A-FLUTTER ABLATION N/A 04/06/2020   Procedure: A-FLUTTER ABLATION;  Surgeon: Marinus Maw, MD;  Location: [facility];  Service: Cardiovascular;  Laterality: N/A;   AMPUTATION Left 12/25/2013   Procedure: AMPUTATION RAY LEFT 5TH RAY;  Surgeon: Nadara Mustard, MD;  Location: [facility] ORS;  Service: Orthopedics;  Laterality: Left;   AMPUTATION Right 12/15/2018   Procedure: AMPUTATION OF 4TH AND 5TH TOES RIGHT FOOT;  Surgeon: Nadara Mustard, MD;  Location: [facility] OR;  Service: Orthopedics;  Laterality: Right;   AMPUTATION Left 02/02/2021   Procedure: LEFT FOOT 4TH RAY AMPUTATION;  Surgeon: Nadara Mustard, MD;  Location: [name] OR;  Service: Orthopedics;  Laterality: Left;   AMPUTATION Left 05/09/2021   Procedure: LEFT BELOW KNEE AMPUTATION;  Surgeon: Nadara Mustard, MD;  Location: [facility] OR;  Service: Orthopedics;  Laterality: Left;   AMPUTATION Right 07/26/2022   Procedure: RIGHT BELOW KNEE AMPUTATION;  Surgeon: Nadara Mustard, MD;  Location: [facility] OR;  Service: Orthopedics;  Laterality: Right;   APPLICATION OF WOUND VAC Right 12/15/2018   Procedure: APPLICATION OF WOUND VAC;  Surgeon: Nadara Mustard, MD;  Location: [facility] OR;  Service:  Orthopedics;  Laterality: Right;   AV FISTULA PLACEMENT Left 03/25/2019   Procedure: LEFT ARM ARTERIOVENOUS (AV) FISTULA CREATION;  Surgeon: Nada Libman, MD;  Location: [facility] OR;  Service: Vascular;  Laterality: Left;   BACK SURGERY     BASCILIC VEIN TRANSPOSITION Left 05/20/2019   Procedure: SECOND STAGE LEFT BASCILIC VEIN TRANSPOSITION;  Surgeon: Nada Libman, MD;  Location: [facility] OR;  Service: Vascular;  Laterality: Left;   BIOPSY  06/22/2022   Procedure: BIOPSY;  Surgeon: Lemar Lofty., MD;  Location: [facility] ENDOSCOPY;  Service: Gastroenterology;;   CARDIAC CATHETERIZATION  02/17/2014   CHOLECYSTECTOMY     COLONOSCOPY  2011   in [place], Normal   COLONOSCOPY N/A 06/23/2022   Procedure: COLONOSCOPY;  Surgeon: Sherrilyn Rist, MD;  Location: [facility] ENDOSCOPY;  Service:  Gastroenterology;  Laterality: N/A;   CORONARY ARTERY BYPASS GRAFT  2008   CYSTOSCOPY N/A 06/29/2020   Procedure: CYSTOSCOPY WITH CLOT EVACUATION AND  FULGERATION;  Surgeon: Marcine Matar, MD;  Location: [facility] OR;  Service: Urology;  Laterality: N/A;   CYSTOSCOPY WITH FULGERATION Bilateral 06/17/2020   Procedure: CYSTOSCOPY,BILATERAL RETROGRADE, CLOT EVACUATION WITH FULGERATION OF THE BLADDER;  Surgeon: Jannifer Hick, MD;  Location: [facility] OR;  Service: Urology;  Laterality: Bilateral;   CYSTOSCOPY WITH FULGERATION N/A 06/28/2020   Procedure: CYSTOSCOPY WITH CLOT EVACUATION AND FULGERATION OF BLEEDERS;  Surgeon: Marcine Matar, MD;  Location: [facility] OR;  Service: Urology;  Laterality: N/A;  1 HR   ENTEROSCOPY N/A 06/22/2022   Procedure: ENTEROSCOPY;  Surgeon: Meridee Score Netty Starring., MD;  Location: [facility] ENDOSCOPY;  Service: Gastroenterology;  Laterality: N/A;   HEMOSTASIS CLIP PLACEMENT  06/22/2022   Procedure: HEMOSTASIS CLIP PLACEMENT;  Surgeon: Lemar Lofty., MD;  Location: [facility] ENDOSCOPY;  Service: Gastroenterology;;   HOT HEMOSTASIS N/A 06/22/2022   Procedure: HOT HEMOSTASIS (ARGON PLASMA COAGULATION/BICAP);  Surgeon: Lemar Lofty., MD;  Location: [facility] ENDOSCOPY;  Service: Gastroenterology;  Laterality: N/A;   I & D EXTREMITY Right 12/15/2018   Procedure: DEBRIDEMENT RIGHT FOOT;  Surgeon: Nadara Mustard, MD;  Location: [facility] OR;  Service: Orthopedics;  Laterality: Right;   I & D EXTREMITY Right 08/27/2019   Procedure: PARTIAL CUBOID EXCISION RIGHT FOOT;  Surgeon: Nadara Mustard, MD;  Location: [facility] OR;  Service: Orthopedics;  Laterality: Right;   I & D EXTREMITY Right 01/07/2020   Procedure: RIGHT FOOT EXCISION INFECTED BONE;  Surgeon: Nadara Mustard, MD;  Location: [facility] OR;  Service: Orthopedics;  Laterality: Right;   I & D EXTREMITY Right 05/17/2022   Procedure: IRRIGATION AND DEBRIDEMENT RIGHT FOOT ABSCESS;  Surgeon: Nadara Mustard, MD;  Location: [facility] OR;  Service: Orthopedics;  Laterality:  Right;   IR PARACENTESIS  10/11/2022   IR PARACENTESIS  12/24/2022   NECK SURGERY     novemver 2019   POLYPECTOMY  06/22/2022   Procedure: POLYPECTOMY;  Surgeon: Meridee Score Netty Starring., MD;  Location: [facility] ENDOSCOPY;  Service: Gastroenterology;;   POLYPECTOMY  06/23/2022   Procedure: POLYPECTOMY;  Surgeon: Sherrilyn Rist, MD;  Location: [facility] ENDOSCOPY;  Service: Gastroenterology;;   STUMP REVISION Left 06/27/2021   Procedure: REVISION LEFT BELOW KNEE AMPUTATION;  Surgeon: Nadara Mustard, MD;  Location: [facility] OR;  Service: Orthopedics;  Laterality: Left;   SUBMUCOSAL LIFTING INJECTION  06/22/2022   Procedure: SUBMUCOSAL LIFTING INJECTION;  Surgeon: Meridee Score Netty Starring., MD;  Location: [facility] ENDOSCOPY;  Service: Gastroenterology;;   SUBMUCOSAL TATTOO INJECTION  06/22/2022   Procedure: SUBMUCOSAL TATTOO INJECTION;  Surgeon: Lemar Lofty., MD;  Location: [facility] ENDOSCOPY;  Service: Gastroenterology;;   TRANSURETHRAL RESECTION OF PROSTATE N/A 06/28/2020   Procedure: TRANSURETHRAL RESECTION OF THE PROSTATE (TURP);  Surgeon: Marcine Matar, MD;  Location: [facility] OR;  Service: Urology;  Laterality: N/A;   WISDOM TOOTH EXTRACTION     Patient Active Problem List   Diagnosis Date Noted   Right below-knee amputee (HCC) 07/29/2022   Severe sepsis (HCC) 07/23/2022   Foot infection 07/22/2022   Acute respiratory failure (HCC) 07/22/2022   Anemia due to chronic blood loss 06/23/2022   Heme positive stool 06/23/2022   Benign neoplasm of cecum 06/23/2022   Benign neoplasm of transverse colon 06/23/2022   Benign neoplasm of descending colon 06/23/2022   Abscess of right foot 05/17/2022   Contraindication to anticoagulation therapy GU Bleed 03/13/2022   Paroxysmal atrial fibrillation (HCC) 08/18/2021   Anemia of chronic renal failure 08/18/2021   Thrombocytopenia (HCC) 08/18/2021   Acute metabolic encephalopathy 08/18/2021   Action induced myoclonus 08/18/2021   Left below-knee amputee (HCC) 05/11/2021    Symptomatic anemia 09/13/2020   COVID-19 virus infection 09/13/2020   Pressure injury of skin 06/29/2020   Gross hematuria 06/16/2020   Typical atrial flutter (HCC) 03/24/2020   Bilateral pleural effusion 02/24/2020   Healthcare maintenance 01/28/2020   Shortness of breath 01/28/2020   History of partial ray amputation of fourth toe of right foot (HCC) 09/08/2019   Chronic cough 06/25/2019   Cutaneous abscess of right foot    Subacute osteomyelitis, right ankle and foot (HCC) 12/14/2018   AKI (acute kidney injury) (HCC) 12/14/2018   ESRD on dialysis (HCC) 12/14/2018   S/P CABG (coronary artery bypass graft) 12/11/2018   Gait abnormality 08/24/2018   Diabetic neuropathy (HCC) 02/06/2018   Cervical myelopathy (HCC) 02/06/2018   Onychomycosis 10/30/2017   Other spondylosis with radiculopathy, cervical region 01/27/2017   Midfoot ulcer, right, limited to breakdown of skin (HCC) 11/15/2016   Lateral epicondylitis, left elbow 08/12/2016   Prostate cancer (HCC) 06/19/2016   Cellulitis of right foot 12/24/2015   Gout 09/14/2012   Hypertension associated with diabetes (HCC) 09/14/2012   Hypothyroidism 09/14/2012   CAD (coronary artery disease) 09/14/2012   Insulin dependent type 2 diabetes mellitus (HCC) 09/14/2012   Hyperlipidemia associated with type 2 diabetes mellitus (HCC)    Neuropathy (HCC)     ONSET DATE: 10/31/2022 right BKA prosthesis delivery  REFERRING DIAG:  O96.295M (ICD-10-CM) - Below-knee amputation of left lower extremity, initial encounter (HCC)  W41.324M (ICD-10-CM) - Below-knee amputation of right lower extremity, initial encounter (HCC)   THERAPY DIAG:  Unsteadiness on feet  Other abnormalities of gait and mobility  Muscle weakness (generalized)  Abnormal posture  History of falling  Impaired functional mobility, balance, gait, and endurance  Rationale for Evaluation and Treatment: Rehabilitation  SUBJECTIVE:   SUBJECTIVE STATEMENT:   He relays he  is doing well after cataract surgery. He feels his prosthesis is rubbing his anterior knee. He will see prosthetist after PT and see if there is any revisions that need to be made  PERTINENT HISTORY: ESRD on home dialysis, HTN, HLD, CAD, DM2, PVD, prostate CA, cervical myelopathy,    PAIN:  Are you having pain? Yes 3-4/10 Rt anterior knee  PRECAUTIONS: Fall and Other: No BP LUE  WEIGHT BEARING RESTRICTIONS: No  FALLS: Has patient fallen in last 6 months? Yes. Number of falls 1 his left prosthesis slipped off & denies injuries.   LIVING ENVIRONMENT: Lives with: lives with their  spouse and CNA Mon-Fri 1:00-5:00 Lives in: House  Home Access: Ramped entrance Home layout:  3 level enters on middle floor which has bedroom & full bath. Stairs: downstairs has chair lift.  Has following equipment at home: Single point cane, Walker - 2 wheeled, Environmental consultant - 4 wheeled, Wheelchair (manual), [employer], Grab bars, and Ramped entry  OCCUPATION: retired  PLOF: he was walking with RW with left prosthesis.    PATIENT GOALS:   to use 2 prostheses to walk with cane. He wants to bring garbage cans up sloped driveway.    OBJECTIVE:  COGNITION: Eval on 11/06/2022:  Overall cognitive status: Within functional limits for tasks assessed  POSTURE: Eval on 11/06/2022: rounded shoulders, forward head, flexed trunk , and weight shift left  LOWER EXTREMITY ROM:  ROM P:passive  A:active Right eval  Hip flexion   Hip extension   Hip abduction   Hip adduction   Hip internal rotation   Hip external rotation   Knee flexion   Knee extension seated P: -6*  Ankle dorsiflexion   Ankle plantarflexion   Ankle inversion   Ankle eversion    (Blank rows = not tested)  LOWER EXTREMITY MMT:  MMT Right eval Left eval Rt/Lt 12/23/22  Hip flexion 4/5 4/5 4+/4+  Hip extension 3/5 3+5 4/4  Hip abduction 35 3+5 4/4  Hip adduction     Hip internal rotation     Hip external rotation     Knee flexion 3+/5 4/5  4/4  Knee extension 3+/5 4/5 4+/4+  Eval MMT gross seated & functional  (Blank rows = not tested)  TRANSFERS: Eval on 11/06/2022: Sit to stand: SBA 22" w/c using BUEs on armrests requires RW for stabilization Stand to sit: SBA RW for stabilization to 22" w/c using BUEs on armrests  GAIT: 12/16/2022: Pt amb 101' (max tolerable distance) with rollator walker with supervision / verbal cues on posture including position within walker.    Eval on 11/06/2022: Gait pattern: step to pattern, decreased step length- Left, decreased stance time- Right, Right hip hike, Left hip hike, knee flexed in stance- Right, lateral hip instability, trunk flexed, and wide BOS Distance walked: 25' turning 180* CW & CCW Assistive device utilized: Environmental consultant - 2 wheeled and bilateral TTA prostheses Level of assistance: Min A  FUNCTIONAL TESTs:  Eval on 11/06/2022: Standing balance with RW support with close supervision: reaches 5" anteriorly, looks to side only when looking over shoulders and reaches towards floor within 10".    CURRENT PROSTHETIC WEAR ASSESSMENT: 11/06/2022:  Patient is independent with: prosthetic cleaning and ply sock cleaning Patient is dependent with: skin check, residual limb care, proper wear schedule/adjustment, and proper weight-bearing schedule/adjustment Donning prosthesis: SBA / verbal cues Doffing prosthesis: Modified independence Prosthetic wear tolerance: pt reports wearing left prosthesis daily all awake hours for several months.  Right prosthesis has worn daily for 6 days since receiving it for most of awake hours.  Prosthetic weight bearing tolerance: 5 minutes with no c/o pain Edema: right limb has pitting edema with 5 sec capillary refill.  Left limb has no edema noted. Residual limb condition: Left limb has superficial wound 5mm with white perimeter from excessive fluid & CNA reports some drainage;  Right limb has no open areas, distal area has redness & shiny skin, normal  temperature, no hair growth.  Prosthetic description: silicon liners (9mm over tibia & 6mm posterior portion) with pin lock suspension, socket with ratchet suspension, flexible Keel feet  TODAY'S TREATMENT:                                                                                                                             DATE:  01/16/2023: Prosthetic Training with Bilateral Transtibial Prostheses:  Pt amb 152feets then 75 feet with rollator walker with supervision / verbal cues on upright trunk.   Step ups on 6 inch step with bilat UE support in bars X 3 reps leading with Rt leg, and 3 reps leading with left leg. Standing balance in //bars without UE support 30 sec X 4, intermittent UE touch down as needed Standing balance in //bars standing on foam 30 sec X 6 with feet together and one UE support, alternating which arm he is using to hold on Standing balance in //bars with unilateral UE rows green X 15 reps holding on to bar with other hand, performed 15 reps each side then did chest press X 15 each side   01/08/2023: Prosthetic Training with Bilateral Transtibial Prostheses:  Pt questioning right limb skin integrity. No open areas but dry flaking skin over previous wounds on posterior limb. PT recommended using lotion on limb at night & removing in morning. Pt & wife verbalized understanding.  Pt amb 160' with rollator walker with supervision / verbal cues on upright trunk.   PT cued on stand to sit orienting feet to chair and controlled motion to minimize risk of compression fractures and torsion fx of hip.  PT reviewed with patient and wife on walking program with increased frequency short (within home), long (max tolerable distance) and medium distances entering / exiting community buildings. Pt & wife verbalized understanding.  Pt amb 50' and neg 3 steps side stepping with right rail (ascend) with minA.  Pt's LEs fatigued requiring seated rest on steps.     01/06/2023: Prosthetic Training with Bilateral Transtibial Prostheses:  PT discussed need to disclose sacral wound to eye doctor prior to surgery. PT recommended sidelying at least 1 hour 2x/day.  Pt & wife verbalized understanding of PT recommendation PT reviewed with demo & verbal cues on rotation of socket, need to tighten straps to control depth of limb into socket and positioning in sitting. Pt & wife verbalized understanding.  PT reviewed need to progress activities for time & functional strength needed.  Pt wants to walk to his seat at [facility] which is long distance & steps with single rail available.  Pt's wife verbalized understanding of PT concerns for attempting with large increase in distance and steps. Pt amb with rollator walker 41' with CGA and neg 4" curb (1 rep leading LLE & 1 rep leading RLE) with MinA / verbal cues for technique.  PT demo & verbal cues on rationale for ~1/4 foot of stance LE over edge to descend steps which allows smoother knee flexion.  Pt neg 6" curb with minA & verbal cues.     01/02/2023: Prosthetic Training with Bilateral Transtibial Prostheses:  Pt amb 30' X 2 with rollator and CGA Pt amb 75 feet X 2  with rollator X 2 with CGA Pt navigated 6.5 inch step with rollator and min A, cues and demo for techinque   HOME EXERCISE PROGRAM: Access Code: [email or web address] URL: [email or web address] Date: 12/16/2022 Prepared by: Vladimir Faster  Exercises - Seated Hamstring Stretch with Strap  - 1 x daily - 7 x weekly - 1 sets - 2-3 reps - 30 seconds hold - standing calf stretch with forefoot on small step or brick  - 1 x daily - 7 x weekly - 1 sets - 2-3 reps - 30 seconds hold   Increasing your activity level is important.  Short distances which is walking from one room to another. Work to increase frequency back to prior level. Start with walk each hour during awake hours, then every 45-50 min, decreasing until he is moving some every 30 min  during day   Medium distances are entering & exiting your home or community with limited distances. Start with 4 medium walks which is one outing to one location and increase number of tolerated amounts per day.  Long distance is your highest tolerance for you. Walk until you feel you must rest. Back or leg pain or general fatigue are indicators to maximum tolerance. Monitor by distance or time. Try to walk your BEST distance 1-2 times per day. You should see this increase over time.      ASSESSMENT:  CLINICAL IMPRESSION: He is looking more steady with his rollator, he does want to work on his balance more so we did do that today within session and he will continue to benefit from PT for this.  OBJECTIVE IMPAIRMENTS: Abnormal gait, decreased activity tolerance, decreased balance, decreased endurance, decreased knowledge of use of DME, decreased mobility, difficulty walking, decreased ROM, decreased strength, increased edema, postural dysfunction, and prosthetic dependency .   ACTIVITY LIMITATIONS: carrying, standing, stairs, transfers, locomotion level, and standing ADLs  PARTICIPATION LIMITATIONS: community activity and household mobility  PERSONAL FACTORS: Age, Fitness, Past/current experiences, Time since onset of injury/illness/exacerbation, and 3+ comorbidities: see PMH  are also affecting patient's functional outcome.   REHAB POTENTIAL: Good  CLINICAL DECISION MAKING: Evolving/moderate complexity  EVALUATION COMPLEXITY: Moderate   GOALS: Goals reviewed with patient? Yes  SHORT TERM GOALS: Target date: 01/02/2023  Patient verbalizes proper skin care & checking limbs.  Baseline: SEE OBJECTIVE DATA Goal status: MET 12/23/22 2.  Patient tolerates prostheses >12 hrs total /day without skin issues or limb pain Baseline: SEE OBJECTIVE DATA Goal status: MET 12/23/22, he reports he wears them all day long  3.  Patient able to reach 10" and look over both shoulders with single UE  support on RW safely. Baseline: SEE OBJECTIVE DATA Goal status: MET 12/23/22, was able to reach 11 inches  4. Patient ambulates 200' with RW & prostheses with supervision. Baseline: SEE OBJECTIVE DATA Goal status: Ongoing 12/23/2022, 150 feet was max tolerated distance today  5. Patient negotiates ramps & curbs with RW & prostheses with minA. Baseline: SEE OBJECTIVE DATA Goal status: MET 12/23/22  LONG TERM GOALS: Target date: 01/30/2023  Patient demonstrates & verbalized understanding of prosthetic care to enable safe utilization of prosthesis. Baseline: SEE OBJECTIVE DATA Goal status:   Ongoing 12/11/2022  Patient tolerates prostheses wear >90% of awake hours without skin or limb pain issues. Baseline: SEE OBJECTIVE DATA Goal status: Ongoing 12/11/2022  Tasks of [employer] with RW support >/= 30/56 Baseline: SEE OBJECTIVE DATA Goal  status: Ongoing 12/11/2022  Patient ambulates >300' with prosthesis & LRAD independently Baseline: SEE OBJECTIVE DATA Goal status: Ongoing 12/11/2022  Patient negotiates ramps, curbs & stairs with single rail with prostheses & LRAD modified independent. Baseline: SEE OBJECTIVE DATA Goal status: Ongoing 12/11/2022  Patient verbalizes & demonstrates understanding of how to properly use fitness equipment to return to gym. Baseline: SEE OBJECTIVE DATA Goal status:  Ongoing 12/11/2022  PLAN:  PT FREQUENCY: 2x/week  PT DURATION: 12 weeks  PLANNED INTERVENTIONS: Therapeutic exercises, Therapeutic activity, Neuromuscular re-education, Balance training, Gait training, Patient/Family education, Self Care, Stair training, Prosthetic training, DME instructions, Aquatic Therapy, Re-evaluation, and physical performance testing  PLAN FOR NEXT SESSION:  Work towards LTGs,  continue prosthetic gait with rollator walker. Balance activities and functional exercises to promote an upright trunk. Strengthening especially core & LEs    April Manson, PT, DPT 01/16/2023,  2:22 PM

## 2023-01-16 NOTE — Telephone Encounter (Signed)
Pt was in office today can you please write.

## 2023-01-23 ENCOUNTER — Other Ambulatory Visit: Payer: Self-pay | Admitting: Orthopedic Surgery

## 2023-01-27 ENCOUNTER — Ambulatory Visit (INDEPENDENT_AMBULATORY_CARE_PROVIDER_SITE_OTHER): Payer: Medicare Other | Admitting: Physical Therapy

## 2023-01-27 ENCOUNTER — Encounter: Payer: Self-pay | Admitting: Physical Therapy

## 2023-01-27 DIAGNOSIS — M6281 Muscle weakness (generalized): Secondary | ICD-10-CM | POA: Diagnosis not present

## 2023-01-27 DIAGNOSIS — R2681 Unsteadiness on feet: Secondary | ICD-10-CM | POA: Diagnosis not present

## 2023-01-27 DIAGNOSIS — R293 Abnormal posture: Secondary | ICD-10-CM | POA: Diagnosis not present

## 2023-01-27 DIAGNOSIS — R2689 Other abnormalities of gait and mobility: Secondary | ICD-10-CM

## 2023-01-27 DIAGNOSIS — Z7409 Other reduced mobility: Secondary | ICD-10-CM

## 2023-01-27 DIAGNOSIS — Z9181 History of falling: Secondary | ICD-10-CM

## 2023-01-27 NOTE — Therapy (Signed)
OUTPATIENT PHYSICAL THERAPY PROSTHETIC TREATMENT    Patient Name: [name] MRN: [id number] DOB:February 08, 1951, 72 y.o., male Today's Date: 01/27/2023  PCP: Adolph Pollack, FNP REFERRING PROVIDER: Aldean Baker, MD  END OF SESSION:  PT End of Session - 01/27/23 1151     Visit Number 16    Number of Visits 25    Date for PT Re-Evaluation 01/30/23    Authorization Type Medicare & BCBS supplement    Progress Note Due on Visit 20    PT Start Time 1145    PT Stop Time 1227    PT Time Calculation (min) 42 min    Equipment Utilized During Treatment Gait belt    Activity Tolerance Patient tolerated treatment well    Behavior During Therapy WFL for tasks assessed/performed                         Past Medical History:  Diagnosis Date   Acute osteomyelitis of metatarsal bone of left foot (HCC)    Ambulates with cane    straight cane   Anemia    Cervical myelopathy (HCC) 02/06/2018   Chronic kidney disease    dailysis M W F- home   Community acquired pneumonia of left lower lobe of lung 08/18/2021   Complication of anesthesia    Coronary artery disease    Dehiscence of amputation stump of left lower extremity (HCC)    Diabetes mellitus without complication (HCC)    type2   Diabetic foot ulcer (HCC)    Diabetic neuropathy (HCC) 02/06/2018   Dysrhythmia    Gait abnormality 08/24/2018   GERD (gastroesophageal reflux disease)     01/06/20- not current   Gout    History of blood transfusion    History of blood transfusion    History of kidney stones    passed stones   Hypercholesteremia    Hypertension    Hypothyroidism    Neuromuscular disorder (HCC)    neuropathy left leg and bilateral feet   Neuropathy    Partial nontraumatic amputation of foot, 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   PONV (postoperative nausea and vomiting)    Prostate cancer (HCC)    PVD (peripheral vascular disease) (HCC)    with amputations   Past Surgical History:  Procedure Laterality  Date   A-FLUTTER ABLATION N/A 04/06/2020   Procedure: A-FLUTTER ABLATION;  Surgeon: Marinus Maw, MD;  Location: [facility] INV[facility]IVE [facility];  Service: Cardiovascular;  Laterality: N/A;   AMPUTATION Left 12/25/2013   Procedure: AMPUTATION RAY LEFT 5TH RAY;  Surgeon: Nadara Mustard, MD;  Location: [facility] ORS;  Service: Orthopedics;  Laterality: Left;   AMPUTATION Right 12/15/2018   Procedure: AMPUTATION OF 4TH AND 5TH TOES RIGHT FOOT;  Surgeon: Nadara Mustard, MD;  Location: [facility] OR;  Service: Orthopedics;  Laterality: Right;   AMPUTATION Left 02/02/2021   Procedure: LEFT FOOT 4TH RAY AMPUTATION;  Surgeon: Nadara Mustard, MD;  Location: [facility] OR;  Service: Orthopedics;  Laterality: Left;   AMPUTATION Left 05/09/2021   Procedure: LEFT BELOW KNEE AMPUTATION;  Surgeon: Nadara Mustard, MD;  Location: [facility] OR;  Service: Orthopedics;  Laterality: Left;   AMPUTATION Right 07/26/2022   Procedure: RIGHT BELOW KNEE AMPUTATION;  Surgeon: Nadara Mustard, MD;  Location: [facility] OR;  Service: Orthopedics;  Laterality: Right;   APPLICATION OF WOUND VAC Right 12/15/2018   Procedure: APPLICATION OF WOUND VAC;  Surgeon: Nadara Mustard, MD;  Location: [facility] OR;  Service: Orthopedics;  Laterality: Right;   AV FISTULA PLACEMENT Left 03/25/2019   Procedure: LEFT ARM ARTERIOVENOUS (AV) FISTULA CREATION;  Surgeon: Nada Libman, MD;  Location: [facility] OR;  Service: Vascular;  Laterality: Left;   BACK SURGERY     BASCILIC VEIN TRANSPOSITION Left 05/20/2019   Procedure: SECOND STAGE LEFT BASCILIC VEIN TRANSPOSITION;  Surgeon: Nada Libman, MD;  Location: [facility] OR;  Service: Vascular;  Laterality: Left;   BIOPSY  06/22/2022   Procedure: BIOPSY;  Surgeon: Lemar Lofty., MD;  Location: [facility] ENDOSCOPY;  Service: Gastroenterology;;   CARDIAC CATHETERIZATION  02/17/2014   CHOLECYSTECTOMY     COLONOSCOPY  2011   in [place], Normal   COLONOSCOPY N/A 06/23/2022   Procedure: COLONOSCOPY;  Surgeon: Sherrilyn Rist, MD;  Location: [facility] ENDOSCOPY;   Service: Gastroenterology;  Laterality: N/A;   CORONARY ARTERY BYPASS GRAFT  2008   CYSTOSCOPY N/A 06/29/2020   Procedure: CYSTOSCOPY WITH CLOT EVACUATION AND  FULGERATION;  Surgeon: Marcine Matar, MD;  Location[facility] OR;  Service: Urology;  Laterality: N/A;   CYSTOSCOPY WITH FULGERATION Bilateral 06/17/2020   Procedure: CYSTOSCOPY,BILATERAL RETROGRADE, CLOT EVACUATION WITH FULGERATION OF THE BLADDER;  Surgeon: Jannifer Hick, MD;  Location: [facility] OR;  Service: Urology;  Laterality: Bilateral;   CYSTOSCOPY WITH FULGERATION N/A 06/28/2020   Procedure: CYSTOSCOPY WITH CLOT EVACUATION AND FULGERATION OF BLEEDERS;  Surgeon: Marcine Matar, MD;  Location: [facility] OR;  Service: Urology;  Laterality: N/A;  1 HR   ENTEROSCOPY N/A 06/22/2022   Procedure: ENTEROSCOPY;  Surgeon: Meridee Score Netty Starring., MD;  Location: [facility] ENDOSCOPY;  Service: Gastroenterology;  Laterality: N/A;   HEMOSTASIS CLIP PLACEMENT  06/22/2022   Procedure: HEMOSTASIS CLIP PLACEMENT;  Surgeon: Lemar Lofty., MD;  Location: [facility] ENDOSCOPY;  Service: Gastroenterology;;   HOT HEMOSTASIS N/A 06/22/2022   Procedure: HOT HEMOSTASIS (ARGON PLASMA COAGULATION/BICAP);  Surgeon: Lemar Lofty., MD;  Location: [facility] ENDOSCOPY;  Service: Gastroenterology;  Laterality: N/A;   I & D EXTREMITY Right 12/15/2018   Procedure: DEBRIDEMENT RIGHT FOOT;  Surgeon: Nadara Mustard, MD;  Location: [facility] OR;  Service: Orthopedics;  Laterality: Right;   I & D EXTREMITY Right 08/27/2019   Procedure: PARTIAL CUBOID EXCISION RIGHT FOOT;  Surgeon: Nadara Mustard, MD;  Location: [facility] OR;  Service: Orthopedics;  Laterality: Right;   I & D EXTREMITY Right 01/07/2020   Procedure: RIGHT FOOT EXCISION INFECTED BONE;  Surgeon: Nadara Mustard, MD;  Location: [facility] OR;  Service: Orthopedics;  Laterality: Right;   I & D EXTREMITY Right 05/17/2022   Procedure: IRRIGATION AND DEBRIDEMENT RIGHT FOOT ABSCESS;  Surgeon: Nadara Mustard, MD;  Location: [facility] OR;  Service: Orthopedics;   Laterality: Right;   IR PARACENTESIS  10/11/2022   IR PARACENTESIS  12/24/2022   NECK SURGERY     novemver 2019   POLYPECTOMY  06/22/2022   Procedure: POLYPECTOMY;  Surgeon: Meridee Score Netty Starring., MD;  Location: [facility] ENDOSCOPY;  Service: Gastroenterology;;   POLYPECTOMY  06/23/2022   Procedure: POLYPECTOMY;  Surgeon: Sherrilyn Rist, MD;  Location: [facility] ENDOSCOPY;  Service: Gastroenterology;;   STUMP REVISION Left 06/27/2021   Procedure: REVISION LEFT BELOW KNEE AMPUTATION;  Surgeon: Nadara Mustard, MD;  Location: [facility] OR;  Service: Orthopedics;  Laterality: Left;   SUBMUCOSAL LIFTING INJECTION  06/22/2022   Procedure: SUBMUCOSAL LIFTING INJECTION;  Surgeon: Meridee Score Netty Starring., MD;  Location: [facility] ENDOSCOPY;  Service: Gastroenterology;;   SUBMUCOSAL TATTOO INJECTION  06/22/2022   Procedure: SUBMUCOSAL TATTOO INJECTION;  Surgeon: Lemar Lofty., MD;  Location: [facility] ENDOSCOPY;  Service: Gastroenterology;;   TRANSURETHRAL RESECTION OF PROSTATE N/A 06/28/2020   Procedure: TRANSURETHRAL RESECTION OF THE PROSTATE (TURP);  Surgeon: Marcine Matar, MD;  Location: [facility] OR;  Service: Urology;  Laterality: N/A;   WISDOM TOOTH EXTRACTION     Patient Active Problem List   Diagnosis Date Noted   Right below-knee amputee (HCC) 07/29/2022   Severe sepsis (HCC) 07/23/2022   Foot infection 07/22/2022   Acute respiratory failure (HCC) 07/22/2022   Anemia due to chronic blood loss 06/23/2022   Heme positive stool 06/23/2022   Benign neoplasm of cecum 06/23/2022   Benign neoplasm of transverse colon 06/23/2022   Benign neoplasm of descending colon 06/23/2022   Abscess of right foot 05/17/2022   Contraindication to anticoagulation therapy GU Bleed 03/13/2022   Paroxysmal atrial fibrillation (HCC) 08/18/2021   Anemia of chronic renal failure 08/18/2021   Thrombocytopenia (HCC) 08/18/2021   Acute metabolic encephalopathy 08/18/2021   Action induced myoclonus 08/18/2021   Left below-knee amputee (HCC)  05/11/2021   Symptomatic anemia 09/13/2020   COVID-19 virus infection 09/13/2020   Pressure injury of skin 06/29/2020   Gross hematuria 06/16/2020   Typical atrial flutter (HCC) 03/24/2020   Bilateral pleural effusion 02/24/2020   Healthcare maintenance 01/28/2020   Shortness of breath 01/28/2020   History of partial ray amputation of fourth toe of right foot (HCC) 09/08/2019   Chronic cough 06/25/2019   Cutaneous abscess of right foot    Subacute osteomyelitis, right ankle and foot (HCC) 12/14/2018   AKI (acute kidney injury) (HCC) 12/14/2018   ESRD on dialysis (HCC) 12/14/2018   S/P CABG (coronary artery bypass graft) 12/11/2018   Gait abnormality 08/24/2018   Diabetic neuropathy (HCC) 02/06/2018   Cervical myelopathy (HCC) 02/06/2018   Onychomycosis 10/30/2017   Other spondylosis with radiculopathy, cervical region 01/27/2017   Midfoot ulcer, right, limited to breakdown of skin (HCC) 11/15/2016   Lateral epicondylitis, left elbow 08/12/2016   Prostate cancer (HCC) 06/19/2016   Cellulitis of right foot 12/24/2015   Gout 09/14/2012   Hypertension associated with diabetes (HCC) 09/14/2012   Hypothyroidism 09/14/2012   CAD (coronary artery disease) 09/14/2012   Insulin dependent type 2 diabetes mellitus (HCC) 09/14/2012   Hyperlipidemia associated with type 2 diabetes mellitus (HCC)    Neuropathy (HCC)     ONSET DATE: 10/31/2022 right BKA prosthesis delivery  REFERRING DIAG:  E95.284X (ICD-10-CM) - Below-knee amputation of left lower extremity, initial encounter (HCC)  L24.401U (ICD-10-CM) - Below-knee amputation of right lower extremity, initial encounter (HCC)   THERAPY DIAG:  Unsteadiness on feet  Other abnormalities of gait and mobility  Muscle weakness (generalized)  Abnormal posture  History of falling  Impaired functional mobility, balance, gait, and endurance  Rationale for Evaluation and Treatment: Rehabilitation  SUBJECTIVE:   SUBJECTIVE STATEMENT:    He has trouble getting out of chairs that don't have armrests.    PERTINENT HISTORY: ESRD on home dialysis, HTN, HLD, CAD, DM2, PVD, prostate CA, cervical myelopathy,    PAIN:  Are you having pain? Yes  0/10 Rt anterior knee  PRECAUTIONS: Fall and Other: No BP LUE  WEIGHT BEARING RESTRICTIONS: No  FALLS: Has patient fallen in last 6 months? Yes. Number of falls 1 his left prosthesis slipped off & denies injuries.   LIVING ENVIRONMENT: Lives with: lives with their spouse and CNA Mon-Fri 1:00-5:00 Lives in: House  Home Access: Ramped entrance Home layout:  3 level enters on middle floor  which has bedroom & full bath. Stairs: downstairs has chair lift.  Has following equipment at home: Single point cane, Walker - 2 wheeled, Environmental consultant - 4 wheeled, Wheelchair (manual), [employer], Grab bars, and Ramped entry  OCCUPATION: retired  PLOF: he was walking with RW with left prosthesis.    PATIENT GOALS:   to use 2 prostheses to walk with cane. He wants to bring garbage cans up sloped driveway.    OBJECTIVE:  COGNITION: Eval on 11/06/2022:  Overall cognitive status: Within functional limits for tasks assessed  POSTURE: Eval on 11/06/2022: rounded shoulders, forward head, flexed trunk , and weight shift left  LOWER EXTREMITY ROM:  ROM P:passive  A:active Right eval  Hip flexion   Hip extension   Hip abduction   Hip adduction   Hip internal rotation   Hip external rotation   Knee flexion   Knee extension seated P: -6*  Ankle dorsiflexion   Ankle plantarflexion   Ankle inversion   Ankle eversion    (Blank rows = not tested)  LOWER EXTREMITY MMT:  MMT Right eval Left eval Rt/Lt 12/23/22  Hip flexion 4/5 4/5 4+/4+  Hip extension 3/5 3+5 4/4  Hip abduction 35 3+5 4/4  Hip adduction     Hip internal rotation     Hip external rotation     Knee flexion 3+/5 4/5 4/4  Knee extension 3+/5 4/5 4+/4+  Eval MMT gross seated & functional  (Blank rows = not  tested)  TRANSFERS: Eval on 11/06/2022: Sit to stand: SBA 22" w/c using BUEs on armrests requires RW for stabilization Stand to sit: SBA RW for stabilization to 22" w/c using BUEs on armrests  GAIT: 12/16/2022: Pt amb 101' (max tolerable distance) with rollator walker with supervision / verbal cues on posture including position within walker.    Eval on 11/06/2022: Gait pattern: step to pattern, decreased step length- Left, decreased stance time- Right, Right hip hike, Left hip hike, knee flexed in stance- Right, lateral hip instability, trunk flexed, and wide BOS Distance walked: 25' turning 180* CW & CCW Assistive device utilized: Environmental consultant - 2 wheeled and bilateral TTA prostheses Level of assistance: Min A  FUNCTIONAL TESTs:  Eval on 11/06/2022: Standing balance with RW support with close supervision: reaches 5" anteriorly, looks to side only when looking over shoulders and reaches towards floor within 10".    CURRENT PROSTHETIC WEAR ASSESSMENT: 11/06/2022:  Patient is independent with: prosthetic cleaning and ply sock cleaning Patient is dependent with: skin check, residual limb care, proper wear schedule/adjustment, and proper weight-bearing schedule/adjustment Donning prosthesis: SBA / verbal cues Doffing prosthesis: Modified independence Prosthetic wear tolerance: pt reports wearing left prosthesis daily all awake hours for several months.  Right prosthesis has worn daily for 6 days since receiving it for most of awake hours.  Prosthetic weight bearing tolerance: 5 minutes with no c/o pain Edema: right limb has pitting edema with 5 sec capillary refill.  Left limb has no edema noted. Residual limb condition: Left limb has superficial wound 5mm with white perimeter from excessive fluid & CNA reports some drainage;  Right limb has no open areas, distal area has redness & shiny skin, normal temperature, no hair growth.  Prosthetic description: silicon liners (9mm over tibia & 6mm  posterior portion) with pin lock suspension, socket with ratchet suspension, flexible Keel feet     TODAY'S TREATMENT:  DATE:  01/27/2023: Prosthetic Training with Bilateral Transtibial Prostheses:  PT demo & verbal cues on sit to/from stand technique from chairs without armrests.  Pt required modA to arise from 18" chair without armrests. Pt arose from 18" chair 5 reps right armrests & LUE seat of chair then 5 reps left armrests. Pt sit to /from stand from 24" bar stool with single UE support on locked rollator walker for 5 reps with LUE & 5 reps with RUE. Stationary stance in corner with locked rollator anterior for 2 min with intermittent touch walls & walker.  PT recommended adding both of above to HEP. Pt & wife verbalized understanding.   Therapeutic Exercise PT recommended to build endurance riding his stationary bike at home 5 min work 5 min rest for 3 sets 3-5 days / wk.  Once this does not fatigue progress to 6 min work 4 min rest 3 sets, then 7 min /3 for 3 sets... until he can tolerated 30 min straight.  Each level make take weeks until he can progress. Pt & wife verbalized understanding.     TREATMENT:                                                                                                                             DATE:  01/16/2023: Prosthetic Training with Bilateral Transtibial Prostheses:  Pt amb 175feets then 75 feet with rollator walker with supervision / verbal cues on upright trunk.   Step ups on 6 inch step with bilat UE support in bars X 3 reps leading with Rt leg, and 3 reps leading with left leg. Standing balance in //bars without UE support 30 sec X 4, intermittent UE touch down as needed Standing balance in //bars standing on foam 30 sec X 6 with feet together and one UE support, alternating which arm he is using to hold on Standing  balance in //bars with unilateral UE rows green X 15 reps holding on to bar with other hand, performed 15 reps each side then did chest press X 15 each side   01/08/2023: Prosthetic Training with Bilateral Transtibial Prostheses:  Pt questioning right limb skin integrity. No open areas but dry flaking skin over previous wounds on posterior limb. PT recommended using lotion on limb at night & removing in morning. Pt & wife verbalized understanding.  Pt amb 160' with rollator walker with supervision / verbal cues on upright trunk.   PT cued on stand to sit orienting feet to chair and controlled motion to minimize risk of compression fractures and torsion fx of hip.  PT reviewed with patient and wife on walking program with increased frequency short (within home), long (max tolerable distance) and medium distances entering / exiting community buildings. Pt & wife verbalized understanding.  Pt amb 50' and neg 3 steps side stepping with right rail (ascend) with minA.  Pt's LEs fatigued requiring seated rest on steps.    HOME EXERCISE PROGRAM: Access Code: [email or web address]  URL: [email or web address] Date: 12/16/2022 Prepared by: Vladimir Faster  Exercises - Seated Hamstring Stretch with Strap  - 1 x daily - 7 x weekly - 1 sets - 2-3 reps - 30 seconds hold - standing calf stretch with forefoot on small step or brick  - 1 x daily - 7 x weekly - 1 sets - 2-3 reps - 30 seconds hold  to build endurance riding his stationary bike at home 5 min work 5 min rest for 3 sets 3-5 days / wk.  Once this does not fatigue progress to 6 min work 4 min rest 3 sets, then 7 min /3 for 3 sets... until he can tolerated 30 min straight.  Each level make take weeks until he can progress.   Increasing your activity level is important.  Short distances which is walking from one room to another. Work to increase frequency back to prior level. Start with walk each hour during awake hours, then every 45-50 min,  decreasing until he is moving some every 30 min during day   Medium distances are entering & exiting your home or community with limited distances. Start with 4 medium walks which is one outing to one location and increase number of tolerated amounts per day.  Long distance is your highest tolerance for you. Walk until you feel you must rest. Back or leg pain or general fatigue are indicators to maximum tolerance. Monitor by distance or time. Try to walk your BEST distance 1-2 times per day. You should see this increase over time.      ASSESSMENT:  CLINICAL IMPRESSION: PT worked on sit to/from stand with recommendations for HEP activities to facilitate LE strength. Pt needs to see prosthetist to have straps adjusted.   OBJECTIVE IMPAIRMENTS: Abnormal gait, decreased activity tolerance, decreased balance, decreased endurance, decreased knowledge of use of DME, decreased mobility, difficulty walking, decreased ROM, decreased strength, increased edema, postural dysfunction, and prosthetic dependency .   ACTIVITY LIMITATIONS: carrying, standing, stairs, transfers, locomotion level, and standing ADLs  PARTICIPATION LIMITATIONS: community activity and household mobility  PERSONAL FACTORS: Age, Fitness, Past/current experiences, Time since onset of injury/illness/exacerbation, and 3+ comorbidities: see PMH  are also affecting patient's functional outcome.   REHAB POTENTIAL: Good  CLINICAL DECISION MAKING: Evolving/moderate complexity  EVALUATION COMPLEXITY: Moderate   GOALS: Goals reviewed with patient? Yes  SHORT TERM GOALS: Target date: 01/02/2023  Patient verbalizes proper skin care & checking limbs.  Baseline: SEE OBJECTIVE DATA Goal status: MET 12/23/22 2.  Patient tolerates prostheses >12 hrs total /day without skin issues or limb pain Baseline: SEE OBJECTIVE DATA Goal status: MET 12/23/22, he reports he wears them all day long  3.  Patient able to reach 10" and look over both  shoulders with single UE support on RW safely. Baseline: SEE OBJECTIVE DATA Goal status: MET 12/23/22, was able to reach 11 inches  4. Patient ambulates 200' with RW & prostheses with supervision. Baseline: SEE OBJECTIVE DATA Goal status: Ongoing 12/23/2022, 150 feet was max tolerated distance today  5. Patient negotiates ramps & curbs with RW & prostheses with minA. Baseline: SEE OBJECTIVE DATA Goal status: MET 12/23/22  LONG TERM GOALS: Target date: 01/30/2023  Patient demonstrates & verbalized understanding of prosthetic care to enable safe utilization of prosthesis. Baseline: SEE OBJECTIVE DATA Goal status:   01/27/2023  Patient tolerates prostheses wear >90% of awake hours without skin or limb pain issues. Baseline: SEE OBJECTIVE DATA Goal status: Ongoing 01/27/2023  Tasks of [employer] with RW support >/=  30/56 Baseline: SEE OBJECTIVE DATA Goal status: Ongoing 01/27/2023  Patient ambulates >300' with prosthesis & LRAD independently Baseline: SEE OBJECTIVE DATA Goal status: Ongoing 01/27/2023  Patient negotiates ramps, curbs & stairs with single rail with prostheses & LRAD modified independent. Baseline: SEE OBJECTIVE DATA Goal status: Ongoing 01/27/2023  Patient verbalizes & demonstrates understanding of how to properly use fitness equipment to return to gym. Baseline: SEE OBJECTIVE DATA Goal status:  Ongoing 01/27/2023  PLAN:  PT FREQUENCY: 2x/week  PT DURATION: 12 weeks  PLANNED INTERVENTIONS: Therapeutic exercises, Therapeutic activity, Neuromuscular re-education, Balance training, Gait training, Patient/Family education, Self Care, Stair training, Prosthetic training, DME instructions, Aquatic Therapy, Re-evaluation, and physical performance testing  PLAN FOR NEXT SESSION:  check on HEP activities, continue work towards LTGs,  continue prosthetic gait with rollator walker. Balance activities and functional exercises to promote an upright trunk. Strengthening  especially core & LEs    Vladimir Faster, PT, DPT 01/27/2023, 12:47 PM

## 2023-01-28 ENCOUNTER — Ambulatory Visit: Payer: Medicare Other | Admitting: Physical Medicine and Rehabilitation

## 2023-01-29 ENCOUNTER — Ambulatory Visit (INDEPENDENT_AMBULATORY_CARE_PROVIDER_SITE_OTHER): Payer: Medicare Other | Admitting: Physical Therapy

## 2023-01-29 DIAGNOSIS — M6281 Muscle weakness (generalized): Secondary | ICD-10-CM

## 2023-01-29 NOTE — Therapy (Signed)
OUTPATIENT PHYSICAL THERAPY PROSTHETIC TREATMENT    Patient Name: [name] MRN: [id number] DOB:[birth year], 72 y.o., male Today's Date: 01/29/2023  PCP: Adolph Pollack, FNP REFERRING PROVIDER: Aldean Baker, MD  END OF SESSION:  PT End of Session - 01/29/23 1212     Visit Number 16    Number of Visits 25    Date for PT Re-Evaluation 01/30/23    Authorization Type Medicare & BCBS supplement    Progress Note Due on Visit 20    Equipment Utilized During Treatment Gait belt    Activity Tolerance Patient tolerated treatment well    Behavior During Therapy [facility] for tasks assessed/performed                          Past Medical History:  Diagnosis Date   Acute osteomyelitis of metatarsal bone of left foot (HCC)    Ambulates with cane    straight cane   Anemia    Cervical myelopathy (HCC) 02/06/2018   Chronic kidney disease    dailysis M W F- home   Community acquired pneumonia of left lower lobe of lung 08/18/2021   Complication of anesthesia    Coronary artery disease    Dehiscence of amputation stump of left lower extremity (HCC)    Diabetes mellitus without complication (HCC)    type2   Diabetic foot ulcer (HCC)    Diabetic neuropathy (HCC) 02/06/2018   Dysrhythmia    Gait abnormality 08/24/2018   GERD (gastroesophageal reflux disease)     01/06/20- not current   Gout    History of blood transfusion    History of blood transfusion    History of kidney stones    passed stones   Hypercholesteremia    Hypertension    Hypothyroidism    Neuromuscular disorder (HCC)    neuropathy left leg and bilateral feet   Neuropathy    Partial nontraumatic amputation of foot, 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   PONV (postoperative nausea and vomiting)    Prostate cancer (HCC)    PVD (peripheral vascular disease) (HCC)    with amputations   Past Surgical History:  Procedure Laterality Date   A-FLUTTER ABLATION N/A 04/06/2020   Procedure: A-FLUTTER ABLATION;   Surgeon: Marinus Maw, MD;  Location: [facility] INVASIVE CV LAB;  Service: Cardiovascular;  Laterality: N/A;   AMPUTATION Left 12/25/2013   Procedure: AMPUTATION RAY LEFT 5TH RAY;  Surgeon: Nadara Mustard, MD;  Location: [facility] ORS;  Service: Orthopedics;  Laterality: Left;   AMPUTATION Right 12/15/2018   Procedure: AMPUTATION OF 4TH AND 5TH TOES RIGHT FOOT;  Surgeon: Nadara Mustard, MD;  Location: [facility] OR;  Service: Orthopedics;  Laterality: Right;   AMPUTATION Left 02/02/2021   Procedure: LEFT FOOT 4TH RAY AMPUTATION;  Surgeon: Nadara Mustard, MD;  Location: [facility] OR;  Service: Orthopedics;  Laterality: Left;   AMPUTATION Left 05/09/2021   Procedure: LEFT BELOW KNEE AMPUTATION;  Surgeon: Nadara Mustard, MD;  Location: [facility] OR;  Service: Orthopedics;  Laterality: Left;   AMPUTATION Right 07/26/2022   Procedure: RIGHT BELOW KNEE AMPUTATION;  Surgeon: Nadara Mustard, MD;  Location: [facility] OR;  Service: Orthopedics;  Laterality: Right;   APPLICATION OF WOUND VAC Right 12/15/2018   Procedure: APPLICATION OF WOUND VAC;  Surgeon: Nadara Mustard, MD;  Location: [facility] OR;  Service: Orthopedics;  Laterality: Right;   AV FISTULA PLACEMENT Left 03/25/2019   Procedure: LEFT ARM ARTERIOVENOUS (AV) FISTULA CREATION;  Surgeon: Nada Libman, MD;  Location: [facility] OR;  Service: Vascular;  Laterality: Left;   BACK SURGERY     BASCILIC VEIN TRANSPOSITION Left 05/20/2019   Procedure: SECOND STAGE LEFT BASCILIC VEIN TRANSPOSITION;  Surgeon: Nada Libman, MD;  Location: [facility] OR;  Service: Vascular;  Laterality: Left;   BIOPSY  06/22/2022   Procedure: BIOPSY;  Surgeon: Lemar Lofty., MD;  Location: [facility] ENDOSCOPY;  Service: Gastroenterology;;   CARDIAC CATHETERIZATION  02/17/2014   CHOLECYSTECTOMY     COLONOSCOPY  2011   in [place], Normal   COLONOSCOPY N/A 06/23/2022   Procedure: COLONOSCOPY;  Surgeon: Sherrilyn Rist, MD;  Location: [facility] ENDOSCOPY;  Service: Gastroenterology;  Laterality: N/A;   CORONARY ARTERY BYPASS GRAFT   2008   CYSTOSCOPY N/A 06/29/2020   Procedure: CYSTOSCOPY WITH CLOT EVACUATION AND  FULGERATION;  Surgeon: Marcine Matar, MD;  Location: [facility] OR;  Service: Urology;  Laterality: N/A;   CYSTOSCOPY WITH FULGERATION Bilateral 06/17/2020   Procedure: CYSTOSCOPY,BILATERAL RETROGRADE, CLOT EVACUATION WITH FULGERATION OF THE BLADDER;  Surgeon: Jannifer Hick, MD;  Location: [facility] OR;  Service: Urology;  Laterality: Bilateral;   CYSTOSCOPY WITH FULGERATION N/A 06/28/2020   Procedure: CYSTOSCOPY WITH CLOT EVACUATION AND FULGERATION OF BLEEDERS;  Surgeon: Marcine Matar, MD;  Location: [facility] OR;  Service: Urology;  Laterality: N/A;  1 HR   ENTEROSCOPY N/A 06/22/2022   Procedure: ENTEROSCOPY;  Surgeon: Meridee Score Netty Starring., MD;  Location: [facility] ENDOSCOPY;  Service: Gastroenterology;  Laterality: N/A;   HEMOSTASIS CLIP PLACEMENT  06/22/2022   Procedure: HEMOSTASIS CLIP PLACEMENT;  Surgeon: Lemar Lofty., MD;  Location: [facility] ENDOSCOPY;  Service: Gastroenterology;;   HOT HEMOSTASIS N/A 06/22/2022   Procedure: HOT HEMOSTASIS (ARGON PLASMA COAGULATION/BICAP);  Surgeon: Lemar Lofty., MD;  Location: [facility] ENDOSCOPY;  Service: Gastroenterology;  Laterality: N/A;   I & D EXTREMITY Right 12/15/2018   Procedure: DEBRIDEMENT RIGHT FOOT;  Surgeon: Nadara Mustard, MD;  Location: [facility] OR;  Service: Orthopedics;  Laterality: Right;   I & D EXTREMITY Right 08/27/2019   Procedure: PARTIAL CUBOID EXCISION RIGHT FOOT;  Surgeon: Nadara Mustard, MD;  Location: [facility] OR;  Service: Orthopedics;  Laterality: Right;   I & D EXTREMITY Right 01/07/2020   Procedure: RIGHT FOOT EXCISION INFECTED BONE;  Surgeon: Nadara Mustard, MD;  Location: [facility] OR;  Service: Orthopedics;  Laterality: Right;   I & D EXTREMITY Right 05/17/2022   Procedure: IRRIGATION AND DEBRIDEMENT RIGHT FOOT ABSCESS;  Surgeon: Nadara Mustard, MD;  Location: [facility] OR;  Service: Orthopedics;  Laterality: Right;   IR PARACENTESIS  10/11/2022   IR PARACENTESIS  12/24/2022    NECK SURGERY     novemver 2019   POLYPECTOMY  06/22/2022   Procedure: POLYPECTOMY;  Surgeon: Meridee Score Netty Starring., MD;  Location: [facility] ENDOSCOPY;  Service: Gastroenterology;;   POLYPECTOMY  06/23/2022   Procedure: POLYPECTOMY;  Surgeon: Sherrilyn Rist, MD;  Location: [facility] ENDOSCOPY;  Service: Gastroenterology;;   STUMP REVISION Left 06/27/2021   Procedure: REVISION LEFT BELOW KNEE AMPUTATION;  Surgeon: Nadara Mustard, MD;  Location: [facility] OR;  Service: Orthopedics;  Laterality: Left;   SUBMUCOSAL LIFTING INJECTION  06/22/2022   Procedure: SUBMUCOSAL LIFTING INJECTION;  Surgeon: Meridee Score Netty Starring., MD;  Location: [facility] ENDOSCOPY;  Service: Gastroenterology;;   SUBMUCOSAL TATTOO INJECTION  06/22/2022   Procedure: SUBMUCOSAL TATTOO INJECTION;  Surgeon: Lemar Lofty., MD;  Location: [facility] ENDOSCOPY;  Service: Gastroenterology;;   TRANSURETHRAL RESECTION OF PROSTATE N/A 06/28/2020  Procedure: TRANSURETHRAL RESECTION OF THE PROSTATE (TURP);  Surgeon: Marcine Matar, MD;  Location: [facility] OR;  Service: Urology;  Laterality: N/A;   WISDOM TOOTH EXTRACTION     Patient Active Problem List   Diagnosis Date Noted   Right below-knee amputee (HCC) 07/29/2022   Severe sepsis (HCC) 07/23/2022   Foot infection 07/22/2022   Acute respiratory failure (HCC) 07/22/2022   Anemia due to chronic blood loss 06/23/2022   Heme positive stool 06/23/2022   Benign neoplasm of cecum 06/23/2022   Benign neoplasm of transverse colon 06/23/2022   Benign neoplasm of descending colon 06/23/2022   Abscess of right foot 05/17/2022   Contraindication to anticoagulation therapy GU Bleed 03/13/2022   Paroxysmal atrial fibrillation (HCC) 08/18/2021   Anemia of chronic renal failure 08/18/2021   Thrombocytopenia (HCC) 08/18/2021   Acute metabolic encephalopathy 08/18/2021   Action induced myoclonus 08/18/2021   Left below-knee amputee (HCC) 05/11/2021   Symptomatic anemia 09/13/2020   COVID-19 virus infection  09/13/2020   Pressure injury of skin 06/29/2020   Gross hematuria 06/16/2020   Typical atrial flutter (HCC) 03/24/2020   Bilateral pleural effusion 02/24/2020   Healthcare maintenance 01/28/2020   Shortness of breath 01/28/2020   History of partial ray amputation of fourth toe of right foot (HCC) 09/08/2019   Chronic cough 06/25/2019   Cutaneous abscess of right foot    Subacute osteomyelitis, right ankle and foot (HCC) 12/14/2018   AKI (acute kidney injury) (HCC) 12/14/2018   ESRD on dialysis (HCC) 12/14/2018   S/P CABG (coronary artery bypass graft) 12/11/2018   Gait abnormality 08/24/2018   Diabetic neuropathy (HCC) 02/06/2018   Cervical myelopathy (HCC) 02/06/2018   Onychomycosis 10/30/2017   Other spondylosis with radiculopathy, cervical region 01/27/2017   Midfoot ulcer, right, limited to breakdown of skin (HCC) 11/15/2016   Lateral epicondylitis, left elbow 08/12/2016   Prostate cancer (HCC) 06/19/2016   Cellulitis of right foot 12/24/2015   Gout 09/14/2012   Hypertension associated with diabetes (HCC) 09/14/2012   Hypothyroidism 09/14/2012   CAD (coronary artery disease) 09/14/2012   Insulin dependent type 2 diabetes mellitus (HCC) 09/14/2012   Hyperlipidemia associated with type 2 diabetes mellitus (HCC)    Neuropathy (HCC)     ONSET DATE: 10/31/2022 right BKA prosthesis delivery  REFERRING DIAG:  Z61.096E (ICD-10-CM) - Below-knee amputation of left lower extremity, initial encounter (HCC)  A54.098J (ICD-10-CM) - Below-knee amputation of right lower extremity, initial encounter (HCC)   THERAPY DIAG:  Muscle weakness (generalized)  Rationale for Evaluation and Treatment: Rehabilitation  SUBJECTIVE:   SUBJECTIVE STATEMENT: patient reported that he was very weak today and thinks he is dehydrated.  He requested no PT today.   PERTINENT HISTORY: ESRD on home dialysis, HTN, HLD, CAD, DM2, PVD, prostate CA, cervical myelopathy,    PAIN:  Are you having pain? Yes   0/10 Rt anterior knee  PRECAUTIONS: Fall and Other: No BP LUE  WEIGHT BEARING RESTRICTIONS: No  FALLS: Has patient fallen in last 6 months? Yes. Number of falls 1 his left prosthesis slipped off & denies injuries.   LIVING ENVIRONMENT: Lives with: lives with their spouse and CNA Mon-Fri 1:00-5:00 Lives in: House  Home Access: Ramped entrance Home layout:  3 level enters on middle floor which has bedroom & full bath. Stairs: downstairs has chair lift.  Has following equipment at home: Single point cane, Walker - 2 wheeled, Environmental consultant - 4 wheeled, Wheelchair (manual), [employer], Grab bars, and Ramped entry  OCCUPATION: retired  PLOF: he  was walking with RW with left prosthesis.    PATIENT GOALS:   to use 2 prostheses to walk with cane. He wants to bring garbage cans up sloped driveway.    OBJECTIVE:  COGNITION: Eval on 11/06/2022:  Overall cognitive status: Within functional limits for tasks assessed  POSTURE: Eval on 11/06/2022: rounded shoulders, forward head, flexed trunk , and weight shift left  LOWER EXTREMITY ROM:  ROM P:passive  A:active Right eval  Hip flexion   Hip extension   Hip abduction   Hip adduction   Hip internal rotation   Hip external rotation   Knee flexion   Knee extension seated P: -6*  Ankle dorsiflexion   Ankle plantarflexion   Ankle inversion   Ankle eversion    (Blank rows = not tested)  LOWER EXTREMITY MMT:  MMT Right eval Left eval Rt/Lt 12/23/22  Hip flexion 4/5 4/5 4+/4+  Hip extension 3/5 3+5 4/4  Hip abduction 35 3+5 4/4  Hip adduction     Hip internal rotation     Hip external rotation     Knee flexion 3+/5 4/5 4/4  Knee extension 3+/5 4/5 4+/4+  Eval MMT gross seated & functional  (Blank rows = not tested)  TRANSFERS: Eval on 11/06/2022: Sit to stand: SBA 22" w/c using BUEs on armrests requires RW for stabilization Stand to sit: SBA RW for stabilization to 22" w/c using BUEs on armrests  GAIT: 12/16/2022: Pt amb 101'  (max tolerable distance) with rollator walker with supervision / verbal cues on posture including position within walker.    Eval on 11/06/2022: Gait pattern: step to pattern, decreased step length- Left, decreased stance time- Right, Right hip hike, Left hip hike, knee flexed in stance- Right, lateral hip instability, trunk flexed, and wide BOS Distance walked: 25' turning 180* CW & CCW Assistive device utilized: Environmental consultant - 2 wheeled and bilateral TTA prostheses Level of assistance: Min A  FUNCTIONAL TESTs:  Eval on 11/06/2022: Standing balance with RW support with close supervision: reaches 5" anteriorly, looks to side only when looking over shoulders and reaches towards floor within 10".    CURRENT PROSTHETIC WEAR ASSESSMENT: 11/06/2022:  Patient is independent with: prosthetic cleaning and ply sock cleaning Patient is dependent with: skin check, residual limb care, proper wear schedule/adjustment, and proper weight-bearing schedule/adjustment Donning prosthesis: SBA / verbal cues Doffing prosthesis: Modified independence Prosthetic wear tolerance: pt reports wearing left prosthesis daily all awake hours for several months.  Right prosthesis has worn daily for 6 days since receiving it for most of awake hours.  Prosthetic weight bearing tolerance: 5 minutes with no c/o pain Edema: right limb has pitting edema with 5 sec capillary refill.  Left limb has no edema noted. Residual limb condition: Left limb has superficial wound 5mm with white perimeter from excessive fluid & CNA reports some drainage;  Right limb has no open areas, distal area has redness & shiny skin, normal temperature, no hair growth.  Prosthetic description: silicon liners (9mm over tibia & 6mm posterior portion) with pin lock suspension, socket with ratchet suspension, flexible Keel feet     TODAY'S TREATMENT:  DATE:  01/27/2023: PT held today's session as patient did not have enough energy for session to be productive.   TREATMENT:                                                                                                                             DATE:  01/27/2023: Prosthetic Training with Bilateral Transtibial Prostheses:  PT demo & verbal cues on sit to/from stand technique from chairs without armrests.  Pt required modA to arise from 18" chair without armrests. Pt arose from 18" chair 5 reps right armrests & LUE seat of chair then 5 reps left armrests. Pt sit to /from stand from 24" bar stool with single UE support on locked rollator walker for 5 reps with LUE & 5 reps with RUE. Stationary stance in corner with locked rollator anterior for 2 min with intermittent touch walls & walker.  PT recommended adding both of above to HEP. Pt & wife verbalized understanding.   Therapeutic Exercise PT recommended to build endurance riding his stationary bike at home 5 min work 5 min rest for 3 sets 3-5 days / wk.  Once this does not fatigue progress to 6 min work 4 min rest 3 sets, then 7 min /3 for 3 sets... until he can tolerated 30 min straight.  Each level make take weeks until he can progress. Pt & wife verbalized understanding.     TREATMENT:                                                                                                                             DATE:  01/16/2023: Prosthetic Training with Bilateral Transtibial Prostheses:  Pt amb 170feets then 75 feet with rollator walker with supervision / verbal cues on upright trunk.   Step ups on 6 inch step with bilat UE support in bars X 3 reps leading with Rt leg, and 3 reps leading with left leg. Standing balance in //bars without UE support 30 sec X 4, intermittent UE touch down as needed Standing balance in //bars standing on foam 30 sec X 6 with feet together and one UE support, alternating which arm he is using to hold  on Standing balance in //bars with unilateral UE rows green X 15 reps holding on to bar with other hand, performed 15 reps each side then did chest press X 15 each side    HOME EXERCISE PROGRAM: Access Code:  [email or web address] URL: [email or web address] Date: 12/16/2022 Prepared by: Vladimir Faster  Exercises - Seated Hamstring Stretch with Strap  - 1 x daily - 7 x weekly - 1 sets - 2-3 reps - 30 seconds hold - standing calf stretch with forefoot on small step or brick  - 1 x daily - 7 x weekly - 1 sets - 2-3 reps - 30 seconds hold  to build endurance riding his stationary bike at home 5 min work 5 min rest for 3 sets 3-5 days / wk.  Once this does not fatigue progress to 6 min work 4 min rest 3 sets, then 7 min /3 for 3 sets... until he can tolerated 30 min straight.  Each level make take weeks until he can progress.   Increasing your activity level is important.  Short distances which is walking from one room to another. Work to increase frequency back to prior level. Start with walk each hour during awake hours, then every 45-50 min, decreasing until he is moving some every 30 min during day   Medium distances are entering & exiting your home or community with limited distances. Start with 4 medium walks which is one outing to one location and increase number of tolerated amounts per day.  Long distance is your highest tolerance for you. Walk until you feel you must rest. Back or leg pain or general fatigue are indicators to maximum tolerance. Monitor by distance or time. Try to walk your BEST distance 1-2 times per day. You should see this increase over time.      ASSESSMENT:  CLINICAL IMPRESSION: Today's PT session held due to patient not feeling well.    OBJECTIVE IMPAIRMENTS: Abnormal gait, decreased activity tolerance, decreased balance, decreased endurance, decreased knowledge of use of DME, decreased mobility, difficulty walking, decreased ROM, decreased strength,  increased edema, postural dysfunction, and prosthetic dependency .   ACTIVITY LIMITATIONS: carrying, standing, stairs, transfers, locomotion level, and standing ADLs  PARTICIPATION LIMITATIONS: community activity and household mobility  PERSONAL FACTORS: Age, Fitness, Past/current experiences, Time since onset of injury/illness/exacerbation, and 3+ comorbidities: see PMH  are also affecting patient's functional outcome.   REHAB POTENTIAL: Good  CLINICAL DECISION MAKING: Evolving/moderate complexity  EVALUATION COMPLEXITY: Moderate   GOALS: Goals reviewed with patient? Yes  SHORT TERM GOALS: Target date: 01/02/2023  Patient verbalizes proper skin care & checking limbs.  Baseline: SEE OBJECTIVE DATA Goal status: MET 12/23/22 2.  Patient tolerates prostheses >12 hrs total /day without skin issues or limb pain Baseline: SEE OBJECTIVE DATA Goal status: MET 12/23/22, he reports he wears them all day long  3.  Patient able to reach 10" and look over both shoulders with single UE support on RW safely. Baseline: SEE OBJECTIVE DATA Goal status: MET 12/23/22, was able to reach 11 inches  4. Patient ambulates 200' with RW & prostheses with supervision. Baseline: SEE OBJECTIVE DATA Goal status: Ongoing 12/23/2022, 150 feet was max tolerated distance today  5. Patient negotiates ramps & curbs with RW & prostheses with minA. Baseline: SEE OBJECTIVE DATA Goal status: MET 12/23/22  LONG TERM GOALS: Target date: 01/30/2023  Patient demonstrates & verbalized understanding of prosthetic care to enable safe utilization of prosthesis. Baseline: SEE OBJECTIVE DATA Goal status:   01/27/2023  Patient tolerates prostheses wear >90% of awake hours without skin or limb pain issues. Baseline: SEE OBJECTIVE DATA Goal status: Ongoing 01/27/2023  Tasks of [employer] with RW support >/= 30/56 Baseline: SEE OBJECTIVE DATA Goal status: Ongoing 01/27/2023  Patient ambulates >  300' with prosthesis & LRAD  independently Baseline: SEE OBJECTIVE DATA Goal status: Ongoing 01/27/2023  Patient negotiates ramps, curbs & stairs with single rail with prostheses & LRAD modified independent. Baseline: SEE OBJECTIVE DATA Goal status: Ongoing 01/27/2023  Patient verbalizes & demonstrates understanding of how to properly use fitness equipment to return to gym. Baseline: SEE OBJECTIVE DATA Goal status:  Ongoing 01/27/2023  PLAN:  PT FREQUENCY: 2x/week  PT DURATION: 12 weeks  PLANNED INTERVENTIONS: Therapeutic exercises, Therapeutic activity, Neuromuscular re-education, Balance training, Gait training, Patient/Family education, Self Care, Stair training, Prosthetic training, DME instructions, Aquatic Therapy, Re-evaluation, and physical performance testing  PLAN FOR NEXT SESSION:   check on HEP activities, continue work towards LTGs,  continue prosthetic gait with rollator walker. Balance activities and functional exercises to promote an upright trunk. Strengthening especially core & LEs    Vladimir Faster, PT, DPT 01/29/2023, 12:16 PM

## 2023-01-31 ENCOUNTER — Other Ambulatory Visit (HOSPITAL_COMMUNITY): Payer: Self-pay | Admitting: Nephrology

## 2023-01-31 DIAGNOSIS — R188 Other ascites: Secondary | ICD-10-CM

## 2023-02-03 ENCOUNTER — Encounter: Payer: Self-pay | Admitting: Physical Therapy

## 2023-02-03 ENCOUNTER — Ambulatory Visit (INDEPENDENT_AMBULATORY_CARE_PROVIDER_SITE_OTHER): Payer: Medicare Other | Admitting: Physical Therapy

## 2023-02-03 DIAGNOSIS — L98498 Non-pressure chronic ulcer of skin of other sites with other specified severity: Secondary | ICD-10-CM

## 2023-02-03 DIAGNOSIS — M6281 Muscle weakness (generalized): Secondary | ICD-10-CM

## 2023-02-03 DIAGNOSIS — Z7409 Other reduced mobility: Secondary | ICD-10-CM

## 2023-02-03 DIAGNOSIS — R2681 Unsteadiness on feet: Secondary | ICD-10-CM | POA: Diagnosis not present

## 2023-02-03 DIAGNOSIS — Z9181 History of falling: Secondary | ICD-10-CM

## 2023-02-03 DIAGNOSIS — R2689 Other abnormalities of gait and mobility: Secondary | ICD-10-CM | POA: Diagnosis not present

## 2023-02-03 DIAGNOSIS — R293 Abnormal posture: Secondary | ICD-10-CM | POA: Diagnosis not present

## 2023-02-03 NOTE — Therapy (Signed)
OUTPATIENT PHYSICAL THERAPY PROSTHETIC TREATMENT, PROGRESS NOTE & RECERTIFICATION    Patient Name: [name] MRN: [id number] DOB:[birth year], 72 y.o., male Today's Date: 02/03/2023  PCP: Adolph Pollack, FNP REFERRING PROVIDER: Aldean Baker, MD  Progress Note Reporting Period 12/23/2022 to 02/03/2023  See note below for Objective Data and Assessment of Progress/Goals.      END OF SESSION:  PT End of Session - 02/03/23 1145     Visit Number 17    Number of Visits 25    Date for PT Re-Evaluation 03/05/23    Authorization Type Medicare & BCBS supplement    Progress Note Due on Visit --    PT Start Time 1145    PT Stop Time 1226    PT Time Calculation (min) 41 min    Equipment Utilized During Treatment Gait belt    Activity Tolerance Patient tolerated treatment well    Behavior During Therapy WFL for tasks assessed/performed                           Past Medical History:  Diagnosis Date   Acute osteomyelitis of metatarsal bone of left foot (HCC)    Ambulates with cane    straight cane   Anemia    Cervical myelopathy (HCC) 02/06/2018   Chronic kidney disease    dailysis M W F- home   Community acquired pneumonia of left lower lobe of lung 08/18/2021   Complication of anesthesia    Coronary artery disease    Dehiscence of amputation stump of left lower extremity (HCC)    Diabetes mellitus without complication (HCC)    type2   Diabetic foot ulcer (HCC)    Diabetic neuropathy (HCC) 02/06/2018   Dysrhythmia    Gait abnormality 08/24/2018   GERD (gastroesophageal reflux disease)     01/06/20- not current   Gout    History of blood transfusion    History of blood transfusion    History of kidney stones    passed stones   Hypercholesteremia    Hypertension    Hypothyroidism    Neuromuscular disorder (HCC)    neuropathy left leg and bilateral feet   Neuropathy    Partial nontraumatic amputation of foot, 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   PONV  (postoperative nausea and vomiting)    Prostate cancer (HCC)    PVD (peripheral vascular disease) (HCC)    with amputations   Past Surgical History:  Procedure Laterality Date   A-FLUTTER ABLATION N/A 04/06/2020   Procedure: A-FLUTTER ABLATION;  Surgeon: Marinus Maw, MD;  Location: [facility] INV[facility]IVE [facility];  Service: Cardiovascular;  Laterality: N/A;   AMPUTATION Left 12/25/2013   Procedure: AMPUTATION RAY LEFT 5TH RAY;  Surgeon: Nadara Mustard, MD;  Location: [facility] ORS;  Service: Orthopedics;  Laterality: Left;   AMPUTATION Right 12/15/2018   Procedure: AMPUTATION OF 4TH AND 5TH TOES RIGHT FOOT;  Surgeon: Nadara Mustard, MD;  Location: [facility] OR;  Service: Orthopedics;  Laterality: Right;   AMPUTATION Left 02/02/2021   Procedure: LEFT FOOT 4TH RAY AMPUTATION;  Surgeon: Nadara Mustard, MD;  Location: [facility] OR;  Service: Orthopedics;  Laterality: Left;   AMPUTATION Left 05/09/2021   Procedure: LEFT BELOW KNEE AMPUTATION;  Surgeon: Nadara Mustard, MD;  Location: [facility] OR;  Service: Orthopedics;  Laterality: Left;   AMPUTATION Right 07/26/2022   Procedure: RIGHT BELOW KNEE AMPUTATION;  Surgeon: Nadara Mustard, MD;  Location: [facility] OR;  Service: Orthopedics;  Laterality: Right;   APPLICATION OF WOUND VAC Right 12/15/2018   Procedure: APPLICATION OF WOUND VAC;  Surgeon: Nadara Mustard, MD;  Location: [facility] OR;  Service: Orthopedics;  Laterality: Right;   AV FISTULA PLACEMENT Left 03/25/2019   Procedure: LEFT ARM ARTERIOVENOUS (AV) FISTULA CREATION;  Surgeon: Nada Libman, MD;  Location: [facility] OR;  Service: Vascular;  Laterality: Left;   BACK SURGERY     BASCILIC VEIN TRANSPOSITION Left 05/20/2019   Procedure: SECOND STAGE LEFT BASCILIC VEIN TRANSPOSITION;  Surgeon: Nada Libman, MD;  Location: [facility] OR;  Service: Vascular;  Laterality: Left;   BIOPSY  06/22/2022   Procedure: BIOPSY;  Surgeon: Lemar Lofty., MD;  Location: [facility] ENDOSCOPY;  Service: Gastroenterology;;   CARDIAC CATHETERIZATION  02/17/2014    CHOLECYSTECTOMY     COLONOSCOPY  2011   in [place], Normal   COLONOSCOPY N/A 06/23/2022   Procedure: COLONOSCOPY;  Surgeon: Sherrilyn Rist, MD;  Location: [facility] ENDOSCOPY;  Service: Gastroenterology;  Laterality: N/A;   CORONARY ARTERY BYPASS GRAFT  2008   CYSTOSCOPY N/A 06/29/2020   Procedure: CYSTOSCOPY WITH CLOT EVACUATION AND  FULGERATION;  Surgeon: Marcine Matar, MD;  Location: [facility] OR;  Service: Urology;  Laterality: N/A;   CYSTOSCOPY WITH FULGERATION Bilateral 06/17/2020   Procedure: CYSTOSCOPY,BILATERAL RETROGRADE, CLOT EVACUATION WITH FULGERATION OF THE BLADDER;  Surgeon: Jannifer Hick, MD;  Location: [facility] OR;  Service: Urology;  Laterality: Bilateral;   CYSTOSCOPY WITH FULGERATION N/A 06/28/2020   Procedure: CYSTOSCOPY WITH CLOT EVACUATION AND FULGERATION OF BLEEDERS;  Surgeon: Marcine Matar, MD;  Location: [facility] OR;  Service: Urology;  Laterality: N/A;  1 HR   ENTEROSCOPY N/A 06/22/2022   Procedure: ENTEROSCOPY;  Surgeon: Meridee Score Netty Starring., MD;  Location: [facility] ENDOSCOPY;  Service: Gastroenterology;  Laterality: N/A;   HEMOSTASIS CLIP PLACEMENT  06/22/2022   Procedure: HEMOSTASIS CLIP PLACEMENT;  Surgeon: Lemar Lofty., MD;  Location: [facility] ENDOSCOPY;  Service: Gastroenterology;;   HOT HEMOSTASIS N/A 06/22/2022   Procedure: HOT HEMOSTASIS (ARGON PLASMA COAGULATION/BICAP);  Surgeon: Lemar Lofty., MD;  Location: [facility] ENDOSCOPY;  Service: Gastroenterology;  Laterality: N/A;   I & D EXTREMITY Right 12/15/2018   Procedure: DEBRIDEMENT RIGHT FOOT;  Surgeon: Nadara Mustard, MD;  Location: [facility] OR;  Service: Orthopedics;  Laterality: Right;   I & D EXTREMITY Right 08/27/2019   Procedure: PARTIAL CUBOID EXCISION RIGHT FOOT;  Surgeon: Nadara Mustard, MD;  Location: [facility] OR;  Service: Orthopedics;  Laterality: Right;   I & D EXTREMITY Right 01/07/2020   Procedure: RIGHT FOOT EXCISION INFECTED BONE;  Surgeon: Nadara Mustard, MD;  Location: [facility] OR;  Service: Orthopedics;  Laterality:  Right;   I & D EXTREMITY Right 05/17/2022   Procedure: IRRIGATION AND DEBRIDEMENT RIGHT FOOT ABSCESS;  Surgeon: Nadara Mustard, MD;  Location: [facility] OR;  Service: Orthopedics;  Laterality: Right;   IR PARACENTESIS  10/11/2022   IR PARACENTESIS  12/24/2022   NECK SURGERY     novemver 2019   POLYPECTOMY  06/22/2022   Procedure: POLYPECTOMY;  Surgeon: Meridee Score Netty Starring., MD;  Location: [name] ENDOSCOPY;  Service: Gastroenterology;;   POLYPECTOMY  06/23/2022   Procedure: POLYPECTOMY;  Surgeon: Sherrilyn Rist, MD;  Location: [facility] ENDOSCOPY;  Service: Gastroenterology;;   STUMP REVISION Left 06/27/2021   Procedure: REVISION LEFT BELOW KNEE AMPUTATION;  Surgeon: Nadara Mustard, MD;  Location: [facility] OR;  Service: Orthopedics;  Laterality: Left;   SUBMUCOSAL LIFTING INJECTION  06/22/2022   Procedure: SUBMUCOSAL  LIFTING INJECTION;  Surgeon: Meridee Score Netty Starring., MD;  Location: [facility] ENDOSCOPY;  Service: Gastroenterology;;   SUBMUCOSAL TATTOO INJECTION  06/22/2022   Procedure: SUBMUCOSAL TATTOO INJECTION;  Surgeon: Lemar Lofty., MD;  Location: [facility] ENDOSCOPY;  Service: Gastroenterology;;   TRANSURETHRAL RESECTION OF PROSTATE N/A 06/28/2020   Procedure: TRANSURETHRAL RESECTION OF THE PROSTATE (TURP);  Surgeon: Marcine Matar, MD;  Location: [facility] OR;  Service: Urology;  Laterality: N/A;   WISDOM TOOTH EXTRACTION     Patient Active Problem List   Diagnosis Date Noted   Right below-knee amputee (HCC) 07/29/2022   Severe sepsis (HCC) 07/23/2022   Foot infection 07/22/2022   Acute respiratory failure (HCC) 07/22/2022   Anemia due to chronic blood loss 06/23/2022   Heme positive stool 06/23/2022   Benign neoplasm of cecum 06/23/2022   Benign neoplasm of transverse colon 06/23/2022   Benign neoplasm of descending colon 06/23/2022   Abscess of right foot 05/17/2022   Contraindication to anticoagulation therapy GU Bleed 03/13/2022   Paroxysmal atrial fibrillation (HCC) 08/18/2021   Anemia of chronic renal  failure 08/18/2021   Thrombocytopenia (HCC) 08/18/2021   Acute metabolic encephalopathy 08/18/2021   Action induced myoclonus 08/18/2021   Left below-knee amputee (HCC) 05/11/2021   Symptomatic anemia 09/13/2020   COVID-19 virus infection 09/13/2020   Pressure injury of skin 06/29/2020   Gross hematuria 06/16/2020   Typical atrial flutter (HCC) 03/24/2020   Bilateral pleural effusion 02/24/2020   Healthcare maintenance 01/28/2020   Shortness of breath 01/28/2020   History of partial ray amputation of fourth toe of right foot (HCC) 09/08/2019   Chronic cough 06/25/2019   Cutaneous abscess of right foot    Subacute osteomyelitis, right ankle and foot (HCC) 12/14/2018   AKI (acute kidney injury) (HCC) 12/14/2018   ESRD on dialysis (HCC) 12/14/2018   S/P CABG (coronary artery bypass graft) 12/11/2018   Gait abnormality 08/24/2018   Diabetic neuropathy (HCC) 02/06/2018   Cervical myelopathy (HCC) 02/06/2018   Onychomycosis 10/30/2017   Other spondylosis with radiculopathy, cervical region 01/27/2017   Midfoot ulcer, right, limited to breakdown of skin (HCC) 11/15/2016   Lateral epicondylitis, left elbow 08/12/2016   Prostate cancer (HCC) 06/19/2016   Cellulitis of right foot 12/24/2015   Gout 09/14/2012   Hypertension associated with diabetes (HCC) 09/14/2012   Hypothyroidism 09/14/2012   CAD (coronary artery disease) 09/14/2012   Insulin dependent type 2 diabetes mellitus (HCC) 09/14/2012   Hyperlipidemia associated with type 2 diabetes mellitus (HCC)    Neuropathy (HCC)     ONSET DATE: 10/31/2022 right BKA prosthesis delivery  REFERRING DIAG:  V25.366Y (ICD-10-CM) - Below-knee amputation of left lower extremity, initial encounter (HCC)  Q03.474Q (ICD-10-CM) - Below-knee amputation of right lower extremity, initial encounter (HCC)   THERAPY DIAG:  Muscle weakness (generalized)  Unsteadiness on feet  Other abnormalities of gait and mobility  Abnormal posture  History of  falling  Impaired functional mobility, balance, gait, and endurance  Non-pressure chronic ulcer of skin of other sites with other specified severity (HCC)  Rationale for Evaluation and Treatment: Rehabilitation  SUBJECTIVE:   SUBJECTIVE STATEMENT:  He is now walking more.  He is able to walk in house by himself but limits due to "lazy."  He has not done sit <> stand exercises PT recommended.   PERTINENT HISTORY: ESRD on home dialysis, HTN, HLD, CAD, DM2, PVD, prostate CA, cervical myelopathy,    PAIN:  Are you having pain? Yes  0/10 Rt anterior knee  PRECAUTIONS: Fall and Other:  No BP LUE  WEIGHT BEARING RESTRICTIONS: No  FALLS: Has patient fallen in last 6 months? Yes. Number of falls 1 his left prosthesis slipped off & denies injuries.   LIVING ENVIRONMENT: Lives with: lives with their spouse and CNA Mon-Fri 1:00-5:00 Lives in: House  Home Access: Ramped entrance Home layout:  3 level enters on middle floor which has bedroom & full bath. Stairs: downstairs has chair lift.  Has following equipment at home: Single point cane, Walker - 2 wheeled, Environmental consultant - 4 wheeled, Wheelchair (manual), [employer], Grab bars, and Ramped entry  OCCUPATION: retired  PLOF: he was walking with RW with left prosthesis.    PATIENT GOALS:   to use 2 prostheses to walk with cane. He wants to bring garbage cans up sloped driveway.    OBJECTIVE:  COGNITION: Eval on 11/06/2022:  Overall cognitive status: Within functional limits for tasks assessed  POSTURE: Eval on 11/06/2022: rounded shoulders, forward head, flexed trunk , and weight shift left  LOWER EXTREMITY ROM:  ROM P:passive  A:active Right eval  Hip flexion   Hip extension   Hip abduction   Hip adduction   Hip internal rotation   Hip external rotation   Knee flexion   Knee extension seated P: -6*  Ankle dorsiflexion   Ankle plantarflexion   Ankle inversion   Ankle eversion    (Blank rows = not tested)  LOWER EXTREMITY  MMT:  MMT Right eval Left eval Rt/Lt 12/23/22  Hip flexion 4/5 4/5 4+/4+  Hip extension 3/5 3+5 4/4  Hip abduction 35 3+5 4/4  Hip adduction     Hip internal rotation     Hip external rotation     Knee flexion 3+/5 4/5 4/4  Knee extension 3+/5 4/5 4+/4+  Eval MMT gross seated & functional  (Blank rows = not tested)  TRANSFERS: Eval on 11/06/2022: Sit to stand: SBA 22" w/c using BUEs on armrests requires RW for stabilization Stand to sit: SBA RW for stabilization to 22" w/c using BUEs on armrests  GAIT: 12/16/2022: Pt amb 101' (max tolerable distance) with rollator walker with supervision / verbal cues on posture including position within walker.    Eval on 11/06/2022: Gait pattern: step to pattern, decreased step length- Left, decreased stance time- Right, Right hip hike, Left hip hike, knee flexed in stance- Right, lateral hip instability, trunk flexed, and wide BOS Distance walked: 25' turning 180* CW & CCW Assistive device utilized: Environmental consultant - 2 wheeled and bilateral TTA prostheses Level of assistance: Min A  FUNCTIONAL TESTs:  02/03/2023: Sharlene Motts Balance 9/56 and tasks of Berg with locked rollator walker 35/56  [facility] PT Assessment - 02/03/23 1145       Standardized Balance Assessment   Standardized Balance Assessment Berg Balance Test      Berg Balance Test   Sit to Stand Needs minimal aid to stand or to stabilize   task with RW support = 3   Standing Unsupported Unable to stand 30 seconds unassisted   task with RW support = 3   Sitting with Back Unsupported but Feet Supported on Floor or Stool Able to sit safely and securely 2 minutes    Stand to Sit Sits independently, has uncontrolled descent   task with RW support = 3   Transfers Able to transfer safely, definite need of hands    Standing Unsupported with Eyes Closed Needs help to keep from falling   task with RW support = 3   Standing Unsupported with  Feet Together Needs help to attain position and unable to hold for 15  seconds   task with RW support = 3   From Standing, Reach Forward with Outstretched Arm Loses balance while trying/requires external support   task with RW support = 3   From Standing Position, Pick up Object from Floor Unable to try/needs assist to keep balance   task with RW support = 3   From Standing Position, Turn to Look Behind Over each Shoulder Needs assist to keep from losing balance and falling   task with RW support = 2   Turn 360 Degrees Needs assistance while turning   task with RW support = 1   Standing Unsupported, Alternately Place Feet on Step/Stool Needs assistance to keep from falling or unable to try   task with RW support = 1   Standing Unsupported, One Foot in [employer] balance while stepping or standing   task with RW support = 2   Standing on One Leg Unable to try or needs assist to prevent fall   task with RW support = 1   Total Score 9    Berg comment: tasks of Berg with RW support = 35/56             Eval on 11/06/2022: Standing balance with RW support with close supervision: reaches 5" anteriorly, looks to side only when looking over shoulders and reaches towards floor within 10".    CURRENT PROSTHETIC WEAR ASSESSMENT: 11/06/2022:  Patient is independent with: prosthetic cleaning and ply sock cleaning Patient is dependent with: skin check, residual limb care, proper wear schedule/adjustment, and proper weight-bearing schedule/adjustment Donning prosthesis: SBA / verbal cues Doffing prosthesis: Modified independence Prosthetic wear tolerance: pt reports wearing left prosthesis daily all awake hours for several months.  Right prosthesis has worn daily for 6 days since receiving it for most of awake hours.  Prosthetic weight bearing tolerance: 5 minutes with no c/o pain Edema: right limb has pitting edema with 5 sec capillary refill.  Left limb has no edema noted. Residual limb condition: Left limb has superficial wound 5mm with white perimeter from excessive  fluid & CNA reports some drainage;  Right limb has no open areas, distal area has redness & shiny skin, normal temperature, no hair growth.  Prosthetic description: silicon liners (9mm over tibia & 6mm posterior portion) with pin lock suspension, socket with ratchet suspension, flexible Keel feet     TODAY'S TREATMENT:                                                                                                                             DATE:  02/03/2023: Prosthetic Training with Bilateral Transtibial Prostheses:  Pt saw prosthetist to adjust straps on both prostheses and recess the pin on left prosthesis.  The left prosthesis does not feel right which is causing him to catch the prosthetic toes and he is using more energy.   PT demo &  reviewed sit to / from stand HEP every other day from 24" bar stool with single UE support on locked rollator walker for 5 reps with LUE & 5 reps with RUE. PT demo & verbal cues on scooting chair forward, backward & turning to enable ability to move to/from table. Pt return demo with 2 movements each direction with supervision / verbal cues.   Physical Performance Testing  See objective data for Berg   TREATMENT:                                                                                                                             DATE:  01/27/2023: PT held today's session as patient did not have enough energy for session to be productive.   TREATMENT:                                                                                                                             DATE:  01/27/2023: Prosthetic Training with Bilateral Transtibial Prostheses:  PT demo & verbal cues on sit to/from stand technique from chairs without armrests.  Pt required modA to arise from 18" chair without armrests. Pt arose from 18" chair 5 reps right armrests & LUE seat of chair then 5 reps left armrests. Pt sit to /from stand from 24" bar stool with single UE support on  locked rollator walker for 5 reps with LUE & 5 reps with RUE. Stationary stance in corner with locked rollator anterior for 2 min with intermittent touch walls & walker.  PT recommended adding both of above to HEP. Pt & wife verbalized understanding.   Therapeutic Exercise PT recommended to build endurance riding his stationary bike at home 5 min work 5 min rest for 3 sets 3-5 days / wk.  Once this does not fatigue progress to 6 min work 4 min rest 3 sets, then 7 min /3 for 3 sets... until he can tolerated 30 min straight.  Each level make take weeks until he can progress. Pt & wife verbalized understanding.     TREATMENT:  DATE:  01/16/2023: Prosthetic Training with Bilateral Transtibial Prostheses:  Pt amb 152feets then 75 feet with rollator walker with supervision / verbal cues on upright trunk.   Step ups on 6 inch step with bilat UE support in bars X 3 reps leading with Rt leg, and 3 reps leading with left leg. Standing balance in //bars without UE support 30 sec X 4, intermittent UE touch down as needed Standing balance in //bars standing on foam 30 sec X 6 with feet together and one UE support, alternating which arm he is using to hold on Standing balance in //bars with unilateral UE rows green X 15 reps holding on to bar with other hand, performed 15 reps each side then did chest press X 15 each side    HOME EXERCISE PROGRAM: Access Code: [email or web address] URL: [email or web address] Date: 12/16/2022 Prepared by: Vladimir Faster  Exercises - Seated Hamstring Stretch with Strap  - 1 x daily - 7 x weekly - 1 sets - 2-3 reps - 30 seconds hold - standing calf stretch with forefoot on small step or brick  - 1 x daily - 7 x weekly - 1 sets - 2-3 reps - 30 seconds hold  to build endurance riding his stationary bike at home 5 min work 5 min rest for 3 sets 3-5  days / wk.  Once this does not fatigue progress to 6 min work 4 min rest 3 sets, then 7 min /3 for 3 sets... until he can tolerated 30 min straight.  Each level make take weeks until he can progress.   Increasing your activity level is important.  Short distances which is walking from one room to another. Work to increase frequency back to prior level. Start with walk each hour during awake hours, then every 45-50 min, decreasing until he is moving some every 30 min during day   Medium distances are entering & exiting your home or community with limited distances. Start with 4 medium walks which is one outing to one location and increase number of tolerated amounts per day.  Long distance is your highest tolerance for you. Walk until you feel you must rest. Back or leg pain or general fatigue are indicators to maximum tolerance. Monitor by distance or time. Try to walk your BEST distance 1-2 times per day. You should see this increase over time.      ASSESSMENT:  CLINICAL IMPRESSION: Patient has made excellent progress with PT.  He met 4 LTGs checked today.  PT did not check 2 LTGs associated with gait due to issues with fit of left prosthesis. Pt has appt tomorrow with prosthetist. Pt would benefit from another month of skilled PT to fine tune functional activities.   OBJECTIVE IMPAIRMENTS: Abnormal gait, decreased activity tolerance, decreased balance, decreased endurance, decreased knowledge of use of DME, decreased mobility, difficulty walking, decreased ROM, decreased strength, increased edema, postural dysfunction, and prosthetic dependency .   ACTIVITY LIMITATIONS: carrying, standing, stairs, transfers, locomotion level, and standing ADLs  PARTICIPATION LIMITATIONS: community activity and household mobility  PERSONAL FACTORS: Age, Fitness, Past/current experiences, Time since onset of injury/illness/exacerbation, and 3+ comorbidities: see PMH  are also affecting patient's functional  outcome.   REHAB POTENTIAL: Good  CLINICAL DECISION MAKING: Evolving/moderate complexity  EVALUATION COMPLEXITY: Moderate   GOALS: Goals reviewed with patient? Yes  SHORT TERM GOALS: Target date: 01/02/2023  Patient verbalizes proper skin care & checking limbs.  Baseline: SEE OBJECTIVE DATA Goal status: MET 12/23/22 2.  Patient tolerates prostheses >12  hrs total /day without skin issues or limb pain Baseline: SEE OBJECTIVE DATA Goal status: MET 12/23/22, he reports he wears them all day long  3.  Patient able to reach 10" and look over both shoulders with single UE support on RW safely. Baseline: SEE OBJECTIVE DATA Goal status: MET 12/23/22, was able to reach 11 inches  4. Patient ambulates 200' with RW & prostheses with supervision. Baseline: SEE OBJECTIVE DATA Goal status: Ongoing 12/23/2022, 150 feet was max tolerated distance today  5. Patient negotiates ramps & curbs with RW & prostheses with minA. Baseline: SEE OBJECTIVE DATA Goal status: MET 12/23/22  LONG TERM GOALS: Target date: 01/30/2023  Patient demonstrates & verbalized understanding of prosthetic care to enable safe utilization of prosthesis. Baseline: SEE OBJECTIVE DATA Goal status:   MET 02/03/2023  Patient tolerates prostheses wear >90% of awake hours without skin or limb pain issues. Baseline: SEE OBJECTIVE DATA Goal status: MET 02/03/2023  Tasks of [employer] with RW support >/= 30/56 Baseline: SEE OBJECTIVE DATA Goal status: MET 02/03/2023  Patient ambulates >300' with prosthesis & LRAD independently Baseline: SEE OBJECTIVE DATA Goal status: Ongoing 02/03/2023  Patient negotiates ramps, curbs & stairs with single rail with prostheses & LRAD modified independent. Baseline: SEE OBJECTIVE DATA Goal status: Ongoing 02/03/2023  Patient verbalizes & demonstrates understanding of how to properly use fitness equipment to return to gym. Baseline: SEE OBJECTIVE DATA Goal status:  MET  02/03/2023  PLAN:  PT FREQUENCY: 2x/week  PT DURATION: 5 weeks  PLANNED INTERVENTIONS: Therapeutic exercises, Therapeutic activity, Neuromuscular re-education, Balance training, Gait training, Patient/Family education, Self Care, Stair training, Prosthetic training, DME instructions, Aquatic Therapy, Re-evaluation, and physical performance testing  PLAN FOR NEXT SESSION:  check remaining 2 LTGs, check prosthetist appt    Vladimir Faster, PT, DPT 02/03/2023, 12:50 PM

## 2023-02-05 ENCOUNTER — Ambulatory Visit (INDEPENDENT_AMBULATORY_CARE_PROVIDER_SITE_OTHER): Payer: Medicare Other | Admitting: Physical Therapy

## 2023-02-05 ENCOUNTER — Encounter: Payer: Self-pay | Admitting: Physical Therapy

## 2023-02-05 DIAGNOSIS — R293 Abnormal posture: Secondary | ICD-10-CM

## 2023-02-05 DIAGNOSIS — R2681 Unsteadiness on feet: Secondary | ICD-10-CM | POA: Diagnosis not present

## 2023-02-05 DIAGNOSIS — R2689 Other abnormalities of gait and mobility: Secondary | ICD-10-CM

## 2023-02-05 DIAGNOSIS — Z7409 Other reduced mobility: Secondary | ICD-10-CM

## 2023-02-05 DIAGNOSIS — M6281 Muscle weakness (generalized): Secondary | ICD-10-CM | POA: Diagnosis not present

## 2023-02-05 NOTE — Therapy (Signed)
OUTPATIENT PHYSICAL THERAPY PROSTHETIC TREATMENT & DISCHARGE SUMMARY    Patient Name: [name] MRN: [id number] DOB:02/28/1951, 72 y.o., male Today's Date: 02/05/2023  PCP: Adolph Pollack, FNP REFERRING PROVIDER: Aldean Baker, MD  PHYSICAL THERAPY DISCHARGE SUMMARY  Visits from Start of Care: 18  Current functional level related to goals / functional outcomes: See below   Remaining deficits: See below   Education / Equipment: Patient & wife were educated in prosthetic care & HEP which they appear to understand.    Patient agrees to discharge. Patient goals were partially met. Patient is being discharged due to meeting the stated rehab goals. & lack of progress towards remaining LTG.    END OF SESSION:  PT End of Session - 02/05/23 1149     Visit Number 18    Number of Visits 25    Date for PT Re-Evaluation 03/05/23    Authorization Type Medicare & BCBS supplement    PT Start Time 1145    PT Stop Time 1223    PT Time Calculation (min) 38 min    Equipment Utilized During Treatment Gait belt    Activity Tolerance Patient tolerated treatment well    Behavior During Therapy WFL for tasks assessed/performed                           Past Medical History:  Diagnosis Date   Acute osteomyelitis of metatarsal bone of left foot (HCC)    Ambulates with cane    straight cane   Anemia    Cervical myelopathy (HCC) 02/06/2018   Chronic kidney disease    dailysis M W F- home   Community acquired pneumonia of left lower lobe of lung 08/18/2021   Complication of anesthesia    Coronary artery disease    Dehiscence of amputation stump of left lower extremity (HCC)    Diabetes mellitus without complication (HCC)    type2   Diabetic foot ulcer (HCC)    Diabetic neuropathy (HCC) 02/06/2018   Dysrhythmia    Gait abnormality 08/24/2018   GERD (gastroesophageal reflux disease)     01/06/20- not current   Gout    History of blood transfusion    History  of blood transfusion    History of kidney stones    passed stones   Hypercholesteremia    Hypertension    Hypothyroidism    Neuromuscular disorder (HCC)    neuropathy left leg and bilateral feet   Neuropathy    Partial nontraumatic amputation of foot, 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   PONV (postoperative nausea and vomiting)    Prostate cancer (HCC)    PVD (peripheral vascular disease) (HCC)    with amputations   Past Surgical History:  Procedure Laterality Date   A-FLUTTER ABLATION N/A 04/06/2020   Procedure: A-FLUTTER ABLATION;  Surgeon: Marinus Maw, MD;  Location: [facility] INV[facility] LAB;  Service: Cardiovascular;  Laterality: N/A;   AMPUTATION Left 12/25/2013   Procedure: AMPUTATION RAY LEFT 5TH RAY;  Surgeon: Nadara Mustard, MD;  Location: [facility] ORS;  Service: Orthopedics;  Laterality: Left;   AMPUTATION Right 12/15/2018   Procedure: AMPUTATION OF 4TH AND 5TH TOES RIGHT FOOT;  Surgeon: Nadara Mustard, MD;  Location: [facility] OR;  Service: Orthopedics;  Laterality: Right;   AMPUTATION Left 02/02/2021   Procedure: LEFT FOOT 4TH RAY AMPUTATION;  Surgeon: Nadara Mustard, MD;  Location: [facility] OR;  Service: Orthopedics;  Laterality: Left;   AMPUTATION Left  05/09/2021   Procedure: LEFT BELOW KNEE AMPUTATION;  Surgeon: Nadara Mustard, MD;  Location: [facility] OR;  Service: Orthopedics;  Laterality: Left;   AMPUTATION Right 07/26/2022   Procedure: RIGHT BELOW KNEE AMPUTATION;  Surgeon: Nadara Mustard, MD;  Location: [facility] OR;  Service: Orthopedics;  Laterality: Right;   APPLICATION OF WOUND VAC Right 12/15/2018   Procedure: APPLICATION OF WOUND VAC;  Surgeon: Nadara Mustard, MD;  Location: [facility] OR;  Service: Orthopedics;  Laterality: Right;   AV FISTULA PLACEMENT Left 03/25/2019   Procedure: LEFT ARM ARTERIOVENOUS (AV) FISTULA CREATION;  Surgeon: Nada Libman, MD;  Location: [facility] OR;  Service: Vascular;  Laterality: Left;   BACK SURGERY     BASCILIC VEIN TRANSPOSITION Left 05/20/2019   Procedure: SECOND STAGE LEFT BASCILIC VEIN  TRANSPOSITION;  Surgeon: Nada Libman, MD;  Location: [facility] OR;  Service: Vascular;  Laterality: Left;   BIOPSY  06/22/2022   Procedure: BIOPSY;  Surgeon: Lemar Lofty., MD;  Location: [facility] ENDOSCOPY;  Service: Gastroenterology;;   CARDIAC CATHETERIZATION  02/17/2014   CHOLECYSTECTOMY     COLONOSCOPY  2011   in [place], Normal   COLONOSCOPY N/A 06/23/2022   Procedure: COLONOSCOPY;  Surgeon: Sherrilyn Rist, MD;  Location: [facility] ENDOSCOPY;  Service: Gastroenterology;  Laterality: N/A;   CORONARY ARTERY BYPASS GRAFT  2008   CYSTOSCOPY N/A 06/29/2020   Procedure: CYSTOSCOPY WITH CLOT EVACUATION AND  FULGERATION;  Surgeon: Marcine Matar, MD;  Location: [facility] OR;  Service: Urology;  Laterality: N/A;   CYSTOSCOPY WITH FULGERATION Bilateral 06/17/2020   Procedure: CYSTOSCOPY,BILATERAL RETROGRADE, CLOT EVACUATION WITH FULGERATION OF THE BLADDER;  Surgeon: Jannifer Hick, MD;  Location: [facility] OR;  Service: Urology;  Laterality: Bilateral;   CYSTOSCOPY WITH FULGERATION N/A 06/28/2020   Procedure: CYSTOSCOPY WITH CLOT EVACUATION AND FULGERATION OF BLEEDERS;  Surgeon: Marcine Matar, MD;  Location: [facility] OR;  Service: Urology;  Laterality: N/A;  1 HR   ENTEROSCOPY N/A 06/22/2022   Procedure: ENTEROSCOPY;  Surgeon: Meridee Score Netty Starring., MD;  Location: [facility] ENDOSCOPY;  Service: Gastroenterology;  Laterality: N/A;   HEMOSTASIS CLIP PLACEMENT  06/22/2022   Procedure: HEMOSTASIS CLIP PLACEMENT;  Surgeon: Lemar Lofty., MD;  Location: [facility] ENDOSCOPY;  Service: Gastroenterology;;   HOT HEMOSTASIS N/A 06/22/2022   Procedure: HOT HEMOSTASIS (ARGON PLASMA COAGULATION/BICAP);  Surgeon: Lemar Lofty., MD;  Location: [facility] ENDOSCOPY;  Service: Gastroenterology;  Laterality: N/A;   I & D EXTREMITY Right 12/15/2018   Procedure: DEBRIDEMENT RIGHT FOOT;  Surgeon: Nadara Mustard, MD;  Location: [facility] OR;  Service: Orthopedics;  Laterality: Right;   I & D EXTREMITY Right 08/27/2019   Procedure: PARTIAL  CUBOID EXCISION RIGHT FOOT;  Surgeon: Nadara Mustard, MD;  Location: [facility] OR;  Service: Orthopedics;  Laterality: Right;   I & D EXTREMITY Right 01/07/2020   Procedure: RIGHT FOOT EXCISION INFECTED BONE;  Surgeon: Nadara Mustard, MD;  Location: [facility] OR;  Service: Orthopedics;  Laterality: Right;   I & D EXTREMITY Right 05/17/2022   Procedure: IRRIGATION AND DEBRIDEMENT RIGHT FOOT ABSCESS;  Surgeon: Nadara Mustard, MD;  Location: [facility] OR;  Service: Orthopedics;  Laterality: Right;   IR PARACENTESIS  10/11/2022   IR PARACENTESIS  12/24/2022   NECK SURGERY     novemver 2019   POLYPECTOMY  06/22/2022   Procedure: POLYPECTOMY;  Surgeon: Meridee Score Netty Starring., MD;  Location: [facility] ENDOSCOPY;  Service: Gastroenterology;;   POLYPECTOMY  06/23/2022   Procedure: POLYPECTOMY;  Surgeon: Charlie Pitter  III, MD;  Location: [facility] ENDOSCOPY;  Service: Gastroenterology;;   STUMP REVISION Left 06/27/2021   Procedure: REVISION LEFT BELOW KNEE AMPUTATION;  Surgeon: Nadara Mustard, MD;  Location: [facility] OR;  Service: Orthopedics;  Laterality: Left;   SUBMUCOSAL LIFTING INJECTION  06/22/2022   Procedure: SUBMUCOSAL LIFTING INJECTION;  Surgeon: Meridee Score Netty Starring., MD;  Location: [facility] ENDOSCOPY;  Service: Gastroenterology;;   SUBMUCOSAL TATTOO INJECTION  06/22/2022   Procedure: SUBMUCOSAL TATTOO INJECTION;  Surgeon: Lemar Lofty., MD;  Location: [facility] ENDOSCOPY;  Service: Gastroenterology;;   TRANSURETHRAL RESECTION OF PROSTATE N/A 06/28/2020   Procedure: TRANSURETHRAL RESECTION OF THE PROSTATE (TURP);  Surgeon: Marcine Matar, MD;  Location: [facility] OR;  Service: Urology;  Laterality: N/A;   WISDOM TOOTH EXTRACTION     Patient Active Problem List   Diagnosis Date Noted   Right below-knee amputee (HCC) 07/29/2022   Severe sepsis (HCC) 07/23/2022   Foot infection 07/22/2022   Acute respiratory failure (HCC) 07/22/2022   Anemia due to chronic blood loss 06/23/2022   Heme positive stool 06/23/2022   Benign neoplasm of cecum  06/23/2022   Benign neoplasm of transverse colon 06/23/2022   Benign neoplasm of descending colon 06/23/2022   Abscess of right foot 05/17/2022   Contraindication to anticoagulation therapy GU Bleed 03/13/2022   Paroxysmal atrial fibrillation (HCC) 08/18/2021   Anemia of chronic renal failure 08/18/2021   Thrombocytopenia (HCC) 08/18/2021   Acute metabolic encephalopathy 08/18/2021   Action induced myoclonus 08/18/2021   Left below-knee amputee (HCC) 05/11/2021   Symptomatic anemia 09/13/2020   COVID-19 virus infection 09/13/2020   Pressure injury of skin 06/29/2020   Gross hematuria 06/16/2020   Typical atrial flutter (HCC) 03/24/2020   Bilateral pleural effusion 02/24/2020   Healthcare maintenance 01/28/2020   Shortness of breath 01/28/2020   History of partial ray amputation of fourth toe of right foot (HCC) 09/08/2019   Chronic cough 06/25/2019   Cutaneous abscess of right foot    Subacute osteomyelitis, right ankle and foot (HCC) 12/14/2018   AKI (acute kidney injury) (HCC) 12/14/2018   ESRD on dialysis (HCC) 12/14/2018   S/P CABG (coronary artery bypass graft) 12/11/2018   Gait abnormality 08/24/2018   Diabetic neuropathy (HCC) 02/06/2018   Cervical myelopathy (HCC) 02/06/2018   Onychomycosis 10/30/2017   Other spondylosis with radiculopathy, cervical region 01/27/2017   Midfoot ulcer, right, limited to breakdown of skin (HCC) 11/15/2016   Lateral epicondylitis, left elbow 08/12/2016   Prostate cancer (HCC) 06/19/2016   Cellulitis of right foot 12/24/2015   Gout 09/14/2012   Hypertension associated with diabetes (HCC) 09/14/2012   Hypothyroidism 09/14/2012   CAD (coronary artery disease) 09/14/2012   Insulin dependent type 2 diabetes mellitus (HCC) 09/14/2012   Hyperlipidemia associated with type 2 diabetes mellitus (HCC)    Neuropathy (HCC)     ONSET DATE: 10/31/2022 right BKA prosthesis delivery  REFERRING DIAG:  O13.086V (ICD-10-CM) - Below-knee amputation of  left lower extremity, initial encounter (HCC)  H84.696E (ICD-10-CM) - Below-knee amputation of right lower extremity, initial encounter (HCC)   THERAPY DIAG:  Muscle weakness (generalized)  Unsteadiness on feet  Other abnormalities of gait and mobility  Abnormal posture  Impaired functional mobility, balance, gait, and endurance  Rationale for Evaluation and Treatment: Rehabilitation  SUBJECTIVE:   SUBJECTIVE STATEMENT:  He is having procedure for abdominal fluid tomorrow.   PERTINENT HISTORY: ESRD on home dialysis, HTN, HLD, CAD, DM2, PVD, prostate CA, cervical myelopathy,    PAIN:  Are you having pain? Yes  0/10 Rt anterior knee  PRECAUTIONS: Fall and Other: No BP LUE  WEIGHT BEARING RESTRICTIONS: No  FALLS: Has patient fallen in last 6 months? Yes. Number of falls 1 his left prosthesis slipped off & denies injuries.   LIVING ENVIRONMENT: Lives with: lives with their spouse and CNA Mon-Fri 1:00-5:00 Lives in: House  Home Access: Ramped entrance Home layout:  3 level enters on middle floor which has bedroom & full bath. Stairs: downstairs has chair lift.  Has following equipment at home: Single point cane, Walker - 2 wheeled, Environmental consultant - 4 wheeled, Wheelchair (manual), [employer], Grab bars, and Ramped entry  OCCUPATION: retired  PLOF: he was walking with RW with left prosthesis.    PATIENT GOALS:   to use 2 prostheses to walk with cane. He wants to bring garbage cans up sloped driveway.    OBJECTIVE:  COGNITION: Eval on 11/06/2022:  Overall cognitive status: Within functional limits for tasks assessed  POSTURE: Eval on 11/06/2022: rounded shoulders, forward head, flexed trunk , and weight shift left  LOWER EXTREMITY ROM:  ROM P:passive  A:active Right eval  Hip flexion   Hip extension   Hip abduction   Hip adduction   Hip internal rotation   Hip external rotation   Knee flexion   Knee extension seated P: -6*  Ankle dorsiflexion   Ankle plantarflexion    Ankle inversion   Ankle eversion    (Blank rows = not tested)  LOWER EXTREMITY MMT:  MMT Right eval Left eval Rt/Lt 12/23/22  Hip flexion 4/5 4/5 4+/4+  Hip extension 3/5 3+5 4/4  Hip abduction 35 3+5 4/4  Hip adduction     Hip internal rotation     Hip external rotation     Knee flexion 3+/5 4/5 4/4  Knee extension 3+/5 4/5 4+/4+  Eval MMT gross seated & functional  (Blank rows = not tested)  TRANSFERS: 02/05/2023:  sit to/from stand from 18" chair with single armrest modified independent.   Eval on 11/06/2022: Sit to stand: SBA 22" w/c using BUEs on armrests requires RW for stabilization Stand to sit: SBA RW for stabilization to 22" w/c using BUEs on armrests  GAIT: 02/05/2019: pt amb 217' in 3 min 30 sec with rollator walker modified independent.  Pt & wife report neg ramp & curb with rollator walker with no issues. Pt reported too fatigued today.   12/16/2022: Pt amb 101' (max tolerable distance) with rollator walker with supervision / verbal cues on posture including position within walker.    Eval on 11/06/2022: Gait pattern: step to pattern, decreased step length- Left, decreased stance time- Right, Right hip hike, Left hip hike, knee flexed in stance- Right, lateral hip instability, trunk flexed, and wide BOS Distance walked: 25' turning 180* CW & CCW Assistive device utilized: Environmental consultant - 2 wheeled and bilateral TTA prostheses Level of assistance: Min A  FUNCTIONAL TESTs:  02/03/2023: Sharlene Motts Balance 9/56 and tasks of Berg with locked rollator walker 35/56    Eval on 11/06/2022: Standing balance with RW support with close supervision: reaches 5" anteriorly, looks to side only when looking over shoulders and reaches towards floor within 10".    CURRENT PROSTHETIC WEAR ASSESSMENT: 02/05/2023: Patient is independent with: prosthetic cleaning and ply sock cleaning, skin check, residual limb care, proper wear schedule/adjustment, and proper weight-bearing  schedule/adjustment. Pt don & doff prosthesis modified independent. Pt wears prostheses most awake hours without skin or pain issues.    11/06/2022:  Patient is independent with:  prosthetic cleaning and ply sock cleaning Patient is dependent with: skin check, residual limb care, proper wear schedule/adjustment, and proper weight-bearing schedule/adjustment Donning prosthesis: SBA / verbal cues Doffing prosthesis: Modified independence Prosthetic wear tolerance: pt reports wearing left prosthesis daily all awake hours for several months.  Right prosthesis has worn daily for 6 days since receiving it for most of awake hours.  Prosthetic weight bearing tolerance: 5 minutes with no c/o pain Edema: right limb has pitting edema with 5 sec capillary refill.  Left limb has no edema noted. Residual limb condition: Left limb has superficial wound 5mm with white perimeter from excessive fluid & CNA reports some drainage;  Right limb has no open areas, distal area has redness & shiny skin, normal temperature, no hair growth.  Prosthetic description: silicon liners (9mm over tibia & 6mm posterior portion) with pin lock suspension, socket with ratchet suspension, flexible Keel feet     TODAY'S TREATMENT:                                                                                                                             DATE:  02/05/2023: Prosthetic Training with Bilateral Transtibial Prostheses:    See objective data above. Pt amb 217' prior to fatigue but reports able to walk further with CNA at home.   Therapeutic Exercise: PT reviewed sit to / from stand HEP every other day from 24" bar stool with  BUE support on locked rollator walker for 5 reps; sit to/from stand from 18" chair with LUE on armrests (RUE on seat) & 5 reps with RUE on armrests (LUE on seat): scooting chair forward, backward & turning to enable ability to move to/from table for 3-5 movements each direction. Standing in corner  with locked rollator walker in front of him.  Try to stand for 30 sec without touching - count out loud and freeze count when touching to stabilize.  Walking program with short, long & medium walks. See HEP below.  Pt and wife verbalized understanding.    02/03/2023: Prosthetic Training with Bilateral Transtibial Prostheses:  Pt saw prosthetist to adjust straps on both prostheses and recess the pin on left prosthesis.  The left prosthesis does not feel right which is causing him to catch the prosthetic toes and he is using more energy.   PT demo & reviewed sit to / from stand HEP every other day from 24" bar stool with single UE support on locked rollator walker for 5 reps with LUE & 5 reps with RUE. PT demo & verbal cues on scooting chair forward, backward & turning to enable ability to move to/from table. Pt return demo with 2 movements each direction with supervision / verbal cues.   Physical Performance Testing  See objective data for Berg   TREATMENT:  DATE:  01/27/2023: PT held today's session as patient did not have enough energy for session to be productive.   TREATMENT:                                                                                                                             DATE:  01/27/2023: Prosthetic Training with Bilateral Transtibial Prostheses:  PT demo & verbal cues on sit to/from stand technique from chairs without armrests.  Pt required modA to arise from 18" chair without armrests. Pt arose from 18" chair 5 reps right armrests & LUE seat of chair then 5 reps left armrests. Pt sit to /from stand from 24" bar stool with single UE support on locked rollator walker for 5 reps with LUE & 5 reps with RUE. Stationary stance in corner with locked rollator anterior for 2 min with intermittent touch walls & walker.  PT recommended adding both of  above to HEP. Pt & wife verbalized understanding.   Therapeutic Exercise PT recommended to build endurance riding his stationary bike at home 5 min work 5 min rest for 3 sets 3-5 days / wk.  Once this does not fatigue progress to 6 min work 4 min rest 3 sets, then 7 min /3 for 3 sets... until he can tolerated 30 min straight.  Each level make take weeks until he can progress. Pt & wife verbalized understanding.     TREATMENT:                                                                                                                             DATE:  01/16/2023: Prosthetic Training with Bilateral Transtibial Prostheses:  Pt amb 169feets then 75 feet with rollator walker with supervision / verbal cues on upright trunk.   Step ups on 6 inch step with bilat UE support in bars X 3 reps leading with Rt leg, and 3 reps leading with left leg. Standing balance in //bars without UE support 30 sec X 4, intermittent UE touch down as needed Standing balance in //bars standing on foam 30 sec X 6 with feet together and one UE support, alternating which arm he is using to hold on Standing balance in //bars with unilateral UE rows green X 15 reps holding on to bar with other hand, performed 15 reps each side then did chest press X 15 each side    HOME EXERCISE PROGRAM: Access Code:  [email or web address] URL: [email or web address] Date: 12/16/2022 Prepared by: Vladimir Faster  Exercises - Seated Hamstring Stretch with Strap  - 1 x daily - 7 x weekly - 1 sets - 2-3 reps - 30 seconds hold - standing calf stretch with forefoot on small step or brick  - 1 x daily - 7 x weekly - 1 sets - 2-3 reps - 30 seconds hold  Sit to / from stand HEP every other day from 24" bar stool with  BUE support on locked rollator walker for 5 reps; sit to/from stand from 18" chair with LUE on armrests (RUE on seat) & 5 reps with RUE on armrests (LUE on seat): scooting chair forward, backward & turning to enable ability to move  to/from table for 3-5 movements each direction. Standing in corner with locked rollator walker in front of him.  Try to stand for 30 sec without touching - count out loud and freeze count when touching to stabilize.    Increasing your activity level is important.  Short distances which is walking from one room to another. Work to increase frequency back to prior level. Start with walk each hour during awake hours, then every 45-50 min, decreasing until he is moving some every 30 min during day   Medium distances are entering & exiting your home or community with limited distances. Start with 4 medium walks which is one outing to one location and increase number of tolerated amounts per day.  Long distance is your highest tolerance for you. Walk until you feel you must rest. Back or leg pain or general fatigue are indicators to maximum tolerance. Monitor by distance or time. Try to walk your BEST distance 1-2 times per day. You should see this increase over time.      ASSESSMENT:  CLINICAL IMPRESSION: Patient has made excellent progress with PT. He did not fully meet LTGs but feels he is can make distance and negotiate ramp/curbs safely.  Patient's progress has hit a plateau as he is not compliant with HEP. He does continue to go to gym 2x/wk working with Systems analyst but this fatigues him.  Patient may benefit from additional PT in future when he is ready.    OBJECTIVE IMPAIRMENTS: Abnormal gait, decreased activity tolerance, decreased balance, decreased endurance, decreased knowledge of use of DME, decreased mobility, difficulty walking, decreased ROM, decreased strength, increased edema, postural dysfunction, and prosthetic dependency .   ACTIVITY LIMITATIONS: carrying, standing, stairs, transfers, locomotion level, and standing ADLs  PARTICIPATION LIMITATIONS: community activity and household mobility  PERSONAL FACTORS: Age, Fitness, Past/current experiences, Time since onset of  injury/illness/exacerbation, and 3+ comorbidities: see PMH  are also affecting patient's functional outcome.   REHAB POTENTIAL: Good  CLINICAL DECISION MAKING: Evolving/moderate complexity  EVALUATION COMPLEXITY: Moderate   GOALS: Goals reviewed with patient? Yes  SHORT TERM GOALS: Target date: 01/02/2023  Patient verbalizes proper skin care & checking limbs.  Baseline: SEE OBJECTIVE DATA Goal status: MET 12/23/22 2.  Patient tolerates prostheses >12 hrs total /day without skin issues or limb pain Baseline: SEE OBJECTIVE DATA Goal status: MET 12/23/22, he reports he wears them all day long  3.  Patient able to reach 10" and look over both shoulders with single UE support on RW safely. Baseline: SEE OBJECTIVE DATA Goal status: MET 12/23/22, was able to reach 11 inches  4. Patient ambulates 200' with RW & prostheses with supervision. Baseline: SEE OBJECTIVE DATA Goal status: Ongoing 12/23/2022, 150 feet was max tolerated distance today  5. Patient  negotiates ramps & curbs with RW & prostheses with minA. Baseline: SEE OBJECTIVE DATA Goal status: MET 12/23/22  LONG TERM GOALS: Target date: 01/30/2023  Patient demonstrates & verbalized understanding of prosthetic care to enable safe utilization of prosthesis. Baseline: SEE OBJECTIVE DATA Goal status:   MET 02/03/2023  Patient tolerates prostheses wear >90% of awake hours without skin or limb pain issues. Baseline: SEE OBJECTIVE DATA Goal status: MET 02/03/2023  Tasks of [employer] with RW support >/= 30/56 Baseline: SEE OBJECTIVE DATA Goal status: MET 02/03/2023  Patient ambulates >300' with prosthesis & LRAD independently Baseline: SEE OBJECTIVE DATA  Goal status: NOT MET 02/05/2023  Patient negotiates ramps, curbs & stairs with single rail with prostheses & LRAD modified independent. Baseline: SEE OBJECTIVE DATA Goal status: partially MET 02/05/2023  Patient verbalizes & demonstrates understanding of how to properly  use fitness equipment to return to gym. Baseline: SEE OBJECTIVE DATA Goal status:  MET 02/03/2023  PLAN:  PT FREQUENCY: 2x/week  PT DURATION: 5 weeks  PLANNED INTERVENTIONS: Therapeutic exercises, Therapeutic activity, Neuromuscular re-education, Balance training, Gait training, Patient/Family education, Self Care, Stair training, Prosthetic training, DME instructions, Aquatic Therapy, Re-evaluation, and physical performance testing  PLAN FOR NEXT SESSION:  discharge PT    Vladimir Faster, PT, DPT 02/05/2023, 3:09 PM

## 2023-02-06 ENCOUNTER — Ambulatory Visit (HOSPITAL_COMMUNITY)
Admission: RE | Admit: 2023-02-06 | Discharge: 2023-02-06 | Disposition: A | Discharge status: Home / Self Care | Payer: Medicare Other | Source: Ambulatory Visit | Attending: Nephrology | Admitting: Nephrology

## 2023-02-06 DIAGNOSIS — R188 Other ascites: Secondary | ICD-10-CM | POA: Insufficient documentation

## 2023-02-06 HISTORY — PX: IR PARACENTESIS: IMG2679

## 2023-02-06 MED ORDER — LIDOCAINE HCL 1 % IJ SOLN
20.0000 mL | Freq: Once | INTRAMUSCULAR | Status: AC
  Administered 2023-02-06: 10 mL

## 2023-02-06 MED ORDER — LIDOCAINE HCL 1 % IJ SOLN
INTRAMUSCULAR | Status: AC
  Filled 2023-02-06: qty 20

## 2023-02-07 ENCOUNTER — Encounter
Payer: Medicare Other | Attending: Physical Medicine and Rehabilitation | Admitting: Physical Medicine and Rehabilitation

## 2023-02-07 VITALS — BP 107/57 | HR 44 | Ht 70.0 in | Wt 198.4 lb

## 2023-02-07 DIAGNOSIS — R4189 Other symptoms and signs involving cognitive functions and awareness: Secondary | ICD-10-CM | POA: Diagnosis present

## 2023-02-07 DIAGNOSIS — G4701 Insomnia due to medical condition: Secondary | ICD-10-CM | POA: Diagnosis not present

## 2023-02-07 DIAGNOSIS — G629 Polyneuropathy, unspecified: Secondary | ICD-10-CM | POA: Insufficient documentation

## 2023-02-07 DIAGNOSIS — Z89512 Acquired absence of left leg below knee: Secondary | ICD-10-CM | POA: Diagnosis not present

## 2023-02-07 MED ORDER — MODAFINIL 200 MG PO TABS: 200.0000 mg | ORAL_TABLET | Freq: Every day | ORAL | 3 refills | Status: DC

## 2023-02-07 NOTE — Patient Instructions (Addendum)
Melatonin 3mg  Pistachios and tart cherry juice, chocolate  Daily D3 1,000U

## 2023-02-07 NOTE — Procedures (Signed)
PROCEDURE SUMMARY:  Successful US guided paracentesis from left lateral abdomen.  Yielded 4.2 liters of clear yellow fluid.  No immediate complications.  Patient tolerated well.  EBL = trace  Procedure performed by Buzzy Han PA-C  [facility] S Augusten Lipkin PA-C 02/07/2023 9:20 AM

## 2023-02-07 NOTE — Progress Notes (Signed)
Subjective:    Patient ID: [name], male    DOB: [birth year], 72 y.o.   MRN: [id number]  HPI [name] is a 72 year old man who presents for f/u of BKA and cognitive deficits, daytime somnolence  1) Left BKA -he is starting rehab tues and thurs at Dr. Aud[facility]  -he is working with a Psychologist, educational on mon and wed to work on Primary school teacher  -he would like to keep to keep up with his physical conditioning -he is considering soft shell with an open back that will not put as much pressure on his shin.  -cannot try the new orthotic.  -he can get it in February  -has been ambulating with walker  2) Cognitive deficits -he is awake during the day.  -he asks about MRI results -he has been taking fish oil supplement -his wife notes that cognitive deficits have worsened  3) Neuropathy -he has pain in his right heel- it was present prior to his development of diabetes -it is burning -Qutenza did not help  4) Afib:  -not being treated for afib due to his comorbidities  5) Insomnia -intermittent Gets up at 7:30 -wakes at 2,3,4 -wants to put the leg on because then the day starts for him.  -he is taking 2 pills of lyrica at night  6) Abdominal bloating  7) Diarrhea: -discussed that he is having 5-6 stools per day.   8) Right BKA -healing well  9) Fungal infection of left BKA -discussed that wife suspects ringworm  10) Insomnia: -sleeping at 1 and tries to sleep at 1 -sometimes he never falls asleep    Pain Inventory Average Pain 0 Pain Right Now 0 My pain is intermittent, constant, and stabbing  In the last 24 hours, has pain interfered with the following? General activity 0 Relation with others  0 Enjoyment of life 0 What TIME of day is your pain at its worst? morning , daytime, evening, and night Sleep (in general) Good  Pain is worse with: walking, unsure, and some activites Pain improves with: rest and not wearing the prosthesis  Relief from Meds:  not  taking pain medication   Family History  Problem Relation Age of Onset   Diabetes Mellitus II Mother    Kidney disease Mother    Diabetes Mellitus II Father    CAD Father    Cancer Father        prostate   Kidney disease Father    Diabetes Mellitus II Brother    Kidney disease Brother    Diabetes Mellitus II Brother    Stomach cancer Brother 45   Kidney disease Brother    Colon cancer Neg Hx    Colon polyps Neg Hx    Esophageal cancer Neg Hx    Rectal cancer Neg Hx    Pancreatic cancer Neg Hx    Social History   Socioeconomic History   Marital status: Married    Spouse name: Not on file   Number of children: 2   Years of education: Not on file   Highest education level: Not on file  Occupational History   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  Tobacco Use   Smoking status: Former    Types: Cigars    Start date: 04/08/1973    Quit date: 12/24/1988    Years since quitting: 34.1   Smokeless tobacco: Never   Tobacco comments:    Cigars and Pipe   Vaping Use   Vaping status:  Never Used  Substance and Sexual Activity   Alcohol use: Never   Drug use: Never   Sexual activity: Yes    Partners: Female  Other Topics Concern   Not on file  Social History Narrative   Regular exercise: yes 3 times a week   Caffeine use: hot tea   Social Determinants of Health   Financial Resource Strain: Not on file  Food Insecurity: Patient Declined (07/28/2022)   Hunger Vital Sign    Worried About Running Out of Food in the Last Year: Patient declined    Ran Out of Food in the Last Year: Patient declined  Transportation Needs: No Transportation Needs (07/28/2022)   PRAPARE - Administrator, Civil Service (Medical): No    Lack of Transportation (Non-Medical): No  Physical Activity: Not on file  Stress: Not on file  Social Connections: Unknown (08/21/2021)   Received from [facility]   Social Network    Social Network: Not on file   Past Surgical  History:  Procedure Laterality Date   A-FLUTTER ABLATION N/A 04/06/2020   Procedure: A-FLUTTER ABLATION;  Surgeon: Marinus Maw, MD;  Location: [facility] INVASIVE CV LAB;  Service: Cardiovascular;  Laterality: N/A;   AMPUTATION Left 12/25/2013   Procedure: AMPUTATION RAY LEFT 5TH RAY;  Surgeon: Nadara Mustard, MD;  Location: [facility] ORS;  Service: Orthopedics;  Laterality: Left;   AMPUTATION Right 12/15/2018   Procedure: AMPUTATION OF 4TH AND 5TH TOES RIGHT FOOT;  Surgeon: Nadara Mustard, MD;  Location: [facility] OR;  Service: Orthopedics;  Laterality: Right;   AMPUTATION Left 02/02/2021   Procedure: LEFT FOOT 4TH RAY AMPUTATION;  Surgeon: Nadara Mustard, MD;  Location: [facility] OR;  Service: Orthopedics;  Laterality: Left;   AMPUTATION Left 05/09/2021   Procedure: LEFT BELOW KNEE AMPUTATION;  Surgeon: Nadara Mustard, MD;  Location: [facility] OR;  Service: Orthopedics;  Laterality: Left;   AMPUTATION Right 07/26/2022   Procedure: RIGHT BELOW KNEE AMPUTATION;  Surgeon: Nadara Mustard, MD;  Location: [facility] OR;  Service: Orthopedics;  Laterality: Right;   APPLICATION OF WOUND VAC Right 12/15/2018   Procedure: APPLICATION OF WOUND VAC;  Surgeon: Nadara Mustard, MD;  Location: [facility] OR;  Service: Orthopedics;  Laterality: Right;   AV FISTULA PLACEMENT Left 03/25/2019   Procedure: LEFT ARM ARTERIOVENOUS (AV) FISTULA CREATION;  Surgeon: Nada Libman, MD;  Location: [facility] OR;  Service: Vascular;  Laterality: Left;   BACK SURGERY     BASCILIC VEIN TRANSPOSITION Left 05/20/2019   Procedure: SECOND STAGE LEFT BASCILIC VEIN TRANSPOSITION;  Surgeon: Nada Libman, MD;  Location: [facility] OR;  Service: Vascular;  Laterality: Left;   BIOPSY  06/22/2022   Procedure: BIOPSY;  Surgeon: Lemar Lofty., MD;  Location: [name] ENDOSCOPY;  Service: Gastroenterology;;   CARDIAC CATHETERIZATION  02/17/2014   CHOLECYSTECTOMY     COLONOSCOPY  2011   in [place], Normal   COLONOSCOPY N/A 06/23/2022   Procedure: COLONOSCOPY;  Surgeon: Sherrilyn Rist,  MD;  Location: [facility] ENDOSCOPY;  Service: Gastroenterology;  Laterality: N/A;   CORONARY ARTERY BYPASS GRAFT  2008   CYSTOSCOPY N/A 06/29/2020   Procedure: CYSTOSCOPY WITH CLOT EVACUATION AND  FULGERATION;  Surgeon: Marcine Matar, MD;  Location: [facility] OR;  Service: Urology;  Laterality: N/A;   CYSTOSCOPY WITH FULGERATION Bilateral 06/17/2020   Procedure: CYSTOSCOPY,BILATERAL RETROGRADE, CLOT EVACUATION WITH FULGERATION OF THE BLADDER;  Surgeon: Jannifer Hick, MD;  Location: [facility] OR;  Service: Urology;  Laterality: Bilateral;   CYSTOSCOPY WITH FULGERATION N/A 06/28/2020   Procedure: CYSTOSCOPY WITH CLOT EVACUATION AND FULGERATION OF BLEEDERS;  Surgeon: Marcine Matar, MD;  Location: [facility] OR;  Service: Urology;  Laterality: N/A;  1 HR   ENTEROSCOPY N/A 06/22/2022   Procedure: ENTEROSCOPY;  Surgeon: Meridee Score Netty Starring., MD;  Location: [facility] ENDOSCOPY;  Service: Gastroenterology;  Laterality: N/A;   HEMOSTASIS CLIP PLACEMENT  06/22/2022   Procedure: HEMOSTASIS CLIP PLACEMENT;  Surgeon: Lemar Lofty., MD;  Location: [facility] ENDOSCOPY;  Service: Gastroenterology;;   HOT HEMOSTASIS N/A 06/22/2022   Procedure: HOT HEMOSTASIS (ARGON PLASMA COAGULATION/BICAP);  Surgeon: Lemar Lofty., MD;  Location: [facility] ENDOSCOPY;  Service: Gastroenterology;  Laterality: N/A;   I & D EXTREMITY Right 12/15/2018   Procedure: DEBRIDEMENT RIGHT FOOT;  Surgeon: Nadara Mustard, MD;  Location: [facility] OR;  Service: Orthopedics;  Laterality: Right;   I & D EXTREMITY Right 08/27/2019   Procedure: PARTIAL CUBOID EXCISION RIGHT FOOT;  Surgeon: Nadara Mustard, MD;  Location: [facility] OR;  Service: Orthopedics;  Laterality: Right;   I & D EXTREMITY Right 01/07/2020   Procedure: RIGHT FOOT EXCISION INFECTED BONE;  Surgeon: Nadara Mustard, MD;  Location: [facility] OR;  Service: Orthopedics;  Laterality: Right;   I & D EXTREMITY Right 05/17/2022   Procedure: IRRIGATION AND DEBRIDEMENT RIGHT FOOT ABSCESS;  Surgeon: Nadara Mustard, MD;  Location: [facility] OR;   Service: Orthopedics;  Laterality: Right;   IR PARACENTESIS  10/11/2022   IR PARACENTESIS  12/24/2022   NECK SURGERY     novemver 2019   POLYPECTOMY  06/22/2022   Procedure: POLYPECTOMY;  Surgeon: Meridee Score Netty Starring., MD;  Location: [facility] ENDOSCOPY;  Service: Gastroenterology;;   POLYPECTOMY  06/23/2022   Procedure: POLYPECTOMY;  Surgeon: Sherrilyn Rist, MD;  Location: [facility] ENDOSCOPY;  Service: Gastroenterology;;   STUMP REVISION Left 06/27/2021   Procedure: REVISION LEFT BELOW KNEE AMPUTATION;  Surgeon: Nadara Mustard, MD;  Location: [facility] OR;  Service: Orthopedics;  Laterality: Left;   SUBMUCOSAL LIFTING INJECTION  06/22/2022   Procedure: SUBMUCOSAL LIFTING INJECTION;  Surgeon: Meridee Score Netty Starring., MD;  Location: [facility] ENDOSCOPY;  Service: Gastroenterology;;   SUBMUCOSAL TATTOO INJECTION  06/22/2022   Procedure: SUBMUCOSAL TATTOO INJECTION;  Surgeon: Lemar Lofty., MD;  Location: [facility] ENDOSCOPY;  Service: Gastroenterology;;   TRANSURETHRAL RESECTION OF PROSTATE N/A 06/28/2020   Procedure: TRANSURETHRAL RESECTION OF THE PROSTATE (TURP);  Surgeon: Marcine Matar, MD;  Location: [facility] OR;  Service: Urology;  Laterality: N/A;   WISDOM TOOTH EXTRACTION     Past Medical History:  Diagnosis Date   Acute osteomyelitis of metatarsal bone of left foot (HCC)    Ambulates with cane    straight cane   Anemia    Cervical myelopathy (HCC) 02/06/2018   Chronic kidney disease    dailysis M [facility] F- home   Community acquired pneumonia of left lower lobe of lung 08/18/2021   Complication of anesthesia    Coronary artery disease    Dehiscence of amputation stump of left lower extremity (HCC)    Diabetes mellitus without complication (HCC)    type2   Diabetic foot ulcer (HCC)    Diabetic neuropathy (HCC) 02/06/2018   Dysrhythmia    Gait abnormality 08/24/2018   GERD (gastroesophageal reflux disease)     01/06/20- not current   Gout    History of blood transfusion    History of blood transfusion     History of kidney stones    passed stones  Hypercholesteremia    Hypertension    Hypothyroidism    Neuromuscular disorder (HCC)    neuropathy left leg and bilateral feet   Neuropathy    Partial nontraumatic amputation of foot, 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   PONV (postoperative nausea and vomiting)    Prostate cancer (HCC)    PVD (peripheral vascular disease) (HCC)    with amputations   BP (!) 107/57   Pulse (!) 44   Ht 5\' 10"  (1.778 m)   Wt 198 lb 6.6 oz (90 kg)   SpO2 94%   BMI 28.47 kg/m   Opioid Risk Score:   Fall Risk Score:  `1  Depression screen PHQ 2/9     03/11/2022    1:32 PM 11/02/2021    3:29 PM 09/25/2021   11:00 AM 02/28/2020    3:07 PM 01/10/2020    4:21 PM 06/20/2016    8:18 AM  Depression screen PHQ 2/9  Decreased Interest 0 0 1 0 0 0  Down, Depressed, Hopeless 0 0 3 0 0 0  PHQ - 2 Score 0 0 4 0 0 0  Altered sleeping   3     Tired, decreased energy   3     Change in appetite   3     Feeling bad or failure about yourself    0     Trouble concentrating   3     Moving slowly or fidgety/restless   3     Suicidal thoughts   0     PHQ-9 Score   19       Review of Systems  Constitutional:  Positive for appetite change.       Loss of taste  Musculoskeletal:  Positive for gait problem.  Neurological:  Positive for tremors and numbness.       Spasms  Psychiatric/Behavioral:         Depression  All other systems reviewed and are negative.      Objective:   Physical Exam PRIOR EXAM: Gen: no distress, normal appearing HEENT: oral mucosa pink and moist, NCAT Cardio: Reg rate Chest: normal effort, normal rate of breathing Abd: soft, non-distended Ext: no edema Psych: pleasant, normal affect Skin: intact Neuro: Alert and oriented x3 Musculoskeletal: right foot twitches, missing digits on right foot.      Assessment & Plan:  1) L BKA -encouraged appointment with [employer] for fitting of new prosthetic -continue using walker  2) Cognitive  deficits -discussed that recommended brand of fish oil was Rosita -continue modafinil as he has experienced great improvements with this --discussed MRI results that show mild atrophy and small vessel changes -continue modafinil   3) Fatigue/daytime somnolence -decrease modafinil to 100mg  daily -continue fish oil supplement -continue coffee -start ergocalciferol 50,000U once per week for 20 weeks.  -continue HD at night.  -encouraged lifting weights  -encouraged sunlight exposure   4) Afib -continue fish oil  5) Neuropathy -will not repeat Qutenza since he had burning from this in the past.  -continue lyrica Prescribing Home Zynex NexWave Stimulator Device and supplies as needed. IFC, NMES and TENS medically necessary Treatment Rx: Daily @ 30-40 minutes per treatment PRN. Zynex NexWave only, no substitutions. Treatment Goals: 1) To reduce and/or eliminate pain 2) To improve functional capacity and Activities of daily living 3) To reduce or prevent the need for oral medications 4) To improve circulation in the injured region 5) To decrease or prevent muscle spasm and muscle atrophy 6) To provide a self-management  tool to the patient The patient has not sufficiently improved with conservative care. Numerous studies indexed by Medline and PubMed.gov have shown Neuromuscular, Interferential, and TENS stimulators to reduce pain, improve function, and reduce medication use in injured patients. Continued use of this evidence based, safe, drug free treatment is both reasonable and medically necessary at this time.   6) R BKA -discussed his therapy -discussed goal for 2nd prosthetic -recommended offloading to prevent pressure injury -discussed that he has a nurse at home  7) L BKA ringworm: -trial antifungal   8) Insomnia: -discussed that coreg can decrease melatonin production  9) Frequent stools: -recommended coconut cult  10) Impaired cognition: -discussed that this appears  improved -discussed that he seems more alert  11) Afib: -continue coreg  12) Tremor: -discussed propanolol as an alternative to Coreg if cardiology is ok with that

## 2023-02-10 ENCOUNTER — Encounter: Payer: Medicare Other | Admitting: Physical Therapy

## 2023-02-12 ENCOUNTER — Encounter: Payer: Medicare Other | Admitting: Physical Therapy

## 2023-02-13 LAB — HM DIABETES EYE EXAM

## 2023-02-21 ENCOUNTER — Other Ambulatory Visit: Payer: Self-pay | Admitting: Physical Medicine and Rehabilitation

## 2023-02-21 MED ORDER — MODAFINIL 200 MG PO TABS: 200.0000 mg | ORAL_TABLET | Freq: Every day | ORAL | 3 refills | Status: DC

## 2023-02-28 ENCOUNTER — Encounter (HOSPITAL_COMMUNITY): Payer: Self-pay | Admitting: Nephrology

## 2023-03-03 ENCOUNTER — Other Ambulatory Visit (HOSPITAL_COMMUNITY): Payer: Self-pay | Admitting: Nephrology

## 2023-03-03 DIAGNOSIS — R188 Other ascites: Secondary | ICD-10-CM

## 2023-03-04 ENCOUNTER — Other Ambulatory Visit (HOSPITAL_COMMUNITY): Payer: Self-pay | Admitting: Nephrology

## 2023-03-04 ENCOUNTER — Ambulatory Visit (HOSPITAL_COMMUNITY)
Admission: RE | Admit: 2023-03-04 | Discharge: 2023-03-04 | Disposition: A | Discharge status: Home / Self Care | Payer: Medicare Other | Source: Ambulatory Visit | Attending: Nephrology | Admitting: Nephrology

## 2023-03-04 DIAGNOSIS — R188 Other ascites: Secondary | ICD-10-CM | POA: Insufficient documentation

## 2023-03-04 MED ORDER — LIDOCAINE HCL 1 % IJ SOLN
INTRAMUSCULAR | Status: AC
  Filled 2023-03-04: qty 20

## 2023-03-05 ENCOUNTER — Other Ambulatory Visit: Payer: Self-pay

## 2023-03-05 ENCOUNTER — Ambulatory Visit (HOSPITAL_COMMUNITY)
Admission: AD | Admit: 2023-03-05 | Discharge: 2023-03-05 | Disposition: A | Discharge status: Home / Self Care | Payer: Medicare Other | Source: Ambulatory Visit | Attending: Vascular Surgery | Admitting: Vascular Surgery

## 2023-03-05 ENCOUNTER — Encounter (HOSPITAL_COMMUNITY): Payer: Self-pay | Admitting: Vascular Surgery

## 2023-03-05 ENCOUNTER — Encounter (HOSPITAL_COMMUNITY)
Admission: AD | Disposition: A | Discharge status: Home / Self Care | Payer: Self-pay | Source: Ambulatory Visit | Attending: Vascular Surgery

## 2023-03-05 DIAGNOSIS — Z87891 Personal history of nicotine dependence: Secondary | ICD-10-CM | POA: Diagnosis not present

## 2023-03-05 DIAGNOSIS — E1122 Type 2 diabetes mellitus with diabetic chronic kidney disease: Secondary | ICD-10-CM | POA: Diagnosis not present

## 2023-03-05 DIAGNOSIS — Z8546 Personal history of malignant neoplasm of prostate: Secondary | ICD-10-CM | POA: Insufficient documentation

## 2023-03-05 DIAGNOSIS — T82858A Stenosis of vascular prosthetic devices, implants and grafts, initial encounter: Secondary | ICD-10-CM | POA: Insufficient documentation

## 2023-03-05 DIAGNOSIS — Z7985 Long-term (current) use of injectable non-insulin antidiabetic drugs: Secondary | ICD-10-CM | POA: Insufficient documentation

## 2023-03-05 DIAGNOSIS — Z89512 Acquired absence of left leg below knee: Secondary | ICD-10-CM | POA: Diagnosis not present

## 2023-03-05 DIAGNOSIS — I120 Hypertensive chronic kidney disease with stage 5 chronic kidney disease or end stage renal disease: Secondary | ICD-10-CM | POA: Diagnosis not present

## 2023-03-05 DIAGNOSIS — E1151 Type 2 diabetes mellitus with diabetic peripheral angiopathy without gangrene: Secondary | ICD-10-CM | POA: Insufficient documentation

## 2023-03-05 DIAGNOSIS — Y832 Surgical operation with anastomosis, bypass or graft as the cause of abnormal reaction of the patient, or of later complication, without mention of misadventure at the time of the procedure: Secondary | ICD-10-CM | POA: Diagnosis not present

## 2023-03-05 DIAGNOSIS — N186 End stage renal disease: Secondary | ICD-10-CM | POA: Insufficient documentation

## 2023-03-05 DIAGNOSIS — Z89511 Acquired absence of right leg below knee: Secondary | ICD-10-CM | POA: Insufficient documentation

## 2023-03-05 DIAGNOSIS — Z992 Dependence on renal dialysis: Secondary | ICD-10-CM | POA: Diagnosis not present

## 2023-03-05 HISTORY — PX: PERIPHERAL VASCULAR BALLOON ANGIOPLASTY: CATH118281

## 2023-03-05 HISTORY — PX: A/V FISTULAGRAM: CATH118298

## 2023-03-05 SURGERY — A/V FISTULAGRAM
Anesthesia: LOCAL

## 2023-03-05 MED ORDER — LIDOCAINE HCL (PF) 1 % IJ SOLN
INTRAMUSCULAR | Status: AC
  Filled 2023-03-05: qty 30

## 2023-03-05 MED ORDER — ACETAMINOPHEN 325 MG PO TABS: 650.0000 mg | ORAL_TABLET | ORAL | Status: DC | PRN

## 2023-03-05 MED ORDER — MIDAZOLAM HCL 2 MG/2ML IJ SOLN
INTRAMUSCULAR | Status: AC
  Filled 2023-03-05: qty 2

## 2023-03-05 MED ORDER — HEPARIN (PORCINE) IN NACL 1000-0.9 UT/500ML-% IV SOLN
INTRAVENOUS | Status: DC | PRN
  Administered 2023-03-05 (×2): 500 mL

## 2023-03-05 MED ORDER — SODIUM CHLORIDE 0.9% FLUSH: 3.0000 mL | INTRAVENOUS | Status: DC | PRN

## 2023-03-05 MED ORDER — FENTANYL CITRATE (PF) 100 MCG/2ML IJ SOLN
INTRAMUSCULAR | Status: AC
  Filled 2023-03-05: qty 2

## 2023-03-05 MED ORDER — MIDAZOLAM HCL 2 MG/2ML IJ SOLN
INTRAMUSCULAR | Status: DC | PRN
  Administered 2023-03-05: 1 mg via INTRAVENOUS

## 2023-03-05 MED ORDER — SODIUM CHLORIDE 0.9% FLUSH: 10.0000 mL | Freq: Two times a day (BID) | INTRAVENOUS | Status: DC

## 2023-03-05 MED ORDER — LIDOCAINE HCL (PF) 1 % IJ SOLN
INTRAMUSCULAR | Status: DC | PRN
  Administered 2023-03-05: 5 mL

## 2023-03-05 MED ORDER — FENTANYL CITRATE (PF) 100 MCG/2ML IJ SOLN
INTRAMUSCULAR | Status: DC | PRN
  Administered 2023-03-05: 25 ug via INTRAVENOUS

## 2023-03-05 MED ORDER — ONDANSETRON HCL 4 MG/2ML IJ SOLN: 4.0000 mg | Freq: Four times a day (QID) | INTRAMUSCULAR | Status: DC | PRN

## 2023-03-05 MED ORDER — IODIXANOL 320 MG/ML IV SOLN
INTRAVENOUS | Status: DC | PRN
  Administered 2023-03-05: 20 mL

## 2023-03-05 SURGICAL SUPPLY — 8 items
BALLN ATLAS 14X40X75 (BALLOONS) ×2
BALLOON ATLAS 14X40X75 (BALLOONS) IMPLANT
COVER DOME SNAP 22 D (MISCELLANEOUS) ×2 IMPLANT
SHEATH PINNACLE R/O II 6F 4CM (SHEATH) IMPLANT
SHEATH PINNACLE R/O II 7F 4CM (SHEATH) IMPLANT
SYR MEDALLION 10ML (SYRINGE) IMPLANT
TRAY PV CATH (CUSTOM PROCEDURE TRAY) ×2 IMPLANT
WIRE STARTER BENTSON 035X150 (WIRE) IMPLANT

## 2023-03-05 NOTE — Op Note (Signed)
    Patient name: [name] MRN: [id number] DOB: March 12, 1951 Sex: male  03/05/2023 Pre-operative Diagnosis: ESRD on HD Post-operative diagnosis:  Same Surgeon:  Daria Pastures, MD Assistant: Paulene Floor, MD Procedure Performed:  Access of AV fistula with 18-gauge needle Fistulogram and central venogram Balloon angioplasty of left subclavian vein with 14 mm x 40 mm Atlas balloon 14 minutes moderate sedation   Indications: 72 year old male with multiple medical comorbidities and status post bilateral BKA's with ESRD on HD who is having prolonged bleeding after dialysis sessions.  Risks and benefits of fistulogram with intervention were reviewed, patient expressed understanding and is willing to proceed.  Findings: Widely patent AV fistula.  Approximately 70% stenosis of the left subclavian vein.  Widely patent innominate vein, widely patent SVC   Procedure:  The patient was identified in the holding area and taken to the cath lab  The patient was then placed supine on the table and prepped and draped in the usual sterile fashion.  A time out was called.  The left AV fistula was accessed with an 18-gauge needle through this a Bentson wire was placed and the access was upsized to a 6 [place] sheath.  A fistulogram was then obtained which demonstrated the above findings.  The access was upsized to a 7 [place] sheath and the stenosis was treated with a 14 mm x 40 mm Atlas balloon.  Completion angiography demonstrated an adequate result with approximate 30% restenosis.  The wire and sheath were removed and a nylon stitch was placed for hemostasis.  Contrast: 20 cc  Sedation: 14 minutes moderate sedation  Impression: Sufficient result to balloon angioplasty of left subclavian vein with approximate 30% restenosis.   Daria Pastures MD [facility] Office: [phone]

## 2023-03-05 NOTE — Progress Notes (Signed)
Dr. Juel Burrow was at the bedside to remove tension suture.  Dressing which was removed was clean, dry, and intact prior to removing suture.  Suture was removed and site remained clean, dry and intact.  A new dressing was applied.  Patient will be monitored for 30 minutes prior to transferring to short stay.

## 2023-03-05 NOTE — H&P (Signed)
Chief Complaint: Prolonged bleeding from dialysis access in the left arm night before  Interval H&P  The patient has presented today for an angiogram/ angioplasty; patient is followed at home therapies by Dr. Signe Colt.  Various methods of treatment have been discussed with the patient.  After consideration of risk, benefits and other options for treatment, the patient has consented to a angiogram/ angioplasty with  possible stent placement.   Risks of angiogram with potential angioplasty and stenting if needed.contrast reaction, extravasation/ bleeding, dissection, hypotension and death were explained to the patient.  The patient's history has been reviewed and the patient has been examined, no changes in status.  Stable for angiogram/angioplasty  I have reviewed the patient's chart and labs.  Questions were answered to the patient's satisfaction.  Dialysis Orders: [facility] (Dr. Signe Colt follows)- [facility] - Wed - Fri 3:48hr, BFR 425, EDW 88kg, L AVF, no heparin - Venofer 100mg  IV weekly.  Assessment/Plan: ESRD - home dialysis pt of Dr. Beaulah Corin noting prolonged bleeding last night on treatment; he did receive a full 4hr treatment, usually dialyzes 3x / week. Prolonged bleeding from left BBT - will require a fistulogram with likely angioplasty +/- stent deployment. HTN - restart home regimen. DM with complications PAD s/p b/l BKA's with prosthetics CASHD - stable DM H/o prostate cancer.    HPI: [name] is an 72 y.o. male  cervical melopathy, CASHD, DM, PAD s/p b/l BKA's, diabetic neuropathy, nephrolithiasis, HLD, HTN, hypothyroidism, prostate cancer here with prolonged bleeding in his left BBT. He's had outflow 12mm PTA at the swing segment at [facility].  ROS Per HPI Chemistry and CBC: Creat  Date/Time Value Ref Range Status  02/28/2020 03:22 PM 5.97 (H) 0.70 - 1.18 mg/dL Final    Comment:    For patients >16 years of age, the reference limit for Creatinine is approximately 13% higher for  people identified as African-American. .    Creatinine, Ser  Date/Time Value Ref Range Status  10/11/2022 12:36 PM 7.18 (HH) 0.40 - 1.50 mg/dL Final  62/13/0865 78:46 AM 6.82 (HH) 0.40 - 1.50 mg/dL Final  96/29/5284 13:24 AM 5.03 (H) 0.61 - 1.24 mg/dL Final  40/01/2724 36:64 PM 7.00 (H) 0.61 - 1.24 mg/dL Final  40/34/7425 95:63 PM 7.71 (H) 0.61 - 1.24 mg/dL Final  87/56/4332 95:18 PM 8.78 (H) 0.61 - 1.24 mg/dL Final  84/16/6063 01:60 PM 7.99 (H) 0.61 - 1.24 mg/dL Final  10/93/2355 73:22 PM 8.04 (H) 0.61 - 1.24 mg/dL Final  02/54/2706 23:76 PM 9.13 (H) 0.61 - 1.24 mg/dL Final  28/31/5176 16:07 PM 8.42 (H) 0.61 - 1.24 mg/dL Final  37/01/6268 48:54 AM 7.54 (H) 0.61 - 1.24 mg/dL Final  62/70/3500 93:81 AM 6.50 (H) 0.61 - 1.24 mg/dL Final  82/99/3716 96:78 AM 7.95 (H) 0.61 - 1.24 mg/dL Final  93/81/0175 10:25 AM 8.12 (H) 0.61 - 1.24 mg/dL Final  85/27/7824 23:53 PM 7.55 (H) 0.61 - 1.24 mg/dL Final  61/44/3154 00:86 AM 8.25 (H) 0.61 - 1.24 mg/dL Final  76/19/5093 26:71 AM 6.65 (H) 0.61 - 1.24 mg/dL Final  24/58/0998 33:82 AM 7.81 (H) 0.61 - 1.24 mg/dL Final  50/53/9767 34:19 AM 6.49 (H) 0.61 - 1.24 mg/dL Final  37/90/2409 73:53 AM 9.60 (H) 0.61 - 1.24 mg/dL Final  29/92/4268 34:19 PM 5.89 (HH) 0.40 - 1.50 mg/dL Final  62/22/9798 92:11 AM 8.73 (H) 0.61 - 1.24 mg/dL Final  94/17/4081 44:81 AM 7.65 (H) 0.61 - 1.24 mg/dL Final  85/63/1497 02:63 PM 6.95 (H) 0.61 - 1.24 mg/dL  Final  06/27/2021 08:04 AM 5.90 (H) 0.61 - 1.24 mg/dL Final  81/19/1478 29:56 PM 10.68 (H) 0.61 - 1.24 mg/dL Final    Comment:    DELTA CHECK NOTED  05/21/2021 08:49 PM 7.03 (H) 0.61 - 1.24 mg/dL Final  21/30/8657 84:69 PM 8.56 (H) 0.61 - 1.24 mg/dL Final  62/95/2841 32:44 AM 8.51 (H) 0.61 - 1.24 mg/dL Final  04/10/7251 66:44 AM 10.60 (H) 0.61 - 1.24 mg/dL Final  03/47/4259 56:38 AM 5.35 (H) 0.61 - 1.24 mg/dL Final  75/64/3329 51:88 AM 4.30 (H) 0.61 - 1.24 mg/dL Final  41/66/0630 16:01 AM 4.14 (H) 0.61 - 1.24 mg/dL  Final  09/32/3557 32:20 AM 6.78 (H) 0.61 - 1.24 mg/dL Final  25/42/7062 37:62 AM 6.16 (H) 0.61 - 1.24 mg/dL Final  83/15/1761 60:73 AM 4.73 (H) 0.61 - 1.24 mg/dL Final  71/09/2692 85:46 AM 6.64 (H) 0.61 - 1.24 mg/dL Final  27/06/5007 38:18 PM 5.53 (H) 0.61 - 1.24 mg/dL Final  29/93/7169 67:89 PM 6.00 (H) 0.61 - 1.24 mg/dL Final  38/01/1750 02:58 AM 3.89 (H) 0.61 - 1.24 mg/dL Final  52/77/8242 35:36 AM 6.19 (H) 0.61 - 1.24 mg/dL Final  14/43/1540 08:67 AM 5.25 (H) 0.61 - 1.24 mg/dL Final  61/95/0932 67:12 AM 4.10 (H) 0.61 - 1.24 mg/dL Final  45/80/9983 38:25 AM 5.82 (H) 0.61 - 1.24 mg/dL Final  05/39/7673 41:93 AM 4.01 (H) 0.61 - 1.24 mg/dL Final    Comment:    DELTA CHECK NOTED DIALYSIS   06/28/2020 03:16 PM 2.80 (H) 0.61 - 1.24 mg/dL Final  79/05/4095 35:32 AM 4.83 (H) 0.61 - 1.24 mg/dL Final  99/24/2683 41:96 AM 3.94 (H) 0.61 - 1.24 mg/dL Final  22/29/7989 21:19 AM 5.39 (H) 0.61 - 1.24 mg/dL Final  41/74/0814 48:18 AM 4.61 (H) 0.61 - 1.24 mg/dL Final  56/31/4970 26:37 AM 3.20 (H) 0.61 - 1.24 mg/dL Final    Comment:    DELTA CHECK NOTED  06/23/2020 01:59 AM 4.83 (H) 0.61 - 1.24 mg/dL Final   No results for input(s): "NA", "K", "CL", "CO2", "GLUCOSE", "BUN", "CREATININE", "CALCIUM", "PHOS" in the last 168 hours.  Invalid input(s): "ALB" No results for input(s): "WBC", "NEUTROABS", "HGB", "HCT", "MCV", "PLT" in the last 168 hours. Liver Function Tests: No results for input(s): "AST", "ALT", "ALKPHOS", "BILITOT", "PROT", "ALBUMIN" in the last 168 hours. No results for input(s): "LIPASE", "AMYLASE" in the last 168 hours. No results for input(s): "AMMONIA" in the last 168 hours. Cardiac Enzymes: No results for input(s): "CKTOTAL", "CKMB", "CKMBINDEX", "TROPONINI" in the last 168 hours. Iron Studies: No results for input(s): "IRON", "TIBC", "TRANSFERRIN", "FERRITIN" in the last 72 hours. PT/INR: @LABRCNTIP (inr:5)  Xrays/Other Studies: )No results found. However, due to the size  of the patient record, not all encounters were searched. Please check Results Review for a complete set of results. IR ABDOMEN US LIMITED  Result Date: 03/04/2023 CLINICAL DATA:  History of cirrhosis with recurrent ascites. Request for possible paracentesis today. EXAM: LIMITED ABDOMEN ULTRASOUND FOR ASCITES TECHNIQUE: Limited ultrasound survey for ascites was performed in all four abdominal quadrants. COMPARISON:  None Available. FINDINGS: Limited ultrasound of all 4 quadrants performed. Small volume ascites in the left lower quadrant with prominent active loops of bowel. Fluid amount is insufficient for safe paracentesis today. IMPRESSION: Small volume ascites, insufficient for safe paracentesis. Procedure performed by Brayton El PA-C supervised by Dr. Marliss Coots Electronically Signed   By: Marliss Coots M.D.   On: 03/04/2023 21:20    PMH:   Past Medical History:  Diagnosis Date   Acute osteomyelitis of metatarsal bone of left foot (HCC)    Ambulates with cane    straight cane   Anemia    Cervical myelopathy (HCC) 02/06/2018   Chronic kidney disease    dailysis [facility]- home   Community acquired pneumonia of left lower lobe of lung 08/18/2021   Complication of anesthesia    Coronary artery disease    Dehiscence of amputation stump of left lower extremity (HCC)    Diabetes mellitus without complication (HCC)    type2   Diabetic foot ulcer (HCC)    Diabetic neuropathy (HCC) 02/06/2018   Dysrhythmia    Gait abnormality 08/24/2018   GERD (gastroesophageal reflux disease)     01/06/20- not current   Gout    History of blood transfusion    History of blood transfusion    History of kidney stones    passed stones   Hypercholesteremia    Hypertension    Hypothyroidism    Neuromuscular disorder (HCC)    neuropathy left leg and bilateral feet   Neuropathy    Partial nontraumatic amputation of foot, 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   PONV (postoperative nausea and vomiting)    Prostate cancer  (HCC)    PVD (peripheral vascular disease) (HCC)    with amputations    PSH:   Past Surgical History:  Procedure Laterality Date   A-FLUTTER ABLATION N/A 04/06/2020   Procedure: A-FLUTTER ABLATION;  Surgeon: Marinus Maw, MD;  Location: [facility] INVASIVE CV LAB;  Service: Cardiovascular;  Laterality: N/A;   AMPUTATION Left 12/25/2013   Procedure: AMPUTATION RAY LEFT 5TH RAY;  Surgeon: Nadara Mustard, MD;  Location: [facility] ORS;  Service: Orthopedics;  Laterality: Left;   AMPUTATION Right 12/15/2018   Procedure: AMPUTATION OF 4TH AND 5TH TOES RIGHT FOOT;  Surgeon: Nadara Mustard, MD;  Location: [facility] OR;  Service: Orthopedics;  Laterality: Right;   AMPUTATION Left 02/02/2021   Procedure: LEFT FOOT 4TH RAY AMPUTATION;  Surgeon: Nadara Mustard, MD;  Location: [facility] OR;  Service: Orthopedics;  Laterality: Left;   AMPUTATION Left 05/09/2021   Procedure: LEFT BELOW KNEE AMPUTATION;  Surgeon: Nadara Mustard, MD;  Location: [name] OR;  Service: Orthopedics;  Laterality: Left;   AMPUTATION Right 07/26/2022   Procedure: RIGHT BELOW KNEE AMPUTATION;  Surgeon: Nadara Mustard, MD;  Location: [facility] OR;  Service: Orthopedics;  Laterality: Right;   APPLICATION OF WOUND VAC Right 12/15/2018   Procedure: APPLICATION OF WOUND VAC;  Surgeon: Nadara Mustard, MD;  Location: [facility] OR;  Service: Orthopedics;  Laterality: Right;   AV FISTULA PLACEMENT Left 03/25/2019   Procedure: LEFT ARM ARTERIOVENOUS (AV) FISTULA CREATION;  Surgeon: Nada Libman, MD;  Location: [facility] OR;  Service: Vascular;  Laterality: Left;   BACK SURGERY     BASCILIC VEIN TRANSPOSITION Left 05/20/2019   Procedure: SECOND STAGE LEFT BASCILIC VEIN TRANSPOSITION;  Surgeon: Nada Libman, MD;  Location: [facility] OR;  Service: Vascular;  Laterality: Left;   BIOPSY  06/22/2022   Procedure: BIOPSY;  Surgeon: Lemar Lofty., MD;  Location: [facility] ENDOSCOPY;  Service: Gastroenterology;;   CARDIAC CATHETERIZATION  02/17/2014   CHOLECYSTECTOMY     COLONOSCOPY  2011   in [place], Normal   COLONOSCOPY N/A 06/23/2022   Procedure: COLONOSCOPY;  Surgeon: Sherrilyn Rist, MD;  Location: [facility] ENDOSCOPY;  Service: Gastroenterology;  Laterality: N/A;   CORONARY ARTERY BYPASS GRAFT  2008   CYSTOSCOPY N/A 06/29/2020  Procedure: CYSTOSCOPY WITH CLOT EVACUATION AND  FULGERATION;  Surgeon: Marcine Matar, MD;  Location: [facility] OR;  Service: Urology;  Laterality: N/A;   CYSTOSCOPY WITH FULGERATION Bilateral 06/17/2020   Procedure: CYSTOSCOPY,BILATERAL RETROGRADE, CLOT EVACUATION WITH FULGERATION OF THE BLADDER;  Surgeon: Jannifer Hick, MD;  Location: [facility] OR;  Service: Urology;  Laterality: Bilateral;   CYSTOSCOPY WITH FULGERATION N/A 06/28/2020   Procedure: CYSTOSCOPY WITH CLOT EVACUATION AND FULGERATION OF BLEEDERS;  Surgeon: Marcine Matar, MD;  Location: [facility] OR;  Service: Urology;  Laterality: N/A;  1 HR   ENTEROSCOPY N/A 06/22/2022   Procedure: ENTEROSCOPY;  Surgeon: Meridee Score Netty Starring., MD;  Location: [facility] ENDOSCOPY;  Service: Gastroenterology;  Laterality: N/A;   HEMOSTASIS CLIP PLACEMENT  06/22/2022   Procedure: HEMOSTASIS CLIP PLACEMENT;  Surgeon: Lemar Lofty., MD;  Location: [facility] ENDOSCOPY;  Service: Gastroenterology;;   HOT HEMOSTASIS N/A 06/22/2022   Procedure: HOT HEMOSTASIS (ARGON PLASMA COAGULATION/BICAP);  Surgeon: Lemar Lofty., MD;  Location: [facility] ENDOSCOPY;  Service: Gastroenterology;  Laterality: N/A;   I & D EXTREMITY Right 12/15/2018   Procedure: DEBRIDEMENT RIGHT FOOT;  Surgeon: Nadara Mustard, MD;  Location: [facility] OR;  Service: Orthopedics;  Laterality: Right;   I & D EXTREMITY Right 08/27/2019   Procedure: PARTIAL CUBOID EXCISION RIGHT FOOT;  Surgeon: Nadara Mustard, MD;  Location: [facility] OR;  Service: Orthopedics;  Laterality: Right;   I & D EXTREMITY Right 01/07/2020   Procedure: RIGHT FOOT EXCISION INFECTED BONE;  Surgeon: Nadara Mustard, MD;  Location: [facility] OR;  Service: Orthopedics;  Laterality: Right;   I & D EXTREMITY Right 05/17/2022    Procedure: IRRIGATION AND DEBRIDEMENT RIGHT FOOT ABSCESS;  Surgeon: Nadara Mustard, MD;  Location: [facility] OR;  Service: Orthopedics;  Laterality: Right;   IR PARACENTESIS  10/11/2022   IR PARACENTESIS  12/24/2022   IR PARACENTESIS  02/06/2023   NECK SURGERY     novemver 2019   POLYPECTOMY  06/22/2022   Procedure: POLYPECTOMY;  Surgeon: Meridee Score Netty Starring., MD;  Location: [facility] ENDOSCOPY;  Service: Gastroenterology;;   POLYPECTOMY  06/23/2022   Procedure: POLYPECTOMY;  Surgeon: Sherrilyn Rist, MD;  Location: [facility] ENDOSCOPY;  Service: Gastroenterology;;   STUMP REVISION Left 06/27/2021   Procedure: REVISION LEFT BELOW KNEE AMPUTATION;  Surgeon: Nadara Mustard, MD;  Location[facility] OR;  Service: Orthopedics;  Laterality: Left;   SUBMUCOSAL LIFTING INJECTION  06/22/2022   Procedure: SUBMUCOSAL LIFTING INJECTION;  Surgeon: Meridee Score Netty Starring., MD;  Location: [facility] ENDOSCOPY;  Service: Gastroenterology;;   SUBMUCOSAL TATTOO INJECTION  06/22/2022   Procedure: SUBMUCOSAL TATTOO INJECTION;  Surgeon: Lemar Lofty., MD;  Location: [facility] ENDOSCOPY;  Service: Gastroenterology;;   TRANSURETHRAL RESECTION OF PROSTATE N/A 06/28/2020   Procedure: TRANSURETHRAL RESECTION OF THE PROSTATE (TURP);  Surgeon: Marcine Matar, MD;  Location: [facility] OR;  Service: Urology;  Laterality: N/A;   WISDOM TOOTH EXTRACTION      Allergies:  Allergies  Allergen Reactions   Morphine     Other reaction(s): Delusions (intolerance)   Mushroom Extract Complex Nausea Only    Medications:   Prior to Admission medications   Medication Sig Start Date End Date Taking? Authorizing Provider  allopurinol (ZYLOPRIM) 100 MG tablet Take 2 tablets (200 mg total) by mouth daily. 08/08/22  Yes Angiulli, Mcarthur Rossetti, PA-C  levothyroxine (SYNTHROID) 112 MCG tablet Take 1 tablet (112 mcg total) by mouth daily before breakfast. 05/22/21  Yes Angiulli, Mcarthur Rossetti, PA-C  losartan (COZAAR) 50 MG tablet Take 50 mg  by mouth 2 (two) times daily. 08/22/22  Yes  [provider]  ondansetron (ZOFRAN) 4 MG tablet Take 4 mg by mouth every 6 (six) hours. 08/28/22  Yes [provider]  OZEMPIC, 0.25 OR 0.5 MG/DOSE, 2 MG/3ML SOPN Inject 0.25 mg into the skin once a week. 09/19/21  Yes [provider]  pantoprazole (PROTONIX) 40 MG tablet Take 1 tablet (40 mg total) by mouth 2 (two) times daily before a meal. 11/01/22 10/27/23 Yes Hilarie Fredrickson, MD  pregabalin (LYRICA) 150 MG capsule Take 150 mg by mouth 3 (three) times daily.   Yes [provider]  rosuvastatin (CRESTOR) 10 MG tablet Take 1 tablet (10 mg total) by mouth daily. 08/08/22  Yes Angiulli, Mcarthur Rossetti, PA-C  tiZANidine (ZANAFLEX) 2 MG tablet TAKE 1 TABLET (2MG  TOTAL) BY MOUTH TWICE A DAY AS NEEDED FOR MUSCLE SPASM 01/24/23  Yes Barnie Del R, NP  torsemide (DEMADEX) 20 MG tablet TAKE 1 TABLET BY MOUTH TWICE A DAY 11/29/22  Yes Yates Decamp, MD  acetaminophen (TYLENOL) 325 MG tablet Take 2 tablets (650 mg total) by mouth every 4 (four) hours as needed for fever or mild pain. 08/08/22   Angiulli, Mcarthur Rossetti, PA-C  ferrous sulfate 325 (65 FE) MG tablet Take 325 mg by mouth 2 (two) times daily. 11/25/22   [provider]  gatifloxacin (ZYMAXID) 0.5 % SOLN Place 1 drop into the left eye 4 (four) times daily. 11/19/22   [provider]  ketorolac (ACULAR) 0.5 % ophthalmic solution Place 1 drop into the left eye 4 (four) times daily. 11/20/22   [provider]  Methoxy PEG-Epoetin Beta (MIRCERA IJ) Inject into the skin. 08/20/22   [provider]  modafinil (PROVIGIL) 200 MG tablet Take 1 tablet (200 mg total) by mouth daily. 02/21/23   Raulkar, Drema Pry, MD  propranolol ER (INDERAL LA) 80 MG 24 hr capsule TAKE 1 CAPSULE BY MOUTH EVERY DAY 12/10/22   Yates Decamp, MD  Vitamin D, Ergocalciferol, (DRISDOL) 1.25 MG (50000 UNIT) CAPS capsule Take 1 capsule (50,000 Units total) by mouth every 7 (seven) days. 08/08/22   Angiulli, Mcarthur Rossetti, PA-C    Discontinued Meds:   There are no discontinued medications.  Social History:  reports that he quit smoking about 34 years ago. His smoking use included cigars. He started smoking about 49 years ago. He has never used smokeless tobacco. He reports that he does not drink alcohol and does not use drugs.  Family History:   Family History  Problem Relation Age of Onset   Diabetes Mellitus II Mother    Kidney disease Mother    Diabetes Mellitus II Father    CAD Father    Cancer Father        prostate   Kidney disease Father    Diabetes Mellitus II Brother    Kidney disease Brother    Diabetes Mellitus II Brother    Stomach cancer Brother 61   Kidney disease Brother    Colon cancer Neg Hx    Colon polyps Neg Hx    Esophageal cancer Neg Hx    Rectal cancer Neg Hx    Pancreatic cancer Neg Hx     Blood pressure 137/60, pulse (!) 55, temperature 97.9 F (36.6 C), temperature source Oral, resp. rate 20, height 5\' 11"  (1.803 m), weight 90.7 kg, SpO2 97%. General: Alert male in NAD Heart: RRR, no murmurs, rubs or gallops Lungs: CTA bilaterally, respirations unlabored on RA Abdomen: Soft,  non-distended, +BS Extremities: B/l BKA, no edema noted Dialysis Access:  LUE AVF with clean appearing bandage, + bruit, pulsatile towards the outflow, button holes don't appear infected       Ethelene Hal, MD 03/05/2023, 12:29 PM

## 2023-03-05 NOTE — Progress Notes (Signed)
Patient discharged to home at 1440.  Patient remained stable.  Dressing at site was clean, dry and intact upon discharge.  Discharge instructions were reviewed with the patient and handed to his wife at the entrance.  Wife and patient did not have any questions.

## 2023-03-05 NOTE — Discharge Instructions (Signed)

## 2023-03-12 ENCOUNTER — Telehealth: Payer: Self-pay | Admitting: Orthopedic Surgery

## 2023-03-12 NOTE — Telephone Encounter (Signed)
Patient called and said that he has a small wound on his left leg and it has a white dot in the middle of it. [phone]

## 2023-03-12 NOTE — Telephone Encounter (Signed)
Called pt and appt sch for tomorrow

## 2023-03-13 ENCOUNTER — Ambulatory Visit: Payer: Medicare Other | Admitting: Cardiology

## 2023-03-13 ENCOUNTER — Encounter: Payer: Self-pay | Admitting: Orthopedic Surgery

## 2023-03-13 ENCOUNTER — Ambulatory Visit (INDEPENDENT_AMBULATORY_CARE_PROVIDER_SITE_OTHER): Payer: Medicare Other | Admitting: Orthopedic Surgery

## 2023-03-13 DIAGNOSIS — L97821 Non-pressure chronic ulcer of other part of left lower leg limited to breakdown of skin: Secondary | ICD-10-CM | POA: Diagnosis not present

## 2023-03-13 DIAGNOSIS — Z89511 Acquired absence of right leg below knee: Secondary | ICD-10-CM

## 2023-03-13 DIAGNOSIS — S88111A Complete traumatic amputation at level between knee and ankle, right lower leg, initial encounter: Secondary | ICD-10-CM

## 2023-03-13 DIAGNOSIS — S88112A Complete traumatic amputation at level between knee and ankle, left lower leg, initial encounter: Secondary | ICD-10-CM

## 2023-03-13 DIAGNOSIS — Z89512 Acquired absence of left leg below knee: Secondary | ICD-10-CM

## 2023-03-13 MED ORDER — MUPIROCIN 2 % EX OINT: 1.0000 | TOPICAL_OINTMENT | Freq: Two times a day (BID) | CUTANEOUS | 3 refills | Status: DC

## 2023-03-13 MED ORDER — DOXYCYCLINE HYCLATE 100 MG PO TABS: 100.0000 mg | ORAL_TABLET | Freq: Every day | ORAL | 0 refills | Status: DC

## 2023-03-13 NOTE — Progress Notes (Signed)
Office Visit Note   Patient: [name]           Date of Birth: [birth year]           MRN: [id number] Visit Date: 03/13/2023              Requested by: Adolph Pollack, FNP [id number] [place],  [place] [address] PCP: Adolph Pollack, FNP  Chief Complaint  Patient presents with   Right Leg - Follow-up   Left Leg - Follow-up      HPI: Patient is a 72 year old gentleman who is seen with bilateral transtibial amputations and acute ulcer and cellulitis left patella.  Assessment & Plan: Visit Diagnoses:  1. Below-knee amputation of left lower extremity, initial encounter (HCC)   2. Below-knee amputation of right lower extremity, initial encounter (HCC)   3. Skin ulcer of left knee, limited to breakdown of skin (HCC)     Plan: Patient has a prepatellar bursitis.  Recommended Bactroban topically twice a day and doxycycline daily.  Follow-Up Instructions: Return in about 4 weeks (around 04/10/2023).   Ortho Exam  Patient is alert, oriented, no adenopathy, well-dressed, normal affect, normal respiratory effort. Examination patient has a small abrasion on the right knee but no open wounds.  Left knee patient has cellulitis approximately 4 cm in diameter there is an ulcer over the prepatellar bursa that is 1 mm in diameter.  There is no fluctuation.  The area of cellulitis is tender to palpation.  Imaging: No results found. No images are attached to the encounter.  Labs: Lab Results  Component Value Date   HGBA1C 5.0 05/17/2022   HGBA1C 5.5 05/04/2021   HGBA1C 6.3 (H) 02/02/2021   ESRSEDRATE 34 (H) 01/24/2020   ESRSEDRATE 41 (H) 01/10/2020   ESRSEDRATE 55 (H) 12/27/2019   CRP 8.8 (H) 01/24/2020   CRP 35.9 (H) 01/10/2020   CRP 77.9 (H) 12/27/2019   REPTSTATUS 10/15/2022 FINAL 10/11/2022   GRAMSTAIN  10/11/2022    FEW WBC PRESENT, PREDOMINANTLY MONONUCLEAR NO ORGANISMS SEEN    CULT  10/11/2022    NO GROWTH 3 DAYS Performed at [facility]  Lab, [address], [place], [place] [address]    LABORGA PROTEUS MIRABILIS 05/17/2022   LABORGA ENTEROCOCCUS FAECALIS 05/17/2022   LABORGA STAPHYLOCOCCUS AUREUS 05/17/2022     Lab Results  Component Value Date   ALBUMIN 4.2 10/11/2022   ALBUMIN 4.1 09/09/2022   ALBUMIN 3.4 (L) 08/11/2022   PREALBUMIN 32.7 05/09/2021   PREALBUMIN 14.8 (L) 12/14/2018    Lab Results  Component Value Date   MG 2.0 07/25/2022   MG 1.6 (L) 07/23/2022   MG 2.4 05/14/2021   Lab Results  Component Value Date   VD25OH 42.0 11/19/2021   VD25OH 16.13 (L) 05/09/2021    Lab Results  Component Value Date   PREALBUMIN 32.7 05/09/2021   PREALBUMIN 14.8 (L) 12/14/2018      Latest Ref Rng & Units 10/11/2022   12:36 PM 09/09/2022   11:18 AM 08/10/2022    1:00 PM  CBC EXTENDED  WBC 4.0 - 10.5 K/uL 4.9  4.7  6.7   RBC 4.22 - 5.81 Mil/uL 3.33  3.10  3.11   Hemoglobin 13.0 - 17.0 g/dL 9.7  9.3  9.1   HCT 56.2 - 52.0 % 30.0  28.0  29.5   Platelets 150.0 - 400.0 K/uL 75.0  78.0  127   NEUT# 1.4 - 7.7 K/uL 4.1  3.7  Lymph# 0.7 - 4.0 K/uL 0.3  0.3       There is no height or weight on file to calculate BMI.  Orders:  No orders of the defined types were placed in this encounter.  Meds ordered this encounter  Medications   mupirocin ointment (BACTROBAN) 2 %    Sig: Apply 1 Application topically 2 (two) times daily. Apply to the affected area 2 times a day    Dispense:  22 g    Refill:  3   doxycycline (VIBRA-TABS) 100 MG tablet    Sig: Take 1 tablet (100 mg total) by mouth daily.    Dispense:  20 tablet    Refill:  0     Procedures: No procedures performed  Clinical Data: No additional findings.  ROS:  All other systems negative, except as noted in the HPI. Review of Systems  Objective: Vital Signs: There were no vitals taken for this visit.  Specialty Comments:  No specialty comments available.  PMFS History: Patient Active Problem List   Diagnosis Date Noted   Right below-knee  amputee (HCC) 07/29/2022   Severe sepsis (HCC) 07/23/2022   Foot infection 07/22/2022   Acute respiratory failure (HCC) 07/22/2022   Anemia due to chronic blood loss 06/23/2022   Heme positive stool 06/23/2022   Benign neoplasm of cecum 06/23/2022   Benign neoplasm of transverse colon 06/23/2022   Benign neoplasm of descending colon 06/23/2022   Abscess of right foot 05/17/2022   Contraindication to anticoagulation therapy GU Bleed 03/13/2022   Paroxysmal atrial fibrillation (HCC) 08/18/2021   Anemia of chronic renal failure 08/18/2021   Thrombocytopenia (HCC) 08/18/2021   Acute metabolic encephalopathy 08/18/2021   Action induced myoclonus 08/18/2021   Left below-knee amputee (HCC) 05/11/2021   Symptomatic anemia 09/13/2020   COVID-19 virus infection 09/13/2020   Pressure injury of skin 06/29/2020   Gross hematuria 06/16/2020   Typical atrial flutter (HCC) 03/24/2020   Bilateral pleural effusion 02/24/2020   Healthcare maintenance 01/28/2020   Shortness of breath 01/28/2020   History of partial ray amputation of fourth toe of right foot (HCC) 09/08/2019   Chronic cough 06/25/2019   Cutaneous abscess of right foot    Subacute osteomyelitis, right ankle and foot (HCC) 12/14/2018   AKI (acute kidney injury) (HCC) 12/14/2018   ESRD on dialysis (HCC) 12/14/2018   S/P CABG (coronary artery bypass graft) 12/11/2018   Gait abnormality 08/24/2018   Diabetic neuropathy (HCC) 02/06/2018   Cervical myelopathy (HCC) 02/06/2018   Onychomycosis 10/30/2017   Other spondylosis with radiculopathy, cervical region 01/27/2017   Midfoot ulcer, right, limited to breakdown of skin (HCC) 11/15/2016   Lateral epicondylitis, left elbow 08/12/2016   Prostate cancer (HCC) 06/19/2016   Cellulitis of right foot 12/24/2015   Gout 09/14/2012   Hypertension associated with diabetes (HCC) 09/14/2012   Hypothyroidism 09/14/2012   CAD (coronary artery disease) 09/14/2012   Insulin dependent type 2 diabetes  mellitus (HCC) 09/14/2012   Hyperlipidemia associated with type 2 diabetes mellitus (HCC)    Neuropathy (HCC)    Past Medical History:  Diagnosis Date   Acute osteomyelitis of metatarsal bone of left foot (HCC)    Ambulates with cane    straight cane   Anemia    Cervical myelopathy (HCC) 02/06/2018   Chronic kidney disease    dailysis [facility]- home   Community acquired pneumonia of left lower lobe of lung 08/18/2021   Complication of anesthesia    Coronary artery disease  Dehiscence of amputation stump of left lower extremity (HCC)    Diabetes mellitus without complication (HCC)    type2   Diabetic foot ulcer (HCC)    Diabetic neuropathy (HCC) 02/06/2018   Dysrhythmia    Gait abnormality 08/24/2018   GERD (gastroesophageal reflux disease)     01/06/20- not current   Gout    History of blood transfusion    History of blood transfusion    History of kidney stones    passed stones   Hypercholesteremia    Hypertension    Hypothyroidism    Neuromuscular disorder (HCC)    neuropathy left leg and bilateral feet   Neuropathy    Partial nontraumatic amputation of foot, 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   PONV (postoperative nausea and vomiting)    Prostate cancer (HCC)    PVD (peripheral vascular disease) (HCC)    with amputations    Family History  Problem Relation Age of Onset   Diabetes Mellitus II Mother    Kidney disease Mother    Diabetes Mellitus II Father    CAD Father    Cancer Father        prostate   Kidney disease Father    Diabetes Mellitus II Brother    Kidney disease Brother    Diabetes Mellitus II Brother    Stomach cancer Brother 63   Kidney disease Brother    Colon cancer Neg Hx    Colon polyps Neg Hx    Esophageal cancer Neg Hx    Rectal cancer Neg Hx    Pancreatic cancer Neg Hx     Past Surgical History:  Procedure Laterality Date   A-FLUTTER ABLATION N/A 04/06/2020   Procedure: A-FLUTTER ABLATION;  Surgeon: Marinus Maw, MD;  Location: [facility] INVASIVE CV  LAB;  Service: Cardiovascular;  Laterality: N/A;   A/V FISTULAGRAM N/A 03/05/2023   Procedure: A/V Fistulagram;  Surgeon: Daria Pastures, MD;  Location: [facility] INVASIVE CV LAB;  Service: Vascular;  Laterality: N/A;   AMPUTATION Left 12/25/2013   Procedure: AMPUTATION RAY LEFT 5TH RAY;  Surgeon: Nadara Mustard, MD;  Location: [facility] ORS;  Service: Orthopedics;  Laterality: Left;   AMPUTATION Right 12/15/2018   Procedure: AMPUTATION OF 4TH AND 5TH TOES RIGHT FOOT;  Surgeon: Nadara Mustard, MD;  Location[facility] OR;  Service: Orthopedics;  Laterality: Right;   AMPUTATION Left 02/02/2021   Procedure: LEFT FOOT 4TH RAY AMPUTATION;  Surgeon: Nadara Mustard, MD;  Location: [facility] OR;  Service: Orthopedics;  Laterality: Left;   AMPUTATION Left 05/09/2021   Procedure: LEFT BELOW KNEE AMPUTATION;  Surgeon: Nadara Mustard, MD;  Location: [facility] OR;  Service: Orthopedics;  Laterality: Left;   AMPUTATION Right 07/26/2022   Procedure: RIGHT BELOW KNEE AMPUTATION;  Surgeon: Nadara Mustard, MD;  Location: [facility] OR;  Service: Orthopedics;  Laterality: Right;   APPLICATION OF WOUND VAC Right 12/15/2018   Procedure: APPLICATION OF WOUND VAC;  Surgeon: Nadara Mustard, MD;  Location: [facility] OR;  Service: Orthopedics;  Laterality: Right;   AV FISTULA PLACEMENT Left 03/25/2019   Procedure: LEFT ARM ARTERIOVENOUS (AV) FISTULA CREATION;  Surgeon: Nada Libman, MD;  Location: [facility] OR;  Service: Vascular;  Laterality: Left;   BACK SURGERY     BASCILIC VEIN TRANSPOSITION Left 05/20/2019   Procedure: SECOND STAGE LEFT BASCILIC VEIN TRANSPOSITION;  Surgeon: Nada Libman, MD;  Location: [facility] OR;  Service: Vascular;  Laterality: Left;   BIOPSY  06/22/2022   Procedure: BIOPSY;  Surgeon: Meridee Score,  Netty Starring., MD;  Location: [facility] ENDOSCOPY;  Service: Gastroenterology;;   CARDIAC CATHETERIZATION  02/17/2014   CHOLECYSTECTOMY     COLONOSCOPY  2011   in [place], Normal   COLONOSCOPY N/A 06/23/2022   Procedure: COLONOSCOPY;  Surgeon: Sherrilyn Rist,  MD;  Location: [facility] ENDOSCOPY;  Service: Gastroenterology;  Laterality: N/A;   CORONARY ARTERY BYPASS GRAFT  2008   CYSTOSCOPY N/A 06/29/2020   Procedure: CYSTOSCOPY WITH CLOT EVACUATION AND  FULGERATION;  Surgeon: Marcine Matar, MD;  Location: [facility] OR;  Service: Urology;  Laterality: N/A;   CYSTOSCOPY WITH FULGERATION Bilateral 06/17/2020   Procedure: CYSTOSCOPY,BILATERAL RETROGRADE, CLOT EVACUATION WITH FULGERATION OF THE BLADDER;  Surgeon: Jannifer Hick, MD;  Location: [facility] OR;  Service: Urology;  Laterality: Bilateral;   CYSTOSCOPY WITH FULGERATION N/A 06/28/2020   Procedure: CYSTOSCOPY WITH CLOT EVACUATION AND FULGERATION OF BLEEDERS;  Surgeon: Marcine Matar, MD;  Location: [facility] OR;  Service: Urology;  Laterality: N/A;  1 HR   ENTEROSCOPY N/A 06/22/2022   Procedure: ENTEROSCOPY;  Surgeon: Meridee Score Netty Starring., MD;  Location: [facility] ENDOSCOPY;  Service: Gastroenterology;  Laterality: N/A;   HEMOSTASIS CLIP PLACEMENT  06/22/2022   Procedure: HEMOSTASIS CLIP PLACEMENT;  Surgeon: Lemar Lofty., MD;  Location: [facility] ENDOSCOPY;  Service: Gastroenterology;;   HOT HEMOSTASIS N/A 06/22/2022   Procedure: HOT HEMOSTASIS (ARGON PLASMA COAGULATION/BICAP);  Surgeon: Lemar Lofty., MD;  Location: [facility] ENDOSCOPY;  Service: Gastroenterology;  Laterality: N/A;   I & D EXTREMITY Right 12/15/2018   Procedure: DEBRIDEMENT RIGHT FOOT;  Surgeon: Nadara Mustard, MD;  Location: [facility] OR;  Service: Orthopedics;  Laterality: Right;   I & D EXTREMITY Right 08/27/2019   Procedure: PARTIAL CUBOID EXCISION RIGHT FOOT;  Surgeon: Nadara Mustard, MD;  Location: [facility] OR;  Service: Orthopedics;  Laterality: Right;   I & D EXTREMITY Right 01/07/2020   Procedure: RIGHT FOOT EXCISION INFECTED BONE;  Surgeon: Nadara Mustard, MD;  Location: [facility] OR;  Service: Orthopedics;  Laterality: Right;   I & D EXTREMITY Right 05/17/2022   Procedure: IRRIGATION AND DEBRIDEMENT RIGHT FOOT ABSCESS;  Surgeon: Nadara Mustard, MD;  Location: [facility] OR;   Service: Orthopedics;  Laterality: Right;   IR PARACENTESIS  10/11/2022   IR PARACENTESIS  12/24/2022   IR PARACENTESIS  02/06/2023   NECK SURGERY     novemver 2019   PERIPHERAL VASCULAR BALLOON ANGIOPLASTY Left 03/05/2023   Procedure: PERIPHERAL VASCULAR BALLOON ANGIOPLASTY;  Surgeon: Daria Pastures, MD;  Location: [facility] INVASIVE CV LAB;  Service: Vascular;  Laterality: Left;  AVF   POLYPECTOMY  06/22/2022   Procedure: POLYPECTOMY;  Surgeon: Mansouraty, Netty Starring., MD;  Location:  [facility] ENDOSCOPY;  Service: Gastroenterology;;   POLYPECTOMY  06/23/2022   Procedure: POLYPECTOMY;  Surgeon: Sherrilyn Rist, MD;  Location: [facility] ENDOSCOPY;  Service: Gastroenterology;;   STUMP REVISION Left 06/27/2021   Procedure: REVISION LEFT BELOW KNEE AMPUTATION;  Surgeon: Nadara Mustard, MD;  Location: [facility] OR;  Service: Orthopedics;  Laterality: Left;   SUBMUCOSAL LIFTING INJECTION  06/22/2022   Procedure: SUBMUCOSAL LIFTING INJECTION;  Surgeon: Meridee Score Netty Starring., MD;  Location: [facility] ENDOSCOPY;  Service: Gastroenterology;;   SUBMUCOSAL TATTOO INJECTION  06/22/2022   Procedure: SUBMUCOSAL TATTOO INJECTION;  Surgeon: Lemar Lofty., MD;  Location: [facility] ENDOSCOPY;  Service: Gastroenterology;;   TRANSURETHRAL RESECTION OF PROSTATE N/A 06/28/2020   Procedure: TRANSURETHRAL RESECTION OF THE PROSTATE (TURP);  Surgeon: Marcine Matar, MD;  Location: [facility] OR;  Service: Urology;  Laterality: N/A;  WISDOM TOOTH EXTRACTION     Social History   Occupational History   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  Tobacco Use   Smoking status: Former    Types: Cigars    Start date: 04/08/1973    Quit date: 12/24/1988    Years since quitting: 34.2   Smokeless tobacco: Never   Tobacco comments:    Cigars and Pipe   Vaping Use   Vaping status: Never Used  Substance and Sexual Activity   Alcohol use: Never   Drug use: Never   Sexual activity: Yes    Partners: Female

## 2023-03-18 ENCOUNTER — Telehealth: Payer: Self-pay | Admitting: Orthopedic Surgery

## 2023-03-18 NOTE — Telephone Encounter (Signed)
Patient called. Says his leg is still burning and swollen. Would like to know if Dr. Lajoyce Corners wants to increase his antibiotics? His cb# [phone]

## 2023-03-18 NOTE — Telephone Encounter (Signed)
I called pt to advise he will come in tomorrow at 2:15 with Denny Peon

## 2023-03-18 NOTE — Telephone Encounter (Signed)
You saw this pt last Thursday. Please see message below. Do you want him to come see [name] tomorrow or do something with ABX.

## 2023-03-19 ENCOUNTER — Encounter: Payer: Self-pay | Admitting: Family

## 2023-03-19 ENCOUNTER — Ambulatory Visit (INDEPENDENT_AMBULATORY_CARE_PROVIDER_SITE_OTHER): Payer: Medicare Other | Admitting: Family

## 2023-03-19 DIAGNOSIS — Z89512 Acquired absence of left leg below knee: Secondary | ICD-10-CM | POA: Diagnosis not present

## 2023-03-19 DIAGNOSIS — S88112S Complete traumatic amputation at level between knee and ankle, left lower leg, sequela: Secondary | ICD-10-CM

## 2023-03-19 DIAGNOSIS — S88112A Complete traumatic amputation at level between knee and ankle, left lower leg, initial encounter: Secondary | ICD-10-CM

## 2023-03-19 NOTE — Progress Notes (Signed)
Office Visit Note   Patient: [name]           Date of Birth: December 08, 1950           MRN: [id number] Visit Date: 03/19/2023              Requested by: Adolph Pollack, FNP [id number] [place],  [place] [address] PCP: Adolph Pollack, FNP  Chief Complaint  Patient presents with   Left Knee - Follow-up      HPI: The patient is a 72 year old gentleman who presents today for concern of worsening to the ulcer to his left knee from subsiding into his socket pressure from his prosthesis on the left.  He currently has a clear occlusive dressing in place he wears these dressings during the day while he has his prosthesis on.  He has been removing and letting the wound get air at night.  Is currently completing a course of doxycycline  They were worried about some increased whitish-yellow substance in the wound bed  Assessment & Plan: Visit Diagnoses: No diagnosis found.  Plan: Reassurance provided.  Given some manuka honey at his request.  He may pack with this overnight.  Also given Vashe for wound cleansing discussed possibility of using Vashe soaked dressings for dressings changes  Encouraged to see prosthetists for modifications to socket  Follow-Up Instructions: No follow-ups on file.   Ortho Exam  Patient is alert, oriented, no adenopathy, well-dressed, normal affect, normal respiratory effort. On examination left lower extremity he does have a 2 mm in diameter ulcer over the distal patella from subsiding into his socket there is padding in the area of this wound inside his socket  The ulcer has some mild surrounding erythema without warmth no cellulitis the wound is filled in with fibrinous exudative tissue there is no drainage  Imaging: No results found. No images are attached to the encounter.  Labs: Lab Results  Component Value Date   HGBA1C 5.0 05/17/2022   HGBA1C 5.5 05/04/2021   HGBA1C 6.3 (H) 02/02/2021   ESRSEDRATE 34 (H) 01/24/2020    ESRSEDRATE 41 (H) 01/10/2020   ESRSEDRATE 55 (H) 12/27/2019   CRP 8.8 (H) 01/24/2020   CRP 35.9 (H) 01/10/2020   CRP 77.9 (H) 12/27/2019   REPTSTATUS 10/15/2022 FINAL 10/11/2022   GRAMSTAIN  10/11/2022    FEW WBC PRESENT, PREDOMINANTLY MONONUCLEAR NO ORGANISMS SEEN    CULT  10/11/2022    NO GROWTH 3 DAYS Performed at [facility], [address], [place], [place] [address]    LABORGA PROTEUS MIRABILIS 05/17/2022   LABORGA ENTEROCOCCUS FAECALIS 05/17/2022   LABORGA STAPHYLOCOCCUS AUREUS 05/17/2022     Lab Results  Component Value Date   ALBUMIN 4.2 10/11/2022   ALBUMIN 4.1 09/09/2022   ALBUMIN 3.4 (L) 08/11/2022   PREALBUMIN 32.7 05/09/2021   PREALBUMIN 14.8 (L) 12/14/2018    Lab Results  Component Value Date   MG 2.0 07/25/2022   MG 1.6 (L) 07/23/2022   MG 2.4 05/14/2021   Lab Results  Component Value Date   VD25OH 42.0 11/19/2021   VD25OH 16.13 (L) 05/09/2021    Lab Results  Component Value Date   PREALBUMIN 32.7 05/09/2021   PREALBUMIN 14.8 (L) 12/14/2018      Latest Ref Rng & Units 10/11/2022   12:36 PM 09/09/2022   11:18 AM 08/10/2022    1:00 PM  CBC EXTENDED  WBC 4.0 - 10.5 K/uL 4.9  4.7  6.7  RBC 4.22 - 5.81 Mil/uL 3.33  3.10  3.11   Hemoglobin 13.0 - 17.0 g/dL 9.7  9.3  9.1   HCT 16.1 - 52.0 % 30.0  28.0  29.5   Platelets 150.0 - 400.0 K/uL 75.0  78.0  127   NEUT# 1.4 - 7.7 K/uL 4.1  3.7    Lymph# 0.7 - 4.0 K/uL 0.3  0.3       There is no height or weight on file to calculate BMI.  Orders:  No orders of the defined types were placed in this encounter.  No orders of the defined types were placed in this encounter.    Procedures: No procedures performed  Clinical Data: No additional findings.  ROS:  All other systems negative, except as noted in the HPI. Review of Systems  Objective: Vital Signs: There were no vitals taken for this visit.  Specialty Comments:  No specialty comments available.  PMFS History: Patient Active  Problem List   Diagnosis Date Noted   Right below-knee amputee (HCC) 07/29/2022   Severe sepsis (HCC) 07/23/2022   Foot infection 07/22/2022   Acute respiratory failure (HCC) 07/22/2022   Anemia due to chronic blood loss 06/23/2022   Heme positive stool 06/23/2022   Benign neoplasm of cecum 06/23/2022   Benign neoplasm of transverse colon 06/23/2022   Benign neoplasm of descending colon 06/23/2022   Abscess of right foot 05/17/2022   Contraindication to anticoagulation therapy GU Bleed 03/13/2022   Paroxysmal atrial fibrillation (HCC) 08/18/2021   Anemia of chronic renal failure 08/18/2021   Thrombocytopenia (HCC) 08/18/2021   Acute metabolic encephalopathy 08/18/2021   Action induced myoclonus 08/18/2021   Left below-knee amputee (HCC) 05/11/2021   Symptomatic anemia 09/13/2020   COVID-19 virus infection 09/13/2020   Pressure injury of skin 06/29/2020   Gross hematuria 06/16/2020   Typical atrial flutter (HCC) 03/24/2020   Bilateral pleural effusion 02/24/2020   Healthcare maintenance 01/28/2020   Shortness of breath 01/28/2020   History of partial ray amputation of fourth toe of right foot (HCC) 09/08/2019   Chronic cough 06/25/2019   Cutaneous abscess of right foot    Subacute osteomyelitis, right ankle and foot (HCC) 12/14/2018   AKI (acute kidney injury) (HCC) 12/14/2018   ESRD on dialysis (HCC) 12/14/2018   S/P CABG (coronary artery bypass graft) 12/11/2018   Gait abnormality 08/24/2018   Diabetic neuropathy (HCC) 02/06/2018   Cervical myelopathy (HCC) 02/06/2018   Onychomycosis 10/30/2017   Other spondylosis with radiculopathy, cervical region 01/27/2017   Midfoot ulcer, right, limited to breakdown of skin (HCC) 11/15/2016   Lateral epicondylitis, left elbow 08/12/2016   Prostate cancer (HCC) 06/19/2016   Cellulitis of right foot 12/24/2015   Gout 09/14/2012   Hypertension associated with diabetes (HCC) 09/14/2012   Hypothyroidism 09/14/2012   CAD (coronary artery  disease) 09/14/2012   Insulin dependent type 2 diabetes mellitus (HCC) 09/14/2012   Hyperlipidemia associated with type 2 diabetes mellitus (HCC)    Neuropathy (HCC)    Past Medical History:  Diagnosis Date   Acute osteomyelitis of metatarsal bone of left foot (HCC)    Ambulates with cane    straight cane   Anemia    Cervical myelopathy (HCC) 02/06/2018   Chronic kidney disease    dailysis [facility]- home   Community acquired pneumonia of left lower lobe of lung 08/18/2021   Complication of anesthesia    Coronary artery disease    Dehiscence of amputation stump of left lower extremity (HCC)  Diabetes mellitus without complication (HCC)    type2   Diabetic foot ulcer (HCC)    Diabetic neuropathy (HCC) 02/06/2018   Dysrhythmia    Gait abnormality 08/24/2018   GERD (gastroesophageal reflux disease)     01/06/20- not current   Gout    History of blood transfusion    History of blood transfusion    History of kidney stones    passed stones   Hypercholesteremia    Hypertension    Hypothyroidism    Neuromuscular disorder (HCC)    neuropathy left leg and bilateral feet   Neuropathy    Partial nontraumatic amputation of foot, 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   PONV (postoperative nausea and vomiting)    Prostate cancer (HCC)    PVD (peripheral vascular disease) (HCC)    with amputations    Family History  Problem Relation Age of Onset   Diabetes Mellitus II Mother    Kidney disease Mother    Diabetes Mellitus II Father    CAD Father    Cancer Father        prostate   Kidney disease Father    Diabetes Mellitus II Brother    Kidney disease Brother    Diabetes Mellitus II Brother    Stomach cancer Brother 24   Kidney disease Brother    Colon cancer Neg Hx    Colon polyps Neg Hx    Esophageal cancer Neg Hx    Rectal cancer Neg Hx    Pancreatic cancer Neg Hx     Past Surgical History:  Procedure Laterality Date   A-FLUTTER ABLATION N/A 04/06/2020   Procedure: A-FLUTTER ABLATION;   Surgeon: Marinus Maw, MD;  Location: [facility] INVASIVE CV LAB;  Service: Cardiovascular;  Laterality: N/A;   A/V FISTULAGRAM N/A 03/05/2023   Procedure: A/V Fistulagram;  Surgeon: Daria Pastures, MD;  Location: [facility] INVASIVE CV LAB;  Service: Vascular;  Laterality: N/A;   AMPUTATION Left 12/25/2013   Procedure: AMPUTATION RAY LEFT 5TH RAY;  Surgeon: Nadara Mustard, MD;  Location: [facility] ORS;  Service: Orthopedics;  Laterality: Left;   AMPUTATION Right 12/15/2018   Procedure: AMPUTATION OF 4TH AND 5TH TOES RIGHT FOOT;  Surgeon: Nadara Mustard, MD;  Location: [facility] OR;  Service: Orthopedics;  Laterality: Right;   AMPUTATION Left 02/02/2021   Procedure: LEFT FOOT 4TH RAY AMPUTATION;  Surgeon: Nadara Mustard, MD;  Location: [facility] OR;  Service: Orthopedics;  Laterality: Left;   AMPUTATION Left 05/09/2021   Procedure: LEFT BELOW KNEE AMPUTATION;  Surgeon: Nadara Mustard, MD;  Location: [facility] OR;  Service: Orthopedics;  Laterality: Left;   AMPUTATION Right 07/26/2022   Procedure: RIGHT BELOW KNEE AMPUTATION;  Surgeon: Nadara Mustard, MD;  Location: [facility] OR;  Service: Orthopedics;  Laterality: Right;   APPLICATION OF WOUND VAC Right 12/15/2018   Procedure: APPLICATION OF WOUND VAC;  Surgeon: Nadara Mustard, MD;  Location: [facility] OR;  Service: Orthopedics;  Laterality: Right;   AV FISTULA PLACEMENT Left 03/25/2019   Procedure: LEFT ARM ARTERIOVENOUS (AV) FISTULA CREATION;  Surgeon: Nada Libman, MD;  Location: [facility] OR;  Service: Vascular;  Laterality: Left;   BACK SURGERY     BASCILIC VEIN TRANSPOSITION Left 05/20/2019   Procedure: SECOND STAGE LEFT BASCILIC VEIN TRANSPOSITION;  Surgeon: Nada Libman, MD;  Location: [facility] OR;  Service: Vascular;  Laterality: Left;   BIOPSY  06/22/2022   Procedure: BIOPSY;  Surgeon: Lemar Lofty., MD;  Location: [facility] ENDOSCOPY;  Service: Gastroenterology;;  CARDIAC CATHETERIZATION  02/17/2014   CHOLECYSTECTOMY     COLONOSCOPY  2011   in [place], Normal   COLONOSCOPY N/A 06/23/2022    Procedure: COLONOSCOPY;  Surgeon: Sherrilyn Rist, MD;  Location: [facility] ENDOSCOPY;  Service: Gastroenterology;  Laterality: N/A;   CORONARY ARTERY BYPASS GRAFT  2008   CYSTOSCOPY N/A 06/29/2020   Procedure: CYSTOSCOPY WITH CLOT EVACUATION AND  FULGERATION;  Surgeon: Marcine Matar, MD;  Location: [facility] OR;  Service: Urology;  Laterality: N/A;   CYSTOSCOPY WITH FULGERATION Bilateral 06/17/2020   Procedure: CYSTOSCOPY,BILATERAL RETROGRADE, CLOT EVACUATION WITH FULGERATION OF THE BLADDER;  Surgeon: Jannifer Hick, MD;  Location: [facility] OR;  Service: Urology;  Laterality: Bilateral;   CYSTOSCOPY WITH FULGERATION N/A 06/28/2020   Procedure: CYSTOSCOPY WITH CLOT EVACUATION AND FULGERATION OF BLEEDERS;  Surgeon: Marcine Matar, MD;  Location: [facility] OR;  Service: Urology;  Laterality: N/A;  1 HR   ENTEROSCOPY N/A 06/22/2022   Procedure: ENTEROSCOPY;  Surgeon: Meridee Score Netty Starring., MD;  Location: [facility] ENDOSCOPY;  Service: Gastroenterology;  Laterality: N/A;   HEMOSTASIS CLIP PLACEMENT  06/22/2022   Procedure: HEMOSTASIS CLIP PLACEMENT;  Surgeon: Lemar Lofty., MD;  Location: [facility] ENDOSCOPY;  Service: Gastroenterology;;   HOT HEMOSTASIS N/A 06/22/2022   Procedure: HOT HEMOSTASIS (ARGON PLASMA COAGULATION/BICAP);  Surgeon: Lemar Lofty., MD;  Location: [facility] ENDOSCOPY;  Service: Gastroenterology;  Laterality: N/A;   I & D EXTREMITY Right 12/15/2018   Procedure: DEBRIDEMENT RIGHT FOOT;  Surgeon: Nadara Mustard, MD;  Location: [facility] OR;  Service: Orthopedics;  Laterality: Right;   I & D EXTREMITY Right 08/27/2019   Procedure: PARTIAL CUBOID EXCISION RIGHT FOOT;  Surgeon: Nadara Mustard, MD;  Location: [facility] OR;  Service: Orthopedics;  Laterality: Right;   I & D EXTREMITY Right 01/07/2020   Procedure: RIGHT FOOT EXCISION INFECTED BONE;  Surgeon: Nadara Mustard, MD;  Location: [facility] OR;  Service: Orthopedics;  Laterality: Right;   I & D EXTREMITY Right 05/17/2022   Procedure: IRRIGATION AND DEBRIDEMENT RIGHT FOOT  ABSCESS;  Surgeon: Nadara Mustard, MD;  Location: [facility] OR;  Service: Orthopedics;  Laterality: Right;   IR PARACENTESIS  10/11/2022   IR PARACENTESIS  12/24/2022   IR PARACENTESIS  02/06/2023   NECK SURGERY     novemver 2019   PERIPHERAL VASCULAR BALLOON ANGIOPLASTY Left 03/05/2023   Procedure: PERIPHERAL VASCULAR BALLOON ANGIOPLASTY;  Surgeon: Daria Pastures, MD;  Location: [facility] INVASIVE CV LAB;  Service: Vascular;  Laterality: Left;  AVF   POLYPECTOMY  06/22/2022   Procedure: POLYPECTOMY;  Surgeon: Mansouraty, Netty Starring., MD;  Location: [facility] ENDOSCOPY;  Service: Gastroenterology;;   POLYPECTOMY  06/23/2022   Procedure: POLYPECTOMY;  Surgeon: Sherrilyn Rist, MD;  Location: [facility] ENDOSCOPY;  Service: Gastroenterology;;   STUMP REVISION Left 06/27/2021   Procedure: REVISION LEFT BELOW KNEE AMPUTATION;  Surgeon: Nadara Mustard, MD;  Location: [facility] OR;  Service: Orthopedics;  Laterality: Left;   SUBMUCOSAL LIFTING INJECTION  06/22/2022   Procedure: SUBMUCOSAL LIFTING INJECTION;  Surgeon: Meridee Score Netty Starring., MD;  Location: [facility] ENDOSCOPY;  Service: Gastroenterology;;   SUBMUCOSAL TATTOO INJECTION  06/22/2022   Procedure: SUBMUCOSAL TATTOO INJECTION;  Surgeon: Lemar Lofty., MD;  Location: [facility] ENDOSCOPY;  Service: Gastroenterology;;   TRANSURETHRAL RESECTION OF PROSTATE N/A 06/28/2020   Procedure: TRANSURETHRAL RESECTION OF THE PROSTATE (TURP);  Surgeon: Marcine Matar, MD;  Location: [facility] OR;  Service: Urology;  Laterality: N/A;   WISDOM TOOTH EXTRACTION     Social History  Occupational History   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  Tobacco Use   Smoking status: Former    Types: Cigars    Start date: 04/08/1973    Quit date: 12/24/1988    Years since quitting: 34.2   Smokeless tobacco: Never   Tobacco comments:    Cigars and Pipe   Vaping Use   Vaping status: Never Used  Substance and Sexual Activity   Alcohol use: Never   Drug use: Never   Sexual activity: Yes    Partners:  Female

## 2023-03-20 ENCOUNTER — Ambulatory Visit (HOSPITAL_COMMUNITY)
Admission: RE | Admit: 2023-03-20 | Discharge: 2023-03-20 | Disposition: A | Discharge status: Home / Self Care | Payer: Medicare Other | Source: Ambulatory Visit | Attending: Nephrology | Admitting: Nephrology

## 2023-03-20 ENCOUNTER — Other Ambulatory Visit (HOSPITAL_COMMUNITY): Payer: Self-pay | Admitting: Nephrology

## 2023-03-20 DIAGNOSIS — R188 Other ascites: Secondary | ICD-10-CM | POA: Insufficient documentation

## 2023-03-20 MED ORDER — LIDOCAINE HCL 1 % IJ SOLN
INTRAMUSCULAR | Status: AC
  Filled 2023-03-20: qty 20

## 2023-04-10 ENCOUNTER — Ambulatory Visit: Payer: Medicare Other | Admitting: Orthopedic Surgery

## 2023-04-26 ENCOUNTER — Other Ambulatory Visit: Payer: Self-pay | Admitting: Internal Medicine

## 2023-05-04 ENCOUNTER — Other Ambulatory Visit: Payer: Self-pay

## 2023-05-04 ENCOUNTER — Encounter (HOSPITAL_BASED_OUTPATIENT_CLINIC_OR_DEPARTMENT_OTHER): Payer: Self-pay | Admitting: Emergency Medicine

## 2023-05-04 ENCOUNTER — Emergency Department (HOSPITAL_BASED_OUTPATIENT_CLINIC_OR_DEPARTMENT_OTHER): Payer: Medicare Other

## 2023-05-04 ENCOUNTER — Inpatient Hospital Stay (HOSPITAL_BASED_OUTPATIENT_CLINIC_OR_DEPARTMENT_OTHER)
Admission: EM | Admit: 2023-05-04 | Discharge: 2023-05-06 | DRG: 871 | Disposition: A | Discharge status: Home / Self Care | Payer: Medicare Other | Attending: Internal Medicine | Admitting: Internal Medicine

## 2023-05-04 DIAGNOSIS — K567 Ileus, unspecified: Secondary | ICD-10-CM | POA: Diagnosis present

## 2023-05-04 DIAGNOSIS — Z800 Family history of malignant neoplasm of digestive organs: Secondary | ICD-10-CM

## 2023-05-04 DIAGNOSIS — N186 End stage renal disease: Secondary | ICD-10-CM

## 2023-05-04 DIAGNOSIS — D696 Thrombocytopenia, unspecified: Secondary | ICD-10-CM | POA: Diagnosis present

## 2023-05-04 DIAGNOSIS — J9601 Acute respiratory failure with hypoxia: Secondary | ICD-10-CM | POA: Diagnosis present

## 2023-05-04 DIAGNOSIS — J189 Pneumonia, unspecified organism: Principal | ICD-10-CM | POA: Diagnosis present

## 2023-05-04 DIAGNOSIS — E8770 Fluid overload, unspecified: Secondary | ICD-10-CM | POA: Diagnosis present

## 2023-05-04 DIAGNOSIS — E871 Hypo-osmolality and hyponatremia: Secondary | ICD-10-CM | POA: Diagnosis present

## 2023-05-04 DIAGNOSIS — Z992 Dependence on renal dialysis: Secondary | ICD-10-CM

## 2023-05-04 DIAGNOSIS — Z9889 Other specified postprocedural states: Secondary | ICD-10-CM

## 2023-05-04 DIAGNOSIS — Z951 Presence of aortocoronary bypass graft: Secondary | ICD-10-CM

## 2023-05-04 DIAGNOSIS — Z79899 Other long term (current) drug therapy: Secondary | ICD-10-CM

## 2023-05-04 DIAGNOSIS — Z89511 Acquired absence of right leg below knee: Secondary | ICD-10-CM

## 2023-05-04 DIAGNOSIS — Z87442 Personal history of urinary calculi: Secondary | ICD-10-CM

## 2023-05-04 DIAGNOSIS — G629 Polyneuropathy, unspecified: Secondary | ICD-10-CM

## 2023-05-04 DIAGNOSIS — R053 Chronic cough: Secondary | ICD-10-CM | POA: Diagnosis present

## 2023-05-04 DIAGNOSIS — I4821 Permanent atrial fibrillation: Secondary | ICD-10-CM | POA: Diagnosis present

## 2023-05-04 DIAGNOSIS — Z9049 Acquired absence of other specified parts of digestive tract: Secondary | ICD-10-CM

## 2023-05-04 DIAGNOSIS — R0902 Hypoxemia: Secondary | ICD-10-CM | POA: Diagnosis not present

## 2023-05-04 DIAGNOSIS — I2510 Atherosclerotic heart disease of native coronary artery without angina pectoris: Secondary | ICD-10-CM | POA: Diagnosis present

## 2023-05-04 DIAGNOSIS — Z79890 Hormone replacement therapy (postmenopausal): Secondary | ICD-10-CM

## 2023-05-04 DIAGNOSIS — D638 Anemia in other chronic diseases classified elsewhere: Secondary | ICD-10-CM | POA: Diagnosis present

## 2023-05-04 DIAGNOSIS — K7460 Unspecified cirrhosis of liver: Secondary | ICD-10-CM | POA: Diagnosis present

## 2023-05-04 DIAGNOSIS — I120 Hypertensive chronic kidney disease with stage 5 chronic kidney disease or end stage renal disease: Secondary | ICD-10-CM | POA: Diagnosis present

## 2023-05-04 DIAGNOSIS — N2581 Secondary hyperparathyroidism of renal origin: Secondary | ICD-10-CM | POA: Diagnosis present

## 2023-05-04 DIAGNOSIS — I10 Essential (primary) hypertension: Secondary | ICD-10-CM | POA: Diagnosis present

## 2023-05-04 DIAGNOSIS — E7800 Pure hypercholesterolemia, unspecified: Secondary | ICD-10-CM | POA: Diagnosis present

## 2023-05-04 DIAGNOSIS — Z9079 Acquired absence of other genital organ(s): Secondary | ICD-10-CM

## 2023-05-04 DIAGNOSIS — Z91018 Allergy to other foods: Secondary | ICD-10-CM

## 2023-05-04 DIAGNOSIS — Z862 Personal history of diseases of the blood and blood-forming organs and certain disorders involving the immune mechanism: Secondary | ICD-10-CM

## 2023-05-04 DIAGNOSIS — Z833 Family history of diabetes mellitus: Secondary | ICD-10-CM

## 2023-05-04 DIAGNOSIS — E1122 Type 2 diabetes mellitus with diabetic chronic kidney disease: Secondary | ICD-10-CM | POA: Diagnosis present

## 2023-05-04 DIAGNOSIS — Z885 Allergy status to narcotic agent status: Secondary | ICD-10-CM

## 2023-05-04 DIAGNOSIS — A419 Sepsis, unspecified organism: Principal | ICD-10-CM | POA: Diagnosis present

## 2023-05-04 DIAGNOSIS — Z87891 Personal history of nicotine dependence: Secondary | ICD-10-CM

## 2023-05-04 DIAGNOSIS — M109 Gout, unspecified: Secondary | ICD-10-CM | POA: Diagnosis present

## 2023-05-04 DIAGNOSIS — D631 Anemia in chronic kidney disease: Secondary | ICD-10-CM | POA: Diagnosis present

## 2023-05-04 DIAGNOSIS — Z8042 Family history of malignant neoplasm of prostate: Secondary | ICD-10-CM

## 2023-05-04 DIAGNOSIS — Z7985 Long-term (current) use of injectable non-insulin antidiabetic drugs: Secondary | ICD-10-CM

## 2023-05-04 DIAGNOSIS — Z8546 Personal history of malignant neoplasm of prostate: Secondary | ICD-10-CM

## 2023-05-04 DIAGNOSIS — E1151 Type 2 diabetes mellitus with diabetic peripheral angiopathy without gangrene: Secondary | ICD-10-CM | POA: Diagnosis present

## 2023-05-04 DIAGNOSIS — G959 Disease of spinal cord, unspecified: Secondary | ICD-10-CM | POA: Diagnosis present

## 2023-05-04 DIAGNOSIS — E785 Hyperlipidemia, unspecified: Secondary | ICD-10-CM | POA: Diagnosis present

## 2023-05-04 DIAGNOSIS — E1141 Type 2 diabetes mellitus with diabetic mononeuropathy: Secondary | ICD-10-CM | POA: Diagnosis present

## 2023-05-04 DIAGNOSIS — Z8249 Family history of ischemic heart disease and other diseases of the circulatory system: Secondary | ICD-10-CM

## 2023-05-04 DIAGNOSIS — K219 Gastro-esophageal reflux disease without esophagitis: Secondary | ICD-10-CM | POA: Diagnosis present

## 2023-05-04 DIAGNOSIS — E1169 Type 2 diabetes mellitus with other specified complication: Secondary | ICD-10-CM | POA: Diagnosis present

## 2023-05-04 DIAGNOSIS — E039 Hypothyroidism, unspecified: Secondary | ICD-10-CM | POA: Diagnosis present

## 2023-05-04 DIAGNOSIS — Z8719 Personal history of other diseases of the digestive system: Secondary | ICD-10-CM

## 2023-05-04 DIAGNOSIS — Z89512 Acquired absence of left leg below knee: Secondary | ICD-10-CM

## 2023-05-04 DIAGNOSIS — R188 Other ascites: Secondary | ICD-10-CM | POA: Diagnosis present

## 2023-05-04 DIAGNOSIS — I4892 Unspecified atrial flutter: Secondary | ICD-10-CM | POA: Diagnosis present

## 2023-05-04 DIAGNOSIS — Z841 Family history of disorders of kidney and ureter: Secondary | ICD-10-CM

## 2023-05-04 LAB — PROTIME-INR
INR: 1.4 — ABNORMAL HIGH (ref 0.8–1.2)
Prothrombin Time: 17 s — ABNORMAL HIGH (ref 11.4–15.2)

## 2023-05-04 LAB — CBC WITH DIFFERENTIAL/PLATELET
Abs Immature Granulocytes: 0.05 10*3/uL (ref 0.00–0.07)
Basophils Absolute: 0.1 10*3/uL (ref 0.0–0.1)
Basophils Relative: 1 %
Eosinophils Absolute: 0 10*3/uL (ref 0.0–0.5)
Eosinophils Relative: 0 %
HCT: 34.1 % — ABNORMAL LOW (ref 39.0–52.0)
Hemoglobin: 10.6 g/dL — ABNORMAL LOW (ref 13.0–17.0)
Immature Granulocytes: 1 %
Lymphocytes Relative: 3 %
Lymphs Abs: 0.3 10*3/uL — ABNORMAL LOW (ref 0.7–4.0)
MCH: 27.4 pg (ref 26.0–34.0)
MCHC: 31.1 g/dL (ref 30.0–36.0)
MCV: 88.1 fL (ref 80.0–100.0)
Monocytes Absolute: 0.7 10*3/uL (ref 0.1–1.0)
Monocytes Relative: 7 %
Neutro Abs: 8.3 10*3/uL — ABNORMAL HIGH (ref 1.7–7.7)
Neutrophils Relative %: 88 %
Platelets: 119 10*3/uL — ABNORMAL LOW (ref 150–400)
RBC: 3.87 MIL/uL — ABNORMAL LOW (ref 4.22–5.81)
RDW: 19 % — ABNORMAL HIGH (ref 11.5–15.5)
WBC: 9.4 10*3/uL (ref 4.0–10.5)
nRBC: 0 % (ref 0.0–0.2)

## 2023-05-04 LAB — COMPREHENSIVE METABOLIC PANEL
ALT: 6 U/L (ref 0–44)
AST: 13 U/L — ABNORMAL LOW (ref 15–41)
Albumin: 4.5 g/dL (ref 3.5–5.0)
Alkaline Phosphatase: 145 U/L — ABNORMAL HIGH (ref 38–126)
Anion gap: 15 (ref 5–15)
BUN: 49 mg/dL — ABNORMAL HIGH (ref 8–23)
CO2: 27 mmol/L (ref 22–32)
Calcium: 9.3 mg/dL (ref 8.9–10.3)
Chloride: 89 mmol/L — ABNORMAL LOW (ref 98–111)
Creatinine, Ser: 5.81 mg/dL — ABNORMAL HIGH (ref 0.61–1.24)
GFR, Estimated: 10 mL/min — ABNORMAL LOW (ref >60)
Glucose, Bld: 79 mg/dL (ref 70–99)
Potassium: 5 mmol/L (ref 3.5–5.1)
Sodium: 131 mmol/L — ABNORMAL LOW (ref 135–145)
Total Bilirubin: 1.9 mg/dL — ABNORMAL HIGH (ref 0.0–1.2)
Total Protein: 6.7 g/dL (ref 6.5–8.1)

## 2023-05-04 LAB — RESP PANEL BY RT-PCR (RSV, FLU A&B, COVID)  RVPGX2
Influenza A by PCR: NEGATIVE
Influenza B by PCR: NEGATIVE
Resp Syncytial Virus by PCR: NEGATIVE
SARS Coronavirus 2 by RT PCR: NEGATIVE

## 2023-05-04 LAB — LACTIC ACID, PLASMA
Lactic Acid, Venous: 1.8 mmol/L (ref 0.5–1.9)
Lactic Acid, Venous: 1.1 mmol/L (ref 0.5–1.9)

## 2023-05-04 LAB — APTT: aPTT: 40 s — ABNORMAL HIGH (ref 24–36)

## 2023-05-04 MED ORDER — SODIUM CHLORIDE 0.9 % IV BOLUS (SEPSIS)
1000.0000 mL | Freq: Once | INTRAVENOUS | Status: AC
  Administered 2023-05-04: 1000 mL via INTRAVENOUS

## 2023-05-04 MED ORDER — ACETAMINOPHEN 325 MG PO TABS
650.0000 mg | ORAL_TABLET | Freq: Once | ORAL | Status: AC
  Administered 2023-05-04: 650 mg via ORAL
  Filled 2023-05-04: qty 2

## 2023-05-04 MED ORDER — ACETAMINOPHEN 325 MG PO TABS
650.0000 mg | ORAL_TABLET | Freq: Four times a day (QID) | ORAL | Status: DC | PRN
  Administered 2023-05-04: 650 mg via ORAL
  Filled 2023-05-04: qty 2

## 2023-05-04 MED ORDER — LACTATED RINGERS IV SOLN: INTRAVENOUS | Status: AC

## 2023-05-04 MED ORDER — SODIUM CHLORIDE 0.9 % IV SOLN
500.0000 mg | Freq: Once | INTRAVENOUS | Status: AC
Start: 2023-05-04 — End: 2023-05-04
  Administered 2023-05-04: 500 mg via INTRAVENOUS
  Filled 2023-05-04: qty 5

## 2023-05-04 MED ORDER — SODIUM CHLORIDE 0.9 % IV SOLN
2.0000 g | Freq: Once | INTRAVENOUS | Status: AC
  Administered 2023-05-04: 2 g via INTRAVENOUS
  Filled 2023-05-04: qty 20

## 2023-05-04 NOTE — ED Triage Notes (Signed)
Pt wife states his has been confused and lethargic all day

## 2023-05-04 NOTE — ED Notes (Signed)
Provider again informed of not able to urinate... Provider okay with holding on urine.Marland KitchenMarland Kitchen

## 2023-05-04 NOTE — ED Notes (Signed)
RT in triage.

## 2023-05-04 NOTE — ED Provider Notes (Signed)
[place] EMERGENCY DEPARTMENT AT [facility] Provider Note   CSN: [id number] Arrival date & time: 05/04/23  1450     History  Chief Complaint  Patient presents with   Altered Mental Status    [name] is a 73 y.o. male history of ESRD on dialysis, hypertension, bilateral BKA here presenting with altered mental status and hypoxia.  Patient woke up this morning and was yelling in pain.  Patient was complaining of cough and trouble breathing.  Wife got him in the car and brought him over.  She states that he felt very warm.  He denies any sick contacts.  Patient was noted to be febrile and tachycardic and hypoxic in triage.  Patient is not on oxygen at baseline.  Of note, patient is on dialysis at home.  Patient also has increased abdominal distention and states that he has history of cirrhosis and usually require paracentesis periodically.  The history is provided by the patient and the spouse.       Home Medications Prior to Admission medications   Medication Sig Start Date End Date Taking? Authorizing Provider  acetaminophen (TYLENOL) 325 MG tablet Take 2 tablets (650 mg total) by mouth every 4 (four) hours as needed for fever or mild pain. 08/08/22   Angiulli, Mcarthur Rossetti, PA-C  allopurinol (ZYLOPRIM) 100 MG tablet Take 2 tablets (200 mg total) by mouth daily. 08/08/22   Angiulli, Mcarthur Rossetti, PA-C  doxycycline (VIBRA-TABS) 100 MG tablet Take 1 tablet (100 mg total) by mouth daily. 03/13/23   Nadara Mustard, MD  ferrous sulfate 325 (65 FE) MG tablet Take 325 mg by mouth 2 (two) times daily. 11/25/22   [provider]  levothyroxine (SYNTHROID) 112 MCG tablet Take 1 tablet (112 mcg total) by mouth daily before breakfast. 05/22/21   Angiulli, Mcarthur Rossetti, PA-C  losartan (COZAAR) 50 MG tablet Take 50 mg by mouth 2 (two) times daily. 08/22/22   [provider]  Methoxy PEG-Epoetin Beta (MIRCERA IJ) Inject into the skin. 08/20/22   [provider]  modafinil  (PROVIGIL) 200 MG tablet Take 1 tablet (200 mg total) by mouth daily. 02/21/23   Raulkar, Drema Pry, MD  mupirocin ointment (BACTROBAN) 2 % Apply 1 Application topically 2 (two) times daily. Apply to the affected area 2 times a day 03/13/23   Nadara Mustard, MD  ondansetron (ZOFRAN) 4 MG tablet Take 4 mg by mouth every 6 (six) hours. 08/28/22   [provider]  OZEMPIC, 0.25 OR 0.5 MG/DOSE, 2 MG/3ML SOPN Inject 0.25 mg into the skin once a week. 09/19/21   [provider]  pantoprazole (PROTONIX) 40 MG tablet TAKE 1 TABLET BY MOUTH EVERY DAY 04/28/23   Hilarie Fredrickson, MD  pregabalin (LYRICA) 150 MG capsule Take 150 mg by mouth 3 (three) times daily.    [provider]  propranolol ER (INDERAL LA) 80 MG 24 hr capsule TAKE 1 CAPSULE BY MOUTH EVERY DAY 12/10/22   Yates Decamp, MD  rosuvastatin (CRESTOR) 10 MG tablet Take 1 tablet (10 mg total) by mouth daily. 08/08/22   Angiulli, Mcarthur Rossetti, PA-C  tiZANidine (ZANAFLEX) 2 MG tablet TAKE 1 TABLET (2MG  TOTAL) BY MOUTH TWICE A DAY AS NEEDED FOR MUSCLE SPASM 01/24/23   Adonis Huguenin, NP  torsemide (DEMADEX) 20 MG tablet TAKE 1 TABLET BY MOUTH TWICE A DAY 11/29/22   Yates Decamp, MD  Vitamin D, Ergocalciferol, (DRISDOL) 1.25 MG (50000 UNIT) CAPS capsule Take 1 capsule (50,000  Units total) by mouth every 7 (seven) days. 08/08/22   Angiulli, Mcarthur Rossetti, PA-C      Allergies    Morphine and Mushroom extract complex (do not select)    Review of Systems   Review of Systems  Constitutional:  Positive for fever.  Respiratory:  Positive for shortness of breath.   All other systems reviewed and are negative.   Physical Exam Updated Vital Signs BP (!) 140/65   Pulse (!) 58   Temp (!) 100.5 F (38.1 C) (Axillary)   Resp 18   SpO2 100%  Physical Exam Vitals and nursing note reviewed.  Constitutional:      Comments: Confused  HENT:     Head: Normocephalic.     Nose: Nose normal.     Mouth/Throat:     Mouth: Mucous membranes are dry.  Eyes:      Extraocular Movements: Extraocular movements intact.     Pupils: Pupils are equal, round, and reactive to light.  Cardiovascular:     Rate and Rhythm: Normal rate and regular rhythm.     Pulses: Normal pulses.  Pulmonary:     Comments: Crackles bilateral bases Abdominal:     General: Abdomen is flat.     Palpations: Abdomen is soft.  Musculoskeletal:     Cervical back: Normal range of motion and neck supple.     Comments: Bilateral BKA  Skin:    General: Skin is warm.     Capillary Refill: Capillary refill takes less than 2 seconds.  Neurological:     Comments: Confused and A & O x 2. Moving all extremities   Psychiatric:        Mood and Affect: Mood normal.        Behavior: Behavior normal.     ED Results / Procedures / Treatments   Labs (all labs ordered are listed, but only abnormal results are displayed) Labs Reviewed  COMPREHENSIVE METABOLIC PANEL - Abnormal; Notable for the following components:      Result Value   Sodium 131 (*)    Chloride 89 (*)    BUN 49 (*)    Creatinine, Ser 5.81 (*)    AST 13 (*)    Alkaline Phosphatase 145 (*)    Total Bilirubin 1.9 (*)    GFR, Estimated 10 (*)    All other components within normal limits  CBC WITH DIFFERENTIAL/PLATELET - Abnormal; Notable for the following components:   RBC 3.87 (*)    Hemoglobin 10.6 (*)    HCT 34.1 (*)    RDW 19.0 (*)    Platelets 119 (*)    Neutro Abs 8.3 (*)    Lymphs Abs 0.3 (*)    All other components within normal limits  PROTIME-INR - Abnormal; Notable for the following components:   Prothrombin Time 17.0 (*)    INR 1.4 (*)    All other components within normal limits  APTT - Abnormal; Notable for the following components:   aPTT 40 (*)    All other components within normal limits  RESP PANEL BY RT-PCR (RSV, FLU A&B, COVID)  [id number]  CULTURE, BLOOD (ROUTINE X 2)  CULTURE, BLOOD (ROUTINE X 2)  RESPIRATORY PANEL BY PCR  LACTIC ACID, PLASMA  LACTIC ACID, PLASMA  URINALYSIS, W/  REFLEX TO CULTURE (INFECTION SUSPECTED)    EKG None  Radiology CT CHEST ABDOMEN PELVIS WO CONTRAST Result Date: 05/04/2023 CLINICAL DATA:  Sepsis fever, cough, hx of CKD and ascites EXAM: CT CHEST, ABDOMEN AND  PELVIS WITHOUT CONTRAST TECHNIQUE: Multidetector CT imaging of the chest, abdomen and pelvis was performed following the standard protocol without IV contrast. RADIATION DOSE REDUCTION: This exam was performed according to the departmental dose-optimization program which includes automated exposure control, adjustment of the mA and/or kV according to patient size and/or use of iterative reconstruction technique. COMPARISON:  Radiograph earlier today.  Abdominopelvic CT 10/08/2022 FINDINGS: CT CHEST FINDINGS Cardiovascular: The heart is mildly enlarged. Post CABG with calcification of native coronary arteries. Aortic atherosclerosis without aneurysm. Dilated main pulmonary artery at 3.6 cm. No pericardial effusion. Mediastinum/Nodes: No mediastinal adenopathy. Unremarkable appearance of the esophagus. Lungs/Pleura: Patchy and nodular airspace disease in the lingula and left greater than right lower lobes. Breathing motion artifact limits detailed parenchymal assessment. No pleural fluid. No convincing debris in the trachea or central airways. Musculoskeletal: Prior median sternotomy. Diffuse thoracic spondylosis with spurring. There are no acute or suspicious osseous abnormalities. CT ABDOMEN PELVIS FINDINGS Hepatobiliary: Nodular hepatic contours suspicious for cirrhosis. Tiny subcentimeter hypodensity in the right lobe of the liver, series 2, image 59, unchanged. A second subcentimeter hypodensity on prior is not definitively seen. Cholecystectomy. No biliary dilatation. Pancreas: No peripancreatic inflammation. Punctate calcifications in the pancreatic body and tail as before. No ductal dilatation. Spleen: Mildly enlarged, 13.4 cm greatest dimension. Adrenals/Urinary Tract: Stable 3.9 x 3.2 cm  heterogeneous right adrenal nodule. This contains macroscopic fat consistent with benign myelolipoma. No further follow-up imaging is recommended. Slight left adrenal thickening without nodule, unchanged. Bilateral renal parenchymal atrophy. Bilateral renal cysts. No further follow-up imaging is recommended. Urinary bladder is near completely empty. Stomach/Bowel: Bowel assessment is limited in the absence of contrast and presence of ascites. No abnormal gastric distension. Clips in the duodenum as before. Mild gaseous distension of small bowel in the central abdomen in a nonobstructive pattern. There is also mild gaseous distension of ascending and transverse colon. Sigmoid diverticulosis without diverticulitis. No obvious bowel inflammation is seen. Vascular/Lymphatic: Aortic and branch atherosclerosis. No aortic aneurysm. No gross lymphadenopathy. Reproductive: Prominent prostate with small clips. Other: Moderate volume abdominopelvic ascites. Some of this fluid appears minimally complex, however this is felt to be related to artifact due to overlying monitoring devices and arms down positioning. There is no hematocrit level to suggest hemoperitoneum. No free air. Generalized body wall edema. Musculoskeletal: There is chronic endplate irregularity at L3-L4 as well as chronic Schmorl's node within superior endplate of L2. No acute osseous findings. IMPRESSION: 1. Patchy and nodular airspace disease in the lingula and left greater than right lower lobes, suspicious for pneumonia. 2. Cirrhosis with moderate volume abdominopelvic ascites. Mild splenomegaly. 3. Mild gaseous distension of small bowel and colon in a nonobstructive pattern, may represent ileus. 4. Sigmoid diverticulosis without diverticulitis. 5. Dilated main pulmonary artery, can be seen with pulmonary arterial hypertension. Aortic Atherosclerosis (ICD10-I70.0). Electronically Signed   By: Narda Rutherford M.D.   On: 05/04/2023 18:00   DG Chest Port 1  View Result Date: 05/04/2023 CLINICAL DATA:  Questionable sepsis - evaluate for abnormality Patient's wife states he woke up screaming and coughing, also c/o abd pain. Patient is lethargic and weak. Hx diabetes EXAM: PORTABLE CHEST 1 VIEW COMPARISON:  Chest x-ray 07/24/2022 FINDINGS: The heart and mediastinal contours are within normal limits. Surgical hardware of right the mediastinum. Atherosclerotic plaque Low lung volumes. No focal consolidation. Chronic coarsened interstitial markings with no overt pulmonary edema. No pleural effusion. No pneumothorax. No acute osseous abnormality.  Intact sternotomy wires. IMPRESSION: 1. Low lung volumes with no  active disease. 2.  Aortic Atherosclerosis (ICD10-I70.0). Electronically Signed   By: Tish Frederickson M.D.   On: 05/04/2023 17:09    Procedures Procedures    CRITICAL CARE Performed by: Richardean Canal   Total critical care time: 38 minutes  Critical care time was exclusive of separately billable procedures and treating other patients.  Critical care was necessary to treat or prevent imminent or life-threatening deterioration.  Critical care was time spent personally by me on the following activities: development of treatment plan with patient and/or surrogate as well as nursing, discussions with consultants, evaluation of patient's response to treatment, examination of patient, obtaining history from patient or surrogate, ordering and performing treatments and interventions, ordering and review of laboratory studies, ordering and review of radiographic studies, pulse oximetry and re-evaluation of patient's condition.   Medications Ordered in ED Medications  lactated ringers infusion ( Intravenous New Bag/Given 05/04/23 1955)  acetaminophen (TYLENOL) tablet 650 mg (650 mg Oral Given 05/04/23 2216)  sodium chloride 0.9 % bolus 1,000 mL (0 mLs Intravenous Stopped 05/04/23 1855)  cefTRIAXone (ROCEPHIN) 2 g in sodium chloride 0.9 % 100 mL IVPB (0 g  Intravenous Stopped 05/04/23 1813)  azithromycin (ZITHROMAX) 500 mg in sodium chloride 0.9 % 250 mL IVPB (0 mg Intravenous Stopped 05/04/23 1855)  acetaminophen (TYLENOL) tablet 650 mg (650 mg Oral Given 05/04/23 1716)    ED Course/ Medical Decision Making/ A&P                                 Medical Decision Making [name] is a 73 y.o. male here with fever and confusion and hypoxia.  Concern for COVID or flu or RSV or pneumonia.  Will do sepsis workup with CBC and CMP and lactate and cultures and chest x-ray.  Will give empiric antibiotics for pneumonia  6:36 PM Patient's white blood cell count is normal and lactate is normal.  However patient continues to be altered.  Creatinine is baseline at 5.8.  Initial chest x-ray is clear and COVID and flu RSV negative.  CT chest abdomen pelvis showed pneumonia that is bilateral.  Patient also has ileus and known cirrhosis.  Patient received Rocephin and azithromycin.  At this point patient will be admitted for pneumonia with hypoxia    Problems Addressed: Community acquired pneumonia, unspecified laterality: acute illness or injury ESRD (end stage renal disease) on dialysis [facility]): acute illness or injury Hypoxia: acute illness or injury  Amount and/or Complexity of Data Reviewed Labs: ordered. Decision-making details documented in ED Course. Radiology: ordered and independent interpretation performed. Decision-making details documented in ED Course.  Risk OTC drugs. Prescription drug management. Decision regarding hospitalization.    Final Clinical Impression(s) / ED Diagnoses Final diagnoses:  Community acquired pneumonia, unspecified laterality  Hypoxia  ESRD (end stage renal disease) on dialysis [facility])    Rx / DC Orders ED Discharge Orders     None         Charlynne Pander, MD 05/04/23 2325

## 2023-05-04 NOTE — ED Notes (Signed)
Report given to the next RN.Marland KitchenMarland Kitchen

## 2023-05-04 NOTE — ED Triage Notes (Signed)
Wife woke up to patient was screaming/coughing the patient is due to have his stomach tapped on Tuesday. He states his stomach hurts.

## 2023-05-04 NOTE — ED Notes (Signed)
Pt aware of the need for a urine... Unable to currently provide a urine sample.Marland KitchenMarland Kitchen

## 2023-05-05 ENCOUNTER — Inpatient Hospital Stay (HOSPITAL_COMMUNITY): Payer: Medicare Other

## 2023-05-05 DIAGNOSIS — Z89512 Acquired absence of left leg below knee: Secondary | ICD-10-CM | POA: Diagnosis not present

## 2023-05-05 DIAGNOSIS — Z992 Dependence on renal dialysis: Secondary | ICD-10-CM | POA: Diagnosis not present

## 2023-05-05 DIAGNOSIS — R188 Other ascites: Secondary | ICD-10-CM | POA: Diagnosis present

## 2023-05-05 DIAGNOSIS — I120 Hypertensive chronic kidney disease with stage 5 chronic kidney disease or end stage renal disease: Secondary | ICD-10-CM | POA: Diagnosis present

## 2023-05-05 DIAGNOSIS — N186 End stage renal disease: Secondary | ICD-10-CM | POA: Diagnosis present

## 2023-05-05 DIAGNOSIS — N2581 Secondary hyperparathyroidism of renal origin: Secondary | ICD-10-CM | POA: Diagnosis present

## 2023-05-05 DIAGNOSIS — E039 Hypothyroidism, unspecified: Secondary | ICD-10-CM | POA: Diagnosis present

## 2023-05-05 DIAGNOSIS — D638 Anemia in other chronic diseases classified elsewhere: Secondary | ICD-10-CM

## 2023-05-05 DIAGNOSIS — D631 Anemia in chronic kidney disease: Secondary | ICD-10-CM | POA: Diagnosis present

## 2023-05-05 DIAGNOSIS — D696 Thrombocytopenia, unspecified: Secondary | ICD-10-CM | POA: Diagnosis present

## 2023-05-05 DIAGNOSIS — E871 Hypo-osmolality and hyponatremia: Secondary | ICD-10-CM | POA: Diagnosis present

## 2023-05-05 DIAGNOSIS — J9601 Acute respiratory failure with hypoxia: Secondary | ICD-10-CM | POA: Diagnosis present

## 2023-05-05 DIAGNOSIS — G959 Disease of spinal cord, unspecified: Secondary | ICD-10-CM | POA: Diagnosis present

## 2023-05-05 DIAGNOSIS — E785 Hyperlipidemia, unspecified: Secondary | ICD-10-CM

## 2023-05-05 DIAGNOSIS — K7460 Unspecified cirrhosis of liver: Secondary | ICD-10-CM | POA: Diagnosis present

## 2023-05-05 DIAGNOSIS — I2510 Atherosclerotic heart disease of native coronary artery without angina pectoris: Secondary | ICD-10-CM

## 2023-05-05 DIAGNOSIS — J189 Pneumonia, unspecified organism: Secondary | ICD-10-CM | POA: Diagnosis present

## 2023-05-05 DIAGNOSIS — E1141 Type 2 diabetes mellitus with diabetic mononeuropathy: Secondary | ICD-10-CM | POA: Diagnosis present

## 2023-05-05 DIAGNOSIS — E1169 Type 2 diabetes mellitus with other specified complication: Secondary | ICD-10-CM

## 2023-05-05 DIAGNOSIS — I10 Essential (primary) hypertension: Secondary | ICD-10-CM

## 2023-05-05 DIAGNOSIS — I4892 Unspecified atrial flutter: Secondary | ICD-10-CM | POA: Diagnosis present

## 2023-05-05 DIAGNOSIS — A419 Sepsis, unspecified organism: Secondary | ICD-10-CM | POA: Diagnosis present

## 2023-05-05 DIAGNOSIS — Z89511 Acquired absence of right leg below knee: Secondary | ICD-10-CM | POA: Diagnosis not present

## 2023-05-05 DIAGNOSIS — I4821 Permanent atrial fibrillation: Secondary | ICD-10-CM | POA: Diagnosis present

## 2023-05-05 DIAGNOSIS — G629 Polyneuropathy, unspecified: Secondary | ICD-10-CM

## 2023-05-05 DIAGNOSIS — R0902 Hypoxemia: Secondary | ICD-10-CM | POA: Diagnosis present

## 2023-05-05 DIAGNOSIS — E1151 Type 2 diabetes mellitus with diabetic peripheral angiopathy without gangrene: Secondary | ICD-10-CM | POA: Diagnosis present

## 2023-05-05 DIAGNOSIS — Z951 Presence of aortocoronary bypass graft: Secondary | ICD-10-CM

## 2023-05-05 DIAGNOSIS — E1122 Type 2 diabetes mellitus with diabetic chronic kidney disease: Secondary | ICD-10-CM | POA: Diagnosis present

## 2023-05-05 DIAGNOSIS — K567 Ileus, unspecified: Secondary | ICD-10-CM | POA: Diagnosis present

## 2023-05-05 HISTORY — PX: IR PARACENTESIS: IMG2679

## 2023-05-05 LAB — BODY FLUID CELL COUNT WITH DIFFERENTIAL
Eos, Fluid: 0 %
Lymphs, Fluid: 28 %
Monocyte-Macrophage-Serous Fluid: 56 % (ref 50–90)
Neutrophil Count, Fluid: 16 % (ref 0–25)
Total Nucleated Cell Count, Fluid: 275 uL (ref 0–1000)

## 2023-05-05 LAB — GLUCOSE, CAPILLARY: Glucose-Capillary: 174 mg/dL — ABNORMAL HIGH (ref 70–99)

## 2023-05-05 LAB — BRAIN NATRIURETIC PEPTIDE: B Natriuretic Peptide: 1579.9 pg/mL — ABNORMAL HIGH (ref 0.0–100.0)

## 2023-05-05 LAB — MAGNESIUM: Magnesium: 1.7 mg/dL (ref 1.7–2.4)

## 2023-05-05 LAB — PROTEIN, PLEURAL OR PERITONEAL FLUID: Total protein, fluid: 3.5 g/dL

## 2023-05-05 LAB — ALBUMIN, PLEURAL OR PERITONEAL FLUID: Albumin, Fluid: 2.1 g/dL

## 2023-05-05 LAB — PROCALCITONIN: Procalcitonin: 4.73 ng/mL

## 2023-05-05 LAB — TSH: TSH: 1.546 u[IU]/mL (ref 0.350–4.500)

## 2023-05-05 LAB — HEPATITIS B SURFACE ANTIGEN: Hepatitis B Surface Ag: NONREACTIVE

## 2023-05-05 MED ORDER — LIDOCAINE HCL (PF) 1 % IJ SOLN: 5.0000 mL | INTRAMUSCULAR | Status: DC | PRN

## 2023-05-05 MED ORDER — HEPARIN SODIUM (PORCINE) 1000 UNIT/ML DIALYSIS: 1000.0000 [IU] | INTRAMUSCULAR | Status: DC | PRN

## 2023-05-05 MED ORDER — SEVELAMER CARBONATE 800 MG PO TABS
800.0000 mg | ORAL_TABLET | Freq: Three times a day (TID) | ORAL | Status: DC
  Administered 2023-05-05 – 2023-05-06 (×3): 800 mg via ORAL
  Filled 2023-05-05 (×3): qty 1

## 2023-05-05 MED ORDER — ALBUTEROL SULFATE (2.5 MG/3ML) 0.083% IN NEBU: 2.5000 mg | INHALATION_SOLUTION | RESPIRATORY_TRACT | Status: DC | PRN

## 2023-05-05 MED ORDER — SODIUM CHLORIDE 0.9% FLUSH
3.0000 mL | Freq: Two times a day (BID) | INTRAVENOUS | Status: DC
Start: 2023-05-05 — End: 2023-05-06
  Administered 2023-05-05 – 2023-05-06 (×3): 3 mL via INTRAVENOUS

## 2023-05-05 MED ORDER — GUAIFENESIN ER 600 MG PO TB12
600.0000 mg | ORAL_TABLET | Freq: Two times a day (BID) | ORAL | Status: DC
  Administered 2023-05-05 – 2023-05-06 (×3): 600 mg via ORAL
  Filled 2023-05-05 (×3): qty 1

## 2023-05-05 MED ORDER — MODAFINIL 100 MG PO TABS
200.0000 mg | ORAL_TABLET | Freq: Every day | ORAL | Status: DC
  Administered 2023-05-05 – 2023-05-06 (×2): 200 mg via ORAL
  Filled 2023-05-05 (×2): qty 2

## 2023-05-05 MED ORDER — PROPRANOLOL HCL 40 MG PO TABS
40.0000 mg | ORAL_TABLET | Freq: Every day | ORAL | Status: DC
  Filled 2023-05-05 (×2): qty 1

## 2023-05-05 MED ORDER — LIDOCAINE-PRILOCAINE 2.5-2.5 % EX CREA: 1.0000 | TOPICAL_CREAM | CUTANEOUS | Status: DC | PRN

## 2023-05-05 MED ORDER — LEVOTHYROXINE SODIUM 112 MCG PO TABS: 112.0000 ug | ORAL_TABLET | Freq: Every day | ORAL | Status: DC

## 2023-05-05 MED ORDER — TORSEMIDE 20 MG PO TABS
20.0000 mg | ORAL_TABLET | Freq: Two times a day (BID) | ORAL | Status: DC
  Administered 2023-05-05 – 2023-05-06 (×2): 20 mg via ORAL
  Filled 2023-05-05 (×2): qty 1

## 2023-05-05 MED ORDER — HEPARIN SODIUM (PORCINE) 5000 UNIT/ML IJ SOLN
5000.0000 [IU] | Freq: Three times a day (TID) | INTRAMUSCULAR | Status: DC
  Administered 2023-05-05: 5000 [IU] via SUBCUTANEOUS
  Filled 2023-05-05 (×2): qty 1

## 2023-05-05 MED ORDER — ALTEPLASE 2 MG IJ SOLR: 2.0000 mg | Freq: Once | INTRAMUSCULAR | Status: DC | PRN

## 2023-05-05 MED ORDER — LIDOCAINE HCL 1 % IJ SOLN
20.0000 mL | Freq: Once | INTRAMUSCULAR | Status: AC
  Administered 2023-05-05: 10 mL
  Filled 2023-05-05: qty 20

## 2023-05-05 MED ORDER — CALCITRIOL 0.5 MCG PO CAPS: 0.5000 ug | ORAL_CAPSULE | ORAL | Status: DC

## 2023-05-05 MED ORDER — LIDOCAINE HCL 1 % IJ SOLN
INTRAMUSCULAR | Status: AC
Start: 2023-05-05 — End: ?
  Filled 2023-05-05: qty 20

## 2023-05-05 MED ORDER — SODIUM CHLORIDE 0.9 % IV SOLN
500.0000 mg | INTRAVENOUS | Status: DC
  Administered 2023-05-05: 500 mg via INTRAVENOUS
  Filled 2023-05-05: qty 5

## 2023-05-05 MED ORDER — SODIUM CHLORIDE 0.9 % IV SOLN: 500.0000 mg | INTRAVENOUS | Status: DC

## 2023-05-05 MED ORDER — PREGABALIN 75 MG PO CAPS
150.0000 mg | ORAL_CAPSULE | Freq: Three times a day (TID) | ORAL | Status: DC
  Administered 2023-05-05 – 2023-05-06 (×3): 150 mg via ORAL
  Filled 2023-05-05 (×3): qty 2

## 2023-05-05 MED ORDER — CHLORHEXIDINE GLUCONATE CLOTH 2 % EX PADS
6.0000 | MEDICATED_PAD | Freq: Every day | CUTANEOUS | Status: DC
  Administered 2023-05-06: 6 via TOPICAL

## 2023-05-05 MED ORDER — ACETAMINOPHEN 650 MG RE SUPP
650.0000 mg | Freq: Four times a day (QID) | RECTAL | Status: DC | PRN
Start: 2023-05-05 — End: 2023-05-06

## 2023-05-05 MED ORDER — KETOCONAZOLE 2 % EX CREA
1.0000 | TOPICAL_CREAM | Freq: Two times a day (BID) | CUTANEOUS | Status: DC | PRN
  Filled 2023-05-05: qty 15

## 2023-05-05 MED ORDER — ALLOPURINOL 100 MG PO TABS
200.0000 mg | ORAL_TABLET | Freq: Every day | ORAL | Status: DC
  Administered 2023-05-05: 200 mg via ORAL
  Filled 2023-05-05 (×2): qty 2

## 2023-05-05 MED ORDER — HYDROCODONE BIT-HOMATROP MBR 5-1.5 MG/5ML PO SOLN
5.0000 mL | Freq: Four times a day (QID) | ORAL | Status: DC | PRN
  Administered 2023-05-05: 5 mL via ORAL
  Filled 2023-05-05: qty 5

## 2023-05-05 MED ORDER — SODIUM CHLORIDE 0.9 % IV SOLN
2.0000 g | INTRAVENOUS | Status: DC
  Administered 2023-05-05: 2 g via INTRAVENOUS
  Filled 2023-05-05: qty 20

## 2023-05-05 MED ORDER — ROSUVASTATIN CALCIUM 5 MG PO TABS
10.0000 mg | ORAL_TABLET | Freq: Every day | ORAL | Status: DC
  Administered 2023-05-05 – 2023-05-06 (×2): 10 mg via ORAL
  Filled 2023-05-05 (×2): qty 2

## 2023-05-05 MED ORDER — PENTAFLUOROPROP-TETRAFLUOROETH EX AERO: 1.0000 | INHALATION_SPRAY | CUTANEOUS | Status: DC | PRN

## 2023-05-05 MED ORDER — ACETAMINOPHEN 325 MG PO TABS: 650.0000 mg | ORAL_TABLET | Freq: Four times a day (QID) | ORAL | Status: DC | PRN

## 2023-05-05 NOTE — ED Notes (Signed)
Patient updated on room and provided with breakfast.

## 2023-05-05 NOTE — ED Notes (Signed)
Rt went to assess the Pt's oxygen. The pulse ox wasn't picking up with 2 different pulse ox. RT placed the band aid pulse ox on the Pt ear and its reading 94-96% om RA. The Pt stated he didn't like the [place] and RT explained to him that if his O2 dropped that we would have to put it back on. The Pt was OK with that.

## 2023-05-05 NOTE — Consult Note (Signed)
ESRD Consult Note  Requesting provider: Madelyn Flavors Service requesting consult: Hospitalist Reason for consult: ESRD, provision of dialysis Indication for acute dialysis?: End Stage Renal Disease  Outpatient dialysis unit: Home, [place] Outpatient dialysis prescription: Home HD 3 days weekly  Assessment/Recommendations: [name] is a/an 73 y.o. male with a past medical history notable for ESRD on HD admitted with pneumonia  # ESRD: Last dialysis on Friday.  Labs reassuring.  Volume overload largely related to ascites.  Plan on dialysis tomorrow afternoon.  Likely maintain TTS schedule.  The patient would not need to remain in the hospital for dialysis as he can do dialysis at home.  # Volume/ hypertension: EDW 88kg. Attempt to achieve EDW as tolerated. Can cont home BP meds.  # Anemia of Chronic Kidney Disease: Hemoglobin 10.6. Last received Mircera on 12/15. On IV iron. Hold for now.  # Secondary Hyperparathyroidism/Hyperphosphatemia: Ca at goal. Check phos. Cont home calcitriol 0.21mcg 3x weekly and sevelamer with meals  # Vascular access: AVF with no issues  # Pneumonia: on CT scan. Abx per primary  # Cirrhosis/Ascites: plan for LVP while here   # Additional recommendations: - Dose all meds for creatinine clearance < 10 ml/min  - Unless absolutely necessary, no MRIs with gadolinium.  - Implement save arm precautions.  Prefer needle sticks in the dorsum of the hands or wrists.  No blood pressure measurements in arm. - If blood transfusion is requested during hemodialysis sessions, please alert [place] prior to the session.  - Use synthetic opioids (Fentanyl/Dilaudid) if needed  Recommendations were discussed with the primary team.  CC: Cough  History of Present Illness: [name] is a/an 72 y.o. male with a past medical history of HTN, PAD, ESRD who presents with cough.  Patient states he was feeling fine a few days ago but started developing a cough.  He also had  intermittent severe cough that led to him vomiting.  Has not felt particularly nauseated.  Maybe had a little bit of shortness of breath.  Nobody else around him has had URI symptoms.  He was not having fevers until today was noted to have a temperature of 102.  Does not have much abdominal pain but has noted worsening abdominal distention and feeling of fullness.  He has cirrhosis and has required paracentesis periodically.  Was planning to get 1 done tomorrow.  Home dialysis is gone well without any complications.  He has not had chest pain.  No bloody emesis or bloody stools.  In the emergency department was noted to have a fever.  He was given some fluids as well as antibiotics.  CT chest abdomen and pelvis demonstrated signs suspicious for pneumonia as well as moderate ascites and possible ileus.   Medications:  Current Facility-Administered Medications  Medication Dose Route Frequency Provider Last Rate Last Admin   acetaminophen (TYLENOL) tablet 650 mg  650 mg Oral Q6H PRN [name], MD       Or   acetaminophen (TYLENOL) suppository 650 mg  650 mg Rectal Q6H PRN [name], MD       albuterol (PROVENTIL) (2.5 MG/3ML) 0.083% nebulizer solution 2.5 mg  2.5 mg Nebulization Q2H PRN [name], [name], MD       azithromycin (ZITHROMAX) 500 mg in sodium chloride 0.9 % 250 mL IVPB  500 mg Intravenous Q24H [name], [name], MD       cefTRIAXone (ROCEPHIN) 2 g in sodium chloride 0.9 % 100 mL IVPB  2  g Intravenous Q24H [name], MD       guaiFENesin (MUCINEX) 12 hr tablet 600 mg  600 mg Oral BID [name], [name], MD       heparin injection 5,000 Units  5,000 Units Subcutaneous Q8H [name], [name], MD       pregabalin (LYRICA) capsule 150 mg  150 mg Oral TID [name], MD       sodium chloride flush (NS) 0.9 % injection 3 mL  3 mL Intravenous Q12H [name], [name], MD         ALLERGIES Morphine and Mushroom extract complex (do not select)  MEDICAL HISTORY Past Medical  History:  Diagnosis Date   Acute osteomyelitis of metatarsal bone of left foot (HCC)    Ambulates with cane    straight cane   Anemia    Cervical myelopathy (HCC) 02/06/2018   Chronic kidney disease    dailysis M W F- home   Community acquired pneumonia of left lower lobe of lung 08/18/2021   Complication of anesthesia    Coronary artery disease    Dehiscence of amputation stump of left lower extremity (HCC)    Diabetes mellitus without complication (HCC)    type2   Diabetic foot ulcer (HCC)    Diabetic neuropathy (HCC) 02/06/2018   Dysrhythmia    Gait abnormality 08/24/2018   GERD (gastroesophageal reflux disease)     01/06/20- not current   Gout    History of blood transfusion    History of blood transfusion    History of kidney stones    passed stones   Hypercholesteremia    Hypertension    Hypothyroidism    Neuromuscular disorder (HCC)    neuropathy left leg and bilateral feet   Neuropathy    Partial nontraumatic amputation of foot, 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   PONV (postoperative nausea and vomiting)    Prostate cancer (HCC)    PVD (peripheral vascular disease) (HCC)    with amputations     SOCIAL HISTORY Social History   Socioeconomic History   Marital status: Married    Spouse name: Not on file   Number of children: 2   Years of education: Not on file   Highest education level: Not on file  Occupational History   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  Tobacco Use   Smoking status: Former    Types: Cigars    Start date: 04/08/1973    Quit date: 12/24/1988    Years since quitting: 34.3   Smokeless tobacco: Never   Tobacco comments:    Cigars and Pipe   Vaping Use   Vaping status: Never Used  Substance and Sexual Activity   Alcohol use: Never   Drug use: Never   Sexual activity: Yes    Partners: Female  Other Topics Concern   Not on file  Social History Narrative   Regular exercise: yes 3 times a week   Caffeine use: hot tea   Social Drivers of  Corporate investment banker Strain: Not on file  Food Insecurity: Patient Declined (07/28/2022)   Hunger Vital Sign    Worried About Running Out of Food in the Last Year: Patient declined    Ran Out of Food in the Last Year: Patient declined  Transportation Needs: No Transportation Needs (07/28/2022)   PRAPARE - Administrator, Civil Service (Medical): No    Lack of Transportation (Non-Medical): No  Physical Activity: Not on file  Stress: Not on file  Social Connections: Unknown (08/21/2021)   Received from [facility]   Social Network    Social Network: Not on file  Intimate Partner Violence: Not At Risk (07/28/2022)   Humiliation, Afraid, Rape, and Kick questionnaire    Fear of Current or Ex-Partner: No    Emotionally Abused: No    Physically Abused: No    Sexually Abused: No     FAMILY HISTORY Family History  Problem Relation Age of Onset   Diabetes Mellitus II Mother    Kidney disease Mother    Diabetes Mellitus II Father    CAD Father    Cancer Father        prostate   Kidney disease Father    Diabetes Mellitus II Brother    Kidney disease Brother    Diabetes Mellitus II Brother    Stomach cancer Brother 17   Kidney disease Brother    Colon cancer Neg Hx    Colon polyps Neg Hx    Esophageal cancer Neg Hx    Rectal cancer Neg Hx    Pancreatic cancer Neg Hx       Review of Systems: 12 systems reviewed Otherwise as per HPI, all other systems reviewed and negative  Physical Exam: Vitals:   05/05/23 0915 05/05/23 1116  BP:  (!) 130/98  Pulse: 63 68  Resp: 20 16  Temp:  97.7 F (36.5 C)  SpO2: 98% 98%   No intake/output data recorded.  Intake/Output Summary (Last 24 hours) at 05/05/2023 1405 Last data filed at 05/04/2023 1855 Gross per 24 hour  Intake 1350 ml  Output --  Net 1350 ml   General: well-appearing, no acute distress HEENT: anicteric sclera, oropharynx clear without lesions CV: Normal rate, no rub, no edema in  stump or upper extremity Lungs: clear to auscultation bilaterally, normal work of breathing Abd: soft, non-tender, mild distention Skin: no visible lesions or rashes Psych: alert, engaged, appropriate mood and affect Musculoskeletal: Both legs absent below the knee, otherwise no obvious deformities Neuro: normal speech, no gross focal deficits   Test Results Reviewed Lab Results  Component Value Date   NA 131 (L) 05/04/2023   K 5.0 05/04/2023   CL 89 (L) 05/04/2023   CO2 27 05/04/2023   BUN 49 (H) 05/04/2023   CREATININE 5.81 (H) 05/04/2023   GFR 7.03 (LL) 10/11/2022   CALCIUM 9.3 05/04/2023   ALBUMIN 4.5 05/04/2023   PHOS 4.0 08/11/2022    CBC Recent Labs  Lab 05/04/23 1620  WBC 9.4  NEUTROABS 8.3*  HGB 10.6*  HCT 34.1*  MCV 88.1  PLT 119*    I have reviewed all relevant outside healthcare records related to the patient's current hospitalization

## 2023-05-05 NOTE — Progress Notes (Signed)
[facility] night cross cover note:   I was notified by RN that the patient is complaining of cough, and conveys that cough medicine that includes codeine has been most effective for him as an antitussive.  He is also refusing his evening heparin injection for DVT prophylaxis.  I subsequently ordered prn Hycodan for cough.   Update: Hycodan was initially helpful in addressing the patient's cough.  However, his cough has recently started to intensify, and he is not yet due for his next dose of prn Hycodan.   I subsequently changed the frequency for his prn Hycodan from every 6 hours as needed to every 4 hours as needed to help address the above.    Newton Pigg, DO Hospitalist

## 2023-05-05 NOTE — ED Notes (Signed)
Called [place] at [facility] for transport

## 2023-05-05 NOTE — Procedures (Signed)
PROCEDURE SUMMARY:  Successful image-guided paracentesis from the right lateral abdomen.  Yielded 3.3 liters of clear yellow fluid.  No immediate complications.  EBL: zero Patient tolerated well.   Specimen was sent for labs.  Please see imaging section of Epic for full dictation.  Villa Herb PA-C 05/05/2023 3:28 PM

## 2023-05-05 NOTE — H&P (Signed)
History and Physical    Patient: [name] UEA:[id number] DOB: 1950/10/04 DOA: 05/04/2023 DOS: the patient was seen and examined on 05/05/2023 PCP: Adolph Pollack, FNP  Patient coming from: [facility] from [facility]   Chief Complaint:  Chief Complaint  Patient presents with   Altered Mental Status   HPI: [name] is a 73 y.o. male with medical history significant of CAD s/p CABG, DM type II, ESRD on HD,osteomyelitis s/p bilateral BKA, history of GI bleed and GERD presents with complaints of severe cough. The cough has been been present for approximately two to three days and is productive. The patient initially denied any other symptoms besides the cough, but  also reported feeling fatigued.  He makes note that he was scheduled to have a paracentesis tomorrow, and that his abdomen has been very distended.  Normally dialyzes on Monday, Wednesday, and Saturday afternoons and is due for dialysis today.  The patient also reported discomfort in his amputation stumps, which is usually managed with Pregabalin (Lyrica) 150mg  three times daily. However, he had not taken this medication in almost 2 days now.  In the emergency department patient was noted to be febrile up to 101.4 F with respirations elevated up to 24, O2 saturations noted to be as low as 86% with improvement on 2 L nasal cannula oxygen.  Labs significant for hemoglobin 10.6, platelets 119, sodium 131, chloride 89, BUN 49, creatinine 5.81, lactic acid 1.8-> 1.1.  Chest x-ray noted low lung volumes with no active disease.  Influenza, COVID-19, and RSV screening were negative blood cultures have been obtained.  Patient had been given acetaminophen 650 mg, 1 L lactated Ringer's, Rocephin, and azithromycin.  Review of Systems: As mentioned in the history of present illness. All other systems reviewed and are negative. Past Medical History:  Diagnosis Date   Acute osteomyelitis of metatarsal bone of left foot (HCC)    Ambulates with  cane    straight cane   Anemia    Cervical myelopathy (HCC) 02/06/2018   Chronic kidney disease    dailysis M W F- home   Community acquired pneumonia of left lower lobe of lung 08/18/2021   Complication of anesthesia    Coronary artery disease    Dehiscence of amputation stump of left lower extremity (HCC)    Diabetes mellitus without complication (HCC)    type2   Diabetic foot ulcer (HCC)    Diabetic neuropathy (HCC) 02/06/2018   Dysrhythmia    Gait abnormality 08/24/2018   GERD (gastroesophageal reflux disease)     01/06/20- not current   Gout    History of blood transfusion    History of blood transfusion    History of kidney stones    passed stones   Hypercholesteremia    Hypertension    Hypothyroidism    Neuromuscular disorder (HCC)    neuropathy left leg and bilateral feet   Neuropathy    Partial nontraumatic amputation of foot, 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   PONV (postoperative nausea and vomiting)    Prostate cancer (HCC)    PVD (peripheral vascular disease) (HCC)    with amputations   Past Surgical History:  Procedure Laterality Date   A-FLUTTER ABLATION N/A 04/06/2020   Procedure: A-FLUTTER ABLATION;  Surgeon: Marinus Maw, MD;  Location: [facility] INVASIVE CV LAB;  Service: Cardiovascular;  Laterality: N/A;   A/V FISTULAGRAM N/A 03/05/2023   Procedure: A/V Fistulagram;  Surgeon: Daria Pastures, MD;  Location: [facility] INVASIVE CV LAB;  Service:  Vascular;  Laterality: N/A;   AMPUTATION Left 12/25/2013   Procedure: AMPUTATION RAY LEFT 5TH RAY;  Surgeon: Nadara Mustard, MD;  Location: [facility] ORS;  Service: Orthopedics;  Laterality: Left;   AMPUTATION Right 12/15/2018   Procedure: AMPUTATION OF 4TH AND 5TH TOES RIGHT FOOT;  Surgeon: Nadara Mustard, MD;  Location: [facility] OR;  Service: Orthopedics;  Laterality: Right;   AMPUTATION Left 02/02/2021   Procedure: LEFT FOOT 4TH RAY AMPUTATION;  Surgeon: Nadara Mustard, MD;  Location: [facility] OR;  Service: Orthopedics;  Laterality: Left;   AMPUTATION  Left 05/09/2021   Procedure: LEFT BELOW KNEE AMPUTATION;  Surgeon: Nadara Mustard, MD;  Location: [facility] OR;  Service: Orthopedics;  Laterality: Left;   AMPUTATION Right 07/26/2022   Procedure: RIGHT BELOW KNEE AMPUTATION;  Surgeon: Nadara Mustard, MD;  Location: [name] OR;  Service: Orthopedics;  Laterality: Right;   APPLICATION OF WOUND VAC Right 12/15/2018   Procedure: APPLICATION OF WOUND VAC;  Surgeon: Nadara Mustard, MD;  Location: [facility] OR;  Service: Orthopedics;  Laterality: Right;   AV FISTULA PLACEMENT Left 03/25/2019   Procedure: LEFT ARM ARTERIOVENOUS (AV) FISTULA CREATION;  Surgeon: Nada Libman, MD;  Location: [facility] OR;  Service: Vascular;  Laterality: Left;   BACK SURGERY     BASCILIC VEIN TRANSPOSITION Left 05/20/2019   Procedure: SECOND STAGE LEFT BASCILIC VEIN TRANSPOSITION;  Surgeon: Nada Libman, MD;  Location: [facility] OR;  Service: Vascular;  Laterality: Left;   BIOPSY  06/22/2022   Procedure: BIOPSY;  Surgeon: Lemar Lofty., MD;  Location: [facility] ENDOSCOPY;  Service: Gastroenterology;;   CARDIAC CATHETERIZATION  02/17/2014   CHOLECYSTECTOMY     COLONOSCOPY  2011   in [place], Normal   COLONOSCOPY N/A 06/23/2022   Procedure: COLONOSCOPY;  Surgeon: Sherrilyn Rist, MD;  Location: [facility] ENDOSCOPY;  Service: Gastroenterology;  Laterality: N/A;   CORONARY ARTERY BYPASS GRAFT  2008   CYSTOSCOPY N/A 06/29/2020   Procedure: CYSTOSCOPY WITH CLOT EVACUATION AND  FULGERATION;  Surgeon: Marcine Matar, MD;  Location: [facility] OR;  Service: Urology;  Laterality: N/A;   CYSTOSCOPY WITH FULGERATION Bilateral 06/17/2020   Procedure: CYSTOSCOPY,BILATERAL RETROGRADE, CLOT EVACUATION WITH FULGERATION OF THE BLADDER;  Surgeon: Jannifer Hick, MD;  Location: [facility] OR;  Service: Urology;  Laterality: Bilateral;   CYSTOSCOPY WITH FULGERATION N/A 06/28/2020   Procedure: CYSTOSCOPY WITH CLOT EVACUATION AND FULGERATION OF BLEEDERS;  Surgeon: Marcine Matar, MD;  Location: [facility] OR;  Service: Urology;   Laterality: N/A;  1 HR   ENTEROSCOPY N/A 06/22/2022   Procedure: ENTEROSCOPY;  Surgeon: Meridee Score Netty Starring., MD;  Location: [facility] ENDOSCOPY;  Service: Gastroenterology;  Laterality: N/A;   HEMOSTASIS CLIP PLACEMENT  06/22/2022   Procedure: HEMOSTASIS CLIP PLACEMENT;  Surgeon: Lemar Lofty., MD;  Location: [facility] ENDOSCOPY;  Service: Gastroenterology;;   HOT HEMOSTASIS N/A 06/22/2022   Procedure: HOT HEMOSTASIS (ARGON PLASMA COAGULATION/BICAP);  Surgeon: Lemar Lofty., MD;  Location: [facility] ENDOSCOPY;  Service: Gastroenterology;  Laterality: N/A;   I & D EXTREMITY Right 12/15/2018   Procedure: DEBRIDEMENT RIGHT FOOT;  Surgeon: Nadara Mustard, MD;  Location: [facility] OR;  Service: Orthopedics;  Laterality: Right;   I & D EXTREMITY Right 08/27/2019   Procedure: PARTIAL CUBOID EXCISION RIGHT FOOT;  Surgeon: Nadara Mustard, MD;  Location: [facility] OR;  Service: Orthopedics;  Laterality: Right;   I & D EXTREMITY Right 01/07/2020   Procedure: RIGHT FOOT EXCISION INFECTED BONE;  Surgeon: Nadara Mustard, MD;  Location: [facility]  OR;  Service: Orthopedics;  Laterality: Right;   I & D EXTREMITY Right 05/17/2022   Procedure: IRRIGATION AND DEBRIDEMENT RIGHT FOOT ABSCESS;  Surgeon: Nadara Mustard, MD;  Location: [facility] OR;  Service: Orthopedics;  Laterality: Right;   IR PARACENTESIS  10/11/2022   IR PARACENTESIS  12/24/2022   IR PARACENTESIS  02/06/2023   NECK SURGERY     novemver 2019   PERIPHERAL VASCULAR BALLOON ANGIOPLASTY Left 03/05/2023   Procedure: PERIPHERAL VASCULAR BALLOON ANGIOPLASTY;  Surgeon: Daria Pastures, MD;  [facility]: [facility] INVASIVE CV LAB;  Service: Vascular;  Laterality: Left;  AVF   POLYPECTOMY  06/22/2022   Procedure: POLYPECTOMY;  Surgeon: Mansouraty, Netty Starring., MD;  Location: [facility] ENDOSCOPY;  Service: Gastroenterology;;   POLYPECTOMY  06/23/2022   Procedure: POLYPECTOMY;  Surgeon: Sherrilyn Rist, MD;  Location: [facility] ENDOSCOPY;  Service: Gastroenterology;;   STUMP REVISION Left 06/27/2021   Procedure:  REVISION LEFT BELOW KNEE AMPUTATION;  Surgeon: Nadara Mustard, MD;  Location: [facility] OR;  Service: Orthopedics;  Laterality: Left;   SUBMUCOSAL LIFTING INJECTION  06/22/2022   Procedure: SUBMUCOSAL LIFTING INJECTION;  Surgeon: Meridee Score Netty Starring., MD;  Location: [facility] ENDOSCOPY;  Service: Gastroenterology;;   SUBMUCOSAL TATTOO INJECTION  06/22/2022   Procedure: SUBMUCOSAL TATTOO INJECTION;  Surgeon: Lemar Lofty., MD;  Location: [facility] ENDOSCOPY;  Service: Gastroenterology;;   TRANSURETHRAL RESECTION OF PROSTATE N/A 06/28/2020   Procedure: TRANSURETHRAL RESECTION OF THE PROSTATE (TURP);  Surgeon: Marcine Matar, MD;  Location: [facility] OR;  Service: Urology;  Laterality: N/A;   WISDOM TOOTH EXTRACTION     Social History:  reports that he quit smoking about 34 years ago. His smoking use included cigars. He started smoking about 50 years ago. He has never used smokeless tobacco. He reports that he does not drink alcohol and does not use drugs.  Allergies  Allergen Reactions   Morphine     Other reaction(s): Delusions (intolerance)   Mushroom Extract Complex (Do Not Select) Nausea Only    Family History  Problem Relation Age of Onset   Diabetes Mellitus II Mother    Kidney disease Mother    Diabetes Mellitus II Father    CAD Father    Cancer Father        prostate   Kidney disease Father    Diabetes Mellitus II Brother    Kidney disease Brother    Diabetes Mellitus II Brother    Stomach cancer Brother 55   Kidney disease Brother    Colon cancer Neg Hx    Colon polyps Neg Hx    Esophageal cancer Neg Hx    Rectal cancer Neg Hx    Pancreatic cancer Neg Hx     Prior to Admission medications   Medication Sig Start Date End Date Taking? Authorizing Provider  acetaminophen (TYLENOL) 325 MG tablet Take 2 tablets (650 mg total) by mouth every 4 (four) hours as needed for fever or mild pain. 08/08/22   Angiulli, Mcarthur Rossetti, PA-C  allopurinol (ZYLOPRIM) 100 MG tablet Take 2 tablets (200 mg  total) by mouth daily. 08/08/22   Angiulli, Mcarthur Rossetti, PA-C  doxycycline (VIBRA-TABS) 100 MG tablet Take 1 tablet (100 mg total) by mouth daily. 03/13/23   Nadara Mustard, MD  ferrous sulfate 325 (65 FE) MG tablet Take 325 mg by mouth 2 (two) times daily. 11/25/22   [provider]  levothyroxine (SYNTHROID) 112 MCG tablet Take 1 tablet (112 mcg total) by mouth daily before breakfast. 05/22/21  Angiulli, Mcarthur Rossetti, PA-C  losartan (COZAAR) 50 MG tablet Take 50 mg by mouth 2 (two) times daily. 08/22/22   [provider]  Methoxy PEG-Epoetin Beta (MIRCERA IJ) Inject into the skin. 08/20/22   [provider]  modafinil (PROVIGIL) 200 MG tablet Take 1 tablet (200 mg total) by mouth daily. 02/21/23   Raulkar, Drema Pry, MD  mupirocin ointment (BACTROBAN) 2 % Apply 1 Application topically 2 (two) times daily. Apply to the affected area 2 times a day 03/13/23   Nadara Mustard, MD  ondansetron (ZOFRAN) 4 MG tablet Take 4 mg by mouth every 6 (six) hours. 08/28/22   [provider]  OZEMPIC, 0.25 OR 0.5 MG/DOSE, 2 MG/3ML SOPN Inject 0.25 mg into the skin once a week. 09/19/21   [provider]  pantoprazole (PROTONIX) 40 MG tablet TAKE 1 TABLET BY MOUTH EVERY DAY 04/28/23   Hilarie Fredrickson, MD  pregabalin (LYRICA) 150 MG capsule Take 150 mg by mouth 3 (three) times daily.    [provider]  propranolol ER (INDERAL LA) 80 MG 24 hr capsule TAKE 1 CAPSULE BY MOUTH EVERY DAY 12/10/22   Yates Decamp, MD  rosuvastatin (CRESTOR) 10 MG tablet Take 1 tablet (10 mg total) by mouth daily. 08/08/22   Angiulli, Mcarthur Rossetti, PA-C  tiZANidine (ZANAFLEX) 2 MG tablet TAKE 1 TABLET (2MG  TOTAL) BY MOUTH TWICE A DAY AS NEEDED FOR MUSCLE SPASM 01/24/23   Adonis Huguenin, NP  torsemide (DEMADEX) 20 MG tablet TAKE 1 TABLET BY MOUTH TWICE A DAY 11/29/22   Yates Decamp, MD  Vitamin D, Ergocalciferol, (DRISDOL) 1.25 MG (50000 UNIT) CAPS capsule Take 1 capsule (50,000 Units total) by mouth every 7 (seven)  days. 08/08/22   Charlton Amor, PA-C    Physical Exam: Vitals:   05/05/23 0848 05/05/23 0900 05/05/23 0915 05/05/23 1116  BP:  (!) 132/52  (!) 130/98  Pulse:  (!) 57 63 68  Resp:  17 20 16   Temp:    97.7 F (36.5 C)  TempSrc:    Oral  SpO2: 96% 97% 98% 98%    Constitutional: Elderly male who appears ill, but in no acute distress Eyes: PERRL, lids and conjunctivae normal ENMT: Mucous membranes are moist.  Normal dentition.  Neck: normal, supple, JVD present. Respiratory: Decreased overall aeration without significant wheezes or rhonchi appreciated.  O2 saturations currently maintained on room air.  Cardiovascular: Regular rate and rhythm, no murmurs / rubs / gallops.  No lower extremity edema. Left upper extremity fistula in place. Abdomen: Distended abdomen without significant tenderness to palpation.  Bowel sounds positive.   Musculoskeletal: no clubbing / cyanosis.  Bilateral BKA with legs currently in prosthesis Skin: no rashes, lesions, ulcers.  Neurologic: CN 2-12 grossly intact.  Strength 5/5 in all 4.  Psychiatric: Normal judgment and insight. Alert and oriented x 3. Normal mood.   Data Reviewed:  EKG noted atrial fibrillation at 69 bpm.  Reviewed labs, imaging, and pertinent records as documented.  Assessment and Plan:  Acute respiratory failure with hypoxia Suspected sepsis secondary community-acquired pneumonia Patient presents with complaints of progressively worsening productive cough.  O2 saturations initially noted to be as low as 86% on room air with improvement on 2 L of nasal cannula oxygen. In the ED patient was found to be febrile up to 101.4 F with tachypnea meeting SIRS criteria.  Chest x-ray was otherwise noted to be clear.  Influenza, COVID-19, and RSV screening were negative.  Lactic acid was  reassuring at 1.8->1.1 blood cultures have been obtained.  Patient had been started on empiric antibiotics with Rocephin and azithromycin. -Admit to a telemetry  bed -Follow-up blood culture with -Nasal cannula oxygen as needed to maintain O2 saturation greater than 92% -Incentive spirometry and flutter valve -Check procalcitonin and BNP -Continue empiric antibiotics of Rocephin and azithromycin -Mucinex  Permanent atrial fibrillation/flutter Initial EKG noted concern for atrial fibrillation 69 bpm.  Patient with history of prior ablation, but appears to be chronically in atrial fibrillation which is rate controlled.  CHA2DS2-VASc equals at least 4.  Patient not a candidate for anticoagulation due to history of bleeding diathesis. -Continue beta-blocker as tolerated  Ascites Patient noted to have abdominal distention.  Previously scheduled for paracentesis tomorrow.  He is followed in the outpatient setting by Dr. Marina Goodell of gastroenterology.  Previous workup did not find a liver cause for his ascites and symptoms were thought secondary to cardiac and/or renal disease and last seen in 10/2022. -IR consulted for paracentesis -Fluid analysis and culture   ESRD on HD Patient normally dialyzes on Monday, Wednesday, and Saturdays.  Labs noted potassium 5, BUN 49, creatinine 5.81.   -Nephrology consulted for need of dialysis  Hyponatremia Chronic.  Initial sodium noted to be 131 which appears similar to prior.  Possibly secondary to patient having excess fluid present. -Recheck sodium levels in a.m.  Anemia of chronic kidney disease Chronic.  Hemoglobin 10.6 which appears around patient's baseline. -Continue to monitor  Thrombocytopenia Chronic.  Platelet count 119 which appears higher than previous.  Patient denied any reports of bleeding. -Continue to monitor  Essential hypertension Initially elevated up to 151/103 -Continue current medication regimen as tolerated  Diabetes mellitus type 2, without long-term use of insulin Patient is diet controlled.  Initial glucose was 79.  Last hemoglobin A1c was noted to be 5 when checked 05/2022.  Patient  requested that diet restrictions be lifted and he be placed on 9 regular diet.  Coronary artery disease s/p CABG Hyperlipidemia -Continue Crestor  Hypothyroidism -Continue levothyroxine  Status post bilateral BKA Neuropathy  -Continue Lyrica  DVT prophylaxis: Heparin Advance Care Planning:   Code Status: Full Code   Consults: Nephrology Family Communication: none  Severity of Illness: The appropriate patient status for this patient is INPATIENT. Inpatient status is judged to be reasonable and necessary in order to provide the required intensity of service to ensure the patient's safety. The patient's presenting symptoms, physical exam findings, and initial radiographic and laboratory data in the context of their chronic comorbidities is felt to place them at high risk for further clinical deterioration. Furthermore, it is not anticipated that the patient will be medically stable for discharge from the hospital within 2 midnights of admission.   * I certify that at the point of admission it is my clinical judgment that the patient will require inpatient hospital care spanning beyond 2 midnights from the point of admission due to high intensity of service, high risk for further deterioration and high frequency of surveillance required.*  Author: [name], MD 05/05/2023 11:29 AM  For on call review [email or web address].

## 2023-05-06 ENCOUNTER — Other Ambulatory Visit (HOSPITAL_COMMUNITY): Payer: Medicare Other

## 2023-05-06 DIAGNOSIS — J189 Pneumonia, unspecified organism: Secondary | ICD-10-CM | POA: Diagnosis not present

## 2023-05-06 DIAGNOSIS — A419 Sepsis, unspecified organism: Secondary | ICD-10-CM | POA: Diagnosis not present

## 2023-05-06 LAB — CBC
HCT: 26.1 % — ABNORMAL LOW (ref 39.0–52.0)
Hemoglobin: 8.4 g/dL — ABNORMAL LOW (ref 13.0–17.0)
MCH: 28.4 pg (ref 26.0–34.0)
MCHC: 32.2 g/dL (ref 30.0–36.0)
MCV: 88.2 fL (ref 80.0–100.0)
Platelets: 82 10*3/uL — ABNORMAL LOW (ref 150–400)
RBC: 2.96 MIL/uL — ABNORMAL LOW (ref 4.22–5.81)
RDW: 18.9 % — ABNORMAL HIGH (ref 11.5–15.5)
WBC: 5.9 10*3/uL (ref 4.0–10.5)
nRBC: 0 % (ref 0.0–0.2)

## 2023-05-06 LAB — PATHOLOGIST SMEAR REVIEW

## 2023-05-06 LAB — RENAL FUNCTION PANEL
Albumin: 2.6 g/dL — ABNORMAL LOW (ref 3.5–5.0)
Anion gap: 14 (ref 5–15)
BUN: 71 mg/dL — ABNORMAL HIGH (ref 8–23)
CO2: 21 mmol/L — ABNORMAL LOW (ref 22–32)
Calcium: 8.2 mg/dL — ABNORMAL LOW (ref 8.9–10.3)
Chloride: 96 mmol/L — ABNORMAL LOW (ref 98–111)
Creatinine, Ser: 8.08 mg/dL — ABNORMAL HIGH (ref 0.61–1.24)
GFR, Estimated: 6 mL/min — ABNORMAL LOW (ref >60)
Glucose, Bld: 113 mg/dL — ABNORMAL HIGH (ref 70–99)
Phosphorus: 5 mg/dL — ABNORMAL HIGH (ref 2.5–4.6)
Potassium: 4.5 mmol/L (ref 3.5–5.1)
Sodium: 131 mmol/L — ABNORMAL LOW (ref 135–145)

## 2023-05-06 MED ORDER — LEVOFLOXACIN 750 MG PO TABS: 750.0000 mg | ORAL_TABLET | Freq: Every day | ORAL | 0 refills | Status: AC

## 2023-05-06 MED ORDER — GUAIFENESIN ER 600 MG PO TB12: 600.0000 mg | ORAL_TABLET | Freq: Two times a day (BID) | ORAL | 0 refills | Status: AC

## 2023-05-06 MED ORDER — DARBEPOETIN ALFA 100 MCG/0.5ML IJ SOSY
100.0000 ug | PREFILLED_SYRINGE | INTRAMUSCULAR | Status: DC
  Filled 2023-05-06: qty 0.5

## 2023-05-06 MED ORDER — HYDROCODONE BIT-HOMATROP MBR 5-1.5 MG/5ML PO SOLN
5.0000 mL | ORAL | Status: DC | PRN
  Administered 2023-05-06 (×3): 5 mL via ORAL
  Filled 2023-05-06 (×3): qty 5

## 2023-05-06 MED ORDER — HYDROCODONE BIT-HOMATROP MBR 5-1.5 MG/5ML PO SOLN: 5.0000 mL | ORAL | 0 refills | Status: DC | PRN

## 2023-05-06 NOTE — Progress Notes (Signed)
Pt refused HD due to d/c home HD

## 2023-05-06 NOTE — Progress Notes (Signed)
Nephrology Follow-Up Consult note   Assessment/Recommendations: [name] is [name]/an 73 y.o. male with [name] past medical history significant for ESRD, admitted for pneumonia.       # ESRD: Last dialysis on Friday.  Plan on dialysis today maintain TTS schedule while here   # Volume/ hypertension: EDW 88kg. Attempt to achieve EDW as tolerated. Can cont home BP meds.   # Anemia of Chronic Kidney Disease: Hemoglobin lower at 8.4 today.  Monitor for any signs of bleeding.  No IV iron because of infection concerns.  Will redose ESA today   # Secondary Hyperparathyroidism/Hyperphosphatemia: CCa at goal. Phos at goal. Cont home calcitriol 0.37mcg 3x weekly and sevelamer with meals   # Vascular access: AVF with no issues   # Pneumonia: on CT scan. Abx per primary   # Cirrhosis/Ascites: LVP on 1/27. Mgmt per primary   Recommendations conveyed to primary service.    [name] [facility] 05/06/2023 9:37 AM  ___________________________________________________________  CC: cough  Interval History/Subjective: Persistent cough.  Minimal shortness of breath.  3.3 L of volume removed on paracentesis yesterday.   Medications:  Current Facility-Administered Medications  Medication Dose Route Frequency Provider Last Rate Last Admin   acetaminophen (TYLENOL) tablet 650 mg  650 mg Oral Q6H PRN Jon Braun, MD       Or   acetaminophen (TYLENOL) suppository 650 mg  650 mg Rectal Q6H PRN Jon Flavors A, MD       albuterol (PROVENTIL) (2.5 MG/3ML) 0.083% nebulizer solution 2.5 mg  2.5 mg Nebulization Q2H PRN Jon Flavors A, MD       allopurinol (ZYLOPRIM) tablet 200 mg  200 mg Oral Daily [name], Jon A, MD   200 mg at 05/05/23 2135   alteplase (CATHFLO ACTIVASE) injection 2 mg  2 mg Intracatheter Once PRN Jon Level, MD       azithromycin (ZITHROMAX) 500 mg in sodium chloride 0.9 % 250 mL IVPB  500 mg Intravenous Q24H [name], Jon A, MD 250 mL/hr at 05/05/23  1849 500 mg at 05/05/23 1849   [START ON 05/07/2023] calcitRIOL (ROCALTROL) capsule 0.5 mcg  0.5 mcg Oral Once per day on Monday Wednesday Friday Jon Flavors A, MD       cefTRIAXone (ROCEPHIN) 2 g in sodium chloride 0.9 % 100 mL IVPB  2 g Intravenous Q24H [name], Jon A, MD 200 mL/hr at 05/05/23 1718 2 g at 05/05/23 1718   Chlorhexidine Gluconate Cloth 2 % PADS 6 each  6 each Topical Q0600 Jon Level, MD       guaiFENesin (MUCINEX) 12 hr tablet 600 mg  600 mg Oral BID Jon Flavors A, MD   600 mg at 05/05/23 2134   heparin injection 1,000 Units  1,000 Units Dialysis PRN Jon Level, MD       heparin injection 5,000 Units  5,000 Units Subcutaneous Q8H [name], Jon A, MD       HYDROcodone bit-homatropine (HYCODAN) 5-1.5 MG/5ML syrup 5 mL  5 mL Oral Q4H PRN [name], Jon B, DO   5 mL at 05/06/23 1610   ketoconazole (NIZORAL) 2 % cream 1 Application  1 Application Topical BID PRN Jon Braun, MD       levothyroxine (SYNTHROID) tablet 112 mcg  112 mcg Oral QAC breakfast [name], Jon A, MD       lidocaine (PF) (XYLOCAINE) 1 % injection 5 mL  5 mL Intradermal PRN Jon Level, MD  lidocaine-prilocaine (EMLA) cream 1 Application  1 Application Topical PRN Jon Level, MD       modafinil (PROVIGIL) tablet 200 mg  200 mg Oral Daily [name], Jon A, MD   200 mg at 05/05/23 2136   pentafluoroprop-tetrafluoroeth (GEBAUERS) aerosol 1 Application  1 Application Topical PRN Jon Level, MD       pregabalin (LYRICA) capsule 150 mg  150 mg Oral TID Jon Flavors A, MD   150 mg at 05/05/23 2135   propranolol (INDERAL) tablet 40 mg  40 mg Oral Daily [name], Jon A, MD       rosuvastatin (CRESTOR) tablet 10 mg  10 mg Oral Daily [name], Jon A, MD   10 mg at 05/05/23 2135   sevelamer carbonate (RENVELA) tablet 800 mg  800 mg Oral TID WC [name], Jon A, MD   800 mg at 05/06/23 0819   sodium chloride flush (NS) 0.9 % injection 3 mL  3 mL Intravenous Q12H [name],  Jon A, MD   3 mL at 05/05/23 2137   torsemide (DEMADEX) tablet 20 mg  20 mg Oral BID Jon Flavors A, MD   20 mg at 05/06/23 4098      Review of Systems: 10 systems reviewed and negative except per interval history/subjective  Physical Exam: Vitals:   05/05/23 2351 05/06/23 0849  BP: (!) 136/51 (!) 109/50  Pulse: 72 63  Resp: 17 16  Temp: 98.3 F (36.8 C)   SpO2: 98% 95%   No intake/output data recorded. No intake or output data in the 24 hours ending 05/06/23 1191 Constitutional: well-appearing, no acute distress ENMT: ears and nose without scars or lesions, MMM CV: normal rate, trace edema Respiratory: bilateral chest rise, cough, no iwob Gastrointestinal: soft, non-tender, no palpable masses or hernias Skin: no visible lesions or rashes Psych: alert, judgement/insight appropriate, appropriate mood and affect   Test Results I personally reviewed new and old clinical labs and radiology tests Lab Results  Component Value Date   NA 131 (L) 05/06/2023   K 4.5 05/06/2023   CL 96 (L) 05/06/2023   CO2 21 (L) 05/06/2023   BUN 71 (H) 05/06/2023   CREATININE 8.08 (H) 05/06/2023   GFR 7.03 (LL) 10/11/2022   CALCIUM 8.2 (L) 05/06/2023   ALBUMIN 2.6 (L) 05/06/2023   PHOS 5.0 (H) 05/06/2023    CBC Recent Labs  Lab 05/04/23 1620 05/06/23 0557  WBC 9.4 5.9  NEUTROABS 8.3*  --   HGB 10.6* 8.4*  HCT 34.1* 26.1*  MCV 88.1 88.2  PLT 119* 82*

## 2023-05-06 NOTE — Progress Notes (Signed)
Called dialysis to make them aware pt will be getting discharged and complete dialysis at home.

## 2023-05-06 NOTE — Discharge Summary (Signed)
Physician Discharge Summary  [name] IEP:[id number] DOB: 1950/07/18 DOA: 05/04/2023  PCP: Adolph Pollack, FNP  Admit date: 05/04/2023 Discharge date: 05/06/2023  Admitted From: Home Disposition: Home  Recommendations for Outpatient Follow-up:  Follow up with PCP in 1-2 weeks Continue Levaquin to complete antibiotic course for community-acquired pneumonia Follow-up finalized ascitic fluid culture showing no growth at time of discharge.   Home Health: No Equipment/Devices: None  Discharge Condition: Stable CODE STATUS: Full code Diet recommendation: Renal diet with 1200 mL fluid restriction  History of present illness:  [name] is a 73 year old male with past medical history significant for CAD s/p CABG, DM type II, ESRD on HD,osteomyelitis s/p bilateral BKA, history of GI bleed and GERD who presented to [facility] ED on 1/26presents with complaint of severe cough. The cough has been been present for approximately two to three days and is productive. The patient initially denied any other symptoms besides the cough, but  also reported feeling fatigued.  He makes note that he was scheduled to have a paracentesis tomorrow, and that his abdomen has been very distended.  Normally dialyzes on Monday, Wednesday, and Saturday afternoons and is due for dialysis today.   The patient also reported discomfort in his amputation stumps, which is usually managed with Pregabalin (Lyrica) 150mg  three times daily. However, he had not taken this medication in almost 2 days now.   In the emergency department patient was noted to be febrile up to 101.4 F with respirations elevated up to 24, O2 saturations noted to be as low as 86% with improvement on 2 L nasal cannula oxygen.  Labs significant for hemoglobin 10.6, platelets 119, sodium 131, chloride 89, BUN 49, creatinine 5.81, lactic acid 1.8-> 1.1.  Chest x-ray noted low lung volumes with no active disease.  Influenza, COVID-19, and RSV screening were  negative blood cultures have been obtained.  Patient had been given acetaminophen 650 mg, 1 L lactated Ringer's, Rocephin, and azithromycin.  TRH consulted for admission for further evaluation and management of acute hypoxic respite failure secondary to community-acquired pneumonia.  Hospital course:  Acute hypoxic respiratory failure, POA Community-acquired pneumonia Patient presents with complaints of progressively worsening productive cough.  O2 saturations initially noted to be as low as 86% on room air with improvement on 2 L of nasal cannula oxygen. In the ED patient was found to be febrile up to 101.4 F with tachypnea meeting SIRS criteria.  Chest x-ray was otherwise noted to be clear.  Influenza, COVID-19, and RSV screening were negative.  Lactic acid was reassuring at 1.8->1.1 blood cultures have been obtained.  CT chest/abdomen/pelvis with patchy airspace disease lingula, left greater than right consistent with pneumonia.  Patient had been started on empiric antibiotics with Rocephin and azithromycin.  Patient was slowly weaned off of supple oxygen.  Patient will continue antibiotics and discharged with Levaquin to complete 7-day course.   Permanent atrial fibrillation/flutter Initial EKG noted concern for atrial fibrillation 69 bpm.  Patient with history of prior ablation, but appears to be chronically in atrial fibrillation which is rate controlled.  CHA2DS2-VASc equals at least 4.  Patient not a candidate for anticoagulation due to history of bleeding diathesis.  Continue propranolol.   Ascites Patient noted to have abdominal distention.  Previously scheduled for paracentesis tomorrow.  He is followed in the outpatient setting by Dr. Marina Goodell of gastroenterology.  Previous workup did not find a liver cause for his ascites and symptoms were thought secondary to cardiac and/or renal  disease and last seen in 10/2022.  Patient underwent IR ultrasound-guided paracentesis on 1/27 with 3.3 L removed.   Ascitic fluid culture with no organisms on Gram stain, no growth less than 24 hours at time of discharge.  Continue home torsemide 20 mg p.o. twice daily.  Outpatient follow-up with PCP, volume management with hemodialysis.   ESRD on HD Patient normally dialyzes on Monday, Wednesday, and Saturdays utilizing home HD unit.  Outpatient follow-up with nephrology.   Hyponatremia, chronic Chronic.  Initial sodium noted to be 131 which appears similar to prior.  Possibly secondary to patient having excess fluid present.    Anemia of chronic kidney disease Hemoglobin 10.6 which appears around patient's baseline.   Thrombocytopenia, chronic Patient denied any reports of bleeding.  Essential hypertension Continue propranolol 40 mg p.o. daily, torsemide 20 mg p.o. twice daily.   Diabetes mellitus type 2, without long-term use of insulin Patient is diet controlled.  Initial glucose was 79.  Last hemoglobin A1c was noted to be 5 when checked 05/2022.  Patient requested that diet restrictions be lifted and he be placed on 9 regular diet.   Coronary artery disease s/p CABG Hyperlipidemia Continue Crestor   Hypothyroidism Continue levothyroxine   Status post bilateral BKA Neuropathy  Continue Lyrica   Discharge Diagnoses:  Principal Problem:   Sepsis due to pneumonia [facility]) Active Problems:   Acute respiratory failure with hypoxia (HCC)   Permanent atrial fibrillation (HCC)   Ascites   CAD (coronary artery disease)   ESRD on dialysis (HCC)   Hyponatremia   Anemia of chronic disease   Thrombocytopenia (HCC)   Essential hypertension   Hyperlipidemia associated with type 2 diabetes mellitus (HCC)   S/P CABG (coronary artery bypass graft)   Hypothyroidism   Neuropathy (HCC)    Discharge Instructions  Discharge Instructions     Call MD for:  difficulty breathing, headache or visual disturbances   Complete by: As directed    Call MD for:  extreme fatigue   Complete by: As directed     Call MD for:  persistant dizziness or light-headedness   Complete by: As directed    Call MD for:  persistant nausea and vomiting   Complete by: As directed    Call MD for:  severe uncontrolled pain   Complete by: As directed    Call MD for:  temperature >100.4   Complete by: As directed    Diet - low sodium heart healthy   Complete by: As directed    Increase activity slowly   Complete by: As directed    No wound care   Complete by: As directed       Allergies as of 05/06/2023       Reactions   Morphine Other (See Comments)   Other reaction(s): Delusions (intolerance)   Mushroom Extract Complex (do Not Select) Nausea Only        Medication List     STOP taking these medications    doxycycline 100 MG tablet Commonly known as: VIBRA-TABS   ferrous sulfate 325 (65 FE) MG tablet   ondansetron 4 MG tablet Commonly known as: ZOFRAN       TAKE these medications    acetaminophen 325 MG tablet Commonly known as: TYLENOL Take 2 tablets (650 mg total) by mouth every 4 (four) hours as needed for fever or mild pain.   allopurinol 100 MG tablet Commonly known as: ZYLOPRIM Take 2 tablets (200 mg total) by mouth daily.   calcitRIOL  0.5 MCG capsule Commonly known as: ROCALTROL Take 0.5 mcg by mouth 3 (three) times a week.   guaiFENesin 600 MG 12 hr tablet Commonly known as: MUCINEX Take 1 tablet (600 mg total) by mouth 2 (two) times daily for 14 days.   HYDROcodone bit-homatropine 5-1.5 MG/5ML syrup Commonly known as: HYCODAN Take 5 mLs by mouth every 4 (four) hours as needed for cough.   ketoconazole 2 % cream Commonly known as: NIZORAL Apply 1 Application topically 2 (two) times daily as needed for irritation.   levofloxacin 750 MG tablet Commonly known as: Levaquin Take 1 tablet (750 mg total) by mouth daily for 5 days.   levothyroxine 112 MCG tablet Commonly known as: SYNTHROID Take 1 tablet (112 mcg total) by mouth daily before breakfast.   MIRCERA  IJ Inject into the skin.   modafinil 200 MG tablet Commonly known as: PROVIGIL Take 1 tablet (200 mg total) by mouth daily.   Ozempic (0.25 or 0.5 MG/DOSE) 2 MG/3ML Sopn Generic drug: Semaglutide(0.25 or 0.5MG /DOS) Inject 0.25 mg into the skin once a week.   pantoprazole 40 MG tablet Commonly known as: PROTONIX TAKE 1 TABLET BY MOUTH EVERY DAY   pregabalin 150 MG capsule Commonly known as: LYRICA Take 150 mg by mouth 3 (three) times daily.   propranolol 40 MG tablet Commonly known as: INDERAL Take 40 mg by mouth daily.   rosuvastatin 10 MG tablet Commonly known as: CRESTOR Take 1 tablet (10 mg total) by mouth daily.   sevelamer carbonate 800 MG tablet Commonly known as: RENVELA Take 800 mg by mouth 3 (three) times daily.   torsemide 20 MG tablet Commonly known as: DEMADEX TAKE 1 TABLET BY MOUTH TWICE A DAY        Follow-up Information     Adolph Pollack, FNP. Schedule an appointment as soon as possible for a visit in 1 week(s).   Specialty: Family Medicine Contact information: [phone] [place] [address] [phone]                Allergies  Allergen Reactions   Morphine Other (See Comments)    Other reaction(s): Delusions (intolerance)   Mushroom Extract Complex (Do Not Select) Nausea Only    Consultations: Nephrology   Procedures/Studies: IR Paracentesis Result Date: 05/06/2023 INDICATION: Patient with history of ESRD on HD, cirrhosis with recurrent ascites who presented to the ED with AMS. CT showed moderate volume ascites. Request for diagnostic and therapeutic paracentesis. EXAM: ULTRASOUND GUIDED DIAGNOSTIC AND THERAPEUTIC PARACENTESIS MEDICATIONS: 7 mL 1% lidocaine COMPLICATIONS: None immediate. PROCEDURE: Informed written consent was obtained from the patient after a discussion of the risks, benefits and alternatives to treatment. A timeout was performed prior to the initiation of the procedure. Initial  ultrasound scanning demonstrates a large amount of ascites within the right lower abdominal quadrant. The right lower abdomen was prepped and draped in the usual sterile fashion. 1% lidocaine was used for local anesthesia. Following this, a 19 gauge, 7-cm, Yueh catheter was introduced. An ultrasound image was saved for documentation purposes. The paracentesis was performed. The catheter was removed and a dressing was applied. The patient tolerated the procedure well without immediate post procedural complication. FINDINGS: A total of approximately 3.3 L of golden yellow fluid was removed. Samples were sent to the laboratory as requested by the clinical team. IMPRESSION: Successful ultrasound-guided paracentesis yielding 3.3 liters of peritoneal fluid. Performed by Lynnette Caffey, PA-C Electronically Signed   By: Rudene Anda.D.  On: 05/06/2023 10:12   CT CHEST ABDOMEN PELVIS WO CONTRAST Result Date: 05/04/2023 CLINICAL DATA:  Sepsis fever, cough, hx of CKD and ascites EXAM: CT CHEST, ABDOMEN AND PELVIS WITHOUT CONTRAST TECHNIQUE: Multidetector CT imaging of the chest, abdomen and pelvis was performed following the standard protocol without IV contrast. RADIATION DOSE REDUCTION: This exam was performed according to the departmental dose-optimization program which includes automated exposure control, adjustment of the mA and/or kV according to patient size and/or use of iterative reconstruction technique. COMPARISON:  Radiograph earlier today.  Abdominopelvic CT 10/08/2022 FINDINGS: CT CHEST FINDINGS Cardiovascular: The heart is mildly enlarged. Post CABG with calcification of native coronary arteries. Aortic atherosclerosis without aneurysm. Dilated main pulmonary artery at 3.6 cm. No pericardial effusion. Mediastinum/Nodes: No mediastinal adenopathy. Unremarkable appearance of the esophagus. Lungs/Pleura: Patchy and nodular airspace disease in the lingula and left greater than right lower lobes.  Breathing motion artifact limits detailed parenchymal assessment. No pleural fluid. No convincing debris in the trachea or central airways. Musculoskeletal: Prior median sternotomy. Diffuse thoracic spondylosis with spurring. There are no acute or suspicious osseous abnormalities. CT ABDOMEN PELVIS FINDINGS Hepatobiliary: Nodular hepatic contours suspicious for cirrhosis. Tiny subcentimeter hypodensity in the right lobe of the liver, series 2, image 59, unchanged. A second subcentimeter hypodensity on prior is not definitively seen. Cholecystectomy. No biliary dilatation. Pancreas: No peripancreatic inflammation. Punctate calcifications in the pancreatic body and tail as before. No ductal dilatation. Spleen: Mildly enlarged, 13.4 cm greatest dimension. Adrenals/Urinary Tract: Stable 3.9 x 3.2 cm heterogeneous right adrenal nodule. This contains macroscopic fat consistent with benign myelolipoma. No further follow-up imaging is recommended. Slight left adrenal thickening without nodule, unchanged. Bilateral renal parenchymal atrophy. Bilateral renal cysts. No further follow-up imaging is recommended. Urinary bladder is near completely empty. Stomach/Bowel: Bowel assessment is limited in the absence of contrast and presence of ascites. No abnormal gastric distension. Clips in the duodenum as before. Mild gaseous distension of small bowel in the central abdomen in a nonobstructive pattern. There is also mild gaseous distension of ascending and transverse colon. Sigmoid diverticulosis without diverticulitis. No obvious bowel inflammation is seen. Vascular/Lymphatic: Aortic and branch atherosclerosis. No aortic aneurysm. No gross lymphadenopathy. Reproductive: Prominent prostate with small clips. Other: Moderate volume abdominopelvic ascites. Some of this fluid appears minimally complex, however this is felt to be related to artifact due to overlying monitoring devices and arms down positioning. There is no hematocrit  level to suggest hemoperitoneum. No free air. Generalized body wall edema. Musculoskeletal: There is chronic endplate irregularity at L3-L4 as well as chronic Schmorl's node within superior endplate of L2. No acute osseous findings. IMPRESSION: 1. Patchy and nodular airspace disease in the lingula and left greater than right lower lobes, suspicious for pneumonia. 2. Cirrhosis with moderate volume abdominopelvic ascites. Mild splenomegaly. 3. Mild gaseous distension of small bowel and colon in a nonobstructive pattern, may represent ileus. 4. Sigmoid diverticulosis without diverticulitis. 5. Dilated main pulmonary artery, can be seen with pulmonary arterial hypertension. Aortic Atherosclerosis (ICD10-I70.0). Electronically Signed   By: Narda Rutherford M.D.   On: 05/04/2023 18:00   DG Chest Port 1 View Result Date: 05/04/2023 CLINICAL DATA:  Questionable sepsis - evaluate for abnormality Patient's wife states he woke up screaming and coughing, also c/o abd pain. Patient is lethargic and weak. Hx diabetes EXAM: PORTABLE CHEST 1 VIEW COMPARISON:  Chest x-ray 07/24/2022 FINDINGS: The heart and mediastinal contours are within normal limits. Surgical hardware of right the mediastinum. Atherosclerotic plaque Low lung volumes. No focal  consolidation. Chronic coarsened interstitial markings with no overt pulmonary edema. No pleural effusion. No pneumothorax. No acute osseous abnormality.  Intact sternotomy wires. IMPRESSION: 1. Low lung volumes with no active disease. 2.  Aortic Atherosclerosis (ICD10-I70.0). Electronically Signed   By: Tish Frederickson M.D.   On: 05/04/2023 17:09     Subjective: Patient seen examined bedside, resting calmly.  Sitting in bedside chair.  Wishes to discharge home today and will do his own dialysis with his home unit.  Patient has been titrated off of oxygen, other than cough feels relatively well.  Asking about returning to physical therapy.  No other specific questions, concerns or  complaints at this time.  Denies headache, no dizziness, no chest pain, no palpitations, no fever/chills/night sweats, no nausea/vomiting/diarrhea, no focal weakness, no fatigue, no paresthesias.  No acute events overnight per nurse staff.  Discharge Exam: Vitals:   05/05/23 2351 05/06/23 0849  BP: (!) 136/51 (!) 109/50  Pulse: 72 63  Resp: 17 16  Temp: 98.3 F (36.8 C)   SpO2: 98% 95%   Vitals:   05/05/23 1503 05/05/23 2136 05/05/23 2351 05/06/23 0849  BP: (!) 143/61 (!) 127/54 (!) 136/51 (!) 109/50  Pulse:  68 72 63  Resp:  17 17 16   Temp:  98.3 F (36.8 C) 98.3 F (36.8 C)   TempSrc:  Oral Oral   SpO2:  97% 98% 95%  Weight:      Height:        Physical Exam: GEN: NAD, alert and oriented x 3, chronically ill appearance HEENT: NCAT, PERRL, EOMI, sclera clear, MMM PULM: Breath slight diminished bilateral bases without wheezing/crackles/rhonchi, normal respiratory effort without accessory muscle use, on room air with SpO2 98% CV: RRR w/o M/G/R GI: abd soft, NTND, NABS, no R/G/M MSK: Moves all extremities independently, noted bilateral BKA's with prosthesis in place NEURO: No focal neurological deficit PSYCH: normal mood/affect Integumentary: No concerning rashes/lesions/wounds noted on exposed skin surfaces    The results of significant diagnostics from this hospitalization (including imaging, microbiology, ancillary and laboratory) are listed below for reference.     Microbiology: Recent Results (from the past 240 hours)  Resp panel by RT-PCR (RSV, Flu A&B, Covid) Anterior Nasal Swab     Status: None   Collection Time: 05/04/23  3:50 PM   Specimen: Anterior Nasal Swab  Result Value Ref Range Status   SARS Coronavirus 2 by RT PCR NEGATIVE NEGATIVE Final    Comment: (NOTE) SARS-CoV-2 target nucleic acids are NOT DETECTED.  The SARS-CoV-2 RNA is generally detectable in upper respiratory specimens during the acute phase of infection. The lowest concentration of  SARS-CoV-2 viral copies this assay can detect is 138 copies/mL. A negative result does not preclude SARS-Cov-2 infection and should not be used as the sole basis for treatment or other patient management decisions. A negative result may occur with  improper specimen collection/handling, submission of specimen other than nasopharyngeal swab, presence of viral mutation(s) within the areas targeted by this assay, and inadequate number of viral copies(<138 copies/mL). A negative result must be combined with clinical observations, patient history, and epidemiological information. The expected result is Negative.  Fact Sheet for Patients:  BloggerCourse.com  Fact Sheet for Healthcare Providers:  SeriousBroker.it  This test is no t yet approved or cleared by the [place] FDA and  has been authorized for detection and/or diagnosis of SARS-CoV-2 by FDA under an Emergency Use Authorization (EUA). This EUA will remain  in effect (meaning this test can  be used) for the duration of the COVID-19 declaration under Section 564(b)(1) of the Act, 21 U.S.C.section 360bbb-3(b)(1), unless the authorization is terminated  or revoked sooner.       Influenza A by PCR NEGATIVE NEGATIVE Final   Influenza B by PCR NEGATIVE NEGATIVE Final    Comment: (NOTE) The Xpert Xpress SARS-CoV-2/FLU/RSV plus assay is intended as an aid in the diagnosis of influenza from Nasopharyngeal swab specimens and should not be used as a sole basis for treatment. Nasal washings and aspirates are unacceptable for Xpert Xpress SARS-CoV-2/FLU/RSV testing.  Fact Sheet for Patients: BloggerCourse.com  Fact Sheet for Healthcare Providers: SeriousBroker.it  This test is not yet approved or cleared by the [place] FDA and has been authorized for detection and/or diagnosis of SARS-CoV-2 by FDA under an Emergency Use  Authorization (EUA). This EUA will remain in effect (meaning this test can be used) for the duration of the COVID-19 declaration under Section 564(b)(1) of the Act, 21 U.S.C. section 360bbb-3(b)(1), unless the authorization is terminated or revoked.     Resp Syncytial Virus by PCR NEGATIVE NEGATIVE Final    Comment: (NOTE) Fact Sheet for Patients: BloggerCourse.com  Fact Sheet for Healthcare Providers: SeriousBroker.it  This test is not yet approved or cleared by the [place] FDA and has been authorized for detection and/or diagnosis of SARS-CoV-2 by FDA under an Emergency Use Authorization (EUA). This EUA will remain in effect (meaning this test can be used) for the duration of the COVID-19 declaration under Section 564(b)(1) of the Act, 21 U.S.C. section 360bbb-3(b)(1), unless the authorization is terminated or revoked.  Performed at [facility], [address], [place], [place] [address]   Blood Culture (routine x 2)     Status: None (Preliminary result)   Collection Time: 05/04/23  4:06 PM   Specimen: BLOOD RIGHT FOREARM  Result Value Ref Range Status   Specimen Description   Final    BLOOD RIGHT FOREARM Performed at [facility], [address], [place], [place] [address]    Special Requests   Final    Blood Culture adequate volume BOTTLES DRAWN AEROBIC AND ANAEROBIC Performed at [facility], [address], [place], [place] [address]    Culture   Final    NO GROWTH 2 DAYS Performed at [facility], [address] [place], [place] [address]    Report Status PENDING  Incomplete  Body fluid culture w Gram Stain     Status: None (Preliminary result)   Collection Time: 05/05/23  4:15 PM   Specimen: Abdomen; Peritoneal Fluid  Result Value Ref Range Status   Specimen Description ABDOMEN  Final   Special Requests NONE  Final   Gram Stain NO WBC SEEN NO ORGANISMS  SEEN   Final   Culture   Final    NO GROWTH < 24 HOURS Performed at [facility], [address] [place], [place] [address]    Report Status PENDING  Incomplete     Labs: BNP (last 3 results) Recent Labs    05/05/23 1642  BNP 1,579.9*   Basic Metabolic Panel: Recent Labs  Lab 05/04/23 1620 05/05/23 1642 05/06/23 0557  NA 131*  --  131*  K 5.0  --  4.5  CL 89*  --  96*  CO2 27  --  21*  GLUCOSE 79  --  113*  BUN 49*  --  71*  CREATININE 5.81*  --  8.08*  CALCIUM 9.3  --  8.2*  MG  --  1.7  --   PHOS  --   --  5.0*   Liver Function Tests: Recent Labs  Lab 05/04/23 1620 05/06/23 0557  AST 13*  --   ALT 6  --   ALKPHOS 145*  --   BILITOT 1.9*  --   PROT 6.7  --   ALBUMIN 4.5 2.6*   No results for input(s): "LIPASE", "AMYLASE" in the last 168 hours. No results for input(s): "AMMONIA" in the last 168 hours. CBC: Recent Labs  Lab 05/04/23 1620 05/06/23 0557  WBC 9.4 5.9  NEUTROABS 8.3*  --   HGB 10.6* 8.4*  HCT 34.1* 26.1*  MCV 88.1 88.2  PLT 119* 82*   Cardiac Enzymes: No results for input(s): "CKTOTAL", "CKMB", "CKMBINDEX", "TROPONINI" in the last 168 hours. BNP: Invalid input(s): "POCBNP" CBG: Recent Labs  Lab 05/05/23 2134  GLUCAP 174*   D-Dimer No results for input(s): "DDIMER" in the last 72 hours. Hgb A1c No results for input(s): "HGBA1C" in the last 72 hours. Lipid Profile No results for input(s): "CHOL", "HDL", "LDLCALC", "TRIG", "CHOLHDL", "LDLDIRECT" in the last 72 hours. Thyroid function studies Recent Labs    05/05/23 1642  TSH 1.546   Anemia work up No results for input(s): "VITAMINB12", "FOLATE", "FERRITIN", "TIBC", "IRON", "RETICCTPCT" in the last 72 hours. Urinalysis    Component Value Date/Time   COLORURINE YELLOW 07/22/2022 2356   APPEARANCEUR CLEAR 07/22/2022 2356   LABSPEC 1.022 07/22/2022 2356   PHURINE 6.5 07/22/2022 2356   GLUCOSEU NEGATIVE 07/22/2022 2356   HGBUR SMALL (A) 07/22/2022 2356    BILIRUBINUR NEGATIVE 07/22/2022 2356   KETONESUR NEGATIVE 07/22/2022 2356   PROTEINUR >300 (A) 07/22/2022 2356   UROBILINOGEN 0.2 12/28/2014 1031   NITRITE NEGATIVE 07/22/2022 2356   LEUKOCYTESUR TRACE (A) 07/22/2022 2356   Sepsis Labs Recent Labs  Lab 05/04/23 1620 05/06/23 0557  WBC 9.4 5.9   Microbiology Recent Results (from the past 240 hours)  Resp panel by RT-PCR (RSV, Flu A&B, Covid) Anterior Nasal Swab     Status: None   Collection Time: 05/04/23  3:50 PM   Specimen: Anterior Nasal Swab  Result Value Ref Range Status   SARS Coronavirus 2 by RT PCR NEGATIVE NEGATIVE Final    Comment: (NOTE) SARS-CoV-2 target nucleic acids are NOT DETECTED.  The SARS-CoV-2 RNA is generally detectable in upper respiratory specimens during the acute phase of infection. The lowest concentration of SARS-CoV-2 viral copies this assay can detect is 138 copies/mL. A negative result does not preclude SARS-Cov-2 infection and should not be used as the sole basis for treatment or other patient management decisions. A negative result may occur with  improper specimen collection/handling, submission of specimen other than nasopharyngeal swab, presence of viral mutation(s) within the areas targeted by this assay, and inadequate number of viral copies(<138 copies/mL). A negative result must be combined with clinical observations, patient history, and epidemiological information. The expected result is Negative.  Fact Sheet for Patients:  BloggerCourse.com  Fact Sheet for Healthcare Providers:  SeriousBroker.it  This test is no t yet approved or cleared by the [place] FDA and  has been authorized for detection and/or diagnosis of SARS-CoV-2 by FDA under an Emergency Use Authorization (EUA). This EUA will remain  in effect (meaning this test can be used) for the duration of the COVID-19 declaration under Section 564(b)(1) of the Act,  21 U.S.C.section 360bbb-3(b)(1), unless the authorization is terminated  or revoked sooner.  Influenza A by PCR NEGATIVE NEGATIVE Final   Influenza B by PCR NEGATIVE NEGATIVE Final    Comment: (NOTE) The Xpert Xpress SARS-CoV-2/FLU/RSV plus assay is intended as an aid in the diagnosis of influenza from Nasopharyngeal swab specimens and should not be used as a sole basis for treatment. Nasal washings and aspirates are unacceptable for Xpert Xpress SARS-CoV-2/FLU/RSV testing.  Fact Sheet for Patients: BloggerCourse.com  Fact Sheet for Healthcare Providers: SeriousBroker.it  This test is not yet approved or cleared by the [place] FDA and has been authorized for detection and/or diagnosis of SARS-CoV-2 by FDA under an Emergency Use Authorization (EUA). This EUA will remain in effect (meaning this test can be used) for the duration of the COVID-19 declaration under Section 564(b)(1) of the Act, 21 U.S.C. section 360bbb-3(b)(1), unless the authorization is terminated or revoked.     Resp Syncytial Virus by PCR NEGATIVE NEGATIVE Final    Comment: (NOTE) Fact Sheet for Patients: BloggerCourse.com  Fact Sheet for Healthcare Providers: SeriousBroker.it  This test is not yet approved or cleared by the [place] FDA and has been authorized for detection and/or diagnosis of SARS-CoV-2 by FDA under an Emergency Use Authorization (EUA). This EUA will remain in effect (meaning this test can be used) for the duration of the COVID-19 declaration under Section 564(b)(1) of the Act, 21 U.S.C. section 360bbb-3(b)(1), unless the authorization is terminated or revoked.  Performed at [facility], [address], [place], [place] [address]   Blood Culture (routine x 2)     Status: None (Preliminary result)   Collection Time: 05/04/23  4:06 PM   Specimen:  BLOOD RIGHT FOREARM  Result Value Ref Range Status   Specimen Description   Final    BLOOD RIGHT FOREARM Performed at [facility], [address], [place], [place] [address]    Special Requests   Final    Blood Culture adequate volume BOTTLES DRAWN AEROBIC AND ANAEROBIC Performed at [facility], [address], [place], [place] [address]    Culture   Final    NO GROWTH 2 DAYS Performed at [facility], [address], [place], [place] [address]    Report Status PENDING  Incomplete  Body fluid culture w Gram Stain     Status: None (Preliminary result)   Collection Time: 05/05/23  4:15 PM   Specimen: Abdomen; Peritoneal Fluid  Result Value Ref Range Status   Specimen Description ABDOMEN  Final   Special Requests NONE  Final   Gram Stain NO WBC SEEN NO ORGANISMS SEEN   Final   Culture   Final    NO GROWTH < 24 HOURS Performed at [facility], [address], [place], [place] [address]    Report Status PENDING  Incomplete     Time coordinating discharge: Over 30 minutes  SIGNED:   Alvira Philips [place], DO  [facility] 05/06/2023, 2:00 PM

## 2023-05-06 NOTE — Plan of Care (Signed)

## 2023-05-06 NOTE — Progress Notes (Signed)
D/C order noted. Contacted [facility] home therapy dept to be advised that pt will d/c to home today and will resume home HD at d/c.   Olivia Canter Renal Navigator [phone]

## 2023-05-07 LAB — HEPATITIS B SURFACE ANTIBODY, QUANTITATIVE: Hep B S AB Quant (Post): <3.5 m[IU]/mL — ABNORMAL LOW

## 2023-05-08 LAB — BODY FLUID CULTURE W GRAM STAIN: Gram Stain: NONE SEEN

## 2023-05-09 LAB — CULTURE, BLOOD (ROUTINE X 2)
Culture: NO GROWTH
Special Requests: ADEQUATE

## 2023-05-19 ENCOUNTER — Ambulatory Visit (HOSPITAL_COMMUNITY)
Admission: RE | Admit: 2023-05-19 | Discharge: 2023-05-19 | Disposition: A | Discharge status: Home / Self Care | Payer: Medicare Other | Source: Ambulatory Visit | Attending: Nephrology | Admitting: Nephrology

## 2023-05-19 DIAGNOSIS — R188 Other ascites: Secondary | ICD-10-CM | POA: Insufficient documentation

## 2023-05-19 HISTORY — PX: IR PARACENTESIS: IMG2679

## 2023-05-19 MED ORDER — LIDOCAINE HCL 1 % IJ SOLN
INTRAMUSCULAR | Status: AC
  Filled 2023-05-19: qty 20

## 2023-05-19 MED ORDER — LIDOCAINE HCL 1 % IJ SOLN
20.0000 mL | Freq: Once | INTRAMUSCULAR | Status: AC
  Administered 2023-05-19: 10 mL

## 2023-05-19 NOTE — Procedures (Signed)
 PROCEDURE SUMMARY:  Successful image-guided paracentesis from the left abdomen.  Yielded 4.3 liters of clear, straw-colored fluid.  No immediate complications.  EBL: zero Patient tolerated well.   Please see imaging section of Epic for full dictation.  Lovena Rubinstein PA-C 05/19/2023 2:37 PM

## 2023-05-20 ENCOUNTER — Encounter (HOSPITAL_COMMUNITY): Admission: RE | Payer: Self-pay | Source: Home / Self Care

## 2023-05-20 ENCOUNTER — Ambulatory Visit (HOSPITAL_COMMUNITY): Admission: RE | Admit: 2023-05-20 | Payer: Medicare Other | Source: Home / Self Care | Admitting: Nephrology

## 2023-05-20 SURGERY — A/V FISTULAGRAM
Anesthesia: LOCAL

## 2023-05-26 ENCOUNTER — Ambulatory Visit: Payer: Self-pay | Admitting: Cardiology

## 2023-05-29 ENCOUNTER — Ambulatory Visit: Payer: Medicare Other | Admitting: Cardiology

## 2023-06-02 ENCOUNTER — Ambulatory Visit: Payer: Medicare Other | Attending: Cardiology | Admitting: Cardiology

## 2023-06-02 ENCOUNTER — Encounter: Payer: Self-pay | Admitting: Cardiology

## 2023-06-02 VITALS — BP 130/58 | HR 90 | Resp 16 | Ht 71.0 in | Wt 200.0 lb

## 2023-06-02 DIAGNOSIS — I2510 Atherosclerotic heart disease of native coronary artery without angina pectoris: Secondary | ICD-10-CM | POA: Diagnosis not present

## 2023-06-02 DIAGNOSIS — Z992 Dependence on renal dialysis: Secondary | ICD-10-CM | POA: Insufficient documentation

## 2023-06-02 DIAGNOSIS — I5032 Chronic diastolic (congestive) heart failure: Secondary | ICD-10-CM | POA: Insufficient documentation

## 2023-06-02 DIAGNOSIS — N186 End stage renal disease: Secondary | ICD-10-CM | POA: Diagnosis present

## 2023-06-02 DIAGNOSIS — I4821 Permanent atrial fibrillation: Secondary | ICD-10-CM | POA: Diagnosis not present

## 2023-06-02 NOTE — Patient Instructions (Signed)
 Medication Instructions:  Your physician recommends that you continue on your current medications as directed. Please refer to the Current Medication list given to you today.  *If you need a refill on your cardiac medications before your next appointment, please call your pharmacy*   Lab Work: none If you have labs (blood work) drawn today and your tests are completely normal, you will receive your results only by: MyChart Message (if you have MyChart) OR A paper copy in the mail If you have any lab test that is abnormal or we need to change your treatment, we will call you to review the results.   Testing/Procedures: none   Follow-Up: At [facility], you and your health needs are our priority.  As part of our continuing mission to provide you with exceptional heart care, we have created designated Provider Care Teams.  These Care Teams include your primary Cardiologist (physician) and Advanced Practice Providers (APPs -  Physician Assistants and Nurse Practitioners) who all work together to provide you with the care you need, when you need it.  We recommend signing up for the patient portal called "MyChart".  Sign up information is provided on this After Visit Summary.  MyChart is used to connect with patients for Virtual Visits (Telemedicine).  Patients are able to view lab/test results, encounter notes, upcoming appointments, etc.  Non-urgent messages can be sent to your provider as well.   To learn more about what you can do with MyChart, go to [email or web address].    Your next appointment:   12 month(s)  Provider:   Yates Decamp, MD     Other Instructions

## 2023-06-02 NOTE — Progress Notes (Signed)
 Cardiology Office Note:  .   Date:  06/02/2023  ID:  [name], DOB 1951/01/19, MRN [id number] PCP: Adolph Pollack, FNP  [facility] Providers Cardiologist:  Yates Decamp, MD   History of Present Illness: .   [name] is a 73 y.o. 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  He is also developed cirrhosis of the liver due to fatty liver and has now developed ascites needing peritoneal centesis occasionally.  Discussed the use of AI scribe software for clinical note transcription with the patient, who gave verbal consent to proceed.  History of Present Illness   The patient, with a history of heart disease, renal failure, and diabetes, presents with recurrent fluid accumulation in the abdomen. He reports that this has been an ongoing issue, requiring periodic drainage approximately once a month.   In addition to this, the patient has recently recovered from pneumonia, which required hospitalization. He reports complete recovery and has resumed physical therapy three times a week. He is ambulating with a prosthesis and reports no issues with mobility.  The patient is currently undergoing hemodialysis at home, but is considering transitioning to a center due to caregiver fatigue. He mentions that the home dialysis machine may not be as effective as those at a center, but does not report any specific issues or complications related to the dialysis.  The patient's diabetes is managed by a separate healthcare provider, and he does not report any  recent changes or concerns related to his diabetes management.      Labs   Lab Results  Component Value Date   TRIG 142 07/24/2022   Lab Results  Component Value Date   NA 131 (L) 05/06/2023   K 4.5 05/06/2023   CO2 21 (L) 05/06/2023   GLUCOSE 113 (H) 05/06/2023   BUN 71 (H) 05/06/2023   CREATININE 8.08 (H) 05/06/2023   CALCIUM 8.2 (L) 05/06/2023   GFR 7.03 (LL) 10/11/2022   GFRNONAA 6 (L) 05/06/2023      Latest Ref Rng & Units 05/06/2023    5:57 AM 05/04/2023    4:20 PM 10/11/2022   12:36 PM  BMP  Glucose 70 - 99 mg/dL 409  79  811   BUN 8 - 23 mg/dL 71  49  66   Creatinine 0.61 - 1.24 mg/dL 9.14  7.82  9.56   Sodium 135 - 145 mmol/L 131  131  134   Potassium 3.5 - 5.1 mmol/L 4.5  5.0  4.7   Chloride 98 - 111 mmol/L 96  89  94   CO2 22 - 32 mmol/L 21  27  28    Calcium 8.9 - 10.3 mg/dL 8.2  9.3  8.9       Latest Ref Rng & Units 05/06/2023    5:57 AM 05/04/2023    4:20 PM 10/11/2022   12:36 PM  CBC  WBC 4.0 - 10.5 K/uL 5.9  9.4  4.9   Hemoglobin 13.0 - 17.0 g/dL 8.4  21.3  9.7   Hematocrit 39.0 - 52.0 % 26.1  34.1  30.0   Platelets 150 -  400 K/uL 82  119  75.0    Lab Results  Component Value Date   HGBA1C 5.0 05/17/2022    Lab Results  Component Value Date   TSH 1.546 05/05/2023    External Labs:  Care everywhere labs 05/13/2023:  Total cholesterol 74, triglycerides 04/09/2027, HDL 23, LDL 30.  Sodium 127, potassium 4.2, total protein 5.5, alkaline phosphatase elevated at 210, AST and ALT normal.  Review of Systems  Cardiovascular:  Negative for chest pain, dyspnea on exertion and leg swelling.  Gastrointestinal:  Positive for bloating.   Physical Exam:   VS:  BP (!) 130/58 (BP Location: Left Arm, Patient Position: Sitting, Cuff Size: Normal)   Pulse 90   Resp 16   Ht 5\' 11"  (1.803 m)   Wt 200 lb (90.7 kg) Comment: Patient reported : has 2 prosthetic legs  SpO2 92%   BMI 27.89 kg/m    Wt Readings from Last 3 Encounters:  06/02/23 200 lb (90.7 kg)   05/05/23 198 lb 6.6 oz (90 kg)  03/05/23 200 lb (90.7 kg)    Physical Exam Neck:     Vascular: No carotid bruit or JVD.  Cardiovascular:     Rate and Rhythm: Normal rate and regular rhythm.     Pulses:           Right popliteal pulse not accessible and left popliteal pulse not accessible.     Heart sounds: Normal heart sounds. No murmur heard.    No gallop.  Pulmonary:     Effort: Pulmonary effort is normal.     Breath sounds: Normal breath sounds.  Abdominal:     General: Bowel sounds are normal. There is distension.     Palpations: Abdomen is soft. There is shifting dullness.    Studies Reviewed: .    Echocardiogram 11/08/2022: Normal LV systolic function with visual EF 60-65%. Left ventricle cavity is normal in size. Normal global wall motion. Mild left ventricular hypertrophy. Unable to evaluate diastolic function due to atrial fibrillation. Left atrial cavity is moderately dilated at 46.76 ml/m^2. Aortic valve sclerosis without stenosis. Native mitral valve. Mitral annular calcification. Mild (Grade I) mitral regurgitation. Moderate tricuspid regurgitation. Mild pulmonary hypertension. RVSP measures 39 mmHg. Mild pulmonic regurgitation. IVC is dilated with a respiratory response of >50%. Estimated right atrial pressure .  EKG:    EKG 05/05/2023: Atrial fibrillation with controlled ventricular response at the rate of 68 bpm, incomplete right bundle branch block.  No evidence of ischemia.  Medications and allergies    Allergies  Allergen Reactions   Morphine Other (See Comments)    Other reaction(s): Delusions (intolerance)   Mushroom Extract Complex (Obsolete) Nausea Only     Current Outpatient Medications:    acetaminophen (TYLENOL) 325 MG tablet, Take 2 tablets (650 mg total) by mouth every 4 (four) hours as needed for fever or mild pain., Disp: , Rfl:    albuterol (VENTOLIN HFA) 108 (90 Base) MCG/ACT inhaler, Inhale 2 puffs into the lungs every 6 (six)  hours as needed for shortness of breath or wheezing., Disp: , Rfl:    allopurinol (ZYLOPRIM) 100 MG tablet, Take 2 tablets (200 mg total) by mouth daily., Disp: 30 tablet, Rfl: 0   calcitRIOL (ROCALTROL) 0.5 MCG capsule, Take 0.5 mcg by mouth 3 (three) times a week., Disp: , Rfl:    ketoconazole (NIZORAL) 2 % cream, Apply 1 Application topically 2 (two) times daily as needed for irritation., Disp: , Rfl:    levothyroxine (SYNTHROID) 112  MCG tablet, Take 1 tablet (112 mcg total) by mouth daily before breakfast., Disp: 30 tablet, Rfl: 0   Methoxy PEG-Epoetin Beta (MIRCERA IJ), Inject into the skin., Disp: , Rfl:    modafinil (PROVIGIL) 200 MG tablet, Take 1 tablet (200 mg total) by mouth daily., Disp: 90 tablet, Rfl: 3   OZEMPIC, 0.25 OR 0.5 MG/DOSE, 2 MG/3ML SOPN, Inject 0.25 mg into the skin once a week., Disp: , Rfl:    pantoprazole (PROTONIX) 40 MG tablet, TAKE 1 TABLET BY MOUTH EVERY DAY, Disp: 90 tablet, Rfl: 2   pregabalin (LYRICA) 150 MG capsule, Take 150 mg by mouth 3 (three) times daily., Disp: , Rfl:    propranolol (INDERAL) 40 MG tablet, Take 40 mg by mouth daily., Disp: , Rfl:    rosuvastatin (CRESTOR) 10 MG tablet, Take 1 tablet (10 mg total) by mouth daily., Disp: 30 tablet, Rfl: 0   sevelamer carbonate (RENVELA) 800 MG tablet, Take 800 mg by mouth 3 (three) times daily., Disp: , Rfl:    torsemide (DEMADEX) 20 MG tablet, TAKE 1 TABLET BY MOUTH TWICE A DAY, Disp: 180 tablet, Rfl: 1   ASSESSMENT AND PLAN: .      ICD-10-CM   1. Coronary artery disease involving native coronary artery of native heart without angina pectoris  I25.10     2. Chronic diastolic heart failure (HCC)  G95.62     3. Permanent atrial fibrillation (HCC)  I48.21     4. End-stage renal disease on hemodialysis (HCC)  N18.6    Z99.2       Assessment and Plan    Atrial Fibrillation (Permanent)   The patient has been in permanent atrial fibrillation for an extended period, with initial rhythm control attempts  unsuccessful. He remains asymptomatic. Continue current management and monitor for new symptoms or complications.  Not a candidate for anticoagulation in view of prior life-threatening GI bleed, patient has end-stage renal disease and also cirrhosis of liver all predisposing to increased risk of bleeding complications.  Chronic Kidney Disease on Hemodialysis   He is transitioning from home hemodialysis to a dialysis center due to caregiver fatigue and perceived better machine quality, having been on home hemodialysis for five years. Transition to the dialysis center and coordinate with the center for scheduling and care management.  Hyponatremia   Recent sodium levels were 134 and 131, likely influenced by pneumonia and cirrhosis of the liver with ascites may also be contributing to this.  He has chronic diastolic heart failure however today on physical exam, his lungs are clear, there is no JVD, no dyspnea, PND or orthopnea all suggesting hyponatremia from noncardiac etiology.  Cirrhosis of the Liver   Early cirrhosis, likely due to nonalcoholic steatohepatitis (NASH), presents with fluid accumulation requiring periodic paracentesis. Discuss potential use of spironolactone with a nephrologist due to the need for potassium monitoring. Monitor liver function and fluid accumulation, and continue periodic paracentesis as needed.  He has oliguria, wonder whether we can challenge him with high-dose diuretics or spironolactone.  General Health Maintenance   He is seeking a new primary care physician due to dissatisfaction with current care and requires ongoing management for multiple chronic conditions. Assist in finding a new primary care physician within [facility] and ensure continuity of care for chronic conditions.  Follow-up   Follow-up with a cardiologist in one year, coordinate with a nephrologist for monthly visits, and schedule an appointment with a gastroenterologist for liver management.    Time spent in discussing  both cardiac and noncardiac issues with the patient and his wife at the bedside was 45 minutes.  I reviewed his CT scan of the abdomen, reviewed his hospitalization records, reviewed his external labs, discussed regarding hyponatremia and metabolic issues.  CC: Peri Jefferson, MD (Nephrology)     Signed,  Yates Decamp, MD, [facility] 06/02/2023, 6:36 PM [facility] [address] [place], [place] [address] Phone: [phone]. Fax:  [phone]

## 2023-06-05 ENCOUNTER — Telehealth: Payer: Self-pay | Admitting: Orthopedic Surgery

## 2023-06-09 ENCOUNTER — Ambulatory Visit (INDEPENDENT_AMBULATORY_CARE_PROVIDER_SITE_OTHER): Payer: Medicare Other | Admitting: Orthopedic Surgery

## 2023-06-09 DIAGNOSIS — Z89511 Acquired absence of right leg below knee: Secondary | ICD-10-CM

## 2023-06-09 DIAGNOSIS — S88112A Complete traumatic amputation at level between knee and ankle, left lower leg, initial encounter: Secondary | ICD-10-CM

## 2023-06-09 DIAGNOSIS — S88111A Complete traumatic amputation at level between knee and ankle, right lower leg, initial encounter: Secondary | ICD-10-CM

## 2023-06-09 DIAGNOSIS — Z89512 Acquired absence of left leg below knee: Secondary | ICD-10-CM

## 2023-06-10 ENCOUNTER — Encounter: Payer: Self-pay | Admitting: Orthopedic Surgery

## 2023-06-10 NOTE — Progress Notes (Signed)
 Office Visit Note   Patient: [name]           Date of Birth: 1950/05/21           MRN: [id number] Visit Date: 06/09/2023              Requested by: Delfina Redwood, FNP [address] [place],  [place] [address] PCP: Delfina Redwood, FNP  Chief Complaint  Patient presents with   Left Leg - Follow-up    Hx left BKA, needs new Rx to [facility]      HPI: Patient is a 73 year old gentleman who is seen in follow-up for bilateral transtibial amputations.  Patient has breakdown of the socket that is a socket list socket on the left with torn liner and pain with subsiding into the socket.  Assessment & Plan: Visit Diagnoses:  1. Below-knee amputation of left lower extremity, initial encounter (HCC)   2. Below-knee amputation of right lower extremity, initial encounter [facility])     Plan: Patient is provided a prescription for a new socket with sock on the left To renew supplies.  Follow-Up Instructions: Return if symptoms worsen or fail to improve.   Ortho Exam  Patient is alert, oriented, no adenopathy, well-dressed, normal affect, normal respiratory effort. Examination patient has no open ulcers on either lower limb.  Patient is subsiding into the socket the socket on the left with a torn liner.  Patient is an existing bilateral transtibial  amputee.  Patient's current comorbidities are not expected to impact the ability to function with the prescribed prosthesis. Patient verbally communicates a strong desire to use a prosthesis. Patient currently requires mobility aids to ambulate without a prosthesis.  Expects not to use mobility aids with a new prosthesis.  Patient is a K2 level ambulator that will use a prosthesis to walk around their home and the community over low level environmental barriers.      Imaging: No results found. No images are attached to the encounter.  Labs: Lab Results  Component Value Date   HGBA1C 5.0 05/17/2022   HGBA1C 5.5 05/04/2021    HGBA1C 6.3 (H) 02/02/2021   ESRSEDRATE 34 (H) 01/24/2020   ESRSEDRATE 41 (H) 01/10/2020   ESRSEDRATE 55 (H) 12/27/2019   CRP 8.8 (H) 01/24/2020   CRP 35.9 (H) 01/10/2020   CRP 77.9 (H) 12/27/2019   REPTSTATUS 05/08/2023 FINAL 05/05/2023   GRAMSTAIN NO WBC SEEN NO ORGANISMS SEEN  05/05/2023   CULT  05/05/2023    NO GROWTH 3 DAYS Performed at [facility], [address], [place], [place] [address]    LABORGA PROTEUS MIRABILIS 05/17/2022   LABORGA ENTEROCOCCUS FAECALIS 05/17/2022   LABORGA STAPHYLOCOCCUS AUREUS 05/17/2022     Lab Results  Component Value Date   ALBUMIN 2.6 (L) 05/06/2023   ALBUMIN 4.5 05/04/2023   ALBUMIN 4.2 10/11/2022   PREALBUMIN 32.7 05/09/2021   PREALBUMIN 14.8 (L) 12/14/2018    Lab Results  Component Value Date   MG 1.7 05/05/2023   MG 2.0 07/25/2022   MG 1.6 (L) 07/23/2022   Lab Results  Component Value Date   VD25OH 42.0 11/19/2021   VD25OH 16.13 (L) 05/09/2021    Lab Results  Component Value Date   PREALBUMIN 32.7 05/09/2021   PREALBUMIN 14.8 (L) 12/14/2018      Latest Ref Rng & Units 05/06/2023    5:57 AM 05/04/2023    4:20 PM 10/11/2022   12:36 PM  CBC EXTENDED  WBC 4.0 -  10.5 K/uL 5.9  9.4  4.9   RBC 4.22 - 5.81 MIL/uL 2.96  3.87  3.33   Hemoglobin 13.0 - 17.0 g/dL 8.4  40.9  9.7   HCT 81.1 - 52.0 % 26.1  34.1  30.0   Platelets 150 - 400 K/uL 82  119  75.0   NEUT# 1.7 - 7.7 K/uL  8.3  4.1   Lymph# 0.7 - 4.0 K/uL  0.3  0.3      There is no height or weight on file to calculate BMI.  Orders:  No orders of the defined types were placed in this encounter.  No orders of the defined types were placed in this encounter.    Procedures: No procedures performed  Clinical Data: No additional findings.  ROS:  All other systems negative, except as noted in the HPI. Review of Systems  Objective: Vital Signs: There were no vitals taken for this visit.  Specialty Comments:  No specialty comments available.  PMFS  History: Patient Active Problem List   Diagnosis Date Noted   Acute respiratory failure with hypoxia (HCC) 05/05/2023   Ascites 05/05/2023   Hyponatremia 05/05/2023   Right below-knee amputee (HCC) 07/29/2022   Sepsis due to pneumonia (HCC) 07/23/2022   Foot infection 07/22/2022   Acute respiratory failure (HCC) 07/22/2022   Anemia due to chronic blood loss 06/23/2022   Heme positive stool 06/23/2022   Benign neoplasm of cecum 06/23/2022   Benign neoplasm of transverse colon 06/23/2022   Benign neoplasm of descending colon 06/23/2022   Abscess of right foot 05/17/2022   Contraindication to anticoagulation therapy GU Bleed 03/13/2022   Permanent atrial fibrillation (HCC) 08/18/2021   Anemia of chronic renal failure 08/18/2021   Thrombocytopenia (HCC) 08/18/2021   Acute metabolic encephalopathy 08/18/2021   Action induced myoclonus 08/18/2021   Left below-knee amputee (HCC) 05/11/2021   Anemia of chronic disease 09/13/2020   COVID-19 virus infection 09/13/2020   Pressure injury of skin 06/29/2020   Gross hematuria 06/16/2020   Typical atrial flutter (HCC) 03/24/2020   Bilateral pleural effusion 02/24/2020   Healthcare maintenance 01/28/2020   Shortness of breath 01/28/2020   History of partial ray amputation of fourth toe of right foot (HCC) 09/08/2019   Chronic cough 06/25/2019   Cutaneous abscess of right foot    Subacute osteomyelitis, right ankle and foot (HCC) 12/14/2018   AKI (acute kidney injury) (HCC) 12/14/2018   ESRD on dialysis (HCC) 12/14/2018   S/P CABG (coronary artery bypass graft) 12/11/2018   Gait abnormality 08/24/2018   Diabetic neuropathy (HCC) 02/06/2018   Cervical myelopathy (HCC) 02/06/2018   Onychomycosis 10/30/2017   Other spondylosis with radiculopathy, cervical region 01/27/2017   Midfoot ulcer, right, limited to breakdown of skin (HCC) 11/15/2016   Lateral epicondylitis, left elbow 08/12/2016   Prostate cancer (HCC) 06/19/2016   Cellulitis of  right foot 12/24/2015   Gout 09/14/2012   Essential hypertension 09/14/2012   Hypothyroidism 09/14/2012   CAD (coronary artery disease) 09/14/2012   Insulin dependent type 2 diabetes mellitus (HCC) 09/14/2012   Hyperlipidemia associated with type 2 diabetes mellitus (HCC)    Neuropathy (HCC)    Past Medical History:  Diagnosis Date   Acute osteomyelitis of metatarsal bone of left foot (HCC)    Ambulates with cane    straight cane   Anemia    Cervical myelopathy (HCC) 02/06/2018   Chronic kidney disease    dailysis M [facility] F- home   Community acquired pneumonia of left lower lobe  of lung 08/18/2021   Complication of anesthesia    Coronary artery disease    Dehiscence of amputation stump of left lower extremity (HCC)    Diabetes mellitus without complication (HCC)    type2   Diabetic foot ulcer (HCC)    Diabetic neuropathy (HCC) 02/06/2018   Dysrhythmia    Gait abnormality 08/24/2018   GERD (gastroesophageal reflux disease)     01/06/20- not current   Gout    History of blood transfusion    History of blood transfusion    History of kidney stones    passed stones   Hypercholesteremia    Hypertension    Hypothyroidism    Neuromuscular disorder (HCC)    neuropathy left leg and bilateral feet   Neuropathy    Partial nontraumatic amputation of foot, 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   PONV (postoperative nausea and vomiting)    Prostate cancer (HCC)    PVD (peripheral vascular disease) (HCC)    with amputations    Family History  Problem Relation Age of Onset   Diabetes Mellitus II Mother    Kidney disease Mother    Diabetes Mellitus II Father    CAD Father    Cancer Father        prostate   Kidney disease Father    Diabetes Mellitus II Brother    Kidney disease Brother    Diabetes Mellitus II Brother    Stomach cancer Brother 41   Kidney disease Brother    Colon cancer Neg Hx    Colon polyps Neg Hx    Esophageal cancer Neg Hx    Rectal cancer Neg Hx    Pancreatic cancer Neg  Hx     Past Surgical History:  Procedure Laterality Date   A-FLUTTER ABLATION N/A 04/06/2020   Procedure: A-FLUTTER ABLATION;  Surgeon: Marinus Maw, MD;  Location: [facility] INVASIVE CV LAB;  Service: Cardiovascular;  Laterality: N/A;   A/V FISTULAGRAM N/A 03/05/2023   Procedure: A/V Fistulagram;  Surgeon: Daria Pastures, MD;  Location: [facility] INVASIVE CV LAB;  Service: Vascular;  Laterality: N/A;   AMPUTATION Left 12/25/2013   Procedure: AMPUTATION RAY LEFT 5TH RAY;  Surgeon: Nadara Mustard, MD;  Location: [facility] ORS;  Service: Orthopedics;  Laterality: Left;   AMPUTATION Right 12/15/2018   Procedure: AMPUTATION OF 4TH AND 5TH TOES RIGHT FOOT;  Surgeon: Nadara Mustard, MD;  Location: [facility] OR;  Service: Orthopedics;  Laterality: Right;   AMPUTATION Left 02/02/2021   Procedure: LEFT FOOT 4TH RAY AMPUTATION;  Surgeon: Nadara Mustard, MD;  Location: [facility] OR;  Service: Orthopedics;  Laterality: Left;   AMPUTATION Left 05/09/2021   Procedure: LEFT BELOW KNEE AMPUTATION;  Surgeon: Nadara Mustard, MD;  Location: [facility] OR;  Service: Orthopedics;  Laterality: Left;   AMPUTATION Right 07/26/2022   Procedure: RIGHT BELOW KNEE AMPUTATION;  Surgeon: Nadara Mustard, MD;  Location: [facility] OR;  Service: Orthopedics;  Laterality: Right;   APPLICATION OF WOUND VAC Right 12/15/2018   Procedure: APPLICATION OF WOUND VAC;  Surgeon: Nadara Mustard, MD;  Location: [facility] OR;  Service: Orthopedics;  Laterality: Right;   AV FISTULA PLACEMENT Left 03/25/2019   Procedure: LEFT ARM ARTERIOVENOUS (AV) FISTULA CREATION;  Surgeon: Nada Libman, MD;  Location: [facility] OR;  Service: Vascular;  Laterality: Left;   BACK SURGERY     BASCILIC VEIN TRANSPOSITION Left 05/20/2019   Procedure: SECOND STAGE LEFT BASCILIC VEIN TRANSPOSITION;  Surgeon: Nada Libman, MD;  Location: [facility] OR;  Service: Vascular;  Laterality: Left;   BIOPSY  06/22/2022   Procedure: BIOPSY;  Surgeon: Meridee Score Netty Starring., MD;  Location: [facility] ENDOSCOPY;  Service: Gastroenterology;;    CARDIAC CATHETERIZATION  02/17/2014   CHOLECYSTECTOMY     COLONOSCOPY  2011   in [place], Normal   COLONOSCOPY N/A 06/23/2022   Procedure: COLONOSCOPY;  Surgeon: Sherrilyn Rist, MD;  Location: [facility] ENDOSCOPY;  Service: Gastroenterology;  Laterality: N/A;   CORONARY ARTERY BYPASS GRAFT  2008   CYSTOSCOPY N/A 06/29/2020   Procedure: CYSTOSCOPY WITH CLOT EVACUATION AND  FULGERATION;  Surgeon: Marcine Matar, MD;  Location: [facility] OR;  Service: Urology;  Laterality: N/A;   CYSTOSCOPY WITH FULGERATION Bilateral 06/17/2020   Procedure: CYSTOSCOPY,BILATERAL RETROGRADE, CLOT EVACUATION WITH FULGERATION OF THE BLADDER;  Surgeon: Jannifer Hick, MD;  Location: [facility] OR;  Service: Urology;  Laterality: Bilateral;   CYSTOSCOPY WITH FULGERATION N/A 06/28/2020   Procedure: CYSTOSCOPY WITH CLOT EVACUATION AND FULGERATION OF BLEEDERS;  Surgeon: Marcine Matar, MD;  Location: [facility] OR;  Service: Urology;  Laterality: N/A;  1 HR   ENTEROSCOPY N/A 06/22/2022   Procedure: ENTEROSCOPY;  Surgeon: Meridee Score Netty Starring., MD;  Location: [facility] ENDOSCOPY;  Service: Gastroenterology;  Laterality: N/A;   HEMOSTASIS CLIP PLACEMENT  06/22/2022   Procedure: HEMOSTASIS CLIP PLACEMENT;  Surgeon: Lemar Lofty., MD;  Location: [facility] ENDOSCOPY;  Service: Gastroenterology;;   HOT HEMOSTASIS N/A 06/22/2022   Procedure: HOT HEMOSTASIS (ARGON PLASMA COAGULATION/BICAP);  Surgeon: Lemar Lofty., MD;  Location: [facility] ENDOSCOPY;  Service: Gastroenterology;  Laterality: N/A;   I & D EXTREMITY Right 12/15/2018   Procedure: DEBRIDEMENT RIGHT FOOT;  Surgeon: Nadara Mustard, MD;  Location: [facility] OR;  Service: Orthopedics;  Laterality: Right;   I & D EXTREMITY Right 08/27/2019   Procedure: PARTIAL CUBOID EXCISION RIGHT FOOT;  Surgeon: Nadara Mustard, MD;  Location: [facility] OR;  Service: Orthopedics;  Laterality: Right;   I & D EXTREMITY Right 01/07/2020   Procedure: RIGHT FOOT EXCISION INFECTED BONE;  Surgeon: Nadara Mustard, MD;  Location: [facility]  OR;  Service: Orthopedics;  Laterality: Right;   I & D EXTREMITY Right 05/17/2022   Procedure: IRRIGATION AND DEBRIDEMENT RIGHT FOOT ABSCESS;  Surgeon: Nadara Mustard, MD;  Location: [facility] OR;  Service: Orthopedics;  Laterality: Right;   IR PARACENTESIS  10/11/2022   IR PARACENTESIS  12/24/2022   IR PARACENTESIS  02/06/2023   IR PARACENTESIS  05/05/2023   IR PARACENTESIS  05/19/2023   NECK SURGERY     novemver 2019   PERIPHERAL VASCULAR BALLOON ANGIOPLASTY Left 03/05/2023   Procedure: PERIPHERAL VASCULAR BALLOON ANGIOPLASTY;  Surgeon: Daria Pastures, MD;  Location: [facility] INVASIVE CV LAB;  Service: Vascular;  Laterality: Left;  AVF   POLYPECTOMY  06/22/2022   Procedure: POLYPECTOMY;  Surgeon: Mansouraty, Netty Starring., MD;  Location: [facility] ENDOSCOPY;  Service: Gastroenterology;;   POLYPECTOMY  06/23/2022   Procedure: POLYPECTOMY;  Surgeon: Sherrilyn Rist, MD;  Location: [facility] ENDOSCOPY;  Service: Gastroenterology;;   STUMP REVISION Left 06/27/2021   Procedure: REVISION LEFT BELOW KNEE AMPUTATION;  Surgeon: Nadara Mustard, MD;  Location: [facility] OR;  Service: Orthopedics;  Laterality: Left;   SUBMUCOSAL LIFTING INJECTION  06/22/2022   Procedure: SUBMUCOSAL LIFTING INJECTION;  Surgeon: Meridee Score Netty Starring., MD;  Location: [facility] ENDOSCOPY;  Service: Gastroenterology;;   SUBMUCOSAL TATTOO INJECTION  06/22/2022   Procedure: SUBMUCOSAL TATTOO INJECTION;  Surgeon: Lemar Lofty., MD;  Location: [facility] ENDOSCOPY;  Service: Gastroenterology;;   TRANSURETHRAL RESECTION  OF PROSTATE N/A 06/28/2020   Procedure: TRANSURETHRAL RESECTION OF THE PROSTATE (TURP);  Surgeon: Marcine Matar, MD;  Location: [facility] OR;  Service: Urology;  Laterality: N/A;   WISDOM TOOTH EXTRACTION     Social History   Occupational History   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  Tobacco Use   Smoking status: Former    Types: Cigars    Start date: 04/08/1973    Quit date: 12/24/1988    Years since quitting: 34.4   Smokeless tobacco: Never    Tobacco comments:    Cigars and Pipe   Vaping Use   Vaping status: Never Used  Substance and Sexual Activity   Alcohol use: Never   Drug use: Never   Sexual activity: Yes    Partners: Female

## 2023-06-16 ENCOUNTER — Other Ambulatory Visit (HOSPITAL_COMMUNITY): Payer: Self-pay | Admitting: Nephrology

## 2023-06-16 ENCOUNTER — Other Ambulatory Visit: Payer: Self-pay

## 2023-06-16 DIAGNOSIS — R188 Other ascites: Secondary | ICD-10-CM

## 2023-06-16 MED ORDER — TORSEMIDE 20 MG PO TABS: 20.0000 mg | ORAL_TABLET | Freq: Two times a day (BID) | ORAL | 3 refills | Status: DC

## 2023-06-20 ENCOUNTER — Ambulatory Visit (HOSPITAL_COMMUNITY)
Admission: RE | Admit: 2023-06-20 | Discharge: 2023-06-20 | Disposition: A | Discharge status: Home / Self Care | Source: Ambulatory Visit | Attending: Nephrology | Admitting: Nephrology

## 2023-06-20 DIAGNOSIS — K7469 Other cirrhosis of liver: Secondary | ICD-10-CM | POA: Diagnosis not present

## 2023-06-20 DIAGNOSIS — R188 Other ascites: Secondary | ICD-10-CM | POA: Insufficient documentation

## 2023-06-20 MED ORDER — LIDOCAINE HCL 1 % IJ SOLN
INTRAMUSCULAR | Status: AC
  Filled 2023-06-20: qty 20

## 2023-06-20 NOTE — Procedures (Signed)
 PROCEDURE SUMMARY:  Successful image-guided therapeutic paracentesis from the left lower abdomen.  Yielded 5 liters of yellow fluid.  No immediate complications.  EBL: zero Patient tolerated well.    Please see imaging section of Epic for full dictation.  Ivann Trimarco NP 06/20/2023 3:47 PM

## 2023-07-11 ENCOUNTER — Ambulatory Visit (HOSPITAL_COMMUNITY)
Admission: RE | Admit: 2023-07-11 | Discharge: 2023-07-11 | Disposition: A | Discharge status: Home / Self Care | Source: Ambulatory Visit | Attending: Nephrology | Admitting: Nephrology

## 2023-07-11 DIAGNOSIS — R188 Other ascites: Secondary | ICD-10-CM | POA: Diagnosis present

## 2023-07-11 HISTORY — PX: IR PARACENTESIS: IMG2679

## 2023-07-11 MED ORDER — LIDOCAINE HCL 1 % IJ SOLN
INTRAMUSCULAR | Status: AC
  Filled 2023-07-11: qty 20

## 2023-07-11 NOTE — Procedures (Signed)
 PROCEDURE SUMMARY:  Successful image-guided therapeutic paracentesis from the left lower abdomen.  Yielded 5.7 liters of yellow fluid.  No immediate complications.  EBL: zero Patient tolerated well.    Please see imaging section of Epic for full dictation.  Orlan Aversa NP 07/11/2023 3:45 PM

## 2023-07-23 ENCOUNTER — Other Ambulatory Visit (HOSPITAL_COMMUNITY): Payer: Self-pay | Admitting: Nephrology

## 2023-07-23 DIAGNOSIS — R188 Other ascites: Secondary | ICD-10-CM

## 2023-07-28 ENCOUNTER — Other Ambulatory Visit (HOSPITAL_COMMUNITY): Payer: Self-pay | Admitting: Nephrology

## 2023-07-28 DIAGNOSIS — R188 Other ascites: Secondary | ICD-10-CM

## 2023-07-29 ENCOUNTER — Ambulatory Visit: Payer: Medicare Other | Admitting: Psychology

## 2023-07-30 ENCOUNTER — Ambulatory Visit (HOSPITAL_COMMUNITY)
Admission: RE | Admit: 2023-07-30 | Discharge: 2023-07-30 | Disposition: A | Discharge status: Home / Self Care | Source: Ambulatory Visit | Attending: Nephrology | Admitting: Nephrology

## 2023-07-30 DIAGNOSIS — R188 Other ascites: Secondary | ICD-10-CM | POA: Insufficient documentation

## 2023-07-30 DIAGNOSIS — N186 End stage renal disease: Secondary | ICD-10-CM | POA: Insufficient documentation

## 2023-07-30 HISTORY — PX: IR PARACENTESIS: IMG2679

## 2023-07-30 MED ORDER — LIDOCAINE HCL 1 % IJ SOLN
INTRAMUSCULAR | Status: AC
  Filled 2023-07-30: qty 20

## 2023-07-30 MED ORDER — LIDOCAINE HCL 1 % IJ SOLN
10.0000 mL | Freq: Once | INTRAMUSCULAR | Status: AC
  Administered 2023-07-30: 10 mL via INTRADERMAL

## 2023-07-30 NOTE — Procedures (Signed)
 PROCEDURE SUMMARY:  Successful US  guided paracentesis from the left lower abdomen Yielded 6.2 Liters of clear yellow fluid.  No immediate complications.  Pt tolerated well.   No labs requested   EBL < 5mL  [place] Souter N Keeon Zurn PA-C 07/30/2023 2:32 PM

## 2023-07-31 ENCOUNTER — Ambulatory Visit: Payer: Medicare Other | Admitting: Psychology

## 2023-08-08 ENCOUNTER — Encounter
Payer: Medicare Other | Attending: Physical Medicine and Rehabilitation | Admitting: Physical Medicine and Rehabilitation

## 2023-08-20 ENCOUNTER — Ambulatory Visit (HOSPITAL_COMMUNITY)
Admission: RE | Admit: 2023-08-20 | Discharge: 2023-08-20 | Disposition: A | Discharge status: Home / Self Care | Source: Ambulatory Visit | Attending: Nephrology | Admitting: Nephrology

## 2023-08-20 DIAGNOSIS — R188 Other ascites: Secondary | ICD-10-CM | POA: Diagnosis not present

## 2023-08-20 DIAGNOSIS — N186 End stage renal disease: Secondary | ICD-10-CM | POA: Insufficient documentation

## 2023-08-20 HISTORY — PX: IR PARACENTESIS: IMG2679

## 2023-08-20 MED ORDER — LIDOCAINE HCL 1 % IJ SOLN
INTRAMUSCULAR | Status: AC
  Filled 2023-08-20: qty 20

## 2023-08-20 MED ORDER — LIDOCAINE HCL 1 % IJ SOLN
20.0000 mL | Freq: Once | INTRAMUSCULAR | Status: AC
  Administered 2023-08-20: 10 mL via INTRADERMAL

## 2023-08-20 NOTE — Procedures (Signed)
 PROCEDURE SUMMARY:  Successful image-guided paracentesis from the left lower abdomen.  Yielded 5.9 liters of clear, yellow fluid.  No immediate complications.  EBL = trace. Patient tolerated well.   Please see imaging section of Epic for full dictation.  Pasty Bongo PA-C 08/20/2023 1:16 PM

## 2023-09-05 ENCOUNTER — Other Ambulatory Visit (HOSPITAL_COMMUNITY): Payer: Self-pay | Admitting: Nephrology

## 2023-09-05 DIAGNOSIS — R188 Other ascites: Secondary | ICD-10-CM

## 2023-09-08 ENCOUNTER — Ambulatory Visit (HOSPITAL_COMMUNITY)
Admission: RE | Admit: 2023-09-08 | Discharge: 2023-09-08 | Disposition: A | Discharge status: Home / Self Care | Source: Ambulatory Visit | Attending: Nephrology | Admitting: Nephrology

## 2023-09-08 DIAGNOSIS — R188 Other ascites: Secondary | ICD-10-CM | POA: Diagnosis present

## 2023-09-08 DIAGNOSIS — K7460 Unspecified cirrhosis of liver: Secondary | ICD-10-CM | POA: Insufficient documentation

## 2023-09-08 DIAGNOSIS — N186 End stage renal disease: Secondary | ICD-10-CM | POA: Insufficient documentation

## 2023-09-08 MED ORDER — LIDOCAINE HCL 1 % IJ SOLN
INTRAMUSCULAR | Status: AC
Start: 2023-09-08 — End: ?
  Filled 2023-09-08: qty 20

## 2023-09-08 NOTE — Procedures (Signed)
 PROCEDURE SUMMARY:  Successful ultrasound guided therapeutic paracentesis from the left mid to lower quadrant.  Yielded 5 liters of amber fluid.  No immediate complications.  The patient tolerated the procedure well.     EBL none     Wash Hack

## 2023-09-09 ENCOUNTER — Other Ambulatory Visit (HOSPITAL_COMMUNITY): Payer: Self-pay | Admitting: Nephrology

## 2023-09-09 DIAGNOSIS — R188 Other ascites: Secondary | ICD-10-CM

## 2023-09-11 ENCOUNTER — Emergency Department (HOSPITAL_BASED_OUTPATIENT_CLINIC_OR_DEPARTMENT_OTHER)
Admission: EM | Admit: 2023-09-11 | Discharge: 2023-09-12 | Disposition: A | Discharge status: Home / Self Care | Attending: Emergency Medicine | Admitting: Emergency Medicine

## 2023-09-11 ENCOUNTER — Other Ambulatory Visit (HOSPITAL_COMMUNITY)

## 2023-09-11 ENCOUNTER — Encounter (HOSPITAL_COMMUNITY): Payer: Self-pay

## 2023-09-11 ENCOUNTER — Encounter (HOSPITAL_BASED_OUTPATIENT_CLINIC_OR_DEPARTMENT_OTHER): Payer: Self-pay

## 2023-09-11 ENCOUNTER — Emergency Department (HOSPITAL_BASED_OUTPATIENT_CLINIC_OR_DEPARTMENT_OTHER)

## 2023-09-11 DIAGNOSIS — E1122 Type 2 diabetes mellitus with diabetic chronic kidney disease: Secondary | ICD-10-CM | POA: Diagnosis not present

## 2023-09-11 DIAGNOSIS — I2510 Atherosclerotic heart disease of native coronary artery without angina pectoris: Secondary | ICD-10-CM | POA: Diagnosis not present

## 2023-09-11 DIAGNOSIS — N5089 Other specified disorders of the male genital organs: Secondary | ICD-10-CM | POA: Diagnosis present

## 2023-09-11 DIAGNOSIS — Z992 Dependence on renal dialysis: Secondary | ICD-10-CM | POA: Insufficient documentation

## 2023-09-11 DIAGNOSIS — Z951 Presence of aortocoronary bypass graft: Secondary | ICD-10-CM | POA: Diagnosis not present

## 2023-09-11 DIAGNOSIS — N186 End stage renal disease: Secondary | ICD-10-CM | POA: Diagnosis not present

## 2023-09-11 LAB — COMPREHENSIVE METABOLIC PANEL WITH GFR
ALT: 8 U/L (ref 0–44)
AST: 23 U/L (ref 15–41)
Albumin: 3.3 g/dL — ABNORMAL LOW (ref 3.5–5.0)
Alkaline Phosphatase: 182 U/L — ABNORMAL HIGH (ref 38–126)
Anion gap: 15 (ref 5–15)
BUN: 29 mg/dL — ABNORMAL HIGH (ref 8–23)
CO2: 27 mmol/L (ref 22–32)
Calcium: 8.6 mg/dL — ABNORMAL LOW (ref 8.9–10.3)
Chloride: 98 mmol/L (ref 98–111)
Creatinine, Ser: 4.38 mg/dL — ABNORMAL HIGH (ref 0.61–1.24)
GFR, Estimated: 13 mL/min — ABNORMAL LOW (ref >60)
Glucose, Bld: 143 mg/dL — ABNORMAL HIGH (ref 70–99)
Potassium: 4.3 mmol/L (ref 3.5–5.1)
Sodium: 140 mmol/L (ref 135–145)
Total Bilirubin: 0.4 mg/dL (ref 0.0–1.2)
Total Protein: 5.4 g/dL — ABNORMAL LOW (ref 6.5–8.1)

## 2023-09-11 LAB — CBC WITH DIFFERENTIAL/PLATELET
Abs Immature Granulocytes: 0.05 10*3/uL (ref 0.00–0.07)
Basophils Absolute: 0.1 10*3/uL (ref 0.0–0.1)
Basophils Relative: 1 %
Eosinophils Absolute: 0.2 10*3/uL (ref 0.0–0.5)
Eosinophils Relative: 4 %
HCT: 35.9 % — ABNORMAL LOW (ref 39.0–52.0)
Hemoglobin: 11 g/dL — ABNORMAL LOW (ref 13.0–17.0)
Immature Granulocytes: 1 %
Lymphocytes Relative: 7 %
Lymphs Abs: 0.4 10*3/uL — ABNORMAL LOW (ref 0.7–4.0)
MCH: 26.2 pg (ref 26.0–34.0)
MCHC: 30.6 g/dL (ref 30.0–36.0)
MCV: 85.5 fL (ref 80.0–100.0)
Monocytes Absolute: 0.5 10*3/uL (ref 0.1–1.0)
Monocytes Relative: 10 %
Neutro Abs: 3.9 10*3/uL (ref 1.7–7.7)
Neutrophils Relative %: 77 %
Platelets: 112 10*3/uL — ABNORMAL LOW (ref 150–400)
RBC: 4.2 MIL/uL — ABNORMAL LOW (ref 4.22–5.81)
RDW: 16.8 % — ABNORMAL HIGH (ref 11.5–15.5)
WBC: 5.1 10*3/uL (ref 4.0–10.5)
nRBC: 0 % (ref 0.0–0.2)

## 2023-09-11 NOTE — ED Triage Notes (Signed)
 Pt c/o testicular swelling onset "a couple of days ago." Wife advises he fell yesterday, concern for fatigue/ weakness.   2 prosthetic legs, advises that "prosthetics seem to be cutting into the stumps"  Pt also advises that he ha sore ears.   Dialysis T,Th,Sat, full course no issue today.

## 2023-09-12 DIAGNOSIS — N5089 Other specified disorders of the male genital organs: Secondary | ICD-10-CM | POA: Diagnosis not present

## 2023-09-12 MED ORDER — DOXYCYCLINE HYCLATE 100 MG PO CAPS: 100.0000 mg | ORAL_CAPSULE | Freq: Two times a day (BID) | ORAL | 0 refills | Status: DC

## 2023-09-12 MED ORDER — DOXYCYCLINE HYCLATE 100 MG PO TABS
100.0000 mg | ORAL_TABLET | Freq: Once | ORAL | Status: AC
  Administered 2023-09-12: 100 mg via ORAL
  Filled 2023-09-12: qty 1

## 2023-09-12 NOTE — Discharge Instructions (Signed)
 Begin taking doxycycline  as prescribed.  Follow-up with your primary doctor on Monday as scheduled, and return to the ER if symptoms significantly worsen or change.

## 2023-09-12 NOTE — ED Provider Notes (Signed)
 [place] EMERGENCY DEPARTMENT AT [facility] Provider Note   CSN: [id number] Arrival date & time: 09/11/23  2003     History  No chief complaint on file.   [name] is a 73 y.o. male.  Patient is a 73 year old male with extensive past medical history including diabetes, end-stage renal disease on hemodialysis, coronary artery disease with prior CABG, peripheral vascular disease with bilateral below the knee amputations, and recurrent ascites requiring frequent paracentesis.  Patient presenting today for evaluation of testicular swelling.  He noticed this yesterday in the absence of any injury or trauma.  He also describes some discomfort and swelling extending into his thighs.  No fevers or chills.  He was last dialyzed today and underwent a normal session.  Reports paracentesis performed this past Monday.       Home Medications Prior to Admission medications   Medication Sig Start Date End Date Taking? Authorizing Provider  acetaminophen  (TYLENOL ) 325 MG tablet Take 2 tablets (650 mg total) by mouth every 4 (four) hours as needed for fever or mild pain. 08/08/22   Angiulli, Everlyn Hockey, PA-C  albuterol  (VENTOLIN  HFA) 108 (90 Base) MCG/ACT inhaler Inhale 2 puffs into the lungs every 6 (six) hours as needed for shortness of breath or wheezing. 05/13/23 11/09/23  [provider]  allopurinol  (ZYLOPRIM ) 100 MG tablet Take 2 tablets (200 mg total) by mouth daily. 08/08/22   Angiulli, Everlyn Hockey, PA-C  calcitRIOL  (ROCALTROL ) 0.5 MCG capsule Take 0.5 mcg by mouth 3 (three) times a week. 02/17/23   [provider]  ketoconazole  (NIZORAL ) 2 % cream Apply 1 Application topically 2 (two) times daily as needed for irritation. 03/21/23   [provider]  levothyroxine  (SYNTHROID ) 112 MCG tablet Take 1 tablet (112 mcg total) by mouth daily before breakfast. 05/22/21   Angiulli, Everlyn Hockey, PA-C  Methoxy PEG-Epoetin Beta (MIRCERA IJ) Inject into the skin. 08/20/22   [provider]  modafinil  (PROVIGIL ) 200 MG tablet Take 1 tablet (200 mg total) by mouth daily. 02/21/23   Raulkar, Keven Pel, MD  OZEMPIC , 0.25 OR 0.5 MG/DOSE, 2 MG/3ML SOPN Inject 0.25 mg into the skin once a week. 09/19/21   [provider]  pantoprazole  (PROTONIX ) 40 MG tablet TAKE 1 TABLET BY MOUTH EVERY DAY 04/28/23   Tobin Forts, MD  pregabalin  (LYRICA ) 150 MG capsule Take 150 mg by mouth 3 (three) times daily.    [provider]  propranolol  (INDERAL ) 40 MG tablet Take 40 mg by mouth daily. 03/24/23   [provider]  rosuvastatin  (CRESTOR ) 10 MG tablet Take 1 tablet (10 mg total) by mouth daily. 08/08/22   Angiulli, Everlyn Hockey, PA-C  sevelamer  carbonate (RENVELA ) 800 MG tablet Take 800 mg by mouth 3 (three) times daily. 01/17/23   [provider]  torsemide  (DEMADEX ) 20 MG tablet Take 1 tablet (20 mg total) by mouth 2 (two) times daily. 06/16/23   Knox Perl, MD      Allergies    Morphine  and Mushroom extract complex (obsolete)    Review of Systems   Review of Systems  All other systems reviewed and are negative.   Physical Exam Updated Vital Signs BP 127/74   Pulse 61   Temp 98.9 F (37.2 C)   Resp 18   SpO2 95%  Physical Exam Vitals and nursing note reviewed.  Constitutional:      General: He is not in acute distress.    Appearance: He is well-developed. He is  not diaphoretic.  HENT:     Head: Normocephalic and atraumatic.  Cardiovascular:     Rate and Rhythm: Normal rate and regular rhythm.     Heart sounds: No murmur heard.    No friction rub.  Pulmonary:     Effort: Pulmonary effort is normal. No respiratory distress.     Breath sounds: Normal breath sounds. No wheezing or rales.  Abdominal:     General: Bowel sounds are normal. There is no distension.     Palpations: Abdomen is soft.     Tenderness: There is no abdominal tenderness.  Genitourinary:    Comments: There is some swelling/edema of the scrotum with mild erythema  present.  There is no obvious abscess or lesion. Musculoskeletal:        General: Normal range of motion.     Cervical back: Normal range of motion and neck supple.     Comments: There is some edema noted in the distal thigh just proximal to the prostheses.  Skin:    General: Skin is warm and dry.  Neurological:     Mental Status: He is alert and oriented to person, place, and time.     Coordination: Coordination normal.     ED Results / Procedures / Treatments   Labs (all labs ordered are listed, but only abnormal results are displayed) Labs Reviewed  COMPREHENSIVE METABOLIC PANEL WITH GFR - Abnormal; Notable for the following components:      Result Value   Glucose, Bld 143 (*)    BUN 29 (*)    Creatinine, Ser 4.38 (*)    Calcium  8.6 (*)    Total Protein 5.4 (*)    Albumin  3.3 (*)    Alkaline Phosphatase 182 (*)    GFR, Estimated 13 (*)    All other components within normal limits  CBC WITH DIFFERENTIAL/PLATELET - Abnormal; Notable for the following components:   RBC 4.20 (*)    Hemoglobin 11.0 (*)    HCT 35.9 (*)    RDW 16.8 (*)    Platelets 112 (*)    Lymphs Abs 0.4 (*)    All other components within normal limits    EKG None  Radiology US  SCROTUM W/DOPPLER Result Date: 09/11/2023 CLINICAL DATA:  Testicular pain. EXAM: SCROTAL ULTRASOUND DOPPLER ULTRASOUND OF THE TESTICLES TECHNIQUE: Complete ultrasound examination of the testicles, epididymis, and other scrotal structures was performed. Color and spectral Doppler ultrasound were also utilized to evaluate blood flow to the testicles. COMPARISON:  None Available. FINDINGS: Right testicle Measurements: 3.5 x 2.6 x 2.2 cm. Tiny intratesticular cyst measuring 3 mm, of doubtful clinical significance. No solid lesion. Blood flow is demonstrated. Left testicle Measurements: 3.6 x 2.6 x 2.4 cm. Tiny intratesticular cyst measuring 4 mm, of doubtful clinical significance. No solid lesion. Blood flow is demonstrated. Right  epididymis:  Normal in size and appearance.  No hyperemia. Left epididymis:  4 mm epididymal cyst.  No hyperemia. Hydrocele:  None visualized. Varicocele:  None visualized. Pulsed Doppler interrogation of both testes demonstrates normal low resistance arterial and venous waveforms bilaterally. Diffuse subcutaneous edema with scrotal skin and soft tissues measuring up to 3.4 cm on the right at 3.6 cm on the left. No evidence of focal fluid collection. IMPRESSION: 1. Tiny intratesticular cysts, of doubtful clinical significance. Otherwise normal sonographic appearance of the testis. 2. Marked scrotal skin and soft tissue edema. No focal fluid collection. Electronically Signed   By: Chadwick Colonel M.D.   On: 09/11/2023 21:53  Procedures Procedures    Medications Ordered in ED Medications  doxycycline  (VIBRA -TABS) tablet 100 mg (has no administration in time range)    ED Course/ Medical Decision Making/ A&P  Patient is a 73 year old male with extensive past medical history as per HPI presenting with swelling of his scrotum.  Patient arrives with stable vital signs and is afebrile.  Physical examination does reveal an edematous scrotum and distal thighs and both are mildly erythematous.  There is no crepitus or abscess visible.  Laboratory studies obtained including CBC, CMP.  He has no leukocytosis and electrolytes are consistent with his being on dialysis.  Ultrasound obtained of the scrotum showing swelling and edema of the scrotum, but otherwise normal sonographic appearance of the testicles.  Because of the swelling possibly fluid overload related, but also could be related to early cellulitis.  Patient to be treated with doxycycline  and is to follow-up with his primary doctor.  This does not have the appearance of Fournier's gangrene.  There is no crepitus and no gas noted on the ultrasound.  Final Clinical Impression(s) / ED Diagnoses Final diagnoses:  None    Rx / DC Orders ED  Discharge Orders     None         Orvilla Blander, MD 09/12/23 [id number]

## 2023-09-13 ENCOUNTER — Other Ambulatory Visit: Payer: Self-pay | Admitting: Family

## 2023-09-15 ENCOUNTER — Ambulatory Visit (INDEPENDENT_AMBULATORY_CARE_PROVIDER_SITE_OTHER): Admitting: Family Medicine

## 2023-09-15 ENCOUNTER — Encounter: Payer: Self-pay | Admitting: Family Medicine

## 2023-09-15 VITALS — BP 122/82 | Temp 98.0°F | Ht 71.0 in | Wt 198.4 lb

## 2023-09-15 DIAGNOSIS — K219 Gastro-esophageal reflux disease without esophagitis: Secondary | ICD-10-CM

## 2023-09-15 DIAGNOSIS — E1169 Type 2 diabetes mellitus with other specified complication: Secondary | ICD-10-CM

## 2023-09-15 DIAGNOSIS — E1142 Type 2 diabetes mellitus with diabetic polyneuropathy: Secondary | ICD-10-CM | POA: Diagnosis not present

## 2023-09-15 DIAGNOSIS — M109 Gout, unspecified: Secondary | ICD-10-CM | POA: Diagnosis not present

## 2023-09-15 DIAGNOSIS — Z7689 Persons encountering health services in other specified circumstances: Secondary | ICD-10-CM

## 2023-09-15 DIAGNOSIS — Z7984 Long term (current) use of oral hypoglycemic drugs: Secondary | ICD-10-CM

## 2023-09-15 DIAGNOSIS — E785 Hyperlipidemia, unspecified: Secondary | ICD-10-CM

## 2023-09-15 MED ORDER — PANTOPRAZOLE SODIUM 40 MG PO TBEC: 40.0000 mg | DELAYED_RELEASE_TABLET | Freq: Every day | ORAL | 2 refills | Status: DC

## 2023-09-15 MED ORDER — PREGABALIN 150 MG PO CAPS: 150.0000 mg | ORAL_CAPSULE | Freq: Three times a day (TID) | ORAL | 0 refills | Status: DC

## 2023-09-15 NOTE — Assessment & Plan Note (Signed)
 Stable. Continue with Allopurinol  200mg  tablet, take 2 tablets daily.

## 2023-09-15 NOTE — Patient Instructions (Addendum)
-  It was great to meet you and look forward to taking care of you. -Continue all medications. Refilled Pantoprazole  Sodium for GERD. -Placed a referral to pain clinic for diabetic polyneuropathy.  -Controlled substance agreement signed for management of Pregabalin  150mg , 1 tablet three times a day. Did not do urine drug screen since very little urine is made.  -Follow up all specialist.  -Follow up in 3 months.

## 2023-09-15 NOTE — Assessment & Plan Note (Signed)
 Stable. Continue with Rosuvastatin  10mg  daily.

## 2023-09-15 NOTE — Progress Notes (Signed)
 New Patient Office Visit  Subjective   Patient ID: [name], male    DOB: 03/03/1951  Age: 73 y.o. MRN: [id number]  CC:  Chief Complaint  Patient presents with   Establish Care    HPI [name] presents to establish care with new provider. Accompanied with spouse   Patients previous primary care provider: [id number] with Natalie Reid, NP. Last visit was 03/21/2023.   Specialist: [facility] with Donis Furnish.  [facility] with Dr. Gearldean Keepers.  [facility]. At [facility] with Dr. Knox Perl  [facility], has appt on Wednesday  [facility] with Dr. Legrand Puma  [facility]   Gout: Chronic. Patient is prescribed Allopurinol  200mg  tablet, take 2 tablets daily. Not had a gout flare up in a while.   GERD: Chronic. Patient is prescribed Protonix  40mg  daily. Effective.   Neuropathy: Chronic. Patient is prescribed Pregabalin  150mg  tablet, 3 times a day. Takes medication as prescribed. He reports it helps a lot, but "does not completely controlled". He reports he will sometimes take 4 tablets a day instead of 3 as prescribed. He is request a referral to pain management.   Hyperlipidemia: Chronic. Patient is prescribed Rosuvastatin  10mg  daily. Effective. Last lipids drawn with [facility] on 05/13/2023.  HDL 23.  Outpatient Encounter Medications as of 09/15/2023  Medication Sig   acetaminophen  (TYLENOL ) 325 MG tablet Take 2 tablets (650 mg total) by mouth every 4 (four) hours as needed for fever or mild pain.   albuterol  (VENTOLIN  HFA) 108 (90 Base) MCG/ACT inhaler Inhale 2 puffs into the lungs every 6 (six) hours as needed for shortness of breath or wheezing.   allopurinol  (ZYLOPRIM ) 100 MG tablet Take 2 tablets (200 mg total) by mouth daily.   calcitRIOL  (ROCALTROL ) 0.5 MCG capsule Take 0.5 mcg by mouth 3 (three) times a week.   doxycycline   (VIBRAMYCIN ) 100 MG capsule Take 1 capsule (100 mg total) by mouth 2 (two) times daily. One po bid x 7 days   levothyroxine  (SYNTHROID ) 112 MCG tablet Take 1 tablet (112 mcg total) by mouth daily before breakfast.   Methoxy PEG-Epoetin Beta (MIRCERA IJ) Inject into the skin.   modafinil  (PROVIGIL ) 200 MG tablet Take 1 tablet (200 mg total) by mouth daily.   OZEMPIC , 0.25 OR 0.5 MG/DOSE, 2 MG/3ML SOPN Inject 0.25 mg into the skin once a week.   propranolol  (INDERAL ) 40 MG tablet Take 40 mg by mouth daily.   rosuvastatin  (CRESTOR ) 10 MG tablet Take 1 tablet (10 mg total) by mouth daily.   sevelamer  carbonate (RENVELA ) 800 MG tablet Take 800 mg by mouth 3 (three) times daily.   [DISCONTINUED] pregabalin  (LYRICA ) 150 MG capsule Take 150 mg by mouth 3 (three) times daily.   [DISCONTINUED] tiZANidine  (ZANAFLEX ) 2 MG tablet TAKE 1 TABLET (2MG  TOTAL) BY MOUTH TWICE A DAY AS NEEDED FOR MUSCLE SPASM   pantoprazole  (PROTONIX ) 40 MG tablet Take 1 tablet (40 mg total) by mouth daily.   pregabalin  (LYRICA ) 150 MG capsule Take 1 capsule (150 mg total) by mouth 3 (three) times daily.   torsemide  (DEMADEX ) 20 MG tablet Take 1 tablet (20 mg total) by mouth 2 (two) times daily. (Patient not taking: Reported on 09/15/2023)   [DISCONTINUED] ketoconazole  (NIZORAL ) 2 % cream Apply 1 Application topically 2 (two) times daily as needed for irritation. (Patient not taking: Reported on 09/15/2023)   [DISCONTINUED] pantoprazole  (  PROTONIX ) 40 MG tablet TAKE 1 TABLET BY MOUTH EVERY DAY (Patient not taking: Reported on 09/15/2023)   No facility-administered encounter medications on file as of 09/15/2023.    Past Medical History:  Diagnosis Date   Acute osteomyelitis of metatarsal bone of left foot (HCC)    Ambulates with cane    straight cane   Anemia    Cervical myelopathy (HCC) 02/06/2018   Chronic kidney disease    dailysis M [facility] F- home   Community acquired pneumonia of left lower lobe of lung 08/18/2021   Complication of  anesthesia    Coronary artery disease    Dehiscence of amputation stump of left lower extremity (HCC)    Diabetes mellitus without complication (HCC)    type2   Diabetic foot ulcer (HCC)    Diabetic neuropathy (HCC) 02/06/2018   Dysrhythmia    Gait abnormality 08/24/2018   GERD (gastroesophageal reflux disease)     01/06/20- not current   Gout    History of blood transfusion    History of blood transfusion    History of kidney stones    passed stones   Hypercholesteremia    Hypertension    Hypothyroidism    Neuromuscular disorder (HCC)    neuropathy left leg and bilateral feet   Neuropathy    Partial nontraumatic amputation of foot, 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 INVASIVE CV LAB;  Service: Vascular;  Laterality: N/A;   AMPUTATION Left 12/25/2013   Procedure: AMPUTATION RAY LEFT 5TH RAY;  Surgeon: Timothy Ford, MD;  Location: [facility] ORS;  Service: Orthopedics;  Laterality: Left;   AMPUTATION Right 12/15/2018   Procedure: AMPUTATION OF 4TH AND 5TH TOES RIGHT FOOT;  Surgeon: Timothy Ford, MD;  Location[facility];  Service: Orthopedics;  Laterality: Right;   AMPUTATION Left 02/02/2021   Procedure: LEFT FOOT 4TH RAY AMPUTATION;  Surgeon: Timothy Ford, MD;  Location: [facility] OR;  Service: Orthopedics;  Laterality: Left;   AMPUTATION Left 05/09/2021   Procedure: LEFT BELOW KNEE AMPUTATION;  Surgeon: Timothy Ford, MD;  Location: [facility] OR;  Service: Orthopedics;  Laterality: Left;   AMPUTATION Right 07/26/2022   Procedure: RIGHT BELOW KNEE  AMPUTATION;  Surgeon: Timothy Ford, MD;  Location: [facility] OR;  Service: Orthopedics;  Laterality: Right;   APPLICATION OF WOUND VAC Right 12/15/2018   Procedure: APPLICATION OF WOUND VAC;  Surgeon: Timothy Ford, MD;  Location: [facility] OR;  Service: Orthopedics;  Laterality: Right;   AV FISTULA PLACEMENT Left 03/25/2019   Procedure: LEFT ARM ARTERIOVENOUS (AV) FISTULA CREATION;  Surgeon: Margherita Shell, MD;  Location: [facility] OR;  Service: Vascular;  Laterality: Left;   BACK SURGERY     BASCILIC VEIN TRANSPOSITION Left 05/20/2019   Procedure: SECOND STAGE LEFT BASCILIC VEIN TRANSPOSITION;  Surgeon: Margherita Shell, MD;  Location: [facility] OR;  Service: Vascular;  Laterality: Left;   BIOPSY  06/22/2022   Procedure: BIOPSY;  Surgeon: Normie Becton., MD;  Location: [facility] ENDOSCOPY;  Service: Gastroenterology;;  CARDIAC CATHETERIZATION  02/17/2014   CATARACT EXTRACTION Bilateral    CHOLECYSTECTOMY     COLONOSCOPY  2011   in [place], Normal   COLONOSCOPY N/A 06/23/2022   Procedure: COLONOSCOPY;  Surgeon: Albertina Hugger, MD;  Location: [facility] ENDOSCOPY;  Service: Gastroenterology;  Laterality: N/A;   CORONARY ARTERY BYPASS GRAFT  2008   CYSTOSCOPY N/A 06/29/2020   Procedure: CYSTOSCOPY WITH CLOT EVACUATION AND  FULGERATION;  Surgeon: Trent Frizzle, MD;  Location: [facility] OR;  Service: Urology;  Laterality: N/A;   CYSTOSCOPY WITH FULGERATION Bilateral 06/17/2020   Procedure: CYSTOSCOPY,BILATERAL RETROGRADE, CLOT EVACUATION WITH FULGERATION OF THE BLADDER;  Surgeon: Lahoma Pigg, MD;  Location: [facility] OR;  Service: Urology;  Laterality: Bilateral;   CYSTOSCOPY WITH FULGERATION N/A 06/28/2020   Procedure: CYSTOSCOPY WITH CLOT EVACUATION AND FULGERATION OF BLEEDERS;  Surgeon: Trent Frizzle, MD;  Location: [facility] OR;  Service: Urology;  Laterality: N/A;  1 HR   ENTEROSCOPY N/A 06/22/2022   Procedure: ENTEROSCOPY;  Surgeon: Brice Campi Albino Alu., MD;  Location: [facility] ENDOSCOPY;  Service: Gastroenterology;   Laterality: N/A;   HEMOSTASIS CLIP PLACEMENT  06/22/2022   Procedure: HEMOSTASIS CLIP PLACEMENT;  Surgeon: Normie Becton., MD;  Location: [facility] ENDOSCOPY;  Service: Gastroenterology;;   HOT HEMOSTASIS N/A 06/22/2022   Procedure: HOT HEMOSTASIS (ARGON PLASMA COAGULATION/BICAP);  Surgeon: Normie Becton., MD;  Location: [facility] ENDOSCOPY;  Service: Gastroenterology;  Laterality: N/A;   I & D EXTREMITY Right 12/15/2018   Procedure: DEBRIDEMENT RIGHT FOOT;  Surgeon: Timothy Ford, MD;  Location: [facility] OR;  Service: Orthopedics;  Laterality: Right;   I & D EXTREMITY Right 08/27/2019   Procedure: PARTIAL CUBOID EXCISION RIGHT FOOT;  Surgeon: Timothy Ford, MD;  Location: [facility] OR;  Service: Orthopedics;  Laterality: Right;   I & D EXTREMITY Right 01/07/2020   Procedure: RIGHT FOOT EXCISION INFECTED BONE;  Surgeon: Timothy Ford, MD;  Location: [facility] OR;  Service: Orthopedics;  Laterality: Right;   I & D EXTREMITY Right 05/17/2022   Procedure: IRRIGATION AND DEBRIDEMENT RIGHT FOOT ABSCESS;  Surgeon: Timothy Ford, MD;  Location: [facility] OR;  Service: Orthopedics;  Laterality: Right;   IR PARACENTESIS  10/11/2022   IR PARACENTESIS  12/24/2022   IR PARACENTESIS  02/06/2023   IR PARACENTESIS  05/05/2023   IR PARACENTESIS  05/19/2023   IR PARACENTESIS  07/11/2023   IR PARACENTESIS  07/30/2023   IR PARACENTESIS  08/20/2023   NECK SURGERY     novemver 2019   PERIPHERAL VASCULAR BALLOON ANGIOPLASTY Left 03/05/2023   Procedure: PERIPHERAL VASCULAR BALLOON ANGIOPLASTY;  Surgeon: Philipp Brawn, MD;  Location: [facility] INVASIVE CV LAB;  Service: Vascular;  Laterality: Left;  AVF   POLYPECTOMY  06/22/2022   Procedure: POLYPECTOMY;  Surgeon: Mansouraty, Albino Alu., MD;  Location: [facility] ENDOSCOPY;  Service: Gastroenterology;;   POLYPECTOMY  06/23/2022   Procedure: POLYPECTOMY;  Surgeon: Albertina Hugger, MD;  Location: [facility] ENDOSCOPY;  Service: Gastroenterology;;   STUMP REVISION Left 06/27/2021   Procedure:  REVISION LEFT BELOW KNEE AMPUTATION;  Surgeon: Timothy Ford, MD;  Location: [facility] OR;  Service: Orthopedics;  Laterality: Left;   SUBMUCOSAL LIFTING INJECTION  06/22/2022   Procedure: SUBMUCOSAL LIFTING INJECTION;  Surgeon: Brice Campi Albino Alu., MD;  Location: [facility] ENDOSCOPY;  Service: Gastroenterology;;   SUBMUCOSAL TATTOO INJECTION  06/22/2022   Procedure: SUBMUCOSAL TATTOO INJECTION;  Surgeon: Normie Becton., MD;  Location: [facility] ENDOSCOPY;  Service: Gastroenterology;;   TRANSURETHRAL RESECTION OF PROSTATE N/A 06/28/2020  Procedure: TRANSURETHRAL RESECTION OF THE PROSTATE (TURP);  Surgeon: Trent Frizzle, MD;  Location: [facility] OR;  Service: Urology;  Laterality: N/A;   WISDOM TOOTH EXTRACTION      Family History  Problem Relation Age of Onset   Diabetes Mellitus II Mother    Kidney disease Mother    Diabetes Mellitus II Father    CAD Father    Cancer Father        prostate   Kidney disease Father    Diabetes Mellitus II Brother    Kidney disease Brother    Diabetes Mellitus II Brother    Stomach cancer Brother 33   Kidney disease Brother    Colon cancer Neg Hx    Colon polyps Neg Hx    Esophageal cancer Neg Hx    Rectal cancer Neg Hx    Pancreatic cancer Neg Hx     Social History   Socioeconomic History   Marital status: Married    Spouse name: Not on file   Number of children: 2   Years of education: Not on file   Highest education level: Bachelor's degree (e.g., BA, AB, BS)  Occupational History   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  Tobacco Use   Smoking status: Former    Types: Cigars    Start date: 04/08/1973    Quit date: 12/24/1988    Years since quitting: 34.7   Smokeless tobacco: Never   Tobacco comments:    Cigars and Pipe   Vaping Use   Vaping status: Never Used  Substance and Sexual Activity   Alcohol  use: Never   Drug use: Never   Sexual activity: Yes    Partners: Female  Other Topics Concern   Not on file  Social History Narrative    Regular exercise: yes 3 times a week   Caffeine use: hot tea   Social Drivers of Corporate investment banker Strain: Low Risk  (09/15/2023)   Overall Financial Resource Strain (CARDIA)    Difficulty of Paying Living Expenses: Not hard at all  Food Insecurity: Low Risk  (06/19/2023)   Received from [facility]   Hunger Vital Sign    Worried About Running Out of Food in the Last Year: Never true    Ran Out of Food in the Last Year: Never true  Transportation Needs: No Transportation Needs (06/19/2023)   Received from [facility]    In the past 12 months, has lack of reliable transportation kept you from medical appointments, meetings, work or from getting things needed for daily living? : No  Physical Activity: Inactive (09/15/2023)   Exercise Vital Sign    Days of Exercise per Week: 0 days    Minutes of Exercise per Session: 0 min  Stress: No Stress Concern Present (09/15/2023)   [employer] of Occupational Health - Occupational Stress Questionnaire    Feeling of Stress : Not at all  Social Connections: Moderately Isolated (05/05/2023)   Social Connection and Isolation Panel [NHANES]    Frequency of Communication with Friends and Family: More than three times a week    Frequency of Social Gatherings with Friends and Family: More than three times a week    Attends Religious Services: Never    Database administrator or Organizations: No    Attends Banker Meetings: Never    Marital Status: Married  Catering manager Violence: Not At Risk (05/05/2023)   Humiliation, Afraid, Rape, and Kick questionnaire    Fear  of Current or Ex-Partner: No    Emotionally Abused: No    Physically Abused: No    Sexually Abused: No    ROS See HPI above    Objective  BP 122/82   Temp 98 F (36.7 C) (Oral)   Ht 5\' 11"  (1.803 m)   Wt 198 lb 6.6 oz (90 kg)   BMI 27.67 kg/m   Physical Exam Vitals reviewed.  Constitutional:      General: He is not in acute  distress.    Appearance: Normal appearance. He is not ill-appearing, toxic-appearing or diaphoretic.  HENT:     Head: Normocephalic and atraumatic.  Eyes:     General:        Right eye: No discharge.        Left eye: No discharge.     Conjunctiva/sclera: Conjunctivae normal.  Cardiovascular:     Rate and Rhythm: Normal rate and regular rhythm.     Heart sounds: Normal heart sounds. No murmur heard.    No friction rub. No gallop.  Pulmonary:     Effort: Pulmonary effort is normal. No respiratory distress.     Breath sounds: Normal breath sounds.  Musculoskeletal:        General: Normal range of motion.     Right Lower Extremity: Right leg is amputated below knee.     Left Lower Extremity: Left leg is amputated below knee.  Skin:    General: Skin is warm and dry.  Neurological:     General: No focal deficit present.     Mental Status: He is alert and oriented to person, place, and time. Mental status is at baseline.     Gait: Gait abnormal (Rollator).  Psychiatric:        Mood and Affect: Mood normal.        Behavior: Behavior normal.        Thought Content: Thought content normal.        Judgment: Judgment normal.      Assessment & Plan:  Gastroesophageal reflux disease without esophagitis -     Pantoprazole  Sodium; Take 1 tablet (40 mg total) by mouth daily.  Dispense: 90 tablet; Refill: 2  Diabetic polyneuropathy associated with type 2 diabetes mellitus (HCC) -     Ambulatory referral to Pain Clinic -     Pregabalin ; Take 1 capsule (150 mg total) by mouth 3 (three) times daily.  Dispense: 90 capsule; Refill: 0  Hyperlipidemia associated with type 2 diabetes mellitus (HCC) Assessment & Plan: Stable. Continue with Rosuvastatin  10mg  daily.    Gout, unspecified cause, unspecified chronicity, unspecified site Assessment & Plan: Stable. Continue with Allopurinol  200mg  tablet, take 2 tablets daily.    Encounter to establish care   1.Review health maintenance:  -Covid  booster: Not UTD -Tdap vaccine: Thinks it has been within the last 10 years  -AWV: Needs an appointment  -Ophthalmology: [facility] request records  -Zoster vaccine: Declines  2.Continue all medications. Refilled Pantoprazole  Sodium for GERD. 3. Placed a referral to pain clinic for diabetic polyneuropathy.  4. Controlled substance agreement signed for management of Pregabalin  150mg , 1 tablet three times a day. Did not do urine drug screen since very little urine is made.  5. Follow up all specialist.  Return in about 3 months (around 12/16/2023) for AWV either with Alvy Baar, LPN or Dr. Burdette Carolin; , chronic management.   Jamye Balicki, NP

## 2023-09-19 ENCOUNTER — Ambulatory Visit (HOSPITAL_COMMUNITY)
Admission: RE | Admit: 2023-09-19 | Discharge: 2023-09-19 | Disposition: A | Discharge status: Home / Self Care | Source: Ambulatory Visit | Attending: Nephrology | Admitting: Nephrology

## 2023-09-19 DIAGNOSIS — N186 End stage renal disease: Secondary | ICD-10-CM | POA: Diagnosis not present

## 2023-09-19 DIAGNOSIS — R188 Other ascites: Secondary | ICD-10-CM | POA: Insufficient documentation

## 2023-09-19 HISTORY — PX: IR PARACENTESIS: IMG2679

## 2023-09-19 MED ORDER — ALBUMIN HUMAN 25 % IV SOLN: 50.0000 g | Freq: Once | INTRAVENOUS | Status: DC

## 2023-09-19 MED ORDER — ALBUMIN HUMAN 25 % IV SOLN
INTRAVENOUS | Status: AC
  Filled 2023-09-19: qty 200

## 2023-09-19 MED ORDER — LIDOCAINE HCL 1 % IJ SOLN
20.0000 mL | Freq: Once | INTRAMUSCULAR | Status: AC
  Administered 2023-09-19: 10 mL via INTRADERMAL

## 2023-09-19 NOTE — Addendum Note (Signed)
 Encounter addended by: Orland Bishop L on: 09/19/2023 1:29 PM  Actions taken: Imaging Exam ended

## 2023-09-19 NOTE — Addendum Note (Signed)
 Encounter addended by: Orland Bishop L on: 09/19/2023 2:20 PM  Actions taken: Imaging Exam ended, Flowsheet accepted

## 2023-09-19 NOTE — Procedures (Signed)
 PROCEDURE SUMMARY:  Successful US  guided paracentesis from right lateral abdomen.  Yielded 6 liters of clear yellow fluid.  No immediate complications.  Patient tolerated well.  EBL = trace  Emigdio Wildeman S Nahiara Kretzschmar PA-C 09/19/2023 12:59 PM

## 2023-09-24 ENCOUNTER — Telehealth: Payer: Self-pay | Admitting: Orthopedic Surgery

## 2023-09-24 ENCOUNTER — Encounter: Payer: Self-pay | Admitting: Physician Assistant

## 2023-09-24 ENCOUNTER — Ambulatory Visit (INDEPENDENT_AMBULATORY_CARE_PROVIDER_SITE_OTHER): Admitting: Physician Assistant

## 2023-09-24 DIAGNOSIS — L97821 Non-pressure chronic ulcer of other part of left lower leg limited to breakdown of skin: Secondary | ICD-10-CM

## 2023-09-24 NOTE — Telephone Encounter (Signed)
 I called and lm on vm to advise pt that I can make an appt for him next Tuesday afternoon or Thursday afternoon at 2:30 just to leave a message and let me know what day works best for him

## 2023-09-24 NOTE — Telephone Encounter (Signed)
 Pt called [facility] bas ck to set an appt for next week. Pt only wasn't t speak to Autumn F.

## 2023-09-24 NOTE — Progress Notes (Signed)
 Office Visit Note   Patient: [name]           Date of Birth: Sep 18, 1950           MRN: [id number] Visit Date: 09/24/2023              Requested by: Francenia Ingle, NP [address] [place],  [place] [address] PCP: Francenia Ingle, NP  Chief Complaint  Patient presents with   Left Leg - Follow-up    Bilateral BKA irritation    Right Leg - Follow-up      HPI: 73 y/o male with history of B BKA by Dr. Julio Ohm. Right BKA 07/26/22 and left BKA 06/27/21.  He states that 2 weeks ago he became very edematous with ascites extending into B BKA.  He has superficial skin abrasions/fissure.  He denies fever and chills.    Assessment & Plan: Visit Diagnoses:  1. Skin ulcer of left knee, limited to breakdown of skin (HCC)     Plan: He will try not to wear his prosthetics unless neccessary to allow for wound healing.  Shower daily with soap and water  then clean wounds with Vashe.    Follow-Up Instructions: No follow-ups on file.   Ortho Exam  Patient is alert, oriented, no adenopathy, well-dressed, normal affect, normal respiratory effort. No signs of infection No fluid collection/fluctuance or warmth to touch Slight tenderness to touch on the left stump. No edema present     Imaging: No results found.    Labs: Lab Results  Component Value Date   HGBA1C 5.0 05/17/2022   HGBA1C 5.5 05/04/2021   HGBA1C 6.3 (H) 02/02/2021   ESRSEDRATE 34 (H) 01/24/2020   ESRSEDRATE 41 (H) 01/10/2020   ESRSEDRATE 55 (H) 12/27/2019   CRP 8.8 (H) 01/24/2020   CRP 35.9 (H) 01/10/2020   CRP 77.9 (H) 12/27/2019   REPTSTATUS 05/08/2023 FINAL 05/05/2023   GRAMSTAIN NO WBC SEEN NO ORGANISMS SEEN  05/05/2023   CULT  05/05/2023    NO GROWTH 3 DAYS Performed at [facility], [address], [place], [place] [address]    LABORGA PROTEUS MIRABILIS 05/17/2022   LABORGA ENTEROCOCCUS FAECALIS 05/17/2022   LABORGA STAPHYLOCOCCUS AUREUS 05/17/2022     Lab Results  Component  Value Date   ALBUMIN  3.3 (L) 09/11/2023   ALBUMIN  2.6 (L) 05/06/2023   ALBUMIN  4.5 05/04/2023   PREALBUMIN 32.7 05/09/2021   PREALBUMIN 14.8 (L) 12/14/2018    Lab Results  Component Value Date   MG 1.7 05/05/2023   MG 2.0 07/25/2022   MG 1.6 (L) 07/23/2022   Lab Results  Component Value Date   VD25OH 42.0 11/19/2021   VD25OH 16.13 (L) 05/09/2021    Lab Results  Component Value Date   PREALBUMIN 32.7 05/09/2021   PREALBUMIN 14.8 (L) 12/14/2018      Latest Ref Rng & Units 09/11/2023    8:15 PM 05/06/2023    5:57 AM 05/04/2023    4:20 PM  CBC EXTENDED  WBC 4.0 - 10.5 K/uL 5.1  5.9  9.4   RBC 4.22 - 5.81 MIL/uL 4.20  2.96  3.87   Hemoglobin 13.0 - 17.0 g/dL 82.9  8.4  56.2   HCT 13.0 - 52.0 % 35.9  26.1  34.1   Platelets 150 - 400 K/uL 112  82  119   NEUT# 1.7 - 7.7 K/uL 3.9   8.3   Lymph# 0.7 - 4.0 K/uL 0.4   0.3  There is no height or weight on file to calculate BMI.  Orders:  No orders of the defined types were placed in this encounter.  No orders of the defined types were placed in this encounter.    Procedures: No procedures performed  Clinical Data: No additional findings.  ROS:  All other systems negative, except as noted in the HPI. Review of Systems  Objective: Vital Signs: There were no vitals taken for this visit.  Specialty Comments:  No specialty comments available.  PMFS History: Patient Active Problem List   Diagnosis Date Noted   Acute respiratory failure with hypoxia (HCC) 05/05/2023   Ascites 05/05/2023   Hyponatremia 05/05/2023   Right below-knee amputee (HCC) 07/29/2022   Sepsis due to pneumonia (HCC) 07/23/2022   Foot infection 07/22/2022   Acute respiratory failure (HCC) 07/22/2022   Anemia due to chronic blood loss 06/23/2022   Heme positive stool 06/23/2022   Benign neoplasm of cecum 06/23/2022   Benign neoplasm of transverse colon 06/23/2022   Benign neoplasm of descending colon 06/23/2022   Abscess of right foot  05/17/2022   Contraindication to anticoagulation therapy GU Bleed 03/13/2022   Permanent atrial fibrillation (HCC) 08/18/2021   Anemia of chronic renal failure 08/18/2021   Thrombocytopenia (HCC) 08/18/2021   Acute metabolic encephalopathy 08/18/2021   Action induced myoclonus 08/18/2021   Left below-knee amputee (HCC) 05/11/2021   Anemia of chronic disease 09/13/2020   COVID-19 virus infection 09/13/2020   Pressure injury of skin 06/29/2020   Gross hematuria 06/16/2020   Typical atrial flutter (HCC) 03/24/2020   Bilateral pleural effusion 02/24/2020   Healthcare maintenance 01/28/2020   Shortness of breath 01/28/2020   History of partial ray amputation of fourth toe of right foot (HCC) 09/08/2019   Chronic cough 06/25/2019   Cutaneous abscess of right foot    Subacute osteomyelitis, right ankle and foot (HCC) 12/14/2018   AKI (acute kidney injury) (HCC) 12/14/2018   ESRD on dialysis (HCC) 12/14/2018   S/P CABG (coronary artery bypass graft) 12/11/2018   Gait abnormality 08/24/2018   Diabetic neuropathy (HCC) 02/06/2018   Cervical myelopathy (HCC) 02/06/2018   Onychomycosis 10/30/2017   Other spondylosis with radiculopathy, cervical region 01/27/2017   Midfoot ulcer, right, limited to breakdown of skin (HCC) 11/15/2016   Lateral epicondylitis, left elbow 08/12/2016   Prostate cancer (HCC) 06/19/2016   Cellulitis of right foot 12/24/2015   Gout 09/14/2012   Essential hypertension 09/14/2012   Hypothyroidism 09/14/2012   CAD (coronary artery disease) 09/14/2012   Insulin  dependent type 2 diabetes mellitus (HCC) 09/14/2012   Hyperlipidemia associated with type 2 diabetes mellitus (HCC)    Neuropathy (HCC)    Past Medical History:  Diagnosis Date   Acute osteomyelitis of metatarsal bone of left foot (HCC)    Ambulates with cane    straight cane   Anemia    Cervical myelopathy (HCC) 02/06/2018   Chronic kidney disease    dailysis [facility]- home   Community acquired pneumonia  of left lower lobe of lung 08/18/2021   Complication of anesthesia    Coronary artery disease    Dehiscence of amputation stump of left lower extremity (HCC)    Diabetes mellitus without complication (HCC)    type2   Diabetic foot ulcer (HCC)    Diabetic neuropathy (HCC) 02/06/2018   Dysrhythmia    Gait abnormality 08/24/2018   GERD (gastroesophageal reflux disease)     01/06/20- not current   Gout    History of blood  transfusion    History of blood transfusion    History of kidney stones    passed stones   Hypercholesteremia    Hypertension    Hypothyroidism    Neuromuscular disorder (HCC)    neuropathy left leg and bilateral feet   Neuropathy    Partial nontraumatic amputation of foot, 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   PONV (postoperative nausea and vomiting)    Prostate cancer (HCC)    PVD (peripheral vascular disease) (HCC)    with amputations    Family History  Problem Relation Age of Onset   Diabetes Mellitus II Mother    Kidney disease Mother    Diabetes Mellitus II Father    CAD Father    Cancer Father        prostate   Kidney disease Father    Diabetes Mellitus II Brother    Kidney disease Brother    Diabetes Mellitus II Brother    Stomach cancer Brother 27   Kidney disease Brother    Colon cancer Neg Hx    Colon polyps Neg Hx    Esophageal cancer Neg Hx    Rectal cancer Neg Hx    Pancreatic cancer Neg Hx     Past Surgical History:  Procedure Laterality Date   A-FLUTTER ABLATION N/A 04/06/2020   Procedure: A-FLUTTER ABLATION;  Surgeon: Tammie Fall, MD;  Location: [facility] INVASIVE CV LAB;  Service: Cardiovascular;  Laterality: N/A;   A/V FISTULAGRAM N/A 03/05/2023   Procedure: A/V Fistulagram;  Surgeon: Philipp Brawn, MD;  Location: [facility] INVASIVE CV LAB;  Service: Vascular;  Laterality: N/A;   AMPUTATION Left 12/25/2013   Procedure: AMPUTATION RAY LEFT 5TH RAY;  Surgeon: Timothy Ford, MD;  Location: [facility] ORS;  Service: Orthopedics;  Laterality: Left;   AMPUTATION  Right 12/15/2018   Procedure: AMPUTATION OF 4TH AND 5TH TOES RIGHT FOOT;  Surgeon: Timothy Ford, MD;  Location: [facility] OR;  Service: Orthopedics;  Laterality: Right;   AMPUTATION Left 02/02/2021   Procedure: LEFT FOOT 4TH RAY AMPUTATION;  Surgeon: Timothy Ford, MD;  Location: [facility] OR;  Service: Orthopedics;  Laterality: Left;   AMPUTATION Left 05/09/2021   Procedure: LEFT BELOW KNEE AMPUTATION;  Surgeon: Timothy Ford, MD;  Location: [facility] OR;  Service: Orthopedics;  Laterality: Left;   AMPUTATION Right 07/26/2022   Procedure: RIGHT BELOW KNEE AMPUTATION;  Surgeon: Timothy Ford, MD;  Location: [facility] OR;  Service: Orthopedics;  Laterality: Right;   APPLICATION OF WOUND VAC Right 12/15/2018   Procedure: APPLICATION OF WOUND VAC;  Surgeon: Timothy Ford, MD;  Location: [facility] OR;  Service: Orthopedics;  Laterality: Right;   AV FISTULA PLACEMENT Left 03/25/2019   Procedure: LEFT ARM ARTERIOVENOUS (AV) FISTULA CREATION;  Surgeon: Margherita Shell, MD;  Location: [facility] OR;  Service: Vascular;  Laterality: Left;   BACK SURGERY     BASCILIC VEIN TRANSPOSITION Left 05/20/2019   Procedure: SECOND STAGE LEFT BASCILIC VEIN TRANSPOSITION;  Surgeon: Margherita Shell, MD;  Location: [facility] OR;  Service: Vascular;  Laterality: Left;   BIOPSY  06/22/2022   Procedure: BIOPSY;  Surgeon: Normie Becton., MD;  Location: [facility] ENDOSCOPY;  Service: Gastroenterology;;   CARDIAC CATHETERIZATION  02/17/2014   CATARACT EXTRACTION Bilateral    CHOLECYSTECTOMY     COLONOSCOPY  2011   [place], [place]   COLONOSCOPY N/A 06/23/2022   Procedure: COLONOSCOPY;  Surgeon: Albertina Hugger, MD;  Location: [facility] ENDOSCOPY;  Service: Gastroenterology;  Laterality: N/A;  CORONARY ARTERY BYPASS GRAFT  2008   CYSTOSCOPY N/A 06/29/2020   Procedure: CYSTOSCOPY WITH CLOT EVACUATION AND  FULGERATION;  Surgeon: Trent Frizzle, MD;  Location: [facility] OR;  Service: Urology;  Laterality: N/A;   CYSTOSCOPY WITH FULGERATION Bilateral 06/17/2020    Procedure: CYSTOSCOPY,BILATERAL RETROGRADE, CLOT EVACUATION WITH FULGERATION OF THE BLADDER;  Surgeon: Lahoma Pigg, MD;  Location: [facility] OR;  Service: Urology;  Laterality: Bilateral;   CYSTOSCOPY WITH FULGERATION N/A 06/28/2020   Procedure: CYSTOSCOPY WITH CLOT EVACUATION AND FULGERATION OF BLEEDERS;  Surgeon: Trent Frizzle, MD;  Location: [name] OR;  Service: Urology;  Laterality: N/A;  1 HR   ENTEROSCOPY N/A 06/22/2022   Procedure: ENTEROSCOPY;  Surgeon: Brice Campi Albino Alu., MD;  Location: [facility] ENDOSCOPY;  Service: Gastroenterology;  Laterality: N/A;   HEMOSTASIS CLIP PLACEMENT  06/22/2022   Procedure: HEMOSTASIS CLIP PLACEMENT;  Surgeon: Normie Becton., MD;  Location: [facility] ENDOSCOPY;  Service: Gastroenterology;;   HOT HEMOSTASIS N/A 06/22/2022   Procedure: HOT HEMOSTASIS (ARGON PLASMA COAGULATION/BICAP);  Surgeon: Normie Becton., MD;  Location: [facility] ENDOSCOPY;  Service: Gastroenterology;  Laterality: N/A;   I & D EXTREMITY Right 12/15/2018   Procedure: DEBRIDEMENT RIGHT FOOT;  Surgeon: Timothy Ford, MD;  Location: [facility] OR;  Service: Orthopedics;  Laterality: Right;   I & D EXTREMITY Right 08/27/2019   Procedure: PARTIAL CUBOID EXCISION RIGHT FOOT;  Surgeon: Timothy Ford, MD;  Location: [facility] OR;  Service: Orthopedics;  Laterality: Right;   I & D EXTREMITY Right 01/07/2020   Procedure: RIGHT FOOT EXCISION INFECTED BONE;  Surgeon: Timothy Ford, MD;  Location: [facility] OR;  Service: Orthopedics;  Laterality: Right;   I & D EXTREMITY Right 05/17/2022   Procedure: IRRIGATION AND DEBRIDEMENT RIGHT FOOT ABSCESS;  Surgeon: Timothy Ford, MD;  Location: [facility] OR;  Service: Orthopedics;  Laterality: Right;   IR PARACENTESIS  10/11/2022   IR PARACENTESIS  12/24/2022   IR PARACENTESIS  02/06/2023   IR PARACENTESIS  05/05/2023   IR PARACENTESIS  05/19/2023   IR PARACENTESIS  07/11/2023   IR PARACENTESIS  07/30/2023   IR PARACENTESIS  08/20/2023   IR PARACENTESIS  09/19/2023   NECK  SURGERY     novemver 2019   PERIPHERAL VASCULAR BALLOON ANGIOPLASTY Left 03/05/2023   Procedure: PERIPHERAL VASCULAR BALLOON ANGIOPLASTY;  Surgeon: Philipp Brawn, MD;  Location: [facility] INVASIVE CV LAB;  Service: Vascular;  Laterality: Left;  AVF   POLYPECTOMY  06/22/2022   Procedure: POLYPECTOMY;  Surgeon: Mansouraty, Albino Alu., MD;  Location: [facility] ENDOSCOPY;  Service: Gastroenterology;;   POLYPECTOMY  06/23/2022   Procedure: POLYPECTOMY;  Surgeon: Albertina Hugger, MD;  Location: [facility] ENDOSCOPY;  Service: Gastroenterology;;   STUMP REVISION Left 06/27/2021   Procedure: REVISION LEFT BELOW KNEE AMPUTATION;  Surgeon: Timothy Ford, MD;  Location: [facility] OR;  Service: Orthopedics;  Laterality: Left;   SUBMUCOSAL LIFTING INJECTION  06/22/2022   Procedure: SUBMUCOSAL LIFTING INJECTION;  Surgeon: Brice Campi Albino Alu., MD;  Location: [facility] ENDOSCOPY;  Service: Gastroenterology;;   SUBMUCOSAL TATTOO INJECTION  06/22/2022   Procedure: SUBMUCOSAL TATTOO INJECTION;  Surgeon: Normie Becton., MD;  Location: [facility] ENDOSCOPY;  Service: Gastroenterology;;   TRANSURETHRAL RESECTION OF PROSTATE N/A 06/28/2020   Procedure: TRANSURETHRAL RESECTION OF THE PROSTATE (TURP);  Surgeon: Trent Frizzle, MD;  Location: [facility] OR;  Service: Urology;  Laterality: N/A;   WISDOM TOOTH EXTRACTION     Social History   Occupational History   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  Tobacco Use  Smoking status: Former    Types: Cigars    Start date: 04/08/1973    Quit date: 12/24/1988    Years since quitting: 34.7   Smokeless tobacco: Never   Tobacco comments:    Cigars and Pipe   Vaping Use   Vaping status: Never Used  Substance and Sexual Activity   Alcohol  use: Never   Drug use: Never   Sexual activity: Yes    Partners: Female

## 2023-09-24 NOTE — Telephone Encounter (Signed)
 Pt had a dr office call to set an appt for open wound and only want to see [name]. Pt does want to see [name]. Please call pt at [phone]

## 2023-09-24 NOTE — Telephone Encounter (Signed)
Pt came in for appt today.  

## 2023-10-02 ENCOUNTER — Ambulatory Visit (INDEPENDENT_AMBULATORY_CARE_PROVIDER_SITE_OTHER): Admitting: Orthopedic Surgery

## 2023-10-02 DIAGNOSIS — L97821 Non-pressure chronic ulcer of other part of left lower leg limited to breakdown of skin: Secondary | ICD-10-CM

## 2023-10-02 DIAGNOSIS — Z89512 Acquired absence of left leg below knee: Secondary | ICD-10-CM | POA: Diagnosis not present

## 2023-10-02 DIAGNOSIS — Z89511 Acquired absence of right leg below knee: Secondary | ICD-10-CM | POA: Diagnosis not present

## 2023-10-02 DIAGNOSIS — S88111A Complete traumatic amputation at level between knee and ankle, right lower leg, initial encounter: Secondary | ICD-10-CM

## 2023-10-02 DIAGNOSIS — S88112A Complete traumatic amputation at level between knee and ankle, left lower leg, initial encounter: Secondary | ICD-10-CM

## 2023-10-03 ENCOUNTER — Ambulatory Visit (HOSPITAL_COMMUNITY)
Admission: RE | Admit: 2023-10-03 | Discharge: 2023-10-03 | Disposition: A | Discharge status: Home / Self Care | Source: Ambulatory Visit | Attending: Nephrology | Admitting: Nephrology

## 2023-10-03 DIAGNOSIS — N186 End stage renal disease: Secondary | ICD-10-CM | POA: Insufficient documentation

## 2023-10-03 DIAGNOSIS — R188 Other ascites: Secondary | ICD-10-CM | POA: Insufficient documentation

## 2023-10-03 HISTORY — PX: IR PARACENTESIS: IMG2679

## 2023-10-03 MED ORDER — LIDOCAINE HCL 1 % IJ SOLN
20.0000 mL | Freq: Once | INTRAMUSCULAR | Status: AC
  Administered 2023-10-03: 20 mL

## 2023-10-03 MED ORDER — LIDOCAINE HCL 1 % IJ SOLN
INTRAMUSCULAR | Status: AC
  Filled 2023-10-03: qty 20

## 2023-10-03 NOTE — Procedures (Signed)
 PROCEDURE SUMMARY:  Successful image-guided paracentesis from the left lower abdomen.  Yielded 7.5 liters of clear, yellow fluid.  No immediate complications.  EBL = trace. Patient tolerated well.   Specimen was not sent for labs.  Please see imaging section of Epic for full dictation.   Lavanda JAYSON Jurist PA-C 10/03/2023 2:09 PM

## 2023-10-04 ENCOUNTER — Other Ambulatory Visit: Payer: Self-pay | Admitting: Physical Medicine and Rehabilitation

## 2023-10-06 ENCOUNTER — Encounter: Payer: Self-pay | Admitting: Orthopedic Surgery

## 2023-10-06 NOTE — Progress Notes (Signed)
 Office Visit Note   Patient: [name]           Date of Birth: 06-22-1950           MRN: [id number] Visit Date: 10/02/2023              Requested by: [name], NP [address] [place],  [place] [address] PCP: [name], NP  Chief Complaint  Patient presents with   Right Leg - Follow-up    Bilateral BKA   Left Leg - Follow-up      HPI Patient is a 73 year old gentleman with bilateral below-knee amputations.  Patient states he still has and bearing pain.  Assessment & Plan: Visit Diagnoses: No diagnosis found.  Plan: Recommended patient wear his compression socks.  He will follow-up with [facility] to have the socket suggested.  Follow-Up Instructions: Return if symptoms worsen or fail to improve.   Ortho Exam  Patient is alert, oriented, no adenopathy, well-dressed, normal affect, normal respiratory effort. Patient is having fluctuation in the volume of the residual limb.  He currently has new liners which show no breakdown.  Patient has and bearing ulcers on both transtibial amputations that are tender to palpation.  There is no exposed bone or tendon no cellulitis.  The ulcers are superficial.  Patient does have shortness of breath seated and does have ascites.    Imaging: No results found. No images are attached to the encounter.  Labs: Lab Results  Component Value Date   HGBA1C 5.0 05/17/2022   HGBA1C 5.5 05/04/2021   HGBA1C 6.3 (H) 02/02/2021   ESRSEDRATE 34 (H) 01/24/2020   ESRSEDRATE 41 (H) 01/10/2020   ESRSEDRATE 55 (H) 12/27/2019   CRP 8.8 (H) 01/24/2020   CRP 35.9 (H) 01/10/2020   CRP 77.9 (H) 12/27/2019   REPTSTATUS 05/08/2023 FINAL 05/05/2023   GRAMSTAIN NO WBC SEEN NO ORGANISMS SEEN  05/05/2023   CULT  05/05/2023    NO GROWTH 3 DAYS Performed at [facility], [address] [place], [place] [address]    LABORGA PROTEUS MIRABILIS 05/17/2022   LABORGA ENTEROCOCCUS FAECALIS 05/17/2022   LABORGA  STAPHYLOCOCCUS AUREUS 05/17/2022     Lab Results  Component Value Date   ALBUMIN  3.3 (L) 09/11/2023   ALBUMIN  2.6 (L) 05/06/2023   ALBUMIN  4.5 05/04/2023   PREALBUMIN 32.7 05/09/2021   PREALBUMIN 14.8 (L) 12/14/2018    Lab Results  Component Value Date   MG 1.7 05/05/2023   MG 2.0 07/25/2022   MG 1.6 (L) 07/23/2022   Lab Results  Component Value Date   VD25OH 42.0 11/19/2021   VD25OH 16.13 (L) 05/09/2021    Lab Results  Component Value Date   PREALBUMIN 32.7 05/09/2021   PREALBUMIN 14.8 (L) 12/14/2018      Latest Ref Rng & Units 09/11/2023    8:15 PM 05/06/2023    5:57 AM 05/04/2023    4:20 PM  CBC EXTENDED  WBC 4.0 - 10.5 K/uL 5.1  5.9  9.4   RBC 4.22 - 5.81 MIL/uL 4.20  2.96  3.87   Hemoglobin 13.0 - 17.0 g/dL 88.9  8.4  89.3   HCT 60.9 - 52.0 % 35.9  26.1  34.1   Platelets 150 - 400 K/uL 112  82  119   NEUT# 1.7 - 7.7 K/uL 3.9   8.3   Lymph# 0.7 - 4.0 K/uL 0.4   0.3      There is no height or  weight on file to calculate BMI.  Orders:  No orders of the defined types were placed in this encounter.  No orders of the defined types were placed in this encounter.    Procedures: No procedures performed  Clinical Data: No additional findings.  ROS:  All other systems negative, except as noted in the HPI. Review of Systems  Objective: Vital Signs: There were no vitals taken for this visit.  Specialty Comments:  No specialty comments available.  PMFS History: Patient Active Problem List   Diagnosis Date Noted   Acute respiratory failure with hypoxia (HCC) 05/05/2023   Ascites 05/05/2023   Hyponatremia 05/05/2023   Right below-knee amputee (HCC) 07/29/2022   Sepsis due to pneumonia (HCC) 07/23/2022   Foot infection 07/22/2022   Acute respiratory failure (HCC) 07/22/2022   Anemia due to chronic blood loss 06/23/2022   Heme positive stool 06/23/2022   Benign neoplasm of cecum 06/23/2022   Benign neoplasm of transverse colon 06/23/2022   Benign  neoplasm of descending colon 06/23/2022   Abscess of right foot 05/17/2022   Contraindication to anticoagulation therapy GU Bleed 03/13/2022   Permanent atrial fibrillation (HCC) 08/18/2021   Anemia of chronic renal failure 08/18/2021   Thrombocytopenia (HCC) 08/18/2021   Acute metabolic encephalopathy 08/18/2021   Action induced myoclonus 08/18/2021   Left below-knee amputee (HCC) 05/11/2021   Anemia of chronic disease 09/13/2020   COVID-19 virus infection 09/13/2020   Pressure injury of skin 06/29/2020   Gross hematuria 06/16/2020   Typical atrial flutter (HCC) 03/24/2020   Bilateral pleural effusion 02/24/2020   Healthcare maintenance 01/28/2020   Shortness of breath 01/28/2020   History of partial ray amputation of fourth toe of right foot (HCC) 09/08/2019   Chronic cough 06/25/2019   Cutaneous abscess of right foot    Subacute osteomyelitis, right ankle and foot (HCC) 12/14/2018   AKI (acute kidney injury) (HCC) 12/14/2018   ESRD on dialysis (HCC) 12/14/2018   S/P CABG (coronary artery bypass graft) 12/11/2018   Gait abnormality 08/24/2018   Diabetic neuropathy (HCC) 02/06/2018   Cervical myelopathy (HCC) 02/06/2018   Onychomycosis 10/30/2017   Other spondylosis with radiculopathy, cervical region 01/27/2017   Midfoot ulcer, right, limited to breakdown of skin (HCC) 11/15/2016   Lateral epicondylitis, left elbow 08/12/2016   Prostate cancer (HCC) 06/19/2016   Cellulitis of right foot 12/24/2015   Gout 09/14/2012   Essential hypertension 09/14/2012   Hypothyroidism 09/14/2012   CAD (coronary artery disease) 09/14/2012   Insulin  dependent type 2 diabetes mellitus (HCC) 09/14/2012   Hyperlipidemia associated with type 2 diabetes mellitus (HCC)    Neuropathy (HCC)    Past Medical History:  Diagnosis Date   Acute osteomyelitis of metatarsal bone of left foot (HCC)    Ambulates with cane    straight cane   Anemia    Cervical myelopathy (HCC) 02/06/2018   Chronic kidney  disease    dailysis M W F- home   Community acquired pneumonia of left lower lobe of lung 08/18/2021   Complication of anesthesia    Coronary artery disease    Dehiscence of amputation stump of left lower extremity (HCC)    Diabetes mellitus without complication (HCC)    type2   Diabetic foot ulcer (HCC)    Diabetic neuropathy (HCC) 02/06/2018   Dysrhythmia    Gait abnormality 08/24/2018   GERD (gastroesophageal reflux disease)     01/06/20- not current   Gout    History of blood transfusion    History  of blood transfusion    History of kidney stones    passed stones   Hypercholesteremia    Hypertension    Hypothyroidism    Neuromuscular disorder (HCC)    neuropathy left leg and bilateral feet   Neuropathy    Partial nontraumatic amputation of foot, 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   PONV (postoperative nausea and vomiting)    Prostate cancer (HCC)    PVD (peripheral vascular disease) (HCC)    with amputations    Family History  Problem Relation Age of Onset   Diabetes Mellitus II Mother    Kidney disease Mother    Diabetes Mellitus II Father    CAD Father    Cancer Father        prostate   Kidney disease Father    Diabetes Mellitus II Brother    Kidney disease Brother    Diabetes Mellitus II Brother    Stomach cancer Brother 39   Kidney disease Brother    Colon cancer Neg Hx    Colon polyps Neg Hx    Esophageal cancer Neg Hx    Rectal cancer Neg Hx    Pancreatic cancer Neg Hx     Past Surgical History:  Procedure Laterality Date   A-FLUTTER ABLATION N/A 04/06/2020   Procedure: A-FLUTTER ABLATION;  Surgeon: Waddell Danelle ORN, MD;  Location: [facility] INVASIVE CV LAB;  Service: Cardiovascular;  Laterality: N/A;   A/V FISTULAGRAM N/A 03/05/2023   Procedure: A/V Fistulagram;  Surgeon: Pearline [name] RAMAN, MD;  Location: [facility] INVASIVE CV LAB;  Service: Vascular;  Laterality: N/A;   AMPUTATION Left 12/25/2013   Procedure: AMPUTATION RAY LEFT 5TH RAY;  Surgeon: Jerona Harden GAILS, MD;  Location:  [facility] ORS;  Service: Orthopedics;  Laterality: Left;   AMPUTATION Right 12/15/2018   Procedure: AMPUTATION OF 4TH AND 5TH TOES RIGHT FOOT;  Surgeon: Harden Jerona GAILS, MD;  Location: [facility] OR;  Service: Orthopedics;  Laterality: Right;   AMPUTATION Left 02/02/2021   Procedure: LEFT FOOT 4TH RAY AMPUTATION;  Surgeon: Harden Jerona GAILS, MD;  Location: [facility] OR;  Service: Orthopedics;  Laterality: Left;   AMPUTATION Left 05/09/2021   Procedure: LEFT BELOW KNEE AMPUTATION;  Surgeon: Harden Jerona GAILS, MD;  Location: [facility] OR;  Service: Orthopedics;  Laterality: Left;   AMPUTATION Right 07/26/2022   Procedure: RIGHT BELOW KNEE AMPUTATION;  Surgeon: Harden Jerona GAILS, MD;  Location: [facility] OR;  Service: Orthopedics;  Laterality: Right;   APPLICATION OF WOUND VAC Right 12/15/2018   Procedure: APPLICATION OF WOUND VAC;  Surgeon: Harden Jerona GAILS, MD;  Location: [facility] OR;  Service: Orthopedics;  Laterality: Right;   AV FISTULA PLACEMENT Left 03/25/2019   Procedure: LEFT ARM ARTERIOVENOUS (AV) FISTULA CREATION;  Surgeon: Serene Gaile ORN, MD;  Location: [facility] OR;  Service: Vascular;  Laterality: Left;   BACK SURGERY     BASCILIC VEIN TRANSPOSITION Left 05/20/2019   Procedure: SECOND STAGE LEFT BASCILIC VEIN TRANSPOSITION;  Surgeon: Serene Gaile ORN, MD;  Location: [facility] OR;  Service: Vascular;  Laterality: Left;   BIOPSY  06/22/2022   Procedure: BIOPSY;  Surgeon: Wilhelmenia Aloha Raddle., MD;  Location: [facility] ENDOSCOPY;  Service: Gastroenterology;;   CARDIAC CATHETERIZATION  02/17/2014   CATARACT EXTRACTION Bilateral    CHOLECYSTECTOMY     COLONOSCOPY  2011   in [place], Normal   COLONOSCOPY N/A 06/23/2022   Procedure: COLONOSCOPY;  Surgeon: Legrand Victory LITTIE DOUGLAS, MD;  Location: [facility] ENDOSCOPY;  Service: Gastroenterology;  Laterality: N/A;   CORONARY ARTERY BYPASS GRAFT  2008   CYSTOSCOPY N/A 06/29/2020   Procedure: CYSTOSCOPY WITH CLOT EVACUATION AND  FULGERATION;  Surgeon: Matilda Senior, MD;  Location: [facility] OR;  Service: Urology;   Laterality: N/A;   CYSTOSCOPY WITH FULGERATION Bilateral 06/17/2020   Procedure: CYSTOSCOPY,BILATERAL RETROGRADE, CLOT EVACUATION WITH FULGERATION OF THE BLADDER;  Surgeon: Selma Donnice SAUNDERS, MD;  Location: [facility] OR;  Service: Urology;  Laterality: Bilateral;   CYSTOSCOPY WITH FULGERATION N/A 06/28/2020   Procedure: CYSTOSCOPY WITH CLOT EVACUATION AND FULGERATION OF BLEEDERS;  Surgeon: Matilda Senior, MD;  Location: [facility] OR;  Service: Urology;  Laterality: N/A;  1 HR   ENTEROSCOPY N/A 06/22/2022   Procedure: ENTEROSCOPY;  Surgeon: Wilhelmenia Aloha Raddle., MD;  Location: [facility] ENDOSCOPY;  Service: Gastroenterology;  Laterality: N/A;   HEMOSTASIS CLIP PLACEMENT  06/22/2022   Procedure: HEMOSTASIS CLIP PLACEMENT;  Surgeon: Wilhelmenia Aloha Raddle., MD;  Location: [facility] ENDOSCOPY;  Service: Gastroenterology;;   HOT HEMOSTASIS N/A 06/22/2022   Procedure: HOT HEMOSTASIS (ARGON PLASMA COAGULATION/BICAP);  Surgeon: Wilhelmenia Aloha Raddle., MD;  Location: [facility] ENDOSCOPY;  Service: Gastroenterology;  Laterality: N/A;   I & D EXTREMITY Right 12/15/2018   Procedure: DEBRIDEMENT RIGHT FOOT;  Surgeon: Harden Jerona GAILS, MD;  Location: [facility] OR;  Service: Orthopedics;  Laterality: Right;   I & D EXTREMITY Right 08/27/2019   Procedure: PARTIAL CUBOID EXCISION RIGHT FOOT;  Surgeon: Harden Jerona GAILS, MD;  Location: [facility] OR;  Service: Orthopedics;  Laterality: Right;   I & D EXTREMITY Right 01/07/2020   Procedure: RIGHT FOOT EXCISION INFECTED BONE;  Surgeon: Harden Jerona GAILS, MD;  Location: [facility] OR;  Service: Orthopedics;  Laterality: Right;   I & D EXTREMITY Right 05/17/2022   Procedure: IRRIGATION AND DEBRIDEMENT RIGHT FOOT ABSCESS;  Surgeon: Harden Jerona GAILS, MD;  Location: [facility] OR;  Service: Orthopedics;  Laterality: Right;   IR PARACENTESIS  10/11/2022   IR PARACENTESIS  12/24/2022   IR PARACENTESIS  02/06/2023   IR PARACENTESIS  05/05/2023   IR PARACENTESIS  05/19/2023   IR PARACENTESIS  07/11/2023   IR PARACENTESIS  07/30/2023   IR  PARACENTESIS  08/20/2023   IR PARACENTESIS  09/19/2023   IR PARACENTESIS  10/03/2023   NECK SURGERY     novemver 2019   PERIPHERAL VASCULAR BALLOON ANGIOPLASTY Left 03/05/2023   Procedure: PERIPHERAL VASCULAR BALLOON ANGIOPLASTY;  Surgeon: Pearline [name] RAMAN, MD;  Location: [facility] INVASIVE CV LAB;  Service: Vascular;  Laterality: Left;  AVF   POLYPECTOMY  06/22/2022   Procedure: POLYPECTOMY;  Surgeon: Mansouraty, Aloha Raddle., MD;  Location: [facility] ENDOSCOPY;  Service: Gastroenterology;;   POLYPECTOMY  06/23/2022   Procedure: POLYPECTOMY;  Surgeon: Legrand Victory LITTIE DOUGLAS, MD;  Location: [facility] ENDOSCOPY;  Service: Gastroenterology;;   STUMP REVISION Left 06/27/2021   Procedure: REVISION LEFT BELOW KNEE AMPUTATION;  Surgeon: Harden Jerona GAILS, MD;  Location: [facility] OR;  Service: Orthopedics;  Laterality: Left;   SUBMUCOSAL LIFTING INJECTION  06/22/2022   Procedure: SUBMUCOSAL LIFTING INJECTION;  Surgeon: Wilhelmenia Aloha Raddle., MD;  Location: [facility] ENDOSCOPY;  Service: Gastroenterology;;   SUBMUCOSAL TATTOO INJECTION  06/22/2022   Procedure: SUBMUCOSAL TATTOO INJECTION;  Surgeon: Wilhelmenia Aloha Raddle., MD;  Location: [facility] ENDOSCOPY;  Service: Gastroenterology;;   TRANSURETHRAL RESECTION OF PROSTATE N/A 06/28/2020   Procedure: TRANSURETHRAL RESECTION OF THE PROSTATE (TURP);  Surgeon: Matilda Senior, MD;  Location: [facility] OR;  Service: Urology;  Laterality: N/A;   WISDOM TOOTH EXTRACTION     Social History   Occupational History   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  Tobacco Use  Smoking status: Former    Types: Cigars    Start date: 04/08/1973    Quit date: 12/24/1988    Years since quitting: 34.8   Smokeless tobacco: Never   Tobacco comments:    Cigars and Pipe   Vaping Use   Vaping status: Never Used  Substance and Sexual Activity   Alcohol  use: Never   Drug use: Never   Sexual activity: Yes    Partners: Female

## 2023-10-09 ENCOUNTER — Other Ambulatory Visit: Payer: Self-pay | Admitting: Physical Medicine and Rehabilitation

## 2023-10-12 ENCOUNTER — Encounter: Payer: Self-pay | Admitting: Family Medicine

## 2023-10-12 DIAGNOSIS — K7460 Unspecified cirrhosis of liver: Secondary | ICD-10-CM

## 2023-10-15 ENCOUNTER — Telehealth: Payer: Self-pay

## 2023-10-15 NOTE — Telephone Encounter (Signed)
 Faxed ct over

## 2023-10-15 NOTE — Telephone Encounter (Signed)
 Copied from CRM [id number]. Topic: Clinical - Lab/Test Results >> Oct 15, 2023  8:53 AM Robinson H wrote: Reason for CRM: [facility] calling to obtain ultrasound results for patient, states they've received the referral but can't schedule patient until they see Ultrasound results.  [facility] [phone] fax

## 2023-10-17 ENCOUNTER — Ambulatory Visit (HOSPITAL_COMMUNITY)
Admission: RE | Admit: 2023-10-17 | Discharge: 2023-10-17 | Disposition: A | Discharge status: Home / Self Care | Source: Ambulatory Visit | Attending: Nephrology | Admitting: Nephrology

## 2023-10-17 DIAGNOSIS — N186 End stage renal disease: Secondary | ICD-10-CM | POA: Insufficient documentation

## 2023-10-17 DIAGNOSIS — R188 Other ascites: Secondary | ICD-10-CM | POA: Diagnosis not present

## 2023-10-17 HISTORY — PX: IR PARACENTESIS: IMG2679

## 2023-10-17 MED ORDER — LIDOCAINE HCL 1 % IJ SOLN
INTRAMUSCULAR | Status: AC
Start: 2023-10-17 — End: 2023-10-17
  Filled 2023-10-17: qty 20

## 2023-10-17 MED ORDER — LIDOCAINE HCL (PF) 1 % IJ SOLN: 10.0000 mL | Freq: Once | INTRAMUSCULAR | Status: DC

## 2023-10-17 NOTE — Procedures (Signed)
 PROCEDURE SUMMARY:  Successful image-guided paracentesis from the left lower abdomen.  Yielded 6.5 liters of clear, yellow fluid.  No immediate complications.  EBL = trace. Patient tolerated well.   Please see imaging section of Epic for full dictation.   Lavanda JAYSON Jurist PA-C 10/17/2023 1:26 PM

## 2023-10-23 ENCOUNTER — Telehealth: Payer: Self-pay | Admitting: Family Medicine

## 2023-10-23 NOTE — Telephone Encounter (Signed)
 Copied from CRM [id number]. Topic: General - Call Back - No Documentation >> Oct 23, 2023  4:24 PM Rea BROCKS wrote: Reason for CRM: Dawn NP at [employer] returned a phone call to Belle. Her contact is [phone] and she stated she is available until 7pm.

## 2023-10-28 ENCOUNTER — Emergency Department (HOSPITAL_BASED_OUTPATIENT_CLINIC_OR_DEPARTMENT_OTHER)

## 2023-10-28 ENCOUNTER — Encounter (HOSPITAL_BASED_OUTPATIENT_CLINIC_OR_DEPARTMENT_OTHER): Payer: Self-pay | Admitting: Emergency Medicine

## 2023-10-28 ENCOUNTER — Inpatient Hospital Stay (HOSPITAL_BASED_OUTPATIENT_CLINIC_OR_DEPARTMENT_OTHER)
Admission: EM | Admit: 2023-10-28 | Discharge: 2023-10-31 | DRG: 640 | Disposition: A | Discharge status: Home / Self Care | Attending: Internal Medicine | Admitting: Internal Medicine

## 2023-10-28 ENCOUNTER — Emergency Department (HOSPITAL_BASED_OUTPATIENT_CLINIC_OR_DEPARTMENT_OTHER): Admitting: Radiology

## 2023-10-28 ENCOUNTER — Other Ambulatory Visit: Payer: Self-pay

## 2023-10-28 DIAGNOSIS — Z79899 Other long term (current) drug therapy: Secondary | ICD-10-CM

## 2023-10-28 DIAGNOSIS — Z6830 Body mass index (BMI) 30.0-30.9, adult: Secondary | ICD-10-CM

## 2023-10-28 DIAGNOSIS — M109 Gout, unspecified: Secondary | ICD-10-CM | POA: Diagnosis present

## 2023-10-28 DIAGNOSIS — E1151 Type 2 diabetes mellitus with diabetic peripheral angiopathy without gangrene: Secondary | ICD-10-CM | POA: Diagnosis present

## 2023-10-28 DIAGNOSIS — E8770 Fluid overload, unspecified: Secondary | ICD-10-CM | POA: Diagnosis not present

## 2023-10-28 DIAGNOSIS — Z885 Allergy status to narcotic agent status: Secondary | ICD-10-CM

## 2023-10-28 DIAGNOSIS — Z841 Family history of disorders of kidney and ureter: Secondary | ICD-10-CM

## 2023-10-28 DIAGNOSIS — N186 End stage renal disease: Secondary | ICD-10-CM | POA: Diagnosis present

## 2023-10-28 DIAGNOSIS — Z7985 Long-term (current) use of injectable non-insulin antidiabetic drugs: Secondary | ICD-10-CM

## 2023-10-28 DIAGNOSIS — J189 Pneumonia, unspecified organism: Principal | ICD-10-CM | POA: Diagnosis present

## 2023-10-28 DIAGNOSIS — E039 Hypothyroidism, unspecified: Secondary | ICD-10-CM | POA: Diagnosis present

## 2023-10-28 DIAGNOSIS — D631 Anemia in chronic kidney disease: Secondary | ICD-10-CM | POA: Diagnosis present

## 2023-10-28 DIAGNOSIS — Z79890 Hormone replacement therapy (postmenopausal): Secondary | ICD-10-CM

## 2023-10-28 DIAGNOSIS — Z833 Family history of diabetes mellitus: Secondary | ICD-10-CM

## 2023-10-28 DIAGNOSIS — Z91018 Allergy to other foods: Secondary | ICD-10-CM

## 2023-10-28 DIAGNOSIS — E66811 Obesity, class 1: Secondary | ICD-10-CM | POA: Diagnosis present

## 2023-10-28 DIAGNOSIS — J9601 Acute respiratory failure with hypoxia: Secondary | ICD-10-CM | POA: Diagnosis present

## 2023-10-28 DIAGNOSIS — Z951 Presence of aortocoronary bypass graft: Secondary | ICD-10-CM

## 2023-10-28 DIAGNOSIS — I120 Hypertensive chronic kidney disease with stage 5 chronic kidney disease or end stage renal disease: Secondary | ICD-10-CM | POA: Diagnosis present

## 2023-10-28 DIAGNOSIS — Z992 Dependence on renal dialysis: Secondary | ICD-10-CM

## 2023-10-28 DIAGNOSIS — E7800 Pure hypercholesterolemia, unspecified: Secondary | ICD-10-CM | POA: Diagnosis present

## 2023-10-28 DIAGNOSIS — I2510 Atherosclerotic heart disease of native coronary artery without angina pectoris: Secondary | ICD-10-CM | POA: Diagnosis present

## 2023-10-28 DIAGNOSIS — Z9079 Acquired absence of other genital organ(s): Secondary | ICD-10-CM

## 2023-10-28 DIAGNOSIS — Z87891 Personal history of nicotine dependence: Secondary | ICD-10-CM

## 2023-10-28 DIAGNOSIS — D696 Thrombocytopenia, unspecified: Secondary | ICD-10-CM | POA: Diagnosis present

## 2023-10-28 DIAGNOSIS — Z800 Family history of malignant neoplasm of digestive organs: Secondary | ICD-10-CM

## 2023-10-28 DIAGNOSIS — Z1152 Encounter for screening for COVID-19: Secondary | ICD-10-CM

## 2023-10-28 DIAGNOSIS — E1122 Type 2 diabetes mellitus with diabetic chronic kidney disease: Secondary | ICD-10-CM | POA: Diagnosis present

## 2023-10-28 DIAGNOSIS — Z89511 Acquired absence of right leg below knee: Secondary | ICD-10-CM

## 2023-10-28 DIAGNOSIS — Z8546 Personal history of malignant neoplasm of prostate: Secondary | ICD-10-CM

## 2023-10-28 DIAGNOSIS — R0602 Shortness of breath: Secondary | ICD-10-CM | POA: Diagnosis not present

## 2023-10-28 DIAGNOSIS — I871 Compression of vein: Secondary | ICD-10-CM | POA: Diagnosis present

## 2023-10-28 DIAGNOSIS — R531 Weakness: Secondary | ICD-10-CM

## 2023-10-28 DIAGNOSIS — I4821 Permanent atrial fibrillation: Secondary | ICD-10-CM | POA: Diagnosis present

## 2023-10-28 DIAGNOSIS — Z89512 Acquired absence of left leg below knee: Secondary | ICD-10-CM

## 2023-10-28 DIAGNOSIS — Z8249 Family history of ischemic heart disease and other diseases of the circulatory system: Secondary | ICD-10-CM

## 2023-10-28 DIAGNOSIS — K219 Gastro-esophageal reflux disease without esophagitis: Secondary | ICD-10-CM | POA: Diagnosis present

## 2023-10-28 LAB — RESP PANEL BY RT-PCR (RSV, FLU A&B, COVID)  RVPGX2
Influenza A by PCR: NEGATIVE
Influenza B by PCR: NEGATIVE
Resp Syncytial Virus by PCR: NEGATIVE
SARS Coronavirus 2 by RT PCR: NEGATIVE

## 2023-10-28 LAB — CBC
HCT: 39.5 % (ref 39.0–52.0)
Hemoglobin: 11.9 g/dL — ABNORMAL LOW (ref 13.0–17.0)
MCH: 26 pg (ref 26.0–34.0)
MCHC: 30.1 g/dL (ref 30.0–36.0)
MCV: 86.4 fL (ref 80.0–100.0)
Platelets: 122 K/uL — ABNORMAL LOW (ref 150–400)
RBC: 4.57 MIL/uL (ref 4.22–5.81)
RDW: 18.7 % — ABNORMAL HIGH (ref 11.5–15.5)
WBC: 8.7 K/uL (ref 4.0–10.5)
nRBC: 0 % (ref 0.0–0.2)

## 2023-10-28 LAB — BASIC METABOLIC PANEL WITH GFR
Anion gap: 16 — ABNORMAL HIGH (ref 5–15)
BUN: 31 mg/dL — ABNORMAL HIGH (ref 8–23)
CO2: 24 mmol/L (ref 22–32)
Calcium: 8.8 mg/dL — ABNORMAL LOW (ref 8.9–10.3)
Chloride: 98 mmol/L (ref 98–111)
Creatinine, Ser: 4.56 mg/dL — ABNORMAL HIGH (ref 0.61–1.24)
GFR, Estimated: 13 mL/min — ABNORMAL LOW (ref >60)
Glucose, Bld: 97 mg/dL (ref 70–99)
Potassium: 5.1 mmol/L (ref 3.5–5.1)
Sodium: 139 mmol/L (ref 135–145)

## 2023-10-28 LAB — PRO BRAIN NATRIURETIC PEPTIDE: Pro Brain Natriuretic Peptide: >35000 pg/mL — ABNORMAL HIGH (ref <300.0)

## 2023-10-28 LAB — LACTIC ACID, PLASMA: Lactic Acid, Venous: 1.9 mmol/L (ref 0.5–1.9)

## 2023-10-28 MED ORDER — IPRATROPIUM-ALBUTEROL 0.5-2.5 (3) MG/3ML IN SOLN
3.0000 mL | Freq: Once | RESPIRATORY_TRACT | Status: AC
  Administered 2023-10-28: 3 mL via RESPIRATORY_TRACT
  Filled 2023-10-28: qty 3

## 2023-10-28 MED ORDER — DOXYCYCLINE HYCLATE 100 MG PO TABS
100.0000 mg | ORAL_TABLET | Freq: Once | ORAL | Status: AC
  Administered 2023-10-28: 100 mg via ORAL
  Filled 2023-10-28: qty 1

## 2023-10-28 MED ORDER — SODIUM CHLORIDE 0.9 % IV SOLN
2.0000 g | Freq: Once | INTRAVENOUS | Status: AC
  Administered 2023-10-28: 2 g via INTRAVENOUS
  Filled 2023-10-28: qty 20

## 2023-10-28 NOTE — ED Triage Notes (Signed)
 Cough started last night Non productive Sob Dialysis patient

## 2023-10-28 NOTE — ED Provider Notes (Signed)
 [place] EMERGENCY DEPARTMENT AT [facility] Provider Note   CSN: [id number] Arrival date & time: 10/28/23  2014     Patient presents with: Cough   [name] is a 73 y.o. male.   73 year old male with past medical history of end-stage renal disease on dialysis Tuesdays, Thursdays, and Saturdays as well as osteomyelitis and diabetes presenting to the emergency department today with cough and shortness of breath.  This has been going on now for the past 24 hours or so.  The patient states that he has been feeling short of breath with a cough during this time.  Denies any associated chest pain.  He denies any hemoptysis.  He states that this feels similar to when he has had pneumonia in the past.  Denies any history of asthma or COPD.  He came to the ER today for further evaluation regarding this states due to ongoing symptoms.   Cough Associated symptoms: shortness of breath        Prior to Admission medications   Medication Sig Start Date End Date Taking? Authorizing Provider  acetaminophen  (TYLENOL ) 325 MG tablet Take 2 tablets (650 mg total) by mouth every 4 (four) hours as needed for fever or mild pain. 08/08/22   Angiulli, Toribio PARAS, PA-C  albuterol  (VENTOLIN  HFA) 108 (90 Base) MCG/ACT inhaler Inhale 2 puffs into the lungs every 6 (six) hours as needed for shortness of breath or wheezing. 05/13/23 11/09/23  [provider]  allopurinol  (ZYLOPRIM ) 100 MG tablet Take 2 tablets (200 mg total) by mouth daily. 08/08/22   Angiulli, Toribio PARAS, PA-C  calcitRIOL  (ROCALTROL ) 0.5 MCG capsule Take 0.5 mcg by mouth 3 (three) times a week. 02/17/23   [provider]  doxycycline  (VIBRAMYCIN ) 100 MG capsule Take 1 capsule (100 mg total) by mouth 2 (two) times daily. One po bid x 7 days 09/12/23   Geroldine Berg, MD  levothyroxine  (SYNTHROID ) 112 MCG tablet Take 1 tablet (112 mcg total) by mouth daily before breakfast. 05/22/21   Angiulli, Toribio PARAS, PA-C  Methoxy PEG-Epoetin Beta  (MIRCERA IJ) Inject into the skin. 08/20/22   [provider]  modafinil  (PROVIGIL ) 100 MG tablet Take 1 tablet (100 mg total) by mouth daily. 10/06/23   Lovorn, Megan, MD  modafinil  (PROVIGIL ) 200 MG tablet Take 1 tablet by mouth once daily 10/12/23   Raulkar, Krutika P, MD  OZEMPIC , 0.25 OR 0.5 MG/DOSE, 2 MG/3ML SOPN Inject 0.25 mg into the skin once a week. 09/19/21   [provider]  pantoprazole  (PROTONIX ) 40 MG tablet Take 1 tablet (40 mg total) by mouth daily. 09/15/23   [name], NP  pregabalin  (LYRICA ) 150 MG capsule Take 1 capsule (150 mg total) by mouth 3 (three) times daily. 09/15/23   [name], NP  propranolol  (INDERAL ) 40 MG tablet Take 40 mg by mouth daily. 03/24/23   [provider]  rosuvastatin  (CRESTOR ) 10 MG tablet Take 1 tablet (10 mg total) by mouth daily. 08/08/22   Angiulli, Toribio PARAS, PA-C  sevelamer  carbonate (RENVELA ) 800 MG tablet Take 800 mg by mouth 3 (three) times daily. 01/17/23   [provider]  torsemide  (DEMADEX ) 20 MG tablet Take 1 tablet (20 mg total) by mouth 2 (two) times daily. Patient not taking: Reported on 09/15/2023 06/16/23   Ladona Heinz, MD    Allergies: Morphine  and Mushroom extract complex (obsolete)    Review of Systems  Respiratory:  Positive for cough and shortness of breath.  All other systems reviewed and are negative.   Updated Vital Signs BP (!) 145/62   Pulse 81   Temp 98 F (36.7 C) (Oral)   Resp (!) 21   SpO2 96%   Physical Exam Vitals and nursing note reviewed.   Gen: Coughing intermittently, speaking in 3-4 word sentences Eyes: PERRL, EOMI HEENT: no oropharyngeal swelling Neck: trachea midline Resp: Coarse breath sounds at bilateral lung bases that clear with coughing Card: RRR, no murmurs, rubs, or gallops Abd: nontender, moderately distended Extremities: Lower extremity surgically absent Skin: no rashes Psyc: acting appropriately   (all labs ordered are listed, but  only abnormal results are displayed) Labs Reviewed  BASIC METABOLIC PANEL WITH GFR - Abnormal; Notable for the following components:      Result Value   BUN 31 (*)    Creatinine, Ser 4.56 (*)    Calcium  8.8 (*)    GFR, Estimated 13 (*)    Anion gap 16 (*)    All other components within normal limits  CBC - Abnormal; Notable for the following components:   Hemoglobin 11.9 (*)    RDW 18.7 (*)    Platelets 122 (*)    All other components within normal limits  PRO BRAIN NATRIURETIC PEPTIDE - Abnormal; Notable for the following components:   Pro Brain Natriuretic Peptide >35,000.0 (*)    All other components within normal limits  RESP PANEL BY RT-PCR (RSV, FLU A&B, COVID)  RVPGX2  CULTURE, BLOOD (ROUTINE X 2)  CULTURE, BLOOD (ROUTINE X 2)  LACTIC ACID, PLASMA    EKG: EKG Interpretation Date/Time:  Tuesday October 28 2023 20:39:39 EDT Ventricular Rate:  99 PR Interval:    QRS Duration:  103 QT Interval:  367 QTC Calculation: 471 R Axis:   127  Text Interpretation: Atrial fibrillation Right axis deviation Low voltage, precordial leads Baseline wander in lead(s) V1 Confirmed by Ula Barter [id number]) on 10/28/2023 8:51:13 PM  Radiology: DG Chest 2 View Result Date: 10/28/2023 CLINICAL DATA:  Shortness of breath and cough EXAM: CHEST - 2 VIEW COMPARISON:  10/28/2023 FINDINGS: Post sternotomy changes. Patchy airspace disease at the bases. Normal cardiac size. No pneumothorax. Hardware in the cervicothoracic spine IMPRESSION: Hypoventilatory changes with patchy airspace opacities at the bases either representing atelectasis or pneumonia. Electronically Signed   By: Luke Bun M.D.   On: 10/28/2023 22:56   DG Chest Port 1 View Result Date: 10/28/2023 CLINICAL DATA:  sob EXAM: PORTABLE CHEST 1 VIEW COMPARISON:  CT chest 05/04/2023 FINDINGS: The heart and mediastinal contours are within normal limits. Low lung volumes with left lower mid lung zone airspace opacities. No pulmonary edema. No  pleural effusion. No pneumothorax. No acute osseous abnormality.  Sternotomy wires are intact. IMPRESSION: Low lung volumes with left lower mid lung zone airspace opacities. Recommend repeat chest x-ray PA and lateral view with improved inspiratory effort for further evaluation Electronically Signed   By: Morgane  Naveau M.D.   On: 10/28/2023 21:05     Procedures   Medications Ordered in the ED  cefTRIAXone  (ROCEPHIN ) 2 g in sodium chloride  0.9 % 100 mL IVPB (2 g Intravenous New Bag/Given 10/28/23 2320)  ipratropium-albuterol  (DUONEB) 0.5-2.5 (3) MG/3ML nebulizer solution 3 mL (3 mLs Nebulization Given 10/28/23 2049)  doxycycline  (VIBRA -TABS) tablet 100 mg (100 mg Oral Given 10/28/23 2317)  Medical Decision Making 73 year old male with past medical history of end-stage renal disease, diabetes, hypertension, hyperlipidemia presenting to the emergency department today with cough and shortness of breath.  I will further evaluate the patient here with basic lab as well as an EKG, chest x-ray to evaluate for pulmonary edema, pulmonary infiltrates, pneumothorax.  Will also obtain blood cultures and lactic acid on the patient.  Will give him a DuoNeb here and obtain an RSV/COVID/flu swab to evaluate for viral etiologies.  I will reevaluate for ultimate disposition.  The patient's chest x-ray shows findings concerning for pneumonia.  The remainder of his labs are largely unremarkable.  His BNP is significantly elevated but suspect that this is chronic given his end-stage renal disease.  The patient is covered with antibiotics.  After the nebulizer treatment we did try to ambulate the patient.  He did not desat but was only able to walk a few feet due to severe shortness of breath and generalized weakness.  Calls placed to hospital service for admission.  Amount and/or Complexity of Data Reviewed Labs: ordered. Radiology: ordered.  Risk Prescription drug  management. Decision regarding hospitalization.        Final diagnoses:  Pneumonia due to infectious organism, unspecified laterality, unspecified part of lung  Generalized weakness    ED Discharge Orders     None          Ula Prentice SAUNDERS, MD 10/28/23 2328

## 2023-10-28 NOTE — ED Notes (Signed)
 Pt is bilateral BKA. Uses bilateral prosthetic legs and rolling walker at baseline for ambulation. Is currently in physical therapy.  Pt assisted with ambulation by this RN, NT Warren, and RT [employer]. Pt was able to sit up and swing legs around. Required the assistance of two staff members to be able to sit on the edge of the bed. Pt reports feeling increasingly weak. Wife at bedside sts that this is no his normal. After sitting on the edge of the bed pt also required the assistance of all three staff members to stand. Pt increasingly unsteady while standing. Pt became tachy a Fib 100-110s. No notable hypoxia - remained 94-98% on room air. Coughing spells continue. Primary RN Marinell informed of findings.

## 2023-10-29 DIAGNOSIS — Z89512 Acquired absence of left leg below knee: Secondary | ICD-10-CM | POA: Diagnosis not present

## 2023-10-29 DIAGNOSIS — N186 End stage renal disease: Secondary | ICD-10-CM

## 2023-10-29 DIAGNOSIS — Z7985 Long-term (current) use of injectable non-insulin antidiabetic drugs: Secondary | ICD-10-CM | POA: Diagnosis not present

## 2023-10-29 DIAGNOSIS — Z6830 Body mass index (BMI) 30.0-30.9, adult: Secondary | ICD-10-CM | POA: Diagnosis not present

## 2023-10-29 DIAGNOSIS — R2232 Localized swelling, mass and lump, left upper limb: Secondary | ICD-10-CM | POA: Diagnosis not present

## 2023-10-29 DIAGNOSIS — Z833 Family history of diabetes mellitus: Secondary | ICD-10-CM | POA: Diagnosis not present

## 2023-10-29 DIAGNOSIS — Z951 Presence of aortocoronary bypass graft: Secondary | ICD-10-CM | POA: Diagnosis not present

## 2023-10-29 DIAGNOSIS — E1151 Type 2 diabetes mellitus with diabetic peripheral angiopathy without gangrene: Secondary | ICD-10-CM | POA: Diagnosis present

## 2023-10-29 DIAGNOSIS — E039 Hypothyroidism, unspecified: Secondary | ICD-10-CM | POA: Diagnosis present

## 2023-10-29 DIAGNOSIS — J189 Pneumonia, unspecified organism: Secondary | ICD-10-CM | POA: Diagnosis present

## 2023-10-29 DIAGNOSIS — J9601 Acute respiratory failure with hypoxia: Secondary | ICD-10-CM

## 2023-10-29 DIAGNOSIS — Z89511 Acquired absence of right leg below knee: Secondary | ICD-10-CM | POA: Diagnosis not present

## 2023-10-29 DIAGNOSIS — I4821 Permanent atrial fibrillation: Secondary | ICD-10-CM | POA: Diagnosis present

## 2023-10-29 DIAGNOSIS — Z992 Dependence on renal dialysis: Secondary | ICD-10-CM

## 2023-10-29 DIAGNOSIS — Z9889 Other specified postprocedural states: Secondary | ICD-10-CM | POA: Diagnosis not present

## 2023-10-29 DIAGNOSIS — D631 Anemia in chronic kidney disease: Secondary | ICD-10-CM | POA: Diagnosis present

## 2023-10-29 DIAGNOSIS — E8770 Fluid overload, unspecified: Secondary | ICD-10-CM

## 2023-10-29 DIAGNOSIS — D696 Thrombocytopenia, unspecified: Secondary | ICD-10-CM | POA: Diagnosis present

## 2023-10-29 DIAGNOSIS — I871 Compression of vein: Secondary | ICD-10-CM | POA: Diagnosis present

## 2023-10-29 DIAGNOSIS — R059 Cough, unspecified: Secondary | ICD-10-CM | POA: Diagnosis not present

## 2023-10-29 DIAGNOSIS — Z8249 Family history of ischemic heart disease and other diseases of the circulatory system: Secondary | ICD-10-CM | POA: Diagnosis not present

## 2023-10-29 DIAGNOSIS — I2510 Atherosclerotic heart disease of native coronary artery without angina pectoris: Secondary | ICD-10-CM | POA: Diagnosis present

## 2023-10-29 DIAGNOSIS — Z79899 Other long term (current) drug therapy: Secondary | ICD-10-CM

## 2023-10-29 DIAGNOSIS — E1122 Type 2 diabetes mellitus with diabetic chronic kidney disease: Secondary | ICD-10-CM | POA: Diagnosis present

## 2023-10-29 DIAGNOSIS — I120 Hypertensive chronic kidney disease with stage 5 chronic kidney disease or end stage renal disease: Secondary | ICD-10-CM | POA: Diagnosis present

## 2023-10-29 DIAGNOSIS — Z79890 Hormone replacement therapy (postmenopausal): Secondary | ICD-10-CM | POA: Diagnosis not present

## 2023-10-29 DIAGNOSIS — Z1152 Encounter for screening for COVID-19: Secondary | ICD-10-CM | POA: Diagnosis not present

## 2023-10-29 DIAGNOSIS — E7800 Pure hypercholesterolemia, unspecified: Secondary | ICD-10-CM | POA: Diagnosis present

## 2023-10-29 DIAGNOSIS — Z87891 Personal history of nicotine dependence: Secondary | ICD-10-CM

## 2023-10-29 DIAGNOSIS — R0602 Shortness of breath: Secondary | ICD-10-CM | POA: Diagnosis present

## 2023-10-29 DIAGNOSIS — Z95828 Presence of other vascular implants and grafts: Secondary | ICD-10-CM

## 2023-10-29 LAB — GLUCOSE, CAPILLARY
Glucose-Capillary: 82 mg/dL (ref 70–99)
Glucose-Capillary: 75 mg/dL (ref 70–99)
Glucose-Capillary: 147 mg/dL — ABNORMAL HIGH (ref 70–99)

## 2023-10-29 LAB — HEMOGLOBIN A1C
Hgb A1c MFr Bld: 4.9 % (ref 4.8–5.6)
Mean Plasma Glucose: 93.93 mg/dL

## 2023-10-29 LAB — PROCALCITONIN: Procalcitonin: 0.65 ng/mL

## 2023-10-29 LAB — HEPATITIS B SURFACE ANTIGEN: Hepatitis B Surface Ag: NONREACTIVE

## 2023-10-29 LAB — BRAIN NATRIURETIC PEPTIDE: B Natriuretic Peptide: 3272.3 pg/mL — ABNORMAL HIGH (ref 0.0–100.0)

## 2023-10-29 MED ORDER — PANTOPRAZOLE SODIUM 40 MG PO TBEC
40.0000 mg | DELAYED_RELEASE_TABLET | Freq: Every day | ORAL | Status: DC
  Administered 2023-10-29 – 2023-10-31 (×3): 40 mg via ORAL
  Filled 2023-10-29 (×3): qty 1

## 2023-10-29 MED ORDER — INSULIN ASPART 100 UNIT/ML IJ SOLN: 0.0000 [IU] | Freq: Three times a day (TID) | INTRAMUSCULAR | Status: DC

## 2023-10-29 MED ORDER — CALCITRIOL 0.5 MCG PO CAPS
0.5000 ug | ORAL_CAPSULE | ORAL | Status: DC
  Administered 2023-10-29 – 2023-10-31 (×2): 0.5 ug via ORAL
  Filled 2023-10-29 (×2): qty 1

## 2023-10-29 MED ORDER — CHLORHEXIDINE GLUCONATE CLOTH 2 % EX PADS
6.0000 | MEDICATED_PAD | Freq: Every day | CUTANEOUS | Status: DC
  Administered 2023-10-30: 6 via TOPICAL

## 2023-10-29 MED ORDER — MODAFINIL 100 MG PO TABS: 100.0000 mg | ORAL_TABLET | Freq: Every day | ORAL | Status: DC

## 2023-10-29 MED ORDER — INSULIN ASPART 100 UNIT/ML IJ SOLN: 0.0000 [IU] | Freq: Every day | INTRAMUSCULAR | Status: DC

## 2023-10-29 MED ORDER — DOXYCYCLINE HYCLATE 100 MG PO TABS
100.0000 mg | ORAL_TABLET | Freq: Two times a day (BID) | ORAL | Status: DC
  Administered 2023-10-29 – 2023-10-31 (×5): 100 mg via ORAL
  Filled 2023-10-29 (×5): qty 1

## 2023-10-29 MED ORDER — ALLOPURINOL 100 MG PO TABS
200.0000 mg | ORAL_TABLET | Freq: Every day | ORAL | Status: DC
  Administered 2023-10-29 – 2023-10-31 (×3): 200 mg via ORAL
  Filled 2023-10-29 (×3): qty 2

## 2023-10-29 MED ORDER — PROPRANOLOL HCL 40 MG PO TABS
40.0000 mg | ORAL_TABLET | Freq: Every day | ORAL | Status: DC
  Administered 2023-10-30: 40 mg via ORAL
  Filled 2023-10-29 (×4): qty 1

## 2023-10-29 MED ORDER — LIDOCAINE-PRILOCAINE 2.5-2.5 % EX CREA: 1.0000 | TOPICAL_CREAM | CUTANEOUS | Status: DC | PRN

## 2023-10-29 MED ORDER — HEPARIN SODIUM (PORCINE) 5000 UNIT/ML IJ SOLN
5000.0000 [IU] | Freq: Three times a day (TID) | INTRAMUSCULAR | Status: DC
  Administered 2023-10-29 – 2023-10-30 (×2): 5000 [IU] via SUBCUTANEOUS
  Filled 2023-10-29 (×3): qty 1

## 2023-10-29 MED ORDER — PENTAFLUOROPROP-TETRAFLUOROETH EX AERO: 1.0000 | INHALATION_SPRAY | CUTANEOUS | Status: DC | PRN

## 2023-10-29 MED ORDER — ROSUVASTATIN CALCIUM 5 MG PO TABS
10.0000 mg | ORAL_TABLET | Freq: Every day | ORAL | Status: DC
  Administered 2023-10-29 – 2023-10-31 (×3): 10 mg via ORAL
  Filled 2023-10-29 (×3): qty 2

## 2023-10-29 MED ORDER — ALTEPLASE 2 MG IJ SOLR: 2.0000 mg | Freq: Once | INTRAMUSCULAR | Status: DC | PRN

## 2023-10-29 MED ORDER — SEVELAMER CARBONATE 800 MG PO TABS
800.0000 mg | ORAL_TABLET | Freq: Three times a day (TID) | ORAL | Status: DC
  Administered 2023-10-29 – 2023-10-31 (×6): 800 mg via ORAL
  Filled 2023-10-29 (×6): qty 1

## 2023-10-29 MED ORDER — SODIUM CHLORIDE 0.9% FLUSH
3.0000 mL | Freq: Two times a day (BID) | INTRAVENOUS | Status: DC
  Administered 2023-10-29 – 2023-10-31 (×5): 3 mL via INTRAVENOUS

## 2023-10-29 MED ORDER — LIDOCAINE HCL (PF) 1 % IJ SOLN: 5.0000 mL | INTRAMUSCULAR | Status: DC | PRN

## 2023-10-29 MED ORDER — LEVOTHYROXINE SODIUM 112 MCG PO TABS
112.0000 ug | ORAL_TABLET | Freq: Every day | ORAL | Status: DC
  Administered 2023-10-30 – 2023-10-31 (×2): 112 ug via ORAL
  Filled 2023-10-29 (×2): qty 1

## 2023-10-29 MED ORDER — HEPARIN SODIUM (PORCINE) 1000 UNIT/ML DIALYSIS: 1000.0000 [IU] | INTRAMUSCULAR | Status: DC | PRN

## 2023-10-29 MED ORDER — MODAFINIL 100 MG PO TABS
200.0000 mg | ORAL_TABLET | Freq: Every day | ORAL | Status: DC
  Administered 2023-10-29 – 2023-10-31 (×3): 200 mg via ORAL
  Filled 2023-10-29 (×3): qty 2

## 2023-10-29 MED ORDER — IPRATROPIUM-ALBUTEROL 0.5-2.5 (3) MG/3ML IN SOLN: 3.0000 mL | Freq: Four times a day (QID) | RESPIRATORY_TRACT | Status: DC

## 2023-10-29 MED ORDER — GUAIFENESIN ER 600 MG PO TB12
1200.0000 mg | ORAL_TABLET | Freq: Two times a day (BID) | ORAL | Status: DC
  Administered 2023-10-29 – 2023-10-31 (×5): 1200 mg via ORAL
  Filled 2023-10-29 (×5): qty 2

## 2023-10-29 MED ORDER — LEVALBUTEROL HCL 0.63 MG/3ML IN NEBU: 0.6300 mg | INHALATION_SOLUTION | Freq: Four times a day (QID) | RESPIRATORY_TRACT | Status: DC

## 2023-10-29 MED ORDER — ANTICOAGULANT SODIUM CITRATE 4% (200MG/5ML) IV SOLN: 5.0000 mL | Status: DC | PRN

## 2023-10-29 MED ORDER — ALBUTEROL SULFATE (2.5 MG/3ML) 0.083% IN NEBU: 2.5000 mg | INHALATION_SOLUTION | Freq: Four times a day (QID) | RESPIRATORY_TRACT | Status: DC | PRN

## 2023-10-29 MED ORDER — NEPRO/CARBSTEADY PO LIQD: 237.0000 mL | ORAL | Status: DC | PRN

## 2023-10-29 MED ORDER — PREGABALIN 25 MG PO CAPS
150.0000 mg | ORAL_CAPSULE | Freq: Three times a day (TID) | ORAL | Status: DC
  Administered 2023-10-29 – 2023-10-31 (×6): 150 mg via ORAL
  Filled 2023-10-29 (×6): qty 2

## 2023-10-29 MED ORDER — IPRATROPIUM BROMIDE 0.02 % IN SOLN: 0.5000 mg | Freq: Four times a day (QID) | RESPIRATORY_TRACT | Status: DC

## 2023-10-29 NOTE — Evaluation (Signed)
 Clinical/Bedside Swallow Evaluation Patient Details  Name: [name] MRN: [id number] Date of Birth: 1950/04/13  Today's Date: 10/29/2023 Time: SLP Start Time (ACUTE ONLY): 1125 SLP Stop Time (ACUTE ONLY): 1131 SLP Time Calculation (min) (ACUTE ONLY): 6 min  Past Medical History:  Past Medical History:  Diagnosis Date   Acute osteomyelitis of metatarsal bone of left foot (HCC)    Ambulates with cane    straight cane   Anemia    Cervical myelopathy (HCC) 02/06/2018   Chronic kidney disease    dailysis [facility]- home   Community acquired pneumonia of left lower lobe of lung 08/18/2021   Complication of anesthesia    Coronary artery disease    Dehiscence of amputation stump of left lower extremity (HCC)    Diabetes mellitus without complication (HCC)    type2   Diabetic foot ulcer (HCC)    Diabetic neuropathy (HCC) 02/06/2018   Dysrhythmia    Gait abnormality 08/24/2018   GERD (gastroesophageal reflux disease)     01/06/20- not current   Gout    History of blood transfusion    History of blood transfusion    History of kidney stones    passed stones   Hypercholesteremia    Hypertension    Hypothyroidism    Neuromuscular disorder (HCC)    neuropathy left leg and bilateral feet   Neuropathy    Partial nontraumatic amputation of foot, 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   PONV (postoperative nausea and vomiting)    Prostate cancer (HCC)    PVD (peripheral vascular disease) (HCC)    with amputations   Past Surgical History:  Past Surgical History:  Procedure Laterality Date   A-FLUTTER ABLATION N/A 04/06/2020   Procedure: A-FLUTTER ABLATION;  Surgeon: Waddell Danelle ORN, MD;  Location: [facility] INVASIVE CV LAB;  Service: Cardiovascular;  Laterality: N/A;   A/V FISTULAGRAM N/A 03/05/2023   Procedure: A/V Fistulagram;  Surgeon: Pearline [name] RAMAN, MD;  Location: [facility] INVASIVE CV LAB;  Service: Vascular;  Laterality: N/A;   AMPUTATION Left 12/25/2013   Procedure: AMPUTATION RAY LEFT 5TH RAY;  Surgeon:  Jerona Harden GAILS, MD;  Location: [facility] ORS;  Service: Orthopedics;  Laterality: Left;   AMPUTATION Right 12/15/2018   Procedure: AMPUTATION OF 4TH AND 5TH TOES RIGHT FOOT;  Surgeon: Harden Jerona GAILS, MD;  Location: [facility] OR;  Service: Orthopedics;  Laterality: Right;   AMPUTATION Left 02/02/2021   Procedure: LEFT FOOT 4TH RAY AMPUTATION;  Surgeon: Harden Jerona GAILS, MD;  Location: [facility] OR;  Service: Orthopedics;  Laterality: Left;   AMPUTATION Left 05/09/2021   Procedure: LEFT BELOW KNEE AMPUTATION;  Surgeon: Harden Jerona GAILS, MD;  Location: [facility] OR;  Service: Orthopedics;  Laterality: Left;   AMPUTATION Right 07/26/2022   Procedure: RIGHT BELOW KNEE AMPUTATION;  Surgeon: Harden Jerona GAILS, MD;  Location: [facility] OR;  Service: Orthopedics;  Laterality: Right;   APPLICATION OF WOUND VAC Right 12/15/2018   Procedure: APPLICATION OF WOUND VAC;  Surgeon: Harden Jerona GAILS, MD;  Location: [facility] OR;  Service: Orthopedics;  Laterality: Right;   AV FISTULA PLACEMENT Left 03/25/2019   Procedure: LEFT ARM ARTERIOVENOUS (AV) FISTULA CREATION;  Surgeon: Serene Gaile ORN, MD;  Location: [facility] OR;  Service: Vascular;  Laterality: Left;   BACK SURGERY     BASCILIC VEIN TRANSPOSITION Left 05/20/2019   Procedure: SECOND STAGE LEFT BASCILIC VEIN TRANSPOSITION;  Surgeon: Serene Gaile ORN, MD;  Location: [facility] OR;  Service: Vascular;  Laterality: Left;   BIOPSY  06/22/2022  Procedure: BIOPSY;  Surgeon: Wilhelmenia Aloha Raddle., MD;  Location: [facility] ENDOSCOPY;  Service: Gastroenterology;;   CARDIAC CATHETERIZATION  02/17/2014   CATARACT EXTRACTION Bilateral    CHOLECYSTECTOMY     COLONOSCOPY  2011   in [place], Normal   COLONOSCOPY N/A 06/23/2022   Procedure: COLONOSCOPY;  Surgeon: Legrand Victory LITTIE DOUGLAS, MD;  Location: [facility] ENDOSCOPY;  Service: Gastroenterology;  Laterality: N/A;   CORONARY ARTERY BYPASS GRAFT  2008   CYSTOSCOPY N/A 06/29/2020   Procedure: CYSTOSCOPY WITH CLOT EVACUATION AND  FULGERATION;  Surgeon: Matilda Senior, MD;  Location: [facility]  OR;  Service: Urology;  Laterality: N/A;   CYSTOSCOPY WITH FULGERATION Bilateral 06/17/2020   Procedure: CYSTOSCOPY,BILATERAL RETROGRADE, CLOT EVACUATION WITH FULGERATION OF THE BLADDER;  Surgeon: Selma Donnice SAUNDERS, MD;  Location: [facility] OR;  Service: Urology;  Laterality: Bilateral;   CYSTOSCOPY WITH FULGERATION N/A 06/28/2020   Procedure: CYSTOSCOPY WITH CLOT EVACUATION AND FULGERATION OF BLEEDERS;  Surgeon: Matilda Senior, MD;  Location: [facility] OR;  Service: Urology;  Laterality: N/A;  1 HR   ENTEROSCOPY N/A 06/22/2022   Procedure: ENTEROSCOPY;  Surgeon: Wilhelmenia Aloha Raddle., MD;  Location: [facility] ENDOSCOPY;  Service: Gastroenterology;  Laterality: N/A;   HEMOSTASIS CLIP PLACEMENT  06/22/2022   Procedure: HEMOSTASIS CLIP PLACEMENT;  Surgeon: Wilhelmenia Aloha Raddle., MD;  Location: [facility] ENDOSCOPY;  Service: Gastroenterology;;   HOT HEMOSTASIS N/A 06/22/2022   Procedure: HOT HEMOSTASIS (ARGON PLASMA COAGULATION/BICAP);  Surgeon: Wilhelmenia Aloha Raddle., MD;  Location: [facility] ENDOSCOPY;  Service: Gastroenterology;  Laterality: N/A;   I & D EXTREMITY Right 12/15/2018   Procedure: DEBRIDEMENT RIGHT FOOT;  Surgeon: Harden Jerona GAILS, MD;  Location: [facility] OR;  Service: Orthopedics;  Laterality: Right;   I & D EXTREMITY Right 08/27/2019   Procedure: PARTIAL CUBOID EXCISION RIGHT FOOT;  Surgeon: Harden Jerona GAILS, MD;  Location: [facility] OR;  Service: Orthopedics;  Laterality: Right;   I & D EXTREMITY Right 01/07/2020   Procedure: RIGHT FOOT EXCISION INFECTED BONE;  Surgeon: Harden Jerona GAILS, MD;  Location: [facility] OR;  Service: Orthopedics;  Laterality: Right;   I & D EXTREMITY Right 05/17/2022   Procedure: IRRIGATION AND DEBRIDEMENT RIGHT FOOT ABSCESS;  Surgeon: Harden Jerona GAILS, MD;  Location: [facility] OR;  Service: Orthopedics;  Laterality: Right;   IR PARACENTESIS  10/11/2022   IR PARACENTESIS  12/24/2022   IR PARACENTESIS  02/06/2023   IR PARACENTESIS  05/05/2023   IR PARACENTESIS  05/19/2023   IR PARACENTESIS  07/11/2023   IR  PARACENTESIS  07/30/2023   IR PARACENTESIS  08/20/2023   IR PARACENTESIS  09/19/2023   IR PARACENTESIS  10/03/2023   IR PARACENTESIS  10/17/2023   NECK SURGERY     novemver 2019   PERIPHERAL VASCULAR BALLOON ANGIOPLASTY Left 03/05/2023   Procedure: PERIPHERAL VASCULAR BALLOON ANGIOPLASTY;  Surgeon: Pearline [name] RAMAN, MD;  Location: [facility] INVASIVE CV LAB;  Service: Vascular;  Laterality: Left;  AVF   POLYPECTOMY  06/22/2022   Procedure: POLYPECTOMY;  Surgeon: Mansouraty, Aloha Raddle., MD;  Location: [facility] ENDOSCOPY;  Service: Gastroenterology;;   POLYPECTOMY  06/23/2022   Procedure: POLYPECTOMY;  Surgeon: Legrand Victory LITTIE DOUGLAS, MD;  Location: [facility] ENDOSCOPY;  Service: Gastroenterology;;   STUMP REVISION Left 06/27/2021   Procedure: REVISION LEFT BELOW KNEE AMPUTATION;  Surgeon: Harden Jerona GAILS, MD;  Location: [facility] OR;  Service: Orthopedics;  Laterality: Left;   SUBMUCOSAL LIFTING INJECTION  06/22/2022   Procedure: SUBMUCOSAL LIFTING INJECTION;  Surgeon: Wilhelmenia Aloha Raddle., MD;  Location: [facility] ENDOSCOPY;  Service: Gastroenterology;;  SUBMUCOSAL TATTOO INJECTION  06/22/2022   Procedure: SUBMUCOSAL TATTOO INJECTION;  Surgeon: Wilhelmenia Aloha Raddle., MD;  Location: [facility] ENDOSCOPY;  Service: Gastroenterology;;   TRANSURETHRAL RESECTION OF PROSTATE N/A 06/28/2020   Procedure: TRANSURETHRAL RESECTION OF THE PROSTATE (TURP);  Surgeon: Matilda Senior, MD;  Location: [facility] OR;  Service: Urology;  Laterality: N/A;   WISDOM TOOTH EXTRACTION     HPI:  [name] is a 73 year old man who presents emergency complaining of cough and shortness of breath. CXR 7/22: Hypoventilatory changes with patchy airspace opacities at the bases  either representing atelectasis or pneumonia.  Pt reports prior hx pna this year.  Pt with past medical history of ESRD on dialysis TTS schedule, hypothyroidism, chronic thrombocytopenia history of CAD status post CABG, DM type II, osteomyelitis s/p bilateral BKA, history of GI bleed and GERD     Assessment / Plan / Recommendation  Clinical Impression  Pt presents with clinical indicators of pharyngeal dysphagia.  Pt exhibited frequent cough and throat clear will all PO consistencies today.  He says he sometimes does this at baseline.  Pt endorses hx of pna, but could not recall when.  He did indicate it was earlier this year.  CXR from 7/22 concerning for atalectasis v pneumonia.  Pt declines MBS swallow evaluation at this time. Provided education re aspiration pneumonia.  Reviewed general aspiration precautions.  Pt with clinical swallow evaluation in April 2024 with IPR.  At that time pt reported difficulty swallowing characterized by foods and liquids feeling stuck. Patient reports he has been able to eat very little due to difficulty as well as difficulty with taking medications whole with thin.  He declined solid trials at that time 2/2 fear of getting choked.  This description may indicate some component of esophageal dysphagia as well. SLP will monitor for tolerance as pt does not want to pursue any further assessment.  Consider re-engaging with pt on willingness to complete additional workup, either oropharygneal or esophageal, for swallowing.    Recommend continuing regular texture diet with thin liquids with general aspiration precautions.   SLP Visit Diagnosis: Dysphagia, unspecified (R13.10)    Aspiration Risk  Mild aspiration risk    Diet Recommendation Regular;Thin liquid    Liquid Administration via: Straw;Cup Medication Administration: Whole meds with liquid Supervision: Patient able to self feed Compensations: Slow rate;Small sips/bites Postural Changes: Seated upright at 90 degrees    Other  Recommendations Oral Care Recommendations: Oral care BID     Assistance Recommended at Discharge    Functional Status Assessment Patient has had a recent decline in their functional status and demonstrates the ability to make significant improvements in function in a  reasonable and predictable amount of time.  Frequency and Duration min 2x/week  2 weeks       Prognosis Prognosis for improved oropharyngeal function:  (N/A)      Swallow Study   General Date of Onset: 10/28/23 HPI: [name] is a 73 year old man who presents emergency complaining of cough and shortness of breath. CXR 7/22: Hypoventilatory changes with patchy airspace opacities at the bases  either representing atelectasis or pneumonia.  Pt reports prior hx pna this year.  Pt with past medical history of ESRD on dialysis TTS schedule, hypothyroidism, chronic thrombocytopenia history of CAD status post CABG, DM type II, osteomyelitis s/p bilateral BKA, history of GI bleed and GERD Type of Study: Bedside Swallow Evaluation Previous Swallow Assessment: limited clinical evaluation April 2024 with IPR Diet Prior to this Study: Regular;Thin liquids (Level  0) Temperature Spikes Noted: No Respiratory Status: Nasal cannula (1L) History of Recent Intubation: No Behavior/Cognition: Alert;Cooperative Oral Cavity Assessment: Within Functional Limits Oral Care Completed by SLP: No Oral Cavity - Dentition: Adequate natural dentition Vision: Functional for self-feeding Self-Feeding Abilities: Able to feed self Patient Positioning: Upright in bed Baseline Vocal Quality: Normal Volitional Cough: Strong Volitional Swallow: Able to elicit    Oral/Motor/Sensory Function Overall Oral Motor/Sensory Function: Within functional limits Facial ROM: Within Functional Limits Facial Symmetry: Within Functional Limits Lingual ROM: Within Functional Limits Lingual Symmetry: Within Functional Limits Lingual Strength: Within Functional Limits Velum: Within Functional Limits Mandible: Within Functional Limits   Ice Chips Ice chips: Not tested   Thin Liquid Thin Liquid: Impaired Presentation: Straw Pharyngeal  Phase Impairments: Throat Clearing - Immediate;Cough - Immediate    Nectar Thick Nectar Thick  Liquid: Not tested   Honey Thick Honey Thick Liquid: Not tested   Puree Puree: Impaired Presentation: Spoon Pharyngeal Phase Impairments: Cough - Immediate;Throat Clearing - Immediate   Solid     Solid: Impaired Presentation: Self Fed Pharyngeal Phase Impairments: Throat Clearing - Immediate      Anette FORBES Grippe, MA, CCC-SLP Acute Rehabilitation Services Office: [phone] 10/29/2023,11:49 AM

## 2023-10-29 NOTE — Consult Note (Addendum)
 Renal Service Consult Note [facility]  [name] 10/29/2023 Jon JONETTA Fret, MD Requesting Physician: Isaiah Lever, NP  Reason for Consult: ESRD patient with HPI: The patient is a 73 y.o. year-old w/ PMH as below who presented to ED complaining of shortness of breath and coughing.  No chest pain or hemoptysis, no fevers, chills or sweats.  In ED patient was afebrile, BP 190/70, O2 sat 82% on room air, negative COVID, RSV and flu.  Blood culture sent, normal lactic acid, proBNP 35,000, electrolytes stable.  Chest x-ray showed patchy airspace disease in the bases concern for atelectasis or pneumonia.  Patient received the Rocephin  and doxycycline  in the ER also DuoNeb.  Last dialysis was Tuesday patient is on TTS schedule.  Was admitted, we were asked to see for dialysis.   Pt seen in hospital room.  History is as above.  No other new complaints.   ROS - denies CP, no joint pain, no HA, no blurry vision, no rash, no diarrhea, no nausea/ vomiting   Past Medical History  Past Medical History:  Diagnosis Date   Acute osteomyelitis of metatarsal bone of left foot (HCC)    Ambulates with cane    straight cane   Anemia    Cervical myelopathy (HCC) 02/06/2018   Chronic kidney disease    dailysis [facility]- home   Community acquired pneumonia of left lower lobe of lung 08/18/2021   Complication of anesthesia    Coronary artery disease    Dehiscence of amputation stump of left lower extremity (HCC)    Diabetes mellitus without complication (HCC)    type2   Diabetic foot ulcer (HCC)    Diabetic neuropathy (HCC) 02/06/2018   Dysrhythmia    Gait abnormality 08/24/2018   GERD (gastroesophageal reflux disease)     01/06/20- not current   Gout    History of blood transfusion    History of blood transfusion    History of kidney stones    passed stones   Hypercholesteremia    Hypertension    Hypothyroidism    Neuromuscular disorder (HCC)    neuropathy left leg and  bilateral feet   Neuropathy    Partial nontraumatic amputation of foot, 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   PONV (postoperative nausea and vomiting)    Prostate cancer (HCC)    PVD (peripheral vascular disease) (HCC)    with amputations   Past Surgical History  Past Surgical History:  Procedure Laterality Date   A-FLUTTER ABLATION N/A 04/06/2020   Procedure: A-FLUTTER ABLATION;  Surgeon: Waddell Danelle ORN, MD;  Location: [facility] INVASIVE CV LAB;  Service: Cardiovascular;  Laterality: N/A;   A/V FISTULAGRAM N/A 03/05/2023   Procedure: A/V Fistulagram;  Surgeon: Pearline [name] RAMAN, MD;  Location: [facility] INVASIVE CV LAB;  Service: Vascular;  Laterality: N/A;   AMPUTATION Left 12/25/2013   Procedure: AMPUTATION RAY LEFT 5TH RAY;  Surgeon: Jerona Harden GAILS, MD;  Location: [facility] ORS;  Service: Orthopedics;  Laterality: Left;   AMPUTATION Right 12/15/2018   Procedure: AMPUTATION OF 4TH AND 5TH TOES RIGHT FOOT;  Surgeon: Harden Jerona GAILS, MD;  [facility];  Service: Orthopedics;  Laterality: Right;   AMPUTATION Left 02/02/2021   Procedure: LEFT FOOT 4TH RAY AMPUTATION;  Surgeon: Harden Jerona GAILS, MD;  Location: [facility] OR;  Service: Orthopedics;  Laterality: Left;   AMPUTATION Left 05/09/2021   Procedure: LEFT BELOW KNEE AMPUTATION;  Surgeon: Harden Jerona GAILS, MD;  Location: [facility] OR;  Service: Orthopedics;  Laterality:  Left;   AMPUTATION Right 07/26/2022   Procedure: RIGHT BELOW KNEE AMPUTATION;  Surgeon: Harden Jerona GAILS, MD;  Location: [facility] OR;  Service: Orthopedics;  Laterality: Right;   APPLICATION OF WOUND VAC Right 12/15/2018   Procedure: APPLICATION OF WOUND VAC;  Surgeon: Harden Jerona GAILS, MD;  Location: [facility] OR;  Service: Orthopedics;  Laterality: Right;   AV FISTULA PLACEMENT Left 03/25/2019   Procedure: LEFT ARM ARTERIOVENOUS (AV) FISTULA CREATION;  Surgeon: Serene Gaile ORN, MD;  Location: [facility] OR;  Service: Vascular;  Laterality: Left;   BACK SURGERY     BASCILIC VEIN TRANSPOSITION Left 05/20/2019   Procedure: SECOND STAGE LEFT  BASCILIC VEIN TRANSPOSITION;  Surgeon: Serene Gaile ORN, MD;  Location: [facility] OR;  Service: Vascular;  Laterality: Left;   BIOPSY  06/22/2022   Procedure: BIOPSY;  Surgeon: Wilhelmenia Aloha Raddle., MD;  Location: [facility] ENDOSCOPY;  Service: Gastroenterology;;   CARDIAC CATHETERIZATION  02/17/2014   CATARACT EXTRACTION Bilateral    CHOLECYSTECTOMY     COLONOSCOPY  2011   [place], [place]   COLONOSCOPY N/A 06/23/2022   Procedure: COLONOSCOPY;  Surgeon: Legrand Victory LITTIE DOUGLAS, MD;  Location: [facility] ENDOSCOPY;  Service: Gastroenterology;  Laterality: N/A;   CORONARY ARTERY BYPASS GRAFT  2008   CYSTOSCOPY N/A 06/29/2020   Procedure: CYSTOSCOPY WITH CLOT EVACUATION AND  FULGERATION;  Surgeon: Matilda Senior, MD;  Location: [facility] OR;  Service: Urology;  Laterality: N/A;   CYSTOSCOPY WITH FULGERATION Bilateral 06/17/2020   Procedure: CYSTOSCOPY,BILATERAL RETROGRADE, CLOT EVACUATION WITH FULGERATION OF THE BLADDER;  Surgeon: Selma Donnice SAUNDERS, MD;  Location: [facility] OR;  Service: Urology;  Laterality: Bilateral;   CYSTOSCOPY WITH FULGERATION N/A 06/28/2020   Procedure: CYSTOSCOPY WITH CLOT EVACUATION AND FULGERATION OF BLEEDERS;  Surgeon: Matilda Senior, MD;  Location: [facility] OR;  Service: Urology;  Laterality: N/A;  1 HR   ENTEROSCOPY N/A 06/22/2022   Procedure: ENTEROSCOPY;  Surgeon: Wilhelmenia Aloha Raddle., MD;  Location: [facility] ENDOSCOPY;  Service: Gastroenterology;  Laterality: N/A;   HEMOSTASIS CLIP PLACEMENT  06/22/2022   Procedure: HEMOSTASIS CLIP PLACEMENT;  Surgeon: Wilhelmenia Aloha Raddle., MD;  Location: [facility] ENDOSCOPY;  Service: Gastroenterology;;   HOT HEMOSTASIS N/A 06/22/2022   Procedure: HOT HEMOSTASIS (ARGON PLASMA COAGULATION/BICAP);  Surgeon: Wilhelmenia Aloha Raddle., MD;  Location: [facility] ENDOSCOPY;  Service: Gastroenterology;  Laterality: N/A;   I & D EXTREMITY Right 12/15/2018   Procedure: DEBRIDEMENT RIGHT FOOT;  Surgeon: Harden Jerona GAILS, MD;  Location: [facility] OR;  Service: Orthopedics;  Laterality: Right;    I & D EXTREMITY Right 08/27/2019   Procedure: PARTIAL CUBOID EXCISION RIGHT FOOT;  Surgeon: Harden Jerona GAILS, MD;  Location: [facility] OR;  Service: Orthopedics;  Laterality: Right;   I & D EXTREMITY Right 01/07/2020   Procedure: RIGHT FOOT EXCISION INFECTED BONE;  Surgeon: Harden Jerona GAILS, MD;  Location: [facility] OR;  Service: Orthopedics;  Laterality: Right;   I & D EXTREMITY Right 05/17/2022   Procedure: IRRIGATION AND DEBRIDEMENT RIGHT FOOT ABSCESS;  Surgeon: Harden Jerona GAILS, MD;  Location: [facility] OR;  Service: Orthopedics;  Laterality: Right;   IR PARACENTESIS  10/11/2022   IR PARACENTESIS  12/24/2022   IR PARACENTESIS  02/06/2023   IR PARACENTESIS  05/05/2023   IR PARACENTESIS  05/19/2023   IR PARACENTESIS  07/11/2023   IR PARACENTESIS  07/30/2023   IR PARACENTESIS  08/20/2023   IR PARACENTESIS  09/19/2023   IR PARACENTESIS  10/03/2023   IR PARACENTESIS  10/17/2023   NECK SURGERY     novemver  2019   PERIPHERAL VASCULAR BALLOON ANGIOPLASTY Left 03/05/2023   Procedure: PERIPHERAL VASCULAR BALLOON ANGIOPLASTY;  Surgeon: Pearline [name] RAMAN, MD;  Location: [facility] INVASIVE CV LAB;  Service: Vascular;  Laterality: Left;  AVF   POLYPECTOMY  06/22/2022   Procedure: POLYPECTOMY;  Surgeon: Mansouraty, Aloha Raddle., MD;  Location: [facility] ENDOSCOPY;  Service: Gastroenterology;;   POLYPECTOMY  06/23/2022   Procedure: POLYPECTOMY;  Surgeon: Legrand Victory LITTIE DOUGLAS, MD;  Location: [facility] ENDOSCOPY;  Service: Gastroenterology;;   STUMP REVISION Left 06/27/2021   Procedure: REVISION LEFT BELOW KNEE AMPUTATION;  Surgeon: Harden Jerona GAILS, MD;  Location: [facility] OR;  Service: Orthopedics;  Laterality: Left;   SUBMUCOSAL LIFTING INJECTION  06/22/2022   Procedure: SUBMUCOSAL LIFTING INJECTION;  Surgeon: Wilhelmenia Aloha Raddle., MD;  Location: [facility] ENDOSCOPY;  Service: Gastroenterology;;   SUBMUCOSAL TATTOO INJECTION  06/22/2022   Procedure: SUBMUCOSAL TATTOO INJECTION;  Surgeon: Wilhelmenia Aloha Raddle., MD;  Location: [facility] ENDOSCOPY;  Service:  Gastroenterology;;   TRANSURETHRAL RESECTION OF PROSTATE N/A 06/28/2020   Procedure: TRANSURETHRAL RESECTION OF THE PROSTATE (TURP);  Surgeon: Matilda Senior, MD;  Location: [facility] OR;  Service: Urology;  Laterality: N/A;   WISDOM TOOTH EXTRACTION     Family History  Family History  Problem Relation Age of Onset   Diabetes Mellitus II Mother    Kidney disease Mother    Diabetes Mellitus II Father    CAD Father    Cancer Father        prostate   Kidney disease Father    Diabetes Mellitus II Brother    Kidney disease Brother    Diabetes Mellitus II Brother    Stomach cancer Brother 68   Kidney disease Brother    Colon cancer Neg Hx    Colon polyps Neg Hx    Esophageal cancer Neg Hx    Rectal cancer Neg Hx    Pancreatic cancer Neg Hx    Social History  reports that he quit smoking about 34 years ago. His smoking use included cigars. He started smoking about 50 years ago. He has never used smokeless tobacco. He reports that he does not drink alcohol  and does not use drugs. Allergies  Allergies  Allergen Reactions   Morphine  Other (See Comments)    Other reaction(s): Delusions (intolerance)   Mushroom Extract Complex (Obsolete) Nausea Only   Home medications Prior to Admission medications   Medication Sig Start Date End Date Taking? Authorizing Provider  acetaminophen  (TYLENOL ) 325 MG tablet Take 2 tablets (650 mg total) by mouth every 4 (four) hours as needed for fever or mild pain. 08/08/22   Angiulli, Toribio PARAS, PA-C  albuterol  (VENTOLIN  HFA) 108 (90 Base) MCG/ACT inhaler Inhale 2 puffs into the lungs every 6 (six) hours as needed for shortness of breath or wheezing. 05/13/23 11/09/23  [provider]  allopurinol  (ZYLOPRIM ) 100 MG tablet Take 2 tablets (200 mg total) by mouth daily. 08/08/22   Angiulli, Toribio PARAS, PA-C  calcitRIOL  (ROCALTROL ) 0.5 MCG capsule Take 0.5 mcg by mouth 3 (three) times a week. 02/17/23   [provider]  doxycycline  (VIBRAMYCIN ) 100 MG  capsule Take 1 capsule (100 mg total) by mouth 2 (two) times daily. One po bid x 7 days 09/12/23   Geroldine Berg, MD  levothyroxine  (SYNTHROID ) 112 MCG tablet Take 1 tablet (112 mcg total) by mouth daily before breakfast. 05/22/21   Angiulli, Toribio PARAS, PA-C  Methoxy PEG-Epoetin Beta (MIRCERA IJ) Inject into the skin. 08/20/22   [provider]  modafinil  (PROVIGIL )  100 MG tablet Take 1 tablet (100 mg total) by mouth daily. 10/06/23   Lovorn, Megan, MD  modafinil  (PROVIGIL ) 200 MG tablet Take 1 tablet by mouth once daily 10/12/23   Raulkar, Krutika P, MD  OZEMPIC , 0.25 OR 0.5 MG/DOSE, 2 MG/3ML SOPN Inject 0.25 mg into the skin once a week. 09/19/21   [provider]  pantoprazole  (PROTONIX ) 40 MG tablet Take 1 tablet (40 mg total) by mouth daily. 09/15/23   [name], NP  pregabalin  (LYRICA ) 150 MG capsule Take 1 capsule (150 mg total) by mouth 3 (three) times daily. 09/15/23   [name], NP  propranolol  (INDERAL ) 40 MG tablet Take 40 mg by mouth daily. 03/24/23   [provider]  rosuvastatin  (CRESTOR ) 10 MG tablet Take 1 tablet (10 mg total) by mouth daily. 08/08/22   Angiulli, Toribio PARAS, PA-C  sevelamer  carbonate (RENVELA ) 800 MG tablet Take 800 mg by mouth 3 (three) times daily. 01/17/23   [provider]  torsemide  (DEMADEX ) 20 MG tablet Take 1 tablet (20 mg total) by mouth 2 (two) times daily. Patient not taking: Reported on 09/15/2023 06/16/23   Ladona Heinz, MD     Vitals:   10/29/23 9366 10/29/23 9364 10/29/23 0750 10/29/23 1115  BP:   (!) 151/81 (!) 114/93  Pulse:   78 (!) 153  Resp:   19 (!) 22  Temp:   97.7 F (36.5 C) 97.7 F (36.5 C)  TempSrc: Oral  Oral Oral  SpO2:   100% 93%  Height:  5' 11 (1.803 m)     Exam Gen alert, no distress, 1-3 L [place] O2 No rash, cyanosis or gangrene Sclera anicteric, throat clear  No jvd or bruits Chest clear bilat to bases, no rales/ wheezing RRR no MRG Abd soft ntnd no mass or ascites +bs GU  deferred Bilat BKA Ext bilat 2+ UE, stump and lower abd wall pitting edema Neuro is alert, Ox 3 , nf    LUA AVF+bruit   Home bp meds: Propranolol  40mg  every day Demadex  20mg  bid (not taking) Others: statin, renvela , PPI, ozempic , T4, rocaltrol  0.5mcg tts, zyloprim    OP HD: TTS [facility] 3h  B400   87.9kg  2K bath  AVF  Heparin  none Last OP HD 7/22, post wt 98kg (10kg over) Coming off 5-10 kg over after rx's for last 3 weeks    Assessment/ Plan: SOB/ PNA: started on IV abx in Ed yesterday, per pmd, poss vol also Vol overload: has sig LE, hip and abd wall edema. May be part of #1, coming off 6-10kg over recently.  ESRD: on HD TTS. Last HD yesterday. Plan HD today/ tonight given severity of edema and hypoxia.  HTN: bp's stable, on inderal  alone. Continue.  Anemia of esrd:        Myer Fret  MD [facility] 10/29/2023, 12:52 PM  Recent Labs  Lab 10/28/23 2109  HGB 11.9*  CALCIUM  8.8*  CREATININE 4.56*  K 5.1   Inpatient medications:  allopurinol   200 mg Oral Daily   calcitRIOL   0.5 mcg Oral Once per day on Monday Wednesday Friday   doxycycline   100 mg Oral BID   guaiFENesin   1,200 mg Oral BID   heparin   5,000 Units Subcutaneous Q8H   insulin  aspart  0-5 Units Subcutaneous QHS   insulin  aspart  0-6 Units Subcutaneous TID WC   ipratropium-albuterol   3 mL Nebulization Q6H   levothyroxine   112 mcg Oral Q0600   modafinil   200  mg Oral Daily   pantoprazole   40 mg Oral Daily   pregabalin   150 mg Oral TID   propranolol   40 mg Oral Daily   rosuvastatin   10 mg Oral Daily   sevelamer  carbonate  800 mg Oral TID with meals   sodium chloride  flush  3 mL Intravenous Q12H    albuterol 

## 2023-10-29 NOTE — Consult Note (Addendum)
 Hospital Consult    Reason for Consult: Suspected steal syndrome of the left hand Requesting Physician: Isaiah Lever MRN #:  [id number]  History of Present Illness: This is a 73 y.o. male well-known to [facility] with numerous endovascular interventions of bilateral lower extremities ultimately ending in bilateral below the knee amputations.  Past medical history also significant for end-stage renal disease on hemodialysis.  He is being seen in consultation for evaluation of left hand due to concern for steal syndrome.  He has a left arm brachiobasilic fistula which was created in 2 stages back in 2021.  He has required endovascular interventions namely for outflow related stenosis.  He most recently underwent outflow.  Vein angioplasty by Dr. Pearline in November 2024.  He is having edema in his left hand however believes this has been going on for a long time.  He denies any pain in his left hand.  He reports his left arm AV fistula is functioning appropriately during HD.  He is admitted for treatment of pneumonia.  Past Medical History:  Diagnosis Date   Acute osteomyelitis of metatarsal bone of left foot (HCC)    Ambulates with cane    straight cane   Anemia    Cervical myelopathy (HCC) 02/06/2018   Chronic kidney disease    dailysis [facility]- home   Community acquired pneumonia of left lower lobe of lung 08/18/2021   Complication of anesthesia    Coronary artery disease    Dehiscence of amputation stump of left lower extremity (HCC)    Diabetes mellitus without complication (HCC)    type2   Diabetic foot ulcer (HCC)    Diabetic neuropathy (HCC) 02/06/2018   Dysrhythmia    Gait abnormality 08/24/2018   GERD (gastroesophageal reflux disease)     01/06/20- not current   Gout    History of blood transfusion    History of blood transfusion    History of kidney stones    passed stones   Hypercholesteremia    Hypertension    Hypothyroidism    Neuromuscular disorder (HCC)    neuropathy  left leg and bilateral feet   Neuropathy    Partial nontraumatic amputation of foot, 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   PONV (postoperative nausea and vomiting)    Prostate cancer (HCC)    PVD (peripheral vascular disease) (HCC)    with amputations    Past Surgical History:  Procedure Laterality Date   A-FLUTTER ABLATION N/A 04/06/2020   Procedure: A-FLUTTER ABLATION;  Surgeon: Waddell Danelle ORN, MD;  Location: [facility] INVASIVE CV LAB;  Service: Cardiovascular;  Laterality: N/A;   A/V FISTULAGRAM N/A 03/05/2023   Procedure: A/V Fistulagram;  Surgeon: Pearline [name] RAMAN, MD;  Location: [facility] INVASIVE CV LAB;  Service: Vascular;  Laterality: N/A;   AMPUTATION Left 12/25/2013   Procedure: AMPUTATION RAY LEFT 5TH RAY;  Surgeon: Jerona Harden GAILS, MD;  Location: [facility] ORS;  Service: Orthopedics;  Laterality: Left;   AMPUTATION Right 12/15/2018   Procedure: AMPUTATION OF 4TH AND 5TH TOES RIGHT FOOT;  Surgeon: Harden Jerona GAILS, MD;  Location: [facility] OR;  Service: Orthopedics;  Laterality: Right;   AMPUTATION Left 02/02/2021   Procedure: LEFT FOOT 4TH RAY AMPUTATION;  Surgeon: Harden Jerona GAILS, MD;  Location: [facility] OR;  Service: Orthopedics;  Laterality: Left;   AMPUTATION Left 05/09/2021   Procedure: LEFT BELOW KNEE AMPUTATION;  Surgeon: Harden Jerona GAILS, MD;  Location: [facility] OR;  Service: Orthopedics;  Laterality: Left;   AMPUTATION Right 07/26/2022   Procedure:  RIGHT BELOW KNEE AMPUTATION;  Surgeon: Harden Jerona GAILS, MD;  Location: [facility] OR;  Service: Orthopedics;  Laterality: Right;   APPLICATION OF WOUND VAC Right 12/15/2018   Procedure: APPLICATION OF WOUND VAC;  Surgeon: Harden Jerona GAILS, MD;  Location: [facility] OR;  Service: Orthopedics;  Laterality: Right;   AV FISTULA PLACEMENT Left 03/25/2019   Procedure: LEFT ARM ARTERIOVENOUS (AV) FISTULA CREATION;  Surgeon: Serene Gaile ORN, MD;  Location: [facility] OR;  Service: Vascular;  Laterality: Left;   BACK SURGERY     BASCILIC VEIN TRANSPOSITION Left 05/20/2019   Procedure: SECOND STAGE LEFT BASCILIC  VEIN TRANSPOSITION;  Surgeon: Serene Gaile ORN, MD;  Location: [facility] OR;  Service: Vascular;  Laterality: Left;   BIOPSY  06/22/2022   Procedure: BIOPSY;  Surgeon: Wilhelmenia Aloha Raddle., MD;  Location: [facility] ENDOSCOPY;  Service: Gastroenterology;;   CARDIAC CATHETERIZATION  02/17/2014   CATARACT EXTRACTION Bilateral    CHOLECYSTECTOMY     COLONOSCOPY  2011   in [place], Normal   COLONOSCOPY N/A 06/23/2022   Procedure: COLONOSCOPY;  Surgeon: Legrand Victory LITTIE DOUGLAS, MD;  Location: [facility] ENDOSCOPY;  Service: Gastroenterology;  Laterality: N/A;   CORONARY ARTERY BYPASS GRAFT  2008   CYSTOSCOPY N/A 06/29/2020   Procedure: CYSTOSCOPY WITH CLOT EVACUATION AND  FULGERATION;  Surgeon: Matilda Senior, MD;  Location: [facility] OR;  Service: Urology;  Laterality: N/A;   CYSTOSCOPY WITH FULGERATION Bilateral 06/17/2020   Procedure: CYSTOSCOPY,BILATERAL RETROGRADE, CLOT EVACUATION WITH FULGERATION OF THE BLADDER;  Surgeon: Selma Donnice SAUNDERS, MD;  Location: [facility] OR;  Service: Urology;  Laterality: Bilateral;   CYSTOSCOPY WITH FULGERATION N/A 06/28/2020   Procedure: CYSTOSCOPY WITH CLOT EVACUATION AND FULGERATION OF BLEEDERS;  Surgeon: Matilda Senior, MD;  Location: [facility] OR;  Service: Urology;  Laterality: N/A;  1 HR   ENTEROSCOPY N/A 06/22/2022   Procedure: ENTEROSCOPY;  Surgeon: Wilhelmenia Aloha Raddle., MD;  Location: [facility] ENDOSCOPY;  Service: Gastroenterology;  Laterality: N/A;   HEMOSTASIS CLIP PLACEMENT  06/22/2022   Procedure: HEMOSTASIS CLIP PLACEMENT;  Surgeon: Wilhelmenia Aloha Raddle., MD;  Location: [facility] ENDOSCOPY;  Service: Gastroenterology;;   HOT HEMOSTASIS N/A 06/22/2022   Procedure: HOT HEMOSTASIS (ARGON PLASMA COAGULATION/BICAP);  Surgeon: Wilhelmenia Aloha Raddle., MD;  Location: [facility] ENDOSCOPY;  Service: Gastroenterology;  Laterality: N/A;   I & D EXTREMITY Right 12/15/2018   Procedure: DEBRIDEMENT RIGHT FOOT;  Surgeon: Harden Jerona GAILS, MD;  Location: [facility] OR;  Service: Orthopedics;  Laterality: Right;   I & D  EXTREMITY Right 08/27/2019   Procedure: PARTIAL CUBOID EXCISION RIGHT FOOT;  Surgeon: Harden Jerona GAILS, MD;  Location: [facility] OR;  Service: Orthopedics;  Laterality: Right;   I & D EXTREMITY Right 01/07/2020   Procedure: RIGHT FOOT EXCISION INFECTED BONE;  Surgeon: Harden Jerona GAILS, MD;  Location: [facility] OR;  Service: Orthopedics;  Laterality: Right;   I & D EXTREMITY Right 05/17/2022   Procedure: IRRIGATION AND DEBRIDEMENT RIGHT FOOT ABSCESS;  Surgeon: Harden Jerona GAILS, MD;  Location: [facility] OR;  Service: Orthopedics;  Laterality: Right;   IR PARACENTESIS  10/11/2022   IR PARACENTESIS  12/24/2022   IR PARACENTESIS  02/06/2023   IR PARACENTESIS  05/05/2023   IR PARACENTESIS  05/19/2023   IR PARACENTESIS  07/11/2023   IR PARACENTESIS  07/30/2023   IR PARACENTESIS  08/20/2023   IR PARACENTESIS  09/19/2023   IR PARACENTESIS  10/03/2023   IR PARACENTESIS  10/17/2023   NECK SURGERY     novemver 2019   PERIPHERAL VASCULAR BALLOON ANGIOPLASTY Left 03/05/2023  Procedure: PERIPHERAL VASCULAR BALLOON ANGIOPLASTY;  Surgeon: Pearline [name] RAMAN, MD;  Location: [facility] INVASIVE CV LAB;  Service: Vascular;  Laterality: Left;  AVF   POLYPECTOMY  06/22/2022   Procedure: POLYPECTOMY;  Surgeon: Mansouraty, Aloha Raddle., MD;  Location: [facility] ENDOSCOPY;  Service: Gastroenterology;;   POLYPECTOMY  06/23/2022   Procedure: POLYPECTOMY;  Surgeon: Legrand Victory LITTIE DOUGLAS, MD;  Location: [facility] ENDOSCOPY;  Service: Gastroenterology;;   STUMP REVISION Left 06/27/2021   Procedure: REVISION LEFT BELOW KNEE AMPUTATION;  Surgeon: Harden Jerona GAILS, MD;  Location: [facility] OR;  Service: Orthopedics;  Laterality: Left;   SUBMUCOSAL LIFTING INJECTION  06/22/2022   Procedure: SUBMUCOSAL LIFTING INJECTION;  Surgeon: Wilhelmenia Aloha Raddle., MD;  Location: [facility] ENDOSCOPY;  Service: Gastroenterology;;   SUBMUCOSAL TATTOO INJECTION  06/22/2022   Procedure: SUBMUCOSAL TATTOO INJECTION;  Surgeon: Wilhelmenia Aloha Raddle., MD;  Location: [facility] ENDOSCOPY;  Service: Gastroenterology;;    TRANSURETHRAL RESECTION OF PROSTATE N/A 06/28/2020   Procedure: TRANSURETHRAL RESECTION OF THE PROSTATE (TURP);  Surgeon: Matilda Senior, MD;  Location: [facility] OR;  Service: Urology;  Laterality: N/A;   WISDOM TOOTH EXTRACTION      Allergies  Allergen Reactions   Morphine  Other (See Comments)    Other reaction(s): Delusions (intolerance)   Mushroom Extract Complex (Obsolete) Nausea Only    Prior to Admission medications   Medication Sig Start Date End Date Taking? Authorizing Provider  acetaminophen  (TYLENOL ) 325 MG tablet Take 2 tablets (650 mg total) by mouth every 4 (four) hours as needed for fever or mild pain. 08/08/22   Angiulli, Toribio PARAS, PA-C  albuterol  (VENTOLIN  HFA) 108 (90 Base) MCG/ACT inhaler Inhale 2 puffs into the lungs every 6 (six) hours as needed for shortness of breath or wheezing. 05/13/23 11/09/23  [provider]  allopurinol  (ZYLOPRIM ) 100 MG tablet Take 2 tablets (200 mg total) by mouth daily. 08/08/22   Angiulli, Toribio PARAS, PA-C  calcitRIOL  (ROCALTROL ) 0.5 MCG capsule Take 0.5 mcg by mouth 3 (three) times a week. 02/17/23   [provider]  doxycycline  (VIBRAMYCIN ) 100 MG capsule Take 1 capsule (100 mg total) by mouth 2 (two) times daily. One po bid x 7 days 09/12/23   Geroldine Berg, MD  levothyroxine  (SYNTHROID ) 112 MCG tablet Take 1 tablet (112 mcg total) by mouth daily before breakfast. 05/22/21   Angiulli, Toribio PARAS, PA-C  Methoxy PEG-Epoetin Beta (MIRCERA IJ) Inject into the skin. 08/20/22   [provider]  modafinil  (PROVIGIL ) 100 MG tablet Take 1 tablet (100 mg total) by mouth daily. 10/06/23   Lovorn, Megan, MD  modafinil  (PROVIGIL ) 200 MG tablet Take 1 tablet by mouth once daily 10/12/23   Raulkar, Krutika P, MD  OZEMPIC , 0.25 OR 0.5 MG/DOSE, 2 MG/3ML SOPN Inject 0.25 mg into the skin once a week. 09/19/21   [provider]  pantoprazole  (PROTONIX ) 40 MG tablet Take 1 tablet (40 mg total) by mouth daily. 09/15/23   [name], NP   pregabalin  (LYRICA ) 150 MG capsule Take 1 capsule (150 mg total) by mouth 3 (three) times daily. 09/15/23   [name], NP  propranolol  (INDERAL ) 40 MG tablet Take 40 mg by mouth daily. 03/24/23   [provider]  rosuvastatin  (CRESTOR ) 10 MG tablet Take 1 tablet (10 mg total) by mouth daily. 08/08/22   Angiulli, Toribio PARAS, PA-C  sevelamer  carbonate (RENVELA ) 800 MG tablet Take 800 mg by mouth 3 (three) times daily. 01/17/23   [provider]  torsemide  (DEMADEX ) 20 MG tablet Take 1  tablet (20 mg total) by mouth 2 (two) times daily. 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  Tobacco Use   Smoking status: Former    Types: Cigars    Start date: 04/08/1973    Quit date: 12/24/1988    Years since quitting: 34.8   Smokeless tobacco: Never   Tobacco comments:    Cigars and Pipe   Vaping Use   Vaping status: Never Used  Substance and Sexual Activity   Alcohol  use: Never   Drug use: Never   Sexual activity: Yes    Partners: Female  Other Topics Concern   Not on file  Social History Narrative   Regular exercise: yes 3 times a week   Caffeine use: hot tea   Social Drivers of Corporate investment banker Strain: Low Risk  (09/15/2023)   Overall Financial Resource Strain (CARDIA)    Difficulty of Paying Living Expenses: Not hard at all  Food Insecurity: No Food Insecurity (10/29/2023)   Hunger Vital Sign    Worried About Running Out of Food in the Last Year: Never true    Ran Out of Food in the Last Year: Never true  Transportation Needs: No Transportation Needs (10/29/2023)   PRAPARE - Administrator, Civil Service (Medical): No    Lack of  Transportation (Non-Medical): No  Physical Activity: Inactive (09/15/2023)   Exercise Vital Sign    Days of Exercise per Week: 0 days    Minutes of Exercise per Session: 0 min  Stress: No Stress Concern Present (09/15/2023)   [employer] of Occupational Health - Occupational Stress Questionnaire    Feeling of Stress : Not at all  Social Connections: Unknown (10/29/2023)   Social Connection and Isolation Panel    Frequency of Communication with Friends and Family: More than three times a week    Frequency of Social Gatherings with Friends and Family: More than three times a week    Attends Religious Services: Never    Database administrator or Organizations: No    Attends Banker Meetings: Never    Marital Status: Not on file  Intimate Partner Violence: Not At Risk (10/29/2023)   Humiliation, Afraid, Rape, and Kick questionnaire    Fear of Current or Ex-Partner: No    Emotionally Abused: No    Physically Abused: No    Sexually Abused: No     Family History  Problem Relation Age of Onset   Diabetes Mellitus II Mother    Kidney disease Mother    Diabetes Mellitus II Father    CAD Father    Cancer Father        prostate   Kidney disease Father    Diabetes Mellitus II Brother    Kidney disease Brother    Diabetes Mellitus II Brother    Stomach cancer Brother 50   Kidney disease Brother    Colon cancer Neg Hx    Colon polyps Neg Hx    Esophageal cancer Neg Hx    Rectal cancer Neg Hx    Pancreatic cancer Neg Hx     ROS: Otherwise negative unless  mentioned in HPI  Physical Examination  Vitals:   10/29/23 0430 10/29/23 0750  BP: (!) 152/70 (!) 151/81  Pulse: 74 78  Resp: 20 19  Temp:  97.7 F (36.5 C)  SpO2: 92% 100%   Body mass index is 27.67 kg/m.  General:  WDWN in NAD Gait: Not observed HENT: WNL, normocephalic Pulmonary: normal non-labored breathing, without Rales, rhonchi,  wheezing Cardiac: regular Abdomen:  soft, NT/ND, no masses Skin:  without rashes Vascular Exam/Pulses: 1+ palpable left radial pulse Extremities: Pulsatile flow throughout fistula in the left upper arm Musculoskeletal: no muscle wasting or atrophy  Neurologic: A&O X 3;  No focal weakness or paresthesias are detected; speech is fluent/normal Psychiatric:  The pt has Normal affect. Lymph:  Unremarkable  CBC    Component Value Date/Time   WBC 8.7 10/28/2023 2109   RBC 4.57 10/28/2023 2109   HGB 11.9 (L) 10/28/2023 2109   HGB 8.3 (L) 03/24/2020 1203   HCT 39.5 10/28/2023 2109   HCT 25.7 (L) 03/24/2020 1203   PLT 122 (L) 10/28/2023 2109   PLT 108 (L) 03/24/2020 1203   MCV 86.4 10/28/2023 2109   MCV 86 03/24/2020 1203   MCH 26.0 10/28/2023 2109   MCHC 30.1 10/28/2023 2109   RDW 18.7 (H) 10/28/2023 2109   RDW 15.7 (H) 03/24/2020 1203   LYMPHSABS 0.4 (L) 09/11/2023 2015   LYMPHSABS 0.5 (L) 03/24/2020 1203   MONOABS 0.5 09/11/2023 2015   EOSABS 0.2 09/11/2023 2015   EOSABS 0.1 03/24/2020 1203   BASOSABS 0.1 09/11/2023 2015   BASOSABS 0.0 03/24/2020 1203    BMET    Component Value Date/Time   NA 139 10/28/2023 2109   NA 138 03/24/2020 1203   K 5.1 10/28/2023 2109   CL 98 10/28/2023 2109   CO2 24 10/28/2023 2109   GLUCOSE 97 10/28/2023 2109   BUN 31 (H) 10/28/2023 2109   BUN 27 03/24/2020 1203   CREATININE 4.56 (H) 10/28/2023 2109   CREATININE 5.97 (H) 02/28/2020 1522   CALCIUM  8.8 (L) 10/28/2023 2109   GFRNONAA 13 (L) 10/28/2023 2109   GFRAA 15 (L) 03/24/2020 1203    COAGS: Lab Results  Component Value Date   INR 1.4 (H) 05/04/2023   INR 1.5 (H) 10/11/2022   INR 1.6 (H) 09/09/2022      ASSESSMENT/PLAN: This is a 73 y.o. male with left hand edema  Mr. Clauson is a 73 year old male with a left arm brachiobasilic fistula.  He has an edematous left hand and is on the schedule for fistulogram on 11/07/2023 with Dr. Gretta.  Edema is likely related to recurrent outflow vein stenosis.  Fortunately he has no grip strength deficit or pain  in his hand that would indicate steal syndrome.  Ok to continue HD via left arm AV fistula and we will keep his appointment for outpatient left arm fistulogram in August.  Nothing further to add from a vascular surgery standpoint.  On-call vascular surgeon Dr. Magda was also involved in the evaluation management plan of this patient.   Donnice Sender PA-C [facility] [phone]  VASCULAR STAFF ADDENDUM: I have independently interviewed and examined the patient. I agree with the above.  Plan outpatient fistulagram to evaluate LUE swelling. OK to use fistula. Please call for questions.  Debby SAILOR. Magda, MD [facility] Phone Number: [phone] 10/29/2023 8:17 PM

## 2023-10-29 NOTE — Progress Notes (Signed)
 Unable to obtain ABG & patient refuses to be stuck again. Provider was made aware.

## 2023-10-29 NOTE — ED Notes (Signed)
Patient placed on 2LNC at this time due to desaturations while sleeping. RN aware.

## 2023-10-29 NOTE — Plan of Care (Addendum)
 [facility] emergency department [place] progressive unit transfer;  73 year old man past medical history of ESRD on dialysis TTS schedule, hypothyroidism, chronic thrombocytopenia history of CAD status post CABG, DM type II, osteomyelitis s/p bilateral BKA, history of GI bleed and GERD present emergency complaining of cough and shortness of breath. The patient states that he has been feeling short of breath with a cough during this time.  Denies any associated chest pain.  He denies any hemoptysis.  He states that this feels similar to when he has had pneumonia in the past.  Denies any history of asthma or COPD.  He came to the ER today for further evaluation regarding this states due to ongoing symptoms.   At presentation to ED patient found hypertensive blood pressure 191/72, O2 sat 82% room air and tachypneic.  Afebrile. Pittore panel negative for COVID, RSV flu Blood cultures are pending. Normal lactic acid level. In proBNP >35,000 CBC no evidence of leukocytosis, stable H&H and low platelet count 122. BMP showing evidence of ESRD.  Chest x-ray show hyperinflation with patchy airspace disease concern for atelectasis pneumonia.  In the ED patient received ceftriaxone  and doxycycline .  Also DuoNeb nebulizer. Patient has dialysis on Tuesday at 7/22.  Dr. Loretta reported that patient is not volume overloaded concern for elevated BNP in the setting of ESRD patient.  Hospitalist has been consulted acute hypoxic respiratory failure in the setting of pneumonia and elevated BNP.  Patient will probably need an echocardiogram.  [facility] will assume care on arrival to accepting facility. Until arrival, care as per EDP. However, [facility] available 24/7 for questions and assistance. Check [email or web address] for on-call coverage. Nursing staff, please call [facility] Admits & Consults System-Wide number under Amion on patient's arrival so appropriate admitting provider can evaluate the pt.   Author: Cariah Salatino, MD   [facility]

## 2023-10-29 NOTE — H&P (Signed)
 History and Physical    Patient: [name] [name] DOB: 1950-09-09 DOA: 10/28/2023 DOS: the patient was seen and examined on 10/29/2023 PCP: [name], NP  Patient coming from: Home  Chief Complaint:  Chief Complaint  Patient presents with   Cough   HPI: [name] is a 73 y.o. male with medical history significant of permanent atrial fibrillation not on anticoagulation secondary to GU bleeding, anemia, prior CABG, chronic thrombocytopenia, bilateral BKA, hypertension, hypothyroidism, diabetes mellitus, chronic kidney disease on dialysis TTS.  Scented to the ED because of shortness of breath.  Reported a cough that began 24 hours prior to arrival that was nonproductive denied fever or chills.  On exam was found to have significant edema involving the thighs and lower abdomen.  This edema is pitting in nature.  Patient states this has been ongoing for several weeks.  He reports he did stay for his entire dialysis treatment yesterday 7/22.  While in the ER patient has remained afebrile.  Vital signs were stable.  He initially required 3 L nasal cannula O2 oxygen  blood pressure has remained elevated.  proBNP greater than 35,000 in context of chronic kidney disease on dialysis.  Lactic acid normal.  No leukocytosis.  PCR for COVID, influenza and RSV negative.  Chest x-ray demonstrated hypoventilatory changes with patchy airspace opacities in the bases likely representing atelectasis or pneumonia.  EDP has initially treated the patient with antibiotics due to concerns over pneumonia.  Hospital service has been consulted to evaluate the patient for admission.  During our evaluation of the patient he was somewhat sleepy but was able to answer questions appropriately.  He was mildly confused stating he has been at the hospital for 2 days when he has been here less than 24 hours including at the freestanding ED that he presented to.  An ABG was attempted but patient did not  tolerate.  Review of Systems: As mentioned in the history of present illness. All other systems reviewed and are negative. Past Medical History:  Diagnosis Date   Acute osteomyelitis of metatarsal bone of left foot (HCC)    Ambulates with cane    straight cane   Anemia    Cervical myelopathy (HCC) 02/06/2018   Chronic kidney disease    dailysis M W F- home   Community acquired pneumonia of left lower lobe of lung 08/18/2021   Complication of anesthesia    Coronary artery disease    Dehiscence of amputation stump of left lower extremity (HCC)    Diabetes mellitus without complication (HCC)    type2   Diabetic foot ulcer (HCC)    Diabetic neuropathy (HCC) 02/06/2018   Dysrhythmia    Gait abnormality 08/24/2018   GERD (gastroesophageal reflux disease)     01/06/20- not current   Gout    History of blood transfusion    History of blood transfusion    History of kidney stones    passed stones   Hypercholesteremia    Hypertension    Hypothyroidism    Neuromuscular disorder (HCC)    neuropathy left leg and bilateral feet   Neuropathy    Partial nontraumatic amputation of foot, 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   PONV (postoperative nausea and vomiting)    Prostate cancer (HCC)    PVD (peripheral vascular disease) (HCC)    with amputations   Past Surgical History:  Procedure Laterality Date   A-FLUTTER ABLATION N/A 04/06/2020   Procedure: A-FLUTTER ABLATION;  Surgeon: Waddell Danelle ORN, MD;  Location: [facility] INVASIVE CV LAB;  Service: Cardiovascular;  Laterality: N/A;   A/V FISTULAGRAM N/A 03/05/2023   Procedure: A/V Fistulagram;  Surgeon: Pearline [name] RAMAN, MD;  Location: [facility] INVASIVE CV LAB;  Service: Vascular;  Laterality: N/A;   AMPUTATION Left 12/25/2013   Procedure: AMPUTATION RAY LEFT 5TH RAY;  Surgeon: Jerona Harden GAILS, MD;  Location: [facility] ORS;  Service: Orthopedics;  Laterality: Left;   AMPUTATION Right 12/15/2018   Procedure: AMPUTATION OF 4TH AND 5TH TOES RIGHT FOOT;  Surgeon: Harden Jerona GAILS, MD;  Location: [facility] OR;  Service: Orthopedics;  Laterality: Right;   AMPUTATION Left 02/02/2021   Procedure: LEFT FOOT 4TH RAY AMPUTATION;  Surgeon: Harden Jerona GAILS, MD;  Location: [facility] OR;  Service: Orthopedics;  Laterality: Left;   AMPUTATION Left 05/09/2021   Procedure: LEFT BELOW KNEE AMPUTATION;  Surgeon: Harden Jerona GAILS, MD;  Location: [facility] OR;  Service: Orthopedics;  Laterality: Left;   AMPUTATION Right 07/26/2022   Procedure: RIGHT BELOW KNEE AMPUTATION;  Surgeon: Harden Jerona GAILS, MD;  Location: [facility] OR;  Service: Orthopedics;  Laterality: Right;   APPLICATION OF WOUND VAC Right 12/15/2018   Procedure: APPLICATION OF WOUND VAC;  Surgeon: Harden Jerona GAILS, MD;  Location: [facility] OR;  Service: Orthopedics;  Laterality: Right;   AV FISTULA PLACEMENT Left 03/25/2019   Procedure: LEFT ARM ARTERIOVENOUS (AV) FISTULA CREATION;  Surgeon: Serene Gaile ORN, MD;  Location: [facility] OR;  Service: Vascular;  Laterality: Left;   BACK SURGERY     BASCILIC VEIN TRANSPOSITION Left 05/20/2019   Procedure: SECOND STAGE LEFT BASCILIC VEIN TRANSPOSITION;  Surgeon: Serene Gaile ORN, MD;  Location: [facility] OR;  Service: Vascular;  Laterality: Left;   BIOPSY  06/22/2022   Procedure: BIOPSY;  Surgeon: Wilhelmenia Aloha Raddle., MD;  Location: [facility] ENDOSCOPY;  Service: Gastroenterology;;   CARDIAC CATHETERIZATION  02/17/2014   CATARACT EXTRACTION Bilateral    CHOLECYSTECTOMY     COLONOSCOPY  2011   in [place], Normal   COLONOSCOPY N/A 06/23/2022   Procedure: COLONOSCOPY;  Surgeon: Legrand Victory LITTIE DOUGLAS, MD;  Location: [facility] ENDOSCOPY;  Service: Gastroenterology;  Laterality: N/A;   CORONARY ARTERY BYPASS GRAFT  2008   CYSTOSCOPY N/A 06/29/2020   Procedure: CYSTOSCOPY WITH CLOT EVACUATION AND  FULGERATION;  Surgeon: Matilda Senior, MD;  Location: [facility] OR;  Service: Urology;  Laterality: N/A;   CYSTOSCOPY WITH FULGERATION Bilateral 06/17/2020   Procedure: CYSTOSCOPY,BILATERAL RETROGRADE, CLOT EVACUATION WITH FULGERATION OF THE BLADDER;   Surgeon: Selma Donnice SAUNDERS, MD;  Location: [facility] OR;  Service: Urology;  Laterality: Bilateral;   CYSTOSCOPY WITH FULGERATION N/A 06/28/2020   Procedure: CYSTOSCOPY WITH CLOT EVACUATION AND FULGERATION OF BLEEDERS;  Surgeon: Matilda Senior, MD;  Location: [facility] OR;  Service: Urology;  Laterality: N/A;  1 HR   ENTEROSCOPY N/A 06/22/2022   Procedure: ENTEROSCOPY;  Surgeon: Wilhelmenia Aloha Raddle., MD;  Location: [facility] ENDOSCOPY;  Service: Gastroenterology;  Laterality: N/A;   HEMOSTASIS CLIP PLACEMENT  06/22/2022   Procedure: HEMOSTASIS CLIP PLACEMENT;  Surgeon: Wilhelmenia Aloha Raddle., MD;  Location: [facility] ENDOSCOPY;  Service: Gastroenterology;;   HOT HEMOSTASIS N/A 06/22/2022   Procedure: HOT HEMOSTASIS (ARGON PLASMA COAGULATION/BICAP);  Surgeon: Wilhelmenia Aloha Raddle., MD;  Location: [facility] ENDOSCOPY;  Service: Gastroenterology;  Laterality: N/A;   I & D EXTREMITY Right 12/15/2018   Procedure: DEBRIDEMENT RIGHT FOOT;  Surgeon: Harden Jerona GAILS, MD;  Location: [facility] OR;  Service: Orthopedics;  Laterality: Right;   I & D EXTREMITY Right 08/27/2019   Procedure: PARTIAL CUBOID EXCISION RIGHT  FOOT;  Surgeon: Harden Jerona GAILS, MD;  Location: [facility] OR;  Service: Orthopedics;  Laterality: Right;   I & D EXTREMITY Right 01/07/2020   Procedure: RIGHT FOOT EXCISION INFECTED BONE;  Surgeon: Harden Jerona GAILS, MD;  Location: [facility] OR;  Service: Orthopedics;  Laterality: Right;   I & D EXTREMITY Right 05/17/2022   Procedure: IRRIGATION AND DEBRIDEMENT RIGHT FOOT ABSCESS;  Surgeon: Harden Jerona GAILS, MD;  Location: [facility] OR;  Service: Orthopedics;  Laterality: Right;   IR PARACENTESIS  10/11/2022   IR PARACENTESIS  12/24/2022   IR PARACENTESIS  02/06/2023   IR PARACENTESIS  05/05/2023   IR PARACENTESIS  05/19/2023   IR PARACENTESIS  07/11/2023   IR PARACENTESIS  07/30/2023   IR PARACENTESIS  08/20/2023   IR PARACENTESIS  09/19/2023   IR PARACENTESIS  10/03/2023   IR PARACENTESIS  10/17/2023   NECK SURGERY     novemver 2019   PERIPHERAL  VASCULAR BALLOON ANGIOPLASTY Left 03/05/2023   Procedure: PERIPHERAL VASCULAR BALLOON ANGIOPLASTY;  Surgeon: Pearline [name] RAMAN, MD;  Location: [facility] INVASIVE CV LAB;  Service: Vascular;  Laterality: Left;  AVF   POLYPECTOMY  06/22/2022   Procedure: POLYPECTOMY;  Surgeon: Mansouraty, Aloha Raddle., MD;  Location: [facility] ENDOSCOPY;  Service: Gastroenterology;;   POLYPECTOMY  06/23/2022   Procedure: POLYPECTOMY;  Surgeon: Legrand Victory LITTIE DOUGLAS, MD;  Location: [facility] ENDOSCOPY;  Service: Gastroenterology;;   STUMP REVISION Left 06/27/2021   Procedure: REVISION LEFT BELOW KNEE AMPUTATION;  Surgeon: Harden Jerona GAILS, MD;  Location: [facility] OR;  Service: Orthopedics;  Laterality: Left;   SUBMUCOSAL LIFTING INJECTION  06/22/2022   Procedure: SUBMUCOSAL LIFTING INJECTION;  Surgeon: Wilhelmenia Aloha Raddle., MD;  Location: [facility] ENDOSCOPY;  Service: Gastroenterology;;   SUBMUCOSAL TATTOO INJECTION  06/22/2022   Procedure: SUBMUCOSAL TATTOO INJECTION;  Surgeon: Wilhelmenia Aloha Raddle., MD;  Location: [facility] ENDOSCOPY;  Service: Gastroenterology;;   TRANSURETHRAL RESECTION OF PROSTATE N/A 06/28/2020   Procedure: TRANSURETHRAL RESECTION OF THE PROSTATE (TURP);  Surgeon: Matilda Senior, MD;  Location: [facility] OR;  Service: Urology;  Laterality: N/A;   WISDOM TOOTH EXTRACTION     Social History:  reports that he quit smoking about 34 years ago. His smoking use included cigars. He started smoking about 50 years ago. He has never used smokeless tobacco. He reports that he does not drink alcohol  and does not use drugs.  Allergies  Allergen Reactions   Morphine  Other (See Comments)    Other reaction(s): Delusions (intolerance)   Mushroom Extract Complex (Obsolete) Nausea Only    Family History  Problem Relation Age of Onset   Diabetes Mellitus II Mother    Kidney disease Mother    Diabetes Mellitus II Father    CAD Father    Cancer Father        prostate   Kidney disease Father    Diabetes Mellitus II Brother    Kidney disease  Brother    Diabetes Mellitus II Brother    Stomach cancer Brother 56   Kidney disease Brother    Colon cancer Neg Hx    Colon polyps Neg Hx    Esophageal cancer Neg Hx    Rectal cancer Neg Hx    Pancreatic cancer Neg Hx     Prior to Admission medications   Medication Sig Start Date End Date Taking? Authorizing Provider  acetaminophen  (TYLENOL ) 325 MG tablet Take 2 tablets (650 mg total) by mouth every 4 (four) hours as needed for fever or mild pain. 08/08/22   Angiulli,  Toribio PARAS, PA-C  albuterol  (VENTOLIN  HFA) 108 (90 Base) MCG/ACT inhaler Inhale 2 puffs into the lungs every 6 (six) hours as needed for shortness of breath or wheezing. 05/13/23 11/09/23  [provider]  allopurinol  (ZYLOPRIM ) 100 MG tablet Take 2 tablets (200 mg total) by mouth daily. 08/08/22   Angiulli, Toribio PARAS, PA-C  calcitRIOL  (ROCALTROL ) 0.5 MCG capsule Take 0.5 mcg by mouth 3 (three) times a week. 02/17/23   [provider]  doxycycline  (VIBRAMYCIN ) 100 MG capsule Take 1 capsule (100 mg total) by mouth 2 (two) times daily. One po bid x 7 days 09/12/23   Geroldine Berg, MD  levothyroxine  (SYNTHROID ) 112 MCG tablet Take 1 tablet (112 mcg total) by mouth daily before breakfast. 05/22/21   Angiulli, Toribio PARAS, PA-C  Methoxy PEG-Epoetin Beta (MIRCERA IJ) Inject into the skin. 08/20/22   [provider]  modafinil  (PROVIGIL ) 100 MG tablet Take 1 tablet (100 mg total) by mouth daily. 10/06/23   Lovorn, Megan, MD  modafinil  (PROVIGIL ) 200 MG tablet Take 1 tablet by mouth once daily 10/12/23   Raulkar, Krutika P, MD  OZEMPIC , 0.25 OR 0.5 MG/DOSE, 2 MG/3ML SOPN Inject 0.25 mg into the skin once a week. 09/19/21   [provider]  pantoprazole  (PROTONIX ) 40 MG tablet Take 1 tablet (40 mg total) by mouth daily. 09/15/23   [name], NP  pregabalin  (LYRICA ) 150 MG capsule Take 1 capsule (150 mg total) by mouth 3 (three) times daily. 09/15/23   [name], NP  propranolol  (INDERAL ) 40 MG tablet  Take 40 mg by mouth daily. 03/24/23   [provider]  rosuvastatin  (CRESTOR ) 10 MG tablet Take 1 tablet (10 mg total) by mouth daily. 08/08/22   Angiulli, Toribio PARAS, PA-C  sevelamer  carbonate (RENVELA ) 800 MG tablet Take 800 mg by mouth 3 (three) times daily. 01/17/23   [provider]  torsemide  (DEMADEX ) 20 MG tablet Take 1 tablet (20 mg total) by mouth 2 (two) times daily. Patient not taking: Reported on 09/15/2023 06/16/23   Ladona Heinz, MD    Physical Exam: Vitals:   10/29/23 0430 10/29/23 9366 10/29/23 0635 10/29/23 0750  BP: (!) 152/70   (!) 151/81  Pulse: 74   78  Resp: 20   19  Temp:    97.7 F (36.5 C)  TempSrc:  Oral  Oral  SpO2: 92%   100%  Height:   5' 11 (1.803 m)    Constitutional: NAD, calm, comfortable Respiratory: Pulmonary exam limited by patient's inability to roll over or sit forward in the bed.  He does have crackles. Normal respiratory effort. No accessory muscle use.  2 L oxygen  Cardiovascular: Irregular pulse with underlying atrial fibrillation, no murmurs / rubs / gallops. No extremity edema. 2+ pedal pulses.  Abdomen: no tenderness, no masses palpated. No hepatosplenomegaly. Bowel sounds positive.  Musculoskeletal: no clubbing / cyanosis. No joint deformity upper and lower extremities. Good ROM, no contractures. Normal muscle tone.  Skin: no rashes, lesions, ulcers. No induration Neurologic: CN 2-12 grossly intact. Sensation intact, Strength 3/5 x all 4 extremities.  Psychiatric: Normal judgment and insight. Alert and oriented x 3. Normal mood.    Data Reviewed:  Sodium 139, potassium 5.1, glucose 97, BUN 31, creatinine 4.56, anion gap 16, GFR 13  Lactic acid 1.9  WBC 8700 differential not obtained, hemoglobin 11.9, platelets 122,000  Imaging as above  Blood cultures have been drawn.  Assessment and Plan: Acute hypoxemic respiratory failure: Bibasilar pneumonia Volume  overload Check procalcitonin although may not be accurate in  context of CKD-patient has not been febrile and does not have classic symptoms of pneumonia.  Symptoms seem more consistent with volume overload. Continue antibiotics Volume overload will be treated with dialysis Continue supportive care with oxygen  and wean as tolerated  CKD on dialysis TTS Appreciate assistance of nephrology team Nephrologist concurs that patient has significant volume overload Plan extra dialysis today on 7/23 given edema and hypoxemia  Recurrent outflow vein stenosis of AV fistula Patient has edema of left hand and skin changes that appear consistent with venous stasis Fistulogram scheduled for 8/1 with Dr. Gretta VVS has cleared patient to continue hemodialysis via left arm AV fistula  Permanent atrial fibrillation Currently rate controlled Not a candidate for chronic anticoagulation secondary to prior GU bleeding Continue Inderal   Hypertension Continue Inderal  Primary modality of treatment is hemodialysis on a regular basis  Diabetes mellitus 2 Previously has tried Ozempic  Follow CBGs and provide SSI Hemoglobin A1c pending  Chronic thrombocytopenia Platelets stable and near baseline noting current platelets are 112,000 and in January they were 82,000  History of CABG/peripheral vascular disease status post bilateral BKA Continue beta-blocker statin Currently asymptomatic  Hypothyroidism Continue Synthroid     Advance Care Planning:   Code Status: Full Code   VTE prophylaxis: Subcutaneous heparin   Consults: Nephrology, VVS  Family Communication: Patient only  Severity of Illness: The appropriate patient status for this patient is INPATIENT. Inpatient status is judged to be reasonable and necessary in order to provide the required intensity of service to ensure the patient's safety. The patient's presenting symptoms, physical exam findings, and initial radiographic and laboratory data in the context of their chronic comorbidities is felt to place  them at high risk for further clinical deterioration. Furthermore, it is not anticipated that the patient will be medically stable for discharge from the hospital within 2 midnights of admission.   * I certify that at the point of admission it is my clinical judgment that the patient will require inpatient hospital care spanning beyond 2 midnights from the point of admission due to high intensity of service, high risk for further deterioration and high frequency of surveillance required.*  Author: Isaiah Lever, NP 10/29/2023 11:13 AM  For on call review [email or web address].

## 2023-10-29 NOTE — Progress Notes (Signed)
 Received patient in bed to unit.  Alert and oriented.  Informed consent signed and in chart.   TX duration:3.5 hours  Patient tolerated well.  Transported back to the room  Alert, without acute distress.  Hand-off given to patient's nurse.   Access used: Left AV Fistula arm Access issues: none  Total UF removed: 3.5L Medication(s) given: none   10/29/23 1922  Vitals  Temp 97.7 F (36.5 C)  Temp Source Oral  BP (!) 116/54  Pulse Rate 98  ECG Heart Rate 95  Resp (!) 23  Oxygen  Therapy  SpO2 100 %  O2 Device Room Air  During Treatment Monitoring  Duration of HD Treatment -hour(s) 3.5 hour(s)  HD Safety Checks Performed Yes  Intra-Hemodialysis Comments Tx completed  Dialysis Fluid Bolus Normal Saline  Bolus Amount (mL) 300 mL  Post Treatment  Dialyzer Clearance Clear  Liters Processed 84  Fluid Removed (mL) 3500 mL  Tolerated HD Treatment Yes  AVG/AVF Arterial Site Held (minutes) 7 minutes  AVG/AVF Venous Site Held (minutes) 7 minutes  Fistula / Graft Left Upper arm Arteriovenous fistula  No placement date or time found.   Orientation: Left  Access Location: Upper arm  Access Type: Arteriovenous fistula  Status Deaccessed     Camellia Brasil LPN [facility] Dialysis Unit

## 2023-10-29 NOTE — TOC Initial Note (Signed)
 Transition of Care [facility]) - Initial/Assessment Note    Patient Details  Name: [name] MRN: [id number] Date of Birth: Nov 02, 1950  Transition [facility] Care [facility]) CM/SW Contact:    Waddell Barnie Rama, RN Phone Number: 10/29/2023, 3:15 PM  Clinical Narrative:                 From home with spouse, has PCP and insurance on file, states has no HH services in place at this time, has rollator and w/chair at home.  States family member will transport them home at [employer] and family is support system, states gets [facility] [address].  Pta self ambulatory.   There are no IP CM  needs identified  at this time.  Please place consult for IP CM needs.    Expected Discharge Plan: Home/Self Care Barriers to Discharge: Continued Medical Work up   Patient Goals and CMS Choice Patient states their goals for this hospitalization and ongoing recovery are:: return home   Choice offered to / list presented to : NA      Expected Discharge Plan and Services   Discharge Planning Services: CM Consult Post Acute Care Choice: NA Living arrangements for the past 2 months: Single Family Home                 DME Arranged: N/A DME Agency: NA       HH Arranged: NA          Prior Living Arrangements/Services Living arrangements for the past 2 months: Single Family Home Lives with:: Spouse Patient language and need for interpreter reviewed:: Yes Do you feel safe going back to the place where you live?: Yes      Need for Family Participation in Patient Care: Yes (Comment) Care giver support system in place?: Yes (comment) Current home services: DME (rollator, w/chair) Criminal Activity/Legal Involvement Pertinent to Current Situation/Hospitalization: No - Comment as needed  Activities of Daily Living      Permission Sought/Granted Permission sought to share information with : Case Manager Permission granted to share information with : Yes, Verbal Permission Granted               Emotional Assessment Appearance:: Appears stated age Attitude/Demeanor/Rapport: Engaged Affect (typically observed): Appropriate Orientation: : Oriented to Self, Oriented to Place, Oriented to  Time, Oriented to Situation Alcohol  / Substance Use: Not Applicable Psych Involvement: No (comment)  Admission diagnosis:  CAP (community acquired pneumonia) [J18.9] Generalized weakness [R53.1] Pneumonia due to infectious organism, unspecified laterality, unspecified part of lung [J18.9] Patient Active Problem List   Diagnosis Date Noted   CAP (community acquired pneumonia) 10/29/2023   Acute respiratory failure with hypoxia (HCC) 05/05/2023   Ascites 05/05/2023   Hyponatremia 05/05/2023   Right below-knee amputee (HCC) 07/29/2022   Sepsis due to pneumonia (HCC) 07/23/2022   Foot infection 07/22/2022   Acute respiratory failure (HCC) 07/22/2022   Anemia due to chronic blood loss 06/23/2022   Heme positive stool 06/23/2022   Benign neoplasm of cecum 06/23/2022   Benign neoplasm of transverse colon 06/23/2022   Benign neoplasm of descending colon 06/23/2022   Abscess of right foot 05/17/2022   Contraindication to anticoagulation therapy GU Bleed 03/13/2022   Permanent atrial fibrillation (HCC) 08/18/2021   Anemia of chronic renal failure 08/18/2021   Thrombocytopenia (HCC) 08/18/2021   Acute metabolic encephalopathy 08/18/2021   Action induced myoclonus 08/18/2021   Left below-knee amputee (HCC) 05/11/2021   Anemia of chronic disease 09/13/2020  COVID-19 virus infection 09/13/2020   Pressure injury of skin 06/29/2020   Gross hematuria 06/16/2020   Typical atrial flutter (HCC) 03/24/2020   Bilateral pleural effusion 02/24/2020   Healthcare maintenance 01/28/2020   Shortness of breath 01/28/2020   History of partial ray amputation of fourth toe of right foot (HCC) 09/08/2019   Chronic cough 06/25/2019   Cutaneous abscess of right foot    Subacute osteomyelitis, right ankle  and foot (HCC) 12/14/2018   AKI (acute kidney injury) (HCC) 12/14/2018   ESRD on dialysis (HCC) 12/14/2018   S/P CABG (coronary artery bypass graft) 12/11/2018   Gait abnormality 08/24/2018   Diabetic neuropathy (HCC) 02/06/2018   Cervical myelopathy (HCC) 02/06/2018   Onychomycosis 10/30/2017   Other spondylosis with radiculopathy, cervical region 01/27/2017   Midfoot ulcer, right, limited to breakdown of skin (HCC) 11/15/2016   Lateral epicondylitis, left elbow 08/12/2016   Prostate cancer (HCC) 06/19/2016   Cellulitis of right foot 12/24/2015   Gout 09/14/2012   Essential hypertension 09/14/2012   Hypothyroidism 09/14/2012   CAD (coronary artery disease) 09/14/2012   Insulin  dependent type 2 diabetes mellitus (HCC) 09/14/2012   Hyperlipidemia associated with type 2 diabetes mellitus (HCC)    Neuropathy (HCC)    PCP:  [name], NP Pharmacy:   [phone] [place], [place] - [address] AT [address] OF [facility] [address] [place] [address] Phone: [phone] Fax: [phone]  [id number] - [place], [place] - [address] [place] [address] Phone: [phone] Fax: [phone]     Social Drivers of Health (SDOH) Social History: SDOH Screenings   Food Insecurity: No Food Insecurity (10/29/2023)  Housing: Low Risk  (10/29/2023)  Transportation Needs: No Transportation Needs (10/29/2023)  Utilities: Not At Risk (10/29/2023)  Depression (PHQ2-9): High Risk (09/15/2023)  Financial Resource Strain: Low Risk  (09/15/2023)  Physical Activity: Inactive (09/15/2023)  Social Connections: Unknown (10/29/2023)  Stress: No Stress Concern Present (09/15/2023)  Tobacco Use: Medium Risk (10/28/2023)  Health Literacy: Adequate Health Literacy (09/15/2023)   SDOH Interventions:     Readmission Risk Interventions    10/29/2023    3:12 PM 07/29/2022   10:39 AM  Readmission Risk Prevention Plan   Transportation Screening Complete Complete  PCP or Specialist Appt within 3-5 Days Complete   HRI or Home Care Consult Complete   Palliative Care Screening Not Applicable   Medication Review (RN Care Manager) Complete   PCP or Specialist appointment within 3-5 days of discharge  Complete  SW Recovery Care/Counseling Consult  Complete  Skilled Nursing Facility  Not Applicable

## 2023-10-29 NOTE — ED Notes (Signed)
 Report called to [place] [facility] and given to [facility]

## 2023-10-30 ENCOUNTER — Inpatient Hospital Stay (HOSPITAL_COMMUNITY)

## 2023-10-30 ENCOUNTER — Other Ambulatory Visit (HOSPITAL_COMMUNITY)

## 2023-10-30 DIAGNOSIS — J189 Pneumonia, unspecified organism: Secondary | ICD-10-CM | POA: Diagnosis not present

## 2023-10-30 DIAGNOSIS — E8770 Fluid overload, unspecified: Secondary | ICD-10-CM | POA: Diagnosis not present

## 2023-10-30 DIAGNOSIS — J9601 Acute respiratory failure with hypoxia: Secondary | ICD-10-CM | POA: Diagnosis not present

## 2023-10-30 HISTORY — PX: IR PARACENTESIS: IMG2679

## 2023-10-30 LAB — CBC WITH DIFFERENTIAL/PLATELET
Abs Immature Granulocytes: 0.05 K/uL (ref 0.00–0.07)
Basophils Absolute: 0.1 K/uL (ref 0.0–0.1)
Basophils Relative: 1 %
Eosinophils Absolute: 0.1 K/uL (ref 0.0–0.5)
Eosinophils Relative: 3 %
HCT: 36.1 % — ABNORMAL LOW (ref 39.0–52.0)
Hemoglobin: 11.1 g/dL — ABNORMAL LOW (ref 13.0–17.0)
Immature Granulocytes: 1 %
Lymphocytes Relative: 6 %
Lymphs Abs: 0.3 K/uL — ABNORMAL LOW (ref 0.7–4.0)
MCH: 26.5 pg (ref 26.0–34.0)
MCHC: 30.7 g/dL (ref 30.0–36.0)
MCV: 86.2 fL (ref 80.0–100.0)
Monocytes Absolute: 0.5 K/uL (ref 0.1–1.0)
Monocytes Relative: 11 %
Neutro Abs: 3.8 K/uL (ref 1.7–7.7)
Neutrophils Relative %: 78 %
Platelets: 120 K/uL — ABNORMAL LOW (ref 150–400)
RBC: 4.19 MIL/uL — ABNORMAL LOW (ref 4.22–5.81)
RDW: 18.5 % — ABNORMAL HIGH (ref 11.5–15.5)
WBC: 4.8 K/uL (ref 4.0–10.5)
nRBC: 0 % (ref 0.0–0.2)

## 2023-10-30 LAB — COMPREHENSIVE METABOLIC PANEL WITH GFR
ALT: 10 U/L (ref 0–44)
AST: 19 U/L (ref 15–41)
Albumin: 2.4 g/dL — ABNORMAL LOW (ref 3.5–5.0)
Alkaline Phosphatase: 161 U/L — ABNORMAL HIGH (ref 38–126)
Anion gap: 15 (ref 5–15)
BUN: 26 mg/dL — ABNORMAL HIGH (ref 8–23)
CO2: 26 mmol/L (ref 22–32)
Calcium: 8.4 mg/dL — ABNORMAL LOW (ref 8.9–10.3)
Chloride: 97 mmol/L — ABNORMAL LOW (ref 98–111)
Creatinine, Ser: 4.14 mg/dL — ABNORMAL HIGH (ref 0.61–1.24)
GFR, Estimated: 14 mL/min — ABNORMAL LOW (ref >60)
Glucose, Bld: 132 mg/dL — ABNORMAL HIGH (ref 70–99)
Potassium: 4.2 mmol/L (ref 3.5–5.1)
Sodium: 138 mmol/L (ref 135–145)
Total Bilirubin: 1.1 mg/dL (ref 0.0–1.2)
Total Protein: 5.3 g/dL — ABNORMAL LOW (ref 6.5–8.1)

## 2023-10-30 LAB — BODY FLUID CELL COUNT WITH DIFFERENTIAL
Eos, Fluid: 1 %
Lymphs, Fluid: 44 %
Monocyte-Macrophage-Serous Fluid: 44 % — ABNORMAL LOW (ref 50–90)
Neutrophil Count, Fluid: 11 % (ref 0–25)
Total Nucleated Cell Count, Fluid: 155 uL (ref 0–1000)

## 2023-10-30 LAB — GLUCOSE, CAPILLARY
Glucose-Capillary: 97 mg/dL (ref 70–99)
Glucose-Capillary: 107 mg/dL — ABNORMAL HIGH (ref 70–99)
Glucose-Capillary: 97 mg/dL (ref 70–99)
Glucose-Capillary: 110 mg/dL — ABNORMAL HIGH (ref 70–99)

## 2023-10-30 LAB — HEPATITIS B SURFACE ANTIBODY, QUANTITATIVE: Hep B S AB Quant (Post): <3.5 m[IU]/mL — ABNORMAL LOW

## 2023-10-30 LAB — ALBUMIN, PLEURAL OR PERITONEAL FLUID: Albumin, Fluid: <1.5 g/dL

## 2023-10-30 LAB — PHOSPHORUS: Phosphorus: 3.6 mg/dL (ref 2.5–4.6)

## 2023-10-30 LAB — MAGNESIUM: Magnesium: 1.6 mg/dL — ABNORMAL LOW (ref 1.7–2.4)

## 2023-10-30 MED ORDER — HYDROCOD POLI-CHLORPHE POLI ER 10-8 MG/5ML PO SUER: 5.0000 mL | Freq: Two times a day (BID) | ORAL | Status: DC

## 2023-10-30 MED ORDER — MAGNESIUM SULFATE 2 GM/50ML IV SOLN
2.0000 g | Freq: Once | INTRAVENOUS | Status: AC
  Administered 2023-10-30: 2 g via INTRAVENOUS
  Filled 2023-10-30: qty 50

## 2023-10-30 MED ORDER — BENZONATATE 100 MG PO CAPS
100.0000 mg | ORAL_CAPSULE | Freq: Three times a day (TID) | ORAL | Status: DC | PRN
  Administered 2023-10-30 – 2023-10-31 (×3): 100 mg via ORAL
  Filled 2023-10-30 (×4): qty 1

## 2023-10-30 MED ORDER — LIDOCAINE-EPINEPHRINE 1 %-1:100000 IJ SOLN
20.0000 mL | Freq: Once | INTRAMUSCULAR | Status: AC
  Administered 2023-10-30: 10 mL via INTRADERMAL

## 2023-10-30 MED ORDER — LIDOCAINE-EPINEPHRINE 1 %-1:100000 IJ SOLN
INTRAMUSCULAR | Status: AC
  Filled 2023-10-30: qty 1

## 2023-10-30 MED ORDER — LIDOCAINE HCL 1 % IJ SOLN
INTRAMUSCULAR | Status: AC
Start: 2023-10-30 — End: 2023-10-30
  Filled 2023-10-30: qty 20

## 2023-10-30 MED ORDER — CHLORHEXIDINE GLUCONATE CLOTH 2 % EX PADS
6.0000 | MEDICATED_PAD | Freq: Every day | CUTANEOUS | Status: DC
  Administered 2023-10-31: 6 via TOPICAL

## 2023-10-30 NOTE — Progress Notes (Signed)
 PROGRESS NOTE    [name]  FMW:[id number] DOB: 01-Jan-1951 DOA: 10/28/2023 PCP: [name], NP   Brief Narrative:  The patient is a 73 year old Caucasian male with a past medical history significant for but not limited to permanent atrial fibrillation not on anticoagulation due to GI bleed, history of chronic anemia, prior CABG, thrombocytopenia, bilateral BKA's, hypertension, hypothyroidism, diabetes mellitus type 2, ESRD on dialysis Tuesday Thursday Saturday presented to the ED because of shortness of breath. He states that he had a cough that began about 24 hours prior to admission and admitted that he was having some edema in his thighs. He states this has been going on for weeks and he went to his dialysis treatment yesterday. Feels okay but continues to complain of worsening cough. Currently being admitted for acute hypoxic respiratory failure with bibasilar pneumonia and volume overload. Nephrology has been consulted. Given that he had recurrent outflow vein stenosis of his AV fistula vascular was also consulted.   **Interim History Patient was taken for dialysis and continues to be somewhat volume overloaded.  Given his volume overload we will pursue a abdominal ultrasound and paracentesis.  He gets paracentesis quite frequently and his last one was 2 weeks ago.  He underwent successful paracentesis today and removed 5.6 L. Continues to have a cough.  Plan is for dialysis again tomorrow.  Respiratory status is slowly improving.  Assessment and Plan:  Acute respiratory failure with hypoxia in the setting of suspected pneumonia and volume overload from CHF.  Does not have a fever but does have a cough and this is more likely with volume overload however will rule out infection and continue doxycycline  100 mg p.o. twice daily.  Given volume overload nephrology been consulted for maintenance of hemodialysis and volume maintenance. Chest x-ray this AM showed Bandlike densities  compatible with scarring or atelectasis in the right mid to lower lung.Prior CABG. Degenerative glenohumeral arthropathy, left greater than right. Spurring of the right AC joint. Will check Ambulatory Home O2 Screen Continue monitor respiratory status carefully and will give him a pulmonary toilet and flutter valve, incentive spirometer, guaifenesin  and place him on DuoNebs. SpO2: 96 % O2 Flow Rate (L/min): 3 L/min -CTM Respiratory Status Carefully and Wean O2. Will need an Ambulatory Home O2 screen prior to D/C - Needs to have a cough so we will provide him with antitussives including Tussionex and benzonatate    ESRD on hemodialysis Tuesday Thursday Saturday: Nephrology consulted and plans for dialysis later this evening given his edema hypoxemia.-BUN/Cr Trend: Recent Labs  Lab 10/28/23 2109 10/30/23 0229  BUN 31* 26*  CREATININE 4.56* 4.14*  -C/w Sevelamer  Carbonate 800 mg po TIDwm and Calcitriol  0.5 mcg po 3x weekly  -Avoid Nephrotoxic Medications, Contrast Dyes, Hypotension and Dehydration to Ensure Adequate Renal Perfusion and will need to Renally Adjust Meds -Continue to Monitor and Trend Renal Function carefully and repeat CMP in the AM    Permanent atrial fibrillation: Continue to monitor telemetry and continue Inderal  but not on anticoagulation due to prior GU bleeding  Anasarca: Given his Volume Overload Paracentesis ordered and removed 5.6 Liters.  Urinalysis showed hazy appearance but did not really appear to be infected as his total nucleated cell count was under 55.  With Gram stain showing no WBCs and culture pending  Diabetes mellitus 2: Previously has tried Ozempic . Follow CBGs and provide SSI.  Hemoglobin A1c was 4.9.  Continue monitor CBGs per protocol:  Recent Labs  Lab 10/29/23 0646 10/29/23 1114 10/29/23  2159 10/30/23 0626 10/30/23 1308 10/30/23 1544  GLUCAP 82 75 147* 97 107* 97   Chronic thrombocytopenia: Platelets stable and near baseline noting current  platelets are 122,000 and in January they were 82,000. Now Plt Count Trend:  Recent Labs  Lab 10/28/23 2109 10/30/23 0229  PLT 122* 120*  -CTM for S/Sx of Bleeding; No overt bleeding noted. Repeat CBC in the Am  Normocytic Anemia/Anemia of Chronic Kidney Disease: Hgb/Hct went from 11.9/39.5 -> 11.1/36.1 w/ an MCV of 86.2. Check Anemia Panel in the AM. CTM for S/Sx of Bleeding; No overt bleeding noted. Repeat CBC in the AM  History of CABG/peripheral vascular disease status post bilateral BKA: Continue beta-blocker with propranolol  40 mg p.o. daily and rosuvastatin  10 mg p.o. daily; Currently asymptomatic   Hypothyroidism: Continue levothyroxine  112 mcg p.o. 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  GERD/GI Prophylaxis: C/w PPI with Pantoprazole  40 mg po Daily   Class I Obesity: Complicates overall prognosis and care. Estimated body mass index is 30.26 kg/m as calculated from the following:   Height as of this encounter: 5' 11 (1.803 m).   Weight as of this encounter: 98.4 kg. Weight Loss and Dietary Counseling given    DVT prophylaxis: heparin  injection 5,000 Units Start: 10/29/23 1400    Code Status: Full Code Family Communication: No family present @ bedside  Disposition Plan:  Level of care: Progressive Status is: Inpatient Remains inpatient appropriate because: Needs further clinical improvement in his condition and clearance by the nephrology team   Consultants:  Nephrology  Procedures:  Dialysis  Antimicrobials:  Anti-infectives (From admission, onward)    Start     Dose/Rate  Route Frequency Ordered Stop   10/29/23 1000  doxycycline  (VIBRA -TABS) tablet 100 mg        100 mg Oral 2 times daily 10/29/23 0849     10/28/23 2315  cefTRIAXone  (ROCEPHIN ) 2 g in sodium chloride  0.9 % 100 mL IVPB        2 g 200 mL/hr over 30 Minutes Intravenous  Once 10/28/23 2304 10/28/23 2352   10/28/23 2315  doxycycline  (VIBRA -TABS) tablet 100 mg        100 mg Oral  Once 10/28/23 2304 10/28/23 2317       Subjective: Seen and examined at bedside and he was doing okay and sitting up in the chair but still complaining of a cough.  No nausea or vomiting.  Diarrhea lightheadedness or dizziness.  Asking about his paracentesis that he gets every few weeks.  No other concerns or complaints at this time.  Objective: Vitals:   10/30/23 1132 10/30/23 1207 10/30/23 1550 10/30/23 1925  BP: 116/61 (!) 114/48 126/61 (!) 133/56  Pulse:   60 64  Resp:  20 18 20   Temp:   98.4 F (36.9 C) 98.3 F (36.8 C)  TempSrc:   Oral Oral  SpO2:  95% 96% 95%  Weight:      Height:        Intake/Output Summary (Last 24 hours) at 10/30/2023 1942 Last data filed at 10/30/2023 1817 Gross per 24  hour  Intake 780 ml  Output 50 ml  Net 730 ml   Filed Weights   10/29/23 1518 10/29/23 1945  Weight: 101.9 kg 98.4 kg   Examination: Physical Exam:  Constitutional: WN/WD, obese Caucasian male in no acute distress Respiratory: Diminished to auscultation bilaterally with some coarse breath sounds and has some crackles and some slight rhonchi but no appreciable wheezing or rales.  Has a wet sounding cough.. Normal respiratory effort and patient is not tachypenic. No accessory muscle use.  Cardiovascular: RRR, no murmurs / rubs / gallops. S1 and S2 auscultated.  Has edema in his thighs and anasarca in his abdomen Abdomen: Soft, non-tender, distended secondary to body habitus and due to fluid.  Bowel sounds positive.  GU: Deferred. Musculoskeletal: No clubbing / cyanosis of digits/nails.  Patient has bilateral  BKA's Skin: No rashes, lesions, ulcers on limited skin evaluation. No induration; Warm and dry.  Neurologic: CN 2-12 grossly intact with no focal deficits. Romberg sign and cerebellar reflexes not assessed.  Psychiatric: Normal judgment and insight. Alert and oriented x 3. Normal mood and appropriate affect.   Data Reviewed: I have personally reviewed following labs and imaging studies  CBC: Recent Labs  Lab 10/28/23 2109 10/30/23 0229  WBC 8.7 4.8  NEUTROABS  --  3.8  HGB 11.9* 11.1*  HCT 39.5 36.1*  MCV 86.4 86.2  PLT 122* 120*   Basic Metabolic Panel: Recent Labs  Lab 10/28/23 2109 10/30/23 0229  NA 139 138  K 5.1 4.2  CL 98 97*  CO2 24 26  GLUCOSE 97 132*  BUN 31* 26*  CREATININE 4.56* 4.14*  CALCIUM  8.8* 8.4*  MG  --  1.6*  PHOS  --  3.6   GFR: Estimated Creatinine Clearance: 18.7 mL/min (A) (by C-G formula based on SCr of 4.14 mg/dL (H)). Liver Function Tests: Recent Labs  Lab 10/30/23 0229  AST 19  ALT 10  ALKPHOS 161*  BILITOT 1.1  PROT 5.3*  ALBUMIN  2.4*   No results for input(s): LIPASE, AMYLASE in the last 168 hours. No results for input(s): AMMONIA in the last 168 hours. Coagulation Profile: No results for input(s): INR, PROTIME in the last 168 hours. Cardiac Enzymes: No results for input(s): CKTOTAL, CKMB, CKMBINDEX, TROPONINI in the last 168 hours. BNP (last 3 results) Recent Labs    10/28/23 2109  PROBNP >35,000.0*   HbA1C: Recent Labs    10/29/23 0930  HGBA1C 4.9   CBG: Recent Labs  Lab 10/29/23 1114 10/29/23 2159 10/30/23 0626 10/30/23 1308 10/30/23 1544  GLUCAP 75 147* 97 107* 97   Lipid Profile: No results for input(s): CHOL, HDL, LDLCALC, TRIG, CHOLHDL, LDLDIRECT in the last 72 hours. Thyroid  Function Tests: No results for input(s): TSH, T4TOTAL, FREET4, T3FREE, THYROIDAB in the last 72 hours. Anemia Panel: No results for input(s): VITAMINB12, FOLATE, FERRITIN, TIBC,  IRON, RETICCTPCT in the last 72 hours. Sepsis Labs: Recent Labs  Lab 10/28/23 2109 10/29/23 1311  PROCALCITON  --  0.65  LATICACIDVEN 1.9  --     Recent Results (from the past 240 hours)  Blood culture (routine x 2)     Status: None (Preliminary result)   Collection Time: 10/28/23  8:36 PM   Specimen: BLOOD RIGHT FOREARM  Result Value Ref Range Status   Specimen Description   Final    BLOOD RIGHT FOREARM Performed at [facility], [address], [place], [place] [address]    Special Requests   Final    BOTTLES  DRAWN AEROBIC AND ANAEROBIC Blood Culture results may not be optimal due to an inadequate volume of blood received in culture bottles Performed at [facility], [address] [place], [place] [address]    Culture   Final    NO GROWTH 1 DAY Performed at [facility], [address], [place], [place] [address]    Report Status PENDING  Incomplete  Resp panel by RT-PCR (RSV, Flu A&B, Covid) Anterior Nasal Swab     Status: None   Collection Time: 10/28/23  9:09 PM   Specimen: Anterior Nasal Swab  Result Value Ref Range Status   SARS Coronavirus 2 by RT PCR NEGATIVE NEGATIVE Final    Comment: (NOTE) SARS-CoV-2 target nucleic acids are NOT DETECTED.  The SARS-CoV-2 RNA is generally detectable in upper respiratory specimens during the acute phase of infection. The lowest concentration of SARS-CoV-2 viral copies this assay can detect is 138 copies/mL. A negative result does not preclude SARS-Cov-2 infection and should not be used as the sole basis for treatment or other patient management decisions. A negative result may occur with  improper specimen collection/handling, submission of specimen other than nasopharyngeal swab, presence of viral mutation(s) within the areas targeted by this assay, and inadequate number of viral copies(<138 copies/mL). A negative result must be combined with clinical observations, patient  history, and epidemiological information. The expected result is Negative.  Fact Sheet for Patients:  BloggerCourse.com  Fact Sheet for Healthcare Providers:  SeriousBroker.it  This test is no t yet approved or cleared by the [place]  FDA and  has been authorized for detection and/or diagnosis of SARS-CoV-2 by FDA under an Emergency Use Authorization (EUA). This EUA will remain  in effect (meaning this test can be used) for the duration of the COVID-19 declaration under Section 564(b)(1) of the Act, 21 U.S.C.section 360bbb-3(b)(1), unless the authorization is terminated  or revoked sooner.       Influenza A by PCR NEGATIVE NEGATIVE Final   Influenza B by PCR NEGATIVE NEGATIVE Final    Comment: (NOTE) The Xpert Xpress SARS-CoV-2/FLU/RSV plus assay is intended as an aid in the diagnosis of influenza from Nasopharyngeal swab specimens and should not be used as a sole basis for treatment. Nasal washings and aspirates are unacceptable for Xpert Xpress SARS-CoV-2/FLU/RSV testing.  Fact Sheet for Patients: BloggerCourse.com  Fact Sheet for Healthcare Providers: SeriousBroker.it  This test is not yet approved or cleared by the [place]  FDA and has been authorized for detection and/or diagnosis of SARS-CoV-2 by FDA under an Emergency Use Authorization (EUA). This EUA will remain in effect (meaning this test can be used) for the duration of the COVID-19 declaration under Section 564(b)(1) of the Act, 21 U.S.C. section 360bbb-3(b)(1), unless the authorization is terminated or revoked.     Resp Syncytial Virus by PCR NEGATIVE NEGATIVE Final    Comment: (NOTE) Fact Sheet for Patients: BloggerCourse.com  Fact Sheet for Healthcare Providers: SeriousBroker.it  This test is not yet approved or cleared by the [place]  FDA  and has been authorized for detection and/or diagnosis of SARS-CoV-2 by FDA under an Emergency Use Authorization (EUA). This EUA will remain in effect (meaning this test can be used) for the duration of the COVID-19 declaration under Section 564(b)(1) of the Act, 21 U.S.C. section 360bbb-3(b)(1), unless the authorization is terminated or revoked.  Performed at [facility], [address] [place], [place] [address]   Blood culture (routine x 2)     Status:  None (Preliminary result)   Collection Time: 10/29/23  7:40 AM   Specimen: BLOOD RIGHT HAND  Result Value Ref Range Status   Specimen Description BLOOD RIGHT HAND  Final   Special Requests   Final    BOTTLES DRAWN AEROBIC ONLY Blood Culture results may not be optimal due to an inadequate volume of blood received in culture bottles   Culture   Final    NO GROWTH 1 DAY Performed at [facility], [address] [place], [place] [address]    Report Status PENDING  Incomplete  Anaerobic culture w Gram Stain     Status: None (Preliminary result)   Collection Time: 10/30/23 11:47 AM   Specimen: Abdomen; Peritoneal Fluid  Result Value Ref Range Status   Specimen Description PERITONEAL  Final   Special Requests ABDOMEN  Final   Gram Stain   Final    NO WBC SEEN NO ORGANISMS SEEN Performed at [facility], [address] [place], [place] [address]    Culture PENDING  Incomplete   Report Status PENDING  Incomplete  Body fluid culture w Gram Stain     Status: None (Preliminary result)   Collection Time: 10/30/23 11:47 AM   Specimen: Abdomen; Peritoneal Fluid  Result Value Ref Range Status   Specimen Description PERITONEAL  Final   Special Requests ABDOMEN  Final   Gram Stain   Final    NO WBC SEEN NO ORGANISMS SEEN Performed at [facility], [address] [place], [place] [address]    Culture PENDING  Incomplete   Report Status PENDING  Incomplete    Radiology Studies: US  ASCITES  (ABDOMEN LIMITED) Result Date: 10/30/2023 CLINICAL DATA:  Ascites. EXAM: LIMITED ABDOMEN ULTRASOUND FOR ASCITES TECHNIQUE: Limited ultrasound survey for ascites was performed in all four abdominal quadrants. COMPARISON:  September 08, 2023. FINDINGS: Mild ascites is noted in right upper quadrant and left lower quadrant. Moderate ascites is noted in left upper and right lower quadrants. IMPRESSION: Mild to moderate ascites is noted. Electronically Signed   By: Lynwood Landy Raddle M.D.   On: 10/30/2023 14:47   IR Paracentesis Result Date: 10/30/2023 INDICATION: Patient with a history of end-stage renal disease with recurrent ascites. Interventional Radiology asked to perform a diagnostic and therapeutic paracentesis. EXAM: ULTRASOUND GUIDED PARACENTESIS MEDICATIONS: 1% lidocaine  10 mL COMPLICATIONS: None immediate. PROCEDURE: Informed written consent was obtained from the patient after a discussion of the risks, benefits and alternatives to treatment. A timeout was performed prior to the initiation of the procedure. Initial ultrasound scanning demonstrates a large amount of ascites within the left lower abdominal quadrant. The left lower abdomen was prepped and draped in the usual sterile fashion. 1% lidocaine  was used for local anesthesia. Following this, a 19 gauge, 7-cm, Yueh catheter was introduced. An ultrasound image was saved for documentation purposes. The paracentesis was performed. The catheter was removed and a dressing was applied. The patient tolerated the procedure well without immediate post procedural complication. FINDINGS: A total of approximately 5.6 L of clear yellow fluid was removed. Samples were sent to the laboratory as requested by the clinical team. IMPRESSION: Successful ultrasound-guided paracentesis yielding 5.6 liters of peritoneal fluid. Procedure performed by Warren Dais, NP Electronically Signed   By: Juliene Balder M.D.   On: 10/30/2023 14:46   DG CHEST PORT 1 VIEW Result Date:  10/30/2023 CLINICAL DATA:  Shortness of breath.  Cough and congestion. EXAM: PORTABLE CHEST 1 VIEW COMPARISON:  10/28/2023 FINDINGS:  Bandlike densities compatible with scarring or atelectasis in the right mid to lower lung. Prior CABG. Lower cervical plate and screw fixator. Heart size within normal limits for technique. Mild lower thoracic spondylosis. Degenerative glenohumeral arthropathy, left greater than right. Spurring of the right AC joint. IMPRESSION: 1. Bandlike densities compatible with scarring or atelectasis in the right mid to lower lung. 2. Prior CABG. 3. Degenerative glenohumeral arthropathy, left greater than right. Spurring of the right AC joint. Electronically Signed   By: Ryan Salvage M.D.   On: 10/30/2023 09:52   DG Chest 2 View Result Date: 10/28/2023 CLINICAL DATA:  Shortness of breath and cough EXAM: CHEST - 2 VIEW COMPARISON:  10/28/2023 FINDINGS: Post sternotomy changes. Patchy airspace disease at the bases. Normal cardiac size. No pneumothorax. Hardware in the cervicothoracic spine IMPRESSION: Hypoventilatory changes with patchy airspace opacities at the bases either representing atelectasis or pneumonia. Electronically Signed   By: Luke Bun M.D.   On: 10/28/2023 22:56   DG Chest Port 1 View Result Date: 10/28/2023 CLINICAL DATA:  sob EXAM: PORTABLE CHEST 1 VIEW COMPARISON:  CT chest 05/04/2023 FINDINGS: The heart and mediastinal contours are within normal limits. Low lung volumes with left lower mid lung zone airspace opacities. No pulmonary edema. No pleural effusion. No pneumothorax. No acute osseous abnormality.  Sternotomy wires are intact. IMPRESSION: Low lung volumes with left lower mid lung zone airspace opacities. Recommend repeat chest x-ray PA and lateral view with improved inspiratory effort for further evaluation Electronically Signed   By: Morgane  Naveau M.D.   On: 10/28/2023 21:05   Scheduled Meds:  allopurinol   200 mg Oral Daily   calcitRIOL   0.5 mcg  Oral Once per day on Monday Wednesday Friday   [START ON 10/31/2023] Chlorhexidine  Gluconate Cloth  6 each Topical Q0600   chlorpheniramine-HYDROcodone   5 mL Oral Q12H   doxycycline   100 mg Oral BID   guaiFENesin   1,200 mg Oral BID   heparin   5,000 Units Subcutaneous Q8H   insulin  aspart  0-5 Units Subcutaneous QHS   insulin  aspart  0-6 Units Subcutaneous TID WC   levothyroxine   112 mcg Oral Q0600   modafinil   200 mg Oral Daily   pantoprazole   40 mg Oral Daily   pregabalin   150 mg Oral TID   propranolol   40 mg Oral Daily   rosuvastatin   10 mg Oral Daily   sevelamer  carbonate  800 mg Oral TID with meals   sodium chloride  flush  3 mL Intravenous Q12H   Continuous Infusions:   LOS: 1 day   Alejandro Marker, DO [facility] Available via Epic secure chat 7am-7pm After these hours, please refer to coverage provider listed on [email or web address] 10/30/2023, 7:42 PM

## 2023-10-30 NOTE — Progress Notes (Signed)
 Pt receives out-pt HD at [facility] on [facility] on TTS 7:00 am chair time. Per nephrology note, staff feel pt is most appropriate for inpt HD tomorrow with possible d/c after HD if pt stable. Will assist as needed.   Randine Mungo Dialysis Navigator [phone]

## 2023-10-30 NOTE — Hospital Course (Addendum)
 The patient is a 73 year old Caucasian male with a past medical history significant for but not limited to permanent atrial fibrillation not on anticoagulation due to GI bleed, history of chronic anemia, prior CABG, thrombocytopenia, bilateral BKA's, hypertension, hypothyroidism, diabetes mellitus type 2, ESRD on dialysis Tuesday Thursday Saturday presented to the ED because of shortness of breath. He states that he had a cough that began about 24 hours prior to admission and admitted that he was having some edema in his thighs. He states this has been going on for weeks and he went to his dialysis treatment yesterday. Feels okay but continues to complain of worsening cough. Currently being admitted for acute hypoxic respiratory failure with bibasilar pneumonia and volume overload. Nephrology has been consulted. Given that he had recurrent outflow vein stenosis of his AV fistula vascular was also consulted.   **Interim History Patient was taken for dialysis and continues to be somewhat volume overloaded.  Given his volume overload we will pursue a abdominal ultrasound and paracentesis.  He gets paracentesis quite frequently and his last one was 2 weeks ago.  He underwent successful paracentesis today and removed 5.6 L. Continues to have a cough.  He was dialyzed again prior to discharge and was improved and deemed stable for discharge.  He will need to follow-up with PCP and nephrology in outpatient setting.  Assessment and Plan:  Acute respiratory failure with hypoxia in the setting of suspected pneumonia and volume overload from CHF.  Does not have a fever but does have a cough and this is more likely with volume overload however will rule out infection and continue doxycycline  100 mg p.o. twice daily.  Given volume overload nephrology been consulted for maintenance of hemodialysis and volume maintenance. Chest x-ray this AM showed Bandlike densities compatible with scarring or atelectasis in the right mid  to lower lung.Prior CABG. Degenerative glenohumeral arthropathy, left greater than right. Spurring of the right AC joint. Will check Ambulatory Home O2 Screen Continue monitor respiratory status carefully and will give him a pulmonary toilet and flutter valve, incentive spirometer, guaifenesin  and place him on DuoNebs. SpO2: 97 % O2 Flow Rate (L/min): 3 L/min -CTM Respiratory Status Carefully and Wean O2. Will need an Ambulatory Home O2 screen prior to D/C -Continues to have a cough so we will provide him with antitussives including Tussionex and benzonatate    ESRD on hemodialysis Tuesday Thursday Saturday: Nephrology consulted and plans for dialysis later this evening given his edema hypoxemia.-BUN/Cr Trend: Recent Labs  Lab 10/28/23 2109 10/30/23 0229 10/31/23 0317  BUN 31* 26* 44*  45*  CREATININE 4.56* 4.14* 5.75*  6.00*  -C/w Sevelamer  Carbonate 800 mg po TIDwm and Calcitriol  0.5 mcg po 3x weekly  -Avoid Nephrotoxic Medications, Contrast Dyes, Hypotension and Dehydration to Ensure Adequate Renal Perfusion and will need to Renally Adjust Meds -Continue to Monitor and Trend Renal Function carefully and repeat CMP in the AM    Permanent atrial fibrillation: Continue to monitor telemetry and continue Inderal  but not on anticoagulation due to prior GU bleeding  Anasarca: Given his Volume Overload Paracentesis ordered and removed 5.6 Liters.  Urinalysis showed hazy appearance but did not really appear to be infected as his total nucleated cell count was under 55.  With Gram stain showing no WBCs and culture showed no growth at 3 days  Diabetes mellitus 2: Previously has tried Ozempic . Follow CBGs and provide SSI.  Hemoglobin A1c was 4.9.  Continue monitor CBGs per protocol:  Recent Labs  Lab 10/30/23  9373 10/30/23 1308 10/30/23 1544 10/30/23 2114 10/31/23 0616 10/31/23 0735 10/31/23 1251  GLUCAP 97 107* 97 110* 136* 107* 142*   Chronic thrombocytopenia: Platelets stable and near  baseline noting current platelets are 122,000 and in January they were 82,000. Now Plt Count Trend:  Recent Labs  Lab 10/28/23 2109 10/30/23 0229 10/31/23 0317  PLT 122* 120* 91*  -CTM for S/Sx of Bleeding; No overt bleeding noted. Repeat CBC in the Am  Normocytic Anemia/Anemia of Chronic Kidney Disease: Hgb/Hct went from 11.9/39.5 -> 11.1/36.1 w/ an MCV of 86.2. Check Anemia Panel in the outpatient setting. CTM for S/Sx of Bleeding; No overt bleeding noted. Repeat CBC within 1 week  History of CABG/peripheral vascular disease status post bilateral BKA: Continue beta-blocker with propranolol  40 mg p.o. daily and rosuvastatin  10 mg p.o. daily; Currently asymptomatic   Hypothyroidism: Continue levothyroxine  112 mcg p.o. 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  GERD/GI Prophylaxis: C/w PPI with Pantoprazole  40 mg po Daily   Class I Obesity: Complicates overall prognosis and care. Estimated body mass index is 28.01 kg/m as calculated from the following:   Height as of this encounter: 5' 11 (1.803 m).   Weight as of this encounter: 91.1 kg. Weight Loss and Dietary Counseling given

## 2023-10-30 NOTE — Progress Notes (Signed)
 [facility] Progress Note   Subjective:    Seen and examined patient at bedside. Tolerated yesterday's HD with net UF 3.5L. He remains overloaded. S/p paracentesis today and 5.6L was removed. Next HD 7/25 1st shift in preparation for him to go home afterwards.  Objective Vitals:   10/30/23 0556 10/30/23 0935 10/30/23 1132 10/30/23 1207  BP: (!) 148/62 (!) 121/51 116/61 (!) 114/48  Pulse: 86 80    Resp: 20   20  Temp: 98.7 F (37.1 C)     TempSrc: Oral     SpO2: 100%   95%  Weight:      Height:       Physical Exam General: Awake, alert, NAD Heart: S1 and S2; No murmurs, gallops, or rubs Lungs: (+) fine rales L mid lobe, no wheezing Abdomen: Large, distended Extremities: LUE 2+ > RUE; 2+ edema BL thighs Dialysis Access: L AVF   [facility] Weights   10/29/23 1518 10/29/23 1945  Weight: 101.9 kg 98.4 kg    Intake/Output Summary (Last 24 hours) at 10/30/2023 1335 Last data filed at 10/30/2023 0956 Gross per 24 hour  Intake 780 ml  Output 3500 ml  Net -2720 ml    Additional Objective Labs: Basic Metabolic Panel: Recent Labs  Lab 10/28/23 2109 10/30/23 0229  NA 139 138  K 5.1 4.2  CL 98 97*  CO2 24 26  GLUCOSE 97 132*  BUN 31* 26*  CREATININE 4.56* 4.14*  CALCIUM  8.8* 8.4*  PHOS  --  3.6   Liver Function Tests: Recent Labs  Lab 10/30/23 0229  AST 19  ALT 10  ALKPHOS 161*  BILITOT 1.1  PROT 5.3*  ALBUMIN  2.4*   No results for input(s): LIPASE, AMYLASE in the last 168 hours. CBC: Recent Labs  Lab 10/28/23 2109 10/30/23 0229  WBC 8.7 4.8  NEUTROABS  --  3.8  HGB 11.9* 11.1*  HCT 39.5 36.1*  MCV 86.4 86.2  PLT 122* 120*   Blood Culture    Component Value Date/Time   SDES BLOOD RIGHT HAND 10/29/2023 0740   SPECREQUEST  10/29/2023 0740    BOTTLES DRAWN AEROBIC ONLY Blood Culture results may not be optimal due to an inadequate volume of blood received in culture bottles   CULT  10/29/2023 0740    NO GROWTH <12 HOURS Performed at  [facility], [address] [place], [place] [address]    REPTSTATUS PENDING 10/29/2023 0740    Cardiac Enzymes: No results for input(s): CKTOTAL, CKMB, CKMBINDEX, TROPONINI in the last 168 hours. CBG: Recent Labs  Lab 10/29/23 0646 10/29/23 1114 10/29/23 2159 10/30/23 0626 10/30/23 1308  GLUCAP 82 75 147* 97 107*   Iron Studies: No results for input(s): IRON, TIBC, TRANSFERRIN, FERRITIN in the last 72 hours. Lab Results  Component Value Date   INR 1.4 (H) 05/04/2023   INR 1.5 (H) 10/11/2022   INR 1.6 (H) 09/09/2022   Studies/Results: DG CHEST PORT 1 VIEW Result Date: 10/30/2023 CLINICAL DATA:  Shortness of breath.  Cough and congestion. EXAM: PORTABLE CHEST 1 VIEW COMPARISON:  10/28/2023 FINDINGS: Bandlike densities compatible with scarring or atelectasis in the right mid to lower lung. Prior CABG. Lower cervical plate and screw fixator. Heart size within normal limits for technique. Mild lower thoracic spondylosis. Degenerative glenohumeral arthropathy, left greater than right. Spurring of the right AC joint. IMPRESSION: 1. Bandlike densities compatible with scarring or atelectasis in the right mid to lower lung. 2. Prior CABG. 3. Degenerative glenohumeral arthropathy,  left greater than right. Spurring of the right AC joint. Electronically Signed   By: Ryan Salvage M.D.   On: 10/30/2023 09:52   DG Chest 2 View Result Date: 10/28/2023 CLINICAL DATA:  Shortness of breath and cough EXAM: CHEST - 2 VIEW COMPARISON:  10/28/2023 FINDINGS: Post sternotomy changes. Patchy airspace disease at the bases. Normal cardiac size. No pneumothorax. Hardware in the cervicothoracic spine IMPRESSION: Hypoventilatory changes with patchy airspace opacities at the bases either representing atelectasis or pneumonia. Electronically Signed   By: Luke Bun M.D.   On: 10/28/2023 22:56   DG Chest Port 1 View Result Date: 10/28/2023 CLINICAL DATA:  sob EXAM: PORTABLE CHEST 1 VIEW  COMPARISON:  CT chest 05/04/2023 FINDINGS: The heart and mediastinal contours are within normal limits. Low lung volumes with left lower mid lung zone airspace opacities. No pulmonary edema. No pleural effusion. No pneumothorax. No acute osseous abnormality.  Sternotomy wires are intact. IMPRESSION: Low lung volumes with left lower mid lung zone airspace opacities. Recommend repeat chest x-ray PA and lateral view with improved inspiratory effort for further evaluation Electronically Signed   By: Morgane  Naveau M.D.   On: 10/28/2023 21:05    Medications:   allopurinol   200 mg Oral Daily   calcitRIOL   0.5 mcg Oral Once per day on Monday Wednesday Friday   Chlorhexidine  Gluconate Cloth  6 each Topical Q0600   chlorpheniramine-HYDROcodone   5 mL Oral Q12H   doxycycline   100 mg Oral BID   guaiFENesin   1,200 mg Oral BID   heparin   5,000 Units Subcutaneous Q8H   insulin  aspart  0-5 Units Subcutaneous QHS   insulin  aspart  0-6 Units Subcutaneous TID WC   levothyroxine   112 mcg Oral Q0600   modafinil   200 mg Oral Daily   pantoprazole   40 mg Oral Daily   pregabalin   150 mg Oral TID   propranolol   40 mg Oral Daily   rosuvastatin   10 mg Oral Daily   sevelamer  carbonate  800 mg Oral TID with meals   sodium chloride  flush  3 mL Intravenous Q12H    Dialysis Orders: TTS [facility] 3h  B400   87.9kg  2K bath  AVF  Heparin  none Last OP HD 7/22, post wt 98kg (10kg over) Coming off 5-10 kg over after rx's for last 3 weeks  Home bp meds: Propranolol  40mg  every day Demadex  20mg  bid (not taking) Others: statin, renvela , PPI, ozempic , T4, rocaltrol  0.78mcg tts, zyloprim   Assessment/Plan: SOB/ PNA: started on IV abx in ED 7/22, per pmd, poss vol also Vol overload: has sig LE, hip and abd wall edema. May be part of #1, coming off 6-10kg over recently. S/p para today. Push UF with HD ESRD: on HD TTS. Last HD 7/22. Received HD yesterday and 3.5L removed. Next HD 7/25. LUE swelling: Per [facility], ok to use AVF.  Plan for outpatient F'gram HTN: bp's stable, on inderal  alone. Continue.  Anemia of esrd:  Dispo: May go home tomorrow after HD. Consider an extra HD on Saturday in outpatient given current volume overload.   Jon Piety, NP [facility] 10/30/2023,1:35 PM  LOS: 1 day

## 2023-10-30 NOTE — Evaluation (Signed)
 Physical Therapy Evaluation Patient Details Name: [name] MRN: [id number] DOB: [birth year] Today's Date: 10/30/2023  History of Present Illness  73 y.o. male adm 10/28/23 with SOB, CAP. PMHx: cervical myelopathy, CKD, CAD s/p CABG, DMII, GERD, HTN, prostate CA, neuropathy, HLD, ESRD on HD TTS, Bil BKA, Afib  Clinical Impression  Pt pleasant with bil prostheses donned but decreased securement in fit due to edema. Pt is normally very self-sufficient with use of rollator and able to walk community distances with help of wife at home. Pt with decreased endurance and transfers this date who will benefit from acute therapy to maximize mobility, safety and independence to decrease burden of care. OPPT continuation recommended.   HR 78 SPO2 100% on RA        If plan is discharge home, recommend the following: A little help with walking and/or transfers;Assistance with cooking/housework;Assist for transportation   Can travel by private vehicle        Equipment Recommendations None recommended by PT  Recommendations for Other Services       Functional Status Assessment Patient has had a recent decline in their functional status and/or demonstrates limited ability to make significant improvements in function in a reasonable and predictable amount of time     Precautions / Restrictions Precautions Precautions: Fall;Other (comment) Recall of Precautions/Restrictions: Intact Precaution/Restrictions Comments: bil BKA with prostheses      Mobility  Bed Mobility               General bed mobility comments: in chair on arrival and end of session    Transfers Overall transfer level: Needs assistance   Transfers: Sit to/from Stand Sit to Stand: Min assist, Contact guard assist           General transfer comment: min assist to rise from toilet with rail, CGA to rise from elevated bed    Ambulation/Gait Ambulation/Gait assistance: Contact guard assist Gait Distance (Feet):  120 Feet Assistive device: Rollator (4 wheels) Gait Pattern/deviations: Step-through pattern, Decreased stride length, Trunk flexed   Gait velocity interpretation: <1.8 ft/sec, indicate of risk for recurrent falls   General Gait Details: cues for posture and proximity to Rollator with pt able to self-regulate distance  Stairs            Wheelchair Mobility     Tilt Bed    Modified Rankin (Stroke Patients Only)       Balance Overall balance assessment: Needs assistance Sitting-balance support: No upper extremity supported, Feet supported Sitting balance-Leahy Scale: Fair     Standing balance support: Bilateral upper extremity supported, During functional activity, Reliant on assistive device for balance Standing balance-Leahy Scale: Poor Standing balance comment: rollator in standing, able to stand at sink propping on forearms                             Pertinent Vitals/Pain Pain Assessment Pain Assessment: No/denies pain    Home Living Family/patient expects to be discharged to:: Private residence Living Arrangements: Spouse/significant other Available Help at Discharge: Family;Available 24 hours/day Type of Home: House Home Access: Ramped entrance       Home Layout: One level Home Equipment: Agricultural consultant (2 wheels);Shower seat;Rollator (4 wheels);Wheelchair - manual;BSC/3in1;Grab bars - toilet;Grab bars - tub/shower Additional Comments: goes to OPPT 3x/wk    Prior Function Prior Level of Function : Independent/Modified Independent             Mobility Comments: walks with  rollator and prostheses, uses rails in bathroom as rollator won't fit ADLs Comments: ADLs independently, wife assists with IADLs, doesn't drive     Extremity/Trunk Assessment   Upper Extremity Assessment Upper Extremity Assessment: Overall WFL for tasks assessed;LUE deficits/detail LUE Deficits / Details: edema    Lower Extremity Assessment Lower Extremity  Assessment: Generalized weakness (edema)    Cervical / Trunk Assessment Cervical / Trunk Assessment: Kyphotic  Communication   Communication Communication: No apparent difficulties    Cognition Arousal: Alert Behavior During Therapy: WFL for tasks assessed/performed   PT - Cognitive impairments: No apparent impairments                         Following commands: Intact       Cueing Cueing Techniques: Verbal cues     General Comments      Exercises     Assessment/Plan    PT Assessment Patient needs continued PT services  PT Problem List Decreased activity tolerance;Decreased balance;Decreased mobility       PT Treatment Interventions Gait training;DME instruction;Functional mobility training;Patient/family education;Therapeutic activities;Therapeutic exercise    PT Goals (Current goals can be found in the Care Plan section)  Acute Rehab PT Goals Patient Stated Goal: return home and to the gym PT Goal Formulation: With patient Time For Goal Achievement: 11/13/23 Potential to Achieve Goals: Good    Frequency Min 2X/week     Co-evaluation               AM-PAC PT 6 Clicks Mobility  Outcome Measure Help needed turning from your back to your side while in a flat bed without using bedrails?: A Little Help needed moving from lying on your back to sitting on the side of a flat bed without using bedrails?: A Little Help needed moving to and from a bed to a chair (including a wheelchair)?: A Little Help needed standing up from a chair using your arms (e.g., wheelchair or bedside chair)?: A Little Help needed to walk in hospital room?: A Little Help needed climbing 3-5 steps with a railing? : A Lot 6 Click Score: 17    End of Session Equipment Utilized During Treatment: Gait belt Activity Tolerance: Patient tolerated treatment well Patient left: in chair;with call bell/phone within reach Nurse Communication: Mobility status PT Visit Diagnosis:  Other abnormalities of gait and mobility (R26.89);Difficulty in walking, not elsewhere classified (R26.2)    Time: 9164-9094 PT Time Calculation (min) (ACUTE ONLY): 30 min   Charges:   PT Evaluation $PT Eval Moderate Complexity: 1 Mod PT Treatments $Gait Training: 8-22 mins PT General Charges $$ ACUTE PT VISIT: 1 Visit         Lenoard SQUIBB, PT Acute Rehabilitation Services Office: [phone]   Lenoard NOVAK Kourtni Stineman 10/30/2023, 10:35 AM

## 2023-10-30 NOTE — Procedures (Signed)
 PROCEDURE SUMMARY:  Successful US  guided paracentesis from left lower quadrant.  Yielded 5.6 L of clear yellow fluid.  No immediate complications.  Pt tolerated well.   Specimen sent for labs.  EBL < 2 mL  Warren JONELLE Dais, NP 10/30/2023 12:52 PM  .

## 2023-10-30 NOTE — Progress Notes (Signed)
 Speech Language Pathology Treatment: Dysphagia  Patient Details Name: [name] MRN: [id number] DOB: 1950-12-28 Today's Date: 10/30/2023 Time: 8896-8892 SLP Time Calculation (min) (ACUTE ONLY): 4 min  Assessment / Plan / Recommendation Clinical Impression  Pt seen for ongoing dysphagia management.  Significant decrease in clinical s/s of PO intake today as compared to evaluation on 7/23.  There was a single delayed cough with initial swallow of thin liquid only.  There were no further clinical s/s of aspiration with any other straw sips of thin liquid or with solid textures.  Pt reports feeling of stasis with breakfast this morning which cleared with a liquid wash using coffee.  Pt denies reflux, but it is listed in his history.  Pt appears safe to continue current diet and has no further ST needs. SLP will sign off.   Recommend regular texture diet with thin liquid.     HPI HPI: [name] is a 73 year old man who presents emergency complaining of cough and shortness of breath. CXR 7/22: Hypoventilatory changes with patchy airspace opacities at the bases  either representing atelectasis or pneumonia.  Pt reports prior hx pna this year.  Pt with past medical history of ESRD on dialysis TTS schedule, hypothyroidism, chronic thrombocytopenia history of CAD status post CABG, DM type II, osteomyelitis s/p bilateral BKA, history of GI bleed and GERD      SLP Plan  All goals met          Recommendations  Diet recommendations: Regular;Thin liquid Liquids provided via: Cup;Straw Medication Administration: Whole meds with liquid Supervision: Patient able to self feed Compensations: Slow rate;Small sips/bites Postural Changes and/or Swallow Maneuvers: Seated upright 90 degrees                  Oral care BID     Dysphagia, unspecified (R13.10)     All goals met     Anette FORBES Grippe, MA, CCC-SLP Acute Rehabilitation Services Office: [phone] 10/30/2023, 11:24 AM

## 2023-10-31 ENCOUNTER — Other Ambulatory Visit (HOSPITAL_COMMUNITY): Payer: Self-pay

## 2023-10-31 ENCOUNTER — Inpatient Hospital Stay (HOSPITAL_COMMUNITY)

## 2023-10-31 DIAGNOSIS — R059 Cough, unspecified: Secondary | ICD-10-CM

## 2023-10-31 DIAGNOSIS — N186 End stage renal disease: Secondary | ICD-10-CM | POA: Diagnosis not present

## 2023-10-31 DIAGNOSIS — Z992 Dependence on renal dialysis: Secondary | ICD-10-CM

## 2023-10-31 DIAGNOSIS — E8770 Fluid overload, unspecified: Secondary | ICD-10-CM | POA: Diagnosis not present

## 2023-10-31 DIAGNOSIS — J189 Pneumonia, unspecified organism: Secondary | ICD-10-CM | POA: Diagnosis not present

## 2023-10-31 LAB — GLUCOSE, CAPILLARY
Glucose-Capillary: 136 mg/dL — ABNORMAL HIGH (ref 70–99)
Glucose-Capillary: 107 mg/dL — ABNORMAL HIGH (ref 70–99)
Glucose-Capillary: 142 mg/dL — ABNORMAL HIGH (ref 70–99)

## 2023-10-31 LAB — CBC WITH DIFFERENTIAL/PLATELET
Abs Immature Granulocytes: 0.05 K/uL (ref 0.00–0.07)
Basophils Absolute: 0 K/uL (ref 0.0–0.1)
Basophils Relative: 1 %
Eosinophils Absolute: 0.2 K/uL (ref 0.0–0.5)
Eosinophils Relative: 3 %
HCT: 33.2 % — ABNORMAL LOW (ref 39.0–52.0)
Hemoglobin: 10.4 g/dL — ABNORMAL LOW (ref 13.0–17.0)
Immature Granulocytes: 1 %
Lymphocytes Relative: 8 %
Lymphs Abs: 0.4 K/uL — ABNORMAL LOW (ref 0.7–4.0)
MCH: 26.3 pg (ref 26.0–34.0)
MCHC: 31.3 g/dL (ref 30.0–36.0)
MCV: 84.1 fL (ref 80.0–100.0)
Monocytes Absolute: 0.6 K/uL (ref 0.1–1.0)
Monocytes Relative: 13 %
Neutro Abs: 3.5 K/uL (ref 1.7–7.7)
Neutrophils Relative %: 74 %
Platelets: 91 K/uL — ABNORMAL LOW (ref 150–400)
RBC: 3.95 MIL/uL — ABNORMAL LOW (ref 4.22–5.81)
RDW: 18.6 % — ABNORMAL HIGH (ref 11.5–15.5)
WBC: 4.7 K/uL (ref 4.0–10.5)
nRBC: 0 % (ref 0.0–0.2)

## 2023-10-31 LAB — RENAL FUNCTION PANEL
Albumin: 1.9 g/dL — ABNORMAL LOW (ref 3.5–5.0)
Anion gap: 12 (ref 5–15)
BUN: 45 mg/dL — ABNORMAL HIGH (ref 8–23)
CO2: 26 mmol/L (ref 22–32)
Calcium: 8 mg/dL — ABNORMAL LOW (ref 8.9–10.3)
Chloride: 99 mmol/L (ref 98–111)
Creatinine, Ser: 6 mg/dL — ABNORMAL HIGH (ref 0.61–1.24)
GFR, Estimated: 9 mL/min — ABNORMAL LOW (ref >60)
Glucose, Bld: 155 mg/dL — ABNORMAL HIGH (ref 70–99)
Phosphorus: 4.4 mg/dL (ref 2.5–4.6)
Potassium: 4.7 mmol/L (ref 3.5–5.1)
Sodium: 137 mmol/L (ref 135–145)

## 2023-10-31 LAB — COMPREHENSIVE METABOLIC PANEL WITH GFR
ALT: 9 U/L (ref 0–44)
AST: 17 U/L (ref 15–41)
Albumin: 1.9 g/dL — ABNORMAL LOW (ref 3.5–5.0)
Alkaline Phosphatase: 152 U/L — ABNORMAL HIGH (ref 38–126)
Anion gap: 12 (ref 5–15)
BUN: 44 mg/dL — ABNORMAL HIGH (ref 8–23)
CO2: 26 mmol/L (ref 22–32)
Calcium: 7.9 mg/dL — ABNORMAL LOW (ref 8.9–10.3)
Chloride: 101 mmol/L (ref 98–111)
Creatinine, Ser: 5.75 mg/dL — ABNORMAL HIGH (ref 0.61–1.24)
GFR, Estimated: 10 mL/min — ABNORMAL LOW (ref >60)
Glucose, Bld: 155 mg/dL — ABNORMAL HIGH (ref 70–99)
Potassium: 4.6 mmol/L (ref 3.5–5.1)
Sodium: 139 mmol/L (ref 135–145)
Total Bilirubin: 0.9 mg/dL (ref 0.0–1.2)
Total Protein: 4.4 g/dL — ABNORMAL LOW (ref 6.5–8.1)

## 2023-10-31 LAB — MAGNESIUM: Magnesium: 1.8 mg/dL (ref 1.7–2.4)

## 2023-10-31 LAB — PHOSPHORUS: Phosphorus: 4.3 mg/dL (ref 2.5–4.6)

## 2023-10-31 LAB — CYTOLOGY - NON PAP

## 2023-10-31 MED ORDER — BENZONATATE 100 MG PO CAPS
100.0000 mg | ORAL_CAPSULE | Freq: Three times a day (TID) | ORAL | 0 refills | Status: DC | PRN
  Filled 2023-10-31: qty 20, 7d supply, fill #0

## 2023-10-31 MED ORDER — ALTEPLASE 2 MG IJ SOLR: 2.0000 mg | Freq: Once | INTRAMUSCULAR | Status: DC | PRN

## 2023-10-31 MED ORDER — LIDOCAINE HCL (PF) 1 % IJ SOLN: 5.0000 mL | INTRAMUSCULAR | Status: DC | PRN

## 2023-10-31 MED ORDER — LIDOCAINE-PRILOCAINE 2.5-2.5 % EX CREA
1.0000 | TOPICAL_CREAM | CUTANEOUS | Status: DC | PRN
Start: 2023-10-31 — End: 2023-10-31

## 2023-10-31 MED ORDER — DOXYCYCLINE HYCLATE 100 MG PO TABS
100.0000 mg | ORAL_TABLET | Freq: Two times a day (BID) | ORAL | 0 refills | Status: AC
  Filled 2023-10-31: qty 10, 5d supply, fill #0

## 2023-10-31 MED ORDER — HEPARIN SODIUM (PORCINE) 1000 UNIT/ML DIALYSIS
1000.0000 [IU] | INTRAMUSCULAR | Status: DC | PRN
Start: 2023-10-31 — End: 2023-10-31

## 2023-10-31 MED ORDER — HYDROCOD POLI-CHLORPHE POLI ER 10-8 MG/5ML PO SUER
5.0000 mL | Freq: Two times a day (BID) | ORAL | 0 refills | Status: DC | PRN
  Filled 2023-10-31: qty 70, 7d supply, fill #0

## 2023-10-31 MED ORDER — GUAIFENESIN ER 600 MG PO TB12
600.0000 mg | ORAL_TABLET | Freq: Two times a day (BID) | ORAL | 0 refills | Status: AC
  Filled 2023-10-31: qty 10, 5d supply, fill #0

## 2023-10-31 MED ORDER — PENTAFLUOROPROP-TETRAFLUOROETH EX AERO: 1.0000 | INHALATION_SPRAY | CUTANEOUS | Status: DC | PRN

## 2023-10-31 MED ORDER — CALCITRIOL 0.5 MCG PO CAPS: 0.5000 ug | ORAL_CAPSULE | ORAL | Status: DC

## 2023-10-31 NOTE — Discharge Summary (Signed)
 Physician Discharge Summary   Patient: [name] MRN: [id number] DOB: 1950/06/15  Admit date:     10/28/2023  Discharge date: 10/31/23  Discharge Physician: Alejandro Marker, DO   PCP: [name], NP   Recommendations at discharge:   Follow-up with PCP within 1 to 2 weeks repeat CBC, CMP, mag, Phos within 1 week Follow-up with nephrology in outpatient setting continue maintenance hemodialysis. Follow-up with vascular surgery in outpatient setting for fistulogram  Discharge Diagnoses: Principal Problem:   CAP (community acquired pneumonia)  Resolved Problems:   * No resolved hospital problems. Doctors Hospital Course: The patient is a 73 year old Caucasian male with a past medical history significant for but not limited to permanent atrial fibrillation not on anticoagulation due to GI bleed, history of chronic anemia, prior CABG, thrombocytopenia, bilateral BKA's, hypertension, hypothyroidism, diabetes mellitus type 2, ESRD on dialysis Tuesday Thursday Saturday presented to the ED because of shortness of breath. He states that he had a cough that began about 24 hours prior to admission and admitted that he was having some edema in his thighs. He states this has been going on for weeks and he went to his dialysis treatment yesterday. Feels okay but continues to complain of worsening cough. Currently being admitted for acute hypoxic respiratory failure with bibasilar pneumonia and volume overload. Nephrology has been consulted. Given that he had recurrent outflow vein stenosis of his AV fistula vascular was also consulted.   **Interim History Patient was taken for dialysis and continues to be somewhat volume overloaded.  Given his volume overload we will pursue a abdominal ultrasound and paracentesis.  He gets paracentesis quite frequently and his last one was 2 weeks ago.  He underwent successful paracentesis today and removed 5.6 L. Continues to have a cough.  Plan is for dialysis again  tomorrow.  Respiratory status is slowly improving.  Assessment and Plan:  Acute respiratory failure with hypoxia in the setting of suspected pneumonia and volume overload from CHF.  Does not have a fever but does have a cough and this is more likely with volume overload however will rule out infection and continue doxycycline  100 mg p.o. twice daily.  Given volume overload nephrology been consulted for maintenance of hemodialysis and volume maintenance. Chest x-ray this AM showed Bandlike densities compatible with scarring or atelectasis in the right mid to lower lung.Prior CABG. Degenerative glenohumeral arthropathy, left greater than right. Spurring of the right AC joint. Will check Ambulatory Home O2 Screen Continue monitor respiratory status carefully and will give him a pulmonary toilet and flutter valve, incentive spirometer, guaifenesin  and place him on DuoNebs. SpO2: 96 % O2 Flow Rate (L/min): 3 L/min -CTM Respiratory Status Carefully and Wean O2. Will need an Ambulatory Home O2 screen prior to D/C - Needs to have a cough so we will provide him with antitussives including Tussionex and benzonatate    ESRD on hemodialysis Tuesday Thursday Saturday: Nephrology consulted and plans for dialysis later this evening given his edema hypoxemia.-BUN/Cr Trend: Recent Labs  Lab 10/28/23 2109 10/30/23 0229  BUN 31* 26*  CREATININE 4.56* 4.14*  -C/w Sevelamer  Carbonate 800 mg po TIDwm and Calcitriol  0.5 mcg po 3x weekly  -Avoid Nephrotoxic Medications, Contrast Dyes, Hypotension and Dehydration to Ensure Adequate Renal Perfusion and will need to Renally Adjust Meds -Continue to Monitor and Trend Renal Function carefully and repeat CMP in the AM    Permanent atrial fibrillation: Continue to monitor telemetry and continue Inderal  but not on anticoagulation due to prior  GU bleeding  Anasarca: Given his Volume Overload Paracentesis ordered and removed 5.6 Liters.  Urinalysis showed hazy appearance but  did not really appear to be infected as his total nucleated cell count was under 55.  With Gram stain showing no WBCs and culture pending  Diabetes mellitus 2: Previously has tried Ozempic . Follow CBGs and provide SSI.  Hemoglobin A1c was 4.9.  Continue monitor CBGs per protocol:  Recent Labs  Lab 10/29/23 0646 10/29/23 1114 10/29/23 2159 10/30/23 0626 10/30/23 1308 10/30/23 1544  GLUCAP 82 75 147* 97 107* 97   Chronic thrombocytopenia: Platelets stable and near baseline noting current platelets are 122,000 and in January they were 82,000. Now Plt Count Trend:  Recent Labs  Lab 10/28/23 2109 10/30/23 0229  PLT 122* 120*  -CTM for S/Sx of Bleeding; No overt bleeding noted. Repeat CBC in the Am  Normocytic Anemia/Anemia of Chronic Kidney Disease: Hgb/Hct went from 11.9/39.5 -> 11.1/36.1 w/ an MCV of 86.2. Check Anemia Panel in the AM. CTM for S/Sx of Bleeding; No overt bleeding noted. Repeat CBC in the AM  History of CABG/peripheral vascular disease status post bilateral BKA: Continue beta-blocker with propranolol  40 mg p.o. daily and rosuvastatin  10 mg p.o. daily; Currently asymptomatic   Hypothyroidism: Continue levothyroxine  112 mcg p.o. 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  GERD/GI Prophylaxis: C/w PPI with Pantoprazole  40 mg po Daily   Class I Obesity: Complicates overall prognosis and care. Estimated body mass index is 30.26 kg/m as calculated from the following:   Height as of this encounter: 5' 11 (1.803 m).   Weight as of this encounter: 98.4 kg. Weight Loss and Dietary  Counseling given   Consultants: Nephrology Procedures performed: As delineated as above Disposition: Home Diet recommendation:  Discharge Diet Orders (From admission, onward)     Start     Ordered   10/31/23 0000  Diet - low sodium heart healthy       Comments: 2 Gram Sodium Diet   10/31/23 1431   10/31/23 0000  Diet clear liquid        10/31/23 1431           Renal diet DISCHARGE MEDICATION: Allergies as of 10/31/2023       Reactions   Morphine  Other (See Comments)   Other reaction(s): Delusions (intolerance)   Mushroom Extract Complex (obsolete) Nausea Only        Medication List     STOP taking these medications    doxycycline  100 MG capsule Commonly known as: VIBRAMYCIN  Replaced by: doxycycline  100 MG tablet       TAKE these medications    acetaminophen  325 MG tablet Commonly known as: TYLENOL  Take 2 tablets (650 mg total) by mouth every 4 (four) hours as needed for fever or mild pain.   albuterol  108 (90 Base) MCG/ACT inhaler Commonly known as: VENTOLIN  HFA Inhale 2 puffs into the lungs every 6 (six) hours as needed for shortness of breath or wheezing.   allopurinol  100 MG tablet Commonly known as: ZYLOPRIM  Take 2 tablets (200 mg total) by mouth daily.  benzonatate  100 MG capsule Commonly known as: TESSALON  Take 1 capsule (100 mg total) by mouth 3 (three) times daily as needed for cough.   calcitRIOL  0.5 MCG capsule Commonly known as: ROCALTROL  Take 1 capsule (0.5 mcg total) by mouth 3 (three) times a week.   chlorpheniramine-HYDROcodone  10-8 MG/5ML Commonly known as: TUSSIONEX Take 5 mLs by mouth every 12 (twelve) hours as needed for cough.   doxycycline  100 MG tablet Commonly known as: VIBRA -TABS Take 1 tablet (100 mg total) by mouth 2 (two) times daily for 5 days. Replaces: doxycycline  100 MG capsule   guaiFENesin  600 MG 12 hr tablet Commonly known as: MUCINEX  Take 1 tablet (600 mg total) by mouth 2 (two) times daily for 5 days.    levothyroxine  112 MCG tablet Commonly known as: SYNTHROID  Take 1 tablet (112 mcg total) by mouth daily before breakfast.   MIRCERA IJ Inject into the skin.   modafinil  200 MG tablet Commonly known as: PROVIGIL  Take 1 tablet by mouth once daily What changed: Another medication with the same name was removed. Continue taking this medication, and follow the directions you see here.   Ozempic  (0.25 or 0.5 MG/DOSE) 2 MG/3ML Sopn Generic drug: Semaglutide (0.25 or 0.5MG /DOS) Inject 0.25 mg into the skin once a week.   pantoprazole  40 MG tablet Commonly known as: PROTONIX  Take 1 tablet (40 mg total) by mouth daily.   pregabalin  150 MG capsule Commonly known as: LYRICA  Take 1 capsule (150 mg total) by mouth 3 (three) times daily.   propranolol  40 MG tablet Commonly known as: INDERAL  Take 40 mg by mouth daily.   rosuvastatin  10 MG tablet Commonly known as: CRESTOR  Take 1 tablet (10 mg total) by mouth daily.   sevelamer  carbonate 800 MG tablet Commonly known as: RENVELA  Take 800 mg by mouth 3 (three) times daily.   torsemide  20 MG tablet Commonly known as: DEMADEX  Take 1 tablet (20 mg total) by mouth 2 (two) times daily.        Follow-up Information     [name], NP Follow up.   Specialty: Family Medicine Contact information: [address] [place] [address] [phone]                Discharge Exam: Filed Weights   10/31/23 0405 10/31/23 0755 10/31/23 1221  Weight: 94.5 kg 94.9 kg 91.1 kg   Vitals:   10/31/23 1210 10/31/23 1215  BP: 95/61 (!) 111/46  Pulse: 72 65  Resp: 14 18  Temp: 98.2 F (36.8 C)   SpO2: 98% 97%   Examination: Physical Exam:  Constitutional: WN/WD overweight Caucasian male in no acute distress Respiratory: Diminished to auscultation bilaterally, no wheezing, rales, rhonchi or crackles. Normal respiratory effort and patient is not tachypenic. No accessory muscle use.  Unlabored breathing does have a  slight cough. Cardiovascular: RRR, no murmurs / rubs / gallops. S1 and S2 auscultated.  Abdomen: Soft, non-tender, distended secondary to body habitus.  Bowel sounds positive.  GU: Deferred. Musculoskeletal: Bilateral BKA's Skin: No rashes, lesions, ulcers on limited skin evaluation. No induration; Warm and dry.  Neurologic: CN 2-12 grossly intact with no focal deficits. Romberg sign and cerebellar reflexes not assessed.  Psychiatric: Normal judgment and insight. Alert and oriented x 3. Normal mood and appropriate affect.   Condition at discharge: stable  The results of significant diagnostics from this hospitalization (including imaging, microbiology, ancillary and laboratory) are listed below for reference.   Imaging Studies: DG CHEST PORT 1 VIEW Result Date:  10/31/2023 CLINICAL DATA:  Shortness of breath EXAM: PORTABLE CHEST 1 VIEW COMPARISON:  Chest radiograph dated 10/30/2023 FINDINGS: Normal lung volumes. No focal consolidations. No pleural effusion or pneumothorax. Similar mildly enlarged postsurgical cardiomediastinal silhouette. Cervical spinal fixation hardware appears intact. Median sternotomy wires are nondisplaced. Surgical clips project over the right upper quadrant. IMPRESSION: No acute disease. Electronically Signed   By: Limin  Xu M.D.   On: 10/31/2023 07:19   US  ASCITES (ABDOMEN LIMITED) Result Date: 10/30/2023 CLINICAL DATA:  Ascites. EXAM: LIMITED ABDOMEN ULTRASOUND FOR ASCITES TECHNIQUE: Limited ultrasound survey for ascites was performed in all four abdominal quadrants. COMPARISON:  September 08, 2023. FINDINGS: Mild ascites is noted in right upper quadrant and left lower quadrant. Moderate ascites is noted in left upper and right lower quadrants. IMPRESSION: Mild to moderate ascites is noted. Electronically Signed   By: Lynwood Landy Raddle M.D.   On: 10/30/2023 14:47   IR Paracentesis Result Date: 10/30/2023 INDICATION: Patient with a history of end-stage renal disease with  recurrent ascites. Interventional Radiology asked to perform a diagnostic and therapeutic paracentesis. EXAM: ULTRASOUND GUIDED PARACENTESIS MEDICATIONS: 1% lidocaine  10 mL COMPLICATIONS: None immediate. PROCEDURE: Informed written consent was obtained from the patient after a discussion of the risks, benefits and alternatives to treatment. A timeout was performed prior to the initiation of the procedure. Initial ultrasound scanning demonstrates a large amount of ascites within the left lower abdominal quadrant. The left lower abdomen was prepped and draped in the usual sterile fashion. 1% lidocaine  was used for local anesthesia. Following this, a 19 gauge, 7-cm, Yueh catheter was introduced. An ultrasound image was saved for documentation purposes. The paracentesis was performed. The catheter was removed and a dressing was applied. The patient tolerated the procedure well without immediate post procedural complication. FINDINGS: A total of approximately 5.6 L of clear yellow fluid was removed. Samples were sent to the laboratory as requested by the clinical team. IMPRESSION: Successful ultrasound-guided paracentesis yielding 5.6 liters of peritoneal fluid. Procedure performed by Warren Dais, NP Electronically Signed   By: Juliene Balder M.D.   On: 10/30/2023 14:46   DG CHEST PORT 1 VIEW Result Date: 10/30/2023 CLINICAL DATA:  Shortness of breath.  Cough and congestion. EXAM: PORTABLE CHEST 1 VIEW COMPARISON:  10/28/2023 FINDINGS: Bandlike densities compatible with scarring or atelectasis in the right mid to lower lung. Prior CABG. Lower cervical plate and screw fixator. Heart size within normal limits for technique. Mild lower thoracic spondylosis. Degenerative glenohumeral arthropathy, left greater than right. Spurring of the right AC joint. IMPRESSION: 1. Bandlike densities compatible with scarring or atelectasis in the right mid to lower lung. 2. Prior CABG. 3. Degenerative glenohumeral arthropathy, left  greater than right. Spurring of the right AC joint. Electronically Signed   By: Ryan Salvage M.D.   On: 10/30/2023 09:52   DG Chest 2 View Result Date: 10/28/2023 CLINICAL DATA:  Shortness of breath and cough EXAM: CHEST - 2 VIEW COMPARISON:  10/28/2023 FINDINGS: Post sternotomy changes. Patchy airspace disease at the bases. Normal cardiac size. No pneumothorax. Hardware in the cervicothoracic spine IMPRESSION: Hypoventilatory changes with patchy airspace opacities at the bases either representing atelectasis or pneumonia. Electronically Signed   By: Luke Bun M.D.   On: 10/28/2023 22:56   DG Chest Port 1 View Result Date: 10/28/2023 CLINICAL DATA:  sob EXAM: PORTABLE CHEST 1 VIEW COMPARISON:  CT chest 05/04/2023 FINDINGS: The heart and mediastinal contours are within normal limits. Low lung volumes with left lower mid lung  zone airspace opacities. No pulmonary edema. No pleural effusion. No pneumothorax. No acute osseous abnormality.  Sternotomy wires are intact. IMPRESSION: Low lung volumes with left lower mid lung zone airspace opacities. Recommend repeat chest x-ray PA and lateral view with improved inspiratory effort for further evaluation Electronically Signed   By: Morgane  Naveau M.D.   On: 10/28/2023 21:05   IR Paracentesis Result Date: 10/17/2023 INDICATION: 73 year old male with end-stage renal disease and recurrent ascites for therapeutic paracentesis. EXAM: ULTRASOUND GUIDED LEFT PARACENTESIS MEDICATIONS: 1% lidocaine  10 mL . COMPLICATIONS: None immediate. PROCEDURE: Informed written consent was obtained from the patient after a discussion of the risks, benefits and alternatives to treatment. A timeout was performed prior to the initiation of the procedure. Initial ultrasound scanning demonstrates a large amount of ascites within the right lower abdominal quadrant. The right lower abdomen was prepped and draped in the usual sterile fashion. 1% lidocaine  was used for local anesthesia.  Following this, a 19 gauge, 7-cm, Yueh catheter was introduced. An ultrasound image was saved for documentation purposes. The paracentesis was performed. The catheter was removed and a dressing was applied. The patient tolerated the procedure well without immediate post procedural complication. FINDINGS: A total of approximately 6.5 L of clear, yellow fluid was removed. IMPRESSION: Successful ultrasound-guided paracentesis yielding 6.5 liters of peritoneal fluid. PLAN: Performed By Lavanda Jurist, PA-C Electronically Signed   By: Cordella Banner   On: 10/17/2023 15:17   IR Paracentesis Result Date: 10/03/2023 INDICATION: 73 year old male with end-stage renal disease and recurrent ascites for therapeutic paracentesis. EXAM: ULTRASOUND GUIDED LEFT PARACENTESIS MEDICATIONS: 1% lidocaine  10 mL COMPLICATIONS: None immediate. PROCEDURE: Informed written consent was obtained from the patient after a discussion of the risks, benefits and alternatives to treatment. A timeout was performed prior to the initiation of the procedure. Initial ultrasound scanning demonstrates a large amount of ascites within the right lower abdominal quadrant. The right lower abdomen was prepped and draped in the usual sterile fashion. 1% lidocaine  was used for local anesthesia. Following this, a 19 gauge, 7-cm, Yueh catheter was introduced. An ultrasound image was saved for documentation purposes. The paracentesis was performed. The catheter was removed and a dressing was applied. The patient tolerated the procedure well without immediate post procedural complication. Patient received post-procedure intravenous albumin ; see nursing notes for details. FINDINGS: A total of approximately 7.5 L of clear, yellow fluid was removed. IMPRESSION: Successful ultrasound-guided paracentesis yielding 7.5 liters of peritoneal fluid. PLAN: Performed By Lavanda Jurist, PA-C Electronically Signed   By: Cordella Banner   On: 10/03/2023 17:07     Microbiology: Results for orders placed or performed during the hospital encounter of 10/28/23  Blood culture (routine x 2)     Status: None (Preliminary result)   Collection Time: 10/28/23  8:36 PM   Specimen: BLOOD RIGHT FOREARM  Result Value Ref Range Status   Specimen Description   Final    BLOOD RIGHT FOREARM Performed at [facility], [address], [place], [place] [address]    Special Requests   Final    BOTTLES DRAWN AEROBIC AND ANAEROBIC Blood Culture results may not be optimal due to an inadequate volume of blood received in culture bottles Performed at [facility], [address], [place], [place] [address]    Culture   Final    NO GROWTH 2 DAYS Performed at [facility], [address], [place], [place] [address]    Report Status PENDING  Incomplete  Resp panel by  RT-PCR (RSV, Flu A&B, Covid) Anterior Nasal Swab     Status: None   Collection Time: 10/28/23  9:09 PM   Specimen: Anterior Nasal Swab  Result Value Ref Range Status   SARS Coronavirus 2 by RT PCR NEGATIVE NEGATIVE Final    Comment: (NOTE) SARS-CoV-2 target nucleic acids are NOT DETECTED.  The SARS-CoV-2 RNA is generally detectable in upper respiratory specimens during the acute phase of infection. The lowest concentration of SARS-CoV-2 viral copies this assay can detect is 138 copies/mL. A negative result does not preclude SARS-Cov-2 infection and should not be used as the sole basis for treatment or other patient management decisions. A negative result may occur with  improper specimen collection/handling, submission of specimen other than nasopharyngeal swab, presence of viral mutation(s) within the areas targeted by this assay, and inadequate number of viral copies(<138 copies/mL). A negative result must be combined with clinical observations, patient history, and epidemiological information. The expected result is Negative.  Fact Sheet for  Patients:  BloggerCourse.com  Fact Sheet for Healthcare Providers:  SeriousBroker.it  This test is no t yet approved or cleared by the [place]  FDA and  has been authorized for detection and/or diagnosis of SARS-CoV-2 by FDA under an Emergency Use Authorization (EUA). This EUA will remain  in effect (meaning this test can be used) for the duration of the COVID-19 declaration under Section 564(b)(1) of the Act, 21 U.S.C.section 360bbb-3(b)(1), unless the authorization is terminated  or revoked sooner.       Influenza A by PCR NEGATIVE NEGATIVE Final   Influenza B by PCR NEGATIVE NEGATIVE Final    Comment: (NOTE) The Xpert Xpress SARS-CoV-2/FLU/RSV plus assay is intended as an aid in the diagnosis of influenza from Nasopharyngeal swab specimens and should not be used as a sole basis for treatment. Nasal washings and aspirates are unacceptable for Xpert Xpress SARS-CoV-2/FLU/RSV testing.  Fact Sheet for Patients: BloggerCourse.com  Fact Sheet for Healthcare Providers: SeriousBroker.it  This test is not yet approved or cleared by the [place]  FDA and has been authorized for detection and/or diagnosis of SARS-CoV-2 by FDA under an Emergency Use Authorization (EUA). This EUA will remain in effect (meaning this test can be used) for the duration of the COVID-19 declaration under Section 564(b)(1) of the Act, 21 U.S.C. section 360bbb-3(b)(1), unless the authorization is terminated or revoked.     Resp Syncytial Virus by PCR NEGATIVE NEGATIVE Final    Comment: (NOTE) Fact Sheet for Patients: BloggerCourse.com  Fact Sheet for Healthcare Providers: SeriousBroker.it  This test is not yet approved or cleared by the [place]  FDA and has been authorized for detection and/or diagnosis of SARS-CoV-2 by FDA under an Emergency  Use Authorization (EUA). This EUA will remain in effect (meaning this test can be used) for the duration of the COVID-19 declaration under Section 564(b)(1) of the Act, 21 U.S.C. section 360bbb-3(b)(1), unless the authorization is terminated or revoked.  Performed at [facility], [address], [place], [place] [address]   Blood culture (routine x 2)     Status: None (Preliminary result)   Collection Time: 10/29/23  7:40 AM   Specimen: BLOOD RIGHT HAND  Result Value Ref Range Status   Specimen Description BLOOD RIGHT HAND  Final   Special Requests   Final    BOTTLES DRAWN AEROBIC ONLY Blood Culture results may not be optimal due to an inadequate volume of blood received in culture bottles   Culture   Final  NO GROWTH 2 DAYS Performed at [facility], [address], [place], [place] [address]    Report Status PENDING  Incomplete  Anaerobic culture w Gram Stain     Status: None (Preliminary result)   Collection Time: 10/30/23 11:47 AM   Specimen: Abdomen; Peritoneal Fluid  Result Value Ref Range Status   Specimen Description PERITONEAL  Final   Special Requests ABDOMEN  Final   Gram Stain   Final    NO WBC SEEN NO ORGANISMS SEEN Performed at [facility], [address], [place], [place] [address]    Culture PENDING  Incomplete   Report Status PENDING  Incomplete  Body fluid culture w Gram Stain     Status: None (Preliminary result)   Collection Time: 10/30/23 11:47 AM   Specimen: Abdomen; Peritoneal Fluid  Result Value Ref Range Status   Specimen Description PERITONEAL  Final   Special Requests ABDOMEN  Final   Gram Stain NO WBC SEEN NO ORGANISMS SEEN   Final   Culture   Final    NO GROWTH < 24 HOURS Performed at [facility], [address], [place], [place] [address]    Report Status PENDING  Incomplete   *Note: Due to a large number of results and/or encounters for the requested time period, some results have not been  displayed. A complete set of results can be found in Results Review.   Labs: CBC: Recent Labs  Lab 10/28/23 2109 10/30/23 0229 10/31/23 0317  WBC 8.7 4.8 4.7  NEUTROABS  --  3.8 3.5  HGB 11.9* 11.1* 10.4*  HCT 39.5 36.1* 33.2*  MCV 86.4 86.2 84.1  PLT 122* 120* 91*   Basic Metabolic Panel: Recent Labs  Lab 10/28/23 2109 10/30/23 0229 10/31/23 0317  NA 139 138 139  137  K 5.1 4.2 4.6  4.7  CL 98 97* 101  99  CO2 24 26 26  26   GLUCOSE 97 132* 155*  155*  BUN 31* 26* 44*  45*  CREATININE 4.56* 4.14* 5.75*  6.00*  CALCIUM  8.8* 8.4* 7.9*  8.0*  MG  --  1.6* 1.8  PHOS  --  3.6 4.3  4.4   Liver Function Tests: Recent Labs  Lab 10/30/23 0229 10/31/23 0317  AST 19 17  ALT 10 9  ALKPHOS 161* 152*  BILITOT 1.1 0.9  PROT 5.3* 4.4*  ALBUMIN  2.4* 1.9*  1.9*   CBG: Recent Labs  Lab 10/30/23 1544 10/30/23 2114 10/31/23 0616 10/31/23 0735 10/31/23 1251  GLUCAP 97 110* 136* 107* 142*   Discharge time spent: greater than 30 minutes.  Signed: Alejandro Marker, DO [facility] Hospitalists 10/31/2023

## 2023-10-31 NOTE — Discharge Planning (Signed)
 [facility] Patient Discharge Orders - [facility] CLINIC: [facility] TTS  Patient's name: [name] Admit/DC Dates: 10/28/2023 - 10/31/2023  DISCHARGE DIAGNOSES: CAP: Presented to ED with shortness of breath. Had cough 24-hours prior to admission. Was dc'd on PO doxy 100  mg BID for 5 days. Has been afebrile during admission Volume overload/ARF/CHF: Had HD in hospital and underwent a paracentesis which yielded 5.7 L of fluid 3.   ESRD on HD  HD ORDER CHANGES: Heparin  change: no heparin  EDW Change: yes New EDW: 91.1 but reassess at clinic [place] Change: no change  ANEMIA MANAGEMENT: Aranesp : Given: no ESA given  Amount/Date of last dose: 100 mcg given on 5/29 ESA dose for discharge: mircera per protocol IV Iron dose at discharge: no Transfusion: Given: no  BONE/MINERAL MEDICATIONS: Hectorol/Calcitriol  change: no change Sensipar/Parsabiv change: no change  ACCESS INTERVENTION/CHANGE: no change - L AVF Details:   RECENT LABS: Recent Labs  Lab 10/31/23 0317  HGB 10.4*  NA 139  137  K 4.6  4.7  CALCIUM  7.9*  8.0*  PHOS 4.3  4.4  ALBUMIN  1.9*  1.9*    IV ANTIBIOTICS: No but taking Doxy 100  mg BID for 5-days Details:  OTHER ANTICOAGULATION: On Coumadin?: no Last INR: Managed By:  OTHER/APPTS/LAB ORDERS:   D/C Meds to be reconciled by nurse after every discharge.  Completed By: Belvie Och, NP   Reviewed by: MD:______ RN_______

## 2023-10-31 NOTE — Progress Notes (Signed)
 D/C order noted. Contacted [facility] on [address] to be advised of pt's d/c today and that pt should resume care tomorrow.   Randine Mungo Dialysis Navigator [phone]

## 2023-10-31 NOTE — Progress Notes (Signed)
 [facility] Progress Note   Subjective:    Seen and examined patient at HD. Plan for 4L today. Tolerated yesterday's HD with net UF 3.5L. He remains overloaded. S/p paracentesis today and 5.6L was removed. Next HD 7/25 1st shift in preparation for him to go home afterwards.   Objective Vitals:   10/31/23 1000 10/31/23 1030 10/31/23 1054 10/31/23 1105  BP: (!) 98/42 (!) 94/52 (!) 87/33 (!) 109/48  Pulse: 63 76 66 (!) 59  Resp: 17 18 (!) 22 14  Temp:      TempSrc:      SpO2: 93% 93% 97% 99%  Weight:      Height:       Physical Exam General: Awake, alert, NAD Heart: S1 and S2; No murmurs, gallops, or rubs Lungs: (+) fine rales L mid lobe, no wheezing Abdomen: Large, distended Extremities: LUE 2+ > RUE; 1+ edema BL thighs Dialysis Access: L AVF   Filed Weights   10/29/23 1945 10/31/23 0405 10/31/23 0755  Weight: 98.4 kg 94.5 kg 94.9 kg    Intake/Output Summary (Last 24 hours) at 10/31/2023 1119 Last data filed at 10/30/2023 1817 Gross per 24 hour  Intake --  Output 50 ml  Net -50 ml    Additional Objective Labs: Basic Metabolic Panel: Recent Labs  Lab 10/28/23 2109 10/30/23 0229 10/31/23 0317  NA 139 138 139  137  K 5.1 4.2 4.6  4.7  CL 98 97* 101  99  CO2 24 26 26  26   GLUCOSE 97 132* 155*  155*  BUN 31* 26* 44*  45*  CREATININE 4.56* 4.14* 5.75*  6.00*  CALCIUM  8.8* 8.4* 7.9*  8.0*  PHOS  --  3.6 4.3  4.4   Liver Function Tests: Recent Labs  Lab 10/30/23 0229 10/31/23 0317  AST 19 17  ALT 10 9  ALKPHOS 161* 152*  BILITOT 1.1 0.9  PROT 5.3* 4.4*  ALBUMIN  2.4* 1.9*  1.9*   No results for input(s): LIPASE, AMYLASE in the last 168 hours. CBC: Recent Labs  Lab 10/28/23 2109 10/30/23 0229 10/31/23 0317  WBC 8.7 4.8 4.7  NEUTROABS  --  3.8 3.5  HGB 11.9* 11.1* 10.4*  HCT 39.5 36.1* 33.2*  MCV 86.4 86.2 84.1  PLT 122* 120* 91*   Blood Culture    Component Value Date/Time   SDES PERITONEAL 10/30/2023 1147   SDES  PERITONEAL 10/30/2023 1147   SPECREQUEST ABDOMEN 10/30/2023 1147   SPECREQUEST ABDOMEN 10/30/2023 1147   CULT PENDING 10/30/2023 1147   CULT  10/30/2023 1147    NO GROWTH < 24 HOURS Performed at [facility], [address] [place], [place] [address]    REPTSTATUS PENDING 10/30/2023 1147   REPTSTATUS PENDING 10/30/2023 1147    Cardiac Enzymes: No results for input(s): CKTOTAL, CKMB, CKMBINDEX, TROPONINI in the last 168 hours. CBG: Recent Labs  Lab 10/30/23 1308 10/30/23 1544 10/30/23 2114 10/31/23 0616 10/31/23 0735  GLUCAP 107* 97 110* 136* 107*   Iron Studies: No results for input(s): IRON, TIBC, TRANSFERRIN, FERRITIN in the last 72 hours. Lab Results  Component Value Date   INR 1.4 (H) 05/04/2023   INR 1.5 (H) 10/11/2022   INR 1.6 (H) 09/09/2022   Studies/Results: DG CHEST PORT 1 VIEW Result Date: 10/31/2023 CLINICAL DATA:  Shortness of breath EXAM: PORTABLE CHEST 1 VIEW COMPARISON:  Chest radiograph dated 10/30/2023 FINDINGS: Normal lung volumes. No focal consolidations. No pleural effusion or pneumothorax. Similar mildly enlarged postsurgical cardiomediastinal silhouette.  Cervical spinal fixation hardware appears intact. Median sternotomy wires are nondisplaced. Surgical clips project over the right upper quadrant. IMPRESSION: No acute disease. Electronically Signed   By: Limin  Xu M.D.   On: 10/31/2023 07:19   US  ASCITES (ABDOMEN LIMITED) Result Date: 10/30/2023 CLINICAL DATA:  Ascites. EXAM: LIMITED ABDOMEN ULTRASOUND FOR ASCITES TECHNIQUE: Limited ultrasound survey for ascites was performed in all four abdominal quadrants. COMPARISON:  September 08, 2023. FINDINGS: Mild ascites is noted in right upper quadrant and left lower quadrant. Moderate ascites is noted in left upper and right lower quadrants. IMPRESSION: Mild to moderate ascites is noted. Electronically Signed   By: Lynwood Landy Raddle M.D.   On: 10/30/2023 14:47   IR Paracentesis Result Date:  10/30/2023 INDICATION: Patient with a history of end-stage renal disease with recurrent ascites. Interventional Radiology asked to perform a diagnostic and therapeutic paracentesis. EXAM: ULTRASOUND GUIDED PARACENTESIS MEDICATIONS: 1% lidocaine  10 mL COMPLICATIONS: None immediate. PROCEDURE: Informed written consent was obtained from the patient after a discussion of the risks, benefits and alternatives to treatment. A timeout was performed prior to the initiation of the procedure. Initial ultrasound scanning demonstrates a large amount of ascites within the left lower abdominal quadrant. The left lower abdomen was prepped and draped in the usual sterile fashion. 1% lidocaine  was used for local anesthesia. Following this, a 19 gauge, 7-cm, Yueh catheter was introduced. An ultrasound image was saved for documentation purposes. The paracentesis was performed. The catheter was removed and a dressing was applied. The patient tolerated the procedure well without immediate post procedural complication. FINDINGS: A total of approximately 5.6 L of clear yellow fluid was removed. Samples were sent to the laboratory as requested by the clinical team. IMPRESSION: Successful ultrasound-guided paracentesis yielding 5.6 liters of peritoneal fluid. Procedure performed by Warren Dais, NP Electronically Signed   By: Juliene Balder M.D.   On: 10/30/2023 14:46   DG CHEST PORT 1 VIEW Result Date: 10/30/2023 CLINICAL DATA:  Shortness of breath.  Cough and congestion. EXAM: PORTABLE CHEST 1 VIEW COMPARISON:  10/28/2023 FINDINGS: Bandlike densities compatible with scarring or atelectasis in the right mid to lower lung. Prior CABG. Lower cervical plate and screw fixator. Heart size within normal limits for technique. Mild lower thoracic spondylosis. Degenerative glenohumeral arthropathy, left greater than right. Spurring of the right AC joint. IMPRESSION: 1. Bandlike densities compatible with scarring or atelectasis in the right mid to  lower lung. 2. Prior CABG. 3. Degenerative glenohumeral arthropathy, left greater than right. Spurring of the right AC joint. Electronically Signed   By: Ryan Salvage M.D.   On: 10/30/2023 09:52    Medications:   allopurinol   200 mg Oral Daily   calcitRIOL   0.5 mcg Oral Once per day on Monday Wednesday Friday   Chlorhexidine  Gluconate Cloth  6 each Topical Q0600   chlorpheniramine-HYDROcodone   5 mL Oral Q12H   doxycycline   100 mg Oral BID   guaiFENesin   1,200 mg Oral BID   heparin   5,000 Units Subcutaneous Q8H   insulin  aspart  0-5 Units Subcutaneous QHS   insulin  aspart  0-6 Units Subcutaneous TID WC   levothyroxine   112 mcg Oral Q0600   modafinil   200 mg Oral Daily   pantoprazole   40 mg Oral Daily   pregabalin   150 mg Oral TID   propranolol   40 mg Oral Daily   rosuvastatin   10 mg Oral Daily   sevelamer  carbonate  800 mg Oral TID with meals   sodium chloride  flush  3 mL Intravenous Q12H    Dialysis Orders: TTS [facility] 3h  B400   87.9kg  2K bath  AVF  Heparin  none Last OP HD 7/22, post wt 98kg (10kg over) Coming off 5-10 kg over after rx's for last 3 weeks  Home bp meds: Propranolol  40mg  every day Demadex  20mg  bid (not taking) Others: statin, renvela , PPI, ozempic , T4, rocaltrol  0.5mcg tts, zyloprim   Assessment/Plan: SOB/ PNA: started on IV abx in ED 7/22, per pmd, poss vol also Vol overload: has sig LE, hip and abd wall edema. May be part of #1, coming off 6-10kg over recently. S/p para yesterday with 5.6L removed. Push UF with HD ESRD: on HD TTS. Last HD 7/22. Received HD yesterday and 3.5L removed. Next HD 7/25. LUE swelling: Per VVS, ok to use AVF. Plan for outpatient F'gram HTN: bp's stable, on inderal  alone. Continue.  Anemia of esrd:  Dispo: Planned DC after HD. Encouraged patient to continue with his normal HD schedule.  Belvie Och, NP [facility] 10/31/2023,11:19 AM

## 2023-10-31 NOTE — Progress Notes (Addendum)
 Received patient in bed to unit.  Alert and oriented.  Informed consent signed and in chart.   TX duration:3.5 hours  Patient tolerated well.  Transported back to the room  Alert, without acute distress.  Hand-off given to patient's nurse.   Access used: Left Upper arm Fistula  Access issues: none  Total UF removed: 4L Medication(s) given: none  Temperature:98.2 degree F   10/31/23 1215  Vitals  BP (!) 111/46  MAP (mmHg) 65  Pulse Rate 65  ECG Heart Rate 75  Resp 18  Oxygen  Therapy  SpO2 97 %  O2 Device Room Air  During Treatment Monitoring  Duration of HD Treatment -hour(s) 3.5 hour(s)  Post Treatment  Dialyzer Clearance Clear  Liters Processed 84  Fluid Removed (mL) 4000 mL  Tolerated HD Treatment Yes     Camellia Brasil LPN [facility]

## 2023-11-01 ENCOUNTER — Telehealth: Payer: Self-pay | Admitting: Nurse Practitioner

## 2023-11-01 NOTE — Telephone Encounter (Signed)
 TCM call Successful I was able to speak to [name] on the phone. He was able to attend his regularly scheduled HD today. PHD

## 2023-11-02 LAB — BODY FLUID CULTURE W GRAM STAIN
Culture: NO GROWTH
Gram Stain: NONE SEEN

## 2023-11-02 LAB — LIPASE, FLUID: Lipase-Fluid: 57 U/L

## 2023-11-03 ENCOUNTER — Telehealth: Payer: Self-pay

## 2023-11-03 LAB — CULTURE, BLOOD (ROUTINE X 2)
Culture: NO GROWTH
Culture: NO GROWTH

## 2023-11-03 NOTE — Transitions of Care (Post Inpatient/ED Visit) (Signed)
 11/03/2023  Name: [name] MRN: [id number] DOB: [birth year]  Today's TOC FU Call Status: Today's TOC FU Call Status:: Successful TOC FU Call Completed TOC FU Call Complete Date: 11/03/23 Patient's Name and Date of Birth confirmed.  Transition Care Management Follow-up Telephone Call Date of Discharge: 10/31/23 Discharge Facility: [facility]) Type of Discharge: Inpatient Admission Primary Inpatient Discharge Diagnosis:: pneumonia How have you been since you were released from the hospital?: Better Any questions or concerns?: No  Items Reviewed: Did you receive and understand the discharge instructions provided?: Yes Medications obtained,verified, and reconciled?: Yes (Medications Reviewed) Any new allergies since your discharge?: No Dietary orders reviewed?: Yes Do you have support at home?: Yes People in Home [RPT]: spouse  Medications Reviewed Today: Medications Reviewed Today     Reviewed by Emmitt Pan, LPN (Licensed Practical Nurse) on 11/03/23 at 1120  Med List Status: <None>   Medication Order Taking? Sig Documenting Provider Last Dose Status Informant  acetaminophen  (TYLENOL ) 325 MG tablet [id number] Yes Take 2 tablets (650 mg total) by mouth every 4 (four) hours as needed for fever or mild pain. Pegge Toribio PARAS, PA-C  Active Self, Pharmacy Records  albuterol  (VENTOLIN  HFA) [phone] Base) MCG/ACT inhaler [id number] Yes Inhale 2 puffs into the lungs every 6 (six) hours as needed for shortness of breath or wheezing. [provider]  Active Self, Pharmacy Records  allopurinol  (ZYLOPRIM ) 100 MG tablet [id number] Yes Take 2 tablets (200 mg total) by mouth daily. Pegge Toribio PARAS, PA-C  Active Self, Pharmacy Records  benzonatate  (TESSALON ) 100 MG capsule [id number] Yes Take 1 capsule (100 mg total) by mouth 3 (three) times daily as needed for cough. Sheikh, Omair [place], DO  Active   calcitRIOL  (ROCALTROL ) 0.5 MCG capsule [id number] Yes Take 1 capsule (0.5 mcg  total) by mouth 3 (three) times a week. Sheikh, Omair [place], DO  Active   chlorpheniramine-HYDROcodone  (TUSSIONEX) 10-8 MG/5ML [id number] Yes Take 5 mLs by mouth every 12 (twelve) hours as needed for cough. Sheikh, Omair [place], DO  Active   doxycycline  (VIBRA -TABS) 100 MG tablet [id number] Yes Take 1 tablet (100 mg total) by mouth 2 (two) times daily for 5 days. Sheikh, Omair [place], DO  Active   guaiFENesin  (MUCINEX ) 600 MG 12 hr tablet [id number] Yes Take 1 tablet (600 mg total) by mouth 2 (two) times daily for 5 days. Sheikh, Omair [place], [place]  Active   levothyroxine  (SYNTHROID ) 112 MCG tablet [id number] Yes Take 1 tablet (112 mcg total) by mouth daily before breakfast. Pegge Toribio PARAS, PA-C  Active Pharmacy Records, Self  Methoxy PEG-Epoetin Beta [facility] IJ) [id number]  Inject into the skin.  Patient not taking: Reported on 11/03/2023   [provider]  Active Self, Pharmacy Records  modafinil  (PROVIGIL ) 200 MG tablet [id number] Yes Take 1 tablet by mouth once daily Raulkar, Sven SQUIBB, MD  Active Self, Pharmacy Records  OZEMPIC , 0.25 OR 0.5 MG/DOSE, 2 MG/3ML SOPN [id number] Yes Inject 0.25 mg into the skin once a week. [provider]  Active Pharmacy Records, Self           Med Note (SATTERFIELD, TEENA BRAVO   Thu Oct 30, 2023 10:05 PM) Patient verified he is still taking just has not taken a dose in over a month. No specific day per patient   pantoprazole  (PROTONIX ) 40 MG tablet [id number] Yes Take 1 tablet (40 mg total) by mouth daily. [name], NP  Active Self, Pharmacy Records  pregabalin  (LYRICA ) 150 MG capsule  [id number] Yes Take 1 capsule (150 mg total) by mouth 3 (three) times daily. [name], NP  Active Self, Pharmacy Records  propranolol  (INDERAL ) 40 MG tablet [id number] Yes Take 40 mg by mouth daily. [provider]  Active Self, Pharmacy Records  rosuvastatin  (CRESTOR ) 10 MG tablet [id number] Yes Take 1 tablet (10 mg total) by mouth daily.  Pegge Toribio PARAS, PA-C  Active Self, Pharmacy Records  sevelamer  carbonate (RENVELA ) 800 MG tablet [id number] Yes Take 800 mg by mouth 3 (three) times daily. [provider]  Active Self, Pharmacy Records  torsemide  (DEMADEX ) 20 MG tablet [id number]  Take 1 tablet (20 mg total) by mouth 2 (two) times daily.  Patient not taking: Reported on 11/03/2023   Ladona Heinz, MD  Active Self, Pharmacy Records  Med List Note Regino, Hunter BIRCH, CPhT 08/18/21 2210): Home dialysis [facility] and Equipment/Supplies: Were Home Health Services Ordered?: NA Any new equipment or medical supplies ordered?: NA  Functional Questionnaire: Do you need assistance with bathing/showering or dressing?: No Do you need assistance with meal preparation?: No Do you need assistance with eating?: No Do you have difficulty maintaining continence: No Do you need assistance with getting out of bed/getting out of a chair/moving?: No Do you have difficulty managing or taking your medications?: No  Follow up appointments reviewed: PCP Follow-up appointment confirmed?: Yes Date of PCP follow-up appointment?: 11/04/23 Follow-up Provider: [facility] Follow-up appointment confirmed?: NA Do you need transportation to your follow-up appointment?: No Do you understand care options if your condition(s) worsen?: Yes-patient verbalized understanding    SIGNATURE Julian Lemmings, LPN [facility] Nurse Health Advisor Direct Dial [phone]

## 2023-11-04 ENCOUNTER — Ambulatory Visit: Admitting: Family Medicine

## 2023-11-04 ENCOUNTER — Encounter: Payer: Self-pay | Admitting: Family Medicine

## 2023-11-04 VITALS — BP 160/68 | HR 81 | Temp 97.4°F

## 2023-11-04 DIAGNOSIS — E039 Hypothyroidism, unspecified: Secondary | ICD-10-CM

## 2023-11-04 DIAGNOSIS — D638 Anemia in other chronic diseases classified elsewhere: Secondary | ICD-10-CM

## 2023-11-04 DIAGNOSIS — N186 End stage renal disease: Secondary | ICD-10-CM

## 2023-11-04 DIAGNOSIS — J9601 Acute respiratory failure with hypoxia: Secondary | ICD-10-CM

## 2023-11-04 DIAGNOSIS — I4821 Permanent atrial fibrillation: Secondary | ICD-10-CM | POA: Diagnosis not present

## 2023-11-04 DIAGNOSIS — J189 Pneumonia, unspecified organism: Secondary | ICD-10-CM

## 2023-11-04 DIAGNOSIS — Z992 Dependence on renal dialysis: Secondary | ICD-10-CM

## 2023-11-04 LAB — ANAEROBIC CULTURE W GRAM STAIN
Culture: NO GROWTH
Gram Stain: NONE SEEN

## 2023-11-04 MED ORDER — HYDROCODONE BIT-HOMATROP MBR 5-1.5 MG/5ML PO SOLN
5.0000 mL | Freq: Four times a day (QID) | ORAL | 0 refills | Status: DC | PRN
Start: 2023-11-04 — End: 2024-01-08

## 2023-11-04 NOTE — Progress Notes (Signed)
 Established Patient Office Visit  Subjective   Patient ID: [name], male    DOB: 1950/08/02  Age: 73 y.o. MRN: [id number]  No chief complaint on file.   HPI   [name] has complex past medical history and is seen for hospital follow-up following recent admission for hypoxic respiratory failure with volume overload and possible pneumonia.  His chronic problems include history of CAD with prior bypass, hypertension, atrial fibrillation, history of GI bleed excluding use of chronic anticoagulants, end-stage renal disease on hemodialysis, history of bilateral below-knee amputations, type 2 diabetes, history of prostate cancer.  He has frequent volume overload and actually has to get paracentesis about 5 to 7 L every couple of weeks.  Recently presented to ED because of shortness of breath.  He had a cough that was starting about 24 hours prior to admission and increased edema in his thighs.  Was admitted for acute hypoxic respiratory failure with bibasilar pneumonia and volume overload.  Patient also had recent outflow vein stenosis of his AV fistula and was consulted on by vascular.  During hospitalization had paracentesis with removal of 5.6 L.  It was felt that his respiratory failure was more likely related to volume overload than pneumonia though he was discharged on doxycycline  100 mg twice daily.  Denies any fever since discharge.  Still has some cough.  Requesting refill of cough medication.  Has taken Hycodan in the past.    Had A1c of 4.9%.  Follow-up portable chest x-ray in hospital did not show any clear infiltrates.  Past Medical History:  Diagnosis Date   Acute osteomyelitis of metatarsal bone of left foot (HCC)    Ambulates with cane    straight cane   Anemia    Cervical myelopathy (HCC) 02/06/2018   Chronic kidney disease    dailysis [facility]- home   Community acquired pneumonia of left lower lobe of lung 08/18/2021   Complication of anesthesia    Coronary artery disease     Dehiscence of amputation stump of left lower extremity (HCC)    Diabetes mellitus without complication (HCC)    type2   Diabetic foot ulcer (HCC)    Diabetic neuropathy (HCC) 02/06/2018   Dysrhythmia    Gait abnormality 08/24/2018   GERD (gastroesophageal reflux disease)     01/06/20- not current   Gout    History of blood transfusion    History of blood transfusion    History of kidney stones    passed stones   Hypercholesteremia    Hypertension    Hypothyroidism    Neuromuscular disorder (HCC)    neuropathy left leg and bilateral feet   Neuropathy    Partial nontraumatic amputation of foot, 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   PONV (postoperative nausea and vomiting)    Prostate cancer (HCC)    PVD (peripheral vascular disease) (HCC)    with amputations   Past Surgical History:  Procedure Laterality Date   A-FLUTTER ABLATION N/A 04/06/2020   Procedure: A-FLUTTER ABLATION;  Surgeon: Waddell Danelle ORN, MD;  Location: [facility] INVASIVE CV LAB;  Service: Cardiovascular;  Laterality: N/A;   A/V FISTULAGRAM N/A 03/05/2023   Procedure: A/V Fistulagram;  Surgeon: Pearline [name] RAMAN, MD;  Location: [facility] INVASIVE CV LAB;  Service: Vascular;  Laterality: N/A;   AMPUTATION Left 12/25/2013   Procedure: AMPUTATION RAY LEFT 5TH RAY;  Surgeon: Jerona Harden GAILS, MD;  Location: [facility] ORS;  Service: Orthopedics;  Laterality: Left;   AMPUTATION Right 12/15/2018  Procedure: AMPUTATION OF 4TH AND 5TH TOES RIGHT FOOT;  Surgeon: Harden Jerona GAILS, MD;  Location: [facility] OR;  Service: Orthopedics;  Laterality: Right;   AMPUTATION Left 02/02/2021   Procedure: LEFT FOOT 4TH RAY AMPUTATION;  Surgeon: Harden Jerona GAILS, MD;  Location: [facility] OR;  Service: Orthopedics;  Laterality: Left;   AMPUTATION Left 05/09/2021   Procedure: LEFT BELOW KNEE AMPUTATION;  Surgeon: Harden Jerona GAILS, MD;  Location: [facility] OR;  Service: Orthopedics;  Laterality: Left;   AMPUTATION Right 07/26/2022   Procedure: RIGHT BELOW KNEE AMPUTATION;  Surgeon: Harden Jerona GAILS, MD;   Location: [facility] OR;  Service: Orthopedics;  Laterality: Right;   APPLICATION OF WOUND VAC Right 12/15/2018   Procedure: APPLICATION OF WOUND VAC;  Surgeon: Harden Jerona GAILS, MD;  Location: [facility] OR;  Service: Orthopedics;  Laterality: Right;   AV FISTULA PLACEMENT Left 03/25/2019   Procedure: LEFT ARM ARTERIOVENOUS (AV) FISTULA CREATION;  Surgeon: Serene Gaile ORN, MD;  Location: [facility] OR;  Service: Vascular;  Laterality: Left;   BACK SURGERY     BASCILIC VEIN TRANSPOSITION Left 05/20/2019   Procedure: SECOND STAGE LEFT BASCILIC VEIN TRANSPOSITION;  Surgeon: Serene Gaile ORN, MD;  Location: [facility] OR;  Service: Vascular;  Laterality: Left;   BIOPSY  06/22/2022   Procedure: BIOPSY;  Surgeon: Wilhelmenia Aloha Raddle., MD;  Location: [facility] ENDOSCOPY;  Service: Gastroenterology;;   CARDIAC CATHETERIZATION  02/17/2014   CATARACT EXTRACTION Bilateral    CHOLECYSTECTOMY     COLONOSCOPY  2011   in [place], [place]   COLONOSCOPY N/A 06/23/2022   Procedure: COLONOSCOPY;  Surgeon: Legrand Victory LITTIE DOUGLAS, MD;  Location: [facility] ENDOSCOPY;  Service: Gastroenterology;  Laterality: N/A;   CORONARY ARTERY BYPASS GRAFT  2008   CYSTOSCOPY N/A 06/29/2020   Procedure: CYSTOSCOPY WITH CLOT EVACUATION AND  FULGERATION;  Surgeon: Matilda Senior, MD;  Location: [facility] OR;  Service: Urology;  Laterality: N/A;   CYSTOSCOPY WITH FULGERATION Bilateral 06/17/2020   Procedure: CYSTOSCOPY,BILATERAL RETROGRADE, CLOT EVACUATION WITH FULGERATION OF THE BLADDER;  Surgeon: Selma Donnice SAUNDERS, MD;  Location: [facility] OR;  Service: Urology;  Laterality: Bilateral;   CYSTOSCOPY WITH FULGERATION N/A 06/28/2020   Procedure: CYSTOSCOPY WITH CLOT EVACUATION AND FULGERATION OF BLEEDERS;  Surgeon: Matilda Senior, MD;  Location: [facility] OR;  Service: Urology;  Laterality: N/A;  1 HR   ENTEROSCOPY N/A 06/22/2022   Procedure: ENTEROSCOPY;  Surgeon: Wilhelmenia Aloha Raddle., MD;  Location: [facility] ENDOSCOPY;  Service: Gastroenterology;  Laterality: N/A;   HEMOSTASIS CLIP  PLACEMENT  06/22/2022   Procedure: HEMOSTASIS CLIP PLACEMENT;  Surgeon: Wilhelmenia Aloha Raddle., MD;  Location: [facility] ENDOSCOPY;  Service: Gastroenterology;;   HOT HEMOSTASIS N/A 06/22/2022   Procedure: HOT HEMOSTASIS (ARGON PLASMA COAGULATION/BICAP);  Surgeon: Wilhelmenia Aloha Raddle., MD;  Location: [facility] ENDOSCOPY;  Service: Gastroenterology;  Laterality: N/A;   I & D EXTREMITY Right 12/15/2018   Procedure: DEBRIDEMENT RIGHT FOOT;  Surgeon: Harden Jerona GAILS, MD;  Location: [facility] OR;  Service: Orthopedics;  Laterality: Right;   I & D EXTREMITY Right 08/27/2019   Procedure: PARTIAL CUBOID EXCISION RIGHT FOOT;  Surgeon: Harden Jerona GAILS, MD;  Location: [facility] OR;  Service: Orthopedics;  Laterality: Right;   I & D EXTREMITY Right 01/07/2020   Procedure: RIGHT FOOT EXCISION INFECTED BONE;  Surgeon: Harden Jerona GAILS, MD;  Location: [facility] OR;  Service: Orthopedics;  Laterality: Right;   I & D EXTREMITY Right 05/17/2022   Procedure: IRRIGATION AND DEBRIDEMENT RIGHT FOOT ABSCESS;  Surgeon: Harden Jerona GAILS, MD;  Location: [facility] OR;  Service: Orthopedics;  Laterality: Right;   IR PARACENTESIS  10/11/2022   IR PARACENTESIS  12/24/2022   IR PARACENTESIS  02/06/2023   IR PARACENTESIS  05/05/2023   IR PARACENTESIS  05/19/2023   IR PARACENTESIS  07/11/2023   IR PARACENTESIS  07/30/2023   IR PARACENTESIS  08/20/2023   IR PARACENTESIS  09/19/2023   IR PARACENTESIS  10/03/2023   IR PARACENTESIS  10/17/2023   IR PARACENTESIS  10/30/2023   NECK SURGERY     novemver 2019   PERIPHERAL VASCULAR BALLOON ANGIOPLASTY Left 03/05/2023   Procedure: PERIPHERAL VASCULAR BALLOON ANGIOPLASTY;  Surgeon: Pearline [name] RAMAN, MD;  Location: [facility] INVASIVE CV LAB;  Service: Vascular;  Laterality: Left;  AVF   POLYPECTOMY  06/22/2022   Procedure: POLYPECTOMY;  Surgeon: Mansouraty, Aloha Raddle., MD;  Location: [facility] ENDOSCOPY;  Service: Gastroenterology;;   POLYPECTOMY  06/23/2022   Procedure: POLYPECTOMY;  Surgeon: Legrand Victory LITTIE DOUGLAS, MD;  Location: [facility]  ENDOSCOPY;  Service: Gastroenterology;;   STUMP REVISION Left 06/27/2021   Procedure: REVISION LEFT BELOW KNEE AMPUTATION;  Surgeon: Harden Jerona GAILS, MD;  Location: [facility] OR;  Service: Orthopedics;  Laterality: Left;   SUBMUCOSAL LIFTING INJECTION  06/22/2022   Procedure: SUBMUCOSAL LIFTING INJECTION;  Surgeon: Wilhelmenia Aloha Raddle., MD;  Location: [facility] ENDOSCOPY;  Service: Gastroenterology;;   SUBMUCOSAL TATTOO INJECTION  06/22/2022   Procedure: SUBMUCOSAL TATTOO INJECTION;  Surgeon: Wilhelmenia Aloha Raddle., MD;  Location: [facility] ENDOSCOPY;  Service: Gastroenterology;;   TRANSURETHRAL RESECTION OF PROSTATE N/A 06/28/2020   Procedure: TRANSURETHRAL RESECTION OF THE PROSTATE (TURP);  Surgeon: Matilda Senior, MD;  Location: [facility] OR;  Service: Urology;  Laterality: N/A;   WISDOM TOOTH EXTRACTION      reports that he quit smoking about 34 years ago. His smoking use included cigars. He started smoking about 50 years ago. He has never used smokeless tobacco. He reports that he does not drink alcohol  and does not use drugs. family history includes CAD in his father; Cancer in his father; Diabetes Mellitus II in his brother, brother, father, and mother; Kidney disease in his brother, brother, father, and mother; Stomach cancer (age of onset: 32) in his brother. Allergies  Allergen Reactions   Morphine  Other (See Comments)    Other reaction(s): Delusions (intolerance)   Mushroom Extract Complex (Obsolete) Nausea Only    Review of Systems  Constitutional:  Negative for chills and fever.  Eyes:  Negative for blurred vision.  Respiratory:  Positive for cough. Negative for wheezing.   Cardiovascular:  Negative for chest pain.  Gastrointestinal:  Negative for abdominal pain.  Neurological:  Negative for focal weakness.      Objective:     BP (!) 160/68 (BP Location: Right Arm, Patient Position: Sitting, Cuff Size: Normal)   Pulse 81   Temp (!) 97.4 F (36.3 C) (Oral)   SpO2 96%  BP Readings from  Last 3 Encounters:  11/04/23 (!) 160/68  10/31/23 (!) 111/46  10/03/23 (!) 150/67   Wt Readings from Last 3 Encounters:  10/31/23 200 lb 13.4 oz (91.1 kg)  09/15/23 198 lb 6.6 oz (90 kg)  06/02/23 200 lb (90.7 kg)      Physical Exam Vitals reviewed.  Constitutional:      General: He is not in acute distress.    Appearance: He is not ill-appearing.  Cardiovascular:     Rate and Rhythm: Normal rate.  Pulmonary:     Effort: Pulmonary effort is normal.     Breath sounds: Normal breath sounds.  No wheezing or rales.  Musculoskeletal:     Comments: History of bilateral below-knee amputations.  Neurological:     General: No focal deficit present.     Mental Status: He is alert.      No results found for any visits on 11/04/23.  Last CBC Lab Results  Component Value Date   WBC 4.7 10/31/2023   HGB 10.4 (L) 10/31/2023   HCT 33.2 (L) 10/31/2023   MCV 84.1 10/31/2023   MCH 26.3 10/31/2023   RDW 18.6 (H) 10/31/2023   PLT 91 (L) 10/31/2023   Last metabolic panel Lab Results  Component Value Date   GLUCOSE 155 (H) 10/31/2023   GLUCOSE 155 (H) 10/31/2023   NA 137 10/31/2023   NA 139 10/31/2023   K 4.7 10/31/2023   K 4.6 10/31/2023   CL 99 10/31/2023   CL 101 10/31/2023   CO2 26 10/31/2023   CO2 26 10/31/2023   BUN 45 (H) 10/31/2023   BUN 44 (H) 10/31/2023   CREATININE 6.00 (H) 10/31/2023   CREATININE 5.75 (H) 10/31/2023   GFRNONAA 9 (L) 10/31/2023   GFRNONAA 10 (L) 10/31/2023   CALCIUM  8.0 (L) 10/31/2023   CALCIUM  7.9 (L) 10/31/2023   PHOS 4.4 10/31/2023   PHOS 4.3 10/31/2023   PROT 4.4 (L) 10/31/2023   ALBUMIN  1.9 (L) 10/31/2023   ALBUMIN  1.9 (L) 10/31/2023   LABGLOB 2.2 02/06/2018   BILITOT 0.9 10/31/2023   ALKPHOS 152 (H) 10/31/2023   AST 17 10/31/2023   ALT 9 10/31/2023   ANIONGAP 12 10/31/2023   ANIONGAP 12 10/31/2023   Last lipids Lab Results  Component Value Date   TRIG 142 07/24/2022   Last hemoglobin A1c Lab Results  Component Value Date    HGBA1C 4.9 10/29/2023   Last thyroid  functions Lab Results  Component Value Date   TSH 1.546 05/05/2023      The ASCVD Risk score (Arnett DK, et al., 2019) failed to calculate for the following reasons:   The valid total cholesterol range is 130 to 320 mg/dL    Assessment & Plan:   #1 recent acute respiratory failure with hypoxia in setting of suspected community-acquired pneumonia and volume overload from CHF.  Treated with antibiotics and remains on doxycycline .  No fever.  Still has some lingering cough.  Pulse oximetry today room air 96%.  No rales.  No wheezes.  Refill Hycodan cough syrup to use 1 teaspoon every 6 hours as a for severe cough.  Follow-up promptly for any fever.  Finish out doxycycline .  #2 end-stage renal disease on hemodialysis Tuesday, Thursday, Saturday.  Continue close follow-up with nephrology.  #3 permanent atrial fibrillation.  Past history of GI bleed which excludes anticoagulation.  #4 chronic anemia related to end-stage renal disease stable by recent labs.  #5 history of CAD with prior CABG age 61  #6 hypothyroidism treated with levothyroxine  112 mcg daily.  #7 recent outflow vein stenosis AV fistula.  He has follow-up with vascular surgeon this Friday.  Wolm Scarlet, MD

## 2023-11-04 NOTE — Patient Instructions (Signed)
 I will send in the cough medication- Hycodan  Follow up promptly for any fever.

## 2023-11-05 ENCOUNTER — Encounter: Payer: Self-pay | Admitting: Family Medicine

## 2023-11-07 ENCOUNTER — Encounter (HOSPITAL_COMMUNITY)
Admission: RE | Disposition: A | Discharge status: Home / Self Care | Payer: Self-pay | Source: Home / Self Care | Attending: Vascular Surgery

## 2023-11-07 ENCOUNTER — Ambulatory Visit (HOSPITAL_COMMUNITY)
Admission: RE | Admit: 2023-11-07 | Discharge: 2023-11-07 | Disposition: A | Discharge status: Home / Self Care | Attending: Vascular Surgery | Admitting: Vascular Surgery

## 2023-11-07 ENCOUNTER — Other Ambulatory Visit: Payer: Self-pay

## 2023-11-07 DIAGNOSIS — Z87891 Personal history of nicotine dependence: Secondary | ICD-10-CM | POA: Diagnosis not present

## 2023-11-07 DIAGNOSIS — N186 End stage renal disease: Secondary | ICD-10-CM | POA: Insufficient documentation

## 2023-11-07 DIAGNOSIS — Z7985 Long-term (current) use of injectable non-insulin antidiabetic drugs: Secondary | ICD-10-CM | POA: Diagnosis not present

## 2023-11-07 DIAGNOSIS — T82858A Stenosis of vascular prosthetic devices, implants and grafts, initial encounter: Secondary | ICD-10-CM | POA: Diagnosis present

## 2023-11-07 DIAGNOSIS — Y832 Surgical operation with anastomosis, bypass or graft as the cause of abnormal reaction of the patient, or of later complication, without mention of misadventure at the time of the procedure: Secondary | ICD-10-CM | POA: Insufficient documentation

## 2023-11-07 DIAGNOSIS — Z79899 Other long term (current) drug therapy: Secondary | ICD-10-CM | POA: Insufficient documentation

## 2023-11-07 DIAGNOSIS — Z992 Dependence on renal dialysis: Secondary | ICD-10-CM | POA: Diagnosis not present

## 2023-11-07 DIAGNOSIS — I120 Hypertensive chronic kidney disease with stage 5 chronic kidney disease or end stage renal disease: Secondary | ICD-10-CM | POA: Insufficient documentation

## 2023-11-07 DIAGNOSIS — E1122 Type 2 diabetes mellitus with diabetic chronic kidney disease: Secondary | ICD-10-CM | POA: Insufficient documentation

## 2023-11-07 HISTORY — PX: A/V SHUNT INTERVENTION: CATH118220

## 2023-11-07 HISTORY — PX: VENOUS ANGIOPLASTY: CATH118376

## 2023-11-07 LAB — GLUCOSE, CAPILLARY: Glucose-Capillary: 80 mg/dL (ref 70–99)

## 2023-11-07 SURGERY — A/V SHUNT INTERVENTION
Anesthesia: LOCAL | Site: Arm Upper | Laterality: Left

## 2023-11-07 MED ORDER — HEPARIN (PORCINE) IN NACL 1000-0.9 UT/500ML-% IV SOLN
INTRAVENOUS | Status: DC | PRN
  Administered 2023-11-07: 500 mL

## 2023-11-07 MED ORDER — IODIXANOL 320 MG/ML IV SOLN
INTRAVENOUS | Status: DC | PRN
  Administered 2023-11-07: 25 mL via INTRAVENOUS

## 2023-11-07 MED ORDER — LIDOCAINE HCL (PF) 1 % IJ SOLN
INTRAMUSCULAR | Status: AC
Start: 2023-11-07 — End: 2023-11-07
  Filled 2023-11-07: qty 30

## 2023-11-07 MED ORDER — LIDOCAINE HCL (PF) 1 % IJ SOLN
INTRAMUSCULAR | Status: DC | PRN
  Administered 2023-11-07: 2 mL

## 2023-11-07 SURGICAL SUPPLY — 10 items
BALLOON ATLAS 14X40X75 (BALLOONS) IMPLANT
BALLOON MUSTANG 9X60X75 (BALLOONS) IMPLANT
KIT ENCORE 26 ADVANTAGE (KITS) IMPLANT
KIT MICROPUNCTURE NIT STIFF (SHEATH) IMPLANT
SHEATH PINNACLE R/O II 7F 4CM (SHEATH) IMPLANT
SHEATH PROBE COVER 6X72 (BAG) IMPLANT
STOPCOCK MORSE 400PSI 3WAY (MISCELLANEOUS) IMPLANT
TRAY PV CATH (CUSTOM PROCEDURE TRAY) ×2 IMPLANT
TUBING CIL FLEX 10 FLL-RA (TUBING) IMPLANT
WIRE BENTSON .035X145CM (WIRE) IMPLANT

## 2023-11-07 NOTE — Op Note (Signed)
    Patient name: [name] MRN: [id number] DOB: 08-28-1950 Sex: male  11/07/2023 Pre-operative Diagnosis: Malfunction of left brachiobasilic AV fistula with arm swelling Post-operative diagnosis:  Same Surgeon:  Lonni DOROTHA Gaskins, MD Procedure Performed: 1.  Ultrasound-guided access left brachiobasilic AV fistula 2.  Left upper extremity fistulogram including central venogram 3.  Left central venoplasty subclavian vein (14 mm x 40 mm Atlas) 4.  Left peripheral venoplasty basilic vein mid upper arm (9 mm x 60 mm Mustang)  Indications: Patient is a 73 year old male with end-stage renal disease using a left arm basilic vein fistula.  He presents with several months of arm swelling.  He has a history of central venous stenosis.  Presents for fistulogram after risk-benefits discussed.  Findings:   Ultrasound-guided access left basilic vein fistula.  Centrally there was about a 70% left subclavian stenosis that was treated with a 14 mm Atlas with good results.  Peripherally there was about a 50% stenosis in the mid upper arm basilic vein treated with a 9 mm Mustang.  Widely patent fistula at completion.   Procedure:  The patient was identified in the holding area and taken to [facility] lab.  Placed on the table in the supine position.  The left arm was prepped draped standard sterile fashion.  Timeout was performed.  Ultimately the left arm fistula was evaluated with ultrasound, it was patent, an image was saved.  This was then treated with 1% lidocaine  without epinephrine  and I used ultrasound to access the fistula with micro access needle and placed a microwire and micro sheath.  Left upper extremity fistulogram was then obtained including central venogram.  Elected for intervention.  A Bentson wire was used and exchanged for short 8 [place] sheath.  I then went centrally and treated subclavian vein stenosis with an 14 mm x 40 mm Atlas to nominal pressure for 2 minutes.  Much better results with no  significant residual stenosis..  I then treated the peripheral stenosis with a 9 mm x 60 mm Mustang for 2 minutes.  Widely patent fistula at completion.  Wires and catheters were removed.  Pursestring was tied around the sheath.    Lonni DOROTHA Gaskins, MD [facility] Office: [phone]

## 2023-11-07 NOTE — H&P (Signed)
 H&P     MRN #:  [id number]  History of Present Illness: This is a 73 y.o. male with end-stage disease that presents for left arm fistulogram.  States his left arm is been swelling for the last several months.  Has a basilic vein fistula.  Previous history of left subclavian vein stenosis  Past Medical History:  Diagnosis Date   Acute osteomyelitis of metatarsal bone of left foot (HCC)    Ambulates with cane    straight cane   Anemia    Cervical myelopathy (HCC) 02/06/2018   Chronic kidney disease    dailysis [facility]- home   Community acquired pneumonia of left lower lobe of lung 08/18/2021   Complication of anesthesia    Coronary artery disease    Dehiscence of amputation stump of left lower extremity (HCC)    Diabetes mellitus without complication (HCC)    type2   Diabetic foot ulcer (HCC)    Diabetic neuropathy (HCC) 02/06/2018   Dysrhythmia    Gait abnormality 08/24/2018   GERD (gastroesophageal reflux disease)     01/06/20- not current   Gout    History of blood transfusion    History of blood transfusion    History of kidney stones    passed stones   Hypercholesteremia    Hypertension    Hypothyroidism    Neuromuscular disorder (HCC)    neuropathy left leg and bilateral feet   Neuropathy    Partial nontraumatic amputation of foot, 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   PONV (postoperative nausea and vomiting)    Prostate cancer (HCC)    PVD (peripheral vascular disease) (HCC)    with amputations    Past Surgical History:  Procedure Laterality Date   A-FLUTTER ABLATION N/A 04/06/2020   Procedure: A-FLUTTER ABLATION;  Surgeon: Waddell Danelle ORN, MD;  Location: [facility] INVASIVE CV LAB;  Service: Cardiovascular;  Laterality: N/A;   A/V FISTULAGRAM N/A 03/05/2023   Procedure: A/V Fistulagram;  Surgeon: Pearline [name] RAMAN, MD;  Location: [facility] INVASIVE CV LAB;  Service: Vascular;  Laterality: N/A;   AMPUTATION Left 12/25/2013   Procedure: AMPUTATION RAY LEFT 5TH RAY;  Surgeon: Jerona Harden GAILS,  MD;  Location: [facility] ORS;  Service: Orthopedics;  Laterality: Left;   AMPUTATION Right 12/15/2018   Procedure: AMPUTATION OF 4TH AND 5TH TOES RIGHT FOOT;  Surgeon: Harden Jerona GAILS, MD;  Location: [facility] OR;  Service: Orthopedics;  Laterality: Right;   AMPUTATION Left 02/02/2021   Procedure: LEFT FOOT 4TH RAY AMPUTATION;  Surgeon: Harden Jerona GAILS, MD;  Location: [facility] OR;  Service: Orthopedics;  Laterality: Left;   AMPUTATION Left 05/09/2021   Procedure: LEFT BELOW KNEE AMPUTATION;  Surgeon: Harden Jerona GAILS, MD;  Location: [facility] OR;  Service: Orthopedics;  Laterality: Left;   AMPUTATION Right 07/26/2022   Procedure: RIGHT BELOW KNEE AMPUTATION;  Surgeon: Harden Jerona GAILS, MD;  Location: [facility] OR;  Service: Orthopedics;  Laterality: Right;   APPLICATION OF WOUND VAC Right 12/15/2018   Procedure: APPLICATION OF WOUND VAC;  Surgeon: Harden Jerona GAILS, MD;  Location: [facility] OR;  Service: Orthopedics;  Laterality: Right;   AV FISTULA PLACEMENT Left 03/25/2019   Procedure: LEFT ARM ARTERIOVENOUS (AV) FISTULA CREATION;  Surgeon: Serene Gaile ORN, MD;  Location: [facility] OR;  Service: Vascular;  Laterality: Left;   BACK SURGERY     BASCILIC VEIN TRANSPOSITION Left 05/20/2019   Procedure: SECOND STAGE LEFT BASCILIC VEIN TRANSPOSITION;  Surgeon: Serene Gaile ORN, MD;  Location: [facility] OR;  Service: Vascular;  Laterality:  Left;   BIOPSY  06/22/2022   Procedure: BIOPSY;  Surgeon: Wilhelmenia Aloha Raddle., MD;  Location: [facility] ENDOSCOPY;  Service: Gastroenterology;;   CARDIAC CATHETERIZATION  02/17/2014   CATARACT EXTRACTION Bilateral    CHOLECYSTECTOMY     COLONOSCOPY  2011   in [place], Normal   COLONOSCOPY N/A 06/23/2022   Procedure: COLONOSCOPY;  Surgeon: Legrand Victory LITTIE DOUGLAS, MD;  Location: [facility] ENDOSCOPY;  Service: Gastroenterology;  Laterality: N/A;   CORONARY ARTERY BYPASS GRAFT  2008   CYSTOSCOPY N/A 06/29/2020   Procedure: CYSTOSCOPY WITH CLOT EVACUATION AND  FULGERATION;  Surgeon: Matilda Senior, MD;  Location: [facility] OR;  Service:  Urology;  Laterality: N/A;   CYSTOSCOPY WITH FULGERATION Bilateral 06/17/2020   Procedure: CYSTOSCOPY,BILATERAL RETROGRADE, CLOT EVACUATION WITH FULGERATION OF THE BLADDER;  Surgeon: Selma Donnice SAUNDERS, MD;  Location: [facility] OR;  Service: Urology;  Laterality: Bilateral;   CYSTOSCOPY WITH FULGERATION N/A 06/28/2020   Procedure: CYSTOSCOPY WITH CLOT EVACUATION AND FULGERATION OF BLEEDERS;  Surgeon: Matilda Senior, MD;  Location: [facility] OR;  Service: Urology;  Laterality: N/A;  1 HR   ENTEROSCOPY N/A 06/22/2022   Procedure: ENTEROSCOPY;  Surgeon: Wilhelmenia Aloha Raddle., MD;  Location: [facility] ENDOSCOPY;  Service: Gastroenterology;  Laterality: N/A;   HEMOSTASIS CLIP PLACEMENT  06/22/2022   Procedure: HEMOSTASIS CLIP PLACEMENT;  Surgeon: Wilhelmenia Aloha Raddle., MD;  Location: [facility] ENDOSCOPY;  Service: Gastroenterology;;   HOT HEMOSTASIS N/A 06/22/2022   Procedure: HOT HEMOSTASIS (ARGON PLASMA COAGULATION/BICAP);  Surgeon: Wilhelmenia Aloha Raddle., MD;  Location: [facility] ENDOSCOPY;  Service: Gastroenterology;  Laterality: N/A;   I & D EXTREMITY Right 12/15/2018   Procedure: DEBRIDEMENT RIGHT FOOT;  Surgeon: Harden Jerona GAILS, MD;  Location: [facility] OR;  Service: Orthopedics;  Laterality: Right;   I & D EXTREMITY Right 08/27/2019   Procedure: PARTIAL CUBOID EXCISION RIGHT FOOT;  Surgeon: Harden Jerona GAILS, MD;  Location: [facility] OR;  Service: Orthopedics;  Laterality: Right;   I & D EXTREMITY Right 01/07/2020   Procedure: RIGHT FOOT EXCISION INFECTED BONE;  Surgeon: Harden Jerona GAILS, MD;  Location: [facility] OR;  Service: Orthopedics;  Laterality: Right;   I & D EXTREMITY Right 05/17/2022   Procedure: IRRIGATION AND DEBRIDEMENT RIGHT FOOT ABSCESS;  Surgeon: Harden Jerona GAILS, MD;  Location: [facility] OR;  Service: Orthopedics;  Laterality: Right;   IR PARACENTESIS  10/11/2022   IR PARACENTESIS  12/24/2022   IR PARACENTESIS  02/06/2023   IR PARACENTESIS  05/05/2023   IR PARACENTESIS  05/19/2023   IR PARACENTESIS  07/11/2023   IR PARACENTESIS   07/30/2023   IR PARACENTESIS  08/20/2023   IR PARACENTESIS  09/19/2023   IR PARACENTESIS  10/03/2023   IR PARACENTESIS  10/17/2023   IR PARACENTESIS  10/30/2023   NECK SURGERY     novemver 2019   PERIPHERAL VASCULAR BALLOON ANGIOPLASTY Left 03/05/2023   Procedure: PERIPHERAL VASCULAR BALLOON ANGIOPLASTY;  Surgeon: Pearline [name] RAMAN, MD;  Location: [facility] INVASIVE CV LAB;  Service: Vascular;  Laterality: Left;  AVF   POLYPECTOMY  06/22/2022   Procedure: POLYPECTOMY;  Surgeon: Mansouraty, Aloha Raddle., MD;  Location: [facility] ENDOSCOPY;  Service: Gastroenterology;;   POLYPECTOMY  06/23/2022   Procedure: POLYPECTOMY;  Surgeon: Legrand Victory LITTIE DOUGLAS, MD;  Location: [facility] ENDOSCOPY;  Service: Gastroenterology;;   STUMP REVISION Left 06/27/2021   Procedure: REVISION LEFT BELOW KNEE AMPUTATION;  Surgeon: Harden Jerona GAILS, MD;  Location: [facility] OR;  Service: Orthopedics;  Laterality: Left;   SUBMUCOSAL LIFTING INJECTION  06/22/2022   Procedure: SUBMUCOSAL LIFTING  INJECTION;  Surgeon: Wilhelmenia Aloha Raddle., MD;  Location: [facility] ENDOSCOPY;  Service: Gastroenterology;;   SUBMUCOSAL TATTOO INJECTION  06/22/2022   Procedure: SUBMUCOSAL TATTOO INJECTION;  Surgeon: Wilhelmenia Aloha Raddle., MD;  Location: [facility] ENDOSCOPY;  Service: Gastroenterology;;   TRANSURETHRAL RESECTION OF PROSTATE N/A 06/28/2020   Procedure: TRANSURETHRAL RESECTION OF THE PROSTATE (TURP);  Surgeon: Matilda Senior, MD;  Location: [facility] OR;  Service: Urology;  Laterality: N/A;   WISDOM TOOTH EXTRACTION      Allergies  Allergen Reactions   Morphine  Other (See Comments)    Other reaction(s): Delusions (intolerance)   Mushroom Extract Complex (Obsolete) Nausea Only    Prior to Admission medications   Medication Sig Start Date End Date Taking? Authorizing Provider  acetaminophen  (TYLENOL ) 325 MG tablet Take 2 tablets (650 mg total) by mouth every 4 (four) hours as needed for fever or mild pain. 08/08/22   Angiulli, Toribio PARAS, PA-C  albuterol  (VENTOLIN  HFA) 108  (90 Base) MCG/ACT inhaler Inhale 2 puffs into the lungs every 6 (six) hours as needed for shortness of breath or wheezing. 05/13/23 11/09/23  [provider]  allopurinol  (ZYLOPRIM ) 100 MG tablet Take 2 tablets (200 mg total) by mouth daily. 08/08/22   Angiulli, Toribio PARAS, PA-C  benzonatate  (TESSALON ) 100 MG capsule Take 1 capsule (100 mg total) by mouth 3 (three) times daily as needed for cough. 10/31/23   Sherrill Cable Latif, DO  calcitRIOL  (ROCALTROL ) 0.5 MCG capsule Take 1 capsule (0.5 mcg total) by mouth 3 (three) times a week. 10/31/23   Sherrill Cable Latif, DO  chlorpheniramine-HYDROcodone  (TUSSIONEX) 10-8 MG/5ML Take 5 mLs by mouth every 12 (twelve) hours as needed for cough. 10/31/23   Sherrill Cable Latif, DO  HYDROcodone  bit-homatropine (HYCODAN) 5-1.5 MG/5ML syrup Take 5 mLs by mouth every 6 (six) hours as needed. 11/04/23   Burchette, Wolm ORN, MD  levothyroxine  (SYNTHROID ) 112 MCG tablet Take 1 tablet (112 mcg total) by mouth daily before breakfast. 05/22/21   Angiulli, Toribio PARAS, PA-C  Methoxy PEG-Epoetin Beta (MIRCERA IJ) Inject into the skin. 08/20/22   [provider]  modafinil  (PROVIGIL ) 200 MG tablet Take 1 tablet by mouth once daily 10/12/23   Raulkar, Krutika P, MD  OZEMPIC , 0.25 OR 0.5 MG/DOSE, 2 MG/3ML SOPN Inject 0.25 mg into the skin once a week. 09/19/21   [provider]  pantoprazole  (PROTONIX ) 40 MG tablet Take 1 tablet (40 mg total) by mouth daily. 09/15/23   [name], NP  pregabalin  (LYRICA ) 150 MG capsule Take 1 capsule (150 mg total) by mouth 3 (three) times daily. 09/15/23   [name], NP  propranolol  (INDERAL ) 40 MG tablet Take 40 mg by mouth daily. 03/24/23   [provider]  rosuvastatin  (CRESTOR ) 10 MG tablet Take 1 tablet (10 mg total) by mouth daily. 08/08/22   Angiulli, Toribio PARAS, PA-C  sevelamer  carbonate (RENVELA ) 800 MG tablet Take 800 mg by mouth 3 (three) times daily. 01/17/23   [provider]  torsemide   (DEMADEX ) 20 MG tablet Take 1 tablet (20 mg total) by mouth 2 (two) times daily. 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  Tobacco Use   Smoking status: Former  Types: Cigars    Start date: 04/08/1973    Quit date: 12/24/1988    Years since quitting: 34.8   Smokeless tobacco: Never   Tobacco comments:    Cigars and Pipe   Vaping Use   Vaping status: Never Used  Substance and Sexual Activity   Alcohol  use: Never   Drug use: Never   Sexual activity: Yes    Partners: Female  Other Topics Concern   Not on file  Social History Narrative   Regular exercise: yes 3 times a week   Caffeine use: hot tea   Social Drivers of Corporate investment banker Strain: Low Risk  (09/15/2023)   Overall Financial Resource Strain (CARDIA)    Difficulty of Paying Living Expenses: Not hard at all  Food Insecurity: No Food Insecurity (10/29/2023)   Hunger Vital Sign    Worried About Running Out of Food in the Last Year: Never true    Ran Out of Food in the Last Year: Never true  Transportation Needs: No Transportation Needs (10/29/2023)   PRAPARE - Administrator, Civil Service (Medical): No    Lack of Transportation (Non-Medical): No  Physical Activity: Inactive (09/15/2023)   Exercise Vital Sign    Days of Exercise per Week: 0 days    Minutes of Exercise per Session: 0 min  Stress: No Stress Concern Present (09/15/2023)   [employer] of Occupational Health - Occupational Stress Questionnaire    Feeling of Stress : Not at all  Social Connections: Unknown (10/29/2023)   Social Connection and Isolation Panel    Frequency of Communication with Friends and Family: More than three times a week    Frequency of Social  Gatherings with Friends and Family: More than three times a week    Attends Religious Services: Never    Database administrator or Organizations: No    Attends Banker Meetings: Never    Marital Status: Not on file  Intimate Partner Violence: Not At Risk (10/29/2023)   Humiliation, Afraid, Rape, and Kick questionnaire    Fear of Current or Ex-Partner: No    Emotionally Abused: No    Physically Abused: No    Sexually Abused: No     Family History  Problem Relation Age of Onset   Diabetes Mellitus II Mother    Kidney disease Mother    Diabetes Mellitus II Father    CAD Father    Cancer Father        prostate   Kidney disease Father    Diabetes Mellitus II Brother    Kidney disease Brother    Diabetes Mellitus II Brother    Stomach cancer Brother 60   Kidney disease Brother    Colon cancer Neg Hx    Colon polyps Neg Hx    Esophageal cancer Neg Hx    Rectal cancer Neg Hx    Pancreatic cancer Neg Hx     ROS: [x]  Positive   [ ]  Negative   [ ]  All sytems reviewed and are negative  Cardiovascular: []  chest pain/pressure []  palpitations []  SOB lying flat []  DOE []  pain in legs while walking []  pain in legs at rest []  pain in legs at night []  non-healing ulcers []  hx of DVT []  swelling in legs  Pulmonary: []  productive cough []  asthma/wheezing []  home O2  Neurologic: []  weakness in []  arms []  legs []  numbness in []  arms []  legs []  hx of CVA []  mini stroke []   difficulty speaking or slurred speech []  temporary loss of vision in one eye []  dizziness  Hematologic: []  hx of cancer []  bleeding problems []  problems with blood clotting easily  Endocrine:   []  diabetes []  thyroid  disease  GI []  vomiting blood []  blood in stool  GU: []  CKD/renal failure []  HD--[]  M/W/F or []  T/T/S []  burning with urination []  blood in urine  Psychiatric: []  anxiety []  depression  Musculoskeletal: []  arthritis []  joint pain  Integumentary: []  rashes []   ulcers  Constitutional: []  fever []  chills   Physical Examination  Vitals:   11/07/23 0721  BP: (!) 140/60  Pulse: 70  Resp: 14  SpO2: 93%   There is no height or weight on file to calculate BMI.  General:  WDWN in NAD Gait: Not observed HENT: WNL, normocephalic Pulmonary: normal non-labored breathing Cardiac: regular, without  Murmurs, rubs or gallops Abdomen:  soft, NT/ND Vascular Exam/Pulses: Left basilic vein fistula with thrill Notable left arm edema Musculoskeletal: no muscle wasting or atrophy  Neurologic: A&O X 3; Appropriate Affect ; SENSATION: normal; MOTOR FUNCTION:  moving all extremities equally. Speech is fluent/normal   CBC    Component Value Date/Time   WBC 4.7 10/31/2023 0317   RBC 3.95 (L) 10/31/2023 0317   HGB 10.4 (L) 10/31/2023 0317   HGB 8.3 (L) 03/24/2020 1203   HCT 33.2 (L) 10/31/2023 0317   HCT 25.7 (L) 03/24/2020 1203   PLT 91 (L) 10/31/2023 0317   PLT 108 (L) 03/24/2020 1203   MCV 84.1 10/31/2023 0317   MCV 86 03/24/2020 1203   MCH 26.3 10/31/2023 0317   MCHC 31.3 10/31/2023 0317   RDW 18.6 (H) 10/31/2023 0317   RDW 15.7 (H) 03/24/2020 1203   LYMPHSABS 0.4 (L) 10/31/2023 0317   LYMPHSABS 0.5 (L) 03/24/2020 1203   MONOABS 0.6 10/31/2023 0317   EOSABS 0.2 10/31/2023 0317   EOSABS 0.1 03/24/2020 1203   BASOSABS 0.0 10/31/2023 0317   BASOSABS 0.0 03/24/2020 1203    BMET    Component Value Date/Time   NA 137 10/31/2023 0317   NA 139 10/31/2023 0317   NA 138 03/24/2020 1203   K 4.7 10/31/2023 0317   K 4.6 10/31/2023 0317   CL 99 10/31/2023 0317   CL 101 10/31/2023 0317   CO2 26 10/31/2023 0317   CO2 26 10/31/2023 0317   GLUCOSE 155 (H) 10/31/2023 0317   GLUCOSE 155 (H) 10/31/2023 0317   BUN 45 (H) 10/31/2023 0317   BUN 44 (H) 10/31/2023 0317   BUN 27 03/24/2020 1203   CREATININE 6.00 (H) 10/31/2023 0317   CREATININE 5.75 (H) 10/31/2023 0317   CREATININE 5.97 (H) 02/28/2020 1522   CALCIUM  8.0 (L) 10/31/2023 0317    CALCIUM  7.9 (L) 10/31/2023 0317   GFRNONAA 9 (L) 10/31/2023 0317   GFRNONAA 10 (L) 10/31/2023 0317   GFRAA 15 (L) 03/24/2020 1203    COAGS: Lab Results  Component Value Date   INR 1.4 (H) 05/04/2023   INR 1.5 (H) 10/11/2022   INR 1.6 (H) 09/09/2022     Non-Invasive Vascular Imaging:    Not applicable   ASSESSMENT/PLAN: This is a 73 y.o. male  with end-stage disease that presents for left arm fistulogram.  States his left arm is been swelling for the last several months.  Has a basilic vein fistula.  History of left subclavian vein stenosis.  Discussed plan for fistulogram with possible intervention including angioplasty and/or stent.  All questions answered  Lonni PARAS.  Gretta, MD [facility] Office: [phone]  Lonni JINNY Gretta

## 2023-11-10 ENCOUNTER — Encounter (HOSPITAL_COMMUNITY): Payer: Self-pay | Admitting: Vascular Surgery

## 2023-11-10 ENCOUNTER — Ambulatory Visit: Admitting: Physical Medicine and Rehabilitation

## 2023-11-16 ENCOUNTER — Other Ambulatory Visit: Payer: Self-pay | Admitting: Family Medicine

## 2023-11-16 DIAGNOSIS — E1142 Type 2 diabetes mellitus with diabetic polyneuropathy: Secondary | ICD-10-CM

## 2023-11-17 ENCOUNTER — Other Ambulatory Visit (HOSPITAL_COMMUNITY): Payer: Self-pay | Admitting: Nephrology

## 2023-11-17 DIAGNOSIS — R188 Other ascites: Secondary | ICD-10-CM

## 2023-11-19 ENCOUNTER — Ambulatory Visit (HOSPITAL_COMMUNITY)
Admission: RE | Admit: 2023-11-19 | Discharge: 2023-11-19 | Disposition: A | Discharge status: Home / Self Care | Source: Ambulatory Visit | Attending: Nephrology | Admitting: Nephrology

## 2023-11-19 DIAGNOSIS — R188 Other ascites: Secondary | ICD-10-CM | POA: Insufficient documentation

## 2023-11-19 HISTORY — PX: IR PARACENTESIS: IMG2679

## 2023-11-19 MED ORDER — LIDOCAINE HCL 1 % IJ SOLN
20.0000 mL | Freq: Once | INTRAMUSCULAR | Status: AC
  Administered 2023-11-19 (×2): 10 mL via INTRADERMAL

## 2023-11-19 MED ORDER — LIDOCAINE HCL 1 % IJ SOLN
INTRAMUSCULAR | Status: AC
  Filled 2023-11-19: qty 20

## 2023-12-03 ENCOUNTER — Ambulatory Visit (HOSPITAL_COMMUNITY)
Admission: RE | Admit: 2023-12-03 | Discharge: 2023-12-03 | Disposition: A | Discharge status: Home / Self Care | Source: Ambulatory Visit | Attending: Nephrology | Admitting: Nephrology

## 2023-12-03 DIAGNOSIS — R188 Other ascites: Secondary | ICD-10-CM | POA: Diagnosis present

## 2023-12-03 HISTORY — PX: IR PARACENTESIS: IMG2679

## 2023-12-03 MED ORDER — LIDOCAINE-EPINEPHRINE 1 %-1:100000 IJ SOLN
INTRAMUSCULAR | Status: AC
  Filled 2023-12-03: qty 1

## 2023-12-03 MED ORDER — LIDOCAINE-EPINEPHRINE 1 %-1:100000 IJ SOLN: 20.0000 mL | Freq: Once | INTRAMUSCULAR | Status: DC

## 2023-12-03 NOTE — Procedures (Signed)
 Ultrasound-guided d therapeutic paracentesis performed yielding 5.5 liters of straw colored fluid.  EBL is none.

## 2023-12-04 ENCOUNTER — Other Ambulatory Visit (HOSPITAL_COMMUNITY): Payer: Self-pay

## 2023-12-15 NOTE — Progress Notes (Signed)
 Subjective:    Patient ID: [name], male    DOB: 1950-05-18, 73 y.o.   MRN: [id number]  HPI Mr. Rittenberry is a 73 year old man who presents for f/u of BKA and cognitive deficits, daytime somnolence  1) Left BKA -he is starting rehab tues and thurs at Dr. Crist  -he is working with a Psychologist, educational on mon and wed to work on Primary school teacher  -he would like to keep to keep up with his physical conditioning -he is considering soft shell with an open back that will not put as much pressure on his shin.  -cannot try the new orthotic.  -he can get it in February  -has been ambulating with walker  2) Cognitive deficits -he is awake during the day.  -he asks about MRI results -he has been taking fish oil supplement -his wife notes that cognitive deficits have worsened  3) Neuropathy -he has pain in his right heel- it was present prior to his development of diabetes -it is burning -Qutenza  did not help -this is currently his worst symptom  4) Afib:  -not being treated for afib due to his comorbidities  5) Insomnia -intermittent Gets up at 7:30 -wakes at 2,3,4 -wants to put the leg on because then the day starts for him.  -he is taking 2 pills of lyrica  at night  6) Abdominal bloating  7) Diarrhea: -discussed that he is having 5-6 stools per day.   8) Right BKA -healing well  9) Fungal infection of left BKA -discussed that wife suspects ringworm  10) Insomnia: -sleeping at 1 and tries to sleep at 1 -sometimes he never falls asleep  11) abdominal fluid: -discussed that Dr. Ladona started him on torsemide  -he is needing thoracentesis every 2 weeks    Pain Inventory Average Pain 5 Pain Right Now 0 My pain is intermittent, constant, and stabbing  In the last 24 hours, has pain interfered with the following? General activity 0 Relation with others 0 Enjoyment of life 0 What TIME of day is your pain at its worst? morning , daytime, evening, and night Sleep (in  general) Good  Pain is worse with: walking, unsure, and some activites Pain improves with: rest and not wearing the prosthesis  Relief from Meds: not taking pain medication   Family History  Problem Relation Age of Onset   Diabetes Mellitus II Mother    Kidney disease Mother    Diabetes Mellitus II Father    CAD Father    Cancer Father        prostate   Kidney disease Father    Diabetes Mellitus II Brother    Kidney disease Brother    Diabetes Mellitus II Brother    Stomach cancer Brother 41   Kidney disease Brother    Colon cancer Neg Hx    Colon polyps Neg Hx    Esophageal cancer Neg Hx    Rectal cancer Neg Hx    Pancreatic cancer Neg Hx    Social History   Socioeconomic History   Marital status: Married    Spouse name: Not on file   Number of children: 2   Years of education: Not on file   Highest education level: Bachelor's degree (e.g., BA, AB, BS)  Occupational History   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  Tobacco Use   Smoking status: Former    Types: Cigars    Start date: 04/08/1973    Quit date: 12/24/1988    Years  since quitting: 35.0   Smokeless tobacco: Never   Tobacco comments:    Cigars and Pipe   Vaping Use   Vaping status: Never Used  Substance and Sexual Activity   Alcohol  use: Never   Drug use: Never   Sexual activity: Yes    Partners: Female  Other Topics Concern   Not on file  Social History Narrative   Regular exercise: yes 3 times a week   Caffeine use: hot tea   Social Drivers of Corporate investment banker Strain: Low Risk  (09/15/2023)   Overall Financial Resource Strain (CARDIA)    Difficulty of Paying Living Expenses: Not hard at all  Food Insecurity: No Food Insecurity (10/29/2023)   Hunger Vital Sign    Worried About Running Out of Food in the Last Year: Never true    Ran Out of Food in the Last Year: Never true  Transportation Needs: No Transportation Needs (10/29/2023)   PRAPARE - Administrator, Civil Service  (Medical): No    Lack of Transportation (Non-Medical): No  Physical Activity: Inactive (09/15/2023)   Exercise Vital Sign    Days of Exercise per Week: 0 days    Minutes of Exercise per Session: 0 min  Stress: No Stress Concern Present (09/15/2023)   [employer] of Occupational Health - Occupational Stress Questionnaire    Feeling of Stress : Not at all  Social Connections: Unknown (10/29/2023)   Social Connection and Isolation Panel    Frequency of Communication with Friends and Family: More than three times a week    Frequency of Social Gatherings with Friends and Family: More than three times a week    Attends Religious Services: Never    Database administrator or Organizations: No    Attends Banker Meetings: Never    Marital Status: Not on file   Past Surgical History:  Procedure Laterality Date   A-FLUTTER ABLATION N/A 04/06/2020   Procedure: A-FLUTTER ABLATION;  Surgeon: Waddell Danelle ORN, MD;  Location: [facility] INVASIVE CV LAB;  Service: Cardiovascular;  Laterality: N/A;   A/V FISTULAGRAM N/A 03/05/2023   Procedure: A/V Fistulagram;  Surgeon: Pearline [name] RAMAN, MD;  Location: [facility] INVASIVE CV LAB;  Service: Vascular;  Laterality: N/A;   A/V SHUNT INTERVENTION Left 11/07/2023   Procedure: A/V SHUNT INTERVENTION;  Surgeon: Gretta Lonni PARAS, MD;  Location: [facility] PV LAB;  Service: Cardiovascular;  Laterality: Left;   AMPUTATION Left 12/25/2013   Procedure: AMPUTATION RAY LEFT 5TH RAY;  Surgeon: Jerona Harden GAILS, MD;  Location: [facility] ORS;  Service: Orthopedics;  Laterality: Left;   AMPUTATION Right 12/15/2018   Procedure: AMPUTATION OF 4TH AND 5TH TOES RIGHT FOOT;  Surgeon: Harden Jerona GAILS, MD;  Location: [facility] OR;  Service: Orthopedics;  Laterality: Right;   AMPUTATION Left 02/02/2021   Procedure: LEFT FOOT 4TH RAY AMPUTATION;  Surgeon: Harden Jerona GAILS, MD;  Location: [facility] OR;  Service: Orthopedics;  Laterality: Left;   AMPUTATION Left 05/09/2021   Procedure: LEFT BELOW KNEE AMPUTATION;   Surgeon: Harden Jerona GAILS, MD;  Location: [facility] OR;  Service: Orthopedics;  Laterality: Left;   AMPUTATION Right 07/26/2022   Procedure: RIGHT BELOW KNEE AMPUTATION;  Surgeon: Harden Jerona GAILS, MD;  Location: [facility] OR;  Service: Orthopedics;  Laterality: Right;   APPLICATION OF WOUND VAC Right 12/15/2018   Procedure: APPLICATION OF WOUND VAC;  Surgeon: Harden Jerona GAILS, MD;  Location: [facility] OR;  Service: Orthopedics;  Laterality: Right;   AV FISTULA  PLACEMENT Left 03/25/2019   Procedure: LEFT ARM ARTERIOVENOUS (AV) FISTULA CREATION;  Surgeon: Serene Gaile ORN, MD;  Location: [facility] OR;  Service: Vascular;  Laterality: Left;   BACK SURGERY     BASCILIC VEIN TRANSPOSITION Left 05/20/2019   Procedure: SECOND STAGE LEFT BASCILIC VEIN TRANSPOSITION;  Surgeon: Serene Gaile ORN, MD;  Location: [facility] OR;  Service: Vascular;  Laterality: Left;   BIOPSY  06/22/2022   Procedure: BIOPSY;  Surgeon: Wilhelmenia Aloha Raddle., MD;  Location: [facility] ENDOSCOPY;  Service: Gastroenterology;;   CARDIAC CATHETERIZATION  02/17/2014   CATARACT EXTRACTION Bilateral    CHOLECYSTECTOMY     COLONOSCOPY  2011   [place], Normal   COLONOSCOPY N/A 06/23/2022   Procedure: COLONOSCOPY;  Surgeon: Legrand Victory LITTIE DOUGLAS, MD;  Location: [facility] ENDOSCOPY;  Service: Gastroenterology;  Laterality: N/A;   CORONARY ARTERY BYPASS GRAFT  2008   CYSTOSCOPY N/A 06/29/2020   Procedure: CYSTOSCOPY WITH CLOT EVACUATION AND  FULGERATION;  Surgeon: Matilda Senior, MD;  Location: [facility] OR;  Service: Urology;  Laterality: N/A;   CYSTOSCOPY WITH FULGERATION Bilateral 06/17/2020   Procedure: CYSTOSCOPY,BILATERAL RETROGRADE, CLOT EVACUATION WITH FULGERATION OF THE BLADDER;  Surgeon: Selma Donnice SAUNDERS, MD;  Location: [facility] OR;  Service: Urology;  Laterality: Bilateral;   CYSTOSCOPY WITH FULGERATION N/A 06/28/2020   Procedure: CYSTOSCOPY WITH CLOT EVACUATION AND FULGERATION OF BLEEDERS;  Surgeon: Matilda Senior, MD;  Location: [facility] OR;  Service: Urology;  Laterality: N/A;  1 HR    ENTEROSCOPY N/A 06/22/2022   Procedure: ENTEROSCOPY;  Surgeon: Wilhelmenia Aloha Raddle., MD;  Location: [facility] ENDOSCOPY;  Service: Gastroenterology;  Laterality: N/A;   HEMOSTASIS CLIP PLACEMENT  06/22/2022   Procedure: HEMOSTASIS CLIP PLACEMENT;  Surgeon: Wilhelmenia Aloha Raddle., MD;  Location: [facility] ENDOSCOPY;  Service: Gastroenterology;;   HOT HEMOSTASIS N/A 06/22/2022   Procedure: HOT HEMOSTASIS (ARGON PLASMA COAGULATION/BICAP);  Surgeon: Wilhelmenia Aloha Raddle., MD;  Location: [facility] ENDOSCOPY;  Service: Gastroenterology;  Laterality: N/A;   I & D EXTREMITY Right 12/15/2018   Procedure: DEBRIDEMENT RIGHT FOOT;  Surgeon: Harden Jerona GAILS, MD;  Location: [facility] OR;  Service: Orthopedics;  Laterality: Right;   I & D EXTREMITY Right 08/27/2019   Procedure: PARTIAL CUBOID EXCISION RIGHT FOOT;  Surgeon: Harden Jerona GAILS, MD;  Location: [facility] OR;  Service: Orthopedics;  Laterality: Right;   I & D EXTREMITY Right 01/07/2020   Procedure: RIGHT FOOT EXCISION INFECTED BONE;  Surgeon: Harden Jerona GAILS, MD;  Location: [facility] OR;  Service: Orthopedics;  Laterality: Right;   I & D EXTREMITY Right 05/17/2022   Procedure: IRRIGATION AND DEBRIDEMENT RIGHT FOOT ABSCESS;  Surgeon: Harden Jerona GAILS, MD;  Location: [facility] OR;  Service: Orthopedics;  Laterality: Right;   IR PARACENTESIS  10/11/2022   IR PARACENTESIS  12/24/2022   IR PARACENTESIS  02/06/2023   IR PARACENTESIS  05/05/2023   IR PARACENTESIS  05/19/2023   IR PARACENTESIS  07/11/2023   IR PARACENTESIS  07/30/2023   IR PARACENTESIS  08/20/2023   IR PARACENTESIS  09/19/2023   IR PARACENTESIS  10/03/2023   IR PARACENTESIS  10/17/2023   IR PARACENTESIS  10/30/2023   IR PARACENTESIS  11/19/2023   IR PARACENTESIS  12/03/2023   IR PARACENTESIS  12/16/2023   NECK SURGERY     novemver 2019   PERIPHERAL VASCULAR BALLOON ANGIOPLASTY Left 03/05/2023   Procedure: PERIPHERAL VASCULAR BALLOON ANGIOPLASTY;  Surgeon: Pearline [name] RAMAN, MD;  Location: [facility] INVASIVE CV LAB;  Service: Vascular;   Laterality: Left;  AVF   POLYPECTOMY  06/22/2022   Procedure: POLYPECTOMY;  Surgeon: Wilhelmenia Aloha Raddle., MD;  Location: [facility] ENDOSCOPY;  Service: Gastroenterology;;   POLYPECTOMY  06/23/2022   Procedure: POLYPECTOMY;  Surgeon: Legrand Victory LITTIE DOUGLAS, MD;  Location: [facility] ENDOSCOPY;  Service: Gastroenterology;;   STUMP REVISION Left 06/27/2021   Procedure: REVISION LEFT BELOW KNEE AMPUTATION;  Surgeon: Harden Jerona GAILS, MD;  Location: [facility] OR;  Service: Orthopedics;  Laterality: Left;   SUBMUCOSAL LIFTING INJECTION  06/22/2022   Procedure: SUBMUCOSAL LIFTING INJECTION;  Surgeon: Wilhelmenia Aloha Raddle., MD;  Location: [facility] ENDOSCOPY;  Service: Gastroenterology;;   SUBMUCOSAL TATTOO INJECTION  06/22/2022   Procedure: SUBMUCOSAL TATTOO INJECTION;  Surgeon: Wilhelmenia Aloha Raddle., MD;  Location: [facility] ENDOSCOPY;  Service: Gastroenterology;;   TRANSURETHRAL RESECTION OF PROSTATE N/A 06/28/2020   Procedure: TRANSURETHRAL RESECTION OF THE PROSTATE (TURP);  Surgeon: Matilda Senior, MD;  Location: [facility] OR;  Service: Urology;  Laterality: N/A;   VENOUS ANGIOPLASTY  11/07/2023   Procedure: VENOUS ANGIOPLASTY;  Surgeon: Gretta Lonni PARAS, MD;  Location: [facility] PV LAB;  Service: Cardiovascular;;  Basilic Vein, Subclavian Vein   WISDOM TOOTH EXTRACTION     Past Medical History:  Diagnosis Date   Acute osteomyelitis of metatarsal bone of left foot (HCC)    Ambulates with cane    straight cane   Anemia    Cervical myelopathy (HCC) 02/06/2018   Chronic kidney disease    dailysis M W F- home   Community acquired pneumonia of left lower lobe of lung 08/18/2021   Complication of anesthesia    Coronary artery disease    Dehiscence of amputation stump of left lower extremity (HCC)    Diabetes mellitus without complication (HCC)    type2   Diabetic foot ulcer (HCC)    Diabetic neuropathy (HCC) 02/06/2018   Dysrhythmia    Gait abnormality 08/24/2018   GERD (gastroesophageal reflux disease)     01/06/20- not current    Gout    History of blood transfusion    History of blood transfusion    History of kidney stones    passed stones   Hypercholesteremia    Hypertension    Hypothyroidism    Neuromuscular disorder (HCC)    neuropathy left leg and bilateral feet   Neuropathy    Partial nontraumatic amputation of foot, 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   PONV (postoperative nausea and vomiting)    Prostate cancer (HCC)    PVD (peripheral vascular disease) (HCC)    with amputations   BP (!) 131/57 (BP Location: Right Arm, Patient Position: Sitting, Cuff Size: Normal)   Pulse 85   Ht 5' 11 (1.803 m)   Wt 198 lb (89.8 kg)   SpO2 93%   BMI 27.62 kg/m   Opioid Risk Score:   Fall Risk Score:  `1  Depression screen [facility] 2/9     12/19/2023   11:22 AM 09/15/2023    1:18 PM 03/11/2022    1:32 PM 11/02/2021    3:29 PM 09/25/2021   11:00 AM 02/28/2020    3:07 PM 01/10/2020    4:21 PM  Depression screen PHQ 2/9  Decreased Interest 0 0 0 0 1 0 0  Down, Depressed, Hopeless 0 0 0 0 3 0 0  PHQ - 2 Score 0 0 0 0 4 0 0  Altered sleeping 2 3   3     Tired, decreased energy 2 3   3     Change in appetite 2 3   3     Feeling bad or  failure about yourself  0 0   0    Trouble concentrating 2 3   3     Moving slowly or fidgety/restless 0 0   3    Suicidal thoughts 0 0   0    PHQ-9 Score 8 12   19     Difficult doing work/chores Not difficult at all          Review of Systems  Constitutional:  Positive for appetite change.       Loss of taste  Musculoskeletal:  Positive for gait problem.  Neurological:  Positive for tremors and numbness.       Spasms  Psychiatric/Behavioral:         Depression  All other systems reviewed and are negative.      Objective:   Physical Exam Gen: no distress, normal appearing HEENT: oral mucosa pink and moist, NCAT Cardio: Reg rate Chest: normal effort, normal rate of breathing Abd: soft, non-distended Ext: no edema Psych: pleasant, normal affect Skin: intact Neuro: Alert and  oriented x3 Musculoskeletal: right foot twitches, missing digits on right foot, ambulating with RW     Assessment & Plan:  1) L BKA -encouraged appointment with [employer] for fitting of new prosthetic -continue using walker -discussed that he wants to go on a trip -continue PT -encouraged walking in his neighborhood  2) Cognitive deficits -discussed that recommended brand of fish oil was Rosita -continue modafinil  as he has experienced great improvements with this --discussed MRI results that show mild atrophy and small vessel changes -continue modafinil   -discussed that cognition is stable -discussed that he can have detailed conversations with his friends and   3) Fatigue/daytime somnolence -continue modafinil  200mg  daily -continue fish oil supplement -continue coffee -start ergocalciferol  50,000U once per week for 20 weeks.  -continue HD at night.  -encouraged lifting weights  -encouraged sunlight exposure   4) Afib -continue fish oil  5) Neuropathy -increase lyrica  to 150mg  QID -will not repeat Qutenza  since he had burning from this in the past.  Prescribing Home Zynex NexWave Stimulator Device and supplies as needed. IFC, NMES and TENS medically necessary Treatment Rx: Daily @ 30-40 minutes per treatment PRN. Zynex NexWave only, no substitutions. Treatment Goals: 1) To reduce and/or eliminate pain 2) To improve functional capacity and Activities of daily living 3) To reduce or prevent the need for oral medications 4) To improve circulation in the injured region 5) To decrease or prevent muscle spasm and muscle atrophy 6) To provide a self-management tool to the patient The patient has not sufficiently improved with conservative care. Numerous studies indexed by Medline and PubMed.gov have shown Neuromuscular, Interferential, and TENS stimulators to reduce pain, improve function, and reduce medication use in injured patients. Continued use of this evidence based, safe, drug free  treatment is both reasonable and medically necessary at this time.   6) R BKA -discussed his therapy -discussed goal for 2nd prosthetic -recommended offloading to prevent pressure injury -discussed that he has a nurse at home  7) L BKA ringworm: -trial antifungal   8) Insomnia: -discussed that coreg  can decrease melatonin production  9) Frequent stools: -recommended coconut cult  10) Impaired cognition: -discussed that this appears improved -discussed that he seems more alert  11) Afib: -continue coreg   12) Tremor: -discussed propanolol as an alternative to Coreg  if cardiology is ok with that

## 2023-12-16 ENCOUNTER — Ambulatory Visit (HOSPITAL_COMMUNITY)
Admission: RE | Admit: 2023-12-16 | Discharge: 2023-12-16 | Disposition: A | Discharge status: Home / Self Care | Source: Ambulatory Visit | Attending: Nephrology | Admitting: Nephrology

## 2023-12-16 DIAGNOSIS — R188 Other ascites: Secondary | ICD-10-CM

## 2023-12-16 HISTORY — PX: IR PARACENTESIS: IMG2679

## 2023-12-16 MED ORDER — LIDOCAINE-EPINEPHRINE 1 %-1:100000 IJ SOLN
INTRAMUSCULAR | Status: AC
  Filled 2023-12-16: qty 1

## 2023-12-16 NOTE — Procedures (Signed)
 PROCEDURE SUMMARY:  Successful US  guided paracentesis from left lower quadrant.  Yielded 7.1 L of clear yellow fluid.  No immediate complications.  Pt tolerated well.   Specimen not sent for labs.  EBL < 2 mL  Warren JONELLE Dais, NP 12/16/2023 3:07 PM

## 2023-12-19 ENCOUNTER — Telehealth: Payer: Self-pay

## 2023-12-19 ENCOUNTER — Ambulatory Visit: Payer: Self-pay | Admitting: Family Medicine

## 2023-12-19 ENCOUNTER — Encounter: Payer: Self-pay | Admitting: Family Medicine

## 2023-12-19 ENCOUNTER — Ambulatory Visit: Admitting: Family Medicine

## 2023-12-19 VITALS — BP 124/68 | HR 45 | Temp 97.3°F | Ht 71.0 in | Wt 198.0 lb

## 2023-12-19 DIAGNOSIS — E1169 Type 2 diabetes mellitus with other specified complication: Secondary | ICD-10-CM

## 2023-12-19 DIAGNOSIS — Z23 Encounter for immunization: Secondary | ICD-10-CM | POA: Diagnosis not present

## 2023-12-19 DIAGNOSIS — E785 Hyperlipidemia, unspecified: Secondary | ICD-10-CM

## 2023-12-19 DIAGNOSIS — E1142 Type 2 diabetes mellitus with diabetic polyneuropathy: Secondary | ICD-10-CM | POA: Diagnosis not present

## 2023-12-19 DIAGNOSIS — K219 Gastro-esophageal reflux disease without esophagitis: Secondary | ICD-10-CM

## 2023-12-19 DIAGNOSIS — G629 Polyneuropathy, unspecified: Secondary | ICD-10-CM

## 2023-12-19 LAB — LIPID PANEL
Cholesterol: 140 mg/dL (ref 0–200)
HDL: 32.2 mg/dL — ABNORMAL LOW (ref >39.00)
LDL Cholesterol: 84 mg/dL (ref 0–99)
NonHDL: 107.73
Total CHOL/HDL Ratio: 4
Triglycerides: 117 mg/dL (ref 0.0–149.0)
VLDL: 23.4 mg/dL (ref 0.0–40.0)

## 2023-12-19 LAB — COMPREHENSIVE METABOLIC PANEL WITH GFR
ALT: 10 U/L (ref 0–53)
AST: 19 U/L (ref 0–37)
Albumin: 2.9 g/dL — ABNORMAL LOW (ref 3.5–5.2)
Alkaline Phosphatase: 118 U/L — ABNORMAL HIGH (ref 39–117)
BUN: 43 mg/dL — ABNORMAL HIGH (ref 6–23)
CO2: 29 meq/L (ref 19–32)
Calcium: 7.7 mg/dL — ABNORMAL LOW (ref 8.4–10.5)
Chloride: 95 meq/L — ABNORMAL LOW (ref 96–112)
Creatinine, Ser: 5.2 mg/dL (ref 0.40–1.50)
GFR: 10.26 mL/min — CL (ref >60.00)
Glucose, Bld: 78 mg/dL (ref 70–99)
Potassium: 5.6 meq/L — ABNORMAL HIGH (ref 3.5–5.1)
Sodium: 134 meq/L — ABNORMAL LOW (ref 135–145)
Total Bilirubin: 0.5 mg/dL (ref 0.2–1.2)
Total Protein: 4.7 g/dL — ABNORMAL LOW (ref 6.0–8.3)

## 2023-12-19 MED ORDER — COVID-19 MRNA VACC (MODERNA) 50 MCG/0.5ML IM SUSY: 0.5000 mL | PREFILLED_SYRINGE | Freq: Once | INTRAMUSCULAR | 0 refills | Status: DC

## 2023-12-19 MED ORDER — PANTOPRAZOLE SODIUM 40 MG PO TBEC: 40.0000 mg | DELAYED_RELEASE_TABLET | Freq: Every day | ORAL | 2 refills | Status: AC

## 2023-12-19 MED ORDER — ROSUVASTATIN CALCIUM 10 MG PO TABS: 10.0000 mg | ORAL_TABLET | Freq: Every day | ORAL | 1 refills | Status: AC

## 2023-12-19 MED ORDER — PREGABALIN 150 MG PO CAPS: 150.0000 mg | ORAL_CAPSULE | Freq: Three times a day (TID) | ORAL | 0 refills | Status: DC

## 2023-12-19 MED ORDER — COVID-19 MRNA VACC (MODERNA) 50 MCG/0.5ML IM SUSP: 0.5000 mL | Freq: Once | INTRAMUSCULAR | 0 refills | Status: AC

## 2023-12-19 NOTE — Telephone Encounter (Signed)
 CRITICAL VALUE STICKER  CRITICAL VALUE: Creatinine 5.20, GFR 10.26  RECEIVER (on-site recipient of call): Collie Hind, BSN, RN  DATE & TIME NOTIFIED: 12/19/2023 1430  MESSENGER (representative from lab): Saa  MD NOTIFIED: JoAnna Williamson  TIME OF NOTIFICATION: 12/19/2023 1431  RESPONSE: Aware

## 2023-12-19 NOTE — Progress Notes (Signed)
 Established Patient Office Visit   Subjective:  Patient ID: [name], male    DOB: 02-19-1951  Age: 73 y.o. MRN: [id number]  Chief Complaint  Patient presents with   Medical Management of Chronic Issues    3 month follow up     HPI Gout: Chronic. Patient is prescribed Allopurinol  200mg  tablet, take 2 tablets daily. Not had a gout flare up in a while.    GERD: Chronic. Patient is prescribed Protonix  40mg  daily. Effective.    Neuropathy: Chronic. Patient is prescribed Pregabalin  150mg  tablet, 3 times a day. Takes medication as prescribed. He takes one tablet in the AM and 2 in the PM. He reports it helps a lot, but does not completely controlled. At previous appointment, he reports he will sometimes take 4 tablets a day instead of 3 as prescribed. At last appointment, requested a referral to pain management but does not want pain management at this time. Last time he had medication was this morning.   Hyperlipidemia: Chronic. Patient is prescribed Rosuvastatin  10mg  daily. Effective. Last lipids drawn with [facility] on 05/13/2023.  ROS See HPI above     Objective:   BP 124/68   Pulse (!) 45   Temp (!) 97.3 F (36.3 C) (Oral)   Ht 5' 11 (1.803 m)   Wt 198 lb (89.8 kg)   SpO2 97%   BMI 27.62 kg/m    Physical Exam Vitals reviewed.  Constitutional:      General: He is not in acute distress.    Appearance: Normal appearance. He is not ill-appearing, toxic-appearing or diaphoretic.  HENT:     Head: Normocephalic and atraumatic.  Eyes:     General:        Right eye: No discharge.        Left eye: No discharge.     Conjunctiva/sclera: Conjunctivae normal.  Cardiovascular:     Rate and Rhythm: Normal rate and regular rhythm.     Heart sounds: Normal heart sounds. No murmur heard.    No friction rub. No gallop.  Pulmonary:     Effort: Pulmonary effort is normal. No respiratory distress.     Breath sounds: Normal breath sounds.  Musculoskeletal:        General:  Normal range of motion.     Right Lower Extremity: Right leg is amputated below knee.     Left Lower Extremity: Left leg is amputated below knee.  Skin:    General: Skin is warm and dry.  Neurological:     General: No focal deficit present.     Mental Status: He is alert and oriented to person, place, and time. Mental status is at baseline.     Gait: Gait abnormal.  Psychiatric:        Mood and Affect: Mood normal.        Behavior: Behavior normal.        Thought Content: Thought content normal.        Judgment: Judgment normal.    Results for orders placed or performed in visit on 12/19/23  Comprehensive metabolic panel with GFR  Result Value Ref Range   Sodium 134 (L) 135 - 145 mEq/L   Potassium 5.6 (H) 3.5 - 5.1 mEq/L   Chloride 95 (L) 96 - 112 mEq/L   CO2 29 19 - 32 mEq/L   Glucose, Bld 78 70 - 99 mg/dL   BUN 43 (H) 6 - 23 mg/dL   Creatinine, Ser 4.79 (HH) 0.40 -  1.50 mg/dL   Total Bilirubin 0.5 0.2 - 1.2 mg/dL   Alkaline Phosphatase 118 (H) 39 - 117 U/L   AST 19 0 - 37 U/L   ALT 10 0 - 53 U/L   Total Protein 4.7 (L) 6.0 - 8.3 g/dL   Albumin  2.9 (L) 3.5 - 5.2 g/dL   GFR 89.73 (LL) >39.99 mL/min   Calcium  7.7 (L) 8.4 - 10.5 mg/dL  Lipid panel  Result Value Ref Range   Cholesterol 140 0 - 200 mg/dL   Triglycerides 882.9 0.0 - 149.0 mg/dL   HDL 67.79 (L) >60.99 mg/dL   VLDL 76.5 0.0 - 59.9 mg/dL   LDL Cholesterol 84 0 - 99 mg/dL   Total CHOL/HDL Ratio 4    NonHDL 107.73     The 10-year ASCVD risk score (Arnett DK, et al., 2019) is: 44.2%    Assessment & Plan:  Hyperlipidemia associated with type 2 diabetes mellitus (HCC) Assessment & Plan: Stable. Continue with Rosuvastatin  10mg  daily. Refilled medication. Ordered CMP to assess liver function and lipid panel.   Orders: -     Comprehensive metabolic panel with GFR -     Lipid panel -     Rosuvastatin  Calcium ; Take 1 tablet (10 mg total) by mouth daily.  Dispense: 90 tablet; Refill: 1  Diabetic polyneuropathy  associated with type 2 diabetes mellitus (HCC) -     Drug Screen 10 W/Conf, Serum -     Pregabalin ; Take 1 capsule (150 mg total) by mouth in the morning, at noon, and at bedtime.  Dispense: 90 capsule; Refill: 0 -     COVID-19 mRNA Vacc (Moderna); Inject 0.5 mLs into the muscle once for 1 dose.  Dispense: 0.5 mL; Refill: 0  Gastroesophageal reflux disease without esophagitis Assessment & Plan: Stable. Continue and refilled Protonix  40mg  daily.   Orders: -     Pantoprazole  Sodium; Take 1 tablet (40 mg total) by mouth daily.  Dispense: 90 tablet; Refill: 2  Immunization due -     Flu vaccine HIGH DOSE PF(Fluzone Trivalent) -     COVID-19 mRNA Vacc (Moderna); Inject 0.5 mLs into the muscle once for 1 dose.  Dispense: 0.5 mL; Refill: 0  Neuropathy (HCC) Assessment & Plan: Stable. Continue Pregabalin  150mg  TID, but takes it as 1 tablet in the AM and 2 in the PM. Controlled substance contract is UTD. PDMP reviewed. Last refill was on 08/11. Refilled medication. Ordered drug screen through serum.    -Influenza vaccine provided.  -Sent prescription for covid to pharmacy.  Return in about 3 months (around 03/19/2024) for chronic management.   JoAnna Williamson, NP

## 2023-12-22 ENCOUNTER — Encounter: Payer: Self-pay | Admitting: Physical Medicine and Rehabilitation

## 2023-12-22 ENCOUNTER — Encounter: Attending: Physical Medicine and Rehabilitation | Admitting: Physical Medicine and Rehabilitation

## 2023-12-22 VITALS — BP 131/57 | HR 85 | Ht 71.0 in | Wt 198.0 lb

## 2023-12-22 DIAGNOSIS — I480 Paroxysmal atrial fibrillation: Secondary | ICD-10-CM | POA: Diagnosis not present

## 2023-12-22 DIAGNOSIS — K219 Gastro-esophageal reflux disease without esophagitis: Secondary | ICD-10-CM | POA: Insufficient documentation

## 2023-12-22 DIAGNOSIS — Z794 Long term (current) use of insulin: Secondary | ICD-10-CM

## 2023-12-22 DIAGNOSIS — R188 Other ascites: Secondary | ICD-10-CM | POA: Insufficient documentation

## 2023-12-22 DIAGNOSIS — E1142 Type 2 diabetes mellitus with diabetic polyneuropathy: Secondary | ICD-10-CM | POA: Diagnosis not present

## 2023-12-22 DIAGNOSIS — S88112S Complete traumatic amputation at level between knee and ankle, left lower leg, sequela: Secondary | ICD-10-CM | POA: Insufficient documentation

## 2023-12-22 MED ORDER — PREGABALIN 150 MG PO CAPS: 150.0000 mg | ORAL_CAPSULE | Freq: Four times a day (QID) | ORAL | 3 refills | Status: DC

## 2023-12-22 MED ORDER — MODAFINIL 200 MG PO TABS: 200.0000 mg | ORAL_TABLET | Freq: Every day | ORAL | 0 refills | Status: DC

## 2023-12-22 NOTE — Assessment & Plan Note (Signed)
 Stable. Continue with Rosuvastatin  10mg  daily. Refilled medication. Ordered CMP to assess liver function and lipid panel.

## 2023-12-22 NOTE — Assessment & Plan Note (Signed)
 Stable. Continue Pregabalin  150mg  TID, but takes it as 1 tablet in the AM and 2 in the PM. Controlled substance contract is UTD. PDMP reviewed. Last refill was on 08/11. Refilled medication. Ordered drug screen through serum.

## 2023-12-22 NOTE — Assessment & Plan Note (Signed)
 Stable. Continue and refilled Protonix  40mg  daily.

## 2023-12-22 NOTE — Patient Instructions (Addendum)
-  Ordered labs (CMP, lipid, serum drug screen) based on cholesterol and management of Lyrica .  -Refilled Rosuvastatin , Pregabalin , and Pantoprazole . Continue all other medications.  -Sent prescription for covid vaccine to pharmacy and influenza vaccine provided.  -Refilled Pantoprazole  for gastric reflux.  -Follow up in 3 months for chronic management.

## 2023-12-23 NOTE — Addendum Note (Signed)
 Encounter addended by: Angelica Frandsen H on: 12/23/2023 10:01 AM  Actions taken: Charge Capture section accepted

## 2023-12-24 ENCOUNTER — Other Ambulatory Visit: Payer: Self-pay

## 2023-12-24 DIAGNOSIS — E1142 Type 2 diabetes mellitus with diabetic polyneuropathy: Secondary | ICD-10-CM

## 2023-12-30 ENCOUNTER — Ambulatory Visit (HOSPITAL_COMMUNITY)
Admission: RE | Admit: 2023-12-30 | Discharge: 2023-12-30 | Disposition: A | Discharge status: Home / Self Care | Source: Ambulatory Visit | Attending: Nephrology | Admitting: Nephrology

## 2023-12-30 DIAGNOSIS — R188 Other ascites: Secondary | ICD-10-CM | POA: Diagnosis present

## 2023-12-30 DIAGNOSIS — Z538 Procedure and treatment not carried out for other reasons: Secondary | ICD-10-CM | POA: Insufficient documentation

## 2023-12-30 HISTORY — PX: IR PARACENTESIS: IMG2679

## 2023-12-30 MED ORDER — LIDOCAINE HCL 1 % IJ SOLN: 20.0000 mL | Freq: Once | INTRAMUSCULAR | Status: DC

## 2023-12-30 MED ORDER — LIDOCAINE HCL 1 % IJ SOLN
INTRAMUSCULAR | Status: AC
  Filled 2023-12-30: qty 20

## 2023-12-30 NOTE — Addendum Note (Signed)
 Encounter addended by: Loic Hobin H on: 12/30/2023 7:56 AM  Actions taken: Imaging Exam ended, Charge Capture section accepted

## 2024-01-07 ENCOUNTER — Other Ambulatory Visit (HOSPITAL_COMMUNITY): Payer: Self-pay | Admitting: Nephrology

## 2024-01-07 DIAGNOSIS — R188 Other ascites: Secondary | ICD-10-CM

## 2024-01-08 ENCOUNTER — Emergency Department (HOSPITAL_COMMUNITY)

## 2024-01-08 ENCOUNTER — Inpatient Hospital Stay (HOSPITAL_COMMUNITY)
Admission: EM | Admit: 2024-01-08 | Discharge: 2024-01-15 | DRG: 871 | Disposition: A | Discharge status: Skilled Nursing Facility | Attending: Internal Medicine | Admitting: Internal Medicine

## 2024-01-08 ENCOUNTER — Inpatient Hospital Stay (HOSPITAL_COMMUNITY)

## 2024-01-08 ENCOUNTER — Encounter (HOSPITAL_COMMUNITY): Payer: Self-pay

## 2024-01-08 ENCOUNTER — Other Ambulatory Visit: Payer: Self-pay

## 2024-01-08 DIAGNOSIS — Z7985 Long-term (current) use of injectable non-insulin antidiabetic drugs: Secondary | ICD-10-CM

## 2024-01-08 DIAGNOSIS — E1151 Type 2 diabetes mellitus with diabetic peripheral angiopathy without gangrene: Secondary | ICD-10-CM | POA: Diagnosis present

## 2024-01-08 DIAGNOSIS — D631 Anemia in chronic kidney disease: Secondary | ICD-10-CM | POA: Diagnosis present

## 2024-01-08 DIAGNOSIS — Z9079 Acquired absence of other genital organ(s): Secondary | ICD-10-CM

## 2024-01-08 DIAGNOSIS — N186 End stage renal disease: Secondary | ICD-10-CM | POA: Diagnosis present

## 2024-01-08 DIAGNOSIS — Z833 Family history of diabetes mellitus: Secondary | ICD-10-CM

## 2024-01-08 DIAGNOSIS — A4101 Sepsis due to Methicillin susceptible Staphylococcus aureus: Secondary | ICD-10-CM | POA: Diagnosis not present

## 2024-01-08 DIAGNOSIS — K7581 Nonalcoholic steatohepatitis (NASH): Secondary | ICD-10-CM

## 2024-01-08 DIAGNOSIS — R6520 Severe sepsis without septic shock: Secondary | ICD-10-CM | POA: Diagnosis present

## 2024-01-08 DIAGNOSIS — I5030 Unspecified diastolic (congestive) heart failure: Secondary | ICD-10-CM

## 2024-01-08 DIAGNOSIS — D696 Thrombocytopenia, unspecified: Secondary | ICD-10-CM | POA: Diagnosis present

## 2024-01-08 DIAGNOSIS — I4891 Unspecified atrial fibrillation: Secondary | ICD-10-CM

## 2024-01-08 DIAGNOSIS — Y835 Amputation of limb(s) as the cause of abnormal reaction of the patient, or of later complication, without mention of misadventure at the time of the procedure: Secondary | ICD-10-CM | POA: Diagnosis present

## 2024-01-08 DIAGNOSIS — T8140XA Infection following a procedure, unspecified, initial encounter: Secondary | ICD-10-CM | POA: Diagnosis present

## 2024-01-08 DIAGNOSIS — I10 Essential (primary) hypertension: Secondary | ICD-10-CM | POA: Diagnosis present

## 2024-01-08 DIAGNOSIS — I4821 Permanent atrial fibrillation: Secondary | ICD-10-CM | POA: Diagnosis present

## 2024-01-08 DIAGNOSIS — I959 Hypotension, unspecified: Secondary | ICD-10-CM | POA: Diagnosis present

## 2024-01-08 DIAGNOSIS — Z87442 Personal history of urinary calculi: Secondary | ICD-10-CM

## 2024-01-08 DIAGNOSIS — Z951 Presence of aortocoronary bypass graft: Secondary | ICD-10-CM

## 2024-01-08 DIAGNOSIS — I272 Pulmonary hypertension, unspecified: Secondary | ICD-10-CM | POA: Diagnosis present

## 2024-01-08 DIAGNOSIS — Z1152 Encounter for screening for COVID-19: Secondary | ICD-10-CM

## 2024-01-08 DIAGNOSIS — Z89512 Acquired absence of left leg below knee: Secondary | ICD-10-CM | POA: Diagnosis not present

## 2024-01-08 DIAGNOSIS — Z992 Dependence on renal dialysis: Secondary | ICD-10-CM

## 2024-01-08 DIAGNOSIS — K7460 Unspecified cirrhosis of liver: Secondary | ICD-10-CM | POA: Diagnosis present

## 2024-01-08 DIAGNOSIS — Z87891 Personal history of nicotine dependence: Secondary | ICD-10-CM

## 2024-01-08 DIAGNOSIS — R34 Anuria and oliguria: Secondary | ICD-10-CM | POA: Diagnosis present

## 2024-01-08 DIAGNOSIS — I132 Hypertensive heart and chronic kidney disease with heart failure and with stage 5 chronic kidney disease, or end stage renal disease: Secondary | ICD-10-CM | POA: Diagnosis present

## 2024-01-08 DIAGNOSIS — L03115 Cellulitis of right lower limb: Secondary | ICD-10-CM | POA: Diagnosis present

## 2024-01-08 DIAGNOSIS — Z89511 Acquired absence of right leg below knee: Secondary | ICD-10-CM

## 2024-01-08 DIAGNOSIS — Z8546 Personal history of malignant neoplasm of prostate: Secondary | ICD-10-CM

## 2024-01-08 DIAGNOSIS — Z885 Allergy status to narcotic agent status: Secondary | ICD-10-CM

## 2024-01-08 DIAGNOSIS — N2581 Secondary hyperparathyroidism of renal origin: Secondary | ICD-10-CM | POA: Diagnosis present

## 2024-01-08 DIAGNOSIS — I081 Rheumatic disorders of both mitral and tricuspid valves: Secondary | ICD-10-CM | POA: Diagnosis present

## 2024-01-08 DIAGNOSIS — B9561 Methicillin susceptible Staphylococcus aureus infection as the cause of diseases classified elsewhere: Secondary | ICD-10-CM | POA: Diagnosis present

## 2024-01-08 DIAGNOSIS — Z794 Long term (current) use of insulin: Secondary | ICD-10-CM

## 2024-01-08 DIAGNOSIS — M25561 Pain in right knee: Secondary | ICD-10-CM | POA: Diagnosis not present

## 2024-01-08 DIAGNOSIS — G9340 Encephalopathy, unspecified: Secondary | ICD-10-CM | POA: Diagnosis not present

## 2024-01-08 DIAGNOSIS — E1142 Type 2 diabetes mellitus with diabetic polyneuropathy: Secondary | ICD-10-CM | POA: Diagnosis present

## 2024-01-08 DIAGNOSIS — Z8419 Family history of other disorders of kidney and ureter: Secondary | ICD-10-CM

## 2024-01-08 DIAGNOSIS — Z79890 Hormone replacement therapy (postmenopausal): Secondary | ICD-10-CM

## 2024-01-08 DIAGNOSIS — Z9109 Other allergy status, other than to drugs and biological substances: Secondary | ICD-10-CM

## 2024-01-08 DIAGNOSIS — Z800 Family history of malignant neoplasm of digestive organs: Secondary | ICD-10-CM

## 2024-01-08 DIAGNOSIS — R509 Fever, unspecified: Principal | ICD-10-CM

## 2024-01-08 DIAGNOSIS — R188 Other ascites: Secondary | ICD-10-CM | POA: Diagnosis present

## 2024-01-08 DIAGNOSIS — E1122 Type 2 diabetes mellitus with diabetic chronic kidney disease: Secondary | ICD-10-CM | POA: Diagnosis present

## 2024-01-08 DIAGNOSIS — E119 Type 2 diabetes mellitus without complications: Secondary | ICD-10-CM

## 2024-01-08 DIAGNOSIS — L8990 Pressure ulcer of unspecified site, unspecified stage: Secondary | ICD-10-CM | POA: Diagnosis present

## 2024-01-08 DIAGNOSIS — Z8249 Family history of ischemic heart disease and other diseases of the circulatory system: Secondary | ICD-10-CM

## 2024-01-08 DIAGNOSIS — D500 Iron deficiency anemia secondary to blood loss (chronic): Secondary | ICD-10-CM | POA: Diagnosis present

## 2024-01-08 DIAGNOSIS — Z515 Encounter for palliative care: Secondary | ICD-10-CM

## 2024-01-08 DIAGNOSIS — G928 Other toxic encephalopathy: Secondary | ICD-10-CM | POA: Diagnosis present

## 2024-01-08 DIAGNOSIS — E039 Hypothyroidism, unspecified: Secondary | ICD-10-CM | POA: Diagnosis present

## 2024-01-08 DIAGNOSIS — Z789 Other specified health status: Secondary | ICD-10-CM | POA: Diagnosis not present

## 2024-01-08 DIAGNOSIS — R6510 Systemic inflammatory response syndrome (SIRS) of non-infectious origin without acute organ dysfunction: Secondary | ICD-10-CM | POA: Diagnosis present

## 2024-01-08 DIAGNOSIS — M71161 Other infective bursitis, right knee: Secondary | ICD-10-CM | POA: Diagnosis present

## 2024-01-08 DIAGNOSIS — K219 Gastro-esophageal reflux disease without esophagitis: Secondary | ICD-10-CM | POA: Diagnosis present

## 2024-01-08 DIAGNOSIS — D638 Anemia in other chronic diseases classified elsewhere: Secondary | ICD-10-CM | POA: Diagnosis present

## 2024-01-08 DIAGNOSIS — E7800 Pure hypercholesterolemia, unspecified: Secondary | ICD-10-CM | POA: Diagnosis present

## 2024-01-08 DIAGNOSIS — D6959 Other secondary thrombocytopenia: Secondary | ICD-10-CM | POA: Diagnosis present

## 2024-01-08 DIAGNOSIS — I2510 Atherosclerotic heart disease of native coronary artery without angina pectoris: Secondary | ICD-10-CM | POA: Diagnosis not present

## 2024-01-08 DIAGNOSIS — I5032 Chronic diastolic (congestive) heart failure: Secondary | ICD-10-CM | POA: Diagnosis present

## 2024-01-08 DIAGNOSIS — R140 Abdominal distension (gaseous): Secondary | ICD-10-CM | POA: Diagnosis present

## 2024-01-08 DIAGNOSIS — M109 Gout, unspecified: Secondary | ICD-10-CM | POA: Diagnosis present

## 2024-01-08 DIAGNOSIS — Z7189 Other specified counseling: Secondary | ICD-10-CM | POA: Diagnosis not present

## 2024-01-08 DIAGNOSIS — R4189 Other symptoms and signs involving cognitive functions and awareness: Secondary | ICD-10-CM | POA: Diagnosis present

## 2024-01-08 DIAGNOSIS — Z79899 Other long term (current) drug therapy: Secondary | ICD-10-CM

## 2024-01-08 LAB — CBC WITH DIFFERENTIAL/PLATELET
Abs Immature Granulocytes: 0.14 K/uL — ABNORMAL HIGH (ref 0.00–0.07)
Abs Immature Granulocytes: 0.27 K/uL — ABNORMAL HIGH (ref 0.00–0.07)
Basophils Absolute: 0.1 K/uL (ref 0.0–0.1)
Basophils Absolute: 0.1 K/uL (ref 0.0–0.1)
Basophils Relative: 1 %
Basophils Relative: 1 %
Eosinophils Absolute: 0 K/uL (ref 0.0–0.5)
Eosinophils Absolute: 0.1 K/uL (ref 0.0–0.5)
Eosinophils Relative: 0 %
Eosinophils Relative: 1 %
HCT: 34.2 % — ABNORMAL LOW (ref 39.0–52.0)
HCT: 38 % — ABNORMAL LOW (ref 39.0–52.0)
Hemoglobin: 10.5 g/dL — ABNORMAL LOW (ref 13.0–17.0)
Hemoglobin: 12 g/dL — ABNORMAL LOW (ref 13.0–17.0)
Immature Granulocytes: 1 %
Immature Granulocytes: 2 %
Lymphocytes Relative: 2 %
Lymphocytes Relative: 2 %
Lymphs Abs: 0.3 K/uL — ABNORMAL LOW (ref 0.7–4.0)
Lymphs Abs: 0.3 K/uL — ABNORMAL LOW (ref 0.7–4.0)
MCH: 28 pg (ref 26.0–34.0)
MCH: 28.1 pg (ref 26.0–34.0)
MCHC: 30.7 g/dL (ref 30.0–36.0)
MCHC: 31.6 g/dL (ref 30.0–36.0)
MCV: 88.8 fL (ref 80.0–100.0)
MCV: 91.4 fL (ref 80.0–100.0)
Monocytes Absolute: 0.4 K/uL (ref 0.1–1.0)
Monocytes Absolute: 1 K/uL (ref 0.1–1.0)
Monocytes Relative: 3 %
Monocytes Relative: 7 %
Neutro Abs: 10.6 K/uL — ABNORMAL HIGH (ref 1.7–7.7)
Neutro Abs: 13.5 K/uL — ABNORMAL HIGH (ref 1.7–7.7)
Neutrophils Relative %: 89 %
Neutrophils Relative %: 91 %
Platelets: 141 K/uL — ABNORMAL LOW (ref 150–400)
Platelets: 148 K/uL — ABNORMAL LOW (ref 150–400)
RBC: 3.74 MIL/uL — ABNORMAL LOW (ref 4.22–5.81)
RBC: 4.28 MIL/uL (ref 4.22–5.81)
RDW: 15.3 % (ref 11.5–15.5)
RDW: 15.4 % (ref 11.5–15.5)
Smear Review: NORMAL
WBC: 11.6 K/uL — ABNORMAL HIGH (ref 4.0–10.5)
WBC: 15.1 K/uL — ABNORMAL HIGH (ref 4.0–10.5)
nRBC: 0 % (ref 0.0–0.2)
nRBC: 0 % (ref 0.0–0.2)

## 2024-01-08 LAB — GLUCOSE, CAPILLARY
Glucose-Capillary: 107 mg/dL — ABNORMAL HIGH (ref 70–99)
Glucose-Capillary: 62 mg/dL — ABNORMAL LOW (ref 70–99)
Glucose-Capillary: 78 mg/dL (ref 70–99)
Glucose-Capillary: 79 mg/dL (ref 70–99)

## 2024-01-08 LAB — COMPREHENSIVE METABOLIC PANEL WITH GFR
ALT: 12 U/L (ref 0–44)
AST: 22 U/L (ref 15–41)
Albumin: 2.3 g/dL — ABNORMAL LOW (ref 3.5–5.0)
Alkaline Phosphatase: 165 U/L — ABNORMAL HIGH (ref 38–126)
Anion gap: 15 (ref 5–15)
BUN: 58 mg/dL — ABNORMAL HIGH (ref 8–23)
CO2: 22 mmol/L (ref 22–32)
Calcium: 7.3 mg/dL — ABNORMAL LOW (ref 8.9–10.3)
Chloride: 99 mmol/L (ref 98–111)
Creatinine, Ser: 6.73 mg/dL — ABNORMAL HIGH (ref 0.61–1.24)
GFR, Estimated: 8 mL/min — ABNORMAL LOW (ref >60)
Glucose, Bld: 76 mg/dL (ref 70–99)
Potassium: 5.7 mmol/L — ABNORMAL HIGH (ref 3.5–5.1)
Sodium: 136 mmol/L (ref 135–145)
Total Bilirubin: 1 mg/dL (ref 0.0–1.2)
Total Protein: 4.9 g/dL — ABNORMAL LOW (ref 6.5–8.1)

## 2024-01-08 LAB — PROTIME-INR
INR: 1.1 (ref 0.8–1.2)
Prothrombin Time: 15 s (ref 11.4–15.2)

## 2024-01-08 LAB — SYNOVIAL CELL COUNT + DIFF, W/ CRYSTALS
Crystals, Fluid: NONE SEEN
WBC, Synovial: 3 /mm3 (ref 0–200)

## 2024-01-08 LAB — RENAL FUNCTION PANEL
Albumin: 1.7 g/dL — ABNORMAL LOW (ref 3.5–5.0)
Anion gap: 17 — ABNORMAL HIGH (ref 5–15)
BUN: 55 mg/dL — ABNORMAL HIGH (ref 8–23)
CO2: 21 mmol/L — ABNORMAL LOW (ref 22–32)
Calcium: 6.6 mg/dL — ABNORMAL LOW (ref 8.9–10.3)
Chloride: 96 mmol/L — ABNORMAL LOW (ref 98–111)
Creatinine, Ser: 6.47 mg/dL — ABNORMAL HIGH (ref 0.61–1.24)
GFR, Estimated: 8 mL/min — ABNORMAL LOW (ref >60)
Glucose, Bld: 101 mg/dL — ABNORMAL HIGH (ref 70–99)
Phosphorus: 5.2 mg/dL — ABNORMAL HIGH (ref 2.5–4.6)
Potassium: 4.6 mmol/L (ref 3.5–5.1)
Sodium: 134 mmol/L — ABNORMAL LOW (ref 135–145)

## 2024-01-08 LAB — RESP PANEL BY RT-PCR (RSV, FLU A&B, COVID)  RVPGX2
Influenza A by PCR: NEGATIVE
Influenza B by PCR: NEGATIVE
Resp Syncytial Virus by PCR: NEGATIVE
SARS Coronavirus 2 by RT PCR: NEGATIVE

## 2024-01-08 LAB — AMMONIA: Ammonia: 31 umol/L (ref 9–35)

## 2024-01-08 LAB — I-STAT CG4 LACTIC ACID, ED
Lactic Acid, Venous: 2 mmol/L (ref 0.5–1.9)
Lactic Acid, Venous: 2.2 mmol/L (ref 0.5–1.9)

## 2024-01-08 LAB — HEPATITIS B SURFACE ANTIGEN: Hepatitis B Surface Ag: NONREACTIVE

## 2024-01-08 LAB — ECHOCARDIOGRAM COMPLETE
AR max vel: 1.47 cm2
AV Area VTI: 1.56 cm2
AV Area mean vel: 1.45 cm2
AV Mean grad: 5 mmHg
AV Peak grad: 10.2 mmHg
Ao pk vel: 1.6 m/s
Area-P 1/2: 5.02 cm2
Calc EF: 59.3 %
Height: 71 in
S' Lateral: 2.6 cm
Single Plane A2C EF: 53 %
Single Plane A4C EF: 67.5 %
Weight: 3360 [oz_av]

## 2024-01-08 LAB — MAGNESIUM: Magnesium: 1 mg/dL — ABNORMAL LOW (ref 1.7–2.4)

## 2024-01-08 MED ORDER — PANTOPRAZOLE SODIUM 40 MG PO TBEC
40.0000 mg | DELAYED_RELEASE_TABLET | Freq: Every day | ORAL | Status: DC
  Administered 2024-01-08 – 2024-01-14 (×7): 40 mg via ORAL
  Filled 2024-01-08 (×8): qty 1

## 2024-01-08 MED ORDER — ALTEPLASE 2 MG IJ SOLR: 2.0000 mg | Freq: Once | INTRAMUSCULAR | Status: DC | PRN

## 2024-01-08 MED ORDER — ALBUMIN HUMAN 25 % IV SOLN
50.0000 g | Freq: Once | INTRAVENOUS | Status: AC
  Administered 2024-01-08: 50 g via INTRAVENOUS

## 2024-01-08 MED ORDER — SODIUM CHLORIDE 0.9 % IV SOLN
2.0000 g | Freq: Once | INTRAVENOUS | Status: AC
  Administered 2024-01-08: 2 g via INTRAVENOUS
  Filled 2024-01-08: qty 20

## 2024-01-08 MED ORDER — ROSUVASTATIN CALCIUM 5 MG PO TABS
10.0000 mg | ORAL_TABLET | Freq: Every day | ORAL | Status: DC
  Administered 2024-01-08 – 2024-01-14 (×7): 10 mg via ORAL
  Filled 2024-01-08 (×8): qty 2

## 2024-01-08 MED ORDER — MIDODRINE HCL 5 MG PO TABS
10.0000 mg | ORAL_TABLET | Freq: Three times a day (TID) | ORAL | Status: DC
  Administered 2024-01-08 – 2024-01-12 (×10): 10 mg via ORAL
  Filled 2024-01-08 (×12): qty 2

## 2024-01-08 MED ORDER — LIDOCAINE HCL (PF) 1 % IJ SOLN: 5.0000 mL | INTRAMUSCULAR | Status: DC | PRN

## 2024-01-08 MED ORDER — SODIUM CHLORIDE 0.9 % IV SOLN
1.0000 g | Freq: Once | INTRAVENOUS | Status: AC
  Administered 2024-01-08: 1 g via INTRAVENOUS
  Filled 2024-01-08 (×2): qty 10

## 2024-01-08 MED ORDER — SODIUM CHLORIDE 0.9 % IV SOLN
2.0000 g | INTRAVENOUS | Status: DC
  Administered 2024-01-08 – 2024-01-10 (×2): 2 g via INTRAVENOUS
  Filled 2024-01-08 (×2): qty 12.5

## 2024-01-08 MED ORDER — LIDOCAINE-PRILOCAINE 2.5-2.5 % EX CREA: 1.0000 | TOPICAL_CREAM | CUTANEOUS | Status: DC | PRN

## 2024-01-08 MED ORDER — MODAFINIL 100 MG PO TABS
200.0000 mg | ORAL_TABLET | Freq: Every day | ORAL | Status: DC
  Administered 2024-01-09 – 2024-01-14 (×6): 200 mg via ORAL
  Filled 2024-01-08 (×7): qty 2

## 2024-01-08 MED ORDER — ACETAMINOPHEN 500 MG PO TABS
1000.0000 mg | ORAL_TABLET | Freq: Once | ORAL | Status: AC
  Administered 2024-01-08: 1000 mg via ORAL
  Filled 2024-01-08: qty 2

## 2024-01-08 MED ORDER — SODIUM CHLORIDE 0.9 % IV SOLN: 2.0000 g | Freq: Every day | INTRAVENOUS | Status: DC

## 2024-01-08 MED ORDER — ACETAMINOPHEN 325 MG PO TABS
650.0000 mg | ORAL_TABLET | Freq: Four times a day (QID) | ORAL | Status: DC | PRN
  Filled 2024-01-08 (×2): qty 2

## 2024-01-08 MED ORDER — VANCOMYCIN HCL IN DEXTROSE 1-5 GM/200ML-% IV SOLN: 1000.0000 mg | Freq: Once | INTRAVENOUS | Status: DC

## 2024-01-08 MED ORDER — LIDOCAINE-EPINEPHRINE (PF) 2 %-1:200000 IJ SOLN
20.0000 mL | Freq: Once | INTRAMUSCULAR | Status: AC
  Administered 2024-01-08: 20 mL
  Filled 2024-01-08: qty 20

## 2024-01-08 MED ORDER — MIDODRINE HCL 5 MG PO TABS
5.0000 mg | ORAL_TABLET | Freq: Once | ORAL | Status: AC
  Administered 2024-01-08: 5 mg via ORAL
  Filled 2024-01-08: qty 1

## 2024-01-08 MED ORDER — HYDROMORPHONE HCL 1 MG/ML IJ SOLN
0.5000 mg | INTRAMUSCULAR | Status: DC | PRN
  Administered 2024-01-08: 1 mg via INTRAVENOUS
  Administered 2024-01-08: 0.5 mg via INTRAVENOUS
  Administered 2024-01-09: 1 mg via INTRAVENOUS
  Administered 2024-01-09: 0.5 mg via INTRAVENOUS
  Administered 2024-01-10 – 2024-01-11 (×2): 1 mg via INTRAVENOUS
  Filled 2024-01-08 (×6): qty 1

## 2024-01-08 MED ORDER — NEPRO/CARBSTEADY PO LIQD: 237.0000 mL | ORAL | Status: DC | PRN

## 2024-01-08 MED ORDER — ACETAMINOPHEN 650 MG RE SUPP: 650.0000 mg | Freq: Four times a day (QID) | RECTAL | Status: DC | PRN

## 2024-01-08 MED ORDER — METRONIDAZOLE 500 MG/100ML IV SOLN
500.0000 mg | Freq: Two times a day (BID) | INTRAVENOUS | Status: DC
  Administered 2024-01-08 – 2024-01-11 (×7): 500 mg via INTRAVENOUS
  Filled 2024-01-08 (×7): qty 100

## 2024-01-08 MED ORDER — ANTICOAGULANT SODIUM CITRATE 4% (200MG/5ML) IV SOLN: 5.0000 mL | Status: DC | PRN

## 2024-01-08 MED ORDER — ALLOPURINOL 100 MG PO TABS
200.0000 mg | ORAL_TABLET | Freq: Every day | ORAL | Status: DC
  Administered 2024-01-08 – 2024-01-14 (×7): 200 mg via ORAL
  Filled 2024-01-08 (×8): qty 2

## 2024-01-08 MED ORDER — INSULIN ASPART 100 UNIT/ML IJ SOLN: 0.0000 [IU] | Freq: Three times a day (TID) | INTRAMUSCULAR | Status: DC

## 2024-01-08 MED ORDER — LEVOTHYROXINE SODIUM 112 MCG PO TABS
112.0000 ug | ORAL_TABLET | Freq: Every day | ORAL | Status: DC
  Administered 2024-01-08 – 2024-01-15 (×8): 112 ug via ORAL
  Filled 2024-01-08 (×8): qty 1

## 2024-01-08 MED ORDER — FENTANYL CITRATE PF 50 MCG/ML IJ SOSY
50.0000 ug | PREFILLED_SYRINGE | Freq: Once | INTRAMUSCULAR | Status: AC
  Administered 2024-01-08: 50 ug via INTRAVENOUS
  Filled 2024-01-08: qty 1

## 2024-01-08 MED ORDER — SEVELAMER CARBONATE 800 MG PO TABS
800.0000 mg | ORAL_TABLET | Freq: Three times a day (TID) | ORAL | Status: DC
  Administered 2024-01-08 – 2024-01-13 (×9): 800 mg via ORAL
  Filled 2024-01-08 (×16): qty 1

## 2024-01-08 MED ORDER — CHLORHEXIDINE GLUCONATE CLOTH 2 % EX PADS
6.0000 | MEDICATED_PAD | Freq: Every day | CUTANEOUS | Status: DC
  Administered 2024-01-10 – 2024-01-14 (×3): 6 via TOPICAL

## 2024-01-08 MED ORDER — LACTATED RINGERS IV BOLUS
1000.0000 mL | Freq: Once | INTRAVENOUS | Status: AC
  Administered 2024-01-08: 1000 mL via INTRAVENOUS

## 2024-01-08 MED ORDER — VANCOMYCIN HCL 2000 MG/400ML IV SOLN
2000.0000 mg | Freq: Once | INTRAVENOUS | Status: AC
  Administered 2024-01-08: 2000 mg via INTRAVENOUS
  Filled 2024-01-08: qty 400

## 2024-01-08 MED ORDER — HYDROMORPHONE HCL 1 MG/ML IJ SOLN
0.5000 mg | INTRAMUSCULAR | Status: DC | PRN
  Administered 2024-01-08: 0.5 mg via INTRAVENOUS
  Filled 2024-01-08: qty 1

## 2024-01-08 MED ORDER — ONDANSETRON HCL 4 MG/2ML IJ SOLN
4.0000 mg | Freq: Once | INTRAMUSCULAR | Status: AC
  Administered 2024-01-08: 4 mg via INTRAVENOUS
  Filled 2024-01-08: qty 2

## 2024-01-08 MED ORDER — LACTATED RINGERS IV BOLUS
250.0000 mL | Freq: Once | INTRAVENOUS | Status: AC
  Administered 2024-01-08: 250 mL via INTRAVENOUS

## 2024-01-08 MED ORDER — ONDANSETRON HCL 4 MG/2ML IJ SOLN
4.0000 mg | Freq: Four times a day (QID) | INTRAMUSCULAR | Status: DC | PRN
  Administered 2024-01-11 (×2): 4 mg via INTRAVENOUS
  Filled 2024-01-08 (×2): qty 2

## 2024-01-08 MED ORDER — HEPARIN SODIUM (PORCINE) 1000 UNIT/ML DIALYSIS: 1000.0000 [IU] | INTRAMUSCULAR | Status: DC | PRN

## 2024-01-08 MED ORDER — PENTAFLUOROPROP-TETRAFLUOROETH EX AERO: 1.0000 | INHALATION_SPRAY | CUTANEOUS | Status: DC | PRN

## 2024-01-08 MED ORDER — VANCOMYCIN HCL IN DEXTROSE 1-5 GM/200ML-% IV SOLN
1000.0000 mg | INTRAVENOUS | Status: DC
Start: 2024-01-08 — End: 2024-01-14
  Administered 2024-01-09 – 2024-01-13 (×3): 1000 mg via INTRAVENOUS
  Filled 2024-01-08 (×3): qty 200

## 2024-01-08 NOTE — ED Triage Notes (Signed)
 PT brought in by [facility], PT is normally A&OX4 but is alert to name year and location but not day or month. PT had a temp 100.0 and RR was noted to be 26 times a min. PT right knee is swollen and red/hot

## 2024-01-08 NOTE — Progress Notes (Signed)
 Pharmacy Antibiotic Note  [name] is a 73 y.o. male admitted on 01/08/2024 with infection of unclear source.  Pharmacy has been consulted for vancomycin  and cefepime  dosing.  Patient afebrile, WBC 15.1, lactate 2.2; CT chest, abdomen, pelvis unremarkable. Patient with history of ESRD on HD baseline (outpatient schedule TTS; would be due for HD today). Potential source of infection unclear; initiating broad spectrum antibiotics with potential to de-escalate in future as able.   Patient has received 1x dose of ceftriaxone  2g on 10/2 at 0500 and vancomycin  2000 mg on 10/2 at 0530.   Plan: Plan for vancomycin  1000 mg qHD session; will follow up HD plans/schedule for placing orders.   Plan for cefepime  1g Q24H; will follow up HD plans/schedule for placing orders. Will give initial 1g of cefepime  now.   Monitor s/sx infection, cultures as available, and vancomycin  levels as appropriate.   Height: 5' 11 (180.3 cm) Weight: 95.3 kg (210 lb) IBW/kg (Calculated) : 75.3  Temp (24hrs), Avg:98.7 F (37.1 C), Min:97.9 F (36.6 C), Max:100.1 F (37.8 C)  Recent Labs  Lab 01/08/24 0046 01/08/24 0053 01/08/24 0247 01/08/24 0541  WBC 11.6*  --   --  15.1*  CREATININE 6.73*  --   --  6.47*  LATICACIDVEN  --  2.0* 2.2*  --     Estimated Creatinine Clearance: 11.8 mL/min (A) (by C-G formula based on SCr of 6.47 mg/dL (H)).    Allergies  Allergen Reactions   Morphine  Other (See Comments)    Other reaction(s): Delusions (intolerance)  Other Reaction(s): delusions   Mushroom Extract Complex (Obsolete) Nausea Only   Antimicrobials this admission: Ceftriaxone  2g IV x1 dose 10/2 Vancomycin  2g IV x1 dose 10/2  Cefepime  1g Q24H 10/2>> Vancomycin  1g qHD session 10/22>> Metronidazole  500mg  Q12H 10/22>>  Dose adjustments this admission: None  Microbiology results: 10/2 Blood cultures: pending 10/2 flu/COVID/RSV: negative 10/2 knee fluid culture: pending; no organisms seen on Gram  stain  Thank you for allowing pharmacy to be a part of this patient's care.  Maurilio Patten, PharmD PGY1 Pharmacy Resident [facility] 01/08/2024 10:11 AM

## 2024-01-08 NOTE — Assessment & Plan Note (Addendum)
 Meeting borderline SIRS criteria with noted Tmax 100.1, respiration in the 20s, white count of near 12 Confounding issues include ESRD as well as systolic pressures in the 80s Lactate of 2-2.2 Noted right knee swelling on initial presentation with fairly normal fluid analysis Chest x-ray stable CT chest abdomen pelvis with otherwise stable findings No overt identifiable source at present  Started on empiric Rocephin  and vancomycin  in the ER  Will broaden to cefepime , vanc and flagyl   De-escalate as appropriate

## 2024-01-08 NOTE — Assessment & Plan Note (Signed)
 PPI

## 2024-01-08 NOTE — Assessment & Plan Note (Signed)
 Rate controlled atrial fibrillation on presentation  Not an anticoagulation in setting of hx/o GU bleeding

## 2024-01-08 NOTE — Consult Note (Addendum)
 NAME:  [name], MRN:  [id number], DOB:  March 28, 1951, LOS: 0 ADMISSION DATE:  01/08/2024, CONSULTATION DATE:  01/08/2024 REFERRING MD:  Elspeth Masters MD, CHIEF COMPLAINT:  hypotension   History of Present Illness:  [name] is a 73 yo with PHM significant for permanent atrial fibrillation not on AC due to GI bleed, chonic anemia, prior CABG 2005, PAD, DM c/b bilateral BKA, gout, ESRD on HD TTS , persistent ascites secondary to cirrhosis with recurrent paracentesis brought to [facility] by EMS for persistent R knee pain and found to be hypotensive on admission.   Patient has been adherent to medications, medical appointments, and dialysis (9/28). Last paracentesis on 9/23. Initialy denied respiratory symptoms, chest, or abdominal pain. Initial vitals with Tmax 100.1, HR <100, MAP 60-70. R knee synovial fluid obtained from ED without crystals and 3 Wbcs  Patient was initiated on BS Abx treatment, given midodrine, albumin , and admitted to  hospitalist service.  PCCM was consulted given hypotension.  Pertinent  Medical History   Past Medical History:  Diagnosis Date   Acute osteomyelitis of metatarsal bone of left foot (HCC)    Ambulates with cane    straight cane   Anemia    Cervical myelopathy (HCC) 02/06/2018   Chronic kidney disease    dailysis M W F- home   Community acquired pneumonia of left lower lobe of lung 08/18/2021   Complication of anesthesia    Coronary artery disease    Dehiscence of amputation stump of left lower extremity (HCC)    Diabetes mellitus without complication (HCC)    type2   Diabetic foot ulcer (HCC)    Diabetic neuropathy (HCC) 02/06/2018   Dysrhythmia    Gait abnormality 08/24/2018   GERD (gastroesophageal reflux disease)     01/06/20- not current   Gout    History of blood transfusion    History of blood transfusion    History of kidney stones    passed stones   Hypercholesteremia    Hypertension    Hypothyroidism    Neuromuscular disorder (HCC)     neuropathy left leg and bilateral feet   Neuropathy    Partial nontraumatic amputation of foot, 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   PONV (postoperative nausea and vomiting)    Prostate cancer (HCC)    PVD (peripheral vascular disease)    with amputations     Significant Hospital Events: Including procedures, antibiotic start and stop dates in addition to other pertinent events   Admitted to the hospital for knee pain and hypotension  Interim History / Subjective:  Per patient, has been feeling at usual state until yesterday when he noted his R knee to hurt. Denies orthostatic symptoms. Able to ambulate with prosthesis. Denies chest pain, fevers, shortness of breath, nausea or vomiting changes in bowel habits. Declines recent bleeds, black stools, abdominal pain. Baseline neuropathic pain   He does note that there is worsening abdominal distension since his last paracentesis on 9/9 and again on 9/23.   Per patient, his SBP range between 80-100s,especially after hD. Has been taking Torsemide  and and spironolactone, which he has been taking with urine production. Last HD 9/28.  He feels comfortable other than the knee discomfort. Currently receiving albumin  and s/p midodrine 5 mg with MAP of 75.  Objective   Blood pressure 101/65, pulse (!) 56, temperature 98 F (36.7 C), temperature source Oral, resp. rate 19, height 5' 11 (1.803 m), weight 95.3 kg, SpO2 100%.  Intake/Output Summary (Last 24 hours) at 01/08/2024 1106 Last data filed at 01/08/2024 9385 Gross per 24 hour  Intake 1350.64 ml  Output --  Net 1350.64 ml   Filed Weights   01/08/24 0035  Weight: 95.3 kg    Examination: General: Well appearing elderly male in NAD HENT: MMM Lungs: CTAB without wheezing Cardiovascular: irregular rhythm, no murmurs Abdomen: distended, + fluid wave. +BS. Extremities: LUE fistula Neuro: alert and oriented x4, EOM intact, equal sensation on BUE and BLE. Lifts and and moves BL residual limbs  in LE   GU: deferred R bka: R knee with overlying erythema, tenderness to palpation mild, full range of motion, active and passive. No overt effusion noted. Bandage over arthrocentesis on the medial aspect of the knee L bka  Resolved Hospital Problem list     Assessment & Plan:   Hypotension Chronic low SBP in setting of cirrhosis with MAP <65 briefly on admission. Tmax 100.1. WBC 15.1 this AM. Currently being covered broadly for potential infection, suspected SBP. R knee arthrocentesis without significant pyruia or crystals. Currently stable on midodrine and albumin  infusion. - Continue broad spectrum antibiotics - Continue midodrine and albumin  - Diagnostic paracentesis to rule out SBP - Follow up Bcx and WBC trend -  Awaiting TTE -  Assess orthostatics  Recurrent ascites Cirrhosis per CT A/P 2025 Liver with nodularity. PTA SAAG high, with high protein; not consistennt with liver disease. EGD without portal hypertension findings. Thought to be from cardiac congestion. Received recurrent paracentesis through IR, last one done on 12/16/2023 with 7.1L removed. Ct AP here with moderate to large volume ascites and diffuse wall edema - Hold Torsemide  40 daily - Hld Spironolactone 25 mg daily - Diagnostic paracentesis - Midodrine TID  Knee pain Problems with orthostics PTA. Synovial fluid no onsistent with infection or gout - Will need orthostics - Pain per primary  ESRD on HD TTS Oliguric Euvolemic at this time except for abdominal distension - iHD due today - nephrology consulted per primary - iHD may be limited due to MAP; will remain attentive if unable to procceed  Permanent trial fibrillation - No on AC prior to admission 2/2 massive GI bleed  Mild pulmonary hypertension  RSVP 39 on TTE 11/2022  Anemia of chronic disease/ESRD 12 hgb today; no signs of bleeding  Thrombocytopenia Secondary to liver disease. 140 today  DM On Tresiba  10 units daily PTA - manegement per  primary  Hypothyroidism On 112mcg levothyroxine  PTA  Neuro/Encephalopathy Diabetic neuropathy - Lyrica  150 MG tid since 9/15; caution with hypotension  Cognitive impairment Atrophy on MRI. Proper thought content and capcity today - PTA modafinil , vitamin D  supplementation  GERD -ON ppi pta  BL BKA - On continuous PT  Gout On PTA allopurinol    Best Practice (right click and Reselect all SmartList Selections daily)   Diet/type: Regular consistency (see orders) DVT prophylaxis: other GI prophylaxis: PPI Lines:  Foley:  none Code Status:  full code Last date of multidisciplinary goals of care discussion []   Labs   CBC: Recent Labs  Lab 01/08/24 0046 01/08/24 0541  WBC 11.6* 15.1*  NEUTROABS 10.6* 13.5*  HGB 12.0* 10.5*  HCT 38.0* 34.2*  MCV 88.8 91.4  PLT 148* 141*    Basic Metabolic Panel: Recent Labs  Lab 01/08/24 0046 01/08/24 0541  NA 136 134*  K 5.7* 4.6  CL 99 96*  CO2 22 21*  GLUCOSE 76 101*  BUN 58* 55*  CREATININE 6.73* 6.47*  CALCIUM  7.3* 6.6*  MG  --  1.0*  PHOS  --  5.2*   GFR: Estimated Creatinine Clearance: 11.8 mL/min (A) (by C-G formula based on SCr of 6.47 mg/dL (H)). Recent Labs  Lab 01/08/24 0046 01/08/24 0053 01/08/24 0247 01/08/24 0541  WBC 11.6*  --   --  15.1*  LATICACIDVEN  --  2.0* 2.2*  --     Liver Function Tests: Recent Labs  Lab 01/08/24 0046 01/08/24 0541  AST 22  --   ALT 12  --   ALKPHOS 165*  --   BILITOT 1.0  --   PROT 4.9*  --   ALBUMIN  2.3* 1.7*   No results for input(s): LIPASE, AMYLASE in the last 168 hours. No results for input(s): AMMONIA in the last 168 hours.  ABG    Component Value Date/Time   PHART 7.474 (H) 07/23/2022 0023   PCO2ART 37.6 07/23/2022 0023   PO2ART 483 (H) 07/23/2022 0023   HCO3 27.4 07/23/2022 0023   TCO2 29 07/26/2022 1018   O2SAT 100 07/23/2022 0023     Coagulation Profile: Recent Labs  Lab 01/08/24 0046  INR 1.1    Cardiac Enzymes: No results  for input(s): CKTOTAL, CKMB, CKMBINDEX, TROPONINI in the last 168 hours.  HbA1C: Hgb A1c MFr Bld  Date/Time Value Ref Range Status  10/29/2023 09:30 AM 4.9 4.8 - 5.6 % Final    Comment:    (NOTE) Diagnosis of Diabetes The following HbA1c ranges recommended by the American Diabetes Association (ADA) may be used as an aid in the diagnosis of diabetes mellitus.  Hemoglobin             Suggested A1C NGSP%              Diagnosis  <5.7                   Non Diabetic  5.7-6.4                Pre-Diabetic  >6.4                   Diabetic  <7.0                   Glycemic control for                       adults with diabetes.    05/17/2022 12:52 PM 5.0 4.8 - 5.6 % Final    Comment:    (NOTE) Pre diabetes:          5.7%-6.4%  Diabetes:              >6.4%  Glycemic control for   <7.0% adults with diabetes     CBG: No results for input(s): GLUCAP in the last 168 hours.  Review of Systems:     Past Medical History:  He,  has a past medical history of Acute osteomyelitis of metatarsal bone of left foot (HCC), Ambulates with cane, Anemia, Cervical myelopathy (HCC) (02/06/2018), Chronic kidney disease, Community acquired pneumonia of left lower lobe of lung (08/18/2021), Complication of anesthesia, Coronary artery disease, Dehiscence of amputation stump of left lower extremity (HCC), Diabetes mellitus without complication (HCC), Diabetic foot ulcer (HCC), Diabetic neuropathy (HCC) (02/06/2018), Dysrhythmia, Gait abnormality (08/24/2018), GERD (gastroesophageal reflux disease), Gout, History of blood transfusion, History of blood transfusion, History of kidney stones, Hypercholesteremia, Hypertension, Hypothyroidism, Neuromuscular disorder (HCC), Neuropathy, Partial nontraumatic amputation of foot, left (HCC) (02/20/2021), PONV (postoperative nausea and  vomiting), Prostate cancer (HCC), and PVD (peripheral vascular disease).   Surgical History:   Past Surgical History:   Procedure Laterality Date   A-FLUTTER ABLATION N/A 04/06/2020   Procedure: A-FLUTTER ABLATION;  Surgeon: Waddell Danelle ORN, MD;  Location: [facility] INVASIVE CV LAB;  Service: Cardiovascular;  Laterality: N/A;   A/V FISTULAGRAM N/A 03/05/2023   Procedure: A/V Fistulagram;  Surgeon: Pearline [name] RAMAN, MD;  Location: [facility] INVASIVE CV LAB;  Service: Vascular;  Laterality: N/A;   A/V SHUNT INTERVENTION Left 11/07/2023   Procedure: A/V SHUNT INTERVENTION;  Surgeon: Gretta Lonni PARAS, MD;  Location: [facility] PV LAB;  Service: Cardiovascular;  Laterality: Left;   AMPUTATION Left 12/25/2013   Procedure: AMPUTATION RAY LEFT 5TH RAY;  Surgeon: Jerona Harden GAILS, MD;  Location: [facility] ORS;  Service: Orthopedics;  Laterality: Left;   AMPUTATION Right 12/15/2018   Procedure: AMPUTATION OF 4TH AND 5TH TOES RIGHT FOOT;  Surgeon: Harden Jerona GAILS, MD;  Location: [facility] OR;  Service: Orthopedics;  Laterality: Right;   AMPUTATION Left 02/02/2021   Procedure: LEFT FOOT 4TH RAY AMPUTATION;  Surgeon: Harden Jerona GAILS, MD;  Location: [facility] OR;  Service: Orthopedics;  Laterality: Left;   AMPUTATION Left 05/09/2021   Procedure: LEFT BELOW KNEE AMPUTATION;  Surgeon: Harden Jerona GAILS, MD;  Location: [facility] OR;  Service: Orthopedics;  Laterality: Left;   AMPUTATION Right 07/26/2022   Procedure: RIGHT BELOW KNEE AMPUTATION;  Surgeon: Harden Jerona GAILS, MD;  Location: [facility] OR;  Service: Orthopedics;  Laterality: Right;   APPLICATION OF WOUND VAC Right 12/15/2018   Procedure: APPLICATION OF WOUND VAC;  Surgeon: Harden Jerona GAILS, MD;  Location: [facility] OR;  Service: Orthopedics;  Laterality: Right;   AV FISTULA PLACEMENT Left 03/25/2019   Procedure: LEFT ARM ARTERIOVENOUS (AV) FISTULA CREATION;  Surgeon: Serene Gaile ORN, MD;  Location: [facility] OR;  Service: Vascular;  Laterality: Left;   BACK SURGERY     BASCILIC VEIN TRANSPOSITION Left 05/20/2019   Procedure: SECOND STAGE LEFT BASCILIC VEIN TRANSPOSITION;  Surgeon: Serene Gaile ORN, MD;  Location: [facility] OR;  Service: Vascular;   Laterality: Left;   BIOPSY  06/22/2022   Procedure: BIOPSY;  Surgeon: Wilhelmenia Aloha Raddle., MD;  Location: [facility] ENDOSCOPY;  Service: Gastroenterology;;   CARDIAC CATHETERIZATION  02/17/2014   CATARACT EXTRACTION Bilateral    CHOLECYSTECTOMY     COLONOSCOPY  2011   in [place], Normal   COLONOSCOPY N/A 06/23/2022   Procedure: COLONOSCOPY;  Surgeon: Legrand Victory LITTIE DOUGLAS, MD;  Location: [facility] ENDOSCOPY;  Service: Gastroenterology;  Laterality: N/A;   CORONARY ARTERY BYPASS GRAFT  2008   CYSTOSCOPY N/A 06/29/2020   Procedure: CYSTOSCOPY WITH CLOT EVACUATION AND  FULGERATION;  Surgeon: Matilda Senior, MD;  Location: [facility] OR;  Service: Urology;  Laterality: N/A;   CYSTOSCOPY WITH FULGERATION Bilateral 06/17/2020   Procedure: CYSTOSCOPY,BILATERAL RETROGRADE, CLOT EVACUATION WITH FULGERATION OF THE BLADDER;  Surgeon: Selma Donnice SAUNDERS, MD;  Location: [facility] OR;  Service: Urology;  Laterality: Bilateral;   CYSTOSCOPY WITH FULGERATION N/A 06/28/2020   Procedure: CYSTOSCOPY WITH CLOT EVACUATION AND FULGERATION OF BLEEDERS;  Surgeon: Matilda Senior, MD;  Location: [facility] OR;  Service: Urology;  Laterality: N/A;  1 HR   ENTEROSCOPY N/A 06/22/2022   Procedure: ENTEROSCOPY;  Surgeon: Wilhelmenia Aloha Raddle., MD;  Location: [facility] ENDOSCOPY;  Service: Gastroenterology;  Laterality: N/A;   HEMOSTASIS CLIP PLACEMENT  06/22/2022   Procedure: HEMOSTASIS CLIP PLACEMENT;  Surgeon: Wilhelmenia Aloha Raddle., MD;  Location: [facility] ENDOSCOPY;  Service: Gastroenterology;;   HOT HEMOSTASIS N/A  06/22/2022   Procedure: HOT HEMOSTASIS (ARGON PLASMA COAGULATION/BICAP);  Surgeon: Wilhelmenia Aloha Raddle., MD;  Location: [facility] ENDOSCOPY;  Service: Gastroenterology;  Laterality: N/A;   I & D EXTREMITY Right 12/15/2018   Procedure: DEBRIDEMENT RIGHT FOOT;  Surgeon: Harden Jerona GAILS, MD;  Location: [facility] OR;  Service: Orthopedics;  Laterality: Right;   I & D EXTREMITY Right 08/27/2019   Procedure: PARTIAL CUBOID EXCISION RIGHT FOOT;  Surgeon: Harden Jerona GAILS, MD;  Location: [facility] OR;  Service: Orthopedics;  Laterality: Right;   I & D EXTREMITY Right 01/07/2020   Procedure: RIGHT FOOT EXCISION INFECTED BONE;  Surgeon: Harden Jerona GAILS, MD;  Location: [facility] OR;  Service: Orthopedics;  Laterality: Right;   I & D EXTREMITY Right 05/17/2022   Procedure: IRRIGATION AND DEBRIDEMENT RIGHT FOOT ABSCESS;  Surgeon: Harden Jerona GAILS, MD;  Location: [facility] OR;  Service: Orthopedics;  Laterality: Right;   IR PARACENTESIS  10/11/2022   IR PARACENTESIS  12/24/2022   IR PARACENTESIS  02/06/2023   IR PARACENTESIS  05/05/2023   IR PARACENTESIS  05/19/2023   IR PARACENTESIS  07/11/2023   IR PARACENTESIS  07/30/2023   IR PARACENTESIS  08/20/2023   IR PARACENTESIS  09/19/2023   IR PARACENTESIS  10/03/2023   IR PARACENTESIS  10/17/2023   IR PARACENTESIS  10/30/2023   IR PARACENTESIS  11/19/2023   IR PARACENTESIS  12/03/2023   IR PARACENTESIS  12/16/2023   NECK SURGERY     novemver 2019   PERIPHERAL VASCULAR BALLOON ANGIOPLASTY Left 03/05/2023   Procedure: PERIPHERAL VASCULAR BALLOON ANGIOPLASTY;  Surgeon: Pearline [name] RAMAN, MD;  Location: [facility] INVASIVE CV LAB;  Service: Vascular;  Laterality: Left;  AVF   POLYPECTOMY  06/22/2022   Procedure: POLYPECTOMY;  Surgeon: Mansouraty, Aloha Raddle., MD;  Location: [facility] ENDOSCOPY;  Service: Gastroenterology;;   POLYPECTOMY  06/23/2022   Procedure: POLYPECTOMY;  Surgeon: Legrand Victory LITTIE DOUGLAS, MD;  Location: [facility] ENDOSCOPY;  Service: Gastroenterology;;   STUMP REVISION Left 06/27/2021   Procedure: REVISION LEFT BELOW KNEE AMPUTATION;  Surgeon: Harden Jerona GAILS, MD;  Location: [facility] OR;  Service: Orthopedics;  Laterality: Left;   SUBMUCOSAL LIFTING INJECTION  06/22/2022   Procedure: SUBMUCOSAL LIFTING INJECTION;  Surgeon: Wilhelmenia Aloha Raddle., MD;  Location: [facility] ENDOSCOPY;  Service: Gastroenterology;;   SUBMUCOSAL TATTOO INJECTION  06/22/2022   Procedure: SUBMUCOSAL TATTOO INJECTION;  Surgeon: Wilhelmenia Aloha Raddle., MD;  Location: [facility] ENDOSCOPY;   Service: Gastroenterology;;   TRANSURETHRAL RESECTION OF PROSTATE N/A 06/28/2020   Procedure: TRANSURETHRAL RESECTION OF THE PROSTATE (TURP);  Surgeon: Matilda Senior, MD;  Location: [facility] OR;  Service: Urology;  Laterality: N/A;   VENOUS ANGIOPLASTY  11/07/2023   Procedure: VENOUS ANGIOPLASTY;  Surgeon: Gretta Lonni PARAS, MD;  Location: [facility] PV LAB;  Service: Cardiovascular;;  Basilic Vein, Subclavian Vein   WISDOM TOOTH EXTRACTION       Social History:   reports that he quit smoking about 35 years ago. His smoking use included cigars. He started smoking about 50 years ago. He has never used smokeless tobacco. He reports that he does not drink alcohol  and does not use drugs.   Family History:  His family history includes CAD in his father; Cancer in his father; Diabetes Mellitus II in his brother, brother, father, and mother; Kidney disease in his brother, brother, father, and mother; Stomach cancer (age of onset: 56) in his brother. There is no history of Colon cancer, Colon polyps, Esophageal cancer, Rectal cancer, or Pancreatic cancer.   Allergies Allergies  Allergen  Reactions   Morphine  Other (See Comments)    Other reaction(s): Delusions (intolerance)  Other Reaction(s): delusions   Mushroom Extract Complex (Obsolete) Nausea Only     Home Medications  Prior to Admission medications   Medication Sig Start Date End Date Taking? Authorizing Provider  allopurinol  (ZYLOPRIM ) 100 MG tablet Take 2 tablets (200 mg total) by mouth daily. 08/08/22  Yes Angiulli, Toribio PARAS, PA-C  ibuprofen  (ADVIL ) 200 MG tablet Take 600 mg by mouth every 6 (six) hours as needed for fever, headache or mild pain (pain score 1-3).   Yes [provider]  levothyroxine  (SYNTHROID ) 112 MCG tablet Take 112 mcg by mouth daily before breakfast.   Yes [provider]  modafinil  (PROVIGIL ) 200 MG tablet Take 1 tablet (200 mg total) by mouth daily. 12/22/23  Yes Raulkar, Sven SQUIBB, MD  pantoprazole   (PROTONIX ) 40 MG tablet Take 1 tablet (40 mg total) by mouth daily. 12/19/23  Yes [name], NP  pregabalin  (LYRICA ) 150 MG capsule Take 1 capsule (150 mg total) by mouth 4 (four) times daily. Patient taking differently: Take 150 mg by mouth 2 (two) times daily. 12/22/23  Yes Raulkar, Sven SQUIBB, MD  propranolol  (INDERAL ) 40 MG tablet Take 40 mg by mouth daily. 03/24/23  Yes [provider]  rosuvastatin  (CRESTOR ) 10 MG tablet Take 1 tablet (10 mg total) by mouth daily. 12/19/23 03/18/24 Yes [name], NP  sevelamer  carbonate (RENVELA ) 800 MG tablet Take 800 mg by mouth 3 (three) times daily. 01/17/23  Yes [provider]  spironolactone (ALDACTONE) 25 MG tablet Take 25 mg by mouth daily. 12/14/23  Yes [provider]  torsemide  (DEMADEX ) 20 MG tablet Take 1 tablet (20 mg total) by mouth 2 (two) times daily. Patient taking differently: Take 20 mg by mouth daily. 06/16/23  Yes Ladona Heinz, MD  tiZANidine  (ZANAFLEX ) 2 MG tablet Take 2 mg by mouth 2 (two) times daily as needed. Patient not taking: Reported on 01/08/2024 12/14/23   [provider]     Critical care time:       Hadassah Kristy Ahr, MD [facility] Internal Medicine Program - PGY-3 01/08/2024, 11:06 AM

## 2024-01-08 NOTE — H&P (Addendum)
 History and Physical    Patient: [name] FMW:[id number] DOB: Jun 30, 1950 DOA: 01/08/2024 DOS: the patient was seen and examined on 01/08/2024 PCP: [name], NP  Patient coming from: Home  Chief Complaint:  Chief Complaint  Patient presents with   Knee Pain   HPI: [name] is a 73 y.o. male with medical history significant of permanent atrial fibrillation not on anticoagulation due to GI bleed, history of chronic anemia, prior CABG, thrombocytopenia, bilateral BKA's, hypertension, hypothyroidism, diabetes mellitus type 2, ESRD on dialysis Tuesday Thursday Saturday, recurrent ascites presenting with SIRS, encephalopathy, right knee pain.  Limited history in the setting of encephalopathy.  Per report, patient was brought in by EMS for issues including right knee pain.  No reports of falls.  Noted history of bilateral BKA's.  No reports of any cuts or abrasions involving the right knee prior to presentation.  No chest pain or belly pain.  No cough or shortness of breath.  Patient noted to have been admitted July 22 through July 25 for issues including acute respiratory failure with suspected pneumonia and volume overload from CHF.  Patient reports trace cough that has otherwise improved.  No reported sick contacts.  No diarrhea.  Baseline hemodialysis Tuesday Thursday Saturday.  Patient denies any missed episodes of dialysis. Presented to the ER Tmax 100.1, heart rate 50s to 80s.  Blood pressure initially in the 1 teens.  Then progressed into the 80s.  White count 11.6, hemoglobin 12, platelets 148, creatinine 6.47.  BUN 55.  Lactate 2.0-2.2.  COVID flu and RSV negative.  Right knee synovial fluid obtained showing straw-colored, hazy appearance with no crystals, minimal WBCs.  Review of Systems: As mentioned in the history of present illness. All other systems reviewed and are negative. Past Medical History:  Diagnosis Date   Acute osteomyelitis of metatarsal bone of left foot (HCC)     Ambulates with cane    straight cane   Anemia    Cervical myelopathy (HCC) 02/06/2018   Chronic kidney disease    dailysis M [facility] F- home   Community acquired pneumonia of left lower lobe of lung 08/18/2021   Complication of anesthesia    Coronary artery disease    Dehiscence of amputation stump of left lower extremity (HCC)    Diabetes mellitus without complication (HCC)    type2   Diabetic foot ulcer (HCC)    Diabetic neuropathy (HCC) 02/06/2018   Dysrhythmia    Gait abnormality 08/24/2018   GERD (gastroesophageal reflux disease)     01/06/20- not current   Gout    History of blood transfusion    History of blood transfusion    History of kidney stones    passed stones   Hypercholesteremia    Hypertension    Hypothyroidism    Neuromuscular disorder (HCC)    neuropathy left leg and bilateral feet   Neuropathy    Partial nontraumatic amputation of foot, 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   PONV (postoperative nausea and vomiting)    Prostate cancer (HCC)    PVD (peripheral vascular disease)    with amputations   Past Surgical History:  Procedure Laterality Date   A-FLUTTER ABLATION N/A 04/06/2020   Procedure: A-FLUTTER ABLATION;  Surgeon: Waddell Danelle ORN, MD;  Location: [facility] INVASIVE CV LAB;  Service: Cardiovascular;  Laterality: N/A;   A/V FISTULAGRAM N/A 03/05/2023   Procedure: A/V Fistulagram;  Surgeon: Pearline [name] RAMAN, MD;  Location: [facility] INVASIVE CV LAB;  Service: Vascular;  Laterality: N/A;  A/V SHUNT INTERVENTION Left 11/07/2023   Procedure: A/V SHUNT INTERVENTION;  Surgeon: Gretta Lonni PARAS, MD;  Location: [facility] PV LAB;  Service: Cardiovascular;  Laterality: Left;   AMPUTATION Left 12/25/2013   Procedure: AMPUTATION RAY LEFT 5TH RAY;  Surgeon: Jerona Harden GAILS, MD;  Location: [facility] ORS;  Service: Orthopedics;  Laterality: Left;   AMPUTATION Right 12/15/2018   Procedure: AMPUTATION OF 4TH AND 5TH TOES RIGHT FOOT;  Surgeon: Harden Jerona GAILS, MD;  Location: [facility] OR;  Service: Orthopedics;   Laterality: Right;   AMPUTATION Left 02/02/2021   Procedure: LEFT FOOT 4TH RAY AMPUTATION;  Surgeon: Harden Jerona GAILS, MD;  Location: [facility] OR;  Service: Orthopedics;  Laterality: Left;   AMPUTATION Left 05/09/2021   Procedure: LEFT BELOW KNEE AMPUTATION;  Surgeon: Harden Jerona GAILS, MD;  Location: [facility] OR;  Service: Orthopedics;  Laterality: Left;   AMPUTATION Right 07/26/2022   Procedure: RIGHT BELOW KNEE AMPUTATION;  Surgeon: Harden Jerona GAILS, MD;  Location: [facility] OR;  Service: Orthopedics;  Laterality: Right;   APPLICATION OF WOUND VAC Right 12/15/2018   Procedure: APPLICATION OF WOUND VAC;  Surgeon: Harden Jerona GAILS, MD;  Location: [facility] OR;  Service: Orthopedics;  Laterality: Right;   AV FISTULA PLACEMENT Left 03/25/2019   Procedure: LEFT ARM ARTERIOVENOUS (AV) FISTULA CREATION;  Surgeon: Serene Gaile ORN, MD;  Location: [facility] OR;  Service: Vascular;  Laterality: Left;   BACK SURGERY     BASCILIC VEIN TRANSPOSITION Left 05/20/2019   Procedure: SECOND STAGE LEFT BASCILIC VEIN TRANSPOSITION;  Surgeon: Serene Gaile ORN, MD;  Location: [facility] OR;  Service: Vascular;  Laterality: Left;   BIOPSY  06/22/2022   Procedure: BIOPSY;  Surgeon: Wilhelmenia Aloha Raddle., MD;  Location: [facility] ENDOSCOPY;  Service: Gastroenterology;;   CARDIAC CATHETERIZATION  02/17/2014   CATARACT EXTRACTION Bilateral    CHOLECYSTECTOMY     COLONOSCOPY  2011   in [place], Normal   COLONOSCOPY N/A 06/23/2022   Procedure: COLONOSCOPY;  Surgeon: Legrand Victory LITTIE DOUGLAS, MD;  Location: [facility] ENDOSCOPY;  Service: Gastroenterology;  Laterality: N/A;   CORONARY ARTERY BYPASS GRAFT  2008   CYSTOSCOPY N/A 06/29/2020   Procedure: CYSTOSCOPY WITH CLOT EVACUATION AND  FULGERATION;  Surgeon: Matilda Senior, MD;  Location: [facility] OR;  Service: Urology;  Laterality: N/A;   CYSTOSCOPY WITH FULGERATION Bilateral 06/17/2020   Procedure: CYSTOSCOPY,BILATERAL RETROGRADE, CLOT EVACUATION WITH FULGERATION OF THE BLADDER;  Surgeon: Selma Donnice SAUNDERS, MD;  Location: [facility] OR;   Service: Urology;  Laterality: Bilateral;   CYSTOSCOPY WITH FULGERATION N/A 06/28/2020   Procedure: CYSTOSCOPY WITH CLOT EVACUATION AND FULGERATION OF BLEEDERS;  Surgeon: Matilda Senior, MD;  Location: [facility] OR;  Service: Urology;  Laterality: N/A;  1 HR   ENTEROSCOPY N/A 06/22/2022   Procedure: ENTEROSCOPY;  Surgeon: Wilhelmenia Aloha Raddle., MD;  Location: [facility] ENDOSCOPY;  Service: Gastroenterology;  Laterality: N/A;   HEMOSTASIS CLIP PLACEMENT  06/22/2022   Procedure: HEMOSTASIS CLIP PLACEMENT;  Surgeon: Wilhelmenia Aloha Raddle., MD;  Location: [facility] ENDOSCOPY;  Service: Gastroenterology;;   HOT HEMOSTASIS N/A 06/22/2022   Procedure: HOT HEMOSTASIS (ARGON PLASMA COAGULATION/BICAP);  Surgeon: Wilhelmenia Aloha Raddle., MD;  Location: [facility] ENDOSCOPY;  Service: Gastroenterology;  Laterality: N/A;   I & D EXTREMITY Right 12/15/2018   Procedure: DEBRIDEMENT RIGHT FOOT;  Surgeon: Harden Jerona GAILS, MD;  Location: [facility] OR;  Service: Orthopedics;  Laterality: Right;   I & D EXTREMITY Right 08/27/2019   Procedure: PARTIAL CUBOID EXCISION RIGHT FOOT;  Surgeon: Harden Jerona GAILS, MD;  Location: [facility] OR;  Service: Orthopedics;  Laterality: Right;   I & D EXTREMITY Right 01/07/2020   Procedure: RIGHT FOOT EXCISION INFECTED BONE;  Surgeon: Harden Jerona GAILS, MD;  Location: [facility] OR;  Service: Orthopedics;  Laterality: Right;   I & D EXTREMITY Right 05/17/2022   Procedure: IRRIGATION AND DEBRIDEMENT RIGHT FOOT ABSCESS;  Surgeon: Harden Jerona GAILS, MD;  Location: [facility] OR;  Service: Orthopedics;  Laterality: Right;   IR PARACENTESIS  10/11/2022   IR PARACENTESIS  12/24/2022   IR PARACENTESIS  02/06/2023   IR PARACENTESIS  05/05/2023   IR PARACENTESIS  05/19/2023   IR PARACENTESIS  07/11/2023   IR PARACENTESIS  07/30/2023   IR PARACENTESIS  08/20/2023   IR PARACENTESIS  09/19/2023   IR PARACENTESIS  10/03/2023   IR PARACENTESIS  10/17/2023   IR PARACENTESIS  10/30/2023   IR PARACENTESIS  11/19/2023   IR PARACENTESIS  12/03/2023   IR  PARACENTESIS  12/16/2023   NECK SURGERY     novemver 2019   PERIPHERAL VASCULAR BALLOON ANGIOPLASTY Left 03/05/2023   Procedure: PERIPHERAL VASCULAR BALLOON ANGIOPLASTY;  Surgeon: Pearline [name] RAMAN, MD;  Location: [facility] INVASIVE CV LAB;  Service: Vascular;  Laterality: Left;  AVF   POLYPECTOMY  06/22/2022   Procedure: POLYPECTOMY;  Surgeon: Mansouraty, Aloha Raddle., MD;  Location: [facility] ENDOSCOPY;  Service: Gastroenterology;;   POLYPECTOMY  06/23/2022   Procedure: POLYPECTOMY;  Surgeon: Legrand Victory LITTIE DOUGLAS, MD;  Location: [facility] ENDOSCOPY;  Service: Gastroenterology;;   STUMP REVISION Left 06/27/2021   Procedure: REVISION LEFT BELOW KNEE AMPUTATION;  Surgeon: Harden Jerona GAILS, MD;  Location: [facility] OR;  Service: Orthopedics;  Laterality: Left;   SUBMUCOSAL LIFTING INJECTION  06/22/2022   Procedure: SUBMUCOSAL LIFTING INJECTION;  Surgeon: Wilhelmenia Aloha Raddle., MD;  Location: [facility] ENDOSCOPY;  Service: Gastroenterology;;   SUBMUCOSAL TATTOO INJECTION  06/22/2022   Procedure: SUBMUCOSAL TATTOO INJECTION;  Surgeon: Wilhelmenia Aloha Raddle., MD;  Location: [facility] ENDOSCOPY;  Service: Gastroenterology;;   TRANSURETHRAL RESECTION OF PROSTATE N/A 06/28/2020   Procedure: TRANSURETHRAL RESECTION OF THE PROSTATE (TURP);  Surgeon: Matilda Senior, MD;  Location: [facility] OR;  Service: Urology;  Laterality: N/A;   VENOUS ANGIOPLASTY  11/07/2023   Procedure: VENOUS ANGIOPLASTY;  Surgeon: Gretta Lonni PARAS, MD;  Location: [facility] PV LAB;  Service: Cardiovascular;;  Basilic Vein, Subclavian Vein   WISDOM TOOTH EXTRACTION     Social History:  reports that he quit smoking about 35 years ago. His smoking use included cigars. He started smoking about 50 years ago. He has never used smokeless tobacco. He reports that he does not drink alcohol  and does not use drugs.  Allergies  Allergen Reactions   Morphine  Other (See Comments)    Other reaction(s): Delusions (intolerance)  Other Reaction(s): delusions   Mushroom Extract Complex (Obsolete)  Nausea Only    Family History  Problem Relation Age of Onset   Diabetes Mellitus II Mother    Kidney disease Mother    Diabetes Mellitus II Father    CAD Father    Cancer Father        prostate   Kidney disease Father    Diabetes Mellitus II Brother    Kidney disease Brother    Diabetes Mellitus II Brother    Stomach cancer Brother 42   Kidney disease Brother    Colon cancer Neg Hx    Colon polyps Neg Hx    Esophageal cancer Neg Hx    Rectal cancer Neg Hx    Pancreatic cancer Neg Hx  Prior to Admission medications   Medication Sig Start Date End Date Taking? Authorizing Provider  allopurinol  (ZYLOPRIM ) 100 MG tablet Take 2 tablets (200 mg total) by mouth daily. 08/08/22  Yes Angiulli, Toribio PARAS, PA-C  ibuprofen  (ADVIL ) 200 MG tablet Take 600 mg by mouth every 6 (six) hours as needed for fever, headache or mild pain (pain score 1-3).   Yes [provider]  levothyroxine  (SYNTHROID ) 112 MCG tablet Take 112 mcg by mouth daily before breakfast.   Yes [provider]  modafinil  (PROVIGIL ) 200 MG tablet Take 1 tablet (200 mg total) by mouth daily. 12/22/23  Yes Raulkar, Sven SQUIBB, MD  pantoprazole  (PROTONIX ) 40 MG tablet Take 1 tablet (40 mg total) by mouth daily. 12/19/23  Yes [name], NP  pregabalin  (LYRICA ) 150 MG capsule Take 1 capsule (150 mg total) by mouth 4 (four) times daily. Patient taking differently: Take 150 mg by mouth 2 (two) times daily. 12/22/23  Yes Raulkar, Sven SQUIBB, MD  propranolol  (INDERAL ) 40 MG tablet Take 40 mg by mouth daily. 03/24/23  Yes [provider]  rosuvastatin  (CRESTOR ) 10 MG tablet Take 1 tablet (10 mg total) by mouth daily. 12/19/23 03/18/24 Yes [name], NP  sevelamer  carbonate (RENVELA ) 800 MG tablet Take 800 mg by mouth 3 (three) times daily. 01/17/23  Yes [provider]  spironolactone (ALDACTONE) 25 MG tablet Take 25 mg by mouth daily. 12/14/23  Yes [provider]  torsemide   (DEMADEX ) 20 MG tablet Take 1 tablet (20 mg total) by mouth 2 (two) times daily. Patient taking differently: Take 20 mg by mouth daily. 06/16/23  Yes Ladona Heinz, MD  tiZANidine  (ZANAFLEX ) 2 MG tablet Take 2 mg by mouth 2 (two) times daily as needed. Patient not taking: Reported on 01/08/2024 12/14/23   [provider]    Physical Exam: Vitals:   01/08/24 0815 01/08/24 0830 01/08/24 0900 01/08/24 0930  BP: (!) 92/44 (!) 88/44 (!) 94/43 (!) 93/52  Pulse: (!) 55 (!) 47 (!) 52 (!) 52  Resp: 17 18 16 17   Temp:      TempSrc:      SpO2: 95% 94% 96% 96%  Weight:      Height:       Physical Exam Constitutional:      Appearance: He is obese.     Comments: Mildly lethargic    HENT:     Head: Normocephalic and atraumatic.     Mouth/Throat:     Mouth: Mucous membranes are dry.  Eyes:     Pupils: Pupils are equal, round, and reactive to light.  Cardiovascular:     Rate and Rhythm: Normal rate and regular rhythm.  Pulmonary:     Effort: Pulmonary effort is normal.  Abdominal:     General: Bowel sounds are normal.     Tenderness: There is no abdominal tenderness.  Musculoskeletal:     Comments: S/p bilateral BKA    Neurological:     General: No focal deficit present.     Comments: Mild generalized lethargy and confusion    Psychiatric:        Mood and Affect: Mood normal.     Data Reviewed:  There are no new results to review at this time. CT CHEST ABDOMEN PELVIS WO CONTRAST CLINICAL DATA:  Sepsis.  EXAM: CT CHEST, ABDOMEN AND PELVIS WITHOUT CONTRAST  TECHNIQUE: Multidetector CT imaging of the chest, abdomen and pelvis was performed following the standard protocol without IV contrast.  RADIATION  DOSE REDUCTION: This exam was performed according to the departmental dose-optimization program which includes automated exposure control, adjustment of the mA and/or kV according to patient size and/or use of iterative reconstruction technique.  COMPARISON:   05/04/2023  FINDINGS: CT CHEST FINDINGS  Cardiovascular: The heart is enlarged. Coronary artery calcification is evident. Moderate atherosclerotic calcification is noted in the wall of the thoracic aorta. Enlargement of the pulmonary outflow tract/main pulmonary arteries suggests pulmonary arterial hypertension.  Mediastinum/Nodes: No mediastinal lymphadenopathy. No evidence for gross hilar lymphadenopathy although assessment is limited by the lack of intravenous contrast on the current study. The esophagus has normal imaging features. There is no axillary lymphadenopathy.  Lungs/Pleura: Fine architectural detail of lung parenchyma obscured by breathing motion. Within this limitation there is no pneumothorax or substantial pleural effusion. No dense focal consolidative airspace disease. Tiny calcified granuloma noted left lower lobe. Subsegmental atelectasis identified in the lingula and both lower lobes. The patchy nodular airspace disease seen previously in the lingula and both lower lobes appears to have resolved although again the motion artifact hinders assessment.  Musculoskeletal: No worrisome lytic or sclerotic osseous abnormality.  CT ABDOMEN PELVIS FINDINGS  Hepatobiliary: No suspicious focal abnormality in the liver on this study without intravenous contrast. Nodularity of liver contour consistent with cirrhosis. Cholecystectomy. No intrahepatic or extrahepatic biliary dilation.  Pancreas: No focal mass lesion. No dilatation of the main duct. No intraparenchymal cyst. No peripancreatic edema.  Spleen: No splenomegaly. No suspicious focal mass lesion.  Adrenals/Urinary Tract: 12 mm left adrenal nodule is likely an adenoma. 3.9 cm right adrenal mass is mildly heterogeneous with some areas demonstrating low attenuation suggesting adenoma this lesion has been present since 2016 when it measured 3 cm and demonstrated more substantial macroscopic fat density, features  suggesting benign etiology. 3.9 x 3.2 cm relatively low-density lesion upper pole right kidney is similar to prior and potentially a cyst given the relative stability over multiple studies. Both kidneys are atrophic. No evidence for hydroureteronephrosis. Bladder is decompressed.  Stomach/Bowel: Stomach is unremarkable. No gastric wall thickening. No evidence of outlet obstruction. Duodenum is normally positioned as is the ligament of Treitz. Hemostatic clip(s) identified in the duodenal lumen. No small bowel wall thickening. No small bowel dilatation. The terminal ileum is normal. The appendix is normal. No gross colonic mass. No colonic wall thickening. Diverticular changes are noted in the left colon without evidence of diverticulitis.  Vascular/Lymphatic: There is advanced atherosclerotic calcification of the abdominal aorta without aneurysm. There is no gastrohepatic or hepatoduodenal ligament lymphadenopathy. Mild retroperitoneal lymphadenopathy appears progressive in the interval. 14 mm short axis left para-aortic node identified on 83/3. 11 mm short axis retrocaval node on 79/3 (previously about 7 mm short axis). 11 mm short axis right common iliac node on 93/3 was 7 mm previously. 11 mm short axis right external iliac node identified on 01/16/3. 13 mm short axis right groin node on 136/3 was 8 mm previously.  Reproductive: Fiducial markers are identified in the prostate gland.Fluid in the scrotum suggests hydrocele with some probable edema in the soft tissues of the penis.  Other: Moderate to large volume ascites is similar to prior. Diffuse body wall edema evident.  Musculoskeletal: Right groin hernia contains free fluid. No worrisome lytic or sclerotic osseous abnormality. Advanced degenerative disc disease noted lumbar spine  IMPRESSION: 1. No acute findings in the chest. The patchy nodular airspace disease seen previously in the lingula and both lower lobes  appears to have resolved although again the motion  artifact hinders assessment for complete resolution. 2. Enlargement of the pulmonary outflow tract/main pulmonary arteries suggests pulmonary arterial hypertension. 3. Cirrhosis with moderate to large volume ascites, similar to prior. 4. Mild retroperitoneal and right pelvic sidewall lymphadenopathy appears progressive in the interval. This is nonspecific and may be reactive. Close follow-up recommended. 5. Fluid in the scrotum suggests hydrocele with some probable edema in the soft tissues of the penis. 6. Right groin hernia contains free fluid. 7.  Aortic Atherosclerosis (ICD10-I70.0).  Electronically Signed   By: Camellia Candle M.D.   On: 01/08/2024 05:48 DG Chest Port 1 View if patient is in a treatment room. EXAM: 1 VIEW(S) XRAY OF THE CHEST 01/08/2024 12:57:20 AM  COMPARISON: Comparison with 10/31/2023.  CLINICAL HISTORY: Suspected Sepsis.  FINDINGS:  LUNGS AND PLEURA: Low lung volumes accentuate pulmonary vascularity. No focal pulmonary opacity. No pulmonary edema. No pleural effusion. No pneumothorax.  HEART AND MEDIASTINUM: Low lung volumes accentuate cardiomediastinal silhouette. Sternotomy and CABG. No acute abnormality of the cardiac and mediastinal silhouettes.  BONES AND SOFT TISSUES: Sternotomy. No acute osseous abnormality.  IMPRESSION: 1. No acute findings.  Electronically signed by: [name] Gatlin MD 01/08/2024 01:03 AM EDT RP Workstation: [id number]  Lab Results  Component Value Date   WBC 15.1 (H) 01/08/2024   HGB 10.5 (L) 01/08/2024   HCT 34.2 (L) 01/08/2024   MCV 91.4 01/08/2024   PLT 141 (L) 01/08/2024   Last metabolic panel Lab Results  Component Value Date   GLUCOSE 101 (H) 01/08/2024   NA 134 (L) 01/08/2024   K 4.6 01/08/2024   CL 96 (L) 01/08/2024   CO2 21 (L) 01/08/2024   BUN 55 (H) 01/08/2024   CREATININE 6.47 (H) 01/08/2024   GFRNONAA 8 (L) 01/08/2024   CALCIUM  6.6 (L)  01/08/2024   PHOS 5.2 (H) 01/08/2024   PROT 4.9 (L) 01/08/2024   ALBUMIN  1.7 (L) 01/08/2024   LABGLOB 2.2 02/06/2018   BILITOT 1.0 01/08/2024   ALKPHOS 165 (H) 01/08/2024   AST 22 01/08/2024   ALT 12 01/08/2024   ANIONGAP 17 (H) 01/08/2024    Assessment and Plan: * SIRS (systemic inflammatory response syndrome) (HCC) Meeting borderline SIRS criteria with noted Tmax 100.1, respiration in the 20s, white count of near 12 Confounding issues include ESRD as well as systolic pressures in the 80s Lactate of 2-2.2 Noted right knee swelling on initial presentation with fairly normal fluid analysis Chest x-ray stable CT chest abdomen pelvis with otherwise stable findings No overt identifiable source at present  Started on empiric Rocephin  and vancomycin  in the ER  Will broaden to cefepime , vanc and flagyl   De-escalate as appropriate    Hypotension SBP in the 80s despite fluid bolus in setting of SIRS  Suspect multifactorial w/ contribution of SIRS, medication, ESRD, cirrhosis w/ third spacing, dCHF  Holding offending agents  Start midodrine  Will also consult PCCM given comorbidities  Judicious IVF given baseline volume status  Follow closely    Encephalopathy Mild generalized lethargy and confusion on presentation Toxic metabolic Suspect multifactorial with contributions of SIRS, ESRD, hypotension BUN 58 Ammonia level pending Nonfocal neuroexam on evaluation Monitor mentation with treatment   Permanent atrial fibrillation (HCC) Rate controlled atrial fibrillation on presentation  Not an anticoagulation in setting of hx/o GU bleeding    Ascites Recurrent ascites on CT today  Plan for paracentesis as hemodynamics tolerate  Follow     Right knee pain R knee pain and swelling on presentation in setting of SIRS and hypotension  S/p arthocentesis- less suggestive for infection or crystals  On IV vanc and rocephin  for broad spectrum coverage in the interim  Reassess as  appropriate     ESRD on dialysis (HCC) Baseline ESRD on HD TTS  Due for HD today  Will consult nephrology    CAD (coronary artery disease) Baseline history of CAD status post CABG No active chest pain EKG stable Monitor   Insulin  dependent type 2 diabetes mellitus (HCC) Blood sugar in 70s  Hold on SSI for now  Dextrose  infused fluids as clinically indicated   Anemia of chronic disease Hgb 12 today  Near baseline  No overt signs of bleeding at present  Monitor    Thrombocytopenia Platelet count stable low 140s today Monitor  Essential hypertension SBP in 80s at present  Hold BP regimen  Monitor    (HFpEF) heart failure with preserved ejection fraction (HCC) 2D ECHO 11/2022 with EF 60-65%, grade 1 mitral regurgitation, moderate TR, mild PH + volume overload and hypotension on presentation in setting of SIRS, ESRD, recurrent ascites  Will check 2D ECHO to correlate  Consult cardiology as clinically appropriate  Gastroesophageal reflux disease without esophagitis PPI   Pressure injury of skin Skin check pending to assess for any potential soft tissue source for SIRS      Advance Care Planning:   Code Status: Full Code   Consults: PCCM, Nephrology   Family Communication: No family at the bedside   Severity of Illness: The appropriate patient status for this patient is OBSERVATION. Observation status is judged to be reasonable and necessary in order to provide the required intensity of service to ensure the patient's safety. The patient's presenting symptoms, physical exam findings, and initial radiographic and laboratory data in the context of their medical condition is felt to place them at decreased risk for further clinical deterioration. Furthermore, it is anticipated that the patient will be medically stable for discharge from the hospital within 2 midnights of admission.   Author: Elspeth JINNY Masters, MD 01/08/2024 9:47 AM  For on call review  [email or web address].

## 2024-01-08 NOTE — Progress Notes (Signed)
  Echocardiogram 2D Echocardiogram has been performed.  [name] 01/08/2024, 1:52 PM

## 2024-01-08 NOTE — Progress Notes (Signed)
 Carryover admission to the Day Admitter.  I discussed this case with the EDP, Dr. Charmaine Bough.  Per these discussions:   This is a 73 year old male with residual disease on hemodialysis on Monday, Wednesday, Friday, who is being admitted with SIRS criteria with unclear source, after presenting with fever, altered mental status that has improved with improving fever.  The patient also noted right lower extremity erythema, including over the right knee.  He underwent arthrocentesis of the right knee with the EDP, with initial cell count/differential results less suggestive of joint.  Blood cultures x 2 were collected, COVID, influenza, RSV PCR all negative.  Chest x-ray showed no evidence of acute cardiopulmonary process, including no evidence of infiltrate, CT chest, abdomen, pelvis has been performed, formal radiology read currently pending.  The patient denies any abdominal discomfort and is reported to be nontender on exam.  He was started on IV vancomycin  and Rocephin  by EDP for potential right lower extremity cellulitis.  He does produce a small amount of urine, but would prefer to not undergo In-N-Out cath to obtain a urine sample at this time.  I have placed an order for inpatient admission for further evaluation and management of the above.  I have placed some additional preliminary admit orders via the adult multi-morbid admission order set. I have also ordered morning labs in the form of renal function panel, magnesium  level, CBC, and will defer additional decision making regarding antibiotics to the admitting hospitalist.    Eva Pore, DO Hospitalist

## 2024-01-08 NOTE — Progress Notes (Signed)
 Received patient in bed.Alert and oriented to person and place,sleepy but arousable.Consent for treatment verified.  Access used: Left arm avf that worked well.  Duration of treatment:  3.25 hours.  Uf goal: Met 2 liters,tolerated treatment;  Hand off to the patient's nurse ,back into his room with stable condition via transporter.

## 2024-01-08 NOTE — Assessment & Plan Note (Addendum)
 Recurrent ascites on CT today  Plan for paracentesis as hemodynamics tolerate  Follow

## 2024-01-08 NOTE — Assessment & Plan Note (Signed)
 Blood sugar in 70s  Hold on SSI for now  Dextrose  infused fluids as clinically indicated

## 2024-01-08 NOTE — Assessment & Plan Note (Signed)
 Platelet count stable low 140s today Monitor

## 2024-01-08 NOTE — Assessment & Plan Note (Signed)
 Baseline history of CAD status post CABG No active chest pain EKG stable Monitor

## 2024-01-08 NOTE — Assessment & Plan Note (Signed)
 R knee pain and swelling on presentation in setting of SIRS and hypotension  S/p arthocentesis- less suggestive for infection or crystals  On IV vanc and rocephin  for broad spectrum coverage in the interim  Reassess as appropriate

## 2024-01-08 NOTE — ED Provider Notes (Signed)
  EMERGENCY DEPARTMENT AT [facility] Provider Note   CSN: [id number] Arrival date & time: 01/08/24  0030     Patient presents with: Knee Pain   [name] is a 73 y.o. male.   HPI     This is a 73 year old male who presents with altered mental status.  Per wife, he has been complaining of knee pain and had a fall prior to arrival.  She had taken his temperature at home and did not note a fever but temperature 100.1 upon arrival.  Reports that he is normally oriented x 4 but only oriented to himself and place at this time.  He is only complaining of right knee pain.  She believes he fell because his knee was hurting.  She has noted it to be warm and red.  Denies recent upper respiratory symptoms.  Patient denies chest pain, shortness of breath, abdominal pain.  Prior to Admission medications   Medication Sig Start Date End Date Taking? Authorizing Provider  acetaminophen  (TYLENOL ) 325 MG tablet Take 2 tablets (650 mg total) by mouth every 4 (four) hours as needed for fever or mild pain. 08/08/22   Angiulli, Toribio PARAS, PA-C  allopurinol  (ZYLOPRIM ) 100 MG tablet Take 2 tablets (200 mg total) by mouth daily. 08/08/22   Angiulli, Toribio PARAS, PA-C  benzonatate  (TESSALON ) 100 MG capsule Take 1 capsule (100 mg total) by mouth 3 (three) times daily as needed for cough. 10/31/23   Sheikh, Omair Latif, DO  calcitRIOL  (ROCALTROL ) 0.5 MCG capsule Take 1 capsule (0.5 mcg total) by mouth 3 (three) times a week. 10/31/23   Sherrill Cable Latif, DO  chlorpheniramine-HYDROcodone  (TUSSIONEX) 10-8 MG/5ML Take 5 mLs by mouth every 12 (twelve) hours as needed for cough. 10/31/23   Sherrill Cable Latif, DO  HYDROcodone  bit-homatropine (HYCODAN) 5-1.5 MG/5ML syrup Take 5 mLs by mouth every 6 (six) hours as needed. 11/04/23   Burchette, Wolm ORN, MD  levothyroxine  (SYNTHROID ) 112 MCG tablet Take 1 tablet (112 mcg total) by mouth daily before breakfast. 05/22/21   Angiulli, Toribio PARAS, PA-C  Methoxy  PEG-Epoetin Beta (MIRCERA IJ) Inject into the skin. 08/20/22   [provider]  modafinil  (PROVIGIL ) 200 MG tablet Take 1 tablet (200 mg total) by mouth daily. 12/22/23   Raulkar, Sven SQUIBB, MD  OZEMPIC , 0.25 OR 0.5 MG/DOSE, 2 MG/3ML SOPN Inject 0.25 mg into the skin once a week. 09/19/21   [provider]  pantoprazole  (PROTONIX ) 40 MG tablet Take 1 tablet (40 mg total) by mouth daily. 12/19/23   [name], NP  pregabalin  (LYRICA ) 150 MG capsule Take 1 capsule (150 mg total) by mouth 4 (four) times daily. 12/22/23   Raulkar, Sven SQUIBB, MD  propranolol  (INDERAL ) 40 MG tablet Take 40 mg by mouth daily. 03/24/23   [provider]  rosuvastatin  (CRESTOR ) 10 MG tablet Take 1 tablet (10 mg total) by mouth daily. 12/19/23 03/18/24  [name], NP  sevelamer  carbonate (RENVELA ) 800 MG tablet Take 800 mg by mouth 3 (three) times daily. 01/17/23   [provider]  torsemide  (DEMADEX ) 20 MG tablet Take 1 tablet (20 mg total) by mouth 2 (two) times daily. 06/16/23   Ladona Heinz, MD    Allergies: Morphine  and Mushroom extract complex (obsolete)    Review of Systems  Constitutional:  Positive for fever.  Respiratory:  Negative for shortness of breath.   Cardiovascular:  Negative for chest pain.  Gastrointestinal:  Negative for abdominal pain.  Musculoskeletal:  Knee pain  Skin:  Positive for color change.  Psychiatric/Behavioral:  Positive for confusion.   All other systems reviewed and are negative.   Updated Vital Signs BP 110/64   Pulse 82   Temp 100.1 F (37.8 C) (Oral)   Resp 20   Ht 1.803 m (5' 11)   Wt 95.3 kg   SpO2 91%   BMI 29.29 kg/m   Physical Exam Vitals and nursing note reviewed.  Constitutional:      Appearance: He is well-developed. He is not ill-appearing.  HENT:     Head: Normocephalic and atraumatic.  Eyes:     Pupils: Pupils are equal, round, and reactive to light.  Cardiovascular:     Rate and Rhythm:  Regular rhythm. Tachycardia present.     Heart sounds: Normal heart sounds. No murmur heard. Pulmonary:     Effort: Pulmonary effort is normal. No respiratory distress.     Breath sounds: Normal breath sounds. No wheezing.  Abdominal:     General: Bowel sounds are normal.     Palpations: Abdomen is soft.     Tenderness: There is no abdominal tenderness. There is no rebound.  Musculoskeletal:     Cervical back: Neck supple.     Comments: Right BKA, right knee swollen and tender with effusion, pain with range of motion  Lymphadenopathy:     Cervical: No cervical adenopathy.  Skin:    General: Skin is warm and dry.  Neurological:     Mental Status: He is alert.     Comments: Oriented to self and place  Psychiatric:        Mood and Affect: Mood normal.     (all labs ordered are listed, but only abnormal results are displayed) Labs Reviewed  COMPREHENSIVE METABOLIC PANEL WITH GFR - Abnormal; Notable for the following components:      Result Value   Potassium 5.7 (*)    BUN 58 (*)    Creatinine, Ser 6.73 (*)    Calcium  7.3 (*)    Total Protein 4.9 (*)    Albumin  2.3 (*)    Alkaline Phosphatase 165 (*)    GFR, Estimated 8 (*)    All other components within normal limits  CBC WITH DIFFERENTIAL/PLATELET - Abnormal; Notable for the following components:   WBC 11.6 (*)    Hemoglobin 12.0 (*)    HCT 38.0 (*)    Platelets 148 (*)    Neutro Abs 10.6 (*)    Lymphs Abs 0.3 (*)    Abs Immature Granulocytes 0.27 (*)    All other components within normal limits  SYNOVIAL CELL COUNT + DIFF, W/ CRYSTALS - Abnormal; Notable for the following components:   Color, Synovial STRAW (*)    Appearance-Synovial HAZY (*)    All other components within normal limits  I-STAT CG4 LACTIC ACID, ED - Abnormal; Notable for the following components:   Lactic Acid, Venous 2.0 (*)    All other components within normal limits  I-STAT CG4 LACTIC ACID, ED - Abnormal; Notable for the following components:    Lactic Acid, Venous 2.2 (*)    All other components within normal limits  BODY FLUID CULTURE W GRAM STAIN  RESP PANEL BY RT-PCR (RSV, FLU A&B, COVID)  RVPGX2  CULTURE, BLOOD (ROUTINE X 2)  CULTURE, BLOOD (ROUTINE X 2)  PROTIME-INR  URINALYSIS, W/ REFLEX TO CULTURE (INFECTION SUSPECTED)  GLUCOSE, BODY FLUID OTHER            PROTEIN, BODY  FLUID (OTHER)    EKG: EKG Interpretation Date/Time:  Thursday January 08 2024 00:36:58 EDT Ventricular Rate:  84 PR Interval:    QRS Duration:  98 QT Interval:  372 QTC Calculation: 440 R Axis:   118  Text Interpretation: Atrial fibrillation Right axis deviation Confirmed by Bari Pfeiffer ([id number]) on 01/08/2024 1:10:32 AM  Radiology: [facility] Chest Port 1 View if patient is in a treatment room. Result Date: 01/08/2024 EXAM: 1 VIEW(S) XRAY OF THE CHEST 01/08/2024 12:57:20 AM COMPARISON: Comparison with 10/31/2023. CLINICAL HISTORY: Suspected Sepsis. FINDINGS: LUNGS AND PLEURA: Low lung volumes accentuate pulmonary vascularity. No focal pulmonary opacity. No pulmonary edema. No pleural effusion. No pneumothorax. HEART AND MEDIASTINUM: Low lung volumes accentuate cardiomediastinal silhouette. Sternotomy and CABG. No acute abnormality of the cardiac and mediastinal silhouettes. BONES AND SOFT TISSUES: Sternotomy. No acute osseous abnormality. IMPRESSION: 1. No acute findings. Electronically signed by: [name] Gatlin MD 01/08/2024 01:03 AM EDT RP Workstation: [id number]     .Joint Aspiration/Arthrocentesis  Date/Time: 01/08/2024 1:48 AM  Performed by: Bari Pfeiffer FALCON, MD Authorized by: Bari Pfeiffer FALCON, MD   Consent:    Consent obtained:  Written   Consent given by:  Spouse   Risks, benefits, and alternatives were discussed: yes     Risks discussed:  Bleeding and infection Location:    Location:  Knee   Knee:  R knee Anesthesia:    Anesthesia method:  Local infiltration   Local anesthetic:  Lidocaine  1% WITH epi Procedure details:     Preparation: Patient was prepped and draped in usual sterile fashion     Needle gauge:  18 G   Ultrasound guidance: yes     Approach:  Medial   Aspirate amount:  10   Aspirate characteristics:  Clear and yellow   Steroid injected: no     Specimen collected: yes   Post-procedure details:    Dressing:  Adhesive bandage   Procedure completion:  Tolerated .Critical Care  Performed by: Bari Pfeiffer FALCON, MD Authorized by: Bari Pfeiffer FALCON, MD   Critical care provider statement:    Critical care time (minutes):  35   Critical care was time spent personally by me on the following activities:  Development of treatment plan with patient or surrogate, discussions with consultants, evaluation of patient's response to treatment, examination of patient, ordering and review of laboratory studies, ordering and review of radiographic studies, ordering and performing treatments and interventions, pulse oximetry, re-evaluation of patient's condition and review of old charts    Medications Ordered in the ED  lidocaine -EPINEPHrine  (XYLOCAINE  W/EPI) 2 %-1:200000 (PF) injection 20 mL (has no administration in time range)  cefTRIAXone  (ROCEPHIN ) 2 g in sodium chloride  0.9 % 100 mL IVPB (has no administration in time range)  vancomycin  (VANCOCIN ) IVPB 1000 mg/200 mL premix (has no administration in time range)  lactated ringers  bolus 1,000 mL (0 mLs Intravenous Stopped 01/08/24 0212)  acetaminophen  (TYLENOL ) tablet 1,000 mg (1,000 mg Oral Given 01/08/24 0117)  ondansetron  (ZOFRAN ) injection 4 mg (4 mg Intravenous Given 01/08/24 0257)  fentaNYL  (SUBLIMAZE ) injection 50 mcg (50 mcg Intravenous Given 01/08/24 0257)    Clinical Course as of 01/08/24 0540  Thu Jan 08, 2024  0540 Patient has been signed out to the hospitalist.  At signout, noted to have BPs that had dropped.  Requested manual.  88/44 is manual BP.  Ordered an additional 250 cc fluid bolus as patient has end-stage renal disease.  Will reassess. [CH]     Clinical Course User  Index [CH] Mishal Probert, Charmaine FALCON, MD                                 Medical Decision Making Amount and/or Complexity of Data Reviewed Labs: ordered. Radiology: ordered.  Risk OTC drugs. Prescription drug management. Decision regarding hospitalization.   This patient presents to the ED for concern of knee pain, fever, altered mental status, this involves an extensive number of treatment options, and is a complaint that carries with it a high risk of complications and morbidity.  I considered the following differential and admission for this acute, potentially life threatening condition.  The differential diagnosis includes sepsis, acute infection such as septic joint, pneumonia, UTI, acute encephalopathy related to infection or end-stage renal disease  MDM:    This is a 73 year old male who presents with altered mental status.  Found to be febrile.  Also reports right knee pain.  No other physical complaints.  Right knee is warm to touch.  Arthrocentesis was performed.  Labs obtained.  He does have a leukocytosis.  Lactate marginally elevated at 2.0 and 2.2.  No evidence of shock.  Patient was given a liter of fluids and tolerated this.  He is due for dialysis later today.  Slightly elevated K which is likely related to need for dialysis.  Chest x-ray without pneumothorax pneumonia.  COVID and flu testing negative.  Patient without abdominal complaints and has a benign abdominal exam.  Low suspicion for SBP.  Preliminary arthrocentesis results without evidence of bacteria and minimal white cells.  At this time, this does not appear to be the source.  However, patient certainly meets SIRS criteria.  For this reason we will cover with broad-spectrum antibiotics with vancomycin  and ceftriaxone  and plan for admission to the hospitalist.  Noncontrasted CT scan of chest and abdomen has been added to rule out occult pneumonia or other infectious etiology.  He will need dialysis  later today.  (Labs, imaging, consults)  Labs: I Ordered, and personally interpreted labs.  The pertinent results include: CBC, BMP, blood cultures, lactate, arthrocentesis panel  Imaging Studies ordered: I ordered imaging studies including chest x-ray I independently visualized and interpreted imaging. I agree with the radiologist interpretation  Additional history obtained from chart review.  External records from outside source obtained and reviewed including prior evaluations  Cardiac Monitoring: .The patient was maintained on a cardiac monitor.  If on the cardiac monitor, I personally viewed and interpreted the cardiac monitored which showed an underlying rhythm of: Sinus  Reevaluation: After the interventions noted above, I reevaluated the patient and found that they have :stayed the same  Social Determinants of Health: . disabled  Disposition: Admit  Co morbidities that complicate the patient evaluation . Past Medical History:  Diagnosis Date  . Acute osteomyelitis of metatarsal bone of left foot (HCC)   . Ambulates with cane    straight cane  . Anemia   . Cervical myelopathy (HCC) 02/06/2018  . Chronic kidney disease    dailysis M W F- home  . Community acquired pneumonia of left lower lobe of lung 08/18/2021  . Complication of anesthesia   . Coronary artery disease   . Dehiscence of amputation stump of left lower extremity (HCC)   . Diabetes mellitus without complication (HCC)    type2  . Diabetic foot ulcer (HCC)   . Diabetic neuropathy (HCC) 02/06/2018  . Dysrhythmia   . Gait abnormality 08/24/2018  .  GERD (gastroesophageal reflux disease)     01/06/20- not current  . Gout   . History of blood transfusion   . History of blood transfusion   . History of kidney stones    passed stones  . Hypercholesteremia   . Hypertension   . Hypothyroidism   . Neuromuscular disorder (HCC)    neuropathy left leg and bilateral feet  . Neuropathy   . 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 PONV (postoperative nausea and vomiting)   . Prostate cancer (HCC)   . PVD (peripheral vascular disease)    with amputations     Medicines Meds ordered this encounter  Medications  . lactated ringers  bolus 1,000 mL  . acetaminophen  (TYLENOL ) tablet 1,000 mg  . lidocaine -EPINEPHrine  (XYLOCAINE  W/EPI) 2 %-1:200000 (PF) injection 20 mL  . ondansetron  (ZOFRAN ) injection 4 mg  . fentaNYL  (SUBLIMAZE ) injection 50 mcg  . cefTRIAXone  (ROCEPHIN ) 2 g in sodium chloride  0.9 % 100 mL IVPB    Antibiotic Indication::   Cellulitis  . vancomycin  (VANCOCIN ) IVPB 1000 mg/200 mL premix    Indication::   Cellulitis    I have reviewed the patients home medicines and have made adjustments as needed  Problem List / ED Course: Problem List Items Addressed This Visit   None Visit Diagnoses       Fever, unspecified fever cause    -  Primary     Acute pain of right knee         SIRS (systemic inflammatory response syndrome) (HCC)         ESRD (end stage renal disease) (HCC)                    Final diagnoses:  Fever, unspecified fever cause  Acute pain of right knee  SIRS (systemic inflammatory response syndrome) (HCC)  ESRD (end stage renal disease) [facility])    ED Discharge Orders     None          Bari, Charmaine FALCON, MD 01/08/24 [phone]

## 2024-01-08 NOTE — Assessment & Plan Note (Signed)
 Baseline ESRD on HD TTS  Due for HD today  Will consult nephrology

## 2024-01-08 NOTE — Assessment & Plan Note (Addendum)
 SBP in the 80s despite fluid bolus in setting of SIRS  Suspect multifactorial w/ contribution of SIRS, medication, ESRD, cirrhosis w/ third spacing, dCHF  Holding offending agents  Start midodrine  Will also consult PCCM given comorbidities  Judicious IVF given baseline volume status  Follow closely

## 2024-01-08 NOTE — Consult Note (Signed)
 Palliative Care Consult Note                                  Date: 01/08/2024   Patient Name: [name]  DOB: 04/27/1950  MRN: [id number]  Age / Sex: 73 y.o., male  PCP: [name], NP Referring Physician: Eldonna Elspeth PARAS, MD  Reason for Consultation: Establishing goals of care  HPI/Patient Profile: 73 y.o. male  with past medical history of ESRD on HD, permanent atrial fibrillation not on [facility] [address] due to GI bleed, chronic anemia, CAD status post CABG in 2005, DMT2, PAD status post bilateral BKA, and recurrent ascites requiring frequent paracentesis who presented to the ED on 01/08/2024 with altered mental status and knee pain.  He is admitted with SIRS, hypotension, encephalopathy, ascites, and right knee pain.  Right knee synovial fluid obtained in the ED showed 3 WBCs and no crystals.  Palliative Medicine has been consulted for goals of care discussions and complex medical decision making.   Clinical Assessment and Goals of Care:   Extensive chart review has been completed including labs, vital signs, imaging, progress/consult notes, orders, medications and available advance directive documents.  Note previous lab results from 10/2022 showing high SAAG (1.8) and high protein (4.1).   Patient is admitted with several issues including hypotension (posing threat to life and/or bodily function), suspected to be multifactorial in the setting of SIRS, ESRD, and ascites with third spacing.  I met with patient at bedside to discuss diagnosis, prognosis, and GOC.  He is alert and oriented; able to participate in GOC discussion.   I introduced Palliative Medicine as specialized medical care for people living with serious illness. It focuses on providing relief from the symptoms and stress of a serious illness.   Created space and opportunity for patient to express thoughts and feelings regarding current medical situation. Values and goals of  care were attempted to be elicited.  Life Review: Patient is originally from [place] .  He relocated to [place]  and then to [place]  for work, in [employer].  He lives at home with his wife Candis.  They have 2 sons  Functional Status: Patient is ambulatory with bilateral LE prosthetics.   Discussion: Patient has recently arrived to 3E, after spending 12+ hours in the ED. He expresses frustration with being in the hospital. He also reports severe right knee pain, and is wincing and visibly uncomfortable during my visit. Per MAR, he received 0.5 mg of IV dilaudid  at 13:08 and reports it gave him some relief. I have modified the order to 0.5-1 mg every 4 hours as needed, and notified his RN to give him another 0.5 mg of dilaudid  as soon as possible.   We discussed patient's current illness and what it means in the larger context of his ongoing co-morbidities. Patient has a good understanding of his current medical situation. Current clinical status was reviewed. Discussed that workup so far suggests his right knee is not infected.  Also discussed that patient was previously evaluated by [facility] and that workup was not consistent with liver disease, and that ascites could possibly be related to heart failure.  Echocardiogram pending.   At this time patient wants to continue current medical interventions and diagnostic work-up, and will make decisions based off of that information.  Discussed the importance of continued conversation with the medical team regarding overall plan of care and treatment  options.    Review of Systems  Musculoskeletal:        Knee pain    Objective:   Primary Diagnoses: Present on Admission:  SIRS (systemic inflammatory response syndrome) (HCC)  Ascites  Permanent atrial fibrillation (HCC)  Anemia of chronic disease  Essential hypertension  Gastroesophageal reflux disease without esophagitis  Pressure injury of skin  Encephalopathy   Thrombocytopenia  CAD (coronary artery disease)   Physical Exam Vitals reviewed.  Constitutional:      General: He is not in acute distress.    Comments: Chronically ill-appearing  Pulmonary:     Effort: Pulmonary effort is normal.  Musculoskeletal:     Right Lower Extremity: Right leg is amputated below knee.     Left Lower Extremity: Left leg is amputated below knee.  Neurological:     Mental Status: He is alert and oriented to person, place, and time.     Palliative Assessment/Data: PPS 60%     Assessment & Plan:   SUMMARY OF RECOMMENDATIONS   Continue current scope of care Increased Dilaudid  to 0.5-1 mg IV every 4 hours as needed Ongoing palliative support  Primary Decision Maker: PATIENT  Existing Vynca/ACP Documentation: None  Code Status/Advance Care Planning: Full code - code status was not discussed today due to patient's pain as well as frustration from being in the ED  Prognosis:  Unable to determine  Discharge Planning:  To Be Determined    Thank you for allowing us  to participate in the care of [name]  Billing based on MDM: High  Problems Addressed: One acute or chronic illness or injury that poses a threat to life or bodily function  Amount and/or Complexity of Data: Category 1:Review of prior external note(s) from each unique source and Review of the result(s) of each unique test and Category 2:Independent interpretation of a test performed by another physician/other qualified health care professional (not separately reported)  Risks: Parenteral controlled substances    Signed by: Recardo Loll, NP Palliative Medicine Team  Team Phone # [phone]  For individual providers, please see AMION

## 2024-01-08 NOTE — Progress Notes (Signed)
 Pt arrived on floor, tele box, alert and oriented . Pt declines O2, has been satting around 95, but has cold hands, could only get sat on earlobe and that moves around, showing lower sats, patient remains alert.

## 2024-01-08 NOTE — ED Notes (Signed)
 CCMD called by this RN

## 2024-01-08 NOTE — Assessment & Plan Note (Signed)
 2D ECHO 11/2022 with EF 60-65%, grade 1 mitral regurgitation, moderate TR, mild PH + volume overload and hypotension on presentation in setting of SIRS, ESRD, recurrent ascites  Will check 2D ECHO to correlate  Consult cardiology as clinically appropriate

## 2024-01-08 NOTE — Assessment & Plan Note (Signed)
 SBP in 80s at present  Hold BP regimen  Monitor

## 2024-01-08 NOTE — Assessment & Plan Note (Signed)
 Skin check pending to assess for any potential soft tissue source for SIRS

## 2024-01-08 NOTE — Consult Note (Signed)
 Renal Service Consult Note [facility] [name], MD  Patient: [name] Date: 01/08/2024 Requesting Physician: Dr. Eldonna  Reason for Consult: ESRD patient with altered mental status HPI: The patient is a 73 y.o. year-old w/ PMH as below who presented to ED today due to altered mental status could not say the day or the month.  In ED temp was 100 degrees, RR 26, BP 106/46, HR 64.  97% on room air. Patient's right knee was swollen and red hot.  An aspiration was done by the ED team.  White count was up, lactic acid borderline at 2.0.  No evidence of shock.  Patient received 1 L IV fluids.  Chest x-ray negative.  Patient is to be admitted for possible sepsis.  Noncontrast CT of the chest and abdomen is pending.  We are asked to see for dialysis   Pt seen in ED room.  Wife is at bedside.  [name] has been out of the hospital for good while which is good.  His last dialysis was Tuesday, his dialysis schedule is TTS.  He gets recurrent fluid collection in his belly and he has that drained off on a boat on a regular basis.  His right knee has been painful.  He has a fistula in his left arm.  Not requiring any oxygen , no complaints of shortness of breath.   ROS - denies CP, no joint pain, no HA, no blurry vision, no rash, no diarrhea, no nausea/ vomiting   Past Medical History  Past Medical History:  Diagnosis Date   Acute osteomyelitis of metatarsal bone of left foot (HCC)    Ambulates with cane    straight cane   Anemia    Cervical myelopathy (HCC) 02/06/2018   Chronic kidney disease    dailysis [facility]- home   Community acquired pneumonia of left lower lobe of lung 08/18/2021   Complication of anesthesia    Coronary artery disease    Dehiscence of amputation stump of left lower extremity (HCC)    Diabetes mellitus without complication (HCC)    type2   Diabetic foot ulcer (HCC)    Diabetic neuropathy (HCC) 02/06/2018   Dysrhythmia    Gait abnormality  08/24/2018   GERD (gastroesophageal reflux disease)     01/06/20- not current   Gout    History of blood transfusion    History of blood transfusion    History of kidney stones    passed stones   Hypercholesteremia    Hypertension    Hypothyroidism    Neuromuscular disorder (HCC)    neuropathy left leg and bilateral feet   Neuropathy    Partial nontraumatic amputation of foot, 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   PONV (postoperative nausea and vomiting)    Prostate cancer (HCC)    PVD (peripheral vascular disease)    with amputations   Past Surgical History  Past Surgical History:  Procedure Laterality Date   A-FLUTTER ABLATION N/A 04/06/2020   Procedure: A-FLUTTER ABLATION;  Surgeon: Waddell Danelle ORN, MD;  Location: [facility] INVASIVE CV LAB;  Service: Cardiovascular;  Laterality: N/A;   A/V FISTULAGRAM N/A 03/05/2023   Procedure: A/V Fistulagram;  Surgeon: Pearline [name] RAMAN, MD;  Location: [facility] INVASIVE CV LAB;  Service: Vascular;  Laterality: N/A;   A/V SHUNT INTERVENTION Left 11/07/2023   Procedure: A/V SHUNT INTERVENTION;  Surgeon: Gretta Lonni PARAS, MD;  Location: [facility] PV LAB;  Service: Cardiovascular;  Laterality: Left;   AMPUTATION Left 12/25/2013  Procedure: AMPUTATION RAY LEFT 5TH RAY;  Surgeon: Jerona Harden GAILS, MD;  Location: [facility] ORS;  Service: Orthopedics;  Laterality: Left;   AMPUTATION Right 12/15/2018   Procedure: AMPUTATION OF 4TH AND 5TH TOES RIGHT FOOT;  Surgeon: Harden Jerona GAILS, MD;  Location: [facility] OR;  Service: Orthopedics;  Laterality: Right;   AMPUTATION Left 02/02/2021   Procedure: LEFT FOOT 4TH RAY AMPUTATION;  Surgeon: Harden Jerona GAILS, MD;  Location: [facility] OR;  Service: Orthopedics;  Laterality: Left;   AMPUTATION Left 05/09/2021   Procedure: LEFT BELOW KNEE AMPUTATION;  Surgeon: Harden Jerona GAILS, MD;  Location: [facility] OR;  Service: Orthopedics;  Laterality: Left;   AMPUTATION Right 07/26/2022   Procedure: RIGHT BELOW KNEE AMPUTATION;  Surgeon: Harden Jerona GAILS, MD;  Location: [facility] OR;  Service:  Orthopedics;  Laterality: Right;   APPLICATION OF WOUND VAC Right 12/15/2018   Procedure: APPLICATION OF WOUND VAC;  Surgeon: Harden Jerona GAILS, MD;  Location: [facility] OR;  Service: Orthopedics;  Laterality: Right;   AV FISTULA PLACEMENT Left 03/25/2019   Procedure: LEFT ARM ARTERIOVENOUS (AV) FISTULA CREATION;  Surgeon: Serene Gaile ORN, MD;  Location: [facility] OR;  Service: Vascular;  Laterality: Left;   BACK SURGERY     BASCILIC VEIN TRANSPOSITION Left 05/20/2019   Procedure: SECOND STAGE LEFT BASCILIC VEIN TRANSPOSITION;  Surgeon: Serene Gaile ORN, MD;  Location: [facility] OR;  Service: Vascular;  Laterality: Left;   BIOPSY  06/22/2022   Procedure: BIOPSY;  Surgeon: Wilhelmenia Aloha Raddle., MD;  Location: [facility] ENDOSCOPY;  Service: Gastroenterology;;   CARDIAC CATHETERIZATION  02/17/2014   CATARACT EXTRACTION Bilateral    CHOLECYSTECTOMY     COLONOSCOPY  2011   in [place], Normal   COLONOSCOPY N/A 06/23/2022   Procedure: COLONOSCOPY;  Surgeon: Legrand Victory LITTIE DOUGLAS, MD;  Location: [facility] ENDOSCOPY;  Service: Gastroenterology;  Laterality: N/A;   CORONARY ARTERY BYPASS GRAFT  2008   CYSTOSCOPY N/A 06/29/2020   Procedure: CYSTOSCOPY WITH CLOT EVACUATION AND  FULGERATION;  Surgeon: Matilda Senior, MD;  Location: [facility] OR;  Service: Urology;  Laterality: N/A;   CYSTOSCOPY WITH FULGERATION Bilateral 06/17/2020   Procedure: CYSTOSCOPY,BILATERAL RETROGRADE, CLOT EVACUATION WITH FULGERATION OF THE BLADDER;  Surgeon: Selma Donnice SAUNDERS, MD;  Location: [facility] OR;  Service: Urology;  Laterality: Bilateral;   CYSTOSCOPY WITH FULGERATION N/A 06/28/2020   Procedure: CYSTOSCOPY WITH CLOT EVACUATION AND FULGERATION OF BLEEDERS;  Surgeon: Matilda Senior, MD;  Location: [facility] OR;  Service: Urology;  Laterality: N/A;  1 HR   ENTEROSCOPY N/A 06/22/2022   Procedure: ENTEROSCOPY;  Surgeon: Wilhelmenia Aloha Raddle., MD;  Location: [name] ENDOSCOPY;  Service: Gastroenterology;  Laterality: N/A;   HEMOSTASIS CLIP PLACEMENT  06/22/2022   Procedure:  HEMOSTASIS CLIP PLACEMENT;  Surgeon: Wilhelmenia Aloha Raddle., MD;  Location: [facility] ENDOSCOPY;  Service: Gastroenterology;;   HOT HEMOSTASIS N/A 06/22/2022   Procedure: HOT HEMOSTASIS (ARGON PLASMA COAGULATION/BICAP);  Surgeon: Wilhelmenia Aloha Raddle., MD;  Location: [facility] ENDOSCOPY;  Service: Gastroenterology;  Laterality: N/A;   I & D EXTREMITY Right 12/15/2018   Procedure: DEBRIDEMENT RIGHT FOOT;  Surgeon: Harden Jerona GAILS, MD;  Location: [facility] OR;  Service: Orthopedics;  Laterality: Right;   I & D EXTREMITY Right 08/27/2019   Procedure: PARTIAL CUBOID EXCISION RIGHT FOOT;  Surgeon: Harden Jerona GAILS, MD;  Location: [facility] OR;  Service: Orthopedics;  Laterality: Right;   I & D EXTREMITY Right 01/07/2020   Procedure: RIGHT FOOT EXCISION INFECTED BONE;  Surgeon: Harden Jerona GAILS, MD;  Location: [name] OR;  Service: Orthopedics;  Laterality: Right;   I & D EXTREMITY Right 05/17/2022   Procedure: IRRIGATION AND DEBRIDEMENT RIGHT FOOT ABSCESS;  Surgeon: Harden Jerona GAILS, MD;  Location: [facility] OR;  Service: Orthopedics;  Laterality: Right;   IR PARACENTESIS  10/11/2022   IR PARACENTESIS  12/24/2022   IR PARACENTESIS  02/06/2023   IR PARACENTESIS  05/05/2023   IR PARACENTESIS  05/19/2023   IR PARACENTESIS  07/11/2023   IR PARACENTESIS  07/30/2023   IR PARACENTESIS  08/20/2023   IR PARACENTESIS  09/19/2023   IR PARACENTESIS  10/03/2023   IR PARACENTESIS  10/17/2023   IR PARACENTESIS  10/30/2023   IR PARACENTESIS  11/19/2023   IR PARACENTESIS  12/03/2023   IR PARACENTESIS  12/16/2023   NECK SURGERY     novemver 2019   PERIPHERAL VASCULAR BALLOON ANGIOPLASTY Left 03/05/2023   Procedure: PERIPHERAL VASCULAR BALLOON ANGIOPLASTY;  Surgeon: Pearline [name] RAMAN, MD;  Location: [facility] INVASIVE CV LAB;  Service: Vascular;  Laterality: Left;  AVF   POLYPECTOMY  06/22/2022   Procedure: POLYPECTOMY;  Surgeon: Mansouraty, Aloha Raddle., MD;  Location: [facility] ENDOSCOPY;  Service: Gastroenterology;;   POLYPECTOMY  06/23/2022   Procedure: POLYPECTOMY;   Surgeon: Legrand Victory LITTIE DOUGLAS, MD;  Location: [facility] ENDOSCOPY;  Service: Gastroenterology;;   STUMP REVISION Left 06/27/2021   Procedure: REVISION LEFT BELOW KNEE AMPUTATION;  Surgeon: Harden Jerona GAILS, MD;  Location: [facility] OR;  Service: Orthopedics;  Laterality: Left;   SUBMUCOSAL LIFTING INJECTION  06/22/2022   Procedure: SUBMUCOSAL LIFTING INJECTION;  Surgeon: Wilhelmenia Aloha Raddle., MD;  Location: [facility] ENDOSCOPY;  Service: Gastroenterology;;   SUBMUCOSAL TATTOO INJECTION  06/22/2022   Procedure: SUBMUCOSAL TATTOO INJECTION;  Surgeon: Wilhelmenia Aloha Raddle., MD;  Location: [facility] ENDOSCOPY;  Service: Gastroenterology;;   TRANSURETHRAL RESECTION OF PROSTATE N/A 06/28/2020   Procedure: TRANSURETHRAL RESECTION OF THE PROSTATE (TURP);  Surgeon: Matilda Senior, MD;  Location: [facility] OR;  Service: Urology;  Laterality: N/A;   VENOUS ANGIOPLASTY  11/07/2023   Procedure: VENOUS ANGIOPLASTY;  Surgeon: Gretta Lonni PARAS, MD;  Location: [facility] PV LAB;  Service: Cardiovascular;;  Basilic Vein, Subclavian Vein   WISDOM TOOTH EXTRACTION     Family History  Family History  Problem Relation Age of Onset   Diabetes Mellitus II Mother    Kidney disease Mother    Diabetes Mellitus II Father    CAD Father    Cancer Father        prostate   Kidney disease Father    Diabetes Mellitus II Brother    Kidney disease Brother    Diabetes Mellitus II Brother    Stomach cancer Brother 40   Kidney disease Brother    Colon cancer Neg Hx    Colon polyps Neg Hx    Esophageal cancer Neg Hx    Rectal cancer Neg Hx    Pancreatic cancer Neg Hx    Social History  reports that he quit smoking about 35 years ago. His smoking use included cigars. He started smoking about 50 years ago. He has never used smokeless tobacco. He reports that he does not drink alcohol  and does not use drugs. Allergies  Allergies  Allergen Reactions   Morphine  Other (See Comments)    Other reaction(s): Delusions (intolerance)  Other Reaction(s):  delusions   Mushroom Extract Complex (Obsolete) Nausea Only   Home medications Prior to Admission medications   Medication Sig Start Date End Date Taking? Authorizing Provider  allopurinol  (ZYLOPRIM ) 100 MG tablet Take 2 tablets (200 mg total) by mouth  daily. 08/08/22  Yes Angiulli, Toribio PARAS, PA-C  ibuprofen  (ADVIL ) 200 MG tablet Take 600 mg by mouth every 6 (six) hours as needed for fever, headache or mild pain (pain score 1-3).   Yes [provider]  levothyroxine  (SYNTHROID ) 112 MCG tablet Take 112 mcg by mouth daily before breakfast.   Yes [provider]  modafinil  (PROVIGIL ) 200 MG tablet Take 1 tablet (200 mg total) by mouth daily. 12/22/23  Yes Raulkar, Sven SQUIBB, MD  pantoprazole  (PROTONIX ) 40 MG tablet Take 1 tablet (40 mg total) by mouth daily. 12/19/23  Yes [name], NP  pregabalin  (LYRICA ) 150 MG capsule Take 1 capsule (150 mg total) by mouth 4 (four) times daily. Patient taking differently: Take 150 mg by mouth 2 (two) times daily. 12/22/23  Yes Raulkar, Sven SQUIBB, MD  propranolol  (INDERAL ) 40 MG tablet Take 40 mg by mouth daily. 03/24/23  Yes [provider]  rosuvastatin  (CRESTOR ) 10 MG tablet Take 1 tablet (10 mg total) by mouth daily. 12/19/23 03/18/24 Yes [name], NP  sevelamer  carbonate (RENVELA ) 800 MG tablet Take 800 mg by mouth 3 (three) times daily. 01/17/23  Yes [provider]  spironolactone (ALDACTONE) 25 MG tablet Take 25 mg by mouth daily. 12/14/23  Yes [provider]  torsemide  (DEMADEX ) 20 MG tablet Take 1 tablet (20 mg total) by mouth 2 (two) times daily. Patient taking differently: Take 20 mg by mouth daily. 06/16/23  Yes Ladona Heinz, MD  tiZANidine  (ZANAFLEX ) 2 MG tablet Take 2 mg by mouth 2 (two) times daily as needed. Patient not taking: Reported on 01/08/2024 12/14/23   [provider]     Vitals:   01/08/24 1200 01/08/24 1230 01/08/24 1300 01/08/24 1330  BP: (!) 105/54 (!) 109/59 (!)  135/48 (!) 125/48  Pulse: 61 98 64 (!) 58  Resp: 18 (!) 22 20 18   Temp:    97.8 F (36.6 C)  TempSrc:    Oral  SpO2: 99% 99% 99% 99%  Weight:      Height:       Exam Gen alert, no distress, chronically ill Sclera anicteric, throat clear  No jvd or bruits Chest clear bilat to bases RRR no MRG Abd soft ntnd no mass or ascites +bs Ext bilateral BKA, 1+hip edema, no other edema Neuro is alert, Ox 3 , nf    LUA AVF+bruit   Home bp meds: Propranolol  40mg  every day Aldactone 25 every day Torsemide  20mg  bid (taking once daily)   OP HD: TTS [facility] From July 2025 --> 3h  B 400  87kg  2K bath  AVF  Heparin  none   Assessment/ Plan: SIRS: rodgers GRIFFES, ^WBC, LA 2.2.  R knee red and swollen, aspirated in ED. IV abx started. CT c/a/p is pending. Per pmd.  AMS: soft bp's, got IVF x 1 L, midodrine started for BP support. IV abx as above.  ESRD: on HD TTS. Has not missed. Plan HD today/ tonight.  BP: BP's 105/ 45 range, midodrine started by pmd. Follow.  Volume: usually above dry wt, get records and bed wt when in a room. UF 2-2.5 L as tol.  Anemia of esrd: Hb 10-12 here, no esa needs. Will follow.      Myer Fret  MD [facility] 01/08/2024, 2:17 PM  Recent Labs  Lab 01/08/24 0046 01/08/24 0541  HGB 12.0* 10.5*  ALBUMIN  2.3* 1.7*  CALCIUM  7.3* 6.6*  PHOS  --  5.2*  CREATININE 6.73* 6.47*  K 5.7* 4.6   Inpatient medications:  allopurinol   200 mg Oral Daily   levothyroxine   112 mcg Oral Q0600   midodrine  10 mg Oral TID WC   modafinil   200 mg Oral Daily   pantoprazole   40 mg Oral Daily   rosuvastatin   10 mg Oral Daily   sevelamer  carbonate  800 mg Oral TID WC    ceFEPime  (MAXIPIME ) IV     metronidazole  500 mg (01/08/24 1352)   acetaminophen  **OR** acetaminophen , HYDROmorphone  (DILAUDID ) injection, ondansetron  (ZOFRAN ) IV

## 2024-01-08 NOTE — Assessment & Plan Note (Signed)
 Hgb 12 today  Near baseline  No overt signs of bleeding at present  Monitor

## 2024-01-08 NOTE — Assessment & Plan Note (Signed)
 Mild generalized lethargy and confusion on presentation Toxic metabolic Suspect multifactorial with contributions of SIRS, ESRD, hypotension BUN 58 Ammonia level pending Nonfocal neuroexam on evaluation Monitor mentation with treatment

## 2024-01-09 DIAGNOSIS — R6510 Systemic inflammatory response syndrome (SIRS) of non-infectious origin without acute organ dysfunction: Secondary | ICD-10-CM | POA: Diagnosis not present

## 2024-01-09 LAB — GLUCOSE, CAPILLARY
Glucose-Capillary: 101 mg/dL — ABNORMAL HIGH (ref 70–99)
Glucose-Capillary: 84 mg/dL (ref 70–99)
Glucose-Capillary: 117 mg/dL — ABNORMAL HIGH (ref 70–99)
Glucose-Capillary: 86 mg/dL (ref 70–99)

## 2024-01-09 LAB — URIC ACID: Uric Acid, Serum: 4.2 mg/dL (ref 3.7–8.6)

## 2024-01-09 LAB — PROTEIN, BODY FLUID (OTHER): Total Protein, Body Fluid Other: 0.5 g/dL

## 2024-01-09 MED ORDER — HYDROCODONE-ACETAMINOPHEN 7.5-325 MG PO TABS
1.0000 | ORAL_TABLET | Freq: Four times a day (QID) | ORAL | Status: AC | PRN
  Administered 2024-01-09 – 2024-01-12 (×3): 1 via ORAL
  Filled 2024-01-09 (×3): qty 1

## 2024-01-09 MED ORDER — LIDOCAINE 5 % EX PTCH
1.0000 | MEDICATED_PATCH | CUTANEOUS | Status: DC
Start: 2024-01-09 — End: 2024-01-15
  Administered 2024-01-09 – 2024-01-13 (×5): 1 via TRANSDERMAL
  Filled 2024-01-09 (×7): qty 1

## 2024-01-09 MED ORDER — CHLORHEXIDINE GLUCONATE CLOTH 2 % EX PADS
6.0000 | MEDICATED_PAD | Freq: Every day | CUTANEOUS | Status: DC
  Administered 2024-01-10 – 2024-01-15 (×6): 6 via TOPICAL

## 2024-01-09 NOTE — Plan of Care (Signed)
   Problem: Respiratory: Goal: Ability to maintain adequate ventilation will improve Outcome: Progressing

## 2024-01-09 NOTE — Progress Notes (Signed)
 [facility] Progress Note  Subjective:  HD last night w/ 2 L off BP's were good during HD No c/o today  Vitals:   01/08/24 2347 01/09/24 0321 01/09/24 0805 01/09/24 1000  BP: (!) 130/56 (!) 120/49 (!) 115/46   Pulse: 82 84 69   Resp: 18 (!) 21 18   Temp: 98.3 F (36.8 C) 99.3 F (37.4 C) 98 F (36.7 C)   TempSrc: Oral Oral Oral   SpO2: 93% 100% 100% 93%  Weight:  88.3 kg    Height:        Exam: Gen alert, chronically ill Sclera anicteric, throat clear  No jvd or bruits Chest clear bilat to bases RRR no MRG Abd soft ntnd no mass or ascites +bs Ext bilateral BKA, bilat 1+hip edema Neuro is alert, Ox 2 , nf    LUA AVF+bruit    Home bp meds: Propranolol  40mg  every day Aldactone 25 every day Torsemide  20mg  bid (taking once daily)    OP HD: TTS [facility] from July 2025 --> 3h  B 400  87kg  2K bath  AVF  Heparin  none     Assessment/ Plan: SIRS: rodgers GRIFFES, ^WBC, LA 2.2.  R knee red and swollen, aspirated in ED. IV abx started. CT c/a/p is pending. Per pmd.  AMS: soft bp's, got IVF x 1 L, midodrine started for BP support. IV abx as above.  ESRD: on HD TTS. Has not missed HD. Had HD last night, next HD tomorrow.  BP: BP's 105/ 45 range, midodrine started by pmd. Follow.  Volume: initial CXR clear, close to dry wt, no gross hypervolemia on exam. UF 2-2.5 L as tol.  Anemia of esrd: Hb 10-12 here, no esa needs. Will follow.  Recurrent ascites: possibly d/t heart failure. Prior w/u did not find liver disease. Gets LVP regularly in OP setting.     Myer Fret MD  [facility] 01/09/2024, 11:25 AM  Recent Labs  Lab 01/08/24 0046 01/08/24 0541  HGB 12.0* 10.5*  ALBUMIN  2.3* 1.7*  CALCIUM  7.3* 6.6*  PHOS  --  5.2*  CREATININE 6.73* 6.47*  K 5.7* 4.6   No results for input(s): IRON, TIBC, FERRITIN in the last 168 hours. Inpatient medications:  allopurinol   200 mg Oral Daily   Chlorhexidine  Gluconate Cloth  6 each Topical Q0600   insulin  aspart  0-6  Units Subcutaneous TID WC   levothyroxine   112 mcg Oral Q0600   lidocaine   1 patch Transdermal Q24H   midodrine  10 mg Oral TID WC   modafinil   200 mg Oral Daily   pantoprazole   40 mg Oral Daily   rosuvastatin   10 mg Oral Daily   sevelamer  carbonate  800 mg Oral TID WC    ceFEPime  (MAXIPIME ) IV 2 g (01/08/24 2252)   metronidazole  500 mg (01/09/24 1027)   vancomycin  1,000 mg (01/09/24 0037)   acetaminophen  **OR** acetaminophen , HYDROmorphone  (DILAUDID ) injection, ondansetron  (ZOFRAN ) IV

## 2024-01-09 NOTE — Progress Notes (Signed)
 PROGRESS NOTE    [name]  FMW:[id number] DOB: 1950/04/11 DOA: 01/08/2024 PCP: [name], NP  Outpatient Specialists:     Brief Narrative:  Patient is a 73 year old male with multiple medical and cardiac history.  Patient carries diagnosis of gout, hypertension, diabetes mellitus, prior history of CABG and ESRD on hemodialysis TTS.  Patient is s/p bilateral BKA.  Patient was admitted with SIRS, encephalopathy, right knee pain with associated redness of the skin.  Patient underwent arthroscopy of the right knee at the emergency room and analysis was negative for crystals, with WBC of 3.  Patient is currently on IV vancomycin , Flagyl  and cefepime .  Cultures are pending.  Will check uric acid level.  Will hold off on colchicine for now.  01/09/2024: Patient was seen alongside patient's nurse.  All questions answered.  Cultures are pending.  Follow uric acid level.  Follow cultures.  Low threshold to hold allopurinol  i.e. if acute gout flare is confirmed.   Assessment & Plan:   Principal Problem:   SIRS (systemic inflammatory response syndrome) (HCC) Active Problems:   Essential hypertension   CAD (coronary artery disease)   Insulin  dependent type 2 diabetes mellitus (HCC)   ESRD on dialysis (HCC)   Pressure injury of skin   Anemia of chronic disease   Permanent atrial fibrillation (HCC)   Thrombocytopenia   Encephalopathy   Ascites   Gastroesophageal reflux disease without esophagitis   Hypotension   (HFpEF) heart failure with preserved ejection fraction (HCC)   Right knee pain    SIRS (systemic inflammatory response syndrome) (HCC) -Meeting borderline SIRS criteria with noted Tmax 100.1, respiration in the 20s, white count of near 12 -Confounding issues include ESRD as well as systolic pressures in the 80s Lactate of 2-2.2 -Noted right knee swelling on initial presentation with fairly normal fluid analysis -Chest x-ray stable -CT chest abdomen pelvis with  otherwise stable findings -Started on empiric Rocephin  and vancomycin  in the ER.  Broadened to cefepime , vanc and flagyl  by the admitting team. -Low threshold to change cefepime  to Rocephin . -Follow right knee synovial fluid analysis and final cultures. -Check uric acid level.  Hypotension SBP in the 80s despite fluid bolus in setting of SIRS  Suspect multifactorial w/ contribution of SIRS, medication, ESRD, cirrhosis w/ third spacing, dCHF  Holding offending agents  Continue midodrine 10 mg p.o. 3 times daily. Systolic blood pressure improved to 100+ millimeters mercury.   Continue to monitor closely    Encephalopathy Mild generalized lethargy and confusion on presentation Toxic metabolic Suspect multifactorial with contributions of SIRS, ESRD, hypotension BUN 58 Ammonia level pending Nonfocal neuroexam on evaluation Monitor mentation with treatment 01/09/2024: Seems to be improving.  Ammonia level of 31.  Repeat CBC.  Last WBC of 15.1.   Permanent atrial fibrillation (HCC) Rate controlled atrial fibrillation on presentation  Not an anticoagulation in setting of hx/o GU bleeding    Ascites Recurrent ascites on CT today  Plan for paracentesis as hemodynamics tolerate  Follow    Right knee pain -R knee pain and swelling on presentation in setting of SIRS and hypotension S/p arthocentesis- less suggestive for infection or crystals  -Patient is currently on IV vancomycin , cefepime  and Flagyl . - Check uric acid level. - Cellulitic changes around the right knee noted. - Patient has history of gout. - Cannot rule out acute gouty arthritis of right knee.     ESRD on dialysis [facility])  Continue hemodialysis TTS.   CAD (coronary artery disease) Baseline history  of CAD status post CABG No active chest pain EKG stable Monitor   Insulin  dependent type 2 diabetes mellitus (HCC) Blood sugar has improved. Continue to hold on SSI for now  Dextrose  infused fluids as clinically  indicated   Anemia of chronic disease Hgb 12 today  Near baseline  No overt signs of bleeding at present  Monitor    Thrombocytopenia Platelet count stable low 140s today Monitor   Essential hypertension/hypotension -Antihypertensives are on hold. - Continue to monitor closely    (HFpEF) heart failure with preserved ejection fraction (HCC) 2D ECHO 11/2022 with EF 60-65%, grade 1 mitral regurgitation, moderate TR, mild PH + volume overload and hypotension on presentation in setting of SIRS, ESRD, recurrent ascites   01/09/2024: Repeat echo revealed ejection fraction of 55 to 60%, left ventricular diastolic function could not be evaluated, interventricular septum is flattened in systole and diastole, consistent with right ventricular pressure and volume overload, dilated inferior vena cava with less than 50% respiratory variability.  Patient is on hemodialysis TTS.   Gastroesophageal reflux disease without esophagitis PPI    Pressure injury of skin Skin check pending to assess for any potential soft tissue source for SIRS   DVT prophylaxis:  Code Status: Full code Family Communication:  Disposition Plan:    Consultants:  None.  Procedures:  Arthrocentesis of left knee and fluid aspiration done by the ER provider  Antimicrobials:  IV vancomycin . IV Flagyl . IV cefepime .   Subjective: Patient continues to report right knee pain. Cellulitic changes around the right knee.  Objective: Vitals:   01/08/24 2145 01/08/24 2347 01/09/24 0321 01/09/24 0805  BP: (!) 132/52 (!) 130/56 (!) 120/49 (!) 115/46  Pulse: 88 82 84 69  Resp: 20 18 (!) 21 18  Temp: 98.3 F (36.8 C) 98.3 F (36.8 C) 99.3 F (37.4 C) 98 F (36.7 C)  TempSrc: Oral Oral Oral Oral  SpO2: 95% 93% 100% 100%  Weight:   88.3 kg   Height:        Intake/Output Summary (Last 24 hours) at 01/09/2024 1008 Last data filed at 01/09/2024 0137 Gross per 24 hour  Intake 936.45 ml  Output 2000 ml  Net  -1063.55 ml   Filed Weights   01/08/24 1718 01/08/24 2111 01/09/24 0321  Weight: 86.5 kg 84.5 kg 88.3 kg    Examination:  General exam: Appears calm and comfortable.  Bilateral below-knee amputation.  Respiratory system: Clear to auscultation. Respiratory effort normal. Cardiovascular system: S1 & S2, systolic murmur with query increased intensity of S2 component of the heart sound.   Gastrointestinal system: Abdomen is soft and nontender  Central nervous system: Alert and oriented.  Extremities: Bilateral BKA  Data Reviewed: I have personally reviewed following labs and imaging studies  CBC: Recent Labs  Lab 01/08/24 0046 01/08/24 0541  WBC 11.6* 15.1*  NEUTROABS 10.6* 13.5*  HGB 12.0* 10.5*  HCT 38.0* 34.2*  MCV 88.8 91.4  PLT 148* 141*   Basic Metabolic Panel: Recent Labs  Lab 01/08/24 0046 01/08/24 0541  NA 136 134*  K 5.7* 4.6  CL 99 96*  CO2 22 21*  GLUCOSE 76 101*  BUN 58* 55*  CREATININE 6.73* 6.47*  CALCIUM  7.3* 6.6*  MG  --  1.0*  PHOS  --  5.2*   GFR: Estimated Creatinine Clearance: 10.7 mL/min (A) (by C-G formula based on SCr of 6.47 mg/dL (H)). Liver Function Tests: Recent Labs  Lab 01/08/24 0046 01/08/24 0541  AST 22  --  ALT 12  --   ALKPHOS 165*  --   BILITOT 1.0  --   PROT 4.9*  --   ALBUMIN  2.3* 1.7*   No results for input(s): LIPASE, AMYLASE in the last 168 hours. Recent Labs  Lab 01/08/24 1025  AMMONIA 31   Coagulation Profile: Recent Labs  Lab 01/08/24 0046  INR 1.1   Cardiac Enzymes: No results for input(s): CKTOTAL, CKMB, CKMBINDEX, TROPONINI in the last 168 hours. BNP (last 3 results) Recent Labs    10/28/23 2109  PROBNP >35,000.0*   HbA1C: No results for input(s): HGBA1C in the last 72 hours. CBG: Recent Labs  Lab 01/08/24 2140 01/08/24 2214 01/08/24 2338 01/09/24 0216 01/09/24 0611  GLUCAP 62* 79 107* 101* 84   Lipid Profile: No results for input(s): CHOL, HDL, LDLCALC, TRIG,  CHOLHDL, LDLDIRECT in the last 72 hours. Thyroid  Function Tests: No results for input(s): TSH, T4TOTAL, FREET4, T3FREE, THYROIDAB in the last 72 hours. Anemia Panel: No results for input(s): VITAMINB12, FOLATE, FERRITIN, TIBC, IRON, RETICCTPCT in the last 72 hours. Urine analysis:    Component Value Date/Time   COLORURINE YELLOW 07/22/2022 2356   APPEARANCEUR CLEAR 07/22/2022 2356   LABSPEC 1.022 07/22/2022 2356   PHURINE 6.5 07/22/2022 2356   GLUCOSEU NEGATIVE 07/22/2022 2356   HGBUR SMALL (A) 07/22/2022 2356   BILIRUBINUR NEGATIVE 07/22/2022 2356   KETONESUR NEGATIVE 07/22/2022 2356   PROTEINUR >300 (A) 07/22/2022 2356   UROBILINOGEN 0.2 12/28/2014 1031   NITRITE NEGATIVE 07/22/2022 2356   LEUKOCYTESUR TRACE (A) 07/22/2022 2356   Sepsis Labs: @LABRCNTIP (procalcitonin:4,lacticidven:4)  ) Recent Results (from the past 240 hours)  Culture, blood (Routine x 2)     Status: None (Preliminary result)   Collection Time: 01/08/24 12:46 AM   Specimen: BLOOD RIGHT ARM  Result Value Ref Range Status   Specimen Description BLOOD RIGHT ARM  Final   Special Requests   Final    BOTTLES DRAWN AEROBIC AND ANAEROBIC Blood Culture adequate volume   Culture   Final    NO GROWTH 1 DAY Performed at [facility], [address] [place], [place] [address]    Report Status PENDING  Incomplete  Culture, blood (Routine x 2)     Status: None (Preliminary result)   Collection Time: 01/08/24 12:46 AM   Specimen: BLOOD RIGHT ARM  Result Value Ref Range Status   Specimen Description BLOOD RIGHT ARM  Final   Special Requests   Final    BOTTLES DRAWN AEROBIC AND ANAEROBIC Blood Culture results may not be optimal due to an inadequate volume of blood received in culture bottles   Culture   Final    NO GROWTH 1 DAY Performed at [facility], [address] [name], [place] [address]    Report Status PENDING  Incomplete  Body fluid culture w Gram Stain     Status:  None (Preliminary result)   Collection Time: 01/08/24  1:51 AM   Specimen: KNEE; Body Fluid  Result Value Ref Range Status   Specimen Description KNEE  Final   Special Requests NONE  Final   Gram Stain   Final    RARE WBC PRESENT, PREDOMINANTLY PMN NO ORGANISMS SEEN Performed at [facility], [address], [place], [place] [address]    Culture PENDING  Incomplete   Report Status PENDING  Incomplete  Resp panel by RT-PCR (RSV, Flu A&B, Covid) KNEE     Status: None   Collection Time: 01/08/24  1:51 AM   Specimen: KNEE; Nasal Swab  Result Value Ref Range Status   SARS Coronavirus 2 by RT PCR NEGATIVE NEGATIVE Final   Influenza A by PCR NEGATIVE NEGATIVE Final   Influenza B by PCR NEGATIVE NEGATIVE Final    Comment: (NOTE) The Xpert Xpress SARS-CoV-2/FLU/RSV plus assay is intended as an aid in the diagnosis of influenza from Nasopharyngeal swab specimens and should not be used as a sole basis for treatment. Nasal washings and aspirates are unacceptable for Xpert Xpress SARS-CoV-2/FLU/RSV testing.  Fact Sheet for Patients: BloggerCourse.com  Fact Sheet for Healthcare Providers: SeriousBroker.it  This test is not yet approved or cleared by the [place]  FDA and has been authorized for detection and/or diagnosis of SARS-CoV-2 by FDA under an Emergency Use Authorization (EUA). This EUA will remain in effect (meaning this test can be used) for the duration of the COVID-19 declaration under Section 564(b)(1) of the Act, 21 U.S.C. section 360bbb-3(b)(1), unless the authorization is terminated or revoked.     Resp Syncytial Virus by PCR NEGATIVE NEGATIVE Final    Comment: (NOTE) Fact Sheet for Patients: BloggerCourse.com  Fact Sheet for Healthcare Providers: SeriousBroker.it  This test is not yet approved or cleared by the [place]  FDA and has been authorized for  detection and/or diagnosis of SARS-CoV-2 by FDA under an Emergency Use Authorization (EUA). This EUA will remain in effect (meaning this test can be used) for the duration of the COVID-19 declaration under Section 564(b)(1) of the Act, 21 U.S.C. section 360bbb-3(b)(1), unless the authorization is terminated or revoked.  Performed at [facility], [address], [place], [place] [address]          Radiology Studies: ECHOCARDIOGRAM COMPLETE Result Date: 01/08/2024    ECHOCARDIOGRAM REPORT   Patient Name:   BOLIVAR KORANDA Date of Exam: 01/08/2024 Medical Rec #:  [id number]    Height:       71.0 in Accession #:    [id number]   Weight:       210.0 lb Date of Birth:  23-Oct-1950     BSA:          2.153 m Patient Age:    73 years     BP:           105/54 mmHg Patient Gender: M            HR:           76 bpm. Exam Location:  Inpatient Procedure: 2D Echo (Both Spectral and Color Flow Doppler were utilized during            procedure). Indications:    CHF  History:        Patient has no prior history of Echocardiogram examinations.                 CHF.  Sonographer:    Norleen Amour Referring Phys: [phone] STEVEN J NEWTON IMPRESSIONS  1. Left ventricular ejection fraction, by estimation, is 55 to 60%. Left ventricular ejection fraction by 2D MOD biplane is 59.3 %. The left ventricle has normal function. The left ventricle has no regional wall motion abnormalities. Left ventricular diastolic function could not be evaluated. There is the interventricular septum is flattened in systole and diastole, consistent with right ventricular pressure and volume overload.  2. Right ventricular systolic function is severely reduced. The right ventricular size is normal. There is moderately elevated pulmonary artery systolic pressure. The estimated right ventricular systolic pressure is 53.4  mmHg.  3. Left atrial size was mildly dilated.  4. The mitral valve is grossly normal. Trivial mitral valve regurgitation.  5. The  aortic valve has an indeterminant number of cusps. Aortic valve regurgitation is not visualized. Aortic valve sclerosis/calcification is present, without any evidence of aortic stenosis.  6. The inferior vena cava is dilated in size with <50% respiratory variability, suggesting right atrial pressure of 15 mmHg. Comparison(s): No prior Echocardiogram. FINDINGS  Left Ventricle: Left ventricular ejection fraction, by estimation, is 55 to 60%. Left ventricular ejection fraction by 2D MOD biplane is 59.3 %. The left ventricle has normal function. The left ventricle has no regional wall motion abnormalities. The left ventricular internal cavity size was normal in size. There is no left ventricular hypertrophy. The interventricular septum is flattened in systole and diastole, consistent with right ventricular pressure and volume overload. Left ventricular diastolic function could not be evaluated due to atrial fibrillation. Left ventricular diastolic function could not be evaluated. Right Ventricle: The right ventricular size is normal. No increase in right ventricular wall thickness. Right ventricular systolic function is severely reduced. There is moderately elevated pulmonary artery systolic pressure. The tricuspid regurgitant velocity is 3.10 m/s, and with an assumed right atrial pressure of 15 mmHg, the estimated right ventricular systolic pressure is 53.4 mmHg. Left Atrium: Left atrial size was mildly dilated. Right Atrium: Right atrial size was normal in size. Pericardium: There is no evidence of pericardial effusion. Mitral Valve: The mitral valve is grossly normal. Trivial mitral valve regurgitation. Tricuspid Valve: The tricuspid valve is grossly normal. Tricuspid valve regurgitation is trivial. Aortic Valve: The aortic valve has an indeterminant number of cusps. Aortic valve regurgitation is not visualized. Aortic valve sclerosis/calcification is present, without any evidence of aortic stenosis. Aortic valve  mean gradient measures 5.0 mmHg. Aortic valve peak gradient measures 10.2 mmHg. Aortic valve area, by VTI measures 1.56 cm. Pulmonic Valve: The pulmonic valve was grossly normal. Pulmonic valve regurgitation is trivial. Aorta: The aortic root and ascending aorta are structurally normal, with no evidence of dilitation. Venous: The inferior vena cava is dilated in size with less than 50% respiratory variability, suggesting right atrial pressure of 15 mmHg. IAS/Shunts: The interatrial septum is aneurysmal. No atrial level shunt detected by color flow Doppler.  LEFT VENTRICLE PLAX 2D                        Biplane EF (MOD) LVIDd:         4.10 cm         LV Biplane EF:   Left LVIDs:         2.60 cm                          ventricular LV PW:         1.10 cm                          ejection LV IVS:        0.90 cm                          fraction by LVOT diam:     2.00 cm                          2D MOD LV SV:  54                               biplane is LV SV Index:   25                               59.3 %. LVOT Area:     3.14 cm                                Diastology                                LV e' medial:    9.14 cm/s LV Volumes (MOD)               LV E/e' medial:  16.7 LV vol d, MOD    98.1 ml       LV e' lateral:   14.00 cm/s A2C:                           LV E/e' lateral: 10.9 LV vol d, MOD    84.5 ml A4C: LV vol s, MOD    46.1 ml A2C: LV vol s, MOD    27.5 ml A4C: LV SV MOD A2C:   52.0 ml LV SV MOD A4C:   84.5 ml LV SV MOD BP:    55.0 ml RIGHT VENTRICLE            IVC RV Basal diam:  3.60 cm    IVC diam: 2.00 cm RV S prime:     3.49 cm/s TAPSE (M-mode): 0.8 cm LEFT ATRIUM             Index        RIGHT ATRIUM           Index LA diam:        4.90 cm 2.28 cm/m   RA Area:     14.00 cm LA Vol (A2C):   77.7 ml 36.09 ml/m  RA Volume:   33.30 ml  15.47 ml/m LA Vol (A4C):   73.2 ml 34.00 ml/m LA Biplane Vol: 77.8 ml 36.14 ml/m  AORTIC VALVE                     PULMONIC VALVE AV Area (Vmax):    1.47  cm      PV Vmax:       1.08 m/s AV Area (Vmean):   1.45 cm      PV Peak grad:  4.7 mmHg AV Area (VTI):     1.56 cm AV Vmax:           160.00 cm/s AV Vmean:          105.000 cm/s AV VTI:            0.348 m AV Peak Grad:      10.2 mmHg AV Mean Grad:      5.0 mmHg LVOT Vmax:         75.00 cm/s LVOT Vmean:        48.300 cm/s LVOT VTI:          0.173 m LVOT/AV VTI ratio: 0.50  AORTA Ao Root diam: 3.20 cm Ao Asc  diam:  3.10 cm MITRAL VALVE                TRICUSPID VALVE MV Area (PHT): 5.02 cm     TR Peak grad:   38.4 mmHg MV Decel Time: 151 msec     TR Vmax:        310.00 cm/s MV E velocity: 153.00 cm/s MV A velocity: 57.70 cm/s   SHUNTS MV E/A ratio:  2.65         Systemic VTI:  0.17 m                             Systemic Diam: 2.00 cm Vinie Maxcy MD Electronically signed by Vinie Maxcy MD Signature Date/Time: 01/08/2024/2:51:37 PM    Final    CT CHEST ABDOMEN PELVIS WO CONTRAST Result Date: 01/08/2024 CLINICAL DATA:  Sepsis. EXAM: CT CHEST, ABDOMEN AND PELVIS WITHOUT CONTRAST TECHNIQUE: Multidetector CT imaging of the chest, abdomen and pelvis was performed following the standard protocol without IV contrast. RADIATION DOSE REDUCTION: This exam was performed according to the departmental dose-optimization program which includes automated exposure control, adjustment of the mA and/or kV according to patient size and/or use of iterative reconstruction technique. COMPARISON:  05/04/2023 FINDINGS: CT CHEST FINDINGS Cardiovascular: The heart is enlarged. Coronary artery calcification is evident. Moderate atherosclerotic calcification is noted in the wall of the thoracic aorta. Enlargement of the pulmonary outflow tract/main pulmonary arteries suggests pulmonary arterial hypertension. Mediastinum/Nodes: No mediastinal lymphadenopathy. No evidence for gross hilar lymphadenopathy although assessment is limited by the lack of intravenous contrast on the current study. The esophagus has normal imaging features. There is  no axillary lymphadenopathy. Lungs/Pleura: Fine architectural detail of lung parenchyma obscured by breathing motion. Within this limitation there is no pneumothorax or substantial pleural effusion. No dense focal consolidative airspace disease. Tiny calcified granuloma noted left lower lobe. Subsegmental atelectasis identified in the lingula and both lower lobes. The patchy nodular airspace disease seen previously in the lingula and both lower lobes appears to have resolved although again the motion artifact hinders assessment. Musculoskeletal: No worrisome lytic or sclerotic osseous abnormality. CT ABDOMEN PELVIS FINDINGS Hepatobiliary: No suspicious focal abnormality in the liver on this study without intravenous contrast. Nodularity of liver contour consistent with cirrhosis. Cholecystectomy. No intrahepatic or extrahepatic biliary dilation. Pancreas: No focal mass lesion. No dilatation of the main duct. No intraparenchymal cyst. No peripancreatic edema. Spleen: No splenomegaly. No suspicious focal mass lesion. Adrenals/Urinary Tract: 12 mm left adrenal nodule is likely an adenoma. 3.9 cm right adrenal mass is mildly heterogeneous with some areas demonstrating low attenuation suggesting adenoma this lesion has been present since 2016 when it measured 3 cm and demonstrated more substantial macroscopic fat density, features suggesting benign etiology. 3.9 x 3.2 cm relatively low-density lesion upper pole right kidney is similar to prior and potentially a cyst given the relative stability over multiple studies. Both kidneys are atrophic. No evidence for hydroureteronephrosis. Bladder is decompressed. Stomach/Bowel: Stomach is unremarkable. No gastric wall thickening. No evidence of outlet obstruction. Duodenum is normally positioned as is the ligament of Treitz. Hemostatic clip(s) identified in the duodenal lumen. No small bowel wall thickening. No small bowel dilatation. The terminal ileum is normal. The appendix  is normal. No gross colonic mass. No colonic wall thickening. Diverticular changes are noted in the left colon without evidence of diverticulitis. Vascular/Lymphatic: There is advanced atherosclerotic calcification of the abdominal aorta without aneurysm. There is no gastrohepatic  or hepatoduodenal ligament lymphadenopathy. Mild retroperitoneal lymphadenopathy appears progressive in the interval. 14 mm short axis left para-aortic node identified on 83/3. 11 mm short axis retrocaval node on 79/3 (previously about 7 mm short axis). 11 mm short axis right common iliac node on 93/3 was 7 mm previously. 11 mm short axis right external iliac node identified on 01/16/3. 13 mm short axis right groin node on 136/3 was 8 mm previously. Reproductive: Fiducial markers are identified in the prostate gland.Fluid in the scrotum suggests hydrocele with some probable edema in the soft tissues of the penis. Other: Moderate to large volume ascites is similar to prior. Diffuse body wall edema evident. Musculoskeletal: Right groin hernia contains free fluid. No worrisome lytic or sclerotic osseous abnormality. Advanced degenerative disc disease noted lumbar spine IMPRESSION: 1. No acute findings in the chest. The patchy nodular airspace disease seen previously in the lingula and both lower lobes appears to have resolved although again the motion artifact hinders assessment for complete resolution. 2. Enlargement of the pulmonary outflow tract/main pulmonary arteries suggests pulmonary arterial hypertension. 3. Cirrhosis with moderate to large volume ascites, similar to prior. 4. Mild retroperitoneal and right pelvic sidewall lymphadenopathy appears progressive in the interval. This is nonspecific and may be reactive. Close follow-up recommended. 5. Fluid in the scrotum suggests hydrocele with some probable edema in the soft tissues of the penis. 6. Right groin hernia contains free fluid. 7.  Aortic Atherosclerosis (ICD10-I70.0).  Electronically Signed   By: Camellia Candle M.D.   On: 01/08/2024 05:48   DG Chest Port 1 View if patient is in a treatment room. Result Date: 01/08/2024 EXAM: 1 VIEW(S) XRAY OF THE CHEST 01/08/2024 12:57:20 AM COMPARISON: Comparison with 10/31/2023. CLINICAL HISTORY: Suspected Sepsis. FINDINGS: LUNGS AND PLEURA: Low lung volumes accentuate pulmonary vascularity. No focal pulmonary opacity. No pulmonary edema. No pleural effusion. No pneumothorax. HEART AND MEDIASTINUM: Low lung volumes accentuate cardiomediastinal silhouette. Sternotomy and CABG. No acute abnormality of the cardiac and mediastinal silhouettes. BONES AND SOFT TISSUES: Sternotomy. No acute osseous abnormality. IMPRESSION: 1. No acute findings. Electronically signed by: [name] Gatlin MD 01/08/2024 01:03 AM EDT RP Workstation: [id number]        Scheduled Meds:  allopurinol   200 mg Oral Daily   Chlorhexidine  Gluconate Cloth  6 each Topical Q0600   insulin  aspart  0-6 Units Subcutaneous TID WC   levothyroxine   112 mcg Oral Q0600   midodrine  10 mg Oral TID WC   modafinil   200 mg Oral Daily   pantoprazole   40 mg Oral Daily   rosuvastatin   10 mg Oral Daily   sevelamer  carbonate  800 mg Oral TID WC   Continuous Infusions:  ceFEPime  (MAXIPIME ) IV 2 g (01/08/24 2252)   metronidazole  500 mg (01/08/24 2335)   vancomycin  1,000 mg (01/09/24 0037)     LOS: 1 day    Time spent: 55 minutes.    Leatrice Chapel, MD  [facility] Pager #: [phone] 7PM-7AM contact night coverage as above

## 2024-01-09 NOTE — Plan of Care (Signed)
  Problem: Fluid Volume: Goal: Hemodynamic stability will improve Outcome: Progressing   Problem: Clinical Measurements: Goal: Diagnostic test results will improve Outcome: Progressing Goal: Signs and symptoms of infection will decrease Outcome: Progressing   Problem: Respiratory: Goal: Ability to maintain adequate ventilation will improve Outcome: Progressing   Problem: Education: Goal: Knowledge of General Education information will improve Description: Including pain rating scale, medication(s)/side effects and non-pharmacologic comfort measures Outcome: Progressing   Problem: Clinical Measurements: Goal: Ability to maintain clinical measurements within normal limits will improve Outcome: Progressing Goal: Will remain free from infection Outcome: Progressing Goal: Diagnostic test results will improve Outcome: Progressing Goal: Respiratory complications will improve Outcome: Progressing Goal: Cardiovascular complication will be avoided Outcome: Progressing   Problem: Activity: Goal: Risk for activity intolerance will decrease Outcome: Progressing   Problem: Coping: Goal: Level of anxiety will decrease Outcome: Progressing   Problem: Elimination: Goal: Will not experience complications related to bowel motility Outcome: Progressing Goal: Will not experience complications related to urinary retention Outcome: Progressing   Problem: Pain Managment: Goal: General experience of comfort will improve and/or be controlled Outcome: Progressing   Problem: Safety: Goal: Ability to remain free from injury will improve Outcome: Progressing   Problem: Skin Integrity: Goal: Risk for impaired skin integrity will decrease Outcome: Progressing   Problem: Education: Goal: Ability to describe self-care measures that may prevent or decrease complications (Diabetes Survival Skills Education) will improve Outcome: Progressing Goal: Individualized Educational Video(s) Outcome:  Progressing   Problem: Fluid Volume: Goal: Ability to maintain a balanced intake and output will improve Outcome: Progressing   Problem: Health Behavior/Discharge Planning: Goal: Ability to identify and utilize available resources and services will improve Outcome: Progressing Goal: Ability to manage health-related needs will improve Outcome: Progressing   Problem: Metabolic: Goal: Ability to maintain appropriate glucose levels will improve Outcome: Progressing   Problem: Skin Integrity: Goal: Risk for impaired skin integrity will decrease Outcome: Progressing   Problem: Tissue Perfusion: Goal: Adequacy of tissue perfusion will improve Outcome: Progressing

## 2024-01-09 NOTE — TOC Initial Note (Signed)
 Transition of Care [facility]) - Initial/Assessment Note    Patient Details  Name: [name] MRN: [id number] Date of Birth: 07-23-1950  [facility] CM/SW Contact:    Waddell Barnie Rama, RN Phone Number: 01/09/2024, 2:55 PM  Clinical Narrative:                 From home with spouse,  per wife at the bedside, has PCP and insurance on file, states has no HH services in place at this time, he has all the DME he needs  at home.  States family member (wife)  will transport them home at [employer] and family is support system, states [facility] [address].  Pta self ambulatory with prosthetics and walker (bil amputee) .   There are no ICM needs identified  at this time.  Please place consult for ICM needs.    Expected Discharge Plan: Home/Self Care Barriers to Discharge: Continued Medical Work up   Patient Goals and CMS Choice Patient states their goals for this hospitalization and ongoing recovery are:: return home with wife   Choice offered to / list presented to : NA      Expected Discharge Plan and Services In-house Referral: NA Discharge Planning Services: CM Consult Post Acute Care Choice: NA Living arrangements for the past 2 months: Single Family Home                 DME Arranged: N/A DME Agency: NA       HH Arranged: NA          Prior Living Arrangements/Services Living arrangements for the past 2 months: Single Family Home Lives with:: Spouse Patient language and need for interpreter reviewed:: Yes Do you feel safe going back to the place where you live?: Yes      Need for Family Participation in Patient Care: Yes (Comment) Care giver support system in place?: Yes (comment) Current home services: DME (has all the DME he needs,) Criminal Activity/Legal Involvement Pertinent to Current Situation/Hospitalization: No - Comment as needed  Activities of Daily Living      Permission Sought/Granted Permission sought to share  information with : Case Manager Permission granted to share information with : Yes, Verbal Permission Granted              Emotional Assessment Appearance:: Appears stated age Attitude/Demeanor/Rapport: Engaged Affect (typically observed): Appropriate Orientation: : Oriented to Self, Oriented to Place, Oriented to  Time, Oriented to Situation Alcohol  / Substance Use: Not Applicable Psych Involvement: No (comment)  Admission diagnosis:  ESRD (end stage renal disease) (HCC) [N18.6] SIRS (systemic inflammatory response syndrome) (HCC) [R65.10] Acute pain of right knee [M25.561] Fever, unspecified fever cause [R50.9] Patient Active Problem List   Diagnosis Date Noted   SIRS (systemic inflammatory response syndrome) (HCC) 01/08/2024   Hypotension 01/08/2024   (HFpEF) heart failure with preserved ejection fraction (HCC) 01/08/2024   Right knee pain 01/08/2024   Gastroesophageal reflux disease without esophagitis 12/22/2023   CAP (community acquired pneumonia) 10/29/2023   Acute respiratory failure with hypoxia (HCC) 05/05/2023   Ascites 05/05/2023   Hyponatremia 05/05/2023   Right below-knee amputee (HCC) 07/29/2022   Sepsis due to pneumonia (HCC) 07/23/2022   Foot infection 07/22/2022   Acute respiratory failure (HCC) 07/22/2022   Anemia due to chronic blood loss 06/23/2022   Heme positive stool 06/23/2022   Benign neoplasm of cecum 06/23/2022   Benign neoplasm of transverse colon 06/23/2022   Benign neoplasm of descending  colon 06/23/2022   Abscess of right foot 05/17/2022   Contraindication to anticoagulation therapy GU Bleed 03/13/2022   Permanent atrial fibrillation (HCC) 08/18/2021   Anemia of chronic renal failure 08/18/2021   Thrombocytopenia 08/18/2021   Encephalopathy 08/18/2021   Action induced myoclonus 08/18/2021   Left below-knee amputee (HCC) 05/11/2021   Anemia of chronic disease 09/13/2020   COVID-19 virus infection 09/13/2020   Pressure injury of skin  06/29/2020   Gross hematuria 06/16/2020   Typical atrial flutter (HCC) 03/24/2020   Bilateral pleural effusion 02/24/2020   Healthcare maintenance 01/28/2020   Shortness of breath 01/28/2020   History of partial ray amputation of fourth toe of right foot 09/08/2019   Chronic cough 06/25/2019   Cutaneous abscess of right foot    Subacute osteomyelitis, right ankle and foot (HCC) 12/14/2018   AKI (acute kidney injury) 12/14/2018   ESRD on dialysis (HCC) 12/14/2018   S/P CABG (coronary artery bypass graft) 12/11/2018   Gait abnormality 08/24/2018   Diabetic neuropathy (HCC) 02/06/2018   Cervical myelopathy (HCC) 02/06/2018   Onychomycosis 10/30/2017   Other spondylosis with radiculopathy, cervical region 01/27/2017   Midfoot ulcer, right, limited to breakdown of skin (HCC) 11/15/2016   Lateral epicondylitis, left elbow 08/12/2016   Prostate cancer (HCC) 06/19/2016   Cellulitis of right foot 12/24/2015   Gout 09/14/2012   Essential hypertension 09/14/2012   Hypothyroidism 09/14/2012   CAD (coronary artery disease) 09/14/2012   Insulin  dependent type 2 diabetes mellitus (HCC) 09/14/2012   Hyperlipidemia associated with type 2 diabetes mellitus (HCC)    Neuropathy (HCC)    PCP:  [name], NP Pharmacy:   [phone] [place], [place] - [address] AT [address] OF [facility] [address] [place] [address] Phone: [phone] Fax: [phone]  [id number] - [place], [place] - [address] [place] [address] Phone: [phone] Fax: [phone]     Social Drivers of Health (SDOH) Social History: SDOH Screenings   Food Insecurity: No Food Insecurity (01/08/2024)  Housing: Low Risk  (01/08/2024)  Transportation Needs: No Transportation Needs (01/08/2024)  Utilities: Not At Risk (01/08/2024)  Depression (PHQ2-9): Low Risk  (12/22/2023)  Recent Concern: Depression (PHQ2-9) - Medium Risk  (12/19/2023)  Financial Resource Strain: Low Risk  (09/15/2023)  Physical Activity: Inactive (09/15/2023)  Social Connections: Moderately Isolated (01/08/2024)  Stress: No Stress Concern Present (09/15/2023)  Tobacco Use: Medium Risk (01/08/2024)  Health Literacy: Adequate Health Literacy (09/15/2023)   SDOH Interventions:     Readmission Risk Interventions    01/09/2024    2:52 PM 10/29/2023    3:12 PM 07/29/2022   10:39 AM  Readmission Risk Prevention Plan  Transportation Screening Complete Complete Complete  PCP or Specialist Appt within 3-5 Days  Complete   HRI or Home Care Consult Complete Complete   Palliative Care Screening Not Applicable Not Applicable   Medication Review (RN Care Manager) Complete Complete   PCP or Specialist appointment within 3-5 days of discharge   Complete  SW Recovery Care/Counseling Consult   Complete  Skilled Nursing Facility   Not Applicable

## 2024-01-09 NOTE — Progress Notes (Signed)
 Pt has been somnolent most of the day, with some early morning wakefulness. Just took 200 mg modafinil  - to help. Patient was hard to rouse last PM from 1 mg dilaudid . Went to dialysis . Returning got 1 mg dilaudid , and again 0.5 mg dilaudid  @ 0400. Patient was still groggy at 8 AM, but able to answer questions. Patient has been difficult to rouse, and falling back to sleep much of this AM.

## 2024-01-09 NOTE — Progress Notes (Signed)
 Pt receives out-pt HD at [facility] on [facility] on TTS 7:00 am chair time. Will assist as needed.   Randine Mungo Dialysis Navigator [phone]

## 2024-01-09 NOTE — Progress Notes (Signed)
   01/08/24 2145  Vitals  Temp 98.3 F (36.8 C)  Temp Source Oral  BP (!) 132/52  MAP (mmHg) 75  BP Location Right Arm  BP Method Automatic  Patient Position (if appropriate) Lying  Pulse Rate 88  Pulse Rate Source Monitor  ECG Heart Rate 79  Resp 20  MEWS COLOR  MEWS Score Color Green  Oxygen  Therapy  SpO2 95 %  O2 Device Room Air  MEWS Score  MEWS Temp 0  MEWS Systolic 0  MEWS Pulse 0  MEWS RR 0  MEWS LOC 0  MEWS Score 0   Pt back  from hemodialysis, pt is asleep but arousable, CCMD made aware.

## 2024-01-09 NOTE — Progress Notes (Signed)
 Pt has new swelling on left arm, and reported numbness as well, like it's falling asleep. Wife says right arm tremors more frequent than baseline. Pt has had poor appetite today, but got an ensure @1730 .

## 2024-01-10 DIAGNOSIS — N186 End stage renal disease: Secondary | ICD-10-CM | POA: Diagnosis not present

## 2024-01-10 DIAGNOSIS — Z7189 Other specified counseling: Secondary | ICD-10-CM | POA: Diagnosis not present

## 2024-01-10 DIAGNOSIS — M25561 Pain in right knee: Secondary | ICD-10-CM | POA: Diagnosis not present

## 2024-01-10 DIAGNOSIS — R6510 Systemic inflammatory response syndrome (SIRS) of non-infectious origin without acute organ dysfunction: Secondary | ICD-10-CM | POA: Diagnosis not present

## 2024-01-10 DIAGNOSIS — Z515 Encounter for palliative care: Secondary | ICD-10-CM | POA: Diagnosis not present

## 2024-01-10 LAB — CBC WITH DIFFERENTIAL/PLATELET
Abs Immature Granulocytes: 0.51 K/uL — ABNORMAL HIGH (ref 0.00–0.07)
Basophils Absolute: 0.1 K/uL (ref 0.0–0.1)
Basophils Relative: 1 %
Eosinophils Absolute: 0.1 K/uL (ref 0.0–0.5)
Eosinophils Relative: 1 %
HCT: 33.3 % — ABNORMAL LOW (ref 39.0–52.0)
Hemoglobin: 10.6 g/dL — ABNORMAL LOW (ref 13.0–17.0)
Immature Granulocytes: 4 %
Lymphocytes Relative: 3 %
Lymphs Abs: 0.4 K/uL — ABNORMAL LOW (ref 0.7–4.0)
MCH: 27.9 pg (ref 26.0–34.0)
MCHC: 31.8 g/dL (ref 30.0–36.0)
MCV: 87.6 fL (ref 80.0–100.0)
Monocytes Absolute: 0.7 K/uL (ref 0.1–1.0)
Monocytes Relative: 6 %
Neutro Abs: 10.1 K/uL — ABNORMAL HIGH (ref 1.7–7.7)
Neutrophils Relative %: 85 %
Platelets: 127 K/uL — ABNORMAL LOW (ref 150–400)
RBC: 3.8 MIL/uL — ABNORMAL LOW (ref 4.22–5.81)
RDW: 15.8 % — ABNORMAL HIGH (ref 11.5–15.5)
WBC: 11.8 K/uL — ABNORMAL HIGH (ref 4.0–10.5)
nRBC: 0 % (ref 0.0–0.2)

## 2024-01-10 LAB — GLUCOSE, CAPILLARY
Glucose-Capillary: 86 mg/dL (ref 70–99)
Glucose-Capillary: 81 mg/dL (ref 70–99)
Glucose-Capillary: 90 mg/dL (ref 70–99)
Glucose-Capillary: 113 mg/dL — ABNORMAL HIGH (ref 70–99)
Glucose-Capillary: 65 mg/dL — ABNORMAL LOW (ref 70–99)
Glucose-Capillary: 106 mg/dL — ABNORMAL HIGH (ref 70–99)

## 2024-01-10 LAB — RENAL FUNCTION PANEL
Albumin: 2 g/dL — ABNORMAL LOW (ref 3.5–5.0)
Anion gap: 17 — ABNORMAL HIGH (ref 5–15)
BUN: 54 mg/dL — ABNORMAL HIGH (ref 8–23)
CO2: 22 mmol/L (ref 22–32)
Calcium: 7.7 mg/dL — ABNORMAL LOW (ref 8.9–10.3)
Chloride: 98 mmol/L (ref 98–111)
Creatinine, Ser: 6.9 mg/dL — ABNORMAL HIGH (ref 0.61–1.24)
GFR, Estimated: 8 mL/min — ABNORMAL LOW (ref >60)
Glucose, Bld: 98 mg/dL (ref 70–99)
Phosphorus: 6 mg/dL — ABNORMAL HIGH (ref 2.5–4.6)
Potassium: 5.1 mmol/L (ref 3.5–5.1)
Sodium: 137 mmol/L (ref 135–145)

## 2024-01-10 LAB — HEPATITIS B SURFACE ANTIBODY, QUANTITATIVE: Hep B S AB Quant (Post): <3.5 m[IU]/mL — ABNORMAL LOW

## 2024-01-10 MED ORDER — HEPARIN SODIUM (PORCINE) 1000 UNIT/ML DIALYSIS: 1000.0000 [IU] | INTRAMUSCULAR | Status: DC | PRN

## 2024-01-10 MED ORDER — MAGNESIUM SULFATE 4 GM/100ML IV SOLN
4.0000 g | Freq: Once | INTRAVENOUS | Status: AC
  Administered 2024-01-10: 4 g via INTRAVENOUS
  Filled 2024-01-10: qty 100

## 2024-01-10 MED ORDER — LIDOCAINE-PRILOCAINE 2.5-2.5 % EX CREA: 1.0000 | TOPICAL_CREAM | CUTANEOUS | Status: DC | PRN

## 2024-01-10 MED ORDER — PENTAFLUOROPROP-TETRAFLUOROETH EX AERO: 1.0000 | INHALATION_SPRAY | CUTANEOUS | Status: DC | PRN

## 2024-01-10 MED ORDER — DEXTROSE 50 % IV SOLN
12.5000 g | INTRAVENOUS | Status: AC
  Administered 2024-01-10: 12.5 g via INTRAVENOUS
  Filled 2024-01-10: qty 50

## 2024-01-10 MED ORDER — ANTICOAGULANT SODIUM CITRATE 4% (200MG/5ML) IV SOLN: 5.0000 mL | Status: DC | PRN

## 2024-01-10 MED ORDER — NEPRO/CARBSTEADY PO LIQD: 237.0000 mL | ORAL | Status: DC | PRN

## 2024-01-10 MED ORDER — ALTEPLASE 2 MG IJ SOLR: 2.0000 mg | Freq: Once | INTRAMUSCULAR | Status: DC | PRN

## 2024-01-10 MED ORDER — HYDROMORPHONE HCL 1 MG/ML IJ SOLN
INTRAMUSCULAR | Status: AC
  Filled 2024-01-10: qty 1

## 2024-01-10 MED ORDER — LIDOCAINE HCL (PF) 1 % IJ SOLN: 5.0000 mL | INTRAMUSCULAR | Status: DC | PRN

## 2024-01-10 NOTE — Plan of Care (Signed)
  Problem: Fluid Volume: Goal: Hemodynamic stability will improve Outcome: Progressing   Problem: Clinical Measurements: Goal: Diagnostic test results will improve Outcome: Progressing   Problem: Respiratory: Goal: Ability to maintain adequate ventilation will improve Outcome: Progressing   Problem: Skin Integrity: Goal: Risk for impaired skin integrity will decrease Outcome: Progressing   Problem: Safety: Goal: Ability to remain free from injury will improve Outcome: Progressing   Problem: Skin Integrity: Goal: Risk for impaired skin integrity will decrease Outcome: Progressing   Problem: Tissue Perfusion: Goal: Adequacy of tissue perfusion will improve Outcome: Progressing

## 2024-01-10 NOTE — Progress Notes (Signed)
 Palliative Medicine Progress Note   Patient Name: [name]       Date: 01/10/2024 DOB: [birth year]  Age: 73 y.o. MRN#: [id number] Attending Physician: Rosario Leatrice FERNS, MD Primary Care Physician: [name], NP Admit Date: 01/08/2024  Reason for Consultation/Follow-up: {Reason for Consult:23484}  HPI/Patient Profile: 73 y.o. male  with past medical history of ESRD on HD, permanent atrial fibrillation not on AC due to GI bleed, chronic anemia, CAD status post CABG in 2005, DMT2, PAD status post bilateral BKA, and recurrent ascites requiring frequent paracentesis who presented to the ED on 01/08/2024 with altered mental status and knee pain.  He is admitted with SIRS, hypotension, encephalopathy, ascites, and right knee pain.  Right knee synovial fluid obtained in the ED showed 3 WBCs and no crystals.   Palliative Medicine has been consulted for goals of care discussions and complex medical decision making.   Subjective: Per attending MD progress note yesterday, concern for gouty arthritis of the right knee, however uric acid level normal.   Update received from RN and patient assessed. He is lethargic/drowsy, but will wake briefly to voice.   I met with his wife/[name]. She expresses significant concern with patient's current condition, as this is very different from his baseline.    Objective:  Physical Exam Vitals reviewed.  Constitutional:      General: He is not in acute distress.    Appearance: He is ill-appearing.  Neurological:     Mental Status: He is lethargic.             Vital Signs: BP (!) 138/50 (BP Location: Right Arm)   Pulse 71   Temp 98.3 F (36.8 C) (Oral)   Resp (!) 21   Ht 5' 11 (1.803 m)   Wt 88.3 kg   SpO2 100%   BMI 27.15 kg/m  SpO2: SpO2:  100 % O2 Device: O2 Device: Room Air O2 Flow Rate: O2 Flow Rate (L/min): 2 L/min  Intake/output summary:  Intake/Output Summary (Last 24 hours) at 01/10/2024 1116 Last data filed at 01/10/2024 0900 Gross per 24 hour  Intake 80 ml  Output --  Net 80 ml    LBM: Last BM Date : 01/09/24     Palliative Assessment/Data: ***     Palliative Medicine Assessment & Plan   Assessment: Principal  Problem:   SIRS (systemic inflammatory response syndrome) (HCC) Active Problems:   Essential hypertension   CAD (coronary artery disease)   Insulin  dependent type 2 diabetes mellitus (HCC)   ESRD on dialysis (HCC)   Pressure injury of skin   Anemia of chronic disease   Permanent atrial fibrillation (HCC)   Thrombocytopenia   Encephalopathy   Ascites   Gastroesophageal reflux disease without esophagitis   Hypotension   (HFpEF) heart failure with preserved ejection fraction (HCC)   Right knee pain    Recommendations/Plan: ***  Goals of Care and Additional Recommendations: Limitations on Scope of Treatment: {Recommended Scope and Preferences:21019}  Code Status:   Prognosis:  {Palliative Care Prognosis:23504}  Discharge Planning: {Palliative dispostion:23505}  Care plan was discussed with ***  Thank you for allowing the Palliative Medicine Team to assist in the care of this patient.   ***   Recardo KATHEE Loll, NP   Please contact Palliative Medicine Team phone at [phone] for questions and concerns.  For individual providers, please see AMION.

## 2024-01-10 NOTE — Progress Notes (Signed)
 [facility] Progress Note  Subjective:  Seen in room No new c/o's   Vitals:   01/10/24 0755 01/10/24 1205 01/10/24 1406 01/10/24 1428  BP: (!) 138/50 (!) 135/46 (!) 130/45 (!) 134/54  Pulse: 71 (!) 59 62 (!) 57  Resp: (!) 21 20 (!) 21 (!) 24  Temp: 98.3 F (36.8 C) 98.2 F (36.8 C) 97.6 F (36.4 C)   TempSrc: Oral Oral    SpO2: 100% 100%  97%  Weight:   90.4 kg   Height:        Exam: Gen chronically ill appearing, +lethargic but arouses briefly and tries to answer questions Sclera anicteric, throat clear  No jvd or bruits Chest clear bilat to bases RRR no MRG Abd soft ntnd no mass or ascites +bs Ext bilateral BKA, bilat 1+hip edema Neuro lethargic, Ox 2 , nf    LUA AVF+bruit    Home bp meds: Propranolol  40mg  every day Aldactone 25 every day Torsemide  20mg  bid (taking once daily)    OP HD: TTS [facility] from July 2025 --> 3h  B 400  87kg  2K bath  AVF  Heparin  none     Assessment/ Plan: SIRS: [name], ^WBC, LA 2.2.  R knee aspirate cx negative. IV abx per pmd. CT c/a/p w/o acute findings. Per pmd.  AMS: soft bp's, got IVF x 1 L, midodrine started for BP support. IV abx as above.  ESRD: on HD TTS. Has not missed HD.  HD today.  BP: BP's 105/ 45 range, midodrine 10mg  tid  started by pmd. Volume: CXR was clear, close to dry wt, mild vol excess. Lower vol as tol w/ HD.  Anemia of esrd: Hb 10-12 here, no esa needs. Will follow.  Recurrent ascites: possibly d/t heart failure. Prior w/u did not find liver disease. Gets LVP regularly in OP setting.     Myer Fret MD  [facility] 01/10/2024, 2:58 PM  Recent Labs  Lab 01/08/24 0541 01/10/24 0258  HGB 10.5* 10.6*  ALBUMIN  1.7* 2.0*  CALCIUM  6.6* 7.7*  PHOS 5.2* 6.0*  CREATININE 6.47* 6.90*  K 4.6 5.1   No results for input(s): IRON, TIBC, FERRITIN in the last 168 hours. Inpatient medications:  allopurinol   200 mg Oral Daily   Chlorhexidine  Gluconate Cloth  6 each Topical Q0600   Chlorhexidine   Gluconate Cloth  6 each Topical Q0600   insulin  aspart  0-6 Units Subcutaneous TID WC   levothyroxine   112 mcg Oral Q0600   lidocaine   1 patch Transdermal Q24H   midodrine  10 mg Oral TID WC   modafinil   200 mg Oral Daily   pantoprazole   40 mg Oral Daily   rosuvastatin   10 mg Oral Daily   sevelamer  carbonate  800 mg Oral TID WC    anticoagulant sodium citrate      ceFEPime  (MAXIPIME ) IV 2 g (01/08/24 2252)   magnesium  sulfate bolus IVPB     metronidazole  500 mg (01/10/24 0915)   vancomycin  1,000 mg (01/09/24 0037)   acetaminophen  **OR** acetaminophen , alteplase , anticoagulant sodium citrate , feeding supplement (NEPRO CARB STEADY), heparin , HYDROcodone -acetaminophen , HYDROmorphone  (DILAUDID ) injection, lidocaine  (PF), lidocaine -prilocaine , ondansetron  (ZOFRAN ) IV, pentafluoroprop-tetrafluoroeth

## 2024-01-10 NOTE — Progress Notes (Signed)
 PROGRESS NOTE    [name]  FMW:[id number] DOB: 1950-11-07 DOA: 01/08/2024 PCP: [name], NP  Outpatient Specialists:     Brief Narrative:  Patient is a 73 year old male with multiple medical and cardiac history.  Patient carries diagnosis of gout, hypertension, diabetes mellitus, prior history of CABG and ESRD on hemodialysis TTS.  Patient is s/p bilateral BKA.  Patient was admitted with SIRS, encephalopathy, right knee pain with associated redness of the skin.  Patient underwent arthroscopy of the right knee at the emergency room and analysis was negative for crystals, with WBC of 3.  Patient is currently on IV vancomycin , Flagyl  and cefepime .  Cultures are pending.  Will check uric acid level.  Will hold off on colchicine for now.  01/09/2024: Patient was seen alongside patient's nurse.  All questions answered.  Cultures are pending.  Follow uric acid level.  Follow cultures.  Low threshold to hold allopurinol  i.e. if acute gout flare is confirmed.  01/10/2024: Patient seen today.  Patient seems to be improving.  WBC has gone down from 15.1-11.8.  Culture of the right knee synovial fluid has not grown any organisms in 2 days.  Uric acid was 4.2.  Continue IV antibiotics.  For hemodialysis today.  Follow final culture results.  Continue IV antibiotics.  Cellulitic changes around the right knee improving.  Patient's wife updated over the phone.     Assessment & Plan:   Principal Problem:   SIRS (systemic inflammatory response syndrome) (HCC) Active Problems:   Essential hypertension   CAD (coronary artery disease)   Insulin  dependent type 2 diabetes mellitus (HCC)   ESRD on dialysis (HCC)   Pressure injury of skin   Anemia of chronic disease   Permanent atrial fibrillation (HCC)   Thrombocytopenia   Encephalopathy   Ascites   Gastroesophageal reflux disease without esophagitis   Hypotension   (HFpEF) heart failure with preserved ejection fraction (HCC)   Right knee  pain    SIRS (systemic inflammatory response syndrome) (HCC)/cellulitis around right knee area: - Patient met borderline SIRS criteria on presentation, with noted Tmax 100.1, respiration in the 20s, white count of near 12 -Confounding issues include ESRD as well as systolic pressures in the 80s Lactate of 2-2.2 -Noted right knee swelling on initial presentation with fairly normal fluid analysis -Chest x-ray stable -CT chest abdomen pelvis with otherwise stable findings -Started on empiric Rocephin  and vancomycin  in the ER.  Broadened to cefepime , vanc and flagyl  by the admitting team. -Follow right knee synovial fluid analysis and final cultures. - Uric acid of 4.2. - Cellulitic changes improving.  Hypotension SBP in the 80s despite fluid bolus in setting of SIRS  Suspect multifactorial w/ contribution of SIRS, medication, ESRD, cirrhosis w/ third spacing, dCHF  Holding offending agents  Continue midodrine 10 mg p.o. 3 times daily. Systolic blood pressure improved to 100+ millimeters mercury.   Continue to monitor closely  01/10/2024: Hypotension has resolved.  Blood pressure of 134/54 mmHg.   Encephalopathy Mild generalized lethargy and confusion on presentation Toxic metabolic Suspect multifactorial with contributions of SIRS, ESRD, hypotension BUN 58 Ammonia level pending Nonfocal neuroexam on evaluation Monitor mentation with treatment 01/09/2024: Seems to be improving.  Ammonia level of 31.  Repeat CBC.  Last WBC of 15.1. 01/10/2024: Encephalopathy persists.   Permanent atrial fibrillation (HCC) Rate controlled atrial fibrillation on presentation  Not an anticoagulation in setting of hx/o GU bleeding    Ascites Recurrent ascites on CT today  Plan for  paracentesis as hemodynamics tolerate  Follow    Right knee pain -R knee pain and swelling on presentation in setting of SIRS and hypotension S/p arthocentesis- less suggestive for infection or crystals  -Patient is  currently on IV vancomycin , cefepime  and Flagyl . - Check uric acid level. - Cellulitic changes around the right knee noted. - Patient has history of gout. - Cannot rule out acute gouty arthritis of right knee.   01/10/2024: Uric acid of 4.2.  Right knee pain has improved significantly.  Continue antibiotics.   ESRD on dialysis [facility])  Continue hemodialysis TTS. Done 425: For hemodialysis today.   CAD (coronary artery disease) Baseline history of CAD status post CABG No active chest pain EKG stable Monitor   Insulin  dependent type 2 diabetes mellitus (HCC) Blood sugar has improved. Continue to hold on SSI for now  Dextrose  infused fluids as clinically indicated   Anemia of chronic disease Hgb 12 today  Near baseline  No overt signs of bleeding at present  Monitor    Thrombocytopenia Platelet count stable low 140s today Monitor   Essential hypertension/hypotension -Antihypertensives are on hold. - Continue to monitor closely    (HFpEF) heart failure with preserved ejection fraction (HCC) 2D ECHO 11/2022 with EF 60-65%, grade 1 mitral regurgitation, moderate TR, mild PH + volume overload and hypotension on presentation in setting of SIRS, ESRD, recurrent ascites   01/09/2024: Repeat echo revealed ejection fraction of 55 to 60%, left ventricular diastolic function could not be evaluated, interventricular septum is flattened in systole and diastole, consistent with right ventricular pressure and volume overload, dilated inferior vena cava with less than 50% respiratory variability.  Patient is on hemodialysis TTS.   Gastroesophageal reflux disease without esophagitis PPI    Pressure injury of skin Skin check pending to assess for any potential soft tissue source for SIRS   DVT prophylaxis:  Code Status: Full code Family Communication: Wife over the phone. Disposition Plan: Inpatient.   Consultants:  None.  Procedures:  Arthrocentesis of left knee and fluid  aspiration done by the ER provider  Antimicrobials:  IV vancomycin . IV Flagyl . IV cefepime .   Subjective: Patient remains encephalopathic. No significant history from patient.    Objective: Vitals:   01/10/24 0334 01/10/24 0755 01/10/24 1205 01/10/24 1406  BP: (!) 131/52 (!) 138/50 (!) 135/46 (!) 130/45  Pulse: 70 71 (!) 59 62  Resp: (!) 23 (!) 21 20 (!) 21  Temp: 99 F (37.2 C) 98.3 F (36.8 C) 98.2 F (36.8 C) 97.6 F (36.4 C)  TempSrc: Oral Oral Oral   SpO2: 97% 100% 100%   Weight:    90.4 kg  Height:        Intake/Output Summary (Last 24 hours) at 01/10/2024 1425 Last data filed at 01/10/2024 1300 Gross per 24 hour  Intake 120 ml  Output --  Net 120 ml   Filed Weights   01/08/24 2111 01/09/24 0321 01/10/24 1406  Weight: 84.5 kg 88.3 kg 90.4 kg    Examination:  General exam: Appears calm and comfortable.  Bilateral below-knee amputation.  Respiratory system: Clear to auscultation. Respiratory effort normal. Cardiovascular system: S1 & S2, systolic murmur with query increased intensity of S2 component of the heart sound.   Gastrointestinal system: Abdomen is soft and nontender  Central nervous system: Alert and oriented.  Extremities: Bilateral BKA  Data Reviewed: I have personally reviewed following labs and imaging studies  CBC: Recent Labs  Lab 01/08/24 0046 01/08/24 0541 01/10/24 0258  WBC  11.6* 15.1* 11.8*  NEUTROABS 10.6* 13.5* 10.1*  HGB 12.0* 10.5* 10.6*  HCT 38.0* 34.2* 33.3*  MCV 88.8 91.4 87.6  PLT 148* 141* 127*   Basic Metabolic Panel: Recent Labs  Lab 01/08/24 0046 01/08/24 0541 01/10/24 0258  NA 136 134* 137  K 5.7* 4.6 5.1  CL 99 96* 98  CO2 22 21* 22  GLUCOSE 76 101* 98  BUN 58* 55* 54*  CREATININE 6.73* 6.47* 6.90*  CALCIUM  7.3* 6.6* 7.7*  MG  --  1.0*  --   PHOS  --  5.2* 6.0*   GFR: Estimated Creatinine Clearance: 10.8 mL/min (A) (by C-G formula based on SCr of 6.9 mg/dL (H)). Liver Function Tests: Recent Labs   Lab 01/08/24 0046 01/08/24 0541 01/10/24 0258  AST 22  --   --   ALT 12  --   --   ALKPHOS 165*  --   --   BILITOT 1.0  --   --   PROT 4.9*  --   --   ALBUMIN  2.3* 1.7* 2.0*   No results for input(s): LIPASE, AMYLASE in the last 168 hours. Recent Labs  Lab 01/08/24 1025  AMMONIA 31   Coagulation Profile: Recent Labs  Lab 01/08/24 0046  INR 1.1   Cardiac Enzymes: No results for input(s): CKTOTAL, CKMB, CKMBINDEX, TROPONINI in the last 168 hours. BNP (last 3 results) Recent Labs    10/28/23 2109  PROBNP >35,000.0*   HbA1C: No results for input(s): HGBA1C in the last 72 hours. CBG: Recent Labs  Lab 01/09/24 1151 01/09/24 1527 01/09/24 2143 01/10/24 0620 01/10/24 1207  GLUCAP 117* 86 86 81 90   Lipid Profile: No results for input(s): CHOL, HDL, LDLCALC, TRIG, CHOLHDL, LDLDIRECT in the last 72 hours. Thyroid  Function Tests: No results for input(s): TSH, T4TOTAL, FREET4, T3FREE, THYROIDAB in the last 72 hours. Anemia Panel: No results for input(s): VITAMINB12, FOLATE, FERRITIN, TIBC, IRON, RETICCTPCT in the last 72 hours. Urine analysis:    Component Value Date/Time   COLORURINE YELLOW 07/22/2022 2356   APPEARANCEUR CLEAR 07/22/2022 2356   LABSPEC 1.022 07/22/2022 2356   PHURINE 6.5 07/22/2022 2356   GLUCOSEU NEGATIVE 07/22/2022 2356   HGBUR SMALL (A) 07/22/2022 2356   BILIRUBINUR NEGATIVE 07/22/2022 2356   KETONESUR NEGATIVE 07/22/2022 2356   PROTEINUR >300 (A) 07/22/2022 2356   UROBILINOGEN 0.2 12/28/2014 1031   NITRITE NEGATIVE 07/22/2022 2356   LEUKOCYTESUR TRACE (A) 07/22/2022 2356   Sepsis Labs: @LABRCNTIP (procalcitonin:4,lacticidven:4)  ) Recent Results (from the past 240 hours)  Culture, blood (Routine x 2)     Status: None (Preliminary result)   Collection Time: 01/08/24 12:46 AM   Specimen: BLOOD RIGHT ARM  Result Value Ref Range Status   Specimen Description BLOOD RIGHT ARM  Final    Special Requests   Final    BOTTLES DRAWN AEROBIC AND ANAEROBIC Blood Culture adequate volume   Culture   Final    NO GROWTH 2 DAYS Performed at [facility], [address], [place], [place] [address]    Report Status PENDING  Incomplete  Culture, blood (Routine x 2)     Status: None (Preliminary result)   Collection Time: 01/08/24 12:46 AM   Specimen: BLOOD RIGHT ARM  Result Value Ref Range Status   Specimen Description BLOOD RIGHT ARM  Final   Special Requests   Final    BOTTLES DRAWN AEROBIC AND ANAEROBIC Blood Culture results may not be optimal due to an inadequate volume of blood  received in culture bottles   Culture   Final    NO GROWTH 2 DAYS Performed at [facility], [address], [place], [place] [address]    Report Status PENDING  Incomplete  Body fluid culture w Gram Stain     Status: None (Preliminary result)   Collection Time: 01/08/24  1:51 AM   Specimen: KNEE; Body Fluid  Result Value Ref Range Status   Specimen Description KNEE  Final   Special Requests NONE  Final   Gram Stain   Final    RARE WBC PRESENT, PREDOMINANTLY PMN NO ORGANISMS SEEN    Culture   Final    NO GROWTH 2 DAYS Performed at [facility], [address], [place], [place] [address]    Report Status PENDING  Incomplete  Resp panel by RT-PCR (RSV, Flu A&B, Covid) KNEE     Status: None   Collection Time: 01/08/24  1:51 AM   Specimen: KNEE; Nasal Swab  Result Value Ref Range Status   SARS Coronavirus 2 by RT PCR NEGATIVE NEGATIVE Final   Influenza A by PCR NEGATIVE NEGATIVE Final   Influenza B by PCR NEGATIVE NEGATIVE Final    Comment: (NOTE) The Xpert Xpress SARS-CoV-2/FLU/RSV plus assay is intended as an aid in the diagnosis of influenza from Nasopharyngeal swab specimens and should not be used as a sole basis for treatment. Nasal washings and aspirates are unacceptable for Xpert Xpress SARS-CoV-2/FLU/RSV testing.  Fact Sheet for  Patients: BloggerCourse.com  Fact Sheet for Healthcare Providers: SeriousBroker.it  This test is not yet approved or cleared by the [place]  FDA and has been authorized for detection and/or diagnosis of SARS-CoV-2 by FDA under an Emergency Use Authorization (EUA). This EUA will remain in effect (meaning this test can be used) for the duration of the COVID-19 declaration under Section 564(b)(1) of the Act, 21 U.S.C. section 360bbb-3(b)(1), unless the authorization is terminated or revoked.     Resp Syncytial Virus by PCR NEGATIVE NEGATIVE Final    Comment: (NOTE) Fact Sheet for Patients: BloggerCourse.com  Fact Sheet for Healthcare Providers: SeriousBroker.it  This test is not yet approved or cleared by the [place]  FDA and has been authorized for detection and/or diagnosis of SARS-CoV-2 by FDA under an Emergency Use Authorization (EUA). This EUA will remain in effect (meaning this test can be used) for the duration of the COVID-19 declaration under Section 564(b)(1) of the Act, 21 U.S.C. section 360bbb-3(b)(1), unless the authorization is terminated or revoked.  Performed at [facility], [address] [place], [place] [address]          Radiology Studies: No results found.       Scheduled Meds:  allopurinol   200 mg Oral Daily   Chlorhexidine  Gluconate Cloth  6 each Topical Q0600   Chlorhexidine  Gluconate Cloth  6 each Topical Q0600   insulin  aspart  0-6 Units Subcutaneous TID WC   levothyroxine   112 mcg Oral Q0600   lidocaine   1 patch Transdermal Q24H   midodrine  10 mg Oral TID WC   modafinil   200 mg Oral Daily   pantoprazole   40 mg Oral Daily   rosuvastatin   10 mg Oral Daily   sevelamer  carbonate  800 mg Oral TID WC   Continuous Infusions:  anticoagulant sodium citrate      ceFEPime  (MAXIPIME ) IV 2 g (01/08/24 2252)   magnesium  sulfate  bolus IVPB     metronidazole  500 mg (01/10/24 0915)  vancomycin  1,000 mg (01/09/24 0037)     LOS: 2 days    Time spent: 55 minutes.    Leatrice Chapel, MD  [facility] Pager #: [phone] 7PM-7AM contact night coverage as above

## 2024-01-11 DIAGNOSIS — Z89511 Acquired absence of right leg below knee: Secondary | ICD-10-CM

## 2024-01-11 DIAGNOSIS — Z89512 Acquired absence of left leg below knee: Secondary | ICD-10-CM

## 2024-01-11 DIAGNOSIS — R6510 Systemic inflammatory response syndrome (SIRS) of non-infectious origin without acute organ dysfunction: Secondary | ICD-10-CM | POA: Diagnosis not present

## 2024-01-11 DIAGNOSIS — N186 End stage renal disease: Secondary | ICD-10-CM | POA: Diagnosis not present

## 2024-01-11 DIAGNOSIS — Z992 Dependence on renal dialysis: Secondary | ICD-10-CM | POA: Diagnosis not present

## 2024-01-11 LAB — GLUCOSE, CAPILLARY
Glucose-Capillary: 92 mg/dL (ref 70–99)
Glucose-Capillary: 95 mg/dL (ref 70–99)
Glucose-Capillary: 102 mg/dL — ABNORMAL HIGH (ref 70–99)
Glucose-Capillary: 92 mg/dL (ref 70–99)
Glucose-Capillary: 86 mg/dL (ref 70–99)

## 2024-01-11 LAB — BODY FLUID CULTURE W GRAM STAIN: Culture: NO GROWTH

## 2024-01-11 LAB — GLUCOSE, BODY FLUID OTHER: Glucose, Body Fluid Other: 90 mg/dL

## 2024-01-11 LAB — MAGNESIUM: Magnesium: 2.1 mg/dL (ref 1.7–2.4)

## 2024-01-11 MED ORDER — HYDROMORPHONE HCL 1 MG/ML IJ SOLN
0.5000 mg | INTRAMUSCULAR | Status: DC | PRN
  Administered 2024-01-11: 0.5 mg via INTRAVENOUS
  Filled 2024-01-11: qty 1

## 2024-01-11 NOTE — Plan of Care (Signed)
  Problem: Clinical Measurements: Goal: Signs and symptoms of infection will decrease Outcome: Progressing   Problem: Respiratory: Goal: Ability to maintain adequate ventilation will improve Outcome: Progressing   Problem: Clinical Measurements: Goal: Will remain free from infection Outcome: Progressing   Problem: Skin Integrity: Goal: Risk for impaired skin integrity will decrease Outcome: Progressing   Problem: Metabolic: Goal: Ability to maintain appropriate glucose levels will improve Outcome: Progressing   Problem: Fluid Volume: Goal: Hemodynamic stability will improve Outcome: Not Progressing   Problem: Clinical Measurements: Goal: Diagnostic test results will improve Outcome: Not Progressing   Problem: Education: Goal: Knowledge of General Education information will improve Description: Including pain rating scale, medication(s)/side effects and non-pharmacologic comfort measures Outcome: Not Progressing   Problem: Activity: Goal: Risk for activity intolerance will decrease Outcome: Not Progressing   Problem: Nutrition: Goal: Adequate nutrition will be maintained Outcome: Not Progressing   Problem: Elimination: Goal: Will not experience complications related to urinary retention Outcome: Not Progressing   Problem: Coping: Goal: Ability to adjust to condition or change in health will improve Outcome: Not Progressing   Problem: Health Behavior/Discharge Planning: Goal: Ability to manage health-related needs will improve Outcome: Not Progressing

## 2024-01-11 NOTE — Consult Note (Signed)
 Infectious Disease     Reason for Consult:Sepsis, recurrent cellulitis    Referring Physician: Dr Rosario Date of Admission:  01/08/2024   Principal Problem:   SIRS (systemic inflammatory response syndrome) (HCC) Active Problems:   Essential hypertension   CAD (coronary artery disease)   Insulin  dependent type 2 diabetes mellitus (HCC)   ESRD on dialysis (HCC)   Pressure injury of skin   Anemia of chronic disease   Permanent atrial fibrillation (HCC)   Thrombocytopenia   Encephalopathy   Ascites   Gastroesophageal reflux disease without esophagitis   Hypotension   (HFpEF) heart failure with preserved ejection fraction (HCC)   Right knee pain   HPI: [name] is a 73 y.o. male with past medical history of ESRD on HD, permanent atrial fibrillation not on AC due to GI bleed, chronic anemia, CAD status post CABG in 2005, DMT2, PAD status post bilateral BKA, and recurrent ascites requiring frequent paracentesis who presented to the ED on 01/08/2024 with altered mental status and knee pain. He was  admitted with SIRS, hypotension, encephalopathy, ascites, and right knee pain. On admit wbc 15, lactate 2.2 T 100.1. Right knee synovial fluid obtained in the ED showed 3 WBCs and no crystals. CX ngtd BCX neg.  CT abd with mult chronic findings including ascites and LAN but nothing acute Has a L BKA stump which did not appear infected and a healing wound suerpior to L knee also not infected. Clinically improving on vancomycin  and cefepime  and metronidazole  Reports that he wears Bil BKA prosthesis.  Able to flex and extend R knee now but still swelling over bursa  Past Medical History:  Diagnosis Date   Acute osteomyelitis of metatarsal bone of left foot (HCC)    Ambulates with cane    straight cane   Anemia    Cervical myelopathy (HCC) 02/06/2018   Chronic kidney disease    dailysis M W F- home   Community acquired pneumonia of left lower lobe of lung 08/18/2021   Complication of  anesthesia    Coronary artery disease    Dehiscence of amputation stump of left lower extremity (HCC)    Diabetes mellitus without complication (HCC)    type2   Diabetic foot ulcer (HCC)    Diabetic neuropathy (HCC) 02/06/2018   Dysrhythmia    Gait abnormality 08/24/2018   GERD (gastroesophageal reflux disease)     01/06/20- not current   Gout    History of blood transfusion    History of blood transfusion    History of kidney stones    passed stones   Hypercholesteremia    Hypertension    Hypothyroidism    Neuromuscular disorder (HCC)    neuropathy left leg and bilateral feet   Neuropathy    Partial nontraumatic amputation of foot, 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   PONV (postoperative nausea and vomiting)    Prostate cancer (HCC)    PVD (peripheral vascular disease)    with amputations   Past Surgical History:  Procedure Laterality Date   A-FLUTTER ABLATION N/A 04/06/2020   Procedure: A-FLUTTER ABLATION;  Surgeon: Waddell Danelle ORN, MD;  [facility]: [facility] INVASIVE CV LAB;  Service: Cardiovascular;  Laterality: N/A;   A/V FISTULAGRAM N/A 03/05/2023   Procedure: A/V Fistulagram;  Surgeon: Pearline [name] RAMAN, MD;  Location: [facility] INVASIVE CV LAB;  Service: Vascular;  Laterality: N/A;   A/V SHUNT INTERVENTION Left 11/07/2023   Procedure: A/V SHUNT INTERVENTION;  Surgeon: Gretta Lonni PARAS, MD;  Location: [facility] PV LAB;  Service: Cardiovascular;  Laterality: Left;   AMPUTATION Left 12/25/2013   Procedure: AMPUTATION RAY LEFT 5TH RAY;  Surgeon: Jerona Harden GAILS, MD;  Location: [facility] ORS;  Service: Orthopedics;  Laterality: Left;   AMPUTATION Right 12/15/2018   Procedure: AMPUTATION OF 4TH AND 5TH TOES RIGHT FOOT;  Surgeon: Harden Jerona GAILS, MD;  Location: [facility] OR;  Service: Orthopedics;  Laterality: Right;   AMPUTATION Left 02/02/2021   Procedure: LEFT FOOT 4TH RAY AMPUTATION;  Surgeon: Harden Jerona GAILS, MD;  Location: [facility] OR;  Service: Orthopedics;  Laterality: Left;   AMPUTATION Left 05/09/2021   Procedure: LEFT  BELOW KNEE AMPUTATION;  Surgeon: Harden Jerona GAILS, MD;  Location: [facility] OR;  Service: Orthopedics;  Laterality: Left;   AMPUTATION Right 07/26/2022   Procedure: RIGHT BELOW KNEE AMPUTATION;  Surgeon: Harden Jerona GAILS, MD;  Location: [facility] OR;  Service: Orthopedics;  Laterality: Right;   APPLICATION OF WOUND VAC Right 12/15/2018   Procedure: APPLICATION OF WOUND VAC;  Surgeon: Harden Jerona GAILS, MD;  Location: [facility] OR;  Service: Orthopedics;  Laterality: Right;   AV FISTULA PLACEMENT Left 03/25/2019   Procedure: LEFT ARM ARTERIOVENOUS (AV) FISTULA CREATION;  Surgeon: Serene Gaile ORN, MD;  Location: [facility] OR;  Service: Vascular;  Laterality: Left;   BACK SURGERY     BASCILIC VEIN TRANSPOSITION Left 05/20/2019   Procedure: SECOND STAGE LEFT BASCILIC VEIN TRANSPOSITION;  Surgeon: Serene Gaile ORN, MD;  Location: [facility] OR;  Service: Vascular;  Laterality: Left;   BIOPSY  06/22/2022   Procedure: BIOPSY;  Surgeon: Wilhelmenia Aloha Raddle., MD;  Location: [facility] ENDOSCOPY;  Service: Gastroenterology;;   CARDIAC CATHETERIZATION  02/17/2014   CATARACT EXTRACTION Bilateral    CHOLECYSTECTOMY     COLONOSCOPY  2011   [place], Normal   COLONOSCOPY N/A 06/23/2022   Procedure: COLONOSCOPY;  Surgeon: Legrand Victory LITTIE DOUGLAS, MD;  Location: [facility] ENDOSCOPY;  Service: Gastroenterology;  Laterality: N/A;   CORONARY ARTERY BYPASS GRAFT  2008   CYSTOSCOPY N/A 06/29/2020   Procedure: CYSTOSCOPY WITH CLOT EVACUATION AND  FULGERATION;  Surgeon: Matilda Senior, MD;  Location: [facility] OR;  Service: Urology;  Laterality: N/A;   CYSTOSCOPY WITH FULGERATION Bilateral 06/17/2020   Procedure: CYSTOSCOPY,BILATERAL RETROGRADE, CLOT EVACUATION WITH FULGERATION OF THE BLADDER;  Surgeon: Selma Donnice SAUNDERS, MD;  Location: [facility] OR;  Service: Urology;  Laterality: Bilateral;   CYSTOSCOPY WITH FULGERATION N/A 06/28/2020   Procedure: CYSTOSCOPY WITH CLOT EVACUATION AND FULGERATION OF BLEEDERS;  Surgeon: Matilda Senior, MD;  Location: [facility] OR;  Service: Urology;   Laterality: N/A;  1 HR   ENTEROSCOPY N/A 06/22/2022   Procedure: ENTEROSCOPY;  Surgeon: Wilhelmenia Aloha Raddle., MD;  Location: [facility] ENDOSCOPY;  Service: Gastroenterology;  Laterality: N/A;   HEMOSTASIS CLIP PLACEMENT  06/22/2022   Procedure: HEMOSTASIS CLIP PLACEMENT;  Surgeon: Wilhelmenia Aloha Raddle., MD;  Location: [facility] ENDOSCOPY;  Service: Gastroenterology;;   HOT HEMOSTASIS N/A 06/22/2022   Procedure: HOT HEMOSTASIS (ARGON PLASMA COAGULATION/BICAP);  Surgeon: Wilhelmenia Aloha Raddle., MD;  Location: [facility] ENDOSCOPY;  Service: Gastroenterology;  Laterality: N/A;   I & D EXTREMITY Right 12/15/2018   Procedure: DEBRIDEMENT RIGHT FOOT;  Surgeon: Harden Jerona GAILS, MD;  Location: [facility] OR;  Service: Orthopedics;  Laterality: Right;   I & D EXTREMITY Right 08/27/2019   Procedure: PARTIAL CUBOID EXCISION RIGHT FOOT;  Surgeon: Harden Jerona GAILS, MD;  Location: [facility] OR;  Service: Orthopedics;  Laterality: Right;   I & D EXTREMITY Right 01/07/2020   Procedure: RIGHT FOOT EXCISION INFECTED BONE;  Surgeon:  Harden Jerona GAILS, MD;  Location: [facility] OR;  Service: Orthopedics;  Laterality: Right;   I & D EXTREMITY Right 05/17/2022   Procedure: IRRIGATION AND DEBRIDEMENT RIGHT FOOT ABSCESS;  Surgeon: Harden Jerona GAILS, MD;  Location: [facility] OR;  Service: Orthopedics;  Laterality: Right;   IR PARACENTESIS  10/11/2022   IR PARACENTESIS  12/24/2022   IR PARACENTESIS  02/06/2023   IR PARACENTESIS  05/05/2023   IR PARACENTESIS  05/19/2023   IR PARACENTESIS  07/11/2023   IR PARACENTESIS  07/30/2023   IR PARACENTESIS  08/20/2023   IR PARACENTESIS  09/19/2023   IR PARACENTESIS  10/03/2023   IR PARACENTESIS  10/17/2023   IR PARACENTESIS  10/30/2023   IR PARACENTESIS  11/19/2023   IR PARACENTESIS  12/03/2023   IR PARACENTESIS  12/16/2023   NECK SURGERY     novemver 2019   PERIPHERAL VASCULAR BALLOON ANGIOPLASTY Left 03/05/2023   Procedure: PERIPHERAL VASCULAR BALLOON ANGIOPLASTY;  Surgeon: Pearline [name] RAMAN, MD;  Location: [facility] INVASIVE CV LAB;   Service: Vascular;  Laterality: Left;  AVF   POLYPECTOMY  06/22/2022   Procedure: POLYPECTOMY;  Surgeon: Mansouraty, Aloha Raddle., MD;  Location: [facility] ENDOSCOPY;  Service: Gastroenterology;;   POLYPECTOMY  06/23/2022   Procedure: POLYPECTOMY;  Surgeon: Legrand Victory LITTIE DOUGLAS, MD;  Location: [facility] ENDOSCOPY;  Service: Gastroenterology;;   STUMP REVISION Left 06/27/2021   Procedure: REVISION LEFT BELOW KNEE AMPUTATION;  Surgeon: Harden Jerona GAILS, MD;  Location: [facility] OR;  Service: Orthopedics;  Laterality: Left;   SUBMUCOSAL LIFTING INJECTION  06/22/2022   Procedure: SUBMUCOSAL LIFTING INJECTION;  Surgeon: Wilhelmenia Aloha Raddle., MD;  Location: [facility] ENDOSCOPY;  Service: Gastroenterology;;   SUBMUCOSAL TATTOO INJECTION  06/22/2022   Procedure: SUBMUCOSAL TATTOO INJECTION;  Surgeon: Wilhelmenia Aloha Raddle., MD;  Location: [facility] ENDOSCOPY;  Service: Gastroenterology;;   TRANSURETHRAL RESECTION OF PROSTATE N/A 06/28/2020   Procedure: TRANSURETHRAL RESECTION OF THE PROSTATE (TURP);  Surgeon: Matilda Senior, MD;  Location: [facility] OR;  Service: Urology;  Laterality: N/A;   VENOUS ANGIOPLASTY  11/07/2023   Procedure: VENOUS ANGIOPLASTY;  Surgeon: Gretta Lonni PARAS, MD;  Location: [facility] PV LAB;  Service: Cardiovascular;;  Basilic Vein, Subclavian Vein   WISDOM TOOTH EXTRACTION     Social History   Tobacco Use   Smoking status: Former    Types: Cigars    Start date: 04/08/1973    Quit date: 12/24/1988    Years since quitting: 35.0   Smokeless tobacco: Never   Tobacco comments:    Cigars and Pipe   Vaping Use   Vaping status: Never Used  Substance Use Topics   Alcohol  use: Never   Drug use: Never   Family History  Problem Relation Age of Onset   Diabetes Mellitus II Mother    Kidney disease Mother    Diabetes Mellitus II Father    CAD Father    Cancer Father        prostate   Kidney disease Father    Diabetes Mellitus II Brother    Kidney disease Brother    Diabetes Mellitus II Brother    Stomach cancer  Brother 63   Kidney disease Brother    Colon cancer Neg Hx    Colon polyps Neg Hx    Esophageal cancer Neg Hx    Rectal cancer Neg Hx    Pancreatic cancer Neg Hx     Allergies:  Allergies  Allergen Reactions   Morphine  Other (See Comments)    Other reaction(s): Delusions (intolerance)  Other  Reaction(s): delusions   Mushroom Extract Complex (Obsolete) Nausea Only    Current antibiotics: Antibiotics Given (last 72 hours)     Date/Time Action Medication Dose Rate   01/08/24 1601 New Bag/Given   ceFEPIme  (MAXIPIME ) 1 g in sodium chloride  0.9 % 100 mL IVPB 1 g 200 mL/hr   01/08/24 2252 New Bag/Given   ceFEPIme  (MAXIPIME ) 2 g in sodium chloride  0.9 % 100 mL IVPB 2 g 200 mL/hr   01/08/24 2335 New Bag/Given   metroNIDAZOLE  (FLAGYL ) IVPB 500 mg 500 mg 100 mL/hr   01/09/24 0037 New Bag/Given   vancomycin  (VANCOCIN ) IVPB 1000 mg/200 mL premix 1,000 mg 200 mL/hr   01/09/24 1027 New Bag/Given   metroNIDAZOLE  (FLAGYL ) IVPB 500 mg 500 mg 100 mL/hr   01/09/24 2144 New Bag/Given   metroNIDAZOLE  (FLAGYL ) IVPB 500 mg 500 mg 100 mL/hr   01/10/24 0915 New Bag/Given   metroNIDAZOLE  (FLAGYL ) IVPB 500 mg 500 mg 100 mL/hr   01/10/24 1821 New Bag/Given   ceFEPIme  (MAXIPIME ) 2 g in sodium chloride  0.9 % 100 mL IVPB 2 g 200 mL/hr   01/10/24 1851 New Bag/Given   vancomycin  (VANCOCIN ) IVPB 1000 mg/200 mL premix 1,000 mg 200 mL/hr   01/10/24 2251 New Bag/Given   metroNIDAZOLE  (FLAGYL ) IVPB 500 mg 500 mg 100 mL/hr   01/11/24 0803 New Bag/Given   metroNIDAZOLE  (FLAGYL ) IVPB 500 mg 500 mg 100 mL/hr       MEDICATIONS:  allopurinol   200 mg Oral Daily   Chlorhexidine  Gluconate Cloth  6 each Topical Q0600   Chlorhexidine  Gluconate Cloth  6 each Topical Q0600   insulin  aspart  0-6 Units Subcutaneous TID WC   levothyroxine   112 mcg Oral Q0600   lidocaine   1 patch Transdermal Q24H   midodrine  10 mg Oral TID WC   modafinil   200 mg Oral Daily   pantoprazole   40 mg Oral Daily   rosuvastatin   10 mg  Oral Daily   sevelamer  carbonate  800 mg Oral TID WC    Review of Systems - 11 systems reviewed and negative per HPI   OBJECTIVE: Temp:  [97.7 F (36.5 C)-98.5 F (36.9 C)] 98.3 F (36.8 C) (10/05 1052) Pulse Rate:  [34-107] 66 (10/05 1052) Resp:  [12-20] 18 (10/05 1052) BP: (96-154)/(36-65) 135/58 (10/05 1052) SpO2:  [98 %-100 %] 100 % (10/05 1052) Weight:  [88.1 kg-88.7 kg] 88.7 kg (10/05 0340) Physical Exam  Constitutional: He is oriented to person, Chornically ill appearing. Slowed mentation HENT: anicteric Mouth/Throat: Oropharynx is clear and dry.  Cardiovascular: Normal rate, regular rhythm and normal heart sounds. Pulmonary/Chest: Effort normal and breath sounds normal. No respiratory distress. He has no wheezes.  Abdominal: Soft. Bowel sounds are normal. He exhibits no distension. There is no tenderness.   Neurological: He is alert and interactive but somewhat slowed mentation and speech Ext Bil BKA. See phtoos  Skin: R patella bursal swelling, warmth and ttp  L BKA stump which did not appear infected and a healing wound suerpior to L knee also not infected.  Psychiatric: He has a normal mood and affect. His behavior is normal.            LABS: Results for orders placed or performed during the hospital encounter of 01/08/24 (from the past 48 hours)  Uric acid     Status: None   Collection Time: 01/09/24  5:39 PM  Result Value Ref Range   Uric Acid, Serum 4.2 3.7 - 8.6 mg/dL    Comment:  Performed at [facility], [address] [place], [place] [address]  Glucose, capillary     Status: None   Collection Time: 01/09/24  9:43 PM  Result Value Ref Range   Glucose-Capillary 86 70 - 99 mg/dL    Comment: Glucose reference range applies only to samples taken after fasting for at least 8 hours.  Renal function panel     Status: Abnormal   Collection Time: 01/10/24  2:58 AM  Result Value Ref Range   Sodium 137 135 - 145 mmol/L   Potassium 5.1 3.5 - 5.1  mmol/L   Chloride 98 98 - 111 mmol/L   CO2 22 22 - 32 mmol/L   Glucose, Bld 98 70 - 99 mg/dL    Comment: Glucose reference range applies only to samples taken after fasting for at least 8 hours.   BUN 54 (H) 8 - 23 mg/dL   Creatinine, Ser 3.09 (H) 0.61 - 1.24 mg/dL   Calcium  7.7 (L) 8.9 - 10.3 mg/dL   Phosphorus 6.0 (H) 2.5 - 4.6 mg/dL   Albumin  2.0 (L) 3.5 - 5.0 g/dL   GFR, Estimated 8 (L) >60 mL/min    Comment: (NOTE) Calculated using the CKD-EPI Creatinine Equation (2021)    Anion gap 17 (H) 5 - 15    Comment: Performed at [facility], [address] [place], [place] [address]  CBC with Differential/Platelet     Status: Abnormal   Collection Time: 01/10/24  2:58 AM  Result Value Ref Range   WBC 11.8 (H) 4.0 - 10.5 K/uL   RBC 3.80 (L) 4.22 - 5.81 MIL/uL   Hemoglobin 10.6 (L) 13.0 - 17.0 g/dL   HCT 66.6 (L) 60.9 - 47.9 %   MCV 87.6 80.0 - 100.0 fL   MCH 27.9 26.0 - 34.0 pg   MCHC 31.8 30.0 - 36.0 g/dL   RDW 84.1 (H) 88.4 - 84.4 %   Platelets 127 (L) 150 - 400 K/uL   nRBC 0.0 0.0 - 0.2 %   Neutrophils Relative % 85 %   Neutro Abs 10.1 (H) 1.7 - 7.7 K/uL   Lymphocytes Relative 3 %   Lymphs Abs 0.4 (L) 0.7 - 4.0 K/uL   Monocytes Relative 6 %   Monocytes Absolute 0.7 0.1 - 1.0 K/uL   Eosinophils Relative 1 %   Eosinophils Absolute 0.1 0.0 - 0.5 K/uL   Basophils Relative 1 %   Basophils Absolute 0.1 0.0 - 0.1 K/uL   Immature Granulocytes 4 %   Abs Immature Granulocytes 0.51 (H) 0.00 - 0.07 K/uL    Comment: Performed at [facility], [address], [place], [place] [address]  Glucose, capillary     Status: None   Collection Time: 01/10/24  6:20 AM  Result Value Ref Range   Glucose-Capillary 81 70 - 99 mg/dL    Comment: Glucose reference range applies only to samples taken after fasting for at least 8 hours.  Glucose, capillary     Status: None   Collection Time: 01/10/24 12:07 PM  Result Value Ref Range   Glucose-Capillary 90 70 - 99 mg/dL    Comment:  Glucose reference range applies only to samples taken after fasting for at least 8 hours.  Glucose, capillary     Status: Abnormal   Collection Time: 01/10/24  6:34 PM  Result Value Ref Range   Glucose-Capillary 65 (L) 70 - 99 mg/dL    Comment: Glucose reference range applies only to samples  taken after fasting for at least 8 hours.  Glucose, capillary     Status: Abnormal   Collection Time: 01/10/24  6:48 PM  Result Value Ref Range   Glucose-Capillary 113 (H) 70 - 99 mg/dL    Comment: Glucose reference range applies only to samples taken after fasting for at least 8 hours.  Glucose, capillary     Status: Abnormal   Collection Time: 01/10/24  9:36 PM  Result Value Ref Range   Glucose-Capillary 106 (H) 70 - 99 mg/dL    Comment: Glucose reference range applies only to samples taken after fasting for at least 8 hours.  Magnesium      Status: None   Collection Time: 01/11/24  2:56 AM  Result Value Ref Range   Magnesium  2.1 1.7 - 2.4 mg/dL    Comment: Performed at [facility], [address] [place], [place] [address]  Glucose, capillary     Status: None   Collection Time: 01/11/24  6:17 AM  Result Value Ref Range   Glucose-Capillary 92 70 - 99 mg/dL    Comment: Glucose reference range applies only to samples taken after fasting for at least 8 hours.  Glucose, capillary     Status: None   Collection Time: 01/11/24 10:51 AM  Result Value Ref Range   Glucose-Capillary 95 70 - 99 mg/dL    Comment: Glucose reference range applies only to samples taken after fasting for at least 8 hours.  Glucose, capillary     Status: Abnormal   Collection Time: 01/11/24 11:52 AM  Result Value Ref Range   Glucose-Capillary 102 (H) 70 - 99 mg/dL    Comment: Glucose reference range applies only to samples taken after fasting for at least 8 hours.   *Note: Due to a large number of results and/or encounters for the requested time period, some results have not been displayed. A complete set of results  can be found in Results Review.   No components found for: ESR, C REACTIVE PROTEIN MICRO: Recent Results (from the past 720 hours)  Culture, blood (Routine x 2)     Status: None (Preliminary result)   Collection Time: 01/08/24 12:46 AM   Specimen: BLOOD RIGHT ARM  Result Value Ref Range Status   Specimen Description BLOOD RIGHT ARM  Final   Special Requests   Final    BOTTLES DRAWN AEROBIC AND ANAEROBIC Blood Culture adequate volume   Culture   Final    NO GROWTH 3 DAYS Performed at [facility], [address] [place], [place] [address]    Report Status PENDING  Incomplete  Culture, blood (Routine x 2)     Status: None (Preliminary result)   Collection Time: 01/08/24 12:46 AM   Specimen: BLOOD RIGHT ARM  Result Value Ref Range Status   Specimen Description BLOOD RIGHT ARM  Final   Special Requests   Final    BOTTLES DRAWN AEROBIC AND ANAEROBIC Blood Culture results may not be optimal due to an inadequate volume of blood received in culture bottles   Culture   Final    NO GROWTH 3 DAYS Performed at [facility], [address] [place], [place] [address]    Report Status PENDING  Incomplete  Body fluid culture w Gram Stain     Status: None   Collection Time: 01/08/24  1:51 AM   Specimen: KNEE; Body Fluid  Result Value Ref Range Status   Specimen Description KNEE  Final   Special  Requests NONE  Final   Gram Stain   Final    RARE WBC PRESENT, PREDOMINANTLY PMN NO ORGANISMS SEEN    Culture   Final    NO GROWTH 3 DAYS Performed at [facility], [address] [place], [place] [address]    Report Status 01/11/2024 FINAL  Final  Resp panel by RT-PCR (RSV, Flu A&B, Covid) KNEE     Status: None   Collection Time: 01/08/24  1:51 AM   Specimen: KNEE; Nasal Swab  Result Value Ref Range Status   SARS Coronavirus 2 by RT PCR NEGATIVE NEGATIVE Final   Influenza A by PCR NEGATIVE NEGATIVE Final   Influenza B by PCR NEGATIVE NEGATIVE Final    Comment:  (NOTE) The Xpert Xpress SARS-CoV-2/FLU/RSV plus assay is intended as an aid in the diagnosis of influenza from Nasopharyngeal swab specimens and should not be used as a sole basis for treatment. Nasal washings and aspirates are unacceptable for Xpert Xpress SARS-CoV-2/FLU/RSV testing.  Fact Sheet for Patients: BloggerCourse.com  Fact Sheet for Healthcare Providers: SeriousBroker.it  This test is not yet approved or cleared by the [place]  FDA and has been authorized for detection and/or diagnosis of SARS-CoV-2 by FDA under an Emergency Use Authorization (EUA). This EUA will remain in effect (meaning this test can be used) for the duration of the COVID-19 declaration under Section 564(b)(1) of the Act, 21 U.S.C. section 360bbb-3(b)(1), unless the authorization is terminated or revoked.     Resp Syncytial Virus by PCR NEGATIVE NEGATIVE Final    Comment: (NOTE) Fact Sheet for Patients: BloggerCourse.com  Fact Sheet for Healthcare Providers: SeriousBroker.it  This test is not yet approved or cleared by the [place]  FDA and has been authorized for detection and/or diagnosis of SARS-CoV-2 by FDA under an Emergency Use Authorization (EUA). This EUA will remain in effect (meaning this test can be used) for the duration of the COVID-19 declaration under Section 564(b)(1) of the Act, 21 U.S.C. section 360bbb-3(b)(1), unless the authorization is terminated or revoked.  Performed at [facility], [address], [place], [place] [address]     IMAGING: ECHOCARDIOGRAM COMPLETE Result Date: 01/08/2024    ECHOCARDIOGRAM REPORT   Patient Name:   [name] Date of Exam: 01/08/2024 Medical Rec #:  [id number]    Height:       71.0 in Accession #:    [id number]   Weight:       210.0 lb Date of Birth:  1950/08/28     BSA:          2.153 m Patient Age:    73 years     BP:            105/54 mmHg Patient Gender: M            HR:           76 bpm. Exam Location:  Inpatient Procedure: 2D Echo (Both Spectral and Color Flow Doppler were utilized during            procedure). Indications:    CHF  History:        Patient has no prior history of Echocardiogram examinations.                 CHF.  Sonographer:    Norleen Amour Referring Phys: [phone] STEVEN J NEWTON IMPRESSIONS  1. Left ventricular ejection fraction, by estimation, is 55 to 60%. Left ventricular ejection fraction by 2D MOD biplane is  59.3 %. The left ventricle has normal function. The left ventricle has no regional wall motion abnormalities. Left ventricular diastolic function could not be evaluated. There is the interventricular septum is flattened in systole and diastole, consistent with right ventricular pressure and volume overload.  2. Right ventricular systolic function is severely reduced. The right ventricular size is normal. There is moderately elevated pulmonary artery systolic pressure. The estimated right ventricular systolic pressure is 53.4 mmHg.  3. Left atrial size was mildly dilated.  4. The mitral valve is grossly normal. Trivial mitral valve regurgitation.  5. The aortic valve has an indeterminant number of cusps. Aortic valve regurgitation is not visualized. Aortic valve sclerosis/calcification is present, without any evidence of aortic stenosis.  6. The inferior vena cava is dilated in size with <50% respiratory variability, suggesting right atrial pressure of 15 mmHg. Comparison(s): No prior Echocardiogram. FINDINGS  Left Ventricle: Left ventricular ejection fraction, by estimation, is 55 to 60%. Left ventricular ejection fraction by 2D MOD biplane is 59.3 %. The left ventricle has normal function. The left ventricle has no regional wall motion abnormalities. The left ventricular internal cavity size was normal in size. There is no left ventricular hypertrophy. The interventricular septum is flattened in systole and  diastole, consistent with right ventricular pressure and volume overload. Left ventricular diastolic function could not be evaluated due to atrial fibrillation. Left ventricular diastolic function could not be evaluated. Right Ventricle: The right ventricular size is normal. No increase in right ventricular wall thickness. Right ventricular systolic function is severely reduced. There is moderately elevated pulmonary artery systolic pressure. The tricuspid regurgitant velocity is 3.10 m/s, and with an assumed right atrial pressure of 15 mmHg, the estimated right ventricular systolic pressure is 53.4 mmHg. Left Atrium: Left atrial size was mildly dilated. Right Atrium: Right atrial size was normal in size. Pericardium: There is no evidence of pericardial effusion. Mitral Valve: The mitral valve is grossly normal. Trivial mitral valve regurgitation. Tricuspid Valve: The tricuspid valve is grossly normal. Tricuspid valve regurgitation is trivial. Aortic Valve: The aortic valve has an indeterminant number of cusps. Aortic valve regurgitation is not visualized. Aortic valve sclerosis/calcification is present, without any evidence of aortic stenosis. Aortic valve mean gradient measures 5.0 mmHg. Aortic valve peak gradient measures 10.2 mmHg. Aortic valve area, by VTI measures 1.56 cm. Pulmonic Valve: The pulmonic valve was grossly normal. Pulmonic valve regurgitation is trivial. Aorta: The aortic root and ascending aorta are structurally normal, with no evidence of dilitation. Venous: The inferior vena cava is dilated in size with less than 50% respiratory variability, suggesting right atrial pressure of 15 mmHg. IAS/Shunts: The interatrial septum is aneurysmal. No atrial level shunt detected by color flow Doppler.  LEFT VENTRICLE PLAX 2D                        Biplane EF (MOD) LVIDd:         4.10 cm         LV Biplane EF:   Left LVIDs:         2.60 cm                          ventricular LV PW:         1.10 cm                           ejection LV IVS:  0.90 cm                          fraction by LVOT diam:     2.00 cm                          2D MOD LV SV:         54                               biplane is LV SV Index:   25                               59.3 %. LVOT Area:     3.14 cm                                Diastology                                LV e' medial:    9.14 cm/s LV Volumes (MOD)               LV E/e' medial:  16.7 LV vol d, MOD    98.1 ml       LV e' lateral:   14.00 cm/s A2C:                           LV E/e' lateral: 10.9 LV vol d, MOD    84.5 ml A4C: LV vol s, MOD    46.1 ml A2C: LV vol s, MOD    27.5 ml A4C: LV SV MOD A2C:   52.0 ml LV SV MOD A4C:   84.5 ml LV SV MOD BP:    55.0 ml RIGHT VENTRICLE            IVC RV Basal diam:  3.60 cm    IVC diam: 2.00 cm RV S prime:     3.49 cm/s TAPSE (M-mode): 0.8 cm LEFT ATRIUM             Index        RIGHT ATRIUM           Index LA diam:        4.90 cm 2.28 cm/m   RA Area:     14.00 cm LA Vol (A2C):   77.7 ml 36.09 ml/m  RA Volume:   33.30 ml  15.47 ml/m LA Vol (A4C):   73.2 ml 34.00 ml/m LA Biplane Vol: 77.8 ml 36.14 ml/m  AORTIC VALVE                     PULMONIC VALVE AV Area (Vmax):    1.47 cm      PV Vmax:       1.08 m/s AV Area (Vmean):   1.45 cm      PV Peak grad:  4.7 mmHg AV Area (VTI):     1.56 cm AV Vmax:           160.00 cm/s AV Vmean:          105.000 cm/s AV VTI:  0.348 m AV Peak Grad:      10.2 mmHg AV Mean Grad:      5.0 mmHg LVOT Vmax:         75.00 cm/s LVOT Vmean:        48.300 cm/s LVOT VTI:          0.173 m LVOT/AV VTI ratio: 0.50  AORTA Ao Root diam: 3.20 cm Ao Asc diam:  3.10 cm MITRAL VALVE                TRICUSPID VALVE MV Area (PHT): 5.02 cm     TR Peak grad:   38.4 mmHg MV Decel Time: 151 msec     TR Vmax:        310.00 cm/s MV E velocity: 153.00 cm/s MV A velocity: 57.70 cm/s   SHUNTS MV E/A ratio:  2.65         Systemic VTI:  0.17 m                             Systemic Diam: 2.00 cm Vinie Maxcy MD  Electronically signed by Vinie Maxcy MD Signature Date/Time: 01/08/2024/2:51:37 PM    Final    CT CHEST ABDOMEN PELVIS WO CONTRAST Result Date: 01/08/2024 CLINICAL DATA:  Sepsis. EXAM: CT CHEST, ABDOMEN AND PELVIS WITHOUT CONTRAST TECHNIQUE: Multidetector CT imaging of the chest, abdomen and pelvis was performed following the standard protocol without IV contrast. RADIATION DOSE REDUCTION: This exam was performed according to the departmental dose-optimization program which includes automated exposure control, adjustment of the mA and/or kV according to patient size and/or use of iterative reconstruction technique. COMPARISON:  05/04/2023 FINDINGS: CT CHEST FINDINGS Cardiovascular: The heart is enlarged. Coronary artery calcification is evident. Moderate atherosclerotic calcification is noted in the wall of the thoracic aorta. Enlargement of the pulmonary outflow tract/main pulmonary arteries suggests pulmonary arterial hypertension. Mediastinum/Nodes: No mediastinal lymphadenopathy. No evidence for gross hilar lymphadenopathy although assessment is limited by the lack of intravenous contrast on the current study. The esophagus has normal imaging features. There is no axillary lymphadenopathy. Lungs/Pleura: Fine architectural detail of lung parenchyma obscured by breathing motion. Within this limitation there is no pneumothorax or substantial pleural effusion. No dense focal consolidative airspace disease. Tiny calcified granuloma noted left lower lobe. Subsegmental atelectasis identified in the lingula and both lower lobes. The patchy nodular airspace disease seen previously in the lingula and both lower lobes appears to have resolved although again the motion artifact hinders assessment. Musculoskeletal: No worrisome lytic or sclerotic osseous abnormality. CT ABDOMEN PELVIS FINDINGS Hepatobiliary: No suspicious focal abnormality in the liver on this study without intravenous contrast. Nodularity of liver  contour consistent with cirrhosis. Cholecystectomy. No intrahepatic or extrahepatic biliary dilation. Pancreas: No focal mass lesion. No dilatation of the main duct. No intraparenchymal cyst. No peripancreatic edema. Spleen: No splenomegaly. No suspicious focal mass lesion. Adrenals/Urinary Tract: 12 mm left adrenal nodule is likely an adenoma. 3.9 cm right adrenal mass is mildly heterogeneous with some areas demonstrating low attenuation suggesting adenoma this lesion has been present since 2016 when it measured 3 cm and demonstrated more substantial macroscopic fat density, features suggesting benign etiology. 3.9 x 3.2 cm relatively low-density lesion upper pole right kidney is similar to prior and potentially a cyst given the relative stability over multiple studies. Both kidneys are atrophic. No evidence for hydroureteronephrosis. Bladder is decompressed. Stomach/Bowel: Stomach is unremarkable. No gastric wall thickening. No evidence of outlet obstruction.  Duodenum is normally positioned as is the ligament of Treitz. Hemostatic clip(s) identified in the duodenal lumen. No small bowel wall thickening. No small bowel dilatation. The terminal ileum is normal. The appendix is normal. No gross colonic mass. No colonic wall thickening. Diverticular changes are noted in the left colon without evidence of diverticulitis. Vascular/Lymphatic: There is advanced atherosclerotic calcification of the abdominal aorta without aneurysm. There is no gastrohepatic or hepatoduodenal ligament lymphadenopathy. Mild retroperitoneal lymphadenopathy appears progressive in the interval. 14 mm short axis left para-aortic node identified on 83/3. 11 mm short axis retrocaval node on 79/3 (previously about 7 mm short axis). 11 mm short axis right common iliac node on 93/3 was 7 mm previously. 11 mm short axis right external iliac node identified on 01/16/3. 13 mm short axis right groin node on 136/3 was 8 mm previously. Reproductive:  Fiducial markers are identified in the prostate gland.Fluid in the scrotum suggests hydrocele with some probable edema in the soft tissues of the penis. Other: Moderate to large volume ascites is similar to prior. Diffuse body wall edema evident. Musculoskeletal: Right groin hernia contains free fluid. No worrisome lytic or sclerotic osseous abnormality. Advanced degenerative disc disease noted lumbar spine IMPRESSION: 1. No acute findings in the chest. The patchy nodular airspace disease seen previously in the lingula and both lower lobes appears to have resolved although again the motion artifact hinders assessment for complete resolution. 2. Enlargement of the pulmonary outflow tract/main pulmonary arteries suggests pulmonary arterial hypertension. 3. Cirrhosis with moderate to large volume ascites, similar to prior. 4. Mild retroperitoneal and right pelvic sidewall lymphadenopathy appears progressive in the interval. This is nonspecific and may be reactive. Close follow-up recommended. 5. Fluid in the scrotum suggests hydrocele with some probable edema in the soft tissues of the penis. 6. Right groin hernia contains free fluid. 7.  Aortic Atherosclerosis (ICD10-I70.0). Electronically Signed   By: Camellia Candle M.D.   On: 01/08/2024 05:48   DG Chest Port 1 View if patient is in a treatment room. Result Date: 01/08/2024 EXAM: 1 VIEW(S) XRAY OF THE CHEST 01/08/2024 12:57:20 AM COMPARISON: Comparison with 10/31/2023. CLINICAL HISTORY: Suspected Sepsis. FINDINGS: LUNGS AND PLEURA: Low lung volumes accentuate pulmonary vascularity. No focal pulmonary opacity. No pulmonary edema. No pleural effusion. No pneumothorax. HEART AND MEDIASTINUM: Low lung volumes accentuate cardiomediastinal silhouette. Sternotomy and CABG. No acute abnormality of the cardiac and mediastinal silhouettes. BONES AND SOFT TISSUES: Sternotomy. No acute osseous abnormality. IMPRESSION: 1. No acute findings. Electronically signed by: [name] Gatlin MD 01/08/2024 01:03 AM EDT RP Workstation: [id number]   IR Paracentesis Result Date: 12/16/2023 INDICATION: Patient with a history of end-stage renal disease with recurrent ascites. Interventional Radiology asked to perform a therapeutic paracentesis. EXAM: ULTRASOUND GUIDED PARACENTESIS MEDICATIONS: 1% lidocaine  10 mL COMPLICATIONS: None immediate. PROCEDURE: Informed written consent was obtained from the patient after a discussion of the risks, benefits and alternatives to treatment. A timeout was performed prior to the initiation of the procedure. Initial ultrasound scanning demonstrates a large amount of ascites within the left lower abdominal quadrant. The left lower abdomen was prepped and draped in the usual sterile fashion. 1% lidocaine  was used for local anesthesia. Following this, a 19 gauge, 7-cm, Yueh catheter was introduced. An ultrasound image was saved for documentation purposes. The paracentesis was performed. The catheter was removed and a dressing was applied. The patient tolerated the procedure well without immediate post procedural complication. FINDINGS: A total of approximately 7.1 L of clear yellow fluid was removed.  IMPRESSION: Successful ultrasound-guided paracentesis yielding 7.1 liters of peritoneal fluid. Procedure performed by: Warren Dais, NP Electronically Signed   By: Juliene Balder M.D.   On: 12/16/2023 15:17    Assessment:   [name] is a 73 y.o. male with past medical history of ESRD on HD, permanent atrial fibrillation not on AC due to GI bleed, chronic anemia, CAD status post CABG in 2005, DMT2, PAD status post bilateral BKA, and recurrent ascites requiring frequent paracentesis who presented to the ED on 01/08/2024 with altered mental status and knee pain. He was  admitted with SIRS, hypotension, encephalopathy, ascites, and right knee pain. On admit wbc 15, lactate 2.2 T 100.1. Right knee synovial fluid obtained in the ED showed 3 WBCs and no crystals. CX ngtd  BCX neg.  CT abd with mult chronic findings including ascites and LAN but nothing acute Has a L BKA stump which did not appear infected and a healing wound suerpior to L knee also not infected. Clinically improving on vancomycin  and cefepime  and metronidazole   Multiple possible causes of his septic picture which is improving. No evidence SBP on exam despite ascites which is chronic.  I suspect main issue is R knee bursitis likely infectious.   Recommendations Cont vanco cefepime  Consult ortho to eval for  R knee septic bursitis and aspirate if appropriate.  Thank you very much for allowing me to participate in the care of this patient. Please call with questions.   Alm SQUIBB. Epifanio, MD

## 2024-01-11 NOTE — Consult Note (Signed)
 Orthopedic Consultation Note  Current Hospital Day : Hospital Day: 4  Reason For Consult: Concern for right knee septic prepatellar bursitis  History of Present Illness:  PAOLA FLYNT is a 73 y.o. male who has extensive medical history including ESRD on HD, diabetes, status post bilateral BKA who was admitted for SIRS, right knee cellulitis and encephalopathy.  He has been improving on empiric antibiotics.  Aspiration of the knee performed in the emergency department on presentation was not concerning for septic arthritis.  Presently he endorses mild to moderate knee pain.  He is able to move the knee  Physical Examination Right lower extremity: Previously healed BKA No pain with passive or active short arc range of motion of the knee Erythema over the patella with a small palpable fluid collection Skin is warm and well-perfused  Assessment:   ETHER WOLTERS is a 73 y.o. male with right knee prepatellar septic bursitis.  Previous aspiration of his knee was not concerning for septic arthritis.  I discussed potential aspiration to target his antibiotics on culture and he was on board with this.  2 cc of purulent fluid were aspirated from the prepatellar septic bursa  Presently I do not think that surgical I&D is needed given the very small volume of fluid and improvement on antibiotics.  Plan:   No surgical plans Continue management with IV antibiotics Cultures from bursa aspiration pending

## 2024-01-11 NOTE — Progress Notes (Signed)
 PROGRESS NOTE    [name]  FMW:[id number] DOB: 28-Sep-1950 DOA: 01/08/2024 PCP: [name], NP  Outpatient Specialists:     Brief Narrative:  Patient is a 73 year old male with multiple medical and cardiac history.  Patient carries diagnosis of gout, hypertension, diabetes mellitus, prior history of CABG and ESRD on hemodialysis TTS.  Patient is s/p bilateral BKA.  Patient was admitted with SIRS, encephalopathy, right knee pain with associated redness of the skin.  Patient underwent arthroscopy of the right knee at the emergency room and analysis was negative for crystals, with WBC of 3.  Cultures have remained negative to date.  Patient is currently on IV vancomycin , Flagyl  and cefepime .  Uric acid was 4.2.  Follow final cultures.  01/11/2024: Infectious disease team was consulted to assist with patient's management.  Impression of prepatellar septic bursitis has been made.  Orthopedic team has been consulted to assist in draining the prepatellar bursa.  Samples obtained will be sent for further analysis.  Patient will continue on vancomycin  and cefepime  only.  Flagyl  will be discontinued.  Patient was seen alongside patient's wife.  Patient continues to improve clinically.  Encephalopathy is also improving.     Assessment & Plan:   Principal Problem:   SIRS (systemic inflammatory response syndrome) (HCC) Active Problems:   Essential hypertension   CAD (coronary artery disease)   Insulin  dependent type 2 diabetes mellitus (HCC)   ESRD on dialysis (HCC)   Pressure injury of skin   Anemia of chronic disease   Permanent atrial fibrillation (HCC)   Thrombocytopenia   Encephalopathy   Ascites   Gastroesophageal reflux disease without esophagitis   Hypotension   (HFpEF) heart failure with preserved ejection fraction (HCC)   Right knee pain    SIRS (systemic inflammatory response syndrome) (HCC)/cellulitis around right knee area/right prepatellar septic bursitis: - Patient  met borderline SIRS criteria on presentation, with noted Tmax 100.1, respiration in the 20s, white count of near 12 -Confounding issues include ESRD as well as systolic pressures in the 80s Lactate of 2-2.2 -Noted right knee swelling on initial presentation with fairly normal fluid analysis -Chest x-ray stable -CT chest abdomen pelvis with otherwise stable findings -Started on empiric Rocephin  and vancomycin  in the ER.  Broadened to cefepime , vanc and flagyl  by the admitting team. - Synovial fluid analysis has been negative. - Uric acid of 4.2. - Infectious disease team was consulted to assist with patient's management.  See above documentation.  Right prepatellar septic bursitis diagnosis has been made.  Orthopedic team has been consulted, Dr. Dale Hock for possible drainage and analysis of sample. - Continue IV cefepime  and vancomycin . - Discontinue Flagyl . - Cellulitic changes have continued to improve. - Encephalopathy has resolved significantly.    Hypotension -On presentation, SBP in the 80s despite fluid bolus in setting of SIRS  -Suspect multifactorial w/ contribution of SIRS, medication, ESRD, cirrhosis w/ third spacing, dCHF. -Negative right knee fluid analysis. Continue midodrine 10 mg p.o. 3 times daily (as needed). Systolic blood pressure improved to 130+ millimeters mercury.   Continue to monitor closely    Encephalopathy, possibly combined toxic and metabolic: -Suspect multifactorial with contributions of SIRS, ESRD, hypotension BUN 58 -Current working diagnosis of possible right prepatellar septic bursitis -Ammonia level came back normal.   Permanent atrial fibrillation (HCC) -Rate controlled atrial fibrillation on presentation  -Not an anticoagulation in setting of hx/o GU bleeding    Ascites -Recurrent ascites on CT  -Plan for paracentesis as hemodynamics tolerate  Follow    Right knee pain -Right knee pain and swelling on presentation in setting of SIRS  and hypotension S/p arthocentesis- Non suggestive for infection or crystals  -Patient is currently on IV vancomycin , cefepime  and Flagyl . - Uric acid of 4.2.  SABRA - Cellulitic changes around the right knee has improved significantly.   - Patient has history of gout. - Infectious disease team consulted today.  Possible right prepatellar septic bursitis impression admitted.  Orthopedic team consulted for possible drainage and fluid analysis.  Antibiotics streamlined to IV vancomycin  and cefepime .     ESRD on dialysis [facility])  Continue hemodialysis TTS.   CAD (coronary artery disease) Baseline history of CAD status post CABG Stable EKG stable Monitor   Insulin  dependent type 2 diabetes mellitus (HCC) Blood sugar has improved. Continue to hold on SSI for now  Dextrose  infused fluids as clinically indicated   Anemia of chronic disease Last hemoglobin of 10.6 g/dL.   Near baseline  No overt signs of bleeding at present  Monitor    Thrombocytopenia Platelet count of 127 today. Monitor   Essential hypertension/hypotension -Antihypertensives are on hold. - Continue to monitor closely    (HFpEF) heart failure with preserved ejection fraction (HCC) 2D ECHO 11/2022 with EF 60-65%, grade 1 mitral regurgitation, moderate TR, mild PH + volume overload and hypotension on presentation in setting of SIRS, ESRD, recurrent ascites   01/09/2024: Repeat echo revealed ejection fraction of 55 to 60%, left ventricular diastolic function could not be evaluated, interventricular septum is flattened in systole and diastole, consistent with right ventricular pressure and volume overload, dilated inferior vena cava with less than 50% respiratory variability.  Patient is on hemodialysis TTS.   Gastroesophageal reflux disease without esophagitis PPI     DVT prophylaxis:  Code Status: Full code Family Communication: Wife by bedside.  Disposition Plan: Inpatient.   Consultants:  Infectious  disease. Orthopedic surgery team.  Procedures:  Arthrocentesis of left knee and fluid aspiration done by the ER provider  Antimicrobials:  IV vancomycin . IV Flagyl  discontinued today, 01/11/2024. IV cefepime .   Subjective: Seen alongside patient's wife. Encephalopathy is improving. No significant history from patient.    Objective: Vitals:   01/11/24 0515 01/11/24 0747 01/11/24 1052 01/11/24 1645  BP:  (!) 142/49 (!) 135/58 134/60  Pulse:  67 66 (!) 58  Resp:  20 18 18   Temp: 98.5 F (36.9 C) 98.2 F (36.8 C) 98.3 F (36.8 C) 98 F (36.7 C)  TempSrc: Oral Oral Oral Oral  SpO2:  100% 100% 100%  Weight:      Height:        Intake/Output Summary (Last 24 hours) at 01/11/2024 1750 Last data filed at 01/11/2024 0400 Gross per 24 hour  Intake 100 ml  Output --  Net 100 ml   Filed Weights   01/10/24 1406 01/10/24 1748 01/11/24 0340  Weight: 90.4 kg 88.1 kg 88.7 kg    Examination:  General exam: Appears calm and comfortable.  Bilateral below-knee amputation.  Respiratory system: Clear to auscultation. Respiratory effort normal. Cardiovascular system: S1 & S2, systolic murmur with query increased intensity of S2 component of the heart sound.   Gastrointestinal system: Abdomen is soft and nontender  Central nervous system: Alert and oriented.  Extremities: Bilateral BKA  Data Reviewed: I have personally reviewed following labs and imaging studies  CBC: Recent Labs  Lab 01/08/24 0046 01/08/24 0541 01/10/24 0258  WBC 11.6* 15.1* 11.8*  NEUTROABS 10.6* 13.5* 10.1*  HGB 12.0*  10.5* 10.6*  HCT 38.0* 34.2* 33.3*  MCV 88.8 91.4 87.6  PLT 148* 141* 127*   Basic Metabolic Panel: Recent Labs  Lab 01/08/24 0046 01/08/24 0541 01/10/24 0258 01/11/24 0256  NA 136 134* 137  --   K 5.7* 4.6 5.1  --   CL 99 96* 98  --   CO2 22 21* 22  --   GLUCOSE 76 101* 98  --   BUN 58* 55* 54*  --   CREATININE 6.73* 6.47* 6.90*  --   CALCIUM  7.3* 6.6* 7.7*  --   MG  --  1.0*   --  2.1  PHOS  --  5.2* 6.0*  --    GFR: Estimated Creatinine Clearance: 10 mL/min (A) (by C-G formula based on SCr of 6.9 mg/dL (H)). Liver Function Tests: Recent Labs  Lab 01/08/24 0046 01/08/24 0541 01/10/24 0258  AST 22  --   --   ALT 12  --   --   ALKPHOS 165*  --   --   BILITOT 1.0  --   --   PROT 4.9*  --   --   ALBUMIN  2.3* 1.7* 2.0*   No results for input(s): LIPASE, AMYLASE in the last 168 hours. Recent Labs  Lab 01/08/24 1025  AMMONIA 31   Coagulation Profile: Recent Labs  Lab 01/08/24 0046  INR 1.1   Cardiac Enzymes: No results for input(s): CKTOTAL, CKMB, CKMBINDEX, TROPONINI in the last 168 hours. BNP (last 3 results) Recent Labs    10/28/23 2109  PROBNP >35,000.0*   HbA1C: No results for input(s): HGBA1C in the last 72 hours. CBG: Recent Labs  Lab 01/10/24 2136 01/11/24 0617 01/11/24 1051 01/11/24 1152 01/11/24 1633  GLUCAP 106* 92 95 102* 92   Lipid Profile: No results for input(s): CHOL, HDL, LDLCALC, TRIG, CHOLHDL, LDLDIRECT in the last 72 hours. Thyroid  Function Tests: No results for input(s): TSH, T4TOTAL, FREET4, T3FREE, THYROIDAB in the last 72 hours. Anemia Panel: No results for input(s): VITAMINB12, FOLATE, FERRITIN, TIBC, IRON, RETICCTPCT in the last 72 hours. Urine analysis:    Component Value Date/Time   COLORURINE YELLOW 07/22/2022 2356   APPEARANCEUR CLEAR 07/22/2022 2356   LABSPEC 1.022 07/22/2022 2356   PHURINE 6.5 07/22/2022 2356   GLUCOSEU NEGATIVE 07/22/2022 2356   HGBUR SMALL (A) 07/22/2022 2356   BILIRUBINUR NEGATIVE 07/22/2022 2356   KETONESUR NEGATIVE 07/22/2022 2356   PROTEINUR >300 (A) 07/22/2022 2356   UROBILINOGEN 0.2 12/28/2014 1031   NITRITE NEGATIVE 07/22/2022 2356   LEUKOCYTESUR TRACE (A) 07/22/2022 2356   Sepsis Labs: @LABRCNTIP (procalcitonin:4,lacticidven:4)  ) Recent Results (from the past 240 hours)  Culture, blood (Routine x 2)     Status:  None (Preliminary result)   Collection Time: 01/08/24 12:46 AM   Specimen: BLOOD RIGHT ARM  Result Value Ref Range Status   Specimen Description BLOOD RIGHT ARM  Final   Special Requests   Final    BOTTLES DRAWN AEROBIC AND ANAEROBIC Blood Culture adequate volume   Culture   Final    NO GROWTH 3 DAYS Performed at [facility], [address] [place], [place] [address]    Report Status PENDING  Incomplete  Culture, blood (Routine x 2)     Status: None (Preliminary result)   Collection Time: 01/08/24 12:46 AM   Specimen: BLOOD RIGHT ARM  Result Value Ref Range Status   Specimen Description BLOOD RIGHT ARM  Final   Special Requests   Final  BOTTLES DRAWN AEROBIC AND ANAEROBIC Blood Culture results may not be optimal due to an inadequate volume of blood received in culture bottles   Culture   Final    NO GROWTH 3 DAYS Performed at [facility], [address], [place], [place] [address]    Report Status PENDING  Incomplete  Body fluid culture w Gram Stain     Status: None   Collection Time: 01/08/24  1:51 AM   Specimen: KNEE; Body Fluid  Result Value Ref Range Status   Specimen Description KNEE  Final   Special Requests NONE  Final   Gram Stain   Final    RARE WBC PRESENT, PREDOMINANTLY PMN NO ORGANISMS SEEN    Culture   Final    NO GROWTH 3 DAYS Performed at [facility], [address], [place], [place] [address]    Report Status 01/11/2024 FINAL  Final  Resp panel by RT-PCR (RSV, Flu A&B, Covid) KNEE     Status: None   Collection Time: 01/08/24  1:51 AM   Specimen: KNEE; Nasal Swab  Result Value Ref Range Status   SARS Coronavirus 2 by RT PCR NEGATIVE NEGATIVE Final   Influenza A by PCR NEGATIVE NEGATIVE Final   Influenza B by PCR NEGATIVE NEGATIVE Final    Comment: (NOTE) The Xpert Xpress SARS-CoV-2/FLU/RSV plus assay is intended as an aid in the diagnosis of influenza from Nasopharyngeal swab specimens and should not be used as a sole basis for  treatment. Nasal washings and aspirates are unacceptable for Xpert Xpress SARS-CoV-2/FLU/RSV testing.  Fact Sheet for Patients: BloggerCourse.com  Fact Sheet for Healthcare Providers: SeriousBroker.it  This test is not yet approved or cleared by the [place]  FDA and has been authorized for detection and/or diagnosis of SARS-CoV-2 by FDA under an Emergency Use Authorization (EUA). This EUA will remain in effect (meaning this test can be used) for the duration of the COVID-19 declaration under Section 564(b)(1) of the Act, 21 U.S.C. section 360bbb-3(b)(1), unless the authorization is terminated or revoked.     Resp Syncytial Virus by PCR NEGATIVE NEGATIVE Final    Comment: (NOTE) Fact Sheet for Patients: BloggerCourse.com  Fact Sheet for Healthcare Providers: SeriousBroker.it  This test is not yet approved or cleared by the [place]  FDA and has been authorized for detection and/or diagnosis of SARS-CoV-2 by FDA under an Emergency Use Authorization (EUA). This EUA will remain in effect (meaning this test can be used) for the duration of the COVID-19 declaration under Section 564(b)(1) of the Act, 21 U.S.C. section 360bbb-3(b)(1), unless the authorization is terminated or revoked.  Performed at [name], [address], [place], [place] [address]          Radiology Studies: No results found.       Scheduled Meds:  allopurinol   200 mg Oral Daily   Chlorhexidine  Gluconate Cloth  6 each Topical Q0600   Chlorhexidine  Gluconate Cloth  6 each Topical Q0600   insulin  aspart  0-6 Units Subcutaneous TID WC   levothyroxine   112 mcg Oral Q0600   lidocaine   1 patch Transdermal Q24H   midodrine  10 mg Oral TID WC   modafinil   200 mg Oral Daily   pantoprazole   40 mg Oral Daily   rosuvastatin   10 mg Oral Daily   sevelamer  carbonate  800 mg Oral TID WC    Continuous Infusions:  ceFEPime  (MAXIPIME ) IV 2 g (01/10/24 1821)   vancomycin  1,000 mg (01/10/24  1851)     LOS: 3 days    Time spent: 55 minutes.    Leatrice Chapel, MD  [facility] Pager #: [phone] 7PM-7AM contact night coverage as above

## 2024-01-11 NOTE — Plan of Care (Signed)

## 2024-01-11 NOTE — Progress Notes (Signed)
 [facility] Progress Note   Subjective:   Patient seen and examined in room.  No specific complaints.    Objective Vitals:   01/11/24 0340 01/11/24 0515 01/11/24 0747 01/11/24 1052  BP: (!) 142/65  (!) 142/49 (!) 135/58  Pulse: 65  67 66  Resp: 17  20 18   Temp: 98.2 F (36.8 C) 98.5 F (36.9 C) 98.2 F (36.8 C) 98.3 F (36.8 C)  TempSrc: Oral Oral Oral Oral  SpO2: 100%  100% 100%  Weight: 88.7 kg     Height:       Physical Exam General:chronically ill appearing, lethargic male in NAD Heart:RRR, no mrg Lungs:CTAB, nml WOB on RA Abdomen:soft, NTND Extremities:1+ LE edema, b/l BKA Dialysis Access: LU AVF +b/t   Filed Weights   01/10/24 1406 01/10/24 1748 01/11/24 0340  Weight: 90.4 kg 88.1 kg 88.7 kg    Intake/Output Summary (Last 24 hours) at 01/11/2024 1309 Last data filed at 01/11/2024 0400 Gross per 24 hour  Intake 100 ml  Output 2000 ml  Net -1900 ml    Additional Objective Labs: Basic Metabolic Panel: Recent Labs  Lab 01/08/24 0046 01/08/24 0541 01/10/24 0258  NA 136 134* 137  K 5.7* 4.6 5.1  CL 99 96* 98  CO2 22 21* 22  GLUCOSE 76 101* 98  BUN 58* 55* 54*  CREATININE 6.73* 6.47* 6.90*  CALCIUM  7.3* 6.6* 7.7*  PHOS  --  5.2* 6.0*   Liver Function Tests: Recent Labs  Lab 01/08/24 0046 01/08/24 0541 01/10/24 0258  AST 22  --   --   ALT 12  --   --   ALKPHOS 165*  --   --   BILITOT 1.0  --   --   PROT 4.9*  --   --   ALBUMIN  2.3* 1.7* 2.0*   CBC: Recent Labs  Lab 01/08/24 0046 01/08/24 0541 01/10/24 0258  WBC 11.6* 15.1* 11.8*  NEUTROABS 10.6* 13.5* 10.1*  HGB 12.0* 10.5* 10.6*  HCT 38.0* 34.2* 33.3*  MCV 88.8 91.4 87.6  PLT 148* 141* 127*   Blood Culture    Component Value Date/Time   SDES KNEE 01/08/2024 0151   SPECREQUEST NONE 01/08/2024 0151   CULT  01/08/2024 0151    NO GROWTH 3 DAYS Performed at [facility], [address] [place], [place] [address]    REPTSTATUS 01/11/2024 FINAL 01/08/2024  0151    Medications:  ceFEPime  (MAXIPIME ) IV 2 g (01/10/24 1821)   metronidazole  500 mg (01/11/24 0803)   vancomycin  1,000 mg (01/10/24 1851)    allopurinol   200 mg Oral Daily   Chlorhexidine  Gluconate Cloth  6 each Topical Q0600   Chlorhexidine  Gluconate Cloth  6 each Topical Q0600   insulin  aspart  0-6 Units Subcutaneous TID WC   levothyroxine   112 mcg Oral Q0600   lidocaine   1 patch Transdermal Q24H   midodrine  10 mg Oral TID WC   modafinil   200 mg Oral Daily   pantoprazole   40 mg Oral Daily   rosuvastatin   10 mg Oral Daily   sevelamer  carbonate  800 mg Oral TID WC    Dialysis Orders: TTS [facility] 3.75hrs, 400/AF 1.5, 84.6kg 2K, 2.5Ca LU AVF No heparin  Calcitriol  1mcg qHD Venofer 50mg  qwk     Assessment/ Plan: SIRS: rodgers GRIFFES, ^WBC, LA 2.2, hypotension. Cellulitis around R knee - R knee aspirate cx negative. IV abx per pmd. CT c/a/p w/o acute findings. Per pmd.  R Knee cellulitis -  on ABX.  Improving. per PMD.  AMS - on going. Multifactorial etiology Hypotension: soft bp's, got IVF x 1 L, midodrine started by PMD for BP support.  ESRD: on HD TTS. Has not missed HD. Next HD on 01/13/24.  Volume: CXR was clear, close to dry wt, mild vol excess. Lower vol as tol w/ HD.  Anemia of esrd: Hb 10-12 here, no esa needs. Will follow.  Secondary hyperparathyroidism - CCa in goal.  Phos elevated. Continue home meds.  Recurrent ascites: possibly d/t heart failure. Prior w/u did not find liver disease. Gets LVP regularly in OP setting. Nutrition - renal diet w/fluid restrictions.  Manuelita Labella, PA-C [facility] 01/11/2024,1:09 PM  LOS: 3 days

## 2024-01-12 ENCOUNTER — Inpatient Hospital Stay (HOSPITAL_COMMUNITY)

## 2024-01-12 DIAGNOSIS — R6510 Systemic inflammatory response syndrome (SIRS) of non-infectious origin without acute organ dysfunction: Secondary | ICD-10-CM | POA: Diagnosis not present

## 2024-01-12 HISTORY — PX: IR PARACENTESIS: IMG2679

## 2024-01-12 LAB — GLUCOSE, CAPILLARY
Glucose-Capillary: 86 mg/dL (ref 70–99)
Glucose-Capillary: 89 mg/dL (ref 70–99)
Glucose-Capillary: 70 mg/dL (ref 70–99)
Glucose-Capillary: 125 mg/dL — ABNORMAL HIGH (ref 70–99)

## 2024-01-12 LAB — MAGNESIUM: Magnesium: 2.2 mg/dL (ref 1.7–2.4)

## 2024-01-12 MED ORDER — LIDOCAINE HCL 1 % IJ SOLN
20.0000 mL | Freq: Once | INTRAMUSCULAR | Status: AC
  Administered 2024-01-12: 10 mL via INTRADERMAL

## 2024-01-12 MED ORDER — SODIUM CHLORIDE 0.9 % IV SOLN
2.0000 g | INTRAVENOUS | Status: DC
  Administered 2024-01-12 – 2024-01-13 (×2): 2 g via INTRAVENOUS
  Filled 2024-01-12 (×2): qty 20

## 2024-01-12 MED ORDER — LIDOCAINE HCL 1 % IJ SOLN
INTRAMUSCULAR | Status: AC
Start: 2024-01-12 — End: 2024-01-12
  Filled 2024-01-12: qty 20

## 2024-01-12 MED ORDER — ALBUMIN HUMAN 25 % IV SOLN
25.0000 g | Freq: Once | INTRAVENOUS | Status: AC
  Administered 2024-01-12: 25 g via INTRAVENOUS
  Filled 2024-01-12: qty 100

## 2024-01-12 NOTE — Significant Event (Signed)
 patient has refused to take midodrine and renvela  today. overall guarded with care. not eating had family bring a vanilla milk shake from [employer]. b/p-142/52  MD made aware.

## 2024-01-12 NOTE — Progress Notes (Signed)
 [facility] Progress Note   Subjective:   Patient seen and examined in room.  Asking to get up to go to the bathroom.  Increased ascites- asking for paracentesis.     Objective Vitals:   01/12/24 0100 01/12/24 0400 01/12/24 0500 01/12/24 0719  BP: (!) 166/66 (!) 149/59  (!) 150/50  Pulse: 73   67  Resp: 16 20  14   Temp: 97.9 F (36.6 C) 98 F (36.7 C)  98.7 F (37.1 C)  TempSrc: Oral Oral  Oral  SpO2: 99%   98%  Weight:   88.1 kg   Height:       Physical Exam General:chronically ill appearing, lethargic male in NAD Heart:RRR, no mrg Lungs:CTAB, nml WOB on RA Abdomen:soft, NTND, + fluid wave with ascites Extremities:1+ LE edema, b/l BKA Dialysis Access: LU AVF +b/t   Filed Weights   01/10/24 1748 01/11/24 0340 01/12/24 0500  Weight: 88.1 kg 88.7 kg 88.1 kg    Intake/Output Summary (Last 24 hours) at 01/12/2024 1204 Last data filed at 01/12/2024 0600 Gross per 24 hour  Intake 120 ml  Output --  Net 120 ml    Additional Objective Labs: Basic Metabolic Panel: Recent Labs  Lab 01/08/24 0046 01/08/24 0541 01/10/24 0258  NA 136 134* 137  K 5.7* 4.6 5.1  CL 99 96* 98  CO2 22 21* 22  GLUCOSE 76 101* 98  BUN 58* 55* 54*  CREATININE 6.73* 6.47* 6.90*  CALCIUM  7.3* 6.6* 7.7*  PHOS  --  5.2* 6.0*   Liver Function Tests: Recent Labs  Lab 01/08/24 0046 01/08/24 0541 01/10/24 0258  AST 22  --   --   ALT 12  --   --   ALKPHOS 165*  --   --   BILITOT 1.0  --   --   PROT 4.9*  --   --   ALBUMIN  2.3* 1.7* 2.0*   CBC: Recent Labs  Lab 01/08/24 0046 01/08/24 0541 01/10/24 0258  WBC 11.6* 15.1* 11.8*  NEUTROABS 10.6* 13.5* 10.1*  HGB 12.0* 10.5* 10.6*  HCT 38.0* 34.2* 33.3*  MCV 88.8 91.4 87.6  PLT 148* 141* 127*   Blood Culture    Component Value Date/Time   SDES KNEE 01/11/2024 2130   SPECREQUEST NONE 01/11/2024 2130   CULT  01/11/2024 2130    TOO YOUNG TO READ Performed at [facility], [address], [place], [place]  [address]    REPTSTATUS PENDING 01/11/2024 2130    Medications:  cefTRIAXone  (ROCEPHIN )  IV     vancomycin  1,000 mg (01/10/24 1851)    allopurinol   200 mg Oral Daily   Chlorhexidine  Gluconate Cloth  6 each Topical Q0600   Chlorhexidine  Gluconate Cloth  6 each Topical Q0600   insulin  aspart  0-6 Units Subcutaneous TID WC   levothyroxine   112 mcg Oral Q0600   lidocaine   1 patch Transdermal Q24H   midodrine  10 mg Oral TID WC   modafinil   200 mg Oral Daily   pantoprazole   40 mg Oral Daily   rosuvastatin   10 mg Oral Daily   sevelamer  carbonate  800 mg Oral TID WC    Dialysis Orders: TTS [facility] 3.75hrs, 400/AF 1.5, 84.6kg 2K, 2.5Ca LU AVF No heparin  Calcitriol  1mcg qHD Venofer 50mg  qwk     Assessment/ Plan: SIRS: rodgers GRIFFES, ^WBC, LA 2.2, hypotension. Cellulitis around R knee - R knee aspirate cx negative. IV abx per pmd. CT c/a/p w/o acute findings. Per pmd.  R Knee cellulitis - on ABX.  Improving. per PMD.  AMS - on going. Multifactorial etiology Hypotension: soft bp's, got IVF x 1 L, midodrine started by PMD for BP support.  ESRD: on HD TTS. Has not missed HD. Next HD on 01/13/24.  Volume: CXR was clear, close to dry wt, mild vol excess. Lower vol as tol w/ HD.  Anemia of esrd: Hb 10-12 here, no esa needs. Will follow.  Secondary hyperparathyroidism - CCa in goal.  Phos elevated. Continue home meds.  Recurrent ascites: possibly d/t heart failure. Prior w/u did not find liver disease. Gets LVP regularly in OP setting.  Will order.   Nutrition - renal diet w/fluid restrictions.  Almarie Bonine MD  [facility] 01/12/2024,12:04 PM  LOS: 4 days

## 2024-01-12 NOTE — Progress Notes (Signed)
 Pt Has been grunting at baseline,forgetful, Wife and Son-law visited patient refused meals family brought milk shake from restaurant. Vitals stable, midodrine D/C  Paracentesis left mid abdomen Puncture site clean dry intact.  IV albumin  leaked out after second bottle was spiked.  IV team Consulted.

## 2024-01-12 NOTE — Progress Notes (Signed)
 Inpatient Rehab Admissions Coordinator:   Per therapy recommendations, patient was screened for CIR candidacy by Megan Salon, MS, CCC-SLP. At this time, Pt. is not at a level to tolerate the intensity of CIR; however,  Pt. may have potential to progress to becoming a potential CIR candidate, so CIR admissions team will follow and monitor for progress and participation with therapies and place consult order if Pt. appears to be an appropriate candidate. Please contact me with any questions.   Megan Salon, MS, CCC-SLP Rehab Admissions Coordinator  [phone] (celll) [phone] (office)

## 2024-01-12 NOTE — Evaluation (Addendum)
 Physical Therapy Evaluation Patient Details Name: [name] MRN: [id number] DOB: 1950-11-28 Today's Date: 01/12/2024  History of Present Illness  73 year old male admitted 10/2 with SIRS, encephalopathy, and right knee pain/?cellulitis.  Patient underwent arthroscopy of the right knee at the emergency room and analysis was negative for crystals, with WBC of 3. S/p 2 cc of purulent fluid aspiration from prepatellar septic bursa on 10/5/  Cultures pending.  Pt going for paracentesis 10/6.   EFY:hnlu, hypertension, diabetes mellitus, prior history of CABG and ESRD on hemodialysis TTS, ascitis.  Patient is s/p bilateral BKA.  Clinical Impression  Pt admitted with above diagnosis. Pt was able to sit EOB 15 minutes with CGA and cues. Attempted to have pt place right prosthesis on but it wouldn't fit pt due to right residual limb edema. Given that right prosthetic didn't fit, didn't attempt to get the left prosthesis on.  Noted a scab on the end of the right residual limb which wife states is always there. Also noted dried blood in the bottom of the prosthesis.  REcommend having pts company come and assess prosthetics while pt is in hospital.  [facility] to update them on the status of prosthetics and they plan to send a rep sometime in next few days. Wife states pt will need to be Modif I with use of rollator and prostheses prior to going home.  REcommend post acute rehab > 3 hours day.  Will follow acutely.  Pt currently with functional limitations due to the deficits listed below (see PT Problem List). Pt will benefit from acute skilled PT to increase their independence and safety with mobility to allow discharge.           If plan is discharge home, recommend the following: A lot of help with walking and/or transfers;A lot of help with bathing/dressing/bathroom;Assistance with cooking/housework;Assist for transportation;Help with stairs or ramp for entrance   Can travel by private vehicle         Equipment Recommendations None recommended by PT  Recommendations for Other Services  Rehab consult    Functional Status Assessment Patient has had a recent decline in their functional status and demonstrates the ability to make significant improvements in function in a reasonable and predictable amount of time.     Precautions / Restrictions Precautions Precautions: Fall Required Braces or Orthoses: Other Brace (Bil LE prostheses) Restrictions Weight Bearing Restrictions Per Provider Order: No      Mobility  Bed Mobility Overal bed mobility: Needs Assistance Bed Mobility: Supine to Sit, Sit to Supine     Supine to sit: Mod assist Sit to supine: Max assist   General bed mobility comments: Needed mod assist with use of pad to come to EOB with assist to move LES and trunk.    Transfers                   General transfer comment: Unable as pt was unable to get right LE prosthesis on due to swelling right residual limb. Also noted pain patch on and a dressing on a small scabbed area on end of residual limb.  Questioned pt and wife about the dressing and wife states that pt always has abrasions and they use cream at home on the abrasions and he wears the prosthesis at all times.  Wife to bring in cream. Given that the right prosthesis wouldnt fit, wife had brought pts shrinkers therefore placed bil shrinker socks on pt in hopes that swelling will subside  for better fit of prosthesis.    Ambulation/Gait                  Stairs            Wheelchair Mobility     Tilt Bed    Modified Rankin (Stroke Patients Only)       Balance                                             Pertinent Vitals/Pain Pain Assessment Pain Assessment: 0-10 Pain Score: 3  Pain Location: right knee Pain Descriptors / Indicators: Aching, Discomfort, Grimacing, Guarding Pain Intervention(s): Limited activity within patient's tolerance, Monitored  during session, Repositioned    Home Living Family/patient expects to be discharged to:: Private residence Living Arrangements: Spouse/significant other Available Help at Discharge: Family;Available 24 hours/day Type of Home: House Home Access: Ramped entrance     Alternate Level Stairs-Number of Steps: 1 Home Layout: Multi-level;Able to live on main level with bedroom/bathroom (Just uses stair lift to go down to bottom level) Home Equipment: Rolling Walker (2 wheels);Shower seat;Rollator (4 wheels);Wheelchair - manual;BSC/3in1;Grab bars - toilet;Grab bars - tub/shower;Hand held shower head Additional Comments: Goes to Outpt PT 3 x week    Prior Function Prior Level of Function : Independent/Modified Independent             Mobility Comments: walks with rollator and prostheses, uses rails in bathroom as rollator won't fit ADLs Comments: ADLs independently, wife assists with IADLs, doesn't drive     Extremity/Trunk Assessment   Upper Extremity Assessment Upper Extremity Assessment: Defer to OT evaluation    Lower Extremity Assessment Lower Extremity Assessment: Overall WFL for tasks assessed    Cervical / Trunk Assessment Cervical / Trunk Assessment: Kyphotic  Communication   Communication Communication: No apparent difficulties    Cognition Arousal: Alert Behavior During Therapy: WFL for tasks assessed/performed   PT - Cognitive impairments: No apparent impairments                         Following commands: Intact       Cueing       General Comments General comments (skin integrity, edema, etc.): VSS    Exercises     Assessment/Plan    PT Assessment Patient needs continued PT services  PT Problem List Decreased activity tolerance;Decreased balance;Decreased mobility;Decreased safety awareness;Decreased knowledge of use of DME;Decreased knowledge of precautions;Pain;Decreased skin integrity       PT Treatment Interventions DME  instruction;Gait training;Functional mobility training;Therapeutic activities;Therapeutic exercise;Balance training;Patient/family education;Wheelchair mobility training    PT Goals (Current goals can be found in the Care Plan section)  Acute Rehab PT Goals Patient Stated Goal: to go home PT Goal Formulation: With patient/family Time For Goal Achievement: 01/26/24 Potential to Achieve Goals: Fair    Frequency Min 3X/week     Co-evaluation               AM-PAC PT 6 Clicks Mobility  Outcome Measure Help needed turning from your back to your side while in a flat bed without using bedrails?: A Lot Help needed moving from lying on your back to sitting on the side of a flat bed without using bedrails?: A Lot Help needed moving to and from a bed to a chair (including a wheelchair)?: Total Help needed standing up from  a chair using your arms (e.g., wheelchair or bedside chair)?: Total Help needed to walk in hospital room?: Total Help needed climbing 3-5 steps with a railing? : Total 6 Click Score: 8    End of Session   Activity Tolerance: Patient limited by fatigue Patient left: in bed;with call bell/phone within reach;with family/visitor present Nurse Communication: Mobility status;Need for lift equipment (IV leaking and nurse came in to address) PT Visit Diagnosis: Muscle weakness (generalized) (M62.81);Pain Pain - Right/Left: Right Pain - part of body: Leg    Time: 1335-1415 PT Time Calculation (min) (ACUTE ONLY): 40 min   Charges:   PT Evaluation $PT Eval Moderate Complexity: 1 Mod PT Treatments $Therapeutic Activity: 23-37 mins PT General Charges $$ ACUTE PT VISIT: 1 Visit         Nainoa Woldt M,PT Acute Rehab Services [phone]   Stephane JULIANNA Bevel 01/12/2024, 2:37 PM

## 2024-01-12 NOTE — Progress Notes (Signed)
 PROGRESS NOTE    [name]  FMW:[id number] DOB: [birth year] DOA: 01/08/2024 PCP: [name], NP   Brief Narrative:   73 year old male with multiple medical and cardiac history.  Patient carries diagnosis of gout, hypertension, diabetes mellitus, prior history of CABG and ESRD on hemodialysis TTS.  Patient is s/p bilateral BKA.  Patient was admitted with SIRS, encephalopathy, right knee pain with associated redness of the skin.  Patient underwent arthroscopy of the right knee at the emergency room and analysis was negative for crystals, with WBC of 3.  Cultures have remained negative to date.  Patient is currently on IV vancomycin , Flagyl  and cefepime .  Uric acid was 4.2.  Follow final cultures. ID consulted. S/p 2 cc of purulent fluid aspiration from prepatellar septic bursa on 10/5, f/u culture results.   Assessment & Plan:  Principal Problem:   SIRS (systemic inflammatory response syndrome) (HCC) Active Problems:   Permanent atrial fibrillation (HCC)   Encephalopathy   Hypotension   Ascites   CAD (coronary artery disease)   ESRD on dialysis (HCC)   Right knee pain   Insulin  dependent type 2 diabetes mellitus (HCC)   Anemia of chronic disease   Thrombocytopenia   Essential hypertension   Pressure injury of skin   Gastroesophageal reflux disease without esophagitis   (HFpEF) heart failure with preserved ejection fraction (HCC)   SIRS (systemic inflammatory response syndrome) /cellulitis around right knee area/right prepatellar septic bursitis:  -CT chest abdomen pelvis with otherwise stable findings -Started on empiric Rocephin  and vancomycin  in the ER.  Broadened to cefepime , vanc and flagyl  by the admitting team. - Synovial fluid analysis has been negative. - Uric acid of 4.2. -s/p 2 cc of purulent fluid were aspirated from the prepatellar septic bursa by Dr Germaine on 10/5. - Infectious disease team ob board. Right prepatellar septic bursitis diagnosis has been  made.  - Continue IV cefepime  and vancomycin . - Discontinued Flagyl . - Encephalopathy has resolved   Hypotension: resolved now -On presentation, SBP in the 80s despite fluid bolus in setting of SIRS  Continue midodrine 10 mg p.o. 3 times daily (as needed). Systolic blood pressure improved to 130+ millimeters mercury.   Continue to monitor closely    Encephalopathy, possibly combined toxic and metabolic: Resolved now -Suspect multifactorial with contributions of SIRS, ESRD, hypotension -Ammonia level came back normal.   Permanent atrial fibrillation  -Rate controlled atrial fibrillation on presentation  -Not an anticoagulation in setting of hx/o GU bleeding    Ascites -Recurrent ascites on CT     ESRD on dialysis   Continue hemodialysis TTS.   CAD Baseline history of CAD status post CABG Stable EKG stable    Insulin  dependent type 2 diabetes mellitus  on SSI for now    Anemia of chronic disease Last hemoglobin of 10.6 g/dL.   Near baseline  No overt signs of bleeding at present  Monitor    Thrombocytopenia Monitor   Essential hypertension/hypotension -Antihypertensives are on hold. - Continue to monitor closely    (HFpEF) heart failure with preserved ejection fraction: NYHA III 2D ECHO 11/2022 with EF 60-65%, grade 1 mitral regurgitation, moderate TR, mild PH + volume overload and hypotension on presentation in setting of SIRS, ESRD, recurrent ascites    01/09/2024: Repeat echo revealed ejection fraction of 55 to 60%, left ventricular diastolic function could not be evaluated, interventricular septum is flattened in systole and diastole, consistent with right ventricular pressure and volume overload, dilated inferior vena cava with less  than 50% respiratory variability.   Patient is on hemodialysis TTS.   Gastroesophageal reflux disease without esophagitis PPI    Disposition: Home with wife  DVT prophylaxis: SCDs Start: 01/08/24 0534     Code Status: Full  Code Family Communication:  None at the bedside Status is: Inpatient Remains inpatient appropriate because: knee infection    Subjective:  He says that right knee pain has improved. Denied any active complaints.   Examination:  General exam: Appears calm and comfortable  Respiratory system: Clear to auscultation. Respiratory effort normal. Cardiovascular system: S1 & S2 heard, RRR. No JVD, murmurs, rubs, gallops or clicks. No pedal edema. Gastrointestinal system: Abdomen is nondistended, soft and nontender. No organomegaly or masses felt. Normal bowel sounds heard. Central nervous system: Alert and oriented. No focal neurological deficits. Extremities: S/p b/l BKA Skin: right knee is slightly swollen and erythematous Psychiatry: Judgement and insight appear normal. Mood & affect appropriate.      Diet Orders (From admission, onward)     Start     Ordered   01/08/24 0534  Diet renal with fluid restriction Fluid restriction: Other (see comments); Room service appropriate? Yes; Fluid consistency: Thin  Diet effective now       Comments: 3L  Question Answer Comment  Fluid restriction: Other (see comments)   Room service appropriate? Yes   Fluid consistency: Thin      01/08/24 0534            Objective: Vitals:   01/12/24 0100 01/12/24 0400 01/12/24 0500 01/12/24 0719  BP: (!) 166/66 (!) 149/59  (!) 150/50  Pulse: 73   67  Resp: 16 20  14   Temp: 97.9 F (36.6 C) 98 F (36.7 C)  98.7 F (37.1 C)  TempSrc: Oral Oral  Oral  SpO2: 99%   98%  Weight:   88.1 kg   Height:        Intake/Output Summary (Last 24 hours) at 01/12/2024 1010 Last data filed at 01/12/2024 0600 Gross per 24 hour  Intake 120 ml  Output --  Net 120 ml   Filed Weights   01/10/24 1748 01/11/24 0340 01/12/24 0500  Weight: 88.1 kg 88.7 kg 88.1 kg    Scheduled Meds:  allopurinol   200 mg Oral Daily   Chlorhexidine  Gluconate Cloth  6 each Topical Q0600   Chlorhexidine  Gluconate Cloth  6 each  Topical Q0600   insulin  aspart  0-6 Units Subcutaneous TID WC   levothyroxine   112 mcg Oral Q0600   lidocaine   1 patch Transdermal Q24H   midodrine  10 mg Oral TID WC   modafinil   200 mg Oral Daily   pantoprazole   40 mg Oral Daily   rosuvastatin   10 mg Oral Daily   sevelamer  carbonate  800 mg Oral TID WC   Continuous Infusions:  cefTRIAXone  (ROCEPHIN )  IV     vancomycin  1,000 mg (01/10/24 1851)    Nutritional status     Body mass index is 27.09 kg/m.  Data Reviewed:   CBC: Recent Labs  Lab 01/08/24 0046 01/08/24 0541 01/10/24 0258  WBC 11.6* 15.1* 11.8*  NEUTROABS 10.6* 13.5* 10.1*  HGB 12.0* 10.5* 10.6*  HCT 38.0* 34.2* 33.3*  MCV 88.8 91.4 87.6  PLT 148* 141* 127*   Basic Metabolic Panel: Recent Labs  Lab 01/08/24 0046 01/08/24 0541 01/10/24 0258 01/11/24 0256 01/12/24 0311  NA 136 134* 137  --   --   K 5.7* 4.6 5.1  --   --  CL 99 96* 98  --   --   CO2 22 21* 22  --   --   GLUCOSE 76 101* 98  --   --   BUN 58* 55* 54*  --   --   CREATININE 6.73* 6.47* 6.90*  --   --   CALCIUM  7.3* 6.6* 7.7*  --   --   MG  --  1.0*  --  2.1 2.2  PHOS  --  5.2* 6.0*  --   --    GFR: Estimated Creatinine Clearance: 10 mL/min (A) (by C-G formula based on SCr of 6.9 mg/dL (H)). Liver Function Tests: Recent Labs  Lab 01/08/24 0046 01/08/24 0541 01/10/24 0258  AST 22  --   --   ALT 12  --   --   ALKPHOS 165*  --   --   BILITOT 1.0  --   --   PROT 4.9*  --   --   ALBUMIN  2.3* 1.7* 2.0*   No results for input(s): LIPASE, AMYLASE in the last 168 hours. Recent Labs  Lab 01/08/24 1025  AMMONIA 31   Coagulation Profile: Recent Labs  Lab 01/08/24 0046  INR 1.1   Cardiac Enzymes: No results for input(s): CKTOTAL, CKMB, CKMBINDEX, TROPONINI in the last 168 hours. BNP (last 3 results) Recent Labs    10/28/23 2109  PROBNP >35,000.0*   HbA1C: No results for input(s): HGBA1C in the last 72 hours. CBG: Recent Labs  Lab 01/11/24 1051  01/11/24 1152 01/11/24 1633 01/11/24 2131 01/12/24 0614  GLUCAP 95 102* 92 86 86   Lipid Profile: No results for input(s): CHOL, HDL, LDLCALC, TRIG, CHOLHDL, LDLDIRECT in the last 72 hours. Thyroid  Function Tests: No results for input(s): TSH, T4TOTAL, FREET4, T3FREE, THYROIDAB in the last 72 hours. Anemia Panel: No results for input(s): VITAMINB12, FOLATE, FERRITIN, TIBC, IRON, RETICCTPCT in the last 72 hours. Sepsis Labs: Recent Labs  Lab 01/08/24 0053 01/08/24 0247  LATICACIDVEN 2.0* 2.2*    Recent Results (from the past 240 hours)  Culture, blood (Routine x 2)     Status: None (Preliminary result)   Collection Time: 01/08/24 12:46 AM   Specimen: BLOOD RIGHT ARM  Result Value Ref Range Status   Specimen Description BLOOD RIGHT ARM  Final   Special Requests   Final    BOTTLES DRAWN AEROBIC AND ANAEROBIC Blood Culture adequate volume   Culture   Final    NO GROWTH 4 DAYS Performed at [facility], [address] [place], [place] [address]    Report Status PENDING  Incomplete  Culture, blood (Routine x 2)     Status: None (Preliminary result)   Collection Time: 01/08/24 12:46 AM   Specimen: BLOOD RIGHT ARM  Result Value Ref Range Status   Specimen Description BLOOD RIGHT ARM  Final   Special Requests   Final    BOTTLES DRAWN AEROBIC AND ANAEROBIC Blood Culture results may not be optimal due to an inadequate volume of blood received in culture bottles   Culture   Final    NO GROWTH 4 DAYS Performed at [facility], [address] [place], [place] [address]    Report Status PENDING  Incomplete  Body fluid culture w Gram Stain     Status: None   Collection Time: 01/08/24  1:51 AM   Specimen: KNEE; Body Fluid  Result Value Ref Range Status   Specimen Description KNEE  Final   Special Requests NONE  Final   Gram Stain   Final    RARE WBC PRESENT, PREDOMINANTLY PMN NO ORGANISMS SEEN    Culture   Final    NO GROWTH 3  DAYS Performed at [facility], [address] [place], [place] [address]    Report Status 01/11/2024 FINAL  Final  Resp panel by RT-PCR (RSV, Flu A&B, Covid) KNEE     Status: None   Collection Time: 01/08/24  1:51 AM   Specimen: KNEE; Nasal Swab  Result Value Ref Range Status   SARS Coronavirus 2 by RT PCR NEGATIVE NEGATIVE Final   Influenza A by PCR NEGATIVE NEGATIVE Final   Influenza B by PCR NEGATIVE NEGATIVE Final    Comment: (NOTE) The Xpert Xpress SARS-CoV-2/FLU/RSV plus assay is intended as an aid in the diagnosis of influenza from Nasopharyngeal swab specimens and should not be used as a sole basis for treatment. Nasal washings and aspirates are unacceptable for Xpert Xpress SARS-CoV-2/FLU/RSV testing.  Fact Sheet for Patients: BloggerCourse.com  Fact Sheet for Healthcare Providers: SeriousBroker.it  This test is not yet approved or cleared by the [place]  FDA and has been authorized for detection and/or diagnosis of SARS-CoV-2 by FDA under an Emergency Use Authorization (EUA). This EUA will remain in effect (meaning this test can be used) for the duration of the COVID-19 declaration under Section 564(b)(1) of the Act, 21 U.S.C. section 360bbb-3(b)(1), unless the authorization is terminated or revoked.     Resp Syncytial Virus by PCR NEGATIVE NEGATIVE Final    Comment: (NOTE) Fact Sheet for Patients: BloggerCourse.com  Fact Sheet for Healthcare Providers: SeriousBroker.it  This test is not yet approved or cleared by the [place]  FDA and has been authorized for detection and/or diagnosis of SARS-CoV-2 by FDA under an Emergency Use Authorization (EUA). This EUA will remain in effect (meaning this test can be used) for the duration of the COVID-19 declaration under Section 564(b)(1) of the Act, 21 U.S.C. section 360bbb-3(b)(1), unless the authorization  is terminated or revoked.  Performed at [facility], [address], [place], [place] [address]   Body fluid culture w Gram Stain     Status: None (Preliminary result)   Collection Time: 01/11/24  9:30 PM   Specimen: KNEE; Body Fluid  Result Value Ref Range Status   Specimen Description KNEE  Final   Special Requests NONE  Final   Gram Stain   Final    FEW WBC PRESENT, PREDOMINANTLY PMN MODERATE GRAM POSITIVE COCCI Performed at [facility], [address], [name], [place] [address]    Culture PENDING  Incomplete   Report Status PENDING  Incomplete         Radiology Studies: No results found.         LOS: 4 days   Time spent= 40 mins    Deliliah Room, MD [facility]  If 7PM-7AM, please contact night-coverage  01/12/2024, 10:10 AM

## 2024-01-12 NOTE — Procedures (Signed)
 PROCEDURE SUMMARY:  Successful image-guided paracentesis from the left lower abdomen.  Yielded 3.6 liters of straw colored fluid.  No immediate complications.  EBL: trace Patient tolerated well.   Specimen not sent for labs.  Please see imaging section of Epic for full dictation.  Kimble DEL Zabrina Brotherton PA-C 01/12/2024 3:32 PM

## 2024-01-13 DIAGNOSIS — R6510 Systemic inflammatory response syndrome (SIRS) of non-infectious origin without acute organ dysfunction: Secondary | ICD-10-CM | POA: Diagnosis not present

## 2024-01-13 LAB — CULTURE, BLOOD (ROUTINE X 2)
Culture: NO GROWTH
Culture: NO GROWTH
Special Requests: ADEQUATE

## 2024-01-13 LAB — MAGNESIUM: Magnesium: 2 mg/dL (ref 1.7–2.4)

## 2024-01-13 LAB — GLUCOSE, CAPILLARY
Glucose-Capillary: 101 mg/dL — ABNORMAL HIGH (ref 70–99)
Glucose-Capillary: 138 mg/dL — ABNORMAL HIGH (ref 70–99)

## 2024-01-13 MED ORDER — HEPARIN SODIUM (PORCINE) 5000 UNIT/ML IJ SOLN: 5000.0000 [IU] | Freq: Three times a day (TID) | INTRAMUSCULAR | Status: DC

## 2024-01-13 NOTE — Progress Notes (Signed)
   Inpatient Rehabilitation Admissions Coordinator   Per SW, patient wants no rehab. CIR will not follow.  Heron Leavell, RN, MSN Rehab Admissions Coordinator [phone] 01/13/2024 6:12 PM

## 2024-01-13 NOTE — Progress Notes (Signed)
 PROGRESS NOTE    [name]  FMW:[id number] DOB: Aug 27, 1950 DOA: 01/08/2024 PCP: [name], NP   Brief Narrative:   73 year old male with multiple medical and cardiac history.  Patient carries diagnosis of gout, hypertension, diabetes mellitus, prior history of CABG and ESRD on hemodialysis TTS.  Patient is s/p bilateral BKA.  Patient was admitted with SIRS, encephalopathy, right knee pain with associated redness of the skin.  Patient underwent arthroscopy of the right knee at the emergency room and analysis was negative for crystals, with WBC of 3.  Cultures have remained negative to date.  Patient is currently on IV vancomycin , Flagyl  and cefepime .  Uric acid was 4.2.  Follow final cultures. ID consulted. S/p 2 cc of purulent fluid aspiration from prepatellar septic bursa on 10/5, f/u culture results.   Assessment & Plan:  Principal Problem:   SIRS (systemic inflammatory response syndrome) (HCC) Active Problems:   Permanent atrial fibrillation (HCC)   Encephalopathy   Hypotension   Ascites   CAD (coronary artery disease)   ESRD on dialysis (HCC)   Right knee pain   Insulin  dependent type 2 diabetes mellitus (HCC)   Anemia of chronic disease   Thrombocytopenia   Essential hypertension   Pressure injury of skin   Gastroesophageal reflux disease without esophagitis   (HFpEF) heart failure with preserved ejection fraction (HCC)   SIRS (systemic inflammatory response syndrome) /cellulitis around right knee area/right prepatellar septic bursitis likely secondary to S.aureus:  -CT chest abdomen pelvis with otherwise stable findings -Started on empiric Rocephin  and vancomycin  in the ER.  Broadened to cefepime , vanc and flagyl  by the admitting team. - Synovial fluid analysis from 10/5 is growing S.Aureus - Uric acid of 4.2. -s/p 2 cc of purulent fluid were aspirated from the prepatellar septic bursa by Dr Germaine on 10/5. - Infectious disease team ob board. Right prepatellar  septic bursitis diagnosis has been made.  - Continue IV cefepime  and vancomycin . - Discontinued Flagyl . - Encephalopathy has resolved   Hypotension: resolved now -On presentation, SBP in the 80s despite fluid bolus in setting of SIRS  Continue midodrine 10 mg p.o. 3 times daily (as needed). Systolic blood pressure improved to 130+ millimeters mercury.   Continue to monitor closely    Encephalopathy, possibly combined toxic and metabolic: Resolved now -Suspect multifactorial with contributions of SIRS, ESRD, hypotension -Ammonia level came back normal.   Permanent atrial fibrillation  -Rate controlled atrial fibrillation on presentation  -Not an anticoagulation in setting of hx/o GU bleeding    Ascites -Recurrent ascites on CT     ESRD on dialysis   Continue hemodialysis TTS.   CAD Baseline history of CAD status post CABG Stable EKG stable    Insulin  dependent type 2 diabetes mellitus  on SSI for now    Anemia of chronic disease Last hemoglobin of 10.6 g/dL.   Near baseline  No overt signs of bleeding at present  Monitor    Thrombocytopenia Monitor   Essential hypertension/hypotension -Antihypertensives are on hold. - Continue to monitor closely    (HFpEF) heart failure with preserved ejection fraction: NYHA III 2D ECHO 11/2022 with EF 60-65%, grade 1 mitral regurgitation, moderate TR, mild PH + volume overload and hypotension on presentation in setting of SIRS, ESRD, recurrent ascites    01/09/2024: Repeat echo revealed ejection fraction of 55 to 60%, left ventricular diastolic function could not be evaluated, interventricular septum is flattened in systole and diastole, consistent with right ventricular pressure and volume overload,  dilated inferior vena cava with less than 50% respiratory variability.   Patient is on hemodialysis TTS.   Gastroesophageal reflux disease without esophagitis PPI    Disposition: Home with wife. He doesn't want to go to CIR or any  rehab facility.  DVT prophylaxis: SCDs Start: 01/08/24 0534     Code Status: Full Code Family Communication:  None at the bedside Status is: Inpatient Remains inpatient appropriate because: knee infection    Subjective:  He was sitting in the chair. Garrel from Prosthetics was also present. He is feeling better today and hoping to get his swelling under control so his prosthetics can fit. He wants to go home on discharge.  Examination:  General exam: Appears calm and comfortable  Respiratory system: Clear to auscultation. Respiratory effort normal. Cardiovascular system: S1 & S2 heard, RRR. No JVD, murmurs, rubs, gallops or clicks. No pedal edema. Gastrointestinal system: Abdomen is nondistended, soft and nontender. No organomegaly or masses felt. Normal bowel sounds heard. Central nervous system: Alert and oriented. No focal neurological deficits. Extremities: S/p b/l BKA Skin: right knee is slightly swollen and erythematous Psychiatry: Judgement and insight appear normal. Mood & affect appropriate.      Diet Orders (From admission, onward)     Start     Ordered   01/08/24 0534  Diet renal with fluid restriction Fluid restriction: Other (see comments); Room service appropriate? Yes; Fluid consistency: Thin  Diet effective now       Comments: 3L  Question Answer Comment  Fluid restriction: Other (see comments)   Room service appropriate? Yes   Fluid consistency: Thin      01/08/24 0534            Objective: Vitals:   01/12/24 2038 01/12/24 2349 01/13/24 0321 01/13/24 0724  BP: (!) 155/68 (!) 139/52 132/61 (!) 134/52  Pulse: 72 65 69 60  Resp: 18 16 (!) 22 18  Temp: 98.5 F (36.9 C) (!) 97.2 F (36.2 C) 97.7 F (36.5 C) 98.6 F (37 C)  TempSrc: Oral Oral Oral Oral  SpO2: 96% 93% 98% 98%  Weight:   89.3 kg   Height:        Intake/Output Summary (Last 24 hours) at 01/13/2024 1001 Last data filed at 01/13/2024 0600 Gross per 24 hour  Intake 740 ml  Output  50 ml  Net 690 ml   Filed Weights   01/11/24 0340 01/12/24 0500 01/13/24 0321  Weight: 88.7 kg 88.1 kg 89.3 kg    Scheduled Meds:  allopurinol   200 mg Oral Daily   Chlorhexidine  Gluconate Cloth  6 each Topical Q0600   Chlorhexidine  Gluconate Cloth  6 each Topical Q0600   insulin  aspart  0-6 Units Subcutaneous TID WC   levothyroxine   112 mcg Oral Q0600   lidocaine   1 patch Transdermal Q24H   modafinil   200 mg Oral Daily   pantoprazole   40 mg Oral Daily   rosuvastatin   10 mg Oral Daily   sevelamer  carbonate  800 mg Oral TID WC   Continuous Infusions:  cefTRIAXone  (ROCEPHIN )  IV 2 g (01/12/24 1257)   vancomycin  1,000 mg (01/10/24 1851)    Nutritional status     Body mass index is 27.46 kg/m.  Data Reviewed:   CBC: Recent Labs  Lab 01/08/24 0046 01/08/24 0541 01/10/24 0258  WBC 11.6* 15.1* 11.8*  NEUTROABS 10.6* 13.5* 10.1*  HGB 12.0* 10.5* 10.6*  HCT 38.0* 34.2* 33.3*  MCV 88.8 91.4 87.6  PLT 148* 141* 127*  Basic Metabolic Panel: Recent Labs  Lab 01/08/24 0046 01/08/24 0541 01/10/24 0258 01/11/24 0256 01/12/24 0311  NA 136 134* 137  --   --   K 5.7* 4.6 5.1  --   --   CL 99 96* 98  --   --   CO2 22 21* 22  --   --   GLUCOSE 76 101* 98  --   --   BUN 58* 55* 54*  --   --   CREATININE 6.73* 6.47* 6.90*  --   --   CALCIUM  7.3* 6.6* 7.7*  --   --   MG  --  1.0*  --  2.1 2.2  PHOS  --  5.2* 6.0*  --   --    GFR: Estimated Creatinine Clearance: 10 mL/min (A) (by C-G formula based on SCr of 6.9 mg/dL (H)). Liver Function Tests: Recent Labs  Lab 01/08/24 0046 01/08/24 0541 01/10/24 0258  AST 22  --   --   ALT 12  --   --   ALKPHOS 165*  --   --   BILITOT 1.0  --   --   PROT 4.9*  --   --   ALBUMIN  2.3* 1.7* 2.0*   No results for input(s): LIPASE, AMYLASE in the last 168 hours. Recent Labs  Lab 01/08/24 1025  AMMONIA 31   Coagulation Profile: Recent Labs  Lab 01/08/24 0046  INR 1.1   Cardiac Enzymes: No results for input(s):  CKTOTAL, CKMB, CKMBINDEX, TROPONINI in the last 168 hours. BNP (last 3 results) Recent Labs    10/28/23 2109  PROBNP >35,000.0*   HbA1C: No results for input(s): HGBA1C in the last 72 hours. CBG: Recent Labs  Lab 01/12/24 0614 01/12/24 1104 01/12/24 1602 01/12/24 2150 01/13/24 0624  GLUCAP 86 89 70 125* 101*   Lipid Profile: No results for input(s): CHOL, HDL, LDLCALC, TRIG, CHOLHDL, LDLDIRECT in the last 72 hours. Thyroid  Function Tests: No results for input(s): TSH, T4TOTAL, FREET4, T3FREE, THYROIDAB in the last 72 hours. Anemia Panel: No results for input(s): VITAMINB12, FOLATE, FERRITIN, TIBC, IRON, RETICCTPCT in the last 72 hours. Sepsis Labs: Recent Labs  Lab 01/08/24 0053 01/08/24 0247  LATICACIDVEN 2.0* 2.2*    Recent Results (from the past 240 hours)  Culture, blood (Routine x 2)     Status: None   Collection Time: 01/08/24 12:46 AM   Specimen: BLOOD RIGHT ARM  Result Value Ref Range Status   Specimen Description BLOOD RIGHT ARM  Final   Special Requests   Final    BOTTLES DRAWN AEROBIC AND ANAEROBIC Blood Culture adequate volume   Culture   Final    NO GROWTH 5 DAYS Performed at [facility], [address], [place], [place] [address]    Report Status 01/13/2024 FINAL  Final  Culture, blood (Routine x 2)     Status: None   Collection Time: 01/08/24 12:46 AM   Specimen: BLOOD RIGHT ARM  Result Value Ref Range Status   Specimen Description BLOOD RIGHT ARM  Final   Special Requests   Final    BOTTLES DRAWN AEROBIC AND ANAEROBIC Blood Culture results may not be optimal due to an inadequate volume of blood received in culture bottles   Culture   Final    NO GROWTH 5 DAYS Performed at [facility], [address], [place], [place] [address]    Report Status 01/13/2024 FINAL  Final  Body fluid culture w Gram Stain  Status: None   Collection Time: 01/08/24  1:51 AM   Specimen: KNEE; Body Fluid   Result Value Ref Range Status   Specimen Description KNEE  Final   Special Requests NONE  Final   Gram Stain   Final    RARE WBC PRESENT, PREDOMINANTLY PMN NO ORGANISMS SEEN    Culture   Final    NO GROWTH 3 DAYS Performed at [facility], [address] [place], [place] [address]    Report Status 01/11/2024 FINAL  Final  Resp panel by RT-PCR (RSV, Flu A&B, Covid) KNEE     Status: None   Collection Time: 01/08/24  1:51 AM   Specimen: KNEE; Nasal Swab  Result Value Ref Range Status   SARS Coronavirus 2 by RT PCR NEGATIVE NEGATIVE Final   Influenza A by PCR NEGATIVE NEGATIVE Final   Influenza B by PCR NEGATIVE NEGATIVE Final    Comment: (NOTE) The Xpert Xpress SARS-CoV-2/FLU/RSV plus assay is intended as an aid in the diagnosis of influenza from Nasopharyngeal swab specimens and should not be used as a sole basis for treatment. Nasal washings and aspirates are unacceptable for Xpert Xpress SARS-CoV-2/FLU/RSV testing.  Fact Sheet for Patients: BloggerCourse.com  Fact Sheet for Healthcare Providers: SeriousBroker.it  This test is not yet approved or cleared by the [place]  FDA and has been authorized for detection and/or diagnosis of SARS-CoV-2 by FDA under an Emergency Use Authorization (EUA). This EUA will remain in effect (meaning this test can be used) for the duration of the COVID-19 declaration under Section 564(b)(1) of the Act, 21 U.S.C. section 360bbb-3(b)(1), unless the authorization is terminated or revoked.     Resp Syncytial Virus by PCR NEGATIVE NEGATIVE Final    Comment: (NOTE) Fact Sheet for Patients: BloggerCourse.com  Fact Sheet for Healthcare Providers: SeriousBroker.it  This test is not yet approved or cleared by the [place]  FDA and has been authorized for detection and/or diagnosis of SARS-CoV-2 by FDA under an Emergency Use  Authorization (EUA). This EUA will remain in effect (meaning this test can be used) for the duration of the COVID-19 declaration under Section 564(b)(1) of the Act, 21 U.S.C. section 360bbb-3(b)(1), unless the authorization is terminated or revoked.  Performed at [facility], [address] [place], [place] [address]   Body fluid culture w Gram Stain     Status: None (Preliminary result)   Collection Time: 01/11/24  9:30 PM   Specimen: KNEE; Body Fluid  Result Value Ref Range Status   Specimen Description KNEE  Final   Special Requests NONE  Final   Gram Stain   Final    FEW WBC PRESENT, PREDOMINANTLY PMN MODERATE GRAM POSITIVE COCCI    Culture   Final    ABUNDANT STAPHYLOCOCCUS AUREUS SUSCEPTIBILITIES TO FOLLOW Performed at [facility], [address], [place], [place] [address]    Report Status PENDING  Incomplete         Radiology Studies: IR Paracentesis Result Date: 01/12/2024 INDICATION: History of end-stage renal disease with recurrent ascites. Request for therapeutic paracentesis. EXAM: ULTRASOUND GUIDED THERAPEUTIC PARACENTESIS MEDICATIONS: 8 mL 1% lidocaine . COMPLICATIONS: None immediate. PROCEDURE: Informed written consent was obtained from the patient after a discussion of the risks, benefits and alternatives to treatment. A timeout was performed prior to the initiation of the procedure. Initial ultrasound scanning demonstrates a large amount of ascites within the left lower abdominal quadrant. The left lower abdomen was prepped and draped in the usual  sterile fashion. 1% lidocaine  was used for local anesthesia. Following this, a 19 gauge, 7-cm, Yueh catheter was introduced. An ultrasound image was saved for documentation purposes. The paracentesis was performed. The catheter was removed and a dressing was applied. The patient tolerated the procedure well without immediate post procedural complication. FINDINGS: A total of approximately 3.6 liters of straw  colored fluid was removed. IMPRESSION: Successful ultrasound-guided paracentesis yielding 3.6 liters of peritoneal fluid. Performed by: Wyatt Pommier, PA-C Electronically Signed   By: CHRISTELLA.  Shick M.D.   On: 01/12/2024 15:33           LOS: 5 days   Time spent= 40 mins    Deliliah Room, MD [facility]  If 7PM-7AM, please contact night-coverage  01/13/2024, 10:01 AM

## 2024-01-13 NOTE — TOC Progression Note (Addendum)
 Transition of Care [facility]) - Progression Note    Patient Details  Name: [name] MRN: [id number] Date of Birth: 11/15/1950  [facility] of [facility]) CM/SW Contact  Luise JAYSON Pan, [place] Phone Number: 01/13/2024, 8:57 AM  Clinical Narrative:   CIR is following.   10:12 AM Per MD, patient does not want any type of rehab and wants to go home with his wife. RNCM notified.   Expected Discharge Plan: IP Rehab Facility Barriers to Discharge: Continued Medical Work up               Expected Discharge Plan and Services In-house Referral: NA Discharge Planning Services: CM Consult Post Acute Care Choice: NA Living arrangements for the past 2 months: Single Family Home                 DME Arranged: N/A DME Agency: NA       HH Arranged: NA           Social Drivers of Health (SDOH) Interventions SDOH Screenings   Food Insecurity: No Food Insecurity (01/08/2024)  Housing: Low Risk  (01/08/2024)  Transportation Needs: No Transportation Needs (01/08/2024)  Utilities: Not At Risk (01/08/2024)  Depression (PHQ2-9): Low Risk  (12/22/2023)  Recent Concern: Depression (PHQ2-9) - Medium Risk (12/19/2023)  Financial Resource Strain: Low Risk  (09/15/2023)  Physical Activity: Inactive (09/15/2023)  Social Connections: Moderately Isolated (01/08/2024)  Stress: No Stress Concern Present (09/15/2023)  Tobacco Use: Medium Risk (01/08/2024)  Health Literacy: Adequate Health Literacy (09/15/2023)    Readmission Risk Interventions    01/09/2024    2:52 PM 10/29/2023    3:12 PM 07/29/2022   10:39 AM  Readmission Risk Prevention Plan  Transportation Screening Complete Complete Complete  PCP or Specialist Appt within 3-5 Days  Complete   HRI or Home Care Consult Complete Complete   Palliative Care Screening Not Applicable Not Applicable   Medication Review (RN Care Manager) Complete Complete   PCP or Specialist appointment within 3-5 days of discharge   Complete  SW Recovery Care/Counseling  Consult   Complete  Skilled Nursing Facility   Not Applicable

## 2024-01-13 NOTE — Progress Notes (Signed)
 PT Cancellation Note  Patient Details Name: [name] MRN: [id number] DOB: [birth year]   Cancelled Treatment:    Reason Eval/Treat Not Completed: Patient declined, no reason specified (pt in chair with shrinkers on and wants to wait for mobility until RLE smaller to fit in prosthetic. Pt states he will eat in chair, move back to bed with RN and wants to attempt therapy next date)   Alben Jepsen B Ryker Pherigo 01/13/2024, 12:15 PM Lenoard SQUIBB, PT Acute Rehabilitation Services Office: [phone]

## 2024-01-13 NOTE — Progress Notes (Signed)
 [facility] Progress Note   Subjective:   Patient seen and examined in room.  Had 3.6L paracentesis yesterday.  For HD today.  Objective Vitals:   01/12/24 2349 01/13/24 0321 01/13/24 0724 01/13/24 1052  BP: (!) 139/52 132/61 (!) 134/52 (!) 125/56  Pulse: 65 69 60 64  Resp: 16 (!) 22 18 20   Temp: (!) 97.2 F (36.2 C) 97.7 F (36.5 C) 98.6 F (37 C) 97.9 F (36.6 C)  TempSrc: Oral Oral Oral Oral  SpO2: 93% 98% 98%   Weight:  89.3 kg    Height:       Physical Exam General:chronically ill appearing, lethargic male in NAD Heart:RRR, no mrg Lungs:CTAB, nml WOB on RA Abdomen:soft, NTND, + fluid wave with ascites, better Extremities:1+ LE edema, b/l BKA Dialysis Access: LU AVF +b/t   Filed Weights   01/11/24 0340 01/12/24 0500 01/13/24 0321  Weight: 88.7 kg 88.1 kg 89.3 kg    Intake/Output Summary (Last 24 hours) at 01/13/2024 1316 Last data filed at 01/13/2024 0600 Gross per 24 hour  Intake 640 ml  Output 50 ml  Net 590 ml    Additional Objective Labs: Basic Metabolic Panel: Recent Labs  Lab 01/08/24 0046 01/08/24 0541 01/10/24 0258  NA 136 134* 137  K 5.7* 4.6 5.1  CL 99 96* 98  CO2 22 21* 22  GLUCOSE 76 101* 98  BUN 58* 55* 54*  CREATININE 6.73* 6.47* 6.90*  CALCIUM  7.3* 6.6* 7.7*  PHOS  --  5.2* 6.0*   Liver Function Tests: Recent Labs  Lab 01/08/24 0046 01/08/24 0541 01/10/24 0258  AST 22  --   --   ALT 12  --   --   ALKPHOS 165*  --   --   BILITOT 1.0  --   --   PROT 4.9*  --   --   ALBUMIN  2.3* 1.7* 2.0*   CBC: Recent Labs  Lab 01/08/24 0046 01/08/24 0541 01/10/24 0258  WBC 11.6* 15.1* 11.8*  NEUTROABS 10.6* 13.5* 10.1*  HGB 12.0* 10.5* 10.6*  HCT 38.0* 34.2* 33.3*  MCV 88.8 91.4 87.6  PLT 148* 141* 127*   Blood Culture    Component Value Date/Time   SDES KNEE 01/11/2024 2130   SPECREQUEST NONE 01/11/2024 2130   CULT  01/11/2024 2130    ABUNDANT STAPHYLOCOCCUS AUREUS SUSCEPTIBILITIES TO FOLLOW Performed at  [facility], [address] [place], [place] [address]    REPTSTATUS PENDING 01/11/2024 2130    Medications:  cefTRIAXone  (ROCEPHIN )  IV 2 g (01/13/24 1056)   vancomycin  1,000 mg (01/10/24 1851)    allopurinol   200 mg Oral Daily   Chlorhexidine  Gluconate Cloth  6 each Topical Q0600   Chlorhexidine  Gluconate Cloth  6 each Topical Q0600   heparin  injection (subcutaneous)  5,000 Units Subcutaneous Q8H   levothyroxine   112 mcg Oral Q0600   lidocaine   1 patch Transdermal Q24H   modafinil   200 mg Oral Daily   pantoprazole   40 mg Oral Daily   rosuvastatin   10 mg Oral Daily   sevelamer  carbonate  800 mg Oral TID WC    Dialysis Orders: TTS [facility] 3.75hrs, 400/AF 1.5, 84.6kg 2K, 2.5Ca LU AVF No heparin  Calcitriol  1mcg qHD Venofer 50mg  qwk     Assessment/ Plan: R knee septic prepatellar bursitis: ID following, on vanc/ cefepime , flagyl  discontinued.   AMS - improved Hypotension: soft bp's, got IVF x 1 L, midodrine started by PMD for BP support.  ESRD: on HD  TTS. Has not missed HD. Next HD on 01/13/24.  Volume: CXR was clear, close to dry wt, mild vol excess. Lower vol as tol w/ HD.  Anemia of esrd: Hb 10-12 here, no esa needs. Will follow.  Secondary hyperparathyroidism - CCa in goal.  Phos elevated. Continue home meds.  Recurrent ascites: possibly d/t heart failure. Prior w/u did not find liver disease. Gets LVP regularly in OP setting.  S/p 3.6L LVP 01/12/2024 Nutrition - renal diet w/fluid restrictions.  Almarie Bonine MD [facility] 01/13/2024,1:16 PM  LOS: 5 days

## 2024-01-13 NOTE — Evaluation (Signed)
 Occupational Therapy Evaluation Patient Details Name: [name] MRN: [id number] DOB: April 28, 1950 Today's Date: 01/13/2024   History of Present Illness   73 y.o. male adm 01/08/24 with SIRS, encephalopathy, Rt knee pain and bursitis. 10/5 Rt knee arthroscopy. 10/6 paracentesis. PMHx: cervical myelopathy, CKD, CAD s/p CABG, DMII, GERD, HTN, prostate CA, neuropathy, HLD, ESRD on HD TTS, Bil BKA, Afib, gout     Clinical Impressions Pt at PLOF uses 4ww and bil prothesis and was going out to OP PT3 x a week. At this time pt was very tired but wanting to get OOB to chair to have breakfast. Pt completed bed mobility with moderate assist and lateral scoot with max assist to chair with max cues due to posterior tilt. Pt then was set up for breakfast. Patient will benefit from intensive inpatient follow-up therapy, >3 hours/day.     If plan is discharge home, recommend the following:   Two people to help with walking and/or transfers;A lot of help with bathing/dressing/bathroom;Assistance with cooking/housework;Assist for transportation;Help with stairs or ramp for entrance     Functional Status Assessment   Patient has had a recent decline in their functional status and demonstrates the ability to make significant improvements in function in a reasonable and predictable amount of time.     Equipment Recommendations    (TBD)     Recommendations for Other Services   Rehab consult     Precautions/Restrictions   Precautions Precautions: Fall Required Braces or Orthoses: Other Brace Other Brace: Bil prothesis Restrictions Weight Bearing Restrictions Per Provider Order: No     Mobility Bed Mobility Overal bed mobility: Needs Assistance Bed Mobility: Supine to Sit     Supine to sit: Mod assist     General bed mobility comments: needs max cues on hand placement    Transfers                   General transfer comment: completed lateral scooting to chair with x2  attempts as pt lethargic in session with lateral scott to chair      Balance Overall balance assessment: Needs assistance Sitting-balance support: Feet supported, Bilateral upper extremity supported Sitting balance-Leahy Scale: Fair Sitting balance - Comments: heavy posterior tilt Postural control: Posterior lean                                 ADL either performed or assessed with clinical judgement   ADL Overall ADL's : Needs assistance/impaired Eating/Feeding: Minimal assistance;Sitting Eating/Feeding Details (indicate cue type and reason): set up of opening items Grooming: Wash/dry face;Contact guard assist;Sitting   Upper Body Bathing: Contact guard assist;Sitting   Lower Body Bathing: Maximal assistance;Total assistance;Bed level   Upper Body Dressing : Contact guard assist;Minimal assistance;Sitting   Lower Body Dressing: Total assistance;Maximal assistance       Toileting- Clothing Manipulation and Hygiene: Maximal assistance;Sitting/lateral lean               Vision Baseline Vision/History: 1 Wears glasses       Perception         Praxis         Pertinent Vitals/Pain Pain Assessment Pain Assessment: Faces Faces Pain Scale: Hurts a little bit Pain Location: right knee Pain Descriptors / Indicators: Grimacing, Guarding Pain Intervention(s): Limited activity within patient's tolerance, Monitored during session, Repositioned     Extremity/Trunk Assessment Upper Extremity Assessment Upper Extremity Assessment: LUE deficits/detail LUE Deficits /  Details: Pt reported decrease in FM skills and this is recent as noted decrease in ability to make full first and digital extension LUE Coordination: decreased fine motor;decreased gross motor   Lower Extremity Assessment Lower Extremity Assessment: Defer to PT evaluation   Cervical / Trunk Assessment Cervical / Trunk Assessment: Kyphotic   Communication Communication Communication: No  apparent difficulties   Cognition Arousal: Alert, Lethargic Behavior During Therapy: WFL for tasks assessed/performed, Flat affect               OT - Cognition Comments: pt very lethargic in session                 Following commands: Intact       Cueing  General Comments   Cueing Techniques: Verbal cues      Exercises     Shoulder Instructions      Home Living Family/patient expects to be discharged to:: Private residence Living Arrangements: Spouse/significant other Available Help at Discharge: Family;Available 24 hours/day Type of Home: House Home Access: Ramped entrance     Home Layout: Multi-level;Able to live on main level with bedroom/bathroom (uses chair lift) Alternate Level Stairs-Number of Steps: 1 Alternate Level Stairs-Rails: None Bathroom Shower/Tub: Walk-in shower     Bathroom Accessibility: No   Home Equipment: Agricultural consultant (2 wheels);Shower seat;Rollator (4 wheels);Wheelchair - manual;BSC/3in1;Grab bars - toilet;Grab bars - tub/shower;Hand held shower head   Additional Comments: Goes to Outpt PT 3 x week      Prior Functioning/Environment Prior Level of Function : Independent/Modified Independent             Mobility Comments: walks with rollator and prostheses, uses rails in bathroom as rollator won't fit ADLs Comments: ADLs independently, wife assists with IADLs, doesn't drive    OT Problem List: Decreased strength;Decreased activity tolerance;Impaired balance (sitting and/or standing);Decreased safety awareness;Decreased knowledge of use of DME or AE;Pain   OT Treatment/Interventions: Self-care/ADL training;Therapeutic exercise;DME and/or AE instruction;Therapeutic activities;Patient/family education;Balance training      OT Goals(Current goals can be found in the care plan section)   Acute Rehab OT Goals Patient Stated Goal: to eat OT Goal Formulation: With patient Time For Goal Achievement: 01/27/24 Potential to  Achieve Goals: Good   OT Frequency:  Min 2X/week    Co-evaluation              AM-PAC OT 6 Clicks Daily Activity     Outcome Measure Help from another person eating meals?: A Little Help from another person taking care of personal grooming?: A Little Help from another person toileting, which includes using toliet, bedpan, or urinal?: A Lot Help from another person bathing (including washing, rinsing, drying)?: A Lot Help from another person to put on and taking off regular upper body clothing?: A Little Help from another person to put on and taking off regular lower body clothing?: A Lot 6 Click Score: 15   End of Session Equipment Utilized During Treatment: Gait belt Nurse Communication: Mobility status  Activity Tolerance: Patient tolerated treatment well Patient left: in chair;with call bell/phone within reach;with chair alarm set  OT Visit Diagnosis: Unsteadiness on feet (R26.81);Other abnormalities of gait and mobility (R26.89);Repeated falls (R29.6);Muscle weakness (generalized) (M62.81);Pain Pain - Right/Left: Right Pain - part of body: Knee                Time: 9262-9186 OT Time Calculation (min): 36 min Charges:  OT General Charges $OT Visit: 1 Visit OT Evaluation $OT Eval Moderate Complexity: 1 Mod  OT Treatments $Self Care/Home Management : 8-22 mins  Warrick POUR OTR/L  Acute Rehab Services  [phone] office number   Jance Siek 01/13/2024, 9:24 AM

## 2024-01-14 ENCOUNTER — Inpatient Hospital Stay (HOSPITAL_COMMUNITY): Admission: RE | Admit: 2024-01-14 | Source: Ambulatory Visit

## 2024-01-14 ENCOUNTER — Encounter (HOSPITAL_COMMUNITY): Payer: Self-pay

## 2024-01-14 DIAGNOSIS — M71161 Other infective bursitis, right knee: Secondary | ICD-10-CM

## 2024-01-14 DIAGNOSIS — R6510 Systemic inflammatory response syndrome (SIRS) of non-infectious origin without acute organ dysfunction: Secondary | ICD-10-CM | POA: Diagnosis not present

## 2024-01-14 DIAGNOSIS — A4101 Sepsis due to Methicillin susceptible Staphylococcus aureus: Principal | ICD-10-CM

## 2024-01-14 LAB — BODY FLUID CULTURE W GRAM STAIN

## 2024-01-14 MED ORDER — IBUPROFEN 200 MG PO TABS
400.0000 mg | ORAL_TABLET | Freq: Once | ORAL | Status: AC
  Administered 2024-01-14: 400 mg via ORAL
  Filled 2024-01-14: qty 2

## 2024-01-14 MED ORDER — CEFAZOLIN SODIUM-DEXTROSE 2-4 GM/100ML-% IV SOLN
2.0000 g | INTRAVENOUS | Status: DC
  Administered 2024-01-15: 2 g via INTRAVENOUS
  Filled 2024-01-14: qty 100

## 2024-01-14 NOTE — Progress Notes (Addendum)
 PROGRESS NOTE    [name]  FMW:[id number] DOB: Oct 30, 1950 DOA: 01/08/2024 PCP: [name], NP   Brief Narrative:   73 year old male with multiple medical and cardiac history.  Patient carries diagnosis of gout, hypertension, diabetes mellitus, prior history of CABG and ESRD on hemodialysis TTS.  Patient is s/p bilateral BKA.  Patient was admitted with SIRS, encephalopathy, right knee pain with associated redness of the skin.  Patient underwent arthroscopy of the right knee at the emergency room and analysis was negative for crystals, with WBC of 3.  Cultures have remained negative to date.  Patient is currently on IV vancomycin , Flagyl  and cefepime .  Uric acid was 4.2.  Follow final cultures. ID consulted. S/p 2 cc of purulent fluid aspiration from prepatellar septic bursa on 10/5, f/u culture results. Pending SNF placement.  Assessment & Plan:  Principal Problem:   SIRS (systemic inflammatory response syndrome) (HCC) Active Problems:   Permanent atrial fibrillation (HCC)   Encephalopathy   Hypotension   Ascites   CAD (coronary artery disease)   ESRD on dialysis (HCC)   Right knee pain   Insulin  dependent type 2 diabetes mellitus (HCC)   Anemia of chronic disease   Thrombocytopenia   Essential hypertension   Pressure injury of skin   Gastroesophageal reflux disease without esophagitis   (HFpEF) heart failure with preserved ejection fraction (HCC)   SIRS (systemic inflammatory response syndrome) /cellulitis around right knee area/right prepatellar septic bursitis likely secondary to S.aureus:  -CT chest abdomen pelvis with otherwise stable findings -Started on empiric Rocephin  and vancomycin  in the ER.  Broadened to cefepime , vanc and flagyl  by the admitting team. Now, on IV ancef  only with end date of 10/29 as recommended by ID. - Synovial fluid analysis from 10/5 is growing S.Aureus - Uric acid of 4.2. -s/p 2 cc of purulent fluid were aspirated from the prepatellar  septic bursa by Dr Germaine on 10/5. - Infectious disease team ob board. Right prepatellar septic bursitis diagnosis has been made.  - Encephalopathy has resolved   Hypotension: resolved now -On presentation, SBP in the 80s despite fluid bolus in setting of SIRS  Continue midodrine 10 mg p.o. 3 times daily (as needed). Systolic blood pressure improved to 130+ millimeters mercury.   Continue to monitor closely    Encephalopathy, possibly combined toxic and metabolic: Resolved now -Suspect multifactorial with contributions of SIRS, ESRD, hypotension -Ammonia level came back normal.   Permanent atrial fibrillation  -Rate controlled atrial fibrillation on presentation  -Not an anticoagulation in setting of hx/o GU bleeding    Ascites -Recurrent ascites on CT     ESRD on dialysis   Continue hemodialysis TTS.   CAD Baseline history of CAD status post CABG Stable EKG stable    Insulin  dependent type 2 diabetes mellitus  on SSI for now    Anemia of chronic disease Last hemoglobin of 10.6 g/dL.   Near baseline  No overt signs of bleeding at present  Monitor    Thrombocytopenia Monitor   Essential hypertension/hypotension -Antihypertensives are on hold. - Continue to monitor closely    (HFpEF) heart failure with preserved ejection fraction: NYHA III 2D ECHO 11/2022 with EF 60-65%, grade 1 mitral regurgitation, moderate TR, mild PH + volume overload and hypotension on presentation in setting of SIRS, ESRD, recurrent ascites    01/09/2024: Repeat echo revealed ejection fraction of 55 to 60%, left ventricular diastolic function could not be evaluated, interventricular septum is flattened in systole and diastole, consistent  with right ventricular pressure and volume overload, dilated inferior vena cava with less than 50% respiratory variability.   Patient is on hemodialysis TTS.   Gastroesophageal reflux disease without esophagitis PPI    Disposition: Lives at home with  wife. He is agreeable to go to SNF now.  DVT prophylaxis: heparin  injection 5,000 Units Start: 01/13/24 1100 SCDs Start: 01/08/24 0534     Code Status: Full Code Family Communication:  None at the bedside Status is: Inpatient Remains inpatient appropriate because: knee infection    Subjective:  He was sitting in the chair. He is complaining of redness of right knee but no significant pain. He tried shrinker yesterday but it didn't fit. He wants to go to rehab on discharge as his wife can't take care of him at home.  Examination:  General exam: Appears calm and comfortable  Respiratory system: Clear to auscultation. Respiratory effort normal. Cardiovascular system: S1 & S2 heard, RRR. No JVD, murmurs, rubs, gallops or clicks. No pedal edema. Gastrointestinal system: Abdomen is nondistended, soft and nontender. No organomegaly or masses felt. Normal bowel sounds heard. Central nervous system: Alert and oriented. No focal neurological deficits. Extremities: S/p b/l BKA Skin: right knee is slightly swollen and erythematous but non-tender Psychiatry: Judgement and insight appear normal. Mood & affect appropriate.      Diet Orders (From admission, onward)     Start     Ordered   01/13/24 1328  Diet regular Room service appropriate? Yes; Fluid consistency: Thin; Fluid restriction: 1200 mL Fluid  Diet effective now       Question Answer Comment  Room service appropriate? Yes   Fluid consistency: Thin   Fluid restriction: 1200 mL Fluid      01/13/24 1327            Objective: Vitals:   01/13/24 1936 01/13/24 2254 01/14/24 0411 01/14/24 0719  BP: (!) 120/39 (!) 135/46 (!) 137/54   Pulse: 74 68 77 66  Resp: 14 (!) 23 19 15   Temp: 98.8 F (37.1 C) 97.7 F (36.5 C)  (!) 97.3 F (36.3 C)  TempSrc: Oral Oral Oral Oral  SpO2: 100% 97% 94% 100%  Weight:      Height:        Intake/Output Summary (Last 24 hours) at 01/14/2024 0937 Last data filed at 01/14/2024 0600 Gross  per 24 hour  Intake 240 ml  Output 2040 ml  Net -1800 ml   Filed Weights   01/13/24 0321 01/13/24 1412 01/13/24 1810  Weight: 89.3 kg 85.8 kg 83.6 kg    Scheduled Meds:  allopurinol   200 mg Oral Daily   Chlorhexidine  Gluconate Cloth  6 each Topical Q0600   Chlorhexidine  Gluconate Cloth  6 each Topical Q0600   heparin  injection (subcutaneous)  5,000 Units Subcutaneous Q8H   levothyroxine   112 mcg Oral Q0600   lidocaine   1 patch Transdermal Q24H   modafinil   200 mg Oral Daily   pantoprazole   40 mg Oral Daily   rosuvastatin   10 mg Oral Daily   sevelamer  carbonate  800 mg Oral TID WC   Continuous Infusions:  [START ON 01/15/2024]  ceFAZolin  (ANCEF ) IV      Nutritional status     Body mass index is 25.71 kg/m.  Data Reviewed:   CBC: Recent Labs  Lab 01/08/24 0046 01/08/24 0541 01/10/24 0258  WBC 11.6* 15.1* 11.8*  NEUTROABS 10.6* 13.5* 10.1*  HGB 12.0* 10.5* 10.6*  HCT 38.0* 34.2* 33.3*  MCV 88.8 91.4 87.6  PLT 148* 141* 127*   Basic Metabolic Panel: Recent Labs  Lab 01/08/24 0046 01/08/24 0541 01/10/24 0258 01/11/24 0256 01/12/24 0311 01/13/24 0500  NA 136 134* 137  --   --   --   K 5.7* 4.6 5.1  --   --   --   CL 99 96* 98  --   --   --   CO2 22 21* 22  --   --   --   GLUCOSE 76 101* 98  --   --   --   BUN 58* 55* 54*  --   --   --   CREATININE 6.73* 6.47* 6.90*  --   --   --   CALCIUM  7.3* 6.6* 7.7*  --   --   --   MG  --  1.0*  --  2.1 2.2 2.0  PHOS  --  5.2* 6.0*  --   --   --    GFR: Estimated Creatinine Clearance: 10 mL/min (A) (by C-G formula based on SCr of 6.9 mg/dL (H)). Liver Function Tests: Recent Labs  Lab 01/08/24 0046 01/08/24 0541 01/10/24 0258  AST 22  --   --   ALT 12  --   --   ALKPHOS 165*  --   --   BILITOT 1.0  --   --   PROT 4.9*  --   --   ALBUMIN  2.3* 1.7* 2.0*   No results for input(s): LIPASE, AMYLASE in the last 168 hours. Recent Labs  Lab 01/08/24 1025  AMMONIA 31   Coagulation Profile: Recent Labs   Lab 01/08/24 0046  INR 1.1   Cardiac Enzymes: No results for input(s): CKTOTAL, CKMB, CKMBINDEX, TROPONINI in the last 168 hours. BNP (last 3 results) Recent Labs    10/28/23 2109  PROBNP >35,000.0*   HbA1C: No results for input(s): HGBA1C in the last 72 hours. CBG: Recent Labs  Lab 01/12/24 1104 01/12/24 1602 01/12/24 2150 01/13/24 0624 01/13/24 1055  GLUCAP 89 70 125* 101* 138*   Lipid Profile: No results for input(s): CHOL, HDL, LDLCALC, TRIG, CHOLHDL, LDLDIRECT in the last 72 hours. Thyroid  Function Tests: No results for input(s): TSH, T4TOTAL, FREET4, T3FREE, THYROIDAB in the last 72 hours. Anemia Panel: No results for input(s): VITAMINB12, FOLATE, FERRITIN, TIBC, IRON, RETICCTPCT in the last 72 hours. Sepsis Labs: Recent Labs  Lab 01/08/24 0053 01/08/24 0247  LATICACIDVEN 2.0* 2.2*    Recent Results (from the past 240 hours)  Culture, blood (Routine x 2)     Status: None   Collection Time: 01/08/24 12:46 AM   Specimen: BLOOD RIGHT ARM  Result Value Ref Range Status   Specimen Description BLOOD RIGHT ARM  Final   Special Requests   Final    BOTTLES DRAWN AEROBIC AND ANAEROBIC Blood Culture adequate volume   Culture   Final    NO GROWTH 5 DAYS Performed at [facility], [address], [place], [place] [address]    Report Status 01/13/2024 FINAL  Final  Culture, blood (Routine x 2)     Status: None   Collection Time: 01/08/24 12:46 AM   Specimen: BLOOD RIGHT ARM  Result Value Ref Range Status   Specimen Description BLOOD RIGHT ARM  Final   Special Requests   Final    BOTTLES DRAWN AEROBIC AND ANAEROBIC Blood Culture results may not be optimal due to an inadequate volume of blood received in culture bottles   Culture   Final  NO GROWTH 5 DAYS Performed at [facility], [address] [place], [place] [address]    Report Status 01/13/2024 FINAL  Final  Body fluid culture w Gram Stain      Status: None   Collection Time: 01/08/24  1:51 AM   Specimen: KNEE; Body Fluid  Result Value Ref Range Status   Specimen Description KNEE  Final   Special Requests NONE  Final   Gram Stain   Final    RARE WBC PRESENT, PREDOMINANTLY PMN NO ORGANISMS SEEN    Culture   Final    NO GROWTH 3 DAYS Performed at [facility], [address] [place], [place] [address]    Report Status 01/11/2024 FINAL  Final  Resp panel by RT-PCR (RSV, Flu A&B, Covid) KNEE     Status: None   Collection Time: 01/08/24  1:51 AM   Specimen: KNEE; Nasal Swab  Result Value Ref Range Status   SARS Coronavirus 2 by RT PCR NEGATIVE NEGATIVE Final   Influenza A by PCR NEGATIVE NEGATIVE Final   Influenza B by PCR NEGATIVE NEGATIVE Final    Comment: (NOTE) The Xpert Xpress SARS-CoV-2/FLU/RSV plus assay is intended as an aid in the diagnosis of influenza from Nasopharyngeal swab specimens and should not be used as a sole basis for treatment. Nasal washings and aspirates are unacceptable for Xpert Xpress SARS-CoV-2/FLU/RSV testing.  Fact Sheet for Patients: BloggerCourse.com  Fact Sheet for Healthcare Providers: SeriousBroker.it  This test is not yet approved or cleared by the [place]  FDA and has been authorized for detection and/or diagnosis of SARS-CoV-2 by FDA under an Emergency Use Authorization (EUA). This EUA will remain in effect (meaning this test can be used) for the duration of the COVID-19 declaration under Section 564(b)(1) of the Act, 21 U.S.C. section 360bbb-3(b)(1), unless the authorization is terminated or revoked.     Resp Syncytial Virus by PCR NEGATIVE NEGATIVE Final    Comment: (NOTE) Fact Sheet for Patients: BloggerCourse.com  Fact Sheet for Healthcare Providers: SeriousBroker.it  This test is not yet approved or cleared by the [place]  FDA and has been authorized for  detection and/or diagnosis of SARS-CoV-2 by FDA under an Emergency Use Authorization (EUA). This EUA will remain in effect (meaning this test can be used) for the duration of the COVID-19 declaration under Section 564(b)(1) of the Act, 21 U.S.C. section 360bbb-3(b)(1), unless the authorization is terminated or revoked.  Performed at [facility], [address], [place], [place] [address]   Body fluid culture w Gram Stain     Status: None   Collection Time: 01/11/24  9:30 PM   Specimen: KNEE; Body Fluid  Result Value Ref Range Status   Specimen Description KNEE  Final   Special Requests NONE  Final   Gram Stain   Final    FEW WBC PRESENT, PREDOMINANTLY PMN MODERATE GRAM POSITIVE COCCI Performed at [facility], [address] [place], [place] [address]    Culture ABUNDANT STAPHYLOCOCCUS AUREUS  Final   Report Status 01/14/2024 FINAL  Final   Organism ID, Bacteria STAPHYLOCOCCUS AUREUS  Final      Susceptibility   Staphylococcus aureus - MIC*    CIPROFLOXACIN  <=0.5 SENSITIVE Sensitive     ERYTHROMYCIN <=0.25 SENSITIVE Sensitive     GENTAMICIN <=0.5 SENSITIVE Sensitive     OXACILLIN 0.5 SENSITIVE Sensitive     TETRACYCLINE <=1 SENSITIVE Sensitive     VANCOMYCIN  1 SENSITIVE Sensitive  TRIMETH /SULFA  <=10 SENSITIVE Sensitive     CLINDAMYCIN <=0.25 SENSITIVE Sensitive     RIFAMPIN <=0.5 SENSITIVE Sensitive     Inducible Clindamycin NEGATIVE Sensitive     LINEZOLID 2 SENSITIVE Sensitive     * ABUNDANT STAPHYLOCOCCUS AUREUS         Radiology Studies: IR Paracentesis Result Date: 01/12/2024 INDICATION: History of end-stage renal disease with recurrent ascites. Request for therapeutic paracentesis. EXAM: ULTRASOUND GUIDED THERAPEUTIC PARACENTESIS MEDICATIONS: 8 mL 1% lidocaine . COMPLICATIONS: None immediate. PROCEDURE: Informed written consent was obtained from the patient after a discussion of the risks, benefits and alternatives to treatment. A timeout was  performed prior to the initiation of the procedure. Initial ultrasound scanning demonstrates a large amount of ascites within the left lower abdominal quadrant. The left lower abdomen was prepped and draped in the usual sterile fashion. 1% lidocaine  was used for local anesthesia. Following this, a 19 gauge, 7-cm, Yueh catheter was introduced. An ultrasound image was saved for documentation purposes. The paracentesis was performed. The catheter was removed and a dressing was applied. The patient tolerated the procedure well without immediate post procedural complication. FINDINGS: A total of approximately 3.6 liters of straw colored fluid was removed. IMPRESSION: Successful ultrasound-guided paracentesis yielding 3.6 liters of peritoneal fluid. Performed by: Wyatt Pommier, PA-C Electronically Signed   By: CHRISTELLA.  Shick M.D.   On: 01/12/2024 15:33           LOS: 6 days   Time spent= 40 mins    Deliliah Room, MD [facility]  If 7PM-7AM, please contact night-coverage  01/14/2024, 9:37 AM

## 2024-01-14 NOTE — Progress Notes (Signed)
 [facility]  Date of Admission:  01/08/2024    [name] is a 73 y.o. male with past medical history of ESRD on HD, permanent atrial fibrillation not on AC due to GI bleed, chronic anemia, CAD status post CABG in 2005, DMT2, PAD status post bilateral BKA, and recurrent ascites requiring frequent paracentesis who presented to the ED on 01/08/2024 with altered mental status and knee pain. He was  admitted with SIRS, hypotension, encephalopathy, ascites, and right knee pain. On admit wbc 15, lactate 2.2 T 100.1. Right knee synovial fluid obtained in the ED showed 3 WBCs and no crystals. CX ngtd BCX neg.  CT abd with mult chronic findings including ascites and LAN but nothing acute Has a L BKA stump which did not appear infected and a healing wound suerpior to L knee also not infected. Clinically improving on vancomycin  and cefepime  and metronidazole  Reports that he wears Bil BKA prosthesis.  Able to flex and extend R knee now but still swelling over bursa    Media Information  Document Information  Photos  Right knee  01/14/2024 11:41  Attached To:  Hospital Encounter on 01/08/24  Source Information  Dontravious Camille A, MD  [facility]  ASSESSMENT: Right knee septic bursitis. Right knee cultures cultures grew MSSA. Patient still has some pain and discomfort in the right knee joint area. He is afebrile. Blood cultures are negative so far.   Patient is on IV ceftriaxone . He has decreased range of motion in the right knee joint area. Status post bilateral BKA. Patient states that he has had at least 3 knee aspirates.  Patient is worried that he might lose his right knee. ESRD on hemodialysis. Patient appears comfortable.  He is very anxious.  PLAN:  DC ceftriaxone . Recommend IV Ancef , dose as per pharmacy. Weekly CBC, CMP, ESR, CRP while the patient is on antibiotics. Recommend at least 4-week duration of antibiotics. Follow-up with  ID as an outpatient in 2 to 3 weeks.  Patient states he used to follow-up with Dr. Dennise in the past for his ID issues. Ortho follow-up.  Let the patient be cleared by Ortho before he can be discharged. If patient does not improve, he knows that he needs to come back to the hospital immediately.  Patient might then need additional intervention by Ortho and the antibiotics probably could be switched over to nafcillin if it comes back MSSA.  At times the MSSA can become MRSA.  These issues will be addressed by the ID team in future.  Principal Problem:   SIRS (systemic inflammatory response syndrome) (HCC) Active Problems:   Essential hypertension   CAD (coronary artery disease)   Insulin  dependent type 2 diabetes mellitus (HCC)   ESRD on dialysis (HCC)   Pressure injury of skin   Anemia of chronic disease   Permanent atrial fibrillation (HCC)   Thrombocytopenia   Encephalopathy   Ascites   Gastroesophageal reflux disease without esophagitis   Hypotension   (HFpEF) heart failure with preserved ejection fraction (HCC)   Right knee pain    allopurinol   200 mg Oral Daily   Chlorhexidine  Gluconate Cloth  6 each Topical Q0600   Chlorhexidine  Gluconate Cloth  6 each Topical Q0600   heparin  injection (subcutaneous)  5,000 Units Subcutaneous Q8H   levothyroxine   112 mcg Oral Q0600   lidocaine   1 patch Transdermal Q24H   modafinil   200 mg Oral Daily  pantoprazole   40 mg Oral Daily   rosuvastatin   10 mg Oral Daily   sevelamer  carbonate  800 mg Oral TID WC    SUBJECTIVE: Patient states he feels better but he is really concerned about the redness in the right knee.  Review of Systems: Patient complains of discomfort in the right knee joint. Review of Systems  Constitutional:  Positive for malaise/fatigue.  HENT: Negative.    Eyes: Negative.   Respiratory: Negative.    Cardiovascular: Negative.   Gastrointestinal: Negative.   Genitourinary: Negative.   Musculoskeletal:  Positive for  joint pain.  Skin:  Positive for rash.  Neurological: Negative.   Endo/Heme/Allergies: Negative.   Psychiatric/Behavioral: Negative.      Allergies  Allergen Reactions   Morphine  Other (See Comments)    Other reaction(s): Delusions (intolerance)  Other Reaction(s): delusions   Mushroom Extract Complex (Obsolete) Nausea Only    OBJECTIVE: Vitals:   01/13/24 1936 01/13/24 2254 01/14/24 0411 01/14/24 0719  BP: (!) 120/39 (!) 135/46 (!) 137/54   Pulse: 74 68 77 66  Resp: 14 (!) 23 19 15   Temp: 98.8 F (37.1 C) 97.7 F (36.5 C)  (!) 97.3 F (36.3 C)  TempSrc: Oral Oral Oral Oral  SpO2: 100% 97% 94% 100%  Weight:      Height:       Body mass index is 25.71 kg/m.  Physical Exam HENT:     Right Ear: External ear normal.     Left Ear: External ear normal.     Mouth/Throat:     Mouth: Mucous membranes are moist.  Eyes:     Pupils: Pupils are equal, round, and reactive to light.  Cardiovascular:     Rate and Rhythm: Normal rate.     Heart sounds: Normal heart sounds.  Pulmonary:     Effort: Pulmonary effort is normal.     Breath sounds: Normal breath sounds.  Abdominal:     General: Bowel sounds are normal.     Palpations: Abdomen is soft.  Musculoskeletal:        General: Deformity present.     Cervical back: Normal range of motion.  Skin:    Findings: Lesion and rash present.     Comments: Right knee joint swelling present.  Erythema present.  Decreased range of motion present.  Neurological:     Mental Status: He is alert and oriented to person, place, and time. Mental status is at baseline.  Psychiatric:        Mood and Affect: Mood normal.        Behavior: Behavior normal.        Thought Content: Thought content normal.        Judgment: Judgment normal.   Left upper extremity AV fistula present, bruit present, thrill present.  Lab Results Lab Results  Component Value Date   WBC 11.8 (H) 01/10/2024   HGB 10.6 (L) 01/10/2024   HCT 33.3 (L) 01/10/2024    MCV 87.6 01/10/2024   PLT 127 (L) 01/10/2024    Lab Results  Component Value Date   CREATININE 6.90 (H) 01/10/2024   BUN 54 (H) 01/10/2024   NA 137 01/10/2024   K 5.1 01/10/2024   CL 98 01/10/2024   CO2 22 01/10/2024    Lab Results  Component Value Date   ALT 12 01/08/2024   AST 22 01/08/2024   ALKPHOS 165 (H) 01/08/2024   BILITOT 1.0 01/08/2024     Microbiology: Recent Results (from the past 240  hours)  Culture, blood (Routine x 2)     Status: None   Collection Time: 01/08/24 12:46 AM   Specimen: BLOOD RIGHT ARM  Result Value Ref Range Status   Specimen Description BLOOD RIGHT ARM  Final   Special Requests   Final    BOTTLES DRAWN AEROBIC AND ANAEROBIC Blood Culture adequate volume   Culture   Final    NO GROWTH 5 DAYS Performed at [facility], [address] [place], [place] [address]    Report Status 01/13/2024 FINAL  Final  Culture, blood (Routine x 2)     Status: None   Collection Time: 01/08/24 12:46 AM   Specimen: BLOOD RIGHT ARM  Result Value Ref Range Status   Specimen Description BLOOD RIGHT ARM  Final   Special Requests   Final    BOTTLES DRAWN AEROBIC AND ANAEROBIC Blood Culture results may not be optimal due to an inadequate volume of blood received in culture bottles   Culture   Final    NO GROWTH 5 DAYS Performed at [facility], [address], [place], [place] [address]    Report Status 01/13/2024 FINAL  Final  Body fluid culture w Gram Stain     Status: None   Collection Time: 01/08/24  1:51 AM   Specimen: KNEE; Body Fluid  Result Value Ref Range Status   Specimen Description KNEE  Final   Special Requests NONE  Final   Gram Stain   Final    RARE WBC PRESENT, PREDOMINANTLY PMN NO ORGANISMS SEEN    Culture   Final    NO GROWTH 3 DAYS Performed at [facility], [address] [place], [place] [address]    Report Status 01/11/2024 FINAL  Final  Resp panel by RT-PCR (RSV, Flu A&B, Covid) KNEE     Status: None    Collection Time: 01/08/24  1:51 AM   Specimen: KNEE; Nasal Swab  Result Value Ref Range Status   SARS Coronavirus 2 by RT PCR NEGATIVE NEGATIVE Final   Influenza A by PCR NEGATIVE NEGATIVE Final   Influenza B by PCR NEGATIVE NEGATIVE Final    Comment: (NOTE) The Xpert Xpress SARS-CoV-2/FLU/RSV plus assay is intended as an aid in the diagnosis of influenza from Nasopharyngeal swab specimens and should not be used as a sole basis for treatment. Nasal washings and aspirates are unacceptable for Xpert Xpress SARS-CoV-2/FLU/RSV testing.  Fact Sheet for Patients: BloggerCourse.com  Fact Sheet for Healthcare Providers: SeriousBroker.it  This test is not yet approved or cleared by the [place]  FDA and has been authorized for detection and/or diagnosis of SARS-CoV-2 by FDA under an Emergency Use Authorization (EUA). This EUA will remain in effect (meaning this test can be used) for the duration of the COVID-19 declaration under Section 564(b)(1) of the Act, 21 U.S.C. section 360bbb-3(b)(1), unless the authorization is terminated or revoked.     Resp Syncytial Virus by PCR NEGATIVE NEGATIVE Final    Comment: (NOTE) Fact Sheet for Patients: BloggerCourse.com  Fact Sheet for Healthcare Providers: SeriousBroker.it  This test is not yet approved or cleared by the [place]  FDA and has been authorized for detection and/or diagnosis of SARS-CoV-2 by FDA under an Emergency Use Authorization (EUA). This EUA will remain in effect (meaning this test can be used) for the duration of the COVID-19 declaration under Section 564(b)(1) of the Act, 21 U.S.C. section 360bbb-3(b)(1), unless the authorization is terminated or revoked.  Performed at [facility], [address], [place], [place] [address]   Body fluid culture w Gram Stain     Status: None   Collection Time: 01/11/24  9:30  PM   Specimen: KNEE; Body Fluid  Result Value Ref Range Status   Specimen Description KNEE  Final   Special Requests NONE  Final   Gram Stain   Final    FEW WBC PRESENT, PREDOMINANTLY PMN MODERATE GRAM POSITIVE COCCI Performed at [facility], [address], [place], [place] [address]    Culture ABUNDANT STAPHYLOCOCCUS AUREUS  Final   Report Status 01/14/2024 FINAL  Final   Organism ID, Bacteria STAPHYLOCOCCUS AUREUS  Final      Susceptibility   Staphylococcus aureus - MIC*    CIPROFLOXACIN  <=0.5 SENSITIVE Sensitive     ERYTHROMYCIN <=0.25 SENSITIVE Sensitive     GENTAMICIN <=0.5 SENSITIVE Sensitive     OXACILLIN 0.5 SENSITIVE Sensitive     TETRACYCLINE <=1 SENSITIVE Sensitive     VANCOMYCIN  1 SENSITIVE Sensitive     TRIMETH /SULFA  <=10 SENSITIVE Sensitive     CLINDAMYCIN <=0.25 SENSITIVE Sensitive     RIFAMPIN <=0.5 SENSITIVE Sensitive     Inducible Clindamycin NEGATIVE Sensitive     LINEZOLID 2 SENSITIVE Sensitive     * ABUNDANT STAPHYLOCOCCUS AUREUS    Ditty Tiffny Gemmer, MD [facility] Cell [phone]  @TODAY @ 11:01 AM   Total Encounter Time: 50 minutes

## 2024-01-14 NOTE — Progress Notes (Signed)
 Palliative Medicine Progress Note   Patient Name: [name]       Date: 01/14/2024 DOB: 1950-10-18  Age: 73 y.o. MRN#: [id number] Attending Physician: Dino Antu, MD Primary Care Physician: [name], NP Admit Date: 01/08/2024   HPI/Patient Profile: 74 y.o. male  with past medical history of ESRD on HD, permanent atrial fibrillation not on AC due to GI bleed, chronic anemia, CAD status post CABG in 2005, DMT2, PAD status post bilateral BKA, and recurrent ascites requiring frequent paracentesis who presented to the ED on 01/08/2024 with altered mental status and knee pain.  He is admitted with SIRS, hypotension, encephalopathy, ascites, and right knee pain.  Right knee synovial fluid obtained in the ED showed 3 WBCs and no crystals.   Palliative Medicine has been consulted for goals of care discussions and complex medical decision making.   Subjective: Chart reviewed. ID was consulted and right knee synovial fluid from 10/5 was positive for MSSA. He is medically stable for discharge.    Patient assessed. His mental status has improved, seems back to baseline. He has no new complaints and is looking forward to being discharged soon (likely tomorrow).    Objective:  Physical Exam Vitals reviewed.  Constitutional:      General: He is not in acute distress.    Comments: Chronically ill-appearing  Musculoskeletal:     Right Lower Extremity: Right leg is amputated below knee.     Left Lower Extremity: Left leg is amputated below knee.  Neurological:     Mental Status: He is alert and oriented to person, place, and time.             Palliative Medicine Assessment & Plan   Assessment: Principal Problem:   SIRS (systemic inflammatory response syndrome) (HCC) Active  Problems:   Essential hypertension   CAD (coronary artery disease)   Insulin  dependent type 2 diabetes mellitus (HCC)   ESRD on dialysis (HCC)   Pressure injury of skin   Anemia of chronic disease   Permanent atrial fibrillation (HCC)   Thrombocytopenia   Encephalopathy   Ascites   Gastroesophageal reflux disease without esophagitis   Hypotension   (HFpEF) heart failure with preserved ejection fraction (HCC)   Right knee pain    Recommendations/Plan: Full scope care Goal is recovery to the greatest extent  possible Discharge plan is for rehab to improve strength and mobility prior to returning home   Code Status: Full code   Prognosis:  Unable to determine    Thank you for allowing the Palliative Medicine Team to assist in the care of this patient.   Time: 26 minutes   Recardo KATHEE Loll, NP   Please contact Palliative Medicine Team phone at [phone] for questions and concerns.  For individual providers, please see AMION.

## 2024-01-14 NOTE — NC FL2 (Addendum)
 [place]  MEDICAID FL2 LEVEL OF CARE FORM     IDENTIFICATION  Patient Name: [name] Birthdate: 09/20/1950 Sex: male Admission Date (Current Location): 01/08/2024  [facility] and [place] Number:  Producer, television/film/video and Address:  [facility] [place], [address], [place], [place] [address]      Provider Number: [id number]  Attending Physician Name and Address:  Dino Antu, MD  Relative Name and Phone Number:  Adien, Kimmel [facility])  [phone]    Current Level of Care: Hospital Recommended Level of Care: Skilled Nursing Facility Prior Approval Number:    Date Approved/Denied:   PASRR Number: [id number]  Discharge Plan: SNF    Current Diagnoses: Patient Active Problem List   Diagnosis Date Noted   SIRS (systemic inflammatory response syndrome) (HCC) 01/08/2024   Hypotension 01/08/2024   (HFpEF) heart failure with preserved ejection fraction (HCC) 01/08/2024   Right knee pain 01/08/2024   Gastroesophageal reflux disease without esophagitis 12/22/2023   CAP (community acquired pneumonia) 10/29/2023   Acute respiratory failure with hypoxia (HCC) 05/05/2023   Ascites 05/05/2023   Hyponatremia 05/05/2023   Right below-knee amputee (HCC) 07/29/2022   Sepsis due to pneumonia (HCC) 07/23/2022   Foot infection 07/22/2022   Acute respiratory failure (HCC) 07/22/2022   Anemia due to chronic blood loss 06/23/2022   Heme positive stool 06/23/2022   Benign neoplasm of cecum 06/23/2022   Benign neoplasm of transverse colon 06/23/2022   Benign neoplasm of descending colon 06/23/2022   Abscess of right foot 05/17/2022   Contraindication to anticoagulation therapy GU Bleed 03/13/2022   Permanent atrial fibrillation (HCC) 08/18/2021   Anemia of chronic renal failure 08/18/2021   Thrombocytopenia 08/18/2021   Encephalopathy 08/18/2021   Action induced myoclonus 08/18/2021   Left below-knee amputee (HCC) 05/11/2021   Anemia of chronic disease 09/13/2020    COVID-19 virus infection 09/13/2020   Pressure injury of skin 06/29/2020   Gross hematuria 06/16/2020   Typical atrial flutter (HCC) 03/24/2020   Bilateral pleural effusion 02/24/2020   Healthcare maintenance 01/28/2020   Shortness of breath 01/28/2020   History of partial ray amputation of fourth toe of right foot 09/08/2019   Chronic cough 06/25/2019   Cutaneous abscess of right foot    Subacute osteomyelitis, right ankle and foot (HCC) 12/14/2018   AKI (acute kidney injury) 12/14/2018   ESRD on dialysis (HCC) 12/14/2018   S/P CABG (coronary artery bypass graft) 12/11/2018   Gait abnormality 08/24/2018   Diabetic neuropathy (HCC) 02/06/2018   Cervical myelopathy (HCC) 02/06/2018   Onychomycosis 10/30/2017   Other spondylosis with radiculopathy, cervical region 01/27/2017   Midfoot ulcer, right, limited to breakdown of skin (HCC) 11/15/2016   Lateral epicondylitis, left elbow 08/12/2016   Prostate cancer (HCC) 06/19/2016   Cellulitis of right foot 12/24/2015   Gout 09/14/2012   Essential hypertension 09/14/2012   Hypothyroidism 09/14/2012   CAD (coronary artery disease) 09/14/2012   Insulin  dependent type 2 diabetes mellitus (HCC) 09/14/2012   Hyperlipidemia associated with type 2 diabetes mellitus (HCC)    Neuropathy (HCC)     Orientation RESPIRATION BLADDER Height & Weight     Self, Time, Situation, Place  Normal Continent Weight:  (refused) Height:  5' 11 (180.3 cm)  BEHAVIORAL SYMPTOMS/MOOD NEUROLOGICAL BOWEL NUTRITION STATUS      Continent Diet (Please see discharge summary)  AMBULATORY STATUS COMMUNICATION OF NEEDS Skin   Extensive Assist Verbally Other (Comment) (Wound - Knee Anterior;Left)  Personal Care Assistance Level of Assistance  Bathing, Feeding, Dressing Bathing Assistance: Maximum assistance Feeding assistance: Limited assistance Dressing Assistance: Maximum assistance     Functional Limitations Info  Sight, Hearing,  Speech Sight Info: Impaired (eyeglasses) Hearing Info: Impaired Speech Info: Adequate    SPECIAL CARE FACTORS FREQUENCY  PT (By licensed PT), OT (By licensed OT)     PT Frequency: 5x week OT Frequency: 5x week            Contractures Contractures Info: Not present    Additional Factors Info  Code Status, Allergies Code Status Info: Full Allergies Info: Mushroom Extract Complex (obsolete), Morphine            Current Medications (01/14/2024):  This is the current hospital active medication list Current Facility-Administered Medications  Medication Dose Route Frequency Provider Last Rate Last Admin   acetaminophen  (TYLENOL ) tablet 650 mg  650 mg Oral Q6H PRN [name], [name], DO       Or   acetaminophen  (TYLENOL ) suppository 650 mg  650 mg Rectal Q6H PRN [name], [name], DO       allopurinol  (ZYLOPRIM ) tablet 200 mg  200 mg Oral Daily Eldonna Elspeth PARAS, MD   200 mg at 01/14/24 0817   [START ON 01/15/2024] ceFAZolin  (ANCEF ) IVPB 2g/100 mL premix  2 g Intravenous Q T,Th,Sa-HD Shetty, Dithi A, MD       Chlorhexidine  Gluconate Cloth 2 % PADS 6 each  6 each Topical Q0600 Geralynn [place], MD   6 each at 01/14/24 0608   Chlorhexidine  Gluconate Cloth 2 % PADS 6 each  6 each Topical Q0600 Geralynn [place], MD   6 each at 01/14/24 0607   heparin  injection 5,000 Units  5,000 Units Subcutaneous Q8H Rashid, Farhan, MD       HYDROmorphone  (DILAUDID ) injection 0.5 mg  0.5 mg Intravenous Q4H PRN McIlquham, Julia B, NP   0.5 mg at 01/11/24 1245   levothyroxine  (SYNTHROID ) tablet 112 mcg  112 mcg Oral Q0600 Eldonna Elspeth PARAS, MD   112 mcg at 01/14/24 0523   lidocaine  (LIDODERM ) 5 % 1 patch  1 patch Transdermal Q24H Rosario Eland I, MD   1 patch at 01/13/24 1246   modafinil  (PROVIGIL ) tablet 200 mg  200 mg Oral Daily Eldonna Elspeth PARAS, MD   200 mg at 01/14/24 0816   ondansetron  (ZOFRAN ) injection 4 mg  4 mg Intravenous Q6H PRN [name], [name], DO   4 mg at 01/11/24 1530   pantoprazole   (PROTONIX ) EC tablet 40 mg  40 mg Oral Daily Eldonna Elspeth PARAS, MD   40 mg at 01/14/24 0816   rosuvastatin  (CRESTOR ) tablet 10 mg  10 mg Oral Daily Eldonna Elspeth PARAS, MD   10 mg at 01/14/24 0816   sevelamer  carbonate (RENVELA ) tablet 800 mg  800 mg Oral TID WC Eldonna Elspeth PARAS, MD   800 mg at 01/14/24 9183     Discharge Medications: Please see discharge summary for a list of discharge medications.  Relevant Imaging Results:  Relevant Lab Results:   Additional Information SSN: [id number]; out-pt HD at [facility] on [facility] on TTS 7:00 am chair time.   Gennavieve Huq C Yeraldin Litzenberger, LCSWA

## 2024-01-14 NOTE — Progress Notes (Addendum)
 Physical Therapy Treatment Patient Details Name: [name] MRN: [id number] DOB: 04-Jan-1951 Today's Date: 01/14/2024   History of Present Illness 73 year old male admitted 10/2 with SIRS, encephalopathy, and right knee pain/?cellulitis.  Patient underwent arthroscopy of the right knee at the emergency room and analysis was negative for crystals, with WBC of 3. S/p 2 cc of purulent fluid aspiration from prepatellar septic bursa on 10/5/  Cultures pending.  Pt going for paracentesis 10/6.   EFY:hnlu, hypertension, diabetes mellitus, prior history of CABG and ESRD on hemodialysis TTS, ascitis.  Patient is s/p bilateral BKA.    PT Comments  Pt continues to be unable to don RLE prosthetic due to painful swollen rt knee. Today was able to don LLE prosthetic with assistance. Once lt prosthetic on pt with improved ability to perform lateral/scoot transfer from chair to bed to chair requiring min assist. Pt now agreeable to post acute rehab since he realizes he likely won't be able to don RLE prosthetic soon. At this time will focus on transfers and mobility with only his lt prosthetic in place and hopefully will achieve independence at w/c level until rt knee improves. Patient will benefit from continued inpatient follow up therapy, <3 hours/day.     If plan is discharge home, recommend the following: A lot of help with walking and/or transfers;A lot of help with bathing/dressing/bathroom;Assistance with cooking/housework;Assist for transportation;Help with stairs or ramp for entrance   Can travel by private vehicle        Equipment Recommendations  None recommended by PT    Recommendations for Other Services       Precautions / Restrictions Precautions Precautions: Fall Required Braces or Orthoses: Other Brace (Bil LE prostheses) Restrictions Weight Bearing Restrictions Per Provider Order: No     Mobility  Bed Mobility               General bed mobility comments: Pt up in recliner     Transfers Overall transfer level: Needs assistance Equipment used: None Transfers: Bed to chair/wheelchair/BSC            Lateral/Scoot Transfers: Min assist General transfer comment: Placed lt prosthetic on prior to transfer. Chair to bed to chair with min assist to scoot using bed pad under hips.    Ambulation/Gait                   Stairs             Wheelchair Mobility     Tilt Bed    Modified Rankin (Stroke Patients Only)       Balance Overall balance assessment: Needs assistance Sitting-balance support: No upper extremity supported, Feet supported Sitting balance-Leahy Scale: Fair                                      Hotel manager: No apparent difficulties  Cognition Arousal: Alert Behavior During Therapy: WFL for tasks assessed/performed   PT - Cognitive impairments: No apparent impairments                         Following commands: Intact      Cueing Cueing Techniques: Verbal cues  Exercises      General Comments General comments (skin integrity, edema, etc.): VSS      Pertinent Vitals/Pain Pain Assessment Pain Assessment: Faces Faces Pain Scale: Hurts a little bit Pain Location:  right knee Pain Descriptors / Indicators: Aching, Discomfort, Grimacing, Guarding Pain Intervention(s): Limited activity within patient's tolerance    Home Living                          Prior Function            PT Goals (current goals can now be found in the care plan section) Acute Rehab PT Goals Patient Stated Goal: to go home Progress towards PT goals: Progressing toward goals;Goals updated    Frequency    Min 3X/week      PT Plan      Co-evaluation              AM-PAC PT 6 Clicks Mobility   Outcome Measure  Help needed turning from your back to your side while in a flat bed without using bedrails?: A Lot Help needed moving from lying on your back  to sitting on the side of a flat bed without using bedrails?: A Lot Help needed moving to and from a bed to a chair (including a wheelchair)?: A Little Help needed standing up from a chair using your arms (e.g., wheelchair or bedside chair)?: Total Help needed to walk in hospital room?: Total Help needed climbing 3-5 steps with a railing? : Total 6 Click Score: 10    End of Session   Activity Tolerance: Patient tolerated treatment well Patient left: in chair;with call bell/phone within reach;with chair alarm set Nurse Communication: Mobility status PT Visit Diagnosis: Muscle weakness (generalized) (M62.81);Pain;Other abnormalities of gait and mobility (R26.89) Pain - Right/Left: Right Pain - part of body: Leg     Time: 9082-9045 PT Time Calculation (min) (ACUTE ONLY): 37 min  Charges:    $Therapeutic Activity: 23-37 mins PT General Charges $$ ACUTE PT VISIT: 1 Visit                     [facility] PT Acute Rehabilitation Services Office [phone]    Rodgers ORN [facility] 01/14/2024, 10:44 AM

## 2024-01-14 NOTE — Progress Notes (Addendum)
 Contacted by CSW regarding pt going to snf at d/c. Pt for possible d/c tomorrow per attending. Contacted [facility] and spoke to charge RN to confirm that clinic has iv cefazolin  available and can provide to pt at d/c. Will assist as needed.   Randine Mungo Dialysis Navigator [phone]  Addendum at 3:06 pm: Contacted inpt HD unit to request that pt receive inpt HD tomorrow on 1st shift if possible so pt can d/c to snf in a timely manner if stable for d/c.

## 2024-01-14 NOTE — Progress Notes (Signed)
 Pt refused labs.

## 2024-01-14 NOTE — Progress Notes (Signed)
  Informational Antimicrobial Plans with Dialysis   Indication: MSSA right knee bursitis Regimen: Cefazolin  IV 2g with HD sessions (usually T-TH-Sat schedule) End date: 02/04/2024   This is not an official OPAT note and is intended to be only informational that the patient will receive antimicrobial therapy after dialysis sessions.    Thank you for allowing pharmacy to be a part of this patient's care.  Feliciano Close, PharmD PGY2 Infectious Diseases Pharmacy Resident  01/14/2024 11:23 AM

## 2024-01-14 NOTE — Progress Notes (Signed)
 [facility] Progress Note   Subjective:   Patient seen and examined in room. HD yesterday, 2L off.  MSSA in final culture, can do IV cefazolin  at d/c per ID recs.  Objective Vitals:   01/13/24 2254 01/14/24 0411 01/14/24 0719 01/14/24 1140  BP: (!) 135/46 (!) 137/54  (!) 135/59  Pulse: 68 77 66 72  Resp: (!) 23 19 15 14   Temp: 97.7 F (36.5 C)  (!) 97.3 F (36.3 C)   TempSrc: Oral Oral Oral   SpO2: 97% 94% 100% 99%  Weight:      Height:       Physical Exam General:chronically ill appearing, lethargic male in NAD Heart:RRR, no mrg Lungs:CTAB, nml WOB on RA Abdomen:soft, NTND, + fluid wave with ascites, better Extremities:1+ LE edema, b/l BKA Dialysis Access: LU AVF +b/t   Filed Weights   01/13/24 0321 01/13/24 1412 01/13/24 1810  Weight: 89.3 kg 85.8 kg 83.6 kg    Intake/Output Summary (Last 24 hours) at 01/14/2024 1413 Last data filed at 01/14/2024 1100 Gross per 24 hour  Intake 240 ml  Output 2040 ml  Net -1800 ml    Additional Objective Labs: Basic Metabolic Panel: Recent Labs  Lab 01/08/24 0046 01/08/24 0541 01/10/24 0258  NA 136 134* 137  K 5.7* 4.6 5.1  CL 99 96* 98  CO2 22 21* 22  GLUCOSE 76 101* 98  BUN 58* 55* 54*  CREATININE 6.73* 6.47* 6.90*  CALCIUM  7.3* 6.6* 7.7*  PHOS  --  5.2* 6.0*   Liver Function Tests: Recent Labs  Lab 01/08/24 0046 01/08/24 0541 01/10/24 0258  AST 22  --   --   ALT 12  --   --   ALKPHOS 165*  --   --   BILITOT 1.0  --   --   PROT 4.9*  --   --   ALBUMIN  2.3* 1.7* 2.0*   CBC: Recent Labs  Lab 01/08/24 0046 01/08/24 0541 01/10/24 0258  WBC 11.6* 15.1* 11.8*  NEUTROABS 10.6* 13.5* 10.1*  HGB 12.0* 10.5* 10.6*  HCT 38.0* 34.2* 33.3*  MCV 88.8 91.4 87.6  PLT 148* 141* 127*   Blood Culture    Component Value Date/Time   SDES KNEE 01/11/2024 2130   SPECREQUEST NONE 01/11/2024 2130   CULT ABUNDANT STAPHYLOCOCCUS AUREUS 01/11/2024 2130   REPTSTATUS 01/14/2024 FINAL 01/11/2024 2130     Medications:  [START ON 01/15/2024]  ceFAZolin  (ANCEF ) IV      allopurinol   200 mg Oral Daily   Chlorhexidine  Gluconate Cloth  6 each Topical Q0600   Chlorhexidine  Gluconate Cloth  6 each Topical Q0600   heparin  injection (subcutaneous)  5,000 Units Subcutaneous Q8H   levothyroxine   112 mcg Oral Q0600   lidocaine   1 patch Transdermal Q24H   modafinil   200 mg Oral Daily   pantoprazole   40 mg Oral Daily   rosuvastatin   10 mg Oral Daily   sevelamer  carbonate  800 mg Oral TID WC    Dialysis Orders: TTS [facility] 3.75hrs, 400/AF 1.5, 84.6kg 2K, 2.5Ca LU AVF No heparin  Calcitriol  1mcg qHD Venofer 50mg  qwk     Assessment/ Plan: R knee septic prepatellar bursitis: ID following, on vanc/ cefepime , flagyl  discontinued.   AMS - improved Hypotension: soft bp's, got IVF x 1 L, midodrine started by PMD for BP support.  ESRD: on HD TTS. Has not missed HD. Next HD on 01/15/24, first shift for d/c  Volume: CXR was clear, close to dry wt, mild vol  excess. Lower vol as tol w/ HD.  Anemia of esrd: Hb 10-12 here, no esa needs. Will follow.  Secondary hyperparathyroidism - CCa in goal.  Phos elevated. Continue home meds.  Recurrent ascites: possibly d/t heart failure. Prior w/u did not find liver disease. Gets LVP regularly in OP setting.  S/p 3.6L LVP 01/12/2024 Nutrition - renal diet w/fluid restrictions. Dispo: to [facility]   Almarie Bonine MD [facility] 01/14/2024,2:13 PM  LOS: 6 days

## 2024-01-14 NOTE — TOC Progression Note (Addendum)
 Transition of Care [facility]) - Progression Note    Patient Details  Name: [name] MRN: [id number] Date of Birth: Oct 27, 1950  [facility] of [facility] CM/SW Contact  Luise JAYSON Pan, [place] Phone Number: 01/14/2024, 9:47 AM  Clinical Narrative:   Per MD, patient agreeable to rehab at this time. CSW re-involved rehab coordinators for CIR. CSW to await CIR determination.   9:50AM CSW spoke with patient about CIR and informed him that CIR is unable to accept him at this time.   10:31 AM CSW spoke with patients wife, Candis, about patient going to SNF. Candis stated she would like her husband to go to [facility]. CSW to send referral.   12:07 PM [facility] SNF can accept patient today. CSW notified family, MD, and dialysis navigator. CSW inquired with facility about accommodating 7am chair time at patients facility. Shona, admissions director at [facility], stated facility can accommodate that chair time.   Per MD, plan for discharge tomorrow.   CSW will continue to follow.     Expected Discharge Plan: IP Rehab Facility Barriers to Discharge: Continued Medical Work up               Expected Discharge Plan and Services In-house Referral: NA Discharge Planning Services: CM Consult Post Acute Care Choice: NA Living arrangements for the past 2 months: Single Family Home                 DME Arranged: N/A DME Agency: NA       HH Arranged: NA           Social Drivers of Health (SDOH) Interventions SDOH Screenings   Food Insecurity: No Food Insecurity (01/08/2024)  Housing: Low Risk  (01/08/2024)  Transportation Needs: No Transportation Needs (01/08/2024)  Utilities: Not At Risk (01/08/2024)  Depression (PHQ2-9): Low Risk  (12/22/2023)  Recent Concern: Depression (PHQ2-9) - Medium Risk (12/19/2023)  Financial Resource Strain: Low Risk  (09/15/2023)  Physical Activity: Inactive (09/15/2023)  Social Connections: Moderately Isolated (01/08/2024)  Stress: No Stress Concern  Present (09/15/2023)  Tobacco Use: Medium Risk (01/08/2024)  Health Literacy: Adequate Health Literacy (09/15/2023)    Readmission Risk Interventions    01/09/2024    2:52 PM 10/29/2023    3:12 PM 07/29/2022   10:39 AM  Readmission Risk Prevention Plan  Transportation Screening Complete Complete Complete  PCP or Specialist Appt within 3-5 Days  Complete   HRI or Home Care Consult Complete Complete   Palliative Care Screening Not Applicable Not Applicable   Medication Review (RN Care Manager) Complete Complete   PCP or Specialist appointment within 3-5 days of discharge   Complete  SW Recovery Care/Counseling Consult   Complete  Skilled Nursing Facility   Not Applicable

## 2024-01-14 NOTE — Progress Notes (Addendum)
  Inpatient Rehab Admissions Coordinator :  Per therapy recommendations patient was screened for CIR candidacy by Heron Leavell RN MSN. Patient is not at a level to tolerate the intensity required to pursue a CIR admit nor does he look like he has medical neccesity for CIR level rehab at this time. Other rehab venues should be pursued. TOC is aware of recommendations. Please contact me with any questions.  Heron Leavell RN MSN Admissions Coordinator [phone]

## 2024-01-14 NOTE — Plan of Care (Signed)
  Problem: Fluid Volume: Goal: Hemodynamic stability will improve Outcome: Progressing   Problem: Respiratory: Goal: Ability to maintain adequate ventilation will improve Outcome: Progressing   Problem: Clinical Measurements: Goal: Diagnostic test results will improve Outcome: Progressing Goal: Respiratory complications will improve Outcome: Progressing Goal: Cardiovascular complication will be avoided Outcome: Progressing

## 2024-01-15 LAB — MAGNESIUM: Magnesium: 2 mg/dL (ref 1.7–2.4)

## 2024-01-15 MED ORDER — IBUPROFEN 400 MG PO TABS
400.0000 mg | ORAL_TABLET | Freq: Once | ORAL | Status: AC
  Administered 2024-01-15: 400 mg via ORAL
  Filled 2024-01-15: qty 1

## 2024-01-15 MED ORDER — PREGABALIN 75 MG PO CAPS: 75.0000 mg | ORAL_CAPSULE | Freq: Every day | ORAL | 0 refills | Status: DC

## 2024-01-15 MED ORDER — CEFAZOLIN IV (FOR PTA / DISCHARGE USE ONLY): 2.0000 g | INTRAVENOUS | Status: AC

## 2024-01-15 MED ORDER — PREGABALIN 75 MG PO CAPS: 75.0000 mg | ORAL_CAPSULE | Freq: Every day | ORAL | Status: DC

## 2024-01-15 NOTE — Care Management Important Message (Signed)
 Important Message  Patient Details  Name: [name] MRN: [id number] Date of Birth: [birth year]   Important Message Given:  Yes - Medicare IM     [name] 01/15/2024, 12:43 PM

## 2024-01-15 NOTE — Progress Notes (Signed)
 PT Cancellation Note  Patient Details Name: [name] MRN: [id number] DOB: 15-Dec-1950   Cancelled Treatment:    Reason Eval/Treat Not Completed: Patient at procedure or test/unavailable (HD)   [name] 01/15/2024, 8:39 AM [name], PT Acute Rehabilitation Services Office: [phone]

## 2024-01-15 NOTE — Discharge Summary (Addendum)
 Physician Discharge Summary   Patient: [name] MRN: [id number] DOB: 17-Jun-1950  Admit date:     01/08/2024  Discharge date: 01/15/24  Discharge Physician: Deliliah Room   PCP: [name], NP   Recommendations at discharge:   F/u with your PCP in one week. F/u with infectious disease on the scheduled appointment. F/u with orthopedics in 2 weeks. Call to make an appointment. Hemodialysis as per schedule.  Discharge Diagnoses: Principal Problem:   SIRS (systemic inflammatory response syndrome) (HCC) Active Problems:   Permanent atrial fibrillation (HCC)   Encephalopathy   Hypotension   Ascites   CAD (coronary artery disease)   ESRD on dialysis (HCC)   Right knee pain   Insulin  dependent type 2 diabetes mellitus (HCC)   Anemia of chronic disease   Thrombocytopenia   Essential hypertension   Pressure injury of skin   Gastroesophageal reflux disease without esophagitis   (HFpEF) heart failure with preserved ejection fraction [facility])   Hospital Course:  72 year old male with multiple medical and cardiac history. Patient carries diagnosis of gout, hypertension, diabetes mellitus, prior history of CABG and ESRD on hemodialysis TTS. Patient is s/p bilateral BKA. Patient was admitted with SIRS, encephalopathy, right knee pain with associated redness of the skin. Patient underwent arthroscopy of the right knee at the emergency room and analysis was negative for crystals. Cultures have remained negative to date. Patient was started on IV vancomycin , Flagyl  and cefepime . Uric acid was 4.2. ID consulted. S/p 2 cc of purulent fluid aspiration from prepatellar septic bursa on 10/5 Antibiotics changed to IV ancef  to be given on dialysis days with end date of 10/29 as per ID.  Synovial fluid from 10/5 grew MSSA. He was advised to f/u with ID as an outpatient on the scheduled appointment along with orthopedics.  He was dialyzed on the day of the discharge. Discharged to [facility].  Of note, Patient did undergo image guided paracentesis by IR on 10/6 and 3.6 liters of straw colored fluid was removed.       Consultants: ID, Ortho, nephrology, IR, palliative Procedures performed: S/p 2 cc of purulent fluid aspiration from prepatellar septic bursa on 10/5, IR guided paracentesis on 10/6 Disposition: Skilled nursing facility Diet recommendation:  Renal diet DISCHARGE MEDICATION: Allergies as of 01/15/2024       Reactions   Morphine  Other (See Comments)   Other reaction(s): Delusions (intolerance) Other Reaction(s): delusions   Mushroom Extract Complex (obsolete) Nausea Only        Medication List     STOP taking these medications    ibuprofen  200 MG tablet Commonly known as: ADVIL    propranolol  40 MG tablet Commonly known as: INDERAL    spironolactone 25 MG tablet Commonly known as: ALDACTONE   torsemide  20 MG tablet Commonly known as: DEMADEX        TAKE these medications    allopurinol  100 MG tablet Commonly known as: ZYLOPRIM  Take 2 tablets (200 mg total) by mouth daily.   ceFAZolin  IVPB Commonly known as: ANCEF  Inject 2 g into the vein Every Tuesday,Thursday,and Saturday with dialysis for 20 days. Indication:  MSSA right knee bursitis Last Day of Therapy:  02/04/2024 Labs - Once weekly:  CBC/D and BMP, Labs - Once weekly: ESR and CRP Method of administration: Per HD to be given at HD center   modafinil  200 MG tablet Commonly known as: PROVIGIL  Take 1 tablet (200 mg total) by mouth daily.   pantoprazole  40 MG tablet Commonly known as: PROTONIX   Take 1 tablet (40 mg total) by mouth daily.   pregabalin  75 MG capsule Commonly known as: LYRICA  Take 1 capsule (75 mg total) by mouth daily. What changed:  medication strength how much to take when to take this   rosuvastatin  10 MG tablet Commonly known as: CRESTOR  Take 1 tablet (10 mg total) by mouth daily.   sevelamer  carbonate 800 MG tablet Commonly known as:  RENVELA  Take 800 mg by mouth 3 (three) times daily.   Synthroid  112 MCG tablet Generic drug: levothyroxine  Take 112 mcg by mouth daily before breakfast.               Home Infusion Instuctions  (From admission, onward)           Start     Ordered   01/15/24 0000  Home infusion instructions       Question:  Instructions  Answer:  Flushing of vascular access device: 0.9% NaCl pre/post medication administration and prn patency; Heparin  100 u/ml, 5ml for implanted ports and Heparin  10u/ml, 5ml for all other central venous catheters.   01/15/24 1123            Follow-up Information     [name], NP Follow up.   Specialty: Family Medicine Contact information: [address] [place] [address] [phone]         [facility] - A Dept Of . [facility] Follow up on 02/02/2024.   Specialty: Infectious Diseases Why: Hospital Discharge Follow Up with Dr. Jinny Stank -02/02/24 @ 1:45 pm Contact information: [address] [place], [address] [place]  [address] [phone]        Germaine Redbird, MD. Schedule an appointment as soon as possible for a visit in 2 week(s).   Specialty: Sports Medicine Contact information: [address] [place] [address] [phone]                Discharge Exam: Filed Weights   01/13/24 1412 01/13/24 1810 01/15/24 0500  Weight: 85.8 kg 83.6 kg 84.5 kg   General exam: Appears calm and comfortable  Respiratory system: Clear to auscultation. Respiratory effort normal. Cardiovascular system: S1 & S2 heard, RRR. No JVD, murmurs, rubs, gallops or clicks. No pedal edema. Gastrointestinal system: Abdomen is nondistended, soft and nontender. No organomegaly or masses felt. Normal bowel sounds heard. Central nervous system: Alert and oriented. No focal neurological deficits. Extremities: S/p b/l BKA Skin: right knee is slightly swollen and erythematous  but non-tender Psychiatry: Judgement and insight appear normal. Mood & affect appropriate.  Condition at discharge: good  The results of significant diagnostics from this hospitalization (including imaging, microbiology, ancillary and laboratory) are listed below for reference.   Imaging Studies: IR Paracentesis Result Date: 01/12/2024 INDICATION: History of end-stage renal disease with recurrent ascites. Request for therapeutic paracentesis. EXAM: ULTRASOUND GUIDED THERAPEUTIC PARACENTESIS MEDICATIONS: 8 mL 1% lidocaine . COMPLICATIONS: None immediate. PROCEDURE: Informed written consent was obtained from the patient after a discussion of the risks, benefits and alternatives to treatment. A timeout was performed prior to the initiation of the procedure. Initial ultrasound scanning demonstrates a large amount of ascites within the left lower abdominal quadrant. The left lower abdomen was prepped and draped in the usual sterile fashion. 1% lidocaine  was used for local anesthesia. Following this, a 19 gauge, 7-cm, Yueh catheter was introduced. An ultrasound image was saved for documentation purposes. The paracentesis was performed. The catheter was removed and a dressing was  applied. The patient tolerated the procedure well without immediate post procedural complication. FINDINGS: A total of approximately 3.6 liters of straw colored fluid was removed. IMPRESSION: Successful ultrasound-guided paracentesis yielding 3.6 liters of peritoneal fluid. Performed by: Wyatt Pommier, PA-C Electronically Signed   By: CHRISTELLA.  Shick M.D.   On: 01/12/2024 15:33   ECHOCARDIOGRAM COMPLETE Result Date: 01/08/2024    ECHOCARDIOGRAM REPORT   Patient Name:   [name] Date of Exam: 01/08/2024 Medical Rec #:  [id number]    Height:       71.0 in Accession #:    [id number]   Weight:       210.0 lb Date of Birth:  [birth year]     BSA:          2.153 m Patient Age:    73 years     BP:           105/54 mmHg Patient Gender: M             HR:           76 bpm. Exam Location:  Inpatient Procedure: 2D Echo (Both Spectral and Color Flow Doppler were utilized during            procedure). Indications:    CHF  History:        Patient has no prior history of Echocardiogram examinations.                 CHF.  Sonographer:    Norleen Amour Referring Phys: [phone] STEVEN J NEWTON IMPRESSIONS  1. Left ventricular ejection fraction, by estimation, is 55 to 60%. Left ventricular ejection fraction by 2D MOD biplane is 59.3 %. The left ventricle has normal function. The left ventricle has no regional wall motion abnormalities. Left ventricular diastolic function could not be evaluated. There is the interventricular septum is flattened in systole and diastole, consistent with right ventricular pressure and volume overload.  2. Right ventricular systolic function is severely reduced. The right ventricular size is normal. There is moderately elevated pulmonary artery systolic pressure. The estimated right ventricular systolic pressure is 53.4 mmHg.  3. Left atrial size was mildly dilated.  4. The mitral valve is grossly normal. Trivial mitral valve regurgitation.  5. The aortic valve has an indeterminant number of cusps. Aortic valve regurgitation is not visualized. Aortic valve sclerosis/calcification is present, without any evidence of aortic stenosis.  6. The inferior vena cava is dilated in size with <50% respiratory variability, suggesting right atrial pressure of 15 mmHg. Comparison(s): No prior Echocardiogram. FINDINGS  Left Ventricle: Left ventricular ejection fraction, by estimation, is 55 to 60%. Left ventricular ejection fraction by 2D MOD biplane is 59.3 %. The left ventricle has normal function. The left ventricle has no regional wall motion abnormalities. The left ventricular internal cavity size was normal in size. There is no left ventricular hypertrophy. The interventricular septum is flattened in systole and diastole, consistent with right ventricular  pressure and volume overload. Left ventricular diastolic function could not be evaluated due to atrial fibrillation. Left ventricular diastolic function could not be evaluated. Right Ventricle: The right ventricular size is normal. No increase in right ventricular wall thickness. Right ventricular systolic function is severely reduced. There is moderately elevated pulmonary artery systolic pressure. The tricuspid regurgitant velocity is 3.10 m/s, and with an assumed right atrial pressure of 15 mmHg, the estimated right ventricular systolic pressure is 53.4 mmHg. Left Atrium: Left atrial size was mildly dilated. Right Atrium: Right atrial size  was normal in size. Pericardium: There is no evidence of pericardial effusion. Mitral Valve: The mitral valve is grossly normal. Trivial mitral valve regurgitation. Tricuspid Valve: The tricuspid valve is grossly normal. Tricuspid valve regurgitation is trivial. Aortic Valve: The aortic valve has an indeterminant number of cusps. Aortic valve regurgitation is not visualized. Aortic valve sclerosis/calcification is present, without any evidence of aortic stenosis. Aortic valve mean gradient measures 5.0 mmHg. Aortic valve peak gradient measures 10.2 mmHg. Aortic valve area, by VTI measures 1.56 cm. Pulmonic Valve: The pulmonic valve was grossly normal. Pulmonic valve regurgitation is trivial. Aorta: The aortic root and ascending aorta are structurally normal, with no evidence of dilitation. Venous: The inferior vena cava is dilated in size with less than 50% respiratory variability, suggesting right atrial pressure of 15 mmHg. IAS/Shunts: The interatrial septum is aneurysmal. No atrial level shunt detected by color flow Doppler.  LEFT VENTRICLE PLAX 2D                        Biplane EF (MOD) LVIDd:         4.10 cm         LV Biplane EF:   Left LVIDs:         2.60 cm                          ventricular LV PW:         1.10 cm                          ejection LV IVS:        0.90  cm                          fraction by LVOT diam:     2.00 cm                          2D MOD LV SV:         54                               biplane is LV SV Index:   25                               59.3 %. LVOT Area:     3.14 cm                                Diastology                                LV e' medial:    9.14 cm/s LV Volumes (MOD)               LV E/e' medial:  16.7 LV vol d, MOD    98.1 ml       LV e' lateral:   14.00 cm/s A2C:                           LV E/e' lateral: 10.9 LV vol d, MOD    84.5  ml A4C: LV vol s, MOD    46.1 ml A2C: LV vol s, MOD    27.5 ml A4C: LV SV MOD A2C:   52.0 ml LV SV MOD A4C:   84.5 ml LV SV MOD BP:    55.0 ml RIGHT VENTRICLE            IVC RV Basal diam:  3.60 cm    IVC diam: 2.00 cm RV S prime:     3.49 cm/s TAPSE (M-mode): 0.8 cm LEFT ATRIUM             Index        RIGHT ATRIUM           Index LA diam:        4.90 cm 2.28 cm/m   RA Area:     14.00 cm LA Vol (A2C):   77.7 ml 36.09 ml/m  RA Volume:   33.30 ml  15.47 ml/m LA Vol (A4C):   73.2 ml 34.00 ml/m LA Biplane Vol: 77.8 ml 36.14 ml/m  AORTIC VALVE                     PULMONIC VALVE AV Area (Vmax):    1.47 cm      PV Vmax:       1.08 m/s AV Area (Vmean):   1.45 cm      PV Peak grad:  4.7 mmHg AV Area (VTI):     1.56 cm AV Vmax:           160.00 cm/s AV Vmean:          105.000 cm/s AV VTI:            0.348 m AV Peak Grad:      10.2 mmHg AV Mean Grad:      5.0 mmHg LVOT Vmax:         75.00 cm/s LVOT Vmean:        48.300 cm/s LVOT VTI:          0.173 m LVOT/AV VTI ratio: 0.50  AORTA Ao Root diam: 3.20 cm Ao Asc diam:  3.10 cm MITRAL VALVE                TRICUSPID VALVE MV Area (PHT): 5.02 cm     TR Peak grad:   38.4 mmHg MV Decel Time: 151 msec     TR Vmax:        310.00 cm/s MV E velocity: 153.00 cm/s MV A velocity: 57.70 cm/s   SHUNTS MV E/A ratio:  2.65         Systemic VTI:  0.17 m                             Systemic Diam: 2.00 cm Vinie Maxcy MD Electronically signed by Vinie Maxcy MD Signature  Date/Time: 01/08/2024/2:51:37 PM    Final    CT CHEST ABDOMEN PELVIS WO CONTRAST Result Date: 01/08/2024 CLINICAL DATA:  Sepsis. EXAM: CT CHEST, ABDOMEN AND PELVIS WITHOUT CONTRAST TECHNIQUE: Multidetector CT imaging of the chest, abdomen and pelvis was performed following the standard protocol without IV contrast. RADIATION DOSE REDUCTION: This exam was performed according to the departmental dose-optimization program which includes automated exposure control, adjustment of the mA and/or kV according to patient size and/or use of iterative reconstruction technique. COMPARISON:  05/04/2023 FINDINGS: CT CHEST FINDINGS Cardiovascular: The heart is enlarged. Coronary artery calcification is evident.  Moderate atherosclerotic calcification is noted in the wall of the thoracic aorta. Enlargement of the pulmonary outflow tract/main pulmonary arteries suggests pulmonary arterial hypertension. Mediastinum/Nodes: No mediastinal lymphadenopathy. No evidence for gross hilar lymphadenopathy although assessment is limited by the lack of intravenous contrast on the current study. The esophagus has normal imaging features. There is no axillary lymphadenopathy. Lungs/Pleura: Fine architectural detail of lung parenchyma obscured by breathing motion. Within this limitation there is no pneumothorax or substantial pleural effusion. No dense focal consolidative airspace disease. Tiny calcified granuloma noted left lower lobe. Subsegmental atelectasis identified in the lingula and both lower lobes. The patchy nodular airspace disease seen previously in the lingula and both lower lobes appears to have resolved although again the motion artifact hinders assessment. Musculoskeletal: No worrisome lytic or sclerotic osseous abnormality. CT ABDOMEN PELVIS FINDINGS Hepatobiliary: No suspicious focal abnormality in the liver on this study without intravenous contrast. Nodularity of liver contour consistent with cirrhosis. Cholecystectomy. No  intrahepatic or extrahepatic biliary dilation. Pancreas: No focal mass lesion. No dilatation of the main duct. No intraparenchymal cyst. No peripancreatic edema. Spleen: No splenomegaly. No suspicious focal mass lesion. Adrenals/Urinary Tract: 12 mm left adrenal nodule is likely an adenoma. 3.9 cm right adrenal mass is mildly heterogeneous with some areas demonstrating low attenuation suggesting adenoma this lesion has been present since 2016 when it measured 3 cm and demonstrated more substantial macroscopic fat density, features suggesting benign etiology. 3.9 x 3.2 cm relatively low-density lesion upper pole right kidney is similar to prior and potentially a cyst given the relative stability over multiple studies. Both kidneys are atrophic. No evidence for hydroureteronephrosis. Bladder is decompressed. Stomach/Bowel: Stomach is unremarkable. No gastric wall thickening. No evidence of outlet obstruction. Duodenum is normally positioned as is the ligament of Treitz. Hemostatic clip(s) identified in the duodenal lumen. No small bowel wall thickening. No small bowel dilatation. The terminal ileum is normal. The appendix is normal. No gross colonic mass. No colonic wall thickening. Diverticular changes are noted in the left colon without evidence of diverticulitis. Vascular/Lymphatic: There is advanced atherosclerotic calcification of the abdominal aorta without aneurysm. There is no gastrohepatic or hepatoduodenal ligament lymphadenopathy. Mild retroperitoneal lymphadenopathy appears progressive in the interval. 14 mm short axis left para-aortic node identified on 83/3. 11 mm short axis retrocaval node on 79/3 (previously about 7 mm short axis). 11 mm short axis right common iliac node on 93/3 was 7 mm previously. 11 mm short axis right external iliac node identified on 01/16/3. 13 mm short axis right groin node on 136/3 was 8 mm previously. Reproductive: Fiducial markers are identified in the prostate gland.Fluid  in the scrotum suggests hydrocele with some probable edema in the soft tissues of the penis. Other: Moderate to large volume ascites is similar to prior. Diffuse body wall edema evident. Musculoskeletal: Right groin hernia contains free fluid. No worrisome lytic or sclerotic osseous abnormality. Advanced degenerative disc disease noted lumbar spine IMPRESSION: 1. No acute findings in the chest. The patchy nodular airspace disease seen previously in the lingula and both lower lobes appears to have resolved although again the motion artifact hinders assessment for complete resolution. 2. Enlargement of the pulmonary outflow tract/main pulmonary arteries suggests pulmonary arterial hypertension. 3. Cirrhosis with moderate to large volume ascites, similar to prior. 4. Mild retroperitoneal and right pelvic sidewall lymphadenopathy appears progressive in the interval. This is nonspecific and may be reactive. Close follow-up recommended. 5. Fluid in the scrotum suggests hydrocele with some probable edema in the soft tissues  of the penis. 6. Right groin hernia contains free fluid. 7.  Aortic Atherosclerosis (ICD10-I70.0). Electronically Signed   By: Camellia Candle M.D.   On: 01/08/2024 05:48   DG Chest Port 1 View if patient is in a treatment room. Result Date: 01/08/2024 EXAM: 1 VIEW(S) XRAY OF THE CHEST 01/08/2024 12:57:20 AM COMPARISON: Comparison with 10/31/2023. CLINICAL HISTORY: Suspected Sepsis. FINDINGS: LUNGS AND PLEURA: Low lung volumes accentuate pulmonary vascularity. No focal pulmonary opacity. No pulmonary edema. No pleural effusion. No pneumothorax. HEART AND MEDIASTINUM: Low lung volumes accentuate cardiomediastinal silhouette. Sternotomy and CABG. No acute abnormality of the cardiac and mediastinal silhouettes. BONES AND SOFT TISSUES: Sternotomy. No acute osseous abnormality. IMPRESSION: 1. No acute findings. Electronically signed by: [name] Gatlin MD 01/08/2024 01:03 AM EDT RP Workstation: [id number]    IR Paracentesis Result Date: 12/16/2023 INDICATION: Patient with a history of end-stage renal disease with recurrent ascites. Interventional Radiology asked to perform a therapeutic paracentesis. EXAM: ULTRASOUND GUIDED PARACENTESIS MEDICATIONS: 1% lidocaine  10 mL COMPLICATIONS: None immediate. PROCEDURE: Informed written consent was obtained from the patient after a discussion of the risks, benefits and alternatives to treatment. A timeout was performed prior to the initiation of the procedure. Initial ultrasound scanning demonstrates a large amount of ascites within the left lower abdominal quadrant. The left lower abdomen was prepped and draped in the usual sterile fashion. 1% lidocaine  was used for local anesthesia. Following this, a 19 gauge, 7-cm, Yueh catheter was introduced. An ultrasound image was saved for documentation purposes. The paracentesis was performed. The catheter was removed and a dressing was applied. The patient tolerated the procedure well without immediate post procedural complication. FINDINGS: A total of approximately 7.1 L of clear yellow fluid was removed. IMPRESSION: Successful ultrasound-guided paracentesis yielding 7.1 liters of peritoneal fluid. Procedure performed by: Warren Dais, NP Electronically Signed   By: Juliene Balder M.D.   On: 12/16/2023 15:17    Microbiology: Results for orders placed or performed during the hospital encounter of 01/08/24  Culture, blood (Routine x 2)     Status: None   Collection Time: 01/08/24 12:46 AM   Specimen: BLOOD RIGHT ARM  Result Value Ref Range Status   Specimen Description BLOOD RIGHT ARM  Final   Special Requests   Final    BOTTLES DRAWN AEROBIC AND ANAEROBIC Blood Culture adequate volume   Culture   Final    NO GROWTH 5 DAYS Performed at [facility], [address], [place], [place] [address]    Report Status 01/13/2024 FINAL  Final  Culture, blood (Routine x 2)     Status: None   Collection Time: 01/08/24 12:46  AM   Specimen: BLOOD RIGHT ARM  Result Value Ref Range Status   Specimen Description BLOOD RIGHT ARM  Final   Special Requests   Final    BOTTLES DRAWN AEROBIC AND ANAEROBIC Blood Culture results may not be optimal due to an inadequate volume of blood received in culture bottles   Culture   Final    NO GROWTH 5 DAYS Performed at [facility], [address], [place], [place] [address]    Report Status 01/13/2024 FINAL  Final  Body fluid culture w Gram Stain     Status: None   Collection Time: 01/08/24  1:51 AM   Specimen: KNEE; Body Fluid  Result Value Ref Range Status   Specimen Description KNEE  Final   Special Requests NONE  Final   Gram Stain   Final  RARE WBC PRESENT, PREDOMINANTLY PMN NO ORGANISMS SEEN    Culture   Final    NO GROWTH 3 DAYS Performed at [facility], [address] [place], [place] [address]    Report Status 01/11/2024 FINAL  Final  Resp panel by RT-PCR (RSV, Flu A&B, Covid) KNEE     Status: None   Collection Time: 01/08/24  1:51 AM   Specimen: KNEE; Nasal Swab  Result Value Ref Range Status   SARS Coronavirus 2 by RT PCR NEGATIVE NEGATIVE Final   Influenza A by PCR NEGATIVE NEGATIVE Final   Influenza B by PCR NEGATIVE NEGATIVE Final    Comment: (NOTE) The Xpert Xpress SARS-CoV-2/FLU/RSV plus assay is intended as an aid in the diagnosis of influenza from Nasopharyngeal swab specimens and should not be used as a sole basis for treatment. Nasal washings and aspirates are unacceptable for Xpert Xpress SARS-CoV-2/FLU/RSV testing.  Fact Sheet for Patients: BloggerCourse.com  Fact Sheet for Healthcare Providers: SeriousBroker.it  This test is not yet approved or cleared by the [place]  FDA and has been authorized for detection and/or diagnosis of SARS-CoV-2 by FDA under an Emergency Use Authorization (EUA). This EUA will remain in effect (meaning this test can be used) for the  duration of the COVID-19 declaration under Section 564(b)(1) of the Act, 21 U.S.C. section 360bbb-3(b)(1), unless the authorization is terminated or revoked.     Resp Syncytial Virus by PCR NEGATIVE NEGATIVE Final    Comment: (NOTE) Fact Sheet for Patients: BloggerCourse.com  Fact Sheet for Healthcare Providers: SeriousBroker.it  This test is not yet approved or cleared by the [place]  FDA and has been authorized for detection and/or diagnosis of SARS-CoV-2 by FDA under an Emergency Use Authorization (EUA). This EUA will remain in effect (meaning this test can be used) for the duration of the COVID-19 declaration under Section 564(b)(1) of the Act, 21 U.S.C. section 360bbb-3(b)(1), unless the authorization is terminated or revoked.  Performed at [facility], [address], [place], [place] [address]   Body fluid culture w Gram Stain     Status: None   Collection Time: 01/11/24  9:30 PM   Specimen: KNEE; Body Fluid  Result Value Ref Range Status   Specimen Description KNEE  Final   Special Requests NONE  Final   Gram Stain   Final    FEW WBC PRESENT, PREDOMINANTLY PMN MODERATE GRAM POSITIVE COCCI Performed at [facility], [address], [place], [place] [address]    Culture ABUNDANT STAPHYLOCOCCUS AUREUS  Final   Report Status 01/14/2024 FINAL  Final   Organism ID, Bacteria STAPHYLOCOCCUS AUREUS  Final      Susceptibility   Staphylococcus aureus - MIC*    CIPROFLOXACIN  <=0.5 SENSITIVE Sensitive     ERYTHROMYCIN <=0.25 SENSITIVE Sensitive     GENTAMICIN <=0.5 SENSITIVE Sensitive     OXACILLIN 0.5 SENSITIVE Sensitive     TETRACYCLINE <=1 SENSITIVE Sensitive     VANCOMYCIN  1 SENSITIVE Sensitive     TRIMETH /SULFA  <=10 SENSITIVE Sensitive     CLINDAMYCIN <=0.25 SENSITIVE Sensitive     RIFAMPIN <=0.5 SENSITIVE Sensitive     Inducible Clindamycin NEGATIVE Sensitive     LINEZOLID 2 SENSITIVE Sensitive      * ABUNDANT STAPHYLOCOCCUS AUREUS   *Note: Due to a large number of results and/or encounters for the requested time period, some results have not been displayed. A complete set of results can be found in Results Review.  Labs: CBC: Recent Labs  Lab 01/10/24 0258  WBC 11.8*  NEUTROABS 10.1*  HGB 10.6*  HCT 33.3*  MCV 87.6  PLT 127*   Basic Metabolic Panel: Recent Labs  Lab 01/10/24 0258 01/11/24 0256 01/12/24 0311 01/13/24 0500 01/15/24 0216  NA 137  --   --   --   --   K 5.1  --   --   --   --   CL 98  --   --   --   --   CO2 22  --   --   --   --   GLUCOSE 98  --   --   --   --   BUN 54*  --   --   --   --   CREATININE 6.90*  --   --   --   --   CALCIUM  7.7*  --   --   --   --   MG  --  2.1 2.2 2.0 2.0  PHOS 6.0*  --   --   --   --    Liver Function Tests: Recent Labs  Lab 01/10/24 0258  ALBUMIN  2.0*   CBG: Recent Labs  Lab 01/12/24 1104 01/12/24 1602 01/12/24 2150 01/13/24 0624 01/13/24 1055  GLUCAP 89 70 125* 101* 138*    Discharge time spent: 45 minutes.  Signed: Deliliah Room, MD [facility] 01/15/2024

## 2024-01-15 NOTE — Progress Notes (Signed)
  Received patient in bed to unit.   Informed consent signed and in chart.    TX duration:3.5     Transported by  Hand-off given to patient's nurse.    Access used: Left AVF Access issues: None   Total UF removed: 2000 Medication(s) given: Zoysan Post HD VS: 16/45 :      Hunter Hacking LPN [facility]

## 2024-01-15 NOTE — Progress Notes (Signed)
 Patient ID: [name], male   DOB: [birth year], 73 y.o.   MRN: [id number]   LOS: 7 days   Subjective: Doing better since last aspiration but knee still quite red and still painful with prosthetic use.   Objective: Vital signs in last 24 hours: Temp:  [97.8 F (36.6 C)-98.2 F (36.8 C)] 97.8 F (36.6 C) (10/09 1149) Pulse Rate:  [40-95] 67 (10/09 1149) Resp:  [14-21] 17 (10/09 1149) BP: (122-159)/(43-103) 136/45 (10/09 1149) SpO2:  [93 %-100 %] 100 % (10/09 1149) Weight:  [84.5 kg-84.8 kg] 84.8 kg (10/09 1149) Last BM Date : 01/14/24    Physical Exam General appearance: alert and no distress Right knee -- Erythematous, boggy over prepatellar bursa. Mild TTP. Painless flex/ext of knee. No wound or discharge.   Assessment/Plan: Right prepatellar septic bursitis -- I would have wanted more improvement at this point but since he has had subjective improvement I see no reason he can't go to rehab and continue abx treatment there. Would f/u with Dr. Harden next week to gauge progress and consider formal I&D vs bursectomy.    Jon DOROTHA Ned, PA-C Orthopedic Surgery [phone] 01/15/2024

## 2024-01-15 NOTE — Plan of Care (Signed)
 [facility] Patient Discharge Orders- [facility] CLINIC: [facility]  Patient's name: [name] Admit/DC Dates: 01/08/2024 - 01/15/2024  Discharge Diagnoses: MSSA R knee septic bursitis. Continues 4 week course of cefazolin  Debility. D/c to [facility] SNF   Outpatient Dialysis Orders:  -Heparin : No change -EDW No change   -[place]ath: No change  Anemia Aranesp : Given: -   Date of last dose/amount: -   PRBC's Given: -- Date/# of units: - ESA dose for discharge:   Recent Labs  Lab 01/10/24 0258  HGB 10.6*  K 5.1  CALCIUM  7.7*  PHOS 6.0*  ALBUMIN  2.0*    Access intervention/Change: none    Medications: -IV Antibiotics: Cefazolin  2g IV q HD until 02/04/24 for septic knee    OTHER/APPTS/LABS    Completed by: Maisie Ronnald Acosta PA-C   D/C Meds to be reconciled by nurse after every discharge.    Reviewed by: MD:______ RN_______

## 2024-01-15 NOTE — Progress Notes (Signed)
 [facility] Progress Note   Subjective:   For d/c today.  He is very excited  Objective Vitals:   01/15/24 0930 01/15/24 1000 01/15/24 1030 01/15/24 1100  BP: (!) 128/49 (!) 129/43 127/65 (!) 122/48  Pulse: (!) 40 70 78 77  Resp: 16 (!) 21 18 16   Temp:      TempSrc:      SpO2: 97% 95% 97% 98%  Weight:      Height:       Physical Exam General:chronically ill appearing,  NAD Heart:RRR, no mrg Lungs:CTAB, nml WOB on RA Abdomen:soft, NTND, + fluid wave with ascites, better Extremities:1+ LE edema, b/l BKA Dialysis Access: LU AVF +b/t   Filed Weights   01/13/24 1412 01/13/24 1810 01/15/24 0500  Weight: 85.8 kg 83.6 kg 84.5 kg    Intake/Output Summary (Last 24 hours) at 01/15/2024 1134 Last data filed at 01/15/2024 0433 Gross per 24 hour  Intake --  Output 20 ml  Net -20 ml    Additional Objective Labs: Basic Metabolic Panel: Recent Labs  Lab 01/10/24 0258  NA 137  K 5.1  CL 98  CO2 22  GLUCOSE 98  BUN 54*  CREATININE 6.90*  CALCIUM  7.7*  PHOS 6.0*   Liver Function Tests: Recent Labs  Lab 01/10/24 0258  ALBUMIN  2.0*   CBC: Recent Labs  Lab 01/10/24 0258  WBC 11.8*  NEUTROABS 10.1*  HGB 10.6*  HCT 33.3*  MCV 87.6  PLT 127*   Blood Culture    Component Value Date/Time   SDES KNEE 01/11/2024 2130   SPECREQUEST NONE 01/11/2024 2130   CULT ABUNDANT STAPHYLOCOCCUS AUREUS 01/11/2024 2130   REPTSTATUS 01/14/2024 FINAL 01/11/2024 2130    Medications:   ceFAZolin  (ANCEF ) IV      allopurinol   200 mg Oral Daily   Chlorhexidine  Gluconate Cloth  6 each Topical Q0600   Chlorhexidine  Gluconate Cloth  6 each Topical Q0600   heparin  injection (subcutaneous)  5,000 Units Subcutaneous Q8H   ibuprofen   400 mg Oral Once   levothyroxine   112 mcg Oral Q0600   lidocaine   1 patch Transdermal Q24H   modafinil   200 mg Oral Daily   pantoprazole   40 mg Oral Daily   rosuvastatin   10 mg Oral Daily   sevelamer  carbonate  800 mg Oral TID WC     Dialysis Orders: TTS [facility] 3.75hrs, 400/AF 1.5, 84.6kg 2K, 2.5Ca LU AVF No heparin  Calcitriol  1mcg qHD Venofer 50mg  qwk     Assessment/ Plan: R knee septic prepatellar bursitis: ID following, on vanc/ cefepime , flagyl  discontinued.   MSSA, to get IV cefazolin  for 4 weeks per ID (came in to hospital 10/2 and received antibiotics from the start, so looks like last dose would be given in dialysis on Saturday 11/1) AMS - improved Hypotension: soft bp's, got IVF x 1 L, midodrine started by PMD for BP support.  ESRD: on HD TTS. Has not missed HD. Next HD on 01/15/24, first shift for d/c  Volume: CXR was clear, close to dry wt, mild vol excess. Lower vol as tol w/ HD.  Anemia of esrd: Hb 10-12 here, no esa needs. Will follow.  Secondary hyperparathyroidism - CCa in goal.  Phos elevated. Continue home meds.  Recurrent ascites: possibly d/t heart failure. Prior w/u did not find liver disease. Gets LVP regularly in OP setting.  S/p 3.6L LVP 01/12/2024 Nutrition - renal diet w/fluid restrictions. Dispo: to [facility]   Almarie Bonine MD [facility] 01/15/2024,11:34 AM  LOS: 7 days

## 2024-01-15 NOTE — Progress Notes (Signed)
 RN to discharge  Med details complete and AVS in chart  Needs:  RN report to  SNF and PTAR

## 2024-01-15 NOTE — Progress Notes (Signed)
 Mobility Specialist Progress Note:   01/15/24 1400  Mobility  Activity Pivoted/transferred to/from BSC  Level of Assistance Moderate assist, patient does 50-74% (+2)  Activity Response Tolerated well  Mobility Referral Yes  Mobility visit 1 Mobility  Mobility Specialist Start Time (ACUTE ONLY) 1400  Mobility Specialist Stop Time (ACUTE ONLY) 1415  Mobility Specialist Time Calculation (min) (ACUTE ONLY) 15 min   Pt requesting to transfer to [facility]. Required modA+2 to stand and pivot d/t not having drop arm BSC. Pt left with all needs met, NT in room.  Therisa Rana Mobility Specialist Please contact via SecureChat or  Rehab office at [phone]

## 2024-01-15 NOTE — Procedures (Signed)
 Patient seen and examined on Hemodialysis. The procedure was supervised and I have made appropriate changes. BP (!) 122/48   Pulse 77   Temp 97.8 F (36.6 C) (Oral)   Resp 16   Ht 5' 11 (1.803 m)   Wt 84.5 kg   SpO2 98%   BMI 25.98 kg/m   QB 400 mL/min via AVF, UF goal 2L  Tolerating treatment without complaints at this time.   Almarie Bonine MD [facility] [phone] 11:40 AM

## 2024-01-15 NOTE — Progress Notes (Signed)
 Called a gave report to [place] at [facility] [phone]. Pt will go to room 3246. PT[facility] has been called.

## 2024-01-15 NOTE — Progress Notes (Signed)
 D/c orders noted, contacted out-pt HD clinic, informed of pt d/c to SNF and to anticipate pt back on Saturday per normal schedule. Reminded clinic of Cefazolin  at this time. No further support needed.   Mikisha Roseland [facility] [phone]

## 2024-01-15 NOTE — Plan of Care (Signed)
  Problem: Clinical Measurements: Goal: Will remain free from infection Outcome: Progressing Goal: Respiratory complications will improve Outcome: Progressing Goal: Cardiovascular complication will be avoided Outcome: Progressing   Problem: Safety: Goal: Ability to remain free from injury will improve Outcome: Progressing   Problem: Tissue Perfusion: Goal: Adequacy of tissue perfusion will improve Outcome: Progressing

## 2024-01-15 NOTE — TOC Transition Note (Signed)
 Transition of Care [facility]) - Discharge Note   Patient Details  Name: [name] MRN: [id number] Date of Birth: [birth year]  Transition of Care [facility] CM/SW Contact:  Luise JAYSON Pan, LCSWA Phone Number: 01/15/2024, 2:40 PM   Clinical Narrative:   Patient will DC to: [facility]  Anticipated DC date: 01/15/24  Family notified: Candis (spouse)  Transport by: [facility]   Per MD patient ready for DC to [facility] SNF. RN to call report prior to discharge Jhonnie [phone]; room 3246). RN, patient, patient's family, and facility notified of DC. Discharge Summary and FL2 sent to facility. DC packet on chart. Ambulance transport requested for patient 2:00PM.   CSW will sign off for now as social work intervention is no longer needed. Please consult us  again if new needs arise.      Final next level of care: Skilled Nursing Facility Barriers to Discharge: Barriers Resolved   Patient Goals and CMS Choice Patient states their goals for this hospitalization and ongoing recovery are:: return home with wife   Choice offered to / list presented to : NA      Discharge Placement PASRR number recieved: 01/14/24            Patient chooses bed at: [facility] Patient to be transferred to facility by: [facility] Name of family member notified: Candis (spouse) Patient and family notified of of transfer: 01/15/24  Discharge Plan and Services Additional resources added to the After Visit Summary for   In-house Referral: NA Discharge Planning Services: CM Consult Post Acute Care Choice: NA          DME Arranged: N/A DME Agency: NA       HH Arranged: NA          Social Drivers of Health (SDOH) Interventions SDOH Screenings   Food Insecurity: No Food Insecurity (01/08/2024)  Housing: Low Risk  (01/08/2024)  Transportation Needs: No Transportation Needs (01/08/2024)  Utilities: Not At Risk (01/08/2024)  Depression (PHQ2-9): Low Risk  (12/22/2023)  Recent Concern: Depression  (PHQ2-9) - Medium Risk (12/19/2023)  Financial Resource Strain: Low Risk  (09/15/2023)  Physical Activity: Inactive (09/15/2023)  Social Connections: Moderately Isolated (01/08/2024)  Stress: No Stress Concern Present (09/15/2023)  Tobacco Use: Medium Risk (01/08/2024)  Health Literacy: Adequate Health Literacy (09/15/2023)     Readmission Risk Interventions    01/09/2024    2:52 PM 10/29/2023    3:12 PM 07/29/2022   10:39 AM  Readmission Risk Prevention Plan  Transportation Screening Complete Complete Complete  PCP or Specialist Appt within 3-5 Days  Complete   HRI or Home Care Consult Complete Complete   Palliative Care Screening Not Applicable Not Applicable   Medication Review (RN Care Manager) Complete Complete   PCP or Specialist appointment within 3-5 days of discharge   Complete  SW Recovery Care/Counseling Consult   Complete  Skilled Nursing Facility   Not Applicable

## 2024-01-16 ENCOUNTER — Telehealth: Payer: Self-pay | Admitting: *Deleted

## 2024-01-16 NOTE — Telephone Encounter (Signed)
 Copied from CRM [id number]. Topic: General - Other >> Jan 16, 2024  8:35 AM Carlyon D wrote: Reason for CRM: Elspeth calling from [facility], diagnosed with sirs  was discharged from hospital with Lyrica  75 mg But Nurse Practitioner Elspeth is stating it was originally 150 mg 3 times a day prescribed recently as 9/12. NP  Elspeth is wondering why the does was dropped so much Np is asking if the dose can go back up to the 150 mg. Mannie call back number this is his direct line. [phone]

## 2024-01-20 NOTE — Telephone Encounter (Signed)
 Talked to steven and steven states they will go back up to 150 mg and keep an eye on his renal function.

## 2024-02-02 ENCOUNTER — Ambulatory Visit: Admitting: Orthopedic Surgery

## 2024-02-02 ENCOUNTER — Ambulatory Visit: Admitting: Internal Medicine

## 2024-02-02 ENCOUNTER — Ambulatory Visit (INDEPENDENT_AMBULATORY_CARE_PROVIDER_SITE_OTHER): Admitting: Orthopedic Surgery

## 2024-02-02 DIAGNOSIS — Z89512 Acquired absence of left leg below knee: Secondary | ICD-10-CM

## 2024-02-02 DIAGNOSIS — M7041 Prepatellar bursitis, right knee: Secondary | ICD-10-CM | POA: Diagnosis not present

## 2024-02-02 DIAGNOSIS — L97821 Non-pressure chronic ulcer of other part of left lower leg limited to breakdown of skin: Secondary | ICD-10-CM

## 2024-02-02 DIAGNOSIS — Z89511 Acquired absence of right leg below knee: Secondary | ICD-10-CM | POA: Diagnosis not present

## 2024-02-02 DIAGNOSIS — S88112A Complete traumatic amputation at level between knee and ankle, left lower leg, initial encounter: Secondary | ICD-10-CM

## 2024-02-02 DIAGNOSIS — S88111A Complete traumatic amputation at level between knee and ankle, right lower leg, initial encounter: Secondary | ICD-10-CM

## 2024-02-03 NOTE — Progress Notes (Signed)
 Office Visit Note   Patient: [name]           Date of Birth: [birth year]           MRN: [id number] Visit Date: 02/02/2024              Requested by: [name], NP [address] [place],  [place] [address] PCP: [name], NP  Chief Complaint  Patient presents with   Right Knee - Pain      HPI: Discussed the use of AI scribe software for clinical note transcription with the patient, who gave verbal consent to proceed.  History of Present Illness [name] is a 73 year old male who presents for follow-up of a leg infection.  He has been hospitalized for a leg infection and is currently receiving intravenous vancomycin  in conjunction with dialysis. The infection was identified in the bursa sac above the knee, leading to prepatellar bursitis. The swelling in the knee has reduced significantly since the onset, when it was described as 'beet red and swollen'. He has been on this treatment for two weeks and has over a week remaining.  He experiences significant swelling in the stump of his leg, which he attributes to sliding down into the socket of his prosthetic leg, causing it to bang against his knee and the end of his leg. The stump has 'hard induration' and he likens its appearance to 'Fred Flintstone's feet'. He is using shrinkers to manage the swelling and has been wearing them 'around the clock'. The shrinkers are worn out and require replacement. He prefers shrinkers made of heavier material for durability.  He has experienced an allergic reaction to antibiotics in the past. He is concerned about the duration of the swelling and is eager to see improvement so he can resume using his prosthetic leg for basic mobility, such as getting to the bathroom. He is currently using a prosthetic leg with an adjustable socket, which he tightens using a ratchet mechanism to prevent sliding. He is considering the need for a new prescription for prosthetic supplies,  including liners, sleeves, and shrinkers, to better manage his condition.     Assessment & Plan: Visit Diagnoses:  1. Skin ulcer of left knee, limited to breakdown of skin (HCC)   2. Below-knee amputation of left lower extremity, initial encounter (HCC)   3. Below-knee amputation of right lower extremity, initial encounter (HCC)   4. Prepatellar bursitis of right knee     Plan: Assessment and Plan Assessment & Plan Prepatellar bursitis with resolving infection of right knee Resolving prepatellar bursitis with previous infection in the bursa sac above the right knee, likely due to trauma from the leg impacting the socket. No cellulitis or drainage present, and infection is subsiding with ongoing IV antibiotic treatment. - Complete course of IV antibiotics. - Ensure leg is not impacting the bottom of the socket to prevent recurrence.  Swelling and induration of right lower extremity amputation stump Swelling and induration of the right lower extremity amputation stump due to subsiding into the socket. Swelling is expected to reduce with the use of shrinkers. He prefers shrinkers made of heavier material for durability. - Prescribe new shrinkers and supplies. - Wear shrinker continuously to reduce swelling. - Follow up in two weeks to assess readiness for prosthetic fitting.      Follow-Up Instructions: No follow-ups on file.   Ortho Exam  Patient is alert, oriented, no adenopathy, well-dressed, normal affect, normal  respiratory effort. Physical Exam MUSCULOSKELETAL: Resolving right prepatellar bursitis. Hard induration of the residual limb. SKIN: No cellulitis. No drainage.  Patient is an existing right transtibial  amputee.  Patient's current comorbidities are not expected to impact the ability to function with the prescribed prosthesis. Patient verbally communicates a strong desire to use a prosthesis. Patient currently requires mobility aids to ambulate without a  prosthesis.  Expects not to use mobility aids with a new prosthesis. Patient is expected to resume or reach their K Level within 6 months. Patient was active before the amputation and independent with stairs, uneven terrain, varying cadence, and a community ambulator.  Patient is a K2 level ambulator that will use a prosthesis to walk around their home and the community over low level environmental barriers.          Imaging: No results found. No images are attached to the encounter.  Labs: Lab Results  Component Value Date   HGBA1C 4.9 10/29/2023   HGBA1C 5.0 05/17/2022   HGBA1C 5.5 05/04/2021   ESRSEDRATE 34 (H) 01/24/2020   ESRSEDRATE 41 (H) 01/10/2020   ESRSEDRATE 55 (H) 12/27/2019   CRP 8.8 (H) 01/24/2020   CRP 35.9 (H) 01/10/2020   CRP 77.9 (H) 12/27/2019   LABURIC 4.2 01/09/2024   REPTSTATUS 01/14/2024 FINAL 01/11/2024   GRAMSTAIN  01/11/2024    FEW WBC PRESENT, PREDOMINANTLY PMN MODERATE GRAM POSITIVE COCCI Performed at [facility], [address], [place], [place] [address]    CULT ABUNDANT STAPHYLOCOCCUS AUREUS 01/11/2024   LABORGA STAPHYLOCOCCUS AUREUS 01/11/2024     Lab Results  Component Value Date   ALBUMIN  2.0 (L) 01/10/2024   ALBUMIN  1.7 (L) 01/08/2024   ALBUMIN  2.3 (L) 01/08/2024   PREALBUMIN 32.7 05/09/2021   PREALBUMIN 14.8 (L) 12/14/2018    Lab Results  Component Value Date   MG 2.0 01/15/2024   MG 2.0 01/13/2024   MG 2.2 01/12/2024   Lab Results  Component Value Date   VD25OH 42.0 11/19/2021   VD25OH 16.13 (L) 05/09/2021    Lab Results  Component Value Date   PREALBUMIN 32.7 05/09/2021   PREALBUMIN 14.8 (L) 12/14/2018      Latest Ref Rng & Units 01/10/2024    2:58 AM 01/08/2024    5:41 AM 01/08/2024   12:46 AM  CBC EXTENDED  WBC 4.0 - 10.5 K/uL 11.8  15.1  11.6   RBC 4.22 - 5.81 MIL/uL 3.80  3.74  4.28   Hemoglobin 13.0 - 17.0 g/dL 89.3  89.4  87.9   HCT 39.0 - 52.0 % 33.3  34.2  38.0   Platelets 150 - 400 K/uL 127   141  148   NEUT# 1.7 - 7.7 K/uL 10.1  13.5  10.6   Lymph# 0.7 - 4.0 K/uL 0.4  0.3  0.3      There is no height or weight on file to calculate BMI.  Orders:  No orders of the defined types were placed in this encounter.  No orders of the defined types were placed in this encounter.    Procedures: No procedures performed  Clinical Data: No additional findings.  ROS:  All other systems negative, except as noted in the HPI. Review of Systems  Objective: Vital Signs: There were no vitals taken for this visit.  Specialty Comments:  No specialty comments available.  PMFS History: Patient Active Problem List   Diagnosis Date Noted   SIRS (systemic inflammatory response syndrome) (HCC) 01/08/2024   Hypotension 01/08/2024   (  HFpEF) heart failure with preserved ejection fraction (HCC) 01/08/2024   Right knee pain 01/08/2024   Gastroesophageal reflux disease without esophagitis 12/22/2023   CAP (community acquired pneumonia) 10/29/2023   Acute respiratory failure with hypoxia (HCC) 05/05/2023   Ascites 05/05/2023   Hyponatremia 05/05/2023   Right below-knee amputee (HCC) 07/29/2022   Sepsis due to pneumonia (HCC) 07/23/2022   Foot infection 07/22/2022   Acute respiratory failure (HCC) 07/22/2022   Anemia due to chronic blood loss 06/23/2022   Heme positive stool 06/23/2022   Benign neoplasm of cecum 06/23/2022   Benign neoplasm of transverse colon 06/23/2022   Benign neoplasm of descending colon 06/23/2022   Abscess of right foot 05/17/2022   Contraindication to anticoagulation therapy GU Bleed 03/13/2022   Permanent atrial fibrillation (HCC) 08/18/2021   Anemia of chronic renal failure 08/18/2021   Thrombocytopenia 08/18/2021   Encephalopathy 08/18/2021   Action induced myoclonus 08/18/2021   Left below-knee amputee (HCC) 05/11/2021   Anemia of chronic disease 09/13/2020   COVID-19 virus infection 09/13/2020   Pressure injury of skin 06/29/2020   Gross hematuria  06/16/2020   Typical atrial flutter (HCC) 03/24/2020   Bilateral pleural effusion 02/24/2020   Healthcare maintenance 01/28/2020   Shortness of breath 01/28/2020   History of partial ray amputation of fourth toe of right foot 09/08/2019   Chronic cough 06/25/2019   Cutaneous abscess of right foot    Subacute osteomyelitis, right ankle and foot (HCC) 12/14/2018   AKI (acute kidney injury) 12/14/2018   ESRD on dialysis (HCC) 12/14/2018   S/P CABG (coronary artery bypass graft) 12/11/2018   Gait abnormality 08/24/2018   Diabetic neuropathy (HCC) 02/06/2018   Cervical myelopathy (HCC) 02/06/2018   Onychomycosis 10/30/2017   Other spondylosis with radiculopathy, cervical region 01/27/2017   Midfoot ulcer, right, limited to breakdown of skin (HCC) 11/15/2016   Lateral epicondylitis, left elbow 08/12/2016   Prostate cancer (HCC) 06/19/2016   Cellulitis of right foot 12/24/2015   Gout 09/14/2012   Essential hypertension 09/14/2012   Hypothyroidism 09/14/2012   CAD (coronary artery disease) 09/14/2012   Insulin  dependent type 2 diabetes mellitus (HCC) 09/14/2012   Hyperlipidemia associated with type 2 diabetes mellitus (HCC)    Neuropathy (HCC)    Past Medical History:  Diagnosis Date   Acute osteomyelitis of metatarsal bone of left foot (HCC)    Ambulates with cane    straight cane   Anemia    Cervical myelopathy (HCC) 02/06/2018   Chronic kidney disease    dailysis [facility]- home   Community acquired pneumonia of left lower lobe of lung 08/18/2021   Complication of anesthesia    Coronary artery disease    Dehiscence of amputation stump of left lower extremity (HCC)    Diabetes mellitus without complication (HCC)    type2   Diabetic foot ulcer (HCC)    Diabetic neuropathy (HCC) 02/06/2018   Dysrhythmia    Gait abnormality 08/24/2018   GERD (gastroesophageal reflux disease)     01/06/20- not current   Gout    History of blood transfusion    History of blood transfusion     History of kidney stones    passed stones   Hypercholesteremia    Hypertension    Hypothyroidism    Neuromuscular disorder (HCC)    neuropathy left leg and bilateral feet   Neuropathy    Partial nontraumatic amputation of foot, 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   PONV (postoperative nausea and vomiting)    Prostate cancer (HCC)  PVD (peripheral vascular disease)    with amputations    Family History  Problem Relation Age of Onset   Diabetes Mellitus II Mother    Kidney disease Mother    Diabetes Mellitus II Father    CAD Father    Cancer Father        prostate   Kidney disease Father    Diabetes Mellitus II Brother    Kidney disease Brother    Diabetes Mellitus II Brother    Stomach cancer Brother 47   Kidney disease Brother    Colon cancer Neg Hx    Colon polyps Neg Hx    Esophageal cancer Neg Hx    Rectal cancer Neg Hx    Pancreatic cancer Neg Hx     Past Surgical History:  Procedure Laterality Date   A-FLUTTER ABLATION N/A 04/06/2020   Procedure: A-FLUTTER ABLATION;  Surgeon: Waddell Danelle ORN, MD;  Location: [facility] INVASIVE CV LAB;  Service: Cardiovascular;  Laterality: N/A;   A/V FISTULAGRAM N/A 03/05/2023   Procedure: A/V Fistulagram;  Surgeon: Pearline [name] RAMAN, MD;  Location[facility] INVASIVE CV LAB;  Service: Vascular;  Laterality: N/A;   A/V SHUNT INTERVENTION Left 11/07/2023   Procedure: A/V SHUNT INTERVENTION;  Surgeon: Gretta Lonni PARAS, MD;  Location: [facility] PV LAB;  Service: Cardiovascular;  Laterality: Left;   AMPUTATION Left 12/25/2013   Procedure: AMPUTATION RAY LEFT 5TH RAY;  Surgeon: Jerona Harden GAILS, MD;  Location: [facility] ORS;  Service: Orthopedics;  Laterality: Left;   AMPUTATION Right 12/15/2018   Procedure: AMPUTATION OF 4TH AND 5TH TOES RIGHT FOOT;  Surgeon: Harden Jerona GAILS, MD;  Location: [facility] OR;  Service: Orthopedics;  Laterality: Right;   AMPUTATION Left 02/02/2021   Procedure: LEFT FOOT 4TH RAY AMPUTATION;  Surgeon: Harden Jerona GAILS, MD;  Location: [facility] OR;  Service:  Orthopedics;  Laterality: Left;   AMPUTATION Left 05/09/2021   Procedure: LEFT BELOW KNEE AMPUTATION;  Surgeon: Harden Jerona GAILS, MD;  Location: [facility] OR;  Service: Orthopedics;  Laterality: Left;   AMPUTATION Right 07/26/2022   Procedure: RIGHT BELOW KNEE AMPUTATION;  Surgeon: Harden Jerona GAILS, MD;  Location: [facility] OR;  Service: Orthopedics;  Laterality: Right;   APPLICATION OF WOUND VAC Right 12/15/2018   Procedure: APPLICATION OF WOUND VAC;  Surgeon: Harden Jerona GAILS, MD;  Location: [facility] OR;  Service: Orthopedics;  Laterality: Right;   AV FISTULA PLACEMENT Left 03/25/2019   Procedure: LEFT ARM ARTERIOVENOUS (AV) FISTULA CREATION;  Surgeon: Serene Gaile ORN, MD;  Location: [facility] OR;  Service: Vascular;  Laterality: Left;   BACK SURGERY     BASCILIC VEIN TRANSPOSITION Left 05/20/2019   Procedure: SECOND STAGE LEFT BASCILIC VEIN TRANSPOSITION;  Surgeon: Serene Gaile ORN, MD;  Location: [facility] OR;  Service: Vascular;  Laterality: Left;   BIOPSY  06/22/2022   Procedure: BIOPSY;  Surgeon: Wilhelmenia Aloha Raddle., MD;  Location: [facility] ENDOSCOPY;  Service: Gastroenterology;;   CARDIAC CATHETERIZATION  02/17/2014   CATARACT EXTRACTION Bilateral    CHOLECYSTECTOMY     COLONOSCOPY  2011   [place], Normal   COLONOSCOPY N/A 06/23/2022   Procedure: COLONOSCOPY;  Surgeon: Legrand Victory LITTIE DOUGLAS, MD;  Location: [facility] ENDOSCOPY;  Service: Gastroenterology;  Laterality: N/A;   CORONARY ARTERY BYPASS GRAFT  2008   CYSTOSCOPY N/A 06/29/2020   Procedure: CYSTOSCOPY WITH CLOT EVACUATION AND  FULGERATION;  Surgeon: Matilda Senior, MD;  Location: [facility] OR;  Service: Urology;  Laterality: N/A;   CYSTOSCOPY WITH FULGERATION Bilateral 06/17/2020   Procedure:  CYSTOSCOPY,BILATERAL RETROGRADE, CLOT EVACUATION WITH FULGERATION OF THE BLADDER;  Surgeon: Selma Donnice SAUNDERS, MD;  Location: [facility] OR;  Service: Urology;  Laterality: Bilateral;   CYSTOSCOPY WITH FULGERATION N/A 06/28/2020   Procedure: CYSTOSCOPY WITH CLOT EVACUATION AND FULGERATION OF  BLEEDERS;  Surgeon: Matilda Senior, MD;  Location: [facility] OR;  Service: Urology;  Laterality: N/A;  1 HR   ENTEROSCOPY N/A 06/22/2022   Procedure: ENTEROSCOPY;  Surgeon: Wilhelmenia Aloha Raddle., MD;  Location: [facility] ENDOSCOPY;  Service: Gastroenterology;  Laterality: N/A;   HEMOSTASIS CLIP PLACEMENT  06/22/2022   Procedure: HEMOSTASIS CLIP PLACEMENT;  Surgeon: Wilhelmenia Aloha Raddle., MD;  Location: [facility] ENDOSCOPY;  Service: Gastroenterology;;   HOT HEMOSTASIS N/A 06/22/2022   Procedure: HOT HEMOSTASIS (ARGON PLASMA COAGULATION/BICAP);  Surgeon: Wilhelmenia Aloha Raddle., MD;  Location: [facility] ENDOSCOPY;  Service: Gastroenterology;  Laterality: N/A;   I & D EXTREMITY Right 12/15/2018   Procedure: DEBRIDEMENT RIGHT FOOT;  Surgeon: Harden Jerona GAILS, MD;  Location: [facility] OR;  Service: Orthopedics;  Laterality: Right;   I & D EXTREMITY Right 08/27/2019   Procedure: PARTIAL CUBOID EXCISION RIGHT FOOT;  Surgeon: Harden Jerona GAILS, MD;  Location: [facility] OR;  Service: Orthopedics;  Laterality: Right;   I & D EXTREMITY Right 01/07/2020   Procedure: RIGHT FOOT EXCISION INFECTED BONE;  Surgeon: Harden Jerona GAILS, MD;  Location: [facility] OR;  Service: Orthopedics;  Laterality: Right;   I & D EXTREMITY Right 05/17/2022   Procedure: IRRIGATION AND DEBRIDEMENT RIGHT FOOT ABSCESS;  Surgeon: Harden Jerona GAILS, MD;  Location: [facility] OR;  Service: Orthopedics;  Laterality: Right;   IR PARACENTESIS  10/11/2022   IR PARACENTESIS  12/24/2022   IR PARACENTESIS  02/06/2023   IR PARACENTESIS  05/05/2023   IR PARACENTESIS  05/19/2023   IR PARACENTESIS  07/11/2023   IR PARACENTESIS  07/30/2023   IR PARACENTESIS  08/20/2023   IR PARACENTESIS  09/19/2023   IR PARACENTESIS  10/03/2023   IR PARACENTESIS  10/17/2023   IR PARACENTESIS  10/30/2023   IR PARACENTESIS  11/19/2023   IR PARACENTESIS  12/03/2023   IR PARACENTESIS  12/16/2023   IR PARACENTESIS  01/12/2024   NECK SURGERY     novemver 2019   PERIPHERAL VASCULAR BALLOON ANGIOPLASTY Left 03/05/2023    Procedure: PERIPHERAL VASCULAR BALLOON ANGIOPLASTY;  Surgeon: Pearline [name] RAMAN, MD;  Location: [facility] INVASIVE CV LAB;  Service: Vascular;  Laterality: Left;  AVF   POLYPECTOMY  06/22/2022   Procedure: POLYPECTOMY;  Surgeon: Mansouraty, Aloha Raddle., MD;  Location: [facility] ENDOSCOPY;  Service: Gastroenterology;;   POLYPECTOMY  06/23/2022   Procedure: POLYPECTOMY;  Surgeon: Legrand Victory LITTIE DOUGLAS, MD;  Location: [facility] ENDOSCOPY;  Service: Gastroenterology;;   STUMP REVISION Left 06/27/2021   Procedure: REVISION LEFT BELOW KNEE AMPUTATION;  Surgeon: Harden Jerona GAILS, MD;  Location: [facility] OR;  Service: Orthopedics;  Laterality: Left;   SUBMUCOSAL LIFTING INJECTION  06/22/2022   Procedure: SUBMUCOSAL LIFTING INJECTION;  Surgeon: Wilhelmenia Aloha Raddle., MD;  Location: [facility] ENDOSCOPY;  Service: Gastroenterology;;   SUBMUCOSAL TATTOO INJECTION  06/22/2022   Procedure: SUBMUCOSAL TATTOO INJECTION;  Surgeon: Wilhelmenia Aloha Raddle., MD;  Location: [facility] ENDOSCOPY;  Service: Gastroenterology;;   TRANSURETHRAL RESECTION OF PROSTATE N/A 06/28/2020   Procedure: TRANSURETHRAL RESECTION OF THE PROSTATE (TURP);  Surgeon: Matilda Senior, MD;  Location: [facility] OR;  Service: Urology;  Laterality: N/A;   VENOUS ANGIOPLASTY  11/07/2023   Procedure: VENOUS ANGIOPLASTY;  Surgeon: Gretta Lonni PARAS, MD;  Location: [facility] PV LAB;  Service: Cardiovascular;;  Basilic Vein, Subclavian  Vein   WISDOM TOOTH EXTRACTION     Social History   Occupational History   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

## 2024-02-04 ENCOUNTER — Other Ambulatory Visit: Payer: Self-pay

## 2024-02-04 ENCOUNTER — Telehealth: Payer: Self-pay

## 2024-02-04 ENCOUNTER — Encounter: Payer: Self-pay | Admitting: Internal Medicine

## 2024-02-04 ENCOUNTER — Ambulatory Visit: Admitting: Internal Medicine

## 2024-02-04 ENCOUNTER — Ambulatory Visit
Admission: RE | Admit: 2024-02-04 | Discharge: 2024-02-04 | Disposition: A | Source: Ambulatory Visit | Attending: Internal Medicine

## 2024-02-04 VITALS — BP 145/79 | HR 62 | Temp 97.7°F

## 2024-02-04 DIAGNOSIS — M25561 Pain in right knee: Secondary | ICD-10-CM

## 2024-02-04 DIAGNOSIS — N186 End stage renal disease: Secondary | ICD-10-CM | POA: Diagnosis not present

## 2024-02-04 DIAGNOSIS — L03115 Cellulitis of right lower limb: Secondary | ICD-10-CM | POA: Diagnosis not present

## 2024-02-04 DIAGNOSIS — Z992 Dependence on renal dialysis: Secondary | ICD-10-CM | POA: Diagnosis not present

## 2024-02-04 DIAGNOSIS — L270 Generalized skin eruption due to drugs and medicaments taken internally: Secondary | ICD-10-CM

## 2024-02-04 DIAGNOSIS — M71161 Other infective bursitis, right knee: Secondary | ICD-10-CM | POA: Diagnosis present

## 2024-02-04 DIAGNOSIS — B9561 Methicillin susceptible Staphylococcus aureus infection as the cause of diseases classified elsewhere: Secondary | ICD-10-CM

## 2024-02-04 DIAGNOSIS — T361X5A Adverse effect of cephalosporins and other beta-lactam antibiotics, initial encounter: Secondary | ICD-10-CM

## 2024-02-04 NOTE — Progress Notes (Signed)
 Patient: [name]  DOB: 08-Mar-1951 MRN: [id number] PCP: [name], NP    Chief Complaint  Patient presents with   Follow-up     Patient Active Problem List   Diagnosis Date Noted   SIRS (systemic inflammatory response syndrome) (HCC) 01/08/2024   Hypotension 01/08/2024   (HFpEF) heart failure with preserved ejection fraction (HCC) 01/08/2024   Right knee pain 01/08/2024   Gastroesophageal reflux disease without esophagitis 12/22/2023   CAP (community acquired pneumonia) 10/29/2023   Acute respiratory failure with hypoxia (HCC) 05/05/2023   Ascites 05/05/2023   Hyponatremia 05/05/2023   Right below-knee amputee (HCC) 07/29/2022   Sepsis due to pneumonia (HCC) 07/23/2022   Foot infection 07/22/2022   Acute respiratory failure (HCC) 07/22/2022   Anemia due to chronic blood loss 06/23/2022   Heme positive stool 06/23/2022   Benign neoplasm of cecum 06/23/2022   Benign neoplasm of transverse colon 06/23/2022   Benign neoplasm of descending colon 06/23/2022   Abscess of right foot 05/17/2022   Contraindication to anticoagulation therapy GU Bleed 03/13/2022   Permanent atrial fibrillation (HCC) 08/18/2021   Anemia of chronic renal failure 08/18/2021   Thrombocytopenia 08/18/2021   Encephalopathy 08/18/2021   Action induced myoclonus 08/18/2021   Left below-knee amputee (HCC) 05/11/2021   Anemia of chronic disease 09/13/2020   COVID-19 virus infection 09/13/2020   Pressure injury of skin 06/29/2020   Gross hematuria 06/16/2020   Typical atrial flutter (HCC) 03/24/2020   Bilateral pleural effusion 02/24/2020   Healthcare maintenance 01/28/2020   Shortness of breath 01/28/2020   History of partial ray amputation of fourth toe of right foot 09/08/2019   Chronic cough 06/25/2019   Cutaneous abscess of right foot    Subacute osteomyelitis, right ankle and foot (HCC) 12/14/2018   AKI (acute kidney injury) 12/14/2018   ESRD on dialysis (HCC) 12/14/2018   S/P  CABG (coronary artery bypass graft) 12/11/2018   Gait abnormality 08/24/2018   Diabetic neuropathy (HCC) 02/06/2018   Cervical myelopathy (HCC) 02/06/2018   Onychomycosis 10/30/2017   Other spondylosis with radiculopathy, cervical region 01/27/2017   Midfoot ulcer, right, limited to breakdown of skin (HCC) 11/15/2016   Lateral epicondylitis, left elbow 08/12/2016   Prostate cancer (HCC) 06/19/2016   Cellulitis of right foot 12/24/2015   Gout 09/14/2012   Essential hypertension 09/14/2012   Hypothyroidism 09/14/2012   CAD (coronary artery disease) 09/14/2012   Insulin  dependent type 2 diabetes mellitus (HCC) 09/14/2012   Hyperlipidemia associated with type 2 diabetes mellitus (HCC)    Neuropathy (HCC)      Subjective:  [name] is a 73 y.o. M with past medical history of ESRD on HD, PAF not on AC due to GI bleed, chronic anemia, CAD status post CABG 2005, diabetes mellitus, PAD s/p bilateral BKA, recurrent ascites with frequent paracentesis presented on [place] (knee pain.  ID was engaged given concerns for right knee infection.  Found to have right knee septic bursitis.  Orthopedics was engaged patient underwent bedside aspiration with 2 cc of purulent fluid aspirated from the prepatellar septic bursa.  Ortho did not think patient is surgical management.  Cultures grew MSSA from aspiration..  Patient was discharged on 4 weeks antibiotics cefazolin  with HD.  There are some concern for rash to cefazolin /transferred from to vancomycin .  Patient tolerating antibiotics.  He states that right knee is still feels red but he has had increased pressure on it. Review of Systems  All other systems reviewed and are  negative.   Past Medical History:  Diagnosis Date   Acute osteomyelitis of metatarsal bone of left foot (HCC)    Ambulates with cane    straight cane   Anemia    Cervical myelopathy (HCC) 02/06/2018   Chronic kidney disease    dailysis M W F- home   Community acquired pneumonia of  left lower lobe of lung 08/18/2021   Complication of anesthesia    Coronary artery disease    Dehiscence of amputation stump of left lower extremity (HCC)    Diabetes mellitus without complication (HCC)    type2   Diabetic foot ulcer (HCC)    Diabetic neuropathy (HCC) 02/06/2018   Dysrhythmia    Gait abnormality 08/24/2018   GERD (gastroesophageal reflux disease)     01/06/20- not current   Gout    History of blood transfusion    History of blood transfusion    History of kidney stones    passed stones   Hypercholesteremia    Hypertension    Hypothyroidism    Neuromuscular disorder (HCC)    neuropathy left leg and bilateral feet   Neuropathy    Partial nontraumatic amputation of foot, 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   PONV (postoperative nausea and vomiting)    Prostate cancer (HCC)    PVD (peripheral vascular disease)    with amputations    Outpatient Medications Prior to Visit  Medication Sig Dispense Refill   allopurinol  (ZYLOPRIM ) 100 MG tablet Take 2 tablets (200 mg total) by mouth daily. 30 tablet 0   levothyroxine  (SYNTHROID ) 112 MCG tablet Take 112 mcg by mouth daily before breakfast.     modafinil  (PROVIGIL ) 200 MG tablet Take 1 tablet (200 mg total) by mouth daily. 90 tablet 0   pantoprazole  (PROTONIX ) 40 MG tablet Take 1 tablet (40 mg total) by mouth daily. 90 tablet 2   pregabalin  (LYRICA ) 75 MG capsule Take 1 capsule (75 mg total) by mouth daily. 30 capsule 0   rosuvastatin  (CRESTOR ) 10 MG tablet Take 1 tablet (10 mg total) by mouth daily. 90 tablet 1   sevelamer  carbonate (RENVELA ) 800 MG tablet Take 800 mg by mouth 3 (three) times daily.     ceFAZolin  (ANCEF ) IVPB Inject 2 g into the vein Every Tuesday,Thursday,and Saturday with dialysis for 20 days. Indication:  MSSA right knee bursitis Last Day of Therapy:  02/04/2024 Labs - Once weekly:  CBC/D and BMP, Labs - Once weekly: ESR and CRP Method of administration: Per HD to be given at HD center (Patient not taking:  Reported on 02/04/2024)     No facility-administered medications prior to visit.     Allergies  Allergen Reactions   Cefazolin  Dermatitis   Morphine  Other (See Comments)    Other reaction(s): Delusions (intolerance)  Other Reaction(s): delusions   Mushroom Extract Complex (Obsolete) Nausea Only    Social History   Tobacco Use   Smoking status: Former    Types: Cigars    Start date: 04/08/1973    Quit date: 12/24/1988    Years since quitting: 35.1   Smokeless tobacco: Never   Tobacco comments:    Cigars and Pipe   Vaping Use   Vaping status: Never Used  Substance Use Topics   Alcohol  use: Never   Drug use: Never    Family History  Problem Relation Age of Onset   Diabetes Mellitus II Mother    Kidney disease Mother    Diabetes Mellitus II Father    CAD Father  Cancer Father        prostate   Kidney disease Father    Diabetes Mellitus II Brother    Kidney disease Brother    Diabetes Mellitus II Brother    Stomach cancer Brother 54   Kidney disease Brother    Colon cancer Neg Hx    Colon polyps Neg Hx    Esophageal cancer Neg Hx    Rectal cancer Neg Hx    Pancreatic cancer Neg Hx     Objective:   Vitals:   02/04/24 1403  BP: (!) 145/79  Pulse: 62  Temp: 97.7 F (36.5 C)  TempSrc: Temporal   There is no height or weight on file to calculate BMI.  Physical Exam Constitutional:      General: He is not in acute distress.    Appearance: He is normal weight. He is not toxic-appearing.  HENT:     Head: Normocephalic and atraumatic.     Right Ear: External ear normal.     Left Ear: External ear normal.     Nose: No congestion or rhinorrhea.     Mouth/Throat:     Mouth: Mucous membranes are moist.     Pharynx: Oropharynx is clear.  Eyes:     Extraocular Movements: Extraocular movements intact.     Conjunctiva/sclera: Conjunctivae normal.     Pupils: Pupils are equal, round, and reactive to light.  Cardiovascular:     Rate and Rhythm: Normal rate  and regular rhythm.     Heart sounds: No murmur heard.    No friction rub. No gallop.  Pulmonary:     Effort: Pulmonary effort is normal.     Breath sounds: Normal breath sounds.  Abdominal:     General: Abdomen is flat. Bowel sounds are normal.     Palpations: Abdomen is soft.  Musculoskeletal:     Cervical back: Normal range of motion and neck supple.  Skin:    General: Skin is warm and dry.  Neurological:     General: No focal deficit present.     Mental Status: He is oriented to person, place, and time.  Psychiatric:        Mood and Affect: Mood normal.     Lab Results: Lab Results  Component Value Date   WBC 11.8 (H) 01/10/2024   HGB 10.6 (L) 01/10/2024   HCT 33.3 (L) 01/10/2024   MCV 87.6 01/10/2024   PLT 127 (L) 01/10/2024    Lab Results  Component Value Date   CREATININE 6.90 (H) 01/10/2024   BUN 54 (H) 01/10/2024   NA 137 01/10/2024   K 5.1 01/10/2024   CL 98 01/10/2024   CO2 22 01/10/2024    Lab Results  Component Value Date   ALT 12 01/08/2024   AST 22 01/08/2024   ALKPHOS 165 (H) 01/08/2024   BILITOT 1.0 01/08/2024     Assessment & Plan:  #Septic bursitis with cellulitis of right knee #Rash with cefazolin  #ESRD with HD - Patient was seen by orthopedics during hospitalization knee was aspirated cultures grew MSSA on 10/5.  Patient on ceftriaxone  then transition cefazolin  to complete 4 weeks of antibiotics.  On 10/14 patient developed rash with cefazolin  was switched to vancomycin  with HD -Patient states that overall he feels better.  He states right lower extremity stump is slightly erythematous but he thinks that is due to pressure.  Did not look actively infected.  As we do not have imaging right lower extremity and patient care  concerned with stump erythema will get x-ray. Plan: - Extend vancomycin  for 2 weeks - X-ray right knee - Labs: Cbc, cmp ,esr crp. Hardstick her in lab -Continue follow-up Ortho  #Labs  #Medication management - WBcC  13K on January 22, 2024, Vanc trough 13.6 on October 21  Loney Stank, MD [facility]   02/12/24  3:30 PM   I have personally spent 45 minutes involved in face-to-face and non-face-to-face activities for this patient on the day of the visit. Professional time spent includes the following activities: Preparing to see the patient (review of tests), Obtaining and/or reviewing separately obtained history (admission/discharge record), Performing a medically appropriate examination and/or evaluation , Ordering medications/tests/procedures, referring and communicating with other health care professionals, Documenting clinical information in the EMR, Independently interpreting results (not separately reported), Communicating results to the patient/family/caregiver, Counseling and educating the patient/family/caregiver and Care coordination (not separately reported).

## 2024-02-04 NOTE — Telephone Encounter (Signed)
 Per Dr. Dennise, extend OPAT to 11/12.  Spoke with Montie at [facility], notified her that extension orders for vancomycin  are being faxed.   Orders faxed to [phone]  Yarrow Linhart, BSN, RN

## 2024-02-04 NOTE — Telephone Encounter (Signed)
 Called [facility] on [address] and requested patient's labs be faxed to triage.   Shalayna Ornstein, BSN, RN

## 2024-02-06 ENCOUNTER — Ambulatory Visit (HOSPITAL_COMMUNITY)
Admission: RE | Admit: 2024-02-06 | Discharge: 2024-02-06 | Disposition: A | Discharge status: Home / Self Care | Source: Ambulatory Visit | Attending: Nephrology | Admitting: Nephrology

## 2024-02-06 ENCOUNTER — Other Ambulatory Visit (HOSPITAL_COMMUNITY): Payer: Self-pay | Admitting: Nephrology

## 2024-02-06 DIAGNOSIS — R188 Other ascites: Secondary | ICD-10-CM | POA: Diagnosis present

## 2024-02-06 HISTORY — PX: IR PARACENTESIS: IMG2679

## 2024-02-06 MED ORDER — LIDOCAINE HCL 1 % IJ SOLN
INTRAMUSCULAR | Status: AC
  Filled 2024-02-06: qty 20

## 2024-02-06 MED ORDER — LIDOCAINE HCL 1 % IJ SOLN
20.0000 mL | Freq: Once | INTRAMUSCULAR | Status: AC
  Administered 2024-02-06: 10 mL

## 2024-02-06 NOTE — Procedures (Signed)
 PROCEDURE SUMMARY:  Successful image-guided paracentesis from the left abdomen.  Yielded 8.7 liters of yellow fluid.  No immediate complications.  EBL: zero Patient tolerated well.   Specimen not sent for labs.  Please see imaging section of Epic for full dictation.  Karalynn Cottone NP 02/06/2024 11:27 AM

## 2024-02-09 ENCOUNTER — Encounter: Payer: Self-pay | Admitting: Radiology

## 2024-02-16 ENCOUNTER — Encounter: Payer: Self-pay | Admitting: Orthopedic Surgery

## 2024-02-16 ENCOUNTER — Other Ambulatory Visit: Payer: Self-pay | Admitting: Orthopedic Surgery

## 2024-02-16 ENCOUNTER — Ambulatory Visit (HOSPITAL_COMMUNITY)
Admission: RE | Admit: 2024-02-16 | Discharge: 2024-02-16 | Disposition: A | Discharge status: Home / Self Care | Source: Ambulatory Visit | Attending: Orthopedic Surgery | Admitting: Orthopedic Surgery

## 2024-02-16 ENCOUNTER — Ambulatory Visit: Admitting: Orthopedic Surgery

## 2024-02-16 ENCOUNTER — Encounter

## 2024-02-16 DIAGNOSIS — I70248 Atherosclerosis of native arteries of left leg with ulceration of other part of lower left leg: Secondary | ICD-10-CM | POA: Diagnosis present

## 2024-02-16 DIAGNOSIS — L97929 Non-pressure chronic ulcer of unspecified part of left lower leg with unspecified severity: Secondary | ICD-10-CM | POA: Diagnosis not present

## 2024-02-16 NOTE — Progress Notes (Signed)
 Office Visit Note   Patient: [name]           Date of Birth: July 09, 1950           MRN: [id number] Visit Date: 02/16/2024              Requested by: [name], NP [address] [place],  [place] [address] PCP: [name], NP  Chief Complaint  Patient presents with   Right Knee - Follow-up   Left Knee - Follow-up      HPI: Discussed the use of AI scribe software for clinical note transcription with the patient, who gave verbal consent to proceed.  History of Present Illness [name] is a 73 year old male with vascular disease who presents with concerns about decreased circulation in his left leg.  He has a history of vascular disease, which previously led to the amputation of his legs. He reports that his left leg feels much colder than the right.  His right leg has full mobility, reduced swelling, and a shrunken stump, allowing him to consider walking on it. However, the left leg is much colder than the right, causing significant concern.  He is currently taking antibiotics and has a follow-up appointment with an infectious disease specialist on Wednesday. The decreased circulation is likely due to the blood vessels not getting enough circulation down to the stump.     Assessment & Plan: Visit Diagnoses:  1. Ischemic ulcer of left lower leg due to atherosclerosis [facility]     Plan: Assessment and Plan Assessment & Plan Critical limb ischemia of left lower extremity stump due to atherosclerosis with gangrene and ulceration Left lower extremity stump with critical ischemia, gangrene, and ulceration. Severe vascular compromise with risk of limb loss. - Urgent referral to vascular surgery for evaluation and potential endovascular intervention.  Peripheral arterial disease of right lower extremity stump Right lower extremity stump with peripheral arterial disease. Chronic condition with reduced circulation.      Follow-Up Instructions:  Return in about 4 weeks (around 03/15/2024).   Ortho Exam  Patient is alert, oriented, no adenopathy, well-dressed, normal affect, normal respiratory effort. Physical Exam CARDIOVASCULAR: Femoral pulse not palpable. EXTREMITIES: Right lower extremity cool to touch. Left lower extremity extremely cold to touch. Circumferential ischemic ulcers on entire residual limb.      Imaging: No results found. No images are attached to the encounter.  Labs: Lab Results  Component Value Date   HGBA1C 4.9 10/29/2023   HGBA1C 5.0 05/17/2022   HGBA1C 5.5 05/04/2021   ESRSEDRATE 34 (H) 01/24/2020   ESRSEDRATE 41 (H) 01/10/2020   ESRSEDRATE 55 (H) 12/27/2019   CRP 8.8 (H) 01/24/2020   CRP 35.9 (H) 01/10/2020   CRP 77.9 (H) 12/27/2019   LABURIC 4.2 01/09/2024   REPTSTATUS 01/14/2024 FINAL 01/11/2024   GRAMSTAIN  01/11/2024    FEW WBC PRESENT, PREDOMINANTLY PMN MODERATE GRAM POSITIVE COCCI Performed at [facility], [address] [place], [place] [address]    CULT ABUNDANT STAPHYLOCOCCUS AUREUS 01/11/2024   LABORGA STAPHYLOCOCCUS AUREUS 01/11/2024     Lab Results  Component Value Date   ALBUMIN  2.0 (L) 01/10/2024   ALBUMIN  1.7 (L) 01/08/2024   ALBUMIN  2.3 (L) 01/08/2024   PREALBUMIN 32.7 05/09/2021   PREALBUMIN 14.8 (L) 12/14/2018    Lab Results  Component Value Date   MG 2.0 01/15/2024   MG 2.0 01/13/2024   MG 2.2 01/12/2024   Lab Results  Component  Value Date   VD25OH 42.0 11/19/2021   VD25OH 16.13 (L) 05/09/2021    Lab Results  Component Value Date   PREALBUMIN 32.7 05/09/2021   PREALBUMIN 14.8 (L) 12/14/2018      Latest Ref Rng & Units 01/10/2024    2:58 AM 01/08/2024    5:41 AM 01/08/2024   12:46 AM  CBC EXTENDED  WBC 4.0 - 10.5 K/uL 11.8  15.1  11.6   RBC 4.22 - 5.81 MIL/uL 3.80  3.74  4.28   Hemoglobin 13.0 - 17.0 g/dL 89.3  89.4  87.9   HCT 39.0 - 52.0 % 33.3  34.2  38.0   Platelets 150 - 400 K/uL 127  141  148   NEUT# 1.7 - 7.7 K/uL 10.1  13.5   10.6   Lymph# 0.7 - 4.0 K/uL 0.4  0.3  0.3      There is no height or weight on file to calculate BMI.  Orders:  No orders of the defined types were placed in this encounter.  No orders of the defined types were placed in this encounter.    Procedures: No procedures performed  Clinical Data: No additional findings.  ROS:  All other systems negative, except as noted in the HPI. Review of Systems  Objective: Vital Signs: There were no vitals taken for this visit.  Specialty Comments:  No specialty comments available.  PMFS History: Patient Active Problem List   Diagnosis Date Noted   SIRS (systemic inflammatory response syndrome) (HCC) 01/08/2024   Hypotension 01/08/2024   (HFpEF) heart failure with preserved ejection fraction (HCC) 01/08/2024   Right knee pain 01/08/2024   Gastroesophageal reflux disease without esophagitis 12/22/2023   CAP (community acquired pneumonia) 10/29/2023   Acute respiratory failure with hypoxia (HCC) 05/05/2023   Ascites 05/05/2023   Hyponatremia 05/05/2023   Right below-knee amputee (HCC) 07/29/2022   Sepsis due to pneumonia (HCC) 07/23/2022   Foot infection 07/22/2022   Acute respiratory failure (HCC) 07/22/2022   Anemia due to chronic blood loss 06/23/2022   Heme positive stool 06/23/2022   Benign neoplasm of cecum 06/23/2022   Benign neoplasm of transverse colon 06/23/2022   Benign neoplasm of descending colon 06/23/2022   Abscess of right foot 05/17/2022   Contraindication to anticoagulation therapy GU Bleed 03/13/2022   Permanent atrial fibrillation (HCC) 08/18/2021   Anemia of chronic renal failure 08/18/2021   Thrombocytopenia 08/18/2021   Encephalopathy 08/18/2021   Action induced myoclonus 08/18/2021   Left below-knee amputee (HCC) 05/11/2021   Anemia of chronic disease 09/13/2020   COVID-19 virus infection 09/13/2020   Pressure injury of skin 06/29/2020   Gross hematuria 06/16/2020   Typical atrial flutter (HCC)  03/24/2020   Bilateral pleural effusion 02/24/2020   Healthcare maintenance 01/28/2020   Shortness of breath 01/28/2020   History of partial ray amputation of fourth toe of right foot 09/08/2019   Chronic cough 06/25/2019   Cutaneous abscess of right foot    Subacute osteomyelitis, right ankle and foot (HCC) 12/14/2018   AKI (acute kidney injury) 12/14/2018   ESRD on dialysis (HCC) 12/14/2018   S/P CABG (coronary artery bypass graft) 12/11/2018   Gait abnormality 08/24/2018   Diabetic neuropathy (HCC) 02/06/2018   Cervical myelopathy (HCC) 02/06/2018   Onychomycosis 10/30/2017   Other spondylosis with radiculopathy, cervical region 01/27/2017   Midfoot ulcer, right, limited to breakdown of skin (HCC) 11/15/2016   Lateral epicondylitis, left elbow 08/12/2016   Prostate cancer (HCC) 06/19/2016   Cellulitis of right foot  12/24/2015   Gout 09/14/2012   Essential hypertension 09/14/2012   Hypothyroidism 09/14/2012   CAD (coronary artery disease) 09/14/2012   Insulin  dependent type 2 diabetes mellitus (HCC) 09/14/2012   Hyperlipidemia associated with type 2 diabetes mellitus (HCC)    Neuropathy (HCC)    Past Medical History:  Diagnosis Date   Acute osteomyelitis of metatarsal bone of left foot (HCC)    Ambulates with cane    straight cane   Anemia    Cervical myelopathy (HCC) 02/06/2018   Chronic kidney disease    dailysis [facility]- home   Community acquired pneumonia of left lower lobe of lung 08/18/2021   Complication of anesthesia    Coronary artery disease    Dehiscence of amputation stump of left lower extremity (HCC)    Diabetes mellitus without complication (HCC)    type2   Diabetic foot ulcer (HCC)    Diabetic neuropathy (HCC) 02/06/2018   Dysrhythmia    Gait abnormality 08/24/2018   GERD (gastroesophageal reflux disease)     01/06/20- not current   Gout    History of blood transfusion    History of blood transfusion    History of kidney stones    passed stones    Hypercholesteremia    Hypertension    Hypothyroidism    Neuromuscular disorder (HCC)    neuropathy left leg and bilateral feet   Neuropathy    Partial nontraumatic amputation of foot, 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   PONV (postoperative nausea and vomiting)    Prostate cancer (HCC)    PVD (peripheral vascular disease)    with amputations    Family History  Problem Relation Age of Onset   Diabetes Mellitus II Mother    Kidney disease Mother    Diabetes Mellitus II Father    CAD Father    Cancer Father        prostate   Kidney disease Father    Diabetes Mellitus II Brother    Kidney disease Brother    Diabetes Mellitus II Brother    Stomach cancer Brother 30   Kidney disease Brother    Colon cancer Neg Hx    Colon polyps Neg Hx    Esophageal cancer Neg Hx    Rectal cancer Neg Hx    Pancreatic cancer Neg Hx     Past Surgical History:  Procedure Laterality Date   A-FLUTTER ABLATION N/A 04/06/2020   Procedure: A-FLUTTER ABLATION;  Surgeon: Waddell Danelle ORN, MD;  Location: [facility] INVASIVE CV LAB;  Service: Cardiovascular;  Laterality: N/A;   A/V FISTULAGRAM N/A 03/05/2023   Procedure: A/V Fistulagram;  Surgeon: Pearline [name] RAMAN, MD;  Location[facility] INVASIVE CV LAB;  Service: Vascular;  Laterality: N/A;   A/V SHUNT INTERVENTION Left 11/07/2023   Procedure: A/V SHUNT INTERVENTION;  Surgeon: Gretta Lonni PARAS, MD;  Location: [facility] PV LAB;  Service: Cardiovascular;  Laterality: Left;   AMPUTATION Left 12/25/2013   Procedure: AMPUTATION RAY LEFT 5TH RAY;  Surgeon: Jerona Harden GAILS, MD;  Location: [facility] ORS;  Service: Orthopedics;  Laterality: Left;   AMPUTATION Right 12/15/2018   Procedure: AMPUTATION OF 4TH AND 5TH TOES RIGHT FOOT;  Surgeon: Harden Jerona GAILS, MD;  Location: [facility] OR;  Service: Orthopedics;  Laterality: Right;   AMPUTATION Left 02/02/2021   Procedure: LEFT FOOT 4TH RAY AMPUTATION;  Surgeon: Harden Jerona GAILS, MD;  Location: [facility] OR;  Service: Orthopedics;  Laterality: Left;   AMPUTATION Left  05/09/2021   Procedure: LEFT BELOW KNEE AMPUTATION;  Surgeon: Harden Jerona GAILS, MD;  Location: [facility] OR;  Service: Orthopedics;  Laterality: Left;   AMPUTATION Right 07/26/2022   Procedure: RIGHT BELOW KNEE AMPUTATION;  Surgeon: Harden Jerona GAILS, MD;  Location: [facility] OR;  Service: Orthopedics;  Laterality: Right;   APPLICATION OF WOUND VAC Right 12/15/2018   Procedure: APPLICATION OF WOUND VAC;  Surgeon: Harden Jerona GAILS, MD;  Location: [facility] OR;  Service: Orthopedics;  Laterality: Right;   AV FISTULA PLACEMENT Left 03/25/2019   Procedure: LEFT ARM ARTERIOVENOUS (AV) FISTULA CREATION;  Surgeon: Serene Gaile ORN, MD;  Location: [facility] OR;  Service: Vascular;  Laterality: Left;   BACK SURGERY     BASCILIC VEIN TRANSPOSITION Left 05/20/2019   Procedure: SECOND STAGE LEFT BASCILIC VEIN TRANSPOSITION;  Surgeon: Serene Gaile ORN, MD;  Location: [facility] OR;  Service: Vascular;  Laterality: Left;   BIOPSY  06/22/2022   Procedure: BIOPSY;  Surgeon: Wilhelmenia Aloha Raddle., MD;  Location: [facility] ENDOSCOPY;  Service: Gastroenterology;;   CARDIAC CATHETERIZATION  02/17/2014   CATARACT EXTRACTION Bilateral    CHOLECYSTECTOMY     COLONOSCOPY  2011   in [place], Normal   COLONOSCOPY N/A 06/23/2022   Procedure: COLONOSCOPY;  Surgeon: Legrand Victory LITTIE DOUGLAS, MD;  Location: [facility] ENDOSCOPY;  Service: Gastroenterology;  Laterality: N/A;   CORONARY ARTERY BYPASS GRAFT  2008   CYSTOSCOPY N/A 06/29/2020   Procedure: CYSTOSCOPY WITH CLOT EVACUATION AND  FULGERATION;  Surgeon: Matilda Senior, MD;  Location: [facility] OR;  Service: Urology;  Laterality: N/A;   CYSTOSCOPY WITH FULGERATION Bilateral 06/17/2020   Procedure: CYSTOSCOPY,BILATERAL RETROGRADE, CLOT EVACUATION WITH FULGERATION OF THE BLADDER;  Surgeon: Selma Donnice SAUNDERS, MD;  Location: [facility] OR;  Service: Urology;  Laterality: Bilateral;   CYSTOSCOPY WITH FULGERATION N/A 06/28/2020   Procedure: CYSTOSCOPY WITH CLOT EVACUATION AND FULGERATION OF BLEEDERS;  Surgeon: Matilda Senior, MD;   Location: [facility] OR;  Service: Urology;  Laterality: N/A;  1 HR   ENTEROSCOPY N/A 06/22/2022   Procedure: ENTEROSCOPY;  Surgeon: Wilhelmenia Aloha Raddle., MD;  Location: [facility] ENDOSCOPY;  Service: Gastroenterology;  Laterality: N/A;   HEMOSTASIS CLIP PLACEMENT  06/22/2022   Procedure: HEMOSTASIS CLIP PLACEMENT;  Surgeon: Wilhelmenia Aloha Raddle., MD;  Location: [facility] ENDOSCOPY;  Service: Gastroenterology;;   HOT HEMOSTASIS N/A 06/22/2022   Procedure: HOT HEMOSTASIS (ARGON PLASMA COAGULATION/BICAP);  Surgeon: Wilhelmenia Aloha Raddle., MD;  Location: [facility] ENDOSCOPY;  Service: Gastroenterology;  Laterality: N/A;   I & D EXTREMITY Right 12/15/2018   Procedure: DEBRIDEMENT RIGHT FOOT;  Surgeon: Harden Jerona GAILS, MD;  Location: [facility] OR;  Service: Orthopedics;  Laterality: Right;   I & D EXTREMITY Right 08/27/2019   Procedure: PARTIAL CUBOID EXCISION RIGHT FOOT;  Surgeon: Harden Jerona GAILS, MD;  Location: [facility] OR;  Service: Orthopedics;  Laterality: Right;   I & D EXTREMITY Right 01/07/2020   Procedure: RIGHT FOOT EXCISION INFECTED BONE;  Surgeon: Harden Jerona GAILS, MD;  Location: [facility] OR;  Service: Orthopedics;  Laterality: Right;   I & D EXTREMITY Right 05/17/2022   Procedure: IRRIGATION AND DEBRIDEMENT RIGHT FOOT ABSCESS;  Surgeon: Harden Jerona GAILS, MD;  Location: [facility] OR;  Service: Orthopedics;  Laterality: Right;   IR PARACENTESIS  10/11/2022   IR PARACENTESIS  12/24/2022   IR PARACENTESIS  02/06/2023   IR PARACENTESIS  05/05/2023   IR PARACENTESIS  05/19/2023   IR PARACENTESIS  07/11/2023   IR PARACENTESIS  07/30/2023   IR PARACENTESIS  08/20/2023   IR PARACENTESIS  09/19/2023   IR PARACENTESIS  10/03/2023  IR PARACENTESIS  10/17/2023   IR PARACENTESIS  10/30/2023   IR PARACENTESIS  11/19/2023   IR PARACENTESIS  12/03/2023   IR PARACENTESIS  12/16/2023   IR PARACENTESIS  01/12/2024   IR PARACENTESIS  02/06/2024   NECK SURGERY     novemver 2019   PERIPHERAL VASCULAR BALLOON ANGIOPLASTY Left 03/05/2023   Procedure:  PERIPHERAL VASCULAR BALLOON ANGIOPLASTY;  Surgeon: Pearline [name] RAMAN, MD;  Location: [facility]VAS[facility] CV LAB;  Service: Vascular;  Laterality: Left;  AVF   POLYPECTOMY  06/22/2022   Procedure: POLYPECTOMY;  Surgeon: Mansouraty, Aloha Raddle., MD;  Location: [facility] ENDOSCOPY;  Service: Gastroenterology;;   POLYPECTOMY  06/23/2022   Procedure: POLYPECTOMY;  Surgeon: Legrand Victory LITTIE DOUGLAS, MD;  Location: [facility] ENDOSCOPY;  Service: Gastroenterology;;   STUMP REVISION Left 06/27/2021   Procedure: REVISION LEFT BELOW KNEE AMPUTATION;  Surgeon: Harden Jerona GAILS, MD;  Location: [facility] OR;  Service: Orthopedics;  Laterality: Left;   SUBMUCOSAL LIFTING INJECTION  06/22/2022   Procedure: SUBMUCOSAL LIFTING INJECTION;  Surgeon: Wilhelmenia Aloha Raddle., MD;  Location: [facility] ENDOSCOPY;  Service: Gastroenterology;;   SUBMUCOSAL TATTOO INJECTION  06/22/2022   Procedure: SUBMUCOSAL TATTOO INJECTION;  Surgeon: Wilhelmenia Aloha Raddle., MD;  Location: [name] ENDOSCOPY;  Service: Gastroenterology;;   TRANSURETHRAL RESECTION OF PROSTATE N/A 06/28/2020   Procedure: TRANSURETHRAL RESECTION OF THE PROSTATE (TURP);  Surgeon: Matilda Senior, MD;  Location: [facility] OR;  Service: Urology;  Laterality: N/A;   VENOUS ANGIOPLASTY  11/07/2023   Procedure: VENOUS ANGIOPLASTY;  Surgeon: Gretta Lonni PARAS, MD;  Location: [facility] PV LAB;  Service: Cardiovascular;;  Basilic Vein, Subclavian Vein   WISDOM TOOTH EXTRACTION     Social History   Occupational History   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

## 2024-02-17 ENCOUNTER — Encounter: Payer: Self-pay | Admitting: Orthopedic Surgery

## 2024-02-17 ENCOUNTER — Telehealth: Payer: Self-pay | Admitting: Orthopedic Surgery

## 2024-02-17 NOTE — Telephone Encounter (Signed)
 Pt called asking for results of testing he had yesterday. Please call pt at [phone].

## 2024-02-18 ENCOUNTER — Telehealth: Payer: Self-pay | Admitting: Orthopedic Surgery

## 2024-02-18 ENCOUNTER — Encounter: Payer: Self-pay | Admitting: Internal Medicine

## 2024-02-18 ENCOUNTER — Ambulatory Visit (INDEPENDENT_AMBULATORY_CARE_PROVIDER_SITE_OTHER): Admitting: Internal Medicine

## 2024-02-18 ENCOUNTER — Other Ambulatory Visit: Payer: Self-pay

## 2024-02-18 VITALS — BP 129/60 | HR 60

## 2024-02-18 DIAGNOSIS — M71161 Other infective bursitis, right knee: Secondary | ICD-10-CM | POA: Diagnosis not present

## 2024-02-18 DIAGNOSIS — M25561 Pain in right knee: Secondary | ICD-10-CM | POA: Diagnosis present

## 2024-02-18 DIAGNOSIS — N186 End stage renal disease: Secondary | ICD-10-CM

## 2024-02-18 DIAGNOSIS — L03115 Cellulitis of right lower limb: Secondary | ICD-10-CM | POA: Diagnosis not present

## 2024-02-18 DIAGNOSIS — L270 Generalized skin eruption due to drugs and medicaments taken internally: Secondary | ICD-10-CM

## 2024-02-18 DIAGNOSIS — Z992 Dependence on renal dialysis: Secondary | ICD-10-CM | POA: Diagnosis not present

## 2024-02-18 NOTE — Progress Notes (Signed)
 Patient: [name]  DOB: 1950/09/02 MRN: [id number] PCP: [name], NP    Chief Complaint  Patient presents with   Follow-up     Patient Active Problem List   Diagnosis Date Noted   SIRS (systemic inflammatory response syndrome) (HCC) 01/08/2024   Hypotension 01/08/2024   (HFpEF) heart failure with preserved ejection fraction (HCC) 01/08/2024   Right knee pain 01/08/2024   Gastroesophageal reflux disease without esophagitis 12/22/2023   CAP (community acquired pneumonia) 10/29/2023   Acute respiratory failure with hypoxia (HCC) 05/05/2023   Ascites 05/05/2023   Hyponatremia 05/05/2023   Right below-knee amputee (HCC) 07/29/2022   Sepsis due to pneumonia (HCC) 07/23/2022   Foot infection 07/22/2022   Acute respiratory failure (HCC) 07/22/2022   Anemia due to chronic blood loss 06/23/2022   Heme positive stool 06/23/2022   Benign neoplasm of cecum 06/23/2022   Benign neoplasm of transverse colon 06/23/2022   Benign neoplasm of descending colon 06/23/2022   Abscess of right foot 05/17/2022   Contraindication to anticoagulation therapy GU Bleed 03/13/2022   Permanent atrial fibrillation (HCC) 08/18/2021   Anemia of chronic renal failure 08/18/2021   Thrombocytopenia 08/18/2021   Encephalopathy 08/18/2021   Action induced myoclonus 08/18/2021   Left below-knee amputee (HCC) 05/11/2021   Anemia of chronic disease 09/13/2020   COVID-19 virus infection 09/13/2020   Pressure injury of skin 06/29/2020   Gross hematuria 06/16/2020   Typical atrial flutter (HCC) 03/24/2020   Bilateral pleural effusion 02/24/2020   Healthcare maintenance 01/28/2020   Shortness of breath 01/28/2020   History of partial ray amputation of fourth toe of right foot 09/08/2019   Chronic cough 06/25/2019   Cutaneous abscess of right foot    Subacute osteomyelitis, right ankle and foot (HCC) 12/14/2018   AKI (acute kidney injury) 12/14/2018   ESRD on dialysis (HCC) 12/14/2018   S/P  CABG (coronary artery bypass graft) 12/11/2018   Gait abnormality 08/24/2018   Diabetic neuropathy (HCC) 02/06/2018   Cervical myelopathy (HCC) 02/06/2018   Onychomycosis 10/30/2017   Other spondylosis with radiculopathy, cervical region 01/27/2017   Midfoot ulcer, right, limited to breakdown of skin (HCC) 11/15/2016   Lateral epicondylitis, left elbow 08/12/2016   Prostate cancer (HCC) 06/19/2016   Cellulitis of right foot 12/24/2015   Gout 09/14/2012   Essential hypertension 09/14/2012   Hypothyroidism 09/14/2012   CAD (coronary artery disease) 09/14/2012   Insulin  dependent type 2 diabetes mellitus (HCC) 09/14/2012   Hyperlipidemia associated with type 2 diabetes mellitus (HCC)    Neuropathy (HCC)      Subjective:  [name] is a 73 y.o. M with past medical history of ESRD on HD, PAF not on AC due to GI bleed, chronic anemia, CAD status post CABG 2005, diabetes mellitus, PAD s/p bilateral BKA, recurrent ascites with frequent paracentesis presented on [place] (knee pain.  ID was engaged given concerns for right knee infection.  Found to have right knee septic bursitis.  Orthopedics was engaged patient underwent bedside aspiration with 2 cc of purulent fluid aspirated from the prepatellar septic bursa.  Ortho did not think patient is surgical management.  Cultures grew MSSA from aspiration..  Patient was discharged on 4 weeks antibiotics cefazolin  with HD.  There are some concern for rash to cefazolin /transferred from to vancomycin .  Patient tolerating antibiotics.  He states that right knee is still feels red but he has had increased pressure on it.   Review of Systems  All other systems reviewed  and are negative.   Past Medical History:  Diagnosis Date   Acute osteomyelitis of metatarsal bone of left foot (HCC)    Ambulates with cane    straight cane   Anemia    Cervical myelopathy (HCC) 02/06/2018   Chronic kidney disease    dailysis M W F- home   Community acquired pneumonia  of left lower lobe of lung 08/18/2021   Complication of anesthesia    Coronary artery disease    Dehiscence of amputation stump of left lower extremity (HCC)    Diabetes mellitus without complication (HCC)    type2   Diabetic foot ulcer (HCC)    Diabetic neuropathy (HCC) 02/06/2018   Dysrhythmia    Gait abnormality 08/24/2018   GERD (gastroesophageal reflux disease)     01/06/20- not current   Gout    History of blood transfusion    History of blood transfusion    History of kidney stones    passed stones   Hypercholesteremia    Hypertension    Hypothyroidism    Neuromuscular disorder (HCC)    neuropathy left leg and bilateral feet   Neuropathy    Partial nontraumatic amputation of foot, 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   PONV (postoperative nausea and vomiting)    Prostate cancer (HCC)    PVD (peripheral vascular disease)    with amputations    Outpatient Medications Prior to Visit  Medication Sig Dispense Refill   allopurinol  (ZYLOPRIM ) 100 MG tablet Take 2 tablets (200 mg total) by mouth daily. 30 tablet 0   levothyroxine  (SYNTHROID ) 112 MCG tablet Take 112 mcg by mouth daily before breakfast.     modafinil  (PROVIGIL ) 200 MG tablet Take 1 tablet (200 mg total) by mouth daily. 90 tablet 0   pantoprazole  (PROTONIX ) 40 MG tablet Take 1 tablet (40 mg total) by mouth daily. 90 tablet 2   pregabalin  (LYRICA ) 75 MG capsule Take 1 capsule (75 mg total) by mouth daily. 30 capsule 0   rosuvastatin  (CRESTOR ) 10 MG tablet Take 1 tablet (10 mg total) by mouth daily. 90 tablet 1   sevelamer  carbonate (RENVELA ) 800 MG tablet Take 800 mg by mouth 3 (three) times daily.     No facility-administered medications prior to visit.     Allergies  Allergen Reactions   Cefazolin  Dermatitis   Morphine  Other (See Comments)    Other reaction(s): Delusions (intolerance)  Other Reaction(s): delusions   Mushroom Extract Complex (Obsolete) Nausea Only    Social History   Tobacco Use   Smoking status:  Former    Types: Cigars    Start date: 04/08/1973    Quit date: 12/24/1988    Years since quitting: 35.1   Smokeless tobacco: Never   Tobacco comments:    Cigars and Pipe   Vaping Use   Vaping status: Never Used  Substance Use Topics   Alcohol  use: Never   Drug use: Never    Family History  Problem Relation Age of Onset   Diabetes Mellitus II Mother    Kidney disease Mother    Diabetes Mellitus II Father    CAD Father    Cancer Father        prostate   Kidney disease Father    Diabetes Mellitus II Brother    Kidney disease Brother    Diabetes Mellitus II Brother    Stomach cancer Brother 29   Kidney disease Brother    Colon cancer Neg Hx    Colon polyps Neg Hx  Esophageal cancer Neg Hx    Rectal cancer Neg Hx    Pancreatic cancer Neg Hx     Objective:   Vitals:   02/18/24 1449  BP: 129/60  Pulse: 60  SpO2: 95%   There is no height or weight on file to calculate BMI.  Physical Exam Constitutional:      General: He is not in acute distress.    Appearance: He is normal weight. He is not toxic-appearing.  HENT:     Head: Normocephalic and atraumatic.     Right Ear: External ear normal.     Left Ear: External ear normal.     Nose: No congestion or rhinorrhea.     Mouth/Throat:     Mouth: Mucous membranes are moist.     Pharynx: Oropharynx is clear.  Eyes:     Extraocular Movements: Extraocular movements intact.     Conjunctiva/sclera: Conjunctivae normal.     Pupils: Pupils are equal, round, and reactive to light.  Cardiovascular:     Rate and Rhythm: Normal rate and regular rhythm.     Heart sounds: No murmur heard.    No friction rub. No gallop.  Pulmonary:     Effort: Pulmonary effort is normal.     Breath sounds: Normal breath sounds.  Abdominal:     General: Abdomen is flat. Bowel sounds are normal.     Palpations: Abdomen is soft.  Musculoskeletal:     Cervical back: Normal range of motion and neck supple.  Skin:    General: Skin is warm and  dry.  Neurological:     General: No focal deficit present.     Mental Status: He is oriented to person, place, and time.  Psychiatric:        Mood and Affect: Mood normal.        Lab Results: Lab Results  Component Value Date   WBC 11.8 (H) 01/10/2024   HGB 10.6 (L) 01/10/2024   HCT 33.3 (L) 01/10/2024   MCV 87.6 01/10/2024   PLT 127 (L) 01/10/2024    Lab Results  Component Value Date   CREATININE 6.90 (H) 01/10/2024   BUN 54 (H) 01/10/2024   NA 137 01/10/2024   K 5.1 01/10/2024   CL 98 01/10/2024   CO2 22 01/10/2024    Lab Results  Component Value Date   ALT 12 01/08/2024   AST 22 01/08/2024   ALKPHOS 165 (H) 01/08/2024   BILITOT 1.0 01/08/2024     Assessment & Plan:  #Septic bursitis with cellulitis of right knee #Rash with cefazolin  #ESRD with HD - Patient was seen by orthopedics during hospitalization knee was aspirated cultures grew MSSA on 10/5.  Patient on ceftriaxone  then transition cefazolin  to complete 4 weeks of antibiotics.  On 10/14 patient developed rash with cefazolin  was switched to vancomycin  with HD -Patient states that overall he feels better.  He states right lower extremity stump is slightly erythematous but he thinks that is due to pressure.  Did not look actively infected.  As we do not have imaging right lower extremity and patient care concerned with stump erythema will get x-ray. - At last visit on 10/29 vancomycin  extended another 2 weeks.  Erythema at stump significantly improved/resolved .  X-ray of right knee showed soft tissue thickening distal stump without soft tissue emphysema with possible resorptive changes at margins at tib-fib.  Patient has received 6 weeks of antibiotic as/if there was osteo and has been treated Plan: - Follow-up in  ID in 2 months to monitor off of antibiotic - Labs today -Continue follow-up Ortho    #lle with irritation -Seen by Ortho and was consulted to wrap left lower extremity.  He states that when he  wraps his stump it does not fit in the prosthesis.  He has irritation due to his rubbing on it. - Counseled to follow-up with orthopedics   0referrl to wound care Loney Stank, MD [facility]   02/18/24  3:15 PM I personally spent a total of 76 minutes in the care of the patient today including preparing to see the patient, getting/reviewing separately obtained history, performing a medically appropriate exam/evaluation, counseling and educating, placing orders, documenting clinical information in the EHR, independently interpreting results, and communicating results.

## 2024-02-18 NOTE — Telephone Encounter (Signed)
 This is a duplicate.

## 2024-02-18 NOTE — Telephone Encounter (Signed)
Pt informed of information below

## 2024-02-18 NOTE — Telephone Encounter (Signed)
 Patient called. He would like results from his test. Would like Autumn to call.

## 2024-02-19 LAB — CBC WITH DIFFERENTIAL/PLATELET
Absolute Lymphocytes: 445 {cells}/uL — ABNORMAL LOW (ref 850–3900)
Absolute Monocytes: 506 {cells}/uL (ref 200–950)
Basophils Absolute: 92 {cells}/uL (ref 0–200)
Basophils Relative: 1.5 %
Eosinophils Absolute: 256 {cells}/uL (ref 15–500)
Eosinophils Relative: 4.2 %
HCT: 34.3 % — ABNORMAL LOW (ref 38.5–50.0)
Hemoglobin: 10.8 g/dL — ABNORMAL LOW (ref 13.2–17.1)
MCH: 28.7 pg (ref 27.0–33.0)
MCHC: 31.5 g/dL — ABNORMAL LOW (ref 32.0–36.0)
MCV: 91.2 fL (ref 80.0–100.0)
MPV: 11.3 fL (ref 7.5–12.5)
Monocytes Relative: 8.3 %
Neutro Abs: 4801 {cells}/uL (ref 1500–7800)
Neutrophils Relative %: 78.7 %
Platelets: 139 Thousand/uL — ABNORMAL LOW (ref 140–400)
RBC: 3.76 Million/uL — ABNORMAL LOW (ref 4.20–5.80)
RDW: 14.8 % (ref 11.0–15.0)
Total Lymphocyte: 7.3 %
WBC: 6.1 Thousand/uL (ref 3.8–10.8)

## 2024-02-19 LAB — SEDIMENTATION RATE: Sed Rate: 28 mm/h — ABNORMAL HIGH (ref 0–20)

## 2024-02-19 LAB — C-REACTIVE PROTEIN: CRP: 24.8 mg/L — ABNORMAL HIGH (ref <8.0)

## 2024-02-20 ENCOUNTER — Telehealth: Payer: Self-pay | Admitting: Orthopedic Surgery

## 2024-02-20 ENCOUNTER — Ambulatory Visit (HOSPITAL_COMMUNITY)
Admission: RE | Admit: 2024-02-20 | Discharge: 2024-02-20 | Disposition: A | Discharge status: Home / Self Care | Source: Ambulatory Visit | Attending: Nephrology | Admitting: Nephrology

## 2024-02-20 DIAGNOSIS — R188 Other ascites: Secondary | ICD-10-CM | POA: Insufficient documentation

## 2024-02-20 DIAGNOSIS — N186 End stage renal disease: Secondary | ICD-10-CM | POA: Insufficient documentation

## 2024-02-20 HISTORY — PX: IR PARACENTESIS: IMG2679

## 2024-02-20 MED ORDER — LIDOCAINE-EPINEPHRINE 1 %-1:100000 IJ SOLN
INTRAMUSCULAR | Status: AC
  Filled 2024-02-20: qty 1

## 2024-02-20 MED ORDER — LIDOCAINE HCL 1 % IJ SOLN
INTRAMUSCULAR | Status: AC
  Filled 2024-02-20: qty 20

## 2024-02-20 MED ORDER — LIDOCAINE HCL 1 % IJ SOLN
20.0000 mL | Freq: Once | INTRAMUSCULAR | Status: AC
  Administered 2024-02-20: 10 mL

## 2024-02-20 NOTE — Procedures (Signed)
 PROCEDURE SUMMARY:  Successful US  guided paracentesis from LLQ.  Yielded 7.1 L of clear yellow fluid.  No immediate complications.  Pt tolerated well.   Specimen not sent for labs.  EBL < 2 mL  Warren JONELLE Dais, NP 02/20/2024 3:09 PM

## 2024-02-20 NOTE — Telephone Encounter (Signed)
 Patient called. Says the wound care center can not see him until 12/8. Would like Autumn to call him.

## 2024-02-23 ENCOUNTER — Telehealth: Payer: Self-pay | Admitting: Orthopedic Surgery

## 2024-02-23 NOTE — Telephone Encounter (Signed)
 Called pt and advised ok to come in at 3:30 tomorrow

## 2024-02-23 NOTE — Telephone Encounter (Signed)
 Called pt and appt sch for tomorrow at 1:45

## 2024-02-23 NOTE — Telephone Encounter (Signed)
 Pt called saying that the appt tomorrow at 1:45 doesn't work for his wife and is wondering of they could do something closer to 4:00. Call back number is [phone]

## 2024-02-24 ENCOUNTER — Ambulatory Visit: Admitting: Physician Assistant

## 2024-02-24 ENCOUNTER — Encounter: Admitting: Family Medicine

## 2024-02-24 ENCOUNTER — Encounter: Payer: Self-pay | Admitting: Physician Assistant

## 2024-02-24 DIAGNOSIS — T8789 Other complications of amputation stump: Secondary | ICD-10-CM | POA: Diagnosis not present

## 2024-02-24 DIAGNOSIS — L97921 Non-pressure chronic ulcer of unspecified part of left lower leg limited to breakdown of skin: Secondary | ICD-10-CM

## 2024-02-24 NOTE — Progress Notes (Signed)
 Office Visit Note   Patient: [name]           Date of Birth: 02/26/1951           MRN: [id number] Visit Date: 02/24/2024              Requested by: [name], NP [address] [place],  [place] [address] PCP: [name], NP  Chief Complaint  Patient presents with   Right Knee - Follow-up      HPI: 73 y/o male who has left BKA with edema and weeping clear fluid.  He has ESRD with fluid over load.  He states he just sits in his WC all day with his legs hanging down.  He is so disappointed that he can't wear his left prosthesis right now.    Assessment & Plan: Visit Diagnoses: No diagnosis found.  Plan: We discussed that his blood flow is intact. He really needs to lay down in the bed and elevate the stump to decrease the edema.  Decreasing the edema will allow the wounds to heal and th stump to shrink so he can don his prothesis.  I gave him a handout about elevation above the heart and wrote instruction to do this for 20 mins. A day at least 3 times a day.  If he falls asleep in the bed it is better than in the [facility] with his stump in a dependent position all day.  He agreed to try this.  We wrapped the stump in guaze with ace wrap compression and a stump sock.  He will look for his Vive stump shrinker sock at home to use.    Follow-Up Instructions: Return in about 2 weeks (around 03/09/2024).   Ortho Exam  Patient is alert, oriented, no adenopathy, well-dressed, normal affect, normal respiratory effort. He has weeping, multiple small superficial open,  edema of the stump.  Cool to touch.       +----------+--------+-----+--------+--------+------------------------------  ---+  LEFT     PSV cm/sRatioStenosisWaveformComments                            +----------+--------+-----+--------+--------+------------------------------  ---+  CFA Distal74                   biphasic                                     +----------+--------+-----+--------+--------+------------------------------  ---+  DFA      83                   biphasic                                    +----------+--------+-----+--------+--------+------------------------------  ---+  SFA Prox  57                   biphasic                                    +----------+--------+-----+--------+--------+------------------------------  ---+  SFA Mid   49                   biphasic                                    +----------+--------+-----+--------+--------+------------------------------  ---+  SFA Distal61                   biphasic                                    +----------+--------+-----+--------+--------+------------------------------  ---+  POP Prox  27                   biphasic                                    +----------+--------+-----+--------+--------+------------------------------  ---+  POP Distal                             NWV due to pain tolerance and                                              wounds                              +----------+--------+-----+--------+--------+------------------------------  ---+     Summary:  Left: Patent lower extremity without obvious evidence of stenosis.         Imaging: No results found. No images are attached to the encounter.  Labs: Lab Results  Component Value Date   HGBA1C 4.9 10/29/2023   HGBA1C 5.0 05/17/2022   HGBA1C 5.5 05/04/2021   ESRSEDRATE 28 (H) 02/18/2024   ESRSEDRATE 34 (H) 01/24/2020   ESRSEDRATE 41 (H) 01/10/2020   CRP 24.8 (H) 02/18/2024   CRP 8.8 (H) 01/24/2020   CRP 35.9 (H) 01/10/2020   LABURIC 4.2 01/09/2024   REPTSTATUS 01/14/2024 FINAL 01/11/2024   GRAMSTAIN  01/11/2024    FEW WBC PRESENT, PREDOMINANTLY PMN MODERATE GRAM POSITIVE COCCI Performed at [facility], [address], [place], [place] [address]    CULT ABUNDANT STAPHYLOCOCCUS AUREUS 01/11/2024    LABORGA STAPHYLOCOCCUS AUREUS 01/11/2024     Lab Results  Component Value Date   ALBUMIN  2.0 (L) 01/10/2024   ALBUMIN  1.7 (L) 01/08/2024   ALBUMIN  2.3 (L) 01/08/2024   PREALBUMIN 32.7 05/09/2021   PREALBUMIN 14.8 (L) 12/14/2018    Lab Results  Component Value Date   MG 2.0 01/15/2024   MG 2.0 01/13/2024   MG 2.2 01/12/2024   Lab Results  Component Value Date   VD25OH 42.0 11/19/2021   VD25OH 16.13 (L) 05/09/2021    Lab Results  Component Value Date   PREALBUMIN 32.7 05/09/2021   PREALBUMIN 14.8 (L) 12/14/2018      Latest Ref Rng & Units 02/18/2024    4:10 PM 01/10/2024    2:58 AM 01/08/2024    5:41 AM  CBC EXTENDED  WBC 3.8 - 10.8 Thousand/uL 6.1  11.8  15.1   RBC 4.20 - 5.80 Million/uL 3.76  3.80  3.74   Hemoglobin 13.2 - 17.1 g/dL 89.1  89.3  89.4   HCT 38.5 - 50.0 % 34.3  33.3  34.2   Platelets 140 - 400 Thousand/uL 139  127  141   NEUT# 1,500 - 7,800 cells/uL 4,801  10.1  13.5   Lymph# 0.7 - 4.0 K/uL  0.4  0.3  There is no height or weight on file to calculate BMI.  Orders:  No orders of the defined types were placed in this encounter.  No orders of the defined types were placed in this encounter.    Procedures: No procedures performed  Clinical Data: No additional findings.  ROS:  All other systems negative, except as noted in the HPI. Review of Systems  Objective: Vital Signs: There were no vitals taken for this visit.  Specialty Comments:  No specialty comments available.  PMFS History: Patient Active Problem List   Diagnosis Date Noted   SIRS (systemic inflammatory response syndrome) (HCC) 01/08/2024   Hypotension 01/08/2024   (HFpEF) heart failure with preserved ejection fraction (HCC) 01/08/2024   Right knee pain 01/08/2024   Gastroesophageal reflux disease without esophagitis 12/22/2023   CAP (community acquired pneumonia) 10/29/2023   Acute respiratory failure with hypoxia (HCC) 05/05/2023   Ascites 05/05/2023    Hyponatremia 05/05/2023   Right below-knee amputee (HCC) 07/29/2022   Sepsis due to pneumonia (HCC) 07/23/2022   Foot infection 07/22/2022   Acute respiratory failure (HCC) 07/22/2022   Anemia due to chronic blood loss 06/23/2022   Heme positive stool 06/23/2022   Benign neoplasm of cecum 06/23/2022   Benign neoplasm of transverse colon 06/23/2022   Benign neoplasm of descending colon 06/23/2022   Abscess of right foot 05/17/2022   Contraindication to anticoagulation therapy GU Bleed 03/13/2022   Permanent atrial fibrillation (HCC) 08/18/2021   Anemia of chronic renal failure 08/18/2021   Thrombocytopenia 08/18/2021   Encephalopathy 08/18/2021   Action induced myoclonus 08/18/2021   Left below-knee amputee (HCC) 05/11/2021   Anemia of chronic disease 09/13/2020   COVID-19 virus infection 09/13/2020   Pressure injury of skin 06/29/2020   Gross hematuria 06/16/2020   Typical atrial flutter (HCC) 03/24/2020   Bilateral pleural effusion 02/24/2020   Healthcare maintenance 01/28/2020   Shortness of breath 01/28/2020   History of partial ray amputation of fourth toe of right foot 09/08/2019   Chronic cough 06/25/2019   Cutaneous abscess of right foot    Subacute osteomyelitis, right ankle and foot (HCC) 12/14/2018   AKI (acute kidney injury) 12/14/2018   ESRD on dialysis (HCC) 12/14/2018   S/P CABG (coronary artery bypass graft) 12/11/2018   Gait abnormality 08/24/2018   Diabetic neuropathy (HCC) 02/06/2018   Cervical myelopathy (HCC) 02/06/2018   Onychomycosis 10/30/2017   Other spondylosis with radiculopathy, cervical region 01/27/2017   Midfoot ulcer, right, limited to breakdown of skin (HCC) 11/15/2016   Lateral epicondylitis, left elbow 08/12/2016   Prostate cancer (HCC) 06/19/2016   Cellulitis of right foot 12/24/2015   Gout 09/14/2012   Essential hypertension 09/14/2012   Hypothyroidism 09/14/2012   CAD (coronary artery disease) 09/14/2012   Insulin  dependent type 2  diabetes mellitus (HCC) 09/14/2012   Hyperlipidemia associated with type 2 diabetes mellitus (HCC)    Neuropathy (HCC)    Past Medical History:  Diagnosis Date   Acute osteomyelitis of metatarsal bone of left foot (HCC)    Ambulates with cane    straight cane   Anemia    Cervical myelopathy (HCC) 02/06/2018   Chronic kidney disease    dailysis [facility]- home   Community acquired pneumonia of left lower lobe of lung 08/18/2021   Complication of anesthesia    Coronary artery disease    Dehiscence of amputation stump of left lower extremity (HCC)    Diabetes mellitus without complication (HCC)    type2   Diabetic foot  ulcer (HCC)    Diabetic neuropathy (HCC) 02/06/2018   Dysrhythmia    Gait abnormality 08/24/2018   GERD (gastroesophageal reflux disease)     01/06/20- not current   Gout    History of blood transfusion    History of blood transfusion    History of kidney stones    passed stones   Hypercholesteremia    Hypertension    Hypothyroidism    Neuromuscular disorder (HCC)    neuropathy left leg and bilateral feet   Neuropathy    Partial nontraumatic amputation of foot, 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   PONV (postoperative nausea and vomiting)    Prostate cancer (HCC)    PVD (peripheral vascular disease)    with amputations    Family History  Problem Relation Age of Onset   Diabetes Mellitus II Mother    Kidney disease Mother    Diabetes Mellitus II Father    CAD Father    Cancer Father        prostate   Kidney disease Father    Diabetes Mellitus II Brother    Kidney disease Brother    Diabetes Mellitus II Brother    Stomach cancer Brother 27   Kidney disease Brother    Colon cancer Neg Hx    Colon polyps Neg Hx    Esophageal cancer Neg Hx    Rectal cancer Neg Hx    Pancreatic cancer Neg Hx     Past Surgical History:  Procedure Laterality Date   A-FLUTTER ABLATION N/A 04/06/2020   Procedure: A-FLUTTER ABLATION;  Surgeon: Waddell Danelle ORN, MD;  Location: [facility] INVASIVE  CV LAB;  Service: Cardiovascular;  Laterality: N/A;   A/V FISTULAGRAM N/A 03/05/2023   Procedure: A/V Fistulagram;  Surgeon: Pearline [name] RAMAN, MD;  Location: [facility] INVASIVE CV LAB;  Service: Vascular;  Laterality: N/A;   A/V SHUNT INTERVENTION Left 11/07/2023   Procedure: A/V SHUNT INTERVENTION;  Surgeon: Gretta Lonni PARAS, MD;  Location: [facility] PV LAB;  Service: Cardiovascular;  Laterality: Left;   AMPUTATION Left 12/25/2013   Procedure: AMPUTATION RAY LEFT 5TH RAY;  Surgeon: Jerona Harden GAILS, MD;  Location: [facility] ORS;  Service: Orthopedics;  Laterality: Left;   AMPUTATION Right 12/15/2018   Procedure: AMPUTATION OF 4TH AND 5TH TOES RIGHT FOOT;  Surgeon: Harden Jerona GAILS, MD;  Location: [facility] OR;  Service: Orthopedics;  Laterality: Right;   AMPUTATION Left 02/02/2021   Procedure: LEFT FOOT 4TH RAY AMPUTATION;  Surgeon: Harden Jerona GAILS, MD;  Location: [facility] OR;  Service: Orthopedics;  Laterality: Left;   AMPUTATION Left 05/09/2021   Procedure: LEFT BELOW KNEE AMPUTATION;  Surgeon: Harden Jerona GAILS, MD;  Location: [facility] OR;  Service: Orthopedics;  Laterality: Left;   AMPUTATION Right 07/26/2022   Procedure: RIGHT BELOW KNEE AMPUTATION;  Surgeon: Harden Jerona GAILS, MD;  Location: [facility];  Service: Orthopedics;  Laterality: Right;   APPLICATION OF WOUND VAC Right 12/15/2018   Procedure: APPLICATION OF WOUND VAC;  Surgeon: Harden Jerona GAILS, MD;  Location: [facility] OR;  Service: Orthopedics;  Laterality: Right;   AV FISTULA PLACEMENT Left 03/25/2019   Procedure: LEFT ARM ARTERIOVENOUS (AV) FISTULA CREATION;  Surgeon: Serene Gaile ORN, MD;  Location: [facility] OR;  Service: Vascular;  Laterality: Left;   BACK SURGERY     BASCILIC VEIN TRANSPOSITION Left 05/20/2019   Procedure: SECOND STAGE LEFT BASCILIC VEIN TRANSPOSITION;  Surgeon: Serene Gaile ORN, MD;  Location: [facility] OR;  Service: Vascular;  Laterality: Left;   BIOPSY  06/22/2022   Procedure:  BIOPSY;  Surgeon: Wilhelmenia Aloha Raddle., MD;  Location: [facility] ENDOSCOPY;  Service: Gastroenterology;;    CARDIAC CATHETERIZATION  02/17/2014   CATARACT EXTRACTION Bilateral    CHOLECYSTECTOMY     COLONOSCOPY  2011   in [place], [place]   COLONOSCOPY N/A 06/23/2022   Procedure: COLONOSCOPY;  Surgeon: Legrand Victory LITTIE DOUGLAS, MD;  Location: [facility] ENDOSCOPY;  Service: Gastroenterology;  Laterality: N/A;   CORONARY ARTERY BYPASS GRAFT  2008   CYSTOSCOPY N/A 06/29/2020   Procedure: CYSTOSCOPY WITH CLOT EVACUATION AND  FULGERATION;  Surgeon: Matilda Senior, MD;  Location: [facility] OR;  Service: Urology;  Laterality: N/A;   CYSTOSCOPY WITH FULGERATION Bilateral 06/17/2020   Procedure: CYSTOSCOPY,BILATERAL RETROGRADE, CLOT EVACUATION WITH FULGERATION OF THE BLADDER;  Surgeon: Selma Donnice SAUNDERS, MD;  Location: [facility] OR;  Service: Urology;  Laterality: Bilateral;   CYSTOSCOPY WITH FULGERATION N/A 06/28/2020   Procedure: CYSTOSCOPY WITH CLOT EVACUATION AND FULGERATION OF BLEEDERS;  Surgeon: Matilda Senior, MD;  Location: [facility];  Service: Urology;  Laterality: N/A;  1 HR   ENTEROSCOPY N/A 06/22/2022   Procedure: ENTEROSCOPY;  Surgeon: Wilhelmenia Aloha Raddle., MD;  Location: [facility] ENDOSCOPY;  Service: Gastroenterology;  Laterality: N/A;   HEMOSTASIS CLIP PLACEMENT  06/22/2022   Procedure: HEMOSTASIS CLIP PLACEMENT;  Surgeon: Wilhelmenia Aloha Raddle., MD;  Location: [facility] ENDOSCOPY;  Service: Gastroenterology;;   HOT HEMOSTASIS N/A 06/22/2022   Procedure: HOT HEMOSTASIS (ARGON PLASMA COAGULATION/BICAP);  Surgeon: Wilhelmenia Aloha Raddle., MD;  Location: [name] ENDOSCOPY;  Service: Gastroenterology;  Laterality: N/A;   I & D EXTREMITY Right 12/15/2018   Procedure: DEBRIDEMENT RIGHT FOOT;  Surgeon: Harden Jerona GAILS, MD;  Location: [facility] OR;  Service: Orthopedics;  Laterality: Right;   I & D EXTREMITY Right 08/27/2019   Procedure: PARTIAL CUBOID EXCISION RIGHT FOOT;  Surgeon: Harden Jerona GAILS, MD;  Location: [facility] OR;  Service: Orthopedics;  Laterality: Right;   I & D EXTREMITY Right 01/07/2020   Procedure: RIGHT FOOT EXCISION INFECTED  BONE;  Surgeon: Harden Jerona GAILS, MD;  Location: [facility] OR;  Service: Orthopedics;  Laterality: Right;   I & D EXTREMITY Right 05/17/2022   Procedure: IRRIGATION AND DEBRIDEMENT RIGHT FOOT ABSCESS;  Surgeon: Harden Jerona GAILS, MD;  Location: [facility] OR;  Service: Orthopedics;  Laterality: Right;   IR PARACENTESIS  10/11/2022   IR PARACENTESIS  12/24/2022   IR PARACENTESIS  02/06/2023   IR PARACENTESIS  05/05/2023   IR PARACENTESIS  05/19/2023   IR PARACENTESIS  07/11/2023   IR PARACENTESIS  07/30/2023   IR PARACENTESIS  08/20/2023   IR PARACENTESIS  09/19/2023   IR PARACENTESIS  10/03/2023   IR PARACENTESIS  10/17/2023   IR PARACENTESIS  10/30/2023   IR PARACENTESIS  11/19/2023   IR PARACENTESIS  12/03/2023   IR PARACENTESIS  12/16/2023   IR PARACENTESIS  01/12/2024   IR PARACENTESIS  02/06/2024   IR PARACENTESIS  02/20/2024   NECK SURGERY     novemver 2019   PERIPHERAL VASCULAR BALLOON ANGIOPLASTY Left 03/05/2023   Procedure: PERIPHERAL VASCULAR BALLOON ANGIOPLASTY;  Surgeon: Pearline [name] RAMAN, MD;  Location: [facility] INVASIVE CV LAB;  Service: Vascular;  Laterality: Left;  AVF   POLYPECTOMY  06/22/2022   Procedure: POLYPECTOMY;  Surgeon: Mansouraty, Aloha Raddle., MD;  Location: [facility] ENDOSCOPY;  Service: Gastroenterology;;   POLYPECTOMY  06/23/2022   Procedure: POLYPECTOMY;  Surgeon: Legrand Victory LITTIE DOUGLAS, MD;  Location: [facility] ENDOSCOPY;  Service: Gastroenterology;;   STUMP REVISION Left 06/27/2021   Procedure: REVISION LEFT BELOW KNEE AMPUTATION;  Surgeon: Harden Jerona GAILS, MD;  Location: [facility] OR;  Service: Orthopedics;  Laterality: Left;   SUBMUCOSAL LIFTING INJECTION  06/22/2022   Procedure: SUBMUCOSAL LIFTING INJECTION;  Surgeon: Wilhelmenia Aloha Raddle., MD;  Location: [facility] ENDOSCOPY;  Service: Gastroenterology;;   SUBMUCOSAL TATTOO INJECTION  06/22/2022   Procedure: SUBMUCOSAL TATTOO INJECTION;  Surgeon: Wilhelmenia Aloha Raddle., MD;  Location: [facility] ENDOSCOPY;  Service: Gastroenterology;;   TRANSURETHRAL RESECTION OF  PROSTATE N/A 06/28/2020   Procedure: TRANSURETHRAL RESECTION OF THE PROSTATE (TURP);  Surgeon: Matilda Senior, MD;  Location: [facility] OR;  Service: Urology;  Laterality: N/A;   VENOUS ANGIOPLASTY  11/07/2023   Procedure: VENOUS ANGIOPLASTY;  Surgeon: Gretta Lonni PARAS, MD;  Location: [facility] PV LAB;  Service: Cardiovascular;;  Basilic Vein, Subclavian Vein   WISDOM TOOTH EXTRACTION     Social History   Occupational History   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

## 2024-03-01 ENCOUNTER — Other Ambulatory Visit (HOSPITAL_COMMUNITY): Payer: Self-pay | Admitting: Nephrology

## 2024-03-01 DIAGNOSIS — R188 Other ascites: Secondary | ICD-10-CM

## 2024-03-01 NOTE — Addendum Note (Signed)
 Encounter addended by: Ilah Leotis HERO on: 03/01/2024 8:41 AM  Actions taken: Imaging Exam begun

## 2024-03-01 NOTE — Addendum Note (Signed)
 Encounter addended by: Ilah Leotis HERO on: 03/01/2024 8:42 AM  Actions taken: Imaging Exam ended

## 2024-03-03 ENCOUNTER — Ambulatory Visit (HOSPITAL_COMMUNITY)
Admission: RE | Admit: 2024-03-03 | Discharge: 2024-03-03 | Disposition: A | Discharge status: Home / Self Care | Source: Ambulatory Visit | Attending: Nephrology | Admitting: Nephrology

## 2024-03-03 DIAGNOSIS — R188 Other ascites: Secondary | ICD-10-CM | POA: Insufficient documentation

## 2024-03-03 HISTORY — PX: IR PARACENTESIS: IMG2679

## 2024-03-03 MED ORDER — LIDOCAINE-EPINEPHRINE 1 %-1:100000 IJ SOLN
10.0000 mL | Freq: Once | INTRAMUSCULAR | Status: AC
  Administered 2024-03-03: 10 mL via INTRADERMAL

## 2024-03-03 MED ORDER — LIDOCAINE-EPINEPHRINE 1 %-1:100000 IJ SOLN
INTRAMUSCULAR | Status: AC
Start: 2024-03-03 — End: 2024-03-03
  Filled 2024-03-03: qty 1

## 2024-03-10 ENCOUNTER — Ambulatory Visit: Admitting: Family

## 2024-03-10 DIAGNOSIS — S88112S Complete traumatic amputation at level between knee and ankle, left lower leg, sequela: Secondary | ICD-10-CM

## 2024-03-10 DIAGNOSIS — Z89512 Acquired absence of left leg below knee: Secondary | ICD-10-CM

## 2024-03-11 ENCOUNTER — Encounter: Payer: Self-pay | Admitting: Family

## 2024-03-11 NOTE — Progress Notes (Signed)
 Office Visit Note   Patient: [name]           Date of Birth: Feb 08, 1951           MRN: [id number] Visit Date: 03/10/2024              Requested by: [name], NP [address] [name],  [place] [address] PCP: [name], NP  Chief Complaint  Patient presents with   Left Leg - Follow-up      HPI: The patient is a 47 gentleman who presents in follow-up for ongoing issues with drainage ulcers and pain to his left residual limb.  He has had difficulty donning his prosthetic due to volume increase.  He reports he was able to don his socket on the left today for about an hour  Assessment & Plan: Visit Diagnoses: No diagnosis found.  Plan: Encouraged him to continue with compression around-the-clock resume physical therapy as able.  They will begin using moisturizers to the left residual limb May continue with the liner liners.  They will continue close monitoring follow-up as needed  Follow-Up Instructions: No follow-ups on file.   Ortho Exam  Patient is alert, oriented, no adenopathy, well-dressed, ill-appearing, normal affect, normal respiratory effort. On examination left residual limb this is markedly improved there continues dry flaking skin mild erythema scattered fissures there is no drainage today no cellulitis no concern for infection.  Mild to moderate edema today    Imaging: No results found. No images are attached to the encounter.  Labs: Lab Results  Component Value Date   HGBA1C 4.9 10/29/2023   HGBA1C 5.0 05/17/2022   HGBA1C 5.5 05/04/2021   ESRSEDRATE 28 (H) 02/18/2024   ESRSEDRATE 34 (H) 01/24/2020   ESRSEDRATE 41 (H) 01/10/2020   CRP 24.8 (H) 02/18/2024   CRP 8.8 (H) 01/24/2020   CRP 35.9 (H) 01/10/2020   LABURIC 4.2 01/09/2024   REPTSTATUS 01/14/2024 FINAL 01/11/2024   GRAMSTAIN  01/11/2024    FEW WBC PRESENT, PREDOMINANTLY PMN MODERATE GRAM POSITIVE COCCI Performed at [facility], [address]  [place], [place] [address]    CULT ABUNDANT STAPHYLOCOCCUS AUREUS 01/11/2024   LABORGA STAPHYLOCOCCUS AUREUS 01/11/2024     Lab Results  Component Value Date   ALBUMIN  2.0 (L) 01/10/2024   ALBUMIN  1.7 (L) 01/08/2024   ALBUMIN  2.3 (L) 01/08/2024   PREALBUMIN 32.7 05/09/2021   PREALBUMIN 14.8 (L) 12/14/2018    Lab Results  Component Value Date   MG 2.0 01/15/2024   MG 2.0 01/13/2024   MG 2.2 01/12/2024   Lab Results  Component Value Date   VD25OH 42.0 11/19/2021   VD25OH 16.13 (L) 05/09/2021    Lab Results  Component Value Date   PREALBUMIN 32.7 05/09/2021   PREALBUMIN 14.8 (L) 12/14/2018      Latest Ref Rng & Units 02/18/2024    4:10 PM 01/10/2024    2:58 AM 01/08/2024    5:41 AM  CBC EXTENDED  WBC 3.8 - 10.8 Thousand/uL 6.1  11.8  15.1   RBC 4.20 - 5.80 Million/uL 3.76  3.80  3.74   Hemoglobin 13.2 - 17.1 g/dL 89.1  89.3  89.4   HCT 38.5 - 50.0 % 34.3  33.3  34.2   Platelets 140 - 400 Thousand/uL 139  127  141   NEUT# 1,500 - 7,800 cells/uL 4,801  10.1  13.5   Lymph# 0.7 - 4.0 K/uL  0.4  0.3  There is no height or weight on file to calculate BMI.  Orders:  No orders of the defined types were placed in this encounter.  No orders of the defined types were placed in this encounter.    Procedures: No procedures performed  Clinical Data: No additional findings.  ROS:  All other systems negative, except as noted in the HPI. Review of Systems  Objective: Vital Signs: There were no vitals taken for this visit.  Specialty Comments:  No specialty comments available.  PMFS History: Patient Active Problem List   Diagnosis Date Noted   SIRS (systemic inflammatory response syndrome) (HCC) 01/08/2024   Hypotension 01/08/2024   (HFpEF) heart failure with preserved ejection fraction (HCC) 01/08/2024   Right knee pain 01/08/2024   Gastroesophageal reflux disease without esophagitis 12/22/2023   CAP (community acquired pneumonia) 10/29/2023   Acute  respiratory failure with hypoxia (HCC) 05/05/2023   Ascites 05/05/2023   Hyponatremia 05/05/2023   Right below-knee amputee (HCC) 07/29/2022   Sepsis due to pneumonia (HCC) 07/23/2022   Foot infection 07/22/2022   Acute respiratory failure (HCC) 07/22/2022   Anemia due to chronic blood loss 06/23/2022   Heme positive stool 06/23/2022   Benign neoplasm of cecum 06/23/2022   Benign neoplasm of transverse colon 06/23/2022   Benign neoplasm of descending colon 06/23/2022   Abscess of right foot 05/17/2022   Contraindication to anticoagulation therapy GU Bleed 03/13/2022   Permanent atrial fibrillation (HCC) 08/18/2021   Anemia of chronic renal failure 08/18/2021   Thrombocytopenia 08/18/2021   Encephalopathy 08/18/2021   Action induced myoclonus 08/18/2021   Left below-knee amputee (HCC) 05/11/2021   Anemia of chronic disease 09/13/2020   COVID-19 virus infection 09/13/2020   Pressure injury of skin 06/29/2020   Gross hematuria 06/16/2020   Typical atrial flutter (HCC) 03/24/2020   Bilateral pleural effusion 02/24/2020   Healthcare maintenance 01/28/2020   Shortness of breath 01/28/2020   History of partial ray amputation of fourth toe of right foot 09/08/2019   Chronic cough 06/25/2019   Cutaneous abscess of right foot    Subacute osteomyelitis, right ankle and foot (HCC) 12/14/2018   AKI (acute kidney injury) 12/14/2018   ESRD on dialysis (HCC) 12/14/2018   S/P CABG (coronary artery bypass graft) 12/11/2018   Gait abnormality 08/24/2018   Diabetic neuropathy (HCC) 02/06/2018   Cervical myelopathy (HCC) 02/06/2018   Onychomycosis 10/30/2017   Other spondylosis with radiculopathy, cervical region 01/27/2017   Midfoot ulcer, right, limited to breakdown of skin (HCC) 11/15/2016   Lateral epicondylitis, left elbow 08/12/2016   Prostate cancer (HCC) 06/19/2016   Cellulitis of right foot 12/24/2015   Gout 09/14/2012   Essential hypertension 09/14/2012   Hypothyroidism 09/14/2012    CAD (coronary artery disease) 09/14/2012   Insulin  dependent type 2 diabetes mellitus (HCC) 09/14/2012   Hyperlipidemia associated with type 2 diabetes mellitus (HCC)    Neuropathy (HCC)    Past Medical History:  Diagnosis Date   Acute osteomyelitis of metatarsal bone of left foot (HCC)    Ambulates with cane    straight cane   Anemia    Cervical myelopathy (HCC) 02/06/2018   Chronic kidney disease    dailysis [facility]- home   Community acquired pneumonia of left lower lobe of lung 08/18/2021   Complication of anesthesia    Coronary artery disease    Dehiscence of amputation stump of left lower extremity (HCC)    Diabetes mellitus without complication (HCC)    type2   Diabetic foot  ulcer (HCC)    Diabetic neuropathy (HCC) 02/06/2018   Dysrhythmia    Gait abnormality 08/24/2018   GERD (gastroesophageal reflux disease)     01/06/20- not current   Gout    History of blood transfusion    History of blood transfusion    History of kidney stones    passed stones   Hypercholesteremia    Hypertension    Hypothyroidism    Neuromuscular disorder (HCC)    neuropathy left leg and bilateral feet   Neuropathy    Partial nontraumatic amputation of foot, 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   PONV (postoperative nausea and vomiting)    Prostate cancer (HCC)    PVD (peripheral vascular disease)    with amputations    Family History  Problem Relation Age of Onset   Diabetes Mellitus II Mother    Kidney disease Mother    Diabetes Mellitus II Father    CAD Father    Cancer Father        prostate   Kidney disease Father    Diabetes Mellitus II Brother    Kidney disease Brother    Diabetes Mellitus II Brother    Stomach cancer Brother 37   Kidney disease Brother    Colon cancer Neg Hx    Colon polyps Neg Hx    Esophageal cancer Neg Hx    Rectal cancer Neg Hx    Pancreatic cancer Neg Hx     Past Surgical History:  Procedure Laterality Date   A-FLUTTER ABLATION N/A 04/06/2020   Procedure:  A-FLUTTER ABLATION;  Surgeon: Waddell Danelle ORN, MD;  Location: [facility] INVASIVE CV LAB;  Service: Cardiovascular;  Laterality: N/A;   A/V FISTULAGRAM N/A 03/05/2023   Procedure: A/V Fistulagram;  Surgeon: Pearline [name] RAMAN, MD;  Location: [facility] INVASIVE CV LAB;  Service: Vascular;  Laterality: N/A;   A/V SHUNT INTERVENTION Left 11/07/2023   Procedure: A/V SHUNT INTERVENTION;  Surgeon: Gretta Lonni PARAS, MD;  Location: [facility] PV LAB;  Service: Cardiovascular;  Laterality: Left;   AMPUTATION Left 12/25/2013   Procedure: AMPUTATION RAY LEFT 5TH RAY;  Surgeon: Jerona Harden GAILS, MD;  Location: [facility] ORS;  Service: Orthopedics;  Laterality: Left;   AMPUTATION Right 12/15/2018   Procedure: AMPUTATION OF 4TH AND 5TH TOES RIGHT FOOT;  Surgeon: Harden Jerona GAILS, MD;  Location: [facility] OR;  Service: Orthopedics;  Laterality: Right;   AMPUTATION Left 02/02/2021   Procedure: LEFT FOOT 4TH RAY AMPUTATION;  Surgeon: Harden Jerona GAILS, MD;  Location: [facility] OR;  Service: Orthopedics;  Laterality: Left;   AMPUTATION Left 05/09/2021   Procedure: LEFT BELOW KNEE AMPUTATION;  Surgeon: Harden Jerona GAILS, MD;  Location: [facility] OR;  Service: Orthopedics;  Laterality: Left;   AMPUTATION Right 07/26/2022   Procedure: RIGHT BELOW KNEE AMPUTATION;  Surgeon: Harden Jerona GAILS, MD;  Location: [facility] OR;  Service: Orthopedics;  Laterality: Right;   APPLICATION OF WOUND VAC Right 12/15/2018   Procedure: APPLICATION OF WOUND VAC;  Surgeon: Harden Jerona GAILS, MD;  Location: [facility] OR;  Service: Orthopedics;  Laterality: Right;   AV FISTULA PLACEMENT Left 03/25/2019   Procedure: LEFT ARM ARTERIOVENOUS (AV) FISTULA CREATION;  Surgeon: Serene Gaile ORN, MD;  Location: [facility] OR;  Service: Vascular;  Laterality: Left;   BACK SURGERY     BASCILIC VEIN TRANSPOSITION Left 05/20/2019   Procedure: SECOND STAGE LEFT BASCILIC VEIN TRANSPOSITION;  Surgeon: Serene Gaile ORN, MD;  Location: [facility] OR;  Service: Vascular;  Laterality: Left;   BIOPSY  06/22/2022   Procedure:  BIOPSY;  Surgeon:  Wilhelmenia Aloha Raddle., MD;  Location: [facility] ENDOSCOPY;  Service: Gastroenterology;;   CARDIAC CATHETERIZATION  02/17/2014   CATARACT EXTRACTION Bilateral    CHOLECYSTECTOMY     COLONOSCOPY  2011   [place], [place]   COLONOSCOPY N/A 06/23/2022   Procedure: COLONOSCOPY;  Surgeon: Legrand Victory LITTIE DOUGLAS, MD;  Location: [facility] ENDOSCOPY;  Service: Gastroenterology;  Laterality: N/A;   CORONARY ARTERY BYPASS GRAFT  2008   CYSTOSCOPY N/A 06/29/2020   Procedure: CYSTOSCOPY WITH CLOT EVACUATION AND  FULGERATION;  Surgeon: Matilda Senior, MD;  Location: [facility] OR;  Service: Urology;  Laterality: N/A;   CYSTOSCOPY WITH FULGERATION Bilateral 06/17/2020   Procedure: CYSTOSCOPY,BILATERAL RETROGRADE, CLOT EVACUATION WITH FULGERATION OF THE BLADDER;  Surgeon: Selma Donnice SAUNDERS, MD;  Location: [facility] OR;  Service: Urology;  Laterality: Bilateral;   CYSTOSCOPY WITH FULGERATION N/A 06/28/2020   Procedure: CYSTOSCOPY WITH CLOT EVACUATION AND FULGERATION OF BLEEDERS;  Surgeon: Matilda Senior, MD;  Location: [facility] OR;  Service: Urology;  Laterality: N/A;  1 HR   ENTEROSCOPY N/A 06/22/2022   Procedure: ENTEROSCOPY;  Surgeon: Wilhelmenia Aloha Raddle., MD;  Location: [facility] ENDOSCOPY;  Service: Gastroenterology;  Laterality: N/A;   HEMOSTASIS CLIP PLACEMENT  06/22/2022   Procedure: HEMOSTASIS CLIP PLACEMENT;  Surgeon: Wilhelmenia Aloha Raddle., MD;  Location: [facility] ENDOSCOPY;  Service: Gastroenterology;;   HOT HEMOSTASIS N/A 06/22/2022   Procedure: HOT HEMOSTASIS (ARGON PLASMA COAGULATION/BICAP);  Surgeon: Wilhelmenia Aloha Raddle., MD;  Location: [facility] ENDOSCOPY;  Service: Gastroenterology;  Laterality: N/A;   I & D EXTREMITY Right 12/15/2018   Procedure: DEBRIDEMENT RIGHT FOOT;  Surgeon: Harden Jerona GAILS, MD;  Location: [facility] OR;  Service: Orthopedics;  Laterality: Right;   I & D EXTREMITY Right 08/27/2019   Procedure: PARTIAL CUBOID EXCISION RIGHT FOOT;  Surgeon: Harden Jerona GAILS, MD;  Location: [facility] OR;  Service: Orthopedics;  Laterality:  Right;   I & D EXTREMITY Right 01/07/2020   Procedure: RIGHT FOOT EXCISION INFECTED BONE;  Surgeon: Harden Jerona GAILS, MD;  Location: [facility] OR;  Service: Orthopedics;  Laterality: Right;   I & D EXTREMITY Right 05/17/2022   Procedure: IRRIGATION AND DEBRIDEMENT RIGHT FOOT ABSCESS;  Surgeon: Harden Jerona GAILS, MD;  Location: [facility] OR;  Service: Orthopedics;  Laterality: Right;   IR PARACENTESIS  10/11/2022   IR PARACENTESIS  12/24/2022   IR PARACENTESIS  02/06/2023   IR PARACENTESIS  05/05/2023   IR PARACENTESIS  05/19/2023   IR PARACENTESIS  07/11/2023   IR PARACENTESIS  07/30/2023   IR PARACENTESIS  08/20/2023   IR PARACENTESIS  09/19/2023   IR PARACENTESIS  10/03/2023   IR PARACENTESIS  10/17/2023   IR PARACENTESIS  10/30/2023   IR PARACENTESIS  11/19/2023   IR PARACENTESIS  12/03/2023   IR PARACENTESIS  12/16/2023   IR PARACENTESIS  01/12/2024   IR PARACENTESIS  02/06/2024   IR PARACENTESIS  02/20/2024   IR PARACENTESIS  03/03/2024   NECK SURGERY     novemver 2019   PERIPHERAL VASCULAR BALLOON ANGIOPLASTY Left 03/05/2023   Procedure: PERIPHERAL VASCULAR BALLOON ANGIOPLASTY;  Surgeon: Pearline [name] RAMAN, MD;  Location: [facility] INVASIVE CV LAB;  Service: Vascular;  Laterality: Left;  AVF   POLYPECTOMY  06/22/2022   Procedure: POLYPECTOMY;  Surgeon: Mansouraty, Aloha Raddle., MD;  Location: [facility] ENDOSCOPY;  Service: Gastroenterology;;   POLYPECTOMY  06/23/2022   Procedure: POLYPECTOMY;  Surgeon: Legrand Victory LITTIE DOUGLAS, MD;  Location: [facility] ENDOSCOPY;  Service: Gastroenterology;;   STUMP REVISION Left 06/27/2021   Procedure:  REVISION LEFT BELOW KNEE AMPUTATION;  Surgeon: Harden Jerona GAILS, MD;  Location: [facility] OR;  Service: Orthopedics;  Laterality: Left;   SUBMUCOSAL LIFTING INJECTION  06/22/2022   Procedure: SUBMUCOSAL LIFTING INJECTION;  Surgeon: Wilhelmenia Aloha Raddle., MD;  Location: [facility] ENDOSCOPY;  Service: Gastroenterology;;   SUBMUCOSAL TATTOO INJECTION  06/22/2022   Procedure: SUBMUCOSAL TATTOO INJECTION;  Surgeon:  Wilhelmenia Aloha Raddle., MD;  Location: [facility] ENDOSCOPY;  Service: Gastroenterology;;   TRANSURETHRAL RESECTION OF PROSTATE N/A 06/28/2020   Procedure: TRANSURETHRAL RESECTION OF THE PROSTATE (TURP);  Surgeon: Matilda Senior, MD;  Location: [facility] OR;  Service: Urology;  Laterality: N/A;   VENOUS ANGIOPLASTY  11/07/2023   Procedure: VENOUS ANGIOPLASTY;  Surgeon: Gretta Lonni PARAS, MD;  Location: [facility] PV LAB;  Service: Cardiovascular;;  Basilic Vein, Subclavian Vein   WISDOM TOOTH EXTRACTION     Social History   Occupational History   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  Tobacco Use   Smoking status: Former    Types: Cigars    Start date: 04/08/1973    Quit date: 12/24/1988    Years since quitting: 35.2   Smokeless tobacco: Never   Tobacco comments:    Cigars and Pipe   Vaping Use   Vaping status: Never Used  Substance and Sexual Activity   Alcohol  use: Never   Drug use: Never   Sexual activity: Yes    Partners: Female

## 2024-03-16 ENCOUNTER — Emergency Department (HOSPITAL_COMMUNITY)

## 2024-03-16 ENCOUNTER — Inpatient Hospital Stay (HOSPITAL_COMMUNITY)

## 2024-03-16 ENCOUNTER — Encounter (HOSPITAL_COMMUNITY): Payer: Self-pay | Admitting: Internal Medicine

## 2024-03-16 ENCOUNTER — Other Ambulatory Visit: Payer: Self-pay

## 2024-03-16 ENCOUNTER — Inpatient Hospital Stay (HOSPITAL_COMMUNITY)
Admission: EM | Admit: 2024-03-16 | Discharge: 2024-03-18 | DRG: 640 | Disposition: A | Discharge status: Home / Self Care | Attending: Internal Medicine | Admitting: Internal Medicine

## 2024-03-16 DIAGNOSIS — M109 Gout, unspecified: Secondary | ICD-10-CM | POA: Diagnosis present

## 2024-03-16 DIAGNOSIS — R051 Acute cough: Secondary | ICD-10-CM

## 2024-03-16 DIAGNOSIS — E8770 Fluid overload, unspecified: Secondary | ICD-10-CM | POA: Diagnosis present

## 2024-03-16 DIAGNOSIS — N186 End stage renal disease: Secondary | ICD-10-CM | POA: Diagnosis not present

## 2024-03-16 DIAGNOSIS — T8781 Dehiscence of amputation stump: Secondary | ICD-10-CM | POA: Diagnosis not present

## 2024-03-16 DIAGNOSIS — R188 Other ascites: Secondary | ICD-10-CM

## 2024-03-16 DIAGNOSIS — D638 Anemia in other chronic diseases classified elsewhere: Secondary | ICD-10-CM | POA: Diagnosis present

## 2024-03-16 DIAGNOSIS — E119 Type 2 diabetes mellitus without complications: Secondary | ICD-10-CM

## 2024-03-16 DIAGNOSIS — E039 Hypothyroidism, unspecified: Secondary | ICD-10-CM | POA: Diagnosis present

## 2024-03-16 DIAGNOSIS — R531 Weakness: Secondary | ICD-10-CM

## 2024-03-16 DIAGNOSIS — J90 Pleural effusion, not elsewhere classified: Principal | ICD-10-CM

## 2024-03-16 DIAGNOSIS — R197 Diarrhea, unspecified: Secondary | ICD-10-CM | POA: Insufficient documentation

## 2024-03-16 DIAGNOSIS — Z951 Presence of aortocoronary bypass graft: Secondary | ICD-10-CM

## 2024-03-16 DIAGNOSIS — C61 Malignant neoplasm of prostate: Secondary | ICD-10-CM | POA: Diagnosis present

## 2024-03-16 DIAGNOSIS — G959 Disease of spinal cord, unspecified: Secondary | ICD-10-CM | POA: Diagnosis present

## 2024-03-16 DIAGNOSIS — I10 Essential (primary) hypertension: Secondary | ICD-10-CM | POA: Diagnosis present

## 2024-03-16 DIAGNOSIS — I2510 Atherosclerotic heart disease of native coronary artery without angina pectoris: Secondary | ICD-10-CM | POA: Diagnosis present

## 2024-03-16 DIAGNOSIS — G629 Polyneuropathy, unspecified: Secondary | ICD-10-CM

## 2024-03-16 LAB — CBC WITH DIFFERENTIAL/PLATELET
Abs Immature Granulocytes: 0.05 K/uL (ref 0.00–0.07)
Basophils Absolute: 0.1 K/uL (ref 0.0–0.1)
Basophils Relative: 2 %
Eosinophils Absolute: 0.2 K/uL (ref 0.0–0.5)
Eosinophils Relative: 5 %
HCT: 38.7 % — ABNORMAL LOW (ref 39.0–52.0)
Hemoglobin: 12.4 g/dL — ABNORMAL LOW (ref 13.0–17.0)
Immature Granulocytes: 1 %
Lymphocytes Relative: 8 %
Lymphs Abs: 0.4 K/uL — ABNORMAL LOW (ref 0.7–4.0)
MCH: 29.2 pg (ref 26.0–34.0)
MCHC: 32 g/dL (ref 30.0–36.0)
MCV: 91.3 fL (ref 80.0–100.0)
Monocytes Absolute: 0.6 K/uL (ref 0.1–1.0)
Monocytes Relative: 12 %
Neutro Abs: 3.7 K/uL (ref 1.7–7.7)
Neutrophils Relative %: 72 %
Platelets: 129 K/uL — ABNORMAL LOW (ref 150–400)
RBC: 4.24 MIL/uL (ref 4.22–5.81)
RDW: 15.2 % (ref 11.5–15.5)
WBC: 5.1 K/uL (ref 4.0–10.5)
nRBC: 0 % (ref 0.0–0.2)

## 2024-03-16 LAB — COMPREHENSIVE METABOLIC PANEL WITH GFR
ALT: 12 U/L (ref 0–44)
AST: 23 U/L (ref 15–41)
Albumin: 2.7 g/dL — ABNORMAL LOW (ref 3.5–5.0)
Alkaline Phosphatase: 129 U/L — ABNORMAL HIGH (ref 38–126)
Anion gap: 17 — ABNORMAL HIGH (ref 5–15)
BUN: 32 mg/dL — ABNORMAL HIGH (ref 8–23)
CO2: 23 mmol/L (ref 22–32)
Calcium: 8.2 mg/dL — ABNORMAL LOW (ref 8.9–10.3)
Chloride: 99 mmol/L (ref 98–111)
Creatinine, Ser: 6.32 mg/dL — ABNORMAL HIGH (ref 0.61–1.24)
GFR, Estimated: 9 mL/min — ABNORMAL LOW (ref >60)
Glucose, Bld: 88 mg/dL (ref 70–99)
Potassium: 4.4 mmol/L (ref 3.5–5.1)
Sodium: 139 mmol/L (ref 135–145)
Total Bilirubin: 1.3 mg/dL — ABNORMAL HIGH (ref 0.0–1.2)
Total Protein: 5.5 g/dL — ABNORMAL LOW (ref 6.5–8.1)

## 2024-03-16 LAB — CBC
HCT: 38 % — ABNORMAL LOW (ref 39.0–52.0)
Hemoglobin: 12 g/dL — ABNORMAL LOW (ref 13.0–17.0)
MCH: 28.6 pg (ref 26.0–34.0)
MCHC: 31.6 g/dL (ref 30.0–36.0)
MCV: 90.5 fL (ref 80.0–100.0)
Platelets: 130 K/uL — ABNORMAL LOW (ref 150–400)
RBC: 4.2 MIL/uL — ABNORMAL LOW (ref 4.22–5.81)
RDW: 15.1 % (ref 11.5–15.5)
WBC: 5.3 K/uL (ref 4.0–10.5)
nRBC: 0 % (ref 0.0–0.2)

## 2024-03-16 LAB — HEPATIC FUNCTION PANEL
ALT: 11 U/L (ref 0–44)
AST: 19 U/L (ref 15–41)
Albumin: 2.7 g/dL — ABNORMAL LOW (ref 3.5–5.0)
Alkaline Phosphatase: 132 U/L — ABNORMAL HIGH (ref 38–126)
Bilirubin, Direct: 0.2 mg/dL (ref 0.0–0.2)
Indirect Bilirubin: 1.1 mg/dL — ABNORMAL HIGH (ref 0.3–0.9)
Total Bilirubin: 1.3 mg/dL — ABNORMAL HIGH (ref 0.0–1.2)
Total Protein: 5.5 g/dL — ABNORMAL LOW (ref 6.5–8.1)

## 2024-03-16 LAB — BLOOD GAS, VENOUS
Acid-Base Excess: 4.8 mmol/L — ABNORMAL HIGH (ref 0.0–2.0)
Bicarbonate: 30.9 mmol/L — ABNORMAL HIGH (ref 20.0–28.0)
O2 Saturation: 41.6 %
Patient temperature: 37
pCO2, Ven: 51 mmHg (ref 44–60)
pH, Ven: 7.39 (ref 7.25–7.43)
pO2, Ven: <31 mmHg — CL (ref 32–45)

## 2024-03-16 LAB — BASIC METABOLIC PANEL WITH GFR
Anion gap: 14 (ref 5–15)
BUN: 31 mg/dL — ABNORMAL HIGH (ref 8–23)
CO2: 26 mmol/L (ref 22–32)
Calcium: 8.3 mg/dL — ABNORMAL LOW (ref 8.9–10.3)
Chloride: 100 mmol/L (ref 98–111)
Creatinine, Ser: 6.04 mg/dL — ABNORMAL HIGH (ref 0.61–1.24)
GFR, Estimated: 9 mL/min — ABNORMAL LOW (ref >60)
Glucose, Bld: 94 mg/dL (ref 70–99)
Potassium: 4.2 mmol/L (ref 3.5–5.1)
Sodium: 140 mmol/L (ref 135–145)

## 2024-03-16 LAB — I-STAT CHEM 8, ED
BUN: 32 mg/dL — ABNORMAL HIGH (ref 8–23)
Calcium, Ion: 0.99 mmol/L — ABNORMAL LOW (ref 1.15–1.40)
Chloride: 101 mmol/L (ref 98–111)
Creatinine, Ser: 5.9 mg/dL — ABNORMAL HIGH (ref 0.61–1.24)
Glucose, Bld: 91 mg/dL (ref 70–99)
HCT: 37 % — ABNORMAL LOW (ref 39.0–52.0)
Hemoglobin: 12.6 g/dL — ABNORMAL LOW (ref 13.0–17.0)
Potassium: 4.2 mmol/L (ref 3.5–5.1)
Sodium: 139 mmol/L (ref 135–145)
TCO2: 27 mmol/L (ref 22–32)

## 2024-03-16 LAB — LIPASE, BLOOD: Lipase: 50 U/L (ref 11–51)

## 2024-03-16 LAB — TROPONIN I (HIGH SENSITIVITY): Troponin I (High Sensitivity): 57 ng/L — ABNORMAL HIGH (ref <18)

## 2024-03-16 LAB — TYPE AND SCREEN
ABO/RH(D): O POS
Antibody Screen: NEGATIVE

## 2024-03-16 LAB — RESP PANEL BY RT-PCR (RSV, FLU A&B, COVID)  RVPGX2
Influenza A by PCR: NEGATIVE
Influenza B by PCR: NEGATIVE
Resp Syncytial Virus by PCR: NEGATIVE
SARS Coronavirus 2 by RT PCR: NEGATIVE

## 2024-03-16 LAB — I-STAT CG4 LACTIC ACID, ED: Lactic Acid, Venous: 1.5 mmol/L (ref 0.5–1.9)

## 2024-03-16 MED ORDER — MODAFINIL 100 MG PO TABS
200.0000 mg | ORAL_TABLET | Freq: Every day | ORAL | Status: DC
  Administered 2024-03-17 – 2024-03-18 (×2): 200 mg via ORAL
  Filled 2024-03-16 (×2): qty 2

## 2024-03-16 MED ORDER — PANTOPRAZOLE SODIUM 40 MG PO TBEC
40.0000 mg | DELAYED_RELEASE_TABLET | Freq: Every day | ORAL | Status: DC
  Administered 2024-03-17 – 2024-03-18 (×2): 40 mg via ORAL
  Filled 2024-03-16 (×2): qty 1

## 2024-03-16 MED ORDER — LACTATED RINGERS IV BOLUS
500.0000 mL | Freq: Once | INTRAVENOUS | Status: AC
  Administered 2024-03-16: 500 mL via INTRAVENOUS

## 2024-03-16 MED ORDER — LEVOTHYROXINE SODIUM 112 MCG PO TABS
112.0000 ug | ORAL_TABLET | Freq: Every day | ORAL | Status: DC
  Administered 2024-03-17: 112 ug via ORAL
  Filled 2024-03-16: qty 1

## 2024-03-16 MED ORDER — ROSUVASTATIN CALCIUM 5 MG PO TABS
10.0000 mg | ORAL_TABLET | Freq: Every day | ORAL | Status: DC
  Administered 2024-03-17 – 2024-03-18 (×2): 10 mg via ORAL
  Filled 2024-03-16 (×2): qty 2

## 2024-03-16 MED ORDER — ACETAMINOPHEN 325 MG PO TABS
650.0000 mg | ORAL_TABLET | Freq: Four times a day (QID) | ORAL | Status: DC | PRN
  Administered 2024-03-17: 650 mg via ORAL
  Filled 2024-03-16: qty 2

## 2024-03-16 MED ORDER — PREGABALIN 75 MG PO CAPS
150.0000 mg | ORAL_CAPSULE | Freq: Three times a day (TID) | ORAL | Status: DC
  Administered 2024-03-17 – 2024-03-18 (×3): 150 mg via ORAL
  Filled 2024-03-16 (×4): qty 2

## 2024-03-16 MED ORDER — ALLOPURINOL 100 MG PO TABS
200.0000 mg | ORAL_TABLET | Freq: Every day | ORAL | Status: DC
  Administered 2024-03-17 – 2024-03-18 (×2): 200 mg via ORAL
  Filled 2024-03-16 (×2): qty 2

## 2024-03-16 MED ORDER — ACETAMINOPHEN 650 MG RE SUPP: 650.0000 mg | Freq: Four times a day (QID) | RECTAL | Status: DC | PRN

## 2024-03-16 MED ORDER — SEVELAMER CARBONATE 800 MG PO TABS
800.0000 mg | ORAL_TABLET | Freq: Three times a day (TID) | ORAL | Status: DC
  Administered 2024-03-16 – 2024-03-18 (×3): 800 mg via ORAL
  Filled 2024-03-16 (×4): qty 1

## 2024-03-16 NOTE — ED Provider Notes (Signed)
 [facility] AT [facility] Provider Note   CSN: [id number] Arrival date & time: 03/16/24  1639     Patient presents with: Weakness and Cough   [name] is a 73 y.o. male.   73 year old male presents for evaluation of weakness and cough.  He also states has had diarrhea.  Supposed go to dialysis tomorrow.  He is a very poor historian.  States he is very thirsty.  Denies any other symptoms or concerns.   Weakness Associated symptoms: cough and diarrhea   Associated symptoms: no abdominal pain, no arthralgias, no chest pain, no dysuria, no fever, no seizures, no shortness of breath and no vomiting   Cough Associated symptoms: no chest pain, no chills, no ear pain, no fever, no rash, no shortness of breath and no sore throat        Prior to Admission medications   Medication Sig Start Date End Date Taking? Authorizing Provider  allopurinol  (ZYLOPRIM ) 100 MG tablet Take 2 tablets (200 mg total) by mouth daily. 08/08/22  Yes Angiulli, Toribio PARAS, PA-C  levothyroxine  (SYNTHROID ) 112 MCG tablet Take 112 mcg by mouth daily before breakfast.   Yes [provider]  modafinil  (PROVIGIL ) 200 MG tablet Take 1 tablet (200 mg total) by mouth daily. 12/22/23  Yes Raulkar, Sven SQUIBB, MD  pantoprazole  (PROTONIX ) 40 MG tablet Take 1 tablet (40 mg total) by mouth daily. 12/19/23  Yes [name], NP  pregabalin  (LYRICA ) 150 MG capsule Take 150 mg by mouth in the morning, at noon, in the evening, and at bedtime.   Yes [provider]  rosuvastatin  (CRESTOR ) 10 MG tablet Take 1 tablet (10 mg total) by mouth daily. 12/19/23 03/18/24 Yes [name], NP  sevelamer  carbonate (RENVELA ) 800 MG tablet Take 800 mg by mouth 3 (three) times daily. 01/17/23  Yes [provider]    Allergies: Cefazolin , Morphine , and Mushroom extract complex (obsolete)    Review of Systems  Constitutional:  Negative for chills and fever.  HENT:  Negative  for ear pain and sore throat.   Eyes:  Negative for pain and visual disturbance.  Respiratory:  Positive for cough. Negative for shortness of breath.   Cardiovascular:  Negative for chest pain and palpitations.  Gastrointestinal:  Positive for diarrhea. Negative for abdominal pain and vomiting.  Genitourinary:  Negative for dysuria and hematuria.  Musculoskeletal:  Negative for arthralgias and back pain.  Skin:  Negative for color change and rash.  Neurological:  Positive for weakness. Negative for seizures and syncope.  All other systems reviewed and are negative.   Updated Vital Signs BP (!) 164/68   Pulse 61   Temp 98.7 F (37.1 C)   Resp 20   Ht 5' 11 (1.803 m)   Wt 90.7 kg   SpO2 96%   BMI 27.89 kg/m   Physical Exam Vitals and nursing note reviewed.  Constitutional:      General: He is not in acute distress.    Appearance: Normal appearance. He is well-developed. He is not ill-appearing.  HENT:     Head: Normocephalic and atraumatic.  Eyes:     Conjunctiva/sclera: Conjunctivae normal.  Cardiovascular:     Rate and Rhythm: Normal rate and regular rhythm.     Pulses: Normal pulses.     Heart sounds: Normal heart sounds. No murmur heard. Pulmonary:     Effort: Pulmonary effort is normal. No respiratory distress.     Breath sounds: Normal breath  sounds.  Abdominal:     General: There is distension.     Palpations: Abdomen is soft.     Tenderness: There is no abdominal tenderness.  Musculoskeletal:        General: No swelling.     Cervical back: Neck supple.  Skin:    General: Skin is warm and dry.     Capillary Refill: Capillary refill takes less than 2 seconds.  Neurological:     Mental Status: He is alert.  Psychiatric:        Mood and Affect: Mood normal.     (all labs ordered are listed, but only abnormal results are displayed) Labs Reviewed  BASIC METABOLIC PANEL WITH GFR - Abnormal; Notable for the following components:      Result Value   BUN 31  (*)    Creatinine, Ser 6.04 (*)    Calcium  8.3 (*)    GFR, Estimated 9 (*)    All other components within normal limits  HEPATIC FUNCTION PANEL - Abnormal; Notable for the following components:   Total Protein 5.5 (*)    Albumin  2.7 (*)    Alkaline Phosphatase 132 (*)    Total Bilirubin 1.3 (*)    Indirect Bilirubin 1.1 (*)    All other components within normal limits  CBC WITH DIFFERENTIAL/PLATELET - Abnormal; Notable for the following components:   Hemoglobin 12.4 (*)    HCT 38.7 (*)    Platelets 129 (*)    Lymphs Abs 0.4 (*)    All other components within normal limits  I-STAT CHEM 8, ED - Abnormal; Notable for the following components:   BUN 32 (*)    Creatinine, Ser 5.90 (*)    Calcium , Ion 0.99 (*)    Hemoglobin 12.6 (*)    HCT 37.0 (*)    All other components within normal limits  RESP PANEL BY RT-PCR (RSV, FLU A&B, COVID)  RVPGX2  C DIFFICILE QUICK SCREEN W PCR REFLEX    GASTROINTESTINAL PANEL BY PCR, STOOL (REPLACES STOOL CULTURE)  CULTURE, BLOOD (ROUTINE X 2)  CULTURE, BLOOD (ROUTINE X 2)  LIPASE, BLOOD  URINALYSIS, W/ REFLEX TO CULTURE (INFECTION SUSPECTED)  TSH  COMPREHENSIVE METABOLIC PANEL WITH GFR  CBC  BLOOD GAS, VENOUS  I-STAT CG4 LACTIC ACID, ED  TYPE AND SCREEN  TROPONIN I (HIGH SENSITIVITY)    EKG: EKG Interpretation Date/Time:  Tuesday March 16 2024 17:37:33 EST Ventricular Rate:  74 PR Interval:    QRS Duration:  123 QT Interval:  478 QTC Calculation: 531 R Axis:   86  Text Interpretation: Atrial fibrillation Ventricular premature complex IVCD, consider atypical RBBB Compared with prior EKG from 01/08/2024 Confirmed by Gennaro Bouchard ([id number]) on 03/16/2024 5:39:33 PM  Radiology: CT ABDOMEN PELVIS WO CONTRAST Result Date: 03/16/2024 EXAM: CT ABDOMEN AND PELVIS WITHOUT CONTRAST 03/16/2024 06:04:28 PM TECHNIQUE: CT of the abdomen and pelvis was performed without the administration of intravenous contrast. Multiplanar reformatted images are  provided for review. Automated exposure control, iterative reconstruction, and/or weight-based adjustment of the mA/kV was utilized to reduce the radiation dose to as low as reasonably achievable. COMPARISON: 01/08/2024 CLINICAL HISTORY: abd pain, diarrhea FINDINGS: LOWER CHEST: Moderate right pleural effusion, new since prior study. Compressive atelectasis in the right lower lobe and right middle lobe. LIVER: The liver is shrunken and nodular, compatible with cirrhosis. No suspicious focal hepatic abnormality. GALLBLADDER AND BILE DUCTS: Prior cholecystectomy. No biliary ductal dilatation. SPLEEN: The spleen is at the upper limit of normal in size  at 12.5 cm. PANCREAS: No acute abnormality. ADRENAL GLANDS: Right adrenal mass with areas of low density centrally compatible with adenoma measuring up to 3.3 cm, stable. The left adrenal gland is unremarkable. KIDNEYS, URETERS AND BLADDER: Kidneys are atrophic. Right upper pole renal low density lesion measures up to 3.3 cm compared to 3.9 cm previously, likely benign cyst. Per consensus, no follow-up is needed for simple Bosniak type 1 and 2 renal cysts, unless the patient has a malignancy history or risk factors. No stones in the kidneys or ureters. No hydronephrosis. No perinephric or periureteral stranding. Urinary bladder is unremarkable. GI AND BOWEL: Stomach demonstrates no acute abnormality. Sigmoid diverticulosis. No acute diverticulitis. Clips again noted in the third portion of the duodenum, unchanged. There is no bowel obstruction. PERITONEUM AND RETROPERITONEUM: Large volume ascites is stable since the prior study. No free air. VASCULATURE: Diffuse coronary artery and aortic atherosclerosis. Aorta is normal in caliber. LYMPH NODES: No lymphadenopathy. REPRODUCTIVE ORGANS: Radiation seeds in the region of the prostate. BONES AND SOFT TISSUES: Small right inguinal hernia containing ascites. No acute osseous abnormality. No focal soft tissue abnormality.  IMPRESSION: 1. Moderate right pleural effusion, new since prior study, with associated compressive atelectasis in the right lower lobe and right middle lobe. 2. Large volume ascites, stable since the prior study. 3. Liver with shrunken and nodular appearance, compatible with cirrhosis. No suspicious focal hepatic abnormality. Electronically signed by: Franky Crease MD 03/16/2024 06:31 PM EST RP Workstation: [id number]   DG Chest 1 View Result Date: 03/16/2024 CLINICAL DATA:  Cough diarrhea respiratory distress EXAM: DG CHEST 1V COMPARISON:  01/08/2024, 10/31/2023 FINDINGS: Hardware in the cervical spine. Sternotomy. Hypoventilatory changes with elevation of right diaphragm. At least small right-sided pleural effusion, possibly moderate in size. Airspace disease at the right base. Stable cardiomediastinal silhouette. Patchy airspace disease at the left base as well. IMPRESSION: Hypoventilatory changes with elevation of right diaphragm. At least small right-sided pleural effusion, possibly moderate in size. Airspace disease at the right base may be due to atelectasis or pneumonia. Patchy airspace opacities at left base, possible foci of pneumonia. Electronically Signed   By: Luke Bun M.D.   On: 03/16/2024 18:04     Procedures   Medications Ordered in the ED  allopurinol  (ZYLOPRIM ) tablet 200 mg (has no administration in time range)  rosuvastatin  (CRESTOR ) tablet 10 mg (has no administration in time range)  modafinil  (PROVIGIL ) tablet 200 mg (has no administration in time range)  levothyroxine  (SYNTHROID ) tablet 112 mcg (has no administration in time range)  pantoprazole  (PROTONIX ) EC tablet 40 mg (has no administration in time range)  sevelamer  carbonate (RENVELA ) tablet 800 mg (has no administration in time range)  pregabalin  (LYRICA ) capsule 150 mg (has no administration in time range)  acetaminophen  (TYLENOL ) tablet 650 mg (has no administration in time range)    Or  acetaminophen  (TYLENOL )  suppository 650 mg (has no administration in time range)  lactated ringers  bolus 500 mL (0 mLs Intravenous Stopped 03/16/24 1838)                                    Medical Decision Making Cardiac monitor interpretation: Sinus rhythm, no ectopy  Patient here for lethargy, cough and diarrhea.  Appears fatigued on exam, vitals are fairly stable.  Was given to improve his symptoms.  CT reveals ascites.  Patient also found to have a large pleural effusion.  Not hypoxic at  this time.  Left this was and labs otherwise fairly unremarkable and baseline for this patient.  Discussed patient's case with hospitalist the patient will be admitted for further workup and management.  Patient is agreeable with the plan.  Problems Addressed: Acute cough: acute illness or injury Diarrhea, unspecified type: acute illness or injury Generalized weakness: undiagnosed new problem with uncertain prognosis Other ascites: chronic illness or injury Pleural effusion: undiagnosed new problem with uncertain prognosis  Amount and/or Complexity of Data Reviewed Independent Historian: EMS    Details: Provide history that was obtained from the spouse as patient is a poor historian.  She states patient was in respiratory distress prior to arrival of EMS and that is why she called 911. External Data Reviewed: notes.    Details: Prior ED records reviewed and hospital records reviewed and patient admitted 01-08-2024 for fever Labs: ordered. Decision-making details documented in ED Course.    Details: Ordered and reviewed by me and baseline for patient, labs otherwise unremarkable except for elevated creatinine which is his baseline Radiology: ordered and independent interpretation performed. Decision-making details documented in ED Course.    Details: Ordered and interpreted by me independently of radiology Chest x-ray: Shows evidence of pleural effusion CT abdomen pelvis: Shows ascites and large pleural effusion CT head:  Shows no acute abnormality ECG/medicine tests: ordered and independent interpretation performed. Decision-making details documented in ED Course.    Details: Ordered and interpreted by me in the absence of cardiology and shows sinus rhythm, no STEMI, or significant change when compared to prior EKG Discussion of management or test interpretation with external provider(s): Dr. Franky -hospitalist-spoke with him regarding the patient's case and he will meet the patient for further workup and management  Risk OTC drugs. Prescription drug management. Drug therapy requiring intensive monitoring for toxicity. Decision regarding hospitalization.     Final diagnoses:  Pleural effusion  Other ascites  Acute cough  Diarrhea, unspecified type  Generalized weakness    ED Discharge Orders     None          Gennaro Duwaine CROME, DO 03/16/24 2135

## 2024-03-16 NOTE — H&P (Signed)
 History and Physical    [name]:[id number] DOB: August 05, 1950 DOA: 03/16/2024  Patient coming from: ***  Chief Complaint: ***  HPI: [name] is a 73 y.o. male with ***   ED Course: ***  Review of Systems: As per HPI, rest all negative.   Past Medical History:  Diagnosis Date   Acute osteomyelitis of metatarsal bone of left foot (HCC)    Ambulates with cane    straight cane   Anemia    Cervical myelopathy (HCC) 02/06/2018   Chronic kidney disease    dailysis M [facility] F- home   Community acquired pneumonia of left lower lobe of lung 08/18/2021   Complication of anesthesia    Coronary artery disease    Dehiscence of amputation stump of left lower extremity (HCC)    Diabetes mellitus without complication (HCC)    type2   Diabetic foot ulcer (HCC)    Diabetic neuropathy (HCC) 02/06/2018   Dysrhythmia    Gait abnormality 08/24/2018   GERD (gastroesophageal reflux disease)     01/06/20- not current   Gout    History of blood transfusion    History of blood transfusion    History of kidney stones    passed stones   Hypercholesteremia    Hypertension    Hypothyroidism    Neuromuscular disorder (HCC)    neuropathy left leg and bilateral feet   Neuropathy    Partial nontraumatic amputation of foot, 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   PONV (postoperative nausea and vomiting)    Prostate cancer (HCC)    PVD (peripheral vascular disease)    with amputations    Past Surgical History:  Procedure Laterality Date   A-FLUTTER ABLATION N/A 04/06/2020   Procedure: A-FLUTTER ABLATION;  Surgeon: Waddell Danelle ORN, MD;  Location: [facility] INVASIVE CV LAB;  Service: Cardiovascular;  Laterality: N/A;   A/V FISTULAGRAM N/A 03/05/2023   Procedure: A/V Fistulagram;  Surgeon: Pearline [name] RAMAN, MD;  Location: [facility] INVASIVE CV LAB;  Service: Vascular;  Laterality: N/A;   A/V SHUNT INTERVENTION Left 11/07/2023   Procedure: A/V SHUNT INTERVENTION;  Surgeon: Gretta Lonni PARAS, MD;  Location: [facility] PV LAB;   Service: Cardiovascular;  Laterality: Left;   AMPUTATION Left 12/25/2013   Procedure: AMPUTATION RAY LEFT 5TH RAY;  Surgeon: Jerona Harden GAILS, MD;  Location: [facility] ORS;  Service: Orthopedics;  Laterality: Left;   AMPUTATION Right 12/15/2018   Procedure: AMPUTATION OF 4TH AND 5TH TOES RIGHT FOOT;  Surgeon: Harden Jerona GAILS, MD;  Location: [facility] OR;  Service: Orthopedics;  Laterality: Right;   AMPUTATION Left 02/02/2021   Procedure: LEFT FOOT 4TH RAY AMPUTATION;  Surgeon: Harden Jerona GAILS, MD;  Location: [facility] OR;  Service: Orthopedics;  Laterality: Left;   AMPUTATION Left 05/09/2021   Procedure: LEFT BELOW KNEE AMPUTATION;  Surgeon: Harden Jerona GAILS, MD;  Location: [facility] OR;  Service: Orthopedics;  Laterality: Left;   AMPUTATION Right 07/26/2022   Procedure: RIGHT BELOW KNEE AMPUTATION;  Surgeon: Harden Jerona GAILS, MD;  Location: [facility] OR;  Service: Orthopedics;  Laterality: Right;   APPLICATION OF WOUND VAC Right 12/15/2018   Procedure: APPLICATION OF WOUND VAC;  Surgeon: Harden Jerona GAILS, MD;  Location: [facility] OR;  Service: Orthopedics;  Laterality: Right;   AV FISTULA PLACEMENT Left 03/25/2019   Procedure: LEFT ARM ARTERIOVENOUS (AV) FISTULA CREATION;  Surgeon: Serene Gaile ORN, MD;  Location: [facility] OR;  Service: Vascular;  Laterality: Left;   BACK SURGERY     BASCILIC VEIN TRANSPOSITION Left 05/20/2019  Procedure: SECOND STAGE LEFT BASCILIC VEIN TRANSPOSITION;  Surgeon: Serene Gaile ORN, MD;  Location: [facility] OR;  Service: Vascular;  Laterality: Left;   BIOPSY  06/22/2022   Procedure: BIOPSY;  Surgeon: Wilhelmenia Aloha Raddle., MD;  Location: [facility] ENDOSCOPY;  Service: Gastroenterology;;   CARDIAC CATHETERIZATION  02/17/2014   CATARACT EXTRACTION Bilateral    CHOLECYSTECTOMY     COLONOSCOPY  2011   in [place], [place]   COLONOSCOPY N/A 06/23/2022   Procedure: COLONOSCOPY;  Surgeon: Legrand Victory LITTIE DOUGLAS, MD;  Location: [facility] ENDOSCOPY;  Service: Gastroenterology;  Laterality: N/A;   CORONARY ARTERY BYPASS GRAFT  2008   CYSTOSCOPY  N/A 06/29/2020   Procedure: CYSTOSCOPY WITH CLOT EVACUATION AND  FULGERATION;  Surgeon: Matilda Senior, MD;  Location: [facility] OR;  Service: Urology;  Laterality: N/A;   CYSTOSCOPY WITH FULGERATION Bilateral 06/17/2020   Procedure: CYSTOSCOPY,BILATERAL RETROGRADE, CLOT EVACUATION WITH FULGERATION OF THE BLADDER;  Surgeon: Selma Donnice SAUNDERS, MD;  Location: [facility] OR;  Service: Urology;  Laterality: Bilateral;   CYSTOSCOPY WITH FULGERATION N/A 06/28/2020   Procedure: CYSTOSCOPY WITH CLOT EVACUATION AND FULGERATION OF BLEEDERS;  Surgeon: Matilda Senior, MD;  Location: [facility] OR;  Service: Urology;  Laterality: N/A;  1 HR   ENTEROSCOPY N/A 06/22/2022   Procedure: ENTEROSCOPY;  Surgeon: Wilhelmenia Aloha Raddle., MD;  Location: [facility] ENDOSCOPY;  Service: Gastroenterology;  Laterality: N/A;   HEMOSTASIS CLIP PLACEMENT  06/22/2022   Procedure: HEMOSTASIS CLIP PLACEMENT;  Surgeon: Wilhelmenia Aloha Raddle., MD;  Location: [facility] ENDOSCOPY;  Service: Gastroenterology;;   HOT HEMOSTASIS N/A 06/22/2022   Procedure: HOT HEMOSTASIS (ARGON PLASMA COAGULATION/BICAP);  Surgeon: Wilhelmenia Aloha Raddle., MD;  Location: [facility] ENDOSCOPY;  Service: Gastroenterology;  Laterality: N/A;   I & D EXTREMITY Right 12/15/2018   Procedure: DEBRIDEMENT RIGHT FOOT;  Surgeon: Harden Jerona GAILS, MD;  Location: [facility] OR;  Service: Orthopedics;  Laterality: Right;   I & D EXTREMITY Right 08/27/2019   Procedure: PARTIAL CUBOID EXCISION RIGHT FOOT;  Surgeon: Harden Jerona GAILS, MD;  Location: [facility] OR;  Service: Orthopedics;  Laterality: Right;   I & D EXTREMITY Right 01/07/2020   Procedure: RIGHT FOOT EXCISION INFECTED BONE;  Surgeon: Harden Jerona GAILS, MD;  Location: [facility] OR;  Service: Orthopedics;  Laterality: Right;   I & D EXTREMITY Right 05/17/2022   Procedure: IRRIGATION AND DEBRIDEMENT RIGHT FOOT ABSCESS;  Surgeon: Harden Jerona GAILS, MD;  Location: [facility] OR;  Service: Orthopedics;  Laterality: Right;   IR PARACENTESIS  10/11/2022   IR PARACENTESIS  12/24/2022   IR  PARACENTESIS  02/06/2023   IR PARACENTESIS  05/05/2023   IR PARACENTESIS  05/19/2023   IR PARACENTESIS  07/11/2023   IR PARACENTESIS  07/30/2023   IR PARACENTESIS  08/20/2023   IR PARACENTESIS  09/19/2023   IR PARACENTESIS  10/03/2023   IR PARACENTESIS  10/17/2023   IR PARACENTESIS  10/30/2023   IR PARACENTESIS  11/19/2023   IR PARACENTESIS  12/03/2023   IR PARACENTESIS  12/16/2023   IR PARACENTESIS  01/12/2024   IR PARACENTESIS  02/06/2024   IR PARACENTESIS  02/20/2024   IR PARACENTESIS  03/03/2024   NECK SURGERY     novemver 2019   PERIPHERAL VASCULAR BALLOON ANGIOPLASTY Left 03/05/2023   Procedure: PERIPHERAL VASCULAR BALLOON ANGIOPLASTY;  Surgeon: Pearline [name] RAMAN, MD;  Location: [facility] INVASIVE CV LAB;  Service: Vascular;  Laterality: Left;  AVF   POLYPECTOMY  06/22/2022   Procedure: POLYPECTOMY;  Surgeon: Mansouraty, Aloha Raddle., MD;  Location: [facility] ENDOSCOPY;  Service: Gastroenterology;;  POLYPECTOMY  06/23/2022   Procedure: POLYPECTOMY;  Surgeon: Legrand Victory LITTIE DOUGLAS, MD;  Location: [facility] ENDOSCOPY;  Service: Gastroenterology;;   STUMP REVISION Left 06/27/2021   Procedure: REVISION LEFT BELOW KNEE AMPUTATION;  Surgeon: Harden Jerona GAILS, MD;  Location: [facility] OR;  Service: Orthopedics;  Laterality: Left;   SUBMUCOSAL LIFTING INJECTION  06/22/2022   Procedure: SUBMUCOSAL LIFTING INJECTION;  Surgeon: Wilhelmenia Aloha Raddle., MD;  Location: [facility] ENDOSCOPY;  Service: Gastroenterology;;   SUBMUCOSAL TATTOO INJECTION  06/22/2022   Procedure: SUBMUCOSAL TATTOO INJECTION;  Surgeon: Wilhelmenia Aloha Raddle., MD;  Location: [facility] ENDOSCOPY;  Service: Gastroenterology;;   TRANSURETHRAL RESECTION OF PROSTATE N/A 06/28/2020   Procedure: TRANSURETHRAL RESECTION OF THE PROSTATE (TURP);  Surgeon: Matilda Senior, MD;  Location: [facility] OR;  Service: Urology;  Laterality: N/A;   VENOUS ANGIOPLASTY  11/07/2023   Procedure: VENOUS ANGIOPLASTY;  Surgeon: Gretta Lonni PARAS, MD;  Location: [facility] PV LAB;  Service: Cardiovascular;;   Basilic Vein, Subclavian Vein   WISDOM TOOTH EXTRACTION       reports that he quit smoking about 35 years ago. His smoking use included cigars. He started smoking about 50 years ago. He has never used smokeless tobacco. He reports that he does not drink alcohol  and does not use drugs.  Allergies  Allergen Reactions   Cefazolin  Dermatitis   Morphine  Other (See Comments)    Other reaction(s): Delusions (intolerance)  Other Reaction(s): delusions   Mushroom Extract Complex (Obsolete) Nausea Only    Family History  Problem Relation Age of Onset   Diabetes Mellitus II Mother    Kidney disease Mother    Diabetes Mellitus II Father    CAD Father    Cancer Father        prostate   Kidney disease Father    Diabetes Mellitus II Brother    Kidney disease Brother    Diabetes Mellitus II Brother    Stomach cancer Brother 41   Kidney disease Brother    Colon cancer Neg Hx    Colon polyps Neg Hx    Esophageal cancer Neg Hx    Rectal cancer Neg Hx    Pancreatic cancer Neg Hx     Prior to Admission medications   Medication Sig Start Date End Date Taking? Authorizing Provider  allopurinol  (ZYLOPRIM ) 100 MG tablet Take 2 tablets (200 mg total) by mouth daily. 08/08/22  Yes Angiulli, Toribio PARAS, PA-C  levothyroxine  (SYNTHROID ) 112 MCG tablet Take 112 mcg by mouth daily before breakfast.   Yes [provider]  modafinil  (PROVIGIL ) 200 MG tablet Take 1 tablet (200 mg total) by mouth daily. 12/22/23  Yes Raulkar, Sven SQUIBB, MD  pantoprazole  (PROTONIX ) 40 MG tablet Take 1 tablet (40 mg total) by mouth daily. 12/19/23  Yes [name], NP  pregabalin  (LYRICA ) 150 MG capsule Take 150 mg by mouth in the morning, at noon, in the evening, and at bedtime.   Yes [provider]  rosuvastatin  (CRESTOR ) 10 MG tablet Take 1 tablet (10 mg total) by mouth daily. 12/19/23 03/18/24 Yes [name], NP  sevelamer  carbonate (RENVELA ) 800 MG tablet Take 800 mg by mouth 3 (three)  times daily. 01/17/23  Yes [provider]    Physical Exam: Constitutional: *** Vitals:   03/16/24 1707 03/16/24 1708 03/16/24 2000 03/16/24 2030  BP: (!) 163/60  (!) 161/74 (!) 164/68  Pulse: 77  71 61  Resp: 20     Temp: 98.7 F (37.1 C)     TempSrc:  SpO2: 99%  99% 96%  Weight:  90.7 kg    Height:  5' 11 (1.803 m)     Eyes: *** ENMT: *** Neck: *** Respiratory: *** Cardiovascular: *** Abdomen: *** Musculoskeletal: *** Skin: *** Neurologic:*** Psychiatric: ***   Labs on Admission: I have personally reviewed following labs and imaging studies  CBC: Recent Labs  Lab 03/16/24 1658 03/16/24 1742  WBC 5.1  --   NEUTROABS 3.7  --   HGB 12.4* 12.6*  HCT 38.7* 37.0*  MCV 91.3  --   PLT 129*  --    Basic Metabolic Panel: Recent Labs  Lab 03/16/24 1658 03/16/24 1742  NA 140 139  K 4.2 4.2  CL 100 101  CO2 26  --   GLUCOSE 94 91  BUN 31* 32*  CREATININE 6.04* 5.90*  CALCIUM  8.3*  --    GFR: Estimated Creatinine Clearance: 12.7 mL/min (A) (by C-G formula based on SCr of 5.9 mg/dL (H)). Liver Function Tests: Recent Labs  Lab 03/16/24 1658  AST 19  ALT 11  ALKPHOS 132*  BILITOT 1.3*  PROT 5.5*  ALBUMIN  2.7*   Recent Labs  Lab 03/16/24 1658  LIPASE 50   No results for input(s): AMMONIA in the last 168 hours. Coagulation Profile: No results for input(s): INR, PROTIME in the last 168 hours. Cardiac Enzymes: No results for input(s): CKTOTAL, CKMB, CKMBINDEX, TROPONINI in the last 168 hours. BNP (last 3 results) Recent Labs    10/28/23 2109  PROBNP >35,000.0*   HbA1C: No results for input(s): HGBA1C in the last 72 hours. CBG: No results for input(s): GLUCAP in the last 168 hours. Lipid Profile: No results for input(s): CHOL, HDL, LDLCALC, TRIG, CHOLHDL, LDLDIRECT in the last 72 hours. Thyroid  Function Tests: No results for input(s): TSH, T4TOTAL, FREET4, T3FREE, THYROIDAB in the last 72  hours. Anemia Panel: No results for input(s): VITAMINB12, FOLATE, FERRITIN, TIBC, IRON, RETICCTPCT in the last 72 hours. Urine analysis:    Component Value Date/Time   COLORURINE YELLOW 07/22/2022 2356   APPEARANCEUR CLEAR 07/22/2022 2356   LABSPEC 1.022 07/22/2022 2356   PHURINE 6.5 07/22/2022 2356   GLUCOSEU NEGATIVE 07/22/2022 2356   HGBUR SMALL (A) 07/22/2022 2356   BILIRUBINUR NEGATIVE 07/22/2022 2356   KETONESUR NEGATIVE 07/22/2022 2356   PROTEINUR >300 (A) 07/22/2022 2356   UROBILINOGEN 0.2 12/28/2014 1031   NITRITE NEGATIVE 07/22/2022 2356   LEUKOCYTESUR TRACE (A) 07/22/2022 2356   Sepsis Labs: @LABRCNTIP (procalcitonin:4,lacticidven:4) ) Recent Results (from the past 240 hours)  Resp panel by RT-PCR (RSV, Flu A&B, Covid) Anterior Nasal Swab     Status: None   Collection Time: 03/16/24  4:58 PM   Specimen: Anterior Nasal Swab  Result Value Ref Range Status   SARS Coronavirus 2 by RT PCR NEGATIVE NEGATIVE Final   Influenza A by PCR NEGATIVE NEGATIVE Final   Influenza B by PCR NEGATIVE NEGATIVE Final    Comment: (NOTE) The Xpert Xpress SARS-CoV-2/FLU/RSV plus assay is intended as an aid in the diagnosis of influenza from Nasopharyngeal swab specimens and should not be used as a sole basis for treatment. Nasal washings and aspirates are unacceptable for Xpert Xpress SARS-CoV-2/FLU/RSV testing.  Fact Sheet for Patients: bloggercourse.com  Fact Sheet for Healthcare Providers: seriousbroker.it  This test is not yet approved or cleared by the [place]  FDA and has been authorized for detection and/or diagnosis of SARS-CoV-2 by FDA under an Emergency Use Authorization (EUA). This EUA will remain in effect (meaning this test can be  used) for the duration of the COVID-19 declaration under Section 564(b)(1) of the Act, 21 U.S.C. section 360bbb-3(b)(1), unless the authorization is terminated or revoked.      Resp Syncytial Virus by PCR NEGATIVE NEGATIVE Final    Comment: (NOTE) Fact Sheet for Patients: bloggercourse.com  Fact Sheet for Healthcare Providers: seriousbroker.it  This test is not yet approved or cleared by the [place]  FDA and has been authorized for detection and/or diagnosis of SARS-CoV-2 by FDA under an Emergency Use Authorization (EUA). This EUA will remain in effect (meaning this test can be used) for the duration of the COVID-19 declaration under Section 564(b)(1) of the Act, 21 U.S.C. section 360bbb-3(b)(1), unless the authorization is terminated or revoked.  Performed at [facility], [address] [place], [place] [address]      Radiological Exams on Admission: CT ABDOMEN PELVIS WO CONTRAST Result Date: 03/16/2024 EXAM: CT ABDOMEN AND PELVIS WITHOUT CONTRAST 03/16/2024 06:04:28 PM TECHNIQUE: CT of the abdomen and pelvis was performed without the administration of intravenous contrast. Multiplanar reformatted images are provided for review. Automated exposure control, iterative reconstruction, and/or weight-based adjustment of the mA/kV was utilized to reduce the radiation dose to as low as reasonably achievable. COMPARISON: 01/08/2024 CLINICAL HISTORY: abd pain, diarrhea FINDINGS: LOWER CHEST: Moderate right pleural effusion, new since prior study. Compressive atelectasis in the right lower lobe and right middle lobe. LIVER: The liver is shrunken and nodular, compatible with cirrhosis. No suspicious focal hepatic abnormality. GALLBLADDER AND BILE DUCTS: Prior cholecystectomy. No biliary ductal dilatation. SPLEEN: The spleen is at the upper limit of normal in size at 12.5 cm. PANCREAS: No acute abnormality. ADRENAL GLANDS: Right adrenal mass with areas of low density centrally compatible with adenoma measuring up to 3.3 cm, stable. The left adrenal gland is unremarkable. KIDNEYS, URETERS AND BLADDER: Kidneys are  atrophic. Right upper pole renal low density lesion measures up to 3.3 cm compared to 3.9 cm previously, likely benign cyst. Per consensus, no follow-up is needed for simple Bosniak type 1 and 2 renal cysts, unless the patient has a malignancy history or risk factors. No stones in the kidneys or ureters. No hydronephrosis. No perinephric or periureteral stranding. Urinary bladder is unremarkable. GI AND BOWEL: Stomach demonstrates no acute abnormality. Sigmoid diverticulosis. No acute diverticulitis. Clips again noted in the third portion of the duodenum, unchanged. There is no bowel obstruction. PERITONEUM AND RETROPERITONEUM: Large volume ascites is stable since the prior study. No free air. VASCULATURE: Diffuse coronary artery and aortic atherosclerosis. Aorta is normal in caliber. LYMPH NODES: No lymphadenopathy. REPRODUCTIVE ORGANS: Radiation seeds in the region of the prostate. BONES AND SOFT TISSUES: Small right inguinal hernia containing ascites. No acute osseous abnormality. No focal soft tissue abnormality. IMPRESSION: 1. Moderate right pleural effusion, new since prior study, with associated compressive atelectasis in the right lower lobe and right middle lobe. 2. Large volume ascites, stable since the prior study. 3. Liver with shrunken and nodular appearance, compatible with cirrhosis. No suspicious focal hepatic abnormality. Electronically signed by: Franky Crease MD 03/16/2024 06:31 PM EST RP Workstation: [id number]   DG Chest 1 View Result Date: 03/16/2024 CLINICAL DATA:  Cough diarrhea respiratory distress EXAM: DG CHEST 1V COMPARISON:  01/08/2024, 10/31/2023 FINDINGS: Hardware in the cervical spine. Sternotomy. Hypoventilatory changes with elevation of right diaphragm. At least small right-sided pleural effusion, possibly moderate in size. Airspace disease at the right base. Stable cardiomediastinal silhouette. Patchy airspace disease at the left base as well. IMPRESSION: Hypoventilatory changes  with  elevation of right diaphragm. At least small right-sided pleural effusion, possibly moderate in size. Airspace disease at the right base may be due to atelectasis or pneumonia. Patchy airspace opacities at left base, possible foci of pneumonia. Electronically Signed   By: Luke Bun M.D.   On: 03/16/2024 18:04    EKG: Independently reviewed. ***  Assessment/Plan Principal Problem:   Fluid overload Active Problems:   CAD (coronary artery disease)   ESRD on dialysis (HCC)   Insulin  dependent type 2 diabetes mellitus (HCC)   Anemia of chronic disease   Essential hypertension   S/P CABG (coronary artery bypass graft)   Hypothyroidism   Neuropathy (HCC)   Gout   Prostate cancer (HCC)   Cervical myelopathy (HCC)   Diarrhea    ***   DVT prophylaxis: *** Code Status: ***  Family Communication: ***  Disposition Plan: ***  Consults called: ***  Admission status: ***

## 2024-03-16 NOTE — ED Triage Notes (Signed)
 According to [facility]: Pt is coming home. Wife called ems due to respiratory distress. Main complaint is weakness and lack of apetitte for multiple days. Pt has been having diarrhea during this time. Pt has had dialysis treatment today. Pt abdomen is distended and has scheduled to be treated tomorrow. Lung sounds are rhonchi. Pt has also been lethargic.  Vitals Bp 110/70 Hr 66 RR 16 Cbg 138 Capnography 29.

## 2024-03-16 NOTE — ED Notes (Signed)
 Phlebotomy attempted to collect 2nd culture. Unable to obtain 2nd second. RN is aware

## 2024-03-16 NOTE — ED Notes (Signed)
 Patient transported to MRI

## 2024-03-17 ENCOUNTER — Encounter (HOSPITAL_BASED_OUTPATIENT_CLINIC_OR_DEPARTMENT_OTHER): Admitting: Internal Medicine

## 2024-03-17 ENCOUNTER — Inpatient Hospital Stay (HOSPITAL_COMMUNITY)

## 2024-03-17 ENCOUNTER — Inpatient Hospital Stay (HOSPITAL_COMMUNITY): Admission: RE | Admit: 2024-03-17 | Source: Ambulatory Visit

## 2024-03-17 ENCOUNTER — Other Ambulatory Visit (HOSPITAL_COMMUNITY)

## 2024-03-17 DIAGNOSIS — T8740 Infection of amputation stump, unspecified extremity: Secondary | ICD-10-CM | POA: Diagnosis not present

## 2024-03-17 DIAGNOSIS — Z992 Dependence on renal dialysis: Secondary | ICD-10-CM

## 2024-03-17 DIAGNOSIS — N186 End stage renal disease: Secondary | ICD-10-CM | POA: Diagnosis not present

## 2024-03-17 DIAGNOSIS — T8781 Dehiscence of amputation stump: Secondary | ICD-10-CM

## 2024-03-17 HISTORY — PX: IR PARACENTESIS: IMG2679

## 2024-03-17 LAB — BODY FLUID CELL COUNT WITH DIFFERENTIAL
Eos, Fluid: 1 %
Lymphs, Fluid: 54 %
Monocyte-Macrophage-Serous Fluid: 33 % — ABNORMAL LOW (ref 50–90)
Neutrophil Count, Fluid: 11 % (ref 0–25)
Other Cells, Fluid: 1 %
Total Nucleated Cell Count, Fluid: 242 uL (ref 0–1000)

## 2024-03-17 LAB — COMPREHENSIVE METABOLIC PANEL WITH GFR
ALT: 9 U/L (ref 0–44)
AST: 18 U/L (ref 15–41)
Albumin: 2.2 g/dL — ABNORMAL LOW (ref 3.5–5.0)
Alkaline Phosphatase: 111 U/L (ref 38–126)
Anion gap: 19 — ABNORMAL HIGH (ref 5–15)
BUN: 39 mg/dL — ABNORMAL HIGH (ref 8–23)
CO2: 22 mmol/L (ref 22–32)
Calcium: 8.3 mg/dL — ABNORMAL LOW (ref 8.9–10.3)
Chloride: 102 mmol/L (ref 98–111)
Creatinine, Ser: 7.35 mg/dL — ABNORMAL HIGH (ref 0.61–1.24)
GFR, Estimated: 7 mL/min — ABNORMAL LOW (ref >60)
Glucose, Bld: 101 mg/dL — ABNORMAL HIGH (ref 70–99)
Potassium: 4.7 mmol/L (ref 3.5–5.1)
Sodium: 143 mmol/L (ref 135–145)
Total Bilirubin: 1.1 mg/dL (ref 0.0–1.2)
Total Protein: 4.8 g/dL — ABNORMAL LOW (ref 6.5–8.1)

## 2024-03-17 LAB — ALBUMIN, PLEURAL OR PERITONEAL FLUID: Albumin, Fluid: <1.5 g/dL

## 2024-03-17 LAB — GRAM STAIN

## 2024-03-17 LAB — PROTEIN, PLEURAL OR PERITONEAL FLUID: Total protein, fluid: <3 g/dL

## 2024-03-17 LAB — CBC WITH DIFFERENTIAL/PLATELET
Abs Immature Granulocytes: 0.04 K/uL (ref 0.00–0.07)
Basophils Absolute: 0.1 K/uL (ref 0.0–0.1)
Basophils Relative: 2 %
Eosinophils Absolute: 0.3 K/uL (ref 0.0–0.5)
Eosinophils Relative: 6 %
HCT: 32.9 % — ABNORMAL LOW (ref 39.0–52.0)
Hemoglobin: 10.7 g/dL — ABNORMAL LOW (ref 13.0–17.0)
Immature Granulocytes: 1 %
Lymphocytes Relative: 8 %
Lymphs Abs: 0.4 K/uL — ABNORMAL LOW (ref 0.7–4.0)
MCH: 29 pg (ref 26.0–34.0)
MCHC: 32.5 g/dL (ref 30.0–36.0)
MCV: 89.2 fL (ref 80.0–100.0)
Monocytes Absolute: 0.6 K/uL (ref 0.1–1.0)
Monocytes Relative: 12 %
Neutro Abs: 3.5 K/uL (ref 1.7–7.7)
Neutrophils Relative %: 71 %
Platelets: 127 K/uL — ABNORMAL LOW (ref 150–400)
RBC: 3.69 MIL/uL — ABNORMAL LOW (ref 4.22–5.81)
RDW: 14.8 % (ref 11.5–15.5)
WBC: 4.9 K/uL (ref 4.0–10.5)
nRBC: 0 % (ref 0.0–0.2)

## 2024-03-17 LAB — PROTIME-INR
INR: 1.2 (ref 0.8–1.2)
Prothrombin Time: 15.4 s — ABNORMAL HIGH (ref 11.4–15.2)

## 2024-03-17 LAB — VITAMIN B12: Vitamin B-12: 562 pg/mL (ref 180–914)

## 2024-03-17 LAB — TROPONIN I (HIGH SENSITIVITY): Troponin I (High Sensitivity): 56 ng/L — ABNORMAL HIGH (ref <18)

## 2024-03-17 LAB — TSH: TSH: 6.414 u[IU]/mL — ABNORMAL HIGH (ref 0.350–4.500)

## 2024-03-17 LAB — CK: Total CK: 50 U/L (ref 49–397)

## 2024-03-17 LAB — AMMONIA: Ammonia: 15 umol/L (ref 9–35)

## 2024-03-17 MED ORDER — SODIUM CHLORIDE 0.9 % IV SOLN: 1.0000 g | INTRAVENOUS | Status: DC

## 2024-03-17 MED ORDER — LIDOCAINE-PRILOCAINE 2.5-2.5 % EX CREA: 1.0000 | TOPICAL_CREAM | CUTANEOUS | Status: DC | PRN

## 2024-03-17 MED ORDER — LIDOCAINE-EPINEPHRINE 1 %-1:100000 IJ SOLN
20.0000 mL | Freq: Once | INTRAMUSCULAR | Status: AC
  Administered 2024-03-17: 15 mL

## 2024-03-17 MED ORDER — PENTAFLUOROPROP-TETRAFLUOROETH EX AERO: 1.0000 | INHALATION_SPRAY | CUTANEOUS | Status: DC | PRN

## 2024-03-17 MED ORDER — ANTICOAGULANT SODIUM CITRATE 4% (200MG/5ML) IV SOLN: 5.0000 mL | Status: DC | PRN

## 2024-03-17 MED ORDER — CHLORHEXIDINE GLUCONATE CLOTH 2 % EX PADS
6.0000 | MEDICATED_PAD | Freq: Every day | CUTANEOUS | Status: DC
  Administered 2024-03-18: 6 via TOPICAL

## 2024-03-17 MED ORDER — HEPARIN SODIUM (PORCINE) 1000 UNIT/ML DIALYSIS: 1000.0000 [IU] | INTRAMUSCULAR | Status: DC | PRN

## 2024-03-17 MED ORDER — ALBUMIN HUMAN 25 % IV SOLN: 12.5000 g | Freq: Once | INTRAVENOUS | Status: DC

## 2024-03-17 MED ORDER — VANCOMYCIN HCL IN DEXTROSE 1-5 GM/200ML-% IV SOLN
1000.0000 mg | Freq: Once | INTRAVENOUS | Status: AC
  Administered 2024-03-18: 1000 mg via INTRAVENOUS
  Filled 2024-03-17: qty 200

## 2024-03-17 MED ORDER — VANCOMYCIN VARIABLE DOSE PER UNSTABLE RENAL FUNCTION (PHARMACIST DOSING): Status: DC

## 2024-03-17 MED ORDER — LIDOCAINE-EPINEPHRINE 1 %-1:100000 IJ SOLN
INTRAMUSCULAR | Status: AC
  Filled 2024-03-17: qty 1

## 2024-03-17 MED ORDER — NEPRO/CARBSTEADY PO LIQD: 237.0000 mL | ORAL | Status: DC | PRN

## 2024-03-17 MED ORDER — VANCOMYCIN HCL 2000 MG/400ML IV SOLN
2000.0000 mg | Freq: Once | INTRAVENOUS | Status: AC
  Administered 2024-03-17: 2000 mg via INTRAVENOUS
  Filled 2024-03-17: qty 400

## 2024-03-17 MED ORDER — SODIUM CHLORIDE 0.9 % IV SOLN
2.0000 g | INTRAVENOUS | Status: DC
  Administered 2024-03-17 – 2024-03-18 (×2): 2 g via INTRAVENOUS
  Filled 2024-03-17 (×2): qty 20

## 2024-03-17 MED ORDER — HYDRALAZINE HCL 10 MG PO TABS
10.0000 mg | ORAL_TABLET | Freq: Once | ORAL | Status: AC
  Administered 2024-03-17: 10 mg via ORAL
  Filled 2024-03-17: qty 1

## 2024-03-17 MED ORDER — ALTEPLASE 2 MG IJ SOLR: 2.0000 mg | Freq: Once | INTRAMUSCULAR | Status: DC | PRN

## 2024-03-17 MED ORDER — HYDRALAZINE HCL 10 MG PO TABS: 10.0000 mg | ORAL_TABLET | Freq: Four times a day (QID) | ORAL | Status: DC | PRN

## 2024-03-17 MED ORDER — LIDOCAINE HCL (PF) 1 % IJ SOLN: 5.0000 mL | INTRAMUSCULAR | Status: DC | PRN

## 2024-03-17 MED ORDER — GUAIFENESIN-DM 100-10 MG/5ML PO SYRP
5.0000 mL | ORAL_SOLUTION | ORAL | Status: DC | PRN
  Administered 2024-03-17 – 2024-03-18 (×3): 5 mL via ORAL
  Filled 2024-03-17 (×4): qty 5

## 2024-03-17 MED ORDER — LEVOTHYROXINE SODIUM 25 MCG PO TABS
125.0000 ug | ORAL_TABLET | Freq: Every day | ORAL | Status: DC
  Administered 2024-03-18: 125 ug via ORAL
  Filled 2024-03-17: qty 1

## 2024-03-17 NOTE — Progress Notes (Signed)
 Pt receives out-pt HD at [facility] on [facility] on TTS 7:00 am chair time. Will assist as needed.    Waleed Dettman Dialysis Navigator [phone]

## 2024-03-17 NOTE — Progress Notes (Signed)
 PROGRESS NOTE    [name]  FMW:[id number] DOB: 22-Jan-1951 DOA: 03/16/2024 PCP: [name], NP   Brief Narrative:  HPI: [name] is a 73 y.o. male with history of CAD status post CABG, diabetes mellitus type 2, ESRD on hemodialysis Tuesday Thursday Saturday, history of atrial flutter status post ablation on December 2021 now in permanent A-fib unable to tolerate anticoagulation due to severe GI bleed, bilateral BKA, cirrhosis of the liver secondary to [facility] patient is to the ER because of increasing weakness fatigue and over the last 2 to 3 days has multiple episodes of watery diarrhea.  Denies chest pain productive cough fever or chills.  Has been having poor appetite.   Patient was admitted in October 2025 for right leg septic bursitis of the right knee was on antibiotics until recently last month.   ED Course: In the ER patient is afebrile.  CT abdomen and pelvis shows large ascites and also moderate right pleural effusion.  Labs show WBC of 5.1 hemoglobin 12.4 albumin  of 2.7.  COVID and flu test negative.  EKG shows A-fib rate controlled.  On exam patient's left stump of the below-knee amputation appears erythematous and also has a small skin bleb which is has some discharge.  Right stump looks mildly erythematous.  Patient admitted for fluid overload likely from third spacing increasing fatigue and weakness could be from deconditioning.  Assessment & Plan:   Principal Problem:   Fluid overload Active Problems:   CAD (coronary artery disease)   ESRD on dialysis (HCC)   Insulin  dependent type 2 diabetes mellitus (HCC)   Anemia of chronic disease   Essential hypertension   S/P CABG (coronary artery bypass graft)   Hypothyroidism   Neuropathy (HCC)   Gout   Prostate cancer (HCC)   Cervical myelopathy (HCC)   Anemia of chronic renal failure   Anemia due to chronic blood loss   Diarrhea   Amputation stump infection (HCC)  Fluid overload likely third spacing in the  setting of cirrhosis of the liver, patient was scheduled to have paracentesis done as outpatient however, that is now ordered here for him.  INR normal.  I have consulted nephrology this morning. Generalized weakness fatigue suspect likely from deconditioning.  Hypoxia on VBG, B12 and ammonia normal.  Continue modafinil . Diarrhea C. difficile and GI pathogen panel pending. Possible infection of the stump of below-knee amputation with recent history of septic bursitis.  Stump looks erythematous on the right side and there is a small skin blebs with some discharge on the left stump.  MRI shows seroma on the right knee but no abscess.  Unfortunately, there is no comment about the left knee on the MRI report by radiology.  Clinically he does appear to have infection so I have reached out to Dr. Harden via secure chat requesting to see this gentleman since Dr. Harden had done the surgeries in the past. CAD status post CABG denied any chest pain.  Given the weakness, admitting hospitalist decided to check troponins which are elevated but flat and stable compared to previously checked many years ago as well. ESRD on hemodialysis on Tuesday Thursday Saturday.  Nephrology consulted.  Consult verified back by Dr. Geralynn. Hypertension not on any antihypertensives.  Follow blood pressure trends as needed IV hydralazine  for now. Anemia of chronic disease: Likely from renal disease follow CBC.  Currently stable. History of permanent A-fib presently rate controlled.  Not on anticoagulation secondary to history of GI bleed. Hypothyroidism  on Synthroid  125 mcg.  TSH slightly elevated at 6.4.  Increasing Synthroid  to 150 mcg starting tomorrow. Neuropathy on Lyrica .  Confirmed on PDMP website. Gout on allopurinol . Cervical myelopathy.  DVT prophylaxis: SCD   Code Status: Full Code  Family Communication:  None present at bedside.  Plan of care discussed with patient in length and he/she verbalized understanding and agreed  with it.  Status is: Inpatient Remains inpatient appropriate because: Left stump infection.  Will need to be seen by Dr. Harden, PT OT.   Estimated body mass index is 27.89 kg/m as calculated from the following:   Height as of this encounter: 5' 11 (1.803 m).   Weight as of this encounter: 90.7 kg.    Nutritional Assessment: Body mass index is 27.89 kg/m.SABRA Seen by dietician.  I agree with the assessment and plan as outlined below: Nutrition Status:        . Skin Assessment: I have examined the patient's skin and I agree with the wound assessment as performed by the wound care RN as outlined below:    Consultants:  Orthopedics and IR  Procedures:  As above  Antimicrobials:  Anti-infectives (From admission, onward)    Start     Dose/Rate Route Frequency Ordered Stop   03/17/24 0645  vancomycin  (VANCOREADY) IVPB 2000 mg/400 mL        2,000 mg 200 mL/hr over 120 Minutes Intravenous  Once 03/17/24 9367     03/17/24 9367  vancomycin  variable dose per unstable renal function (pharmacist dosing)         Does not apply See admin instructions 03/17/24 9367     03/17/24 0630  cefTRIAXone  (ROCEPHIN ) 1 g in sodium chloride  0.9 % 100 mL IVPB  Status:  Discontinued        1 g 200 mL/hr over 30 Minutes Intravenous Every 24 hours 03/17/24 9376 03/17/24 9376         Subjective: Patient seen and examined in the ED, nurse at the bedside who witnessed the whole encounter.  Patient complains of not feeling well but did not have any specific complaint.  He kept talking about stump infection.  I kept telling him repeatedly that the MRI report was not back when I saw him.  He was reassured that he is on appropriate antibiotics and that I plan to reach out to Dr. Harden.  Objective: Vitals:   03/17/24 0529 03/17/24 0600 03/17/24 0615 03/17/24 0800  BP: (!) 180/85 (!) 128/55  (!) 140/125  Pulse:  62 67 83  Resp:   15 (!) 22  Temp:      TempSrc:      SpO2:  97% 98% 93%  Weight:       Height:       No intake or output data in the 24 hours ending 03/17/24 0829 Filed Weights   03/16/24 1708  Weight: 90.7 kg    Examination:  General exam: Appears calm and comfortable  Respiratory system: Clear to auscultation. Respiratory effort normal. Cardiovascular system: S1 & S2 heard, RRR. No JVD, murmurs, rubs, gallops or clicks. No pedal edema. Gastrointestinal system: Abdomen is nondistended, soft and nontender. No organomegaly or masses felt. Normal bowel sounds heard. Central nervous system: Alert and oriented. No focal neurological deficits. Extremities: Bilateral BKA, purulent discharge from the left stump. Psychiatry: Judgement and insight appear normal. Mood & affect appropriate.    Data Reviewed: I have personally reviewed following labs and imaging studies  CBC: Recent Labs  Lab 03/16/24  1658 03/16/24 1742 03/16/24 2309  WBC 5.1  --  5.3  NEUTROABS 3.7  --   --   HGB 12.4* 12.6* 12.0*  HCT 38.7* 37.0* 38.0*  MCV 91.3  --  90.5  PLT 129*  --  130*   Basic Metabolic Panel: Recent Labs  Lab 03/16/24 1658 03/16/24 1742 03/16/24 2309  NA 140 139 139  K 4.2 4.2 4.4  CL 100 101 99  CO2 26  --  23  GLUCOSE 94 91 88  BUN 31* 32* 32*  CREATININE 6.04* 5.90* 6.32*  CALCIUM  8.3*  --  8.2*   GFR: Estimated Creatinine Clearance: 11.8 mL/min (A) (by C-G formula based on SCr of 6.32 mg/dL (H)). Liver Function Tests: Recent Labs  Lab 03/16/24 1658 03/16/24 2309  AST 19 23  ALT 11 12  ALKPHOS 132* 129*  BILITOT 1.3* 1.3*  PROT 5.5* 5.5*  ALBUMIN  2.7* 2.7*   Recent Labs  Lab 03/16/24 1658  LIPASE 50   Recent Labs  Lab 03/17/24 0656  AMMONIA 15   Coagulation Profile: Recent Labs  Lab 03/17/24 0656  INR 1.2   Cardiac Enzymes: Recent Labs  Lab 03/17/24 0656  CKTOTAL 50   BNP (last 3 results) Recent Labs    10/28/23 2109  PROBNP >35,000.0*   HbA1C: No results for input(s): HGBA1C in the last 72 hours. CBG: No results for  input(s): GLUCAP in the last 168 hours. Lipid Profile: No results for input(s): CHOL, HDL, LDLCALC, TRIG, CHOLHDL, LDLDIRECT in the last 72 hours. Thyroid  Function Tests: Recent Labs    03/16/24 2309  TSH 6.414*   Anemia Panel: Recent Labs    03/17/24 0656  VITAMINB12 562   Sepsis Labs: Recent Labs  Lab 03/16/24 1742  LATICACIDVEN 1.5    Recent Results (from the past 240 hours)  Resp panel by RT-PCR (RSV, Flu A&B, Covid) Anterior Nasal Swab     Status: None   Collection Time: 03/16/24  4:58 PM   Specimen: Anterior Nasal Swab  Result Value Ref Range Status   SARS Coronavirus 2 by RT PCR NEGATIVE NEGATIVE Final   Influenza A by PCR NEGATIVE NEGATIVE Final   Influenza B by PCR NEGATIVE NEGATIVE Final    Comment: (NOTE) The Xpert Xpress SARS-CoV-2/FLU/RSV plus assay is intended as an aid in the diagnosis of influenza from Nasopharyngeal swab specimens and should not be used as a sole basis for treatment. Nasal washings and aspirates are unacceptable for Xpert Xpress SARS-CoV-2/FLU/RSV testing.  Fact Sheet for Patients: bloggercourse.com  Fact Sheet for Healthcare Providers: seriousbroker.it  This test is not yet approved or cleared by the [place]  FDA and has been authorized for detection and/or diagnosis of SARS-CoV-2 by FDA under an Emergency Use Authorization (EUA). This EUA will remain in effect (meaning this test can be used) for the duration of the COVID-19 declaration under Section 564(b)(1) of the Act, 21 U.S.C. section 360bbb-3(b)(1), unless the authorization is terminated or revoked.     Resp Syncytial Virus by PCR NEGATIVE NEGATIVE Final    Comment: (NOTE) Fact Sheet for Patients: bloggercourse.com  Fact Sheet for Healthcare Providers: seriousbroker.it  This test is not yet approved or cleared by the [place]  FDA and has been  authorized for detection and/or diagnosis of SARS-CoV-2 by FDA under an Emergency Use Authorization (EUA). This EUA will remain in effect (meaning this test can be used) for the duration of the COVID-19 declaration under Section 564(b)(1) of the Act, 21 U.S.C. section 360bbb-3(b)(1),  unless the authorization is terminated or revoked.  Performed at [facility], [address], [place], [place] [address]   Culture, blood (Routine X 2) w Reflex to ID Panel     Status: None (Preliminary result)   Collection Time: 03/16/24  9:08 PM   Specimen: BLOOD RIGHT ARM  Result Value Ref Range Status   Specimen Description BLOOD RIGHT ARM  Final   Special Requests   Final    BOTTLES DRAWN AEROBIC AND ANAEROBIC Blood Culture adequate volume   Culture   Final    NO GROWTH < 12 HOURS Performed at [facility], [address], [place], [place] [address]    Report Status PENDING  Incomplete  Culture, blood (Routine X 2) w Reflex to ID Panel     Status: None (Preliminary result)   Collection Time: 03/16/24  9:13 PM   Specimen: BLOOD RIGHT ARM  Result Value Ref Range Status   Specimen Description BLOOD RIGHT ARM  Final   Special Requests   Final    BOTTLES DRAWN AEROBIC AND ANAEROBIC Blood Culture adequate volume   Culture   Final    NO GROWTH < 12 HOURS Performed at [facility], [address], [place], [place] [address]    Report Status PENDING  Incomplete     Radiology Studies: CT ABDOMEN PELVIS WO CONTRAST Result Date: 03/16/2024 EXAM: CT ABDOMEN AND PELVIS WITHOUT CONTRAST 03/16/2024 06:04:28 PM TECHNIQUE: CT of the abdomen and pelvis was performed without the administration of intravenous contrast. Multiplanar reformatted images are provided for review. Automated exposure control, iterative reconstruction, and/or weight-based adjustment of the mA/kV was utilized to reduce the radiation dose to as low as reasonably achievable. COMPARISON: 01/08/2024 CLINICAL HISTORY: abd pain,  diarrhea FINDINGS: LOWER CHEST: Moderate right pleural effusion, new since prior study. Compressive atelectasis in the right lower lobe and right middle lobe. LIVER: The liver is shrunken and nodular, compatible with cirrhosis. No suspicious focal hepatic abnormality. GALLBLADDER AND BILE DUCTS: Prior cholecystectomy. No biliary ductal dilatation. SPLEEN: The spleen is at the upper limit of normal in size at 12.5 cm. PANCREAS: No acute abnormality. ADRENAL GLANDS: Right adrenal mass with areas of low density centrally compatible with adenoma measuring up to 3.3 cm, stable. The left adrenal gland is unremarkable. KIDNEYS, URETERS AND BLADDER: Kidneys are atrophic. Right upper pole renal low density lesion measures up to 3.3 cm compared to 3.9 cm previously, likely benign cyst. Per consensus, no follow-up is needed for simple Bosniak type 1 and 2 renal cysts, unless the patient has a malignancy history or risk factors. No stones in the kidneys or ureters. No hydronephrosis. No perinephric or periureteral stranding. Urinary bladder is unremarkable. GI AND BOWEL: Stomach demonstrates no acute abnormality. Sigmoid diverticulosis. No acute diverticulitis. Clips again noted in the third portion of the duodenum, unchanged. There is no bowel obstruction. PERITONEUM AND RETROPERITONEUM: Large volume ascites is stable since the prior study. No free air. VASCULATURE: Diffuse coronary artery and aortic atherosclerosis. Aorta is normal in caliber. LYMPH NODES: No lymphadenopathy. REPRODUCTIVE ORGANS: Radiation seeds in the region of the prostate. BONES AND SOFT TISSUES: Small right inguinal hernia containing ascites. No acute osseous abnormality. No focal soft tissue abnormality. IMPRESSION: 1. Moderate right pleural effusion, new since prior study, with associated compressive atelectasis in the right lower lobe and right middle lobe. 2. Large volume ascites, stable since the prior study. 3. Liver with shrunken and nodular  appearance, compatible with cirrhosis. No suspicious  focal hepatic abnormality. Electronically signed by: Franky Crease MD 03/16/2024 06:31 PM EST RP Workstation: [id number]   DG Chest 1 View Result Date: 03/16/2024 CLINICAL DATA:  Cough diarrhea respiratory distress EXAM: DG CHEST 1V COMPARISON:  01/08/2024, 10/31/2023 FINDINGS: Hardware in the cervical spine. Sternotomy. Hypoventilatory changes with elevation of right diaphragm. At least small right-sided pleural effusion, possibly moderate in size. Airspace disease at the right base. Stable cardiomediastinal silhouette. Patchy airspace disease at the left base as well. IMPRESSION: Hypoventilatory changes with elevation of right diaphragm. At least small right-sided pleural effusion, possibly moderate in size. Airspace disease at the right base may be due to atelectasis or pneumonia. Patchy airspace opacities at left base, possible foci of pneumonia. Electronically Signed   By: Luke Bun M.D.   On: 03/16/2024 18:04    Scheduled Meds:  allopurinol   200 mg Oral Daily   levothyroxine   112 mcg Oral Q0600   modafinil   200 mg Oral Daily   pantoprazole   40 mg Oral Daily   pregabalin   150 mg Oral TID   rosuvastatin   10 mg Oral Daily   sevelamer  carbonate  800 mg Oral TID   vancomycin  variable dose per unstable renal function (pharmacist dosing)   Does not apply See admin instructions   Continuous Infusions:  vancomycin  2,000 mg (03/17/24 0655)     LOS: 1 day   Fredia Skeeter, MD [facility]  03/17/2024, 8:29 AM   *Please note that this is a verbal dictation therefore any spelling or grammatical errors are due to the Dragon Medical One system interpretation.  Please page via Amion and do not message via secure chat for urgent patient care matters. Secure chat can be used for non urgent patient care matters.  How to contact the [facility] Attending or Consulting provider 7A - 7P or covering provider during after hours 7P -7A, for this  patient?  Check the care team in [facility] and look for a) attending/consulting [facility] provider listed and b) the [facility] team listed. Page or secure chat 7A-7P. Log into [email or web address] and use 's universal password to access. If you do not have the password, please contact the hospital operator. Locate the [facility] provider you are looking for under [facility] and page to a number that you can be directly reached. If you still have difficulty reaching the provider, please page the [facility] (Director on Call) for the Hospitalists listed on amion for assistance.

## 2024-03-17 NOTE — ED Notes (Signed)
 Pt sitting in the bed eating breakfast. Pt advised IR will be coming to get him for his paracentesis.

## 2024-03-17 NOTE — ED Notes (Signed)
 Pt transported to IR

## 2024-03-17 NOTE — Progress Notes (Signed)
 Pharmacy Antibiotic Note  ABDELAZIZ WESTENBERGER is a 73 y.o. male admitted on 03/16/2024 with weakness/cough.  Pharmacy has been consulted for vancomycin  dosing for cellulitis. PMH includes right knee septic bursitis with fluid culture growing MSSA.  -WBC WNL, afebrile, ESRD (on HD) -Blood cultures collected -CT: new R. Pleural effusion  Plan: -Vancomycin  2g IV x1 -Vancomycin  variable dosing until inpatient HD schedule set, then 1g IV after every HD session -Follow up signs of clinical improvement, LOT, de-escalation of antibiotics   Height: 5' 11 (180.3 cm) Weight: 90.7 kg (200 lb) IBW/kg (Calculated) : 75.3  Temp (24hrs), Avg:98.4 F (36.9 C), Min:97.9 F (36.6 C), Max:98.7 F (37.1 C)  Recent Labs  Lab 03/16/24 1658 03/16/24 1742 03/16/24 2309  WBC 5.1  --  5.3  CREATININE 6.04* 5.90* 6.32*  LATICACIDVEN  --  1.5  --     Estimated Creatinine Clearance: 11.8 mL/min (A) (by C-G formula based on SCr of 6.32 mg/dL (H)).    Allergies  Allergen Reactions   Cefazolin  Dermatitis   Morphine  Other (See Comments)    Other reaction(s): Delusions (intolerance)  Other Reaction(s): delusions   Mushroom Extract Complex (Obsolete) Nausea Only    Antimicrobials this admission: Vancomycin  12/10 >>   Microbiology results: 12/9 BCx:   Thank you for allowing pharmacy to be a part of this patients care.  Lynwood Poplar, PharmD, BCPS Clinical Pharmacist 03/17/2024 6:29 AM

## 2024-03-17 NOTE — Consult Note (Signed)
 WOC Nurse Consult Note: Reason for Consult: Nonintact lesions to left BKA stump.  Has caused inability to tolerate prosthesis.  Right BKA as well.  Lives at home.  On dialysis.  Just had paracentesis this AM. Shortness of breath and diarrhea brought patient in.  Noted fluid overload likely from third spacing increasing fatigue and weakness could be from deconditioning.  Wound type: Fluid overload creating skin breakdown and use of prosthesis.  Pressure Injury POA: Yes (Device related pressure and fluid)  Measurement:  Scattered nonintact lesions to left BKA stump site.  Erythema and edema to bilateral BKA sites.     Wound bed: Left stump wound:  pale nongranulating  Drainage (amount, consistency, odor)  minimal serosanguinous  no odor   Periwound:  Edema and erythema bilaterally   Dressing procedure/placement/frequency: Cleanse wound to left BKA stump with NS and pat dry. Apply alginate (LAWSON # [id number]) to open areas.  Cover with gauze and kerlix/tape.  Change daily.  Will not follow at this time.  Please re-consult if needed.  Darice Cooley MSN, RN, FNP-BC CWON Wound, Ostomy, Continence Nurse Outpatient [facility] [phone] Work cell phone:  [phone]

## 2024-03-17 NOTE — Consult Note (Signed)
 Renal Service Consult Note [facility] [name], MD  Patient: [name] Date: 03/17/2024 Requesting Physician: Dr. FABIENE Skeeter  Reason for Consult: ESRD pt w/  HPI: The patient is a 73 y.o. year-old w/ PMH as below who presented to ED yesterday evening c/o resp distress, gen'd weakness and lack of appetite. Abdominal distension and some diarrhea. Lethargic. In ED BP 168/ 63HR 80, RR 15, temp 98. 98% on RA.  K+ 4.4, BUN 32, creat 6.3, alb 2.7, LA 1.5, Hb 12, WBC 5K.  EKG showed atrial fib, controlled rate. L stump was erythematous. Pt appeared fluid overloaded w/ edema, ascites. Pt was admitted for fluid overload, acute/ chronic. Stool studies to be sent, possible L stump infection/ cellulitis. We are asked to see for dialysis.    Pt seen in room. Refuses to eat the renal diet, demands a regular diet. Wants to know how come his belly keeps getting filled with fluids. Several other complaints.    ROS - denies CP, no joint pain, no HA, no blurry vision, no rash, no diarrhea, no nausea/ vomiting   Past Medical History  Past Medical History:  Diagnosis Date   Acute osteomyelitis of metatarsal bone of left foot (HCC)    Ambulates with cane    straight cane   Anemia    Cervical myelopathy (HCC) 02/06/2018   Chronic kidney disease    dailysis M [facility] F- home   Community acquired pneumonia of left lower lobe of lung 08/18/2021   Complication of anesthesia    Coronary artery disease    Dehiscence of amputation stump of left lower extremity (HCC)    Diabetes mellitus without complication (HCC)    type2   Diabetic foot ulcer (HCC)    Diabetic neuropathy (HCC) 02/06/2018   Dysrhythmia    Gait abnormality 08/24/2018   GERD (gastroesophageal reflux disease)     01/06/20- not current   Gout    History of blood transfusion    History of blood transfusion    History of kidney stones    passed stones   Hypercholesteremia    Hypertension    Hypothyroidism     Neuromuscular disorder (HCC)    neuropathy left leg and bilateral feet   Neuropathy    Partial nontraumatic amputation of foot, 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   PONV (postoperative nausea and vomiting)    Prostate cancer (HCC)    PVD (peripheral vascular disease)    with amputations   Past Surgical History  Past Surgical History:  Procedure Laterality Date   A-FLUTTER ABLATION N/A 04/06/2020   Procedure: A-FLUTTER ABLATION;  Surgeon: Waddell Danelle ORN, MD;  Location: [facility] INVASIVE CV LAB;  Service: Cardiovascular;  Laterality: N/A;   A/V FISTULAGRAM N/A 03/05/2023   Procedure: A/V Fistulagram;  Surgeon: Pearline [name] RAMAN, MD;  Location: [facility] INVASIVE CV LAB;  Service: Vascular;  Laterality: N/A;   A/V SHUNT INTERVENTION Left 11/07/2023   Procedure: A/V SHUNT INTERVENTION;  Surgeon: Gretta Lonni PARAS, MD;  Location: [facility] PV LAB;  Service: Cardiovascular;  Laterality: Left;   AMPUTATION Left 12/25/2013   Procedure: AMPUTATION RAY LEFT 5TH RAY;  Surgeon: Jerona Harden GAILS, MD;  Location: [facility] ORS;  Service: Orthopedics;  Laterality: Left;   AMPUTATION Right 12/15/2018   Procedure: AMPUTATION OF 4TH AND 5TH TOES RIGHT FOOT;  Surgeon: Harden Jerona GAILS, MD;  Location: [facility] OR;  Service: Orthopedics;  Laterality: Right;   AMPUTATION Left 02/02/2021   Procedure: LEFT FOOT 4TH RAY AMPUTATION;  Surgeon:  Harden Jerona GAILS, MD;  Location: [facility] OR;  Service: Orthopedics;  Laterality: Left;   AMPUTATION Left 05/09/2021   Procedure: LEFT BELOW KNEE AMPUTATION;  Surgeon: Harden Jerona GAILS, MD;  Location: [facility] OR;  Service: Orthopedics;  Laterality: Left;   AMPUTATION Right 07/26/2022   Procedure: RIGHT BELOW KNEE AMPUTATION;  Surgeon: Harden Jerona GAILS, MD;  Location: [facility] OR;  Service: Orthopedics;  Laterality: Right;   APPLICATION OF WOUND VAC Right 12/15/2018   Procedure: APPLICATION OF WOUND VAC;  Surgeon: Harden Jerona GAILS, MD;  Location: [facility] OR;  Service: Orthopedics;  Laterality: Right;   AV FISTULA PLACEMENT Left 03/25/2019   Procedure:  LEFT ARM ARTERIOVENOUS (AV) FISTULA CREATION;  Surgeon: Serene Gaile ORN, MD;  Location: [facility] OR;  Service: Vascular;  Laterality: Left;   BACK SURGERY     BASCILIC VEIN TRANSPOSITION Left 05/20/2019   Procedure: SECOND STAGE LEFT BASCILIC VEIN TRANSPOSITION;  Surgeon: Serene Gaile ORN, MD;  Location: [facility] OR;  Service: Vascular;  Laterality: Left;   BIOPSY  06/22/2022   Procedure: BIOPSY;  Surgeon: Wilhelmenia Aloha Raddle., MD;  Location: [facility] ENDOSCOPY;  Service: Gastroenterology;;   CARDIAC CATHETERIZATION  02/17/2014   CATARACT EXTRACTION Bilateral    CHOLECYSTECTOMY     COLONOSCOPY  2011   in [place], Normal   COLONOSCOPY N/A 06/23/2022   Procedure: COLONOSCOPY;  Surgeon: Legrand Victory LITTIE DOUGLAS, MD;  Location: [facility] ENDOSCOPY;  Service: Gastroenterology;  Laterality: N/A;   CORONARY ARTERY BYPASS GRAFT  2008   CYSTOSCOPY N/A 06/29/2020   Procedure: CYSTOSCOPY WITH CLOT EVACUATION AND  FULGERATION;  Surgeon: Matilda Senior, MD;  Location: [facility] OR;  Service: Urology;  Laterality: N/A;   CYSTOSCOPY WITH FULGERATION Bilateral 06/17/2020   Procedure: CYSTOSCOPY,BILATERAL RETROGRADE, CLOT EVACUATION WITH FULGERATION OF THE BLADDER;  Surgeon: Selma Donnice SAUNDERS, MD;  Location: [facility] OR;  Service: Urology;  Laterality: Bilateral;   CYSTOSCOPY WITH FULGERATION N/A 06/28/2020   Procedure: CYSTOSCOPY WITH CLOT EVACUATION AND FULGERATION OF BLEEDERS;  Surgeon: Matilda Senior, MD;  Location: [facility] OR;  Service: Urology;  Laterality: N/A;  1 HR   ENTEROSCOPY N/A 06/22/2022   Procedure: ENTEROSCOPY;  Surgeon: Wilhelmenia Aloha Raddle., MD;  Location: [facility] ENDOSCOPY;  Service: Gastroenterology;  Laterality: N/A;   HEMOSTASIS CLIP PLACEMENT  06/22/2022   Procedure: HEMOSTASIS CLIP PLACEMENT;  Surgeon: Wilhelmenia Aloha Raddle., MD;  Location: [facility] ENDOSCOPY;  Service: Gastroenterology;;   HOT HEMOSTASIS N/A 06/22/2022   Procedure: HOT HEMOSTASIS (ARGON PLASMA COAGULATION/BICAP);  Surgeon: Wilhelmenia Aloha Raddle., MD;   Location: [facility] ENDOSCOPY;  Service: Gastroenterology;  Laterality: N/A;   I & D EXTREMITY Right 12/15/2018   Procedure: DEBRIDEMENT RIGHT FOOT;  Surgeon: Harden Jerona GAILS, MD;  Location: [facility] OR;  Service: Orthopedics;  Laterality: Right;   I & D EXTREMITY Right 08/27/2019   Procedure: PARTIAL CUBOID EXCISION RIGHT FOOT;  Surgeon: Harden Jerona GAILS, MD;  Location: [facility] OR;  Service: Orthopedics;  Laterality: Right;   I & D EXTREMITY Right 01/07/2020   Procedure: RIGHT FOOT EXCISION INFECTED BONE;  Surgeon: Harden Jerona GAILS, MD;  Location: [facility] OR;  Service: Orthopedics;  Laterality: Right;   I & D EXTREMITY Right 05/17/2022   Procedure: IRRIGATION AND DEBRIDEMENT RIGHT FOOT ABSCESS;  Surgeon: Harden Jerona GAILS, MD;  Location: [facility] OR;  Service: Orthopedics;  Laterality: Right;   IR PARACENTESIS  10/11/2022   IR PARACENTESIS  12/24/2022   IR PARACENTESIS  02/06/2023   IR PARACENTESIS  05/05/2023   IR PARACENTESIS  05/19/2023   IR PARACENTESIS  07/11/2023   IR PARACENTESIS  07/30/2023   IR PARACENTESIS  08/20/2023   IR PARACENTESIS  09/19/2023   IR PARACENTESIS  10/03/2023   IR PARACENTESIS  10/17/2023   IR PARACENTESIS  10/30/2023   IR PARACENTESIS  11/19/2023   IR PARACENTESIS  12/03/2023   IR PARACENTESIS  12/16/2023   IR PARACENTESIS  01/12/2024   IR PARACENTESIS  02/06/2024   IR PARACENTESIS  02/20/2024   IR PARACENTESIS  03/03/2024   IR PARACENTESIS  03/17/2024   NECK SURGERY     novemver 2019   PERIPHERAL VASCULAR BALLOON ANGIOPLASTY Left 03/05/2023   Procedure: PERIPHERAL VASCULAR BALLOON ANGIOPLASTY;  Surgeon: Pearline [name] RAMAN, MD;  Location: [facility] INVASIVE CV LAB;  Service: Vascular;  Laterality: Left;  AVF   POLYPECTOMY  06/22/2022   Procedure: POLYPECTOMY;  Surgeon: Mansouraty, Aloha Raddle., MD;  Location: [facility] ENDOSCOPY;  Service: Gastroenterology;;   POLYPECTOMY  06/23/2022   Procedure: POLYPECTOMY;  Surgeon: Legrand Victory LITTIE DOUGLAS, MD;  Location: [facility] ENDOSCOPY;  Service: Gastroenterology;;   STUMP REVISION  Left 06/27/2021   Procedure: REVISION LEFT BELOW KNEE AMPUTATION;  Surgeon: Harden Jerona GAILS, MD;  Location: [facility] OR;  Service: Orthopedics;  Laterality: Left;   SUBMUCOSAL LIFTING INJECTION  06/22/2022   Procedure: SUBMUCOSAL LIFTING INJECTION;  Surgeon: Wilhelmenia Aloha Raddle., MD;  Location: [facility] ENDOSCOPY;  Service: Gastroenterology;;   SUBMUCOSAL TATTOO INJECTION  06/22/2022   Procedure: SUBMUCOSAL TATTOO INJECTION;  Surgeon: Wilhelmenia Aloha Raddle., MD;  Location: [facility] ENDOSCOPY;  Service: Gastroenterology;;   TRANSURETHRAL RESECTION OF PROSTATE N/A 06/28/2020   Procedure: TRANSURETHRAL RESECTION OF THE PROSTATE (TURP);  Surgeon: Matilda Senior, MD;  Location: [facility] OR;  Service: Urology;  Laterality: N/A;   VENOUS ANGIOPLASTY  11/07/2023   Procedure: VENOUS ANGIOPLASTY;  Surgeon: Gretta Lonni PARAS, MD;  Location: [facility] PV LAB;  Service: Cardiovascular;;  Basilic Vein, Subclavian Vein   WISDOM TOOTH EXTRACTION     Family History  Family History  Problem Relation Age of Onset   Diabetes Mellitus II Mother    Kidney disease Mother    Diabetes Mellitus II Father    CAD Father    Cancer Father        prostate   Kidney disease Father    Diabetes Mellitus II Brother    Kidney disease Brother    Diabetes Mellitus II Brother    Stomach cancer Brother 38   Kidney disease Brother    Colon cancer Neg Hx    Colon polyps Neg Hx    Esophageal cancer Neg Hx    Rectal cancer Neg Hx    Pancreatic cancer Neg Hx    Social History  reports that he quit smoking about 35 years ago. His smoking use included cigars. He started smoking about 50 years ago. He has never used smokeless tobacco. He reports that he does not drink alcohol  and does not use drugs. Allergies  Allergies  Allergen Reactions   Cefazolin  Dermatitis   Morphine  Other (See Comments)    Other reaction(s): Delusions (intolerance)  Other Reaction(s): delusions   Mushroom Extract Complex (Obsolete) Nausea Only   Home medications Prior  to Admission medications   Medication Sig Start Date End Date Taking? Authorizing Provider  allopurinol  (ZYLOPRIM ) 100 MG tablet Take 2 tablets (200 mg total) by mouth daily. 08/08/22  Yes Angiulli, Toribio PARAS, PA-C  levothyroxine  (SYNTHROID ) 112 MCG tablet Take 112 mcg by mouth daily before breakfast.   Yes [provider]  modafinil  (PROVIGIL ) 200 MG tablet Take 1  tablet (200 mg total) by mouth daily. 12/22/23  Yes Raulkar, Sven SQUIBB, MD  pantoprazole  (PROTONIX ) 40 MG tablet Take 1 tablet (40 mg total) by mouth daily. 12/19/23  Yes [name], NP  pregabalin  (LYRICA ) 150 MG capsule Take 150 mg by mouth in the morning, at noon, in the evening, and at bedtime.   Yes [provider]  rosuvastatin  (CRESTOR ) 10 MG tablet Take 1 tablet (10 mg total) by mouth daily. 12/19/23 03/18/24 Yes [name], NP  sevelamer  carbonate (RENVELA ) 800 MG tablet Take 800 mg by mouth 3 (three) times daily. 01/17/23  Yes [provider]     Vitals:   03/17/24 0918 03/17/24 0949 03/17/24 1130 03/17/24 1200  BP: (!) 174/68  (!) 176/74 (!) 179/95  Pulse:   64 72  Resp:   (!) 22 (!) 22  Temp:  (!) 97.5 F (36.4 C)    TempSrc:  Axillary    SpO2:   100% 97%  Weight:      Height:       Exam Gen alert, no distress, on RA Sclera anicteric, throat clear  No jvd or bruits Chest clear bilat to bases RRR no MRG Abd soft ntnd 1-2+ ascites +bs Ext diffuse 1-2+ bilat hip, bilat BKA  Neuro is alert, Ox 3 , nf    LUA AVF+bruit   Home bp meds: None    OP HD: TTS [facility] From oct 2025 -> 3h   B400  84.6kg   2K bath AVF  Hep none    Assessment/ Plan: Fatigue/ gen weakness: not sure cause, has chronic vol overload requiring LVP every 2 wks Diarrhea: per pmd Volume overload/ ascites: pt has chronic volume overload. Going for LVP this am w/ IR. 3.8 L removed. More vol off w/ HD tonight/  tomorrow.   ESRD: on HD TTS. Not acutely ill. Plan is for HD today/ tonight as staffing  allows.  BP: bp's high, not on bp lowering meds at home it appears. Plan HD w/ max UF as tolerated.  Anemia of esrd: Hb 12, follow.        Myer Fret  MD [facility] 03/17/2024, 12:54 PM  Recent Labs  Lab 03/16/24 1658 03/16/24 1742 03/16/24 2309  HGB 12.4* 12.6* 12.0*  ALBUMIN  2.7*  --  2.7*  CALCIUM  8.3*  --  8.2*  CREATININE 6.04* 5.90* 6.32*  K 4.2 4.2 4.4   Inpatient medications:  allopurinol   200 mg Oral Daily   [START ON 03/18/2024] levothyroxine   125 mcg Oral Q0600   modafinil   200 mg Oral Daily   pantoprazole   40 mg Oral Daily   pregabalin   150 mg Oral TID   rosuvastatin   10 mg Oral Daily   sevelamer  carbonate  800 mg Oral TID   vancomycin  variable dose per unstable renal function (pharmacist dosing)   Does not apply See admin instructions    albumin  human     cefTRIAXone  (ROCEPHIN )  IV     acetaminophen  **OR** acetaminophen , hydrALAZINE 

## 2024-03-17 NOTE — Consult Note (Signed)
 ORTHOPAEDIC CONSULTATION  REQUESTING PHYSICIAN: Vernon Ranks, MD  Chief Complaint: And bearing ulcer left transtibial amputation.  HPI: [name] is a 73 y.o. male who presents with bilateral transtibial amputations with acute large and bearing ulcer on the left.  He has a healing ulcer on the right.  Past Medical History:  Diagnosis Date   Acute osteomyelitis of metatarsal bone of left foot (HCC)    Ambulates with cane    straight cane   Anemia    Cervical myelopathy (HCC) 02/06/2018   Chronic kidney disease    dailysis M W F- home   Community acquired pneumonia of left lower lobe of lung 08/18/2021   Complication of anesthesia    Coronary artery disease    Dehiscence of amputation stump of left lower extremity (HCC)    Diabetes mellitus without complication (HCC)    type2   Diabetic foot ulcer (HCC)    Diabetic neuropathy (HCC) 02/06/2018   Dysrhythmia    Gait abnormality 08/24/2018   GERD (gastroesophageal reflux disease)     01/06/20- not current   Gout    History of blood transfusion    History of blood transfusion    History of kidney stones    passed stones   Hypercholesteremia    Hypertension    Hypothyroidism    Neuromuscular disorder (HCC)    neuropathy left leg and bilateral feet   Neuropathy    Partial nontraumatic amputation of foot, 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   PONV (postoperative nausea and vomiting)    Prostate cancer (HCC)    PVD (peripheral vascular disease)    with amputations   Past Surgical History:  Procedure Laterality Date   A-FLUTTER ABLATION N/A 04/06/2020   Procedure: A-FLUTTER ABLATION;  Surgeon: Waddell Danelle ORN, MD;  Location: [facility] INVASIVE CV LAB;  Service: Cardiovascular;  Laterality: N/A;   A/V FISTULAGRAM N/A 03/05/2023   Procedure: A/V Fistulagram;  Surgeon: Pearline [name] RAMAN, MD;  Location: [facility] INVASIVE CV LAB;  Service: Vascular;  Laterality: N/A;   A/V SHUNT INTERVENTION Left 11/07/2023   Procedure: A/V SHUNT INTERVENTION;   Surgeon: Gretta Lonni PARAS, MD;  Location: [facility] PV LAB;  Service: Cardiovascular;  Laterality: Left;   AMPUTATION Left 12/25/2013   Procedure: AMPUTATION RAY LEFT 5TH RAY;  Surgeon: Jerona Harden GAILS, MD;  Location: [facility] ORS;  Service: Orthopedics;  Laterality: Left;   AMPUTATION Right 12/15/2018   Procedure: AMPUTATION OF 4TH AND 5TH TOES RIGHT FOOT;  Surgeon: Harden Jerona GAILS, MD;  Location: [facility] OR;  Service: Orthopedics;  Laterality: Right;   AMPUTATION Left 02/02/2021   Procedure: LEFT FOOT 4TH RAY AMPUTATION;  Surgeon: Harden Jerona GAILS, MD;  Location: [facility] OR;  Service: Orthopedics;  Laterality: Left;   AMPUTATION Left 05/09/2021   Procedure: LEFT BELOW KNEE AMPUTATION;  Surgeon: Harden Jerona GAILS, MD;  Location: [facility] OR;  Service: Orthopedics;  Laterality: Left;   AMPUTATION Right 07/26/2022   Procedure: RIGHT BELOW KNEE AMPUTATION;  Surgeon: Harden Jerona GAILS, MD;  Location: [facility] OR;  Service: Orthopedics;  Laterality: Right;   APPLICATION OF WOUND VAC Right 12/15/2018   Procedure: APPLICATION OF WOUND VAC;  Surgeon: Harden Jerona GAILS, MD;  Location: [facility] OR;  Service: Orthopedics;  Laterality: Right;   AV FISTULA PLACEMENT Left 03/25/2019   Procedure: LEFT ARM ARTERIOVENOUS (AV) FISTULA CREATION;  Surgeon: Serene Gaile ORN, MD;  Location: [facility] OR;  Service: Vascular;  Laterality: Left;   BACK SURGERY     BASCILIC VEIN TRANSPOSITION Left  05/20/2019   Procedure: SECOND STAGE LEFT BASCILIC VEIN TRANSPOSITION;  Surgeon: Serene Gaile ORN, MD;  Location: [facility] OR;  Service: Vascular;  Laterality: Left;   BIOPSY  06/22/2022   Procedure: BIOPSY;  Surgeon: Wilhelmenia Aloha Raddle., MD;  Location: [facility] ENDOSCOPY;  Service: Gastroenterology;;   CARDIAC CATHETERIZATION  02/17/2014   CATARACT EXTRACTION Bilateral    CHOLECYSTECTOMY     COLONOSCOPY  2011   in [place], Normal   COLONOSCOPY N/A 06/23/2022   Procedure: COLONOSCOPY;  Surgeon: Legrand Victory LITTIE DOUGLAS, MD;  Location: [facility] ENDOSCOPY;  Service: Gastroenterology;   Laterality: N/A;   CORONARY ARTERY BYPASS GRAFT  2008   CYSTOSCOPY N/A 06/29/2020   Procedure: CYSTOSCOPY WITH CLOT EVACUATION AND  FULGERATION;  Surgeon: Matilda Senior, MD;  Location: [facility] OR;  Service: Urology;  Laterality: N/A;   CYSTOSCOPY WITH FULGERATION Bilateral 06/17/2020   Procedure: CYSTOSCOPY,BILATERAL RETROGRADE, CLOT EVACUATION WITH FULGERATION OF THE BLADDER;  Surgeon: Selma Donnice SAUNDERS, MD;  Location: [facility] OR;  Service: Urology;  Laterality: Bilateral;   CYSTOSCOPY WITH FULGERATION N/A 06/28/2020   Procedure: CYSTOSCOPY WITH CLOT EVACUATION AND FULGERATION OF BLEEDERS;  Surgeon: Matilda Senior, MD;  Location: [facility] OR;  Service: Urology;  Laterality: N/A;  1 HR   ENTEROSCOPY N/A 06/22/2022   Procedure: ENTEROSCOPY;  Surgeon: Wilhelmenia Aloha Raddle., MD;  Location: [facility] ENDOSCOPY;  Service: Gastroenterology;  Laterality: N/A;   HEMOSTASIS CLIP PLACEMENT  06/22/2022   Procedure: HEMOSTASIS CLIP PLACEMENT;  Surgeon: Wilhelmenia Aloha Raddle., MD;  Location: [facility] ENDOSCOPY;  Service: Gastroenterology;;   HOT HEMOSTASIS N/A 06/22/2022   Procedure: HOT HEMOSTASIS (ARGON PLASMA COAGULATION/BICAP);  Surgeon: Wilhelmenia Aloha Raddle., MD;  Location: [facility] ENDOSCOPY;  Service: Gastroenterology;  Laterality: N/A;   I & D EXTREMITY Right 12/15/2018   Procedure: DEBRIDEMENT RIGHT FOOT;  Surgeon: Harden Jerona GAILS, MD;  Location: [facility] OR;  Service: Orthopedics;  Laterality: Right;   I & D EXTREMITY Right 08/27/2019   Procedure: PARTIAL CUBOID EXCISION RIGHT FOOT;  Surgeon: Harden Jerona GAILS, MD;  Location: [facility] OR;  Service: Orthopedics;  Laterality: Right;   I & D EXTREMITY Right 01/07/2020   Procedure: RIGHT FOOT EXCISION INFECTED BONE;  Surgeon: Harden Jerona GAILS, MD;  Location: [facility] OR;  Service: Orthopedics;  Laterality: Right;   I & D EXTREMITY Right 05/17/2022   Procedure: IRRIGATION AND DEBRIDEMENT RIGHT FOOT ABSCESS;  Surgeon: Harden Jerona GAILS, MD;  Location: [facility] OR;  Service: Orthopedics;  Laterality: Right;    IR PARACENTESIS  10/11/2022   IR PARACENTESIS  12/24/2022   IR PARACENTESIS  02/06/2023   IR PARACENTESIS  05/05/2023   IR PARACENTESIS  05/19/2023   IR PARACENTESIS  07/11/2023   IR PARACENTESIS  07/30/2023   IR PARACENTESIS  08/20/2023   IR PARACENTESIS  09/19/2023   IR PARACENTESIS  10/03/2023   IR PARACENTESIS  10/17/2023   IR PARACENTESIS  10/30/2023   IR PARACENTESIS  11/19/2023   IR PARACENTESIS  12/03/2023   IR PARACENTESIS  12/16/2023   IR PARACENTESIS  01/12/2024   IR PARACENTESIS  02/06/2024   IR PARACENTESIS  02/20/2024   IR PARACENTESIS  03/03/2024   IR PARACENTESIS  03/17/2024   NECK SURGERY     novemver 2019   PERIPHERAL VASCULAR BALLOON ANGIOPLASTY Left 03/05/2023   Procedure: PERIPHERAL VASCULAR BALLOON ANGIOPLASTY;  Surgeon: Pearline [name] RAMAN, MD;  Location: [facility] INVASIVE CV LAB;  Service: Vascular;  Laterality: Left;  AVF   POLYPECTOMY  06/22/2022   Procedure: POLYPECTOMY;  Surgeon: Wilhelmenia Aloha Raddle.,  MD;  Location: [facility] ENDOSCOPY;  Service: Gastroenterology;;   POLYPECTOMY  06/23/2022   Procedure: POLYPECTOMY;  Surgeon: Legrand Victory LITTIE DOUGLAS, MD;  Location: [facility] ENDOSCOPY;  Service: Gastroenterology;;   STUMP REVISION Left 06/27/2021   Procedure: REVISION LEFT BELOW KNEE AMPUTATION;  Surgeon: Harden Jerona GAILS, MD;  Location: [facility] OR;  Service: Orthopedics;  Laterality: Left;   SUBMUCOSAL LIFTING INJECTION  06/22/2022   Procedure: SUBMUCOSAL LIFTING INJECTION;  Surgeon: Wilhelmenia Aloha Raddle., MD;  Location: [facility] ENDOSCOPY;  Service: Gastroenterology;;   SUBMUCOSAL TATTOO INJECTION  06/22/2022   Procedure: SUBMUCOSAL TATTOO INJECTION;  Surgeon: Wilhelmenia Aloha Raddle., MD;  Location: [facility] ENDOSCOPY;  Service: Gastroenterology;;   TRANSURETHRAL RESECTION OF PROSTATE N/A 06/28/2020   Procedure: TRANSURETHRAL RESECTION OF THE PROSTATE (TURP);  Surgeon: Matilda Senior, MD;  Location: [facility] OR;  Service: Urology;  Laterality: N/A;   VENOUS ANGIOPLASTY  11/07/2023   Procedure: VENOUS  ANGIOPLASTY;  Surgeon: Gretta Lonni PARAS, MD;  Location: [facility] PV LAB;  Service: Cardiovascular;;  Basilic Vein, Subclavian Vein   WISDOM TOOTH EXTRACTION     Social History   Socioeconomic History   Marital status: Married    Spouse name: Not on file   Number of children: 2   Years of education: Not on file   Highest education level: Bachelor's degree (e.g., BA, AB, BS)  Occupational History   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  Tobacco Use   Smoking status: Former    Types: Cigars    Start date: 04/08/1973    Quit date: 12/24/1988    Years since quitting: 35.2   Smokeless tobacco: Never   Tobacco comments:    Cigars and Pipe   Vaping Use   Vaping status: Never Used  Substance and Sexual Activity   Alcohol  use: Never   Drug use: Never   Sexual activity: Yes    Partners: Female  Other Topics Concern   Not on file  Social History Narrative   Regular exercise: yes 3 times a week   Caffeine use: hot tea   Social Drivers of Corporate Investment Banker Strain: Low Risk  (09/15/2023)   Overall Financial Resource Strain (CARDIA)    Difficulty of Paying Living Expenses: Not hard at all  Food Insecurity: No Food Insecurity (01/08/2024)   Hunger Vital Sign    Worried About Running Out of Food in the Last Year: Never true    Ran Out of Food in the Last Year: Never true  Transportation Needs: No Transportation Needs (01/08/2024)   PRAPARE - Administrator, Civil Service (Medical): No    Lack of Transportation (Non-Medical): No  Physical Activity: Inactive (09/15/2023)   Exercise Vital Sign    Days of Exercise per Week: 0 days    Minutes of Exercise per Session: 0 min  Stress: No Stress Concern Present (09/15/2023)   [employer] of Occupational Health - Occupational Stress Questionnaire    Feeling of Stress : Not at all  Social Connections: Moderately Isolated (01/08/2024)   Social Connection and Isolation Panel    Frequency of Communication with Friends and  Family: More than three times a week    Frequency of Social Gatherings with Friends and Family: More than three times a week    Attends Religious Services: Never    Database Administrator or Organizations: No    Attends Banker Meetings: Never    Marital Status: Married   Family History  Problem Relation Age of Onset   Diabetes  Mellitus II Mother    Kidney disease Mother    Diabetes Mellitus II Father    CAD Father    Cancer Father        prostate   Kidney disease Father    Diabetes Mellitus II Brother    Kidney disease Brother    Diabetes Mellitus II Brother    Stomach cancer Brother 75   Kidney disease Brother    Colon cancer Neg Hx    Colon polyps Neg Hx    Esophageal cancer Neg Hx    Rectal cancer Neg Hx    Pancreatic cancer Neg Hx    - negative except otherwise stated in the family history section Allergies  Allergen Reactions   Cefazolin  Dermatitis   Morphine  Other (See Comments)    Other reaction(s): Delusions (intolerance)  Other Reaction(s): delusions   Mushroom Extract Complex (Obsolete) Nausea Only   Prior to Admission medications   Medication Sig Start Date End Date Taking? Authorizing Provider  allopurinol  (ZYLOPRIM ) 100 MG tablet Take 2 tablets (200 mg total) by mouth daily. 08/08/22  Yes Angiulli, Toribio PARAS, PA-C  levothyroxine  (SYNTHROID ) 112 MCG tablet Take 112 mcg by mouth daily before breakfast.   Yes [provider]  modafinil  (PROVIGIL ) 200 MG tablet Take 1 tablet (200 mg total) by mouth daily. 12/22/23  Yes Raulkar, Sven SQUIBB, MD  pantoprazole  (PROTONIX ) 40 MG tablet Take 1 tablet (40 mg total) by mouth daily. 12/19/23  Yes [name], NP  pregabalin  (LYRICA ) 150 MG capsule Take 150 mg by mouth in the morning, at noon, in the evening, and at bedtime.   Yes [provider]  rosuvastatin  (CRESTOR ) 10 MG tablet Take 1 tablet (10 mg total) by mouth daily. 12/19/23 03/18/24 Yes [name], NP  sevelamer   carbonate (RENVELA ) 800 MG tablet Take 800 mg by mouth 3 (three) times daily. 01/17/23  Yes [provider]   IR Paracentesis Result Date: 03/17/2024 INDICATION: Patient with ESRD and recurrent ascites. Last paracentesis 03/03/24, 4.8 L output. EXAM: ULTRASOUND GUIDED DIAGNOSTIC AND THERAPEUTIC PARACENTESIS MEDICATIONS: 13 mL 1% lidocaine  with epinephrine  COMPLICATIONS: None immediate. PROCEDURE: Informed written consent was obtained from the patient after a discussion of the risks, benefits and alternatives to treatment. A timeout was performed prior to the initiation of the procedure. Initial ultrasound scanning demonstrates a moderate to large amount of ascites within the left lower abdominal quadrant. The left lower abdomen was prepped and draped in the usual sterile fashion. 1% lidocaine  with epinephrine  was used for local anesthesia. Following this, a 19 gauge, 7-cm, Yueh catheter was introduced. An ultrasound image was saved for documentation purposes. The paracentesis was performed. The catheter was removed and a dressing was applied. The patient tolerated the procedure well without immediate post procedural complication. FINDINGS: A total of approximately 3.9 liters of clear, yellow fluid was removed. Samples were sent to the laboratory as requested by the clinical team. IMPRESSION: Successful ultrasound-guided paracentesis yielding 3.9 liters of peritoneal fluid. Performed by Laymon Coast, NP under the supervision of Dr. Juliene Balder Electronically Signed   By: Juliene Balder M.D.   On: 03/17/2024 11:08   MR TIBIA FIBULA LEFT WO CONTRAST Result Date: 03/17/2024 CLINICAL DATA:  Soft tissue infection suspected, lower leg, xray done EXAM: MRI OF LOWER LEFT EXTREMITY WITHOUT CONTRAST; MRI OF LOWER RIGHT EXTREMITY WITHOUT CONTRAST TECHNIQUE: Multiplanar, multisequence MR imaging of the bilateral lower extremities was performed. No intravenous contrast was administered. COMPARISON:  Right knee  radiograph dated 02/04/2024.  FINDINGS: Bones/Joint/Cartilage Status post bilateral below-the-knee amputations. The tibial and fibular transsection margins are intact with normal marrow signal intensity. No evidence of osteomyelitis. Mild-to-moderate osteoarthritis of the bilateral knees, more pronounced on the left. Small bilateral joint effusions. No fracture or dislocation. Ligaments Collateral and cruciate ligaments are intact. Muscles and Tendons Generalized atrophy of the bilateral anterior and posterior compartment calf musculature with increased T2 hyperintense signal, likely secondary to chronic denervation changes. Quadriceps and patellar tendons are intact. There is a Baker's cyst on the left. Soft tissue Focal region of increased T2 hyperintense fluid appearing signal with a possible layering component within the subcutaneous tissues of the right amputation stump, overlying the right tibial transsection margin, measuring 2.6 cm TR x 0.7 cm AP x 0.8 cm cc (series 9, image 41 and series 3, image 16). No significant surrounding soft tissue edema. Cutaneous irregularity and thickening at the tip of the left amputation stump. No loculated fluid collection. Subcutaneous edema of the left-greater-than-right medial calf. IMPRESSION: 1. Focal region of fluid appearing signal within the subcutaneous tissues of the right below-the-knee amputation stump, just below the skin surface and overlying the right tibial transsection margin measuring 2.6 x 0.8 x 0.7 cm. No evidence of osteomyelitis. No significant surrounding soft tissue edema. Differential considerations include small postoperative collection such as a seroma or chronic hematoma. Abscess is felt less likely, clinical correlation is recommended. 2. Cutaneous irregularity and thickening at the tip of the left below-the-knee amputation stump. No loculated fluid collection. Subcutaneous edema of the left-greater-than-right medial calf. Cellulitis is not  excluded. 3. Mild-to-moderate osteoarthritis of the bilateral knees, more pronounced on the left. Small bilateral joint effusions. 4. Generalized atrophy of the bilateral anterior and posterior compartment calf musculature. Electronically Signed   By: Harrietta Sherry M.D.   On: 03/17/2024 08:51   MR TIBIA FIBULA RIGHT WO CONTRAST Result Date: 03/17/2024 CLINICAL DATA:  Soft tissue infection suspected, lower leg, xray done EXAM: MRI OF LOWER LEFT EXTREMITY WITHOUT CONTRAST; MRI OF LOWER RIGHT EXTREMITY WITHOUT CONTRAST TECHNIQUE: Multiplanar, multisequence MR imaging of the bilateral lower extremities was performed. No intravenous contrast was administered. COMPARISON:  Right knee radiograph dated 02/04/2024. FINDINGS: Bones/Joint/Cartilage Status post bilateral below-the-knee amputations. The tibial and fibular transsection margins are intact with normal marrow signal intensity. No evidence of osteomyelitis. Mild-to-moderate osteoarthritis of the bilateral knees, more pronounced on the left. Small bilateral joint effusions. No fracture or dislocation. Ligaments Collateral and cruciate ligaments are intact. Muscles and Tendons Generalized atrophy of the bilateral anterior and posterior compartment calf musculature with increased T2 hyperintense signal, likely secondary to chronic denervation changes. Quadriceps and patellar tendons are intact. There is a Baker's cyst on the left. Soft tissue Focal region of increased T2 hyperintense fluid appearing signal with a possible layering component within the subcutaneous tissues of the right amputation stump, overlying the right tibial transsection margin, measuring 2.6 cm TR x 0.7 cm AP x 0.8 cm cc (series 9, image 41 and series 3, image 16). No significant surrounding soft tissue edema. Cutaneous irregularity and thickening at the tip of the left amputation stump. No loculated fluid collection. Subcutaneous edema of the left-greater-than-right medial calf. IMPRESSION:  1. Focal region of fluid appearing signal within the subcutaneous tissues of the right below-the-knee amputation stump, just below the skin surface and overlying the right tibial transsection margin measuring 2.6 x 0.8 x 0.7 cm. No evidence of osteomyelitis. No significant surrounding soft tissue edema. Differential considerations include small postoperative collection such as a seroma  or chronic hematoma. Abscess is felt less likely, clinical correlation is recommended. 2. Cutaneous irregularity and thickening at the tip of the left below-the-knee amputation stump. No loculated fluid collection. Subcutaneous edema of the left-greater-than-right medial calf. Cellulitis is not excluded. 3. Mild-to-moderate osteoarthritis of the bilateral knees, more pronounced on the left. Small bilateral joint effusions. 4. Generalized atrophy of the bilateral anterior and posterior compartment calf musculature. Electronically Signed   By: Harrietta Sherry M.D.   On: 03/17/2024 08:51   CT ABDOMEN PELVIS WO CONTRAST Result Date: 03/16/2024 EXAM: CT ABDOMEN AND PELVIS WITHOUT CONTRAST 03/16/2024 06:04:28 PM TECHNIQUE: CT of the abdomen and pelvis was performed without the administration of intravenous contrast. Multiplanar reformatted images are provided for review. Automated exposure control, iterative reconstruction, and/or weight-based adjustment of the mA/kV was utilized to reduce the radiation dose to as low as reasonably achievable. COMPARISON: 01/08/2024 CLINICAL HISTORY: abd pain, diarrhea FINDINGS: LOWER CHEST: Moderate right pleural effusion, new since prior study. Compressive atelectasis in the right lower lobe and right middle lobe. LIVER: The liver is shrunken and nodular, compatible with cirrhosis. No suspicious focal hepatic abnormality. GALLBLADDER AND BILE DUCTS: Prior cholecystectomy. No biliary ductal dilatation. SPLEEN: The spleen is at the upper limit of normal in size at 12.5 cm. PANCREAS: No acute  abnormality. ADRENAL GLANDS: Right adrenal mass with areas of low density centrally compatible with adenoma measuring up to 3.3 cm, stable. The left adrenal gland is unremarkable. KIDNEYS, URETERS AND BLADDER: Kidneys are atrophic. Right upper pole renal low density lesion measures up to 3.3 cm compared to 3.9 cm previously, likely benign cyst. Per consensus, no follow-up is needed for simple Bosniak type 1 and 2 renal cysts, unless the patient has a malignancy history or risk factors. No stones in the kidneys or ureters. No hydronephrosis. No perinephric or periureteral stranding. Urinary bladder is unremarkable. GI AND BOWEL: Stomach demonstrates no acute abnormality. Sigmoid diverticulosis. No acute diverticulitis. Clips again noted in the third portion of the duodenum, unchanged. There is no bowel obstruction. PERITONEUM AND RETROPERITONEUM: Large volume ascites is stable since the prior study. No free air. VASCULATURE: Diffuse coronary artery and aortic atherosclerosis. Aorta is normal in caliber. LYMPH NODES: No lymphadenopathy. REPRODUCTIVE ORGANS: Radiation seeds in the region of the prostate. BONES AND SOFT TISSUES: Small right inguinal hernia containing ascites. No acute osseous abnormality. No focal soft tissue abnormality. IMPRESSION: 1. Moderate right pleural effusion, new since prior study, with associated compressive atelectasis in the right lower lobe and right middle lobe. 2. Large volume ascites, stable since the prior study. 3. Liver with shrunken and nodular appearance, compatible with cirrhosis. No suspicious focal hepatic abnormality. Electronically signed by: Franky Crease MD 03/16/2024 06:31 PM EST RP Workstation: [id number]   DG Chest 1 View Result Date: 03/16/2024 CLINICAL DATA:  Cough diarrhea respiratory distress EXAM: DG CHEST 1V COMPARISON:  01/08/2024, 10/31/2023 FINDINGS: Hardware in the cervical spine. Sternotomy. Hypoventilatory changes with elevation of right diaphragm. At least  small right-sided pleural effusion, possibly moderate in size. Airspace disease at the right base. Stable cardiomediastinal silhouette. Patchy airspace disease at the left base as well. IMPRESSION: Hypoventilatory changes with elevation of right diaphragm. At least small right-sided pleural effusion, possibly moderate in size. Airspace disease at the right base may be due to atelectasis or pneumonia. Patchy airspace opacities at left base, possible foci of pneumonia. Electronically Signed   By: Luke Bun M.D.   On: 03/16/2024 18:04   - pertinent xrays, CT, MRI studies were reviewed  and independently interpreted  Positive ROS: All other systems have been reviewed and were otherwise negative with the exception of those mentioned in the HPI and as above.  Physical Exam: General: Alert, no acute distress Psychiatric: Patient is competent for consent with normal mood and affect Lymphatic: No axillary or cervical lymphadenopathy Cardiovascular: No pedal edema Respiratory: No cyanosis, no use of accessory musculature GI: No organomegaly, abdomen is soft and non-tender    Images:  @ENCIMAGES @  Labs:  Lab Results  Component Value Date   HGBA1C 4.9 10/29/2023   HGBA1C 5.0 05/17/2022   HGBA1C 5.5 05/04/2021   ESRSEDRATE 28 (H) 02/18/2024   ESRSEDRATE 34 (H) 01/24/2020   ESRSEDRATE 41 (H) 01/10/2020   CRP 24.8 (H) 02/18/2024   CRP 8.8 (H) 01/24/2020   CRP 35.9 (H) 01/10/2020   LABURIC 4.2 01/09/2024   REPTSTATUS 03/17/2024 FINAL 03/17/2024   GRAMSTAIN  03/17/2024    RARE WBC PRESENT, PREDOMINANTLY PMN NO ORGANISMS SEEN Performed at [name], [address] [place], [place] [address]    CULT  03/16/2024    NO GROWTH < 12 HOURS Performed at [facility], [address] [place], [place] [address]    [facility] STAPHYLOCOCCUS AUREUS 01/11/2024    Lab Results  Component Value Date   ALBUMIN  2.7 (L) 03/16/2024   ALBUMIN  2.7 (L) 03/16/2024   ALBUMIN  2.0 (L)  01/10/2024   PREALBUMIN 32.7 05/09/2021   PREALBUMIN 14.8 (L) 12/14/2018   LABURIC 4.2 01/09/2024        Latest Ref Rng & Units 03/16/2024   11:09 PM 03/16/2024    5:42 PM 03/16/2024    4:58 PM  CBC EXTENDED  WBC 4.0 - 10.5 K/uL 5.3   5.1   RBC 4.22 - 5.81 MIL/uL 4.20   4.24   Hemoglobin 13.0 - 17.0 g/dL 87.9  87.3  87.5   HCT 39.0 - 52.0 % 38.0  37.0  38.7   Platelets 150 - 400 K/uL 130   129   NEUT# 1.7 - 7.7 K/uL   3.7   Lymph# 0.7 - 4.0 K/uL   0.4     Neurologic: Patient does not have protective sensation bilateral lower extremities.   MUSCULOSKELETAL:   Skin: Examination there is a 4 cm diameter and bearing ulcer left transtibial amputation the ulcer is superficial.  There is surrounding cellulitis.  No tunneling.  Patient has a small 1 cm in diameter on the residual limb on the right but no skin color or temperature changes on the right.  White cell count 5.3 with a hemoglobin of 12.0.  Assessment: Assessment: And bearing ulceration left transtibial amputation with cellulitis.  Plan: Plan: Discussed with patient he can proceed with IV antibiotics I will place orders for Vashe dressing changes.  Discussed the importance of not wearing his prosthesis on the left until this ulcer has healed.  Patient has recently had his  sockets loosened that has contributed to this problem.  I will follow-up as an outpatient.  Patient states he would like to discharge on Friday.  Thank you for the consult and the opportunity to see Mr. Salil Raineri, MD [facility] [phone] 5:06 PM

## 2024-03-17 NOTE — Procedures (Signed)
 PROCEDURE SUMMARY:  Successful image-guided paracentesis from the left abdomen.  Yielded 3.9 liters of clear, yellow fluid.  No immediate complications.  EBL: zero Patient tolerated well.   Specimen sent for labs.  Please see imaging section of Epic for full dictation.  Treina Arscott NP 03/17/2024 10:44 AM

## 2024-03-18 ENCOUNTER — Inpatient Hospital Stay (HOSPITAL_COMMUNITY)

## 2024-03-18 ENCOUNTER — Telehealth: Payer: Self-pay | Admitting: Orthopedic Surgery

## 2024-03-18 HISTORY — PX: IR THORACENTESIS ASP PLEURAL SPACE W/IMG GUIDE: IMG5380

## 2024-03-18 LAB — BODY FLUID CELL COUNT WITH DIFFERENTIAL
Eos, Fluid: 0 %
Lymphs, Fluid: 73 %
Monocyte-Macrophage-Serous Fluid: 23 % — ABNORMAL LOW (ref 50–90)
Neutrophil Count, Fluid: 3 % (ref 0–25)
Other Cells, Fluid: 1 %
Total Nucleated Cell Count, Fluid: 349 uL (ref 0–1000)

## 2024-03-18 LAB — PATHOLOGIST SMEAR REVIEW

## 2024-03-18 LAB — HEPATITIS PANEL, ACUTE
HCV Ab: NONREACTIVE
Hep A IgM: NONREACTIVE
Hep B C IgM: NONREACTIVE
Hepatitis B Surface Ag: NONREACTIVE

## 2024-03-18 LAB — LACTATE DEHYDROGENASE, PLEURAL OR PERITONEAL FLUID: LD, Fluid: 57 U/L — ABNORMAL HIGH (ref 3–23)

## 2024-03-18 LAB — PROTEIN, PLEURAL OR PERITONEAL FLUID: Total protein, fluid: <3 g/dL

## 2024-03-18 MED ORDER — VANCOMYCIN HCL IN DEXTROSE 1-5 GM/200ML-% IV SOLN: 1000.0000 mg | INTRAVENOUS | Status: AC

## 2024-03-18 MED ORDER — VANCOMYCIN HCL IN DEXTROSE 1-5 GM/200ML-% IV SOLN: 1000.0000 mg | INTRAVENOUS | Status: DC

## 2024-03-18 MED ORDER — LIDOCAINE-EPINEPHRINE 1 %-1:100000 IJ SOLN
20.0000 mL | Freq: Once | INTRAMUSCULAR | Status: AC
  Administered 2024-03-18: 10 mL via INTRADERMAL

## 2024-03-18 MED ORDER — LIDOCAINE-EPINEPHRINE 1 %-1:100000 IJ SOLN
INTRAMUSCULAR | Status: AC
  Filled 2024-03-18: qty 1

## 2024-03-18 NOTE — Progress Notes (Signed)
°  [facility] Progress Note   Subjective:   Completed dialysis yesterday with 3.3L UF Also had paracentesis with 3.8L removed.  Breathing better.  Does not want to do dialysis today   Objective Vitals:   03/17/24 2321 03/17/24 2337 03/17/24 2343 03/18/24 0358  BP: (!) 114/59 (!) 115/57 (!) 142/59 (!) 141/52  Pulse: 78 81 79 67  Resp: 15 17 14 20   Temp:  97.8 F (36.6 C)  97.8 F (36.6 C)  TempSrc:  Oral  Oral  SpO2: 93% 96% 96%   Weight:      Height:         Additional Objective Labs: Basic Metabolic Panel: Recent Labs  Lab 03/16/24 1658 03/16/24 1742 03/16/24 2309 03/17/24 2014  NA 140 139 139 143  K 4.2 4.2 4.4 4.7  CL 100 101 99 102  CO2 26  --  23 22  GLUCOSE 94 91 88 101*  BUN 31* 32* 32* 39*  CREATININE 6.04* 5.90* 6.32* 7.35*  CALCIUM  8.3*  --  8.2* 8.3*   CBC: Recent Labs  Lab 03/16/24 1658 03/16/24 1742 03/16/24 2309 03/17/24 2014  WBC 5.1  --  5.3 4.9  NEUTROABS 3.7  --   --  3.5  HGB 12.4* 12.6* 12.0* 10.7*  HCT 38.7* 37.0* 38.0* 32.9*  MCV 91.3  --  90.5 89.2  PLT 129*  --  130* 127*   Blood Culture    Component Value Date/Time   SDES PERITONEAL 03/17/2024 0852   SPECREQUEST ABDOMEN 03/17/2024 0852   CULT  03/16/2024 2113    NO GROWTH 1 DAY Performed at [facility], [address], [place], [place] [address]    REPTSTATUS 03/17/2024 FINAL 03/17/2024 9147   Physical Exam General: Alert, nad Heart: RRR Lungs: Diminished at R base Abdomen: soft  non-tender Extremities: No LE edema  Dialysis Access: AVF   Medications:  albumin  human     cefTRIAXone  (ROCEPHIN )  IV 2 g (03/17/24 1451)    allopurinol   200 mg Oral Daily   Chlorhexidine  Gluconate Cloth  6 each Topical Q0600   levothyroxine   125 mcg Oral Q0600   modafinil   200 mg Oral Daily   pantoprazole   40 mg Oral Daily   pregabalin   150 mg Oral TID   rosuvastatin   10 mg Oral Daily   sevelamer  carbonate  800 mg Oral TID   vancomycin  variable dose per  unstable renal function (pharmacist dosing)   Does not apply See admin instructions    Dialysis Orders:  [facility] TTS 3:45 EDW 83.4 kg 2K/2.5Ca AVF No heparin    Assessment/Plan: ESRD. HD TTS. Completed HD overnight. Next HD Saturday.  Cirrhosis w recurrent ascites. S/p paracentesis  Fatigue/weakness. Per primary  Diarrhea: per pmd Volume overload/ ascites: pt has chronic volume overload. S/p LVP with  3.8 L removed. More vol off w/ HD tonight/  tomorrow.   HTN : bp's high, not on bp lowering meds at home it appears. Plan HD w/ max UF as tolerated.  Anemia of esrd: Hb 12, follow.  Jon Ronnald Acosta PA-C [facility] 03/18/2024,9:29 AM

## 2024-03-18 NOTE — Progress Notes (Signed)
 D/c orders noted. Contacted out-pt HD clinic, [facility], to inform of pt d/c and anticipated arrival back to clinic on sat. No further support needed.   [name] Dialysis Navigator [phone]

## 2024-03-18 NOTE — Discharge Summary (Signed)
 Physician Discharge Summary  [name] FMW:[id number] DOB: [birth year] DOA: 03/16/2024  PCP: [name], NP  Admit date: 03/16/2024 Discharge date: 03/18/2024  Admitted From: (Home) Disposition:  (Home )  Recommendations for Outpatient Follow-up:  Follow up with PCP in 1-2 weeks Please obtain BMP/CBC in one week  Diet recommendation: Renal with 1200cc fluid restriction  Brief/Interim Summary:   [name] is a 73 y.o. male with history of CAD status post CABG, diabetes mellitus type 2, ESRD on hemodialysis Tuesday Thursday Saturday, history of atrial flutter status post ablation on December 2021 now in permanent A-fib unable to tolerate anticoagulation due to severe GI bleed, bilateral BKA, cirrhosis of the liver secondary to [facility] patient is to the ER because of increasing weakness fatigue and over the last 2 to 3 days has multiple episodes of watery diarrhea.  Denies chest pain productive cough fever or chills.  Has been having poor appetite.   Patient was admitted in October 2025 for right leg septic bursitis of the right knee was on antibiotics until recently last month.    In the ER patient is afebrile.  CT abdomen and pelvis shows large ascites and also moderate right pleural effusion.  Labs show WBC of 5.1 hemoglobin 12.4 albumin  of 2.7.  COVID and flu test negative.  EKG shows A-fib rate controlled.  On exam patient's left stump of the below-knee amputation appears erythematous and also has a small skin bleb which is has some discharge.  Right stump looks mildly erythematous.  Patient admitted for fluid overload likely from third spacing increasing fatigue and weakness could be from deconditioning.  Please see discussion below  Fluid overload  - In a dialysis patient, known ascites in the setting of liver cirrhosis, status post paracentesis 3.8 L removed, as well right pleural effusion status post paracentesis 1 to 1.8 L removed, as well small volume off during hemodialysis  yesterday, 3.3 L ultrafiltration, he declined dialysis today, so he will be discharged to continue his outpatient scheduled for HD starting tomorrow.     Generalized weakness fatigue - Continue with outpatient PT - B12 and ammonia normal.   Cirrhosis with recurrent ascites - Post paracentesis  Right pleural effusion - S/p thoracentesis, 0.5 L drained this is in the setting of volume overload and third spacing   Diarrhea  - resolved  bearing ulceration left transtibial amputation with cellulitis  - Orthopedic hand input greatly appreciated, continue with antibiotic treatment, will arrange for another week as an outpatient of IV vancomycin  postdialysis-sinew with wound care per orthopedic recommendations - Orthopedic discussed importance of not wearing his prosthesis on the left until his ulcers has healed, patient to follow as an outpatient with orthopedic  CAD status post CABG  -denied any chest pain.  Given the weakness, admitting hospitalist decided to check troponins which are elevated but flat and stable compared to previously checked many years ago as well.  ESRD on hemodialysis on Tuesday Thursday Saturday.  Patient was dialyzed yesterday, and he was supposed to be dialyzed today for volume optimization, so he has declined for dialysis today, so he is to continue his dialysis on outpatient schedule    Hypertension  -not on any antihypertensives.    Anemia of chronic kidney disease:  - Hemoglobin at baseline   History of permanent A-fib presently rate controlled.  Not on anticoagulation secondary to history of GI bleed.  Hypothyroidism on Synthroid  125 mcg.  TSH slightly elevated at 6.4.  Will hold on increasing  his TSH, will defer to outpatient  Neuropathy on Lyrica .  Confirmed on PDMP website.  Gout on allopurinol .  Cervical myelopathy. On Lyrica   Discharge Diagnoses:  Principal Problem:   Fluid overload Active Problems:   CAD (coronary artery disease)   ESRD  on dialysis (HCC)   Insulin  dependent type 2 diabetes mellitus (HCC)   Anemia of chronic disease   Essential hypertension   S/P CABG (coronary artery bypass graft)   Hypothyroidism   Neuropathy (HCC)   Gout   Prostate cancer (HCC)   Cervical myelopathy (HCC)   Dehiscence of amputation stump of left lower extremity (HCC)   Anemia of chronic renal failure   Anemia due to chronic blood loss   Diarrhea   Amputation stump infection [facility])    Discharge Instructions  Discharge Instructions     Discharge wound care:   Complete by: As directed    Cleanse wound to left BKA stump with NS and pat dry. Apply alginate (LAWSON # [id number]) to open areas.  Cover with gauze and kerlix/tape.  Change daily.   Increase activity slowly   Complete by: As directed       Allergies as of 03/18/2024       Reactions   Cefazolin  Dermatitis   Morphine  Other (See Comments)   Other reaction(s): Delusions (intolerance) Other Reaction(s): delusions   Mushroom Extract Complex (obsolete) Nausea Only        Medication List     TAKE these medications    allopurinol  100 MG tablet Commonly known as: ZYLOPRIM  Take 2 tablets (200 mg total) by mouth daily.   modafinil  200 MG tablet Commonly known as: PROVIGIL  Take 1 tablet (200 mg total) by mouth daily.   pantoprazole  40 MG tablet Commonly known as: PROTONIX  Take 1 tablet (40 mg total) by mouth daily.   pregabalin  150 MG capsule Commonly known as: LYRICA  Take 150 mg by mouth in the morning, at noon, in the evening, and at bedtime.   rosuvastatin  10 MG tablet Commonly known as: CRESTOR  Take 1 tablet (10 mg total) by mouth daily.   sevelamer  carbonate 800 MG tablet Commonly known as: RENVELA  Take 800 mg by mouth 3 (three) times daily.   Synthroid  112 MCG tablet Generic drug: levothyroxine  Take 112 mcg by mouth daily before breakfast.   vancomycin  1-5 GM/200ML-% Soln Commonly known as: VANCOCIN  Inject 200 mLs (1,000 mg total) into the  vein Every Tuesday,Thursday,and Saturday with dialysis for 7 days. Give after hemodialysis for 1 week Start taking on: March 20, 2024               Discharge Care Instructions  (From admission, onward)           Start     Ordered   03/18/24 0000  Discharge wound care:       Comments: Cleanse wound to left BKA stump with NS and pat dry. Apply alginate (LAWSON # [id number]) to open areas.  Cover with gauze and kerlix/tape.  Change daily.   03/18/24 1410            Follow-up Information     Harden Jerona GAILS, MD Follow up in 1 week(s).   Specialty: Orthopedic Surgery Contact information: [address] [place]  [place] [address] [phone]                Allergies[1]  Consultations: Ortho  renal   Procedures/Studies: DG Chest 1 View Result Date: 03/18/2024 EXAM: XR Chest, 1 View. CLINICAL HISTORY: 73 year old male  status post ultrasound guided right sided thoracentesis this morning. COMPARISON: Portable chest radiograph 03/16/2024. FINDINGS: LUNGS: Mildly improved lung volumes. Stable left lung ventilation. No consolidation. PLEURAL SPACES: Decreased veiling right pleural effusion, small volume residual now. No pneumothorax identified. HEART: The heart size is normal. BONES: Chronic sternotomy wires and cervical ACDF. No acute osseous abnormality. IMPRESSION: 1. Decreased right pleural effusion and No pneumothorax following thoracentesis. 2. No new cardiopulmonary abnormality. Electronically signed by: Helayne Hurst MD 03/18/2024 11:52 AM EST RP Workstation: [id number]   IR Paracentesis Result Date: 03/17/2024 INDICATION: Patient with ESRD and recurrent ascites. Last paracentesis 03/03/24, 4.8 L output. EXAM: ULTRASOUND GUIDED DIAGNOSTIC AND THERAPEUTIC PARACENTESIS MEDICATIONS: 13 mL 1% lidocaine  with epinephrine  COMPLICATIONS: None immediate. PROCEDURE: Informed written consent was obtained from the patient after a discussion of the risks, benefits and alternatives  to treatment. A timeout was performed prior to the initiation of the procedure. Initial ultrasound scanning demonstrates a moderate to large amount of ascites within the left lower abdominal quadrant. The left lower abdomen was prepped and draped in the usual sterile fashion. 1% lidocaine  with epinephrine  was used for local anesthesia. Following this, a 19 gauge, 7-cm, Yueh catheter was introduced. An ultrasound image was saved for documentation purposes. The paracentesis was performed. The catheter was removed and a dressing was applied. The patient tolerated the procedure well without immediate post procedural complication. FINDINGS: A total of approximately 3.9 liters of clear, yellow fluid was removed. Samples were sent to the laboratory as requested by the clinical team. IMPRESSION: Successful ultrasound-guided paracentesis yielding 3.9 liters of peritoneal fluid. Performed by Laymon Coast, NP under the supervision of Dr. Juliene Balder Electronically Signed   By: Juliene Balder M.D.   On: 03/17/2024 11:08   MR TIBIA FIBULA LEFT WO CONTRAST Result Date: 03/17/2024 CLINICAL DATA:  Soft tissue infection suspected, lower leg, xray done EXAM: MRI OF LOWER LEFT EXTREMITY WITHOUT CONTRAST; MRI OF LOWER RIGHT EXTREMITY WITHOUT CONTRAST TECHNIQUE: Multiplanar, multisequence MR imaging of the bilateral lower extremities was performed. No intravenous contrast was administered. COMPARISON:  Right knee radiograph dated 02/04/2024. FINDINGS: Bones/Joint/Cartilage Status post bilateral below-the-knee amputations. The tibial and fibular transsection margins are intact with normal marrow signal intensity. No evidence of osteomyelitis. Mild-to-moderate osteoarthritis of the bilateral knees, more pronounced on the left. Small bilateral joint effusions. No fracture or dislocation. Ligaments Collateral and cruciate ligaments are intact. Muscles and Tendons Generalized atrophy of the bilateral anterior and posterior compartment  calf musculature with increased T2 hyperintense signal, likely secondary to chronic denervation changes. Quadriceps and patellar tendons are intact. There is a Baker's cyst on the left. Soft tissue Focal region of increased T2 hyperintense fluid appearing signal with a possible layering component within the subcutaneous tissues of the right amputation stump, overlying the right tibial transsection margin, measuring 2.6 cm TR x 0.7 cm AP x 0.8 cm cc (series 9, image 41 and series 3, image 16). No significant surrounding soft tissue edema. Cutaneous irregularity and thickening at the tip of the left amputation stump. No loculated fluid collection. Subcutaneous edema of the left-greater-than-right medial calf. IMPRESSION: 1. Focal region of fluid appearing signal within the subcutaneous tissues of the right below-the-knee amputation stump, just below the skin surface and overlying the right tibial transsection margin measuring 2.6 x 0.8 x 0.7 cm. No evidence of osteomyelitis. No significant surrounding soft tissue edema. Differential considerations include small postoperative collection such as a seroma or chronic hematoma. Abscess is felt less likely, clinical correlation is recommended. 2. Cutaneous  irregularity and thickening at the tip of the left below-the-knee amputation stump. No loculated fluid collection. Subcutaneous edema of the left-greater-than-right medial calf. Cellulitis is not excluded. 3. Mild-to-moderate osteoarthritis of the bilateral knees, more pronounced on the left. Small bilateral joint effusions. 4. Generalized atrophy of the bilateral anterior and posterior compartment calf musculature. Electronically Signed   By: Harrietta Sherry M.D.   On: 03/17/2024 08:51   MR TIBIA FIBULA RIGHT WO CONTRAST Result Date: 03/17/2024 CLINICAL DATA:  Soft tissue infection suspected, lower leg, xray done EXAM: MRI OF LOWER LEFT EXTREMITY WITHOUT CONTRAST; MRI OF LOWER RIGHT EXTREMITY WITHOUT CONTRAST  TECHNIQUE: Multiplanar, multisequence MR imaging of the bilateral lower extremities was performed. No intravenous contrast was administered. COMPARISON:  Right knee radiograph dated 02/04/2024. FINDINGS: Bones/Joint/Cartilage Status post bilateral below-the-knee amputations. The tibial and fibular transsection margins are intact with normal marrow signal intensity. No evidence of osteomyelitis. Mild-to-moderate osteoarthritis of the bilateral knees, more pronounced on the left. Small bilateral joint effusions. No fracture or dislocation. Ligaments Collateral and cruciate ligaments are intact. Muscles and Tendons Generalized atrophy of the bilateral anterior and posterior compartment calf musculature with increased T2 hyperintense signal, likely secondary to chronic denervation changes. Quadriceps and patellar tendons are intact. There is a Baker's cyst on the left. Soft tissue Focal region of increased T2 hyperintense fluid appearing signal with a possible layering component within the subcutaneous tissues of the right amputation stump, overlying the right tibial transsection margin, measuring 2.6 cm TR x 0.7 cm AP x 0.8 cm cc (series 9, image 41 and series 3, image 16). No significant surrounding soft tissue edema. Cutaneous irregularity and thickening at the tip of the left amputation stump. No loculated fluid collection. Subcutaneous edema of the left-greater-than-right medial calf. IMPRESSION: 1. Focal region of fluid appearing signal within the subcutaneous tissues of the right below-the-knee amputation stump, just below the skin surface and overlying the right tibial transsection margin measuring 2.6 x 0.8 x 0.7 cm. No evidence of osteomyelitis. No significant surrounding soft tissue edema. Differential considerations include small postoperative collection such as a seroma or chronic hematoma. Abscess is felt less likely, clinical correlation is recommended. 2. Cutaneous irregularity and thickening at the tip  of the left below-the-knee amputation stump. No loculated fluid collection. Subcutaneous edema of the left-greater-than-right medial calf. Cellulitis is not excluded. 3. Mild-to-moderate osteoarthritis of the bilateral knees, more pronounced on the left. Small bilateral joint effusions. 4. Generalized atrophy of the bilateral anterior and posterior compartment calf musculature. Electronically Signed   By: Harrietta Sherry M.D.   On: 03/17/2024 08:51   CT ABDOMEN PELVIS WO CONTRAST Result Date: 03/16/2024 EXAM: CT ABDOMEN AND PELVIS WITHOUT CONTRAST 03/16/2024 06:04:28 PM TECHNIQUE: CT of the abdomen and pelvis was performed without the administration of intravenous contrast. Multiplanar reformatted images are provided for review. Automated exposure control, iterative reconstruction, and/or weight-based adjustment of the mA/kV was utilized to reduce the radiation dose to as low as reasonably achievable. COMPARISON: 01/08/2024 CLINICAL HISTORY: abd pain, diarrhea FINDINGS: LOWER CHEST: Moderate right pleural effusion, new since prior study. Compressive atelectasis in the right lower lobe and right middle lobe. LIVER: The liver is shrunken and nodular, compatible with cirrhosis. No suspicious focal hepatic abnormality. GALLBLADDER AND BILE DUCTS: Prior cholecystectomy. No biliary ductal dilatation. SPLEEN: The spleen is at the upper limit of normal in size at 12.5 cm. PANCREAS: No acute abnormality. ADRENAL GLANDS: Right adrenal mass with areas of low density centrally compatible with adenoma measuring up to 3.3 cm,  stable. The left adrenal gland is unremarkable. KIDNEYS, URETERS AND BLADDER: Kidneys are atrophic. Right upper pole renal low density lesion measures up to 3.3 cm compared to 3.9 cm previously, likely benign cyst. Per consensus, no follow-up is needed for simple Bosniak type 1 and 2 renal cysts, unless the patient has a malignancy history or risk factors. No stones in the kidneys or ureters. No  hydronephrosis. No perinephric or periureteral stranding. Urinary bladder is unremarkable. GI AND BOWEL: Stomach demonstrates no acute abnormality. Sigmoid diverticulosis. No acute diverticulitis. Clips again noted in the third portion of the duodenum, unchanged. There is no bowel obstruction. PERITONEUM AND RETROPERITONEUM: Large volume ascites is stable since the prior study. No free air. VASCULATURE: Diffuse coronary artery and aortic atherosclerosis. Aorta is normal in caliber. LYMPH NODES: No lymphadenopathy. REPRODUCTIVE ORGANS: Radiation seeds in the region of the prostate. BONES AND SOFT TISSUES: Small right inguinal hernia containing ascites. No acute osseous abnormality. No focal soft tissue abnormality. IMPRESSION: 1. Moderate right pleural effusion, new since prior study, with associated compressive atelectasis in the right lower lobe and right middle lobe. 2. Large volume ascites, stable since the prior study. 3. Liver with shrunken and nodular appearance, compatible with cirrhosis. No suspicious focal hepatic abnormality. Electronically signed by: Franky Crease MD 03/16/2024 06:31 PM EST RP Workstation: [id number]   DG Chest 1 View Result Date: 03/16/2024 CLINICAL DATA:  Cough diarrhea respiratory distress EXAM: DG CHEST 1V COMPARISON:  01/08/2024, 10/31/2023 FINDINGS: Hardware in the cervical spine. Sternotomy. Hypoventilatory changes with elevation of right diaphragm. At least small right-sided pleural effusion, possibly moderate in size. Airspace disease at the right base. Stable cardiomediastinal silhouette. Patchy airspace disease at the left base as well. IMPRESSION: Hypoventilatory changes with elevation of right diaphragm. At least small right-sided pleural effusion, possibly moderate in size. Airspace disease at the right base may be due to atelectasis or pneumonia. Patchy airspace opacities at left base, possible foci of pneumonia. Electronically Signed   By: Luke Bun M.D.   On:  03/16/2024 18:04   IR Paracentesis Result Date: 03/10/2024 INDICATION: End-stage renal disease, recurrent ascites EXAM: ULTRASOUND GUIDED PARACENTESIS MEDICATIONS: 1% lidocaine  local COMPLICATIONS: None immediate. PROCEDURE: An ultrasound guided paracentesis was thoroughly discussed with the patient and questions answered. The benefits, risks, alternatives and complications were also discussed. The patient understands and wishes to proceed with the procedure. Written consent was obtained. Ultrasound was performed to localize and mark an adequate pocket of fluid in the left lower quadrant of the abdomen. The area was then prepped and draped in the normal sterile fashion. 1% Lidocaine  was used for local anesthesia. Under ultrasound guidance a 19 gauge Yueh catheter was introduced. Paracentesis was performed. The catheter was removed and a dressing applied. FINDINGS: A total of approximately 4.8 L of serosanguineous peritoneal fluid was removed. A fluid sample was not sent for laboratory analysis. IMPRESSION: Successful ultrasound guided paracentesis yielding 4.8 L of ascites. Performed by: Wyatt Pommier, PA-C Electronically Signed   By: CHRISTELLA.  Shick M.D.   On: 03/10/2024 14:21   IR Paracentesis Result Date: 02/20/2024 INDICATION: Patient with a history of end-stage renal disease with recurrent ascites. Interventional Radiology asked to perform a therapeutic paracentesis. EXAM: ULTRASOUND GUIDED PARACENTESIS MEDICATIONS: 1% lidocaine  10 mL COMPLICATIONS: None immediate. PROCEDURE: Informed written consent was obtained from the patient after a discussion of the risks, benefits and alternatives to treatment. A timeout was performed prior to the initiation of the procedure. Initial ultrasound scanning demonstrates a large amount of ascites  within the left lower abdominal quadrant. The left lower abdomen was prepped and draped in the usual sterile fashion. 1% lidocaine  was used for local anesthesia. Following this, a  19 gauge, 7-cm, Yueh catheter was introduced. An ultrasound image was saved for documentation purposes. The paracentesis was performed. The catheter was removed and a dressing was applied. The patient tolerated the procedure well without immediate post procedural complication. FINDINGS: A total of approximately 7.1 L of clear yellow fluid was removed. IMPRESSION: Successful ultrasound-guided paracentesis yielding 7.1 liters of peritoneal fluid. Procedure performed by Warren Dais, NP Electronically Signed   By: CHRISTELLA.  Shick M.D.   On: 02/20/2024 15:33      Subjective: Eager to go home today, no further diarrhea, denies any dyspnea,  Discharge Exam: Vitals:   03/17/24 2343 03/18/24 0358  BP: (!) 142/59 (!) 141/52  Pulse: 79 67  Resp: 14 20  Temp:  97.8 F (36.6 C)  SpO2: 96%    Vitals:   03/17/24 2321 03/17/24 2337 03/17/24 2343 03/18/24 0358  BP: (!) 114/59 (!) 115/57 (!) 142/59 (!) 141/52  Pulse: 78 81 79 67  Resp: 15 17 14 20   Temp:  97.8 F (36.6 C)  97.8 F (36.6 C)  TempSrc:  Oral  Oral  SpO2: 93% 96% 96%   Weight:      Height:        General: Pt is alert, awake, not in acute distress Cardiovascular: RRR, S1/S2 +, no rubs, no gallops Respiratory: Diminished air entry in the right lung Abdominal: Soft, NT, ND, bowel sounds + Extremities: Bilateral BKA, left BKA bandaged    The results of significant diagnostics from this hospitalization (including imaging, microbiology, ancillary and laboratory) are listed below for reference.     Microbiology: Recent Results (from the past 240 hours)  Resp panel by RT-PCR (RSV, Flu A&B, Covid) Anterior Nasal Swab     Status: None   Collection Time: 03/16/24  4:58 PM   Specimen: Anterior Nasal Swab  Result Value Ref Range Status   SARS Coronavirus 2 by RT PCR NEGATIVE NEGATIVE Final   Influenza A by PCR NEGATIVE NEGATIVE Final   Influenza B by PCR NEGATIVE NEGATIVE Final    Comment: (NOTE) The Xpert Xpress SARS-CoV-2/FLU/RSV  plus assay is intended as an aid in the diagnosis of influenza from Nasopharyngeal swab specimens and should not be used as a sole basis for treatment. Nasal washings and aspirates are unacceptable for Xpert Xpress SARS-CoV-2/FLU/RSV testing.  Fact Sheet for Patients: bloggercourse.com  Fact Sheet for Healthcare Providers: seriousbroker.it  This test is not yet approved or cleared by the [place]  FDA and has been authorized for detection and/or diagnosis of SARS-CoV-2 by FDA under an Emergency Use Authorization (EUA). This EUA will remain in effect (meaning this test can be used) for the duration of the COVID-19 declaration under Section 564(b)(1) of the Act, 21 U.S.C. section 360bbb-3(b)(1), unless the authorization is terminated or revoked.     Resp Syncytial Virus by PCR NEGATIVE NEGATIVE Final    Comment: (NOTE) Fact Sheet for Patients: bloggercourse.com  Fact Sheet for Healthcare Providers: seriousbroker.it  This test is not yet approved or cleared by the [place]  FDA and has been authorized for detection and/or diagnosis of SARS-CoV-2 by FDA under an Emergency Use Authorization (EUA). This EUA will remain in effect (meaning this test can be used) for the duration of the COVID-19 declaration under Section 564(b)(1) of the Act, 21 U.S.C. section 360bbb-3(b)(1), unless the authorization is  terminated or revoked.  Performed at [facility], [address], [place], [place] [address]   Culture, blood (Routine X 2) w Reflex to ID Panel     Status: None (Preliminary result)   Collection Time: 03/16/24  9:08 PM   Specimen: BLOOD RIGHT ARM  Result Value Ref Range Status   Specimen Description BLOOD RIGHT ARM  Final   Special Requests   Final    BOTTLES DRAWN AEROBIC AND ANAEROBIC Blood Culture adequate volume   Culture   Final    NO GROWTH 1 DAY Performed at  [facility], [address], [place], [place] [address]    Report Status PENDING  Incomplete  Culture, blood (Routine X 2) w Reflex to ID Panel     Status: None (Preliminary result)   Collection Time: 03/16/24  9:13 PM   Specimen: BLOOD RIGHT ARM  Result Value Ref Range Status   Specimen Description BLOOD RIGHT ARM  Final   Special Requests   Final    BOTTLES DRAWN AEROBIC AND ANAEROBIC Blood Culture adequate volume   Culture   Final    NO GROWTH 1 DAY Performed at [facility], [address], [place], [place] [address]    Report Status PENDING  Incomplete  Gram stain     Status: None   Collection Time: 03/17/24  8:52 AM   Specimen: Abdomen; Peritoneal Fluid  Result Value Ref Range Status   Specimen Description PERITONEAL  Final   Special Requests ABDOMEN  Final   Gram Stain   Final    RARE WBC PRESENT, PREDOMINANTLY PMN NO ORGANISMS SEEN Performed at [facility], [address], [place], [place] [address]    Report Status 03/17/2024 FINAL  Final     Labs: BNP (last 3 results) Recent Labs    05/05/23 1642 10/29/23 0930  BNP 1,579.9* 3,272.3*   Basic Metabolic Panel: Recent Labs  Lab 03/16/24 1658 03/16/24 1742 03/16/24 2309 03/17/24 2014  NA 140 139 139 143  K 4.2 4.2 4.4 4.7  CL 100 101 99 102  CO2 26  --  23 22  GLUCOSE 94 91 88 101*  BUN 31* 32* 32* 39*  CREATININE 6.04* 5.90* 6.32* 7.35*  CALCIUM  8.3*  --  8.2* 8.3*   Liver Function Tests: Recent Labs  Lab 03/16/24 1658 03/16/24 2309 03/17/24 2014  AST 19 23 18   ALT 11 12 9   ALKPHOS 132* 129* 111  BILITOT 1.3* 1.3* 1.1  PROT 5.5* 5.5* 4.8*  ALBUMIN  2.7* 2.7* 2.2*   Recent Labs  Lab 03/16/24 1658  LIPASE 50   Recent Labs  Lab 03/17/24 0656  AMMONIA 15   CBC: Recent Labs  Lab 03/16/24 1658 03/16/24 1742 03/16/24 2309 03/17/24 2014  WBC 5.1  --  5.3 4.9  NEUTROABS 3.7  --   --  3.5  HGB 12.4* 12.6* 12.0* 10.7*  HCT 38.7* 37.0* 38.0* 32.9*  MCV 91.3  --  90.5  89.2  PLT 129*  --  130* 127*   Cardiac Enzymes: Recent Labs  Lab 03/17/24 0656  CKTOTAL 50   BNP: Invalid input(s): POCBNP CBG: No results for input(s): GLUCAP in the last 168 hours. D-Dimer No results for input(s): DDIMER in the last 72 hours. Hgb A1c No results for input(s): HGBA1C in the last 72 hours. Lipid Profile No results for input(s): CHOL, HDL, LDLCALC, TRIG, CHOLHDL, LDLDIRECT in the last 72 hours. Thyroid  function studies Recent Labs  03/16/24 2309  TSH 6.414*   Anemia work up Recent Labs    03/17/24 0656  VITAMINB12 562   Urinalysis    Component Value Date/Time   COLORURINE YELLOW 07/22/2022 2356   APPEARANCEUR CLEAR 07/22/2022 2356   LABSPEC 1.022 07/22/2022 2356   PHURINE 6.5 07/22/2022 2356   GLUCOSEU NEGATIVE 07/22/2022 2356   HGBUR SMALL (A) 07/22/2022 2356   BILIRUBINUR NEGATIVE 07/22/2022 2356   KETONESUR NEGATIVE 07/22/2022 2356   PROTEINUR >300 (A) 07/22/2022 2356   UROBILINOGEN 0.2 12/28/2014 1031   NITRITE NEGATIVE 07/22/2022 2356   LEUKOCYTESUR TRACE (A) 07/22/2022 2356   Sepsis Labs Recent Labs  Lab 03/16/24 1658 03/16/24 2309 03/17/24 2014  WBC 5.1 5.3 4.9   Microbiology Recent Results (from the past 240 hours)  Resp panel by RT-PCR (RSV, Flu A&B, Covid) Anterior Nasal Swab     Status: None   Collection Time: 03/16/24  4:58 PM   Specimen: Anterior Nasal Swab  Result Value Ref Range Status   SARS Coronavirus 2 by RT PCR NEGATIVE NEGATIVE Final   Influenza A by PCR NEGATIVE NEGATIVE Final   Influenza B by PCR NEGATIVE NEGATIVE Final    Comment: (NOTE) The Xpert Xpress SARS-CoV-2/FLU/RSV plus assay is intended as an aid in the diagnosis of influenza from Nasopharyngeal swab specimens and should not be used as a sole basis for treatment. Nasal washings and aspirates are unacceptable for Xpert Xpress SARS-CoV-2/FLU/RSV testing.  Fact Sheet for  Patients: bloggercourse.com  Fact Sheet for Healthcare Providers: seriousbroker.it  This test is not yet approved or cleared by the [place]  FDA and has been authorized for detection and/or diagnosis of SARS-CoV-2 by FDA under an Emergency Use Authorization (EUA). This EUA will remain in effect (meaning this test can be used) for the duration of the COVID-19 declaration under Section 564(b)(1) of the Act, 21 U.S.C. section 360bbb-3(b)(1), unless the authorization is terminated or revoked.     Resp Syncytial Virus by PCR NEGATIVE NEGATIVE Final    Comment: (NOTE) Fact Sheet for Patients: bloggercourse.com  Fact Sheet for Healthcare Providers: seriousbroker.it  This test is not yet approved or cleared by the [place]  FDA and has been authorized for detection and/or diagnosis of SARS-CoV-2 by FDA under an Emergency Use Authorization (EUA). This EUA will remain in effect (meaning this test can be used) for the duration of the COVID-19 declaration under Section 564(b)(1) of the Act, 21 U.S.C. section 360bbb-3(b)(1), unless the authorization is terminated or revoked.  Performed at [facility], [address] [place], [place] [address]   Culture, blood (Routine X 2) w Reflex to ID Panel     Status: None (Preliminary result)   Collection Time: 03/16/24  9:08 PM   Specimen: BLOOD RIGHT ARM  Result Value Ref Range Status   Specimen Description BLOOD RIGHT ARM  Final   Special Requests   Final    BOTTLES DRAWN AEROBIC AND ANAEROBIC Blood Culture adequate volume   Culture   Final    NO GROWTH 1 DAY Performed at [facility], [name] [place], [place] [address]    Report Status PENDING  Incomplete  Culture, blood (Routine X 2) w Reflex to ID Panel     Status: None (Preliminary result)   Collection Time: 03/16/24  9:13 PM   Specimen: BLOOD RIGHT ARM   Result Value Ref Range Status   Specimen Description BLOOD RIGHT ARM  Final   Special Requests   Final  BOTTLES DRAWN AEROBIC AND ANAEROBIC Blood Culture adequate volume   Culture   Final    NO GROWTH 1 DAY Performed at [facility], [address] [place], [place] [address]    Report Status PENDING  Incomplete  Gram stain     Status: None   Collection Time: 03/17/24  8:52 AM   Specimen: Abdomen; Peritoneal Fluid  Result Value Ref Range Status   Specimen Description PERITONEAL  Final   Special Requests ABDOMEN  Final   Gram Stain   Final    RARE WBC PRESENT, PREDOMINANTLY PMN NO ORGANISMS SEEN Performed at [facility], [address], [place], [place] [address]    Report Status 03/17/2024 FINAL  Final     Time coordinating discharge: Over 30 minutes  SIGNED:   Brayton Lye, MD  [facility] 03/18/2024, 2:25 PM Pager   If 7PM-7AM, please contact night-coverage [email or web address]     [1]  Allergies Allergen Reactions   Cefazolin  Dermatitis   Morphine  Other (See Comments)    Other reaction(s): Delusions (intolerance)  Other Reaction(s): delusions   Mushroom Extract Complex (Obsolete) Nausea Only

## 2024-03-18 NOTE — Plan of Care (Signed)

## 2024-03-18 NOTE — Procedures (Signed)
 PROCEDURE SUMMARY:  Successful US  guided right thoracentesis. Yielded 1.5 L of clear yellow fluid. Pt tolerated procedure well. No immediate complications.  Specimen sent for labs. CXR ordered; no post-procedure pneumothorax identified.   EBL < 2 mL  Warren JONELLE Dais, NP 03/18/2024 1:04 PM

## 2024-03-18 NOTE — TOC Transition Note (Addendum)
 Transition of Care [facility]) - Discharge Note   Patient Details  Name: [name] MRN: [id number] Date of Birth: June 07, 1950  Transition of Care [facility]) CM/SW Contact:  Landry DELENA Senters, RN Phone Number: 03/18/2024, 2:30 PM   Clinical Narrative:     Patient will be discharging to home with wife today, who will provide transportation. Wife will be coming to hospital soon to learn about wound care needs. CM did inform bedside nurse they will need supplies to get through until f/u appt.   Patient did tell CM he is able to go to Dr. Crist office M-F for dressing changes in the office. CM did speak with Dr. Harden, who reported patient is not able to do this and will need to complete wound care at home. CM did reinforce this with patient and he verbalizes understanding.   Patient attending outpatient rehab at [facility], which has already been arranged.   No other needs identified by CM.   Final next level of care: OP Rehab Barriers to Discharge: No Barriers Identified   Patient Goals and CMS Choice            Discharge Placement                       Discharge Plan and Services Additional resources added to the After Visit Summary for                                       Social Drivers of Health (SDOH) Interventions SDOH Screenings   Food Insecurity: No Food Insecurity (01/08/2024)  Housing: Low Risk (01/08/2024)  Transportation Needs: No Transportation Needs (01/08/2024)  Utilities: Not At Risk (01/08/2024)  Depression (PHQ2-9): Low Risk (12/22/2023)  Recent Concern: Depression (PHQ2-9) - Medium Risk (12/19/2023)  Financial Resource Strain: Low Risk (09/15/2023)  Physical Activity: Inactive (09/15/2023)  Social Connections: Moderately Isolated (01/08/2024)  Stress: No Stress Concern Present (09/15/2023)  Tobacco Use: Medium Risk (03/16/2024)  Health Literacy: Adequate Health Literacy (09/15/2023)     Readmission Risk Interventions    01/09/2024    2:52 PM 10/29/2023     3:12 PM 07/29/2022   10:39 AM  Readmission Risk Prevention Plan  Transportation Screening Complete Complete Complete  PCP or Specialist Appt within 3-5 Days  Complete   HRI or Home Care Consult Complete Complete   Palliative Care Screening Not Applicable Not Applicable   Medication Review (RN Care Manager) Complete Complete   PCP or Specialist appointment within 3-5 days of discharge   Complete  SW Recovery Care/Counseling Consult   Complete  Skilled Nursing Facility   Not Applicable

## 2024-03-18 NOTE — Telephone Encounter (Signed)
 Beth from the hospital called. She would like wound care orders. [phone]

## 2024-03-18 NOTE — TOC Initial Note (Addendum)
 Transition of Care [facility]) - Initial/Assessment Note    Patient Details  Name: [name] MRN: [id number] Date of Birth: 1950-06-06  Transition of Care [facility]) CM/SW Contact:    Landry DELENA Senters, RN Phone Number: 03/18/2024, 10:28 AM  Clinical Narrative:                 RR:[id number] of CAD status post CABG, diabetes mellitus type 2, ESRD on hemodialysis Tuesday Thursday Saturday, history of atrial flutter status post ablation on December 2021 now in permanent A-fib unable to tolerate anticoagulation due to severe GI bleed, bilateral BKA, cirrhosis of the liver secondary to [facility] patient is to the ER because of increasing weakness fatigue and over the last 2 to 3 days has multiple episodes of watery diarrhea   Patient lives with wife, who provides support and transportation needs at home. Wife will provide transportation home at d/c.  DME reviewed-W/C, shower seat, has PCP, manages own medications.  Patient attends OP therapy at [facility].   CM will continue to follow.   Expected Discharge Plan:  (TBD) Barriers to Discharge: Continued Medical Work up   Patient Goals and CMS Choice            Expected Discharge Plan and Services       Living arrangements for the past 2 months: Single Family Home                                      Prior Living Arrangements/Services Living arrangements for the past 2 months: Single Family Home Lives with:: Self, Spouse Patient language and need for interpreter reviewed:: Yes Do you feel safe going back to the place where you live?: Yes      Need for Family Participation in Patient Care: Yes (Comment) Care giver support system in place?: Yes (comment) Current home services: DME (W/C, shower seat, leg prosthetic) Criminal Activity/Legal Involvement Pertinent to Current Situation/Hospitalization: No - Comment as needed  Activities of Daily Living   ADL Screening (condition at time of admission) Independently performs ADLs?: Yes  (appropriate for developmental age)  Permission Sought/Granted                  Emotional Assessment Appearance:: Developmentally appropriate Attitude/Demeanor/Rapport: Engaged Affect (typically observed): Calm Orientation: : Oriented to Self, Oriented to Place, Oriented to  Time, Oriented to Situation Alcohol  / Substance Use: Not Applicable Psych Involvement: No (comment)  Admission diagnosis:  Pleural effusion [J90] Other ascites [R18.8] Generalized weakness [R53.1] Fluid overload [E87.70] Diarrhea, unspecified type [R19.7] Acute cough [R05.1] Patient Active Problem List   Diagnosis Date Noted   Amputation stump infection (HCC) 03/17/2024   Fluid overload 03/16/2024   Diarrhea 03/16/2024   SIRS (systemic inflammatory response syndrome) (HCC) 01/08/2024   Hypotension 01/08/2024   (HFpEF) heart failure with preserved ejection fraction (HCC) 01/08/2024   Right knee pain 01/08/2024   Gastroesophageal reflux disease without esophagitis 12/22/2023   CAP (community acquired pneumonia) 10/29/2023   Acute respiratory failure with hypoxia (HCC) 05/05/2023   Ascites 05/05/2023   Hyponatremia 05/05/2023   Right below-knee amputee (HCC) 07/29/2022   Sepsis due to pneumonia (HCC) 07/23/2022   Foot infection 07/22/2022   Acute respiratory failure (HCC) 07/22/2022   Anemia due to chronic blood loss 06/23/2022   Heme positive stool 06/23/2022   Benign neoplasm of cecum 06/23/2022   Benign neoplasm of transverse colon 06/23/2022   Benign neoplasm  of descending colon 06/23/2022   Abscess of right foot 05/17/2022   Contraindication to anticoagulation therapy GU Bleed 03/13/2022   Permanent atrial fibrillation (HCC) 08/18/2021   Anemia of chronic renal failure 08/18/2021   Thrombocytopenia 08/18/2021   Encephalopathy 08/18/2021   Action induced myoclonus 08/18/2021   Dehiscence of amputation stump of left lower extremity (HCC)    Left below-knee amputee (HCC) 05/11/2021   Anemia  of chronic disease 09/13/2020   COVID-19 virus infection 09/13/2020   Pressure injury of skin 06/29/2020   Gross hematuria 06/16/2020   Typical atrial flutter (HCC) 03/24/2020   Bilateral pleural effusion 02/24/2020   Healthcare maintenance 01/28/2020   Shortness of breath 01/28/2020   History of partial ray amputation of fourth toe of right foot 09/08/2019   Chronic cough 06/25/2019   Cutaneous abscess of right foot    Subacute osteomyelitis, right ankle and foot (HCC) 12/14/2018   AKI (acute kidney injury) 12/14/2018   ESRD on dialysis (HCC) 12/14/2018   S/P CABG (coronary artery bypass graft) 12/11/2018   Gait abnormality 08/24/2018   Diabetic neuropathy (HCC) 02/06/2018   Cervical myelopathy (HCC) 02/06/2018   Onychomycosis 10/30/2017   Other spondylosis with radiculopathy, cervical region 01/27/2017   Midfoot ulcer, right, limited to breakdown of skin (HCC) 11/15/2016   Lateral epicondylitis, left elbow 08/12/2016   Prostate cancer (HCC) 06/19/2016   Cellulitis of right foot 12/24/2015   Gout 09/14/2012   Essential hypertension 09/14/2012   Hypothyroidism 09/14/2012   CAD (coronary artery disease) 09/14/2012   Insulin  dependent type 2 diabetes mellitus (HCC) 09/14/2012   Hyperlipidemia associated with type 2 diabetes mellitus (HCC)    Neuropathy (HCC)    PCP:  [name], NP Pharmacy:   [facility] [phone] - [place], [place] - [address] AT [address] OF [facility] [address] [place] [address] Phone: [phone] Fax: [phone]  [id number] - [place], [place] - [address] [place] [address] Phone: [phone] Fax: [phone]     Social Drivers of Health (SDOH) Social History: SDOH Screenings   Food Insecurity: No Food Insecurity (01/08/2024)  Housing: Low Risk (01/08/2024)  Transportation Needs: No Transportation Needs (01/08/2024)  Utilities: Not At Risk (01/08/2024)   Depression (PHQ2-9): Low Risk (12/22/2023)  Recent Concern: Depression (PHQ2-9) - Medium Risk (12/19/2023)  Financial Resource Strain: Low Risk (09/15/2023)  Physical Activity: Inactive (09/15/2023)  Social Connections: Moderately Isolated (01/08/2024)  Stress: No Stress Concern Present (09/15/2023)  Tobacco Use: Medium Risk (03/16/2024)  Health Literacy: Adequate Health Literacy (09/15/2023)   SDOH Interventions:     Readmission Risk Interventions    01/09/2024    2:52 PM 10/29/2023    3:12 PM 07/29/2022   10:39 AM  Readmission Risk Prevention Plan  Transportation Screening Complete Complete Complete  PCP or Specialist Appt within 3-5 Days  Complete   HRI or Home Care Consult Complete Complete   Palliative Care Screening Not Applicable Not Applicable   Medication Review (RN Care Manager) Complete Complete   PCP or Specialist appointment within 3-5 days of discharge   Complete  SW Recovery Care/Counseling Consult   Complete  Skilled Nursing Facility   Not Applicable

## 2024-03-18 NOTE — Telephone Encounter (Signed)
 Pt is currently in the hospital. Asking for wound care orders see below.

## 2024-03-18 NOTE — Plan of Care (Signed)
 [facility] Patient Discharge Orders- [facility] CLINIC: [facility]   Patient's name: [name] Admit/DC Dates: 03/16/2024 - 03/18/2024  Discharge Diagnoses: Fluid overload. Received HD, paracentesis, and thoracentesis this admission   BKA/stump infection. Continue IV Vancomycin    Outpatient Dialysis Orders:  -Heparin : No change -EDW No change   -Bath: No change   Anemia Aranesp : Given: --   Date of last dose/amount: --   PRBC's Given: -- Date/# of units: -- ESA dose for discharge: per protocol   Recent Labs  Lab 03/17/24 2014  HGB 10.7*  K 4.7  CALCIUM  8.3*  ALBUMIN  2.2*    Access intervention/Change: none    Medications: -IV Antibiotics:    Vancomycin  1 g IV q HD until 03/27/24 for stump infection    OTHER/APPTS/LABS   Completed by: Maisie Ronnald Acosta PA-C   D/C Meds to be reconciled by nurse after every discharge.    Reviewed by: MD:______ RN_______

## 2024-03-19 ENCOUNTER — Ambulatory Visit (HOSPITAL_COMMUNITY)

## 2024-03-19 ENCOUNTER — Ambulatory Visit: Admitting: Family Medicine

## 2024-03-19 ENCOUNTER — Telehealth: Payer: Self-pay

## 2024-03-19 NOTE — Transitions of Care (Post Inpatient/ED Visit) (Signed)
° °  03/19/2024  Name: [name] MRN: [id number] DOB: 1950/12/25  Today's TOC FU Call Status: Today's TOC FU Call Status:: Unsuccessful Call (1st Attempt) Unsuccessful Call (1st Attempt) Date: 03/19/24  Attempted to reach the patient regarding the most recent Inpatient/ED visit.  Follow Up Plan: Additional outreach attempts will be made to reach the patient to complete the Transitions of Care (Post Inpatient/ED visit) call.   Rosaura Bolon J. Shakita Keir RN, MSN [facility], [facility] RN Care Manager Direct Dial: [phone]  Fax: [phone] Website: [email or web address]

## 2024-03-21 ENCOUNTER — Other Ambulatory Visit: Payer: Self-pay | Admitting: Family

## 2024-03-22 ENCOUNTER — Telehealth: Payer: Self-pay

## 2024-03-22 LAB — CULTURE, BLOOD (ROUTINE X 2)
Culture: NO GROWTH
Culture: NO GROWTH
Special Requests: ADEQUATE
Special Requests: ADEQUATE

## 2024-03-22 NOTE — Transitions of Care (Post Inpatient/ED Visit) (Signed)
° °  03/22/2024  Name: [name] MRN: [id number] DOB: 13-Sep-1950  Today's TOC FU Call Status: Today's TOC FU Call Status:: Unsuccessful Call (2nd Attempt) TOC FU Call Complete Date: 03/22/24  Attempted to reach the patient regarding the most recent Inpatient/ED visit.  Placed call to patient who reports that he is at an MD appointment and to call back in 1 hour.  Follow Up Plan: Additional outreach attempts will be made to reach the patient to complete the Transitions of Care (Post Inpatient/ED visit) call.   Alan Ee, RN, BSN, CEN [employer]- Transition of Care Team.  [facility] [phone]

## 2024-03-22 NOTE — Transitions of Care (Post Inpatient/ED Visit) (Signed)
° °  03/22/2024  Name: [name] MRN: [id number] DOB: [birth year]  Today's TOC FU Call Status: Today's TOC FU Call Status:: Unsuccessful Call (3rd Attempt) Unsuccessful Call (3rd Attempt) Date: 03/22/24 (2nd call attempt at time patient requested.) TOC FU Call Complete Date: 03/22/24 (unable to talk. call back in 1 hour.)  Attempted to reach the patient regarding the most recent Inpatient/ED visit.  Follow Up Plan: No further outreach attempts will be made at this time. We have been unable to contact the patient. Alan Ee, RN, BSN, CEN [employer]- Transition of Care Team.  [facility] [phone]

## 2024-03-25 ENCOUNTER — Ambulatory Visit: Admitting: Physician Assistant

## 2024-03-25 ENCOUNTER — Encounter: Payer: Self-pay | Admitting: Physician Assistant

## 2024-03-25 DIAGNOSIS — Z89512 Acquired absence of left leg below knee: Secondary | ICD-10-CM

## 2024-03-25 DIAGNOSIS — L97921 Non-pressure chronic ulcer of unspecified part of left lower leg limited to breakdown of skin: Secondary | ICD-10-CM | POA: Diagnosis not present

## 2024-03-25 DIAGNOSIS — S88112S Complete traumatic amputation at level between knee and ankle, left lower leg, sequela: Secondary | ICD-10-CM

## 2024-03-25 DIAGNOSIS — T8789 Other complications of amputation stump: Secondary | ICD-10-CM

## 2024-03-25 NOTE — Progress Notes (Signed)
 Office Visit Note   Patient: [name]           Date of Birth: 06/14/1950           MRN: [id number] Visit Date: 03/25/2024              Requested by: [name], NP [address] [place],  [place] [address] PCP: [name], NP  Chief Complaint  Patient presents with   Left Leg - Follow-up    HX BKA bilaterally    Right Leg - Follow-up      HPI: 73 y/o male who has left BKA with edema and weeping clear fluid. He has ESRD with fluid over load. He states he just sits in his WC all day with his legs hanging down. He is so disappointed that he can't wear his left prosthesis right now.   He was hospitalized during Dec 2025.    Assessment & Plan: Visit Diagnoses:  1. Below-knee amputation of left lower extremity with complication, sequela   2. Edema of amputation stump of left lower extremity (HCC)   3. Skin ulcer of multiple sites of left lower extremity, limited to breakdown of skin (HCC)     Plan: Vancomycin  can be discontinued.  He was suppose to be for 1 week as of 03/18/24.  He will continue to shower with soap and water .  Clean the wound with Vashe and wear the vive stump sock directly onto the skin.  Elevation is key.  He was asked to lay down and elevate the left Stump above his heart.  He often falls asleep in his WC with the stump in a dependent position.  He is going by [employer] for prostheis exam and recommendations.    Follow-Up Instructions: Return in about 3 weeks (around 04/15/2024).   Ortho Exam  Patient is alert, oriented, no adenopathy, well-dressed, normal affect, normal respiratory effort. The wound measures 2 cm x 3.5 cm on the stump base.  No cellulitis, but he does have edema in the stump and the distal 1/2 of the stump is cooler to touch.      Imaging: No results found. No images are attached to the encounter.  Labs: Lab Results  Component Value Date   HGBA1C 4.9 10/29/2023   HGBA1C 5.0 05/17/2022   HGBA1C 5.5 05/04/2021    ESRSEDRATE 28 (H) 02/18/2024   ESRSEDRATE 34 (H) 01/24/2020   ESRSEDRATE 41 (H) 01/10/2020   CRP 24.8 (H) 02/18/2024   CRP 8.8 (H) 01/24/2020   CRP 35.9 (H) 01/10/2020   LABURIC 4.2 01/09/2024   REPTSTATUS 03/17/2024 FINAL 03/17/2024   GRAMSTAIN  03/17/2024    RARE WBC PRESENT, PREDOMINANTLY PMN NO ORGANISMS SEEN Performed at [facility], [address] [place], [place] [address]    CULT  03/16/2024    NO GROWTH 5 DAYS Performed at [facility], [address] [place], [place] [address]    [facility] STAPHYLOCOCCUS AUREUS 01/11/2024     Lab Results  Component Value Date   ALBUMIN  2.2 (L) 03/17/2024   ALBUMIN  2.7 (L) 03/16/2024   ALBUMIN  2.7 (L) 03/16/2024   PREALBUMIN 32.7 05/09/2021   PREALBUMIN 14.8 (L) 12/14/2018    Lab Results  Component Value Date   MG 2.0 01/15/2024   MG 2.0 01/13/2024   MG 2.2 01/12/2024   Lab Results  Component Value Date   VD25OH 42.0 11/19/2021   VD25OH 16.13 (L) 05/09/2021    Lab Results  Component Value Date   PREALBUMIN 32.7 05/09/2021   PREALBUMIN 14.8 (L) 12/14/2018      Latest Ref Rng & Units 03/17/2024    8:14 PM 03/16/2024   11:09 PM 03/16/2024    5:42 PM  CBC EXTENDED  WBC 4.0 - 10.5 K/uL 4.9  5.3    RBC 4.22 - 5.81 MIL/uL 3.69  4.20    Hemoglobin 13.0 - 17.0 g/dL 89.2  87.9  87.3   HCT 39.0 - 52.0 % 32.9  38.0  37.0   Platelets 150 - 400 K/uL 127  130    NEUT# 1.7 - 7.7 K/uL 3.5     Lymph# 0.7 - 4.0 K/uL 0.4        There is no height or weight on file to calculate BMI.  Orders:  No orders of the defined types were placed in this encounter.  No orders of the defined types were placed in this encounter.    Procedures: No procedures performed  Clinical Data: No additional findings.  ROS:  All other systems negative, except as noted in the HPI. Review of Systems  Objective: Vital Signs: There were no vitals taken for this visit.  Specialty Comments:  No specialty comments  available.  PMFS History: Patient Active Problem List   Diagnosis Date Noted   Amputation stump infection (HCC) 03/17/2024   Fluid overload 03/16/2024   Diarrhea 03/16/2024   SIRS (systemic inflammatory response syndrome) (HCC) 01/08/2024   Hypotension 01/08/2024   (HFpEF) heart failure with preserved ejection fraction (HCC) 01/08/2024   Right knee pain 01/08/2024   Gastroesophageal reflux disease without esophagitis 12/22/2023   CAP (community acquired pneumonia) 10/29/2023   Acute respiratory failure with hypoxia (HCC) 05/05/2023   Ascites 05/05/2023   Hyponatremia 05/05/2023   Right below-knee amputee (HCC) 07/29/2022   Sepsis due to pneumonia (HCC) 07/23/2022   Foot infection 07/22/2022   Acute respiratory failure (HCC) 07/22/2022   Anemia due to chronic blood loss 06/23/2022   Heme positive stool 06/23/2022   Benign neoplasm of cecum 06/23/2022   Benign neoplasm of transverse colon 06/23/2022   Benign neoplasm of descending colon 06/23/2022   Abscess of right foot 05/17/2022   Contraindication to anticoagulation therapy GU Bleed 03/13/2022   Permanent atrial fibrillation (HCC) 08/18/2021   Anemia of chronic renal failure 08/18/2021   Thrombocytopenia 08/18/2021   Encephalopathy 08/18/2021   Action induced myoclonus 08/18/2021   Dehiscence of amputation stump of left lower extremity (HCC)    Left below-knee amputee (HCC) 05/11/2021   Anemia of chronic disease 09/13/2020   COVID-19 virus infection 09/13/2020   Pressure injury of skin 06/29/2020   Gross hematuria 06/16/2020   Typical atrial flutter (HCC) 03/24/2020   Bilateral pleural effusion 02/24/2020   Healthcare maintenance 01/28/2020   Shortness of breath 01/28/2020   History of partial ray amputation of fourth toe of right foot 09/08/2019   Chronic cough 06/25/2019   Cutaneous abscess of right foot    Subacute osteomyelitis, right ankle and foot (HCC) 12/14/2018   AKI (acute kidney injury) 12/14/2018   ESRD on  dialysis (HCC) 12/14/2018   S/P CABG (coronary artery bypass graft) 12/11/2018   Gait abnormality 08/24/2018   Diabetic neuropathy (HCC) 02/06/2018   Cervical myelopathy (HCC) 02/06/2018   Onychomycosis 10/30/2017   Other spondylosis with radiculopathy, cervical region 01/27/2017   Midfoot ulcer, right, limited to breakdown of skin (HCC) 11/15/2016   Lateral epicondylitis, left elbow 08/12/2016   Prostate cancer (HCC) 06/19/2016   Cellulitis of  right foot 12/24/2015   Gout 09/14/2012   Essential hypertension 09/14/2012   Hypothyroidism 09/14/2012   CAD (coronary artery disease) 09/14/2012   Insulin  dependent type 2 diabetes mellitus (HCC) 09/14/2012   Hyperlipidemia associated with type 2 diabetes mellitus (HCC)    Neuropathy (HCC)    Past Medical History:  Diagnosis Date   Acute osteomyelitis of metatarsal bone of left foot (HCC)    Ambulates with cane    straight cane   Anemia    Cervical myelopathy (HCC) 02/06/2018   Chronic kidney disease    dailysis [facility]- home   Community acquired pneumonia of left lower lobe of lung 08/18/2021   Complication of anesthesia    Coronary artery disease    Dehiscence of amputation stump of left lower extremity (HCC)    Diabetes mellitus without complication (HCC)    type2   Diabetic foot ulcer (HCC)    Diabetic neuropathy (HCC) 02/06/2018   Dysrhythmia    Gait abnormality 08/24/2018   GERD (gastroesophageal reflux disease)     01/06/20- not current   Gout    History of blood transfusion    History of blood transfusion    History of kidney stones    passed stones   Hypercholesteremia    Hypertension    Hypothyroidism    Neuromuscular disorder (HCC)    neuropathy left leg and bilateral feet   Neuropathy    Partial nontraumatic amputation of foot, 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   PONV (postoperative nausea and vomiting)    Prostate cancer (HCC)    PVD (peripheral vascular disease)    with amputations    Family History  Problem Relation  Age of Onset   Diabetes Mellitus II Mother    Kidney disease Mother    Diabetes Mellitus II Father    CAD Father    Cancer Father        prostate   Kidney disease Father    Diabetes Mellitus II Brother    Kidney disease Brother    Diabetes Mellitus II Brother    Stomach cancer Brother 25   Kidney disease Brother    Colon cancer Neg Hx    Colon polyps Neg Hx    Esophageal cancer Neg Hx    Rectal cancer Neg Hx    Pancreatic cancer Neg Hx     Past Surgical History:  Procedure Laterality Date   A-FLUTTER ABLATION N/A 04/06/2020   Procedure: A-FLUTTER ABLATION;  Surgeon: Waddell Danelle ORN, MD;  Location: [facility] INVASIVE CV LAB;  Service: Cardiovascular;  Laterality: N/A;   A/V FISTULAGRAM N/A 03/05/2023   Procedure: A/V Fistulagram;  Surgeon: Pearline [name] RAMAN, MD;  Location: [facility] INVASIVE CV LAB;  Service: Vascular;  Laterality: N/A;   A/V SHUNT INTERVENTION Left 11/07/2023   Procedure: A/V SHUNT INTERVENTION;  Surgeon: Gretta Lonni PARAS, MD;  Location: [facility] PV LAB;  Service: Cardiovascular;  Laterality: Left;   AMPUTATION Left 12/25/2013   Procedure: AMPUTATION RAY LEFT 5TH RAY;  Surgeon: Jerona Harden GAILS, MD;  Location: [facility] ORS;  Service: Orthopedics;  Laterality: Left;   AMPUTATION Right 12/15/2018   Procedure: AMPUTATION OF 4TH AND 5TH TOES RIGHT FOOT;  Surgeon: Harden Jerona GAILS, MD;  Location: [facility] OR;  Service: Orthopedics;  Laterality: Right;   AMPUTATION Left 02/02/2021   Procedure: LEFT FOOT 4TH RAY AMPUTATION;  Surgeon: Harden Jerona GAILS, MD;  Location: [facility] OR;  Service: Orthopedics;  Laterality: Left;   AMPUTATION Left 05/09/2021   Procedure: LEFT BELOW KNEE AMPUTATION;  Surgeon: Harden Jerona GAILS, MD;  Location: [facility] OR;  Service: Orthopedics;  Laterality: Left;   AMPUTATION Right 07/26/2022   Procedure: RIGHT BELOW KNEE AMPUTATION;  Surgeon: Harden Jerona GAILS, MD;  Location: [facility] OR;  Service: Orthopedics;  Laterality: Right;   APPLICATION OF WOUND VAC Right 12/15/2018   Procedure: APPLICATION OF  WOUND VAC;  Surgeon: Harden Jerona GAILS, MD;  Location: [facility] OR;  Service: Orthopedics;  Laterality: Right;   AV FISTULA PLACEMENT Left 03/25/2019   Procedure: LEFT ARM ARTERIOVENOUS (AV) FISTULA CREATION;  Surgeon: Serene Gaile ORN, MD;  Location: [facility] OR;  Service: Vascular;  Laterality: Left;   BACK SURGERY     BASCILIC VEIN TRANSPOSITION Left 05/20/2019   Procedure: SECOND STAGE LEFT BASCILIC VEIN TRANSPOSITION;  Surgeon: Serene Gaile ORN, MD;  Location: [facility] OR;  Service: Vascular;  Laterality: Left;   BIOPSY  06/22/2022   Procedure: BIOPSY;  Surgeon: Wilhelmenia Aloha Raddle., MD;  Location: [facility] ENDOSCOPY;  Service: Gastroenterology;;   CARDIAC CATHETERIZATION  02/17/2014   CATARACT EXTRACTION Bilateral    CHOLECYSTECTOMY     COLONOSCOPY  2011   in [place], Normal   COLONOSCOPY N/A 06/23/2022   Procedure: COLONOSCOPY;  Surgeon: Legrand Victory LITTIE DOUGLAS, MD;  Location: [facility] ENDOSCOPY;  Service: Gastroenterology;  Laterality: N/A;   CORONARY ARTERY BYPASS GRAFT  2008   CYSTOSCOPY N/A 06/29/2020   Procedure: CYSTOSCOPY WITH CLOT EVACUATION AND  FULGERATION;  Surgeon: Matilda Senior, MD;  Location: [facility] OR;  Service: Urology;  Laterality: N/A;   CYSTOSCOPY WITH FULGERATION Bilateral 06/17/2020   Procedure: CYSTOSCOPY,BILATERAL RETROGRADE, CLOT EVACUATION WITH FULGERATION OF THE BLADDER;  Surgeon: Selma Donnice SAUNDERS, MD;  Location: [facility] OR;  Service: Urology;  Laterality: Bilateral;   CYSTOSCOPY WITH FULGERATION N/A 06/28/2020   Procedure: CYSTOSCOPY WITH CLOT EVACUATION AND FULGERATION OF BLEEDERS;  Surgeon: Matilda Senior, MD;  Location: [name] OR;  Service: Urology;  Laterality: N/A;  1 HR   ENTEROSCOPY N/A 06/22/2022   Procedure: ENTEROSCOPY;  Surgeon: Wilhelmenia Aloha Raddle., MD;  Location: [facility] ENDOSCOPY;  Service: Gastroenterology;  Laterality: N/A;   HEMOSTASIS CLIP PLACEMENT  06/22/2022   Procedure: HEMOSTASIS CLIP PLACEMENT;  Surgeon: Wilhelmenia Aloha Raddle., MD;  Location: [facility] ENDOSCOPY;  Service:  Gastroenterology;;   HOT HEMOSTASIS N/A 06/22/2022   Procedure: HOT HEMOSTASIS (ARGON PLASMA COAGULATION/BICAP);  Surgeon: Wilhelmenia Aloha Raddle., MD;  Location: [facility] ENDOSCOPY;  Service: Gastroenterology;  Laterality: N/A;   I & D EXTREMITY Right 12/15/2018   Procedure: DEBRIDEMENT RIGHT FOOT;  Surgeon: Harden Jerona GAILS, MD;  Location: [facility] OR;  Service: Orthopedics;  Laterality: Right;   I & D EXTREMITY Right 08/27/2019   Procedure: PARTIAL CUBOID EXCISION RIGHT FOOT;  Surgeon: Harden Jerona GAILS, MD;  Location: [facility] OR;  Service: Orthopedics;  Laterality: Right;   I & D EXTREMITY Right 01/07/2020   Procedure: RIGHT FOOT EXCISION INFECTED BONE;  Surgeon: Harden Jerona GAILS, MD;  Location: [facility] OR;  Service: Orthopedics;  Laterality: Right;   I & D EXTREMITY Right 05/17/2022   Procedure: IRRIGATION AND DEBRIDEMENT RIGHT FOOT ABSCESS;  Surgeon: Harden Jerona GAILS, MD;  Location: [facility] OR;  Service: Orthopedics;  Laterality: Right;   IR PARACENTESIS  10/11/2022   IR PARACENTESIS  12/24/2022   IR PARACENTESIS  02/06/2023   IR PARACENTESIS  05/05/2023   IR PARACENTESIS  05/19/2023   IR PARACENTESIS  07/11/2023   IR PARACENTESIS  07/30/2023   IR PARACENTESIS  08/20/2023   IR PARACENTESIS  09/19/2023   IR PARACENTESIS  10/03/2023  IR PARACENTESIS  10/17/2023   IR PARACENTESIS  10/30/2023   IR PARACENTESIS  11/19/2023   IR PARACENTESIS  12/03/2023   IR PARACENTESIS  12/16/2023   IR PARACENTESIS  01/12/2024   IR PARACENTESIS  02/06/2024   IR PARACENTESIS  02/20/2024   IR PARACENTESIS  03/03/2024   IR PARACENTESIS  03/17/2024   IR THORACENTESIS ASP PLEURAL SPACE W/IMG GUIDE  03/18/2024   NECK SURGERY     novemver 2019   PERIPHERAL VASCULAR BALLOON ANGIOPLASTY Left 03/05/2023   Procedure: PERIPHERAL VASCULAR BALLOON ANGIOPLASTY;  Surgeon: Pearline [name] RAMAN, MD;  Location: [facility] INVASIVE CV LAB;  Service: Vascular;  Laterality: Left;  AVF   POLYPECTOMY  06/22/2022   Procedure: POLYPECTOMY;  Surgeon: Mansouraty, Aloha Raddle.,  MD;  Location: [facility] ENDOSCOPY;  Service: Gastroenterology;;   POLYPECTOMY  06/23/2022   Procedure: POLYPECTOMY;  Surgeon: Legrand Victory LITTIE DOUGLAS, MD;  Location: [facility] ENDOSCOPY;  Service: Gastroenterology;;   STUMP REVISION Left 06/27/2021   Procedure: REVISION LEFT BELOW KNEE AMPUTATION;  Surgeon: Harden Jerona GAILS, MD;  Location: [facility] OR;  Service: Orthopedics;  Laterality: Left;   SUBMUCOSAL LIFTING INJECTION  06/22/2022   Procedure: SUBMUCOSAL LIFTING INJECTION;  Surgeon: Wilhelmenia Aloha Raddle., MD;  Location: [facility] ENDOSCOPY;  Service: Gastroenterology;;   SUBMUCOSAL TATTOO INJECTION  06/22/2022   Procedure: SUBMUCOSAL TATTOO INJECTION;  Surgeon: Wilhelmenia Aloha Raddle., MD;  Location: [facility] ENDOSCOPY;  Service: Gastroenterology;;   TRANSURETHRAL RESECTION OF PROSTATE N/A 06/28/2020   Procedure: TRANSURETHRAL RESECTION OF THE PROSTATE (TURP);  Surgeon: Matilda Senior, MD;  Location: [facility] OR;  Service: Urology;  Laterality: N/A;   VENOUS ANGIOPLASTY  11/07/2023   Procedure: VENOUS ANGIOPLASTY;  Surgeon: Gretta Lonni PARAS, MD;  Location: [facility] LAB;  Service: Cardiovascular;;  Basilic Vein, Subclavian Vein   WISDOM TOOTH EXTRACTION     Social History   Occupational History   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  Tobacco Use   Smoking status: Former    Types: Cigars    Start date: 04/08/1973    Quit date: 12/24/1988    Years since quitting: 35.2   Smokeless tobacco: Never   Tobacco comments:    Cigars and Pipe   Vaping Use   Vaping status: Never Used  Substance and Sexual Activity   Alcohol  use: Never   Drug use: Never   Sexual activity: Yes    Partners: Female

## 2024-03-30 ENCOUNTER — Ambulatory Visit (HOSPITAL_COMMUNITY)
Admission: RE | Admit: 2024-03-30 | Discharge: 2024-03-30 | Disposition: A | Discharge status: Home / Self Care | Source: Ambulatory Visit | Attending: Nephrology | Admitting: Nephrology

## 2024-03-30 DIAGNOSIS — R188 Other ascites: Secondary | ICD-10-CM | POA: Insufficient documentation

## 2024-03-30 HISTORY — PX: IR PARACENTESIS: IMG2679

## 2024-03-30 MED ORDER — LIDOCAINE-EPINEPHRINE 1 %-1:100000 IJ SOLN
10.0000 mL | Freq: Once | INTRAMUSCULAR | Status: AC
  Administered 2024-03-30: 10 mL via INTRADERMAL

## 2024-03-30 MED ORDER — LIDOCAINE-EPINEPHRINE 1 %-1:100000 IJ SOLN
INTRAMUSCULAR | Status: AC
  Filled 2024-03-30: qty 20

## 2024-03-30 NOTE — Procedures (Signed)
 PROCEDURE SUMMARY:  Successful image-guided paracentesis from the left abdomen.  Yielded 2.2 liters of clear, straw-colored peritoneal fluid.  No immediate complications.  EBL: zero Patient tolerated well.   Please see imaging section of Epic for full dictation.  [facility] Geordan Xu PA-C 03/30/2024 10:28 AM

## 2024-04-12 ENCOUNTER — Encounter: Payer: Self-pay | Admitting: Internal Medicine

## 2024-04-12 ENCOUNTER — Ambulatory Visit: Admitting: Internal Medicine

## 2024-04-12 ENCOUNTER — Other Ambulatory Visit: Payer: Self-pay

## 2024-04-12 DIAGNOSIS — Z89512 Acquired absence of left leg below knee: Secondary | ICD-10-CM

## 2024-04-12 DIAGNOSIS — R21 Rash and other nonspecific skin eruption: Secondary | ICD-10-CM

## 2024-04-12 DIAGNOSIS — Z992 Dependence on renal dialysis: Secondary | ICD-10-CM | POA: Diagnosis not present

## 2024-04-12 DIAGNOSIS — L03115 Cellulitis of right lower limb: Secondary | ICD-10-CM

## 2024-04-12 DIAGNOSIS — M71561 Other bursitis, not elsewhere classified, right knee: Secondary | ICD-10-CM | POA: Diagnosis not present

## 2024-04-12 DIAGNOSIS — N186 End stage renal disease: Secondary | ICD-10-CM | POA: Diagnosis not present

## 2024-04-12 DIAGNOSIS — L03116 Cellulitis of left lower limb: Secondary | ICD-10-CM

## 2024-04-12 DIAGNOSIS — L03818 Cellulitis of other sites: Secondary | ICD-10-CM

## 2024-04-12 NOTE — Patient Instructions (Signed)
 Continue to follow with ortho F/U with ID PRN

## 2024-04-12 NOTE — Progress Notes (Signed)
 "     Patient: [name]  DOB: 1950/12/13 MRN: [id number] PCP: [name], NP    Chief Complaint  Patient presents with   Follow-up     Patient Active Problem List   Diagnosis Date Noted   Amputation stump infection (HCC) 03/17/2024   Fluid overload 03/16/2024   Diarrhea 03/16/2024   SIRS (systemic inflammatory response syndrome) (HCC) 01/08/2024   Hypotension 01/08/2024   (HFpEF) heart failure with preserved ejection fraction (HCC) 01/08/2024   Right knee pain 01/08/2024   Gastroesophageal reflux disease without esophagitis 12/22/2023   CAP (community acquired pneumonia) 10/29/2023   Acute respiratory failure with hypoxia (HCC) 05/05/2023   Ascites 05/05/2023   Hyponatremia 05/05/2023   Right below-knee amputee (HCC) 07/29/2022   Sepsis due to pneumonia (HCC) 07/23/2022   Foot infection 07/22/2022   Acute respiratory failure (HCC) 07/22/2022   Anemia due to chronic blood loss 06/23/2022   Heme positive stool 06/23/2022   Benign neoplasm of cecum 06/23/2022   Benign neoplasm of transverse colon 06/23/2022   Benign neoplasm of descending colon 06/23/2022   Abscess of right foot 05/17/2022   Contraindication to anticoagulation therapy GU Bleed 03/13/2022   Permanent atrial fibrillation (HCC) 08/18/2021   Anemia of chronic renal failure 08/18/2021   Thrombocytopenia 08/18/2021   Encephalopathy 08/18/2021   Action induced myoclonus 08/18/2021   Dehiscence of amputation stump of left lower extremity (HCC)    Left below-knee amputee (HCC) 05/11/2021   Anemia of chronic disease 09/13/2020   COVID-19 virus infection 09/13/2020   Pressure injury of skin 06/29/2020   Gross hematuria 06/16/2020   Typical atrial flutter (HCC) 03/24/2020   Bilateral pleural effusion 02/24/2020   Healthcare maintenance 01/28/2020   Shortness of breath 01/28/2020   History of partial ray amputation of fourth toe of right foot 09/08/2019   Chronic cough 06/25/2019   Cutaneous abscess  of right foot    Subacute osteomyelitis, right ankle and foot (HCC) 12/14/2018   AKI (acute kidney injury) 12/14/2018   ESRD on dialysis (HCC) 12/14/2018   S/P CABG (coronary artery bypass graft) 12/11/2018   Gait abnormality 08/24/2018   Diabetic neuropathy (HCC) 02/06/2018   Cervical myelopathy (HCC) 02/06/2018   Onychomycosis 10/30/2017   Other spondylosis with radiculopathy, cervical region 01/27/2017   Midfoot ulcer, right, limited to breakdown of skin (HCC) 11/15/2016   Lateral epicondylitis, left elbow 08/12/2016   Prostate cancer (HCC) 06/19/2016   Cellulitis of right foot 12/24/2015   Gout 09/14/2012   Essential hypertension 09/14/2012   Hypothyroidism 09/14/2012   CAD (coronary artery disease) 09/14/2012   Insulin  dependent type 2 diabetes mellitus (HCC) 09/14/2012   Hyperlipidemia associated with type 2 diabetes mellitus (HCC)    Neuropathy (HCC)      Subjective:  [name] is a 74 y.o. M with past medical history of ESRD on HD, PAF not on AC due to GI bleed, chronic anemia, CAD status post CABG 2005, diabetes mellitus, PAD s/p bilateral BKA, recurrent ascites with frequent paracentesis presented on [place] (knee pain.  ID was engaged given concerns for right knee infection.  Found to have right knee septic bursitis.  Orthopedics was engaged patient underwent bedside aspiration with 2 cc of purulent fluid aspirated from the prepatellar septic bursa.  Ortho did not think patient is surgical management.  Cultures grew MSSA from aspiration..  Patient was discharged on 4 weeks antibiotics cefazolin  with HD.  There are some concern for rash to cefazolin /transferred from to vancomycin .  Patient  tolerating antibiotics.  He states that right knee is still feels red but he has had increased pressure on it.   Today 04/12/24: Doing well no new complaints.  Review of Systems  All other systems reviewed and are negative.   Past Medical History:  Diagnosis Date   Acute osteomyelitis of  metatarsal bone of left foot (HCC)    Ambulates with cane    straight cane   Anemia    Cervical myelopathy (HCC) 02/06/2018   Chronic kidney disease    dailysis M W F- home   Community acquired pneumonia of left lower lobe of lung 08/18/2021   Complication of anesthesia    Coronary artery disease    Dehiscence of amputation stump of left lower extremity (HCC)    Diabetes mellitus without complication (HCC)    type2   Diabetic foot ulcer (HCC)    Diabetic neuropathy (HCC) 02/06/2018   Dysrhythmia    Gait abnormality 08/24/2018   GERD (gastroesophageal reflux disease)     01/06/20- not current   Gout    History of blood transfusion    History of blood transfusion    History of kidney stones    passed stones   Hypercholesteremia    Hypertension    Hypothyroidism    Neuromuscular disorder (HCC)    neuropathy left leg and bilateral feet   Neuropathy    Partial nontraumatic amputation of foot, 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   PONV (postoperative nausea and vomiting)    Prostate cancer (HCC)    PVD (peripheral vascular disease)    with amputations    Outpatient Medications Prior to Visit  Medication Sig Dispense Refill   allopurinol  (ZYLOPRIM ) 100 MG tablet Take 2 tablets (200 mg total) by mouth daily. 30 tablet 0   levothyroxine  (SYNTHROID ) 112 MCG tablet Take 112 mcg by mouth daily before breakfast.     modafinil  (PROVIGIL ) 200 MG tablet Take 1 tablet (200 mg total) by mouth daily. 90 tablet 0   pantoprazole  (PROTONIX ) 40 MG tablet Take 1 tablet (40 mg total) by mouth daily. 90 tablet 2   pregabalin  (LYRICA ) 150 MG capsule Take 150 mg by mouth in the morning, at noon, in the evening, and at bedtime.     rosuvastatin  (CRESTOR ) 10 MG tablet Take 1 tablet (10 mg total) by mouth daily. 90 tablet 1   sevelamer  carbonate (RENVELA ) 800 MG tablet Take 800 mg by mouth 3 (three) times daily.     No facility-administered medications prior to visit.     Allergies[1]  Social  History[2]  Family History  Problem Relation Age of Onset   Diabetes Mellitus II Mother    Kidney disease Mother    Diabetes Mellitus II Father    CAD Father    Cancer Father        prostate   Kidney disease Father    Diabetes Mellitus II Brother    Kidney disease Brother    Diabetes Mellitus II Brother    Stomach cancer Brother 95   Kidney disease Brother    Colon cancer Neg Hx    Colon polyps Neg Hx    Esophageal cancer Neg Hx    Rectal cancer Neg Hx    Pancreatic cancer Neg Hx     Objective:   Vitals:   04/12/24 1338  BP: (!) 143/84  Pulse: 69  SpO2: 98%   There is no height or weight on file to calculate BMI.  Physical Exam Constitutional:  General: He is not in acute distress.    Appearance: He is normal weight. He is not toxic-appearing.  HENT:     Head: Normocephalic and atraumatic.     Right Ear: External ear normal.     Left Ear: External ear normal.     Nose: No congestion or rhinorrhea.     Mouth/Throat:     Mouth: Mucous membranes are moist.     Pharynx: Oropharynx is clear.  Eyes:     Extraocular Movements: Extraocular movements intact.     Conjunctiva/sclera: Conjunctivae normal.     Pupils: Pupils are equal, round, and reactive to light.  Cardiovascular:     Rate and Rhythm: Normal rate and regular rhythm.     Heart sounds: No murmur heard.    No friction rub. No gallop.  Pulmonary:     Effort: Pulmonary effort is normal.     Breath sounds: Normal breath sounds.  Abdominal:     General: Abdomen is flat. Bowel sounds are normal.     Palpations: Abdomen is soft.  Musculoskeletal:        General: No swelling. Normal range of motion.     Cervical back: Normal range of motion and neck supple.  Skin:    General: Skin is warm and dry.  Neurological:     General: No focal deficit present.     Mental Status: He is oriented to person, place, and time.  Psychiatric:        Mood and Affect: Mood normal.        Lab Results: Lab Results   Component Value Date   WBC 4.9 03/17/2024   HGB 10.7 (L) 03/17/2024   HCT 32.9 (L) 03/17/2024   MCV 89.2 03/17/2024   PLT 127 (L) 03/17/2024    Lab Results  Component Value Date   CREATININE 7.35 (H) 03/17/2024   BUN 39 (H) 03/17/2024   NA 143 03/17/2024   K 4.7 03/17/2024   CL 102 03/17/2024   CO2 22 03/17/2024    Lab Results  Component Value Date   ALT 9 03/17/2024   AST 18 03/17/2024   ALKPHOS 111 03/17/2024   BILITOT 1.1 03/17/2024     Assessment & Plan:  #Septic bursitis with cellulitis of right knee #Rash with cefazolin  #ESRD with HD - Patient was seen by orthopedics during hospitalization knee was aspirated cultures grew MSSA on 10/5.  Patient on ceftriaxone  then transition cefazolin  to complete 4 weeks of antibiotics.  On 10/14 patient developed rash with cefazolin  was switched to vancomycin  with HD -Patient states that overall he feels better.  He states right lower extremity stump is slightly erythematous but he thinks that is due to pressure.  Did not look actively infected.  As we do not have imaging right lower extremity and patient care concerned with stump erythema will get x-ray. - At  10/29 vancomycin  extended another 2 weeks.  Erythema at stump significantly improved/resolved .  X-ray of right knee showed soft tissue thickening distal stump without soft tissue emphysema with possible resorptive changes at margins at tib-fib.  Patient has received 6 weeks of antibiotic as/if there was osteo and has been treated    #LLE extremity TTA and cellulitis -He was hospitalized with fluid overload and cellulitis(12/9-12/11). MR tib /fib showed cutaneous irregularity and thickening at tip of BKA stump, cellulitis not excluded on the right. MR tib-fib left showed subcutaneous tissue with focal root region fluid measuring 2.6 X.8X 0.7 cm no evidence of osteomyelitis  felt to be chronic hematoma, abscess less likely, also noticed cutaneous irrelevantly and thickening of tip of  left BKA cellulitis not excluded. -Placed on vanc completed though 12/20 with HD -Doing well today, no signs of active infection he is wearing prosthesis and tolerating it .  -F/U with ID prn  Loney Stank, MD [facility]   04/12/2024  1:46 PM  I personally spent a total of 45 minutes in the care of the patient today including preparing to see the patient, getting/reviewing separately obtained history, performing a medically appropriate exam/evaluation, counseling and educating, documenting clinical information in the EHR, independently interpreting results, and communicating results.     [1]  Allergies Allergen Reactions   Cefazolin  Dermatitis   Morphine  Other (See Comments)    Other reaction(s): Delusions (intolerance)  Other Reaction(s): delusions   Mushroom Extract Complex (Obsolete) Nausea Only  [2]  Social History Tobacco Use   Smoking status: Former    Types: Cigars    Start date: 04/08/1973    Quit date: 12/24/1988    Years since quitting: 35.3   Smokeless tobacco: Never   Tobacco comments:    Cigars and Pipe   Vaping Use   Vaping status: Never Used  Substance Use Topics   Alcohol  use: Never   Drug use: Never   "

## 2024-04-15 ENCOUNTER — Inpatient Hospital Stay: Admitting: Family Medicine

## 2024-04-15 ENCOUNTER — Ambulatory Visit: Admitting: Orthopedic Surgery

## 2024-04-15 DIAGNOSIS — S88111A Complete traumatic amputation at level between knee and ankle, right lower leg, initial encounter: Secondary | ICD-10-CM

## 2024-04-15 DIAGNOSIS — S88112S Complete traumatic amputation at level between knee and ankle, left lower leg, sequela: Secondary | ICD-10-CM

## 2024-04-15 DIAGNOSIS — Z89511 Acquired absence of right leg below knee: Secondary | ICD-10-CM

## 2024-04-15 DIAGNOSIS — L97921 Non-pressure chronic ulcer of unspecified part of left lower leg limited to breakdown of skin: Secondary | ICD-10-CM | POA: Diagnosis not present

## 2024-04-15 DIAGNOSIS — Z89512 Acquired absence of left leg below knee: Secondary | ICD-10-CM

## 2024-04-19 ENCOUNTER — Encounter: Payer: Self-pay | Admitting: Orthopedic Surgery

## 2024-04-19 NOTE — Progress Notes (Signed)
 "  Office Visit Note   Patient: [name]           Date of Birth: 10-11-1950           MRN: [id number] Visit Date: 04/15/2024              Requested by: [name], NP [address] [place],  [place] [address] PCP: [name], NP  Chief Complaint  Patient presents with   Left Leg - Follow-up    HX bilat BKA    Right Leg - Follow-up      HPI: Discussed the use of AI scribe software for clinical note transcription with the patient, who gave verbal consent to proceed.  History of Present Illness [name] is a 73 year old male with right lower extremity amputation, chronic right foot ulcer, and bilateral varicose veins who presents for routine follow-up of his residual limbs and prosthetic fit.  He ambulates with bilateral prosthetic legs and has not had recent refitting, but plans to have adjustments today due to looseness of the straps. He is able to walk and participate in physical therapy, including extensive walking sessions, but has limited exertion prior to this visit.  He denies new ulcers or blisters except for a persistent ulcer on the plantar aspect of the right foot, which has been present for several months and is described as 'annoying.' He continues to wear stump shrinkers nightly and receives assistance with wound care, including cleaning and application of a black shrinker.  He experiences intermittent phantom limb pain, particularly nocturnally, which can be severe and significantly impacts his quality of life. He inquires about alternative treatments and is aware of the side effects of gabapentin  and pregabalin . No recent medication changes or dose adjustments have occurred, and he is monitored for hepatic function.  He has bilateral varicose veins and continues to use shrinkers to manage swelling. He performs strength training exercises, including ankle weights and sit-to-stand movements, but has difficulty rising from lower chairs  without upper extremity assistance.  He follows regularly with an infectious disease specialist for ongoing monitoring of his lower extremities. His wife assists with wound management. He denies fever, chills, or sweats.     Assessment & Plan: Visit Diagnoses:  1. Below-knee amputation of left lower extremity with complication, sequela   2. Skin ulcer of multiple sites of left lower extremity, limited to breakdown of skin (HCC)   3. Below-knee amputation of right lower extremity, initial encounter [facility])     Plan: Assessment and Plan Assessment & Plan Chronic ulcer of right lower extremity Distal lateral ulcer nearly healed. New ulcer on plantar surface of right foot. Current wound care effective. - Continue cleaning ulcer with Edrick as directed. - Continue wearing black shrinker at night. - Follow-up in two months unless new symptoms arise.  Acquired absence of lower limb Ambulates with prosthesis, experiencing patellar pressure on left. Prosthetic adjustment needed. Ongoing management essential for function and complication prevention. - Follow up with [facility] today for prosthesis refitting and adjustment. - Continue physical therapy and ambulation as tolerated.  Phantom limb pain Nocturnal phantom limb pain persists. Escalation of gabapentin  or pregabalin  not recommended due to side effects. Current management appropriate. - Continue current management for phantom limb pain. Avoid increasing gabapentin  or pregabalin  dosage.  Varicose veins of lower extremities Varicose veins present with concern for swelling. Nighttime stump shrinkers recommended to prevent edema. Strengthening exercises encouraged. - Continue wearing stump shrinkers at night indefinitely  to prevent swelling. - Encouraged strength training for quadriceps and hamstrings via sit-to-stand exercises from progressively lower chairs without hand assistance.      Follow-Up Instructions: No follow-ups on file.    Ortho Exam  Patient is alert, oriented, no adenopathy, well-dressed, normal affect, normal respiratory effort. Physical Exam EXTREMITIES: Pressure on left patella from subsiding into socket. Right lower extremity distal lateral ulcer almost healed. Legs appear improved. No dermatitis, cellulitis, or drainage in legs. No swelling in legs. Varicose veins present in both legs.      Imaging: No results found. No images are attached to the encounter.  Labs: Lab Results  Component Value Date   HGBA1C 4.9 10/29/2023   HGBA1C 5.0 05/17/2022   HGBA1C 5.5 05/04/2021   ESRSEDRATE 28 (H) 02/18/2024   ESRSEDRATE 34 (H) 01/24/2020   ESRSEDRATE 41 (H) 01/10/2020   CRP 24.8 (H) 02/18/2024   CRP 8.8 (H) 01/24/2020   CRP 35.9 (H) 01/10/2020   LABURIC 4.2 01/09/2024   REPTSTATUS 03/17/2024 FINAL 03/17/2024   GRAMSTAIN  03/17/2024    RARE WBC PRESENT, PREDOMINANTLY PMN NO ORGANISMS SEEN Performed at [facility] Lab, [address] [place], [place] [address]    CULT  03/16/2024    NO GROWTH 5 DAYS Performed at [facility], [address] [place], [place] [address]    [facility] STAPHYLOCOCCUS AUREUS 01/11/2024     Lab Results  Component Value Date   ALBUMIN  2.2 (L) 03/17/2024   ALBUMIN  2.7 (L) 03/16/2024   ALBUMIN  2.7 (L) 03/16/2024   PREALBUMIN 32.7 05/09/2021   PREALBUMIN 14.8 (L) 12/14/2018    Lab Results  Component Value Date   MG 2.0 01/15/2024   MG 2.0 01/13/2024   MG 2.2 01/12/2024   Lab Results  Component Value Date   VD25OH 42.0 11/19/2021   VD25OH 16.13 (L) 05/09/2021    Lab Results  Component Value Date   PREALBUMIN 32.7 05/09/2021   PREALBUMIN 14.8 (L) 12/14/2018      Latest Ref Rng & Units 03/17/2024    8:14 PM 03/16/2024   11:09 PM 03/16/2024    5:42 PM  CBC EXTENDED  WBC 4.0 - 10.5 K/uL 4.9  5.3    RBC 4.22 - 5.81 MIL/uL 3.69  4.20    Hemoglobin 13.0 - 17.0 g/dL 89.2  87.9  87.3   HCT 39.0 - 52.0 % 32.9  38.0  37.0   Platelets 150 - 400  K/uL 127  130    NEUT# 1.7 - 7.7 K/uL 3.5     Lymph# 0.7 - 4.0 K/uL 0.4        There is no height or weight on file to calculate BMI.  Orders:  No orders of the defined types were placed in this encounter.  No orders of the defined types were placed in this encounter.    Procedures: No procedures performed  Clinical Data: No additional findings.  ROS:  All other systems negative, except as noted in the HPI. Review of Systems  Objective: Vital Signs: There were no vitals taken for this visit.  Specialty Comments:  No specialty comments available.  PMFS History: Patient Active Problem List   Diagnosis Date Noted   Amputation stump infection (HCC) 03/17/2024   Fluid overload 03/16/2024   Diarrhea 03/16/2024   SIRS (systemic inflammatory response syndrome) (HCC) 01/08/2024   Hypotension 01/08/2024   (HFpEF) heart failure with preserved ejection fraction (HCC) 01/08/2024   Right knee pain 01/08/2024   Gastroesophageal reflux disease  without esophagitis 12/22/2023   CAP (community acquired pneumonia) 10/29/2023   Acute respiratory failure with hypoxia (HCC) 05/05/2023   Ascites 05/05/2023   Hyponatremia 05/05/2023   Right below-knee amputee (HCC) 07/29/2022   Sepsis due to pneumonia (HCC) 07/23/2022   Foot infection 07/22/2022   Acute respiratory failure (HCC) 07/22/2022   Anemia due to chronic blood loss 06/23/2022   Heme positive stool 06/23/2022   Benign neoplasm of cecum 06/23/2022   Benign neoplasm of transverse colon 06/23/2022   Benign neoplasm of descending colon 06/23/2022   Abscess of right foot 05/17/2022   Contraindication to anticoagulation therapy GU Bleed 03/13/2022   Permanent atrial fibrillation (HCC) 08/18/2021   Anemia of chronic renal failure 08/18/2021   Thrombocytopenia 08/18/2021   Encephalopathy 08/18/2021   Action induced myoclonus 08/18/2021   Dehiscence of amputation stump of left lower extremity (HCC)    Left below-knee amputee  (HCC) 05/11/2021   Anemia of chronic disease 09/13/2020   COVID-19 virus infection 09/13/2020   Pressure injury of skin 06/29/2020   Gross hematuria 06/16/2020   Typical atrial flutter (HCC) 03/24/2020   Bilateral pleural effusion 02/24/2020   Healthcare maintenance 01/28/2020   Shortness of breath 01/28/2020   History of partial ray amputation of fourth toe of right foot 09/08/2019   Chronic cough 06/25/2019   Cutaneous abscess of right foot    Subacute osteomyelitis, right ankle and foot (HCC) 12/14/2018   AKI (acute kidney injury) 12/14/2018   ESRD on dialysis (HCC) 12/14/2018   S/P CABG (coronary artery bypass graft) 12/11/2018   Gait abnormality 08/24/2018   Diabetic neuropathy (HCC) 02/06/2018   Cervical myelopathy (HCC) 02/06/2018   Onychomycosis 10/30/2017   Other spondylosis with radiculopathy, cervical region 01/27/2017   Midfoot ulcer, right, limited to breakdown of skin (HCC) 11/15/2016   Lateral epicondylitis, left elbow 08/12/2016   Prostate cancer (HCC) 06/19/2016   Cellulitis of right foot 12/24/2015   Gout 09/14/2012   Essential hypertension 09/14/2012   Hypothyroidism 09/14/2012   CAD (coronary artery disease) 09/14/2012   Insulin  dependent type 2 diabetes mellitus (HCC) 09/14/2012   Hyperlipidemia associated with type 2 diabetes mellitus (HCC)    Neuropathy (HCC)    Past Medical History:  Diagnosis Date   Acute osteomyelitis of metatarsal bone of left foot (HCC)    Ambulates with cane    straight cane   Anemia    Cervical myelopathy (HCC) 02/06/2018   Chronic kidney disease    dailysis [facility]- home   Community acquired pneumonia of left lower lobe of lung 08/18/2021   Complication of anesthesia    Coronary artery disease    Dehiscence of amputation stump of left lower extremity (HCC)    Diabetes mellitus without complication (HCC)    type2   Diabetic foot ulcer (HCC)    Diabetic neuropathy (HCC) 02/06/2018   Dysrhythmia    Gait abnormality  08/24/2018   GERD (gastroesophageal reflux disease)     01/06/20- not current   Gout    History of blood transfusion    History of blood transfusion    History of kidney stones    passed stones   Hypercholesteremia    Hypertension    Hypothyroidism    Neuromuscular disorder (HCC)    neuropathy left leg and bilateral feet   Neuropathy    Partial nontraumatic amputation of foot, 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   PONV (postoperative nausea and vomiting)    Prostate cancer (HCC)    PVD (peripheral vascular disease)  with amputations    Family History  Problem Relation Age of Onset   Diabetes Mellitus II Mother    Kidney disease Mother    Diabetes Mellitus II Father    CAD Father    Cancer Father        prostate   Kidney disease Father    Diabetes Mellitus II Brother    Kidney disease Brother    Diabetes Mellitus II Brother    Stomach cancer Brother 19   Kidney disease Brother    Colon cancer Neg Hx    Colon polyps Neg Hx    Esophageal cancer Neg Hx    Rectal cancer Neg Hx    Pancreatic cancer Neg Hx     Past Surgical History:  Procedure Laterality Date   A-FLUTTER ABLATION N/A 04/06/2020   Procedure: A-FLUTTER ABLATION;  Surgeon: Waddell Danelle ORN, MD;  Location: [facility] INVASIVE CV LAB;  Service: Cardiovascular;  Laterality: N/A;   A/V FISTULAGRAM N/A 03/05/2023   Procedure: A/V Fistulagram;  Surgeon: Pearline [name] RAMAN, MD;  Location: [facility] INVASIVE CV LAB;  Service: Vascular;  Laterality: N/A;   A/V SHUNT INTERVENTION Left 11/07/2023   Procedure: A/V SHUNT INTERVENTION;  Surgeon: Gretta Lonni PARAS, MD;  Location: [facility] PV LAB;  Service: Cardiovascular;  Laterality: Left;   AMPUTATION Left 12/25/2013   Procedure: AMPUTATION RAY LEFT 5TH RAY;  Surgeon: Jerona Harden GAILS, MD;  Location: [facility] ORS;  Service: Orthopedics;  Laterality: Left;   AMPUTATION Right 12/15/2018   Procedure: AMPUTATION OF 4TH AND 5TH TOES RIGHT FOOT;  Surgeon: Harden Jerona GAILS, MD;  Location: [facility] OR;  Service: Orthopedics;   Laterality: Right;   AMPUTATION Left 02/02/2021   Procedure: LEFT FOOT 4TH RAY AMPUTATION;  Surgeon: Harden Jerona GAILS, MD;  Location: [facility] OR;  Service: Orthopedics;  Laterality: Left;   AMPUTATION Left 05/09/2021   Procedure: LEFT BELOW KNEE AMPUTATION;  Surgeon: Harden Jerona GAILS, MD;  Location: [facility] OR;  Service: Orthopedics;  Laterality: Left;   AMPUTATION Right 07/26/2022   Procedure: RIGHT BELOW KNEE AMPUTATION;  Surgeon: Harden Jerona GAILS, MD;  Location: [facility] OR;  Service: Orthopedics;  Laterality: Right;   APPLICATION OF WOUND VAC Right 12/15/2018   Procedure: APPLICATION OF WOUND VAC;  Surgeon: Harden Jerona GAILS, MD;  Location: [facility] OR;  Service: Orthopedics;  Laterality: Right;   AV FISTULA PLACEMENT Left 03/25/2019   Procedure: LEFT ARM ARTERIOVENOUS (AV) FISTULA CREATION;  Surgeon: Serene Gaile ORN, MD;  Location: [facility] OR;  Service: Vascular;  Laterality: Left;   BACK SURGERY     BASCILIC VEIN TRANSPOSITION Left 05/20/2019   Procedure: SECOND STAGE LEFT BASCILIC VEIN TRANSPOSITION;  Surgeon: Serene Gaile ORN, MD;  Location: [facility] OR;  Service: Vascular;  Laterality: Left;   BIOPSY  06/22/2022   Procedure: BIOPSY;  Surgeon: Wilhelmenia Aloha Raddle., MD;  Location: [facility] ENDOSCOPY;  Service: Gastroenterology;;   CARDIAC CATHETERIZATION  02/17/2014   CATARACT EXTRACTION Bilateral    CHOLECYSTECTOMY     COLONOSCOPY  2011   [place], Normal   COLONOSCOPY N/A 06/23/2022   Procedure: COLONOSCOPY;  Surgeon: Legrand Victory LITTIE DOUGLAS, MD;  Location: [facility] ENDOSCOPY;  Service: Gastroenterology;  Laterality: N/A;   CORONARY ARTERY BYPASS GRAFT  2008   CYSTOSCOPY N/A 06/29/2020   Procedure: CYSTOSCOPY WITH CLOT EVACUATION AND  FULGERATION;  Surgeon: Matilda Senior, MD;  Location: [facility] OR;  Service: Urology;  Laterality: N/A;   CYSTOSCOPY WITH FULGERATION Bilateral 06/17/2020   Procedure: CYSTOSCOPY,BILATERAL RETROGRADE, CLOT EVACUATION WITH FULGERATION OF  THE BLADDER;  Surgeon: Selma Donnice SAUNDERS, MD;  Location: [facility] OR;   Service: Urology;  Laterality: Bilateral;   CYSTOSCOPY WITH FULGERATION N/A 06/28/2020   Procedure: CYSTOSCOPY WITH CLOT EVACUATION AND FULGERATION OF BLEEDERS;  Surgeon: Matilda Senior, MD;  Location: [facility] OR;  Service: Urology;  Laterality: N/A;  1 HR   ENTEROSCOPY N/A 06/22/2022   Procedure: ENTEROSCOPY;  Surgeon: Wilhelmenia Aloha Raddle., MD;  Location: [facility] ENDOSCOPY;  Service: Gastroenterology;  Laterality: N/A;   HEMOSTASIS CLIP PLACEMENT  06/22/2022   Procedure: HEMOSTASIS CLIP PLACEMENT;  Surgeon: Wilhelmenia Aloha Raddle., MD;  Location: [facility] ENDOSCOPY;  Service: Gastroenterology;;   HOT HEMOSTASIS N/A 06/22/2022   Procedure: HOT HEMOSTASIS (ARGON PLASMA COAGULATION/BICAP);  Surgeon: Wilhelmenia Aloha Raddle., MD;  Location: [facility] ENDOSCOPY;  Service: Gastroenterology;  Laterality: N/A;   I & D EXTREMITY Right 12/15/2018   Procedure: DEBRIDEMENT RIGHT FOOT;  Surgeon: Harden Jerona GAILS, MD;  Location: [facility] OR;  Service: Orthopedics;  Laterality: Right;   I & D EXTREMITY Right 08/27/2019   Procedure: PARTIAL CUBOID EXCISION RIGHT FOOT;  Surgeon: Harden Jerona GAILS, MD;  Location: [facility] OR;  Service: Orthopedics;  Laterality: Right;   I & D EXTREMITY Right 01/07/2020   Procedure: RIGHT FOOT EXCISION INFECTED BONE;  Surgeon: Harden Jerona GAILS, MD;  Location: [facility] OR;  Service: Orthopedics;  Laterality: Right;   I & D EXTREMITY Right 05/17/2022   Procedure: IRRIGATION AND DEBRIDEMENT RIGHT FOOT ABSCESS;  Surgeon: Harden Jerona GAILS, MD;  Location: [facility] OR;  Service: Orthopedics;  Laterality: Right;   IR PARACENTESIS  10/11/2022   IR PARACENTESIS  12/24/2022   IR PARACENTESIS  02/06/2023   IR PARACENTESIS  05/05/2023   IR PARACENTESIS  05/19/2023   IR PARACENTESIS  07/11/2023   IR PARACENTESIS  07/30/2023   IR PARACENTESIS  08/20/2023   IR PARACENTESIS  09/19/2023   IR PARACENTESIS  10/03/2023   IR PARACENTESIS  10/17/2023   IR PARACENTESIS  10/30/2023   IR PARACENTESIS  11/19/2023   IR PARACENTESIS  12/03/2023   IR  PARACENTESIS  12/16/2023   IR PARACENTESIS  01/12/2024   IR PARACENTESIS  02/06/2024   IR PARACENTESIS  02/20/2024   IR PARACENTESIS  03/03/2024   IR PARACENTESIS  03/17/2024   IR PARACENTESIS  03/30/2024   IR THORACENTESIS RIGHT ASP PLEURAL SPACE W/IMG GUIDE  03/18/2024   NECK SURGERY     novemver 2019   PERIPHERAL VASCULAR BALLOON ANGIOPLASTY Left 03/05/2023   Procedure: PERIPHERAL VASCULAR BALLOON ANGIOPLASTY;  Surgeon: Pearline [name] RAMAN, MD;  Location: [facility] INVASIVE CV LAB;  Service: Vascular;  Laterality: Left;  AVF   POLYPECTOMY  06/22/2022   Procedure: POLYPECTOMY;  Surgeon: Mansouraty, Aloha Raddle., MD;  Location: [facility] ENDOSCOPY;  Service: Gastroenterology;;   POLYPECTOMY  06/23/2022   Procedure: POLYPECTOMY;  Surgeon: Legrand Victory LITTIE DOUGLAS, MD;  Location: [facility] ENDOSCOPY;  Service: Gastroenterology;;   STUMP REVISION Left 06/27/2021   Procedure: REVISION LEFT BELOW KNEE AMPUTATION;  Surgeon: Harden Jerona GAILS, MD;  Location: [facility] OR;  Service: Orthopedics;  Laterality: Left;   SUBMUCOSAL LIFTING INJECTION  06/22/2022   Procedure: SUBMUCOSAL LIFTING INJECTION;  Surgeon: Wilhelmenia Aloha Raddle., MD;  Location: [facility] ENDOSCOPY;  Service: Gastroenterology;;   SUBMUCOSAL TATTOO INJECTION  06/22/2022   Procedure: SUBMUCOSAL TATTOO INJECTION;  Surgeon: Wilhelmenia Aloha Raddle., MD;  Location: [facility] ENDOSCOPY;  Service: Gastroenterology;;   TRANSURETHRAL RESECTION OF PROSTATE N/A 06/28/2020   Procedure: TRANSURETHRAL RESECTION OF THE PROSTATE (TURP);  Surgeon: Matilda Senior, MD;  Location: [facility] OR;  Service: Urology;  Laterality: N/A;   VENOUS ANGIOPLASTY  11/07/2023   Procedure: VENOUS ANGIOPLASTY;  Surgeon: Gretta Lonni PARAS, MD;  Location: [facility] LAB;  Service: Cardiovascular;;  Basilic Vein, Subclavian Vein   WISDOM TOOTH EXTRACTION     Social History   Occupational History   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  Tobacco Use   Smoking status: Former    Types: Cigars    Start date: 04/08/1973    Quit  date: 12/24/1988    Years since quitting: 35.3   Smokeless tobacco: Never   Tobacco comments:    Cigars and Pipe   Vaping Use   Vaping status: Never Used  Substance and Sexual Activity   Alcohol  use: Never   Drug use: Never   Sexual activity: Yes    Partners: Female         "

## 2024-04-22 ENCOUNTER — Encounter: Payer: Self-pay | Admitting: Family Medicine

## 2024-04-22 ENCOUNTER — Ambulatory Visit

## 2024-04-22 ENCOUNTER — Other Ambulatory Visit: Payer: Self-pay | Admitting: Physical Medicine and Rehabilitation

## 2024-04-22 ENCOUNTER — Ambulatory Visit: Payer: Self-pay

## 2024-04-22 ENCOUNTER — Ambulatory Visit: Admitting: Family Medicine

## 2024-04-22 ENCOUNTER — Ambulatory Visit: Payer: Self-pay | Admitting: Family Medicine

## 2024-04-22 VITALS — BP 126/82 | HR 60 | Temp 98.1°F | Ht 71.0 in | Wt 198.0 lb

## 2024-04-22 DIAGNOSIS — R051 Acute cough: Secondary | ICD-10-CM | POA: Diagnosis not present

## 2024-04-22 DIAGNOSIS — D649 Anemia, unspecified: Secondary | ICD-10-CM | POA: Diagnosis not present

## 2024-04-22 DIAGNOSIS — R7989 Other specified abnormal findings of blood chemistry: Secondary | ICD-10-CM | POA: Diagnosis not present

## 2024-04-22 DIAGNOSIS — J90 Pleural effusion, not elsewhere classified: Secondary | ICD-10-CM

## 2024-04-22 DIAGNOSIS — R799 Abnormal finding of blood chemistry, unspecified: Secondary | ICD-10-CM

## 2024-04-22 DIAGNOSIS — G629 Polyneuropathy, unspecified: Secondary | ICD-10-CM

## 2024-04-22 DIAGNOSIS — E039 Hypothyroidism, unspecified: Secondary | ICD-10-CM | POA: Diagnosis not present

## 2024-04-22 DIAGNOSIS — Z09 Encounter for follow-up examination after completed treatment for conditions other than malignant neoplasm: Secondary | ICD-10-CM | POA: Diagnosis not present

## 2024-04-22 LAB — CBC WITH DIFFERENTIAL/PLATELET
Basophils Absolute: 0.1 K/uL (ref 0.0–0.1)
Basophils Relative: 2.1 % (ref 0.0–3.0)
Eosinophils Absolute: 0.2 K/uL (ref 0.0–0.7)
Eosinophils Relative: 3.2 % (ref 0.0–5.0)
HCT: 36.8 % — ABNORMAL LOW (ref 39.0–52.0)
Hemoglobin: 12.4 g/dL — ABNORMAL LOW (ref 13.0–17.0)
Lymphocytes Relative: 10 % — ABNORMAL LOW (ref 12.0–46.0)
Lymphs Abs: 0.5 K/uL — ABNORMAL LOW (ref 0.7–4.0)
MCHC: 33.6 g/dL (ref 30.0–36.0)
MCV: 87.8 fl (ref 78.0–100.0)
Monocytes Absolute: 0.6 K/uL (ref 0.1–1.0)
Monocytes Relative: 12.8 % — ABNORMAL HIGH (ref 3.0–12.0)
Neutro Abs: 3.4 K/uL (ref 1.4–7.7)
Neutrophils Relative %: 71.9 % (ref 43.0–77.0)
Platelets: 87 K/uL — ABNORMAL LOW (ref 150.0–400.0)
RBC: 4.2 Mil/uL — ABNORMAL LOW (ref 4.22–5.81)
RDW: 16.4 % — ABNORMAL HIGH (ref 11.5–15.5)
WBC: 4.8 K/uL (ref 4.0–10.5)

## 2024-04-22 LAB — COMPREHENSIVE METABOLIC PANEL WITH GFR
ALT: 7 U/L (ref 3–53)
AST: 18 U/L (ref 5–37)
Albumin: 3.5 g/dL (ref 3.5–5.2)
Alkaline Phosphatase: 126 U/L — ABNORMAL HIGH (ref 39–117)
BUN: 20 mg/dL (ref 6–23)
CO2: 31 meq/L (ref 19–32)
Calcium: 8.8 mg/dL (ref 8.4–10.5)
Chloride: 100 meq/L (ref 96–112)
Creatinine, Ser: 4.38 mg/dL — ABNORMAL HIGH (ref 0.40–1.50)
GFR: 12.58 mL/min — CL
Glucose, Bld: 89 mg/dL (ref 70–99)
Potassium: 4.3 meq/L (ref 3.5–5.1)
Sodium: 143 meq/L (ref 135–145)
Total Bilirubin: 0.9 mg/dL (ref 0.2–1.2)
Total Protein: 5.3 g/dL — ABNORMAL LOW (ref 6.0–8.3)

## 2024-04-22 LAB — TSH: TSH: 3.96 u[IU]/mL (ref 0.35–5.50)

## 2024-04-22 MED ORDER — BENZONATATE 200 MG PO CAPS: 200.0000 mg | ORAL_CAPSULE | Freq: Three times a day (TID) | ORAL | 0 refills | Status: AC | PRN

## 2024-04-22 NOTE — Telephone Encounter (Addendum)
 Dr. Amon - on call returned our call.  Shared critical lab result of GFR 12.58.        FYI Only or Action Required?: Action required by provider: Critical lab results  GFR 12.58.  Patient was last seen in primary care on 04/22/2024 by [name], NP.  Called Nurse Triage reporting Advice Only.   Triage Disposition: No disposition on file.  Called on cal and requested a call back.  Patient/caregiver understands and will follow disposition?:                   Copied from CRM [id number]. Topic: Clinical - Lab/Test Results >> Apr 22, 2024  5:17 PM [name] wrote: Reason for CRM: shaniqua called because she has critical lab results Answer Assessment - Initial Assessment Questions 1. REASON FOR CALL or QUESTION: What is your reason for calling today? or How can I best     Critical lab result 2. CALLER: Document the source of call. (e.g., laboratory staff, caregiver or patient).     Lab staff  Answer Assessment - Initial Assessment Questions 1. REASON FOR CALL: What is the main reason for your call? or How can I best help you?     Critical lab results from [facility]  Protocols used: PCP Call - No Triage-A-AH, Information Only Call - No Triage-A-AH

## 2024-04-22 NOTE — Progress Notes (Signed)
 "  Established Patient Office Visit   Subjective:  Patient ID: [name], male    DOB: 07-Sep-1950  Age: 74 y.o. MRN: [id number]  Chief Complaint  Patient presents with   Hospitalization Follow-up    HPI Patient is present for a hospital follow up. He was admitted at [facility] 03/16/2024 through 03/18/2024. He presented with increase weakness, fatigue, and watery diarrhea. Prior to that he was admitted back in October 2025 for right leg septic bursitis of the right knee.   His CT abd/pelvis showed large ascites and moderate right pleural effusion. He is in permanent A-fib with a rate controlled. On exam, patients left stump of BKA appeared erythematous and also has a small skin bleb which is had some discharge. Right stump looked mildly erythematous. Patient was admitted for fluid overload likely from third spacing increasing fatigue and weakness from deconditioning.   He received paracentesis, thoracentesis, and dialysis while admitted. He has dialysis at the center on Tuesday, Thursday and Saturday. Orthopedic seen patient for left stump cellulitis with follow up outpatient.   TSH was elevated at 6.4, but was held on increasing Synthroid  to outpatient.   Since discharge, patient has followed up with Dr. Jerona Sage with [facility] for left lower extremity with complication, skin ulcer of multiple sites of left lower extremity, and right BKA.   Not receiving physical therapy at home, but goes to [facility] with Dr. Kruitka Raulkar.   He has appointment tomorrow for Interventional Radiology for paracentesis.   Also, has a non productive cough. He is uncertain when the cough exactly started. He reports he felt the best right after paracentesis. Denies chest pain, but reports he SHOB. More than usual and more on exertion.   ROS See HPI above     Objective:   BP 126/82   Pulse 60   Temp 98.1 F (36.7 C) (Oral)   Ht 5'  11 (1.803 m)   Wt 198 lb (89.8 kg)   SpO2 95%   BMI 27.62 kg/m    Physical Exam Vitals reviewed.  Constitutional:      General: He is not in acute distress.    Appearance: Normal appearance. He is ill-appearing (Mild). He is not toxic-appearing or diaphoretic.  HENT:     Head: Normocephalic and atraumatic.  Eyes:     General:        Right eye: No discharge.        Left eye: No discharge.     Conjunctiva/sclera: Conjunctivae normal.  Cardiovascular:     Rate and Rhythm: Normal rate and regular rhythm.     Heart sounds: Normal heart sounds. No murmur heard.    No friction rub. No gallop.  Pulmonary:     Effort: Pulmonary effort is normal. No respiratory distress.     Breath sounds: Normal breath sounds.     Comments: Non productive cough during visit  Musculoskeletal:        General: Normal range of motion.     Right Lower Extremity: Right leg is amputated below knee.     Left Lower Extremity: Left leg is amputated below knee.  Skin:    General: Skin is warm and dry.  Neurological:     General: No focal deficit present.     Mental Status: He is alert and oriented to person, place, and time. Mental status is at baseline.     Gait: Gait abnormal.  Psychiatric:  Mood and Affect: Mood normal.        Behavior: Behavior normal.        Thought Content: Thought content normal.        Judgment: Judgment normal.    The 10-year ASCVD risk score (Arnett DK, et al., 2019) is: 40.1%    Assessment & Plan:  Hospital discharge follow-up -     CBC with Differential/Platelet -     Comprehensive metabolic panel with GFR  Hypothyroidism, unspecified type -     TSH  Acute cough -     DG Chest 2 View; Future -     Benzonatate ; Take 1 capsule (200 mg total) by mouth 3 (three) times daily as needed for up to 7 days.  Dispense: 20 capsule; Refill: 0  Abnormal CBC -     CBC with Differential/Platelet  Abnormal blood chemistry -     Comprehensive metabolic panel with  GFR  Anemia, unspecified type -     CBC with Differential/Platelet  1.Review health maintenance:  -Zoster vaccine: Not had  -Tdap: Local pharmacy may have  -Covid vaccine: Fall 2025  -Ophthalmology: [facility]  2.Ordered labs based on recommendation from discharge hospitalist and abnormal findings on chemistry and CBC.  Office will call with lab results and will be available via MyChart.  3.Continue all prescribed medication. 4.Ordered chest xray for cough. Office will call with lab results and will be available via MyChart.  5.Prescribed Benzonatate  200mg  capsule to take every 8 hours as needed for cough.  6. Follow up based on lab levels.   Tayler Heiden, NP "

## 2024-04-22 NOTE — Patient Instructions (Addendum)
-  It was good to see you today and glad you are improving since hospital discharge. -Ordered labs. Office will call with lab results and will be available via MyChart.  -Continue all prescribed medication. -Ordered chest xray for cough. Office will call with lab results and will be available via MyChart.  -Prescribed Benzonatate  200mg  capsule to take every 8 hours as needed for cough.  -Follow up based on lab levels.

## 2024-04-22 NOTE — Telephone Encounter (Signed)
 Pt is on HD

## 2024-04-23 ENCOUNTER — Ambulatory Visit (HOSPITAL_COMMUNITY)
Admission: RE | Admit: 2024-04-23 | Discharge: 2024-04-23 | Disposition: A | Discharge status: Home / Self Care | Source: Ambulatory Visit | Attending: Nephrology | Admitting: Nephrology

## 2024-04-23 ENCOUNTER — Other Ambulatory Visit (HOSPITAL_COMMUNITY): Payer: Self-pay | Admitting: Nephrology

## 2024-04-23 DIAGNOSIS — N186 End stage renal disease: Secondary | ICD-10-CM | POA: Diagnosis present

## 2024-04-23 DIAGNOSIS — R188 Other ascites: Secondary | ICD-10-CM | POA: Insufficient documentation

## 2024-04-23 HISTORY — PX: IR PARACENTESIS: IMG2679

## 2024-04-23 MED ORDER — LIDOCAINE-EPINEPHRINE 1 %-1:100000 IJ SOLN
INTRAMUSCULAR | Status: AC
  Filled 2024-04-23: qty 20

## 2024-04-23 MED ORDER — LIDOCAINE-EPINEPHRINE 1 %-1:100000 IJ SOLN
20.0000 mL | Freq: Once | INTRAMUSCULAR | Status: AC
  Administered 2024-04-23: 10 mL

## 2024-04-23 NOTE — Procedures (Signed)
 PROCEDURE SUMMARY:  Successful image-guided paracentesis from the right lower abdomen.  Yielded 4.7 liters of cloudy amber fluid.  No immediate complications.  EBL: zero Patient tolerated well.   Specimen not sent for labs.  Please see imaging section of Epic for full dictation.  Archit Leger B Quetzalli Clos NP 04/23/2024 3:25 PM

## 2024-04-27 ENCOUNTER — Ambulatory Visit: Payer: Self-pay | Admitting: Family Medicine

## 2024-04-27 ENCOUNTER — Ambulatory Visit (HOSPITAL_COMMUNITY)
Admission: RE | Admit: 2024-04-27 | Discharge: 2024-04-27 | Disposition: A | Source: Ambulatory Visit | Attending: Family Medicine

## 2024-04-27 ENCOUNTER — Ambulatory Visit (HOSPITAL_COMMUNITY): Admission: RE | Admit: 2024-04-27 | Discharge: 2024-04-27 | Disposition: A | Source: Ambulatory Visit

## 2024-04-27 DIAGNOSIS — J90 Pleural effusion, not elsewhere classified: Secondary | ICD-10-CM | POA: Insufficient documentation

## 2024-04-27 HISTORY — PX: IR THORACENTESIS RIGHT ASP PLEURAL SPACE W/IMG GUIDE: IMG5380

## 2024-04-27 MED ORDER — LIDOCAINE-EPINEPHRINE 1 %-1:100000 IJ SOLN
INTRAMUSCULAR | Status: AC
  Filled 2024-04-27: qty 20

## 2024-04-27 MED ORDER — LIDOCAINE-EPINEPHRINE 1 %-1:100000 IJ SOLN
20.0000 mL | Freq: Once | INTRAMUSCULAR | Status: AC
  Administered 2024-04-27: 10 mL via INTRADERMAL

## 2024-04-27 NOTE — Procedures (Signed)
 PROCEDURE SUMMARY:  Successful image-guided diagnostic and therapeutic thoracentesis from the right chest.  Yielded 1.5 liters of clear, yellow fluid.  No immediate complications.  EBL: zero Patient tolerated well.   Specimen not sent for labs.  Post-procedure CXR ordered and reviewed prior to departure from department.   Please see imaging section of Epic for full dictation.  Kiamesha Samet NP 04/27/2024 3:21 PM

## 2024-05-07 ENCOUNTER — Ambulatory Visit (HOSPITAL_COMMUNITY)
Admission: RE | Admit: 2024-05-07 | Discharge: 2024-05-07 | Disposition: A | Source: Ambulatory Visit | Attending: Nephrology | Admitting: Nephrology

## 2024-05-07 DIAGNOSIS — R188 Other ascites: Secondary | ICD-10-CM | POA: Insufficient documentation

## 2024-05-07 MED ORDER — LIDOCAINE-EPINEPHRINE 1 %-1:100000 IJ SOLN
10.0000 mL | Freq: Once | INTRAMUSCULAR | Status: AC
  Administered 2024-05-07: 10 mL via INTRADERMAL

## 2024-05-07 MED ORDER — LIDOCAINE-EPINEPHRINE 1 %-1:100000 IJ SOLN
INTRAMUSCULAR | Status: AC
  Filled 2024-05-07: qty 20

## 2024-05-07 NOTE — Procedures (Signed)
 PROCEDURE SUMMARY:  Successful US  guided paracentesis from left lateral abdomen.  Yielded 3.7 liters of clear, yellow fluid.  No immediate complications.  Pt tolerated well.   Specimen was sent for labs.  EBL < 5mL  Solmon Selmer Ku PA-C 05/07/2024 2:14 PM

## 2024-05-11 ENCOUNTER — Encounter (HOSPITAL_COMMUNITY): Payer: Self-pay

## 2024-05-31 ENCOUNTER — Ambulatory Visit

## 2024-06-02 ENCOUNTER — Encounter (HOSPITAL_BASED_OUTPATIENT_CLINIC_OR_DEPARTMENT_OTHER): Admitting: Internal Medicine

## 2024-06-14 ENCOUNTER — Ambulatory Visit: Admitting: Orthopedic Surgery

## 2024-06-18 ENCOUNTER — Encounter: Admitting: Physical Medicine and Rehabilitation

## 2024-06-21 ENCOUNTER — Ambulatory Visit: Admitting: Physical Medicine and Rehabilitation
